# Patient Record
Sex: Male | Born: 1955
Health system: Southern US, Community
[De-identification: ages and names within clinical notes are randomized; demographics above are authoritative.]

## PROBLEM LIST (undated history)

## (undated) DIAGNOSIS — I350 Nonrheumatic aortic (valve) stenosis: Secondary | ICD-10-CM

## (undated) DIAGNOSIS — G4733 Obstructive sleep apnea (adult) (pediatric): Secondary | ICD-10-CM

## (undated) DIAGNOSIS — I2510 Atherosclerotic heart disease of native coronary artery without angina pectoris: Secondary | ICD-10-CM

## (undated) DIAGNOSIS — R0602 Shortness of breath: Secondary | ICD-10-CM

## (undated) DIAGNOSIS — F4310 Post-traumatic stress disorder, unspecified: Secondary | ICD-10-CM

## (undated) DIAGNOSIS — E7800 Pure hypercholesterolemia, unspecified: Secondary | ICD-10-CM

## (undated) DIAGNOSIS — F32A Depression, unspecified: Secondary | ICD-10-CM

## (undated) DIAGNOSIS — E119 Type 2 diabetes mellitus without complications: Secondary | ICD-10-CM

## (undated) DIAGNOSIS — I10 Essential (primary) hypertension: Secondary | ICD-10-CM

## (undated) DIAGNOSIS — F329 Major depressive disorder, single episode, unspecified: Secondary | ICD-10-CM

## (undated) DIAGNOSIS — G4730 Sleep apnea, unspecified: Secondary | ICD-10-CM

## (undated) DIAGNOSIS — R053 Chronic cough: Secondary | ICD-10-CM

## (undated) DIAGNOSIS — J449 Chronic obstructive pulmonary disease, unspecified: Secondary | ICD-10-CM

## (undated) DIAGNOSIS — F419 Anxiety disorder, unspecified: Secondary | ICD-10-CM

## (undated) DIAGNOSIS — I77819 Aortic ectasia, unspecified site: Secondary | ICD-10-CM

## (undated) DIAGNOSIS — R05 Cough: Secondary | ICD-10-CM

## (undated) DIAGNOSIS — M1990 Unspecified osteoarthritis, unspecified site: Secondary | ICD-10-CM

## (undated) DIAGNOSIS — D649 Anemia, unspecified: Secondary | ICD-10-CM

## (undated) DIAGNOSIS — J45909 Unspecified asthma, uncomplicated: Secondary | ICD-10-CM

## (undated) DIAGNOSIS — R079 Chest pain, unspecified: Secondary | ICD-10-CM

## (undated) DIAGNOSIS — D126 Benign neoplasm of colon, unspecified: Secondary | ICD-10-CM

## (undated) DIAGNOSIS — I5032 Chronic diastolic (congestive) heart failure: Secondary | ICD-10-CM

## (undated) DIAGNOSIS — L03119 Cellulitis of unspecified part of limb: Secondary | ICD-10-CM

## (undated) DIAGNOSIS — L03115 Cellulitis of right lower limb: Secondary | ICD-10-CM

## (undated) DIAGNOSIS — Z9889 Other specified postprocedural states: Secondary | ICD-10-CM

## (undated) DIAGNOSIS — I77810 Thoracic aortic ectasia: Secondary | ICD-10-CM

## (undated) DIAGNOSIS — R569 Unspecified convulsions: Secondary | ICD-10-CM

## (undated) HISTORY — DX: Cellulitis of right lower limb: L03.115

## (undated) HISTORY — DX: Cellulitis of unspecified part of limb: L03.119

## (undated) HISTORY — DX: Obstructive sleep apnea (adult) (pediatric): G47.33

## (undated) HISTORY — DX: Unspecified asthma, uncomplicated: J45.909

## (undated) HISTORY — DX: Shortness of breath: R06.02

## (undated) HISTORY — DX: Pure hypercholesterolemia, unspecified: E78.00

## (undated) HISTORY — DX: Anemia, unspecified: D64.9

## (undated) HISTORY — DX: Benign neoplasm of colon, unspecified: D12.6

## (undated) HISTORY — DX: Thoracic aortic ectasia: I77.810

## (undated) HISTORY — PX: UMBILICAL HERNIA REPAIR: SHX196

## (undated) HISTORY — DX: Aortic ectasia, unspecified site: I77.819

## (undated) HISTORY — PX: JOINT REPLACEMENT: SHX530

## (undated) HISTORY — DX: Unspecified convulsions: R56.9

## (undated) HISTORY — DX: Atherosclerotic heart disease of native coronary artery without angina pectoris: I25.10

## (undated) HISTORY — PX: KNEE ARTHROSCOPY: SUR90

## (undated) HISTORY — DX: Chronic obstructive pulmonary disease, unspecified: J44.9

## (undated) HISTORY — DX: Chest pain, unspecified: R07.9

## (undated) HISTORY — DX: Nonrheumatic aortic (valve) stenosis: I35.0

---

## 2005-08-08 DIAGNOSIS — D126 Benign neoplasm of colon, unspecified: Secondary | ICD-10-CM

## 2005-08-08 HISTORY — DX: Benign neoplasm of colon, unspecified: D12.6

## 2014-08-21 ENCOUNTER — Ambulatory Visit
Admission: RE | Admit: 2014-08-21 | Discharge: 2014-08-21 | Disposition: A | Discharge status: Home / Self Care | Payer: BLUE CROSS/BLUE SHIELD | Source: Ambulatory Visit | Attending: Internal Medicine | Admitting: Internal Medicine

## 2014-08-21 ENCOUNTER — Other Ambulatory Visit: Payer: Self-pay | Admitting: Internal Medicine

## 2014-08-21 DIAGNOSIS — M79662 Pain in left lower leg: Secondary | ICD-10-CM

## 2016-08-07 ENCOUNTER — Encounter (HOSPITAL_COMMUNITY): Payer: Self-pay

## 2016-08-07 ENCOUNTER — Emergency Department (HOSPITAL_COMMUNITY)
Admission: EM | Admit: 2016-08-07 | Discharge: 2016-08-07 | Disposition: A | Discharge status: Home / Self Care | Payer: BLUE CROSS/BLUE SHIELD | Attending: Emergency Medicine | Admitting: Emergency Medicine

## 2016-08-07 DIAGNOSIS — I10 Essential (primary) hypertension: Secondary | ICD-10-CM | POA: Insufficient documentation

## 2016-08-07 DIAGNOSIS — Z96652 Presence of left artificial knee joint: Secondary | ICD-10-CM | POA: Diagnosis not present

## 2016-08-07 DIAGNOSIS — R197 Diarrhea, unspecified: Secondary | ICD-10-CM | POA: Diagnosis present

## 2016-08-07 DIAGNOSIS — Z87891 Personal history of nicotine dependence: Secondary | ICD-10-CM | POA: Insufficient documentation

## 2016-08-07 HISTORY — DX: Essential (primary) hypertension: I10

## 2016-08-07 HISTORY — DX: Major depressive disorder, single episode, unspecified: F32.9

## 2016-08-07 HISTORY — DX: Anxiety disorder, unspecified: F41.9

## 2016-08-07 HISTORY — DX: Unspecified osteoarthritis, unspecified site: M19.90

## 2016-08-07 HISTORY — DX: Post-traumatic stress disorder, unspecified: F43.10

## 2016-08-07 HISTORY — DX: Depression, unspecified: F32.A

## 2016-08-07 LAB — I-STAT CHEM 8, ED
BUN: 11 mg/dL (ref 6–20)
Calcium, Ion: 1.03 mmol/L — ABNORMAL LOW (ref 1.15–1.40)
Chloride: 100 mmol/L — ABNORMAL LOW (ref 101–111)
Creatinine, Ser: 0.7 mg/dL (ref 0.61–1.24)
Glucose, Bld: 103 mg/dL — ABNORMAL HIGH (ref 65–99)
HCT: 43 % (ref 39.0–52.0)
Hemoglobin: 14.6 g/dL (ref 13.0–17.0)
Potassium: 4.3 mmol/L (ref 3.5–5.1)
Sodium: 136 mmol/L (ref 135–145)
TCO2: 25 mmol/L (ref 0–100)

## 2016-08-07 MED ORDER — LOPERAMIDE HCL 2 MG PO CAPS
4.0000 mg | ORAL_CAPSULE | Freq: Once | ORAL | Status: AC
  Administered 2016-08-07: 4 mg via ORAL
  Filled 2016-08-07: qty 2

## 2016-08-07 MED ORDER — ONDANSETRON 4 MG PO TBDP
8.0000 mg | ORAL_TABLET | Freq: Once | ORAL | Status: AC
  Administered 2016-08-07: 8 mg via ORAL
  Filled 2016-08-07: qty 2

## 2016-08-07 MED ORDER — FAMOTIDINE 20 MG PO TABS
20.0000 mg | ORAL_TABLET | Freq: Once | ORAL | Status: AC
  Administered 2016-08-07: 20 mg via ORAL
  Filled 2016-08-07: qty 1

## 2016-08-07 MED ORDER — ALUM & MAG HYDROXIDE-SIMETH 200-200-20 MG/5ML PO SUSP
30.0000 mL | Freq: Once | ORAL | Status: AC
  Administered 2016-08-07: 30 mL via ORAL
  Filled 2016-08-07: qty 30

## 2016-08-07 NOTE — ED Notes (Signed)
Pt had 2 episodes of diarrhea .  °

## 2016-08-07 NOTE — ED Provider Notes (Addendum)
[facility] DEPT Provider Note   CSN: [id number] Arrival date & time: 08/07/16  1233     History   Chief Complaint Chief Complaint  Patient presents with  . Diarrhea    HPI Lawrence [name] is a 60 y.o. male.  Patient with diarrhea onset yesterday. States had loose/watery stool q 1-2 hrs yesterday, less frequent today. Notes mild stomach upset, and nausea. No vomiting.  No constant and/or focal abdominal pain. No fever or chills. No dysuria or gu c/o. States ate some broccoli and cheese soup yesterday before symptom onset - is unsure if related. No known ill contacts. No recent new meds, or recent abx use.    The history is provided by the patient.  Diarrhea   Pertinent negatives include no abdominal pain, no chills, no headaches and no cough.    Past Medical History:  Diagnosis Date  . Anxiety   . Arthritis   . Depression   . Hypertension   . PTSD (post-traumatic stress disorder)     There are no active problems to display for this patient.   Past Surgical History:  Procedure Laterality Date  . HERNIA REPAIR    . JOINT REPLACEMENT     left knee replacement x 2  . KNEE ARTHROSCOPY         Home Medications    Prior to Admission medications   Not on File    Family History History reviewed. No pertinent family history.  Social History Social History  Substance Use Topics  . Smoking status: Former Smoker    Quit date: 03/07/2016  . Smokeless tobacco: Never Used  . Alcohol use Yes     Comment: rare     Allergies   Patient has no known allergies.   Review of Systems Review of Systems  Constitutional: Negative for chills and fever.  HENT: Negative for sore throat.   Eyes: Negative for redness.  Respiratory: Negative for cough and shortness of breath.   Cardiovascular: Negative for chest pain.  Gastrointestinal: Positive for diarrhea. Negative for abdominal pain.  Genitourinary: Negative for dysuria.  Musculoskeletal: Negative for back pain  and neck pain.  Skin: Negative for rash.  Neurological: Negative for headaches.  Hematological: Does not bruise/bleed easily.  Psychiatric/Behavioral: Negative for confusion.     Physical Exam Updated Vital Signs BP 142/95   Pulse 89   Temp 98.1 F (36.7 C) (Oral)   Resp 16   Ht 6\' 1"  (1.854 m)   Wt (!) 161.5 kg   SpO2 95%   BMI 46.98 kg/m   Physical Exam  Constitutional: He appears well-developed and well-nourished. No distress.  HENT:  Mouth/Throat: Oropharynx is clear and moist.  Eyes: Conjunctivae are normal.  Neck: Neck supple. No tracheal deviation present.  Cardiovascular: Normal rate, regular rhythm, normal heart sounds and intact distal pulses.   Pulmonary/Chest: Effort normal and breath sounds normal. No accessory muscle usage. No respiratory distress.  Abdominal: Soft. Bowel sounds are normal. He exhibits no distension. There is no tenderness.  Genitourinary:  Genitourinary Comments: No cva tenderness  Musculoskeletal: He exhibits no edema.  Neurological: He is alert.  Skin: Skin is warm and dry. He is not diaphoretic.  Psychiatric: He has a normal mood and affect.  Nursing note and vitals reviewed.    ED Treatments / Results  Labs (all labs ordered are listed, but only abnormal results are displayed) Results for orders placed or performed during the hospital encounter of 08/07/16  I-stat chem 8, ed  Result  Value Ref Range   Sodium 136 135 - 145 mmol/L   Potassium 4.3 3.5 - 5.1 mmol/L   Chloride 100 (L) 101 - 111 mmol/L   BUN 11 6 - 20 mg/dL   Creatinine, Ser 0.70 0.61 - 1.24 mg/dL   Glucose, Bld 103 (H) 65 - 99 mg/dL   Calcium, Ion 1.03 (L) 1.15 - 1.40 mmol/L   TCO2 25 0 - 100 mmol/L   Hemoglobin 14.6 13.0 - 17.0 g/dL   HCT 43.0 39.0 - 52.0 %    EKG  EKG Interpretation None       Radiology No results found.  Procedures Procedures (including critical care time)  Medications Ordered in ED Medications  ondansetron (ZOFRAN-ODT)  disintegrating tablet 8 mg (not administered)  famotidine (PEPCID) tablet 20 mg (not administered)  alum & mag hydroxide-simeth (MAALOX/MYLANTA) 200-200-20 MG/5ML suspension 30 mL (not administered)     Initial Impression / Assessment and Plan / ED Course  I have reviewed the triage vital signs and the nursing notes.  Pertinent labs & imaging results that were available during my care of the patient were reviewed by me and considered in my medical decision making (see chart for details).  Clinical Course    zofran po. pepcid and maalox.  Po fluids.  Pt tolerates po fluids well.  abd is benign, soft and non tender.   Patient currently appears stable for d/c.   Pt tolerating po fluids well.  No abd pain.     Final Clinical Impressions(s) / ED Diagnoses   Final diagnoses:  None    New Prescriptions New Prescriptions   No medications on file     Lajean Saver, MD 08/07/16 [place], MD 08/07/16 [phone]

## 2016-08-07 NOTE — Discharge Instructions (Signed)
It was our pleasure to provide your ER care today - we hope that you feel better.  Rest. Drink plenty of fluids.  You may take imodium as need.   Follow up with primary care doctor this Tuesday if symptoms fail to improve/resolve.  Return to ER if worse, persistent vomiting, new or severe abdominal pain, weak/faint, other concern.

## 2016-08-07 NOTE — ED Triage Notes (Signed)
Onset 2 days ago after eating broccoli and cheese soup has had diarrhea.  Had stool every yesterday, has had 3-4 loose, light brown stools today.  No fever, vomiting.

## 2016-08-07 NOTE — ED Notes (Signed)
Pt drinking fluids well

## 2016-09-25 ENCOUNTER — Emergency Department (HOSPITAL_COMMUNITY)
Admission: EM | Admit: 2016-09-25 | Discharge: 2016-09-25 | Disposition: A | Discharge status: Home / Self Care | Payer: BLUE CROSS/BLUE SHIELD | Attending: Emergency Medicine | Admitting: Emergency Medicine

## 2016-09-25 ENCOUNTER — Encounter (HOSPITAL_COMMUNITY): Payer: Self-pay | Admitting: Emergency Medicine

## 2016-09-25 DIAGNOSIS — M62838 Other muscle spasm: Secondary | ICD-10-CM | POA: Diagnosis not present

## 2016-09-25 DIAGNOSIS — Z79899 Other long term (current) drug therapy: Secondary | ICD-10-CM | POA: Insufficient documentation

## 2016-09-25 DIAGNOSIS — Z96651 Presence of right artificial knee joint: Secondary | ICD-10-CM | POA: Diagnosis not present

## 2016-09-25 DIAGNOSIS — I10 Essential (primary) hypertension: Secondary | ICD-10-CM | POA: Diagnosis not present

## 2016-09-25 DIAGNOSIS — M79652 Pain in left thigh: Secondary | ICD-10-CM | POA: Diagnosis present

## 2016-09-25 DIAGNOSIS — Z87891 Personal history of nicotine dependence: Secondary | ICD-10-CM | POA: Insufficient documentation

## 2016-09-25 HISTORY — DX: Cough: R05

## 2016-09-25 HISTORY — DX: Sleep apnea, unspecified: G47.30

## 2016-09-25 HISTORY — DX: Chronic cough: R05.3

## 2016-09-25 MED ORDER — METHOCARBAMOL 500 MG PO TABS: 500.0000 mg | ORAL_TABLET | Freq: Every evening | ORAL | 0 refills | Status: DC | PRN

## 2016-09-25 MED ORDER — METHOCARBAMOL 500 MG PO TABS
500.0000 mg | ORAL_TABLET | Freq: Once | ORAL | Status: AC
  Administered 2016-09-25: 500 mg via ORAL
  Filled 2016-09-25: qty 1

## 2016-09-25 NOTE — ED Notes (Signed)
Pt states a friend will pick them up.

## 2016-09-25 NOTE — ED Provider Notes (Signed)
[facility] DEPT Provider Note   CSN: [id number] Arrival date & time: 09/25/16  1352  By signing my name below, I, Sonum Patel, attest that this documentation has been prepared under the direction and in the presence of [employer] Electronically Signed: Sonum Patel, Education administrator. 09/25/16. 3:05 PM.  History   Chief Complaint Chief Complaint  Patient presents with  . Leg Pain   The history is provided by the patient. No language interpreter was used.     HPI Comments: Lawrence [name] is a 61 y.o. male who presents to the Emergency Department complaining of constant, unchanged left posterior upper thigh pain that began upon waking this morning. He describes the pain as a squeezing sensation. He has a history of arthritis but states this is different. He has tried Tylenol without relief. He states the pain is worse with standing and eased with lying down. He denies back pain, numbness, paresthesia, weakness. He denies recent immobilization or long distance travel, no rectal pain or tenderness. He denies personal history of blood clots.   Past Medical History:  Diagnosis Date  . Anxiety   . Arthritis   . Chronic cough   . Depression   . Hypertension   . PTSD (post-traumatic stress disorder)   . Sleep apnea     There are no active problems to display for this patient.   Past Surgical History:  Procedure Laterality Date  . HERNIA REPAIR    . JOINT REPLACEMENT     left knee replacement x 2  . KNEE ARTHROSCOPY         Home Medications    Prior to Admission medications   Medication Sig Start Date End Date Taking? Authorizing Provider  methocarbamol (ROBAXIN) 500 MG tablet Take 1 tablet (500 mg total) by mouth at bedtime as needed for muscle spasms. 09/25/16   Emeline General, PA-C    Family History No family history on file.  Social History Social History  Substance Use Topics  . Smoking status: Former Smoker    Quit date: 03/07/2016  . Smokeless tobacco:  Never Used  . Alcohol use Yes     Comment: rare     Allergies   Patient has no known allergies.   Review of Systems Review of Systems  Musculoskeletal: Positive for myalgias. Negative for back pain.  Neurological: Negative for weakness and numbness.     Physical Exam Updated Vital Signs BP 128/80 (BP Location: Left Arm)   Pulse 100   Temp 98 F (36.7 C) (Oral)   Resp 18   Ht 6\' 1"  (1.854 m)   Wt (!) 158.8 kg   SpO2 95%   BMI 46.18 kg/m   Physical Exam  Constitutional: He is oriented to person, place, and time. He appears well-developed and well-nourished. No distress.  Patient is afebrile, non-toxic appearing, sitting comfortably in chair in no acute distress.   HENT:  Head: Normocephalic and atraumatic.  Neck: Normal range of motion.  Cardiovascular: Normal rate, regular rhythm, normal heart sounds and intact distal pulses.   Pulmonary/Chest: Effort normal and breath sounds normal. No respiratory distress. He has no wheezes. He has no rales.  Abdominal: He exhibits no distension. There is no tenderness.  Musculoskeletal: He exhibits tenderness. He exhibits no edema or deformity.  Tenderness to the distal gluteal muscle on the left side. Equal strength bilaterally. Reduced ROM at flexion of left knee. Able to stand from seated position. Strong pulses. Neurovascularly intact bilaterally. No sciatica symptoms.   Neurological:  He is alert and oriented to person, place, and time. He has normal strength. No sensory deficit. He exhibits normal muscle tone. Coordination normal.  NVI bilaterally   Skin: Skin is warm and dry. No rash noted. He is not diaphoretic. No erythema. No pallor.  Psychiatric: He has a normal mood and affect.  Nursing note and vitals reviewed.    ED Treatments / Results  DIAGNOSTIC STUDIES: Oxygen Saturation is 95% on RA, adequate by my interpretation.    COORDINATION OF CARE: 3:05 PM Discussed treatment plan with pt at bedside and pt agreed to  plan.   Labs (all labs ordered are listed, but only abnormal results are displayed) Labs Reviewed - No data to display  EKG  EKG Interpretation None       Radiology No results found.  Procedures Procedures (including critical care time)  Medications Ordered in ED Medications - No data to display   Initial Impression / Assessment and Plan / ED Course  I have reviewed the triage vital signs and the nursing notes.  Pertinent labs & imaging results that were available during my care of the patient were reviewed by me and considered in my medical decision making (see chart for details).      Patient presents with sudden onset muscle spasm/cramp of the left gluteal muscle. The discomfort is limited to the distal gluteal muscle. No sciatica or rectal pain or mass. No rash or ecchymosis noted on exam. No concerns for rectal abscess. No numbness, tingling or focal neuro deficits noted.  Patient wanted to wait to take any medications until he was home as he needs to go down some stair and they felt safer holding until then.  Discharge home with muscle relaxer and close PCP follow up. Patient already has an appointment at [facility] in a few days. Patient will be discharged home & is agreeable with above plan. Pt appears safe for discharge.   Discussed strict return precautions. Patient was advised to return to the emergency department if experiencing any new or worsening symptoms. Patient clearly understood instructions and agreed with discharge plan.   Patient was discussed with Dr. Johnney Killian who agrees with assessment and plan.  Final Clinical Impressions(s) / ED Diagnoses   Final diagnoses:  Muscle spasm    New Prescriptions New Prescriptions   METHOCARBAMOL (ROBAXIN) 500 MG TABLET    Take 1 tablet (500 mg total) by mouth at bedtime as needed for muscle spasms.   I personally performed the services described in this documentation, which was scribed in my presence. The recorded  information has been reviewed and is accurate.    Emeline General, PA-C 09/25/16 1553    [name], MD 09/28/16 1135

## 2016-09-25 NOTE — ED Triage Notes (Signed)
Patient states he woke up and had a muscle cramp left posterior thigh. Patient states he took Tylenol Arthritis at 1100 today with no relief.

## 2016-09-25 NOTE — Discharge Instructions (Signed)
As discussed, do not take this medication if you are driving or operating any machinery. Keep yourself well-hydrated. Follow up with your primary care provider at your appointment in a few days. In the meantime, if you experience worsening of symptoms return to the emergency department.

## 2016-10-18 ENCOUNTER — Other Ambulatory Visit: Payer: Self-pay | Admitting: Family Medicine

## 2016-10-19 ENCOUNTER — Other Ambulatory Visit: Payer: Self-pay | Admitting: Family Medicine

## 2016-11-04 ENCOUNTER — Other Ambulatory Visit: Payer: Self-pay | Admitting: Family Medicine

## 2016-11-04 DIAGNOSIS — M545 Low back pain, unspecified: Secondary | ICD-10-CM

## 2016-11-13 ENCOUNTER — Ambulatory Visit
Admission: RE | Admit: 2016-11-13 | Discharge: 2016-11-13 | Disposition: A | Discharge status: Home / Self Care | Payer: BLUE CROSS/BLUE SHIELD | Source: Ambulatory Visit | Attending: Family Medicine | Admitting: Family Medicine

## 2016-11-13 DIAGNOSIS — M545 Low back pain, unspecified: Secondary | ICD-10-CM

## 2017-01-31 ENCOUNTER — Inpatient Hospital Stay (HOSPITAL_COMMUNITY)
Admission: EM | Admit: 2017-01-31 | Discharge: 2017-02-07 | DRG: 603 | Disposition: A | Discharge status: Skilled Nursing Facility | Payer: Non-veteran care | Attending: Family Medicine | Admitting: Family Medicine

## 2017-01-31 ENCOUNTER — Emergency Department (HOSPITAL_COMMUNITY): Payer: Non-veteran care

## 2017-01-31 ENCOUNTER — Encounter (HOSPITAL_COMMUNITY): Payer: Self-pay | Admitting: Emergency Medicine

## 2017-01-31 DIAGNOSIS — R601 Generalized edema: Secondary | ICD-10-CM

## 2017-01-31 DIAGNOSIS — I080 Rheumatic disorders of both mitral and aortic valves: Secondary | ICD-10-CM | POA: Diagnosis present

## 2017-01-31 DIAGNOSIS — L539 Erythematous condition, unspecified: Secondary | ICD-10-CM | POA: Diagnosis present

## 2017-01-31 DIAGNOSIS — L03116 Cellulitis of left lower limb: Secondary | ICD-10-CM | POA: Diagnosis present

## 2017-01-31 DIAGNOSIS — I50810 Right heart failure, unspecified: Secondary | ICD-10-CM | POA: Diagnosis present

## 2017-01-31 DIAGNOSIS — F329 Major depressive disorder, single episode, unspecified: Secondary | ICD-10-CM | POA: Diagnosis present

## 2017-01-31 DIAGNOSIS — R21 Rash and other nonspecific skin eruption: Secondary | ICD-10-CM | POA: Diagnosis present

## 2017-01-31 DIAGNOSIS — F4310 Post-traumatic stress disorder, unspecified: Secondary | ICD-10-CM | POA: Diagnosis present

## 2017-01-31 DIAGNOSIS — M1711 Unilateral primary osteoarthritis, right knee: Secondary | ICD-10-CM | POA: Diagnosis present

## 2017-01-31 DIAGNOSIS — Z87891 Personal history of nicotine dependence: Secondary | ICD-10-CM | POA: Diagnosis not present

## 2017-01-31 DIAGNOSIS — J449 Chronic obstructive pulmonary disease, unspecified: Secondary | ICD-10-CM | POA: Diagnosis present

## 2017-01-31 DIAGNOSIS — M79609 Pain in unspecified limb: Secondary | ICD-10-CM | POA: Diagnosis not present

## 2017-01-31 DIAGNOSIS — Z96652 Presence of left artificial knee joint: Secondary | ICD-10-CM | POA: Diagnosis present

## 2017-01-31 DIAGNOSIS — Z823 Family history of stroke: Secondary | ICD-10-CM | POA: Diagnosis not present

## 2017-01-31 DIAGNOSIS — Z79899 Other long term (current) drug therapy: Secondary | ICD-10-CM

## 2017-01-31 DIAGNOSIS — L03119 Cellulitis of unspecified part of limb: Secondary | ICD-10-CM | POA: Diagnosis not present

## 2017-01-31 DIAGNOSIS — E66813 Obesity, class 3: Secondary | ICD-10-CM

## 2017-01-31 DIAGNOSIS — R079 Chest pain, unspecified: Secondary | ICD-10-CM | POA: Diagnosis not present

## 2017-01-31 DIAGNOSIS — R600 Localized edema: Secondary | ICD-10-CM | POA: Diagnosis not present

## 2017-01-31 DIAGNOSIS — K529 Noninfective gastroenteritis and colitis, unspecified: Secondary | ICD-10-CM | POA: Diagnosis present

## 2017-01-31 DIAGNOSIS — G4733 Obstructive sleep apnea (adult) (pediatric): Secondary | ICD-10-CM

## 2017-01-31 DIAGNOSIS — G8929 Other chronic pain: Secondary | ICD-10-CM | POA: Diagnosis present

## 2017-01-31 DIAGNOSIS — Z8249 Family history of ischemic heart disease and other diseases of the circulatory system: Secondary | ICD-10-CM | POA: Diagnosis not present

## 2017-01-31 DIAGNOSIS — Z66 Do not resuscitate: Secondary | ICD-10-CM | POA: Diagnosis present

## 2017-01-31 DIAGNOSIS — R319 Hematuria, unspecified: Secondary | ICD-10-CM

## 2017-01-31 DIAGNOSIS — Z6841 Body Mass Index (BMI) 40.0 and over, adult: Secondary | ICD-10-CM | POA: Diagnosis not present

## 2017-01-31 DIAGNOSIS — I110 Hypertensive heart disease with heart failure: Secondary | ICD-10-CM | POA: Diagnosis present

## 2017-01-31 DIAGNOSIS — M7989 Other specified soft tissue disorders: Secondary | ICD-10-CM | POA: Diagnosis present

## 2017-01-31 HISTORY — DX: Localized edema: R60.0

## 2017-01-31 HISTORY — DX: Generalized edema: R60.1

## 2017-01-31 LAB — CBC WITH DIFFERENTIAL/PLATELET
Basophils Absolute: 0 10*3/uL (ref 0.0–0.1)
Basophils Relative: 0 %
Eosinophils Absolute: 1.3 10*3/uL — ABNORMAL HIGH (ref 0.0–0.7)
Eosinophils Relative: 10 %
HCT: 40.4 % (ref 39.0–52.0)
Hemoglobin: 13 g/dL (ref 13.0–17.0)
Lymphocytes Relative: 9 %
Lymphs Abs: 1.1 10*3/uL (ref 0.7–4.0)
MCH: 28.8 pg (ref 26.0–34.0)
MCHC: 32.2 g/dL (ref 30.0–36.0)
MCV: 89.4 fL (ref 78.0–100.0)
Monocytes Absolute: 0.6 10*3/uL (ref 0.1–1.0)
Monocytes Relative: 5 %
Neutro Abs: 9.7 10*3/uL — ABNORMAL HIGH (ref 1.7–7.7)
Neutrophils Relative %: 76 %
Platelets: 260 10*3/uL (ref 150–400)
RBC: 4.52 MIL/uL (ref 4.22–5.81)
RDW: 14.9 % (ref 11.5–15.5)
WBC: 12.7 10*3/uL — ABNORMAL HIGH (ref 4.0–10.5)

## 2017-01-31 LAB — COMPREHENSIVE METABOLIC PANEL
ALT: 22 U/L (ref 17–63)
AST: 21 U/L (ref 15–41)
Albumin: 3.6 g/dL (ref 3.5–5.0)
Alkaline Phosphatase: 63 U/L (ref 38–126)
Anion gap: 12 (ref 5–15)
BUN: 27 mg/dL — ABNORMAL HIGH (ref 6–20)
CO2: 26 mmol/L (ref 22–32)
Calcium: 9 mg/dL (ref 8.9–10.3)
Chloride: 95 mmol/L — ABNORMAL LOW (ref 101–111)
Creatinine, Ser: 1.16 mg/dL (ref 0.61–1.24)
GFR calc Af Amer: >60 mL/min (ref >60)
GFR calc non Af Amer: >60 mL/min (ref >60)
Glucose, Bld: 138 mg/dL — ABNORMAL HIGH (ref 65–99)
Potassium: 3.9 mmol/L (ref 3.5–5.1)
Sodium: 133 mmol/L — ABNORMAL LOW (ref 135–145)
Total Bilirubin: 0.5 mg/dL (ref 0.3–1.2)
Total Protein: 6.8 g/dL (ref 6.5–8.1)

## 2017-01-31 LAB — I-STAT TROPONIN, ED: Troponin i, poc: 0 ng/mL (ref 0.00–0.08)

## 2017-01-31 LAB — BRAIN NATRIURETIC PEPTIDE: B Natriuretic Peptide: 17.8 pg/mL (ref 0.0–100.0)

## 2017-01-31 MED ORDER — VANCOMYCIN HCL IN DEXTROSE 1-5 GM/200ML-% IV SOLN: 1000.0000 mg | Freq: Once | INTRAVENOUS | Status: DC

## 2017-01-31 MED ORDER — NYSTATIN 100000 UNIT/GM EX POWD
Freq: Two times a day (BID) | CUTANEOUS | Status: DC
  Administered 2017-02-01: 22:00:00 via TOPICAL
  Filled 2017-01-31: qty 15

## 2017-01-31 MED ORDER — ACETAMINOPHEN 650 MG RE SUPP: 650.0000 mg | Freq: Four times a day (QID) | RECTAL | Status: DC | PRN

## 2017-01-31 MED ORDER — VENLAFAXINE HCL ER 75 MG PO CP24
150.0000 mg | ORAL_CAPSULE | Freq: Every day | ORAL | Status: DC
  Administered 2017-02-01 – 2017-02-07 (×7): 150 mg via ORAL
  Filled 2017-01-31 (×7): qty 2

## 2017-01-31 MED ORDER — FUROSEMIDE 10 MG/ML IJ SOLN
40.0000 mg | Freq: Once | INTRAMUSCULAR | Status: AC
  Administered 2017-02-01: 40 mg via INTRAVENOUS
  Filled 2017-01-31: qty 4

## 2017-01-31 MED ORDER — VANCOMYCIN HCL 10 G IV SOLR
2500.0000 mg | Freq: Once | INTRAVENOUS | Status: AC
  Administered 2017-01-31: 2500 mg via INTRAVENOUS
  Filled 2017-01-31: qty 2500

## 2017-01-31 MED ORDER — TRAZODONE HCL 100 MG PO TABS
100.0000 mg | ORAL_TABLET | Freq: Every day | ORAL | Status: DC
  Administered 2017-01-31 – 2017-02-06 (×7): 100 mg via ORAL
  Filled 2017-01-31 (×7): qty 1

## 2017-01-31 MED ORDER — PRAZOSIN HCL 2 MG PO CAPS
4.0000 mg | ORAL_CAPSULE | Freq: Every day | ORAL | Status: DC
  Administered 2017-01-31 – 2017-02-06 (×7): 4 mg via ORAL
  Filled 2017-01-31 (×8): qty 2

## 2017-01-31 MED ORDER — ARIPIPRAZOLE 5 MG PO TABS
10.0000 mg | ORAL_TABLET | Freq: Every day | ORAL | Status: DC
  Administered 2017-01-31 – 2017-02-06 (×7): 10 mg via ORAL
  Filled 2017-01-31 (×7): qty 2

## 2017-01-31 MED ORDER — DOXYCYCLINE HYCLATE 100 MG PO TABS: 100.0000 mg | ORAL_TABLET | Freq: Two times a day (BID) | ORAL | Status: DC

## 2017-01-31 MED ORDER — ACETAMINOPHEN 325 MG PO TABS
650.0000 mg | ORAL_TABLET | Freq: Four times a day (QID) | ORAL | Status: DC | PRN
  Administered 2017-02-06: 650 mg via ORAL
  Filled 2017-01-31 (×2): qty 2

## 2017-01-31 MED ORDER — LOPERAMIDE HCL 2 MG PO CAPS
2.0000 mg | ORAL_CAPSULE | Freq: Two times a day (BID) | ORAL | Status: DC | PRN
  Administered 2017-01-31 – 2017-02-05 (×2): 2 mg via ORAL
  Filled 2017-01-31 (×2): qty 1

## 2017-01-31 MED ORDER — SODIUM CHLORIDE 0.9% FLUSH
3.0000 mL | Freq: Two times a day (BID) | INTRAVENOUS | Status: DC
  Administered 2017-02-02 – 2017-02-07 (×8): 3 mL via INTRAVENOUS

## 2017-01-31 MED ORDER — LOSARTAN POTASSIUM 25 MG PO TABS
25.0000 mg | ORAL_TABLET | Freq: Every day | ORAL | Status: DC
  Administered 2017-02-01 – 2017-02-07 (×7): 25 mg via ORAL
  Filled 2017-01-31 (×7): qty 1

## 2017-01-31 MED ORDER — SODIUM CHLORIDE 0.9 % IV SOLN: 250.0000 mL | INTRAVENOUS | Status: DC | PRN

## 2017-01-31 MED ORDER — HYDROCHLOROTHIAZIDE 12.5 MG PO CAPS
12.5000 mg | ORAL_CAPSULE | Freq: Every day | ORAL | Status: DC
  Administered 2017-02-01 – 2017-02-07 (×7): 12.5 mg via ORAL
  Filled 2017-01-31 (×7): qty 1

## 2017-01-31 MED ORDER — ENOXAPARIN SODIUM 30 MG/0.3ML ~~LOC~~ SOLN
30.0000 mg | SUBCUTANEOUS | Status: DC
  Administered 2017-01-31: 30 mg via SUBCUTANEOUS
  Filled 2017-01-31: qty 0.3

## 2017-01-31 MED ORDER — ALBUTEROL SULFATE (2.5 MG/3ML) 0.083% IN NEBU: 3.0000 mL | INHALATION_SOLUTION | RESPIRATORY_TRACT | Status: DC | PRN

## 2017-01-31 MED ORDER — FUROSEMIDE 10 MG/ML IJ SOLN
60.0000 mg | Freq: Once | INTRAMUSCULAR | Status: AC
  Administered 2017-01-31: 60 mg via INTRAVENOUS
  Filled 2017-01-31: qty 6

## 2017-01-31 MED ORDER — SODIUM CHLORIDE 0.9% FLUSH
3.0000 mL | Freq: Two times a day (BID) | INTRAVENOUS | Status: DC
  Administered 2017-01-31 – 2017-02-07 (×7): 3 mL via INTRAVENOUS

## 2017-01-31 MED ORDER — LORATADINE 10 MG PO TABS
10.0000 mg | ORAL_TABLET | Freq: Every day | ORAL | Status: DC
  Administered 2017-01-31 – 2017-02-06 (×7): 10 mg via ORAL
  Filled 2017-01-31 (×7): qty 1

## 2017-01-31 MED ORDER — GABAPENTIN 300 MG PO CAPS
600.0000 mg | ORAL_CAPSULE | Freq: Every day | ORAL | Status: DC
  Administered 2017-01-31 – 2017-02-06 (×7): 600 mg via ORAL
  Filled 2017-01-31 (×7): qty 2

## 2017-01-31 MED ORDER — SODIUM CHLORIDE 0.9% FLUSH: 3.0000 mL | INTRAVENOUS | Status: DC | PRN

## 2017-01-31 MED ORDER — GABAPENTIN 300 MG PO CAPS
300.0000 mg | ORAL_CAPSULE | Freq: Every morning | ORAL | Status: DC
  Administered 2017-02-01 – 2017-02-07 (×7): 300 mg via ORAL
  Filled 2017-01-31 (×7): qty 1

## 2017-01-31 MED ORDER — VANCOMYCIN HCL 10 G IV SOLR
1250.0000 mg | Freq: Two times a day (BID) | INTRAVENOUS | Status: DC
  Administered 2017-02-01 – 2017-02-02 (×3): 1250 mg via INTRAVENOUS
  Filled 2017-01-31 (×4): qty 1250

## 2017-01-31 NOTE — Plan of Care (Signed)
Problem: Skin Integrity: Goal: Risk for impaired skin integrity will decrease Outcome: Not Progressing Cellulitis, pts. Legs currently red and inflammed. Sore to touch. Abx started.

## 2017-01-31 NOTE — Progress Notes (Addendum)
Patient was having some CP upon arrival of seeing if patient was ready for CPAP. CPAP was set up and water filled. RN at bedside. Vitals are stable, Tech is getting EKG. RT will check later about patients CPAP and if he wants to still wear. RN placed patient on 2 LPM.

## 2017-01-31 NOTE — ED Notes (Signed)
Report called to [place]

## 2017-01-31 NOTE — H&P (Signed)
[facility] Admission History and Physical Service Pager: [phone]  Patient name: Lawrence [name] Medical record number: [id number] Date of birth: 07-10-1956 Age: 61 y.o. Gender: male  Primary Care Provider: [facility] Consultants: none Code Status: DNR  Chief Complaint: leg swelling  Assessment and Plan: [name] is a 61 y.o. male presenting with swelling up to abdomen . PMH is significant for COPD, HTN, morbid obesity, OA (R knee, s/p replacement of L knee), OSA, PTSD, Depression, anxiety.   LE edema- concerning for CHF, however BNP normal at 17.8. Differential includes lymphedema, chronic venous insufficiency, or resultant of untreated severe OSA. No prior Echo per patient. CXR demonstrates cardiomegaly and vascular congestion, no concerns for pneumonia or pleural effusions. Patient thinks his dry weight is around 374 before all this began (January) however no weight in ED today so unclear where he is.  -place in observation, attending Dr. Mingo Amber -monitor on telemetry -vitals per floor -weight patient on admission -up with assistance -obtain Echo -continue IV diuretics with 40mg  IV lasix BID -strict I&O -daily weights -PT/OT consults -obtain ABI's -elevate scrotum   Purulent LE Cellulitis- secondary to edema. No documented fevers but endorses chills. WBC elevated to 12.7. Concern for bacterial infection with possible fungal component.  -consider c/s to wound care -start patient on po doxy 200 mg BID for cellulitis -topical nystatin powder to legs bilaterally BID -follow up am CBC, trend WBC, monitor for fevers  COPD- recent diagnosis, 45 pack year smoking history. No current medications. Reports albuterol in past has not helped breathing. Not in current exacerbation. -albuterol q4 as needed -consider starting controller medication this hospitalization  HTN- on HCTZ-lisinopril 12.5-10mg  daily. BP in ED. Patient reports dry cough  that roughly coincides with start of ACE inhibitor -continue HCTZ -switch ACEi to ARB due to cough -monitor BP  Allergies- on zyrtec 10 mg qhs -continue antihistamine  Chronic pain- on gabapentin 600 mg every night and 300 mg every morning, reports this is for leg pain -continue gabapentin  Nightmares- on Prazosin 2 mg two capsules every night -continue home medication  OSA- supposed to be on CPAP but mask ill-fitting at home and may be contributing to nightmares -CPAP qhs  Chronic diarrhea- takes immodium as needed, more like twice a day.  -continue immodium  PTSD/Depression/Anxiety- on Aripiprazole 10 mg every night, Trazodone 100 mg every night, Venlafaxine 150 mg in am -continue home medication  FEN/GI: heart healthy diet Prophylaxis: lovenox  Disposition: observe on telemetry, unclear disposition at this point  History of Present Illness:  Lawrence [name] is a 61 y.o. male presenting with lower extremity swelling.   Per patient and his wife this has been going on for several months but has been progressively worsening. At first his lower legs were swelling. He was started on oral Lasix 40 mg daily in May. This does cause him to urinate but his legs continued to swell. For past several weeks now his legs have become red and itchy and have started to weep clear fluid. There is some pus visible on legs in certain areas. He went to his regular doctor's check up today and was told he needed to go to ED  His wife has been using eucerin on the areas. He endorses chills. Feels short of breath with exertion, even with just getting dressed. Denies chest pain. Does not sleep well at night. He Is supposed to be on CPAP machine, and he was for years and had been doing well,  but he began having nightmares and came off of this for the past 6 or 7 months. Was going to have sleep study to try and refit mask but broke his ankle in May. He is using crutches now and following with orthopedics for  this. He often falls asleep during the day. He has to sleep with raised with several pillows. Endorses PND. Has a dry cough for past year or so. Was started on HCTZ/lininsopril about 1-1.5 years ago.   Additionally he has had stress test in past and thinks this was normal. Presented to hospital in distant past with possible coronary event in NH but was sent home from ED without any serious findings.   Follows at [facility] for his care.   Review Of Systems: Per HPI with the following additions:   Review of Systems  Constitutional: Positive for chills. Negative for fever.  HENT: Negative for congestion and sore throat.   Eyes: Negative for blurred vision and double vision.  Respiratory: Positive for cough (dry and feels like a tickle) and shortness of breath.   Cardiovascular: Positive for leg swelling. Negative for chest pain.  Gastrointestinal: Negative for abdominal pain, constipation, diarrhea, nausea and vomiting.  Genitourinary: Negative for dysuria.  Musculoskeletal: Positive for falls and joint pain.  Skin: Positive for rash.  Neurological: Positive for weakness (generalized). Negative for focal weakness.  Endo/Heme/Allergies: Positive for environmental allergies.  Psychiatric/Behavioral: Positive for depression. Negative for substance abuse.    Patient Active Problem List   Diagnosis Date Noted  . Anasarca 01/31/2017    Past Medical History: Past Medical History:  Diagnosis Date  . Anxiety   . Arthritis   . Chronic cough   . Depression   . Hypertension   . PTSD (post-traumatic stress disorder)   . Sleep apnea    Past Surgical History: Past Surgical History:  Procedure Laterality Date  . HERNIA REPAIR    . JOINT REPLACEMENT     left knee replacement x 2  . KNEE ARTHROSCOPY      Social History: Social History  Substance Use Topics  . Smoking status: Former Smoker    Quit date: 03/07/2016  . Smokeless tobacco: Never Used  . Alcohol use Yes     Comment: rare    Additional social history: Smoked from 61 years of age to last year, about 1 PPD.  Please also refer to relevant sections of EMR.  Family History: No family history on file. CHF - grandparents on mom's side CVA - mother Blood clots run in family per patient Siblings with CABG (brother), blood clots in leg (sister)  Allergies and Medications: No Known Allergies No current facility-administered medications on file prior to encounter.    Current Outpatient Prescriptions on File Prior to Encounter  Medication Sig Dispense Refill  . methocarbamol (ROBAXIN) 500 MG tablet Take 1 tablet (500 mg total) by mouth at bedtime as needed for muscle spasms. 12 tablet 0   Objective: BP (!) 128/59 (BP Location: Left Arm)   Pulse 92   Temp 97.8 F (36.6 C) (Oral)   Resp (!) 22   SpO2 93%  Exam: General: morbidly obese man sitting up in bed in NAD Eyes: PERRL. No scleral icterus or conjunctival injection ENTM: MMM. No erythema or exudate in posterior oropharynx Neck: supple, non-tender, no LAD Cardiovascular: RRR, no MRG. Respiratory: decreased air movement bilaterally in lung bases. No increased WOB.  Gastrointestinal: obese abdomen. Distended. Non-tender. Distant BS. +1 pitting edema on lower abdomen. MSK: lower extremities  edematous bilaterally +2 pitting edema up to thighs bilaterally GU: scrotal swelling present. No erythema or exudate noted. Derm: erythematous weeping chronic skin changes over lower extremities bilaterally  Neuro: CN2-12 in tact. Strength 5/5, sensation in tact throughout Psych: appropriate affect  Labs and Imaging: CBC BMET   Recent Labs Lab 01/31/17 1048  WBC 12.7*  HGB 13.0  HCT 40.4  PLT 260    Recent Labs Lab 01/31/17 1048  NA 133*  K 3.9  CL 95*  CO2 26  BUN 27*  CREATININE 1.16  GLUCOSE 138*  CALCIUM 9.0     Dg Chest 2 View  Result Date: 01/31/2017 CLINICAL DATA:  Four weeks of shortness of breath which has worsened today. Suspect CHF.  EXAM: CHEST  2 VIEW COMPARISON:  None in PACs FINDINGS: The lungs are well-expanded. There is no focal infiltrate. The cardiac silhouette is enlarged. The pulmonary vascularity while mildly prominent centrally do is not clearly cephalized. The mediastinum is normal in width. IMPRESSION: Mild chronic bronchitic-smoking related changes. Cardiomegaly with central pulmonary vascular congestion. No definite pulmonary edema or pneumonia. Electronically Signed   By: David  Martinique M.D.   On: 01/31/2017 13:39    Steve Rattler, DO 01/31/2017, 2:57 PM PGY-1, [facility] pager: [phone], text pages welcome

## 2017-01-31 NOTE — ED Provider Notes (Signed)
[facility] DEPT Provider Note   CSN: [id number] Arrival date & time: 01/31/17  1024     History   Chief Complaint Chief Complaint  Patient presents with  . Weakness    HPI Lawrence [name] is a 61 y.o. male.  HPI Lawrence [name] is a 61 y.o. male with hx of COPD, CHF?, HTN, morbid obesity, presents to ED with complaint of LE swelling. Pt states he has had problems with swelling over the last 6 months. States this is a new problem for him. Pt goes to [place]. States One month ago was started on Lasix. Today he went back for recheck and his legs were looking worse so he was sent here. Patient with bilateral leg swelling up to the abdomen with weeping ulcers. He denies any fever or chills. He reports pain in his legs. He reports some shortness of breath and cough.  Past Medical History:  Diagnosis Date  . Anxiety   . Arthritis   . Chronic cough   . Depression   . Hypertension   . PTSD (post-traumatic stress disorder)   . Sleep apnea     There are no active problems to display for this patient.   Past Surgical History:  Procedure Laterality Date  . HERNIA REPAIR    . JOINT REPLACEMENT     left knee replacement x 2  . KNEE ARTHROSCOPY         Home Medications    Prior to Admission medications   Medication Sig Start Date End Date Taking? Authorizing Provider  methocarbamol (ROBAXIN) 500 MG tablet Take 1 tablet (500 mg total) by mouth at bedtime as needed for muscle spasms. 09/25/16   Emeline General, PA-C    Family History No family history on file.  Social History Social History  Substance Use Topics  . Smoking status: Former Smoker    Quit date: 03/07/2016  . Smokeless tobacco: Never Used  . Alcohol use Yes     Comment: rare     Allergies   Patient has no known allergies.   Review of Systems Review of Systems   Physical Exam Updated Vital Signs BP (!) 143/72   Pulse 95   Temp 97.8 F (36.6 C) (Oral)   Resp (!) 24   SpO2 97%   Physical  Exam   ED Treatments / Results  Labs (all labs ordered are listed, but only abnormal results are displayed) Labs Reviewed  CBC WITH DIFFERENTIAL/PLATELET - Abnormal; Notable for the following:       Result Value   WBC 12.7 (*)    Neutro Abs 9.7 (*)    Eosinophils Absolute 1.3 (*)    All other components within normal limits  COMPREHENSIVE METABOLIC PANEL - Abnormal; Notable for the following:    Sodium 133 (*)    Chloride 95 (*)    Glucose, Bld 138 (*)    BUN 27 (*)    All other components within normal limits  BRAIN NATRIURETIC PEPTIDE  HIV ANTIBODY (ROUTINE TESTING)  TSH  BASIC METABOLIC PANEL  CBC  I-STAT TROPOININ, ED    EKG  EKG Interpretation None       Radiology Dg Chest 2 View  Result Date: 01/31/2017 CLINICAL DATA:  Four weeks of shortness of breath which has worsened today. Suspect CHF. EXAM: CHEST  2 VIEW COMPARISON:  None in PACs FINDINGS: The lungs are well-expanded. There is no focal infiltrate. The cardiac silhouette is enlarged. The pulmonary vascularity while mildly prominent centrally do is  not clearly cephalized. The mediastinum is normal in width. IMPRESSION: Mild chronic bronchitic-smoking related changes. Cardiomegaly with central pulmonary vascular congestion. No definite pulmonary edema or pneumonia. Electronically Signed   By: David  Martinique M.D.   On: 01/31/2017 13:39    Procedures Procedures (including critical care time)  Medications Ordered in ED   Initial Impression / Assessment and Plan / ED Course  I have reviewed the triage vital signs and the nursing notes.  Pertinent labs & imaging results that were available during my care of the patient were reviewed by me and considered in my medical decision making (see chart for details).     Pt in ED with fluid overload, Bilateral lower extremities bilateral lower extremity swelling, with erythema, purulent draining ulcerations. Swelling extends to the abdomen. Currently on Lasix. Will get  labs, chest x-ray, EKG, will administer 60 mg of Lasix IV. Will await patient.   4:57 PM Pt's weight is 427lb/193kg, he believes his normal weight is around 375, but is not sure. Labs showing WBC of 12.7, concerning for cellulitis given erythema and draining ulcers, however could also be from the amount of swelling. BNP 17.8, however CXR showing cardiomegaly and vascular congestion, no edema. Will admit for further evaluation and diuresis.   Spoke with family practice, will admit. Discussed not starting tx for possible cellulitis, will assess.  Vitals:   01/31/17 1533 01/31/17 1545 01/31/17 1636 01/31/17 1652  BP: 137/81 140/71 110/69   Pulse: 92 93 91   Resp: (!) 24 17 18    Temp:   97.9 F (36.6 C)   TempSrc:   Oral   SpO2: 96% 98% 98%   Weight:   (!) 193.8 kg (427 lb 4.8 oz) (!) 193.9 kg (427 lb 8 oz)  Height:    6\' 1"  (1.854 m)     Final Clinical Impressions(s) / ED Diagnoses   Final diagnoses:  Anasarca    New Prescriptions Current Discharge Medication List       Janee Morn 01/31/17 1703    Virgel Manifold, MD 02/11/17 1732

## 2017-01-31 NOTE — ED Triage Notes (Addendum)
Pt from the [place] in [place] and had gone to his appointment  There and dr s noticed more swelling and firmness all over, pitting edema in legs shoulders and abd, pt states this has been going on for 6 months, also has some weeping, redness in extremities, pt mmore sob

## 2017-01-31 NOTE — ED Notes (Signed)
Admitting at bedside 

## 2017-01-31 NOTE — Progress Notes (Signed)
Pharmacy Antibiotic Note  Lawrence [name] is a 61 y.o. male admitted on 01/31/2017 with cellulitis.  Pharmacy has been consulted for vancomycin dosing.  Patient is obese, normalized CrCl ~65-39mL/min.  Plan: -Vancomycin 2500mg  IV x1 as loading dose, then 1250mg  IV q12h -Follow c/s, clinical progression, renal function, level PRN (goal trough 10-80mcg/mL)  Height: 6\' 1"  (185.4 cm) Weight: (!) 427 lb 8 oz (193.9 kg) IBW/kg (Calculated) : 79.9  Temp (24hrs), Avg:97.9 F (36.6 C), Min:97.8 F (36.6 C), Max:97.9 F (36.6 C)   Recent Labs Lab 01/31/17 1048  WBC 12.7*  CREATININE 1.16    Estimated Creatinine Clearance: 121.7 mL/min (by C-G formula based on SCr of 1.16 mg/dL).    No Known Allergies  Antimicrobials this admission: Vancomycin 6/26 >>   Dose adjustments this admission: N/a  Microbiology results: none  Thank you for allowing pharmacy to be a part of this patient's care.  Nyshaun Standage D. Christina Waldrop, PharmD, BCPS Clinical Pharmacist [phone] 01/31/2017 5:37 PM

## 2017-01-31 NOTE — Progress Notes (Signed)
Pt.  c/o sharp, stabbing, L side CP,  does not radiate. VS completed; BP 114/59, HR 110, O2 94% on RA. EKG complete, Pt. on 2LO2. And Now pain free. On call for family medicine teaching service made aware. RN Will continue to monitor pt. For changes in condition.

## 2017-02-01 ENCOUNTER — Encounter (HOSPITAL_COMMUNITY): Payer: BLUE CROSS/BLUE SHIELD

## 2017-02-01 ENCOUNTER — Inpatient Hospital Stay (HOSPITAL_COMMUNITY): Payer: Non-veteran care

## 2017-02-01 DIAGNOSIS — R601 Generalized edema: Secondary | ICD-10-CM

## 2017-02-01 DIAGNOSIS — F4310 Post-traumatic stress disorder, unspecified: Secondary | ICD-10-CM

## 2017-02-01 DIAGNOSIS — R600 Localized edema: Secondary | ICD-10-CM

## 2017-02-01 DIAGNOSIS — E66813 Obesity, class 3: Secondary | ICD-10-CM

## 2017-02-01 LAB — GLUCOSE, CAPILLARY: Glucose-Capillary: 129 mg/dL — ABNORMAL HIGH (ref 65–99)

## 2017-02-01 LAB — BASIC METABOLIC PANEL
Anion gap: 8 (ref 5–15)
BUN: 20 mg/dL (ref 6–20)
CO2: 28 mmol/L (ref 22–32)
Calcium: 8.3 mg/dL — ABNORMAL LOW (ref 8.9–10.3)
Chloride: 98 mmol/L — ABNORMAL LOW (ref 101–111)
Creatinine, Ser: 0.86 mg/dL (ref 0.61–1.24)
GFR calc Af Amer: >60 mL/min (ref >60)
GFR calc non Af Amer: >60 mL/min (ref >60)
Glucose, Bld: 119 mg/dL — ABNORMAL HIGH (ref 65–99)
Potassium: 4 mmol/L (ref 3.5–5.1)
Sodium: 134 mmol/L — ABNORMAL LOW (ref 135–145)

## 2017-02-01 LAB — CBC
HCT: 37.5 % — ABNORMAL LOW (ref 39.0–52.0)
Hemoglobin: 11.9 g/dL — ABNORMAL LOW (ref 13.0–17.0)
MCH: 28.2 pg (ref 26.0–34.0)
MCHC: 31.7 g/dL (ref 30.0–36.0)
MCV: 88.9 fL (ref 78.0–100.0)
Platelets: 255 10*3/uL (ref 150–400)
RBC: 4.22 MIL/uL (ref 4.22–5.81)
RDW: 14.8 % (ref 11.5–15.5)
WBC: 11.4 10*3/uL — ABNORMAL HIGH (ref 4.0–10.5)

## 2017-02-01 LAB — HIV ANTIBODY (ROUTINE TESTING W REFLEX): HIV Screen 4th Generation wRfx: NONREACTIVE

## 2017-02-01 LAB — TROPONIN I
Troponin I: 0.03 ng/mL (ref <0.03)
Troponin I: 0.03 ng/mL (ref <0.03)
Troponin I: 0.03 ng/mL (ref <0.03)
Troponin I: <0.03 ng/mL (ref <0.03)

## 2017-02-01 LAB — TSH: TSH: 1.689 u[IU]/mL (ref 0.350–4.500)

## 2017-02-01 MED ORDER — FUROSEMIDE 10 MG/ML IJ SOLN
40.0000 mg | Freq: Two times a day (BID) | INTRAMUSCULAR | Status: DC
  Administered 2017-02-01 – 2017-02-02 (×3): 40 mg via INTRAVENOUS
  Filled 2017-02-01 (×3): qty 4

## 2017-02-01 MED ORDER — ENOXAPARIN SODIUM 100 MG/ML ~~LOC~~ SOLN
90.0000 mg | SUBCUTANEOUS | Status: DC
  Administered 2017-02-01 – 2017-02-06 (×6): 90 mg via SUBCUTANEOUS
  Filled 2017-02-01 (×6): qty 1

## 2017-02-01 MED ORDER — DIPHENHYDRAMINE HCL 25 MG PO CAPS
25.0000 mg | ORAL_CAPSULE | Freq: Four times a day (QID) | ORAL | Status: DC | PRN
  Administered 2017-02-01 – 2017-02-07 (×11): 25 mg via ORAL
  Filled 2017-02-01 (×11): qty 1

## 2017-02-01 NOTE — Discharge Summary (Signed)
[facility] Discharge Summary  Patient name: Lawrence [name] Medical record number: [id number] Date of birth: 04-18-1956 Age: 61 y.o. Gender: male Date of Admission: 01/31/2017  Date of Discharge: 02/07/2017 Admitting Physician: Alveda Reasons, MD  Primary Care Provider: [facility] Consultants: Wound care, PT/OT, urology  Indication for Hospitalization: LE edema  Discharge Diagnoses/Problem List:  Patient Active Problem List   Diagnosis Date Noted  . Morbid obesity ([place])   . PTSD (post-traumatic stress disorder)   . Anasarca 01/31/2017  . Leg edema 01/31/2017  . Cellulitis of lower extremity   . OSA (obstructive sleep apnea)     Disposition: SNF  Discharge Condition: stable, improved  Discharge Exam:  General: NAD, awake and alert, obese male              Cardiovascular: RRR, 3/6 systolic mumur best heard at left sternal border  Respiratory: wheezing in upper lobes bilaterally, no increased work of breathing Abdomen: obese. Non tender, distended, erythema slightly above previously marked lines     Extremities: ace wraps applied on legs bilaterally, non pitting edema bilaterally above wraps, erythematous rash bilaterally on all 4 extremities   Brief Hospital Course:  Attilio Zeitler is a 61 year old male with PMH of morbid obesity, HTN, COPD, OSA, depression, anxiety, and PTSD who presented to [facility] ED with LE swelling up to abdomen with weeping wounds on LE bilaterally. He was admitted to [facility] for management. He was started on IV Vanc for cellulitis and topical nystatin powder was applied to legs. Vancomycin was discontinued as patient developed a pruritic rash under his arms. Topical bactroban was applied to lower extremities bilaterally BID with improvement. He received an Echo that showed normal EF with Grade 1 Diastolic Dysfunction. ABIs showed normal blood flow bilaterally. CT pelvis was obtained to rule out mass or obstruction  causing LE edema and was negative. PSA was normal. He was diuresed with IV lasix with improvement in volume status. He was transitioned to oral torsemide with good UOP. Patient reported urinary changes of odor and darkening of urine on day of discharge. UA resulted in too numerous to count red and white blood cells with rare bacteria. Urology was consulted, who recommended outpatient follow-up. Renal ultrasound showed no abnormalities. PT/OT evaluated patient and recommended SNF. Patient was amenable and discharged to SNF in stable condition on 02/06/17.   Issues for Follow Up:  1. Continue wound care to bilateral lower extremities: apply bactroban topically BID, re-wrap with ACE for compression BID 2. Lasix 40mg  qd changed to torsemide 20mg  BID prior to discharge.  3. Monitor kidney function on oral torsemide 4. Monitor volume status 5. Continue CPAP at night 6. Patient to follow up with urology outpatient due to gross hematuria. Urology office to contact patient to schedule appt.  7. CPAP settings: 10 cm  Significant Procedures: none  Significant Labs and Imaging:   Recent Labs Lab 02/04/17 0243 02/05/17 0413 02/07/17 0435  WBC 12.4* 13.3* 14.3*  HGB 12.4* 12.7* 13.2  HCT 37.9* 39.3 40.0  PLT 277 285 327    Recent Labs Lab 01/31/17 1048  02/03/17 0351 02/04/17 0243 02/05/17 0413 02/06/17 0601 02/07/17 0435  NA 133*  < > 131* 132* 129* 128* 130*  K 3.9  < > 3.5 3.3* 3.7 3.4* 3.4*  CL 95*  < > 93* 94* 91* 90* 89*  CO2 26  < > 27 29 28 29 30   GLUCOSE 138*  < > 139* 150* 109*  114* 133*  BUN 27*  < > 21* 17 18 19  21*  CREATININE 1.16  < > 0.82 0.85 0.86 0.90 0.95  CALCIUM 9.0  < > 8.4* 8.6* 8.7* 8.8* 8.9  ALKPHOS 63  --   --   --   --   --   --   AST 21  --   --   --   --   --   --   ALT 22  --   --   --   --   --   --   ALBUMIN 3.6  --   --   --   --   --   --   < > = values in this interval not displayed. BNP 17.8 Trop 0.03 TSH 1.689 PSA 0.65  Dg Chest 2 View Result  Date: 01/31/2017 IMPRESSION: Mild chronic bronchitic-smoking related changes. Cardiomegaly with central pulmonary vascular congestion. No definite pulmonary edema or pneumonia. Electronically Signed   By: David  Martinique M.D.   On: 01/31/2017 13:39   ABI Right ABI of 0.96 and left ABI of 0.98 suggestive of arterial flow within normal limits at rest  VAS [place] LE Summary:  - No evidence of deep vein thrombosis involving the visualized veins of the right lower extremity. - No evidence of deep vein thrombosis involving the visualized veins of the left lower extremity. - . Incidental findings are consistent with: enlarged lymph node on the right and enlarged lymph node on the left. - No evidence of Baker&'s cyst on the right or left.  ECHO Study Conclusions  - Left ventricle: The cavity size was normal. There was mild concentric hypertrophy. Systolic function was normal. The estimated ejection fraction was in the range of 60% to 65%. Wall motion was normal; there were no regional wall motion abnormalities. Doppler parameters are consistent with abnormal left ventricular relaxation (grade 1 diastolic dysfunction). - Aortic valve: Transvalvular velocity was increased, due to stenosis. There was mild stenosis. There was trivial regurgitation. Valve area (VTI): 1.86 cm^2. Valve area (Vmax): 1.77 cm^2. Valve area (Vmean): 1.82 cm^2. - Mitral valve: Calcified annulus. There was mild regurgitation. - Left atrium: The atrium was moderately to severely dilated. - Right atrium: The atrium was mildly dilated. - Pericardium, extracardiac: A trivial pericardial effusion was identified.  Ct Pelvis Wo Contrast Result Date: 02/04/2017  IMPRESSION: 1. No acute abnormality identified within the pelvis. 2. Mildly enlarged bilateral external iliac and inguinal lymph nodes as above, nonspecific, but may be reactive in nature. 3. Nonspecific subcutaneous edema within the lateral  aspect of the visualized proximal thighs. 4. Mild to moderate degenerative osteoarthrosis about the hips, left slightly worse than right. 5. Mild atherosclerosis. Electronically Signed   By: Jeannine Boga M.D.   On: 02/04/2017 20:28   Results/Tests Pending at Time of Discharge: None  Discharge Medications:  Allergies as of 02/07/2017   No Known Allergies     Medication List    STOP taking these medications   furosemide 40 MG tablet Commonly known as:  LASIX   methocarbamol 500 MG tablet Commonly known as:  ROBAXIN     TAKE these medications   ARIPiprazole 10 MG tablet Commonly known as:  ABILIFY Take 5 mg by mouth daily.   cetirizine 10 MG tablet Commonly known as:  ZYRTEC Take 10 mg by mouth daily.   gabapentin 300 MG capsule Commonly known as:  NEURONTIN Take 300-600 mg by mouth 3 (three) times daily. 300 mg twice daily and  600 mg at bedtime   lisinopril-hydrochlorothiazide 10-12.5 MG tablet Commonly known as:  PRINZIDE,ZESTORETIC Take 1 tablet by mouth daily.   loperamide 2 MG capsule Commonly known as:  IMODIUM Take 2 mg by mouth as needed for diarrhea or loose stools.   mupirocin cream 2 % Commonly known as:  BACTROBAN Apply topically 2 (two) times daily.   prazosin 2 MG capsule Commonly known as:  MINIPRESS Take 4 mg by mouth at bedtime.   torsemide 20 MG tablet Commonly known as:  DEMADEX Take 1 tablet (20 mg total) by mouth 2 (two) times daily.   traZODone 100 MG tablet Commonly known as:  DESYREL Take 200 mg by mouth at bedtime.   venlafaxine XR 150 MG 24 hr capsule Commonly known as:  EFFEXOR-XR Take 150 mg by mouth daily with breakfast.       Discharge Instructions: Please refer to Patient Instructions section of EMR for full details.  Patient was counseled important signs and symptoms that should prompt return to medical care, changes in medications, dietary instructions, activity restrictions, and follow up appointments.   Follow-Up  Appointments: Contact information for after-discharge care    Destination    [name] Follow up.   Specialty:  [place] information: [address] [place], MD, MPH PGY-3 [place] [facility] Pager [phone]

## 2017-02-01 NOTE — Evaluation (Signed)
Occupational Therapy Evaluation Patient Details Name: Lawrence [name] MRN: [id number] DOB: 1956/05/13 Today's Date: 02/01/2017    History of Present Illness 61 yo male admitted presenting with swelling up to abdomen and cellulitis . PMH is significant for COPD, HTN, morbid obesity, OA (R knee, s/p replacement of L knee), OSA, PTSD, Depression, anxiety.    Clinical Impression   PT admitted with swelling in abdomen and cellulitis. Pt currently with functional limitiations due to the deficits listed below (see OT problem list). PTA to was independent with adls.  Pt will benefit from skilled OT to increase their independence and safety with adls and balance to allow discharge hhot at this time but unable to progress for tub transfer/ stair navigation may require SNF. Pt and wife expressed understanding these concerns. .     Follow Up Recommendations  Home health OT    Equipment Recommendations  Other (comment) (bari RW)    Recommendations for Other Services       Precautions / Restrictions Precautions Precautions: Fall Precaution Comments: oxygen sats watch HR      Mobility Bed Mobility Overal bed mobility: Needs Assistance Bed Mobility: Supine to Sit;Rolling Rolling: Supervision   Supine to sit: Mod assist     General bed mobility comments: pt reaching and pulling on OT to exit bed. Pt needs incr time and rebound at EOB   Transfers Overall transfer level: Needs assistance Equipment used: Rolling walker (2 wheeled) Transfers: Sit to/from Stand Sit to Stand: Min assist;From elevated surface         General transfer comment: surface elevated and able to compelte sit<>Stand x3 during session.     Balance Overall balance assessment: Needs assistance Sitting-balance support: Bilateral upper extremity supported;Feet supported Sitting balance-Leahy Scale: Fair     Standing balance support: Bilateral upper extremity supported;During functional activity Standing  balance-Leahy Scale: Poor Standing balance comment: heavy reliance on RW                           ADL either performed or assessed with clinical judgement   ADL Overall ADL's : Needs assistance/impaired Eating/Feeding: Independent   Grooming: Set up   Upper Body Bathing: Set up   Lower Body Bathing: Moderate assistance       Lower Body Dressing: Maximal assistance Lower Body Dressing Details (indicate cue type and reason): pt with wet LB garments total (A) to doff unable to pull them down, unable to thread them on or off. Pt able to static stand and wipe peri area one hand (A) on RW but unable to sustain               General ADL Comments: pt has wife present and able to (A) upon d/c but wife has back issues. pt must be at min guard ()A level for d/c . Biggest concern for OT is Tub and stair transfers     Vision Baseline Vision/History: No visual deficits       Perception     Praxis      Pertinent Vitals/Pain Pain Assessment: No/denies pain     Hand Dominance Right   Extremity/Trunk Assessment Upper Extremity Assessment Upper Extremity Assessment: Overall WFL for tasks assessed   Lower Extremity Assessment Lower Extremity Assessment: Defer to PT evaluation;RLE deficits/detail;LLE deficits/detail RLE Deficits / Details: edema LLE Deficits / Details: edema   Cervical / Trunk Assessment Cervical / Trunk Assessment: Normal   Communication Communication Communication: No difficulties   Cognition Arousal/Alertness:  Awake/alert Behavior During Therapy: WFL for tasks assessed/performed Overall Cognitive Status: Within Functional Limits for tasks assessed                                     General Comments       Exercises     Shoulder Instructions      Home Living Family/patient expects to be discharged to:: Private residence Living Arrangements: Spouse/significant other Available Help at Discharge: Family Type of Home:  Apartment Home Access: Stairs to enter Technical brewer of Steps: 10 Entrance Stairs-Rails: Right Home Layout: One level     Bathroom Shower/Tub: Teacher, early years/pre: Standard     Home Equipment: Cane - single point;Crutches   Additional Comments: pt with recent ankle injury at the beginning of MAy. pt uses crutches in the evening due to fatigue. pt reports heavy reliance on crutches       Prior Functioning/Environment Level of Independence: Independent                 OT Problem List: Decreased strength;Decreased activity tolerance;Impaired balance (sitting and/or standing);Decreased safety awareness;Decreased knowledge of use of DME or AE;Decreased knowledge of precautions;Cardiopulmonary status limiting activity;Obesity      OT Treatment/Interventions: Self-care/ADL training;Therapeutic exercise;Energy conservation;DME and/or AE instruction;Therapeutic activities;Patient/family education;Balance training    OT Goals(Current goals can be found in the care plan section) Acute Rehab OT Goals Patient Stated Goal: pt wants to be able to move apartments to avoid stairs OT Goal Formulation: With patient/family Time For Goal Achievement: 02/15/17 Potential to Achieve Goals: Good  OT Frequency: Min 2X/week   Barriers to D/C:            Co-evaluation              AM-PAC PT "6 Clicks" Daily Activity     Outcome Measure Help from another person eating meals?: None Help from another person taking care of personal grooming?: A Little Help from another person toileting, which includes using toliet, bedpan, or urinal?: A Little Help from another person bathing (including washing, rinsing, drying)?: A Lot Help from another person to put on and taking off regular upper body clothing?: A Lot Help from another person to put on and taking off regular lower body clothing?: A Lot 6 Click Score: 16   End of Session Equipment Utilized During Treatment: Gait  belt;Rolling walker Nurse Communication: Mobility status;Precautions  Activity Tolerance: Patient tolerated treatment well Patient left: Other (comment) (with PT Rod Holler)  OT Visit Diagnosis: Unsteadiness on feet (R26.81)                Time: 0488-8916 OT Time Calculation (min): 20 min Charges:  OT General Charges $OT Visit: 1 Procedure OT Evaluation $OT Eval Moderate Complexity: 1 Procedure G-Codes:      Jeri Modena   OTR/L Pager: [phone] Office: [phone] .   Parke Poisson B 02/01/2017, 3:27 PM

## 2017-02-01 NOTE — Progress Notes (Signed)
Pt. Troponin 0.03. Family medicine teaching service paged to make aware.

## 2017-02-01 NOTE — Evaluation (Signed)
Physical Therapy Evaluation Patient Details Name: Job Holtsclaw MRN: [id number] DOB: 09-03-1955 Today's Date: 02/01/2017   History of Present Illness  61 yo male admitted presenting with swelling up to abdomen and cellulitis . PMH is significant for COPD, HTN, morbid obesity, OA (R knee, s/p replacement of L knee), OSA, PTSD, Depression, anxiety.   Clinical Impression  Pt is up to walk with PT and note he is surprisingly capable x decline in O2 sats with room air then further declined once sitting.  Put pt in chair and replaced cannula as he desatted to 89% on rest.  However, he was up to 93% with gait initially on room air.  Will follow up with him to strengthen and prep for SNF to shorten stay as much as possible.    Follow Up Recommendations SNF    Equipment Recommendations  Other (comment) (rollator walker)    Recommendations for Other Services       Precautions / Restrictions Precautions Precautions: Fall Precaution Comments: oxygen sats watch HR Restrictions Weight Bearing Restrictions: No      Mobility  Bed Mobility Overal bed mobility: Needs Assistance Bed Mobility: Supine to Sit Rolling: Supervision   Supine to sit: Mod assist     General bed mobility comments: requires assistance with PT to get up from side of bed  Transfers Overall transfer level: Needs assistance Equipment used: 4-wheeled walker Transfers: Sit to/from Stand Sit to Stand: Min assist;From elevated surface         General transfer comment: sit to stand from bed, rollator and chair  Ambulation/Gait Ambulation/Gait assistance: Min guard Ambulation Distance (Feet): 25 Feet Assistive device: 4-wheeled walker Gait Pattern/deviations: Step-through pattern;Decreased stride length;Wide base of support;Shuffle Gait velocity: reduced Gait velocity interpretation: Below normal speed for age/gender General Gait Details: pt is unsteady on his feet but able to control with RW, prefers rollator to  previous [facility]  Stairs            Wheelchair Mobility    Modified Rankin (Stroke Patients Only)       Balance Overall balance assessment: Needs assistance Sitting-balance support: Bilateral upper extremity supported;Feet supported Sitting balance-Leahy Scale: Fair     Standing balance support: Bilateral upper extremity supported Standing balance-Leahy Scale: Poor Standing balance comment: heavy reliance on RW                             Pertinent Vitals/Pain Pain Assessment: No/denies pain    Home Living Family/patient expects to be discharged to:: Private residence Living Arrangements: Spouse/significant other Available Help at Discharge: Family Type of Home: Apartment Home Access: Stairs to enter Entrance Stairs-Rails: Right Entrance Stairs-Number of Steps: 10 Home Layout: One level Home Equipment: Cane - single point;Crutches Additional Comments: pt with recent ankle injury at the beginning of May. pt uses crutches in the evening due to fatigue. pt reports heavy reliance on crutches     Prior Function Level of Independence: Independent               Hand Dominance   Dominant Hand: Right    Extremity/Trunk Assessment   Upper Extremity Assessment Upper Extremity Assessment: Overall WFL for tasks assessed    Lower Extremity Assessment Lower Extremity Assessment: Overall WFL for tasks assessed RLE Deficits / Details: edema LLE Deficits / Details: edema    Cervical / Trunk Assessment Cervical / Trunk Assessment: Normal  Communication   Communication: No difficulties  Cognition Arousal/Alertness: Awake/alert Behavior During  Therapy: WFL for tasks assessed/performed Overall Cognitive Status: Within Functional Limits for tasks assessed                                        General Comments General comments (skin integrity, edema, etc.): Pt was able to remove O2 and walk with O2 sats 93% but then pulse was 118 with  room air.      Exercises     Assessment/Plan    PT Assessment Patient needs continued PT services  PT Problem List Decreased strength;Decreased range of motion;Decreased activity tolerance;Decreased balance;Decreased mobility;Decreased coordination;Decreased knowledge of use of DME;Decreased safety awareness;Decreased knowledge of precautions;Cardiopulmonary status limiting activity;Decreased skin integrity;Obesity       PT Treatment Interventions DME instruction;Gait training;Stair training;Functional mobility training;Therapeutic activities;Therapeutic exercise;Balance training;Neuromuscular re-education;Patient/family education    PT Goals (Current goals can be found in the Care Plan section)  Acute Rehab PT Goals Patient Stated Goal: pt wants to be able to move apartments to avoid stairs PT Goal Formulation: With patient Time For Goal Achievement: 02/15/17 Potential to Achieve Goals: Good    Frequency Min 3X/week   Barriers to discharge Decreased caregiver support;Inaccessible home environment (has been home alone while family works and stairs to house)      Co-evaluation               AM-PAC PT "6 Clicks" Daily Activity  Outcome Measure Difficulty turning over in bed (including adjusting bedclothes, sheets and blankets)?: Total Difficulty moving from lying on back to sitting on the side of the bed? : Total Difficulty sitting down on and standing up from a chair with arms (e.g., wheelchair, bedside commode, etc,.)?: Total Help needed moving to and from a bed to chair (including a wheelchair)?: A Lot Help needed walking in hospital room?: A Little Help needed climbing 3-5 steps with a railing? : Total 6 Click Score: 9    End of Session Equipment Utilized During Treatment: Oxygen Activity Tolerance: Patient tolerated treatment well;Patient limited by fatigue Patient left: in chair;with call bell/phone within reach;with family/visitor present Nurse Communication:  Mobility status PT Visit Diagnosis: Unsteadiness on feet (R26.81);Muscle weakness (generalized) (M62.81);Difficulty in walking, not elsewhere classified (R26.2)    Time: 9741-6384 PT Time Calculation (min) (ACUTE ONLY): 34 min   Charges:   PT Evaluation $PT Eval Moderate Complexity: 1 Procedure PT Treatments $Gait Training: 8-22 mins   PT G Codes:   PT G-Codes **NOT FOR INPATIENT CLASS** Functional Assessment Tool Used: AM-PAC 6 Clicks Basic Mobility    Ramond Dial 02/01/2017, 6:11 PM   Mee Hives, PT MS Acute Rehab Dept. Number: [place] and [place]

## 2017-02-01 NOTE — Progress Notes (Signed)
Family Medicine Teaching Service Daily Progress Note Intern Pager: [phone]  Patient name: Lawrence [name] Medical record number: [id number] Date of birth: 06-Jun-1956 Age: 61 y.o. Gender: male  Primary Care Provider: [place] Consultants: none Code Status: DNR  Pt Overview and Major Events to Date:  6/26- admitted to FPTS   Assessment and Plan: Cord Wilczynski is a 61 y.o. male presenting with swelling up to abdomen . PMH is significant for COPD, HTN, morbid obesity, OA (R knee, s/p replacement of L knee), OSA, PTSD, Depression, anxiety.   LE edema- Patient thinks his dry weight is around 374, weight here 427 pounds on admission, 427 this morning. UOP 1.37L  -monitor on telemetry -vitals per floor -up with assistance -obtain Echo -continue IV diuretics with 40mg  IV lasix BID -strict I&O -daily weights  -PT/OT consults -obtain ABI's -check LE dopplers to r/o DVT -elevate scrotum q shift  Purulent LE Cellulitis- secondary to edema. No documented fevers but endorses chills. WBC elevated to 12.7 > 11.4. Concern for bacterial infection with possible fungal component.  -consider c/s to wound care  -continue IV vanc, patient has developed a puritic rash under arms bilaterally. Will give PO benadryl, advised patient to notify us if this worsens -topical nystatin powder to legs bilaterally BID  Episode of CP- occurred overnight when RT brought in CPAP, consistent with anxiety. EKG without changes. Trop 0.03 -repeat trop -treat anxiety as below -monitor closely  COPD- recent diagnosis, 45 pack year smoking history. No current medications. Reports albuterol in past has not helped breathing. Not in current exacerbation. -albuterol q4 as needed  -consider starting controller medication this hospitalization  HTN- BP 123/61 this morning -continue HCTZ and ARB (d/ced ACEi due to cough) -monitor BP  Allergies- on zyrtec 10 mg qhs -continue antihistamine  Chronic pain-  on gabapentin 600 mg every night and 300 mg every morning, reports this is for leg pain -continue gabapentin  Nightmares- on Prazosin 2 mg two capsules every night -continue home medication  OSA- supposed to be on CPAP but mask ill-fitting at home and may be contributing to nightmares. Could not tolerate night of admission due to anxiety. -CPAP qhs  Chronic diarrhea- takes immodium as needed, more like twice a day.  -continue immodium  PTSD/Depression/Anxiety- on Aripiprazole 10 mg every night, Trazodone 100 mg every night, Venlafaxine 150 mg in am -continue home medication  FEN/GI: heart healthy diet Prophylaxis: lovenox  Disposition: pending clinical improvement  Subjective:  Doing well, still SOB, no CP. Legs painful. He had anxiety with CPAP last night as there was so much going on but would like to try to use it tonight again.   Objective: Temp:  [97.8 F (36.6 C)-98.1 F (36.7 C)] 98.1 F (36.7 C) (06/27 0619) Pulse Rate:  [91-112] 106 (06/27 0619) Resp:  [15-24] 19 (06/27 0619) BP: (110-143)/(54-81) 123/61 (06/27 0619) SpO2:  [92 %-98 %] 94 % (06/27 0619) Weight:  [427 lb 4.8 oz (193.8 kg)-427 lb 9.6 oz (194 kg)] 427 lb 9.6 oz (194 kg) (06/27 1025) Physical Exam: General: morbidly obese man laying in bed in NAD, [place] in place Cardiovascular: RRR. 3/6 systolic murmur best heard at left sternal border Respiratory: difficult to auscultate due to body habitus. No increased WOB Abdomen: obese. Non tender. Distended. Erythema within previously marked lines. Extremities: purulent drainage to anterior tibia bilaterally, +2 pitting edema up to thighs  Laboratory:  Recent Labs Lab 01/31/17 1048 02/01/17 0602  WBC 12.7* 11.4*  HGB 13.0 11.9*  HCT  40.4 37.5*  PLT 260 255    Recent Labs Lab 01/31/17 1048 02/01/17 0602  NA 133* 134*  K 3.9 4.0  CL 95* 98*  CO2 26 28  BUN 27* 20  CREATININE 1.16 0.86  CALCIUM 9.0 8.3*  PROT 6.8  --   BILITOT 0.5  --    ALKPHOS 63  --   ALT 22  --   AST 21  --   GLUCOSE 138* 119*   BNP 17.8 Trop 0.03 TSH 1.689  Imaging/Diagnostic Tests: Dg Chest 2 View  Result Date: 01/31/2017 CLINICAL DATA:  Four weeks of shortness of breath which has worsened today. Suspect CHF. EXAM: CHEST  2 VIEW COMPARISON:  None in PACs FINDINGS: The lungs are well-expanded. There is no focal infiltrate. The cardiac silhouette is enlarged. The pulmonary vascularity while mildly prominent centrally do is not clearly cephalized. The mediastinum is normal in width. IMPRESSION: Mild chronic bronchitic-smoking related changes. Cardiomegaly with central pulmonary vascular congestion. No definite pulmonary edema or pneumonia. Electronically Signed   By: David  Martinique M.D.   On: 01/31/2017 13:39     Steve Rattler, DO 02/01/2017, 8:19 AM PGY-1, [place] Intern pager: [phone], text pages welcome

## 2017-02-01 NOTE — Progress Notes (Signed)
VASCULAR LAB PRELIMINARY  ARTERIAL  ABI completed: Right ABI of 0.96 and left ABI of 0.98 are suggestive of arterial flow within normal limits at rest.   RIGHT    LEFT    PRESSURE WAVEFORM  PRESSURE WAVEFORM  BRACHIAL 120 Triphasic BRACHIAL 141 Triphasic  DP 135 Biphasic DP 134 Biphasic  PT 112 Biphasic PT 138 Biphasic    RIGHT LEFT  ABI 0.96 0.98     Legrand Como, RVT 02/01/2017, 9:49 AM

## 2017-02-02 ENCOUNTER — Inpatient Hospital Stay (HOSPITAL_COMMUNITY): Payer: Non-veteran care

## 2017-02-02 ENCOUNTER — Other Ambulatory Visit (HOSPITAL_COMMUNITY): Payer: BLUE CROSS/BLUE SHIELD

## 2017-02-02 DIAGNOSIS — M79609 Pain in unspecified limb: Secondary | ICD-10-CM

## 2017-02-02 DIAGNOSIS — M7989 Other specified soft tissue disorders: Secondary | ICD-10-CM

## 2017-02-02 LAB — BASIC METABOLIC PANEL
Anion gap: 8 (ref 5–15)
BUN: 21 mg/dL — ABNORMAL HIGH (ref 6–20)
CO2: 26 mmol/L (ref 22–32)
Calcium: 8.2 mg/dL — ABNORMAL LOW (ref 8.9–10.3)
Chloride: 97 mmol/L — ABNORMAL LOW (ref 101–111)
Creatinine, Ser: 0.83 mg/dL (ref 0.61–1.24)
GFR calc Af Amer: >60 mL/min (ref >60)
GFR calc non Af Amer: >60 mL/min (ref >60)
Glucose, Bld: 125 mg/dL — ABNORMAL HIGH (ref 65–99)
Potassium: 4 mmol/L (ref 3.5–5.1)
Sodium: 131 mmol/L — ABNORMAL LOW (ref 135–145)

## 2017-02-02 LAB — ECHOCARDIOGRAM COMPLETE
AO mean calculated velocity dopler: 186 cm/s
AV Area VTI index: 0.63 cm2/m2
AV Area VTI: 1.77 cm2
AV Area mean vel: 1.82 cm2
AV Mean grad: 16 mmHg
AV Peak grad: 30 mmHg
AV VEL mean LVOT/AV: 0.53
AV area mean vel ind: 0.62 cm2/m2
AV peak Index: 0.6
AV pk vel: 276 cm/s
AV vel: 1.86
Ao pk vel: 0.51 m/s
E decel time: 227 msec
E/e' ratio: 11.63
FS: 32 % (ref 28–44)
Height: 73 in
IVS/LV PW RATIO, ED: 0.79
LA ID, A-P, ES: 47 mm
LA diam end sys: 47 mm
LA diam index: 1.59 cm/m2
LA vol A4C: 72.4 ml
LA vol index: 28.7 mL/m2
LA vol: 85 mL
LV E/e' medial: 11.63
LV E/e'average: 11.63
LV PW d: 13.7 mm — AB (ref 0.6–1.1)
LV e' LATERAL: 9.46 cm/s
LVOT SV: 96 mL
LVOT VTI: 27.7 cm
LVOT area: 3.46 cm2
LVOT diameter: 21 mm
LVOT peak VTI: 0.54 cm
LVOT peak grad rest: 8 mmHg
LVOT peak vel: 141 cm/s
Lateral S' vel: 19.8 cm/s
MV Dec: 227
MV Peak grad: 5 mmHg
MV pk A vel: 89.4 m/s
MV pk E vel: 110 m/s
TAPSE: 29.9 mm
TDI e' lateral: 9.46
TDI e' medial: 12
VTI: 51.5 cm
Valve area index: 0.63
Valve area: 1.86 cm2
Weight: 6795.2 oz

## 2017-02-02 LAB — TROPONIN I: Troponin I: 0.06 ng/mL (ref <0.03)

## 2017-02-02 LAB — CBC
HCT: 37.3 % — ABNORMAL LOW (ref 39.0–52.0)
Hemoglobin: 12 g/dL — ABNORMAL LOW (ref 13.0–17.0)
MCH: 28.7 pg (ref 26.0–34.0)
MCHC: 32.2 g/dL (ref 30.0–36.0)
MCV: 89.2 fL (ref 78.0–100.0)
Platelets: 255 10*3/uL (ref 150–400)
RBC: 4.18 MIL/uL — ABNORMAL LOW (ref 4.22–5.81)
RDW: 14.9 % (ref 11.5–15.5)
WBC: 12.1 10*3/uL — ABNORMAL HIGH (ref 4.0–10.5)

## 2017-02-02 LAB — GLUCOSE, CAPILLARY: Glucose-Capillary: 115 mg/dL — ABNORMAL HIGH (ref 65–99)

## 2017-02-02 MED ORDER — FUROSEMIDE 10 MG/ML IJ SOLN
80.0000 mg | Freq: Two times a day (BID) | INTRAMUSCULAR | Status: DC
  Administered 2017-02-02 – 2017-02-04 (×4): 80 mg via INTRAVENOUS
  Filled 2017-02-02 (×4): qty 8

## 2017-02-02 MED ORDER — MUPIROCIN CALCIUM 2 % EX CREA
TOPICAL_CREAM | Freq: Two times a day (BID) | CUTANEOUS | Status: DC
  Administered 2017-02-02 – 2017-02-07 (×10): via TOPICAL
  Filled 2017-02-02 (×4): qty 15

## 2017-02-02 NOTE — Progress Notes (Signed)
Occupational Therapy Treatment Patient Details Name: Lawrence [name] MRN: [id number] DOB: Jan 10, 1956 Today's Date: 02/02/2017    History of present illness 61 yo male admitted presenting with swelling up to abdomen and cellulitis . PMH is significant for COPD, HTN, morbid obesity, OA (R knee, s/p replacement of L knee), OSA, PTSD, Depression, anxiety.    OT comments  Pt tolerated increased mobility today requiring min guard assist; SpO2 92% on RA, DOE 2/4. Pt able to complete grooming task x2 standing at the sink with min guard assist with improved activity and standing tolerance. Educated pt on pursed lip breathing and energy conservation strategies. Updated d/c plan to SNF for follow up to maximize independence and safety with ADL and functional mobility prior to return home. Will continue to follow acutely.   Follow Up Recommendations  SNF;Supervision/Assistance - 24 hour    Equipment Recommendations  Other (comment) (bari RW)    Recommendations for Other Services      Precautions / Restrictions Precautions Precautions: Fall Precaution Comments: oxygen sats watch HR Restrictions Weight Bearing Restrictions: No       Mobility Bed Mobility               General bed mobility comments: Pt OOB in chair upon arrival.  Transfers Overall transfer level: Needs assistance Equipment used: Rolling walker (2 wheeled) Transfers: Sit to/from Stand Sit to Stand: Min guard         General transfer comment: min guard for safety with sit to stand from chair. good hand placement    Balance Overall balance assessment: Needs assistance Sitting-balance support: Feet supported;No upper extremity supported Sitting balance-Leahy Scale: Good     Standing balance support: Single extremity supported;During functional activity Standing balance-Leahy Scale: Fair                             ADL either performed or assessed with clinical judgement   ADL Overall ADL's :  Needs assistance/impaired     Grooming: Min guard;Standing;Wash/dry face;Wash/dry hands               Lower Body Dressing: Maximal assistance Lower Body Dressing Details (indicate cue type and reason): to don socks Toilet Transfer: Min guard;Ambulation;RW Toilet Transfer Details (indicate cue type and reason): simulated by sit to stand from chair with functional mobility in room         Functional mobility during ADLs: Min guard;Rolling walker General ADL Comments: SpO2 92% on RA following activity; DOE 2/4. Educated pt on pursed lip breathing, energy conservation strategies, and encouraged continued mobility with Conservation officer, historic buildings.     Vision       Perception     Praxis      Cognition Arousal/Alertness: Awake/alert Behavior During Therapy: WFL for tasks assessed/performed Overall Cognitive Status: Within Functional Limits for tasks assessed                                          Exercises     Shoulder Instructions       General Comments      Pertinent Vitals/ Pain       Pain Assessment: Faces Faces Pain Scale: Hurts a little bit Pain Location: generalized Pain Descriptors / Indicators: Sore Pain Intervention(s): Monitored during session  Home Living  Prior Functioning/Environment              Frequency  Min 2X/week        Progress Toward Goals  OT Goals(current goals can now be found in the care plan section)  Progress towards OT goals: Progressing toward goals  Acute Rehab OT Goals Patient Stated Goal: pt wants to be able to move apartments to avoid stairs OT Goal Formulation: With patient/family  Plan Discharge plan needs to be updated    Co-evaluation                 AM-PAC PT "6 Clicks" Daily Activity     Outcome Measure   Help from another person eating meals?: None Help from another person taking care of personal grooming?: A Little Help from another  person toileting, which includes using toliet, bedpan, or urinal?: A Little Help from another person bathing (including washing, rinsing, drying)?: A Lot Help from another person to put on and taking off regular upper body clothing?: A Little Help from another person to put on and taking off regular lower body clothing?: A Lot 6 Click Score: 17    End of Session Equipment Utilized During Treatment: Rolling walker  OT Visit Diagnosis: Unsteadiness on feet (R26.81)   Activity Tolerance Patient tolerated treatment well   Patient Left in chair;with call bell/phone within reach;with family/visitor present   Nurse Communication          Time: 1834-3735 OT Time Calculation (min): 17 min  Charges: OT General Charges $OT Visit: 1 Procedure OT Treatments $Self Care/Home Management : 8-22 mins  Titianna Loomis A. Ulice Brilliant, M.S., OTR/L Pager: [place] 02/02/2017, 3:28 PM

## 2017-02-02 NOTE — Progress Notes (Signed)
FPTS Interim Progress Note  Went to check on patient to see if there is was improvement in cellulitis and edema. He was sleeping comfortably in bed with CPAP machine on. Did not wake patient. Will continue to monitor overnight.   Carlyle Dolly, MD 02/02/2017, 10:09 PM PGY-2, [place] Medicine Service pager [phone]

## 2017-02-02 NOTE — Progress Notes (Signed)
  Echocardiogram 2D Echocardiogram has been performed.  Johny Chess 02/02/2017, 5:28 PM

## 2017-02-02 NOTE — Progress Notes (Signed)
Family Medicine Teaching Service Daily Progress Note Intern Pager: [phone]  Patient name: Lawrence [name] Medical record number: [id number] Date of birth: 1956/07/25 Age: 61 y.o. Gender: male  Primary Care Provider: [place] Consultants: none Code Status: DNR  Pt Overview and Major Events to Date:  6/26- admitted to FPTS   Assessment and Plan: Lawrence [name] is a 61 y.o. male presenting with swelling up to abdomen . PMH is significant for COPD, HTN, morbid obesity, OA (R knee, s/p replacement of L knee), OSA, PTSD, Depression, anxiety.   #LE edema, moderate/severe, improving  Patient thinks his dry weight is around 374, on admission weight was 427 lb. This am UOP is -1226 ml and weight is 424 lb. Right ABI of 0.96 and left ABI of 0.98 are suggestive of arterial flow within normal limits at rest.  --Follow up on Echo --Increase lasix to 80 mg IV bid --strict I&O --Daily weights  --PT/OT  --Follow up on LE dopplers to r/o DVT --Elevate scrotum q shift  #Purulent LE Cellulitis 2/2 significant LE edema Stable vitals signs overnight. WBC 11.4>12.1. Concern for bacterial infection with possible fungal component. Would consider broadening coverage given rise in WBCs.  --Follow up on [place] consult --Continue Vancomycin 1250 mg IV bid --Continue Nystatin powder to legs bilaterally BID  #Episodic Chest Pain Likely secondary to anxiety. Trop 0.03>0.03>0.03>0.0.3>0.06. --Treat anxiety as below --Monitor closely  #COPD recent diagnosis, 45 pack year smoking history. No current medications. Reports albuterol in past has not helped breathing. Not in current exacerbation. --Albuterol q4 as needed  --Consider starting controller medication this hospitalization  #HTN BP 137/61 this morning. --Continue HCTZ 12.5 mg daily --Continue Losartan  25 mg daily   #Chronic pain Secondary to leg pain, will continue home regimen. --Continue gabapentin  gabapentin 600 mg am, and 300 mg  pm  #Allergies On zyrtec 10 mg qhs --Continue Loratadine 10 mg  qhs  #Nightmares --Continue home medication Prazosin 4 mg qhs  #OSA supposed to be on CPAP but mask ill-fitting at home and may be contributing to nightmares. Could not tolerate night of admission due to anxiety. --CPAP qhs  #Chronic diarrhea --Continue loperamide 2 mg bid  #PTSD/Depression/Anxiety --Continue Aripiprazole 10 mg qhs --Continue Trazodone 100 mg qhs --Continue Venlafaxine 150 mg in am   FEN/GI: heart healthy diet Prophylaxis: lovenox  Disposition: pending clinical improvement  Subjective:  Patient is doing better this morning, used the Bipap overnight. Patient still has significant lower extremities edema with weeping wounds, but denies fever or chills overnight.  Objective: Temp:  [97.8 F (36.6 C)-99.4 F (37.4 C)] 98.7 F (37.1 C) (06/28 0617) Pulse Rate:  [90-103] 90 (06/28 0617) Resp:  [17-18] 17 (06/28 0617) BP: (108-142)/(42-73) 137/61 (06/28 0617) SpO2:  [93 %-96 %] 95 % (06/28 0617) Weight:  [424 lb 11.2 oz (192.6 kg)] 424 lb 11.2 oz (192.6 kg) (06/28 0617)  Physical Exam: General: morbidly obese man sitting in the chair in NAD,  Cardiovascular: RRR. 3/6 systolic murmur best heard at left sternal border Respiratory: difficult to auscultate due to body habitus. No increased WOB Abdomen: obese. Non tender. Distended. Erythema within previously marked lines. Extremities: purulent drainage to anterior tibia bilaterally, significant edema bilaterally with mild hyperpigmentation consistent with venous stasis.  Laboratory:  Recent Labs Lab 01/31/17 1048 02/01/17 0602 02/02/17 0014  WBC 12.7* 11.4* 12.1*  HGB 13.0 11.9* 12.0*  HCT 40.4 37.5* 37.3*  PLT 260 255 255    Recent Labs Lab 01/31/17 1048 02/01/17 0602 02/02/17  0014  NA 133* 134* 131*  K 3.9 4.0 4.0  CL 95* 98* 97*  CO2 26 28 26   BUN 27* 20 21*  CREATININE 1.16 0.86 0.83  CALCIUM 9.0 8.3* 8.2*  PROT 6.8   --   --   BILITOT 0.5  --   --   ALKPHOS 63  --   --   ALT 22  --   --   AST 21  --   --   GLUCOSE 138* 119* 125*   BNP 17.8 Trop 0.03 TSH 1.689  Imaging/Diagnostic Tests: Dg Chest 2 View  Result Date: 01/31/2017 CLINICAL DATA:  Four weeks of shortness of breath which has worsened today. Suspect CHF. EXAM: CHEST  2 VIEW COMPARISON:  None in PACs FINDINGS: The lungs are well-expanded. There is no focal infiltrate. The cardiac silhouette is enlarged. The pulmonary vascularity while mildly prominent centrally do is not clearly cephalized. The mediastinum is normal in width. IMPRESSION: Mild chronic bronchitic-smoking related changes. Cardiomegaly with central pulmonary vascular congestion. No definite pulmonary edema or pneumonia. Electronically Signed   By: David  Martinique M.D.   On: 01/31/2017 13:39    Marjie Skiff, MD 02/02/2017, 6:27 AM PGY-1, [place] Intern pager: [phone], text pages welcome

## 2017-02-02 NOTE — Progress Notes (Signed)
*  PRELIMINARY RESULTS* Vascular Ultrasound Bilateral lower extremity venous duplex has been completed.  Preliminary findings: No evidence of deep vein thrombosis in the visualized veins of the lower extremities.  Difficult exam due to patient body habitus and limitations of penetration.  Negative for baker's cysts bilaterally.   Lawrence [name] 02/02/2017, 12:25 PM

## 2017-02-02 NOTE — NC FL2 (Signed)
[place] LEVEL OF CARE SCREENING TOOL     IDENTIFICATION  Patient Name: Lawrence [name] Birthdate: 10-04-1955 Sex: male Admission Date (Current Location): 01/31/2017  [facility] and [place] Number:  Herbalist and Address:  [facility] [place], [place] [address] [place], [place] [address]      Provider Number: [id number]  Attending Physician Name and Address:  Alveda Reasons, MD  Relative Name and Phone Number:       Current Level of Care: Hospital Recommended Level of Care: [place] Prior Approval Number:    Date Approved/Denied:   PASRR Number: Manual review  Discharge Plan: SNF    Current Diagnoses: Patient Active Problem List   Diagnosis Date Noted  . Morbid obesity ([place])   . PTSD (post-traumatic stress disorder)   . Anasarca 01/31/2017  . Leg edema 01/31/2017  . Cellulitis of lower extremity   . OSA (obstructive sleep apnea)     Orientation RESPIRATION BLADDER Height & Weight     Self, Time, Place, Situation  Normal Continent Weight: (!) 424 lb 11.2 oz (192.6 kg) (Scale B) Height:  6\' 1"  (185.4 cm)  BEHAVIORAL SYMPTOMS/MOOD NEUROLOGICAL BOWEL NUTRITION STATUS   (None)  (None) Continent Diet (Heart healthy)  AMBULATORY STATUS COMMUNICATION OF NEEDS Skin   Limited Assist Verbally Other (Comment) (Blister, Cellulitis. Weeping on both legs.)                       Personal Care Assistance Level of Assistance  Bathing, Feeding, Dressing Bathing Assistance: Limited assistance Feeding assistance: Independent Dressing Assistance: Maximum assistance     Functional Limitations Info  Sight, Hearing, Speech Sight Info: Adequate Hearing Info: Adequate Speech Info: Adequate    SPECIAL CARE FACTORS FREQUENCY  PT (By licensed PT), OT (By licensed OT)     PT Frequency: 5 x week OT Frequency: 3 x week            Contractures Contractures Info: Not present    Additional Factors Info  Code  Status, Allergies, Psychotropic Code Status Info: DNR Allergies Info: NKDA Psychotropic Info: PTSD: Abilify 10 mg PO QHS, Trazodone 100 mg PO QHS, Effexor XR 150 mg PO daily with breakfast.         Current Medications (02/02/2017):  This is the current hospital active medication list Current Facility-Administered Medications  Medication Dose Route Frequency Provider Last Rate Last Dose  . 0.9 %  sodium chloride infusion  250 mL Intravenous PRN Lucila Maine C, DO      . acetaminophen (TYLENOL) tablet 650 mg  650 mg Oral Q6H PRN Lucila Maine C, DO       Or  . acetaminophen (TYLENOL) suppository 650 mg  650 mg Rectal Q6H PRN Riccio, Angela C, DO      . albuterol (PROVENTIL) (2.5 MG/3ML) 0.083% nebulizer solution 3 mL  3 mL Inhalation Q4H PRN Riccio, Angela C, DO      . ARIPiprazole (ABILIFY) tablet 10 mg  10 mg Oral QHS Riccio, Angela C, DO   10 mg at 02/01/17 2130  . diphenhydrAMINE (BENADRYL) capsule 25 mg  25 mg Oral Q6H PRN Lucila Maine C, DO   25 mg at 02/02/17 1129  . enoxaparin (LOVENOX) injection 90 mg  90 mg Subcutaneous Q24H Leodis Sias, RPH   90 mg at 02/01/17 2129  . furosemide (LASIX) injection 80 mg  80 mg Intravenous BID Bufford Lope, DO      .  gabapentin (NEURONTIN) capsule 300 mg  300 mg Oral q morning - 10a Riccio, Angela C, DO   300 mg at 02/02/17 2035  . gabapentin (NEURONTIN) capsule 600 mg  600 mg Oral QHS Riccio, Angela C, DO   600 mg at 02/01/17 2129  . hydrochlorothiazide (MICROZIDE) capsule 12.5 mg  12.5 mg Oral Daily Lucila Maine C, DO   12.5 mg at 02/02/17 0936  . loperamide (IMODIUM) capsule 2 mg  2 mg Oral BID PRN Lucila Maine C, DO   2 mg at 01/31/17 2136  . loratadine (CLARITIN) tablet 10 mg  10 mg Oral QHS Riccio, Angela C, DO   10 mg at 02/01/17 2130  . losartan (COZAAR) tablet 25 mg  25 mg Oral Daily Lucila Maine C, DO   25 mg at 02/02/17 5974  . mupirocin cream (BACTROBAN) 2 %   Topical BID Diallo, Abdoulaye, MD      . nystatin (MYCOSTATIN/NYSTOP)  topical powder   Topical BID Riccio, Angela C, DO      . prazosin (MINIPRESS) capsule 4 mg  4 mg Oral QHS Riccio, Angela C, DO   4 mg at 02/02/17 0102  . sodium chloride flush (NS) 0.9 % injection 3 mL  3 mL Intravenous Q12H Riccio, Angela C, DO      . sodium chloride flush (NS) 0.9 % injection 3 mL  3 mL Intravenous Q12H Riccio, Angela C, DO   3 mL at 02/01/17 0932  . sodium chloride flush (NS) 0.9 % injection 3 mL  3 mL Intravenous PRN Lucila Maine C, DO      . traZODone (DESYREL) tablet 100 mg  100 mg Oral QHS Riccio, Angela C, DO   100 mg at 02/01/17 2130  . venlafaxine XR (EFFEXOR-XR) 24 hr capsule 150 mg  150 mg Oral Q breakfast Lucila Maine C, DO   150 mg at 02/02/17 0802     Discharge Medications: Please see discharge summary for a list of discharge medications.  Relevant Imaging Results:  Relevant Lab Results:   Additional Information SS#: [id number]  [name], LCSW

## 2017-02-02 NOTE — Clinical Social Work Placement (Signed)
   CLINICAL SOCIAL WORK PLACEMENT  NOTE  Date:  02/02/2017  Patient Details  Name: Lawrence [name] MRN: [id number] Date of Birth: 17-Feb-1956  Clinical Social Work is seeking post-discharge placement for this patient at the [place] level of care (*CSW will initial, date and re-position this form in  chart as items are completed):  Yes   Patient/family provided with [place] Work Department's list of facilities offering this level of care within the geographic area requested by the patient (or if unable, by the patient's family).  Yes   Patient/family informed of their freedom to choose among providers that offer the needed level of care, that participate in Medicare, Medicaid or managed care program needed by the patient, have an available bed and are willing to accept the patient.  Yes   Patient/family informed of [facility]'s ownership interest in [facility] and [facility], as well as of the fact that they are under no obligation to receive care at these facilities.  PASRR submitted to EDS on 02/02/17     PASRR number received on       Existing PASRR number confirmed on       FL2 transmitted to all facilities in geographic area requested by pt/family on 02/02/17     FL2 transmitted to all facilities within larger geographic area on       Patient informed that his/her managed care company has contracts with or will negotiate with certain facilities, including the following:            Patient/family informed of bed offers received.  Patient chooses bed at       Physician recommends and patient chooses bed at      Patient to be transferred to   on  .  Patient to be transferred to facility by       Patient family notified on   of transfer.  Name of family member notified:        PHYSICIAN Please sign FL2, Please sign DNR     Additional Comment:    _______________________________________________ [name],  LCSW 02/02/2017, 3:02 PM

## 2017-02-02 NOTE — Consult Note (Signed)
[facility] Nurse wound consult note Reason for Consult: LE cellulitis with venous stasis dermatitis and open wounds that are pustule in appearance and patient reports severe pruritis Patient reports chronic LE edema. Currently with 4+ pitting edema and weeping with ulcerations and pustules over both calfs/pretibial region. Wound type: cellulitis with fungal component and venous stasis dx.  Measurement: scattered lesions all 0.5cm x 0.5cm or less Wound bed: most lesions are yellow Drainage (amount, consistency, odor) serous, and pustules Periwound: edema and erythema  Dressing procedure/placement/frequency: Recommended Unna's boots for compression, ABIs are normal. Zinc and Calamine based paste with coban. Zinc and calamine will sooth dermatitis and dry ulcers.  If compression to be held off for now to observe cellulitis, would use xeroform for antibacterial effects.   Contacted family medicine on call and they are directing wound care at this time. Will not add any topical wound care or compression therapy at this time.    Re consult if needed, will not follow at this time. Thanks  Lavella Myren [employer], RN,CWOCN, CNS, [place] [phone])

## 2017-02-02 NOTE — Clinical Social Work Note (Signed)
Clinical Social Work Assessment  Patient Details  Name: Lawrence [name] MRN: [id number] Date of Birth: 09-Dec-1955  Date of referral:  02/02/17               Reason for consult:  Facility Placement, Discharge Planning                Permission sought to share information with:  Facility Sport and exercise psychologist, Family Supports Permission granted to share information::  Yes, Verbal Permission Granted  Name::     Crista Curb  Agency::  SNF's  Relationship::  Significant Other  Contact Information:  [phone]  Housing/Transportation Living arrangements for the past 2 months:  Apartment Source of Information:  Patient, Medical Team, Partner Patient Interpreter Needed:  None Criminal Activity/Legal Involvement Pertinent to Current Situation/Hospitalization:  No - Comment as needed Significant Relationships:  Adult Children, Significant Other, Other Family Members Lives with:  Significant Other Do you feel safe going back to the place where you live?  Yes Need for family participation in patient care:  Yes (Comment)  Care giving concerns:  PT recommending SNF once medically stable for discharge.   Social Worker assessment / plan:  CSW met with patient. Significant other, Ms. Ayott, at bedside. CSW introduced role and explained that PT recommendations would be discussed. Patient and Ms. Ayott agreeable to SNF placement. [facility] is first preference. CSW has left message for admissions coordinator to find out their weight limit. This may limit bed offers. Patient and Ms. Ayott want to be each other's HCPOA's. CSW asked RN to put in chaplain consult. No further concerns. CSW encouraged patient and Ms. Ayott to contact CSW as needed. CSW will continue to follow patient and Ms. Ayott for support and facilitate discharge to SNF once medically stable.  Employment status:  [employer] information:  Other (Comment Required) Nurse, mental health) PT Recommendations:  [place] / Referral to community resources:  [place]  Patient/Family's Response to care:  Patient and Ms. Ayott agreeable to SNF placement. Patient's family supportive and involved in patient's care. Patient and Ms. Ayott appreciated social work intervention.  Patient/Family's Understanding of and Emotional Response to Diagnosis, Current Treatment, and Prognosis:  Patient and Ms. Ayott have a good understanding of the reason for admission and his need for rehab prior to returning home. Ms. Ayott is trying to get patient an apartment on the ground floor so he does not have to climb stairs. Patient and Ms. Ayott appear happy with hospital care.  Emotional Assessment Appearance:  Appears stated age Attitude/Demeanor/Rapport:  Other (Pleasant) Affect (typically observed):  Accepting, Appropriate, Calm, Pleasant Orientation:  Oriented to Self, Oriented to Place, Oriented to  Time, Oriented to Situation Alcohol / Substance use:  Never Used Psych involvement (Current and /or in the community):  No (Comment)  Discharge Needs  Concerns to be addressed:  Care Coordination Readmission within the last 30 days:  No Current discharge risk:  Dependent with Mobility Barriers to Discharge:  Continued Medical Work up, Ship broker, Other (Patient's weight will likely limit bed offers.)   [name], LCSW 02/02/2017, 2:59 PM

## 2017-02-03 LAB — GLUCOSE, CAPILLARY: Glucose-Capillary: 125 mg/dL — ABNORMAL HIGH (ref 65–99)

## 2017-02-03 LAB — BASIC METABOLIC PANEL
Anion gap: 11 (ref 5–15)
BUN: 21 mg/dL — ABNORMAL HIGH (ref 6–20)
CO2: 27 mmol/L (ref 22–32)
Calcium: 8.4 mg/dL — ABNORMAL LOW (ref 8.9–10.3)
Chloride: 93 mmol/L — ABNORMAL LOW (ref 101–111)
Creatinine, Ser: 0.82 mg/dL (ref 0.61–1.24)
GFR calc Af Amer: >60 mL/min (ref >60)
GFR calc non Af Amer: >60 mL/min (ref >60)
Glucose, Bld: 139 mg/dL — ABNORMAL HIGH (ref 65–99)
Potassium: 3.5 mmol/L (ref 3.5–5.1)
Sodium: 131 mmol/L — ABNORMAL LOW (ref 135–145)

## 2017-02-03 NOTE — Progress Notes (Signed)
Family Medicine Teaching Service Daily Progress Note Intern Pager: [phone]  Patient name: Lawrence [name] Medical record number: [id number] Date of birth: [birth year] Age: 61 y.o. Gender: male  Primary Care Provider: [place] Consultants: PT/OT/WOC. Chaplain Code Status: DNR  Pt Overview and Major Events to Date:  6/26- admitted to FPTS   Assessment and Plan: Lawrence [name] is a 61 y.o. male presenting with swelling up to abdomen . PMH is significant for COPD, HTN, morbid obesity, OA (R knee, s/p replacement of L knee), OSA, PTSD, Depression, anxiety.   #LE edema, moderate/severe, improving  Patient thinks his dry weight is around 374, on admission weight was 427 lb. This am UOP is -3118, weight 422 pounds this morning. Echo demonstrates G1DD with normal EF. No DVT on LE dopplers. -continue lasix 80 mg IV bid -strict I&O -Daily weights   -PT/OT recommending SNF vs HH -Elevate scrotum q shift - compress legs - treat cellulitis as below  #Purulent LE Cellulitis 2/2 significant LE edema Stable vitals signs overnight. WBC 11.4>12.1. Concern for bacterial infection with possible fungal component. Would consider broadening coverage given rise in WBCs.  -Wound care consulted  -s/p vanc for cellulitis, stopped due to rash under arms -apply topical bactroban to legs BID -d/c nystatin powder -add compression to legs today   #Episodic Chest Pain Likely secondary to anxiety. Trop 0.03>0.03>0.03>0.0.3>0.06. -Treat anxiety as below -Monitor closely  #COPD recent diagnosis, 45 pack year smoking history. No current medications. Reports albuterol in past has not helped breathing. Not in current exacerbation. -Albuterol q4 as needed  -Consider starting controller medication this hospitalization  #HTN BP 137/61 this morning. -Continue HCTZ 12.5 mg daily -Continue Losartan  25 mg daily   #Chronic pain Secondary to leg pain, will continue home regimen. -Continue gabapentin   gabapentin 600 mg am, and 300 mg pm  #Allergies On zyrtec 10 mg qhs -Continue Loratadine 10 mg  qhs  #Nightmares -Continue home medication Prazosin 4 mg qhs  #OSA supposed to be on CPAP but mask ill-fitting at home and may be contributing to nightmares. Could not tolerate night of admission due to anxiety. -CPAP qhs  #Chronic diarrhea -Continue loperamide 2 mg bid  #PTSD/Depression/Anxiety -Continue Aripiprazole 10 mg qhs -Continue Trazodone 100 mg qhs -Continue Venlafaxine 150 mg in am  FEN/GI: heart healthy diet Prophylaxis: lovenox  Disposition: pending clinical improvement, d/c to SNF  Subjective:  Doing well, has itching "all over" but feels this has improved. Breathing is the same. No fevers, chills, chest pain.   Objective: Temp:  [97.4 F (36.3 C)-98.9 F (37.2 C)] 98.9 F (37.2 C) (06/29 0514) Pulse Rate:  [94-98] 98 (06/29 0514) Resp:  [18] 18 (06/29 0514) BP: (109-148)/(59-80) 148/80 (06/29 0514) SpO2:  [96 %-97 %] 97 % (06/29 0514) Weight:  [422 lb 4.8 oz (191.6 kg)] 422 lb 4.8 oz (191.6 kg) (06/29 0514)  Physical Exam: General: morbidly obese man sitting in the chair in NAD,  Cardiovascular: RRR. 3/6 systolic murmur best heard at left sternal border Respiratory: bibasilar crackles, no increased WOB Abdomen: obese. Non tender. Distended. Erythema within previously marked lines. Extremities: purulent drainage to anterior tibia bilaterally, significant edema bilaterally with mild hyperpigmentation consistent with venous stasis.  Laboratory:  Recent Labs Lab 01/31/17 1048 02/01/17 0602 02/02/17 0014  WBC 12.7* 11.4* 12.1*  HGB 13.0 11.9* 12.0*  HCT 40.4 37.5* 37.3*  PLT 260 255 255    Recent Labs Lab 01/31/17 1048 02/01/17 0602 02/02/17 0014 02/03/17 0351  NA 133* 134*  131* 131*  K 3.9 4.0 4.0 3.5  CL 95* 98* 97* 93*  CO2 26 28 26 27   BUN 27* 20 21* 21*  CREATININE 1.16 0.86 0.83 0.82  CALCIUM 9.0 8.3* 8.2* 8.4*  PROT 6.8  --   --    --   BILITOT 0.5  --   --   --   ALKPHOS 63  --   --   --   ALT 22  --   --   --   AST 21  --   --   --   GLUCOSE 138* 119* 125* 139*   BNP 17.8 Trop 0.03 TSH 1.689  Right ABI of 0.96 and left ABI of 0.98 suggestive of arterial flow within normal limits at rest  VAS [place] LE Summary:  - No evidence of deep vein thrombosis involving the visualized   veins of the right lower extremity. - No evidence of deep vein thrombosis involving the visualized   veins of the left lower extremity. - . Incidental findings are consistent with: enlarged lymph node on   the right and enlarged lymph node on the left. - No evidence of Baker&'s cyst on the right or left.  ECHO Study Conclusions  - Left ventricle: The cavity size was normal. There was mild   concentric hypertrophy. Systolic function was normal. The   estimated ejection fraction was in the range of 60% to 65%. Wall   motion was normal; there were no regional wall motion   abnormalities. Doppler parameters are consistent with abnormal   left ventricular relaxation (grade 1 diastolic dysfunction). - Aortic valve: Transvalvular velocity was increased, due to   stenosis. There was mild stenosis. There was trivial   regurgitation. Valve area (VTI): 1.86 cm^2. Valve area (Vmax):   1.77 cm^2. Valve area (Vmean): 1.82 cm^2. - Mitral valve: Calcified annulus. There was mild regurgitation. - Left atrium: The atrium was moderately to severely dilated. - Right atrium: The atrium was mildly dilated. - Pericardium, extracardiac: A trivial pericardial effusion was   identified.  Imaging/Diagnostic Tests: Dg Chest 2 View  Result Date: 01/31/2017 CLINICAL DATA:  Four weeks of shortness of breath which has worsened today. Suspect CHF. EXAM: CHEST  2 VIEW COMPARISON:  None in PACs FINDINGS: The lungs are well-expanded. There is no focal infiltrate. The cardiac silhouette is enlarged. The pulmonary vascularity while mildly prominent centrally do  is not clearly cephalized. The mediastinum is normal in width. IMPRESSION: Mild chronic bronchitic-smoking related changes. Cardiomegaly with central pulmonary vascular congestion. No definite pulmonary edema or pneumonia. Electronically Signed   By: David  Martinique M.D.   On: 01/31/2017 13:39    Steve Rattler, DO 02/03/2017, 8:43 AM PGY-1, [place] Intern pager: [phone], text pages welcome

## 2017-02-03 NOTE — Progress Notes (Signed)
Orthopedic Tech Progress Note Patient Details:  Chares Slaymaker Aug 30, 1955 [id number]  Ortho Devices Type of Ortho Device: Ace wrap, Doran Durand splint Ortho Device/Splint Location: bilateral   Hildred Priest 02/03/2017, 2:48 PM

## 2017-02-03 NOTE — Progress Notes (Signed)
   Per patient request, Advanced Directive documentation left at bedside.  If/when patient decides to move forward with AD, please contact the spiritual care department or page on-call chaplain (Mon-Fri 9AM-3PM)  Will follow, as needed.

## 2017-02-03 NOTE — Progress Notes (Signed)
Orthopedic Tech Progress Note Patient Details:  Lawrence [name] [birth year] [id number]  Patient ID: Lawrence [name], male   DOB: September 03, 1955, 61 y.o.   MRN: [id number]   Lawrence [name] 02/03/2017, 2:49 PM Viewed order from doctor's order list

## 2017-02-03 NOTE — Progress Notes (Signed)
   02/03/17 2253  BiPAP/CPAP/SIPAP  BiPAP/CPAP/SIPAP Pt Type Adult  Mask Type Full face mask  Mask Size Large  Flow Rate 2 lpm

## 2017-02-03 NOTE — Plan of Care (Signed)
Problem: Activity: Goal: Risk for activity intolerance will decrease Outcome: Progressing Walked 40 feet out of room with front rolling walker

## 2017-02-03 NOTE — Progress Notes (Signed)
Ortho Notified about Bilateral compression devices.

## 2017-02-03 NOTE — Clinical Social Work Note (Addendum)
CSW provided patient with bed offers. Lawrence [name] unable to offer because their weight limit is 400 lbs. He inquired about [name]. CSW has asked hospital liason for [name] and [name] to meet with patient and provide more information on their facilities. She will come by to see him today.  [name], [name] [phone]  11:12 am Patient's PASARR requires 30 day note. MD aware that CSW is putting it on the chart for them to sign.  [name], [name] [phone]  11:35 am Patient spoke with hospital liaison for [name] and [name]. Patient has chosen [name]. They will start insurance authorization today but likely will not have it until Monday.   [name], [name]

## 2017-02-04 ENCOUNTER — Inpatient Hospital Stay (HOSPITAL_COMMUNITY): Payer: Non-veteran care

## 2017-02-04 LAB — CBC
HCT: 37.9 % — ABNORMAL LOW (ref 39.0–52.0)
Hemoglobin: 12.4 g/dL — ABNORMAL LOW (ref 13.0–17.0)
MCH: 28.9 pg (ref 26.0–34.0)
MCHC: 32.7 g/dL (ref 30.0–36.0)
MCV: 88.3 fL (ref 78.0–100.0)
Platelets: 277 10*3/uL (ref 150–400)
RBC: 4.29 MIL/uL (ref 4.22–5.81)
RDW: 14.8 % (ref 11.5–15.5)
WBC: 12.4 10*3/uL — ABNORMAL HIGH (ref 4.0–10.5)

## 2017-02-04 LAB — PSA: Prostatic Specific Antigen: 0.65 ng/mL (ref 0.00–4.00)

## 2017-02-04 LAB — BASIC METABOLIC PANEL
Anion gap: 9 (ref 5–15)
BUN: 17 mg/dL (ref 6–20)
CO2: 29 mmol/L (ref 22–32)
Calcium: 8.6 mg/dL — ABNORMAL LOW (ref 8.9–10.3)
Chloride: 94 mmol/L — ABNORMAL LOW (ref 101–111)
Creatinine, Ser: 0.85 mg/dL (ref 0.61–1.24)
GFR calc Af Amer: >60 mL/min (ref >60)
GFR calc non Af Amer: >60 mL/min (ref >60)
Glucose, Bld: 150 mg/dL — ABNORMAL HIGH (ref 65–99)
Potassium: 3.3 mmol/L — ABNORMAL LOW (ref 3.5–5.1)
Sodium: 132 mmol/L — ABNORMAL LOW (ref 135–145)

## 2017-02-04 MED ORDER — CAMPHOR-MENTHOL 0.5-0.5 % EX LOTN
TOPICAL_LOTION | Freq: Two times a day (BID) | CUTANEOUS | Status: DC
  Administered 2017-02-04 – 2017-02-07 (×7): via TOPICAL
  Filled 2017-02-04 (×2): qty 222

## 2017-02-04 MED ORDER — POTASSIUM CHLORIDE CRYS ER 20 MEQ PO TBCR
40.0000 meq | EXTENDED_RELEASE_TABLET | ORAL | Status: AC
  Administered 2017-02-04 (×2): 40 meq via ORAL
  Filled 2017-02-04 (×2): qty 2

## 2017-02-04 MED ORDER — TORSEMIDE 20 MG PO TABS
20.0000 mg | ORAL_TABLET | Freq: Two times a day (BID) | ORAL | Status: DC
  Administered 2017-02-04 – 2017-02-07 (×7): 20 mg via ORAL
  Filled 2017-02-04 (×7): qty 1

## 2017-02-04 NOTE — Progress Notes (Signed)
RT placed patient on CPAP HS auto. 2L O2 bleed in needed. Patient tolerating well.  °

## 2017-02-04 NOTE — Progress Notes (Signed)
Family Medicine Teaching Service Daily Progress Note Intern Pager: [phone]  Patient name: Lawrence [name] Medical record number: [id number] Date of birth: 1956-04-23 Age: 61 y.o. Gender: male  Primary Care Provider: [facility]: PT/OT/WOC. Chaplain Code Status: DNR  Pt Overview and Major Events to Date:  6/26- admitted to FPTS   Assessment and Plan: Lawrence [name] is a 61 y.o. male presenting with swelling up to abdomen . PMH is significant for COPD, HTN, morbid obesity, OA (R knee, s/p replacement of L knee), OSA, PTSD, Depression, anxiety.   #LE edema, moderate/severe, improving  Patient thinks his dry weight is around 374, on admission weight was 427 lb. This am UOP is -4698, weight down to 418 pounds. Echo demonstrates G1DD with normal EF. No DVT on LE dopplers. -continue lasix 80 mg IV bid, anticipate transition to oral Lasix possibly tomorrow -monitor kidney function while diuresing -strict I&Os, Daily weights   -Elevate scrotum q shift - compress legs - treat cellulitis as below - PT/OT recommending SNF, CSW following for placement  #Purulent LE Cellulitis 2/2 significant LE edema Stable vitals signs overnight. WBC 11.4>12.4 -Wound care consulted  -s/p vanc for cellulitis, stopped due to rash under arms -apply topical bactroban to legs BID -add compression to legs   #Rash- persistent. pruritic lacy morbiliform erythematous rash under arms bilaterally, over stomach and legs. Partly present on admission but rash under arms developed after Vanc infusion so this was stopped. Has not improved or worsened -benadryl as needed for itching  #Episodic Chest Pain- resolved. Likely secondary to anxiety. Trop 0.03>0.03>0.03>0.0.3>0.06. -Treat anxiety as below -Monitor closely  #COPD -Albuterol q4 as needed  -Consider starting controller medication this hospitalization  #HTN- stable. BP 135/60 this morning. -Continue HCTZ 12.5 mg daily -Continue Losartan   25 mg daily   #Chronic pain- Secondary to leg pain, will continue home regimen. -Continue gabapentin  gabapentin 600 mg am, and 300 mg pm  #Allergies On zyrtec 10 mg qhs -Continue Loratadine 10 mg  qhs  #Nightmares -Continue home medication Prazosin 4 mg qhs  #OSA -CPAP qhs  #Chronic diarrhea -Continue loperamide 2 mg bid  #PTSD/Depression/Anxiety -Continue Aripiprazole 10 mg qhs -Continue Trazodone 100 mg qhs -Continue Venlafaxine 150 mg in am  FEN/GI: heart healthy diet Prophylaxis: lovenox  Disposition: pending clinical improvement, d/c to SNF  Subjective:  Doing well, feels improved overall. Ambulated in hall and felt DOE was much improved from home. Itching persists especially under arms. No fevers or chills.    Objective: Temp:  [97.7 F (36.5 C)-98.2 F (36.8 C)] 98.2 F (36.8 C) (06/30 0427) Pulse Rate:  [91-94] 94 (06/30 0427) Resp:  [18] 18 (06/30 0427) BP: (129-135)/(60-85) 135/60 (06/30 0427) SpO2:  [92 %-94 %] 94 % (06/30 0427) Weight:  [418 lb 3.2 oz (189.7 kg)] 418 lb 3.2 oz (189.7 kg) (06/30 0427)  Physical Exam: General: morbidly obese man sitting in the chair in NAD,  Cardiovascular: RRR. 3/6 systolic murmur best heard at left sternal border Respiratory: bibasilar crackles, no increased WOB Abdomen: obese. Non tender. Distended. Erythema within previously marked lines. Extremities: ace wraps applied bilaterally. Some drainage to superficial bullae bilaterally, mild edema bilaterally   Laboratory:  Recent Labs Lab 02/01/17 0602 02/02/17 0014 02/04/17 0243  WBC 11.4* 12.1* 12.4*  HGB 11.9* 12.0* 12.4*  HCT 37.5* 37.3* 37.9*  PLT 255 255 277    Recent Labs Lab 01/31/17 1048  02/02/17 0014 02/03/17 0351 02/04/17 0243  NA 133*  < > 131* 131* 132*  K 3.9  < > 4.0 3.5 3.3*  CL 95*  < > 97* 93* 94*  CO2 26  < > 26 27 29   BUN 27*  < > 21* 21* 17  CREATININE 1.16  < > 0.83 0.82 0.85  CALCIUM 9.0  < > 8.2* 8.4* 8.6*  PROT 6.8  --    --   --   --   BILITOT 0.5  --   --   --   --   ALKPHOS 63  --   --   --   --   ALT 22  --   --   --   --   AST 21  --   --   --   --   GLUCOSE 138*  < > 125* 139* 150*  < > = values in this interval not displayed. BNP 17.8 Trop 0.03 TSH 1.689  Right ABI of 0.96 and left ABI of 0.98 suggestive of arterial flow within normal limits at rest  VAS [place] LE Summary:  - No evidence of deep vein thrombosis involving the visualized   veins of the right lower extremity. - No evidence of deep vein thrombosis involving the visualized   veins of the left lower extremity. - . Incidental findings are consistent with: enlarged lymph node on   the right and enlarged lymph node on the left. - No evidence of Baker&'s cyst on the right or left.  ECHO Study Conclusions  - Left ventricle: The cavity size was normal. There was mild   concentric hypertrophy. Systolic function was normal. The   estimated ejection fraction was in the range of 60% to 65%. Wall   motion was normal; there were no regional wall motion   abnormalities. Doppler parameters are consistent with abnormal   left ventricular relaxation (grade 1 diastolic dysfunction). - Aortic valve: Transvalvular velocity was increased, due to   stenosis. There was mild stenosis. There was trivial   regurgitation. Valve area (VTI): 1.86 cm^2. Valve area (Vmax):   1.77 cm^2. Valve area (Vmean): 1.82 cm^2. - Mitral valve: Calcified annulus. There was mild regurgitation. - Left atrium: The atrium was moderately to severely dilated. - Right atrium: The atrium was mildly dilated. - Pericardium, extracardiac: A trivial pericardial effusion was   identified.  Imaging/Diagnostic Tests: Dg Chest 2 View  Result Date: 01/31/2017 CLINICAL DATA:  Four weeks of shortness of breath which has worsened today. Suspect CHF. EXAM: CHEST  2 VIEW COMPARISON:  None in PACs FINDINGS: The lungs are well-expanded. There is no focal infiltrate. The cardiac silhouette  is enlarged. The pulmonary vascularity while mildly prominent centrally do is not clearly cephalized. The mediastinum is normal in width. IMPRESSION: Mild chronic bronchitic-smoking related changes. Cardiomegaly with central pulmonary vascular congestion. No definite pulmonary edema or pneumonia. Electronically Signed   By: David  Martinique M.D.   On: 01/31/2017 13:39    Steve Rattler, DO 02/04/2017, 7:19 AM PGY-1, [place] Intern pager: [phone], text pages welcome

## 2017-02-05 LAB — CBC
HCT: 39.3 % (ref 39.0–52.0)
Hemoglobin: 12.7 g/dL — ABNORMAL LOW (ref 13.0–17.0)
MCH: 28.5 pg (ref 26.0–34.0)
MCHC: 32.3 g/dL (ref 30.0–36.0)
MCV: 88.1 fL (ref 78.0–100.0)
Platelets: 285 10*3/uL (ref 150–400)
RBC: 4.46 MIL/uL (ref 4.22–5.81)
RDW: 14.7 % (ref 11.5–15.5)
WBC: 13.3 10*3/uL — ABNORMAL HIGH (ref 4.0–10.5)

## 2017-02-05 LAB — BASIC METABOLIC PANEL
Anion gap: 10 (ref 5–15)
BUN: 18 mg/dL (ref 6–20)
CO2: 28 mmol/L (ref 22–32)
Calcium: 8.7 mg/dL — ABNORMAL LOW (ref 8.9–10.3)
Chloride: 91 mmol/L — ABNORMAL LOW (ref 101–111)
Creatinine, Ser: 0.86 mg/dL (ref 0.61–1.24)
GFR calc Af Amer: >60 mL/min (ref >60)
GFR calc non Af Amer: >60 mL/min (ref >60)
Glucose, Bld: 109 mg/dL — ABNORMAL HIGH (ref 65–99)
Potassium: 3.7 mmol/L (ref 3.5–5.1)
Sodium: 129 mmol/L — ABNORMAL LOW (ref 135–145)

## 2017-02-05 LAB — GLUCOSE, CAPILLARY: Glucose-Capillary: 127 mg/dL — ABNORMAL HIGH (ref 65–99)

## 2017-02-05 NOTE — Progress Notes (Signed)
Family Medicine Teaching Service Daily Progress Note Intern Pager: [phone]  Patient name: Lawrence [name] Medical record number: [id number] Date of birth: 1956/03/20 Age: 61 y.o. Gender: male  Primary Care Provider: [facility]: PT/OT/WOC. Chaplain Code Status: DNR  Pt Overview and Major Events to Date:  6/26- admitted to FPTS   Assessment and Plan: Lawrence [name] is a 61 y.o. male presenting with swelling up to abdomen . PMH is significant for COPD, HTN, morbid obesity, OA (R knee, s/p replacement of L knee), OSA, PTSD, Depression, anxiety.   #LE edema, moderate/severe, improving  Patient thinks his dry weight is around 374, on admission weight was 427 lb. This am UOP is -5499, weight down to 415 pounds. Echo with G1DD, normal EF. No DVT on LE dopplers. CT pelvis without mass/obstruction, PSA normal -transitioned to Torsemide with good diuresis, continue on this medication -monitor kidney function while diuresing -strict I&Os, Daily weights   -Elevate scrotum q shift - compress legs - treat cellulitis as below -encourage ambulation - PT/OT recommending SNF, CSW following for placement  #Purulent LE Cellulitis 2/2 significant LE edema Stable vitals signs overnight. WBC 11.4>12.4>13.3 -Wound care consulted  -s/p vanc for cellulitis, stopped due to rash under arms -apply topical bactroban to legs BID- has not been done since legs wrapped, notified RN to do this BID and re-wrap with ACE for compression -add compression to legs   #Rash- persistent. pruritic lacy morbiliform erythematous rash under arms bilaterally, over stomach and legs. Partly present on admission but rash under arms developed after Vanc infusion so this was stopped. Has not improved or worsened -benadryl as needed for itching -topical sarna BID  #Episodic Chest Pain- resolved. Likely secondary to anxiety. Trop 0.03>0.03>0.03>0.0.3>0.06. -Treat anxiety as below -Monitor  closely  #COPD -Albuterol q4 as needed  -Consider starting controller medication this hospitalization  #HTN- stable. BP 135/60 this morning. -Continue HCTZ 12.5 mg daily -Continue Losartan  25 mg daily   #Chronic pain- Secondary to leg pain, will continue home regimen. -Continue gabapentin  gabapentin 600 mg am, and 300 mg pm  #Allergies On zyrtec 10 mg qhs -Continue Loratadine 10 mg  qhs  #Nightmares -Continue home medication Prazosin 4 mg qhs  #OSA -CPAP qhs  #Chronic diarrhea -Continue loperamide 2 mg bid  #PTSD/Depression/Anxiety -Continue Aripiprazole 10 mg qhs -Continue Trazodone 100 mg qhs -Continue Venlafaxine 150 mg in am  FEN/GI: heart healthy diet Prophylaxis: lovenox  Disposition: pending clinical improvement, d/c to SNF possibly 7/2  Subjective:  Doing well, walked further last night. No SOB, chest pain. Legs are itchy. Rash improved with sarna cream. Appreciative of care  Objective: Temp:  [98 F (36.7 C)-98.4 F (36.9 C)] 98 F (36.7 C) (07/01 0621) Pulse Rate:  [85-95] 95 (07/01 0621) Resp:  [18-24] 20 (06/30 2245) BP: (115-131)/(60-78) 129/78 (07/01 0621) SpO2:  [95 %] 95 % (07/01 0621) Weight:  [415 lb 12.6 oz (188.6 kg)] 415 lb 12.6 oz (188.6 kg) (07/01 8938)  Physical Exam: General: morbidly obese man sitting in the chair in NAD,  Cardiovascular: RRR. 3/6 systolic murmur best heard at left sternal border Respiratory: CTAB. No increased WOB Abdomen: obese. Non tender. Distended. Erythema within previously marked lines. Extremities: ace wraps applied bilaterally  Laboratory:  Recent Labs Lab 02/02/17 0014 02/04/17 0243 02/05/17 0413  WBC 12.1* 12.4* 13.3*  HGB 12.0* 12.4* 12.7*  HCT 37.3* 37.9* 39.3  PLT 255 277 285    Recent Labs Lab 01/31/17 1048  02/03/17 0351 02/04/17 0243 02/05/17  0413  NA 133*  < > 131* 132* 129*  K 3.9  < > 3.5 3.3* 3.7  CL 95*  < > 93* 94* 91*  CO2 26  < > 27 29 28   BUN 27*  < > 21* 17 18   CREATININE 1.16  < > 0.82 0.85 0.86  CALCIUM 9.0  < > 8.4* 8.6* 8.7*  PROT 6.8  --   --   --   --   BILITOT 0.5  --   --   --   --   ALKPHOS 63  --   --   --   --   ALT 22  --   --   --   --   AST 21  --   --   --   --   GLUCOSE 138*  < > 139* 150* 109*  < > = values in this interval not displayed. BNP 17.8 Trop 0.03 TSH 1.689  Right ABI of 0.96 and left ABI of 0.98 suggestive of arterial flow within normal limits at rest  VAS [place] LE Summary:  - No evidence of deep vein thrombosis involving the visualized   veins of the right lower extremity. - No evidence of deep vein thrombosis involving the visualized   veins of the left lower extremity. - . Incidental findings are consistent with: enlarged lymph node on   the right and enlarged lymph node on the left. - No evidence of Baker&'s cyst on the right or left.  ECHO Study Conclusions  - Left ventricle: The cavity size was normal. There was mild   concentric hypertrophy. Systolic function was normal. The   estimated ejection fraction was in the range of 60% to 65%. Wall   motion was normal; there were no regional wall motion   abnormalities. Doppler parameters are consistent with abnormal   left ventricular relaxation (grade 1 diastolic dysfunction). - Aortic valve: Transvalvular velocity was increased, due to   stenosis. There was mild stenosis. There was trivial   regurgitation. Valve area (VTI): 1.86 cm^2. Valve area (Vmax):   1.77 cm^2. Valve area (Vmean): 1.82 cm^2. - Mitral valve: Calcified annulus. There was mild regurgitation. - Left atrium: The atrium was moderately to severely dilated. - Right atrium: The atrium was mildly dilated. - Pericardium, extracardiac: A trivial pericardial effusion was   identified.  Imaging/Diagnostic Tests: Ct Pelvis Wo Contrast  Result Date: 02/04/2017 CLINICAL DATA:  Initial evaluation for swelling and pelvis in legs with pain in both hips. EXAM: CT PELVIS WITHOUT CONTRAST  TECHNIQUE: Multidetector CT imaging of the pelvis was performed following the standard protocol without intravenous contrast. COMPARISON:  None. FINDINGS: Urinary Tract: Bladder partially distended without acute abnormality. Visualized ureters within normal limits. Bowel: Visualized portions of the bowel are unremarkable without evidence for obstruction or acute inflammation. Appendix visualized within the right lower quadrant and is of normal caliber and appearance associated inflammatory changes to suggest acute appendicitis. Vascular/Lymphatic: Mild scattered atheromatous plaque within the iliac arteries bilaterally, slightly greater on the left. No aneurysm or other vascular abnormality. Mildly prominent external iliac lymph nodes measure up to 16 mm on the left (series 3, image 68), and 14 mm on the right (series 3, image 60). Bilateral inguinal lymph nodes measure up to 2.8 cm on the left (series 3, image 154) and 2.4 cm on the right (series 3, image 151). These are indeterminate, and may be reactive in nature. Reproductive: Prostate within normal limits for size. Dystrophic calcification noted  within the central aspect of the prostate. Other: No free air or fluid within the pelvis. Small fat containing bilateral inguinal hernias noted without associated inflammation, slightly larger on the right. Musculoskeletal: Nonspecific mild subcutaneous edema at noted within the lateral aspect of the visualized upper thighs. Small focus of soft tissue emphysema within the subcutaneous fat of the left ventral abdomen likely related to injection site. No acute osseous abnormality. No worrisome lytic or blastic osseous lesions. Mild moderate degenerative osteoarthritic changes present about the hips bilaterally, left slightly worse than right. SI joints approximated and symmetric. IMPRESSION: 1. No acute abnormality identified within the pelvis. 2. Mildly enlarged bilateral external iliac and inguinal lymph nodes as above,  nonspecific, but may be reactive in nature. 3. Nonspecific subcutaneous edema within the lateral aspect of the visualized proximal thighs. 4. Mild to moderate degenerative osteoarthrosis about the hips, left slightly worse than right. 5. Mild atherosclerosis. Electronically Signed   By: Jeannine Boga M.D.   On: 02/04/2017 20:28    Steve Rattler, DO 02/05/2017, 8:52 AM PGY-2, [facility] Intern pager: [phone], text pages welcome

## 2017-02-05 NOTE — Progress Notes (Signed)
Orthopedic Tech Progress Note Patient Details:  Lawrence [name] 1956-03-12 [id number]  Ortho Devices Type of Ortho Device: Ace wrap Ortho Device/Splint Location: Ace wrap with stockinette Ortho Device/Splint Interventions: Application   [name] 02/05/2017, 1:03 PM

## 2017-02-06 ENCOUNTER — Inpatient Hospital Stay (HOSPITAL_COMMUNITY): Payer: Non-veteran care

## 2017-02-06 LAB — URINALYSIS, MICROSCOPIC (REFLEX)

## 2017-02-06 LAB — GLUCOSE, CAPILLARY: Glucose-Capillary: 126 mg/dL — ABNORMAL HIGH (ref 65–99)

## 2017-02-06 LAB — BASIC METABOLIC PANEL
Anion gap: 9 (ref 5–15)
BUN: 19 mg/dL (ref 6–20)
CO2: 29 mmol/L (ref 22–32)
Calcium: 8.8 mg/dL — ABNORMAL LOW (ref 8.9–10.3)
Chloride: 90 mmol/L — ABNORMAL LOW (ref 101–111)
Creatinine, Ser: 0.9 mg/dL (ref 0.61–1.24)
GFR calc Af Amer: >60 mL/min (ref >60)
GFR calc non Af Amer: >60 mL/min (ref >60)
Glucose, Bld: 114 mg/dL — ABNORMAL HIGH (ref 65–99)
Potassium: 3.4 mmol/L — ABNORMAL LOW (ref 3.5–5.1)
Sodium: 128 mmol/L — ABNORMAL LOW (ref 135–145)

## 2017-02-06 LAB — URINALYSIS, ROUTINE W REFLEX MICROSCOPIC

## 2017-02-06 MED ORDER — MUPIROCIN CALCIUM 2 % EX CREA: TOPICAL_CREAM | Freq: Two times a day (BID) | CUTANEOUS | 0 refills | Status: DC

## 2017-02-06 MED ORDER — TORSEMIDE 20 MG PO TABS
20.0000 mg | ORAL_TABLET | Freq: Two times a day (BID) | ORAL | 0 refills | Status: DC
Start: 2017-02-06 — End: 2017-11-09

## 2017-02-06 NOTE — Progress Notes (Signed)
Orthopedic Tech Progress Note Patient Details:  Lawrence [name] 06/28/1956 [id number] Ortho visit roll of stockinette. Patient ID: Lawrence [name], male   DOB: 25-Jan-1956, 61 y.o.   MRN: [id number]   Lawrence [name] 02/06/2017, 6:31 PM

## 2017-02-06 NOTE — Progress Notes (Signed)
Physical Therapy Treatment Patient Details Name: Lawrence [name] MRN: [id number] DOB: 08/27/1955 Today's Date: 02/06/2017    History of Present Illness 61 yo male admitted presenting with swelling up to abdomen and cellulitis . PMH is significant for COPD, HTN, morbid obesity, OA (R knee, s/p replacement of L knee), OSA, PTSD, Depression, anxiety.     PT Comments    Pt is making good progress towards his goals. Pt currently, min guard for transfers and ambulation of 150 feet with RW requiring 3x standing rest breaks for 2/4 DoE. Pt SaO2 remained above 89%O2 on RA throughout session. Pt requires skilled PT to progress gait training and to improve LE strength and endurance to safely mobilize in his discharge environment.     Follow Up Recommendations  SNF     Equipment Recommendations  Other (comment) (rollator walker)       Precautions / Restrictions Precautions Precautions: Fall Precaution Comments: oxygen sats watch HR Restrictions Weight Bearing Restrictions: No    Mobility  Bed Mobility               General bed mobility comments: in recliner at entry  Transfers Overall transfer level: Needs assistance Equipment used: Rolling walker (2 wheeled) Transfers: Sit to/from Stand Sit to Stand: Min guard         General transfer comment: min guard for safety   Ambulation/Gait Ambulation/Gait assistance: Min guard Ambulation Distance (Feet): 150 Feet Assistive device: Rolling walker (2 wheeled) Gait Pattern/deviations: Step-through pattern;Decreased stride length;Wide base of support;Shuffle Gait velocity: reduced Gait velocity interpretation: Below normal speed for age/gender General Gait Details: min guard for safety, slow, steady gait with RW, 3x standing rest breaks for 2/4 DoE         Balance Overall balance assessment: Needs assistance Sitting-balance support: Bilateral upper extremity supported;Feet supported Sitting balance-Leahy Scale: Fair      Standing balance support: No upper extremity supported Standing balance-Leahy Scale: Good Standing balance comment: able to balance withot RW                             Cognition Arousal/Alertness: Awake/alert Behavior During Therapy: WFL for tasks assessed/performed Overall Cognitive Status: Within Functional Limits for tasks assessed                                        Exercises General Exercises - Lower Extremity Ankle Circles/Pumps: AROM;Both;10 reps;Seated Long Arc Quad: AROM;Both;10 reps;Seated Hip Flexion/Marching: AROM;Both;10 reps;Seated    General Comments General comments (skin integrity, edema, etc.): Pt ambulated on RA, at rest SaO2 92% O2, HR 89 bpm, throughout activity SaO2 did not drop below 89%O2 on RA, HR 115 bpm, SaO2 recovered to 92%O2 and HR to 95 within 1 minute seated after ambulation      Pertinent Vitals/Pain Pain Assessment: 0-10 Pain Score: 4  Pain Location: bilateral knees Pain Descriptors / Indicators: Aching;Burning;Sore Pain Intervention(s): Monitored during session;Limited activity within patient's tolerance           PT Goals (current goals can now be found in the care plan section) Acute Rehab PT Goals PT Goal Formulation: With patient Time For Goal Achievement: 02/15/17 Potential to Achieve Goals: Good Progress towards PT goals: Progressing toward goals    Frequency    Min 3X/week      PT Plan Current plan remains appropriate  AM-PAC PT "6 Clicks" Daily Activity  Outcome Measure  Difficulty turning over in bed (including adjusting bedclothes, sheets and blankets)?: Total Difficulty moving from lying on back to sitting on the side of the bed? : Total Difficulty sitting down on and standing up from a chair with arms (e.g., wheelchair, bedside commode, etc,.)?: A Lot Help needed moving to and from a bed to chair (including a wheelchair)?: A Lot Help needed walking in hospital room?: A  Little Help needed climbing 3-5 steps with a railing? : Total 6 Click Score: 10    End of Session   Activity Tolerance: Patient tolerated treatment well;Patient limited by fatigue Patient left: in chair;with call bell/phone within reach;with family/visitor present Nurse Communication: Mobility status PT Visit Diagnosis: Unsteadiness on feet (R26.81);Muscle weakness (generalized) (M62.81);Difficulty in walking, not elsewhere classified (R26.2)     Time: 1021-1173 PT Time Calculation (min) (ACUTE ONLY): 16 min  Charges:  $Gait Training: 8-22 mins                    G Codes:  Functional Assessment Tool Used: AM-PAC 6 Clicks Basic Mobility Functional Limitation: Mobility: Walking and moving around Mobility: Walking and Moving Around Current Status (V6701): At least 60 percent but less than 80 percent impaired, limited or restricted Mobility: Walking and Moving Around Goal Status [phone]): At least 20 percent but less than 40 percent impaired, limited or restricted    Benjamine Mola B. Migdalia Dk PT, DPT Acute Rehabilitation  [phone] Pager [phone]     [place] 02/06/2017, 9:57 AM

## 2017-02-06 NOTE — Discharge Instructions (Signed)
We have changed your fluid pills (diuretics). Please STOP taking Lasix (furosemide), and START taking Demadex (torsemide) 20 mg (one tablet) twice a day.   Please apply the antibiotic ointment (Bactroban) to your legs twice a day.   Please continue taking all other medications as prescribed prior to admission.

## 2017-02-06 NOTE — Progress Notes (Signed)
Family Medicine Teaching Service Daily Progress Note Intern Pager: [phone]  Patient name: Lawrence [name] Medical record number: [id number] Date of birth: 1956/07/09 Age: 61 y.o. Gender: male  Primary Care Provider: [facility] Consultants: PT/OT/WOC. Chaplain   Code Status: DNR  Pt Overview and Major Events to Date:  6/26-admitted to FPTS  Assessment and Plan:  [name] is a 61 yo male presenting with swelling and rash up to abdomen. PMH is significant for COPD, HTN, obesity, OA (R knee, s/p replacement of L knee), PTSD, Depression, and anxiety.   1. Lower extremity edema, improving  Patient's wife states his previous weight prior to admission was 350 lbs, on admission weight was 427 lbs. This am UOP was -6204. Most current weight is 414 lbs and 8 oz. Echo showed normal EF. No DVT on LE dopplers. CT pelvis showed no masses or obstruction. PSA normal  -Patient is obtaining good diuresis on Torsemide, continue on medication and d/c on oral torsemide  -monitor kidney function  -strict intake and output  -daily weight  -elevate scrotum q shift  -treat cellulitis as described below -encourage ambulation  -PT/OT at SNF placement   2. Purulent Cellulitis 2/2 significant LE edema  Vitals stable overnight (T99, P95, R18, BP139/72) WBCs 11.4 > 12.1 > 12.4 > 13.3 -Wound care consulted  -stopped vancomycin due to rash in upper extremities  -continue topical bactroban to legs BID and wrap with ACE wrap -continue compression   3. Rash Pruritic lacy erythematous rash in upper and lower extremities as well as abdomen. Rash in arms developed after vancomycin infusion so vancomycin was d/c. Has improved  -benedryl prn for itching -topical sarna BID  4. Urinary odor and color change  Patient reports odor and color change in odor.  -UA prior to discharge   5. Episodic Chest Pain - resolved.  Likely due to anxiety. Trop 0.03>0.03>0.03>0.0.3>0.06. -Continue to monitor   6.  COPD Patient reports no difficulty in breathing  -Continue albuterol q4 prn  -Consider controller medication   7. HTN Stable. BP 139/72 -Continue HCTZ -Continue Losartan  8. Chronic pain Secondary to leg pain  Continue home regiment   9. Allergies Continue Loratadine Continue Zyrtec   10. Nightmares Continue home Prazosin   10. OSA Patient states improvement with CPAP -Continue CPAP qhs  11. Chronic diarrhea Continue loperamide bid  12. PTSD/Depression/Anxiety Continue Aripiprazole Continue Trazodone Continue Venlafaxine    Disposition: Patient is stable for discharge to SNF pending UA results  Subjective:  Mr. Leighty overall feels better. Patient states that legs and arm are still slightly itchy but much improved. Patient states that combination of lotion and compression are significantly helping with itch and swelling. Patients states that he has lost another 4 lbs due to lasix. Patient reports some chills but denies fever. Patient noted a Right temporal headache beginning this morning but is not very concerned about it. Patient also notes an odor and darkening of urine today but reports no other symptoms of increased frequency, urgency, pain on urination, or fever. Patient reports no need for albuterol at this time and that CPAP machine is helping.   Objective: Temp:  [98.7 F (37.1 C)-98.8 F (37.1 C)] 98.7 F (37.1 C) (07/02 0523) Pulse Rate:  [78-91] 91 (07/02 0523) Resp:  [17-18] 18 (07/02 0523) BP: (138-139)/(72-75) 139/72 (07/02 0523) SpO2:  [93 %-94 %] 93 % (07/02 0523) Weight:  [414 lb 8 oz (188 kg)] 414 lb 8 oz (188 kg) (07/02 0523) Physical Exam: General:  NAD, awake and alert, obese male   Cardiovascular: RRR, 3/6 systolic mumur best heard at left sternal border  Respiratory: wheezing in upper lobes bilaterally, no increased work of breathing Abdomen: obese. Non tender, distended, erythema slightly above previously marked lines  Extremities: ace  wraps applied on legs bilaterally, non pitting edema bilaterally above wraps, erythematous rash bilaterally on all 4 extremities   Laboratory:  Recent Labs Lab 02/02/17 0014 02/04/17 0243 02/05/17 0413  WBC 12.1* 12.4* 13.3*  HGB 12.0* 12.4* 12.7*  HCT 37.3* 37.9* 39.3  PLT 255 277 285    Recent Labs Lab 01/31/17 1048  02/04/17 0243 02/05/17 0413 02/06/17 0601  NA 133*  < > 132* 129* 128*  K 3.9  < > 3.3* 3.7 3.4*  CL 95*  < > 94* 91* 90*  CO2 26  < > 29 28 29   BUN 27*  < > 17 18 19   CREATININE 1.16  < > 0.85 0.86 0.90  CALCIUM 9.0  < > 8.6* 8.7* 8.8*  PROT 6.8  --   --   --   --   BILITOT 0.5  --   --   --   --   ALKPHOS 63  --   --   --   --   ALT 22  --   --   --   --   AST 21  --   --   --   --   GLUCOSE 138*  < > 150* 109* 114*  < > = values in this interval not displayed.    Imaging/Diagnostic Tests: VAS [place] LE Summary:  - No evidence of deep vein thrombosis involving the visualized veins of the right lower extremity. - No evidence of deep vein thrombosis involving the visualized veins of the left lower extremity. - . Incidental findings are consistent with: enlarged lymph node on the right and enlarged lymph node on the left. - No evidence of Baker&'s cyst on the right or left.  ECHO Study Conclusions  - Left ventricle: The cavity size was normal. There was mild concentric hypertrophy. Systolic function was normal. The estimated ejection fraction was in the range of 60% to 65%. Wall motion was normal; there were no regional wall motion abnormalities. Doppler parameters are consistent with abnormal left ventricular relaxation (grade 1 diastolic dysfunction). - Aortic valve: Transvalvular velocity was increased, due to stenosis. There was mild stenosis. There was trivial regurgitation. Valve area (VTI): 1.86 cm^2. Valve area (Vmax): 1.77 cm^2. Valve area (Vmean): 1.82 cm^2. - Mitral valve: Calcified annulus. There was mild  regurgitation. - Left atrium: The atrium was moderately to severely dilated. - Right atrium: The atrium was mildly dilated. - Pericardium, extracardiac: A trivial pericardial effusion was identified   Dg Chest 2 View  Result Date: 01/31/2017 CLINICAL DATA:  Four weeks of shortness of breath which has worsened today. Suspect CHF. EXAM: CHEST  2 VIEW COMPARISON:  None in PACs FINDINGS: The lungs are well-expanded. There is no focal infiltrate. The cardiac silhouette is enlarged. The pulmonary vascularity while mildly prominent centrally do is not clearly cephalized. The mediastinum is normal in width. IMPRESSION: Mild chronic bronchitic-smoking related changes. Cardiomegaly with central pulmonary vascular congestion. No definite pulmonary edema or pneumonia. Electronically Signed   By: David  Martinique M.D.   On: 01/31/2017 13:39   Ct Pelvis Wo Contrast  Result Date: 02/04/2017 CLINICAL DATA:  Initial evaluation for swelling and pelvis in legs with pain in both hips. EXAM: CT PELVIS WITHOUT  CONTRAST TECHNIQUE: Multidetector CT imaging of the pelvis was performed following the standard protocol without intravenous contrast. COMPARISON:  None. FINDINGS: Urinary Tract: Bladder partially distended without acute abnormality. Visualized ureters within normal limits. Bowel: Visualized portions of the bowel are unremarkable without evidence for obstruction or acute inflammation. Appendix visualized within the right lower quadrant and is of normal caliber and appearance associated inflammatory changes to suggest acute appendicitis. Vascular/Lymphatic: Mild scattered atheromatous plaque within the iliac arteries bilaterally, slightly greater on the left. No aneurysm or other vascular abnormality. Mildly prominent external iliac lymph nodes measure up to 16 mm on the left (series 3, image 68), and 14 mm on the right (series 3, image 60). Bilateral inguinal lymph nodes measure up to 2.8 cm on the left (series 3, image  154) and 2.4 cm on the right (series 3, image 151). These are indeterminate, and may be reactive in nature. Reproductive: Prostate within normal limits for size. Dystrophic calcification noted within the central aspect of the prostate. Other: No free air or fluid within the pelvis. Small fat containing bilateral inguinal hernias noted without associated inflammation, slightly larger on the right. Musculoskeletal: Nonspecific mild subcutaneous edema at noted within the lateral aspect of the visualized upper thighs. Small focus of soft tissue emphysema within the subcutaneous fat of the left ventral abdomen likely related to injection site. No acute osseous abnormality. No worrisome lytic or blastic osseous lesions. Mild moderate degenerative osteoarthritic changes present about the hips bilaterally, left slightly worse than right. SI joints approximated and symmetric. IMPRESSION: 1. No acute abnormality identified within the pelvis. 2. Mildly enlarged bilateral external iliac and inguinal lymph nodes as above, nonspecific, but may be reactive in nature. 3. Nonspecific subcutaneous edema within the lateral aspect of the visualized proximal thighs. 4. Mild to moderate degenerative osteoarthrosis about the hips, left slightly worse than right. 5. Mild atherosclerosis. Electronically Signed   By: Jeannine Boga M.D.   On: 02/04/2017 20:28    Caroline More, DO 02/06/2017, 9:39 AM PGY-1, [place] Intern pager: [phone], text pages welcome

## 2017-02-06 NOTE — Progress Notes (Signed)
As per order that states to change wound dressings qshift, this RN changed dressings on BLE: 1.  Removed old dressings. 2.  Applied bactroban ointment. 3.  Placed stockinettes on BLE. 4.  Wrapped with ACE wraps (6in). 5.  Reported to PM nurse to try and change wraps closer to 10-12 hours out, and not at beginning of shift.

## 2017-02-06 NOTE — Progress Notes (Signed)
CSW unable to facilitate pt's d/c this pm, because [name] needs to order pt a CPAP and they could not get it tonight.  Pt unable to use his current machine as it does not work properly, per his wife.  Dayshift CSW to be updated and will f/u in am.  Creta Levin, LCSW Evening/ED Coverage [phone]

## 2017-02-06 NOTE — Clinical Social Work Note (Addendum)
Insurance authorization approved for [employer]. MD aware that 30 day note still needs to be signed for PASARR. She will come sign around 1:30 pm after rounds. SNF is still waiting on bariatric bed to be delivered.  [name], CSW [phone]  1:58 pm 30 day note signed and faxed to [facility] along with H&P and FL2.  [name], CSW [phone]  2:38 pm PASARR obtained: [id number]. Bariatric bed has been delivered to SNF.  [name], [place]  3:27 pm Patient's urine screens came back abnormal. MD is consulting urology. Discharge on hold for today. CSW notified hospital liaison.  [name], [place]

## 2017-02-06 NOTE — Progress Notes (Signed)
   02/06/17 1405  Clinical Encounter Type  Visited With Patient and family together  Visit Type Other (Comment) (Bernie consult)  Spiritual Encounters  Spiritual Needs Brochure  Stress Factors  Patient Stress Factors None identified  Family Stress Factors None identified  Introduction to Pt and spouse. Documentation completed. Notarized. Copy to chart and original to Pt.

## 2017-02-07 LAB — BASIC METABOLIC PANEL
Anion gap: 11 (ref 5–15)
BUN: 21 mg/dL — ABNORMAL HIGH (ref 6–20)
CO2: 30 mmol/L (ref 22–32)
Calcium: 8.9 mg/dL (ref 8.9–10.3)
Chloride: 89 mmol/L — ABNORMAL LOW (ref 101–111)
Creatinine, Ser: 0.95 mg/dL (ref 0.61–1.24)
GFR calc Af Amer: >60 mL/min (ref >60)
GFR calc non Af Amer: >60 mL/min (ref >60)
Glucose, Bld: 133 mg/dL — ABNORMAL HIGH (ref 65–99)
Potassium: 3.4 mmol/L — ABNORMAL LOW (ref 3.5–5.1)
Sodium: 130 mmol/L — ABNORMAL LOW (ref 135–145)

## 2017-02-07 LAB — CBC
HCT: 40 % (ref 39.0–52.0)
Hemoglobin: 13.2 g/dL (ref 13.0–17.0)
MCH: 29 pg (ref 26.0–34.0)
MCHC: 33 g/dL (ref 30.0–36.0)
MCV: 87.9 fL (ref 78.0–100.0)
Platelets: 327 10*3/uL (ref 150–400)
RBC: 4.55 MIL/uL (ref 4.22–5.81)
RDW: 14.7 % (ref 11.5–15.5)
WBC: 14.3 10*3/uL — ABNORMAL HIGH (ref 4.0–10.5)

## 2017-02-07 LAB — GLUCOSE, CAPILLARY: Glucose-Capillary: 132 mg/dL — ABNORMAL HIGH (ref 65–99)

## 2017-02-07 NOTE — Clinical Social Work Note (Signed)
CSW facilitated patient discharge including contacting patient family and facility to confirm patient discharge plans. Clinical information faxed to facility and family agreeable with plan. CSW arranged ambulance transport via [facility] to [name]. RN to call report prior to discharge ([phone]).  CSW will sign off for now as social work intervention is no longer needed. Please consult us again if new needs arise.  Santiana Glidden, CSW [phone]  

## 2017-02-07 NOTE — Clinical Social Work Note (Signed)
[name] now has cpap settings to order machine. They will notify CSW when it has arrived and CSW will then set up transport to facility.  [name], [place]

## 2017-02-07 NOTE — Clinical Social Work Placement (Signed)
   CLINICAL SOCIAL WORK PLACEMENT  NOTE  Date:  02/07/2017  Patient Details  Name: Lawrence [name] MRN: [id number] Date of Birth: June 27, 1956  Clinical Social Work is seeking post-discharge placement for this patient at the [place] level of care (*CSW will initial, date and re-position this form in  chart as items are completed):  Yes   Patient/family provided with [facility] Work Department's list of facilities offering this level of care within the geographic area requested by the patient (or if unable, by the patient's family).  Yes   Patient/family informed of their freedom to choose among providers that offer the needed level of care, that participate in Medicare, Medicaid or managed care program needed by the patient, have an available bed and are willing to accept the patient.  Yes   Patient/family informed of [facility]'s ownership interest in [facility] and [facility], as well as of the fact that they are under no obligation to receive care at these facilities.  PASRR submitted to EDS on 02/02/17     PASRR number received on 02/06/17     Existing PASRR number confirmed on       FL2 transmitted to all facilities in geographic area requested by pt/family on 02/02/17     FL2 transmitted to all facilities within larger geographic area on       Patient informed that his/her managed care company has contracts with or will negotiate with certain facilities, including the following:        Yes   Patient/family informed of bed offers received.  Patient chooses bed at [facility]     Physician recommends and patient chooses bed at      Patient to be transferred to [facility] on 02/07/17.  Patient to be transferred to facility by [facility]     Patient family notified on 02/07/17 of transfer.  Name of family member notified:        PHYSICIAN Please prepare prescriptions     Additional Comment:     _______________________________________________ [name], LCSW 02/07/2017, 1:08 PM

## 2017-02-07 NOTE — Progress Notes (Signed)
Occupational Therapy Treatment Patient Details Name: Sultan Pargas MRN: [id number] DOB: March 15, 1956 Today's Date: 02/07/2017    History of present illness 61 yo male admitted presenting with swelling up to abdomen and cellulitis . PMH is significant for COPD, HTN, morbid obesity, OA (R knee, s/p replacement of L knee), OSA, PTSD, Depression, anxiety.    OT comments  Performed ADL, educated on AE and energy conservation  Follow Up Recommendations  SNF    Equipment Recommendations   (defer to next venue)    Recommendations for Other Services      Precautions / Restrictions Precautions Precautions: Fall Precaution Comments: oxygen sats watch HR Restrictions Weight Bearing Restrictions: No       Mobility Bed Mobility               General bed mobility comments: in recliner at entry  Transfers   Equipment used: Rolling walker (2 wheeled)   Sit to Stand: Supervision              Balance               Standing balance comment: able to release bil hands from walker for peri care and clothing management                           ADL either performed or assessed with clinical judgement   ADL               Lower Body Bathing: Minimal assistance;With adaptive equipment;Sit to/from stand                         General ADL Comments: performed ADL and educated on AE. Pt has a Secondary school teacher at home.  OT did not have wide  sock aide; demonstrated standard.  Pt has difficulty reaching rear peri area.  Educated on toilet aide.  Educated on energy conservation. Pt reports that he breaks activities up and sits whenever he can, including tying top of garbage bag     Vision       Perception     Praxis      Cognition Arousal/Alertness: Awake/alert Behavior During Therapy: WFL for tasks assessed/performed Overall Cognitive Status: Within Functional Limits for tasks assessed                                           Exercises     Shoulder Instructions       General Comments      Pertinent Vitals/ Pain       Pain Score: 0-No pain  Home Living                                          Prior Functioning/Environment              Frequency  Min 2X/week        Progress Toward Goals  OT Goals(current goals can now be found in the care plan section)  Progress towards OT goals: Progressing toward goals  Acute Rehab OT Goals Patient Stated Goal: rehab to get stronger to return home  Plan Discharge plan remains appropriate    Co-evaluation  AM-PAC PT "6 Clicks" Daily Activity     Outcome Measure   Help from another person eating meals?: None Help from another person taking care of personal grooming?: A Little Help from another person toileting, which includes using toliet, bedpan, or urinal?: A Little Help from another person bathing (including washing, rinsing, drying)?: A Little Help from another person to put on and taking off regular upper body clothing?: A Little Help from another person to put on and taking off regular lower body clothing?: A Lot 6 Click Score: 18    End of Session    OT Visit Diagnosis: Unsteadiness on feet (R26.81)   Activity Tolerance Patient tolerated treatment well   Patient Left in chair;with call bell/phone within reach;with family/visitor present   Nurse Communication          Time: 9432-7614 OT Time Calculation (min): 29 min  Charges: OT General Charges $OT Visit: 1 Procedure OT Treatments $Self Care/Home Management : 23-37 mins  Lesle Chris, OTR/L [phone] 02/07/2017   [place] 02/07/2017, 9:31 AM

## 2017-02-09 ENCOUNTER — Encounter: Payer: Self-pay | Admitting: Adult Health

## 2017-02-09 ENCOUNTER — Non-Acute Institutional Stay (SKILLED_NURSING_FACILITY): Payer: BLUE CROSS/BLUE SHIELD | Admitting: Adult Health

## 2017-02-09 DIAGNOSIS — N400 Enlarged prostate without lower urinary tract symptoms: Secondary | ICD-10-CM

## 2017-02-09 DIAGNOSIS — G4733 Obstructive sleep apnea (adult) (pediatric): Secondary | ICD-10-CM | POA: Diagnosis not present

## 2017-02-09 DIAGNOSIS — R600 Localized edema: Secondary | ICD-10-CM

## 2017-02-09 DIAGNOSIS — R319 Hematuria, unspecified: Secondary | ICD-10-CM

## 2017-02-09 DIAGNOSIS — G609 Hereditary and idiopathic neuropathy, unspecified: Secondary | ICD-10-CM

## 2017-02-09 DIAGNOSIS — I10 Essential (primary) hypertension: Secondary | ICD-10-CM | POA: Diagnosis not present

## 2017-02-09 DIAGNOSIS — F4310 Post-traumatic stress disorder, unspecified: Secondary | ICD-10-CM

## 2017-02-09 DIAGNOSIS — L03119 Cellulitis of unspecified part of limb: Secondary | ICD-10-CM | POA: Diagnosis not present

## 2017-02-09 NOTE — Progress Notes (Signed)
Location:   [facility] Room Number: 131 A Place of Service:  SNF (31)   CODE STATUS: Full Code  No Known Allergies  Chief Complaint  Patient presents with  . Hospitalization Follow-up    Hospital Follow up    HPI:  He is a 61 year old who has been hospitalized for cellulitis in his bilateral lower extremities; lower extremity edema to his lower abdomen. He was started on IV lasix and IV abt: he is allergic vancomycin. He has completed his abt. He has been transitioned to po demadex. He developed hematuria and will need to follow up outpatient with urology. He is here for short term rehab with his goal to return back home. He tells me that he is feeling better. There are no nursing concerns at this time.   Past Medical History:  Diagnosis Date  . Anxiety   . Arthritis   . Chronic cough   . Depression   . Hypertension   . PTSD (post-traumatic stress disorder)   . Sleep apnea     Past Surgical History:  Procedure Laterality Date  . HERNIA REPAIR    . JOINT REPLACEMENT     left knee replacement x 2  . KNEE ARTHROSCOPY      Social History   Social History  . Marital status: Single    Spouse name: N/A  . Number of children: N/A  . Years of education: N/A   Occupational History  . Not on file.   Social History Main Topics  . Smoking status: Former Smoker    Quit date: 03/07/2016  . Smokeless tobacco: Never Used  . Alcohol use Yes     Comment: rare  . Drug use: No  . Sexual activity: Not on file   Other Topics Concern  . Not on file   Social History Narrative  . No narrative on file   History reviewed. No pertinent family history.    VITAL SIGNS BP (!) 141/80   Pulse 70   Temp (!) 97.1 F (36.2 C)   Resp 20   Ht 6\' 1"  (1.854 m)   Wt (!) 405 lb (183.7 kg)   SpO2 95%   BMI 53.43 kg/m   Patient's Medications  New Prescriptions   No medications on file  Previous Medications   ARIPIPRAZOLE (ABILIFY) 10 MG TABLET    Take 5 mg by mouth  daily.   CETIRIZINE (ZYRTEC) 10 MG TABLET    Take 10 mg by mouth daily.   GABAPENTIN (NEURONTIN) 300 MG CAPSULE    Take 300-600 mg by mouth 3 (three) times daily. 300 mg twice daily and 600 mg at bedtime   LISINOPRIL-HYDROCHLOROTHIAZIDE (PRINZIDE,ZESTORETIC) 10-12.5 MG TABLET    Take 1 tablet by mouth daily.   LOPERAMIDE (IMODIUM) 2 MG CAPSULE    Take 2 mg by mouth as needed for diarrhea or loose stools.   MUPIROCIN CREAM (BACTROBAN) 2 %    Apply topically 2 (two) times daily.   PRAZOSIN (MINIPRESS) 2 MG CAPSULE    Take 4 mg by mouth at bedtime.   TORSEMIDE (DEMADEX) 20 MG TABLET    Take 1 tablet (20 mg total) by mouth 2 (two) times daily.   TRAZODONE (DESYREL) 100 MG TABLET    Take 200 mg by mouth at bedtime.   VENLAFAXINE XR (EFFEXOR-XR) 150 MG 24 HR CAPSULE    Take 150 mg by mouth daily with breakfast.  Modified Medications   No medications on file  Discontinued Medications  No medications on file     SIGNIFICANT DIAGNOSTIC EXAMS  02-04-17: ct of pelvis: 1. No acute abnormality identified within the pelvis. 2. Mildly enlarged bilateral external iliac and inguinal lymph nodes as above, nonspecific, but may be reactive in nature. 3. Nonspecific subcutaneous edema within the lateral aspect of the visualized proximal thighs. 4. Mild to moderate degenerative osteoarthrosis about the hips, left slightly worse than right. 5. Mild atherosclerosis.  01-31-17: chest x-ray: Mild chronic bronchitic-smoking related changes. Cardiomegaly with central pulmonary vascular congestion. No definite pulmonary edema or pneumonia.   02-02-17: 2-d echo:   - Left ventricle: The cavity size was normal. There was mild concentric hypertrophy. Systolic function was normal. The estimated ejection fraction was in the range of 60% to 65%. Wall motion was normal; there were no regional wall motion abnormalities. Doppler parameters are consistent with abnormal  left ventricular relaxation (grade 1 diastolic  dysfunction). - Mitral valve: Calcified annulus. There was mild regurgitation. - Left atrium: The atrium was moderately to severely dilated. - Right atrium: The atrium was mildly dilated. - Pericardium, extracardiac: A trivial pericardial effusion wasidentified.  02-06-17: renal ultrasound: No acute or focal abnormality   LABS REVIEWED:   01-31-17: wbc 12.7; hgb 13.0; hct 40.4; mcv 98.4; plt 260; glucose 138; bun 27; creat 1.16; k+ 3.9; na++ 133; ca 9.0; liver normal albumin 3.6; tsh 1.689 02-02-17: wbc 12.1; hgb 12.0; hct 37.3; mcv 89.2; plt 255; glucose 125; bun 21; creat 0.83; k+ 4.0; na++ 131; ca 8.2 02-04-17: wbc 12.4; hgb 12.4; hct 37.9; mcv 88.3; plt 277; glucose 19; bun 18; creat 0.86; k+ 3.7; na++ 129; PSA 0.65 02-07-17: wbc 14.3; hgb 13.2; hct 40.0; mcv 87.9; plt 327; glucose 133; bun 21; creat 0.95; k+ 3.4; na++ 130; ca 8.9    Review of Systems  Constitutional: Negative for malaise/fatigue.  Respiratory: Negative for cough and shortness of breath.   Cardiovascular: Negative for chest pain, palpitations and leg swelling.  Gastrointestinal: Negative for abdominal pain, constipation and heartburn.  Genitourinary: Positive for hematuria. Negative for dysuria.  Musculoskeletal: Negative for back pain, joint pain and myalgias.  Skin:       Lower extremities less red and inflamed.   Neurological: Negative for dizziness.  Psychiatric/Behavioral: The patient is not nervous/anxious.     Physical Exam  Constitutional: He is oriented to person, place, and time. He appears well-developed and well-nourished. No distress.  Morbid obesity   Eyes: Conjunctivae are normal.  Neck: Neck supple. No JVD present. No thyromegaly present.  Cardiovascular: Normal rate, regular rhythm and intact distal pulses.   Murmur heard. 3/6  Respiratory: Effort normal and breath sounds normal. No respiratory distress. He has no wheezes.  GI: Soft. Bowel sounds are normal. He exhibits no distension. There is no  tenderness.  Musculoskeletal: He exhibits edema.  Able to move all extremities  2-3+ bilateral lower extremity edema  Lymphadenopathy:    He has no cervical adenopathy.  Neurological: He is alert and oriented to person, place, and time.  Skin: Skin is warm and dry. He is not diaphoretic.  Bilateral lower extremities redness present with gauze and ace wraps  Psychiatric: He has a normal mood and affect.      ASSESSMENT/ PLAN:  1. OSA: does use CPAP nightly   2. PTSD: is stable will continue abilify 5 mg daily effexor XL 150 mg daily and takes trazodone 200 mg nightly   3. Allergic rhinitis: will continue zyrtec 10 mg daily   4. Hypertension: b/p:  141/80: will continue lisinopril 10/12.5 mg daily and minipress 4 mg nightly   5. BPH: PSA 0.65; will continue minipress 4 mg nightly   6. Peripheral neuropathy: no complaints of pain; will continue neurontin 300 mg twice daily and 600 mg nightly   7. Morbid obesity: BMI 53.43: is encouraged to reduce caloric intake  8.  Bilateral lower extremity edema: has EF 60-65% (02-02-17): will continue demadex 20 mg twice daily   9. Bilateral lower extremity cellulitis: has completed abt; will continue to monitor his status  10. Hematuria: no signs of infection present; currently without signs of blood present will monitor    Will check cbc, bmp Will begin daily weights patient with grade I dysfunction Will setup urology consult for his hematuria   Time spent with patient 50    minutes >50% time spent counseling; reviewing medical record; tests; labs; and developing future plan of care    Ok Edwards NP [facility]  Contact [phone] Monday through Friday 8am- 5pm  After hours call [phone]

## 2017-02-10 ENCOUNTER — Encounter: Payer: Self-pay | Admitting: Adult Health

## 2017-02-10 ENCOUNTER — Non-Acute Institutional Stay (SKILLED_NURSING_FACILITY): Payer: BLUE CROSS/BLUE SHIELD | Admitting: Adult Health

## 2017-02-10 DIAGNOSIS — R5381 Other malaise: Secondary | ICD-10-CM

## 2017-02-10 DIAGNOSIS — R600 Localized edema: Secondary | ICD-10-CM

## 2017-02-10 NOTE — Progress Notes (Signed)
Location:   [place] Room Number: 131 A Place of Service:  SNF (31)   CODE STATUS: DNR  Allergies  Allergen Reactions  . Tubersol [Tuberculin] Other (See Comments)    Reaction unknown    Chief Complaint  Patient presents with  . Acute Visit    72 hour Care Plan Meeting    HPI:  We are having his 48 hour admission care plan meeting. We did discuss what his needs are going to be upon his discharge to home. He will need to be able to walk short distances in the home and will need to walk steps in order to get into and out of the house. He is somewhat upset that he has had to come to SNF for rehab. He feels as though he is too young to be here. He is anxious to get home.   Past Medical History:  Diagnosis Date  . Anxiety   . Arthritis   . Chronic cough   . Depression   . Hypertension   . PTSD (post-traumatic stress disorder)   . Sleep apnea     Past Surgical History:  Procedure Laterality Date  . HERNIA REPAIR    . JOINT REPLACEMENT     left knee replacement x 2  . KNEE ARTHROSCOPY      Social History   Social History  . Marital status: Single    Spouse name: N/A  . Number of children: N/A  . Years of education: N/A   Occupational History  . Not on file.   Social History Main Topics  . Smoking status: Former Smoker    Quit date: 03/07/2016  . Smokeless tobacco: Never Used  . Alcohol use Yes     Comment: rare  . Drug use: No  . Sexual activity: Not on file   Other Topics Concern  . Not on file   Social History Narrative  . No narrative on file   History reviewed. No pertinent family history.    VITAL SIGNS BP (!) 141/80   Pulse 70   Temp (!) 97.1 F (36.2 C)   Resp 20   Ht 6\' 1"  (1.854 m)   Wt (!) 405 lb (183.7 kg)   SpO2 95%   BMI 53.43 kg/m   Patient's Medications  New Prescriptions   No medications on file  Previous Medications   ARIPIPRAZOLE (ABILIFY) 10 MG TABLET    Take 5 mg by mouth daily.   CETIRIZINE (ZYRTEC)  10 MG TABLET    Take 10 mg by mouth daily.   GABAPENTIN (NEURONTIN) 300 MG CAPSULE    Take 300-600 mg by mouth 3 (three) times daily. 300 mg twice daily and 600 mg at bedtime   LISINOPRIL-HYDROCHLOROTHIAZIDE (PRINZIDE,ZESTORETIC) 10-12.5 MG TABLET    Take 1 tablet by mouth daily.   LOPERAMIDE (IMODIUM) 2 MG CAPSULE    Take 2 mg by mouth as needed for diarrhea or loose stools.   MUPIROCIN CREAM (BACTROBAN) 2 %    Apply topically 2 (two) times daily.   PRAZOSIN (MINIPRESS) 2 MG CAPSULE    Take 4 mg by mouth at bedtime.   TORSEMIDE (DEMADEX) 20 MG TABLET    Take 1 tablet (20 mg total) by mouth 2 (two) times daily.   TRAZODONE (DESYREL) 100 MG TABLET    Take 200 mg by mouth at bedtime.   VENLAFAXINE XR (EFFEXOR-XR) 150 MG 24 HR CAPSULE    Take 150 mg by mouth daily with breakfast.  Modified  Medications   No medications on file  Discontinued Medications   No medications on file     SIGNIFICANT DIAGNOSTIC EXAMS    02-04-17: ct of pelvis: 1. No acute abnormality identified within the pelvis. 2. Mildly enlarged bilateral external iliac and inguinal lymph nodes as above, nonspecific, but may be reactive in nature. 3. Nonspecific subcutaneous edema within the lateral aspect of the visualized proximal thighs. 4. Mild to moderate degenerative osteoarthrosis about the hips, left slightly worse than right. 5. Mild atherosclerosis.  01-31-17: chest x-ray: Mild chronic bronchitic-smoking related changes. Cardiomegaly with central pulmonary vascular congestion. No definite pulmonary edema or pneumonia.   02-02-17: 2-d echo:   - Left ventricle: The cavity size was normal. There was mild concentric hypertrophy. Systolic function was normal. The estimated ejection fraction was in the range of 60% to 65%. Wall motion was normal; there were no regional wall motion abnormalities. Doppler parameters are consistent with abnormal  left ventricular relaxation (grade 1 diastolic dysfunction). - Mitral valve:  Calcified annulus. There was mild regurgitation. - Left atrium: The atrium was moderately to severely dilated. - Right atrium: The atrium was mildly dilated. - Pericardium, extracardiac: A trivial pericardial effusion wasidentified.  02-06-17: renal ultrasound: No acute or focal abnormality   LABS REVIEWED:   01-31-17: wbc 12.7; hgb 13.0; hct 40.4; mcv 98.4; plt 260; glucose 138; bun 27; creat 1.16; k+ 3.9; na++ 133; ca 9.0; liver normal albumin 3.6; tsh 1.689 02-02-17: wbc 12.1; hgb 12.0; hct 37.3; mcv 89.2; plt 255; glucose 125; bun 21; creat 0.83; k+ 4.0; na++ 131; ca 8.2 02-04-17: wbc 12.4; hgb 12.4; hct 37.9; mcv 88.3; plt 277; glucose 19; bun 18; creat 0.86; k+ 3.7; na++ 129; PSA 0.65 02-07-17: wbc 14.3; hgb 13.2; hct 40.0; mcv 87.9; plt 327; glucose 133; bun 21; creat 0.95; k+ 3.4; na++ 130; ca 8.9    Review of Systems  Constitutional: Negative for malaise/fatigue.  Respiratory: Negative for cough and shortness of breath.   Cardiovascular: Negative for chest pain, palpitations and leg swelling.  Gastrointestinal: Negative for abdominal pain, constipation and heartburn.  Genitourinary: Positive for hematuria. Negative for dysuria.  Musculoskeletal: Negative for back pain, joint pain and myalgias.  Skin:       Lower extremities less red and inflamed.   Neurological: Negative for dizziness.  Psychiatric/Behavioral: The patient is not nervous/anxious.     Physical Exam  Constitutional: He is oriented to person, place, and time. He appears well-developed and well-nourished. No distress.  Morbid obesity   Eyes: Conjunctivae are normal.  Neck: Neck supple. No JVD present. No thyromegaly present.  Cardiovascular: Normal rate, regular rhythm and intact distal pulses.   Murmur heard. 3/6  Respiratory: Effort normal and breath sounds normal. No respiratory distress. He has no wheezes.  GI: Soft. Bowel sounds are normal. He exhibits no distension. There is no tenderness.  Musculoskeletal:  He exhibits edema.  Able to move all extremities  2-3+ bilateral lower extremity edema  Lymphadenopathy:    He has no cervical adenopathy.  Neurological: He is alert and oriented to person, place, and time.  Skin: Skin is warm and dry. He is not diaphoretic.  Bilateral lower extremities redness present with gauze and ace wraps  Psychiatric: He has a normal mood and affect.      ASSESSMENT/ PLAN:  1. Morbid obesity:  2.  Bilateral lower extremity edema:  3. Physical deconditioning  The goals of his therapy is for him to ambulate short distances ; able to climb  steps; to improve strength and self adl care His goal it to return home as soon as he is able.   Will continue to be seen by therapy to achieve these goals   Time spent with patient  40   minutes >50% time spent counseling; reviewing medical record; tests; labs; and developing future plan of care   Ok Edwards NP [facility]  Contact [phone] Monday through Friday 8am- 5pm  After hours call [phone]

## 2017-02-13 ENCOUNTER — Encounter: Payer: Self-pay | Admitting: Internal Medicine

## 2017-02-13 ENCOUNTER — Non-Acute Institutional Stay (SKILLED_NURSING_FACILITY): Payer: BLUE CROSS/BLUE SHIELD | Admitting: Internal Medicine

## 2017-02-13 DIAGNOSIS — L03119 Cellulitis of unspecified part of limb: Secondary | ICD-10-CM

## 2017-02-13 DIAGNOSIS — F4310 Post-traumatic stress disorder, unspecified: Secondary | ICD-10-CM

## 2017-02-13 DIAGNOSIS — I10 Essential (primary) hypertension: Secondary | ICD-10-CM

## 2017-02-13 DIAGNOSIS — R5381 Other malaise: Secondary | ICD-10-CM

## 2017-02-13 DIAGNOSIS — N400 Enlarged prostate without lower urinary tract symptoms: Secondary | ICD-10-CM | POA: Diagnosis not present

## 2017-02-13 DIAGNOSIS — R600 Localized edema: Secondary | ICD-10-CM | POA: Diagnosis not present

## 2017-02-13 DIAGNOSIS — G609 Hereditary and idiopathic neuropathy, unspecified: Secondary | ICD-10-CM

## 2017-02-13 DIAGNOSIS — R319 Hematuria, unspecified: Secondary | ICD-10-CM

## 2017-02-13 LAB — CBC AND DIFFERENTIAL
HCT: 42 (ref 41–53)
Hemoglobin: 13.8 (ref 13.5–17.5)
Platelets: 324 (ref 150–399)
WBC: 12.2

## 2017-02-13 LAB — BASIC METABOLIC PANEL
BUN: 17 (ref 4–21)
Creatinine: 0.9 (ref 0.6–1.3)
Glucose: 139
Potassium: 3.9 (ref 3.4–5.3)
Sodium: 130 — AB (ref 137–147)

## 2017-02-13 LAB — HEPATIC FUNCTION PANEL
ALT: 23 (ref 10–40)
AST: 16 (ref 14–40)
Alkaline Phosphatase: 74 (ref 25–125)
Bilirubin, Total: 0.5

## 2017-02-13 NOTE — Progress Notes (Signed)
Patient ID: Lawrence [name], male   DOB: 17-Apr-1956, 61 y.o.   MRN: [id number]    HISTORY AND PHYSICAL   DATE: 02/13/2017  Location:    [place] Room Number: 518 A Place of Service: SNF (31)   Extended Emergency Contact Information Primary Emergency Contact: [place] Phone: [phone] Relation: Friend  Advanced Directive information Does Patient Have a Medical Advance Directive?: Yes, Would patient like information on creating a medical advance directive?: No - Patient declined, Type of Advance Directive: Out of facility DNR (pink MOST or yellow form), Pre-existing out of facility DNR order (yellow form or pink MOST form): Yellow form placed in chart (order not valid for inpatient use), Does patient want to make changes to medical advance directive?: No - Patient declined  Chief Complaint  Patient presents with  . New Admit To SNF    Admission    HPI:  61 yo male seen today as a new admission into SNF following hospital stay for anasarca, leg edema, LE cellulitis, hematochezia, OSA/CPAP, PTSD, depression/anxiety, HTN, COPD. He presented to the ED with weeping leg wounds and LE edema. He was tx with IV vanco for cellulitis but had a reaction to vanco  (rash under his arms) and medication d/c'd. CT pelvis showed no acute process and no mass. 2D echo showed nml EF, grade 1 DD. He was given IV lasix-->torsemide. CPAP setting at 10cm. Urology saw him for gross hematuria. Venous doppler neg DVT. WBC 12.7K-->14.3K; Na dropped to 128-->130; abs Neutrophils 9.7K; K 3.4; PSA 0.65; Cr 0.95; Hgb dropped to 11.9-->13.2. He presents to SNF for short term rehab.  Today he reports c/a LE cellulitis returning. Wife spresent and states she noticed legs becoming more red and swollen. He gets daily weights. Appetite ok. He is a pt of [facility]. [facility] and he had a cystoscopy in Feb 2018. He is not interested in private urologist. No  other concerns. No f/c. He has itching in between shoulder blades but has not seen a rash. He has itching prior to hospital d/c. He has SOB with exertion but improved since d/c and it resolves within a few minutes of rest.  Hematuria - followed by urology at [facility]. PSA 0.65.  HTN - stable on prazosin, lisinopril HCT  OSA - stable on CPAP @ 10 cm qHS  COPD - past smoker. He takes zyrtec for seasonal allergy and prn benadryl  PTSD/anxiety/depression - mood stable on effexor xr, trazodone, abilify  Peripheral Neuropathy - stable on gabapentin  Chronic back pain/OA hip L>R - MRI L spine in 11/2016 revealed L5-S1 small central disc protrusion; L4-L5, small central disc protrusion without neural contact  Past Medical History:  Diagnosis Date  . Anxiety   . Arthritis   . Chronic cough   . Depression   . Hypertension   . PTSD (post-traumatic stress disorder)   . Sleep apnea     Past Surgical History:  Procedure Laterality Date  . HERNIA REPAIR    . JOINT REPLACEMENT     left knee replacement x 2  . KNEE ARTHROSCOPY      Patient Care Team: [place] as PCP - General (General Practice)  Social History   Social History  . Marital status: Single    Spouse name: N/A  . Number of children: N/A  . Years of education: N/A   Occupational History  . Not on file.   Social History Main  Topics  . Smoking status: Former Smoker    Quit date: 03/07/2016  . Smokeless tobacco: Never Used  . Alcohol use Yes     Comment: rare  . Drug use: No  . Sexual activity: Not on file   Other Topics Concern  . Not on file   Social History Narrative  . No narrative on file     reports that he quit smoking about 11 months ago. He has never used smokeless tobacco. He reports that he drinks alcohol. He reports that he does not use drugs.  History reviewed. No pertinent family history. No family status information on file.     There is no immunization history on file for this  patient.  Allergies  Allergen Reactions  . Tubersol [Tuberculin] Other (See Comments)    Reaction unknown    Medications: Patient's Medications  New Prescriptions   No medications on file  Previous Medications   ARIPIPRAZOLE (ABILIFY) 10 MG TABLET    Take 5 mg by mouth daily.   CETIRIZINE (ZYRTEC) 10 MG TABLET    Take 10 mg by mouth daily.   DIPHENHYDRAMINE (BENADRYL) 25 MG TABLET    Take 25 mg by mouth every 6 (six) hours as needed.   GABAPENTIN (NEURONTIN) 300 MG CAPSULE    Take 300-600 mg by mouth 3 (three) times daily. 300 mg twice daily and 600 mg at bedtime   LISINOPRIL-HYDROCHLOROTHIAZIDE (PRINZIDE,ZESTORETIC) 10-12.5 MG TABLET    Take 1 tablet by mouth daily.   LOPERAMIDE (IMODIUM) 2 MG CAPSULE    Take 2 mg by mouth as needed for diarrhea or loose stools.   MUPIROCIN CREAM (BACTROBAN) 2 %    Apply topically 2 (two) times daily.   PRAZOSIN (MINIPRESS) 2 MG CAPSULE    Take 4 mg by mouth at bedtime.   TORSEMIDE (DEMADEX) 20 MG TABLET    Take 1 tablet (20 mg total) by mouth 2 (two) times daily.   TRAZODONE (DESYREL) 100 MG TABLET    Take 200 mg by mouth at bedtime.   VENLAFAXINE XR (EFFEXOR-XR) 150 MG 24 HR CAPSULE    Take 150 mg by mouth daily with breakfast.  Modified Medications   No medications on file  Discontinued Medications   No medications on file    Review of Systems  Respiratory: Positive for shortness of breath.   Cardiovascular: Positive for leg swelling.  Musculoskeletal: Positive for arthralgias, back pain and gait problem.  Psychiatric/Behavioral: Positive for dysphoric mood and sleep disturbance.  All other systems reviewed and are negative.   Vitals:   02/13/17 1053  BP: (!) 141/80  Pulse: 70  Resp: 20  Temp: (!) 97.1 F (36.2 C)  TempSrc: Oral  SpO2: 95%  Weight: (!) 406 lb (184.2 kg)  Height: 6\' 1"  (1.854 m)   Body mass index is 53.57 kg/m.  Physical Exam  Constitutional: He is oriented to person, place, and time. He appears well-developed  and well-nourished.  Looks uncomfortable sitting up in bed in NAD, no conversational dyspnea. Wife at bedside  HENT:  Mouth/Throat: Oropharynx is clear and moist.  MMM; no oral thrush  Eyes: Pupils are equal, round, and reactive to light. No scleral icterus.  Neck: Neck supple. Carotid bruit is not present.  Cardiovascular: Normal rate, regular rhythm and intact distal pulses.  Exam reveals no gallop and no friction rub.   Murmur (2/6 SEM --> carotid b/l) heard. +1 pitting LE edema b/l. No calf TTP. No palpable cord. LE dsg c/d/i b/l  Pulmonary/Chest: Effort normal. No respiratory distress. He has decreased breath sounds (b/l at base). He has no wheezes. He has no rhonchi. He has no rales. He exhibits no tenderness.  Abdominal: Soft. Normal appearance and bowel sounds are normal. He exhibits no distension, no abdominal bruit, no pulsatile midline mass and no mass. There is no hepatomegaly. There is no tenderness. There is no rigidity, no rebound and no guarding. No hernia.  obese  Musculoskeletal: He exhibits edema and tenderness.  Lymphadenopathy:    He has no cervical adenopathy.  Neurological: He is alert and oriented to person, place, and time.  Skin: Skin is warm and dry. Rash (b/l LE with weeping vesicles; no secondary signs of infection;  streaking b/l leg with TTP and warm to touch) noted.  Psychiatric: He has a normal mood and affect. His behavior is normal. Judgment and thought content normal.     Labs reviewed: Admission on 01/31/2017, Discharged on 02/07/2017  No results displayed because visit has over 200 results.  CBC Latest Ref Rng & Units 02/07/2017 02/05/2017 02/04/2017  WBC 4.0 - 10.5 K/uL 14.3(H) 13.3(H) 12.4(H)  Hemoglobin 13.0 - 17.0 g/dL 13.2 12.7(L) 12.4(L)  Hematocrit 39.0 - 52.0 % 40.0 39.3 37.9(L)  Platelets 150 - 400 K/uL 327 285 277   CMP Latest Ref Rng & Units 02/07/2017 02/06/2017 02/05/2017  Glucose 65 - 99 mg/dL 133(H) 114(H) 109(H)  BUN 6 - 20 mg/dL 21(H) 19 18   Creatinine 0.61 - 1.24 mg/dL 0.95 0.90 0.86  Sodium 135 - 145 mmol/L 130(L) 128(L) 129(L)  Potassium 3.5 - 5.1 mmol/L 3.4(L) 3.4(L) 3.7  Chloride 101 - 111 mmol/L 89(L) 90(L) 91(L)  CO2 22 - 32 mmol/L 30 29 28   Calcium 8.9 - 10.3 mg/dL 8.9 8.8(L) 8.7(L)  Total Protein 6.5 - 8.1 g/dL - - -  Total Bilirubin 0.3 - 1.2 mg/dL - - -  Alkaline Phos 38 - 126 U/L - - -  AST 15 - 41 U/L - - -  ALT 17 - 63 U/L - - -   No results found for: HGBA1C     Dg Chest 2 View  Result Date: 01/31/2017 CLINICAL DATA:  Four weeks of shortness of breath which has worsened today. Suspect CHF. EXAM: CHEST  2 VIEW COMPARISON:  None in PACs FINDINGS: The lungs are well-expanded. There is no focal infiltrate. The cardiac silhouette is enlarged. The pulmonary vascularity while mildly prominent centrally do is not clearly cephalized. The mediastinum is normal in width. IMPRESSION: Mild chronic bronchitic-smoking related changes. Cardiomegaly with central pulmonary vascular congestion. No definite pulmonary edema or pneumonia. Electronically Signed   By: David  Martinique M.D.   On: 01/31/2017 13:39   Ct Pelvis Wo Contrast  Result Date: 02/04/2017 CLINICAL DATA:  Initial evaluation for swelling and pelvis in legs with pain in both hips. EXAM: CT PELVIS WITHOUT CONTRAST TECHNIQUE: Multidetector CT imaging of the pelvis was performed following the standard protocol without intravenous contrast. COMPARISON:  None. FINDINGS: Urinary Tract: Bladder partially distended without acute abnormality. Visualized ureters within normal limits. Bowel: Visualized portions of the bowel are unremarkable without evidence for obstruction or acute inflammation. Appendix visualized within the right lower quadrant and is of normal caliber and appearance associated inflammatory changes to suggest acute appendicitis. Vascular/Lymphatic: Mild scattered atheromatous plaque within the iliac arteries bilaterally, slightly greater on the left. No aneurysm or  other vascular abnormality. Mildly prominent external iliac lymph nodes measure up to 16 mm on the left (series 3, image 68),  and 14 mm on the right (series 3, image 60). Bilateral inguinal lymph nodes measure up to 2.8 cm on the left (series 3, image 154) and 2.4 cm on the right (series 3, image 151). These are indeterminate, and may be reactive in nature. Reproductive: Prostate within normal limits for size. Dystrophic calcification noted within the central aspect of the prostate. Other: No free air or fluid within the pelvis. Small fat containing bilateral inguinal hernias noted without associated inflammation, slightly larger on the right. Musculoskeletal: Nonspecific mild subcutaneous edema at noted within the lateral aspect of the visualized upper thighs. Small focus of soft tissue emphysema within the subcutaneous fat of the left ventral abdomen likely related to injection site. No acute osseous abnormality. No worrisome lytic or blastic osseous lesions. Mild moderate degenerative osteoarthritic changes present about the hips bilaterally, left slightly worse than right. SI joints approximated and symmetric. IMPRESSION: 1. No acute abnormality identified within the pelvis. 2. Mildly enlarged bilateral external iliac and inguinal lymph nodes as above, nonspecific, but may be reactive in nature. 3. Nonspecific subcutaneous edema within the lateral aspect of the visualized proximal thighs. 4. Mild to moderate degenerative osteoarthrosis about the hips, left slightly worse than right. 5. Mild atherosclerosis. Electronically Signed   By: Jeannine Boga M.D.   On: 02/04/2017 20:28   US Renal  Result Date: 02/06/2017 CLINICAL DATA:  Hematuria. EXAM: RENAL / URINARY TRACT ULTRASOUND COMPLETE COMPARISON:  CT 02/04/2017. FINDINGS: Right Kidney: Length: 14.9 cm. Echogenicity within normal limits. No mass or hydronephrosis visualized. Left Kidney: Length: 18.2 cm. Echogenicity within normal limits. No mass or  hydronephrosis visualized. Bladder: Bladder is decompressed. Exam limited due to patient's body habitus. IMPRESSION: No acute or focal abnormality. Electronically Signed   By: Marcello Moores  Register   On: 02/06/2017 17:00     Assessment/Plan   ICD-10-CM   1. Cellulitis of lower extremity, unspecified laterality - exacerbation L03.119   2. Bilateral lower extremity edema R60.0   3. Physical deconditioning R53.81   4. PTSD (post-traumatic stress disorder) F43.10   5. Morbid obesity ([place]) E66.01   6. Essential hypertension, benign I10   7. BPH without obstruction/lower urinary tract symptoms N40.0   8. Hematuria, unspecified type R31.9   9. Idiopathic peripheral neuropathy G60.9      Cont wound care as ordered  Daily weight and record - call provider if gain >2 lbs in 24 hr or 5 lbs in 5 days.  START Doxy 100mg  po BID x 10 days with floraster po BID x 3 weeks  Check CBC w diff, BMP  F/u with specialists as scheduled  GOAL: short term rehab and d/c home when medically appropriate. Communicated with pt and nursing.  Will follow   Keenen Roessner S. Perlie Gold  [facility] and Adult [facility] [address] [place], [place] [address] [phone] Cell (Monday-Friday 8 AM - 5 PM) [phone] After 5 PM and follow prompts

## 2017-02-15 ENCOUNTER — Non-Acute Institutional Stay (SKILLED_NURSING_FACILITY): Payer: BLUE CROSS/BLUE SHIELD | Admitting: Adult Health

## 2017-02-15 DIAGNOSIS — L03119 Cellulitis of unspecified part of limb: Secondary | ICD-10-CM | POA: Diagnosis not present

## 2017-02-15 NOTE — Progress Notes (Signed)
Location:   [facility] Room Number: 144 Place of Service:  SNF (31) CODE STATUS: dnr   Allergies  Allergen Reactions  . Tubersol [Tuberculin] Other (See Comments)    Reaction unknown    Chief Complaint  Patient presents with  . Acute Visit    allergic reaction     HPI:  He has been started on doxycyline by Dr. Eulas Post on 02-13-17. This morning he was found to have a generalized rash and is itching. He has no known history of allergy to doxycycline. He denies any shortness or breath of swelling present. Nursing staff is concerned about an allergic rash.    Past Medical History:  Diagnosis Date  . Anxiety   . Arthritis   . Chronic cough   . Depression   . Hypertension   . PTSD (post-traumatic stress disorder)   . Sleep apnea     Past Surgical History:  Procedure Laterality Date  . HERNIA REPAIR    . JOINT REPLACEMENT     left knee replacement x 2  . KNEE ARTHROSCOPY      Social History   Social History  . Marital status: Single    Spouse name: N/A  . Number of children: N/A  . Years of education: N/A   Occupational History  . Not on file.   Social History Main Topics  . Smoking status: Former Smoker    Quit date: 03/07/2016  . Smokeless tobacco: Never Used  . Alcohol use Yes     Comment: rare  . Drug use: No  . Sexual activity: Not on file   Other Topics Concern  . Not on file   Social History Narrative  . No narrative on file   No family history on file.    VITAL SIGNS BP 140/78   Pulse 80   Temp 98 F (36.7 C)   Resp 18   Ht 6\' 1"  (1.854 m)   Wt (!) 406 lb (184.2 kg)   SpO2 95%   BMI 53.57 kg/m   Patient's Medications  New Prescriptions   No medications on file  Previous Medications   ARIPIPRAZOLE (ABILIFY) 10 MG TABLET    Take 5 mg by mouth daily.   CETIRIZINE (ZYRTEC) 10 MG TABLET    Take 10 mg by mouth daily.   DIPHENHYDRAMINE (BENADRYL) 25 MG TABLET    Take 25 mg by mouth every 6 (six) hours as needed.   DOXYCYCLINE (VIBRAMYCIN) 100 MG CAPSULE    Take 100 mg by mouth 2 (two) times daily.   GABAPENTIN (NEURONTIN) 300 MG CAPSULE    Take 300-600 mg by mouth 3 (three) times daily. 300 mg twice daily and 600 mg at bedtime   LISINOPRIL-HYDROCHLOROTHIAZIDE (PRINZIDE,ZESTORETIC) 10-12.5 MG TABLET    Take 1 tablet by mouth daily.   LOPERAMIDE (IMODIUM) 2 MG CAPSULE    Take 2 mg by mouth as needed for diarrhea or loose stools.   MUPIROCIN CREAM (BACTROBAN) 2 %    Apply topically 2 (two) times daily.   PRAZOSIN (MINIPRESS) 2 MG CAPSULE    Take 4 mg by mouth at bedtime.   TORSEMIDE (DEMADEX) 20 MG TABLET    Take 1 tablet (20 mg total) by mouth 2 (two) times daily.   TRAZODONE (DESYREL) 100 MG TABLET    Take 200 mg by mouth at bedtime.   VENLAFAXINE XR (EFFEXOR-XR) 150 MG 24 HR CAPSULE    Take 150 mg by mouth daily with breakfast.  Modified Medications  No medications on file  Discontinued Medications   No medications on file     SIGNIFICANT DIAGNOSTIC EXAMS   02-04-17: ct of pelvis: 1. No acute abnormality identified within the pelvis. 2. Mildly enlarged bilateral external iliac and inguinal lymph nodes as above, nonspecific, but may be reactive in nature. 3. Nonspecific subcutaneous edema within the lateral aspect of the visualized proximal thighs. 4. Mild to moderate degenerative osteoarthrosis about the hips, left slightly worse than right. 5. Mild atherosclerosis.  01-31-17: chest x-ray: Mild chronic bronchitic-smoking related changes. Cardiomegaly with central pulmonary vascular congestion. No definite pulmonary edema or pneumonia.   02-02-17: 2-d echo:   - Left ventricle: The cavity size was normal. There was mild concentric hypertrophy. Systolic function was normal. The estimated ejection fraction was in the range of 60% to 65%. Wall motion was normal; there were no regional wall motion abnormalities. Doppler parameters are consistent with abnormal  left ventricular relaxation (grade 1  diastolic dysfunction). - Mitral valve: Calcified annulus. There was mild regurgitation. - Left atrium: The atrium was moderately to severely dilated. - Right atrium: The atrium was mildly dilated. - Pericardium, extracardiac: A trivial pericardial effusion wasidentified.  02-06-17: renal ultrasound: No acute or focal abnormality  NO NEW EXAMS   LABS REVIEWED previous:   01-31-17: wbc 12.7; hgb 13.0; hct 40.4; mcv 98.4; plt 260; glucose 138; bun 27; creat 1.16; k+ 3.9; na++ 133; ca 9.0; liver normal albumin 3.6; tsh 1.689 02-02-17: wbc 12.1; hgb 12.0; hct 37.3; mcv 89.2; plt 255; glucose 125; bun 21; creat 0.83; k+ 4.0; na++ 131; ca 8.2 02-04-17: wbc 12.4; hgb 12.4; hct 37.9; mcv 88.3; plt 277; glucose 19; bun 18; creat 0.86; k+ 3.7; na++ 129; PSA 0.65 02-07-17: wbc 14.3; hgb 13.2; hct 40.0; mcv 87.9; plt 327; glucose 133; bun 21; creat 0.95; k+ 3.4; na++ 130; ca 8.9   NO NEW LABS    Review of Systems  Constitutional: Negative for malaise/fatigue.  Respiratory: Negative for cough and shortness of breath.   Cardiovascular: Negative for chest pain, palpitations and leg swelling.  Gastrointestinal: Negative for abdominal pain, constipation and heartburn.  Genitourinary: Positive for hematuria.  Musculoskeletal: Negative for back pain, joint pain and myalgias.  Skin: Positive for rash.       Has generalized rash with itching  Neurological: Negative for dizziness.  Psychiatric/Behavioral: The patient is not nervous/anxious.     Physical Exam  Constitutional: He is oriented to person, place, and time. No distress.  Morbid obesity   Eyes: Conjunctivae are normal.  Neck: Neck supple. No JVD present. No thyromegaly present.  Cardiovascular: Normal rate, regular rhythm and intact distal pulses.   Murmur heard. 3/6  Respiratory: Effort normal and breath sounds normal. No respiratory distress. He has no wheezes.  GI: Soft. Bowel sounds are normal. He exhibits no distension. There is no  tenderness.  Musculoskeletal: He exhibits edema.  Able to move all extremities  2+ bilateral lower extremity edema   Lymphadenopathy:    He has no cervical adenopathy.  Neurological: He is alert and oriented to person, place, and time.  Skin: Skin is warm and dry. He is not diaphoretic.  Has a generalized rash with itching present.  Bilateral lower legs red; no warmth dressings intact   Psychiatric: He has a normal mood and affect.     ASSESSMENT/ PLAN:  1. cellulitis of bilateral legs: will stop the doxycycline due to rash and mark as allergy; will begin cipro 500 mg twice daily through 02-20-17.  Will continue to monitor her status.     Ok Edwards NP [facility]  Contact [phone] Monday through Friday 8am- 5pm  After hours call [phone]

## 2017-02-16 LAB — BASIC METABOLIC PANEL
BUN: 16 (ref 4–21)
Creatinine: 0.9 (ref 0.6–1.3)
Glucose: 102
Potassium: 4 (ref 3.4–5.3)
Sodium: 131 — AB (ref 137–147)

## 2017-02-16 LAB — CBC AND DIFFERENTIAL
HCT: 42 (ref 41–53)
Hemoglobin: 13.7 (ref 13.5–17.5)
Neutrophils Absolute: 7
Platelets: 309 (ref 150–399)
WBC: 10.2

## 2017-02-22 DIAGNOSIS — R5381 Other malaise: Secondary | ICD-10-CM

## 2017-02-22 DIAGNOSIS — R319 Hematuria, unspecified: Secondary | ICD-10-CM | POA: Insufficient documentation

## 2017-02-22 DIAGNOSIS — G629 Polyneuropathy, unspecified: Secondary | ICD-10-CM

## 2017-02-22 DIAGNOSIS — N400 Enlarged prostate without lower urinary tract symptoms: Secondary | ICD-10-CM | POA: Insufficient documentation

## 2017-02-22 DIAGNOSIS — I10 Essential (primary) hypertension: Secondary | ICD-10-CM | POA: Insufficient documentation

## 2017-02-22 HISTORY — DX: Other malaise: R53.81

## 2017-02-22 HISTORY — DX: Benign prostatic hyperplasia without lower urinary tract symptoms: N40.0

## 2017-02-22 HISTORY — DX: Polyneuropathy, unspecified: G62.9

## 2017-02-22 HISTORY — DX: Hematuria, unspecified: R31.9

## 2017-02-27 ENCOUNTER — Non-Acute Institutional Stay (SKILLED_NURSING_FACILITY): Payer: BLUE CROSS/BLUE SHIELD | Admitting: Adult Health

## 2017-02-27 ENCOUNTER — Encounter: Payer: Self-pay | Admitting: Adult Health

## 2017-02-27 DIAGNOSIS — M19012 Primary osteoarthritis, left shoulder: Secondary | ICD-10-CM | POA: Diagnosis not present

## 2017-02-27 NOTE — Progress Notes (Signed)
Location:   [facility] Room Number: 131 A Place of Service:  SNF (31)   CODE STATUS: DNR  Allergies  Allergen Reactions  . Tubersol [Tuberculin] Other (See Comments)    Reaction unknown  . Vancomycin Other (See Comments)    Red Man Syndrome  . Doxycycline Rash    Chief Complaint  Patient presents with  . Acute Visit    Left Shoulder Pain    HPI:  Nursing staff reports that he is having left shoulder pain. He tells me that he has chronic left shoulder pain and that over the past several days his shoulder has is hurting him. He tells me that taking aleve at home helps with his pain. He states that the pain has worsened due to increased work with therapy.   Past Medical History:  Diagnosis Date  . Anxiety   . Arthritis   . Chronic cough   . Depression   . Hypertension   . PTSD (post-traumatic stress disorder)   . Sleep apnea     Past Surgical History:  Procedure Laterality Date  . HERNIA REPAIR    . JOINT REPLACEMENT     left knee replacement x 2  . KNEE ARTHROSCOPY      Social History   Social History  . Marital status: Single    Spouse name: N/A  . Number of children: N/A  . Years of education: N/A   Occupational History  . Not on file.   Social History Main Topics  . Smoking status: Former Smoker    Quit date: 03/07/2016  . Smokeless tobacco: Never Used  . Alcohol use Yes     Comment: rare  . Drug use: No  . Sexual activity: Not on file   Other Topics Concern  . Not on file   Social History Narrative  . No narrative on file   History reviewed. No pertinent family history.    VITAL SIGNS BP 140/80   Pulse 88   Temp 97.6 F (36.4 C)   Resp 20   Ht 6' (1.829 m)   Wt (!) 392 lb 9.6 oz (178.1 kg)   SpO2 98%   BMI 53.25 kg/m   Patient's Medications  New Prescriptions   No medications on file  Previous Medications   ARIPIPRAZOLE (ABILIFY) 5 MG TABLET    Take 5 mg by mouth daily.   CETIRIZINE (ZYRTEC) 10 MG TABLET     Take 10 mg by mouth daily.   DIPHENHYDRAMINE (BENADRYL) 25 MG TABLET    Take 25 mg by mouth every 6 (six) hours as needed.   GABAPENTIN (NEURONTIN) 300 MG CAPSULE    Take 300-600 mg by mouth 3 (three) times daily. 300 mg twice daily and 600 mg at bedtime   LISINOPRIL-HYDROCHLOROTHIAZIDE (PRINZIDE,ZESTORETIC) 10-12.5 MG TABLET    Take 1 tablet by mouth daily.   LOPERAMIDE (IMODIUM) 2 MG CAPSULE    Take 2 mg by mouth as needed for diarrhea or loose stools.   MUPIROCIN CREAM (BACTROBAN) 2 %    Apply topically 2 (two) times daily.   PRAZOSIN (MINIPRESS) 2 MG CAPSULE    Take 4 mg by mouth at bedtime.   PROBIOTIC PRODUCT (PROBIOTIC DAILY) CAPS    Take 1 capsule by mouth 2 (two) times daily. For 3 weeks   TORSEMIDE (DEMADEX) 20 MG TABLET    Take 1 tablet (20 mg total) by mouth 2 (two) times daily.   TRAZODONE (DESYREL) 100 MG TABLET    Take  200 mg by mouth at bedtime.   TRIAMCINOLONE ACETONIDE 0.025 % LOTN    Apply to affected areas topically every day and night shift for rash, picking at skin   VENLAFAXINE XR (EFFEXOR-XR) 150 MG 24 HR CAPSULE    Take 150 mg by mouth daily with breakfast.  Modified Medications   No medications on file  Discontinued Medications   DOXYCYCLINE (VIBRAMYCIN) 100 MG CAPSULE    Take 100 mg by mouth 2 (two) times daily.     SIGNIFICANT DIAGNOSTIC EXAMS  PREVIOUS 02-04-17: ct of pelvis: 1. No acute abnormality identified within the pelvis. 2. Mildly enlarged bilateral external iliac and inguinal lymph nodes as above, nonspecific, but may be reactive in nature. 3. Nonspecific subcutaneous edema within the lateral aspect of the visualized proximal thighs. 4. Mild to moderate degenerative osteoarthrosis about the hips, left slightly worse than right. 5. Mild atherosclerosis.  01-31-17: chest x-ray: Mild chronic bronchitic-smoking related changes. Cardiomegaly with central pulmonary vascular congestion. No definite pulmonary edema or pneumonia.   02-02-17: 2-d echo:   -  Left ventricle: The cavity size was normal. There was mild concentric hypertrophy. Systolic function was normal. The estimated ejection fraction was in the range of 60% to 65%. Wall motion was normal; there were no regional wall motion abnormalities. Doppler parameters are consistent with abnormal  left ventricular relaxation (grade 1 diastolic dysfunction). - Mitral valve: Calcified annulus. There was mild regurgitation. - Left atrium: The atrium was moderately to severely dilated. - Right atrium: The atrium was mildly dilated. - Pericardium, extracardiac: A trivial pericardial effusion wasidentified.  02-06-17: renal ultrasound: No acute or focal abnormality  NO NEW EXAMS   LABS REVIEWED previous:   01-31-17: wbc 12.7; hgb 13.0; hct 40.4; mcv 98.4; plt 260; glucose 138; bun 27; creat 1.16; k+ 3.9; na++ 133; ca 9.0; liver normal albumin 3.6; tsh 1.689 02-02-17: wbc 12.1; hgb 12.0; hct 37.3; mcv 89.2; plt 255; glucose 125; bun 21; creat 0.83; k+ 4.0; na++ 131; ca 8.2 02-04-17: wbc 12.4; hgb 12.4; hct 37.9; mcv 88.3; plt 277; glucose 19; bun 18; creat 0.86; k+ 3.7; na++ 129; PSA 0.65 02-07-17: wbc 14.3; hgb 13.2; hct 40.0; mcv 87.9; plt 327; glucose 133; bun 21; creat 0.95; k+ 3.4; na++ 130; ca 8.9   TODAY 02-13-17: wbc 12.2; hgb 13.8; hct 41.5; mcv 88.2; plt 324; glucose 139; bun 16.9; creat 0.88; k+ 3.9; na++ 130; ca 9.3; liver normal albumin 4.2 02-16-17: wbc 10.2; hgb 13.7; hct 41.5; mcv 88.4; plt 309; glucose  102; bun 15.5; creat 0.89; k+ 4.0; na++ 131; ca 9.2  Review of Systems  Constitutional: Negative for malaise/fatigue.  Respiratory: Negative for cough and shortness of breath.   Cardiovascular: Negative for chest pain, palpitations and leg swelling.  Gastrointestinal: Negative for abdominal pain, constipation and heartburn.  Musculoskeletal: Positive for joint pain. Negative for back pain and myalgias.       Has left shoulder pain   Skin: Negative.   Neurological: Negative for dizziness.   Psychiatric/Behavioral: The patient is not nervous/anxious.       Physical Exam  Constitutional: He is oriented to person, place, and time. No distress.  Morbid obesity  Eyes: Conjunctivae are normal.  Neck: Neck supple. No JVD present. No thyromegaly present.  Cardiovascular: Normal rate, regular rhythm and intact distal pulses.   Murmur heard. 3/6  Respiratory: Effort normal and breath sounds normal. No respiratory distress. He has no wheezes.  GI: Soft. Bowel sounds are normal. He exhibits no distension.  There is no tenderness.  Musculoskeletal: He exhibits edema.  Able to move all extremities 2+ lower extremity edema Has full range of movement  Has tenderness to palpation; does have some crepitus with movement     Lymphadenopathy:    He has no cervical adenopathy.  Neurological: He is alert and oriented to person, place, and time.  Skin: Skin is warm and dry. He is not diaphoretic.  Psychiatric: He has a normal mood and affect.    ASSESSMENT/ PLAN:  1. Osteoarthritis left shoulder: will begin him on aleve 2 tabs twice daily and will continue to monitor his status   MD is aware of resident's narcotic use and is in agreement with current plan of care. We will attempt to wean resident as apropriate   Ok Edwards NP [facility]  Contact [phone] Monday through Friday 8am- 5pm  After hours call [phone]

## 2017-03-05 DIAGNOSIS — M19012 Primary osteoarthritis, left shoulder: Secondary | ICD-10-CM | POA: Insufficient documentation

## 2017-03-05 HISTORY — DX: Primary osteoarthritis, left shoulder: M19.012

## 2017-03-07 ENCOUNTER — Encounter: Payer: Self-pay | Admitting: Adult Health

## 2017-03-07 ENCOUNTER — Non-Acute Institutional Stay (SKILLED_NURSING_FACILITY): Payer: BLUE CROSS/BLUE SHIELD | Admitting: Adult Health

## 2017-03-07 DIAGNOSIS — R5381 Other malaise: Secondary | ICD-10-CM

## 2017-03-07 DIAGNOSIS — G609 Hereditary and idiopathic neuropathy, unspecified: Secondary | ICD-10-CM | POA: Diagnosis not present

## 2017-03-07 DIAGNOSIS — L03119 Cellulitis of unspecified part of limb: Secondary | ICD-10-CM | POA: Diagnosis not present

## 2017-03-07 NOTE — Progress Notes (Signed)
Location:   [facility] Room Number: 131 A Place of Service:  SNF (31)    CODE STATUS: DNR  Allergies  Allergen Reactions  . Tubersol [Tuberculin] Other (See Comments)    Reaction unknown  . Vancomycin Other (See Comments)    Red Man Syndrome  . Doxycycline Rash    Chief Complaint  Patient presents with  . Discharge Note    Discharging to Home - Bilateral lower extremity edema    HPI:  He had been hospitalized for bilateral lower extremity cellulitis. He was admitted to this facility for abt; treatments; and physical therapy. He is being discharged with home health for rn/pt/ot/cna. He will need a standard wheelchair; 3:1 bariatric commode. He will need his prescriptions written and will need to follow up with his medical provider. He tells me that he is ready to go home and has no complaints.     Past Medical History:  Diagnosis Date  . Anxiety   . Arthritis   . Chronic cough   . Depression   . Hypertension   . PTSD (post-traumatic stress disorder)   . Sleep apnea     Past Surgical History:  Procedure Laterality Date  . HERNIA REPAIR    . JOINT REPLACEMENT     left knee replacement x 2  . KNEE ARTHROSCOPY      Social History   Social History  . Marital status: Single    Spouse name: N/A  . Number of children: N/A  . Years of education: N/A   Occupational History  . Not on file.   Social History Main Topics  . Smoking status: Former Smoker    Quit date: 03/07/2016  . Smokeless tobacco: Never Used  . Alcohol use Yes     Comment: rare  . Drug use: No  . Sexual activity: Not on file   Other Topics Concern  . Not on file   Social History Narrative  . No narrative on file   History reviewed. No pertinent family history.  VITAL SIGNS BP 132/68   Pulse 74   Temp 98.7 F (37.1 C)   Resp 18   Ht 6' (1.829 m)   Wt (!) 398 lb 6.4 oz (180.7 kg)   SpO2 98%   BMI 54.03 kg/m   Patient's Medications  New Prescriptions   No  medications on file  Previous Medications   AMINO ACIDS-PROTEIN HYDROLYS (FEEDING SUPPLEMENT, PRO-STAT SUGAR FREE 64,) LIQD    Take 30 mLs by mouth 2 (two) times daily.   ARIPIPRAZOLE (ABILIFY) 5 MG TABLET    Take 5 mg by mouth daily.   CETIRIZINE (ZYRTEC) 10 MG TABLET    Take 10 mg by mouth daily.   DIPHENHYDRAMINE (BENADRYL) 25 MG TABLET    Take 25 mg by mouth every 6 (six) hours as needed.   GABAPENTIN (NEURONTIN) 300 MG CAPSULE    Take 300-600 mg by mouth 3 (three) times daily. 300 mg twice daily and 600 mg at bedtime   LISINOPRIL-HYDROCHLOROTHIAZIDE (PRINZIDE,ZESTORETIC) 10-12.5 MG TABLET    Take 1 tablet by mouth daily.   LOPERAMIDE (IMODIUM) 2 MG CAPSULE    Take 2 mg by mouth as needed for diarrhea or loose stools.   MUPIROCIN CREAM (BACTROBAN) 2 %    Apply topically 2 (two) times daily.   NAPROXEN PO    Take 2 tablets by mouth 2 (two) times daily.   PRAZOSIN (MINIPRESS) 2 MG CAPSULE    Take 4 mg by mouth  at bedtime.   TORSEMIDE (DEMADEX) 20 MG TABLET    Take 1 tablet (20 mg total) by mouth 2 (two) times daily.   TRAZODONE (DESYREL) 100 MG TABLET    Take 200 mg by mouth at bedtime.   TRIAMCINOLONE ACETONIDE 0.025 % LOTN    Apply to affected areas topically every day and night shift for rash, picking at skin   VENLAFAXINE XR (EFFEXOR-XR) 150 MG 24 HR CAPSULE    Take 150 mg by mouth daily with breakfast.  Modified Medications   No medications on file  Discontinued Medications   No medications on file     SIGNIFICANT DIAGNOSTIC EXAMS  PREVIOUS 02-04-17: ct of pelvis: 1. No acute abnormality identified within the pelvis. 2. Mildly enlarged bilateral external iliac and inguinal lymph nodes as above, nonspecific, but may be reactive in nature. 3. Nonspecific subcutaneous edema within the lateral aspect of the visualized proximal thighs. 4. Mild to moderate degenerative osteoarthrosis about the hips, left slightly worse than right. 5. Mild atherosclerosis.  01-31-17: chest x-ray: Mild  chronic bronchitic-smoking related changes. Cardiomegaly with central pulmonary vascular congestion. No definite pulmonary edema or pneumonia.   02-02-17: 2-d echo:   - Left ventricle: The cavity size was normal. There was mild concentric hypertrophy. Systolic function was normal. The estimated ejection fraction was in the range of 60% to 65%. Wall motion was normal; there were no regional wall motion abnormalities. Doppler parameters are consistent with abnormal  left ventricular relaxation (grade 1 diastolic dysfunction). - Mitral valve: Calcified annulus. There was mild regurgitation. - Left atrium: The atrium was moderately to severely dilated. - Right atrium: The atrium was mildly dilated. - Pericardium, extracardiac: A trivial pericardial effusion wasidentified.  02-06-17: renal ultrasound: No acute or focal abnormality  NO NEW EXAMS   LABS REVIEWED previous:   01-31-17: wbc 12.7; hgb 13.0; hct 40.4; mcv 98.4; plt 260; glucose 138; bun 27; creat 1.16; k+ 3.9; na++ 133; ca 9.0; liver normal albumin 3.6; tsh 1.689 02-02-17: wbc 12.1; hgb 12.0; hct 37.3; mcv 89.2; plt 255; glucose 125; bun 21; creat 0.83; k+ 4.0; na++ 131; ca 8.2 02-04-17: wbc 12.4; hgb 12.4; hct 37.9; mcv 88.3; plt 277; glucose 19; bun 18; creat 0.86; k+ 3.7; na++ 129; PSA 0.65 02-07-17: wbc 14.3; hgb 13.2; hct 40.0; mcv 87.9; plt 327; glucose 133; bun 21; creat 0.95; k+ 3.4; na++ 130; ca 8.9  02-13-17: wbc 12.2; hgb 13.8; hct 41.5; mcv 88.2; plt 324; glucose 139; bun 16.9; creat 0.88; k+ 3.9; na++ 130; ca 9.3; liver normal albumin 4.2 02-16-17: wbc 10.2; hgb 13.7; hct 41.5; mcv 88.4; plt 309; glucose  102; bun 15.5; creat 0.89; k+ 4.0; na++ 131; ca 9.2  NO NEW LABS.   Review of Systems  Constitutional: Negative for malaise/fatigue.  Respiratory: Negative for cough and shortness of breath.   Cardiovascular: Negative for chest pain, palpitations and leg swelling.  Gastrointestinal: Negative for abdominal pain, constipation and  heartburn.  Musculoskeletal: Negative for back pain, joint pain and myalgias.  Skin: Negative.   Neurological: Negative for dizziness.  Psychiatric/Behavioral: The patient is not nervous/anxious.     Physical Exam  Constitutional: He is oriented to person, place, and time. No distress.  Eyes: Conjunctivae are normal.  Neck: Neck supple. No JVD present. No thyromegaly present.  Cardiovascular: Normal rate, regular rhythm and intact distal pulses.   Murmur heard. 3/6  Respiratory: Effort normal and breath sounds normal. No respiratory distress. He has no wheezes.  GI: Soft. Bowel  sounds are normal. He exhibits no distension. There is no tenderness.  Musculoskeletal: He exhibits no edema.  Able to move all extremities  1+ lower extremity edema   Lymphadenopathy:    He has no cervical adenopathy.  Neurological: He is alert and oriented to person, place, and time.  Skin: Skin is warm and dry. He is not diaphoretic.  Psychiatric: He has a normal mood and affect.     ASSESSMENT/ PLAN:  Patient is being discharged with the following home health services:  Pt/ot/rn/cna: to evaluate and treat as indicated for gait; balance; strength; adl training; medication management; and adl care.   Patient is being discharged with the following durable medical equipment:  Bariatric 3:1 commode. Standard bariatric wheelchair: with elevated leg rests; anti-tippers; cushion; brake extensions: to allow patient to maintain  His current level of independence with his adl's that cannot be done with a walker; can self propel.   Patient has been advised to f/u with their PCP in 1-2 weeks to bring them up to date on their rehab stay.  Social services at facility was responsible for arranging this appointment.  Pt was provided with a 30 day supply of prescriptions for medications and refills must be obtained from their PCP.  For controlled substances, a more limited supply may be provided adequate until PCP  appointment only.   Time spent with the discharge process: 50 minutes    Ok Edwards NP [facility]  Contact [phone] Monday through Friday 8am- 5pm  After hours call [phone]

## 2017-03-10 DIAGNOSIS — F329 Major depressive disorder, single episode, unspecified: Secondary | ICD-10-CM | POA: Diagnosis not present

## 2017-03-10 DIAGNOSIS — G4733 Obstructive sleep apnea (adult) (pediatric): Secondary | ICD-10-CM | POA: Diagnosis not present

## 2017-03-10 DIAGNOSIS — L03119 Cellulitis of unspecified part of limb: Secondary | ICD-10-CM | POA: Diagnosis not present

## 2017-03-10 DIAGNOSIS — I5042 Chronic combined systolic (congestive) and diastolic (congestive) heart failure: Secondary | ICD-10-CM | POA: Diagnosis not present

## 2017-03-10 DIAGNOSIS — F4312 Post-traumatic stress disorder, chronic: Secondary | ICD-10-CM | POA: Diagnosis not present

## 2017-03-10 DIAGNOSIS — G9009 Other idiopathic peripheral autonomic neuropathy: Secondary | ICD-10-CM | POA: Diagnosis not present

## 2017-03-10 DIAGNOSIS — R339 Retention of urine, unspecified: Secondary | ICD-10-CM | POA: Diagnosis not present

## 2017-03-10 DIAGNOSIS — I110 Hypertensive heart disease with heart failure: Secondary | ICD-10-CM | POA: Diagnosis not present

## 2017-04-28 DIAGNOSIS — Z029 Encounter for administrative examinations, unspecified: Secondary | ICD-10-CM

## 2017-05-30 ENCOUNTER — Other Ambulatory Visit: Payer: Self-pay | Admitting: Pediatrics

## 2017-05-30 DIAGNOSIS — G4730 Sleep apnea, unspecified: Secondary | ICD-10-CM

## 2017-06-02 ENCOUNTER — Encounter (HOSPITAL_COMMUNITY): Payer: Self-pay | Admitting: Emergency Medicine

## 2017-06-02 ENCOUNTER — Emergency Department (HOSPITAL_COMMUNITY)
Admission: EM | Admit: 2017-06-02 | Discharge: 2017-06-02 | Disposition: A | Discharge status: Home / Self Care | Payer: BLUE CROSS/BLUE SHIELD | Attending: Emergency Medicine | Admitting: Emergency Medicine

## 2017-06-02 DIAGNOSIS — Z96652 Presence of left artificial knee joint: Secondary | ICD-10-CM | POA: Insufficient documentation

## 2017-06-02 DIAGNOSIS — I110 Hypertensive heart disease with heart failure: Secondary | ICD-10-CM | POA: Insufficient documentation

## 2017-06-02 DIAGNOSIS — Z79899 Other long term (current) drug therapy: Secondary | ICD-10-CM | POA: Insufficient documentation

## 2017-06-02 DIAGNOSIS — X58XXXA Exposure to other specified factors, initial encounter: Secondary | ICD-10-CM | POA: Insufficient documentation

## 2017-06-02 DIAGNOSIS — I83899 Varicose veins of unspecified lower extremities with other complications: Secondary | ICD-10-CM | POA: Insufficient documentation

## 2017-06-02 DIAGNOSIS — Y929 Unspecified place or not applicable: Secondary | ICD-10-CM | POA: Insufficient documentation

## 2017-06-02 DIAGNOSIS — Y998 Other external cause status: Secondary | ICD-10-CM | POA: Insufficient documentation

## 2017-06-02 DIAGNOSIS — Z5189 Encounter for other specified aftercare: Secondary | ICD-10-CM

## 2017-06-02 DIAGNOSIS — I509 Heart failure, unspecified: Secondary | ICD-10-CM | POA: Insufficient documentation

## 2017-06-02 DIAGNOSIS — Y939 Activity, unspecified: Secondary | ICD-10-CM | POA: Insufficient documentation

## 2017-06-02 DIAGNOSIS — Z87891 Personal history of nicotine dependence: Secondary | ICD-10-CM | POA: Insufficient documentation

## 2017-06-02 NOTE — ED Triage Notes (Signed)
Per EMS, pt from home with c/o bleeding to the left ankle. Pt denies fall/injury, pinpoint sized wound noted by EMS. Bleeding controlled at this time. Hx CHF, ankles normally swollen. VSS, A&O x 4.

## 2017-06-02 NOTE — ED Provider Notes (Signed)
[facility] EMERGENCY DEPARTMENT Provider Note   CSN: [id number] Arrival date & time: 06/02/17  1921 History   Chief Complaint Chief Complaint  Patient presents with  . Wound Check   HPI Lawrence [name] is a 61 y.o. male.  The history is provided by the patient.  Foot Injury   The incident occurred 1 to 2 hours ago. The incident occurred at home. The injury mechanism is unknown. The pain is present in the left foot. The pain is at a severity of 0/10. The patient is experiencing no pain. The pain has been improving since onset. Pertinent negatives include no numbness, no inability to bear weight, no loss of motion, no muscle weakness, no loss of sensation and no tingling. Associated symptoms comments: Bleeding from left foot. He reports no foreign bodies present. Treatments tried: compression dressing. The treatment provided significant relief.   Past Medical History:  Diagnosis Date  . Anxiety   . Arthritis   . CHF (congestive heart failure) ([place])   . Chronic cough   . Depression   . Hypertension   . PTSD (post-traumatic stress disorder)   . Sleep apnea    Patient Active Problem List   Diagnosis Date Noted  . Primary osteoarthritis, left shoulder 03/05/2017  . Essential hypertension, benign 02/22/2017  . BPH without obstruction/lower urinary tract symptoms 02/22/2017  . Hematuria 02/22/2017  . Peripheral neuropathy 02/22/2017  . Physical deconditioning 02/22/2017  . Morbid obesity ([place])   . PTSD (post-traumatic stress disorder)   . Anasarca 01/31/2017  . Bilateral lower extremity edema 01/31/2017  . Cellulitis of lower extremity   . OSA (obstructive sleep apnea)    Past Surgical History:  Procedure Laterality Date  . HERNIA REPAIR    . JOINT REPLACEMENT     left knee replacement x 2  . KNEE ARTHROSCOPY      Home Medications    Prior to Admission medications   Medication Sig Start Date End Date Taking? Authorizing Provider  Amino Acids-Protein  Hydrolys (FEEDING SUPPLEMENT, PRO-STAT SUGAR FREE 64,) LIQD Take 30 mLs by mouth 2 (two) times daily.    [provider]  ARIPiprazole (ABILIFY) 5 MG tablet Take 5 mg by mouth daily.    [provider]  cetirizine (ZYRTEC) 10 MG tablet Take 10 mg by mouth daily.    [provider]  diphenhydrAMINE (BENADRYL) 25 MG tablet Take 25 mg by mouth every 6 (six) hours as needed.    [provider]  gabapentin (NEURONTIN) 300 MG capsule Take 300-600 mg by mouth 3 (three) times daily. 300 mg twice daily and 600 mg at bedtime    [provider]  lisinopril-hydrochlorothiazide (PRINZIDE,ZESTORETIC) 10-12.5 MG tablet Take 1 tablet by mouth daily.    [provider]  loperamide (IMODIUM) 2 MG capsule Take 2 mg by mouth as needed for diarrhea or loose stools.    [provider]  mupirocin cream (BACTROBAN) 2 % Apply topically 2 (two) times daily. 02/06/17   Verner Mould, MD  NAPROXEN PO Take 2 tablets by mouth 2 (two) times daily.    [provider]  prazosin (MINIPRESS) 2 MG capsule Take 4 mg by mouth at bedtime.    [provider]  torsemide (DEMADEX) 20 MG tablet Take 1 tablet (20 mg total) by mouth 2 (two) times daily. 02/06/17   Verner Mould, MD  traZODone (DESYREL) 100 MG tablet Take 200 mg by mouth at bedtime.    [provider]  Triamcinolone Acetonide 0.025 % LOTN Apply to affected areas topically every day and night shift for rash, picking at skin    [provider]  venlafaxine XR (EFFEXOR-XR) 150 MG 24 hr capsule Take 150 mg by mouth daily with breakfast.    [provider]   Family History No family history on file.  Social History Social History  Substance Use Topics  . Smoking status: Former Smoker    Quit date: 03/07/2016  . Smokeless tobacco: Never Used  . Alcohol use Yes     Comment: rare   Allergies   Tubersol [tuberculin]; Vancomycin; and  Doxycycline  Review of Systems Review of Systems  Constitutional: Negative for chills and fever.  HENT: Negative for ear pain and sore throat.   Eyes: Negative for pain and visual disturbance.  Respiratory: Negative for cough and shortness of breath.   Cardiovascular: Negative for chest pain and palpitations.  Gastrointestinal: Negative for abdominal pain and vomiting.  Genitourinary: Negative for dysuria and hematuria.  Musculoskeletal: Negative for arthralgias and back pain.  Skin: Positive for wound. Negative for color change and rash.  Neurological: Negative for tingling, seizures, syncope and numbness.  All other systems reviewed and are negative.  Physical Exam Updated Vital Signs BP 113/61 (BP Location: Right Arm)   Pulse 89   Temp 98 F (36.7 C) (Oral)   Resp 20   SpO2 92%   Physical Exam  Constitutional: He appears well-developed and well-nourished.  HENT:  Head: Normocephalic and atraumatic.  Eyes: Conjunctivae are normal.  Neck: Neck supple.  Cardiovascular: Normal rate and regular rhythm.   No murmur heard. Pulmonary/Chest: Effort normal and breath sounds normal. No respiratory distress.  Abdominal: Soft. There is no tenderness.  Musculoskeletal: He exhibits no edema.  Neurological: He is alert.  Skin: Skin is warm and dry.  Small punctate wound over the left lateral ankle, no active bleeding present  Psychiatric: He has a normal mood and affect.  Nursing note and vitals reviewed.  ED Treatments / Results  Labs (all labs ordered are listed, but only abnormal results are displayed) Labs Reviewed - No data to display  EKG  EKG Interpretation None      Radiology No results found.  Procedures Procedures (including critical care time)  Medications Ordered in ED Medications - No data to display  Initial Impression / Assessment and Plan / ED Course  I have reviewed the triage vital signs and the nursing notes.  Pertinent labs & imaging results  that were available during my care of the patient were reviewed by me and considered in my medical decision making (see chart for details).  Lawrence [name] is a 48-year-old male with past medical history of hypertension, CHF, and venous stasis who presents with concerns for possible varicose vein bleed.  Upon my assessment tiny punctate wound noted to the lateral aspect of the left ankle that is currently hemostatic no active bleeding present.  Dermabond placed over the small puncture wound.  Plan to the patient that bleed was likely due from varicose vein.  In the future to hold pressure for 5-10 minutes.  Bleeding stopped with pressure advised to return to the emergency department for further evaluation.  At this time patient is safe and stable for discharge home.  Patient given strict return precautions.  Advised to follow-up with his PCP in 3-5 days as needed for wound recheck.   Final Clinical Impressions(s) / ED Diagnoses   Final diagnoses:  Visit for wound check  Bleeding from varicose vein    New Prescriptions New Prescriptions   No medications on file     Fenton Foy, MD 06/04/17 [id number]    Blanchie Dessert, MD 06/05/17 [id number]

## 2017-06-20 ENCOUNTER — Ambulatory Visit: Payer: Disability Insurance | Attending: Pediatrics

## 2017-06-20 DIAGNOSIS — I10 Essential (primary) hypertension: Secondary | ICD-10-CM | POA: Insufficient documentation

## 2017-06-20 DIAGNOSIS — M1990 Unspecified osteoarthritis, unspecified site: Secondary | ICD-10-CM | POA: Insufficient documentation

## 2017-06-20 DIAGNOSIS — F329 Major depressive disorder, single episode, unspecified: Secondary | ICD-10-CM | POA: Insufficient documentation

## 2017-06-20 DIAGNOSIS — F419 Anxiety disorder, unspecified: Secondary | ICD-10-CM | POA: Insufficient documentation

## 2017-06-20 DIAGNOSIS — F4310 Post-traumatic stress disorder, unspecified: Secondary | ICD-10-CM | POA: Diagnosis not present

## 2017-06-20 DIAGNOSIS — G4730 Sleep apnea, unspecified: Secondary | ICD-10-CM | POA: Diagnosis present

## 2017-06-20 MED ORDER — ALBUTEROL SULFATE (2.5 MG/3ML) 0.083% IN NEBU
2.5000 mg | INHALATION_SOLUTION | Freq: Once | RESPIRATORY_TRACT | Status: AC
  Administered 2017-06-20: 2.5 mg via RESPIRATORY_TRACT
  Filled 2017-06-20: qty 3

## 2017-11-07 ENCOUNTER — Encounter (HOSPITAL_COMMUNITY): Payer: Self-pay | Admitting: *Deleted

## 2017-11-07 ENCOUNTER — Inpatient Hospital Stay (HOSPITAL_COMMUNITY)
Admit: 2017-11-07 | Discharge: 2017-11-07 | Disposition: A | Discharge status: Home / Self Care | Payer: Non-veteran care | Attending: Internal Medicine | Admitting: Internal Medicine

## 2017-11-07 ENCOUNTER — Inpatient Hospital Stay (HOSPITAL_COMMUNITY)
Admission: EM | Admit: 2017-11-07 | Discharge: 2017-11-09 | DRG: 603 | Disposition: A | Discharge status: Home / Self Care | Payer: Non-veteran care | Attending: Internal Medicine | Admitting: Internal Medicine

## 2017-11-07 ENCOUNTER — Other Ambulatory Visit: Payer: Self-pay

## 2017-11-07 DIAGNOSIS — E119 Type 2 diabetes mellitus without complications: Secondary | ICD-10-CM | POA: Diagnosis present

## 2017-11-07 DIAGNOSIS — I878 Other specified disorders of veins: Secondary | ICD-10-CM | POA: Diagnosis present

## 2017-11-07 DIAGNOSIS — E66813 Obesity, class 3: Secondary | ICD-10-CM | POA: Diagnosis present

## 2017-11-07 DIAGNOSIS — Z79899 Other long term (current) drug therapy: Secondary | ICD-10-CM | POA: Diagnosis not present

## 2017-11-07 DIAGNOSIS — L0390 Cellulitis, unspecified: Secondary | ICD-10-CM

## 2017-11-07 DIAGNOSIS — R609 Edema, unspecified: Secondary | ICD-10-CM | POA: Diagnosis present

## 2017-11-07 DIAGNOSIS — R600 Localized edema: Secondary | ICD-10-CM | POA: Diagnosis present

## 2017-11-07 DIAGNOSIS — L03119 Cellulitis of unspecified part of limb: Secondary | ICD-10-CM | POA: Diagnosis present

## 2017-11-07 DIAGNOSIS — I10 Essential (primary) hypertension: Secondary | ICD-10-CM | POA: Diagnosis present

## 2017-11-07 DIAGNOSIS — Z87891 Personal history of nicotine dependence: Secondary | ICD-10-CM | POA: Diagnosis not present

## 2017-11-07 DIAGNOSIS — F419 Anxiety disorder, unspecified: Secondary | ICD-10-CM | POA: Diagnosis present

## 2017-11-07 DIAGNOSIS — I509 Heart failure, unspecified: Secondary | ICD-10-CM | POA: Diagnosis present

## 2017-11-07 DIAGNOSIS — I110 Hypertensive heart disease with heart failure: Secondary | ICD-10-CM | POA: Diagnosis present

## 2017-11-07 DIAGNOSIS — F329 Major depressive disorder, single episode, unspecified: Secondary | ICD-10-CM | POA: Diagnosis present

## 2017-11-07 DIAGNOSIS — Z6841 Body Mass Index (BMI) 40.0 and over, adult: Secondary | ICD-10-CM

## 2017-11-07 DIAGNOSIS — O2230 Deep phlebothrombosis in pregnancy, unspecified trimester: Secondary | ICD-10-CM

## 2017-11-07 DIAGNOSIS — Z96652 Presence of left artificial knee joint: Secondary | ICD-10-CM | POA: Diagnosis present

## 2017-11-07 DIAGNOSIS — F4310 Post-traumatic stress disorder, unspecified: Secondary | ICD-10-CM | POA: Diagnosis present

## 2017-11-07 DIAGNOSIS — Z7951 Long term (current) use of inhaled steroids: Secondary | ICD-10-CM

## 2017-11-07 DIAGNOSIS — L03116 Cellulitis of left lower limb: Secondary | ICD-10-CM | POA: Diagnosis present

## 2017-11-07 DIAGNOSIS — G4733 Obstructive sleep apnea (adult) (pediatric): Secondary | ICD-10-CM | POA: Diagnosis present

## 2017-11-07 DIAGNOSIS — R5381 Other malaise: Secondary | ICD-10-CM | POA: Diagnosis present

## 2017-11-07 DIAGNOSIS — Z791 Long term (current) use of non-steroidal anti-inflammatories (NSAID): Secondary | ICD-10-CM

## 2017-11-07 DIAGNOSIS — L03115 Cellulitis of right lower limb: Principal | ICD-10-CM | POA: Diagnosis present

## 2017-11-07 HISTORY — DX: Cellulitis, unspecified: L03.90

## 2017-11-07 HISTORY — DX: Type 2 diabetes mellitus without complications: E11.9

## 2017-11-07 LAB — BASIC METABOLIC PANEL
Anion gap: 10 (ref 5–15)
BUN: 30 mg/dL — ABNORMAL HIGH (ref 6–20)
CO2: 32 mmol/L (ref 22–32)
Calcium: 8.9 mg/dL (ref 8.9–10.3)
Chloride: 93 mmol/L — ABNORMAL LOW (ref 101–111)
Creatinine, Ser: 0.88 mg/dL (ref 0.61–1.24)
GFR calc Af Amer: >60 mL/min (ref >60)
GFR calc non Af Amer: >60 mL/min (ref >60)
Glucose, Bld: 161 mg/dL — ABNORMAL HIGH (ref 65–99)
Potassium: 4 mmol/L (ref 3.5–5.1)
Sodium: 135 mmol/L (ref 135–145)

## 2017-11-07 LAB — MRSA PCR SCREENING: MRSA by PCR: NEGATIVE

## 2017-11-07 LAB — SEDIMENTATION RATE: Sed Rate: 26 mm/hr — ABNORMAL HIGH (ref 0–16)

## 2017-11-07 LAB — CBC WITH DIFFERENTIAL/PLATELET
Basophils Absolute: 0 10*3/uL (ref 0.0–0.1)
Basophils Relative: 0 %
Eosinophils Absolute: 0.4 10*3/uL (ref 0.0–0.7)
Eosinophils Relative: 3 %
HCT: 38.5 % — ABNORMAL LOW (ref 39.0–52.0)
Hemoglobin: 12.8 g/dL — ABNORMAL LOW (ref 13.0–17.0)
Lymphocytes Relative: 11 %
Lymphs Abs: 1.3 10*3/uL (ref 0.7–4.0)
MCH: 29.8 pg (ref 26.0–34.0)
MCHC: 33.2 g/dL (ref 30.0–36.0)
MCV: 89.7 fL (ref 78.0–100.0)
Monocytes Absolute: 0.6 10*3/uL (ref 0.1–1.0)
Monocytes Relative: 5 %
Neutro Abs: 9.5 10*3/uL — ABNORMAL HIGH (ref 1.7–7.7)
Neutrophils Relative %: 81 %
Platelets: 279 10*3/uL (ref 150–400)
RBC: 4.29 MIL/uL (ref 4.22–5.81)
RDW: 14.8 % (ref 11.5–15.5)
WBC: 11.8 10*3/uL — ABNORMAL HIGH (ref 4.0–10.5)

## 2017-11-07 LAB — HEMOGLOBIN A1C
Hgb A1c MFr Bld: 8.4 % — ABNORMAL HIGH (ref 4.8–5.6)
Mean Plasma Glucose: 194.38 mg/dL

## 2017-11-07 LAB — C-REACTIVE PROTEIN: CRP: 1.1 mg/dL — ABNORMAL HIGH (ref <1.0)

## 2017-11-07 LAB — PREALBUMIN: Prealbumin: 23.1 mg/dL (ref 18–38)

## 2017-11-07 MED ORDER — SODIUM CHLORIDE 0.9 % IV SOLN
2.0000 g | INTRAVENOUS | Status: DC
  Administered 2017-11-07 – 2017-11-08 (×2): 2 g via INTRAVENOUS
  Filled 2017-11-07 (×2): qty 2
  Filled 2017-11-07: qty 20

## 2017-11-07 MED ORDER — POTASSIUM CHLORIDE CRYS ER 20 MEQ PO TBCR
30.0000 meq | EXTENDED_RELEASE_TABLET | Freq: Two times a day (BID) | ORAL | Status: DC
  Administered 2017-11-07 – 2017-11-09 (×4): 30 meq via ORAL
  Filled 2017-11-07 (×4): qty 1

## 2017-11-07 MED ORDER — GABAPENTIN 300 MG PO CAPS
300.0000 mg | ORAL_CAPSULE | ORAL | Status: DC
  Administered 2017-11-07 – 2017-11-09 (×5): 300 mg via ORAL
  Filled 2017-11-07 (×5): qty 1

## 2017-11-07 MED ORDER — LORAZEPAM 2 MG/ML IJ SOLN
1.0000 mg | Freq: Once | INTRAMUSCULAR | Status: AC
  Administered 2017-11-07: 1 mg via INTRAVENOUS
  Filled 2017-11-07: qty 1

## 2017-11-07 MED ORDER — SODIUM CHLORIDE 0.9 % IV SOLN
INTRAVENOUS | Status: DC
  Administered 2017-11-07: 14:00:00 via INTRAVENOUS

## 2017-11-07 MED ORDER — GABAPENTIN 300 MG PO CAPS: 300.0000 mg | ORAL_CAPSULE | Freq: Three times a day (TID) | ORAL | Status: DC

## 2017-11-07 MED ORDER — METOLAZONE 5 MG PO TABS
5.0000 mg | ORAL_TABLET | Freq: Two times a day (BID) | ORAL | Status: DC
  Administered 2017-11-07 – 2017-11-09 (×4): 5 mg via ORAL
  Filled 2017-11-07 (×4): qty 1

## 2017-11-07 MED ORDER — ORAL CARE MOUTH RINSE
15.0000 mL | Freq: Two times a day (BID) | OROMUCOSAL | Status: DC
  Administered 2017-11-08 – 2017-11-09 (×3): 15 mL via OROMUCOSAL

## 2017-11-07 MED ORDER — CEFAZOLIN SODIUM-DEXTROSE 1-4 GM/50ML-% IV SOLN
1.0000 g | Freq: Once | INTRAVENOUS | Status: AC
  Administered 2017-11-07: 1 g via INTRAVENOUS
  Filled 2017-11-07: qty 50

## 2017-11-07 MED ORDER — LISINOPRIL 10 MG PO TABS
10.0000 mg | ORAL_TABLET | Freq: Every day | ORAL | Status: DC
  Administered 2017-11-08 – 2017-11-09 (×2): 10 mg via ORAL
  Filled 2017-11-07 (×2): qty 1

## 2017-11-07 MED ORDER — FLUTICASONE PROPIONATE 50 MCG/ACT NA SUSP
1.0000 | Freq: Every day | NASAL | Status: DC
  Administered 2017-11-08 – 2017-11-09 (×2): 1 via NASAL
  Filled 2017-11-07: qty 16

## 2017-11-07 MED ORDER — HYDROMORPHONE HCL 1 MG/ML IJ SOLN
0.5000 mg | Freq: Four times a day (QID) | INTRAMUSCULAR | Status: DC | PRN
  Administered 2017-11-07 – 2017-11-09 (×5): 0.5 mg via INTRAVENOUS
  Filled 2017-11-07 (×6): qty 0.5

## 2017-11-07 MED ORDER — ENOXAPARIN SODIUM 40 MG/0.4ML ~~LOC~~ SOLN
40.0000 mg | SUBCUTANEOUS | Status: DC
  Administered 2017-11-07: 40 mg via SUBCUTANEOUS
  Filled 2017-11-07: qty 0.4

## 2017-11-07 MED ORDER — GABAPENTIN 300 MG PO CAPS
600.0000 mg | ORAL_CAPSULE | Freq: Every day | ORAL | Status: DC
Start: 2017-11-07 — End: 2017-11-09
  Administered 2017-11-07 – 2017-11-08 (×2): 600 mg via ORAL
  Filled 2017-11-07 (×2): qty 2

## 2017-11-07 MED ORDER — CHLORHEXIDINE GLUCONATE 0.12 % MT SOLN
15.0000 mL | Freq: Two times a day (BID) | OROMUCOSAL | Status: DC
  Administered 2017-11-07 – 2017-11-09 (×4): 15 mL via OROMUCOSAL
  Filled 2017-11-07 (×4): qty 15

## 2017-11-07 MED ORDER — FUROSEMIDE 10 MG/ML IJ SOLN
80.0000 mg | Freq: Two times a day (BID) | INTRAMUSCULAR | Status: DC
  Administered 2017-11-07 – 2017-11-08 (×3): 80 mg via INTRAVENOUS
  Filled 2017-11-07 (×4): qty 8

## 2017-11-07 MED ORDER — ARIPIPRAZOLE 5 MG PO TABS
5.0000 mg | ORAL_TABLET | Freq: Every day | ORAL | Status: DC
  Administered 2017-11-07 – 2017-11-08 (×2): 5 mg via ORAL
  Filled 2017-11-07 (×3): qty 1

## 2017-11-07 MED ORDER — METRONIDAZOLE IN NACL 5-0.79 MG/ML-% IV SOLN
500.0000 mg | Freq: Three times a day (TID) | INTRAVENOUS | Status: DC
  Administered 2017-11-07 – 2017-11-09 (×6): 500 mg via INTRAVENOUS
  Filled 2017-11-07 (×6): qty 100

## 2017-11-07 NOTE — Progress Notes (Signed)
ABI's have been completed. Right 1.14 Left 1.14  11/07/17 4:48 PM Carlos Levering RVT

## 2017-11-07 NOTE — ED Triage Notes (Signed)
[facility] and edema for the past year, worse for the past month.   BP 155/84 HR 86 O2 91-97 RR 18

## 2017-11-07 NOTE — ED Provider Notes (Signed)
[place] DEPT Provider Note   CSN: [id number] Arrival date & time: 11/07/17  1152     History   Chief Complaint Chief Complaint  Patient presents with  . Leg Swelling    HPI Lawrence [name] is a 62 y.o. male.  Pt presents to the ED today with leg swelling and redness.  The pt said he's had the swelling for the past year, but it's gotten worse in the last month.  He has also noticed redness over the last few weeks.  No oral abx.  He went to the [place] today.  His doctor told him to come to the ED for admission for IV abx.  He has been sob with ambulation.  He has been compliant with his lasix 80 mg bid.  No recent lasix changes.  No fever.     Past Medical History:  Diagnosis Date  . Anxiety   . Arthritis   . CHF (congestive heart failure) ([place])   . Chronic cough   . Depression   . Hypertension   . PTSD (post-traumatic stress disorder)   . Sleep apnea     Patient Active Problem List   Diagnosis Date Noted  . Primary osteoarthritis, left shoulder 03/05/2017  . Essential hypertension, benign 02/22/2017  . BPH without obstruction/lower urinary tract symptoms 02/22/2017  . Hematuria 02/22/2017  . Peripheral neuropathy 02/22/2017  . Physical deconditioning 02/22/2017  . Morbid obesity ([place])   . PTSD (post-traumatic stress disorder)   . Anasarca 01/31/2017  . Bilateral lower extremity edema 01/31/2017  . Cellulitis of lower extremity   . OSA (obstructive sleep apnea)     Past Surgical History:  Procedure Laterality Date  . HERNIA REPAIR    . JOINT REPLACEMENT     left knee replacement x 2  . KNEE ARTHROSCOPY          Home Medications    Prior to Admission medications   Medication Sig Start Date End Date Taking? Authorizing Provider  acetaminophen (TYLENOL) 500 MG tablet Take 500-1,000 mg by mouth 2 (two) times daily as needed for mild pain.   Yes [provider]  ARIPiprazole (ABILIFY) 5 MG tablet Take 5 mg by mouth at  bedtime.    Yes [provider]  cetirizine (ZYRTEC) 10 MG tablet Take 10 mg by mouth daily.   Yes [provider]  fluticasone (FLONASE) 50 MCG/ACT nasal spray Place into both nostrils daily.   Yes [provider]  furosemide (LASIX) 80 MG tablet Take 80 mg by mouth 2 (two) times daily.   Yes [provider]  gabapentin (NEURONTIN) 300 MG capsule Take 300-600 mg by mouth 3 (three) times daily. 300 mg IN THE am, at 3 pm and 600 mg at bedtime   Yes [provider]  lisinopril (PRINIVIL,ZESTRIL) 10 MG tablet Take 10 mg by mouth daily.   Yes [provider]  loperamide (IMODIUM) 2 MG capsule Take 2 mg by mouth as needed for diarrhea or loose stools.   Yes [provider]  metolazone (ZAROXOLYN) 2.5 MG tablet Take 2.5 mg by mouth. Take on Mondays, Wednesdays and Fridays   Yes [provider]  naproxen (NAPROSYN) 375 MG tablet Take 375 mg by mouth 2 (two) times daily.    Yes [provider]  temazepam (RESTORIL) 7.5 MG capsule Take 7.5 mg by mouth at bedtime.   Yes [provider]  TRIAMCINOLONE ACETONIDE,NASAL, NA Place 2 sprays into the nose daily.   Yes  [provider]  Trolamine Salicylate (ASPERCREME EX) Apply 1 application topically 2 (two) times daily as needed (pain and inflammation).   Yes [provider]  mupirocin cream (BACTROBAN) 2 % Apply topically 2 (two) times daily. Patient not taking: Reported on 11/07/2017 02/06/17   Verner Mould, MD  torsemide (DEMADEX) 20 MG tablet Take 1 tablet (20 mg total) by mouth 2 (two) times daily. Patient not taking: Reported on 11/07/2017 02/06/17   Verner Mould, MD    Family History No family history on file.  Social History Social History   Tobacco Use  . Smoking status: Former Smoker    Last attempt to quit: 03/07/2016    Years since quitting: 1.6  . Smokeless tobacco: Never Used  Substance Use Topics  . Alcohol use:  Yes    Comment: rare  . Drug use: No     Allergies   Tubersol [tuberculin]; Vancomycin; and Doxycycline   Review of Systems Review of Systems  Respiratory: Positive for shortness of breath.   Skin:       Bilateral leg redness and swelling  All other systems reviewed and are negative.    Physical Exam Updated Vital Signs BP 132/62   Pulse 87   Resp 16   SpO2 96%   Physical Exam  Constitutional: He is oriented to person, place, and time. He appears well-developed and well-nourished.  HENT:  Head: Normocephalic and atraumatic.  Right Ear: External ear normal.  Left Ear: External ear normal.  Nose: Nose normal.  Mouth/Throat: Oropharynx is clear and moist.  Eyes: Pupils are equal, round, and reactive to light. Conjunctivae and EOM are normal.  Neck: Normal range of motion. Neck supple.  Cardiovascular: Normal rate, regular rhythm, normal heart sounds and intact distal pulses.  Pulmonary/Chest: Effort normal and breath sounds normal.  Abdominal: Soft. Bowel sounds are normal.  Neurological: He is alert and oriented to person, place, and time.  Skin: Capillary refill takes less than 2 seconds.  Bilateral cellulitis with weeping edema.  See picture below.  Psychiatric: He has a normal mood and affect. His behavior is normal. Judgment and thought content normal.  Nursing note and vitals reviewed.    Right leg above Left leg below     ED Treatments / Results  Labs (all labs ordered are listed, but only abnormal results are displayed) Labs Reviewed  BASIC METABOLIC PANEL - Abnormal; Notable for the following components:      Result Value   Chloride 93 (*)    Glucose, Bld 161 (*)    BUN 30 (*)    All other components within normal limits  CBC WITH DIFFERENTIAL/PLATELET - Abnormal; Notable for the following components:   WBC 11.8 (*)    Hemoglobin 12.8 (*)    HCT 38.5 (*)    Neutro Abs 9.5 (*)    All other components within normal limits  CULTURE, BLOOD  (ROUTINE X 2)    EKG None  Radiology No results found.  Procedures Procedures (including critical care time)  Medications Ordered in ED Medications  0.9 %  sodium chloride infusion ( Intravenous New Bag/Given 11/07/17 1343)  ceFAZolin (ANCEF) IVPB 1 g/50 mL premix (1 g Intravenous New Bag/Given 11/07/17 1343)  LORazepam (ATIVAN) injection 1 mg (1 mg Intravenous Given 11/07/17 1344)     Initial Impression / Assessment and Plan / ED Course  I have reviewed the triage vital signs and the nursing notes.  Pertinent labs & imaging results that were available  during my care of the patient were reviewed by me and considered in my medical decision making (see chart for details).    Pt given IV ancef for his cellulitis.  He is allergic to vancomycin.  Pt d/w Dr. Rodena Piety ([facility]) for admission.  Final Clinical Impressions(s) / ED Diagnoses   Final diagnoses:  Peripheral edema  Cellulitis of right lower extremity  Cellulitis of left lower extremity    ED Discharge Orders    None       Isla Pence, MD 11/07/17 1410

## 2017-11-07 NOTE — ED Notes (Signed)
Bed: WA07 Expected date:  Expected time:  Means of arrival:  Comments: ems 

## 2017-11-07 NOTE — H&P (Signed)
History and Physical    Fernandez Kenley HYW:[id number] DOB: [birth year] DOA: 11/07/2017  PCP: [facility] Patient coming from: Home  Chief Complaint: Swelling and redness in both legs.  HPI: Lawrence [name] is a 62 y.o. male with medical history significant of congestive heart failure, hypertension, depression, sleep apnea, admitted with bilateral lower extremity swelling and redness.  He reports his swelling gets gotten worse in the last 4 weeks.  He went to [facility] today and his doctor told him to come to the emergency room for admission for IV antibiotics.  He takes Lasix twice a day 80 mg.  Along with Zaroxolyn.  He does complain of shortness of breath and dyspnea on exertion.  Has a history of sleep apnea and uses CPAP at home.  He denies any fever or chills.  No nausea vomiting diarrhea urinary complaints abdominal pain.  He does have a history of recurrent cellulitis.  His fianc tells me that he does have Ace wraps and stockings at home which is very hard to put on him.   ED Course: In the ER he received Ancef.  His vital signs were 09/02/1971 pulse 86 respirations 16 saturation 100% on room air his temperature is not recorded yet.  Review of Systems: As per HPI otherwise all other systems reviewed and are negative  Past Medical History:  Diagnosis Date  . Anxiety   . Arthritis   . CHF (congestive heart failure) ([place])   . Chronic cough   . Depression   . Hypertension   . PTSD (post-traumatic stress disorder)   . Sleep apnea     Past Surgical History:  Procedure Laterality Date  . HERNIA REPAIR    . JOINT REPLACEMENT     left knee replacement x 2  . KNEE ARTHROSCOPY      Social History   Socioeconomic History  . Marital status: Single    Spouse name: Not on file  . Number of children: Not on file  . Years of education: Not on file  . Highest education level: Not on file  Occupational History  . Not on file  Social Needs  . Financial resource strain: Not on  file  . Food insecurity:    Worry: Not on file    Inability: Not on file  . Transportation needs:    Medical: Not on file    Non-medical: Not on file  Tobacco Use  . Smoking status: Former Smoker    Last attempt to quit: 03/07/2016    Years since quitting: 1.6  . Smokeless tobacco: Never Used  Substance and Sexual Activity  . Alcohol use: Yes    Comment: rare  . Drug use: No  . Sexual activity: Not on file  Lifestyle  . Physical activity:    Days per week: Not on file    Minutes per session: Not on file  . Stress: Not on file  Relationships  . Social connections:    Talks on phone: Not on file    Gets together: Not on file    Attends religious service: Not on file    Active member of club or organization: Not on file    Attends meetings of clubs or organizations: Not on file    Relationship status: Not on file  . Intimate partner violence:    Fear of current or ex partner: Not on file    Emotionally abused: Not on file    Physically abused: Not on file    Forced  sexual activity: Not on file  Other Topics Concern  . Not on file  Social History Narrative  . Not on file    Allergies  Allergen Reactions  . Tubersol [Tuberculin] Other (See Comments)    Reaction unknown  . Vancomycin Other (See Comments)    Red Man Syndrome  . Doxycycline Rash    No family history on file.   Prior to Admission medications   Medication Sig Start Date End Date Taking? Authorizing Provider  acetaminophen (TYLENOL) 500 MG tablet Take 500-1,000 mg by mouth 2 (two) times daily as needed for mild pain.   Yes [provider]  ARIPiprazole (ABILIFY) 5 MG tablet Take 5 mg by mouth at bedtime.    Yes [provider]  cetirizine (ZYRTEC) 10 MG tablet Take 10 mg by mouth daily.   Yes [provider]  fluticasone (FLONASE) 50 MCG/ACT nasal spray Place into both nostrils daily.   Yes [provider]  furosemide (LASIX) 80 MG tablet Take 80 mg by mouth 2 (two)  times daily.   Yes [provider]  gabapentin (NEURONTIN) 300 MG capsule Take 300-600 mg by mouth 3 (three) times daily. 300 mg IN THE am, at 3 pm and 600 mg at bedtime   Yes [provider]  lisinopril (PRINIVIL,ZESTRIL) 10 MG tablet Take 10 mg by mouth daily.   Yes [provider]  loperamide (IMODIUM) 2 MG capsule Take 2 mg by mouth as needed for diarrhea or loose stools.   Yes [provider]  metolazone (ZAROXOLYN) 2.5 MG tablet Take 2.5 mg by mouth. Take on Mondays, Wednesdays and Fridays   Yes [provider]  naproxen (NAPROSYN) 375 MG tablet Take 375 mg by mouth 2 (two) times daily.    Yes [provider]  temazepam (RESTORIL) 7.5 MG capsule Take 7.5 mg by mouth at bedtime.   Yes [provider]  TRIAMCINOLONE ACETONIDE,NASAL, NA Place 2 sprays into the nose daily.   Yes [provider]  Trolamine Salicylate (ASPERCREME EX) Apply 1 application topically 2 (two) times daily as needed (pain and inflammation).   Yes [provider]  mupirocin cream (BACTROBAN) 2 % Apply topically 2 (two) times daily. Patient not taking: Reported on 11/07/2017 02/06/17   Verner Mould, MD  torsemide (DEMADEX) 20 MG tablet Take 1 tablet (20 mg total) by mouth 2 (two) times daily. Patient not taking: Reported on 11/07/2017 02/06/17   Verner Mould, MD    Physical Exam: Vitals:   11/07/17 1201 11/07/17 1400 11/07/17 1430 11/07/17 1500  BP: 130/74 132/62 (!) 131/51 126/74  Pulse: 87 87 85 86  Resp: 15 16  16   SpO2: 96% 96% 96% 100%     General:  Appears calm and comfortable Eyes:  PERRL, EOMI, normal lids, iris ENT:  grossly normal hearing, lips & tongue, mmm Neck:  no LAD, masses or thyromegaly Cardiovascular: RRR, no m/r/g. No LE edema.  Respiratory:  CTA bilaterally, no w/r/r. Normal respiratory effort. Abdomen: soft, ntnd, NABS Skin:  no rash or induration seen on limited exam Musculoskeletal: 3+  pitting edema of both lower extremities, ulcer anterior shin of the right lower extremity faint and decreased pulses to the lower extremity bilaterally. Psychiatric: grossly normal mood and affect, speech fluent and appropriate, AOx3 Neurologic:  CN 2-12 grossly intact, moves all extremities in coordinated fashion, sensation intact  Labs on Admission: I have personally reviewed following labs and imaging studies  CBC: Recent Labs  Lab 11/07/17  1252  WBC 11.8*  NEUTROABS 9.5*  HGB 12.8*  HCT 38.5*  MCV 89.7  PLT 093   Basic Metabolic Panel: Recent Labs  Lab 11/07/17 1252  NA 135  K 4.0  CL 93*  CO2 32  GLUCOSE 161*  BUN 30*  CREATININE 0.88  CALCIUM 8.9   GFR: CrCl cannot be calculated (Unknown ideal weight.). Liver Function Tests: No results for input(s): AST, ALT, ALKPHOS, BILITOT, PROT, ALBUMIN in the last 168 hours. No results for input(s): LIPASE, AMYLASE in the last 168 hours. No results for input(s): AMMONIA in the last 168 hours. Coagulation Profile: No results for input(s): INR, PROTIME in the last 168 hours. Cardiac Enzymes: No results for input(s): CKTOTAL, CKMB, CKMBINDEX, TROPONINI in the last 168 hours. BNP (last 3 results) No results for input(s): PROBNP in the last 8760 hours. HbA1C: No results for input(s): HGBA1C in the last 72 hours. CBG: No results for input(s): GLUCAP in the last 168 hours. Lipid Profile: No results for input(s): CHOL, HDL, LDLCALC, TRIG, CHOLHDL, LDLDIRECT in the last 72 hours. Thyroid Function Tests: No results for input(s): TSH, T4TOTAL, FREET4, T3FREE, THYROIDAB in the last 72 hours. Anemia Panel: No results for input(s): VITAMINB12, FOLATE, FERRITIN, TIBC, IRON, RETICCTPCT in the last 72 hours. Urine analysis:    Component Value Date/Time   COLORURINE RED (A) 02/06/2017 1215   APPEARANCEUR CLOUDY (A) 02/06/2017 1215   LABSPEC  02/06/2017 1215    TEST NOT REPORTED DUE TO COLOR INTERFERENCE OF URINE PIGMENT   PHURINE   02/06/2017 1215    TEST NOT REPORTED DUE TO COLOR INTERFERENCE OF URINE PIGMENT   GLUCOSEU (A) 02/06/2017 1215    TEST NOT REPORTED DUE TO COLOR INTERFERENCE OF URINE PIGMENT   HGBUR (A) 02/06/2017 1215    TEST NOT REPORTED DUE TO COLOR INTERFERENCE OF URINE PIGMENT   BILIRUBINUR (A) 02/06/2017 1215    TEST NOT REPORTED DUE TO COLOR INTERFERENCE OF URINE PIGMENT   KETONESUR (A) 02/06/2017 1215    TEST NOT REPORTED DUE TO COLOR INTERFERENCE OF URINE PIGMENT   PROTEINUR (A) 02/06/2017 1215    TEST NOT REPORTED DUE TO COLOR INTERFERENCE OF URINE PIGMENT   NITRITE (A) 02/06/2017 1215    TEST NOT REPORTED DUE TO COLOR INTERFERENCE OF URINE PIGMENT   LEUKOCYTESUR (A) 02/06/2017 1215    TEST NOT REPORTED DUE TO COLOR INTERFERENCE OF URINE PIGMENT    Creatinine Clearance: CrCl cannot be calculated (Unknown ideal weight.).  Sepsis Labs: @LABRCNTIP (procalcitonin:4,lacticidven:4) )No results found for this or any previous visit (from the past 240 hour(s)).   Radiological Exams on Admission: No results found.  EKG: Independently reviewed.   Assessment/Plan Active Problems:   Cellulitis   1] bilateral lower extremity cellulitis right worse than left-patient presented with complaints of increased bilateral lower extremity swelling and erythema.  Denies any fever at home.  Patient does have history of venous stasis.  And he has chronic lower extremity edema which has been getting worse.  He reports he has been taking Lasix at home as ordered.  He normally takes Lasix 80 mg twice a day at home I will give him 80 mg IV twice a day along with Zaroxolyn 5 mg Monday Wednesday and Friday.  He takes Zaroxolyn 2.5 mg at home.  I will also add potassium replacement.  He does not take potassium replacement at home.  Follow-up renal functions and electrolytes tomorrow.  I will also add hemoglobin A1c sed rate CRP to his labs.  Check vascular ultrasound of both lower extremities and ABI of both lower  extremities.  Consult wound care team.  He does have an small open area in the anterior shin which has been there over 2 months.  Order MRSA PCR patient does not have a history of diabetes.  He will be placed on Rocephin Flagyl and vancomycin will order pharmacy to follow the levels and adjust the dose of vancomycin.  Narrow antibiotics as needed.  2] morbid obesity with obstructive sleep apnea on CPAP at home.  3] congestive heart failure echocardiogram in 01/2017 ejection fraction 60-65%.   DVT prophylaxis: Lovenox Code Status: Full code Family Communication: Discussed with the fianc Disposition Plan: TBD Consults called: Wound care team inpatient Admission status:    Georgette Shell MD [facility]  If 7PM-7AM, please contact night-coverage [email or web address] Password [facility]  11/07/2017, 3:44 PM

## 2017-11-07 NOTE — ED Notes (Signed)
Vascular at bedside. Patient to be transported after.

## 2017-11-08 ENCOUNTER — Encounter (HOSPITAL_COMMUNITY): Payer: Disability Insurance

## 2017-11-08 ENCOUNTER — Inpatient Hospital Stay (HOSPITAL_COMMUNITY): Payer: Non-veteran care

## 2017-11-08 ENCOUNTER — Encounter (HOSPITAL_COMMUNITY): Payer: Self-pay | Admitting: Internal Medicine

## 2017-11-08 DIAGNOSIS — G4733 Obstructive sleep apnea (adult) (pediatric): Secondary | ICD-10-CM

## 2017-11-08 DIAGNOSIS — E119 Type 2 diabetes mellitus without complications: Secondary | ICD-10-CM

## 2017-11-08 DIAGNOSIS — I5030 Unspecified diastolic (congestive) heart failure: Secondary | ICD-10-CM

## 2017-11-08 DIAGNOSIS — R609 Edema, unspecified: Secondary | ICD-10-CM

## 2017-11-08 DIAGNOSIS — L03119 Cellulitis of unspecified part of limb: Secondary | ICD-10-CM

## 2017-11-08 DIAGNOSIS — R600 Localized edema: Secondary | ICD-10-CM

## 2017-11-08 DIAGNOSIS — I10 Essential (primary) hypertension: Secondary | ICD-10-CM

## 2017-11-08 DIAGNOSIS — L0390 Cellulitis, unspecified: Secondary | ICD-10-CM

## 2017-11-08 HISTORY — DX: Type 2 diabetes mellitus without complications: E11.9

## 2017-11-08 LAB — CBC
HCT: 40.7 % (ref 39.0–52.0)
Hemoglobin: 12.9 g/dL — ABNORMAL LOW (ref 13.0–17.0)
MCH: 29.1 pg (ref 26.0–34.0)
MCHC: 31.7 g/dL (ref 30.0–36.0)
MCV: 91.9 fL (ref 78.0–100.0)
Platelets: 298 10*3/uL (ref 150–400)
RBC: 4.43 MIL/uL (ref 4.22–5.81)
RDW: 15.1 % (ref 11.5–15.5)
WBC: 11 10*3/uL — ABNORMAL HIGH (ref 4.0–10.5)

## 2017-11-08 LAB — BASIC METABOLIC PANEL
Anion gap: 10 (ref 5–15)
BUN: 23 mg/dL — ABNORMAL HIGH (ref 6–20)
CO2: 33 mmol/L — ABNORMAL HIGH (ref 22–32)
Calcium: 9 mg/dL (ref 8.9–10.3)
Chloride: 92 mmol/L — ABNORMAL LOW (ref 101–111)
Creatinine, Ser: 0.92 mg/dL (ref 0.61–1.24)
GFR calc Af Amer: >60 mL/min (ref >60)
GFR calc non Af Amer: >60 mL/min (ref >60)
Glucose, Bld: 155 mg/dL — ABNORMAL HIGH (ref 65–99)
Potassium: 3.9 mmol/L (ref 3.5–5.1)
Sodium: 135 mmol/L (ref 135–145)

## 2017-11-08 LAB — TROPONIN I
Troponin I: <0.03 ng/mL (ref <0.03)
Troponin I: <0.03 ng/mL (ref <0.03)
Troponin I: <0.03 ng/mL (ref <0.03)

## 2017-11-08 LAB — HIV ANTIBODY (ROUTINE TESTING W REFLEX): HIV Screen 4th Generation wRfx: NONREACTIVE

## 2017-11-08 LAB — GLUCOSE, CAPILLARY: Glucose-Capillary: 180 mg/dL — ABNORMAL HIGH (ref 65–99)

## 2017-11-08 LAB — BRAIN NATRIURETIC PEPTIDE: B Natriuretic Peptide: 28.6 pg/mL (ref 0.0–100.0)

## 2017-11-08 LAB — TSH: TSH: 2.246 u[IU]/mL (ref 0.350–4.500)

## 2017-11-08 MED ORDER — INSULIN ASPART 100 UNIT/ML ~~LOC~~ SOLN
0.0000 [IU] | Freq: Three times a day (TID) | SUBCUTANEOUS | Status: DC
  Administered 2017-11-09: 3 [IU] via SUBCUTANEOUS
  Administered 2017-11-09: 2 [IU] via SUBCUTANEOUS

## 2017-11-08 MED ORDER — LIVING WELL WITH DIABETES BOOK
Freq: Once | Status: AC
  Administered 2017-11-08: 22:00:00
  Filled 2017-11-08: qty 1

## 2017-11-08 MED ORDER — JUVEN PO PACK
1.0000 | PACK | Freq: Two times a day (BID) | ORAL | Status: DC
  Administered 2017-11-08 – 2017-11-09 (×2): 1 via ORAL
  Filled 2017-11-08 (×3): qty 1

## 2017-11-08 MED ORDER — ENOXAPARIN SODIUM 100 MG/ML ~~LOC~~ SOLN
95.0000 mg | SUBCUTANEOUS | Status: DC
  Administered 2017-11-08: 95 mg via SUBCUTANEOUS
  Filled 2017-11-08: qty 1

## 2017-11-08 MED ORDER — FUROSEMIDE 40 MG PO TABS
80.0000 mg | ORAL_TABLET | Freq: Two times a day (BID) | ORAL | Status: DC
  Administered 2017-11-09: 80 mg via ORAL
  Filled 2017-11-08: qty 2

## 2017-11-08 NOTE — Consult Note (Signed)
[place] Nurse wound consult note Reason for Consult: bilateral lower leg cellulitis Wound type: venous stasis, chronic Measurement: Right shin has an area measuring 2 cm x 2.2 cm x 0.1 cm 100% pink, no odor, no drainage.  Left shin has an area 0.7 cm x 0.5 cm x no appreciable depth.  No drainage, no odor, also 100% pink. Dressing procedure/placement/frequency: Apply foam dressings to any weeping areas on bilateral lower legs.  Beginning just behind the toes, spiral wrap kerlex to just below the knee, then top with an ACE wrap in the same manner.  Try to obtain compression hose from home and help patient apply them.  If successful in doing this, discontinue the kerlex and ace wrap order. Thank you for the consult.  Discussed plan of care with the patient and bedside nurse.  [place] nurse will not follow at this time.  Please re-consult the [place] team if needed.  Val Riles, RN, MSN, CWOCN, CNS-BC, pager [phone]

## 2017-11-08 NOTE — Progress Notes (Signed)
Initial Nutrition Assessment  DOCUMENTATION CODES:   Morbid obesity  INTERVENTION:   Juven Fruit Punch BID, each serving provides 80kcal and 14g of protein (amino acids glutamine and arginine)  NUTRITION DIAGNOSIS:   Increased nutrient needs related to wound healing as evidenced by estimated needs.  GOAL:   Patient will meet greater than or equal to 90% of their needs  MONITOR:   PO intake, Supplement acceptance, Weight trends, Labs  REASON FOR ASSESSMENT:   Consult Wound healing  ASSESSMENT:   Patient with PMH significant for congestive heart failure, hypertension, depression, sleep apnea, admitted with bilateral lower extremity swelling and redness.    Spoke with pt at bedside. Denies having any loss in appetite PTA. Endorses trying to lose weight by changing dietary habits. Pt typically eats three meals with snacks each day. Meals consist of chicken salads, leaner meats with vegetables, and whole grain options. Pt has had previous weight loss education before with a RD from a program in [place]. Discussed the importance of protein intake for wound healing. Pt consuming adequate amount of protein through his diet. RD to add Juven to support wound healing.   Pt endorses a UBW of 428 lb, the last time being at that weight 2 months ago. Records indicate pt's weight fluctuates with each admission due to excessive fluid accumulation. Nutrition-Focused physical exam completed.  Medications reviewed and include: 80 mg lasix BID, 30 mEq KCl, IV abx Labs reviewed.   NUTRITION - FOCUSED PHYSICAL EXAM:    Most Recent Value  Orbital Region  No depletion  Upper Arm Region  No depletion  Thoracic and Lumbar Region  Unable to assess  Buccal Region  No depletion  Temple Region  No depletion  Clavicle Bone Region  No depletion  Clavicle and Acromion Bone Region  No depletion  Scapular Bone Region  Unable to assess  Dorsal Hand  No depletion  Patellar Region  No depletion   Anterior Thigh Region  No depletion  Posterior Calf Region  No depletion  Edema (RD Assessment)  Moderate  Hair  Reviewed  Eyes  Reviewed  Mouth  Reviewed  Skin  Reviewed  Nails  Reviewed     Diet Order:  Diet heart healthy/carb modified Room service appropriate? Yes; Fluid consistency: Thin  EDUCATION NEEDS:   Education needs have been addressed  Skin:  Skin Assessment: Skin Integrity Issues: Skin Integrity Issues:: Other (Comment) Other: RD observed multiple bandages on bilateral legs  Last BM:  11/07/17  Height:   Ht Readings from Last 1 Encounters:  11/07/17 6\' 1"  (1.854 m)    Weight:   Wt Readings from Last 1 Encounters:  11/07/17 (!) 426 lb 11.2 oz (193.5 kg)    Ideal Body Weight:  83.6 kg  BMI:  Body mass index is 56.3 kg/m.  Estimated Nutritional Needs:   Kcal:  2500-2700 kcal  Protein:  125-135 g  Fluid:  >2.5 L/day    Mariana Single RD, LDN Clinical Nutrition Pager # - [phone]

## 2017-11-08 NOTE — Progress Notes (Signed)
LE venous duplex prelim: no DVT noted in visualized veins. Landry Mellow, RDMS, RVT

## 2017-11-08 NOTE — Progress Notes (Signed)
Brief Pharmacy note:  BMI > 30, lovenox adjusted to 0.5mg /kg for DVT ppx  Dolly Rias RPh 11/08/2017, 11:07 AM Pager [phone]

## 2017-11-08 NOTE — Progress Notes (Signed)
  Echocardiogram 2D Echocardiogram has been performed.  Darlina Sicilian M 11/08/2017, 2:13 PM

## 2017-11-08 NOTE — Progress Notes (Addendum)
Inpatient Diabetes Program Recommendations  AACE/ADA: New Consensus Statement on Inpatient Glycemic Control (2015)  Target Ranges:  Prepandial:   less than 140 mg/dL      Peak postprandial:   less than 180 mg/dL (1-2 hours)      Critically ill patients:  140 - 180 mg/dL   Lab Results  Component Value Date   GLUCAP 132 (H) 02/07/2017   HGBA1C 8.4 (H) 11/07/2017    Review of Glycemic Control  Diabetes history: None Outpatient Diabetes medications: N/A Current orders for Inpatient glycemic control: None  HgbA1C - 8.4% - ? New diagnosis DM If so, will begin diabetes education   Inpatient Diabetes Program Recommendations:     Add Novolog 0-9 units tidwc and hs   For discharge: lifestyle modification + metformin  Continue to follow while inpatient.  Thank you. Lorenda Peck, RD, LDN, CDE Inpatient Diabetes Coordinator [phone]  Addendum: Spoke with pt about new diagnosis of DM. Discussed importance of lifestyle modification of weight loss and exercise to control diabetes. Will need to obtain meter and supplies to check blood sugars at home. Will send diabetes book and order diabetes videos on pt ed channel. Follow-up with pt in am for questions. Would benefit from OP Diabetes Education. RV

## 2017-11-08 NOTE — Progress Notes (Signed)
LCSW consulted for med access.   LCSW notified RNCM of patients needs.  LCSW signing off. No CSW needs. Please submit new consult if CSW needs arise.   Carolin Coy [place]

## 2017-11-08 NOTE — Progress Notes (Signed)
PROGRESS NOTE    Lawrence [name]  AVW:[id number] DOB: [birth year] DOA: 11/07/2017 PCP: [facility]   Brief Narrative:  Patient is a 62 year old male history of congestive heart failure, hypertension, depression, sleep apnea admitted with bilateral lower extremity swelling and redness with concerns of edema.  Patient on Lasix twice daily along with Zaroxolyn.  Patient also noted some complaints of shortness of breath on exertion.  Patient was on CPAP at bedtime for sleep apnea.  Patient admitted placed empirically on IV antibiotics.   Assessment & Plan:   Principal Problem:   Cellulitis of lower extremity Active Problems:   Bilateral lower extremity edema   OSA (obstructive sleep apnea)   Morbid obesity (HCC)   Essential hypertension, benign   Physical deconditioning   Cellulitis   New onset type 2 diabetes mellitus ([place])  #1 bilateral lower extremity cellulitis right greater than left Improving clinically.  Patient was given dose of IV Lasix and Zaroxolyn with good urine output of 4.2 L over the past 24 hours.  ABIs were negative.  Lower extremity Dopplers negative for DVT.  2D echo pending.  Continue empiric IV Rocephin and Flagyl and Zaroxolyn.  We will transition from IV Lasix to home dose oral Lasix tomorrow.  Continue current wound care.  Outpatient follow-up.  2.  Obstructive sleep apnea CPAP nightly.  3.  Morbid obesity  4.  New onset type 2 diabetes mellitus HemoGlobin A1c is 8.4.  Placed on sliding scale insulin.  Check CBGs.  Patient has been seen by diabetic coordinator.  Likely discharge home metformin with outpatient follow-up.  5.  Hypertension Continue lisinopril and diuretics.    DVT prophylaxis: Lovenox Code Status: Full Family Communication: Updated patient and wife at bedside. Disposition Plan: Likely home once clinically improved.   Consultants:   Wound care consultant  Procedures:   2D echo 11/08/2017--- pending  ABIs 11/07/2017  Lower  extremity Dopplers 11/08/2017  Antimicrobials:   IV Rocephin for 09/2017  IV Flagyl 11/07/2017   Subjective: Patient laying in bed.  Feels lower extremity cellulitis slowly improving.  Denies any chest pain or shortness of breath.  Objective: Vitals:   11/07/17 2200 11/08/17 0608 11/08/17 0744 11/08/17 1424  BP:  131/67  127/75  Pulse: 89 89  83  Resp: 20 18  20   Temp:  97.7 F (36.5 C)  97.8 F (36.6 C)  TempSrc:  Oral  Oral  SpO2: 94% 92% 94% 93%  Weight:      Height:        Intake/Output Summary (Last 24 hours) at 11/08/2017 1925 Last data filed at 11/08/2017 1729 Gross per 24 hour  Intake 720 ml  Output 6400 ml  Net -5680 ml   Filed Weights   11/07/17 1731  Weight: (!) 193.5 kg (426 lb 11.2 oz)    Examination:  General exam: Appears calm and comfortable  Respiratory system: Clear to auscultation. Respiratory effort normal. Cardiovascular system: Distant heart sounds secondary to body habitus.  Regular rate and rhythm.  No murmurs rubs or gallops.  No significant lower extremity edema.  Gastrointestinal system: Abdomen is nondistended, soft and nontender. No organomegaly or masses felt. Normal bowel sounds heard. Central nervous system: Alert and oriented. No focal neurological deficits. Extremities: Bilateral lower extremities with less erythema right greater than left.  Decreased tenderness to palpation.  Decreased edema.  Skin: Bilateral lower extremity chronic venous stasis changes noted.  Bilateral shins covered with foam dressing. Psychiatry: Judgement and insight appear normal. Mood & affect  appropriate.     Data Reviewed: I have personally reviewed following labs and imaging studies  CBC: Recent Labs  Lab 11/07/17 1252 11/08/17 0619  WBC 11.8* 11.0*  NEUTROABS 9.5*  --   HGB 12.8* 12.9*  HCT 38.5* 40.7  MCV 89.7 91.9  PLT 279 035   Basic Metabolic Panel: Recent Labs  Lab 11/07/17 1252 11/08/17 0619  NA 135 135  K 4.0 3.9  CL 93* 92*  CO2 32  33*  GLUCOSE 161* 155*  BUN 30* 23*  CREATININE 0.88 0.92  CALCIUM 8.9 9.0   GFR: Estimated Creatinine Clearance: 151.4 mL/min (by C-G formula based on SCr of 0.92 mg/dL). Liver Function Tests: No results for input(s): AST, ALT, ALKPHOS, BILITOT, PROT, ALBUMIN in the last 168 hours. No results for input(s): LIPASE, AMYLASE in the last 168 hours. No results for input(s): AMMONIA in the last 168 hours. Coagulation Profile: No results for input(s): INR, PROTIME in the last 168 hours. Cardiac Enzymes: Recent Labs  Lab 11/08/17 1012 11/08/17 1537  TROPONINI <0.03 <0.03   BNP (last 3 results) No results for input(s): PROBNP in the last 8760 hours. HbA1C: Recent Labs    11/07/17 1831  HGBA1C 8.4*   CBG: No results for input(s): GLUCAP in the last 168 hours. Lipid Profile: No results for input(s): CHOL, HDL, LDLCALC, TRIG, CHOLHDL, LDLDIRECT in the last 72 hours. Thyroid Function Tests: Recent Labs    11/08/17 1012  TSH 2.246   Anemia Panel: No results for input(s): VITAMINB12, FOLATE, FERRITIN, TIBC, IRON, RETICCTPCT in the last 72 hours. Sepsis Labs: No results for input(s): PROCALCITON, LATICACIDVEN in the last 168 hours.  Recent Results (from the past 240 hour(s))  Culture, blood (routine x 2)     Status: None (Preliminary result)   Collection Time: 11/07/17 12:53 PM  Result Value Ref Range Status   Specimen Description   Final    BLOOD RIGHT ANTECUBITAL Performed at [facility] [address], [place], [place] [address]    Special Requests   Final    BOTTLES DRAWN AEROBIC AND ANAEROBIC Blood Culture adequate volume Performed at [facility] [address], [place], [place] [address]    Culture   Final    NO GROWTH < 24 HOURS Performed at [facility] [address], [place], [place] [address]    Report Status PENDING  Incomplete  Blood Cultures x 2 sites     Status: None (Preliminary result)   Collection Time:  11/07/17  1:42 PM  Result Value Ref Range Status   Specimen Description   Final    BLOOD RIGHT HAND Performed at [facility] [address] [place], [place] [address]    Special Requests   Final    BOTTLES DRAWN AEROBIC AND ANAEROBIC Blood Culture results may not be optimal due to an excessive volume of blood received in culture bottles Performed at [facility] [address] [place], [place] [address]    Culture   Final    NO GROWTH < 24 HOURS Performed at [facility] [address] [place], [place] [address]    Report Status PENDING  Incomplete  MRSA PCR Screening     Status: None   Collection Time: 11/07/17  4:10 PM  Result Value Ref Range Status   MRSA by PCR NEGATIVE NEGATIVE Final    Comment:        The GeneXpert MRSA Assay (FDA approved  for NASAL specimens only), is one component of a comprehensive MRSA colonization surveillance program. It is not intended to diagnose MRSA infection nor to guide or monitor treatment for MRSA infections. Performed at [facility], [place] [address] [place], [place] [address]          Radiology Studies: No results found.      Scheduled Meds: . ARIPiprazole  5 mg Oral QHS  . chlorhexidine  15 mL Mouth Rinse BID  . enoxaparin (LOVENOX) injection  95 mg Subcutaneous Q24H  . fluticasone  1 spray Each Nare Daily  . furosemide  80 mg Intravenous Q12H  . gabapentin  300 mg Oral 2 times per day   And  . gabapentin  600 mg Oral QHS  . lisinopril  10 mg Oral Daily  . living well with diabetes book   Does not apply Once  . mouth rinse  15 mL Mouth Rinse q12n4p  . metolazone  5 mg Oral BID  . nutrition supplement (JUVEN)  1 packet Oral BID BM  . potassium chloride  30 mEq Oral BID   Continuous Infusions: . cefTRIAXone (ROCEPHIN)  IV Stopped (11/08/17 1849)  . metronidazole 500 mg (11/08/17 1802)     LOS: 1 day    Time spent: 35 minutes    Irine Seal, MD [facility] Pager : [phone]  If 7PM-7AM, please contact night-coverage [email or web address] Password [facility] 11/08/2017, 7:25 PM

## 2017-11-09 LAB — CBC
HCT: 40 % (ref 39.0–52.0)
Hemoglobin: 13.2 g/dL (ref 13.0–17.0)
MCH: 29.5 pg (ref 26.0–34.0)
MCHC: 33 g/dL (ref 30.0–36.0)
MCV: 89.5 fL (ref 78.0–100.0)
Platelets: 278 10*3/uL (ref 150–400)
RBC: 4.47 MIL/uL (ref 4.22–5.81)
RDW: 14.8 % (ref 11.5–15.5)
WBC: 11.5 10*3/uL — ABNORMAL HIGH (ref 4.0–10.5)

## 2017-11-09 LAB — BASIC METABOLIC PANEL
Anion gap: 12 (ref 5–15)
BUN: 24 mg/dL — ABNORMAL HIGH (ref 6–20)
CO2: 30 mmol/L (ref 22–32)
Calcium: 8.9 mg/dL (ref 8.9–10.3)
Chloride: 89 mmol/L — ABNORMAL LOW (ref 101–111)
Creatinine, Ser: 0.9 mg/dL (ref 0.61–1.24)
GFR calc Af Amer: >60 mL/min (ref >60)
GFR calc non Af Amer: >60 mL/min (ref >60)
Glucose, Bld: 153 mg/dL — ABNORMAL HIGH (ref 65–99)
Potassium: 3.7 mmol/L (ref 3.5–5.1)
Sodium: 131 mmol/L — ABNORMAL LOW (ref 135–145)

## 2017-11-09 LAB — ECHOCARDIOGRAM COMPLETE
Height: 73 in
Weight: 6827.2 oz

## 2017-11-09 LAB — MAGNESIUM: Magnesium: 1.7 mg/dL (ref 1.7–2.4)

## 2017-11-09 LAB — GLUCOSE, CAPILLARY
Glucose-Capillary: 165 mg/dL — ABNORMAL HIGH (ref 65–99)
Glucose-Capillary: 145 mg/dL — ABNORMAL HIGH (ref 65–99)

## 2017-11-09 MED ORDER — CEPHALEXIN 500 MG PO CAPS: 500.0000 mg | ORAL_CAPSULE | Freq: Four times a day (QID) | ORAL | 0 refills | Status: DC

## 2017-11-09 MED ORDER — METFORMIN HCL 500 MG PO TABS: 500.0000 mg | ORAL_TABLET | Freq: Two times a day (BID) | ORAL | 0 refills | Status: DC

## 2017-11-09 MED ORDER — JUVEN PO PACK: 1.0000 | PACK | Freq: Two times a day (BID) | ORAL | 0 refills | Status: DC

## 2017-11-09 MED ORDER — MAGNESIUM SULFATE 4 GM/100ML IV SOLN
4.0000 g | Freq: Once | INTRAVENOUS | Status: AC
  Administered 2017-11-09: 4 g via INTRAVENOUS
  Filled 2017-11-09: qty 100

## 2017-11-09 MED ORDER — CEPHALEXIN 500 MG PO CAPS: 500.0000 mg | ORAL_CAPSULE | Freq: Four times a day (QID) | ORAL | 0 refills | Status: AC

## 2017-11-09 MED ORDER — BLOOD GLUCOSE METER KIT: PACK | 0 refills | Status: DC

## 2017-11-09 NOTE — Discharge Summary (Signed)
Physician Discharge Summary  Lawrence [name] RZN:[id number] DOB: 1956/03/18 DOA: 11/07/2017  PCP: [facility]  Admit date: 11/07/2017 Discharge date: 11/09/2017  Time spent: 55 minutes  Recommendations for Outpatient Follow-up:  1. Follow-up with [facility] as scheduled.  On follow-up patient's newly diagnosed diabetes mellitus type 2 need to be reassessed.  Patient will need a basic metabolic profile done to follow-up on electrolytes and renal function.  Patient's lower extremity cellulitis will need to be followed up upon.   Discharge Diagnoses:  Principal Problem:   Cellulitis of lower extremity Active Problems:   Bilateral lower extremity edema   OSA (obstructive sleep apnea)   Morbid obesity (HCC)   Essential hypertension, benign   Physical deconditioning   Cellulitis   New onset type 2 diabetes mellitus ([place])   Discharge Condition: Stable and improved  Diet recommendation: Heart healthy/carb modified  Filed Weights   11/07/17 1731  Weight: (!) 193.5 kg (426 lb 11.2 oz)    History of present illness:  Lawrence  Kha [name] is a 62 y.o. male with medical history significant of congestive heart failure, hypertension, depression, sleep apnea, admitted with bilateral lower extremity swelling and redness.  He reported his swelling had gotten worse in the last 4 weeks.  He went to [facility] and his doctor told him to come to the emergency room for admission for IV antibiotics.  He takes Lasix twice a day 80 mg.  Along with Zaroxolyn.  He did complain of shortness of breath and dyspnea on exertion.  Has a history of sleep apnea and uses CPAP at home.  He denied any fever or chills.  No nausea vomiting diarrhea urinary complaints abdominal pain.  He did have a history of recurrent cellulitis.  His fianc stated that he does have Ace wraps and stockings at home which are very hard to put on him.   ED Course: In the ER he received Ancef.  His vital signs were  09/02/1971 pulse 86 respirations 16 saturation 100% on room air his temperature is not recorded yet   Hospital Course:  #1 bilateral lower extremity cellulitis right greater than left Patient was admitted with bilateral lower extremity cellulitis right greater than left as well as his lower extremity edema.  Patient was given a dose of IV Lasix as well as Zaroxolyn with good urine output and was -6 L during this hospitalization.  BNP which was obtained was 28.6.  Cardiac enzymes which were cycled were negative x3.  Patient was placed empirically on IV Rocephin and IV Flagyl. ABIs were negative.  Lower extremity Dopplers negative for DVT.  2D echo with a EF of 60-65%, no wall motion abnormalities, grade 2 diastolic dysfunction.  Patient improved clinically and will be discharged home on oral Keflex for 5 more days to complete a 7-day course of antibiotic treatment.  Outpatient follow-up with PCP.  2.  Obstructive sleep apnea CPAP nightly.  3.  Morbid obesity  4.  New onset type 2 diabetes mellitus HemoGlobin A1c is 8.4.  Patient was maintained on sliding scale insulin during the hospitalization.  Patient was seen by diabetic coordinator.  Patient be discharged home on metformin with outpatient follow-up with PCP.   5.  Hypertension Patient was maintained on home regimen of lisinopril and diuretics.  Blood pressure remained stable.       Procedures:  2D echo 11/08/2017   ABIs 11/07/2017  Lower extremity Dopplers 11/08/2017      Consultations:  Wound care consultant  Discharge Exam: Vitals:   11/09/17 0504 11/09/17 1300  BP: (!) 109/55 (!) 114/56  Pulse: 94 92  Resp: 18 18  Temp: (!) 97.4 F (36.3 C) 98.7 F (37.1 C)  SpO2: 94% 90%    General: NAD Cardiovascular: RRR Respiratory: CTAB  Discharge Instructions   Discharge Instructions    Diet - low sodium heart healthy   Complete by:  As directed    Diet Carb Modified   Complete by:  As directed    Increase  activity slowly   Complete by:  As directed      Allergies as of 11/09/2017      Reactions   Tubersol [tuberculin] Other (See Comments)   Reaction unknown   Vancomycin Other (See Comments)   Red Man Syndrome   Doxycycline Rash      Medication List    STOP taking these medications   mupirocin cream 2 % Commonly known as:  BACTROBAN   torsemide 20 MG tablet Commonly known as:  DEMADEX     TAKE these medications   acetaminophen 500 MG tablet Commonly known as:  TYLENOL Take 500-1,000 mg by mouth 2 (two) times daily as needed for mild pain.   ARIPiprazole 5 MG tablet Commonly known as:  ABILIFY Take 5 mg by mouth at bedtime.   ASPERCREME EX Apply 1 application topically 2 (two) times daily as needed (pain and inflammation).   blood glucose meter kit and supplies Dispense based on patient and insurance preference. Use up to four times daily as directed. (FOR ICD-10 E10.9, E11.9).   cephALEXin 500 MG capsule Commonly known as:  KEFLEX Take 1 capsule (500 mg total) by mouth 4 (four) times daily for 5 days.   cetirizine 10 MG tablet Commonly known as:  ZYRTEC Take 10 mg by mouth daily.   fluticasone 50 MCG/ACT nasal spray Commonly known as:  FLONASE Place into both nostrils daily.   furosemide 80 MG tablet Commonly known as:  LASIX Take 80 mg by mouth 2 (two) times daily.   gabapentin 300 MG capsule Commonly known as:  NEURONTIN Take 300-600 mg by mouth 3 (three) times daily. 300 mg IN THE am, at 3 pm and 600 mg at bedtime   lisinopril 10 MG tablet Commonly known as:  PRINIVIL,ZESTRIL Take 10 mg by mouth daily.   loperamide 2 MG capsule Commonly known as:  IMODIUM Take 2 mg by mouth as needed for diarrhea or loose stools.   metFORMIN 500 MG tablet Commonly known as:  GLUCOPHAGE Take 1 tablet (500 mg total) by mouth 2 (two) times daily with a meal.   metolazone 2.5 MG tablet Commonly known as:  ZAROXOLYN Take 2.5 mg by mouth. Take on Mondays, Wednesdays  and Fridays   naproxen 375 MG tablet Commonly known as:  NAPROSYN Take 375 mg by mouth 2 (two) times daily.   nutrition supplement (JUVEN) Pack Take 1 packet by mouth 2 (two) times daily between meals. Start taking on:  11/10/2017   temazepam 7.5 MG capsule Commonly known as:  RESTORIL Take 7.5 mg by mouth at bedtime.   TRIAMCINOLONE ACETONIDE(NASAL) NA Place 2 sprays into the nose daily.      Allergies  Allergen Reactions  . Tubersol [Tuberculin] Other (See Comments)    Reaction unknown  . Vancomycin Other (See Comments)    Red Man Syndrome  . Doxycycline Rash   Follow-up [facility] Follow up.   Specialty:  General Practice Why:  f/u  as scheduled. Contact information: [place] [phone]            The results of significant diagnostics from this hospitalization (including imaging, microbiology, ancillary and laboratory) are listed below for reference.    Significant Diagnostic Studies: No results found.  Microbiology: Recent Results (from the past 240 hour(s))  Culture, blood (routine x 2)     Status: None (Preliminary result)   Collection Time: 11/07/17 12:53 PM  Result Value Ref Range Status   Specimen Description   Final    BLOOD RIGHT ANTECUBITAL Performed at [facility] [address], [place], [place] [address]    Special Requests   Final    BOTTLES DRAWN AEROBIC AND ANAEROBIC Blood Culture adequate volume Performed at [facility] [address] [place], [place] [address]    Culture   Final    NO GROWTH 2 DAYS Performed at [facility] [address] [place], [place] [address]    Report Status PENDING  Incomplete  Blood Cultures x 2 sites     Status: None (Preliminary result)   Collection Time: 11/07/17  1:42 PM  Result Value Ref Range Status   Specimen Description   Final    BLOOD RIGHT HAND Performed at [facility] [address], [place], [place] [address]    Special Requests   Final    BOTTLES DRAWN AEROBIC AND ANAEROBIC Blood Culture results may not be optimal due to an excessive volume of blood received in culture bottles Performed at [facility] [address] [place], [place] [address]    Culture   Final    NO GROWTH 2 DAYS Performed at [facility] [address] [place], [place] [address]    Report Status PENDING  Incomplete  MRSA PCR Screening     Status: None   Collection Time: 11/07/17  4:10 PM  Result Value Ref Range Status   MRSA by PCR NEGATIVE NEGATIVE Final    Comment:        The GeneXpert MRSA Assay (FDA approved for NASAL specimens only), is one component of a comprehensive MRSA colonization surveillance program. It is not intended to diagnose MRSA infection nor to guide or monitor treatment for MRSA infections. Performed at [facility], [place] [address] [place], [place] [address]      Labs: Basic Metabolic Panel: Recent Labs  Lab 11/07/17 1252 11/08/17 0619 11/09/17 0613  NA 135 135 131*  K 4.0 3.9 3.7  CL 93* 92* 89*  CO2 32 33* 30  GLUCOSE 161* 155* 153*  BUN 30* 23* 24*  CREATININE 0.88 0.92 0.90  CALCIUM 8.9 9.0 8.9  MG  --   --  1.7   Liver Function Tests: No results for input(s): AST, ALT, ALKPHOS, BILITOT, PROT, ALBUMIN in the last 168 hours. No results for input(s): LIPASE, AMYLASE in the last 168 hours. No results for input(s): AMMONIA in the last 168 hours. CBC: Recent Labs  Lab 11/07/17 1252 11/08/17 0619 11/09/17 0613  WBC 11.8* 11.0* 11.5*  NEUTROABS 9.5*  --   --   HGB 12.8* 12.9* 13.2  HCT 38.5* 40.7 40.0  MCV 89.7 91.9 89.5  PLT 279 298 278   Cardiac Enzymes: Recent Labs  Lab 11/08/17 1012 11/08/17 1537 11/08/17 2129  TROPONINI <0.03 <0.03 <0.03   BNP: BNP (last 3 results) Recent Labs    01/31/17 1048 11/08/17 1012  BNP 17.8 28.6    ProBNP (last 3  results) No results for input(s): PROBNP in the last 8760 hours.  CBG: Recent Labs  Lab 11/08/17 2001 11/09/17 0756 11/09/17 1200  GLUCAP 180* 165* 145*       Signed:  Irine Seal MD.  [facility] 11/09/2017, 4:49 PM

## 2017-11-09 NOTE — Progress Notes (Signed)
Bil leg wraps were taken off per patient request. Patient wanted a break from them and will reapply at a later time.

## 2017-11-12 LAB — CULTURE, BLOOD (ROUTINE X 2)
Culture: NO GROWTH
Culture: NO GROWTH
Special Requests: ADEQUATE

## 2018-01-12 ENCOUNTER — Emergency Department (HOSPITAL_COMMUNITY): Payer: No Typology Code available for payment source

## 2018-01-12 ENCOUNTER — Inpatient Hospital Stay (HOSPITAL_COMMUNITY)
Admission: EM | Admit: 2018-01-12 | Discharge: 2018-01-18 | DRG: 286 | Disposition: A | Discharge status: Home Health | Payer: No Typology Code available for payment source | Attending: Internal Medicine | Admitting: Internal Medicine

## 2018-01-12 DIAGNOSIS — Z8249 Family history of ischemic heart disease and other diseases of the circulatory system: Secondary | ICD-10-CM

## 2018-01-12 DIAGNOSIS — I110 Hypertensive heart disease with heart failure: Principal | ICD-10-CM | POA: Diagnosis present

## 2018-01-12 DIAGNOSIS — Z9989 Dependence on other enabling machines and devices: Secondary | ICD-10-CM

## 2018-01-12 DIAGNOSIS — I700 Atherosclerosis of aorta: Secondary | ICD-10-CM | POA: Diagnosis present

## 2018-01-12 DIAGNOSIS — M79671 Pain in right foot: Secondary | ICD-10-CM | POA: Diagnosis present

## 2018-01-12 DIAGNOSIS — E785 Hyperlipidemia, unspecified: Secondary | ICD-10-CM | POA: Diagnosis present

## 2018-01-12 DIAGNOSIS — Z96652 Presence of left artificial knee joint: Secondary | ICD-10-CM | POA: Diagnosis present

## 2018-01-12 DIAGNOSIS — F329 Major depressive disorder, single episode, unspecified: Secondary | ICD-10-CM | POA: Diagnosis present

## 2018-01-12 DIAGNOSIS — I10 Essential (primary) hypertension: Secondary | ICD-10-CM | POA: Diagnosis present

## 2018-01-12 DIAGNOSIS — I2510 Atherosclerotic heart disease of native coronary artery without angina pectoris: Secondary | ICD-10-CM | POA: Diagnosis present

## 2018-01-12 DIAGNOSIS — F419 Anxiety disorder, unspecified: Secondary | ICD-10-CM | POA: Diagnosis present

## 2018-01-12 DIAGNOSIS — M19012 Primary osteoarthritis, left shoulder: Secondary | ICD-10-CM | POA: Diagnosis present

## 2018-01-12 DIAGNOSIS — Z66 Do not resuscitate: Secondary | ICD-10-CM | POA: Diagnosis present

## 2018-01-12 DIAGNOSIS — R0902 Hypoxemia: Secondary | ICD-10-CM

## 2018-01-12 DIAGNOSIS — L03115 Cellulitis of right lower limb: Secondary | ICD-10-CM | POA: Diagnosis present

## 2018-01-12 DIAGNOSIS — Z881 Allergy status to other antibiotic agents status: Secondary | ICD-10-CM

## 2018-01-12 DIAGNOSIS — J9601 Acute respiratory failure with hypoxia: Secondary | ICD-10-CM | POA: Diagnosis present

## 2018-01-12 DIAGNOSIS — R0602 Shortness of breath: Secondary | ICD-10-CM | POA: Diagnosis not present

## 2018-01-12 DIAGNOSIS — L03116 Cellulitis of left lower limb: Secondary | ICD-10-CM | POA: Diagnosis present

## 2018-01-12 DIAGNOSIS — E669 Obesity, unspecified: Secondary | ICD-10-CM

## 2018-01-12 DIAGNOSIS — Z87891 Personal history of nicotine dependence: Secondary | ICD-10-CM

## 2018-01-12 DIAGNOSIS — E1169 Type 2 diabetes mellitus with other specified complication: Secondary | ICD-10-CM | POA: Diagnosis present

## 2018-01-12 DIAGNOSIS — Z887 Allergy status to serum and vaccine status: Secondary | ICD-10-CM

## 2018-01-12 DIAGNOSIS — R079 Chest pain, unspecified: Secondary | ICD-10-CM

## 2018-01-12 DIAGNOSIS — E876 Hypokalemia: Secondary | ICD-10-CM | POA: Diagnosis not present

## 2018-01-12 DIAGNOSIS — I5033 Acute on chronic diastolic (congestive) heart failure: Secondary | ICD-10-CM | POA: Diagnosis present

## 2018-01-12 DIAGNOSIS — R0600 Dyspnea, unspecified: Secondary | ICD-10-CM

## 2018-01-12 DIAGNOSIS — G4733 Obstructive sleep apnea (adult) (pediatric): Secondary | ICD-10-CM | POA: Diagnosis present

## 2018-01-12 DIAGNOSIS — Z6841 Body Mass Index (BMI) 40.0 and over, adult: Secondary | ICD-10-CM

## 2018-01-12 DIAGNOSIS — M10071 Idiopathic gout, right ankle and foot: Secondary | ICD-10-CM | POA: Diagnosis present

## 2018-01-12 DIAGNOSIS — J441 Chronic obstructive pulmonary disease with (acute) exacerbation: Secondary | ICD-10-CM | POA: Diagnosis present

## 2018-01-12 DIAGNOSIS — Z79899 Other long term (current) drug therapy: Secondary | ICD-10-CM

## 2018-01-12 DIAGNOSIS — E7800 Pure hypercholesterolemia, unspecified: Secondary | ICD-10-CM

## 2018-01-12 DIAGNOSIS — E1165 Type 2 diabetes mellitus with hyperglycemia: Secondary | ICD-10-CM | POA: Diagnosis present

## 2018-01-12 DIAGNOSIS — F4310 Post-traumatic stress disorder, unspecified: Secondary | ICD-10-CM | POA: Diagnosis present

## 2018-01-12 DIAGNOSIS — E1142 Type 2 diabetes mellitus with diabetic polyneuropathy: Secondary | ICD-10-CM | POA: Diagnosis present

## 2018-01-12 DIAGNOSIS — Z7984 Long term (current) use of oral hypoglycemic drugs: Secondary | ICD-10-CM

## 2018-01-12 LAB — BASIC METABOLIC PANEL
Anion gap: 14 (ref 5–15)
BUN: 25 mg/dL — ABNORMAL HIGH (ref 6–20)
CO2: 28 mmol/L (ref 22–32)
Calcium: 8.9 mg/dL (ref 8.9–10.3)
Chloride: 89 mmol/L — ABNORMAL LOW (ref 101–111)
Creatinine, Ser: 1.03 mg/dL (ref 0.61–1.24)
GFR calc Af Amer: >60 mL/min (ref >60)
GFR calc non Af Amer: >60 mL/min (ref >60)
Glucose, Bld: 107 mg/dL — ABNORMAL HIGH (ref 65–99)
Potassium: 3.5 mmol/L (ref 3.5–5.1)
Sodium: 131 mmol/L — ABNORMAL LOW (ref 135–145)

## 2018-01-12 LAB — I-STAT CHEM 8, ED
BUN: 26 mg/dL — ABNORMAL HIGH (ref 6–20)
Calcium, Ion: 1.04 mmol/L — ABNORMAL LOW (ref 1.15–1.40)
Chloride: 89 mmol/L — ABNORMAL LOW (ref 101–111)
Creatinine, Ser: 1 mg/dL (ref 0.61–1.24)
Glucose, Bld: 116 mg/dL — ABNORMAL HIGH (ref 65–99)
HCT: 40 % (ref 39.0–52.0)
Hemoglobin: 13.6 g/dL (ref 13.0–17.0)
Potassium: 3.3 mmol/L — ABNORMAL LOW (ref 3.5–5.1)
Sodium: 132 mmol/L — ABNORMAL LOW (ref 135–145)
TCO2: 29 mmol/L (ref 22–32)

## 2018-01-12 LAB — CBC
HCT: 39.5 % (ref 39.0–52.0)
Hemoglobin: 13 g/dL (ref 13.0–17.0)
MCH: 28.5 pg (ref 26.0–34.0)
MCHC: 32.9 g/dL (ref 30.0–36.0)
MCV: 86.6 fL (ref 78.0–100.0)
Platelets: 323 10*3/uL (ref 150–400)
RBC: 4.56 MIL/uL (ref 4.22–5.81)
RDW: 13.7 % (ref 11.5–15.5)
WBC: 12.7 10*3/uL — ABNORMAL HIGH (ref 4.0–10.5)

## 2018-01-12 LAB — I-STAT TROPONIN, ED: Troponin i, poc: 0 ng/mL (ref 0.00–0.08)

## 2018-01-12 LAB — BRAIN NATRIURETIC PEPTIDE: B Natriuretic Peptide: 29.3 pg/mL (ref 0.0–100.0)

## 2018-01-12 MED ORDER — ONDANSETRON HCL 4 MG/2ML IJ SOLN
4.0000 mg | Freq: Once | INTRAMUSCULAR | Status: AC
  Administered 2018-01-13: 4 mg via INTRAVENOUS
  Filled 2018-01-12: qty 2

## 2018-01-12 MED ORDER — FENTANYL CITRATE (PF) 100 MCG/2ML IJ SOLN
50.0000 ug | Freq: Once | INTRAMUSCULAR | Status: AC
  Administered 2018-01-13: 50 ug via INTRAVENOUS
  Filled 2018-01-12: qty 2

## 2018-01-12 NOTE — ED Provider Notes (Signed)
[facility] EMERGENCY DEPARTMENT Provider Note   CSN: [id number] Arrival date & time: 01/12/18  2134     History   Chief Complaint Chief Complaint  Patient presents with  . Shortness of Breath    HPI Lawrence [name] is a 62 y.o. male who presents the emergency department with chief complaint of shortness of breath.  This is been an ongoing problem.  Patient states that he is barely able to get around his house.  He has been seen at the [facility] and had a audiology work-up and is awaiting a pulmonology work-up.  He complains of peripheral edema and swelling but has not noted any significant change of the past several months.  Patient states that he uses a CPAP machine at night and when he is having difficulty breathing this usually helps him however tonight he felt extremely short of breath when lying flat.  He denies unilateral leg swelling or pain.  He denies active chest pain, hemoptysis.  Patient has redness of the bilateral knees and is apparently having a lot of pain.  EMS reports that he had difficulty even standing to transfer onto the gurney.  HPI  Past Medical History:  Diagnosis Date  . Anxiety   . Arthritis   . CHF (congestive heart failure) ([place])   . Chronic cough   . Depression   . Hypertension   . New onset type 2 diabetes mellitus ([place]) 11/08/2017  . PTSD (post-traumatic stress disorder)   . Sleep apnea     Patient Active Problem List   Diagnosis Date Noted  . New onset type 2 diabetes mellitus ([place]) 11/08/2017  . Cellulitis 11/07/2017  . Primary osteoarthritis, left shoulder 03/05/2017  . Essential hypertension, benign 02/22/2017  . BPH without obstruction/lower urinary tract symptoms 02/22/2017  . Hematuria 02/22/2017  . Peripheral neuropathy 02/22/2017  . Physical deconditioning 02/22/2017  . Morbid obesity ([place])   . PTSD (post-traumatic stress disorder)   . Anasarca 01/31/2017  . Bilateral lower extremity edema 01/31/2017  . Cellulitis of  lower extremity   . OSA (obstructive sleep apnea)     Past Surgical History:  Procedure Laterality Date  . HERNIA REPAIR    . JOINT REPLACEMENT     left knee replacement x 2  . KNEE ARTHROSCOPY          Home Medications    Prior to Admission medications   Medication Sig Start Date End Date Taking? Authorizing Provider  acetaminophen (TYLENOL) 500 MG tablet Take 500-1,000 mg by mouth 2 (two) times daily as needed for mild pain.    [provider]  ARIPiprazole (ABILIFY) 5 MG tablet Take 5 mg by mouth at bedtime.     [provider]  blood glucose meter kit and supplies Dispense based on patient and insurance preference. Use up to four times daily as directed. (FOR ICD-10 E10.9, E11.9). 11/09/17   Eugenie Filler, MD  cetirizine (ZYRTEC) 10 MG tablet Take 10 mg by mouth daily.    [provider]  fluticasone (FLONASE) 50 MCG/ACT nasal spray Place into both nostrils daily.    [provider]  furosemide (LASIX) 80 MG tablet Take 80 mg by mouth 2 (two) times daily.    [provider]  gabapentin (NEURONTIN) 300 MG capsule Take 300-600 mg by mouth 3 (three) times daily. 300 mg IN THE am, at 3 pm and 600 mg at bedtime    [provider]  lisinopril (PRINIVIL,ZESTRIL) 10 MG tablet Take 10  mg by mouth daily.    [provider]  loperamide (IMODIUM) 2 MG capsule Take 2 mg by mouth as needed for diarrhea or loose stools.    [provider]  metFORMIN (GLUCOPHAGE) 500 MG tablet Take 1 tablet (500 mg total) by mouth 2 (two) times daily with a meal. 11/09/17 11/09/18  Eugenie Filler, MD  metolazone (ZAROXOLYN) 2.5 MG tablet Take 2.5 mg by mouth. Take on Mondays, Wednesdays and Fridays    [provider]  naproxen (NAPROSYN) 375 MG tablet Take 375 mg by mouth 2 (two) times daily.     [provider]  nutrition supplement, JUVEN, (JUVEN) PACK Take 1 packet by mouth 2 (two) times daily between meals. 11/10/17    Eugenie Filler, MD  temazepam (RESTORIL) 7.5 MG capsule Take 7.5 mg by mouth at bedtime.    [provider]  TRIAMCINOLONE ACETONIDE,NASAL, NA Place 2 sprays into the nose daily.    [provider]  Trolamine Salicylate (ASPERCREME EX) Apply 1 application topically 2 (two) times daily as needed (pain and inflammation).    [provider]    Family History No family history on file.  Social History Social History   Tobacco Use  . Smoking status: Former Smoker    Last attempt to quit: 03/07/2016    Years since quitting: 1.8  . Smokeless tobacco: Never Used  Substance Use Topics  . Alcohol use: Yes    Comment: rare  . Drug use: No     Allergies   Tubersol [tuberculin]; Vancomycin; and Doxycycline   Review of Systems Review of Systems Ten systems reviewed and are negative for acute change, except as noted in the HPI.    Physical Exam Updated Vital Signs BP 93/77   Pulse 87   Temp 98.4 F (36.9 C) (Oral)   Resp 14   SpO2 94%   Physical Exam  Constitutional: He appears well-developed and well-nourished. No distress.  HENT:  Head: Normocephalic and atraumatic.  Eyes: Conjunctivae are normal. No scleral icterus.  Neck: Normal range of motion. Neck supple. No JVD present.  Cardiovascular: Normal rate, regular rhythm and normal heart sounds.  Pulmonary/Chest: Effort normal and breath sounds normal. No respiratory distress.  Abdominal: Soft. He exhibits distension. There is no tenderness.  Musculoskeletal:       Right lower leg: He exhibits edema.       Left lower leg: He exhibits edema.  Neurological: He is alert.  Skin: Skin is warm and dry. He is not diaphoretic.  Psychiatric: His behavior is normal.  Nursing note and vitals reviewed.    ED Treatments / Results  Labs (all labs ordered are listed, but only abnormal results are displayed) Labs Reviewed  BASIC METABOLIC PANEL - Abnormal; Notable for the following components:       Result Value   Sodium 131 (*)    Chloride 89 (*)    Glucose, Bld 107 (*)    BUN 25 (*)    All other components within normal limits  CBC - Abnormal; Notable for the following components:   WBC 12.7 (*)    All other components within normal limits  I-STAT CHEM 8, ED - Abnormal; Notable for the following components:   Sodium 132 (*)    Potassium 3.3 (*)    Chloride 89 (*)    BUN 26 (*)    Glucose, Bld 116 (*)    Calcium, Ion 1.04 (*)    All other components within normal  limits  BRAIN NATRIURETIC PEPTIDE  I-STAT TROPONIN, ED    EKG EKG Interpretation  Date/Time:  Friday January 12 2018 21:39:38 EDT Ventricular Rate:  85 PR Interval:  196 QRS Duration: 110 QT Interval:  402 QTC Calculation: 478 R Axis:   43 Text Interpretation:  Normal sinus rhythm Low voltage QRS Borderline ECG No significant change since last tracing Confirmed by Duffy Bruce [phone]) on 01/13/2018 12:15:45 AM   Radiology Dg Chest 2 View  Result Date: 01/12/2018 CLINICAL DATA:  Initial evaluation for acute shortness of breath. History of CHF and COPD. EXAM: CHEST - 2 VIEW COMPARISON:  Prior radiograph from 01/31/2017. FINDINGS: Stable cardiomegaly. Mediastinal silhouette normal. Aortic atherosclerosis. Lungs normally inflated. Chronic bronchitic changes, consistent with history of COPD. No consolidative airspace opacity to suggest pneumonia. No pulmonary edema or pleural effusion. No pneumothorax. No acute osseous abnormality. Degenerative changes noted throughout the visualized spine. IMPRESSION: 1. Chronic changes related to COPD. No focal infiltrates or other active cardiopulmonary disease. 2. Stable cardiomegaly without pulmonary edema. Electronically Signed   By: Jeannine Boga M.D.   On: 01/12/2018 22:33    Procedures Procedures (including critical care time)  Medications Ordered in ED Medications - No data to display   Initial Impression / Assessment and Plan / ED Course  I have reviewed the  triage vital signs and the nursing notes.  Pertinent labs & imaging results that were available during my care of the patient were reviewed by me and considered in my medical decision making (see chart for details).     With hypoxia, CHF versus COPD.  Given DuoNeb and IV Lasix 40 mg.  Given his obesity BNP may be falsely low.  Chest x-ray does not show pulmonary edema.  Does have bibasilar crackles suggestive of fluid. Patient will be admitted by Dr. Hal Hope. Stable through ED visit EKG reviewed without any significant changes.  Final Clinical Impressions(s) / ED Diagnoses   Final diagnoses:  Shortness of breath  Hypoxia    ED Discharge Orders    None       Khyran, Riera, PA-C 01/13/18 0134    Duffy Bruce, MD 01/13/18 [id number]

## 2018-01-12 NOTE — ED Triage Notes (Signed)
Patient BIB [facility] for increasing SOB x 1 week. Patient states he's tried to deal with the SOB but tonight while putting his cPAP on he felt like he couldn't catch his breath. Hx of COPD and CHF. Pt also c/o increased swelling bilateral lower extremities and weakness. Denies chest pain, nausea, vomiting, and abdominal pain.

## 2018-01-12 NOTE — ED Notes (Signed)
ED provider at bedside.

## 2018-01-13 ENCOUNTER — Other Ambulatory Visit: Payer: Self-pay

## 2018-01-13 ENCOUNTER — Inpatient Hospital Stay (HOSPITAL_COMMUNITY): Payer: No Typology Code available for payment source

## 2018-01-13 ENCOUNTER — Encounter (HOSPITAL_COMMUNITY): Payer: Self-pay | Admitting: General Practice

## 2018-01-13 ENCOUNTER — Inpatient Hospital Stay (HOSPITAL_COMMUNITY): Payer: Non-veteran care

## 2018-01-13 DIAGNOSIS — Z66 Do not resuscitate: Secondary | ICD-10-CM | POA: Diagnosis present

## 2018-01-13 DIAGNOSIS — J9601 Acute respiratory failure with hypoxia: Secondary | ICD-10-CM | POA: Diagnosis present

## 2018-01-13 DIAGNOSIS — F419 Anxiety disorder, unspecified: Secondary | ICD-10-CM | POA: Diagnosis present

## 2018-01-13 DIAGNOSIS — E1169 Type 2 diabetes mellitus with other specified complication: Secondary | ICD-10-CM | POA: Diagnosis present

## 2018-01-13 DIAGNOSIS — M79671 Pain in right foot: Secondary | ICD-10-CM

## 2018-01-13 DIAGNOSIS — E669 Obesity, unspecified: Secondary | ICD-10-CM | POA: Diagnosis not present

## 2018-01-13 DIAGNOSIS — M19012 Primary osteoarthritis, left shoulder: Secondary | ICD-10-CM | POA: Diagnosis present

## 2018-01-13 DIAGNOSIS — I110 Hypertensive heart disease with heart failure: Secondary | ICD-10-CM | POA: Diagnosis present

## 2018-01-13 DIAGNOSIS — G4733 Obstructive sleep apnea (adult) (pediatric): Secondary | ICD-10-CM | POA: Diagnosis present

## 2018-01-13 DIAGNOSIS — I10 Essential (primary) hypertension: Secondary | ICD-10-CM | POA: Diagnosis not present

## 2018-01-13 DIAGNOSIS — Z9989 Dependence on other enabling machines and devices: Secondary | ICD-10-CM | POA: Diagnosis not present

## 2018-01-13 DIAGNOSIS — Z96652 Presence of left artificial knee joint: Secondary | ICD-10-CM | POA: Diagnosis present

## 2018-01-13 DIAGNOSIS — E785 Hyperlipidemia, unspecified: Secondary | ICD-10-CM | POA: Diagnosis present

## 2018-01-13 DIAGNOSIS — I509 Heart failure, unspecified: Secondary | ICD-10-CM | POA: Diagnosis not present

## 2018-01-13 DIAGNOSIS — L03116 Cellulitis of left lower limb: Secondary | ICD-10-CM | POA: Diagnosis present

## 2018-01-13 DIAGNOSIS — E1165 Type 2 diabetes mellitus with hyperglycemia: Secondary | ICD-10-CM | POA: Diagnosis present

## 2018-01-13 DIAGNOSIS — L03115 Cellulitis of right lower limb: Secondary | ICD-10-CM | POA: Diagnosis present

## 2018-01-13 DIAGNOSIS — I700 Atherosclerosis of aorta: Secondary | ICD-10-CM | POA: Diagnosis present

## 2018-01-13 DIAGNOSIS — M10071 Idiopathic gout, right ankle and foot: Secondary | ICD-10-CM | POA: Diagnosis present

## 2018-01-13 DIAGNOSIS — R0789 Other chest pain: Secondary | ICD-10-CM | POA: Diagnosis not present

## 2018-01-13 DIAGNOSIS — I2510 Atherosclerotic heart disease of native coronary artery without angina pectoris: Secondary | ICD-10-CM | POA: Diagnosis present

## 2018-01-13 DIAGNOSIS — F329 Major depressive disorder, single episode, unspecified: Secondary | ICD-10-CM | POA: Diagnosis present

## 2018-01-13 DIAGNOSIS — Z6841 Body Mass Index (BMI) 40.0 and over, adult: Secondary | ICD-10-CM | POA: Diagnosis not present

## 2018-01-13 DIAGNOSIS — F4310 Post-traumatic stress disorder, unspecified: Secondary | ICD-10-CM | POA: Diagnosis present

## 2018-01-13 DIAGNOSIS — I5033 Acute on chronic diastolic (congestive) heart failure: Secondary | ICD-10-CM | POA: Diagnosis present

## 2018-01-13 DIAGNOSIS — E876 Hypokalemia: Secondary | ICD-10-CM | POA: Diagnosis not present

## 2018-01-13 DIAGNOSIS — R609 Edema, unspecified: Secondary | ICD-10-CM | POA: Diagnosis not present

## 2018-01-13 DIAGNOSIS — J441 Chronic obstructive pulmonary disease with (acute) exacerbation: Secondary | ICD-10-CM | POA: Diagnosis present

## 2018-01-13 DIAGNOSIS — E1142 Type 2 diabetes mellitus with diabetic polyneuropathy: Secondary | ICD-10-CM | POA: Diagnosis present

## 2018-01-13 DIAGNOSIS — R0602 Shortness of breath: Secondary | ICD-10-CM | POA: Diagnosis present

## 2018-01-13 HISTORY — DX: Acute on chronic diastolic (congestive) heart failure: I50.33

## 2018-01-13 HISTORY — DX: Pain in right foot: M79.671

## 2018-01-13 HISTORY — DX: Acute respiratory failure with hypoxia: J96.01

## 2018-01-13 LAB — CBC WITH DIFFERENTIAL/PLATELET
Abs Immature Granulocytes: 0.1 10*3/uL (ref 0.0–0.1)
Basophils Absolute: 0.1 10*3/uL (ref 0.0–0.1)
Basophils Relative: 1 %
Eosinophils Absolute: 0.5 10*3/uL (ref 0.0–0.7)
Eosinophils Relative: 5 %
HCT: 39.8 % (ref 39.0–52.0)
Hemoglobin: 12.9 g/dL — ABNORMAL LOW (ref 13.0–17.0)
Immature Granulocytes: 1 %
Lymphocytes Relative: 13 %
Lymphs Abs: 1.5 10*3/uL (ref 0.7–4.0)
MCH: 28.5 pg (ref 26.0–34.0)
MCHC: 32.4 g/dL (ref 30.0–36.0)
MCV: 88.1 fL (ref 78.0–100.0)
Monocytes Absolute: 0.9 10*3/uL (ref 0.1–1.0)
Monocytes Relative: 7 %
Neutro Abs: 8.7 10*3/uL — ABNORMAL HIGH (ref 1.7–7.7)
Neutrophils Relative %: 73 %
Platelets: 316 10*3/uL (ref 150–400)
RBC: 4.52 MIL/uL (ref 4.22–5.81)
RDW: 13.8 % (ref 11.5–15.5)
WBC: 11.8 10*3/uL — ABNORMAL HIGH (ref 4.0–10.5)

## 2018-01-13 LAB — COMPREHENSIVE METABOLIC PANEL
ALT: 21 U/L (ref 17–63)
AST: 15 U/L (ref 15–41)
Albumin: 3.4 g/dL — ABNORMAL LOW (ref 3.5–5.0)
Alkaline Phosphatase: 59 U/L (ref 38–126)
Anion gap: 13 (ref 5–15)
BUN: 24 mg/dL — ABNORMAL HIGH (ref 6–20)
CO2: 30 mmol/L (ref 22–32)
Calcium: 8.8 mg/dL — ABNORMAL LOW (ref 8.9–10.3)
Chloride: 89 mmol/L — ABNORMAL LOW (ref 101–111)
Creatinine, Ser: 1.07 mg/dL (ref 0.61–1.24)
GFR calc Af Amer: >60 mL/min (ref >60)
GFR calc non Af Amer: >60 mL/min (ref >60)
Glucose, Bld: 138 mg/dL — ABNORMAL HIGH (ref 65–99)
Potassium: 3.4 mmol/L — ABNORMAL LOW (ref 3.5–5.1)
Sodium: 132 mmol/L — ABNORMAL LOW (ref 135–145)
Total Bilirubin: 0.8 mg/dL (ref 0.3–1.2)
Total Protein: 6.4 g/dL — ABNORMAL LOW (ref 6.5–8.1)

## 2018-01-13 LAB — TROPONIN I
Troponin I: <0.03 ng/mL (ref <0.03)
Troponin I: <0.03 ng/mL (ref <0.03)
Troponin I: <0.03 ng/mL (ref <0.03)
Troponin I: <0.03 ng/mL (ref <0.03)

## 2018-01-13 LAB — I-STAT ARTERIAL BLOOD GAS, ED
Acid-Base Excess: 7 mmol/L — ABNORMAL HIGH (ref 0.0–2.0)
Bicarbonate: 32.4 mmol/L — ABNORMAL HIGH (ref 20.0–28.0)
O2 Saturation: 89 %
Patient temperature: 98.6
TCO2: 34 mmol/L — ABNORMAL HIGH (ref 22–32)
pCO2 arterial: 48.1 mmHg — ABNORMAL HIGH (ref 32.0–48.0)
pH, Arterial: 7.436 (ref 7.350–7.450)
pO2, Arterial: 55 mmHg — ABNORMAL LOW (ref 83.0–108.0)

## 2018-01-13 LAB — GLUCOSE, CAPILLARY
Glucose-Capillary: 127 mg/dL — ABNORMAL HIGH (ref 65–99)
Glucose-Capillary: 148 mg/dL — ABNORMAL HIGH (ref 65–99)
Glucose-Capillary: 191 mg/dL — ABNORMAL HIGH (ref 65–99)
Glucose-Capillary: 207 mg/dL — ABNORMAL HIGH (ref 65–99)
Glucose-Capillary: 142 mg/dL — ABNORMAL HIGH (ref 65–99)

## 2018-01-13 LAB — MAGNESIUM: Magnesium: 1.7 mg/dL (ref 1.7–2.4)

## 2018-01-13 LAB — C-REACTIVE PROTEIN: CRP: 2.1 mg/dL — ABNORMAL HIGH (ref <1.0)

## 2018-01-13 LAB — D-DIMER, QUANTITATIVE: D-Dimer, Quant: 1.71 ug/mL-FEU — ABNORMAL HIGH (ref 0.00–0.50)

## 2018-01-13 LAB — TSH: TSH: 1.691 u[IU]/mL (ref 0.350–4.500)

## 2018-01-13 LAB — SEDIMENTATION RATE: Sed Rate: 28 mm/hr — ABNORMAL HIGH (ref 0–16)

## 2018-01-13 LAB — URIC ACID: Uric Acid, Serum: 11.8 mg/dL — ABNORMAL HIGH (ref 4.4–7.6)

## 2018-01-13 MED ORDER — IOPAMIDOL (ISOVUE-370) INJECTION 76%
INTRAVENOUS | Status: AC
  Filled 2018-01-13: qty 100

## 2018-01-13 MED ORDER — IPRATROPIUM-ALBUTEROL 0.5-2.5 (3) MG/3ML IN SOLN
3.0000 mL | RESPIRATORY_TRACT | Status: DC
  Administered 2018-01-13: 3 mL via RESPIRATORY_TRACT
  Filled 2018-01-13: qty 3

## 2018-01-13 MED ORDER — COLCHICINE 0.6 MG PO TABS
1.2000 mg | ORAL_TABLET | Freq: Once | ORAL | Status: AC
  Administered 2018-01-13: 1.2 mg via ORAL
  Filled 2018-01-13: qty 2

## 2018-01-13 MED ORDER — COLCHICINE 0.6 MG PO TABS
0.6000 mg | ORAL_TABLET | Freq: Every day | ORAL | Status: DC
  Administered 2018-01-14 – 2018-01-18 (×5): 0.6 mg via ORAL
  Filled 2018-01-13 (×5): qty 1

## 2018-01-13 MED ORDER — ACETAMINOPHEN 650 MG RE SUPP: 650.0000 mg | Freq: Four times a day (QID) | RECTAL | Status: DC | PRN

## 2018-01-13 MED ORDER — FUROSEMIDE 10 MG/ML IJ SOLN
40.0000 mg | Freq: Once | INTRAMUSCULAR | Status: AC
  Administered 2018-01-13: 40 mg via INTRAVENOUS
  Filled 2018-01-13: qty 4

## 2018-01-13 MED ORDER — POTASSIUM CHLORIDE CRYS ER 20 MEQ PO TBCR
20.0000 meq | EXTENDED_RELEASE_TABLET | Freq: Every day | ORAL | Status: DC
  Administered 2018-01-13 – 2018-01-16 (×4): 20 meq via ORAL
  Filled 2018-01-13 (×6): qty 1

## 2018-01-13 MED ORDER — FLUTICASONE PROPIONATE 50 MCG/ACT NA SUSP
2.0000 | Freq: Every day | NASAL | Status: DC
  Administered 2018-01-13 – 2018-01-18 (×6): 2 via NASAL
  Filled 2018-01-13: qty 16

## 2018-01-13 MED ORDER — IPRATROPIUM-ALBUTEROL 0.5-2.5 (3) MG/3ML IN SOLN
3.0000 mL | Freq: Three times a day (TID) | RESPIRATORY_TRACT | Status: DC
  Administered 2018-01-13 – 2018-01-16 (×8): 3 mL via RESPIRATORY_TRACT
  Filled 2018-01-13 (×9): qty 3

## 2018-01-13 MED ORDER — ONDANSETRON HCL 4 MG PO TABS: 4.0000 mg | ORAL_TABLET | Freq: Four times a day (QID) | ORAL | Status: DC | PRN

## 2018-01-13 MED ORDER — GABAPENTIN 300 MG PO CAPS
600.0000 mg | ORAL_CAPSULE | Freq: Every day | ORAL | Status: DC
  Administered 2018-01-13 – 2018-01-17 (×5): 600 mg via ORAL
  Filled 2018-01-13 (×5): qty 2

## 2018-01-13 MED ORDER — IOPAMIDOL (ISOVUE-370) INJECTION 76%
100.0000 mL | Freq: Once | INTRAVENOUS | Status: AC | PRN
  Administered 2018-01-13: 100 mL via INTRAVENOUS

## 2018-01-13 MED ORDER — ONDANSETRON HCL 4 MG/2ML IJ SOLN: 4.0000 mg | Freq: Four times a day (QID) | INTRAMUSCULAR | Status: DC | PRN

## 2018-01-13 MED ORDER — INSULIN ASPART 100 UNIT/ML ~~LOC~~ SOLN
0.0000 [IU] | Freq: Three times a day (TID) | SUBCUTANEOUS | Status: DC
  Administered 2018-01-13: 3 [IU] via SUBCUTANEOUS
  Administered 2018-01-13: 2 [IU] via SUBCUTANEOUS
  Administered 2018-01-13: 1 [IU] via SUBCUTANEOUS
  Administered 2018-01-14 (×3): 2 [IU] via SUBCUTANEOUS
  Administered 2018-01-15: 5 [IU] via SUBCUTANEOUS
  Administered 2018-01-15 – 2018-01-16 (×2): 3 [IU] via SUBCUTANEOUS
  Administered 2018-01-16: 2 [IU] via SUBCUTANEOUS
  Administered 2018-01-17: 1 [IU] via SUBCUTANEOUS
  Administered 2018-01-17 (×2): 3 [IU] via SUBCUTANEOUS
  Administered 2018-01-18: 1 [IU] via SUBCUTANEOUS

## 2018-01-13 MED ORDER — TEMAZEPAM 15 MG PO CAPS
15.0000 mg | ORAL_CAPSULE | Freq: Every day | ORAL | Status: DC
  Administered 2018-01-13 – 2018-01-17 (×5): 15 mg via ORAL
  Filled 2018-01-13 (×5): qty 1

## 2018-01-13 MED ORDER — METOLAZONE 2.5 MG PO TABS
2.5000 mg | ORAL_TABLET | ORAL | Status: DC
  Administered 2018-01-15 – 2018-01-17 (×2): 2.5 mg via ORAL
  Filled 2018-01-13 (×2): qty 1

## 2018-01-13 MED ORDER — IPRATROPIUM-ALBUTEROL 0.5-2.5 (3) MG/3ML IN SOLN
3.0000 mL | Freq: Once | RESPIRATORY_TRACT | Status: AC
Start: 2018-01-13 — End: 2018-01-13
  Administered 2018-01-13: 3 mL via RESPIRATORY_TRACT
  Filled 2018-01-13: qty 3

## 2018-01-13 MED ORDER — OXYCODONE-ACETAMINOPHEN 5-325 MG PO TABS
1.0000 | ORAL_TABLET | ORAL | Status: DC | PRN
  Administered 2018-01-13 – 2018-01-17 (×12): 1 via ORAL
  Administered 2018-01-17: 2 via ORAL
  Administered 2018-01-18: 1 via ORAL
  Administered 2018-01-18: 2 via ORAL
  Filled 2018-01-13 (×4): qty 1
  Filled 2018-01-13: qty 2
  Filled 2018-01-13 (×5): qty 1
  Filled 2018-01-13: qty 2
  Filled 2018-01-13 (×5): qty 1

## 2018-01-13 MED ORDER — ENOXAPARIN SODIUM 40 MG/0.4ML ~~LOC~~ SOLN
40.0000 mg | SUBCUTANEOUS | Status: DC
  Administered 2018-01-13: 40 mg via SUBCUTANEOUS
  Filled 2018-01-13: qty 0.4

## 2018-01-13 MED ORDER — LISINOPRIL 10 MG PO TABS
10.0000 mg | ORAL_TABLET | Freq: Every day | ORAL | Status: DC
  Administered 2018-01-13 – 2018-01-17 (×5): 10 mg via ORAL
  Filled 2018-01-13 (×5): qty 1

## 2018-01-13 MED ORDER — ALBUTEROL SULFATE (2.5 MG/3ML) 0.083% IN NEBU: 2.5000 mg | INHALATION_SOLUTION | RESPIRATORY_TRACT | Status: DC | PRN

## 2018-01-13 MED ORDER — ENOXAPARIN SODIUM 80 MG/0.8ML ~~LOC~~ SOLN
80.0000 mg | SUBCUTANEOUS | Status: DC
  Administered 2018-01-14 – 2018-01-16 (×3): 80 mg via SUBCUTANEOUS
  Filled 2018-01-13 (×3): qty 0.8

## 2018-01-13 MED ORDER — TRAZODONE HCL 100 MG PO TABS
200.0000 mg | ORAL_TABLET | Freq: Every day | ORAL | Status: DC
  Administered 2018-01-13 – 2018-01-17 (×5): 200 mg via ORAL
  Filled 2018-01-13 (×5): qty 2

## 2018-01-13 MED ORDER — FUROSEMIDE 10 MG/ML IJ SOLN
80.0000 mg | Freq: Two times a day (BID) | INTRAMUSCULAR | Status: DC
  Administered 2018-01-13 – 2018-01-16 (×7): 80 mg via INTRAVENOUS
  Filled 2018-01-13 (×7): qty 8

## 2018-01-13 MED ORDER — ACETAMINOPHEN 325 MG PO TABS
650.0000 mg | ORAL_TABLET | Freq: Four times a day (QID) | ORAL | Status: DC | PRN
  Administered 2018-01-13: 650 mg via ORAL
  Filled 2018-01-13: qty 2

## 2018-01-13 MED ORDER — PREDNISONE 20 MG PO TABS
40.0000 mg | ORAL_TABLET | Freq: Every day | ORAL | Status: DC
  Administered 2018-01-13: 40 mg via ORAL
  Filled 2018-01-13: qty 2

## 2018-01-13 MED ORDER — PREDNISONE 20 MG PO TABS
40.0000 mg | ORAL_TABLET | Freq: Every day | ORAL | Status: AC
  Administered 2018-01-14 – 2018-01-17 (×4): 40 mg via ORAL
  Filled 2018-01-13 (×4): qty 2

## 2018-01-13 MED ORDER — ARIPIPRAZOLE 5 MG PO TABS
5.0000 mg | ORAL_TABLET | Freq: Every day | ORAL | Status: DC
  Administered 2018-01-13 – 2018-01-17 (×5): 5 mg via ORAL
  Filled 2018-01-13 (×5): qty 1

## 2018-01-13 MED ORDER — GABAPENTIN 300 MG PO CAPS
300.0000 mg | ORAL_CAPSULE | Freq: Two times a day (BID) | ORAL | Status: DC
  Administered 2018-01-13 – 2018-01-18 (×11): 300 mg via ORAL
  Filled 2018-01-13 (×11): qty 1

## 2018-01-13 MED ORDER — POTASSIUM CHLORIDE CRYS ER 20 MEQ PO TBCR
40.0000 meq | EXTENDED_RELEASE_TABLET | Freq: Once | ORAL | Status: AC
  Administered 2018-01-13: 40 meq via ORAL
  Filled 2018-01-13: qty 2

## 2018-01-13 NOTE — Progress Notes (Signed)
Unable to obtain ABG, another RRT will attempt to collect arterial blood sample will available.

## 2018-01-13 NOTE — Progress Notes (Signed)
[facility]                                                                              Patient Demographics  Lawrence [name], is a 62 y.o. male, DOB - 02-Mar-1956, OHY:[id number]  Admit date - 01/12/2018   Admitting Physician Rise Patience, MD  Outpatient Primary MD for the patient is [place]  Outpatient specialists:   LOS - 0  days   Medical records reviewed and are as summarized below:    Chief Complaint  Patient presents with  . Shortness of Breath       Brief summary   Patient is a a 62 year old male with history of diastolic CHF, hypertension, recently diagnosed diabetes mellitus, obstructive sleep apnea, presented to ED with increasing shortness of breath worse with exertion over the last few days.  Also reported some chest pain radiating to the back with pleuritic component.  Patient also reported increasing pain in the right foot between first and second toes. In ED patient was found to be hypoxic, mildly wheezing.  Patient was given Lasix and nebulizer with some improvement.   Assessment & Plan    Principal Problem:   Acute respiratory failure with hypoxia (HCC) possibly from acute on chronic diastolic CHF with COPD exacerbation, rule out PE -Currently improving, continue diuresis IV Lasix to 80 mg every 12 hours with Zaroxolyn -Follow renal function closely, strict I's and O's and daily weights -D-dimer elevated, obtain CT angiogram of the chest to rule out PE, venous Doppler of the LE -Weight 412 at the time of admission, negative balance of 1.6 L -2D echo 11/2017 showed EF of 60 to 65% with grade 2 diastolic dysfunction, if CTA negative, will repeat 2D echo  Active Problems:      OSA (obstructive sleep apnea) -Continue CPAP    Essential hypertension, benign -BP currently stable    Right foot pain likely acute gout  -Uric acid 11.8, placed on prednisone 40 mg daily for 4 days, colchicine 0.12 mg x1, then continue 0.6 mg  daily until flare resolved. -will place on allopurinol at the time of discharge.  COPD exacerbation -Continue duo nebs 3 times daily, on prednisone    Diabetes mellitus type 2 in obese ([place]) -CBGs expected to rise with prednisone -Continue sliding scale insulin, obtain hemoglobin A1c  Code Status: DNR DVT Prophylaxis:  Lovenox  Family Communication: Discussed in detail with the patient, all imaging results, lab results explained to the patient    Disposition Plan:   Time Spent in minutes  35 minutes  Procedures:    Consultants:   None   Antimicrobials:      Medications  Scheduled Meds: . ARIPiprazole  5 mg Oral QHS  . [START ON 01/14/2018] colchicine  0.6 mg Oral Daily  . enoxaparin (LOVENOX) injection  40 mg Subcutaneous Q24H  . fluticasone  2 spray Each Nare Daily  . furosemide  80 mg Intravenous Q12H  . gabapentin  300 mg Oral BID WC  . gabapentin  600 mg Oral QHS  . insulin aspart  0-9 Units Subcutaneous TID WC  . ipratropium-albuterol  3 mL Nebulization  TID  . lisinopril  10 mg Oral Daily  . [START ON 01/15/2018] metolazone  2.5 mg Oral Q M,W,F  . potassium chloride  20 mEq Oral Daily  . [START ON 01/14/2018] predniSONE  40 mg Oral Q breakfast  . temazepam  15 mg Oral QHS  . traZODone  200 mg Oral QHS   Continuous Infusions: PRN Meds:.acetaminophen **OR** acetaminophen, albuterol, ondansetron **OR** ondansetron (ZOFRAN) IV, oxyCODONE-acetaminophen   Antibiotics   Anti-infectives (From admission, onward)   None        Subjective:   Adolf Ormiston was seen and examined today.  Complaining of pain in the right foot, shortness of breath is improving, no fevers or chills.  No coughing. Patient denies dizziness,  abdominal pain, N/V/D/C, new weakness, numbess, tingling.   Objective:   Vitals:   01/13/18 0115 01/13/18 0240 01/13/18 0847 01/13/18 0905  BP: 133/68 103/65 124/60   Pulse: 90 87 87   Resp:  16    Temp:  98.6 F (37 C)    TempSrc:  Oral     SpO2: 98% 96% 94% 99%  Weight:  (!) 187.3 kg (412 lb 14.8 oz)    Height:        Intake/Output Summary (Last 24 hours) at 01/13/2018 1028 Last data filed at 01/13/2018 0948 Gross per 24 hour  Intake 480 ml  Output 2150 ml  Net -1670 ml     Wt Readings from Last 3 Encounters:  01/13/18 (!) 187.3 kg (412 lb 14.8 oz)  11/07/17 (!) 193.5 kg (426 lb 11.2 oz)  03/07/17 (!) 180.7 kg (398 lb 6.4 oz)     Exam  General: Alert and oriented x 3, NAD  Eyes:   HEENT:  Atraumatic, normocephalic, normal oropharynx  Cardiovascular: S1 S2 auscultated, Regular rate and rhythm.  Respiratory: Decreased breath sound at the bases  Gastrointestinal: Soft, nontender, nondistended, + bowel sounds  Ext: 1+ pedal edema bilaterally, with chronic venous stasis changes, erythema on bilateral lower extremities  Neuro: no new deficits  Musculoskeletal: No digital cyanosis, clubbing  Skin: No rashes  Psych: Normal affect and demeanor, alert and oriented x3    Data Reviewed:  I have personally reviewed following labs and imaging studies  Micro Results No results found for this or any previous visit (from the past 240 hour(s)).  Radiology Reports Dg Chest 2 View  Result Date: 01/12/2018 CLINICAL DATA:  Initial evaluation for acute shortness of breath. History of CHF and COPD. EXAM: CHEST - 2 VIEW COMPARISON:  Prior radiograph from 01/31/2017. FINDINGS: Stable cardiomegaly. Mediastinal silhouette normal. Aortic atherosclerosis. Lungs normally inflated. Chronic bronchitic changes, consistent with history of COPD. No consolidative airspace opacity to suggest pneumonia. No pulmonary edema or pleural effusion. No pneumothorax. No acute osseous abnormality. Degenerative changes noted throughout the visualized spine. IMPRESSION: 1. Chronic changes related to COPD. No focal infiltrates or other active cardiopulmonary disease. 2. Stable cardiomegaly without pulmonary edema. Electronically Signed   By: Jeannine Boga M.D.   On: 01/12/2018 22:33    Lab Data:  CBC: Recent Labs  Lab 01/12/18 2242 01/12/18 2246 01/13/18 0519  WBC 12.7*  --  11.8*  NEUTROABS  --   --  8.7*  HGB 13.0 13.6 12.9*  HCT 39.5 40.0 39.8  MCV 86.6  --  88.1  PLT 323  --  202   Basic Metabolic Panel: Recent Labs  Lab 01/12/18 2227 01/12/18 2246 01/13/18 0519  NA 131* 132* 132*  K 3.5 3.3* 3.4*  CL  89* 89* 89*  CO2 28  --  30  GLUCOSE 107* 116* 138*  BUN 25* 26* 24*  CREATININE 1.03 1.00 1.07  CALCIUM 8.9  --  8.8*  MG  --   --  1.7   GFR: Estimated Creatinine Clearance: 127.6 mL/min (by C-G formula based on SCr of 1.07 mg/dL). Liver Function Tests: Recent Labs  Lab 01/13/18 0519  AST 15  ALT 21  ALKPHOS 59  BILITOT 0.8  PROT 6.4*  ALBUMIN 3.4*   No results for input(s): LIPASE, AMYLASE in the last 168 hours. No results for input(s): AMMONIA in the last 168 hours. Coagulation Profile: No results for input(s): INR, PROTIME in the last 168 hours. Cardiac Enzymes: Recent Labs  Lab 01/13/18 0519 01/13/18 0646  TROPONINI <0.03 <0.03   BNP (last 3 results) No results for input(s): PROBNP in the last 8760 hours. HbA1C: No results for input(s): HGBA1C in the last 72 hours. CBG: Recent Labs  Lab 01/13/18 0247 01/13/18 0757  GLUCAP 127* 148*   Lipid Profile: No results for input(s): CHOL, HDL, LDLCALC, TRIG, CHOLHDL, LDLDIRECT in the last 72 hours. Thyroid Function Tests: Recent Labs    01/13/18 0519  TSH 1.691   Anemia Panel: No results for input(s): VITAMINB12, FOLATE, FERRITIN, TIBC, IRON, RETICCTPCT in the last 72 hours. Urine analysis:    Component Value Date/Time   COLORURINE RED (A) 02/06/2017 1215   APPEARANCEUR CLOUDY (A) 02/06/2017 1215   LABSPEC  02/06/2017 1215    TEST NOT REPORTED DUE TO COLOR INTERFERENCE OF URINE PIGMENT   PHURINE  02/06/2017 1215    TEST NOT REPORTED DUE TO COLOR INTERFERENCE OF URINE PIGMENT   GLUCOSEU (A) 02/06/2017 1215    TEST NOT  REPORTED DUE TO COLOR INTERFERENCE OF URINE PIGMENT   HGBUR (A) 02/06/2017 1215    TEST NOT REPORTED DUE TO COLOR INTERFERENCE OF URINE PIGMENT   BILIRUBINUR (A) 02/06/2017 1215    TEST NOT REPORTED DUE TO COLOR INTERFERENCE OF URINE PIGMENT   KETONESUR (A) 02/06/2017 1215    TEST NOT REPORTED DUE TO COLOR INTERFERENCE OF URINE PIGMENT   PROTEINUR (A) 02/06/2017 1215    TEST NOT REPORTED DUE TO COLOR INTERFERENCE OF URINE PIGMENT   NITRITE (A) 02/06/2017 1215    TEST NOT REPORTED DUE TO COLOR INTERFERENCE OF URINE PIGMENT   LEUKOCYTESUR (A) 02/06/2017 1215    TEST NOT REPORTED DUE TO COLOR INTERFERENCE OF URINE PIGMENT     Erasmus Bistline M.D. [facility] 01/13/2018, 10:28 AM  Pager: [phone] Between 7am to 7pm - call Pager - [phone]  After 7pm go to [email or web address] - password [email or web address]  Call night coverage person covering after 7pm

## 2018-01-13 NOTE — Progress Notes (Addendum)
Patient not feeling well soon after arriving back on unit from CT angio.  Feels fuzzy headed.  Burning pain in Right foot.  Otherwise is not SOB & not other complaints of pain.  Vital sign taken.   Doctor paged.

## 2018-01-13 NOTE — H&P (Signed)
History and Physical    Jaxston Chohan TFT:[id number] DOB: August 06, 1956 DOA: 01/12/2018  PCP: [place]  Patient coming from: Home.  Chief Complaint: Shortness of breath.  HPI: Lawrence [name] is a 62 y.o. male with history of diastolic CHF, hypertension, recently diagnosed diabetes mellitus, sleep apnea, presents to the ER with complaint of increasing shortness of breath over the last few days.  Shortness of breath increased on exertion.  Has been having some chest pain radiating to his back at times having the pleuritic component.  Has also been a increasing pain in the right foot mostly in the webspace between the first and second toes.  ED Course: In the ER patient is found to be hypoxic and initially mildly wheezing.  Chest x-ray does not show anything acute.  Patient was given Lasix and nebulizer treatment for possible COPD versus CHF treatment.  Patient admitted for acute respiratory failure with hypoxia.    Review of Systems: As per HPI, rest all negative.   Past Medical History:  Diagnosis Date  . Anxiety   . Arthritis   . CHF (congestive heart failure) ([place])   . Chronic cough   . Depression   . Hypertension   . New onset type 2 diabetes mellitus ([place]) 11/08/2017  . PTSD (post-traumatic stress disorder)   . Sleep apnea     Past Surgical History:  Procedure Laterality Date  . HERNIA REPAIR    . JOINT REPLACEMENT     left knee replacement x 2  . KNEE ARTHROSCOPY       reports that he quit smoking about 22 months ago. He has never used smokeless tobacco. He reports that he drinks alcohol. He reports that he does not use drugs.  Allergies  Allergen Reactions  . Tubersol [Tuberculin] Other (See Comments)    Reaction unknown  . Vancomycin Other (See Comments)    Red Man Syndrome  . Doxycycline Rash    Family History  Problem Relation Age of Onset  . CAD Maternal Grandfather   . Diabetes Mellitus II Neg Hx     Prior to Admission medications   Medication  Sig Start Date End Date Taking? Authorizing Provider  acetaminophen (TYLENOL) 500 MG tablet Take 500-1,000 mg by mouth every 4 (four) hours.    Yes [provider]  ARIPiprazole (ABILIFY) 5 MG tablet Take 5 mg by mouth at bedtime.    Yes [provider]  blood glucose meter kit and supplies Dispense based on patient and insurance preference. Use up to four times daily as directed. (FOR ICD-10 E10.9, E11.9). 11/09/17  Yes Eugenie Filler, MD  cetirizine (ZYRTEC) 10 MG tablet Take 10 mg by mouth daily.   Yes [provider]  fluticasone (FLONASE) 50 MCG/ACT nasal spray Place into both nostrils daily.   Yes [provider]  furosemide (LASIX) 80 MG tablet Take 80 mg by mouth 2 (two) times daily.   Yes [provider]  gabapentin (NEURONTIN) 300 MG capsule Take 300-600 mg by mouth 3 (three) times daily. 300 mg IN THE am, at noon and 600 mg at bedtime   Yes [provider]  lisinopril (PRINIVIL,ZESTRIL) 10 MG tablet Take 10 mg by mouth daily.   Yes [provider]  loperamide (IMODIUM) 2 MG capsule Take 2 mg by mouth as needed for diarrhea or loose stools.   Yes [provider]  metFORMIN (GLUCOPHAGE) 500 MG tablet Take 1 tablet (500 mg total) by mouth 2 (two) times daily  with a meal. Patient taking differently: Take 500 mg by mouth See admin instructions. Once daily till 01/16/18 then take 1 tablet twice daily. 11/09/17 11/09/18 Yes Eugenie Filler, MD  metolazone (ZAROXOLYN) 2.5 MG tablet Take 2.5 mg by mouth. Take on Mondays, Wednesdays and Fridays   Yes [provider]  temazepam (RESTORIL) 7.5 MG capsule Take 15 mg by mouth at bedtime.    Yes [provider]  traZODone (DESYREL) 100 MG tablet Take 200 mg by mouth at bedtime.   Yes [provider]  TRIAMCINOLONE ACETONIDE,NASAL, NA Place 2 sprays into the nose every evening.    Yes [provider]  Trolamine Salicylate (ASPERCREME EX) Apply 1  application topically 2 (two) times daily as needed (pain and inflammation).   Yes [provider]    Physical Exam: Vitals:   01/13/18 0009 01/13/18 0045 01/13/18 0115 01/13/18 0240  BP:  105/67 133/68 103/65  Pulse:  88 90 87  Resp:    16  Temp:    98.6 F (37 C)  TempSrc:    Oral  SpO2:  92% 98% 96%  Weight: (!) 176.9 kg (390 lb)   (!) 187.3 kg (412 lb 14.8 oz)  Height: _0  (1.854 m)         Constitutional: Moderately built and nourished. Vitals:   01/13/18 0009 01/13/18 0045 01/13/18 0115 01/13/18 0240  BP:  105/67 133/68 103/65  Pulse:  88 90 87  Resp:    16  Temp:    98.6 F (37 C)  TempSrc:    Oral  SpO2:  92% 98% 96%  Weight: (!) 176.9 kg (390 lb)   (!) 187.3 kg (412 lb 14.8 oz)  Height: _1  (1.854 m)      Eyes: Anicteric no pallor. ENMT: No discharge from the ears eyes nose or mouth. Neck: JVD elevated no mass felt. Respiratory: Mild wheezing with minimal crepitations. Cardiovascular: S1-S2 heard no murmurs appreciated. Abdomen: Distended nontender bowel sounds present. Musculoskeletal: Mild edema of the both lower extremity.  Chronic skin changes in the lower extremities.  No obvious joint swelling in the toes. Skin: Chronic skin changes of the lower extremity. Neurologic: Alert awake oriented to time place and person.  Moves all extremities. Psychiatric: Appears normal.  Normal affect.   Labs on Admission: I have personally reviewed following labs and imaging studies  CBC: Recent Labs  Lab 01/12/18 2242 01/12/18 2246  WBC 12.7*  --   HGB 13.0 13.6  HCT 39.5 40.0  MCV 86.6  --   PLT 323  --    Basic Metabolic Panel: Recent Labs  Lab 01/12/18 2227 01/12/18 2246  NA 131* 132*  K 3.5 3.3*  CL 89* 89*  CO2 28  --   GLUCOSE 107* 116*  BUN 25* 26*  CREATININE 1.03 1.00  CALCIUM 8.9  --    GFR: Estimated Creatinine Clearance: 136.6 mL/min (by C-G formula based on SCr of 1 mg/dL). Liver Function Tests: No results for input(s):  AST, ALT, ALKPHOS, BILITOT, PROT, ALBUMIN in the last 168 hours. No results for input(s): LIPASE, AMYLASE in the last 168 hours. No results for input(s): AMMONIA in the last 168 hours. Coagulation Profile: No results for input(s): INR, PROTIME in the last 168 hours. Cardiac Enzymes: No results for input(s): CKTOTAL, CKMB, CKMBINDEX, TROPONINI in the last 168 hours. BNP (last 3 results) No results for input(s): PROBNP in the last 8760 hours. HbA1C: No results for input(s): HGBA1C in the last  72 hours. CBG: Recent Labs  Lab 01/13/18 0247  GLUCAP 127*   Lipid Profile: No results for input(s): CHOL, HDL, LDLCALC, TRIG, CHOLHDL, LDLDIRECT in the last 72 hours. Thyroid Function Tests: No results for input(s): TSH, T4TOTAL, FREET4, T3FREE, THYROIDAB in the last 72 hours. Anemia Panel: No results for input(s): VITAMINB12, FOLATE, FERRITIN, TIBC, IRON, RETICCTPCT in the last 72 hours. Urine analysis:    Component Value Date/Time   COLORURINE RED (A) 02/06/2017 1215   APPEARANCEUR CLOUDY (A) 02/06/2017 1215   LABSPEC  02/06/2017 1215    TEST NOT REPORTED DUE TO COLOR INTERFERENCE OF URINE PIGMENT   PHURINE  02/06/2017 1215    TEST NOT REPORTED DUE TO COLOR INTERFERENCE OF URINE PIGMENT   GLUCOSEU (A) 02/06/2017 1215    TEST NOT REPORTED DUE TO COLOR INTERFERENCE OF URINE PIGMENT   HGBUR (A) 02/06/2017 1215    TEST NOT REPORTED DUE TO COLOR INTERFERENCE OF URINE PIGMENT   BILIRUBINUR (A) 02/06/2017 1215    TEST NOT REPORTED DUE TO COLOR INTERFERENCE OF URINE PIGMENT   KETONESUR (A) 02/06/2017 1215    TEST NOT REPORTED DUE TO COLOR INTERFERENCE OF URINE PIGMENT   PROTEINUR (A) 02/06/2017 1215    TEST NOT REPORTED DUE TO COLOR INTERFERENCE OF URINE PIGMENT   NITRITE (A) 02/06/2017 1215    TEST NOT REPORTED DUE TO COLOR INTERFERENCE OF URINE PIGMENT   LEUKOCYTESUR (A) 02/06/2017 1215    TEST NOT REPORTED DUE TO COLOR INTERFERENCE OF URINE PIGMENT   Sepsis  Labs: _0 (procalcitonin:4,lacticidven:4) )No results found for this or any previous visit (from the past 240 hour(s)).   Radiological Exams on Admission: Dg Chest 2 View  Result Date: 01/12/2018 CLINICAL DATA:  Initial evaluation for acute shortness of breath. History of CHF and COPD. EXAM: CHEST - 2 VIEW COMPARISON:  Prior radiograph from 01/31/2017. FINDINGS: Stable cardiomegaly. Mediastinal silhouette normal. Aortic atherosclerosis. Lungs normally inflated. Chronic bronchitic changes, consistent with history of COPD. No consolidative airspace opacity to suggest pneumonia. No pulmonary edema or pleural effusion. No pneumothorax. No acute osseous abnormality. Degenerative changes noted throughout the visualized spine. IMPRESSION: 1. Chronic changes related to COPD. No focal infiltrates or other active cardiopulmonary disease. 2. Stable cardiomegaly without pulmonary edema. Electronically Signed   By: Jeannine Boga M.D.   On: 01/12/2018 22:33    EKG: Independently reviewed.  Normal sinus rhythm.  Assessment/Plan Active Problems:   OSA (obstructive sleep apnea)   Essential hypertension, benign   Acute respiratory failure with hypoxia (HCC)   Right foot pain   Acute on chronic diastolic CHF (congestive heart failure) ([place])   Diabetes mellitus type 2 in obese ([place])    1. Acute respiratory failure with hypoxia -likely from decompensated CHF last EF measured in April 2019 was 60 to 65% with grade 2 diastolic dysfunction.  Possibility of COPD also.  At this time I have placed patient on Lasix 80 mg IV every 12 patient takes same dose at home and tablet form.  Along with continuation of Zaroxolyn.  Closely follow intake output metabolic panel.  Since patient also had some chest pain off and on will check d-dimer.  For for possible COPD will continue with nebulizer treatment. 2. Right foot pain -we will check Dopplers and uric acid level.  No definite signs of infection closely observe  for now.  Was admitted for cellulitis of the lower extremity recently. 3. Hypertension on lisinopril. 4. Diabetes mellitus type 2 -we will keep patient on sliding scale coverage  and closely follow CBGs. 5. Sleep apnea on CPAP. 6. Morbid obesity.   DVT prophylaxis: Lovenox. Code Status: DNR. Family Communication: Discussed with patient. Disposition Plan: Home. Consults called: None. Admission status: Inpatient.   Rise Patience MD [facility] Pager [phone].  If 7PM-7AM, please contact night-coverage [email or web address] Password [facility]  01/13/2018, 4:58 AM

## 2018-01-13 NOTE — Progress Notes (Signed)
Seen and examined by Dr. Kakrakandy. 

## 2018-01-14 ENCOUNTER — Other Ambulatory Visit (HOSPITAL_COMMUNITY): Payer: Non-veteran care

## 2018-01-14 ENCOUNTER — Encounter (HOSPITAL_COMMUNITY): Payer: Non-veteran care

## 2018-01-14 ENCOUNTER — Inpatient Hospital Stay (HOSPITAL_COMMUNITY): Payer: No Typology Code available for payment source

## 2018-01-14 LAB — GLUCOSE, CAPILLARY
Glucose-Capillary: 156 mg/dL — ABNORMAL HIGH (ref 65–99)
Glucose-Capillary: 189 mg/dL — ABNORMAL HIGH (ref 65–99)
Glucose-Capillary: 175 mg/dL — ABNORMAL HIGH (ref 65–99)
Glucose-Capillary: 153 mg/dL — ABNORMAL HIGH (ref 65–99)

## 2018-01-14 LAB — HEMOGLOBIN A1C
Hgb A1c MFr Bld: 8.2 % — ABNORMAL HIGH (ref 4.8–5.6)
Mean Plasma Glucose: 188.64 mg/dL

## 2018-01-14 MED ORDER — SALINE SPRAY 0.65 % NA SOLN
1.0000 | NASAL | Status: DC | PRN
  Filled 2018-01-14: qty 44

## 2018-01-14 MED ORDER — INSULIN ASPART 100 UNIT/ML ~~LOC~~ SOLN
3.0000 [IU] | Freq: Three times a day (TID) | SUBCUTANEOUS | Status: DC
  Administered 2018-01-14 – 2018-01-18 (×9): 3 [IU] via SUBCUTANEOUS

## 2018-01-14 NOTE — Progress Notes (Signed)
Patient having a lot of head congestion.  Humidifierin place,  giving flonase; ordered saline nasal spray  and paged doctor for any additional orders.

## 2018-01-14 NOTE — Progress Notes (Signed)
[facility]                                                                              Patient Demographics  Lawrence [name], is a 62 y.o. male, DOB - 1956/05/23, WUJ:[id number]  Admit date - 01/12/2018   Admitting Physician Rise Patience, MD  Outpatient Primary MD for the patient is [place]  Outpatient specialists:   LOS - 1  days   Medical records reviewed and are as summarized below:    Chief Complaint  Patient presents with  . Shortness of Breath       Brief summary   Patient is a a 62 year old male with history of diastolic CHF, hypertension, recently diagnosed diabetes mellitus, obstructive sleep apnea, presented to ED with increasing shortness of breath worse with exertion over the last few days.  Also reported some chest pain radiating to the back with pleuritic component.  Patient also reported increasing pain in the right foot between first and second toes. In ED patient was found to be hypoxic, mildly wheezing.  Patient was given Lasix and nebulizer with some improvement.   Assessment & Plan    Principal Problem:   Acute respiratory failure with hypoxia (HCC) possibly from acute on chronic diastolic CHF with COPD exacerbation -Currently improving, continue diuresis IV Lasix to 80 mg every 12 hours with Zaroxolyn -Follow renal function closely, strict I's and O's and daily weights -D-dimer elevated, CT angiogram showed no acute PE.  Follow venous Doppler of the lower extremities.  -Weight 412 at the time of admission, improved to 406 today, negative balance of 4.6 L -Patient reported had some shortness of breath today.  Chest x-ray showed spaces stable chest with no acute findings, cardiomegaly with mild bibasilar atelectasis.  Repeat 2D echo   Active Problems:      OSA (obstructive sleep apnea) -Continue CPAP    Essential hypertension, benign -BP stable    Right foot pain likely acute gout  -Feeling better today -Uric  acid 11.8, placed on prednisone 40 mg daily for 4 days, colchicine 0.12 mg x1, then continue 0.6 mg daily until flare resolved. -will place on allopurinol at the time of discharge.  COPD exacerbation -Wheezing improved, continue duo nebs 3 times daily, prednisone     Diabetes mellitus type 2 in obese ([place]), uncontrolled with hyperglycemia -CBGs expected to rise with prednisone -Placed on meal coverage, continue sliding scale insulin, hemoglobin A1c 8.2  Code Status: DNR DVT Prophylaxis:  Lovenox  Family Communication: Discussed in detail with the patient, all imaging results, lab results explained to the patient    Disposition Plan: Possible DC in a.m.  Time Spent in minutes 25 minutes  Procedures:    Consultants:   None   Antimicrobials:      Medications  Scheduled Meds: . ARIPiprazole  5 mg Oral QHS  . colchicine  0.6 mg Oral Daily  . enoxaparin (LOVENOX) injection  80 mg Subcutaneous Q24H  . fluticasone  2 spray Each Nare Daily  . furosemide  80 mg Intravenous Q12H  . gabapentin  300 mg Oral BID WC  . gabapentin  600 mg  Oral QHS  . insulin aspart  0-9 Units Subcutaneous TID WC  . ipratropium-albuterol  3 mL Nebulization TID  . lisinopril  10 mg Oral Daily  . [START ON 01/15/2018] metolazone  2.5 mg Oral Q M,W,F  . potassium chloride  20 mEq Oral Daily  . predniSONE  40 mg Oral Q breakfast  . temazepam  15 mg Oral QHS  . traZODone  200 mg Oral QHS   Continuous Infusions: PRN Meds:.acetaminophen **OR** acetaminophen, albuterol, ondansetron **OR** ondansetron (ZOFRAN) IV, oxyCODONE-acetaminophen   Antibiotics   Anti-infectives (From admission, onward)   None        Subjective:   Lawrence [name] was seen and examined today.  States felt little bit winded this morning otherwise no chest pain, nausea, vomiting or any fevers or chills.  No coughing.  Patient denies dizziness,  abdominal pain, N/V/D/C, new weakness, numbess, tingling.   Objective:    Vitals:   01/14/18 0443 01/14/18 0649 01/14/18 0700 01/14/18 0806  BP: (!) 109/53  118/69 117/70  Pulse: 75  82 78  Resp: 18  18   Temp: 98.5 F (36.9 C)  98.4 F (36.9 C)   TempSrc: Oral  Oral   SpO2: 94% 94% 94% 97%  Weight: (!) 184.3 kg (406 lb 4.9 oz)     Height:        Intake/Output Summary (Last 24 hours) at 01/14/2018 1046 Last data filed at 01/14/2018 1004 Gross per 24 hour  Intake 2040 ml  Output 4050 ml  Net -2010 ml     Wt Readings from Last 3 Encounters:  01/14/18 (!) 184.3 kg (406 lb 4.9 oz)  11/07/17 (!) 193.5 kg (426 lb 11.2 oz)  03/07/17 (!) 180.7 kg (398 lb 6.4 oz)     Exam   General: Alert and oriented x 3, NAD  Eyes:   HEENT:    Cardiovascular: S1 S2 clear, RRR. Regular rate and rhythm. 1+ pedal edema b/l  Respiratory: Decreased breath sound at the bases  Gastrointestinal: Soft, nontender, nondistended, + bowel sounds  Ext: 1+ edema with chronic venous stasis changes  Neuro: no new deficit  Musculoskeletal: No digital cyanosis, clubbing  Skin: No rashes  Psych: Normal affect and demeanor, alert and oriented x3    Data Reviewed:  I have personally reviewed following labs and imaging studies  Micro Results No results found for this or any previous visit (from the past 240 hour(s)).  Radiology Reports Dg Chest 2 View  Result Date: 01/14/2018 CLINICAL DATA:  Dyspnea for 1 week. History of diabetes, hypertension and congestive heart failure. EXAM: CHEST - 2 VIEW COMPARISON:  Chest radiograph 01/12/2018.  CT 01/13/2018. FINDINGS: There is stable cardiac enlargement. The mediastinal contours are stable. Mild central airway thickening and atelectasis at both bases are stable. There is no edema, confluent airspace opacity, pleural effusion or pneumothorax. Mild degenerative changes are present throughout the thoracic spine. IMPRESSION: Stable chest without acute findings. Cardiomegaly and mild bibasilar atelectasis. Electronically Signed   By:  Richardean Sale M.D.   On: 01/14/2018 09:17   Dg Chest 2 View  Result Date: 01/12/2018 CLINICAL DATA:  Initial evaluation for acute shortness of breath. History of CHF and COPD. EXAM: CHEST - 2 VIEW COMPARISON:  Prior radiograph from 01/31/2017. FINDINGS: Stable cardiomegaly. Mediastinal silhouette normal. Aortic atherosclerosis. Lungs normally inflated. Chronic bronchitic changes, consistent with history of COPD. No consolidative airspace opacity to suggest pneumonia. No pulmonary edema or pleural effusion. No pneumothorax. No acute osseous abnormality. Degenerative changes  noted throughout the visualized spine. IMPRESSION: 1. Chronic changes related to COPD. No focal infiltrates or other active cardiopulmonary disease. 2. Stable cardiomegaly without pulmonary edema. Electronically Signed   By: Jeannine Boga M.D.   On: 01/12/2018 22:33   Ct Angio Chest Pe W Or Wo Contrast  Result Date: 01/13/2018 CLINICAL DATA:  Acute respiratory failure with hypoxia. CHF. Elevated D-dimer. EXAM: CT ANGIOGRAPHY CHEST WITH CONTRAST TECHNIQUE: Multidetector CT imaging of the chest was performed using the standard protocol during bolus administration of intravenous contrast. Multiplanar CT image reconstructions and MIPs were obtained to evaluate the vascular anatomy. CONTRAST:  135mL ISOVUE-370 IOPAMIDOL (ISOVUE-370) INJECTION 76% COMPARISON:  Chest radiograph from one day prior. FINDINGS: Cardiovascular: The study is moderate quality for the evaluation of pulmonary embolism, limited by suboptimal contrast opacification of the pulmonary arterial tree. There are no filling defects in the central, lobar, segmental or subsegmental pulmonary artery branches to suggest acute pulmonary embolism. Atherosclerotic nonaneurysmal thoracic aorta. Normal caliber pulmonary arteries. Mild cardiomegaly. No significant pericardial fluid/thickening. Three-vessel coronary atherosclerosis. Mediastinum/Nodes: No discrete thyroid nodules.  Unremarkable esophagus. No pathologically enlarged axillary, mediastinal or hilar lymph nodes. Lungs/Pleura: No pneumothorax. No pleural effusion. No acute consolidative airspace disease, lung masses or significant pulmonary nodules. Hypoventilatory changes in the dependent lungs. Upper abdomen: Simple liver cysts, largest 3.0 cm in the anterior liver. Musculoskeletal: No aggressive appearing focal osseous lesions. Moderate thoracic spondylosis. Review of the MIP images confirms the above findings. IMPRESSION: 1. No evidence of pulmonary embolism. 2. Hypoventilatory changes in the lungs, with otherwise no active pulmonary disease. 3. Mild cardiomegaly.  Three-vessel coronary atherosclerosis. Aortic Atherosclerosis (ICD10-I70.0). Electronically Signed   By: Ilona Sorrel M.D.   On: 01/13/2018 14:32    Lab Data:  CBC: Recent Labs  Lab 01/12/18 2242 01/12/18 2246 01/13/18 0519  WBC 12.7*  --  11.8*  NEUTROABS  --   --  8.7*  HGB 13.0 13.6 12.9*  HCT 39.5 40.0 39.8  MCV 86.6  --  88.1  PLT 323  --  010   Basic Metabolic Panel: Recent Labs  Lab 01/12/18 2227 01/12/18 2246 01/13/18 0519  NA 131* 132* 132*  K 3.5 3.3* 3.4*  CL 89* 89* 89*  CO2 28  --  30  GLUCOSE 107* 116* 138*  BUN 25* 26* 24*  CREATININE 1.03 1.00 1.07  CALCIUM 8.9  --  8.8*  MG  --   --  1.7   GFR: Estimated Creatinine Clearance: 126.4 mL/min (by C-G formula based on SCr of 1.07 mg/dL). Liver Function Tests: Recent Labs  Lab 01/13/18 0519  AST 15  ALT 21  ALKPHOS 59  BILITOT 0.8  PROT 6.4*  ALBUMIN 3.4*   No results for input(s): LIPASE, AMYLASE in the last 168 hours. No results for input(s): AMMONIA in the last 168 hours. Coagulation Profile: No results for input(s): INR, PROTIME in the last 168 hours. Cardiac Enzymes: Recent Labs  Lab 01/13/18 0519 01/13/18 0646 01/13/18 1210 01/13/18 1900  TROPONINI <0.03 <0.03 <0.03 <0.03   BNP (last 3 results) No results for input(s): PROBNP in the last  8760 hours. HbA1C: Recent Labs    01/14/18 0621  HGBA1C 8.2*   CBG: Recent Labs  Lab 01/13/18 0757 01/13/18 1129 01/13/18 1623 01/13/18 2135 01/14/18 0735  GLUCAP 148* 191* 207* 142* 156*   Lipid Profile: No results for input(s): CHOL, HDL, LDLCALC, TRIG, CHOLHDL, LDLDIRECT in the last 72 hours. Thyroid Function Tests: Recent Labs    01/13/18 0519  TSH 1.691   Anemia Panel: No results for input(s): VITAMINB12, FOLATE, FERRITIN, TIBC, IRON, RETICCTPCT in the last 72 hours. Urine analysis:    Component Value Date/Time   COLORURINE RED (A) 02/06/2017 1215   APPEARANCEUR CLOUDY (A) 02/06/2017 1215   LABSPEC  02/06/2017 1215    TEST NOT REPORTED DUE TO COLOR INTERFERENCE OF URINE PIGMENT   PHURINE  02/06/2017 1215    TEST NOT REPORTED DUE TO COLOR INTERFERENCE OF URINE PIGMENT   GLUCOSEU (A) 02/06/2017 1215    TEST NOT REPORTED DUE TO COLOR INTERFERENCE OF URINE PIGMENT   HGBUR (A) 02/06/2017 1215    TEST NOT REPORTED DUE TO COLOR INTERFERENCE OF URINE PIGMENT   BILIRUBINUR (A) 02/06/2017 1215    TEST NOT REPORTED DUE TO COLOR INTERFERENCE OF URINE PIGMENT   KETONESUR (A) 02/06/2017 1215    TEST NOT REPORTED DUE TO COLOR INTERFERENCE OF URINE PIGMENT   PROTEINUR (A) 02/06/2017 1215    TEST NOT REPORTED DUE TO COLOR INTERFERENCE OF URINE PIGMENT   NITRITE (A) 02/06/2017 1215    TEST NOT REPORTED DUE TO COLOR INTERFERENCE OF URINE PIGMENT   LEUKOCYTESUR (A) 02/06/2017 1215    TEST NOT REPORTED DUE TO COLOR INTERFERENCE OF URINE PIGMENT     Arantza Darrington M.D. [facility] 01/14/2018, 10:46 AM  Pager: [phone] Between 7am to 7pm - call Pager - [phone]  After 7pm go to [email or web address] - password [email or web address]  Call night coverage person covering after 7pm

## 2018-01-14 NOTE — Progress Notes (Signed)
Patient request a Yellow DNR form.  Has the advanced directives filled out but not a Yellow form.   Main attending was notified today.

## 2018-01-15 ENCOUNTER — Inpatient Hospital Stay (HOSPITAL_COMMUNITY): Payer: No Typology Code available for payment source

## 2018-01-15 ENCOUNTER — Other Ambulatory Visit (HOSPITAL_COMMUNITY): Payer: Non-veteran care

## 2018-01-15 DIAGNOSIS — J9601 Acute respiratory failure with hypoxia: Secondary | ICD-10-CM

## 2018-01-15 DIAGNOSIS — G4733 Obstructive sleep apnea (adult) (pediatric): Secondary | ICD-10-CM

## 2018-01-15 DIAGNOSIS — I5033 Acute on chronic diastolic (congestive) heart failure: Secondary | ICD-10-CM

## 2018-01-15 DIAGNOSIS — I509 Heart failure, unspecified: Secondary | ICD-10-CM

## 2018-01-15 DIAGNOSIS — R0602 Shortness of breath: Secondary | ICD-10-CM

## 2018-01-15 DIAGNOSIS — R609 Edema, unspecified: Secondary | ICD-10-CM

## 2018-01-15 DIAGNOSIS — I10 Essential (primary) hypertension: Secondary | ICD-10-CM

## 2018-01-15 LAB — GLUCOSE, CAPILLARY
Glucose-Capillary: 132 mg/dL — ABNORMAL HIGH (ref 65–99)
Glucose-Capillary: 210 mg/dL — ABNORMAL HIGH (ref 65–99)
Glucose-Capillary: 259 mg/dL — ABNORMAL HIGH (ref 65–99)
Glucose-Capillary: 149 mg/dL — ABNORMAL HIGH (ref 65–99)

## 2018-01-15 LAB — ECHOCARDIOGRAM COMPLETE
Height: 73 in
Weight: 6483.2 oz

## 2018-01-15 MED ORDER — ASPIRIN EC 81 MG PO TBEC
81.0000 mg | DELAYED_RELEASE_TABLET | Freq: Every day | ORAL | Status: DC
  Administered 2018-01-15 – 2018-01-18 (×3): 81 mg via ORAL
  Filled 2018-01-15 (×3): qty 1

## 2018-01-15 NOTE — Progress Notes (Signed)
  Echocardiogram 2D Echocardiogram has been performed.  Jennette Dubin 01/15/2018, 8:45 AM

## 2018-01-15 NOTE — Consult Note (Signed)
Cardiology Consult    Patient ID: Lawrence [name] MRN: [id number], DOB/AGE: 61-Apr-1957   Admit date: 01/12/2018 Date of Consult: 01/15/2018  Primary Physician: [place] Primary Cardiologist: New  Requesting Provider: Dr. Tana Coast Reason for Consultation: CHF  Lawrence [name] is a 62 y.o. male who is being seen today for the evaluation of CHF at the request of Dr. Tana Coast.   Patient Profile    62 yo male with PMH of diastolic HF, HTN, DM, OSA, and PTSD who presented with worsening shortness of breath.   Past Medical History   Past Medical History:  Diagnosis Date  . Anxiety   . Arthritis   . CHF (congestive heart failure) ([place])   . Chronic cough   . Depression   . Hypertension   . New onset type 2 diabetes mellitus ([place]) 11/08/2017  . PTSD (post-traumatic stress disorder)   . Sleep apnea     Past Surgical History:  Procedure Laterality Date  . HERNIA REPAIR    . JOINT REPLACEMENT     left knee replacement x 2  . KNEE ARTHROSCOPY       Allergies  Allergies  Allergen Reactions  . Tubersol [Tuberculin] Other (See Comments)    Reaction unknown  . Vancomycin Other (See Comments)    Red Man Syndrome  . Doxycycline Rash    History of Present Illness    Lawrence [name] is a 62 yo male with PMH of diastolic HF, HTN, DM, OSA, morbid obesity and PTSD. He is followed through the [place] for his primary care. Reports seeing a cardiologist in the past at the [place] but told he didn't have HF and no longer needed to follow up. He does take 80mg  of lasix BID along with metolazone 2.5mg  MWF. States no recent changes in his diet or fluid intake. He is not active. Over the past week has been feeling "under the weather" and progressively short of breath with increased LE edema. Unsure whether his weight was increasing.   Presented to the ED on 01/13/18 with symptoms and found to be hypoxic. Admission labs showed Na+ 132, K+ 3.3, Hgb 12.7. Troponin neg x3, d-dimer 1.71, TSH 1.691, Hgb A1c 8.2. EKG  showed SR. CXR with mild atelectasis. He was given IV lasix in the ED, along with neb. CTA was negative for PE. He has been diuresed with IV lasix 80mg  BID with metolazone 2.5mg  with good diuresis. Net - 4.4L, with weight trending down from 412>>405lbs. Breathing has improved and no longer requiring O2. Of note, echo from 4/19 showed normal EF with G2DD and mild AS.   Inpatient Medications    . ARIPiprazole  5 mg Oral QHS  . colchicine  0.6 mg Oral Daily  . enoxaparin (LOVENOX) injection  80 mg Subcutaneous Q24H  . fluticasone  2 spray Each Nare Daily  . furosemide  80 mg Intravenous Q12H  . gabapentin  300 mg Oral BID WC  . gabapentin  600 mg Oral QHS  . insulin aspart  0-9 Units Subcutaneous TID WC  . insulin aspart  3 Units Subcutaneous TID WC  . ipratropium-albuterol  3 mL Nebulization TID  . lisinopril  10 mg Oral Daily  . metolazone  2.5 mg Oral Q M,W,F  . potassium chloride  20 mEq Oral Daily  . predniSONE  40 mg Oral Q breakfast  . temazepam  15 mg Oral QHS  . traZODone  200 mg Oral QHS    Family History    Family History  Problem Relation Age of Onset  . CAD Maternal Grandfather   . Diabetes Mellitus II Neg Hx     Social History    Social History   Socioeconomic History  . Marital status: Single    Spouse name: Not on file  . Number of children: Not on file  . Years of education: Not on file  . Highest education level: Not on file  Occupational History  . Not on file  Social Needs  . Financial resource strain: Not on file  . Food insecurity:    Worry: Not on file    Inability: Not on file  . Transportation needs:    Medical: Not on file    Non-medical: Not on file  Tobacco Use  . Smoking status: Former Smoker    Last attempt to quit: 03/07/2016    Years since quitting: 1.8  . Smokeless tobacco: Never Used  Substance and Sexual Activity  . Alcohol use: Yes    Comment: rare  . Drug use: No  . Sexual activity: Not on file  Lifestyle  . Physical  activity:    Days per week: Not on file    Minutes per session: Not on file  . Stress: Not on file  Relationships  . Social connections:    Talks on phone: Not on file    Gets together: Not on file    Attends religious service: Not on file    Active member of club or organization: Not on file    Attends meetings of clubs or organizations: Not on file    Relationship status: Not on file  . Intimate partner violence:    Fear of current or ex partner: Not on file    Emotionally abused: Not on file    Physically abused: Not on file    Forced sexual activity: Not on file  Other Topics Concern  . Not on file  Social History Narrative  . Not on file     Review of Systems    See HPI  All other systems reviewed and are otherwise negative except as noted above.  Physical Exam    Blood pressure 124/69, pulse 91, temperature 98.5 F (36.9 C), temperature source Oral, resp. rate 18, height 6\' 1"  (1.854 m), weight (!) 405 lb 3.2 oz (183.8 kg), SpO2 93 %.  General: Pleasant, morbidly obese WM, NAD Psych: Normal affect. Neuro: Alert and oriented X 3. Moves all extremities spontaneously. HEENT: Normal  Neck: Supple, difficult to assess JVD due to girth. Lungs:  Resp regular and unlabored, CTA. Heart: RRR no s3, s4, soft systolic murmurs. Abdomen: Soft, obese, non-tender, non-distended, BS + x 4.  Extremities: No clubbing, cyanosis, 1+ LE edema with redness noted bilaterally. DP/PT/Radials 2+ and equal bilaterally.  Labs    Troponin [facility] Care Test) Recent Labs    01/12/18 2235  TROPIPOC 0.00   Recent Labs    01/13/18 0519 01/13/18 0646 01/13/18 1210 01/13/18 1900  TROPONINI <0.03 <0.03 <0.03 <0.03   Lab Results  Component Value Date   WBC 11.8 (H) 01/13/2018   HGB 12.9 (L) 01/13/2018   HCT 39.8 01/13/2018   MCV 88.1 01/13/2018   PLT 316 01/13/2018    Recent Labs  Lab 01/13/18 0519  NA 132*  K 3.4*  CL 89*  CO2 30  BUN 24*  CREATININE 1.07  CALCIUM 8.8*    PROT 6.4*  BILITOT 0.8  ALKPHOS 59  ALT 21  AST 15  GLUCOSE 138*  No results found for: CHOL, HDL, LDLCALC, TRIG Lab Results  Component Value Date   DDIMER 1.71 (H) 01/13/2018     Radiology Studies    Dg Chest 2 View  Result Date: 01/14/2018 CLINICAL DATA:  Dyspnea for 1 week. History of diabetes, hypertension and congestive heart failure. EXAM: CHEST - 2 VIEW COMPARISON:  Chest radiograph 01/12/2018.  CT 01/13/2018. FINDINGS: There is stable cardiac enlargement. The mediastinal contours are stable. Mild central airway thickening and atelectasis at both bases are stable. There is no edema, confluent airspace opacity, pleural effusion or pneumothorax. Mild degenerative changes are present throughout the thoracic spine. IMPRESSION: Stable chest without acute findings. Cardiomegaly and mild bibasilar atelectasis. Electronically Signed   By: Richardean Sale M.D.   On: 01/14/2018 09:17   Dg Chest 2 View  Result Date: 01/12/2018 CLINICAL DATA:  Initial evaluation for acute shortness of breath. History of CHF and COPD. EXAM: CHEST - 2 VIEW COMPARISON:  Prior radiograph from 01/31/2017. FINDINGS: Stable cardiomegaly. Mediastinal silhouette normal. Aortic atherosclerosis. Lungs normally inflated. Chronic bronchitic changes, consistent with history of COPD. No consolidative airspace opacity to suggest pneumonia. No pulmonary edema or pleural effusion. No pneumothorax. No acute osseous abnormality. Degenerative changes noted throughout the visualized spine. IMPRESSION: 1. Chronic changes related to COPD. No focal infiltrates or other active cardiopulmonary disease. 2. Stable cardiomegaly without pulmonary edema. Electronically Signed   By: Jeannine Boga M.D.   On: 01/12/2018 22:33   Ct Angio Chest Pe W Or Wo Contrast  Result Date: 01/13/2018 CLINICAL DATA:  Acute respiratory failure with hypoxia. CHF. Elevated D-dimer. EXAM: CT ANGIOGRAPHY CHEST WITH CONTRAST TECHNIQUE: Multidetector CT imaging  of the chest was performed using the standard protocol during bolus administration of intravenous contrast. Multiplanar CT image reconstructions and MIPs were obtained to evaluate the vascular anatomy. CONTRAST:  161mL ISOVUE-370 IOPAMIDOL (ISOVUE-370) INJECTION 76% COMPARISON:  Chest radiograph from one day prior. FINDINGS: Cardiovascular: The study is moderate quality for the evaluation of pulmonary embolism, limited by suboptimal contrast opacification of the pulmonary arterial tree. There are no filling defects in the central, lobar, segmental or subsegmental pulmonary artery branches to suggest acute pulmonary embolism. Atherosclerotic nonaneurysmal thoracic aorta. Normal caliber pulmonary arteries. Mild cardiomegaly. No significant pericardial fluid/thickening. Three-vessel coronary atherosclerosis. Mediastinum/Nodes: No discrete thyroid nodules. Unremarkable esophagus. No pathologically enlarged axillary, mediastinal or hilar lymph nodes. Lungs/Pleura: No pneumothorax. No pleural effusion. No acute consolidative airspace disease, lung masses or significant pulmonary nodules. Hypoventilatory changes in the dependent lungs. Upper abdomen: Simple liver cysts, largest 3.0 cm in the anterior liver. Musculoskeletal: No aggressive appearing focal osseous lesions. Moderate thoracic spondylosis. Review of the MIP images confirms the above findings. IMPRESSION: 1. No evidence of pulmonary embolism. 2. Hypoventilatory changes in the lungs, with otherwise no active pulmonary disease. 3. Mild cardiomegaly.  Three-vessel coronary atherosclerosis. Aortic Atherosclerosis (ICD10-I70.0). Electronically Signed   By: Ilona Sorrel M.D.   On: 01/13/2018 14:32    ECG & Cardiac Imaging    EKG:  The EKG was personally reviewed and demonstrates SR.  Echo: 4/19  Study Conclusions  - Left ventricle: The cavity size was normal. Wall thickness was   increased in a pattern of mild LVH. Systolic function was normal.   The  estimated ejection fraction was in the range of 60% to 65%.   Wall motion was normal; there were no regional wall motion   abnormalities. Features are consistent with a pseudonormal left   ventricular filling pattern, with concomitant abnormal relaxation  and increased filling pressure (grade 2 diastolic dysfunction). - Aortic valve: There was mild stenosis. Mean gradient (S): 15 mm   Hg. Valve area (VTI): 1.58 cm^2. - Aorta: Mildly dilated aortic root. Aortic root dimension: 38 mm   (ED). - Mitral valve: Mildly calcified annulus. There was no significant   regurgitation. - Right ventricle: The cavity size was normal. Systolic function   was normal. - Pulmonary arteries: No complete TR doppler jet so unable to   estimate PA systolic pressure. - Systemic veins: IVC not visualized. - Pericardium, extracardiac: A trivial pericardial effusion was   identified.  Impressions:  - Normal LV size with mild LV hypertrophy. EF 60-65%. Moderate   diastolic dysfunction. Mild aortic stenosis. Normal RV size and   systolic function.  Assessment & Plan    62 yo male with PMH of diastolic HF, HTN, DM, OSA, and PTSD who presented with worsening shortness of breath.   1. Acute on Chronic diastolic HF: Hx of same and has been on lasix 80mg  BID with metolazone 2.5mg  MWF prior to admission. Has responded well to IV lasix here with weight trending down. Would continue with IV lasix for now as he still has LE edema.  -- echo pending -- discussed the need for weight loss and lifestyle changes moving forward -- continue with I&Os  2. HTN: controlled with current therapy  3. DM: Hgb A1c 8.2. Currently on SSI. On metformin at home.   4. OSA on Cpap: Reports being compliant with this at home. Continue with the same  5. Recent LE cellulitis: Redness present bilaterally. No fever, but WBC was elevated on admission. Management per primary  Signed, Reino Bellis, NP-C Pager [phone] 01/15/2018,  11:00 AM

## 2018-01-15 NOTE — Progress Notes (Signed)
Pt c/o 6/10 cp, EKG and vitals obtained. CP went down to 2/10 and pt given pain medication. MD paged. Now CP free.

## 2018-01-15 NOTE — Progress Notes (Addendum)
[facility]                                                                              Patient Demographics  Lawrence [name], is a 62 y.o. male, DOB - [birth year], NID:[id number]  Admit date - 01/12/2018   Admitting Physician Rise Patience, MD  Outpatient Primary MD for the patient is [place]  Outpatient specialists:   LOS - 2  days   Medical records reviewed and are as summarized below:    Chief Complaint  Patient presents with  . Shortness of Breath       Brief summary   Patient is a a 62 year old male with history of diastolic CHF, hypertension, recently diagnosed diabetes mellitus, obstructive sleep apnea, presented to ED with increasing shortness of breath worse with exertion over the last few days.  Also reported some chest pain radiating to the back with pleuritic component.  Patient also reported increasing pain in the right foot between first and second toes. In ED patient was found to be hypoxic, mildly wheezing.  Patient was given Lasix and nebulizer with some improvement.   Assessment & Plan    Principal Problem:   Acute respiratory failure with hypoxia (HCC) possibly from acute on chronic diastolic CHF with COPD exacerbation - continue diuresis IV Lasix to 80 mg every 12 hours with Zaroxolyn -D-dimer elevated, CT angiogram showed no acute PE,follow venous Doppler of the lower extremities.  -Weight 412 at the time of admission, improved to 405 today, negative balance of 4.4L - Chest x-ray 6/9 showed spaces stable chest with no acute findings, cardiomegaly with mild bibasilar atelectasis.  - Repeat 2D echo  - cardiology consulted ([facility], states has not seen cardiologist)  Active Problems:      OSA (obstructive sleep apnea) -Continue CPAP    Essential hypertension, benign -BP stable    Right foot pain likely acute gout  -Feeling better today -Uric acid 11.8, placed on prednisone 40 mg daily for 4 days, colchicine  0.12 mg x1, then continue 0.6 mg daily until flare resolved. -will place on allopurinol at the time of discharge.  COPD exacerbation -wheezing better, continue duo nebs 3 times daily, prednisone     Diabetes mellitus type 2 in obese ([place]), uncontrolled with hyperglycemia -CBGs fairly stable  -Placed on meal coverage, continue sliding scale insulin, hemoglobin A1c 8.2  Code Status: DNR DVT Prophylaxis:  Lovenox  Family Communication: Discussed in detail with the patient, all imaging results, lab results explained to the patient    Disposition Plan: await cardiology evaluation   Time Spent in minutes 25 minutes  Procedures:  CTA chest   Consultants:   Cardiology    Antimicrobials:      Medications  Scheduled Meds: . ARIPiprazole  5 mg Oral QHS  . colchicine  0.6 mg Oral Daily  . enoxaparin (LOVENOX) injection  80 mg Subcutaneous Q24H  . fluticasone  2 spray Each Nare Daily  . furosemide  80 mg Intravenous Q12H  . gabapentin  300 mg Oral BID WC  . gabapentin  600 mg Oral QHS  . insulin aspart  0-9 Units  Subcutaneous TID WC  . insulin aspart  3 Units Subcutaneous TID WC  . ipratropium-albuterol  3 mL Nebulization TID  . lisinopril  10 mg Oral Daily  . metolazone  2.5 mg Oral Q M,W,F  . potassium chloride  20 mEq Oral Daily  . predniSONE  40 mg Oral Q breakfast  . temazepam  15 mg Oral QHS  . traZODone  200 mg Oral QHS   Continuous Infusions: PRN Meds:.acetaminophen **OR** acetaminophen, albuterol, ondansetron **OR** ondansetron (ZOFRAN) IV, oxyCODONE-acetaminophen, sodium chloride   Antibiotics   Anti-infectives (From admission, onward)   None        Subjective:   Delvin Hedeen was seen and examined today. Feeling okay, no chest pain. Sob is slightly improving, no chest pain, nausea, vomiting or any fevers or chills.  Patient denies dizziness,  abdominal pain, N/V/D/C, new weakness, numbess, tingling.   Objective:   Vitals:   01/14/18 1953 01/14/18  2331 01/15/18 0507 01/15/18 0507  BP: 114/60   116/71  Pulse: 93 88  81  Resp:  18  18  Temp: 98.4 F (36.9 C)   98.5 F (36.9 C)  TempSrc: Oral   Oral  SpO2: 92% 94%  93%  Weight:   (!) 183.8 kg (405 lb 3.2 oz)   Height:        Intake/Output Summary (Last 24 hours) at 01/15/2018 0824 Last data filed at 01/15/2018 0507 Gross per 24 hour  Intake 2890 ml  Output 3350 ml  Net -460 ml     Wt Readings from Last 3 Encounters:  01/15/18 (!) 183.8 kg (405 lb 3.2 oz)  11/07/17 (!) 193.5 kg (426 lb 11.2 oz)  03/07/17 (!) 180.7 kg (398 lb 6.4 oz)     Exam  Physical Exam General: Alert and oriented x 3, NAD Eyes:  HEENT:   Cardiovascular: S1 S2 auscultated. Regular rate and rhythm. 1+ pedal edema b/l Respiratory: Decreased breath sound at the bases  gastrointestinal: Soft, nontender, nondistended, + bowel sounds Ext: 1+ pedal edema bilaterally Neuro: no new deficits Musculoskeletal: No digital cyanosis, clubbing Skin: No rashes Psych: Normal affect and demeanor, alert and oriented x3      Data Reviewed:  I have personally reviewed following labs and imaging studies  Micro Results No results found for this or any previous visit (from the past 240 hour(s)).  Radiology Reports Dg Chest 2 View  Result Date: 01/14/2018 CLINICAL DATA:  Dyspnea for 1 week. History of diabetes, hypertension and congestive heart failure. EXAM: CHEST - 2 VIEW COMPARISON:  Chest radiograph 01/12/2018.  CT 01/13/2018. FINDINGS: There is stable cardiac enlargement. The mediastinal contours are stable. Mild central airway thickening and atelectasis at both bases are stable. There is no edema, confluent airspace opacity, pleural effusion or pneumothorax. Mild degenerative changes are present throughout the thoracic spine. IMPRESSION: Stable chest without acute findings. Cardiomegaly and mild bibasilar atelectasis. Electronically Signed   By: Richardean Sale M.D.   On: 01/14/2018 09:17   Dg Chest 2  View  Result Date: 01/12/2018 CLINICAL DATA:  Initial evaluation for acute shortness of breath. History of CHF and COPD. EXAM: CHEST - 2 VIEW COMPARISON:  Prior radiograph from 01/31/2017. FINDINGS: Stable cardiomegaly. Mediastinal silhouette normal. Aortic atherosclerosis. Lungs normally inflated. Chronic bronchitic changes, consistent with history of COPD. No consolidative airspace opacity to suggest pneumonia. No pulmonary edema or pleural effusion. No pneumothorax. No acute osseous abnormality. Degenerative changes noted throughout the visualized spine. IMPRESSION: 1. Chronic changes related to COPD. No focal  infiltrates or other active cardiopulmonary disease. 2. Stable cardiomegaly without pulmonary edema. Electronically Signed   By: Jeannine Boga M.D.   On: 01/12/2018 22:33   Ct Angio Chest Pe W Or Wo Contrast  Result Date: 01/13/2018 CLINICAL DATA:  Acute respiratory failure with hypoxia. CHF. Elevated D-dimer. EXAM: CT ANGIOGRAPHY CHEST WITH CONTRAST TECHNIQUE: Multidetector CT imaging of the chest was performed using the standard protocol during bolus administration of intravenous contrast. Multiplanar CT image reconstructions and MIPs were obtained to evaluate the vascular anatomy. CONTRAST:  141mL ISOVUE-370 IOPAMIDOL (ISOVUE-370) INJECTION 76% COMPARISON:  Chest radiograph from one day prior. FINDINGS: Cardiovascular: The study is moderate quality for the evaluation of pulmonary embolism, limited by suboptimal contrast opacification of the pulmonary arterial tree. There are no filling defects in the central, lobar, segmental or subsegmental pulmonary artery branches to suggest acute pulmonary embolism. Atherosclerotic nonaneurysmal thoracic aorta. Normal caliber pulmonary arteries. Mild cardiomegaly. No significant pericardial fluid/thickening. Three-vessel coronary atherosclerosis. Mediastinum/Nodes: No discrete thyroid nodules. Unremarkable esophagus. No pathologically enlarged axillary,  mediastinal or hilar lymph nodes. Lungs/Pleura: No pneumothorax. No pleural effusion. No acute consolidative airspace disease, lung masses or significant pulmonary nodules. Hypoventilatory changes in the dependent lungs. Upper abdomen: Simple liver cysts, largest 3.0 cm in the anterior liver. Musculoskeletal: No aggressive appearing focal osseous lesions. Moderate thoracic spondylosis. Review of the MIP images confirms the above findings. IMPRESSION: 1. No evidence of pulmonary embolism. 2. Hypoventilatory changes in the lungs, with otherwise no active pulmonary disease. 3. Mild cardiomegaly.  Three-vessel coronary atherosclerosis. Aortic Atherosclerosis (ICD10-I70.0). Electronically Signed   By: Ilona Sorrel M.D.   On: 01/13/2018 14:32    Lab Data:  CBC: Recent Labs  Lab 01/12/18 2242 01/12/18 2246 01/13/18 0519  WBC 12.7*  --  11.8*  NEUTROABS  --   --  8.7*  HGB 13.0 13.6 12.9*  HCT 39.5 40.0 39.8  MCV 86.6  --  88.1  PLT 323  --  706   Basic Metabolic Panel: Recent Labs  Lab 01/12/18 2227 01/12/18 2246 01/13/18 0519  NA 131* 132* 132*  K 3.5 3.3* 3.4*  CL 89* 89* 89*  CO2 28  --  30  GLUCOSE 107* 116* 138*  BUN 25* 26* 24*  CREATININE 1.03 1.00 1.07  CALCIUM 8.9  --  8.8*  MG  --   --  1.7   GFR: Estimated Creatinine Clearance: 126.2 mL/min (by C-G formula based on SCr of 1.07 mg/dL). Liver Function Tests: Recent Labs  Lab 01/13/18 0519  AST 15  ALT 21  ALKPHOS 59  BILITOT 0.8  PROT 6.4*  ALBUMIN 3.4*   No results for input(s): LIPASE, AMYLASE in the last 168 hours. No results for input(s): AMMONIA in the last 168 hours. Coagulation Profile: No results for input(s): INR, PROTIME in the last 168 hours. Cardiac Enzymes: Recent Labs  Lab 01/13/18 0519 01/13/18 0646 01/13/18 1210 01/13/18 1900  TROPONINI <0.03 <0.03 <0.03 <0.03   BNP (last 3 results) No results for input(s): PROBNP in the last 8760 hours. HbA1C: Recent Labs    01/14/18 0621  HGBA1C  8.2*   CBG: Recent Labs  Lab 01/14/18 0735 01/14/18 1114 01/14/18 1647 01/14/18 2121 01/15/18 0745  GLUCAP 156* 189* 175* 153* 132*   Lipid Profile: No results for input(s): CHOL, HDL, LDLCALC, TRIG, CHOLHDL, LDLDIRECT in the last 72 hours. Thyroid Function Tests: Recent Labs    01/13/18 0519  TSH 1.691   Anemia Panel: No results for input(s): VITAMINB12, FOLATE, FERRITIN, TIBC,  IRON, RETICCTPCT in the last 72 hours. Urine analysis:    Component Value Date/Time   COLORURINE RED (A) 02/06/2017 1215   APPEARANCEUR CLOUDY (A) 02/06/2017 1215   LABSPEC  02/06/2017 1215    TEST NOT REPORTED DUE TO COLOR INTERFERENCE OF URINE PIGMENT   PHURINE  02/06/2017 1215    TEST NOT REPORTED DUE TO COLOR INTERFERENCE OF URINE PIGMENT   GLUCOSEU (A) 02/06/2017 1215    TEST NOT REPORTED DUE TO COLOR INTERFERENCE OF URINE PIGMENT   HGBUR (A) 02/06/2017 1215    TEST NOT REPORTED DUE TO COLOR INTERFERENCE OF URINE PIGMENT   BILIRUBINUR (A) 02/06/2017 1215    TEST NOT REPORTED DUE TO COLOR INTERFERENCE OF URINE PIGMENT   KETONESUR (A) 02/06/2017 1215    TEST NOT REPORTED DUE TO COLOR INTERFERENCE OF URINE PIGMENT   PROTEINUR (A) 02/06/2017 1215    TEST NOT REPORTED DUE TO COLOR INTERFERENCE OF URINE PIGMENT   NITRITE (A) 02/06/2017 1215    TEST NOT REPORTED DUE TO COLOR INTERFERENCE OF URINE PIGMENT   LEUKOCYTESUR (A) 02/06/2017 1215    TEST NOT REPORTED DUE TO COLOR INTERFERENCE OF URINE PIGMENT     Hildagard Sobecki M.D. [facility] 01/15/2018, 8:24 AM  Pager: [phone] Between 7am to 7pm - call Pager - [phone]  After 7pm go to [email or web address] - password [email or web address]  Call night coverage person covering after 7pm

## 2018-01-15 NOTE — Progress Notes (Signed)
VASCULAR LAB PRELIMINARY  PRELIMINARY  PRELIMINARY  PRELIMINARY  Bilateral lower extremity venous duplex completed.    Preliminary report:  There is no DVT or SVT noted in the bilateral lower extremities.   Meldon Hanzlik, RVT 01/15/2018, 8:53 AM

## 2018-01-16 DIAGNOSIS — R079 Chest pain, unspecified: Secondary | ICD-10-CM

## 2018-01-16 DIAGNOSIS — I2510 Atherosclerotic heart disease of native coronary artery without angina pectoris: Secondary | ICD-10-CM

## 2018-01-16 DIAGNOSIS — E7800 Pure hypercholesterolemia, unspecified: Secondary | ICD-10-CM

## 2018-01-16 DIAGNOSIS — R0789 Other chest pain: Secondary | ICD-10-CM

## 2018-01-16 LAB — BASIC METABOLIC PANEL
Anion gap: 13 (ref 5–15)
Anion gap: 15 (ref 5–15)
BUN: 30 mg/dL — ABNORMAL HIGH (ref 6–20)
BUN: 34 mg/dL — ABNORMAL HIGH (ref 6–20)
CO2: 31 mmol/L (ref 22–32)
CO2: 28 mmol/L (ref 22–32)
Calcium: 9.4 mg/dL (ref 8.9–10.3)
Calcium: 9.2 mg/dL (ref 8.9–10.3)
Chloride: 89 mmol/L — ABNORMAL LOW (ref 101–111)
Chloride: 86 mmol/L — ABNORMAL LOW (ref 101–111)
Creatinine, Ser: 1.22 mg/dL (ref 0.61–1.24)
Creatinine, Ser: 1.35 mg/dL — ABNORMAL HIGH (ref 0.61–1.24)
GFR calc Af Amer: >60 mL/min (ref >60)
GFR calc Af Amer: >60 mL/min (ref >60)
GFR calc non Af Amer: >60 mL/min (ref >60)
GFR calc non Af Amer: 56 mL/min — ABNORMAL LOW (ref >60)
Glucose, Bld: 126 mg/dL — ABNORMAL HIGH (ref 65–99)
Glucose, Bld: 206 mg/dL — ABNORMAL HIGH (ref 65–99)
Potassium: 3.5 mmol/L (ref 3.5–5.1)
Potassium: 3.7 mmol/L (ref 3.5–5.1)
Sodium: 133 mmol/L — ABNORMAL LOW (ref 135–145)
Sodium: 129 mmol/L — ABNORMAL LOW (ref 135–145)

## 2018-01-16 LAB — LIPID PANEL
Cholesterol: 282 mg/dL — ABNORMAL HIGH (ref 0–200)
HDL: 38 mg/dL — ABNORMAL LOW (ref >40)
LDL Cholesterol: 179 mg/dL — ABNORMAL HIGH (ref 0–99)
Total CHOL/HDL Ratio: 7.4 RATIO
Triglycerides: 323 mg/dL — ABNORMAL HIGH (ref <150)
VLDL: 65 mg/dL — ABNORMAL HIGH (ref 0–40)

## 2018-01-16 LAB — GLUCOSE, CAPILLARY
Glucose-Capillary: 114 mg/dL — ABNORMAL HIGH (ref 65–99)
Glucose-Capillary: 162 mg/dL — ABNORMAL HIGH (ref 65–99)
Glucose-Capillary: 210 mg/dL — ABNORMAL HIGH (ref 65–99)
Glucose-Capillary: 161 mg/dL — ABNORMAL HIGH (ref 65–99)

## 2018-01-16 MED ORDER — SODIUM CHLORIDE 0.9% FLUSH: 3.0000 mL | INTRAVENOUS | Status: DC | PRN

## 2018-01-16 MED ORDER — ALBUTEROL SULFATE (2.5 MG/3ML) 0.083% IN NEBU: 2.5000 mg | INHALATION_SOLUTION | RESPIRATORY_TRACT | Status: DC | PRN

## 2018-01-16 MED ORDER — FUROSEMIDE 80 MG PO TABS
80.0000 mg | ORAL_TABLET | Freq: Two times a day (BID) | ORAL | Status: DC
  Administered 2018-01-16 – 2018-01-18 (×4): 80 mg via ORAL
  Filled 2018-01-16 (×5): qty 1

## 2018-01-16 MED ORDER — IPRATROPIUM-ALBUTEROL 0.5-2.5 (3) MG/3ML IN SOLN
3.0000 mL | Freq: Two times a day (BID) | RESPIRATORY_TRACT | Status: DC
  Administered 2018-01-16 – 2018-01-17 (×3): 3 mL via RESPIRATORY_TRACT
  Filled 2018-01-16 (×3): qty 3

## 2018-01-16 MED ORDER — SODIUM CHLORIDE 0.9 % IV SOLN: INTRAVENOUS | Status: DC

## 2018-01-16 MED ORDER — SODIUM CHLORIDE 0.9 % IV SOLN: 250.0000 mL | INTRAVENOUS | Status: DC | PRN

## 2018-01-16 MED ORDER — ATORVASTATIN CALCIUM 40 MG PO TABS
40.0000 mg | ORAL_TABLET | Freq: Every day | ORAL | Status: DC
  Administered 2018-01-16 – 2018-01-17 (×2): 40 mg via ORAL
  Filled 2018-01-16 (×2): qty 1

## 2018-01-16 MED ORDER — SODIUM CHLORIDE 0.9% FLUSH
3.0000 mL | Freq: Two times a day (BID) | INTRAVENOUS | Status: DC
  Administered 2018-01-16 – 2018-01-17 (×3): 3 mL via INTRAVENOUS

## 2018-01-16 MED ORDER — ASPIRIN 81 MG PO CHEW
81.0000 mg | CHEWABLE_TABLET | ORAL | Status: AC
  Administered 2018-01-17: 81 mg via ORAL
  Filled 2018-01-16: qty 1

## 2018-01-16 NOTE — H&P (View-Only) (Signed)
Progress Note  Patient Name: Lawrence [name] Date of Encounter: 01/16/2018  Primary Cardiologist: No primary care provider on file.   Subjective   SOB has improved.  Had further left sided CP last night.    Inpatient Medications    Scheduled Meds: . ARIPiprazole  5 mg Oral QHS  . aspirin EC  81 mg Oral Daily  . colchicine  0.6 mg Oral Daily  . enoxaparin (LOVENOX) injection  80 mg Subcutaneous Q24H  . fluticasone  2 spray Each Nare Daily  . furosemide  80 mg Intravenous Q12H  . gabapentin  300 mg Oral BID WC  . gabapentin  600 mg Oral QHS  . insulin aspart  0-9 Units Subcutaneous TID WC  . insulin aspart  3 Units Subcutaneous TID WC  . ipratropium-albuterol  3 mL Nebulization TID  . lisinopril  10 mg Oral Daily  . metolazone  2.5 mg Oral Q M,W,F  . potassium chloride  20 mEq Oral Daily  . predniSONE  40 mg Oral Q breakfast  . temazepam  15 mg Oral QHS  . traZODone  200 mg Oral QHS   Continuous Infusions:  PRN Meds: acetaminophen **OR** acetaminophen, albuterol, ondansetron **OR** ondansetron (ZOFRAN) IV, oxyCODONE-acetaminophen, sodium chloride   Vital Signs    Vitals:   01/16/18 0542 01/16/18 0636 01/16/18 0746 01/16/18 0756  BP: 117/67  (!) 108/54   Pulse: 82  81   Resp: 18  16   Temp:      TempSrc:      SpO2: 91% 93% 93% 92%  Weight: (!) 399 lb 4.1 oz (181.1 kg)     Height:        Intake/Output Summary (Last 24 hours) at 01/16/2018 0931 Last data filed at 01/16/2018 0350 Gross per 24 hour  Intake 720 ml  Output 3850 ml  Net -3130 ml   Filed Weights   01/14/18 0443 01/15/18 0507 01/16/18 0542  Weight: (!) 406 lb 4.9 oz (184.3 kg) (!) 405 lb 3.2 oz (183.8 kg) (!) 399 lb 4.1 oz (181.1 kg)    Telemetry    NSR - Personally Reviewed  ECG    No new EKG to review - Personally Reviewed  Physical Exam   GEN: No acute distress.   Neck: No JVD Cardiac: RRR, no murmurs, rubs, or gallops.  Respiratory: Clear to auscultation bilaterally. GI: Soft,  nontender, non-distended  MS: No edema; No deformity. Neuro:  Nonfocal  Psych: Normal affect   Labs    Chemistry Recent Labs  Lab 01/12/18 2227 01/12/18 2246 01/13/18 0519 01/16/18 0621  NA 131* 132* 132* 133*  K 3.5 3.3* 3.4* 3.5  CL 89* 89* 89* 89*  CO2 28  --  30 31  GLUCOSE 107* 116* 138* 126*  BUN 25* 26* 24* 30*  CREATININE 1.03 1.00 1.07 1.22  CALCIUM 8.9  --  8.8* 9.4  PROT  --   --  6.4*  --   ALBUMIN  --   --  3.4*  --   AST  --   --  15  --   ALT  --   --  21  --   ALKPHOS  --   --  59  --   BILITOT  --   --  0.8  --   GFRNONAA >60  --  >60 >60  GFRAA >60  --  >60 >60  ANIONGAP 14  --  13 13     Hematology Recent Labs  Lab 01/12/18  2242 01/12/18 2246 01/13/18 0519  WBC 12.7*  --  11.8*  RBC 4.56  --  4.52  HGB 13.0 13.6 12.9*  HCT 39.5 40.0 39.8  MCV 86.6  --  88.1  MCH 28.5  --  28.5  MCHC 32.9  --  32.4  RDW 13.7  --  13.8  PLT 323  --  316    Cardiac Enzymes Recent Labs  Lab 01/13/18 0519 01/13/18 0646 01/13/18 1210 01/13/18 1900  TROPONINI <0.03 <0.03 <0.03 <0.03    Recent Labs  Lab 01/12/18 2235  TROPIPOC 0.00     BNP Recent Labs  Lab 01/12/18 2242  BNP 29.3     DDimer  Recent Labs  Lab 01/13/18 0646  DDIMER 1.71*     Radiology    No results found.  Cardiac Studies   Study Conclusions  - Left ventricle: The cavity size was normal. Systolic function was   normal. The estimated ejection fraction was in the range of 60%   to 65%. Wall motion was normal; there were no regional wall   motion abnormalities. Features are consistent with a pseudonormal   left ventricular filling pattern, with concomitant abnormal   relaxation and increased filling pressure (grade 2 diastolic   dysfunction). Doppler parameters are consistent with elevated   ventricular end-diastolic filling pressure. - Aortic valve: Transvalvular velocity was minimally increased.   There was no stenosis. There was no regurgitation. - Aortic root:  The aortic root was normal in size. - Mitral valve: There was no regurgitation. - Right ventricle: Systolic function was normal. - Tricuspid valve: There was no regurgitation. - Pulmonic valve: There was no regurgitation. - Pulmonary arteries: Systolic pressure could not be accurately   estimated. - Inferior vena cava: The vessel was normal in size. - Pericardium, extracardiac: There was no pericardial effusion.  Patient Profile     62 y.o. male with PMH of diastolic HF, HTN, DM, OSA, and PTSD who presented with worsening shortness of breath  Assessment & Plan     1. Acute on Chronic diastolic HF - Has been on lasix 80mg  BID with metolazone 2.5mg  MWF prior to admission.  - He put out 4L yesterday and is net neg 7.3L - weight down 13lbs - creatinine now bumped from 1 to 1.22 and BP soft this am - change back to lasix 80mg  PO BID and Metolazone MWF - discussed the need for weight loss and lifestyle changes moving forward - continue with I&Os  2. HTN - BP well controlled and soft this am at 104/82mmHg  3. DM: Hgb A1c 8.2. Currently on SSI. On metformin at home.   4. OSA on Cpap: Reports being compliant with this at home. Continue with the same  5. Recent LE cellulitis: Redness present bilaterally. No fever, but WBC was elevated on admission. Management per primary  6.  Hypokalemia - K+ borderline low at 3.5 - replete per TRH  7.  Chest pain with coronary artery calcifications on Chest CT -right and left sided CP that is somewhat sharp  -he does have 3 vessel  coronary artery calcifications on chest CT.   -He is a very poor candidate for nonischemic evaluation given his morbid obesity this precludes adequate assessment of CAD by coronary CTA as well as nuclear stress test.   -will proceed with cardiac cath tomorrow. -Cardiac catheterization was discussed with the patient fully. The patient understands that risks include but are not limited to stroke (1 in 1000), death (1  in 1000), kidney failure [usually temporary] (1 in 500), bleeding (1 in 200), allergic reaction [possibly serious] (1 in 200).  The patient understands and is willing to proceed.    8.  Hyperlipidemia -LDL elevated at 179 -start atorvastatin 40mg  daily -repeat FLP and ALT in 6 weeks    For questions or updates, please contact [place] Please consult [email or web address] for contact info under Cardiology/STEMI.      Signed, Fransico Him, MD  01/16/2018, 9:31 AM

## 2018-01-16 NOTE — Evaluation (Signed)
Physical Therapy Evaluation Patient Details Name: Lawrence [name] MRN: [id number] DOB: Jan 14, 1956 Today's Date: 01/16/2018   History of Present Illness  Pt is a 62 y.o. male admitted 01/12/18 with worsening SOB and BLE edema; worked up for CHF. Chest CT shows coronary artery calcifications; poor candidate for ischemic evaluation due to morbid obesity. Also with recent LE cellulitis. PMH includes diastolic CHF, HTN, DM, OSA, arthritis, L TKA (2x).    Clinical Impression  Pt presents with an overall decrease in functional mobility secondary to above. PTA, pt lives at home with girlfriend; limited household ambulation with rollator and lift chair to stand, has [place] aide 3x/wk to assist with bathing and household tasks; relies on Transport planner for community distances. Today, pt able to take steps from elevated bed height to recliner with rollator and min guard for balance; c/o chronic R knee pain worsened with WB. Pt would benefit from continued acute PT services to maximize functional mobility and independence prior to d/c with HHPT services.     Follow Up Recommendations Home health PT;Supervision for mobility/OOB    Equipment Recommendations  None recommended by PT    Recommendations for Other Services       Precautions / Restrictions Precautions Precautions: Fall Restrictions Weight Bearing Restrictions: No      Mobility  Bed Mobility Overal bed mobility: Needs Assistance Bed Mobility: Supine to Sit     Supine to sit: Supervision;HOB elevated        Transfers Overall transfer level: Needs assistance Equipment used: 4-wheeled walker Transfers: Sit to/from Stand Sit to Stand: Min guard;From elevated surface         General transfer comment: Performed 2x sit<>stand from elevated bed with rollator and min guard for balance; increased time and effort  Ambulation/Gait Ambulation/Gait assistance: Min guard Ambulation Distance (Feet): 3 Feet Assistive device: 4-wheeled  walker Gait Pattern/deviations: Step-to pattern;Wide base of support Gait velocity: Decreased Gait velocity interpretation: <1.31 ft/sec, indicative of household ambulator General Gait Details: Took steps from bed to recliner with rollator and min guard for balance; c/o R knee pain and lower back pain limiting further amb distance  Stairs            Wheelchair Mobility    Modified Rankin (Stroke Patients Only)       Balance Overall balance assessment: Needs assistance   Sitting balance-Leahy Scale: Fair Sitting balance - Comments: Cannot reach feet requiring assist to don socks     Standing balance-Leahy Scale: Poor Standing balance comment: Reliant on UE support                             Pertinent Vitals/Pain Pain Assessment: Faces Faces Pain Scale: Hurts little more Pain Location: R knee Pain Descriptors / Indicators: Aching;Sore;Constant Pain Intervention(s): Monitored during session;Limited activity within patient's tolerance    Home Living Family/patient expects to be discharged to:: Private residence Living Arrangements: Spouse/significant other Available Help at Discharge: Friend(s);Available 24 hours/day;Home health(Girlfriend) Type of Home: Apartment Home Access: Level entry(threshold)     Home Layout: One level Home Equipment: Walker - 4 wheels;Electric scooter;Shower seat Additional Comments: Girlfriend available for 24/7 supervision, only leaves when [facility] aide present. Attempting to get walk-in shower and/or shower bench from [place]    Prior Function Level of Independence: Needs assistance   Gait / Transfers Assistance Needed: Mod indep with short household distances using rollator; relies on lift chair for recliner. Electric scooter for community distances. Relies on [employer]  or friends for transporation (pt and girlfriend do not drive)  ADL's / Homemaking Assistance Needed: [place] aide comes 3x/wk to assist with bathing and some household tasks         Hand Dominance        Extremity/Trunk Assessment   Upper Extremity Assessment Upper Extremity Assessment: Overall WFL for tasks assessed    Lower Extremity Assessment Lower Extremity Assessment: Generalized weakness       Communication   Communication: No difficulties  Cognition Arousal/Alertness: Awake/alert Behavior During Therapy: WFL for tasks assessed/performed Overall Cognitive Status: Within Functional Limits for tasks assessed                                        General Comments General comments (skin integrity, edema, etc.): SpO2 90-93% on RA    Exercises     Assessment/Plan    PT Assessment Patient needs continued PT services  PT Problem List Decreased strength;Decreased activity tolerance;Decreased balance;Decreased mobility       PT Treatment Interventions DME instruction;Gait training;Stair training;Functional mobility training;Therapeutic activities;Therapeutic exercise;Balance training;Patient/family education    PT Goals (Current goals can be found in the Care Plan section)  Acute Rehab PT Goals Patient Stated Goal: Return home PT Goal Formulation: With patient Time For Goal Achievement: 01/30/18 Potential to Achieve Goals: Good    Frequency Min 3X/week   Barriers to discharge        Co-evaluation               AM-PAC PT "6 Clicks" Daily Activity  Outcome Measure Difficulty turning over in bed (including adjusting bedclothes, sheets and blankets)?: A Little Difficulty moving from lying on back to sitting on the side of the bed? : A Little Difficulty sitting down on and standing up from a chair with arms (e.g., wheelchair, bedside commode, etc,.)?: A Little Help needed moving to and from a bed to chair (including a wheelchair)?: A Little Help needed walking in hospital room?: A Little Help needed climbing 3-5 steps with a railing? : A Lot 6 Click Score: 17    End of Session Equipment Utilized During  Treatment: Gait belt Activity Tolerance: Patient tolerated treatment well;Patient limited by pain Patient left: in chair;with call bell/phone within reach;with family/visitor present Nurse Communication: Mobility status PT Visit Diagnosis: Other abnormalities of gait and mobility (R26.89)    Time: 8101-7510 PT Time Calculation (min) (ACUTE ONLY): 33 min   Charges:   PT Evaluation $PT Eval Moderate Complexity: 1 Mod PT Treatments $Therapeutic Activity: 8-22 mins   PT G Codes:       Mabeline Caras, PT, DPT Acute Rehab Services  Pager: [phone] 01/16/2018, 9:56 AM

## 2018-01-16 NOTE — Progress Notes (Signed)
Inpatient Diabetes Program Recommendations  AACE/ADA: New Consensus Statement on Inpatient Glycemic Control (2019)  Target Ranges:  Prepandial:   less than 140 mg/dL      Peak postprandial:   less than 180 mg/dL (1-2 hours)      Critically ill patients:  140 - 180 mg/dL   Results for [name], [name] (MRN [id number]) as of 01/16/2018 08:12  Ref. Range 01/15/2018 07:45 01/15/2018 11:39 01/15/2018 16:23 01/15/2018 21:02 01/16/2018 07:31  Glucose-Capillary Latest Ref Range: 65 - 99 mg/dL 132 (H) 210 (H) 259 (H) 149 (H) 114 (H)   Review of Glycemic Control  Diabetes history: DM2 Outpatient Diabetes medications: Metformin 500 mg daily Current orders for Inpatient glycemic control: Novolog 0-9 units TID with meals, Novolog 3 units TID with meals for meal coverage; Prednisone 40 mg QAM  Inpatient Diabetes Program Recommendations: Insulin - Meal Coverage: If Prednisone continued as ordered, please consider increasing meal coverage to Novolog 5 units TID with meals.  Thanks, Barnie Alderman, RN, MSN, CDE Diabetes Coordinator Inpatient Diabetes Program [phone] (Team Pager from 8am to 5pm)

## 2018-01-16 NOTE — Progress Notes (Signed)
 [facility]                                                                              Patient Demographics  Lawrence [name], is a 62 y.o. male, DOB - Feb 23, 1956, GQQ:[id number]  Admit date - 01/12/2018   Admitting Physician Rise Patience, MD  Outpatient Primary MD for the patient is Frost  Outpatient specialists:   LOS - 3  days   Medical records reviewed and are as summarized below:    Chief Complaint  Patient presents with  . Shortness of Breath       Brief summary   Patient is a a 62 year old male with history of diastolic CHF, hypertension, recently diagnosed diabetes mellitus, obstructive sleep apnea, presented to ED with increasing shortness of breath worse with exertion over the last few days.  Also reported some chest pain radiating to the back with pleuritic component.  Patient also reported increasing pain in the right foot between first and second toes. In ED patient was found to be hypoxic, mildly wheezing.  Patient was given Lasix and nebulizer with some improvement.   Assessment & Plan    Principal Problem:   Acute respiratory failure with hypoxia (HCC) possibly from acute on chronic diastolic CHF with COPD exacerbation -Patient was placed on IV Lasix with Zaroxolyn -D-dimer elevated, CT angiogram showed no acute PE,follow venous Doppler of the lower extremities.  -Weight 412 at the time of admission, improved to 399 today, negative balance of 7.4L - Chest x-ray 6/9 showed spaces stable chest with no acute findings, cardiomegaly with mild bibasilar atelectasis.  -Echo showed EF of 60 to 65% with grade 2 diastolic dysfunction -Cardiology following, recommended change to oral Lasix today  Active Problems:  Chest pain -Chest CT showed coronary artery calcifications, plan for cardiac cath in a.m. -N.p.o. after midnight, LDL 179, started on Lipitor -Continue aspirin, Lasix, statin, lisinopril      OSA (obstructive sleep  apnea) -Continue CPAP    Essential hypertension, benign -BP stable    Right foot pain likely acute gout  -Slowly improving,  -Uric acid 11.8, placed on prednisone 40 mg daily for 4 days, colchicine 0.12 mg x1, then continue 0.6 mg daily until flare resolved. -will place on allopurinol at the time of discharge.  COPD exacerbation -wheezing better, continue duo nebs 3 times daily, prednisone     Diabetes mellitus type 2 in obese ([place]), uncontrolled with hyperglycemia -CBGs fairly stable, continue meal coverage, sliding scale insulin  hemoglobin A1c 8.2  Code Status: DNR DVT Prophylaxis:  Lovenox  Family Communication: Discussed in detail with the patient, all imaging results, lab results explained to the patient, family members in the room   Disposition Plan: Cardiac cath on 6/12  Time Spent in minutes 25 minutes  Procedures:  CTA chest   Consultants:   Cardiology    Antimicrobials:      Medications  Scheduled Meds: . ARIPiprazole  5 mg Oral QHS  . aspirin EC  81 mg Oral Daily  . atorvastatin  40 mg Oral q1800  . colchicine  0.6 mg Oral Daily  . enoxaparin (LOVENOX) injection  80 mg Subcutaneous Q24H  . fluticasone  2 spray Each Nare Daily  . furosemide  80 mg Oral BID  . gabapentin  300 mg Oral BID WC  . gabapentin  600 mg Oral QHS  . insulin aspart  0-9 Units Subcutaneous TID WC  . insulin aspart  3 Units Subcutaneous TID WC  . ipratropium-albuterol  3 mL Nebulization TID  . lisinopril  10 mg Oral Daily  . metolazone  2.5 mg Oral Q M,W,F  . potassium chloride  20 mEq Oral Daily  . predniSONE  40 mg Oral Q breakfast  . temazepam  15 mg Oral QHS  . traZODone  200 mg Oral QHS   Continuous Infusions: PRN Meds:.acetaminophen **OR** acetaminophen, albuterol, ondansetron **OR** ondansetron (ZOFRAN) IV, oxyCODONE-acetaminophen, sodium chloride   Antibiotics   Anti-infectives (From admission, onward)   None        Subjective:   Lawrence [name] was seen  and examined today.  No fevers or chills, shortness of breath is improving, complaint of left-sided chest pain last night.  Currently no chest pain. Patient denies dizziness,  abdominal pain, N/V/D/C, new weakness, numbess, tingling.   Objective:   Vitals:   01/16/18 0542 01/16/18 0636 01/16/18 0746 01/16/18 0756  BP: 117/67  (!) 108/54   Pulse: 82  81   Resp: 18  16   Temp:      TempSrc:      SpO2: 91% 93% 93% 92%  Weight: (!) 181.1 kg (399 lb 4.1 oz)     Height:        Intake/Output Summary (Last 24 hours) at 01/16/2018 1004 Last data filed at 01/16/2018 0350 Gross per 24 hour  Intake 720 ml  Output 3850 ml  Net -3130 ml     Wt Readings from Last 3 Encounters:  01/16/18 (!) 181.1 kg (399 lb 4.1 oz)  11/07/17 (!) 193.5 kg (426 lb 11.2 oz)  03/07/17 (!) 180.7 kg (398 lb 6.4 oz)     Exam   General: Alert and oriented x 3, NAD  Eyes:  HEENT:  Atraumatic, normocephalic, normal oropharynx  Cardiovascular: S1 S2 clear Regular rate and rhythm.trace pedal edema b/l  Respiratory: Clear to auscultation bilaterally, no wheezing, rales or rhonchi  Gastrointestinal: Soft, nontender, nondistended, + bowel sounds  Ext: trace pedal edema bilaterally  Neuro: no new deficits  Musculoskeletal: No digital cyanosis, clubbing  Skin: No rashes  Psych: Normal affect and demeanor, alert and oriented x3    Data Reviewed:  I have personally reviewed following labs and imaging studies  Micro Results No results found for this or any previous visit (from the past 240 hour(s)).  Radiology Reports Dg Chest 2 View  Result Date: 01/14/2018 CLINICAL DATA:  Dyspnea for 1 week. History of diabetes, hypertension and congestive heart failure. EXAM: CHEST - 2 VIEW COMPARISON:  Chest radiograph 01/12/2018.  CT 01/13/2018. FINDINGS: There is stable cardiac enlargement. The mediastinal contours are stable. Mild central airway thickening and atelectasis at both bases are stable. There is no  edema, confluent airspace opacity, pleural effusion or pneumothorax. Mild degenerative changes are present throughout the thoracic spine. IMPRESSION: Stable chest without acute findings. Cardiomegaly and mild bibasilar atelectasis. Electronically Signed   By: Richardean Sale M.D.   On: 01/14/2018 09:17   Dg Chest 2 View  Result Date: 01/12/2018 CLINICAL DATA:  Initial evaluation for acute shortness of breath. History of CHF and COPD. EXAM: CHEST - 2 VIEW COMPARISON:  Prior radiograph from 01/31/2017. FINDINGS: Stable  cardiomegaly. Mediastinal silhouette normal. Aortic atherosclerosis. Lungs normally inflated. Chronic bronchitic changes, consistent with history of COPD. No consolidative airspace opacity to suggest pneumonia. No pulmonary edema or pleural effusion. No pneumothorax. No acute osseous abnormality. Degenerative changes noted throughout the visualized spine. IMPRESSION: 1. Chronic changes related to COPD. No focal infiltrates or other active cardiopulmonary disease. 2. Stable cardiomegaly without pulmonary edema. Electronically Signed   By: Jeannine Boga M.D.   On: 01/12/2018 22:33   Ct Angio Chest Pe W Or Wo Contrast  Result Date: 01/13/2018 CLINICAL DATA:  Acute respiratory failure with hypoxia. CHF. Elevated D-dimer. EXAM: CT ANGIOGRAPHY CHEST WITH CONTRAST TECHNIQUE: Multidetector CT imaging of the chest was performed using the standard protocol during bolus administration of intravenous contrast. Multiplanar CT image reconstructions and MIPs were obtained to evaluate the vascular anatomy. CONTRAST:  138mL ISOVUE-370 IOPAMIDOL (ISOVUE-370) INJECTION 76% COMPARISON:  Chest radiograph from one day prior. FINDINGS: Cardiovascular: The study is moderate quality for the evaluation of pulmonary embolism, limited by suboptimal contrast opacification of the pulmonary arterial tree. There are no filling defects in the central, lobar, segmental or subsegmental pulmonary artery branches to suggest  acute pulmonary embolism. Atherosclerotic nonaneurysmal thoracic aorta. Normal caliber pulmonary arteries. Mild cardiomegaly. No significant pericardial fluid/thickening. Three-vessel coronary atherosclerosis. Mediastinum/Nodes: No discrete thyroid nodules. Unremarkable esophagus. No pathologically enlarged axillary, mediastinal or hilar lymph nodes. Lungs/Pleura: No pneumothorax. No pleural effusion. No acute consolidative airspace disease, lung masses or significant pulmonary nodules. Hypoventilatory changes in the dependent lungs. Upper abdomen: Simple liver cysts, largest 3.0 cm in the anterior liver. Musculoskeletal: No aggressive appearing focal osseous lesions. Moderate thoracic spondylosis. Review of the MIP images confirms the above findings. IMPRESSION: 1. No evidence of pulmonary embolism. 2. Hypoventilatory changes in the lungs, with otherwise no active pulmonary disease. 3. Mild cardiomegaly.  Three-vessel coronary atherosclerosis. Aortic Atherosclerosis (ICD10-I70.0). Electronically Signed   By: Ilona Sorrel M.D.   On: 01/13/2018 14:32    Lab Data:  CBC: Recent Labs  Lab 01/12/18 2242 01/12/18 2246 01/13/18 0519  WBC 12.7*  --  11.8*  NEUTROABS  --   --  8.7*  HGB 13.0 13.6 12.9*  HCT 39.5 40.0 39.8  MCV 86.6  --  88.1  PLT 323  --  902   Basic Metabolic Panel: Recent Labs  Lab 01/12/18 2227 01/12/18 2246 01/13/18 0519 01/16/18 0621  NA 131* 132* 132* 133*  K 3.5 3.3* 3.4* 3.5  CL 89* 89* 89* 89*  CO2 28  --  30 31  GLUCOSE 107* 116* 138* 126*  BUN 25* 26* 24* 30*  CREATININE 1.03 1.00 1.07 1.22  CALCIUM 8.9  --  8.8* 9.4  MG  --   --  1.7  --    GFR: Estimated Creatinine Clearance: 109.7 mL/min (by C-G formula based on SCr of 1.22 mg/dL). Liver Function Tests: Recent Labs  Lab 01/13/18 0519  AST 15  ALT 21  ALKPHOS 59  BILITOT 0.8  PROT 6.4*  ALBUMIN 3.4*   No results for input(s): LIPASE, AMYLASE in the last 168 hours. No results for input(s): AMMONIA  in the last 168 hours. Coagulation Profile: No results for input(s): INR, PROTIME in the last 168 hours. Cardiac Enzymes: Recent Labs  Lab 01/13/18 0519 01/13/18 0646 01/13/18 1210 01/13/18 1900  TROPONINI <0.03 <0.03 <0.03 <0.03   BNP (last 3 results) No results for input(s): PROBNP in the last 8760 hours. HbA1C: Recent Labs    01/14/18 0621  HGBA1C 8.2*   CBG:  Recent Labs  Lab 01/15/18 0745 01/15/18 1139 01/15/18 1623 01/15/18 2102 01/16/18 0731  GLUCAP 132* 210* 259* 149* 114*   Lipid Profile: Recent Labs    01/16/18 0621  CHOL 282*  HDL 38*  LDLCALC 179*  TRIG 323*  CHOLHDL 7.4   Thyroid Function Tests: No results for input(s): TSH, T4TOTAL, FREET4, T3FREE, THYROIDAB in the last 72 hours. Anemia Panel: No results for input(s): VITAMINB12, FOLATE, FERRITIN, TIBC, IRON, RETICCTPCT in the last 72 hours. Urine analysis:    Component Value Date/Time   COLORURINE RED (A) 02/06/2017 1215   APPEARANCEUR CLOUDY (A) 02/06/2017 1215   LABSPEC  02/06/2017 1215    TEST NOT REPORTED DUE TO COLOR INTERFERENCE OF URINE PIGMENT   PHURINE  02/06/2017 1215    TEST NOT REPORTED DUE TO COLOR INTERFERENCE OF URINE PIGMENT   GLUCOSEU (A) 02/06/2017 1215    TEST NOT REPORTED DUE TO COLOR INTERFERENCE OF URINE PIGMENT   HGBUR (A) 02/06/2017 1215    TEST NOT REPORTED DUE TO COLOR INTERFERENCE OF URINE PIGMENT   BILIRUBINUR (A) 02/06/2017 1215    TEST NOT REPORTED DUE TO COLOR INTERFERENCE OF URINE PIGMENT   KETONESUR (A) 02/06/2017 1215    TEST NOT REPORTED DUE TO COLOR INTERFERENCE OF URINE PIGMENT   PROTEINUR (A) 02/06/2017 1215    TEST NOT REPORTED DUE TO COLOR INTERFERENCE OF URINE PIGMENT   NITRITE (A) 02/06/2017 1215    TEST NOT REPORTED DUE TO COLOR INTERFERENCE OF URINE PIGMENT   LEUKOCYTESUR (A) 02/06/2017 1215    TEST NOT REPORTED DUE TO COLOR INTERFERENCE OF URINE PIGMENT       M.D. [facility] 01/16/2018, 10:04 AM  Pager:  [phone] Between 7am to 7pm - call Pager - [phone]  After 7pm go to [email or web address] - password [email or web address]  Call night coverage person covering after 7pm

## 2018-01-16 NOTE — Care Management Note (Addendum)
Case Management Note  Patient Details  Name: Lawrence [name] MRN: [id number] Date of Birth: [birth year]  Subjective/Objective:   Acute Resp Failure                Action/Plan: Patient lives at home with [name]; goes to the [place], Dr Arletha Grippe for medical care; SW is [facility] [phone] ext [phone]) use their pharmacy also; is active with [facility] for HHRN/PT/aide /SW; DME - electric scooter, rollater ( walker with seat), life chair; CM will continue to follow for progression of care.  Expected Discharge Date:    possibly 01/20/2018              Expected Discharge Plan:  [name]  Discharge planning Services  CM Consult  Choice offered to:  Patient  HH Arranged:  RN, Disease Management, PT, Nurse's Aide, Social Work [employer] Agency:  [facility] [employer]  Status of Service:   In progress  Sherrilyn Rist [phone] 01/16/2018, 10:54 AM

## 2018-01-16 NOTE — Progress Notes (Addendum)
Progress Note  Patient Name: Lawrence [name] Date of Encounter: 01/16/2018  Primary Cardiologist: No primary care provider on file.   Subjective   SOB has improved.  Had further left sided CP last night.    Inpatient Medications    Scheduled Meds: . ARIPiprazole  5 mg Oral QHS  . aspirin EC  81 mg Oral Daily  . colchicine  0.6 mg Oral Daily  . enoxaparin (LOVENOX) injection  80 mg Subcutaneous Q24H  . fluticasone  2 spray Each Nare Daily  . furosemide  80 mg Intravenous Q12H  . gabapentin  300 mg Oral BID WC  . gabapentin  600 mg Oral QHS  . insulin aspart  0-9 Units Subcutaneous TID WC  . insulin aspart  3 Units Subcutaneous TID WC  . ipratropium-albuterol  3 mL Nebulization TID  . lisinopril  10 mg Oral Daily  . metolazone  2.5 mg Oral Q M,W,F  . potassium chloride  20 mEq Oral Daily  . predniSONE  40 mg Oral Q breakfast  . temazepam  15 mg Oral QHS  . traZODone  200 mg Oral QHS   Continuous Infusions:  PRN Meds: acetaminophen **OR** acetaminophen, albuterol, ondansetron **OR** ondansetron (ZOFRAN) IV, oxyCODONE-acetaminophen, sodium chloride   Vital Signs    Vitals:   01/16/18 0542 01/16/18 0636 01/16/18 0746 01/16/18 0756  BP: 117/67  (!) 108/54   Pulse: 82  81   Resp: 18  16   Temp:      TempSrc:      SpO2: 91% 93% 93% 92%  Weight: (!) 399 lb 4.1 oz (181.1 kg)     Height:        Intake/Output Summary (Last 24 hours) at 01/16/2018 0931 Last data filed at 01/16/2018 0350 Gross per 24 hour  Intake 720 ml  Output 3850 ml  Net -3130 ml   Filed Weights   01/14/18 0443 01/15/18 0507 01/16/18 0542  Weight: (!) 406 lb 4.9 oz (184.3 kg) (!) 405 lb 3.2 oz (183.8 kg) (!) 399 lb 4.1 oz (181.1 kg)    Telemetry    NSR - Personally Reviewed  ECG    No new EKG to review - Personally Reviewed  Physical Exam   GEN: No acute distress.   Neck: No JVD Cardiac: RRR, no murmurs, rubs, or gallops.  Respiratory: Clear to auscultation bilaterally. GI: Soft,  nontender, non-distended  MS: No edema; No deformity. Neuro:  Nonfocal  Psych: Normal affect   Labs    Chemistry Recent Labs  Lab 01/12/18 2227 01/12/18 2246 01/13/18 0519 01/16/18 0621  NA 131* 132* 132* 133*  K 3.5 3.3* 3.4* 3.5  CL 89* 89* 89* 89*  CO2 28  --  30 31  GLUCOSE 107* 116* 138* 126*  BUN 25* 26* 24* 30*  CREATININE 1.03 1.00 1.07 1.22  CALCIUM 8.9  --  8.8* 9.4  PROT  --   --  6.4*  --   ALBUMIN  --   --  3.4*  --   AST  --   --  15  --   ALT  --   --  21  --   ALKPHOS  --   --  59  --   BILITOT  --   --  0.8  --   GFRNONAA >60  --  >60 >60  GFRAA >60  --  >60 >60  ANIONGAP 14  --  13 13     Hematology Recent Labs  Lab 01/12/18  2242 01/12/18 2246 01/13/18 0519  WBC 12.7*  --  11.8*  RBC 4.56  --  4.52  HGB 13.0 13.6 12.9*  HCT 39.5 40.0 39.8  MCV 86.6  --  88.1  MCH 28.5  --  28.5  MCHC 32.9  --  32.4  RDW 13.7  --  13.8  PLT 323  --  316    Cardiac Enzymes Recent Labs  Lab 01/13/18 0519 01/13/18 0646 01/13/18 1210 01/13/18 1900  TROPONINI <0.03 <0.03 <0.03 <0.03    Recent Labs  Lab 01/12/18 2235  TROPIPOC 0.00     BNP Recent Labs  Lab 01/12/18 2242  BNP 29.3     DDimer  Recent Labs  Lab 01/13/18 0646  DDIMER 1.71*     Radiology    No results found.  Cardiac Studies   Study Conclusions  - Left ventricle: The cavity size was normal. Systolic function was   normal. The estimated ejection fraction was in the range of 60%   to 65%. Wall motion was normal; there were no regional wall   motion abnormalities. Features are consistent with a pseudonormal   left ventricular filling pattern, with concomitant abnormal   relaxation and increased filling pressure (grade 2 diastolic   dysfunction). Doppler parameters are consistent with elevated   ventricular end-diastolic filling pressure. - Aortic valve: Transvalvular velocity was minimally increased.   There was no stenosis. There was no regurgitation. - Aortic root:  The aortic root was normal in size. - Mitral valve: There was no regurgitation. - Right ventricle: Systolic function was normal. - Tricuspid valve: There was no regurgitation. - Pulmonic valve: There was no regurgitation. - Pulmonary arteries: Systolic pressure could not be accurately   estimated. - Inferior vena cava: The vessel was normal in size. - Pericardium, extracardiac: There was no pericardial effusion.  Patient Profile     62 y.o. male with PMH of diastolic HF, HTN, DM, OSA, and PTSD who presented with worsening shortness of breath  Assessment & Plan     1. Acute on Chronic diastolic HF - Has been on lasix 80mg  BID with metolazone 2.5mg  MWF prior to admission.  - He put out 4L yesterday and is net neg 7.3L - weight down 13lbs - creatinine now bumped from 1 to 1.22 and BP soft this am - change back to lasix 80mg  PO BID and Metolazone MWF - discussed the need for weight loss and lifestyle changes moving forward - continue with I&Os  2. HTN - BP well controlled and soft this am at 104/48mmHg  3. DM: Hgb A1c 8.2. Currently on SSI. On metformin at home.   4. OSA on Cpap: Reports being compliant with this at home. Continue with the same  5. Recent LE cellulitis: Redness present bilaterally. No fever, but WBC was elevated on admission. Management per primary  6.  Hypokalemia - K+ borderline low at 3.5 - replete per TRH  7.  Chest pain with coronary artery calcifications on Chest CT -right and left sided CP that is somewhat sharp  -he does have 3 vessel  coronary artery calcifications on chest CT.   -He is a very poor candidate for nonischemic evaluation given his morbid obesity this precludes adequate assessment of CAD by coronary CTA as well as nuclear stress test.   -will proceed with cardiac cath tomorrow. -Cardiac catheterization was discussed with the patient fully. The patient understands that risks include but are not limited to stroke (1 in 1000), death (1  in 1000), kidney failure [usually temporary] (1 in 500), bleeding (1 in 200), allergic reaction [possibly serious] (1 in 200).  The patient understands and is willing to proceed.    8.  Hyperlipidemia -LDL elevated at 179 -start atorvastatin 40mg  daily -repeat FLP and ALT in 6 weeks    For questions or updates, please contact [place] Please consult [email or web address] for contact info under Cardiology/STEMI.      Signed, Fransico Him, MD  01/16/2018, 9:31 AM

## 2018-01-17 ENCOUNTER — Encounter (HOSPITAL_COMMUNITY)
Admission: EM | Disposition: A | Discharge status: Home Health | Payer: Self-pay | Source: Home / Self Care | Attending: Internal Medicine

## 2018-01-17 ENCOUNTER — Encounter (HOSPITAL_COMMUNITY): Payer: Self-pay | Admitting: Cardiology

## 2018-01-17 DIAGNOSIS — E669 Obesity, unspecified: Secondary | ICD-10-CM

## 2018-01-17 DIAGNOSIS — I2510 Atherosclerotic heart disease of native coronary artery without angina pectoris: Secondary | ICD-10-CM

## 2018-01-17 DIAGNOSIS — E1169 Type 2 diabetes mellitus with other specified complication: Secondary | ICD-10-CM

## 2018-01-17 HISTORY — PX: LEFT HEART CATH AND CORONARY ANGIOGRAPHY: CATH118249

## 2018-01-17 LAB — GLUCOSE, CAPILLARY
Glucose-Capillary: 133 mg/dL — ABNORMAL HIGH (ref 65–99)
Glucose-Capillary: 202 mg/dL — ABNORMAL HIGH (ref 65–99)
Glucose-Capillary: 204 mg/dL — ABNORMAL HIGH (ref 65–99)
Glucose-Capillary: 162 mg/dL — ABNORMAL HIGH (ref 65–99)

## 2018-01-17 LAB — BASIC METABOLIC PANEL
Anion gap: 14 (ref 5–15)
BUN: 38 mg/dL — ABNORMAL HIGH (ref 6–20)
CO2: 30 mmol/L (ref 22–32)
Calcium: 9.2 mg/dL (ref 8.9–10.3)
Chloride: 87 mmol/L — ABNORMAL LOW (ref 101–111)
Creatinine, Ser: 1.28 mg/dL — ABNORMAL HIGH (ref 0.61–1.24)
GFR calc Af Amer: >60 mL/min (ref >60)
GFR calc non Af Amer: 59 mL/min — ABNORMAL LOW (ref >60)
Glucose, Bld: 128 mg/dL — ABNORMAL HIGH (ref 65–99)
Potassium: 3.1 mmol/L — ABNORMAL LOW (ref 3.5–5.1)
Sodium: 131 mmol/L — ABNORMAL LOW (ref 135–145)

## 2018-01-17 LAB — CBC
HCT: 42.9 % (ref 39.0–52.0)
Hemoglobin: 14.1 g/dL (ref 13.0–17.0)
MCH: 28.3 pg (ref 26.0–34.0)
MCHC: 32.9 g/dL (ref 30.0–36.0)
MCV: 86.1 fL (ref 78.0–100.0)
Platelets: 384 10*3/uL (ref 150–400)
RBC: 4.98 MIL/uL (ref 4.22–5.81)
RDW: 13.7 % (ref 11.5–15.5)
WBC: 13.8 10*3/uL — ABNORMAL HIGH (ref 4.0–10.5)

## 2018-01-17 SURGERY — LEFT HEART CATH AND CORONARY ANGIOGRAPHY
Anesthesia: LOCAL

## 2018-01-17 MED ORDER — LIDOCAINE HCL (PF) 1 % IJ SOLN
INTRAMUSCULAR | Status: AC
  Filled 2018-01-17: qty 30

## 2018-01-17 MED ORDER — IOHEXOL 350 MG/ML SOLN
INTRAVENOUS | Status: DC | PRN
  Administered 2018-01-17: 85 mL via INTRAVENOUS

## 2018-01-17 MED ORDER — VERAPAMIL HCL 2.5 MG/ML IV SOLN
INTRAVENOUS | Status: DC | PRN
  Administered 2018-01-17: 10 mL via INTRA_ARTERIAL

## 2018-01-17 MED ORDER — LIDOCAINE HCL (PF) 1 % IJ SOLN
INTRAMUSCULAR | Status: DC | PRN
  Administered 2018-01-17: 2 mL

## 2018-01-17 MED ORDER — SODIUM CHLORIDE 0.9 % IV SOLN
INTRAVENOUS | Status: AC
  Administered 2018-01-17: 11:00:00 via INTRAVENOUS

## 2018-01-17 MED ORDER — POTASSIUM CHLORIDE CRYS ER 20 MEQ PO TBCR
40.0000 meq | EXTENDED_RELEASE_TABLET | Freq: Every day | ORAL | Status: DC
  Administered 2018-01-17 – 2018-01-18 (×2): 40 meq via ORAL
  Filled 2018-01-17 (×2): qty 2

## 2018-01-17 MED ORDER — HEPARIN (PORCINE) IN NACL 2-0.9 UNITS/ML
INTRAMUSCULAR | Status: AC | PRN
  Administered 2018-01-17 (×2): 500 mL

## 2018-01-17 MED ORDER — HEPARIN SODIUM (PORCINE) 1000 UNIT/ML IJ SOLN
INTRAMUSCULAR | Status: AC
  Filled 2018-01-17: qty 1

## 2018-01-17 MED ORDER — HEPARIN SODIUM (PORCINE) 1000 UNIT/ML IJ SOLN
INTRAMUSCULAR | Status: DC | PRN
  Administered 2018-01-17: 7000 [IU] via INTRAVENOUS

## 2018-01-17 MED ORDER — SODIUM CHLORIDE 0.9% FLUSH
3.0000 mL | Freq: Two times a day (BID) | INTRAVENOUS | Status: DC
  Administered 2018-01-17 – 2018-01-18 (×3): 3 mL via INTRAVENOUS

## 2018-01-17 MED ORDER — HEPARIN (PORCINE) IN NACL 1000-0.9 UT/500ML-% IV SOLN
INTRAVENOUS | Status: AC
  Filled 2018-01-17: qty 1000

## 2018-01-17 MED ORDER — VERAPAMIL HCL 2.5 MG/ML IV SOLN
INTRAVENOUS | Status: AC
  Filled 2018-01-17: qty 2

## 2018-01-17 MED ORDER — SODIUM CHLORIDE 0.9 % IV SOLN: 250.0000 mL | INTRAVENOUS | Status: DC | PRN

## 2018-01-17 MED ORDER — SODIUM CHLORIDE 0.9% FLUSH: 3.0000 mL | INTRAVENOUS | Status: DC | PRN

## 2018-01-17 SURGICAL SUPPLY — 16 items
CATH INFINITI 5 FR JL3.5 (CATHETERS) ×2 IMPLANT
CATH INFINITI 5FR ANG PIGTAIL (CATHETERS) ×2 IMPLANT
CATH INFINITI JR4 5F (CATHETERS) ×2 IMPLANT
COVER PRB 48X5XTLSCP FOLD TPE (BAG) ×1 IMPLANT
COVER PROBE 5X48 (BAG) ×1
DEVICE RAD COMP TR BAND LRG (VASCULAR PRODUCTS) ×2 IMPLANT
GLIDESHEATH SLEND A-KIT 6F 22G (SHEATH) ×2 IMPLANT
GUIDEWIRE INQWIRE 1.5J.035X260 (WIRE) ×1 IMPLANT
HOVERMATT SINGLE USE (MISCELLANEOUS) ×2 IMPLANT
INQWIRE 1.5J .035X260CM (WIRE) ×2
KIT HEART LEFT (KITS) ×2 IMPLANT
KIT MICROPUNCTURE NIT STIFF (SHEATH) ×2 IMPLANT
NEEDLE PERC 18GX4CM (NEEDLE) ×2 IMPLANT
PACK CARDIAC CATHETERIZATION (CUSTOM PROCEDURE TRAY) ×2 IMPLANT
TRANSDUCER W/STOPCOCK (MISCELLANEOUS) ×2 IMPLANT
TUBING CIL FLEX 10 FLL-RA (TUBING) ×2 IMPLANT

## 2018-01-17 NOTE — Progress Notes (Signed)
Pt stated he can place self on CPAP for the night when ready, RT made pt aware if he were to need any help to let RT know.

## 2018-01-17 NOTE — Progress Notes (Signed)
Pt placed on CPAP for night time rest.  No issues to report tolerating well at this time.

## 2018-01-17 NOTE — Progress Notes (Signed)
Removed Pt's TR band. It is a level 0. Pt tolerated well. Will continue to elevate.  Will continue to monitor patient.

## 2018-01-17 NOTE — Interval H&P Note (Signed)
History and Physical Interval Note:  01/17/2018 9:41 AM  Lawrence [name]  has presented today for surgery, with the diagnosis of cp- Coronary Artery Calcification on CT.    The various methods of treatment have been discussed with the patient and family. After consideration of risks, benefits and other options for treatment, the patient has consented to  Procedure(s): LEFT HEART CATH AND CORONARY ANGIOGRAPHY (N/A) with possible PERCUTANEOUS CORONARY INTERVENTION  as a surgical intervention .  The patient's history has been reviewed, patient examined, no change in status, stable for surgery.  I have reviewed the patient's chart and labs.  Questions were answered to the patient's satisfaction.    Based upon the patient's body habitus and presence of coronary calcium on chest CT, it was felt that the most prudent course of action for evaluating the patient's chest pain which is an invasive procedure as he is not a good candidate for noninvasive evaluation.  The initial plan is diagnostic catheterization.  Unable to fully fill out appropriate disease criteria taste on the lack of adequate data.  Cath Lab Visit (complete for each Cath Lab visit)  Clinical Evaluation Leading to the Procedure:   ACS: Yes.  - Potentially  Non-ACS:    Anginal Classification: CCS IV   Anti-ischemic medical therapy: No Therapy  Non-Invasive Test Results: No non-invasive testing performed  Prior CABG: No previous CABG   Glenetta Hew

## 2018-01-17 NOTE — Progress Notes (Signed)
[facility]                                                                              Patient Demographics  Lawrence [name], is a 62 y.o. male, DOB - January 07, 1956, [id number]  Admit date - 01/12/2018   Admitting Physician Rise Patience, MD  Outpatient Primary MD for the patient is [place]  Outpatient specialists:   LOS - 4  days   Medical records reviewed and are as summarized below:    Chief Complaint  Patient presents with  . Shortness of Breath       Brief summary   Patient is a a 62 year old male with history of diastolic CHF, hypertension, recently diagnosed diabetes mellitus, obstructive sleep apnea, presented to ED with increasing shortness of breath worse with exertion over the last few days.  Also reported some chest pain radiating to the back with pleuritic component.  Patient also reported increasing pain in the right foot between first and second toes. In ED patient was found to be hypoxic, mildly wheezing.  Patient was given Lasix and nebulizer with some improvement.   Assessment & Plan    Principal Problem:   Acute respiratory failure with hypoxia (HCC) possibly from acute on chronic diastolic CHF with COPD exacerbation -Had a CT angiogram on admission which showed no with no evidence of pulmonary embolism -Improving with diuresis -he is -8.3 L, weight is down 12 pounds -currently on oral Lasix and metolazone Monday Wednesday Friday -Echo showed EF of 60 to 65% with grade 2 diastolic dysfunction -Cardiology following  Chest pain/suspected CAD -CT chest noted coronary artery calcifications, cardiology following -Plan for left heart catheterization today, LDL was 179, started on Lipitor -Continue aspirin, statin, Lasix, hold lisinopril for cath      OSA (obstructive sleep apnea) -Continue CPAP    Essential hypertension, benign -stable, holding lisinopril    Right foot pain likely acute gout  -Slowly improving,    -Uric acid 11.8, placed on prednisone 40 mg daily for 4 days, colchicine 0.12 mg x1, then continue 0.6 mg daily until flare resolved. -add low-dose allopurinol  COPD exacerbation -was also treated earlier this hospitalization by my partner with prednisone and nebulizations, no further wheezing, off steroids now    Diabetes mellitus type 2 in obese ([place]), uncontrolled with hyperglycemia -CBGs fairly stable, continue meal coverage, sliding scale insulin  hemoglobin A1c 8.2  Code Status: DNR DVT Prophylaxis:  Lovenox  Family Communication: no family at bedside Disposition Plan: Cardiac cath on 6/12, home in 1-2 days if stable  Time Spent in minutes 35 minutes  Procedures:  CTA chest   Consultants:   Cardiology    Antimicrobials:      Medications  Scheduled Meds: . ARIPiprazole  5 mg Oral QHS  . aspirin EC  81 mg Oral Daily  . atorvastatin  40 mg Oral q1800  . colchicine  0.6 mg Oral Daily  . enoxaparin (LOVENOX) injection  80 mg Subcutaneous Q24H  . fluticasone  2 spray Each Nare Daily  . furosemide  80 mg Oral BID  . gabapentin  300 mg Oral BID WC  .  gabapentin  600 mg Oral QHS  . insulin aspart  0-9 Units Subcutaneous TID WC  . insulin aspart  3 Units Subcutaneous TID WC  . ipratropium-albuterol  3 mL Nebulization BID  . lisinopril  10 mg Oral Daily  . metolazone  2.5 mg Oral Q M,W,F  . potassium chloride  40 mEq Oral Daily  . sodium chloride flush  3 mL Intravenous Q12H  . temazepam  15 mg Oral QHS  . traZODone  200 mg Oral QHS   Continuous Infusions: . sodium chloride 100 mL/hr at 01/17/18 1120  . sodium chloride     PRN Meds:.sodium chloride, acetaminophen **OR** acetaminophen, albuterol, ondansetron **OR** ondansetron (ZOFRAN) IV, oxyCODONE-acetaminophen, sodium chloride, sodium chloride flush   Antibiotics   Anti-infectives (From admission, onward)   None        Subjective:   Patient seen and examined, reports slow improvement in  breathing, -Denies any chest pain overnight, no nausea or vomiting  Objective:   Vitals:   01/17/18 1028 01/17/18 1033 01/17/18 1038 01/17/18 1107  BP: 109/72 120/66 111/70 107/64  Pulse: 80 80 77 83  Resp: 20 (!) 21 (!) 21 19  Temp:    97.7 F (36.5 C)  TempSrc:    Oral  SpO2: (!) 88% (!) 84% (!) 86% 91%  Weight:      Height:        Intake/Output Summary (Last 24 hours) at 01/17/2018 1205 Last data filed at 01/17/2018 0825 Gross per 24 hour  Intake 723 ml  Output 1775 ml  Net -1052 ml     Wt Readings from Last 3 Encounters:  01/17/18 (!) 181.5 kg (400 lb 2.2 oz)  11/07/17 (!) 193.5 kg (426 lb 11.2 oz)  03/07/17 (!) 180.7 kg (398 lb 6.4 oz)     Exam  Gen: Awake, Alert, Oriented X 3, obese, chronically ill-appearing male HEENT: PERRLA, Neck supple, no JVD Lungs: poor air movement, distant breath sounds, decreased at bases CVS: S1-S2/regular rate rhythm Abd: soft, Non tender, non distended, BS present Extremities: trace edema Skin: no new rashes Psych: Normal affect and demeanor  Data Reviewed:  I have personally reviewed following labs and imaging studies  Micro Results No results found for this or any previous visit (from the past 240 hour(s)).  Radiology Reports Dg Chest 2 View  Result Date: 01/14/2018 CLINICAL DATA:  Dyspnea for 1 week. History of diabetes, hypertension and congestive heart failure. EXAM: CHEST - 2 VIEW COMPARISON:  Chest radiograph 01/12/2018.  CT 01/13/2018. FINDINGS: There is stable cardiac enlargement. The mediastinal contours are stable. Mild central airway thickening and atelectasis at both bases are stable. There is no edema, confluent airspace opacity, pleural effusion or pneumothorax. Mild degenerative changes are present throughout the thoracic spine. IMPRESSION: Stable chest without acute findings. Cardiomegaly and mild bibasilar atelectasis. Electronically Signed   By: Richardean Sale M.D.   On: 01/14/2018 09:17   Dg Chest 2  View  Result Date: 01/12/2018 CLINICAL DATA:  Initial evaluation for acute shortness of breath. History of CHF and COPD. EXAM: CHEST - 2 VIEW COMPARISON:  Prior radiograph from 01/31/2017. FINDINGS: Stable cardiomegaly. Mediastinal silhouette normal. Aortic atherosclerosis. Lungs normally inflated. Chronic bronchitic changes, consistent with history of COPD. No consolidative airspace opacity to suggest pneumonia. No pulmonary edema or pleural effusion. No pneumothorax. No acute osseous abnormality. Degenerative changes noted throughout the visualized spine. IMPRESSION: 1. Chronic changes related to COPD. No focal infiltrates or other active cardiopulmonary disease. 2. Stable cardiomegaly without pulmonary  edema. Electronically Signed   By: Jeannine Boga M.D.   On: 01/12/2018 22:33   Ct Angio Chest Pe W Or Wo Contrast  Result Date: 01/13/2018 CLINICAL DATA:  Acute respiratory failure with hypoxia. CHF. Elevated D-dimer. EXAM: CT ANGIOGRAPHY CHEST WITH CONTRAST TECHNIQUE: Multidetector CT imaging of the chest was performed using the standard protocol during bolus administration of intravenous contrast. Multiplanar CT image reconstructions and MIPs were obtained to evaluate the vascular anatomy. CONTRAST:  147mL ISOVUE-370 IOPAMIDOL (ISOVUE-370) INJECTION 76% COMPARISON:  Chest radiograph from one day prior. FINDINGS: Cardiovascular: The study is moderate quality for the evaluation of pulmonary embolism, limited by suboptimal contrast opacification of the pulmonary arterial tree. There are no filling defects in the central, lobar, segmental or subsegmental pulmonary artery branches to suggest acute pulmonary embolism. Atherosclerotic nonaneurysmal thoracic aorta. Normal caliber pulmonary arteries. Mild cardiomegaly. No significant pericardial fluid/thickening. Three-vessel coronary atherosclerosis. Mediastinum/Nodes: No discrete thyroid nodules. Unremarkable esophagus. No pathologically enlarged axillary,  mediastinal or hilar lymph nodes. Lungs/Pleura: No pneumothorax. No pleural effusion. No acute consolidative airspace disease, lung masses or significant pulmonary nodules. Hypoventilatory changes in the dependent lungs. Upper abdomen: Simple liver cysts, largest 3.0 cm in the anterior liver. Musculoskeletal: No aggressive appearing focal osseous lesions. Moderate thoracic spondylosis. Review of the MIP images confirms the above findings. IMPRESSION: 1. No evidence of pulmonary embolism. 2. Hypoventilatory changes in the lungs, with otherwise no active pulmonary disease. 3. Mild cardiomegaly.  Three-vessel coronary atherosclerosis. Aortic Atherosclerosis (ICD10-I70.0). Electronically Signed   By: Ilona Sorrel M.D.   On: 01/13/2018 14:32    Lab Data:  CBC: Recent Labs  Lab 01/12/18 2242 01/12/18 2246 01/13/18 0519 01/17/18 0529  WBC 12.7*  --  11.8* 13.8*  NEUTROABS  --   --  8.7*  --   HGB 13.0 13.6 12.9* 14.1  HCT 39.5 40.0 39.8 42.9  MCV 86.6  --  88.1 86.1  PLT 323  --  316 295   Basic Metabolic Panel: Recent Labs  Lab 01/12/18 2227 01/12/18 2246 01/13/18 0519 01/16/18 0621 01/16/18 1329 01/17/18 0529  NA 131* 132* 132* 133* 129* 131*  K 3.5 3.3* 3.4* 3.5 3.7 3.1*  CL 89* 89* 89* 89* 86* 87*  CO2 28  --  30 31 28 30   GLUCOSE 107* 116* 138* 126* 206* 128*  BUN 25* 26* 24* 30* 34* 38*  CREATININE 1.03 1.00 1.07 1.22 1.35* 1.28*  CALCIUM 8.9  --  8.8* 9.4 9.2 9.2  MG  --   --  1.7  --   --   --    GFR: Estimated Creatinine Clearance: 104.6 mL/min (A) (by C-G formula based on SCr of 1.28 mg/dL (H)). Liver Function Tests: Recent Labs  Lab 01/13/18 0519  AST 15  ALT 21  ALKPHOS 59  BILITOT 0.8  PROT 6.4*  ALBUMIN 3.4*   No results for input(s): LIPASE, AMYLASE in the last 168 hours. No results for input(s): AMMONIA in the last 168 hours. Coagulation Profile: No results for input(s): INR, PROTIME in the last 168 hours. Cardiac Enzymes: Recent Labs  Lab  01/13/18 0519 01/13/18 0646 01/13/18 1210 01/13/18 1900  TROPONINI <0.03 <0.03 <0.03 <0.03   BNP (last 3 results) No results for input(s): PROBNP in the last 8760 hours. HbA1C: No results for input(s): HGBA1C in the last 72 hours. CBG: Recent Labs  Lab 01/16/18 1115 01/16/18 1627 01/16/18 2041 01/17/18 0729 01/17/18 1123  GLUCAP 162* 210* 161* 133* 202*   Lipid Profile: Recent Labs  01/16/18 0621  CHOL 282*  HDL 38*  LDLCALC 179*  TRIG 323*  CHOLHDL 7.4   Thyroid Function Tests: No results for input(s): TSH, T4TOTAL, FREET4, T3FREE, THYROIDAB in the last 72 hours. Anemia Panel: No results for input(s): VITAMINB12, FOLATE, FERRITIN, TIBC, IRON, RETICCTPCT in the last 72 hours. Urine analysis:    Component Value Date/Time   COLORURINE RED (A) 02/06/2017 1215   APPEARANCEUR CLOUDY (A) 02/06/2017 1215   LABSPEC  02/06/2017 1215    TEST NOT REPORTED DUE TO COLOR INTERFERENCE OF URINE PIGMENT   PHURINE  02/06/2017 1215    TEST NOT REPORTED DUE TO COLOR INTERFERENCE OF URINE PIGMENT   GLUCOSEU (A) 02/06/2017 1215    TEST NOT REPORTED DUE TO COLOR INTERFERENCE OF URINE PIGMENT   HGBUR (A) 02/06/2017 1215    TEST NOT REPORTED DUE TO COLOR INTERFERENCE OF URINE PIGMENT   BILIRUBINUR (A) 02/06/2017 1215    TEST NOT REPORTED DUE TO COLOR INTERFERENCE OF URINE PIGMENT   KETONESUR (A) 02/06/2017 1215    TEST NOT REPORTED DUE TO COLOR INTERFERENCE OF URINE PIGMENT   PROTEINUR (A) 02/06/2017 1215    TEST NOT REPORTED DUE TO COLOR INTERFERENCE OF URINE PIGMENT   NITRITE (A) 02/06/2017 1215    TEST NOT REPORTED DUE TO COLOR INTERFERENCE OF URINE PIGMENT   LEUKOCYTESUR (A) 02/06/2017 1215    TEST NOT REPORTED DUE TO COLOR INTERFERENCE OF URINE PIGMENT     Domenic Polite M.D. [facility] 01/17/2018, 12:05 PM  Page via [email or web address] - password [email or web address]  Call night coverage person covering after 7pm

## 2018-01-17 NOTE — Progress Notes (Signed)
Cath showed mild nonobstructive CAD up to 40% prox LAD and 20% mid LCx and OM2 with normal LVF and normal LVEDP.  Needs aggressive risk factor modification.  LDL was 179 and now started on Lipitor 40mg  daily.  Will need repeat FLP and ALT in 6 weeks.  LVEDP normal so would continue on Lasix 80mg  PO BID and Metolazone MWF.  Replete potassium to keep > 4 per [facility].  Need to consider weight loss surgery.  Need aggressive control of DM.  Continue CPAP.  No other recs at this time will sign off.  Needs to followup at [facility] with his cardiologist

## 2018-01-18 LAB — CBC
HCT: 43.1 % (ref 39.0–52.0)
Hemoglobin: 14.1 g/dL (ref 13.0–17.0)
MCH: 28.5 pg (ref 26.0–34.0)
MCHC: 32.7 g/dL (ref 30.0–36.0)
MCV: 87.2 fL (ref 78.0–100.0)
Platelets: 361 10*3/uL (ref 150–400)
RBC: 4.94 MIL/uL (ref 4.22–5.81)
RDW: 13.6 % (ref 11.5–15.5)
WBC: 12.4 10*3/uL — ABNORMAL HIGH (ref 4.0–10.5)

## 2018-01-18 LAB — BASIC METABOLIC PANEL
Anion gap: 12 (ref 5–15)
BUN: 34 mg/dL — ABNORMAL HIGH (ref 6–20)
CO2: 30 mmol/L (ref 22–32)
Calcium: 9.1 mg/dL (ref 8.9–10.3)
Chloride: 90 mmol/L — ABNORMAL LOW (ref 101–111)
Creatinine, Ser: 1.05 mg/dL (ref 0.61–1.24)
GFR calc Af Amer: >60 mL/min (ref >60)
GFR calc non Af Amer: >60 mL/min (ref >60)
Glucose, Bld: 117 mg/dL — ABNORMAL HIGH (ref 65–99)
Potassium: 3.5 mmol/L (ref 3.5–5.1)
Sodium: 132 mmol/L — ABNORMAL LOW (ref 135–145)

## 2018-01-18 LAB — GLUCOSE, CAPILLARY: Glucose-Capillary: 134 mg/dL — ABNORMAL HIGH (ref 65–99)

## 2018-01-18 MED ORDER — ATORVASTATIN CALCIUM 40 MG PO TABS: 40.0000 mg | ORAL_TABLET | Freq: Every day | ORAL | 0 refills | Status: DC

## 2018-01-18 MED ORDER — ASPIRIN 81 MG PO TBEC: 81.0000 mg | DELAYED_RELEASE_TABLET | Freq: Every day | ORAL | Status: DC

## 2018-01-18 MED ORDER — ALLOPURINOL 100 MG PO TABS: 100.0000 mg | ORAL_TABLET | Freq: Every day | ORAL | 0 refills | Status: DC

## 2018-01-18 MED FILL — Heparin Sod (Porcine)-NaCl IV Soln 1000 Unit/500ML-0.9%: INTRAVENOUS | Qty: 1000 | Status: AC

## 2018-01-18 NOTE — Discharge Summary (Signed)
Physician Discharge Summary  Lawrence [name] MGQ:[id number] DOB: Dec 08, 1955 DOA: 01/12/2018  PCP: [facility]  Admit date: 01/12/2018 Discharge date: 01/18/2018  Time spent: 35 minutes  Recommendations for Outpatient Follow-up:  1. Cardiology at [facility] in 2weeks   Discharge Diagnoses:  Principal Problem:   Acute respiratory failure with hypoxia (HCC)   Acute on chronic diastolic CHF   COPD   OSA (obstructive sleep apnea)   Essential hypertension, benign   Right foot pain   Acute on chronic diastolic CHF (congestive heart failure) (HCC)   Diabetes mellitus type 2 in obese (HCC)   Shortness of breath   Coronary artery calcification seen on CAT scan   Pure hypercholesterolemia   Atypical chest pain   Discharge Condition: stable  Diet recommendation: low sodium heart healthy, DM  Filed Weights   01/16/18 1321 01/17/18 0502 01/18/18 0304  Weight: (!) 181.1 kg (399 lb 4.1 oz) (!) 181.5 kg (400 lb 2.2 oz) (!) 181.4 kg (399 lb 14.6 oz)    History of present illness:  62 year old male with history of diastolic CHF, hypertension, recently diagnosed diabetes mellitus, obstructive sleep apnea, presented to ED with increasing shortness of breath worse with exertion over the last few days.  Also reported some chest pain radiating to the back with pleuritic component.  Patient also reported increasing pain in the right foot between first and second toes.  Hospital Course:  Acute respiratory failure with hypoxia (HCC) possibly from acute on chronic diastolic CHF with COPD exacerbation -Had a CT angiogram on admission which showed no with no evidence of pulmonary embolism -Improved with diuresis -he is -10 L, weight is down 14 pounds at the time of discharge -transitioned from IV to PO lasix and metolazone Monday Wednesday Friday -Echo showed EF of 60 to 65% with grade 2 diastolic dysfunction -Cardiology followed him through this admission -clinically felt to be euvolemic and stable for  discharge at this time -FU with Cardiology at [facility]  Chest pain/suspected CAD -Pt had chest pain this admission and had a CT chest which noted coronary artery calcifications -seen by Cardiology, due to risk factors, he underwent a left heart cath yesterday which showed non obstructive CAD ardiology following - LDL was 179, started on Lipitor -Continue aspirin, statin, Lasix, resume ACE tomorrow      OSA (obstructive sleep apnea) -Continue CPAP    Essential hypertension, benign -stable, restarted lisinopril    Right ankle foot pain likely acute gout  -Improved -Uric acid 11.8, placed on prednisone 40 mg daily for 4 days, colchicine 0.6 mg daily until flare resolved. -added low-dose allopurinol at discharge  COPD exacerbation -was also treated earlier this hospitalization by my partner with prednisone and nebulizations, no further wheezing, off steroids now    Diabetes mellitus type 2 in obese ([place]), uncontrolled with hyperglycemia -CBGs fairly stable, continue meal coverage, sliding scale insulin  hemoglobin A1c 8.2  Code Status: DNR  Procedures: Conclusion     There is hyperdynamic left ventricular systolic function.  LV end diastolic pressure is normal.  The left ventricular ejection fraction is greater than 65% by visual estimate.  There is no mitral valve regurgitation.  There is no aortic valve stenosis.  Prox LAD to Mid LAD lesion is 40% stenosed.  Prox Cx to Mid Cx lesion is 20% stenosed.  Ost 2nd Mrg lesion is 20% stenosed.   Angiographically minimal coronary disease with ectatic RCA and circumflex. Normal LVEDP and normal LV function/hyperdynamic.       Consultations:  Cardiology [facility]  Discharge Exam: Vitals:   01/18/18 0304 01/18/18 0746  BP: (!) 103/43 (!) 103/51  Pulse: 76 83  Resp: 20 18  Temp: 98.1 F (36.7 C)   SpO2: 93% 93%    General: AAOx3 Cardiovascular: S1S2/RRR Respiratory: CTAB  Discharge  Instructions   Discharge Instructions    Diet - low sodium heart healthy   Complete by:  As directed    Diet Carb Modified   Complete by:  As directed    Increase activity slowly   Complete by:  As directed      Allergies as of 01/18/2018      Reactions   Tubersol [tuberculin] Other (See Comments)   Reaction unknown   Vancomycin Other (See Comments)   Red Man Syndrome   Doxycycline Rash      Medication List    TAKE these medications   acetaminophen 500 MG tablet Commonly known as:  TYLENOL Take 500-1,000 mg by mouth every 4 (four) hours.   allopurinol 100 MG tablet Commonly known as:  ZYLOPRIM Take 1 tablet (100 mg total) by mouth daily.   ARIPiprazole 5 MG tablet Commonly known as:  ABILIFY Take 5 mg by mouth at bedtime.   ASPERCREME EX Apply 1 application topically 2 (two) times daily as needed (pain and inflammation).   aspirin 81 MG EC tablet Take 1 tablet (81 mg total) by mouth daily. Start taking on:  01/19/2018   atorvastatin 40 MG tablet Commonly known as:  LIPITOR Take 1 tablet (40 mg total) by mouth daily at 6 PM.   blood glucose meter kit and supplies Dispense based on patient and insurance preference. Use up to four times daily as directed. (FOR ICD-10 E10.9, E11.9).   cetirizine 10 MG tablet Commonly known as:  ZYRTEC Take 10 mg by mouth daily.   fluticasone 50 MCG/ACT nasal spray Commonly known as:  FLONASE Place into both nostrils daily.   furosemide 80 MG tablet Commonly known as:  LASIX Take 80 mg by mouth 2 (two) times daily.   gabapentin 300 MG capsule Commonly known as:  NEURONTIN Take 300-600 mg by mouth 3 (three) times daily. 300 mg IN THE am, at noon and 600 mg at bedtime   lisinopril 10 MG tablet Commonly known as:  PRINIVIL,ZESTRIL Take 10 mg by mouth daily.   loperamide 2 MG capsule Commonly known as:  IMODIUM Take 2 mg by mouth as needed for diarrhea or loose stools.   metFORMIN 500 MG tablet Commonly known as:   GLUCOPHAGE Take 1 tablet (500 mg total) by mouth 2 (two) times daily with a meal. What changed:    when to take this  additional instructions   metolazone 2.5 MG tablet Commonly known as:  ZAROXOLYN Take 2.5 mg by mouth. Take on Mondays, Wednesdays and Fridays   temazepam 7.5 MG capsule Commonly known as:  RESTORIL Take 15 mg by mouth at bedtime.   traZODone 100 MG tablet Commonly known as:  DESYREL Take 200 mg by mouth at bedtime.   TRIAMCINOLONE ACETONIDE(NASAL) NA Place 2 sprays into the nose every evening.      Allergies  Allergen Reactions  . Tubersol [Tuberculin] Other (See Comments)    Reaction unknown  . Vancomycin Other (See Comments)    Red Man Syndrome  . Doxycycline Rash   Follow-up Information    [facility].   Specialty:  Home Health Services Why:  They will continue to do your home health care  at your home Contact information: [address] [address] [phone]        [facility].   Specialty:  General Practice Contact information: [place] [address] [phone]            The results of significant diagnostics from this hospitalization (including imaging, microbiology, ancillary and laboratory) are listed below for reference.    Significant Diagnostic Studies: Dg Chest 2 View  Result Date: 01/14/2018 CLINICAL DATA:  Dyspnea for 1 week. History of diabetes, hypertension and congestive heart failure. EXAM: CHEST - 2 VIEW COMPARISON:  Chest radiograph 01/12/2018.  CT 01/13/2018. FINDINGS: There is stable cardiac enlargement. The mediastinal contours are stable. Mild central airway thickening and atelectasis at both bases are stable. There is no edema, confluent airspace opacity, pleural effusion or pneumothorax. Mild degenerative changes are present throughout the thoracic spine. IMPRESSION: Stable chest without acute findings. Cardiomegaly and mild bibasilar atelectasis.  Electronically Signed   By: Richardean Sale M.D.   On: 01/14/2018 09:17   Dg Chest 2 View  Result Date: 01/12/2018 CLINICAL DATA:  Initial evaluation for acute shortness of breath. History of CHF and COPD. EXAM: CHEST - 2 VIEW COMPARISON:  Prior radiograph from 01/31/2017. FINDINGS: Stable cardiomegaly. Mediastinal silhouette normal. Aortic atherosclerosis. Lungs normally inflated. Chronic bronchitic changes, consistent with history of COPD. No consolidative airspace opacity to suggest pneumonia. No pulmonary edema or pleural effusion. No pneumothorax. No acute osseous abnormality. Degenerative changes noted throughout the visualized spine. IMPRESSION: 1. Chronic changes related to COPD. No focal infiltrates or other active cardiopulmonary disease. 2. Stable cardiomegaly without pulmonary edema. Electronically Signed   By: Jeannine Boga M.D.   On: 01/12/2018 22:33   Ct Angio Chest Pe W Or Wo Contrast  Result Date: 01/13/2018 CLINICAL DATA:  Acute respiratory failure with hypoxia. CHF. Elevated D-dimer. EXAM: CT ANGIOGRAPHY CHEST WITH CONTRAST TECHNIQUE: Multidetector CT imaging of the chest was performed using the standard protocol during bolus administration of intravenous contrast. Multiplanar CT image reconstructions and MIPs were obtained to evaluate the vascular anatomy. CONTRAST:  15m ISOVUE-370 IOPAMIDOL (ISOVUE-370) INJECTION 76% COMPARISON:  Chest radiograph from one day prior. FINDINGS: Cardiovascular: The study is moderate quality for the evaluation of pulmonary embolism, limited by suboptimal contrast opacification of the pulmonary arterial tree. There are no filling defects in the central, lobar, segmental or subsegmental pulmonary artery branches to suggest acute pulmonary embolism. Atherosclerotic nonaneurysmal thoracic aorta. Normal caliber pulmonary arteries. Mild cardiomegaly. No significant pericardial fluid/thickening. Three-vessel coronary atherosclerosis. Mediastinum/Nodes: No  discrete thyroid nodules. Unremarkable esophagus. No pathologically enlarged axillary, mediastinal or hilar lymph nodes. Lungs/Pleura: No pneumothorax. No pleural effusion. No acute consolidative airspace disease, lung masses or significant pulmonary nodules. Hypoventilatory changes in the dependent lungs. Upper abdomen: Simple liver cysts, largest 3.0 cm in the anterior liver. Musculoskeletal: No aggressive appearing focal osseous lesions. Moderate thoracic spondylosis. Review of the MIP images confirms the above findings. IMPRESSION: 1. No evidence of pulmonary embolism. 2. Hypoventilatory changes in the lungs, with otherwise no active pulmonary disease. 3. Mild cardiomegaly.  Three-vessel coronary atherosclerosis. Aortic Atherosclerosis (ICD10-I70.0). Electronically Signed   By: JIlona SorrelM.D.   On: 01/13/2018 14:32    Microbiology: No results found for this or any previous visit (from the past 240 hour(s)).   Labs: Basic Metabolic Panel: Recent Labs  Lab 01/13/18 0519 01/16/18 0621 01/16/18 1329 01/17/18 0529 01/18/18 0556  NA 132* 133* 129* 131* 132*  K 3.4* 3.5 3.7 3.1*  3.5  CL 89* 89* 86* 87* 90*  CO2 _0 GLUCOSE 138* 126* 206* 128* 117*  BUN 24* 30* 34* 38* 34*  CREATININE 1.07 1.22 1.35* 1.28* 1.05  CALCIUM 8.8* 9.4 9.2 9.2 9.1  MG 1.7  --   --   --   --    Liver Function Tests: Recent Labs  Lab 01/13/18 0519  AST 15  ALT 21  ALKPHOS 59  BILITOT 0.8  PROT 6.4*  ALBUMIN 3.4*   No results for input(s): LIPASE, AMYLASE in the last 168 hours. No results for input(s): AMMONIA in the last 168 hours. CBC: Recent Labs  Lab 01/12/18 2242 01/12/18 2246 01/13/18 0519 01/17/18 0529 01/18/18 0556  WBC 12.7*  --  11.8* 13.8* 12.4*  NEUTROABS  --   --  8.7*  --   --   HGB 13.0 13.6 12.9* 14.1 14.1  HCT 39.5 40.0 39.8 42.9 43.1  MCV 86.6  --  88.1 86.1 87.2  PLT 323  --  316 384 361   Cardiac Enzymes: Recent Labs  Lab 01/13/18 0519 01/13/18 0646  01/13/18 1210 01/13/18 1900  TROPONINI <0.03 <0.03 <0.03 <0.03   BNP: BNP (last 3 results) Recent Labs    01/31/17 1048 11/08/17 1012 01/12/18 2242  BNP 17.8 28.6 29.3    ProBNP (last 3 results) No results for input(s): PROBNP in the last 8760 hours.  CBG: Recent Labs  Lab 01/17/18 0729 01/17/18 1123 01/17/18 1707 01/17/18 2115 01/18/18 0711  GLUCAP 133* 202* 204* 162* 134*       Signed:  Domenic Polite MD.  [facility] 01/18/2018, 3:55 PM

## 2018-01-18 NOTE — Progress Notes (Signed)
Reviewed discharge instructions/medications with patient and patient's girlfriend.  Answered their questions. Pt is stable and ready for discharge. Patient is waiting on transport to take him home.

## 2018-01-18 NOTE — Progress Notes (Signed)
Physical Therapy Treatment Patient Details Name: Lawrence [name] MRN: [id number] DOB: Feb 22, 1956 Today's Date: 01/18/2018    History of Present Illness Pt is a 61 y.o. male admitted 01/12/18 with worsening SOB and BLE edema; worked up for CHF. Chest CT shows coronary artery calcifications; poor candidate for ischemic evaluation due to morbid obesity. Also with recent LE cellulitis. PMH includes diastolic CHF, HTN, DM, OSA, arthritis, L TKA (2x).    PT Comments    Patient progressing with therapy this visit, ambulating short distances in hospital room with stand by assistance. Session focused on reinforcing energy conservation and practicing sit<>stand transfers with rollator to maximize safety at home setting. Patient with increase in gout pain and limited this visit, however able to safely ambulate household distances. Discussed safety considerations with pt, demonstrates good safety awareness at this time.     Follow Up Recommendations  Home health PT;Supervision for mobility/OOB     Equipment Recommendations  None recommended by PT    Recommendations for Other Services       Precautions / Restrictions Restrictions Weight Bearing Restrictions: No    Mobility  Bed Mobility Overal bed mobility: Needs Assistance Bed Mobility: Supine to Sit              Transfers Overall transfer level: Needs assistance Equipment used: 4-wheeled walker Transfers: Sit to/from Stand Sit to Stand: Min guard;From elevated surface            Ambulation/Gait Ambulation/Gait assistance: Min guard Ambulation Distance (Feet): 15 Feet Assistive device: 4-wheeled walker Gait Pattern/deviations: Step-to pattern;Wide base of support Gait velocity: Decreased   General Gait Details: ambulating in hospital room with min guard close supervision    Stairs             Wheelchair Mobility    Modified Rankin (Stroke Patients Only)       Balance Overall balance assessment: Needs  assistance   Sitting balance-Leahy Scale: Fair       Standing balance-Leahy Scale: Poor Standing balance comment: Reliant on UE support                            Cognition Arousal/Alertness: Awake/alert Behavior During Therapy: WFL for tasks assessed/performed Overall Cognitive Status: Within Functional Limits for tasks assessed                                        Exercises      General Comments        Pertinent Vitals/Pain Pain Assessment: Faces Faces Pain Scale: Hurts little more Pain Location: R knee Pain Descriptors / Indicators: Aching;Sore;Constant Pain Intervention(s): Limited activity within patient's tolerance;Monitored during session;Premedicated before session    Home Living                      Prior Function            PT Goals (current goals can now be found in the care plan section) Acute Rehab PT Goals Patient Stated Goal: Return home PT Goal Formulation: With patient Time For Goal Achievement: 01/30/18 Potential to Achieve Goals: Good Progress towards PT goals: Progressing toward goals    Frequency    Min 3X/week      PT Plan Current plan remains appropriate    Co-evaluation  AM-PAC PT "6 Clicks" Daily Activity  Outcome Measure  Difficulty turning over in bed (including adjusting bedclothes, sheets and blankets)?: A Little Difficulty moving from lying on back to sitting on the side of the bed? : A Little Difficulty sitting down on and standing up from a chair with arms (e.g., wheelchair, bedside commode, etc,.)?: A Little Help needed moving to and from a bed to chair (including a wheelchair)?: A Little Help needed walking in hospital room?: A Little Help needed climbing 3-5 steps with a railing? : A Lot 6 Click Score: 17    End of Session Equipment Utilized During Treatment: Gait belt Activity Tolerance: Patient tolerated treatment well;Patient limited by pain Patient  left: in chair;with call bell/phone within reach;with family/visitor present Nurse Communication: Mobility status PT Visit Diagnosis: Other abnormalities of gait and mobility (R26.89)     Time: 0830-0900 PT Time Calculation (min) (ACUTE ONLY): 30 min  Charges:  $Gait Training: 8-22 mins $Therapeutic Activity: 8-22 mins                    G Codes:       Lawrence [name], PT, DPT Acute Rehab Services Pager: [facility] 01/18/2018, 9:25 AM

## 2018-01-18 NOTE — Progress Notes (Signed)
Patient is for discharge home today; [place] arranged with [facility]; transportation to be provided by [facility] non emergent ambulance; home address verified with patient. Paperwork for ambulance placed on shadow chart; Aneta Mins [phone]

## 2018-02-15 ENCOUNTER — Encounter (HOSPITAL_COMMUNITY): Payer: Self-pay

## 2018-02-15 ENCOUNTER — Emergency Department (HOSPITAL_COMMUNITY)
Admission: EM | Admit: 2018-02-15 | Discharge: 2018-02-16 | Disposition: A | Discharge status: Home / Self Care | Payer: Non-veteran care | Attending: Emergency Medicine | Admitting: Emergency Medicine

## 2018-02-15 ENCOUNTER — Other Ambulatory Visit: Payer: Self-pay

## 2018-02-15 ENCOUNTER — Emergency Department (HOSPITAL_COMMUNITY): Payer: Non-veteran care

## 2018-02-15 DIAGNOSIS — Z87891 Personal history of nicotine dependence: Secondary | ICD-10-CM | POA: Insufficient documentation

## 2018-02-15 DIAGNOSIS — I509 Heart failure, unspecified: Secondary | ICD-10-CM | POA: Insufficient documentation

## 2018-02-15 DIAGNOSIS — E119 Type 2 diabetes mellitus without complications: Secondary | ICD-10-CM | POA: Insufficient documentation

## 2018-02-15 DIAGNOSIS — Z79899 Other long term (current) drug therapy: Secondary | ICD-10-CM | POA: Diagnosis not present

## 2018-02-15 DIAGNOSIS — I110 Hypertensive heart disease with heart failure: Secondary | ICD-10-CM | POA: Insufficient documentation

## 2018-02-15 DIAGNOSIS — R0789 Other chest pain: Secondary | ICD-10-CM | POA: Insufficient documentation

## 2018-02-15 DIAGNOSIS — T43611A Poisoning by caffeine, accidental (unintentional), initial encounter: Secondary | ICD-10-CM | POA: Insufficient documentation

## 2018-02-15 DIAGNOSIS — R079 Chest pain, unspecified: Secondary | ICD-10-CM

## 2018-02-15 DIAGNOSIS — Z7984 Long term (current) use of oral hypoglycemic drugs: Secondary | ICD-10-CM | POA: Diagnosis not present

## 2018-02-15 LAB — CBC
HCT: 38.6 % — ABNORMAL LOW (ref 39.0–52.0)
Hemoglobin: 12.7 g/dL — ABNORMAL LOW (ref 13.0–17.0)
MCH: 28.7 pg (ref 26.0–34.0)
MCHC: 32.9 g/dL (ref 30.0–36.0)
MCV: 87.3 fL (ref 78.0–100.0)
Platelets: 374 10*3/uL (ref 150–400)
RBC: 4.42 MIL/uL (ref 4.22–5.81)
RDW: 14.3 % (ref 11.5–15.5)
WBC: 15.9 10*3/uL — ABNORMAL HIGH (ref 4.0–10.5)

## 2018-02-15 LAB — BASIC METABOLIC PANEL
Anion gap: 14 (ref 5–15)
BUN: 37 mg/dL — ABNORMAL HIGH (ref 6–20)
CO2: 29 mmol/L (ref 22–32)
Calcium: 8.9 mg/dL (ref 8.9–10.3)
Chloride: 88 mmol/L — ABNORMAL LOW (ref 98–111)
Creatinine, Ser: 1.41 mg/dL — ABNORMAL HIGH (ref 0.61–1.24)
GFR calc Af Amer: >60 mL/min (ref >60)
GFR calc non Af Amer: 53 mL/min — ABNORMAL LOW (ref >60)
Glucose, Bld: 147 mg/dL — ABNORMAL HIGH (ref 70–99)
Potassium: 3.4 mmol/L — ABNORMAL LOW (ref 3.5–5.1)
Sodium: 131 mmol/L — ABNORMAL LOW (ref 135–145)

## 2018-02-15 LAB — I-STAT TROPONIN, ED: Troponin i, poc: 0.01 ng/mL (ref 0.00–0.08)

## 2018-02-15 MED ORDER — TEMAZEPAM 15 MG PO CAPS: 15.0000 mg | ORAL_CAPSULE | Freq: Every day | ORAL | Status: DC

## 2018-02-15 MED ORDER — TRAZODONE HCL 50 MG PO TABS
200.0000 mg | ORAL_TABLET | Freq: Every day | ORAL | Status: DC
  Administered 2018-02-15: 200 mg via ORAL
  Filled 2018-02-15: qty 4

## 2018-02-15 MED ORDER — GABAPENTIN 300 MG PO CAPS
300.0000 mg | ORAL_CAPSULE | ORAL | Status: DC
  Administered 2018-02-15: 600 mg via ORAL
  Filled 2018-02-15: qty 2

## 2018-02-15 NOTE — ED Triage Notes (Signed)
Patient from home with increased shortness of breath and chest pain.  Stated he has had this before and the hospital staff told him if it happens to call the paramedics.  Patient wears CPAP at night.  Patient took 324 ASA prior to arrival of EMS.  Vitals Stable.

## 2018-02-15 NOTE — ED Provider Notes (Signed)
[facility] EMERGENCY DEPARTMENT Provider Note   CSN: [id number] Arrival date & time: 02/15/18  2144     History   Chief Complaint Chief Complaint  Patient presents with  . Chest Pain    HPI Lawrence [name] is a 62 y.o. male.  Pt presents to the ED today with cp and sob.  The pt said he drank a triple shot of [employer] coffee this evening around 1600 and felt sob about an hour later.  The pt has a hx of chf and was recently in the hospital for chf (6/7-6/13).  While here, he did have a cath which showed nonobstructive cad.  The pt said he gets sob about once a week and normally he can lay down with his bipap and he gets better.  He did not tonight, so he called his home health nurse who told him to come to the ED.  The pt is feeling better than he did, but still not totally normal.     Past Medical History:  Diagnosis Date  . Anxiety   . Arthritis   . CHF (congestive heart failure) ([place])   . Chronic cough   . Depression   . Hypertension   . New onset type 2 diabetes mellitus ([place]) 11/08/2017  . PTSD (post-traumatic stress disorder)   . Sleep apnea     Patient Active Problem List   Diagnosis Date Noted  . Coronary artery calcification seen on CAT scan   . Pure hypercholesterolemia   . Atypical chest pain   . Shortness of breath   . Acute respiratory failure with hypoxia ([place]) 01/13/2018  . Right foot pain 01/13/2018  . Acute on chronic diastolic CHF (congestive heart failure) ([place]) 01/13/2018  . Diabetes mellitus type 2 in obese ([place]) 01/13/2018  . New onset type 2 diabetes mellitus ([place]) 11/08/2017  . Cellulitis 11/07/2017  . Primary osteoarthritis, left shoulder 03/05/2017  . Essential hypertension, benign 02/22/2017  . BPH without obstruction/lower urinary tract symptoms 02/22/2017  . Hematuria 02/22/2017  . Peripheral neuropathy 02/22/2017  . Physical deconditioning 02/22/2017  . Morbid obesity ([place])   . PTSD (post-traumatic stress disorder)     . Anasarca 01/31/2017  . Bilateral lower extremity edema 01/31/2017  . Cellulitis of lower extremity   . OSA (obstructive sleep apnea)     Past Surgical History:  Procedure Laterality Date  . HERNIA REPAIR    . JOINT REPLACEMENT     left knee replacement x 2  . KNEE ARTHROSCOPY    . LEFT HEART CATH AND CORONARY ANGIOGRAPHY N/A 01/17/2018   Procedure: LEFT HEART CATH AND CORONARY ANGIOGRAPHY;  Surgeon: Leonie Man, MD;  Location: [facility];  Service: Cardiovascular;  Laterality: N/A;        Home Medications    Prior to Admission medications   Medication Sig Start Date End Date Taking? Authorizing Provider  acetaminophen (TYLENOL) 500 MG tablet Take 1,000 mg by mouth 3 (three) times daily.    Yes [provider]  allopurinol (ZYLOPRIM) 100 MG tablet Take 1 tablet (100 mg total) by mouth daily. 01/18/18  Yes Domenic Polite, MD  ARIPiprazole (ABILIFY) 5 MG tablet Take 5 mg by mouth at bedtime.    Yes [provider]  aspirin EC 81 MG EC tablet Take 1 tablet (81 mg total) by mouth daily. 01/19/18  Yes Domenic Polite, MD  atorvastatin (LIPITOR) 40 MG tablet Take 1 tablet (40 mg total) by mouth daily at 6 PM. 01/18/18  Yes Domenic Polite, MD  cetirizine (ZYRTEC) 10 MG tablet Take 10 mg by mouth daily.   Yes [provider]  fluticasone (FLONASE) 50 MCG/ACT nasal spray Place 1 spray into both nostrils daily.    Yes [provider]  furosemide (LASIX) 80 MG tablet Take 80 mg by mouth 2 (two) times daily.   Yes [provider]  gabapentin (NEURONTIN) 300 MG capsule Take 300-600 mg by mouth See admin instructions. Take 1 capsule every morning and at noon then take 2 capsules at bedtime   Yes [provider]  lisinopril (PRINIVIL,ZESTRIL) 10 MG tablet Take 10 mg by mouth daily.   Yes [provider]  loperamide (IMODIUM) 2 MG capsule Take 2 mg by mouth as needed for diarrhea or loose stools.   Yes [provider]  metFORMIN (GLUCOPHAGE) 500 MG tablet Take 1 tablet (500 mg total) by mouth 2 (two) times daily with a meal. 11/09/17 11/09/18 Yes Eugenie Filler, MD  metolazone (ZAROXOLYN) 2.5 MG tablet Take 2.5 mg by mouth every Monday, Wednesday, and Friday.    Yes [provider]  temazepam (RESTORIL) 7.5 MG capsule Take 15 mg by mouth at bedtime.    Yes [provider]  traZODone (DESYREL) 100 MG tablet Take 200 mg by mouth at bedtime.   Yes [provider]  TRIAMCINOLONE ACETONIDE,NASAL, NA Place 2 sprays into the nose every evening.    Yes [provider]  Trolamine Salicylate (ASPERCREME EX) Apply 1 application topically 2 (two) times daily as needed (pain and inflammation).   Yes [provider]  blood glucose meter kit and supplies Dispense based on patient and insurance preference. Use up to four times daily as directed. (FOR ICD-10 E10.9, E11.9). 11/09/17   Eugenie Filler, MD    Family History Family History  Problem Relation Age of Onset  . CAD Maternal Grandfather   . Diabetes Mellitus II Neg Hx     Social History Social History   Tobacco Use  . Smoking status: Former Smoker    Last attempt to quit: 03/07/2016    Years since quitting: 1.9  . Smokeless tobacco: Never Used  Substance Use Topics  . Alcohol use: Yes    Comment: rare  . Drug use: No     Allergies   Tubersol [tuberculin]; Vancomycin; and Doxycycline   Review of Systems Review of Systems  Respiratory: Positive for shortness of breath.   Cardiovascular: Positive for chest pain.  All other systems reviewed and are negative.    Physical Exam Updated Vital Signs BP 110/66   Pulse 97   Temp 98.8 F (37.1 C) (Oral)   Resp 15   Ht '6\' 2"'$  (1.88 m)   Wt (!) 176.4 kg (389 lb)   SpO2 92%   BMI 49.94 kg/m   Physical Exam  Constitutional: He is oriented to person, place, and time. He appears well-developed and well-nourished.  HENT:  Head: Normocephalic and  atraumatic.  Eyes: Pupils are equal, round, and reactive to light. EOM are normal.  Neck: Normal range of motion. Neck supple.  Cardiovascular: Normal rate, regular rhythm and normal pulses.  Pulmonary/Chest: Effort normal and breath sounds normal.  Musculoskeletal:       Right lower leg: He exhibits edema.       Left lower leg: He exhibits edema.  Chronic skin changes bilateral le.  (normal per family)  Neurological: He is alert and oriented to person, place, and time.  Skin: Skin is  warm. Capillary refill takes less than 2 seconds.  Psychiatric: He has a normal mood and affect. His behavior is normal.  Nursing note and vitals reviewed.    ED Treatments / Results  Labs (all labs ordered are listed, but only abnormal results are displayed) Labs Reviewed  BASIC METABOLIC PANEL - Abnormal; Notable for the following components:      Result Value   Sodium 131 (*)    Potassium 3.4 (*)    Chloride 88 (*)    Glucose, Bld 147 (*)    BUN 37 (*)    Creatinine, Ser 1.41 (*)    GFR calc non Af Amer 53 (*)    All other components within normal limits  CBC - Abnormal; Notable for the following components:   WBC 15.9 (*)    Hemoglobin 12.7 (*)    HCT 38.6 (*)    All other components within normal limits  I-STAT TROPONIN, ED    EKG EKG Interpretation  Date/Time:  Thursday February 15 2018 21:44:10 EDT Ventricular Rate:  99 PR Interval:    QRS Duration: 114 QT Interval:  377 QTC Calculation: 484 R Axis:   39 Text Interpretation:  Sinus tachycardia Ventricular premature complex Borderline prolonged PR interval Incomplete right bundle branch block Low voltage, extremity and precordial leads Consider anterior infarct No significant change since last tracing Confirmed by Isla Pence [phone]) on 02/15/2018 10:36:47 PM   Radiology Dg Chest 2 View  Result Date: 02/15/2018 CLINICAL DATA:  Chest pain, dyspnea and swelling of the legs. EXAM: CHEST - 2 VIEW COMPARISON:  01/14/2018 FINDINGS:  Stable cardiomegaly and mediastinal contours. No acute pulmonary consolidation, CHF, effusion or pneumothorax. Degenerative changes are present along the dorsal spine. IMPRESSION: No active cardiopulmonary disease.  Stable cardiomegaly. Electronically Signed   By: Ashley Royalty M.D.   On: 02/15/2018 22:29    Procedures Procedures (including critical care time)  Medications Ordered in ED Medications  gabapentin (NEURONTIN) capsule 300-600 mg (has no administration in time range)  temazepam (RESTORIL) capsule 15 mg (has no administration in time range)  traZODone (DESYREL) tablet 200 mg (has no administration in time range)     Initial Impression / Assessment and Plan / ED Course  I have reviewed the triage vital signs and the nursing notes.  Pertinent labs & imaging results that were available during my care of the patient were reviewed by me and considered in my medical decision making (see chart for details).  I think sx are likely due to caffeine intake.  Cath last month with nonobstructive cad.  No chf on cxr.  Oxygenation good.  Pt is stable for d/c.  Return if worse.      Final Clinical Impressions(s) / ED Diagnoses   Final diagnoses:  Nonspecific chest pain  Accidental caffeine overdose, initial encounter [facility])    ED Discharge Orders    None       Isla Pence, MD 02/15/18 2321

## 2018-02-15 NOTE — ED Notes (Addendum)
Light green, lavender, blue, and dark green tops collected. Dark green top on rocker in [employer]. The rest were sent to Main Lab.  Pt instructed to use urinal to provide UA. Pt verbalized understanding.

## 2018-02-15 NOTE — ED Notes (Signed)
[facility] called for transport.  

## 2018-02-15 NOTE — Discharge Instructions (Addendum)
Avoid caffeine.  Return if worse.

## 2018-07-26 ENCOUNTER — Encounter: Payer: Self-pay | Admitting: Gastroenterology

## 2018-08-17 ENCOUNTER — Emergency Department (HOSPITAL_COMMUNITY): Payer: Medicaid Other

## 2018-08-17 ENCOUNTER — Observation Stay (HOSPITAL_COMMUNITY)
Admission: EM | Admit: 2018-08-17 | Discharge: 2018-08-19 | Disposition: A | Discharge status: Home / Self Care | Payer: Medicaid Other | Attending: Family Medicine | Admitting: Family Medicine

## 2018-08-17 ENCOUNTER — Encounter (HOSPITAL_COMMUNITY): Payer: Self-pay | Admitting: Emergency Medicine

## 2018-08-17 DIAGNOSIS — I5032 Chronic diastolic (congestive) heart failure: Secondary | ICD-10-CM | POA: Diagnosis present

## 2018-08-17 DIAGNOSIS — L03119 Cellulitis of unspecified part of limb: Secondary | ICD-10-CM | POA: Diagnosis present

## 2018-08-17 DIAGNOSIS — I110 Hypertensive heart disease with heart failure: Secondary | ICD-10-CM | POA: Diagnosis not present

## 2018-08-17 DIAGNOSIS — E669 Obesity, unspecified: Secondary | ICD-10-CM

## 2018-08-17 DIAGNOSIS — Z79899 Other long term (current) drug therapy: Secondary | ICD-10-CM | POA: Diagnosis not present

## 2018-08-17 DIAGNOSIS — E1142 Type 2 diabetes mellitus with diabetic polyneuropathy: Secondary | ICD-10-CM | POA: Diagnosis not present

## 2018-08-17 DIAGNOSIS — R0602 Shortness of breath: Secondary | ICD-10-CM | POA: Diagnosis present

## 2018-08-17 DIAGNOSIS — F4310 Post-traumatic stress disorder, unspecified: Secondary | ICD-10-CM | POA: Diagnosis not present

## 2018-08-17 DIAGNOSIS — Z66 Do not resuscitate: Secondary | ICD-10-CM | POA: Diagnosis not present

## 2018-08-17 DIAGNOSIS — E7800 Pure hypercholesterolemia, unspecified: Secondary | ICD-10-CM | POA: Diagnosis present

## 2018-08-17 DIAGNOSIS — L03116 Cellulitis of left lower limb: Secondary | ICD-10-CM | POA: Diagnosis not present

## 2018-08-17 DIAGNOSIS — F329 Major depressive disorder, single episode, unspecified: Secondary | ICD-10-CM | POA: Diagnosis not present

## 2018-08-17 DIAGNOSIS — M109 Gout, unspecified: Secondary | ICD-10-CM | POA: Diagnosis not present

## 2018-08-17 DIAGNOSIS — L03115 Cellulitis of right lower limb: Secondary | ICD-10-CM | POA: Insufficient documentation

## 2018-08-17 DIAGNOSIS — E1169 Type 2 diabetes mellitus with other specified complication: Secondary | ICD-10-CM | POA: Diagnosis present

## 2018-08-17 DIAGNOSIS — E785 Hyperlipidemia, unspecified: Secondary | ICD-10-CM | POA: Insufficient documentation

## 2018-08-17 DIAGNOSIS — Z7982 Long term (current) use of aspirin: Secondary | ICD-10-CM | POA: Insufficient documentation

## 2018-08-17 DIAGNOSIS — R079 Chest pain, unspecified: Principal | ICD-10-CM | POA: Diagnosis present

## 2018-08-17 DIAGNOSIS — I10 Essential (primary) hypertension: Secondary | ICD-10-CM | POA: Diagnosis present

## 2018-08-17 DIAGNOSIS — Z7984 Long term (current) use of oral hypoglycemic drugs: Secondary | ICD-10-CM | POA: Insufficient documentation

## 2018-08-17 DIAGNOSIS — E119 Type 2 diabetes mellitus without complications: Secondary | ICD-10-CM | POA: Diagnosis present

## 2018-08-17 DIAGNOSIS — Z87891 Personal history of nicotine dependence: Secondary | ICD-10-CM | POA: Diagnosis not present

## 2018-08-17 DIAGNOSIS — G4733 Obstructive sleep apnea (adult) (pediatric): Secondary | ICD-10-CM | POA: Insufficient documentation

## 2018-08-17 DIAGNOSIS — M79604 Pain in right leg: Secondary | ICD-10-CM | POA: Insufficient documentation

## 2018-08-17 HISTORY — DX: Other specified postprocedural states: Z98.890

## 2018-08-17 HISTORY — DX: Morbid (severe) obesity due to excess calories: E66.01

## 2018-08-17 HISTORY — DX: Chronic diastolic (congestive) heart failure: I50.32

## 2018-08-17 LAB — I-STAT TROPONIN, ED: Troponin i, poc: 0.02 ng/mL (ref 0.00–0.08)

## 2018-08-17 LAB — CBC WITH DIFFERENTIAL/PLATELET
Abs Immature Granulocytes: 0.1 10*3/uL — ABNORMAL HIGH (ref 0.00–0.07)
Basophils Absolute: 0 10*3/uL (ref 0.0–0.1)
Basophils Relative: 0 %
Eosinophils Absolute: 0.4 10*3/uL (ref 0.0–0.5)
Eosinophils Relative: 3 %
HCT: 40.6 % (ref 39.0–52.0)
Hemoglobin: 12.8 g/dL — ABNORMAL LOW (ref 13.0–17.0)
Immature Granulocytes: 1 %
Lymphocytes Relative: 12 %
Lymphs Abs: 1.6 10*3/uL (ref 0.7–4.0)
MCH: 27.7 pg (ref 26.0–34.0)
MCHC: 31.5 g/dL (ref 30.0–36.0)
MCV: 87.9 fL (ref 80.0–100.0)
Monocytes Absolute: 0.8 10*3/uL (ref 0.1–1.0)
Monocytes Relative: 6 %
Neutro Abs: 10.4 10*3/uL — ABNORMAL HIGH (ref 1.7–7.7)
Neutrophils Relative %: 78 %
Platelets: 326 10*3/uL (ref 150–400)
RBC: 4.62 MIL/uL (ref 4.22–5.81)
RDW: 13.9 % (ref 11.5–15.5)
WBC: 13.4 10*3/uL — ABNORMAL HIGH (ref 4.0–10.5)
nRBC: 0 % (ref 0.0–0.2)

## 2018-08-17 LAB — BASIC METABOLIC PANEL
Anion gap: 14 (ref 5–15)
BUN: 15 mg/dL (ref 8–23)
CO2: 23 mmol/L (ref 22–32)
Calcium: 9.2 mg/dL (ref 8.9–10.3)
Chloride: 98 mmol/L (ref 98–111)
Creatinine, Ser: 0.86 mg/dL (ref 0.61–1.24)
GFR calc Af Amer: >60 mL/min (ref >60)
GFR calc non Af Amer: >60 mL/min (ref >60)
Glucose, Bld: 158 mg/dL — ABNORMAL HIGH (ref 70–99)
Potassium: 4.1 mmol/L (ref 3.5–5.1)
Sodium: 135 mmol/L (ref 135–145)

## 2018-08-17 LAB — I-STAT VENOUS BLOOD GAS, ED
Acid-Base Excess: 5 mmol/L — ABNORMAL HIGH (ref 0.0–2.0)
Bicarbonate: 30.9 mmol/L — ABNORMAL HIGH (ref 20.0–28.0)
O2 Saturation: 71 %
Patient temperature: 99
TCO2: 32 mmol/L (ref 22–32)
pCO2, Ven: 48.1 mmHg (ref 44.0–60.0)
pH, Ven: 7.417 (ref 7.250–7.430)
pO2, Ven: 38 mmHg (ref 32.0–45.0)

## 2018-08-17 LAB — BRAIN NATRIURETIC PEPTIDE: B Natriuretic Peptide: 49.8 pg/mL (ref 0.0–100.0)

## 2018-08-17 MED ORDER — FENTANYL CITRATE (PF) 100 MCG/2ML IJ SOLN
50.0000 ug | Freq: Once | INTRAMUSCULAR | Status: AC
  Administered 2018-08-17: 50 ug via INTRAVENOUS
  Filled 2018-08-17: qty 2

## 2018-08-17 MED ORDER — IOPAMIDOL (ISOVUE-370) INJECTION 76%
INTRAVENOUS | Status: AC
  Administered 2018-08-17: 100 mL
  Filled 2018-08-17: qty 100

## 2018-08-17 NOTE — ED Provider Notes (Signed)
[facility] EMERGENCY DEPARTMENT Provider Note   CSN: [id number] Arrival date & time: 08/17/18  1807     History   Chief Complaint Chief Complaint  Patient presents with  . Chest Pain    HPI Lawrence [name] is a 63 y.o. male.  The history is provided by the patient and medical records. No language interpreter was used.  Chest Pain  Associated symptoms: shortness of breath   Associated symptoms: no cough and no fever    Lawrence [name] is a 63 y.o. male  with a PMH of HTN, DM, CHF, CAD who presents to the Emergency Department complaining of acute onset of central left-sided chest tightness which began about 10 AM this morning.  He did feel as if he got the sweats when this started.  He bent his outside door to let the cool area and this seemed to help.  He denies having any history of similar sensation.  The pain seemed to come and go throughout the day.  He is unsure if there was truly an exertional component.  He does not believe so.  He does report associated shortness of breath when the pain occurs. Pain is described as a sharpness.  There is no radiation of the pain.  About 430 or 5:00, it acutely worsened and was at its most severe.  He called EMS who gave him 324 of aspirin as well as a nitro.  He reports the nitro helped to bring his pain from a 10/10 down to an 8/10.  He was then given 6 mg of morphine and another nitroglycerin.  He feels as if his pain is much better, but still present.  Denies any acute worsening of his chronic lower extremity edema.  He is not typically on oxygen at home. 3 pillow orthopnea at baseline over the last several months. He does not believe that he has had a cardiac event in the past.  He does state that he has had 2 stress tests through the [place].  One was several years ago prior to a knee replacement which he states was fine.  His most recent he believes was in 2012.  He is unsure of those results, but states that they did not do any  further invasive testing after treadmill test.  Per chart review, he actually did have a heart cath in June 2019.  Proximal LAD 40% stenosis, proximal circumflex 20% stenosed.   Past Medical History:  Diagnosis Date  . Anxiety   . Arthritis   . CHF (congestive heart failure) ([place])   . Chronic cough   . Depression   . Hypertension   . New onset type 2 diabetes mellitus ([place]) 11/08/2017  . PTSD (post-traumatic stress disorder)   . Sleep apnea     Patient Active Problem List   Diagnosis Date Noted  . Coronary artery calcification seen on CAT scan   . Pure hypercholesterolemia   . Atypical chest pain   . Shortness of breath   . Acute respiratory failure with hypoxia ([place]) 01/13/2018  . Right foot pain 01/13/2018  . Acute on chronic diastolic CHF (congestive heart failure) ([place]) 01/13/2018  . Diabetes mellitus type 2 in obese ([place]) 01/13/2018  . New onset type 2 diabetes mellitus ([place]) 11/08/2017  . Cellulitis 11/07/2017  . Primary osteoarthritis, left shoulder 03/05/2017  . Essential hypertension, benign 02/22/2017  . BPH without obstruction/lower urinary tract symptoms 02/22/2017  . Hematuria 02/22/2017  . Peripheral neuropathy 02/22/2017  . Physical deconditioning 02/22/2017  .  Morbid obesity ([place])   . PTSD (post-traumatic stress disorder)   . Anasarca 01/31/2017  . Bilateral lower extremity edema 01/31/2017  . Cellulitis of lower extremity   . OSA (obstructive sleep apnea)     Past Surgical History:  Procedure Laterality Date  . HERNIA REPAIR    . JOINT REPLACEMENT     left knee replacement x 2  . KNEE ARTHROSCOPY    . LEFT HEART CATH AND CORONARY ANGIOGRAPHY N/A 01/17/2018   Procedure: LEFT HEART CATH AND CORONARY ANGIOGRAPHY;  Surgeon: Leonie Man, MD;  Location: [facility];  Service: Cardiovascular;  Laterality: N/A;        Home Medications    Prior to Admission medications   Medication Sig Start Date End Date Taking? Authorizing Provider    acetaminophen (TYLENOL) 500 MG tablet Take 1,000 mg by mouth 3 (three) times daily.     [provider]  allopurinol (ZYLOPRIM) 100 MG tablet Take 1 tablet (100 mg total) by mouth daily. 01/18/18   Domenic Polite, MD  ARIPiprazole (ABILIFY) 5 MG tablet Take 5 mg by mouth at bedtime.     [provider]  aspirin EC 81 MG EC tablet Take 1 tablet (81 mg total) by mouth daily. 01/19/18   Domenic Polite, MD  atorvastatin (LIPITOR) 40 MG tablet Take 1 tablet (40 mg total) by mouth daily at 6 PM. 01/18/18   Domenic Polite, MD  blood glucose meter kit and supplies Dispense based on patient and insurance preference. Use up to four times daily as directed. (FOR ICD-10 E10.9, E11.9). 11/09/17   Eugenie Filler, MD  cetirizine (ZYRTEC) 10 MG tablet Take 10 mg by mouth daily.    [provider]  fluticasone (FLONASE) 50 MCG/ACT nasal spray Place 1 spray into both nostrils daily.     [provider]  furosemide (LASIX) 80 MG tablet Take 80 mg by mouth 2 (two) times daily.    [provider]  gabapentin (NEURONTIN) 300 MG capsule Take 300-600 mg by mouth See admin instructions. Take 1 capsule every morning and at noon then take 2 capsules at bedtime    [provider]  lisinopril (PRINIVIL,ZESTRIL) 10 MG tablet Take 10 mg by mouth daily.    [provider]  loperamide (IMODIUM) 2 MG capsule Take 2 mg by mouth as needed for diarrhea or loose stools.    [provider]  metFORMIN (GLUCOPHAGE) 500 MG tablet Take 1 tablet (500 mg total) by mouth 2 (two) times daily with a meal. 11/09/17 11/09/18  Eugenie Filler, MD  metolazone (ZAROXOLYN) 2.5 MG tablet Take 2.5 mg by mouth every Monday, Wednesday, and Friday.     [provider]  temazepam (RESTORIL) 7.5 MG capsule Take 15 mg by mouth at bedtime.     [provider]  traZODone (DESYREL) 100 MG tablet Take 200 mg by mouth at bedtime.    [provider]  TRIAMCINOLONE  ACETONIDE,NASAL, NA Place 2 sprays into the nose every evening.     [provider]  Trolamine Salicylate (ASPERCREME EX) Apply 1 application topically 2 (two) times daily as needed (pain and inflammation).    [provider]    Family History Family History  Problem Relation Age of Onset  . CAD Maternal Grandfather   . Diabetes Mellitus II Neg Hx     Social History Social History   Tobacco Use  . Smoking status: Former Smoker    Last attempt to quit:  03/07/2016    Years since quitting: 2.4  . Smokeless tobacco: Never Used  Substance Use Topics  . Alcohol use: Yes    Comment: rare  . Drug use: No     Allergies   Tubersol [tuberculin]; Vancomycin; and Doxycycline   Review of Systems Review of Systems  Constitutional: Negative for chills and fever.  Respiratory: Positive for shortness of breath. Negative for cough.   Cardiovascular: Positive for chest pain and leg swelling (Chronic, baseline per patient.).  All other systems reviewed and are negative.    Physical Exam Updated Vital Signs BP 110/74   Pulse 84   Temp 99 F (37.2 C) (Oral)   Resp 20   Ht 6' 1.5" (1.867 m)   Wt (!) 183.7 kg   SpO2 97%   BMI 52.71 kg/m   Physical Exam Vitals signs and nursing note reviewed.  Constitutional:      General: He is not in acute distress.    Appearance: He is well-developed.  HENT:     Head: Normocephalic and atraumatic.  Cardiovascular:     Rate and Rhythm: Normal rate and regular rhythm.     Heart sounds: Normal heart sounds. No murmur.  Pulmonary:     Effort: Pulmonary effort is normal. No respiratory distress.     Breath sounds: Normal breath sounds.  Abdominal:     General: There is no distension.     Palpations: Abdomen is soft.     Tenderness: There is no abdominal tenderness.  Musculoskeletal:     Right lower leg: Edema present.     Left lower leg: Edema present.  Skin:    General: Skin is warm and dry.  Neurological:     Mental  Status: He is alert and oriented to person, place, and time.      ED Treatments / Results  Labs (all labs ordered are listed, but only abnormal results are displayed) Labs Reviewed  BASIC METABOLIC PANEL - Abnormal; Notable for the following components:      Result Value   Glucose, Bld 158 (*)    All other components within normal limits  CBC WITH DIFFERENTIAL/PLATELET - Abnormal; Notable for the following components:   WBC 13.4 (*)    Hemoglobin 12.8 (*)    Neutro Abs 10.4 (*)    Abs Immature Granulocytes 0.10 (*)    All other components within normal limits  I-STAT VENOUS BLOOD GAS, ED - Abnormal; Notable for the following components:   Bicarbonate 30.9 (*)    Acid-Base Excess 5.0 (*)    All other components within normal limits  BRAIN NATRIURETIC PEPTIDE  TROPONIN I  I-STAT TROPONIN, ED    EKG EKG Interpretation  Date/Time:  Friday August 17 2018 20:33:25 EST Ventricular Rate:  85 PR Interval:    QRS Duration: 118 QT Interval:  387 QTC Calculation: 461 R Axis:   5 Text Interpretation:  Sinus rhythm Atrial premature complex Nonspecific intraventricular conduction delay Low voltage, precordial leads No significant change since last tracing Confirmed by Wandra Arthurs [phone]) on 08/17/2018 8:37:02 PM   Radiology Dg Chest 2 View  Result Date: 08/17/2018 CLINICAL DATA:  Left-sided chest pain. EXAM: CHEST - 2 VIEW COMPARISON:  Chest x-ray dated February 15, 2018. FINDINGS: Stable mild cardiomegaly. Normal pulmonary vascularity. Linear subsegmental atelectasis at both lung bases. No focal consolidation, pleural effusion, or pneumothorax. No acute osseous abnormality. IMPRESSION: 1.  No active cardiopulmonary disease. 2. Mild bibasilar atelectasis. Electronically Signed   By: Huntley Dec  Derry M.D.   On: 08/17/2018 19:42   Ct Angio Chest Pe W And/or Wo Contrast  Result Date: 08/17/2018 CLINICAL DATA:  64 y/o M; worsening left-sided chest pain with shortness of breath. EXAM: CT  ANGIOGRAPHY CHEST WITH CONTRAST TECHNIQUE: Multidetector CT imaging of the chest was performed using the standard protocol during bolus administration of intravenous contrast. Multiplanar CT image reconstructions and MIPs were obtained to evaluate the vascular anatomy. CONTRAST:  174m ISOVUE-370 IOPAMIDOL (ISOVUE-370) INJECTION 76% COMPARISON:  01/13/2018 CT angiogram of the chest. FINDINGS: Cardiovascular: Stable cardiomegaly. Severe coronary artery calcific atherosclerosis. Aortic valvular calcific atherosclerosis. Normal caliber thoracic aorta and main pulmonary artery. Satisfactory opacification of the pulmonary arteries. Mediastinum/Nodes: No enlarged mediastinal, hilar, or axillary lymph nodes. Thyroid gland, trachea, and esophagus demonstrate no significant findings. Lungs/Pleura: Minor atelectasis in the lung bases. No focal consolidation. No pleural effusion or pneumothorax. Upper Abdomen: Multiple liver cysts. Right kidney interpolar cyst measuring 20 mm. Musculoskeletal: No chest wall abnormality. No acute or significant osseous findings. Review of the MIP images confirms the above findings. IMPRESSION: 1. No pulmonary embolus identified. 2. Minor atelectasis in the lung bases. 3. Stable cardiomegaly, aortic atherosclerosis, and coronary artery calcifications. Electronically Signed   By: LKristine GarbeM.D.   On: 08/17/2018 23:05    Procedures Procedures (including critical care time)  Medications Ordered in ED Medications  fentaNYL (SUBLIMAZE) injection 50 mcg (50 mcg Intravenous Given 08/17/18 2052)  iopamidol (ISOVUE-370) 76 % injection (100 mLs  Contrast Given 08/17/18 2230)     Initial Impression / Assessment and Plan / ED Course  I have reviewed the triage vital signs and the nursing notes.  Pertinent labs & imaging results that were available during my care of the patient were reviewed by me and considered in my medical decision making (see chart for details).    RAleczander Fandinois a 63y.o. male who presents to ED for acute onset of central chest pain associated with shortness of breath which began this morning at 10 AM.  On exam, patient is afebrile, hemodynamically stable.  He was given 324 of ASA as well as nitro x2 by EMS.  Nitro may have decreased his pain a little, but still 8/10.  Does not appear that it had much effect.  EKG with no significant change from prior tracing - no evidence of acute ischemia.  Troponin negative.  BNP wdl.  X-ray without any signs of active/acute process.  On reevaluation, patient is on room air and maintaining his oxygenation.  Still appears quite uncomfortable and is reporting ongoing shortness of breath. Given his persistent symptoms, proceeded to CT angio to r/o PE as etiology of his chest pain / shortness of breath. No PE on imaging. He is a heart score of 4. Does have known CAD. Hospitalist consulted who will admit.     Final Clinical Impressions(s) / ED Diagnoses   Final diagnoses:  Chest pain    ED Discharge Orders    None       Ward, JOzella Almond PA-C 08/18/18 0029    YDrenda Freeze MD 08/19/18 [phone]

## 2018-08-17 NOTE — ED Notes (Signed)
Attempted IV stick x 2  

## 2018-08-17 NOTE — ED Notes (Signed)
Pt Ward informed of venous blood gas results

## 2018-08-17 NOTE — ED Triage Notes (Signed)
Pt arrives via EMS from home with reports of CP that started at 10 am and has worsened throughout the day. Sharp left sided. Hx CHF and COPD. 324 ASA given, 2 nitro and 6 mg morphine given. Pain went from 8/10 to 1/10

## 2018-08-18 ENCOUNTER — Other Ambulatory Visit: Payer: Self-pay

## 2018-08-18 ENCOUNTER — Encounter (HOSPITAL_COMMUNITY): Payer: Self-pay | Admitting: General Practice

## 2018-08-18 ENCOUNTER — Encounter (HOSPITAL_COMMUNITY): Payer: Non-veteran care

## 2018-08-18 DIAGNOSIS — L03116 Cellulitis of left lower limb: Secondary | ICD-10-CM

## 2018-08-18 DIAGNOSIS — I10 Essential (primary) hypertension: Secondary | ICD-10-CM | POA: Diagnosis not present

## 2018-08-18 DIAGNOSIS — E1169 Type 2 diabetes mellitus with other specified complication: Secondary | ICD-10-CM | POA: Diagnosis not present

## 2018-08-18 DIAGNOSIS — D72829 Elevated white blood cell count, unspecified: Secondary | ICD-10-CM

## 2018-08-18 DIAGNOSIS — I110 Hypertensive heart disease with heart failure: Secondary | ICD-10-CM | POA: Diagnosis not present

## 2018-08-18 DIAGNOSIS — E7800 Pure hypercholesterolemia, unspecified: Secondary | ICD-10-CM

## 2018-08-18 DIAGNOSIS — L03115 Cellulitis of right lower limb: Secondary | ICD-10-CM | POA: Insufficient documentation

## 2018-08-18 DIAGNOSIS — I5032 Chronic diastolic (congestive) heart failure: Secondary | ICD-10-CM | POA: Diagnosis not present

## 2018-08-18 DIAGNOSIS — L03119 Cellulitis of unspecified part of limb: Secondary | ICD-10-CM

## 2018-08-18 DIAGNOSIS — R079 Chest pain, unspecified: Principal | ICD-10-CM

## 2018-08-18 DIAGNOSIS — E669 Obesity, unspecified: Secondary | ICD-10-CM

## 2018-08-18 HISTORY — DX: Elevated white blood cell count, unspecified: D72.829

## 2018-08-18 LAB — CBG MONITORING, ED: Glucose-Capillary: 133 mg/dL — ABNORMAL HIGH (ref 70–99)

## 2018-08-18 LAB — GLUCOSE, CAPILLARY
Glucose-Capillary: 139 mg/dL — ABNORMAL HIGH (ref 70–99)
Glucose-Capillary: 144 mg/dL — ABNORMAL HIGH (ref 70–99)
Glucose-Capillary: 147 mg/dL — ABNORMAL HIGH (ref 70–99)
Glucose-Capillary: 222 mg/dL — ABNORMAL HIGH (ref 70–99)
Glucose-Capillary: 124 mg/dL — ABNORMAL HIGH (ref 70–99)

## 2018-08-18 LAB — LIPID PANEL
Cholesterol: 149 mg/dL (ref 0–200)
HDL: 36 mg/dL — ABNORMAL LOW (ref >40)
LDL Cholesterol: 78 mg/dL (ref 0–99)
Total CHOL/HDL Ratio: 4.1 RATIO
Triglycerides: 174 mg/dL — ABNORMAL HIGH (ref <150)
VLDL: 35 mg/dL (ref 0–40)

## 2018-08-18 LAB — RAPID URINE DRUG SCREEN, HOSP PERFORMED
Amphetamines: NOT DETECTED
Barbiturates: NOT DETECTED
Benzodiazepines: POSITIVE — AB
Cocaine: NOT DETECTED
Opiates: POSITIVE — AB
Tetrahydrocannabinol: NOT DETECTED

## 2018-08-18 LAB — SEDIMENTATION RATE: Sed Rate: 28 mm/hr — ABNORMAL HIGH (ref 0–16)

## 2018-08-18 LAB — TROPONIN I
Troponin I: <0.03 ng/mL (ref <0.03)
Troponin I: <0.03 ng/mL (ref <0.03)
Troponin I: <0.03 ng/mL (ref <0.03)
Troponin I: <0.03 ng/mL (ref <0.03)

## 2018-08-18 LAB — HIV ANTIBODY (ROUTINE TESTING W REFLEX): HIV Screen 4th Generation wRfx: NONREACTIVE

## 2018-08-18 LAB — HEMOGLOBIN A1C
Hgb A1c MFr Bld: 8.2 % — ABNORMAL HIGH (ref 4.8–5.6)
Mean Plasma Glucose: 188.64 mg/dL

## 2018-08-18 LAB — C-REACTIVE PROTEIN: CRP: 1.3 mg/dL — ABNORMAL HIGH (ref <1.0)

## 2018-08-18 MED ORDER — GABAPENTIN 300 MG PO CAPS: 300.0000 mg | ORAL_CAPSULE | ORAL | Status: DC

## 2018-08-18 MED ORDER — MUSCLE RUB 10-15 % EX CREA
TOPICAL_CREAM | Freq: Two times a day (BID) | CUTANEOUS | Status: DC | PRN
  Filled 2018-08-18: qty 85

## 2018-08-18 MED ORDER — TRAZODONE HCL 100 MG PO TABS
200.0000 mg | ORAL_TABLET | Freq: Every day | ORAL | Status: DC
  Administered 2018-08-18: 200 mg via ORAL
  Filled 2018-08-18: qty 2

## 2018-08-18 MED ORDER — ATORVASTATIN CALCIUM 40 MG PO TABS
40.0000 mg | ORAL_TABLET | Freq: Every day | ORAL | Status: DC
  Administered 2018-08-18 – 2018-08-19 (×2): 40 mg via ORAL
  Filled 2018-08-18 (×2): qty 1

## 2018-08-18 MED ORDER — LISINOPRIL 10 MG PO TABS
10.0000 mg | ORAL_TABLET | Freq: Every day | ORAL | Status: DC
  Administered 2018-08-18 – 2018-08-19 (×2): 10 mg via ORAL
  Filled 2018-08-18 (×2): qty 1

## 2018-08-18 MED ORDER — INSULIN ASPART 100 UNIT/ML ~~LOC~~ SOLN
0.0000 [IU] | Freq: Three times a day (TID) | SUBCUTANEOUS | Status: DC
  Administered 2018-08-18: 3 [IU] via SUBCUTANEOUS
  Administered 2018-08-18 (×2): 1 [IU] via SUBCUTANEOUS
  Administered 2018-08-19: 2 [IU] via SUBCUTANEOUS
  Administered 2018-08-19: 1 [IU] via SUBCUTANEOUS
  Administered 2018-08-19: 2 [IU] via SUBCUTANEOUS

## 2018-08-18 MED ORDER — GABAPENTIN 300 MG PO CAPS
600.0000 mg | ORAL_CAPSULE | Freq: Every day | ORAL | Status: DC
  Administered 2018-08-18 (×2): 600 mg via ORAL
  Filled 2018-08-18 (×2): qty 2

## 2018-08-18 MED ORDER — PRAZOSIN HCL 2 MG PO CAPS
4.0000 mg | ORAL_CAPSULE | Freq: Every day | ORAL | Status: DC
  Administered 2018-08-18: 4 mg via ORAL
  Filled 2018-08-18 (×3): qty 2

## 2018-08-18 MED ORDER — ASPIRIN EC 325 MG PO TBEC
325.0000 mg | DELAYED_RELEASE_TABLET | Freq: Every day | ORAL | Status: DC
  Administered 2018-08-19: 325 mg via ORAL
  Filled 2018-08-18: qty 1

## 2018-08-18 MED ORDER — ONDANSETRON HCL 4 MG/2ML IJ SOLN: 4.0000 mg | Freq: Four times a day (QID) | INTRAMUSCULAR | Status: DC | PRN

## 2018-08-18 MED ORDER — HYDRALAZINE HCL 20 MG/ML IJ SOLN: 5.0000 mg | INTRAMUSCULAR | Status: DC | PRN

## 2018-08-18 MED ORDER — VENLAFAXINE HCL ER 150 MG PO CP24
150.0000 mg | ORAL_CAPSULE | Freq: Every day | ORAL | Status: DC
  Administered 2018-08-18 – 2018-08-19 (×2): 150 mg via ORAL
  Filled 2018-08-18 (×2): qty 1
  Filled 2018-08-18: qty 2

## 2018-08-18 MED ORDER — FLUTICASONE PROPIONATE 50 MCG/ACT NA SUSP
1.0000 | Freq: Every day | NASAL | Status: DC
  Administered 2018-08-18 – 2018-08-19 (×2): 1 via NASAL
  Filled 2018-08-18: qty 16

## 2018-08-18 MED ORDER — NITROGLYCERIN 0.4 MG SL SUBL
0.4000 mg | SUBLINGUAL_TABLET | SUBLINGUAL | Status: DC | PRN
  Administered 2018-08-18: 0.4 mg via SUBLINGUAL
  Filled 2018-08-18: qty 1

## 2018-08-18 MED ORDER — OXYCODONE-ACETAMINOPHEN 5-325 MG PO TABS
1.0000 | ORAL_TABLET | ORAL | Status: DC | PRN
  Administered 2018-08-18 – 2018-08-19 (×4): 1 via ORAL
  Filled 2018-08-18 (×4): qty 1

## 2018-08-18 MED ORDER — ALPRAZOLAM 0.25 MG PO TABS: 0.2500 mg | ORAL_TABLET | Freq: Two times a day (BID) | ORAL | Status: DC | PRN

## 2018-08-18 MED ORDER — LOPERAMIDE HCL 2 MG PO CAPS: 2.0000 mg | ORAL_CAPSULE | ORAL | Status: DC | PRN

## 2018-08-18 MED ORDER — FUROSEMIDE 80 MG PO TABS
80.0000 mg | ORAL_TABLET | Freq: Two times a day (BID) | ORAL | Status: DC
  Administered 2018-08-18 – 2018-08-19 (×3): 80 mg via ORAL
  Filled 2018-08-18 (×4): qty 1

## 2018-08-18 MED ORDER — GABAPENTIN 300 MG PO CAPS
300.0000 mg | ORAL_CAPSULE | ORAL | Status: DC
  Administered 2018-08-18 – 2018-08-19 (×4): 300 mg via ORAL
  Filled 2018-08-18 (×3): qty 1

## 2018-08-18 MED ORDER — LORATADINE 10 MG PO TABS
10.0000 mg | ORAL_TABLET | Freq: Every day | ORAL | Status: DC
  Administered 2018-08-18 – 2018-08-19 (×2): 10 mg via ORAL
  Filled 2018-08-18 (×2): qty 1

## 2018-08-18 MED ORDER — ENOXAPARIN SODIUM 40 MG/0.4ML ~~LOC~~ SOLN
40.0000 mg | Freq: Every day | SUBCUTANEOUS | Status: DC
  Administered 2018-08-18: 40 mg via SUBCUTANEOUS
  Filled 2018-08-18 (×3): qty 0.4

## 2018-08-18 MED ORDER — ALLOPURINOL 100 MG PO TABS
100.0000 mg | ORAL_TABLET | Freq: Every day | ORAL | Status: DC
  Administered 2018-08-18 – 2018-08-19 (×2): 100 mg via ORAL
  Filled 2018-08-18 (×2): qty 1

## 2018-08-18 MED ORDER — ZOLPIDEM TARTRATE 5 MG PO TABS: 5.0000 mg | ORAL_TABLET | Freq: Every evening | ORAL | Status: DC | PRN

## 2018-08-18 MED ORDER — TEMAZEPAM 15 MG PO CAPS
15.0000 mg | ORAL_CAPSULE | Freq: Every day | ORAL | Status: DC
  Administered 2018-08-18: 15 mg via ORAL
  Filled 2018-08-18: qty 1

## 2018-08-18 MED ORDER — HYDROMORPHONE HCL 1 MG/ML IJ SOLN
1.0000 mg | INTRAMUSCULAR | Status: DC | PRN
  Administered 2018-08-18: 1 mg via INTRAVENOUS
  Filled 2018-08-18: qty 1

## 2018-08-18 MED ORDER — CEFAZOLIN SODIUM-DEXTROSE 2-4 GM/100ML-% IV SOLN
2.0000 g | Freq: Three times a day (TID) | INTRAVENOUS | Status: DC
  Administered 2018-08-18 – 2018-08-19 (×5): 2 g via INTRAVENOUS
  Filled 2018-08-18 (×6): qty 100

## 2018-08-18 MED ORDER — MORPHINE SULFATE (PF) 4 MG/ML IV SOLN
4.0000 mg | Freq: Once | INTRAVENOUS | Status: AC
  Administered 2018-08-18: 4 mg via INTRAVENOUS
  Filled 2018-08-18: qty 1

## 2018-08-18 MED ORDER — TROLAMINE SALICYLATE 10 % EX LOTN: TOPICAL_LOTION | Freq: Two times a day (BID) | CUTANEOUS | Status: DC | PRN

## 2018-08-18 MED ORDER — ACETAMINOPHEN 325 MG PO TABS: 650.0000 mg | ORAL_TABLET | ORAL | Status: DC | PRN

## 2018-08-18 MED ORDER — ARIPIPRAZOLE 5 MG PO TABS
5.0000 mg | ORAL_TABLET | Freq: Every day | ORAL | Status: DC
  Administered 2018-08-18 (×2): 5 mg via ORAL
  Filled 2018-08-18 (×3): qty 1

## 2018-08-18 MED ORDER — INSULIN ASPART 100 UNIT/ML ~~LOC~~ SOLN: 0.0000 [IU] | Freq: Every day | SUBCUTANEOUS | Status: DC

## 2018-08-18 MED ORDER — SILVER SULFADIAZINE 1 % EX CREA
1.0000 "application " | TOPICAL_CREAM | Freq: Every day | CUTANEOUS | Status: DC
  Administered 2018-08-18 – 2018-08-19 (×2): 1 via TOPICAL
  Filled 2018-08-18: qty 85

## 2018-08-18 NOTE — H&P (Signed)
History and Physical    Lawrence [name] RWE:[id number] DOB: 12-30-1955 DOA: 08/17/2018  Referring MD/NP/PA:   PCP: [place]   Patient coming from:  The patient is coming from home.  At baseline, pt is independent for most of ADL.        Chief Complaint: Chest pain and leg pain  HPI: Lawrence [name] is a 63 y.o. male with medical history significant of Hypertension, hyperlipidemia, diabetes mellitus, dCHF, OSA, PTSD, morbid obesity, gout, depression, anxiety, who presents with chest pain and leg pain.  Patient states that his chest pain started this morning.  It is located in the left central chest, constant, 8 out of 10 in severity, nonradiating.  It is associated with mild shortness of breath.  No cough, fever or chills.  Patient has bilateral leg swelling and leg pain which has been going on for a while per patient.  Both lower extremities are erythematous.  Patient denies nausea vomiting, diarrhea, abdominal pain, symptoms of UTI or unilateral weakness.  ED Course: pt was found to have negative troponin, BNP 49.8, WBC 13.4, electrolytes renal function okay, temperature 99, heart rate 80s, oxygen saturation 94% on room air, negative chest x-ray.  CT angiogram is negative for PE, no infiltration.  Patient is placed on telemetry bed for observation.  Review of Systems:   General: no fevers, chills, no body weight gain, has fatigue HEENT: no blurry vision, hearing changes or sore throat Respiratory: has dyspnea, no coughing, wheezing CV: has chest pain, no palpitations GI: no nausea, vomiting, abdominal pain, diarrhea, constipation GU: no dysuria, burning on urination, increased urinary frequency, hematuria  Ext: has leg edema Neuro: no unilateral weakness, numbness, or tingling, no vision change or hearing loss Skin: no rash, no skin tear. MSK: No muscle spasm, no deformity, no limitation of range of movement in spin Heme: No easy bruising.  Travel history: No recent long  distant travel.  Allergy:  Allergies  Allergen Reactions  . Tubersol [Tuberculin] Other (See Comments)    Reaction unknown  . Vancomycin Other (See Comments)    Red Man Syndrome  . Doxycycline Rash    Past Medical History:  Diagnosis Date  . Anxiety   . Arthritis   . CHF (congestive heart failure) ([place])   . Chronic cough   . Depression   . Hypertension   . New onset type 2 diabetes mellitus ([place]) 11/08/2017  . PTSD (post-traumatic stress disorder)   . Sleep apnea     Past Surgical History:  Procedure Laterality Date  . HERNIA REPAIR    . JOINT REPLACEMENT     left knee replacement x 2  . KNEE ARTHROSCOPY    . LEFT HEART CATH AND CORONARY ANGIOGRAPHY N/A 01/17/2018   Procedure: LEFT HEART CATH AND CORONARY ANGIOGRAPHY;  Surgeon: Leonie Man, MD;  Location: [facility];  Service: Cardiovascular;  Laterality: N/A;    Social History:  reports that he quit smoking about 2 years ago. He has never used smokeless tobacco. He reports current alcohol use. He reports that he does not use drugs.  Family History:  Family History  Problem Relation Age of Onset  . CAD Maternal Grandfather   . Diabetes Mellitus II Neg Hx      Prior to Admission medications   Medication Sig Start Date End Date Taking? Authorizing Provider  acetaminophen (TYLENOL) 500 MG tablet Take 1,000 mg by mouth 3 (three) times daily.     [provider]  allopurinol (ZYLOPRIM)  100 MG tablet Take 1 tablet (100 mg total) by mouth daily. 01/18/18   Domenic Polite, MD  ARIPiprazole (ABILIFY) 5 MG tablet Take 5 mg by mouth at bedtime.     [provider]  aspirin EC 81 MG EC tablet Take 1 tablet (81 mg total) by mouth daily. 01/19/18   Domenic Polite, MD  atorvastatin (LIPITOR) 40 MG tablet Take 1 tablet (40 mg total) by mouth daily at 6 PM. 01/18/18   Domenic Polite, MD  blood glucose meter kit and supplies Dispense based on patient and insurance preference. Use up to four times daily as  directed. (FOR ICD-10 E10.9, E11.9). 11/09/17   Eugenie Filler, MD  cetirizine (ZYRTEC) 10 MG tablet Take 10 mg by mouth daily.    [provider]  fluticasone (FLONASE) 50 MCG/ACT nasal spray Place 1 spray into both nostrils daily.     [provider]  furosemide (LASIX) 80 MG tablet Take 80 mg by mouth 2 (two) times daily.    [provider]  gabapentin (NEURONTIN) 300 MG capsule Take 300-600 mg by mouth See admin instructions. Take 1 capsule every morning and at noon then take 2 capsules at bedtime    [provider]  lisinopril (PRINIVIL,ZESTRIL) 10 MG tablet Take 10 mg by mouth daily.    [provider]  loperamide (IMODIUM) 2 MG capsule Take 2 mg by mouth as needed for diarrhea or loose stools.    [provider]  metFORMIN (GLUCOPHAGE) 500 MG tablet Take 1 tablet (500 mg total) by mouth 2 (two) times daily with a meal. 11/09/17 11/09/18  Eugenie Filler, MD  metolazone (ZAROXOLYN) 2.5 MG tablet Take 2.5 mg by mouth every Monday, Wednesday, and Friday.     [provider]  temazepam (RESTORIL) 7.5 MG capsule Take 15 mg by mouth at bedtime.     [provider]  traZODone (DESYREL) 100 MG tablet Take 200 mg by mouth at bedtime.    [provider]  TRIAMCINOLONE ACETONIDE,NASAL, NA Place 2 sprays into the nose every evening.     [provider]  Trolamine Salicylate (ASPERCREME EX) Apply 1 application topically 2 (two) times daily as needed (pain and inflammation).    [provider]    Physical Exam: Vitals:   08/18/18 0200 08/18/18 0230 08/18/18 0255 08/18/18 0300  BP: 112/76 134/86 (!) 141/90   Pulse: 80 80 82   Resp: _0 Temp:   97.7 F (36.5 C)   TempSrc:   Oral   SpO2: 96%  96%   Weight:    (!) 180.6 kg  Height:    _1  (1.854 m)   General: Not in acute distress HEENT:       Eyes: PERRL, EOMI, no scleral icterus.       ENT: No discharge from the ears and nose, no pharynx  injection, no tonsillar enlargement.        Neck: No JVD, no bruit, no mass felt. Heme: No neck lymph node enlargement. Cardiac: S1/S2, RRR, No murmurs, No gallops or rubs. Respiratory:  No rales, wheezing, rhonchi or rubs. GI: Soft, nondistended, nontender, no rebound pain, no organomegaly, BS present. GU: No hematuria Ext: 2+ pitting leg edema bilaterally. 2+DP/PT pulse bilaterally.  Has erythema, warmth and tenderness in both lower legs. Musculoskeletal: No joint deformities, No joint redness or warmth, no limitation of ROM in spin. Skin: No rashes.  Neuro: Alert, oriented X3, cranial nerves II-XII grossly intact,  moves all extremities normally. Psych: Patient is not psychotic, no suicidal or hemocidal ideation.  Labs on Admission: I have personally reviewed following labs and imaging studies  CBC: Recent Labs  Lab 08/17/18 1824  WBC 13.4*  NEUTROABS 10.4*  HGB 12.8*  HCT 40.6  MCV 87.9  PLT 127   Basic Metabolic Panel: Recent Labs  Lab 08/17/18 1824  NA 135  K 4.1  CL 98  CO2 23  GLUCOSE 158*  BUN 15  CREATININE 0.86  CALCIUM 9.2   GFR: Estimated Creatinine Clearance: 153.4 mL/min (by C-G formula based on SCr of 0.86 mg/dL). Liver Function Tests: No results for input(s): AST, ALT, ALKPHOS, BILITOT, PROT, ALBUMIN in the last 168 hours. No results for input(s): LIPASE, AMYLASE in the last 168 hours. No results for input(s): AMMONIA in the last 168 hours. Coagulation Profile: No results for input(s): INR, PROTIME in the last 168 hours. Cardiac Enzymes: Recent Labs  Lab 08/17/18 2332 08/18/18 0144  TROPONINI <0.03 <0.03   BNP (last 3 results) No results for input(s): PROBNP in the last 8760 hours. HbA1C: Recent Labs    08/18/18 0144  HGBA1C 8.2*   CBG: Recent Labs  Lab 08/18/18 0141 08/18/18 0307  GLUCAP 133* 139*   Lipid Profile: Recent Labs    08/18/18 0144  CHOL 149  HDL 36*  LDLCALC 78  TRIG 174*  CHOLHDL 4.1   Thyroid Function  Tests: No results for input(s): TSH, T4TOTAL, FREET4, T3FREE, THYROIDAB in the last 72 hours. Anemia Panel: No results for input(s): VITAMINB12, FOLATE, FERRITIN, TIBC, IRON, RETICCTPCT in the last 72 hours. Urine analysis:    Component Value Date/Time   COLORURINE RED (A) 02/06/2017 1215   APPEARANCEUR CLOUDY (A) 02/06/2017 1215   LABSPEC  02/06/2017 1215    TEST NOT REPORTED DUE TO COLOR INTERFERENCE OF URINE PIGMENT   PHURINE  02/06/2017 1215    TEST NOT REPORTED DUE TO COLOR INTERFERENCE OF URINE PIGMENT   GLUCOSEU (A) 02/06/2017 1215    TEST NOT REPORTED DUE TO COLOR INTERFERENCE OF URINE PIGMENT   HGBUR (A) 02/06/2017 1215    TEST NOT REPORTED DUE TO COLOR INTERFERENCE OF URINE PIGMENT   BILIRUBINUR (A) 02/06/2017 1215    TEST NOT REPORTED DUE TO COLOR INTERFERENCE OF URINE PIGMENT   KETONESUR (A) 02/06/2017 1215    TEST NOT REPORTED DUE TO COLOR INTERFERENCE OF URINE PIGMENT   PROTEINUR (A) 02/06/2017 1215    TEST NOT REPORTED DUE TO COLOR INTERFERENCE OF URINE PIGMENT   NITRITE (A) 02/06/2017 1215    TEST NOT REPORTED DUE TO COLOR INTERFERENCE OF URINE PIGMENT   LEUKOCYTESUR (A) 02/06/2017 1215    TEST NOT REPORTED DUE TO COLOR INTERFERENCE OF URINE PIGMENT   Sepsis Labs: _0 (procalcitonin:4,lacticidven:4) )No results found for this or any previous visit (from the past 240 hour(s)).   Radiological Exams on Admission: Dg Chest 2 View  Result Date: 08/17/2018 CLINICAL DATA:  Left-sided chest pain. EXAM: CHEST - 2 VIEW COMPARISON:  Chest x-ray dated February 15, 2018. FINDINGS: Stable mild cardiomegaly. Normal pulmonary vascularity. Linear subsegmental atelectasis at both lung bases. No focal consolidation, pleural effusion, or pneumothorax. No acute osseous abnormality. IMPRESSION: 1.  No active cardiopulmonary disease. 2. Mild bibasilar atelectasis. Electronically Signed   By: Titus Dubin M.D.   On: 08/17/2018 19:42   Ct Angio Chest Pe W And/or Wo Contrast  Result  Date: 08/17/2018 CLINICAL DATA:  63 y/o M; worsening left-sided chest pain with shortness of breath.  EXAM: CT ANGIOGRAPHY CHEST WITH CONTRAST TECHNIQUE: Multidetector CT imaging of the chest was performed using the standard protocol during bolus administration of intravenous contrast. Multiplanar CT image reconstructions and MIPs were obtained to evaluate the vascular anatomy. CONTRAST:  153m ISOVUE-370 IOPAMIDOL (ISOVUE-370) INJECTION 76% COMPARISON:  01/13/2018 CT angiogram of the chest. FINDINGS: Cardiovascular: Stable cardiomegaly. Severe coronary artery calcific atherosclerosis. Aortic valvular calcific atherosclerosis. Normal caliber thoracic aorta and main pulmonary artery. Satisfactory opacification of the pulmonary arteries. Mediastinum/Nodes: No enlarged mediastinal, hilar, or axillary lymph nodes. Thyroid gland, trachea, and esophagus demonstrate no significant findings. Lungs/Pleura: Minor atelectasis in the lung bases. No focal consolidation. No pleural effusion or pneumothorax. Upper Abdomen: Multiple liver cysts. Right kidney interpolar cyst measuring 20 mm. Musculoskeletal: No chest wall abnormality. No acute or significant osseous findings. Review of the MIP images confirms the above findings. IMPRESSION: 1. No pulmonary embolus identified. 2. Minor atelectasis in the lung bases. 3. Stable cardiomegaly, aortic atherosclerosis, and coronary artery calcifications. Electronically Signed   By: LKristine GarbeM.D.   On: 08/17/2018 23:05     EKG: Independently reviewed.  Sinus rhythm, QTC 461, low voltage, nonspecific T wave change.  Assessment/Plan Principal Problem:   Chest pain Active Problems:   Cellulitis of lower extremity   Essential hypertension, benign   Diabetes mellitus type 2 in obese (HCC)   Pure hypercholesterolemia   Chronic diastolic CHF (congestive heart failure) (H[facility])   Chest pain: CTA negative for PE. CXR negative for PAN. initial trop negative. No new EKG  change.  - will place on Tele bed for obs - cycle CE q6 x3 and repeat EKG in the am  - prn Nitroglycerin, prn dilaudid and aspirin, lipitor  - Risk factor stratification: will check FLP and A1C, UDS - did not order 2d echo--> please reevaluate pt in AM to decide if pt needs 2D echo. - inpt card consult was requested via Epic  Possible cellulitis of lower extremity bilaterally: pt has fever and leukocytosis.  He has bilateral leg edema, erythema and tenderness, indicating possible cellulitis. -Start Ancef -Check CRP and ESR -Follow-up blood culture -Lower extremity venous Doppler to rule out a DVT -Percocet for pain  HTN:  -Continue home medications: Lisinopril -Also on Lasix and prazosin -IV hydralazine prn  Diabetes mellitus type 2 in obese ([facility]: Last A1c 8.2 on 01/14/18, poorly controled. Patient is taking metformin at home -SSI  Pure hypercholesterolemia: -lipitor  Chronic diastolic CHF (congestive heart failure) ([facility]: Patient has 2+ leg edema and mild shortness of breath.  No pulmonary edema chest x-ray.  BNP is 49.8.  Does not seem to have acute CHF exacerbation. -Continue home dose Lasix.   DVT ppx: SQ Lovenox Code Status: DNR (patient has DNR yellow paper documents with him) Family Communication:    Yes, patient's patterner  at bed side Disposition Plan:  Anticipate discharge back to previous home environment Consults called:  none Admission status: Obs / tele    Date of Service 08/18/2018    XIvor CostaTriad Hospitalists Pager [phone] If 7PM-7AM, please contact night-coverage [email or web address] Password [email or web address] 08/18/2018, 5:25 AM

## 2018-08-18 NOTE — Progress Notes (Signed)
Pt wearing CPAP for the night. 

## 2018-08-18 NOTE — Progress Notes (Signed)
Patient seen and examined at bedside, patient admitted after midnight, please see earlier detailed admission note by Ivor Costa, MD. Briefly, patient presented presented with somewhat atypical chest pain. Recent Transthoracic Echocardiogram and cath last year. Concern for possible ACS. Troponin negative overnight. Also here with evidence of mild cellulitic infection of his legs. Started on Ancef with some improvement. Anticipate switch to Keflex in AM and possible discharge.   Cordelia Poche, MD [facility] 08/18/2018, 3:26 PM

## 2018-08-18 NOTE — Consult Note (Addendum)
Yes [provider]  silver sulfADIAZINE (SILVADENE) 1 % cream Apply 1 application topically daily. Una boot (both legs)   Yes [provider]  temazepam (RESTORIL) 15 MG capsule Take 15 mg by mouth at bedtime.    Yes [provider]  traZODone (DESYREL) 100 MG tablet Take 200 mg by mouth at bedtime.   Yes [provider]  TRIAMCINOLONE ACETONIDE,NASAL, NA Place 2 sprays into both nostrils every evening.    Yes [provider]  Trolamine Salicylate (ASPERCREME EX) Apply 1 application topically 2 (two) times daily as needed (pain and inflammation).   Yes [provider]  venlafaxine XR (EFFEXOR-XR) 150 MG 24 hr capsule Take 150 mg by mouth daily with breakfast.   Yes [provider]  blood glucose meter kit and supplies Dispense based on patient and insurance preference. Use up to four times daily as directed. (FOR ICD-10 E10.9, E11.9). 11/09/17   Eugenie Filler, MD    Allergies:    Allergies  Allergen Reactions  . Tubersol [Tuberculin] Other (See Comments)    Reaction unknown  . Vancomycin Other (See Comments)    Red Man Syndrome  . Doxycycline Rash    Social History:   Social History   Socioeconomic History  . Marital status:  Single    Spouse name: Not on file  . Number of children: Not on file  . Years of education: Not on file  . Highest education level: Not on file  Occupational History  . Not on file  Social Needs  . Financial resource strain: Not on file  . Food insecurity:    Worry: Not on file    Inability: Not on file  . Transportation needs:    Medical: Not on file    Non-medical: Not on file  Tobacco Use  . Smoking status: Former Smoker    Last attempt to quit: 03/07/2016    Years since quitting: 2.4  . Smokeless tobacco: Never Used  Substance and Sexual Activity  . Alcohol use: Yes    Comment: rare  . Drug use: No  . Sexual activity: Not on file  Lifestyle  . Physical activity:    Days per week: Not on file    Minutes per session: Not on file  . Stress: Not on file  Relationships  . Social connections:    Talks on phone: Not on file    Gets together: Not on file    Attends religious service: Not on file    Active member of club or organization: Not on file    Attends meetings of clubs or organizations: Not on file    Relationship status: Not on file  . Intimate partner violence:    Fear of current or ex partner: Not on file    Emotionally abused: Not on file    Physically abused: Not on file    Forced sexual activity: Not on file  Other Topics Concern  . Not on file  Social History Narrative  . Not on file    Family History:   The patient's family history includes CAD in his maternal grandfather. There is no history of Diabetes Mellitus II.  ROS:  Please see the history of present illness.  All other ROS reviewed and negative.     Physical Exam/Data:   Vitals:   08/18/18 0300 08/18/18 0845 08/18/18 0919 08/18/18 1149  BP:  (!) 152/91 (!) 146/80 137/64  Pulse:  69  84  Resp:  18

## 2018-08-19 ENCOUNTER — Encounter (HOSPITAL_COMMUNITY): Payer: Non-veteran care

## 2018-08-19 DIAGNOSIS — L03119 Cellulitis of unspecified part of limb: Secondary | ICD-10-CM | POA: Diagnosis not present

## 2018-08-19 DIAGNOSIS — R079 Chest pain, unspecified: Secondary | ICD-10-CM | POA: Diagnosis not present

## 2018-08-19 LAB — GLUCOSE, CAPILLARY
Glucose-Capillary: 160 mg/dL — ABNORMAL HIGH (ref 70–99)
Glucose-Capillary: 184 mg/dL — ABNORMAL HIGH (ref 70–99)
Glucose-Capillary: 145 mg/dL — ABNORMAL HIGH (ref 70–99)

## 2018-08-19 LAB — TSH: TSH: 1.443 u[IU]/mL (ref 0.350–4.500)

## 2018-08-19 MED ORDER — CEPHALEXIN 500 MG PO CAPS: 500.0000 mg | ORAL_CAPSULE | Freq: Four times a day (QID) | ORAL | 0 refills | Status: AC

## 2018-08-19 NOTE — Discharge Summary (Signed)
Physician Discharge Summary  Lawrence [name] QIW:[id number] DOB: 04-06-1956 DOA: 08/17/2018  PCP: [facility]  Admit date: 08/17/2018 Discharge date: 08/19/2018  Admitted From: Home Disposition: Home  Recommendations for Outpatient Follow-up:  1. Follow up with PCP in 1 week 2. Please follow up on the following pending results: None  Home Health: None Equipment/Devices: None  Discharge Condition: Stable CODE STATUS: DNR Diet recommendation: Heart healthy   Brief/Interim Summary:  Admission HPI written by Ivor Costa, MD   Chief Complaint: Chest pain and leg pain  HPI: Lawrence [name] is a 63 y.o. male with medical history significant of Hypertension, hyperlipidemia, diabetes mellitus, dCHF, OSA, PTSD, morbid obesity, gout, depression, anxiety, who presents with chest pain and leg pain.  Patient states that his chest pain started this morning.  It is located in the left central chest, constant, 8 out of 10 in severity, nonradiating.  It is associated with mild shortness of breath.  No cough, fever or chills.  Patient has bilateral leg swelling and leg pain which has been going on for a while per patient.  Both lower extremities are erythematous.  Patient denies nausea vomiting, diarrhea, abdominal pain, symptoms of UTI or unilateral weakness.  ED Course: pt was found to have negative troponin, BNP 49.8, WBC 13.4, electrolytes renal function okay, temperature 99, heart rate 80s, oxygen saturation 94% on room air, negative chest x-ray.  CT angiogram is negative for PE, no infiltration.  Patient is placed on telemetry bed for observation.   Hospital course:  Chest pain CTA obtained and negative for chest x-ray. Troponin trended and negative. Cardiology consulted with recommendations for no ischemic workup in light of recent low risk cath.  Cellulitis Left leg. Associated leg wound. Started on Ancef with some improvement. Discharged on Keflex.  Other chronic medical  conditions unchanged and medications continued.  Discharge Diagnoses:  Principal Problem:   Chest pain Active Problems:   Cellulitis of lower extremity   Essential hypertension, benign   Diabetes mellitus type 2 in obese (HCC)   Pure hypercholesterolemia   Chronic diastolic CHF (congestive heart failure) [facility])    Discharge Instructions  Discharge Instructions    Diet - low sodium heart healthy   Complete by:  As directed    Increase activity slowly   Complete by:  As directed      Allergies as of 08/19/2018      Reactions   Tubersol [tuberculin] Other (See Comments)   Reaction unknown   Vancomycin Other (See Comments)   Red Man Syndrome   Doxycycline Rash      Medication List    TAKE these medications   acetaminophen 500 MG tablet Commonly known as:  TYLENOL Take 500 mg by mouth 4 (four) times daily as needed for mild pain.   albuterol 108 (90 Base) MCG/ACT inhaler Commonly known as:  PROVENTIL HFA;VENTOLIN HFA Inhale 2 puffs into the lungs every 6 (six) hours as needed for wheezing or shortness of breath.   allopurinol 100 MG tablet Commonly known as:  ZYLOPRIM Take 1 tablet (100 mg total) by mouth daily.   ARIPiprazole 5 MG tablet Commonly known as:  ABILIFY Take 5 mg by mouth at bedtime.   ASPERCREME EX Apply 1 application topically 2 (two) times daily as needed (pain and inflammation).   aspirin 81 MG EC tablet Take 1 tablet (81 mg total) by mouth daily.   atorvastatin 40 MG tablet Commonly known as:  LIPITOR Take 1 tablet (40 mg total) by  mouth daily at 6 PM.   blood glucose meter kit and supplies Dispense based on patient and insurance preference. Use up to four times daily as directed. (FOR ICD-10 E10.9, E11.9).   cephALEXin 500 MG capsule Commonly known as:  KEFLEX Take 1 capsule (500 mg total) by mouth 4 (four) times daily for 5 days.   cetirizine 10 MG tablet Commonly known as:  ZYRTEC Take 10 mg by mouth daily.   fluticasone 50 MCG/ACT  nasal spray Commonly known as:  FLONASE Place 1 spray into both nostrils daily.   furosemide 80 MG tablet Commonly known as:  LASIX Take 80 mg by mouth 2 (two) times daily.   gabapentin 300 MG capsule Commonly known as:  NEURONTIN Take 300-600 mg by mouth See admin instructions. Take 1 capsule every morning and at 3PM then take 2 capsules at bedtime(9pm)   lisinopril 10 MG tablet Commonly known as:  PRINIVIL,ZESTRIL Take 10 mg by mouth daily.   loperamide 2 MG capsule Commonly known as:  IMODIUM Take 2 mg by mouth as needed for diarrhea or loose stools.   metFORMIN 500 MG tablet Commonly known as:  GLUCOPHAGE Take 1 tablet (500 mg total) by mouth 2 (two) times daily with a meal.   prazosin 2 MG capsule Commonly known as:  MINIPRESS Take 4 mg by mouth at bedtime.   silver sulfADIAZINE 1 % cream Commonly known as:  SILVADENE Apply 1 application topically daily. Una boot (both legs)   temazepam 15 MG capsule Commonly known as:  RESTORIL Take 15 mg by mouth at bedtime.   traZODone 100 MG tablet Commonly known as:  DESYREL Take 200 mg by mouth at bedtime.   TRIAMCINOLONE ACETONIDE(NASAL) NA Place 2 sprays into both nostrils every evening.   venlafaxine XR 150 MG 24 hr capsule Commonly known as:  EFFEXOR-XR Take 150 mg by mouth daily with breakfast.      [place]. Schedule an appointment as soon as possible for a visit in 1 week(s).   Specialty:  General Practice Contact information: [place] [id number] [phone]          Allergies  Allergen Reactions  . Tubersol [Tuberculin] Other (See Comments)    Reaction unknown  . Vancomycin Other (See Comments)    Red Man Syndrome  . Doxycycline Rash    Consultations:  Cardiology   Procedures/Studies: Dg Chest 2 View  Result Date: 08/17/2018 CLINICAL DATA:  Left-sided chest pain. EXAM: CHEST - 2 VIEW COMPARISON:  Chest x-ray dated February 15, 2018.  FINDINGS: Stable mild cardiomegaly. Normal pulmonary vascularity. Linear subsegmental atelectasis at both lung bases. No focal consolidation, pleural effusion, or pneumothorax. No acute osseous abnormality. IMPRESSION: 1.  No active cardiopulmonary disease. 2. Mild bibasilar atelectasis. Electronically Signed   By: Titus Dubin M.D.   On: 08/17/2018 19:42   Ct Angio Chest Pe W And/or Wo Contrast  Result Date: 08/17/2018 CLINICAL DATA:  63 y/o M; worsening left-sided chest pain with shortness of breath. EXAM: CT ANGIOGRAPHY CHEST WITH CONTRAST TECHNIQUE: Multidetector CT imaging of the chest was performed using the standard protocol during bolus administration of intravenous contrast. Multiplanar CT image reconstructions and MIPs were obtained to evaluate the vascular anatomy. CONTRAST:  144m ISOVUE-370 IOPAMIDOL (ISOVUE-370) INJECTION 76% COMPARISON:  01/13/2018 CT angiogram of the chest. FINDINGS: Cardiovascular: Stable cardiomegaly. Severe coronary artery calcific atherosclerosis. Aortic valvular calcific atherosclerosis. Normal caliber thoracic aorta and main pulmonary artery. Satisfactory opacification of the  pulmonary arteries. Mediastinum/Nodes: No enlarged mediastinal, hilar, or axillary lymph nodes. Thyroid gland, trachea, and esophagus demonstrate no significant findings. Lungs/Pleura: Minor atelectasis in the lung bases. No focal consolidation. No pleural effusion or pneumothorax. Upper Abdomen: Multiple liver cysts. Right kidney interpolar cyst measuring 20 mm. Musculoskeletal: No chest wall abnormality. No acute or significant osseous findings. Review of the MIP images confirms the above findings. IMPRESSION: 1. No pulmonary embolus identified. 2. Minor atelectasis in the lung bases. 3. Stable cardiomegaly, aortic atherosclerosis, and coronary artery calcifications. Electronically Signed   By: Kristine Garbe M.D.   On: 08/17/2018 23:05      Subjective: Mild pain overnight. Not  associated with activity. Lasted a few seconds.  Discharge Exam: Vitals:   08/19/18 0331 08/19/18 1157  BP: 109/74 130/68  Pulse: 86 88  Resp: 18 20  Temp: 97.7 F (36.5 C) 98 F (36.7 C)  SpO2: 96% 94%   Vitals:   08/18/18 1909 08/18/18 2132 08/19/18 0331 08/19/18 1157  BP: 118/64 (!) 149/94 109/74 130/68  Pulse: 88 81 86 88  Resp: '18  18 20  '$ Temp: 98.2 F (36.8 C)  97.7 F (36.5 C) 98 F (36.7 C)  TempSrc: Oral  Oral Oral  SpO2: 93%  96% 94%  Weight:   (!) 177.9 kg   Height:        General: Pt is alert, awake, not in acute distress Cardiovascular: RRR, S1/S2 +, no rubs, no gallops Respiratory: CTA bilaterally, no wheezing, no rhonchi Abdominal: Soft, NT, ND, bowel sounds + Extremities: no cyanosis    The results of significant diagnostics from this hospitalization (including imaging, microbiology, ancillary and laboratory) are listed below for reference.     Microbiology: Recent Results (from the past 240 hour(s))  Culture, blood (routine x 2)     Status: None (Preliminary result)   Collection Time: 08/18/18  1:30 AM  Result Value Ref Range Status   Specimen Description BLOOD RIGHT ARM  Final   Special Requests   Final    BOTTLES DRAWN AEROBIC AND ANAEROBIC Blood Culture adequate volume   Culture   Final    NO GROWTH 1 DAY Performed at [facility], [address], [place], [place] [address]    Report Status PENDING  Incomplete  Culture, blood (routine x 2)     Status: None (Preliminary result)   Collection Time: 08/18/18  1:45 AM  Result Value Ref Range Status   Specimen Description BLOOD RIGHT HAND  Final   Special Requests   Final    BOTTLES DRAWN AEROBIC ONLY Blood Culture results may not be optimal due to an excessive volume of blood received in culture bottles   Culture   Final    NO GROWTH 1 DAY Performed at [facility], [place] [address], [place], [place] [address]    Report Status PENDING  Incomplete     Labs: BNP (last 3  results) Recent Labs    11/08/17 1012 01/12/18 2242 08/17/18 1824  BNP 28.6 29.3 27.2   Basic Metabolic Panel: Recent Labs  Lab 08/17/18 1824  NA 135  K 4.1  CL 98  CO2 23  GLUCOSE 158*  BUN 15  CREATININE 0.86  CALCIUM 9.2   Liver Function Tests: No results for input(s): AST, ALT, ALKPHOS, BILITOT, PROT, ALBUMIN in the last 168 hours. No results for input(s): LIPASE, AMYLASE in the last 168 hours. No results for input(s): AMMONIA in the last 168 hours. CBC: Recent Labs  Lab 08/17/18 1824  WBC 13.4*  NEUTROABS 10.4*  HGB 12.8*  HCT 40.6  MCV 87.9  PLT 326   Cardiac Enzymes: Recent Labs  Lab 08/17/18 2332 08/18/18 0144 08/18/18 0728 08/18/18 1302  TROPONINI <0.03 <0.03 <0.03 <0.03   BNP: Invalid input(s): POCBNP CBG: Recent Labs  Lab 08/18/18 1146 08/18/18 1658 08/18/18 2104 08/19/18 0805 08/19/18 1156  GLUCAP 147* 222* 124* 160* 184*   D-Dimer No results for input(s): DDIMER in the last 72 hours. Hgb A1c Recent Labs    08/18/18 0144  HGBA1C 8.2*   Lipid Profile Recent Labs    08/18/18 0144  CHOL 149  HDL 36*  LDLCALC 78  TRIG 174*  CHOLHDL 4.1   Thyroid function studies Recent Labs    08/19/18 0423  TSH 1.443   Anemia work up No results for input(s): VITAMINB12, FOLATE, FERRITIN, TIBC, IRON, RETICCTPCT in the last 72 hours. Urinalysis    Component Value Date/Time   COLORURINE RED (A) 02/06/2017 1215   APPEARANCEUR CLOUDY (A) 02/06/2017 1215   LABSPEC  02/06/2017 1215    TEST NOT REPORTED DUE TO COLOR INTERFERENCE OF URINE PIGMENT   PHURINE  02/06/2017 1215    TEST NOT REPORTED DUE TO COLOR INTERFERENCE OF URINE PIGMENT   GLUCOSEU (A) 02/06/2017 1215    TEST NOT REPORTED DUE TO COLOR INTERFERENCE OF URINE PIGMENT   HGBUR (A) 02/06/2017 1215    TEST NOT REPORTED DUE TO COLOR INTERFERENCE OF URINE PIGMENT   BILIRUBINUR (A) 02/06/2017 1215    TEST NOT REPORTED DUE TO COLOR INTERFERENCE OF URINE PIGMENT   KETONESUR (A)  02/06/2017 1215    TEST NOT REPORTED DUE TO COLOR INTERFERENCE OF URINE PIGMENT   PROTEINUR (A) 02/06/2017 1215    TEST NOT REPORTED DUE TO COLOR INTERFERENCE OF URINE PIGMENT   NITRITE (A) 02/06/2017 1215    TEST NOT REPORTED DUE TO COLOR INTERFERENCE OF URINE PIGMENT   LEUKOCYTESUR (A) 02/06/2017 1215    TEST NOT REPORTED DUE TO COLOR INTERFERENCE OF URINE PIGMENT   Sepsis Labs Invalid input(s): PROCALCITONIN,  WBC,  LACTICIDVEN Microbiology Recent Results (from the past 240 hour(s))  Culture, blood (routine x 2)     Status: None (Preliminary result)   Collection Time: 08/18/18  1:30 AM  Result Value Ref Range Status   Specimen Description BLOOD RIGHT ARM  Final   Special Requests   Final    BOTTLES DRAWN AEROBIC AND ANAEROBIC Blood Culture adequate volume   Culture   Final    NO GROWTH 1 DAY Performed at [facility], [place] [address] [place], [place] [address]    Report Status PENDING  Incomplete  Culture, blood (routine x 2)     Status: None (Preliminary result)   Collection Time: 08/18/18  1:45 AM  Result Value Ref Range Status   Specimen Description BLOOD RIGHT HAND  Final   Special Requests   Final    BOTTLES DRAWN AEROBIC ONLY Blood Culture results may not be optimal due to an excessive volume of blood received in culture bottles   Culture   Final    NO GROWTH 1 DAY Performed at [facility], [place] [address], [place],  [address]    Report Status PENDING  Incomplete    SIGNED:   Cordelia Poche, MD [facility] 08/19/2018, 3:39 PM

## 2018-08-19 NOTE — Discharge Instructions (Signed)
Lawrence [name],  You were here with chest pain. This does not appear to be secondary to your heart. Please follow-up with your primary care physician.

## 2018-08-19 NOTE — Plan of Care (Signed)
  Problem: Activity: Goal: Risk for activity intolerance will decrease Outcome: Progressing   Problem: Nutrition: Goal: Adequate nutrition will be maintained Outcome: Progressing   Problem: Coping: Goal: Level of anxiety will decrease Outcome: Progressing   

## 2018-08-19 NOTE — Progress Notes (Signed)
RN spoke with Rosaria Ferries, PA cardiology.  Patient okay to discharge per PA. Marcille Blanco, RN

## 2018-08-19 NOTE — Care Management (Signed)
[facility] called for pick up, patient is 5th in line, his forms and gold DNR are on his chart.

## 2018-08-22 ENCOUNTER — Encounter: Payer: Self-pay | Admitting: Gastroenterology

## 2018-08-22 ENCOUNTER — Ambulatory Visit (INDEPENDENT_AMBULATORY_CARE_PROVIDER_SITE_OTHER): Payer: Medicaid Other | Admitting: Gastroenterology

## 2018-08-22 ENCOUNTER — Other Ambulatory Visit (INDEPENDENT_AMBULATORY_CARE_PROVIDER_SITE_OTHER): Payer: No Typology Code available for payment source

## 2018-08-22 VITALS — BP 124/84 | HR 88

## 2018-08-22 DIAGNOSIS — D649 Anemia, unspecified: Secondary | ICD-10-CM

## 2018-08-22 DIAGNOSIS — Z860100 Personal history of colon polyps, unspecified: Secondary | ICD-10-CM

## 2018-08-22 DIAGNOSIS — Z66 Do not resuscitate: Secondary | ICD-10-CM | POA: Diagnosis not present

## 2018-08-22 DIAGNOSIS — D369 Benign neoplasm, unspecified site: Secondary | ICD-10-CM

## 2018-08-22 LAB — CBC
HCT: 39.7 % (ref 39.0–52.0)
Hemoglobin: 13.4 g/dL (ref 13.0–17.0)
MCHC: 33.6 g/dL (ref 30.0–36.0)
MCV: 84.9 fl (ref 78.0–100.0)
Platelets: 291 10*3/uL (ref 150.0–400.0)
RBC: 4.68 Mil/uL (ref 4.22–5.81)
RDW: 14.9 % (ref 11.5–15.5)
WBC: 12 10*3/uL — ABNORMAL HIGH (ref 4.0–10.5)

## 2018-08-22 LAB — B12 AND FOLATE PANEL
Folate: 14.8 ng/mL (ref >5.9)
Vitamin B-12: 225 pg/mL (ref 211–911)

## 2018-08-22 LAB — IGA: IgA: 162 mg/dL (ref 68–378)

## 2018-08-22 LAB — IBC PANEL
Iron: 49 ug/dL (ref 42–165)
Saturation Ratios: 13.2 % — ABNORMAL LOW (ref 20.0–50.0)
Transferrin: 265 mg/dL (ref 212.0–360.0)

## 2018-08-22 LAB — PROTIME-INR
INR: 1 ratio (ref 0.8–1.0)
Prothrombin Time: 11.8 s (ref 9.6–13.1)

## 2018-08-22 LAB — FERRITIN: Ferritin: 166.4 ng/mL (ref 22.0–322.0)

## 2018-08-22 NOTE — Patient Instructions (Signed)
Normal BMI (Body Mass Index- based on height and weight) is between 19 and 25. Your BMI today is There is no height or weight on file to calculate BMI. [name] Please consider follow up  regarding your BMI with your Primary Care Provider.  Your provider has requested that you go to the basement level for lab work before leaving today. Press "B" on the elevator. The lab is located at the first door on the left as you exit the elevator.  Thank you for entrusting me with your care and choosing [facility] care.  Dr Rush Landmark

## 2018-08-22 NOTE — Progress Notes (Unsigned)
reti

## 2018-08-22 NOTE — Progress Notes (Signed)
[place], [place] [address] [place] [address] [phone]  Referring Provider Center, [place] [address] [place], [place] [address] [phone]  Patient Profile: Lawrence [name] is a 63 y.o. male with a pmh significant for CAD, heart failure, asthma/COPD, anxiety/MDD, morbid obesity, PTSD, sleep apnea, hypertension, BPH, hyperlipidemia, diabetes, functionally paraplegic (as result of severe arthritis of the knees and has to use a walker/scooter for mobility), DNR status.  The patient presents to the [facility] for an evaluation and management of problem(s) noted below:  Problem List 1. Anemia, unspecified type   2. History of colon polyps   3. Do not resuscitate status     History of Present Illness: This is the patient's first visit to the outpatient [place] clinic.  He was initially referred through the [place] in September of this year for evaluation of anemia and in the notation it was reported iron deficiency.  With that being said the patient has multiple medical comorbidities and has had multiple hospitalizations in the setting of chest pain with work-up unrevealing and no further cardiac work-up performed because of recent catheterization in the latter portion of 2019.  He was plan to follow-up with his [place] providers and cardiologist.  When I speak with the patient today with his family member present they are adamant that he is not had any issues with rectal bleeding or melena.  He is denying any issues of hemorrhoids.  He has bowel movements on a daily basis and sometimes every other day but mostly on a daily basis.  His stool consistency is formed and there is no hardness to things.  He describes a prior history of a colonoscopy in 2007 done in the [place] while he was living in [place].  With a reported adenoma.  He has no family history of colon cancer or colon polyps.   He did not have a follow-up colonoscopy since he has been doing well.  He now lives in this region and goes to the [place] at [place].  He had never been told that he had iron deficiency and he is never been on iron.  Most recent set of labs showed a hemoglobin in the 12 range.  The patient denies any significant abdominal pain, nausea, vomiting, change in bowel habits.  He is very hesitant about any procedures because he has a Foley order DO NOT RESUSCITATE order that he does not want to be rescinded.  He is never had an upper endoscopy.  He also is worried about how he would even prepare for a colonoscopy since he is a high fall risk and it takes him multiple minutes to even get up out of bed to be moved into his walker/scooter and he is concerned about having problems from that standpoint.  GI Review of Systems Positive as above including mild pyrosis Negative for dysphagia, odynophagia, jaundice, early satiety, weight loss  Review of Systems General: Denies fevers/chills HEENT: Denies oral lesions Cardiovascular: Denies current chest pain/palpitations Pulmonary: Shortness of breath at baseline Gastroenterological: See HPI Genitourinary: Denies darkened urine or hematuria Hematological: Denies easy bruising/bleeding Endocrine: Denies temperature intolerance Dermatological: Denies jaundice Psychological: Mood is stable   Medications Current Outpatient Medications  Medication Sig Dispense Refill  . acetaminophen (TYLENOL) 500 MG tablet Take 500 mg by mouth 4 (four) times daily as needed for mild pain.     [name] albuterol (PROVENTIL HFA;VENTOLIN HFA) 108 (90 Base) MCG/ACT inhaler Inhale 2  puffs into the lungs every 6 (six) hours as needed for wheezing or shortness of breath.    . allopurinol (ZYLOPRIM) 100 MG tablet Take 1 tablet (100 mg total) by mouth daily. 30 tablet 0  . ARIPiprazole (ABILIFY) 5 MG tablet Take 5 mg by mouth at bedtime.     [name] aspirin EC 81 MG EC tablet Take 1 tablet (81 mg  total) by mouth daily.    [name] atorvastatin (LIPITOR) 40 MG tablet Take 1 tablet (40 mg total) by mouth daily at 6 PM. 30 tablet 0  . blood glucose meter kit and supplies Dispense based on patient and insurance preference. Use up to four times daily as directed. (FOR ICD-10 E10.9, E11.9). 1 each 0  . cephALEXin (KEFLEX) 500 MG capsule Take 1 capsule (500 mg total) by mouth 4 (four) times daily for 5 days. 20 capsule 0  . cetirizine (ZYRTEC) 10 MG tablet Take 10 mg by mouth daily.    . fluticasone (FLONASE) 50 MCG/ACT nasal spray Place 1 spray into both nostrils daily.     . furosemide (LASIX) 80 MG tablet Take 80 mg by mouth 2 (two) times daily.    [name] gabapentin (NEURONTIN) 300 MG capsule Take 300-600 mg by mouth See admin instructions. Take 1 capsule every morning and at 3PM then take 2 capsules at bedtime(9pm)    . lisinopril (PRINIVIL,ZESTRIL) 10 MG tablet Take 10 mg by mouth daily.    [name] loperamide (IMODIUM) 2 MG capsule Take 2 mg by mouth as needed for diarrhea or loose stools.    . metFORMIN (GLUCOPHAGE) 500 MG tablet Take 1 tablet (500 mg total) by mouth 2 (two) times daily with a meal. 60 tablet 0  . prazosin (MINIPRESS) 2 MG capsule Take 4 mg by mouth at bedtime.    . silver sulfADIAZINE (SILVADENE) 1 % cream Apply 1 application topically daily. Una boot (both legs)    . temazepam (RESTORIL) 15 MG capsule Take 15 mg by mouth at bedtime.     . traZODone (DESYREL) 100 MG tablet Take 200 mg by mouth at bedtime.    . TRIAMCINOLONE ACETONIDE,NASAL, NA Place 2 sprays into both nostrils every evening.     Loura Pardon Salicylate (ASPERCREME EX) Apply 1 application topically 2 (two) times daily as needed (pain and inflammation).    . venlafaxine XR (EFFEXOR-XR) 150 MG 24 hr capsule Take 150 mg by mouth daily with breakfast.     No current facility-administered medications for this visit.     Allergies Allergies  Allergen Reactions  . Tubersol [Tuberculin] Other (See Comments)    Reaction unknown   . Vancomycin Other (See Comments)    Red Man Syndrome  . Doxycycline Rash    Histories Past Medical History:  Diagnosis Date  . Adenomatous colon polyp 2007  . Anemia   . Anxiety   . Arthritis   . Asthma   . Chronic cough   . Chronic diastolic CHF (congestive heart failure) ([place])   . COPD (chronic obstructive pulmonary disease) ([place])   . Depression   . History of cardiac cath    a.  01/2018 with angiographically minimal coronary disease with  up to 40% prox LAD and 20% mid LCx and OM2 with normal LVF and normal LVEDP..  . Hypertension   . Morbid obesity ([place])   . New onset type 2 diabetes mellitus ([place]) 11/08/2017  . PTSD (post-traumatic stress disorder)   . Sleep apnea    CPAP   Past  Surgical History:  Procedure Laterality Date  . JOINT REPLACEMENT     left knee replacement x 2  . KNEE ARTHROSCOPY Bilateral   . LEFT HEART CATH AND CORONARY ANGIOGRAPHY N/A 01/17/2018   Procedure: LEFT HEART CATH AND CORONARY ANGIOGRAPHY;  Surgeon: Leonie Man, MD;  Location: [facility];  Service: Cardiovascular;  Laterality: N/A;  . UMBILICAL HERNIA REPAIR     Social History   Socioeconomic History  . Marital status: Single    Spouse name: Not on file  . Number of children: 1  . Years of education: Not on file  . Highest education level: Not on file  Occupational History  . Occupation: retired  Scientific laboratory technician  . Financial resource strain: Not on file  . Food insecurity:    Worry: Not on file    Inability: Not on file  . Transportation needs:    Medical: Not on file    Non-medical: Not on file  Tobacco Use  . Smoking status: Former Smoker    Last attempt to quit: 03/07/2016    Years since quitting: 2.4  . Smokeless tobacco: Never Used  Substance and Sexual Activity  . Alcohol use: Yes    Comment: rare  . Drug use: No  . Sexual activity: Not on file  Lifestyle  . Physical activity:    Days per week: Not on file    Minutes per session: Not on file  . Stress: Not  on file  Relationships  . Social connections:    Talks on phone: Not on file    Gets together: Not on file    Attends religious service: Not on file    Active member of club or organization: Not on file    Attends meetings of clubs or organizations: Not on file    Relationship status: Not on file  . Intimate partner violence:    Fear of current or ex partner: Not on file    Emotionally abused: Not on file    Physically abused: Not on file    Forced sexual activity: Not on file  Other Topics Concern  . Not on file  Social History Narrative  . Not on file   Family History  Problem Relation Age of Onset  . CAD Maternal Grandfather   . Diabetes Other   . Diabetes Mellitus II Neg Hx   . Colon cancer Neg Hx   . Esophageal cancer Neg Hx   . Inflammatory bowel disease Neg Hx   . Liver disease Neg Hx   . Pancreatic cancer Neg Hx   . Rectal cancer Neg Hx   . Stomach cancer Neg Hx    I have reviewed his medical, social, and family history in detail and updated the electronic medical record as necessary.    PHYSICAL EXAMINATION  BP 124/84 (BP Location: Right Wrist, Patient Position: Sitting, Cuff Size: Normal)   Pulse 88  Wt Readings from Last 3 Encounters:  08/19/18 (!) 392 lb 3.2 oz (177.9 kg)  02/15/18 (!) 389 lb (176.4 kg)  01/18/18 (!) 399 lb 14.6 oz (181.4 kg)  GEN: NAD, morbidly obese, on scooter, accompanied by family member PSYCH: Cooperative, without pressured speech EYE: Conjunctivae pink, sclerae anicteric ENT: MMM, without oral ulcers NECK: Enlarged neck girth CV: RR without R/Gs  RESP: No adventitious sounds present GI: NABS, soft, obese, rounded, NT, without rebound or guarding, hepatosplenomegaly unable to be appreciated due to body habitus MSK/EXT: Bilateral lower extremity edema present SKIN: No jaundice  NEURO:  Alert & Oriented x 3   REVIEW OF DATA  I reviewed the following data at the time of this encounter:  GI Procedures and Studies  No relevant  studies to review  Laboratory Studies  Reviewed in epic and his referral packet did not have any laboratories in it as noted in review of records below  Imaging Studies  June 2019 TTE Study Conclusions - Left ventricle: The cavity size was normal. Systolic function was   normal. The estimated ejection fraction was in the range of 60%   to 65%. Wall motion was normal; there were no regional wall   motion abnormalities. Features are consistent with a pseudonormal   left ventricular filling pattern, with concomitant abnormal   relaxation and increased filling pressure (grade 2 diastolic   dysfunction). Doppler parameters are consistent with elevated   ventricular end-diastolic filling pressure. - Aortic valve: Transvalvular velocity was minimally increased.   There was no stenosis. There was no regurgitation. - Aortic root: The aortic root was normal in size. - Mitral valve: There was no regurgitation. - Right ventricle: Systolic function was normal. - Tricuspid valve: There was no regurgitation. - Pulmonic valve: There was no regurgitation. - Pulmonary arteries: Systolic pressure could not be accurately   estimated. - Inferior vena cava: The vessel was normal in size. - Pericardium, extracardiac: There was no pericardial effusion.  Review of available outside records  The [facility] was reviewed and will be scanned into the chart. The history of information which was dated from referral date of September 2019 was for anemia with drop in hemoglobin and history of adenomatous polyp in 2007 Documentation of the note from a 05/03/2018 primary care visit suggested that in his assessment and plan that there was iron deficiency anemia and that he was going to be referred to GI.  There is another notation from March 26, 2018 at suggested iron deficiency anemia with referral to ED for evaluation and management. However, there is no laboratories to substantiate this.  The patient states  he has never been told he had iron deficiency.  These notes will be scanned into the chart   ASSESSMENT  Mr. Whobrey is a 63 y.o. male with a pmh significant for CAD, heart failure, asthma/COPD, anxiety/MDD, morbid obesity, PTSD, sleep apnea, hypertension, BPH, hyperlipidemia, diabetes, functionally paraplegic (as result of severe arthritis of the knees and has to use a walker/scooter for mobility), DNR status.  The patient is seen today for evaluation and management of:  1. Anemia, unspecified type   2. History of colon polyps   3. Do not resuscitate status    This is a hemodynamically stable patient who was referred by the [facility] for concern of iron deficiency anemia.  With that being said, we have no evidence of the patient actually having iron deficiency.  His most recent hemogram within the last week shows a hemoglobin in the 12 range.  His MCV is normal.  He has never been on iron supplementation.  He does have a history of prior colon polyps that are documented in the [place] system going back to 2007 however he has not had any repeat colonoscopy or procedures done because of his own preference.  Patient denies any significant GI complaints either.  He has no evidence of hematochezia or occult blood loss.  The patient is also fully committed to a DO NOT RESUSCITATE order that would not be able to be rescinded for any reason.  In my practice  this is not something that I pursue in patients who are having elective procedures and thus will need to continue to have discussions with the patient if a procedure or procedures were to be necessary for this patient.  He is also a very high risk individual because of his multiple medical comorbidities.  Due to his functional paraplegia as a result of his medical issues he is going to have a very difficult time prepping for any type of colonoscopy if that were to be considered in the future.  The patient and family member also adamant about making sure there is a real  reason to do things rather than just getting him scheduled for something.  I think this is reasonable.  We should obtain a blood count as well as iron indices to truly evaluate whether this patient has a true iron deficiency and/or anemia is present.  I spoke briefly with the patient about the roles of upper and lower endoscopies for evaluation of iron deficiency anemia if he is found to have that.  We also discussed the possibility of iron supplementation and close monitoring of his hemoglobin.  If the patient has a true significant iron deficiency anemia then I think diagnostic procedures are reasonable however I would have to find a common ground with the patient about his DNR status.  Again my role as a gastroenterologist is to do what is right for the patient when the procedure is necessary and I am not sure that our anesthesia colleagues would feel comfortable with doing his case without having DNR rescinded and thus that would require further discussions as well.  For now we will get labs.  We can consider the role of a: If he does not have overt anemia as a means of trying to understand his risk for colon cancer.  We may even consider the role of a CT scan.  The patient is happy to work things up slowly since he is not overtly bleeding.  All patient questions were answered, to the best of my ability, and the patient agrees to the aforementioned plan of action with follow-up as indicated.   PLAN  Laboratories as outlined below Based on the laboratory evaluation we will consider the role of diagnostic procedures if he has true iron deficiency with anemia We will need to have a thorough discussion with the patient about his DO NOT RESUSCITATE order if diagnostic procedures were to be considered May consider the role of Cologuard testing and/or CT imaging to help guide [place] If he needs diagnostic procedures he would need to be prepared in the hospital and we would have to gain insurance approval to the [place]  for this to occur Further management to be dictated by labs   Orders Placed This Encounter  Procedures  . CBC  . IBC panel  . Ferritin  . Reticulocytes  . Haptoglobin  . B12 and Folate Panel  . Tissue transglutaminase, IgA  . IgA  . Protime-INR    New Prescriptions   No medications on file   Modified Medications   No medications on file    Planned Follow Up: No follow-ups on file.   Justice Britain, MD [facility] Office # [phone]

## 2018-08-23 LAB — CULTURE, BLOOD (ROUTINE X 2)
Culture: NO GROWTH
Culture: NO GROWTH
Special Requests: ADEQUATE

## 2018-08-23 LAB — TISSUE TRANSGLUTAMINASE, IGA: (tTG) Ab, IgA: 1 U/mL

## 2018-08-23 LAB — EXTRA LAV TOP TUBE

## 2018-08-24 ENCOUNTER — Encounter: Payer: Self-pay | Admitting: Gastroenterology

## 2018-08-24 DIAGNOSIS — Z860100 Personal history of colon polyps, unspecified: Secondary | ICD-10-CM | POA: Insufficient documentation

## 2018-08-24 DIAGNOSIS — D649 Anemia, unspecified: Secondary | ICD-10-CM | POA: Insufficient documentation

## 2018-08-24 DIAGNOSIS — Z66 Do not resuscitate: Secondary | ICD-10-CM | POA: Insufficient documentation

## 2018-08-24 HISTORY — DX: Personal history of colon polyps, unspecified: Z86.0100

## 2018-08-24 HISTORY — DX: Personal history of colonic polyps: Z86.010

## 2018-08-27 ENCOUNTER — Other Ambulatory Visit: Payer: Self-pay

## 2018-08-27 ENCOUNTER — Telehealth: Payer: Self-pay | Admitting: Gastroenterology

## 2018-08-27 DIAGNOSIS — D649 Anemia, unspecified: Secondary | ICD-10-CM

## 2018-08-27 NOTE — Telephone Encounter (Signed)
PT called states that someone from our office called him this morning.. I see that he has to get labs done.. I told the PT that I would have you call him back.Lavone Nian

## 2018-08-27 NOTE — Telephone Encounter (Signed)
The patient has been notified of this information and all questions answered.

## 2018-12-24 ENCOUNTER — Encounter: Payer: Self-pay | Admitting: General Surgery

## 2018-12-25 ENCOUNTER — Ambulatory Visit (INDEPENDENT_AMBULATORY_CARE_PROVIDER_SITE_OTHER): Payer: Medicaid Other | Admitting: Gastroenterology

## 2018-12-25 ENCOUNTER — Other Ambulatory Visit: Payer: Self-pay

## 2018-12-25 DIAGNOSIS — Z860100 Personal history of colon polyps, unspecified: Secondary | ICD-10-CM

## 2018-12-25 DIAGNOSIS — Z66 Do not resuscitate: Secondary | ICD-10-CM | POA: Diagnosis not present

## 2018-12-25 DIAGNOSIS — Z862 Personal history of diseases of the blood and blood-forming organs and certain disorders involving the immune mechanism: Secondary | ICD-10-CM | POA: Diagnosis not present

## 2018-12-25 DIAGNOSIS — E611 Iron deficiency: Secondary | ICD-10-CM

## 2018-12-25 NOTE — Progress Notes (Signed)
[place] VISIT   Primary Care Provider [place], [place] [address] [place] [address] [phone]  Patient Profile: Lawrence [name] is a 63 y.o. male with a pmh significant for CAD, heart failure, asthma/COPD, anxiety/MDD, morbid obesity, PTSD, sleep apnea, hypertension, BPH, hyperlipidemia, diabetes, functionally paraplegic (as result of severe arthritis of the knees and has to use a walker/scooter for mobility), iron insufficiency, DNR status.  The patient presents to the [facility] for an evaluation and management of problem(s) noted below:  Problem List 1. History of anemia   2. Iron deficiency   3. History of colon polyps   4. Do not resuscitate status     History of Present Illness: Please see initial consultation note for full details of HPI.  I connected with  [name] on 12/26/18. I verified that I was speaking with the correct person using two identifiers. Due to the COVID-19 Pandemic, this service was provided via telemedicine using audio only. Interactive audio and video telecommunications were attempted between this provider and patient, however failed, due to patient having technical difficulties. The patient was located at home. The provider was located in the office. The patient did consent to this visit and is aware of charges through their insurance as well as the limitations of evaluation and management by telemedicine. Other persons participating in this telemedicine service were none. Time spent on visit was greater than 25 minutes on the phone and coordination of care.  Interval History The patient has done well over the course the last few months.  He is basically staying at home as result of the COVID-19 pandemic.  He has not had any recurrent hospitalizations for shortness of breath over the course of the last 5 months.  The patient denies any gastrointestinal bleeding (melena or  hematochezia).  Patient is not having any GI symptoms as noted below.  He did not have repeat laboratories to evaluate his iron studies as well as blood counts prior to today's telehealth visit.  GI Review of Systems Positive as above including Negative for dysphagia, odynophagia, early satiety, abdominal pain, unintentional weight loss, change in bowel habits  Review of Systems General: Denies fevers/chills Cardiovascular: Denies current chest pain Pulmonary: Shortness of breath remains at baseline Gastroenterological: See HPI Genitourinary: Denies darkened urine or hematuria Hematological: Denies easy bruising Dermatological: Denies jaundice Psychological: Mood is stable   Medications Current Outpatient Medications  Medication Sig Dispense Refill   acetaminophen (TYLENOL) 500 MG tablet Take 500 mg by mouth 4 (four) times daily as needed for mild pain.      albuterol (PROVENTIL HFA;VENTOLIN HFA) 108 (90 Base) MCG/ACT inhaler Inhale 2 puffs into the lungs every 6 (six) hours as needed for wheezing or shortness of breath.     allopurinol (ZYLOPRIM) 100 MG tablet Take 1 tablet (100 mg total) by mouth daily. 30 tablet 0   ARIPiprazole (ABILIFY) 5 MG tablet Take 5 mg by mouth at bedtime.      aspirin EC 81 MG EC tablet Take 1 tablet (81 mg total) by mouth daily.     atorvastatin (LIPITOR) 40 MG tablet Take 1 tablet (40 mg total) by mouth daily at 6 PM. 30 tablet 0   blood glucose meter kit and supplies Dispense based on patient and insurance preference. Use up to four times daily as directed. (FOR ICD-10 E10.9, E11.9). 1 each 0   cetirizine (ZYRTEC) 10 MG tablet Take 10 mg by mouth daily.     ferrous sulfate  324 (65 Fe) MG TBEC Take by mouth.     fluticasone (FLONASE) 50 MCG/ACT nasal spray Place 1 spray into both nostrils daily.      furosemide (LASIX) 80 MG tablet Take 80 mg by mouth 2 (two) times daily.     gabapentin (NEURONTIN) 300 MG capsule Take 300-600 mg by mouth See  admin instructions. Take 1 capsule every morning and at 3PM then take 2 capsules at bedtime(9pm)     lisinopril (PRINIVIL,ZESTRIL) 10 MG tablet Take 10 mg by mouth daily.     loperamide (IMODIUM) 2 MG capsule Take 2 mg by mouth as needed for diarrhea or loose stools.     metFORMIN (GLUCOPHAGE) 500 MG tablet Take 1 tablet (500 mg total) by mouth 2 (two) times daily with a meal. 60 tablet 0   prazosin (MINIPRESS) 2 MG capsule Take 4 mg by mouth at bedtime.     pregabalin (LYRICA) 100 MG capsule Take 100 mg by mouth 4 (four) times daily.     silver sulfADIAZINE (SILVADENE) 1 % cream Apply 1 application topically daily. Una boot (both legs)     temazepam (RESTORIL) 15 MG capsule Take 15 mg by mouth at bedtime.      traZODone (DESYREL) 100 MG tablet Take 200 mg by mouth at bedtime.     TRIAMCINOLONE ACETONIDE,NASAL, NA Place 2 sprays into both nostrils every evening.      Trolamine Salicylate (ASPERCREME EX) Apply 1 application topically 2 (two) times daily as needed (pain and inflammation).     venlafaxine XR (EFFEXOR-XR) 150 MG 24 hr capsule Take 150 mg by mouth daily with breakfast.     No current facility-administered medications for this visit.     Allergies Allergies  Allergen Reactions   Tubersol [Tuberculin] Other (See Comments)    Reaction unknown   Vancomycin Other (See Comments)    Red Man Syndrome   Doxycycline Rash    Histories Past Medical History:  Diagnosis Date   Adenomatous colon polyp 2007   Anemia    Anxiety    Arthritis    Asthma    Chronic cough    Chronic diastolic CHF (congestive heart failure) (HCC)    COPD (chronic obstructive pulmonary disease) (HCC)    Depression    History of cardiac cath    a.  01/2018 with angiographically minimal coronary disease with  up to 40% prox LAD and 20% mid LCx and OM2 with normal LVF and normal LVEDP.[name]   Hypertension    Morbid obesity ([place])    New onset type 2 diabetes mellitus ([place]) 11/08/2017    PTSD (post-traumatic stress disorder)    Sleep apnea    CPAP   Past Surgical History:  Procedure Laterality Date   JOINT REPLACEMENT     left knee replacement x 2   KNEE ARTHROSCOPY Bilateral    LEFT HEART CATH AND CORONARY ANGIOGRAPHY N/A 01/17/2018   Procedure: LEFT HEART CATH AND CORONARY ANGIOGRAPHY;  Surgeon: Leonie Man, MD;  Location: [facility];  Service: Cardiovascular;  Laterality: N/A;   UMBILICAL HERNIA REPAIR     Social History   Socioeconomic History   Marital status: Single    Spouse name: Not on file   Number of children: 1   Years of education: Not on file   Highest education level: Not on file  Occupational History   Occupation: retired  Scientist, product/process development strain: Not on file   Food insecurity:  Worry: Not on file    Inability: Not on file   Transportation needs:    Medical: Not on file    Non-medical: Not on file  Tobacco Use   Smoking status: Former Smoker    Last attempt to quit: 03/07/2016    Years since quitting: 2.8   Smokeless tobacco: Never Used  Substance and Sexual Activity   Alcohol use: Yes    Comment: rare   Drug use: No   Sexual activity: Not on file  Lifestyle   Physical activity:    Days per week: Not on file    Minutes per session: Not on file   Stress: Not on file  Relationships   Social connections:    Talks on phone: Not on file    Gets together: Not on file    Attends religious service: Not on file    Active member of club or organization: Not on file    Attends meetings of clubs or organizations: Not on file    Relationship status: Not on file   Intimate partner violence:    Fear of current or ex partner: Not on file    Emotionally abused: Not on file    Physically abused: Not on file    Forced sexual activity: Not on file  Other Topics Concern   Not on file  Social History Narrative   Not on file   Family History  Problem Relation Age of Onset   CAD Maternal  Grandfather    Diabetes Other    Diabetes Mellitus II Neg Hx    Colon cancer Neg Hx    Esophageal cancer Neg Hx    Inflammatory bowel disease Neg Hx    Liver disease Neg Hx    Pancreatic cancer Neg Hx    Rectal cancer Neg Hx    Stomach cancer Neg Hx    I have reviewed his medical, social, and family history in detail and updated the electronic medical record as necessary.    PHYSICAL EXAMINATION  Telehealth visit   REVIEW OF DATA  I reviewed the following data at the time of this encounter:  GI Procedures and Studies  No relevant studies to review  Laboratory Studies  Reviewed in epic Reviewed [facility] records which were obtained prior to telehealth visit and showed no new labs since her last set of labs in January in epic system  Imaging Studies  No new relevant studies to review   ASSESSMENT  Mr. Ryner is a 63 y.o. male with a pmh significant for CAD, heart failure, asthma/COPD, anxiety/MDD, morbid obesity, PTSD, sleep apnea, hypertension, BPH, hyperlipidemia, diabetes, functionally paraplegic (as result of severe arthritis of the knees and has to use a walker/scooter for mobility), DNR status.  The patient is seen today for evaluation and management of:  1. History of anemia   2. Iron deficiency   3. History of colon polyps   4. Do not resuscitate status    The patient remains clinically stable.  There is been no evidence of overt or obscure GI bleeding.  As on our previous discussions he is a high risk individual for any type of endoscopic evaluation and as he is adamant about maintaining his DO NOT RESUSCITATE order we must be able to truly understand whether patient will benefit from an endoscopic evaluation or not.  He did have slight iron insufficiency when last checked however he did not have any evidence of anemia I would like to see what his iron indices  and blood counts look like now.  If they remain stable then I believe monitoring this patient who has  extensive medical comorbidities and not putting him through an endoscopic evaluation is probably the most reasonable next step in his evaluation.  If he has persistent iron insufficiency or has developed iron deficiency we will need to get the patient IV iron infusions and then strongly consider the role of an endoscopic evaluation.  This would include both an upper and lower endoscopy.  If we elect not to pursue any further endoscopic evaluation because he is clinically stable there is a small chance of missing a precancerous or cancerous process and I have discussed this with the patient and he understands and accepts that risk if it were to be the case if it is felt that putting him through endoscopic procedures are higher risk than benefit.  For now, we will see what his repeat labs show and we will try to obtain them through his home health agency of [facility].  All patient questions were answered, to the best of my ability, and the patient agrees to the aforementioned plan of action with follow-up as indicated.   PLAN  Laboratories as outlined below to be drawn by home health in the next 1 to 2 weeks; if issues trying to obtain via home health and he will need to come into the lab at [facility] office Continue oral iron once daily We will consider the role of IV iron infusion based on labs Holding on upper and lower endoscopic evaluation for now but will consider down the road May consider the role of Cologuard testing If he needs diagnostic procedures he would need to be prepared in the hospital and we would have to gain insurance approval to the [place] for this to occur   No orders of the defined types were placed in this encounter.   New Prescriptions   No medications on file   Modified Medications   No medications on file    Planned Follow Up: Return in about 6 weeks (around 02/05/2019).   Justice Britain, MD [facility] Office # [phone]

## 2018-12-25 NOTE — Patient Instructions (Addendum)
Your provider has requested that you go to the basement level for lab work before leaving today. Press "B" on the elevator. The lab is located at the first door on the left as you exit the elevator.  We have faxed an order to [facility] at fax# [phone] Attn: [place].  We will contact you at a later time to schedule your next follow-up appointment.(6 week follow-up).   Thank you for choosing me and [facility].  Dr. Rush Landmark

## 2018-12-26 ENCOUNTER — Encounter: Payer: Self-pay | Admitting: Gastroenterology

## 2018-12-26 DIAGNOSIS — Z862 Personal history of diseases of the blood and blood-forming organs and certain disorders involving the immune mechanism: Secondary | ICD-10-CM | POA: Insufficient documentation

## 2018-12-26 HISTORY — DX: Personal history of diseases of the blood and blood-forming organs and certain disorders involving the immune mechanism: Z86.2

## 2019-02-19 ENCOUNTER — Emergency Department (HOSPITAL_COMMUNITY): Payer: No Typology Code available for payment source

## 2019-02-19 ENCOUNTER — Emergency Department (HOSPITAL_COMMUNITY)
Admission: EM | Admit: 2019-02-19 | Discharge: 2019-02-19 | Disposition: A | Discharge status: Home / Self Care | Payer: No Typology Code available for payment source | Attending: Emergency Medicine | Admitting: Emergency Medicine

## 2019-02-19 ENCOUNTER — Other Ambulatory Visit: Payer: Self-pay

## 2019-02-19 DIAGNOSIS — I110 Hypertensive heart disease with heart failure: Secondary | ICD-10-CM | POA: Diagnosis not present

## 2019-02-19 DIAGNOSIS — J449 Chronic obstructive pulmonary disease, unspecified: Secondary | ICD-10-CM | POA: Diagnosis not present

## 2019-02-19 DIAGNOSIS — R5383 Other fatigue: Secondary | ICD-10-CM | POA: Insufficient documentation

## 2019-02-19 DIAGNOSIS — Z79899 Other long term (current) drug therapy: Secondary | ICD-10-CM | POA: Diagnosis not present

## 2019-02-19 DIAGNOSIS — R000 Tachycardia, unspecified: Secondary | ICD-10-CM | POA: Diagnosis present

## 2019-02-19 DIAGNOSIS — R002 Palpitations: Secondary | ICD-10-CM | POA: Insufficient documentation

## 2019-02-19 DIAGNOSIS — E1140 Type 2 diabetes mellitus with diabetic neuropathy, unspecified: Secondary | ICD-10-CM | POA: Insufficient documentation

## 2019-02-19 DIAGNOSIS — Z7982 Long term (current) use of aspirin: Secondary | ICD-10-CM | POA: Insufficient documentation

## 2019-02-19 DIAGNOSIS — Z96652 Presence of left artificial knee joint: Secondary | ICD-10-CM | POA: Insufficient documentation

## 2019-02-19 DIAGNOSIS — Z7984 Long term (current) use of oral hypoglycemic drugs: Secondary | ICD-10-CM | POA: Insufficient documentation

## 2019-02-19 DIAGNOSIS — I5032 Chronic diastolic (congestive) heart failure: Secondary | ICD-10-CM | POA: Diagnosis not present

## 2019-02-19 DIAGNOSIS — R079 Chest pain, unspecified: Secondary | ICD-10-CM | POA: Insufficient documentation

## 2019-02-19 DIAGNOSIS — Z87891 Personal history of nicotine dependence: Secondary | ICD-10-CM | POA: Diagnosis not present

## 2019-02-19 DIAGNOSIS — R0789 Other chest pain: Secondary | ICD-10-CM

## 2019-02-19 LAB — BRAIN NATRIURETIC PEPTIDE: B Natriuretic Peptide: 38.6 pg/mL (ref 0.0–100.0)

## 2019-02-19 LAB — CBC WITH DIFFERENTIAL/PLATELET
Abs Immature Granulocytes: 0.08 10*3/uL — ABNORMAL HIGH (ref 0.00–0.07)
Basophils Absolute: 0 10*3/uL (ref 0.0–0.1)
Basophils Relative: 0 %
Eosinophils Absolute: 0.5 10*3/uL (ref 0.0–0.5)
Eosinophils Relative: 4 %
HCT: 40.2 % (ref 39.0–52.0)
Hemoglobin: 13 g/dL (ref 13.0–17.0)
Immature Granulocytes: 1 %
Lymphocytes Relative: 12 %
Lymphs Abs: 1.6 10*3/uL (ref 0.7–4.0)
MCH: 28.4 pg (ref 26.0–34.0)
MCHC: 32.3 g/dL (ref 30.0–36.0)
MCV: 87.8 fL (ref 80.0–100.0)
Monocytes Absolute: 0.7 10*3/uL (ref 0.1–1.0)
Monocytes Relative: 5 %
Neutro Abs: 11.1 10*3/uL — ABNORMAL HIGH (ref 1.7–7.7)
Neutrophils Relative %: 78 %
Platelets: 294 10*3/uL (ref 150–400)
RBC: 4.58 MIL/uL (ref 4.22–5.81)
RDW: 14 % (ref 11.5–15.5)
WBC: 14 10*3/uL — ABNORMAL HIGH (ref 4.0–10.5)
nRBC: 0 % (ref 0.0–0.2)

## 2019-02-19 LAB — COMPREHENSIVE METABOLIC PANEL
ALT: 26 U/L (ref 0–44)
AST: 18 U/L (ref 15–41)
Albumin: 3.5 g/dL (ref 3.5–5.0)
Alkaline Phosphatase: 71 U/L (ref 38–126)
Anion gap: 13 (ref 5–15)
BUN: 17 mg/dL (ref 8–23)
CO2: 26 mmol/L (ref 22–32)
Calcium: 8.9 mg/dL (ref 8.9–10.3)
Chloride: 95 mmol/L — ABNORMAL LOW (ref 98–111)
Creatinine, Ser: 0.93 mg/dL (ref 0.61–1.24)
GFR calc Af Amer: >60 mL/min (ref >60)
GFR calc non Af Amer: >60 mL/min (ref >60)
Glucose, Bld: 160 mg/dL — ABNORMAL HIGH (ref 70–99)
Potassium: 3.4 mmol/L — ABNORMAL LOW (ref 3.5–5.1)
Sodium: 134 mmol/L — ABNORMAL LOW (ref 135–145)
Total Bilirubin: 0.5 mg/dL (ref 0.3–1.2)
Total Protein: 6.6 g/dL (ref 6.5–8.1)

## 2019-02-19 LAB — CBG MONITORING, ED: Glucose-Capillary: 152 mg/dL — ABNORMAL HIGH (ref 70–99)

## 2019-02-19 LAB — PROTIME-INR
INR: 0.9 (ref 0.8–1.2)
Prothrombin Time: 12.2 seconds (ref 11.4–15.2)

## 2019-02-19 LAB — TROPONIN I (HIGH SENSITIVITY)
Troponin I (High Sensitivity): 5 ng/L (ref <18)
Troponin I (High Sensitivity): 4 ng/L (ref <18)

## 2019-02-19 LAB — MAGNESIUM: Magnesium: 1.8 mg/dL (ref 1.7–2.4)

## 2019-02-19 MED ORDER — ASPIRIN 81 MG PO CHEW: 324.0000 mg | CHEWABLE_TABLET | Freq: Once | ORAL | Status: DC

## 2019-02-19 MED ORDER — MORPHINE SULFATE (PF) 4 MG/ML IV SOLN
4.0000 mg | Freq: Once | INTRAVENOUS | Status: AC
  Administered 2019-02-19: 4 mg via INTRAVENOUS
  Filled 2019-02-19: qty 1

## 2019-02-19 NOTE — ED Notes (Signed)
Pt called out complaining of chest pain, RN informed.

## 2019-02-19 NOTE — ED Triage Notes (Signed)
Pt BIB [facility] after having two episodes of tachycardia in the 140's. Per EMS patient was having chest palpitations at home and his home nurse checked patient's pulse, EMS was called and patient's heart rate was normal at that time. Patient then had an additional episode and EMS was called back. EMS reports patient did not have any episodes of tachycardia for them. Pt also had some intermittent chest pain with the episodes but denies any pain at present time. Pt is sinus in the 90's at present time.

## 2019-02-19 NOTE — ED Notes (Signed)
Discharge instructions and follow up care discussed pt. Pt verbalized understanding.   Pt to go home with [facility]

## 2019-02-19 NOTE — ED Provider Notes (Signed)
[facility] EMERGENCY DEPARTMENT Provider Note   CSN: [id number] Arrival date & time: 02/19/19  1740    History   Chief Complaint Chief Complaint  Patient presents with  . Tachycardia  . Chest Pain    HPI Lawrence [name] is a 63 y.o. male.     HPI Patient presents from home due to concern of tachycardia. Patient notes history of palpitations, notes that they were more severe, more prolonged today, with 2 reported episodes of heart rate greater than 140 This was monitored by the patient's home health nurse. EMS reports that the patient did not have arrhythmia on monitor during my evaluation, nor in transport per Patient notes that he has had some intermittent chest pain with the episodes, but none currently. He notes that he has had worsening generalized fatigue, without focal weakness. He has multiple medical issues including CHF, COPD, nonhealing leg wounds, which were treated today by home health nursing, with fresh bandages applied. He denies fever, cough, vomiting, diarrhea. Past Medical History:  Diagnosis Date  . Adenomatous colon polyp 2007  . Anemia   . Anxiety   . Arthritis   . Asthma   . Chronic cough   . Chronic diastolic CHF (congestive heart failure) ([place])   . COPD (chronic obstructive pulmonary disease) ([place])   . Depression   . History of cardiac cath    a.  01/2018 with angiographically minimal coronary disease with  up to 40% prox LAD and 20% mid LCx and OM2 with normal LVF and normal LVEDP..  . Hypertension   . Morbid obesity ([place])   . New onset type 2 diabetes mellitus ([place]) 11/08/2017  . PTSD (post-traumatic stress disorder)   . Sleep apnea    CPAP    Patient Active Problem List   Diagnosis Date Noted  . History of anemia 12/26/2018  . Anemia 08/24/2018  . History of colon polyps 08/24/2018  . Do not resuscitate status 08/24/2018  . Chronic diastolic CHF (congestive heart failure) ([place]) 08/18/2018  . Leukocytosis 08/18/2018   . Bilateral lower leg cellulitis   . Coronary artery calcification seen on CAT scan   . Pure hypercholesterolemia   . Chest pain   . Shortness of breath   . Acute respiratory failure with hypoxia ([place]) 01/13/2018  . Right foot pain 01/13/2018  . Acute on chronic diastolic CHF (congestive heart failure) ([place]) 01/13/2018  . Diabetes mellitus type 2 in obese ([place]) 01/13/2018  . New onset type 2 diabetes mellitus ([place]) 11/08/2017  . Cellulitis 11/07/2017  . Primary osteoarthritis, left shoulder 03/05/2017  . Essential hypertension, benign 02/22/2017  . BPH without obstruction/lower urinary tract symptoms 02/22/2017  . Hematuria 02/22/2017  . Peripheral neuropathy 02/22/2017  . Physical deconditioning 02/22/2017  . Morbid obesity ([place])   . PTSD (post-traumatic stress disorder)   . Anasarca 01/31/2017  . Bilateral lower extremity edema 01/31/2017  . Cellulitis of lower extremity   . OSA (obstructive sleep apnea)     Past Surgical History:  Procedure Laterality Date  . JOINT REPLACEMENT     left knee replacement x 2  . KNEE ARTHROSCOPY Bilateral   . LEFT HEART CATH AND CORONARY ANGIOGRAPHY N/A 01/17/2018   Procedure: LEFT HEART CATH AND CORONARY ANGIOGRAPHY;  Surgeon: Leonie Man, MD;  Location: [facility];  Service: Cardiovascular;  Laterality: N/A;  . UMBILICAL HERNIA REPAIR          Home Medications    Prior to Admission medications  Medication Sig Start Date End Date Taking? Authorizing Provider  acetaminophen (TYLENOL) 500 MG tablet Take 500 mg by mouth 4 (four) times daily as needed for mild pain.     [provider]  albuterol (PROVENTIL HFA;VENTOLIN HFA) 108 (90 Base) MCG/ACT inhaler Inhale 2 puffs into the lungs every 6 (six) hours as needed for wheezing or shortness of breath.    [provider]  allopurinol (ZYLOPRIM) 100 MG tablet Take 1 tablet (100 mg total) by mouth daily. 01/18/18   Domenic Polite, MD  ARIPiprazole (ABILIFY) 5 MG  tablet Take 5 mg by mouth at bedtime.     [provider]  aspirin EC 81 MG EC tablet Take 1 tablet (81 mg total) by mouth daily. 01/19/18   Domenic Polite, MD  atorvastatin (LIPITOR) 40 MG tablet Take 1 tablet (40 mg total) by mouth daily at 6 PM. 01/18/18   Domenic Polite, MD  blood glucose meter kit and supplies Dispense based on patient and insurance preference. Use up to four times daily as directed. (FOR ICD-10 E10.9, E11.9). 11/09/17   Eugenie Filler, MD  cetirizine (ZYRTEC) 10 MG tablet Take 10 mg by mouth daily.    [provider]  ferrous sulfate 324 (65 Fe) MG TBEC Take by mouth.    [provider]  fluticasone (FLONASE) 50 MCG/ACT nasal spray Place 1 spray into both nostrils daily.     [provider]  furosemide (LASIX) 80 MG tablet Take 80 mg by mouth 2 (two) times daily.    [provider]  gabapentin (NEURONTIN) 300 MG capsule Take 300-600 mg by mouth See admin instructions. Take 1 capsule every morning and at 3PM then take 2 capsules at bedtime(9pm)    [provider]  lisinopril (PRINIVIL,ZESTRIL) 10 MG tablet Take 10 mg by mouth daily.    [provider]  loperamide (IMODIUM) 2 MG capsule Take 2 mg by mouth as needed for diarrhea or loose stools.    [provider]  metFORMIN (GLUCOPHAGE) 500 MG tablet Take 1 tablet (500 mg total) by mouth 2 (two) times daily with a meal. 11/09/17 11/09/18  Eugenie Filler, MD  prazosin (MINIPRESS) 2 MG capsule Take 4 mg by mouth at bedtime.    [provider]  pregabalin (LYRICA) 100 MG capsule Take 100 mg by mouth 4 (four) times daily.    [provider]  silver sulfADIAZINE (SILVADENE) 1 % cream Apply 1 application topically daily. Una boot (both legs)    [provider]  temazepam (RESTORIL) 15 MG capsule Take 15 mg by mouth at bedtime.     [provider]  traZODone (DESYREL) 100 MG tablet Take 200 mg by mouth at bedtime.    [provider]  TRIAMCINOLONE ACETONIDE,NASAL, NA Place 2 sprays into both nostrils every evening.     [provider]  Trolamine Salicylate (ASPERCREME EX) Apply 1 application topically 2 (two) times daily as needed (pain and inflammation).    [provider]  venlafaxine XR (EFFEXOR-XR) 150 MG 24 hr capsule Take 150 mg by mouth daily with breakfast.    [provider]    Family History Family History  Problem Relation Age of Onset  . CAD Maternal Grandfather   . Diabetes Other   . Diabetes Mellitus II Neg Hx   . Colon cancer Neg Hx   . Esophageal cancer Neg Hx   . Inflammatory bowel disease Neg Hx   . Liver disease Neg Hx   .  Pancreatic cancer Neg Hx   . Rectal cancer Neg Hx   . Stomach cancer Neg Hx     Social History Social History   Tobacco Use  . Smoking status: Former Smoker    Quit date: 03/07/2016    Years since quitting: 2.9  . Smokeless tobacco: Never Used  Substance Use Topics  . Alcohol use: Yes    Comment: rare  . Drug use: No     Allergies   Tubersol [tuberculin], Vancomycin, and Doxycycline   Review of Systems Review of Systems  Constitutional:       Per HPI, otherwise negative  HENT:       Per HPI, otherwise negative  Respiratory:       Per HPI, otherwise negative  Cardiovascular:       Per HPI, otherwise negative  Gastrointestinal: Negative for vomiting.  Endocrine:       Negative aside from HPI  Genitourinary:       Neg aside from HPI   Musculoskeletal:       Per HPI, otherwise negative  Skin:       Per HPI  Neurological: Negative for syncope.     Physical Exam Updated Vital Signs BP 130/80   Pulse 89   Temp 99 F (37.2 C)   Resp 16   Ht '6\' 2"'$  (1.88 m)   Wt (!) 181.9 kg   SpO2 96%   BMI 51.49 kg/m   Physical Exam Vitals signs and nursing note reviewed.  Constitutional:      General: He is not in acute distress.    Appearance: He is well-developed. He is obese.  HENT:     Head: Normocephalic  and atraumatic.  Eyes:     Conjunctiva/sclera: Conjunctivae normal.  Cardiovascular:     Rate and Rhythm: Normal rate and regular rhythm.  Pulmonary:     Effort: Pulmonary effort is normal. No respiratory distress.     Breath sounds: No stridor.  Abdominal:     General: There is no distension.  Skin:    General: Skin is warm and dry.     Comments: Both lower extremities and new wound care dressing wrap.  No distal erythema, no or discharge through the dressing.  Neurological:     Mental Status: He is alert and oriented to person, place, and time.      ED Treatments / Results  Labs (all labs ordered are listed, but only abnormal results are displayed) Labs Reviewed  COMPREHENSIVE METABOLIC PANEL - Abnormal; Notable for the following components:      Result Value   Sodium 134 (*)    Potassium 3.4 (*)    Chloride 95 (*)    Glucose, Bld 160 (*)    All other components within normal limits  CBC WITH DIFFERENTIAL/PLATELET - Abnormal; Notable for the following components:   WBC 14.0 (*)    Neutro Abs 11.1 (*)    Abs Immature Granulocytes 0.08 (*)    All other components within normal limits  CBG MONITORING, ED - Abnormal; Notable for the following components:   Glucose-Capillary 152 (*)    All other components within normal limits  MAGNESIUM  BRAIN NATRIURETIC PEPTIDE  PROTIME-INR  TROPONIN I (HIGH SENSITIVITY)  TROPONIN I (HIGH SENSITIVITY)    EKG EKG Interpretation  Date/Time:  Tuesday February 19 2019 17:42:31 EDT Ventricular Rate:  90 PR Interval:    QRS Duration: 123 QT Interval:  383 QTC Calculation: 469 R Axis:   -22 Text Interpretation:  Sinus rhythm Nonspecific intraventricular conduction delay No significant change since last tracing Abnormal ECG Confirmed by Carmin Muskrat [phone]) on 02/19/2019 5:45:37 PM   Radiology Dg Chest Portable 1 View  Result Date: 02/19/2019 CLINICAL DATA:  Chest pain EXAM: PORTABLE CHEST 1 VIEW COMPARISON:  08/17/2018 FINDINGS:  Cardiomegaly. Hyperinflation of the lungs. No confluent opacities or effusions. No acute bony abnormality. IMPRESSION: Cardiomegaly, hyperinflation.  No active disease. Electronically Signed   By: Rolm Baptise M.D.   On: 02/19/2019 18:39    Procedures Procedures (including critical care time)  Medications Ordered in ED Medications  aspirin chewable tablet 324 mg (324 mg Oral Not Given 02/19/19 1813)     Initial Impression / Assessment and Plan / ED Course  I have reviewed the triage vital signs and the nursing notes.  Pertinent labs & imaging results that were available during my care of the patient were reviewed by me and considered in my medical decision making (see chart for details).    Patient had a episode of chest pressure.    9:14 PM  Patient awake, alert, in no distress per He does have some left chest soreness, which she notes has been intermittent. There is been no additional episodes of tachycardia on the monitor, there is no evidence for A. fib, there is some suspicion for this as having occurred earlier in the day, though not sustained. With 2 normal troponin, nonischemic EKG, reassuring labs, no evidence for ACS, no evidence for new pulmonary pathology either, no hypoxia, increased work of breathing. Though the patient does have some intermittent soreness, this is may be musculoskeletal, given the aforementioned reassuring findings. However, given the patient's substantial comorbidities, we discussed close outpatient follow-up with our cardiology clinic versus Admission for monitoring. Patient has a strong preference for discharge, we discussed return precautions, and importance of close follow-up.   Final Clinical Impressions(s) / ED Diagnoses  Palpitations Atypical chest pain   Carmin Muskrat, MD 02/19/19 2117

## 2019-02-19 NOTE — Discharge Instructions (Signed)
As discussed, it is been important you monitor your condition carefully and do not hesitate to return here for concerning changes. You have been provided a referral to our cardiology clinic. You should be contacted with follow-up information, but if they do not call, please use the provided information to ensure appropriate ongoing care.

## 2019-02-19 NOTE — ED Notes (Signed)
Pt states updated GF: [name] concerning transport via [facility]. GF waiting for pt at home

## 2019-02-28 ENCOUNTER — Other Ambulatory Visit: Payer: Self-pay

## 2019-02-28 ENCOUNTER — Ambulatory Visit (HOSPITAL_COMMUNITY)
Admission: RE | Admit: 2019-02-28 | Discharge: 2019-02-28 | Disposition: A | Discharge status: Home / Self Care | Payer: No Typology Code available for payment source | Source: Ambulatory Visit | Attending: Physician Assistant | Admitting: Physician Assistant

## 2019-02-28 ENCOUNTER — Encounter (HOSPITAL_COMMUNITY): Payer: Self-pay | Admitting: Physician Assistant

## 2019-02-28 VITALS — BP 128/72 | HR 91 | Ht 74.0 in | Wt >= 6400 oz

## 2019-02-28 DIAGNOSIS — Z8249 Family history of ischemic heart disease and other diseases of the circulatory system: Secondary | ICD-10-CM | POA: Diagnosis not present

## 2019-02-28 DIAGNOSIS — J449 Chronic obstructive pulmonary disease, unspecified: Secondary | ICD-10-CM | POA: Diagnosis not present

## 2019-02-28 DIAGNOSIS — Z888 Allergy status to other drugs, medicaments and biological substances status: Secondary | ICD-10-CM | POA: Diagnosis not present

## 2019-02-28 DIAGNOSIS — I110 Hypertensive heart disease with heart failure: Secondary | ICD-10-CM | POA: Insufficient documentation

## 2019-02-28 DIAGNOSIS — F4310 Post-traumatic stress disorder, unspecified: Secondary | ICD-10-CM | POA: Insufficient documentation

## 2019-02-28 DIAGNOSIS — F329 Major depressive disorder, single episode, unspecified: Secondary | ICD-10-CM | POA: Insufficient documentation

## 2019-02-28 DIAGNOSIS — Z7984 Long term (current) use of oral hypoglycemic drugs: Secondary | ICD-10-CM | POA: Diagnosis not present

## 2019-02-28 DIAGNOSIS — Z833 Family history of diabetes mellitus: Secondary | ICD-10-CM | POA: Diagnosis not present

## 2019-02-28 DIAGNOSIS — R002 Palpitations: Secondary | ICD-10-CM

## 2019-02-28 DIAGNOSIS — Z87891 Personal history of nicotine dependence: Secondary | ICD-10-CM | POA: Diagnosis not present

## 2019-02-28 DIAGNOSIS — Z6841 Body Mass Index (BMI) 40.0 and over, adult: Secondary | ICD-10-CM | POA: Diagnosis not present

## 2019-02-28 DIAGNOSIS — M109 Gout, unspecified: Secondary | ICD-10-CM | POA: Diagnosis not present

## 2019-02-28 DIAGNOSIS — E119 Type 2 diabetes mellitus without complications: Secondary | ICD-10-CM | POA: Diagnosis not present

## 2019-02-28 DIAGNOSIS — D649 Anemia, unspecified: Secondary | ICD-10-CM | POA: Insufficient documentation

## 2019-02-28 DIAGNOSIS — I2510 Atherosclerotic heart disease of native coronary artery without angina pectoris: Secondary | ICD-10-CM | POA: Insufficient documentation

## 2019-02-28 DIAGNOSIS — M1990 Unspecified osteoarthritis, unspecified site: Secondary | ICD-10-CM | POA: Diagnosis not present

## 2019-02-28 DIAGNOSIS — G4733 Obstructive sleep apnea (adult) (pediatric): Secondary | ICD-10-CM | POA: Insufficient documentation

## 2019-02-28 DIAGNOSIS — Z79899 Other long term (current) drug therapy: Secondary | ICD-10-CM | POA: Insufficient documentation

## 2019-02-28 DIAGNOSIS — Z7982 Long term (current) use of aspirin: Secondary | ICD-10-CM | POA: Diagnosis not present

## 2019-02-28 DIAGNOSIS — I5032 Chronic diastolic (congestive) heart failure: Secondary | ICD-10-CM | POA: Diagnosis not present

## 2019-02-28 DIAGNOSIS — E785 Hyperlipidemia, unspecified: Secondary | ICD-10-CM | POA: Insufficient documentation

## 2019-02-28 DIAGNOSIS — Z881 Allergy status to other antibiotic agents status: Secondary | ICD-10-CM | POA: Diagnosis not present

## 2019-02-28 DIAGNOSIS — I471 Supraventricular tachycardia: Secondary | ICD-10-CM | POA: Insufficient documentation

## 2019-02-28 NOTE — Progress Notes (Signed)
Primary Care Physician: [facility] Primary Cardiologist: Dr Radford Pax Primary Electrophysiologist: none Referring Physician: [facility]   Lawrence [name] is a 63 y.o. male with a history of hx of nonobstructive CAD by cath 01/2018, HTN, HLD, DM, chronic diastolic CHF, OSA, PTSD, super morbid obesity (BMI >50), gout, depression, anxiety, DNR who presents for consultation in the [facility]. He reports that he has had brief episodes of heart racing, SOB, and sharp left-sided chest pain which last about 5-10 minutes and can occur several times per week. There are no triggers that the patient can identify. He was recently seen at the ER after one of these episodes but was in NSR on presentation. He has no documented history of afib. He did have a very short burst of SVT noted on telemetry during his hospitalization in January of this year but it was too brief to characterize. He denies alcohol use and reports compliance with his BiPap.  Today, he denies symptoms of orthopnea, PND, lower extremity edema, dizziness, presyncope, syncope, snoring, daytime somnolence, bleeding, or neurologic sequela. The patient is tolerating medications without difficulties and is otherwise without complaint today.    Atrial Fibrillation Risk Factors:  he does have symptoms or diagnosis of sleep apnea. he is compliant with BiPAP therapy. he does not have a history of rheumatic fever. he does not have a history of alcohol use. The patient does not have a history of early familial atrial fibrillation or other arrhythmias.  he has a BMI of Body mass index is 51.49 kg/m.[name] Filed Weights   02/28/19 0948  Weight: (!) 181.9 kg    Family History  Problem Relation Age of Onset  . CAD Maternal Grandfather   . Diabetes Other   . Diabetes Mellitus II Neg Hx   . Colon cancer Neg Hx   . Esophageal cancer Neg Hx   . Inflammatory bowel disease Neg Hx   . Liver disease Neg Hx   .  Pancreatic cancer Neg Hx   . Rectal cancer Neg Hx   . Stomach cancer Neg Hx      Atrial Fibrillation Management history:  Previous antiarrhythmic drugs: none Previous cardioversions: none Previous ablations: none CHADS2VASC score: 3 Anticoagulation history: none (ASA)   Past Medical History:  Diagnosis Date  . Adenomatous colon polyp 2007  . Anemia   . Anxiety   . Arthritis   . Asthma   . Chronic cough   . Chronic diastolic CHF (congestive heart failure) ([place])   . COPD (chronic obstructive pulmonary disease) ([place])   . Depression   . History of cardiac cath    a.  01/2018 with angiographically minimal coronary disease with  up to 40% prox LAD and 20% mid LCx and OM2 with normal LVF and normal LVEDP..  . Hypertension   . Morbid obesity ([place])   . New onset type 2 diabetes mellitus ([place]) 11/08/2017  . PTSD (post-traumatic stress disorder)   . Sleep apnea    CPAP   Past Surgical History:  Procedure Laterality Date  . JOINT REPLACEMENT     left knee replacement x 2  . KNEE ARTHROSCOPY Bilateral   . LEFT HEART CATH AND CORONARY ANGIOGRAPHY N/A 01/17/2018   Procedure: LEFT HEART CATH AND CORONARY ANGIOGRAPHY;  Surgeon: Leonie Man, MD;  Location: [facility];  Service: Cardiovascular;  Laterality: N/A;  . UMBILICAL HERNIA REPAIR      Current Outpatient Medications  Medication Sig Dispense Refill  .  acetaminophen (TYLENOL) 500 MG tablet Take 500 mg by mouth 4 (four) times daily as needed for mild pain.     [name] albuterol (PROVENTIL HFA;VENTOLIN HFA) 108 (90 Base) MCG/ACT inhaler Inhale 2 puffs into the lungs every 6 (six) hours as needed for wheezing or shortness of breath.    . allopurinol (ZYLOPRIM) 100 MG tablet Take 1 tablet (100 mg total) by mouth daily. 30 tablet 0  . ARIPiprazole (ABILIFY) 5 MG tablet Take 5 mg by mouth at bedtime.     [name] aspirin EC 81 MG EC tablet Take 1 tablet (81 mg total) by mouth daily.    [name] atorvastatin (LIPITOR) 40 MG tablet Take 1 tablet  (40 mg total) by mouth daily at 6 PM. 30 tablet 0  . blood glucose meter kit and supplies Dispense based on patient and insurance preference. Use up to four times daily as directed. (FOR ICD-10 E10.9, E11.9). 1 each 0  . cetirizine (ZYRTEC) 10 MG tablet Take 10 mg by mouth daily.    . ferrous sulfate 324 (65 Fe) MG TBEC Take by mouth.    . fluticasone (FLONASE) 50 MCG/ACT nasal spray Place 1 spray into both nostrils daily.     . furosemide (LASIX) 80 MG tablet Take 80 mg by mouth 2 (two) times daily.    [name] lisinopril (PRINIVIL,ZESTRIL) 10 MG tablet Take 10 mg by mouth daily.    [name] loperamide (IMODIUM) 2 MG capsule Take 2 mg by mouth as needed for diarrhea or loose stools.    . metFORMIN (GLUCOPHAGE) 500 MG tablet Take 1 tablet (500 mg total) by mouth 2 (two) times daily with a meal. 60 tablet 0  . prazosin (MINIPRESS) 2 MG capsule Take 4 mg by mouth at bedtime.    . pregabalin (LYRICA) 75 MG capsule Take 75 mg by mouth 2 (two) times daily.    . silver sulfADIAZINE (SILVADENE) 1 % cream Apply 1 application topically daily. Una boot (both legs)    . temazepam (RESTORIL) 15 MG capsule Take 15 mg by mouth at bedtime.     . traZODone (DESYREL) 100 MG tablet Take 200 mg by mouth at bedtime.    Loura Pardon Salicylate (ASPERCREME EX) Apply 1 application topically 2 (two) times daily as needed (pain and inflammation).    . venlafaxine XR (EFFEXOR-XR) 150 MG 24 hr capsule Take 150 mg by mouth daily with breakfast.     No current facility-administered medications for this encounter.     Allergies  Allergen Reactions  . Tubersol [Tuberculin] Other (See Comments)    Reaction unknown  . Vancomycin Other (See Comments)    Red Man Syndrome  . Doxycycline Rash    Social History   Socioeconomic History  . Marital status: Single    Spouse name: Not on file  . Number of children: 1  . Years of education: Not on file  . Highest education level: Not on file  Occupational History  . Occupation: retired   Scientific laboratory technician  . Financial resource strain: Not on file  . Food insecurity    Worry: Not on file    Inability: Not on file  . Transportation needs    Medical: Not on file    Non-medical: Not on file  Tobacco Use  . Smoking status: Former Smoker    Quit date: 03/07/2016    Years since quitting: 2.9  . Smokeless tobacco: Never Used  Substance and Sexual Activity  . Alcohol use: Yes    Comment:  rare  . Drug use: No  . Sexual activity: Not on file  Lifestyle  . Physical activity    Days per week: Not on file    Minutes per session: Not on file  . Stress: Not on file  Relationships  . Social Herbalist on phone: Not on file    Gets together: Not on file    Attends religious service: Not on file    Active member of club or organization: Not on file    Attends meetings of clubs or organizations: Not on file    Relationship status: Not on file  . Intimate partner violence    Fear of current or ex partner: Not on file    Emotionally abused: Not on file    Physically abused: Not on file    Forced sexual activity: Not on file  Other Topics Concern  . Not on file  Social History Narrative  . Not on file     ROS- All systems are reviewed and negative except as per the HPI above.  Physical Exam: Vitals:   02/28/19 0948  BP: 128/72  Pulse: 91  Weight: (!) 181.9 kg  Height: '6\' 2"'$  (1.88 m)    GEN- The patient is well appearing morbidly obese male, alert and oriented x 3 today.   Head- normocephalic, atraumatic Eyes-  Sclera clear, conjunctiva pink Ears- hearing intact Oropharynx- clear Neck- supple  Lungs- Clear to ausculation bilaterally, normal work of breathing Heart- Regular rate and rhythm, no murmurs, rubs or gallops  GI- soft, NT, ND, + BS Extremities- no clubbing, cyanosis. 1+ bilateral edema MS- no significant deformity or atrophy Skin- no rash or lesion Psych- euthymic mood, full affect Neuro- strength and sensation are intact  Wt Readings from  Last 3 Encounters:  02/28/19 (!) 181.9 kg  02/19/19 (!) 181.9 kg  08/19/18 (!) 177.9 kg    EKG today demonstrates SR HR 91, PR 194, QRS 108, QTc 455  Echo 01/15/18 demonstrated  - Left ventricle: The cavity size was normal. Systolic function was   normal. The estimated ejection fraction was in the range of 60%   to 65%. Wall motion was normal; there were no regional wall   motion abnormalities. Features are consistent with a pseudonormal   left ventricular filling pattern, with concomitant abnormal   relaxation and increased filling pressure (grade 2 diastolic   dysfunction). Doppler parameters are consistent with elevated   ventricular end-diastolic filling pressure. - Aortic valve: Transvalvular velocity was minimally increased.   There was no stenosis. There was no regurgitation. - Aortic root: The aortic root was normal in size. - Mitral valve: There was no regurgitation. - Right ventricle: Systolic function was normal. - Tricuspid valve: There was no regurgitation. - Pulmonic valve: There was no regurgitation. - Pulmonary arteries: Systolic pressure could not be accurately   estimated. - Inferior vena cava: The vessel was normal in size. - Pericardium, extracardiac: There was no pericardial effusion.  Epic records are reviewed at length today  Assessment and Plan:  1. SVT Patient has not had documented atrial fibrillation. However, his home health nurse did observe elevated heart rates, up to 140 on two separate occassions.  There was brief SVT noted on the monitor during his hospitalization in 08/2018 but this was too brief to characterize.  General education about afib discussed and questions answered. Will order 7 day Zio patch to characterize palpitations before initiating medications.  He certainly has risk factors for afib.  This patients CHA2DS2-VASc Score and unadjusted Ischemic Stroke Rate (% per year) is equal to 3.2 % stroke rate/year from a score of 3  Above  score calculated as 1 point each if present [CHF, HTN, DM, Vascular=MI/PAD/Aortic Plaque, Age if 65-74, or Male] Above score calculated as 2 points each if present [Age > 75, or Stroke/TIA/TE]   2. Morbid Obesity Body mass index is 51.49 kg/m. Lifestyle modification was discussed at length including regular exercise and weight reduction.  3. Obstructive sleep apnea The importance of adequate treatment of sleep apnea was discussed today in order to improve our ability to maintain sinus rhythm long term. Encouraged compliance with BiPap.  4. CAD Non obstructive by [facility] 01/17/18. LAD 40%, LCx 20%, 2nd OM 20%. No anginal symptoms. Continue present therapy and risk factor modification.  5. HTN Stable, no changes today.   Follow up pending results of Zio patch.   [facility] [address] [place], [place] [address] [phone] 02/28/2019 10:30 AM

## 2019-03-07 ENCOUNTER — Telehealth (HOSPITAL_COMMUNITY): Payer: Self-pay | Admitting: *Deleted

## 2019-03-07 ENCOUNTER — Emergency Department (HOSPITAL_COMMUNITY): Payer: No Typology Code available for payment source

## 2019-03-07 ENCOUNTER — Encounter (HOSPITAL_COMMUNITY): Payer: Self-pay

## 2019-03-07 ENCOUNTER — Inpatient Hospital Stay (HOSPITAL_COMMUNITY)
Admission: EM | Admit: 2019-03-07 | Discharge: 2019-03-09 | DRG: 309 | Disposition: A | Discharge status: Home Health | Payer: No Typology Code available for payment source | Attending: Internal Medicine | Admitting: Internal Medicine

## 2019-03-07 ENCOUNTER — Other Ambulatory Visit: Payer: Self-pay

## 2019-03-07 DIAGNOSIS — I872 Venous insufficiency (chronic) (peripheral): Secondary | ICD-10-CM | POA: Diagnosis present

## 2019-03-07 DIAGNOSIS — J449 Chronic obstructive pulmonary disease, unspecified: Secondary | ICD-10-CM | POA: Diagnosis present

## 2019-03-07 DIAGNOSIS — F4310 Post-traumatic stress disorder, unspecified: Secondary | ICD-10-CM | POA: Diagnosis present

## 2019-03-07 DIAGNOSIS — Z6841 Body Mass Index (BMI) 40.0 and over, adult: Secondary | ICD-10-CM

## 2019-03-07 DIAGNOSIS — Z7951 Long term (current) use of inhaled steroids: Secondary | ICD-10-CM

## 2019-03-07 DIAGNOSIS — I2510 Atherosclerotic heart disease of native coronary artery without angina pectoris: Secondary | ICD-10-CM | POA: Diagnosis present

## 2019-03-07 DIAGNOSIS — L03116 Cellulitis of left lower limb: Secondary | ICD-10-CM | POA: Diagnosis present

## 2019-03-07 DIAGNOSIS — R000 Tachycardia, unspecified: Secondary | ICD-10-CM

## 2019-03-07 DIAGNOSIS — E7800 Pure hypercholesterolemia, unspecified: Secondary | ICD-10-CM | POA: Diagnosis present

## 2019-03-07 DIAGNOSIS — Z7984 Long term (current) use of oral hypoglycemic drugs: Secondary | ICD-10-CM

## 2019-03-07 DIAGNOSIS — I878 Other specified disorders of veins: Secondary | ICD-10-CM

## 2019-03-07 DIAGNOSIS — I110 Hypertensive heart disease with heart failure: Secondary | ICD-10-CM | POA: Diagnosis present

## 2019-03-07 DIAGNOSIS — Z66 Do not resuscitate: Secondary | ICD-10-CM | POA: Diagnosis present

## 2019-03-07 DIAGNOSIS — Z87891 Personal history of nicotine dependence: Secondary | ICD-10-CM

## 2019-03-07 DIAGNOSIS — Z8249 Family history of ischemic heart disease and other diseases of the circulatory system: Secondary | ICD-10-CM

## 2019-03-07 DIAGNOSIS — I471 Supraventricular tachycardia, unspecified: Secondary | ICD-10-CM

## 2019-03-07 DIAGNOSIS — I4581 Long QT syndrome: Secondary | ICD-10-CM | POA: Diagnosis present

## 2019-03-07 DIAGNOSIS — M19012 Primary osteoarthritis, left shoulder: Secondary | ICD-10-CM | POA: Diagnosis present

## 2019-03-07 DIAGNOSIS — R9431 Abnormal electrocardiogram [ECG] [EKG]: Secondary | ICD-10-CM

## 2019-03-07 DIAGNOSIS — L03115 Cellulitis of right lower limb: Secondary | ICD-10-CM | POA: Diagnosis present

## 2019-03-07 DIAGNOSIS — I4519 Other right bundle-branch block: Secondary | ICD-10-CM | POA: Diagnosis present

## 2019-03-07 DIAGNOSIS — M109 Gout, unspecified: Secondary | ICD-10-CM | POA: Diagnosis present

## 2019-03-07 DIAGNOSIS — R079 Chest pain, unspecified: Secondary | ICD-10-CM | POA: Diagnosis present

## 2019-03-07 DIAGNOSIS — Z20828 Contact with and (suspected) exposure to other viral communicable diseases: Secondary | ICD-10-CM | POA: Diagnosis present

## 2019-03-07 DIAGNOSIS — I5032 Chronic diastolic (congestive) heart failure: Secondary | ICD-10-CM | POA: Diagnosis present

## 2019-03-07 DIAGNOSIS — R0789 Other chest pain: Secondary | ICD-10-CM | POA: Diagnosis not present

## 2019-03-07 DIAGNOSIS — Z833 Family history of diabetes mellitus: Secondary | ICD-10-CM

## 2019-03-07 DIAGNOSIS — G4733 Obstructive sleep apnea (adult) (pediatric): Secondary | ICD-10-CM | POA: Diagnosis present

## 2019-03-07 DIAGNOSIS — Z887 Allergy status to serum and vaccine status: Secondary | ICD-10-CM

## 2019-03-07 DIAGNOSIS — Z7982 Long term (current) use of aspirin: Secondary | ICD-10-CM

## 2019-03-07 DIAGNOSIS — E1142 Type 2 diabetes mellitus with diabetic polyneuropathy: Secondary | ICD-10-CM | POA: Diagnosis present

## 2019-03-07 DIAGNOSIS — F419 Anxiety disorder, unspecified: Secondary | ICD-10-CM | POA: Diagnosis present

## 2019-03-07 DIAGNOSIS — F329 Major depressive disorder, single episode, unspecified: Secondary | ICD-10-CM | POA: Diagnosis present

## 2019-03-07 DIAGNOSIS — Z79899 Other long term (current) drug therapy: Secondary | ICD-10-CM

## 2019-03-07 DIAGNOSIS — Z881 Allergy status to other antibiotic agents status: Secondary | ICD-10-CM

## 2019-03-07 HISTORY — DX: Abnormal electrocardiogram (ECG) (EKG): R94.31

## 2019-03-07 HISTORY — DX: Tachycardia, unspecified: R00.0

## 2019-03-07 HISTORY — DX: Other specified disorders of veins: I87.8

## 2019-03-07 LAB — BASIC METABOLIC PANEL
Anion gap: 13 (ref 5–15)
BUN: 27 mg/dL — ABNORMAL HIGH (ref 8–23)
CO2: 25 mmol/L (ref 22–32)
Calcium: 8.9 mg/dL (ref 8.9–10.3)
Chloride: 95 mmol/L — ABNORMAL LOW (ref 98–111)
Creatinine, Ser: 1.05 mg/dL (ref 0.61–1.24)
GFR calc Af Amer: >60 mL/min (ref >60)
GFR calc non Af Amer: >60 mL/min (ref >60)
Glucose, Bld: 176 mg/dL — ABNORMAL HIGH (ref 70–99)
Potassium: 3.9 mmol/L (ref 3.5–5.1)
Sodium: 133 mmol/L — ABNORMAL LOW (ref 135–145)

## 2019-03-07 LAB — CBC
HCT: 39.4 % (ref 39.0–52.0)
Hemoglobin: 12.9 g/dL — ABNORMAL LOW (ref 13.0–17.0)
MCH: 29.1 pg (ref 26.0–34.0)
MCHC: 32.7 g/dL (ref 30.0–36.0)
MCV: 88.9 fL (ref 80.0–100.0)
Platelets: 295 10*3/uL (ref 150–400)
RBC: 4.43 MIL/uL (ref 4.22–5.81)
RDW: 14 % (ref 11.5–15.5)
WBC: 15.3 10*3/uL — ABNORMAL HIGH (ref 4.0–10.5)
nRBC: 0 % (ref 0.0–0.2)

## 2019-03-07 LAB — TROPONIN I (HIGH SENSITIVITY)
Troponin I (High Sensitivity): 6 ng/L (ref <18)
Troponin I (High Sensitivity): 6 ng/L (ref <18)

## 2019-03-07 LAB — SARS CORONAVIRUS 2 BY RT PCR (HOSPITAL ORDER, PERFORMED IN ~~LOC~~ HOSPITAL LAB): SARS Coronavirus 2: NEGATIVE

## 2019-03-07 LAB — CBG MONITORING, ED: Glucose-Capillary: 139 mg/dL — ABNORMAL HIGH (ref 70–99)

## 2019-03-07 MED ORDER — MORPHINE SULFATE (PF) 4 MG/ML IV SOLN
4.0000 mg | Freq: Once | INTRAVENOUS | Status: AC
  Administered 2019-03-07: 4 mg via INTRAVENOUS
  Filled 2019-03-07: qty 1

## 2019-03-07 MED ORDER — INSULIN ASPART 100 UNIT/ML ~~LOC~~ SOLN: 0.0000 [IU] | Freq: Every day | SUBCUTANEOUS | Status: DC

## 2019-03-07 MED ORDER — ENOXAPARIN SODIUM 100 MG/ML ~~LOC~~ SOLN
90.0000 mg | Freq: Every day | SUBCUTANEOUS | Status: DC
  Administered 2019-03-08 – 2019-03-09 (×2): 90 mg via SUBCUTANEOUS
  Filled 2019-03-07 (×2): qty 0.9

## 2019-03-07 MED ORDER — MORPHINE SULFATE (PF) 2 MG/ML IV SOLN
0.5000 mg | INTRAVENOUS | Status: DC | PRN
  Administered 2019-03-08: 0.5 mg via INTRAVENOUS
  Filled 2019-03-07: qty 1

## 2019-03-07 MED ORDER — TRAZODONE HCL 50 MG PO TABS
200.0000 mg | ORAL_TABLET | Freq: Every day | ORAL | Status: DC
  Administered 2019-03-08 (×2): 200 mg via ORAL
  Filled 2019-03-07 (×2): qty 4

## 2019-03-07 MED ORDER — TEMAZEPAM 15 MG PO CAPS
15.0000 mg | ORAL_CAPSULE | Freq: Every day | ORAL | Status: DC
  Administered 2019-03-08 (×2): 15 mg via ORAL
  Filled 2019-03-07 (×2): qty 1

## 2019-03-07 MED ORDER — SODIUM CHLORIDE 0.9 % IV BOLUS
500.0000 mL | Freq: Once | INTRAVENOUS | Status: AC
  Administered 2019-03-07: 500 mL via INTRAVENOUS

## 2019-03-07 MED ORDER — DILTIAZEM HCL 25 MG/5ML IV SOLN
10.0000 mg | INTRAVENOUS | Status: DC | PRN
  Filled 2019-03-07: qty 5

## 2019-03-07 MED ORDER — INSULIN ASPART 100 UNIT/ML ~~LOC~~ SOLN
0.0000 [IU] | Freq: Three times a day (TID) | SUBCUTANEOUS | Status: DC
  Administered 2019-03-08: 2 [IU] via SUBCUTANEOUS
  Administered 2019-03-08: 3 [IU] via SUBCUTANEOUS
  Administered 2019-03-08 – 2019-03-09 (×2): 2 [IU] via SUBCUTANEOUS
  Administered 2019-03-09: 3 [IU] via SUBCUTANEOUS

## 2019-03-07 MED ORDER — POTASSIUM CHLORIDE CRYS ER 20 MEQ PO TBCR
20.0000 meq | EXTENDED_RELEASE_TABLET | Freq: Once | ORAL | Status: AC
  Administered 2019-03-07: 20 meq via ORAL
  Filled 2019-03-07: qty 1

## 2019-03-07 MED ORDER — PREGABALIN 75 MG PO CAPS
75.0000 mg | ORAL_CAPSULE | Freq: Two times a day (BID) | ORAL | Status: DC
  Administered 2019-03-08 – 2019-03-09 (×4): 75 mg via ORAL
  Filled 2019-03-07 (×4): qty 1

## 2019-03-07 MED ORDER — FERROUS SULFATE 325 (65 FE) MG PO TABS
325.0000 mg | ORAL_TABLET | Freq: Every day | ORAL | Status: DC
  Administered 2019-03-08 – 2019-03-09 (×2): 325 mg via ORAL
  Filled 2019-03-07 (×2): qty 1

## 2019-03-07 MED ORDER — ASPIRIN EC 81 MG PO TBEC
81.0000 mg | DELAYED_RELEASE_TABLET | Freq: Every day | ORAL | Status: DC
  Administered 2019-03-08 – 2019-03-09 (×2): 81 mg via ORAL
  Filled 2019-03-07 (×2): qty 1

## 2019-03-07 MED ORDER — ATORVASTATIN CALCIUM 40 MG PO TABS
40.0000 mg | ORAL_TABLET | Freq: Every day | ORAL | Status: DC
  Administered 2019-03-08: 40 mg via ORAL
  Filled 2019-03-07: qty 1

## 2019-03-07 MED ORDER — LORATADINE 10 MG PO TABS
10.0000 mg | ORAL_TABLET | Freq: Every day | ORAL | Status: DC
  Administered 2019-03-08 – 2019-03-09 (×2): 10 mg via ORAL
  Filled 2019-03-07 (×2): qty 1

## 2019-03-07 MED ORDER — ONDANSETRON HCL 4 MG/2ML IJ SOLN
4.0000 mg | Freq: Once | INTRAMUSCULAR | Status: AC
  Administered 2019-03-07: 4 mg via INTRAVENOUS
  Filled 2019-03-07: qty 2

## 2019-03-07 MED ORDER — CEFAZOLIN SODIUM-DEXTROSE 2-4 GM/100ML-% IV SOLN
2.0000 g | Freq: Three times a day (TID) | INTRAVENOUS | Status: DC
  Administered 2019-03-07 – 2019-03-09 (×5): 2 g via INTRAVENOUS
  Filled 2019-03-07 (×6): qty 100

## 2019-03-07 MED ORDER — VENLAFAXINE HCL ER 150 MG PO CP24
150.0000 mg | ORAL_CAPSULE | Freq: Every day | ORAL | Status: DC
  Administered 2019-03-08 – 2019-03-09 (×2): 150 mg via ORAL
  Filled 2019-03-07 (×2): qty 1

## 2019-03-07 MED ORDER — ARIPIPRAZOLE 5 MG PO TABS
5.0000 mg | ORAL_TABLET | Freq: Every day | ORAL | Status: DC
  Administered 2019-03-08 (×2): 5 mg via ORAL
  Filled 2019-03-07 (×3): qty 1

## 2019-03-07 MED ORDER — FLUTICASONE PROPIONATE 50 MCG/ACT NA SUSP
1.0000 | Freq: Every day | NASAL | Status: DC
  Administered 2019-03-08 – 2019-03-09 (×2): 1 via NASAL
  Filled 2019-03-07 (×2): qty 16

## 2019-03-07 MED ORDER — ALLOPURINOL 100 MG PO TABS
100.0000 mg | ORAL_TABLET | Freq: Every day | ORAL | Status: DC
  Administered 2019-03-08 – 2019-03-09 (×2): 100 mg via ORAL
  Filled 2019-03-07 (×2): qty 1

## 2019-03-07 NOTE — ED Notes (Signed)
Unsuccessful blood draw. Tech Bre informed

## 2019-03-07 NOTE — ED Triage Notes (Addendum)
Pt via [facility] chest pain, SOB, Dizziness and tachycardiax 3-4 wks. Today while walking felt like heart was "fluttering, dizzy, SOB, pain up left side of neck and left arm and it lasted over an hour after sitting down. Per EMS pt hr 140s. EMS gave ASA 324mg 

## 2019-03-07 NOTE — H&P (Signed)
History and Physical    Doss Cybulski HKV:[id number] DOB: [birth year] DOA: 03/07/2019  PCP: [facility] Patient coming from: Home  Chief Complaint: Chest pain, heart palpitations  HPI: Lawrence [name] is a 63 y.o. male with medical history significant of anemia, anxiety, asthma, chronic diastolic congestive heart failure, COPD, depression, CAD, hypertension, type 2 diabetes, sleep apnea presenting to the hospital for evaluation of chest pain and heart palpitations.  Patient reports 3-week history of intermittent heart palpitations and chest pain.  States this can happen anytime even if he is resting.  Each episode of heart palpitation lasts about 15 minutes.  He is also having intermittent episodes of chest pain which are sometimes related to the heart palpitations.  Chest pain is usually left-sided, pressure-like, and occurs anytime/even at rest.  Today he experienced heart palpitations while walking within his house and when he checked his heart rate it was in the 150s.  He also had left-sided chest pain at that time which was sharp and radiated to his neck and left arm.  He felt short of breath.  No diaphoresis or nausea.  He believes today his palpitations lasted about an hour.  His chest pain improved significantly after he received morphine in the ED.  States he was recently seen by a cardiologist for his symptoms and a heart monitor was placed.  He removed his heart monitor earlier today and send it off in the mail before this last episode of palpitations occurred.  Denies having any fevers or chills.  Denies any vomiting, abdominal pain, dysuria, or urinary frequency/urgency.  States his legs are chronically swollen and red.  He thinks the redness has been worse lately.  No other complaints.  Patient was seen in the [facility] on 7/23 for evaluation of episodes of heart racing, shortness of breath, and sharp left-sided chest pain.  He had brief SVT noted on the monitor during his  hospitalization in January 2020 but it was too brief to characterize.  A 7-day ZIO patch to characterize palpitations was ordered.   ED Course: Per EMS, heart rate in the 140s.  EMS gave aspirin 324 mg.  Afebrile.  Blood pressure intermittently soft.  High-sensitivity troponin checked twice negative.  EKG not suggestive of ACS, showing sinus tachycardia (HR 135), incomplete RBBB, and QTC prolongation (504).   White count 15.3.  Hemoglobin 12.9, ranging between 12.8-13.4 in the past 6 months. Chest x-ray showing mild cardiomegaly and no acute cardiopulmonary disease.  Patient received morphine, Zofran, and a 500 cc fluid bolus in the ED. Per ED provider conversation, case was discussed with on-call cardiology fellow, no recommendations at this time.  Review of Systems:  All systems reviewed and apart from history of presenting illness, are negative.  Past Medical History:  Diagnosis Date  . Adenomatous colon polyp 2007  . Anemia   . Anxiety   . Arthritis   . Asthma   . Chronic cough   . Chronic diastolic CHF (congestive heart failure) ([place])   . COPD (chronic obstructive pulmonary disease) ([place])   . Depression   . History of cardiac cath    a.  01/2018 with angiographically minimal coronary disease with  up to 40% prox LAD and 20% mid LCx and OM2 with normal LVF and normal LVEDP..  . Hypertension   . Morbid obesity ([place])   . New onset type 2 diabetes mellitus ([place]) 11/08/2017  . PTSD (post-traumatic stress disorder)   . Sleep apnea    CPAP  Past Surgical History:  Procedure Laterality Date  . JOINT REPLACEMENT     left knee replacement x 2  . KNEE ARTHROSCOPY Bilateral   . LEFT HEART CATH AND CORONARY ANGIOGRAPHY N/A 01/17/2018   Procedure: LEFT HEART CATH AND CORONARY ANGIOGRAPHY;  Surgeon: Leonie Man, MD;  Location: [facility];  Service: Cardiovascular;  Laterality: N/A;  . UMBILICAL HERNIA REPAIR       reports that he quit smoking about 3 years ago. He has never used  smokeless tobacco. He reports current alcohol use. He reports that he does not use drugs.  Allergies  Allergen Reactions  . Tubersol [Tuberculin] Other (See Comments)    Reaction unknown  . Vancomycin Other (See Comments)    Red Man Syndrome  . Doxycycline Rash    Family History  Problem Relation Age of Onset  . CAD Maternal Grandfather   . Diabetes Other   . Diabetes Mellitus II Neg Hx   . Colon cancer Neg Hx   . Esophageal cancer Neg Hx   . Inflammatory bowel disease Neg Hx   . Liver disease Neg Hx   . Pancreatic cancer Neg Hx   . Rectal cancer Neg Hx   . Stomach cancer Neg Hx     Prior to Admission medications   Medication Sig Start Date End Date Taking? Authorizing Provider  acetaminophen (TYLENOL) 500 MG tablet Take 500 mg by mouth 4 (four) times daily as needed for mild pain.     [provider]  albuterol (PROVENTIL HFA;VENTOLIN HFA) 108 (90 Base) MCG/ACT inhaler Inhale 2 puffs into the lungs every 6 (six) hours as needed for wheezing or shortness of breath.    [provider]  allopurinol (ZYLOPRIM) 100 MG tablet Take 1 tablet (100 mg total) by mouth daily. 01/18/18   Domenic Polite, MD  ARIPiprazole (ABILIFY) 5 MG tablet Take 5 mg by mouth at bedtime.     [provider]  aspirin EC 81 MG EC tablet Take 1 tablet (81 mg total) by mouth daily. 01/19/18   Domenic Polite, MD  atorvastatin (LIPITOR) 40 MG tablet Take 1 tablet (40 mg total) by mouth daily at 6 PM. 01/18/18   Domenic Polite, MD  blood glucose meter kit and supplies Dispense based on patient and insurance preference. Use up to four times daily as directed. (FOR ICD-10 E10.9, E11.9). 11/09/17   Eugenie Filler, MD  cetirizine (ZYRTEC) 10 MG tablet Take 10 mg by mouth daily.    [provider]  ferrous sulfate 324 (65 Fe) MG TBEC Take by mouth.    [provider]  fluticasone (FLONASE) 50 MCG/ACT nasal spray Place 1 spray into both nostrils daily.     [provider]  furosemide (LASIX) 80 MG tablet Take 80 mg by mouth 2 (two) times daily.    [provider]  lisinopril (PRINIVIL,ZESTRIL) 10 MG tablet Take 10 mg by mouth daily.    [provider]  loperamide (IMODIUM) 2 MG capsule Take 2 mg by mouth as needed for diarrhea or loose stools.    [provider]  metFORMIN (GLUCOPHAGE) 500 MG tablet Take 1 tablet (500 mg total) by mouth 2 (two) times daily with a meal. 11/09/17 02/28/19  Eugenie Filler, MD  prazosin (MINIPRESS) 2 MG capsule Take 4 mg by mouth at bedtime.    [provider]  pregabalin (LYRICA) 75 MG capsule Take 75 mg by mouth 2 (two) times daily.  [provider]  silver sulfADIAZINE (SILVADENE) 1 % cream Apply 1 application topically daily. Una boot (both legs)    [provider]  temazepam (RESTORIL) 15 MG capsule Take 15 mg by mouth at bedtime.     [provider]  traZODone (DESYREL) 100 MG tablet Take 200 mg by mouth at bedtime.    [provider]  Trolamine Salicylate (ASPERCREME EX) Apply 1 application topically 2 (two) times daily as needed (pain and inflammation).    [provider]  venlafaxine XR (EFFEXOR-XR) 150 MG 24 hr capsule Take 150 mg by mouth daily with breakfast.    [provider]    Physical Exam: Vitals:   03/07/19 2045 03/07/19 2100 03/07/19 2115 03/07/19 2130  BP: 113/68 (!) 132/96 (!) 123/45 (!) 124/58  Pulse: 90 92 91 82  Resp: 15 (!) '27 16 15  '$ Temp:      TempSrc:      SpO2: 95% 96% 94% 96%  Weight:      Height:         Physical Exam  Constitutional: He is oriented to person, place, and time. He appears well-developed and well-nourished. No distress.  Morbidly obese  HENT:  Head: Normocephalic.  Mouth/Throat: Oropharynx is clear and moist.  Eyes: Right eye exhibits no discharge. Left eye exhibits no discharge.  Neck: Neck supple.  Cardiovascular: Normal rate, regular rhythm and intact distal  pulses.  Pulmonary/Chest: Effort normal and breath sounds normal. No respiratory distress. He has no wheezes. He has no rales.  Abdominal: Soft. Bowel sounds are normal. There is no abdominal tenderness. There is no guarding.  Musculoskeletal:        General: Edema present.     Comments: Pitting edema of bilateral lower extremities (chronic per patient).  Neurological: He is alert and oriented to person, place, and time.  Skin: Skin is warm and dry. He is not diaphoretic.  Bilateral lower extremities appear erythematous and warm to touch from the mid shin level down to the ankle, mostly the anterior aspect.     Labs on Admission: I have personally reviewed following labs and imaging studies  CBC: Recent Labs  Lab 03/07/19 1541  WBC 15.3*  HGB 12.9*  HCT 39.4  MCV 88.9  PLT 349   Basic Metabolic Panel: Recent Labs  Lab 03/07/19 1541  NA 133*  K 3.9  CL 95*  CO2 25  GLUCOSE 176*  BUN 27*  CREATININE 1.05  CALCIUM 8.9   GFR: Estimated Creatinine Clearance: 125.9 mL/min (by C-G formula based on SCr of 1.05 mg/dL). Liver Function Tests: No results for input(s): AST, ALT, ALKPHOS, BILITOT, PROT, ALBUMIN in the last 168 hours. No results for input(s): LIPASE, AMYLASE in the last 168 hours. No results for input(s): AMMONIA in the last 168 hours. Coagulation Profile: No results for input(s): INR, PROTIME in the last 168 hours. Cardiac Enzymes: No results for input(s): CKTOTAL, CKMB, CKMBINDEX, TROPONINI in the last 168 hours. BNP (last 3 results) No results for input(s): PROBNP in the last 8760 hours. HbA1C: No results for input(s): HGBA1C in the last 72 hours. CBG: Recent Labs  Lab 03/07/19 2139  GLUCAP 139*   Lipid Profile: No results for input(s): CHOL, HDL, LDLCALC, TRIG, CHOLHDL, LDLDIRECT in the last 72 hours. Thyroid Function Tests: No results for input(s): TSH, T4TOTAL, FREET4, T3FREE, THYROIDAB in the last 72 hours. Anemia Panel: No results for input(s):  VITAMINB12, FOLATE, FERRITIN, TIBC, IRON, RETICCTPCT in the last 72 hours. Urine analysis:  Component Value Date/Time   COLORURINE RED (A) 02/06/2017 1215   APPEARANCEUR CLOUDY (A) 02/06/2017 1215   LABSPEC  02/06/2017 1215    TEST NOT REPORTED DUE TO COLOR INTERFERENCE OF URINE PIGMENT   PHURINE  02/06/2017 1215    TEST NOT REPORTED DUE TO COLOR INTERFERENCE OF URINE PIGMENT   GLUCOSEU (A) 02/06/2017 1215    TEST NOT REPORTED DUE TO COLOR INTERFERENCE OF URINE PIGMENT   HGBUR (A) 02/06/2017 1215    TEST NOT REPORTED DUE TO COLOR INTERFERENCE OF URINE PIGMENT   BILIRUBINUR (A) 02/06/2017 1215    TEST NOT REPORTED DUE TO COLOR INTERFERENCE OF URINE PIGMENT   KETONESUR (A) 02/06/2017 1215    TEST NOT REPORTED DUE TO COLOR INTERFERENCE OF URINE PIGMENT   PROTEINUR (A) 02/06/2017 1215    TEST NOT REPORTED DUE TO COLOR INTERFERENCE OF URINE PIGMENT   NITRITE (A) 02/06/2017 1215    TEST NOT REPORTED DUE TO COLOR INTERFERENCE OF URINE PIGMENT   LEUKOCYTESUR (A) 02/06/2017 1215    TEST NOT REPORTED DUE TO COLOR INTERFERENCE OF URINE PIGMENT    Radiological Exams on Admission: Dg Chest 2 View  Result Date: 03/07/2019 CLINICAL DATA:  Chest pain EXAM: CHEST - 2 VIEW COMPARISON:  February 19, 2019 FINDINGS: There is mild cardiomegaly. No focal airspace consolidation or pleural effusion. Degenerative changes seen in the midthoracic spine. Anterior flowing osteophytes seen. No acute osseous findings. IMPRESSION: No acute cardiopulmonary disease.  Mild cardiomegaly Electronically Signed   By: Prudencio Pair M.D.   On: 03/07/2019 16:19    EKG: Independently reviewed.  Sinus tachycardia, incomplete RBBB, and QTC (504).  Assessment/Plan Principal Problem:   Chest pain Active Problems:   Bilateral lower leg cellulitis   Sinus tachycardia   Chronic venous stasis   QT prolongation   Chest pain, sinus tachycardia Heart rate in the 140s per EMS.  High-sensitivity troponin checked twice negative.   EKG not suggestive of ACS, showing sinus tachycardia. Chest x-ray showing mild cardiomegaly and no acute cardiopulmonary disease.  Currently appears comfortable on exam and chest pain 1/10 in intensity.  Left heart cath done June 2019 showing minimal CAD with ectatic RCA and circumflex.  Patient was seen in the [facility] fib clinic on 7/23 for similar complaints and a 7-day ZIO patch was placed.  He removed the Zio patch earlier today before his episode of heart palpitations.  Although acute infection/cellulitis could be contributing to his sinus tachycardia, these episodes have been occurring for 3 weeks now.   -Cardiac monitoring -Please contact A. fib clinic in a.m. to obtain results of cardiac monitoring. -Check TSH, free T4 -Echocardiogram -Check lactate and UA for further evaluation of infectious etiology. -Check d-dimer, if elevated CT angiogram to rule out PE -IV Cardizem PRN HR greater than 120, hold if SBP less than 100 -IV morphine PRN chest pain -Received aspirin 324 mg by EMS  Cellulitis of bilateral lower extremities Patient has signs of bilateral lower extremity cellulitis on exam.  White count 15.3.  Afebrile. -Cefazolin -Repeat CBC in a.m.  Chronic venous stasis Bilateral lower extremity edema is chronic and at baseline per patient.  No evidence of pulmonary edema on chest x-ray. -We will hold off giving Lasix at this time as blood pressure has been intermittently soft  QTC prolongation on EKG Initial EKG with QTC 504.  Repeat EKG with QTC 451. -Keep potassium greater than 4 and magnesium greater than 2 -Cardiac monitoring -Repeat EKG in a.m. -Avoid QT prolonging drugs  if possible.  Patient is currently on Effexor, Abilify, and trazodone which can prolong QT.  Will not abruptly stop these chronic medications.  Consider discussing with patient's outpatient physician in the morning.  OSA -CPAP at night  Type 2 diabetes -Check A1c.  Sliding scale insulin and CBG checks.  DVT  prophylaxis: Lovenox Code Status: DNR.  Confirmed with patient and signed form present at bedside. Family Communication: No family available at this time. Disposition Plan: Anticipate discharge after clinical improvement. Admission status: It is my clinical opinion that referral for OBSERVATION is reasonable and necessary in this patient based on the above information provided. The aforementioned taken together are felt to place the patient at high risk for further clinical deterioration. However it is anticipated that the patient may be medically stable for discharge from the hospital within 24 to 48 hours.  The medical decision making on this patient was of high complexity and the patient is at high risk for clinical deterioration, therefore this is a level 3 visit.  Shela Leff MD [facility] Pager [phone]  If 7PM-7AM, please contact night-coverage [email or web address] Password [email or web address]  03/07/2019, 10:30 PM

## 2019-03-07 NOTE — ED Provider Notes (Signed)
[facility] EMERGENCY DEPARTMENT Provider Note   CSN: [id number] Arrival date & time: 03/07/19  1522    History   Chief Complaint Chief Complaint  Patient presents with   Chest Pain    HPI Lawrence [name] is a 63 y.o. male with past medical history significant for CHF, COPD, hypertension, type 2 diabetes, sleep apnea presents to emergency department today with chief complaint of chest pain and tachycardia.  Patient states he developed left-sided chest pain after walking from the bedroom to the living room.  He describes the pain as sharp.  Pain radiated to his neck and down his left arm, pain was 7 out of 10 in severity at onset.  He reports associated dizziness and shortness of breath.  He felt like the room was spinning.  He tried sitting down to relieve the pain however that did not help.  Also reports he felt like his heart was fluttering and beating fast.  On EMS arrival heart rate was in the 140s.  He was given 324 mg of aspirin. On arrival he reports pain chest pain is now 4/10 in severity.   Patient was evaluated in outpatient A. fib clinic on 02/19/2019 and wore Holter moniter. He recently turned it in and has not heard results yet. Also Pt had heart cath in 01/2018 with nonobstructive CAD.   Past Medical History:  Diagnosis Date   Adenomatous colon polyp 2007   Anemia    Anxiety    Arthritis    Asthma    Chronic cough    Chronic diastolic CHF (congestive heart failure) (HCC)    COPD (chronic obstructive pulmonary disease) ([place])    Depression    History of cardiac cath    a.  01/2018 with angiographically minimal coronary disease with  up to 40% prox LAD and 20% mid LCx and OM2 with normal LVF and normal LVEDP.[name]   Hypertension    Morbid obesity ([place])    New onset type 2 diabetes mellitus ([place]) 11/08/2017   PTSD (post-traumatic stress disorder)    Sleep apnea    CPAP    Patient Active Problem List   Diagnosis Date Noted   History of  anemia 12/26/2018   Anemia 08/24/2018   History of colon polyps 08/24/2018   Do not resuscitate status 08/24/2018   Chronic diastolic CHF (congestive heart failure) ([place]) 08/18/2018   Leukocytosis 08/18/2018   Bilateral lower leg cellulitis    Coronary artery calcification seen on CAT scan    Pure hypercholesterolemia    Chest pain    Shortness of breath    Acute respiratory failure with hypoxia ([place]) 01/13/2018   Right foot pain 01/13/2018   Acute on chronic diastolic CHF (congestive heart failure) ([place]) 01/13/2018   Diabetes mellitus type 2 in obese ([place]) 01/13/2018   New onset type 2 diabetes mellitus ([place]) 11/08/2017   Cellulitis 11/07/2017   Primary osteoarthritis, left shoulder 03/05/2017   Essential hypertension, benign 02/22/2017   BPH without obstruction/lower urinary tract symptoms 02/22/2017   Hematuria 02/22/2017   Peripheral neuropathy 02/22/2017   Physical deconditioning 02/22/2017   Morbid obesity ([place])    PTSD (post-traumatic stress disorder)    Anasarca 01/31/2017   Bilateral lower extremity edema 01/31/2017   Cellulitis of lower extremity    OSA (obstructive sleep apnea)     Past Surgical History:  Procedure Laterality Date   JOINT REPLACEMENT     left knee replacement x 2   KNEE ARTHROSCOPY Bilateral  LEFT HEART CATH AND CORONARY ANGIOGRAPHY N/A 01/17/2018   Procedure: LEFT HEART CATH AND CORONARY ANGIOGRAPHY;  Surgeon: Leonie Man, MD;  Location: [facility];  Service: Cardiovascular;  Laterality: N/A;   UMBILICAL HERNIA REPAIR          Home Medications    Prior to Admission medications   Medication Sig Start Date End Date Taking? Authorizing Provider  acetaminophen (TYLENOL) 500 MG tablet Take 500 mg by mouth 4 (four) times daily as needed for mild pain.     [provider]  albuterol (PROVENTIL HFA;VENTOLIN HFA) 108 (90 Base) MCG/ACT inhaler Inhale 2 puffs into the lungs every 6 (six) hours as  needed for wheezing or shortness of breath.    [provider]  allopurinol (ZYLOPRIM) 100 MG tablet Take 1 tablet (100 mg total) by mouth daily. 01/18/18   Domenic Polite, MD  ARIPiprazole (ABILIFY) 5 MG tablet Take 5 mg by mouth at bedtime.     [provider]  aspirin EC 81 MG EC tablet Take 1 tablet (81 mg total) by mouth daily. 01/19/18   Domenic Polite, MD  atorvastatin (LIPITOR) 40 MG tablet Take 1 tablet (40 mg total) by mouth daily at 6 PM. 01/18/18   Domenic Polite, MD  blood glucose meter kit and supplies Dispense based on patient and insurance preference. Use up to four times daily as directed. (FOR ICD-10 E10.9, E11.9). 11/09/17   Eugenie Filler, MD  cetirizine (ZYRTEC) 10 MG tablet Take 10 mg by mouth daily.    [provider]  ferrous sulfate 324 (65 Fe) MG TBEC Take by mouth.    [provider]  fluticasone (FLONASE) 50 MCG/ACT nasal spray Place 1 spray into both nostrils daily.     [provider]  furosemide (LASIX) 80 MG tablet Take 80 mg by mouth 2 (two) times daily.    [provider]  lisinopril (PRINIVIL,ZESTRIL) 10 MG tablet Take 10 mg by mouth daily.    [provider]  loperamide (IMODIUM) 2 MG capsule Take 2 mg by mouth as needed for diarrhea or loose stools.    [provider]  metFORMIN (GLUCOPHAGE) 500 MG tablet Take 1 tablet (500 mg total) by mouth 2 (two) times daily with a meal. 11/09/17 02/28/19  Eugenie Filler, MD  prazosin (MINIPRESS) 2 MG capsule Take 4 mg by mouth at bedtime.    [provider]  pregabalin (LYRICA) 75 MG capsule Take 75 mg by mouth 2 (two) times daily.    [provider]  silver sulfADIAZINE (SILVADENE) 1 % cream Apply 1 application topically daily. Una boot (both legs)    [provider]  temazepam (RESTORIL) 15 MG capsule Take 15 mg by mouth at bedtime.     [provider]  traZODone (DESYREL) 100 MG tablet Take 200 mg by mouth at  bedtime.    [provider]  Trolamine Salicylate (ASPERCREME EX) Apply 1 application topically 2 (two) times daily as needed (pain and inflammation).    [provider]  venlafaxine XR (EFFEXOR-XR) 150 MG 24 hr capsule Take 150 mg by mouth daily with breakfast.    [provider]    Family History Family History  Problem Relation Age of Onset   CAD Maternal Grandfather    Diabetes Other    Diabetes Mellitus II Neg Hx    Colon cancer Neg Hx    Esophageal cancer Neg Hx    Inflammatory bowel disease Neg Hx  Liver disease Neg Hx    Pancreatic cancer Neg Hx    Rectal cancer Neg Hx    Stomach cancer Neg Hx     Social History Social History   Tobacco Use   Smoking status: Former Smoker    Quit date: 03/07/2016    Years since quitting: 3.0   Smokeless tobacco: Never Used  Substance Use Topics   Alcohol use: Yes    Comment: rare   Drug use: No     Allergies   Tubersol [tuberculin], Vancomycin, and Doxycycline   Review of Systems Review of Systems  Constitutional: Negative for chills and fever.  HENT: Negative for congestion, rhinorrhea, sinus pressure and sore throat.   Eyes: Negative for pain and redness.  Respiratory: Positive for shortness of breath. Negative for cough and wheezing.   Cardiovascular: Positive for chest pain, palpitations and leg swelling.  Gastrointestinal: Negative for abdominal pain, constipation, diarrhea, nausea and vomiting.  Genitourinary: Negative for dysuria.  Musculoskeletal: Negative for arthralgias, back pain, myalgias and neck pain.  Skin: Negative for rash and wound.  Neurological: Negative for dizziness, syncope, weakness, numbness and headaches.  Psychiatric/Behavioral: Negative for confusion.     Physical Exam Updated Vital Signs BP (!) 132/96    Pulse 92    Temp 98.7 F (37.1 C) (Oral)    Resp (!) 27    Ht '6\' 1"'$  (1.854 m)    Wt (!) 181.4 kg    SpO2 96%    BMI 52.77 kg/m   Physical  Exam Vitals signs and nursing note reviewed.  Constitutional:      General: He is in acute distress.     Appearance: He is obese. He is diaphoretic.  HENT:     Head: Normocephalic and atraumatic.     Right Ear: Tympanic membrane and external ear normal.     Left Ear: Tympanic membrane and external ear normal.     Nose: Nose normal.     Mouth/Throat:     Mouth: Mucous membranes are moist.     Pharynx: Oropharynx is clear.  Eyes:     General: No scleral icterus.       Right eye: No discharge.        Left eye: No discharge.     Extraocular Movements: Extraocular movements intact.     Conjunctiva/sclera: Conjunctivae normal.     Pupils: Pupils are equal, round, and reactive to light.  Neck:     Musculoskeletal: Normal range of motion.     Vascular: No JVD.  Cardiovascular:     Rate and Rhythm: Regular rhythm.     Pulses: Normal pulses.          Radial pulses are 2+ on the right side and 2+ on the left side.     Heart sounds: Normal heart sounds.     Comments: Tachycardic to 140 Pulmonary:     Comments: Lungs clear to auscultation in all fields. Symmetric chest rise. No wheezing, rales, or rhonchi. Abdominal:     Comments: Abdomen is soft, non-distended, and non-tender in all quadrants. No rigidity, no guarding. No peritoneal signs.  Musculoskeletal: Normal range of motion.     Right lower leg: Edema present.     Left lower leg: Edema present.     Comments: Non pitting bilateral lower extremity edema. There is erythema to bilateral ankles with healing wounds. No drainage or warmth  Skin:    General: Skin is warm.     Capillary Refill: Capillary refill takes  less than 2 seconds.  Neurological:     Mental Status: He is oriented to person, place, and time.     GCS: GCS eye subscore is 4. GCS verbal subscore is 5. GCS motor subscore is 6.     Comments: Fluent speech, no facial droop.  Psychiatric:        Behavior: Behavior normal.      ED Treatments / Results  Labs (all  labs ordered are listed, but only abnormal results are displayed) Labs Reviewed  BASIC METABOLIC PANEL - Abnormal; Notable for the following components:      Result Value   Sodium 133 (*)    Chloride 95 (*)    Glucose, Bld 176 (*)    BUN 27 (*)    All other components within normal limits  CBC - Abnormal; Notable for the following components:   WBC 15.3 (*)    Hemoglobin 12.9 (*)    All other components within normal limits  SARS CORONAVIRUS 2 (HOSPITAL ORDER, [facility])  TROPONIN I (HIGH SENSITIVITY)  TROPONIN I (HIGH SENSITIVITY)    EKG EKG Interpretation  Date/Time:  Thursday March 07 2019 15:29:02 EDT Ventricular Rate:  135 PR Interval:    QRS Duration: 112 QT Interval:  336 QTC Calculation: 504 R Axis:   -80 Text Interpretation:  Sinus tachycardia Incomplete right bundle branch block Abnormal R-wave progression, late transition Inferior infarct, old Prolonged QT interval Confirmed by Veryl Speak [phone]) on 03/07/2019 3:32:36 PM   Radiology Dg Chest 2 View  Result Date: 03/07/2019 CLINICAL DATA:  Chest pain EXAM: CHEST - 2 VIEW COMPARISON:  February 19, 2019 FINDINGS: There is mild cardiomegaly. No focal airspace consolidation or pleural effusion. Degenerative changes seen in the midthoracic spine. Anterior flowing osteophytes seen. No acute osseous findings. IMPRESSION: No acute cardiopulmonary disease.  Mild cardiomegaly Electronically Signed   By: Prudencio Pair M.D.   On: 03/07/2019 16:19    Procedures Procedures (including critical care time)  Medications Ordered in ED Medications  sodium chloride 0.9 % bolus 500 mL (0 mLs Intravenous Stopped 03/07/19 1859)  morphine 4 MG/ML injection 4 mg (4 mg Intravenous Given 03/07/19 1639)  ondansetron (ZOFRAN) injection 4 mg (4 mg Intravenous Given 03/07/19 1639)  morphine 4 MG/ML injection 4 mg (4 mg Intravenous Given 03/07/19 2104)     Initial Impression / Assessment and Plan / ED Course  I have  reviewed the triage vital signs and the nursing notes.  Pertinent labs & imaging results that were available during my care of the patient were reviewed by me and considered in my medical decision making (see chart for details).  Pt is in acute distress on arrival, heart rate in the 140s. He is diaphoretic.  Lungs clear to auscultation, abdomen nontender, no peritoneal signs.  He has bilateral edema to lower extremities that he reports is unchanged today.  He has what appears to be healing wounds on bilateral shins. No obvious signs of infection.  Labs are significant for leukocytosis of 15.3. Looking through his chart he has elevated WBC multiple times in last 6 months. Delta troponin is stable at 6. Chest xray shows mild cardiomegaly. EKG shows sinus tachycardia with incomplete RBBB. On reassessment chest pain improved after morphine. He continues to be tachycardic ranging from 99-110. Discussed case with cardiology who recommends medicine admission with cardiology consult if needed. This case was discussed with Dr. Stark Jock who has seen the patient and agrees with plan to admit.  Spoke with Dr. Marlowe Sax with hospitalist service who agrees to assume care of patient and bring into the hospital for further evaluation and management.    This note was prepared using Dragon voice recognition software and may include unintentional dictation errors due to the inherent limitations of voice recognition software.    Final Clinical Impressions(s) / ED Diagnoses   Final diagnoses:  SVT (supraventricular tachycardia) [facility])    ED Discharge Orders    None       Flint Melter 03/07/19 2114    Veryl Speak, MD 03/10/19 [phone]

## 2019-03-07 NOTE — ED Notes (Signed)
ED TO INPATIENT HANDOFF REPORT  ED Nurse Name and Phone #: Lorrin Goodell [phone]  S Name/Age/Gender Lawrence [name] 63 y.o. male Room/Bed: 043C/043C  Code Status   Code Status: DNR  Home/SNF/Other Home Patient oriented to: self, place, time and situation Is this baseline? Yes   Triage Complete: Triage complete  Chief Complaint Chest Pain; Tachycardia  Triage Note Pt via [facility] chest pain, SOB, Dizziness and tachycardiax 3-4 wks. Today while walking felt like heart was "fluttering, dizzy, SOB, pain up left side of neck and left arm and it lasted over an hour after sitting down. Per EMS pt hr 140s. EMS gave ASA 324mg    Allergies Allergies  Allergen Reactions  . Tubersol [Tuberculin] Other (See Comments)    Reaction unknown  . Vancomycin Other (See Comments)    Red Man Syndrome  . Doxycycline Rash    Level of Care/Admitting Diagnosis ED Disposition    ED Disposition Condition [facility] Area: [place] [[id number]]  Level of Care: Progressive [102]  I expect the patient will be discharged within 24 hours: No (not a candidate for 5C-Observation unit)  Covid Evaluation: Person Under Investigation (PUI)  Diagnosis: Chest pain [789381]  Admitting Physician: Shela Leff [[id number]]  Attending Physician: Shela Leff [[id number]]  PT Class (Do Not Modify): Observation [104]  PT Acc Code (Do Not Modify): Observation [10022]       B Medical/Surgery History Past Medical History:  Diagnosis Date  . Adenomatous colon polyp 2007  . Anemia   . Anxiety   . Arthritis   . Asthma   . Chronic cough   . Chronic diastolic CHF (congestive heart failure) ([place])   . COPD (chronic obstructive pulmonary disease) ([place])   . Depression   . History of cardiac cath    a.  01/2018 with angiographically minimal coronary disease with  up to 40% prox LAD and 20% mid LCx and OM2 with normal LVF and normal LVEDP..  . Hypertension   . Morbid obesity ([place])   .  New onset type 2 diabetes mellitus ([place]) 11/08/2017  . PTSD (post-traumatic stress disorder)   . Sleep apnea    CPAP   Past Surgical History:  Procedure Laterality Date  . JOINT REPLACEMENT     left knee replacement x 2  . KNEE ARTHROSCOPY Bilateral   . LEFT HEART CATH AND CORONARY ANGIOGRAPHY N/A 01/17/2018   Procedure: LEFT HEART CATH AND CORONARY ANGIOGRAPHY;  Surgeon: Leonie Man, MD;  Location: [facility];  Service: Cardiovascular;  Laterality: N/A;  . UMBILICAL HERNIA REPAIR       A IV Location/Drains/Wounds Patient Lines/Drains/Airways Status   Active Line/Drains/Airways    Name:   Placement date:   Placement time:   Site:   Days:   Peripheral IV 03/07/19 Left Wrist   03/07/19    1543    Wrist   less than 1   Wound / Incision (Open or Dehisced) 11/07/17 Other (Comment) Leg Right;Left weeping   11/07/17    1648    Leg   485          Intake/Output Last 24 hours  Intake/Output Summary (Last 24 hours) at 03/07/2019 2250 Last data filed at 03/07/2019 1859 Gross per 24 hour  Intake 500 ml  Output -  Net 500 ml    Labs/Imaging Results for orders placed or performed during the hospital encounter of 03/07/19 (from the past 48 hour(s))  Basic metabolic panel  Status: Abnormal   Collection Time: 03/07/19  3:41 PM  Result Value Ref Range   Sodium 133 (L) 135 - 145 mmol/L   Potassium 3.9 3.5 - 5.1 mmol/L   Chloride 95 (L) 98 - 111 mmol/L   CO2 25 22 - 32 mmol/L   Glucose, Bld 176 (H) 70 - 99 mg/dL   BUN 27 (H) 8 - 23 mg/dL   Creatinine, Ser 1.05 0.61 - 1.24 mg/dL   Calcium 8.9 8.9 - 10.3 mg/dL   GFR calc non Af Amer >60 >60 mL/min   GFR calc Af Amer >60 >60 mL/min   Anion gap 13 5 - 15    Comment: Performed at [facility] [address], [place], [place] [address]  CBC     Status: Abnormal   Collection Time: 03/07/19  3:41 PM  Result Value Ref Range   WBC 15.3 (H) 4.0 - 10.5 K/uL   RBC 4.43 4.22 - 5.81 MIL/uL   Hemoglobin 12.9 (L) 13.0 - 17.0  g/dL   HCT 39.4 39.0 - 52.0 %   MCV 88.9 80.0 - 100.0 fL   MCH 29.1 26.0 - 34.0 pg   MCHC 32.7 30.0 - 36.0 g/dL   RDW 14.0 11.5 - 15.5 %   Platelets 295 150 - 400 K/uL   nRBC 0.0 0.0 - 0.2 %    Comment: Performed at [facility], [place] [address], [place], [place] [address]  Troponin I (High Sensitivity)     Status: None   Collection Time: 03/07/19  3:41 PM  Result Value Ref Range   Troponin I (High Sensitivity) 6 <18 ng/L    Comment: (NOTE) Elevated high sensitivity troponin I (hsTnI) values and significant  changes across serial measurements may suggest ACS but many other  chronic and acute conditions are known to elevate hsTnI results.  Refer to the "Links" section for chest pain algorithms and additional  guidance. Performed at [facility], [place] [address], [place], [place] [address]   Troponin I (High Sensitivity)     Status: None   Collection Time: 03/07/19  5:36 PM  Result Value Ref Range   Troponin I (High Sensitivity) 6 <18 ng/L    Comment: (NOTE) Elevated high sensitivity troponin I (hsTnI) values and significant  changes across serial measurements may suggest ACS but many other  chronic and acute conditions are known to elevate hsTnI results.  Refer to the "Links" section for chest pain algorithms and additional  guidance. Performed at [facility], [place] [address] [name], [place] [address]   CBG monitoring, ED     Status: Abnormal   Collection Time: 03/07/19  9:39 PM  Result Value Ref Range   Glucose-Capillary 139 (H) 70 - 99 mg/dL   Dg Chest 2 View  Result Date: 03/07/2019 CLINICAL DATA:  Chest pain EXAM: CHEST - 2 VIEW COMPARISON:  February 19, 2019 FINDINGS: There is mild cardiomegaly. No focal airspace consolidation or pleural effusion. Degenerative changes seen in the midthoracic spine. Anterior flowing osteophytes seen. No acute osseous findings. IMPRESSION: No acute cardiopulmonary disease.  Mild cardiomegaly Electronically Signed   By: Prudencio Pair M.D.   On: 03/07/2019 16:19    Pending Labs Unresulted Labs (From admission, onward)    Start     Ordered   03/08/19 0500  CBC  Tomorrow morning,   R     03/07/19 2113   03/08/19 0500  Hemoglobin A1c  Tomorrow morning,  R    Comments: To assess prior glycemic control    03/07/19 2113   03/07/19 2111  D-dimer, quantitative (not at [facility])  ONCE - STAT,   STAT     03/07/19 2113   03/07/19 2107  Lactic acid, plasma  ONCE - STAT,   STAT     03/07/19 2113   03/07/19 2107  Urinalysis, Routine w reflex microscopic  ONCE - STAT,   STAT     03/07/19 2113   03/07/19 2106  TSH  Add-on,   AD     03/07/19 2113   03/07/19 2106  T4, free  Add-on,   AD     03/07/19 2113   03/07/19 2106  Magnesium  Add-on,   AD     03/07/19 2113   03/07/19 2050  SARS Coronavirus 2 (CEPHEID - Performed in [facility] hospital lab), Hosp Order  (Asymptomatic Patients Labs)  Once,   STAT    Question:  Rule Out  Answer:  Yes   03/07/19 2050          Vitals/Pain Today's Vitals   03/07/19 2115 03/07/19 2130 03/07/19 2143 03/07/19 2230  BP: (!) 123/45 (!) 124/58  116/72  Pulse: 91 82  92  Resp: 16 15  14   Temp:      TempSrc:      SpO2: 94% 96%  95%  Weight:      Height:      PainSc:   2      Isolation Precautions Airborne and Contact precautions  Medications Medications  enoxaparin (LOVENOX) injection 40 mg (has no administration in time range)  morphine 2 MG/ML injection 0.5 mg (has no administration in time range)  insulin aspart (novoLOG) injection 0-9 Units (has no administration in time range)  insulin aspart (novoLOG) injection 0-5 Units (0 Units Subcutaneous Not Given 03/07/19 2143)  diltiazem (CARDIZEM) injection 10 mg (has no administration in time range)  ceFAZolin (ANCEF) IVPB 2g/100 mL premix (2 g Intravenous New Bag/Given 03/07/19 2231)  allopurinol (ZYLOPRIM) tablet 100 mg (has no administration in time range)  ARIPiprazole (ABILIFY) tablet 5 mg (has no administration in time range)   aspirin EC tablet 81 mg (has no administration in time range)  atorvastatin (LIPITOR) tablet 40 mg (has no administration in time range)  loratadine (CLARITIN) tablet 10 mg (has no administration in time range)  ferrous sulfate TBEC 325 mg (has no administration in time range)  fluticasone (FLONASE) 50 MCG/ACT nasal spray 1 spray (has no administration in time range)  pregabalin (LYRICA) capsule 75 mg (has no administration in time range)  temazepam (RESTORIL) capsule 15 mg (has no administration in time range)  traZODone (DESYREL) tablet 200 mg (has no administration in time range)  venlafaxine XR (EFFEXOR-XR) 24 hr capsule 150 mg (has no administration in time range)  sodium chloride 0.9 % bolus 500 mL (0 mLs Intravenous Stopped 03/07/19 1859)  morphine 4 MG/ML injection 4 mg (4 mg Intravenous Given 03/07/19 1639)  ondansetron (ZOFRAN) injection 4 mg (4 mg Intravenous Given 03/07/19 1639)  morphine 4 MG/ML injection 4 mg (4 mg Intravenous Given 03/07/19 2104)  potassium chloride SA (K-DUR) CR tablet 20 mEq (20 mEq Oral Given 03/07/19 2143)    Mobility walks Moderate fall risk   Focused Assessments Cardiac Assessment Handoff:  Cardiac Rhythm: Sinus tachycardia Lab Results  Component Value Date   TROPONINI <0.03 08/18/2018   Lab Results  Component Value Date   DDIMER 1.71 (H) 01/13/2018   Does the  Patient currently have chest pain? No     R Recommendations: See Admitting Provider Note  Report given to:   Additional Notes:

## 2019-03-07 NOTE — Telephone Encounter (Signed)
Patient states he took his heart monitor off this morning as scheduled. About an hour ago he felt his heart racing HR is 139 BP 96/72 feeling dizzy/lightheaded some chest pain noted. Discussed with Adline Peals, PA recommends pt proceed to ER for further assessment as pt has hypotension and is symptomatic.

## 2019-03-08 ENCOUNTER — Encounter (HOSPITAL_COMMUNITY): Payer: Self-pay | Admitting: *Deleted

## 2019-03-08 ENCOUNTER — Observation Stay (HOSPITAL_BASED_OUTPATIENT_CLINIC_OR_DEPARTMENT_OTHER): Payer: No Typology Code available for payment source

## 2019-03-08 DIAGNOSIS — R0789 Other chest pain: Secondary | ICD-10-CM | POA: Diagnosis present

## 2019-03-08 DIAGNOSIS — R000 Tachycardia, unspecified: Secondary | ICD-10-CM

## 2019-03-08 DIAGNOSIS — I5032 Chronic diastolic (congestive) heart failure: Secondary | ICD-10-CM | POA: Diagnosis present

## 2019-03-08 DIAGNOSIS — L03115 Cellulitis of right lower limb: Secondary | ICD-10-CM | POA: Diagnosis present

## 2019-03-08 DIAGNOSIS — Z87891 Personal history of nicotine dependence: Secondary | ICD-10-CM | POA: Diagnosis not present

## 2019-03-08 DIAGNOSIS — L03116 Cellulitis of left lower limb: Secondary | ICD-10-CM | POA: Diagnosis present

## 2019-03-08 DIAGNOSIS — F329 Major depressive disorder, single episode, unspecified: Secondary | ICD-10-CM | POA: Diagnosis present

## 2019-03-08 DIAGNOSIS — M19012 Primary osteoarthritis, left shoulder: Secondary | ICD-10-CM | POA: Diagnosis present

## 2019-03-08 DIAGNOSIS — Z20828 Contact with and (suspected) exposure to other viral communicable diseases: Secondary | ICD-10-CM | POA: Diagnosis present

## 2019-03-08 DIAGNOSIS — E7800 Pure hypercholesterolemia, unspecified: Secondary | ICD-10-CM | POA: Diagnosis present

## 2019-03-08 DIAGNOSIS — R9431 Abnormal electrocardiogram [ECG] [EKG]: Secondary | ICD-10-CM | POA: Diagnosis not present

## 2019-03-08 DIAGNOSIS — I4581 Long QT syndrome: Secondary | ICD-10-CM | POA: Diagnosis present

## 2019-03-08 DIAGNOSIS — R079 Chest pain, unspecified: Secondary | ICD-10-CM | POA: Diagnosis not present

## 2019-03-08 DIAGNOSIS — M109 Gout, unspecified: Secondary | ICD-10-CM | POA: Diagnosis present

## 2019-03-08 DIAGNOSIS — Z6841 Body Mass Index (BMI) 40.0 and over, adult: Secondary | ICD-10-CM | POA: Diagnosis not present

## 2019-03-08 DIAGNOSIS — I4519 Other right bundle-branch block: Secondary | ICD-10-CM | POA: Diagnosis present

## 2019-03-08 DIAGNOSIS — I471 Supraventricular tachycardia: Principal | ICD-10-CM

## 2019-03-08 DIAGNOSIS — I110 Hypertensive heart disease with heart failure: Secondary | ICD-10-CM | POA: Diagnosis present

## 2019-03-08 DIAGNOSIS — F4310 Post-traumatic stress disorder, unspecified: Secondary | ICD-10-CM | POA: Diagnosis present

## 2019-03-08 DIAGNOSIS — Z8249 Family history of ischemic heart disease and other diseases of the circulatory system: Secondary | ICD-10-CM | POA: Diagnosis not present

## 2019-03-08 DIAGNOSIS — I2510 Atherosclerotic heart disease of native coronary artery without angina pectoris: Secondary | ICD-10-CM | POA: Diagnosis present

## 2019-03-08 DIAGNOSIS — J449 Chronic obstructive pulmonary disease, unspecified: Secondary | ICD-10-CM | POA: Diagnosis present

## 2019-03-08 DIAGNOSIS — Z66 Do not resuscitate: Secondary | ICD-10-CM | POA: Diagnosis present

## 2019-03-08 DIAGNOSIS — F419 Anxiety disorder, unspecified: Secondary | ICD-10-CM | POA: Diagnosis present

## 2019-03-08 DIAGNOSIS — E1142 Type 2 diabetes mellitus with diabetic polyneuropathy: Secondary | ICD-10-CM | POA: Diagnosis present

## 2019-03-08 DIAGNOSIS — G4733 Obstructive sleep apnea (adult) (pediatric): Secondary | ICD-10-CM | POA: Diagnosis present

## 2019-03-08 DIAGNOSIS — I872 Venous insufficiency (chronic) (peripheral): Secondary | ICD-10-CM | POA: Diagnosis present

## 2019-03-08 LAB — CBC
HCT: 39.5 % (ref 39.0–52.0)
Hemoglobin: 12.8 g/dL — ABNORMAL LOW (ref 13.0–17.0)
MCH: 28.6 pg (ref 26.0–34.0)
MCHC: 32.4 g/dL (ref 30.0–36.0)
MCV: 88.4 fL (ref 80.0–100.0)
Platelets: 309 10*3/uL (ref 150–400)
RBC: 4.47 MIL/uL (ref 4.22–5.81)
RDW: 14.2 % (ref 11.5–15.5)
WBC: 11.5 10*3/uL — ABNORMAL HIGH (ref 4.0–10.5)
nRBC: 0 % (ref 0.0–0.2)

## 2019-03-08 LAB — ECHOCARDIOGRAM COMPLETE
Height: 73 in
Weight: 6366.88 oz

## 2019-03-08 LAB — MAGNESIUM: Magnesium: 1.8 mg/dL (ref 1.7–2.4)

## 2019-03-08 LAB — T4, FREE: Free T4: 0.94 ng/dL (ref 0.61–1.12)

## 2019-03-08 LAB — LACTIC ACID, PLASMA: Lactic Acid, Venous: 1.4 mmol/L (ref 0.5–1.9)

## 2019-03-08 LAB — GLUCOSE, CAPILLARY
Glucose-Capillary: 179 mg/dL — ABNORMAL HIGH (ref 70–99)
Glucose-Capillary: 209 mg/dL — ABNORMAL HIGH (ref 70–99)
Glucose-Capillary: 182 mg/dL — ABNORMAL HIGH (ref 70–99)
Glucose-Capillary: 135 mg/dL — ABNORMAL HIGH (ref 70–99)

## 2019-03-08 LAB — HEMOGLOBIN A1C
Hgb A1c MFr Bld: 9.4 % — ABNORMAL HIGH (ref 4.8–5.6)
Mean Plasma Glucose: 223.08 mg/dL

## 2019-03-08 LAB — D-DIMER, QUANTITATIVE: D-Dimer, Quant: 0.69 ug/mL-FEU — ABNORMAL HIGH (ref 0.00–0.50)

## 2019-03-08 LAB — TSH: TSH: 1.648 u[IU]/mL (ref 0.350–4.500)

## 2019-03-08 LAB — MRSA PCR SCREENING: MRSA by PCR: NEGATIVE

## 2019-03-08 MED ORDER — HYDROCODONE-ACETAMINOPHEN 7.5-325 MG PO TABS: 1.0000 | ORAL_TABLET | Freq: Four times a day (QID) | ORAL | Status: DC | PRN

## 2019-03-08 MED ORDER — IBUPROFEN 200 MG PO TABS
600.0000 mg | ORAL_TABLET | Freq: Four times a day (QID) | ORAL | Status: DC | PRN
  Administered 2019-03-08 – 2019-03-09 (×3): 600 mg via ORAL
  Filled 2019-03-08 (×4): qty 3

## 2019-03-08 MED ORDER — METOPROLOL SUCCINATE ER 50 MG PO TB24
50.0000 mg | ORAL_TABLET | Freq: Every day | ORAL | Status: DC
  Administered 2019-03-08 – 2019-03-09 (×2): 50 mg via ORAL
  Filled 2019-03-08 (×2): qty 1

## 2019-03-08 MED ORDER — MAGNESIUM SULFATE IN D5W 1-5 GM/100ML-% IV SOLN
1.0000 g | Freq: Once | INTRAVENOUS | Status: AC
  Administered 2019-03-08: 1 g via INTRAVENOUS
  Filled 2019-03-08: qty 100

## 2019-03-08 MED ORDER — ORAL CARE MOUTH RINSE
15.0000 mL | Freq: Two times a day (BID) | OROMUCOSAL | Status: DC
  Administered 2019-03-08 – 2019-03-09 (×2): 15 mL via OROMUCOSAL

## 2019-03-08 NOTE — Consult Note (Addendum)
Cardiology Consultation:   Patient ID: [name] MRN: [id number]; DOB: Jul 08, 1956  Admit date: 03/07/2019 Date of Consult: 03/08/2019  Primary Care Provider: [facility] Primary Cardiologist: Fransico Him, MD  Primary Electrophysiologist:  None    Patient Profile:   Lawrence [name] is a 63 y.o. male with a hx of minimal CAD by cath 01/2018, racing HR followed in a fib clinic, hx of atypical chest pain dating back years,   HTN, HLD, DM chronic diastolic HF, OSA with BiPap , PTSD, morbid obesity, gout anxiety and DNR who is being seen today for the evaluation of palpitations and chest pain at the request of Dr. Tawanna Solo.  History of Present Illness:   Lawrence [name] with above hx and minimal CAD on cath last year has recently been seen in A fib clinic for heart racing and sharp lt sided chest pain lasting 5-10 min. These symptoms began 3-4 weeks ago.  Pt did have short burst of SVT on tele in Jan 2018-11-11 beats and in 01/2017 same issue.  He had event monitor placed.  His home health nurse noted elevated HR up to 140 on 2 separate occ. unsure how she evaluated.  Has not had documented a fib.   Echo a year ago with EF 60-65%,  G2DD.   Has chronic diastolic HF and at home is on lasix 80 BID   Pt wore 7 day heart monitor and after taking off he felt his heart racing - noted 139 per EMS and BP 96/72 and was dizzy/lightheaded and some chest pain, asked to go to ER.  Lasted an hour.      EKG:  The EKG was personally reviewed and demonstrates:  ST with HR at 23 old Q wave in AVF and other than faster than previous no changes.   Follow up with HR to 105. Another EKG with short burst 5 beats of SVT.  Telemetry:  Telemetry was personally reviewed and demonstrates:  SR   HS troponin neg at 6 X 3  TSH 1.648, free T 4 0.94 History of hypertension does not want lisinopril COVID neg  Hgb A1c 9.4  DDimer 0.69  Na 133, K+ 3.9 Mg+ 1.8, Cr 1.05  Hgb 12.9  WBC 15.3  DDimer 0.69  CXR NAD mild  cardiomegaly  Currently resting in bed.   BP 121/83 P 80s, SP02 94% while wearing monitor he may have had 5 min or so rapid HR but after he removed and mailed in is when longer bout occurred.   Usually takes a week for results. For zio patch.   Heart Pathway Score:     Past Medical History:  Diagnosis Date  . Adenomatous colon polyp 2007  . Anemia   . Anxiety   . Arthritis   . Asthma   . Chronic cough   . Chronic diastolic CHF (congestive heart failure) ([place])   . COPD (chronic obstructive pulmonary disease) ([place])   . Depression   . History of cardiac cath    a.  01/2018 with angiographically minimal coronary disease with  up to 40% prox LAD and 20% mid LCx and OM2 with normal LVF and normal LVEDP..  . Hypertension   . Morbid obesity ([place])   . New onset type 2 diabetes mellitus ([place]) 11/08/2017  . PTSD (post-traumatic stress disorder)   . Sleep apnea    CPAP    Past Surgical History:  Procedure Laterality Date  . JOINT REPLACEMENT     left knee replacement x 2  .  KNEE ARTHROSCOPY Bilateral   . LEFT HEART CATH AND CORONARY ANGIOGRAPHY N/A 01/17/2018   Procedure: LEFT HEART CATH AND CORONARY ANGIOGRAPHY;  Surgeon: Leonie Man, MD;  Location: [facility];  Service: Cardiovascular;  Laterality: N/A;  . UMBILICAL HERNIA REPAIR       Home Medications:  Prior to Admission medications   Medication Sig Start Date End Date Taking? Authorizing Provider  acetaminophen (TYLENOL) 500 MG tablet Take 500 mg by mouth 4 (four) times daily as needed for mild pain.    Yes [provider]  allopurinol (ZYLOPRIM) 100 MG tablet Take 1 tablet (100 mg total) by mouth daily. 01/18/18  Yes Domenic Polite, MD  ARIPiprazole (ABILIFY) 5 MG tablet Take 5 mg by mouth at bedtime.    Yes [provider]  aspirin EC 81 MG EC tablet Take 1 tablet (81 mg total) by mouth daily. 01/19/18  Yes Domenic Polite, MD  atorvastatin (LIPITOR) 40 MG tablet Take 1 tablet (40 mg total) by mouth  daily at 6 PM. 01/18/18  Yes Domenic Polite, MD  cetirizine (ZYRTEC) 10 MG tablet Take 10 mg by mouth daily.   Yes [provider]  ferrous sulfate 324 (65 Fe) MG TBEC Take 1 tablet by mouth daily.    Yes [provider]  fluticasone (FLONASE) 50 MCG/ACT nasal spray Place 1 spray into both nostrils daily.    Yes [provider]  furosemide (LASIX) 80 MG tablet Take 80 mg by mouth 2 (two) times daily.   Yes [provider]  lisinopril (PRINIVIL,ZESTRIL) 10 MG tablet Take 10 mg by mouth daily.   Yes [provider]  loperamide (IMODIUM) 2 MG capsule Take 2 mg by mouth 2 (two) times a day.    Yes [provider]  meloxicam (MOBIC) 7.5 MG tablet Take 7.5 mg by mouth daily as needed for pain.   Yes [provider]  metFORMIN (GLUCOPHAGE) 1000 MG tablet Take 1,000 mg by mouth 2 (two) times daily with a meal.   Yes [provider]  prazosin (MINIPRESS) 2 MG capsule Take 4 mg by mouth at bedtime.   Yes [provider]  pregabalin (LYRICA) 75 MG capsule Take 75 mg by mouth 2 (two) times daily.   Yes [provider]  silver sulfADIAZINE (SILVADENE) 1 % cream Apply 1 application topically daily. Una boot (both legs)   Yes [provider]  temazepam (RESTORIL) 15 MG capsule Take 15 mg by mouth at bedtime.    Yes [provider]  traZODone (DESYREL) 100 MG tablet Take 200 mg by mouth at bedtime.   Yes [provider]  Trolamine Salicylate (ASPERCREME EX) Apply 1 application topically 2 (two) times daily as needed (pain and inflammation).   Yes [provider]  venlafaxine XR (EFFEXOR-XR) 150 MG 24 hr capsule Take 150 mg by mouth daily with breakfast.   Yes [provider]  blood glucose meter kit and supplies Dispense based on patient and insurance preference. Use up to four times daily as directed. (FOR ICD-10 E10.9, E11.9). 11/09/17   Eugenie Filler, MD    Inpatient  Medications: Scheduled Meds: . allopurinol  100 mg Oral Daily  . ARIPiprazole  5 mg Oral QHS  . aspirin EC  81 mg Oral Daily  . atorvastatin  40 mg Oral q1800  . enoxaparin (LOVENOX) injection  90 mg Subcutaneous Daily  . ferrous sulfate  325 mg Oral Q breakfast  . fluticasone  1 spray  Each Nare Daily  . insulin aspart  0-5 Units Subcutaneous QHS  . insulin aspart  0-9 Units Subcutaneous TID WC  . loratadine  10 mg Oral Daily  . mouth rinse  15 mL Mouth Rinse BID  . pregabalin  75 mg Oral BID  . temazepam  15 mg Oral QHS  . traZODone  200 mg Oral QHS  . venlafaxine XR  150 mg Oral Q breakfast   Continuous Infusions: .  ceFAZolin (ANCEF) IV 2 g (03/08/19 0555)   PRN Meds: diltiazem, morphine injection  Allergies:    Allergies  Allergen Reactions  . Tubersol [Tuberculin] Other (See Comments)    Reaction unknown  . Vancomycin Other (See Comments)    Red Man Syndrome  . Doxycycline Rash    Social History:   Social History   Socioeconomic History  . Marital status: Single    Spouse name: Not on file  . Number of children: 1  . Years of education: Not on file  . Highest education level: Not on file  Occupational History  . Occupation: retired  Scientific laboratory technician  . Financial resource strain: Not on file  . Food insecurity    Worry: Not on file    Inability: Not on file  . Transportation needs    Medical: Not on file    Non-medical: Not on file  Tobacco Use  . Smoking status: Former Smoker    Quit date: 03/07/2016    Years since quitting: 3.0  . Smokeless tobacco: Never Used  Substance and Sexual Activity  . Alcohol use: Yes    Comment: rare  . Drug use: No  . Sexual activity: Not on file  Lifestyle  . Physical activity    Days per week: Not on file    Minutes per session: Not on file  . Stress: Not on file  Relationships  . Social Herbalist on phone: Not on file    Gets together: Not on file    Attends religious service: Not on file    Active  member of club or organization: Not on file    Attends meetings of clubs or organizations: Not on file    Relationship status: Not on file  . Intimate partner violence    Fear of current or ex partner: Not on file    Emotionally abused: Not on file    Physically abused: Not on file    Forced sexual activity: Not on file  Other Topics Concern  . Not on file  Social History Narrative  . Not on file    Family History:    Family History  Problem Relation Age of Onset  . CAD Maternal Grandfather   . Diabetes Other   . Diabetes Mellitus II Neg Hx   . Colon cancer Neg Hx   . Esophageal cancer Neg Hx   . Inflammatory bowel disease Neg Hx   . Liver disease Neg Hx   . Pancreatic cancer Neg Hx   . Rectal cancer Neg Hx   . Stomach cancer Neg Hx      ROS:  Please see the history of present illness.  General:no colds or fevers, no weight changes Skin:no rashes or ulcers HEENT:no blurred vision, no congestion CV:see HPI PUL:see HPI GI:no diarrhea constipation or melena, no indigestion GU:no hematuria, no dysuria MS:no joint pain, no claudication Neuro:no syncope, no lightheadedness Endo:+ diabetes- not well controlled, no thyroid disease  All other ROS reviewed and negative.  Physical Exam/Data:   Vitals:   03/08/19 0205 03/08/19 0345 03/08/19 0738 03/08/19 0918  BP: (!) 107/58 (!) 120/91 121/83   Pulse: 83 96 90 84  Resp: '13 14 16 17  '$ Temp: 97.9 F (36.6 C) 97.9 F (36.6 C)  97.8 F (36.6 C)  TempSrc: Oral   Oral  SpO2: 96% 92% 92% 94%  Weight:  (!) 180.5 kg    Height:        Intake/Output Summary (Last 24 hours) at 03/08/2019 1127 Last data filed at 03/08/2019 0932 Gross per 24 hour  Intake 500 ml  Output 1250 ml  Net -750 ml   Last 3 Weights 03/08/2019 03/07/2019 02/28/2019  Weight (lbs) 397 lb 14.9 oz 400 lb 401 lb  Weight (kg) 180.5 kg 181.439 kg 181.892 kg     Body mass index is 52.5 kg/m.  General:  Well nourished, well developed, in no acute distress   HEENT: normal Lymph: no adenopathy Neck: no JVD Endocrine:  No thryomegaly Vascular: No carotid bruits; pedal pulses 2+ bilaterally   Cardiac:  normal S1, S2; RRR; no murmur gallup rub or click Lungs:  clear to auscultation bilaterally, no wheezing, rhonchi or rales  Abd: obese, soft, nontender, no hepatomegaly + BS Ext: tr edema Lt with erythema -  Musculoskeletal:  No deformities, BUE and BLE strength normal and equal Skin: warm and dry  Neuro:  CNs 2-12 intact, no focal abnormalities noted Psych:  Normal affect    Relevant CV Studies: Echo 01/15/18  Study Conclusions  - Left ventricle: The cavity size was normal. Systolic function was   normal. The estimated ejection fraction was in the range of 60%   to 65%. Wall motion was normal; there were no regional wall   motion abnormalities. Features are consistent with a pseudonormal   left ventricular filling pattern, with concomitant abnormal   relaxation and increased filling pressure (grade 2 diastolic   dysfunction). Doppler parameters are consistent with elevated   ventricular end-diastolic filling pressure. - Aortic valve: Transvalvular velocity was minimally increased.   There was no stenosis. There was no regurgitation. - Aortic root: The aortic root was normal in size. - Mitral valve: There was no regurgitation. - Right ventricle: Systolic function was normal. - Tricuspid valve: There was no regurgitation. - Pulmonic valve: There was no regurgitation. - Pulmonary arteries: Systolic pressure could not be accurately   estimated. - Inferior vena cava: The vessel was normal in size. - Pericardium, extracardiac: There was no pericardial effusion.  Cardiac Cath 01/17/18  There is hyperdynamic left ventricular systolic function.  LV end diastolic pressure is normal.  The left ventricular ejection fraction is greater than 65% by visual estimate.  There is no mitral valve regurgitation.  There is no aortic valve  stenosis.  Prox LAD to Mid LAD lesion is 40% stenosed.  Prox Cx to Mid Cx lesion is 20% stenosed.  Ost 2nd Mrg lesion is 20% stenosed.   Angiographically minimal coronary disease with ectatic RCA and circumflex. Normal LVEDP and normal LV function/hyperdynamic.   Plan: Return to nursing unit for post catheterization care & TR band removal. Would be okay for discharge from cardiac standpoint after bedrest post TR band removal.  Defer further plans to primary team and consulting cardiologist.  Echo pending  zio patch for 7 days pending  Laboratory Data:  High Sensitivity Troponin:   Recent Labs  Lab 02/19/19 1832 02/19/19 1952 03/07/19 1541 03/07/19 1736  TROPONINIHS '5 4 6 '$ 6  Cardiac EnzymesNo results for input(s): TROPONINI in the last 168 hours. No results for input(s): TROPIPOC in the last 168 hours.  Chemistry Recent Labs  Lab 03/07/19 1541  NA 133*  K 3.9  CL 95*  CO2 25  GLUCOSE 176*  BUN 27*  CREATININE 1.05  CALCIUM 8.9  GFRNONAA >60  GFRAA >60  ANIONGAP 13    No results for input(s): PROT, ALBUMIN, AST, ALT, ALKPHOS, BILITOT in the last 168 hours. Hematology Recent Labs  Lab 03/07/19 1541 03/08/19 0226  WBC 15.3* 11.5*  RBC 4.43 4.47  HGB 12.9* 12.8*  HCT 39.4 39.5  MCV 88.9 88.4  MCH 29.1 28.6  MCHC 32.7 32.4  RDW 14.0 14.2  PLT 295 309   BNPNo results for input(s): BNP, PROBNP in the last 168 hours.  DDimer  Recent Labs  Lab 03/07/19 2351  DDIMER 0.69*     Radiology/Studies:  Dg Chest 2 View  Result Date: 03/07/2019 CLINICAL DATA:  Chest pain EXAM: CHEST - 2 VIEW COMPARISON:  February 19, 2019 FINDINGS: There is mild cardiomegaly. No focal airspace consolidation or pleural effusion. Degenerative changes seen in the midthoracic spine. Anterior flowing osteophytes seen. No acute osseous findings. IMPRESSION: No acute cardiopulmonary disease.  Mild cardiomegaly Electronically Signed   By: Prudencio Pair M.D.   On: 03/07/2019 16:19     Assessment and Plan:   1. Tachycardia Sinus tach on admit  may be inappropriate ST  He does had has had brief of 5 beats SVT on tele.  He was walking when this episode occurred.  Thyroid is stable. Zio patch we will expedite  2. Chronic atypical chest pain for years and minimal CAD on cath 1 year ago.torponin neg, this is not ACS but his atypical chest pain.   3. Morbid obesity which may be cause of issue with deconditioning.  Will have him walk in unit eval HR and oxygen sat. When able.  4. OSA on BiPap. 5. Cellulitis of lower ext.  With elevated WBC -ABX added.  6. Chronic venous stasis - lasix on hold  7. DM-2 per IM 8. Mildly elevated ddimer 9.  QTC prolongation on admit but prolonged on premature beats, second EKG 451 ms//469 ms 02/19/19 10. PTSD per IM 11. DNR       For questions or updates, please contact [place] Please consult [email or web address] for contact info under     Signed, Cecilie Kicks, NP  03/08/2019 11:27 AM    Patient seen and examined. Agree with assessment and plan.  Lawrence [name] is a 43-year-old gentleman who has a history of super morbid obesity, hypertension, lower extremity edema, obstructive sleep apnea on BiPAP therapy, depression, diastolic heart failure, who has experienced 3 to 4-week history of intermittent palpitations.  Most of these episodes have lasted from 10 to 15 minutes.  He had worn a ZIO patch which he had recently sent back in for review.  Yesterday while no longer wearing the patch he experienced an episode of tachycardia which lasted slightly over an hour.  Upon presenting to the emergency room heart rate was 135 bpm.  ECG showed possible sinus tachycardia versus SVT (difficult to discern distinct P waves).  Subsequently, his rhythm has been definitely sinus rhythm with heart rate improving to the 80s.  On a subsequent ECG, while clearly in sinus rhythm there did appear to be a 3 beat salvo of SVT/PAT.  Patient states most of these episodes  have occurred typically during the daytime and are  not exclusively nocturnal.  He is felt also to have possible lower extremity cellulitis was started on antibiotic therapy.  He denies any chest tightness or anginal type symptomatology and remotely had undergone cardiac catheterization in June 2019 which showed minimal nonobstructive CAD.   At present, when I arrived in the room he was sleeping on his back with his mouth open and snoring loudly.  BP stable with systolic pressure around 943.  HEENT revealed him to be normocephalic and atraumatic.  Mallampati scale is a 4.  He has very thick neck.  Lungs were clear without wheezing.  There was no chest wall tenderness.  Rhythm was regular with a 2/6 systolic murmur in the aortic area suggestive of possible mild aortic stenosis; no S3 gallop.  He had significant central adiposity.  There was 1+ lower extremity edema with erythema suggesting cellulitis.  High-sensitivity troponins are negative.  I long discussion with the patient.   Echo Doppler study was done today results have now just become available and demonstrate normal LV function with systolic ejection fraction 55 to 60%.  There was grade 2 diastolic dysfunction with pseudonormalization pattern.  There was mild biatrial enlargement, moderate to severe mitral annular calcification, moderate calcification of the aortic valve with mild aortic valve stenosis.  Peak aortic gradient was 32.5 with a mean gradient of 18.0.  At present, will initiate beta-blocker therapy with metoprolol succinate at 50 mg daily.  Recommend institution of BiPAP while he is hospitalized.  He does not know his home BiPAP pressures. (followed at [facility]).  As result I will initiate BiPAP auto pressures with an EPAP minimum of 8, pressure support of 4, and initial IPAP of 12 with maximal IPAP up to 25 cm of water while he is hospitalized.  He is undergoing antibiotic therapy for his cellulitis.  D-dimer is mildly positive and with  underlying cellulitis consider possible lower extremity venous Doppler imaging to assess for possible DVT.  Troy Sine, MD, [facility] 03/08/2019 1:47 PM

## 2019-03-08 NOTE — Progress Notes (Signed)
Monitor results as outpt will not be back until Monday or Tuesday.

## 2019-03-08 NOTE — Progress Notes (Addendum)
PROGRESS NOTE    Lawrence [name]  GXQ:[id number] DOB: 1955-10-28 DOA: 03/07/2019 PCP: [facility]   Brief Narrative:  Patient is a 63 year old male with history of anemia, anxiety, asthma, chronic diastolic CHF, COPD, depression, coronary artery disease, hypertension, type 2 diabetes mellitus, sleep apnea who presents the emergency department for the evaluation of left-sided chest pain and heart palpitations.  Patient follows at [place] [facility] for palpitations.  He was admitted for Further work-up.  Cardiology consulted.  Assessment & Plan:   Principal Problem:   Chest pain Active Problems:   Bilateral lower leg cellulitis   Sinus tachycardia   Chronic venous stasis   QT prolongation   Chest pain: Complained of chest pain this morning.  His chest pain is atypical.  Troponins negative.  EKG not suggestive of ischemic changes. Patient had undergone cardiac cath in June 2019 which showed minimal obstructive CAD.  Sinus tachycardia: Heart rate fluctuating between 140s to 90.  Chest x-ray showed mild cardiomegaly with no acute cardiopulmonary disease.  Patient was seen in [facility] on 7/23 for similar complaints and 7 day ZIO patch was ordered. Cardiology consulted today.  Started on metoprolol. Echocardiogram shows ejection fraction of 55 to 60%, moderate left ventricular hypertrophy.  Bilateral lower extremity cellulitis/swelling: Presented with leukocytosis.  Afebrile.  Continue cefazolin.  Patient also has bilateral chronic venous stasis changes.  Will check venous Doppler to rule out DVT  OSA: Continue bipap at night.  He is on BiPAP at home  Diabetes type 2: Continue sliding-scale insulin.HbA1C of 9.4  Morbid obesity: BMI of 52  Patient needs close monitoring and further work-up.Outpatient workup not sufficient  Cardiology following.  Changed to inpatient.           DVT prophylaxis: Lovenox Code Status: DNR Family Communication: Discussed with the patient  Disposition Plan: Likely home tomorrow   Consultants: Cardiology  Procedures: Echocardiogram  Antimicrobials:  Anti-infectives (From admission, onward)   Start     Dose/Rate Route Frequency Ordered Stop   03/07/19 2215  ceFAZolin (ANCEF) IVPB 2g/100 mL premix     2 g 200 mL/hr over 30 Minutes Intravenous Every 8 hours 03/07/19 2201        Subjective: Patient seen and examined the bedside this morning.  Currently hemodynamically stable.  Heart rate in the range of 90-100.  He was complaining of left-sided chest pain.  He appeared comfortable though  Objective: Vitals:   03/08/19 0205 03/08/19 0345 03/08/19 0738 03/08/19 0918  BP: (!) 107/58 (!) 120/91 121/83   Pulse: 83 96 90 84  Resp: 13 14 16 17   Temp: 97.9 F (36.6 C) 97.9 F (36.6 C)  97.8 F (36.6 C)  TempSrc: Oral   Oral  SpO2: 96% 92% 92% 94%  Weight:  (!) 180.5 kg    Height:        Intake/Output Summary (Last 24 hours) at 03/08/2019 1405 Last data filed at 03/08/2019 1204 Gross per 24 hour  Intake 980 ml  Output 1250 ml  Net -270 ml   Filed Weights   03/07/19 1544 03/08/19 0345  Weight: (!) 181.4 kg (!) 180.5 kg    Examination:  General exam: Not in distress,morbidly obese HEENT:PERRL,Oral mucosa moist, Ear/Nose normal on gross exam Respiratory system: Bilateral equal air entry, normal vesicular breath sounds, no wheezes or crackles  Cardiovascular system: S1 & S2 heard, RRR. No JVD, murmurs, rubs, gallops or clicks. No pedal edema. Gastrointestinal system: Abdomen is nondistended, soft and nontender. No organomegaly  or masses felt. Normal bowel sounds heard. Central nervous system: Alert and oriented. No focal neurological deficits. Extremities: No edema, no clubbing ,no cyanosis, distal peripheral pulses palpable. Skin: No rashes, lesions or ulcers,no icterus ,no pallor     Data Reviewed: I have personally reviewed following labs and imaging studies  CBC: Recent Labs  Lab 03/07/19 1541  03/08/19 0226  WBC 15.3* 11.5*  HGB 12.9* 12.8*  HCT 39.4 39.5  MCV 88.9 88.4  PLT 295 468   Basic Metabolic Panel: Recent Labs  Lab 03/07/19 1541 03/07/19 2351  NA 133*  --   K 3.9  --   CL 95*  --   CO2 25  --   GLUCOSE 176*  --   BUN 27*  --   CREATININE 1.05  --   CALCIUM 8.9  --   MG  --  1.8   GFR: Estimated Creatinine Clearance: 125.5 mL/min (by C-G formula based on SCr of 1.05 mg/dL). Liver Function Tests: No results for input(s): AST, ALT, ALKPHOS, BILITOT, PROT, ALBUMIN in the last 168 hours. No results for input(s): LIPASE, AMYLASE in the last 168 hours. No results for input(s): AMMONIA in the last 168 hours. Coagulation Profile: No results for input(s): INR, PROTIME in the last 168 hours. Cardiac Enzymes: No results for input(s): CKTOTAL, CKMB, CKMBINDEX, TROPONINI in the last 168 hours. BNP (last 3 results) No results for input(s): PROBNP in the last 8760 hours. HbA1C: Recent Labs    03/08/19 0226  HGBA1C 9.4*   CBG: Recent Labs  Lab 03/07/19 2139 03/08/19 0614 03/08/19 1108  GLUCAP 139* 179* 209*   Lipid Profile: No results for input(s): CHOL, HDL, LDLCALC, TRIG, CHOLHDL, LDLDIRECT in the last 72 hours. Thyroid Function Tests: Recent Labs    03/07/19 2351  TSH 1.648  FREET4 0.94   Anemia Panel: No results for input(s): VITAMINB12, FOLATE, FERRITIN, TIBC, IRON, RETICCTPCT in the last 72 hours. Sepsis Labs: Recent Labs  Lab 03/07/19 2351  LATICACIDVEN 1.4    Recent Results (from the past 240 hour(s))  SARS Coronavirus 2 (CEPHEID - Performed in [facility] hospital lab), Hosp Order     Status: None   Collection Time: 03/07/19  9:05 PM   Specimen: Nasopharyngeal Swab  Result Value Ref Range Status   SARS Coronavirus 2 NEGATIVE NEGATIVE Final    Comment: (NOTE) If result is NEGATIVE SARS-CoV-2 target nucleic acids are NOT DETECTED. The SARS-CoV-2 RNA is generally detectable in upper and lower  respiratory specimens during the acute  phase of infection. The lowest  concentration of SARS-CoV-2 viral copies this assay can detect is 250  copies / mL. A negative result does not preclude SARS-CoV-2 infection  and should not be used as the sole basis for treatment or other  patient management decisions.  A negative result may occur with  improper specimen collection / handling, submission of specimen other  than nasopharyngeal swab, presence of viral mutation(s) within the  areas targeted by this assay, and inadequate number of viral copies  (<250 copies / mL). A negative result must be combined with clinical  observations, patient history, and epidemiological information. If result is POSITIVE SARS-CoV-2 target nucleic acids are DETECTED. The SARS-CoV-2 RNA is generally detectable in upper and lower  respiratory specimens dur ing the acute phase of infection.  Positive  results are indicative of active infection with SARS-CoV-2.  Clinical  correlation with patient history and other diagnostic information is  necessary to determine patient infection status.  Positive  results do  not rule out bacterial infection or co-infection with other viruses. If result is PRESUMPTIVE POSTIVE SARS-CoV-2 nucleic acids MAY BE PRESENT.   A presumptive positive result was obtained on the submitted specimen  and confirmed on repeat testing.  While 2019 novel coronavirus  (SARS-CoV-2) nucleic acids may be present in the submitted sample  additional confirmatory testing may be necessary for epidemiological  and / or clinical management purposes  to differentiate between  SARS-CoV-2 and other Sarbecovirus currently known to infect humans.  If clinically indicated additional testing with an alternate test  methodology [phone]) is advised. The SARS-CoV-2 RNA is generally  detectable in upper and lower respiratory sp ecimens during the acute  phase of infection. The expected result is Negative. Fact Sheet for Patients:   StrictlyIdeas.no Fact Sheet for Healthcare Providers: BankingDealers.co.za This test is not yet approved or cleared by the [place] FDA and has been authorized for detection and/or diagnosis of SARS-CoV-2 by FDA under an Emergency Use Authorization (EUA).  This EUA will remain in effect (meaning this test can be used) for the duration of the COVID-19 declaration under Section 564(b)(1) of the Act, 21 U.S.C. section 360bbb-3(b)(1), unless the authorization is terminated or revoked sooner. Performed at [facility], [place] [address] [place], [place] [address]   MRSA PCR Screening     Status: None   Collection Time: 03/08/19  5:12 AM   Specimen: Nasal Mucosa; Nasopharyngeal  Result Value Ref Range Status   MRSA by PCR NEGATIVE NEGATIVE Final    Comment:        The GeneXpert MRSA Assay (FDA approved for NASAL specimens only), is one component of a comprehensive MRSA colonization surveillance program. It is not intended to diagnose MRSA infection nor to guide or monitor treatment for MRSA infections. Performed at [facility], [place] [address] [place], [place] [address]          Radiology Studies: Dg Chest 2 View  Result Date: 03/07/2019 CLINICAL DATA:  Chest pain EXAM: CHEST - 2 VIEW COMPARISON:  February 19, 2019 FINDINGS: There is mild cardiomegaly. No focal airspace consolidation or pleural effusion. Degenerative changes seen in the midthoracic spine. Anterior flowing osteophytes seen. No acute osseous findings. IMPRESSION: No acute cardiopulmonary disease.  Mild cardiomegaly Electronically Signed   By: Prudencio Pair M.D.   On: 03/07/2019 16:19        Scheduled Meds: . allopurinol  100 mg Oral Daily  . ARIPiprazole  5 mg Oral QHS  . aspirin EC  81 mg Oral Daily  . atorvastatin  40 mg Oral q1800  . enoxaparin (LOVENOX) injection  90 mg Subcutaneous Daily  . ferrous sulfate  325 mg Oral Q breakfast  .  fluticasone  1 spray Each Nare Daily  . insulin aspart  0-5 Units Subcutaneous QHS  . insulin aspart  0-9 Units Subcutaneous TID WC  . loratadine  10 mg Oral Daily  . mouth rinse  15 mL Mouth Rinse BID  . metoprolol succinate  50 mg Oral Daily  . pregabalin  75 mg Oral BID  . temazepam  15 mg Oral QHS  . traZODone  200 mg Oral QHS  . venlafaxine XR  150 mg Oral Q breakfast   Continuous Infusions: .  ceFAZolin (ANCEF) IV 2 g (03/08/19 1344)     LOS: 0 days    Time spent: 35 mins.More than 50% of that time was spent in counseling and/or coordination of care.  Shelly Coss, MD [facility] Pager [phone]  If 7PM-7AM, please contact night-coverage [email or web address] Password [email or web address] 03/08/2019, 2:05 PM

## 2019-03-08 NOTE — Progress Notes (Signed)
Echocardiogram 2D Echocardiogram has been performed.  Oneal Deputy Meilech Virts 03/08/2019, 9:07 AM

## 2019-03-09 LAB — GLUCOSE, CAPILLARY
Glucose-Capillary: 206 mg/dL — ABNORMAL HIGH (ref 70–99)
Glucose-Capillary: 200 mg/dL — ABNORMAL HIGH (ref 70–99)

## 2019-03-09 MED ORDER — METOPROLOL SUCCINATE ER 50 MG PO TB24: 50.0000 mg | ORAL_TABLET | Freq: Every day | ORAL | 1 refills | Status: DC

## 2019-03-09 MED ORDER — GLIPIZIDE 5 MG PO TABS: 5.0000 mg | ORAL_TABLET | Freq: Two times a day (BID) | ORAL | 0 refills | Status: DC

## 2019-03-09 MED ORDER — CEPHALEXIN 500 MG PO CAPS: 500.0000 mg | ORAL_CAPSULE | Freq: Three times a day (TID) | ORAL | Status: DC

## 2019-03-09 MED ORDER — CEPHALEXIN 500 MG PO CAPS: 500.0000 mg | ORAL_CAPSULE | Freq: Three times a day (TID) | ORAL | 0 refills | Status: DC

## 2019-03-09 MED ORDER — CEPHALEXIN 500 MG PO CAPS: 500.0000 mg | ORAL_CAPSULE | Freq: Three times a day (TID) | ORAL | 0 refills | Status: AC

## 2019-03-09 NOTE — Plan of Care (Signed)

## 2019-03-09 NOTE — Progress Notes (Signed)
Discharged home by ambulance([facility]), discharge instructions given to pt. Belongings taken home.

## 2019-03-09 NOTE — Discharge Summary (Signed)
Physician Discharge Summary  Lawrence [name] YYT:[id number] DOB: 03/07/1956 DOA: 03/07/2019  PCP: [facility]  Admit date: 03/07/2019 Discharge date: 03/09/2019  Admitted From: Home Disposition:  Home  Discharge Condition:Stable CODE STATUS:FULL Diet recommendation: Heart Healthy  Brief/Interim Summary:  Patient is a 63 year old male with history of anemia, anxiety, asthma, chronic diastolic CHF, COPD, depression, coronary artery disease, hypertension, type 2 diabetes mellitus, sleep apnea who presents the emergency department for the evaluation of left-sided chest pain and heart palpitations.  Patient follows at [facility] for palpitations.  He was admitted for Further work-up.  Cardiology consulted and he started on metoprolol.  Currently his heart rate is stable.  He was also started on IV antibiotic for suspected bilateral lower extremity cellulitis.  Antibiotics has been changed to oral today.  He is hemodynamically stable for discharge today.  He will follow-up with cardiology as an outpatient.  Following problems were addressed during his hospitalization;  Chest pain: His chest pain is atypical.  Troponins negative.  EKG not suggestive of ischemic changes.Improved with motrin. Patient had undergone cardiac cath in June 2019 which showed minimal obstructive CAD.  Sinus tachycardia:   Chest x-ray showed mild cardiomegaly with no acute cardiopulmonary disease.  Patient was seen in [facility] on 7/23 for similar complaints and 7 day ZIO patch was ordered. Cardiology consulted .  Started on metoprolol. Echocardiogram shows ejection fraction of 55 to 60%, moderate left ventricular hypertrophy. HR well controlled this morning.  Bilateral lower extremity cellulitis/swelling: Presented with leukocytosis.  Afebrile. Started on  cefazolin.  Patient also has bilateral chronic venous stasis changes.    He just had bilateral lower extremity Doppler a month ago and it was  normal.antibiotics changed to oral   OSA:   He is on BiPAP at home  Diabetes type 2:  On metformin at home.  Added glipizide..HbA1C of 9.4  Morbid obesity: BMI of 52  Discharge Diagnoses:  Principal Problem:   Chest pain Active Problems:   Bilateral lower leg cellulitis   Sinus tachycardia   Chronic venous stasis   QT prolongation    Discharge Instructions  Discharge Instructions    Diet - low sodium heart healthy   Complete by: As directed    Discharge instructions   Complete by: As directed    1) Take prescribed medications as instructed. 2)Follow up with your PCP in a week.Do a CBC test during the follow up. Check HbA1C in 3 month. 3)Follow up with your cardiologist in 2 weeks. 4) Your diabetes is uncontrolled.  Please monitor your blood sugars at home.    We have added another medication for your diabetes.  Follow-up closely with your PCP for the management of your diabetes.   Increase activity slowly   Complete by: As directed      Allergies as of 03/09/2019      Reactions   Tubersol [tuberculin] Other (See Comments)   Reaction unknown   Vancomycin Other (See Comments)   Red Man Syndrome   Doxycycline Rash      Medication List    TAKE these medications   acetaminophen 500 MG tablet Commonly known as: TYLENOL Take 500 mg by mouth 4 (four) times daily as needed for mild pain.   allopurinol 100 MG tablet Commonly known as: ZYLOPRIM Take 1 tablet (100 mg total) by mouth daily.   ARIPiprazole 5 MG tablet Commonly known as: ABILIFY Take 5 mg by mouth at bedtime.   ASPERCREME EX Apply 1 application topically  2 (two) times daily as needed (pain and inflammation).   aspirin 81 MG EC tablet Take 1 tablet (81 mg total) by mouth daily.   atorvastatin 40 MG tablet Commonly known as: LIPITOR Take 1 tablet (40 mg total) by mouth daily at 6 PM.   blood glucose meter kit and supplies Dispense based on patient and insurance preference. Use up to four times  daily as directed. (FOR ICD-10 E10.9, E11.9).   cephALEXin 500 MG capsule Commonly known as: KEFLEX Take 1 capsule (500 mg total) by mouth every 8 (eight) hours for 5 days.   cetirizine 10 MG tablet Commonly known as: ZYRTEC Take 10 mg by mouth daily.   ferrous sulfate 324 (65 Fe) MG Tbec Take 1 tablet by mouth daily.   fluticasone 50 MCG/ACT nasal spray Commonly known as: FLONASE Place 1 spray into both nostrils daily.   furosemide 80 MG tablet Commonly known as: LASIX Take 80 mg by mouth 2 (two) times daily.   glipiZIDE 5 MG tablet Commonly known as: GLUCOTROL Take 1 tablet (5 mg total) by mouth 2 (two) times daily.   lisinopril 10 MG tablet Commonly known as: ZESTRIL Take 10 mg by mouth daily.   loperamide 2 MG capsule Commonly known as: IMODIUM Take 2 mg by mouth 2 (two) times a day.   meloxicam 7.5 MG tablet Commonly known as: MOBIC Take 7.5 mg by mouth daily as needed for pain.   metFORMIN 1000 MG tablet Commonly known as: GLUCOPHAGE Take 1,000 mg by mouth 2 (two) times daily with a meal.   metoprolol succinate 50 MG 24 hr tablet Commonly known as: TOPROL-XL Take 1 tablet (50 mg total) by mouth daily. Take with or immediately following a meal. Start taking on: March 10, 2019   prazosin 2 MG capsule Commonly known as: MINIPRESS Take 4 mg by mouth at bedtime.   pregabalin 75 MG capsule Commonly known as: LYRICA Take 75 mg by mouth 2 (two) times daily.   silver sulfADIAZINE 1 % cream Commonly known as: SILVADENE Apply 1 application topically daily. Una boot (both legs)   temazepam 15 MG capsule Commonly known as: RESTORIL Take 15 mg by mouth at bedtime.   traZODone 100 MG tablet Commonly known as: DESYREL Take 200 mg by mouth at bedtime.   venlafaxine XR 150 MG 24 hr capsule Commonly known as: EFFEXOR-XR Take 150 mg by mouth daily with breakfast.      [facility]. Schedule an appointment as soon as possible  for a visit in 1 week(s).   Specialty: General Practice Contact information: [place] [phone]        Sueanne Margarita, MD. Schedule an appointment as soon as possible for a visit in 2 week(s).   Specialty: Cardiology Contact information: [address] [place] [address] [phone]          Allergies  Allergen Reactions  . Tubersol [Tuberculin] Other (See Comments)    Reaction unknown  . Vancomycin Other (See Comments)    Red Man Syndrome  . Doxycycline Rash    Consultations:  Cardiology   Procedures/Studies: Dg Chest 2 View  Result Date: 03/07/2019 CLINICAL DATA:  Chest pain EXAM: CHEST - 2 VIEW COMPARISON:  February 19, 2019 FINDINGS: There is mild cardiomegaly. No focal airspace consolidation or pleural effusion. Degenerative changes seen in the midthoracic spine. Anterior flowing osteophytes seen. No acute osseous findings. IMPRESSION: No acute cardiopulmonary disease.  Mild cardiomegaly Electronically Signed   By: Prudencio Pair M.D.   On: 03/07/2019 16:19   Dg Chest Portable 1 View  Result Date: 02/19/2019 CLINICAL DATA:  Chest pain EXAM: PORTABLE CHEST 1 VIEW COMPARISON:  08/17/2018 FINDINGS: Cardiomegaly. Hyperinflation of the lungs. No confluent opacities or effusions. No acute bony abnormality. IMPRESSION: Cardiomegaly, hyperinflation.  No active disease. Electronically Signed   By: Rolm Baptise M.D.   On: 02/19/2019 18:39      Subjective:  Patient seen and examined the bedside this morning.  Hemodynamically stable for discharge.  Heart rate well controlled Discharge Exam: Vitals:   03/09/19 1100 03/09/19 1140  BP: 123/73   Pulse: 70   Resp: 12   Temp: 98.2 F (36.8 C) 98.7 F (37.1 C)  SpO2: 92%    Vitals:   03/09/19 0750 03/09/19 0752 03/09/19 1100 03/09/19 1140  BP: (!) 143/82  123/73   Pulse: 70  70   Resp: (!) 21  12   Temp: 98.4 F (36.9 C) 98.4 F (36.9 C) 98.2 F (36.8 C) 98.7 F  (37.1 C)  TempSrc: Oral  Oral Oral  SpO2: 90%  92%   Weight:      Height:        General: Pt is alert, awake, not in acute distress, obese Cardiovascular: RRR, S1/S2 +, no rubs, no gallops Respiratory: CTA bilaterally, no wheezing, no rhonchi Abdominal: Soft, NT, ND, bowel sounds + Extremities: no edema, no cyanosis    The results of significant diagnostics from this hospitalization (including imaging, microbiology, ancillary and laboratory) are listed below for reference.     Microbiology: Recent Results (from the past 240 hour(s))  SARS Coronavirus 2 (CEPHEID - Performed in [facility] hospital lab), Hosp Order     Status: None   Collection Time: 03/07/19  9:05 PM   Specimen: Nasopharyngeal Swab  Result Value Ref Range Status   SARS Coronavirus 2 NEGATIVE NEGATIVE Final    Comment: (NOTE) If result is NEGATIVE SARS-CoV-2 target nucleic acids are NOT DETECTED. The SARS-CoV-2 RNA is generally detectable in upper and lower  respiratory specimens during the acute phase of infection. The lowest  concentration of SARS-CoV-2 viral copies this assay can detect is 250  copies / mL. A negative result does not preclude SARS-CoV-2 infection  and should not be used as the sole basis for treatment or other  patient management decisions.  A negative result may occur with  improper specimen collection / handling, submission of specimen other  than nasopharyngeal swab, presence of viral mutation(s) within the  areas targeted by this assay, and inadequate number of viral copies  (<250 copies / mL). A negative result must be combined with clinical  observations, patient history, and epidemiological information. If result is POSITIVE SARS-CoV-2 target nucleic acids are DETECTED. The SARS-CoV-2 RNA is generally detectable in upper and lower  respiratory specimens dur ing the acute phase of infection.  Positive  results are indicative of active infection with SARS-CoV-2.  Clinical   correlation with patient history and other diagnostic information is  necessary to determine patient infection status.  Positive results do  not rule out bacterial infection or co-infection with other viruses. If result is PRESUMPTIVE POSTIVE SARS-CoV-2 nucleic acids MAY BE PRESENT.   A presumptive positive result was obtained on the submitted specimen  and confirmed on repeat testing.  While 2019 novel coronavirus  (SARS-CoV-2) nucleic acids may be present in the submitted sample  additional confirmatory testing may be necessary for  epidemiological  and / or clinical management purposes  to differentiate between  SARS-CoV-2 and other Sarbecovirus currently known to infect humans.  If clinically indicated additional testing with an alternate test  methodology [phone]) is advised. The SARS-CoV-2 RNA is generally  detectable in upper and lower respiratory sp ecimens during the acute  phase of infection. The expected result is Negative. Fact Sheet for Patients:  StrictlyIdeas.no Fact Sheet for Healthcare Providers: BankingDealers.co.za This test is not yet approved or cleared by the [place] FDA and has been authorized for detection and/or diagnosis of SARS-CoV-2 by FDA under an Emergency Use Authorization (EUA).  This EUA will remain in effect (meaning this test can be used) for the duration of the COVID-19 declaration under Section 564(b)(1) of the Act, 21 U.S.C. section 360bbb-3(b)(1), unless the authorization is terminated or revoked sooner. Performed at [facility], [place] [address], [place], [place] [address]   MRSA PCR Screening     Status: None   Collection Time: 03/08/19  5:12 AM   Specimen: Nasal Mucosa; Nasopharyngeal  Result Value Ref Range Status   MRSA by PCR NEGATIVE NEGATIVE Final    Comment:        The GeneXpert MRSA Assay (FDA approved for NASAL specimens only), is one component of a comprehensive MRSA  colonization surveillance program. It is not intended to diagnose MRSA infection nor to guide or monitor treatment for MRSA infections. Performed at [facility], [place] [address], [place], [place] [address]      Labs: BNP (last 3 results) Recent Labs    08/17/18 1824 02/19/19 1753  BNP 49.8 53.9   Basic Metabolic Panel: Recent Labs  Lab 03/07/19 1541 03/07/19 2351  NA 133*  --   K 3.9  --   CL 95*  --   CO2 25  --   GLUCOSE 176*  --   BUN 27*  --   CREATININE 1.05  --   CALCIUM 8.9  --   MG  --  1.8   Liver Function Tests: No results for input(s): AST, ALT, ALKPHOS, BILITOT, PROT, ALBUMIN in the last 168 hours. No results for input(s): LIPASE, AMYLASE in the last 168 hours. No results for input(s): AMMONIA in the last 168 hours. CBC: Recent Labs  Lab 03/07/19 1541 03/08/19 0226  WBC 15.3* 11.5*  HGB 12.9* 12.8*  HCT 39.4 39.5  MCV 88.9 88.4  PLT 295 309   Cardiac Enzymes: No results for input(s): CKTOTAL, CKMB, CKMBINDEX, TROPONINI in the last 168 hours. BNP: Invalid input(s): POCBNP CBG: Recent Labs  Lab 03/08/19 0614 03/08/19 1108 03/08/19 1614 03/08/19 2147 03/09/19 0622  GLUCAP 179* 209* 182* 135* 206*   D-Dimer Recent Labs    03/07/19 2351  DDIMER 0.69*   Hgb A1c Recent Labs    03/08/19 0226  HGBA1C 9.4*   Lipid Profile No results for input(s): CHOL, HDL, LDLCALC, TRIG, CHOLHDL, LDLDIRECT in the last 72 hours. Thyroid function studies Recent Labs    03/07/19 2351  TSH 1.648   Anemia work up No results for input(s): VITAMINB12, FOLATE, FERRITIN, TIBC, IRON, RETICCTPCT in the last 72 hours. Urinalysis    Component Value Date/Time   COLORURINE RED (A) 02/06/2017 1215   APPEARANCEUR CLOUDY (A) 02/06/2017 1215   LABSPEC  02/06/2017 1215    TEST NOT REPORTED DUE TO COLOR INTERFERENCE OF URINE PIGMENT   PHURINE  02/06/2017 1215    TEST NOT REPORTED DUE TO COLOR INTERFERENCE OF URINE PIGMENT  GLUCOSEU (A) 02/06/2017 1215     TEST NOT REPORTED DUE TO COLOR INTERFERENCE OF URINE PIGMENT   HGBUR (A) 02/06/2017 1215    TEST NOT REPORTED DUE TO COLOR INTERFERENCE OF URINE PIGMENT   BILIRUBINUR (A) 02/06/2017 1215    TEST NOT REPORTED DUE TO COLOR INTERFERENCE OF URINE PIGMENT   KETONESUR (A) 02/06/2017 1215    TEST NOT REPORTED DUE TO COLOR INTERFERENCE OF URINE PIGMENT   PROTEINUR (A) 02/06/2017 1215    TEST NOT REPORTED DUE TO COLOR INTERFERENCE OF URINE PIGMENT   NITRITE (A) 02/06/2017 1215    TEST NOT REPORTED DUE TO COLOR INTERFERENCE OF URINE PIGMENT   LEUKOCYTESUR (A) 02/06/2017 1215    TEST NOT REPORTED DUE TO COLOR INTERFERENCE OF URINE PIGMENT   Sepsis Labs Invalid input(s): PROCALCITONIN,  WBC,  LACTICIDVEN Microbiology Recent Results (from the past 240 hour(s))  SARS Coronavirus 2 (CEPHEID - Performed in [place] hospital lab), Hosp Order     Status: None   Collection Time: 03/07/19  9:05 PM   Specimen: Nasopharyngeal Swab  Result Value Ref Range Status   SARS Coronavirus 2 NEGATIVE NEGATIVE Final    Comment: (NOTE) If result is NEGATIVE SARS-CoV-2 target nucleic acids are NOT DETECTED. The SARS-CoV-2 RNA is generally detectable in upper and lower  respiratory specimens during the acute phase of infection. The lowest  concentration of SARS-CoV-2 viral copies this assay can detect is 250  copies / mL. A negative result does not preclude SARS-CoV-2 infection  and should not be used as the sole basis for treatment or other  patient management decisions.  A negative result may occur with  improper specimen collection / handling, submission of specimen other  than nasopharyngeal swab, presence of viral mutation(s) within the  areas targeted by this assay, and inadequate number of viral copies  (<250 copies / mL). A negative result must be combined with clinical  observations, patient history, and epidemiological information. If result is POSITIVE SARS-CoV-2 target nucleic acids are  DETECTED. The SARS-CoV-2 RNA is generally detectable in upper and lower  respiratory specimens dur ing the acute phase of infection.  Positive  results are indicative of active infection with SARS-CoV-2.  Clinical  correlation with patient history and other diagnostic information is  necessary to determine patient infection status.  Positive results do  not rule out bacterial infection or co-infection with other viruses. If result is PRESUMPTIVE POSTIVE SARS-CoV-2 nucleic acids MAY BE PRESENT.   A presumptive positive result was obtained on the submitted specimen  and confirmed on repeat testing.  While 2019 novel coronavirus  (SARS-CoV-2) nucleic acids may be present in the submitted sample  additional confirmatory testing may be necessary for epidemiological  and / or clinical management purposes  to differentiate between  SARS-CoV-2 and other Sarbecovirus currently known to infect humans.  If clinically indicated additional testing with an alternate test  methodology [phone]) is advised. The SARS-CoV-2 RNA is generally  detectable in upper and lower respiratory sp ecimens during the acute  phase of infection. The expected result is Negative. Fact Sheet for Patients:  StrictlyIdeas.no Fact Sheet for Healthcare Providers: BankingDealers.co.za This test is not yet approved or cleared by the [place] FDA and has been authorized for detection and/or diagnosis of SARS-CoV-2 by FDA under an Emergency Use Authorization (EUA).  This EUA will remain in effect (meaning this test can be used) for the duration of the COVID-19 declaration under Section 564(b)(1) of the Act, 21 U.S.C. section  360bbb-3(b)(1), unless the authorization is terminated or revoked sooner. Performed at [facility], [place] [address], [place], [place] [address]   MRSA PCR Screening     Status: None   Collection Time: 03/08/19  5:12 AM   Specimen: Nasal Mucosa;  Nasopharyngeal  Result Value Ref Range Status   MRSA by PCR NEGATIVE NEGATIVE Final    Comment:        The GeneXpert MRSA Assay (FDA approved for NASAL specimens only), is one component of a comprehensive MRSA colonization surveillance program. It is not intended to diagnose MRSA infection nor to guide or monitor treatment for MRSA infections. Performed at [facility], [place] [address] [place],  [address]     Please note: You were cared for by a hospitalist during your hospital stay. Once you are discharged, your primary care physician will handle any further medical issues. Please note that NO REFILLS for any discharge medications will be authorized once you are discharged, as it is imperative that you return to your primary care physician (or establish a relationship with a primary care physician if you do not have one) for your post hospital discharge needs so that they can reassess your need for medications and monitor your lab values.    Time coordinating discharge: 40 minutes  SIGNED:   Shelly Coss, MD  [facility] 03/09/2019, 12:01 PM Pager [phone]  If 7PM-7AM, please contact night-coverage [email or web address] Password [email or web address]

## 2019-03-09 NOTE — TOC Transition Note (Signed)
Transition of Care [facility]) - CM/SW Discharge Note   Patient Details  Name: Lawrence [name] MRN: [id number] Date of Birth: [birth year]  [facility] CM/SW Contact:  Claudie Leach, RN Phone Number: 03/09/2019, 12:46 PM   Clinical Narrative:    Pt to d/c home with previous home health RN services through [facility]. Confirmed with Alwyn Ren that they are aware of pt d/c.    Patient requests ambulance to transport home.  [facility] called to transport patient at 1:30.  Medical Necessity forms on unit with RN.   Final next level of care: Home w Home Health Services Barriers to Discharge: No Barriers Identified   Patient Goals and CMS Choice   CMS Medicare.gov Compare Post Acute Care list provided to:: Patient Choice offered to / list presented to : Patient   Discharge Plan and Services      HH Arranged: RN [facility] Agency: [facility] (formerly [employer]) Date [place]: 03/09/19 Time [place]: 11 Representative spoke with at [place]: [place]

## 2019-03-13 ENCOUNTER — Other Ambulatory Visit (HOSPITAL_COMMUNITY): Payer: Self-pay | Admitting: *Deleted

## 2019-03-13 DIAGNOSIS — I471 Supraventricular tachycardia, unspecified: Secondary | ICD-10-CM

## 2019-03-20 ENCOUNTER — Ambulatory Visit (INDEPENDENT_AMBULATORY_CARE_PROVIDER_SITE_OTHER): Payer: No Typology Code available for payment source | Admitting: Cardiology

## 2019-03-20 ENCOUNTER — Encounter: Payer: Self-pay | Admitting: Cardiology

## 2019-03-20 ENCOUNTER — Other Ambulatory Visit: Payer: Self-pay

## 2019-03-20 VITALS — BP 112/68 | HR 81 | Ht 74.0 in | Wt >= 6400 oz

## 2019-03-20 DIAGNOSIS — I471 Supraventricular tachycardia, unspecified: Secondary | ICD-10-CM

## 2019-03-20 DIAGNOSIS — I25118 Atherosclerotic heart disease of native coronary artery with other forms of angina pectoris: Secondary | ICD-10-CM

## 2019-03-20 DIAGNOSIS — E785 Hyperlipidemia, unspecified: Secondary | ICD-10-CM

## 2019-03-20 DIAGNOSIS — E1169 Type 2 diabetes mellitus with other specified complication: Secondary | ICD-10-CM

## 2019-03-20 DIAGNOSIS — I10 Essential (primary) hypertension: Secondary | ICD-10-CM

## 2019-03-20 DIAGNOSIS — R002 Palpitations: Secondary | ICD-10-CM

## 2019-03-20 DIAGNOSIS — G4733 Obstructive sleep apnea (adult) (pediatric): Secondary | ICD-10-CM

## 2019-03-20 DIAGNOSIS — I350 Nonrheumatic aortic (valve) stenosis: Secondary | ICD-10-CM

## 2019-03-20 DIAGNOSIS — E669 Obesity, unspecified: Secondary | ICD-10-CM

## 2019-03-20 MED ORDER — PROPRANOLOL HCL 10 MG PO TABS: 10.0000 mg | ORAL_TABLET | ORAL | 0 refills | Status: DC | PRN

## 2019-03-20 NOTE — Progress Notes (Signed)
Cardiology Office Note:    Date:  03/20/2019   ID:  Lawrence [name], DOB 1956-04-04, MRN [id number]  PCP:  Lawrence [name]  Cardiologist:  Lawrence Him, MD  Referring MD: Lawrence [name]   Chief Complaint  Patient presents with   Follow-up    SVT    History of Present Illness:    Lawrence [name] is a 63 y.o. male with a past [name] history significant for nonobstructive CAD by cath 01/2018, HTN, HLD, DM, chronic diastolic CHF, OSA on CPAP, PTSD, super morbid obesity (BMI >50), gout, depression, anxiety, DNR.   The patient was initially seen in the hospital by Dr. Radford [name] in 01/2018 for evaluation of chest pain and shortness of breath.  It was felt that he was a poor candidate for ischemic evaluation given his morbid obesity as this precluded adequate assessment by coronary CTA or stress test. EKG was with no ischemic changes.  Troponins were negative.  The patient was taken for cardiac cath on 01/17/2018 with finding of only minimal CAD, normal LVEDP and normal LV function.  The patient was treated for CHF as well as aggressive risk factor modification.  The patient presented to the ED 02/19/2019 for complaints of palpitations and fast heartbeat with associated chest pressure.  There were no arrhythmias noted on telemetry while there.  He was arranged to be seen in the A. fib clinic.  On 02/28/2019 he was seen by Lawrence So, PA in the A. fib clinic.  A ZIO patch event monitor was ordered.  This did show multiple episodes of SVT and he is scheduled to see EP 04/25/2019.  Mr Lawrence [name] is here today with his wife. She helps answer some of the questions. She says that he has been having palpitations for many years. They became worse since February. Recently he has had 1-2 episodes per day with HR up to 140's and can briefly be up to 200. He uses his pulseoximeter or BP cuff to assess his HR. Sometimes he feels the fast heart beat, sometimes not. He gets lightheaded, sometimes chest pressure-not  always. Since he was placed on metoprolol in the hospital his episodes have decreased in frequency and intensity.  He has not had any episodes in the last 3 days.  [name] is morbidly obese.  His activity level is significantly impaired.  He uses a walker at home and a motorized chair when out.  He has chronic lower extremity edema.  He says that he is followed by kidney doctor at the [place] but has not been told that he has renal insufficiency.  He says he is followed due to his diabetes.  His A1c is high.  He and his wife describe a very healthy diet of mostly vegetables and low in carbs but they both have significant central obesity.  [name] uses BiPAP at night and verbalizes compliance even with naps.  Past [name] History:  Diagnosis Date   Adenomatous colon polyp 2007   Anemia    Anxiety    Arthritis    Asthma    Chronic cough    Chronic diastolic CHF (congestive heart failure) (HCC)    COPD (chronic obstructive pulmonary disease) ([place])    Depression    History of cardiac cath    a.  01/2018 with angiographically minimal coronary disease with  up to 40% prox LAD and 20% mid LCx and OM2 with normal LVF and normal LVEDP.[name]   Hypertension    Morbid obesity ([place])  New onset type 2 diabetes mellitus ([place]) 11/08/2017   PTSD (post-traumatic stress disorder)    Sleep apnea    CPAP    Past Surgical History:  Procedure Laterality Date   JOINT REPLACEMENT     left knee replacement x 2   KNEE ARTHROSCOPY Bilateral    LEFT HEART CATH AND CORONARY ANGIOGRAPHY N/A 01/17/2018   Procedure: LEFT HEART CATH AND CORONARY ANGIOGRAPHY;  Surgeon: Leonie Man, MD;  Location: [facility];  Service: Cardiovascular;  Laterality: N/A;   UMBILICAL HERNIA REPAIR      Current Medications: Current Meds  Medication Sig   acetaminophen (TYLENOL) 500 MG tablet Take 500 mg by mouth 4 (four) times daily as needed for mild pain.    allopurinol (ZYLOPRIM) 100 MG tablet Take 1  tablet (100 mg total) by mouth daily.   ARIPiprazole (ABILIFY) 5 MG tablet Take 5 mg by mouth at bedtime.    aspirin EC 81 MG EC tablet Take 1 tablet (81 mg total) by mouth daily.   atorvastatin (LIPITOR) 40 MG tablet Take 1 tablet (40 mg total) by mouth daily at 6 PM.   blood glucose meter kit and supplies Dispense based on patient and insurance preference. Use up to four times daily as directed. (FOR ICD-10 E10.9, E11.9).   cetirizine (ZYRTEC) 10 MG tablet Take 10 mg by mouth daily.   ferrous sulfate 324 (65 Fe) MG TBEC Take 1 tablet by mouth daily.    fluticasone (FLONASE) 50 MCG/ACT nasal spray Place 1 spray into both nostrils daily.    furosemide (LASIX) 80 MG tablet Take 80 mg by mouth 2 (two) times daily.   glipiZIDE (GLUCOTROL) 5 MG tablet Take 1 tablet (5 mg total) by mouth 2 (two) times daily.   lisinopril (PRINIVIL,ZESTRIL) 10 MG tablet Take 10 mg by mouth daily.   loperamide (IMODIUM) 2 MG capsule Take 2 mg by mouth 2 (two) times a day.    meloxicam (MOBIC) 7.5 MG tablet Take 7.5 mg by mouth daily as needed for pain.   metFORMIN (GLUCOPHAGE) 1000 MG tablet Take 1,000 mg by mouth 2 (two) times daily with a meal.   metoprolol succinate (TOPROL-XL) 50 MG 24 hr tablet Take 1 tablet (50 mg total) by mouth daily. Take with or immediately following a meal.   prazosin (MINIPRESS) 2 MG capsule Take 4 mg by mouth at bedtime.   pregabalin (LYRICA) 75 MG capsule Take 75 mg by mouth 2 (two) times daily.   silver sulfADIAZINE (SILVADENE) 1 % cream Apply 1 application topically daily. Una boot (both legs)   temazepam (RESTORIL) 15 MG capsule Take 15 mg by mouth at bedtime.    traZODone (DESYREL) 100 MG tablet Take 200 mg by mouth at bedtime.   Trolamine Salicylate (ASPERCREME EX) Apply 1 application topically 2 (two) times daily as needed (pain and inflammation).   venlafaxine XR (EFFEXOR-XR) 150 MG 24 hr capsule Take 150 mg by mouth daily with breakfast.     Allergies:    Tubersol [tuberculin], Vancomycin, and Doxycycline   Social History   Socioeconomic History   Marital status: Single    Spouse name: Not on file   Number of children: 1   Years of education: Not on file   Highest education level: Not on file  Occupational History   Occupation: retired  Scientist, product/process development strain: Not on file   Food insecurity    Worry: Not on file    Inability: Not on  file   Transportation needs    [name]: Not on file    Non-[name]: Not on file  Tobacco Use   Smoking status: Former Smoker    Quit date: 03/07/2016    Years since quitting: 3.0   Smokeless tobacco: Never Used  Substance and Sexual Activity   Alcohol use: Yes    Comment: rare   Drug use: No   Sexual activity: Not on file  Lifestyle   Physical activity    Days per week: Not on file    Minutes per session: Not on file   Stress: Not on file  Relationships   Social connections    Talks on phone: Not on file    Gets together: Not on file    Attends religious service: Not on file    Active member of club or organization: Not on file    Attends meetings of clubs or organizations: Not on file    Relationship status: Not on file  Other Topics Concern   Not on file  Social History Narrative   Not on file     Family History: The patient's family history includes CAD in his maternal grandfather; Diabetes in an other family member. There is no history of Diabetes Mellitus II, Colon cancer, Esophageal cancer, Inflammatory bowel disease, Liver disease, Pancreatic cancer, Rectal cancer, or Stomach cancer. ROS:   Please see the history of present illness.     All other systems reviewed and are negative.  EKGs/Labs/Other Studies Reviewed:    The following studies were reviewed today:  Cardiac event monitor 02/28/2019 Study Highlights  Max 200 bpm 09:28pm, 07/28 Min 66 bpm 08:09am, 07/28 Avg 92 bpm Multiple episodes of SVT Fastest episode at 200 BPM for 14  beats Longest episode at 15 minutes with average HR of 137 bpm Symptoms of chest pain associated with sinus rhythm Artifact makes interpretation difficult  Will Camnitz, MD     Echocardiogram 03/08/2019 IMPRESSIONS  1. The left ventricle has normal systolic function, with an ejection fraction of 55-60%. The cavity size was normal. There is moderate concentric left ventricular hypertrophy. Left ventricular diastolic Doppler parameters are consistent with  pseudonormalization. No evidence of left ventricular regional wall motion abnormalities.  2. The right ventricle has normal systolic function. The cavity was normal. There is no increase in right ventricular wall thickness.  3. Left atrial size was mildly dilated.  4. Right atrial size was mildly dilated.  5. Mild thickening of the mitral valve leaflet. Mild calcification of the mitral valve leaflet. There is moderate to severe mitral annular calcification present.  6. The aortic valve is tricuspid. Moderate thickening of the aortic valve. Moderate calcification of the aortic valve. Mild stenosis of the aortic valve.  7. The aorta is normal in size and structure.  8. The aortic root is normal in size and structure.  LEFT HEART CATH AND CORONARY ANGIOGRAPHY 01/17/2018  Conclusion    There is hyperdynamic left ventricular systolic function.  LV end diastolic pressure is normal.  The left ventricular ejection fraction is greater than 65% by visual estimate.  There is no mitral valve regurgitation.  There is no aortic valve stenosis.  Prox LAD to Mid LAD lesion is 40% stenosed.  Prox Cx to Mid Cx lesion is 20% stenosed.  Ost 2nd Mrg lesion is 20% stenosed.   Angiographically minimal coronary disease with ectatic RCA and circumflex. Normal LVEDP and normal LV function/hyperdynamic.  Plan: Return to nursing unit for post catheterization care & TR  band removal. Would be okay for discharge from cardiac standpoint after bedrest  post TR band removal.  Defer further plans to primary team and consulting cardiologist.  Glenetta Hew, M.D., M.S.    EKG:  EKG is not ordered today.   Recent Labs: 02/19/2019: ALT 26; B Natriuretic Peptide 38.6 03/07/2019: BUN 27; Creatinine, Ser 1.05; Magnesium 1.8; Potassium 3.9; Sodium 133; TSH 1.648 03/08/2019: Hemoglobin 12.8; Platelets 309   Recent Lipid Panel    Component Value Date/Time   CHOL 149 08/18/2018 0144   TRIG 174 (H) 08/18/2018 0144   HDL 36 (L) 08/18/2018 0144   CHOLHDL 4.1 08/18/2018 0144   VLDL 35 08/18/2018 0144   LDLCALC 78 08/18/2018 0144    Physical Exam:    VS:  BP 112/68    Pulse 81    Ht _0  (1.88 m)    Wt (!) 404 lb (183.3 kg)    SpO2 90%    BMI 51.87 kg/m     Wt Readings from Last 3 Encounters:  03/20/19 (!) 404 lb (183.3 kg)  03/08/19 (!) 397 lb 14.9 oz (180.5 kg)  02/28/19 (!) 401 lb (181.9 kg)     Physical Exam  Constitutional: He is oriented to person, place, and time. No distress.  Morbidly obese male in a motorized chair  HENT:  Head: Normocephalic and atraumatic.  Neck: Normal range of motion. Neck supple. No JVD present.  Cardiovascular: Normal rate and regular rhythm.  Murmur heard.  Harsh midsystolic murmur is present with a grade of 2/6 at the upper right sternal border and upper left sternal border. Pulmonary/Chest: Effort normal and breath sounds normal. No respiratory distress. He has no wheezes. He has no rales.  Abdominal: Soft. Bowel sounds are normal.  Large abdomen  Musculoskeletal: Normal range of motion.        General: Edema present.     Comments: 2+ bilateral lower leg edema up to knees  Neurological: He is alert and oriented to person, place, and time.  Skin: Skin is warm and dry.  Psychiatric: He has a normal mood and affect. His behavior is normal. Judgment and thought content normal.  Vitals reviewed.    ASSESSMENT:    1. SVT (supraventricular tachycardia) (HCC)   2. Palpitations   3. Coronary  artery disease involving native coronary artery of native heart with other form of angina pectoris ([place])   4. Essential (primary) hypertension   5. Morbid obesity ([place])   6. Hyperlipidemia, unspecified hyperlipidemia type   7. Diabetes mellitus type 2 in obese ([place])   8. OSA (obstructive sleep apnea)    PLAN:    In order of problems listed above:  SVT -Patient seen in the ED for palpitations and tachycardia.  He was also seen in the A. fib clinic 02/28/2019.  [name] far no evidence of A. fib has been noted.  -The A. fib clinic ordered a 7-day monitor which showed multiple episodes of SVT with fastest episode at 200 bpm for 14 beats noted on cardiac event monitor -Patient has EP appointment with Dr. Curt Bears on 04/25/2019 for further evaluation and management -Patient is on a beta-blocker with improved symptoms. His episodes are much less frequent and intense. None in the past 3 days.  -He wants to know what to do if recurs. We discussed relaxation and deep breathing. Will provide propranolol 10 mg X1 as needed if symptoms due not improve with relaxation.   CAD -Nonobstructive mild CAD noted on [facility] 01/17/2018 -Patient has been  having some chest discomfort possibly associated with tachyarrhythmia. -He is on aspirin 81 mg, statin, beta-blocker -No significant chest pain.   Hypertension -On lisinopril 10 mg daily, Toprol-XL 50 mg, Lasix 80 mg twice daily -BP well controlled.   Morbid obesity -Body mass index is 51.87 kg/m.  -This is likely contributing to most of his health problems.  -Pt uses a walker at home and motorized chair when out.  -We had a discussion on weight control, diet more than exercise. Due to his size he is unable to exercise at this time. He gives description of a very healthy diet but he is still unable to lose wt. We discussed using a phone app to follow diet and calorie intake.  Hyperlipidemia -On atorvastatin 40 mg daily -LDL 78 in 08/2018.  Continue current  therapy.  Diabetes type 2 -On metformin and glipizide (just added), lisinopril for renal protection and statin -A1c 9.4.  Poorly controlled diabetes.  Healthy diet discussed. -He says sugar has been better.  -Follow-up with PCP.  Obstructive sleep apnea on BiPAP -Reports compliance even with naps -Encouraged compliance for better control of arrhythmias  CKD, unknown stage- he says following due to DM -Followed by nephrology, through the [place].   Aortic stenosis -Mild by echo 03/08/2019.  Faint harsh murmur noted.  Continue to monitor   Medication Adjustments/Labs and Tests Ordered: Current medicines are reviewed at length with the patient today.  Concerns regarding medicines are outlined above. Labs and tests ordered and medication changes are outlined in the patient instructions below:  Patient Instructions  Medication Instructions:  START: Propranolol 10 mg taking 1 tablet as needed for increased heart rate (Do not take more than 1 tablet a day)  If you need a refill on your cardiac medications before your next appointment, please call your pharmacy.   Lab work: None   If you have labs (blood work) drawn today and your tests are completely normal, you will receive your results only by:  [facility] (if you have MyChart) OR  A paper copy in the mail If you have any lab test that is abnormal or we need to change your treatment, we will call you to review the results.  Testing/Procedures: None   Follow-Up: You are scheduled to see Dr. Curt Bears on 04/25/2019 @ 10:00 AM  Any Other Special Instructions Will Be Listed Below (If Applicable).    Supraventricular Tachycardia, Adult Supraventricular tachycardia (SVT) is a kind of abnormal heartbeat. It makes your heart beat very fast and then beat at a normal speed. A normal resting heartbeat is 60-100 times a minute. This condition can make your heart beat more than 150 times a minute. Times of having a fast heartbeat  (episodes) can be scary, but they are usually not dangerous. In some cases, they may lead to heart failure if:  They happen many times per day.  Last longer than a few seconds. What are the causes?  A normal heartbeat starts when an area called the sinoatrial node sends out an electrical signal. In SVT, other areas of the heart send out signals that get in the way of the signal from the sinoatrial node. What increases the risk? You are more likely to develop this condition if you are:  31-25 years old.  A woman. The following factors may make you more likely to develop this condition:  Stress.  Tiredness.  Smoking.  Stimulant drugs, such as cocaine and methamphetamine.  Alcohol.  Caffeine.  Pregnancy.  Feeling worried  or nervous (anxiety). What are the signs or symptoms?  A pounding heart.  A feeling that your heart is skipping beats (palpitations).  Weakness.  Trouble getting enough air.  Pain or tightness in your chest.  Feeling like you are going to pass out (faint).  Feeling worried or nervous.  Dizziness.  Sweating.  Feeling sick to your stomach (nausea).  Passing out.  Tiredness. Sometimes, there are no symptoms. How is this treated?  Vagal nerve stimulation. Ways to do this include: ? Holding your breath and pushing, as though you are pooping (having a bowel movement). ? Massaging an area on one side of your neck. Do not try this yourself. Only a doctor should do this. If done the wrong way, it can lead to a stroke. ? Bending forward with your head between your legs. ? Coughing while bending forward with your head between your legs. ? Closing your eyes and massaging your eyeballs. Ask a doctor how to do this.  Medicines that prevent attacks.  Medicine to stop an attack given through an IV tube at the hospital.  A small electric shock (cardioversion) that stops an attack.  Radiofrequency ablation. In this procedure, a small, thin tube  (catheter) is used to send energy to the area that is causing the rapid heartbeats. If you do not have symptoms, you may not need treatment. Follow these instructions at home: Stress  Avoid things that make you feel stressed.  To deal with stress, try: ? Doing yoga or meditation, or being out in nature. ? Listening to relaxing music. ? Doing deep breathing. ? Taking steps to be healthy, such as getting lots of sleep, exercising, and eating a balanced diet. ? Talking with a mental health doctor. Lifestyle   Try to get at least 7 hours of sleep each night.  Do not use any products that contain nicotine or tobacco, such as cigarettes, e-cigarettes, and chewing tobacco. If you need help quitting, ask your doctor.  Be aware of how alcohol affects you. ? If alcohol gives you a fast heartbeat, do not drink alcohol. ? If alcohol does not seem to give you a fast heartbeat, limit alcohol use to no more than 1 drink a day for women who are not pregnant, and 2 drinks a day for men. In the [place] one drink is one of these: ? 12 oz of beer (355 mL). ? 5 oz of wine (148 mL). ? 1 oz of hard liquor (44 mL).  Be aware of how caffeine affects you. ? If caffeine gives you a fast heartbeat, do not eat, drink, or use anything with caffeine in it. ? If caffeine does not seem to give you a fast heartbeat, limit how much caffeine you eat, drink, or use.  Do not use stimulant drugs. If you need help quitting, ask your doctor. General instructions  Stay at a healthy weight.  Exercise regularly. Ask your doctor about good activities for you. Try one or a mixture of these: ? 150 minutes a week of gentle exercise, like walking or yoga. ? 75 minutes a week of exercise that is very active, like running or swimming.  Do vagus nerve treatments to slow down your heartbeat as told by your doctor.  Take over-the-counter and prescription medicines only as told by your doctor.  Keep all follow-up visits as told  by your doctor. This is important. Contact a doctor if:  You have a fast heartbeat more often.  Times of having a fast heartbeat  last longer than before.  Home treatments to slow down your heartbeat do not help.  You have new symptoms. Get help right away if:  You have chest pain.  Your symptoms get worse.  You have trouble breathing.  Your heart beats very fast for more than 20 minutes.  You pass out. These symptoms may be an emergency. Do not wait to see if the symptoms will go away. Get [name] help right away. Call your local emergency services (911 in the [place] Do not drive yourself to the hospital. Summary  SVT is a type of abnormal heart beat.  This condition can make your heart beat more than 150 times a minute.  Treatment depends on how often the condition happens and your symptoms. This information is not intended to replace advice given to you by your health care provider. Make sure you discuss any questions you have with your health care provider. Document Released: 07/25/2005 Document Revised: 06/12/2018 Document Reviewed: 06/12/2018 Elsevier Patient Education  2020 [place].  ===================================================================  Lifestyle Modifications to Prevent and Treat Heart Disease -Recommend heart healthy/Mediterranean diet, with whole grains, fruits, vegetables, fish, lean meats, nuts, olive oil and avocado oil.  -Limit salt intake to less than 1500 mg per day.  -Recommend moderate walking, starting slowly with a few minutes and working up to 3-5 times/week for 30-50 minutes each session. Aim for at least 150 minutes.week. Goal should be pace of 3 miles/hours, or walking 1.5 miles in 30 minutes -Recommend avoidance of tobacco products. Avoid excess alcohol. -Keep blood pressure well controlled, ideally less than 130/80.   ====================================================================  Diabetes Mellitus and Nutrition,  Adult When you have diabetes (diabetes mellitus), it is very important to have healthy eating habits because your blood sugar (glucose) levels are greatly affected by what you eat and drink. Eating healthy foods in the appropriate amounts, at about the same times every day, can help you:  Control your blood glucose.  Lower your risk of heart disease.  Improve your blood pressure.  Reach or maintain a healthy weight. Every person with diabetes is different, and each person has different needs for a meal plan. Your health care provider may recommend that you work with a diet and nutrition specialist (dietitian) to make a meal plan that is best for you. Your meal plan may vary depending on factors such as:  The calories you need.  The medicines you take.  Your weight.  Your blood glucose, blood pressure, and cholesterol levels.  Your activity level.  Other health conditions you have, such as heart or kidney disease. How do carbohydrates affect me? Carbohydrates, also called carbs, affect your blood glucose level more than any other type of food. Eating carbs naturally raises the amount of glucose in your blood. Carb counting is a method for keeping track of how many carbs you eat. Counting carbs is important to keep your blood glucose at a healthy level, especially if you use insulin or take certain oral diabetes medicines. It is important to know how many carbs you can safely have in each meal. This is different for every person. Your dietitian can help you calculate how many carbs you should have at each meal and for each snack. Foods that contain carbs include:  Bread, cereal, rice, pasta, and crackers.  Potatoes and corn.  Peas, beans, and lentils.  Milk and yogurt.  Fruit and juice.  Desserts, such as cakes, cookies, ice cream, and candy. How does alcohol affect me? Alcohol can cause  a sudden decrease in blood glucose (hypoglycemia), especially if you use insulin or take  certain oral diabetes medicines. Hypoglycemia can be a life-threatening condition. Symptoms of hypoglycemia (sleepiness, dizziness, and confusion) are similar to symptoms of having too much alcohol. If your health care provider says that alcohol is safe for you, follow these guidelines:  Limit alcohol intake to no more than 1 drink per day for nonpregnant women and 2 drinks per day for men. One drink equals 12 oz of beer, 5 oz of wine, or 1 oz of hard liquor.  Do not drink on an empty stomach.  Keep yourself hydrated with water, diet soda, or unsweetened iced tea.  Keep in mind that regular soda, juice, and other mixers may contain a lot of sugar and must be counted as carbs. What are tips for following this plan?  Reading food labels  Start by checking the serving size on the "Nutrition Facts" label of packaged foods and drinks. The amount of calories, carbs, fats, and other nutrients listed on the label is based on one serving of the item. Many items contain more than one serving per package.  Check the total grams (g) of carbs in one serving. You can calculate the number of servings of carbs in one serving by dividing the total carbs by 15. For example, if a food has 30 g of total carbs, it would be equal to 2 servings of carbs.  Check the number of grams (g) of saturated and trans fats in one serving. Choose foods that have low or no amount of these fats.  Check the number of milligrams (mg) of salt (sodium) in one serving. Most people should limit total sodium intake to less than 2,300 mg per day.  Always check the nutrition information of foods labeled as "low-fat" or "nonfat". These foods may be higher in added sugar or refined carbs and should be avoided.  Talk to your dietitian to identify your daily goals for nutrients listed on the label. Shopping  Avoid buying canned, premade, or processed foods. These foods tend to be high in fat, sodium, and added sugar.  Shop around the  outside edge of the grocery store. This includes fresh fruits and vegetables, bulk grains, fresh meats, and fresh dairy. Cooking  Use low-heat cooking methods, such as baking, instead of high-heat cooking methods like deep frying.  Cook using healthy oils, such as olive, canola, or sunflower oil.  Avoid cooking with butter, cream, or high-fat meats. Meal planning  Eat meals and snacks regularly, preferably at the same times every day. Avoid going long periods of time without eating.  Eat foods high in fiber, such as fresh fruits, vegetables, beans, and whole grains. Talk to your dietitian about how many servings of carbs you can eat at each meal.  Eat 4-6 ounces (oz) of lean protein each day, such as lean meat, chicken, fish, eggs, or tofu. One oz of lean protein is equal to: ? 1 oz of meat, chicken, or fish. ? 1 egg. ?  cup of tofu.  Eat some foods each day that contain healthy fats, such as avocado, nuts, seeds, and fish. Lifestyle  Check your blood glucose regularly.  Exercise regularly as told by your health care provider. This may include: ? 150 minutes of moderate-intensity or vigorous-intensity exercise each week. This could be brisk walking, biking, or water aerobics. ? Stretching and doing strength exercises, such as yoga or weightlifting, at least 2 times a week.  Take medicines as  told by your health care provider.  Do not use any products that contain nicotine or tobacco, such as cigarettes and e-cigarettes. If you need help quitting, ask your health care provider.  Work with a Social worker or diabetes educator to identify strategies to manage stress and any emotional and social challenges. Questions to ask a health care provider  Do I need to meet with a diabetes educator?  Do I need to meet with a dietitian?  What number can I call if I have questions?  When are the best times to check my blood glucose? Where to find more information:  American Diabetes  Association: diabetes.org  Academy of Nutrition and Dietetics: www.eatright.[employer] of Diabetes and Digestive and Kidney Diseases (NIH): [employer] Summary  A healthy meal plan will help you control your blood glucose and maintain a healthy lifestyle.  Working with a diet and nutrition specialist (dietitian) can help you make a meal plan that is best for you.  Keep in mind that carbohydrates (carbs) and alcohol have immediate effects on your blood glucose levels. It is important to count carbs and to use alcohol carefully. This information is not intended to replace advice given to you by your health care provider. Make sure you discuss any questions you have with your health care provider. Document Released: 04/21/2005 Document Revised: 07/07/2017 Document Reviewed: 08/29/2016 Elsevier Patient Education  2020 [place], Daune Perch, NP  03/20/2019 11:34 AM    [place]

## 2019-03-20 NOTE — Patient Instructions (Addendum)
Medication Instructions:  START: Propranolol 10 mg taking 1 tablet as needed for increased heart rate (Do not take more than 1 tablet a day)  If you need a refill on your cardiac medications before your next appointment, please call your pharmacy.   Lab work: None   If you have labs (blood work) drawn today and your tests are completely normal, you will receive your results only by: [name] MyChart Message (if you have MyChart) OR . A paper copy in the mail If you have any lab test that is abnormal or we need to change your treatment, we will call you to review the results.  Testing/Procedures: None   Follow-Up: You are scheduled to see Dr. Curt Bears on 04/25/2019 @ 10:00 AM  Any Other Special Instructions Will Be Listed Below (If Applicable).    Supraventricular Tachycardia, Adult Supraventricular tachycardia (SVT) is a kind of abnormal heartbeat. It makes your heart beat very fast and then beat at a normal speed. A normal resting heartbeat is 60-100 times a minute. This condition can make your heart beat more than 150 times a minute. Times of having a fast heartbeat (episodes) can be scary, but they are usually not dangerous. In some cases, they may lead to heart failure if:  They happen many times per day.  Last longer than a few seconds. What are the causes?  A normal heartbeat starts when an area called the sinoatrial node sends out an electrical signal. In SVT, other areas of the heart send out signals that get in the way of the signal from the sinoatrial node. What increases the risk? You are more likely to develop this condition if you are:  79-67 years old.  A woman. The following factors may make you more likely to develop this condition:  Stress.  Tiredness.  Smoking.  Stimulant drugs, such as cocaine and methamphetamine.  Alcohol.  Caffeine.  Pregnancy.  Feeling worried or nervous (anxiety). What are the signs or symptoms?  A pounding heart.  A feeling  that your heart is skipping beats (palpitations).  Weakness.  Trouble getting enough air.  Pain or tightness in your chest.  Feeling like you are going to pass out (faint).  Feeling worried or nervous.  Dizziness.  Sweating.  Feeling sick to your stomach (nausea).  Passing out.  Tiredness. Sometimes, there are no symptoms. How is this treated?  Vagal nerve stimulation. Ways to do this include: ? Holding your breath and pushing, as though you are pooping (having a bowel movement). ? Massaging an area on one side of your neck. Do not try this yourself. Only a doctor should do this. If done the wrong way, it can lead to a stroke. ? Bending forward with your head between your legs. ? Coughing while bending forward with your head between your legs. ? Closing your eyes and massaging your eyeballs. Ask a doctor how to do this.  Medicines that prevent attacks.  Medicine to stop an attack given through an IV tube at the hospital.  A small electric shock (cardioversion) that stops an attack.  Radiofrequency ablation. In this procedure, a small, thin tube (catheter) is used to send energy to the area that is causing the rapid heartbeats. If you do not have symptoms, you may not need treatment. Follow these instructions at home: Stress  Avoid things that make you feel stressed.  To deal with stress, try: ? Doing yoga or meditation, or being out in nature. ? Listening to relaxing music. ?  Doing deep breathing. ? Taking steps to be healthy, such as getting lots of sleep, exercising, and eating a balanced diet. ? Talking with a mental health doctor. Lifestyle   Try to get at least 7 hours of sleep each night.  Do not use any products that contain nicotine or tobacco, such as cigarettes, e-cigarettes, and chewing tobacco. If you need help quitting, ask your doctor.  Be aware of how alcohol affects you. ? If alcohol gives you a fast heartbeat, do not drink alcohol. ? If  alcohol does not seem to give you a fast heartbeat, limit alcohol use to no more than 1 drink a day for women who are not pregnant, and 2 drinks a day for men. In the [place] one drink is one of these: ? 12 oz of beer (355 mL). ? 5 oz of wine (148 mL). ? 1 oz of hard liquor (44 mL).  Be aware of how caffeine affects you. ? If caffeine gives you a fast heartbeat, do not eat, drink, or use anything with caffeine in it. ? If caffeine does not seem to give you a fast heartbeat, limit how much caffeine you eat, drink, or use.  Do not use stimulant drugs. If you need help quitting, ask your doctor. General instructions  Stay at a healthy weight.  Exercise regularly. Ask your doctor about good activities for you. Try one or a mixture of these: ? 150 minutes a week of gentle exercise, like walking or yoga. ? 75 minutes a week of exercise that is very active, like running or swimming.  Do vagus nerve treatments to slow down your heartbeat as told by your doctor.  Take over-the-counter and prescription medicines only as told by your doctor.  Keep all follow-up visits as told by your doctor. This is important. Contact a doctor if:  You have a fast heartbeat more often.  Times of having a fast heartbeat last longer than before.  Home treatments to slow down your heartbeat do not help.  You have new symptoms. Get help right away if:  You have chest pain.  Your symptoms get worse.  You have trouble breathing.  Your heart beats very fast for more than 20 minutes.  You pass out. These symptoms may be an emergency. Do not wait to see if the symptoms will go away. Get medical help right away. Call your local emergency services (911 in the [place] Do not drive yourself to the hospital. Summary  SVT is a type of abnormal heart beat.  This condition can make your heart beat more than 150 times a minute.  Treatment depends on how often the condition happens and your symptoms. This  information is not intended to replace advice given to you by your health care provider. Make sure you discuss any questions you have with your health care provider. Document Released: 07/25/2005 Document Revised: 06/12/2018 Document Reviewed: 06/12/2018 Elsevier Patient Education  2020 [employer].  ===================================================================  Lifestyle Modifications to Prevent and Treat Heart Disease -Recommend heart healthy/Mediterranean diet, with whole grains, fruits, vegetables, fish, lean meats, nuts, olive oil and avocado oil.  -Limit salt intake to less than 1500 mg per day.  -Recommend moderate walking, starting slowly with a few minutes and working up to 3-5 times/week for 30-50 minutes each session. Aim for at least 150 minutes.week. Goal should be pace of 3 miles/hours, or walking 1.5 miles in 30 minutes -Recommend avoidance of tobacco products. Avoid excess alcohol. -Keep blood pressure well controlled, ideally  less than 130/80.   ====================================================================  Diabetes Mellitus and Nutrition, Adult When you have diabetes (diabetes mellitus), it is very important to have healthy eating habits because your blood sugar (glucose) levels are greatly affected by what you eat and drink. Eating healthy foods in the appropriate amounts, at about the same times every day, can help you:  Control your blood glucose.  Lower your risk of heart disease.  Improve your blood pressure.  Reach or maintain a healthy weight. Every person with diabetes is different, and each person has different needs for a meal plan. Your health care provider may recommend that you work with a diet and nutrition specialist (dietitian) to make a meal plan that is best for you. Your meal plan may vary depending on factors such as:  The calories you need.  The medicines you take.  Your weight.  Your blood glucose, blood pressure, and cholesterol  levels.  Your activity level.  Other health conditions you have, such as heart or kidney disease. How do carbohydrates affect me? Carbohydrates, also called carbs, affect your blood glucose level more than any other type of food. Eating carbs naturally raises the amount of glucose in your blood. Carb counting is a method for keeping track of how many carbs you eat. Counting carbs is important to keep your blood glucose at a healthy level, especially if you use insulin or take certain oral diabetes medicines. It is important to know how many carbs you can safely have in each meal. This is different for every person. Your dietitian can help you calculate how many carbs you should have at each meal and for each snack. Foods that contain carbs include:  Bread, cereal, rice, pasta, and crackers.  Potatoes and corn.  Peas, beans, and lentils.  Milk and yogurt.  Fruit and juice.  Desserts, such as cakes, cookies, ice cream, and candy. How does alcohol affect me? Alcohol can cause a sudden decrease in blood glucose (hypoglycemia), especially if you use insulin or take certain oral diabetes medicines. Hypoglycemia can be a life-threatening condition. Symptoms of hypoglycemia (sleepiness, dizziness, and confusion) are similar to symptoms of having too much alcohol. If your health care provider says that alcohol is safe for you, follow these guidelines:  Limit alcohol intake to no more than 1 drink per day for nonpregnant women and 2 drinks per day for men. One drink equals 12 oz of beer, 5 oz of wine, or 1 oz of hard liquor.  Do not drink on an empty stomach.  Keep yourself hydrated with water, diet soda, or unsweetened iced tea.  Keep in mind that regular soda, juice, and other mixers may contain a lot of sugar and must be counted as carbs. What are tips for following this plan?  Reading food labels  Start by checking the serving size on the "Nutrition Facts" label of packaged foods and  drinks. The amount of calories, carbs, fats, and other nutrients listed on the label is based on one serving of the item. Many items contain more than one serving per package.  Check the total grams (g) of carbs in one serving. You can calculate the number of servings of carbs in one serving by dividing the total carbs by 15. For example, if a food has 30 g of total carbs, it would be equal to 2 servings of carbs.  Check the number of grams (g) of saturated and trans fats in one serving. Choose foods that have low or no amount  of these fats.  Check the number of milligrams (mg) of salt (sodium) in one serving. Most people should limit total sodium intake to less than 2,300 mg per day.  Always check the nutrition information of foods labeled as "low-fat" or "nonfat". These foods may be higher in added sugar or refined carbs and should be avoided.  Talk to your dietitian to identify your daily goals for nutrients listed on the label. Shopping  Avoid buying canned, premade, or processed foods. These foods tend to be high in fat, sodium, and added sugar.  Shop around the outside edge of the grocery store. This includes fresh fruits and vegetables, bulk grains, fresh meats, and fresh dairy. Cooking  Use low-heat cooking methods, such as baking, instead of high-heat cooking methods like deep frying.  Cook using healthy oils, such as olive, canola, or sunflower oil.  Avoid cooking with butter, cream, or high-fat meats. Meal planning  Eat meals and snacks regularly, preferably at the same times every day. Avoid going long periods of time without eating.  Eat foods high in fiber, such as fresh fruits, vegetables, beans, and whole grains. Talk to your dietitian about how many servings of carbs you can eat at each meal.  Eat 4-6 ounces (oz) of lean protein each day, such as lean meat, chicken, fish, eggs, or tofu. One oz of lean protein is equal to: ? 1 oz of meat, chicken, or fish. ? 1 egg. ?   cup of tofu.  Eat some foods each day that contain healthy fats, such as avocado, nuts, seeds, and fish. Lifestyle  Check your blood glucose regularly.  Exercise regularly as told by your health care provider. This may include: ? 150 minutes of moderate-intensity or vigorous-intensity exercise each week. This could be brisk walking, biking, or water aerobics. ? Stretching and doing strength exercises, such as yoga or weightlifting, at least 2 times a week.  Take medicines as told by your health care provider.  Do not use any products that contain nicotine or tobacco, such as cigarettes and e-cigarettes. If you need help quitting, ask your health care provider.  Work with a Social worker or diabetes educator to identify strategies to manage stress and any emotional and social challenges. Questions to ask a health care provider  Do I need to meet with a diabetes educator?  Do I need to meet with a dietitian?  What number can I call if I have questions?  When are the best times to check my blood glucose? Where to find more information:  American Diabetes Association: diabetes.org  Academy of Nutrition and Dietetics: www.eatright.[employer] of Diabetes and Digestive and Kidney Diseases (NIH): [employer] Summary  A healthy meal plan will help you control your blood glucose and maintain a healthy lifestyle.  Working with a diet and nutrition specialist (dietitian) can help you make a meal plan that is best for you.  Keep in mind that carbohydrates (carbs) and alcohol have immediate effects on your blood glucose levels. It is important to count carbs and to use alcohol carefully. This information is not intended to replace advice given to you by your health care provider. Make sure you discuss any questions you have with your health care provider. Document Released: 04/21/2005 Document Revised: 07/07/2017 Document Reviewed: 08/29/2016 Elsevier Patient Education   2020 [employer].

## 2019-04-09 ENCOUNTER — Ambulatory Visit (INDEPENDENT_AMBULATORY_CARE_PROVIDER_SITE_OTHER): Payer: Medicare Other | Admitting: Cardiology

## 2019-04-09 ENCOUNTER — Encounter: Payer: Self-pay | Admitting: Cardiology

## 2019-04-09 ENCOUNTER — Other Ambulatory Visit: Payer: Self-pay

## 2019-04-09 VITALS — BP 122/68 | HR 76 | Ht 74.0 in | Wt >= 6400 oz

## 2019-04-09 DIAGNOSIS — I471 Supraventricular tachycardia, unspecified: Secondary | ICD-10-CM

## 2019-04-09 MED ORDER — METOPROLOL SUCCINATE ER 50 MG PO TB24: 50.0000 mg | ORAL_TABLET | Freq: Every day | ORAL | 6 refills | Status: DC

## 2019-04-09 NOTE — Progress Notes (Signed)
Electrophysiology Office Note   Date:  04/09/2019   ID:  Lawrence [name], DOB 1956-01-30, MRN [id number]  PCP:  Juana Diaz  Cardiologist:  Radford Pax Primary Electrophysiologist:  Vishnu Moeller Meredith Leeds, MD    Chief Complaint: SVT   History of Present Illness: Lawrence [name] is a 63 y.o. male who is being seen today for the evaluation of SVT at the request of Fenton, Clint R, PA. Presenting today for electrophysiology evaluation.  He has a history of nonobstructive coronary artery disease, hypertension, hyperlipidemia, diastolic heart failure, OSA on CPAP, PTSD, super morbid obesity, gout, depression, anxiety.  He is a DNR.  He was seen in the hospital 01/25/2018 for evaluation of chest pain and shortness of breath.  He was thought to be a poor candidate for ischemic evaluation due to his obesity.  Troponins were negative at this time.  Left heart catheterization was subsequently performed which showed minimal coronary artery disease.  He presented the emergency room 02/19/2019 with palpitations.  No arrhythmias were noted.  He wore a ZIO patch which showed multiple episodes of SVT.  He apparently has been having palpitations for many years.  They have gotten worse since February.  He has daily episodes of heart rates into the 140s, but at times they get into the 200s.  He does use BiPAP at night.  Since starting his metoprolol, his palpitations have gone from daily to weekly.  Today, he denies symptoms of palpitations, chest pain, shortness of breath, orthopnea, PND, lower extremity edema, claudication, dizziness, presyncope, syncope, bleeding, or neurologic sequela. The patient is tolerating medications without difficulties.    Past Medical History:  Diagnosis Date  . Adenomatous colon polyp 2007  . Anemia   . Anxiety   . Arthritis   . Asthma   . Chronic cough   . Chronic diastolic CHF (congestive heart failure) ([place])   . COPD (chronic obstructive pulmonary disease) ([place])   .  Depression   . History of cardiac cath    a.  01/2018 with angiographically minimal coronary disease with  up to 40% prox LAD and 20% mid LCx and OM2 with normal LVF and normal LVEDP..  . Hypertension   . Morbid obesity ([place])   . New onset type 2 diabetes mellitus ([place]) 11/08/2017  . PTSD (post-traumatic stress disorder)   . Sleep apnea    CPAP   Past Surgical History:  Procedure Laterality Date  . JOINT REPLACEMENT     left knee replacement x 2  . KNEE ARTHROSCOPY Bilateral   . LEFT HEART CATH AND CORONARY ANGIOGRAPHY N/A 01/17/2018   Procedure: LEFT HEART CATH AND CORONARY ANGIOGRAPHY;  Surgeon: Leonie Man, MD;  Location: [facility];  Service: Cardiovascular;  Laterality: N/A;  . UMBILICAL HERNIA REPAIR       Current Outpatient Medications  Medication Sig Dispense Refill  . acetaminophen (TYLENOL) 500 MG tablet Take 500 mg by mouth 4 (four) times daily as needed for mild pain.     [name] allopurinol (ZYLOPRIM) 100 MG tablet Take 1 tablet (100 mg total) by mouth daily. 30 tablet 0  . ARIPiprazole (ABILIFY) 5 MG tablet Take 5 mg by mouth at bedtime.     [name] aspirin EC 81 MG EC tablet Take 1 tablet (81 mg total) by mouth daily.    [name] atorvastatin (LIPITOR) 40 MG tablet Take 1 tablet (40 mg total) by mouth daily at 6 PM. 30 tablet 0  . blood glucose meter kit and supplies Dispense  based on patient and insurance preference. Use up to four times daily as directed. (FOR ICD-10 E10.9, E11.9). 1 each 0  . cetirizine (ZYRTEC) 10 MG tablet Take 10 mg by mouth daily.    . ferrous sulfate 324 (65 Fe) MG TBEC Take 1 tablet by mouth daily.     . fluticasone (FLONASE) 50 MCG/ACT nasal spray Place 1 spray into both nostrils daily.     . furosemide (LASIX) 80 MG tablet Take 80 mg by mouth 2 (two) times daily.    [name] glipiZIDE (GLUCOTROL) 5 MG tablet Take 1 tablet (5 mg total) by mouth 2 (two) times daily. 60 tablet 0  . lisinopril (PRINIVIL,ZESTRIL) 10 MG tablet Take 10 mg by mouth daily.    [name]  loperamide (IMODIUM) 2 MG capsule Take 2 mg by mouth 2 (two) times a day.     . meloxicam (MOBIC) 7.5 MG tablet Take 7.5 mg by mouth daily as needed for pain.    . metFORMIN (GLUCOPHAGE) 1000 MG tablet Take 1,000 mg by mouth 2 (two) times daily with a meal.    . prazosin (MINIPRESS) 2 MG capsule Take 4 mg by mouth at bedtime.    . pregabalin (LYRICA) 50 MG capsule Take 50 mg by mouth 3 (three) times daily.    . propranolol (INDERAL) 10 MG tablet Take 1 tablet (10 mg total) by mouth as needed. 10 tablet 0  . silver sulfADIAZINE (SILVADENE) 1 % cream Apply 1 application topically daily. Una boot (both legs)    . temazepam (RESTORIL) 15 MG capsule Take 15 mg by mouth at bedtime.     . traZODone (DESYREL) 100 MG tablet Take 200 mg by mouth at bedtime.    Loura Pardon Salicylate (ASPERCREME EX) Apply 1 application topically 2 (two) times daily as needed (pain and inflammation).    . venlafaxine XR (EFFEXOR-XR) 150 MG 24 hr capsule Take 150 mg by mouth daily with breakfast.    . metoprolol succinate (TOPROL-XL) 50 MG 24 hr tablet Take 1 tablet (50 mg total) by mouth daily. Take with or immediately following a meal. 60 tablet 6   No current facility-administered medications for this visit.     Allergies:   Tubersol [tuberculin], Vancomycin, and Doxycycline   Social History:  The patient  reports that he quit smoking about 3 years ago. He has never used smokeless tobacco. He reports current alcohol use. He reports that he does not use drugs.   Family History:  The patient's family history includes CAD in his maternal grandfather; Diabetes in an other family member.    ROS:  Please see the history of present illness.   Otherwise, review of systems is positive for none.   All other systems are reviewed and negative.    PHYSICAL EXAM: VS:  BP 122/68   Pulse 76   Ht 6' 2" (1.88 m)   Wt (!) 403 lb 6.4 oz (183 kg)   SpO2 92%   BMI 51.79 kg/m  , BMI Body mass index is 51.79 kg/m. GEN: Well  nourished, well developed, in no acute distress  HEENT: normal  Neck: no JVD, carotid bruits, or masses Cardiac: RRR; no murmurs, rubs, or gallops,no edema  Respiratory:  clear to auscultation bilaterally, normal work of breathing GI: soft, nontender, nondistended, + BS MS: no deformity or atrophy  Skin: warm and dry Neuro:  Strength and sensation are intact Psych: euthymic mood, full affect  EKG:  EKG is not ordered today. Personal review of  the ekg ordered 03/07/19 shows SVT  Recent Labs: 02/19/2019: ALT 26; B Natriuretic Peptide 38.6 03/07/2019: BUN 27; Creatinine, Ser 1.05; Magnesium 1.8; Potassium 3.9; Sodium 133; TSH 1.648 03/08/2019: Hemoglobin 12.8; Platelets 309    Lipid Panel     Component Value Date/Time   CHOL 149 08/18/2018 0144   TRIG 174 (H) 08/18/2018 0144   HDL 36 (L) 08/18/2018 0144   CHOLHDL 4.1 08/18/2018 0144   VLDL 35 08/18/2018 0144   LDLCALC 78 08/18/2018 0144     Wt Readings from Last 3 Encounters:  04/09/19 (!) 403 lb 6.4 oz (183 kg)  03/20/19 (!) 404 lb (183.3 kg)  03/08/19 (!) 397 lb 14.9 oz (180.5 kg)      Other studies Reviewed: Additional studies/ records that were reviewed today include: Zio 02/28/19 personally reviewed  Review of the above records today demonstrates:  Max 200 bpm 09:28pm, 07/28 Min 66 bpm 08:09am, 07/28 Avg 92 bpm Multiple episodes of SVT Fastest episode at 200 BPM for 14 beats Longest episode at 15 minutes with average HR of 137 bpm Symptoms of chest pain associated with sinus rhythm Artifact makes interpretation difficult  TTE 03/08/19  1. The left ventricle has normal systolic function, with an ejection fraction of 55-60%. The cavity size was normal. There is moderate concentric left ventricular hypertrophy. Left ventricular diastolic Doppler parameters are consistent with  pseudonormalization. No evidence of left ventricular regional wall motion abnormalities.  2. The right ventricle has normal systolic function.  The cavity was normal. There is no increase in right ventricular wall thickness.  3. Left atrial size was mildly dilated.  4. Right atrial size was mildly dilated.  5. Mild thickening of the mitral valve leaflet. Mild calcification of the mitral valve leaflet. There is moderate to severe mitral annular calcification present.  6. The aortic valve is tricuspid. Moderate thickening of the aortic valve. Moderate calcification of the aortic valve. Mild stenosis of the aortic valve.  7. The aorta is normal in size and structure.  8. The aortic root is normal in size and structure.  ASSESSMENT AND PLAN:  1.  SVT: Currently on propranolol and metoprolol with improvement in symptoms.  He is having weekly episodes still, which is improved from daily episodes.  We Cincere Zorn thus plan to increase his Toprol-XL to 50 mg.  He is taking Tegretol as needed.  2.  Coronary artery disease: Nonobstructive on 2019 catheterization.  No current chest pain.  3.  Hypertension: Currently on lisinopril, Toprol-XL, Lasix.  Currently well controlled  4.  Morbid obesity: Uses a walker at home.  Discussed weight control with diet and exercise previously.  5.  Obstructive sleep apnea: BiPAP compliance encouraged  Current medicines are reviewed at length with the patient today.   The patient does not have concerns regarding his medicines.  The following changes were made today: Increase Toprol-XL  Labs/ tests ordered today include:  No orders of the defined types were placed in this encounter.    Disposition:   FU with Nesta Scaturro 6 months  Signed, Emmet Messer Meredith Leeds, MD  04/09/2019 10:39 AM     [facility] [address] [place] [address] [phone] (office) [phone] (fax)

## 2019-04-09 NOTE — Patient Instructions (Signed)
Medication Instructions:  Your physician has recommended you make the following change in your medication:  1. INCREASE Toprol to 50 mg TWICE a day  * If you need a refill on your cardiac medications before your next appointment, please call your pharmacy.   Labwork: None ordered *We will only notify you of abnormal results, otherwise continue current treatment plan.  Testing/Procedures: None ordered  Follow-Up: Your physician wants you to follow-up in: 6 months with Dr. Curt Bears.  You will receive a reminder letter in the mail two months in advance. If you don't receive a letter, please call our office to schedule the follow-up appointment.  Thank you for choosing [facility]!!   Trinidad Curet, RN [phone]

## 2019-04-25 ENCOUNTER — Ambulatory Visit: Payer: No Typology Code available for payment source | Admitting: Cardiology

## 2019-07-03 ENCOUNTER — Ambulatory Visit: Payer: Self-pay | Admitting: Podiatry

## 2019-07-22 ENCOUNTER — Telehealth: Payer: Self-pay | Admitting: Cardiology

## 2019-07-22 MED ORDER — METOPROLOL SUCCINATE ER 50 MG PO TB24: 50.0000 mg | ORAL_TABLET | Freq: Two times a day (BID) | ORAL | 6 refills | Status: DC

## 2019-07-22 NOTE — Telephone Encounter (Signed)
New Message  Patient sent a my chart message wanting to be seen about his fast heartbeats. Please contact patient

## 2019-07-22 NOTE — Telephone Encounter (Signed)
Pt calling in to report increased episodes of elevated HRs for the past week. Happening almost daily now.  HRs going into 90-100, used to be 60-70s on his current medication. "Pains associated w/ elevated HRs, a nuisance pain, dull ache between left chest and arm.  This occurs only when HR is elevated. States he has a "little SOB", "maybe a little dizziness". BP: 121/67, 115/68, 112/65, 116/73 Pt is currently taking Toprol  50 mg BID (incorrect in chart, will correct to reflect correct dosing), and has Propranolol PRN but has no instructions for the PRN med on when/how often to take. Pt aware that 90-100 is WNL but I would forward to [facility] for advisement.

## 2019-07-23 NOTE — Telephone Encounter (Signed)
Pt advised to increase Toprol to 75 mg BID per Dr. Curt Bears. Pt agreeable to plan. Pt understands I will not send in updated Rx to pharmacy until he tries it for several days.  Pt will let us know if updated Rx needs to be sent in after trying it. Patient verbalized understanding and agreeable to plan.

## 2019-07-26 ENCOUNTER — Telehealth: Payer: Self-pay | Admitting: *Deleted

## 2019-07-26 NOTE — Telephone Encounter (Signed)
Received pt advise request from pt stating: Non-Urgent Medical Question  Lawrence Dumas "[name]"  Constance Haw, MD 10 minutes ago (5:21 PM)    I'm having minor chest pain. Mainly under the left arm, but sometimes it is toward the center. Hr was 74, bp is 114/67.  Called and spoke with patient who reports this has been coming and going for about a week.  He describes it as a "nuisance pain" that does ever completely go away.  He is sitting and watching TV and can feel it.  Mostly he feels it in his left arm pit and occasionally has radiation into the center of his chest.  It feels dull in nature.  He denies any n/v and no more SOB than his normal with his known breathing problems.  He has taken his "backup medication" propranolol and 4 baby ASA without relief.  His metoprolol was recently increased to 75 mg BID.  Pt reports this is keeping his heart rate down.  Advised pt if having chest pain with radiation without relief that is causing him concerns, he will need to report to the closest ED for further evaluation.  Pt reports he is sitting down to eat dinner and doesn't feel as though he needs to go to the ED at this time. Advised if s/s worsen or change to call 911 and be taken to the closest ED for evaluation.  He states understanding.

## 2019-07-30 NOTE — Telephone Encounter (Signed)
Followed up w/ pt who reports doing better since he called last week.  No further episodes. Advised Dr. Curt Bears recommends f/u w/ an APP to evaluate if testing needed. Aware scheduler will contact him to arrange. Pt has not seen a general cardiologist in awhile. He does not travel to the [place] anymore and has permission to see more local doctors. They were under the impression Dr. Curt Bears was their "cardiologist".  Explained that typically he does not follow gen card needs but I will confirm with him prior to arranging referral to gen card. Patient verbalized understanding and agreeable to plan.  (Thursdays are best day for OV)

## 2019-08-08 NOTE — Telephone Encounter (Signed)
Apologized for delay in returning call.  Informed pt I was out of the office this week unexpectedly and only returned today. Pt scheduled to follow up w/ Tommye Standard next week to determine if further cardiac testing needed.  Aware we can refer him to a general cardiologist at that appt (he thought Dustin would take care of gen card needs, but made aware he should establish w/ one and rationale given. He used to see a gen card throught the [place]). Made aware that Joseph Art would advise on when his f/u w/ Camnitz is needed at appt next week. Patient verbalized understanding and agreeable to plan.   Will forward to Tommye Standard, [place] for her Juluis Rainier

## 2019-08-11 NOTE — Progress Notes (Addendum)
Cardiology Office Note Date:  08/13/2019  Patient ID:  Lawrence [name], Lawrence [name] 09/05/1955, MRN [id number] PCP:  [facility]  Cardiologist:  Dr. Radford Pax Electrophysiologist: Dr. Curt Bears     Chief Complaint:  f/u SVT, medication adjustments recently    History of Present Illness: Lawrence [name] is a 64 y.o. male with history of CAD (non-obstructive by cath June 2019), HTN, HLD, chronic CHF (diastolic), OSA w/CPAP, PTSD, super morbid obesity, depression/anxiety, gout.  With increased episodes of palpitations and CP, underwent monitoring noted episodes of svts 140's-200's and referred to Dr. Curt Bears.   He saw Dr. Curt Bears 04/09/2019, at that time was on metoprolol sand propanolol his daily episodes had reduced to weekly.  His Metoprolol succ was increased to '50mg'$  daily and planned for 6 mo follow up.  Encouraged CPAP compliance.  He comes accompanied by his "significant other", mentions that his memory is poor and she takes care of his medicines, care management.  He comes using an Transport planner, though they report he ambulates in the house with a walker, though minimally 2/2 severe arthritis pain b/l hips, and especially his knees. With the last adjustment in his toprol to '75mg'$  BID, he does nott  ink that he has had any tachycardias.  He mentions his symptoms is an awareness/ache in his chest and unusual SOB is his symptom with the SVT, not particularly palpitations.   He denies any CP, no rest SOB, he uses his CPAP every night, denies any symptoms of PND or orthopnea. He has gained weight though both of them feel this is diet related over the holidays, not fluid retention.  They report his legs look great in comparison to how they h ave been historically. No dizzy spells, no near syncope or syncope.   Past Medical History:  Diagnosis Date  . Adenomatous colon polyp 2007  . Anemia   . Anxiety   . Arthritis   . Asthma   . Chronic cough   . Chronic diastolic CHF (congestive heart  failure) ([place])   . COPD (chronic obstructive pulmonary disease) ([place])   . Depression   . History of cardiac cath    a.  01/2018 with angiographically minimal coronary disease with  up to 40% prox LAD and 20% mid LCx and OM2 with normal LVF and normal LVEDP..  . Hypertension   . Morbid obesity ([place])   . New onset type 2 diabetes mellitus ([place]) 11/08/2017  . PTSD (post-traumatic stress disorder)   . Sleep apnea    CPAP    Past Surgical History:  Procedure Laterality Date  . JOINT REPLACEMENT     left knee replacement x 2  . KNEE ARTHROSCOPY Bilateral   . LEFT HEART CATH AND CORONARY ANGIOGRAPHY N/A 01/17/2018   Procedure: LEFT HEART CATH AND CORONARY ANGIOGRAPHY;  Surgeon: Leonie Man, MD;  Location: [facility];  Service: Cardiovascular;  Laterality: N/A;  . UMBILICAL HERNIA REPAIR      Current Outpatient Medications  Medication Sig Dispense Refill  . acetaminophen (TYLENOL) 500 MG tablet Take 500 mg by mouth 4 (four) times daily as needed for mild pain.     [name] allopurinol (ZYLOPRIM) 100 MG tablet Take 1 tablet (100 mg total) by mouth daily. 30 tablet 0  . ARIPiprazole (ABILIFY) 5 MG tablet Take 5 mg by mouth at bedtime.     [name] aspirin EC 81 MG EC tablet Take 1 tablet (81 mg total) by mouth daily.    [name] atorvastatin (LIPITOR) 40  MG tablet Take 1 tablet (40 mg total) by mouth daily at 6 PM. 30 tablet 0  . blood glucose meter kit and supplies Dispense based on patient and insurance preference. Use up to four times daily as directed. (FOR ICD-10 E10.9, E11.9). 1 each 0  . cetirizine (ZYRTEC) 10 MG tablet Take 10 mg by mouth daily.    . ferrous sulfate 324 (65 Fe) MG TBEC Take 1 tablet by mouth daily.     . fluticasone (FLONASE) 50 MCG/ACT nasal spray Place 1 spray into both nostrils daily.     . furosemide (LASIX) 80 MG tablet Take 80 mg by mouth 2 (two) times daily.    [name] glipiZIDE (GLUCOTROL) 5 MG tablet Take 1 tablet (5 mg total) by mouth 2 (two) times daily. 60 tablet 0  .  lisinopril (PRINIVIL,ZESTRIL) 10 MG tablet Take 10 mg by mouth daily.    [name] loperamide (IMODIUM) 2 MG capsule Take 2 mg by mouth 2 (two) times a day.     . meloxicam (MOBIC) 7.5 MG tablet Take 7.5 mg by mouth daily as needed for pain.    . metFORMIN (GLUCOPHAGE) 1000 MG tablet Take 1,000 mg by mouth 2 (two) times daily with a meal.    . metoprolol succinate (TOPROL-XL) 25 MG 24 hr tablet Take 3 tablets (75 mg total) by mouth 2 (two) times daily. Take with or immediately following a meal. 180 tablet 7  . prazosin (MINIPRESS) 2 MG capsule Take 4 mg by mouth at bedtime.    . pregabalin (LYRICA) 100 MG capsule Take 100 mg by mouth 3 (three) times daily.    . propranolol (INDERAL) 10 MG tablet Take 1 tablet (10 mg total) by mouth as needed. 10 tablet 0  . silver sulfADIAZINE (SILVADENE) 1 % cream Apply 1 application topically daily. Una boot (both legs)    . temazepam (RESTORIL) 15 MG capsule Take 15 mg by mouth at bedtime.     . traZODone (DESYREL) 100 MG tablet Take 200 mg by mouth at bedtime.    Loura Pardon Salicylate (ASPERCREME EX) Apply 1 application topically 2 (two) times daily as needed (pain and inflammation).    . venlafaxine XR (EFFEXOR-XR) 150 MG 24 hr capsule Take 150 mg by mouth daily with breakfast.     No current facility-administered medications for this visit.    Allergies:   Tubersol [tuberculin], Vancomycin, and Doxycycline   Social History:  The patient  reports that he quit smoking about 3 years ago. He has never used smokeless tobacco. He reports current alcohol use. He reports that he does not use drugs.   Family History:  The patient's family history includes CAD in his maternal grandfather; Diabetes in an other family member.  ROS:  Please see the history of present illness.  All other systems are reviewed and otherwise negative.   PHYSICAL EXAM:  VS:  BP 127/78   Pulse 78   Ht '6\' 2"'$  (1.88 m)   Wt (!) 417 lb (189.1 kg)   BMI 53.54 kg/m  BMI: Body mass index is  53.54 kg/m. Well nourished, well developed, in no acute distress  HEENT: normocephalic, atraumatic  Neck:  JVD is difficult to assess seated/obses neck, carotid bruits or masses Cardiac:   RRR; no significant murmurs, no rubs, or gallops Lungs: CTA b/l no wheezing, rhonchi or rales  Abd: soft, nontender, obese MS: no deformity or atrophy Ext: brawny type edema, some scabs b/l, no erythema, no pitting edema  Skin:  warm and dry, no rash Neuro:  No gross deficits appreciated Psych: euthymic mood, full affect   EKG:  Not done today   Zio 02/28/19  Max 200 bpm 09:28pm, 07/28 Min 66 bpm 08:09am, 07/28 Avg 92 bpm Multiple episodes of SVT Fastest episode at 200 BPM for 14 beats Longest episode at 15 minutes with average HR of 137 bpm Symptoms of chest pain associated with sinus rhythm Artifact makes interpretation difficult    TTE 03/08/19 1. The left ventricle has normal systolic function, with an ejection fraction of 55-60%. The cavity size was normal. There is moderate concentric left ventricular hypertrophy. Left ventricular diastolic Doppler parameters are consistent with  pseudonormalization. No evidence of left ventricular regional wall motion abnormalities. 2. The right ventricle has normal systolic function. The cavity was normal. There is no increase in right ventricular wall thickness. 3. Left atrial size was mildly dilated. 4. Right atrial size was mildly dilated. 5. Mild thickening of the mitral valve leaflet. Mild calcification of the mitral valve leaflet. There is moderate to severe mitral annular calcification present. 6. The aortic valve is tricuspid. Moderate thickening of the aortic valve. Moderate calcification of the aortic valve. Mild stenosis of the aortic valve. 7. The aorta is normal in size and structure. 8. The aortic root is normal in size and structure.   01/17/2018: LHC  There is hyperdynamic left ventricular systolic function.  LV end  diastolic pressure is normal.  The left ventricular ejection fraction is greater than 65% by visual estimate.  There is no mitral valve regurgitation.  There is no aortic valve stenosis.  Prox LAD to Mid LAD lesion is 40% stenosed.  Prox Cx to Mid Cx lesion is 20% stenosed.  Ost 2nd Mrg lesion is 20% stenosed.   Angiographically minimal coronary disease with ectatic RCA and circumflex. Normal LVEDP and normal LV function/hyperdynamic.   Plan: Return to nursing unit for post catheterization care & TR band removal. Would be okay for discharge from cardiac standpoint after bedrest post TR band removal.     Recent Labs: 02/19/2019: ALT 26; B Natriuretic Peptide 38.6 03/07/2019: BUN 27; Creatinine, Ser 1.05; Magnesium 1.8; Potassium 3.9; Sodium 133; TSH 1.648 03/08/2019: Hemoglobin 12.8; Platelets 309  08/18/2018: Cholesterol 149; HDL 36; LDL Cholesterol 78; Total CHOL/HDL Ratio 4.1; Triglycerides 174; VLDL 35   CrCl cannot be calculated (Patient's most recent lab result is older than the maximum 21 days allowed.).   Wt Readings from Last 3 Encounters:  08/13/19 (!) 417 lb (189.1 kg)  04/09/19 (!) 403 lb 6.4 oz (183 kg)  03/20/19 (!) 404 lb (183.3 kg)     Other studies reviewed: Additional studies/records reviewed today include: summarized above  ASSESSMENT AND PLAN:  1. SVT     no symptoms of his SVT since last up-titration of his metoprolol succ     He uses the propanolol as PRN only, previously using a couple days a week, none sine his metoprolol titration     Continue same '75mg'$  BID   2. HTN     Looks OK, he brings home daily cheksno changes today  3. Chronic CHF (diastolic)     His weight is up, though no symptoms or clear exam findings of volume OL     He is on '80mg'$  BID of lasix and reports good uring OP daily (reports 6 - 32oz [place] tea bottles per day output)     Discussed importance of weight reduction, diet, and low sodium  BMET today given his  lasix   Disposition: He would like to continue with Dr. Radford Pax, very much liked her for his cardiologist.  Will have him scheduled with her at her in Feb or so.  EP to see in 6 months or PRN    Current medicines are reviewed at length with the patient today.  The patient did not have any concerns regarding medicines.  Venetia Night, PA-C 08/13/2019 11:55 AM     [facility] [place] [address] [phone] (office)  [phone] (fax)

## 2019-08-13 ENCOUNTER — Ambulatory Visit (INDEPENDENT_AMBULATORY_CARE_PROVIDER_SITE_OTHER): Payer: Medicare Other | Admitting: Physician Assistant

## 2019-08-13 ENCOUNTER — Other Ambulatory Visit: Payer: Self-pay

## 2019-08-13 VITALS — BP 127/78 | HR 78 | Ht 74.0 in | Wt >= 6400 oz

## 2019-08-13 DIAGNOSIS — Z79899 Other long term (current) drug therapy: Secondary | ICD-10-CM | POA: Diagnosis not present

## 2019-08-13 DIAGNOSIS — I5032 Chronic diastolic (congestive) heart failure: Secondary | ICD-10-CM

## 2019-08-13 DIAGNOSIS — I471 Supraventricular tachycardia, unspecified: Secondary | ICD-10-CM

## 2019-08-13 DIAGNOSIS — I10 Essential (primary) hypertension: Secondary | ICD-10-CM

## 2019-08-13 LAB — BASIC METABOLIC PANEL
BUN/Creatinine Ratio: 28 — ABNORMAL HIGH (ref 10–24)
BUN: 27 mg/dL (ref 8–27)
CO2: 25 mmol/L (ref 20–29)
Calcium: 9.3 mg/dL (ref 8.6–10.2)
Chloride: 95 mmol/L — ABNORMAL LOW (ref 96–106)
Creatinine, Ser: 0.95 mg/dL (ref 0.76–1.27)
GFR calc Af Amer: 99 mL/min/{1.73_m2} (ref >59)
GFR calc non Af Amer: 85 mL/min/{1.73_m2} (ref >59)
Glucose: 133 mg/dL — ABNORMAL HIGH (ref 65–99)
Potassium: 4.4 mmol/L (ref 3.5–5.2)
Sodium: 133 mmol/L — ABNORMAL LOW (ref 134–144)

## 2019-08-13 MED ORDER — METOPROLOL SUCCINATE ER 25 MG PO TB24: 75.0000 mg | ORAL_TABLET | Freq: Two times a day (BID) | ORAL | 7 refills | Status: DC

## 2019-08-13 NOTE — Patient Instructions (Signed)
  Medication Instructions:   Your physician recommends that you continue on your current medications as directed. Please refer to the Current Medication list given to you today.   *If you need a refill on your cardiac medications before your next appointment, please call your pharmacy*  Lab Work:  BMET TODAY   If you have labs (blood work) drawn today and your tests are completely normal, you will receive your results only by: [name] MyChart Message (if you have MyChart) OR . A paper copy in the mail If you have any lab test that is abnormal or we need to change your treatment, we will call you to review the results.  Testing/Procedures: NONE ORDERED  TODAY   Follow-Up: At [facility], you and your health needs are our priority.  As part of our continuing mission to provide you with exceptional heart care, we have created designated Provider Care Teams.  These Care Teams include your primary Cardiologist (physician) and Advanced Practice Providers (APPs -  Physician Assistants and Nurse Practitioners) who all work together to provide you with the care you need, when you need it.  Your next appointment:   6 month(s)  The format for your next appointment:   In Person  Provider:   You may see [name], MD or one of the following Advanced Practice Providers on your designated Care Team:    Chanetta Marshall, NP  Tommye Standard, PA-C  Legrand Como "Oda Kilts, [place]   Other Instructions

## 2019-08-27 ENCOUNTER — Ambulatory Visit: Payer: Medicare Other | Admitting: Cardiology

## 2019-08-27 ENCOUNTER — Other Ambulatory Visit: Payer: Self-pay

## 2019-08-27 MED ORDER — PROPRANOLOL HCL 10 MG PO TABS: 10.0000 mg | ORAL_TABLET | ORAL | 1 refills | Status: DC | PRN

## 2019-09-03 MED ORDER — PROPRANOLOL HCL 10 MG PO TABS: 10.0000 mg | ORAL_TABLET | ORAL | 10 refills | Status: DC | PRN

## 2019-09-07 ENCOUNTER — Encounter: Payer: Self-pay | Admitting: Podiatry

## 2019-09-08 ENCOUNTER — Emergency Department (HOSPITAL_COMMUNITY)
Admission: EM | Admit: 2019-09-08 | Discharge: 2019-09-09 | Disposition: A | Discharge status: Home / Self Care | Payer: No Typology Code available for payment source | Attending: Emergency Medicine | Admitting: Emergency Medicine

## 2019-09-08 ENCOUNTER — Other Ambulatory Visit: Payer: Self-pay

## 2019-09-08 DIAGNOSIS — Z7984 Long term (current) use of oral hypoglycemic drugs: Secondary | ICD-10-CM | POA: Insufficient documentation

## 2019-09-08 DIAGNOSIS — Z87891 Personal history of nicotine dependence: Secondary | ICD-10-CM | POA: Diagnosis not present

## 2019-09-08 DIAGNOSIS — D649 Anemia, unspecified: Secondary | ICD-10-CM | POA: Diagnosis not present

## 2019-09-08 DIAGNOSIS — Z96652 Presence of left artificial knee joint: Secondary | ICD-10-CM | POA: Insufficient documentation

## 2019-09-08 DIAGNOSIS — I110 Hypertensive heart disease with heart failure: Secondary | ICD-10-CM | POA: Insufficient documentation

## 2019-09-08 DIAGNOSIS — Z79899 Other long term (current) drug therapy: Secondary | ICD-10-CM | POA: Insufficient documentation

## 2019-09-08 DIAGNOSIS — J449 Chronic obstructive pulmonary disease, unspecified: Secondary | ICD-10-CM | POA: Insufficient documentation

## 2019-09-08 DIAGNOSIS — R0602 Shortness of breath: Secondary | ICD-10-CM | POA: Diagnosis not present

## 2019-09-08 DIAGNOSIS — R079 Chest pain, unspecified: Secondary | ICD-10-CM | POA: Insufficient documentation

## 2019-09-08 DIAGNOSIS — E1140 Type 2 diabetes mellitus with diabetic neuropathy, unspecified: Secondary | ICD-10-CM | POA: Insufficient documentation

## 2019-09-08 DIAGNOSIS — Z20822 Contact with and (suspected) exposure to covid-19: Secondary | ICD-10-CM | POA: Diagnosis not present

## 2019-09-08 DIAGNOSIS — Z7982 Long term (current) use of aspirin: Secondary | ICD-10-CM | POA: Diagnosis not present

## 2019-09-08 DIAGNOSIS — I5032 Chronic diastolic (congestive) heart failure: Secondary | ICD-10-CM | POA: Insufficient documentation

## 2019-09-08 LAB — CBG MONITORING, ED: Glucose-Capillary: 205 mg/dL — ABNORMAL HIGH (ref 70–99)

## 2019-09-08 MED ORDER — SODIUM CHLORIDE 0.9% FLUSH
3.0000 mL | Freq: Once | INTRAVENOUS | Status: AC
  Administered 2019-09-09: 3 mL via INTRAVENOUS

## 2019-09-08 NOTE — ED Triage Notes (Signed)
Pt arrived via [facility]; pt fm hm with c/o SOB that started approx four hrs ago; pt then began having substernal CP, dull and non radiating; ; pt states that he normally uses bipap when he feels this way but today it did not improve how he felt. Pt self medicated 324mg  of ASA; pt rec'd 1 nitro enroute. 120/74; 84; 95% on 2 L [place]; CBG 245; 97.6; pt states pain decreased from 7/10 to 2/10

## 2019-09-08 NOTE — ED Provider Notes (Signed)
[facility] EMERGENCY DEPARTMENT Provider Note   CSN: [id number] Arrival date & time:      History Chief Complaint  Patient presents with  . Chest Pain  . Shortness of Breath    Lawrence [name] is a 64 y.o. male.  The history is provided by the patient.  He has history of hypertension, diabetes, hyperlipidemia, COPD, diastolic heart failure, morbid obesity and comes in because of left-sided chest pain this evening.  Pain was initially dull, and then became sharp.  There is associated dyspnea but no nausea or diaphoresis.  There is no radiation of pain.  He tried hooking himself up to BiPAP, but got no relief from his pain.  He took 4 baby aspirin which gave some partial relief.  EMS gave nitroglycerin which gave additional relief.  He denies cough or fever.  He denies exposure to COVID-19.  He has had similar pains in the past without a specific diagnosis.  There is a family history of premature coronary atherosclerosis.  He is a former smoker having quit 3 years ago.  Past Medical History:  Diagnosis Date  . Adenomatous colon polyp 2007  . Anemia   . Anxiety   . Arthritis   . Asthma   . Chronic cough   . Chronic diastolic CHF (congestive heart failure) ([place])   . COPD (chronic obstructive pulmonary disease) ([place])   . Depression   . History of cardiac cath    a.  01/2018 with angiographically minimal coronary disease with  up to 40% prox LAD and 20% mid LCx and OM2 with normal LVF and normal LVEDP..  . Hypertension   . Morbid obesity ([place])   . New onset type 2 diabetes mellitus ([place]) 11/08/2017  . PTSD (post-traumatic stress disorder)   . Sleep apnea    CPAP    Patient Active Problem List   Diagnosis Date Noted  . Sinus tachycardia 03/07/2019  . Chronic venous stasis 03/07/2019  . QT prolongation 03/07/2019  . History of anemia 12/26/2018  . Anemia 08/24/2018  . History of colon polyps 08/24/2018  . Do not resuscitate status 08/24/2018  . Chronic  diastolic CHF (congestive heart failure) ([place]) 08/18/2018  . Leukocytosis 08/18/2018  . Bilateral lower leg cellulitis   . Coronary artery calcification seen on CAT scan   . Pure hypercholesterolemia   . Chest pain   . Shortness of breath   . Acute respiratory failure with hypoxia ([place]) 01/13/2018  . Right foot pain 01/13/2018  . Acute on chronic diastolic CHF (congestive heart failure) ([place]) 01/13/2018  . Diabetes mellitus type 2 in obese ([place]) 01/13/2018  . New onset type 2 diabetes mellitus ([place]) 11/08/2017  . Cellulitis 11/07/2017  . Primary osteoarthritis, left shoulder 03/05/2017  . Essential hypertension, benign 02/22/2017  . BPH without obstruction/lower urinary tract symptoms 02/22/2017  . Hematuria 02/22/2017  . Peripheral neuropathy 02/22/2017  . Physical deconditioning 02/22/2017  . Morbid obesity ([place])   . PTSD (post-traumatic stress disorder)   . Anasarca 01/31/2017  . Bilateral lower extremity edema 01/31/2017  . Cellulitis of lower extremity   . OSA (obstructive sleep apnea)     Past Surgical History:  Procedure Laterality Date  . JOINT REPLACEMENT     left knee replacement x 2  . KNEE ARTHROSCOPY Bilateral   . LEFT HEART CATH AND CORONARY ANGIOGRAPHY N/A 01/17/2018   Procedure: LEFT HEART CATH AND CORONARY ANGIOGRAPHY;  Surgeon: Leonie Man, MD;  Location: [facility];  Service:  Cardiovascular;  Laterality: N/A;  . UMBILICAL HERNIA REPAIR         Family History  Problem Relation Age of Onset  . CAD Maternal Grandfather   . Diabetes Other   . Diabetes Mellitus II Neg Hx   . Colon cancer Neg Hx   . Esophageal cancer Neg Hx   . Inflammatory bowel disease Neg Hx   . Liver disease Neg Hx   . Pancreatic cancer Neg Hx   . Rectal cancer Neg Hx   . Stomach cancer Neg Hx     Social History   Tobacco Use  . Smoking status: Former Smoker    Quit date: 03/07/2016    Years since quitting: 3.5  . Smokeless tobacco: Never Used  Substance Use  Topics  . Alcohol use: Yes    Comment: rare  . Drug use: No    Home Medications Prior to Admission medications   Medication Sig Start Date End Date Taking? Authorizing Provider  acetaminophen (TYLENOL) 500 MG tablet Take 500 mg by mouth 4 (four) times daily as needed for mild pain.     [provider]  allopurinol (ZYLOPRIM) 100 MG tablet Take 1 tablet (100 mg total) by mouth daily. 01/18/18   Domenic Polite, MD  ARIPiprazole (ABILIFY) 5 MG tablet Take 5 mg by mouth at bedtime.     [provider]  aspirin EC 81 MG EC tablet Take 1 tablet (81 mg total) by mouth daily. 01/19/18   Domenic Polite, MD  atorvastatin (LIPITOR) 40 MG tablet Take 1 tablet (40 mg total) by mouth daily at 6 PM. 01/18/18   Domenic Polite, MD  blood glucose meter kit and supplies Dispense based on patient and insurance preference. Use up to four times daily as directed. (FOR ICD-10 E10.9, E11.9). 11/09/17   Eugenie Filler, MD  cetirizine (ZYRTEC) 10 MG tablet Take 10 mg by mouth daily.    [provider]  ferrous sulfate 324 (65 Fe) MG TBEC Take 1 tablet by mouth daily.     [provider]  fluticasone (FLONASE) 50 MCG/ACT nasal spray Place 1 spray into both nostrils daily.     [provider]  furosemide (LASIX) 80 MG tablet Take 80 mg by mouth 2 (two) times daily.    [provider]  glipiZIDE (GLUCOTROL) 5 MG tablet Take 1 tablet (5 mg total) by mouth 2 (two) times daily. 03/09/19 03/08/20  Shelly Coss, MD  lisinopril (PRINIVIL,ZESTRIL) 10 MG tablet Take 10 mg by mouth daily.    [provider]  loperamide (IMODIUM) 2 MG capsule Take 2 mg by mouth 2 (two) times a day.     [provider]  meloxicam (MOBIC) 7.5 MG tablet Take 7.5 mg by mouth daily as needed for pain.    [provider]  metFORMIN (GLUCOPHAGE) 1000 MG tablet Take 1,000 mg by mouth 2 (two) times daily with a meal.    [provider]  metoprolol succinate  (TOPROL-XL) 25 MG 24 hr tablet Take 3 tablets (75 mg total) by mouth 2 (two) times daily. Take with or immediately following a meal. 08/13/19   [name], PA-C  prazosin (MINIPRESS) 2 MG capsule Take 4 mg by mouth at bedtime.    [provider]  pregabalin (LYRICA) 100 MG capsule Take 100 mg by mouth 3 (three) times daily. 07/14/19   [provider]  propranolol (INDERAL) 10 MG tablet Take 1 tablet (10 mg total) by mouth as needed. 09/03/19  Daune Perch, NP  silver sulfADIAZINE (SILVADENE) 1 % cream Apply 1 application topically daily. Una boot (both legs)    [provider]  temazepam (RESTORIL) 15 MG capsule Take 15 mg by mouth at bedtime.     [provider]  traZODone (DESYREL) 100 MG tablet Take 200 mg by mouth at bedtime.    [provider]  Trolamine Salicylate (ASPERCREME EX) Apply 1 application topically 2 (two) times daily as needed (pain and inflammation).    [provider]  venlafaxine XR (EFFEXOR-XR) 150 MG 24 hr capsule Take 150 mg by mouth daily with breakfast.    [provider]    Allergies    Tubersol [tuberculin], Vancomycin, and Doxycycline  Review of Systems   Review of Systems  All other systems reviewed and are negative.   Physical Exam Updated Vital Signs There were no vitals taken for this visit.  Physical Exam Vitals and nursing note reviewed.   Morbidly obese 64 year old male, resting comfortably and in no acute distress. Vital signs are normal. Oxygen saturation is 93%, which is normal. Head is normocephalic and atraumatic. PERRLA, EOMI. Oropharynx is clear. Neck is nontender and supple without adenopathy or JVD. Back is nontender and there is no CVA tenderness. Lungs are clear without rales, wheezes, or rhonchi. Chest has mild tenderness over the left lower rib cage.  There is no crepitus. Heart has regular rate and rhythm without murmur. Abdomen is soft, flat, nontender without  masses or hepatosplenomegaly and peristalsis is normoactive. Extremities have 2+ edema with moderate venous stasis changes bilaterally, full range of motion is present. Skin is warm and dry without rash. Neurologic: Mental status is normal, cranial nerves are intact, there are no motor or sensory deficits.  ED Results / Procedures / Treatments   Labs (all labs ordered are listed, but only abnormal results are displayed) Labs Reviewed  BASIC METABOLIC PANEL - Abnormal; Notable for the following components:      Result Value   Chloride 97 (*)    Glucose, Bld 204 (*)    BUN 28 (*)    All other components within normal limits  CBC - Abnormal; Notable for the following components:   WBC 12.2 (*)    Hemoglobin 12.4 (*)    All other components within normal limits  D-DIMER, QUANTITATIVE (NOT AT [facility]) - Abnormal; Notable for the following components:   D-Dimer, Quant 0.77 (*)    All other components within normal limits  CBG MONITORING, ED - Abnormal; Notable for the following components:   Glucose-Capillary 205 (*)    All other components within normal limits  SARS CORONAVIRUS 2 (TAT 6-24 HRS)  TROPONIN I (HIGH SENSITIVITY)  TROPONIN I (HIGH SENSITIVITY)    EKG Normal sinus rhythm 81 bpm.  Prolonged PR interval.  Incomplete right bundle branch block.  Low voltage.  Borderline prolonged QTc interval.  No ST or T changes.  When compared with ECG on March 07, 2019, heart rate has decreased, QTc interval has decreased.  Radiology DG Chest 2 View  Result Date: 09/09/2019 CLINICAL DATA:  Chest pain and shortness of breath EXAM: CHEST - 2 VIEW COMPARISON:  03/07/2019 FINDINGS: Cardiac shadow is mildly enlarged but stable. The lungs are well aerated bilaterally. No focal infiltrate or sizable effusion is seen. Degenerative changes of the thoracic spine are noted. IMPRESSION: No acute abnormality noted. Electronically Signed   By: Inez Catalina M.D.   On: 09/09/2019 00:11   CT Angio Chest PE  W  and/or Wo Contrast  Result Date: 09/09/2019 CLINICAL DATA:  Shortness of breath and non radiating chest pain EXAM: CT ANGIOGRAPHY CHEST WITH CONTRAST TECHNIQUE: Multidetector CT imaging of the chest was performed using the standard protocol during bolus administration of intravenous contrast. Multiplanar CT image reconstructions and MIPs were obtained to evaluate the vascular anatomy. CONTRAST:  120m OMNIPAQUE IOHEXOL 350 MG/ML SOLN COMPARISON:  CT 08/17/2018 FINDINGS: Cardiovascular: Borderline opacification of the pulmonary arteries with respiratory motion artifact may limit detection of arterial filling defects beyond the lobar levels. No central or lobar filling defects are clearly identified. Central pulmonary arteries are normal caliber. There is stable cardiomegaly to comparison exams. No pericardial effusion. Extensive coronary artery calcifications are again noted as well as stenting of the LAD. Calcifications are also present on the aortic leaflets. Thoracic aorta is normal caliber. Shared origin of the brachiocephalic and left common carotid arteries noted arising from the arch. Mediastinum/Nodes: Abundant mediastinal fat is again seen. No mediastinal fluid or gas. No mediastinal, hilar or axillary adenopathy. Lungs/Pleura: There are extensive atelectatic changes throughout both lungs. These are likely accentuated due to imaging during exhalation for the angiographic technique. More bandlike areas of opacity likely reflect subsegmental atelectasis and/or scarring. No focal consolidative opacity is seen. Some apical centrilobular and paraseptal emphysematous changes are present. No pneumothorax or effusion. Upper Abdomen: No acute abnormalities present in the visualized portions of the upper abdomen. Musculoskeletal: No acute osseous abnormality or suspicious osseous lesion. Multilevel degenerative changes are present in the imaged portions of the spine. Review of the MIP images confirms the above  findings. IMPRESSION: 1. Borderline opacification of the pulmonary arteries with respiratory motion artifact may limit detection of arterial filling defects beyond the lobar levels. No central or lobar filling defects are clearly identified. 2. Atelectatic changes in the lungs without focal consolidation. 3. Emphysema (ICD10-J43.9). 4.  Aortic Atherosclerosis (ICD10-I70.0). Electronically Signed   By: PLovena LeM.D.   On: 09/09/2019 01:47    Procedures Procedures   Medications Ordered in ED Medications  sodium chloride flush (NS) 0.9 % injection 3 mL (3 mLs Intravenous Given 09/09/19 0004)  iohexol (OMNIPAQUE) 350 MG/ML injection 100 mL (100 mLs Intravenous Contrast Given 09/09/19 0119)    ED Course  I have reviewed the triage vital signs and the nursing notes.  Pertinent labs & imaging results that were available during my care of the patient were reviewed by me and considered in my medical decision making (see chart for details).  MDM Rules/Calculators/A&P Chest pain of uncertain cause.  ECG is unchanged from baseline and chest x-ray shows no acute process.  Old records were reviewed, and he is noted to have had a cardiac catheterization on January 17, 2018 which showed a 40% stenosis in the proximal LAD, 20% stent stenosis in the proximal circumflex and second marginal arteries.  Pain tonight is somewhat atypical and, given relatively modest coronary artery disease less than 2 years ago, ACS is unlikely.  Will check screening labs including D-dimer.  Labs show mild anemia which is unchanged from baseline.  Troponin is normal but D-dimer is slightly elevated.  Will send for CT angiogram of the chest.  Patient also tells me that he just remembered that a home health aide had been in his home recently who had tested positive for COVID-19.  He is requesting COVID-19 test to be done and this is sent.  ED work-up is unremarkable.  CT scan chest showed no evidence of pulmonary embolism or pneumonia.  Troponin is normal x2.  He is felt to be safe for discharge.  He is to follow[facility] with [facility], but return to emergency department for worsening symptoms.  Lawrence [name] was evaluated in Emergency Department on 09/09/2019 for the symptoms described in the history of present illness. He was evaluated in the context of the global COVID-19 pandemic, which necessitated consideration that the patient might be at risk for infection with the SARS-CoV-2 virus that causes COVID-19. Institutional protocols and algorithms that pertain to the evaluation of patients at risk for COVID-19 are in a state of rapid change based on information released by regulatory bodies including the CDC and federal and state organizations. These policies and algorithms were followed during the patient's care in the ED.  Final Clinical Impression(s) / ED Diagnoses Final diagnoses:  Nonspecific chest pain  Exposure to COVID-19 virus  Normochromic normocytic anemia    Rx / DC Orders ED Discharge Orders    None       Delora Fuel, MD 36/46/80 [id number]

## 2019-09-09 ENCOUNTER — Emergency Department (HOSPITAL_COMMUNITY): Payer: No Typology Code available for payment source

## 2019-09-09 ENCOUNTER — Other Ambulatory Visit: Payer: Self-pay | Admitting: Cardiology

## 2019-09-09 LAB — BASIC METABOLIC PANEL
Anion gap: 12 (ref 5–15)
BUN: 28 mg/dL — ABNORMAL HIGH (ref 8–23)
CO2: 27 mmol/L (ref 22–32)
Calcium: 9.1 mg/dL (ref 8.9–10.3)
Chloride: 97 mmol/L — ABNORMAL LOW (ref 98–111)
Creatinine, Ser: 0.89 mg/dL (ref 0.61–1.24)
GFR calc Af Amer: >60 mL/min (ref >60)
GFR calc non Af Amer: >60 mL/min (ref >60)
Glucose, Bld: 204 mg/dL — ABNORMAL HIGH (ref 70–99)
Potassium: 4.3 mmol/L (ref 3.5–5.1)
Sodium: 136 mmol/L (ref 135–145)

## 2019-09-09 LAB — CBC
HCT: 39.5 % (ref 39.0–52.0)
Hemoglobin: 12.4 g/dL — ABNORMAL LOW (ref 13.0–17.0)
MCH: 28.7 pg (ref 26.0–34.0)
MCHC: 31.4 g/dL (ref 30.0–36.0)
MCV: 91.4 fL (ref 80.0–100.0)
Platelets: 255 10*3/uL (ref 150–400)
RBC: 4.32 MIL/uL (ref 4.22–5.81)
RDW: 14.2 % (ref 11.5–15.5)
WBC: 12.2 10*3/uL — ABNORMAL HIGH (ref 4.0–10.5)
nRBC: 0 % (ref 0.0–0.2)

## 2019-09-09 LAB — TROPONIN I (HIGH SENSITIVITY)
Troponin I (High Sensitivity): 3 ng/L (ref <18)
Troponin I (High Sensitivity): 3 ng/L (ref <18)

## 2019-09-09 LAB — D-DIMER, QUANTITATIVE: D-Dimer, Quant: 0.77 ug/mL-FEU — ABNORMAL HIGH (ref 0.00–0.50)

## 2019-09-09 LAB — SARS CORONAVIRUS 2 (TAT 6-24 HRS): SARS Coronavirus 2: NEGATIVE

## 2019-09-09 MED ORDER — IOHEXOL 350 MG/ML SOLN
100.0000 mL | Freq: Once | INTRAVENOUS | Status: AC | PRN
  Administered 2019-09-09: 100 mL via INTRAVENOUS

## 2019-09-09 NOTE — ED Notes (Signed)
Pt discharged from ED; instructions provided and scripts given; Pt encouraged to return to ED if symptoms worsen and to f/u with PCP; Pt verbalized understanding of all instructions 

## 2019-09-09 NOTE — Discharge Instructions (Addendum)
Your tests today did not show any sign of any heart or lung problem.  Take ibuprofen or acetaminophen as needed for pain.  You had a test for COVID-19  done, results should be available sometime on Monday. You can check those results on MyChart.  Return if symptoms are getting worse.

## 2019-09-11 ENCOUNTER — Ambulatory Visit (INDEPENDENT_AMBULATORY_CARE_PROVIDER_SITE_OTHER): Payer: Medicare Other | Admitting: Podiatry

## 2019-09-11 ENCOUNTER — Encounter: Payer: Self-pay | Admitting: Podiatry

## 2019-09-11 ENCOUNTER — Other Ambulatory Visit: Payer: Self-pay

## 2019-09-11 DIAGNOSIS — B351 Tinea unguium: Secondary | ICD-10-CM | POA: Insufficient documentation

## 2019-09-11 DIAGNOSIS — E1142 Type 2 diabetes mellitus with diabetic polyneuropathy: Secondary | ICD-10-CM | POA: Diagnosis not present

## 2019-09-11 DIAGNOSIS — E119 Type 2 diabetes mellitus without complications: Secondary | ICD-10-CM | POA: Insufficient documentation

## 2019-09-11 DIAGNOSIS — E1159 Type 2 diabetes mellitus with other circulatory complications: Secondary | ICD-10-CM

## 2019-09-11 DIAGNOSIS — Z794 Long term (current) use of insulin: Secondary | ICD-10-CM | POA: Insufficient documentation

## 2019-09-11 DIAGNOSIS — M79675 Pain in left toe(s): Secondary | ICD-10-CM

## 2019-09-11 DIAGNOSIS — M79674 Pain in right toe(s): Secondary | ICD-10-CM

## 2019-09-11 DIAGNOSIS — E1140 Type 2 diabetes mellitus with diabetic neuropathy, unspecified: Secondary | ICD-10-CM

## 2019-09-11 DIAGNOSIS — I878 Other specified disorders of veins: Secondary | ICD-10-CM

## 2019-09-11 DIAGNOSIS — E1169 Type 2 diabetes mellitus with other specified complication: Secondary | ICD-10-CM | POA: Insufficient documentation

## 2019-09-11 HISTORY — DX: Type 2 diabetes mellitus with other circulatory complications: E11.59

## 2019-09-11 HISTORY — DX: Pain in right toe(s): M79.674

## 2019-09-11 HISTORY — DX: Tinea unguium: B35.1

## 2019-09-11 HISTORY — DX: Type 2 diabetes mellitus with diabetic neuropathy, unspecified: E11.40

## 2019-09-11 NOTE — Progress Notes (Signed)
This patient presents to the office with chief complaint of long thick nails and diabetic feet.  This patient  says there  is  no pain and discomfort in their feet.  This patient says there are long thick painful nails.  These nails are painful walking and wearing shoes.  Patient has no history of infection or drainage from both feet.  Patient is unable to  self treat his own nails . This patient presents  to the office today for treatment of the  long nails and a foot evaluation due to history of  Diabetes. Patient presents to the office with his wife.    General Appearance  Alert, conversant and in no acute stress.  Vascular  Dorsalis pedis and posterior tibial  pulses are not  palpable  Bilaterally  Due to severe leg/foot swelling.  .  Capillary return is within normal limits  bilaterally. Temperature is within normal limits  bilaterally.  Neurologic  Senn-Weinstein monofilament wire test absent right foot.  LOPS left foot diminished.   Muscle power within normal limits bilaterally.  Nails Thick disfigured discolored nails with subungual debris  from hallux to fifth toes bilaterally. No evidence of bacterial infection or drainage bilaterally.  Orthopedic  No limitations of motion of motion feet .  No crepitus or effusions noted.  No bony pathology or digital deformities noted. HAV  B/L.  Skin  normotropic skin with no porokeratosis noted bilaterally.  No signs of infections or ulcers noted.     Onychomycosis  Diabetes with no foot complications  IE  Debride nails x 10.  A diabetic foot exam was performed and there is  evidence of any vascular or neurologic pathology.   RTC 3 months.   Gardiner Barefoot DPM

## 2019-09-23 NOTE — Progress Notes (Addendum)
Cardiology Office Note:    Date:  09/24/2019   ID:  Lawrence [name], DOB 1955/08/22, MRN [id number]  PCP:  [facility]  Cardiologist:  Fransico Him, MD    Referring MD: [facility]   Chief Complaint  Patient presents with  . Coronary Artery Disease  . Hyperlipidemia  . Hypertension  . Congestive Heart Failure  . Sleep Apnea    History of Present Illness:    Lawrence [name] is a 64 y.o. male with a hx of nonobstructive coronary artery disease, hypertension, hyperlipidemia, diastolic heart failure, OSA on BiPAP, super morbid obesity, depression, anxiety.  He is a DNR.  He was seen in the hospital 01/25/2018 for evaluation of chest pain and shortness of breath.  He was thought to be a poor candidate for ischemic evaluation due to his obesity.  Troponins were negative at this time.  Left heart catheterization was subsequently performed which showed minimal coronary artery disease.  He presented the emergency room 02/19/2019 with palpitations.  No arrhythmias were noted.  He wore a ZIO patch which showed multiple episodes of SVT. He was seen by EP and Toprol was increased to 57m BID.  He was seen back by RVick Frees PA and has not had any further palpitations.    Mr. HOfferdahlis morbidly obese.  His activity level is significantly impaired.  He uses a walker at home and a motorized chair when out.  He has chronic lower extremity edema.  [facility] has not been told that he has renal insufficiency.  He says he is followed due to his diabetes.  His A1c is high.   He is here today for followup and is doing well.  He denies any anginal chest pain or pressure,  PND, orthopnea, dizziness, palpitations or syncope. He has chronic DOE due to supra morbid obesity which is stable.  He still has occasional racing of his heart beat but much improved on higher dose of BB.  He has chronic LE edema from chronic venous insuff which is stable.  He continues  to have a pin point pain over his left breast that he had at the time of his cath which showed non obstructive CAD - suspect this is MSK pain.  He is compliant with his meds and is tolerating meds with no SE.    Past Medical History:  Diagnosis Date  . Acute on chronic diastolic CHF (congestive heart failure) ([place] 01/13/2018  . Acute respiratory failure with hypoxia ([facility] 01/13/2018  . Adenomatous colon polyp 2007  . Anasarca 01/31/2017  . Anemia   . Anxiety   . Arthritis   . Asthma   . Bilateral lower extremity edema 01/31/2017  . Bilateral lower leg cellulitis   . BPH without obstruction/lower urinary tract symptoms 02/22/2017  . Cellulitis 11/07/2017  . Cellulitis of lower extremity   . Chest pain   . Chronic cough   . Chronic diastolic CHF (congestive heart failure) ([facility] [place]   . Chronic venous stasis 03/07/2019  . COPD (ch[place] obstructive pulmonary disease) ([facility] [place]   . Coronary artery calcification seen on CAT scan   . Depression   . Diabetic neuropathy ([facility] [place] 09/11/2019  . Hematuria 02/22/2017  . History of anemia 12/26/2018  . History of cardiac cath    a.  01/2018 with angiographically minimal coronary disease with  up to 40% prox LAD and 20% mid LCx and OM2 with normal LVF and normal LVEDP..[name] .[name]  History of colon polyps 08/24/2018  . Hypertension   . Leukocytosis 08/18/2018  . Morbid obesity ([place])   . New onset type 2 diabetes mellitus ([place]) 11/08/2017  . OSA (obstructive sleep apnea)   . Pain due to onychomycosis of toenails of both feet 09/11/2019  . Peripheral neuropathy 02/22/2017  . Physical deconditioning 02/22/2017  . Primary osteoarthritis, left shoulder 03/05/2017  . PTSD (post-traumatic stress disorder)   . Pure hypercholesterolemia   . QT prolongation 03/07/2019  . Right foot pain 01/13/2018  . Shortness of breath   . Sinus tachycardia 03/07/2019  . Sleep apnea    CPAP  . Type 2 diabetes mellitus with vascular disease ([place]) 09/11/2019    Past Surgical History:  Procedure Laterality  Date  . JOINT REPLACEMENT     left knee replacement x 2  . KNEE ARTHROSCOPY Bilateral   . LEFT HEART CATH AND CORONARY ANGIOGRAPHY N/A 01/17/2018   Procedure: LEFT HEART CATH AND CORONARY ANGIOGRAPHY;  Surgeon: Leonie Man, MD;  Location: [facility];  Service: Cardiovascular;  Laterality: N/A;  . UMBILICAL HERNIA REPAIR      Current Medications: Current Meds  Medication Sig  . acetaminophen (TYLENOL) 500 MG tablet Take 500 mg by mouth 4 (four) times daily as needed for mild pain.   [name] allopurinol (ZYLOPRIM) 100 MG tablet Take 1 tablet (100 mg total) by mouth daily.  . ARIPiprazole (ABILIFY) 5 MG tablet Take 5 mg by mouth at bedtime.   [name] aspirin EC 81 MG EC tablet Take 1 tablet (81 mg total) by mouth daily.  [name] atorvastatin (LIPITOR) 40 MG tablet Take 1 tablet (40 mg total) by mouth daily at 6 PM.  . blood glucose meter kit and supplies Dispense based on patient and insurance preference. Use up to four times daily as directed. (FOR ICD-10 E10.9, E11.9).  . cetirizine (ZYRTEC) 10 MG tablet Take 10 mg by mouth daily.  . colchicine 0.6 MG tablet Take 0.6 mg by mouth daily.  . ferrous sulfate 324 (65 Fe) MG TBEC Take 1 tablet by mouth daily.   . fluticasone (FLONASE) 50 MCG/ACT nasal spray Place 1 spray into both nostrils daily.   . furosemide (LASIX) 80 MG tablet Take 80 mg by mouth 2 (two) times daily.  [name] glipiZIDE (GLUCOTROL) 5 MG tablet Take 1 tablet (5 mg total) by mouth 2 (two) times daily.  [name] lisinopril (PRINIVIL,ZESTRIL) 10 MG tablet Take 10 mg by mouth daily.  [name] loperamide (IMODIUM) 2 MG capsule Take 2 mg by mouth 2 (two) times a day.   . meloxicam (MOBIC) 7.5 MG tablet Take 7.5 mg by mouth daily as needed for pain.  . metFORMIN (GLUCOPHAGE) 1000 MG tablet Take 1,000 mg by mouth 2 (two) times daily with a meal.  . metoprolol succinate (TOPROL-XL) 25 MG 24 hr tablet Take 3 tablets (75 mg total) by mouth 2 (two) times daily. Take with or immediately following a meal.  .  prazosin (MINIPRESS) 2 MG capsule Take 4 mg by mouth at bedtime.  . pregabalin (LYRICA) 100 MG capsule Take 100 mg by mouth 3 (three) times daily.  . propranolol (INDERAL) 10 MG tablet Take 1 tablet (10 mg total) by mouth as needed.  . silver sulfADIAZINE (SILVADENE) 1 % cream Apply 1 application topically daily. Una boot (both legs)  . temazepam (RESTORIL) 15 MG capsule Take 15 mg by mouth at bedtime.   . traZODone (DESYREL) 100 MG tablet Take 200 mg by mouth at bedtime.  [name]  Trolamine Salicylate (ASPERCREME EX) Apply 1 application topically 2 (two) times daily as needed (pain and inflammation).  . venlafaxine XR (EFFEXOR-XR) 150 MG 24 hr capsule Take 150 mg by mouth daily with breakfast.     Allergies:   Tubersol [tuberculin], Vancomycin, and Doxycycline   Social History   Socioeconomic History  . Marital status: Single    Spouse name: Not on file  . Number of children: 1  . Years of education: Not on file  . Highest education level: Not on file  Occupational History  . Occupation: retired  Tobacco Use  . Smoking status: Former Smoker    Quit date: 03/07/2016    Years since quitting: 3.5  . Smokeless tobacco: Never Used  Substance and Sexual Activity  . Alcohol use: Yes    Comment: rare  . Drug use: No  . Sexual activity: Not on file  Other Topics Concern  . Not on file  Social History Narrative  . Not on file   Social Determinants of Health   Financial Resource Strain:   . Difficulty of Paying Living Expenses: Not on file  Food Insecurity:   . Worried About Charity fundraiser in the Last Year: Not on file  . Ran Out of Food in the Last Year: Not on file  Transportation Needs:   . Lack of Transportation (Medical): Not on file  . Lack of Transportation (Non-Medical): Not on file  Physical Activity:   . Days of Exercise per Week: Not on file  . Minutes of Exercise per Session: Not on file  Stress:   . Feeling of Stress : Not on file  Social Connections:   .  Frequency of Communication with Friends and Family: Not on file  . Frequency of Social Gatherings with Friends and Family: Not on file  . Attends Religious Services: Not on file  . Active Member of Clubs or Organizations: Not on file  . Attends Archivist Meetings: Not on file  . Marital Status: Not on file     Family History: The patient's family history includes CAD in his maternal grandfather; Diabetes in an other family member. There is no history of Diabetes Mellitus II, Colon cancer, Esophageal cancer, Inflammatory bowel disease, Liver disease, Pancreatic cancer, Rectal cancer, or Stomach cancer.  ROS:   Please see the history of present illness.    ROS  All other systems reviewed and negative.   EKGs/Labs/Other Studies Reviewed:    The following studies were reviewed today: PAP compliance download from [facility], outside labs   EKG:  EKG is not ordered today.  Recent Labs: 02/19/2019: ALT 26; B Natriuretic Peptide 38.6 03/07/2019: Magnesium 1.8; TSH 1.648 09/08/2019: BUN 28; Creatinine, Ser 0.89; Hemoglobin 12.4; Platelets 255; Potassium 4.3; Sodium 136   Recent Lipid Panel    Component Value Date/Time   CHOL 149 08/18/2018 0144   TRIG 174 (H) 08/18/2018 0144   HDL 36 (L) 08/18/2018 0144   CHOLHDL 4.1 08/18/2018 0144   VLDL 35 08/18/2018 0144   LDLCALC 78 08/18/2018 0144    Physical Exam:    VS:  BP 122/64   Pulse 81   Ht '6\' 2"'$  (1.88 m)   Wt (!) 400 lb (181.4 kg) Comment: Pt stated weight/couldnt stand on scale  SpO2 91%   BMI 51.36 kg/m     Wt Readings from Last 3 Encounters:  09/24/19 (!) 400 lb (181.4 kg)  09/08/19 (!) 400 lb (181.4 kg)  08/13/19 (!) 417 lb (189.1  kg)     GEN:  Well nourished, well developed in no acute distress HEENT: Normal NECK: No JVD; No carotid bruits LYMPHATICS: No lymphadenopathy CARDIAC: RRR, no murmurs, rubs, gallops RESPIRATORY:  Clear to auscultation without rales, wheezing or rhonchi  ABDOMEN: Soft, non-tender,  non-distended MUSCULOSKELETAL:  No edema; No deformity  SKIN: Warm and dry NEUROLOGIC:  Alert and oriented x 3 PSYCHIATRIC:  Normal affect   ASSESSMENT:    1. SVT (supraventricular tachycardia) ([place])   2. Coronary artery calcification seen on CAT scan   3. Essential hypertension, benign   4. Morbid obesity ([place])   5. Pure hypercholesterolemia   6. Type 2 diabetes mellitus with vascular disease ([place])    PLAN:    In order of problems listed above:  1.  SVT  -followed by EP -palpitations have improved after BB increased to Toprol XL 41m BID -occasionally he will still have some racing but nothing like before  2.  ASCAD -minimal coronary artery disease by cath 2019 -denies any anginal CP -continue ASA, BB and statin  3.  HTN -BP controlled on exam -continue Lisinopril 159mdaily, prazosin 54m10mhs and Toprol XL 66m59mD -outside labs reviewed on [facility] showing a Creatinine of 0.89 and K+ of 4.3.  4.  Morbid Obesity  -I have encouraged him to cut back on carbs and portions.   5.  HLD -LDL goal < 70 -LDL was 61 in Aug 2020 on review of outside PCP labs on [facility] -continue atorvastatin 40mg51mly  6.  DM type 2 -followed by PCP -HbA1C was 9.4% in July 2020 from outside PCP on [facility] -continue glipizide 5mg B654mand Metformin 1000mg B554mcontinue statin and ACE I    Medication Adjustments/Labs and Tests Ordered: Current medicines are reviewed at length with the patient today.  Concerns regarding medicines are outlined above.  No orders of the defined types were placed in this encounter.  No orders of the defined types were placed in this encounter.   Signed, Prescilla Monger TFransico Him/16/2021 10:33 AM    [facility]

## 2019-09-24 ENCOUNTER — Ambulatory Visit (INDEPENDENT_AMBULATORY_CARE_PROVIDER_SITE_OTHER): Payer: Medicare Other | Admitting: Cardiology

## 2019-09-24 ENCOUNTER — Encounter: Payer: Self-pay | Admitting: Cardiology

## 2019-09-24 ENCOUNTER — Other Ambulatory Visit: Payer: Self-pay

## 2019-09-24 VITALS — BP 122/64 | HR 81 | Ht 74.0 in | Wt >= 6400 oz

## 2019-09-24 DIAGNOSIS — I2510 Atherosclerotic heart disease of native coronary artery without angina pectoris: Secondary | ICD-10-CM

## 2019-09-24 DIAGNOSIS — I471 Supraventricular tachycardia, unspecified: Secondary | ICD-10-CM

## 2019-09-24 DIAGNOSIS — E1159 Type 2 diabetes mellitus with other circulatory complications: Secondary | ICD-10-CM

## 2019-09-24 DIAGNOSIS — I10 Essential (primary) hypertension: Secondary | ICD-10-CM | POA: Diagnosis not present

## 2019-09-24 DIAGNOSIS — E7800 Pure hypercholesterolemia, unspecified: Secondary | ICD-10-CM

## 2019-09-24 MED ORDER — FUROSEMIDE 80 MG PO TABS: 80.0000 mg | ORAL_TABLET | Freq: Two times a day (BID) | ORAL | 3 refills | Status: DC

## 2019-09-24 MED ORDER — PROPRANOLOL HCL 10 MG PO TABS: 10.0000 mg | ORAL_TABLET | ORAL | 10 refills | Status: DC | PRN

## 2019-09-24 MED ORDER — ATORVASTATIN CALCIUM 40 MG PO TABS: 40.0000 mg | ORAL_TABLET | Freq: Every day | ORAL | 3 refills | Status: DC

## 2019-09-24 MED ORDER — LISINOPRIL 10 MG PO TABS: 10.0000 mg | ORAL_TABLET | Freq: Every day | ORAL | 3 refills | Status: DC

## 2019-09-24 NOTE — Patient Instructions (Signed)
Medication Instructions:  Your physician recommends that you continue on your current medications as directed. Please refer to the Current Medication list given to you today.  *If you need a refill on your cardiac medications before your next appointment, please call your pharmacy*  Follow-Up: At [facility], you and your health needs are our priority.  As part of our continuing mission to provide you with exceptional heart care, we have created designated Provider Care Teams.  These Care Teams include your primary Cardiologist (physician) and Advanced Practice Providers (APPs -  Physician Assistants and Nurse Practitioners) who all work together to provide you with the care you need, when you need it.  Your next appointment:   1 year(s)  The format for your next appointment:   In Person  Provider:   You may see Traci Turner, MD or one of the following Advanced Practice Providers on your designated Care Team:    Dayna Dunn, PA-C  Michele Lenze, PA-C   

## 2019-11-20 ENCOUNTER — Emergency Department (HOSPITAL_COMMUNITY)
Admission: EM | Admit: 2019-11-20 | Discharge: 2019-11-20 | Disposition: A | Discharge status: Home / Self Care | Payer: Medicare Other | Attending: Emergency Medicine | Admitting: Emergency Medicine

## 2019-11-20 ENCOUNTER — Emergency Department (HOSPITAL_COMMUNITY): Payer: Medicare Other

## 2019-11-20 ENCOUNTER — Other Ambulatory Visit: Payer: Self-pay

## 2019-11-20 ENCOUNTER — Encounter (HOSPITAL_COMMUNITY): Payer: Self-pay | Admitting: Emergency Medicine

## 2019-11-20 DIAGNOSIS — I5033 Acute on chronic diastolic (congestive) heart failure: Secondary | ICD-10-CM | POA: Diagnosis not present

## 2019-11-20 DIAGNOSIS — E119 Type 2 diabetes mellitus without complications: Secondary | ICD-10-CM | POA: Insufficient documentation

## 2019-11-20 DIAGNOSIS — R609 Edema, unspecified: Secondary | ICD-10-CM

## 2019-11-20 DIAGNOSIS — Z87891 Personal history of nicotine dependence: Secondary | ICD-10-CM | POA: Insufficient documentation

## 2019-11-20 DIAGNOSIS — L03119 Cellulitis of unspecified part of limb: Secondary | ICD-10-CM

## 2019-11-20 DIAGNOSIS — I110 Hypertensive heart disease with heart failure: Secondary | ICD-10-CM | POA: Insufficient documentation

## 2019-11-20 DIAGNOSIS — J449 Chronic obstructive pulmonary disease, unspecified: Secondary | ICD-10-CM | POA: Insufficient documentation

## 2019-11-20 DIAGNOSIS — Z79899 Other long term (current) drug therapy: Secondary | ICD-10-CM | POA: Diagnosis not present

## 2019-11-20 DIAGNOSIS — L03116 Cellulitis of left lower limb: Secondary | ICD-10-CM | POA: Insufficient documentation

## 2019-11-20 DIAGNOSIS — R0602 Shortness of breath: Secondary | ICD-10-CM | POA: Diagnosis present

## 2019-11-20 DIAGNOSIS — Z7982 Long term (current) use of aspirin: Secondary | ICD-10-CM | POA: Diagnosis not present

## 2019-11-20 DIAGNOSIS — L03115 Cellulitis of right lower limb: Secondary | ICD-10-CM | POA: Insufficient documentation

## 2019-11-20 DIAGNOSIS — R600 Localized edema: Secondary | ICD-10-CM

## 2019-11-20 LAB — CBC WITH DIFFERENTIAL/PLATELET
Abs Immature Granulocytes: 0.11 10*3/uL — ABNORMAL HIGH (ref 0.00–0.07)
Basophils Absolute: 0.1 10*3/uL (ref 0.0–0.1)
Basophils Relative: 1 %
Eosinophils Absolute: 0.5 10*3/uL (ref 0.0–0.5)
Eosinophils Relative: 4 %
HCT: 40.6 % (ref 39.0–52.0)
Hemoglobin: 13.1 g/dL (ref 13.0–17.0)
Immature Granulocytes: 1 %
Lymphocytes Relative: 13 %
Lymphs Abs: 1.6 10*3/uL (ref 0.7–4.0)
MCH: 29.2 pg (ref 26.0–34.0)
MCHC: 32.3 g/dL (ref 30.0–36.0)
MCV: 90.4 fL (ref 80.0–100.0)
Monocytes Absolute: 0.9 10*3/uL (ref 0.1–1.0)
Monocytes Relative: 7 %
Neutro Abs: 9.6 10*3/uL — ABNORMAL HIGH (ref 1.7–7.7)
Neutrophils Relative %: 74 %
Platelets: 256 10*3/uL (ref 150–400)
RBC: 4.49 MIL/uL (ref 4.22–5.81)
RDW: 14.8 % (ref 11.5–15.5)
WBC: 12.8 10*3/uL — ABNORMAL HIGH (ref 4.0–10.5)
nRBC: 0 % (ref 0.0–0.2)

## 2019-11-20 LAB — COMPREHENSIVE METABOLIC PANEL
ALT: 24 U/L (ref 0–44)
AST: 16 U/L (ref 15–41)
Albumin: 3.8 g/dL (ref 3.5–5.0)
Alkaline Phosphatase: 70 U/L (ref 38–126)
Anion gap: 11 (ref 5–15)
BUN: 24 mg/dL — ABNORMAL HIGH (ref 8–23)
CO2: 28 mmol/L (ref 22–32)
Calcium: 8.8 mg/dL — ABNORMAL LOW (ref 8.9–10.3)
Chloride: 96 mmol/L — ABNORMAL LOW (ref 98–111)
Creatinine, Ser: 0.89 mg/dL (ref 0.61–1.24)
GFR calc Af Amer: >60 mL/min (ref >60)
GFR calc non Af Amer: >60 mL/min (ref >60)
Glucose, Bld: 157 mg/dL — ABNORMAL HIGH (ref 70–99)
Potassium: 4 mmol/L (ref 3.5–5.1)
Sodium: 135 mmol/L (ref 135–145)
Total Bilirubin: 0.2 mg/dL — ABNORMAL LOW (ref 0.3–1.2)
Total Protein: 6.9 g/dL (ref 6.5–8.1)

## 2019-11-20 LAB — PROTIME-INR
INR: 0.9 (ref 0.8–1.2)
Prothrombin Time: 11.9 seconds (ref 11.4–15.2)

## 2019-11-20 LAB — URINALYSIS, ROUTINE W REFLEX MICROSCOPIC
Bilirubin Urine: NEGATIVE
Glucose, UA: NEGATIVE mg/dL
Hgb urine dipstick: NEGATIVE
Ketones, ur: NEGATIVE mg/dL
Nitrite: NEGATIVE
Protein, ur: NEGATIVE mg/dL
Specific Gravity, Urine: 1.01 (ref 1.005–1.030)
pH: 5 (ref 5.0–8.0)

## 2019-11-20 LAB — LACTIC ACID, PLASMA: Lactic Acid, Venous: 1.8 mmol/L (ref 0.5–1.9)

## 2019-11-20 MED ORDER — CEPHALEXIN 500 MG PO CAPS: 500.0000 mg | ORAL_CAPSULE | Freq: Three times a day (TID) | ORAL | 0 refills | Status: DC

## 2019-11-20 MED ORDER — FUROSEMIDE 10 MG/ML IJ SOLN
80.0000 mg | Freq: Once | INTRAMUSCULAR | Status: AC
  Administered 2019-11-20: 80 mg via INTRAVENOUS
  Filled 2019-11-20: qty 8

## 2019-11-20 MED ORDER — POTASSIUM CHLORIDE CRYS ER 20 MEQ PO TBCR
40.0000 meq | EXTENDED_RELEASE_TABLET | Freq: Once | ORAL | Status: AC
  Administered 2019-11-20: 40 meq via ORAL
  Filled 2019-11-20: qty 2

## 2019-11-20 MED ORDER — CEPHALEXIN 250 MG PO CAPS
500.0000 mg | ORAL_CAPSULE | Freq: Once | ORAL | Status: AC
  Administered 2019-11-20: 500 mg via ORAL
  Filled 2019-11-20: qty 2

## 2019-11-20 MED ORDER — ACETAMINOPHEN 325 MG PO TABS
650.0000 mg | ORAL_TABLET | Freq: Once | ORAL | Status: AC
  Administered 2019-11-20: 650 mg via ORAL
  Filled 2019-11-20: qty 2

## 2019-11-20 NOTE — ED Notes (Signed)
ptar called by dee pt is up next

## 2019-11-20 NOTE — ED Provider Notes (Signed)
[facility] EMERGENCY DEPARTMENT Provider Note   CSN: [id number] Arrival date & time: 11/20/19  1347     History Chief Complaint  Patient presents with  . Shortness of Breath  . Leg Swelling    Lawrence [name] is a 64 y.o. male.  HPI   Pt has been having issues with leg swelling for about a week and a half.  He set up an appointment with his doctor but the therapist who sees him recommended he be seen sooner.  The swelling has been getting worse, he has noticed fluid weeping from the legs.  He has been feeling more short of breath.  Pt does not use oxygen normally.  Pt is taking his lasix.  He still feels like he is urinating.  He has been gaining weight.  Past Medical History:  Diagnosis Date  . Acute on chronic diastolic CHF (congestive heart failure) ([place]) 01/13/2018  . Acute respiratory failure with hypoxia ([place]) 01/13/2018  . Adenomatous colon polyp 2007  . Anasarca 01/31/2017  . Anemia   . Anxiety   . Arthritis   . Asthma   . Bilateral lower extremity edema 01/31/2017  . Bilateral lower leg cellulitis   . BPH without obstruction/lower urinary tract symptoms 02/22/2017  . Cellulitis 11/07/2017  . Cellulitis of lower extremity   . Chest pain   . Chronic cough   . Chronic diastolic CHF (congestive heart failure) ([place])   . Chronic venous stasis 03/07/2019  . COPD (chronic obstructive pulmonary disease) ([place])   . Coronary artery calcification seen on CAT scan   . Depression   . Diabetic neuropathy ([place]) 09/11/2019  . Hematuria 02/22/2017  . History of anemia 12/26/2018  . History of cardiac cath    a.  01/2018 with angiographically minimal coronary disease with  up to 40% prox LAD and 20% mid LCx and OM2 with normal LVF and normal LVEDP..  . History of colon polyps 08/24/2018  . Hypertension   . Leukocytosis 08/18/2018  . Morbid obesity ([place])   . New onset type 2 diabetes mellitus ([place]) 11/08/2017  . OSA (obstructive sleep apnea)   . Pain due to onychomycosis of  toenails of both feet 09/11/2019  . Peripheral neuropathy 02/22/2017  . Physical deconditioning 02/22/2017  . Primary osteoarthritis, left shoulder 03/05/2017  . PTSD (post-traumatic stress disorder)   . Pure hypercholesterolemia   . QT prolongation 03/07/2019  . Right foot pain 01/13/2018  . Shortness of breath   . Sinus tachycardia 03/07/2019  . Sleep apnea    CPAP  . Type 2 diabetes mellitus with vascular disease ([place]) 09/11/2019    Patient Active Problem List   Diagnosis Date Noted  . Pain due to onychomycosis of toenails of both feet 09/11/2019  . Diabetic neuropathy ([place]) 09/11/2019  . Type 2 diabetes mellitus with vascular disease ([place]) 09/11/2019  . Sinus tachycardia 03/07/2019  . Chronic venous stasis 03/07/2019  . QT prolongation 03/07/2019  . History of anemia 12/26/2018  . Anemia 08/24/2018  . History of colon polyps 08/24/2018  . Do not resuscitate status 08/24/2018  . Chronic diastolic CHF (congestive heart failure) ([place]) 08/18/2018  . Leukocytosis 08/18/2018  . Bilateral lower leg cellulitis   . Coronary artery calcification seen on CAT scan   . Pure hypercholesterolemia   . Chest pain   . Shortness of breath   . Acute respiratory failure with hypoxia ([place]) 01/13/2018  . Right foot pain 01/13/2018  . Acute on chronic diastolic CHF (congestive  heart failure) ([place]) 01/13/2018  . Diabetes mellitus type 2 in obese ([place]) 01/13/2018  . New onset type 2 diabetes mellitus ([place]) 11/08/2017  . Cellulitis 11/07/2017  . Primary osteoarthritis, left shoulder 03/05/2017  . Essential hypertension, benign 02/22/2017  . BPH without obstruction/lower urinary tract symptoms 02/22/2017  . Hematuria 02/22/2017  . Peripheral neuropathy 02/22/2017  . Physical deconditioning 02/22/2017  . Morbid obesity ([place])   . PTSD (post-traumatic stress disorder)   . Anasarca 01/31/2017  . Bilateral lower extremity edema 01/31/2017  . Cellulitis of lower extremity   . OSA (obstructive sleep apnea)      Past Surgical History:  Procedure Laterality Date  . JOINT REPLACEMENT     left knee replacement x 2  . KNEE ARTHROSCOPY Bilateral   . LEFT HEART CATH AND CORONARY ANGIOGRAPHY N/A 01/17/2018   Procedure: LEFT HEART CATH AND CORONARY ANGIOGRAPHY;  Surgeon: Leonie Man, MD;  Location: [facility];  Service: Cardiovascular;  Laterality: N/A;  . UMBILICAL HERNIA REPAIR         Family History  Problem Relation Age of Onset  . CAD Maternal Grandfather   . Diabetes Other   . Diabetes Mellitus II Neg Hx   . Colon cancer Neg Hx   . Esophageal cancer Neg Hx   . Inflammatory bowel disease Neg Hx   . Liver disease Neg Hx   . Pancreatic cancer Neg Hx   . Rectal cancer Neg Hx   . Stomach cancer Neg Hx     Social History   Tobacco Use  . Smoking status: Former Smoker    Quit date: 03/07/2016    Years since quitting: 3.7  . Smokeless tobacco: Never Used  Substance Use Topics  . Alcohol use: Not Currently    Comment: rare  . Drug use: No    Home Medications Prior to Admission medications   Medication Sig Start Date End Date Taking? Authorizing Provider  acetaminophen (TYLENOL) 650 MG CR tablet Take 1,300 mg by mouth every 8 (eight) hours as needed for pain.   Yes [provider]  allopurinol (ZYLOPRIM) 100 MG tablet Take 1 tablet (100 mg total) by mouth daily. Patient taking differently: Take 200 mg by mouth daily.  01/18/18  Yes Domenic Polite, MD  ARIPiprazole (ABILIFY) 5 MG tablet Take 5 mg by mouth at bedtime.    Yes [provider]  aspirin EC 81 MG EC tablet Take 1 tablet (81 mg total) by mouth daily. 01/19/18  Yes Domenic Polite, MD  atorvastatin (LIPITOR) 40 MG tablet Take 1 tablet (40 mg total) by mouth daily at 6 PM. 09/24/19  Yes Turner, Eber Hong, MD  cetirizine (ZYRTEC) 10 MG tablet Take 10 mg by mouth at bedtime.    Yes [provider]  colchicine 0.6 MG tablet Take 0.6 mg by mouth daily. 08/27/19  Yes [provider]    ferrous sulfate 324 (65 Fe) MG TBEC Take 324 mg by mouth daily with breakfast.    Yes [provider]  fluticasone (FLONASE) 50 MCG/ACT nasal spray Place 2 sprays into both nostrils daily.    Yes [provider]  furosemide (LASIX) 80 MG tablet Take 1 tablet (80 mg total) by mouth 2 (two) times daily. 09/24/19  Yes Turner, Eber Hong, MD  glipiZIDE (GLUCOTROL) 5 MG tablet Take 1 tablet (5 mg total) by mouth 2 (two) times daily. 03/09/19 03/08/20 Yes Shelly Coss, MD  lisinopril (ZESTRIL) 10 MG tablet Take 1 tablet (10 mg  total) by mouth daily. 09/24/19  Yes Turner, Eber Hong, MD  loperamide (IMODIUM) 2 MG capsule Take 2 mg by mouth 2 (two) times a day.    Yes [provider]  meloxicam (MOBIC) 7.5 MG tablet Take 7.5 mg by mouth daily as needed for pain.   Yes [provider]  metFORMIN (GLUCOPHAGE) 1000 MG tablet Take 1,000 mg by mouth 2 (two) times daily with a meal.   Yes [provider]  metoprolol succinate (TOPROL-XL) 25 MG 24 hr tablet Take 3 tablets (75 mg total) by mouth 2 (two) times daily. Take with or immediately following a meal. 08/13/19  Yes [name], PA-C  prazosin (MINIPRESS) 2 MG capsule Take 4 mg by mouth at bedtime.   Yes [provider]  pregabalin (LYRICA) 100 MG capsule Take 100 mg by mouth in the morning, at noon, and at bedtime.  07/14/19  Yes [provider]  propranolol (INDERAL) 10 MG tablet Take 1 tablet (10 mg total) by mouth as needed. Patient taking differently: Take 10 mg by mouth as needed (as directed for rapid heart rate).  09/24/19  Yes Turner, Eber Hong, MD  silver sulfADIAZINE (SILVADENE) 1 % cream Apply 1 application topically See admin instructions. Apply as directed once a day as directed to bilateral legs   Yes [provider]  temazepam (RESTORIL) 15 MG capsule Take 15 mg by mouth at bedtime.    Yes [provider]  traZODone (DESYREL) 100 MG tablet Take 200 mg by mouth at bedtime.    Yes [provider]  Trolamine Salicylate (ASPERCREME EX) Apply 1 application topically 2 (two) times daily as needed (pain and inflammation).   Yes [provider]  venlafaxine XR (EFFEXOR-XR) 150 MG 24 hr capsule Take 150 mg by mouth daily with breakfast.   Yes [provider]  Vitamin D, Ergocalciferol, (DRISDOL) 1.25 MG (50000 UNIT) CAPS capsule Take 50,000 Units by mouth every 7 (seven) days.   Yes [provider]  blood glucose meter kit and supplies Dispense based on patient and insurance preference. Use up to four times daily as directed. (FOR ICD-10 E10.9, E11.9). 11/09/17   Eugenie Filler, MD  cephALEXin (KEFLEX) 500 MG capsule Take 1 capsule (500 mg total) by mouth 3 (three) times daily. 11/20/19   Dorie Rank, MD    Allergies    Vancomycin, Tubersol [tuberculin], and Doxycycline  Review of Systems   Review of Systems  All other systems reviewed and are negative.   Physical Exam Updated Vital Signs BP 127/65   Pulse 73   Temp 98.2 F (36.8 C) (Oral)   Resp 17   Ht 1.88 m ('6\' 2"'$ )   Wt (!) 194.1 kg   SpO2 92%   BMI 54.95 kg/m   Physical Exam Vitals and nursing note reviewed.  Constitutional:      General: He is not in acute distress.    Appearance: He is obese.  HENT:     Head: Normocephalic and atraumatic.     Right Ear: External ear normal.     Left Ear: External ear normal.  Eyes:     General: No scleral icterus.       Right eye: No discharge.        Left eye: No discharge.     Conjunctiva/sclera: Conjunctivae normal.  Neck:     Trachea: No tracheal deviation.  Cardiovascular:     Rate and Rhythm: Normal rate and regular rhythm.  Pulmonary:  Effort: Pulmonary effort is normal. No respiratory distress.     Breath sounds: Normal breath sounds. No stridor. No wheezing or rales.  Abdominal:     General: Bowel sounds are normal. There is no distension.     Palpations: Abdomen is soft.     Tenderness: There is no  abdominal tenderness. There is no guarding or rebound.  Musculoskeletal:        General: No tenderness.     Cervical back: Neck supple.     Right lower leg: Edema present.     Left lower leg: Edema present.     Comments: Mild erythema bilateral anterior lower extremities below the knees, no lymphangitic streaking, 3+ pitting edema bilateral lower extremities  Skin:    General: Skin is warm and dry.     Findings: No rash.  Neurological:     Mental Status: He is alert.     Cranial Nerves: No cranial nerve deficit (no facial droop, extraocular movements intact, no slurred speech).     Sensory: No sensory deficit.     Motor: No abnormal muscle tone or seizure activity.     Coordination: Coordination normal.     ED Results / Procedures / Treatments   Labs (all labs ordered are listed, but only abnormal results are displayed) Labs Reviewed  COMPREHENSIVE METABOLIC PANEL - Abnormal; Notable for the following components:      Result Value   Chloride 96 (*)    Glucose, Bld 157 (*)    BUN 24 (*)    Calcium 8.8 (*)    Total Bilirubin 0.2 (*)    All other components within normal limits  CBC WITH DIFFERENTIAL/PLATELET - Abnormal; Notable for the following components:   WBC 12.8 (*)    Neutro Abs 9.6 (*)    Abs Immature Granulocytes 0.11 (*)    All other components within normal limits  URINALYSIS, ROUTINE W REFLEX MICROSCOPIC - Abnormal; Notable for the following components:   Leukocytes,Ua SMALL (*)    Bacteria, UA MANY (*)    All other components within normal limits  CULTURE, BLOOD (ROUTINE X 2)  LACTIC ACID, PLASMA  PROTIME-INR    EKG None  Radiology DG Chest 2 View  Result Date: 11/20/2019 CLINICAL DATA:  Shortness of breath. Additional history provided: Leg swelling for approximately 2 weeks, shortness of breath and chest pain for approximately 1 week, history of CHF, ex smoker, diabetes, hypertension, asthma, COPD. EXAM: CHEST - 2 VIEW COMPARISON:  CTA chest 09/08/2018,  chest radiograph 09/09/2019 FINDINGS: Unchanged cardiomegaly. Mild bibasilar atelectasis. No evidence of airspace consolidation. No evidence of pleural effusion or pneumothorax. No acute bony abnormality. Thoracic spondylosis. IMPRESSION: Unchanged cardiomegaly. Minimal bibasilar atelectasis. No evidence of airspace consolidation. Electronically Signed   By: Kellie Simmering DO   On: 11/20/2019 14:40    Procedures Procedures (including critical care time)  Medications Ordered in ED Medications  acetaminophen (TYLENOL) tablet 650 mg (has no administration in time range)  furosemide (LASIX) injection 80 mg (80 mg Intravenous Given 11/20/19 1757)  potassium chloride SA (KLOR-CON) CR tablet 40 mEq (40 mEq Oral Given 11/20/19 1757)  cephALEXin (KEFLEX) capsule 500 mg (500 mg Oral Given 11/20/19 1801)    ED Course  I have reviewed the triage vital signs and the nursing notes.  Pertinent labs & imaging results that were available during my care of the patient were reviewed by me and considered in my medical decision making (see chart for details).  Clinical Course as of Apr  [place] Nov 20, 2019  1742 Oxygen saturation is in the mid 90s without supplemental oxygen.  Labs reviewed.  Chest x-ray does not show any evidence of pulmonary edema.  No significant abnormalities noted on electrolyte panel.  CBC shows leukocytosis but no elevated lactic acid level.  Doubt severe infection   [JK]  1953 Patient put out over 700 cc after diuretics   [JK]    Clinical Course User Index [JK] Dorie Rank, MD   MDM Rules/Calculators/A&P                      Patient presented with leg swelling and redness of his lower extremities.  Patient has chronic peripheral edema and morbid obesity.  Patient has mild erythema suggest the possibility of an early cellulitis associated with his peripheral lymphedema.  No findings of pulmonary edema.  No signs of sepsis or severe infection patient's oxygenation is stable.  Patient  states at baseline he does not walk and uses a scooter.  Patient did have good diuresis with IV diuretics here in the emergency room.  I will have him continue his home regimen.  Plan on discharge home with course of antibiotics.  Close follow-up with his primary care doctor. Final Clinical Impression(s) / ED Diagnoses Final diagnoses:  Peripheral edema  Cellulitis of lower extremity, unspecified laterality    Rx / DC Orders ED Discharge Orders         Ordered    cephALEXin (KEFLEX) 500 MG capsule  3 times daily     11/20/19 1956           Dorie Rank, MD 11/20/19 1958

## 2019-11-20 NOTE — ED Triage Notes (Signed)
Arrived via EMS from home patient patient having bilateral lower extremity swelling with redness with clear drainage and increased shortness of breath on exertion for one week. Denies chest pain.  EMS reported pulses oximtery 86% room air placed on 4L [place] increased to 95%.

## 2019-11-20 NOTE — Discharge Instructions (Signed)
Take the antibiotics as prescribed.  Continue your diuretic medications.  Follow-up with your primary care doctor and cardiologist to make sure you are improving

## 2019-11-25 LAB — CULTURE, BLOOD (ROUTINE X 2)
Culture: NO GROWTH
Special Requests: ADEQUATE

## 2019-12-04 ENCOUNTER — Inpatient Hospital Stay (HOSPITAL_COMMUNITY)
Admission: EM | Admit: 2019-12-04 | Discharge: 2019-12-07 | DRG: 602 | Disposition: A | Discharge status: Home Health | Payer: Medicare Other | Attending: Internal Medicine | Admitting: Internal Medicine

## 2019-12-04 ENCOUNTER — Other Ambulatory Visit: Payer: Self-pay

## 2019-12-04 ENCOUNTER — Encounter (HOSPITAL_COMMUNITY): Payer: Self-pay | Admitting: *Deleted

## 2019-12-04 DIAGNOSIS — Z8249 Family history of ischemic heart disease and other diseases of the circulatory system: Secondary | ICD-10-CM

## 2019-12-04 DIAGNOSIS — Z20822 Contact with and (suspected) exposure to covid-19: Secondary | ICD-10-CM | POA: Diagnosis present

## 2019-12-04 DIAGNOSIS — E669 Obesity, unspecified: Secondary | ICD-10-CM | POA: Diagnosis not present

## 2019-12-04 DIAGNOSIS — I5032 Chronic diastolic (congestive) heart failure: Secondary | ICD-10-CM | POA: Diagnosis present

## 2019-12-04 DIAGNOSIS — E1140 Type 2 diabetes mellitus with diabetic neuropathy, unspecified: Secondary | ICD-10-CM | POA: Diagnosis not present

## 2019-12-04 DIAGNOSIS — F329 Major depressive disorder, single episode, unspecified: Secondary | ICD-10-CM | POA: Diagnosis present

## 2019-12-04 DIAGNOSIS — Z66 Do not resuscitate: Secondary | ICD-10-CM | POA: Diagnosis present

## 2019-12-04 DIAGNOSIS — Z833 Family history of diabetes mellitus: Secondary | ICD-10-CM | POA: Diagnosis not present

## 2019-12-04 DIAGNOSIS — J449 Chronic obstructive pulmonary disease, unspecified: Secondary | ICD-10-CM | POA: Diagnosis present

## 2019-12-04 DIAGNOSIS — E1169 Type 2 diabetes mellitus with other specified complication: Secondary | ICD-10-CM | POA: Diagnosis not present

## 2019-12-04 DIAGNOSIS — Z7982 Long term (current) use of aspirin: Secondary | ICD-10-CM

## 2019-12-04 DIAGNOSIS — Z8719 Personal history of other diseases of the digestive system: Secondary | ICD-10-CM

## 2019-12-04 DIAGNOSIS — Z7984 Long term (current) use of oral hypoglycemic drugs: Secondary | ICD-10-CM

## 2019-12-04 DIAGNOSIS — F4310 Post-traumatic stress disorder, unspecified: Secondary | ICD-10-CM | POA: Diagnosis present

## 2019-12-04 DIAGNOSIS — I10 Essential (primary) hypertension: Secondary | ICD-10-CM

## 2019-12-04 DIAGNOSIS — Z79899 Other long term (current) drug therapy: Secondary | ICD-10-CM

## 2019-12-04 DIAGNOSIS — I2510 Atherosclerotic heart disease of native coronary artery without angina pectoris: Secondary | ICD-10-CM | POA: Diagnosis present

## 2019-12-04 DIAGNOSIS — Z87891 Personal history of nicotine dependence: Secondary | ICD-10-CM

## 2019-12-04 DIAGNOSIS — E1165 Type 2 diabetes mellitus with hyperglycemia: Secondary | ICD-10-CM | POA: Diagnosis not present

## 2019-12-04 DIAGNOSIS — I872 Venous insufficiency (chronic) (peripheral): Secondary | ICD-10-CM | POA: Diagnosis present

## 2019-12-04 DIAGNOSIS — Z6841 Body Mass Index (BMI) 40.0 and over, adult: Secondary | ICD-10-CM | POA: Diagnosis not present

## 2019-12-04 DIAGNOSIS — I110 Hypertensive heart disease with heart failure: Secondary | ICD-10-CM | POA: Diagnosis present

## 2019-12-04 DIAGNOSIS — E1142 Type 2 diabetes mellitus with diabetic polyneuropathy: Secondary | ICD-10-CM | POA: Diagnosis not present

## 2019-12-04 DIAGNOSIS — Z96652 Presence of left artificial knee joint: Secondary | ICD-10-CM | POA: Diagnosis present

## 2019-12-04 DIAGNOSIS — F419 Anxiety disorder, unspecified: Secondary | ICD-10-CM | POA: Diagnosis present

## 2019-12-04 DIAGNOSIS — L0390 Cellulitis, unspecified: Secondary | ICD-10-CM | POA: Diagnosis not present

## 2019-12-04 DIAGNOSIS — G4733 Obstructive sleep apnea (adult) (pediatric): Secondary | ICD-10-CM | POA: Diagnosis present

## 2019-12-04 DIAGNOSIS — E872 Acidosis: Secondary | ICD-10-CM | POA: Diagnosis present

## 2019-12-04 DIAGNOSIS — Z888 Allergy status to other drugs, medicaments and biological substances status: Secondary | ICD-10-CM

## 2019-12-04 DIAGNOSIS — I5033 Acute on chronic diastolic (congestive) heart failure: Secondary | ICD-10-CM | POA: Diagnosis present

## 2019-12-04 DIAGNOSIS — I878 Other specified disorders of veins: Secondary | ICD-10-CM | POA: Diagnosis present

## 2019-12-04 DIAGNOSIS — N400 Enlarged prostate without lower urinary tract symptoms: Secondary | ICD-10-CM | POA: Diagnosis present

## 2019-12-04 DIAGNOSIS — Z881 Allergy status to other antibiotic agents status: Secondary | ICD-10-CM

## 2019-12-04 DIAGNOSIS — L03115 Cellulitis of right lower limb: Principal | ICD-10-CM | POA: Diagnosis present

## 2019-12-04 DIAGNOSIS — L538 Other specified erythematous conditions: Secondary | ICD-10-CM | POA: Diagnosis not present

## 2019-12-04 DIAGNOSIS — I771 Stricture of artery: Secondary | ICD-10-CM | POA: Diagnosis present

## 2019-12-04 DIAGNOSIS — M7989 Other specified soft tissue disorders: Secondary | ICD-10-CM | POA: Diagnosis present

## 2019-12-04 LAB — CBC WITH DIFFERENTIAL/PLATELET
Abs Immature Granulocytes: 0.17 10*3/uL — ABNORMAL HIGH (ref 0.00–0.07)
Basophils Absolute: 0.1 10*3/uL (ref 0.0–0.1)
Basophils Relative: 1 %
Eosinophils Absolute: 0.4 10*3/uL (ref 0.0–0.5)
Eosinophils Relative: 3 %
HCT: 41.6 % (ref 39.0–52.0)
Hemoglobin: 12.9 g/dL — ABNORMAL LOW (ref 13.0–17.0)
Immature Granulocytes: 1 %
Lymphocytes Relative: 11 %
Lymphs Abs: 1.4 10*3/uL (ref 0.7–4.0)
MCH: 28.8 pg (ref 26.0–34.0)
MCHC: 31 g/dL (ref 30.0–36.0)
MCV: 92.9 fL (ref 80.0–100.0)
Monocytes Absolute: 0.9 10*3/uL (ref 0.1–1.0)
Monocytes Relative: 7 %
Neutro Abs: 10 10*3/uL — ABNORMAL HIGH (ref 1.7–7.7)
Neutrophils Relative %: 77 %
Platelets: 264 10*3/uL (ref 150–400)
RBC: 4.48 MIL/uL (ref 4.22–5.81)
RDW: 14.6 % (ref 11.5–15.5)
WBC: 12.9 10*3/uL — ABNORMAL HIGH (ref 4.0–10.5)
nRBC: 0 % (ref 0.0–0.2)

## 2019-12-04 LAB — CBG MONITORING, ED
Glucose-Capillary: 189 mg/dL — ABNORMAL HIGH (ref 70–99)
Glucose-Capillary: 263 mg/dL — ABNORMAL HIGH (ref 70–99)

## 2019-12-04 LAB — HIV ANTIBODY (ROUTINE TESTING W REFLEX): HIV Screen 4th Generation wRfx: NONREACTIVE

## 2019-12-04 LAB — COMPREHENSIVE METABOLIC PANEL
ALT: 21 U/L (ref 0–44)
AST: 15 U/L (ref 15–41)
Albumin: 3.5 g/dL (ref 3.5–5.0)
Alkaline Phosphatase: 70 U/L (ref 38–126)
Anion gap: 11 (ref 5–15)
BUN: 23 mg/dL (ref 8–23)
CO2: 29 mmol/L (ref 22–32)
Calcium: 9.2 mg/dL (ref 8.9–10.3)
Chloride: 94 mmol/L — ABNORMAL LOW (ref 98–111)
Creatinine, Ser: 0.95 mg/dL (ref 0.61–1.24)
GFR calc Af Amer: >60 mL/min (ref >60)
GFR calc non Af Amer: >60 mL/min (ref >60)
Glucose, Bld: 209 mg/dL — ABNORMAL HIGH (ref 70–99)
Potassium: 4.5 mmol/L (ref 3.5–5.1)
Sodium: 134 mmol/L — ABNORMAL LOW (ref 135–145)
Total Bilirubin: 0.6 mg/dL (ref 0.3–1.2)
Total Protein: 6.4 g/dL — ABNORMAL LOW (ref 6.5–8.1)

## 2019-12-04 LAB — URINALYSIS, ROUTINE W REFLEX MICROSCOPIC
Bilirubin Urine: NEGATIVE
Glucose, UA: NEGATIVE mg/dL
Hgb urine dipstick: NEGATIVE
Ketones, ur: NEGATIVE mg/dL
Leukocytes,Ua: NEGATIVE
Nitrite: NEGATIVE
Protein, ur: NEGATIVE mg/dL
Specific Gravity, Urine: 1.014 (ref 1.005–1.030)
pH: 6 (ref 5.0–8.0)

## 2019-12-04 LAB — RESPIRATORY PANEL BY RT PCR (FLU A&B, COVID)
Influenza A by PCR: NEGATIVE
Influenza B by PCR: NEGATIVE
SARS Coronavirus 2 by RT PCR: NEGATIVE

## 2019-12-04 LAB — LACTIC ACID, PLASMA
Lactic Acid, Venous: 2 mmol/L (ref 0.5–1.9)
Lactic Acid, Venous: 1.9 mmol/L (ref 0.5–1.9)

## 2019-12-04 LAB — BRAIN NATRIURETIC PEPTIDE: B Natriuretic Peptide: 52.3 pg/mL (ref 0.0–100.0)

## 2019-12-04 LAB — D-DIMER, QUANTITATIVE: D-Dimer, Quant: 0.96 ug/mL-FEU — ABNORMAL HIGH (ref 0.00–0.50)

## 2019-12-04 MED ORDER — ONDANSETRON HCL 4 MG PO TABS: 4.0000 mg | ORAL_TABLET | Freq: Four times a day (QID) | ORAL | Status: DC | PRN

## 2019-12-04 MED ORDER — CLINDAMYCIN PHOSPHATE 600 MG/50ML IV SOLN
600.0000 mg | Freq: Three times a day (TID) | INTRAVENOUS | Status: DC
  Administered 2019-12-04 – 2019-12-05 (×2): 600 mg via INTRAVENOUS
  Filled 2019-12-04 (×2): qty 50

## 2019-12-04 MED ORDER — INSULIN ASPART 100 UNIT/ML ~~LOC~~ SOLN
0.0000 [IU] | Freq: Three times a day (TID) | SUBCUTANEOUS | Status: DC
  Administered 2019-12-05 (×2): 5 [IU] via SUBCUTANEOUS
  Administered 2019-12-05: 8 [IU] via SUBCUTANEOUS
  Administered 2019-12-06: 5 [IU] via SUBCUTANEOUS
  Administered 2019-12-06: 3 [IU] via SUBCUTANEOUS
  Administered 2019-12-06: 8 [IU] via SUBCUTANEOUS
  Administered 2019-12-07: 3 [IU] via SUBCUTANEOUS
  Administered 2019-12-07: 5 [IU] via SUBCUTANEOUS

## 2019-12-04 MED ORDER — FERROUS SULFATE 325 (65 FE) MG PO TABS
324.0000 mg | ORAL_TABLET | Freq: Every day | ORAL | Status: DC
  Administered 2019-12-05 – 2019-12-07 (×3): 324 mg via ORAL
  Filled 2019-12-04 (×4): qty 1

## 2019-12-04 MED ORDER — ASPIRIN EC 81 MG PO TBEC
81.0000 mg | DELAYED_RELEASE_TABLET | Freq: Every day | ORAL | Status: DC
  Administered 2019-12-04 – 2019-12-07 (×4): 81 mg via ORAL
  Filled 2019-12-04 (×4): qty 1

## 2019-12-04 MED ORDER — FUROSEMIDE 10 MG/ML IJ SOLN
60.0000 mg | Freq: Two times a day (BID) | INTRAMUSCULAR | Status: DC
  Administered 2019-12-04 – 2019-12-07 (×6): 60 mg via INTRAVENOUS
  Filled 2019-12-04 (×6): qty 6

## 2019-12-04 MED ORDER — DOCUSATE SODIUM 100 MG PO CAPS
100.0000 mg | ORAL_CAPSULE | Freq: Two times a day (BID) | ORAL | Status: DC
  Administered 2019-12-04 – 2019-12-07 (×6): 100 mg via ORAL
  Filled 2019-12-04 (×6): qty 1

## 2019-12-04 MED ORDER — TEMAZEPAM 7.5 MG PO CAPS
15.0000 mg | ORAL_CAPSULE | Freq: Every day | ORAL | Status: DC
  Administered 2019-12-05 – 2019-12-06 (×2): 15 mg via ORAL
  Filled 2019-12-04 (×2): qty 2

## 2019-12-04 MED ORDER — METOPROLOL SUCCINATE ER 50 MG PO TB24
75.0000 mg | ORAL_TABLET | Freq: Two times a day (BID) | ORAL | Status: DC
  Administered 2019-12-04 – 2019-12-07 (×6): 75 mg via ORAL
  Filled 2019-12-04 (×5): qty 1
  Filled 2019-12-04: qty 3

## 2019-12-04 MED ORDER — ALLOPURINOL 100 MG PO TABS
200.0000 mg | ORAL_TABLET | Freq: Every day | ORAL | Status: DC
  Administered 2019-12-05 – 2019-12-07 (×3): 200 mg via ORAL
  Filled 2019-12-04 (×4): qty 2

## 2019-12-04 MED ORDER — CLINDAMYCIN PHOSPHATE 600 MG/50ML IV SOLN
600.0000 mg | Freq: Once | INTRAVENOUS | Status: AC
  Administered 2019-12-04: 600 mg via INTRAVENOUS
  Filled 2019-12-04: qty 50

## 2019-12-04 MED ORDER — ONDANSETRON HCL 4 MG/2ML IJ SOLN: 4.0000 mg | Freq: Four times a day (QID) | INTRAMUSCULAR | Status: DC | PRN

## 2019-12-04 MED ORDER — ENOXAPARIN SODIUM 40 MG/0.4ML ~~LOC~~ SOLN: 40.0000 mg | SUBCUTANEOUS | Status: DC

## 2019-12-04 MED ORDER — ATORVASTATIN CALCIUM 40 MG PO TABS
40.0000 mg | ORAL_TABLET | Freq: Every day | ORAL | Status: DC
  Administered 2019-12-05 – 2019-12-06 (×2): 40 mg via ORAL
  Filled 2019-12-04 (×2): qty 1

## 2019-12-04 MED ORDER — ALBUTEROL SULFATE (2.5 MG/3ML) 0.083% IN NEBU: 2.5000 mg | INHALATION_SOLUTION | RESPIRATORY_TRACT | Status: DC | PRN

## 2019-12-04 MED ORDER — PREGABALIN 100 MG PO CAPS
100.0000 mg | ORAL_CAPSULE | Freq: Every day | ORAL | Status: DC
  Administered 2019-12-04 – 2019-12-07 (×4): 100 mg via ORAL
  Filled 2019-12-04 (×4): qty 1

## 2019-12-04 MED ORDER — COLCHICINE 0.6 MG PO TABS: 0.6000 mg | ORAL_TABLET | ORAL | Status: DC | PRN

## 2019-12-04 MED ORDER — LORATADINE 10 MG PO TABS
10.0000 mg | ORAL_TABLET | Freq: Every day | ORAL | Status: DC
  Administered 2019-12-04 – 2019-12-07 (×4): 10 mg via ORAL
  Filled 2019-12-04 (×4): qty 1

## 2019-12-04 MED ORDER — VENLAFAXINE HCL ER 150 MG PO CP24
150.0000 mg | ORAL_CAPSULE | Freq: Every day | ORAL | Status: DC
  Administered 2019-12-05 – 2019-12-07 (×3): 150 mg via ORAL
  Filled 2019-12-04 (×4): qty 1

## 2019-12-04 MED ORDER — FLUTICASONE PROPIONATE 50 MCG/ACT NA SUSP
2.0000 | Freq: Every day | NASAL | Status: DC
  Administered 2019-12-05 – 2019-12-07 (×3): 2 via NASAL
  Filled 2019-12-04 (×2): qty 16

## 2019-12-04 MED ORDER — SODIUM CHLORIDE 0.9% FLUSH
3.0000 mL | Freq: Once | INTRAVENOUS | Status: AC
  Administered 2019-12-04: 3 mL via INTRAVENOUS

## 2019-12-04 MED ORDER — PRAZOSIN HCL 2 MG PO CAPS
4.0000 mg | ORAL_CAPSULE | Freq: Every day | ORAL | Status: DC
  Administered 2019-12-04 – 2019-12-06 (×3): 4 mg via ORAL
  Filled 2019-12-04 (×4): qty 2

## 2019-12-04 MED ORDER — TRAZODONE HCL 50 MG PO TABS
200.0000 mg | ORAL_TABLET | Freq: Every day | ORAL | Status: DC
  Administered 2019-12-04 – 2019-12-06 (×3): 200 mg via ORAL
  Filled 2019-12-04 (×3): qty 4

## 2019-12-04 MED ORDER — OXYCODONE-ACETAMINOPHEN 5-325 MG PO TABS
1.0000 | ORAL_TABLET | ORAL | Status: AC | PRN
  Administered 2019-12-04 (×2): 1 via ORAL
  Filled 2019-12-04 (×3): qty 1

## 2019-12-04 MED ORDER — ENOXAPARIN SODIUM 100 MG/ML ~~LOC~~ SOLN
90.0000 mg | SUBCUTANEOUS | Status: DC
  Administered 2019-12-04 – 2019-12-06 (×3): 90 mg via SUBCUTANEOUS
  Filled 2019-12-04 (×4): qty 0.9

## 2019-12-04 MED ORDER — INSULIN ASPART 100 UNIT/ML ~~LOC~~ SOLN
0.0000 [IU] | Freq: Every day | SUBCUTANEOUS | Status: DC
  Administered 2019-12-04: 3 [IU] via SUBCUTANEOUS
  Administered 2019-12-06: 2 [IU] via SUBCUTANEOUS

## 2019-12-04 MED ORDER — SENNOSIDES-DOCUSATE SODIUM 8.6-50 MG PO TABS: 1.0000 | ORAL_TABLET | Freq: Every evening | ORAL | Status: DC | PRN

## 2019-12-04 MED ORDER — ARIPIPRAZOLE 5 MG PO TABS
5.0000 mg | ORAL_TABLET | Freq: Every day | ORAL | Status: DC
  Administered 2019-12-04 – 2019-12-06 (×3): 5 mg via ORAL
  Filled 2019-12-04 (×3): qty 1

## 2019-12-04 MED ORDER — BISACODYL 10 MG RE SUPP: 10.0000 mg | Freq: Every day | RECTAL | Status: DC | PRN

## 2019-12-04 NOTE — H&P (Signed)
History and Physical    Lawrence [name] JME:[id number] DOB: Jan 31, 1956 DOA: 12/04/2019  PCP: [facility] Patient coming from: Home.  Chief Complaint: Right leg infection  HPI: Lawrence [name] is a 64 y.o. male with history of diastolic CHF, COPD, CAD, HTN, OSA, DM-2, PTSD/anxiety, BPH and chronic venous insufficiency with "right leg infection".  Patient was seen in ED on 4/14 where he was discharged on Keflex for RLE cellulitis.  He completed Keflex for 7 days with some improvement in his RLE cellulitis.  However, RLE swelling, redness and pain gradually gotten worse after he finished Keflex and prompted him to come to ED.  He denies fever or chills.  Pain became severe today.  He rates his pain 7/10 after pain medication in ED.  He also noted some drainage and weeping from right lower extremity.  He denies numbness or tingling.  He also reports gradual progressive dyspnea over the last few months.  He denies acute changes in days or weeks.  He has chronic baseline orthopnea.  He uses hospital bed.  His wife reports about 20 pounds weight gain over the last few months.  She thinks his dry weight is about 395 to 400 pounds.  He thinks he is about 420 pounds now.  He denies nausea, vomiting, abdominal pain, focal numbness, weakness or tingling.  He denies history of DVT or PE.  He denies recent long travel.  He reports completing 2 series of COVID-19 vaccines about 2 weeks ago.  He reports good compliance with his medications including Lasix.  He lives with his wife.  Denies smoking cigarettes, drinking alcohol or recreational drug use.  He says he is DNR/DNI.  He has his DNR/DNI paper with him.  In ED, hemodynamically stable.  93% on room air.  Afebrile. Na 134.  Glucose 209.  WBC 12.9.  Otherwise, CBC and CMP without significant finding.  Lactic acid 2.0.  Influenza and COVID-19 PCR negative.  Blood cultures obtained.  Started on IV clindamycin.  And hospital service was called for admission for  RLE cellulitis.  ROS All review of system negative except for pertinent positives and negatives as history of present illness above.  PMH Past Medical History:  Diagnosis Date  . Acute on chronic diastolic CHF (congestive heart failure) ([place]) 01/13/2018  . Acute respiratory failure with hypoxia ([place]) 01/13/2018  . Adenomatous colon polyp 2007  . Anasarca 01/31/2017  . Anemia   . Anxiety   . Arthritis   . Asthma   . Bilateral lower extremity edema 01/31/2017  . Bilateral lower leg cellulitis   . BPH without obstruction/lower urinary tract symptoms 02/22/2017  . Cellulitis 11/07/2017  . Cellulitis of lower extremity   . Chest pain   . Chronic cough   . Chronic diastolic CHF (congestive heart failure) ([place])   . Chronic venous stasis 03/07/2019  . COPD (chronic obstructive pulmonary disease) ([place])   . Coronary artery calcification seen on CAT scan   . Depression   . Diabetic neuropathy ([place]) 09/11/2019  . Hematuria 02/22/2017  . History of anemia 12/26/2018  . History of cardiac cath    a.  01/2018 with angiographically minimal coronary disease with  up to 40% prox LAD and 20% mid LCx and OM2 with normal LVF and normal LVEDP..  . History of colon polyps 08/24/2018  . Hypertension   . Leukocytosis 08/18/2018  . Morbid obesity ([place])   . New onset type 2 diabetes mellitus ([place]) 11/08/2017  . OSA (obstructive sleep apnea)   .  Pain due to onychomycosis of toenails of both feet 09/11/2019  . Peripheral neuropathy 02/22/2017  . Physical deconditioning 02/22/2017  . Primary osteoarthritis, left shoulder 03/05/2017  . PTSD (post-traumatic stress disorder)   . Pure hypercholesterolemia   . QT prolongation 03/07/2019  . Right foot pain 01/13/2018  . Shortness of breath   . Sinus tachycardia 03/07/2019  . Sleep apnea    CPAP  . Type 2 diabetes mellitus with vascular disease ([place]) 09/11/2019   [place] Past Surgical History:  Procedure Laterality Date  . JOINT REPLACEMENT     left knee replacement x 2  . KNEE  ARTHROSCOPY Bilateral   . LEFT HEART CATH AND CORONARY ANGIOGRAPHY N/A 01/17/2018   Procedure: LEFT HEART CATH AND CORONARY ANGIOGRAPHY;  Surgeon: Leonie Man, MD;  Location: [facility];  Service: Cardiovascular;  Laterality: N/A;  . UMBILICAL HERNIA REPAIR     Fam HX Family History  Problem Relation Age of Onset  . CAD Maternal Grandfather   . Diabetes Other   . Diabetes Mellitus II Neg Hx   . Colon cancer Neg Hx   . Esophageal cancer Neg Hx   . Inflammatory bowel disease Neg Hx   . Liver disease Neg Hx   . Pancreatic cancer Neg Hx   . Rectal cancer Neg Hx   . Stomach cancer Neg Hx    Social Hx  reports that he quit smoking about 3 years ago. He has never used smokeless tobacco. He reports previous alcohol use. He reports that he does not use drugs.  Allergy Allergies  Allergen Reactions  . Vancomycin Other (See Comments)    "Red Man Syndrome"  . Tubersol [Tuberculin] Other (See Comments)    Reaction unknown  . Doxycycline Rash   Home Meds Prior to Admission medications   Medication Sig Start Date End Date Taking? Authorizing Provider  acetaminophen (TYLENOL) 650 MG CR tablet Take 1,300 mg by mouth every 8 (eight) hours as needed for pain.   Yes [provider]  allopurinol (ZYLOPRIM) 100 MG tablet Take 1 tablet (100 mg total) by mouth daily. Patient taking differently: Take 200 mg by mouth daily.  01/18/18  Yes Domenic Polite, MD  ARIPiprazole (ABILIFY) 5 MG tablet Take 5 mg by mouth at bedtime.    Yes [provider]  aspirin EC 81 MG EC tablet Take 1 tablet (81 mg total) by mouth daily. 01/19/18  Yes Domenic Polite, MD  atorvastatin (LIPITOR) 40 MG tablet Take 1 tablet (40 mg total) by mouth daily at 6 PM. 09/24/19  Yes Turner, Eber Hong, MD  blood glucose meter kit and supplies Dispense based on patient and insurance preference. Use up to four times daily as directed. (FOR ICD-10 E10.9, E11.9). 11/09/17  Yes Eugenie Filler, MD  cetirizine  (ZYRTEC) 10 MG tablet Take 10 mg by mouth at bedtime.    Yes [provider]  colchicine 0.6 MG tablet Take 0.6 mg by mouth as needed (gout).  08/27/19  Yes [provider]  ferrous sulfate 324 (65 Fe) MG TBEC Take 324 mg by mouth daily with breakfast.    Yes [provider]  fluticasone (FLONASE) 50 MCG/ACT nasal spray Place 2 sprays into both nostrils daily.    Yes [provider]  furosemide (LASIX) 80 MG tablet Take 1 tablet (80 mg total) by mouth 2 (two) times daily. Patient taking differently: Take 80-160 mg by mouth 2 (two) times daily. 137m in the morning and 815mat  night. 09/24/19  Yes Turner, Eber Hong, MD  glipiZIDE (GLUCOTROL) 5 MG tablet Take 1 tablet (5 mg total) by mouth 2 (two) times daily. 03/09/19 03/08/20 Yes Shelly Coss, MD  lisinopril (ZESTRIL) 10 MG tablet Take 1 tablet (10 mg total) by mouth daily. 09/24/19  Yes Turner, Eber Hong, MD  loperamide (IMODIUM) 2 MG capsule Take 2 mg by mouth 2 (two) times a day.    Yes [provider]  meloxicam (MOBIC) 7.5 MG tablet Take 7.5 mg by mouth daily as needed for pain.   Yes [provider]  metFORMIN (GLUCOPHAGE) 1000 MG tablet Take 1,000 mg by mouth 2 (two) times daily with a meal.   Yes [provider]  metoprolol succinate (TOPROL-XL) 25 MG 24 hr tablet Take 3 tablets (75 mg total) by mouth 2 (two) times daily. Take with or immediately following a meal. 08/13/19  Yes [name], PA-C  prazosin (MINIPRESS) 2 MG capsule Take 4 mg by mouth at bedtime.   Yes [provider]  pregabalin (LYRICA) 100 MG capsule Take 100 mg by mouth in the morning, at noon, and at bedtime.  07/14/19  Yes [provider]  propranolol (INDERAL) 10 MG tablet Take 1 tablet (10 mg total) by mouth as needed. Patient taking differently: Take 10 mg by mouth as needed (as directed for rapid heart rate).  09/24/19  Yes Turner, Eber Hong, MD  silver sulfADIAZINE (SILVADENE) 1 % cream Apply 1  application topically See admin instructions. Apply as directed once a day as directed to bilateral legs   Yes [provider]  temazepam (RESTORIL) 15 MG capsule Take 15 mg by mouth at bedtime.    Yes [provider]  traZODone (DESYREL) 100 MG tablet Take 200 mg by mouth at bedtime.   Yes [provider]  Trolamine Salicylate (ASPERCREME EX) Apply 1 application topically 2 (two) times daily as needed (pain and inflammation).   Yes [provider]  venlafaxine XR (EFFEXOR-XR) 150 MG 24 hr capsule Take 150 mg by mouth daily with breakfast.   Yes [provider]  Vitamin D, Ergocalciferol, (DRISDOL) 1.25 MG (50000 UNIT) CAPS capsule Take 50,000 Units by mouth every 7 (seven) days.   Yes [provider]  cephALEXin (KEFLEX) 500 MG capsule Take 1 capsule (500 mg total) by mouth 3 (three) times daily. Patient not taking: Reported on 12/04/2019 11/20/19   Dorie Rank, MD    Physical Exam: Vitals:   12/04/19 1700 12/04/19 1715 12/04/19 1730 12/04/19 1800  BP: (!) 149/64 127/73 130/69 (!) 149/80  Pulse: 73 84 78 81  Resp: _0 Temp:      TempSrc:      SpO2: 96% 95% 96% 95%  Weight:      Height:        GENERAL: No acute distress.  Appears well.  HEENT: MMM.  Vision and hearing grossly intact.  NECK: Supple.  No apparent JVD.  RESP: On room air.  No IWOB.  Fair aeration bilaterally. CVS:  RRR.  2/6 SEM over apex. ABD/GI/GU: Bowel sounds present. Soft. Non tender.  MSK/EXT:  Moves extremities.  BLE edema, right appears to be greater than left.  SKIN: Erythema over BLE with some ulceration, right greater than left.  Increased warmth to touch.  Venous stasis. NEURO: Awake, alert and oriented appropriately.  No gross deficit.  PSYCH: Calm. Normal affect.   Personally Reviewed Radiological Exams No results found.   Personally Reviewed Labs:  CBC: Recent Labs  Lab 12/04/19 0850  WBC 12.9*  NEUTROABS 10.0*  HGB 12.9*  HCT 41.6    MCV 92.9  PLT 967   Basic Metabolic Panel: Recent Labs  Lab 12/04/19 0850  NA 134*  K 4.5  CL 94*  CO2 29  GLUCOSE 209*  BUN 23  CREATININE 0.95  CALCIUM 9.2   GFR: Estimated Creatinine Clearance: 144.6 mL/min (by C-G formula based on SCr of 0.95 mg/dL). Liver Function Tests: Recent Labs  Lab 12/04/19 0850  AST 15  ALT 21  ALKPHOS 70  BILITOT 0.6  PROT 6.4*  ALBUMIN 3.5   No results for input(s): LIPASE, AMYLASE in the last 168 hours. No results for input(s): AMMONIA in the last 168 hours. Coagulation Profile: No results for input(s): INR, PROTIME in the last 168 hours. Cardiac Enzymes: No results for input(s): CKTOTAL, CKMB, CKMBINDEX, TROPONINI in the last 168 hours. BNP (last 3 results) No results for input(s): PROBNP in the last 8760 hours. HbA1C: No results for input(s): HGBA1C in the last 72 hours. CBG: Recent Labs  Lab 12/04/19 1146  GLUCAP 189*   Lipid Profile: No results for input(s): CHOL, HDL, LDLCALC, TRIG, CHOLHDL, LDLDIRECT in the last 72 hours. Thyroid Function Tests: No results for input(s): TSH, T4TOTAL, FREET4, T3FREE, THYROIDAB in the last 72 hours. Anemia Panel: No results for input(s): VITAMINB12, FOLATE, FERRITIN, TIBC, IRON, RETICCTPCT in the last 72 hours. Urine analysis:    Component Value Date/Time   COLORURINE YELLOW 11/20/2019 1800   APPEARANCEUR CLEAR 11/20/2019 1800   LABSPEC 1.010 11/20/2019 1800   PHURINE 5.0 11/20/2019 1800   GLUCOSEU NEGATIVE 11/20/2019 1800   HGBUR NEGATIVE 11/20/2019 1800   BILIRUBINUR NEGATIVE 11/20/2019 1800   KETONESUR NEGATIVE 11/20/2019 1800   PROTEINUR NEGATIVE 11/20/2019 1800   NITRITE NEGATIVE 11/20/2019 1800   LEUKOCYTESUR SMALL (A) 11/20/2019 1800    Sepsis Labs:  Lactic acid 2.0.  Personally Reviewed EKG:  EKG was not obtained   Assessment/Plan Right lower extremity nonpurulent cellulitis-technically failed outpatient treatment with Keflex for 7 days although there was slight  improvement.  Has mild lactic acidosis with leukocytosis but no fever or chills.  He also have chronic venous insufficiency which puts him at risk for recurrent cellulitis. -Continue IV clindamycin-has history of allergy to vancomycin. -Also diuresed with IV Lasix. -Encourage leg elevation. -Check D-dimer-if elevated, will get venous Doppler to exclude DVT. -Consult wound care. -May benefit from Unna boots after diuresis and improvement in cellulitis.  Chronic diastolic CHF: Has progressive dyspnea and weight gain over the last few months but denies acute change.  Reports good compliance with his diuretics, diet and fluid.  Has significant edema in both extremities echo in 02/2019 with EF of 55 to 89% and diastolic dysfunction.  He has significant BLE edema partly due to chronic venous insufficiency.  He is on p.o. Lasix 80 mg twice daily. -Start IV Lasix 60 mg twice daily -Monitor fluid status, renal function and electrolytes. -Sodium and fluid restrictions.  Chronic COPD?:  No medications on his list.  No PFT in his chart. -As needed nebulizers  History of CAD: LHC in 2019 with nonobstructive CAD.  No anginal symptoms. -Continue home medications.  Essential HTN: Normotensive. -Continue home meds -Diuretics as above.  OSA -Nightly CPAP  Uncontrolled DM-2 with hyperglycemia: A1c 9.4 in 02/2019.  On glipizide and Metformin at home. Recent Labs    12/04/19 1146 12/04/19 2108  GLUCAP 189* 263*  -SSI-moderate -Check hemoglobin A1c -Continue home  statin.  PTSD/anxiety/insomnia: Stable. -Continue home Abilify, Effexor, temazepam and trazodone.  BPH without LUTS -Continue home prazosin.  Chronic venous insufficiency -Diuretics and management as above.  Morbid obesity: Body mass index is 54.82 kg/m. -Encourage lifestyle change to lose weight. -May benefit from GLP-1 inhibitors.  DVT prophylaxis: Subcu Lovenox  Code Status: DNR/DNI Family Communication: Updated patient's wife  at bedside.  Disposition Plan: Admit to telemetry. Consults called: None Admission status: Inpatient.    Severity of Illness: The appropriate patient status for this patient is INPATIENT. Inpatient status is judged to be reasonable and necessary in order to provide the required intensity of service to ensure the patient's safety. The patient's presenting symptoms, physical exam findings, and initial radiographic and laboratory data in the context of their chronic comorbidities is felt to place them at high risk for further clinical deterioration. Furthermore, it is not anticipated that the patient will be medically stable for discharge from the hospital within 2 midnights of admission. The following factors support the patient status of inpatient.    "           The patient's presenting symptoms include worsening right lower extremity swelling, erythema and pain despite outpatient therapy with p.o. Keflex for 7 days. "           The worrisome physical exam findings include significant RLE swelling, erythema and increased warmth to touch "           The initial radiographic and laboratory data are worrisome because elevated lactic acid and white blood cell "           The chronic co-morbidities include CHF, DM-2, COPD, OSA, morbid obesity and chronic venous insufficiency.     I certify that at the point of admission it is my clinical judgment that the patient will require inpatient hospital care spanning beyond 2 midnights from the point of admission due to high intensity of service, high risk for further deterioration and high frequency of surveillance required.   Mercy Riding MD [facility]  If 7PM-7AM, please contact night-coverage [email or web address] Password [facility]  12/04/2019, 6:10 PM

## 2019-12-04 NOTE — ED Provider Notes (Signed)
Emergency Department Provider Note   I have reviewed the triage vital signs and the nursing notes.   HISTORY  Chief Complaint Leg Swelling   HPI Lawrence [name] is a 64 y.o. male with PMH reviewed below presents to the ED right leg swelling and drainage. Symptoms returned several days ago. He has a similar issues in the last two weeks. He completed a course of Keflex and symptoms improved. After ending the med his redness worsened and has now developed drainage. Left leg with baseline redness and no drainage. No fever or chills. No CP or SOB. Mild pain to the leg. No radiation of symptoms or modifying factors.   Past Medical History:  Diagnosis Date  . Acute on chronic diastolic CHF (congestive heart failure) ([place]) 01/13/2018  . Acute respiratory failure with hypoxia ([place]) 01/13/2018  . Adenomatous colon polyp 2007  . Anasarca 01/31/2017  . Anemia   . Anxiety   . Arthritis   . Asthma   . Bilateral lower extremity edema 01/31/2017  . Bilateral lower leg cellulitis   . BPH without obstruction/lower urinary tract symptoms 02/22/2017  . Cellulitis 11/07/2017  . Cellulitis of lower extremity   . Chest pain   . Chronic cough   . Chronic diastolic CHF (congestive heart failure) ([place])   . Chronic venous stasis 03/07/2019  . COPD (chronic obstructive pulmonary disease) ([place])   . Coronary artery calcification seen on CAT scan   . Depression   . Diabetic neuropathy ([place]) 09/11/2019  . Hematuria 02/22/2017  . History of anemia 12/26/2018  . History of cardiac cath    a.  01/2018 with angiographically minimal coronary disease with  up to 40% prox LAD and 20% mid LCx and OM2 with normal LVF and normal LVEDP..  . History of colon polyps 08/24/2018  . Hypertension   . Leukocytosis 08/18/2018  . Morbid obesity ([place])   . New onset type 2 diabetes mellitus ([place]) 11/08/2017  . OSA (obstructive sleep apnea)   . Pain due to onychomycosis of toenails of both feet 09/11/2019  . Peripheral neuropathy 02/22/2017   . Physical deconditioning 02/22/2017  . Primary osteoarthritis, left shoulder 03/05/2017  . PTSD (post-traumatic stress disorder)   . Pure hypercholesterolemia   . QT prolongation 03/07/2019  . Right foot pain 01/13/2018  . Shortness of breath   . Sinus tachycardia 03/07/2019  . Sleep apnea    CPAP  . Type 2 diabetes mellitus with vascular disease ([place]) 09/11/2019    Patient Active Problem List   Diagnosis Date Noted  . Cellulitis of right leg 12/04/2019  . Pain due to onychomycosis of toenails of both feet 09/11/2019  . Diabetic neuropathy ([place]) 09/11/2019  . Type 2 diabetes mellitus with vascular disease ([place]) 09/11/2019  . Sinus tachycardia 03/07/2019  . Chronic venous stasis 03/07/2019  . QT prolongation 03/07/2019  . History of anemia 12/26/2018  . Anemia 08/24/2018  . History of colon polyps 08/24/2018  . Do not resuscitate status 08/24/2018  . Chronic diastolic CHF (congestive heart failure) ([place]) 08/18/2018  . Leukocytosis 08/18/2018  . Bilateral lower leg cellulitis   . Coronary artery calcification seen on CAT scan   . Pure hypercholesterolemia   . Chest pain   . Shortness of breath   . Acute respiratory failure with hypoxia ([place]) 01/13/2018  . Right foot pain 01/13/2018  . Acute on chronic diastolic CHF (congestive heart failure) ([place]) 01/13/2018  . Diabetes mellitus type 2 in obese ([place]) 01/13/2018  . New onset  type 2 diabetes mellitus ([place]) 11/08/2017  . Cellulitis 11/07/2017  . Primary osteoarthritis, left shoulder 03/05/2017  . Essential hypertension, benign 02/22/2017  . BPH without obstruction/lower urinary tract symptoms 02/22/2017  . Hematuria 02/22/2017  . Peripheral neuropathy 02/22/2017  . Physical deconditioning 02/22/2017  . Morbid obesity ([place])   . PTSD (post-traumatic stress disorder)   . Anasarca 01/31/2017  . Bilateral lower extremity edema 01/31/2017  . Cellulitis of lower extremity   . OSA (obstructive sleep apnea)     Past Surgical  History:  Procedure Laterality Date  . JOINT REPLACEMENT     left knee replacement x 2  . KNEE ARTHROSCOPY Bilateral   . LEFT HEART CATH AND CORONARY ANGIOGRAPHY N/A 01/17/2018   Procedure: LEFT HEART CATH AND CORONARY ANGIOGRAPHY;  Surgeon: Leonie Man, MD;  Location: [facility];  Service: Cardiovascular;  Laterality: N/A;  . UMBILICAL HERNIA REPAIR      Allergies Vancomycin, Tubersol [tuberculin], and Doxycycline  Family History  Problem Relation Age of Onset  . CAD Maternal Grandfather   . Diabetes Other   . Diabetes Mellitus II Neg Hx   . Colon cancer Neg Hx   . Esophageal cancer Neg Hx   . Inflammatory bowel disease Neg Hx   . Liver disease Neg Hx   . Pancreatic cancer Neg Hx   . Rectal cancer Neg Hx   . Stomach cancer Neg Hx     Social History Social History   Tobacco Use  . Smoking status: Former Smoker    Quit date: 03/07/2016    Years since quitting: 3.7  . Smokeless tobacco: Never Used  Substance Use Topics  . Alcohol use: Not Currently    Comment: rare  . Drug use: No    Review of Systems  Constitutional: No fever/chills Eyes: No visual changes. ENT: No sore throat. Cardiovascular: Denies chest pain. Respiratory: Denies shortness of breath. Gastrointestinal: No abdominal pain.  No nausea, no vomiting.  No diarrhea.  No constipation. Genitourinary: Negative for dysuria. Musculoskeletal: Negative for back pain. Skin: Positive right leg pain, redness, and swelling. Neurological: Negative for headaches, focal weakness or numbness.  10-point ROS otherwise negative.  ____________________________________________   PHYSICAL EXAM:  VITAL SIGNS: ED Triage Vitals  Enc Vitals Group     BP 12/04/19 0838 138/84     Pulse Rate 12/04/19 0838 83     Resp 12/04/19 0838 17     Temp 12/04/19 0838 98.3 F (36.8 C)     Temp Source 12/04/19 0838 Oral     SpO2 12/04/19 0838 93 %     Weight 12/04/19 0838 (!) 427 lb (193.7 kg)     Height 12/04/19  0838 6\' 2"  (1.88 m)   Constitutional: Alert and oriented. Well appearing and in no acute distress. Eyes: Conjunctivae are normal.  Head: Atraumatic. Nose: No congestion/rhinnorhea. Mouth/Throat: Mucous membranes are moist.  Neck: No stridor.   Cardiovascular: Normal rate, regular rhythm. Good peripheral circulation. Grossly normal heart sounds.   Respiratory: Normal respiratory effort.   Gastrointestinal: No distention.  Musculoskeletal: Venous stasis dermatitis bilaterally with right leg with increased redness and honey crusted drainage. No abscess or ulcerations.  Neurologic:  Normal speech and language. Skin:  Skin is warm, dry and intact. No rash noted.  ____________________________________________   LABS (all labs ordered are listed, but only abnormal results are displayed)  Labs Reviewed  LACTIC ACID, PLASMA - Abnormal; Notable for the following components:      Result Value  Lactic Acid, Venous 2.0 (*)    All other components within normal limits  COMPREHENSIVE METABOLIC PANEL - Abnormal; Notable for the following components:   Sodium 134 (*)    Chloride 94 (*)    Glucose, Bld 209 (*)    Total Protein 6.4 (*)    All other components within normal limits  CBC WITH DIFFERENTIAL/PLATELET - Abnormal; Notable for the following components:   WBC 12.9 (*)    Hemoglobin 12.9 (*)    Neutro Abs 10.0 (*)    Abs Immature Granulocytes 0.17 (*)    All other components within normal limits  D-DIMER, QUANTITATIVE (NOT AT [facility]) - Abnormal; Notable for the following components:   D-Dimer, Quant 0.96 (*)    All other components within normal limits  HEMOGLOBIN A1C - Abnormal; Notable for the following components:   Hgb A1c MFr Bld 8.3 (*)    All other components within normal limits  BASIC METABOLIC PANEL - Abnormal; Notable for the following components:   Sodium 134 (*)    Chloride 96 (*)    Glucose, Bld 209 (*)    All other components within normal limits  CBC - Abnormal;  Notable for the following components:   WBC 11.1 (*)    All other components within normal limits  GLUCOSE, CAPILLARY - Abnormal; Notable for the following components:   Glucose-Capillary 266 (*)    All other components within normal limits  GLUCOSE, CAPILLARY - Abnormal; Notable for the following components:   Glucose-Capillary 224 (*)    All other components within normal limits  CBG MONITORING, ED - Abnormal; Notable for the following components:   Glucose-Capillary 189 (*)    All other components within normal limits  CBG MONITORING, ED - Abnormal; Notable for the following components:   Glucose-Capillary 263 (*)    All other components within normal limits  CBG MONITORING, ED - Abnormal; Notable for the following components:   Glucose-Capillary 201 (*)    All other components within normal limits  CULTURE, BLOOD (ROUTINE X 2)  CULTURE, BLOOD (ROUTINE X 2)  RESPIRATORY PANEL BY RT PCR (FLU A&B, COVID)  LACTIC ACID, PLASMA  URINALYSIS, ROUTINE W REFLEX MICROSCOPIC  BRAIN NATRIURETIC PEPTIDE  HIV ANTIBODY (ROUTINE TESTING W REFLEX)  LACTIC ACID, PLASMA  MAGNESIUM  BASIC METABOLIC PANEL  CBC  MAGNESIUM   ____________________________________________  RADIOLOGY  None ____________________________________________   PROCEDURES  Procedure(s) performed:   Procedures  None ____________________________________________   INITIAL IMPRESSION / ASSESSMENT AND PLAN / ED COURSE  Pertinent labs & imaging results that were available during my care of the patient were reviewed by me and considered in my medical decision making (see chart for details).   Patient with right leg redness and new honey crusted drainage. Lactate elevated and leukocytosis noted. Normal vitals. Doubt sepsis clinically but symptoms not significantly improved overall after outpatient Keflex. Failing outpatient abx. Plan for admit for monitoring and IV abx.   Discussed patient's case with [facility] to request  admission. Patient and family (if present) updated with plan. Care transferred to [facility] service.  I reviewed all nursing notes, vitals, pertinent old records, EKGs, labs, imaging (as available).  ____________________________________________  FINAL CLINICAL IMPRESSION(S) / ED DIAGNOSES  Final diagnoses:  Cellulitis of leg, right     MEDICATIONS GIVEN DURING THIS VISIT:  Medications  allopurinol (ZYLOPRIM) tablet 200 mg (200 mg Oral Given 12/05/19 1100)  aspirin EC tablet 81 mg (81 mg Oral Given 12/05/19 1100)  colchicine tablet 0.6 mg (  has no administration in time range)  atorvastatin (LIPITOR) tablet 40 mg (40 mg Oral Given 12/05/19 1716)  metoprolol succinate (TOPROL-XL) 24 hr tablet 75 mg (75 mg Oral Given 12/05/19 1158)  prazosin (MINIPRESS) capsule 4 mg (4 mg Oral Given 12/04/19 2235)  ARIPiprazole (ABILIFY) tablet 5 mg (5 mg Oral Given 12/04/19 2236)  temazepam (RESTORIL) capsule 15 mg (15 mg Oral Not Given 12/04/19 2100)  traZODone (DESYREL) tablet 200 mg (200 mg Oral Given 12/04/19 2121)  venlafaxine XR (EFFEXOR-XR) 24 hr capsule 150 mg (150 mg Oral Given 12/05/19 1030)  ferrous sulfate tablet 324 mg (324 mg Oral Given 12/05/19 0814)  pregabalin (LYRICA) capsule 100 mg (100 mg Oral Given 12/05/19 1200)  loratadine (CLARITIN) tablet 10 mg (10 mg Oral Given 12/05/19 1100)  fluticasone (FLONASE) 50 MCG/ACT nasal spray 2 spray (2 sprays Each Nare Given 12/05/19 1400)  docusate sodium (COLACE) capsule 100 mg (100 mg Oral Given 12/05/19 1100)  senna-docusate (Senokot-S) tablet 1 tablet (has no administration in time range)  bisacodyl (DULCOLAX) suppository 10 mg (has no administration in time range)  ondansetron (ZOFRAN) tablet 4 mg (has no administration in time range)    Or  ondansetron (ZOFRAN) injection 4 mg (has no administration in time range)  albuterol (PROVENTIL) (2.5 MG/3ML) 0.083% nebulizer solution 2.5 mg (has no administration in time range)  insulin aspart (novoLOG) injection  0-15 Units (5 Units Subcutaneous Given 12/05/19 1714)  insulin aspart (novoLOG) injection 0-5 Units (3 Units Subcutaneous Given 12/04/19 2121)  furosemide (LASIX) injection 60 mg (60 mg Intravenous Given 12/05/19 1716)  enoxaparin (LOVENOX) injection 90 mg (90 mg Subcutaneous Given 12/04/19 2006)  acetaminophen (TYLENOL) tablet 650 mg (650 mg Oral Given 12/05/19 0515)  HYDROcodone-acetaminophen (NORCO/VICODIN) 5-325 MG per tablet 1-2 tablet (2 tablets Oral Given 12/05/19 1317)  clindamycin (CLEOCIN) IVPB 600 mg (600 mg Intravenous New Bag/Given 12/05/19 1318)  polyethylene glycol (MIRALAX / GLYCOLAX) packet 17 g (has no administration in time range)  insulin glargine (LANTUS) injection 10 Units (10 Units Subcutaneous Given 12/05/19 1030)  oxyCODONE-acetaminophen (PERCOCET/ROXICET) 5-325 MG per tablet 1 tablet (1 tablet Oral Given 12/04/19 1838)  sodium chloride flush (NS) 0.9 % injection 3 mL (3 mLs Intravenous Given 12/04/19 1838)  clindamycin (CLEOCIN) IVPB 600 mg (0 mg Intravenous Stopped 12/04/19 2006)    Note:  This document was prepared using Dragon voice recognition software and may include unintentional dictation errors.  Nanda Quinton, MD, [facility] Emergency Medicine    Sharlett Lienemann, [facility], MD 12/05/19 [phone]

## 2019-12-04 NOTE — ED Triage Notes (Signed)
Pt arrived by [facility] for bilateral lower extremity swelling. Recent antibiotic treatment that finished 4 days ago. For 2 days having increase in swelling to right leg with drainage. Denies fever.

## 2019-12-04 NOTE — ED Notes (Signed)
Dr Ralene Bathe was notified of lactic 20

## 2019-12-05 ENCOUNTER — Inpatient Hospital Stay (HOSPITAL_COMMUNITY): Payer: Medicare Other

## 2019-12-05 DIAGNOSIS — M7989 Other specified soft tissue disorders: Secondary | ICD-10-CM

## 2019-12-05 DIAGNOSIS — I878 Other specified disorders of veins: Secondary | ICD-10-CM

## 2019-12-05 DIAGNOSIS — L03115 Cellulitis of right lower limb: Principal | ICD-10-CM

## 2019-12-05 DIAGNOSIS — E1142 Type 2 diabetes mellitus with diabetic polyneuropathy: Secondary | ICD-10-CM

## 2019-12-05 DIAGNOSIS — I5032 Chronic diastolic (congestive) heart failure: Secondary | ICD-10-CM

## 2019-12-05 DIAGNOSIS — L0390 Cellulitis, unspecified: Secondary | ICD-10-CM | POA: Diagnosis not present

## 2019-12-05 DIAGNOSIS — E669 Obesity, unspecified: Secondary | ICD-10-CM

## 2019-12-05 DIAGNOSIS — L538 Other specified erythematous conditions: Secondary | ICD-10-CM

## 2019-12-05 DIAGNOSIS — E1169 Type 2 diabetes mellitus with other specified complication: Secondary | ICD-10-CM

## 2019-12-05 LAB — BASIC METABOLIC PANEL
Anion gap: 13 (ref 5–15)
BUN: 17 mg/dL (ref 8–23)
CO2: 25 mmol/L (ref 22–32)
Calcium: 8.9 mg/dL (ref 8.9–10.3)
Chloride: 96 mmol/L — ABNORMAL LOW (ref 98–111)
Creatinine, Ser: 0.88 mg/dL (ref 0.61–1.24)
GFR calc Af Amer: >60 mL/min (ref >60)
GFR calc non Af Amer: >60 mL/min (ref >60)
Glucose, Bld: 209 mg/dL — ABNORMAL HIGH (ref 70–99)
Potassium: 4.4 mmol/L (ref 3.5–5.1)
Sodium: 134 mmol/L — ABNORMAL LOW (ref 135–145)

## 2019-12-05 LAB — CBG MONITORING, ED: Glucose-Capillary: 201 mg/dL — ABNORMAL HIGH (ref 70–99)

## 2019-12-05 LAB — MAGNESIUM: Magnesium: 2.1 mg/dL (ref 1.7–2.4)

## 2019-12-05 LAB — CBC
HCT: 40.3 % (ref 39.0–52.0)
Hemoglobin: 13.1 g/dL (ref 13.0–17.0)
MCH: 29.2 pg (ref 26.0–34.0)
MCHC: 32.5 g/dL (ref 30.0–36.0)
MCV: 89.8 fL (ref 80.0–100.0)
Platelets: 249 10*3/uL (ref 150–400)
RBC: 4.49 MIL/uL (ref 4.22–5.81)
RDW: 14.7 % (ref 11.5–15.5)
WBC: 11.1 10*3/uL — ABNORMAL HIGH (ref 4.0–10.5)
nRBC: 0 % (ref 0.0–0.2)

## 2019-12-05 LAB — GLUCOSE, CAPILLARY
Glucose-Capillary: 266 mg/dL — ABNORMAL HIGH (ref 70–99)
Glucose-Capillary: 224 mg/dL — ABNORMAL HIGH (ref 70–99)
Glucose-Capillary: 197 mg/dL — ABNORMAL HIGH (ref 70–99)

## 2019-12-05 LAB — HEMOGLOBIN A1C
Hgb A1c MFr Bld: 8.3 % — ABNORMAL HIGH (ref 4.8–5.6)
Mean Plasma Glucose: 191.51 mg/dL

## 2019-12-05 LAB — LACTIC ACID, PLASMA: Lactic Acid, Venous: 1.5 mmol/L (ref 0.5–1.9)

## 2019-12-05 MED ORDER — POLYETHYLENE GLYCOL 3350 17 G PO PACK: 17.0000 g | PACK | Freq: Every day | ORAL | Status: DC | PRN

## 2019-12-05 MED ORDER — CLINDAMYCIN PHOSPHATE 600 MG/50ML IV SOLN
600.0000 mg | Freq: Three times a day (TID) | INTRAVENOUS | Status: DC
  Administered 2019-12-05 – 2019-12-07 (×6): 600 mg via INTRAVENOUS
  Filled 2019-12-05 (×6): qty 50

## 2019-12-05 MED ORDER — ACETAMINOPHEN 325 MG PO TABS
650.0000 mg | ORAL_TABLET | Freq: Four times a day (QID) | ORAL | Status: DC | PRN
  Administered 2019-12-05 – 2019-12-07 (×3): 650 mg via ORAL
  Filled 2019-12-05 (×3): qty 2

## 2019-12-05 MED ORDER — HYDROCODONE-ACETAMINOPHEN 5-325 MG PO TABS
1.0000 | ORAL_TABLET | Freq: Four times a day (QID) | ORAL | Status: DC | PRN
  Administered 2019-12-05 (×2): 2 via ORAL
  Administered 2019-12-05: 1 via ORAL
  Administered 2019-12-05 – 2019-12-07 (×7): 2 via ORAL
  Filled 2019-12-05 (×4): qty 2
  Filled 2019-12-05: qty 1
  Filled 2019-12-05 (×5): qty 2

## 2019-12-05 MED ORDER — INSULIN GLARGINE 100 UNIT/ML ~~LOC~~ SOLN
10.0000 [IU] | Freq: Every day | SUBCUTANEOUS | Status: DC
  Administered 2019-12-05 – 2019-12-06 (×2): 10 [IU] via SUBCUTANEOUS
  Filled 2019-12-05 (×2): qty 0.1

## 2019-12-05 NOTE — ED Notes (Signed)
Checked Pt's CBG before he ate. Pt now eating his breakfast.

## 2019-12-05 NOTE — Progress Notes (Signed)
Pt arrived to room 5n20, alert/oriented in no acute distress. Pt orientated to room/equipments. Pt guide/menu provided. Pt has been informed that facility is not reponsible for any losses/damages to his cellphone/kindle. And has the options to hand it to security for accounting and safe keeping. All wheels locked and 3 side rails up, call bell/room phone within reach. No complaints.

## 2019-12-05 NOTE — Progress Notes (Signed)
Lower venous duplex       has been completed. Preliminary results can be found under CV proc through chart review. Ary Rudnick, BS, RDMS, RVT   

## 2019-12-05 NOTE — Progress Notes (Addendum)
Approximated 1622, this wirter received a call from 5n20, Pt stated "I'm not feeling well". Went to see pt. Alert/oriented in no apparent distress noted at this time, Pt stated further " I'm queasy and slightly short of breath, denies any chest pain/palpitations, no nausea/vomiting noted. [place]@3L  provided, VSS(see flowsheets).Attending MD notified of above with no new orders at this time.

## 2019-12-05 NOTE — ED Notes (Signed)
Attempted to call report x 2; nurse unable to take report.

## 2019-12-05 NOTE — Progress Notes (Signed)
PROGRESS NOTE    Lawrence [name]  [id number] DOB: 05-07-1956 DOA: 12/04/2019 PCP: [facility]   Brief Narrative:  64 year old with history of diastolic CHF, COPD, CAD, DM 2, PTSD/anxiety, BPH, HTN, chronic venous insufficiency recently diagnosed with right lower extremity cellulitis.  Received 7 days of Keflex but failed outpatient treatment therefore comes to the hospital.Patient was started on clindamycin.   Assessment & Plan:   Active Problems:   Diabetes mellitus type 2 in obese (HCC)   Chronic diastolic CHF (congestive heart failure) (HCC)   Chronic venous stasis   Diabetic neuropathy (HCC)   Cellulitis of right leg  Right lower extremity nonpurulent cellulitis- Patient failed outpatient treatment with 7 days of Keflex Allergic to vancomycin therefore on IV clindamycin here After completion of antibiotic, will start probiotics IV Lasix, leg elevation Right lower extremity Dopplers = negative prelim read Wound care, Unna boots  Acute on chronic congestive heart failure with preserved ejection fraction, 55% Appears to be slightly volume overloaded therefore on IV Lasix Fluid restriction, monitor and replete electrolytes as needed Echocardiogram 7/20-EF 55 to [id number] with diastolic dysfunction  Chronic COPD As needed bronchodilators  History of CAD:  LHC in 2019 with nonobstructive CAD.  No anginal symptoms. -Continue home medications.  Diabetes mellitus type 2, uncontrolled secondary to hyperglycemia Insulin sliding scale Accu-Chek Check hemoglobin A1c Home p.o. meds on hold, add Lantus 10 units daily  Essential hypertension -Continue home meds  Obstructive sleep apnea -CPAP nightly  History of PTSD/anxiety -Continue home meds  BPH On prazosin  Morbid obesity with BMI greater than 54 Diet control and exercise  DVT prophylaxis: Lovenox Code Status: DNR Family Communication: None at bedside  Remains inpatient appropriate because:IV treatments  appropriate due to intensity of illness or inability to take PO   Dispo: The patient is from: Home              Anticipated d/c is to: Home              Anticipated d/c date is: 2 days              Patient currently is not medically stable to d/c.    Subjective: Reporting of right lower extremity pain and discomfort with erythema.  Difficult to bear weight on this  Review of Systems Otherwise negative except as per HPI, including: General: Denies fever, chills, night sweats or unintended weight loss. Resp: Denies cough, wheezing, shortness of breath. Cardiac: Denies chest pain, palpitations, orthopnea, paroxysmal nocturnal dyspnea. GI: Denies abdominal pain, nausea, vomiting, diarrhea or constipation GU: Denies dysuria, frequency, hesitancy or incontinence MS: Denies muscle aches, joint pain or swelling Neuro: Denies headache, neurologic deficits (focal weakness, numbness, tingling), abnormal gait Psych: Denies anxiety, depression, SI/HI/AVH Skin: Denies new rashes or lesions ID: Denies sick contacts, exotic exposures, travel  Examination:  General exam: Appears calm and comfortable  Respiratory system: Clear to auscultation. Respiratory effort normal. Cardiovascular system: S1 & S2 heard, RRR. No JVD, murmurs, rubs, gallops or clicks. No pedal edema. Gastrointestinal system: Abdomen is nondistended, soft and nontender. No organomegaly or masses felt. Normal bowel sounds heard. Central nervous system: Alert and oriented. No focal neurological deficits. Extremities: Symmetric 5 x 5 power. Skin: Bilateral lower extremity erythema much severe on the right lower extremity with some superficial purulent wound drainage.  It is also warm to touch Psychiatry: Judgement and insight appear normal. Mood & affect appropriate.     Objective: Vitals:   12/05/19 0615 12/05/19 0630 12/05/19 0700  12/05/19 0715  BP: 133/66 124/71 103/71 123/72  Pulse: 88 85 88 81  Resp: 15 15 13 15   Temp:       TempSrc:      SpO2: 93% 91% 93% 92%  Weight:      Height:        Intake/Output Summary (Last 24 hours) at 12/05/2019 0731 Last data filed at 12/04/2019 2226 Gross per 24 hour  Intake 100 ml  Output 550 ml  Net -450 ml   Filed Weights   12/04/19 0838  Weight: (!) 193.7 kg     Data Reviewed:   CBC: Recent Labs  Lab 12/04/19 0850 12/05/19 0602  WBC 12.9* 11.1*  NEUTROABS 10.0*  --   HGB 12.9* 13.1  HCT 41.6 40.3  MCV 92.9 89.8  PLT 264 0000000   Basic Metabolic Panel: Recent Labs  Lab 12/04/19 0850 12/05/19 0602  NA 134* 134*  K 4.5 4.4  CL 94* 96*  CO2 29 25  GLUCOSE 209* 209*  BUN 23 17  CREATININE 0.95 0.88  CALCIUM 9.2 8.9  MG  --  2.1   GFR: Estimated Creatinine Clearance: 156.1 mL/min (by C-G formula based on SCr of 0.88 mg/dL). Liver Function Tests: Recent Labs  Lab 12/04/19 0850  AST 15  ALT 21  ALKPHOS 70  BILITOT 0.6  PROT 6.4*  ALBUMIN 3.5   No results for input(s): LIPASE, AMYLASE in the last 168 hours. No results for input(s): AMMONIA in the last 168 hours. Coagulation Profile: No results for input(s): INR, PROTIME in the last 168 hours. Cardiac Enzymes: No results for input(s): CKTOTAL, CKMB, CKMBINDEX, TROPONINI in the last 168 hours. BNP (last 3 results) No results for input(s): PROBNP in the last 8760 hours. HbA1C: No results for input(s): HGBA1C in the last 72 hours. CBG: Recent Labs  Lab 12/04/19 1146 12/04/19 2108  GLUCAP 189* 263*   Lipid Profile: No results for input(s): CHOL, HDL, LDLCALC, TRIG, CHOLHDL, LDLDIRECT in the last 72 hours. Thyroid Function Tests: No results for input(s): TSH, T4TOTAL, FREET4, T3FREE, THYROIDAB in the last 72 hours. Anemia Panel: No results for input(s): VITAMINB12, FOLATE, FERRITIN, TIBC, IRON, RETICCTPCT in the last 72 hours. Sepsis Labs: Recent Labs  Lab 12/04/19 0850 12/04/19 1833 12/05/19 0602  LATICACIDVEN 2.0* 1.9 1.5    Recent Results (from the past 240 hour(s))   Respiratory Panel by RT PCR (Flu A&B, Covid) - Nasopharyngeal Swab     Status: None   Collection Time: 12/04/19  5:49 PM   Specimen: Nasopharyngeal Swab  Result Value Ref Range Status   SARS Coronavirus 2 by RT PCR NEGATIVE NEGATIVE Final    Comment: (NOTE) SARS-CoV-2 target nucleic acids are NOT DETECTED. The SARS-CoV-2 RNA is generally detectable in upper respiratoy specimens during the acute phase of infection. The lowest concentration of SARS-CoV-2 viral copies this assay can detect is 131 copies/mL. A negative result does not preclude SARS-Cov-2 infection and should not be used as the sole basis for treatment or other patient management decisions. A negative result may occur with  improper specimen collection/handling, submission of specimen other than nasopharyngeal swab, presence of viral mutation(s) within the areas targeted by this assay, and inadequate number of viral copies (<131 copies/mL). A negative result must be combined with clinical observations, patient history, and epidemiological information. The expected result is Negative. Fact Sheet for Patients:  PinkCheek.be Fact Sheet for Healthcare Providers:  GravelBags.it This test is not yet ap proved or cleared by the [place]  FDA and  has been authorized for detection and/or diagnosis of SARS-CoV-2 by FDA under an Emergency Use Authorization (EUA). This EUA will remain  in effect (meaning this test can be used) for the duration of the COVID-19 declaration under Section 564(b)(1) of the Act, 21 U.S.C. section 360bbb-3(b)(1), unless the authorization is terminated or revoked sooner.    Influenza A by PCR NEGATIVE NEGATIVE Final   Influenza B by PCR NEGATIVE NEGATIVE Final    Comment: (NOTE) The Xpert Xpress SARS-CoV-2/FLU/RSV assay is intended as an aid in  the diagnosis of influenza from Nasopharyngeal swab specimens and  should not be used as a sole  basis for treatment. Nasal washings and  aspirates are unacceptable for Xpert Xpress SARS-CoV-2/FLU/RSV  testing. Fact Sheet for Patients: PinkCheek.be Fact Sheet for Healthcare Providers: GravelBags.it This test is not yet approved or cleared by the [place] FDA and  has been authorized for detection and/or diagnosis of SARS-CoV-2 by  FDA under an Emergency Use Authorization (EUA). This EUA will remain  in effect (meaning this test can be used) for the duration of the  Covid-19 declaration under Section 564(b)(1) of the Act, 21  U.S.C. section 360bbb-3(b)(1), unless the authorization is  terminated or revoked. Performed at [facility], [place] [address], [place], [place] [address]          Radiology Studies: No results found.      Scheduled Meds: . allopurinol  200 mg Oral Daily  . ARIPiprazole  5 mg Oral QHS  . aspirin EC  81 mg Oral Daily  . atorvastatin  40 mg Oral q1800  . docusate sodium  100 mg Oral BID  . enoxaparin (LOVENOX) injection  90 mg Subcutaneous Q24H  . ferrous sulfate  324 mg Oral Q breakfast  . fluticasone  2 spray Each Nare Daily  . furosemide  60 mg Intravenous BID  . insulin aspart  0-15 Units Subcutaneous TID WC  . insulin aspart  0-5 Units Subcutaneous QHS  . loratadine  10 mg Oral Daily  . metoprolol succinate  75 mg Oral BID  . prazosin  4 mg Oral QHS  . pregabalin  100 mg Oral Daily  . temazepam  15 mg Oral QHS  . traZODone  200 mg Oral QHS  . venlafaxine XR  150 mg Oral Q breakfast   Continuous Infusions: . clindamycin (CLEOCIN) IV       LOS: 1 day   Time spent= 35 mins    Arica Bevilacqua Arsenio Loader, MD [facility]  If 7PM-7AM, please contact night-coverage  12/05/2019, 7:31 AM

## 2019-12-05 NOTE — ED Notes (Signed)
Breakfast ordered 

## 2019-12-05 NOTE — Consult Note (Signed)
WOC Nurse Consult Note: Reason for Consult: Bilateral LE erythema, edema with partial thickness skin breakdown, R>L. Cellulitis, patient failed oral antibiotic therapy in community. To be admitted for IV antibiotic therpay Wound type:Venous and arterial insufficiency, infectious Pressure Injury POA: N/A Measurement: RLE with 15cm x 10cm area of erythema with linear excoriations and scattered areas of dried serum (scabbing). No exudate. LLE with 9cm x 6cm area of erythema with scattered areas of dried serum (scabbing), the largest of which measures 0.5cm round. No exudate. Wound bed:As described above. Drainage (amount, consistency, odor): N/A Periwound: Hemosiderin staining, edema, ertythema Dressing procedure/placement/frequency: Two ABIs have been obtained in the past, the latest in 2019 which Dr. Doren Custard noted illustrated some mild arterial insufficiency in addition to the venous insufficiency.  In the presence of mixed etiology, I will add a mild compression via ACE bandaging with a topical POC including soap and water cleansing, rinsing and patting dry followed by the application of an antimicrobial wound contact layer (xeroform gauze). This is to be changed twice daily.  Heels are to be floated off of the bed surface to prevent pressure injury. A sacral prophylactic foam dressing will be placed to prevent pressure injury and a bariatric bed will be provided to accommodate his body habitus and weight.  [place] nursing team will not follow, but will remain available to this patient, the nursing and medical teams.  Please re-consult if needed. Thanks, Maudie Flakes, MSN, RN, [place], Arther Abbott  Pager# [phone]

## 2019-12-05 NOTE — Plan of Care (Signed)
  Problem: Education: Goal: Knowledge of General Education information will improve Description: Including pain rating scale, medication(s)/side effects and non-pharmacologic comfort measures Outcome: Progressing   Problem: Health Behavior/Discharge Planning: Goal: Ability to manage health-related needs will improve Outcome: Progressing   Problem: Activity: Goal: Risk for activity intolerance will decrease Outcome: Progressing   Problem: Safety: Goal: Ability to remain free from injury will improve Outcome: Progressing   Problem: Skin Integrity: Goal: Risk for impaired skin integrity will decrease Outcome: Progressing   

## 2019-12-05 NOTE — ED Notes (Signed)
5N unavailable to take report at this time.

## 2019-12-06 LAB — BASIC METABOLIC PANEL
Anion gap: 9 (ref 5–15)
BUN: 15 mg/dL (ref 8–23)
CO2: 29 mmol/L (ref 22–32)
Calcium: 8.6 mg/dL — ABNORMAL LOW (ref 8.9–10.3)
Chloride: 98 mmol/L (ref 98–111)
Creatinine, Ser: 0.83 mg/dL (ref 0.61–1.24)
GFR calc Af Amer: >60 mL/min (ref >60)
GFR calc non Af Amer: >60 mL/min (ref >60)
Glucose, Bld: 205 mg/dL — ABNORMAL HIGH (ref 70–99)
Potassium: 3.9 mmol/L (ref 3.5–5.1)
Sodium: 136 mmol/L (ref 135–145)

## 2019-12-06 LAB — CBC
HCT: 38.7 % — ABNORMAL LOW (ref 39.0–52.0)
Hemoglobin: 12.5 g/dL — ABNORMAL LOW (ref 13.0–17.0)
MCH: 29.3 pg (ref 26.0–34.0)
MCHC: 32.3 g/dL (ref 30.0–36.0)
MCV: 90.6 fL (ref 80.0–100.0)
Platelets: 239 10*3/uL (ref 150–400)
RBC: 4.27 MIL/uL (ref 4.22–5.81)
RDW: 14.7 % (ref 11.5–15.5)
WBC: 9.6 10*3/uL (ref 4.0–10.5)
nRBC: 0 % (ref 0.0–0.2)

## 2019-12-06 LAB — GLUCOSE, CAPILLARY
Glucose-Capillary: 234 mg/dL — ABNORMAL HIGH (ref 70–99)
Glucose-Capillary: 213 mg/dL — ABNORMAL HIGH (ref 70–99)
Glucose-Capillary: 211 mg/dL — ABNORMAL HIGH (ref 70–99)
Glucose-Capillary: 209 mg/dL — ABNORMAL HIGH (ref 70–99)

## 2019-12-06 LAB — MAGNESIUM: Magnesium: 2 mg/dL (ref 1.7–2.4)

## 2019-12-06 MED ORDER — INSULIN GLARGINE 100 UNIT/ML ~~LOC~~ SOLN
14.0000 [IU] | Freq: Every day | SUBCUTANEOUS | Status: DC
  Administered 2019-12-07: 14 [IU] via SUBCUTANEOUS
  Filled 2019-12-06: qty 0.14

## 2019-12-06 NOTE — Progress Notes (Signed)
PROGRESS NOTE    Lawrence [name]  [id number] DOB: 10-May-1956 DOA: 12/04/2019 PCP: [facility]   Brief Narrative:  64 year old with history of diastolic CHF, COPD, CAD, DM 2, PTSD/anxiety, BPH, HTN, chronic venous insufficiency recently diagnosed with right lower extremity cellulitis.  Received 7 days of Keflex but failed outpatient treatment therefore comes to the hospital.Patient was started on clindamycin.   Assessment & Plan:   Active Problems:   Diabetes mellitus type 2 in obese (HCC)   Chronic diastolic CHF (congestive heart failure) (HCC)   Chronic venous stasis   Diabetic neuropathy (HCC)   Cellulitis of right leg  Right lower extremity nonpurulent cellulitis- Patient failed outpatient treatment with 7 days of Keflex Continue IV clindamycin for another 24 hours then transition to p.o. After completion of antibiotic, will start probiotics Right lower extremity Dopplers = negative prelim read Wound care, Unna boots  Acute on chronic congestive heart failure with preserved ejection fraction, 55% Much better today.  Continue 1 more day of Lasix 60 mg IV twice daily Fluid restriction, monitor and replete electrolytes as needed Echocardiogram 7/20-EF 55 to [id number] with diastolic dysfunction  Chronic COPD As needed bronchodilators  History of CAD:  LHC in 2019 with nonobstructive CAD.  No anginal symptoms. -Continue home medications.  Diabetes mellitus type 2, uncontrolled secondary to hyperglycemia Insulin sliding scale Accu-Chek A1c 8.3 Home p.o. meds on hold, increase Lantus to 14 units daily  Essential hypertension -Continue home meds  Obstructive sleep apnea -CPAP nightly  History of PTSD/anxiety -Continue home meds  BPH On prazosin  Morbid obesity with BMI greater than 54 Diet control and exercise  DVT prophylaxis: Lovenox Code Status: DNR Family Communication: Significant other at bedside  Remains inpatient appropriate because:IV treatments  appropriate due to intensity of illness or inability to take PO   Dispo: The patient is from: Home              Anticipated d/c is to: Home              Anticipated d/c date is: 2 days              Patient currently is not medically stable to d/c.  Continue IV antibiotic for 24 more hours in the meantime get PT/OT evaluation.    Subjective: Still has right lower extremity pain and mild left lower extremity pain but improved compared to yesterday.  Having some difficulty bearing weight on it due to the discomfort  Review of Systems Otherwise negative except as per HPI, including: General: Denies fever, chills, night sweats or unintended weight loss. Resp: Denies cough, wheezing, shortness of breath. Cardiac: Denies chest pain, palpitations, orthopnea, paroxysmal nocturnal dyspnea. GI: Denies abdominal pain, nausea, vomiting, diarrhea or constipation GU: Denies dysuria, frequency, hesitancy or incontinence MS: Denies muscle aches, joint pain or swelling Neuro: Denies headache, neurologic deficits (focal weakness, numbness, tingling), abnormal gait Psych: Denies anxiety, depression, SI/HI/AVH Skin: Denies new rashes or lesions ID: Denies sick contacts, exotic exposures, travel  Examination: Constitutional: Not in acute distress, morbid obesity Respiratory: Clear to auscultation bilaterally Cardiovascular: Normal sinus rhythm, no rubs Abdomen: Nontender nondistended good bowel sounds Musculoskeletal: No edema noted Skin: Bilateral lower extremity erythema much less tender to touch today.  Slightly warm.  Dressing in place. Neurologic: CN 2-12 grossly intact.  And nonfocal Psychiatric: Normal judgment and insight. Alert and oriented x 3. Normal mood.  Objective: Vitals:   12/05/19 2248 12/06/19 0328 12/06/19 0811 12/06/19 0854  BP:  127/70  129/72  Pulse:  77 82 83  Resp:  18  19  Temp:  97.9 F (36.6 C)  98.7 F (37.1 C)  TempSrc:    Oral  SpO2: 95% 92%  92%  Weight:        Height:        Intake/Output Summary (Last 24 hours) at 12/06/2019 1106 Last data filed at 12/06/2019 1032 Gross per 24 hour  Intake 770 ml  Output 3850 ml  Net -3080 ml   Filed Weights   12/04/19 0838  Weight: (!) 193.7 kg     Data Reviewed:   CBC: Recent Labs  Lab 12/04/19 0850 12/05/19 0602 12/06/19 0306  WBC 12.9* 11.1* 9.6  NEUTROABS 10.0*  --   --   HGB 12.9* 13.1 12.5*  HCT 41.6 40.3 38.7*  MCV 92.9 89.8 90.6  PLT 264 249 [id number]   Basic Metabolic Panel: Recent Labs  Lab 12/04/19 0850 12/05/19 0602 12/06/19 0306  NA 134* 134* 136  K 4.5 4.4 3.9  CL 94* 96* 98  CO2 29 25 29   GLUCOSE 209* 209* 205*  BUN 23 17 15   CREATININE 0.95 0.88 0.83  CALCIUM 9.2 8.9 8.6*  MG  --  2.1 2.0   GFR: Estimated Creatinine Clearance: 165.5 mL/min (by C-G formula based on SCr of 0.83 mg/dL). Liver Function Tests: Recent Labs  Lab 12/04/19 0850  AST 15  ALT 21  ALKPHOS 70  BILITOT 0.6  PROT 6.4*  ALBUMIN 3.5   No results for input(s): LIPASE, AMYLASE in the last 168 hours. No results for input(s): AMMONIA in the last 168 hours. Coagulation Profile: No results for input(s): INR, PROTIME in the last 168 hours. Cardiac Enzymes: No results for input(s): CKTOTAL, CKMB, CKMBINDEX, TROPONINI in the last 168 hours. BNP (last 3 results) No results for input(s): PROBNP in the last 8760 hours. HbA1C: Recent Labs    12/05/19 0602  HGBA1C 8.3*   CBG: Recent Labs  Lab 12/05/19 0747 12/05/19 1150 12/05/19 1638 12/05/19 2131 12/06/19 0805  GLUCAP 201* 266* 224* 197* 234*   Lipid Profile: No results for input(s): CHOL, HDL, LDLCALC, TRIG, CHOLHDL, LDLDIRECT in the last 72 hours. Thyroid Function Tests: No results for input(s): TSH, T4TOTAL, FREET4, T3FREE, THYROIDAB in the last 72 hours. Anemia Panel: No results for input(s): VITAMINB12, FOLATE, FERRITIN, TIBC, IRON, RETICCTPCT in the last 72 hours. Sepsis Labs: Recent Labs  Lab 12/04/19 0850 12/04/19 1833  12/05/19 0602  LATICACIDVEN 2.0* 1.9 1.5    Recent Results (from the past 240 hour(s))  Culture, blood (routine x 2)     Status: None (Preliminary result)   Collection Time: 12/04/19  5:18 PM   Specimen: BLOOD RIGHT HAND  Result Value Ref Range Status   Specimen Description BLOOD RIGHT HAND  Final   Special Requests   Final    BOTTLES DRAWN AEROBIC AND ANAEROBIC Blood Culture results may not be optimal due to an inadequate volume of blood received in culture bottles   Culture   Final    NO GROWTH < 12 HOURS Performed at [facility], [place] [address], [place], [place] [address]    Report Status PENDING  Incomplete  Culture, blood (routine x 2)     Status: None (Preliminary result)   Collection Time: 12/04/19  5:23 PM   Specimen: BLOOD RIGHT HAND  Result Value Ref Range Status   Specimen Description BLOOD RIGHT HAND  Final   Special Requests   Final  BOTTLES DRAWN AEROBIC AND ANAEROBIC Blood Culture adequate volume   Culture   Final    NO GROWTH < 12 HOURS Performed at [facility], [place] [address], [place], [place] [address]    Report Status PENDING  Incomplete  Respiratory Panel by RT PCR (Flu A&B, Covid) - Nasopharyngeal Swab     Status: None   Collection Time: 12/04/19  5:49 PM   Specimen: Nasopharyngeal Swab  Result Value Ref Range Status   SARS Coronavirus 2 by RT PCR NEGATIVE NEGATIVE Final    Comment: (NOTE) SARS-CoV-2 target nucleic acids are NOT DETECTED. The SARS-CoV-2 RNA is generally detectable in upper respiratoy specimens during the acute phase of infection. The lowest concentration of SARS-CoV-2 viral copies this assay can detect is 131 copies/mL. A negative result does not preclude SARS-Cov-2 infection and should not be used as the sole basis for treatment or other patient management decisions. A negative result may occur with  improper specimen collection/handling, submission of specimen other than nasopharyngeal swab, presence of viral  mutation(s) within the areas targeted by this assay, and inadequate number of viral copies (<131 copies/mL). A negative result must be combined with clinical observations, patient history, and epidemiological information. The expected result is Negative. Fact Sheet for Patients:  PinkCheek.be Fact Sheet for Healthcare Providers:  GravelBags.it This test is not yet ap proved or cleared by the [place] FDA and  has been authorized for detection and/or diagnosis of SARS-CoV-2 by FDA under an Emergency Use Authorization (EUA). This EUA will remain  in effect (meaning this test can be used) for the duration of the COVID-19 declaration under Section 564(b)(1) of the Act, 21 U.S.C. section 360bbb-3(b)(1), unless the authorization is terminated or revoked sooner.    Influenza A by PCR NEGATIVE NEGATIVE Final   Influenza B by PCR NEGATIVE NEGATIVE Final    Comment: (NOTE) The Xpert Xpress SARS-CoV-2/FLU/RSV assay is intended as an aid in  the diagnosis of influenza from Nasopharyngeal swab specimens and  should not be used as a sole basis for treatment. Nasal washings and  aspirates are unacceptable for Xpert Xpress SARS-CoV-2/FLU/RSV  testing. Fact Sheet for Patients: PinkCheek.be Fact Sheet for Healthcare Providers: GravelBags.it This test is not yet approved or cleared by the [place] FDA and  has been authorized for detection and/or diagnosis of SARS-CoV-2 by  FDA under an Emergency Use Authorization (EUA). This EUA will remain  in effect (meaning this test can be used) for the duration of the  Covid-19 declaration under Section 564(b)(1) of the Act, 21  U.S.C. section 360bbb-3(b)(1), unless the authorization is  terminated or revoked. Performed at [facility], [place] [address], [place], [place] [address]          Radiology Studies: VAS [place] LOWER  EXTREMITY VENOUS (DVT)  Result Date: 12/05/2019  Lower Venous DVTStudy Indications: Swelling, Erythema, and cellulitis.  Limitations: Body habitus and poor ultrasound/tissue interface. Comparison Study: 01/15/18 negative Performing Technologist: June Leap RDMS, RVT  Examination Guidelines: A complete evaluation includes B-mode imaging, spectral Doppler, color Doppler, and power Doppler as needed of all accessible portions of each vessel. Bilateral testing is considered an integral part of a complete examination. Limited examinations for reoccurring indications may be performed as noted. The reflux portion of the exam is performed with the patient in reverse Trendelenburg.  +---------+---------------+---------+-----------+----------+--------------+ RIGHT    CompressibilityPhasicitySpontaneityPropertiesThrombus Aging +---------+---------------+---------+-----------+----------+--------------+ CFV      Full           Yes  Yes                                 +---------+---------------+---------+-----------+----------+--------------+ SFJ      Full                                                        +---------+---------------+---------+-----------+----------+--------------+ FV Prox  Full                                                        +---------+---------------+---------+-----------+----------+--------------+ FV Mid   Full                                                        +---------+---------------+---------+-----------+----------+--------------+ FV DistalFull                                                        +---------+---------------+---------+-----------+----------+--------------+ PFV      Full                                                        +---------+---------------+---------+-----------+----------+--------------+ POP      Full           Yes      Yes                                  +---------+---------------+---------+-----------+----------+--------------+ PTV                                                   Not visualized +---------+---------------+---------+-----------+----------+--------------+ PERO                                                  Not visualized +---------+---------------+---------+-----------+----------+--------------+   +----+---------------+---------+-----------+----------+--------------+ LEFTCompressibilityPhasicitySpontaneityProperties               +----+---------------+---------+-----------+----------+--------------+ CFV                                              Not visualized +----+---------------+---------+-----------+----------+--------------+     Summary: RIGHT: - There is no evidence of deep vein thrombosis in the lower extremity. However, portions of this examination were limited- see technologist  comments above.  - No cystic structure found in the popliteal fossa.   *See table(s) above for measurements and observations. Electronically signed by Curt Jews MD on 12/05/2019 at 4:30:58 PM.    Final         Scheduled Meds: . allopurinol  200 mg Oral Daily  . ARIPiprazole  5 mg Oral QHS  . aspirin EC  81 mg Oral Daily  . atorvastatin  40 mg Oral q1800  . docusate sodium  100 mg Oral BID  . enoxaparin (LOVENOX) injection  90 mg Subcutaneous Q24H  . ferrous sulfate  324 mg Oral Q breakfast  . fluticasone  2 spray Each Nare Daily  . furosemide  60 mg Intravenous BID  . insulin aspart  0-15 Units Subcutaneous TID WC  . insulin aspart  0-5 Units Subcutaneous QHS  . insulin glargine  10 Units Subcutaneous Daily  . loratadine  10 mg Oral Daily  . metoprolol succinate  75 mg Oral BID  . prazosin  4 mg Oral QHS  . pregabalin  100 mg Oral Daily  . temazepam  15 mg Oral QHS  . traZODone  200 mg Oral QHS  . venlafaxine XR  150 mg Oral Q breakfast   Continuous Infusions: . clindamycin (CLEOCIN) IV 600 mg (12/06/19  0555)     LOS: 2 days   Time spent= 35 mins    Akshay Spang Arsenio Loader, MD [facility]  If 7PM-7AM, please contact night-coverage  12/06/2019, 11:06 AM

## 2019-12-06 NOTE — Evaluation (Signed)
Physical Therapy Evaluation Patient Details Name: Lawrence [name] MRN: [id number] DOB: May 17, 1956 Today's Date: 12/06/2019   History of Present Illness  Pt is 64 year old with history of diastolic CHF, COPD, CAD, DM 2, PTSD/anxiety, BPH, HTN, chronic venous insufficiency recently diagnosed with right lower extremity cellulitis.  Received 7 days of Keflex but failed outpatient treatment therefore comes to the hospital.Patient was started on clindamycin.  Clinical Impression  Pt admitted with above diagnosis. Pt presenting with mild deficits in mobility and endurance compared to baseline that will benefit from acute PT services to address.  He needed min cues for transfer techniques and fatigued easily with gait requiring rest break.  Ambulated 69' with bariatric RW.  Pt with good support at home with necessary DME. Pt currently with functional limitations due to the deficits listed below (see PT Problem List). Pt will benefit from skilled PT to increase their independence and safety with mobility to allow discharge to the venue listed below.       Follow Up Recommendations Home health PT-pt was getting HHPT with [facility]    Equipment Recommendations  None recommended by PT    Recommendations for Other Services       Precautions / Restrictions Precautions Precautions: Fall      Mobility  Bed Mobility Overal bed mobility: Needs Assistance Bed Mobility: Supine to Sit;Sit to Supine     Supine to sit: Min assist Sit to supine: Min assist   General bed mobility comments: Min A for hand to pull up on to sit and min A for legs to lay  Transfers Overall transfer level: Needs assistance Equipment used: Rolling walker (2 wheeled) Transfers: Sit to/from Stand Sit to Stand: Min assist;From elevated surface         General transfer comment: sit to stand x 2; PT stabilized RW  Ambulation/Gait Ambulation/Gait assistance: Min Web designer (Feet): 12 Feet Assistive device:  Rolling walker (2 wheeled) Gait Pattern/deviations: Decreased stride length;Wide base of support;Trunk flexed Gait velocity: decreased   General Gait Details: easily fatigued; cues for posture  Stairs            Wheelchair Mobility    Modified Rankin (Stroke Patients Only)       Balance Overall balance assessment: Needs assistance   Sitting balance-Leahy Scale: Normal     Standing balance support: Single extremity supported Standing balance-Leahy Scale: Good                               Pertinent Vitals/Pain Pain Assessment: 0-10 Pain Score: 4  Pain Location: R leg Pain Descriptors / Indicators: Sore Pain Intervention(s): Limited activity within patient's tolerance;Monitored during session;Premedicated before session    Home Living Family/patient expects to be discharged to:: Private residence Living Arrangements: Spouse/significant other Available Help at Discharge: Family;Available 24 hours/day(girlfriend and nurse [phone] on M,T,W,F) Type of Home: Apartment Home Access: Level entry     Home Layout: One level Home Equipment: Walker - 4 wheels;Electric scooter;Hospital bed Additional Comments: has shower seat and bsc but they won't fit in bathroom    Prior Function Level of Independence: Needs assistance   Gait / Transfers Assistance Needed: Uses rollator to ambulate in apartment and electric scooter for longer distances  ADL's / Homemaking Assistance Needed: HHaide assist with bathing; Has assist at time with toileting and dressing  Comments: His [facility] for HHPT     Hand Dominance        Extremity/Trunk Assessment  Upper Extremity Assessment Upper Extremity Assessment: Overall WFL for tasks assessed    Lower Extremity Assessment Lower Extremity Assessment: LLE deficits/detail;RLE deficits/detail RLE Deficits / Details: ROM WFL; MMT 4+/5 LLE Deficits / Details: ROM WFL; MMT 4+/5    Cervical / Trunk Assessment Cervical /  Trunk Assessment: Normal  Communication   Communication: No difficulties  Cognition Arousal/Alertness: Awake/alert Behavior During Therapy: WFL for tasks assessed/performed Overall Cognitive Status: Within Functional Limits for tasks assessed                                        General Comments General comments (skin integrity, edema, etc.): Pt was on 2 LPM O2 at rest with sats 98%; placed on RA and sats 93%; left on RA    Exercises     Assessment/Plan    PT Assessment Patient needs continued PT services  PT Problem List Decreased strength;Decreased mobility;Decreased range of motion;Decreased activity tolerance;Cardiopulmonary status limiting activity;Decreased balance;Decreased knowledge of use of DME       PT Treatment Interventions DME instruction;Therapeutic activities;Gait training;Therapeutic exercise;Patient/family education;Balance training;Functional mobility training    PT Goals (Current goals can be found in the Care Plan section)  Acute Rehab PT Goals Patient Stated Goal: return home and cont HHPT PT Goal Formulation: With patient/family Time For Goal Achievement: 12/20/19 Potential to Achieve Goals: Good    Frequency Min 3X/week   Barriers to discharge        Co-evaluation               AM-PAC PT "6 Clicks" Mobility  Outcome Measure Help needed turning from your back to your side while in a flat bed without using bedrails?: A Little Help needed moving from lying on your back to sitting on the side of a flat bed without using bedrails?: A Little Help needed moving to and from a bed to a chair (including a wheelchair)?: A Little Help needed standing up from a chair using your arms (e.g., wheelchair or bedside chair)?: A Little Help needed to walk in hospital room?: A Little Help needed climbing 3-5 steps with a railing? : A Lot 6 Click Score: 17    End of Session   Activity Tolerance: Patient tolerated treatment well Patient  left: in bed;with call bell/phone within reach;with family/visitor present Nurse Communication: Mobility status PT Visit Diagnosis: Unsteadiness on feet (R26.81);Muscle weakness (generalized) (M62.81)    Time: [id number] PT Time Calculation (min) (ACUTE ONLY): 23 min   Charges:   PT Evaluation $PT Eval Low Complexity: 1 Low PT Treatments $Therapeutic Activity: 8-22 mins        Maggie Font, PT Acute Rehab Services Pager [phone] [facility] [phone] [facility] [phone]   Karlton Lemon 12/06/2019, 4:33 PM

## 2019-12-06 NOTE — Plan of Care (Signed)
  Problem: Education: Goal: Knowledge of General Education information will improve Description: Including pain rating scale, medication(s)/side effects and non-pharmacologic comfort measures Outcome: Progressing   Problem: Pain Managment: Goal: General experience of comfort will improve Outcome: Progressing   

## 2019-12-06 NOTE — TOC Initial Note (Signed)
Transition of Care [facility]) - Initial/Assessment Note    Patient Details  Name: Lawrence [name] MRN: [id number] Date of Birth: 1956/05/10  Transition of Care [facility]) CM/SW Contact:    Curlene Labrum, RN Phone Number: 12/06/2019, 10:06 AM  Clinical Narrative:                 64 year old with history of diastolic CHF, COPD, CAD, DM 2, PTSD/anxiety, BPH, HTN, chronic venous insufficiency recently diagnosed with right lower extremity cellulitis.  Received 7 days of Keflex but failed outpatient treatment therefore comes to the hospital.Patient was started on clindamycin.  Case Management met with the patient and wife at the bedside.  Currently receiving [place] services through [facility] for PT/OT, and RN.  Patient currently receiving IV Clindamycin for Right lower leg cellulitis.  [facility] and [facility]'s [facility].  Will continue to follow for TOC needs.  Expected Discharge Plan: [place] Barriers to Discharge: Continued Medical Work up   Patient Goals and CMS Choice Patient states their goals for this hospitalization and ongoing recovery are:: I'm receiving antibiotics for my leg infection and hoping to return home. CMS Medicare.gov Compare Post Acute Care list provided to:: Patient    Expected Discharge Plan and Services Expected Discharge Plan: [place]   Discharge Planning Services: CM Consult Post Acute Care Choice: Durable Medical Equipment(currently has hospital bed, BIPAP machine, RW, and scooter.) Living arrangements for the past 2 months: Apartment                   DME Agency: (prior to admission - pt receiving [facility], PT, OT)         [place]: [facility] ([facility])(currently with [facility] - Therapist, sports, PT,OT)        Prior Living Arrangements/Services Living arrangements for the past 2 months: [place] with:: Spouse Patient language and need for interpreter  reviewed:: Yes Do you feel safe going back to the place where you live?: Yes      Need for Family Participation in Patient Care: Yes (Comment) Care giver support system in place?: Yes (comment) Current home services: DME Criminal Activity/Legal Involvement Pertinent to Current Situation/Hospitalization: No - Comment as needed  Activities of Daily Living      Permission Sought/Granted Permission sought to share information with : Case Manager Permission granted to share information with : Yes, Verbal Permission Granted     Permission granted to share info w AGENCY: [facility] aware of admission on 12/04/2019  Permission granted to share info w Relationship: wife     Emotional Assessment Appearance:: Appears stated age Attitude/Demeanor/Rapport: Gracious Affect (typically observed): Accepting Orientation: : Oriented to Self, Oriented to Place, Oriented to  Time, Oriented to Situation Alcohol / Substance Use: Not Applicable Psych Involvement: No (comment)  Admission diagnosis:  Cellulitis of right leg [L03.115] Patient Active Problem List   Diagnosis Date Noted  . Cellulitis of right leg 12/04/2019  . Pain due to onychomycosis of toenails of both feet 09/11/2019  . Diabetic neuropathy ([place]) 09/11/2019  . Type 2 diabetes mellitus with vascular disease ([place]) 09/11/2019  . Sinus tachycardia 03/07/2019  . Chronic venous stasis 03/07/2019  . QT prolongation 03/07/2019  . History of anemia 12/26/2018  . Anemia 08/24/2018  . History of colon polyps 08/24/2018  . Do not resuscitate status 08/24/2018  . Chronic diastolic CHF (congestive heart failure) ([place]) 08/18/2018  .  Leukocytosis 08/18/2018  . Bilateral lower leg cellulitis   . Coronary artery calcification seen on CAT scan   . Pure hypercholesterolemia   . Chest pain   . Shortness of breath   . Acute respiratory failure with hypoxia ([place]) 01/13/2018  . Right foot pain 01/13/2018  . Acute on chronic diastolic CHF  (congestive heart failure) ([place]) 01/13/2018  . Diabetes mellitus type 2 in obese ([place]) 01/13/2018  . New onset type 2 diabetes mellitus ([place]) 11/08/2017  . Cellulitis 11/07/2017  . Primary osteoarthritis, left shoulder 03/05/2017  . Essential hypertension, benign 02/22/2017  . BPH without obstruction/lower urinary tract symptoms 02/22/2017  . Hematuria 02/22/2017  . Peripheral neuropathy 02/22/2017  . Physical deconditioning 02/22/2017  . Morbid obesity ([place])   . PTSD (post-traumatic stress disorder)   . Anasarca 01/31/2017  . Bilateral lower extremity edema 01/31/2017  . Cellulitis of lower extremity   . OSA (obstructive sleep apnea)    PCP:  [facility]   [facility] - [place], [place] - [address] [place] [address] Phone: [phone] Fax: [phone]     Social Determinants of Health ([place]) Interventions    Readmission Risk Interventions Readmission Risk Prevention Plan 12/06/2019  Transportation Screening Complete  PCP or Specialist Appt within 5-7 Days Complete  Home Care Screening Complete  Medication Review (RN CM) Complete  Some recent data might be hidden

## 2019-12-07 LAB — GLUCOSE, CAPILLARY
Glucose-Capillary: 167 mg/dL — ABNORMAL HIGH (ref 70–99)
Glucose-Capillary: 212 mg/dL — ABNORMAL HIGH (ref 70–99)

## 2019-12-07 LAB — CBC
HCT: 38.9 % — ABNORMAL LOW (ref 39.0–52.0)
Hemoglobin: 12.6 g/dL — ABNORMAL LOW (ref 13.0–17.0)
MCH: 29.2 pg (ref 26.0–34.0)
MCHC: 32.4 g/dL (ref 30.0–36.0)
MCV: 90.3 fL (ref 80.0–100.0)
Platelets: 253 10*3/uL (ref 150–400)
RBC: 4.31 MIL/uL (ref 4.22–5.81)
RDW: 14.5 % (ref 11.5–15.5)
WBC: 10.3 10*3/uL (ref 4.0–10.5)
nRBC: 0 % (ref 0.0–0.2)

## 2019-12-07 LAB — BASIC METABOLIC PANEL
Anion gap: 10 (ref 5–15)
BUN: 14 mg/dL (ref 8–23)
CO2: 28 mmol/L (ref 22–32)
Calcium: 8.7 mg/dL — ABNORMAL LOW (ref 8.9–10.3)
Chloride: 98 mmol/L (ref 98–111)
Creatinine, Ser: 0.88 mg/dL (ref 0.61–1.24)
GFR calc Af Amer: >60 mL/min (ref >60)
GFR calc non Af Amer: >60 mL/min (ref >60)
Glucose, Bld: 177 mg/dL — ABNORMAL HIGH (ref 70–99)
Potassium: 3.9 mmol/L (ref 3.5–5.1)
Sodium: 136 mmol/L (ref 135–145)

## 2019-12-07 LAB — MAGNESIUM: Magnesium: 2.1 mg/dL (ref 1.7–2.4)

## 2019-12-07 MED ORDER — CLINDAMYCIN HCL 150 MG PO CAPS: 150.0000 mg | ORAL_CAPSULE | Freq: Three times a day (TID) | ORAL | 0 refills | Status: DC

## 2019-12-07 MED ORDER — FLORASTOR 250 MG PO CAPS
250.0000 mg | ORAL_CAPSULE | Freq: Two times a day (BID) | ORAL | 0 refills | Status: DC
Start: 2019-12-10 — End: 2019-12-08

## 2019-12-07 NOTE — Discharge Summary (Signed)
Physician Discharge Summary  Aeon Kessner HTD:[id number] DOB: 1956-07-22 DOA: 12/04/2019  PCP: [facility]  Admit date: 12/04/2019 Discharge date: 12/07/2019  Admitted From: Home Disposition: Home  Recommendations for Outpatient Follow-up:  1. Follow up with PCP in 1-2 weeks 2. Please obtain BMP/CBC in one week your next doctors visit.  3. Oral clindamycin for 5 more days.  Following completion of this, he will take probiotics as prescribed  Discharge Condition: Stable CODE STATUS: DNR Diet recommendation: Diabetic  Brief/Interim Summary: 64 year old with history of diastolic CHF, COPD, CAD, DM 2, PTSD/anxiety, BPH, HTN, chronic venous insufficiency recently diagnosed with right lower extremity cellulitis.  Received 7 days of Keflex but failed outpatient treatment therefore comes to the hospital.Patient was started on clindamycin.  Patient did well on 48 hours of IV clindamycin, his cellulitis improved therefore transition to p.o. regimen.  Advised to take probiotics towards the end of his course.   Assessment & Plan:   Right lower extremity nonpurulent cellulitis-greatly improved Patient failed outpatient treatment with 7 days of Keflex Transition to p.o. clindamycin today for 5 more days. After completion of antibiotic, will start probiotics-Florastor prescribed Right lower extremity Dopplers = negative prelim read Wound care, Unna boots  Acute on chronic congestive heart failure with preserved ejection fraction, 55%, class II Appears euvolemic.  Transition back to oral home Lasix Fluid restriction, monitor and replete electrolytes as needed Echocardiogram 7/20-EF 55 to 72% with diastolic dysfunction  Chronic COPD As needed bronchodilators  History of CAD:  LHC in 2019 with nonobstructive CAD. No anginal symptoms. -Continue home medications.  Diabetes mellitus type 2, uncontrolled secondary to hyperglycemia Insulin sliding scale Accu-Chek A1c 8.3 Home  p.o. meds on hold, increase Lantus to 14 units daily  Essential hypertension -Continue home meds  Obstructive sleep apnea -CPAP nightly  History of PTSD/anxiety -Continue home meds  BPH On prazosin  Morbid obesity with BMI greater than 54 Diet control and exercise   Discharge Diagnoses:  Active Problems:   Diabetes mellitus type 2 in obese (HCC)   Chronic diastolic CHF (congestive heart failure) (HCC)   Chronic venous stasis   Diabetic neuropathy (HCC)   Cellulitis of right leg    Consultations:  None  Subjective: Bilateral lower extremity erythema and pain has significantly improved.  Discharge Exam: Vitals:   12/06/19 2124 12/07/19 0452  BP:  130/70  Pulse: 79 73  Resp: 18 18  Temp:  (!) 97.5 F (36.4 C)  SpO2: 93% 91%   Vitals:   12/06/19 1649 12/06/19 2000 12/06/19 2124 12/07/19 0452  BP: 123/65 120/72  130/70  Pulse: 71 81 79 73  Resp: '18 18 18 18  '$ Temp: 98.5 F (36.9 C) 98.3 F (36.8 C)  (!) 97.5 F (36.4 C)  TempSrc: Oral Oral  Oral  SpO2: 92% 92% 93% 91%  Weight:      Height:        General: Pt is alert, awake, not in acute distress Cardiovascular: RRR, S1/S2 +, no rubs, no gallops Respiratory: CTA bilaterally, no wheezing, no rhonchi Abdominal: Soft, NT, ND, bowel sounds + Extremities: no edema, no cyanosis Bilateral lower extremity dressing in place-erythema much improved.  Less tender to touch.  Discharge Instructions  Discharge Instructions    Face-to-face encounter (required for Medicare/Medicaid patients)   Complete by: As directed    I Julen Rubert Chirag Jennet Scroggin certify that this patient is under my care and that I, or a nurse practitioner or physician's assistant working with me, had a face-to-face encounter  that meets the physician face-to-face encounter requirements with this patient on 12/07/2019. The encounter with the patient was in whole, or in part for the following medical condition(s) which is the primary reason for home  health care lower extremity weakness secondary to lower extremity cellulitis, pain   The encounter with the patient was in whole, or in part, for the following medical condition, which is the primary reason for home health care: Lower extremity weakness secondary to cellulitis   I certify that, based on my findings, the following services are medically necessary home health services: Physical therapy   Reason for Medically Necessary Home Health Services: Therapy- Instruction on Safe use of Assistive Devices for ADLs   My clinical findings support the need for the above services: Unsafe ambulation due to balance issues   Further, I certify that my clinical findings support that this patient is homebound due to: Ambulates short distances less than 300 feet   Home Health   Complete by: As directed    To provide the following care/treatments: PT     Allergies as of 12/07/2019      Reactions   Vancomycin Other (See Comments)   "Red Man Syndrome"   Tubersol [tuberculin] Other (See Comments)   Reaction unknown   Doxycycline Rash      Medication List    TAKE these medications   acetaminophen 650 MG CR tablet Commonly known as: TYLENOL Take 1,300 mg by mouth every 8 (eight) hours as needed for pain.   allopurinol 100 MG tablet Commonly known as: ZYLOPRIM Take 1 tablet (100 mg total) by mouth daily. What changed: how much to take   ARIPiprazole 5 MG tablet Commonly known as: ABILIFY Take 5 mg by mouth at bedtime.   ASPERCREME EX Apply 1 application topically 2 (two) times daily as needed (pain and inflammation).   aspirin 81 MG EC tablet Take 1 tablet (81 mg total) by mouth daily.   atorvastatin 40 MG tablet Commonly known as: LIPITOR Take 1 tablet (40 mg total) by mouth daily at 6 PM.   blood glucose meter kit and supplies Dispense based on patient and insurance preference. Use up to four times daily as directed. (FOR ICD-10 E10.9, E11.9).   cephALEXin 500 MG capsule Commonly  known as: KEFLEX Take 1 capsule (500 mg total) by mouth 3 (three) times daily.   cetirizine 10 MG tablet Commonly known as: ZYRTEC Take 10 mg by mouth at bedtime.   clindamycin 150 MG capsule Commonly known as: CLEOCIN Take 1 capsule (150 mg total) by mouth 3 (three) times daily for 5 days.   colchicine 0.6 MG tablet Take 0.6 mg by mouth as needed (gout).   ferrous sulfate 324 (65 Fe) MG Tbec Take 324 mg by mouth daily with breakfast.   Florastor 250 MG capsule Generic drug: saccharomyces boulardii Take 1 capsule (250 mg total) by mouth 2 (two) times daily for 14 days. Start taking on: Dec 10, 2019   fluticasone 50 MCG/ACT nasal spray Commonly known as: FLONASE Place 2 sprays into both nostrils daily.   furosemide 80 MG tablet Commonly known as: LASIX Take 1 tablet (80 mg total) by mouth 2 (two) times daily. What changed:   how much to take  additional instructions   glipiZIDE 5 MG tablet Commonly known as: GLUCOTROL Take 1 tablet (5 mg total) by mouth 2 (two) times daily.   lisinopril 10 MG tablet Commonly known as: ZESTRIL Take 1 tablet (10 mg total) by mouth daily.  loperamide 2 MG capsule Commonly known as: IMODIUM Take 2 mg by mouth 2 (two) times a day.   meloxicam 7.5 MG tablet Commonly known as: MOBIC Take 7.5 mg by mouth daily as needed for pain.   metFORMIN 1000 MG tablet Commonly known as: GLUCOPHAGE Take 1,000 mg by mouth 2 (two) times daily with a meal.   metoprolol succinate 25 MG 24 hr tablet Commonly known as: TOPROL-XL Take 3 tablets (75 mg total) by mouth 2 (two) times daily. Take with or immediately following a meal.   prazosin 2 MG capsule Commonly known as: MINIPRESS Take 4 mg by mouth at bedtime.   pregabalin 100 MG capsule Commonly known as: LYRICA Take 100 mg by mouth in the morning, at noon, and at bedtime.   propranolol 10 MG tablet Commonly known as: INDERAL Take 1 tablet (10 mg total) by mouth as needed. What changed:  reasons to take this   silver sulfADIAZINE 1 % cream Commonly known as: SILVADENE Apply 1 application topically See admin instructions. Apply as directed once a day as directed to bilateral legs   temazepam 15 MG capsule Commonly known as: RESTORIL Take 15 mg by mouth at bedtime.   traZODone 100 MG tablet Commonly known as: DESYREL Take 200 mg by mouth at bedtime.   venlafaxine XR 150 MG 24 hr capsule Commonly known as: EFFEXOR-XR Take 150 mg by mouth daily with breakfast.   Vitamin D (Ergocalciferol) 1.25 MG (50000 UNIT) Caps capsule Commonly known as: DRISDOL Take 50,000 Units by mouth every 7 (seven) days.      Follow-up Cole. Schedule an appointment as soon as possible for a visit in 1 week(s).   Specialty: General Practice Contact information: [place] [phone]        Sueanne Margarita, MD .   Specialty: Cardiology Contact information: [id number] [place] [address] [phone]        Constance Haw, MD .   Specialty: Cardiology Contact information: [address]  [place] [address] [phone]          Allergies  Allergen Reactions  . Vancomycin Other (See Comments)    "Red Man Syndrome"  . Tubersol [Tuberculin] Other (See Comments)    Reaction unknown  . Doxycycline Rash    You were cared for by a hospitalist during your hospital stay. If you have any questions about your discharge medications or the care you received while you were in the hospital after you are discharged, you can call the unit and asked to speak with the hospitalist on call if the hospitalist that took care of you is not available. Once you are discharged, your primary care physician will handle any further medical issues. Please note that no refills for any discharge medications will be authorized once you are discharged, as it is imperative that you return to your primary  care physician (or establish a relationship with a primary care physician if you do not have one) for your aftercare needs so that they can reassess your need for medications and monitor your lab values.   Procedures/Studies: DG Chest 2 View  Result Date: 11/20/2019 CLINICAL DATA:  Shortness of breath. Additional history provided: Leg swelling for approximately 2 weeks, shortness of breath and chest pain for approximately 1 week, history of CHF, ex smoker, diabetes, hypertension, asthma, COPD. EXAM: CHEST - 2 VIEW COMPARISON:  CTA chest 09/08/2018, chest radiograph  09/09/2019 FINDINGS: Unchanged cardiomegaly. Mild bibasilar atelectasis. No evidence of airspace consolidation. No evidence of pleural effusion or pneumothorax. No acute bony abnormality. Thoracic spondylosis. IMPRESSION: Unchanged cardiomegaly. Minimal bibasilar atelectasis. No evidence of airspace consolidation. Electronically Signed   By: Kellie Simmering DO   On: 11/20/2019 14:40   VAS [place] LOWER EXTREMITY VENOUS (DVT)  Result Date: 12/05/2019  Lower Venous DVTStudy Indications: Swelling, Erythema, and cellulitis.  Limitations: Body habitus and poor ultrasound/tissue interface. Comparison Study: 01/15/18 negative Performing Technologist: June Leap RDMS, RVT  Examination Guidelines: A complete evaluation includes B-mode imaging, spectral Doppler, color Doppler, and power Doppler as needed of all accessible portions of each vessel. Bilateral testing is considered an integral part of a complete examination. Limited examinations for reoccurring indications may be performed as noted. The reflux portion of the exam is performed with the patient in reverse Trendelenburg.  +---------+---------------+---------+-----------+----------+--------------+ RIGHT    CompressibilityPhasicitySpontaneityPropertiesThrombus Aging +---------+---------------+---------+-----------+----------+--------------+ CFV      Full           Yes      Yes                                  +---------+---------------+---------+-----------+----------+--------------+ SFJ      Full                                                        +---------+---------------+---------+-----------+----------+--------------+ FV Prox  Full                                                        +---------+---------------+---------+-----------+----------+--------------+ FV Mid   Full                                                        +---------+---------------+---------+-----------+----------+--------------+ FV DistalFull                                                        +---------+---------------+---------+-----------+----------+--------------+ PFV      Full                                                        +---------+---------------+---------+-----------+----------+--------------+ POP      Full           Yes      Yes                                 +---------+---------------+---------+-----------+----------+--------------+ PTV  Not visualized +---------+---------------+---------+-----------+----------+--------------+ PERO                                                  Not visualized +---------+---------------+---------+-----------+----------+--------------+   +----+---------------+---------+-----------+----------+--------------+ LEFTCompressibilityPhasicitySpontaneityProperties               +----+---------------+---------+-----------+----------+--------------+ CFV                                              Not visualized +----+---------------+---------+-----------+----------+--------------+     Summary: RIGHT: - There is no evidence of deep vein thrombosis in the lower extremity. However, portions of this examination were limited- see technologist comments above.  - No cystic structure found in the popliteal fossa.   *See table(s) above for measurements and  observations. Electronically signed by Curt Jews MD on 12/05/2019 at 4:30:58 PM.    Final       The results of significant diagnostics from this hospitalization (including imaging, microbiology, ancillary and laboratory) are listed below for reference.     Microbiology: Recent Results (from the past 240 hour(s))  Culture, blood (routine x 2)     Status: None (Preliminary result)   Collection Time: 12/04/19  5:18 PM   Specimen: BLOOD RIGHT HAND  Result Value Ref Range Status   Specimen Description BLOOD RIGHT HAND  Final   Special Requests   Final    BOTTLES DRAWN AEROBIC AND ANAEROBIC Blood Culture results may not be optimal due to an inadequate volume of blood received in culture bottles   Culture   Final    NO GROWTH 3 DAYS Performed at [facility], [place] [address], [place], [place] [address]    Report Status PENDING  Incomplete  Culture, blood (routine x 2)     Status: None (Preliminary result)   Collection Time: 12/04/19  5:23 PM   Specimen: BLOOD RIGHT HAND  Result Value Ref Range Status   Specimen Description BLOOD RIGHT HAND  Final   Special Requests   Final    BOTTLES DRAWN AEROBIC AND ANAEROBIC Blood Culture adequate volume   Culture   Final    NO GROWTH 3 DAYS Performed at [facility], [place] [address], [place], [place] [address]    Report Status PENDING  Incomplete  Respiratory Panel by RT PCR (Flu A&B, Covid) - Nasopharyngeal Swab     Status: None   Collection Time: 12/04/19  5:49 PM   Specimen: Nasopharyngeal Swab  Result Value Ref Range Status   SARS Coronavirus 2 by RT PCR NEGATIVE NEGATIVE Final    Comment: (NOTE) SARS-CoV-2 target nucleic acids are NOT DETECTED. The SARS-CoV-2 RNA is generally detectable in upper respiratoy specimens during the acute phase of infection. The lowest concentration of SARS-CoV-2 viral copies this assay can detect is 131 copies/mL. A negative result does not preclude SARS-Cov-2 infection and should not be used as  the sole basis for treatment or other patient management decisions. A negative result may occur with  improper specimen collection/handling, submission of specimen other than nasopharyngeal swab, presence of viral mutation(s) within the areas targeted by this assay, and inadequate number of viral copies (<131 copies/mL). A negative result must be combined with clinical observations, patient history, and epidemiological information. The expected result  is Negative. Fact Sheet for Patients:  PinkCheek.be Fact Sheet for Healthcare Providers:  GravelBags.it This test is not yet ap proved or cleared by the [place] FDA and  has been authorized for detection and/or diagnosis of SARS-CoV-2 by FDA under an Emergency Use Authorization (EUA). This EUA will remain  in effect (meaning this test can be used) for the duration of the COVID-19 declaration under Section 564(b)(1) of the Act, 21 U.S.C. section 360bbb-3(b)(1), unless the authorization is terminated or revoked sooner.    Influenza A by PCR NEGATIVE NEGATIVE Final   Influenza B by PCR NEGATIVE NEGATIVE Final    Comment: (NOTE) The Xpert Xpress SARS-CoV-2/FLU/RSV assay is intended as an aid in  the diagnosis of influenza from Nasopharyngeal swab specimens and  should not be used as a sole basis for treatment. Nasal washings and  aspirates are unacceptable for Xpert Xpress SARS-CoV-2/FLU/RSV  testing. Fact Sheet for Patients: PinkCheek.be Fact Sheet for Healthcare Providers: GravelBags.it This test is not yet approved or cleared by the [place] FDA and  has been authorized for detection and/or diagnosis of SARS-CoV-2 by  FDA under an Emergency Use Authorization (EUA). This EUA will remain  in effect (meaning this test can be used) for the duration of the  Covid-19 declaration under Section 564(b)(1) of the Act, 21   U.S.C. section 360bbb-3(b)(1), unless the authorization is  terminated or revoked. Performed at [facility], [place] [address], [place], [place] [address]      Labs: BNP (last 3 results) Recent Labs    02/19/19 1753 12/04/19 1744  BNP 38.6 03.8   Basic Metabolic Panel: Recent Labs  Lab 12/04/19 0850 12/05/19 0602 12/06/19 0306 12/07/19 0339  NA 134* 134* 136 136  K 4.5 4.4 3.9 3.9  CL 94* 96* 98 98  CO2 '29 25 29 28  '$ GLUCOSE 209* 209* 205* 177*  BUN '23 17 15 14  '$ CREATININE 0.95 0.88 0.83 0.88  CALCIUM 9.2 8.9 8.6* 8.7*  MG  --  2.1 2.0 2.1   Liver Function Tests: Recent Labs  Lab 12/04/19 0850  AST 15  ALT 21  ALKPHOS 70  BILITOT 0.6  PROT 6.4*  ALBUMIN 3.5   No results for input(s): LIPASE, AMYLASE in the last 168 hours. No results for input(s): AMMONIA in the last 168 hours. CBC: Recent Labs  Lab 12/04/19 0850 12/05/19 0602 12/06/19 0306 12/07/19 0339  WBC 12.9* 11.1* 9.6 10.3  NEUTROABS 10.0*  --   --   --   HGB 12.9* 13.1 12.5* 12.6*  HCT 41.6 40.3 38.7* 38.9*  MCV 92.9 89.8 90.6 90.3  PLT 264 249 239 253   Cardiac Enzymes: No results for input(s): CKTOTAL, CKMB, CKMBINDEX, TROPONINI in the last 168 hours. BNP: Invalid input(s): POCBNP CBG: Recent Labs  Lab 12/06/19 0805 12/06/19 1149 12/06/19 1648 12/06/19 2049 12/07/19 0725  GLUCAP 234* 213* 211* 209* 167*   D-Dimer Recent Labs    12/04/19 1823  DDIMER 0.96*   Hgb A1c Recent Labs    12/05/19 0602  HGBA1C 8.3*   Lipid Profile No results for input(s): CHOL, HDL, LDLCALC, TRIG, CHOLHDL, LDLDIRECT in the last 72 hours. Thyroid function studies No results for input(s): TSH, T4TOTAL, T3FREE, THYROIDAB in the last 72 hours.  Invalid input(s): FREET3 Anemia work up No results for input(s): VITAMINB12, FOLATE, FERRITIN, TIBC, IRON, RETICCTPCT in the last 72 hours. Urinalysis    Component Value Date/Time   COLORURINE YELLOW 12/04/2019 [place]  12/04/2019 1839   LABSPEC 1.014 12/04/2019 1839   PHURINE 6.0 12/04/2019 1839   GLUCOSEU NEGATIVE 12/04/2019 1839   HGBUR NEGATIVE 12/04/2019 1839   BILIRUBINUR NEGATIVE 12/04/2019 1839   KETONESUR NEGATIVE 12/04/2019 1839   PROTEINUR NEGATIVE 12/04/2019 1839   NITRITE NEGATIVE 12/04/2019 1839   LEUKOCYTESUR NEGATIVE 12/04/2019 1839   Sepsis Labs Invalid input(s): PROCALCITONIN,  WBC,  LACTICIDVEN Microbiology Recent Results (from the past 240 hour(s))  Culture, blood (routine x 2)     Status: None (Preliminary result)   Collection Time: 12/04/19  5:18 PM   Specimen: BLOOD RIGHT HAND  Result Value Ref Range Status   Specimen Description BLOOD RIGHT HAND  Final   Special Requests   Final    BOTTLES DRAWN AEROBIC AND ANAEROBIC Blood Culture results may not be optimal due to an inadequate volume of blood received in culture bottles   Culture   Final    NO GROWTH 3 DAYS Performed at [facility], [place] [address] [place], [place] [address]    Report Status PENDING  Incomplete  Culture, blood (routine x 2)     Status: None (Preliminary result)   Collection Time: 12/04/19  5:23 PM   Specimen: BLOOD RIGHT HAND  Result Value Ref Range Status   Specimen Description BLOOD RIGHT HAND  Final   Special Requests   Final    BOTTLES DRAWN AEROBIC AND ANAEROBIC Blood Culture adequate volume   Culture   Final    NO GROWTH 3 DAYS Performed at [facility], [place] [address], [place], [place] [address]    Report Status PENDING  Incomplete  Respiratory Panel by RT PCR (Flu A&B, Covid) - Nasopharyngeal Swab     Status: None   Collection Time: 12/04/19  5:49 PM   Specimen: Nasopharyngeal Swab  Result Value Ref Range Status   SARS Coronavirus 2 by RT PCR NEGATIVE NEGATIVE Final    Comment: (NOTE) SARS-CoV-2 target nucleic acids are NOT DETECTED. The SARS-CoV-2 RNA is generally detectable in upper respiratoy specimens during the acute phase of infection. The lowest concentration of  SARS-CoV-2 viral copies this assay can detect is 131 copies/mL. A negative result does not preclude SARS-Cov-2 infection and should not be used as the sole basis for treatment or other patient management decisions. A negative result may occur with  improper specimen collection/handling, submission of specimen other than nasopharyngeal swab, presence of viral mutation(s) within the areas targeted by this assay, and inadequate number of viral copies (<131 copies/mL). A negative result must be combined with clinical observations, patient history, and epidemiological information. The expected result is Negative. Fact Sheet for Patients:  PinkCheek.be Fact Sheet for Healthcare Providers:  GravelBags.it This test is not yet ap proved or cleared by the [place] FDA and  has been authorized for detection and/or diagnosis of SARS-CoV-2 by FDA under an Emergency Use Authorization (EUA). This EUA will remain  in effect (meaning this test can be used) for the duration of the COVID-19 declaration under Section 564(b)(1) of the Act, 21 U.S.C. section 360bbb-3(b)(1), unless the authorization is terminated or revoked sooner.    Influenza A by PCR NEGATIVE NEGATIVE Final   Influenza B by PCR NEGATIVE NEGATIVE Final    Comment: (NOTE) The Xpert Xpress SARS-CoV-2/FLU/RSV assay is intended as an aid in  the diagnosis of influenza from Nasopharyngeal swab specimens and  should not be used as a sole basis for treatment. Nasal washings and  aspirates are unacceptable for Xpert  Xpress SARS-CoV-2/FLU/RSV  testing. Fact Sheet for Patients: PinkCheek.be Fact Sheet for Healthcare Providers: GravelBags.it This test is not yet approved or cleared by the [place] FDA and  has been authorized for detection and/or diagnosis of SARS-CoV-2 by  FDA under an Emergency Use Authorization (EUA).  This EUA will remain  in effect (meaning this test can be used) for the duration of the  Covid-19 declaration under Section 564(b)(1) of the Act, 21  U.S.C. section 360bbb-3(b)(1), unless the authorization is  terminated or revoked. Performed at [facility], [place] [address], [place], [place] [address]      Time coordinating discharge:  I have spent 35 minutes face to face with the patient and on the ward discussing the patients care, assessment, plan and disposition with other care givers. >50% of the time was devoted counseling the patient about the risks and benefits of treatment/Discharge disposition and coordinating care.   SIGNED:   Damita Lack, MD  [facility] 12/07/2019, 9:43 AM   If 7PM-7AM, please contact night-coverage

## 2019-12-07 NOTE — Progress Notes (Signed)
Patient discharging home. Discharge instructions explained to patient and he verbalized understanding. Dressing to [facility] changed. Packed all personal belongings. No further questions or concerns voiced. Awaiting transportation.

## 2019-12-07 NOTE — TOC Transition Note (Signed)
Transition of Care [facility]) - CM/SW Discharge Note   Patient Details  Name: Lawrence [name] MRN: [id number] Date of Birth: March 09, 1956  Transition of Care [facility]) CM/SW Contact:  Claudie Leach, RN 12/07/2019, 11:54 AM   Clinical Narrative:    Patient to d/c home with resumption of RN and PT [facility] services through [facility].    PTAR arranged for patient.    Final next level of care: [place] Barriers to Discharge: Continued Medical Work up   Patient Goals and CMS Choice Patient states their goals for this hospitalization and ongoing recovery are:: I'm receiving antibiotics for my leg infection and hoping to return home. CMS Medicare.gov Compare Post Acute Care list provided to:: Patient     Discharge Plan and Services   Discharge Planning Services: CM Consult Post Acute Care Choice: Durable Medical Equipment(currently has hospital bed, BIPAP machine, RW, and scooter.)            DME Agency: (prior to admission - pt receiving [facility], PT, [place])         [place]: [facility] (formerly [facility])(currently with [facility] - Therapist, sports, PT,OT) Date [place]: 12/07/19 Time [place]: 1054 Representative spoke with at [place]: [place]ann[place] ([place]) Interventions     Readmission Risk Interventions Readmission Risk Prevention Plan 12/06/2019  Transportation Screening Complete  PCP or Specialist Appt within 5-7 Days Complete  Home Care Screening Complete  Medication Review (RN CM) Complete  Some recent data might be hidden

## 2019-12-08 ENCOUNTER — Other Ambulatory Visit: Payer: Self-pay | Admitting: Internal Medicine

## 2019-12-08 MED ORDER — CLINDAMYCIN HCL 150 MG PO CAPS: 150.0000 mg | ORAL_CAPSULE | Freq: Three times a day (TID) | ORAL | 0 refills | Status: AC

## 2019-12-08 MED ORDER — FLORASTOR 250 MG PO CAPS
250.0000 mg | ORAL_CAPSULE | Freq: Two times a day (BID) | ORAL | 0 refills | Status: AC
Start: 2019-12-10 — End: 2019-12-24

## 2019-12-08 NOTE — Progress Notes (Signed)
Patient called this morning stating he by mistake give [place] the wrong pharmacy to send his prescriptions to yesterday.  This morning he is requesting his medications to be sent to [facility] on [address].  I have confirmed the pharmacy.  Both prescriptions  are now sent to correct pharmacy-clindamycin and Florastor.

## 2019-12-09 LAB — CULTURE, BLOOD (ROUTINE X 2)
Culture: NO GROWTH
Culture: NO GROWTH
Special Requests: ADEQUATE

## 2019-12-11 ENCOUNTER — Other Ambulatory Visit: Payer: Self-pay

## 2019-12-11 ENCOUNTER — Ambulatory Visit (INDEPENDENT_AMBULATORY_CARE_PROVIDER_SITE_OTHER): Payer: Medicare Other | Admitting: Podiatry

## 2019-12-11 ENCOUNTER — Encounter: Payer: Self-pay | Admitting: Podiatry

## 2019-12-11 VITALS — Temp 96.4°F

## 2019-12-11 DIAGNOSIS — E1142 Type 2 diabetes mellitus with diabetic polyneuropathy: Secondary | ICD-10-CM | POA: Diagnosis not present

## 2019-12-11 DIAGNOSIS — B351 Tinea unguium: Secondary | ICD-10-CM | POA: Diagnosis not present

## 2019-12-11 DIAGNOSIS — E1159 Type 2 diabetes mellitus with other circulatory complications: Secondary | ICD-10-CM

## 2019-12-11 DIAGNOSIS — M79675 Pain in left toe(s): Secondary | ICD-10-CM

## 2019-12-11 DIAGNOSIS — M79674 Pain in right toe(s): Secondary | ICD-10-CM

## 2019-12-11 DIAGNOSIS — I878 Other specified disorders of veins: Secondary | ICD-10-CM

## 2019-12-11 NOTE — Progress Notes (Signed)
This patient returns to my office for at risk foot care.  This patient requires this care by a professional since this patient will be at risk due to having diabetic neuropathy and angiopathy and venous stasis.  Patient has been just treated for cellulitis right leg. This patient presents to the office with his wife.This patient is unable to cut nails himself since the patient cannot reach his nails.These nails are painful walking and wearing shoes.  This patient presents for at risk foot care today.  General Appearance  Alert, conversant and in no acute stress.  Vascular  Dorsalis pedis and posterior tibial  pulses are not  palpable  billaterally due to severe swelling feet.  Capillary return is within normal limits  bilaterally. Temperature is within normal limits  Bilaterally. Venous stasis right leg.  Neurologic  Senn-Weinstein monofilament wire test absent right foot.  Diminished left foot.. Muscle power within normal limits bilaterally.  Nails Thick disfigured discolored nails with subungual debris  from hallux to fifth toes bilaterally.  Pincer hallux nails   B/L. No evidence of bacterial infection or drainage bilaterally.  Orthopedic  No limitations of motion  feet .  No crepitus or effusions noted.  No bony pathology or digital deformities noted.  Severe HAV  B/L.  Skin  normotropic skin with no porokeratosis noted bilaterally.  No signs of infections or ulcers noted.     Onychomycosis  Pain in right toes  Pain in left toes  Consent was obtained for treatment procedures.   Mechanical debridement of nails 1-5  bilaterally performed with a nail nipper.  Filed with dremel without incident.    Return office visit   3 months                   Told patient to return for periodic foot care and evaluation due to potential at risk complications.   Amad Mau DPM  

## 2019-12-19 ENCOUNTER — Other Ambulatory Visit: Payer: Self-pay | Admitting: Cardiology

## 2019-12-23 ENCOUNTER — Other Ambulatory Visit: Payer: Self-pay | Admitting: Podiatry

## 2019-12-23 ENCOUNTER — Encounter: Payer: Self-pay | Admitting: Podiatry

## 2019-12-23 DIAGNOSIS — M792 Neuralgia and neuritis, unspecified: Secondary | ICD-10-CM

## 2019-12-23 NOTE — Telephone Encounter (Signed)
Pt called yelling that his pain was a 6. 3:01pm - Male caller states she doesn't know why no one will refill the Lyrica, he saw Dr. Prudence Davidson and they had an understanding. 5:00pm - I called pt and informed that his doctor Dr. Lenn Sink would not refill the Lyrica due to his not being seen in over a year, and that with the severity of his pain he should go to the ED because he may have circulation problems and if he did not want to go to the ED, I would have a scheduler call him tomorrow to get an appt. Pt states he is fighting a case of cellulitis. I told him then he should go tot he ED, especially if he was fighting cellulitis and having that much pain, especially if he was running a fever. Pt states he is not running a fever. I told pt I was recommending he go to the ED, but would have scheduler call to make sure he was taken care of.

## 2019-12-23 NOTE — Telephone Encounter (Signed)
[facility] - Wells Guiles states pt called stating he needed a refill of the Lyrica, because Dr. Lenn Sink would not refill because pt had not been seen in so many years and Dr. Cheryle Horsfall did not feel comfortable writing for the Lyrica.

## 2019-12-23 NOTE — Telephone Encounter (Signed)
[facility] - Wells Guiles states pt's doctor states he has not been seen in over 1 year and will not refill the Lyrica and pt told them he had seen Dr. Prudence Davidson in less than a year. I told Wells Guiles, I had reviewed clinicals and medication orders for pt and Dr. Prudence Davidson had not ordered or filled Lyrica for this pt and did not monitor long term medications.

## 2019-12-24 ENCOUNTER — Telehealth: Payer: Self-pay | Admitting: Podiatry

## 2019-12-24 ENCOUNTER — Telehealth: Payer: Self-pay | Admitting: *Deleted

## 2019-12-24 NOTE — Telephone Encounter (Signed)
[facility] called concerning pt.

## 2019-12-24 NOTE — Telephone Encounter (Signed)
I called to speak with Peggye Pitt and Sharee Pimple states she had called also and I explained that our office had not ordered the Lyrica, and Dr. Prudence Davidson was not following pt for the Lyrica, and we had offered pt an appt today with another doctor, but pt refused due to transportation problems. I told Sharee Pimple I had also recommended pt go to the ED if his pain was severe, he could also be having circulation problems, or if as pt stated he was fighting cellulitis he should go to the ED.

## 2019-12-24 NOTE — Telephone Encounter (Signed)
Called patient because triage nurse stated that he needed to get In for an appointment to see a provider. Patient wanted to see if we could prescribe Lyrica long term. Patient decided that he would give [place] a call back

## 2019-12-25 ENCOUNTER — Other Ambulatory Visit: Payer: Self-pay | Admitting: *Deleted

## 2019-12-25 DIAGNOSIS — R600 Localized edema: Secondary | ICD-10-CM

## 2020-01-01 ENCOUNTER — Telehealth (HOSPITAL_COMMUNITY): Payer: Self-pay

## 2020-01-01 NOTE — Telephone Encounter (Signed)
The above patient or their representative was contacted and gave the following answers to these questions:         Do you have any of the following symptoms?    NO  Fever                    Cough                   Shortness of breath  Do  you have any of the following other symptoms?    muscle pain         vomiting,        diarrhea        rash         weakness        red eye        abdominal pain         bruising          bruising or bleeding              joint pain           severe headache    Have you been in contact with someone who was or has been sick in the past 2 weeks?  NO  Yes                 Unsure                         Unable to assess   Does the person that you were in contact with have any of the following symptoms?   Cough         shortness of breath           muscle pain         vomiting,            diarrhea            rash            weakness           fever            red eye           abdominal pain           bruising  or  bleeding                joint pain                severe headache                 COMMENTS OR ACTION PLAN FOR THIS PATIENT:        PATIENT IS FULLY VACCINATED/[facility] 

## 2020-01-02 ENCOUNTER — Ambulatory Visit (HOSPITAL_COMMUNITY)
Admission: RE | Admit: 2020-01-02 | Discharge: 2020-01-02 | Disposition: A | Discharge status: Home / Self Care | Payer: Medicare Other | Source: Ambulatory Visit | Attending: Physician Assistant | Admitting: Physician Assistant

## 2020-01-02 ENCOUNTER — Ambulatory Visit: Payer: Medicare Other | Admitting: Vascular Surgery

## 2020-01-02 ENCOUNTER — Encounter: Payer: Self-pay | Admitting: Vascular Surgery

## 2020-01-02 ENCOUNTER — Other Ambulatory Visit: Payer: Self-pay

## 2020-01-02 DIAGNOSIS — R600 Localized edema: Secondary | ICD-10-CM | POA: Insufficient documentation

## 2020-01-16 ENCOUNTER — Other Ambulatory Visit: Payer: Self-pay

## 2020-01-16 ENCOUNTER — Encounter: Payer: Self-pay | Admitting: Vascular Surgery

## 2020-01-16 ENCOUNTER — Ambulatory Visit (INDEPENDENT_AMBULATORY_CARE_PROVIDER_SITE_OTHER): Payer: Medicare Other | Admitting: Vascular Surgery

## 2020-01-16 VITALS — BP 128/67 | HR 95 | Temp 98.2°F | Resp 20 | Ht 74.0 in | Wt >= 6400 oz

## 2020-01-16 DIAGNOSIS — I872 Venous insufficiency (chronic) (peripheral): Secondary | ICD-10-CM

## 2020-01-16 NOTE — Progress Notes (Signed)
 REASON FOR CONSULT:    Chronic venous insufficiency with bilateral lower extremity edema and episodes of recurrent cellulitis.  The consult is requested by Jessica Livengood, PA.  ASSESSMENT & PLAN:   CHRONIC VENOUS INSUFFICIENCY: This patient has CEAP C6 venous disease with venous ulcers on the right leg.  He has been wearing knee-high compression stockings.  We will try to get him fitted for thigh-high compression stockings with a gradient of 20 to 30 mmHg.  If we cannot get these to fit will use knee-high stockings with a gradient of 20-30 and then use Ace bandages on the thigh.  We have also discussed the importance of intermittent leg elevation and the proper positioning for this.  I have encouraged him to avoid prolonged sitting and standing.  We discussed the importance of exercise specifically walking and water aerobics.  In addition we discussed the importance of maintaining a healthy weight is central obesity especially increases lower extremity venous pressure.  I plan on seeing him back in 3 months.  If the wounds have not made significant progress I think he would be a candidate for endovenous laser ablation of the left great saphenous vein from the distal thigh to the saphenofemoral junction.  PERIPHERAL VASCULAR DISEASE: The patient has fairly brisk but monophasic signals in both feet.  I have ordered ABIs for his next follow-up visit in 3 months.  He has multiple risk factors for peripheral vascular disease including diabetes, hypertension, hypercholesterolemia, and a remote history of tobacco use.   , MD Office: [phone]   HPI:   Lawrence [name] is a pleasant 64 y.o. male, who is referred with chronic venous insufficiency, bilateral lower extremity edema, and recurrent episodes of cellulitis in his lower extremities.  I have reviewed the records from the referring office.  The patient was seen by Jessica Livengood, PA on 11/21/2019 with bilateral lower extremity  swelling and cellulitis.  She also had evidence of peripheral vascular disease.  For this reason she was sent for vascular consultation.  On my history the patient has a long history of bilateral lower extremity swelling.  He describes aching pain heaviness and throbbing in both legs which is worse at the end of the day.  He is ambulatory with a walker.  He also has significant swelling which is relieved somewhat with elevation but not completely.  He has been wearing knee-high compression stockings.  He denies any previous history of DVT or history of phlebitis.  He said no previous venous procedures.  He has had recurrent ulcerations in both legs most recently he has had ulcers in the right leg.  He was in the hospital in the end of April with swelling and cellulitis.  His risk factors for peripheral vascular disease include type 2 diabetes, hypertension, hypercholesterolemia, and tobacco use.  He quit in 2017.  He has multiple other medical issues including COPD and chronic diastolic congestive heart failure.  He has had bilateral total knee replacements.  Past Medical History:  Diagnosis Date  . Acute on chronic diastolic CHF (congestive heart failure) (HCC) 01/13/2018  . Acute respiratory failure with hypoxia (HCC) 01/13/2018  . Adenomatous colon polyp 2007  . Anasarca 01/31/2017  . Anemia   . Anxiety   . Arthritis   . Asthma   . Bilateral lower extremity edema 01/31/2017  . Bilateral lower leg cellulitis   . BPH without obstruction/lower urinary tract symptoms 02/22/2017  . Cellulitis 11/07/2017  . Cellulitis of lower extremity   . Chest   pain   . Chronic cough   . Chronic diastolic CHF (congestive heart failure) ([place])   . Chronic venous stasis 03/07/2019  . COPD (chronic obstructive pulmonary disease) ([place])   . Coronary artery calcification seen on CAT scan   . Depression   . Diabetic neuropathy ([place]) 09/11/2019  . Hematuria 02/22/2017  . History of anemia 12/26/2018  . History of cardiac  cath    a.  01/2018 with angiographically minimal coronary disease with  up to 40% prox LAD and 20% mid LCx and OM2 with normal LVF and normal LVEDP..  . History of colon polyps 08/24/2018  . Hypertension   . Leukocytosis 08/18/2018  . Morbid obesity ([place])   . New onset type 2 diabetes mellitus ([place]) 11/08/2017  . OSA (obstructive sleep apnea)   . Pain due to onychomycosis of toenails of both feet 09/11/2019  . Peripheral neuropathy 02/22/2017  . Physical deconditioning 02/22/2017  . Primary osteoarthritis, left shoulder 03/05/2017  . PTSD (post-traumatic stress disorder)   . Pure hypercholesterolemia   . QT prolongation 03/07/2019  . Right foot pain 01/13/2018  . Shortness of breath   . Sinus tachycardia 03/07/2019  . Sleep apnea    CPAP  . Type 2 diabetes mellitus with vascular disease ([place]) 09/11/2019    Family History  Problem Relation Age of Onset  . CAD Maternal Grandfather   . Diabetes Other   . Diabetes Mellitus II Neg Hx   . Colon cancer Neg Hx   . Esophageal cancer Neg Hx   . Inflammatory bowel disease Neg Hx   . Liver disease Neg Hx   . Pancreatic cancer Neg Hx   . Rectal cancer Neg Hx   . Stomach cancer Neg Hx     SOCIAL HISTORY: Social History   Socioeconomic History  . Marital status: Single    Spouse name: Not on file  . Number of children: 1  . Years of education: Not on file  . Highest education level: Not on file  Occupational History  . Occupation: retired  Tobacco Use  . Smoking status: Former Smoker    Quit date: 03/07/2016    Years since quitting: 3.8  . Smokeless tobacco: Never Used  Substance and Sexual Activity  . Alcohol use: Not Currently    Comment: rare  . Drug use: No  . Sexual activity: Not on file  Other Topics Concern  . Not on file  Social History Narrative  . Not on file   Social Determinants of Health   Financial Resource Strain:   . Difficulty of Paying Living Expenses:   Food Insecurity:   . Worried About Charity fundraiser in  the Last Year:   . Arboriculturist in the Last Year:   Transportation Needs:   . Film/video editor (Medical):   [name] Lack of Transportation (Non-Medical):   Physical Activity:   . Days of Exercise per Week:   . Minutes of Exercise per Session:   Stress:   . Feeling of Stress :   Social Connections:   . Frequency of Communication with Friends and Family:   . Frequency of Social Gatherings with Friends and Family:   . Attends Religious Services:   . Active Member of Clubs or Organizations:   . Attends Archivist Meetings:   [name] Marital Status:   Intimate Partner Violence:   . Fear of Current or Ex-Partner:   . Emotionally Abused:   [name] Physically Abused:   .  Sexually Abused:     Allergies  Allergen Reactions  . Vancomycin Other (See Comments)    "Red Man Syndrome"  . Tubersol [Tuberculin] Other (See Comments)    Reaction unknown  . Doxycycline Rash    Current Outpatient Medications  Medication Sig Dispense Refill  . acetaminophen (TYLENOL) 650 MG CR tablet Take 1,300 mg by mouth every 8 (eight) hours as needed for pain.    . allopurinol (ZYLOPRIM) 100 MG tablet Take 1 tablet (100 mg total) by mouth daily. (Patient taking differently: Take 200 mg by mouth daily. ) 30 tablet 0  . ARIPiprazole (ABILIFY) 5 MG tablet Take 5 mg by mouth at bedtime.     . aspirin EC 81 MG EC tablet Take 1 tablet (81 mg total) by mouth daily.    . atorvastatin (LIPITOR) 40 MG tablet Take 1 tablet (40 mg total) by mouth daily at 6 PM. 90 tablet 3  . blood glucose meter kit and supplies Dispense based on patient and insurance preference. Use up to four times daily as directed. (FOR ICD-10 E10.9, E11.9). 1 each 0  . cephALEXin (KEFLEX) 500 MG capsule Take 1 capsule (500 mg total) by mouth 3 (three) times daily. 21 capsule 0  . cetirizine (ZYRTEC) 10 MG tablet Take 10 mg by mouth at bedtime.     . colchicine 0.6 MG tablet Take 0.6 mg by mouth as needed (gout).     . Dulaglutide (TRULICITY) 1.5  MG/0.5ML SOPN Inject into the skin once a week. On Monday    . ferrous sulfate 324 (65 Fe) MG TBEC Take 324 mg by mouth daily with breakfast.     . fluticasone (FLONASE) 50 MCG/ACT nasal spray Place 2 sprays into both nostrils daily.     . furosemide (LASIX) 80 MG tablet Take 1 tablet (80 mg total) by mouth 2 (two) times daily. (Patient taking differently: Take 80-160 mg by mouth 2 (two) times daily. 160mg in the morning and 80mg at night.) 180 tablet 3  . glipiZIDE (GLUCOTROL) 5 MG tablet Take 1 tablet (5 mg total) by mouth 2 (two) times daily. 60 tablet 0  . lisinopril (ZESTRIL) 10 MG tablet Take 1 tablet (10 mg total) by mouth daily. 90 tablet 3  . loperamide (IMODIUM) 2 MG capsule Take 2 mg by mouth 2 (two) times a day.     . meloxicam (MOBIC) 7.5 MG tablet Take 7.5 mg by mouth daily as needed for pain.    . metFORMIN (GLUCOPHAGE) 1000 MG tablet Take 1,000 mg by mouth 2 (two) times daily with a meal.    . metoprolol succinate (TOPROL-XL) 25 MG 24 hr tablet Take 3 tablets (75 mg total) by mouth 2 (two) times daily. Take with or immediately following a meal. 180 tablet 7  . prazosin (MINIPRESS) 2 MG capsule Take 4 mg by mouth at bedtime.    . pregabalin (LYRICA) 100 MG capsule Take 100 mg by mouth in the morning, at noon, and at bedtime.     . propranolol (INDERAL) 10 MG tablet TAKE ONE TABLET BY MOUTH AS NEEDED 10 tablet 10  . silver sulfADIAZINE (SILVADENE) 1 % cream Apply 1 application topically See admin instructions. Apply as directed once a day as directed to bilateral legs    . temazepam (RESTORIL) 15 MG capsule Take 15 mg by mouth at bedtime.     . traZODone (DESYREL) 100 MG tablet Take 200 mg by mouth at bedtime.    . Trolamine Salicylate (ASPERCREME EX)   Apply 1 application topically 2 (two) times daily as needed (pain and inflammation).    . venlafaxine XR (EFFEXOR-XR) 150 MG 24 hr capsule Take 150 mg by mouth daily with breakfast.    . Vitamin D, Ergocalciferol, (DRISDOL) 1.25 MG  (50000 UNIT) CAPS capsule Take 50,000 Units by mouth every 7 (seven) days.     No current facility-administered medications for this visit.    REVIEW OF SYSTEMS:  [X] denotes positive finding, [ ] denotes negative finding Cardiac  Comments:  Chest pain or chest pressure:    Shortness of breath upon exertion: x   Short of breath when lying flat: x   Irregular heart rhythm:        Vascular    Pain in calf, thigh, or hip brought on by ambulation:    Pain in feet at night that wakes you up from your sleep:     Blood clot in your veins:    Leg swelling:  x       Pulmonary    Oxygen at home:    Productive cough:     Wheezing:         Neurologic    Sudden weakness in arms or legs:     Sudden numbness in arms or legs:     Sudden onset of difficulty speaking or slurred speech:    Temporary loss of vision in one eye:     Problems with dizziness:         Gastrointestinal    Blood in stool:     Vomited blood:         Genitourinary    Burning when urinating:     Blood in urine:        Psychiatric    Major depression:         Hematologic    Bleeding problems:    Problems with blood clotting too easily:        Skin    Rashes or ulcers: x       Constitutional    Fever or chills:     PHYSICAL EXAM:   Vitals:   01/16/20 1355  BP: 128/67  Pulse: 95  Resp: 20  Temp: 98.2 F (36.8 C)  TempSrc: Temporal  SpO2: 93%  Weight: (!) 427 lb (193.7 kg)  Height: 6' 2" (1.88 m)    GENERAL: The patient is a well-nourished male, in no acute distress. The vital signs are documented above. CARDIAC: There is a regular rate and rhythm.  VASCULAR: I do not detect carotid bruits. I cannot palpate pedal pulses. He has a brisk dorsalis pedis and posterior tibial signal bilaterally.  These are however monophasic. He has significant bilateral lower extremity swelling with open ulcers in the right leg and bilateral hyperpigmentation. He has cellulitis bilaterally.        I did  look at his right great saphenous vein myself with the SonoSite and it has reflux from the saphenofemoral junction of the distal thigh.  The vein is significantly dilated. PULMONARY: There is good air exchange bilaterally without wheezing or rales. ABDOMEN: Soft and non-tender with normal pitched bowel sounds.  MUSCULOSKELETAL: There are no major deformities or cyanosis. NEUROLOGIC: No focal weakness or paresthesias are detected. SKIN: There are no ulcers or rashes noted. PSYCHIATRIC: The patient has a normal affect.  DATA:    VENOUS DUPLEX: I have independently interpreted his venous duplex scan.  This study was done on 01/02/2020.  On the right side there is   no evidence of DVT or superficial venous thrombosis.  There is deep venous reflux involving the common femoral vein.  There is reflux in the right great saphenous vein in the thigh from the saphenofemoral junction to the distal thigh.  The diameters of the vein range from 0.57-0.8 cm.  On the left side there is no evidence of DVT or superficial venous thrombosis.  There is deep venous reflux involving the common femoral vein.  There is no superficial venous reflux on the left.  A total of 60 minutes was spent on this visit. 30 minutes was face to face time. More than 50% of the time was spent on counseling and coordinating with the patient.

## 2020-01-21 ENCOUNTER — Other Ambulatory Visit: Payer: Self-pay | Admitting: *Deleted

## 2020-01-21 DIAGNOSIS — R600 Localized edema: Secondary | ICD-10-CM

## 2020-01-21 DIAGNOSIS — I872 Venous insufficiency (chronic) (peripheral): Secondary | ICD-10-CM

## 2020-02-04 ENCOUNTER — Emergency Department (HOSPITAL_COMMUNITY)
Admission: EM | Admit: 2020-02-04 | Discharge: 2020-02-06 | Disposition: A | Discharge status: Home / Self Care | Payer: No Typology Code available for payment source | Attending: Emergency Medicine | Admitting: Emergency Medicine

## 2020-02-04 ENCOUNTER — Emergency Department (HOSPITAL_COMMUNITY): Payer: No Typology Code available for payment source

## 2020-02-04 ENCOUNTER — Encounter (HOSPITAL_COMMUNITY): Payer: Self-pay

## 2020-02-04 DIAGNOSIS — L03115 Cellulitis of right lower limb: Secondary | ICD-10-CM | POA: Insufficient documentation

## 2020-02-04 DIAGNOSIS — Z79899 Other long term (current) drug therapy: Secondary | ICD-10-CM | POA: Insufficient documentation

## 2020-02-04 DIAGNOSIS — R0602 Shortness of breath: Secondary | ICD-10-CM

## 2020-02-04 DIAGNOSIS — Z87891 Personal history of nicotine dependence: Secondary | ICD-10-CM | POA: Diagnosis not present

## 2020-02-04 DIAGNOSIS — I110 Hypertensive heart disease with heart failure: Secondary | ICD-10-CM | POA: Insufficient documentation

## 2020-02-04 DIAGNOSIS — I5032 Chronic diastolic (congestive) heart failure: Secondary | ICD-10-CM | POA: Insufficient documentation

## 2020-02-04 DIAGNOSIS — Z7982 Long term (current) use of aspirin: Secondary | ICD-10-CM | POA: Diagnosis not present

## 2020-02-04 DIAGNOSIS — J441 Chronic obstructive pulmonary disease with (acute) exacerbation: Secondary | ICD-10-CM | POA: Diagnosis not present

## 2020-02-04 DIAGNOSIS — Z7984 Long term (current) use of oral hypoglycemic drugs: Secondary | ICD-10-CM | POA: Insufficient documentation

## 2020-02-04 DIAGNOSIS — E119 Type 2 diabetes mellitus without complications: Secondary | ICD-10-CM | POA: Insufficient documentation

## 2020-02-04 LAB — CBC
HCT: 42 % (ref 39.0–52.0)
Hemoglobin: 13.2 g/dL (ref 13.0–17.0)
MCH: 28.8 pg (ref 26.0–34.0)
MCHC: 31.4 g/dL (ref 30.0–36.0)
MCV: 91.5 fL (ref 80.0–100.0)
Platelets: 248 10*3/uL (ref 150–400)
RBC: 4.59 MIL/uL (ref 4.22–5.81)
RDW: 14 % (ref 11.5–15.5)
WBC: 12.8 10*3/uL — ABNORMAL HIGH (ref 4.0–10.5)
nRBC: 0 % (ref 0.0–0.2)

## 2020-02-04 LAB — BASIC METABOLIC PANEL
Anion gap: 12 (ref 5–15)
BUN: 20 mg/dL (ref 8–23)
CO2: 31 mmol/L (ref 22–32)
Calcium: 9.5 mg/dL (ref 8.9–10.3)
Chloride: 96 mmol/L — ABNORMAL LOW (ref 98–111)
Creatinine, Ser: 0.97 mg/dL (ref 0.61–1.24)
GFR calc Af Amer: >60 mL/min (ref >60)
GFR calc non Af Amer: >60 mL/min (ref >60)
Glucose, Bld: 144 mg/dL — ABNORMAL HIGH (ref 70–99)
Potassium: 4 mmol/L (ref 3.5–5.1)
Sodium: 139 mmol/L (ref 135–145)

## 2020-02-04 LAB — BRAIN NATRIURETIC PEPTIDE: B Natriuretic Peptide: 49.8 pg/mL (ref 0.0–100.0)

## 2020-02-04 NOTE — ED Triage Notes (Signed)
Pt comes via [facility] from home for SOB, hx of COPD, pt has bilateral lower leg cellulitis.

## 2020-02-05 LAB — TROPONIN I (HIGH SENSITIVITY): Troponin I (High Sensitivity): 6 ng/L (ref <18)

## 2020-02-05 MED ORDER — CEPHALEXIN 500 MG PO CAPS
500.0000 mg | ORAL_CAPSULE | Freq: Two times a day (BID) | ORAL | 0 refills | Status: AC
Start: 2020-02-05 — End: 2020-02-12

## 2020-02-05 MED ORDER — PREDNISONE 10 MG PO TABS: 40.0000 mg | ORAL_TABLET | Freq: Every day | ORAL | 0 refills | Status: AC

## 2020-02-05 MED ORDER — ALBUTEROL SULFATE HFA 108 (90 BASE) MCG/ACT IN AERS
4.0000 | INHALATION_SPRAY | Freq: Once | RESPIRATORY_TRACT | Status: AC
  Administered 2020-02-05: 4 via RESPIRATORY_TRACT
  Filled 2020-02-05: qty 6.7

## 2020-02-05 MED ORDER — PREDNISONE 20 MG PO TABS
60.0000 mg | ORAL_TABLET | Freq: Once | ORAL | Status: AC
  Administered 2020-02-05: 60 mg via ORAL
  Filled 2020-02-05 (×2): qty 3

## 2020-02-05 MED ORDER — HYDROCODONE-ACETAMINOPHEN 5-325 MG PO TABS
1.0000 | ORAL_TABLET | Freq: Once | ORAL | Status: AC
  Administered 2020-02-05: 1 via ORAL
  Filled 2020-02-05: qty 1

## 2020-02-05 MED ORDER — FUROSEMIDE 20 MG PO TABS
160.0000 mg | ORAL_TABLET | Freq: Once | ORAL | Status: AC
  Administered 2020-02-05: 160 mg via ORAL
  Filled 2020-02-05: qty 8

## 2020-02-05 NOTE — ED Provider Notes (Signed)
[facility] EMERGENCY DEPARTMENT Provider Note   CSN: [id number] Arrival date & time: 02/04/20  2108     History No chief complaint on file.   Lawrence [name] is a 64 y.o. male.  HPI     Presents with shortness of breath Last night began to feel short of breath around 5PM, thinks started maybe  alittle bit the day before Last night was worse however Put bed up at an angle which seemed to help some but still had dyspnea Redness of bilateral legs, had tried compression socks, going to vein specialist, can't get the compression socks on.  Leg pain getting worse, seeing vascular specialist, seeing Dr. Doren Custard Swelling was not specifically worse prior to ocming to ED but after sitting in chair all night in waiting room is worse.  Productive cough for 4-5 days, clear from what he can tell Wheezing more, does not have an inhaler right now No rhinnorhea, allergies have been present (some congestion)-taking zyrtec and flonase No fevers Not around anyone sick Dyspnea worse with walking Dull ache, comes and goes, left side for a few months, seeing cardologist in July  Past Medical History:  Diagnosis Date  . Acute on chronic diastolic CHF (congestive heart failure) ([place]) 01/13/2018  . Acute respiratory failure with hypoxia ([place]) 01/13/2018  . Adenomatous colon polyp 2007  . Anasarca 01/31/2017  . Anemia   . Anxiety   . Arthritis   . Asthma   . Bilateral lower extremity edema 01/31/2017  . Bilateral lower leg cellulitis   . BPH without obstruction/lower urinary tract symptoms 02/22/2017  . Cellulitis 11/07/2017  . Cellulitis of lower extremity   . Chest pain   . Chronic cough   . Chronic diastolic CHF (congestive heart failure) ([place])   . Chronic venous stasis 03/07/2019  . COPD (chronic obstructive pulmonary disease) ([place])   . Coronary artery calcification seen on CAT scan   . Depression   . Diabetic neuropathy ([place]) 09/11/2019  . Hematuria 02/22/2017  . History of  anemia 12/26/2018  . History of cardiac cath    a.  01/2018 with angiographically minimal coronary disease with  up to 40% prox LAD and 20% mid LCx and OM2 with normal LVF and normal LVEDP..  . History of colon polyps 08/24/2018  . Hypertension   . Leukocytosis 08/18/2018  . Morbid obesity ([place])   . New onset type 2 diabetes mellitus ([place]) 11/08/2017  . OSA (obstructive sleep apnea)   . Pain due to onychomycosis of toenails of both feet 09/11/2019  . Peripheral neuropathy 02/22/2017  . Physical deconditioning 02/22/2017  . Primary osteoarthritis, left shoulder 03/05/2017  . PTSD (post-traumatic stress disorder)   . Pure hypercholesterolemia   . QT prolongation 03/07/2019  . Right foot pain 01/13/2018  . Shortness of breath   . Sinus tachycardia 03/07/2019  . Sleep apnea    CPAP  . Type 2 diabetes mellitus with vascular disease ([place]) 09/11/2019    Patient Active Problem List   Diagnosis Date Noted  . Cellulitis of right leg 12/04/2019  . Pain due to onychomycosis of toenails of both feet 09/11/2019  . Diabetic neuropathy ([place]) 09/11/2019  . Type 2 diabetes mellitus with vascular disease ([place]) 09/11/2019  . Sinus tachycardia 03/07/2019  . Chronic venous stasis 03/07/2019  . QT prolongation 03/07/2019  . History of anemia 12/26/2018  . Anemia 08/24/2018  . History of colon polyps 08/24/2018  . Do not resuscitate status 08/24/2018  . Chronic diastolic CHF (congestive  heart failure) ([place]) 08/18/2018  . Leukocytosis 08/18/2018  . Bilateral lower leg cellulitis   . Coronary artery calcification seen on CAT scan   . Pure hypercholesterolemia   . Chest pain   . Shortness of breath   . Acute respiratory failure with hypoxia ([place]) 01/13/2018  . Right foot pain 01/13/2018  . Acute on chronic diastolic CHF (congestive heart failure) ([place]) 01/13/2018  . Diabetes mellitus type 2 in obese ([place]) 01/13/2018  . New onset type 2 diabetes mellitus ([place]) 11/08/2017  . Cellulitis 11/07/2017  . Primary  osteoarthritis, left shoulder 03/05/2017  . Essential hypertension, benign 02/22/2017  . BPH without obstruction/lower urinary tract symptoms 02/22/2017  . Hematuria 02/22/2017  . Peripheral neuropathy 02/22/2017  . Physical deconditioning 02/22/2017  . Morbid obesity ([place])   . PTSD (post-traumatic stress disorder)   . Anasarca 01/31/2017  . Bilateral lower extremity edema 01/31/2017  . Cellulitis of lower extremity   . OSA (obstructive sleep apnea)     Past Surgical History:  Procedure Laterality Date  . JOINT REPLACEMENT     left knee replacement x 2  . KNEE ARTHROSCOPY Bilateral   . LEFT HEART CATH AND CORONARY ANGIOGRAPHY N/A 01/17/2018   Procedure: LEFT HEART CATH AND CORONARY ANGIOGRAPHY;  Surgeon: Leonie Man, MD;  Location: [facility];  Service: Cardiovascular;  Laterality: N/A;  . UMBILICAL HERNIA REPAIR         Family History  Problem Relation Age of Onset  . CAD Maternal Grandfather   . Diabetes Other   . Diabetes Mellitus II Neg Hx   . Colon cancer Neg Hx   . Esophageal cancer Neg Hx   . Inflammatory bowel disease Neg Hx   . Liver disease Neg Hx   . Pancreatic cancer Neg Hx   . Rectal cancer Neg Hx   . Stomach cancer Neg Hx     Social History   Tobacco Use  . Smoking status: Former Smoker    Quit date: 03/07/2016    Years since quitting: 3.9  . Smokeless tobacco: Never Used  Substance Use Topics  . Alcohol use: Not Currently    Comment: rare  . Drug use: No    Home Medications Prior to Admission medications   Medication Sig Start Date End Date Taking? Authorizing Provider  acetaminophen (TYLENOL) 650 MG CR tablet Take 1,300 mg by mouth every 8 (eight) hours as needed for pain.    [provider]  allopurinol (ZYLOPRIM) 100 MG tablet Take 1 tablet (100 mg total) by mouth daily. Patient taking differently: Take 200 mg by mouth daily.  01/18/18   Domenic Polite, MD  ARIPiprazole (ABILIFY) 5 MG tablet Take 5 mg by mouth at  bedtime.     [provider]  aspirin EC 81 MG EC tablet Take 1 tablet (81 mg total) by mouth daily. 01/19/18   Domenic Polite, MD  atorvastatin (LIPITOR) 40 MG tablet Take 1 tablet (40 mg total) by mouth daily at 6 PM. 09/24/19   Turner, Eber Hong, MD  blood glucose meter kit and supplies Dispense based on patient and insurance preference. Use up to four times daily as directed. (FOR ICD-10 E10.9, E11.9). 11/09/17   Eugenie Filler, MD  cephALEXin (KEFLEX) 500 MG capsule Take 1 capsule (500 mg total) by mouth 2 (two) times daily for 7 days. 02/05/20 02/12/20  Gareth Morgan, MD  cetirizine (ZYRTEC) 10 MG tablet Take 10 mg by mouth at bedtime.     [provider]  colchicine 0.6 MG tablet Take 0.6 mg by mouth as needed (gout).  08/27/19   [provider]  Dulaglutide (TRULICITY) 1.5 NW/2.9FA SOPN Inject into the skin once a week. On Monday    [provider]  ferrous sulfate 324 (65 Fe) MG TBEC Take 324 mg by mouth daily with breakfast.     [provider]  fluticasone (FLONASE) 50 MCG/ACT nasal spray Place 2 sprays into both nostrils daily.     [provider]  furosemide (LASIX) 80 MG tablet Take 1 tablet (80 mg total) by mouth 2 (two) times daily. Patient taking differently: Take 80-160 mg by mouth 2 (two) times daily. 145m in the morning and 825mat night. 09/24/19   TuSueanne MargaritaMD  glipiZIDE (GLUCOTROL) 5 MG tablet Take 1 tablet (5 mg total) by mouth 2 (two) times daily. 03/09/19 03/08/20  AdShelly CossMD  lisinopril (ZESTRIL) 10 MG tablet Take 1 tablet (10 mg total) by mouth daily. 09/24/19   TuSueanne MargaritaMD  loperamide (IMODIUM) 2 MG capsule Take 2 mg by mouth 2 (two) times a day.     [provider]  meloxicam (MOBIC) 7.5 MG tablet Take 7.5 mg by mouth daily as needed for pain.    [provider]  metFORMIN (GLUCOPHAGE) 1000 MG tablet Take 1,000 mg by mouth 2 (two) times daily with a meal.    [provider]   metoprolol succinate (TOPROL-XL) 25 MG 24 hr tablet Take 3 tablets (75 mg total) by mouth 2 (two) times daily. Take with or immediately following a meal. 08/13/19   UrBaldwin JamaicaPA-C  prazosin (MINIPRESS) 2 MG capsule Take 4 mg by mouth at bedtime.    [provider]  predniSONE (DELTASONE) 10 MG tablet Take 4 tablets (40 mg total) by mouth daily for 4 days. 02/05/20 02/09/20  ScGareth MorganMD  pregabalin (LYRICA) 100 MG capsule Take 100 mg by mouth in the morning, at noon, and at bedtime.  07/14/19   [provider]  propranolol (INDERAL) 10 MG tablet TAKE ONE TABLET BY MOUTH AS NEEDED 12/19/19   TuSueanne MargaritaMD  silver sulfADIAZINE (SILVADENE) 1 % cream Apply 1 application topically See admin instructions. Apply as directed once a day as directed to bilateral legs    [provider]  temazepam (RESTORIL) 15 MG capsule Take 15 mg by mouth at bedtime.     [provider]  traZODone (DESYREL) 100 MG tablet Take 200 mg by mouth at bedtime.    [provider]  Trolamine Salicylate (ASPERCREME EX) Apply 1 application topically 2 (two) times daily as needed (pain and inflammation).    [provider]  venlafaxine XR (EFFEXOR-XR) 150 MG 24 hr capsule Take 150 mg by mouth daily with breakfast.    [provider]  Vitamin D, Ergocalciferol, (DRISDOL) 1.25 MG (50000 UNIT) CAPS capsule Take 50,000 Units by mouth every 7 (seven) days.    [provider]    Allergies    Vancomycin, Tubersol [tuberculin], and Doxycycline  Review of Systems   Review of Systems  Constitutional: Negative for fever.  HENT: Negative for sore throat.   Eyes: Negative for visual disturbance.  Respiratory: Positive for cough and shortness of breath.   Cardiovascular: Positive for chest pain and leg swelling.  Gastrointestinal: Negative for abdominal pain, nausea and vomiting.  Genitourinary: Negative for difficulty urinating.  Musculoskeletal:  Negative for back pain and neck stiffness.  Skin:  Positive for rash.  Neurological: Negative for syncope and headaches.    Physical Exam Updated Vital Signs BP 140/90   Pulse 100   Temp 98 F (36.7 C) (Oral)   Resp 18   SpO2 94%   Physical Exam Vitals and nursing note reviewed.  Constitutional:      General: He is not in acute distress.    Appearance: He is well-developed. He is not diaphoretic.  HENT:     Head: Normocephalic and atraumatic.  Eyes:     Conjunctiva/sclera: Conjunctivae normal.  Cardiovascular:     Rate and Rhythm: Normal rate and regular rhythm.     Heart sounds: Murmur heard.  No friction rub. No gallop.   Pulmonary:     Effort: Pulmonary effort is normal. No respiratory distress.     Breath sounds: Wheezing present. No rales.  Abdominal:     General: There is no distension.     Palpations: Abdomen is soft.     Tenderness: There is no abdominal tenderness. There is no guarding.  Musculoskeletal:     Cervical back: Normal range of motion.     Right lower leg: Edema present.     Left lower leg: Edema present.  Skin:    General: Skin is warm and dry.     Comments: Erythema bilateral lower legs, right slightly worse than left  Neurological:     Mental Status: He is alert and oriented to person, place, and time.     ED Results / Procedures / Treatments   Labs (all labs ordered are listed, but only abnormal results are displayed) Labs Reviewed  CBC - Abnormal; Notable for the following components:      Result Value   WBC 12.8 (*)    All other components within normal limits  BASIC METABOLIC PANEL - Abnormal; Notable for the following components:   Chloride 96 (*)    Glucose, Bld 144 (*)    All other components within normal limits  BRAIN NATRIURETIC PEPTIDE  TROPONIN I (HIGH SENSITIVITY)  TROPONIN I (HIGH SENSITIVITY)    EKG EKG Interpretation  Date/Time:  Tuesday February 04 2020 21:13:54 EDT Ventricular Rate:  76 PR Interval:  214 QRS  Duration: 104 QT Interval:  392 QTC Calculation: 441 R Axis:   34 Text Interpretation: Sinus rhythm with 1st degree A-V block with Premature atrial complexes Low voltage QRS Cannot rule out Anterior infarct , age undetermined Abnormal ECG No significant change since last tracing Confirmed by Gareth Morgan [phone]) on 02/05/2020 8:44:05 AM   Radiology DG Chest 2 View  Result Date: 02/04/2020 CLINICAL DATA:  Shortness of breath EXAM: CHEST - 2 VIEW COMPARISON:  Radiograph 11/20/2019.  Chest CT 09/09/2019 FINDINGS: Chronic cardiomegaly, unchanged. Unchanged mediastinal contours. Progressive peribronchial thickening from prior exam. Increasing streaky bibasilar atelectasis. No pneumothorax or pleural effusion. Trace fluid in the fissures as before. Degenerative change throughout the spine. IMPRESSION: 1. Progressive peribronchial thickening from prior exam, may be pulmonary edema or bronchitis. Chronic cardiomegaly unchanged. 2. Increasing streaky bibasilar atelectasis. Electronically Signed   By: Keith Rake M.D.   On: 02/04/2020 21:36    Procedures Procedures (including critical care time)  Medications Ordered in ED Medications  predniSONE (DELTASONE) tablet 60 mg (60 mg Oral Given 02/05/20 1010)  albuterol (VENTOLIN HFA) 108 (90 Base) MCG/ACT inhaler 4 puff (4 puffs Inhalation Given 02/05/20 0954)  furosemide (LASIX) tablet 160 mg (160 mg Oral Given 02/05/20 1011)  HYDROcodone-acetaminophen (NORCO/VICODIN) 5-325 MG per tablet 1 tablet (1  tablet Oral Given 02/05/20 1133)    ED Course  I have reviewed the triage vital signs and the nursing notes.  Pertinent labs & imaging results that were available during my care of the patient were reviewed by me and considered in my medical decision making (see chart for details).    MDM Rules/Calculators/A&P                          64 year old male with a history of COPD, diastolic CHF, diabetes, bilateral lower extremity cellulitis and venous  stasis who presents with concern for productive cough for 4 days, shortness of breath beginning yesterday.  Labs show no significant anemia or electrolyte abnormalities.  Chest x-ray shows progressive peribronchial thickening which may be pulmonary edema or bronchitis.  He does have a mild leukocytosis.  He has no tachypnea, oxygen saturations are 94%.  Low suspicion for PE given no asymmetric swelling, presence of productive cough  Suspect likely combination of COPD exacerbation with bronchitis and mild volume overload.  Typically takes 80 mg of Lasix daily, will increase this morning to 160 mg of Lasix for one dose.  Given prednisone, albuterol for COPD exacerbation.  Reports dull chronic chest pain, and troponin checked and negative.  Has findings consistent with bilateral venous stasis on exam however erythema on right slightly worse and reports worsening pain. Will give keflex to cover cellulitis and possible bacterial bronchitis.  Patient discharged in stable condition with understanding of reasons to return.      Final Clinical Impression(s) / ED Diagnoses Final diagnoses:  COPD exacerbation ([place])  Cellulitis of right lower extremity  Shortness of breath    Rx / DC Orders ED Discharge Orders         Ordered    predniSONE (DELTASONE) 10 MG tablet  Daily     Discontinue  Reprint     02/05/20 1146    cephALEXin (KEFLEX) 500 MG capsule  2 times daily     Discontinue  Reprint     02/05/20 1146           Gareth Morgan, MD 02/05/20 2151

## 2020-02-05 NOTE — ED Notes (Signed)
Got patient on the monitor into a gown patient is resting with call bell in reach  

## 2020-02-05 NOTE — ED Notes (Signed)
PTAR- Was called but no ETA at this time.

## 2020-02-06 ENCOUNTER — Telehealth: Payer: Self-pay | Admitting: *Deleted

## 2020-02-06 NOTE — Telephone Encounter (Signed)
Katharine Look (with [facility]) is calling with questions about compression therapy of his legs.  Katharine Look states that Mr. Deiss is not able to tolerate the thigh high compression hose.  Katharine Look also states that he is unable to wear Ace wraps on his thighs without roll down due to his size.  Reviewed Dr. Nicole Cella office note and suggested that he wear knee high compression hose.

## 2020-02-09 IMAGING — CT CT ANGIO CHEST
2 of 9 series · 17 of 46 positions shown · IV contrast (OMNI)
Comparison: Chest radiograph from one day prior.

CLINICAL DATA: Acute respiratory failure with hypoxia. CHF.
Elevated D-dimer.

EXAM:
CT ANGIOGRAPHY CHEST WITH CONTRAST
TECHNIQUE: Multidetector CT imaging of the chest was performed using the
standard protocol during bolus administration of intravenous
contrast. Multiplanar CT image reconstructions and MIPs were
obtained to evaluate the vascular anatomy.
CONTRAST:  100mL LDHM2U-13G IOPAMIDOL (LDHM2U-13G) INJECTION 76%

[Series 7: thins · axial · 0.96mm/px · z∈[+1210,+1473]mm · 14 of 297 slices shown]
[im 17/297  lung]
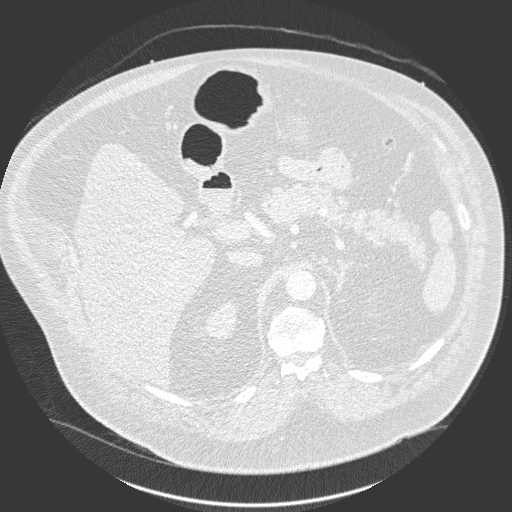
[im 33/297  soft-tissue]
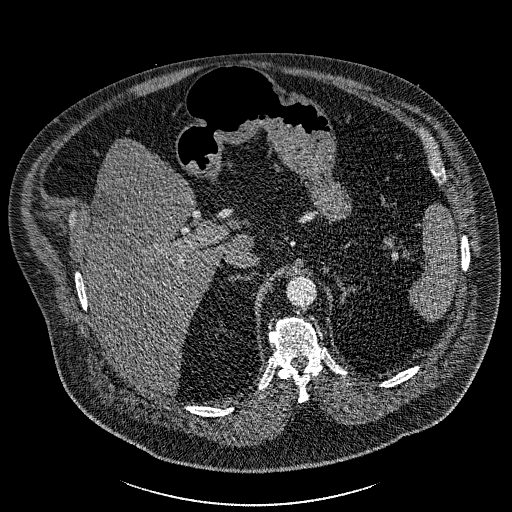
[im 66/297  lung]
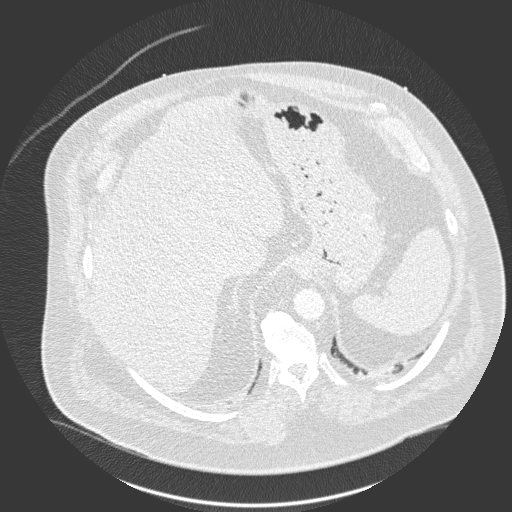
[im 83/297  soft-tissue]
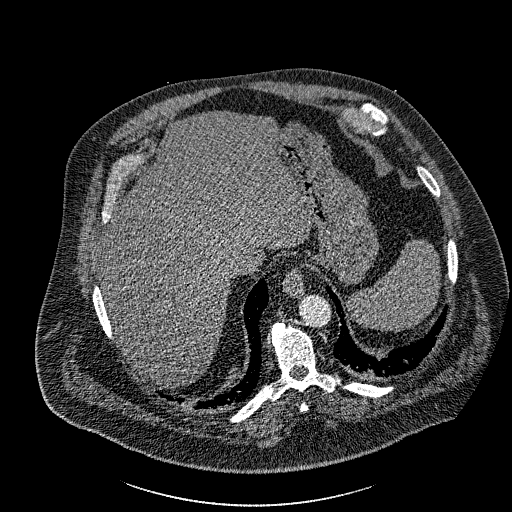
[im 99/297  lung]
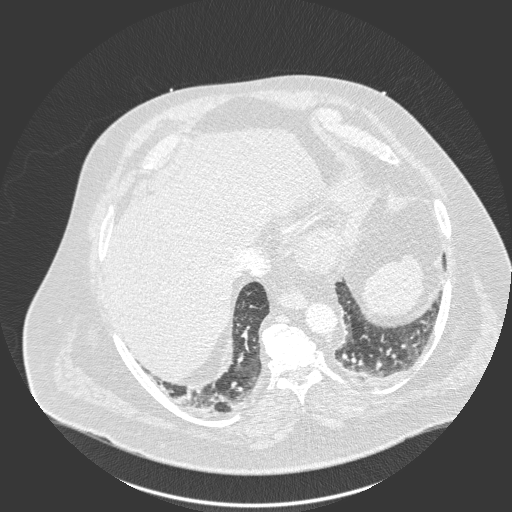
[im 116/297  soft-tissue]
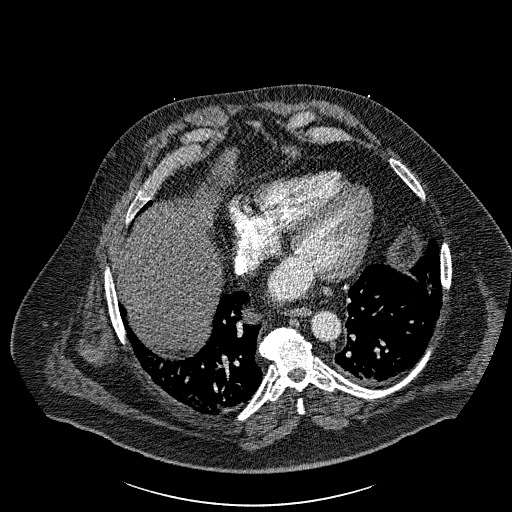
[im 132/297  lung]
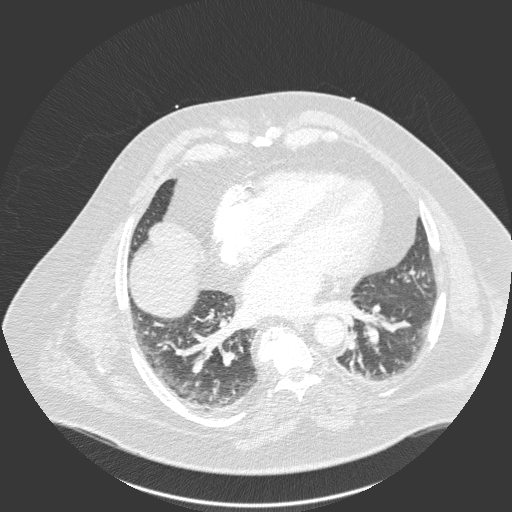
[im 165/297  soft-tissue]
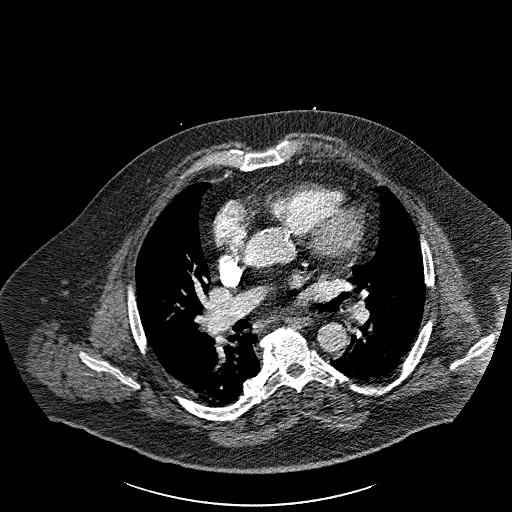
[im 181/297  lung]
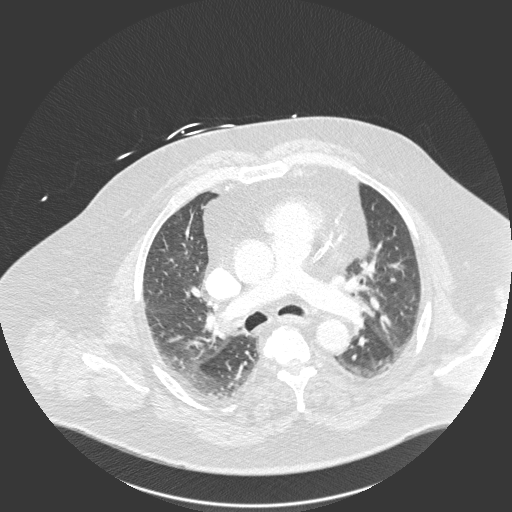
[im 198/297  soft-tissue]
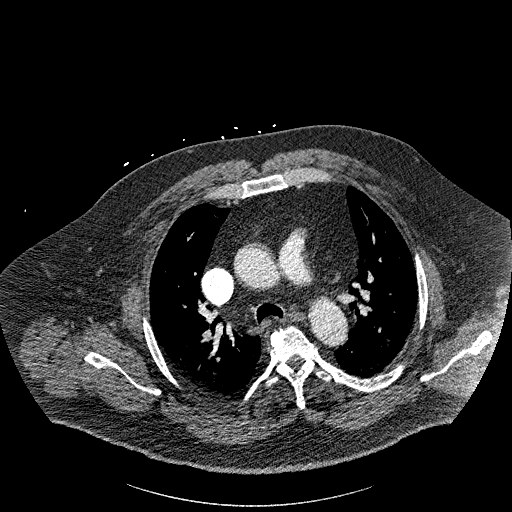
[im 214/297  lung]
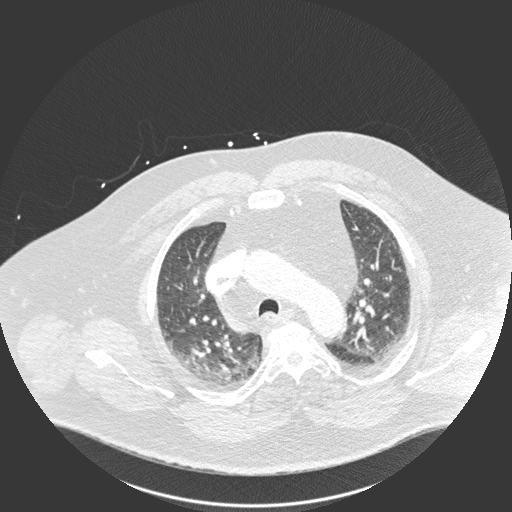
[im 231/297  soft-tissue]
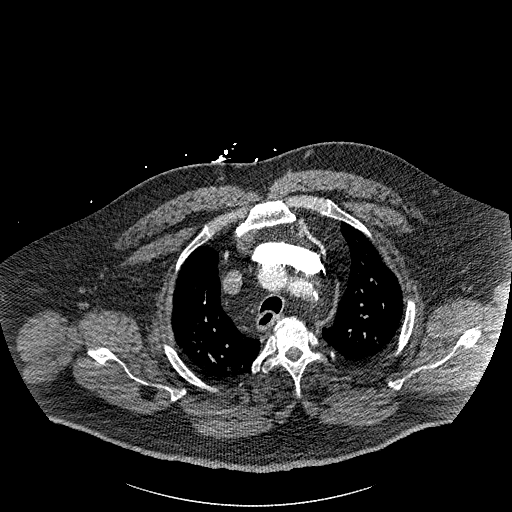
[im 264/297  lung]
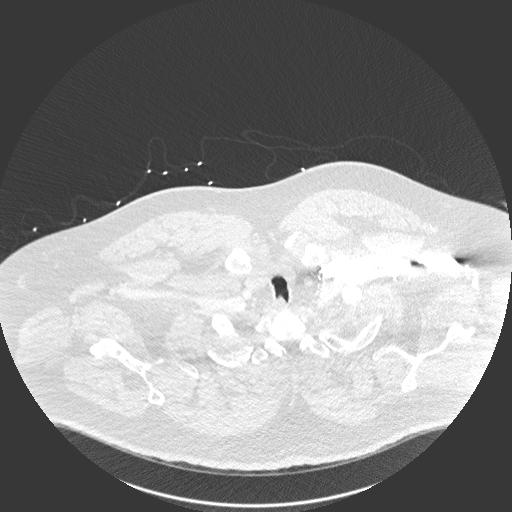
[im 280/297  soft-tissue]
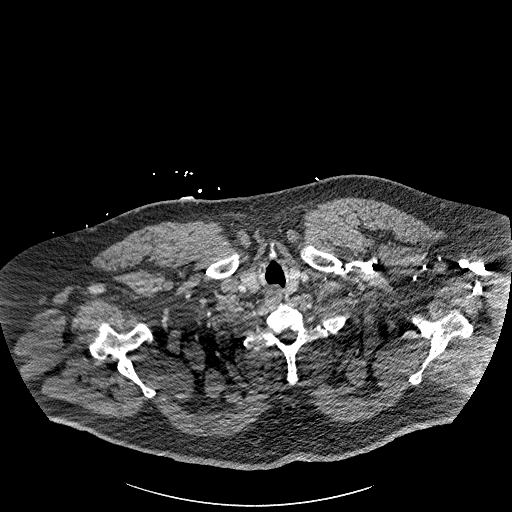

[Series 9: coronal mpr · coronal · 0.64mm/px · 3 of 182 slices shown]
[im 46/182  soft-tissue]
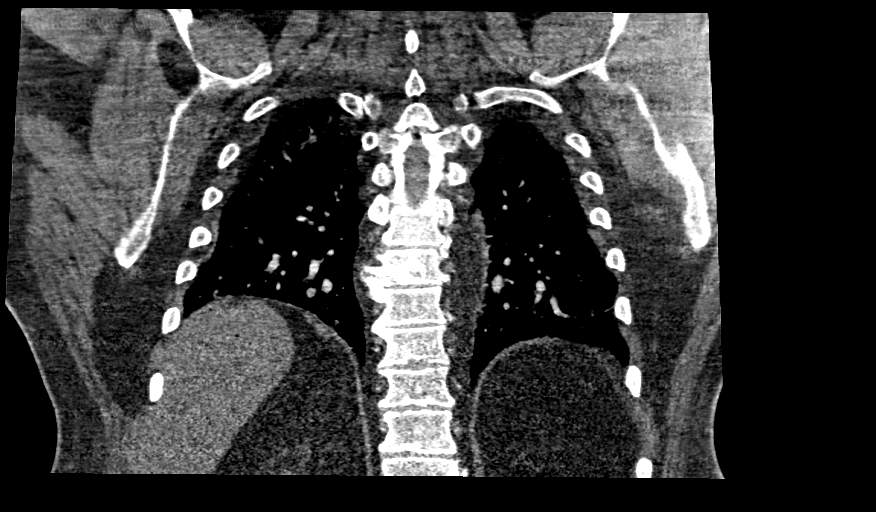
[im 91/182  soft-tissue]
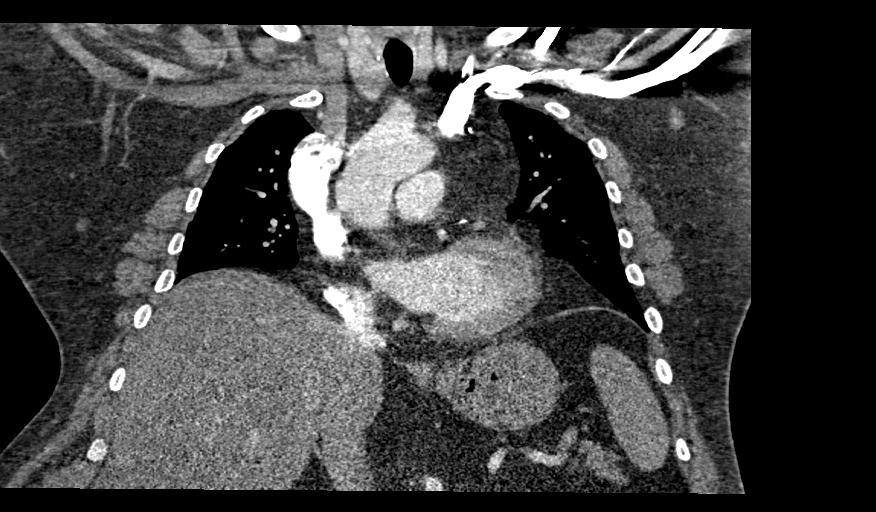
[im 136/182  soft-tissue]
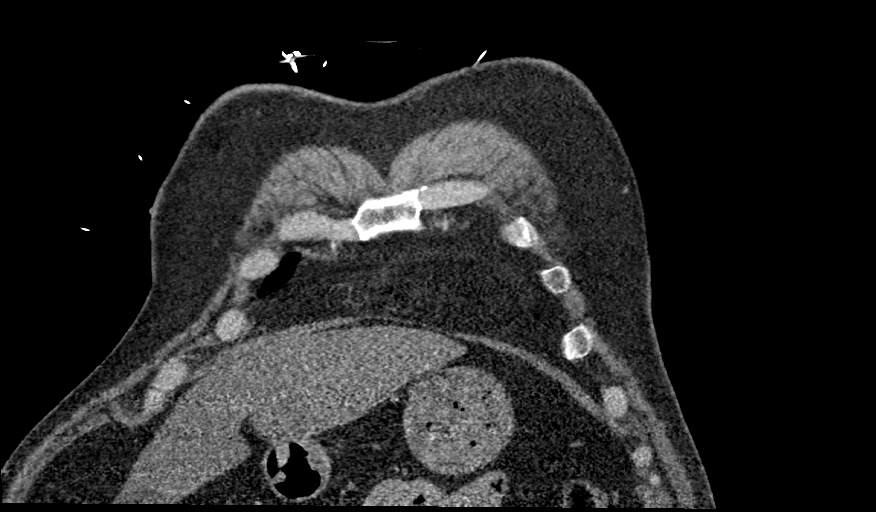

[17 of 46 positions shown; findings below may reference images not displayed]

FINDINGS: Cardiovascular: The study is moderate quality for the evaluation of
pulmonary embolism, limited by suboptimal contrast opacification of
the pulmonary arterial tree. There are no filling defects in the
central, lobar, segmental or subsegmental pulmonary artery branches
to suggest acute pulmonary embolism. Atherosclerotic nonaneurysmal
thoracic aorta. Normal caliber pulmonary arteries. Mild
cardiomegaly. No significant pericardial fluid/thickening.
Three-vessel coronary atherosclerosis.

Mediastinum/Nodes: No discrete thyroid nodules. Unremarkable
esophagus. No pathologically enlarged axillary, mediastinal or hilar
lymph nodes.

Lungs/Pleura: No pneumothorax. No pleural effusion. No acute
consolidative airspace disease, lung masses or significant pulmonary
nodules. Hypoventilatory changes in the dependent lungs.

Upper abdomen: Simple liver cysts, largest 3.0 cm in the anterior
liver.

Musculoskeletal: No aggressive appearing focal osseous lesions.
Moderate thoracic spondylosis.

Review of the MIP images confirms the above findings.
IMPRESSION: 1. No evidence of pulmonary embolism.
2. Hypoventilatory changes in the lungs, with otherwise no active
pulmonary disease.
3. Mild cardiomegaly.  Three-vessel coronary atherosclerosis.

Aortic Atherosclerosis ([id number]).

## 2020-03-13 ENCOUNTER — Ambulatory Visit (INDEPENDENT_AMBULATORY_CARE_PROVIDER_SITE_OTHER): Payer: Medicare Other | Admitting: Podiatry

## 2020-03-13 ENCOUNTER — Encounter: Payer: Self-pay | Admitting: Podiatry

## 2020-03-13 ENCOUNTER — Other Ambulatory Visit: Payer: Self-pay

## 2020-03-13 DIAGNOSIS — I878 Other specified disorders of veins: Secondary | ICD-10-CM

## 2020-03-13 DIAGNOSIS — E1142 Type 2 diabetes mellitus with diabetic polyneuropathy: Secondary | ICD-10-CM

## 2020-03-13 DIAGNOSIS — M79675 Pain in left toe(s): Secondary | ICD-10-CM

## 2020-03-13 DIAGNOSIS — B351 Tinea unguium: Secondary | ICD-10-CM | POA: Diagnosis not present

## 2020-03-13 DIAGNOSIS — M79674 Pain in right toe(s): Secondary | ICD-10-CM

## 2020-03-13 NOTE — Progress Notes (Signed)
This patient returns to my office for at risk foot care.  This patient requires this care by a professional since this patient will be at risk due to having diabetic neuropathy and angiopathy and venous stasis.  Patient has been just treated for cellulitis right leg. This patient presents to the office with his wife.This patient is unable to cut nails himself since the patient cannot reach his nails.These nails are painful walking and wearing shoes.  This patient presents for at risk foot care today.  General Appearance  Alert, conversant and in no acute stress.  Vascular  Dorsalis pedis and posterior tibial  pulses are not  palpable  billaterally due to severe swelling feet.  Capillary return is within normal limits  bilaterally. Temperature is within normal limits  Bilaterally. Venous stasis right leg.  Neurologic  Senn-Weinstein monofilament wire test absent right foot.  Diminished left foot.. Muscle power within normal limits bilaterally.  Nails Thick disfigured discolored nails with subungual debris  from hallux to fifth toes bilaterally.  Pincer hallux nails   B/L. No evidence of bacterial infection or drainage bilaterally.  Orthopedic  No limitations of motion  feet .  No crepitus or effusions noted.  No bony pathology or digital deformities noted.  Severe HAV  B/L.  Skin  normotropic skin with no porokeratosis noted bilaterally.  No signs of infections or ulcers noted.     Onychomycosis  Pain in right toes  Pain in left toes  Consent was obtained for treatment procedures.   Mechanical debridement of nails 1-5  bilaterally performed with a nail nipper.  Filed with dremel without incident.    Return office visit   3 months                   Told patient to return for periodic foot care and evaluation due to potential at risk complications.   Amera Banos DPM  

## 2020-03-17 ENCOUNTER — Telehealth: Payer: Self-pay | Admitting: Cardiology

## 2020-03-17 NOTE — Telephone Encounter (Signed)
Edited to add:  Resp. Normally 16  Today is Resp 26

## 2020-03-17 NOTE — Telephone Encounter (Signed)
I would like him transported to ER.  He may have PNA but also sounds like he has acute CHF

## 2020-03-17 NOTE — Telephone Encounter (Signed)
New Message  Santiago Glad from [facility] is calling and is asking for an order for a mobile xray. She says she hears fluid in the patients left lower lobe. She says the pt does not take his weight daily but the patients wife says he was 59 and is now 435   Call transferred to [facility]

## 2020-03-17 NOTE — Telephone Encounter (Signed)
I spoke with Santiago Glad and gave her information from Dr Radford Pax.  She will contact patient and advise him to call [phone] for transport to ED

## 2020-03-17 NOTE — Telephone Encounter (Signed)
Received call transferred directly from operator and spoke with Santiago Glad from [facility]. She is seeing patient today and noticed respirations were 24.  Usually around 16.  She has been seeing him twice weekly due to blisters on his legs. When Santiago Glad asked him about breathing patient reported having shortness of breath and chest discomfort on his left side.  Chest discomfort is worse when taking a deep breath. Crackles noted in left lung. Productive clear cough. Oxygen sat is 92%.  Usually 94-95. BP 130/90. Wife thinks shortness of breath started about 4 days ago Patient does not weigh daily but Santiago Glad reports last recorded weight was 405 lbs and today his weight is 435 lbs. Does not know time frame of the increase  No fever. Taking lasix 80 mg twice daily.  Santiago Glad reports patient is not in acute distress. She is asking if mobile chest X ray should be done. Santiago Glad is aware patient should go to ED if symptoms worsen  Will forward to Dr Radford Pax regarding possible Chest Xray or other recommendations.

## 2020-03-19 NOTE — Progress Notes (Addendum)
Cardiology Office Note:    Date:  03/20/2020   ID:  Lawrence [name], DOB Dec 09, 1955, MRN [id number]  PCP:  Jenny Reichmann, PA-C  Cardiologist:  Fransico Him, MD    Referring MD: Darra Lis*   Chief Complaint  Patient presents with  . Coronary Artery Disease  . Hypertension  . Hyperlipidemia    History of Present Illness:    Lawrence [name] is a 64 y.o. male with a hx of nonobstructive coronary artery disease, hypertension, hyperlipidemia, diastolic heart failure, OSA on BiPAP, super morbid obesity, depression, anxiety.  He is a DNR.  He was seen in the hospital 01/25/2018 for evaluation of chest pain and shortness of breath.  He was thought to be a poor candidate for ischemic evaluation due to his obesity.  Troponins were negative at that time.  Left heart catheterization was subsequently performed which showed minimal coronary artery disease.    He presented the emergency room 02/19/2019 with palpitations.  No arrhythmias were noted.  He wore a ZIO patch which showed multiple episodes of SVT. He was seen by EP and Toprol was increased to '75mg'$  BID.   Mr. Arguijo is morbidly obese.  His activity level is significantly impaired.  He uses a walker at home and a motorized chair when out.  He has chronic lower extremity edema.  He says that he is followed by kidney doctor at the [place] but has not been told that he has renal insufficiency.  He says he is followed due to his diabetes.   He is here today and is doing well.   He has chronic DOE due to supra morbid obesity which has gotten worse and now getting DOE just with walking to the bathroom. He has chronic LE edema from chronic venous insuff and is seeing a vein physician and recommended surgery for his venous insuff.  Insurance refused to pay until he used compression hose for 3 months.  He has has some dull achiness in his chest that is nonexertional and occurs about once weekly lasting a few days at a time constant.  It is not  affected by deep breathing or movement and is left sided.  There is no diaphoresis or SOB with the pain.  He denies palpitations or syncope. He uses a PAP machine at home and sometimes wakes up and has to sit up and take his mask off and feels like it is a panic attack.  He has had some problems with dizziness that he describes as room spinning. His dry weight is around 398-400lbs and he is 432lbs today.   Past Medical History:  Diagnosis Date  . Acute on chronic diastolic CHF (congestive heart failure) ([place]) 01/13/2018  . Acute respiratory failure with hypoxia ([place]) 01/13/2018  . Adenomatous colon polyp 2007  . Anasarca 01/31/2017  . Anemia   . Anxiety   . Arthritis   . Asthma   . Bilateral lower extremity edema 01/31/2017  . Bilateral lower leg cellulitis   . BPH without obstruction/lower urinary tract symptoms 02/22/2017  . Cellulitis 11/07/2017  . Cellulitis of lower extremity   . Chest pain   . Chronic cough   . Chronic diastolic CHF (congestive heart failure) ([place])   . Chronic venous stasis 03/07/2019  . COPD (chronic obstructive pulmonary disease) ([place])   . Coronary artery calcification seen on CAT scan   . Depression   . Diabetic neuropathy ([place]) 09/11/2019  . Hematuria 02/22/2017  . History of anemia 12/26/2018  .  History of cardiac cath    a.  01/2018 with angiographically minimal coronary disease with  up to 40% prox LAD and 20% mid LCx and OM2 with normal LVF and normal LVEDP..  . History of colon polyps 08/24/2018  . Hypertension   . Leukocytosis 08/18/2018  . Morbid obesity ([place])   . New onset type 2 diabetes mellitus ([place]) 11/08/2017  . OSA (obstructive sleep apnea)   . Pain due to onychomycosis of toenails of both feet 09/11/2019  . Peripheral neuropathy 02/22/2017  . Physical deconditioning 02/22/2017  . Primary osteoarthritis, left shoulder 03/05/2017  . PTSD (post-traumatic stress disorder)   . Pure hypercholesterolemia   . QT prolongation 03/07/2019  . Right foot pain 01/13/2018    . Shortness of breath   . Sinus tachycardia 03/07/2019  . Sleep apnea    CPAP  . Type 2 diabetes mellitus with vascular disease ([place]) 09/11/2019    Past Surgical History:  Procedure Laterality Date  . JOINT REPLACEMENT     left knee replacement x 2  . KNEE ARTHROSCOPY Bilateral   . LEFT HEART CATH AND CORONARY ANGIOGRAPHY N/A 01/17/2018   Procedure: LEFT HEART CATH AND CORONARY ANGIOGRAPHY;  Surgeon: Leonie Man, MD;  Location: [facility];  Service: Cardiovascular;  Laterality: N/A;  . UMBILICAL HERNIA REPAIR      Current Medications: Current Meds  Medication Sig  . acetaminophen (TYLENOL) 650 MG CR tablet Take 1,300 mg by mouth every 8 (eight) hours as needed for pain.  [name] albuterol (VENTOLIN HFA) 108 (90 Base) MCG/ACT inhaler Inhale into the lungs.  [name] allopurinol (ZYLOPRIM) 100 MG tablet Take 1 tablet (100 mg total) by mouth daily. (Patient taking differently: Take 200 mg by mouth daily. )  . ARIPiprazole (ABILIFY) 5 MG tablet Take 5 mg by mouth at bedtime.   [name] aspirin EC 81 MG EC tablet Take 1 tablet (81 mg total) by mouth daily.  [name] atorvastatin (LIPITOR) 40 MG tablet Take 1 tablet (40 mg total) by mouth daily at 6 PM.  . blood glucose meter kit and supplies Dispense based on patient and insurance preference. Use up to four times daily as directed. (FOR ICD-10 E10.9, E11.9).  . cetirizine (ZYRTEC) 10 MG tablet Take 10 mg by mouth at bedtime.   . colchicine 0.6 MG tablet Take 0.6 mg by mouth as needed (gout).   . Dulaglutide (TRULICITY) 1.5 IW/9.7LG SOPN Inject into the skin once a week. On Monday  . ferrous sulfate 324 (65 Fe) MG TBEC Take 324 mg by mouth daily with breakfast.   . fluticasone (FLONASE) 50 MCG/ACT nasal spray Place 2 sprays into both nostrils daily.   . furosemide (LASIX) 80 MG tablet Take 1 tablet (80 mg total) by mouth 2 (two) times daily. (Patient taking differently: Take 80-160 mg by mouth 2 (two) times daily. '160mg'$  in the morning and '80mg'$  at night.)   . lisinopril (ZESTRIL) 10 MG tablet Take 1 tablet (10 mg total) by mouth daily.  [name] loperamide (IMODIUM) 2 MG capsule Take 2 mg by mouth 2 (two) times a day.   . meloxicam (MOBIC) 15 MG tablet Take 15 mg by mouth daily.  . meloxicam (MOBIC) 7.5 MG tablet Take 7.5 mg by mouth daily as needed for pain.  . metFORMIN (GLUCOPHAGE) 1000 MG tablet Take 1,000 mg by mouth 2 (two) times daily with a meal.  . metoprolol succinate (TOPROL-XL) 25 MG 24 hr tablet Take 3 tablets (75 mg total) by mouth 2 (  two) times daily. Take with or immediately following a meal.  . prazosin (MINIPRESS) 2 MG capsule Take 4 mg by mouth at bedtime.  . pregabalin (LYRICA) 100 MG capsule Take 100 mg by mouth in the morning, at noon, and at bedtime.   . propranolol (INDERAL) 10 MG tablet TAKE ONE TABLET BY MOUTH AS NEEDED  . silver sulfADIAZINE (SILVADENE) 1 % cream Apply 1 application topically See admin instructions. Apply as directed once a day as directed to bilateral legs  . temazepam (RESTORIL) 15 MG capsule Take 15 mg by mouth at bedtime.   . traZODone (DESYREL) 100 MG tablet Take 200 mg by mouth at bedtime.  Loura Pardon Salicylate (ASPERCREME EX) Apply 1 application topically 2 (two) times daily as needed (pain and inflammation).  . venlafaxine XR (EFFEXOR-XR) 150 MG 24 hr capsule Take 150 mg by mouth daily with breakfast.  . Vitamin D, Ergocalciferol, (DRISDOL) 1.25 MG (50000 UNIT) CAPS capsule Take 50,000 Units by mouth every 7 (seven) days.     Allergies:   Vancomycin, Tubersol [tuberculin], and Doxycycline   Social History   Socioeconomic History  . Marital status: Single    Spouse name: Not on file  . Number of children: 1  . Years of education: Not on file  . Highest education level: Not on file  Occupational History  . Occupation: retired  Tobacco Use  . Smoking status: Former Smoker    Quit date: 03/07/2016    Years since quitting: 4.0  . Smokeless tobacco: Never Used  Substance and Sexual Activity  .  Alcohol use: Not Currently    Comment: rare  . Drug use: No  . Sexual activity: Not on file  Other Topics Concern  . Not on file  Social History Narrative  . Not on file   Social Determinants of Health   Financial Resource Strain:   . Difficulty of Paying Living Expenses:   Food Insecurity:   . Worried About Charity fundraiser in the Last Year:   . Arboriculturist in the Last Year:   Transportation Needs:   . Film/video editor (Medical):   [name] Lack of Transportation (Non-Medical):   Physical Activity:   . Days of Exercise per Week:   . Minutes of Exercise per Session:   Stress:   . Feeling of Stress :   Social Connections:   . Frequency of Communication with Friends and Family:   . Frequency of Social Gatherings with Friends and Family:   . Attends Religious Services:   . Active Member of Clubs or Organizations:   . Attends Archivist Meetings:   [name] Marital Status:      Family History: The patient's family history includes CAD in his maternal grandfather; Diabetes in an other family member. There is no history of Diabetes Mellitus II, Colon cancer, Esophageal cancer, Inflammatory bowel disease, Liver disease, Pancreatic cancer, Rectal cancer, or Stomach cancer.  ROS:   Please see the history of present illness.    ROS  All other systems reviewed and negative.   EKGs/Labs/Other Studies Reviewed:    The following studies were reviewed today: PAP compliance download from [facility], outside labs   EKG:  EKG is not ordered today.  Recent Labs: 12/04/2019: ALT 21 12/07/2019: Magnesium 2.1 02/04/2020: B Natriuretic Peptide 49.8; BUN 20; Creatinine, Ser 0.97; Hemoglobin 13.2; Platelets 248; Potassium 4.0; Sodium 139   Recent Lipid Panel    Component Value Date/Time   CHOL 149 08/18/2018 0144  TRIG 174 (H) 08/18/2018 0144   HDL 36 (L) 08/18/2018 0144   CHOLHDL 4.1 08/18/2018 0144   VLDL 35 08/18/2018 0144   LDLCALC 78 08/18/2018 0144    Physical Exam:      VS:  BP (!) 145/87 (BP Location: Right Wrist, Patient Position: Sitting, Cuff Size: Large)   Pulse 78   Ht '6\' 2"'$  (1.88 m)   Wt (!) 432 lb (196 kg)   BMI 55.47 kg/m     Wt Readings from Last 3 Encounters:  03/20/20 (!) 432 lb (196 kg)  01/16/20 (!) 427 lb (193.7 kg)  01/02/20 (!) 427 lb (193.7 kg)     GEN: Well nourished, well developed in no acute distress.  Morbidly obese HEENT: Normal NECK: No JVD; No carotid bruits LYMPHATICS: No lymphadenopathy CARDIAC:RRR, no murmurs, rubs, gallops RESPIRATORY:  Clear to auscultation without rales, wheezing or rhonchi  ABDOMEN: Soft, non-tender, non-distended MUSCULOSKELETAL: chronic venous stasis changes with 2+ edema; No deformity  SKIN: Warm and dry NEUROLOGIC:  Alert and oriented x 3 PSYCHIATRIC:  Normal affect    ASSESSMENT:    1. SVT (supraventricular tachycardia) ([place])   2. Coronary artery disease involving native coronary artery of native heart without angina pectoris   3. Essential hypertension, benign   4. Morbid obesity ([place])   5. Pure hypercholesterolemia   6. Type 2 diabetes mellitus with vascular disease ([place])   7. Chronic diastolic CHF (congestive heart failure) ([facility])    PLAN:    In order of problems listed above:  1.  SVT  -followed by EP -palpitations have improved after BB increased to Toprol XL '75mg'$  BID -really no significant palpitations recently  2.  ASCAD -minimal coronary artery disease by cath 2019 -denies any anginal chest pain -continue ASA, BB and statin  3.  HTN -BP controlled on exam -continue prazosin '4mg'$  qhs and Toprol XL '75mg'$  BID -increase Lisinopril to '20mg'$  daily -outside labs reviewed on [facility] showing a Creatinine of 0.97 -check BMET in 1 week  4.  Morbid Obesity  -I have encouraged him to cut back on carbs and portions.   5.  HLD -LDL goal < 70 -LDL was 64 in May 2021 -continue atorvastatin '40mg'$  daily  6.  DM type 2 -followed by PCP -HbA1C was 8.3 % in April 2021 -continue  glipizide '5mg'$  BID and Metformin '1000mg'$  BID -continue statin and ACE I  7.  Chronic diastolic CHF -volume status if very difficult to assess in setting of morbid obesity -he has chronic LE edema from morbid obesity and chronic venous insuff but seems to have gotten worse.  Unclear whether this is related to worsening venous insuff or CHF.   -he is SOB and his weight is up since he was placed on steroid taper for COPD exacerbation in June>>suspect that some of the weight gain is related to his steroids -He had been taking Lasix '160mg'$  qam and '80mg'$  qpm but when he went in to ER for COPD exacerbation his diuretics appear to have been dropped erroneously to '80mg'$  BID.  Since ER hospital visit and says that he is putting out 6L of UOP a day and only takes in 1.5L but is holding on to fluid -SCR stable at 0.97 -Increase Lasix back to '160mg'$  qam and '80mg'$  qpm -check BMET in 1 week -followup with PA in 1 week and with me in 3 months   Medication Adjustments/Labs and Tests Ordered: Current medicines are reviewed at length with the patient today.  Concerns regarding  medicines are outlined above.  No orders of the defined types were placed in this encounter.  No orders of the defined types were placed in this encounter.   Signed, Fransico Him, MD  03/20/2020 8:27 AM    [place]

## 2020-03-20 ENCOUNTER — Other Ambulatory Visit: Payer: Self-pay

## 2020-03-20 ENCOUNTER — Ambulatory Visit (INDEPENDENT_AMBULATORY_CARE_PROVIDER_SITE_OTHER): Payer: Medicare Other | Admitting: Cardiology

## 2020-03-20 ENCOUNTER — Encounter: Payer: Self-pay | Admitting: Cardiology

## 2020-03-20 VITALS — BP 145/87 | HR 78 | Ht 74.0 in | Wt >= 6400 oz

## 2020-03-20 DIAGNOSIS — I2510 Atherosclerotic heart disease of native coronary artery without angina pectoris: Secondary | ICD-10-CM | POA: Diagnosis not present

## 2020-03-20 DIAGNOSIS — I10 Essential (primary) hypertension: Secondary | ICD-10-CM | POA: Diagnosis not present

## 2020-03-20 DIAGNOSIS — I5032 Chronic diastolic (congestive) heart failure: Secondary | ICD-10-CM

## 2020-03-20 DIAGNOSIS — I471 Supraventricular tachycardia, unspecified: Secondary | ICD-10-CM

## 2020-03-20 DIAGNOSIS — E1159 Type 2 diabetes mellitus with other circulatory complications: Secondary | ICD-10-CM

## 2020-03-20 DIAGNOSIS — E7800 Pure hypercholesterolemia, unspecified: Secondary | ICD-10-CM

## 2020-03-20 MED ORDER — LISINOPRIL 20 MG PO TABS
20.0000 mg | ORAL_TABLET | Freq: Every day | ORAL | 3 refills | Status: DC
Start: 2020-03-20 — End: 2020-10-26

## 2020-03-20 MED ORDER — FUROSEMIDE 80 MG PO TABS
ORAL_TABLET | ORAL | 3 refills | Status: DC
Start: 2020-03-20 — End: 2020-10-26

## 2020-03-20 NOTE — Addendum Note (Signed)
Addended by: Antonieta Iba on: 03/20/2020 08:48 AM   Modules accepted: Orders

## 2020-03-20 NOTE — Patient Instructions (Signed)
Medication Instructions:  Your physician has recommended you make the following change in your medication:  1) INCREASE Lasix (furosemide) to 160 mg every morning and 80 mg every evening.  2) INCREASE Lisinopril to 20 mg daily  *If you need a refill on your cardiac medications before your next appointment, please call your pharmacy*   Lab Work: BMET on 04/01/20 If you have labs (blood work) drawn today and your tests are completely normal, you will receive your results only by: [name] MyChart Message (if you have MyChart) OR . A paper copy in the mail If you have any lab test that is abnormal or we need to change your treatment, we will call you to review the results.  Follow-Up: At [facility], you and your health needs are our priority.  As part of our continuing mission to provide you with exceptional heart care, we have created designated Provider Care Teams.  These Care Teams include your primary Cardiologist (physician) and Advanced Practice Providers (APPs -  Physician Assistants and Nurse Practitioners) who all work together to provide you with the care you need, when you need it.   Your next appointment:   1 week(s)  The format for your next appointment:   In Person  Provider:   Ermalinda Barrios, PA-C  Follow up in 3 months with Dr. Radford Pax

## 2020-03-23 ENCOUNTER — Telehealth: Payer: Self-pay | Admitting: Physician Assistant

## 2020-03-23 MED ORDER — METOPROLOL SUCCINATE ER 25 MG PO TB24: 75.0000 mg | ORAL_TABLET | Freq: Two times a day (BID) | ORAL | 4 refills | Status: DC

## 2020-03-23 NOTE — Telephone Encounter (Signed)
Pt's medication was sent to pt's pharmacy as requested. Confirmation received.  °

## 2020-03-23 NOTE — Telephone Encounter (Signed)
*  STAT* If patient is at the pharmacy, call can be transferred to refill team.   1. Which medications need to be refilled? (please list name of each medication and dose if known) metoprolol succinate (TOPROL-XL) 25 MG 24 hr tablet  2. Which pharmacy/location (including street and city if local pharmacy) is medication to be sent to? [facility] - [place], [place] - [address]  3. Do they need a 30 day or 90 day supply? 90  

## 2020-03-30 NOTE — Progress Notes (Signed)
Cardiology Office Note    Date:  04/01/2020   ID:  Lawrence [name], DOB 12-Apr-1956, MRN [id number]  PCP:  Jenny Reichmann, PA-C  Cardiologist: Fransico Him, MD EPS: [name], MD  Chief Complaint  Patient presents with  . Follow-up    History of Present Illness:  Lawrence [name] is a 64 y.o. male with a hx of nonobstructive coronary artery disease, hypertension, hyperlipidemia, diastolic heart failure, OSA on BiPAP, super morbid obesity, depression, anxiety.  He is a DNR.  He was seen in the hospital 01/25/2018 for evaluation of chest pain and shortness of breath.  He was thought to be a poor candidate for ischemic evaluation due to his obesity.  Troponins were negative at that time.  Left heart catheterization was subsequently performed which showed minimal coronary artery disease.     He presented the emergency room 02/19/2019 with palpitations.  No arrhythmias were noted.  He wore a ZIO patch which showed multiple episodes of SVT. He was seen by EP and Toprol was increased to 75m BID.  Dyspnea on exertion due to super morbid obesity as well as chronic lower extremity edema and chronic venous insufficiency for which he needs surgery but insurance refused to pay until he uses compressions hose for 3 months.  Patient saw Dr. TRadford Pax8/13/2021 complaining of dull achiness in his chest that is nonexertional and occurs about once a week and last a few days at a time constant.  She increased lisinopril to 20 mg daily for blood pressure and encourage weight loss.  Lasix was increased to 160 mg in the morning 80 in the evening after it was erroneously dropped back to 80 mg twice daily after an ER visit.   Patient comes in for f/u. Overall doing better but sleeping more and worn out by the afternoon. Has been going on for awhile but seems to be getting worse. Not walking at all because of vein problem.     Past Medical History:  Diagnosis Date  . Acute on chronic diastolic CHF  (congestive heart failure) ([place] 01/13/2018  . Acute respiratory failure with hypoxia ([place] 01/13/2018  . Adenomatous colon polyp 2007  . Anasarca 01/31/2017  . Anemia   . Anxiety   . Arthritis   . Asthma   . Bilateral lower extremity edema 01/31/2017  . Bilateral lower leg cellulitis   . BPH without obstruction/lower urinary tract symptoms 02/22/2017  . Cellulitis 11/07/2017  . Cellulitis of lower extremity   . Chest pain   . Chronic cough   . Chronic diastolic CHF (congestive heart failure) ([facility]   . Chronic venous stasis 03/07/2019  . COPD (chronic obstructive pulmonary disease) ([facility]   . Coronary artery calcification seen on CAT scan   . Depression   . Diabetic neuropathy ([place] 09/11/2019  . Hematuria 02/22/2017  . History of anemia 12/26/2018  . History of cardiac cath    a.  01/2018 with angiographically minimal coronary disease with  up to 40% prox LAD and 20% mid LCx and OM2 with normal LVF and normal LVEDP..  . History of colon polyps 08/24/2018  . Hypertension   . Leukocytosis 08/18/2018  . Morbid obesity ([facility]   . New onset type 2 diabetes mellitus ([facility] [place] 11/08/2017  . OSA (obstructive sleep apnea)   . Pain due to onychomycosis of toenails of both feet 09/11/2019  . Peripheral neuropathy 02/22/2017  . Physical deconditioning 02/22/2017  . Primary osteoarthritis, left shoulder 03/05/2017  . PTSD (post-traumatic stress disorder)   .  Pure hypercholesterolemia   . QT prolongation 03/07/2019  . Right foot pain 01/13/2018  . Shortness of breath   . Sinus tachycardia 03/07/2019  . Sleep apnea    CPAP  . Type 2 diabetes mellitus with vascular disease ([place]) 09/11/2019    Past Surgical History:  Procedure Laterality Date  . JOINT REPLACEMENT     left knee replacement x 2  . KNEE ARTHROSCOPY Bilateral   . LEFT HEART CATH AND CORONARY ANGIOGRAPHY N/A 01/17/2018   Procedure: LEFT HEART CATH AND CORONARY ANGIOGRAPHY;  Surgeon: Leonie Man, MD;  Location: [facility];  Service:  Cardiovascular;  Laterality: N/A;  . UMBILICAL HERNIA REPAIR      Current Medications: Current Meds  Medication Sig  . acetaminophen (TYLENOL) 650 MG CR tablet Take 1,300 mg by mouth every 8 (eight) hours as needed for pain.  [name] albuterol (VENTOLIN HFA) 108 (90 Base) MCG/ACT inhaler Inhale into the lungs.  [name] allopurinol (ZYLOPRIM) 100 MG tablet Take 1 tablet (100 mg total) by mouth daily. (Patient taking differently: Take 200 mg by mouth daily. )  . ARIPiprazole (ABILIFY) 5 MG tablet Take 5 mg by mouth at bedtime.   [name] aspirin EC 81 MG EC tablet Take 1 tablet (81 mg total) by mouth daily.  [name] atorvastatin (LIPITOR) 40 MG tablet Take 1 tablet (40 mg total) by mouth daily at 6 PM.  . blood glucose meter kit and supplies Dispense based on patient and insurance preference. Use up to four times daily as directed. (FOR ICD-10 E10.9, E11.9).  . cetirizine (ZYRTEC) 10 MG tablet Take 10 mg by mouth at bedtime.   . colchicine 0.6 MG tablet Take 0.6 mg by mouth as needed (gout).   . Dulaglutide (TRULICITY) 1.5 TW/6.5KC SOPN Inject into the skin once a week. On Monday  . ferrous sulfate 324 (65 Fe) MG TBEC Take 324 mg by mouth daily with breakfast.   . fluticasone (FLONASE) 50 MCG/ACT nasal spray Place 2 sprays into both nostrils daily.   . furosemide (LASIX) 80 MG tablet Take 2 tablets (160 mg total) every morning and 1 tablet (80 mg total) every evening.  [name] lisinopril (ZESTRIL) 20 MG tablet Take 1 tablet (20 mg total) by mouth daily.  [name] loperamide (IMODIUM) 2 MG capsule Take 2 mg by mouth 2 (two) times a day.   . meloxicam (MOBIC) 15 MG tablet Take 15 mg by mouth daily.  . meloxicam (MOBIC) 7.5 MG tablet Take 7.5 mg by mouth daily as needed for pain.  . metFORMIN (GLUCOPHAGE) 1000 MG tablet Take 1,000 mg by mouth 2 (two) times daily with a meal.  . metoprolol succinate (TOPROL-XL) 25 MG 24 hr tablet Take 3 tablets (75 mg total) by mouth 2 (two) times daily. Take with or immediately following a meal.  .  prazosin (MINIPRESS) 2 MG capsule Take 4 mg by mouth at bedtime.  . pregabalin (LYRICA) 100 MG capsule Take 100 mg by mouth in the morning, at noon, and at bedtime.   . propranolol (INDERAL) 10 MG tablet TAKE ONE TABLET BY MOUTH AS NEEDED  . silver sulfADIAZINE (SILVADENE) 1 % cream Apply 1 application topically See admin instructions. Apply as directed once a day as directed to bilateral legs  . temazepam (RESTORIL) 15 MG capsule Take 15 mg by mouth at bedtime.   . traZODone (DESYREL) 100 MG tablet Take 200 mg by mouth at bedtime.  Loura Pardon Salicylate (ASPERCREME EX) Apply 1 application topically 2 (two) times  daily as needed (pain and inflammation).  . venlafaxine XR (EFFEXOR-XR) 150 MG 24 hr capsule Take 150 mg by mouth daily with breakfast.  . Vitamin D, Ergocalciferol, (DRISDOL) 1.25 MG (50000 UNIT) CAPS capsule Take 50,000 Units by mouth every 7 (seven) days.     Allergies:   Vancomycin, Tubersol [tuberculin], and Doxycycline   Social History   Socioeconomic History  . Marital status: Single    Spouse name: Not on file  . Number of children: 1  . Years of education: Not on file  . Highest education level: Not on file  Occupational History  . Occupation: retired  Tobacco Use  . Smoking status: Former Smoker    Quit date: 03/07/2016    Years since quitting: 4.0  . Smokeless tobacco: Never Used  Substance and Sexual Activity  . Alcohol use: Not Currently    Comment: rare  . Drug use: No  . Sexual activity: Not on file  Other Topics Concern  . Not on file  Social History Narrative  . Not on file   Social Determinants of Health   Financial Resource Strain:   . Difficulty of Paying Living Expenses: Not on file  Food Insecurity:   . Worried About Charity fundraiser in the Last Year: Not on file  . Ran Out of Food in the Last Year: Not on file  Transportation Needs:   . Lack of Transportation (Medical): Not on file  . Lack of Transportation (Non-Medical): Not on file    Physical Activity:   . Days of Exercise per Week: Not on file  . Minutes of Exercise per Session: Not on file  Stress:   . Feeling of Stress : Not on file  Social Connections:   . Frequency of Communication with Friends and Family: Not on file  . Frequency of Social Gatherings with Friends and Family: Not on file  . Attends Religious Services: Not on file  . Active Member of Clubs or Organizations: Not on file  . Attends Archivist Meetings: Not on file  . Marital Status: Not on file     Family History:  The patient's family history includes CAD in his maternal grandfather; Diabetes in an other family member.   ROS:   Please see the history of present illness.    ROS All other systems reviewed and are negative.   PHYSICAL EXAM:   VS:  BP 118/74   Pulse 86   Ht _0  (1.88 m)   Wt (!) 427 lb (193.7 kg)   SpO2 92%   BMI 54.82 kg/m   Physical Exam  GEN: Obese in no acute distress  Neck: no JVD, carotid bruits, or masses Cardiac:RRR; no murmurs, rubs, or gallops  Respiratory:  clear to auscultation bilaterally, normal work of breathing GI: soft, nontender, nondistended, + BS VQQ:VZDG 3 brawny edema bilaterally Neuro:  Alert and Oriented x 3 Psych: euthymic mood, full affect  Wt Readings from Last 3 Encounters:  04/01/20 (!) 427 lb (193.7 kg)  03/20/20 (!) 432 lb (196 kg)  01/16/20 (!) 427 lb (193.7 kg)      Studies/Labs Reviewed:   EKG:  EKG is not ordered today.    Recent Labs: 12/04/2019: ALT 21 12/07/2019: Magnesium 2.1 02/04/2020: B Natriuretic Peptide 49.8; BUN 20; Creatinine, Ser 0.97; Hemoglobin 13.2; Platelets 248; Potassium 4.0; Sodium 139   Lipid Panel    Component Value Date/Time   CHOL 149 08/18/2018 0144   TRIG 174 (H) 08/18/2018 0144  HDL 36 (L) 08/18/2018 0144   CHOLHDL 4.1 08/18/2018 0144   VLDL 35 08/18/2018 0144   LDLCALC 78 08/18/2018 0144    Additional studies/ records that were reviewed today include:    Monitor  03/2019 Study Highlights  Max 200 bpm 09:28pm, 07/28 Min 66 bpm 08:09am, 07/28 Avg 92 bpm Multiple episodes of SVT Fastest episode at 200 BPM for 14 beats Longest episode at 15 minutes with average HR of 137 bpm Symptoms of chest pain associated with sinus rhythm Artifact makes interpretation difficult   Will Camnitz, MD    ASSESSMENT:    1. Chronic diastolic CHF (congestive heart failure) ([place])   2. SVT (supraventricular tachycardia) ([place])   3. Coronary artery disease involving native coronary artery of native heart without angina pectoris   4. Essential hypertension   5. Hyperlipidemia, unspecified hyperlipidemia type   6. Type 2 diabetes mellitus with vascular disease ([place])   7. Morbid obesity ([place])      PLAN:  In order of problems listed above:  Chronic diastolic CHF with chronic lower extremity edema morbid obesity and chronic venous insufficiency making it difficult to assess.  Dr. Radford Pax increased his Lasix to 160 mg the morning 80 in the evening after it was inadvertently cut back in the emergency room. Overall doing better and weight down 5 lbs. Main complaint is fatigue. Will add TSH to labs  SVT followed by EP Toprol increased to 75 mg twice daily  CAD minimal on cath in 2019  Hypertension BP better on higher dose lisinopril  Hyperlipidemia LDL 64 12/2018 on atorvastatin 40 mg daily  Morbid obesity refer to weight loss center.   DM type II on statin managed by PCP    Medication Adjustments/Labs and Tests Ordered: Current medicines are reviewed at length with the patient today.  Concerns regarding medicines are outlined above.  Medication changes, Labs and Tests ordered today are listed in the Patient Instructions below. Patient Instructions  Medication Instructions:  Your physician recommends that you continue on your current medications as directed. Please refer to the Current Medication list given to you today.  *If you need a refill on your cardiac  medications before your next appointment, please call your pharmacy*   Lab Work: TODAY: TSH  If you have labs (blood work) drawn today and your tests are completely normal, you will receive your results only by: [name] MyChart Message (if you have MyChart) OR . A paper copy in the mail If you have any lab test that is abnormal or we need to change your treatment, we will call you to review the results.   Testing/Procedures: None   Follow-Up: At [facility], you and your health needs are our priority.  As part of our continuing mission to provide you with exceptional heart care, we have created designated Provider Care Teams.  These Care Teams include your primary Cardiologist (physician) and Advanced Practice Providers (APPs -  Physician Assistants and Nurse Practitioners) who all work together to provide you with the care you need, when you need it.  We recommend signing up for the patient portal called "MyChart".  Sign up information is provided on this After Visit Summary.  MyChart is used to connect with patients for Virtual Visits (Telemedicine).  Patients are able to view lab/test results, encounter notes, upcoming appointments, etc.  Non-urgent messages can be sent to your provider as well.   To learn more about what you can do with MyChart, go to [email or web address].  Your next appointment:   06/24/2020  The format for your next appointment:   In Person  Provider:   Fransico Him, MD   Other Instructions You have been referred to [facility]. They will contact you to schedule appointment.      Sumner Boast, PA-C  04/01/2020 10:42 AM    [facility] [address], [place], [place]  [address] Phone: [phone]; Fax: [phone]

## 2020-04-01 ENCOUNTER — Other Ambulatory Visit: Payer: Medicare Other | Admitting: *Deleted

## 2020-04-01 ENCOUNTER — Ambulatory Visit (INDEPENDENT_AMBULATORY_CARE_PROVIDER_SITE_OTHER): Payer: Medicare Other | Admitting: Physician Assistant

## 2020-04-01 ENCOUNTER — Other Ambulatory Visit: Payer: Self-pay

## 2020-04-01 ENCOUNTER — Encounter: Payer: Self-pay | Admitting: Physician Assistant

## 2020-04-01 VITALS — BP 118/74 | HR 86 | Ht 74.0 in | Wt >= 6400 oz

## 2020-04-01 DIAGNOSIS — I471 Supraventricular tachycardia, unspecified: Secondary | ICD-10-CM

## 2020-04-01 DIAGNOSIS — E1159 Type 2 diabetes mellitus with other circulatory complications: Secondary | ICD-10-CM

## 2020-04-01 DIAGNOSIS — I2510 Atherosclerotic heart disease of native coronary artery without angina pectoris: Secondary | ICD-10-CM

## 2020-04-01 DIAGNOSIS — I10 Essential (primary) hypertension: Secondary | ICD-10-CM

## 2020-04-01 DIAGNOSIS — I5032 Chronic diastolic (congestive) heart failure: Secondary | ICD-10-CM

## 2020-04-01 DIAGNOSIS — E7800 Pure hypercholesterolemia, unspecified: Secondary | ICD-10-CM

## 2020-04-01 DIAGNOSIS — E785 Hyperlipidemia, unspecified: Secondary | ICD-10-CM

## 2020-04-01 LAB — BASIC METABOLIC PANEL
BUN/Creatinine Ratio: 27 — ABNORMAL HIGH (ref 10–24)
BUN: 23 mg/dL (ref 8–27)
CO2: 26 mmol/L (ref 20–29)
Calcium: 9.3 mg/dL (ref 8.6–10.2)
Chloride: 95 mmol/L — ABNORMAL LOW (ref 96–106)
Creatinine, Ser: 0.86 mg/dL (ref 0.76–1.27)
GFR calc Af Amer: 107 mL/min/{1.73_m2} (ref >59)
GFR calc non Af Amer: 92 mL/min/{1.73_m2} (ref >59)
Glucose: 215 mg/dL — ABNORMAL HIGH (ref 65–99)
Potassium: 4.3 mmol/L (ref 3.5–5.2)
Sodium: 136 mmol/L (ref 134–144)

## 2020-04-01 LAB — TSH: TSH: 1.69 u[IU]/mL (ref 0.450–4.500)

## 2020-04-01 NOTE — Patient Instructions (Signed)
Medication Instructions:  Your physician recommends that you continue on your current medications as directed. Please refer to the Current Medication list given to you today.  *If you need a refill on your cardiac medications before your next appointment, please call your pharmacy*   Lab Work: TODAY: TSH  If you have labs (blood work) drawn today and your tests are completely normal, you will receive your results only by: [name] MyChart Message (if you have MyChart) OR . A paper copy in the mail If you have any lab test that is abnormal or we need to change your treatment, we will call you to review the results.   Testing/Procedures: None   Follow-Up: At [facility], you and your health needs are our priority.  As part of our continuing mission to provide you with exceptional heart care, we have created designated Provider Care Teams.  These Care Teams include your primary Cardiologist (physician) and Advanced Practice Providers (APPs -  Physician Assistants and Nurse Practitioners) who all work together to provide you with the care you need, when you need it.  We recommend signing up for the patient portal called "MyChart".  Sign up information is provided on this After Visit Summary.  MyChart is used to connect with patients for Virtual Visits (Telemedicine).  Patients are able to view lab/test results, encounter notes, upcoming appointments, etc.  Non-urgent messages can be sent to your provider as well.   To learn more about what you can do with MyChart, go to [email or web address].    Your next appointment:   06/24/2020  The format for your next appointment:   In Person  Provider:   Fransico Him, MD   Other Instructions You have been referred to [facility]. They will contact you to schedule appointment.

## 2020-04-23 ENCOUNTER — Ambulatory Visit: Payer: Medicare Other | Admitting: Vascular Surgery

## 2020-04-23 ENCOUNTER — Encounter (HOSPITAL_COMMUNITY): Payer: Medicare Other

## 2020-05-15 NOTE — Telephone Encounter (Signed)
Pt reports having to take PRN propranolol daily for the last 3/4 days for elevated HRs. States that yesterday HR went up to 150s, this caused concern for him.  Resting HRs in 90s. Events are lasting about 2-3 hours each.  Experiencing CP w/ elevated heart rates and SOB but thinks this is more r/t to his COPD. Denies dizziness, weight gain/edema Asked pt if he had any stressors -- he states that his wife's brother has CA so that has been weighing on the family. Scheduled him with EP PA for Wednesday to further discuss, aware he is also due for routing follow up.   Advised to call office if HRs continue to be elevated w/ medication and/or symptoms begin/worsen, otherwise go to ED if needed. Patient verbalized understanding and agreeable to plan.

## 2020-05-20 ENCOUNTER — Other Ambulatory Visit: Payer: Self-pay

## 2020-05-20 ENCOUNTER — Encounter: Payer: Self-pay | Admitting: Student

## 2020-05-20 ENCOUNTER — Ambulatory Visit (INDEPENDENT_AMBULATORY_CARE_PROVIDER_SITE_OTHER): Payer: Medicare Other | Admitting: Student

## 2020-05-20 VITALS — BP 116/72 | HR 89 | Ht 74.0 in | Wt >= 6400 oz

## 2020-05-20 DIAGNOSIS — I471 Supraventricular tachycardia, unspecified: Secondary | ICD-10-CM

## 2020-05-20 DIAGNOSIS — I10 Essential (primary) hypertension: Secondary | ICD-10-CM | POA: Diagnosis not present

## 2020-05-20 DIAGNOSIS — R002 Palpitations: Secondary | ICD-10-CM

## 2020-05-20 DIAGNOSIS — I5032 Chronic diastolic (congestive) heart failure: Secondary | ICD-10-CM

## 2020-05-20 LAB — MAGNESIUM: Magnesium: 2.3 mg/dL (ref 1.6–2.3)

## 2020-05-20 LAB — BASIC METABOLIC PANEL
BUN/Creatinine Ratio: 28 — ABNORMAL HIGH (ref 10–24)
BUN: 33 mg/dL — ABNORMAL HIGH (ref 8–27)
CO2: 30 mmol/L — ABNORMAL HIGH (ref 20–29)
Calcium: 9.7 mg/dL (ref 8.6–10.2)
Chloride: 93 mmol/L — ABNORMAL LOW (ref 96–106)
Creatinine, Ser: 1.16 mg/dL (ref 0.76–1.27)
GFR calc Af Amer: 77 mL/min/{1.73_m2} (ref >59)
GFR calc non Af Amer: 67 mL/min/{1.73_m2} (ref >59)
Glucose: 178 mg/dL — ABNORMAL HIGH (ref 65–99)
Potassium: 4.8 mmol/L (ref 3.5–5.2)
Sodium: 135 mmol/L (ref 134–144)

## 2020-05-20 MED ORDER — SPIRONOLACTONE 25 MG PO TABS
25.0000 mg | ORAL_TABLET | Freq: Every day | ORAL | 3 refills | Status: DC
Start: 2020-05-20 — End: 2020-05-20

## 2020-05-20 MED ORDER — SPIRONOLACTONE 25 MG PO TABS: 25.0000 mg | ORAL_TABLET | Freq: Every day | ORAL | 0 refills | Status: DC

## 2020-05-20 MED ORDER — METOPROLOL SUCCINATE ER 100 MG PO TB24
100.0000 mg | ORAL_TABLET | Freq: Two times a day (BID) | ORAL | 3 refills | Status: DC
Start: 2020-05-20 — End: 2020-10-26

## 2020-05-20 NOTE — Progress Notes (Addendum)
PCP:  Jenny Reichmann, PA-C Primary Cardiologist: Fransico Him, MD Electrophysiologist: Constance Haw, MD   Lawrence [name] is a 64 y.o. male seen today for [name], MD for acute visit due to increased palpitations.  Since last being seen in our clinic the patient reports doing OK overall. Last week he had 3 days in a row where he had tachy-palpitations and chest discomfort. His apple watch showed HRs ~ 150 bpm. Prior to this episode he most recently had one, he had about 2 weeks prior to that. He denies exertional chest pain. He has DOE chronically. Not very active. He does have orthopnea as well. He feels like his peripheral edema is currently stable.   Past Medical History:  Diagnosis Date  . Acute on chronic diastolic CHF (congestive heart failure) ([place]) 01/13/2018  . Acute respiratory failure with hypoxia ([place]) 01/13/2018  . Adenomatous colon polyp 2007  . Anasarca 01/31/2017  . Anemia   . Anxiety   . Arthritis   . Asthma   . Bilateral lower extremity edema 01/31/2017  . Bilateral lower leg cellulitis   . BPH without obstruction/lower urinary tract symptoms 02/22/2017  . Cellulitis 11/07/2017  . Cellulitis of lower extremity   . Chest pain   . Chronic cough   . Chronic diastolic CHF (congestive heart failure) ([place])   . Chronic venous stasis 03/07/2019  . COPD (chronic obstructive pulmonary disease) ([place])   . Coronary artery calcification seen on CAT scan   . Depression   . Diabetic neuropathy ([place]) 09/11/2019  . Hematuria 02/22/2017  . History of anemia 12/26/2018  . History of cardiac cath    a.  01/2018 with angiographically minimal coronary disease with  up to 40% prox LAD and 20% mid LCx and OM2 with normal LVF and normal LVEDP..  . History of colon polyps 08/24/2018  . Hypertension   . Leukocytosis 08/18/2018  . Morbid obesity ([place])   . New onset type 2 diabetes mellitus ([place]) 11/08/2017  . OSA (obstructive sleep apnea)   . Pain due to onychomycosis of toenails  of both feet 09/11/2019  . Peripheral neuropathy 02/22/2017  . Physical deconditioning 02/22/2017  . Primary osteoarthritis, left shoulder 03/05/2017  . PTSD (post-traumatic stress disorder)   . Pure hypercholesterolemia   . QT prolongation 03/07/2019  . Right foot pain 01/13/2018  . Shortness of breath   . Sinus tachycardia 03/07/2019  . Sleep apnea    CPAP  . Type 2 diabetes mellitus with vascular disease ([place]) 09/11/2019   Past Surgical History:  Procedure Laterality Date  . JOINT REPLACEMENT     left knee replacement x 2  . KNEE ARTHROSCOPY Bilateral   . LEFT HEART CATH AND CORONARY ANGIOGRAPHY N/A 01/17/2018   Procedure: LEFT HEART CATH AND CORONARY ANGIOGRAPHY;  Surgeon: Leonie Man, MD;  Location: [facility];  Service: Cardiovascular;  Laterality: N/A;  . UMBILICAL HERNIA REPAIR      Current Outpatient Medications  Medication Sig Dispense Refill  . acetaminophen (TYLENOL) 650 MG CR tablet Take 1,300 mg by mouth every 8 (eight) hours as needed for pain.    [name] albuterol (VENTOLIN HFA) 108 (90 Base) MCG/ACT inhaler Inhale into the lungs.    [name] allopurinol (ZYLOPRIM) 100 MG tablet Take 200 mg by mouth daily.    . ARIPiprazole (ABILIFY) 5 MG tablet Take 5 mg by mouth at bedtime.     [name] aspirin EC 81 MG EC tablet Take 1 tablet (81 mg  total) by mouth daily.    [name] atorvastatin (LIPITOR) 40 MG tablet Take 1 tablet (40 mg total) by mouth daily at 6 PM. 90 tablet 3  . blood glucose meter kit and supplies Dispense based on patient and insurance preference. Use up to four times daily as directed. (FOR ICD-10 E10.9, E11.9). 1 each 0  . cetirizine (ZYRTEC) 10 MG tablet Take 10 mg by mouth at bedtime.     . colchicine 0.6 MG tablet Take 0.6 mg by mouth as needed (gout).     . Dulaglutide (TRULICITY) 3 FW/2.6VZ SOPN Inject 3 mg into the skin once a week.    . ferrous sulfate 324 (65 Fe) MG TBEC Take 324 mg by mouth daily with breakfast.     . fluticasone (FLONASE) 50 MCG/ACT nasal spray Place  2 sprays into both nostrils daily.     . furosemide (LASIX) 80 MG tablet Take 2 tablets (160 mg total) every morning and 1 tablet (80 mg total) every evening. 270 tablet 3  . lisinopril (ZESTRIL) 20 MG tablet Take 1 tablet (20 mg total) by mouth daily. 90 tablet 3  . loperamide (IMODIUM) 2 MG capsule Take 2 mg by mouth 2 (two) times a day.     . meloxicam (MOBIC) 15 MG tablet Take 15 mg by mouth daily.    . metFORMIN (GLUCOPHAGE) 1000 MG tablet Take 1,000 mg by mouth 2 (two) times daily with a meal.    . metoprolol succinate (TOPROL-XL) 25 MG 24 hr tablet Take 3 tablets (75 mg total) by mouth 2 (two) times daily. Take with or immediately following a meal. 180 tablet 4  . prazosin (MINIPRESS) 2 MG capsule Take 4 mg by mouth at bedtime.    . pregabalin (LYRICA) 100 MG capsule Take 100 mg by mouth in the morning, at noon, and at bedtime.     . propranolol (INDERAL) 10 MG tablet TAKE ONE TABLET BY MOUTH AS NEEDED 10 tablet 10  . silver sulfADIAZINE (SILVADENE) 1 % cream Apply 1 application topically See admin instructions. Apply as directed once a day as directed to bilateral legs    . temazepam (RESTORIL) 15 MG capsule Take 15 mg by mouth at bedtime.     . traZODone (DESYREL) 100 MG tablet Take 200 mg by mouth at bedtime.    Loura Pardon Salicylate (ASPERCREME EX) Apply 1 application topically 2 (two) times daily as needed (pain and inflammation).    . venlafaxine XR (EFFEXOR-XR) 150 MG 24 hr capsule Take 150 mg by mouth daily with breakfast.    . Vitamin D, Ergocalciferol, (DRISDOL) 1.25 MG (50000 UNIT) CAPS capsule Take 50,000 Units by mouth every 7 (seven) days.     No current facility-administered medications for this visit.    Allergies  Allergen Reactions  . Vancomycin Other (See Comments)    "Red Man Syndrome"  . Tubersol [Tuberculin] Other (See Comments)    Reaction unknown  . Doxycycline Rash    Social History   Socioeconomic History  . Marital status: Single    Spouse name: Not  on file  . Number of children: 1  . Years of education: Not on file  . Highest education level: Not on file  Occupational History  . Occupation: retired  Tobacco Use  . Smoking status: Former Smoker    Quit date: 03/07/2016    Years since quitting: 4.2  . Smokeless tobacco: Never Used  Substance and Sexual Activity  . Alcohol use: Not Currently  Comment: rare  . Drug use: No  . Sexual activity: Not on file  Other Topics Concern  . Not on file  Social History Narrative  . Not on file   Social Determinants of Health   Financial Resource Strain:   . Difficulty of Paying Living Expenses: Not on file  Food Insecurity:   . Worried About Programme researcher, broadcasting/film/video in the Last Year: Not on file  . Ran Out of Food in the Last Year: Not on file  Transportation Needs:   . Lack of Transportation (Medical): Not on file  . Lack of Transportation (Non-Medical): Not on file  Physical Activity:   . Days of Exercise per Week: Not on file  . Minutes of Exercise per Session: Not on file  Stress:   . Feeling of Stress : Not on file  Social Connections:   . Frequency of Communication with Friends and Family: Not on file  . Frequency of Social Gatherings with Friends and Family: Not on file  . Attends Religious Services: Not on file  . Active Member of Clubs or Organizations: Not on file  . Attends Banker Meetings: Not on file  . Marital Status: Not on file  Intimate Partner Violence:   . Fear of Current or Ex-Partner: Not on file  . Emotionally Abused: Not on file  . Physically Abused: Not on file  . Sexually Abused: Not on file     Review of Systems: General: No chills, fever, night sweats or weight changes  Cardiovascular:  No chest pain, dyspnea on exertion, edema, orthopnea, palpitations, paroxysmal nocturnal dyspnea Dermatological: No rash, lesions or masses Respiratory: No cough, dyspnea Urologic: No hematuria, dysuria Abdominal: No nausea, vomiting, diarrhea, bright  red blood per rectum, melena, or hematemesis Neurologic: No visual changes, weakness, changes in mental status All other systems reviewed and are otherwise negative except as noted above.  Physical Exam: Vitals:   05/20/20 0932  BP: 116/72  Pulse: 89  SpO2: 91%  Weight: (!) 430 lb (195 kg)  Height: 6\' 2"  (1.88 m)    GEN- The patient is well appearing, alert and oriented x 3 today.   HEENT: normocephalic, atraumatic; sclera clear, conjunctiva pink; hearing intact; oropharynx clear; neck supple, no JVP Lymph- no cervical lymphadenopathy Lungs- Clear to ausculation bilaterally, normal work of breathing.  No wheezes, rales, rhonchi Heart- Regular rate and rhythm, no murmurs, rubs or gallops, PMI not laterally displaced GI- soft, non-tender, non-distended, bowel sounds present, no hepatosplenomegaly Extremities- no clubbing, cyanosis, or edema; DP/PT/radial pulses 2+ bilaterally MS- no significant deformity or atrophy Skin- warm and dry, no rash or lesion Psych- euthymic mood, full affect Neuro- strength and sensation are intact  EKG is ordered. Personal review of EKG from today shows NSR at 89 bpm, QRS 104 ms, PR interval 220 ms.  Additional studies reviewed include: [facility] 02/28/19  Max 200 bpm 09:28pm, 07/28 Min 66 bpm 08:09am, 07/28 Avg 92 bpm Multiple episodes of SVT Fastest episode at 200 BPM for 14 beats Longest episode at 15 minutes with average HR of 137 bpm Symptoms of chest pain associated with sinus rhythm Artifact makes interpretation difficult   TTE 03/08/19 1. The left ventricle has normal systolic function, with an ejection fraction of 55-60%. The cavity size was normal. There is moderate concentric left ventricular hypertrophy. Left ventricular diastolic Doppler parameters are consistent with  pseudonormalization. No evidence of left ventricular regional wall motion abnormalities. 2. The right ventricle has normal systolic function. The cavity  was normal. There is  no increase in right ventricular wall thickness. 3. Left atrial size was mildly dilated. 4. Right atrial size was mildly dilated. 5. Mild thickening of the mitral valve leaflet. Mild calcification of the mitral valve leaflet. There is moderate to severe mitral annular calcification present. 6. The aortic valve is tricuspid. Moderate thickening of the aortic valve. Moderate calcification of the aortic valve. Mild stenosis of the aortic valve. 7. The aorta is normal in size and structure. 8. The aortic root is normal in size and structure.   01/17/2018: LHC  There is hyperdynamic left ventricular systolic function.  LV end diastolic pressure is normal.  The left ventricular ejection fraction is greater than 65% by visual estimate.  There is no mitral valve regurgitation.  There is no aortic valve stenosis.  Prox LAD to Mid LAD lesion is 40% stenosed.  Prox Cx to Mid Cx lesion is 20% stenosed.  Ost 2nd Mrg lesion is 20% stenosed.  Assessment and Plan:  1. SVT Increased symptoms last week, but overall has symptoms about once a month on toprol and prn propranolol.  Increase toprol to 100 mg BID.  Continue propranolol prn.  Labs today.   2. HTN Meds as above.   3. Chronic CHF (diastolic) Echo 03/5461 LVEF 55-60% Volume status appears OK on exam. Continue lasix 160 mg q am and 80 mg q pm. Would consider change to torsemide.  BMET today.  Discussed fluid and sodium restriction. He states he is now drinking < 2L a day.  Will add spironolactone with its positive data in diastolic CHF patients.  His recent Cr and K have been appropriate for it's use. Will f/u BMET in 10-14 days to follow after starting.   4. Morbid Obesity Body mass index is 55.21 kg/m.  Increase weight loss with increased activity in any way he can and diet. Encouraged healthy food choices and portion control.   Shirley Friar, PA-C  05/20/20 9:52 AM

## 2020-05-20 NOTE — Patient Instructions (Addendum)
Medication Instructions:  Your physician has recommended you make the following change in your medication:  -- INCREASE Metoprolol Succinate (Toprol XL) to 100 mg - Take 1 tablet (100 mg) by mouth TWICE daily -- NEW RX SENT -- START Spironolactone (Aldactone) 25 mg - Take 1 tablet (25 mg) by mouth ONCE daily -- NEW RX SENT *If you need a refill on your cardiac medications before your next appointment, please call your pharmacy*  Lab Work: Your physician has recommended that you have lab work today: BMET and Magnesium Level Your physician recommends that you have REPEAT lab work in: 2 [place]; week of 10/25  (BMET) -- Please have results faxed to Arvilla Meres, PA-C at [phone] -- [place]  If you have labs (blood work) drawn today and your tests are completely normal, you will receive your results only by: [name] MyChart Message (if you have MyChart) OR . A paper copy in the mail If you have any lab test that is abnormal or we need to change your treatment, we will call you to review the results.  Follow-Up: At [facility], you and your health needs are our priority.  As part of our continuing mission to provide you with exceptional heart care, we have created designated Provider Care Teams.  These Care Teams include your primary Cardiologist (physician) and Advanced Practice Providers (APPs -  Physician Assistants and Nurse Practitioners) who all work together to provide you with the care you need, when you need it.  We recommend signing up for the patient portal called "MyChart".  Sign up information is provided on this After Visit Summary.  MyChart is used to connect with patients for Virtual Visits (Telemedicine).  Patients are able to view lab/test results, encounter notes, upcoming appointments, etc.  Non-urgent messages can be sent to your provider as well.   To learn more about what you can do with MyChart, go to [email or web address].    Your next appointment:   Your physician  recommends that you schedule a follow-up appointment in: 4 WEEKS with Oda Kilts, PA-C -- 06/30/20 at 10:55 am   The format for your next appointment:   In Person with Legrand Como "Jonni Sanger" Chalmers Cater, PA-C

## 2020-05-21 NOTE — Addendum Note (Signed)
Addended by: Campbell Riches on: 05/21/2020 01:17 PM   Modules accepted: Orders

## 2020-05-29 ENCOUNTER — Other Ambulatory Visit: Payer: Self-pay | Admitting: Cardiology

## 2020-05-29 ENCOUNTER — Telehealth: Payer: Self-pay | Admitting: Cardiology

## 2020-05-29 NOTE — Telephone Encounter (Signed)
Spoke with [facility] who states that they handle the patient's medications and he requested a refill of ASA when a 90 day supply was recently filled. They asked the patient why he needed a refill and he stated that he was told by cardiology to take 4 aspirin per day. I do not see a note of anyone advising the patient to take 4 aspirin per day. Patient is prescribed ASA 81 mg daily. [facility] states that their pharmacist will call the patient to explain this to him and make sure he is taking it correctly.

## 2020-05-29 NOTE — Telephone Encounter (Signed)
Pt c/o medication issue:  1. Name of Medication: aspirin EC 81 MG EC tablet   2. How are you currently taking this medication (dosage and times per day)? Per [place], patient states he was told to take aspirin 4x daily.   3. Are you having a reaction (difficulty breathing--STAT)? no  4. What is your medication issue? [facility] calling to speak with a nurse. Patient reports cardiology increased his aspirin intake to 4 tablets daily. She wants to make sure this is correct because none of the OV notes she has states this.

## 2020-06-02 ENCOUNTER — Telehealth: Payer: Self-pay

## 2020-06-02 NOTE — Telephone Encounter (Signed)
Called [facility] to remind them that the patient has lab orders requested to be drawn for a BMET and Magnesium level to be drawn at next home visit by the nurse. Gave verbal as well as let the [facility] agency know that the patient had a written order that was given to him at last office visit. Home Health agency is aware and agreeable.

## 2020-06-04 ENCOUNTER — Other Ambulatory Visit: Payer: Self-pay

## 2020-06-04 ENCOUNTER — Ambulatory Visit (INDEPENDENT_AMBULATORY_CARE_PROVIDER_SITE_OTHER): Payer: Medicare Other | Admitting: Vascular Surgery

## 2020-06-04 ENCOUNTER — Ambulatory Visit: Payer: Medicare Other | Admitting: Student

## 2020-06-04 ENCOUNTER — Ambulatory Visit (HOSPITAL_COMMUNITY)
Admission: RE | Admit: 2020-06-04 | Discharge: 2020-06-04 | Disposition: A | Discharge status: Home / Self Care | Payer: Medicare Other | Source: Ambulatory Visit | Attending: Vascular Surgery | Admitting: Vascular Surgery

## 2020-06-04 ENCOUNTER — Encounter: Payer: Self-pay | Admitting: Vascular Surgery

## 2020-06-04 VITALS — BP 106/62 | HR 93 | Resp 18 | Ht 74.0 in | Wt >= 6400 oz

## 2020-06-04 DIAGNOSIS — R600 Localized edema: Secondary | ICD-10-CM | POA: Diagnosis present

## 2020-06-04 DIAGNOSIS — I872 Venous insufficiency (chronic) (peripheral): Secondary | ICD-10-CM

## 2020-06-04 NOTE — Progress Notes (Signed)
REASON FOR VISIT:    Follow-up of chronic venous insufficiency  ASSESSMENT & PLAN:   CHRONIC VENOUS INSUFFICIENCY: This patient has CEAP C6 venous disease.  Fortunately, his arterial Doppler study today shows no evidence of significant arterial insufficiency.  He has biphasic Doppler signals in both feet with an ABI of 98% on the right.  Therefore I think we can aggressively treat his venous disease with elevation and compression.I have discussed conservative measures for the treatment of this patient's venous disease.  Specifically, I discussed the importance of leg elevation and the proper positioning for this.  I have encouraged him to continue with his compressive therapy.  Because of his size he is using a 20-30 knee-high stocking and then an Ace bandage on the thigh.  I have encouraged the patient to avoid prolonged sitting and standing.  I have also discussed the importance of exercise specifically walking and water aerobics.  In addition we have discussed the importance of maintaining a healthy weight as central obesity especially increases lower extremity venous pressure.  Given that the ulcers have not made significant progress I think he is a candidate for laser ablation of the right great saphenous vein from the distal thigh to the saphenofemoral junction.  I have discussed the indications for endovenous laser ablation of the right GSV, that is to lower the pressure in the veins and potentially help relieve the symptoms from venous hypertension. I have also discussed alternative options including conservative treatment with leg elevation, compression therapy, exercise, avoiding prolonged sitting and standing, and weight management. I have discussed the potential complications of the procedure, including, but not limited to: bleeding, bruising, leg swelling, nerve injury, skin burns, significant pain from phlebitis, deep venous thrombosis, or failure of the vein to close.  I have also  explained that venous insufficiency is a chronic disease, and that the patient is at risk for recurrent varicose veins in the future.  All of the patient's questions were encouraged and answered. They are agreeable to proceed.   [name], MD Office: [phone]   HPI:   Lawrence [name] is a pleasant 64 y.o. male, who I saw on 01/16/2020 with significant bilateral lower extremity swelling and chronic venous insufficiency.  He had CEAP C6 venous disease.  The patient had a nonhealing ulcer.  He had monophasic Doppler signals in his feet and I ordered ABIs.  I recommended thigh-high compression stockings with a gradient of 20 to 30 mmHg.  We discussed the importance of leg elevation the proper positioning for this.  We discussed the importance of weight loss.  I also encouraged him to exercise.  He comes in for 73-monthfollow-up visit.  My feeling was that if the wound was not improving he might be a candidate for endovenous laser ablation of the right great saphenous vein from the distal thigh to the saphenofemoral junction.  Since I saw him last the patient has been wearing his compression stockings to the knee with Ace bandage wraps of the thigh given that he cannot be fitted for thigh-high compression stockings.  He has been elevating his legs.  Is been no significant improvement in his ulcerations.  Past Medical History:  Diagnosis Date  . Acute on chronic diastolic CHF (congestive heart failure) [place] 01/13/2018  . [place] respiratory [place] with hypoxia [place] 01/13/2018  . Adenomatous colon polyp 2007  . Anasarca 01/31/2017  . Anemia   . Anxiety   . Arthritis   . Asthma   . Bilateral lower  extremity edema 01/31/2017  . Bilateral lower leg cellulitis   . BPH without obstruction/lower urinary tract symptoms 02/22/2017  . Cellulitis 11/07/2017  . Cellulitis of lower extremity   . Chest pain   . Chronic cough   . Chronic diastolic CHF (congestive heart failure) ([place])   . Chronic venous  stasis 03/07/2019  . COPD (chronic obstructive pulmonary disease) ([place])   . Coronary artery calcification seen on CAT scan   . Depression   . Diabetic neuropathy ([place]) 09/11/2019  . Hematuria 02/22/2017  . History of anemia 12/26/2018  . History of cardiac cath    a.  01/2018 with angiographically minimal coronary disease with  up to 40% prox LAD and 20% mid LCx and OM2 with normal LVF and normal LVEDP..  . History of colon polyps 08/24/2018  . Hypertension   . Leukocytosis 08/18/2018  . Morbid obesity ([place])   . New onset type 2 diabetes mellitus ([place]) 11/08/2017  . OSA (obstructive sleep apnea)   . Pain due to onychomycosis of toenails of both feet 09/11/2019  . Peripheral neuropathy 02/22/2017  . Physical deconditioning 02/22/2017  . Primary osteoarthritis, left shoulder 03/05/2017  . PTSD (post-traumatic stress disorder)   . Pure hypercholesterolemia   . QT prolongation 03/07/2019  . Right foot pain 01/13/2018  . Shortness of breath   . Sinus tachycardia 03/07/2019  . Sleep apnea    CPAP  . Type 2 diabetes mellitus with vascular disease ([place]) 09/11/2019    Family History  Problem Relation Age of Onset  . CAD Maternal Grandfather   . Diabetes Other   . Diabetes Mellitus II Neg Hx   . Colon cancer Neg Hx   . Esophageal cancer Neg Hx   . Inflammatory bowel disease Neg Hx   . Liver disease Neg Hx   . Pancreatic cancer Neg Hx   . Rectal cancer Neg Hx   . Stomach cancer Neg Hx     SOCIAL HISTORY: Social History   Socioeconomic History  . Marital status: Single    Spouse name: Not on file  . Number of children: 1  . Years of education: Not on file  . Highest education level: Not on file  Occupational History  . Occupation: retired  Tobacco Use  . Smoking status: Former Smoker    Quit date: 03/07/2016    Years since quitting: 4.2  . Smokeless tobacco: Never Used  Substance and Sexual Activity  . Alcohol use: Not Currently    Comment: rare  . Drug use: No  . Sexual activity: Not  on file  Other Topics Concern  . Not on file  Social History Narrative  . Not on file   Social Determinants of Health   Financial Resource Strain:   . Difficulty of Paying Living Expenses: Not on file  Food Insecurity:   . Worried About Charity fundraiser in the Last Year: Not on file  . Ran Out of Food in the Last Year: Not on file  Transportation Needs:   . Lack of Transportation (Medical): Not on file  . Lack of Transportation (Non-Medical): Not on file  Physical Activity:   . Days of Exercise per Week: Not on file  . Minutes of Exercise per Session: Not on file  Stress:   . Feeling of Stress : Not on file  Social Connections:   . Frequency of Communication with Friends and Family: Not on file  . Frequency of Social Gatherings with Friends and Family: Not on  file  . Attends Religious Services: Not on file  . Active Member of Clubs or Organizations: Not on file  . Attends Archivist Meetings: Not on file  . Marital Status: Not on file  Intimate Partner Violence:   . Fear of Current or Ex-Partner: Not on file  . Emotionally Abused: Not on file  . Physically Abused: Not on file  . Sexually Abused: Not on file    Allergies  Allergen Reactions  . Vancomycin Other (See Comments)    "Red Man Syndrome"  . Tubersol [Tuberculin] Other (See Comments)    Reaction unknown  . Doxycycline Rash    Current Outpatient Medications  Medication Sig Dispense Refill  . acetaminophen (TYLENOL) 650 MG CR tablet Take 1,300 mg by mouth every 8 (eight) hours as needed for pain.    [name] albuterol (VENTOLIN HFA) 108 (90 Base) MCG/ACT inhaler Inhale into the lungs.    [name] allopurinol (ZYLOPRIM) 100 MG tablet Take 200 mg by mouth daily.    . ARIPiprazole (ABILIFY) 5 MG tablet Take 5 mg by mouth at bedtime.     [name] atorvastatin (LIPITOR) 40 MG tablet TAKE ONE TABLET BY MOUTH DAILY AT 6PM 90 tablet 2  . blood glucose meter kit and supplies Dispense based on patient and insurance preference.  Use up to four times daily as directed. (FOR ICD-10 E10.9, E11.9). 1 each 0  . cetirizine (ZYRTEC) 10 MG tablet Take 10 mg by mouth at bedtime.     . colchicine 0.6 MG tablet Take 0.6 mg by mouth as needed (gout).     . Dulaglutide (TRULICITY) 3 AQ/7.6AU SOPN Inject 3 mg into the skin once a week.    . ferrous sulfate 324 (65 Fe) MG TBEC Take 324 mg by mouth daily with breakfast.     . fluticasone (FLONASE) 50 MCG/ACT nasal spray Place 2 sprays into both nostrils daily.     . furosemide (LASIX) 80 MG tablet Take 2 tablets (160 mg total) every morning and 1 tablet (80 mg total) every evening. 270 tablet 3  . lisinopril (ZESTRIL) 20 MG tablet Take 1 tablet (20 mg total) by mouth daily. 90 tablet 3  . loperamide (IMODIUM) 2 MG capsule Take 2 mg by mouth 2 (two) times a day.     . meloxicam (MOBIC) 15 MG tablet Take 15 mg by mouth daily.    . metFORMIN (GLUCOPHAGE) 1000 MG tablet Take 1,000 mg by mouth 2 (two) times daily with a meal.    . metoprolol succinate (TOPROL-XL) 100 MG 24 hr tablet Take 1 tablet (100 mg total) by mouth in the morning and at bedtime. Take with or immediately following a meal. 90 tablet 3  . prazosin (MINIPRESS) 2 MG capsule Take 4 mg by mouth at bedtime.    . pregabalin (LYRICA) 100 MG capsule Take 100 mg by mouth in the morning, at noon, and at bedtime.     . propranolol (INDERAL) 10 MG tablet TAKE ONE TABLET BY MOUTH AS NEEDED 10 tablet 10  . spironolactone (ALDACTONE) 25 MG tablet Take 1 tablet (25 mg total) by mouth daily. 10 tablet 0  . temazepam (RESTORIL) 15 MG capsule Take 15 mg by mouth at bedtime.     . traZODone (DESYREL) 100 MG tablet Take 200 mg by mouth at bedtime.    Loura Pardon Salicylate (ASPERCREME EX) Apply 1 application topically 2 (two) times daily as needed (pain and inflammation).    . venlafaxine XR (EFFEXOR-XR) 150 MG  24 hr capsule Take 150 mg by mouth daily with breakfast.    . Vitamin D, Ergocalciferol, (DRISDOL) 1.25 MG (50000 UNIT) CAPS capsule  Take 50,000 Units by mouth every 7 (seven) days.    [name] aspirin EC 81 MG EC tablet Take 1 tablet (81 mg total) by mouth daily. (Patient not taking: Reported on 06/04/2020)    . silver sulfADIAZINE (SILVADENE) 1 % cream Apply 1 application topically See admin instructions. Apply as directed once a day as directed to bilateral legs (Patient not taking: Reported on 06/04/2020)     No current facility-administered medications for this visit.    REVIEW OF SYSTEMS:  _0  denotes positive finding, _1  denotes negative finding Cardiac  Comments:  Chest pain or chest pressure:    Shortness of breath upon exertion:    Short of breath when lying flat:    Irregular heart rhythm:        Vascular    Pain in calf, thigh, or hip brought on by ambulation:    Pain in feet at night that wakes you up from your sleep:     Blood clot in your veins:    Leg swelling:  x       Pulmonary    Oxygen at home:    Productive cough:     Wheezing:         Neurologic    Sudden weakness in arms or legs:     Sudden numbness in arms or legs:     Sudden onset of difficulty speaking or slurred speech:    Temporary loss of vision in one eye:     Problems with dizziness:         Gastrointestinal    Blood in stool:     Vomited blood:         Genitourinary    Burning when urinating:     Blood in urine:        Psychiatric    Major depression:         Hematologic    Bleeding problems:    Problems with blood clotting too easily:        Skin    Rashes or ulcers: x       Constitutional    Fever or chills:     PHYSICAL EXAM:   Vitals:   06/04/20 1328  BP: 106/62  Pulse: 93  Resp: 18  SpO2: 93%  Weight: (!) 428 lb (194.1 kg)  Height: _2  (1.88 m)    Body mass index is 54.95 kg/m.  GENERAL: The patient is a well-nourished male, in no acute distress. The vital signs are documented above. CARDIAC: There is a regular rate and rhythm.  VASCULAR: I do not detect carotid bruits. He has significant  bilateral lower extremity swelling with superficial ulcerations in the right leg as documented in the photograph below.   I did look at his right great saphenous vein myself with the SonoSite and it looks like we could cannulate this above the knee. PULMONARY: There is good air exchange bilaterally without wheezing or rales. ABDOMEN: Soft and non-tender with normal pitched bowel sounds.  MUSCULOSKELETAL: There are no major deformities or cyanosis. NEUROLOGIC: No focal weakness or paresthesias are detected. SKIN: There are no ulcers or rashes noted. PSYCHIATRIC: The patient has a normal affect.  DATA:    VENOUS DUPLEX: I did review his previous venous duplex scan that was done on 01/02/2020.  On the right side he had deep  venous reflux involving the common femoral vein.  There was no evidence of DVT.  He had superficial venous reflux in the right great saphenous vein from the saphenofemoral junction to the knee.  Diameters ranged from 0.57-0.80 cm.

## 2020-06-09 ENCOUNTER — Encounter: Payer: Self-pay | Admitting: Vascular Surgery

## 2020-06-19 ENCOUNTER — Ambulatory Visit (INDEPENDENT_AMBULATORY_CARE_PROVIDER_SITE_OTHER): Payer: Medicare Other | Admitting: Podiatry

## 2020-06-19 ENCOUNTER — Encounter: Payer: Self-pay | Admitting: Podiatry

## 2020-06-19 ENCOUNTER — Other Ambulatory Visit: Payer: Self-pay

## 2020-06-19 DIAGNOSIS — B351 Tinea unguium: Secondary | ICD-10-CM

## 2020-06-19 DIAGNOSIS — E1142 Type 2 diabetes mellitus with diabetic polyneuropathy: Secondary | ICD-10-CM | POA: Diagnosis not present

## 2020-06-19 DIAGNOSIS — I878 Other specified disorders of veins: Secondary | ICD-10-CM

## 2020-06-19 DIAGNOSIS — M79674 Pain in right toe(s): Secondary | ICD-10-CM

## 2020-06-19 DIAGNOSIS — M79675 Pain in left toe(s): Secondary | ICD-10-CM

## 2020-06-19 NOTE — Progress Notes (Signed)
This patient returns to my office for at risk foot care.  This patient requires this care by a professional since this patient will be at risk due to having diabetic neuropathy and angiopathy and venous stasis.  Patient has been just treated for cellulitis right leg. This patient presents to the office with his wife.This patient is unable to cut nails himself since the patient cannot reach his nails.These nails are painful walking and wearing shoes.  This patient presents for at risk foot care today.  General Appearance  Alert, conversant and in no acute stress.  Vascular  Dorsalis pedis and posterior tibial  pulses are not  palpable  billaterally due to severe swelling feet.  Capillary return is within normal limits  bilaterally. Temperature is within normal limits  Bilaterally. Venous stasis right leg.  Neurologic  Senn-Weinstein monofilament wire test absent right foot.  Diminished left foot.. Muscle power within normal limits bilaterally.  Nails Thick disfigured discolored nails with subungual debris  from hallux to fifth toes bilaterally.  Pincer hallux nails   B/L. No evidence of bacterial infection or drainage bilaterally.  Orthopedic  No limitations of motion  feet .  No crepitus or effusions noted.  No bony pathology or digital deformities noted.  Severe HAV  B/L.  Skin  normotropic skin with no porokeratosis noted bilaterally.  No signs of infections or ulcers noted.     Onychomycosis  Pain in right toes  Pain in left toes  Consent was obtained for treatment procedures.   Mechanical debridement of nails 1-5  bilaterally performed with a nail nipper.  Filed with dremel without incident.    Return office visit   3 months                   Told patient to return for periodic foot care and evaluation due to potential at risk complications.   Gardiner Barefoot DPM

## 2020-06-24 ENCOUNTER — Ambulatory Visit (INDEPENDENT_AMBULATORY_CARE_PROVIDER_SITE_OTHER): Payer: Medicare Other | Admitting: Cardiology

## 2020-06-24 ENCOUNTER — Encounter: Payer: Self-pay | Admitting: Cardiology

## 2020-06-24 ENCOUNTER — Other Ambulatory Visit: Payer: Self-pay

## 2020-06-24 VITALS — BP 114/70 | HR 98 | Ht 74.0 in | Wt >= 6400 oz

## 2020-06-24 DIAGNOSIS — I2510 Atherosclerotic heart disease of native coronary artery without angina pectoris: Secondary | ICD-10-CM

## 2020-06-24 DIAGNOSIS — I10 Essential (primary) hypertension: Secondary | ICD-10-CM | POA: Diagnosis not present

## 2020-06-24 DIAGNOSIS — I471 Supraventricular tachycardia, unspecified: Secondary | ICD-10-CM

## 2020-06-24 DIAGNOSIS — I5032 Chronic diastolic (congestive) heart failure: Secondary | ICD-10-CM

## 2020-06-24 DIAGNOSIS — E7800 Pure hypercholesterolemia, unspecified: Secondary | ICD-10-CM

## 2020-06-24 DIAGNOSIS — E1159 Type 2 diabetes mellitus with other circulatory complications: Secondary | ICD-10-CM

## 2020-06-24 DIAGNOSIS — I2583 Coronary atherosclerosis due to lipid rich plaque: Secondary | ICD-10-CM

## 2020-06-24 NOTE — Patient Instructions (Signed)
Medication Instructions:  Your physician recommends that you continue on your current medications as directed. Please refer to the Current Medication list given to you today.  *If you need a refill on your cardiac medications before your next appointment, please call your pharmacy*  Follow-Up: At [facility], you and your health needs are our priority.  As part of our continuing mission to provide you with exceptional heart care, we have created designated Provider Care Teams.  These Care Teams include your primary Cardiologist (physician) and Advanced Practice Providers (APPs -  Physician Assistants and Nurse Practitioners) who all work together to provide you with the care you need, when you need it.  Your next appointment:   6 month(s)  The format for your next appointment:   In Person  Provider:   You may see Traci Turner, MD or one of the following Advanced Practice Providers on your designated Care Team:    Dayna Dunn, PA-C  Michele Lenze, PA-C    

## 2020-06-24 NOTE — Progress Notes (Signed)
Cardiology Office Note:    Date:  06/24/2020   ID:  Lawrence [name], DOB November 13, 1955, MRN [id number]  PCP:  Jenny Reichmann, PA-C  Cardiologist:  Fransico Him, MD    Referring MD: Darra Lis*   Chief Complaint  Patient presents with  . Coronary Artery Disease  . Hypertension  . Hyperlipidemia  . Congestive Heart Failure    History of Present Illness:    Lawrence [name] is a 65 y.o. male with a hx of nonobstructive coronary artery disease, hypertension, hyperlipidemia, diastolic heart failure, OSA on BiPAP, super morbid obesity, depression, anxiety.  He is a DNR.  He was seen in the hospital 01/25/2018 for evaluation of chest pain and shortness of breath.  Left heart catheterization was subsequently performed which showed minimal coronary artery disease.    He presented the emergency room 02/19/2019 with palpitations.  No arrhythmias were noted.  He wore a ZIO patch which showed multiple episodes of SVT. He was seen by EP and Toprol was increased to $RemoveBefo'75mg'qAOUQhGYnbY$  BID.   Lawrence [name] is morbidly obese.  His activity level is significantly impaired.  He uses a walker at home and a motorized chair when out.  He has chronic lower extremity edema.   He is here today for followup.  He continues to have chronic DOE due to supra morbid obesity which appears stable. He has chronic LE edema from chronic venous insuff and is having surgery for his veins in January. He denies any  PND, orthopnea, dizziness, palpitations or syncope. He is compliant with his meds and is tolerating meds with no SE.    He is doing well with his CPAP device and thinks that he has gotten used to it.  He tolerates the mask and feels the pressure is adequate.  Since going on CPAP he feels rested in the am but does nap some during the day.  He denies any significant mouth or nasal dryness or nasal congestion.  He does not think that he snores.     Past Medical History:  Diagnosis Date  . Acute on chronic diastolic CHF  (congestive heart failure) ([place]) 01/13/2018  . Acute respiratory failure with hypoxia ([place]) 01/13/2018  . Adenomatous colon polyp 2007  . Anasarca 01/31/2017  . Anemia   . Anxiety   . Arthritis   . Asthma   . Bilateral lower extremity edema 01/31/2017  . Bilateral lower leg cellulitis   . BPH without obstruction/lower urinary tract symptoms 02/22/2017  . Cellulitis 11/07/2017  . Cellulitis of lower extremity   . Chest pain   . Chronic cough   . Chronic diastolic CHF (congestive heart failure) ([place])   . Chronic venous stasis 03/07/2019  . COPD (chronic obstructive pulmonary disease) ([place])   . Coronary artery calcification seen on CAT scan   . Depression   . Diabetic neuropathy ([place]) 09/11/2019  . Hematuria 02/22/2017  . History of anemia 12/26/2018  . History of cardiac cath    a.  01/2018 with angiographically minimal coronary disease with  up to 40% prox LAD and 20% mid LCx and OM2 with normal LVF and normal LVEDP..  . History of colon polyps 08/24/2018  . Hypertension   . Leukocytosis 08/18/2018  . Morbid obesity ([place])   . New onset type 2 diabetes mellitus ([place]) 11/08/2017  . OSA (obstructive sleep apnea)   . Pain due to onychomycosis of toenails of both feet 09/11/2019  . Peripheral neuropathy 02/22/2017  . Physical deconditioning 02/22/2017  .  Primary osteoarthritis, left shoulder 03/05/2017  . PTSD (post-traumatic stress disorder)   . Pure hypercholesterolemia   . QT prolongation 03/07/2019  . Right foot pain 01/13/2018  . Shortness of breath   . Sinus tachycardia 03/07/2019  . Sleep apnea    CPAP  . Type 2 diabetes mellitus with vascular disease ([place]) 09/11/2019    Past Surgical History:  Procedure Laterality Date  . JOINT REPLACEMENT     left knee replacement x 2  . KNEE ARTHROSCOPY Bilateral   . LEFT HEART CATH AND CORONARY ANGIOGRAPHY N/A 01/17/2018   Procedure: LEFT HEART CATH AND CORONARY ANGIOGRAPHY;  Surgeon: Leonie Man, MD;  Location: [facility];  Service:  Cardiovascular;  Laterality: N/A;  . UMBILICAL HERNIA REPAIR      Current Medications: Current Meds  Medication Sig  . acetaminophen (TYLENOL) 650 MG CR tablet Take 1,300 mg by mouth every 8 (eight) hours as needed for pain.  [name] albuterol (VENTOLIN HFA) 108 (90 Base) MCG/ACT inhaler Inhale into the lungs.  [name] allopurinol (ZYLOPRIM) 100 MG tablet Take 200 mg by mouth daily.  . ARIPiprazole (ABILIFY) 5 MG tablet Take 5 mg by mouth at bedtime.   [name] atorvastatin (LIPITOR) 40 MG tablet TAKE ONE TABLET BY MOUTH DAILY AT 6PM  . blood glucose meter kit and supplies Dispense based on patient and insurance preference. Use up to four times daily as directed. (FOR ICD-10 E10.9, E11.9).  . cetirizine (ZYRTEC) 10 MG tablet Take 10 mg by mouth at bedtime.   . colchicine 0.6 MG tablet Take 0.6 mg by mouth as needed (gout).   . Dulaglutide (TRULICITY) 3 FT/7.3UK SOPN Inject 3 mg into the skin once a week.  . ferrous sulfate 324 (65 Fe) MG TBEC Take 324 mg by mouth daily with breakfast.   . fluticasone (FLONASE) 50 MCG/ACT nasal spray Place 2 sprays into both nostrils daily.   . furosemide (LASIX) 80 MG tablet Take 2 tablets (160 mg total) every morning and 1 tablet (80 mg total) every evening.  [name] lisinopril (ZESTRIL) 20 MG tablet Take 1 tablet (20 mg total) by mouth daily.  [name] loperamide (IMODIUM) 2 MG capsule Take 2 mg by mouth 2 (two) times a day.   . metFORMIN (GLUCOPHAGE) 1000 MG tablet Take 1,000 mg by mouth 2 (two) times daily with a meal.  . metoprolol succinate (TOPROL-XL) 100 MG 24 hr tablet Take 1 tablet (100 mg total) by mouth in the morning and at bedtime. Take with or immediately following a meal.  . omeprazole (PRILOSEC) 20 MG capsule Take 20 mg by mouth every morning.  . prazosin (MINIPRESS) 2 MG capsule Take 4 mg by mouth at bedtime.  . pregabalin (LYRICA) 100 MG capsule Take 100 mg by mouth in the morning, at noon, and at bedtime.   . propranolol (INDERAL) 10 MG tablet TAKE ONE TABLET BY MOUTH  AS NEEDED  . silver sulfADIAZINE (SILVADENE) 1 % cream Apply 1 application topically See admin instructions. Apply as directed once a day as directed to bilateral legs  . spironolactone (ALDACTONE) 25 MG tablet Take 1 tablet (25 mg total) by mouth daily.  . temazepam (RESTORIL) 15 MG capsule Take 15 mg by mouth at bedtime.   . traZODone (DESYREL) 100 MG tablet Take 200 mg by mouth at bedtime.  Loura Pardon Salicylate (ASPERCREME EX) Apply 1 application topically 2 (two) times daily as needed (pain and inflammation).  . venlafaxine XR (EFFEXOR-XR) 150 MG 24 hr capsule Take 150  mg by mouth daily with breakfast.  . Vitamin D, Ergocalciferol, (DRISDOL) 1.25 MG (50000 UNIT) CAPS capsule Take 50,000 Units by mouth every 7 (seven) days.     Allergies:   Vancomycin, Tubersol [tuberculin], and Doxycycline   Social History   Socioeconomic History  . Marital status: Single    Spouse name: Not on file  . Number of children: 1  . Years of education: Not on file  . Highest education level: Not on file  Occupational History  . Occupation: retired  Tobacco Use  . Smoking status: Former Smoker    Quit date: 03/07/2016    Years since quitting: 4.3  . Smokeless tobacco: Never Used  Substance and Sexual Activity  . Alcohol use: Not Currently    Comment: rare  . Drug use: No  . Sexual activity: Not on file  Other Topics Concern  . Not on file  Social History Narrative  . Not on file   Social Determinants of Health   Financial Resource Strain:   . Difficulty of Paying Living Expenses: Not on file  Food Insecurity:   . Worried About Charity fundraiser in the Last Year: Not on file  . Ran Out of Food in the Last Year: Not on file  Transportation Needs:   . Lack of Transportation (Medical): Not on file  . Lack of Transportation (Non-Medical): Not on file  Physical Activity:   . Days of Exercise per Week: Not on file  . Minutes of Exercise per Session: Not on file  Stress:   . Feeling of  Stress : Not on file  Social Connections:   . Frequency of Communication with Friends and Family: Not on file  . Frequency of Social Gatherings with Friends and Family: Not on file  . Attends Religious Services: Not on file  . Active Member of Clubs or Organizations: Not on file  . Attends Archivist Meetings: Not on file  . Marital Status: Not on file     Family History: The patient's family history includes CAD in his maternal grandfather; Diabetes in an other family member. There is no history of Diabetes Mellitus II, Colon cancer, Esophageal cancer, Inflammatory bowel disease, Liver disease, Pancreatic cancer, Rectal cancer, or Stomach cancer.  ROS:   Please see the history of present illness.    ROS  All other systems reviewed and negative.   EKGs/Labs/Other Studies Reviewed:    The following studies were reviewed today: PAP compliance download from [facility], outside labs   EKG:  EKG is not ordered today.  Recent Labs: 12/04/2019: ALT 21 02/04/2020: B Natriuretic Peptide 49.8; Hemoglobin 13.2; Platelets 248 04/01/2020: TSH 1.690 05/20/2020: BUN 33; Creatinine, Ser 1.16; Magnesium 2.3; Potassium 4.8; Sodium 135   Recent Lipid Panel    Component Value Date/Time   CHOL 149 08/18/2018 0144   TRIG 174 (H) 08/18/2018 0144   HDL 36 (L) 08/18/2018 0144   CHOLHDL 4.1 08/18/2018 0144   VLDL 35 08/18/2018 0144   LDLCALC 78 08/18/2018 0144    Physical Exam:    VS:  BP 114/70   Pulse 98   Ht $R'6\' 2"'VG$  (1.88 m)   Wt (!) 425 lb (192.8 kg) Comment: per pt report as unable to stand  SpO2 90%   BMI 54.57 kg/m     Wt Readings from Last 3 Encounters:  06/24/20 (!) 425 lb (192.8 kg)  06/04/20 (!) 428 lb (194.1 kg)  05/20/20 (!) 430 lb (195 kg)  GEN: Well nourished, well developed in no acute distress, morbidly obese HEENT: Normal NECK: No JVD; No carotid bruits LYMPHATICS: No lymphadenopathy CARDIAC:RRR, no murmurs, rubs, gallops RESPIRATORY:  Clear to auscultation  without rales, wheezing or rhonchi  ABDOMEN: Soft, non-tender, non-distended MUSCULOSKELETAL:  No deformity.  Chronic venous insuff with chronic venous stasis skin changes SKIN: Warm and dry NEUROLOGIC:  Alert and oriented x 3 PSYCHIATRIC:  Normal affect    ASSESSMENT:    1. SVT (supraventricular tachycardia) ([place])   2. Coronary artery disease due to lipid rich plaque   3. Essential hypertension, benign   4. Morbid obesity ([place])   5. Pure hypercholesterolemia   6. Type 2 diabetes mellitus with vascular disease ([place])   7. Chronic diastolic CHF (congestive heart failure) ([facility])    PLAN:    In order of problems listed above:  1.  SVT  -followed by EP in the past -palpitations still present occasionally but significantly improved on Toprol XL 100mg  BID  2.  ASCAD -minimal coronary artery disease by cath 2019 -he has not had any anginal sx -still has chronic DOE that is stable -continue ASA, BB and statin  3.  HTN -BP well controlled on exam -continue prazosin 4mg  qhs, Lisinopril 20mg  daily, spiro 25mg  daily and Toprol XL 100mg  BID -outside labs reviewed on [facility] showing a bump in SCR to 1.53 on 06/11/2020 from last BMET   4.  Morbid Obesity  -I have encouraged him to cut back on carbs and portions.   5.  HLD -LDL goal < 70 -LDL was 64 in May 2021 -continue atorvastatin 40mg  daily  6.  DM type 2 with CKD stage 3a -followed by PCP -HbA1C was 7.9%  -continue glipizide 5mg  BID and Metformin 1000mg  BID -continue statin and ACE I -seeing nephrology in the near future  7.  Chronic diastolic CHF -volume status if very difficult to assess in setting of morbid obesity -he has lost 5lbs in the past month -he has chronic LE edema from morbid obesity and chronic venous insuff which are stable and his DOE is very stable -continue Lasix 160mg  daily and 80mg  qpm  Followup with me in 6 months  Medication Adjustments/Labs and Tests Ordered: Current medicines are reviewed at length  with the patient today.  Concerns regarding medicines are outlined above.  No orders of the defined types were placed in this encounter.  No orders of the defined types were placed in this encounter.   Signed, Fransico Him, MD  06/24/2020 9:03 AM    [place]

## 2020-06-30 ENCOUNTER — Ambulatory Visit: Payer: Medicare Other | Admitting: Student

## 2020-07-10 ENCOUNTER — Other Ambulatory Visit: Payer: Self-pay | Admitting: Nephrology

## 2020-07-10 DIAGNOSIS — N179 Acute kidney failure, unspecified: Secondary | ICD-10-CM

## 2020-07-15 ENCOUNTER — Other Ambulatory Visit: Payer: Self-pay | Admitting: *Deleted

## 2020-07-15 DIAGNOSIS — I83012 Varicose veins of right lower extremity with ulcer of calf: Secondary | ICD-10-CM

## 2020-07-15 DIAGNOSIS — L97219 Non-pressure chronic ulcer of right calf with unspecified severity: Secondary | ICD-10-CM

## 2020-07-30 ENCOUNTER — Other Ambulatory Visit: Payer: Medicare Other

## 2020-08-13 ENCOUNTER — Other Ambulatory Visit: Payer: Self-pay | Admitting: *Deleted

## 2020-08-13 ENCOUNTER — Ambulatory Visit
Admission: RE | Admit: 2020-08-13 | Discharge: 2020-08-13 | Disposition: A | Discharge status: Home / Self Care | Payer: Medicare Other | Source: Ambulatory Visit | Attending: Nephrology | Admitting: Nephrology

## 2020-08-13 ENCOUNTER — Other Ambulatory Visit: Payer: Medicare Other

## 2020-08-13 DIAGNOSIS — N281 Cyst of kidney, acquired: Secondary | ICD-10-CM | POA: Diagnosis not present

## 2020-08-13 DIAGNOSIS — N179 Acute kidney failure, unspecified: Secondary | ICD-10-CM

## 2020-08-18 ENCOUNTER — Other Ambulatory Visit: Payer: Self-pay | Admitting: *Deleted

## 2020-08-18 DIAGNOSIS — I130 Hypertensive heart and chronic kidney disease with heart failure and stage 1 through stage 4 chronic kidney disease, or unspecified chronic kidney disease: Secondary | ICD-10-CM | POA: Diagnosis not present

## 2020-08-18 DIAGNOSIS — I2510 Atherosclerotic heart disease of native coronary artery without angina pectoris: Secondary | ICD-10-CM | POA: Diagnosis not present

## 2020-08-18 DIAGNOSIS — E1151 Type 2 diabetes mellitus with diabetic peripheral angiopathy without gangrene: Secondary | ICD-10-CM | POA: Diagnosis not present

## 2020-08-18 DIAGNOSIS — E785 Hyperlipidemia, unspecified: Secondary | ICD-10-CM | POA: Diagnosis not present

## 2020-08-18 DIAGNOSIS — I872 Venous insufficiency (chronic) (peripheral): Secondary | ICD-10-CM | POA: Diagnosis not present

## 2020-08-18 DIAGNOSIS — N183 Chronic kidney disease, stage 3 unspecified: Secondary | ICD-10-CM | POA: Diagnosis not present

## 2020-08-18 DIAGNOSIS — Z79899 Other long term (current) drug therapy: Secondary | ICD-10-CM | POA: Diagnosis not present

## 2020-08-18 DIAGNOSIS — Z87891 Personal history of nicotine dependence: Secondary | ICD-10-CM | POA: Diagnosis not present

## 2020-08-18 DIAGNOSIS — M109 Gout, unspecified: Secondary | ICD-10-CM | POA: Diagnosis not present

## 2020-08-18 DIAGNOSIS — I5033 Acute on chronic diastolic (congestive) heart failure: Secondary | ICD-10-CM | POA: Diagnosis not present

## 2020-08-18 DIAGNOSIS — Z7984 Long term (current) use of oral hypoglycemic drugs: Secondary | ICD-10-CM | POA: Diagnosis not present

## 2020-08-18 DIAGNOSIS — Z4800 Encounter for change or removal of nonsurgical wound dressing: Secondary | ICD-10-CM | POA: Diagnosis not present

## 2020-08-18 DIAGNOSIS — G4733 Obstructive sleep apnea (adult) (pediatric): Secondary | ICD-10-CM | POA: Diagnosis not present

## 2020-08-18 DIAGNOSIS — L97922 Non-pressure chronic ulcer of unspecified part of left lower leg with fat layer exposed: Secondary | ICD-10-CM | POA: Diagnosis not present

## 2020-08-18 DIAGNOSIS — E1140 Type 2 diabetes mellitus with diabetic neuropathy, unspecified: Secondary | ICD-10-CM | POA: Diagnosis not present

## 2020-08-18 DIAGNOSIS — G4700 Insomnia, unspecified: Secondary | ICD-10-CM | POA: Diagnosis not present

## 2020-08-18 DIAGNOSIS — E1122 Type 2 diabetes mellitus with diabetic chronic kidney disease: Secondary | ICD-10-CM | POA: Diagnosis not present

## 2020-08-18 DIAGNOSIS — J449 Chronic obstructive pulmonary disease, unspecified: Secondary | ICD-10-CM | POA: Diagnosis not present

## 2020-08-18 DIAGNOSIS — I471 Supraventricular tachycardia: Secondary | ICD-10-CM | POA: Diagnosis not present

## 2020-08-18 MED ORDER — LORAZEPAM 1 MG PO TABS: ORAL_TABLET | ORAL | 0 refills | Status: DC

## 2020-08-20 ENCOUNTER — Ambulatory Visit: Payer: Medicare Other | Admitting: Vascular Surgery

## 2020-08-20 ENCOUNTER — Encounter: Payer: Self-pay | Admitting: Vascular Surgery

## 2020-08-20 ENCOUNTER — Other Ambulatory Visit: Payer: Self-pay

## 2020-08-20 VITALS — BP 125/66 | HR 83 | Temp 98.4°F | Resp 18 | Ht 74.0 in | Wt >= 6400 oz

## 2020-08-20 DIAGNOSIS — L97219 Non-pressure chronic ulcer of right calf with unspecified severity: Secondary | ICD-10-CM | POA: Diagnosis not present

## 2020-08-20 DIAGNOSIS — I872 Venous insufficiency (chronic) (peripheral): Secondary | ICD-10-CM

## 2020-08-20 DIAGNOSIS — I83012 Varicose veins of right lower extremity with ulcer of calf: Secondary | ICD-10-CM | POA: Diagnosis not present

## 2020-08-20 HISTORY — PX: ENDOVENOUS ABLATION SAPHENOUS VEIN W/ LASER: SUR449

## 2020-08-20 NOTE — Progress Notes (Signed)
Patient name: Lawrence [name] MRN: [id number] DOB: 23-Mar-1956 Sex: male  REASON FOR VISIT: For laser ablation of the right great saphenous vein  HPI: Lawrence [name] is a 65 y.o. male who I last saw on 06/04/2020.  Has chronic venous insufficiency.  He has CEAP C6 venous disease.  He has no evidence of arterial insufficiency.  I felt that we should aggressively treat his venous disease given his venous ulcer with elevation and compression.  In addition I encouraged him to exercise we also discussed the importance of maintaining a healthy weight.  Given that the wounds have not made significant progress I felt that we should address the incompetence of the right great saphenous vein with laser ablation.  He presents today for that procedure.  I felt that we could cannulate the great saphenous vein just above the knee.  Current Outpatient Medications  Medication Sig Dispense Refill  . acetaminophen (TYLENOL) 650 MG CR tablet Take 1,300 mg by mouth every 8 (eight) hours as needed for pain.    [name] albuterol (VENTOLIN HFA) 108 (90 Base) MCG/ACT inhaler Inhale into the lungs.    [name] allopurinol (ZYLOPRIM) 100 MG tablet Take 200 mg by mouth daily.    . ARIPiprazole (ABILIFY) 5 MG tablet Take 5 mg by mouth at bedtime.     [name] atorvastatin (LIPITOR) 40 MG tablet TAKE ONE TABLET BY MOUTH DAILY AT 6PM 90 tablet 2  . blood glucose meter kit and supplies Dispense based on patient and insurance preference. Use up to four times daily as directed. (FOR ICD-10 E10.9, E11.9). 1 each 0  . cetirizine (ZYRTEC) 10 MG tablet Take 10 mg by mouth at bedtime.     . colchicine 0.6 MG tablet Take 0.6 mg by mouth as needed (gout).     . Dulaglutide (TRULICITY) 3 IO/9.7DZ SOPN Inject 3 mg into the skin once a week.    . ferrous sulfate 324 (65 Fe) MG TBEC Take 324 mg by mouth daily with breakfast.     . fluticasone (FLONASE) 50 MCG/ACT nasal spray Place 2 sprays into both nostrils daily.     . furosemide (LASIX) 80 MG tablet Take 2  tablets (160 mg total) every morning and 1 tablet (80 mg total) every evening. 270 tablet 3  . lisinopril (ZESTRIL) 20 MG tablet Take 1 tablet (20 mg total) by mouth daily. 90 tablet 3  . loperamide (IMODIUM) 2 MG capsule Take 2 mg by mouth 2 (two) times a day.     [name] LORazepam (ATIVAN) 1 MG tablet Take 2 tablets 30 minutes prior to leaving house on day of procedure and bring third tablet with you to office. 3 tablet 0  . metFORMIN (GLUCOPHAGE) 1000 MG tablet Take 1,000 mg by mouth 2 (two) times daily with a meal.    . metoprolol succinate (TOPROL-XL) 100 MG 24 hr tablet Take 1 tablet (100 mg total) by mouth in the morning and at bedtime. Take with or immediately following a meal. 90 tablet 3  . omeprazole (PRILOSEC) 20 MG capsule Take 20 mg by mouth every morning.    . prazosin (MINIPRESS) 2 MG capsule Take 4 mg by mouth at bedtime.    . pregabalin (LYRICA) 100 MG capsule Take 100 mg by mouth in the morning, at noon, and at bedtime.     . propranolol (INDERAL) 10 MG tablet TAKE ONE TABLET BY MOUTH AS NEEDED 10 tablet 10  . silver sulfADIAZINE (SILVADENE) 1 % cream Apply 1 application topically See  admin instructions. Apply as directed once a day as directed to bilateral legs    . spironolactone (ALDACTONE) 25 MG tablet Take 1 tablet (25 mg total) by mouth daily. 10 tablet 0  . temazepam (RESTORIL) 15 MG capsule Take 15 mg by mouth at bedtime.     . traZODone (DESYREL) 100 MG tablet Take 200 mg by mouth at bedtime.    Loura Pardon Salicylate (ASPERCREME EX) Apply 1 application topically 2 (two) times daily as needed (pain and inflammation).    . venlafaxine XR (EFFEXOR-XR) 150 MG 24 hr capsule Take 150 mg by mouth daily with breakfast.    . Vitamin D, Ergocalciferol, (DRISDOL) 1.25 MG (50000 UNIT) CAPS capsule Take 50,000 Units by mouth every 7 (seven) days.     No current facility-administered medications for this visit.    PHYSICAL EXAM: There were no vitals filed for this visit.  PROCEDURE:  For laser ablation of the right great saphenous vein  TECHNIQUE: Given his weight he was taken to an exam room and the vascular lab and this was not done in the procedure room.  The patient was placed supine.  I looked at the right great saphenous vein myself with the SonoSite and I felt I could cannulate this just below the knee.  The right leg was prepped and draped in usual sterile fashion.  Under ultrasound guidance, after the skin was anesthetized, I cannulated the right great saphenous vein just below the knee.  I then advanced the micropuncture sheath over the wire.  I then advanced the J-wire to just below the saphenofemoral junction.  A 65 cm sheath was advanced over the wire and the wire and dilator removed.  The laser fiber was positioned at the end of the sheath and the sheath retracted.  Thus exposing the laser tip.  This was positioned 2.5 cm distal to the saphenofemoral junction.  I then administered tumescent anesthesia circumferentially around the vein from the saphenofemoral junction to the cannulation site.  The patient was placed in Trendelenburg.  We placed laser glasses.  To laser ablation was performed of the right great saphenous vein from 2.5 cm distal to the saphenofemoral junction to just below the knee.  45 cm of length were treated.  A pressure dressing was applied.  Patient tolerated procedure well.  He will return in 1 week for a follow-up duplex.  [name] [facility] [phone]

## 2020-08-20 NOTE — Progress Notes (Signed)
     Laser Ablation Procedure    Date: 08/20/2020   Lawrence [name] DOB:[birth year]  Consent signed: Yes      Surgeon: Gae Gallop MD  Procedure: Laser Ablation: right Greater Saphenous Vein  BP 125/66 (BP Location: Left Arm, Patient Position: Sitting, Cuff Size: Large)   Pulse 83   Temp 98.4 F (36.9 C) (Temporal)   Resp 18   Ht 6\' 2"  (1.88 m)   Wt (!) 423 lb (191.9 kg)   SpO2 92%   BMI 54.31 kg/m   Tumescent Anesthesia: 500 cc 0.9% NaCl with 50 cc Lidocaine HCL 1%  and 15 cc 8.4% NaHCO3  Local Anesthesia: 5 cc Lidocaine HCL and NaHCO3 (ratio 2:1)  7 watts continuous mode     Total energy: 1975 Joules    Total time: 282 seconds Treatment Length  46 cm  Laser Fiber Ref. #  [id number]                             Lot #  [id number]     Patient tolerated procedure well  Notes: Patient wore face mask.  All staff members wore facial masks and facial shields/goggles.  Ativan 1 mg two tablets taken by Lawrence [name] on 08-20-2020 at 9:00 AM.  Procedure performed in Vascular Lab #2 on a bed that safely accomodates  Lawrence [name]' weight.  Description of Procedure:  After marking the course of the secondary varicosities, the patient was placed on the operating table in the supine position, and the right leg was prepped and draped in sterile fashion.   Local anesthetic was administered and under ultrasound guidance the saphenous vein was accessed with a micro needle and guide wire; then the mirco puncture sheath was placed.  A guide wire was inserted saphenofemoral junction , followed by a 5 french sheath.  The position of the sheath and then the laser fiber below the junction was confirmed using the ultrasound.  Tumescent anesthesia was administered along the course of the saphenous vein using ultrasound guidance. The patient was placed in Trendelenburg position and protective laser glasses were placed on patient and staff, and the laser was fired at 7 watts continuous mode for a total of 1975  joules.       Steri strip was applied to the IV insertion site and ABD pads compression bandage were applied.  Ace wrap bandages were applied over the right thigh and at the top of the saphenofemoral junction. Blood loss was less than 15 cc.  Discharge instructions reviewed with patient and hardcopy of discharge instructions given to patient to take home. The patient rode out of the operating room in electric/motorized scooter having tolerated the procedure well.

## 2020-08-25 DIAGNOSIS — G4700 Insomnia, unspecified: Secondary | ICD-10-CM | POA: Diagnosis not present

## 2020-08-25 DIAGNOSIS — M109 Gout, unspecified: Secondary | ICD-10-CM | POA: Diagnosis not present

## 2020-08-25 DIAGNOSIS — I130 Hypertensive heart and chronic kidney disease with heart failure and stage 1 through stage 4 chronic kidney disease, or unspecified chronic kidney disease: Secondary | ICD-10-CM | POA: Diagnosis not present

## 2020-08-25 DIAGNOSIS — I5033 Acute on chronic diastolic (congestive) heart failure: Secondary | ICD-10-CM | POA: Diagnosis not present

## 2020-08-25 DIAGNOSIS — N183 Chronic kidney disease, stage 3 unspecified: Secondary | ICD-10-CM | POA: Diagnosis not present

## 2020-08-25 DIAGNOSIS — E1122 Type 2 diabetes mellitus with diabetic chronic kidney disease: Secondary | ICD-10-CM | POA: Diagnosis not present

## 2020-08-25 DIAGNOSIS — Z79899 Other long term (current) drug therapy: Secondary | ICD-10-CM | POA: Diagnosis not present

## 2020-08-25 DIAGNOSIS — E1151 Type 2 diabetes mellitus with diabetic peripheral angiopathy without gangrene: Secondary | ICD-10-CM | POA: Diagnosis not present

## 2020-08-25 DIAGNOSIS — Z4800 Encounter for change or removal of nonsurgical wound dressing: Secondary | ICD-10-CM | POA: Diagnosis not present

## 2020-08-25 DIAGNOSIS — J449 Chronic obstructive pulmonary disease, unspecified: Secondary | ICD-10-CM | POA: Diagnosis not present

## 2020-08-25 DIAGNOSIS — Z7984 Long term (current) use of oral hypoglycemic drugs: Secondary | ICD-10-CM | POA: Diagnosis not present

## 2020-08-25 DIAGNOSIS — I872 Venous insufficiency (chronic) (peripheral): Secondary | ICD-10-CM | POA: Diagnosis not present

## 2020-08-25 DIAGNOSIS — E1140 Type 2 diabetes mellitus with diabetic neuropathy, unspecified: Secondary | ICD-10-CM | POA: Diagnosis not present

## 2020-08-25 DIAGNOSIS — E785 Hyperlipidemia, unspecified: Secondary | ICD-10-CM | POA: Diagnosis not present

## 2020-08-25 DIAGNOSIS — I2510 Atherosclerotic heart disease of native coronary artery without angina pectoris: Secondary | ICD-10-CM | POA: Diagnosis not present

## 2020-08-25 DIAGNOSIS — Z87891 Personal history of nicotine dependence: Secondary | ICD-10-CM | POA: Diagnosis not present

## 2020-08-25 DIAGNOSIS — L97922 Non-pressure chronic ulcer of unspecified part of left lower leg with fat layer exposed: Secondary | ICD-10-CM | POA: Diagnosis not present

## 2020-08-25 DIAGNOSIS — G4733 Obstructive sleep apnea (adult) (pediatric): Secondary | ICD-10-CM | POA: Diagnosis not present

## 2020-08-25 DIAGNOSIS — I471 Supraventricular tachycardia: Secondary | ICD-10-CM | POA: Diagnosis not present

## 2020-08-27 ENCOUNTER — Other Ambulatory Visit: Payer: Self-pay | Admitting: Nephrology

## 2020-08-27 ENCOUNTER — Encounter: Payer: Self-pay | Admitting: Vascular Surgery

## 2020-08-27 ENCOUNTER — Other Ambulatory Visit: Payer: Self-pay

## 2020-08-27 ENCOUNTER — Ambulatory Visit: Payer: Medicare Other | Admitting: Vascular Surgery

## 2020-08-27 ENCOUNTER — Ambulatory Visit (HOSPITAL_COMMUNITY)
Admission: RE | Admit: 2020-08-27 | Discharge: 2020-08-27 | Disposition: A | Discharge status: Home / Self Care | Payer: Medicare Other | Source: Ambulatory Visit | Attending: Vascular Surgery | Admitting: Vascular Surgery

## 2020-08-27 VITALS — BP 134/84 | HR 94 | Temp 97.9°F | Resp 18

## 2020-08-27 DIAGNOSIS — I83012 Varicose veins of right lower extremity with ulcer of calf: Secondary | ICD-10-CM

## 2020-08-27 DIAGNOSIS — I10 Essential (primary) hypertension: Secondary | ICD-10-CM | POA: Diagnosis not present

## 2020-08-27 DIAGNOSIS — G4700 Insomnia, unspecified: Secondary | ICD-10-CM | POA: Diagnosis not present

## 2020-08-27 DIAGNOSIS — I872 Venous insufficiency (chronic) (peripheral): Secondary | ICD-10-CM

## 2020-08-27 DIAGNOSIS — L97219 Non-pressure chronic ulcer of right calf with unspecified severity: Secondary | ICD-10-CM | POA: Diagnosis present

## 2020-08-27 DIAGNOSIS — E785 Hyperlipidemia, unspecified: Secondary | ICD-10-CM | POA: Diagnosis not present

## 2020-08-27 DIAGNOSIS — J449 Chronic obstructive pulmonary disease, unspecified: Secondary | ICD-10-CM | POA: Diagnosis not present

## 2020-08-27 DIAGNOSIS — N281 Cyst of kidney, acquired: Secondary | ICD-10-CM

## 2020-08-27 DIAGNOSIS — I5032 Chronic diastolic (congestive) heart failure: Secondary | ICD-10-CM | POA: Diagnosis not present

## 2020-08-27 DIAGNOSIS — E1169 Type 2 diabetes mellitus with other specified complication: Secondary | ICD-10-CM | POA: Diagnosis not present

## 2020-08-27 DIAGNOSIS — E1165 Type 2 diabetes mellitus with hyperglycemia: Secondary | ICD-10-CM | POA: Diagnosis not present

## 2020-08-27 DIAGNOSIS — M1990 Unspecified osteoarthritis, unspecified site: Secondary | ICD-10-CM | POA: Diagnosis not present

## 2020-08-27 NOTE — Progress Notes (Signed)
Patient name: Lawrence [name] MRN: [id number] DOB: [birth year] Sex: male  REASON FOR VISIT: Follow-up after endovenous laser ablation of the right great saphenous vein  HPI: Lawrence [name] is a 65 y.o. male who presented with CEAP C6 venous disease. He had no evidence of arterial insufficiency. We discussed conservative measures. He had significant superficial venous reflux and on 08/20/2020 underwent laser ablation of the right great saphenous vein from 2.5 cm distal to the saphenofemoral junction to below the knee.   He tells me that the wounds on his right legs are improving already.  He will continue his dressing changes.  He has some superficial ulcerations on the left.  Of note his previous reflux study on the left showed deep venous reflux but no significant superficial venous reflux.  He has had minimal bruising and no significant pain.  Current Outpatient Medications  Medication Sig Dispense Refill  . acetaminophen (TYLENOL) 650 MG CR tablet Take 1,300 mg by mouth every 8 (eight) hours as needed for pain.    [name] albuterol (VENTOLIN HFA) 108 (90 Base) MCG/ACT inhaler Inhale into the lungs.    [name] allopurinol (ZYLOPRIM) 100 MG tablet Take 200 mg by mouth daily.    . ARIPiprazole (ABILIFY) 5 MG tablet Take 5 mg by mouth at bedtime.     [name] atorvastatin (LIPITOR) 40 MG tablet TAKE ONE TABLET BY MOUTH DAILY AT 6PM 90 tablet 2  . blood glucose meter kit and supplies Dispense based on patient and insurance preference. Use up to four times daily as directed. (FOR ICD-10 E10.9, E11.9). 1 each 0  . cetirizine (ZYRTEC) 10 MG tablet Take 10 mg by mouth at bedtime.     . Dulaglutide (TRULICITY) 3 XH/3.7JI SOPN Inject 3 mg into the skin once a week.    . ferrous sulfate 324 (65 Fe) MG TBEC Take 324 mg by mouth daily with breakfast.     . fluticasone (FLONASE) 50 MCG/ACT nasal spray Place 2 sprays into both nostrils daily.     . furosemide (LASIX) 80 MG tablet Take 2 tablets (160 mg total) every morning  and 1 tablet (80 mg total) every evening. 270 tablet 3  . lisinopril (ZESTRIL) 20 MG tablet Take 1 tablet (20 mg total) by mouth daily. 90 tablet 3  . loperamide (IMODIUM) 2 MG capsule Take 2 mg by mouth 2 (two) times a day.    [name] LORazepam (ATIVAN) 1 MG tablet Take 2 tablets 30 minutes prior to leaving house on day of procedure and bring third tablet with you to office. 3 tablet 0  . metFORMIN (GLUCOPHAGE) 1000 MG tablet Take 1,000 mg by mouth 2 (two) times daily with a meal.    . metoprolol succinate (TOPROL-XL) 100 MG 24 hr tablet Take 1 tablet (100 mg total) by mouth in the morning and at bedtime. Take with or immediately following a meal. 90 tablet 3  . omeprazole (PRILOSEC) 20 MG capsule Take 20 mg by mouth every morning.    [name] oxyCODONE (OXY IR/ROXICODONE) 5 MG immediate release tablet Take 5 mg by mouth in the morning and at bedtime.    . prazosin (MINIPRESS) 2 MG capsule Take 4 mg by mouth at bedtime.    . pregabalin (LYRICA) 100 MG capsule Take 100 mg by mouth in the morning, at noon, and at bedtime.     . propranolol (INDERAL) 10 MG tablet TAKE ONE TABLET BY MOUTH AS NEEDED 10 tablet 10  . silver sulfADIAZINE (SILVADENE) 1 % cream Apply  1 application topically See admin instructions. Apply as directed once a day as directed to bilateral legs    . spironolactone (ALDACTONE) 25 MG tablet Take 1 tablet (25 mg total) by mouth daily. 10 tablet 0  . temazepam (RESTORIL) 15 MG capsule Take 15 mg by mouth at bedtime.     . traZODone (DESYREL) 100 MG tablet Take 200 mg by mouth at bedtime.    Loura Pardon Salicylate (ASPERCREME EX) Apply 1 application topically 2 (two) times daily as needed (pain and inflammation).    . venlafaxine XR (EFFEXOR-XR) 150 MG 24 hr capsule Take 150 mg by mouth daily with breakfast.    . Vitamin D, Ergocalciferol, (DRISDOL) 1.25 MG (50000 UNIT) CAPS capsule Take 50,000 Units by mouth every 7 (seven) days.    . colchicine 0.6 MG tablet Take 0.6 mg by mouth as needed  (gout).  (Patient not taking: No sig reported)     No current facility-administered medications for this visit.   REVIEW OF SYSTEMS: Valu.Nieves ] denotes positive finding; [  ] denotes negative finding  CARDIOVASCULAR:  $RemoveBeforeDE'[ ]'IvlTsrefEFChFQD$  chest pain   '[ ]'$  dyspnea on exertion  $RemoveB'[ ]'TsQeClmo$  leg swelling  CONSTITUTIONAL:  $RemoveBeforeDE'[ ]'qgTyWrzcJWnNOVm$  fever   '[ ]'$  chills  PHYSICAL EXAM: Vitals:   08/27/20 1012  BP: 134/84  Pulse: 94  Resp: 18  Temp: 97.9 F (36.6 C)  TempSrc: Temporal  SpO2: 94%   GENERAL: The patient is a well-nourished male, in no acute distress. The vital signs are documented above. CARDIOVASCULAR: There is a regular rate and rhythm. PULMONARY: There is good air exchange bilaterally without wheezing or rales. VASCULAR: HE HAS NO SIGNIFICANT BRUISING IN THE MEDIAL THIGH ON THE RIGHT LEG.  HE HAS DRESSINGS ON BOTH LEGS.  DATA:  VENOUS DUPLEX: I have independently interpreted his venous duplex scan.  There is no evidence of DVT in the right leg.  The right great saphenous vein successfully closed from the proximal thigh to the distal thigh.     MEDICAL ISSUES:  STATUS POST LASER ABLATION RIGHT GREAT SAPHENOUS VEIN: The patient is doing well status post laser ablation right great saphenous vein for CEAP C6 venous disease.  His wounds he states have already shown some improvement.  I have encouraged him to continue to elevate his legs and continue with aggressive wound care.  We have also previously discussed the importance of maintaining a healthy weight and exercise.  I will plan on seeing him back in 6 months with a follow-up reflux study on the left.  He has significant deep venous reflux there but in the past did not have significant superficial venous reflux.  However with persistent wounds I think we need to continue to follow this closely.  [name] [facility] [phone]

## 2020-08-28 ENCOUNTER — Other Ambulatory Visit: Payer: Self-pay

## 2020-08-28 DIAGNOSIS — I872 Venous insufficiency (chronic) (peripheral): Secondary | ICD-10-CM

## 2020-09-01 DIAGNOSIS — G4733 Obstructive sleep apnea (adult) (pediatric): Secondary | ICD-10-CM | POA: Diagnosis not present

## 2020-09-01 DIAGNOSIS — M109 Gout, unspecified: Secondary | ICD-10-CM | POA: Diagnosis not present

## 2020-09-01 DIAGNOSIS — Z79899 Other long term (current) drug therapy: Secondary | ICD-10-CM | POA: Diagnosis not present

## 2020-09-01 DIAGNOSIS — Z7984 Long term (current) use of oral hypoglycemic drugs: Secondary | ICD-10-CM | POA: Diagnosis not present

## 2020-09-01 DIAGNOSIS — G4700 Insomnia, unspecified: Secondary | ICD-10-CM | POA: Diagnosis not present

## 2020-09-01 DIAGNOSIS — Z87891 Personal history of nicotine dependence: Secondary | ICD-10-CM | POA: Diagnosis not present

## 2020-09-01 DIAGNOSIS — L97922 Non-pressure chronic ulcer of unspecified part of left lower leg with fat layer exposed: Secondary | ICD-10-CM | POA: Diagnosis not present

## 2020-09-01 DIAGNOSIS — E1140 Type 2 diabetes mellitus with diabetic neuropathy, unspecified: Secondary | ICD-10-CM | POA: Diagnosis not present

## 2020-09-01 DIAGNOSIS — E1122 Type 2 diabetes mellitus with diabetic chronic kidney disease: Secondary | ICD-10-CM | POA: Diagnosis not present

## 2020-09-01 DIAGNOSIS — I130 Hypertensive heart and chronic kidney disease with heart failure and stage 1 through stage 4 chronic kidney disease, or unspecified chronic kidney disease: Secondary | ICD-10-CM | POA: Diagnosis not present

## 2020-09-01 DIAGNOSIS — I872 Venous insufficiency (chronic) (peripheral): Secondary | ICD-10-CM | POA: Diagnosis not present

## 2020-09-01 DIAGNOSIS — Z4800 Encounter for change or removal of nonsurgical wound dressing: Secondary | ICD-10-CM | POA: Diagnosis not present

## 2020-09-01 DIAGNOSIS — I471 Supraventricular tachycardia: Secondary | ICD-10-CM | POA: Diagnosis not present

## 2020-09-01 DIAGNOSIS — E785 Hyperlipidemia, unspecified: Secondary | ICD-10-CM | POA: Diagnosis not present

## 2020-09-01 DIAGNOSIS — E1151 Type 2 diabetes mellitus with diabetic peripheral angiopathy without gangrene: Secondary | ICD-10-CM | POA: Diagnosis not present

## 2020-09-01 DIAGNOSIS — I2510 Atherosclerotic heart disease of native coronary artery without angina pectoris: Secondary | ICD-10-CM | POA: Diagnosis not present

## 2020-09-01 DIAGNOSIS — N183 Chronic kidney disease, stage 3 unspecified: Secondary | ICD-10-CM | POA: Diagnosis not present

## 2020-09-01 DIAGNOSIS — J449 Chronic obstructive pulmonary disease, unspecified: Secondary | ICD-10-CM | POA: Diagnosis not present

## 2020-09-01 DIAGNOSIS — I5033 Acute on chronic diastolic (congestive) heart failure: Secondary | ICD-10-CM | POA: Diagnosis not present

## 2020-09-08 DIAGNOSIS — I872 Venous insufficiency (chronic) (peripheral): Secondary | ICD-10-CM | POA: Diagnosis not present

## 2020-09-08 DIAGNOSIS — E785 Hyperlipidemia, unspecified: Secondary | ICD-10-CM | POA: Diagnosis not present

## 2020-09-08 DIAGNOSIS — N183 Chronic kidney disease, stage 3 unspecified: Secondary | ICD-10-CM | POA: Diagnosis not present

## 2020-09-08 DIAGNOSIS — M109 Gout, unspecified: Secondary | ICD-10-CM | POA: Diagnosis not present

## 2020-09-08 DIAGNOSIS — Z7984 Long term (current) use of oral hypoglycemic drugs: Secondary | ICD-10-CM | POA: Diagnosis not present

## 2020-09-08 DIAGNOSIS — J449 Chronic obstructive pulmonary disease, unspecified: Secondary | ICD-10-CM | POA: Diagnosis not present

## 2020-09-08 DIAGNOSIS — E1140 Type 2 diabetes mellitus with diabetic neuropathy, unspecified: Secondary | ICD-10-CM | POA: Diagnosis not present

## 2020-09-08 DIAGNOSIS — I2510 Atherosclerotic heart disease of native coronary artery without angina pectoris: Secondary | ICD-10-CM | POA: Diagnosis not present

## 2020-09-08 DIAGNOSIS — E1122 Type 2 diabetes mellitus with diabetic chronic kidney disease: Secondary | ICD-10-CM | POA: Diagnosis not present

## 2020-09-08 DIAGNOSIS — Z87891 Personal history of nicotine dependence: Secondary | ICD-10-CM | POA: Diagnosis not present

## 2020-09-08 DIAGNOSIS — I471 Supraventricular tachycardia: Secondary | ICD-10-CM | POA: Diagnosis not present

## 2020-09-08 DIAGNOSIS — I130 Hypertensive heart and chronic kidney disease with heart failure and stage 1 through stage 4 chronic kidney disease, or unspecified chronic kidney disease: Secondary | ICD-10-CM | POA: Diagnosis not present

## 2020-09-08 DIAGNOSIS — G4733 Obstructive sleep apnea (adult) (pediatric): Secondary | ICD-10-CM | POA: Diagnosis not present

## 2020-09-08 DIAGNOSIS — Z79899 Other long term (current) drug therapy: Secondary | ICD-10-CM | POA: Diagnosis not present

## 2020-09-08 DIAGNOSIS — E1151 Type 2 diabetes mellitus with diabetic peripheral angiopathy without gangrene: Secondary | ICD-10-CM | POA: Diagnosis not present

## 2020-09-08 DIAGNOSIS — I5033 Acute on chronic diastolic (congestive) heart failure: Secondary | ICD-10-CM | POA: Diagnosis not present

## 2020-09-08 DIAGNOSIS — G4700 Insomnia, unspecified: Secondary | ICD-10-CM | POA: Diagnosis not present

## 2020-09-08 DIAGNOSIS — Z4800 Encounter for change or removal of nonsurgical wound dressing: Secondary | ICD-10-CM | POA: Diagnosis not present

## 2020-09-08 DIAGNOSIS — L97922 Non-pressure chronic ulcer of unspecified part of left lower leg with fat layer exposed: Secondary | ICD-10-CM | POA: Diagnosis not present

## 2020-09-12 ENCOUNTER — Other Ambulatory Visit: Payer: Self-pay

## 2020-09-12 ENCOUNTER — Ambulatory Visit
Admission: RE | Admit: 2020-09-12 | Discharge: 2020-09-12 | Disposition: A | Discharge status: Home / Self Care | Payer: Medicare Other | Source: Ambulatory Visit | Attending: Nephrology | Admitting: Nephrology

## 2020-09-12 DIAGNOSIS — N281 Cyst of kidney, acquired: Secondary | ICD-10-CM

## 2020-09-15 DIAGNOSIS — G4700 Insomnia, unspecified: Secondary | ICD-10-CM | POA: Diagnosis not present

## 2020-09-15 DIAGNOSIS — E1122 Type 2 diabetes mellitus with diabetic chronic kidney disease: Secondary | ICD-10-CM | POA: Diagnosis not present

## 2020-09-15 DIAGNOSIS — N183 Chronic kidney disease, stage 3 unspecified: Secondary | ICD-10-CM | POA: Diagnosis not present

## 2020-09-15 DIAGNOSIS — E1151 Type 2 diabetes mellitus with diabetic peripheral angiopathy without gangrene: Secondary | ICD-10-CM | POA: Diagnosis not present

## 2020-09-15 DIAGNOSIS — E785 Hyperlipidemia, unspecified: Secondary | ICD-10-CM | POA: Diagnosis not present

## 2020-09-15 DIAGNOSIS — Z79899 Other long term (current) drug therapy: Secondary | ICD-10-CM | POA: Diagnosis not present

## 2020-09-15 DIAGNOSIS — I471 Supraventricular tachycardia: Secondary | ICD-10-CM | POA: Diagnosis not present

## 2020-09-15 DIAGNOSIS — I872 Venous insufficiency (chronic) (peripheral): Secondary | ICD-10-CM | POA: Diagnosis not present

## 2020-09-15 DIAGNOSIS — Z4800 Encounter for change or removal of nonsurgical wound dressing: Secondary | ICD-10-CM | POA: Diagnosis not present

## 2020-09-15 DIAGNOSIS — I5033 Acute on chronic diastolic (congestive) heart failure: Secondary | ICD-10-CM | POA: Diagnosis not present

## 2020-09-15 DIAGNOSIS — Z87891 Personal history of nicotine dependence: Secondary | ICD-10-CM | POA: Diagnosis not present

## 2020-09-15 DIAGNOSIS — M109 Gout, unspecified: Secondary | ICD-10-CM | POA: Diagnosis not present

## 2020-09-15 DIAGNOSIS — I2510 Atherosclerotic heart disease of native coronary artery without angina pectoris: Secondary | ICD-10-CM | POA: Diagnosis not present

## 2020-09-15 DIAGNOSIS — Z7984 Long term (current) use of oral hypoglycemic drugs: Secondary | ICD-10-CM | POA: Diagnosis not present

## 2020-09-15 DIAGNOSIS — E1140 Type 2 diabetes mellitus with diabetic neuropathy, unspecified: Secondary | ICD-10-CM | POA: Diagnosis not present

## 2020-09-15 DIAGNOSIS — L97922 Non-pressure chronic ulcer of unspecified part of left lower leg with fat layer exposed: Secondary | ICD-10-CM | POA: Diagnosis not present

## 2020-09-15 DIAGNOSIS — J449 Chronic obstructive pulmonary disease, unspecified: Secondary | ICD-10-CM | POA: Diagnosis not present

## 2020-09-15 DIAGNOSIS — G4733 Obstructive sleep apnea (adult) (pediatric): Secondary | ICD-10-CM | POA: Diagnosis not present

## 2020-09-15 DIAGNOSIS — I130 Hypertensive heart and chronic kidney disease with heart failure and stage 1 through stage 4 chronic kidney disease, or unspecified chronic kidney disease: Secondary | ICD-10-CM | POA: Diagnosis not present

## 2020-09-17 DIAGNOSIS — M792 Neuralgia and neuritis, unspecified: Secondary | ICD-10-CM | POA: Diagnosis not present

## 2020-09-22 DIAGNOSIS — Z79899 Other long term (current) drug therapy: Secondary | ICD-10-CM | POA: Diagnosis not present

## 2020-09-22 DIAGNOSIS — I2510 Atherosclerotic heart disease of native coronary artery without angina pectoris: Secondary | ICD-10-CM | POA: Diagnosis not present

## 2020-09-22 DIAGNOSIS — N183 Chronic kidney disease, stage 3 unspecified: Secondary | ICD-10-CM | POA: Diagnosis not present

## 2020-09-22 DIAGNOSIS — E1151 Type 2 diabetes mellitus with diabetic peripheral angiopathy without gangrene: Secondary | ICD-10-CM | POA: Diagnosis not present

## 2020-09-22 DIAGNOSIS — L97922 Non-pressure chronic ulcer of unspecified part of left lower leg with fat layer exposed: Secondary | ICD-10-CM | POA: Diagnosis not present

## 2020-09-22 DIAGNOSIS — Z4800 Encounter for change or removal of nonsurgical wound dressing: Secondary | ICD-10-CM | POA: Diagnosis not present

## 2020-09-22 DIAGNOSIS — J449 Chronic obstructive pulmonary disease, unspecified: Secondary | ICD-10-CM | POA: Diagnosis not present

## 2020-09-22 DIAGNOSIS — I872 Venous insufficiency (chronic) (peripheral): Secondary | ICD-10-CM | POA: Diagnosis not present

## 2020-09-22 DIAGNOSIS — Z7984 Long term (current) use of oral hypoglycemic drugs: Secondary | ICD-10-CM | POA: Diagnosis not present

## 2020-09-22 DIAGNOSIS — M109 Gout, unspecified: Secondary | ICD-10-CM | POA: Diagnosis not present

## 2020-09-22 DIAGNOSIS — E785 Hyperlipidemia, unspecified: Secondary | ICD-10-CM | POA: Diagnosis not present

## 2020-09-22 DIAGNOSIS — E1122 Type 2 diabetes mellitus with diabetic chronic kidney disease: Secondary | ICD-10-CM | POA: Diagnosis not present

## 2020-09-22 DIAGNOSIS — I5033 Acute on chronic diastolic (congestive) heart failure: Secondary | ICD-10-CM | POA: Diagnosis not present

## 2020-09-22 DIAGNOSIS — I471 Supraventricular tachycardia: Secondary | ICD-10-CM | POA: Diagnosis not present

## 2020-09-22 DIAGNOSIS — G4733 Obstructive sleep apnea (adult) (pediatric): Secondary | ICD-10-CM | POA: Diagnosis not present

## 2020-09-22 DIAGNOSIS — E1140 Type 2 diabetes mellitus with diabetic neuropathy, unspecified: Secondary | ICD-10-CM | POA: Diagnosis not present

## 2020-09-22 DIAGNOSIS — I130 Hypertensive heart and chronic kidney disease with heart failure and stage 1 through stage 4 chronic kidney disease, or unspecified chronic kidney disease: Secondary | ICD-10-CM | POA: Diagnosis not present

## 2020-09-22 DIAGNOSIS — Z87891 Personal history of nicotine dependence: Secondary | ICD-10-CM | POA: Diagnosis not present

## 2020-09-22 DIAGNOSIS — G4700 Insomnia, unspecified: Secondary | ICD-10-CM | POA: Diagnosis not present

## 2020-09-25 ENCOUNTER — Other Ambulatory Visit: Payer: Self-pay

## 2020-09-25 ENCOUNTER — Ambulatory Visit (INDEPENDENT_AMBULATORY_CARE_PROVIDER_SITE_OTHER): Payer: Medicare Other | Admitting: Podiatry

## 2020-09-25 ENCOUNTER — Encounter: Payer: Self-pay | Admitting: Podiatry

## 2020-09-25 DIAGNOSIS — E1142 Type 2 diabetes mellitus with diabetic polyneuropathy: Secondary | ICD-10-CM | POA: Diagnosis not present

## 2020-09-25 DIAGNOSIS — M79675 Pain in left toe(s): Secondary | ICD-10-CM | POA: Diagnosis not present

## 2020-09-25 DIAGNOSIS — B351 Tinea unguium: Secondary | ICD-10-CM

## 2020-09-25 DIAGNOSIS — M79674 Pain in right toe(s): Secondary | ICD-10-CM | POA: Diagnosis not present

## 2020-09-25 DIAGNOSIS — I878 Other specified disorders of veins: Secondary | ICD-10-CM

## 2020-09-25 NOTE — Progress Notes (Signed)
This patient returns to my office for at risk foot care.  This patient requires this care by a professional since this patient will be at risk due to having diabetic neuropathy and angiopathy and venous stasis.  . This patient presents to the office with his wife.This patient is unable to cut nails himself since the patient cannot reach his nails.These nails are painful walking and wearing shoes.  This patient presents for at risk foot care today.  General Appearance  Alert, conversant and in no acute stress.  Vascular  Dorsalis pedis and posterior tibial  pulses are not  palpable  billaterally due to severe swelling feet.  Capillary return is within normal limits  bilaterally. Temperature is within normal limits  Bilaterally. Venous stasis right leg.  Neurologic  Senn-Weinstein monofilament wire test absent right foot.  Diminished left foot.. Muscle power within normal limits bilaterally.  Nails Thick disfigured discolored nails with subungual debris  from hallux to fifth toes bilaterally.  Pincer hallux nails   B/L. No evidence of bacterial infection or drainage bilaterally.  Orthopedic  No limitations of motion  feet .  No crepitus or effusions noted.  No bony pathology or digital deformities noted.  Severe HAV  B/L.  Skin  normotropic skin with no porokeratosis noted bilaterally.  No signs of infections or ulcers noted.     Onychomycosis  Pain in right toes  Pain in left toes  Consent was obtained for treatment procedures.   Mechanical debridement of nails 1-5  bilaterally performed with a nail nipper.  Filed with dremel without incident.    Return office visit   3 months                   Told patient to return for periodic foot care and evaluation due to potential at risk complications.   Gardiner Barefoot DPM

## 2020-09-29 DIAGNOSIS — E1122 Type 2 diabetes mellitus with diabetic chronic kidney disease: Secondary | ICD-10-CM | POA: Diagnosis not present

## 2020-09-29 DIAGNOSIS — Z87891 Personal history of nicotine dependence: Secondary | ICD-10-CM | POA: Diagnosis not present

## 2020-09-29 DIAGNOSIS — I5033 Acute on chronic diastolic (congestive) heart failure: Secondary | ICD-10-CM | POA: Diagnosis not present

## 2020-09-29 DIAGNOSIS — M109 Gout, unspecified: Secondary | ICD-10-CM | POA: Diagnosis not present

## 2020-09-29 DIAGNOSIS — E785 Hyperlipidemia, unspecified: Secondary | ICD-10-CM | POA: Diagnosis not present

## 2020-09-29 DIAGNOSIS — Z4800 Encounter for change or removal of nonsurgical wound dressing: Secondary | ICD-10-CM | POA: Diagnosis not present

## 2020-09-29 DIAGNOSIS — Z7984 Long term (current) use of oral hypoglycemic drugs: Secondary | ICD-10-CM | POA: Diagnosis not present

## 2020-09-29 DIAGNOSIS — Z79899 Other long term (current) drug therapy: Secondary | ICD-10-CM | POA: Diagnosis not present

## 2020-09-29 DIAGNOSIS — N183 Chronic kidney disease, stage 3 unspecified: Secondary | ICD-10-CM | POA: Diagnosis not present

## 2020-09-29 DIAGNOSIS — G4700 Insomnia, unspecified: Secondary | ICD-10-CM | POA: Diagnosis not present

## 2020-09-29 DIAGNOSIS — E1151 Type 2 diabetes mellitus with diabetic peripheral angiopathy without gangrene: Secondary | ICD-10-CM | POA: Diagnosis not present

## 2020-09-29 DIAGNOSIS — I471 Supraventricular tachycardia: Secondary | ICD-10-CM | POA: Diagnosis not present

## 2020-09-29 DIAGNOSIS — I130 Hypertensive heart and chronic kidney disease with heart failure and stage 1 through stage 4 chronic kidney disease, or unspecified chronic kidney disease: Secondary | ICD-10-CM | POA: Diagnosis not present

## 2020-09-29 DIAGNOSIS — G4733 Obstructive sleep apnea (adult) (pediatric): Secondary | ICD-10-CM | POA: Diagnosis not present

## 2020-09-29 DIAGNOSIS — I872 Venous insufficiency (chronic) (peripheral): Secondary | ICD-10-CM | POA: Diagnosis not present

## 2020-09-29 DIAGNOSIS — L97922 Non-pressure chronic ulcer of unspecified part of left lower leg with fat layer exposed: Secondary | ICD-10-CM | POA: Diagnosis not present

## 2020-09-29 DIAGNOSIS — E1140 Type 2 diabetes mellitus with diabetic neuropathy, unspecified: Secondary | ICD-10-CM | POA: Diagnosis not present

## 2020-09-29 DIAGNOSIS — I2510 Atherosclerotic heart disease of native coronary artery without angina pectoris: Secondary | ICD-10-CM | POA: Diagnosis not present

## 2020-09-29 DIAGNOSIS — J449 Chronic obstructive pulmonary disease, unspecified: Secondary | ICD-10-CM | POA: Diagnosis not present

## 2020-10-06 DIAGNOSIS — E1122 Type 2 diabetes mellitus with diabetic chronic kidney disease: Secondary | ICD-10-CM | POA: Diagnosis not present

## 2020-10-06 DIAGNOSIS — I471 Supraventricular tachycardia: Secondary | ICD-10-CM | POA: Diagnosis not present

## 2020-10-06 DIAGNOSIS — I5033 Acute on chronic diastolic (congestive) heart failure: Secondary | ICD-10-CM | POA: Diagnosis not present

## 2020-10-06 DIAGNOSIS — Z7984 Long term (current) use of oral hypoglycemic drugs: Secondary | ICD-10-CM | POA: Diagnosis not present

## 2020-10-06 DIAGNOSIS — Z4800 Encounter for change or removal of nonsurgical wound dressing: Secondary | ICD-10-CM | POA: Diagnosis not present

## 2020-10-06 DIAGNOSIS — E1151 Type 2 diabetes mellitus with diabetic peripheral angiopathy without gangrene: Secondary | ICD-10-CM | POA: Diagnosis not present

## 2020-10-06 DIAGNOSIS — N183 Chronic kidney disease, stage 3 unspecified: Secondary | ICD-10-CM | POA: Diagnosis not present

## 2020-10-06 DIAGNOSIS — I2510 Atherosclerotic heart disease of native coronary artery without angina pectoris: Secondary | ICD-10-CM | POA: Diagnosis not present

## 2020-10-06 DIAGNOSIS — J449 Chronic obstructive pulmonary disease, unspecified: Secondary | ICD-10-CM | POA: Diagnosis not present

## 2020-10-06 DIAGNOSIS — G4700 Insomnia, unspecified: Secondary | ICD-10-CM | POA: Diagnosis not present

## 2020-10-06 DIAGNOSIS — E1140 Type 2 diabetes mellitus with diabetic neuropathy, unspecified: Secondary | ICD-10-CM | POA: Diagnosis not present

## 2020-10-06 DIAGNOSIS — Z87891 Personal history of nicotine dependence: Secondary | ICD-10-CM | POA: Diagnosis not present

## 2020-10-06 DIAGNOSIS — I130 Hypertensive heart and chronic kidney disease with heart failure and stage 1 through stage 4 chronic kidney disease, or unspecified chronic kidney disease: Secondary | ICD-10-CM | POA: Diagnosis not present

## 2020-10-06 DIAGNOSIS — Z79899 Other long term (current) drug therapy: Secondary | ICD-10-CM | POA: Diagnosis not present

## 2020-10-06 DIAGNOSIS — G4733 Obstructive sleep apnea (adult) (pediatric): Secondary | ICD-10-CM | POA: Diagnosis not present

## 2020-10-06 DIAGNOSIS — M109 Gout, unspecified: Secondary | ICD-10-CM | POA: Diagnosis not present

## 2020-10-06 DIAGNOSIS — I872 Venous insufficiency (chronic) (peripheral): Secondary | ICD-10-CM | POA: Diagnosis not present

## 2020-10-06 DIAGNOSIS — L97922 Non-pressure chronic ulcer of unspecified part of left lower leg with fat layer exposed: Secondary | ICD-10-CM | POA: Diagnosis not present

## 2020-10-06 DIAGNOSIS — E785 Hyperlipidemia, unspecified: Secondary | ICD-10-CM | POA: Diagnosis not present

## 2020-10-13 DIAGNOSIS — J449 Chronic obstructive pulmonary disease, unspecified: Secondary | ICD-10-CM | POA: Diagnosis not present

## 2020-10-13 DIAGNOSIS — I2510 Atherosclerotic heart disease of native coronary artery without angina pectoris: Secondary | ICD-10-CM | POA: Diagnosis not present

## 2020-10-13 DIAGNOSIS — E1122 Type 2 diabetes mellitus with diabetic chronic kidney disease: Secondary | ICD-10-CM | POA: Diagnosis not present

## 2020-10-13 DIAGNOSIS — I5033 Acute on chronic diastolic (congestive) heart failure: Secondary | ICD-10-CM | POA: Diagnosis not present

## 2020-10-13 DIAGNOSIS — I130 Hypertensive heart and chronic kidney disease with heart failure and stage 1 through stage 4 chronic kidney disease, or unspecified chronic kidney disease: Secondary | ICD-10-CM | POA: Diagnosis not present

## 2020-10-13 DIAGNOSIS — Z794 Long term (current) use of insulin: Secondary | ICD-10-CM | POA: Diagnosis not present

## 2020-10-13 DIAGNOSIS — Z4800 Encounter for change or removal of nonsurgical wound dressing: Secondary | ICD-10-CM | POA: Diagnosis not present

## 2020-10-13 DIAGNOSIS — M109 Gout, unspecified: Secondary | ICD-10-CM | POA: Diagnosis not present

## 2020-10-13 DIAGNOSIS — E1151 Type 2 diabetes mellitus with diabetic peripheral angiopathy without gangrene: Secondary | ICD-10-CM | POA: Diagnosis not present

## 2020-10-13 DIAGNOSIS — Z79899 Other long term (current) drug therapy: Secondary | ICD-10-CM | POA: Diagnosis not present

## 2020-10-13 DIAGNOSIS — E1140 Type 2 diabetes mellitus with diabetic neuropathy, unspecified: Secondary | ICD-10-CM | POA: Diagnosis not present

## 2020-10-13 DIAGNOSIS — L97812 Non-pressure chronic ulcer of other part of right lower leg with fat layer exposed: Secondary | ICD-10-CM | POA: Diagnosis not present

## 2020-10-13 DIAGNOSIS — I471 Supraventricular tachycardia: Secondary | ICD-10-CM | POA: Diagnosis not present

## 2020-10-13 DIAGNOSIS — G4700 Insomnia, unspecified: Secondary | ICD-10-CM | POA: Diagnosis not present

## 2020-10-13 DIAGNOSIS — I872 Venous insufficiency (chronic) (peripheral): Secondary | ICD-10-CM | POA: Diagnosis not present

## 2020-10-13 DIAGNOSIS — L97822 Non-pressure chronic ulcer of other part of left lower leg with fat layer exposed: Secondary | ICD-10-CM | POA: Diagnosis not present

## 2020-10-13 DIAGNOSIS — G4733 Obstructive sleep apnea (adult) (pediatric): Secondary | ICD-10-CM | POA: Diagnosis not present

## 2020-10-13 DIAGNOSIS — N183 Chronic kidney disease, stage 3 unspecified: Secondary | ICD-10-CM | POA: Diagnosis not present

## 2020-10-13 DIAGNOSIS — E785 Hyperlipidemia, unspecified: Secondary | ICD-10-CM | POA: Diagnosis not present

## 2020-10-15 ENCOUNTER — Ambulatory Visit: Payer: Medicare Other | Admitting: Endocrinology

## 2020-10-15 ENCOUNTER — Other Ambulatory Visit: Payer: Self-pay | Admitting: Cardiology

## 2020-10-15 DIAGNOSIS — M792 Neuralgia and neuritis, unspecified: Secondary | ICD-10-CM | POA: Diagnosis not present

## 2020-10-16 ENCOUNTER — Emergency Department (HOSPITAL_COMMUNITY): Payer: Medicare Other

## 2020-10-16 ENCOUNTER — Inpatient Hospital Stay (HOSPITAL_COMMUNITY)
Admission: EM | Admit: 2020-10-16 | Discharge: 2020-10-26 | DRG: 291 | Disposition: A | Discharge status: Home Health | Payer: Medicare Other | Attending: Internal Medicine | Admitting: Internal Medicine

## 2020-10-16 DIAGNOSIS — R9431 Abnormal electrocardiogram [ECG] [EKG]: Secondary | ICD-10-CM | POA: Diagnosis present

## 2020-10-16 DIAGNOSIS — I493 Ventricular premature depolarization: Secondary | ICD-10-CM | POA: Diagnosis present

## 2020-10-16 DIAGNOSIS — N400 Enlarged prostate without lower urinary tract symptoms: Secondary | ICD-10-CM | POA: Diagnosis present

## 2020-10-16 DIAGNOSIS — X58XXXA Exposure to other specified factors, initial encounter: Secondary | ICD-10-CM | POA: Diagnosis present

## 2020-10-16 DIAGNOSIS — J9690 Respiratory failure, unspecified, unspecified whether with hypoxia or hypercapnia: Secondary | ICD-10-CM | POA: Diagnosis not present

## 2020-10-16 DIAGNOSIS — I471 Supraventricular tachycardia, unspecified: Secondary | ICD-10-CM

## 2020-10-16 DIAGNOSIS — I10 Essential (primary) hypertension: Secondary | ICD-10-CM | POA: Diagnosis not present

## 2020-10-16 DIAGNOSIS — I5033 Acute on chronic diastolic (congestive) heart failure: Secondary | ICD-10-CM | POA: Diagnosis not present

## 2020-10-16 DIAGNOSIS — R251 Tremor, unspecified: Secondary | ICD-10-CM | POA: Diagnosis present

## 2020-10-16 DIAGNOSIS — I959 Hypotension, unspecified: Secondary | ICD-10-CM | POA: Diagnosis not present

## 2020-10-16 DIAGNOSIS — Z881 Allergy status to other antibiotic agents status: Secondary | ICD-10-CM

## 2020-10-16 DIAGNOSIS — I517 Cardiomegaly: Secondary | ICD-10-CM | POA: Diagnosis not present

## 2020-10-16 DIAGNOSIS — T380X5A Adverse effect of glucocorticoids and synthetic analogues, initial encounter: Secondary | ICD-10-CM | POA: Diagnosis not present

## 2020-10-16 DIAGNOSIS — I878 Other specified disorders of veins: Secondary | ICD-10-CM | POA: Diagnosis present

## 2020-10-16 DIAGNOSIS — Z6841 Body Mass Index (BMI) 40.0 and over, adult: Secondary | ICD-10-CM

## 2020-10-16 DIAGNOSIS — I2510 Atherosclerotic heart disease of native coronary artery without angina pectoris: Secondary | ICD-10-CM | POA: Diagnosis not present

## 2020-10-16 DIAGNOSIS — G4733 Obstructive sleep apnea (adult) (pediatric): Secondary | ICD-10-CM | POA: Diagnosis present

## 2020-10-16 DIAGNOSIS — T82519A Breakdown (mechanical) of unspecified cardiac and vascular devices and implants, initial encounter: Secondary | ICD-10-CM | POA: Diagnosis not present

## 2020-10-16 DIAGNOSIS — M7989 Other specified soft tissue disorders: Secondary | ICD-10-CM | POA: Diagnosis not present

## 2020-10-16 DIAGNOSIS — G40909 Epilepsy, unspecified, not intractable, without status epilepticus: Secondary | ICD-10-CM

## 2020-10-16 DIAGNOSIS — N179 Acute kidney failure, unspecified: Secondary | ICD-10-CM | POA: Diagnosis not present

## 2020-10-16 DIAGNOSIS — E1151 Type 2 diabetes mellitus with diabetic peripheral angiopathy without gangrene: Secondary | ICD-10-CM | POA: Diagnosis not present

## 2020-10-16 DIAGNOSIS — Z86010 Personal history of colonic polyps: Secondary | ICD-10-CM

## 2020-10-16 DIAGNOSIS — L03116 Cellulitis of left lower limb: Secondary | ICD-10-CM | POA: Diagnosis not present

## 2020-10-16 DIAGNOSIS — R279 Unspecified lack of coordination: Secondary | ICD-10-CM | POA: Diagnosis not present

## 2020-10-16 DIAGNOSIS — M1990 Unspecified osteoarthritis, unspecified site: Secondary | ICD-10-CM | POA: Diagnosis not present

## 2020-10-16 DIAGNOSIS — T502X5A Adverse effect of carbonic-anhydrase inhibitors, benzothiadiazides and other diuretics, initial encounter: Secondary | ICD-10-CM | POA: Diagnosis not present

## 2020-10-16 DIAGNOSIS — E871 Hypo-osmolality and hyponatremia: Secondary | ICD-10-CM | POA: Diagnosis not present

## 2020-10-16 DIAGNOSIS — E1140 Type 2 diabetes mellitus with diabetic neuropathy, unspecified: Secondary | ICD-10-CM | POA: Diagnosis not present

## 2020-10-16 DIAGNOSIS — Z20822 Contact with and (suspected) exposure to covid-19: Secondary | ICD-10-CM | POA: Diagnosis not present

## 2020-10-16 DIAGNOSIS — E66813 Obesity, class 3: Secondary | ICD-10-CM | POA: Diagnosis present

## 2020-10-16 DIAGNOSIS — Z7984 Long term (current) use of oral hypoglycemic drugs: Secondary | ICD-10-CM | POA: Diagnosis not present

## 2020-10-16 DIAGNOSIS — I2583 Coronary atherosclerosis due to lipid rich plaque: Secondary | ICD-10-CM | POA: Diagnosis present

## 2020-10-16 DIAGNOSIS — E7800 Pure hypercholesterolemia, unspecified: Secondary | ICD-10-CM | POA: Diagnosis present

## 2020-10-16 DIAGNOSIS — N1831 Chronic kidney disease, stage 3a: Secondary | ICD-10-CM | POA: Diagnosis present

## 2020-10-16 DIAGNOSIS — J449 Chronic obstructive pulmonary disease, unspecified: Secondary | ICD-10-CM | POA: Diagnosis present

## 2020-10-16 DIAGNOSIS — L97812 Non-pressure chronic ulcer of other part of right lower leg with fat layer exposed: Secondary | ICD-10-CM | POA: Diagnosis not present

## 2020-10-16 DIAGNOSIS — E785 Hyperlipidemia, unspecified: Secondary | ICD-10-CM | POA: Diagnosis not present

## 2020-10-16 DIAGNOSIS — M109 Gout, unspecified: Secondary | ICD-10-CM | POA: Diagnosis not present

## 2020-10-16 DIAGNOSIS — E1142 Type 2 diabetes mellitus with diabetic polyneuropathy: Secondary | ICD-10-CM | POA: Diagnosis present

## 2020-10-16 DIAGNOSIS — E1122 Type 2 diabetes mellitus with diabetic chronic kidney disease: Secondary | ICD-10-CM | POA: Diagnosis present

## 2020-10-16 DIAGNOSIS — Z23 Encounter for immunization: Secondary | ICD-10-CM | POA: Diagnosis not present

## 2020-10-16 DIAGNOSIS — N183 Chronic kidney disease, stage 3 unspecified: Secondary | ICD-10-CM | POA: Diagnosis not present

## 2020-10-16 DIAGNOSIS — Z8249 Family history of ischemic heart disease and other diseases of the circulatory system: Secondary | ICD-10-CM

## 2020-10-16 DIAGNOSIS — E1165 Type 2 diabetes mellitus with hyperglycemia: Secondary | ICD-10-CM | POA: Diagnosis not present

## 2020-10-16 DIAGNOSIS — F419 Anxiety disorder, unspecified: Secondary | ICD-10-CM | POA: Diagnosis present

## 2020-10-16 DIAGNOSIS — E876 Hypokalemia: Secondary | ICD-10-CM | POA: Diagnosis not present

## 2020-10-16 DIAGNOSIS — Z66 Do not resuscitate: Secondary | ICD-10-CM | POA: Diagnosis not present

## 2020-10-16 DIAGNOSIS — R6889 Other general symptoms and signs: Secondary | ICD-10-CM | POA: Diagnosis not present

## 2020-10-16 DIAGNOSIS — L97822 Non-pressure chronic ulcer of other part of left lower leg with fat layer exposed: Secondary | ICD-10-CM | POA: Diagnosis not present

## 2020-10-16 DIAGNOSIS — I872 Venous insufficiency (chronic) (peripheral): Secondary | ICD-10-CM | POA: Diagnosis not present

## 2020-10-16 DIAGNOSIS — Z96652 Presence of left artificial knee joint: Secondary | ICD-10-CM | POA: Diagnosis present

## 2020-10-16 DIAGNOSIS — Z794 Long term (current) use of insulin: Secondary | ICD-10-CM | POA: Diagnosis not present

## 2020-10-16 DIAGNOSIS — E1169 Type 2 diabetes mellitus with other specified complication: Secondary | ICD-10-CM | POA: Diagnosis present

## 2020-10-16 DIAGNOSIS — Z87891 Personal history of nicotine dependence: Secondary | ICD-10-CM | POA: Diagnosis not present

## 2020-10-16 DIAGNOSIS — Z743 Need for continuous supervision: Secondary | ICD-10-CM | POA: Diagnosis not present

## 2020-10-16 DIAGNOSIS — Z888 Allergy status to other drugs, medicaments and biological substances status: Secondary | ICD-10-CM

## 2020-10-16 DIAGNOSIS — F32A Depression, unspecified: Secondary | ICD-10-CM | POA: Diagnosis present

## 2020-10-16 DIAGNOSIS — Z79899 Other long term (current) drug therapy: Secondary | ICD-10-CM | POA: Diagnosis not present

## 2020-10-16 DIAGNOSIS — L03115 Cellulitis of right lower limb: Secondary | ICD-10-CM | POA: Diagnosis present

## 2020-10-16 DIAGNOSIS — J9601 Acute respiratory failure with hypoxia: Secondary | ICD-10-CM | POA: Diagnosis not present

## 2020-10-16 DIAGNOSIS — Y92239 Unspecified place in hospital as the place of occurrence of the external cause: Secondary | ICD-10-CM | POA: Diagnosis not present

## 2020-10-16 DIAGNOSIS — R0602 Shortness of breath: Secondary | ICD-10-CM | POA: Diagnosis not present

## 2020-10-16 DIAGNOSIS — E669 Obesity, unspecified: Secondary | ICD-10-CM | POA: Diagnosis present

## 2020-10-16 DIAGNOSIS — I350 Nonrheumatic aortic (valve) stenosis: Secondary | ICD-10-CM | POA: Diagnosis present

## 2020-10-16 DIAGNOSIS — R32 Unspecified urinary incontinence: Secondary | ICD-10-CM | POA: Diagnosis present

## 2020-10-16 DIAGNOSIS — G4700 Insomnia, unspecified: Secondary | ICD-10-CM | POA: Diagnosis not present

## 2020-10-16 DIAGNOSIS — I130 Hypertensive heart and chronic kidney disease with heart failure and stage 1 through stage 4 chronic kidney disease, or unspecified chronic kidney disease: Principal | ICD-10-CM | POA: Diagnosis present

## 2020-10-16 DIAGNOSIS — R569 Unspecified convulsions: Secondary | ICD-10-CM | POA: Diagnosis not present

## 2020-10-16 DIAGNOSIS — Z4800 Encounter for change or removal of nonsurgical wound dressing: Secondary | ICD-10-CM | POA: Diagnosis not present

## 2020-10-16 DIAGNOSIS — R000 Tachycardia, unspecified: Secondary | ICD-10-CM | POA: Diagnosis not present

## 2020-10-16 DIAGNOSIS — Z833 Family history of diabetes mellitus: Secondary | ICD-10-CM | POA: Diagnosis not present

## 2020-10-16 DIAGNOSIS — I5032 Chronic diastolic (congestive) heart failure: Secondary | ICD-10-CM | POA: Diagnosis not present

## 2020-10-16 DIAGNOSIS — I499 Cardiac arrhythmia, unspecified: Secondary | ICD-10-CM | POA: Diagnosis not present

## 2020-10-16 NOTE — ED Triage Notes (Signed)
Pt arrived via [facility] for cc of SOB from home. Pt reported periods of SOB x2 days that resolved, SOB increased tonight and called EMS when symptoms did not resolve. PMH of COPD, CHF. EMS reports O2 sats in 70s did not improve on NR, pt placed on CPAP, given 125 of Solu-Med and 2g of Mag and sats maintained in low 90s enroute.

## 2020-10-16 NOTE — ED Provider Notes (Signed)
[facility] EMERGENCY DEPARTMENT Provider Note   CSN: [id number] Arrival date & time: 10/16/20  2328   History Chief Complaint  Patient presents with  . Shortness of Breath    Lawrence [name] is a 65 y.o. male.  The history is provided by the patient and the EMS personnel.  Shortness of Breath He has history of hypertension, diabetes, hyperlipidemia, COPD, diastolic heart failure, venous stasis and is brought in by ambulance because of shortness of breath.  He has been having episodic shortness of breath for the last 2 days, but tonight he had an episode that did not resolve.  He denies cough and denies fever or chills.  He denies chest pain, heaviness, tightness, pressure.  Unfortunately, he is a very poor historian.  He does state that he weighs himself daily and has been gaining weight but cannot tell me how much weight he has gained.  EMS noted hypoxia and oxygen saturation in the 70s.  He was placed on a nonrebreather mask with 100% oxygen with no improvement in oxygen saturation.  He was placed on CPAP and oxygen saturation improved to the 90s.  He was given methylprednisolone and magnesium and transported here.  Past Medical History:  Diagnosis Date  . Acute on chronic diastolic CHF (congestive heart failure) ([place]) 01/13/2018  . Acute respiratory failure with hypoxia ([place]) 01/13/2018  . Adenomatous colon polyp 2007  . Anasarca 01/31/2017  . Anemia   . Anxiety   . Arthritis   . Asthma   . Bilateral lower extremity edema 01/31/2017  . Bilateral lower leg cellulitis   . BPH without obstruction/lower urinary tract symptoms 02/22/2017  . Cellulitis 11/07/2017  . Cellulitis of lower extremity   . Chest pain   . Chronic cough   . Chronic diastolic CHF (congestive heart failure) ([place])   . Chronic venous stasis 03/07/2019  . COPD (chronic obstructive pulmonary disease) ([place])   . Coronary artery calcification seen on CAT scan   . Depression   . Diabetic neuropathy ([place])  09/11/2019  . Hematuria 02/22/2017  . History of anemia 12/26/2018  . History of cardiac cath    a.  01/2018 with angiographically minimal coronary disease with  up to 40% prox LAD and 20% mid LCx and OM2 with normal LVF and normal LVEDP..  . History of colon polyps 08/24/2018  . Hypertension   . Leukocytosis 08/18/2018  . Morbid obesity ([place])   . New onset type 2 diabetes mellitus ([place]) 11/08/2017  . OSA (obstructive sleep apnea)   . Pain due to onychomycosis of toenails of both feet 09/11/2019  . Peripheral neuropathy 02/22/2017  . Physical deconditioning 02/22/2017  . Primary osteoarthritis, left shoulder 03/05/2017  . PTSD (post-traumatic stress disorder)   . Pure hypercholesterolemia   . QT prolongation 03/07/2019  . Right foot pain 01/13/2018  . Shortness of breath   . Sinus tachycardia 03/07/2019  . Sleep apnea    CPAP  . Type 2 diabetes mellitus with vascular disease ([place]) 09/11/2019    Patient Active Problem List   Diagnosis Date Noted  . Cellulitis of right leg 12/04/2019  . Pain due to onychomycosis of toenails of both feet 09/11/2019  . Diabetic neuropathy ([place]) 09/11/2019  . Type 2 diabetes mellitus with vascular disease ([place]) 09/11/2019  . Sinus tachycardia 03/07/2019  . Chronic venous stasis 03/07/2019  . QT prolongation 03/07/2019  . History of anemia 12/26/2018  . Anemia 08/24/2018  . History of colon polyps 08/24/2018  . Do  not resuscitate status 08/24/2018  . Chronic diastolic CHF (congestive heart failure) ([place]) 08/18/2018  . Leukocytosis 08/18/2018  . Bilateral lower leg cellulitis   . Coronary artery calcification seen on CAT scan   . Pure hypercholesterolemia   . Chest pain   . Shortness of breath   . Acute respiratory failure with hypoxia ([place]) 01/13/2018  . Right foot pain 01/13/2018  . Acute on chronic diastolic CHF (congestive heart failure) ([place]) 01/13/2018  . Diabetes mellitus type 2 in obese ([place]) 01/13/2018  . New onset type 2 diabetes mellitus ([place])  11/08/2017  . Cellulitis 11/07/2017  . Primary osteoarthritis, left shoulder 03/05/2017  . Essential hypertension, benign 02/22/2017  . BPH without obstruction/lower urinary tract symptoms 02/22/2017  . Hematuria 02/22/2017  . Peripheral neuropathy 02/22/2017  . Physical deconditioning 02/22/2017  . Morbid obesity ([place])   . PTSD (post-traumatic stress disorder)   . Anasarca 01/31/2017  . Bilateral lower extremity edema 01/31/2017  . Cellulitis of lower extremity   . OSA (obstructive sleep apnea)     Past Surgical History:  Procedure Laterality Date  . ENDOVENOUS ABLATION SAPHENOUS VEIN W/ LASER Right 08/20/2020   endovenous laser ablation right greater saphenous vein by Gae Gallop MD   . [place]     left knee replacement x 2  . KNEE ARTHROSCOPY Bilateral   . LEFT HEART CATH AND CORONARY ANGIOGRAPHY N/A 01/17/2018   Procedure: LEFT HEART CATH AND CORONARY ANGIOGRAPHY;  Surgeon: Leonie Man, MD;  Location: [facility];  Service: Cardiovascular;  Laterality: N/A;  . UMBILICAL HERNIA REPAIR         Family History  Problem Relation Age of Onset  . CAD Maternal Grandfather   . Diabetes Other   . Diabetes Mellitus II Neg Hx   . Colon cancer Neg Hx   . Esophageal cancer Neg Hx   . Inflammatory bowel disease Neg Hx   . Liver disease Neg Hx   . Pancreatic cancer Neg Hx   . Rectal cancer Neg Hx   . Stomach cancer Neg Hx     Social History   Tobacco Use  . Smoking status: Former Smoker    Quit date: 03/07/2016    Years since quitting: 4.6  . Smokeless tobacco: Never Used  Substance Use Topics  . Alcohol use: Not Currently    Comment: rare  . Drug use: No    Home Medications Prior to Admission medications   Medication Sig Start Date End Date Taking? Authorizing Provider  acetaminophen (TYLENOL) 650 MG CR tablet Take 1,300 mg by mouth every 8 (eight) hours as needed for pain.    [provider]  albuterol (VENTOLIN HFA) 108 (90 Base)  MCG/ACT inhaler Inhale into the lungs. 03/10/20   [provider]  allopurinol (ZYLOPRIM) 100 MG tablet Take 200 mg by mouth daily.    [provider]  ARIPiprazole (ABILIFY) 5 MG tablet Take 5 mg by mouth at bedtime.     [provider]  atorvastatin (LIPITOR) 40 MG tablet TAKE ONE TABLET BY MOUTH DAILY AT 6PM 05/29/20   Sueanne Margarita, MD  blood glucose meter kit and supplies Dispense based on patient and insurance preference. Use up to four times daily as directed. (FOR ICD-10 E10.9, E11.9). 11/09/17   Eugenie Filler, MD  cetirizine (ZYRTEC) 10 MG tablet Take 10 mg by mouth at bedtime.     [provider]  colchicine 0.6 MG tablet Take 0.6 mg by mouth as  needed (gout).  Patient not taking: No sig reported 08/27/19   [provider]  Dulaglutide (TRULICITY) 3 HO/1.2YQ SOPN Inject 3 mg into the skin once a week.    [provider]  ferrous sulfate 324 (65 Fe) MG TBEC Take 324 mg by mouth daily with breakfast.     [provider]  fluticasone (FLONASE) 50 MCG/ACT nasal spray Place 2 sprays into both nostrils daily.     [provider]  furosemide (LASIX) 80 MG tablet Take 2 tablets (160 mg total) every morning and 1 tablet (80 mg total) every evening. 03/20/20   Sueanne Margarita, MD  lisinopril (ZESTRIL) 20 MG tablet Take 1 tablet (20 mg total) by mouth daily. 03/20/20   Sueanne Margarita, MD  loperamide (IMODIUM) 2 MG capsule Take 2 mg by mouth 2 (two) times a day.    [provider]  LORazepam (ATIVAN) 1 MG tablet Take 2 tablets 30 minutes prior to leaving house on day of procedure and bring third tablet with you to office. 08/18/20   Angelia Mould, MD  metFORMIN (GLUCOPHAGE) 1000 MG tablet Take 1,000 mg by mouth 2 (two) times daily with a meal.    [provider]  metoprolol succinate (TOPROL-XL) 100 MG 24 hr tablet Take 1 tablet (100 mg total) by mouth in the morning and at bedtime. Take with or  immediately following a meal. 05/20/20   Tillery, Satira Mccallum, PA-C  omeprazole (PRILOSEC) 20 MG capsule Take 20 mg by mouth every morning. 05/29/20   [provider]  prazosin (MINIPRESS) 2 MG capsule Take 4 mg by mouth at bedtime.    [provider]  pregabalin (LYRICA) 100 MG capsule Take 100 mg by mouth in the morning, at noon, and at bedtime.  07/14/19   [provider]  propranolol (INDERAL) 10 MG tablet TAKE ONE TABLET BY MOUTH AS NEEDED 10/15/20   Sueanne Margarita, MD  silver sulfADIAZINE (SILVADENE) 1 % cream Apply 1 application topically [name] admin instructions. Apply as directed once a day as directed to bilateral legs    [provider]  spironolactone (ALDACTONE) 25 MG tablet Take 1 tablet (25 mg total) by mouth daily. 05/20/20   Shirley Friar, PA-C  temazepam (RESTORIL) 15 MG capsule Take 15 mg by mouth at bedtime.     [provider]  traZODone (DESYREL) 100 MG tablet Take 200 mg by mouth at bedtime.    [provider]  Trolamine Salicylate (ASPERCREME EX) Apply 1 application topically 2 (two) times daily as needed (pain and inflammation).    [provider]  venlafaxine XR (EFFEXOR-XR) 150 MG 24 hr capsule Take 150 mg by mouth daily with breakfast.    [provider]  Vitamin D, Ergocalciferol, (DRISDOL) 1.25 MG (50000 UNIT) CAPS capsule Take 50,000 Units by mouth every 7 (seven) days.    [provider]    Allergies    Vancomycin, Tubersol [tuberculin], and Doxycycline  Review of Systems   Review of Systems  Respiratory: Positive for shortness of breath.   All other systems reviewed and are negative.   Physical Exam Updated Vital Signs BP 104/81   Pulse (!) 118   Temp 99.2 F (37.3 C) (Axillary)   Resp 13   Ht _0  (1.88 m)   Wt (!) 190.5 kg   SpO2 98%   BMI 53.92 kg/m   Physical Exam Vitals and nursing note reviewed.   Morbidly obese 65 year old male  on BiPAP. Vital  signs are significant for rapid heart rate. Oxygen saturation is 98%, which is normal. Head is normocephalic and atraumatic. PERRLA, EOMI. Oropharynx is clear. Neck is nontender and supple without adenopathy or JVD. Back is nontender and there is no CVA tenderness. Lungs are clear without rales, wheezes, or rhonchi. Chest is nontender. Heart has regular rate and rhythm without murmur. Abdomen is soft, flat, nontender without masses or hepatosplenomegaly and peristalsis is normoactive. Extremities: 2-3+ presacral and pedal edema.  With moderate to severe venous stasis changes bilaterally and small venous stasis ulcer over the right lower leg anteriorly.  There is also 1+ edema of the arms. Skin is warm and dry without rash. Neurologic: Mental status is normal, cranial nerves are intact, there are no motor or sensory deficits.  ED Results / Procedures / Treatments   Labs (all labs ordered are listed, but only abnormal results are displayed) Labs Reviewed  COMPREHENSIVE METABOLIC PANEL - Abnormal; Notable for the following components:      Result Value   Sodium 132 (*)    Potassium 3.3 (*)    Chloride 91 (*)    Glucose, Bld 253 (*)    All other components within normal limits  CBC WITH DIFFERENTIAL/PLATELET - Abnormal; Notable for the following components:   WBC 19.3 (*)    Neutro Abs 17.5 (*)    Abs Immature Granulocytes 0.16 (*)    All other components within normal limits  I-STAT ARTERIAL BLOOD GAS, ED - Abnormal; Notable for the following components:   pH, Arterial 7.472 (*)    Bicarbonate 31.3 (*)    TCO2 33 (*)    Acid-Base Excess 7.0 (*)    Sodium 132 (*)    Potassium 3.2 (*)    All other components within normal limits  SARS CORONAVIRUS 2 (TAT 6-24 HRS)  BRAIN NATRIURETIC PEPTIDE  TROPONIN I (HIGH SENSITIVITY)  TROPONIN I (HIGH SENSITIVITY)    EKG EKG Interpretation  Date/Time:  Friday October 16 2020 23:36:31 EST Ventricular Rate:  111 PR Interval:    QRS  Duration: 120 QT Interval:  355 QTC Calculation: 483 R Axis:   59 Text Interpretation: Sinus tachycardia Ventricular premature complex Nonspecific intraventricular conduction delay Borderline T abnormalities, anterior leads Low voltage QRS When compared with ECG of 02/04/2020, No significant change was found Confirmed by Delora Fuel ([id number]) on 10/16/2020 11:40:00 PM   Radiology DG Chest Port 1 View  Result Date: 10/17/2020 CLINICAL DATA:  Shortness of breath EXAM: PORTABLE CHEST 1 VIEW COMPARISON:  Chest x-ray 02/04/2020, CT angio chest 09/09/2019 FINDINGS: Similar appearing cardiomegaly. The heart size and mediastinal contours are unchanged. Streaky airspace opacity of the lung bases. Increased interstitial markings. No pleural effusion. No pneumothorax. No acute osseous abnormality. IMPRESSION: Pulmonary edema versus bronchitis. Electronically Signed   By: Iven Finn M.D.   On: 10/17/2020 00:00    Procedures Procedures  CRITICAL CARE Performed by: Delora Fuel Total critical care time: 45 minutes Critical care time was exclusive of separately billable procedures and treating other patients. Critical care was necessary to treat or prevent imminent or life-threatening deterioration. Critical care was time spent personally by me on the following activities: development of treatment plan with patient and/or surrogate as well as nursing, discussions with consultants, evaluation of patient's response to treatment, examination of patient, obtaining history from patient or surrogate, ordering and performing treatments and interventions, ordering and review of laboratory studies, ordering and review of radiographic studies, pulse oximetry and re-evaluation of patient's  condition.  Medications Ordered in ED Medications  furosemide (LASIX) 100 mg in dextrose 5 % 50 mL IVPB (has no administration in time range)  potassium chloride SA (KLOR-CON) CR tablet 40 mEq (has no administration in time range)     ED Course  I have reviewed the triage vital signs and the nursing notes.  Pertinent labs & imaging results that were available during my care of the patient were reviewed by me and considered in my medical decision making ([name] chart for details).  MDM Rules/Calculators/A&P Dyspnea and patient with history of both COPD and heart failure.  No wheezes or other evidence of bronchospasm on exam, so I feel this is most likely heart failure exacerbation.  ECG is unchanged.  Will check chest x-ray and screening labs and maintain on BiPAP.  Old records are reviewed, and prior blood gas showed mild CO2 retention.  He has several prior admissions related to heart failure.  Will check ABG today to make sure he is not developing a respiratory acidosis.  ABG showed normal PCO2 and pH.  Chest x-ray is consistent with heart failure with mild pulmonary edema.  He is given a dose of furosemide.  ECG is unchanged from prior.  Labs show mild hyponatremia and mild hypokalemia.  He is given a dose of oral potassium.  Troponin is normal.  Patient took off his BiPAP mask stating that he preferred not to have it.  He was maintaining adequate oxygen saturation with just supplemental oxygen at this point.  BNP is still pending.  Case discussed with Dr. Tonie Griffith of [facility], who agrees to admit the patient.  Final Clinical Impression(s) / ED Diagnoses Final diagnoses:  Acute respiratory failure with hypoxia (HCC)  Acute on chronic diastolic heart failure (HCC)  Diuretic-induced hypokalemia  Hyponatremia    Rx / DC Orders ED Discharge Orders    None       Delora Fuel, MD 13/64/38 0147

## 2020-10-17 ENCOUNTER — Inpatient Hospital Stay (HOSPITAL_COMMUNITY): Payer: Medicare Other

## 2020-10-17 ENCOUNTER — Encounter (HOSPITAL_COMMUNITY): Payer: Self-pay | Admitting: Family Medicine

## 2020-10-17 ENCOUNTER — Other Ambulatory Visit: Payer: Self-pay

## 2020-10-17 DIAGNOSIS — Z66 Do not resuscitate: Secondary | ICD-10-CM | POA: Diagnosis not present

## 2020-10-17 DIAGNOSIS — Z8249 Family history of ischemic heart disease and other diseases of the circulatory system: Secondary | ICD-10-CM | POA: Diagnosis not present

## 2020-10-17 DIAGNOSIS — Z833 Family history of diabetes mellitus: Secondary | ICD-10-CM | POA: Diagnosis not present

## 2020-10-17 DIAGNOSIS — X58XXXA Exposure to other specified factors, initial encounter: Secondary | ICD-10-CM | POA: Diagnosis present

## 2020-10-17 DIAGNOSIS — Z743 Need for continuous supervision: Secondary | ICD-10-CM | POA: Diagnosis not present

## 2020-10-17 DIAGNOSIS — I10 Essential (primary) hypertension: Secondary | ICD-10-CM | POA: Diagnosis not present

## 2020-10-17 DIAGNOSIS — M7989 Other specified soft tissue disorders: Secondary | ICD-10-CM | POA: Diagnosis not present

## 2020-10-17 DIAGNOSIS — J449 Chronic obstructive pulmonary disease, unspecified: Secondary | ICD-10-CM | POA: Diagnosis present

## 2020-10-17 DIAGNOSIS — E871 Hypo-osmolality and hyponatremia: Secondary | ICD-10-CM | POA: Diagnosis not present

## 2020-10-17 DIAGNOSIS — G4733 Obstructive sleep apnea (adult) (pediatric): Secondary | ICD-10-CM | POA: Diagnosis present

## 2020-10-17 DIAGNOSIS — L03115 Cellulitis of right lower limb: Secondary | ICD-10-CM | POA: Diagnosis not present

## 2020-10-17 DIAGNOSIS — R569 Unspecified convulsions: Secondary | ICD-10-CM | POA: Diagnosis not present

## 2020-10-17 DIAGNOSIS — I5033 Acute on chronic diastolic (congestive) heart failure: Secondary | ICD-10-CM

## 2020-10-17 DIAGNOSIS — I130 Hypertensive heart and chronic kidney disease with heart failure and stage 1 through stage 4 chronic kidney disease, or unspecified chronic kidney disease: Secondary | ICD-10-CM | POA: Diagnosis not present

## 2020-10-17 DIAGNOSIS — I471 Supraventricular tachycardia: Secondary | ICD-10-CM | POA: Diagnosis not present

## 2020-10-17 DIAGNOSIS — Z79899 Other long term (current) drug therapy: Secondary | ICD-10-CM | POA: Diagnosis not present

## 2020-10-17 DIAGNOSIS — N179 Acute kidney failure, unspecified: Secondary | ICD-10-CM | POA: Diagnosis not present

## 2020-10-17 DIAGNOSIS — E1142 Type 2 diabetes mellitus with diabetic polyneuropathy: Secondary | ICD-10-CM | POA: Diagnosis present

## 2020-10-17 DIAGNOSIS — Z20822 Contact with and (suspected) exposure to covid-19: Secondary | ICD-10-CM | POA: Diagnosis not present

## 2020-10-17 DIAGNOSIS — E876 Hypokalemia: Secondary | ICD-10-CM

## 2020-10-17 DIAGNOSIS — Z86010 Personal history of colonic polyps: Secondary | ICD-10-CM | POA: Diagnosis not present

## 2020-10-17 DIAGNOSIS — I2583 Coronary atherosclerosis due to lipid rich plaque: Secondary | ICD-10-CM | POA: Diagnosis not present

## 2020-10-17 DIAGNOSIS — Z87891 Personal history of nicotine dependence: Secondary | ICD-10-CM | POA: Diagnosis not present

## 2020-10-17 DIAGNOSIS — I2510 Atherosclerotic heart disease of native coronary artery without angina pectoris: Secondary | ICD-10-CM | POA: Diagnosis not present

## 2020-10-17 DIAGNOSIS — Z6841 Body Mass Index (BMI) 40.0 and over, adult: Secondary | ICD-10-CM | POA: Diagnosis not present

## 2020-10-17 DIAGNOSIS — R279 Unspecified lack of coordination: Secondary | ICD-10-CM | POA: Diagnosis not present

## 2020-10-17 DIAGNOSIS — L03116 Cellulitis of left lower limb: Secondary | ICD-10-CM | POA: Diagnosis not present

## 2020-10-17 DIAGNOSIS — Y92239 Unspecified place in hospital as the place of occurrence of the external cause: Secondary | ICD-10-CM | POA: Diagnosis not present

## 2020-10-17 DIAGNOSIS — E1122 Type 2 diabetes mellitus with diabetic chronic kidney disease: Secondary | ICD-10-CM | POA: Diagnosis present

## 2020-10-17 DIAGNOSIS — Z7984 Long term (current) use of oral hypoglycemic drugs: Secondary | ICD-10-CM | POA: Diagnosis not present

## 2020-10-17 DIAGNOSIS — N400 Enlarged prostate without lower urinary tract symptoms: Secondary | ICD-10-CM | POA: Diagnosis present

## 2020-10-17 DIAGNOSIS — J9601 Acute respiratory failure with hypoxia: Secondary | ICD-10-CM | POA: Diagnosis not present

## 2020-10-17 DIAGNOSIS — T502X5A Adverse effect of carbonic-anhydrase inhibitors, benzothiadiazides and other diuretics, initial encounter: Secondary | ICD-10-CM | POA: Diagnosis not present

## 2020-10-17 DIAGNOSIS — Z23 Encounter for immunization: Secondary | ICD-10-CM | POA: Diagnosis not present

## 2020-10-17 DIAGNOSIS — J9690 Respiratory failure, unspecified, unspecified whether with hypoxia or hypercapnia: Secondary | ICD-10-CM | POA: Diagnosis not present

## 2020-10-17 DIAGNOSIS — R000 Tachycardia, unspecified: Secondary | ICD-10-CM | POA: Diagnosis not present

## 2020-10-17 DIAGNOSIS — I509 Heart failure, unspecified: Secondary | ICD-10-CM | POA: Insufficient documentation

## 2020-10-17 LAB — CREATININE, SERUM
Creatinine, Ser: 1.01 mg/dL (ref 0.61–1.24)
GFR, Estimated: >60 mL/min (ref >60)

## 2020-10-17 LAB — CBC WITH DIFFERENTIAL/PLATELET
Abs Immature Granulocytes: 0.16 10*3/uL — ABNORMAL HIGH (ref 0.00–0.07)
Basophils Absolute: 0.1 10*3/uL (ref 0.0–0.1)
Basophils Relative: 0 %
Eosinophils Absolute: 0 10*3/uL (ref 0.0–0.5)
Eosinophils Relative: 0 %
HCT: 43.3 % (ref 39.0–52.0)
Hemoglobin: 13.9 g/dL (ref 13.0–17.0)
Immature Granulocytes: 1 %
Lymphocytes Relative: 4 %
Lymphs Abs: 0.8 10*3/uL (ref 0.7–4.0)
MCH: 28.6 pg (ref 26.0–34.0)
MCHC: 32.1 g/dL (ref 30.0–36.0)
MCV: 89.1 fL (ref 80.0–100.0)
Monocytes Absolute: 0.8 10*3/uL (ref 0.1–1.0)
Monocytes Relative: 4 %
Neutro Abs: 17.5 10*3/uL — ABNORMAL HIGH (ref 1.7–7.7)
Neutrophils Relative %: 91 %
Platelets: 288 10*3/uL (ref 150–400)
RBC: 4.86 MIL/uL (ref 4.22–5.81)
RDW: 14.6 % (ref 11.5–15.5)
WBC: 19.3 10*3/uL — ABNORMAL HIGH (ref 4.0–10.5)
nRBC: 0 % (ref 0.0–0.2)

## 2020-10-17 LAB — BASIC METABOLIC PANEL
Anion gap: 12 (ref 5–15)
BUN: 18 mg/dL (ref 8–23)
CO2: 30 mmol/L (ref 22–32)
Calcium: 9 mg/dL (ref 8.9–10.3)
Chloride: 88 mmol/L — ABNORMAL LOW (ref 98–111)
Creatinine, Ser: 1.01 mg/dL (ref 0.61–1.24)
GFR, Estimated: >60 mL/min (ref >60)
Glucose, Bld: 341 mg/dL — ABNORMAL HIGH (ref 70–99)
Potassium: 3.3 mmol/L — ABNORMAL LOW (ref 3.5–5.1)
Sodium: 130 mmol/L — ABNORMAL LOW (ref 135–145)

## 2020-10-17 LAB — COMPREHENSIVE METABOLIC PANEL
ALT: 26 U/L (ref 0–44)
AST: 25 U/L (ref 15–41)
Albumin: 3.7 g/dL (ref 3.5–5.0)
Alkaline Phosphatase: 77 U/L (ref 38–126)
Anion gap: 11 (ref 5–15)
BUN: 17 mg/dL (ref 8–23)
CO2: 30 mmol/L (ref 22–32)
Calcium: 9.3 mg/dL (ref 8.9–10.3)
Chloride: 91 mmol/L — ABNORMAL LOW (ref 98–111)
Creatinine, Ser: 1.08 mg/dL (ref 0.61–1.24)
GFR, Estimated: >60 mL/min (ref >60)
Glucose, Bld: 253 mg/dL — ABNORMAL HIGH (ref 70–99)
Potassium: 3.3 mmol/L — ABNORMAL LOW (ref 3.5–5.1)
Sodium: 132 mmol/L — ABNORMAL LOW (ref 135–145)
Total Bilirubin: 1 mg/dL (ref 0.3–1.2)
Total Protein: 7.4 g/dL (ref 6.5–8.1)

## 2020-10-17 LAB — TROPONIN I (HIGH SENSITIVITY)
Troponin I (High Sensitivity): 12 ng/L (ref <18)
Troponin I (High Sensitivity): 14 ng/L (ref <18)
Troponin I (High Sensitivity): 14 ng/L (ref <18)
Troponin I (High Sensitivity): 18 ng/L — ABNORMAL HIGH (ref <18)

## 2020-10-17 LAB — CBC
HCT: 43.2 % (ref 39.0–52.0)
Hemoglobin: 14.1 g/dL (ref 13.0–17.0)
MCH: 28.8 pg (ref 26.0–34.0)
MCHC: 32.6 g/dL (ref 30.0–36.0)
MCV: 88.2 fL (ref 80.0–100.0)
Platelets: 281 10*3/uL (ref 150–400)
RBC: 4.9 MIL/uL (ref 4.22–5.81)
RDW: 14.5 % (ref 11.5–15.5)
WBC: 25.7 10*3/uL — ABNORMAL HIGH (ref 4.0–10.5)
nRBC: 0 % (ref 0.0–0.2)

## 2020-10-17 LAB — ECHOCARDIOGRAM COMPLETE
AR max vel: 1.41 cm2
AV Area VTI: 1.38 cm2
AV Area mean vel: 1.5 cm2
AV Mean grad: 19.2 mmHg
AV Peak grad: 37.1 mmHg
Ao pk vel: 3.05 m/s
Area-P 1/2: 4.68 cm2
Calc EF: 61 %
Height: 74 in
S' Lateral: 2.8 cm
Single Plane A2C EF: 61.9 %
Single Plane A4C EF: 67.3 %
Weight: 6720 oz

## 2020-10-17 LAB — GLUCOSE, CAPILLARY
Glucose-Capillary: 455 mg/dL — ABNORMAL HIGH (ref 70–99)
Glucose-Capillary: 359 mg/dL — ABNORMAL HIGH (ref 70–99)

## 2020-10-17 LAB — HEMOGLOBIN A1C
Hgb A1c MFr Bld: 9.2 % — ABNORMAL HIGH (ref 4.8–5.6)
Mean Plasma Glucose: 217.34 mg/dL

## 2020-10-17 LAB — I-STAT ARTERIAL BLOOD GAS, ED
Acid-Base Excess: 7 mmol/L — ABNORMAL HIGH (ref 0.0–2.0)
Bicarbonate: 31.3 mmol/L — ABNORMAL HIGH (ref 20.0–28.0)
Calcium, Ion: 1.18 mmol/L (ref 1.15–1.40)
HCT: 43 % (ref 39.0–52.0)
Hemoglobin: 14.6 g/dL (ref 13.0–17.0)
O2 Saturation: 97 %
Patient temperature: 99.2
Potassium: 3.2 mmol/L — ABNORMAL LOW (ref 3.5–5.1)
Sodium: 132 mmol/L — ABNORMAL LOW (ref 135–145)
TCO2: 33 mmol/L — ABNORMAL HIGH (ref 22–32)
pCO2 arterial: 43 mmHg (ref 32.0–48.0)
pH, Arterial: 7.472 — ABNORMAL HIGH (ref 7.350–7.450)
pO2, Arterial: 87 mmHg (ref 83.0–108.0)

## 2020-10-17 LAB — LIPID PANEL
Cholesterol: 116 mg/dL (ref 0–200)
HDL: 33 mg/dL — ABNORMAL LOW (ref >40)
LDL Cholesterol: 58 mg/dL (ref 0–99)
Total CHOL/HDL Ratio: 3.5 RATIO
Triglycerides: 127 mg/dL (ref <150)
VLDL: 25 mg/dL (ref 0–40)

## 2020-10-17 LAB — URIC ACID: Uric Acid, Serum: 6.9 mg/dL (ref 3.7–8.6)

## 2020-10-17 LAB — CBG MONITORING, ED
Glucose-Capillary: 346 mg/dL — ABNORMAL HIGH (ref 70–99)
Glucose-Capillary: 388 mg/dL — ABNORMAL HIGH (ref 70–99)

## 2020-10-17 LAB — GLUCOSE, RANDOM: Glucose, Bld: 447 mg/dL — ABNORMAL HIGH (ref 70–99)

## 2020-10-17 LAB — MAGNESIUM
Magnesium: 1.8 mg/dL (ref 1.7–2.4)
Magnesium: 1.8 mg/dL (ref 1.7–2.4)

## 2020-10-17 LAB — BRAIN NATRIURETIC PEPTIDE: B Natriuretic Peptide: 61.4 pg/mL (ref 0.0–100.0)

## 2020-10-17 LAB — SARS CORONAVIRUS 2 (TAT 6-24 HRS): SARS Coronavirus 2: NEGATIVE

## 2020-10-17 MED ORDER — FUROSEMIDE 10 MG/ML IJ SOLN
100.0000 mg | Freq: Two times a day (BID) | INTRAVENOUS | Status: DC
  Administered 2020-10-17 – 2020-10-18 (×2): 100 mg via INTRAVENOUS
  Filled 2020-10-17 (×4): qty 10

## 2020-10-17 MED ORDER — SODIUM CHLORIDE 0.9% FLUSH: 3.0000 mL | INTRAVENOUS | Status: DC | PRN

## 2020-10-17 MED ORDER — ASPIRIN EC 81 MG PO TBEC
81.0000 mg | DELAYED_RELEASE_TABLET | Freq: Every day | ORAL | Status: DC
  Administered 2020-10-17 – 2020-10-26 (×10): 81 mg via ORAL
  Filled 2020-10-17 (×10): qty 1

## 2020-10-17 MED ORDER — POTASSIUM CHLORIDE CRYS ER 20 MEQ PO TBCR
40.0000 meq | EXTENDED_RELEASE_TABLET | Freq: Once | ORAL | Status: AC
  Administered 2020-10-17: 40 meq via ORAL
  Filled 2020-10-17: qty 2

## 2020-10-17 MED ORDER — SODIUM CHLORIDE 0.9 % IV SOLN
250.0000 mL | INTRAVENOUS | Status: DC | PRN
  Administered 2020-10-18: 250 mL via INTRAVENOUS

## 2020-10-17 MED ORDER — PNEUMOCOCCAL VAC POLYVALENT 25 MCG/0.5ML IJ INJ
0.5000 mL | INJECTION | INTRAMUSCULAR | Status: AC
  Administered 2020-10-18: 0.5 mL via INTRAMUSCULAR
  Filled 2020-10-17: qty 0.5

## 2020-10-17 MED ORDER — FUROSEMIDE 10 MG/ML IJ SOLN
100.0000 mg | Freq: Once | INTRAVENOUS | Status: AC
  Administered 2020-10-17: 100 mg via INTRAVENOUS
  Filled 2020-10-17: qty 10

## 2020-10-17 MED ORDER — METOPROLOL SUCCINATE ER 100 MG PO TB24
100.0000 mg | ORAL_TABLET | Freq: Two times a day (BID) | ORAL | Status: DC
  Administered 2020-10-17 (×2): 100 mg via ORAL
  Filled 2020-10-17 (×2): qty 1

## 2020-10-17 MED ORDER — DULAGLUTIDE 3 MG/0.5ML ~~LOC~~ SOAJ: 3.0000 mg | SUBCUTANEOUS | Status: DC

## 2020-10-17 MED ORDER — METFORMIN HCL ER 500 MG PO TB24
1000.0000 mg | ORAL_TABLET | Freq: Two times a day (BID) | ORAL | Status: DC
  Filled 2020-10-17: qty 2

## 2020-10-17 MED ORDER — TRAZODONE HCL 100 MG PO TABS
200.0000 mg | ORAL_TABLET | Freq: Every day | ORAL | Status: DC
  Administered 2020-10-17 – 2020-10-25 (×8): 200 mg via ORAL
  Filled 2020-10-17 (×9): qty 2

## 2020-10-17 MED ORDER — POTASSIUM CHLORIDE CRYS ER 20 MEQ PO TBCR
40.0000 meq | EXTENDED_RELEASE_TABLET | Freq: Every day | ORAL | Status: DC
  Administered 2020-10-18: 40 meq via ORAL
  Filled 2020-10-17 (×2): qty 2

## 2020-10-17 MED ORDER — INSULIN GLARGINE 100 UNIT/ML ~~LOC~~ SOLN
15.0000 [IU] | Freq: Every day | SUBCUTANEOUS | Status: DC
  Administered 2020-10-17 – 2020-10-18 (×2): 15 [IU] via SUBCUTANEOUS
  Filled 2020-10-17 (×2): qty 0.15

## 2020-10-17 MED ORDER — ACETAMINOPHEN 325 MG PO TABS
650.0000 mg | ORAL_TABLET | ORAL | Status: DC | PRN
  Administered 2020-10-17 – 2020-10-26 (×10): 650 mg via ORAL
  Filled 2020-10-17 (×9): qty 2

## 2020-10-17 MED ORDER — ATORVASTATIN CALCIUM 40 MG PO TABS
40.0000 mg | ORAL_TABLET | Freq: Every day | ORAL | Status: DC
  Administered 2020-10-17 – 2020-10-26 (×10): 40 mg via ORAL
  Filled 2020-10-17 (×10): qty 1

## 2020-10-17 MED ORDER — METFORMIN HCL 500 MG PO TABS: 1000.0000 mg | ORAL_TABLET | Freq: Two times a day (BID) | ORAL | Status: DC

## 2020-10-17 MED ORDER — FUROSEMIDE 10 MG/ML IJ SOLN
100.0000 mg | Freq: Two times a day (BID) | INTRAVENOUS | Status: DC
  Administered 2020-10-17: 100 mg via INTRAVENOUS
  Filled 2020-10-17 (×3): qty 10

## 2020-10-17 MED ORDER — METOPROLOL SUCCINATE ER 100 MG PO TB24: 100.0000 mg | ORAL_TABLET | Freq: Every day | ORAL | Status: DC

## 2020-10-17 MED ORDER — LISINOPRIL 20 MG PO TABS
20.0000 mg | ORAL_TABLET | Freq: Every day | ORAL | Status: DC
  Administered 2020-10-17: 20 mg via ORAL
  Filled 2020-10-17: qty 1

## 2020-10-17 MED ORDER — INSULIN ASPART 100 UNIT/ML ~~LOC~~ SOLN
4.0000 [IU] | Freq: Three times a day (TID) | SUBCUTANEOUS | Status: DC
  Administered 2020-10-17 – 2020-10-21 (×13): 4 [IU] via SUBCUTANEOUS

## 2020-10-17 MED ORDER — OXYCODONE HCL 5 MG PO TABS
5.0000 mg | ORAL_TABLET | Freq: Four times a day (QID) | ORAL | Status: DC | PRN
  Administered 2020-10-17 – 2020-10-20 (×11): 10 mg via ORAL
  Administered 2020-10-21: 5 mg via ORAL
  Administered 2020-10-21 – 2020-10-23 (×4): 10 mg via ORAL
  Administered 2020-10-24 (×2): 5 mg via ORAL
  Administered 2020-10-25 – 2020-10-26 (×4): 10 mg via ORAL
  Filled 2020-10-17 (×6): qty 2
  Filled 2020-10-17: qty 1
  Filled 2020-10-17 (×4): qty 2
  Filled 2020-10-17: qty 1
  Filled 2020-10-17 (×2): qty 2
  Filled 2020-10-17: qty 1
  Filled 2020-10-17 (×9): qty 2

## 2020-10-17 MED ORDER — INSULIN ASPART 100 UNIT/ML ~~LOC~~ SOLN: 4.0000 [IU] | Freq: Three times a day (TID) | SUBCUTANEOUS | Status: DC

## 2020-10-17 MED ORDER — OXYCODONE HCL 5 MG PO TABS
5.0000 mg | ORAL_TABLET | Freq: Three times a day (TID) | ORAL | Status: DC | PRN
  Administered 2020-10-17: 5 mg via ORAL
  Filled 2020-10-17: qty 1

## 2020-10-17 MED ORDER — MAGNESIUM SULFATE 2 GM/50ML IV SOLN
2.0000 g | Freq: Once | INTRAVENOUS | Status: AC
  Administered 2020-10-17: 2 g via INTRAVENOUS
  Filled 2020-10-17: qty 50

## 2020-10-17 MED ORDER — ENOXAPARIN SODIUM 100 MG/ML ~~LOC~~ SOLN
90.0000 mg | SUBCUTANEOUS | Status: DC
  Administered 2020-10-17 – 2020-10-23 (×7): 90 mg via SUBCUTANEOUS
  Filled 2020-10-17: qty 0.9
  Filled 2020-10-17 (×6): qty 1

## 2020-10-17 MED ORDER — INSULIN ASPART 100 UNIT/ML ~~LOC~~ SOLN
0.0000 [IU] | Freq: Every day | SUBCUTANEOUS | Status: DC
  Administered 2020-10-17: 5 [IU] via SUBCUTANEOUS
  Administered 2020-10-18 – 2020-10-20 (×3): 3 [IU] via SUBCUTANEOUS
  Administered 2020-10-21: 4 [IU] via SUBCUTANEOUS
  Administered 2020-10-22: 2 [IU] via SUBCUTANEOUS

## 2020-10-17 MED ORDER — SODIUM CHLORIDE 0.9% FLUSH
3.0000 mL | Freq: Two times a day (BID) | INTRAVENOUS | Status: DC
  Administered 2020-10-17 – 2020-10-26 (×19): 3 mL via INTRAVENOUS

## 2020-10-17 MED ORDER — SODIUM CHLORIDE 0.9 % IV SOLN
1.0000 g | INTRAVENOUS | Status: DC
  Administered 2020-10-17 – 2020-10-23 (×7): 1 g via INTRAVENOUS
  Filled 2020-10-17 (×7): qty 10

## 2020-10-17 MED ORDER — SPIRONOLACTONE 25 MG PO TABS
25.0000 mg | ORAL_TABLET | Freq: Every day | ORAL | Status: DC
  Administered 2020-10-17 – 2020-10-26 (×10): 25 mg via ORAL
  Filled 2020-10-17 (×11): qty 1

## 2020-10-17 MED ORDER — POTASSIUM CHLORIDE CRYS ER 20 MEQ PO TBCR
40.0000 meq | EXTENDED_RELEASE_TABLET | Freq: Once | ORAL | Status: AC
  Administered 2020-10-17: 40 meq via ORAL

## 2020-10-17 MED ORDER — INSULIN ASPART 100 UNIT/ML ~~LOC~~ SOLN
0.0000 [IU] | Freq: Three times a day (TID) | SUBCUTANEOUS | Status: DC
  Administered 2020-10-17: 11 [IU] via SUBCUTANEOUS
  Administered 2020-10-17 (×2): 15 [IU] via SUBCUTANEOUS
  Administered 2020-10-18 – 2020-10-19 (×5): 8 [IU] via SUBCUTANEOUS
  Administered 2020-10-19: 11 [IU] via SUBCUTANEOUS
  Administered 2020-10-20: 8 [IU] via SUBCUTANEOUS
  Administered 2020-10-20: 3 [IU] via SUBCUTANEOUS
  Administered 2020-10-20: 8 [IU] via SUBCUTANEOUS
  Administered 2020-10-21: 4 [IU] via SUBCUTANEOUS
  Administered 2020-10-21: 8 [IU] via SUBCUTANEOUS
  Administered 2020-10-21: 15 [IU] via SUBCUTANEOUS
  Administered 2020-10-22: 11 [IU] via SUBCUTANEOUS
  Administered 2020-10-22: 3 [IU] via SUBCUTANEOUS
  Administered 2020-10-22: 8 [IU] via SUBCUTANEOUS
  Administered 2020-10-23: 5 [IU] via SUBCUTANEOUS
  Administered 2020-10-23: 3 [IU] via SUBCUTANEOUS
  Administered 2020-10-23: 5 [IU] via SUBCUTANEOUS
  Administered 2020-10-24 – 2020-10-25 (×5): 3 [IU] via SUBCUTANEOUS
  Administered 2020-10-26: 2 [IU] via SUBCUTANEOUS
  Administered 2020-10-26: 3 [IU] via SUBCUTANEOUS

## 2020-10-17 NOTE — Consult Note (Signed)
Cardiology Consultation:   Patient ID: Lawrence [name] MRN: [id number]; DOB: 1955/09/07  Admit date: 10/16/2020 Date of Consult: 10/17/2020  PCP:  Jenny Reichmann, PA-C (Inactive)   [place]  Cardiologist:  Fransico Him, MD  Advanced Practice Provider:  No care team member to display Electrophysiologist:  [name], MD    Patient Profile:   Lawrence [name] is a 65 y.o. male with a hx of nonobstructive CAD, HTN, HLD, diastolic HF, OSA on BiPAP, morbid obesity, depression and anxiety who is being seen today for the evaluation of  CHF at the request of Dr. Tyrell Antonio.  History of Present Illness:   Lawrence [name] is a 65 year old male with the history detailed who is followed by Dr. Radford Pax as an out-patient. Has history of SVT for which he has been followed by EP and was placed on metoprolol 156m BID. Also with history of nonobstructive CAD by cath in [address]and diastolic HF on lasix 1384YKdaily in the AM and 839min the PM.  During this admission, the patient was brought in by EMS for worsening SOB and weight gain found to be hypoxic in the 70s. He was placed on NRB-->BiPAP with improvement of SpO2 to the 90s. CXR with pulmonary edema. ECG with sinus tachycardia, but with possible Afib on the monitor (difficult to discern if Afib or sinus tach with PACs). Trop 11. BNP 61. Given lasix 1002mn the ER.  Currently, the patient states he feels much better since receiving the lasix. Breathing improved. Has been having worsening symptoms over the past couple of weeks. States that he bought a new monitor and it was telling him that he was in Afib for the past 2 weeks with HR 120s. He thinks this may be the cause of his worsening symptoms as he has been compliant with all medications and diet. No recent illnesses.   Past Medical History:  Diagnosis Date  . Acute on chronic diastolic CHF (congestive heart failure) ([facility]/03/2018  . Acute respiratory failure with  hypoxia ([facility]/03/2018  . Adenomatous colon polyp 2007  . Anasarca 01/31/2017  . Anemia   . Anxiety   . Arthritis   . Asthma   . Bilateral lower extremity edema 01/31/2017  . Bilateral lower leg cellulitis   . BPH without obstruction/lower urinary tract symptoms 02/22/2017  . Cellulitis 11/07/2017  . Cellulitis of lower extremity   . Chest pain   . Chronic cough   . Chronic diastolic CHF (congestive heart failure) ([facility] . Chronic venous stasis 03/07/2019  . COPD (chronic obstructive pulmonary disease) ([place] [place] . Coronary artery calcification seen on CAT scan   . Depression   . Diabetic neuropathy ([facility]/10/2019  . Hematuria 02/22/2017  . History of anemia 12/26/2018  . History of cardiac cath    a.  01/2018 with angiographically minimal coronary disease with  up to 40% prox LAD and 20% mid LCx and OM2 with normal LVF and normal LVEDP..  . History of colon polyps 08/24/2018  . Hypertension   . Leukocytosis 08/18/2018  . Morbid obesity ([facility] . New onset type 2 diabetes mellitus ([facility]/10/2017  . OSA (obstructive sleep apnea)   . Pain due to onychomycosis of toenails of both feet 09/11/2019  . Peripheral neuropathy 02/22/2017  . Physical deconditioning 02/22/2017  . Primary osteoarthritis, left shoulder 03/05/2017  . PTSD (post-traumatic stress disorder)   . Pure hypercholesterolemia   . QT prolongation 03/07/2019  .  Right foot pain 01/13/2018  . Shortness of breath   . Sinus tachycardia 03/07/2019  . Sleep apnea    CPAP  . Type 2 diabetes mellitus with vascular disease ([place]) 09/11/2019    Past Surgical History:  Procedure Laterality Date  . ENDOVENOUS ABLATION SAPHENOUS VEIN W/ LASER Right 08/20/2020   endovenous laser ablation right greater saphenous vein by Gae Gallop MD   . [place]     left knee replacement x 2  . KNEE ARTHROSCOPY Bilateral   . LEFT HEART CATH AND CORONARY ANGIOGRAPHY N/A 01/17/2018   Procedure: LEFT HEART CATH AND CORONARY ANGIOGRAPHY;  Surgeon:  Leonie Man, MD;  Location: [facility];  Service: Cardiovascular;  Laterality: N/A;  . UMBILICAL HERNIA REPAIR       Home Medications:  Prior to Admission medications   Medication Sig Start Date End Date Taking? Authorizing Provider  acetaminophen (TYLENOL) 650 MG CR tablet Take 1,300 mg by mouth every 8 (eight) hours as needed for pain.   Yes [provider]  albuterol (VENTOLIN HFA) 108 (90 Base) MCG/ACT inhaler Inhale into the lungs. 03/10/20  Yes [provider]  allopurinol (ZYLOPRIM) 100 MG tablet Take 100 mg by mouth 2 (two) times daily.   Yes [provider]  ARIPiprazole (ABILIFY) 5 MG tablet Take 5 mg by mouth at bedtime.    Yes [provider]  atorvastatin (LIPITOR) 40 MG tablet TAKE ONE TABLET BY MOUTH DAILY AT 6PM 05/29/20  Yes Turner, Eber Hong, MD  cetirizine (ZYRTEC) 10 MG tablet Take 10 mg by mouth at bedtime.    Yes [provider]  Dulaglutide (TRULICITY) 3 LY/6.5KP SOPN Inject 3 mg into the skin once a week.   Yes [provider]  ferrous sulfate 324 (65 Fe) MG TBEC Take 324 mg by mouth daily with breakfast.    Yes [provider]  fluticasone (FLONASE) 50 MCG/ACT nasal spray Place 2 sprays into both nostrils daily.    Yes [provider]  furosemide (LASIX) 80 MG tablet Take 2 tablets (160 mg total) every morning and 1 tablet (80 mg total) every evening. 03/20/20  Yes Turner, Eber Hong, MD  LANTUS SOLOSTAR 100 UNIT/ML Solostar Pen Inject 3 Units into the skin daily. 09/10/20  Yes [provider]  loperamide (IMODIUM) 2 MG capsule Take 2 mg by mouth 2 (two) times a day.   Yes [provider]  metFORMIN (GLUCOPHAGE) 1000 MG tablet Take 1,000 mg by mouth 2 (two) times daily with a meal.   Yes [provider]  metoprolol succinate (TOPROL-XL) 100 MG 24 hr tablet Take 1 tablet (100 mg total) by mouth in the morning and at bedtime. Take with or immediately following a meal.  05/20/20  Yes Tillery, Satira Mccallum, PA-C  omeprazole (PRILOSEC) 20 MG capsule Take 20 mg by mouth every morning. 05/29/20  Yes [provider]  oxyCODONE (OXY IR/ROXICODONE) 5 MG immediate release tablet Take 5 mg by mouth every 8 (eight) hours as needed. 09/17/20  Yes [provider]  prazosin (MINIPRESS) 2 MG capsule Take 2 mg by mouth 2 (two) times daily.   Yes [provider]  pregabalin (LYRICA) 100 MG capsule Take 100 mg by mouth in the morning, at noon, and at bedtime.  07/14/19  Yes [provider]  propranolol (INDERAL) 10 MG tablet TAKE ONE TABLET BY MOUTH AS NEEDED 10/15/20  Yes Turner, Eber Hong, MD  silver sulfADIAZINE (SILVADENE) 1 % cream Apply 1  application topically See admin instructions. Apply as directed once a day as directed to bilateral legs   Yes [provider]  spironolactone (ALDACTONE) 25 MG tablet Take 1 tablet (25 mg total) by mouth daily. 05/20/20  Yes Shirley Friar, PA-C  temazepam (RESTORIL) 15 MG capsule Take 15 mg by mouth at bedtime.    Yes [provider]  traZODone (DESYREL) 100 MG tablet Take 200 mg by mouth at bedtime.   Yes [provider]  Vitamin D, Ergocalciferol, (DRISDOL) 1.25 MG (50000 UNIT) CAPS capsule Take 50,000 Units by mouth every 7 (seven) days.   Yes [provider]  blood glucose meter kit and supplies Dispense based on patient and insurance preference. Use up to four times daily as directed. (FOR ICD-10 E10.9, E11.9). 11/09/17   Eugenie Filler, MD  lisinopril (ZESTRIL) 20 MG tablet Take 1 tablet (20 mg total) by mouth daily. Patient not taking: Reported on 10/17/2020 03/20/20   Sueanne Margarita, MD  LORazepam (ATIVAN) 1 MG tablet Take 2 tablets 30 minutes prior to leaving house on day of procedure and bring third tablet with you to office. Patient not taking: Reported on 10/17/2020 08/18/20   Angelia Mould, MD    Inpatient Medications: Scheduled Meds: .  aspirin EC  81 mg Oral Daily  . atorvastatin  40 mg Oral Daily  . Dulaglutide  3 mg Subcutaneous Weekly  . enoxaparin (LOVENOX) injection  90 mg Subcutaneous Q24H  . insulin aspart  0-15 Units Subcutaneous TID WC  . insulin aspart  0-5 Units Subcutaneous QHS  . lisinopril  20 mg Oral Daily  . metoprolol succinate  100 mg Oral BID  . potassium chloride  40 mEq Oral Daily  . potassium chloride  40 mEq Oral Once  . sodium chloride flush  3 mL Intravenous Q12H  . spironolactone  25 mg Oral Daily  . traZODone  200 mg Oral QHS   Continuous Infusions: . sodium chloride    . cefTRIAXone (ROCEPHIN)  IV    . furosemide 100 mg (10/17/20 0758)  . magnesium sulfate bolus IVPB     PRN Meds: sodium chloride, acetaminophen, oxyCODONE, sodium chloride flush  Allergies:    Allergies  Allergen Reactions  . Vancomycin Other (See Comments)    "Red Man Syndrome"  . Niacin And Related Other (See Comments)    Red man syndrome  . Tubersol [Tuberculin] Other (See Comments)    Reaction unknown  . Doxycycline Rash    Social History:   Social History   Socioeconomic History  . Marital status: Single    Spouse name: Not on file  . Number of children: 1  . Years of education: Not on file  . Highest education level: Not on file  Occupational History  . Occupation: retired  Tobacco Use  . Smoking status: Former Smoker    Quit date: 03/07/2016    Years since quitting: 4.6  . Smokeless tobacco: Never Used  Substance and Sexual Activity  . Alcohol use: Not Currently    Comment: rare  . Drug use: No  . Sexual activity: Not on file  Other Topics Concern  . Not on file  Social History Narrative  . Not on file   Social Determinants of Health   Financial Resource Strain: Not on file  Food Insecurity: Not on file  Transportation Needs: Not on file  Physical Activity: Not on file  Stress: Not on file  Social Connections: Not on file  Intimate  Partner Violence: Not on file    Family  History:    Family History  Problem Relation Age of Onset  . CAD Maternal Grandfather   . Diabetes Other   . Diabetes Mellitus II Neg Hx   . Colon cancer Neg Hx   . Esophageal cancer Neg Hx   . Inflammatory bowel disease Neg Hx   . Liver disease Neg Hx   . Pancreatic cancer Neg Hx   . Rectal cancer Neg Hx   . Stomach cancer Neg Hx      ROS:  Please see the history of present illness.  Review of Systems  Constitutional: Positive for malaise/fatigue. Negative for chills and fever.  HENT: Negative for sore throat.   Eyes: Negative for blurred vision and redness.  Respiratory: Positive for shortness of breath.   Cardiovascular: Positive for palpitations, orthopnea and PND. Negative for chest pain and leg swelling.  Gastrointestinal: Negative for melena, nausea and vomiting.  Genitourinary: Negative for hematuria.  Musculoskeletal: Negative for falls.  Neurological: Negative for dizziness and loss of consciousness.  Psychiatric/Behavioral: Negative for substance abuse.     Physical Exam/Data:   Vitals:   10/17/20 0439 10/17/20 0545 10/17/20 0600 10/17/20 0756  BP:  102/76 120/73 (!) 128/93  Pulse:  93 (!) 119 (!) 102  Resp: (!) _0 (!) 22  Temp:    98.5 F (36.9 C)  TempSrc:    Oral  SpO2:  94% 94% 96%  Weight:      Height:        Intake/Output Summary (Last 24 hours) at 10/17/2020 0837 Last data filed at 10/17/2020 0048 Gross per 24 hour  Intake --  Output 350 ml  Net -350 ml   Last 3 Weights 10/16/2020 08/20/2020 06/24/2020  Weight (lbs) 420 lb 423 lb 425 lb  Weight (kg) 190.511 kg 191.872 kg 192.779 kg     Body mass index is 53.92 kg/m.  General:  Morbidly obese, comfortable on 4L [place] HEENT: normal Neck: JVD difficult to assess Vascular: No carotid bruits; FA pulses 2+ bilaterally without bruits  Cardiac:  normal S1, S2; RRR; no murmur  Lungs:  Diminished with faint crackles bilaterally Abd: soft, nontender, no hepatomegaly  Ext: 2+ LE edema to the  thigh Musculoskeletal:  No deformities, BUE and BLE strength normal and equal Skin: warm and dry  Neuro:  CNs 2-12 intact, no focal abnormalities noted Psych:  Normal affect   EKG:  The EKG was personally reviewed and demonstrates:  Sinus tachycardia Telemetry:  Telemetry was personally reviewed and demonstrates:  Sinus tachycardia, PACs, unclear if true runs of Afib vs sinus tach with PACs  Relevant CV Studies: TTE 03/08/19: IMPRESSIONS  1. The left ventricle has normal systolic function, with an ejection  fraction of 55-60%. The cavity size was normal. There is moderate  concentric left ventricular hypertrophy. Left ventricular diastolic  Doppler parameters are consistent with  pseudonormalization. No evidence of left ventricular regional wall motion  abnormalities.  2. The right ventricle has normal systolic function. The cavity was  normal. There is no increase in right ventricular wall thickness.  3. Left atrial size was mildly dilated.  4. Right atrial size was mildly dilated.  5. Mild thickening of the mitral valve leaflet. Mild calcification of the  mitral valve leaflet. There is moderate to severe mitral annular  calcification present.  6. The aortic valve is tricuspid. Moderate thickening of the aortic  valve. Moderate calcification of the aortic valve. Mild stenosis  of the  aortic valve.  7. The aorta is normal in size and structure.  8. The aortic root is normal in size and structure.   Cath 01/17/18:  There is hyperdynamic left ventricular systolic function.  LV end diastolic pressure is normal.  The left ventricular ejection fraction is greater than 65% by visual estimate.  There is no mitral valve regurgitation.  There is no aortic valve stenosis.  Prox LAD to Mid LAD lesion is 40% stenosed.  Prox Cx to Mid Cx lesion is 20% stenosed.  Ost 2nd Mrg lesion is 20% stenosed.   Angiographically minimal coronary disease with ectatic RCA and  circumflex. Normal LVEDP and normal LV function/hyperdynamic.   Plan: Return to nursing unit for post catheterization care & TR band removal. Would be okay for discharge from cardiac standpoint after bedrest post TR band removal.  Defer further plans to primary team and consulting cardiologist.  [place] 03/12/19: Max 200 bpm 09:28pm, 07/28 Min 66 bpm 08:09am, 07/28 Avg 92 bpm Multiple episodes of SVT Fastest episode at 200 BPM for 14 beats Longest episode at 15 minutes with average HR of 137 bpm Symptoms of chest pain associated with sinus rhythm Artifact makes interpretation difficult   Laboratory Data:  High Sensitivity Troponin:   Recent Labs  Lab 10/16/20 2343 10/17/20 0143 10/17/20 0628  TROPONINIHS 14 18* 14     Chemistry Recent Labs  Lab 10/16/20 2343 10/16/20 2359 10/17/20 0347  NA 132* 132*  --   K 3.3* 3.2*  --   CL 91*  --   --   CO2 30  --   --   GLUCOSE 253*  --   --   BUN 17  --   --   CREATININE 1.08  --  1.01  CALCIUM 9.3  --   --   GFRNONAA >60  --  >60  ANIONGAP 11  --   --     Recent Labs  Lab 10/16/20 2343  PROT 7.4  ALBUMIN 3.7  AST 25  ALT 26  ALKPHOS 77  BILITOT 1.0   Hematology Recent Labs  Lab 10/16/20 2343 10/16/20 2359 10/17/20 0347  WBC 19.3*  --  25.7*  RBC 4.86  --  4.90  HGB 13.9 14.6 14.1  HCT 43.3 43.0 43.2  MCV 89.1  --  88.2  MCH 28.6  --  28.8  MCHC 32.1  --  32.6  RDW 14.6  --  14.5  PLT 288  --  281   BNP Recent Labs  Lab 10/16/20 2343  BNP 61.4    DDimer No results for input(s): DDIMER in the last 168 hours.   Radiology/Studies:  DG Chest Port 1 View  Result Date: 10/17/2020 CLINICAL DATA:  Shortness of breath EXAM: PORTABLE CHEST 1 VIEW COMPARISON:  Chest x-ray 02/04/2020, CT angio chest 09/09/2019 FINDINGS: Similar appearing cardiomegaly. The heart size and mediastinal contours are unchanged. Streaky airspace opacity of the lung bases. Increased interstitial markings. No pleural effusion. No  pneumothorax. No acute osseous abnormality. IMPRESSION: Pulmonary edema versus bronchitis. Electronically Signed   By: Iven Finn M.D.   On: 10/17/2020 00:00     Assessment and Plan:   #Acute on chronic diastolic heart failure: Patient with known history of diastolic heart failure now acutely decompensated with worsening DOE, LE edema and orthopnea. Unclear trigger but possible having episodes of Afib vs SVT (has history of SVT on previous monitor) with HR 120s at home. Initially required BiPAP now weaned to  [place]. Appears grossly overloaded on examination with NYHA class III symptoms. -Continue aggressive diuresis with lasix 156m IV BID -Continue lisinopril 286mdaily -Continue metop 10032mL BID -Continue spiro 65m42mily -Asked him to bring in home monitor to assess rhythm -Continue tele -Monitor I/Os and daily weights -Low Na diet  #Known history of SVT #Possible Afib: Monitor with possible runs of Afib however very difficult to determine if true Afib vs sinus tach with PACs and runs of SVT. Previous monitor in 2020 with SVT. Asked patient to bring in home monitor that was reportedly detecting Afib. -Review home monitor -Continue metop 100mg5m -Continue telemetry -If true Afib captured on tele or ECG, would merit AC  #[facility]: No evidence of ischemia and patient chest pain free. Minimal disease on cath in 2019 -Continue ASA and statin  #HTN: -Continue lisinopril 20mg 28my -Continue spiro 65mg d34m -Continue metop 100mg BI10mggressive diuresis  #HLD: -Continue lipitor  #Morbid Obesity: -Consider ozempic vs wegovy as out-patient  #DMII: -Management per primary   #CKD: Cr at baseline. -Continue diuresis   Risk Assessment/Risk Scores:        New York Heart Association (NYHA) Functional Class NYHA Class III        For questions or updates, please contact [facility] Please consult [email or web address] for contact info under     Signed, Yarethzy Croak Freada Bergeron12/2022 8:37 AM

## 2020-10-17 NOTE — ED Notes (Signed)
MD notified as pt is asking for more pain meds. Tylenol and oxycodone PRN given with mild relief. MD notified. Waiting for new orders.

## 2020-10-17 NOTE — Progress Notes (Signed)
PROGRESS NOTE    Rajveer Handler  LEX:[id number] DOB: 1956-01-05 DOA: 10/16/2020 PCP: Jenny Reichmann, PA-C (Inactive)   Brief Narrative: 65 year old with past medical history significant for hypertension, diabetes, hyperlipidemia, COPD, diastolic heart failure, venous stasis who presented via EMS complaining of shortness of breath.  Patient reported episode of shortness of breath for the last 2 or 3 days prior to admission worse at night.  He was evaluated by EMS and he was found to have oxygen saturation in the 70s.  He was placed on a normal breather mask and subsequently on BiPAP.  He received high-dose methylprednisolone on in route.  Evaluation in the ED chest x-ray show pulmonary edema, initial troponin was 14.  Patient admitted for heart failure exacerbation.   Assessment & Plan:   Principal Problem:   Acute on chronic diastolic CHF (congestive heart failure) (HCC) Active Problems:   Morbid obesity ([place])   Acute respiratory failure with hypoxia (HCC)   Diabetes mellitus type 2 in obese (HCC)   QT prolongation   Essential hypertension   Hypokalemia   1-Acute Hypoxic Respiratory Failure: Secondary to Acute Diastolic Heart Failure Exacerbation -Patient presented hypoxic oxygen saturation 70% initially required BiPAP and nonrebreather mask. -Currently on 6 L of oxygen. -Chest x-ray pulmonary edema -Continue with IV Lasix 100 twice daily. -Cardiology consulted. -BIPAP  at bedtime -On ACE and beta-blocker, spironolactone.   2-Diabetes type 2 hyperglycemia: -Hold Metformin while in the hospital -Suspect  blood sugar elevated secondary to IV Solu-Medrol received yesterday -Will start Lantus, meal coverage and continue with a sliding scale.  3-Hypertension: Continue with lisinopril  4-Hypokalemia: replete orally check magnesium  5-prolonged QT: PVC: IV magnesium order  6-Morbid obesity: Will need diet education 7-LE Cellulitis; worsening redness; started  Ceftriaxone 8-SVT/ ?  afib;  Metoprolol, replete mg.  Telemetry Cardiology consulted.   Estimated body mass index is 53.92 kg/m as calculated from the following:   Height as of this encounter: 6\' 2"  (1.88 m).   Weight as of this encounter: 190.5 kg.   DVT prophylaxis: Lovenox Code Status: DNR Family Communication: care discussed with patient Disposition Plan:  Status is: Inpatient  Remains inpatient appropriate because:IV treatments appropriate due to intensity of illness or inability to take PO   Dispo: The patient is from: Home              Anticipated d/c is to: Home              Patient currently is not medically stable to d/c.   Difficult to place patient No        Consultants:   Cardiology   Procedures:   ECHO  Antimicrobials:  Ceftriaxone  Subjective: He report breathing better. He is off BIPAP. On 6 L oxygen.  Report worsening redness LE. Pain   Objective: Vitals:   10/17/20 0439 10/17/20 0545 10/17/20 0600 10/17/20 0756  BP:  102/76 120/73 (!) 128/93  Pulse:  93 (!) 119 (!) 102  Resp: (!) 22 16 15  (!) 22  Temp:    98.5 F (36.9 C)  TempSrc:    Oral  SpO2:  94% 94% 96%  Weight:      Height:        Intake/Output Summary (Last 24 hours) at 10/17/2020 0833 Last data filed at 10/17/2020 0048 Gross per 24 hour  Intake --  Output 350 ml  Net -350 ml   Filed Weights   10/16/20 2342  Weight: (!) 190.5 kg    Examination:  General exam: Appears calm and comfortable  Respiratory system: Clear to auscultation. Respiratory effort normal. Cardiovascular system: S1 & S2 heard, RRR. No JVD, murmurs, rubs, gallops or clicks. No pedal edema. Gastrointestinal system: Abdomen is nondistended, soft and nontender. No organomegaly or masses felt. Normal bowel sounds heard. Central nervous system: Alert and oriented. No focal neurological deficits. Extremities: Symmetric 5 x 5 power. Skin: No rashes, lesions or ulcers Psychiatry: Judgement and insight  appear normal. Mood & affect appropriate.     Data Reviewed: I have personally reviewed following labs and imaging studies  CBC: Recent Labs  Lab 10/16/20 2343 10/16/20 2359 10/17/20 0347  WBC 19.3*  --  25.7*  NEUTROABS 17.5*  --   --   HGB 13.9 14.6 14.1  HCT 43.3 43.0 43.2  MCV 89.1  --  88.2  PLT 288  --  474   Basic Metabolic Panel: Recent Labs  Lab 10/16/20 2343 10/16/20 2359 10/17/20 0347  NA 132* 132*  --   K 3.3* 3.2*  --   CL 91*  --   --   CO2 30  --   --   GLUCOSE 253*  --   --   BUN 17  --   --   CREATININE 1.08  --  1.01  CALCIUM 9.3  --   --   MG  --   --  1.8   GFR: Estimated Creatinine Clearance: 132.9 mL/min (by C-G formula based on SCr of 1.01 mg/dL). Liver Function Tests: Recent Labs  Lab 10/16/20 2343  AST 25  ALT 26  ALKPHOS 77  BILITOT 1.0  PROT 7.4  ALBUMIN 3.7   No results for input(s): LIPASE, AMYLASE in the last 168 hours. No results for input(s): AMMONIA in the last 168 hours. Coagulation Profile: No results for input(s): INR, PROTIME in the last 168 hours. Cardiac Enzymes: No results for input(s): CKTOTAL, CKMB, CKMBINDEX, TROPONINI in the last 168 hours. BNP (last 3 results) No results for input(s): PROBNP in the last 8760 hours. HbA1C: Recent Labs    10/17/20 0347  HGBA1C 9.2*   CBG: Recent Labs  Lab 10/17/20 0744  GLUCAP 346*   Lipid Profile: Recent Labs    10/17/20 0347  CHOL 116  HDL 33*  LDLCALC 58  TRIG 127  CHOLHDL 3.5   Thyroid Function Tests: No results for input(s): TSH, T4TOTAL, FREET4, T3FREE, THYROIDAB in the last 72 hours. Anemia Panel: No results for input(s): VITAMINB12, FOLATE, FERRITIN, TIBC, IRON, RETICCTPCT in the last 72 hours. Sepsis Labs: No results for input(s): PROCALCITON, LATICACIDVEN in the last 168 hours.  Recent Results (from the past 240 hour(s))  SARS CORONAVIRUS 2 (TAT 6-24 HRS) Nasopharyngeal Nasopharyngeal Swab     Status: None   Collection Time: 10/16/20 11:42 PM    Specimen: Nasopharyngeal Swab  Result Value Ref Range Status   SARS Coronavirus 2 NEGATIVE NEGATIVE Final    Comment: (NOTE) SARS-CoV-2 target nucleic acids are NOT DETECTED.  The SARS-CoV-2 RNA is generally detectable in upper and lower respiratory specimens during the acute phase of infection. Negative results do not preclude SARS-CoV-2 infection, do not rule out co-infections with other pathogens, and should not be used as the sole basis for treatment or other patient management decisions. Negative results must be combined with clinical observations, patient history, and epidemiological information. The expected result is Negative.  Fact Sheet for Patients: SugarRoll.be  Fact Sheet for Healthcare Providers: https://www.woods-mathews.com/  This test is not yet approved or cleared by  the [employer] and  has been authorized for detection and/or diagnosis of SARS-CoV-2 by FDA under an Emergency Use Authorization (EUA). This EUA will remain  in effect (meaning this test can be used) for the duration of the COVID-19 declaration under Se ction 564(b)(1) of the Act, 21 U.S.C. section 360bbb-3(b)(1), unless the authorization is terminated or revoked sooner.  Performed at [facility], [address], [place], [place] [address]          Radiology Studies: DG Chest Port 1 View  Result Date: 10/17/2020 CLINICAL DATA:  Shortness of breath EXAM: PORTABLE CHEST 1 VIEW COMPARISON:  Chest x-ray 02/04/2020, CT angio chest 09/09/2019 FINDINGS: Similar appearing cardiomegaly. The heart size and mediastinal contours are unchanged. Streaky airspace opacity of the lung bases. Increased interstitial markings. No pleural effusion. No pneumothorax. No acute osseous abnormality. IMPRESSION: Pulmonary edema versus bronchitis. Electronically Signed   By: Iven Finn M.D.   On: 10/17/2020 00:00        Scheduled Meds: . aspirin EC  81 mg  Oral Daily  . atorvastatin  40 mg Oral Daily  . Dulaglutide  3 mg Subcutaneous Weekly  . enoxaparin (LOVENOX) injection  90 mg Subcutaneous Q24H  . insulin aspart  0-15 Units Subcutaneous TID WC  . insulin aspart  0-5 Units Subcutaneous QHS  . lisinopril  20 mg Oral Daily  . metoprolol succinate  100 mg Oral BID  . potassium chloride  40 mEq Oral Daily  . potassium chloride  40 mEq Oral Once  . sodium chloride flush  3 mL Intravenous Q12H  . spironolactone  25 mg Oral Daily  . traZODone  200 mg Oral QHS   Continuous Infusions: . sodium chloride    . cefTRIAXone (ROCEPHIN)  IV    . furosemide 100 mg (10/17/20 0758)  . magnesium sulfate bolus IVPB       LOS: 0 days    Time spent: 35 minutes.     Elmarie Shiley, MD [facility]   If 7PM-7AM, please contact night-coverage [email or web address]  10/17/2020, 8:33 AM

## 2020-10-17 NOTE — H&P (Signed)
History and Physical    Lawrence [name] EHM:[id number] DOB: 01-Jan-1956 DOA: 10/16/2020  PCP: Jenny Reichmann, PA-C (Inactive)   Patient coming from:  Home  Chief Complaint: SOB  HPI: Lawrence [name] is a 65 y.o. male with medical history significant for  hypertension, diabetes, hyperlipidemia, COPD, diastolic heart failure, venous stasis who presents by EMS with complaint of shortness of breath.  He has been having episodic shortness of breath for the last 2-3 days, but tonight the SOB was worse.  He denies cough and denies fever or chills.  He denies chest pain, heaviness, tightness, pressure.  Unfortunately, he is a very poor historian.  He does state that he weighs himself daily and has been gaining weight but cannot tell me how much weight he has gained.  He reportedly had hypoxia and oxygen saturation in the 70s when EMS initially evaluated him. He was placed on a nonrebreather mask with 100% oxygen with no improvement in oxygen saturation.  He was placed on CPAP and oxygen saturation improved to the 90s.  He was given methylprednisolone and magnesium en route to the hospital. He does state he has had trouble sleeping the past few nights due to being SOB if he laid down.    ED Course: CXR shows pulmonary edema. Pt was given lasix in the ER. Initial troponin was 14.   Review of Systems:  General: Denies  fever, chills, weight loss, night sweats.  Denies dizziness.   HENT: Denies head trauma, headache, denies change in hearing, tinnitus.  Denies nasal congestion or bleeding.  Denies sore throat, sores in mouth.   Eyes: Denies blurry vision, pain in eye, drainage.  Denies discoloration of eyes. Neck: Denies pain.  Denies swelling.  Denies pain with movement. Cardiovascular: Denies chest pain, palpitations.  Reports edema.  Reports orthopnea Respiratory: Reports shortness of breath. Denies cough.  Denies wheezing.  Denies sputum production Gastrointestinal: Denies abdominal pain, swelling.   Denies nausea, vomiting, diarrhea.  Denies melena.  Denies hematemesis. Musculoskeletal: Denies limitation of movement.  Denies deformity or swelling.  Denies pain.   Genitourinary: Denies pelvic pain.  Denies urinary frequency or hesitancy.  Denies dysuria.  Skin: Denies rash.  Denies petechiae, purpura, ecchymosis. Neurological: Denies headache.  Denies syncope.  Denies seizure activity.  Denies paresthesia.  Denies slurred speech, drooping face.  Denies visual change. Psychiatric: Denies depression, anxiety. Denies hallucinations.  Past Medical History:  Diagnosis Date  . Acute on chronic diastolic CHF (congestive heart failure) ([place]) 01/13/2018  . Acute respiratory failure with hypoxia ([place]) 01/13/2018  . Adenomatous colon polyp 2007  . Anasarca 01/31/2017  . Anemia   . Anxiety   . Arthritis   . Asthma   . Bilateral lower extremity edema 01/31/2017  . Bilateral lower leg cellulitis   . BPH without obstruction/lower urinary tract symptoms 02/22/2017  . Cellulitis 11/07/2017  . Cellulitis of lower extremity   . Chest pain   . Chronic cough   . Chronic diastolic CHF (congestive heart failure) ([place])   . Chronic venous stasis 03/07/2019  . COPD (chronic obstructive pulmonary disease) ([place])   . Coronary artery calcification seen on CAT scan   . Depression   . Diabetic neuropathy ([place]) 09/11/2019  . Hematuria 02/22/2017  . History of anemia 12/26/2018  . History of cardiac cath    a.  01/2018 with angiographically minimal coronary disease with  up to 40% prox LAD and 20% mid LCx and OM2 with normal LVF and normal LVEDP..  . History  of colon polyps 08/24/2018  . Hypertension   . Leukocytosis 08/18/2018  . Morbid obesity ([place])   . New onset type 2 diabetes mellitus ([place]) 11/08/2017  . OSA (obstructive sleep apnea)   . Pain due to onychomycosis of toenails of both feet 09/11/2019  . Peripheral neuropathy 02/22/2017  . Physical deconditioning 02/22/2017  . Primary osteoarthritis, left shoulder  03/05/2017  . PTSD (post-traumatic stress disorder)   . Pure hypercholesterolemia   . QT prolongation 03/07/2019  . Right foot pain 01/13/2018  . Shortness of breath   . Sinus tachycardia 03/07/2019  . Sleep apnea    CPAP  . Type 2 diabetes mellitus with vascular disease ([place]) 09/11/2019    Past Surgical History:  Procedure Laterality Date  . ENDOVENOUS ABLATION SAPHENOUS VEIN W/ LASER Right 08/20/2020   endovenous laser ablation right greater saphenous vein by Gae Gallop MD   . [place]     left knee replacement x 2  . KNEE ARTHROSCOPY Bilateral   . LEFT HEART CATH AND CORONARY ANGIOGRAPHY N/A 01/17/2018   Procedure: LEFT HEART CATH AND CORONARY ANGIOGRAPHY;  Surgeon: Leonie Man, MD;  Location: [facility];  Service: Cardiovascular;  Laterality: N/A;  . UMBILICAL HERNIA REPAIR      Social History  reports that he quit smoking about 4 years ago. He has never used smokeless tobacco. He reports previous alcohol use. He reports that he does not use drugs.  Allergies  Allergen Reactions  . Vancomycin Other (See Comments)    "Red Man Syndrome"  . Tubersol [Tuberculin] Other (See Comments)    Reaction unknown  . Doxycycline Rash    Family History  Problem Relation Age of Onset  . CAD Maternal Grandfather   . Diabetes Other   . Diabetes Mellitus II Neg Hx   . Colon cancer Neg Hx   . Esophageal cancer Neg Hx   . Inflammatory bowel disease Neg Hx   . Liver disease Neg Hx   . Pancreatic cancer Neg Hx   . Rectal cancer Neg Hx   . Stomach cancer Neg Hx      Prior to Admission medications   Medication Sig Start Date End Date Taking? Authorizing Provider  acetaminophen (TYLENOL) 650 MG CR tablet Take 1,300 mg by mouth every 8 (eight) hours as needed for pain.    [provider]  albuterol (VENTOLIN HFA) 108 (90 Base) MCG/ACT inhaler Inhale into the lungs. 03/10/20   [provider]  allopurinol (ZYLOPRIM) 100 MG tablet Take 200 mg by mouth  daily.    [provider]  ARIPiprazole (ABILIFY) 5 MG tablet Take 5 mg by mouth at bedtime.     [provider]  atorvastatin (LIPITOR) 40 MG tablet TAKE ONE TABLET BY MOUTH DAILY AT 6PM 05/29/20   Sueanne Margarita, MD  blood glucose meter kit and supplies Dispense based on patient and insurance preference. Use up to four times daily as directed. (FOR ICD-10 E10.9, E11.9). 11/09/17   Eugenie Filler, MD  cetirizine (ZYRTEC) 10 MG tablet Take 10 mg by mouth at bedtime.     [provider]  colchicine 0.6 MG tablet Take 0.6 mg by mouth as needed (gout).  Patient not taking: No sig reported 08/27/19   [provider]  Dulaglutide (TRULICITY) 3 LD/3.5TS SOPN Inject 3 mg into the skin once a week.    [provider]  ferrous sulfate 324 (65 Fe) MG TBEC Take 324 mg by mouth daily  with breakfast.     [provider]  fluticasone (FLONASE) 50 MCG/ACT nasal spray Place 2 sprays into both nostrils daily.     [provider]  furosemide (LASIX) 80 MG tablet Take 2 tablets (160 mg total) every morning and 1 tablet (80 mg total) every evening. 03/20/20   Sueanne Margarita, MD  lisinopril (ZESTRIL) 20 MG tablet Take 1 tablet (20 mg total) by mouth daily. 03/20/20   Sueanne Margarita, MD  loperamide (IMODIUM) 2 MG capsule Take 2 mg by mouth 2 (two) times a day.    [provider]  LORazepam (ATIVAN) 1 MG tablet Take 2 tablets 30 minutes prior to leaving house on day of procedure and bring third tablet with you to office. 08/18/20   Angelia Mould, MD  metFORMIN (GLUCOPHAGE) 1000 MG tablet Take 1,000 mg by mouth 2 (two) times daily with a meal.    [provider]  metoprolol succinate (TOPROL-XL) 100 MG 24 hr tablet Take 1 tablet (100 mg total) by mouth in the morning and at bedtime. Take with or immediately following a meal. 05/20/20   Tillery, Satira Mccallum, PA-C  omeprazole (PRILOSEC) 20 MG capsule Take 20 mg by mouth every  morning. 05/29/20   [provider]  prazosin (MINIPRESS) 2 MG capsule Take 4 mg by mouth at bedtime.    [provider]  pregabalin (LYRICA) 100 MG capsule Take 100 mg by mouth in the morning, at noon, and at bedtime.  07/14/19   [provider]  propranolol (INDERAL) 10 MG tablet TAKE ONE TABLET BY MOUTH AS NEEDED 10/15/20   Sueanne Margarita, MD  silver sulfADIAZINE (SILVADENE) 1 % cream Apply 1 application topically See admin instructions. Apply as directed once a day as directed to bilateral legs    [provider]  spironolactone (ALDACTONE) 25 MG tablet Take 1 tablet (25 mg total) by mouth daily. 05/20/20   Shirley Friar, PA-C  temazepam (RESTORIL) 15 MG capsule Take 15 mg by mouth at bedtime.     [provider]  traZODone (DESYREL) 100 MG tablet Take 200 mg by mouth at bedtime.    [provider]  Trolamine Salicylate (ASPERCREME EX) Apply 1 application topically 2 (two) times daily as needed (pain and inflammation).    [provider]  venlafaxine XR (EFFEXOR-XR) 150 MG 24 hr capsule Take 150 mg by mouth daily with breakfast.    [provider]  Vitamin D, Ergocalciferol, (DRISDOL) 1.25 MG (50000 UNIT) CAPS capsule Take 50,000 Units by mouth every 7 (seven) days.    [provider]    Physical Exam: Vitals:   10/17/20 0200 10/17/20 0215 10/17/20 0230 10/17/20 0245  BP: 120/88 (!) 142/84 (!) 115/93 (!) 128/91  Pulse: (!) 114 (!) 106 (!) 114 (!) 108  Resp: (!) 21 18 (!) 24 20  Temp:      TempSrc:      SpO2: 99% 98% 99% 96%  Weight:      Height:        Constitutional: NAD, calm, comfortable Vitals:   10/17/20 0200 10/17/20 0215 10/17/20 0230 10/17/20 0245  BP: 120/88 (!) 142/84 (!) 115/93 (!) 128/91  Pulse: (!) 114 (!) 106 (!) 114 (!) 108  Resp: (!) 21 18 (!) 24 20  Temp:      TempSrc:      SpO2: 99% 98% 99% 96%  Weight:      Height:       General: WDWN,  Alert and oriented x3.  Eyes:  EOMI, PERRL, conjunctivae normal.  Sclera nonicteric HENT:  [place]/AT, external ears normal.  Nares patent without epistasis.  Mucous membranes are moist.  Neck: Soft, normal range of motion, supple, no masses, no thyromegaly. Trachea midline Respiratory: Diminished breath sounds bilaterally with diffuse rales.  Bibasilar crackles.  No wheezing. Normal respiratory effort. No accessory muscle use.  Cardiovascular: Regular rate and rhythm, no murmurs / rubs / gallops. Has lower extremity edema. 1+ pedal pulses. Abdomen: Soft, no tenderness, nondistended, no rebound or guarding. Morbidly obese.  No masses palpated. Bowel sounds normoactive Musculoskeletal: FROM. nocyanosis. No joint deformity upper and lower extremities. Normal muscle tone.  Skin: Warm, dry, intact no rashes, lesions, ulcers. No induration Neurologic: CN 2-12 grossly intact.  Normal speech.  Sensation intact, Strength 5/5 in all extremities.   Psychiatric: Normal judgment and insight.  Normal mood.    Labs on Admission: I have personally reviewed following labs and imaging studies  CBC: Recent Labs  Lab 10/16/20 2343 10/16/20 2359  WBC 19.3*  --   NEUTROABS 17.5*  --   HGB 13.9 14.6  HCT 43.3 43.0  MCV 89.1  --   PLT 288  --     Basic Metabolic Panel: Recent Labs  Lab 10/16/20 2343 10/16/20 2359  NA 132* 132*  K 3.3* 3.2*  CL 91*  --   CO2 30  --   GLUCOSE 253*  --   BUN 17  --   CREATININE 1.08  --   CALCIUM 9.3  --     GFR: Estimated Creatinine Clearance: 124.3 mL/min (by C-G formula based on SCr of 1.08 mg/dL).  Liver Function Tests: Recent Labs  Lab 10/16/20 2343  AST 25  ALT 26  ALKPHOS 77  BILITOT 1.0  PROT 7.4  ALBUMIN 3.7    Urine analysis:    Component Value Date/Time   COLORURINE YELLOW 12/04/2019 1839   APPEARANCEUR CLEAR 12/04/2019 1839   LABSPEC 1.014 12/04/2019 1839   PHURINE 6.0 12/04/2019 1839   GLUCOSEU NEGATIVE 12/04/2019 1839   HGBUR NEGATIVE 12/04/2019 1839   BILIRUBINUR  NEGATIVE 12/04/2019 1839   KETONESUR NEGATIVE 12/04/2019 1839   PROTEINUR NEGATIVE 12/04/2019 1839   NITRITE NEGATIVE 12/04/2019 1839   LEUKOCYTESUR NEGATIVE 12/04/2019 1839    Radiological Exams on Admission: DG Chest Port 1 View  Result Date: 10/17/2020 CLINICAL DATA:  Shortness of breath EXAM: PORTABLE CHEST 1 VIEW COMPARISON:  Chest x-ray 02/04/2020, CT angio chest 09/09/2019 FINDINGS: Similar appearing cardiomegaly. The heart size and mediastinal contours are unchanged. Streaky airspace opacity of the lung bases. Increased interstitial markings. No pleural effusion. No pneumothorax. No acute osseous abnormality. IMPRESSION: Pulmonary edema versus bronchitis. Electronically Signed   By: Iven Finn M.D.   On: 10/17/2020 00:00    EKG: Independently reviewed.  EKG shows sinus tachycardia with occasional PVC.  Nonspecific ST changes but no acute ST elevation.  QTc mildly prolonged at 483  Assessment/Plan Principal Problem:   Acute on chronic diastolic CHF (congestive heart failure) Lawrence [name] is admitted to progressive care unit.  Patient initially required BiPAP to maintain oxygen saturation.  He has not been weaned off the BiPAP and is on nonrebreather. Was given Lasix in the emergency room for diuresis. Monitor urine output, volume status and daily weights. Check serial troponin levels.  Initial BNP was 64.  Chest x-ray revealed pulmonary edema Continue beta-blocker therapy with metoprolol.  Continue ACE inhibitor therapy with lisinopril. Continue statin.  Check lipid panel  Active Problems:   Acute respiratory failure with hypoxia  Initially required Bipap therapy. Is now on NRB and tolerating with good O2 sats.     Diabetes mellitus type 2 in obese  Continue metformin. Basal insulin provided. Monitor BS with meals and bedtimes. SSI as needed for glycemic control. Check HgbA1c level.     Essential hypertension Continue home dose of metoprolol and lisinorpril.  Monitor  BP    Hypokalemia Supplemental potassium given in ER. Will give potassium with increased dose of lasix daily.  Check magnesium level and replete if low.  Monitor electrolytes and renal function on labs in am    QT prolongation Avoid medications which are further prolong QT interval    Morbid obesity  Chronic.  Patient will need to follow-up with PCP for dietary interventions for weight loss    DVT prophylaxis: Lovenox for DVT prophylaxis Code Status:   DNR. Pt states he would want to be resuscitated so his organs could be donated but that would be the only reason.  If he cannot donate his organs he does not want to be resuscitated Family Communication:  Diagnosis and plan discussed with patient.  Patient verbalized understanding agrees with plan.  Further recommendations to follow as clinically indicated Disposition Plan:   Patient is from:  Home  Anticipated DC to:  Home  Anticipated DC date:  Anticipated greater than 2 midnight stay in the hospital to treat acute condition  Anticipated DC barriers: No barriers to discharge identified at this time  Admission status:  Inpatient  Yevonne Aline Chotiner MD [facility]  How to contact the [facility] Attending or Consulting provider [place] or covering provider during after hours [place], for this patient?   1. Check the care team in [facility] and look for a) attending/consulting [facility] provider listed and b) the [facility] team listed 2. Log into [email or web address] and use [place]'s universal password to access. If you do not have the password, please contact the hospital operator. 3. Locate the [facility] provider you are looking for under [facility] and page to a number that you can be directly reached. 4. If you still have difficulty reaching the provider, please page the [facility] (Director on Call) for the Hospitalists listed on amion for assistance.  10/17/2020, 3:13 AM

## 2020-10-18 DIAGNOSIS — E876 Hypokalemia: Secondary | ICD-10-CM

## 2020-10-18 DIAGNOSIS — I5033 Acute on chronic diastolic (congestive) heart failure: Secondary | ICD-10-CM | POA: Diagnosis not present

## 2020-10-18 DIAGNOSIS — J9601 Acute respiratory failure with hypoxia: Secondary | ICD-10-CM

## 2020-10-18 LAB — BASIC METABOLIC PANEL
Anion gap: 8 (ref 5–15)
BUN: 29 mg/dL — ABNORMAL HIGH (ref 8–23)
CO2: 32 mmol/L (ref 22–32)
Calcium: 8.5 mg/dL — ABNORMAL LOW (ref 8.9–10.3)
Chloride: 87 mmol/L — ABNORMAL LOW (ref 98–111)
Creatinine, Ser: 0.94 mg/dL (ref 0.61–1.24)
GFR, Estimated: >60 mL/min (ref >60)
Glucose, Bld: 286 mg/dL — ABNORMAL HIGH (ref 70–99)
Potassium: 3.7 mmol/L (ref 3.5–5.1)
Sodium: 127 mmol/L — ABNORMAL LOW (ref 135–145)

## 2020-10-18 LAB — CBC
HCT: 37.4 % — ABNORMAL LOW (ref 39.0–52.0)
Hemoglobin: 12.5 g/dL — ABNORMAL LOW (ref 13.0–17.0)
MCH: 29.3 pg (ref 26.0–34.0)
MCHC: 33.4 g/dL (ref 30.0–36.0)
MCV: 87.6 fL (ref 80.0–100.0)
Platelets: 276 10*3/uL (ref 150–400)
RBC: 4.27 MIL/uL (ref 4.22–5.81)
RDW: 14.8 % (ref 11.5–15.5)
WBC: 20.1 10*3/uL — ABNORMAL HIGH (ref 4.0–10.5)
nRBC: 0 % (ref 0.0–0.2)

## 2020-10-18 LAB — GLUCOSE, CAPILLARY
Glucose-Capillary: 267 mg/dL — ABNORMAL HIGH (ref 70–99)
Glucose-Capillary: 269 mg/dL — ABNORMAL HIGH (ref 70–99)
Glucose-Capillary: 253 mg/dL — ABNORMAL HIGH (ref 70–99)
Glucose-Capillary: 278 mg/dL — ABNORMAL HIGH (ref 70–99)

## 2020-10-18 MED ORDER — FLUTICASONE PROPIONATE 50 MCG/ACT NA SUSP
2.0000 | Freq: Every day | NASAL | Status: DC
  Administered 2020-10-19 – 2020-10-26 (×8): 2 via NASAL
  Filled 2020-10-18 (×2): qty 16

## 2020-10-18 MED ORDER — SULFAMETHOXAZOLE-TRIMETHOPRIM 800-160 MG PO TABS
1.0000 | ORAL_TABLET | Freq: Two times a day (BID) | ORAL | Status: DC
  Administered 2020-10-18 (×2): 1 via ORAL
  Filled 2020-10-18 (×3): qty 1

## 2020-10-18 MED ORDER — METOLAZONE 5 MG PO TABS
5.0000 mg | ORAL_TABLET | Freq: Once | ORAL | Status: AC
  Administered 2020-10-18: 5 mg via ORAL
  Filled 2020-10-18: qty 1

## 2020-10-18 MED ORDER — ALBUTEROL SULFATE HFA 108 (90 BASE) MCG/ACT IN AERS
1.0000 | INHALATION_SPRAY | Freq: Four times a day (QID) | RESPIRATORY_TRACT | Status: DC
  Administered 2020-10-18 – 2020-10-19 (×2): 2 via RESPIRATORY_TRACT
  Filled 2020-10-18: qty 6.7

## 2020-10-18 MED ORDER — SODIUM CHLORIDE 0.9 % IV SOLN
INTRAVENOUS | Status: DC | PRN
  Administered 2020-10-18: 1000 mL via INTRAVENOUS

## 2020-10-18 MED ORDER — NITROGLYCERIN 0.4 MG SL SUBL
SUBLINGUAL_TABLET | SUBLINGUAL | Status: AC
  Filled 2020-10-18: qty 3

## 2020-10-18 MED ORDER — INSULIN GLARGINE 100 UNIT/ML ~~LOC~~ SOLN
20.0000 [IU] | Freq: Every day | SUBCUTANEOUS | Status: DC
  Filled 2020-10-18: qty 0.2

## 2020-10-18 MED ORDER — INSULIN GLARGINE 100 UNIT/ML ~~LOC~~ SOLN
5.0000 [IU] | Freq: Once | SUBCUTANEOUS | Status: AC
  Administered 2020-10-18: 5 [IU] via SUBCUTANEOUS
  Filled 2020-10-18 (×2): qty 0.05

## 2020-10-18 MED ORDER — FUROSEMIDE 10 MG/ML IJ SOLN
120.0000 mg | Freq: Two times a day (BID) | INTRAVENOUS | Status: DC
  Administered 2020-10-18 – 2020-10-20 (×4): 120 mg via INTRAVENOUS
  Filled 2020-10-18 (×3): qty 10
  Filled 2020-10-18: qty 12
  Filled 2020-10-18: qty 10
  Filled 2020-10-18 (×2): qty 12

## 2020-10-18 MED ORDER — PREGABALIN 100 MG PO CAPS
100.0000 mg | ORAL_CAPSULE | Freq: Every day | ORAL | Status: DC
  Administered 2020-10-18 – 2020-10-20 (×3): 100 mg via ORAL
  Filled 2020-10-18 (×3): qty 1

## 2020-10-18 NOTE — Plan of Care (Signed)
  Problem: Education: Goal: Knowledge of General Education information will improve Description Including pain rating scale, medication(s)/side effects and non-pharmacologic comfort measures Outcome: Progressing   Problem: Health Behavior/Discharge Planning: Goal: Ability to manage health-related needs will improve Outcome: Progressing   

## 2020-10-18 NOTE — Progress Notes (Addendum)
PROGRESS NOTE    Lawrence [name]  NTI:[id number] DOB: [birth year] DOA: 10/16/2020 PCP: Jenny Reichmann, PA-C (Inactive)   Brief Narrative: 65 year old with past medical history significant for hypertension, diabetes, hyperlipidemia, COPD, diastolic heart failure, venous stasis who presented via EMS complaining of shortness of breath.  Patient reported episode of shortness of breath for the last 2 or 3 days prior to admission worse at night.  He was evaluated by EMS and he was found to have oxygen saturation in the 70s.  He was placed on a normal breather mask and subsequently on BiPAP.  He received high-dose methylprednisolone on in route.  Evaluation in the ED chest x-ray show pulmonary edema, initial troponin was 14.  Patient admitted for heart failure exacerbation.   Assessment & Plan:   Principal Problem:   Acute on chronic diastolic CHF (congestive heart failure) (HCC) Active Problems:   Morbid obesity ([place])   Acute respiratory failure with hypoxia (HCC)   Diabetes mellitus type 2 in obese (HCC)   QT prolongation   Essential hypertension   Hypokalemia   1-Acute Hypoxic Respiratory Failure: Secondary to Acute Diastolic Heart Failure Exacerbation -Patient presented hypoxic oxygen saturation 70% initially required BiPAP and nonrebreather mask. -Currently on 6 L of oxygen. -Chest x-ray pulmonary edema -Continue with IV Lasix 100 twice daily. -Cardiology consulted. Appreciate assistance.  -BIPAP  at bedtime -SBP low, hold ACE and metoprolol.  -ECHO Ef 55% Moderate Aortic stenosis.  -Negative 1.6 L. Weight; 420--413  2-Diabetes type 2 hyperglycemia: -Hold Metformin while in the hospital -Suspect  blood sugar elevated secondary to IV Solu-Medrol received by EMS -Increase lantus 20 units, Meals coverage SSI>   3-Hypertension: BP low, hold lisinopril, metoprolol.   4-Hypokalemia: replaced.   5-prolonged QT: PVC: received mg, k replaced.   6-Morbid obesity: diet  education.  7-LE Cellulitis; worsening redness; started Ceftriaxone, added Bactrim.  WBC trending down. Suspect leukocytosis is also elevated secondary to steroids.   8-SVT/ ?  afib;  Metoprolol on hold due to hypotension.  Telemetry Cardiology consulted.  9-COPD; Resume Inhaler.  10-Hypotension; BP low; hold lisinopril and metoprolol.   Estimated body mass index is 53.1 kg/m as calculated from the following:   Height as of this encounter: 6\' 2"  (1.88 m).   Weight as of this encounter: 187.6 kg.   DVT prophylaxis: Lovenox Code Status: DNR Family Communication: care discussed with patient Disposition Plan:  Status is: Inpatient  Remains inpatient appropriate because:IV treatments appropriate due to intensity of illness or inability to take PO   Dispo: The patient is from: Home              Anticipated d/c is to: Home              Patient currently is not medically stable to d/c.   Difficult to place patient No        Consultants:   Cardiology   Procedures:   ECHO  Antimicrobials:  Ceftriaxone  Subjective: He is 5 L oxygen. He is breathing better.  He uses BIPAP at hs.    Objective: Vitals:   10/17/20 2014 10/18/20 0025 10/18/20 0442 10/18/20 0719  BP: 106/71 104/75 (!) 88/53 100/75  Pulse: 88 85 76 78  Resp: 16 13 16 19   Temp:  97.8 F (36.6 C) 98.5 F (36.9 C) 98.4 F (36.9 C)  TempSrc:  Oral Oral Oral  SpO2: 95% 92% 95% 92%  Weight:  (!) 187.6 kg    Height:  Intake/Output Summary (Last 24 hours) at 10/18/2020 0919 Last data filed at 10/18/2020 0445 Gross per 24 hour  Intake 906.09 ml  Output 1775 ml  Net -868.91 ml   Filed Weights   10/16/20 2342 10/17/20 1545 10/18/20 0025  Weight: (!) 190.5 kg (!) 186.5 kg (!) 187.6 kg    Examination:  General exam: NAD Respiratory system: Decreased breath sound, BL crackles.  Cardiovascular system: S 1, S 2 RRR Gastrointestinal system: BS present, soft, nt Central nervous system: Alert,  conversant Extremities: Symmetric power Skin: LE with edema and redness,   Data Reviewed: I have personally reviewed following labs and imaging studies  CBC: Recent Labs  Lab 10/16/20 2343 10/16/20 2359 10/17/20 0347 10/18/20 0446  WBC 19.3*  --  25.7* 20.1*  NEUTROABS 17.5*  --   --   --   HGB 13.9 14.6 14.1 12.5*  HCT 43.3 43.0 43.2 37.4*  MCV 89.1  --  88.2 87.6  PLT 288  --  281 401   Basic Metabolic Panel: Recent Labs  Lab 10/16/20 2343 10/16/20 2359 10/17/20 0347 10/17/20 0701 10/17/20 1625 10/18/20 0446  NA 132* 132*  --  130*  --  127*  K 3.3* 3.2*  --  3.3*  --  3.7  CL 91*  --   --  88*  --  87*  CO2 30  --   --  30  --  32  GLUCOSE 253*  --   --  341* 447* 286*  BUN 17  --   --  18  --  29*  CREATININE 1.08  --  1.01 1.01  --  0.94  CALCIUM 9.3  --   --  9.0  --  8.5*  MG  --   --  1.8 1.8  --   --    GFR: Estimated Creatinine Clearance: 141.5 mL/min (by C-G formula based on SCr of 0.94 mg/dL). Liver Function Tests: Recent Labs  Lab 10/16/20 2343  AST 25  ALT 26  ALKPHOS 77  BILITOT 1.0  PROT 7.4  ALBUMIN 3.7   No results for input(s): LIPASE, AMYLASE in the last 168 hours. No results for input(s): AMMONIA in the last 168 hours. Coagulation Profile: No results for input(s): INR, PROTIME in the last 168 hours. Cardiac Enzymes: No results for input(s): CKTOTAL, CKMB, CKMBINDEX, TROPONINI in the last 168 hours. BNP (last 3 results) No results for input(s): PROBNP in the last 8760 hours. HbA1C: Recent Labs    10/17/20 0347  HGBA1C 9.2*   CBG: Recent Labs  Lab 10/17/20 0744 10/17/20 1221 10/17/20 1547 10/17/20 2102 10/18/20 0607  GLUCAP 346* 388* 455* 359* 267*   Lipid Profile: Recent Labs    10/17/20 0347  CHOL 116  HDL 33*  LDLCALC 58  TRIG 127  CHOLHDL 3.5   Thyroid Function Tests: No results for input(s): TSH, T4TOTAL, FREET4, T3FREE, THYROIDAB in the last 72 hours. Anemia Panel: No results for input(s): VITAMINB12,  FOLATE, FERRITIN, TIBC, IRON, RETICCTPCT in the last 72 hours. Sepsis Labs: No results for input(s): PROCALCITON, LATICACIDVEN in the last 168 hours.  Recent Results (from the past 240 hour(s))  SARS CORONAVIRUS 2 (TAT 6-24 HRS) Nasopharyngeal Nasopharyngeal Swab     Status: None   Collection Time: 10/16/20 11:42 PM   Specimen: Nasopharyngeal Swab  Result Value Ref Range Status   SARS Coronavirus 2 NEGATIVE NEGATIVE Final    Comment: (NOTE) SARS-CoV-2 target nucleic acids are NOT DETECTED.  The SARS-CoV-2 RNA is  generally detectable in upper and lower respiratory specimens during the acute phase of infection. Negative results do not preclude SARS-CoV-2 infection, do not rule out co-infections with other pathogens, and should not be used as the sole basis for treatment or other patient management decisions. Negative results must be combined with clinical observations, patient history, and epidemiological information. The expected result is Negative.  Fact Sheet for Patients: SugarRoll.be  Fact Sheet for Healthcare Providers: https://www.woods-mathews.com/  This test is not yet approved or cleared by the [place] FDA and  has been authorized for detection and/or diagnosis of SARS-CoV-2 by FDA under an Emergency Use Authorization (EUA). This EUA will remain  in effect (meaning this test can be used) for the duration of the COVID-19 declaration under Se ction 564(b)(1) of the Act, 21 U.S.C. section 360bbb-3(b)(1), unless the authorization is terminated or revoked sooner.  Performed at [facility], [place] [address] [place], [place] [address]          Radiology Studies: DG Chest Port 1 View  Result Date: 10/17/2020 CLINICAL DATA:  Shortness of breath EXAM: PORTABLE CHEST 1 VIEW COMPARISON:  Chest x-ray 02/04/2020, CT angio chest 09/09/2019 FINDINGS: Similar appearing cardiomegaly. The heart size and mediastinal contours are  unchanged. Streaky airspace opacity of the lung bases. Increased interstitial markings. No pleural effusion. No pneumothorax. No acute osseous abnormality. IMPRESSION: Pulmonary edema versus bronchitis. Electronically Signed   By: Iven Finn M.D.   On: 10/17/2020 00:00   ECHOCARDIOGRAM COMPLETE  Result Date: 10/17/2020    ECHOCARDIOGRAM REPORT   Patient Name:   Lawrence [name] Date of Exam: 10/17/2020 Medical Rec #:  [id number]      Height:       74.0 in Accession #:    [id number]     Weight:       420.0 lb Date of Birth:  [birth year]      BSA:          2.979 m Patient Age:    77 years       BP:           124/71 mmHg Patient Gender: M              HR:           120 bpm. Exam Location:  Inpatient Procedure: 2D Echo, Cardiac Doppler and Color Doppler Indications:    CHF  History:        Patient has prior history of Echocardiogram examinations, most                 recent 03/08/2019. CHF, Arrythmias:Atrial Fibrillation,                 Signs/Symptoms:Shortness of Breath; Risk Factors:Diabetes and                 Hypertension.  Sonographer:    Rowena Referring Phys: [id number] [place]  1. Left ventricular ejection fraction, by estimation, is 60 to 65%. The left ventricle has normal function. The left ventricle has no regional wall motion abnormalities. Left ventricular diastolic function could not be evaluated.  2. Right ventricular systolic function is normal. The right ventricular size is normal. Tricuspid regurgitation signal is inadequate for assessing PA pressure.  3. The mitral valve is grossly normal. No evidence of mitral valve regurgitation. No evidence of mitral stenosis.  4. The aortic valve is calcified. Aortic valve regurgitation is not visualized. Moderate aortic valve stenosis. Aortic valve area, by  VTI measures 1.38 cm. Aortic valve mean gradient measures 19.2 mmHg. Aortic valve Vmax measures 3.05 m/s. Comparison(s): Changes from prior study are noted. AFib is now  present. Aortic stenosis is now moderate. FINDINGS  Left Ventricle: Left ventricular ejection fraction, by estimation, is 60 to 65%. The left ventricle has normal function. The left ventricle has no regional wall motion abnormalities. The left ventricular internal cavity size was normal in size. There is  no left ventricular hypertrophy. Left ventricular diastolic function could not be evaluated due to atrial fibrillation. Left ventricular diastolic function could not be evaluated. Right Ventricle: The right ventricular size is normal. No increase in right ventricular wall thickness. Right ventricular systolic function is normal. Tricuspid regurgitation signal is inadequate for assessing PA pressure. Left Atrium: Left atrial size was normal in size. Right Atrium: Right atrial size was normal in size. Pericardium: Trivial pericardial effusion is present. Presence of pericardial fat pad. Mitral Valve: The mitral valve is grossly normal. No evidence of mitral valve regurgitation. No evidence of mitral valve stenosis. Tricuspid Valve: The tricuspid valve is grossly normal. Tricuspid valve regurgitation is not demonstrated. No evidence of tricuspid stenosis. Aortic Valve: The aortic valve is calcified. Aortic valve regurgitation is not visualized. Moderate aortic stenosis is present. Aortic valve mean gradient measures 19.2 mmHg. Aortic valve peak gradient measures 37.1 mmHg. Aortic valve area, by VTI measures 1.38 cm. Pulmonic Valve: The pulmonic valve was grossly normal. Pulmonic valve regurgitation is not visualized. No evidence of pulmonic stenosis. Aorta: The aortic root and ascending aorta are structurally normal, with no evidence of dilitation. Venous: The inferior vena cava was not well visualized. IAS/Shunts: The atrial septum is grossly normal. EKG: Rhythm strip during this exam demostrated atrial fibrillation.  LEFT VENTRICLE PLAX 2D LVIDd:         5.30 cm     Diastology LVIDs:         2.80 cm     LV e'  medial:    10.20 cm/s LV PW:         1.40 cm     LV E/e' medial:  6.5 LV IVS:        0.90 cm     LV e' lateral:   4.68 cm/s LVOT diam:     2.40 cm     LV E/e' lateral: 14.3 LV SV:         75 LV SV Index:   25 LVOT Area:     4.52 cm  LV Volumes (MOD) LV vol d, MOD A2C: 60.6 ml LV vol d, MOD A4C: 78.5 ml LV vol s, MOD A2C: 23.1 ml LV vol s, MOD A4C: 25.7 ml LV SV MOD A2C:     37.5 ml LV SV MOD A4C:     78.5 ml LV SV MOD BP:      42.3 ml RIGHT VENTRICLE RV S prime:     19.90 cm/s TAPSE (M-mode): 1.8 cm LEFT ATRIUM             Index       RIGHT ATRIUM           Index LA Vol (A2C):   25.4 ml 8.53 ml/m  RA Area:     28.30 cm LA Vol (A4C):   94.8 ml 31.82 ml/m RA Volume:   94.20 ml  31.62 ml/m LA Biplane Vol: 50.1 ml 16.82 ml/m  AORTIC VALVE  PULMONIC VALVE AV Area (Vmax):    1.41 cm     PV Vmax:       0.67 m/s AV Area (Vmean):   1.50 cm     PV Vmean:      57.250 cm/s AV Area (VTI):     1.38 cm     PV VTI:        0.202 m AV Vmax:           304.60 cm/s  PV Peak grad:  1.8 mmHg AV Vmean:          197.800 cm/s PV Mean grad:  1.5 mmHg AV VTI:            0.541 m AV Peak Grad:      37.1 mmHg AV Mean Grad:      19.2 mmHg LVOT Vmax:         95.23 cm/s LVOT Vmean:        65.500 cm/s LVOT VTI:          0.165 m LVOT/AV VTI ratio: 0.31  AORTA Ao Root diam: 3.90 cm Ao Asc diam:  3.60 cm MITRAL VALVE MV Area (PHT): 4.68 cm    SHUNTS MV Decel Time: 162 msec    Systemic VTI:  0.17 m MV E velocity: 66.70 cm/s  Systemic Diam: 2.40 cm MV A velocity: 77.70 cm/s MV E/A ratio:  0.86 Eleonore Chiquito MD Electronically signed by Eleonore Chiquito MD Signature Date/Time: 10/17/2020/2:18:46 PM    Final         Scheduled Meds: . aspirin EC  81 mg Oral Daily  . atorvastatin  40 mg Oral Daily  . enoxaparin (LOVENOX) injection  90 mg Subcutaneous Q24H  . insulin aspart  0-15 Units Subcutaneous TID WC  . insulin aspart  0-5 Units Subcutaneous QHS  . insulin aspart  4 Units Subcutaneous TID WC  . [START ON 10/19/2020]  insulin glargine  20 Units Subcutaneous Daily  . insulin glargine  5 Units Subcutaneous Once  . potassium chloride  40 mEq Oral Daily  . sodium chloride flush  3 mL Intravenous Q12H  . spironolactone  25 mg Oral Daily  . sulfamethoxazole-trimethoprim  1 tablet Oral Q12H  . traZODone  200 mg Oral QHS   Continuous Infusions: . sodium chloride    . cefTRIAXone (ROCEPHIN)  IV Stopped (10/17/20 1005)  . furosemide 100 mg (10/17/20 1720)     LOS: 1 day    Time spent: 35 minutes.     Elmarie Shiley, MD [facility]   If 7PM-7AM, please contact night-coverage [email or web address]  10/18/2020, 9:19 AM

## 2020-10-18 NOTE — Progress Notes (Signed)
Pt said he will wear the CPAP by himself.

## 2020-10-18 NOTE — Progress Notes (Signed)
Progress Note  Patient Name: Lawrence [name] Date of Encounter: 10/18/2020  [facility] Cardiologist: Fransico Him, MD   Subjective   Patient states he feels SOB. Feels like his UOP is lower than at home.   Blood pressures soft in 80-100s. HR 70-80s in NSR Cr 0.94 Net negative 800cc with 1775 UOP   Inpatient Medications    Scheduled Meds: . aspirin EC  81 mg Oral Daily  . atorvastatin  40 mg Oral Daily  . enoxaparin (LOVENOX) injection  90 mg Subcutaneous Q24H  . insulin aspart  0-15 Units Subcutaneous TID WC  . insulin aspart  0-5 Units Subcutaneous QHS  . insulin aspart  4 Units Subcutaneous TID WC  . [START ON 10/19/2020] insulin glargine  20 Units Subcutaneous Daily  . insulin glargine  5 Units Subcutaneous Once  . potassium chloride  40 mEq Oral Daily  . sodium chloride flush  3 mL Intravenous Q12H  . spironolactone  25 mg Oral Daily  . sulfamethoxazole-trimethoprim  1 tablet Oral Q12H  . traZODone  200 mg Oral QHS   Continuous Infusions: . sodium chloride 250 mL (10/18/20 0931)  . sodium chloride    . cefTRIAXone (ROCEPHIN)  IV 1 g (10/18/20 0940)  . furosemide 100 mg (10/17/20 1720)   PRN Meds: sodium chloride, sodium chloride, acetaminophen, oxyCODONE, sodium chloride flush   Vital Signs    Vitals:   10/18/20 0025 10/18/20 0442 10/18/20 0719 10/18/20 1149  BP: 104/75 (!) 88/53 100/75 103/70  Pulse: 85 76 78 78  Resp: 13 16 19 14   Temp: 97.8 F (36.6 C) 98.5 F (36.9 C) 98.4 F (36.9 C) 97.6 F (36.4 C)  TempSrc: Oral Oral Oral Oral  SpO2: 92% 95% 92% 94%  Weight: (!) 187.6 kg     Height:        Intake/Output Summary (Last 24 hours) at 10/18/2020 1156 Last data filed at 10/18/2020 0445 Gross per 24 hour  Intake 726.09 ml  Output 675 ml  Net 51.09 ml   Last 3 Weights 10/18/2020 10/17/2020 10/16/2020  Weight (lbs) 413 lb 9.3 oz 411 lb 2.5 oz 420 lb  Weight (kg) 187.6 kg 186.5 kg 190.511 kg      Telemetry    SR with PACs/sinus tach -  Personally Reviewed  ECG    No new tracing - Personally Reviewed  Physical Exam   GEN: Morbidly obese, comfortable Neck: JVD difficult to assess Cardiac: RRR, no murmurs, rubs, or gallops.  Respiratory: Crackles bilaterally GI: Soft, nontender, non-distended  MS: 2+ pitting edema to the thigh. Bilateral LE erythema Neuro:  Nonfocal  Psych: Normal affect   Labs    High Sensitivity Troponin:   Recent Labs  Lab 10/16/20 2343 10/17/20 0143 10/17/20 0628 10/17/20 0701  TROPONINIHS 14 18* 14 12      Chemistry Recent Labs  Lab 10/16/20 2343 10/16/20 2359 10/17/20 0347 10/17/20 0701 10/17/20 1625 10/18/20 0446  NA 132* 132*  --  130*  --  127*  K 3.3* 3.2*  --  3.3*  --  3.7  CL 91*  --   --  88*  --  87*  CO2 30  --   --  30  --  32  GLUCOSE 253*  --   --  341* 447* 286*  BUN 17  --   --  18  --  29*  CREATININE 1.08  --  1.01 1.01  --  0.94  CALCIUM 9.3  --   --  9.0  --  8.5*  PROT 7.4  --   --   --   --   --   ALBUMIN 3.7  --   --   --   --   --   AST 25  --   --   --   --   --   ALT 26  --   --   --   --   --   ALKPHOS 77  --   --   --   --   --   BILITOT 1.0  --   --   --   --   --   GFRNONAA >60  --  >60 >60  --  >60  ANIONGAP 11  --   --  12  --  8     Hematology Recent Labs  Lab 10/16/20 2343 10/16/20 2359 10/17/20 0347 10/18/20 0446  WBC 19.3*  --  25.7* 20.1*  RBC 4.86  --  4.90 4.27  HGB 13.9 14.6 14.1 12.5*  HCT 43.3 43.0 43.2 37.4*  MCV 89.1  --  88.2 87.6  MCH 28.6  --  28.8 29.3  MCHC 32.1  --  32.6 33.4  RDW 14.6  --  14.5 14.8  PLT 288  --  281 276    BNP Recent Labs  Lab 10/16/20 2343  BNP 61.4     DDimer No results for input(s): DDIMER in the last 168 hours.   Radiology    DG Chest Port 1 View  Result Date: 10/17/2020 CLINICAL DATA:  Shortness of breath EXAM: PORTABLE CHEST 1 VIEW COMPARISON:  Chest x-ray 02/04/2020, CT angio chest 09/09/2019 FINDINGS: Similar appearing cardiomegaly. The heart size and mediastinal  contours are unchanged. Streaky airspace opacity of the lung bases. Increased interstitial markings. No pleural effusion. No pneumothorax. No acute osseous abnormality. IMPRESSION: Pulmonary edema versus bronchitis. Electronically Signed   By: Iven Finn M.D.   On: 10/17/2020 00:00   ECHOCARDIOGRAM COMPLETE  Result Date: 10/17/2020    ECHOCARDIOGRAM REPORT   Patient Name:   [name] Date of Exam: 10/17/2020 Medical Rec #:  [id number]      Height:       74.0 in Accession #:    [id number]     Weight:       420.0 lb Date of Birth:  17-Jul-1956      BSA:          2.979 m Patient Age:    65 years       BP:           124/71 mmHg Patient Gender: M              HR:           120 bpm. Exam Location:  Inpatient Procedure: 2D Echo, Cardiac Doppler and Color Doppler Indications:    CHF  History:        Patient has prior history of Echocardiogram examinations, most                 recent 03/08/2019. CHF, Arrythmias:Atrial Fibrillation,                 Signs/Symptoms:Shortness of Breath; Risk Factors:Diabetes and                 Hypertension.  Sonographer:    [place] Referring Phys: [id number] [place]  1. Left ventricular ejection fraction, by estimation, is 60 to 65%. The  left ventricle has normal function. The left ventricle has no regional wall motion abnormalities. Left ventricular diastolic function could not be evaluated.  2. Right ventricular systolic function is normal. The right ventricular size is normal. Tricuspid regurgitation signal is inadequate for assessing PA pressure.  3. The mitral valve is grossly normal. No evidence of mitral valve regurgitation. No evidence of mitral stenosis.  4. The aortic valve is calcified. Aortic valve regurgitation is not visualized. Moderate aortic valve stenosis. Aortic valve area, by VTI measures 1.38 cm. Aortic valve mean gradient measures 19.2 mmHg. Aortic valve Vmax measures 3.05 m/s. Comparison(s): Changes from prior study are noted.  AFib is now present. Aortic stenosis is now moderate. FINDINGS  Left Ventricle: Left ventricular ejection fraction, by estimation, is 60 to 65%. The left ventricle has normal function. The left ventricle has no regional wall motion abnormalities. The left ventricular internal cavity size was normal in size. There is  no left ventricular hypertrophy. Left ventricular diastolic function could not be evaluated due to atrial fibrillation. Left ventricular diastolic function could not be evaluated. Right Ventricle: The right ventricular size is normal. No increase in right ventricular wall thickness. Right ventricular systolic function is normal. Tricuspid regurgitation signal is inadequate for assessing PA pressure. Left Atrium: Left atrial size was normal in size. Right Atrium: Right atrial size was normal in size. Pericardium: Trivial pericardial effusion is present. Presence of pericardial fat pad. Mitral Valve: The mitral valve is grossly normal. No evidence of mitral valve regurgitation. No evidence of mitral valve stenosis. Tricuspid Valve: The tricuspid valve is grossly normal. Tricuspid valve regurgitation is not demonstrated. No evidence of tricuspid stenosis. Aortic Valve: The aortic valve is calcified. Aortic valve regurgitation is not visualized. Moderate aortic stenosis is present. Aortic valve mean gradient measures 19.2 mmHg. Aortic valve peak gradient measures 37.1 mmHg. Aortic valve area, by VTI measures 1.38 cm. Pulmonic Valve: The pulmonic valve was grossly normal. Pulmonic valve regurgitation is not visualized. No evidence of pulmonic stenosis. Aorta: The aortic root and ascending aorta are structurally normal, with no evidence of dilitation. Venous: The inferior vena cava was not well visualized. IAS/Shunts: The atrial septum is grossly normal. EKG: Rhythm strip during this exam demostrated atrial fibrillation.  LEFT VENTRICLE PLAX 2D LVIDd:         5.30 cm     Diastology LVIDs:         2.80 cm      LV e' medial:    10.20 cm/s LV PW:         1.40 cm     LV E/e' medial:  6.5 LV IVS:        0.90 cm     LV e' lateral:   4.68 cm/s LVOT diam:     2.40 cm     LV E/e' lateral: 14.3 LV SV:         75 LV SV Index:   25 LVOT Area:     4.52 cm  LV Volumes (MOD) LV vol d, MOD A2C: 60.6 ml LV vol d, MOD A4C: 78.5 ml LV vol s, MOD A2C: 23.1 ml LV vol s, MOD A4C: 25.7 ml LV SV MOD A2C:     37.5 ml LV SV MOD A4C:     78.5 ml LV SV MOD BP:      42.3 ml RIGHT VENTRICLE RV S prime:     19.90 cm/s TAPSE (M-mode): 1.8 cm LEFT ATRIUM  Index       RIGHT ATRIUM           Index LA Vol (A2C):   25.4 ml 8.53 ml/m  RA Area:     28.30 cm LA Vol (A4C):   94.8 ml 31.82 ml/m RA Volume:   94.20 ml  31.62 ml/m LA Biplane Vol: 50.1 ml 16.82 ml/m  AORTIC VALVE                    PULMONIC VALVE AV Area (Vmax):    1.41 cm     PV Vmax:       0.67 m/s AV Area (Vmean):   1.50 cm     PV Vmean:      57.250 cm/s AV Area (VTI):     1.38 cm     PV VTI:        0.202 m AV Vmax:           304.60 cm/s  PV Peak grad:  1.8 mmHg AV Vmean:          197.800 cm/s PV Mean grad:  1.5 mmHg AV VTI:            0.541 m AV Peak Grad:      37.1 mmHg AV Mean Grad:      19.2 mmHg LVOT Vmax:         95.23 cm/s LVOT Vmean:        65.500 cm/s LVOT VTI:          0.165 m LVOT/AV VTI ratio: 0.31  AORTA Ao Root diam: 3.90 cm Ao Asc diam:  3.60 cm MITRAL VALVE MV Area (PHT): 4.68 cm    SHUNTS MV Decel Time: 162 msec    Systemic VTI:  0.17 m MV E velocity: 66.70 cm/s  Systemic Diam: 2.40 cm MV A velocity: 77.70 cm/s MV E/A ratio:  0.86 Eleonore Chiquito MD Electronically signed by Eleonore Chiquito MD Signature Date/Time: 10/17/2020/2:18:46 PM    Final     Cardiac Studies   TTE 03/08/19: IMPRESSIONS  1. The left ventricle has normal systolic function, with an ejection  fraction of 55-60%. The cavity size was normal. There is moderate  concentric left ventricular hypertrophy. Left ventricular diastolic  Doppler parameters are consistent with  pseudonormalization.  No evidence of left ventricular regional wall motion  abnormalities.  2. The right ventricle has normal systolic function. The cavity was  normal. There is no increase in right ventricular wall thickness.  3. Left atrial size was mildly dilated.  4. Right atrial size was mildly dilated.  5. Mild thickening of the mitral valve leaflet. Mild calcification of the  mitral valve leaflet. There is moderate to severe mitral annular  calcification present.  6. The aortic valve is tricuspid. Moderate thickening of the aortic  valve. Moderate calcification of the aortic valve. Mild stenosis of the  aortic valve.  7. The aorta is normal in size and structure.  8. The aortic root is normal in size and structure.   Cath 01/17/18:  There is hyperdynamic left ventricular systolic function.  LV end diastolic pressure is normal.  The left ventricular ejection fraction is greater than 65% by visual estimate.  There is no mitral valve regurgitation.  There is no aortic valve stenosis.  Prox LAD to Mid LAD lesion is 40% stenosed.  Prox Cx to Mid Cx lesion is 20% stenosed.  Ost 2nd Mrg lesion is 20% stenosed.  Angiographically minimal coronary disease with ectatic RCA  and circumflex. Normal LVEDP and normal LV function/hyperdynamic.   Plan: Return to nursing unit for post catheterization care & TR band removal. Would be okay for discharge from cardiac standpoint after bedrest post TR band removal.  Defer further plans to primary team and consulting cardiologist.  Zio 03/12/19: Max 200 bpm 09:28pm, 07/28 Min 66 bpm 08:09am, 07/28 Avg 92 bpm Multiple episodes of SVT Fastest episode at 200 BPM for 14 beats Longest episode at 15 minutes with average HR of 137 bpm Symptoms of chest pain associated with sinus rhythm Artifact makes interpretation difficult   Patient Profile     65 y.o. male with a hx of nonobstructive CAD, HTN, HLD, diastolic HF, OSA on BiPAP, morbid obesity,  depression and anxiety who presented with worsening SOB and LE edema found to have acute on chronic HF exacerbation for which Cardiology has been consulted.  Assessment & Plan    #Acute on chronic diastolic heart failure: Patient with known history of diastolic heart failure now acutely decompensated with worsening DOE, LE edema and orthopnea. Likely triggered by underlying infection. Review of monitor showed sinus tach with PACs; no definitive Afib. Initially required BiPAP now weaned to [facility]. Appears grossly overloaded on examination with NYHA class III symptoms. -Increase lasix to 120mg  IV BID -Dose metolazone 5mg  once -Hold lisinopril given soft blood pressures -Metop held due to soft blood pressures -Continue spiro 25mg  daily -Continue tele -Monitor I/Os and daily weights -Low Na diet  #Known history of SVT #Possible Afib: Monitor with possible runs of Afib however very difficult to determine if true Afib vs sinus tach with PACs and runs of SVT. Previous monitor in 2020 with SVT. Asked patient to bring in home monitor that was reportedly detecting Afib. -Review home monitor -Metop held due to soft blood pressures -Continue telemetry -If true Afib captured on tele or ECG, would merit [facility]  #Nonobstructive CAD: No evidence of ischemia and patient chest pain free. Minimal disease on cath in 2019 -Continue ASA and statin  #HTN: Soft today.  -Hold lisinopril 20mg  daily -Continue spiro 25mg  daily -Holding metop due to hypotension -Aggressive diuresis  #HLD: -Continue lipitor  #Morbid Obesity: -Consider ozempic vs wegovy as out-patient  #DMII: -Management per primary   #CKD: Cr at baseline. -Continue diuresis  #Cellulitis: #Leukocytosis: -On IV CTX per primary team      For questions or updates, please contact [place] Please consult [email or web address] for contact info under        Signed, Freada Bergeron, MD  10/18/2020, 11:56 AM

## 2020-10-18 NOTE — Progress Notes (Addendum)
CP 8/10-dull, sharp left side of chest, increaased pain with palpation.  CP 5/10, with no intervention 2019: Nitro SLx1 given  BP130/80 (87), HR 96 2024: CP 2/10 BP 101/68 (77), HR 95 Nitro SL x2 given 2029: 95/61 (73) HR 94  MD notified and spouse updated by patient.

## 2020-10-18 NOTE — Progress Notes (Signed)
Pt 02 sats drops to 89 on ra. Applies 2 Liters

## 2020-10-18 NOTE — Progress Notes (Signed)
RT contacted by unit secretary about patient requesting inhaler. Patient currently does not have any orders but does have a history of COPD. RT will continue to monitor.

## 2020-10-18 NOTE — Progress Notes (Signed)
   10/18/20 0442  Vitals  Temp 98.5 F (36.9 C)  Temp Source Oral  BP (!) 88/53  MAP (mmHg) (!) 63  BP Location Left Arm  BP Method Automatic  Patient Position (if appropriate) Lying  Pulse Rate 76  ECG Heart Rate 76  Resp 16  MEWS COLOR  MEWS Score Color Green  Oxygen Therapy  SpO2 95 %  O2 Device Nasal Cannula  O2 Flow Rate (L/min) 4 L/min  MEWS Score  MEWS Temp 0  MEWS Systolic 1  MEWS Pulse 0  MEWS RR 0  MEWS LOC 0  MEWS Score 1  Pt's BP is low, pt alert and oriented x4, asymptomatic. Dr. Cyd Silence notified, awaiting call back.

## 2020-10-18 NOTE — Progress Notes (Signed)
Patient placed on bipap connected to 3L O2

## 2020-10-18 NOTE — Progress Notes (Signed)
HOSPITAL MEDICINE OVERNIGHT EVENT NOTE    Notified by nursing that patient began to experience mid sternal chest discomfort while at rest in bed.  Nursing reports no respiratory distress and stable vital signs.  Nursing in particular reports that chest discomfort was reproducible.    Patient was administered nitroglycerin and chest discomfort eventually did resolve.  ECG obtained reveals no dynamic ST segment change, noted to be sinus rhythm.  Chart reviewed.  Cath in 2019 revealing mild nonobstructive disease.  With pain occurring at rest and worsening with palpitation with reassuring ECG I feel this is unlikely to be cardiac in etiology.   Will continue to monitor on telemetry.  I have asked nursing to notify me if it recurs.   Lawrence Emerald  MD [facility]

## 2020-10-19 DIAGNOSIS — I5032 Chronic diastolic (congestive) heart failure: Secondary | ICD-10-CM | POA: Diagnosis not present

## 2020-10-19 DIAGNOSIS — I5033 Acute on chronic diastolic (congestive) heart failure: Secondary | ICD-10-CM | POA: Diagnosis not present

## 2020-10-19 DIAGNOSIS — G4700 Insomnia, unspecified: Secondary | ICD-10-CM | POA: Diagnosis not present

## 2020-10-19 DIAGNOSIS — J449 Chronic obstructive pulmonary disease, unspecified: Secondary | ICD-10-CM | POA: Diagnosis not present

## 2020-10-19 DIAGNOSIS — I10 Essential (primary) hypertension: Secondary | ICD-10-CM | POA: Diagnosis not present

## 2020-10-19 DIAGNOSIS — E1169 Type 2 diabetes mellitus with other specified complication: Secondary | ICD-10-CM | POA: Diagnosis not present

## 2020-10-19 DIAGNOSIS — I471 Supraventricular tachycardia: Secondary | ICD-10-CM | POA: Diagnosis not present

## 2020-10-19 DIAGNOSIS — M1990 Unspecified osteoarthritis, unspecified site: Secondary | ICD-10-CM | POA: Diagnosis not present

## 2020-10-19 DIAGNOSIS — E785 Hyperlipidemia, unspecified: Secondary | ICD-10-CM | POA: Diagnosis not present

## 2020-10-19 DIAGNOSIS — E1165 Type 2 diabetes mellitus with hyperglycemia: Secondary | ICD-10-CM | POA: Diagnosis not present

## 2020-10-19 LAB — BASIC METABOLIC PANEL
Anion gap: 12 (ref 5–15)
BUN: 32 mg/dL — ABNORMAL HIGH (ref 8–23)
CO2: 31 mmol/L (ref 22–32)
Calcium: 8.8 mg/dL — ABNORMAL LOW (ref 8.9–10.3)
Chloride: 88 mmol/L — ABNORMAL LOW (ref 98–111)
Creatinine, Ser: 1.17 mg/dL (ref 0.61–1.24)
GFR, Estimated: >60 mL/min (ref >60)
Glucose, Bld: 233 mg/dL — ABNORMAL HIGH (ref 70–99)
Potassium: 3.7 mmol/L (ref 3.5–5.1)
Sodium: 131 mmol/L — ABNORMAL LOW (ref 135–145)

## 2020-10-19 LAB — CBC
HCT: 39.9 % (ref 39.0–52.0)
Hemoglobin: 13.2 g/dL (ref 13.0–17.0)
MCH: 29 pg (ref 26.0–34.0)
MCHC: 33.1 g/dL (ref 30.0–36.0)
MCV: 87.7 fL (ref 80.0–100.0)
Platelets: 293 10*3/uL (ref 150–400)
RBC: 4.55 MIL/uL (ref 4.22–5.81)
RDW: 14.9 % (ref 11.5–15.5)
WBC: 14.5 10*3/uL — ABNORMAL HIGH (ref 4.0–10.5)
nRBC: 0 % (ref 0.0–0.2)

## 2020-10-19 LAB — GLUCOSE, CAPILLARY
Glucose-Capillary: 251 mg/dL — ABNORMAL HIGH (ref 70–99)
Glucose-Capillary: 304 mg/dL — ABNORMAL HIGH (ref 70–99)
Glucose-Capillary: 261 mg/dL — ABNORMAL HIGH (ref 70–99)
Glucose-Capillary: 255 mg/dL — ABNORMAL HIGH (ref 70–99)

## 2020-10-19 LAB — MAGNESIUM: Magnesium: 2.1 mg/dL (ref 1.7–2.4)

## 2020-10-19 MED ORDER — SULFAMETHOXAZOLE-TRIMETHOPRIM 800-160 MG PO TABS
1.0000 | ORAL_TABLET | Freq: Two times a day (BID) | ORAL | Status: AC
  Administered 2020-10-19 – 2020-10-25 (×14): 1 via ORAL
  Filled 2020-10-19 (×14): qty 1

## 2020-10-19 MED ORDER — METOPROLOL SUCCINATE ER 100 MG PO TB24
100.0000 mg | ORAL_TABLET | Freq: Every day | ORAL | Status: DC
  Administered 2020-10-19: 100 mg via ORAL
  Filled 2020-10-19: qty 1

## 2020-10-19 MED ORDER — ALBUTEROL SULFATE HFA 108 (90 BASE) MCG/ACT IN AERS
1.0000 | INHALATION_SPRAY | Freq: Four times a day (QID) | RESPIRATORY_TRACT | Status: DC | PRN
  Administered 2020-10-22: 2 via RESPIRATORY_TRACT
  Filled 2020-10-19: qty 6.7

## 2020-10-19 MED ORDER — POTASSIUM CHLORIDE CRYS ER 20 MEQ PO TBCR
40.0000 meq | EXTENDED_RELEASE_TABLET | Freq: Every day | ORAL | Status: DC
  Administered 2020-10-19 – 2020-10-26 (×8): 40 meq via ORAL
  Filled 2020-10-19 (×8): qty 2

## 2020-10-19 MED ORDER — INSULIN GLARGINE 100 UNIT/ML ~~LOC~~ SOLN
25.0000 [IU] | Freq: Every day | SUBCUTANEOUS | Status: DC
  Administered 2020-10-19: 25 [IU] via SUBCUTANEOUS
  Filled 2020-10-19 (×2): qty 0.25

## 2020-10-19 MED ORDER — LIVING WELL WITH DIABETES BOOK
Freq: Once | Status: AC
  Filled 2020-10-19: qty 1

## 2020-10-19 NOTE — Progress Notes (Signed)
Inpatient Diabetes Program Recommendations  AACE/ADA: New Consensus Statement on Inpatient Glycemic Control (2015)  Target Ranges:  Prepandial:   less than 140 mg/dL      Peak postprandial:   less than 180 mg/dL (1-2 hours)      Critically ill patients:  140 - 180 mg/dL   Lab Results  Component Value Date   GLUCAP 251 (H) 10/19/2020   HGBA1C 9.2 (H) 10/17/2020    Review of Glycemic Control Results for Lawrence [name], Lawrence "RAY" (MRN [id number]) as of 10/19/2020 11:04  Ref. Range 10/18/2020 06:07 10/18/2020 11:45 10/18/2020 16:54 10/18/2020 20:45 10/19/2020 05:24  Glucose-Capillary Latest Ref Range: 70 - 99 mg/dL 267 (H) 269 (H) 253 (H) 278 (H) 251 (H)   Diabetes history: DM2 Outpatient Diabetes medications: Lantus 26 units qd + Metformin 1 gm bid + Trulicity 3 mg q week Current orders for Inpatient glycemic control: Lantus 25 units + Novolog 4 units tid meal coverage + Novolog correction 0-15 units tid + hs 0-5 units  Inpatient Diabetes Program Recommendations:   Spoke with pt about A1C results 9.2 (average blood glucose 217 over the past 2-3 months) and explained what an A1C is, basic pathophysiology of DM Type 2, basic home care, basic diabetes diet nutrition principles, importance of checking CBGs and maintaining good CBG control to prevent long-term and short-term complications. Reviewed signs and symptoms of hyperglycemia and hypoglycemia and how to treat hypoglycemia at home. Also reviewed blood sugar goals at home.  RNs to provide ongoing basic DM education at bedside with this patient. Have ordered educational booklet Living Well With Diabetes.  Thank you, Nani Gasser. Jenetta Wease, RN, MSN, CDE  Diabetes Coordinator Inpatient Glycemic Control Team Team Pager [phone] (8am-5pm) 10/19/2020 11:09 AM

## 2020-10-19 NOTE — Consult Note (Signed)
[facility] Nurse Consult Note: Patient receiving care in [place]. Primary RN in room and I provided wound care/dressing instructions to her. Reason for Consult: BLE abrasions Wound type: small abrasion of LLE pretibial area. + for Venous stasis changes BLE. No other wounds to BLE or feet. Pressure Injury POA: Yes/No/NA Measurement: na Wound bed: red Drainage (amount, consistency, odor) none Periwound: hemosiderin staining, erythematous Dressing procedure/placement/frequency: Patient states after 2 days of 2 layer compression wrap he can't stand it any longer and takes them off.  To avoid this I have ordered : Wash BLE with soap and water. Apply Sween Moisturizing Ointment (pink and white tube in clean utility). Place a small piece of Xeroform gauze Kellie Simmering [phone]) over the abrasion on the LLE. Beginning behind the toes and going to just below the knees, spiral wrap kerlex, then 4 inch ace wrap. THIS MAY TAKE MORE THAN ONE ROLL OF GAUZE AND ACE WRAP ON THE LEFT LEG. Perform daily.   The left leg is considerably larger than the right.  I have also ordered a bariatric bed with air mattress.  Thank you for the consult.  Discussed plan of care with the patient and bedside nurse.  [place] nurse will not follow at this time.  Please re-consult the [place] team if needed.  Val Riles, RN, MSN, CWOCN, CNS-BC, pager [phone]

## 2020-10-19 NOTE — Evaluation (Signed)
Physical Therapy Evaluation Patient Details Name: Lawrence [name] MRN: [id number] DOB: August 20, 1955 Today's Date: 10/19/2020   History of Present Illness  Patient is a 65 y/o male who presents on 10/17/20 with weight gain and SOB. CXR-pulmonary edema. Admitted with acute respiratory failure with hypoxia secondary to acute on chronic CHF. Also notd to have SVT vs A-fib. PMH includes diastolic CHF, COPD, CAD, DM2, PTSD/anxiey, HTN, venous insufficiency.  Clinical Impression  Patient presents with generalized weakness, pain, decreased activity tolerance, dyspnea on exertion, impaired balance and impaired mobility s/p above. Pt lives at home with his g/f and reports requiring some assist with ADLs at times and able to perform some cooking. HAs an aide come 4 days/week to assist with bathing. Pt uses rollator for ambulation in apt and electric scooter for community distances. Today, pt tolerated Min A to stand from low chair with increased effort and for short distance ambulation with use of RW for support. Limited due to weakness, fatigue and right knee pain (stepping wrong). Has support of g/f at home. Recommend OOB to chair for all meals and increasing mobility as able with nursing. SP02 dropped to mid 80s on RA with mobility but able to maintain >90% on 2L/min 02 [place]. Will follow acutely to maximize independence and mobility prior to return home.    Follow Up Recommendations Home health PT;Supervision for mobility/OOB    Equipment Recommendations  None recommended by PT    Recommendations for Other Services       Precautions / Restrictions Precautions Precautions: Fall;Other (comment) Precaution Comments: watch 02 Restrictions Weight Bearing Restrictions: No      Mobility  Bed Mobility               General bed mobility comments: UP in chair upon PT arrival.    Transfers Overall transfer level: Needs assistance Equipment used: Rolling walker (2 wheeled) Transfers: Sit to/from  Stand Sit to Stand: Min assist         General transfer comment: ASsist to power to standing with cues for hand placement and use of momentum. Increased effort. Stood from low chair x1.  Ambulation/Gait Ambulation/Gait assistance: Min assist Gait Distance (Feet): 7 Feet (x2 bouts forwards/backwards) Assistive device: Rolling walker (2 wheeled) Gait Pattern/deviations: Step-through pattern;Decreased stride length;Trunk flexed Gait velocity: decreased   General Gait Details: Slow, unsteady gait with heavy reliance on BUes. Locking out BLes during stance phase; reports stepping wrong on RLe resulting in knee pain deferring furthe distance. SP02 droppd to mid 80s on RA, donned 2L and able to maintain >90% with activity.  Stairs            Wheelchair Mobility    Modified Rankin (Stroke Patients Only)       Balance Overall balance assessment: Needs assistance Sitting-balance support: Feet supported;No upper extremity supported Sitting balance-Leahy Scale: Good     Standing balance support: During functional activity Standing balance-Leahy Scale: Poor Standing balance comment: Requires Ue support in standing.                             Pertinent Vitals/Pain Pain Assessment: Faces Faces Pain Scale: Hurts little more Pain Location: bottom Pain Descriptors / Indicators: Sore Pain Intervention(s): Monitored during session;Repositioned    Home Living Family/patient expects to be discharged to:: Private residence Living Arrangements:  (g/f) Available Help at Discharge: Family;Available 24 hours/day Type of Home: Apartment Home Access: Level entry     Home Layout: One level  Home Equipment: [place] - 4 wheels;Electric scooter;Hospital bed Additional Comments: has shower seat and bsc but they won't fit in bathroom    Prior Function Level of Independence: Needs assistance   Gait / Transfers Assistance Needed: Uses rollator to ambulate in apartment and  electric scooter for longer distances. Does some cooking. Limited household ambulator.  ADL's / Homemaking Assistance Needed: HHaide assist with bathing; 4 days/week 3 hours. M T W, F.  Comments: Does not drive.     Hand Dominance   Dominant Hand: Right    Extremity/Trunk Assessment   Upper Extremity Assessment Upper Extremity Assessment: Defer to OT evaluation    Lower Extremity Assessment Lower Extremity Assessment: Generalized weakness (Venous changes in BLes-chronic.)       Communication   Communication: No difficulties  Cognition Arousal/Alertness: Awake/alert Behavior During Therapy: WFL for tasks assessed/performed Overall Cognitive Status: Within Functional Limits for tasks assessed                                        General Comments General comments (skin integrity, edema, etc.): Some weeping and swellling distal BLes.    Exercises     Assessment/Plan    PT Assessment Patient needs continued PT services  PT Problem List Decreased strength;Decreased mobility;Pain;Decreased balance;Decreased activity tolerance;Cardiopulmonary status limiting activity;Decreased skin integrity       PT Treatment Interventions Therapeutic exercise;Gait training;Therapeutic activities;Patient/family education;Functional mobility training;Balance training    PT Goals (Current goals can be found in the Care Plan section)  Acute Rehab PT Goals Patient Stated Goal: to get better and go home PT Goal Formulation: With patient Time For Goal Achievement: 11/02/20 Potential to Achieve Goals: Good    Frequency Min 3X/week   Barriers to discharge        Co-evaluation               AM-PAC PT "6 Clicks" Mobility  Outcome Measure Help needed turning from your back to your side while in a flat bed without using bedrails?: A Little Help needed moving from lying on your back to sitting on the side of a flat bed without using bedrails?: A Little Help needed  moving to and from a bed to a chair (including a wheelchair)?: A Little Help needed standing up from a chair using your arms (e.g., wheelchair or bedside chair)?: A Little Help needed to walk in hospital room?: A Little Help needed climbing 3-5 steps with a railing? : Total 6 Click Score: 16    End of Session Equipment Utilized During Treatment: Oxygen Activity Tolerance: Patient limited by pain;Patient limited by fatigue Patient left: in chair;with call bell/phone within reach Nurse Communication: Mobility status PT Visit Diagnosis: Pain;Muscle weakness (generalized) (M62.81);Unsteadiness on feet (R26.81);Other abnormalities of gait and mobility (R26.89) Pain - part of body:  (bottom)    Time: 6283-1517 PT Time Calculation (min) (ACUTE ONLY): 18 min   Charges:   PT Evaluation $PT Eval Moderate Complexity: 1 Mod          Marisa Severin, PT, DPT Acute Rehabilitation Services Pager [phone] Office [place] 10/19/2020, 8:46 AM

## 2020-10-19 NOTE — Progress Notes (Addendum)
Progress Note  Patient Name: Lawrence [name] Date of Encounter: 10/19/2020  [facility] Cardiologist: Fransico Him, MD   Subjective   Denies any chest pain and SOB has improved with increased UOP  BP improved this am at 119/23mmHg Remains in NSR Net negative 4.8L yesterday and net neg 4.5L since admit   Inpatient Medications    Scheduled Meds: . aspirin EC  81 mg Oral Daily  . atorvastatin  40 mg Oral Daily  . enoxaparin (LOVENOX) injection  90 mg Subcutaneous Q24H  . fluticasone  2 spray Each Nare Daily  . insulin aspart  0-15 Units Subcutaneous TID WC  . insulin aspart  0-5 Units Subcutaneous QHS  . insulin aspart  4 Units Subcutaneous TID WC  . insulin glargine  25 Units Subcutaneous Daily  . metoprolol succinate  100 mg Oral Daily  . potassium chloride  40 mEq Oral Daily  . pregabalin  100 mg Oral Daily  . sodium chloride flush  3 mL Intravenous Q12H  . spironolactone  25 mg Oral Daily  . traZODone  200 mg Oral QHS   Continuous Infusions: . sodium chloride Stopped (10/18/20 1255)  . sodium chloride Stopped (10/18/20 2205)  . cefTRIAXone (ROCEPHIN)  IV Stopped (10/18/20 1012)  . furosemide 120 mg (10/19/20 0834)   PRN Meds: sodium chloride, sodium chloride, acetaminophen, albuterol, oxyCODONE, sodium chloride flush   Vital Signs    Vitals:   10/19/20 0359 10/19/20 0400 10/19/20 0800 10/19/20 0817  BP:  (!) 144/96  119/68  Pulse:  (!) 111 (!) 52 93  Resp:  (!) 24 19   Temp:  98.5 F (36.9 C)  98.6 F (37 C)  TempSrc:  Oral  Oral  SpO2: 100% 92% 93%   Weight:      Height:        Intake/Output Summary (Last 24 hours) at 10/19/2020 0934 Last data filed at 10/19/2020 7322 Gross per 24 hour  Intake 1456.03 ml  Output 4800 ml  Net -3343.97 ml   Last 3 Weights 10/19/2020 10/18/2020 10/17/2020  Weight (lbs) 404 lb 12.2 oz 413 lb 9.3 oz 411 lb 2.5 oz  Weight (kg) 183.6 kg 187.6 kg 186.5 kg      Telemetry    NSR - Personally Reviewed  ECG    No new  EKG to review - Personally Reviewed  Physical Exam   GEN: Well nourished, well developed in no acute distress HEENT: Normal NECK: No JVD; No carotid bruits LYMPHATICS: No lymphadenopathy CARDIAC:RRR, no murmurs, rubs, gallops RESPIRATORY:  Clear to auscultation without rales, wheezing or rhonchi  ABDOMEN: Soft, non-tender, non-distended MUSCULOSKELETAL:  No edema; No deformity  SKIN: Warm and dry NEUROLOGIC:  Alert and oriented x 3 PSYCHIATRIC:  Normal affect    Labs    High Sensitivity Troponin:   Recent Labs  Lab 10/16/20 2343 10/17/20 0143 10/17/20 0628 10/17/20 0701  TROPONINIHS 14 18* 14 12      Chemistry Recent Labs  Lab 10/16/20 2343 10/16/20 2359 10/17/20 0701 10/17/20 1625 10/18/20 0446 10/19/20 0333  NA 132*   < > 130*  --  127* 131*  K 3.3*   < > 3.3*  --  3.7 3.7  CL 91*  --  88*  --  87* 88*  CO2 30  --  30  --  32 31  GLUCOSE 253*  --  341* 447* 286* 233*  BUN 17  --  18  --  29* 32*  CREATININE 1.08   < >  1.01  --  0.94 1.17  CALCIUM 9.3  --  9.0  --  8.5* 8.8*  PROT 7.4  --   --   --   --   --   ALBUMIN 3.7  --   --   --   --   --   AST 25  --   --   --   --   --   ALT 26  --   --   --   --   --   ALKPHOS 77  --   --   --   --   --   BILITOT 1.0  --   --   --   --   --   GFRNONAA >60   < > >60  --  >60 >60  ANIONGAP 11  --  12  --  8 12   < > = values in this interval not displayed.     Hematology Recent Labs  Lab 10/17/20 0347 10/18/20 0446 10/19/20 0333  WBC 25.7* 20.1* 14.5*  RBC 4.90 4.27 4.55  HGB 14.1 12.5* 13.2  HCT 43.2 37.4* 39.9  MCV 88.2 87.6 87.7  MCH 28.8 29.3 29.0  MCHC 32.6 33.4 33.1  RDW 14.5 14.8 14.9  PLT 281 276 293    BNP Recent Labs  Lab 10/16/20 2343  BNP 61.4     DDimer No results for input(s): DDIMER in the last 168 hours.   Radiology    ECHOCARDIOGRAM COMPLETE  Result Date: 10/17/2020    ECHOCARDIOGRAM REPORT   Patient Name:   Lawrence [name] Date of Exam: 10/17/2020 Medical Rec #:  [id number]       Height:       74.0 in Accession #:    [id number]     Weight:       420.0 lb Date of Birth:  11-12-1955      BSA:          2.979 m Patient Age:    65 years       BP:           124/71 mmHg Patient Gender: M              HR:           120 bpm. Exam Location:  Inpatient Procedure: 2D Echo, Cardiac Doppler and Color Doppler Indications:    CHF  History:        Patient has prior history of Echocardiogram examinations, most                 recent 03/08/2019. CHF, Arrythmias:Atrial Fibrillation,                 Signs/Symptoms:Shortness of Breath; Risk Factors:Diabetes and                 Hypertension.  Sonographer:    [place] Referring Phys: [id number] [place]  1. Left ventricular ejection fraction, by estimation, is 60 to 65%. The left ventricle has normal function. The left ventricle has no regional wall motion abnormalities. Left ventricular diastolic function could not be evaluated.  2. Right ventricular systolic function is normal. The right ventricular size is normal. Tricuspid regurgitation signal is inadequate for assessing PA pressure.  3. The mitral valve is grossly normal. No evidence of mitral valve regurgitation. No evidence of mitral stenosis.  4. The aortic valve is calcified. Aortic valve regurgitation is not visualized. Moderate aortic valve stenosis. Aortic valve  area, by VTI measures 1.38 cm. Aortic valve mean gradient measures 19.2 mmHg. Aortic valve Vmax measures 3.05 m/s. Comparison(s): Changes from prior study are noted. AFib is now present. Aortic stenosis is now moderate. FINDINGS  Left Ventricle: Left ventricular ejection fraction, by estimation, is 60 to 65%. The left ventricle has normal function. The left ventricle has no regional wall motion abnormalities. The left ventricular internal cavity size was normal in size. There is  no left ventricular hypertrophy. Left ventricular diastolic function could not be evaluated due to atrial fibrillation. Left  ventricular diastolic function could not be evaluated. Right Ventricle: The right ventricular size is normal. No increase in right ventricular wall thickness. Right ventricular systolic function is normal. Tricuspid regurgitation signal is inadequate for assessing PA pressure. Left Atrium: Left atrial size was normal in size. Right Atrium: Right atrial size was normal in size. Pericardium: Trivial pericardial effusion is present. Presence of pericardial fat pad. Mitral Valve: The mitral valve is grossly normal. No evidence of mitral valve regurgitation. No evidence of mitral valve stenosis. Tricuspid Valve: The tricuspid valve is grossly normal. Tricuspid valve regurgitation is not demonstrated. No evidence of tricuspid stenosis. Aortic Valve: The aortic valve is calcified. Aortic valve regurgitation is not visualized. Moderate aortic stenosis is present. Aortic valve mean gradient measures 19.2 mmHg. Aortic valve peak gradient measures 37.1 mmHg. Aortic valve area, by VTI measures 1.38 cm. Pulmonic Valve: The pulmonic valve was grossly normal. Pulmonic valve regurgitation is not visualized. No evidence of pulmonic stenosis. Aorta: The aortic root and ascending aorta are structurally normal, with no evidence of dilitation. Venous: The inferior vena cava was not well visualized. IAS/Shunts: The atrial septum is grossly normal. EKG: Rhythm strip during this exam demostrated atrial fibrillation.  LEFT VENTRICLE PLAX 2D LVIDd:         5.30 cm     Diastology LVIDs:         2.80 cm     LV e' medial:    10.20 cm/s LV PW:         1.40 cm     LV E/e' medial:  6.5 LV IVS:        0.90 cm     LV e' lateral:   4.68 cm/s LVOT diam:     2.40 cm     LV E/e' lateral: 14.3 LV SV:         75 LV SV Index:   25 LVOT Area:     4.52 cm  LV Volumes (MOD) LV vol d, MOD A2C: 60.6 ml LV vol d, MOD A4C: 78.5 ml LV vol s, MOD A2C: 23.1 ml LV vol s, MOD A4C: 25.7 ml LV SV MOD A2C:     37.5 ml LV SV MOD A4C:     78.5 ml LV SV MOD BP:      42.3  ml RIGHT VENTRICLE RV S prime:     19.90 cm/s TAPSE (M-mode): 1.8 cm LEFT ATRIUM             Index       RIGHT ATRIUM           Index LA Vol (A2C):   25.4 ml 8.53 ml/m  RA Area:     28.30 cm LA Vol (A4C):   94.8 ml 31.82 ml/m RA Volume:   94.20 ml  31.62 ml/m LA Biplane Vol: 50.1 ml 16.82 ml/m  AORTIC VALVE  PULMONIC VALVE AV Area (Vmax):    1.41 cm     PV Vmax:       0.67 m/s AV Area (Vmean):   1.50 cm     PV Vmean:      57.250 cm/s AV Area (VTI):     1.38 cm     PV VTI:        0.202 m AV Vmax:           304.60 cm/s  PV Peak grad:  1.8 mmHg AV Vmean:          197.800 cm/s PV Mean grad:  1.5 mmHg AV VTI:            0.541 m AV Peak Grad:      37.1 mmHg AV Mean Grad:      19.2 mmHg LVOT Vmax:         95.23 cm/s LVOT Vmean:        65.500 cm/s LVOT VTI:          0.165 m LVOT/AV VTI ratio: 0.31  AORTA Ao Root diam: 3.90 cm Ao Asc diam:  3.60 cm MITRAL VALVE MV Area (PHT): 4.68 cm    SHUNTS MV Decel Time: 162 msec    Systemic VTI:  0.17 m MV E velocity: 66.70 cm/s  Systemic Diam: 2.40 cm MV A velocity: 77.70 cm/s MV E/A ratio:  0.86 Eleonore Chiquito MD Electronically signed by Eleonore Chiquito MD Signature Date/Time: 10/17/2020/2:18:46 PM    Final     Cardiac Studies   TTE 03/08/19: IMPRESSIONS  1. The left ventricle has normal systolic function, with an ejection  fraction of 55-60%. The cavity size was normal. There is moderate  concentric left ventricular hypertrophy. Left ventricular diastolic  Doppler parameters are consistent with  pseudonormalization. No evidence of left ventricular regional wall motion  abnormalities.  2. The right ventricle has normal systolic function. The cavity was  normal. There is no increase in right ventricular wall thickness.  3. Left atrial size was mildly dilated.  4. Right atrial size was mildly dilated.  5. Mild thickening of the mitral valve leaflet. Mild calcification of the  mitral valve leaflet. There is moderate to severe mitral annular   calcification present.  6. The aortic valve is tricuspid. Moderate thickening of the aortic  valve. Moderate calcification of the aortic valve. Mild stenosis of the  aortic valve.  7. The aorta is normal in size and structure.  8. The aortic root is normal in size and structure.   Cath 01/17/18:  There is hyperdynamic left ventricular systolic function.  LV end diastolic pressure is normal.  The left ventricular ejection fraction is greater than 65% by visual estimate.  There is no mitral valve regurgitation.  There is no aortic valve stenosis.  Prox LAD to Mid LAD lesion is 40% stenosed.  Prox Cx to Mid Cx lesion is 20% stenosed.  Ost 2nd Mrg lesion is 20% stenosed.  Angiographically minimal coronary disease with ectatic RCA and circumflex. Normal LVEDP and normal LV function/hyperdynamic.   Plan: Return to nursing unit for post catheterization care & TR band removal. Would be okay for discharge from cardiac standpoint after bedrest post TR band removal.  Defer further plans to primary team and consulting cardiologist.  [place] 03/12/19: Max 200 bpm 09:28pm, 07/28 Min 66 bpm 08:09am, 07/28 Avg 92 bpm Multiple episodes of SVT Fastest episode at 200 BPM for 14 beats Longest episode at 15 minutes with average HR of 137  bpm Symptoms of chest pain associated with sinus rhythm Artifact makes interpretation difficult   Patient Profile     65 y.o. male with a hx of nonobstructive CAD, HTN, HLD, diastolic HF, OSA on BiPAP, morbid obesity, depression and anxiety who presented with worsening SOB and LE edema found to have acute on chronic HF exacerbation for which Cardiology has been consulted.  Assessment & Plan    #Acute on chronic diastolic heart failure: Patient with known history of diastolic heart failure now acutely decompensated with worsening DOE, LE edema and orthopnea. Likely triggered by underlying infection. Review of monitor showed sinus tach with PACs; no  definitive Afib. Initially required BiPAP now weaned to [facility]. Appears grossly overloaded on examination with NYHA class III symptoms. -much improved UOP with increased dose of Lasix and Metolazone -he is Net negative 4.8L yesterday and net neg 4.5L since admit -BB and ACE I on hold due to soft BPs and will continue to hold to allow more BP room for diuresis -Continue spiro 25mg  daily and Lasix 120mg  IV BID -continue to monitor I/Os and daily weights -Low Na diet  #Known history of SVT #Possible Afib: Monitor with possible runs of Afib however very difficult to determine if true Afib vs sinus tach with PACs and runs of SVT. Previous monitor in 2020 with SVT. Asked patient to bring in home monitor that was reportedly detecting Afib. -Metop held due to soft blood pressures -Continue telemetry -If true Afib captured on tele or ECG, would merit [facility]  #Nonobstructive CAD: -No evidence of ischemia and patient chest pain free.  -Minimal disease on cath in 2019 -Continue ASA and statin  #HTN: BP much improved today at 119/23mmHg -continue to hold ACE I and BB for now to allow more room for diuresis -Continue spiro 25mg  daily -continue with Aggressive diuresis  #HLD: -Continue lipitor  #Morbid Obesity: -Consider ozempic vs wegovy as out-patient  #DMII: -Management per primary   #CKD: Cr bumped slightly (1.10>>0.94>>1.17) -continue to watch closely -Continue diuresis  #Cellulitis: #Leukocytosis: -On IV CTX per primary team    I have spent a total of 35 minutes with patient reviewing 2D echo , telemetry, EKGs, labs and examining patient as well as establishing an assessment and plan that was discussed with the patient.  > 50% of time was spent in direct patient care.    For questions or updates, please contact [place] Please consult [email or web address] for contact info under        Signed, Fransico Him, MD  10/19/2020, 9:34 AM

## 2020-10-19 NOTE — Progress Notes (Signed)
RT placed patient on CPAP with 2L O2 bled into circuit. Patient tolerating settings well at this time. RT will monitor as needed. 

## 2020-10-19 NOTE — Progress Notes (Signed)
PROGRESS NOTE    Lawrence [name]  WFU:[id number] DOB: May 21, 1956 DOA: 10/16/2020 PCP: Jenny Reichmann, PA-C (Inactive)   Brief Narrative: 65 year old with past medical history significant for hypertension, diabetes, hyperlipidemia, COPD, diastolic heart failure, venous stasis who presented via EMS complaining of shortness of breath.  Patient reported episode of shortness of breath for the last 2 or 3 days prior to admission worse at night.  He was evaluated by EMS and he was found to have oxygen saturation in the 70s.  He was placed on a normal breather mask and subsequently on BiPAP.  He received high-dose methylprednisolone on in route.  Evaluation in the ED chest x-ray show pulmonary edema, initial troponin was 14.  Patient admitted for heart failure exacerbation.   Assessment & Plan:   Principal Problem:   Acute on chronic diastolic CHF (congestive heart failure) (HCC) Active Problems:   Morbid obesity ([place])   Acute respiratory failure with hypoxia (HCC)   Diabetes mellitus type 2 in obese (HCC)   QT prolongation   Essential hypertension   Hypokalemia   1-Acute Hypoxic Respiratory Failure: Secondary to Acute Diastolic Heart Failure Exacerbation -Patient presented hypoxic oxygen saturation 70% initially required BiPAP and nonrebreather mask. -Currently on 2 L of oxygen. -Chest x-ray pulmonary edema. -Continue with IV Lasix 100 twice daily. -Cardiology consulted. Appreciate assistance.  -BIPAP  at bedtime. -SBP low, continue to hold ACE and BB -ECHO Ef 55% Moderate Aortic stenosis.  -Negative 1.6 L. Weight; 420--413--404 -Received a dose of metolazone 3-13.  2-Diabetes type 2 hyperglycemia: -Hold Metformin while in the hospital -Suspect  blood sugar elevated secondary to IV Solu-Medrol received by EMS -Increase lantus 25 units, Meals coverage SSI>  -Hb-A1c at 9.2  3-Hypertension: BP low, hold lisinopril, metoprolol.   4-Hypokalemia: replaced.   5-prolonged QT:  PVC: received mg, k replaced.   6-Morbid obesity: diet education.  7-LE Cellulitis; on chronic venous stasis.  Worsening redness; started Ceftriaxone, added Bactrim.  WBC trending down. Suspect leukocytosis is also elevated secondary to steroids.  Wound care consulted, appreciate assistance.  Check doppler.   8-SVT/ ?  afib;  Telemetry Cardiology consulted.  HR elevated, BP improved. Per cardiology continue to hold BB  9-COPD; Resume Inhaler.  10-Hypotension; BP low; hold lisinopril.    Estimated body mass index is 51.97 kg/m as calculated from the following:   Height as of this encounter: 6\' 2"  (1.88 m).   Weight as of this encounter: 183.6 kg.   DVT prophylaxis: Lovenox Code Status: DNR Family Communication: care discussed with patient Disposition Plan:  Status is: Inpatient  Remains inpatient appropriate because:IV treatments appropriate due to intensity of illness or inability to take PO   Dispo: The patient is from: Home              Anticipated d/c is to: Home              Patient currently is not medically stable to d/c.   Difficult to place patient No        Consultants:   Cardiology   Procedures:   ECHO  Antimicrobials:  Ceftriaxone  Subjective: He is breathing better. Had chest pain last night, on palpation. Denies chest pain.    Objective: Vitals:   10/19/20 0359 10/19/20 0400 10/19/20 0800 10/19/20 0817  BP:  (!) 144/96  119/68  Pulse:  (!) 111 (!) 52 93  Resp:  (!) 24 19   Temp:  98.5 F (36.9 C)  98.6 F (37 C)  TempSrc:  Oral  Oral  SpO2: 100% 92% 93%   Weight:      Height:        Intake/Output Summary (Last 24 hours) at 10/19/2020 0904 Last data filed at 10/19/2020 0102 Gross per 24 hour  Intake 1456.03 ml  Output 4800 ml  Net -3343.97 ml   Filed Weights   10/17/20 1545 10/18/20 0025 10/19/20 0154  Weight: (!) 186.5 kg (!) 187.6 kg (!) 183.6 kg    Examination:  General exam: NAD Respiratory system: BL crackles.   Cardiovascular system: S 1, S 2 RRR Gastrointestinal system: BS present, soft, nt Central nervous system: Alert Extremities:  Symmetric power Skin: LE with edema and redness,   Data Reviewed: I have personally reviewed following labs and imaging studies  CBC: Recent Labs  Lab 10/16/20 2343 10/16/20 2359 10/17/20 0347 10/18/20 0446 10/19/20 0333  WBC 19.3*  --  25.7* 20.1* 14.5*  NEUTROABS 17.5*  --   --   --   --   HGB 13.9 14.6 14.1 12.5* 13.2  HCT 43.3 43.0 43.2 37.4* 39.9  MCV 89.1  --  88.2 87.6 87.7  PLT 288  --  281 276 725   Basic Metabolic Panel: Recent Labs  Lab 10/16/20 2343 10/16/20 2359 10/17/20 0347 10/17/20 0701 10/17/20 1625 10/18/20 0446 10/19/20 0333  NA 132* 132*  --  130*  --  127* 131*  K 3.3* 3.2*  --  3.3*  --  3.7 3.7  CL 91*  --   --  88*  --  87* 88*  CO2 30  --   --  30  --  32 31  GLUCOSE 253*  --   --  341* 447* 286* 233*  BUN 17  --   --  18  --  29* 32*  CREATININE 1.08  --  1.01 1.01  --  0.94 1.17  CALCIUM 9.3  --   --  9.0  --  8.5* 8.8*  MG  --   --  1.8 1.8  --   --  2.1   GFR: Estimated Creatinine Clearance: 112.2 mL/min (by C-G formula based on SCr of 1.17 mg/dL). Liver Function Tests: Recent Labs  Lab 10/16/20 2343  AST 25  ALT 26  ALKPHOS 77  BILITOT 1.0  PROT 7.4  ALBUMIN 3.7   No results for input(s): LIPASE, AMYLASE in the last 168 hours. No results for input(s): AMMONIA in the last 168 hours. Coagulation Profile: No results for input(s): INR, PROTIME in the last 168 hours. Cardiac Enzymes: No results for input(s): CKTOTAL, CKMB, CKMBINDEX, TROPONINI in the last 168 hours. BNP (last 3 results) No results for input(s): PROBNP in the last 8760 hours. HbA1C: Recent Labs    10/17/20 0347  HGBA1C 9.2*   CBG: Recent Labs  Lab 10/18/20 0607 10/18/20 1145 10/18/20 1654 10/18/20 2045 10/19/20 0524  GLUCAP 267* 269* 253* 278* 251*   Lipid Profile: Recent Labs    10/17/20 0347  CHOL 116  HDL 33*   LDLCALC 58  TRIG 127  CHOLHDL 3.5   Thyroid Function Tests: No results for input(s): TSH, T4TOTAL, FREET4, T3FREE, THYROIDAB in the last 72 hours. Anemia Panel: No results for input(s): VITAMINB12, FOLATE, FERRITIN, TIBC, IRON, RETICCTPCT in the last 72 hours. Sepsis Labs: No results for input(s): PROCALCITON, LATICACIDVEN in the last 168 hours.  Recent Results (from the past 240 hour(s))  SARS CORONAVIRUS 2 (TAT 6-24 HRS) Nasopharyngeal Nasopharyngeal Swab     Status:  None   Collection Time: 10/16/20 11:42 PM   Specimen: Nasopharyngeal Swab  Result Value Ref Range Status   SARS Coronavirus 2 NEGATIVE NEGATIVE Final    Comment: (NOTE) SARS-CoV-2 target nucleic acids are NOT DETECTED.  The SARS-CoV-2 RNA is generally detectable in upper and lower respiratory specimens during the acute phase of infection. Negative results do not preclude SARS-CoV-2 infection, do not rule out co-infections with other pathogens, and should not be used as the sole basis for treatment or other patient management decisions. Negative results must be combined with clinical observations, patient history, and epidemiological information. The expected result is Negative.  Fact Sheet for Patients: SugarRoll.be  Fact Sheet for Healthcare Providers: https://www.woods-mathews.com/  This test is not yet approved or cleared by the [place] FDA and  has been authorized for detection and/or diagnosis of SARS-CoV-2 by FDA under an Emergency Use Authorization (EUA). This EUA will remain  in effect (meaning this test can be used) for the duration of the COVID-19 declaration under Se ction 564(b)(1) of the Act, 21 U.S.C. section 360bbb-3(b)(1), unless the authorization is terminated or revoked sooner.  Performed at [facility], [place] [address], [place], [place] [address]          Radiology Studies: ECHOCARDIOGRAM COMPLETE  Result Date: 10/17/2020     ECHOCARDIOGRAM REPORT   Patient Name:   RUSHAWN CAPSHAW Date of Exam: 10/17/2020 Medical Rec #:  [id number]      Height:       74.0 in Accession #:    [id number]     Weight:       420.0 lb Date of Birth:  07/27/1956      BSA:          2.979 m Patient Age:    61 years       BP:           124/71 mmHg Patient Gender: M              HR:           120 bpm. Exam Location:  Inpatient Procedure: 2D Echo, Cardiac Doppler and Color Doppler Indications:    CHF  History:        Patient has prior history of Echocardiogram examinations, most                 recent 03/08/2019. CHF, Arrythmias:Atrial Fibrillation,                 Signs/Symptoms:Shortness of Breath; Risk Factors:Diabetes and                 Hypertension.  Sonographer:    [place] Referring Phys: [id number] [place]  1. Left ventricular ejection fraction, by estimation, is 60 to 65%. The left ventricle has normal function. The left ventricle has no regional wall motion abnormalities. Left ventricular diastolic function could not be evaluated.  2. Right ventricular systolic function is normal. The right ventricular size is normal. Tricuspid regurgitation signal is inadequate for assessing PA pressure.  3. The mitral valve is grossly normal. No evidence of mitral valve regurgitation. No evidence of mitral stenosis.  4. The aortic valve is calcified. Aortic valve regurgitation is not visualized. Moderate aortic valve stenosis. Aortic valve area, by VTI measures 1.38 cm. Aortic valve mean gradient measures 19.2 mmHg. Aortic valve Vmax measures 3.05 m/s. Comparison(s): Changes from prior study are noted. AFib is now present. Aortic stenosis is now moderate. FINDINGS  Left Ventricle:  Left ventricular ejection fraction, by estimation, is 60 to 65%. The left ventricle has normal function. The left ventricle has no regional wall motion abnormalities. The left ventricular internal cavity size was normal in size. There is  no left ventricular  hypertrophy. Left ventricular diastolic function could not be evaluated due to atrial fibrillation. Left ventricular diastolic function could not be evaluated. Right Ventricle: The right ventricular size is normal. No increase in right ventricular wall thickness. Right ventricular systolic function is normal. Tricuspid regurgitation signal is inadequate for assessing PA pressure. Left Atrium: Left atrial size was normal in size. Right Atrium: Right atrial size was normal in size. Pericardium: Trivial pericardial effusion is present. Presence of pericardial fat pad. Mitral Valve: The mitral valve is grossly normal. No evidence of mitral valve regurgitation. No evidence of mitral valve stenosis. Tricuspid Valve: The tricuspid valve is grossly normal. Tricuspid valve regurgitation is not demonstrated. No evidence of tricuspid stenosis. Aortic Valve: The aortic valve is calcified. Aortic valve regurgitation is not visualized. Moderate aortic stenosis is present. Aortic valve mean gradient measures 19.2 mmHg. Aortic valve peak gradient measures 37.1 mmHg. Aortic valve area, by VTI measures 1.38 cm. Pulmonic Valve: The pulmonic valve was grossly normal. Pulmonic valve regurgitation is not visualized. No evidence of pulmonic stenosis. Aorta: The aortic root and ascending aorta are structurally normal, with no evidence of dilitation. Venous: The inferior vena cava was not well visualized. IAS/Shunts: The atrial septum is grossly normal. EKG: Rhythm strip during this exam demostrated atrial fibrillation.  LEFT VENTRICLE PLAX 2D LVIDd:         5.30 cm     Diastology LVIDs:         2.80 cm     LV e' medial:    10.20 cm/s LV PW:         1.40 cm     LV E/e' medial:  6.5 LV IVS:        0.90 cm     LV e' lateral:   4.68 cm/s LVOT diam:     2.40 cm     LV E/e' lateral: 14.3 LV SV:         75 LV SV Index:   25 LVOT Area:     4.52 cm  LV Volumes (MOD) LV vol d, MOD A2C: 60.6 ml LV vol d, MOD A4C: 78.5 ml LV vol s, MOD A2C: 23.1 ml  LV vol s, MOD A4C: 25.7 ml LV SV MOD A2C:     37.5 ml LV SV MOD A4C:     78.5 ml LV SV MOD BP:      42.3 ml RIGHT VENTRICLE RV S prime:     19.90 cm/s TAPSE (M-mode): 1.8 cm LEFT ATRIUM             Index       RIGHT ATRIUM           Index LA Vol (A2C):   25.4 ml 8.53 ml/m  RA Area:     28.30 cm LA Vol (A4C):   94.8 ml 31.82 ml/m RA Volume:   94.20 ml  31.62 ml/m LA Biplane Vol: 50.1 ml 16.82 ml/m  AORTIC VALVE                    PULMONIC VALVE AV Area (Vmax):    1.41 cm     PV Vmax:       0.67 m/s AV Area (Vmean):   1.50 cm  PV Vmean:      57.250 cm/s AV Area (VTI):     1.38 cm     PV VTI:        0.202 m AV Vmax:           304.60 cm/s  PV Peak grad:  1.8 mmHg AV Vmean:          197.800 cm/s PV Mean grad:  1.5 mmHg AV VTI:            0.541 m AV Peak Grad:      37.1 mmHg AV Mean Grad:      19.2 mmHg LVOT Vmax:         95.23 cm/s LVOT Vmean:        65.500 cm/s LVOT VTI:          0.165 m LVOT/AV VTI ratio: 0.31  AORTA Ao Root diam: 3.90 cm Ao Asc diam:  3.60 cm MITRAL VALVE MV Area (PHT): 4.68 cm    SHUNTS MV Decel Time: 162 msec    Systemic VTI:  0.17 m MV E velocity: 66.70 cm/s  Systemic Diam: 2.40 cm MV A velocity: 77.70 cm/s MV E/A ratio:  0.86 Eleonore Chiquito MD Electronically signed by Eleonore Chiquito MD Signature Date/Time: 10/17/2020/2:18:46 PM    Final         Scheduled Meds: . aspirin EC  81 mg Oral Daily  . atorvastatin  40 mg Oral Daily  . enoxaparin (LOVENOX) injection  90 mg Subcutaneous Q24H  . fluticasone  2 spray Each Nare Daily  . insulin aspart  0-15 Units Subcutaneous TID WC  . insulin aspart  0-5 Units Subcutaneous QHS  . insulin aspart  4 Units Subcutaneous TID WC  . insulin glargine  25 Units Subcutaneous Daily  . metoprolol succinate  100 mg Oral Daily  . potassium chloride  40 mEq Oral Daily  . pregabalin  100 mg Oral Daily  . sodium chloride flush  3 mL Intravenous Q12H  . spironolactone  25 mg Oral Daily  . sulfamethoxazole-trimethoprim  1 tablet Oral Q12H  .  traZODone  200 mg Oral QHS   Continuous Infusions: . sodium chloride Stopped (10/18/20 1255)  . sodium chloride Stopped (10/18/20 2205)  . cefTRIAXone (ROCEPHIN)  IV Stopped (10/18/20 1012)  . furosemide 120 mg (10/19/20 0834)     LOS: 2 days    Time spent: 35 minutes.     Elmarie Shiley, MD [facility]   If 7PM-7AM, please contact night-coverage [email or web address]  10/19/2020, 9:04 AM

## 2020-10-20 ENCOUNTER — Inpatient Hospital Stay (HOSPITAL_COMMUNITY): Payer: Medicare Other

## 2020-10-20 DIAGNOSIS — I471 Supraventricular tachycardia: Secondary | ICD-10-CM | POA: Diagnosis not present

## 2020-10-20 DIAGNOSIS — R569 Unspecified convulsions: Secondary | ICD-10-CM

## 2020-10-20 DIAGNOSIS — I10 Essential (primary) hypertension: Secondary | ICD-10-CM | POA: Diagnosis not present

## 2020-10-20 DIAGNOSIS — G40909 Epilepsy, unspecified, not intractable, without status epilepticus: Secondary | ICD-10-CM

## 2020-10-20 DIAGNOSIS — M7989 Other specified soft tissue disorders: Secondary | ICD-10-CM | POA: Diagnosis not present

## 2020-10-20 DIAGNOSIS — I5033 Acute on chronic diastolic (congestive) heart failure: Secondary | ICD-10-CM | POA: Diagnosis not present

## 2020-10-20 LAB — CBC
HCT: 39.8 % (ref 39.0–52.0)
Hemoglobin: 13.3 g/dL (ref 13.0–17.0)
MCH: 29.1 pg (ref 26.0–34.0)
MCHC: 33.4 g/dL (ref 30.0–36.0)
MCV: 87.1 fL (ref 80.0–100.0)
Platelets: 312 10*3/uL (ref 150–400)
RBC: 4.57 MIL/uL (ref 4.22–5.81)
RDW: 14.8 % (ref 11.5–15.5)
WBC: 12.5 10*3/uL — ABNORMAL HIGH (ref 4.0–10.5)
nRBC: 0 % (ref 0.0–0.2)

## 2020-10-20 LAB — BASIC METABOLIC PANEL
Anion gap: 9 (ref 5–15)
BUN: 24 mg/dL — ABNORMAL HIGH (ref 8–23)
CO2: 35 mmol/L — ABNORMAL HIGH (ref 22–32)
Calcium: 9.4 mg/dL (ref 8.9–10.3)
Chloride: 87 mmol/L — ABNORMAL LOW (ref 98–111)
Creatinine, Ser: 1.14 mg/dL (ref 0.61–1.24)
GFR, Estimated: >60 mL/min (ref >60)
Glucose, Bld: 197 mg/dL — ABNORMAL HIGH (ref 70–99)
Potassium: 4 mmol/L (ref 3.5–5.1)
Sodium: 131 mmol/L — ABNORMAL LOW (ref 135–145)

## 2020-10-20 LAB — GLUCOSE, CAPILLARY
Glucose-Capillary: 190 mg/dL — ABNORMAL HIGH (ref 70–99)
Glucose-Capillary: 293 mg/dL — ABNORMAL HIGH (ref 70–99)
Glucose-Capillary: 273 mg/dL — ABNORMAL HIGH (ref 70–99)
Glucose-Capillary: 287 mg/dL — ABNORMAL HIGH (ref 70–99)

## 2020-10-20 MED ORDER — ARIPIPRAZOLE 5 MG PO TABS
5.0000 mg | ORAL_TABLET | Freq: Every day | ORAL | Status: DC
Start: 2020-10-20 — End: 2020-10-26
  Administered 2020-10-20 – 2020-10-25 (×6): 5 mg via ORAL
  Filled 2020-10-20 (×6): qty 1

## 2020-10-20 MED ORDER — TORSEMIDE 20 MG PO TABS
60.0000 mg | ORAL_TABLET | Freq: Two times a day (BID) | ORAL | Status: DC
  Administered 2020-10-20 – 2020-10-22 (×4): 60 mg via ORAL
  Filled 2020-10-20 (×4): qty 3

## 2020-10-20 MED ORDER — LORAZEPAM 2 MG/ML IJ SOLN
1.0000 mg | INTRAMUSCULAR | Status: DC | PRN
  Filled 2020-10-20: qty 1

## 2020-10-20 MED ORDER — TEMAZEPAM 15 MG PO CAPS
15.0000 mg | ORAL_CAPSULE | Freq: Every day | ORAL | Status: DC
  Administered 2020-10-20 – 2020-10-25 (×6): 15 mg via ORAL
  Filled 2020-10-20 (×6): qty 1

## 2020-10-20 MED ORDER — LEVETIRACETAM IN NACL 1000 MG/100ML IV SOLN
1000.0000 mg | Freq: Two times a day (BID) | INTRAVENOUS | Status: DC
  Administered 2020-10-21 – 2020-10-26 (×10): 1000 mg via INTRAVENOUS
  Filled 2020-10-20 (×13): qty 100

## 2020-10-20 MED ORDER — VENLAFAXINE HCL ER 75 MG PO CP24
225.0000 mg | ORAL_CAPSULE | Freq: Every day | ORAL | Status: DC
  Administered 2020-10-20 – 2020-10-26 (×7): 225 mg via ORAL
  Filled 2020-10-20 (×7): qty 3

## 2020-10-20 MED ORDER — INSULIN GLARGINE 100 UNIT/ML ~~LOC~~ SOLN
26.0000 [IU] | Freq: Every day | SUBCUTANEOUS | Status: DC
  Administered 2020-10-20 – 2020-10-21 (×2): 26 [IU] via SUBCUTANEOUS
  Filled 2020-10-20 (×2): qty 0.26

## 2020-10-20 MED ORDER — PREGABALIN 100 MG PO CAPS
100.0000 mg | ORAL_CAPSULE | Freq: Three times a day (TID) | ORAL | Status: DC
  Administered 2020-10-20 – 2020-10-26 (×19): 100 mg via ORAL
  Filled 2020-10-20 (×19): qty 1

## 2020-10-20 MED ORDER — SODIUM CHLORIDE 0.9 % IV SOLN
3000.0000 mg | Freq: Once | INTRAVENOUS | Status: AC
  Administered 2020-10-20: 3000 mg via INTRAVENOUS
  Filled 2020-10-20: qty 30

## 2020-10-20 MED ORDER — LORAZEPAM 1 MG PO TABS
1.0000 mg | ORAL_TABLET | Freq: Every day | ORAL | Status: DC | PRN
  Administered 2020-10-20: 1 mg via ORAL
  Filled 2020-10-20: qty 1

## 2020-10-20 MED ORDER — VENLAFAXINE HCL 75 MG PO TABS: 225.0000 mg | ORAL_TABLET | Freq: Every day | ORAL | Status: DC

## 2020-10-20 NOTE — Progress Notes (Signed)
Lower extremity venous bilateral study completed.   Please see CV Proc for preliminary results.   Hayato Guaman, RDMS, RVT  

## 2020-10-20 NOTE — Consult Note (Signed)
NEUROLOGY CONSULTATION NOTE   Date of service: October 20, 2020 Patient Name: Lawrence [name] MRN:  [id number] DOB:  02-15-1956 Reason for consult: shaking spells with alteration of consciousness _ _ _   _ __   _ __ _ _  __ __   _ __   __ _  History of Present Illness   This is a 66 year old gentleman with past medical history significant for hypertension, diabetes, hyperlipidemia, COPD, diastolic heart failure, venous stasis, peripheral neuropathy who presented on October 16, 2020 to emergency department after being brought in by EMS for shortness of breath with oxygen saturation in the 70s.  He was placed on a nonrebreather mask and then on BiPAP.  He was found to have pulmonary edema and was admitted for heart failure exacerbation.  He has been diuresed over the last several days and he is doing better from a cardiology standpoint.  But she is consulted due to 2 events of rhythmic shaking of his upper extremities with alteration of consciousness.  First event occurred day of admission 3 hours prior to EMS being called.  Per wife he became less responsive and started shaking all of his extremities.  He had an episode of urinary incontinence during that event and was lethargic and confused afterwards.  The second event occurred this afternoon and was actually captured by his wife on video and viewed by me.  He began to have rhythmic jerking of both extremities (the beginning of the spell was not captured on video and I am not clear if it started on one side or not).  He again had decreased responsiveness.  He did not have any incontinence associated with this event.  He was lethargic and confused afterwards.  He did receive Ativan after the event terminated on its own.  He was lethargic on my exam but fully oriented with no focal neurologic deficits.  No personal history of seizures.  He is on pregabalin 100 mg 3 times daily as an outpatient for neuropathic pain.  He has been receiving 100 mg daily since  admission.  He is also on temazepam 15 mg nightly at home which has now been ordered and will start tonight.  He has never had an EEG.  He has not had head imaging this admission.  He also does have a lower extremity cellulitis and is on ceftriaxone for that.    ROS   10 point review of systems was performed and is negative except as noted in the HPI.  Past History   Past Medical History:  Diagnosis Date  . Acute on chronic diastolic CHF (congestive heart failure) ([place]) 01/13/2018  . Acute respiratory failure with hypoxia ([place]) 01/13/2018  . Adenomatous colon polyp 2007  . Anasarca 01/31/2017  . Anemia   . Anxiety   . Arthritis   . Asthma   . Bilateral lower extremity edema 01/31/2017  . Bilateral lower leg cellulitis   . BPH without obstruction/lower urinary tract symptoms 02/22/2017  . Cellulitis 11/07/2017  . Cellulitis of lower extremity   . Chest pain   . Chronic cough   . Chronic diastolic CHF (congestive heart failure) ([place])   . Chronic venous stasis 03/07/2019  . COPD (chronic obstructive pulmonary disease) ([place])   . Coronary artery calcification seen on CAT scan   . Depression   . Diabetic neuropathy ([place]) 09/11/2019  . Hematuria 02/22/2017  . History of anemia 12/26/2018  . History of cardiac cath    a.  01/2018 with angiographically  minimal coronary disease with  up to 40% prox LAD and 20% mid LCx and OM2 with normal LVF and normal LVEDP..  . History of colon polyps 08/24/2018  . Hypertension   . Leukocytosis 08/18/2018  . Morbid obesity ([place])   . New onset type 2 diabetes mellitus ([place]) 11/08/2017  . OSA (obstructive sleep apnea)   . Pain due to onychomycosis of toenails of both feet 09/11/2019  . Peripheral neuropathy 02/22/2017  . Physical deconditioning 02/22/2017  . Primary osteoarthritis, left shoulder 03/05/2017  . PTSD (post-traumatic stress disorder)   . Pure hypercholesterolemia   . QT prolongation 03/07/2019  . Right foot pain 01/13/2018  . Shortness of breath   .  Sinus tachycardia 03/07/2019  . Sleep apnea    CPAP  . Type 2 diabetes mellitus with vascular disease ([place]) 09/11/2019   Past Surgical History:  Procedure Laterality Date  . ENDOVENOUS ABLATION SAPHENOUS VEIN W/ LASER Right 08/20/2020   endovenous laser ablation right greater saphenous vein by Gae Gallop MD   . [place]     left knee replacement x 2  . KNEE ARTHROSCOPY Bilateral   . LEFT HEART CATH AND CORONARY ANGIOGRAPHY N/A 01/17/2018   Procedure: LEFT HEART CATH AND CORONARY ANGIOGRAPHY;  Surgeon: Leonie Man, MD;  Location: [facility];  Service: Cardiovascular;  Laterality: N/A;  . UMBILICAL HERNIA REPAIR     Family History  Problem Relation Age of Onset  . CAD Maternal Grandfather   . Diabetes Other   . Diabetes Mellitus II Neg Hx   . Colon cancer Neg Hx   . Esophageal cancer Neg Hx   . Inflammatory bowel disease Neg Hx   . Liver disease Neg Hx   . Pancreatic cancer Neg Hx   . Rectal cancer Neg Hx   . Stomach cancer Neg Hx    Social History   Socioeconomic History  . Marital status: Single    Spouse name: Not on file  . Number of children: 1  . Years of education: Not on file  . Highest education level: Not on file  Occupational History  . Occupation: retired  Tobacco Use  . Smoking status: Former Smoker    Quit date: 03/07/2016    Years since quitting: 4.6  . Smokeless tobacco: Never Used  Substance and Sexual Activity  . Alcohol use: Not Currently    Comment: rare  . Drug use: No  . Sexual activity: Not on file  Other Topics Concern  . Not on file  Social History Narrative  . Not on file   Social Determinants of Health   Financial Resource Strain: Not on file  Food Insecurity: Not on file  Transportation Needs: Not on file  Physical Activity: Not on file  Stress: Not on file  Social Connections: Not on file   Allergies  Allergen Reactions  . Vancomycin Other (See Comments)    "Red Man Syndrome" 02/02/17: possible cause for  rash under both arms  . Niacin And Related Other (See Comments)    Red man syndrome  . Tubersol [Tuberculin] Other (See Comments)    Reaction unknown  . Doxycycline Rash    Medications   Medications Prior to Admission  Medication Sig Dispense Refill Last Dose  . acetaminophen (TYLENOL) 650 MG CR tablet Take 1,300 mg by mouth every 8 (eight) hours as needed for pain.   10/16/2020 at Unknown time  . albuterol (VENTOLIN HFA) 108 (90 Base) MCG/ACT inhaler Inhale into the lungs.  10/16/2020 at Unknown time  . allopurinol (ZYLOPRIM) 100 MG tablet Take 100 mg by mouth 2 (two) times daily.   10/16/2020 at Unknown time  . ARIPiprazole (ABILIFY) 5 MG tablet Take 5 mg by mouth at bedtime.    10/16/2020 at Unknown time  . atorvastatin (LIPITOR) 40 MG tablet TAKE ONE TABLET BY MOUTH DAILY AT 6PM 90 tablet 2 10/16/2020 at Unknown time  . cetirizine (ZYRTEC) 10 MG tablet Take 10 mg by mouth at bedtime.    10/16/2020 at Unknown time  . Dulaglutide (TRULICITY) 3 BJ/4.7WG SOPN Inject 3 mg into the skin once a week.   10/12/2020  . ferrous sulfate 324 (65 Fe) MG TBEC Take 324 mg by mouth daily with breakfast.    10/16/2020 at Unknown time  . fluticasone (FLONASE) 50 MCG/ACT nasal spray Place 2 sprays into both nostrils daily.    10/16/2020 at Unknown time  . furosemide (LASIX) 80 MG tablet Take 2 tablets (160 mg total) every morning and 1 tablet (80 mg total) every evening. 270 tablet 3 10/16/2020 at Unknown time  . LANTUS SOLOSTAR 100 UNIT/ML Solostar Pen Inject 3 Units into the skin daily.   10/16/2020 at Unknown time  . loperamide (IMODIUM) 2 MG capsule Take 2 mg by mouth 2 (two) times a day.   10/16/2020 at Unknown time  . metFORMIN (GLUCOPHAGE) 1000 MG tablet Take 1,000 mg by mouth 2 (two) times daily with a meal.   10/16/2020 at Unknown time  . metoprolol succinate (TOPROL-XL) 100 MG 24 hr tablet Take 1 tablet (100 mg total) by mouth in the morning and at bedtime. Take with or immediately following a meal. 90 tablet  3 10/16/2020 at 0800  . omeprazole (PRILOSEC) 20 MG capsule Take 20 mg by mouth every morning.   10/16/2020 at Unknown time  . oxyCODONE (OXY IR/ROXICODONE) 5 MG immediate release tablet Take 5 mg by mouth every 8 (eight) hours as needed.   10/17/2020 at Unknown time  . prazosin (MINIPRESS) 2 MG capsule Take 2 mg by mouth 2 (two) times daily.   10/16/2020 at Unknown time  . pregabalin (LYRICA) 100 MG capsule Take 100 mg by mouth in the morning, at noon, and at bedtime.    10/16/2020 at Unknown time  . propranolol (INDERAL) 10 MG tablet TAKE ONE TABLET BY MOUTH AS NEEDED 90 tablet 2 Past Week at 2100  . silver sulfADIAZINE (SILVADENE) 1 % cream Apply 1 application topically See admin instructions. Apply as directed once a day as directed to bilateral legs   10/16/2020 at Unknown time  . spironolactone (ALDACTONE) 25 MG tablet Take 1 tablet (25 mg total) by mouth daily. 10 tablet 0 10/16/2020 at Unknown time  . temazepam (RESTORIL) 15 MG capsule Take 15 mg by mouth at bedtime.    10/16/2020 at Unknown time  . traZODone (DESYREL) 100 MG tablet Take 200 mg by mouth at bedtime.   10/16/2020 at Unknown time  . Vitamin D, Ergocalciferol, (DRISDOL) 1.25 MG (50000 UNIT) CAPS capsule Take 50,000 Units by mouth every 7 (seven) days.   10/16/2020 at Unknown time  . blood glucose meter kit and supplies Dispense based on patient and insurance preference. Use up to four times daily as directed. (FOR ICD-10 E10.9, E11.9). 1 each 0   . lisinopril (ZESTRIL) 20 MG tablet Take 1 tablet (20 mg total) by mouth daily. (Patient not taking: Reported on 10/17/2020) 90 tablet 3 Not Taking at Unknown time  . LORazepam (ATIVAN) 1 MG tablet  Take 2 tablets 30 minutes prior to leaving house on day of procedure and bring third tablet with you to office. (Patient not taking: Reported on 10/17/2020) 3 tablet 0 Not Taking at Unknown time     Vitals   Vitals:   10/19/20 2335 10/20/20 0010 10/20/20 0527 10/20/20 0715  BP:  129/76  102/65   Pulse: 93 96    Resp: $Remo'18 16  20  'zfISF$ Temp:  98.3 F (36.8 C)  97.9 F (36.6 C)  TempSrc:  Oral  Oral  SpO2: (!) 88% 94%  91%  Weight:   (!) 175.2 kg   Height:         Body mass index is 49.6 kg/m.  Physical Exam   General: sleeping, snoring, but awakens to voice. Mildly lethargic. CV: RRR Pulmonary: Symmetric Chest rise. Normal respiratory effort. Skin: Bilateral venous stasis to below the knee   Neurologic Examination  Mental status/Cognition: Awakens to voice, mildly lethargic, oriented to self, place, month and year. Speech/language: Fluent, comprehension intact, object naming intact, repetition intact.  Cranial nerves:   CN II Pupils equal and reactive to light, no VF deficits    CN III,IV,VI EOM intact, no gaze preference or deviation, no nystagmus    CN V normal sensation in V1, V2, and V3 segments bilaterally    CN VII no asymmetry, no nasolabial fold flattening    CN VIII normal hearing to speech    CN IX & X normal palatal elevation, no uvular deviation    CN XI 5/5 head turn and 5/5 shoulder shrug bilaterally    CN XII midline tongue protrusion    Motor:  Normal bulk and tone, no rigidity, bradykinesia, or tremor. Strength 5/5 BUE, anti-gravity BLE without gross focality, formal BLE strength exam limited 2/2 pain and infection.   Reflexes: 2+ throughout and symmetric, 1+ bilat patellae, bilat achilles deferred 2/2 neuropathic pain  Sensation: intact to PP, LT throughout  Coordination: FNF intact  Gait: deferred  Labs   CBC:  Recent Labs  Lab 10/16/20 2343 10/16/20 2359 10/19/20 0333 10/20/20 0344  WBC 19.3*   < > 14.5* 12.5*  NEUTROABS 17.5*  --   --   --   HGB 13.9   < > 13.2 13.3  HCT 43.3   < > 39.9 39.8  MCV 89.1   < > 87.7 87.1  PLT 288   < > 293 312   < > = values in this interval not displayed.    Basic Metabolic Panel:  Lab Results  Component Value Date   NA 131 (L) 10/20/2020   K 4.0 10/20/2020   CO2 35 (H) 10/20/2020   GLUCOSE  197 (H) 10/20/2020   BUN 24 (H) 10/20/2020   CREATININE 1.14 10/20/2020   CALCIUM 9.4 10/20/2020   GFRNONAA >60 10/20/2020   GFRAA 77 05/20/2020   Lipid Panel:  Lab Results  Component Value Date   LDLCALC 58 10/17/2020   HgbA1c:  Lab Results  Component Value Date   HGBA1C 9.2 (H) 10/17/2020   Urine Drug Screen:     Component Value Date/Time   LABOPIA POSITIVE (A) 08/18/2018 0405   COCAINSCRNUR NONE DETECTED 08/18/2018 0405   LABBENZ POSITIVE (A) 08/18/2018 0405   AMPHETMU NONE DETECTED 08/18/2018 0405   THCU NONE DETECTED 08/18/2018 0405   LABBARB NONE DETECTED 08/18/2018 0405    Alcohol Level No results found for: [place]    Impression   65 yo man w/ no previous seizure history p/w 2  witnessed events in 4 days concerning for partial seizure with impaired awareness  Recommendations   - Keppra load 3g f/b $Remo'1000mg'UOunp$  bid - Increase pregabalin to $RemoveBefor'100mg'NfyjgGCVqzLI$  tid (home dose) - Continue home temazepam $RemoveBefore'15mg'MUBDSHOAFHTYo$  qhs - MRI brain with and without contrast  - Spot EEG - Will continue to follow ______________________________________________________________________   Thank you for the opportunity to take part in the care of this patient. If you have any further questions, please contact the neurology consultation attending.  Su Monks, MD [facility] [phone]  If 7pm- 7am, please page neurology on call as listed in [place].

## 2020-10-20 NOTE — Progress Notes (Signed)
Physical Therapy Treatment Patient Details Name: Lawrence [name] MRN: [id number] DOB: 11-10-1955 Today's Date: 10/20/2020    History of Present Illness Patient is a 65 y/o male who presents on 10/17/20 with weight gain and SOB. CXR-pulmonary edema. Admitted with acute respiratory failure with hypoxia secondary to acute on chronic CHF. Also notd to have SVT vs A-fib. PMH includes diastolic CHF, COPD, CAD, DM2, PTSD/anxiey, HTN, venous insufficiency.    PT Comments    Patient progressing well towards PT goals. Reports continued right knee pain but improved from yesterday. Tolerated short distance ambulation with Min guard-Min assist and use of RW for support. Fatigues needing seated rest break between bouts. Continues to require Mod A to stand from all surfaces due to weakness. Sp02 remained in 90s on 2L/min 02 [place] during activity. Encouraged OOB to chair for all meals. Will continue to follow and progress and attempt to wean 02 prior to d.c.   Follow Up Recommendations  Home health PT;Supervision for mobility/OOB     Equipment Recommendations  None recommended by PT    Recommendations for Other Services       Precautions / Restrictions Precautions Precautions: Fall;Other (comment) Precaution Comments: watch 02 Restrictions Weight Bearing Restrictions: No    Mobility  Bed Mobility Overal bed mobility: Needs Assistance Bed Mobility: Supine to Sit     Supine to sit: Supervision;HOB elevated     General bed mobility comments: Increased time and heavy use of rail to get to EOB.    Transfers Overall transfer level: Needs assistance Equipment used: Rolling walker (2 wheeled) Transfers: Sit to/from Stand Sit to Stand: Mod assist         General transfer comment: ASsist to power to standing with cues for hand placement and use of momentum. Increased effort/difficulty, unable on first attempt from EOB. Stood from [employer], from chair x2. Transferred to chair post  ambulation.  Ambulation/Gait Ambulation/Gait assistance: Min assist Gait Distance (Feet): 20 Feet (+ 10' +10') Assistive device: Rolling walker (2 wheeled) Gait Pattern/deviations: Step-through pattern;Decreased stride length;Trunk flexed;Wide base of support Gait velocity: decreased   General Gait Details: Slow mildly unsteady gait with heavy reliance on UEs. 2/4 DOE. Sp02 remained in 90s on 2L/min 02 [place]. 1 seated rest break. First bout walking forwards/backwards. fatigues.   Stairs             Wheelchair Mobility    Modified Rankin (Stroke Patients Only)       Balance Overall balance assessment: Needs assistance Sitting-balance support: Feet supported;Single extremity supported Sitting balance-Leahy Scale: Good     Standing balance support: During functional activity Standing balance-Leahy Scale: Poor Standing balance comment: Requires Ue support in standing.                            Cognition Arousal/Alertness: Awake/alert Behavior During Therapy: WFL for tasks assessed/performed Overall Cognitive Status: Within Functional Limits for tasks assessed                                        Exercises      General Comments General comments (skin integrity, edema, etc.): pt's partner present during session. Continued swelling in BLEs.      Pertinent Vitals/Pain Pain Assessment: Faces Faces Pain Scale: Hurts little more Pain Location: right knee Pain Descriptors / Indicators: Sore Pain Intervention(s): Monitored during session;Repositioned  Home Living                      Prior Function            PT Goals (current goals can now be found in the care plan section) Progress towards PT goals: Progressing toward goals    Frequency    Min 3X/week      PT Plan Current plan remains appropriate    Co-evaluation              AM-PAC PT "6 Clicks" Mobility   Outcome Measure  Help needed turning from  your back to your side while in a flat bed without using bedrails?: A Little Help needed moving from lying on your back to sitting on the side of a flat bed without using bedrails?: A Little Help needed moving to and from a bed to a chair (including a wheelchair)?: A Little Help needed standing up from a chair using your arms (e.g., wheelchair or bedside chair)?: A Lot Help needed to walk in hospital room?: A Little Help needed climbing 3-5 steps with a railing? : A Lot 6 Click Score: 16    End of Session Equipment Utilized During Treatment: Oxygen Activity Tolerance: Patient tolerated treatment well;Patient limited by fatigue Patient left: in chair;with call bell/phone within reach;with family/visitor present Nurse Communication: Mobility status PT Visit Diagnosis: Pain;Muscle weakness (generalized) (M62.81);Unsteadiness on feet (R26.81);Other abnormalities of gait and mobility (R26.89) Pain - Right/Left: Right Pain - part of body: Knee     Time: 5883-2549 PT Time Calculation (min) (ACUTE ONLY): 24 min  Charges:  $Gait Training: 8-22 mins $Therapeutic Activity: 8-22 mins                     Marisa Severin, PT, DPT Acute Rehabilitation Services Pager [phone] Office [place] 10/20/2020, 12:39 PM

## 2020-10-20 NOTE — Progress Notes (Addendum)
EEG complete - results pending.  Study to stay to capture activity mention in notes.

## 2020-10-20 NOTE — Plan of Care (Signed)
  Problem: Activity: Goal: Risk for activity intolerance will decrease Outcome: Progressing   Problem: Pain Managment: Goal: General experience of comfort will improve Outcome: Progressing   

## 2020-10-20 NOTE — Progress Notes (Signed)
Patient was having tremor jocking only on R arm v/s stable denies pain at the time also patient became emotional and started crying. MD notified. See epic for test and intervention. Will continue to monitor the patient.

## 2020-10-20 NOTE — Progress Notes (Signed)
Delayed entry : Patient removed all leads shortly after tech left room.  EEG machine retrieved.

## 2020-10-20 NOTE — Progress Notes (Addendum)
Progress Note  Patient Name: Lawrence [name] Date of Encounter: 10/20/2020  [facility] Cardiologist: Fransico Him, MD   Subjective   Feeling better with no CP or SOB Remains in NSR Net negative 6.63L yesterday and net neg 9L since admit Weight down at least 25lbs from admit   Inpatient Medications    Scheduled Meds: . aspirin EC  81 mg Oral Daily  . atorvastatin  40 mg Oral Daily  . enoxaparin (LOVENOX) injection  90 mg Subcutaneous Q24H  . fluticasone  2 spray Each Nare Daily  . insulin aspart  0-15 Units Subcutaneous TID WC  . insulin aspart  0-5 Units Subcutaneous QHS  . insulin aspart  4 Units Subcutaneous TID WC  . insulin glargine  26 Units Subcutaneous Daily  . potassium chloride  40 mEq Oral Daily  . pregabalin  100 mg Oral Daily  . sodium chloride flush  3 mL Intravenous Q12H  . spironolactone  25 mg Oral Daily  . sulfamethoxazole-trimethoprim  1 tablet Oral Q12H  . traZODone  200 mg Oral QHS   Continuous Infusions: . sodium chloride Stopped (10/18/20 1255)  . sodium chloride Stopped (10/19/20 2055)  . cefTRIAXone (ROCEPHIN)  IV Stopped (10/19/20 1048)  . furosemide 120 mg (10/20/20 0749)   PRN Meds: sodium chloride, sodium chloride, acetaminophen, albuterol, oxyCODONE, sodium chloride flush   Vital Signs    Vitals:   10/19/20 2335 10/20/20 0010 10/20/20 0527 10/20/20 0715  BP:  129/76  102/65  Pulse: 93 96    Resp: 18 16  20   Temp:  98.3 F (36.8 C)  97.9 F (36.6 C)  TempSrc:  Oral  Oral  SpO2: (!) 88% 94%  91%  Weight:   (!) 175.2 kg   Height:        Intake/Output Summary (Last 24 hours) at 10/20/2020 1610 Last data filed at 10/20/2020 0749 Gross per 24 hour  Intake 2081.25 ml  Output 6375 ml  Net -4293.75 ml   Last 3 Weights 10/20/2020 10/19/2020 10/18/2020  Weight (lbs) 386 lb 4.8 oz 404 lb 12.2 oz 413 lb 9.3 oz  Weight (kg) 175.225 kg 183.6 kg 187.6 kg      Telemetry    NSR with frequent PACs and episodes of atrial tachycardia -  Personally Reviewed  ECG    No new EKG to review - Personally Reviewed  Physical Exam   GEN: Morbidly obese, well developed in no acute distress HEENT: Normal NECK: JVD difficult to assess due to body habitus LYMPHATICS: No lymphadenopathy CARDIAC:RRR, no murmurs, rubs, gallops RESPIRATORY:  Clear to auscultation without rales, wheezing or rhonchi  ABDOMEN: Soft, non-tender, non-distended MUSCULOSKELETAL:  1+ LE edema with chronic venous stasis changes and mild erythema; No deformity  SKIN: Warm and dry NEUROLOGIC:  Alert and oriented x 3 PSYCHIATRIC:  Normal affect    Labs    High Sensitivity Troponin:   Recent Labs  Lab 10/16/20 2343 10/17/20 0143 10/17/20 0628 10/17/20 0701  TROPONINIHS 14 18* 14 12      Chemistry Recent Labs  Lab 10/16/20 2343 10/16/20 2359 10/18/20 0446 10/19/20 0333 10/20/20 0344  NA 132*   < > 127* 131* 131*  K 3.3*   < > 3.7 3.7 4.0  CL 91*   < > 87* 88* 87*  CO2 30   < > 32 31 35*  GLUCOSE 253*   < > 286* 233* 197*  BUN 17   < > 29* 32* 24*  CREATININE 1.08   < >  0.94 1.17 1.14  CALCIUM 9.3   < > 8.5* 8.8* 9.4  PROT 7.4  --   --   --   --   ALBUMIN 3.7  --   --   --   --   AST 25  --   --   --   --   ALT 26  --   --   --   --   ALKPHOS 77  --   --   --   --   BILITOT 1.0  --   --   --   --   GFRNONAA >60   < > >60 >60 >60  ANIONGAP 11   < > 8 12 9    < > = values in this interval not displayed.     Hematology Recent Labs  Lab 10/18/20 0446 10/19/20 0333 10/20/20 0344  WBC 20.1* 14.5* 12.5*  RBC 4.27 4.55 4.57  HGB 12.5* 13.2 13.3  HCT 37.4* 39.9 39.8  MCV 87.6 87.7 87.1  MCH 29.3 29.0 29.1  MCHC 33.4 33.1 33.4  RDW 14.8 14.9 14.8  PLT 276 293 312    BNP Recent Labs  Lab 10/16/20 2343  BNP 61.4     DDimer No results for input(s): DDIMER in the last 168 hours.   Radiology    No results found.  Cardiac Studies   TTE 03/08/19: IMPRESSIONS  1. The left ventricle has normal systolic function, with an  ejection  fraction of 55-60%. The cavity size was normal. There is moderate  concentric left ventricular hypertrophy. Left ventricular diastolic  Doppler parameters are consistent with  pseudonormalization. No evidence of left ventricular regional wall motion  abnormalities.  2. The right ventricle has normal systolic function. The cavity was  normal. There is no increase in right ventricular wall thickness.  3. Left atrial size was mildly dilated.  4. Right atrial size was mildly dilated.  5. Mild thickening of the mitral valve leaflet. Mild calcification of the  mitral valve leaflet. There is moderate to severe mitral annular  calcification present.  6. The aortic valve is tricuspid. Moderate thickening of the aortic  valve. Moderate calcification of the aortic valve. Mild stenosis of the  aortic valve.  7. The aorta is normal in size and structure.  8. The aortic root is normal in size and structure.   Cath 01/17/18:  There is hyperdynamic left ventricular systolic function.  LV end diastolic pressure is normal.  The left ventricular ejection fraction is greater than 65% by visual estimate.  There is no mitral valve regurgitation.  There is no aortic valve stenosis.  Prox LAD to Mid LAD lesion is 40% stenosed.  Prox Cx to Mid Cx lesion is 20% stenosed.  Ost 2nd Mrg lesion is 20% stenosed.  Angiographically minimal coronary disease with ectatic RCA and circumflex. Normal LVEDP and normal LV function/hyperdynamic.   Plan: Return to nursing unit for post catheterization care & TR band removal. Would be okay for discharge from cardiac standpoint after bedrest post TR band removal.  Defer further plans to primary team and consulting cardiologist.  Zio 03/12/19: Max 200 bpm 09:28pm, 07/28 Min 66 bpm 08:09am, 07/28 Avg 92 bpm Multiple episodes of SVT Fastest episode at 200 BPM for 14 beats Longest episode at 15 minutes with average HR of 137 bpm Symptoms of  chest pain associated with sinus rhythm Artifact makes interpretation difficult   Patient Profile     65 y.o. male with a hx of  nonobstructive CAD, HTN, HLD, diastolic HF, OSA on BiPAP, morbid obesity, depression and anxiety who presented with worsening SOB and LE edema found to have acute on chronic HF exacerbation for which Cardiology has been consulted.  Assessment & Plan    #Acute on chronic diastolic heart failure: Patient with known history of diastolic heart failure now acutely decompensated with worsening DOE, LE edema and orthopnea. Likely triggered by underlying infection. Review of monitor showed sinus tach with PACs; no definitive Afib. Initially required BiPAP now weaned to [facility]. Appears grossly overloaded on examination with NYHA class III symptoms. -much improved UOP with increased dose of Lasix and Metolazone -he is Net negative 9L yesterday since admit and weight is down 25lbs from admit -BB and ACE I on hold due to soft BPs and will continue to hold to allow more BP room for diuresis -SCr stable at 1.14 but CO2 now trending upward to 35 -SBP soft this am and HR increased -I think we are nearing euvolemia>>difficult to assess given his morbid obesity -he was on Lasix at home but ? If he is absorbing well -will change Lasix to Demadex 60mg  BID PO -follow I&O's, daily weights and renal function -Continue spiro 25mg  daily  -Low Na diet  #Known history of SVT #Possible Afib: Monitor with possible runs of Afib however very difficult to determine if true Afib vs sinus tach with PACs and runs of SVT. Previous monitor in 2020 with SVT. Asked patient to bring in home monitor that was reportedly detecting Afib. -currently monitor shows runs of nonsustained atrial tachycardia with occasional PACs and PVCs -Metop held due to soft blood pressures -Continue telemetry -If true Afib captured on tele or ECG, would merit [facility]  #Nonobstructive CAD: -No evidence of ischemia and patient  chest pain free.  -Minimal disease on cath in 2019 -Continue ASA and statin  #HTN: -BP stable but on soft side today at 102/71mmHg -continue to hold ACE I and BB for now -Continue spiro 25mg  daily  #HLD: -Continue lipitor -LDL 58 this admit  #Morbid Obesity: -Consider ozempic vs wegovy as out-patient  #DMII: -Management per primary   #CKD: Cr bumped slightly (1.10>>0.94>>1.17) -continue to watch closely -Continue diuresis  #Cellulitis: #Leukocytosis: -On IV CTX per primary team    I have spent a total of 35 minutes with patient reviewing 2D echo , telemetry, EKGs, labs and examining patient as well as establishing an assessment and plan that was discussed with the patient.  > 50% of time was spent in direct patient care.    For questions or updates, please contact [facility] Please consult [email or web address] for contact info under        Signed, Fransico Him, MD  10/20/2020, 9:39 AM

## 2020-10-20 NOTE — Progress Notes (Addendum)
PROGRESS NOTE    Lawrence [name]  MLY:[id number] DOB: March 28, 1956 DOA: 10/16/2020 PCP: Jenny Reichmann, PA-C (Inactive)   Brief Narrative: 65 year old with past medical history significant for hypertension, diabetes, hyperlipidemia, COPD, diastolic heart failure, venous stasis who presented via EMS complaining of shortness of breath.  Patient reported episode of shortness of breath for the last 2 or 3 days prior to admission worse at night.  He was evaluated by EMS and he was found to have oxygen saturation in the 70s.  He was placed on a normal breather mask and subsequently on BiPAP.  He received high-dose methylprednisolone on in route.  Evaluation in the ED chest x-ray show pulmonary edema, initial troponin was 14.  Patient admitted for heart failure exacerbation.   Assessment & Plan:   Principal Problem:   Acute on chronic diastolic CHF (congestive heart failure) (HCC) Active Problems:   Morbid obesity ([place])   Acute respiratory failure with hypoxia (HCC)   Diabetes mellitus type 2 in obese (HCC)   QT prolongation   Essential hypertension   Hypokalemia   1-Acute Hypoxic Respiratory Failure: Secondary to Acute Diastolic Heart Failure Exacerbation -Patient presented hypoxic oxygen saturation 70% initially required BiPAP and nonrebreather mask. -Currently on 2 L of oxygen. -Chest x-ray pulmonary edema. -Treated  with IV Lasix 100 twice daily, plan to transition to torsemide 60 mg BID>  -Cardiology consulted. Appreciate assistance.  -BIPAP  at bedtime. -SBP was  low, continue to hold ACE and BB -ECHO Ef 55% Moderate Aortic stenosis.  -Negative 1.6 L. Weight; 420--413--404---386 -Received a dose of metolazone 3-13. -Improved.   2-Partial Seizure -Patient develops right arm tremor/jerking movement. He is alert, answer questions. Per wife patient had this tremors on admission, that was why she brought him here on Friday, he had episode of urinary incontinence as well. He hasn't  had tremors since admission.  -He has been off Effexor, since admission, but per wife he was having this tremors at home and he was taking his effexor.  -I have order Ativan PRN.  -Neurology has been consulted.  Discussed with neurology, patient likely had seizure. Plan to load him with keppra. Increase lyrica home dose. MRI and EEG.   3-Diabetes type 2 hyperglycemia: -Hold Metformin while in the hospital -Suspect  blood sugar elevated secondary to IV Solu-Medrol received by EMS -Increase lantus 26 units, Meals coverage SSI>  -Hb-A1c at 9.2  -Hypertension: BP low, hold lisinopril, metoprolol.   -Hypokalemia: replaced.   -prolonged QT: PVC: received mg, k replaced.   -Morbid obesity: diet education.  -LE Cellulitis; on chronic venous stasis.  Patient presents with worsening redness; started Ceftriaxone, and Bactrim.  WBC trending down. Suspect leukocytosis is also elevated secondary to steroids.  Wound care consulted, appreciate assistance.  Doppler: negative for DVT>  Less redness.   -SVT/ ;[facility] Telemetry Cardiology consulted.  HR elevated, BP improved. Per cardiology continue to hold BB  -COPD; Resume Inhaler.  -Hypotension; BP low; hold lisinopril.  Depression;  Resume effexor, abilify/ Temazepam.  Continue with lyrica.    Estimated body mass index is 49.6 kg/m as calculated from the following:   Height as of this encounter: 6\' 2"  (1.88 m).   Weight as of this encounter: 175.2 kg.   DVT prophylaxis: Lovenox Code Status: DNR Family Communication: care discussed with patient and significant other at bedside.  Disposition Plan:  Status is: Inpatient  Remains inpatient appropriate because:IV treatments appropriate due to intensity of illness or inability to take PO   Dispo:  The patient is from: Home              Anticipated d/c is to: Home              Patient currently is not medically stable to d/c.   Difficult to place patient No        Consultants:    Cardiology   Procedures:   ECHO  Antimicrobials:  Ceftriaxone  Subjective: I saw patient this morning, he was doing well, breathing better, denies chest pain. LE redness improved.   I came in the afternoon to see him for new onset tremors. I was informed by nurse patient has new tremors.  Per wife patient had similar episode the day of admission at home, accompanied by urinary incontinence. Patient has been off Effexor, was not on his medications list. I have resume Effexor.   Objective: Vitals:   10/19/20 2335 10/20/20 0010 10/20/20 0527 10/20/20 0715  BP:  129/76  102/65  Pulse: 93 96    Resp: 18 16  20   Temp:  98.3 F (36.8 C)  97.9 F (36.6 C)  TempSrc:  Oral  Oral  SpO2: (!) 88% 94%  91%  Weight:   (!) 175.2 kg   Height:        Intake/Output Summary (Last 24 hours) at 10/20/2020 1357 Last data filed at 10/20/2020 1300 Gross per 24 hour  Intake 1664.25 ml  Output 7050 ml  Net -5385.75 ml   Filed Weights   10/18/20 0025 10/19/20 0154 10/20/20 0527  Weight: (!) 187.6 kg (!) 183.6 kg (!) 175.2 kg    Examination:  General exam: NAD Respiratory system: BL crackles.  Cardiovascular system: S 1, S 2 RRR Gastrointestinal system: BS present, soft, nt Central nervous system: Alert, at rest and on movement tremors of right arm.  Extremities:  Plus 1 edema, improved Skin: LE with less redness, and redness is not extending to thigh   Data Reviewed: I have personally reviewed following labs and imaging studies  CBC: Recent Labs  Lab 10/16/20 2343 10/16/20 2359 10/17/20 0347 10/18/20 0446 10/19/20 0333 10/20/20 0344  WBC 19.3*  --  25.7* 20.1* 14.5* 12.5*  NEUTROABS 17.5*  --   --   --   --   --   HGB 13.9 14.6 14.1 12.5* 13.2 13.3  HCT 43.3 43.0 43.2 37.4* 39.9 39.8  MCV 89.1  --  88.2 87.6 87.7 87.1  PLT 288  --  281 276 293 993   Basic Metabolic Panel: Recent Labs  Lab 10/16/20 2343 10/16/20 2359 10/17/20 0347 10/17/20 0701 10/17/20 1625  10/18/20 0446 10/19/20 0333 10/20/20 0344  NA 132* 132*  --  130*  --  127* 131* 131*  K 3.3* 3.2*  --  3.3*  --  3.7 3.7 4.0  CL 91*  --   --  88*  --  87* 88* 87*  CO2 30  --   --  30  --  32 31 35*  GLUCOSE 253*  --   --  341* 447* 286* 233* 197*  BUN 17  --   --  18  --  29* 32* 24*  CREATININE 1.08  --  1.01 1.01  --  0.94 1.17 1.14  CALCIUM 9.3  --   --  9.0  --  8.5* 8.8* 9.4  MG  --   --  1.8 1.8  --   --  2.1  --    GFR: Estimated Creatinine Clearance: 112  mL/min (by C-G formula based on SCr of 1.14 mg/dL). Liver Function Tests: Recent Labs  Lab 10/16/20 2343  AST 25  ALT 26  ALKPHOS 77  BILITOT 1.0  PROT 7.4  ALBUMIN 3.7   No results for input(s): LIPASE, AMYLASE in the last 168 hours. No results for input(s): AMMONIA in the last 168 hours. Coagulation Profile: No results for input(s): INR, PROTIME in the last 168 hours. Cardiac Enzymes: No results for input(s): CKTOTAL, CKMB, CKMBINDEX, TROPONINI in the last 168 hours. BNP (last 3 results) No results for input(s): PROBNP in the last 8760 hours. HbA1C: No results for input(s): HGBA1C in the last 72 hours. CBG: Recent Labs  Lab 10/19/20 1145 10/19/20 1622 10/19/20 2055 10/20/20 0603 10/20/20 1144  GLUCAP 304* 261* 255* 190* 293*   Lipid Profile: No results for input(s): CHOL, HDL, LDLCALC, TRIG, CHOLHDL, LDLDIRECT in the last 72 hours. Thyroid Function Tests: No results for input(s): TSH, T4TOTAL, FREET4, T3FREE, THYROIDAB in the last 72 hours. Anemia Panel: No results for input(s): VITAMINB12, FOLATE, FERRITIN, TIBC, IRON, RETICCTPCT in the last 72 hours. Sepsis Labs: No results for input(s): PROCALCITON, LATICACIDVEN in the last 168 hours.  Recent Results (from the past 240 hour(s))  SARS CORONAVIRUS 2 (TAT 6-24 HRS) Nasopharyngeal Nasopharyngeal Swab     Status: None   Collection Time: 10/16/20 11:42 PM   Specimen: Nasopharyngeal Swab  Result Value Ref Range Status   SARS Coronavirus 2 NEGATIVE  NEGATIVE Final    Comment: (NOTE) SARS-CoV-2 target nucleic acids are NOT DETECTED.  The SARS-CoV-2 RNA is generally detectable in upper and lower respiratory specimens during the acute phase of infection. Negative results do not preclude SARS-CoV-2 infection, do not rule out co-infections with other pathogens, and should not be used as the sole basis for treatment or other patient management decisions. Negative results must be combined with clinical observations, patient history, and epidemiological information. The expected result is Negative.  Fact Sheet for Patients: SugarRoll.be  Fact Sheet for Healthcare Providers: https://www.woods-mathews.com/  This test is not yet approved or cleared by the [place] FDA and  has been authorized for detection and/or diagnosis of SARS-CoV-2 by FDA under an Emergency Use Authorization (EUA). This EUA will remain  in effect (meaning this test can be used) for the duration of the COVID-19 declaration under Se ction 564(b)(1) of the Act, 21 U.S.C. section 360bbb-3(b)(1), unless the authorization is terminated or revoked sooner.  Performed at [name], [place] [address] [place], [place] [address]          Radiology Studies: VAS [place] LOWER EXTREMITY VENOUS (DVT)  Result Date: 10/20/2020  Lower Venous DVT Study Indications: Swelling. Other Indications: S/P RT GSV ablation from proximal thigh to calf. Comparison Study: 12-05-2019 Lower extermity venous RT study was negative for                   DVT. Performing Technologist: Darlin Coco RDMS,RVT  Examination Guidelines: A complete evaluation includes B-mode imaging, spectral Doppler, color Doppler, and power Doppler as needed of all accessible portions of each vessel. Bilateral testing is considered an integral part of a complete examination. Limited examinations for reoccurring indications may be performed as noted. The reflux portion of the  exam is performed with the patient in reverse Trendelenburg.  +---------+---------------+---------+-----------+----------+-------------------+ RIGHT    CompressibilityPhasicitySpontaneityPropertiesThrombus Aging      +---------+---------------+---------+-----------+----------+-------------------+ CFV      Full           Yes  Yes                                      +---------+---------------+---------+-----------+----------+-------------------+ SFJ      Full                                                             +---------+---------------+---------+-----------+----------+-------------------+ FV Prox  Full                                                             +---------+---------------+---------+-----------+----------+-------------------+ FV Mid   Full                                                             +---------+---------------+---------+-----------+----------+-------------------+ FV DistalFull                                                             +---------+---------------+---------+-----------+----------+-------------------+ PFV      Full                                                             +---------+---------------+---------+-----------+----------+-------------------+ POP      Full           Yes      Yes                                      +---------+---------------+---------+-----------+----------+-------------------+ PTV                     Yes      Yes                                      +---------+---------------+---------+-----------+----------+-------------------+ PERO                                                  Not well visualized +---------+---------------+---------+-----------+----------+-------------------+   +---------+---------------+---------+-----------+----------+-------------------+ LEFT     CompressibilityPhasicitySpontaneityPropertiesThrombus Aging       +---------+---------------+---------+-----------+----------+-------------------+ CFV      Full           Yes      Yes                                      +---------+---------------+---------+-----------+----------+-------------------+  SFJ      Full                                                             +---------+---------------+---------+-----------+----------+-------------------+ FV Prox  Full                                                             +---------+---------------+---------+-----------+----------+-------------------+ FV Mid   Full                                                             +---------+---------------+---------+-----------+----------+-------------------+ FV DistalFull                                                             +---------+---------------+---------+-----------+----------+-------------------+ PFV      Full                                                             +---------+---------------+---------+-----------+----------+-------------------+ POP      Full           Yes      Yes                                      +---------+---------------+---------+-----------+----------+-------------------+ PTV                     Yes      Yes                                      +---------+---------------+---------+-----------+----------+-------------------+ PERO                                                  Not well visualized +---------+---------------+---------+-----------+----------+-------------------+     Summary: RIGHT: - There is no evidence of deep vein thrombosis in the lower extremity. However, portions of this examination were limited- see technologist comments above.  - No cystic structure found in the popliteal fossa.  LEFT: - There is no evidence of deep vein thrombosis in the lower extremity. However, portions of this examination were limited- see technologist comments above.  - No cystic  structure found in the popliteal fossa.  *See table(s) above for measurements and observations.  Preliminary         Scheduled Meds: . ARIPiprazole  5 mg Oral QHS  . aspirin EC  81 mg Oral Daily  . atorvastatin  40 mg Oral Daily  . enoxaparin (LOVENOX) injection  90 mg Subcutaneous Q24H  . fluticasone  2 spray Each Nare Daily  . insulin aspart  0-15 Units Subcutaneous TID WC  . insulin aspart  0-5 Units Subcutaneous QHS  . insulin aspart  4 Units Subcutaneous TID WC  . insulin glargine  26 Units Subcutaneous Daily  . potassium chloride  40 mEq Oral Daily  . pregabalin  100 mg Oral Daily  . sodium chloride flush  3 mL Intravenous Q12H  . spironolactone  25 mg Oral Daily  . sulfamethoxazole-trimethoprim  1 tablet Oral Q12H  . temazepam  15 mg Oral QHS  . torsemide  60 mg Oral BID  . traZODone  200 mg Oral QHS  . venlafaxine XR  225 mg Oral Daily   Continuous Infusions: . sodium chloride Stopped (10/18/20 1255)  . sodium chloride Stopped (10/19/20 2055)  . cefTRIAXone (ROCEPHIN)  IV 1 g (10/20/20 1137)     LOS: 3 days    Time spent: 35 minutes.     Elmarie Shiley, MD [facility]   If 7PM-7AM, please contact night-coverage [email or web address]  10/20/2020, 1:57 PM

## 2020-10-21 DIAGNOSIS — R569 Unspecified convulsions: Secondary | ICD-10-CM

## 2020-10-21 DIAGNOSIS — I471 Supraventricular tachycardia: Secondary | ICD-10-CM | POA: Diagnosis not present

## 2020-10-21 DIAGNOSIS — I10 Essential (primary) hypertension: Secondary | ICD-10-CM | POA: Diagnosis not present

## 2020-10-21 DIAGNOSIS — I5033 Acute on chronic diastolic (congestive) heart failure: Secondary | ICD-10-CM

## 2020-10-21 LAB — CBC
HCT: 42.7 % (ref 39.0–52.0)
Hemoglobin: 14.4 g/dL (ref 13.0–17.0)
MCH: 29 pg (ref 26.0–34.0)
MCHC: 33.7 g/dL (ref 30.0–36.0)
MCV: 86.1 fL (ref 80.0–100.0)
Platelets: 351 10*3/uL (ref 150–400)
RBC: 4.96 MIL/uL (ref 4.22–5.81)
RDW: 14.6 % (ref 11.5–15.5)
WBC: 15.4 10*3/uL — ABNORMAL HIGH (ref 4.0–10.5)
nRBC: 0 % (ref 0.0–0.2)

## 2020-10-21 LAB — BASIC METABOLIC PANEL
Anion gap: 12 (ref 5–15)
BUN: 22 mg/dL (ref 8–23)
CO2: 27 mmol/L (ref 22–32)
Calcium: 9.2 mg/dL (ref 8.9–10.3)
Chloride: 90 mmol/L — ABNORMAL LOW (ref 98–111)
Creatinine, Ser: 1.01 mg/dL (ref 0.61–1.24)
GFR, Estimated: >60 mL/min (ref >60)
Glucose, Bld: 234 mg/dL — ABNORMAL HIGH (ref 70–99)
Potassium: 4.3 mmol/L (ref 3.5–5.1)
Sodium: 129 mmol/L — ABNORMAL LOW (ref 135–145)

## 2020-10-21 LAB — GLUCOSE, CAPILLARY
Glucose-Capillary: 243 mg/dL — ABNORMAL HIGH (ref 70–99)
Glucose-Capillary: 354 mg/dL — ABNORMAL HIGH (ref 70–99)
Glucose-Capillary: 280 mg/dL — ABNORMAL HIGH (ref 70–99)
Glucose-Capillary: 323 mg/dL — ABNORMAL HIGH (ref 70–99)

## 2020-10-21 LAB — MAGNESIUM: Magnesium: 2.1 mg/dL (ref 1.7–2.4)

## 2020-10-21 MED ORDER — INSULIN ASPART 100 UNIT/ML ~~LOC~~ SOLN
8.0000 [IU] | Freq: Three times a day (TID) | SUBCUTANEOUS | Status: DC
  Administered 2020-10-22 (×2): 8 [IU] via SUBCUTANEOUS

## 2020-10-21 MED ORDER — LORAZEPAM 2 MG/ML IJ SOLN
0.5000 mg | Freq: Once | INTRAMUSCULAR | Status: AC | PRN
  Administered 2020-10-22: 0.5 mg via INTRAVENOUS

## 2020-10-21 MED ORDER — INSULIN GLARGINE 100 UNIT/ML ~~LOC~~ SOLN
30.0000 [IU] | Freq: Every day | SUBCUTANEOUS | Status: DC
  Administered 2020-10-22: 30 [IU] via SUBCUTANEOUS
  Filled 2020-10-21: qty 0.3

## 2020-10-21 MED ORDER — METOPROLOL SUCCINATE ER 25 MG PO TB24
25.0000 mg | ORAL_TABLET | Freq: Every day | ORAL | Status: DC
  Administered 2020-10-21 – 2020-10-26 (×6): 25 mg via ORAL
  Filled 2020-10-21 (×6): qty 1

## 2020-10-21 NOTE — Progress Notes (Addendum)
Progress Note  Patient Name: Lawrence [name] Date of Encounter: 10/21/2020  [facility] Cardiologist: Fransico Him, MD   Subjective   Denies any chest pain and SOB improved Had a seizure last night and neuro on board Tele shows NSR with no arrhythmias around the time of the seizure He continues to diurese well with 4.6L out yesterday and net neg 12.5L Weight down at least 25lbs from admit   Inpatient Medications    Scheduled Meds: . ARIPiprazole  5 mg Oral QHS  . aspirin EC  81 mg Oral Daily  . atorvastatin  40 mg Oral Daily  . enoxaparin (LOVENOX) injection  90 mg Subcutaneous Q24H  . fluticasone  2 spray Each Nare Daily  . insulin aspart  0-15 Units Subcutaneous TID WC  . insulin aspart  0-5 Units Subcutaneous QHS  . insulin aspart  4 Units Subcutaneous TID WC  . insulin glargine  26 Units Subcutaneous Daily  . potassium chloride  40 mEq Oral Daily  . pregabalin  100 mg Oral TID  . sodium chloride flush  3 mL Intravenous Q12H  . spironolactone  25 mg Oral Daily  . sulfamethoxazole-trimethoprim  1 tablet Oral Q12H  . temazepam  15 mg Oral QHS  . torsemide  60 mg Oral BID  . traZODone  200 mg Oral QHS  . venlafaxine XR  225 mg Oral Daily   Continuous Infusions: . sodium chloride Stopped (10/18/20 1255)  . sodium chloride Stopped (10/19/20 2055)  . cefTRIAXone (ROCEPHIN)  IV 1 g (10/21/20 0829)  . levETIRAcetam 1,000 mg (10/21/20 0624)   PRN Meds: sodium chloride, sodium chloride, acetaminophen, albuterol, LORazepam, oxyCODONE, sodium chloride flush   Vital Signs    Vitals:   10/21/20 0139 10/21/20 0416 10/21/20 0725 10/21/20 0800  BP:  126/85    Pulse:  79  (!) 102  Resp:  18  (!) 23  Temp:  97.6 F (36.4 C) (!) 97.5 F (36.4 C)   TempSrc:  Oral Oral   SpO2:  90%  90%  Weight: 97.5 kg     Height:        Intake/Output Summary (Last 24 hours) at 10/21/2020 0834 Last data filed at 10/21/2020 0746 Gross per 24 hour  Intake 1081.24 ml  Output 4551 ml   Net -3469.76 ml   Last 3 Weights 10/21/2020 10/20/2020 10/19/2020  Weight (lbs) 215 lb 386 lb 4.8 oz 404 lb 12.2 oz  Weight (kg) 97.523 kg 175.225 kg 183.6 kg      Telemetry  NSR with frequent PACs- Personally Reviewed  ECG    No new EKG to review - Personally Reviewed  Physical Exam   GEN: Well nourished, well developed in no acute distress HEENT: Normal NECK: No JVD; No carotid bruits LYMPHATICS: No lymphadenopathy CARDIAC:RRR, no murmurs, rubs, gallops RESPIRATORY:  Clear to auscultation without rales, wheezing or rhonchi  ABDOMEN: Soft, non-tender, non-distended MUSCULOSKELETAL:  No edema; No deformity  SKIN: Warm and dry NEUROLOGIC:  Alert and oriented x 3 PSYCHIATRIC:  Normal affect   Labs    High Sensitivity Troponin:   Recent Labs  Lab 10/16/20 2343 10/17/20 0143 10/17/20 0628 10/17/20 0701  TROPONINIHS 14 18* 14 12      Chemistry Recent Labs  Lab 10/16/20 2343 10/16/20 2359 10/19/20 0333 10/20/20 0344 10/21/20 0433  NA 132*   < > 131* 131* 129*  K 3.3*   < > 3.7 4.0 4.3  CL 91*   < > 88* 87* 90*  CO2 30   < > 31 35* 27  GLUCOSE 253*   < > 233* 197* 234*  BUN 17   < > 32* 24* 22  CREATININE 1.08   < > 1.17 1.14 1.01  CALCIUM 9.3   < > 8.8* 9.4 9.2  PROT 7.4  --   --   --   --   ALBUMIN 3.7  --   --   --   --   AST 25  --   --   --   --   ALT 26  --   --   --   --   ALKPHOS 77  --   --   --   --   BILITOT 1.0  --   --   --   --   GFRNONAA >60   < > >60 >60 >60  ANIONGAP 11   < > 12 9 12    < > = values in this interval not displayed.     Hematology Recent Labs  Lab 10/19/20 0333 10/20/20 0344 10/21/20 0433  WBC 14.5* 12.5* 15.4*  RBC 4.55 4.57 4.96  HGB 13.2 13.3 14.4  HCT 39.9 39.8 42.7  MCV 87.7 87.1 86.1  MCH 29.0 29.1 29.0  MCHC 33.1 33.4 33.7  RDW 14.9 14.8 14.6  PLT 293 312 351    BNP Recent Labs  Lab 10/16/20 2343  BNP 61.4     DDimer No results for input(s): DDIMER in the last 168 hours.   Radiology    MR  BRAIN WO CONTRAST  Result Date: 10/21/2020 CLINICAL DATA:  Initial evaluation for acute seizure. EXAM: MRI HEAD WITHOUT CONTRAST TECHNIQUE: Multiplanar, multiecho pulse sequences of the brain and surrounding structures were obtained without intravenous contrast. COMPARISON:  None. FINDINGS: Brain: Examination severely limited by extensive motion artifact and patient's inability to tolerate the full length of the study. Cerebral volume within normal limits for age. No definite focal parenchymal signal abnormality. No abnormal foci of restricted diffusion to suggest acute or subacute ischemia. Gray-white matter differentiation maintained. No encephalomalacia to suggest chronic cortical infarction. No definite evidence for acute or chronic intracranial hemorrhage. No visible mass lesion, mass effect or midline shift. No hydrocephalus or extra-axial fluid collection. Pituitary gland suprasellar region grossly normal. Midline structures intact. Vascular: Major intracranial vascular flow voids are maintained. Skull and upper cervical spine: Craniocervical junction grossly within normal limits. Bone marrow signal intensity grossly normal. No visible scalp soft tissue abnormality. Sinuses/Orbits: Globes and orbital soft tissues within normal limits. Paranasal sinuses are largely clear. No significant mastoid effusion. Other: None. IMPRESSION: 1. Technically limited exam due to extensive motion artifact and patient's inability to tolerate the full length of the study. 2. Grossly negative brain MRI. No acute intracranial abnormality identified. Electronically Signed   By: Jeannine Boga M.D.   On: 10/21/2020 02:20   VAS [place] LOWER EXTREMITY VENOUS (DVT)  Result Date: 10/20/2020  Lower Venous DVT Study Indications: Swelling. Other Indications: S/P RT GSV ablation from proximal thigh to calf. Comparison Study: 12-05-2019 Lower extermity venous RT study was negative for                   DVT. Performing Technologist:  Darlin Coco RDMS,RVT  Examination Guidelines: A complete evaluation includes B-mode imaging, spectral Doppler, color Doppler, and power Doppler as needed of all accessible portions of each vessel. Bilateral testing is considered an integral part of a complete examination. Limited examinations for reoccurring indications may be performed  as noted. The reflux portion of the exam is performed with the patient in reverse Trendelenburg.  +---------+---------------+---------+-----------+----------+-------------------+ RIGHT    CompressibilityPhasicitySpontaneityPropertiesThrombus Aging      +---------+---------------+---------+-----------+----------+-------------------+ CFV      Full           Yes      Yes                                      +---------+---------------+---------+-----------+----------+-------------------+ SFJ      Full                                                             +---------+---------------+---------+-----------+----------+-------------------+ FV Prox  Full                                                             +---------+---------------+---------+-----------+----------+-------------------+ FV Mid   Full                                                             +---------+---------------+---------+-----------+----------+-------------------+ FV DistalFull                                                             +---------+---------------+---------+-----------+----------+-------------------+ PFV      Full                                                             +---------+---------------+---------+-----------+----------+-------------------+ POP      Full           Yes      Yes                                      +---------+---------------+---------+-----------+----------+-------------------+ PTV                     Yes      Yes                                       +---------+---------------+---------+-----------+----------+-------------------+ PERO  Not well visualized +---------+---------------+---------+-----------+----------+-------------------+   +---------+---------------+---------+-----------+----------+-------------------+ LEFT     CompressibilityPhasicitySpontaneityPropertiesThrombus Aging      +---------+---------------+---------+-----------+----------+-------------------+ CFV      Full           Yes      Yes                                      +---------+---------------+---------+-----------+----------+-------------------+ SFJ      Full                                                             +---------+---------------+---------+-----------+----------+-------------------+ FV Prox  Full                                                             +---------+---------------+---------+-----------+----------+-------------------+ FV Mid   Full                                                             +---------+---------------+---------+-----------+----------+-------------------+ FV DistalFull                                                             +---------+---------------+---------+-----------+----------+-------------------+ PFV      Full                                                             +---------+---------------+---------+-----------+----------+-------------------+ POP      Full           Yes      Yes                                      +---------+---------------+---------+-----------+----------+-------------------+ PTV                     Yes      Yes                                      +---------+---------------+---------+-----------+----------+-------------------+ PERO                                                  Not well visualized +---------+---------------+---------+-----------+----------+-------------------+      Summary:  RIGHT: - There is no evidence of deep vein thrombosis in the lower extremity. However, portions of this examination were limited- see technologist comments above.  - No cystic structure found in the popliteal fossa.  LEFT: - There is no evidence of deep vein thrombosis in the lower extremity. However, portions of this examination were limited- see technologist comments above.  - No cystic structure found in the popliteal fossa.  *See table(s) above for measurements and observations. Electronically signed by [name] MD on 10/20/2020 at 3:47:37 PM.    Final     Cardiac Studies   TTE 03/08/19: IMPRESSIONS  1. The left ventricle has normal systolic function, with an ejection  fraction of 55-60%. The cavity size was normal. There is moderate  concentric left ventricular hypertrophy. Left ventricular diastolic  Doppler parameters are consistent with  pseudonormalization. No evidence of left ventricular regional wall motion  abnormalities.  2. The right ventricle has normal systolic function. The cavity was  normal. There is no increase in right ventricular wall thickness.  3. Left atrial size was mildly dilated.  4. Right atrial size was mildly dilated.  5. Mild thickening of the mitral valve leaflet. Mild calcification of the  mitral valve leaflet. There is moderate to severe mitral annular  calcification present.  6. The aortic valve is tricuspid. Moderate thickening of the aortic  valve. Moderate calcification of the aortic valve. Mild stenosis of the  aortic valve.  7. The aorta is normal in size and structure.  8. The aortic root is normal in size and structure.   Cath 01/17/18:  There is hyperdynamic left ventricular systolic function.  LV end diastolic pressure is normal.  The left ventricular ejection fraction is greater than 65% by visual estimate.  There is no mitral valve regurgitation.  There is no aortic valve stenosis.  Prox LAD to Mid LAD  lesion is 40% stenosed.  Prox Cx to Mid Cx lesion is 20% stenosed.  Ost 2nd Mrg lesion is 20% stenosed.  Angiographically minimal coronary disease with ectatic RCA and circumflex. Normal LVEDP and normal LV function/hyperdynamic.   Plan: Return to nursing unit for post catheterization care & TR band removal. Would be okay for discharge from cardiac standpoint after bedrest post TR band removal.  Defer further plans to primary team and consulting cardiologist.  Zio 03/12/19: Max 200 bpm 09:28pm, 07/28 Min 66 bpm 08:09am, 07/28 Avg 92 bpm Multiple episodes of SVT Fastest episode at 200 BPM for 14 beats Longest episode at 15 minutes with average HR of 137 bpm Symptoms of chest pain associated with sinus rhythm Artifact makes interpretation difficult   Patient Profile     65 y.o. male with a hx of nonobstructive CAD, HTN, HLD, diastolic HF, OSA on BiPAP, morbid obesity, depression and anxiety who presented with worsening SOB and LE edema found to have acute on chronic HF exacerbation for which Cardiology has been consulted.  Assessment & Plan    #Acute on chronic diastolic heart failure: Patient with known history of diastolic heart failure now acutely decompensated with worsening DOE, LE edema and orthopnea. Likely triggered by underlying infection. Review of monitor showed sinus tach with PACs; no definitive Afib. Initially required BiPAP now weaned to [facility] Appears grossly overloaded on examination with NYHA class III symptoms. -much improved UOP with increased dose of Lasix and Metolazone>>changed to Demadex 60mg  BID yesterday -He continues to diurese well with 4.6L out yesterday and net neg 12.5L -weight is inaccurate today but he has  lost over 25lbs since admit -SCr stable improved at 1.01 today and CO2 improved to 27 -BP improved and 126/16mmHg this am -continue Demadex 60mg  BID and spiro 25mg  daily -follow I&O's, daily weights and renal function -Low Na diet  #Known  history of SVT #Possible Afib: Monitor with possible runs of Afib however very difficult to determine if true Afib vs sinus tach with PACs and runs of SVT. Previous monitor in 2020 with SVT. Asked patient to bring in home monitor that was reportedly detecting Afib. -currently monitor shows runs of nonsustained atrial tachycardia with occasional PACs and PVCs -Metop had been held due to soft blood pressures but BP improved -will start Toprol XL 25mg  daily and titrate as needed for SVT and as BP tolerates -Continue telemetry -If true Afib captured on tele or ECG, would merit [facility]  #Nonobstructive CAD: -No evidence of ischemia and patient chest pain free.  -Minimal disease on cath in 2019 -Continue ASA and statin  #HTN: -BP stable at 126/34mmhg -Continue spiro 25mg  daily -starting Toprol XL 25mg  daily for control of SVT  #HLD: -Continue lipitor -LDL 58 this admit  #Morbid Obesity: -Consider ozempic vs wegovy as out-patient  #DMII: -Management per primary   #CKD: -Cr stable at 1.1 -continue to watch closely -Continue diuresis  #Cellulitis: #Leukocytosis: -On IV CTX per primary team    I have spent a total of 35 minutes with patient reviewing 2D echo , telemetry, EKGs, labs and examining patient as well as establishing an assessment and plan that was discussed with the patient.  > 50% of time was spent in direct patient care.    For questions or updates, please contact [place] Please consult [email or web address] for contact info under        Signed, Fransico Him, MD  10/21/2020, 8:34 AM

## 2020-10-21 NOTE — Progress Notes (Signed)
Pt is alert x3, however pt keeps on Pulling his tele leads off and urinated on the bed. Pt removed his lower leg dressing as well.    will monitor

## 2020-10-21 NOTE — Progress Notes (Signed)
   10/21/20 1543  Assess: MEWS Score  Temp 97.7 F (36.5 C)  BP 97/86  Pulse Rate 100  ECG Heart Rate (!) 101  Resp 13  SpO2 91 %  Assess: MEWS Score  MEWS Temp 0  MEWS Systolic 1  MEWS Pulse 1  MEWS RR 1  MEWS LOC 0  MEWS Score 3  MEWS Score Color Yellow  Treat  Pain Scale 0-10  Pain Score 2  2nd Pain Site  Pain Score 2  Document  Patient Outcome Stabilized after interventions  Progress note created (see row info) Yes  Patient has chronic low b/p and adjusting to to new Meidcations

## 2020-10-21 NOTE — Progress Notes (Signed)
Inpatient Diabetes Program Recommendations  AACE/ADA: New Consensus Statement on Inpatient Glycemic Control (2015)  Target Ranges:  Prepandial:   less than 140 mg/dL      Peak postprandial:   less than 180 mg/dL (1-2 hours)      Critically ill patients:  140 - 180 mg/dL   Lab Results  Component Value Date   GLUCAP 243 (H) 10/21/2020   HGBA1C 9.2 (H) 10/17/2020    Review of Glycemic Control Results for Lawrence [name], Lawrence "RAY" (MRN [id number]) as of 10/21/2020 11:04  Ref. Range 10/20/2020 06:03 10/20/2020 11:44 10/20/2020 16:19 10/20/2020 21:27 10/21/2020 06:21  Glucose-Capillary Latest Ref Range: 70 - 99 mg/dL 190 (H) 293 (H) 273 (H) 287 (H) 243 (H)   Diabetes history: DM2 Outpatient Diabetes medications: Lantus 26 units qd + Metformin 1 gm bid + Trulicity 3 mg q week Current orders for Inpatient glycemic control: Lantus 26 units + Novolog 4 units tid meal coverage + Novolog correction 0-15 units tid + hs 0-5 units  Inpatient Diabetes Program Recommendations:   -Increase Novolog meal coverage to 6 units tid if eats 50% Secure text to Dr. Kurtis Bushman.  Thank you, Nani Gasser. Hanks, RN, MSN, CDE  Diabetes Coordinator Inpatient Glycemic Control Team Team Pager [phone] (8am-5pm) 10/21/2020 11:06 AM

## 2020-10-21 NOTE — Progress Notes (Signed)
Neurology Progress Note  S: No further events c/f clinical seizure. Back to mental status baseline today with normal neurologic exam. Patient was unable to tolerate MRI brain last night while he was still post-ictal and confused but he would like to reattempt it tonight. He has been able to tolerate MRIs in the past. No new complaints.   Interval data:   EEG WNL and discontinued.   Exam  Vitals:   10/21/20 1855 10/21/20 1931  BP:  123/83  Pulse:  93  Resp:  15  Temp:  97.6 F (36.4 C)  SpO2: 91% 92%   MS: A&Ox4 Speech: fluent, no dysarthria or aphasia. Naming and repetition intact CN: PERRL, EOMI, sensation intact, strength intact, hearing intact to voice, tongue protrudes midline Motor: Normal bulk and tone, no rigidity, bradykinesia, or tremor. 5/5 strength x4 extrem Sensory: SILT Reflexes: 2+ and symmetric throughout with downgoing toes Coordination: intact FNF bilat Gait: deferred  A/P: p/w 2 witnessed events in 4 days concerning for partial seizure with impaired awareness. F/u MRI brain, spot EEG. Restarted home pregabalin, temazepam + added LEV. - F/u repeat MRI brain wwo - If MRI brain WNL will taper LEV - Will continue to follow  Su Monks, MD [facility] [phone]  If 7pm- 7am, please page neurology on call as listed in [place].

## 2020-10-21 NOTE — Evaluation (Signed)
Occupational Therapy Evaluation Patient Details Name: Lawrence [name] MRN: [id number] DOB: [birth year] Today's Date: 10/21/2020    History of Present Illness Patient is a 65 y/o male who presents on 10/17/20 with weight gain and SOB. CXR-pulmonary edema. Admitted with acute respiratory failure with hypoxia secondary to acute on chronic CHF. Also notd to have SVT vs A-fib. PMH includes diastolic CHF, COPD, CAD, DM2, PTSD/anxiey, HTN, venous insufficiency.   Clinical Impression   Pt is typically mod I for mobility with Rollator in home and scooter out of the house. He has an aide that completes bathing for him, he performs grooming seated at sink and otherwise does his own ADL. He does not drive, but does enjoy cooking occasionally. Pt today is mod A for transfers with RW (mod A for boost and min guard for short amb in room) max to total A for LB ADL. Pt able to complete grooming in recliner with set up. Pt will benefit from skilled OT in the acute setting prior to dc home with HHOT to focus on functioning and ADL in home environment AND ALSO IADL. Next OT session to introduce AE for LB ADL and continue education and practicing O2 monitoring and HR monitoring with activity.     Follow Up Recommendations  Home health OT    Equipment Recommendations  None recommended by OT (Pt has appropriate DME)    Recommendations for Other Services       Precautions / Restrictions Precautions Precautions: Fall;Other (comment) Precaution Comments: watch 02 Restrictions Weight Bearing Restrictions: No      Mobility Bed Mobility Overal bed mobility: Needs Assistance Bed Mobility: Supine to Sit     Supine to sit: Supervision;HOB elevated     General bed mobility comments: Increased time and heavy use of rail to get to EOB.    Transfers Overall transfer level: Needs assistance Equipment used: Rolling walker (2 wheeled) Transfers: Sit to/from Stand Sit to Stand: Mod assist         General  transfer comment: ASsist to power to standing with cues for hand placement and use of momentum. Increased effort/difficulty. Stood from [employer] x2    Balance Overall balance assessment: Needs assistance Sitting-balance support: Feet supported;Single extremity supported Sitting balance-Leahy Scale: Good     Standing balance support: During functional activity Standing balance-Leahy Scale: Poor Standing balance comment: Requires Ue support in standing.                           ADL either performed or assessed with clinical judgement   ADL Overall ADL's : Needs assistance/impaired Eating/Feeding: Set up;Sitting   Grooming: Wash/dry hands;Wash/dry face;Oral care;Brushing hair;Set up;Sitting Grooming Details (indicate cue type and reason): in recliner Upper Body Bathing: Moderate assistance;With caregiver independent assisting Upper Body Bathing Details (indicate cue type and reason): baseline Lower Body Bathing: Total assistance;With caregiver independent assisting Lower Body Bathing Details (indicate cue type and reason): baseline Upper Body Dressing : Min guard;Sitting   Lower Body Dressing: Moderate assistance;Sit to/from stand   Toilet Transfer: Moderate assistance;Ambulation;RW Toilet Transfer Details (indicate cue type and reason): mod A for initial boost, then min guard for short ambulation Toileting- Clothing Manipulation and Hygiene: Maximal assistance;Sit to/from stand       Functional mobility during ADLs: Moderate assistance;Min guard;Rolling walker (mod A for initial boost) General ADL Comments: HR elevated throughout ADL activity     Vision         Perception  Praxis      Pertinent Vitals/Pain Pain Assessment: Faces Faces Pain Scale: Hurts little more Pain Location: right knee Pain Descriptors / Indicators: Sore Pain Intervention(s): Limited activity within patient's tolerance;Monitored during session;Repositioned     Hand Dominance Right    Extremity/Trunk Assessment Upper Extremity Assessment Upper Extremity Assessment: Overall WFL for tasks assessed   Lower Extremity Assessment Lower Extremity Assessment: Defer to PT evaluation   Cervical / Trunk Assessment Cervical / Trunk Assessment: Other exceptions Cervical / Trunk Exceptions: excess body habitus   Communication Communication Communication: No difficulties   Cognition Arousal/Alertness: Awake/alert Behavior During Therapy: WFL for tasks assessed/performed Overall Cognitive Status: Within Functional Limits for tasks assessed                                     General Comments  Pt's partner present throughout session. Session conducted on RA with SpO2 >90 throughout session with brief drop into 89% with activity, HR elevated for a moment to 170 (Pt non-symptomatic)    Exercises     Shoulder Instructions      Home Living Family/patient expects to be discharged to:: Private residence Living Arrangements: Spouse/significant other Available Help at Discharge: Family;Available 24 hours/day Type of Home: Apartment Home Access: Level entry     Home Layout: One level     Bathroom Shower/Tub: Teacher, early years/pre: Handicapped height Bathroom Accessibility: No   Home Equipment: Environmental consultant - 4 wheels;Electric scooter;Hospital bed   Additional Comments: has shower seat and bsc but they won't fit in bathroom      Prior Functioning/Environment Level of Independence: Needs assistance  Gait / Transfers Assistance Needed: Uses rollator to ambulate in apartment and electric scooter for longer distances. Does some cooking. Limited household ambulator. ADL's / Homemaking Assistance Needed: HHaide assist with bathing; 4 days/week 3 hours. M T W, F.   Comments: both Pt and partner do not drive        OT Problem List: Decreased activity tolerance;Impaired balance (sitting and/or standing);Decreased knowledge of use of DME or AE;Decreased  knowledge of precautions;Cardiopulmonary status limiting activity;Obesity;Pain;Increased edema      OT Treatment/Interventions: Self-care/ADL training;Therapeutic exercise;DME and/or AE instruction;Therapeutic activities;Patient/family education;Balance training    OT Goals(Current goals can be found in the care plan section) Acute Rehab OT Goals Patient Stated Goal: to get better and go home OT Goal Formulation: With patient/family Time For Goal Achievement: 11/04/20 Potential to Achieve Goals: Good ADL Goals Pt Will Perform Grooming: with modified independence;sitting Pt Will Perform Upper Body Dressing: with modified independence;sitting Pt Will Perform Lower Body Dressing: with min guard assist;with adaptive equipment;sit to/from stand;with caregiver independent in assisting Pt Will Transfer to Toilet: with supervision;ambulating Pt Will Perform Toileting - Clothing Manipulation and hygiene: with min guard assist;with caregiver independent in assisting;sit to/from stand Additional ADL Goal #1: Pt will self-monitor O2 levels during ADL to maintain saturations >90% throughout and implement stratgies or supplemental O2 as needed  OT Frequency: Min 2X/week   Barriers to D/C:            Co-evaluation              AM-PAC OT "6 Clicks" Daily Activity     Outcome Measure Help from another person eating meals?: None Help from another person taking care of personal grooming?: A Little Help from another person toileting, which includes using toliet, bedpan, or urinal?: A Lot Help from  another person bathing (including washing, rinsing, drying)?: A Lot Help from another person to put on and taking off regular upper body clothing?: A Little Help from another person to put on and taking off regular lower body clothing?: A Lot 6 Click Score: 16   End of Session Equipment Utilized During Treatment: Rolling walker Nurse Communication: Mobility status  Activity Tolerance: Patient  tolerated treatment well Patient left: in chair;with call bell/phone within reach;with family/visitor present  OT Visit Diagnosis: Unsteadiness on feet (R26.81);Other abnormalities of gait and mobility (R26.89);Muscle weakness (generalized) (M62.81)                Time: 9675-9163 OT Time Calculation (min): 28 min Charges:  OT General Charges $OT Visit: 1 Visit OT Evaluation $OT Eval Moderate Complexity: 1 Mod OT Treatments $Self Care/Home Management : 8-22 mins Jesse Sans OTR/L Acute Rehabilitation Services Pager: [phone] Office: [place] 10/21/2020, 4:02 PM

## 2020-10-21 NOTE — Progress Notes (Signed)
PROGRESS NOTE    Lawrence [name]  ELF:[id number] DOB: 1956/05/24 DOA: 10/16/2020 PCP: Jenny Reichmann, PA-C (Inactive)    Brief Narrative:  65 year old with past medical history significant for hypertension, diabetes, hyperlipidemia, COPD, diastolic heart failure, venous stasis who presented via EMS complaining of shortness of breath.  Patient reported episode of shortness of breath for the last 2 or 3 days prior to admission worse at night.  He was evaluated by EMS and he was found to have oxygen saturation in the 70s.  He was placed on a normal breather mask and subsequently on BiPAP.  He received high-dose methylprednisolone on in route.  Evaluation in the ED chest x-ray show pulmonary edema, initial troponin was 14.  Patient admitted for heart failure exacerbation.   3/16-EEG nml limits. No seizures or epileptiform .   Consultants:     Procedures:   Antimicrobials:       Subjective: Less sob, no dizziness, or  cp  Objective: Vitals:   10/21/20 0800 10/21/20 1146 10/21/20 1543 10/21/20 1855  BP:  120/64 97/86   Pulse: (!) 102 (!) 102 100   Resp: (!) 23 18 13    Temp:  97.7 F (36.5 C) 97.7 F (36.5 C)   TempSrc:  Oral Oral   SpO2: 90% 92% 91% 91%  Weight:      Height:        Intake/Output Summary (Last 24 hours) at 10/21/2020 1915 Last data filed at 10/21/2020 1415 Gross per 24 hour  Intake 1060 ml  Output 2725 ml  Net -1665 ml   Filed Weights   10/19/20 0154 10/20/20 0527 10/21/20 0139  Weight: (!) 183.6 kg (!) 175.2 kg 97.5 kg    Examination:  General exam: Appears calm and comfortable  Respiratory system: decrease bs at bases, no w/r Cardiovascular system: S1 & S2 heard, RRR. No JVD, murmurs, rubs, gallops or clicks.  Gastrointestinal system: Abdomen is nondistended, soft and nontender.  Normal bowel sounds heard. Central nervous system: grossly intact Extremities: trace edema Skin: warm,dry     Data Reviewed: I have personally reviewed  following labs and imaging studies  CBC: Recent Labs  Lab 10/16/20 2343 10/16/20 2359 10/17/20 0347 10/18/20 0446 10/19/20 0333 10/20/20 0344 10/21/20 0433  WBC 19.3*  --  25.7* 20.1* 14.5* 12.5* 15.4*  NEUTROABS 17.5*  --   --   --   --   --   --   HGB 13.9   < > 14.1 12.5* 13.2 13.3 14.4  HCT 43.3   < > 43.2 37.4* 39.9 39.8 42.7  MCV 89.1  --  88.2 87.6 87.7 87.1 86.1  PLT 288  --  281 276 293 312 351   < > = values in this interval not displayed.   Basic Metabolic Panel: Recent Labs  Lab 10/17/20 0347 10/17/20 0701 10/17/20 1625 10/18/20 0446 10/19/20 0333 10/20/20 0344 10/21/20 0433  NA  --  130*  --  127* 131* 131* 129*  K  --  3.3*  --  3.7 3.7 4.0 4.3  CL  --  88*  --  87* 88* 87* 90*  CO2  --  30  --  32 31 35* 27  GLUCOSE  --  341* 447* 286* 233* 197* 234*  BUN  --  18  --  29* 32* 24* 22  CREATININE 1.01 1.01  --  0.94 1.17 1.14 1.01  CALCIUM  --  9.0  --  8.5* 8.8* 9.4 9.2  MG 1.8 1.8  --   --  2.1  --  2.1   GFR: Estimated Creatinine Clearance: 87 mL/min (by C-G formula based on SCr of 1.01 mg/dL). Liver Function Tests: Recent Labs  Lab 10/16/20 2343  AST 25  ALT 26  ALKPHOS 77  BILITOT 1.0  PROT 7.4  ALBUMIN 3.7   No results for input(s): LIPASE, AMYLASE in the last 168 hours. No results for input(s): AMMONIA in the last 168 hours. Coagulation Profile: No results for input(s): INR, PROTIME in the last 168 hours. Cardiac Enzymes: No results for input(s): CKTOTAL, CKMB, CKMBINDEX, TROPONINI in the last 168 hours. BNP (last 3 results) No results for input(s): PROBNP in the last 8760 hours. HbA1C: No results for input(s): HGBA1C in the last 72 hours. CBG: Recent Labs  Lab 10/20/20 1619 10/20/20 2127 10/21/20 0621 10/21/20 1143 10/21/20 1538  GLUCAP 273* 287* 243* 354* 280*   Lipid Profile: No results for input(s): CHOL, HDL, LDLCALC, TRIG, CHOLHDL, LDLDIRECT in the last 72 hours. Thyroid Function Tests: No results for input(s): TSH,  T4TOTAL, FREET4, T3FREE, THYROIDAB in the last 72 hours. Anemia Panel: No results for input(s): VITAMINB12, FOLATE, FERRITIN, TIBC, IRON, RETICCTPCT in the last 72 hours. Sepsis Labs: No results for input(s): PROCALCITON, LATICACIDVEN in the last 168 hours.  Recent Results (from the past 240 hour(s))  SARS CORONAVIRUS 2 (TAT 6-24 HRS) Nasopharyngeal Nasopharyngeal Swab     Status: None   Collection Time: 10/16/20 11:42 PM   Specimen: Nasopharyngeal Swab  Result Value Ref Range Status   SARS Coronavirus 2 NEGATIVE NEGATIVE Final    Comment: (NOTE) SARS-CoV-2 target nucleic acids are NOT DETECTED.  The SARS-CoV-2 RNA is generally detectable in upper and lower respiratory specimens during the acute phase of infection. Negative results do not preclude SARS-CoV-2 infection, do not rule out co-infections with other pathogens, and should not be used as the sole basis for treatment or other patient management decisions. Negative results must be combined with clinical observations, patient history, and epidemiological information. The expected result is Negative.  Fact Sheet for Patients: SugarRoll.be  Fact Sheet for Healthcare Providers: https://www.woods-mathews.com/  This test is not yet approved or cleared by the [place] FDA and  has been authorized for detection and/or diagnosis of SARS-CoV-2 by FDA under an Emergency Use Authorization (EUA). This EUA will remain  in effect (meaning this test can be used) for the duration of the COVID-19 declaration under Se ction 564(b)(1) of the Act, 21 U.S.C. section 360bbb-3(b)(1), unless the authorization is terminated or revoked sooner.  Performed at [facility], [place] [address], [place], [place] [address]          Radiology Studies: MR BRAIN WO CONTRAST  Result Date: 10/21/2020 CLINICAL DATA:  Initial evaluation for acute seizure. EXAM: MRI HEAD WITHOUT CONTRAST TECHNIQUE:  Multiplanar, multiecho pulse sequences of the brain and surrounding structures were obtained without intravenous contrast. COMPARISON:  None. FINDINGS: Brain: Examination severely limited by extensive motion artifact and patient's inability to tolerate the full length of the study. Cerebral volume within normal limits for age. No definite focal parenchymal signal abnormality. No abnormal foci of restricted diffusion to suggest acute or subacute ischemia. Gray-white matter differentiation maintained. No encephalomalacia to suggest chronic cortical infarction. No definite evidence for acute or chronic intracranial hemorrhage. No visible mass lesion, mass effect or midline shift. No hydrocephalus or extra-axial fluid collection. Pituitary gland suprasellar region grossly normal. Midline structures intact. Vascular: Major intracranial vascular flow voids are maintained. Skull and upper cervical spine: Craniocervical junction grossly  within normal limits. Bone marrow signal intensity grossly normal. No visible scalp soft tissue abnormality. Sinuses/Orbits: Globes and orbital soft tissues within normal limits. Paranasal sinuses are largely clear. No significant mastoid effusion. Other: None. IMPRESSION: 1. Technically limited exam due to extensive motion artifact and patient's inability to tolerate the full length of the study. 2. Grossly negative brain MRI. No acute intracranial abnormality identified. Electronically Signed   By: Jeannine Boga M.D.   On: 10/21/2020 02:20   EEG adult  Result Date: 10/21/2020 Lora Havens, MD     10/21/2020  9:11 AM Patient Name: Lawrence [name] MRN: [id number] Epilepsy Attending: Lora Havens Referring Physician/Provider: Dr. Linus Mako Date: 10/20/2020 Duration: 45 mins Patient history: 65 yo man w/ no previous seizure history p/w 2 witnessed events in 4 days concerning for partial seizure with impaired awareness.  EEG to evaluate for seizure. Level of alertness: Awake  AEDs during EEG study: Keppra, pregabalin, temazepam Technical aspects: This EEG study was done with scalp electrodes positioned according to the 10-20 International system of electrode placement. Electrical activity was acquired at a sampling rate of 500Hz  and reviewed with a high frequency filter of 70Hz  and a low frequency filter of 1Hz . EEG data were recorded continuously and digitally stored. Description: The posterior dominant rhythm consists of 8 Hz activity of moderate voltage (25-35 uV) seen predominantly in posterior head regions, symmetric and reactive to eye opening and eye closing. Physiologic photic driving was not seen during photic stimulation.  Hyperventilation was not performed.   IMPRESSION: This study is within normal limits. No seizures or epileptiform discharges were seen throughout the recording. Priyanka O Yadav   VAS [place] LOWER EXTREMITY VENOUS (DVT)  Result Date: 10/20/2020  Lower Venous DVT Study Indications: Swelling. Other Indications: S/P RT GSV ablation from proximal thigh to calf. Comparison Study: 12-05-2019 Lower extermity venous RT study was negative for                   DVT. Performing Technologist: Darlin Coco RDMS,RVT  Examination Guidelines: A complete evaluation includes B-mode imaging, spectral Doppler, color Doppler, and power Doppler as needed of all accessible portions of each vessel. Bilateral testing is considered an integral part of a complete examination. Limited examinations for reoccurring indications may be performed as noted. The reflux portion of the exam is performed with the patient in reverse Trendelenburg.  +---------+---------------+---------+-----------+----------+-------------------+ RIGHT    CompressibilityPhasicitySpontaneityPropertiesThrombus Aging      +---------+---------------+---------+-----------+----------+-------------------+ CFV      Full           Yes      Yes                                       +---------+---------------+---------+-----------+----------+-------------------+ SFJ      Full                                                             +---------+---------------+---------+-----------+----------+-------------------+ FV Prox  Full                                                             +---------+---------------+---------+-----------+----------+-------------------+  FV Mid   Full                                                             +---------+---------------+---------+-----------+----------+-------------------+ FV DistalFull                                                             +---------+---------------+---------+-----------+----------+-------------------+ PFV      Full                                                             +---------+---------------+---------+-----------+----------+-------------------+ POP      Full           Yes      Yes                                      +---------+---------------+---------+-----------+----------+-------------------+ PTV                     Yes      Yes                                      +---------+---------------+---------+-----------+----------+-------------------+ PERO                                                  Not well visualized +---------+---------------+---------+-----------+----------+-------------------+   +---------+---------------+---------+-----------+----------+-------------------+ LEFT     CompressibilityPhasicitySpontaneityPropertiesThrombus Aging      +---------+---------------+---------+-----------+----------+-------------------+ CFV      Full           Yes      Yes                                      +---------+---------------+---------+-----------+----------+-------------------+ SFJ      Full                                                             +---------+---------------+---------+-----------+----------+-------------------+ FV  Prox  Full                                                             +---------+---------------+---------+-----------+----------+-------------------+ FV Mid   Full                                                             +---------+---------------+---------+-----------+----------+-------------------+  FV DistalFull                                                             +---------+---------------+---------+-----------+----------+-------------------+ PFV      Full                                                             +---------+---------------+---------+-----------+----------+-------------------+ POP      Full           Yes      Yes                                      +---------+---------------+---------+-----------+----------+-------------------+ PTV                     Yes      Yes                                      +---------+---------------+---------+-----------+----------+-------------------+ PERO                                                  Not well visualized +---------+---------------+---------+-----------+----------+-------------------+     Summary: RIGHT: - There is no evidence of deep vein thrombosis in the lower extremity. However, portions of this examination were limited- see technologist comments above.  - No cystic structure found in the popliteal fossa.  LEFT: - There is no evidence of deep vein thrombosis in the lower extremity. However, portions of this examination were limited- see technologist comments above.  - No cystic structure found in the popliteal fossa.  *See table(s) above for measurements and observations. Electronically signed by [name] MD on 10/20/2020 at 3:47:37 PM.    Final         Scheduled Meds: . ARIPiprazole  5 mg Oral QHS  . aspirin EC  81 mg Oral Daily  . atorvastatin  40 mg Oral Daily  . enoxaparin (LOVENOX) injection  90 mg Subcutaneous Q24H  . fluticasone  2 spray Each Nare Daily   . insulin aspart  0-15 Units Subcutaneous TID WC  . insulin aspart  0-5 Units Subcutaneous QHS  . insulin aspart  4 Units Subcutaneous TID WC  . insulin glargine  26 Units Subcutaneous Daily  . metoprolol succinate  25 mg Oral Daily  . potassium chloride  40 mEq Oral Daily  . pregabalin  100 mg Oral TID  . sodium chloride flush  3 mL Intravenous Q12H  . spironolactone  25 mg Oral Daily  . sulfamethoxazole-trimethoprim  1 tablet Oral Q12H  . temazepam  15 mg Oral QHS  . torsemide  60 mg Oral BID  . traZODone  200 mg Oral QHS  . venlafaxine XR  225 mg Oral Daily   Continuous Infusions: . sodium chloride Stopped (10/18/20 1255)  .  sodium chloride Stopped (10/19/20 2055)  . cefTRIAXone (ROCEPHIN)  IV 1 g (10/21/20 0829)  . levETIRAcetam 1,000 mg (10/21/20 2446)    Assessment & Plan:   Principal Problem:   Acute on chronic diastolic CHF (congestive heart failure) (HCC) Active Problems:   Morbid obesity ([place])   Acute respiratory failure with hypoxia (HCC)   Diabetes mellitus type 2 in obese (HCC)   QT prolongation   Essential hypertension   Hypokalemia   Seizure ([place])   1-Acute Hypoxic Respiratory Failure: Secondary to Acute Diastolic Heart Failure Exacerbation -Patient presented hypoxic oxygen saturation 70% initially required BiPAP and nonrebreather mask. 3/16-clinically improving Good UO Hold bb and acei due to soft bp Lasix changed to Demadex 60mg  bid po by cards I/o Daily weight Continue aldactone 25mg  po qed   2-Partial Seizure -Patient develops right arm tremor/jerking movement. He is alert, answer questions. Per wife patient had this tremors on admission, that was why she brought him here on Friday, he had episode of urinary incontinence as well. He hasn't had tremors since admission.  -He has been off Effexor, since admission, but per wife he was having this tremors at home and he was taking his effexor.  3/16-EEG negative Neurology following Mri  brain-grossly negative Continue temazepam  pregabalin increased to 100mg  tid keprra load 3g /f/b 1000mg  bid   3-Diabetes type 2 hyperglycemia: -BG elevated A1c 9.2 Increase lantus to 30 u daily Increase novology to 8units tid with meals   -Hypertension: bp low, holding acei and bb   -Hypokalemia: replaced.   -prolonged QT: PVC: received mg, k replaced.   -Morbid obesity: diet education.  -LE Cellulitis; on chronic venous stasis.  Patient presents with worsening redness; started Ceftriaxone, and Bactrim.  WBC trending down. Suspect leukocytosis is also elevated secondary to steroids.  Wound care consulted, appreciate assistance.  Doppler: negative for DVT>  Less redness.   -SVT/ ;[facility] Telemetry Cardiology consulted.  HR elevated, BP improved. Per cardiology continue to hold BB  -COPD; Resume Inhaler.  -Hypotension; BP low; hold lisinopril.  Depression;  Resume effexor, abilify/ Temazepam.  Continue with lyrica.    Estimated body mass index is 49.6 kg/m as calculated from the following:   Height as of this encounter: 6\' 2"  (1.88 m).   Weight as of this encounter: 175.2 kg.     DVT prophylaxis: lovenox Code Status:DNR Family Communication: none at bedside Status is: Inpatient  Remains inpatient appropriate because:Inpatient level of care appropriate due to severity of illness   Dispo: The patient is from: Home              Anticipated d/c is to: Home              Patient currently is not medically stable to d/c.   Difficult to place patient No            LOS: 4 days   Time spent: 35 min with >50% on co    Nolberto Hanlon, MD [facility] Pager [phone]  If 7PM-7AM, please contact night-coverage 10/21/2020, 7:15 PM

## 2020-10-21 NOTE — TOC Transition Note (Signed)
Transition of Care [facility]) - CM/SW Discharge Note   Patient Details  Name: Lawrence [name] MRN: [id number] Date of Birth: [birth year]  [facility] CM/SW Contact:  Zenon Mayo, RN Phone Number: 10/21/2020, 4:40 PM   Clinical Narrative:    NCM spoke with patient, he has bp cuff at home.  He is active with [facility], would like to continue with them.  NCM confirmed with [place].  Will add HHOT, HHPT, to the services.  NCM notified MD to put in orders.    Final next level of care: [place] Barriers to Discharge: Continued Medical Work up   Patient Goals and CMS Choice Patient states their goals for this hospitalization and ongoing recovery are:: get better CMS Medicare.gov Compare Post Acute Care list provided to:: Patient Choice offered to / list presented to : Patient  Discharge Placement                       Discharge Plan and Services                  DME Agency: NA       HH Arranged: RN,PT,OT [place] Agency: [facility] ([place]) Date [place]: 10/21/20 Time [place]: 1640 Representative spoke with at [place]: [place]  Social Determinants of Health ([place]) Interventions     Readmission Risk Interventions Readmission Risk Prevention Plan 12/06/2019  Transportation Screening Complete  PCP or Specialist Appt within 5-7 Days Complete  Home Care Screening Complete  Medication Review (RN CM) Complete  Some recent data might be hidden

## 2020-10-21 NOTE — Care Management Important Message (Signed)
Important Message  Patient Details  Name: Lawrence [name] MRN: [id number] Date of Birth: 08-19-1955   Medicare Important Message Given:  Yes     Shelda Altes 10/21/2020, 10:17 AM

## 2020-10-21 NOTE — Procedures (Addendum)
Patient Name: Lawrence [name]  MRN: [id number]  Epilepsy Attending: Lora Havens  Referring Physician/Provider: Dr. Linus Mako Date: 10/20/2020 Duration: 45 mins  Patient history: 65 yo man w/ no previous seizure history p/w 2 witnessed events in 4 days concerning for partial seizure with impaired awareness.  EEG to evaluate for seizure.  Level of alertness: Awake  AEDs during EEG study: Keppra, pregabalin, temazepam  Technical aspects: This EEG study was done with scalp electrodes positioned according to the 10-20 International system of electrode placement. Electrical activity was acquired at a sampling rate of 500Hz  and reviewed with a high frequency filter of 70Hz  and a low frequency filter of 1Hz . EEG data were recorded continuously and digitally stored.   Description: The posterior dominant rhythm consists of 8 Hz activity of moderate voltage (25-35 uV) seen predominantly in posterior head regions, symmetric and reactive to eye opening and eye closing. Physiologic photic driving was not seen during photic stimulation.  Hyperventilation was not performed.     IMPRESSION: This study is within normal limits. No seizures or epileptiform discharges were seen throughout the recording.  Lawrence [name]

## 2020-10-22 ENCOUNTER — Inpatient Hospital Stay (HOSPITAL_COMMUNITY): Payer: Medicare Other

## 2020-10-22 ENCOUNTER — Ambulatory Visit: Payer: Medicare Other | Admitting: Student

## 2020-10-22 DIAGNOSIS — I10 Essential (primary) hypertension: Secondary | ICD-10-CM | POA: Diagnosis not present

## 2020-10-22 DIAGNOSIS — I471 Supraventricular tachycardia, unspecified: Secondary | ICD-10-CM

## 2020-10-22 DIAGNOSIS — R569 Unspecified convulsions: Secondary | ICD-10-CM | POA: Diagnosis not present

## 2020-10-22 DIAGNOSIS — I2510 Atherosclerotic heart disease of native coronary artery without angina pectoris: Secondary | ICD-10-CM

## 2020-10-22 DIAGNOSIS — I5033 Acute on chronic diastolic (congestive) heart failure: Secondary | ICD-10-CM | POA: Diagnosis not present

## 2020-10-22 DIAGNOSIS — I2583 Coronary atherosclerosis due to lipid rich plaque: Secondary | ICD-10-CM

## 2020-10-22 LAB — BASIC METABOLIC PANEL
Anion gap: 11 (ref 5–15)
BUN: 25 mg/dL — ABNORMAL HIGH (ref 8–23)
CO2: 28 mmol/L (ref 22–32)
Calcium: 9.1 mg/dL (ref 8.9–10.3)
Chloride: 89 mmol/L — ABNORMAL LOW (ref 98–111)
Creatinine, Ser: 1.14 mg/dL (ref 0.61–1.24)
GFR, Estimated: >60 mL/min (ref >60)
Glucose, Bld: 189 mg/dL — ABNORMAL HIGH (ref 70–99)
Potassium: 3.9 mmol/L (ref 3.5–5.1)
Sodium: 128 mmol/L — ABNORMAL LOW (ref 135–145)

## 2020-10-22 LAB — GLUCOSE, CAPILLARY
Glucose-Capillary: 189 mg/dL — ABNORMAL HIGH (ref 70–99)
Glucose-Capillary: 290 mg/dL — ABNORMAL HIGH (ref 70–99)
Glucose-Capillary: 311 mg/dL — ABNORMAL HIGH (ref 70–99)
Glucose-Capillary: 232 mg/dL — ABNORMAL HIGH (ref 70–99)

## 2020-10-22 LAB — BRAIN NATRIURETIC PEPTIDE: B Natriuretic Peptide: 36.3 pg/mL (ref 0.0–100.0)

## 2020-10-22 MED ORDER — INSULIN ASPART 100 UNIT/ML ~~LOC~~ SOLN
11.0000 [IU] | Freq: Three times a day (TID) | SUBCUTANEOUS | Status: DC
  Administered 2020-10-23 (×2): 11 [IU] via SUBCUTANEOUS

## 2020-10-22 MED ORDER — INSULIN GLARGINE 100 UNIT/ML ~~LOC~~ SOLN
35.0000 [IU] | Freq: Every day | SUBCUTANEOUS | Status: DC
  Administered 2020-10-23: 35 [IU] via SUBCUTANEOUS
  Filled 2020-10-22: qty 0.35

## 2020-10-22 MED ORDER — TORSEMIDE 20 MG PO TABS
40.0000 mg | ORAL_TABLET | Freq: Two times a day (BID) | ORAL | Status: DC
  Administered 2020-10-22: 40 mg via ORAL
  Filled 2020-10-22: qty 2

## 2020-10-22 NOTE — Progress Notes (Signed)
Progress Note  Patient Name: Lawrence [name] Date of Encounter: 10/22/2020  [facility] Cardiologist: Fransico Him, MD   Subjective   No chest pain or SOB  Diuresing well on Demadex and put out 2.7L and is net neg 14.2L  Weight down at least 25lbs from admit but increased 3 lbs from 2 days ago despite ongoing diuresis   Inpatient Medications    Scheduled Meds: . ARIPiprazole  5 mg Oral QHS  . aspirin EC  81 mg Oral Daily  . atorvastatin  40 mg Oral Daily  . enoxaparin (LOVENOX) injection  90 mg Subcutaneous Q24H  . fluticasone  2 spray Each Nare Daily  . insulin aspart  0-15 Units Subcutaneous TID WC  . insulin aspart  0-5 Units Subcutaneous QHS  . insulin aspart  8 Units Subcutaneous TID WC  . insulin glargine  30 Units Subcutaneous Daily  . metoprolol succinate  25 mg Oral Daily  . potassium chloride  40 mEq Oral Daily  . pregabalin  100 mg Oral TID  . sodium chloride flush  3 mL Intravenous Q12H  . spironolactone  25 mg Oral Daily  . sulfamethoxazole-trimethoprim  1 tablet Oral Q12H  . temazepam  15 mg Oral QHS  . traZODone  200 mg Oral QHS  . venlafaxine XR  225 mg Oral Daily   Continuous Infusions: . sodium chloride Stopped (10/18/20 1255)  . sodium chloride Stopped (10/19/20 2055)  . cefTRIAXone (ROCEPHIN)  IV 1 g (10/21/20 0829)  . levETIRAcetam 1,000 mg (10/22/20 0558)   PRN Meds: sodium chloride, sodium chloride, acetaminophen, albuterol, LORazepam, oxyCODONE, sodium chloride flush   Vital Signs    Vitals:   10/21/20 2108 10/22/20 0039 10/22/20 0400 10/22/20 0808  BP:  117/76 123/85 (!) 154/105  Pulse: 93 87 83 93  Resp: 15 16 17 18   Temp:  97.6 F (36.4 C) 98.5 F (36.9 C) 98 F (36.7 C)  TempSrc:  Oral Oral Oral  SpO2: 94% 95% 90% 98%  Weight:    (!) 178.6 kg  Height:        Intake/Output Summary (Last 24 hours) at 10/22/2020 1016 Last data filed at 10/22/2020 4917 Gross per 24 hour  Intake 480 ml  Output 2050 ml  Net -1570 ml    Last 3 Weights 10/22/2020 10/21/2020 10/20/2020  Weight (lbs) 393 lb 11.9 oz 215 lb 386 lb 4.8 oz  Weight (kg) 178.6 kg 97.523 kg 175.225 kg      Telemetry  NSR with PACs- Personally Reviewed  ECG    No new EKG to review - Personally Reviewed  Physical Exam   GEN: Well nourished, well developed in no acute distress, morbidly obese HEENT: Normal NECK: No JVD; No carotid bruits LYMPHATICS: No lymphadenopathy CARDIAC:RRR, no murmurs, rubs, gallops RESPIRATORY:  Clear to auscultation without rales, wheezing or rhonchi  ABDOMEN: Soft, non-tender, non-distended MUSCULOSKELETAL: chronic LE edema with chronic venous stasis changes; No deformity  SKIN: Warm and dry NEUROLOGIC:  Alert and oriented x 3 PSYCHIATRIC:  Normal affect    Labs    High Sensitivity Troponin:   Recent Labs  Lab 10/16/20 2343 10/17/20 0143 10/17/20 0628 10/17/20 0701  TROPONINIHS 14 18* 14 12      Chemistry Recent Labs  Lab 10/16/20 2343 10/16/20 2359 10/20/20 0344 10/21/20 0433 10/22/20 0345  NA 132*   < > 131* 129* 128*  K 3.3*   < > 4.0 4.3 3.9  CL 91*   < > 87* 90* 89*  CO2 30   < > 35* 27 28  GLUCOSE 253*   < > 197* 234* 189*  BUN 17   < > 24* 22 25*  CREATININE 1.08   < > 1.14 1.01 1.14  CALCIUM 9.3   < > 9.4 9.2 9.1  PROT 7.4  --   --   --   --   ALBUMIN 3.7  --   --   --   --   AST 25  --   --   --   --   ALT 26  --   --   --   --   ALKPHOS 77  --   --   --   --   BILITOT 1.0  --   --   --   --   GFRNONAA >60   < > >60 >60 >60  ANIONGAP 11   < > 9 12 11    < > = values in this interval not displayed.     Hematology Recent Labs  Lab 10/19/20 0333 10/20/20 0344 10/21/20 0433  WBC 14.5* 12.5* 15.4*  RBC 4.55 4.57 4.96  HGB 13.2 13.3 14.4  HCT 39.9 39.8 42.7  MCV 87.7 87.1 86.1  MCH 29.0 29.1 29.0  MCHC 33.1 33.4 33.7  RDW 14.9 14.8 14.6  PLT 293 312 351    BNP Recent Labs  Lab 10/16/20 2343  BNP 61.4     DDimer No results for input(s): DDIMER in the last 168  hours.   Radiology    MR BRAIN WO CONTRAST  Result Date: 10/21/2020 CLINICAL DATA:  Initial evaluation for acute seizure. EXAM: MRI HEAD WITHOUT CONTRAST TECHNIQUE: Multiplanar, multiecho pulse sequences of the brain and surrounding structures were obtained without intravenous contrast. COMPARISON:  None. FINDINGS: Brain: Examination severely limited by extensive motion artifact and patient's inability to tolerate the full length of the study. Cerebral volume within normal limits for age. No definite focal parenchymal signal abnormality. No abnormal foci of restricted diffusion to suggest acute or subacute ischemia. Gray-white matter differentiation maintained. No encephalomalacia to suggest chronic cortical infarction. No definite evidence for acute or chronic intracranial hemorrhage. No visible mass lesion, mass effect or midline shift. No hydrocephalus or extra-axial fluid collection. Pituitary gland suprasellar region grossly normal. Midline structures intact. Vascular: Major intracranial vascular flow voids are maintained. Skull and upper cervical spine: Craniocervical junction grossly within normal limits. Bone marrow signal intensity grossly normal. No visible scalp soft tissue abnormality. Sinuses/Orbits: Globes and orbital soft tissues within normal limits. Paranasal sinuses are largely clear. No significant mastoid effusion. Other: None. IMPRESSION: 1. Technically limited exam due to extensive motion artifact and patient's inability to tolerate the full length of the study. 2. Grossly negative brain MRI. No acute intracranial abnormality identified. Electronically Signed   By: Jeannine Boga M.D.   On: 10/21/2020 02:20   EEG adult  Result Date: 10/21/2020 Lora Havens, MD     10/21/2020  9:11 AM Patient Name: Lawrence [name] MRN: [id number] Epilepsy Attending: Lora Havens Referring Physician/Provider: Dr. Linus Mako Date: 10/20/2020 Duration: 45 mins Patient history: 65 yo man w/ no  previous seizure history p/w 2 witnessed events in 4 days concerning for partial seizure with impaired awareness.  EEG to evaluate for seizure. Level of alertness: Awake AEDs during EEG study: Keppra, pregabalin, temazepam Technical aspects: This EEG study was done with scalp electrodes positioned according to the 10-20 International system of electrode placement. Electrical activity was acquired at a sampling  rate of 500Hz  and reviewed with a high frequency filter of 70Hz  and a low frequency filter of 1Hz . EEG data were recorded continuously and digitally stored. Description: The posterior dominant rhythm consists of 8 Hz activity of moderate voltage (25-35 uV) seen predominantly in posterior head regions, symmetric and reactive to eye opening and eye closing. Physiologic photic driving was not seen during photic stimulation.  Hyperventilation was not performed.   IMPRESSION: This study is within normal limits. No seizures or epileptiform discharges were seen throughout the recording. Priyanka O Yadav   VAS [place] LOWER EXTREMITY VENOUS (DVT)  Result Date: 10/20/2020  Lower Venous DVT Study Indications: Swelling. Other Indications: S/P RT GSV ablation from proximal thigh to calf. Comparison Study: 12-05-2019 Lower extermity venous RT study was negative for                   DVT. Performing Technologist: Darlin Coco RDMS,RVT  Examination Guidelines: A complete evaluation includes B-mode imaging, spectral Doppler, color Doppler, and power Doppler as needed of all accessible portions of each vessel. Bilateral testing is considered an integral part of a complete examination. Limited examinations for reoccurring indications may be performed as noted. The reflux portion of the exam is performed with the patient in reverse Trendelenburg.  +---------+---------------+---------+-----------+----------+-------------------+ RIGHT    CompressibilityPhasicitySpontaneityPropertiesThrombus Aging       +---------+---------------+---------+-----------+----------+-------------------+ CFV      Full           Yes      Yes                                      +---------+---------------+---------+-----------+----------+-------------------+ SFJ      Full                                                             +---------+---------------+---------+-----------+----------+-------------------+ FV Prox  Full                                                             +---------+---------------+---------+-----------+----------+-------------------+ FV Mid   Full                                                             +---------+---------------+---------+-----------+----------+-------------------+ FV DistalFull                                                             +---------+---------------+---------+-----------+----------+-------------------+ PFV      Full                                                             +---------+---------------+---------+-----------+----------+-------------------+  POP      Full           Yes      Yes                                      +---------+---------------+---------+-----------+----------+-------------------+ PTV                     Yes      Yes                                      +---------+---------------+---------+-----------+----------+-------------------+ PERO                                                  Not well visualized +---------+---------------+---------+-----------+----------+-------------------+   +---------+---------------+---------+-----------+----------+-------------------+ LEFT     CompressibilityPhasicitySpontaneityPropertiesThrombus Aging      +---------+---------------+---------+-----------+----------+-------------------+ CFV      Full           Yes      Yes                                      +---------+---------------+---------+-----------+----------+-------------------+  SFJ      Full                                                             +---------+---------------+---------+-----------+----------+-------------------+ FV Prox  Full                                                             +---------+---------------+---------+-----------+----------+-------------------+ FV Mid   Full                                                             +---------+---------------+---------+-----------+----------+-------------------+ FV DistalFull                                                             +---------+---------------+---------+-----------+----------+-------------------+ PFV      Full                                                             +---------+---------------+---------+-----------+----------+-------------------+ POP      Full  Yes      Yes                                      +---------+---------------+---------+-----------+----------+-------------------+ PTV                     Yes      Yes                                      +---------+---------------+---------+-----------+----------+-------------------+ PERO                                                  Not well visualized +---------+---------------+---------+-----------+----------+-------------------+     Summary: RIGHT: - There is no evidence of deep vein thrombosis in the lower extremity. However, portions of this examination were limited- see technologist comments above.  - No cystic structure found in the popliteal fossa.  LEFT: - There is no evidence of deep vein thrombosis in the lower extremity. However, portions of this examination were limited- see technologist comments above.  - No cystic structure found in the popliteal fossa.  *See table(s) above for measurements and observations. Electronically signed by [name] MD on 10/20/2020 at 3:47:37 PM.    Final     Cardiac Studies   TTE 03/08/19: IMPRESSIONS  1. The left  ventricle has normal systolic function, with an ejection  fraction of 55-60%. The cavity size was normal. There is moderate  concentric left ventricular hypertrophy. Left ventricular diastolic  Doppler parameters are consistent with  pseudonormalization. No evidence of left ventricular regional wall motion  abnormalities.  2. The right ventricle has normal systolic function. The cavity was  normal. There is no increase in right ventricular wall thickness.  3. Left atrial size was mildly dilated.  4. Right atrial size was mildly dilated.  5. Mild thickening of the mitral valve leaflet. Mild calcification of the  mitral valve leaflet. There is moderate to severe mitral annular  calcification present.  6. The aortic valve is tricuspid. Moderate thickening of the aortic  valve. Moderate calcification of the aortic valve. Mild stenosis of the  aortic valve.  7. The aorta is normal in size and structure.  8. The aortic root is normal in size and structure.   Cath 01/17/18:  There is hyperdynamic left ventricular systolic function.  LV end diastolic pressure is normal.  The left ventricular ejection fraction is greater than 65% by visual estimate.  There is no mitral valve regurgitation.  There is no aortic valve stenosis.  Prox LAD to Mid LAD lesion is 40% stenosed.  Prox Cx to Mid Cx lesion is 20% stenosed.  Ost 2nd Mrg lesion is 20% stenosed.  Angiographically minimal coronary disease with ectatic RCA and circumflex. Normal LVEDP and normal LV function/hyperdynamic.   Plan: Return to nursing unit for post catheterization care & TR band removal. Would be okay for discharge from cardiac standpoint after bedrest post TR band removal.  Defer further plans to primary team and consulting cardiologist.  Zio 03/12/19: Max 200 bpm 09:28pm, 07/28 Min 66 bpm 08:09am, 07/28 Avg 92 bpm Multiple episodes of SVT Fastest episode at 200 BPM for 14 beats Longest episode at  15  minutes with average HR of 137 bpm Symptoms of chest pain associated with sinus rhythm Artifact makes interpretation difficult   Patient Profile     65 y.o. male with a hx of nonobstructive CAD, HTN, HLD, diastolic HF, OSA on BiPAP, morbid obesity, depression and anxiety who presented with worsening SOB and LE edema found to have acute on chronic HF exacerbation for which Cardiology has been consulted.  Assessment & Plan    #Acute on chronic diastolic heart failure: Patient with known history of diastolic heart failure now acutely decompensated with worsening DOE, LE edema and orthopnea. Likely triggered by underlying infection. Review of monitor showed sinus tach with PACs; no definitive Afib. Initially required BiPAP now weaned to [facility]. Appears grossly overloaded on examination with NYHA class III symptoms. -much improved UOP with increased dose of Lasix and Metolazone>>changed to Demadex 60mg  BID  -He continues to diurese well and put out 2.7L yesterday and is net neg 14.1L -weight is inaccurate today but he has lost over 25lbs since admit -SCr slightly increased at 1.14 today  -continue spiro 25mg  daily -will decrease Demadex to 40mg  BID given marked UOP daily and slightly bumped SCr -follow I&O's, daily weights and renal function -Low Na diet  #Known history of SVT #Possible Afib: Monitor with possible runs of Afib however very difficult to determine if true Afib vs sinus tach with PACs and runs of SVT. Previous monitor in 2020 with SVT. Asked patient to bring in home monitor that was reportedly detecting Afib. -currently monitor shows runs of nonsustained atrial tachycardia with occasional PACs and PVCs -continue Toprol XL 25mg  daily and titrate as needed for SVT and as BP tolerates -Continue telemetry -If true Afib captured on tele or ECG, would merit [facility]  #Nonobstructive CAD: -No evidence of ischemia and patient chest pain free.  -Minimal disease on cath in 2019 -Continue ASA  and statin  #HTN: -BP stable at 123/59mmHg -Continue spiro 25mg  daily and Toprol XL 25mg  daily  #HLD: -Continue lipitor 40mg  daily -LDL 58 this admit  #Morbid Obesity: -Consider ozempic vs wegovy as out-patient  #DMII: -Management per primary   #CKD: -Cr stable at 1.1 -continue to watch closely -Continue diuresis  #Cellulitis: #Leukocytosis: -On IV CTX per primary team    I have spent a total of 35 minutes with patient reviewing 2D echo , telemetry, EKGs, labs and examining patient as well as establishing an assessment and plan that was discussed with the patient.  > 50% of time was spent in direct patient care.    For questions or updates, please contact [place] Please consult [email or web address] for contact info under        Signed, Fransico Him, MD  10/22/2020, 10:16 AM

## 2020-10-22 NOTE — Progress Notes (Signed)
PROGRESS NOTE    Lawrence [name]  SMO:[id number] DOB: [birth year] DOA: 10/16/2020 PCP: Jenny Reichmann, PA-C (Inactive)    Brief Narrative:  65 year old with past medical history significant for hypertension, diabetes, hyperlipidemia, COPD, diastolic heart failure, venous stasis who presented via EMS complaining of shortness of breath.  Patient reported episode of shortness of breath for the last 2 or 3 days prior to admission worse at night.  He was evaluated by EMS and he was found to have oxygen saturation in the 70s.  He was placed on a normal breather mask and subsequently on BiPAP.  He received high-dose methylprednisolone on in route.  Evaluation in the ED chest x-ray show pulmonary edema, initial troponin was 14.  Patient admitted for heart failure exacerbation.   3/16-EEG nml limits. No seizures or epileptiform . 3/17-feeling better alittle. Mri done today  Consultants:   Cardiology, neurology  Procedures:   Antimicrobials:       Subjective: Less short of breath, no chest pain or dizziness  Objective: Vitals:   10/22/20 0400 10/22/20 0808 10/22/20 1127 10/22/20 1600  BP: 123/85 (!) 154/105 (!) 133/103   Pulse: 83 93 89 96  Resp: 17 18 17 15   Temp: 98.5 F (36.9 C) 98 F (36.7 C) 97.8 F (36.6 C)   TempSrc: Oral Oral Oral   SpO2: 90% 98% 94% 90%  Weight:  (!) 178.6 kg    Height:        Intake/Output Summary (Last 24 hours) at 10/22/2020 1727 Last data filed at 10/22/2020 1400 Gross per 24 hour  Intake 480 ml  Output 2450 ml  Net -1970 ml   Filed Weights   10/20/20 0527 10/21/20 0139 10/22/20 0808  Weight: (!) 175.2 kg 97.5 kg (!) 178.6 kg    Examination: Calm, nad Fine crackles at bases Regular s1/s2 no gallop Soft beningn+bs +edema and erythema of b/l LE shins. aaxox3 Mood and affect intact     Data Reviewed: I have personally reviewed following labs and imaging studies  CBC: Recent Labs  Lab 10/16/20 2343 10/16/20 2359  10/17/20 0347 10/18/20 0446 10/19/20 0333 10/20/20 0344 10/21/20 0433  WBC 19.3*  --  25.7* 20.1* 14.5* 12.5* 15.4*  NEUTROABS 17.5*  --   --   --   --   --   --   HGB 13.9   < > 14.1 12.5* 13.2 13.3 14.4  HCT 43.3   < > 43.2 37.4* 39.9 39.8 42.7  MCV 89.1  --  88.2 87.6 87.7 87.1 86.1  PLT 288  --  281 276 293 312 351   < > = values in this interval not displayed.   Basic Metabolic Panel: Recent Labs  Lab 10/17/20 0347 10/17/20 0701 10/17/20 1625 10/18/20 0446 10/19/20 0333 10/20/20 0344 10/21/20 0433 10/22/20 0345  NA  --  130*  --  127* 131* 131* 129* 128*  K  --  3.3*  --  3.7 3.7 4.0 4.3 3.9  CL  --  88*  --  87* 88* 87* 90* 89*  CO2  --  30  --  32 31 35* 27 28  GLUCOSE  --  341*   < > 286* 233* 197* 234* 189*  BUN  --  18  --  29* 32* 24* 22 25*  CREATININE 1.01 1.01  --  0.94 1.17 1.14 1.01 1.14  CALCIUM  --  9.0  --  8.5* 8.8* 9.4 9.2 9.1  MG 1.8 1.8  --   --  2.1  --  2.1  --    < > = values in this interval not displayed.   GFR: Estimated Creatinine Clearance: 113.3 mL/min (by C-G formula based on SCr of 1.14 mg/dL). Liver Function Tests: Recent Labs  Lab 10/16/20 2343  AST 25  ALT 26  ALKPHOS 77  BILITOT 1.0  PROT 7.4  ALBUMIN 3.7   No results for input(s): LIPASE, AMYLASE in the last 168 hours. No results for input(s): AMMONIA in the last 168 hours. Coagulation Profile: No results for input(s): INR, PROTIME in the last 168 hours. Cardiac Enzymes: No results for input(s): CKTOTAL, CKMB, CKMBINDEX, TROPONINI in the last 168 hours. BNP (last 3 results) No results for input(s): PROBNP in the last 8760 hours. HbA1C: No results for input(s): HGBA1C in the last 72 hours. CBG: Recent Labs  Lab 10/21/20 1538 10/21/20 2108 10/22/20 0631 10/22/20 1139 10/22/20 1601  GLUCAP 280* 323* 189* 290* 311*   Lipid Profile: No results for input(s): CHOL, HDL, LDLCALC, TRIG, CHOLHDL, LDLDIRECT in the last 72 hours. Thyroid Function Tests: No results for  input(s): TSH, T4TOTAL, FREET4, T3FREE, THYROIDAB in the last 72 hours. Anemia Panel: No results for input(s): VITAMINB12, FOLATE, FERRITIN, TIBC, IRON, RETICCTPCT in the last 72 hours. Sepsis Labs: No results for input(s): PROCALCITON, LATICACIDVEN in the last 168 hours.  Recent Results (from the past 240 hour(s))  SARS CORONAVIRUS 2 (TAT 6-24 HRS) Nasopharyngeal Nasopharyngeal Swab     Status: None   Collection Time: 10/16/20 11:42 PM   Specimen: Nasopharyngeal Swab  Result Value Ref Range Status   SARS Coronavirus 2 NEGATIVE NEGATIVE Final    Comment: (NOTE) SARS-CoV-2 target nucleic acids are NOT DETECTED.  The SARS-CoV-2 RNA is generally detectable in upper and lower respiratory specimens during the acute phase of infection. Negative results do not preclude SARS-CoV-2 infection, do not rule out co-infections with other pathogens, and should not be used as the sole basis for treatment or other patient management decisions. Negative results must be combined with clinical observations, patient history, and epidemiological information. The expected result is Negative.  Fact Sheet for Patients: SugarRoll.be  Fact Sheet for Healthcare Providers: https://www.woods-mathews.com/  This test is not yet approved or cleared by the [place] FDA and  has been authorized for detection and/or diagnosis of SARS-CoV-2 by FDA under an Emergency Use Authorization (EUA). This EUA will remain  in effect (meaning this test can be used) for the duration of the COVID-19 declaration under Se ction 564(b)(1) of the Act, 21 U.S.C. section 360bbb-3(b)(1), unless the authorization is terminated or revoked sooner.  Performed at [facility], [address], [place], [place] [address]          Radiology Studies: MR BRAIN WO CONTRAST  Result Date: 10/22/2020 CLINICAL DATA:  Seizure, nontraumatic. EXAM: MRI HEAD WITHOUT CONTRAST TECHNIQUE:  Multiplanar, multiecho pulse sequences of the brain and surrounding structures were obtained without intravenous contrast. COMPARISON:  MRI of the brain October 20, 2020 FINDINGS: The study is partially degraded by motion. Brain: No acute infarction, hemorrhage, hydrocephalus, extra-axial collection or mass lesion. Vascular: Normal flow voids. Skull and upper cervical spine: Normal marrow signal. Sinuses/Orbits: Negative. IMPRESSION: No acute intracranial abnormality identified. Electronically Signed   By: Pedro Earls M.D.   On: 10/22/2020 12:20   MR BRAIN WO CONTRAST  Result Date: 10/21/2020 CLINICAL DATA:  Initial evaluation for acute seizure. EXAM: MRI HEAD WITHOUT CONTRAST TECHNIQUE: Multiplanar, multiecho pulse sequences of the brain and surrounding  structures were obtained without intravenous contrast. COMPARISON:  None. FINDINGS: Brain: Examination severely limited by extensive motion artifact and patient's inability to tolerate the full length of the study. Cerebral volume within normal limits for age. No definite focal parenchymal signal abnormality. No abnormal foci of restricted diffusion to suggest acute or subacute ischemia. Gray-white matter differentiation maintained. No encephalomalacia to suggest chronic cortical infarction. No definite evidence for acute or chronic intracranial hemorrhage. No visible mass lesion, mass effect or midline shift. No hydrocephalus or extra-axial fluid collection. Pituitary gland suprasellar region grossly normal. Midline structures intact. Vascular: Major intracranial vascular flow voids are maintained. Skull and upper cervical spine: Craniocervical junction grossly within normal limits. Bone marrow signal intensity grossly normal. No visible scalp soft tissue abnormality. Sinuses/Orbits: Globes and orbital soft tissues within normal limits. Paranasal sinuses are largely clear. No significant mastoid effusion. Other: None. IMPRESSION: 1. Technically  limited exam due to extensive motion artifact and patient's inability to tolerate the full length of the study. 2. Grossly negative brain MRI. No acute intracranial abnormality identified. Electronically Signed   By: Jeannine Boga M.D.   On: 10/21/2020 02:20   EEG adult  Result Date: 10/21/2020 Lora Havens, MD     10/21/2020  9:11 AM Patient Name: Lawrence [name] MRN: [id number] Epilepsy Attending: Lora Havens Referring Physician/Provider: Dr. Linus Mako Date: 10/20/2020 Duration: 45 mins Patient history: 65 yo man w/ no previous seizure history p/w 2 witnessed events in 4 days concerning for partial seizure with impaired awareness.  EEG to evaluate for seizure. Level of alertness: Awake AEDs during EEG study: Keppra, pregabalin, temazepam Technical aspects: This EEG study was done with scalp electrodes positioned according to the 10-20 International system of electrode placement. Electrical activity was acquired at a sampling rate of 500Hz  and reviewed with a high frequency filter of 70Hz  and a low frequency filter of 1Hz . EEG data were recorded continuously and digitally stored. Description: The posterior dominant rhythm consists of 8 Hz activity of moderate voltage (25-35 uV) seen predominantly in posterior head regions, symmetric and reactive to eye opening and eye closing. Physiologic photic driving was not seen during photic stimulation.  Hyperventilation was not performed.   IMPRESSION: This study is within normal limits. No seizures or epileptiform discharges were seen throughout the recording. Priyanka Barbra Sarks        Scheduled Meds: . ARIPiprazole  5 mg Oral QHS  . aspirin EC  81 mg Oral Daily  . atorvastatin  40 mg Oral Daily  . enoxaparin (LOVENOX) injection  90 mg Subcutaneous Q24H  . fluticasone  2 spray Each Nare Daily  . insulin aspart  0-15 Units Subcutaneous TID WC  . insulin aspart  0-5 Units Subcutaneous QHS  . insulin aspart  8 Units Subcutaneous TID WC  .  insulin glargine  30 Units Subcutaneous Daily  . metoprolol succinate  25 mg Oral Daily  . potassium chloride  40 mEq Oral Daily  . pregabalin  100 mg Oral TID  . sodium chloride flush  3 mL Intravenous Q12H  . spironolactone  25 mg Oral Daily  . sulfamethoxazole-trimethoprim  1 tablet Oral Q12H  . temazepam  15 mg Oral QHS  . torsemide  40 mg Oral BID  . traZODone  200 mg Oral QHS  . venlafaxine XR  225 mg Oral Daily   Continuous Infusions: . sodium chloride Stopped (10/18/20 1255)  . sodium chloride Stopped (10/19/20 2055)  . cefTRIAXone (ROCEPHIN)  IV 1 g (10/22/20 1214)  .  levETIRAcetam 1,000 mg (10/22/20 1631)    Assessment & Plan:   Principal Problem:   Acute on chronic diastolic CHF (congestive heart failure) (HCC) Active Problems:   Morbid obesity ([place])   Acute respiratory failure with hypoxia (HCC)   Diabetes mellitus type 2 in obese (HCC)   QT prolongation   Essential hypertension   Hypokalemia   Seizure ([place])   Coronary artery disease due to lipid rich plaque   SVT (supraventricular tachycardia) (HCC)   1-Acute Hypoxic Respiratory Failure: Secondary to Acute Diastolic Heart Failure Exacerbation -Patient presented hypoxic oxygen saturation 70% initially required BiPAP and nonrebreather mask. Lasix and Metolazone>>changed to Demadex 60mg  BID  3/17-clinically improving  Down at least 25 pounds from admission  Good urine output,  Cardiology following Continue spironolactone 25 mg daily Demadex decreased to 40 mg twice daily given slight bump in SCr I's and O's Low-sodium diet      2-Partial Seizure -Patient develops right arm tremor/jerking movement. He is alert, answer questions. Per wife patient had this tremors on admission, that was why she brought him here on Friday, he had episode of urinary incontinence as well. He hasn't had tremors since admission.  -He has been off Effexor, since admission, but per wife he was having this tremors at home and he  was taking his effexor.  3/17-EEG negative  Neurology following  MRI of brain negative for any acute changes  Continue Demadex to Pam  Continue pregabalin  Continue Keppra      3-Diabetes type 2 hyperglycemia: A1c 9.2 3/17-BG elevated Increase Lantus to 35 units daily Increase NovoLog to 11 3 times daily with meals     4-Hypertension: BP low continue to hold ACEI and beta-blockers   5-Hypokalemia: Hyponatremia-Na down to 128. Likely 2/2 diuresis Torsemide decreased as above Continue to monitor    6-prolonged QT: PVC: received mg, k replaced.   7-Morbid obesity: diet education.  -LE Cellulitis; on chronic venous stasis.  Patient presents with worsening redness; started Ceftriaxone, and Bactrim.  WBC trending down. Suspect leukocytosis is also elevated secondary to steroids.  Wound care consulted, appreciate assistance.  Doppler: negative for DVT>  Continue on abx  -8SVT/ ;[facility] Telemetry Cardiology consulted.  HR elevated, BP improved. Per cardiology continue to hold BB  9-COPD; Resume Inhaler.  -Hypotension; BP low; hold lisinopril.  Depression;  Resume effexor, abilify/ Temazepam.  Continue with lyrica.    Estimated body mass index is 49.6 kg/m as calculated from the following:   Height as of this encounter: 6\' 2"  (1.88 m).   Weight as of this encounter: 175.2 kg.     DVT prophylaxis: lovenox Code Status:DNR Family Communication: none at bedside Status is: Inpatient  Remains inpatient appropriate because:Inpatient level of care appropriate due to severity of illness   Dispo: The patient is from: Home              Anticipated d/c is to: Home              Patient currently is not medically stable to d/c.   Difficult to place patient No            LOS: 5 days   Time spent: 35 min with >50% on co    Nolberto Hanlon, MD [facility] Pager [phone]  If 7PM-7AM, please contact night-coverage 10/22/2020, 5:27 PM

## 2020-10-22 NOTE — Progress Notes (Signed)
Pt is experiencing weakness in legs. Used bed high to assist with standing gait belt and walker with 2-3 assist. Standing weight completed for accuracy.

## 2020-10-22 NOTE — Progress Notes (Signed)
F/up MRI, spoke to them and they'll get pt early am. Pt updated.

## 2020-10-22 NOTE — Progress Notes (Signed)
Attempted Mri w/wo contrast on Lawrence [name]. The exam had 29min's left before I was to bring the pt out and give contrast and pt began asking to end exam. I was able to talk him into finishing 55min of pre contrast scanning but I was not able to get him to continue any more past that point.

## 2020-10-22 NOTE — Progress Notes (Signed)
Neurology Progress Note  S: No further events c/f clinical seizure. Back to mental status baseline today with normal neurologic exam.   Data:  Repeat MRI brain wo contrast 3/17 showed no sig abnl   EEG WNL and discontinued.   Exam  Vitals:   10/22/20 1600 10/22/20 1950  BP:  116/90  Pulse: 96 93  Resp: 15 16  Temp:  97.8 F (36.6 C)  SpO2: 90% 92%   MS: A&Ox4 Speech: fluent, no dysarthria or aphasia. Naming and repetition intact CN: PERRL, EOMI, sensation intact, strength intact, hearing intact to voice, tongue protrudes midline Motor: Normal bulk and tone, no rigidity, bradykinesia, or tremor. 5/5 strength x4 extrem Sensory: SILT Reflexes: 2+ and symmetric throughout with downgoing toes Coordination: intact FNF bilat Gait: deferred  A/P: p/w 2 witnessed events in 4 days concerning for partial seizure with impaired awareness. Restarted home pregabalin, temazepam + added LEV. MRI wo contrast showed no sig abnl. Not performed wwo contrast 2/2 AKI this admission.   - No further inpatient neurologic w/u recommended - Continue current doses of home medications temazepam 15mg  qhs and pregabalin 100mg  tid at discharge - Continue keppra 1000mg  bid 2/2 ongoing tx for infection (cellulitis) - I will place amb referral to neurology for outpatient f/u, tapering keppra may be considered as o/p pending resolution of infection if no further clinical events  Neurology to sign off, please re-engage if new neurologic concerns arise.  Su Monks, MD [facility] [phone]  If 7pm- 7am, please page neurology on call as listed in [place].

## 2020-10-22 NOTE — Progress Notes (Signed)
PT Cancellation Note  Patient Details Name: Lawrence [name] MRN: [id number] DOB: [birth year]   Cancelled Treatment:    Reason Eval/Treat Not Completed: Patient at procedure or test/unavailable (MRI). Will follow-up for PT treatment as schedule permits.  Mabeline Caras, PT, DPT Acute Rehabilitation Services  Pager [phone] Office [place] 10/22/2020, 10:57 AM

## 2020-10-22 NOTE — Progress Notes (Signed)
Bed weight was not obtain D/t bed keeps on showing zero. Pt was so asleep. unsafe for Pt to try to stand. Pass on day shift. Try to get standing Wt instead.

## 2020-10-22 NOTE — Progress Notes (Signed)
Physical Therapy Treatment Patient Details Name: Lawrence [name] MRN: [id number] DOB: Jul 19, 1956 Today's Date: 10/22/2020    History of Present Illness Patient is a 65 y/o male admitted 10/17/20 with weight gain and SOB. CXR with pulmonary edema. Workup for acute respiratory failure with hypoxia secondary to acute on chronic CHF; also with SVT vs A-fib. Pt with periods of rhythmic jerking and decreased responsiveness; concern for partial seizure. EEG 3/16 within normal limits. Brain MRI 3/17 with no acute abnormality. PMH includes diastolic CHF, COPD, CAD, DM2, PTSD, anxiey, HTN, venous insufficiency.   PT Comments    Pt making good progress with mobility session. Tolerated multiple bouts of ambulation with rollator and min guard for safety (20 ft + 136 ft + 20 ft); pt remains limited by generalized weakness, pain and decreased activity tolerance, requiring frequent seated rest breaks secondary to DOE and fatigue. Pt motivated to participate and return home. Will continue to follow acutely.  SpO2 97-100% on RA; maintaining 92-94% on 1L O2 [place] HR 105-147    Follow Up Recommendations  Home health PT;Supervision for mobility/OOB     Equipment Recommendations  None recommended by PT    Recommendations for Other Services       Precautions / Restrictions Precautions Precautions: Fall;Other (comment) Precaution Comments: Watch SpO2, HR Restrictions Weight Bearing Restrictions: No    Mobility  Bed Mobility               General bed mobility comments: Received sitting in recliner    Transfers Overall transfer level: Needs assistance Equipment used: 4-wheeled walker Transfers: Sit to/from Stand Sit to Stand: Min guard         General transfer comment: Multiple sit<>stands from recliner and BSC (2x as rest break seat) to rollator with min guard for balance, heavy reliance on UE support and momentum to power into standing; good awareness to lock rollator brakes without cues;  good eccentric control into sitting  Ambulation/Gait Ambulation/Gait assistance: Min guard Gait Distance (Feet): 20 Feet (+ 136' + 20') Assistive device: 4-wheeled walker Gait Pattern/deviations: Step-through pattern;Decreased stride length;Trunk flexed;Wide base of support;Decreased dorsiflexion - right;Antalgic Gait velocity: Decreased   General Gait Details: Slow, mildly unsteady gait with rollator and min guard for balance, heavy reliance on BUE support; pt reports improved ability to mobilize using rollator; pt ambulating 20' + 136' + 20' with prolonged seated rest breaks between gait distance due to fatigue and SOB; cues to self-monitor activity tolerance. SpO2 97% on 3L O2, maintaining >/92% on 1L O2; HR 105-147   Stairs             Wheelchair Mobility    Modified Rankin (Stroke Patients Only)       Balance Overall balance assessment: Needs assistance Sitting-balance support: Feet supported;Single extremity supported Sitting balance-Leahy Scale: Good     Standing balance support: During functional activity Standing balance-Leahy Scale: Poor Standing balance comment: Reliant on UE support                            Cognition Arousal/Alertness: Awake/alert Behavior During Therapy: WFL for tasks assessed/performed Overall Cognitive Status: Within Functional Limits for tasks assessed                                        Exercises      General Comments General comments (skin integrity, edema, etc.):  SpO2 97-100% on 3L O2 [place], maintaining 92-94% on 1L O2 [place]; titrated down to 2L O2 resting at end of session (had been on 3L)      Pertinent Vitals/Pain Pain Assessment: Faces Faces Pain Scale: Hurts little more Pain Location: BLEs (RLE > LLE) Pain Descriptors / Indicators: Discomfort Pain Intervention(s): Monitored during session    Home Living                      Prior Function            PT Goals (current goals can  now be found in the care plan section) Progress towards PT goals: Progressing toward goals    Frequency    Min 3X/week      PT Plan Current plan remains appropriate    Co-evaluation              AM-PAC PT "6 Clicks" Mobility   Outcome Measure  Help needed turning from your back to your side while in a flat bed without using bedrails?: A Little Help needed moving from lying on your back to sitting on the side of a flat bed without using bedrails?: A Little Help needed moving to and from a bed to a chair (including a wheelchair)?: A Little Help needed standing up from a chair using your arms (e.g., wheelchair or bedside chair)?: A Little Help needed to walk in hospital room?: A Little Help needed climbing 3-5 steps with a railing? : A Lot 6 Click Score: 17    End of Session Equipment Utilized During Treatment: Oxygen Activity Tolerance: Patient tolerated treatment well Patient left: in chair;with call bell/phone within reach Nurse Communication: Mobility status PT Visit Diagnosis: Pain;Muscle weakness (generalized) (M62.81);Unsteadiness on feet (R26.81);Other abnormalities of gait and mobility (R26.89) Pain - Right/Left: Right Pain - part of body: Knee     Time: 2952-8413 PT Time Calculation (min) (ACUTE ONLY): 32 min  Charges:  $Gait Training: 8-22 mins $Therapeutic Exercise: 8-22 mins                    Mabeline Caras, PT, DPT Acute Rehabilitation Services  Pager [phone] Office [place] 10/22/2020, 3:52 PM

## 2020-10-23 DIAGNOSIS — I2510 Atherosclerotic heart disease of native coronary artery without angina pectoris: Secondary | ICD-10-CM | POA: Diagnosis not present

## 2020-10-23 DIAGNOSIS — I10 Essential (primary) hypertension: Secondary | ICD-10-CM | POA: Diagnosis not present

## 2020-10-23 DIAGNOSIS — I5033 Acute on chronic diastolic (congestive) heart failure: Secondary | ICD-10-CM | POA: Diagnosis not present

## 2020-10-23 LAB — BASIC METABOLIC PANEL
Anion gap: 10 (ref 5–15)
BUN: 29 mg/dL — ABNORMAL HIGH (ref 8–23)
CO2: 31 mmol/L (ref 22–32)
Calcium: 9 mg/dL (ref 8.9–10.3)
Chloride: 88 mmol/L — ABNORMAL LOW (ref 98–111)
Creatinine, Ser: 1.27 mg/dL — ABNORMAL HIGH (ref 0.61–1.24)
GFR, Estimated: >60 mL/min (ref >60)
Glucose, Bld: 172 mg/dL — ABNORMAL HIGH (ref 70–99)
Potassium: 4 mmol/L (ref 3.5–5.1)
Sodium: 129 mmol/L — ABNORMAL LOW (ref 135–145)

## 2020-10-23 LAB — CBC
HCT: 44.5 % (ref 39.0–52.0)
Hemoglobin: 14.4 g/dL (ref 13.0–17.0)
MCH: 28.5 pg (ref 26.0–34.0)
MCHC: 32.4 g/dL (ref 30.0–36.0)
MCV: 87.9 fL (ref 80.0–100.0)
Platelets: 403 10*3/uL — ABNORMAL HIGH (ref 150–400)
RBC: 5.06 MIL/uL (ref 4.22–5.81)
RDW: 14.6 % (ref 11.5–15.5)
WBC: 17.6 10*3/uL — ABNORMAL HIGH (ref 4.0–10.5)
nRBC: 0 % (ref 0.0–0.2)

## 2020-10-23 LAB — CREATININE, SERUM
Creatinine, Ser: 1.29 mg/dL — ABNORMAL HIGH (ref 0.61–1.24)
GFR, Estimated: >60 mL/min (ref >60)

## 2020-10-23 LAB — GLUCOSE, CAPILLARY
Glucose-Capillary: 192 mg/dL — ABNORMAL HIGH (ref 70–99)
Glucose-Capillary: 216 mg/dL — ABNORMAL HIGH (ref 70–99)
Glucose-Capillary: 217 mg/dL — ABNORMAL HIGH (ref 70–99)
Glucose-Capillary: 191 mg/dL — ABNORMAL HIGH (ref 70–99)

## 2020-10-23 MED ORDER — INSULIN ASPART 100 UNIT/ML ~~LOC~~ SOLN
15.0000 [IU] | Freq: Three times a day (TID) | SUBCUTANEOUS | Status: DC
  Administered 2020-10-23 – 2020-10-26 (×9): 15 [IU] via SUBCUTANEOUS

## 2020-10-23 MED ORDER — TORSEMIDE 20 MG PO TABS
40.0000 mg | ORAL_TABLET | Freq: Every morning | ORAL | Status: DC
Start: 2020-10-23 — End: 2020-10-24
  Administered 2020-10-23 – 2020-10-24 (×2): 40 mg via ORAL
  Filled 2020-10-23 (×2): qty 2

## 2020-10-23 MED ORDER — ENOXAPARIN SODIUM 40 MG/0.4ML ~~LOC~~ SOLN
40.0000 mg | SUBCUTANEOUS | Status: DC
  Administered 2020-10-24 – 2020-10-25 (×2): 40 mg via SUBCUTANEOUS
  Filled 2020-10-23 (×2): qty 0.4

## 2020-10-23 MED ORDER — INSULIN GLARGINE 100 UNIT/ML ~~LOC~~ SOLN
40.0000 [IU] | Freq: Every day | SUBCUTANEOUS | Status: DC
  Administered 2020-10-24 – 2020-10-26 (×3): 40 [IU] via SUBCUTANEOUS
  Filled 2020-10-23 (×3): qty 0.4

## 2020-10-23 MED ORDER — TORSEMIDE 20 MG PO TABS
20.0000 mg | ORAL_TABLET | Freq: Every evening | ORAL | Status: DC
  Administered 2020-10-23: 20 mg via ORAL
  Filled 2020-10-23: qty 1

## 2020-10-23 NOTE — Progress Notes (Addendum)
Progress Note  Patient Name: Lawrence [name] Date of Encounter: 10/23/2020  [facility] Cardiologist: Fransico Him, MD   Subjective   Denies any chest pain or SOB.  LE edema much improved  He put out 1.25L yesterday and is net neg 15L  Inpatient Medications    Scheduled Meds: . ARIPiprazole  5 mg Oral QHS  . aspirin EC  81 mg Oral Daily  . atorvastatin  40 mg Oral Daily  . enoxaparin (LOVENOX) injection  90 mg Subcutaneous Q24H  . fluticasone  2 spray Each Nare Daily  . insulin aspart  0-15 Units Subcutaneous TID WC  . insulin aspart  0-5 Units Subcutaneous QHS  . insulin aspart  11 Units Subcutaneous TID WC  . insulin glargine  35 Units Subcutaneous Daily  . metoprolol succinate  25 mg Oral Daily  . potassium chloride  40 mEq Oral Daily  . pregabalin  100 mg Oral TID  . sodium chloride flush  3 mL Intravenous Q12H  . spironolactone  25 mg Oral Daily  . sulfamethoxazole-trimethoprim  1 tablet Oral Q12H  . temazepam  15 mg Oral QHS  . torsemide  40 mg Oral BID  . traZODone  200 mg Oral QHS  . venlafaxine XR  225 mg Oral Daily   Continuous Infusions: . sodium chloride Stopped (10/18/20 1255)  . sodium chloride Stopped (10/19/20 2055)  . cefTRIAXone (ROCEPHIN)  IV 1 g (10/22/20 1214)  . levETIRAcetam 1,000 mg (10/23/20 0626)   PRN Meds: sodium chloride, sodium chloride, acetaminophen, albuterol, LORazepam, oxyCODONE, sodium chloride flush   Vital Signs    Vitals:   10/22/20 2045 10/23/20 0020 10/23/20 0445 10/23/20 0730  BP:  (!) 125/95 132/87 126/83  Pulse: 95 82 85 86  Resp: 16 16 14 19   Temp:  98.6 F (37 C) 98.4 F (36.9 C) 98.1 F (36.7 C)  TempSrc:  Oral Oral Oral  SpO2: 93% 90% 95% 94%  Weight:      Height:        Intake/Output Summary (Last 24 hours) at 10/23/2020 0756 Last data filed at 10/23/2020 7209 Gross per 24 hour  Intake 480 ml  Output 1250 ml  Net -770 ml   Last 3 Weights 10/22/2020 10/21/2020 10/20/2020  Weight (lbs) 393 lb 11.9 oz  215 lb 386 lb 4.8 oz  Weight (kg) 178.6 kg 97.523 kg 175.225 kg      Telemetry  NSR- Personally Reviewed  ECG    No new EKG to review - Personally Reviewed  Physical Exam   GEN: Well nourished, well developed in no acute distress, morbidly obese HEENT: Normal NECK: No JVD; No carotid bruits LYMPHATICS: No lymphadenopathy CARDIAC:RRR, no murmurs, rubs, gallops RESPIRATORY:  Clear to auscultation without rales, wheezing or rhonchi  ABDOMEN: Soft, non-tender, non-distended MUSCULOSKELETAL: chronic venous stasis changes; No deformity  SKIN: Warm and dry NEUROLOGIC:  Alert and oriented x 3 PSYCHIATRIC:  Normal affect    Labs    High Sensitivity Troponin:   Recent Labs  Lab 10/16/20 2343 10/17/20 0143 10/17/20 0628 10/17/20 0701  TROPONINIHS 14 18* 14 12      Chemistry Recent Labs  Lab 10/16/20 2343 10/16/20 2359 10/21/20 0433 10/22/20 0345 10/23/20 0423  NA 132*   < > 129* 128* 129*  K 3.3*   < > 4.3 3.9 4.0  CL 91*   < > 90* 89* 88*  CO2 30   < > 27 28 31   GLUCOSE 253*   < > 234*  189* 172*  BUN 17   < > 22 25* 29*  CREATININE 1.08   < > 1.01 1.14 1.27*  CALCIUM 9.3   < > 9.2 9.1 9.0  PROT 7.4  --   --   --   --   ALBUMIN 3.7  --   --   --   --   AST 25  --   --   --   --   ALT 26  --   --   --   --   ALKPHOS 77  --   --   --   --   BILITOT 1.0  --   --   --   --   GFRNONAA >60   < > >60 >60 >60  ANIONGAP 11   < > 12 11 10    < > = values in this interval not displayed.     Hematology Recent Labs  Lab 10/19/20 0333 10/20/20 0344 10/21/20 0433  WBC 14.5* 12.5* 15.4*  RBC 4.55 4.57 4.96  HGB 13.2 13.3 14.4  HCT 39.9 39.8 42.7  MCV 87.7 87.1 86.1  MCH 29.0 29.1 29.0  MCHC 33.1 33.4 33.7  RDW 14.9 14.8 14.6  PLT 293 312 351    BNP Recent Labs  Lab 10/16/20 2343 10/22/20 0345  BNP 61.4 36.3     DDimer No results for input(s): DDIMER in the last 168 hours.   Radiology    MR BRAIN WO CONTRAST  Result Date: 10/22/2020 CLINICAL DATA:   Seizure, nontraumatic. EXAM: MRI HEAD WITHOUT CONTRAST TECHNIQUE: Multiplanar, multiecho pulse sequences of the brain and surrounding structures were obtained without intravenous contrast. COMPARISON:  MRI of the brain October 20, 2020 FINDINGS: The study is partially degraded by motion. Brain: No acute infarction, hemorrhage, hydrocephalus, extra-axial collection or mass lesion. Vascular: Normal flow voids. Skull and upper cervical spine: Normal marrow signal. Sinuses/Orbits: Negative. IMPRESSION: No acute intracranial abnormality identified. Electronically Signed   By: Pedro Earls M.D.   On: 10/22/2020 12:20   EEG adult  Result Date: 10/21/2020 Lora Havens, MD     10/21/2020  9:11 AM Patient Name: Lawrence [name] MRN: [id number] Epilepsy Attending: Lora Havens Referring Physician/Provider: Dr. Linus Mako Date: 10/20/2020 Duration: 45 mins Patient history: 65 yo man w/ no previous seizure history p/w 2 witnessed events in 4 days concerning for partial seizure with impaired awareness.  EEG to evaluate for seizure. Level of alertness: Awake AEDs during EEG study: Keppra, pregabalin, temazepam Technical aspects: This EEG study was done with scalp electrodes positioned according to the 10-20 International system of electrode placement. Electrical activity was acquired at a sampling rate of 500Hz  and reviewed with a high frequency filter of 70Hz  and a low frequency filter of 1Hz . EEG data were recorded continuously and digitally stored. Description: The posterior dominant rhythm consists of 8 Hz activity of moderate voltage (25-35 uV) seen predominantly in posterior head regions, symmetric and reactive to eye opening and eye closing. Physiologic photic driving was not seen during photic stimulation.  Hyperventilation was not performed.   IMPRESSION: This study is within normal limits. No seizures or epileptiform discharges were seen throughout the recording. Lora Havens    Cardiac  Studies   TTE 03/08/19: IMPRESSIONS  1. The left ventricle has normal systolic function, with an ejection  fraction of 55-60%. The cavity size was normal. There is moderate  concentric left ventricular hypertrophy. Left ventricular diastolic  Doppler parameters are consistent with  pseudonormalization. No evidence of left ventricular regional wall motion  abnormalities.  2. The right ventricle has normal systolic function. The cavity was  normal. There is no increase in right ventricular wall thickness.  3. Left atrial size was mildly dilated.  4. Right atrial size was mildly dilated.  5. Mild thickening of the mitral valve leaflet. Mild calcification of the  mitral valve leaflet. There is moderate to severe mitral annular  calcification present.  6. The aortic valve is tricuspid. Moderate thickening of the aortic  valve. Moderate calcification of the aortic valve. Mild stenosis of the  aortic valve.  7. The aorta is normal in size and structure.  8. The aortic root is normal in size and structure.   Cath 01/17/18:  There is hyperdynamic left ventricular systolic function.  LV end diastolic pressure is normal.  The left ventricular ejection fraction is greater than 65% by visual estimate.  There is no mitral valve regurgitation.  There is no aortic valve stenosis.  Prox LAD to Mid LAD lesion is 40% stenosed.  Prox Cx to Mid Cx lesion is 20% stenosed.  Ost 2nd Mrg lesion is 20% stenosed.  Angiographically minimal coronary disease with ectatic RCA and circumflex. Normal LVEDP and normal LV function/hyperdynamic.   Plan: Return to nursing unit for post catheterization care & TR band removal. Would be okay for discharge from cardiac standpoint after bedrest post TR band removal.  Defer further plans to primary team and consulting cardiologist.  Zio 03/12/19: Max 200 bpm 09:28pm, 07/28 Min 66 bpm 08:09am, 07/28 Avg 92 bpm Multiple episodes of SVT Fastest  episode at 200 BPM for 14 beats Longest episode at 15 minutes with average HR of 137 bpm Symptoms of chest pain associated with sinus rhythm Artifact makes interpretation difficult   Patient Profile     65 y.o. male with a hx of nonobstructive CAD, HTN, HLD, diastolic HF, OSA on BiPAP, morbid obesity, depression and anxiety who presented with worsening SOB and LE edema found to have acute on chronic HF exacerbation for which Cardiology has been consulted.  Assessment & Plan    #Acute on chronic diastolic heart failure: Patient with known history of diastolic heart failure now acutely decompensated with worsening DOE, LE edema and orthopnea. Likely triggered by underlying infection. Review of monitor showed sinus tach with PACs; no definitive Afib. Initially required BiPAP now weaned to [facility]. Appears grossly overloaded on examination with NYHA class III symptoms. -much improved UOP with increased dose of Lasix and Metolazone>>changed to Demadex 60mg  BID  -He put out 1.25L yesterday and is net neg 15L -weight is inaccurate today but he has lost over 25lbs since admit -SCr slightly increased at 1.27 -continue spiro 25mg  daily -decrease Demadex to 40mg  qam and 20mg  qpm for now since SCr bumped -continue 1500cc fluid restriction -follow I&O's, daily weights and renal function -Low Na diet  #Known history of SVT #Possible Afib: Monitor with possible runs of Afib however very difficult to determine if true Afib vs sinus tach with PACs and runs of SVT. Previous monitor in 2020 with SVT. Asked patient to bring in home monitor that was reportedly detecting Afib. -SVT has significantly improved with addition of BB -continue Toprol XL 25mg  daily and titrate as needed for SVT and as BP tolerates -Continue telemetry -If true Afib captured on tele or ECG, would merit [facility]  #Nonobstructive CAD: -No evidence of ischemia and patient chest pain free.  -Minimal disease on cath in 2019 -Continue ASA and  statin  #HTN: -BP controlled at 126/68mmHg this am -Continue spiro 25mg  daily and Toprol XL 25mg  daily  #HLD: -Continue lipitor 40mg  daily -LDL 58 this admit  #Morbid Obesity: -Consider ozempic vs wegovy as out-patient  #DMII: -Management per primary   #CKD: -Cr bumpded today to 1.27 - decreasing dose of demadex -continue to watch closely -Continue diuresis  #Cellulitis: #Leukocytosis: -On IV CTX per primary team    I have spent a total of 35 minutes with patient reviewing 2D echo , telemetry, EKGs, labs and examining patient as well as establishing an assessment and plan that was discussed with the patient.  > 50% of time was spent in direct patient care.    For questions or updates, please contact [place] Please consult [email or web address] for contact info under        Signed, Fransico Him, MD  10/23/2020, 7:56 AM

## 2020-10-23 NOTE — Progress Notes (Signed)
Physical Therapy Treatment Patient Details Name: Lawrence [name] MRN: [id number] DOB: 01/19/1956 Today's Date: 10/23/2020    History of Present Illness Patient is a 65 y/o male admitted 10/17/20 with weight gain and SOB. CXR with pulmonary edema. Workup for acute respiratory failure with hypoxia secondary to acute on chronic CHF; also with SVT vs A-fib. Pt with periods of rhythmic jerking and decreased responsiveness; concern for partial seizure. EEG 3/16 within normal limits. Brain MRI 3/17 with no acute abnormality. PMH includes diastolic CHF, COPD, CAD, DM2, PTSD, anxiey, HTN, venous insufficiency.    PT Comments    Pt was seen for mobility on RW with room air to make the trip, total of 45' with no drop below 92% sats.  Pt is careful, with minimal conversation and using safe techniques for control of walker.  Follow along with pt to work on strength and balance, standing endurance as tolerated.  Unsure of his dc plan but will continue to work toward the goal of getting directly home from [place].  Monitor sats as pt is on room air at times.     Follow Up Recommendations  Home health PT;Supervision for mobility/OOB     Equipment Recommendations  None recommended by PT    Recommendations for Other Services       Precautions / Restrictions Precautions Precautions: Fall;Other (comment) Precaution Comments: Watch SpO2, HR Restrictions Weight Bearing Restrictions: No    Mobility  Bed Mobility               General bed mobility comments: up in chair in room when PT arrived    Transfers Overall transfer level: Needs assistance   Transfers: Sit to/from Stand Sit to Stand: Mod assist         General transfer comment: several chair stands before walking  Ambulation/Gait                 Stairs             Wheelchair Mobility    Modified Rankin (Stroke Patients Only)       Balance Overall balance assessment: Needs assistance Sitting-balance support:  Feet supported Sitting balance-Leahy Scale: Good     Standing balance support: Bilateral upper extremity supported;During functional activity Standing balance-Leahy Scale: Poor                              Cognition Arousal/Alertness: Awake/alert Behavior During Therapy: WFL for tasks assessed/performed Overall Cognitive Status: Within Functional Limits for tasks assessed                                        Exercises      General Comments General comments (skin integrity, edema, etc.): pt was seen at chair and walked in his room but also reapplied his heart monitor leads mult times.      Pertinent Vitals/Pain Pain Assessment: No/denies pain Pain Intervention(s): Monitored during session    Home Living                      Prior Function            PT Goals (current goals can now be found in the care plan section) Acute Rehab PT Goals Patient Stated Goal: reports he is ready to walk Progress towards PT goals: Progressing toward goals    Frequency  Min 3X/week      PT Plan Current plan remains appropriate    Co-evaluation              AM-PAC PT "6 Clicks" Mobility   Outcome Measure  Help needed turning from your back to your side while in a flat bed without using bedrails?: A Little Help needed moving from lying on your back to sitting on the side of a flat bed without using bedrails?: A Little Help needed moving to and from a bed to a chair (including a wheelchair)?: A Little Help needed standing up from a chair using your arms (e.g., wheelchair or bedside chair)?: A Lot Help needed to walk in hospital room?: A Little Help needed climbing 3-5 steps with a railing? : A Little 6 Click Score: 17    End of Session   Activity Tolerance: Patient tolerated treatment well Patient left: in chair;with call bell/phone within reach Nurse Communication: Mobility status PT Visit Diagnosis: Unsteadiness on feet  (R26.81);Difficulty in walking, not elsewhere classified (R26.2)     Time: 4259-5638 PT Time Calculation (min) (ACUTE ONLY): 13 min  Charges:  $Gait Training: 8-22 mins                  Ramond Dial 10/23/2020, 4:21 PM Mee Hives, PT MS Acute Rehab Dept. Number: [place] and [place]

## 2020-10-23 NOTE — Progress Notes (Signed)
Occupational Therapy Treatment Patient Details Name: Lawrence [name] MRN: [id number] DOB: 13-Apr-1956 Today's Date: 10/23/2020    History of present illness Patient is a 65 y/o male admitted 10/17/20 with weight gain and SOB. CXR with pulmonary edema. Workup for acute respiratory failure with hypoxia secondary to acute on chronic CHF; also with SVT vs A-fib. Pt with periods of rhythmic jerking and decreased responsiveness; concern for partial seizure. EEG 3/16 within normal limits. Brain MRI 3/17 with no acute abnormality. PMH includes diastolic CHF, COPD, CAD, DM2, PTSD, anxiey, HTN, venous insufficiency.   OT comments  Patient with nice participation and progress toward patient stated goals.  He was able to complete grooming, shaving, and ADL sit/stand in front of sink.  Patient is needing Mod A for sit to stand, and lower body ADL.  Patient was off O2 during the session, and remained about 97% on RA.  Patient will continue to be seen in the acute setting to maximize functional status prior to returning home with [facility].  Patient left with PT for their session.    Follow Up Recommendations  Home health OT    Equipment Recommendations  None recommended by OT    Recommendations for Other Services      Precautions / Restrictions Precautions Precautions: Fall;Other (comment) Precaution Comments: Watch SpO2, HR Restrictions Weight Bearing Restrictions: No       Mobility Bed Mobility Overal bed mobility: Needs Assistance             General bed mobility comments: Received sitting in recliner Patient Response: Cooperative  Transfers Overall transfer level: Needs assistance   Transfers: Sit to/from Stand Sit to Stand: Mod assist              Balance Overall balance assessment: Needs assistance Sitting-balance support: Feet supported;Single extremity supported Sitting balance-Leahy Scale: Good     Standing balance support: Bilateral upper extremity supported Standing  balance-Leahy Scale: Poor Standing balance comment: Reliant on UE support                           ADL either performed or assessed with clinical judgement   ADL       Grooming: Wash/dry hands;Wash/dry face;Oral care;Brushing hair;Set up;Sitting   Upper Body Bathing: Set up;Sitting   Lower Body Bathing: Moderate assistance;Sitting/lateral leans   Upper Body Dressing : Min guard;Sitting   Lower Body Dressing: Moderate assistance;Sit to/from stand               Functional mobility during ADLs: Min guard;Rolling walker General ADL Comments: Mod A for sit to stand     Vision Baseline Vision/History: No visual deficits Patient Visual Report: No change from baseline     Perception     Praxis      Cognition Arousal/Alertness: Awake/alert Behavior During Therapy: WFL for tasks assessed/performed Overall Cognitive Status: Within Functional Limits for tasks assessed                                                            Pertinent Vitals/ Pain       Pain Assessment: No/denies pain Pain Intervention(s): Monitored during session  Frequency  Min 2X/week        Progress Toward Goals  OT Goals(current goals can now be found in the care plan section)  Progress towards OT goals: Progressing toward goals  Acute Rehab OT Goals Patient Stated Goal: to get better and go home OT Goal Formulation: With patient/family Time For Goal Achievement: 11/04/20 Potential to Achieve Goals: Good  Plan Discharge plan remains appropriate    Co-evaluation                 AM-PAC OT "6 Clicks" Daily Activity     Outcome Measure   Help from another person eating meals?: None Help from another person taking care of personal grooming?: None Help from another person toileting, which includes using toliet, bedpan, or urinal?: A Little Help from another  person bathing (including washing, rinsing, drying)?: A Lot Help from another person to put on and taking off regular upper body clothing?: A Little Help from another person to put on and taking off regular lower body clothing?: A Lot 6 Click Score: 18    End of Session Equipment Utilized During Treatment: Rolling walker  OT Visit Diagnosis: Unsteadiness on feet (R26.81);Other abnormalities of gait and mobility (R26.89);Muscle weakness (generalized) (M62.81);Dizziness and giddiness (R42)   Activity Tolerance Patient tolerated treatment well   Patient Left in chair;with call bell/phone within reach   Nurse Communication Other (comment) (patient bathed and dressed)        Time: 2376-2831 OT Time Calculation (min): 29 min  Charges: OT General Charges $OT Visit: 1 Visit OT Treatments $Self Care/Home Management : 23-37 mins  10/23/2020  Rich, OTR/L  Acute Rehabilitation Services  Office:  [phone]    Metta Clines 10/23/2020, 12:12 PM

## 2020-10-23 NOTE — Progress Notes (Signed)
PROGRESS NOTE    Lawrence [name]  IDP:[id number] DOB: [birth year] DOA: 10/16/2020 PCP: Jenny Reichmann, PA-C (Inactive)    Brief Narrative:  65 year old with past medical history significant for hypertension, diabetes, hyperlipidemia, COPD, diastolic heart failure, venous stasis who presented via EMS complaining of shortness of breath.  Patient reported episode of shortness of breath for the last 2 or 3 days prior to admission worse at night.  He was evaluated by EMS and he was found to have oxygen saturation in the 70s.  He was placed on a normal breather mask and subsequently on BiPAP.  He received high-dose methylprednisolone on in route.  Evaluation in the ED chest x-ray show pulmonary edema, initial troponin was 14.  Patient admitted for heart failure exacerbation.   3/16-EEG nml limits. No seizures or epileptiform . 3/17-feeling better alittle. Mri done today 3/18 no overnight issues  Consultants:   Cardiology, neurology  Procedures:   Antimicrobials:       Subjective: Starting to feel better less short of breath  Objective: Vitals:   10/23/20 0445 10/23/20 0730 10/23/20 1210 10/23/20 1230  BP: 132/87 126/83  110/70  Pulse: 85 86  99  Resp: 14 19  18   Temp: 98.4 F (36.9 C) 98.1 F (36.7 C)  98 F (36.7 C)  TempSrc: Oral Oral  Oral  SpO2: 95% 94% 97% 93%  Weight:      Height:        Intake/Output Summary (Last 24 hours) at 10/23/2020 1453 Last data filed at 10/23/2020 1035 Gross per 24 hour  Intake 480 ml  Output 1500 ml  Net -1020 ml   Filed Weights   10/20/20 0527 10/21/20 0139 10/22/20 0808  Weight: (!) 175.2 kg 97.5 kg (!) 178.6 kg    Examination: Sitting in chair, NAD Crackles bilaterally at bases mostly Regular S1-S2 no gallops Soft benign positive bowel sounds Positive edema bilaterally Alert oriented x3 grossly intact     Data Reviewed: I have personally reviewed following labs and imaging studies  CBC: Recent Labs  Lab  10/16/20 2343 10/16/20 2359 10/17/20 0347 10/18/20 0446 10/19/20 0333 10/20/20 0344 10/21/20 0433  WBC 19.3*  --  25.7* 20.1* 14.5* 12.5* 15.4*  NEUTROABS 17.5*  --   --   --   --   --   --   HGB 13.9   < > 14.1 12.5* 13.2 13.3 14.4  HCT 43.3   < > 43.2 37.4* 39.9 39.8 42.7  MCV 89.1  --  88.2 87.6 87.7 87.1 86.1  PLT 288  --  281 276 293 312 351   < > = values in this interval not displayed.   Basic Metabolic Panel: Recent Labs  Lab 10/17/20 0347 10/17/20 0701 10/17/20 1625 10/19/20 0333 10/20/20 0344 10/21/20 0433 10/22/20 0345 10/23/20 0423  NA  --  130*   < > 131* 131* 129* 128* 129*  K  --  3.3*   < > 3.7 4.0 4.3 3.9 4.0  CL  --  88*   < > 88* 87* 90* 89* 88*  CO2  --  30   < > 31 35* 27 28 31   GLUCOSE  --  341*   < > 233* 197* 234* 189* 172*  BUN  --  18   < > 32* 24* 22 25* 29*  CREATININE 1.01 1.01   < > 1.17 1.14 1.01 1.14 1.27*  CALCIUM  --  9.0   < > 8.8* 9.4 9.2 9.1 9.0  MG 1.8 1.8  --  2.1  --  2.1  --   --    < > = values in this interval not displayed.   GFR: Estimated Creatinine Clearance: 101.7 mL/min (A) (by C-G formula based on SCr of 1.27 mg/dL (H)). Liver Function Tests: Recent Labs  Lab 10/16/20 2343  AST 25  ALT 26  ALKPHOS 77  BILITOT 1.0  PROT 7.4  ALBUMIN 3.7   No results for input(s): LIPASE, AMYLASE in the last 168 hours. No results for input(s): AMMONIA in the last 168 hours. Coagulation Profile: No results for input(s): INR, PROTIME in the last 168 hours. Cardiac Enzymes: No results for input(s): CKTOTAL, CKMB, CKMBINDEX, TROPONINI in the last 168 hours. BNP (last 3 results) No results for input(s): PROBNP in the last 8760 hours. HbA1C: No results for input(s): HGBA1C in the last 72 hours. CBG: Recent Labs  Lab 10/22/20 1139 10/22/20 1601 10/22/20 2106 10/23/20 0539 10/23/20 1222  GLUCAP 290* 311* 232* 192* 216*   Lipid Profile: No results for input(s): CHOL, HDL, LDLCALC, TRIG, CHOLHDL, LDLDIRECT in the last 72  hours. Thyroid Function Tests: No results for input(s): TSH, T4TOTAL, FREET4, T3FREE, THYROIDAB in the last 72 hours. Anemia Panel: No results for input(s): VITAMINB12, FOLATE, FERRITIN, TIBC, IRON, RETICCTPCT in the last 72 hours. Sepsis Labs: No results for input(s): PROCALCITON, LATICACIDVEN in the last 168 hours.  Recent Results (from the past 240 hour(s))  SARS CORONAVIRUS 2 (TAT 6-24 HRS) Nasopharyngeal Nasopharyngeal Swab     Status: None   Collection Time: 10/16/20 11:42 PM   Specimen: Nasopharyngeal Swab  Result Value Ref Range Status   SARS Coronavirus 2 NEGATIVE NEGATIVE Final    Comment: (NOTE) SARS-CoV-2 target nucleic acids are NOT DETECTED.  The SARS-CoV-2 RNA is generally detectable in upper and lower respiratory specimens during the acute phase of infection. Negative results do not preclude SARS-CoV-2 infection, do not rule out co-infections with other pathogens, and should not be used as the sole basis for treatment or other patient management decisions. Negative results must be combined with clinical observations, patient history, and epidemiological information. The expected result is Negative.  Fact Sheet for Patients: SugarRoll.be  Fact Sheet for Healthcare Providers: https://www.woods-mathews.com/  This test is not yet approved or cleared by the [place] FDA and  has been authorized for detection and/or diagnosis of SARS-CoV-2 by FDA under an Emergency Use Authorization (EUA). This EUA will remain  in effect (meaning this test can be used) for the duration of the COVID-19 declaration under Se ction 564(b)(1) of the Act, 21 U.S.C. section 360bbb-3(b)(1), unless the authorization is terminated or revoked sooner.  Performed at [facility], [place] [address], [place], [place] [address]          Radiology Studies: MR BRAIN WO CONTRAST  Result Date: 10/22/2020 CLINICAL DATA:  Seizure, nontraumatic.  EXAM: MRI HEAD WITHOUT CONTRAST TECHNIQUE: Multiplanar, multiecho pulse sequences of the brain and surrounding structures were obtained without intravenous contrast. COMPARISON:  MRI of the brain October 20, 2020 FINDINGS: The study is partially degraded by motion. Brain: No acute infarction, hemorrhage, hydrocephalus, extra-axial collection or mass lesion. Vascular: Normal flow voids. Skull and upper cervical spine: Normal marrow signal. Sinuses/Orbits: Negative. IMPRESSION: No acute intracranial abnormality identified. Electronically Signed   By: Pedro Earls M.D.   On: 10/22/2020 12:20        Scheduled Meds: . ARIPiprazole  5 mg Oral QHS  . aspirin EC  81  mg Oral Daily  . atorvastatin  40 mg Oral Daily  . enoxaparin (LOVENOX) injection  90 mg Subcutaneous Q24H  . fluticasone  2 spray Each Nare Daily  . insulin aspart  0-15 Units Subcutaneous TID WC  . insulin aspart  0-5 Units Subcutaneous QHS  . insulin aspart  11 Units Subcutaneous TID WC  . insulin glargine  35 Units Subcutaneous Daily  . metoprolol succinate  25 mg Oral Daily  . potassium chloride  40 mEq Oral Daily  . pregabalin  100 mg Oral TID  . sodium chloride flush  3 mL Intravenous Q12H  . spironolactone  25 mg Oral Daily  . sulfamethoxazole-trimethoprim  1 tablet Oral Q12H  . temazepam  15 mg Oral QHS  . torsemide  20 mg Oral QPM  . torsemide  40 mg Oral q morning  . traZODone  200 mg Oral QHS  . venlafaxine XR  225 mg Oral Daily   Continuous Infusions: . sodium chloride Stopped (10/18/20 1255)  . sodium chloride Stopped (10/19/20 2055)  . levETIRAcetam 1,000 mg (10/23/20 9381)    Assessment & Plan:   Principal Problem:   Acute on chronic diastolic CHF (congestive heart failure) (HCC) Active Problems:   Morbid obesity ([place])   Acute respiratory failure with hypoxia (HCC)   Diabetes mellitus type 2 in obese (HCC)   QT prolongation   Essential hypertension   Hypokalemia   Seizure ([place])    Coronary artery disease due to lipid rich plaque   SVT (supraventricular tachycardia) (HCC)   1-Acute Hypoxic Respiratory Failure: Secondary to Acute Diastolic Heart Failure Exacerbation -Patient presented hypoxic oxygen saturation 70% initially required BiPAP and nonrebreather mask. Lasix and Metolazone>>changed to Demadex 60mg  BID  3/18 good urine output  Clinically slowly improving  3/17-clinically improving  Creatinine mildly elevated at 1.27 Continue fluid restriction 1500 cc Decrease Demadex to 40 mg every morning and 20 mg every afternoon for now since creatinine bumped Continue spironolactone 25 mg daily Cards following       2-Partial Seizure -Patient develops right arm tremor/jerking movement. He is alert, answer questions. Per wife patient had this tremors on admission, that was why she brought him here on Friday, he had episode of urinary incontinence as well. He hasn't had tremors since admission.  -He has been off Effexor, since admission, but per wife he was having this tremors at home and he was taking his effexor.  3/18 EEG was negative  MRI of brain negative for acute changes Neurology following Continue temazepam 15mg  qhs, pregabalin 100mg  tid at discharge Continue Keppra 1000 mg twice daily  F/u outpt with neurology , tapering keppra may be considered as o/p pdning resolution of infection if no further clinical events per neurolgy       3-Diabetes type 2 hyperglycemia: A1c 9.2 3/18 blood glucose still elevated but better We will increase Lantus to 40 units  Increase NovoLog to 15 U tid with meals      4-Hypertension:  On beta blk    5-Hypokalemia: Hyponatremia- slighlty higher at 129 Continue to monitor     6-prolonged QT: PVC: received mg, k replaced.   7-Morbid obesity: diet education.  -LE Cellulitis; on chronic venous stasis.  Patient presents with worsening redness; started Ceftriaxone, and Bactrim.  WBC trending down.  Suspect leukocytosis is also elevated secondary to steroids.  Wound care consulted, appreciate assistance.  Doppler: negative for DVT>  Continue on abx  -8SVT/ ;[facility] Telemetry Cardiology consulted.  HR elevated, BP improved.  Per cardiology continue to hold BB  9-COPD; Resume Inhaler.  -Hypotension; BP low; hold lisinopril.  Depression;  Resume effexor, abilify/ Temazepam.  Continue with lyrica.    Estimated body mass index is 49.6 kg/m as calculated from the following:   Height as of this encounter: 6\' 2"  (1.88 m).   Weight as of this encounter: 175.2 kg.     DVT prophylaxis: lovenox Code Status:DNR Family Communication: none at bedside Status is: Inpatient  Remains inpatient appropriate because:Inpatient level of care appropriate due to severity of illness   Dispo: The patient is from: Home              Anticipated d/c is to: Home              Patient currently is not medically stable to d/c.   Difficult to place patient No            LOS: 6 days   Time spent: 35 min with >50% on co    Nolberto Hanlon, MD [facility] Pager [phone]  If 7PM-7AM, please contact night-coverage 10/23/2020, 2:53 PM

## 2020-10-24 DIAGNOSIS — I10 Essential (primary) hypertension: Secondary | ICD-10-CM | POA: Diagnosis not present

## 2020-10-24 DIAGNOSIS — I2510 Atherosclerotic heart disease of native coronary artery without angina pectoris: Secondary | ICD-10-CM | POA: Diagnosis not present

## 2020-10-24 DIAGNOSIS — I5033 Acute on chronic diastolic (congestive) heart failure: Secondary | ICD-10-CM | POA: Diagnosis not present

## 2020-10-24 LAB — GLUCOSE, CAPILLARY
Glucose-Capillary: 159 mg/dL — ABNORMAL HIGH (ref 70–99)
Glucose-Capillary: 195 mg/dL — ABNORMAL HIGH (ref 70–99)
Glucose-Capillary: 195 mg/dL — ABNORMAL HIGH (ref 70–99)
Glucose-Capillary: 158 mg/dL — ABNORMAL HIGH (ref 70–99)

## 2020-10-24 LAB — BASIC METABOLIC PANEL
Anion gap: 12 (ref 5–15)
BUN: 29 mg/dL — ABNORMAL HIGH (ref 8–23)
CO2: 24 mmol/L (ref 22–32)
Calcium: 8.7 mg/dL — ABNORMAL LOW (ref 8.9–10.3)
Chloride: 91 mmol/L — ABNORMAL LOW (ref 98–111)
Creatinine, Ser: 1.11 mg/dL (ref 0.61–1.24)
GFR, Estimated: >60 mL/min (ref >60)
Glucose, Bld: 143 mg/dL — ABNORMAL HIGH (ref 70–99)
Potassium: 4.4 mmol/L (ref 3.5–5.1)
Sodium: 127 mmol/L — ABNORMAL LOW (ref 135–145)

## 2020-10-24 MED ORDER — TORSEMIDE 20 MG PO TABS
20.0000 mg | ORAL_TABLET | Freq: Two times a day (BID) | ORAL | Status: DC
Start: 2020-10-25 — End: 2020-10-26
  Administered 2020-10-25 – 2020-10-26 (×4): 20 mg via ORAL
  Filled 2020-10-24 (×4): qty 1

## 2020-10-24 NOTE — Progress Notes (Addendum)
Progress Note  Patient Name: Lawrence [name] Date of Encounter: 10/24/2020  [facility] Cardiologist: Fransico Him, MD   Subjective   Feeling good, denies any chest pain or SOB  He put out 2.5L yesterday and is net neg 15.4L  SCr improved to 1.11 but Na still low at 127  Inpatient Medications    Scheduled Meds: . ARIPiprazole  5 mg Oral QHS  . aspirin EC  81 mg Oral Daily  . atorvastatin  40 mg Oral Daily  . enoxaparin (LOVENOX) injection  40 mg Subcutaneous Q24H  . fluticasone  2 spray Each Nare Daily  . insulin aspart  0-15 Units Subcutaneous TID WC  . insulin aspart  0-5 Units Subcutaneous QHS  . insulin aspart  15 Units Subcutaneous TID WC  . insulin glargine  40 Units Subcutaneous Daily  . metoprolol succinate  25 mg Oral Daily  . potassium chloride  40 mEq Oral Daily  . pregabalin  100 mg Oral TID  . sodium chloride flush  3 mL Intravenous Q12H  . spironolactone  25 mg Oral Daily  . sulfamethoxazole-trimethoprim  1 tablet Oral Q12H  . temazepam  15 mg Oral QHS  . torsemide  20 mg Oral QPM  . torsemide  40 mg Oral q morning  . traZODone  200 mg Oral QHS  . venlafaxine XR  225 mg Oral Daily   Continuous Infusions: . sodium chloride Stopped (10/18/20 1255)  . sodium chloride Stopped (10/19/20 2055)  . levETIRAcetam 1,000 mg (10/24/20 1610)   PRN Meds: sodium chloride, sodium chloride, acetaminophen, albuterol, LORazepam, oxyCODONE, sodium chloride flush   Vital Signs    Vitals:   10/23/20 2215 10/24/20 0011 10/24/20 0626 10/24/20 0822  BP:  131/84  (!) 124/109  Pulse: 79 79  97  Resp: 14 13    Temp:  97.6 F (36.4 C)    TempSrc:  Oral    SpO2: 93% 93%  92%  Weight:   (!) 182.8 kg   Height:        Intake/Output Summary (Last 24 hours) at 10/24/2020 0843 Last data filed at 10/24/2020 0800 Gross per 24 hour  Intake 2039 ml  Output 2500 ml  Net -461 ml   Last 3 Weights 10/24/2020 10/22/2020 10/21/2020  Weight (lbs) 403 lb 393 lb 11.9 oz 215 lb   Weight (kg) 182.8 kg 178.6 kg 97.523 kg      Telemetry  NSR- Personally Reviewed  ECG    No new EKG to review - Personally Reviewed  Physical Exam   GEN: Well nourished, well developed in no acute distress HEENT: Normal NECK: No JVD; No carotid bruits LYMPHATICS: No lymphadenopathy CARDIAC:RRR, no murmurs, rubs, gallops RESPIRATORY:  Clear to auscultation without rales, wheezing or rhonchi  ABDOMEN: Soft, non-tender, non-distended MUSCULOSKELETAL:  Chronic venous stasis changes in BLE; No deformity  SKIN: Warm and dry NEUROLOGIC:  Alert and oriented x 3 PSYCHIATRIC:  Normal affect    Labs    High Sensitivity Troponin:   Recent Labs  Lab 10/16/20 2343 10/17/20 0143 10/17/20 0628 10/17/20 0701  TROPONINIHS 14 18* 14 12      Chemistry Recent Labs  Lab 10/22/20 0345 10/23/20 0423 10/23/20 1526 10/24/20 0253  NA 128* 129*  --  127*  K 3.9 4.0  --  4.4  CL 89* 88*  --  91*  CO2 28 31  --  24  GLUCOSE 189* 172*  --  143*  BUN 25* 29*  --  29*  CREATININE 1.14 1.27* 1.29* 1.11  CALCIUM 9.1 9.0  --  8.7*  GFRNONAA >60 >60 >60 >60  ANIONGAP 11 10  --  12     Hematology Recent Labs  Lab 10/20/20 0344 10/21/20 0433 10/23/20 1526  WBC 12.5* 15.4* 17.6*  RBC 4.57 4.96 5.06  HGB 13.3 14.4 14.4  HCT 39.8 42.7 44.5  MCV 87.1 86.1 87.9  MCH 29.1 29.0 28.5  MCHC 33.4 33.7 32.4  RDW 14.8 14.6 14.6  PLT 312 351 403*    BNP Recent Labs  Lab 10/22/20 0345  BNP 36.3     DDimer No results for input(s): DDIMER in the last 168 hours.   Radiology    MR BRAIN WO CONTRAST  Result Date: 10/22/2020 CLINICAL DATA:  Seizure, nontraumatic. EXAM: MRI HEAD WITHOUT CONTRAST TECHNIQUE: Multiplanar, multiecho pulse sequences of the brain and surrounding structures were obtained without intravenous contrast. COMPARISON:  MRI of the brain October 20, 2020 FINDINGS: The study is partially degraded by motion. Brain: No acute infarction, hemorrhage, hydrocephalus, extra-axial  collection or mass lesion. Vascular: Normal flow voids. Skull and upper cervical spine: Normal marrow signal. Sinuses/Orbits: Negative. IMPRESSION: No acute intracranial abnormality identified. Electronically Signed   By: Pedro Earls M.D.   On: 10/22/2020 12:20    Cardiac Studies   TTE 03/08/19: IMPRESSIONS  1. The left ventricle has normal systolic function, with an ejection  fraction of 55-60%. The cavity size was normal. There is moderate  concentric left ventricular hypertrophy. Left ventricular diastolic  Doppler parameters are consistent with  pseudonormalization. No evidence of left ventricular regional wall motion  abnormalities.  2. The right ventricle has normal systolic function. The cavity was  normal. There is no increase in right ventricular wall thickness.  3. Left atrial size was mildly dilated.  4. Right atrial size was mildly dilated.  5. Mild thickening of the mitral valve leaflet. Mild calcification of the  mitral valve leaflet. There is moderate to severe mitral annular  calcification present.  6. The aortic valve is tricuspid. Moderate thickening of the aortic  valve. Moderate calcification of the aortic valve. Mild stenosis of the  aortic valve.  7. The aorta is normal in size and structure.  8. The aortic root is normal in size and structure.   Cath 01/17/18:  There is hyperdynamic left ventricular systolic function.  LV end diastolic pressure is normal.  The left ventricular ejection fraction is greater than 65% by visual estimate.  There is no mitral valve regurgitation.  There is no aortic valve stenosis.  Prox LAD to Mid LAD lesion is 40% stenosed.  Prox Cx to Mid Cx lesion is 20% stenosed.  Ost 2nd Mrg lesion is 20% stenosed.  Angiographically minimal coronary disease with ectatic RCA and circumflex. Normal LVEDP and normal LV function/hyperdynamic.   Plan: Return to nursing unit for post catheterization care &  TR band removal. Would be okay for discharge from cardiac standpoint after bedrest post TR band removal.  Defer further plans to primary team and consulting cardiologist.  [place] 03/12/19: Max 200 bpm 09:28pm, 07/28 Min 66 bpm 08:09am, 07/28 Avg 92 bpm Multiple episodes of SVT Fastest episode at 200 BPM for 14 beats Longest episode at 15 minutes with average HR of 137 bpm Symptoms of chest pain associated with sinus rhythm Artifact makes interpretation difficult   Patient Profile     65 y.o. male with a hx of nonobstructive CAD, HTN, HLD, diastolic HF, OSA on BiPAP, morbid  obesity, depression and anxiety who presented with worsening SOB and LE edema found to have acute on chronic HF exacerbation for which Cardiology has been consulted.  Assessment & Plan    #Acute on chronic diastolic heart failure: Patient with known history of diastolic heart failure now acutely decompensated with worsening DOE, LE edema and orthopnea. Likely triggered by underlying infection. Review of monitor showed sinus tach with PACs; no definitive Afib. Initially required BiPAP now weaned to [facility]. Appears grossly overloaded on examination with NYHA class III symptoms. -much improved UOP with increased dose of Lasix and Metolazone>>changed to Demadex 60mg  BID  -He put out 2.5L yesterday and is net neg 15.4L -SCr improved to 1.11 but Na still low at 127 -weight is inaccurate today but he has lost over 25lbs since admit -continue spiro 25mg  daily  -he is continuing to have brisk diuresis and Na decreased so I will decrease Demadex to 20mg  BID starting tomorrow since he already had 40mg  this am -increase fluid restriction to 1200cc for hyponatremia -follow I&O's, daily weights and renal function -Low Na diet  #Known history of SVT #Possible Afib: Monitor with possible runs of Afib however very difficult to determine if true Afib vs sinus tach with PACs and runs of SVT. Previous monitor in 2020 with SVT. Asked  patient to bring in home monitor that was reportedly detecting Afib. -SVT has significantly improved with addition of BB -continue Toprol XL 25mg  daily and titrate as needed for SVT and as BP tolerates -Continue telemetry -If true Afib captured on tele or ECG, would merit [facility]  #Nonobstructive CAD: -No evidence of ischemia and patient chest pain free.  -Minimal disease on cath in 2019 -Continue ASA and statin  #HTN: -BP remains controlled at 131/16mmHg -Continue spiro 25mg  daily and Toprol XL 25mg  daily  #HLD: -Continue lipitor 40mg  daily -LDL 58 this admit  #Morbid Obesity: -Consider ozempic vs wegovy as out-patient  #DMII: -Management per primary   #CKD: -Cr improved to 1.11 today -continue to watch closely with diuresis  #Cellulitis: #Leukocytosis: -On IV CTX per primary team    I have spent a total of 35 minutes with patient reviewing 2D echo , telemetry, EKGs, labs and examining patient as well as establishing an assessment and plan that was discussed with the patient.  > 50% of time was spent in direct patient care.    For questions or updates, please contact [place] Please consult [email or web address] for contact info under        Signed, Fransico Him, MD  10/24/2020, 8:43 AM

## 2020-10-24 NOTE — Plan of Care (Signed)

## 2020-10-24 NOTE — Progress Notes (Signed)
PROGRESS NOTE    Lawrence [name]  LOV:[id number] DOB: 08-20-1955 DOA: 10/16/2020 PCP: Jenny Reichmann, PA-C (Inactive)    Brief Narrative:  65 year old with past medical history significant for hypertension, diabetes, hyperlipidemia, COPD, diastolic heart failure, venous stasis who presented via EMS complaining of shortness of breath.  Patient reported episode of shortness of breath for the last 2 or 3 days prior to admission worse at night.  He was evaluated by EMS and he was found to have oxygen saturation in the 70s.  He was placed on a normal breather mask and subsequently on BiPAP.  He received high-dose methylprednisolone on in route.  Evaluation in the ED chest x-ray show pulmonary edema, initial troponin was 14.  Patient admitted for heart failure exacerbation.   3/16-EEG nml limits. No seizures or epileptiform . 3/17-feeling better alittle. Mri done today 3/18 no overnight issues 3/19-starting to feel less edematous and better.  Sodium down to 127, creatinine down to 1.11   Consultants:   Cardiology, neurology  Procedures:   Antimicrobials:       Subjective: Feeling better, less short of breath  Objective: Vitals:   10/23/20 2215 10/24/20 0011 10/24/20 0626 10/24/20 0822  BP:  131/84  (!) 124/109  Pulse: 79 79  97  Resp: 14 13    Temp:  97.6 F (36.4 C)    TempSrc:  Oral    SpO2: 93% 93%  92%  Weight:   (!) 182.8 kg   Height:        Intake/Output Summary (Last 24 hours) at 10/24/2020 0846 Last data filed at 10/24/2020 0800 Gross per 24 hour  Intake 2039 ml  Output 2500 ml  Net -461 ml   Filed Weights   10/21/20 0139 10/22/20 0808 10/24/20 0626  Weight: 97.5 kg (!) 178.6 kg (!) 182.8 kg    Examination: NAD, sitting in chair, comfortable Decreased breath sounds at bases no wheeze rales rhonchi's Regular S1-S2 no gallops Soft benign positive bowel sounds Positive edema decreasing Alert oriented x3    Data Reviewed: I have personally  reviewed following labs and imaging studies  CBC: Recent Labs  Lab 10/18/20 0446 10/19/20 0333 10/20/20 0344 10/21/20 0433 10/23/20 1526  WBC 20.1* 14.5* 12.5* 15.4* 17.6*  HGB 12.5* 13.2 13.3 14.4 14.4  HCT 37.4* 39.9 39.8 42.7 44.5  MCV 87.6 87.7 87.1 86.1 87.9  PLT 276 293 312 351 884*   Basic Metabolic Panel: Recent Labs  Lab 10/19/20 0333 10/20/20 0344 10/21/20 0433 10/22/20 0345 10/23/20 0423 10/23/20 1526 10/24/20 0253  NA 131* 131* 129* 128* 129*  --  127*  K 3.7 4.0 4.3 3.9 4.0  --  4.4  CL 88* 87* 90* 89* 88*  --  91*  CO2 31 35* 27 28 31   --  24  GLUCOSE 233* 197* 234* 189* 172*  --  143*  BUN 32* 24* 22 25* 29*  --  29*  CREATININE 1.17 1.14 1.01 1.14 1.27* 1.29* 1.11  CALCIUM 8.8* 9.4 9.2 9.1 9.0  --  8.7*  MG 2.1  --  2.1  --   --   --   --    GFR: Estimated Creatinine Clearance: 117.9 mL/min (by C-G formula based on SCr of 1.11 mg/dL). Liver Function Tests: No results for input(s): AST, ALT, ALKPHOS, BILITOT, PROT, ALBUMIN in the last 168 hours. No results for input(s): LIPASE, AMYLASE in the last 168 hours. No results for input(s): AMMONIA in the last 168 hours. Coagulation Profile: No results  for input(s): INR, PROTIME in the last 168 hours. Cardiac Enzymes: No results for input(s): CKTOTAL, CKMB, CKMBINDEX, TROPONINI in the last 168 hours. BNP (last 3 results) No results for input(s): PROBNP in the last 8760 hours. HbA1C: No results for input(s): HGBA1C in the last 72 hours. CBG: Recent Labs  Lab 10/23/20 0539 10/23/20 1222 10/23/20 1642 10/23/20 2113 10/24/20 0614  GLUCAP 192* 216* 217* 191* 159*   Lipid Profile: No results for input(s): CHOL, HDL, LDLCALC, TRIG, CHOLHDL, LDLDIRECT in the last 72 hours. Thyroid Function Tests: No results for input(s): TSH, T4TOTAL, FREET4, T3FREE, THYROIDAB in the last 72 hours. Anemia Panel: No results for input(s): VITAMINB12, FOLATE, FERRITIN, TIBC, IRON, RETICCTPCT in the last 72 hours. Sepsis  Labs: No results for input(s): PROCALCITON, LATICACIDVEN in the last 168 hours.  Recent Results (from the past 240 hour(s))  SARS CORONAVIRUS 2 (TAT 6-24 HRS) Nasopharyngeal Nasopharyngeal Swab     Status: None   Collection Time: 10/16/20 11:42 PM   Specimen: Nasopharyngeal Swab  Result Value Ref Range Status   SARS Coronavirus 2 NEGATIVE NEGATIVE Final    Comment: (NOTE) SARS-CoV-2 target nucleic acids are NOT DETECTED.  The SARS-CoV-2 RNA is generally detectable in upper and lower respiratory specimens during the acute phase of infection. Negative results do not preclude SARS-CoV-2 infection, do not rule out co-infections with other pathogens, and should not be used as the sole basis for treatment or other patient management decisions. Negative results must be combined with clinical observations, patient history, and epidemiological information. The expected result is Negative.  Fact Sheet for Patients: SugarRoll.be  Fact Sheet for Healthcare Providers: https://www.woods-mathews.com/  This test is not yet approved or cleared by the [place] FDA and  has been authorized for detection and/or diagnosis of SARS-CoV-2 by FDA under an Emergency Use Authorization (EUA). This EUA will remain  in effect (meaning this test can be used) for the duration of the COVID-19 declaration under Se ction 564(b)(1) of the Act, 21 U.S.C. section 360bbb-3(b)(1), unless the authorization is terminated or revoked sooner.  Performed at [facility], [place] [address], [place], [place] [address]          Radiology Studies: MR BRAIN WO CONTRAST  Result Date: 10/22/2020 CLINICAL DATA:  Seizure, nontraumatic. EXAM: MRI HEAD WITHOUT CONTRAST TECHNIQUE: Multiplanar, multiecho pulse sequences of the brain and surrounding structures were obtained without intravenous contrast. COMPARISON:  MRI of the brain October 20, 2020 FINDINGS: The study is partially  degraded by motion. Brain: No acute infarction, hemorrhage, hydrocephalus, extra-axial collection or mass lesion. Vascular: Normal flow voids. Skull and upper cervical spine: Normal marrow signal. Sinuses/Orbits: Negative. IMPRESSION: No acute intracranial abnormality identified. Electronically Signed   By: Pedro Earls M.D.   On: 10/22/2020 12:20        Scheduled Meds: . ARIPiprazole  5 mg Oral QHS  . aspirin EC  81 mg Oral Daily  . atorvastatin  40 mg Oral Daily  . enoxaparin (LOVENOX) injection  40 mg Subcutaneous Q24H  . fluticasone  2 spray Each Nare Daily  . insulin aspart  0-15 Units Subcutaneous TID WC  . insulin aspart  0-5 Units Subcutaneous QHS  . insulin aspart  15 Units Subcutaneous TID WC  . insulin glargine  40 Units Subcutaneous Daily  . metoprolol succinate  25 mg Oral Daily  . potassium chloride  40 mEq Oral Daily  . pregabalin  100 mg Oral TID  . sodium chloride flush  3  mL Intravenous Q12H  . spironolactone  25 mg Oral Daily  . sulfamethoxazole-trimethoprim  1 tablet Oral Q12H  . temazepam  15 mg Oral QHS  . torsemide  20 mg Oral QPM  . torsemide  40 mg Oral q morning  . traZODone  200 mg Oral QHS  . venlafaxine XR  225 mg Oral Daily   Continuous Infusions: . sodium chloride Stopped (10/18/20 1255)  . sodium chloride Stopped (10/19/20 2055)  . levETIRAcetam 1,000 mg (10/24/20 8099)    Assessment & Plan:   Principal Problem:   Acute on chronic diastolic CHF (congestive heart failure) (HCC) Active Problems:   Morbid obesity ([place])   Acute respiratory failure with hypoxia (HCC)   Diabetes mellitus type 2 in obese (HCC)   QT prolongation   Essential hypertension   Hypokalemia   Seizure ([place])   Coronary artery disease due to lipid rich plaque   SVT (supraventricular tachycardia) (HCC)   1-Acute Hypoxic Respiratory Failure: Secondary to Acute Diastolic Heart Failure Exacerbation -Patient presented hypoxic oxygen saturation 70%  initially required BiPAP and nonrebreather mask. Lasix and Metolazone>>changed to Demadex 60mg  BID  Clinically slowly improving  3/19-see sodium has dropped will decrease tramadol dose to 20 mg starting in a.m. as he already had 40 mg this a.m.  Fluid restriction to 1200 cc for hyponatremia  Monitor I's and O's and renal function        2-Partial Seizure -Patient develops right arm tremor/jerking movement. He is alert, answer questions. Per wife patient had this tremors on admission, that was why she brought him here on Friday, he had episode of urinary incontinence as well. He hasn't had tremors since admission.  -He has been off Effexor, since admission, but per wife he was having this tremors at home and he was taking his effexor.  3/18 EEG was negative  MRI of brain negative for acute changes 3/19-neurology following Continue temazepam 15 mg nightly, pregabalin 100 mg 3 times daily at discharge Continue Keppra 1000 mg twice daily Follow-up outpatient with neurology, tapered Keppra may be considered as outpatient pending the resolution of infections no further clinical events per neurology     3-Diabetes type 2 hyperglycemia: A1c 9.2 3/19-BG stable  We will continue Lantus 40 units and NovoLog 15 units 3 times daily with meals    4-Hypertension:  Stable continue on beta-blockers and spironolactone    5-Hypokalemia: Hyponatremia- Down to 127 On fluid restriction 1200 And decreasing diuretics as above      6-prolonged QT: PVC: received mg, k replaced.   7-Morbid obesity: diet education.  -LE Cellulitis; on chronic venous stasis.  Patient presents with worsening redness; started Ceftriaxone, and Bactrim.  WBC trending down. Suspect leukocytosis is also elevated secondary to steroids.  Wound care consulted, appreciate assistance.  Doppler: negative for DVT>  Continue on abx  8-SVT/ PAC Cards following.?  Patient with A. fib however very difficult to  determine with PACs and runs of SVT. SVT improved with beta-blockers Continue beta-blockers If true A. fib captured on telemetry or ECG, with married anticoagulation    9-COPD; not in acute exacerbation  10.Depression;  abilify/ Temazepam.  Continue with lyrica.    Estimated body mass index is 49.6 kg/m as calculated from the following:   Height as of this encounter: 6\' 2"  (8.33 m).   Weight as of this encounter: 175.2 kg.     DVT prophylaxis: lovenox Code Status:DNR Family Communication: none at bedside Status is: Inpatient  Remains inpatient appropriate because:Inpatient  level of care appropriate due to severity of illness   Dispo: The patient is from: Home              Anticipated d/c is to: Home              Patient currently is not medically stable to d/c.   Difficult to place patient No            LOS: 7 days   Time spent: 35 min with >50% on co    Nolberto Hanlon, MD [facility] Pager [phone]  If 7PM-7AM, please contact night-coverage 10/24/2020, 8:46 AM

## 2020-10-25 ENCOUNTER — Other Ambulatory Visit: Payer: Self-pay | Admitting: Physician Assistant

## 2020-10-25 DIAGNOSIS — I5032 Chronic diastolic (congestive) heart failure: Secondary | ICD-10-CM

## 2020-10-25 DIAGNOSIS — I2510 Atherosclerotic heart disease of native coronary artery without angina pectoris: Secondary | ICD-10-CM | POA: Diagnosis not present

## 2020-10-25 DIAGNOSIS — I5033 Acute on chronic diastolic (congestive) heart failure: Secondary | ICD-10-CM | POA: Diagnosis not present

## 2020-10-25 DIAGNOSIS — I10 Essential (primary) hypertension: Secondary | ICD-10-CM | POA: Diagnosis not present

## 2020-10-25 LAB — BASIC METABOLIC PANEL
Anion gap: 9 (ref 5–15)
BUN: 23 mg/dL (ref 8–23)
CO2: 27 mmol/L (ref 22–32)
Calcium: 8.6 mg/dL — ABNORMAL LOW (ref 8.9–10.3)
Chloride: 91 mmol/L — ABNORMAL LOW (ref 98–111)
Creatinine, Ser: 1.01 mg/dL (ref 0.61–1.24)
GFR, Estimated: >60 mL/min (ref >60)
Glucose, Bld: 164 mg/dL — ABNORMAL HIGH (ref 70–99)
Potassium: 4.3 mmol/L (ref 3.5–5.1)
Sodium: 127 mmol/L — ABNORMAL LOW (ref 135–145)

## 2020-10-25 LAB — CBC
HCT: 39.6 % (ref 39.0–52.0)
Hemoglobin: 13.1 g/dL (ref 13.0–17.0)
MCH: 28.8 pg (ref 26.0–34.0)
MCHC: 33.1 g/dL (ref 30.0–36.0)
MCV: 87 fL (ref 80.0–100.0)
Platelets: 351 10*3/uL (ref 150–400)
RBC: 4.55 MIL/uL (ref 4.22–5.81)
RDW: 14.5 % (ref 11.5–15.5)
WBC: 11.9 10*3/uL — ABNORMAL HIGH (ref 4.0–10.5)
nRBC: 0 % (ref 0.0–0.2)

## 2020-10-25 LAB — GLUCOSE, CAPILLARY
Glucose-Capillary: 155 mg/dL — ABNORMAL HIGH (ref 70–99)
Glucose-Capillary: 151 mg/dL — ABNORMAL HIGH (ref 70–99)
Glucose-Capillary: 105 mg/dL — ABNORMAL HIGH (ref 70–99)
Glucose-Capillary: 135 mg/dL — ABNORMAL HIGH (ref 70–99)

## 2020-10-25 MED ORDER — ENOXAPARIN SODIUM 100 MG/ML ~~LOC~~ SOLN
90.0000 mg | SUBCUTANEOUS | Status: DC
  Administered 2020-10-26: 90 mg via SUBCUTANEOUS
  Filled 2020-10-25: qty 1

## 2020-10-25 NOTE — Progress Notes (Signed)
Pt said he will wear the CPAP by himself. Told him to let the RN know if he need my assistance.

## 2020-10-25 NOTE — Progress Notes (Addendum)
Progress Note  Patient Name: Lawrence [name] Date of Encounter: 10/25/2020  [facility] Cardiologist: Fransico Him, MD   Subjective   Feeling good, denies any chest pain or SOB  His Demadex was decreased yesterday and he put out 3.75L yesterday and is net neg 18L since admit  SCr improved to 1.01 but Na still low at 127.  Fluid restriction to 1200cc currently  Inpatient Medications    Scheduled Meds: . ARIPiprazole  5 mg Oral QHS  . aspirin EC  81 mg Oral Daily  . atorvastatin  40 mg Oral Daily  . enoxaparin (LOVENOX) injection  40 mg Subcutaneous Q24H  . fluticasone  2 spray Each Nare Daily  . insulin aspart  0-15 Units Subcutaneous TID WC  . insulin aspart  0-5 Units Subcutaneous QHS  . insulin aspart  15 Units Subcutaneous TID WC  . insulin glargine  40 Units Subcutaneous Daily  . metoprolol succinate  25 mg Oral Daily  . potassium chloride  40 mEq Oral Daily  . pregabalin  100 mg Oral TID  . sodium chloride flush  3 mL Intravenous Q12H  . spironolactone  25 mg Oral Daily  . sulfamethoxazole-trimethoprim  1 tablet Oral Q12H  . temazepam  15 mg Oral QHS  . torsemide  20 mg Oral BID  . traZODone  200 mg Oral QHS  . venlafaxine XR  225 mg Oral Daily   Continuous Infusions: . sodium chloride Stopped (10/18/20 1255)  . sodium chloride Stopped (10/19/20 2055)  . levETIRAcetam 1,000 mg (10/25/20 0521)   PRN Meds: sodium chloride, sodium chloride, acetaminophen, albuterol, LORazepam, oxyCODONE, sodium chloride flush   Vital Signs    Vitals:   10/25/20 0238 10/25/20 0326 10/25/20 0805 10/25/20 0940  BP:  137/86 120/68   Pulse:  88 95 92  Resp:  13  (!) 22  Temp:  98.2 F (36.8 C)  98.3 F (36.8 C)  TempSrc:  Oral  Oral  SpO2:  99%  97%  Weight: (!) 182.1 kg     Height:        Intake/Output Summary (Last 24 hours) at 10/25/2020 1020 Last data filed at 10/25/2020 0941 Gross per 24 hour  Intake 1083 ml  Output 3950 ml  Net -2867 ml   Last 3 Weights  10/25/2020 10/24/2020 10/22/2020  Weight (lbs) 401 lb 6.4 oz 403 lb 393 lb 11.9 oz  Weight (kg) 182.074 kg 182.8 kg 178.6 kg      Telemetry  NSR- Personally Reviewed  ECG    No new EKG to review - Personally Reviewed  Physical Exam   GEN: Well nourished, well developed in no acute distress HEENT: Normal NECK: No JVD; No carotid bruits LYMPHATICS: No lymphadenopathy CARDIAC:RRR, no murmurs, rubs, gallops RESPIRATORY:  Clear to auscultation without rales, wheezing or rhonchi  ABDOMEN: Soft, non-tender, non-distended MUSCULOSKELETAL:  Chronic venous stasis changes; No deformity  SKIN: Warm and dry NEUROLOGIC:  Alert and oriented x 3 PSYCHIATRIC:  Normal affect    Labs    High Sensitivity Troponin:   Recent Labs  Lab 10/16/20 2343 10/17/20 0143 10/17/20 0628 10/17/20 0701  TROPONINIHS 14 18* 14 12      Chemistry Recent Labs  Lab 10/23/20 0423 10/23/20 1526 10/24/20 0253 10/25/20 0227  NA 129*  --  127* 127*  K 4.0  --  4.4 4.3  CL 88*  --  91* 91*  CO2 31  --  24 27  GLUCOSE 172*  --  143* 164*  BUN 29*  --  29* 23  CREATININE 1.27* 1.29* 1.11 1.01  CALCIUM 9.0  --  8.7* 8.6*  GFRNONAA >60 >60 >60 >60  ANIONGAP 10  --  12 9     Hematology Recent Labs  Lab 10/21/20 0433 10/23/20 1526 10/25/20 0227  WBC 15.4* 17.6* 11.9*  RBC 4.96 5.06 4.55  HGB 14.4 14.4 13.1  HCT 42.7 44.5 39.6  MCV 86.1 87.9 87.0  MCH 29.0 28.5 28.8  MCHC 33.7 32.4 33.1  RDW 14.6 14.6 14.5  PLT 351 403* 351    BNP Recent Labs  Lab 10/22/20 0345  BNP 36.3     DDimer No results for input(s): DDIMER in the last 168 hours.   Radiology    No results found.  Cardiac Studies   TTE 03/08/19: IMPRESSIONS  1. The left ventricle has normal systolic function, with an ejection  fraction of 55-60%. The cavity size was normal. There is moderate  concentric left ventricular hypertrophy. Left ventricular diastolic  Doppler parameters are consistent with  pseudonormalization. No  evidence of left ventricular regional wall motion  abnormalities.  2. The right ventricle has normal systolic function. The cavity was  normal. There is no increase in right ventricular wall thickness.  3. Left atrial size was mildly dilated.  4. Right atrial size was mildly dilated.  5. Mild thickening of the mitral valve leaflet. Mild calcification of the  mitral valve leaflet. There is moderate to severe mitral annular  calcification present.  6. The aortic valve is tricuspid. Moderate thickening of the aortic  valve. Moderate calcification of the aortic valve. Mild stenosis of the  aortic valve.  7. The aorta is normal in size and structure.  8. The aortic root is normal in size and structure.   Cath 01/17/18:  There is hyperdynamic left ventricular systolic function.  LV end diastolic pressure is normal.  The left ventricular ejection fraction is greater than 65% by visual estimate.  There is no mitral valve regurgitation.  There is no aortic valve stenosis.  Prox LAD to Mid LAD lesion is 40% stenosed.  Prox Cx to Mid Cx lesion is 20% stenosed.  Ost 2nd Mrg lesion is 20% stenosed.  Angiographically minimal coronary disease with ectatic RCA and circumflex. Normal LVEDP and normal LV function/hyperdynamic.   Plan: Return to nursing unit for post catheterization care & TR band removal. Would be okay for discharge from cardiac standpoint after bedrest post TR band removal.  Defer further plans to primary team and consulting cardiologist.  Zio 03/12/19: Max 200 bpm 09:28pm, 07/28 Min 66 bpm 08:09am, 07/28 Avg 92 bpm Multiple episodes of SVT Fastest episode at 200 BPM for 14 beats Longest episode at 15 minutes with average HR of 137 bpm Symptoms of chest pain associated with sinus rhythm Artifact makes interpretation difficult   Patient Profile     65 y.o. male with a hx of nonobstructive CAD, HTN, HLD, diastolic HF, OSA on BiPAP, morbid obesity,  depression and anxiety who presented with worsening SOB and LE edema found to have acute on chronic HF exacerbation for which Cardiology has been consulted.  Assessment & Plan    #Acute on chronic diastolic heart failure: Patient with known history of diastolic heart failure now acutely decompensated with worsening DOE, LE edema and orthopnea. Likely triggered by underlying infection. Review of monitor showed sinus tach with PACs; no definitive Afib. Initially required BiPAP now weaned to [facility]. Appears grossly overloaded on examination with NYHA class III  symptoms. -much improved UOP with increased dose of Lasix and Metolazone>>changed to Demadex 60mg  BID  -His Demadex was decreased yesterday and he put out 3.75L yesterday and is net neg 18L since admit on Demadex 20mg  BID -SCr improved to 1.01 but Na still low at 127 -weight is inaccurate today but he has lost over 25lbs since admit -continue spiro 25mg  daily  -continue Demadex to 20mg  BID starting tomorrow since he already had 40mg  this am -continue fluid restriction at 1200cc for hyponatremia -follow I&O's, daily weights and renal function -Low Na diet  #Known history of SVT #Possible Afib: Monitor with possible runs of Afib however very difficult to determine if true Afib vs sinus tach with PACs and runs of SVT. Previous monitor in 2020 with SVT. Asked patient to bring in home monitor that was reportedly detecting Afib. -SVT has significantly improved with addition of BB -continue Toprol XL 25mg  daily and titrate as needed for SVT and as BP tolerates -Continue telemetry -If true Afib captured on tele or ECG, would merit [facility]  #Nonobstructive CAD: -No evidence of ischemia and patient chest pain free.  -Minimal disease on cath in 2019 -Continue ASA and statin  #HTN: -BP controlled at 120/23mmHg -Continue spiro 25mg  daily and Toprol XL 25mg  daily  #HLD: -Continue lipitor 40mg  daily -LDL 58 this admit  #Morbid  Obesity: -Consider ozempic vs wegovy as out-patient  #DMII: -Management per primary   #CKD: -Cr improved to 1.01 today -continue to watch closely with diuresis  #Cellulitis: #Leukocytosis: -On IV CTX per primary team    I have spent a total of 35 minutes with patient reviewing 2D echo , telemetry, EKGs, labs and examining patient as well as establishing an assessment and plan that was discussed with the patient.  > 50% of time was spent in direct patient care.   [facility] will sign off.   Medication Recommendations:  ASA 81mg  daily, atorvastatin 40mg  daily, Toprol XL 25mg  daily, spironolactone 25mg  daily, Torsemide 20mg  BID, Kdur 38meq daily Other recommendations (labs, testing, etc):  BMET in 1 week Follow up as an outpatient:  Followup 2 weeks   For questions or updates, please contact [place] Please consult [email or web address] for contact info under        Signed, Fransico Him, MD  10/25/2020, 10:20 AM

## 2020-10-25 NOTE — Progress Notes (Addendum)
PROGRESS NOTE    Lawrence [name]  VQQ:[id number] DOB: 1956-02-09 DOA: 10/16/2020 PCP: Jenny Reichmann, PA-C (Inactive)    Brief Narrative:  65 year old with past medical history significant for hypertension, diabetes, hyperlipidemia, COPD, diastolic heart failure, venous stasis who presented via EMS complaining of shortness of breath.  Patient reported episode of shortness of breath for the last 2 or 3 days prior to admission worse at night.  He was evaluated by EMS and he was found to have oxygen saturation in the 70s.  He was placed on a normal breather mask and subsequently on BiPAP.  He received high-dose methylprednisolone on in route.  Evaluation in the ED chest x-ray show pulmonary edema, initial troponin was 14.  Patient admitted for heart failure exacerbation.   3/16-EEG nml limits. No seizures or epileptiform . 3/17-feeling better alittle. Mri done today 3/18 no overnight issues 3/19-starting to feel less edematous and better.  Sodium down to 127, creatinine down to 1.11 3/20-Na 127, cr. Stable.off 02 sats 92%  Consultants:   Cardiology, neurology  Procedures:   Antimicrobials:       Subjective: Feels better, less DOE. No cp  Objective: Vitals:   10/25/20 0038 10/25/20 0238 10/25/20 0326 10/25/20 0805  BP: (!) 139/92  137/86 120/68  Pulse: 86  88 95  Resp: 18  13   Temp: 97.6 F (36.4 C)  98.2 F (36.8 C)   TempSrc: Oral  Oral   SpO2: 93%  99%   Weight:  (!) 182.1 kg    Height:        Intake/Output Summary (Last 24 hours) at 10/25/2020 0827 Last data filed at 10/25/2020 0809 Gross per 24 hour  Intake 1083 ml  Output 3400 ml  Net -2317 ml   Filed Weights   10/22/20 0808 10/24/20 0626 10/25/20 0238  Weight: (!) 178.6 kg (!) 182.8 kg (!) 182.1 kg    Examination: nad More cta no wheezing Regular s1/s2 no gallop Soft benign +bs +edema but improving aaxoxo3   Data Reviewed: I have personally reviewed following labs and imaging  studies  CBC: Recent Labs  Lab 10/19/20 0333 10/20/20 0344 10/21/20 0433 10/23/20 1526 10/25/20 0227  WBC 14.5* 12.5* 15.4* 17.6* 11.9*  HGB 13.2 13.3 14.4 14.4 13.1  HCT 39.9 39.8 42.7 44.5 39.6  MCV 87.7 87.1 86.1 87.9 87.0  PLT 293 312 351 403* 433   Basic Metabolic Panel: Recent Labs  Lab 10/19/20 0333 10/20/20 0344 10/21/20 0433 10/22/20 0345 10/23/20 0423 10/23/20 1526 10/24/20 0253 10/25/20 0227  NA 131*   < > 129* 128* 129*  --  127* 127*  K 3.7   < > 4.3 3.9 4.0  --  4.4 4.3  CL 88*   < > 90* 89* 88*  --  91* 91*  CO2 31   < > 27 28 31   --  24 27  GLUCOSE 233*   < > 234* 189* 172*  --  143* 164*  BUN 32*   < > 22 25* 29*  --  29* 23  CREATININE 1.17   < > 1.01 1.14 1.27* 1.29* 1.11 1.01  CALCIUM 8.8*   < > 9.2 9.1 9.0  --  8.7* 8.6*  MG 2.1  --  2.1  --   --   --   --   --    < > = values in this interval not displayed.   GFR: Estimated Creatinine Clearance: 129.4 mL/min (by C-G formula based on SCr of 1.01  mg/dL). Liver Function Tests: No results for input(s): AST, ALT, ALKPHOS, BILITOT, PROT, ALBUMIN in the last 168 hours. No results for input(s): LIPASE, AMYLASE in the last 168 hours. No results for input(s): AMMONIA in the last 168 hours. Coagulation Profile: No results for input(s): INR, PROTIME in the last 168 hours. Cardiac Enzymes: No results for input(s): CKTOTAL, CKMB, CKMBINDEX, TROPONINI in the last 168 hours. BNP (last 3 results) No results for input(s): PROBNP in the last 8760 hours. HbA1C: No results for input(s): HGBA1C in the last 72 hours. CBG: Recent Labs  Lab 10/24/20 0614 10/24/20 1102 10/24/20 1627 10/24/20 2105 10/25/20 0618  GLUCAP 159* 195* 195* 158* 155*   Lipid Profile: No results for input(s): CHOL, HDL, LDLCALC, TRIG, CHOLHDL, LDLDIRECT in the last 72 hours. Thyroid Function Tests: No results for input(s): TSH, T4TOTAL, FREET4, T3FREE, THYROIDAB in the last 72 hours. Anemia Panel: No results for input(s):  VITAMINB12, FOLATE, FERRITIN, TIBC, IRON, RETICCTPCT in the last 72 hours. Sepsis Labs: No results for input(s): PROCALCITON, LATICACIDVEN in the last 168 hours.  Recent Results (from the past 240 hour(s))  SARS CORONAVIRUS 2 (TAT 6-24 HRS) Nasopharyngeal Nasopharyngeal Swab     Status: None   Collection Time: 10/16/20 11:42 PM   Specimen: Nasopharyngeal Swab  Result Value Ref Range Status   SARS Coronavirus 2 NEGATIVE NEGATIVE Final    Comment: (NOTE) SARS-CoV-2 target nucleic acids are NOT DETECTED.  The SARS-CoV-2 RNA is generally detectable in upper and lower respiratory specimens during the acute phase of infection. Negative results do not preclude SARS-CoV-2 infection, do not rule out co-infections with other pathogens, and should not be used as the sole basis for treatment or other patient management decisions. Negative results must be combined with clinical observations, patient history, and epidemiological information. The expected result is Negative.  Fact Sheet for Patients: SugarRoll.be  Fact Sheet for Healthcare Providers: https://www.woods-mathews.com/  This test is not yet approved or cleared by the [place] FDA and  has been authorized for detection and/or diagnosis of SARS-CoV-2 by FDA under an Emergency Use Authorization (EUA). This EUA will remain  in effect (meaning this test can be used) for the duration of the COVID-19 declaration under Se ction 564(b)(1) of the Act, 21 U.S.C. section 360bbb-3(b)(1), unless the authorization is terminated or revoked sooner.  Performed at [facility], [place] [address] [place], [place] [address]          Radiology Studies: No results found.      Scheduled Meds: . ARIPiprazole  5 mg Oral QHS  . aspirin EC  81 mg Oral Daily  . atorvastatin  40 mg Oral Daily  . enoxaparin (LOVENOX) injection  40 mg Subcutaneous Q24H  . fluticasone  2 spray Each Nare Daily  .  insulin aspart  0-15 Units Subcutaneous TID WC  . insulin aspart  0-5 Units Subcutaneous QHS  . insulin aspart  15 Units Subcutaneous TID WC  . insulin glargine  40 Units Subcutaneous Daily  . metoprolol succinate  25 mg Oral Daily  . potassium chloride  40 mEq Oral Daily  . pregabalin  100 mg Oral TID  . sodium chloride flush  3 mL Intravenous Q12H  . spironolactone  25 mg Oral Daily  . sulfamethoxazole-trimethoprim  1 tablet Oral Q12H  . temazepam  15 mg Oral QHS  . torsemide  20 mg Oral BID  . traZODone  200 mg Oral QHS  . venlafaxine XR  225 mg Oral Daily  Continuous Infusions: . sodium chloride Stopped (10/18/20 1255)  . sodium chloride Stopped (10/19/20 2055)  . levETIRAcetam 1,000 mg (10/25/20 0521)    Assessment & Plan:   Principal Problem:   Acute on chronic diastolic CHF (congestive heart failure) (HCC) Active Problems:   Morbid obesity ([place])   Acute respiratory failure with hypoxia (HCC)   Diabetes mellitus type 2 in obese (HCC)   QT prolongation   Essential hypertension   Hypokalemia   Seizure ([place])   Coronary artery disease due to lipid rich plaque   SVT (supraventricular tachycardia) (HCC)   1-Acute Hypoxic Respiratory Failure: Secondary to Acute Diastolic Heart Failure Exacerbation -Patient presented hypoxic oxygen saturation 70% initially required BiPAP and nonrebreather mask. Lasix and Metolazone>>changed to [facility] 60mg  BID  Clinically slowly improving  3/19-see sodium has dropped will decrease tramadol dose to 20 mg starting in a.m. as he already had 40 mg this a.m.  3/20- Clinically improving , good UO. Lost over 25lbs since admit Continue spironolactone 25 mg daily  Continue Demadex 20 mg twice daily started today Fluid restriction to 1200 cc  I's and O's      2-Partial Seizure -Patient develops right arm tremor/jerking movement. He is alert, answer questions. Per wife patient had this tremors on admission, that was why she brought him here  on Friday, he had episode of urinary incontinence as well. He hasn't had tremors since admission.  -He has been off Effexor, since admission, but per wife he was having this tremors at home and he was taking his effexor.  3/18 EEG was negative  MRI of brain negative for acute changes 3/19-neurology following Continue temazepam 15 mg nightly, pregabalin 100 mg 3 times daily at discharge 3/20-continue Keppra 1000 mg twice daily  Follow-up outpatient with neurology, tapering Keppra to be considered as outpatient pending the resolution of infections, no further clinical events      3-Diabetes type 2 hyperglycemia: A1c 9.2 3/20 -BG stable  Continue current Lantus dose of 40 units and NovoLog 15 units 3 times daily with meals     4-Hypertension:  Stable with beta-blockers and spironolactone     5-Hypokalemia: Hyponatremia- Down to 127 On fluid restriction 1200 Decreased diuretics as above Continue to monitor      6-prolonged QT: PVC: received mg, k replaced.   7-Morbid obesity: diet education.  -LE Cellulitis; on chronic venous stasis.  Patient presents with worsening redness; started Ceftriaxone, and Bactrim.  WBC trending down. Suspect leukocytosis is also elevated secondary to steroids.  Wound care consulted, appreciate assistance.  Doppler: negative for DVT>  Continue on abx  8-SVT/ PAC Cards following.?  Patient with A. fib however very difficult to determine with PACs and runs of SVT. SVT improved with beta-blockers Continue beta-blockers If true A. fib captured on telemetry or ECG, with married anticoagulation    9-COPD; not in acute exacerbation  10.Depression;  abilify/ Temazepam.  Continue with lyrica.    Estimated body mass index is 49.6 kg/m as calculated from the following:   Height as of this encounter: 6\' 2"  (1.88 m).   Weight as of this encounter: 175.2 kg.     DVT prophylaxis: lovenox Code Status:DNR Family Communication: none  at bedside Status is: Inpatient  Remains inpatient appropriate because:Inpatient level of care appropriate due to severity of illness   Dispo: The patient is from: Home              Anticipated d/c is to: Home  Patient currently is not medically stable to d/c.   Difficult to place patient No  Need to monitor electrolytes as they are low.           LOS: 8 days   Time spent: 35 min with >50% on co    Nolberto Hanlon, MD [facility] Pager [phone]  If 7PM-7AM, please contact night-coverage 10/25/2020, 8:27 AM

## 2020-10-26 ENCOUNTER — Telehealth: Payer: Self-pay | Admitting: Cardiology

## 2020-10-26 DIAGNOSIS — I5033 Acute on chronic diastolic (congestive) heart failure: Secondary | ICD-10-CM | POA: Diagnosis not present

## 2020-10-26 LAB — BASIC METABOLIC PANEL
Anion gap: 8 (ref 5–15)
BUN: 16 mg/dL (ref 8–23)
CO2: 29 mmol/L (ref 22–32)
Calcium: 8.7 mg/dL — ABNORMAL LOW (ref 8.9–10.3)
Chloride: 93 mmol/L — ABNORMAL LOW (ref 98–111)
Creatinine, Ser: 0.99 mg/dL (ref 0.61–1.24)
GFR, Estimated: >60 mL/min (ref >60)
Glucose, Bld: 130 mg/dL — ABNORMAL HIGH (ref 70–99)
Potassium: 4.2 mmol/L (ref 3.5–5.1)
Sodium: 130 mmol/L — ABNORMAL LOW (ref 135–145)

## 2020-10-26 LAB — GLUCOSE, CAPILLARY
Glucose-Capillary: 181 mg/dL — ABNORMAL HIGH (ref 70–99)
Glucose-Capillary: 138 mg/dL — ABNORMAL HIGH (ref 70–99)

## 2020-10-26 MED ORDER — METOPROLOL SUCCINATE ER 25 MG PO TB24: 25.0000 mg | ORAL_TABLET | Freq: Every day | ORAL | 0 refills | Status: DC

## 2020-10-26 MED ORDER — ASPIRIN 81 MG PO TBEC: 81.0000 mg | DELAYED_RELEASE_TABLET | Freq: Every day | ORAL | 0 refills | Status: DC

## 2020-10-26 MED ORDER — LEVETIRACETAM 1000 MG PO TABS: 1000.0000 mg | ORAL_TABLET | Freq: Two times a day (BID) | ORAL | 0 refills | Status: DC

## 2020-10-26 MED ORDER — TORSEMIDE 20 MG PO TABS: 20.0000 mg | ORAL_TABLET | Freq: Two times a day (BID) | ORAL | 0 refills | Status: DC

## 2020-10-26 MED ORDER — POTASSIUM CHLORIDE CRYS ER 20 MEQ PO TBCR: 40.0000 meq | EXTENDED_RELEASE_TABLET | Freq: Every day | ORAL | 0 refills | Status: DC

## 2020-10-26 NOTE — Progress Notes (Signed)
Physical Therapy Treatment Patient Details Name: Lawrence [name] MRN: [id number] DOB: 11-15-1955 Today's Date: 10/26/2020    History of Present Illness Patient is a 65 y/o male admitted 10/17/20 with weight gain and SOB. CXR with pulmonary edema. Workup for acute respiratory failure with hypoxia secondary to acute on chronic CHF; also with SVT vs A-fib. Pt with periods of rhythmic jerking and decreased responsiveness; concern for partial seizure. EEG 3/16 within normal limits. Brain MRI 3/17 with no acute abnormality. PMH includes diastolic CHF, COPD, CAD, DM2, PTSD, anxiey, HTN, venous insufficiency.   PT Comments    Pt progressing well with mobility; tolerating increased bouts of activity, ambulating with rollator and intermittent min guard for balance. Pt preparing for d/c home today. Educ re: energy conservation strategies, importance of mobility, activity recommendations, including a walking program.  SpO2 93-95% on RA, HR 108    Follow Up Recommendations  Home health PT;Supervision for mobility/OOB     Equipment Recommendations  None recommended by PT    Recommendations for Other Services       Precautions / Restrictions Precautions Precautions: Fall Precaution Comments: Watch SpO2, HR Restrictions Weight Bearing Restrictions: No    Mobility  Bed Mobility               General bed mobility comments: Received sitting in recliner    Transfers Overall transfer level: Needs assistance Equipment used: 4-wheeled walker Transfers: Sit to/from Stand Sit to Stand: Min guard         General transfer comment: Heavy reliance on UE support to push into standing; increased time and effort  Ambulation/Gait Ambulation/Gait assistance: Min guard Gait Distance (Feet): 150 Feet Assistive device: 4-wheeled walker Gait Pattern/deviations: Step-through pattern;Decreased stride length;Trunk flexed;Wide base of support;Decreased dorsiflexion - right;Antalgic Gait velocity:  Decreased   General Gait Details: Slow, mostly steady gait with rollator and intermittent min guard for balance; pt limited by fatigue and DOE 3/4, but no seated rest break required this session   Stairs             Wheelchair Mobility    Modified Rankin (Stroke Patients Only)       Balance Overall balance assessment: Needs assistance Sitting-balance support: Feet supported Sitting balance-Leahy Scale: Good     Standing balance support: Bilateral upper extremity supported;During functional activity Standing balance-Leahy Scale: Poor Standing balance comment: Reliant on UE support                            Cognition Arousal/Alertness: Awake/alert Behavior During Therapy: WFL for tasks assessed/performed Overall Cognitive Status: Within Functional Limits for tasks assessed                                        Exercises      General Comments General comments (skin integrity, edema, etc.): HR 108, SpO2 93-95% on RA. Pt preparing for d/c home today; educ re: energy conservation (OT provided handout), activity recommendations and walking program      Pertinent Vitals/Pain Pain Assessment: Faces Faces Pain Scale: Hurts a little bit Pain Location: RLE Pain Descriptors / Indicators: Discomfort Pain Intervention(s): Monitored during session    Home Living                      Prior Function            PT  Goals (current goals can now be found in the care plan section) Acute Rehab PT Goals Patient Stated Goal: reports he is ready to walk Progress towards PT goals: Progressing toward goals    Frequency    Min 3X/week      PT Plan Current plan remains appropriate    Co-evaluation              AM-PAC PT "6 Clicks" Mobility   Outcome Measure  Help needed turning from your back to your side while in a flat bed without using bedrails?: A Little Help needed moving from lying on your back to sitting on the side of  a flat bed without using bedrails?: A Little Help needed moving to and from a bed to a chair (including a wheelchair)?: A Little Help needed standing up from a chair using your arms (e.g., wheelchair or bedside chair)?: A Little Help needed to walk in hospital room?: A Little Help needed climbing 3-5 steps with a railing? : A Lot 6 Click Score: 17    End of Session   Activity Tolerance: Patient tolerated treatment well Patient left: in chair;with call bell/phone within reach Nurse Communication: Mobility status PT Visit Diagnosis: Unsteadiness on feet (R26.81);Difficulty in walking, not elsewhere classified (R26.2)     Time: 1434-1450 PT Time Calculation (min) (ACUTE ONLY): 16 min  Charges:  $Therapeutic Exercise: 8-22 mins                     Mabeline Caras, PT, DPT Acute Rehabilitation Services  Pager [phone] Office [place] 10/26/2020, 4:28 PM

## 2020-10-26 NOTE — Progress Notes (Signed)
Occupational Therapy Treatment Patient Details Name: Lawrence [name] MRN: [id number] DOB: June 07, 1956 Today's Date: 10/26/2020    History of present illness Patient is a 65 y/o male admitted 10/17/20 with weight gain and SOB. CXR with pulmonary edema. Workup for acute respiratory failure with hypoxia secondary to acute on chronic CHF; also with SVT vs A-fib. Pt with periods of rhythmic jerking and decreased responsiveness; concern for partial seizure. EEG 3/16 within normal limits. Brain MRI 3/17 with no acute abnormality. PMH includes diastolic CHF, COPD, CAD, DM2, PTSD, anxiey, HTN, venous insufficiency.   OT comments  Pt progressing towards acute OT goals. Discussed energy conservation and compensatory strategies with ADLs. Pt happy to be d/c home today. D/c plan remains appropriate.    Follow Up Recommendations  Home health OT    Equipment Recommendations  None recommended by OT    Recommendations for Other Services      Precautions / Restrictions Precautions Precautions: Fall;Other (comment) Precaution Comments: Watch SpO2, HR Restrictions Weight Bearing Restrictions: No       Mobility Bed Mobility               General bed mobility comments: in recliner at start and end of session. Has hospital bed at home    Transfers Overall transfer level: Needs assistance Equipment used: Rolling walker (2 wheeled) Transfers: Sit to/from Stand Sit to Stand: Min assist         General transfer comment: min A to steady during powerup to stand    Balance Overall balance assessment: Needs assistance Sitting-balance support: Feet supported Sitting balance-Leahy Scale: Good     Standing balance support: Bilateral upper extremity supported;During functional activity Standing balance-Leahy Scale: Poor Standing balance comment: Reliant on UE support                           ADL either performed or assessed with clinical judgement   ADL Overall ADL's : Needs  assistance/impaired                     Lower Body Dressing: Moderate assistance;Sit to/from stand Lower Body Dressing Details (indicate cue type and reason): Pt able to start in seated position. assist to complete in standing position with pt holding rw and total A to pull up LB dressing             Functional mobility during ADLs: Min guard;Rolling walker General ADL Comments: min A to steady on sit to stand. Walked bathroom distance. seated reat break at midpoint of mobility. Pt uses rollator at home. Good insight into deficits and compensatory strategies. Discussed energy conservation during ADLs and provided appropriate handouts.     Vision       Perception     Praxis      Cognition Arousal/Alertness: Awake/alert Behavior During Therapy: WFL for tasks assessed/performed Overall Cognitive Status: Within Functional Limits for tasks assessed                                          Exercises     Shoulder Instructions       General Comments      Pertinent Vitals/ Pain       Pain Assessment: Faces Faces Pain Scale: Hurts little more Pain Location: R knee Pain Descriptors / Indicators: Aching;Sore Pain Intervention(s): Monitored during session;Limited activity within patient's tolerance;Repositioned  Home Living                                          Prior Functioning/Environment              Frequency  Min 2X/week        Progress Toward Goals  OT Goals(current goals can now be found in the care plan section)  Progress towards OT goals: Progressing toward goals  Acute Rehab OT Goals Patient Stated Goal: reports he is ready to walk OT Goal Formulation: With patient/family Time For Goal Achievement: 11/04/20 Potential to Achieve Goals: Good ADL Goals Pt Will Perform Grooming: with modified independence;sitting Pt Will Perform Upper Body Dressing: with modified independence;sitting Pt Will Perform Lower  Body Dressing: with min guard assist;with adaptive equipment;sit to/from stand;with caregiver independent in assisting Pt Will Transfer to Toilet: with supervision;ambulating Pt Will Perform Toileting - Clothing Manipulation and hygiene: with min guard assist;with caregiver independent in assisting;sit to/from stand Additional ADL Goal #1: Pt will self-monitor O2 levels during ADL to maintain saturations >90% throughout and implement stratgies or supplemental O2 as needed  Plan Discharge plan remains appropriate    Co-evaluation                 AM-PAC OT "6 Clicks" Daily Activity     Outcome Measure   Help from another person eating meals?: None Help from another person taking care of personal grooming?: None Help from another person toileting, which includes using toliet, bedpan, or urinal?: A Little Help from another person bathing (including washing, rinsing, drying)?: A Lot Help from another person to put on and taking off regular upper body clothing?: A Little Help from another person to put on and taking off regular lower body clothing?: A Lot 6 Click Score: 18    End of Session Equipment Utilized During Treatment: Rolling walker  OT Visit Diagnosis: Unsteadiness on feet (R26.81);Other abnormalities of gait and mobility (R26.89);Muscle weakness (generalized) (M62.81);Dizziness and giddiness (R42)   Activity Tolerance Patient tolerated treatment well   Patient Left in chair;with call bell/phone within reach   Nurse Communication          Time: 0109-3235 OT Time Calculation (min): 18 min  Charges: OT General Charges $OT Visit: 1 Visit OT Treatments $Self Care/Home Management : 8-22 mins  Tyrone Schimke, OT Acute Rehabilitation Services Pager: [phone] Office: [phone]    Hortencia Pilar 10/26/2020, 1:24 PM

## 2020-10-26 NOTE — Progress Notes (Signed)
D/C instructions given and reviewed. Tele and IV removed, tolerated well. Requesting PTAR for transport. Informed pt his insurance may not pay for this due to pt is ambulatory with walker. He stated "I don't care, I'll pay for it".

## 2020-10-26 NOTE — Care Management Important Message (Signed)
Important Message  Patient Details  Name: Lawrence [name] MRN: [id number] Date of Birth: 1956/05/19   Medicare Important Message Given:  Yes     Shelda Altes 10/26/2020, 10:50 AM

## 2020-10-26 NOTE — TOC Transition Note (Signed)
Transition of Care [facility]) - CM/SW Discharge Note   Patient Details  Name: Lawrence [name] MRN: [id number] Date of Birth: February 13, 1956  [facility]) CM/SW Contact:  Bartholomew Crews, RN Phone Number: [phone] 10/26/2020, 3:16 PM   Clinical Narrative:     Notified by nursing that patient being discharged today and needed stretcher transport. Spoke with patient on his room phone. Verified home address. Verified that someone is home to receive him. Stated that he usually uses [facility] for transportation. Does not have his wheelchair at the hospital. PTAR arranged. Medical transport paperwork sent to printer.   Noted that previous NCM had arranged for RN, PT, OT. [place] at [employer] notified of patient transition home.   No further TOC needs identifed.    Final next level of care: [place] Barriers to Discharge: No Barriers Identified   Patient Goals and CMS Choice Patient states their goals for this hospitalization and ongoing recovery are:: go home CMS Medicare.gov Compare Post Acute Care list provided to:: Patient Choice offered to / list presented to : Patient  Discharge Placement                       Discharge Plan and Services                DME Arranged: N/A DME Agency: NA       HH Arranged: RN,PT,OT [place] Agency: [facility]) (now known as [employer]) Date HH Agency Contacted: 10/26/20 Time [place]: 1515 Representative spoke with at [place]: [place]  Social Determinants of Health ([place]) Interventions     Readmission Risk Interventions Readmission Risk Prevention Plan 12/06/2019  Transportation Screening Complete  PCP or Specialist Appt within 5-7 Days Complete  Home Care Screening Complete  Medication Review (RN CM) Complete  Some recent data might be hidden

## 2020-10-26 NOTE — Telephone Encounter (Signed)
Called to schedule Lawrence [name] for a 2 week hospital f/u per [facility] staff message as well as schedule BMET labs 1 week prior. The follow up has been schedule, but Lawrence [name] is requesting our office reach out to [facility] to arrange them coming out to draw labs when due on 11/02/20. Lawrence [name]'s contact number is [phone] to arrange giving the orders. Please advise.

## 2020-10-26 NOTE — Discharge Summary (Signed)
Lawrence [name] ION:[id number] DOB: [birth year] DOA: 10/16/2020  PCP: Lawrence Reichmann, PA-C (Inactive)  Admit date: 10/16/2020 Discharge date: 10/26/2020  Admitted From: Home Disposition: Home  Recommendations for Outpatient Follow-up:  1. Follow up with PCP in 1 week 2. Please obtain BMP/CBC in one week 3. Cardiology in 1 week 4. Follow-up with heart failure clinic 5. Follow-up with outpatient neurology in 1 week  Home Health: Yes   Discharge Condition:Stable CODE STATUS: DNR Diet recommendation: Heart Healthy/carb control  Brief/Interim Summary: Per KGM:WNUUVOZ Lawrence [name] is a 65 y.o. male with medical history significant for  hypertension, diabetes, hyperlipidemia, COPD, diastolic heart failure, venous stasis who presents by EMS with complaint of shortness of breath. He had been having episodic shortness of breath for the last 2-3 days, but it can more SOB  He denied chest pain, heaviness, tightness, pressure. Unfortunately, he is a very poor historian. He does state that he weighs himself daily and has been gaining weight but cannot tell me how much weight he has gained. He reportedly had hypoxia and oxygen saturation in the 70s when EMS initially evaluated him. He was placed on a nonrebreather mask with 100% oxygen with no improvement in oxygen saturation. He was placed on CPAP and oxygen saturation improved to the 90s. He was given methylprednisolone and magnesium en route to the hospital. He does state he has had trouble sleeping the past few nights due to being SOB if he laid down.  ED Course: CXR shows pulmonary edema. Pt was given lasix in the ER. Initial troponin was 14.   Cardiology was consulted.  Exam point during his hospitalization he developed right arm tremors/jerking movement and neurology was consulted for possible seizure.   1-Acute Hypoxic Respiratory Failure: Secondary to Acute Diastolic Heart Failure Exacerbation -Patient presented hypoxic oxygen saturation  70% initially required BiPAP and nonrebreather mask. Lasix and Metolazone>>changed to Demadex 60mg  BID  Clinically improved.  Demadex dose decreased due to drop in Na level.  Lost >25lbs Per cardiology:Medication Recommendations:  ASA 81mg  daily, atorvastatin 40mg  daily, Toprol XL 25mg  daily, spironolactone 25mg  daily, Torsemide 20mg  BID, Kdur 51meq daily Other recommendations (labs, testing, etc):  BMET in 1 week Follow up as an outpatient:  Followup 2 weeks       2-Partial Seizure -Patient develops right arm tremor/jerking movement. -He has been off Effexor, since admission, but per wife he was having this tremors at home and he was taking his effexor.  3/18 EEG was negative  MRI of brain negative for acute changes Neurology was following:Continue temazepam 15 mg nightly, pregabalin 100 mg 3 times daily at discharge, continue Keppra 1000 mg twice daily  Follow-up outpatient with neurology, tapering Keppra to be considered as outpatient pending the resolution of infections, no further clinical events      3-Diabetes type 2 hyperglycemia: A1c 9.2 BG were elevated during hospitalization, with adjustments of insulin,it improved. He will need to f/u with pcp for outpatient close management of his diabetes.    4-Hypertension:  Stable with beta-blockers and spironolactone     5-Hypokalemia: Hyponatremia- Na dropped to 127, asx. This am Na up to 130 Continue fluid restriction 1200 Will need to f/u with pcp for further management and blood work  6-prolonged QT:PVC: received mg, k replaced.   7-Morbid obesity: diet education.  -LE Cellulitis; on chronic venous stasis. Patient presents with worsening redness; started Ceftriaxone,andBactrim.   Wound care consulted Doppler: negative for DVT> Completed antibiotic course.   8-SVT/PAC Cards following.?  Patient with A.  fib however very difficult to determine with PACs and runs of SVT.Asked patient to bring in  home monitor that was reportedly detecting Afib. SVT improved with beta-blockers Continue beta-blockers If true A. fib captured on telemetry or ECG, with married anticoagulation    9-COPD; not in acute exacerbation  10.Depression;  abilify/ Temazepam.  Continue with lyrica.   Discharge Diagnoses:  Principal Problem:   Acute on chronic diastolic CHF (congestive heart failure) (HCC) Active Problems:   Morbid obesity ([place])   Acute respiratory failure with hypoxia (HCC)   Diabetes mellitus type 2 in obese (HCC)   QT prolongation   Essential hypertension   Hypokalemia   Seizure ([place])   Coronary artery disease due to lipid rich plaque   SVT (supraventricular tachycardia) [facility])    Discharge Instructions  Discharge Instructions    Ambulatory referral to Neurology   Complete by: As directed    An appointment is requested in approximately: 4-6 wks   Call MD for:  difficulty breathing, headache or visual disturbances   Complete by: As directed    Diet - low sodium heart healthy   Complete by: As directed    Discharge instructions   Complete by: As directed    With primary this week to discuss diabetes medication adjustments and need blood work   Discharge wound care:   Complete by: As directed    As above   Increase activity slowly   Complete by: As directed      Allergies as of 10/26/2020      Reactions   Vancomycin Other (See Comments)   "Red Man Syndrome" 02/02/17: possible cause for rash under both arms   Niacin And Related Other (See Comments)   Red man syndrome   Tubersol [tuberculin] Other (See Comments)   Reaction unknown   Doxycycline Rash      Medication List    STOP taking these medications   furosemide 80 MG tablet Commonly known as: Lasix   lisinopril 20 MG tablet Commonly known as: ZESTRIL   loperamide 2 MG capsule Commonly known as: IMODIUM   LORazepam 1 MG tablet Commonly known as: ATIVAN   prazosin 2 MG capsule Commonly known as:  MINIPRESS   propranolol 10 MG tablet Commonly known as: INDERAL     TAKE these medications   acetaminophen 650 MG CR tablet Commonly known as: TYLENOL Take 1,300 mg by mouth every 8 (eight) hours as needed for pain.   albuterol 108 (90 Base) MCG/ACT inhaler Commonly known as: VENTOLIN HFA Inhale into the lungs.   allopurinol 100 MG tablet Commonly known as: ZYLOPRIM Take 100 mg by mouth 2 (two) times daily.   ARIPiprazole 5 MG tablet Commonly known as: ABILIFY Take 5 mg by mouth at bedtime.   aspirin 81 MG EC tablet Take 1 tablet (81 mg total) by mouth daily. Swallow whole. Start taking on: October 27, 2020   atorvastatin 40 MG tablet Commonly known as: LIPITOR TAKE ONE TABLET BY MOUTH DAILY AT 6PM   blood glucose meter kit and supplies Dispense based on patient and insurance preference. Use up to four times daily as directed. (FOR ICD-10 E10.9, E11.9).   cetirizine 10 MG tablet Commonly known as: ZYRTEC Take 10 mg by mouth at bedtime.   ferrous sulfate 324 (65 Fe) MG Tbec Take 324 mg by mouth daily with breakfast.   fluticasone 50 MCG/ACT nasal spray Commonly known as: FLONASE Place 2 sprays into both nostrils daily.   Lantus SoloStar 100 UNIT/ML Solostar  Pen Generic drug: insulin glargine Inject 3 Units into the skin daily.   levETIRAcetam 1000 MG tablet Commonly known as: Keppra Take 1 tablet (1,000 mg total) by mouth 2 (two) times daily.   metFORMIN 1000 MG tablet Commonly known as: GLUCOPHAGE Take 1,000 mg by mouth 2 (two) times daily with a meal.   metoprolol succinate 25 MG 24 hr tablet Commonly known as: TOPROL-XL Take 1 tablet (25 mg total) by mouth daily. Start taking on: October 27, 2020 What changed:   medication strength  how much to take  when to take this  additional instructions   omeprazole 20 MG capsule Commonly known as: PRILOSEC Take 20 mg by mouth every morning.   oxyCODONE 5 MG immediate release tablet Commonly known as:  Oxy IR/ROXICODONE Take 5 mg by mouth every 8 (eight) hours as needed.   potassium chloride SA 20 MEQ tablet Commonly known as: KLOR-CON Take 2 tablets (40 mEq total) by mouth daily. Start taking on: October 27, 2020   pregabalin 100 MG capsule Commonly known as: LYRICA Take 100 mg by mouth in the morning, at noon, and at bedtime.   silver sulfADIAZINE 1 % cream Commonly known as: SILVADENE Apply 1 application topically See admin instructions. Apply as directed once a day as directed to bilateral legs   spironolactone 25 MG tablet Commonly known as: ALDACTONE Take 1 tablet (25 mg total) by mouth daily.   temazepam 15 MG capsule Commonly known as: RESTORIL Take 15 mg by mouth at bedtime.   torsemide 20 MG tablet Commonly known as: DEMADEX Take 1 tablet (20 mg total) by mouth 2 (two) times daily.   traZODone 100 MG tablet Commonly known as: DESYREL Take 200 mg by mouth at bedtime.   Trulicity 3 QM/5.7QI Sopn Generic drug: Dulaglutide Inject 3 mg into the skin once a week.   venlafaxine XR 75 MG 24 hr capsule Commonly known as: EFFEXOR-XR Take 225 mg by mouth daily with breakfast.   Vitamin D (Ergocalciferol) 1.25 MG (50000 UNIT) Caps capsule Commonly known as: DRISDOL Take 50,000 Units by mouth every 7 (seven) days.            Discharge Care Instructions  (From admission, onward)         Start     Ordered   10/26/20 0000  Discharge wound care:       Comments: As above   10/26/20 1340          Follow-up Information    [facility].   Specialty: Home Health Services Why: [facility] Contact information: [address] [place] [address] [phone]        Sueanne Margarita, MD Follow up in 1 week(s).   Specialty: Cardiology Contact information: [address] [place] [address] [phone]        Fernande Boyden, DC .   Contact information: [place]  [address] [phone]        Lawrence Reichmann, PA-C Follow up in 1 week(s).   Specialty: Physician Assistant             Allergies  Allergen Reactions  . Vancomycin Other (See Comments)    "Red Man Syndrome" 02/02/17: possible cause for rash under both arms  . Niacin And Related Other (See Comments)    Red man syndrome  . Tubersol [Tuberculin] Other (See Comments)    Reaction unknown  . Doxycycline Rash  Consultations:  Cardiology, neurology   Procedures/Studies: MR BRAIN WO CONTRAST  Result Date: 10/22/2020 CLINICAL DATA:  Seizure, nontraumatic. EXAM: MRI HEAD WITHOUT CONTRAST TECHNIQUE: Multiplanar, multiecho pulse sequences of the brain and surrounding structures were obtained without intravenous contrast. COMPARISON:  MRI of the brain October 20, 2020 FINDINGS: The study is partially degraded by motion. Brain: No acute infarction, hemorrhage, hydrocephalus, extra-axial collection or mass lesion. Vascular: Normal flow voids. Skull and upper cervical spine: Normal marrow signal. Sinuses/Orbits: Negative. IMPRESSION: No acute intracranial abnormality identified. Electronically Signed   By: Pedro Earls M.D.   On: 10/22/2020 12:20   MR BRAIN WO CONTRAST  Result Date: 10/21/2020 CLINICAL DATA:  Initial evaluation for acute seizure. EXAM: MRI HEAD WITHOUT CONTRAST TECHNIQUE: Multiplanar, multiecho pulse sequences of the brain and surrounding structures were obtained without intravenous contrast. COMPARISON:  None. FINDINGS: Brain: Examination severely limited by extensive motion artifact and patient's inability to tolerate the full length of the study. Cerebral volume within normal limits for age. No definite focal parenchymal signal abnormality. No abnormal foci of restricted diffusion to suggest acute or subacute ischemia. Gray-white matter differentiation maintained. No encephalomalacia to suggest chronic cortical infarction. No definite evidence for acute or  chronic intracranial hemorrhage. No visible mass lesion, mass effect or midline shift. No hydrocephalus or extra-axial fluid collection. Pituitary gland suprasellar region grossly normal. Midline structures intact. Vascular: Major intracranial vascular flow voids are maintained. Skull and upper cervical spine: Craniocervical junction grossly within normal limits. Bone marrow signal intensity grossly normal. No visible scalp soft tissue abnormality. Sinuses/Orbits: Globes and orbital soft tissues within normal limits. Paranasal sinuses are largely clear. No significant mastoid effusion. Other: None. IMPRESSION: 1. Technically limited exam due to extensive motion artifact and patient's inability to tolerate the full length of the study. 2. Grossly negative brain MRI. No acute intracranial abnormality identified. Electronically Signed   By: Jeannine Boga M.D.   On: 10/21/2020 02:20   DG Chest Port 1 View  Result Date: 10/17/2020 CLINICAL DATA:  Shortness of breath EXAM: PORTABLE CHEST 1 VIEW COMPARISON:  Chest x-ray 02/04/2020, CT angio chest 09/09/2019 FINDINGS: Similar appearing cardiomegaly. The heart size and mediastinal contours are unchanged. Streaky airspace opacity of the lung bases. Increased interstitial markings. No pleural effusion. No pneumothorax. No acute osseous abnormality. IMPRESSION: Pulmonary edema versus bronchitis. Electronically Signed   By: Iven Finn M.D.   On: 10/17/2020 00:00   EEG adult  Result Date: 10/21/2020 Lora Havens, MD     10/21/2020  9:11 AM Patient Name: Lawrence [name] MRN: [id number] Epilepsy Attending: Lora Havens Referring Physician/Provider: Dr. Linus Mako Date: 10/20/2020 Duration: 45 mins Patient history: 65 yo man w/ no previous seizure history p/w 2 witnessed events in 4 days concerning for partial seizure with impaired awareness.  EEG to evaluate for seizure. Level of alertness: Awake AEDs during EEG study: Keppra, pregabalin, temazepam  Technical aspects: This EEG study was done with scalp electrodes positioned according to the 10-20 International system of electrode placement. Electrical activity was acquired at a sampling rate of _0  and reviewed with a high frequency filter of _1  and a low frequency filter of _2 . EEG data were recorded continuously and digitally stored. Description: The posterior dominant rhythm consists of 8 Hz activity of moderate voltage (25-35 uV) seen predominantly in posterior head regions, symmetric and reactive to eye opening and eye closing. Physiologic photic driving was not seen during photic stimulation.  Hyperventilation was not performed.   IMPRESSION: This study  is within normal limits. No seizures or epileptiform discharges were seen throughout the recording. Lora Havens   ECHOCARDIOGRAM COMPLETE  Result Date: 10/17/2020    ECHOCARDIOGRAM REPORT   Patient Name:   Lawrence [name] Date of Exam: 10/17/2020 Medical Rec #:  [id number]      Height:       74.0 in Accession #:    [id number]     Weight:       420.0 lb Date of Birth:  1956-06-02      BSA:          2.979 m Patient Age:    65 years       BP:           124/71 mmHg Patient Gender: M              HR:           120 bpm. Exam Location:  Inpatient Procedure: 2D Echo, Cardiac Doppler and Color Doppler Indications:    CHF  History:        Patient has prior history of Echocardiogram examinations, most                 recent 03/08/2019. CHF, Arrythmias:Atrial Fibrillation,                 Signs/Symptoms:Shortness of Breath; Risk Factors:Diabetes and                 Hypertension.  Sonographer:    [place] Referring Phys: [id number] [place]  1. Left ventricular ejection fraction, by estimation, is 60 to 65%. The left ventricle has normal function. The left ventricle has no regional wall motion abnormalities. Left ventricular diastolic function could not be evaluated.  2. Right ventricular systolic function is normal. The right  ventricular size is normal. Tricuspid regurgitation signal is inadequate for assessing PA pressure.  3. The mitral valve is grossly normal. No evidence of mitral valve regurgitation. No evidence of mitral stenosis.  4. The aortic valve is calcified. Aortic valve regurgitation is not visualized. Moderate aortic valve stenosis. Aortic valve area, by VTI measures 1.38 cm. Aortic valve mean gradient measures 19.2 mmHg. Aortic valve Vmax measures 3.05 m/s. Comparison(s): Changes from prior study are noted. AFib is now present. Aortic stenosis is now moderate. FINDINGS  Left Ventricle: Left ventricular ejection fraction, by estimation, is 60 to 65%. The left ventricle has normal function. The left ventricle has no regional wall motion abnormalities. The left ventricular internal cavity size was normal in size. There is  no left ventricular hypertrophy. Left ventricular diastolic function could not be evaluated due to atrial fibrillation. Left ventricular diastolic function could not be evaluated. Right Ventricle: The right ventricular size is normal. No increase in right ventricular wall thickness. Right ventricular systolic function is normal. Tricuspid regurgitation signal is inadequate for assessing PA pressure. Left Atrium: Left atrial size was normal in size. Right Atrium: Right atrial size was normal in size. Pericardium: Trivial pericardial effusion is present. Presence of pericardial fat pad. Mitral Valve: The mitral valve is grossly normal. No evidence of mitral valve regurgitation. No evidence of mitral valve stenosis. Tricuspid Valve: The tricuspid valve is grossly normal. Tricuspid valve regurgitation is not demonstrated. No evidence of tricuspid stenosis. Aortic Valve: The aortic valve is calcified. Aortic valve regurgitation is not visualized. Moderate aortic stenosis is present. Aortic valve mean gradient measures 19.2 mmHg. Aortic valve peak gradient measures 37.1 mmHg. Aortic valve area, by VTI measures  1.38 cm. Pulmonic Valve: The pulmonic valve was grossly normal. Pulmonic valve regurgitation is not visualized. No evidence of pulmonic stenosis. Aorta: The aortic root and ascending aorta are structurally normal, with no evidence of dilitation. Venous: The inferior vena cava was not well visualized. IAS/Shunts: The atrial septum is grossly normal. EKG: Rhythm strip during this exam demostrated atrial fibrillation.  LEFT VENTRICLE PLAX 2D LVIDd:         5.30 cm     Diastology LVIDs:         2.80 cm     LV e' medial:    10.20 cm/s LV PW:         1.40 cm     LV E/e' medial:  6.5 LV IVS:        0.90 cm     LV e' lateral:   4.68 cm/s LVOT diam:     2.40 cm     LV E/e' lateral: 14.3 LV SV:         75 LV SV Index:   25 LVOT Area:     4.52 cm  LV Volumes (MOD) LV vol d, MOD A2C: 60.6 ml LV vol d, MOD A4C: 78.5 ml LV vol s, MOD A2C: 23.1 ml LV vol s, MOD A4C: 25.7 ml LV SV MOD A2C:     37.5 ml LV SV MOD A4C:     78.5 ml LV SV MOD BP:      42.3 ml RIGHT VENTRICLE RV S prime:     19.90 cm/s TAPSE (M-mode): 1.8 cm LEFT ATRIUM             Index       RIGHT ATRIUM           Index LA Vol (A2C):   25.4 ml 8.53 ml/m  RA Area:     28.30 cm LA Vol (A4C):   94.8 ml 31.82 ml/m RA Volume:   94.20 ml  31.62 ml/m LA Biplane Vol: 50.1 ml 16.82 ml/m  AORTIC VALVE                    PULMONIC VALVE AV Area (Vmax):    1.41 cm     PV Vmax:       0.67 m/s AV Area (Vmean):   1.50 cm     PV Vmean:      57.250 cm/s AV Area (VTI):     1.38 cm     PV VTI:        0.202 m AV Vmax:           304.60 cm/s  PV Peak grad:  1.8 mmHg AV Vmean:          197.800 cm/s PV Mean grad:  1.5 mmHg AV VTI:            0.541 m AV Peak Grad:      37.1 mmHg AV Mean Grad:      19.2 mmHg LVOT Vmax:         95.23 cm/s LVOT Vmean:        65.500 cm/s LVOT VTI:          0.165 m LVOT/AV VTI ratio: 0.31  AORTA Ao Root diam: 3.90 cm Ao Asc diam:  3.60 cm MITRAL VALVE MV Area (PHT): 4.68 cm    SHUNTS MV Decel Time: 162 msec    Systemic VTI:  0.17 m MV E velocity: 66.70  cm/s  Systemic Diam: 2.40 cm MV A velocity:  77.70 cm/s MV E/A ratio:  0.86 Eleonore Chiquito MD Electronically signed by Eleonore Chiquito MD Signature Date/Time: 10/17/2020/2:18:46 PM    Final    VAS [place] LOWER EXTREMITY VENOUS (DVT)  Result Date: 10/20/2020  Lower Venous DVT Study Indications: Swelling. Other Indications: S/P RT GSV ablation from proximal thigh to calf. Comparison Study: 12-05-2019 Lower extermity venous RT study was negative for                   DVT. Performing Technologist: Darlin Coco RDMS,RVT  Examination Guidelines: A complete evaluation includes B-mode imaging, spectral Doppler, color Doppler, and power Doppler as needed of all accessible portions of each vessel. Bilateral testing is considered an integral part of a complete examination. Limited examinations for reoccurring indications may be performed as noted. The reflux portion of the exam is performed with the patient in reverse Trendelenburg.  +---------+---------------+---------+-----------+----------+-------------------+ RIGHT    CompressibilityPhasicitySpontaneityPropertiesThrombus Aging      +---------+---------------+---------+-----------+----------+-------------------+ CFV      Full           Yes      Yes                                      +---------+---------------+---------+-----------+----------+-------------------+ SFJ      Full                                                             +---------+---------------+---------+-----------+----------+-------------------+ FV Prox  Full                                                             +---------+---------------+---------+-----------+----------+-------------------+ FV Mid   Full                                                             +---------+---------------+---------+-----------+----------+-------------------+ FV DistalFull                                                              +---------+---------------+---------+-----------+----------+-------------------+ PFV      Full                                                             +---------+---------------+---------+-----------+----------+-------------------+ POP      Full           Yes      Yes                                      +---------+---------------+---------+-----------+----------+-------------------+  PTV                     Yes      Yes                                      +---------+---------------+---------+-----------+----------+-------------------+ PERO                                                  Not well visualized +---------+---------------+---------+-----------+----------+-------------------+   +---------+---------------+---------+-----------+----------+-------------------+ LEFT     CompressibilityPhasicitySpontaneityPropertiesThrombus Aging      +---------+---------------+---------+-----------+----------+-------------------+ CFV      Full           Yes      Yes                                      +---------+---------------+---------+-----------+----------+-------------------+ SFJ      Full                                                             +---------+---------------+---------+-----------+----------+-------------------+ FV Prox  Full                                                             +---------+---------------+---------+-----------+----------+-------------------+ FV Mid   Full                                                             +---------+---------------+---------+-----------+----------+-------------------+ FV DistalFull                                                             +---------+---------------+---------+-----------+----------+-------------------+ PFV      Full                                                             +---------+---------------+---------+-----------+----------+-------------------+  POP      Full           Yes      Yes                                      +---------+---------------+---------+-----------+----------+-------------------+ PTV  Yes      Yes                                      +---------+---------------+---------+-----------+----------+-------------------+ PERO                                                  Not well visualized +---------+---------------+---------+-----------+----------+-------------------+     Summary: RIGHT: - There is no evidence of deep vein thrombosis in the lower extremity. However, portions of this examination were limited- see technologist comments above.  - No cystic structure found in the popliteal fossa.  LEFT: - There is no evidence of deep vein thrombosis in the lower extremity. However, portions of this examination were limited- see technologist comments above.  - No cystic structure found in the popliteal fossa.  *See table(s) above for measurements and observations. Electronically signed by [name] MD on 10/20/2020 at 3:47:37 PM.    Final        Subjective: No complaints. Ready to go home. Overall feels much better.  Discharge Exam: Vitals:   10/26/20 0823 10/26/20 1141  BP: 132/78 (!) 121/102  Pulse: 94 85  Resp: 15 18  Temp: 97.8 F (36.6 C) 98 F (36.7 C)  SpO2: 93%    Vitals:   10/26/20 0031 10/26/20 0428 10/26/20 0823 10/26/20 1141  BP: 126/80 (!) 145/83 132/78 (!) 121/102  Pulse: 86 87 94 85  Resp: (!) _0 Temp: 97.6 F (36.4 C) 97.8 F (36.6 C) 97.8 F (36.6 C) 98 F (36.7 C)  TempSrc: Oral Oral Oral Oral  SpO2: 92% 93% 93%   Weight:  (!) 180.4 kg    Height:        General: Pt is alert, awake, not in acute distress Cardiovascular: RRR, S1/S2 +, no rubs, no gallops Respiratory: CTA bilaterally, no wheezing, no rhonchi Abdominal: Soft, NT, ND, bowel sounds + Extremities: chronic skin changes. +edema, overall mproved    The results of  significant diagnostics from this hospitalization (including imaging, microbiology, ancillary and laboratory) are listed below for reference.     Microbiology: Recent Results (from the past 240 hour(s))  SARS CORONAVIRUS 2 (TAT 6-24 HRS) Nasopharyngeal Nasopharyngeal Swab     Status: None   Collection Time: 10/16/20 11:42 PM   Specimen: Nasopharyngeal Swab  Result Value Ref Range Status   SARS Coronavirus 2 NEGATIVE NEGATIVE Final    Comment: (NOTE) SARS-CoV-2 target nucleic acids are NOT DETECTED.  The SARS-CoV-2 RNA is generally detectable in upper and lower respiratory specimens during the acute phase of infection. Negative results do not preclude SARS-CoV-2 infection, do not rule out co-infections with other pathogens, and should not be used as the sole basis for treatment or other patient management decisions. Negative results must be combined with clinical observations, patient history, and epidemiological information. The expected result is Negative.  Fact Sheet for Patients: SugarRoll.be  Fact Sheet for Healthcare Providers: https://www.woods-mathews.com/  This test is not yet approved or cleared by the [place] FDA and  has been authorized for detection and/or diagnosis of SARS-CoV-2 by FDA under an Emergency Use Authorization (EUA). This EUA will remain  in effect (meaning this test can be used) for the duration of the COVID-19 declaration under Se ction 564(b)(1) of  the Act, 21 U.S.C. section 360bbb-3(b)(1), unless the authorization is terminated or revoked sooner.  Performed at [facility], [place] [address], [place], [place] [address]      Labs: BNP (last 3 results) Recent Labs    02/04/20 2120 10/16/20 2343 10/22/20 0345  BNP 49.8 61.4 01.7   Basic Metabolic Panel: Recent Labs  Lab 10/21/20 0433 10/22/20 0345 10/23/20 0423 10/23/20 1526 10/24/20 0253 10/25/20 0227 10/26/20 0348  NA 129* 128*  129*  --  127* 127* 130*  K 4.3 3.9 4.0  --  4.4 4.3 4.2  CL 90* 89* 88*  --  91* 91* 93*  CO2 _0 --  _1 GLUCOSE 234* 189* 172*  --  143* 164* 130*  BUN 22 25* 29*  --  29* 23 16  CREATININE 1.01 1.14 1.27* 1.29* 1.11 1.01 0.99  CALCIUM 9.2 9.1 9.0  --  8.7* 8.6* 8.7*  MG 2.1  --   --   --   --   --   --    Liver Function Tests: No results for input(s): AST, ALT, ALKPHOS, BILITOT, PROT, ALBUMIN in the last 168 hours. No results for input(s): LIPASE, AMYLASE in the last 168 hours. No results for input(s): AMMONIA in the last 168 hours. CBC: Recent Labs  Lab 10/20/20 0344 10/21/20 0433 10/23/20 1526 10/25/20 0227  WBC 12.5* 15.4* 17.6* 11.9*  HGB 13.3 14.4 14.4 13.1  HCT 39.8 42.7 44.5 39.6  MCV 87.1 86.1 87.9 87.0  PLT 312 351 403* 351   Cardiac Enzymes: No results for input(s): CKTOTAL, CKMB, CKMBINDEX, TROPONINI in the last 168 hours. BNP: Invalid input(s): POCBNP CBG: Recent Labs  Lab 10/25/20 1206 10/25/20 1526 10/25/20 2108 10/26/20 0557 10/26/20 1140  GLUCAP 151* 105* 135* 181* 138*   D-Dimer No results for input(s): DDIMER in the last 72 hours. Hgb A1c No results for input(s): HGBA1C in the last 72 hours. Lipid Profile No results for input(s): CHOL, HDL, LDLCALC, TRIG, CHOLHDL, LDLDIRECT in the last 72 hours. Thyroid function studies No results for input(s): TSH, T4TOTAL, T3FREE, THYROIDAB in the last 72 hours.  Invalid input(s): FREET3 Anemia work up No results for input(s): VITAMINB12, FOLATE, FERRITIN, TIBC, IRON, RETICCTPCT in the last 72 hours. Urinalysis    Component Value Date/Time   COLORURINE YELLOW 12/04/2019 1839   APPEARANCEUR CLEAR 12/04/2019 1839   LABSPEC 1.014 12/04/2019 1839   PHURINE 6.0 12/04/2019 1839   GLUCOSEU NEGATIVE 12/04/2019 1839   HGBUR NEGATIVE 12/04/2019 1839   BILIRUBINUR NEGATIVE 12/04/2019 1839   KETONESUR NEGATIVE 12/04/2019 1839   PROTEINUR NEGATIVE 12/04/2019 1839   NITRITE NEGATIVE 12/04/2019  1839   LEUKOCYTESUR NEGATIVE 12/04/2019 1839   Sepsis Labs Invalid input(s): PROCALCITONIN,  WBC,  LACTICIDVEN Microbiology Recent Results (from the past 240 hour(s))  SARS CORONAVIRUS 2 (TAT 6-24 HRS) Nasopharyngeal Nasopharyngeal Swab     Status: None   Collection Time: 10/16/20 11:42 PM   Specimen: Nasopharyngeal Swab  Result Value Ref Range Status   SARS Coronavirus 2 NEGATIVE NEGATIVE Final    Comment: (NOTE) SARS-CoV-2 target nucleic acids are NOT DETECTED.  The SARS-CoV-2 RNA is generally detectable in upper and lower respiratory specimens during the acute phase of infection. Negative results do not preclude SARS-CoV-2 infection, do not rule out co-infections with other pathogens, and should not be used as the sole basis for treatment or other patient management decisions. Negative results must be combined with clinical observations, patient history, and  epidemiological information. The expected result is Negative.  Fact Sheet for Patients: SugarRoll.be  Fact Sheet for Healthcare Providers: https://www.woods-mathews.com/  This test is not yet approved or cleared by the [place] FDA and  has been authorized for detection and/or diagnosis of SARS-CoV-2 by FDA under an Emergency Use Authorization (EUA). This EUA will remain  in effect (meaning this test can be used) for the duration of the COVID-19 declaration under Se ction 564(b)(1) of the Act, 21 U.S.C. section 360bbb-3(b)(1), unless the authorization is terminated or revoked sooner.  Performed at [facility], [place] [address] [place], [place] [address]      Time coordinating discharge: Over 30 minutes  SIGNED:   Nolberto Hanlon, MD  [facility] 10/26/2020, 1:42 PM Pager   If 7PM-7AM, please contact night-coverage [email or web address] Password [email or web address]

## 2020-10-26 NOTE — Telephone Encounter (Signed)
[facility] is not longer set up with [facility] for home visit (labs done etc). I will send note to [facility] for Richardson Dopp, PAC.

## 2020-10-26 NOTE — Plan of Care (Signed)

## 2020-10-27 DIAGNOSIS — I130 Hypertensive heart and chronic kidney disease with heart failure and stage 1 through stage 4 chronic kidney disease, or unspecified chronic kidney disease: Secondary | ICD-10-CM | POA: Diagnosis not present

## 2020-10-27 DIAGNOSIS — N183 Chronic kidney disease, stage 3 unspecified: Secondary | ICD-10-CM | POA: Diagnosis not present

## 2020-10-27 DIAGNOSIS — G4733 Obstructive sleep apnea (adult) (pediatric): Secondary | ICD-10-CM | POA: Diagnosis not present

## 2020-10-27 DIAGNOSIS — M109 Gout, unspecified: Secondary | ICD-10-CM | POA: Diagnosis not present

## 2020-10-27 DIAGNOSIS — I2510 Atherosclerotic heart disease of native coronary artery without angina pectoris: Secondary | ICD-10-CM | POA: Diagnosis not present

## 2020-10-27 DIAGNOSIS — L97812 Non-pressure chronic ulcer of other part of right lower leg with fat layer exposed: Secondary | ICD-10-CM | POA: Diagnosis not present

## 2020-10-27 DIAGNOSIS — E1140 Type 2 diabetes mellitus with diabetic neuropathy, unspecified: Secondary | ICD-10-CM | POA: Diagnosis not present

## 2020-10-27 DIAGNOSIS — J449 Chronic obstructive pulmonary disease, unspecified: Secondary | ICD-10-CM | POA: Diagnosis not present

## 2020-10-27 DIAGNOSIS — G4700 Insomnia, unspecified: Secondary | ICD-10-CM | POA: Diagnosis not present

## 2020-10-27 DIAGNOSIS — L97822 Non-pressure chronic ulcer of other part of left lower leg with fat layer exposed: Secondary | ICD-10-CM | POA: Diagnosis not present

## 2020-10-27 DIAGNOSIS — I5033 Acute on chronic diastolic (congestive) heart failure: Secondary | ICD-10-CM | POA: Diagnosis not present

## 2020-10-27 DIAGNOSIS — E1122 Type 2 diabetes mellitus with diabetic chronic kidney disease: Secondary | ICD-10-CM | POA: Diagnosis not present

## 2020-10-27 DIAGNOSIS — I872 Venous insufficiency (chronic) (peripheral): Secondary | ICD-10-CM | POA: Diagnosis not present

## 2020-10-27 DIAGNOSIS — E1151 Type 2 diabetes mellitus with diabetic peripheral angiopathy without gangrene: Secondary | ICD-10-CM | POA: Diagnosis not present

## 2020-10-27 DIAGNOSIS — Z79899 Other long term (current) drug therapy: Secondary | ICD-10-CM | POA: Diagnosis not present

## 2020-10-27 DIAGNOSIS — Z794 Long term (current) use of insulin: Secondary | ICD-10-CM | POA: Diagnosis not present

## 2020-10-27 DIAGNOSIS — I471 Supraventricular tachycardia: Secondary | ICD-10-CM | POA: Diagnosis not present

## 2020-10-27 DIAGNOSIS — E785 Hyperlipidemia, unspecified: Secondary | ICD-10-CM | POA: Diagnosis not present

## 2020-10-27 DIAGNOSIS — Z4800 Encounter for change or removal of nonsurgical wound dressing: Secondary | ICD-10-CM | POA: Diagnosis not present

## 2020-10-27 NOTE — Telephone Encounter (Signed)
It looks like he was set up with home health RN after DC from the hospital.  He may be referring to that.  If so have his HHRN draw his BMET and fax to Dr. Radford Pax. Otherwise, he will need to come in to the office to have it drawn. Richardson Dopp, PA-C    10/27/2020 5:18 PM

## 2020-10-28 NOTE — Telephone Encounter (Signed)
Spoke with the patient and said it is very difficult for him to get out and he has transportation issues. When he was discharged from the hospital he was set up with [facility].  I contacted [facility] and representative told me that they are able to do lab draws however the patient's account is currently on hold so I would need to speak with the clinical manager. She transferred me to the clinical manager, Burman Nieves. I have left a message for Burman Nieves to call me back.

## 2020-10-29 DIAGNOSIS — I5033 Acute on chronic diastolic (congestive) heart failure: Secondary | ICD-10-CM | POA: Diagnosis not present

## 2020-10-29 DIAGNOSIS — Z79899 Other long term (current) drug therapy: Secondary | ICD-10-CM | POA: Diagnosis not present

## 2020-10-29 DIAGNOSIS — G4700 Insomnia, unspecified: Secondary | ICD-10-CM | POA: Diagnosis not present

## 2020-10-29 DIAGNOSIS — E1140 Type 2 diabetes mellitus with diabetic neuropathy, unspecified: Secondary | ICD-10-CM | POA: Diagnosis not present

## 2020-10-29 DIAGNOSIS — G4733 Obstructive sleep apnea (adult) (pediatric): Secondary | ICD-10-CM | POA: Diagnosis not present

## 2020-10-29 DIAGNOSIS — J449 Chronic obstructive pulmonary disease, unspecified: Secondary | ICD-10-CM | POA: Diagnosis not present

## 2020-10-29 DIAGNOSIS — M109 Gout, unspecified: Secondary | ICD-10-CM | POA: Diagnosis not present

## 2020-10-29 DIAGNOSIS — E1151 Type 2 diabetes mellitus with diabetic peripheral angiopathy without gangrene: Secondary | ICD-10-CM | POA: Diagnosis not present

## 2020-10-29 DIAGNOSIS — N183 Chronic kidney disease, stage 3 unspecified: Secondary | ICD-10-CM | POA: Diagnosis not present

## 2020-10-29 DIAGNOSIS — I872 Venous insufficiency (chronic) (peripheral): Secondary | ICD-10-CM | POA: Diagnosis not present

## 2020-10-29 DIAGNOSIS — I2510 Atherosclerotic heart disease of native coronary artery without angina pectoris: Secondary | ICD-10-CM | POA: Diagnosis not present

## 2020-10-29 DIAGNOSIS — Z4800 Encounter for change or removal of nonsurgical wound dressing: Secondary | ICD-10-CM | POA: Diagnosis not present

## 2020-10-29 DIAGNOSIS — Z794 Long term (current) use of insulin: Secondary | ICD-10-CM | POA: Diagnosis not present

## 2020-10-29 DIAGNOSIS — L97812 Non-pressure chronic ulcer of other part of right lower leg with fat layer exposed: Secondary | ICD-10-CM | POA: Diagnosis not present

## 2020-10-29 DIAGNOSIS — E785 Hyperlipidemia, unspecified: Secondary | ICD-10-CM | POA: Diagnosis not present

## 2020-10-29 DIAGNOSIS — I130 Hypertensive heart and chronic kidney disease with heart failure and stage 1 through stage 4 chronic kidney disease, or unspecified chronic kidney disease: Secondary | ICD-10-CM | POA: Diagnosis not present

## 2020-10-29 DIAGNOSIS — E1122 Type 2 diabetes mellitus with diabetic chronic kidney disease: Secondary | ICD-10-CM | POA: Diagnosis not present

## 2020-10-29 DIAGNOSIS — I471 Supraventricular tachycardia: Secondary | ICD-10-CM | POA: Diagnosis not present

## 2020-10-29 DIAGNOSIS — L97822 Non-pressure chronic ulcer of other part of left lower leg with fat layer exposed: Secondary | ICD-10-CM | POA: Diagnosis not present

## 2020-10-29 NOTE — Telephone Encounter (Signed)
Lawrence [name] called back and spoke with scheduler stating that she would draw BMET.

## 2020-10-30 DIAGNOSIS — G4733 Obstructive sleep apnea (adult) (pediatric): Secondary | ICD-10-CM | POA: Diagnosis not present

## 2020-10-30 DIAGNOSIS — Z4800 Encounter for change or removal of nonsurgical wound dressing: Secondary | ICD-10-CM | POA: Diagnosis not present

## 2020-10-30 DIAGNOSIS — M109 Gout, unspecified: Secondary | ICD-10-CM | POA: Diagnosis not present

## 2020-10-30 DIAGNOSIS — Z79899 Other long term (current) drug therapy: Secondary | ICD-10-CM | POA: Diagnosis not present

## 2020-10-30 DIAGNOSIS — I5033 Acute on chronic diastolic (congestive) heart failure: Secondary | ICD-10-CM | POA: Diagnosis not present

## 2020-10-30 DIAGNOSIS — E785 Hyperlipidemia, unspecified: Secondary | ICD-10-CM | POA: Diagnosis not present

## 2020-10-30 DIAGNOSIS — N183 Chronic kidney disease, stage 3 unspecified: Secondary | ICD-10-CM | POA: Diagnosis not present

## 2020-10-30 DIAGNOSIS — L97822 Non-pressure chronic ulcer of other part of left lower leg with fat layer exposed: Secondary | ICD-10-CM | POA: Diagnosis not present

## 2020-10-30 DIAGNOSIS — J449 Chronic obstructive pulmonary disease, unspecified: Secondary | ICD-10-CM | POA: Diagnosis not present

## 2020-10-30 DIAGNOSIS — Z794 Long term (current) use of insulin: Secondary | ICD-10-CM | POA: Diagnosis not present

## 2020-10-30 DIAGNOSIS — G4700 Insomnia, unspecified: Secondary | ICD-10-CM | POA: Diagnosis not present

## 2020-10-30 DIAGNOSIS — I471 Supraventricular tachycardia: Secondary | ICD-10-CM | POA: Diagnosis not present

## 2020-10-30 DIAGNOSIS — L97812 Non-pressure chronic ulcer of other part of right lower leg with fat layer exposed: Secondary | ICD-10-CM | POA: Diagnosis not present

## 2020-10-30 DIAGNOSIS — E1151 Type 2 diabetes mellitus with diabetic peripheral angiopathy without gangrene: Secondary | ICD-10-CM | POA: Diagnosis not present

## 2020-10-30 DIAGNOSIS — E1122 Type 2 diabetes mellitus with diabetic chronic kidney disease: Secondary | ICD-10-CM | POA: Diagnosis not present

## 2020-10-30 DIAGNOSIS — I872 Venous insufficiency (chronic) (peripheral): Secondary | ICD-10-CM | POA: Diagnosis not present

## 2020-10-30 DIAGNOSIS — I2510 Atherosclerotic heart disease of native coronary artery without angina pectoris: Secondary | ICD-10-CM | POA: Diagnosis not present

## 2020-10-30 DIAGNOSIS — E1140 Type 2 diabetes mellitus with diabetic neuropathy, unspecified: Secondary | ICD-10-CM | POA: Diagnosis not present

## 2020-10-30 DIAGNOSIS — I130 Hypertensive heart and chronic kidney disease with heart failure and stage 1 through stage 4 chronic kidney disease, or unspecified chronic kidney disease: Secondary | ICD-10-CM | POA: Diagnosis not present

## 2020-10-31 DIAGNOSIS — I872 Venous insufficiency (chronic) (peripheral): Secondary | ICD-10-CM | POA: Diagnosis not present

## 2020-10-31 DIAGNOSIS — G4733 Obstructive sleep apnea (adult) (pediatric): Secondary | ICD-10-CM | POA: Diagnosis not present

## 2020-10-31 DIAGNOSIS — L97812 Non-pressure chronic ulcer of other part of right lower leg with fat layer exposed: Secondary | ICD-10-CM | POA: Diagnosis not present

## 2020-10-31 DIAGNOSIS — N183 Chronic kidney disease, stage 3 unspecified: Secondary | ICD-10-CM | POA: Diagnosis not present

## 2020-10-31 DIAGNOSIS — E1140 Type 2 diabetes mellitus with diabetic neuropathy, unspecified: Secondary | ICD-10-CM | POA: Diagnosis not present

## 2020-10-31 DIAGNOSIS — M109 Gout, unspecified: Secondary | ICD-10-CM | POA: Diagnosis not present

## 2020-10-31 DIAGNOSIS — Z79899 Other long term (current) drug therapy: Secondary | ICD-10-CM | POA: Diagnosis not present

## 2020-10-31 DIAGNOSIS — E1122 Type 2 diabetes mellitus with diabetic chronic kidney disease: Secondary | ICD-10-CM | POA: Diagnosis not present

## 2020-10-31 DIAGNOSIS — L97822 Non-pressure chronic ulcer of other part of left lower leg with fat layer exposed: Secondary | ICD-10-CM | POA: Diagnosis not present

## 2020-10-31 DIAGNOSIS — I2510 Atherosclerotic heart disease of native coronary artery without angina pectoris: Secondary | ICD-10-CM | POA: Diagnosis not present

## 2020-10-31 DIAGNOSIS — Z4800 Encounter for change or removal of nonsurgical wound dressing: Secondary | ICD-10-CM | POA: Diagnosis not present

## 2020-10-31 DIAGNOSIS — Z794 Long term (current) use of insulin: Secondary | ICD-10-CM | POA: Diagnosis not present

## 2020-10-31 DIAGNOSIS — E1151 Type 2 diabetes mellitus with diabetic peripheral angiopathy without gangrene: Secondary | ICD-10-CM | POA: Diagnosis not present

## 2020-10-31 DIAGNOSIS — I471 Supraventricular tachycardia: Secondary | ICD-10-CM | POA: Diagnosis not present

## 2020-10-31 DIAGNOSIS — E785 Hyperlipidemia, unspecified: Secondary | ICD-10-CM | POA: Diagnosis not present

## 2020-10-31 DIAGNOSIS — I130 Hypertensive heart and chronic kidney disease with heart failure and stage 1 through stage 4 chronic kidney disease, or unspecified chronic kidney disease: Secondary | ICD-10-CM | POA: Diagnosis not present

## 2020-10-31 DIAGNOSIS — G4700 Insomnia, unspecified: Secondary | ICD-10-CM | POA: Diagnosis not present

## 2020-10-31 DIAGNOSIS — J449 Chronic obstructive pulmonary disease, unspecified: Secondary | ICD-10-CM | POA: Diagnosis not present

## 2020-10-31 DIAGNOSIS — I5033 Acute on chronic diastolic (congestive) heart failure: Secondary | ICD-10-CM | POA: Diagnosis not present

## 2020-11-01 DIAGNOSIS — I471 Supraventricular tachycardia, unspecified: Secondary | ICD-10-CM

## 2020-11-01 DIAGNOSIS — R000 Tachycardia, unspecified: Secondary | ICD-10-CM

## 2020-11-02 DIAGNOSIS — E1169 Type 2 diabetes mellitus with other specified complication: Secondary | ICD-10-CM | POA: Diagnosis not present

## 2020-11-02 DIAGNOSIS — I5032 Chronic diastolic (congestive) heart failure: Secondary | ICD-10-CM | POA: Diagnosis not present

## 2020-11-02 DIAGNOSIS — G629 Polyneuropathy, unspecified: Secondary | ICD-10-CM | POA: Diagnosis not present

## 2020-11-02 DIAGNOSIS — I10 Essential (primary) hypertension: Secondary | ICD-10-CM | POA: Diagnosis not present

## 2020-11-03 ENCOUNTER — Encounter: Payer: Self-pay | Admitting: Cardiology

## 2020-11-03 DIAGNOSIS — E1122 Type 2 diabetes mellitus with diabetic chronic kidney disease: Secondary | ICD-10-CM | POA: Diagnosis not present

## 2020-11-03 DIAGNOSIS — Z794 Long term (current) use of insulin: Secondary | ICD-10-CM | POA: Diagnosis not present

## 2020-11-03 DIAGNOSIS — I2510 Atherosclerotic heart disease of native coronary artery without angina pectoris: Secondary | ICD-10-CM | POA: Diagnosis not present

## 2020-11-03 DIAGNOSIS — N183 Chronic kidney disease, stage 3 unspecified: Secondary | ICD-10-CM | POA: Diagnosis not present

## 2020-11-03 DIAGNOSIS — E1151 Type 2 diabetes mellitus with diabetic peripheral angiopathy without gangrene: Secondary | ICD-10-CM | POA: Diagnosis not present

## 2020-11-03 DIAGNOSIS — G4733 Obstructive sleep apnea (adult) (pediatric): Secondary | ICD-10-CM | POA: Diagnosis not present

## 2020-11-03 DIAGNOSIS — E1140 Type 2 diabetes mellitus with diabetic neuropathy, unspecified: Secondary | ICD-10-CM | POA: Diagnosis not present

## 2020-11-03 DIAGNOSIS — E785 Hyperlipidemia, unspecified: Secondary | ICD-10-CM | POA: Diagnosis not present

## 2020-11-03 DIAGNOSIS — G4700 Insomnia, unspecified: Secondary | ICD-10-CM | POA: Diagnosis not present

## 2020-11-03 DIAGNOSIS — I5033 Acute on chronic diastolic (congestive) heart failure: Secondary | ICD-10-CM | POA: Diagnosis not present

## 2020-11-03 DIAGNOSIS — I872 Venous insufficiency (chronic) (peripheral): Secondary | ICD-10-CM | POA: Diagnosis not present

## 2020-11-03 DIAGNOSIS — I471 Supraventricular tachycardia: Secondary | ICD-10-CM | POA: Diagnosis not present

## 2020-11-03 DIAGNOSIS — Z79899 Other long term (current) drug therapy: Secondary | ICD-10-CM | POA: Diagnosis not present

## 2020-11-03 DIAGNOSIS — L97812 Non-pressure chronic ulcer of other part of right lower leg with fat layer exposed: Secondary | ICD-10-CM | POA: Diagnosis not present

## 2020-11-03 DIAGNOSIS — I130 Hypertensive heart and chronic kidney disease with heart failure and stage 1 through stage 4 chronic kidney disease, or unspecified chronic kidney disease: Secondary | ICD-10-CM | POA: Diagnosis not present

## 2020-11-03 DIAGNOSIS — Z4800 Encounter for change or removal of nonsurgical wound dressing: Secondary | ICD-10-CM | POA: Diagnosis not present

## 2020-11-03 DIAGNOSIS — J449 Chronic obstructive pulmonary disease, unspecified: Secondary | ICD-10-CM | POA: Diagnosis not present

## 2020-11-03 DIAGNOSIS — L97822 Non-pressure chronic ulcer of other part of left lower leg with fat layer exposed: Secondary | ICD-10-CM | POA: Diagnosis not present

## 2020-11-03 DIAGNOSIS — M109 Gout, unspecified: Secondary | ICD-10-CM | POA: Diagnosis not present

## 2020-11-04 ENCOUNTER — Ambulatory Visit: Payer: Medicare Other | Admitting: Neurology

## 2020-11-06 ENCOUNTER — Encounter: Payer: Self-pay | Admitting: Cardiology

## 2020-11-06 ENCOUNTER — Ambulatory Visit (INDEPENDENT_AMBULATORY_CARE_PROVIDER_SITE_OTHER): Payer: Medicare Other

## 2020-11-06 ENCOUNTER — Other Ambulatory Visit: Payer: Self-pay | Admitting: Student

## 2020-11-06 DIAGNOSIS — I5033 Acute on chronic diastolic (congestive) heart failure: Secondary | ICD-10-CM | POA: Diagnosis not present

## 2020-11-06 DIAGNOSIS — I2510 Atherosclerotic heart disease of native coronary artery without angina pectoris: Secondary | ICD-10-CM | POA: Diagnosis not present

## 2020-11-06 DIAGNOSIS — E785 Hyperlipidemia, unspecified: Secondary | ICD-10-CM | POA: Diagnosis not present

## 2020-11-06 DIAGNOSIS — G4733 Obstructive sleep apnea (adult) (pediatric): Secondary | ICD-10-CM | POA: Diagnosis not present

## 2020-11-06 DIAGNOSIS — I471 Supraventricular tachycardia, unspecified: Secondary | ICD-10-CM

## 2020-11-06 DIAGNOSIS — E1140 Type 2 diabetes mellitus with diabetic neuropathy, unspecified: Secondary | ICD-10-CM | POA: Diagnosis not present

## 2020-11-06 DIAGNOSIS — N183 Chronic kidney disease, stage 3 unspecified: Secondary | ICD-10-CM | POA: Diagnosis not present

## 2020-11-06 DIAGNOSIS — G4700 Insomnia, unspecified: Secondary | ICD-10-CM | POA: Diagnosis not present

## 2020-11-06 DIAGNOSIS — R000 Tachycardia, unspecified: Secondary | ICD-10-CM | POA: Diagnosis not present

## 2020-11-06 DIAGNOSIS — Z4800 Encounter for change or removal of nonsurgical wound dressing: Secondary | ICD-10-CM | POA: Diagnosis not present

## 2020-11-06 DIAGNOSIS — M109 Gout, unspecified: Secondary | ICD-10-CM | POA: Diagnosis not present

## 2020-11-06 DIAGNOSIS — Z794 Long term (current) use of insulin: Secondary | ICD-10-CM | POA: Diagnosis not present

## 2020-11-06 DIAGNOSIS — J449 Chronic obstructive pulmonary disease, unspecified: Secondary | ICD-10-CM | POA: Diagnosis not present

## 2020-11-06 DIAGNOSIS — Z79899 Other long term (current) drug therapy: Secondary | ICD-10-CM | POA: Diagnosis not present

## 2020-11-06 DIAGNOSIS — I130 Hypertensive heart and chronic kidney disease with heart failure and stage 1 through stage 4 chronic kidney disease, or unspecified chronic kidney disease: Secondary | ICD-10-CM | POA: Diagnosis not present

## 2020-11-06 DIAGNOSIS — L97822 Non-pressure chronic ulcer of other part of left lower leg with fat layer exposed: Secondary | ICD-10-CM | POA: Diagnosis not present

## 2020-11-06 DIAGNOSIS — I872 Venous insufficiency (chronic) (peripheral): Secondary | ICD-10-CM | POA: Diagnosis not present

## 2020-11-06 DIAGNOSIS — L97812 Non-pressure chronic ulcer of other part of right lower leg with fat layer exposed: Secondary | ICD-10-CM | POA: Diagnosis not present

## 2020-11-06 DIAGNOSIS — E1122 Type 2 diabetes mellitus with diabetic chronic kidney disease: Secondary | ICD-10-CM | POA: Diagnosis not present

## 2020-11-06 DIAGNOSIS — E1151 Type 2 diabetes mellitus with diabetic peripheral angiopathy without gangrene: Secondary | ICD-10-CM | POA: Diagnosis not present

## 2020-11-07 ENCOUNTER — Telehealth: Payer: Self-pay | Admitting: Internal Medicine

## 2020-11-07 NOTE — Telephone Encounter (Signed)
Cardiology Moonlighter Note  Returned page from [employer]. Patient had first documented episode of atrial fibrillation. HR 110's to 130's. Resolved spontaneously. Now in NSR. Attempted to reach out to patient, who did not answer phone. Will send message to ordering provider.   Marcie Mowers, MD Cardiology Fellow, PGY-8

## 2020-11-09 ENCOUNTER — Other Ambulatory Visit: Payer: Self-pay

## 2020-11-09 ENCOUNTER — Ambulatory Visit (INDEPENDENT_AMBULATORY_CARE_PROVIDER_SITE_OTHER): Payer: Medicare Other | Admitting: Cardiology

## 2020-11-09 ENCOUNTER — Encounter: Payer: Self-pay | Admitting: Cardiology

## 2020-11-09 VITALS — BP 118/64 | HR 94 | Ht 74.0 in | Wt >= 6400 oz

## 2020-11-09 DIAGNOSIS — R072 Precordial pain: Secondary | ICD-10-CM | POA: Diagnosis not present

## 2020-11-09 DIAGNOSIS — I5032 Chronic diastolic (congestive) heart failure: Secondary | ICD-10-CM | POA: Diagnosis not present

## 2020-11-09 DIAGNOSIS — I471 Supraventricular tachycardia, unspecified: Secondary | ICD-10-CM

## 2020-11-09 DIAGNOSIS — I2583 Coronary atherosclerosis due to lipid rich plaque: Secondary | ICD-10-CM

## 2020-11-09 DIAGNOSIS — I10 Essential (primary) hypertension: Secondary | ICD-10-CM

## 2020-11-09 DIAGNOSIS — I2510 Atherosclerotic heart disease of native coronary artery without angina pectoris: Secondary | ICD-10-CM | POA: Diagnosis not present

## 2020-11-09 DIAGNOSIS — E1169 Type 2 diabetes mellitus with other specified complication: Secondary | ICD-10-CM | POA: Diagnosis not present

## 2020-11-09 DIAGNOSIS — E7800 Pure hypercholesterolemia, unspecified: Secondary | ICD-10-CM | POA: Diagnosis not present

## 2020-11-09 DIAGNOSIS — E669 Obesity, unspecified: Secondary | ICD-10-CM

## 2020-11-09 MED ORDER — TORSEMIDE 20 MG PO TABS: ORAL_TABLET | ORAL | 3 refills | Status: DC

## 2020-11-09 NOTE — Progress Notes (Addendum)
Cardiology Office Note:    Date:  11/09/2020   ID:  Lawrence [name], DOB May 22, 1956, MRN [id number]  PCP:  [name], PA-C  Cardiologist:  Fransico Him, MD    Referring MD: No ref. provider found   Chief Complaint  Patient presents with  . Coronary Artery Disease  . Hypertension  . Sleep Apnea  . Congestive Heart Failure    History of Present Illness:    Lawrence [name] is a 65 y.o. male with a hx of nonobstructive coronary artery disease, hypertension, hyperlipidemia, diastolic heart failure, OSA on BiPAP, super morbid obesity, depression, anxiety.  He is a DNR.  He was seen in the hospital 01/25/2018 for evaluation of chest pain and shortness of breath.  Left heart catheterization was subsequently performed which showed minimal coronary artery disease.    He presented the emergency room 02/19/2019 with palpitations.  No arrhythmias were noted.  He wore a ZIO patch which showed multiple episodes of SVT. He was seen by EP and Toprol was increased to $RemoveBefo'75mg'ERPVSJQOpQA$  BID.   Mr. Kroon is morbidly obese.  His activity level is significantly impaired.  He uses a walker at home and a motorized chair when out.  He has chronic lower extremity edema.   He was recently hospitalized in late March with acute on chronic diastolic CHF and diuresed 18L and was placed on 1200cc fluid restriction for hyponatremia.  On discharge Na was 130 and SCr was 0.99.  He also has been wearing a heart monitor to assess for possible afib.  Tele in hospital showed SVT and could not discern whether it was afib or PAT.  This was controlled on Toprol XL $RemoveB'25mg'FklRlsEr$  daily.    He is here today for followup and is doing well.  He has chronic DOE that improved in the hospital and is very stable since discharge.  He has been wearing a heart monitor and the fellow on call was contacted saying that he had an episode of afib.  The patient was unable to be reached.  He is having some dull achiness that occurs both with exertion and rest with  radiation into his left arm.  There is no associated nausea or diaphoresis.  He denies any PND, orthopnea,  dizziness, palpitations or syncope. He has chronic LE edema which is controlled with diuretics and compression hose and he thinks is stable. He is compliant with his meds and is tolerating meds with no SE.    Past Medical History:  Diagnosis Date  . Acute on chronic diastolic CHF (congestive heart failure) ([place]) 01/13/2018  . Acute respiratory failure with hypoxia ([place]) 01/13/2018  . Adenomatous colon polyp 2007  . Anasarca 01/31/2017  . Anemia   . Anxiety   . Arthritis   . Asthma   . Bilateral lower extremity edema 01/31/2017  . Bilateral lower leg cellulitis   . BPH without obstruction/lower urinary tract symptoms 02/22/2017  . Cellulitis 11/07/2017  . Cellulitis of lower extremity   . Chest pain   . Chronic cough   . Chronic diastolic CHF (congestive heart failure) ([place])   . Chronic venous stasis 03/07/2019  . COPD (chronic obstructive pulmonary disease) ([place])   . Coronary artery calcification seen on CAT scan   . Depression   . Diabetic neuropathy ([place]) 09/11/2019  . Hematuria 02/22/2017  . History of anemia 12/26/2018  . History of cardiac cath    a.  01/2018 with angiographically minimal coronary disease with  up to 40% prox LAD and 20%  mid LCx and OM2 with normal LVF and normal LVEDP..  . History of colon polyps 08/24/2018  . Hypertension   . Leukocytosis 08/18/2018  . Morbid obesity ([place])   . New onset type 2 diabetes mellitus ([place]) 11/08/2017  . OSA (obstructive sleep apnea)   . Pain due to onychomycosis of toenails of both feet 09/11/2019  . Peripheral neuropathy 02/22/2017  . Physical deconditioning 02/22/2017  . Primary osteoarthritis, left shoulder 03/05/2017  . PTSD (post-traumatic stress disorder)   . Pure hypercholesterolemia   . QT prolongation 03/07/2019  . Right foot pain 01/13/2018  . Shortness of breath   . Sinus tachycardia 03/07/2019  . Sleep apnea    CPAP  . Type 2  diabetes mellitus with vascular disease ([place]) 09/11/2019    Past Surgical History:  Procedure Laterality Date  . ENDOVENOUS ABLATION SAPHENOUS VEIN W/ LASER Right 08/20/2020   endovenous laser ablation right greater saphenous vein by Gae Gallop MD   . [place]     left knee replacement x 2  . KNEE ARTHROSCOPY Bilateral   . LEFT HEART CATH AND CORONARY ANGIOGRAPHY N/A 01/17/2018   Procedure: LEFT HEART CATH AND CORONARY ANGIOGRAPHY;  Surgeon: Leonie Man, MD;  Location: [facility];  Service: Cardiovascular;  Laterality: N/A;  . UMBILICAL HERNIA REPAIR      Current Medications: Current Meds  Medication Sig  . acetaminophen (TYLENOL) 650 MG CR tablet Take 1,300 mg by mouth every 8 (eight) hours as needed for pain.  [name] albuterol (VENTOLIN HFA) 108 (90 Base) MCG/ACT inhaler Inhale into the lungs.  [name] allopurinol (ZYLOPRIM) 100 MG tablet Take 100 mg by mouth 2 (two) times daily.  . ARIPiprazole (ABILIFY) 5 MG tablet Take 5 mg by mouth at bedtime.   [name] aspirin EC 81 MG EC tablet Take 1 tablet (81 mg total) by mouth daily. Swallow whole.  [name] atorvastatin (LIPITOR) 40 MG tablet TAKE ONE TABLET BY MOUTH DAILY AT 6PM  . blood glucose meter kit and supplies Dispense based on patient and insurance preference. Use up to four times daily as directed. (FOR ICD-10 E10.9, E11.9).  . cetirizine (ZYRTEC) 10 MG tablet Take 10 mg by mouth at bedtime.   . Dulaglutide (TRULICITY) 3 UD/1.4HF SOPN Inject 3 mg into the skin once a week.  . ferrous sulfate 324 (65 Fe) MG TBEC Take 324 mg by mouth daily with breakfast.   . fluticasone (FLONASE) 50 MCG/ACT nasal spray Place 2 sprays into both nostrils daily.   [name] LANTUS SOLOSTAR 100 UNIT/ML Solostar Pen Inject 3 Units into the skin daily.  [name] levETIRAcetam (KEPPRA) 1000 MG tablet Take 1 tablet (1,000 mg total) by mouth 2 (two) times daily.  . metFORMIN (GLUCOPHAGE) 1000 MG tablet Take 1,000 mg by mouth 2 (two) times daily with a meal.  . metoprolol  succinate (TOPROL-XL) 25 MG 24 hr tablet Take 1 tablet (25 mg total) by mouth daily.  [name] omeprazole (PRILOSEC) 20 MG capsule Take 20 mg by mouth every morning.  [name] oxyCODONE (OXY IR/ROXICODONE) 5 MG immediate release tablet Take 5 mg by mouth every 8 (eight) hours as needed.  . potassium chloride SA (KLOR-CON) 20 MEQ tablet Take 2 tablets (40 mEq total) by mouth daily.  . prazosin (MINIPRESS) 2 MG capsule Take 4 mg by mouth at bedtime.  . pregabalin (LYRICA) 100 MG capsule Take 100 mg by mouth in the morning, at noon, and at bedtime.   . silver sulfADIAZINE (SILVADENE) 1 % cream Apply  1 application topically See admin instructions. Apply as directed once a day as directed to bilateral legs  . spironolactone (ALDACTONE) 25 MG tablet Take 1 tablet (25 mg total) by mouth daily.  . temazepam (RESTORIL) 15 MG capsule Take 15 mg by mouth at bedtime.   . torsemide (DEMADEX) 20 MG tablet Take 2 tablets (40 mg total) in the morning and 1 tablet (20 mg total) in the evening.  . traZODone (DESYREL) 100 MG tablet Take 200 mg by mouth at bedtime.  [name] venlafaxine XR (EFFEXOR-XR) 75 MG 24 hr capsule Take 225 mg by mouth daily with breakfast.  . Vitamin D, Ergocalciferol, (DRISDOL) 1.25 MG (50000 UNIT) CAPS capsule Take 50,000 Units by mouth every 7 (seven) days.  . [DISCONTINUED] torsemide (DEMADEX) 20 MG tablet Take 1 tablet (20 mg total) by mouth 2 (two) times daily.     Allergies:   Vancomycin, Niacin, Niacin and related, Tubersol [tuberculin], and Doxycycline   Social History   Socioeconomic History  . Marital status: Single    Spouse name: Not on file  . Number of children: 1  . Years of education: Not on file  . Highest education level: Not on file  Occupational History  . Occupation: retired  Tobacco Use  . Smoking status: Former Smoker    Quit date: 03/07/2016    Years since quitting: 4.6  . Smokeless tobacco: Never Used  Substance and Sexual Activity  . Alcohol use: Not Currently    Comment:  rare  . Drug use: No  . Sexual activity: Not on file  Other Topics Concern  . Not on file  Social History Narrative  . Not on file   Social Determinants of Health   Financial Resource Strain: Not on file  Food Insecurity: Not on file  Transportation Needs: Not on file  Physical Activity: Not on file  Stress: Not on file  Social Connections: Not on file     Family History: The patient's family history includes CAD in his maternal grandfather; Diabetes in an other family member. There is no history of Diabetes Mellitus II, Colon cancer, Esophageal cancer, Inflammatory bowel disease, Liver disease, Pancreatic cancer, Rectal cancer, or Stomach cancer.  ROS:   Please see the history of present illness.    ROS  All other systems reviewed and negative.   EKGs/Labs/Other Studies Reviewed:    The following studies were reviewed today: PAP compliance download from [facility], outside labs   EKG:  EKG is ordered today and showed NSR at 97bpm with PAC and low voltage QRS with nonspecific T wave abnormality  Recent Labs: 04/01/2020: TSH 1.690 10/16/2020: ALT 26 10/21/2020: Magnesium 2.1 10/22/2020: B Natriuretic Peptide 36.3 10/25/2020: Hemoglobin 13.1; Platelets 351 10/26/2020: BUN 16; Creatinine, Ser 0.99; Potassium 4.2; Sodium 130   Recent Lipid Panel    Component Value Date/Time   CHOL 116 10/17/2020 0347   TRIG 127 10/17/2020 0347   HDL 33 (L) 10/17/2020 0347   CHOLHDL 3.5 10/17/2020 0347   VLDL 25 10/17/2020 0347   LDLCALC 58 10/17/2020 0347    Physical Exam:    VS:  BP 118/64   Pulse 94   Ht $R'6\' 2"'io$  (1.88 m)   Wt (!) 400 lb (181.4 kg)   SpO2 96%   BMI 51.36 kg/m     Wt Readings from Last 3 Encounters:  11/09/20 (!) 400 lb (181.4 kg)  10/26/20 (!) 397 lb 12.8 oz (180.4 kg)  08/20/20 (!) 423 lb (191.9 kg)  OZD:GUYQIHKV obese in a wheelchair HEENT: Normal NECK: No JVD; No carotid bruits LYMPHATICS: No lymphadenopathy CARDIAC:RRR, no murmurs, rubs,  gallops RESPIRATORY:  Clear to auscultation without rales, wheezing or rhonchi  ABDOMEN: Soft, non-tender, non-distended MUSCULOSKELETAL:  Chronic skin changes of chronic LE edema with trace edema; No deformity  SKIN: Warm and dry NEUROLOGIC:  Alert and oriented x 3 PSYCHIATRIC:  Normal affect    ASSESSMENT:    1. SVT (supraventricular tachycardia) ([place])   2. Coronary artery disease due to lipid rich plaque   3. Essential hypertension   4. Morbid obesity ([place])   5. Pure hypercholesterolemia   6. Diabetes mellitus type 2 in obese ([place])   7. Chronic diastolic CHF (congestive heart failure) ([place])   8. Precordial pain    PLAN:    In order of problems listed above:  1.  SVT/PAF -followed by EP in the past -palpitations still present occasionally but significantly improved on Toprol XL  -recent event monitor showed an episode of atrial fibrillation that was short lived -discussed with EP and will have patient continue wearing heart monitor to determine afib burden -studies have recently shown no difference in outcomes in patients with afib burden < 24 hour and risk of CVA off or on DOAC treament  2.  ASCAD -minimal coronary artery disease by cath 2019 -he keeps having CP that is both exertional and nonexertional with no other associated sx -I will get a 2 day lexiscan myoview to assess further -Shared Decision Making/Informed Consent The risks [chest pain, shortness of breath, cardiac arrhythmias, dizziness, blood pressure fluctuations, myocardial infarction, stroke/transient ischemic attack, nausea, vomiting, allergic reaction, radiation exposure, metallic taste sensation and life-threatening complications (estimated to be 1 in 10,000)], benefits (risk stratification, diagnosing coronary artery disease, treatment guidance) and alternatives of a nuclear stress test were discussed in detail with Mr. Gater and he agrees to proceed. -still has chronic DOE that is stable -continue ASA,  BB and statin  3.  HTN -BP controlled on exam today -continue prazosin 4mg  qhs, Lisinopril 20mg  daily, spiro 25mg  daily and Toprol XL 25mg  daily -outside labs reviewed on [facility] showing a bump in SCR to 1.53 on 06/11/2020 from last BMET   4.  Morbid Obesity  -I have encouraged him to cut back on carbs and portions.   5.  HLD -LDL goal < 70 -LDL was 58 in March 2022 -continue atorvastatin 40mg  daily  6.  DM type 2 with CKD stage 3a -followed by PCP -HbA1C was up to 9.2% last month -continue Trulicity, Lantus Insulin  and Metformin 1000mg  BID -continue statin and ACE I -seeing nephrology in the near future  7.  Chronic diastolic CHF -volume status if very difficult to assess in setting of morbid obesity -recently hospitalized with cellulitis complicated but acute exacerbation of CHF -he has chronic LE edema from morbid obesity and chronic venous insuff which are stable  -he lost 18L in hospital and weight was 180kg (197lbs)  at discharge and was changed to [facility] for better absorption -weight today up 1kg (3lbs) from discharge weight -increase demadex to 40mg  qam and 20mg  qpm and spiro 25mg  daily -check BMET in 1 week  Followup with me in 3 months  Medication Adjustments/Labs and Tests Ordered: Current medicines are reviewed at length with the patient today.  Concerns regarding medicines are outlined above.  Orders Placed This Encounter  Procedures  . Basic metabolic panel  . Cardiac Stress Test: Informed Consent Details: Physician/Practitioner Attestation; Transcribe to consent form  and obtain patient signature  . MYOCARDIAL PERFUSION IMAGING   Meds ordered this encounter  Medications  . torsemide (DEMADEX) 20 MG tablet    Sig: Take 2 tablets (40 mg total) in the morning and 1 tablet (20 mg total) in the evening.    Dispense:  270 tablet    Refill:  3    Signed, Fransico Him, MD  11/09/2020 3:40 PM    [facility]

## 2020-11-09 NOTE — Telephone Encounter (Signed)
Patient was seen in office today by Dr. Radford Pax. See note below.   1.  SVT/PAF -followed by EP in the past -palpitations still present occasionally but significantly improved on Toprol XL  -recent event monitor showed an episode of atrial fibrillation that was short lived -discussed with EP and will have patient continue wearing heart monitor to determine afib burden -studies have recently shown no difference in outcomes in patients with afib burden < 24 hour and risk of CVA off or on DOAC treament

## 2020-11-09 NOTE — Telephone Encounter (Signed)
Patient noted to have first episode of PAF but fellow on call could not get in touch with patient.  Please call and get him into afib clinic for tomorrwo

## 2020-11-09 NOTE — Patient Instructions (Addendum)
Medication Instructions:  Your physician has recommended you make the following change in your medication:  1) INCREASE Demadex to 40 mg in the morning and 20 mg in the evening.   *If you need a refill on your cardiac medications before your next appointment, please call your pharmacy*  Lab Work: BMET in one week If you have labs (blood work) drawn today and your tests are completely normal, you will receive your results only by: [name] MyChart Message (if you have MyChart) OR . A paper copy in the mail If you have any lab test that is abnormal or we need to change your treatment, we will call you to review the results.   Testing/Procedures: Your physician has requested that you have a lexiscan myoview. For further information please visit [email or web address]. Please follow instruction sheet, as given.  Follow-Up: At [facility], you and your health needs are our priority.  As part of our continuing mission to provide you with exceptional heart care, we have created designated Provider Care Teams.  These Care Teams include your primary Cardiologist (physician) and Advanced Practice Providers (APPs -  Physician Assistants and Nurse Practitioners) who all work together to provide you with the care you need, when you need it.   Your next appointment:   3 month(s)  The format for your next appointment:   In Person  Provider:   You may see Fransico Him, MD or one of the following Advanced Practice Providers on your designated Care Team:    Melina Copa, PA-C  Ermalinda Barrios, PA-C

## 2020-11-10 ENCOUNTER — Other Ambulatory Visit (INDEPENDENT_AMBULATORY_CARE_PROVIDER_SITE_OTHER): Payer: Medicare Other

## 2020-11-10 DIAGNOSIS — Z794 Long term (current) use of insulin: Secondary | ICD-10-CM | POA: Diagnosis not present

## 2020-11-10 DIAGNOSIS — Z79899 Other long term (current) drug therapy: Secondary | ICD-10-CM | POA: Diagnosis not present

## 2020-11-10 DIAGNOSIS — N183 Chronic kidney disease, stage 3 unspecified: Secondary | ICD-10-CM | POA: Diagnosis not present

## 2020-11-10 DIAGNOSIS — I471 Supraventricular tachycardia, unspecified: Secondary | ICD-10-CM

## 2020-11-10 DIAGNOSIS — L97822 Non-pressure chronic ulcer of other part of left lower leg with fat layer exposed: Secondary | ICD-10-CM | POA: Diagnosis not present

## 2020-11-10 DIAGNOSIS — I2510 Atherosclerotic heart disease of native coronary artery without angina pectoris: Secondary | ICD-10-CM | POA: Diagnosis not present

## 2020-11-10 DIAGNOSIS — G4733 Obstructive sleep apnea (adult) (pediatric): Secondary | ICD-10-CM | POA: Diagnosis not present

## 2020-11-10 DIAGNOSIS — E785 Hyperlipidemia, unspecified: Secondary | ICD-10-CM | POA: Diagnosis not present

## 2020-11-10 DIAGNOSIS — E1151 Type 2 diabetes mellitus with diabetic peripheral angiopathy without gangrene: Secondary | ICD-10-CM | POA: Diagnosis not present

## 2020-11-10 DIAGNOSIS — E1122 Type 2 diabetes mellitus with diabetic chronic kidney disease: Secondary | ICD-10-CM | POA: Diagnosis not present

## 2020-11-10 DIAGNOSIS — Z4800 Encounter for change or removal of nonsurgical wound dressing: Secondary | ICD-10-CM | POA: Diagnosis not present

## 2020-11-10 DIAGNOSIS — E1140 Type 2 diabetes mellitus with diabetic neuropathy, unspecified: Secondary | ICD-10-CM | POA: Diagnosis not present

## 2020-11-10 DIAGNOSIS — I5033 Acute on chronic diastolic (congestive) heart failure: Secondary | ICD-10-CM | POA: Diagnosis not present

## 2020-11-10 DIAGNOSIS — M109 Gout, unspecified: Secondary | ICD-10-CM | POA: Diagnosis not present

## 2020-11-10 DIAGNOSIS — I130 Hypertensive heart and chronic kidney disease with heart failure and stage 1 through stage 4 chronic kidney disease, or unspecified chronic kidney disease: Secondary | ICD-10-CM | POA: Diagnosis not present

## 2020-11-10 DIAGNOSIS — G4700 Insomnia, unspecified: Secondary | ICD-10-CM | POA: Diagnosis not present

## 2020-11-10 DIAGNOSIS — I872 Venous insufficiency (chronic) (peripheral): Secondary | ICD-10-CM | POA: Diagnosis not present

## 2020-11-10 DIAGNOSIS — J449 Chronic obstructive pulmonary disease, unspecified: Secondary | ICD-10-CM | POA: Diagnosis not present

## 2020-11-10 DIAGNOSIS — L97812 Non-pressure chronic ulcer of other part of right lower leg with fat layer exposed: Secondary | ICD-10-CM | POA: Diagnosis not present

## 2020-11-13 ENCOUNTER — Telehealth: Payer: Self-pay | Admitting: Internal Medicine

## 2020-11-13 ENCOUNTER — Encounter (HOSPITAL_COMMUNITY): Payer: Self-pay

## 2020-11-13 ENCOUNTER — Emergency Department (HOSPITAL_COMMUNITY)
Admission: EM | Admit: 2020-11-13 | Discharge: 2020-11-14 | Disposition: A | Discharge status: Home / Self Care | Payer: No Typology Code available for payment source | Attending: Emergency Medicine | Admitting: Emergency Medicine

## 2020-11-13 ENCOUNTER — Emergency Department (HOSPITAL_COMMUNITY): Payer: No Typology Code available for payment source

## 2020-11-13 DIAGNOSIS — R0789 Other chest pain: Secondary | ICD-10-CM | POA: Diagnosis not present

## 2020-11-13 DIAGNOSIS — Z96652 Presence of left artificial knee joint: Secondary | ICD-10-CM | POA: Diagnosis not present

## 2020-11-13 DIAGNOSIS — J449 Chronic obstructive pulmonary disease, unspecified: Secondary | ICD-10-CM | POA: Insufficient documentation

## 2020-11-13 DIAGNOSIS — I471 Supraventricular tachycardia: Secondary | ICD-10-CM | POA: Diagnosis not present

## 2020-11-13 DIAGNOSIS — J45909 Unspecified asthma, uncomplicated: Secondary | ICD-10-CM | POA: Insufficient documentation

## 2020-11-13 DIAGNOSIS — M109 Gout, unspecified: Secondary | ICD-10-CM | POA: Diagnosis not present

## 2020-11-13 DIAGNOSIS — Z794 Long term (current) use of insulin: Secondary | ICD-10-CM | POA: Diagnosis not present

## 2020-11-13 DIAGNOSIS — Z20822 Contact with and (suspected) exposure to covid-19: Secondary | ICD-10-CM | POA: Insufficient documentation

## 2020-11-13 DIAGNOSIS — G4700 Insomnia, unspecified: Secondary | ICD-10-CM | POA: Diagnosis not present

## 2020-11-13 DIAGNOSIS — E1122 Type 2 diabetes mellitus with diabetic chronic kidney disease: Secondary | ICD-10-CM | POA: Diagnosis not present

## 2020-11-13 DIAGNOSIS — I130 Hypertensive heart and chronic kidney disease with heart failure and stage 1 through stage 4 chronic kidney disease, or unspecified chronic kidney disease: Secondary | ICD-10-CM | POA: Diagnosis not present

## 2020-11-13 DIAGNOSIS — I5033 Acute on chronic diastolic (congestive) heart failure: Secondary | ICD-10-CM | POA: Diagnosis not present

## 2020-11-13 DIAGNOSIS — Z4800 Encounter for change or removal of nonsurgical wound dressing: Secondary | ICD-10-CM | POA: Diagnosis not present

## 2020-11-13 DIAGNOSIS — Z743 Need for continuous supervision: Secondary | ICD-10-CM | POA: Diagnosis not present

## 2020-11-13 DIAGNOSIS — R404 Transient alteration of awareness: Secondary | ICD-10-CM | POA: Diagnosis not present

## 2020-11-13 DIAGNOSIS — I2510 Atherosclerotic heart disease of native coronary artery without angina pectoris: Secondary | ICD-10-CM | POA: Insufficient documentation

## 2020-11-13 DIAGNOSIS — G4733 Obstructive sleep apnea (adult) (pediatric): Secondary | ICD-10-CM | POA: Diagnosis not present

## 2020-11-13 DIAGNOSIS — R079 Chest pain, unspecified: Secondary | ICD-10-CM | POA: Diagnosis present

## 2020-11-13 DIAGNOSIS — Z7982 Long term (current) use of aspirin: Secondary | ICD-10-CM | POA: Insufficient documentation

## 2020-11-13 DIAGNOSIS — L97812 Non-pressure chronic ulcer of other part of right lower leg with fat layer exposed: Secondary | ICD-10-CM | POA: Diagnosis not present

## 2020-11-13 DIAGNOSIS — I872 Venous insufficiency (chronic) (peripheral): Secondary | ICD-10-CM | POA: Diagnosis not present

## 2020-11-13 DIAGNOSIS — Z7951 Long term (current) use of inhaled steroids: Secondary | ICD-10-CM | POA: Insufficient documentation

## 2020-11-13 DIAGNOSIS — L03115 Cellulitis of right lower limb: Secondary | ICD-10-CM | POA: Diagnosis not present

## 2020-11-13 DIAGNOSIS — R0602 Shortness of breath: Secondary | ICD-10-CM | POA: Diagnosis not present

## 2020-11-13 DIAGNOSIS — Z87891 Personal history of nicotine dependence: Secondary | ICD-10-CM | POA: Insufficient documentation

## 2020-11-13 DIAGNOSIS — Z79899 Other long term (current) drug therapy: Secondary | ICD-10-CM | POA: Diagnosis not present

## 2020-11-13 DIAGNOSIS — E1140 Type 2 diabetes mellitus with diabetic neuropathy, unspecified: Secondary | ICD-10-CM | POA: Insufficient documentation

## 2020-11-13 DIAGNOSIS — L97822 Non-pressure chronic ulcer of other part of left lower leg with fat layer exposed: Secondary | ICD-10-CM | POA: Diagnosis not present

## 2020-11-13 DIAGNOSIS — L03119 Cellulitis of unspecified part of limb: Secondary | ICD-10-CM

## 2020-11-13 DIAGNOSIS — Z7984 Long term (current) use of oral hypoglycemic drugs: Secondary | ICD-10-CM | POA: Insufficient documentation

## 2020-11-13 DIAGNOSIS — E1151 Type 2 diabetes mellitus with diabetic peripheral angiopathy without gangrene: Secondary | ICD-10-CM | POA: Diagnosis not present

## 2020-11-13 DIAGNOSIS — I110 Hypertensive heart disease with heart failure: Secondary | ICD-10-CM | POA: Diagnosis not present

## 2020-11-13 DIAGNOSIS — E785 Hyperlipidemia, unspecified: Secondary | ICD-10-CM | POA: Diagnosis not present

## 2020-11-13 DIAGNOSIS — N183 Chronic kidney disease, stage 3 unspecified: Secondary | ICD-10-CM | POA: Diagnosis not present

## 2020-11-13 DIAGNOSIS — L03116 Cellulitis of left lower limb: Secondary | ICD-10-CM | POA: Insufficient documentation

## 2020-11-13 LAB — COMPREHENSIVE METABOLIC PANEL
ALT: 24 U/L (ref 0–44)
AST: 20 U/L (ref 15–41)
Albumin: 3.3 g/dL — ABNORMAL LOW (ref 3.5–5.0)
Alkaline Phosphatase: 70 U/L (ref 38–126)
Anion gap: 10 (ref 5–15)
BUN: 20 mg/dL (ref 8–23)
CO2: 29 mmol/L (ref 22–32)
Calcium: 8.7 mg/dL — ABNORMAL LOW (ref 8.9–10.3)
Chloride: 99 mmol/L (ref 98–111)
Creatinine, Ser: 1.15 mg/dL (ref 0.61–1.24)
GFR, Estimated: >60 mL/min (ref >60)
Glucose, Bld: 182 mg/dL — ABNORMAL HIGH (ref 70–99)
Potassium: 3.9 mmol/L (ref 3.5–5.1)
Sodium: 138 mmol/L (ref 135–145)
Total Bilirubin: 0.6 mg/dL (ref 0.3–1.2)
Total Protein: 6.5 g/dL (ref 6.5–8.1)

## 2020-11-13 LAB — CBC WITH DIFFERENTIAL/PLATELET
Abs Immature Granulocytes: 0.12 10*3/uL — ABNORMAL HIGH (ref 0.00–0.07)
Basophils Absolute: 0 10*3/uL (ref 0.0–0.1)
Basophils Relative: 0 %
Eosinophils Absolute: 0.6 10*3/uL — ABNORMAL HIGH (ref 0.0–0.5)
Eosinophils Relative: 5 %
HCT: 38.4 % — ABNORMAL LOW (ref 39.0–52.0)
Hemoglobin: 12.3 g/dL — ABNORMAL LOW (ref 13.0–17.0)
Immature Granulocytes: 1 %
Lymphocytes Relative: 13 %
Lymphs Abs: 1.4 10*3/uL (ref 0.7–4.0)
MCH: 29.2 pg (ref 26.0–34.0)
MCHC: 32 g/dL (ref 30.0–36.0)
MCV: 91.2 fL (ref 80.0–100.0)
Monocytes Absolute: 0.6 10*3/uL (ref 0.1–1.0)
Monocytes Relative: 5 %
Neutro Abs: 8.4 10*3/uL — ABNORMAL HIGH (ref 1.7–7.7)
Neutrophils Relative %: 76 %
Platelets: 235 10*3/uL (ref 150–400)
RBC: 4.21 MIL/uL — ABNORMAL LOW (ref 4.22–5.81)
RDW: 15.5 % (ref 11.5–15.5)
WBC: 11.2 10*3/uL — ABNORMAL HIGH (ref 4.0–10.5)
nRBC: 0 % (ref 0.0–0.2)

## 2020-11-13 LAB — SARS CORONAVIRUS 2 (TAT 6-24 HRS): SARS Coronavirus 2: NEGATIVE

## 2020-11-13 LAB — TROPONIN I (HIGH SENSITIVITY)
Troponin I (High Sensitivity): 6 ng/L (ref <18)
Troponin I (High Sensitivity): 6 ng/L (ref <18)

## 2020-11-13 LAB — LIPASE, BLOOD: Lipase: 28 U/L (ref 11–51)

## 2020-11-13 LAB — BRAIN NATRIURETIC PEPTIDE: B Natriuretic Peptide: 40.7 pg/mL (ref 0.0–100.0)

## 2020-11-13 LAB — CBG MONITORING, ED: Glucose-Capillary: 194 mg/dL — ABNORMAL HIGH (ref 70–99)

## 2020-11-13 MED ORDER — CEPHALEXIN 500 MG PO CAPS: 500.0000 mg | ORAL_CAPSULE | Freq: Two times a day (BID) | ORAL | 0 refills | Status: DC

## 2020-11-13 MED ORDER — OXYCODONE HCL 5 MG PO TABS
5.0000 mg | ORAL_TABLET | ORAL | Status: DC | PRN
Start: 2020-11-13 — End: 2020-11-14
  Administered 2020-11-13 – 2020-11-14 (×3): 5 mg via ORAL
  Filled 2020-11-13 (×3): qty 1

## 2020-11-13 NOTE — ED Notes (Signed)
Assuming care. Pt resting in bed @ this time w/ NAD noted. VSS. Cardiac monitoring in place w/ bp cycling and spo2 being monnitored. Updated on plan of care. Deny needs or concerns @ this time. Bed low and locked.

## 2020-11-13 NOTE — ED Provider Notes (Addendum)
[facility] EMERGENCY DEPARTMENT Provider Note   CSN: [id number] Arrival date & time: 11/13/20  1442     History Chief Complaint  Patient presents with  . Chest Pain    Lawrence [name] is a 65 y.o. male.  HPI Patient is a 65 year old male extensive medical history of heart failure, bilateral lower extremity edema with erythema that is chronic, COPD, CAD noticed on the CAT scan without history of ACS who follows with cardiology, diabetes and hypertension presenting with a chief complaint of chest pain approximately 1 hour prior to arrival is left-sided center chest chest pain that occurred at rest.  It is now improving.  He denies any history of similar.  Patient denies fevers or chills, nausea vomiting syncope or shortness of breath.  Patient otherwise ambulatory and well-appearing his baseline.  Patient lives at home with a significant other.  Past Medical History:  Diagnosis Date  . Acute on chronic diastolic CHF (congestive heart failure) ([place]) 01/13/2018  . Acute respiratory failure with hypoxia ([place]) 01/13/2018  . Adenomatous colon polyp 2007  . Anasarca 01/31/2017  . Anemia   . Anxiety   . Arthritis   . Asthma   . Bilateral lower extremity edema 01/31/2017  . Bilateral lower leg cellulitis   . BPH without obstruction/lower urinary tract symptoms 02/22/2017  . Cellulitis 11/07/2017  . Cellulitis of lower extremity   . Chest pain   . Chronic cough   . Chronic diastolic CHF (congestive heart failure) ([place])   . Chronic venous stasis 03/07/2019  . COPD (chronic obstructive pulmonary disease) ([place])   . Coronary artery calcification seen on CAT scan   . Depression   . Diabetic neuropathy ([place]) 09/11/2019  . Hematuria 02/22/2017  . History of anemia 12/26/2018  . History of cardiac cath    a.  01/2018 with angiographically minimal coronary disease with  up to 40% prox LAD and 20% mid LCx and OM2 with normal LVF and normal LVEDP..  . History of colon polyps 08/24/2018  .  Hypertension   . Leukocytosis 08/18/2018  . Morbid obesity ([place])   . New onset type 2 diabetes mellitus ([place]) 11/08/2017  . OSA (obstructive sleep apnea)   . Pain due to onychomycosis of toenails of both feet 09/11/2019  . Peripheral neuropathy 02/22/2017  . Physical deconditioning 02/22/2017  . Primary osteoarthritis, left shoulder 03/05/2017  . PTSD (post-traumatic stress disorder)   . Pure hypercholesterolemia   . QT prolongation 03/07/2019  . Right foot pain 01/13/2018  . Shortness of breath   . Sinus tachycardia 03/07/2019  . Sleep apnea    CPAP  . Type 2 diabetes mellitus with vascular disease ([place]) 09/11/2019    Patient Active Problem List   Diagnosis Date Noted  . Coronary artery disease due to lipid rich plaque   . SVT (supraventricular tachycardia) ([place])   . Seizure ([place])   . Acute CHF (congestive heart failure) ([place]) 10/17/2020  . Essential hypertension 10/17/2020  . Hypokalemia 10/17/2020  . Cellulitis of right leg 12/04/2019  . Pain due to onychomycosis of toenails of both feet 09/11/2019  . Diabetic neuropathy ([place]) 09/11/2019  . Type 2 diabetes mellitus with vascular disease ([place]) 09/11/2019  . Sinus tachycardia 03/07/2019  . Chronic venous stasis 03/07/2019  . QT prolongation 03/07/2019  . History of anemia 12/26/2018  . Anemia 08/24/2018  . History of colon polyps 08/24/2018  . Do not resuscitate status 08/24/2018  . Chronic diastolic CHF (congestive heart failure) ([place]) 08/18/2018  .  Leukocytosis 08/18/2018  . Bilateral lower leg cellulitis   . Coronary artery calcification seen on CAT scan   . Pure hypercholesterolemia   . Chest pain   . Shortness of breath   . Acute respiratory failure with hypoxia ([place]) 01/13/2018  . Right foot pain 01/13/2018  . Acute on chronic diastolic CHF (congestive heart failure) ([place]) 01/13/2018  . Diabetes mellitus type 2 in obese ([place]) 01/13/2018  . New onset type 2 diabetes mellitus ([place]) 11/08/2017  . Cellulitis 11/07/2017  .  Primary osteoarthritis, left shoulder 03/05/2017  . Essential hypertension, benign 02/22/2017  . BPH without obstruction/lower urinary tract symptoms 02/22/2017  . Hematuria 02/22/2017  . Peripheral neuropathy 02/22/2017  . Physical deconditioning 02/22/2017  . Morbid obesity ([place])   . PTSD (post-traumatic stress disorder)   . Anasarca 01/31/2017  . Bilateral lower extremity edema 01/31/2017  . Cellulitis of lower extremity   . OSA (obstructive sleep apnea)     Past Surgical History:  Procedure Laterality Date  . ENDOVENOUS ABLATION SAPHENOUS VEIN W/ LASER Right 08/20/2020   endovenous laser ablation right greater saphenous vein by Gae Gallop MD   . [place]     left knee replacement x 2  . KNEE ARTHROSCOPY Bilateral   . LEFT HEART CATH AND CORONARY ANGIOGRAPHY N/A 01/17/2018   Procedure: LEFT HEART CATH AND CORONARY ANGIOGRAPHY;  Surgeon: Leonie Man, MD;  Location: [facility];  Service: Cardiovascular;  Laterality: N/A;  . UMBILICAL HERNIA REPAIR         Family History  Problem Relation Age of Onset  . CAD Maternal Grandfather   . Diabetes Other   . Diabetes Mellitus II Neg Hx   . Colon cancer Neg Hx   . Esophageal cancer Neg Hx   . Inflammatory bowel disease Neg Hx   . Liver disease Neg Hx   . Pancreatic cancer Neg Hx   . Rectal cancer Neg Hx   . Stomach cancer Neg Hx     Social History   Tobacco Use  . Smoking status: Former Smoker    Quit date: 03/07/2016    Years since quitting: 4.6  . Smokeless tobacco: Never Used  Substance Use Topics  . Alcohol use: Not Currently    Comment: rare  . Drug use: No    Home Medications Prior to Admission medications   Medication Sig Start Date End Date Taking? Authorizing Provider  cephALEXin (KEFLEX) 500 MG capsule Take 1 capsule (500 mg total) by mouth 2 (two) times daily. 11/13/20  Yes Tretha Sciara, MD  acetaminophen (TYLENOL) 650 MG CR tablet Take 1,300 mg by mouth every 8 (eight) hours as  needed for pain.    [provider]  albuterol (VENTOLIN HFA) 108 (90 Base) MCG/ACT inhaler Inhale into the lungs. 03/10/20   [provider]  allopurinol (ZYLOPRIM) 100 MG tablet Take 100 mg by mouth 2 (two) times daily.    [provider]  ARIPiprazole (ABILIFY) 5 MG tablet Take 5 mg by mouth at bedtime.     [provider]  aspirin EC 81 MG EC tablet Take 1 tablet (81 mg total) by mouth daily. Swallow whole. 10/27/20   Nolberto Hanlon, MD  atorvastatin (LIPITOR) 40 MG tablet TAKE ONE TABLET BY MOUTH DAILY AT 6PM 05/29/20   Sueanne Margarita, MD  blood glucose meter kit and supplies Dispense based on patient and insurance preference. Use up to four times daily as directed. (FOR ICD-10 E10.9, E11.9). 11/09/17  Eugenie Filler, MD  cetirizine (ZYRTEC) 10 MG tablet Take 10 mg by mouth at bedtime.     [provider]  Dulaglutide (TRULICITY) 3 AJ/2.8NO SOPN Inject 3 mg into the skin once a week.    [provider]  ferrous sulfate 324 (65 Fe) MG TBEC Take 324 mg by mouth daily with breakfast.     [provider]  fluticasone (FLONASE) 50 MCG/ACT nasal spray Place 2 sprays into both nostrils daily.     [provider]  LANTUS SOLOSTAR 100 UNIT/ML Solostar Pen Inject 3 Units into the skin daily. 09/10/20   [provider]  levETIRAcetam (KEPPRA) 1000 MG tablet Take 1 tablet (1,000 mg total) by mouth 2 (two) times daily. 10/26/20 11/25/20  Nolberto Hanlon, MD  metFORMIN (GLUCOPHAGE) 1000 MG tablet Take 1,000 mg by mouth 2 (two) times daily with a meal.    [provider]  metoprolol succinate (TOPROL-XL) 25 MG 24 hr tablet Take 1 tablet (25 mg total) by mouth daily. 10/27/20 11/26/20  Nolberto Hanlon, MD  omeprazole (PRILOSEC) 20 MG capsule Take 20 mg by mouth every morning. 05/29/20   [provider]  oxyCODONE (OXY IR/ROXICODONE) 5 MG immediate release tablet Take 5 mg by mouth every 8 (eight) hours as needed. 09/17/20    [provider]  potassium chloride SA (KLOR-CON) 20 MEQ tablet Take 2 tablets (40 mEq total) by mouth daily. 10/27/20 11/26/20  Nolberto Hanlon, MD  prazosin (MINIPRESS) 2 MG capsule Take 4 mg by mouth at bedtime.    [provider]  pregabalin (LYRICA) 100 MG capsule Take 100 mg by mouth in the morning, at noon, and at bedtime.  07/14/19   [provider]  silver sulfADIAZINE (SILVADENE) 1 % cream Apply 1 application topically See admin instructions. Apply as directed once a day as directed to bilateral legs    [provider]  spironolactone (ALDACTONE) 25 MG tablet Take 1 tablet (25 mg total) by mouth daily. 05/20/20   Shirley Friar, PA-C  temazepam (RESTORIL) 15 MG capsule Take 15 mg by mouth at bedtime.     [provider]  torsemide (DEMADEX) 20 MG tablet Take 2 tablets (40 mg total) in the morning and 1 tablet (20 mg total) in the evening. 11/09/20   Sueanne Margarita, MD  traZODone (DESYREL) 100 MG tablet Take 200 mg by mouth at bedtime.    [provider]  venlafaxine XR (EFFEXOR-XR) 75 MG 24 hr capsule Take 225 mg by mouth daily with breakfast.    [provider]  Vitamin D, Ergocalciferol, (DRISDOL) 1.25 MG (50000 UNIT) CAPS capsule Take 50,000 Units by mouth every 7 (seven) days.    [provider]    Allergies    Vancomycin, Niacin, Niacin and related, Tubersol [tuberculin], and Doxycycline  Review of Systems   Review of Systems  Constitutional: Negative for chills and fever.  HENT: Negative for ear pain and sore throat.   Eyes: Negative for pain and visual disturbance.  Respiratory: Negative for cough and shortness of breath.   Cardiovascular: Positive for chest pain and leg swelling. Negative for palpitations.  Gastrointestinal: Negative for abdominal pain and vomiting.  Genitourinary: Negative for dysuria and hematuria.  Musculoskeletal: Negative for arthralgias and back pain.  Skin: Negative for color  change and rash.  Neurological: Negative for seizures and syncope.  All other systems reviewed and are negative.   Physical Exam Updated Vital Signs BP (!) 127/57 (BP Location: Right Arm)  Pulse 85   Temp 98.1 F (36.7 C) (Oral)   Resp 17   Ht $R'6\' 2"'xN$  (1.88 m)   Wt (!) 185.1 kg   SpO2 94%   BMI 52.38 kg/m   Physical Exam Vitals and nursing note reviewed.  Constitutional:      Appearance: He is well-developed.  HENT:     Head: Normocephalic and atraumatic.  Eyes:     Conjunctiva/sclera: Conjunctivae normal.  Cardiovascular:     Rate and Rhythm: Normal rate and regular rhythm.     Heart sounds: No murmur heard.   Pulmonary:     Effort: Pulmonary effort is normal. No respiratory distress.     Breath sounds: Normal breath sounds.  Abdominal:     Palpations: Abdomen is soft.     Tenderness: There is no abdominal tenderness.  Musculoskeletal:     Cervical back: Neck supple.     Right lower leg: Edema present.     Left lower leg: Edema present.     Comments: Bilateral lower extremity redness and erythema.  Patient dates is chronic and at its baseline at this time.  Patient denies any pain at the site or disproportionate swelling.  Skin:    General: Skin is warm and dry.  Neurological:     Mental Status: He is alert.     ED Results / Procedures / Treatments   Labs (all labs ordered are listed, but only abnormal results are displayed) Labs Reviewed  CBC WITH DIFFERENTIAL/PLATELET - Abnormal; Notable for the following components:      Result Value   WBC 11.2 (*)    RBC 4.21 (*)    Hemoglobin 12.3 (*)    HCT 38.4 (*)    Neutro Abs 8.4 (*)    Eosinophils Absolute 0.6 (*)    Abs Immature Granulocytes 0.12 (*)    All other components within normal limits  COMPREHENSIVE METABOLIC PANEL - Abnormal; Notable for the following components:   Glucose, Bld 182 (*)    Calcium 8.7 (*)    Albumin 3.3 (*)    All other components within normal limits  CBG MONITORING, ED -  Abnormal; Notable for the following components:   Glucose-Capillary 194 (*)    All other components within normal limits  SARS CORONAVIRUS 2 (TAT 6-24 HRS)  BRAIN NATRIURETIC PEPTIDE  LIPASE, BLOOD  TROPONIN I (HIGH SENSITIVITY)  TROPONIN I (HIGH SENSITIVITY)    EKG EKG Interpretation  Date/Time:  Friday November 13 2020 15:03:44 EDT Ventricular Rate:  85 PR Interval:    QRS Duration: 115 QT Interval:  397 QTC Calculation: 473 R Axis:   12 Text Interpretation: Atrial fibrillation Nonspecific intraventricular conduction delay Low voltage, precordial leads Confirmed by Gerlene Fee [phone]) on 11/13/2020 4:12:11 PM   Radiology DG Chest Portable 1 View  Result Date: 11/13/2020 CLINICAL DATA:  Chest pain, shortness of breath and dizziness for 3 hours. EXAM: PORTABLE CHEST 1 VIEW COMPARISON:  Radiograph 10/16/2020 FINDINGS: Stable cardiomegaly. Unchanged mediastinal contours. Subsegmental atelectasis or scarring in the lung bases. No acute or confluent airspace disease. Unchanged peribronchial thickening. No large pleural effusion or pneumothorax. No acute osseous abnormalities are seen. IMPRESSION: 1. Stable cardiomegaly. Subsegmental bibasilar atelectasis or scarring. 2. Unchanged peribronchial thickening Electronically Signed   By: Keith Rake M.D.   On: 11/13/2020 16:02     Medications Ordered in ED Medications  oxyCODONE (Oxy IR/ROXICODONE) immediate release tablet 5 mg (5 mg Oral Given 11/13/20 2207)    ED Course  I have  reviewed the triage vital signs and the nursing notes.  Pertinent labs & imaging results that were available during my care of the patient were reviewed by me and considered in my medical decision making (see chart for details).    MDM Rules/Calculators/A&P HEAR Score: 4                        Medical Decision Making:  Mischa Pollard is a 64 y.o. male who presented to the ED today with chest pain.  Patient's pain started 2 hours ago.  Based on patient's  comorbidities, patient has a heart score of 4.  On my initial exam, the pt was in no acute distress.  Patient GCS 15 communicating clearly.  Vital signs notable for no acute abnormalities. Reviewed and confirmed nursing documentation for past medical history, family history, social history.   Initial Assessment:  With the patient's presentation of left-sided chest pain, most likely diagnose is musculoskeletal chest pain versus ACS. Other diagnoses were considered including (but not limited to) pulmonary embolism, community-acquired pneumonia, aortic dissection, pneumothorax, underlying bony abnormality, anemia. These are considered less likely due to history of present illness and physical exam findings.  In particular, concerning pulmonary embolism: Patient is PERC positive and the they deny malignancy, hormone usage, history of DVT,  or calf tenderness leading to a low risk Wells score.  Initial Plan: 1. Evaluate for ACS with delta troponin and EKG evaluated as below 2. Evaluate for dissection, bony abnormality, or pneumonia with chest x-ray and screening laboratory evaluation including CBC, BMP 3. Further evaluation for pulmonary embolism not indicated at this time based on patient's PERC and Wells score.    Initial Study Results:  EKG was reviewed independently and under the supervision of the attending provider. Rate, rhythm, axis, intervals all examined and without medically relevant abnormality.   Laboratory . Delta troponin demonstrated no acute abnormality . CBC and BMP without obvious metabolic or inflammatory abnormalities requiring further evaluation  Radiology DG Chest Portable 1 View  Final Result      Final Assessment and Plan:  Discussed the patient's case with his cardiology team.  They stated they would communicate with his primary cardiologist but that the plan would be for outpatient follow-up.  He is already scheduled for a nuclear medicine perfusion scan in the  outpatient setting and they will attempt to move it up.  Given patient's history of present illness and physical exam findings, there is no acute indication of unstable angina.  Patient in no acute distress at this time and is stable for continued outpatient evaluation and management.  Patient's bilateral lower extremities with soft tissue redness and swelling which patient's wife now states is worse.  Patient recently finished his home prescription for Keflex and has a follow-up with his primary care provider.  Will represcribe Keflex for home use until he is able to reinitiate with his primary care provider.  Final Clinical Impression(s) / ED Diagnoses Final diagnoses:  Chest pain, unspecified type  Cellulitis of lower extremity, unspecified laterality    Rx / DC Orders ED Discharge Orders         Ordered    cephALEXin (KEFLEX) 500 MG capsule  2 times daily        11/13/20 1939           Tretha Sciara, MD 11/13/20 [id number]    Tretha Sciara, MD 11/13/20 8938    Maudie Flakes, MD 11/15/20 0222

## 2020-11-13 NOTE — ED Notes (Signed)
Per [facility] patient is 6th on the list at this moment

## 2020-11-13 NOTE — ED Triage Notes (Signed)
Here for chest pain, sob and dizziness x 3 hours. Alert and oriented x 4.

## 2020-11-13 NOTE — ED Notes (Signed)
Called [facility] for patient to be transported to home

## 2020-11-13 NOTE — ED Notes (Signed)
Pt sees a cardiologist and has a holter monitor in place. Came here for left sided chest pain that is ongoing since April 1 with dyspnea on exertion and dizziness. Pt also has noted bilateral leg swelling. Takes Lasix at home. Alert and oriented x 4.

## 2020-11-13 NOTE — ED Notes (Addendum)
Pt resting in bed @ this time w/ NAD noted. VSS. Cardiac monitoring in place w/ bp cycling and spo2 being monitored. Discharge instructions reviewed w/ pt. Denied questions or concerns. T for cellulitis workup WNL. Prescription for abx givenb to pt. Awaiting ride from [place]. NAD.

## 2020-11-13 NOTE — Telephone Encounter (Signed)
Discussed with our ED colleagues- Mr. Rohr came in with CP syndrome with negative novel troponin testing that has now resolved.  Given the sub-optimal quality of the NM Camera here, wanted to see if we could get him NM Stress moved up.  Thanks!  Werner Lean, MD

## 2020-11-14 DIAGNOSIS — R6889 Other general symptoms and signs: Secondary | ICD-10-CM | POA: Diagnosis not present

## 2020-11-14 DIAGNOSIS — Z743 Need for continuous supervision: Secondary | ICD-10-CM | POA: Diagnosis not present

## 2020-11-14 DIAGNOSIS — R279 Unspecified lack of coordination: Secondary | ICD-10-CM | POA: Diagnosis not present

## 2020-11-14 DIAGNOSIS — R079 Chest pain, unspecified: Secondary | ICD-10-CM | POA: Diagnosis not present

## 2020-11-14 DIAGNOSIS — R0789 Other chest pain: Secondary | ICD-10-CM | POA: Diagnosis not present

## 2020-11-14 NOTE — ED Notes (Signed)
Pt given another sanwich bag and PRN pain medication. Denies needs or concerns @ this time w/  NAD. Bed low and locked. Side rails up 2.

## 2020-11-14 NOTE — ED Notes (Signed)
MD made aware opt missed Keppra dose this AM. Pt given ginger ale per request. VSS. Aware awaiting transport home. Verbalized understnaind

## 2020-11-14 NOTE — ED Notes (Signed)
Pt resting in bed @ this time w/  NAD noted. Denies needs or concerns.

## 2020-11-14 NOTE — ED Notes (Addendum)
[facility] @ bedside. Pt a/0 x 4. NAD noted. VSS.  Left on stretcher. Joking and laughing.

## 2020-11-16 ENCOUNTER — Telehealth (HOSPITAL_COMMUNITY): Payer: Self-pay | Admitting: *Deleted

## 2020-11-16 NOTE — Telephone Encounter (Signed)
Will route to Dr. Theodosia Blender nurse and [facility] team to see about sooner appt.

## 2020-11-16 NOTE — Telephone Encounter (Signed)
The patient's stress test is now scheduled 11/24/20.

## 2020-11-16 NOTE — Telephone Encounter (Signed)
Patient given detailed instructions per Myocardial Perfusion Study Information Sheet for the test on 11/24/20. Patient notified to arrive 15 minutes early and that it is imperative to arrive on time for appointment to keep from having the test rescheduled.  If you need to cancel or reschedule your appointment, please call the office within 24 hours of your appointment. . Patient verbalized understanding. Kirstie Peri

## 2020-11-17 DIAGNOSIS — G4700 Insomnia, unspecified: Secondary | ICD-10-CM | POA: Diagnosis not present

## 2020-11-17 DIAGNOSIS — J449 Chronic obstructive pulmonary disease, unspecified: Secondary | ICD-10-CM | POA: Diagnosis not present

## 2020-11-17 DIAGNOSIS — I5033 Acute on chronic diastolic (congestive) heart failure: Secondary | ICD-10-CM | POA: Diagnosis not present

## 2020-11-17 DIAGNOSIS — E1151 Type 2 diabetes mellitus with diabetic peripheral angiopathy without gangrene: Secondary | ICD-10-CM | POA: Diagnosis not present

## 2020-11-17 DIAGNOSIS — Z794 Long term (current) use of insulin: Secondary | ICD-10-CM | POA: Diagnosis not present

## 2020-11-17 DIAGNOSIS — I130 Hypertensive heart and chronic kidney disease with heart failure and stage 1 through stage 4 chronic kidney disease, or unspecified chronic kidney disease: Secondary | ICD-10-CM | POA: Diagnosis not present

## 2020-11-17 DIAGNOSIS — L97812 Non-pressure chronic ulcer of other part of right lower leg with fat layer exposed: Secondary | ICD-10-CM | POA: Diagnosis not present

## 2020-11-17 DIAGNOSIS — I471 Supraventricular tachycardia: Secondary | ICD-10-CM | POA: Diagnosis not present

## 2020-11-17 DIAGNOSIS — Z4800 Encounter for change or removal of nonsurgical wound dressing: Secondary | ICD-10-CM | POA: Diagnosis not present

## 2020-11-17 DIAGNOSIS — I872 Venous insufficiency (chronic) (peripheral): Secondary | ICD-10-CM | POA: Diagnosis not present

## 2020-11-17 DIAGNOSIS — E785 Hyperlipidemia, unspecified: Secondary | ICD-10-CM | POA: Diagnosis not present

## 2020-11-17 DIAGNOSIS — E1140 Type 2 diabetes mellitus with diabetic neuropathy, unspecified: Secondary | ICD-10-CM | POA: Diagnosis not present

## 2020-11-17 DIAGNOSIS — E1122 Type 2 diabetes mellitus with diabetic chronic kidney disease: Secondary | ICD-10-CM | POA: Diagnosis not present

## 2020-11-17 DIAGNOSIS — M109 Gout, unspecified: Secondary | ICD-10-CM | POA: Diagnosis not present

## 2020-11-17 DIAGNOSIS — Z79899 Other long term (current) drug therapy: Secondary | ICD-10-CM | POA: Diagnosis not present

## 2020-11-17 DIAGNOSIS — G4733 Obstructive sleep apnea (adult) (pediatric): Secondary | ICD-10-CM | POA: Diagnosis not present

## 2020-11-17 DIAGNOSIS — L97822 Non-pressure chronic ulcer of other part of left lower leg with fat layer exposed: Secondary | ICD-10-CM | POA: Diagnosis not present

## 2020-11-17 DIAGNOSIS — N183 Chronic kidney disease, stage 3 unspecified: Secondary | ICD-10-CM | POA: Diagnosis not present

## 2020-11-17 DIAGNOSIS — I2510 Atherosclerotic heart disease of native coronary artery without angina pectoris: Secondary | ICD-10-CM | POA: Diagnosis not present

## 2020-11-19 ENCOUNTER — Other Ambulatory Visit: Payer: Self-pay

## 2020-11-19 ENCOUNTER — Ambulatory Visit: Payer: Medicare Other | Admitting: Adult Health

## 2020-11-19 ENCOUNTER — Telehealth: Payer: Self-pay | Admitting: Endocrinology

## 2020-11-19 ENCOUNTER — Ambulatory Visit (INDEPENDENT_AMBULATORY_CARE_PROVIDER_SITE_OTHER): Payer: No Typology Code available for payment source | Admitting: Endocrinology

## 2020-11-19 VITALS — BP 112/76 | HR 99 | Ht 74.0 in | Wt >= 6400 oz

## 2020-11-19 DIAGNOSIS — E1159 Type 2 diabetes mellitus with other circulatory complications: Secondary | ICD-10-CM | POA: Diagnosis not present

## 2020-11-19 LAB — POCT GLYCOSYLATED HEMOGLOBIN (HGB A1C): Hemoglobin A1C: 8.6 % — AB (ref 4.0–5.6)

## 2020-11-19 MED ORDER — LANTUS SOLOSTAR 100 UNIT/ML ~~LOC~~ SOPN: 12.0000 [IU] | PEN_INJECTOR | SUBCUTANEOUS | 3 refills | Status: DC

## 2020-11-19 MED ORDER — TRULICITY 4.5 MG/0.5ML ~~LOC~~ SOAJ: 4.5000 mg | SUBCUTANEOUS | 3 refills | Status: DC

## 2020-11-19 NOTE — Progress Notes (Signed)
Subjective:    Patient ID: Hasan Douse, male    DOB: November 23, 1955, 65 y.o.   MRN: [id number]  HPI pt is referred by [name], PA, for diabetes.  Wife provides some hx, due to pt's memory loss; Pt states DM was dx'ed in 2263; it is complicated by PPN and leg ulcers; he has been on insulin since early 2022; pt says his diet is good, but exercise is limited by OA; he has never had pancreatitis, pancreatic surgery, severe hypoglycemia or DKA.  He takes Lantus 15 units qd, Trulicity, and metformin.  Pt says cbg varies from 90-222.  It is in general higher as the day goes on.   Past Medical History:  Diagnosis Date  . Acute on chronic diastolic CHF (congestive heart failure) ([place]) 01/13/2018  . Acute respiratory failure with hypoxia ([place]) 01/13/2018  . Adenomatous colon polyp 2007  . Anasarca 01/31/2017  . Anemia   . Anxiety   . Arthritis   . Asthma   . Bilateral lower extremity edema 01/31/2017  . Bilateral lower leg cellulitis   . BPH without obstruction/lower urinary tract symptoms 02/22/2017  . Cellulitis 11/07/2017  . Cellulitis of lower extremity   . Chest pain   . Chronic cough   . Chronic diastolic CHF (congestive heart failure) ([place])   . Chronic venous stasis 03/07/2019  . COPD (chronic obstructive pulmonary disease) ([place])   . Coronary artery calcification seen on CAT scan   . Depression   . Diabetic neuropathy ([place]) 09/11/2019  . Hematuria 02/22/2017  . History of anemia 12/26/2018  . History of cardiac cath    a.  01/2018 with angiographically minimal coronary disease with  up to 40% prox LAD and 20% mid LCx and OM2 with normal LVF and normal LVEDP..  . History of colon polyps 08/24/2018  . Hypertension   . Leukocytosis 08/18/2018  . Morbid obesity ([place])   . New onset type 2 diabetes mellitus ([place]) 11/08/2017  . OSA (obstructive sleep apnea)   . Pain due to onychomycosis of toenails of both feet 09/11/2019  . Peripheral neuropathy 02/22/2017  . Physical deconditioning 02/22/2017  .  Primary osteoarthritis, left shoulder 03/05/2017  . PTSD (post-traumatic stress disorder)   . Pure hypercholesterolemia   . QT prolongation 03/07/2019  . Right foot pain 01/13/2018  . Shortness of breath   . Sinus tachycardia 03/07/2019  . Sleep apnea    CPAP  . Type 2 diabetes mellitus with vascular disease ([place]) 09/11/2019    Past Surgical History:  Procedure Laterality Date  . ENDOVENOUS ABLATION SAPHENOUS VEIN W/ LASER Right 08/20/2020   endovenous laser ablation right greater saphenous vein by Gae Gallop MD   . [place]     left knee replacement x 2  . KNEE ARTHROSCOPY Bilateral   . LEFT HEART CATH AND CORONARY ANGIOGRAPHY N/A 01/17/2018   Procedure: LEFT HEART CATH AND CORONARY ANGIOGRAPHY;  Surgeon: Leonie Man, MD;  Location: [facility];  Service: Cardiovascular;  Laterality: N/A;  . UMBILICAL HERNIA REPAIR      Social History   Socioeconomic History  . Marital status: Single    Spouse name: Not on file  . Number of children: 1  . Years of education: Not on file  . Highest education level: Not on file  Occupational History  . Occupation: retired  Tobacco Use  . Smoking status: Former Smoker    Quit date: 03/07/2016    Years since quitting: 4.7  . Smokeless  tobacco: Never Used  Substance and Sexual Activity  . Alcohol use: Not Currently    Comment: rare  . Drug use: No  . Sexual activity: Not on file  Other Topics Concern  . Not on file  Social History Narrative  . Not on file   Social Determinants of Health   Financial Resource Strain: Not on file  Food Insecurity: Not on file  Transportation Needs: Not on file  Physical Activity: Not on file  Stress: Not on file  Social Connections: Not on file  Intimate Partner Violence: Not on file    Current Outpatient Medications on File Prior to Visit  Medication Sig Dispense Refill  . acetaminophen (TYLENOL) 650 MG CR tablet Take 1,300 mg by mouth every 8 (eight) hours as needed for pain.     [name] albuterol (VENTOLIN HFA) 108 (90 Base) MCG/ACT inhaler Inhale into the lungs.    [name] allopurinol (ZYLOPRIM) 100 MG tablet Take 100 mg by mouth 2 (two) times daily.    . ARIPiprazole (ABILIFY) 5 MG tablet Take 5 mg by mouth at bedtime.     [name] aspirin EC 81 MG EC tablet Take 1 tablet (81 mg total) by mouth daily. Swallow whole. 30 tablet 0  . atorvastatin (LIPITOR) 40 MG tablet TAKE ONE TABLET BY MOUTH DAILY AT 6PM 90 tablet 2  . blood glucose meter kit and supplies Dispense based on patient and insurance preference. Use up to four times daily as directed. (FOR ICD-10 E10.9, E11.9). 1 each 0  . cephALEXin (KEFLEX) 500 MG capsule Take 1 capsule (500 mg total) by mouth 2 (two) times daily. 20 capsule 0  . cetirizine (ZYRTEC) 10 MG tablet Take 10 mg by mouth at bedtime.     . ferrous sulfate 324 (65 Fe) MG TBEC Take 324 mg by mouth daily with breakfast.     . fluticasone (FLONASE) 50 MCG/ACT nasal spray Place 2 sprays into both nostrils daily.     [name] levETIRAcetam (KEPPRA) 1000 MG tablet Take 1 tablet (1,000 mg total) by mouth 2 (two) times daily. 60 tablet 0  . metFORMIN (GLUCOPHAGE) 1000 MG tablet Take 1,000 mg by mouth 2 (two) times daily with a meal.    . metoprolol succinate (TOPROL-XL) 25 MG 24 hr tablet Take 1 tablet (25 mg total) by mouth daily. 30 tablet 0  . omeprazole (PRILOSEC) 20 MG capsule Take 20 mg by mouth every morning.    [name] oxyCODONE (OXY IR/ROXICODONE) 5 MG immediate release tablet Take 5 mg by mouth every 8 (eight) hours as needed.    . potassium chloride SA (KLOR-CON) 20 MEQ tablet Take 2 tablets (40 mEq total) by mouth daily. 60 tablet 0  . prazosin (MINIPRESS) 2 MG capsule Take 4 mg by mouth at bedtime.    . pregabalin (LYRICA) 100 MG capsule Take 100 mg by mouth in the morning, at noon, and at bedtime.     . silver sulfADIAZINE (SILVADENE) 1 % cream Apply 1 application topically See admin instructions. Apply as directed once a day as directed to bilateral legs    .  spironolactone (ALDACTONE) 25 MG tablet Take 1 tablet (25 mg total) by mouth daily. 10 tablet 0  . temazepam (RESTORIL) 15 MG capsule Take 15 mg by mouth at bedtime.     . torsemide (DEMADEX) 20 MG tablet Take 2 tablets (40 mg total) in the morning and 1 tablet (20 mg total) in the evening. 270 tablet 3  . traZODone (DESYREL) 100 MG  tablet Take 200 mg by mouth at bedtime.    [name] venlafaxine XR (EFFEXOR-XR) 75 MG 24 hr capsule Take 225 mg by mouth daily with breakfast.    . Vitamin D, Ergocalciferol, (DRISDOL) 1.25 MG (50000 UNIT) CAPS capsule Take 50,000 Units by mouth every 7 (seven) days.     No current facility-administered medications on file prior to visit.    Allergies  Allergen Reactions  . Vancomycin Other (See Comments)    "Red Man Syndrome" 02/02/17: possible cause for rash under both arms  . Niacin Other (See Comments)  . Niacin And Related Other (See Comments)    Red man syndrome  . Tubersol [Tuberculin] Other (See Comments)    Reaction unknown  . Doxycycline Rash and Other (See Comments)    Family History  Problem Relation Age of Onset  . CAD Maternal Grandfather   . Diabetes Other   . Diabetes Mellitus II Neg Hx   . Colon cancer Neg Hx   . Esophageal cancer Neg Hx   . Inflammatory bowel disease Neg Hx   . Liver disease Neg Hx   . Pancreatic cancer Neg Hx   . Rectal cancer Neg Hx   . Stomach cancer Neg Hx     BP 112/76 (BP Location: Right Arm, Patient Position: Sitting, Cuff Size: Large)   Pulse 99   Ht _0  (1.88 m)   Wt (!) 402 lb (182.3 kg) Comment: Pateint reported  SpO2 92%   BMI 51.61 kg/m     Review of Systems denies blurry vision, and n/v.  depression is well-controlled.  No change in chronic doe.  He has lost weight--intentional.      Objective:   Physical Exam VITAL SIGNS:  See vs page GENERAL: no distress.  In wheelchair Pulses: dorsalis pedis intact bilat.   MSK: no deformity of the feet CV: 2+ bilat leg edema.   Skin:  no ulcer on the  feet.  normal temp on the feet, but there is hyperpigmentation of the legs and feet.  There is skin thickening and mild erythema of both legs Neuro: sensation is intact to touch on the feet, but decreased from normal.   Ext: there is bilateral onychomycosis of the toenails.    Lab Results  Component Value Date   HGBA1C 8.6 (A) 11/19/2020   Lab Results  Component Value Date   CREATININE 1.15 11/13/2020   BUN 20 11/13/2020   NA 138 11/13/2020   K 3.9 11/13/2020   CL 99 11/13/2020   CO2 29 11/13/2020   I have reviewed outside records, and summarized: Pt was noted to have elevated A1c, and referred here.  He is seen by cardiol for several types of heart probs      Assessment & Plan:  Insulin-requiring type 2 DM: new to me: uncontrolled  Patient Instructions  good diet and exercise significantly improve the control of your diabetes.  please let me know if you wish to be referred to a dietician.  high blood sugar is very risky to your health.  you should see an eye doctor and dentist every year.  It is very important to get all recommended vaccinations.  Controlling your blood pressure and cholesterol drastically reduces the damage diabetes does to your body.  Those who smoke should quit.  Please discuss these with your doctor.  check your blood sugar twice a day.  vary the time of day when you check, between before the 3 meals, and at bedtime.  also check  if you have symptoms of your blood sugar being too high or too low.  please keep a record of the readings and bring it to your next appointment here (or you can bring the meter itself).  You can write it on any piece of paper.  please call us sooner if your blood sugar goes below 70, or if most of your readings are over 200. We will need to take this complex situation in stages. For now, please increase the Trulicity to 4.5 mg weekly, and: Reduce the Lantus to 12 units each morning, and: Please continue the same metformin.  There are  other meds we can add on if necessary.   Please come back for a follow-up appointment in 6 weeks.

## 2020-11-19 NOTE — Telephone Encounter (Signed)
Ally from [employer] is calling regardingDulaglutide (TRULICITY) 4.5 XM/4.6OE SOPN Each box is 2 ml which is a 28 day supply wondering if the 28 or 84 day supply is needed for the patient    [facility] - [place], [place] - [address]

## 2020-11-19 NOTE — Patient Instructions (Addendum)
good diet and exercise significantly improve the control of your diabetes.  please let me know if you wish to be referred to a dietician.  high blood sugar is very risky to your health.  you should see an eye doctor and dentist every year.  It is very important to get all recommended vaccinations.  Controlling your blood pressure and cholesterol drastically reduces the damage diabetes does to your body.  Those who smoke should quit.  Please discuss these with your doctor.  check your blood sugar twice a day.  vary the time of day when you check, between before the 3 meals, and at bedtime.  also check if you have symptoms of your blood sugar being too high or too low.  please keep a record of the readings and bring it to your next appointment here (or you can bring the meter itself).  You can write it on any piece of paper.  please call us sooner if your blood sugar goes below 70, or if most of your readings are over 200. We will need to take this complex situation in stages. For now, please increase the Trulicity to 4.5 mg weekly, and: Reduce the Lantus to 12 units each morning, and: Please continue the same metformin.  There are other meds we can add on if necessary.   Please come back for a follow-up appointment in 6 weeks.

## 2020-11-24 ENCOUNTER — Ambulatory Visit (HOSPITAL_COMMUNITY): Payer: Medicare Other | Attending: Cardiovascular Disease

## 2020-11-24 ENCOUNTER — Other Ambulatory Visit: Payer: Self-pay

## 2020-11-24 DIAGNOSIS — L97812 Non-pressure chronic ulcer of other part of right lower leg with fat layer exposed: Secondary | ICD-10-CM | POA: Diagnosis not present

## 2020-11-24 DIAGNOSIS — L97822 Non-pressure chronic ulcer of other part of left lower leg with fat layer exposed: Secondary | ICD-10-CM | POA: Diagnosis not present

## 2020-11-24 DIAGNOSIS — E1140 Type 2 diabetes mellitus with diabetic neuropathy, unspecified: Secondary | ICD-10-CM | POA: Diagnosis not present

## 2020-11-24 DIAGNOSIS — J449 Chronic obstructive pulmonary disease, unspecified: Secondary | ICD-10-CM | POA: Diagnosis not present

## 2020-11-24 DIAGNOSIS — M109 Gout, unspecified: Secondary | ICD-10-CM | POA: Diagnosis not present

## 2020-11-24 DIAGNOSIS — G4733 Obstructive sleep apnea (adult) (pediatric): Secondary | ICD-10-CM | POA: Diagnosis not present

## 2020-11-24 DIAGNOSIS — Z79899 Other long term (current) drug therapy: Secondary | ICD-10-CM | POA: Diagnosis not present

## 2020-11-24 DIAGNOSIS — Z794 Long term (current) use of insulin: Secondary | ICD-10-CM | POA: Diagnosis not present

## 2020-11-24 DIAGNOSIS — I471 Supraventricular tachycardia: Secondary | ICD-10-CM | POA: Diagnosis not present

## 2020-11-24 DIAGNOSIS — E1151 Type 2 diabetes mellitus with diabetic peripheral angiopathy without gangrene: Secondary | ICD-10-CM | POA: Diagnosis not present

## 2020-11-24 DIAGNOSIS — I5033 Acute on chronic diastolic (congestive) heart failure: Secondary | ICD-10-CM | POA: Diagnosis not present

## 2020-11-24 DIAGNOSIS — E785 Hyperlipidemia, unspecified: Secondary | ICD-10-CM | POA: Diagnosis not present

## 2020-11-24 DIAGNOSIS — I2510 Atherosclerotic heart disease of native coronary artery without angina pectoris: Secondary | ICD-10-CM | POA: Diagnosis not present

## 2020-11-24 DIAGNOSIS — G4700 Insomnia, unspecified: Secondary | ICD-10-CM | POA: Diagnosis not present

## 2020-11-24 DIAGNOSIS — R072 Precordial pain: Secondary | ICD-10-CM | POA: Insufficient documentation

## 2020-11-24 DIAGNOSIS — E1122 Type 2 diabetes mellitus with diabetic chronic kidney disease: Secondary | ICD-10-CM | POA: Diagnosis not present

## 2020-11-24 DIAGNOSIS — I130 Hypertensive heart and chronic kidney disease with heart failure and stage 1 through stage 4 chronic kidney disease, or unspecified chronic kidney disease: Secondary | ICD-10-CM | POA: Diagnosis not present

## 2020-11-24 DIAGNOSIS — N183 Chronic kidney disease, stage 3 unspecified: Secondary | ICD-10-CM | POA: Diagnosis not present

## 2020-11-24 DIAGNOSIS — Z4800 Encounter for change or removal of nonsurgical wound dressing: Secondary | ICD-10-CM | POA: Diagnosis not present

## 2020-11-24 DIAGNOSIS — I872 Venous insufficiency (chronic) (peripheral): Secondary | ICD-10-CM | POA: Diagnosis not present

## 2020-11-24 MED ORDER — ADENOSINE (DIAGNOSTIC) 3 MG/ML IV SOLN
0.5600 mg/kg | Freq: Once | INTRAVENOUS | Status: AC
  Administered 2020-11-24: 60 mg via INTRAVENOUS

## 2020-11-24 MED ORDER — TECHNETIUM TC 99M TETROFOSMIN IV KIT
32.4000 | PACK | Freq: Once | INTRAVENOUS | Status: AC | PRN
Start: 2020-11-24 — End: 2020-11-24
  Administered 2020-11-24: 32.4 via INTRAVENOUS
  Filled 2020-11-24: qty 33

## 2020-11-24 NOTE — Telephone Encounter (Signed)
Sent Rx to the pharmacy

## 2020-11-25 NOTE — Telephone Encounter (Signed)
Ally from [employer] is calling regarding getting clarification on the Trulicity RX sent to them.  Needs verbal clarification regarding dosing amount and instructions.  30-60-90 days ??  Call back # [phone]

## 2020-11-25 NOTE — Telephone Encounter (Signed)
Spoke with Jon Gills at the pharmacy to give clarification on Trulicity.

## 2020-11-26 ENCOUNTER — Ambulatory Visit (HOSPITAL_COMMUNITY): Payer: No Typology Code available for payment source | Attending: Cardiology

## 2020-11-26 ENCOUNTER — Other Ambulatory Visit: Payer: Self-pay

## 2020-11-26 LAB — MYOCARDIAL PERFUSION IMAGING
LV dias vol: 113 mL (ref 62–150)
LV sys vol: 51 mL
Peak HR: 94 {beats}/min
Rest HR: 90 {beats}/min
SDS: 2
SRS: 0
SSS: 2
TID: 1.26

## 2020-11-26 MED ORDER — TECHNETIUM TC 99M TETROFOSMIN IV KIT
32.4000 | PACK | Freq: Once | INTRAVENOUS | Status: AC | PRN
  Administered 2020-11-26: 32.4 via INTRAVENOUS
  Filled 2020-11-26: qty 33

## 2020-11-30 ENCOUNTER — Telehealth: Payer: Self-pay | Admitting: Cardiology

## 2020-11-30 DIAGNOSIS — E1169 Type 2 diabetes mellitus with other specified complication: Secondary | ICD-10-CM | POA: Diagnosis not present

## 2020-11-30 DIAGNOSIS — G4700 Insomnia, unspecified: Secondary | ICD-10-CM | POA: Diagnosis not present

## 2020-11-30 DIAGNOSIS — E1165 Type 2 diabetes mellitus with hyperglycemia: Secondary | ICD-10-CM | POA: Diagnosis not present

## 2020-11-30 DIAGNOSIS — M1990 Unspecified osteoarthritis, unspecified site: Secondary | ICD-10-CM | POA: Diagnosis not present

## 2020-11-30 DIAGNOSIS — I10 Essential (primary) hypertension: Secondary | ICD-10-CM | POA: Diagnosis not present

## 2020-11-30 DIAGNOSIS — J449 Chronic obstructive pulmonary disease, unspecified: Secondary | ICD-10-CM | POA: Diagnosis not present

## 2020-11-30 DIAGNOSIS — E785 Hyperlipidemia, unspecified: Secondary | ICD-10-CM | POA: Diagnosis not present

## 2020-11-30 DIAGNOSIS — I5032 Chronic diastolic (congestive) heart failure: Secondary | ICD-10-CM | POA: Diagnosis not present

## 2020-11-30 MED ORDER — POTASSIUM CHLORIDE CRYS ER 20 MEQ PO TBCR: 40.0000 meq | EXTENDED_RELEASE_TABLET | Freq: Every day | ORAL | 3 refills | Status: DC

## 2020-11-30 NOTE — Telephone Encounter (Signed)
*  STAT* If patient is at the pharmacy, call can be transferred to refill team.   1. Which medications need to be refilled? (please list name of each medication and dose if known) potassium chloride SA (KLOR-CON) 20 MEQ tablet(Expired)  2. Which pharmacy/location (including street and city if local pharmacy) is medication to be sent to?[facility] - [place], [place] - [address]  3. Do they need a 30 day or 90 day supply? 90 day  PT is all out of this medication.

## 2020-12-01 DIAGNOSIS — E1122 Type 2 diabetes mellitus with diabetic chronic kidney disease: Secondary | ICD-10-CM | POA: Diagnosis not present

## 2020-12-01 DIAGNOSIS — M792 Neuralgia and neuritis, unspecified: Secondary | ICD-10-CM | POA: Diagnosis not present

## 2020-12-01 DIAGNOSIS — N183 Chronic kidney disease, stage 3 unspecified: Secondary | ICD-10-CM | POA: Diagnosis not present

## 2020-12-01 DIAGNOSIS — I872 Venous insufficiency (chronic) (peripheral): Secondary | ICD-10-CM | POA: Diagnosis not present

## 2020-12-01 DIAGNOSIS — J449 Chronic obstructive pulmonary disease, unspecified: Secondary | ICD-10-CM | POA: Diagnosis not present

## 2020-12-01 DIAGNOSIS — L97812 Non-pressure chronic ulcer of other part of right lower leg with fat layer exposed: Secondary | ICD-10-CM | POA: Diagnosis not present

## 2020-12-01 DIAGNOSIS — Z4800 Encounter for change or removal of nonsurgical wound dressing: Secondary | ICD-10-CM | POA: Diagnosis not present

## 2020-12-01 DIAGNOSIS — G4733 Obstructive sleep apnea (adult) (pediatric): Secondary | ICD-10-CM | POA: Diagnosis not present

## 2020-12-01 DIAGNOSIS — I471 Supraventricular tachycardia: Secondary | ICD-10-CM | POA: Diagnosis not present

## 2020-12-01 DIAGNOSIS — I5033 Acute on chronic diastolic (congestive) heart failure: Secondary | ICD-10-CM | POA: Diagnosis not present

## 2020-12-01 DIAGNOSIS — E785 Hyperlipidemia, unspecified: Secondary | ICD-10-CM | POA: Diagnosis not present

## 2020-12-01 DIAGNOSIS — G4700 Insomnia, unspecified: Secondary | ICD-10-CM | POA: Diagnosis not present

## 2020-12-01 DIAGNOSIS — I130 Hypertensive heart and chronic kidney disease with heart failure and stage 1 through stage 4 chronic kidney disease, or unspecified chronic kidney disease: Secondary | ICD-10-CM | POA: Diagnosis not present

## 2020-12-01 DIAGNOSIS — I2510 Atherosclerotic heart disease of native coronary artery without angina pectoris: Secondary | ICD-10-CM | POA: Diagnosis not present

## 2020-12-01 DIAGNOSIS — E1140 Type 2 diabetes mellitus with diabetic neuropathy, unspecified: Secondary | ICD-10-CM | POA: Diagnosis not present

## 2020-12-01 DIAGNOSIS — L97822 Non-pressure chronic ulcer of other part of left lower leg with fat layer exposed: Secondary | ICD-10-CM | POA: Diagnosis not present

## 2020-12-01 DIAGNOSIS — Z794 Long term (current) use of insulin: Secondary | ICD-10-CM | POA: Diagnosis not present

## 2020-12-01 DIAGNOSIS — M109 Gout, unspecified: Secondary | ICD-10-CM | POA: Diagnosis not present

## 2020-12-01 DIAGNOSIS — Z79899 Other long term (current) drug therapy: Secondary | ICD-10-CM | POA: Diagnosis not present

## 2020-12-01 DIAGNOSIS — E1151 Type 2 diabetes mellitus with diabetic peripheral angiopathy without gangrene: Secondary | ICD-10-CM | POA: Diagnosis not present

## 2020-12-03 MED ORDER — METOPROLOL SUCCINATE ER 25 MG PO TB24: 25.0000 mg | ORAL_TABLET | Freq: Every day | ORAL | 3 refills | Status: DC

## 2020-12-03 NOTE — Telephone Encounter (Signed)
Pt reports elevated HRs for last 2 weeks, since he "has been out of my Toprol". HRs avg "high 90s", 96 currently, highest 150 on 4/21. BP:  145/91, 136/84, 138/90, 145/69, 152/76 No other symptoms associated (denies sob, dizziness/light headedness, edema/wt gain). He does report continued CP "every now and then" (not related to high HRs), described as "achy pain that kinda shoots down the arm", reports is occurs randomly. He had not discussed this w/ RN following myoview testing. Currently wearing 30 day monitor, on day 28.  Reports that it is not "reading", he is out of strips, and having "poor skin contact" and constantly having to press it onto skin.  Pt advised to go ahead a remove/mail back. Pt advised we should be able to send in Rx for his Metoprolol so he can get restarted today, but will await MD advisement prior to sending. Aware I will forward to Dr. Radford Pax (saw pt last 4/4) for advisement. Will forward note to EP scheduler to get pt scheduled for overdue follow up w/ Dr. Curt Bears. Aware Dr. Theodosia Blender nurse will follow up with him after reviewing w/ her. Patient verbalized understanding and agreeable to plan.

## 2020-12-09 ENCOUNTER — Encounter: Payer: Self-pay | Admitting: Physician Assistant

## 2020-12-09 ENCOUNTER — Ambulatory Visit (INDEPENDENT_AMBULATORY_CARE_PROVIDER_SITE_OTHER): Payer: Medicare Other | Admitting: Physician Assistant

## 2020-12-09 ENCOUNTER — Other Ambulatory Visit: Payer: Self-pay

## 2020-12-09 ENCOUNTER — Ambulatory Visit: Payer: Medicare Other

## 2020-12-09 VITALS — BP 130/78 | HR 100 | Ht 74.0 in | Wt >= 6400 oz

## 2020-12-09 DIAGNOSIS — I471 Supraventricular tachycardia, unspecified: Secondary | ICD-10-CM

## 2020-12-09 DIAGNOSIS — I5032 Chronic diastolic (congestive) heart failure: Secondary | ICD-10-CM | POA: Diagnosis not present

## 2020-12-09 DIAGNOSIS — I10 Essential (primary) hypertension: Secondary | ICD-10-CM | POA: Diagnosis not present

## 2020-12-09 DIAGNOSIS — I480 Paroxysmal atrial fibrillation: Secondary | ICD-10-CM | POA: Diagnosis not present

## 2020-12-09 NOTE — Progress Notes (Addendum)
Cardiology Office Note Date:  12/09/2020  Patient ID:  Lawrence [name], Lawrence [name] [birth year], MRN [id number] PCP:  [name], PA-C  Cardiologist:  Dr. Radford Pax Electrophysiologist: Dr. Curt Bears     Chief Complaint:  Over due, recent increase in palpitations    History of Present Illness: Lawrence [name] is a 65 y.o. male with history of CAD (non-obstructive by cath June 2019), HTN, HLD, chronic CHF (diastolic), OSA w/BIPAP, PTSD, super morbid obesity, depression/anxiety, gout, SVT, CKD (III), DM (follows with Dr. Loanne Drilling)  Pt a poor historian, memory loss  With increased episodes of palpitations and CP, underwent monitoring noted episodes of SVTs 140's-200's and referred to Dr. Curt Bears.   He saw Dr. Curt Bears 04/09/2019, at that time was on metoprolol and propanolol his daily episodes had reduced to weekly.  His Metoprolol succ was increased to $RemoveBefo'50mg'wWlpkuPNNFD$  daily and planned for 6 mo follow up.  Encouraged CPAP compliance.  I saw him Jan 2021 He comes accompanied by his "significant other", mentions that his memory is poor and she takes care of his medicines, care management.  He comes using an Transport planner, though they report he ambulates in the house with a walker, though minimally 2/2 severe arthritis pain b/l hips, and especially his knees. With the last adjustment in his toprol to $RemoveB'75mg'eJYmumzw$  BID, he does nott  ink that he has had any tachycardias.  He mentions his symptoms is an awareness/ache in his chest and unusual SOB is his symptom with the SVT, not particularly palpitations.   He denies any CP, no rest SOB, he uses his CPAP every night, denies any symptoms of PND or orthopnea. He has gained weight though both of them feel this is diet related over the holidays, not fluid retention.  They report his legs look great in comparison to how they h ave been historically. No dizzy spells, no near syncope or syncope. No changes were made.  He saw dr. Radford Pax 11/09/20, discussed hospitalization in March  for HF exacerbation, on telemetry unclear arrhythmia, could not clearly descern if AF or PAT, maintained on BB. Was doing well since discharge, wearing a monitor with finding of short episode of AFib and in d/w EP, continue wearing heart monitor to determine afib burden-studies have recently shown no difference in outcomes in patients with afib burden < 24 hour and risk of CVA off or on DOAC treament He reported some chest discomfort and planned for stress testing. EKG was SR, PACs, low voltage  11/13/20; ER visit with CP HS Trop negative LE erythema edema, with some chronicity recently treated for cellulitis and given another rx for Keflex to f/u with PMD Also rec to f/u with cardiology, in discussion with cards team, keep already planned stress test  K+ 3.8 BUN/Creat 20/1.15 HS Trop 6,6 BNP 40 WBC 11.2 EKG machine read as AFib, in NOT actually AF, is SR with PACs H/H 12/38 plts 235  Pt with my chart message with c/o palpitations, out of his metoprolol, sent an attachment form what appears a kardia device with some artifact/baseline wander,  looks like AFib  Stress test completed and OK, low risk   TODAY He is accompanied by his wife and is using an electric scooter He says that his HR increased and started having palpitations when he ran out of his metoprolol, was without it probably 2 weeks unable to get refills sent in. He has been back on now a week or so and feels much better and palpitations HR have improved.  He  denies any ongoing CP currently, says intermittently gets a nagging ache in the his chest, not exertional, no pattern, randomly comes/goes dates back about a year without escalation or change in behavior no rest SOB, has some baseline DOE No near syncope or syncope.  His edema remains much improved Has some superficial wounds that are healing  Denies any hematological history or bleeding tendencies other then wounds he tends to get on his LE that will ooz  some  Monitor has been returned, not officially read I have reviewed it Report calls overall AFib burden 1% Tracings are at times really difficult, many have significant artifact and lead loss Many are irregular and appear to be Afib He reports resuming metoprolol the day he finished, maybe the day prior to finishing   Past Medical History:  Diagnosis Date  . Acute on chronic diastolic CHF (congestive heart failure) ([place]) 01/13/2018  . Acute respiratory failure with hypoxia ([place]) 01/13/2018  . Adenomatous colon polyp 2007  . Anasarca 01/31/2017  . Anemia   . Anxiety   . Arthritis   . Asthma   . Bilateral lower extremity edema 01/31/2017  . Bilateral lower leg cellulitis   . BPH without obstruction/lower urinary tract symptoms 02/22/2017  . Cellulitis 11/07/2017  . Cellulitis of lower extremity   . Chest pain   . Chronic cough   . Chronic diastolic CHF (congestive heart failure) ([place])   . Chronic venous stasis 03/07/2019  . COPD (chronic obstructive pulmonary disease) ([place])   . Coronary artery calcification seen on CAT scan   . Depression   . Diabetic neuropathy ([place]) 09/11/2019  . Hematuria 02/22/2017  . History of anemia 12/26/2018  . History of cardiac cath    a.  01/2018 with angiographically minimal coronary disease with  up to 40% prox LAD and 20% mid LCx and OM2 with normal LVF and normal LVEDP..  . History of colon polyps 08/24/2018  . Hypertension   . Leukocytosis 08/18/2018  . Morbid obesity ([place])   . New onset type 2 diabetes mellitus ([place]) 11/08/2017  . OSA (obstructive sleep apnea)   . Pain due to onychomycosis of toenails of both feet 09/11/2019  . Peripheral neuropathy 02/22/2017  . Physical deconditioning 02/22/2017  . Primary osteoarthritis, left shoulder 03/05/2017  . PTSD (post-traumatic stress disorder)   . Pure hypercholesterolemia   . QT prolongation 03/07/2019  . Right foot pain 01/13/2018  . Shortness of breath   . Sinus tachycardia 03/07/2019  . Sleep apnea    CPAP   . Type 2 diabetes mellitus with vascular disease ([place]) 09/11/2019    Past Surgical History:  Procedure Laterality Date  . ENDOVENOUS ABLATION SAPHENOUS VEIN W/ LASER Right 08/20/2020   endovenous laser ablation right greater saphenous vein by Gae Gallop MD   . [place]     left knee replacement x 2  . KNEE ARTHROSCOPY Bilateral   . LEFT HEART CATH AND CORONARY ANGIOGRAPHY N/A 01/17/2018   Procedure: LEFT HEART CATH AND CORONARY ANGIOGRAPHY;  Surgeon: Leonie Man, MD;  Location: [facility];  Service: Cardiovascular;  Laterality: N/A;  . UMBILICAL HERNIA REPAIR      Current Outpatient Medications  Medication Sig Dispense Refill  . acetaminophen (TYLENOL) 650 MG CR tablet Take 1,300 mg by mouth every 8 (eight) hours as needed for pain.    [name] albuterol (VENTOLIN HFA) 108 (90 Base) MCG/ACT inhaler Inhale into the lungs.    [name] allopurinol (ZYLOPRIM) 100 MG tablet Take 100  mg by mouth 2 (two) times daily.    . ARIPiprazole (ABILIFY) 5 MG tablet Take 5 mg by mouth at bedtime.     [name] aspirin EC 81 MG EC tablet Take 1 tablet (81 mg total) by mouth daily. Swallow whole. 30 tablet 0  . atorvastatin (LIPITOR) 40 MG tablet TAKE ONE TABLET BY MOUTH DAILY AT 6PM 90 tablet 2  . blood glucose meter kit and supplies Dispense based on patient and insurance preference. Use up to four times daily as directed. (FOR ICD-10 E10.9, E11.9). 1 each 0  . cephALEXin (KEFLEX) 500 MG capsule Take 1 capsule (500 mg total) by mouth 2 (two) times daily. 20 capsule 0  . cetirizine (ZYRTEC) 10 MG tablet Take 10 mg by mouth at bedtime.     . Dulaglutide (TRULICITY) 4.5 UJ/8.1XB SOPN Inject 4.5 mg as directed once a week. 4.5 mL 3  . ferrous sulfate 324 (65 Fe) MG TBEC Take 324 mg by mouth daily with breakfast.     . fluticasone (FLONASE) 50 MCG/ACT nasal spray Place 2 sprays into both nostrils daily.     [name] LANTUS SOLOSTAR 100 UNIT/ML Solostar Pen Inject 12 Units into the skin every morning. 15 mL 3  .  levETIRAcetam (KEPPRA) 1000 MG tablet Take 1 tablet (1,000 mg total) by mouth 2 (two) times daily. 60 tablet 0  . metFORMIN (GLUCOPHAGE) 1000 MG tablet Take 1,000 mg by mouth 2 (two) times daily with a meal.    . metoprolol succinate (TOPROL-XL) 25 MG 24 hr tablet Take 1 tablet (25 mg total) by mouth daily. 90 tablet 3  . omeprazole (PRILOSEC) 20 MG capsule Take 20 mg by mouth every morning.    [name] oxyCODONE (OXY IR/ROXICODONE) 5 MG immediate release tablet Take 5 mg by mouth every 8 (eight) hours as needed.    . potassium chloride SA (KLOR-CON) 20 MEQ tablet Take 2 tablets (40 mEq total) by mouth daily. 180 tablet 3  . prazosin (MINIPRESS) 2 MG capsule Take 4 mg by mouth at bedtime.    . pregabalin (LYRICA) 100 MG capsule Take 100 mg by mouth in the morning, at noon, and at bedtime.     . silver sulfADIAZINE (SILVADENE) 1 % cream Apply 1 application topically See admin instructions. Apply as directed once a day as directed to bilateral legs    . spironolactone (ALDACTONE) 25 MG tablet Take 1 tablet (25 mg total) by mouth daily. 10 tablet 0  . temazepam (RESTORIL) 15 MG capsule Take 15 mg by mouth at bedtime.     . torsemide (DEMADEX) 20 MG tablet Take 2 tablets (40 mg total) in the morning and 1 tablet (20 mg total) in the evening. 270 tablet 3  . traZODone (DESYREL) 100 MG tablet Take 200 mg by mouth at bedtime.    [name] venlafaxine XR (EFFEXOR-XR) 75 MG 24 hr capsule Take 225 mg by mouth daily with breakfast.    . Vitamin D, Ergocalciferol, (DRISDOL) 1.25 MG (50000 UNIT) CAPS capsule Take 50,000 Units by mouth every 7 (seven) days.     No current facility-administered medications for this visit.    Allergies:   Vancomycin, Niacin, Niacin and related, Tubersol [tuberculin], and Doxycycline   Social History:  The patient  reports that he quit smoking about 4 years ago. He has never used smokeless tobacco. He reports previous alcohol use. He reports that he does not use drugs.   Family History:  The  patient's family history includes CAD in his  maternal grandfather; Diabetes in an other family member.  ROS:  Please see the history of present illness.  All other systems are reviewed and otherwise negative.   PHYSICAL EXAM:  VS:  There were no vitals taken for this visit. BMI: There is no height or weight on file to calculate BMI. Well nourished, well developed, in no acute distress  HEENT: normocephalic, atraumatic  Neck:  JVD is difficult to assess seated/obses neck, carotid bruits or masses Cardiac: RRR; no significant murmurs, no rubs, or gallops Lungs: CTA b/l no wheezing, rhonchi or rales  Abd: soft, nontender, obese MS: no deformity or atrophy Ext: brawny type and 1++ pittig edema, some scabs b/l, generalized LE erythema mid-shin and below, b/l, not warm, he and his wife report his this as markedly improved  Skin: warm and dry, no rash Neuro:  No gross deficits appreciated Psych: euthymic mood, full affect   EKG:  Done today and reviewed by myself SR 100bpm, qst degree AVblock , some baseline artifact  11/26/20; stress myoview  Nuclear stress EF: 55%.  The left ventricular ejection fraction is normal (55-65%).  There was no ST segment deviation noted during stress.  The study is normal.  This is a low risk study.  10/17/2020: TTE IMPRESSIONS  1. Left ventricular ejection fraction, by estimation, is 60 to 65%. The  left ventricle has normal function. The left ventricle has no regional  wall motion abnormalities. Left ventricular diastolic function could not  be evaluated.  2. Right ventricular systolic function is normal. The right ventricular  size is normal. Tricuspid regurgitation signal is inadequate for assessing  PA pressure.  3. The mitral valve is grossly normal. No evidence of mitral valve  regurgitation. No evidence of mitral stenosis.  4. The aortic valve is calcified. Aortic valve regurgitation is not  visualized. Moderate aortic valve  stenosis. Aortic valve area, by VTI  measures 1.38 cm. Aortic valve mean gradient measures 19.2 mmHg. Aortic  valve Vmax measures 3.05 m/s.   Comparison(s): Changes from prior study are noted. AFib is now present.  Aortic stenosis is now moderate.    [place] 02/28/19  Max 200 bpm 09:28pm, 07/28 Min 66 bpm 08:09am, 07/28 Avg 92 bpm Multiple episodes of SVT Fastest episode at 200 BPM for 14 beats Longest episode at 15 minutes with average HR of 137 bpm Symptoms of chest pain associated with sinus rhythm Artifact makes interpretation difficult    TTE 03/08/19 1. The left ventricle has normal systolic function, with an ejection fraction of 55-60%. The cavity size was normal. There is moderate concentric left ventricular hypertrophy. Left ventricular diastolic Doppler parameters are consistent with  pseudonormalization. No evidence of left ventricular regional wall motion abnormalities. 2. The right ventricle has normal systolic function. The cavity was normal. There is no increase in right ventricular wall thickness. 3. Left atrial size was mildly dilated. 4. Right atrial size was mildly dilated. 5. Mild thickening of the mitral valve leaflet. Mild calcification of the mitral valve leaflet. There is moderate to severe mitral annular calcification present. 6. The aortic valve is tricuspid. Moderate thickening of the aortic valve. Moderate calcification of the aortic valve. Mild stenosis of the aortic valve. 7. The aorta is normal in size and structure. 8. The aortic root is normal in size and structure.   01/17/2018: LHC  There is hyperdynamic left ventricular systolic function.  LV end diastolic pressure is normal.  The left ventricular ejection fraction is greater than 65% by visual estimate.  There is no mitral valve regurgitation.  There is no aortic valve stenosis.  Prox LAD to Mid LAD lesion is 40% stenosed.  Prox Cx to Mid Cx lesion is 20% stenosed.  Ost 2nd Mrg  lesion is 20% stenosed.   Angiographically minimal coronary disease with ectatic RCA and circumflex. Normal LVEDP and normal LV function/hyperdynamic. Plan: Return to nursing unit for post catheterization care & TR band removal. Would be okay for discharge from cardiac standpoint after bedrest post TR band removal.     Recent Labs: 04/01/2020: TSH 1.690 10/21/2020: Magnesium 2.1 11/13/2020: ALT 24; B Natriuretic Peptide 40.7; BUN 20; Creatinine, Ser 1.15; Hemoglobin 12.3; Platelets 235; Potassium 3.9; Sodium 138  10/17/2020: Cholesterol 116; HDL 33; LDL Cholesterol 58; Total CHOL/HDL Ratio 3.5; Triglycerides 127; VLDL 25   CrCl cannot be calculated (Patient's most recent lab result is older than the maximum 21 days allowed.).   Wt Readings from Last 3 Encounters:  11/24/20 (!) 400 lb (181.4 kg)  11/19/20 (!) 402 lb (182.3 kg)  11/13/20 (!) 408 lb (185.1 kg)     Other studies reviewed: Additional studies/records reviewed today include: summarized above  ASSESSMENT AND PLAN:  1. SVT 2. New Paroxysmal AFib     CHA2DS2Vasc is 3 (HTN, DM, CHF)      1% burden by preliminary report of his monitor I discussed AFib with the patient, and rational for blood thinner medicines as well as risks of [place] I have discussed with out pharmacist, Pradaxa does not have data for his weight Eliquis and Xarelto both have data for weights >100-300KG that are similar to warfarin's efficacy for stroke reduction in AFib  Discussed I would like Dr. Curt Bears and Dr, Radford Pax to look at his monitor and recommendations they have regarding anticoagulation at this juncture or not and will send my note to them.  He does have a Kardia device and he feels like he can tell when his HR is up/rhythm is off   ADDEND: 12/11/20 I have reached out to Drs. Curt Bears and Radford Pax, all are in agreement he has new AFlutter/fib, and should start anticoagulation.  We will start Eliquis $RemoveBefor'5mg'XNsRGvdQjbcl$  BID stop ASA  3. HTN     No changes  today  4. Chronic CHF (diastolic)     ER last month w/BNP 40, CXR w/atalectasis     LE edema, erythema treated with antibiotics     Body habitus makes volume assessment difficult     The pt and wife report his LE looking the best they have in a while     Home weights are stable   Disposition: f/u with Dr. Radford Pax as scheduled, sooner if needed   Current medicines are reviewed at length with the patient today.  The patient did not have any concerns regarding medicines.  Venetia Night, PA-C 12/09/2020 5:17 AM     [facility] [place] [address] [phone] (office)  [phone] (fax)

## 2020-12-09 NOTE — Patient Instructions (Addendum)
Medication Instructions:   Your physician recommends that you continue on your current medications as directed. Please refer to the Current Medication list given to you today.   *If you need a refill on your cardiac medications before your next appointment, please call your pharmacy*   Lab Work: [facility]   If you have labs (blood work) drawn today and your tests are completely normal, you will receive your results only by: [name] MyChart Message (if you have MyChart) OR . A paper copy in the mail If you have any lab test that is abnormal or we need to change your treatment, we will call you to review the results.   Testing/Procedures: NONE ORDERED  TODAY   Follow-Up: At [facility], you and your health needs are our priority.  As part of our continuing mission to provide you with exceptional heart care, we have created designated Provider Care Teams.  These Care Teams include your primary Cardiologist (physician) and Advanced Practice Providers (APPs -  Physician Assistants and Nurse Practitioners) who all work together to provide you with the care you need, when you need it.  We recommend signing up for the patient portal called "MyChart".  Sign up information is provided on this After Visit Summary.  MyChart is used to connect with patients for Virtual Visits (Telemedicine).  Patients are able to view lab/test results, encounter notes, upcoming appointments, etc.  Non-urgent messages can be sent to your provider as well.   To learn more about what you can do with MyChart, go to [email or web address].    Your next appointment:   6 Months Follow Up   The format for your next appointment:   In Person  Provider:   Dr Curt Bears  Other Instructions

## 2020-12-10 ENCOUNTER — Ambulatory Visit (INDEPENDENT_AMBULATORY_CARE_PROVIDER_SITE_OTHER): Payer: Medicare Other | Admitting: Adult Health

## 2020-12-10 ENCOUNTER — Encounter: Payer: Self-pay | Admitting: Adult Health

## 2020-12-10 ENCOUNTER — Encounter (HOSPITAL_COMMUNITY): Payer: Medicare Other

## 2020-12-10 VITALS — BP 130/81 | HR 107 | Ht 72.0 in | Wt >= 6400 oz

## 2020-12-10 DIAGNOSIS — F4310 Post-traumatic stress disorder, unspecified: Secondary | ICD-10-CM | POA: Diagnosis not present

## 2020-12-10 DIAGNOSIS — F411 Generalized anxiety disorder: Secondary | ICD-10-CM

## 2020-12-10 DIAGNOSIS — F410 Panic disorder [episodic paroxysmal anxiety] without agoraphobia: Secondary | ICD-10-CM | POA: Diagnosis not present

## 2020-12-10 DIAGNOSIS — F331 Major depressive disorder, recurrent, moderate: Secondary | ICD-10-CM

## 2020-12-10 DIAGNOSIS — G4700 Insomnia, unspecified: Secondary | ICD-10-CM

## 2020-12-10 MED ORDER — BUSPIRONE HCL 5 MG PO TABS: 5.0000 mg | ORAL_TABLET | Freq: Three times a day (TID) | ORAL | 1 refills | Status: DC

## 2020-12-10 MED ORDER — VENLAFAXINE HCL ER 75 MG PO CP24: 225.0000 mg | ORAL_CAPSULE | Freq: Every day | ORAL | 3 refills | Status: DC

## 2020-12-10 NOTE — Progress Notes (Signed)
[facility] MD/PA/NP Initial Note  12/10/2020 12:03 PM Lawrence [name]  MRN:  [id number]  Chief Complaint:   HPI:  Previously followed by [facility] - difficulties getting an appointment.  Describes mood today as "ok". Pleasant. Reports tearfulness sometimes - more at night. Mood symptoms - reports depression, anxiety, and irritability. More anxious overall - more often than not. Reports panic attacks. Would like to try something to help manage anxiety symptoms.  Stating "some days are better than others". Things seem to "bother me more and more". Doesn't like to be in crowds - increases anxiety. Uses motorized scooter to aid in mobility. Using walker at home - "trying to maintain mobility". Has been taking current medications for a several years and feel like they are helpful. Previously seen at the [place]. Stable interest and motivation. Taking medications as prescribed.  Energy levels vary - most of the time lower. Active, does not have a regular exercise routine.   Enjoys some usual interests and activities. Lives with significant other of 7 years. No family local. Has a good friend locally. Appetite fair. Weight loss - high protein, low carb diet. Sleeps well most nights. Averages 5 to 9 hours - getting up during the night. Focus and concentration stable. Completing tasks. Managing aspects of household. Retired [name]. Disabled. Denies SI or HI.  Denies AH or VH.  Previous medication trials: Ambien  Visit Diagnosis:    ICD-10-CM   1. PTSD (post-traumatic stress disorder)  F43.10 venlafaxine XR (EFFEXOR-XR) 75 MG 24 hr capsule  2. Major depressive disorder, recurrent episode, moderate (HCC)  F33.1 venlafaxine XR (EFFEXOR-XR) 75 MG 24 hr capsule  3. Generalized anxiety disorder  F41.1 venlafaxine XR (EFFEXOR-XR) 75 MG 24 hr capsule    busPIRone (BUSPAR) 5 MG tablet  4. Panic attacks  F41.0 busPIRone (BUSPAR) 5 MG tablet  5. Insomnia, unspecified type  G47.00     Past Psychiatric History:  Admitted to hospital in 2005 during a divorce.  Past Medical History:  Past Medical History:  Diagnosis Date  . Acute on chronic diastolic CHF (congestive heart failure) ([place]) 01/13/2018  . Acute respiratory failure with hypoxia ([place]) 01/13/2018  . Adenomatous colon polyp 2007  . Anasarca 01/31/2017  . Anemia   . Anxiety   . Arthritis   . Asthma   . Bilateral lower extremity edema 01/31/2017  . Bilateral lower leg cellulitis   . BPH without obstruction/lower urinary tract symptoms 02/22/2017  . Cellulitis 11/07/2017  . Cellulitis of lower extremity   . Chest pain   . Chronic cough   . Chronic diastolic CHF (congestive heart failure) ([place])   . Chronic venous stasis 03/07/2019  . COPD (chronic obstructive pulmonary disease) ([place])   . Coronary artery calcification seen on CAT scan   . Depression   . Diabetic neuropathy ([place]) 09/11/2019  . Hematuria 02/22/2017  . History of anemia 12/26/2018  . History of cardiac cath    a.  01/2018 with angiographically minimal coronary disease with  up to 40% prox LAD and 20% mid LCx and OM2 with normal LVF and normal LVEDP..  . History of colon polyps 08/24/2018  . Hypertension   . Leukocytosis 08/18/2018  . Morbid obesity ([place])   . New onset type 2 diabetes mellitus ([place]) 11/08/2017  . OSA (obstructive sleep apnea)   . Pain due to onychomycosis of toenails of both feet 09/11/2019  . Peripheral neuropathy 02/22/2017  . Physical deconditioning 02/22/2017  . Primary osteoarthritis, left shoulder 03/05/2017  . PTSD (post-traumatic  stress disorder)   . Pure hypercholesterolemia   . QT prolongation 03/07/2019  . Right foot pain 01/13/2018  . Shortness of breath   . Sinus tachycardia 03/07/2019  . Sleep apnea    CPAP  . Type 2 diabetes mellitus with vascular disease ([place]) 09/11/2019    Past Surgical History:  Procedure Laterality Date  . ENDOVENOUS ABLATION SAPHENOUS VEIN W/ LASER Right 08/20/2020   endovenous laser ablation right greater saphenous vein by Gae Gallop MD   . [place]     left knee replacement x 2  . KNEE ARTHROSCOPY Bilateral   . LEFT HEART CATH AND CORONARY ANGIOGRAPHY N/A 01/17/2018   Procedure: LEFT HEART CATH AND CORONARY ANGIOGRAPHY;  Surgeon: Leonie Man, MD;  Location: [facility];  Service: Cardiovascular;  Laterality: N/A;  . UMBILICAL HERNIA REPAIR      Family Psychiatric History: Family history of mental illness - father committed suicide.  Family History:  Family History  Problem Relation Age of Onset  . CAD Maternal Grandfather   . Diabetes Other   . Diabetes Mellitus II Neg Hx   . Colon cancer Neg Hx   . Esophageal cancer Neg Hx   . Inflammatory bowel disease Neg Hx   . Liver disease Neg Hx   . Pancreatic cancer Neg Hx   . Rectal cancer Neg Hx   . Stomach cancer Neg Hx     Social History:  Social History   Socioeconomic History  . Marital status: Single    Spouse name: Not on file  . Number of children: 1  . Years of education: Not on file  . Highest education level: Not on file  Occupational History  . Occupation: retired  Tobacco Use  . Smoking status: Former Smoker    Quit date: 03/07/2016    Years since quitting: 4.7  . Smokeless tobacco: Never Used  Substance and Sexual Activity  . Alcohol use: Not Currently    Comment: rare  . Drug use: No  . Sexual activity: Not on file  Other Topics Concern  . Not on file  Social History Narrative  . Not on file   Social Determinants of Health   Financial Resource Strain: Not on file  Food Insecurity: Not on file  Transportation Needs: Not on file  Physical Activity: Not on file  Stress: Not on file  Social Connections: Not on file    Allergies:  Allergies  Allergen Reactions  . Vancomycin Other (See Comments)    "Red Man Syndrome" 02/02/17: possible cause for rash under both arms  . Niacin Other (See Comments)  . Niacin And Related Other (See Comments)    Red man syndrome  . Tubersol [Tuberculin] Other (See  Comments)    Reaction unknown  . Doxycycline Rash and Other (See Comments)    Metabolic Disorder Labs: Lab Results  Component Value Date   HGBA1C 8.6 (A) 11/19/2020   MPG 217.34 10/17/2020   MPG 191.51 12/05/2019   No results found for: PROLACTIN Lab Results  Component Value Date   CHOL 116 10/17/2020   TRIG 127 10/17/2020   HDL 33 (L) 10/17/2020   CHOLHDL 3.5 10/17/2020   VLDL 25 10/17/2020   LDLCALC 58 10/17/2020   LDLCALC 78 08/18/2018   Lab Results  Component Value Date   TSH 1.690 04/01/2020   TSH 1.648 03/07/2019    Therapeutic Level Labs: No results found for: LITHIUM No results found for: VALPROATE No components found for:  CBMZ  Current Medications: Current Outpatient Medications  Medication Sig Dispense Refill  . busPIRone (BUSPAR) 5 MG tablet Take 1 tablet (5 mg total) by mouth 3 (three) times daily. 270 tablet 1  . acetaminophen (TYLENOL) 650 MG CR tablet Take 1,300 mg by mouth every 8 (eight) hours as needed for pain.    [name] albuterol (VENTOLIN HFA) 108 (90 Base) MCG/ACT inhaler Inhale into the lungs.    [name] allopurinol (ZYLOPRIM) 100 MG tablet Take 100 mg by mouth 2 (two) times daily.    . ARIPiprazole (ABILIFY) 5 MG tablet Take 5 mg by mouth at bedtime.     [name] aspirin EC 81 MG EC tablet Take 1 tablet (81 mg total) by mouth daily. Swallow whole. 30 tablet 0  . atorvastatin (LIPITOR) 40 MG tablet TAKE ONE TABLET BY MOUTH DAILY AT 6PM 90 tablet 2  . blood glucose meter kit and supplies Dispense based on patient and insurance preference. Use up to four times daily as directed. (FOR ICD-10 E10.9, E11.9). 1 each 0  . cetirizine (ZYRTEC) 10 MG tablet Take 10 mg by mouth at bedtime.     . Dulaglutide (TRULICITY) 4.5 MG/0.5ML SOPN Inject 4.5 mg as directed once a week. 4.5 mL 3  . ferrous sulfate 324 (65 Fe) MG TBEC Take 324 mg by mouth daily with breakfast.     . fluticasone (FLONASE) 50 MCG/ACT nasal spray Place 2 sprays into both nostrils daily.     [name] LANTUS  SOLOSTAR 100 UNIT/ML Solostar Pen Inject 12 Units into the skin every morning. 15 mL 3  . levETIRAcetam (KEPPRA) 1000 MG tablet Take 1 tablet (1,000 mg total) by mouth 2 (two) times daily. 60 tablet 0  . metFORMIN (GLUCOPHAGE) 1000 MG tablet Take 1,000 mg by mouth 2 (two) times daily with a meal.    . metoprolol succinate (TOPROL-XL) 25 MG 24 hr tablet Take 1 tablet (25 mg total) by mouth daily. 90 tablet 3  . omeprazole (PRILOSEC) 20 MG capsule Take 20 mg by mouth every morning.    [name] oxyCODONE (OXY IR/ROXICODONE) 5 MG immediate release tablet Take 5 mg by mouth every 8 (eight) hours as needed.    . potassium chloride SA (KLOR-CON) 20 MEQ tablet Take 2 tablets (40 mEq total) by mouth daily. 180 tablet 3  . prazosin (MINIPRESS) 2 MG capsule Take 4 mg by mouth at bedtime.    . pregabalin (LYRICA) 100 MG capsule Take 100 mg by mouth in the morning, at noon, and at bedtime.     . silver sulfADIAZINE (SILVADENE) 1 % cream Apply 1 application topically See admin instructions. Apply as directed once a day as directed to bilateral legs    . spironolactone (ALDACTONE) 25 MG tablet Take 1 tablet (25 mg total) by mouth daily. 10 tablet 0  . temazepam (RESTORIL) 15 MG capsule Take 15 mg by mouth at bedtime.     . torsemide (DEMADEX) 20 MG tablet Take 2 tablets (40 mg total) in the morning and 1 tablet (20 mg total) in the evening. 270 tablet 3  . traZODone (DESYREL) 100 MG tablet Take 200 mg by mouth at bedtime.    [name] venlafaxine XR (EFFEXOR-XR) 75 MG 24 hr capsule Take 3 capsules (225 mg total) by mouth daily with breakfast. 270 capsule 3  . Vitamin D, Ergocalciferol, (DRISDOL) 1.25 MG (50000 UNIT) CAPS capsule Take 50,000 Units by mouth every 7 (seven) days.     No current facility-administered medications for this visit.  Medication Side Effects: none  Orders placed this visit:  No orders of the defined types were placed in this encounter.   Psychiatric Specialty Exam:  Review of Systems   Musculoskeletal: Negative for gait problem.  Neurological: Negative for tremors.  Psychiatric/Behavioral:       Please refer to HPI    Blood pressure 130/81, pulse (!) 107, height 6' (1.829 m), weight (!) 400 lb (181.4 kg).Body mass index is 54.25 kg/m.  General Appearance: Casual and Neat  Eye Contact:  Good  Speech:  Clear and Coherent and Normal Rate  Volume:  Normal  Mood:  Anxious, Depressed and Irritable  Affect:  Appropriate and Congruent  Thought Process:  Coherent  Orientation:  Full (Time, Place, and Person)  Thought Content: Logical   Suicidal Thoughts:  No  Homicidal Thoughts:  No  Memory:  WNL  Judgement:  Good  Insight:  Good  Psychomotor Activity:  Normal  Concentration:  Concentration: Good  Recall:  Good  Fund of Knowledge: Good  Language: Negative  Assets:  Communication Skills Desire for Improvement Financial Resources/Insurance Housing Intimacy Leisure Time Physical Health Resilience Social Support Talents/Skills Transportation Vocational/Educational  ADL's:  Intact  Cognition: WNL  Prognosis:  Good   Screenings:  Flowsheet Row ED from 11/13/2020 in [place] ED to Hosp-Admission (Discharged) from 10/16/2020 in [facility]  C-SSRS RISK CATEGORY No Risk No Risk      Receiving Psychotherapy: No   Treatment Plan/Recommendations:   Plan:  PDMP reviewed  1. Prazosin $RemoveBef'2mg'CZxDGooJvM$  - 2 tabs at hs 2. Temazepam $RemoveBefo'15mg'iPWAuzFyOHt$  at bedtime 3. Abilify $RemoveBe'5mg'xLipZRmtt$  daily 4. Trazadone $RemoveBefo'100mg'vxYQwUZtQkG$  - 2 at bedtime 5. Effexor XR $RemoveBe'75mg'FcPAZDMvB$  - 3 every morning 6. Buspar $RemoveB'5mg'RzaXCkGj$  TID   [place] - pain management  RTC 2 months  Patient advised to contact office with any questions, adverse effects, or acute worsening in signs and symptoms.  Discussed potential metabolic side effects associated with atypical antipsychotics, as well as potential risk for movement side effects. Advised pt to contact office if movement side  effects occur.   Aloha Gell, NP

## 2020-12-11 ENCOUNTER — Telehealth: Payer: Self-pay | Admitting: *Deleted

## 2020-12-11 ENCOUNTER — Ambulatory Visit (HOSPITAL_COMMUNITY): Payer: Medicare Other

## 2020-12-11 DIAGNOSIS — I130 Hypertensive heart and chronic kidney disease with heart failure and stage 1 through stage 4 chronic kidney disease, or unspecified chronic kidney disease: Secondary | ICD-10-CM | POA: Diagnosis not present

## 2020-12-11 DIAGNOSIS — E876 Hypokalemia: Secondary | ICD-10-CM | POA: Diagnosis not present

## 2020-12-11 DIAGNOSIS — I2510 Atherosclerotic heart disease of native coronary artery without angina pectoris: Secondary | ICD-10-CM | POA: Diagnosis not present

## 2020-12-11 DIAGNOSIS — R569 Unspecified convulsions: Secondary | ICD-10-CM | POA: Diagnosis not present

## 2020-12-11 DIAGNOSIS — G4733 Obstructive sleep apnea (adult) (pediatric): Secondary | ICD-10-CM | POA: Diagnosis not present

## 2020-12-11 DIAGNOSIS — J9601 Acute respiratory failure with hypoxia: Secondary | ICD-10-CM | POA: Diagnosis not present

## 2020-12-11 DIAGNOSIS — G4700 Insomnia, unspecified: Secondary | ICD-10-CM | POA: Diagnosis not present

## 2020-12-11 DIAGNOSIS — E785 Hyperlipidemia, unspecified: Secondary | ICD-10-CM | POA: Diagnosis not present

## 2020-12-11 DIAGNOSIS — J449 Chronic obstructive pulmonary disease, unspecified: Secondary | ICD-10-CM | POA: Diagnosis not present

## 2020-12-11 DIAGNOSIS — I471 Supraventricular tachycardia: Secondary | ICD-10-CM | POA: Diagnosis not present

## 2020-12-11 DIAGNOSIS — I5033 Acute on chronic diastolic (congestive) heart failure: Secondary | ICD-10-CM | POA: Diagnosis not present

## 2020-12-11 DIAGNOSIS — N183 Chronic kidney disease, stage 3 unspecified: Secondary | ICD-10-CM | POA: Diagnosis not present

## 2020-12-11 DIAGNOSIS — E1140 Type 2 diabetes mellitus with diabetic neuropathy, unspecified: Secondary | ICD-10-CM | POA: Diagnosis not present

## 2020-12-11 DIAGNOSIS — E1165 Type 2 diabetes mellitus with hyperglycemia: Secondary | ICD-10-CM | POA: Diagnosis not present

## 2020-12-11 DIAGNOSIS — E871 Hypo-osmolality and hyponatremia: Secondary | ICD-10-CM | POA: Diagnosis not present

## 2020-12-11 DIAGNOSIS — I872 Venous insufficiency (chronic) (peripheral): Secondary | ICD-10-CM | POA: Diagnosis not present

## 2020-12-11 DIAGNOSIS — E1122 Type 2 diabetes mellitus with diabetic chronic kidney disease: Secondary | ICD-10-CM | POA: Diagnosis not present

## 2020-12-11 DIAGNOSIS — E1169 Type 2 diabetes mellitus with other specified complication: Secondary | ICD-10-CM | POA: Diagnosis not present

## 2020-12-11 DIAGNOSIS — M109 Gout, unspecified: Secondary | ICD-10-CM | POA: Diagnosis not present

## 2020-12-11 DIAGNOSIS — E1151 Type 2 diabetes mellitus with diabetic peripheral angiopathy without gangrene: Secondary | ICD-10-CM | POA: Diagnosis not present

## 2020-12-11 MED ORDER — APIXABAN 5 MG PO TABS: 5.0000 mg | ORAL_TABLET | Freq: Two times a day (BID) | ORAL | 1 refills | Status: DC

## 2020-12-11 NOTE — Telephone Encounter (Signed)
Spoke with patient aware and verbalized understanding of to start Eliquis 5mg  BID and stop ASA. Patient aware of samples up front to be picked up  and 30 day free coupon card if available. Patient state he will send his significant other to pick up before 5 pm today

## 2020-12-11 NOTE — Telephone Encounter (Signed)
-----   Message from [name], [place] sent at 12/11/2020  9:43 AM EDT ----- Please let the patient know I havs reviewed his case, and monitor with Drs. Camnitz and Turner, and wwould like to start anticoagulation as we discussed at his visit.  Please start Eliquis 5mg  BID and stop ASA.  Lawrence [name]

## 2020-12-15 ENCOUNTER — Telehealth: Payer: Self-pay | Admitting: Adult Health

## 2020-12-15 DIAGNOSIS — E1122 Type 2 diabetes mellitus with diabetic chronic kidney disease: Secondary | ICD-10-CM | POA: Diagnosis not present

## 2020-12-15 DIAGNOSIS — E785 Hyperlipidemia, unspecified: Secondary | ICD-10-CM | POA: Diagnosis not present

## 2020-12-15 DIAGNOSIS — E876 Hypokalemia: Secondary | ICD-10-CM | POA: Diagnosis not present

## 2020-12-15 DIAGNOSIS — J449 Chronic obstructive pulmonary disease, unspecified: Secondary | ICD-10-CM | POA: Diagnosis not present

## 2020-12-15 DIAGNOSIS — E871 Hypo-osmolality and hyponatremia: Secondary | ICD-10-CM | POA: Diagnosis not present

## 2020-12-15 DIAGNOSIS — G4733 Obstructive sleep apnea (adult) (pediatric): Secondary | ICD-10-CM | POA: Diagnosis not present

## 2020-12-15 DIAGNOSIS — M109 Gout, unspecified: Secondary | ICD-10-CM | POA: Diagnosis not present

## 2020-12-15 DIAGNOSIS — I471 Supraventricular tachycardia: Secondary | ICD-10-CM | POA: Diagnosis not present

## 2020-12-15 DIAGNOSIS — E1165 Type 2 diabetes mellitus with hyperglycemia: Secondary | ICD-10-CM | POA: Diagnosis not present

## 2020-12-15 DIAGNOSIS — J9601 Acute respiratory failure with hypoxia: Secondary | ICD-10-CM | POA: Diagnosis not present

## 2020-12-15 DIAGNOSIS — I5033 Acute on chronic diastolic (congestive) heart failure: Secondary | ICD-10-CM | POA: Diagnosis not present

## 2020-12-15 DIAGNOSIS — I872 Venous insufficiency (chronic) (peripheral): Secondary | ICD-10-CM | POA: Diagnosis not present

## 2020-12-15 DIAGNOSIS — E1151 Type 2 diabetes mellitus with diabetic peripheral angiopathy without gangrene: Secondary | ICD-10-CM | POA: Diagnosis not present

## 2020-12-15 DIAGNOSIS — R569 Unspecified convulsions: Secondary | ICD-10-CM | POA: Diagnosis not present

## 2020-12-15 DIAGNOSIS — E1140 Type 2 diabetes mellitus with diabetic neuropathy, unspecified: Secondary | ICD-10-CM | POA: Diagnosis not present

## 2020-12-15 DIAGNOSIS — N183 Chronic kidney disease, stage 3 unspecified: Secondary | ICD-10-CM | POA: Diagnosis not present

## 2020-12-15 DIAGNOSIS — G4700 Insomnia, unspecified: Secondary | ICD-10-CM | POA: Diagnosis not present

## 2020-12-15 DIAGNOSIS — E1169 Type 2 diabetes mellitus with other specified complication: Secondary | ICD-10-CM | POA: Diagnosis not present

## 2020-12-15 DIAGNOSIS — I2510 Atherosclerotic heart disease of native coronary artery without angina pectoris: Secondary | ICD-10-CM | POA: Diagnosis not present

## 2020-12-15 DIAGNOSIS — I130 Hypertensive heart and chronic kidney disease with heart failure and stage 1 through stage 4 chronic kidney disease, or unspecified chronic kidney disease: Secondary | ICD-10-CM | POA: Diagnosis not present

## 2020-12-15 NOTE — Telephone Encounter (Signed)
Was he sent the right amount,or do you think he is just taking too much?

## 2020-12-15 NOTE — Telephone Encounter (Signed)
Can you follow up?

## 2020-12-15 NOTE — Telephone Encounter (Signed)
Ray called because the [place] won't fill his temazepam.  They say it is not due until 12/27/20 but he said it should be due sooner.  He will run out before 12/27/20.  Please call him to discuss what can be done to get the medication refilled.  Appt 12/31/20

## 2020-12-15 NOTE — Telephone Encounter (Signed)
Noted I will check into this.  

## 2020-12-16 DIAGNOSIS — J449 Chronic obstructive pulmonary disease, unspecified: Secondary | ICD-10-CM | POA: Diagnosis not present

## 2020-12-16 DIAGNOSIS — I480 Paroxysmal atrial fibrillation: Secondary | ICD-10-CM | POA: Diagnosis not present

## 2020-12-16 DIAGNOSIS — E1165 Type 2 diabetes mellitus with hyperglycemia: Secondary | ICD-10-CM | POA: Diagnosis not present

## 2020-12-16 DIAGNOSIS — E785 Hyperlipidemia, unspecified: Secondary | ICD-10-CM | POA: Diagnosis not present

## 2020-12-16 DIAGNOSIS — I10 Essential (primary) hypertension: Secondary | ICD-10-CM | POA: Diagnosis not present

## 2020-12-16 DIAGNOSIS — I5032 Chronic diastolic (congestive) heart failure: Secondary | ICD-10-CM | POA: Diagnosis not present

## 2020-12-16 DIAGNOSIS — G4700 Insomnia, unspecified: Secondary | ICD-10-CM | POA: Diagnosis not present

## 2020-12-16 DIAGNOSIS — I2510 Atherosclerotic heart disease of native coronary artery without angina pectoris: Secondary | ICD-10-CM | POA: Diagnosis not present

## 2020-12-16 DIAGNOSIS — E1169 Type 2 diabetes mellitus with other specified complication: Secondary | ICD-10-CM | POA: Diagnosis not present

## 2020-12-17 ENCOUNTER — Ambulatory Visit (HOSPITAL_COMMUNITY): Payer: Medicare Other

## 2020-12-17 NOTE — Telephone Encounter (Signed)
Noted  

## 2020-12-17 NOTE — Telephone Encounter (Signed)
Yes

## 2020-12-17 NOTE — Telephone Encounter (Signed)
Just an update, a bit of confusion on what is going on with his Rx of Temazepam. [facility] Pt last refill 4/19. Unclear why they won't but pt mentions he's switching Rx's that he can to [facility]. He uses [facility] locally which I have spoke to them about his medication.  t Pt will be out on 5/19, which [facility].    Is it okay to cancel med at [facility] and start with [facility]?

## 2020-12-18 ENCOUNTER — Other Ambulatory Visit: Payer: Self-pay

## 2020-12-18 ENCOUNTER — Ambulatory Visit (HOSPITAL_COMMUNITY): Payer: Medicare Other

## 2020-12-18 DIAGNOSIS — G4733 Obstructive sleep apnea (adult) (pediatric): Secondary | ICD-10-CM | POA: Diagnosis not present

## 2020-12-18 DIAGNOSIS — E1169 Type 2 diabetes mellitus with other specified complication: Secondary | ICD-10-CM | POA: Diagnosis not present

## 2020-12-18 DIAGNOSIS — I5033 Acute on chronic diastolic (congestive) heart failure: Secondary | ICD-10-CM | POA: Diagnosis not present

## 2020-12-18 DIAGNOSIS — G4700 Insomnia, unspecified: Secondary | ICD-10-CM | POA: Diagnosis not present

## 2020-12-18 DIAGNOSIS — I130 Hypertensive heart and chronic kidney disease with heart failure and stage 1 through stage 4 chronic kidney disease, or unspecified chronic kidney disease: Secondary | ICD-10-CM | POA: Diagnosis not present

## 2020-12-18 DIAGNOSIS — E1165 Type 2 diabetes mellitus with hyperglycemia: Secondary | ICD-10-CM | POA: Diagnosis not present

## 2020-12-18 DIAGNOSIS — E871 Hypo-osmolality and hyponatremia: Secondary | ICD-10-CM | POA: Diagnosis not present

## 2020-12-18 DIAGNOSIS — R569 Unspecified convulsions: Secondary | ICD-10-CM | POA: Diagnosis not present

## 2020-12-18 DIAGNOSIS — E876 Hypokalemia: Secondary | ICD-10-CM | POA: Diagnosis not present

## 2020-12-18 DIAGNOSIS — E1151 Type 2 diabetes mellitus with diabetic peripheral angiopathy without gangrene: Secondary | ICD-10-CM | POA: Diagnosis not present

## 2020-12-18 DIAGNOSIS — I872 Venous insufficiency (chronic) (peripheral): Secondary | ICD-10-CM | POA: Diagnosis not present

## 2020-12-18 DIAGNOSIS — E785 Hyperlipidemia, unspecified: Secondary | ICD-10-CM | POA: Diagnosis not present

## 2020-12-18 DIAGNOSIS — I2510 Atherosclerotic heart disease of native coronary artery without angina pectoris: Secondary | ICD-10-CM | POA: Diagnosis not present

## 2020-12-18 DIAGNOSIS — J9601 Acute respiratory failure with hypoxia: Secondary | ICD-10-CM | POA: Diagnosis not present

## 2020-12-18 DIAGNOSIS — N183 Chronic kidney disease, stage 3 unspecified: Secondary | ICD-10-CM | POA: Diagnosis not present

## 2020-12-18 DIAGNOSIS — J449 Chronic obstructive pulmonary disease, unspecified: Secondary | ICD-10-CM | POA: Diagnosis not present

## 2020-12-18 DIAGNOSIS — I471 Supraventricular tachycardia: Secondary | ICD-10-CM | POA: Diagnosis not present

## 2020-12-18 DIAGNOSIS — M109 Gout, unspecified: Secondary | ICD-10-CM | POA: Diagnosis not present

## 2020-12-18 DIAGNOSIS — E1122 Type 2 diabetes mellitus with diabetic chronic kidney disease: Secondary | ICD-10-CM | POA: Diagnosis not present

## 2020-12-18 DIAGNOSIS — E1140 Type 2 diabetes mellitus with diabetic neuropathy, unspecified: Secondary | ICD-10-CM | POA: Diagnosis not present

## 2020-12-18 MED ORDER — TEMAZEPAM 15 MG PO CAPS: 15.0000 mg | ORAL_CAPSULE | Freq: Every day | ORAL | 1 refills | Status: DC

## 2020-12-18 NOTE — Telephone Encounter (Signed)
Rx pended for Barnett Applebaum to send   [facility] to [facility] and change to [facility] Last refill 4/19

## 2020-12-20 DIAGNOSIS — I5032 Chronic diastolic (congestive) heart failure: Secondary | ICD-10-CM

## 2020-12-22 ENCOUNTER — Other Ambulatory Visit: Payer: Self-pay

## 2020-12-22 ENCOUNTER — Telehealth: Payer: Self-pay | Admitting: Adult Health

## 2020-12-22 ENCOUNTER — Other Ambulatory Visit: Payer: Self-pay | Admitting: Adult Health

## 2020-12-22 DIAGNOSIS — I471 Supraventricular tachycardia: Secondary | ICD-10-CM | POA: Diagnosis not present

## 2020-12-22 DIAGNOSIS — E1165 Type 2 diabetes mellitus with hyperglycemia: Secondary | ICD-10-CM | POA: Diagnosis not present

## 2020-12-22 DIAGNOSIS — I872 Venous insufficiency (chronic) (peripheral): Secondary | ICD-10-CM | POA: Diagnosis not present

## 2020-12-22 DIAGNOSIS — J9601 Acute respiratory failure with hypoxia: Secondary | ICD-10-CM | POA: Diagnosis not present

## 2020-12-22 DIAGNOSIS — G4700 Insomnia, unspecified: Secondary | ICD-10-CM

## 2020-12-22 DIAGNOSIS — I130 Hypertensive heart and chronic kidney disease with heart failure and stage 1 through stage 4 chronic kidney disease, or unspecified chronic kidney disease: Secondary | ICD-10-CM | POA: Diagnosis not present

## 2020-12-22 DIAGNOSIS — E1140 Type 2 diabetes mellitus with diabetic neuropathy, unspecified: Secondary | ICD-10-CM | POA: Diagnosis not present

## 2020-12-22 DIAGNOSIS — E871 Hypo-osmolality and hyponatremia: Secondary | ICD-10-CM | POA: Diagnosis not present

## 2020-12-22 DIAGNOSIS — E785 Hyperlipidemia, unspecified: Secondary | ICD-10-CM | POA: Diagnosis not present

## 2020-12-22 DIAGNOSIS — E1122 Type 2 diabetes mellitus with diabetic chronic kidney disease: Secondary | ICD-10-CM | POA: Diagnosis not present

## 2020-12-22 DIAGNOSIS — I2510 Atherosclerotic heart disease of native coronary artery without angina pectoris: Secondary | ICD-10-CM | POA: Diagnosis not present

## 2020-12-22 DIAGNOSIS — E1151 Type 2 diabetes mellitus with diabetic peripheral angiopathy without gangrene: Secondary | ICD-10-CM | POA: Diagnosis not present

## 2020-12-22 DIAGNOSIS — E1169 Type 2 diabetes mellitus with other specified complication: Secondary | ICD-10-CM | POA: Diagnosis not present

## 2020-12-22 DIAGNOSIS — I5033 Acute on chronic diastolic (congestive) heart failure: Secondary | ICD-10-CM | POA: Diagnosis not present

## 2020-12-22 DIAGNOSIS — J449 Chronic obstructive pulmonary disease, unspecified: Secondary | ICD-10-CM | POA: Diagnosis not present

## 2020-12-22 DIAGNOSIS — G4733 Obstructive sleep apnea (adult) (pediatric): Secondary | ICD-10-CM | POA: Diagnosis not present

## 2020-12-22 DIAGNOSIS — M109 Gout, unspecified: Secondary | ICD-10-CM | POA: Diagnosis not present

## 2020-12-22 DIAGNOSIS — R569 Unspecified convulsions: Secondary | ICD-10-CM | POA: Diagnosis not present

## 2020-12-22 DIAGNOSIS — E876 Hypokalemia: Secondary | ICD-10-CM | POA: Diagnosis not present

## 2020-12-22 DIAGNOSIS — N183 Chronic kidney disease, stage 3 unspecified: Secondary | ICD-10-CM | POA: Diagnosis not present

## 2020-12-22 MED ORDER — TEMAZEPAM 15 MG PO CAPS: 15.0000 mg | ORAL_CAPSULE | Freq: Every day | ORAL | 1 refills | Status: DC

## 2020-12-22 NOTE — Telephone Encounter (Signed)
pended

## 2020-12-22 NOTE — Telephone Encounter (Signed)
It needs to be faxed - they do not accept controlled substances via electronic filing.

## 2020-12-22 NOTE — Telephone Encounter (Signed)
Pt reports chest pain/discomfort that occurred Sunday. States he has felt this before but this one was more intense.  Rates previous experience as a 2 and this one as a 4. Pt stayed in the chest area, did not radiate.  It was fairly constant for about 20 minutes. He was SOB during occurrence and was sitting at the time. Pt states that he sent the [facility]ia mobile reading b/c he was exerpeincing the CP.  Aware I will forward to Dr. Radford Pax & Camnitz to review/advise. Aware I will follow up by the end of the week. Pt agreeable to plan.

## 2020-12-22 NOTE — Telephone Encounter (Signed)
Pt called and said that the tenazapam was never sent to the pharmacy. It looks like it went to print instead . Please re escribe to [facility]

## 2020-12-23 MED ORDER — ISOSORBIDE MONONITRATE ER 30 MG PO TB24: 30.0000 mg | ORAL_TABLET | Freq: Every day | ORAL | 3 refills | Status: DC

## 2020-12-23 NOTE — Telephone Encounter (Signed)
Spoke with the patient and he will start on Imdur 30 mg daily. I have scheduled him for a follow up visit with Dr. Radford Pax as well as an echocardiogram.

## 2020-12-25 ENCOUNTER — Ambulatory Visit: Payer: Medicare Other | Admitting: Podiatry

## 2020-12-25 DIAGNOSIS — I2510 Atherosclerotic heart disease of native coronary artery without angina pectoris: Secondary | ICD-10-CM | POA: Diagnosis not present

## 2020-12-25 DIAGNOSIS — J9601 Acute respiratory failure with hypoxia: Secondary | ICD-10-CM | POA: Diagnosis not present

## 2020-12-25 DIAGNOSIS — M109 Gout, unspecified: Secondary | ICD-10-CM | POA: Diagnosis not present

## 2020-12-25 DIAGNOSIS — E1140 Type 2 diabetes mellitus with diabetic neuropathy, unspecified: Secondary | ICD-10-CM | POA: Diagnosis not present

## 2020-12-25 DIAGNOSIS — N183 Chronic kidney disease, stage 3 unspecified: Secondary | ICD-10-CM | POA: Diagnosis not present

## 2020-12-25 DIAGNOSIS — E1151 Type 2 diabetes mellitus with diabetic peripheral angiopathy without gangrene: Secondary | ICD-10-CM | POA: Diagnosis not present

## 2020-12-25 DIAGNOSIS — E1165 Type 2 diabetes mellitus with hyperglycemia: Secondary | ICD-10-CM | POA: Diagnosis not present

## 2020-12-25 DIAGNOSIS — E785 Hyperlipidemia, unspecified: Secondary | ICD-10-CM | POA: Diagnosis not present

## 2020-12-25 DIAGNOSIS — E871 Hypo-osmolality and hyponatremia: Secondary | ICD-10-CM | POA: Diagnosis not present

## 2020-12-25 DIAGNOSIS — I5033 Acute on chronic diastolic (congestive) heart failure: Secondary | ICD-10-CM | POA: Diagnosis not present

## 2020-12-25 DIAGNOSIS — I872 Venous insufficiency (chronic) (peripheral): Secondary | ICD-10-CM | POA: Diagnosis not present

## 2020-12-25 DIAGNOSIS — E1169 Type 2 diabetes mellitus with other specified complication: Secondary | ICD-10-CM | POA: Diagnosis not present

## 2020-12-25 DIAGNOSIS — E1122 Type 2 diabetes mellitus with diabetic chronic kidney disease: Secondary | ICD-10-CM | POA: Diagnosis not present

## 2020-12-25 DIAGNOSIS — J449 Chronic obstructive pulmonary disease, unspecified: Secondary | ICD-10-CM | POA: Diagnosis not present

## 2020-12-25 DIAGNOSIS — G4733 Obstructive sleep apnea (adult) (pediatric): Secondary | ICD-10-CM | POA: Diagnosis not present

## 2020-12-25 DIAGNOSIS — E876 Hypokalemia: Secondary | ICD-10-CM | POA: Diagnosis not present

## 2020-12-25 DIAGNOSIS — R569 Unspecified convulsions: Secondary | ICD-10-CM | POA: Diagnosis not present

## 2020-12-25 DIAGNOSIS — I471 Supraventricular tachycardia: Secondary | ICD-10-CM | POA: Diagnosis not present

## 2020-12-25 DIAGNOSIS — G4700 Insomnia, unspecified: Secondary | ICD-10-CM | POA: Diagnosis not present

## 2020-12-25 DIAGNOSIS — I130 Hypertensive heart and chronic kidney disease with heart failure and stage 1 through stage 4 chronic kidney disease, or unspecified chronic kidney disease: Secondary | ICD-10-CM | POA: Diagnosis not present

## 2020-12-28 ENCOUNTER — Ambulatory Visit (INDEPENDENT_AMBULATORY_CARE_PROVIDER_SITE_OTHER): Payer: Medicare Other | Admitting: Podiatry

## 2020-12-28 ENCOUNTER — Encounter: Payer: Self-pay | Admitting: Podiatry

## 2020-12-28 ENCOUNTER — Other Ambulatory Visit: Payer: Self-pay

## 2020-12-28 DIAGNOSIS — M79674 Pain in right toe(s): Secondary | ICD-10-CM

## 2020-12-28 DIAGNOSIS — M79675 Pain in left toe(s): Secondary | ICD-10-CM

## 2020-12-28 DIAGNOSIS — B351 Tinea unguium: Secondary | ICD-10-CM | POA: Diagnosis not present

## 2020-12-28 DIAGNOSIS — E1142 Type 2 diabetes mellitus with diabetic polyneuropathy: Secondary | ICD-10-CM

## 2020-12-28 DIAGNOSIS — I878 Other specified disorders of veins: Secondary | ICD-10-CM

## 2020-12-28 NOTE — Progress Notes (Signed)
This patient returns to my office for at risk foot care.  This patient requires this care by a professional since this patient will be at risk due to having diabetic neuropathy and angiopathy and venous stasis.  . This patient presents to the office with his wife.This patient is unable to cut nails himself since the patient cannot reach his nails.These nails are painful walking and wearing shoes.  This patient presents for at risk foot care today.  General Appearance  Alert, conversant and in no acute stress.  Vascular  Dorsalis pedis and posterior tibial  pulses are not  palpable  billaterally due to severe swelling feet.  Capillary return is within normal limits  bilaterally. Temperature is within normal limits  Bilaterally. Venous stasis right leg.  Neurologic  Senn-Weinstein monofilament wire test absent right foot.  Diminished left foot.. Muscle power within normal limits bilaterally.  Nails Thick disfigured discolored nails with subungual debris  from hallux to fifth toes bilaterally.  Pincer hallux nails   B/L. No evidence of bacterial infection or drainage bilaterally.  Orthopedic  No limitations of motion  feet .  No crepitus or effusions noted.  No bony pathology or digital deformities noted.  Severe HAV  B/L.  Skin  normotropic skin with no porokeratosis noted bilaterally.  No signs of infections or ulcers noted.     Onychomycosis  Pain in right toes  Pain in left toes  Consent was obtained for treatment procedures.   Mechanical debridement of nails 1-5  bilaterally performed with a nail nipper.  Filed with dremel without incident.    Return office visit   3 months                   Told patient to return for periodic foot care and evaluation due to potential at risk complications.   Blanch Stang DPM  

## 2020-12-29 DIAGNOSIS — E1169 Type 2 diabetes mellitus with other specified complication: Secondary | ICD-10-CM | POA: Diagnosis not present

## 2020-12-29 DIAGNOSIS — J9601 Acute respiratory failure with hypoxia: Secondary | ICD-10-CM | POA: Diagnosis not present

## 2020-12-29 DIAGNOSIS — G4700 Insomnia, unspecified: Secondary | ICD-10-CM | POA: Diagnosis not present

## 2020-12-29 DIAGNOSIS — J449 Chronic obstructive pulmonary disease, unspecified: Secondary | ICD-10-CM | POA: Diagnosis not present

## 2020-12-29 DIAGNOSIS — E871 Hypo-osmolality and hyponatremia: Secondary | ICD-10-CM | POA: Diagnosis not present

## 2020-12-29 DIAGNOSIS — I5033 Acute on chronic diastolic (congestive) heart failure: Secondary | ICD-10-CM | POA: Diagnosis not present

## 2020-12-29 DIAGNOSIS — E785 Hyperlipidemia, unspecified: Secondary | ICD-10-CM | POA: Diagnosis not present

## 2020-12-29 DIAGNOSIS — N183 Chronic kidney disease, stage 3 unspecified: Secondary | ICD-10-CM | POA: Diagnosis not present

## 2020-12-29 DIAGNOSIS — E1151 Type 2 diabetes mellitus with diabetic peripheral angiopathy without gangrene: Secondary | ICD-10-CM | POA: Diagnosis not present

## 2020-12-29 DIAGNOSIS — R569 Unspecified convulsions: Secondary | ICD-10-CM | POA: Diagnosis not present

## 2020-12-29 DIAGNOSIS — E1122 Type 2 diabetes mellitus with diabetic chronic kidney disease: Secondary | ICD-10-CM | POA: Diagnosis not present

## 2020-12-29 DIAGNOSIS — I872 Venous insufficiency (chronic) (peripheral): Secondary | ICD-10-CM | POA: Diagnosis not present

## 2020-12-29 DIAGNOSIS — E1165 Type 2 diabetes mellitus with hyperglycemia: Secondary | ICD-10-CM | POA: Diagnosis not present

## 2020-12-29 DIAGNOSIS — I2510 Atherosclerotic heart disease of native coronary artery without angina pectoris: Secondary | ICD-10-CM | POA: Diagnosis not present

## 2020-12-29 DIAGNOSIS — M109 Gout, unspecified: Secondary | ICD-10-CM | POA: Diagnosis not present

## 2020-12-29 DIAGNOSIS — E876 Hypokalemia: Secondary | ICD-10-CM | POA: Diagnosis not present

## 2020-12-29 DIAGNOSIS — I471 Supraventricular tachycardia: Secondary | ICD-10-CM | POA: Diagnosis not present

## 2020-12-29 DIAGNOSIS — E1140 Type 2 diabetes mellitus with diabetic neuropathy, unspecified: Secondary | ICD-10-CM | POA: Diagnosis not present

## 2020-12-29 DIAGNOSIS — I130 Hypertensive heart and chronic kidney disease with heart failure and stage 1 through stage 4 chronic kidney disease, or unspecified chronic kidney disease: Secondary | ICD-10-CM | POA: Diagnosis not present

## 2020-12-29 DIAGNOSIS — G4733 Obstructive sleep apnea (adult) (pediatric): Secondary | ICD-10-CM | POA: Diagnosis not present

## 2020-12-31 ENCOUNTER — Ambulatory Visit (INDEPENDENT_AMBULATORY_CARE_PROVIDER_SITE_OTHER): Payer: Medicare Other | Admitting: Adult Health

## 2020-12-31 ENCOUNTER — Other Ambulatory Visit: Payer: Self-pay

## 2020-12-31 ENCOUNTER — Encounter: Payer: Self-pay | Admitting: Adult Health

## 2020-12-31 ENCOUNTER — Ambulatory Visit: Payer: Medicare Other | Admitting: Neurology

## 2020-12-31 DIAGNOSIS — F331 Major depressive disorder, recurrent, moderate: Secondary | ICD-10-CM | POA: Diagnosis not present

## 2020-12-31 DIAGNOSIS — F411 Generalized anxiety disorder: Secondary | ICD-10-CM

## 2020-12-31 DIAGNOSIS — F410 Panic disorder [episodic paroxysmal anxiety] without agoraphobia: Secondary | ICD-10-CM

## 2020-12-31 DIAGNOSIS — F4310 Post-traumatic stress disorder, unspecified: Secondary | ICD-10-CM

## 2020-12-31 DIAGNOSIS — G4700 Insomnia, unspecified: Secondary | ICD-10-CM

## 2020-12-31 MED ORDER — TRAZODONE HCL 100 MG PO TABS: 200.0000 mg | ORAL_TABLET | Freq: Every day | ORAL | 3 refills | Status: DC

## 2020-12-31 NOTE — Progress Notes (Signed)
Lawrence [name] [id number] 04/24/1956 65 y.o.  Subjective:   Patient ID:  Lawrence [name] is a 65 y.o. (DOB [birth year]) male.  Chief Complaint: No chief complaint on file.   HPI Lawrence [name] presents to the office today for follow-up of panic attacks, MDD, GAD, PTSD, and insomnia.  Previously followed by [place].  Describes mood today as "ok". Pleasant. Reports decreased tearfulness - "not lately". Mood symptoms - reports some depression, anxiety, and irritability. Anxiety - "higher than it normally is". Stating "I'm ddoing as best as can be expected with the state of the world". Not as anxious I was - feels more relaxed. Denies recent panic attacks. Feels like addition of Buspar has been helpful - denies toleration issues. Stating "it seems to be doing the job". Went to a class today - legal documents. Uses motorized scooter to aid in mobility. Using walker at home. Has been taking current medications for a several years and feel like they are helpful. Previously seen at the [place]. Stable interest and motivation. Taking medications as prescribed.  Energy levels vary - lower overall. Active, does not have a regular exercise routine.   Enjoys some usual interests and activities. Lives with significant other of 7 years. No family local. Has a good friend locally. Appetite fair. Weight loss 390 pounds - high protein, low carb diet. Sleeps well most nights. Averages 6 to 7 hours - doing quite well. Some daytime napping. Focus and concentration stable - "for the most part". Completing tasks. Managing aspects of household. Retired [name]. Disabled. Denies SI or HI.  Denies AH or VH.  Previous medication trials: Ambien    Flowsheet Row ED from 11/13/2020 in [facility] to Hosp-Admission (Discharged) from 10/16/2020 in [facility]  C-SSRS RISK CATEGORY No Risk No Risk       Review of Systems:  Review of Systems  Musculoskeletal: Negative  for gait problem.  Neurological: Negative for tremors.  Psychiatric/Behavioral:       Please refer to HPI    Medications: I have reviewed the patient's current medications.  Current Outpatient Medications  Medication Sig Dispense Refill  . acetaminophen (TYLENOL) 650 MG CR tablet Take 1,300 mg by mouth every 8 (eight) hours as needed for pain.    [name] albuterol (VENTOLIN HFA) 108 (90 Base) MCG/ACT inhaler Inhale into the lungs.    [name] allopurinol (ZYLOPRIM) 100 MG tablet Take 100 mg by mouth 2 (two) times daily.    [name] apixaban (ELIQUIS) 5 MG TABS tablet Take 1 tablet (5 mg total) by mouth 2 (two) times daily. 180 tablet 1  . ARIPiprazole (ABILIFY) 5 MG tablet Take 5 mg by mouth at bedtime.     [name] atorvastatin (LIPITOR) 40 MG tablet TAKE ONE TABLET BY MOUTH DAILY AT 6PM 90 tablet 2  . blood glucose meter kit and supplies Dispense based on patient and insurance preference. Use up to four times daily as directed. (FOR ICD-10 E10.9, E11.9). 1 each 0  . busPIRone (BUSPAR) 5 MG tablet Take 1 tablet (5 mg total) by mouth 3 (three) times daily. 270 tablet 1  . cetirizine (ZYRTEC) 10 MG tablet Take 10 mg by mouth at bedtime.     . Dulaglutide (TRULICITY) 4.5 TT/0.1XB SOPN Inject 4.5 mg as directed once a week. 4.5 mL 3  . ferrous sulfate 324 (65 Fe) MG TBEC Take 324 mg by mouth daily with breakfast.     . fluticasone (FLONASE) 50 MCG/ACT nasal  spray Place 2 sprays into both nostrils daily.     . isosorbide mononitrate (IMDUR) 30 MG 24 hr tablet Take 1 tablet (30 mg total) by mouth daily. 90 tablet 3  . LANTUS SOLOSTAR 100 UNIT/ML Solostar Pen Inject 12 Units into the skin every morning. 15 mL 3  . levETIRAcetam (KEPPRA) 1000 MG tablet Take 1 tablet (1,000 mg total) by mouth 2 (two) times daily. 60 tablet 0  . metFORMIN (GLUCOPHAGE) 1000 MG tablet Take 1,000 mg by mouth 2 (two) times daily with a meal.    . metoprolol succinate (TOPROL-XL) 25 MG 24 hr tablet Take 1 tablet (25 mg total) by mouth daily. 90  tablet 3  . omeprazole (PRILOSEC) 20 MG capsule Take 20 mg by mouth every morning.    [name] oxyCODONE (OXY IR/ROXICODONE) 5 MG immediate release tablet Take 5 mg by mouth every 8 (eight) hours as needed.    . potassium chloride SA (KLOR-CON) 20 MEQ tablet Take 2 tablets (40 mEq total) by mouth daily. 180 tablet 3  . prazosin (MINIPRESS) 2 MG capsule Take 4 mg by mouth at bedtime.    . pregabalin (LYRICA) 100 MG capsule Take 100 mg by mouth in the morning, at noon, and at bedtime.     . silver sulfADIAZINE (SILVADENE) 1 % cream Apply 1 application topically See admin instructions. Apply as directed once a day as directed to bilateral legs    . spironolactone (ALDACTONE) 25 MG tablet Take 1 tablet (25 mg total) by mouth daily. 10 tablet 0  . temazepam (RESTORIL) 15 MG capsule Take 1 capsule (15 mg total) by mouth at bedtime. 30 capsule 1  . torsemide (DEMADEX) 20 MG tablet Take 2 tablets (40 mg total) in the morning and 1 tablet (20 mg total) in the evening. 270 tablet 3  . traZODone (DESYREL) 100 MG tablet Take 2 tablets (200 mg total) by mouth at bedtime. 180 tablet 3  . venlafaxine XR (EFFEXOR-XR) 75 MG 24 hr capsule Take 3 capsules (225 mg total) by mouth daily with breakfast. 270 capsule 3  . Vitamin D, Ergocalciferol, (DRISDOL) 1.25 MG (50000 UNIT) CAPS capsule Take 50,000 Units by mouth every 7 (seven) days.     No current facility-administered medications for this visit.    Medication Side Effects: None  Allergies:  Allergies  Allergen Reactions  . Vancomycin Other (See Comments)    "Red Man Syndrome" 02/02/17: possible cause for rash under both arms  . Niacin Other (See Comments)  . Niacin And Related Other (See Comments)    Red man syndrome  . Tubersol [Tuberculin] Other (See Comments)    Reaction unknown  . Doxycycline Rash and Other (See Comments)    Past Medical History:  Diagnosis Date  . Acute on chronic diastolic CHF (congestive heart failure) ([place]) 01/13/2018  . Acute  respiratory failure with hypoxia ([place]) 01/13/2018  . Adenomatous colon polyp 2007  . Anasarca 01/31/2017  . Anemia   . Anxiety   . Arthritis   . Asthma   . Bilateral lower extremity edema 01/31/2017  . Bilateral lower leg cellulitis   . BPH without obstruction/lower urinary tract symptoms 02/22/2017  . Cellulitis 11/07/2017  . Cellulitis of lower extremity   . Chest pain   . Chronic cough   . Chronic diastolic CHF (congestive heart failure) ([place])   . Chronic venous stasis 03/07/2019  . COPD (chronic obstructive pulmonary disease) ([place])   . Coronary artery calcification seen on CAT scan   .  Depression   . Diabetic neuropathy ([place]) 09/11/2019  . Hematuria 02/22/2017  . History of anemia 12/26/2018  . History of cardiac cath    a.  01/2018 with angiographically minimal coronary disease with  up to 40% prox LAD and 20% mid LCx and OM2 with normal LVF and normal LVEDP..  . History of colon polyps 08/24/2018  . Hypertension   . Leukocytosis 08/18/2018  . Morbid obesity ([place])   . New onset type 2 diabetes mellitus ([place]) 11/08/2017  . OSA (obstructive sleep apnea)   . Pain due to onychomycosis of toenails of both feet 09/11/2019  . Peripheral neuropathy 02/22/2017  . Physical deconditioning 02/22/2017  . Primary osteoarthritis, left shoulder 03/05/2017  . PTSD (post-traumatic stress disorder)   . Pure hypercholesterolemia   . QT prolongation 03/07/2019  . Right foot pain 01/13/2018  . Shortness of breath   . Sinus tachycardia 03/07/2019  . Sleep apnea    CPAP  . Type 2 diabetes mellitus with vascular disease ([place]) 09/11/2019    Past Medical History, Surgical history, Social history, and Family history were reviewed and updated as appropriate.   Please see review of systems for further details on the patient's review from today.   Objective:   Physical Exam:  There were no vitals taken for this visit.  Physical Exam Constitutional:      General: He is not in acute distress. Musculoskeletal:         General: No deformity.  Neurological:     Mental Status: He is alert and oriented to person, place, and time.     Coordination: Coordination normal.  Psychiatric:        Attention and Perception: Attention and perception normal. He does not perceive auditory or visual hallucinations.        Mood and Affect: Mood normal. Mood is not anxious or depressed. Affect is not labile, blunt, angry or inappropriate.        Speech: Speech normal.        Behavior: Behavior normal.        Thought Content: Thought content normal. Thought content is not paranoid or delusional. Thought content does not include homicidal or suicidal ideation. Thought content does not include homicidal or suicidal plan.        Cognition and Memory: Cognition and memory normal.        Judgment: Judgment normal.     Comments: Insight intact     Lab Review:     Component Value Date/Time   NA 138 11/13/2020 1528   NA 135 05/20/2020 1038   K 3.9 11/13/2020 1528   CL 99 11/13/2020 1528   CO2 29 11/13/2020 1528   GLUCOSE 182 (H) 11/13/2020 1528   BUN 20 11/13/2020 1528   BUN 33 (H) 05/20/2020 1038   CREATININE 1.15 11/13/2020 1528   CALCIUM 8.7 (L) 11/13/2020 1528   PROT 6.5 11/13/2020 1528   ALBUMIN 3.3 (L) 11/13/2020 1528   AST 20 11/13/2020 1528   ALT 24 11/13/2020 1528   ALKPHOS 70 11/13/2020 1528   BILITOT 0.6 11/13/2020 1528   GFRNONAA >60 11/13/2020 1528   GFRAA 77 05/20/2020 1038       Component Value Date/Time   WBC 11.2 (H) 11/13/2020 1528   RBC 4.21 (L) 11/13/2020 1528   HGB 12.3 (L) 11/13/2020 1528   HCT 38.4 (L) 11/13/2020 1528   PLT 235 11/13/2020 1528   MCV 91.2 11/13/2020 1528   MCH 29.2 11/13/2020 1528   MCHC 32.0  11/13/2020 1528   RDW 15.5 11/13/2020 1528   LYMPHSABS 1.4 11/13/2020 1528   MONOABS 0.6 11/13/2020 1528   EOSABS 0.6 (H) 11/13/2020 1528   BASOSABS 0.0 11/13/2020 1528    No results found for: POCLITH, LITHIUM   No results found for: PHENYTOIN, PHENOBARB, VALPROATE, CBMZ    .res Assessment: Plan:     Plan:  PDMP reviewed  1. Prazosin $RemoveBef'2mg'wrEYNmMHzt$  - 2 tabs at hs 2. Temazepam $RemoveBefo'15mg'FMlyLolKWkZ$  at bedtime 3. Abilify $RemoveBe'5mg'iEFrjCydY$  daily 4. Trazadone $RemoveBefo'100mg'lczhxBvCeJt$  - 2 at bedtime 5. Effexor XR $RemoveBe'75mg'FjeyQtQYf$  - 3 every morning 6. Buspar $RemoveB'5mg'QFItdqTl$  TID   [place] - pain management  RTC 3 months  Patient advised to contact office with any questions, adverse effects, or acute worsening in signs and symptoms.  Discussed potential metabolic side effects associated with atypical antipsychotics, as well as potential risk for movement side effects. Advised pt to contact office if movement side effects occur.      Diagnoses and all orders for this visit:  Panic attacks  Generalized anxiety disorder  Major depressive disorder, recurrent episode, moderate (HCC)  PTSD (post-traumatic stress disorder)  Insomnia, unspecified type -     traZODone (DESYREL) 100 MG tablet; Take 2 tablets (200 mg total) by mouth at bedtime.     Please see After Visit Summary for patient specific instructions.  Future Appointments  Date Time Provider [address]  01/05/2021  2:45 PM Renato Shin, MD [facility] None  01/07/2021 10:20 AM Sueanne Margarita, MD [facility]  01/21/2021  8:35 AM [facility] ECHO 1 [facility]  03/04/2021  8:30 AM Marcial Pacas, MD [facility] None  03/11/2021  1:20 PM Jamiah Recore, Berdie Ogren, NP [facility] None  03/30/2021  2:00 PM Gardiner Barefoot, DPM [facility]  08/26/2021  1:00 PM Athira Janowicz, Berdie Ogren, NP [facility] None    No orders of the defined types were placed in this encounter.   -------------------------------

## 2021-01-01 DIAGNOSIS — E876 Hypokalemia: Secondary | ICD-10-CM | POA: Diagnosis not present

## 2021-01-01 DIAGNOSIS — I2510 Atherosclerotic heart disease of native coronary artery without angina pectoris: Secondary | ICD-10-CM | POA: Diagnosis not present

## 2021-01-01 DIAGNOSIS — J9601 Acute respiratory failure with hypoxia: Secondary | ICD-10-CM | POA: Diagnosis not present

## 2021-01-01 DIAGNOSIS — I130 Hypertensive heart and chronic kidney disease with heart failure and stage 1 through stage 4 chronic kidney disease, or unspecified chronic kidney disease: Secondary | ICD-10-CM | POA: Diagnosis not present

## 2021-01-01 DIAGNOSIS — I471 Supraventricular tachycardia: Secondary | ICD-10-CM | POA: Diagnosis not present

## 2021-01-01 DIAGNOSIS — E1140 Type 2 diabetes mellitus with diabetic neuropathy, unspecified: Secondary | ICD-10-CM | POA: Diagnosis not present

## 2021-01-01 DIAGNOSIS — N183 Chronic kidney disease, stage 3 unspecified: Secondary | ICD-10-CM | POA: Diagnosis not present

## 2021-01-01 DIAGNOSIS — J449 Chronic obstructive pulmonary disease, unspecified: Secondary | ICD-10-CM | POA: Diagnosis not present

## 2021-01-01 DIAGNOSIS — E785 Hyperlipidemia, unspecified: Secondary | ICD-10-CM | POA: Diagnosis not present

## 2021-01-01 DIAGNOSIS — R569 Unspecified convulsions: Secondary | ICD-10-CM | POA: Diagnosis not present

## 2021-01-01 DIAGNOSIS — E1169 Type 2 diabetes mellitus with other specified complication: Secondary | ICD-10-CM | POA: Diagnosis not present

## 2021-01-01 DIAGNOSIS — E1151 Type 2 diabetes mellitus with diabetic peripheral angiopathy without gangrene: Secondary | ICD-10-CM | POA: Diagnosis not present

## 2021-01-01 DIAGNOSIS — E1165 Type 2 diabetes mellitus with hyperglycemia: Secondary | ICD-10-CM | POA: Diagnosis not present

## 2021-01-01 DIAGNOSIS — M109 Gout, unspecified: Secondary | ICD-10-CM | POA: Diagnosis not present

## 2021-01-01 DIAGNOSIS — E871 Hypo-osmolality and hyponatremia: Secondary | ICD-10-CM | POA: Diagnosis not present

## 2021-01-01 DIAGNOSIS — G4733 Obstructive sleep apnea (adult) (pediatric): Secondary | ICD-10-CM | POA: Diagnosis not present

## 2021-01-01 DIAGNOSIS — I872 Venous insufficiency (chronic) (peripheral): Secondary | ICD-10-CM | POA: Diagnosis not present

## 2021-01-01 DIAGNOSIS — G4700 Insomnia, unspecified: Secondary | ICD-10-CM | POA: Diagnosis not present

## 2021-01-01 DIAGNOSIS — I5033 Acute on chronic diastolic (congestive) heart failure: Secondary | ICD-10-CM | POA: Diagnosis not present

## 2021-01-01 DIAGNOSIS — E1122 Type 2 diabetes mellitus with diabetic chronic kidney disease: Secondary | ICD-10-CM | POA: Diagnosis not present

## 2021-01-05 ENCOUNTER — Other Ambulatory Visit: Payer: Self-pay

## 2021-01-05 ENCOUNTER — Ambulatory Visit (INDEPENDENT_AMBULATORY_CARE_PROVIDER_SITE_OTHER): Payer: Medicare Other | Admitting: Endocrinology

## 2021-01-05 VITALS — BP 130/80 | HR 88 | Ht 72.0 in

## 2021-01-05 DIAGNOSIS — M109 Gout, unspecified: Secondary | ICD-10-CM | POA: Diagnosis not present

## 2021-01-05 DIAGNOSIS — E871 Hypo-osmolality and hyponatremia: Secondary | ICD-10-CM | POA: Diagnosis not present

## 2021-01-05 DIAGNOSIS — E1169 Type 2 diabetes mellitus with other specified complication: Secondary | ICD-10-CM | POA: Diagnosis not present

## 2021-01-05 DIAGNOSIS — J9601 Acute respiratory failure with hypoxia: Secondary | ICD-10-CM | POA: Diagnosis not present

## 2021-01-05 DIAGNOSIS — E1140 Type 2 diabetes mellitus with diabetic neuropathy, unspecified: Secondary | ICD-10-CM | POA: Diagnosis not present

## 2021-01-05 DIAGNOSIS — I2510 Atherosclerotic heart disease of native coronary artery without angina pectoris: Secondary | ICD-10-CM | POA: Diagnosis not present

## 2021-01-05 DIAGNOSIS — E1159 Type 2 diabetes mellitus with other circulatory complications: Secondary | ICD-10-CM | POA: Diagnosis not present

## 2021-01-05 DIAGNOSIS — I130 Hypertensive heart and chronic kidney disease with heart failure and stage 1 through stage 4 chronic kidney disease, or unspecified chronic kidney disease: Secondary | ICD-10-CM | POA: Diagnosis not present

## 2021-01-05 DIAGNOSIS — E1151 Type 2 diabetes mellitus with diabetic peripheral angiopathy without gangrene: Secondary | ICD-10-CM | POA: Diagnosis not present

## 2021-01-05 DIAGNOSIS — N183 Chronic kidney disease, stage 3 unspecified: Secondary | ICD-10-CM | POA: Diagnosis not present

## 2021-01-05 DIAGNOSIS — E876 Hypokalemia: Secondary | ICD-10-CM | POA: Diagnosis not present

## 2021-01-05 DIAGNOSIS — G4733 Obstructive sleep apnea (adult) (pediatric): Secondary | ICD-10-CM | POA: Diagnosis not present

## 2021-01-05 DIAGNOSIS — I872 Venous insufficiency (chronic) (peripheral): Secondary | ICD-10-CM | POA: Diagnosis not present

## 2021-01-05 DIAGNOSIS — J449 Chronic obstructive pulmonary disease, unspecified: Secondary | ICD-10-CM | POA: Diagnosis not present

## 2021-01-05 DIAGNOSIS — I5033 Acute on chronic diastolic (congestive) heart failure: Secondary | ICD-10-CM | POA: Diagnosis not present

## 2021-01-05 DIAGNOSIS — G4700 Insomnia, unspecified: Secondary | ICD-10-CM | POA: Diagnosis not present

## 2021-01-05 DIAGNOSIS — E1122 Type 2 diabetes mellitus with diabetic chronic kidney disease: Secondary | ICD-10-CM | POA: Diagnosis not present

## 2021-01-05 DIAGNOSIS — E1165 Type 2 diabetes mellitus with hyperglycemia: Secondary | ICD-10-CM | POA: Diagnosis not present

## 2021-01-05 DIAGNOSIS — R569 Unspecified convulsions: Secondary | ICD-10-CM | POA: Diagnosis not present

## 2021-01-05 DIAGNOSIS — E785 Hyperlipidemia, unspecified: Secondary | ICD-10-CM | POA: Diagnosis not present

## 2021-01-05 DIAGNOSIS — I471 Supraventricular tachycardia: Secondary | ICD-10-CM | POA: Diagnosis not present

## 2021-01-05 MED ORDER — DAPAGLIFLOZIN PROPANEDIOL 10 MG PO TABS: 10.0000 mg | ORAL_TABLET | Freq: Every day | ORAL | 3 refills | Status: DC

## 2021-01-05 NOTE — Progress Notes (Signed)
Subjective:    Patient ID: Lawrence [name], male    DOB: 1956-04-12, 65 y.o.   MRN: [id number]  HPI Pt returns for f/u of diabetes mellitus: DM type: Insulin-requiring type 2 Dx'ed: 9326 Complications: PPN, CAD, and leg ulcers Therapy: insulin since 7124, Trulicity, and metformin.   DKA: never Severe hypoglycemia: never Pancreatitis: never Pancreatic imaging:  SDOH: memory loss limts rx Other: he takes QD insulin, at least for now Interval history: no cbg record, but states cbg's 114-170.  No new sxs.  He takes meds as rx'ed.  Past Medical History:  Diagnosis Date  . Acute on chronic diastolic CHF (congestive heart failure) ([place]) 01/13/2018  . Acute respiratory failure with hypoxia ([place]) 01/13/2018  . Adenomatous colon polyp 2007  . Anasarca 01/31/2017  . Anemia   . Anxiety   . Arthritis   . Asthma   . Bilateral lower extremity edema 01/31/2017  . Bilateral lower leg cellulitis   . BPH without obstruction/lower urinary tract symptoms 02/22/2017  . Cellulitis 11/07/2017  . Cellulitis of lower extremity   . Chest pain   . Chronic cough   . Chronic diastolic CHF (congestive heart failure) ([place])   . Chronic venous stasis 03/07/2019  . COPD (chronic obstructive pulmonary disease) ([place])   . Coronary artery calcification seen on CAT scan   . Depression   . Diabetic neuropathy ([place]) 09/11/2019  . Hematuria 02/22/2017  . History of anemia 12/26/2018  . History of cardiac cath    a.  01/2018 with angiographically minimal coronary disease with  up to 40% prox LAD and 20% mid LCx and OM2 with normal LVF and normal LVEDP..  . History of colon polyps 08/24/2018  . Hypertension   . Leukocytosis 08/18/2018  . Morbid obesity ([place])   . New onset type 2 diabetes mellitus ([place]) 11/08/2017  . OSA (obstructive sleep apnea)   . Pain due to onychomycosis of toenails of both feet 09/11/2019  . Peripheral neuropathy 02/22/2017  . Physical deconditioning 02/22/2017  . Primary osteoarthritis, left shoulder  03/05/2017  . PTSD (post-traumatic stress disorder)   . Pure hypercholesterolemia   . QT prolongation 03/07/2019  . Right foot pain 01/13/2018  . Shortness of breath   . Sinus tachycardia 03/07/2019  . Sleep apnea    CPAP  . Type 2 diabetes mellitus with vascular disease ([place]) 09/11/2019    Past Surgical History:  Procedure Laterality Date  . ENDOVENOUS ABLATION SAPHENOUS VEIN W/ LASER Right 08/20/2020   endovenous laser ablation right greater saphenous vein by Gae Gallop MD   . [place]     left knee replacement x 2  . KNEE ARTHROSCOPY Bilateral   . LEFT HEART CATH AND CORONARY ANGIOGRAPHY N/A 01/17/2018   Procedure: LEFT HEART CATH AND CORONARY ANGIOGRAPHY;  Surgeon: Leonie Man, MD;  Location: [facility];  Service: Cardiovascular;  Laterality: N/A;  . UMBILICAL HERNIA REPAIR      Social History   Socioeconomic History  . Marital status: Single    Spouse name: Not on file  . Number of children: 1  . Years of education: Not on file  . Highest education level: Not on file  Occupational History  . Occupation: retired  Tobacco Use  . Smoking status: Former Smoker    Quit date: 03/07/2016    Years since quitting: 4.8  . Smokeless tobacco: Never Used  Substance and Sexual Activity  . Alcohol use: Not Currently    Comment: rare  . Drug  use: No  . Sexual activity: Not on file  Other Topics Concern  . Not on file  Social History Narrative  . Not on file   Social Determinants of Health   Financial Resource Strain: Not on file  Food Insecurity: Not on file  Transportation Needs: Not on file  Physical Activity: Not on file  Stress: Not on file  Social Connections: Not on file  Intimate Partner Violence: Not on file    Current Outpatient Medications on File Prior to Visit  Medication Sig Dispense Refill  . acetaminophen (TYLENOL) 650 MG CR tablet Take 1,300 mg by mouth every 8 (eight) hours as needed for pain.    [name] albuterol (VENTOLIN HFA) 108 (90  Base) MCG/ACT inhaler Inhale into the lungs.    [name] allopurinol (ZYLOPRIM) 100 MG tablet Take 100 mg by mouth 2 (two) times daily.    [name] apixaban (ELIQUIS) 5 MG TABS tablet Take 1 tablet (5 mg total) by mouth 2 (two) times daily. 180 tablet 1  . ARIPiprazole (ABILIFY) 5 MG tablet Take 5 mg by mouth at bedtime.     [name] atorvastatin (LIPITOR) 40 MG tablet TAKE ONE TABLET BY MOUTH DAILY AT 6PM 90 tablet 2  . blood glucose meter kit and supplies Dispense based on patient and insurance preference. Use up to four times daily as directed. (FOR ICD-10 E10.9, E11.9). 1 each 0  . busPIRone (BUSPAR) 5 MG tablet Take 1 tablet (5 mg total) by mouth 3 (three) times daily. 270 tablet 1  . cetirizine (ZYRTEC) 10 MG tablet Take 10 mg by mouth at bedtime.     . Dulaglutide (TRULICITY) 4.5 KM/6.3OT SOPN Inject 4.5 mg as directed once a week. 4.5 mL 3  . ferrous sulfate 324 (65 Fe) MG TBEC Take 324 mg by mouth daily with breakfast.     . fluticasone (FLONASE) 50 MCG/ACT nasal spray Place 2 sprays into both nostrils daily.     . isosorbide mononitrate (IMDUR) 30 MG 24 hr tablet Take 1 tablet (30 mg total) by mouth daily. 90 tablet 3  . LANTUS SOLOSTAR 100 UNIT/ML Solostar Pen Inject 12 Units into the skin every morning. 15 mL 3  . metFORMIN (GLUCOPHAGE) 1000 MG tablet Take 1,000 mg by mouth 2 (two) times daily with a meal.    . metoprolol succinate (TOPROL-XL) 25 MG 24 hr tablet Take 1 tablet (25 mg total) by mouth daily. 90 tablet 3  . omeprazole (PRILOSEC) 20 MG capsule Take 20 mg by mouth every morning.    [name] oxyCODONE (OXY IR/ROXICODONE) 5 MG immediate release tablet Take 5 mg by mouth every 8 (eight) hours as needed.    . potassium chloride SA (KLOR-CON) 20 MEQ tablet Take 2 tablets (40 mEq total) by mouth daily. 180 tablet 3  . prazosin (MINIPRESS) 2 MG capsule Take 4 mg by mouth at bedtime.    . pregabalin (LYRICA) 100 MG capsule Take 100 mg by mouth in the morning, at noon, and at bedtime.     . silver sulfADIAZINE  (SILVADENE) 1 % cream Apply 1 application topically See admin instructions. Apply as directed once a day as directed to bilateral legs    . spironolactone (ALDACTONE) 25 MG tablet Take 1 tablet (25 mg total) by mouth daily. 10 tablet 0  . temazepam (RESTORIL) 15 MG capsule Take 1 capsule (15 mg total) by mouth at bedtime. 30 capsule 1  . torsemide (DEMADEX) 20 MG tablet Take 2 tablets (40 mg total) in the  morning and 1 tablet (20 mg total) in the evening. 270 tablet 3  . traZODone (DESYREL) 100 MG tablet Take 2 tablets (200 mg total) by mouth at bedtime. 180 tablet 3  . venlafaxine XR (EFFEXOR-XR) 75 MG 24 hr capsule Take 3 capsules (225 mg total) by mouth daily with breakfast. 270 capsule 3  . Vitamin D, Ergocalciferol, (DRISDOL) 1.25 MG (50000 UNIT) CAPS capsule Take 50,000 Units by mouth every 7 (seven) days.    [name] levETIRAcetam (KEPPRA) 1000 MG tablet Take 1 tablet (1,000 mg total) by mouth 2 (two) times daily. 60 tablet 0   No current facility-administered medications on file prior to visit.    Allergies  Allergen Reactions  . Vancomycin Other (See Comments)    "Red Man Syndrome" 02/02/17: possible cause for rash under both arms  . Niacin Other (See Comments)  . Niacin And Related Other (See Comments)    Red man syndrome  . Tubersol [Tuberculin] Other (See Comments)    Reaction unknown  . Doxycycline Rash and Other (See Comments)    Family History  Problem Relation Age of Onset  . CAD Maternal Grandfather   . Diabetes Other   . Diabetes Mellitus II Neg Hx   . Colon cancer Neg Hx   . Esophageal cancer Neg Hx   . Inflammatory bowel disease Neg Hx   . Liver disease Neg Hx   . Pancreatic cancer Neg Hx   . Rectal cancer Neg Hx   . Stomach cancer Neg Hx     BP 130/80 (BP Location: Right Arm, Patient Position: Sitting, Cuff Size: Large)   Pulse 88   Ht 6' (1.829 m)   SpO2 90%   BMI 54.25 kg/m    Review of Systems He denies hypoglycemia.      Objective:   Physical  Exam VITAL SIGNS:  See vs page.   GENERAL: no distress.  In [place].     Lab Results  Component Value Date   CREATININE 1.15 11/13/2020   BUN 20 11/13/2020   NA 138 11/13/2020   K 3.9 11/13/2020   CL 99 11/13/2020   CO2 29 11/13/2020   outside test results are reviewed:  A1c=8.1%    Assessment & Plan:  Insulin-requiring type 2 DM: uncontrolled.     Patient Instructions  check your blood sugar twice a day.  vary the time of day when you check, between before the 3 meals, and at bedtime.  also check if you have symptoms of your blood sugar being too high or too low.  please keep a record of the readings and bring it to your next appointment here (or you can bring the meter itself).  You can write it on any piece of paper.  please call us sooner if your blood sugar goes below 70, or if most of your readings are over 200. We will need to take this complex situation in stages. I have sent a prescription to your pharmacy, to add "[place]ade."   Please continue the same other diabetes medications. There are other meds we can add on if necessary.   Please come back for a follow-up appointment in 2 months.

## 2021-01-05 NOTE — Patient Instructions (Addendum)
check your blood sugar twice a day.  vary the time of day when you check, between before the 3 meals, and at bedtime.  also check if you have symptoms of your blood sugar being too high or too low.  please keep a record of the readings and bring it to your next appointment here (or you can bring the meter itself).  You can write it on any piece of paper.  please call us sooner if your blood sugar goes below 70, or if most of your readings are over 200. We will need to take this complex situation in stages. I have sent a prescription to your pharmacy, to add "Wilder Glade."   Please continue the same other diabetes medications. There are other meds we can add on if necessary.   Please come back for a follow-up appointment in 2 months.

## 2021-01-07 ENCOUNTER — Other Ambulatory Visit: Payer: Self-pay

## 2021-01-07 ENCOUNTER — Other Ambulatory Visit: Payer: Self-pay | Admitting: *Deleted

## 2021-01-07 ENCOUNTER — Ambulatory Visit (INDEPENDENT_AMBULATORY_CARE_PROVIDER_SITE_OTHER): Payer: Medicare Other | Admitting: Cardiology

## 2021-01-07 ENCOUNTER — Encounter: Payer: Self-pay | Admitting: Cardiology

## 2021-01-07 VITALS — BP 126/64 | HR 92 | Ht 74.0 in | Wt 389.0 lb

## 2021-01-07 DIAGNOSIS — E7800 Pure hypercholesterolemia, unspecified: Secondary | ICD-10-CM | POA: Diagnosis not present

## 2021-01-07 DIAGNOSIS — I480 Paroxysmal atrial fibrillation: Secondary | ICD-10-CM | POA: Diagnosis not present

## 2021-01-07 DIAGNOSIS — I471 Supraventricular tachycardia, unspecified: Secondary | ICD-10-CM

## 2021-01-07 DIAGNOSIS — I5032 Chronic diastolic (congestive) heart failure: Secondary | ICD-10-CM | POA: Diagnosis not present

## 2021-01-07 DIAGNOSIS — I2510 Atherosclerotic heart disease of native coronary artery without angina pectoris: Secondary | ICD-10-CM

## 2021-01-07 DIAGNOSIS — I10 Essential (primary) hypertension: Secondary | ICD-10-CM

## 2021-01-07 DIAGNOSIS — I2583 Coronary atherosclerosis due to lipid rich plaque: Secondary | ICD-10-CM

## 2021-01-07 LAB — BASIC METABOLIC PANEL
BUN/Creatinine Ratio: 24 (ref 10–24)
BUN: 22 mg/dL (ref 8–27)
CO2: 26 mmol/L (ref 20–29)
Calcium: 9.6 mg/dL (ref 8.6–10.2)
Chloride: 95 mmol/L — ABNORMAL LOW (ref 96–106)
Creatinine, Ser: 0.93 mg/dL (ref 0.76–1.27)
Glucose: 134 mg/dL — ABNORMAL HIGH (ref 65–99)
Potassium: 4.4 mmol/L (ref 3.5–5.2)
Sodium: 136 mmol/L (ref 134–144)
eGFR: 92 mL/min/{1.73_m2} (ref >59)

## 2021-01-07 MED ORDER — ATORVASTATIN CALCIUM 40 MG PO TABS: ORAL_TABLET | ORAL | 2 refills | Status: DC

## 2021-01-07 MED ORDER — APIXABAN 5 MG PO TABS
5.0000 mg | ORAL_TABLET | Freq: Two times a day (BID) | ORAL | 2 refills | Status: DC
Start: 2021-01-07 — End: 2021-06-24

## 2021-01-07 MED ORDER — ISOSORBIDE MONONITRATE ER 30 MG PO TB24: 30.0000 mg | ORAL_TABLET | Freq: Every day | ORAL | 2 refills | Status: DC

## 2021-01-07 MED ORDER — POTASSIUM CHLORIDE CRYS ER 20 MEQ PO TBCR: 40.0000 meq | EXTENDED_RELEASE_TABLET | Freq: Every day | ORAL | 2 refills | Status: DC

## 2021-01-07 MED ORDER — PRAZOSIN HCL 2 MG PO CAPS: 4.0000 mg | ORAL_CAPSULE | Freq: Every day | ORAL | 2 refills | Status: DC

## 2021-01-07 MED ORDER — TORSEMIDE 20 MG PO TABS: ORAL_TABLET | ORAL | 2 refills | Status: DC

## 2021-01-07 MED ORDER — METOPROLOL SUCCINATE ER 25 MG PO TB24: 25.0000 mg | ORAL_TABLET | Freq: Every day | ORAL | 2 refills | Status: DC

## 2021-01-07 MED ORDER — SPIRONOLACTONE 25 MG PO TABS: 25.0000 mg | ORAL_TABLET | Freq: Every day | ORAL | 2 refills | Status: DC

## 2021-01-07 NOTE — Progress Notes (Signed)
Cardiology Office Note:    Date:  01/07/2021   ID:  Lawrence [name], DOB [birth year], MRN [id number]  PCP:  [name], PA-C  Cardiologist:  Fransico Him, MD    Referring MD: [name], PA-C   Chief Complaint  Patient presents with  . Coronary Artery Disease  . Hypertension  . Hyperlipidemia  . Atrial Fibrillation  . Congestive Heart Failure    History of Present Illness:    Lawrence [name] is a 65 y.o. male with a hx of nonobstructive coronary artery disease, hypertension, hyperlipidemia, diastolic heart failure, OSA on BiPAP, super morbid obesity, depression, anxiety.  He is a DNR.  He was seen in the hospital 01/25/2018 for evaluation of chest pain and shortness of breath.  Left heart catheterization was subsequently performed which showed minimal coronary artery disease.    He presented the emergency room 02/19/2019 with palpitations.  No arrhythmias were noted.  He wore a ZIO patch which showed multiple episodes of SVT. He was seen by EP and Toprol was increased to 75mg  BID.   Lawrence [name] is morbidly obese.  His activity level is significantly impaired.  He uses a walker at home and a motorized chair when out.  He has chronic lower extremity edema.   He was recently hospitalized in late March with acute on chronic diastolic CHF and diuresed 18L and was placed on 1200cc fluid restriction for hyponatremia.  On discharge Na was 130 and SCr was 0.99.  He also has been wearing a heart monitor to assess for possible afib.  Tele in hospital showed SVT and could not discern whether it was afib or PAT.  This was controlled on Toprol XL 25mg  daily.    When I last saw him he was having CP and nuclear stress test showed no ischemia.  He is followed by EP for PAF and is on Eliquis.  His last heart monitor showed an afib burden of 1%.  He called in recently and was having increased CP with anxiety and emotional stress and started on Imdur and Buspar and that has improved his CP.    He  is here today for followup and is doing well. He still has CP but only occurs with emotional stress and anxiety and not with exertion.  He thinks that the Imdur has helped some.  He denies any PND, orthopnea, dizziness or syncope. He has had some increased afib recently and was seen by EP.  He has chronic DOE and LE edema but thinks that it is stable and unchanged.  He is compliant with his meds and is tolerating meds with no SE.    Past Medical History:  Diagnosis Date  . Acute on chronic diastolic CHF (congestive heart failure) ([place]) 01/13/2018  . Acute respiratory failure with hypoxia ([place]) 01/13/2018  . Adenomatous colon polyp 2007  . Anasarca 01/31/2017  . Anemia   . Anxiety   . Arthritis   . Asthma   . Bilateral lower extremity edema 01/31/2017  . Bilateral lower leg cellulitis   . BPH without obstruction/lower urinary tract symptoms 02/22/2017  . Cellulitis 11/07/2017  . Cellulitis of lower extremity   . Chest pain   . Chronic cough   . Chronic diastolic CHF (congestive heart failure) ([place])   . Chronic venous stasis 03/07/2019  . COPD (chronic obstructive pulmonary disease) ([place])   . Coronary artery calcification seen on CAT scan   . Depression   . Diabetic neuropathy ([place]) 09/11/2019  . Hematuria 02/22/2017  .  History of anemia 12/26/2018  . History of cardiac cath    a.  01/2018 with angiographically minimal coronary disease with  up to 40% prox LAD and 20% mid LCx and OM2 with normal LVF and normal LVEDP..  . History of colon polyps 08/24/2018  . Hypertension   . Leukocytosis 08/18/2018  . Morbid obesity ([place])   . New onset type 2 diabetes mellitus ([place]) 11/08/2017  . OSA (obstructive sleep apnea)   . Pain due to onychomycosis of toenails of both feet 09/11/2019  . Peripheral neuropathy 02/22/2017  . Physical deconditioning 02/22/2017  . Primary osteoarthritis, left shoulder 03/05/2017  . PTSD (post-traumatic stress disorder)   . Pure hypercholesterolemia   . QT prolongation 03/07/2019  .  Right foot pain 01/13/2018  . Shortness of breath   . Sinus tachycardia 03/07/2019  . Sleep apnea    CPAP  . Type 2 diabetes mellitus with vascular disease ([place]) 09/11/2019    Past Surgical History:  Procedure Laterality Date  . ENDOVENOUS ABLATION SAPHENOUS VEIN W/ LASER Right 08/20/2020   endovenous laser ablation right greater saphenous vein by Gae Gallop MD   . [place]     left knee replacement x 2  . KNEE ARTHROSCOPY Bilateral   . LEFT HEART CATH AND CORONARY ANGIOGRAPHY N/A 01/17/2018   Procedure: LEFT HEART CATH AND CORONARY ANGIOGRAPHY;  Surgeon: Leonie Man, MD;  Location: [facility];  Service: Cardiovascular;  Laterality: N/A;  . UMBILICAL HERNIA REPAIR      Current Medications: No outpatient medications have been marked as taking for the 01/07/21 encounter (Office Visit) with Sueanne Margarita, MD.     Allergies:   Vancomycin, Niacin, Niacin and related, Tubersol [tuberculin], and Doxycycline   Social History   Socioeconomic History  . Marital status: Single    Spouse name: Not on file  . Number of children: 1  . Years of education: Not on file  . Highest education level: Not on file  Occupational History  . Occupation: retired  Tobacco Use  . Smoking status: Former Smoker    Quit date: 03/07/2016    Years since quitting: 4.8  . Smokeless tobacco: Never Used  Substance and Sexual Activity  . Alcohol use: Not Currently    Comment: rare  . Drug use: No  . Sexual activity: Not on file  Other Topics Concern  . Not on file  Social History Narrative  . Not on file   Social Determinants of Health   Financial Resource Strain: Not on file  Food Insecurity: Not on file  Transportation Needs: Not on file  Physical Activity: Not on file  Stress: Not on file  Social Connections: Not on file     Family History: The patient's family history includes CAD in his maternal grandfather; Diabetes in an other family member. There is no history of  Diabetes Mellitus II, Colon cancer, Esophageal cancer, Inflammatory bowel disease, Liver disease, Pancreatic cancer, Rectal cancer, or Stomach cancer.  ROS:   Please see the history of present illness.    ROS  All other systems reviewed and negative.   EKGs/Labs/Other Studies Reviewed:    The following studies were reviewed today: PAP compliance download from [place], outside labs   EKG:  EKG is ordered today and showed NSR at 97bpm with PAC and low voltage QRS with nonspecific T wave abnormality  Recent Labs: 04/01/2020: TSH 1.690 10/21/2020: Magnesium 2.1 11/13/2020: ALT 24; B Natriuretic Peptide 40.7; BUN 20; Creatinine, Ser 1.15;  Hemoglobin 12.3; Platelets 235; Potassium 3.9; Sodium 138   Recent Lipid Panel    Component Value Date/Time   CHOL 116 10/17/2020 0347   TRIG 127 10/17/2020 0347   HDL 33 (L) 10/17/2020 0347   CHOLHDL 3.5 10/17/2020 0347   VLDL 25 10/17/2020 0347   LDLCALC 58 10/17/2020 0347    Physical Exam:    VS:  BP 126/64   Pulse 92   Ht 6\' 2"  (1.88 m)   Wt (!) 389 lb (176.4 kg)   SpO2 97%   BMI 49.94 kg/m     Wt Readings from Last 3 Encounters:  01/07/21 (!) 389 lb (176.4 kg)  12/09/20 (!) 400 lb (181.4 kg)  11/24/20 (!) 400 lb (181.4 kg)     GEN: Well nourished, well developed in no acute distress HEENT: Normal NECK: No JVD; No carotid bruits LYMPHATICS: No lymphadenopathy CARDIAC:RRR, no murmurs, rubs, gallops RESPIRATORY:  Clear to auscultation without rales, wheezing or rhonchi  ABDOMEN: Soft, non-tender, non-distended MUSCULOSKELETAL:  No edema; No deformity  SKIN: Warm and dry NEUROLOGIC:  Alert and oriented x 3 PSYCHIATRIC:  Normal affect    ASSESSMENT:    1. PAF (paroxysmal atrial fibrillation) ([place])   2. Coronary artery disease due to lipid rich plaque   3. Essential hypertension   4. Pure hypercholesterolemia   5. Chronic diastolic CHF (congestive heart failure) ([facility])    PLAN:    In order of problems listed above:  1.   SVT/PAF -followed by EP  -palpitations still present occasionally but significantly improved on Toprol XL  -recent event monitor showed multiple episodes of PAF/PAT but afib burden 1% -now on DOAC with no complaints of bleeding -he is maintaining NSR on exam today -Continue prescription drug management with Eliquis 5mg  BID and Toprol Xl 25mg  daily  2.  ASCAD -minimal coronary artery disease by cath 2019 -he keeps having CP that is both exertional and nonexertional with no other associated sx -Lexiscan myoivew in April 2022 showed no ischemia -continue ASA, BB and statin  3.  HTN -BP is well controlled on exam today -Continue prescription drug management with Prazosin 4mg  qhs, Lisinopril 20mg  daily, spiro 25mg  daily and Toprol XL 25mg  daily with PRN refills -repeat BMET  4.  HLD -LDL goal < 70 -LDL at goal at 58 in March 2022 -Continue prescription drug management with Atorvastatin 40mg  daily with PRN refills  5.  Chronic diastolic CHF -volume status if very difficult to assess in setting of morbid obesity -he has chronic LE edema from morbid obesity and chronic venous insuff which are stable  -he has been trying to lose weight and is down 8lbs since April -Continue prescription drug management with Demadex 40mg  qam and 20mg  qpm and Farxiga 10mg  daily -check BMET today  Followup with me in 6 omnths  Medication Adjustments/Labs and Tests Ordered: Current medicines are reviewed at length with the patient today.  Concerns regarding medicines are outlined above.  No orders of the defined types were placed in this encounter.  No orders of the defined types were placed in this encounter.   Signed, Fransico Him, MD  01/07/2021 9:46 AM    [facility]

## 2021-01-07 NOTE — Patient Instructions (Signed)
Medication Instructions:  Your physician recommends that you continue on your current medications as directed. Please refer to the Current Medication list given to you today.  *If you need a refill on your cardiac medications before your next appointment, please call your pharmacy*   Lab Work: BMET today If you have labs (blood work) drawn today and your tests are completely normal, you will receive your results only by: [name] MyChart Message (if you have MyChart) OR . A paper copy in the mail If you have any lab test that is abnormal or we need to change your treatment, we will call you to review the results.   Testing/Procedures: None   Follow-Up: At [facility], you and your health needs are our priority.  As part of our continuing mission to provide you with exceptional heart care, we have created designated Provider Care Teams.  These Care Teams include your primary Cardiologist (physician) and Advanced Practice Providers (APPs -  Physician Assistants and Nurse Practitioners) who all work together to provide you with the care you need, when you need it.  We recommend signing up for the patient portal called "MyChart".  Sign up information is provided on this After Visit Summary.  MyChart is used to connect with patients for Virtual Visits (Telemedicine).  Patients are able to view lab/test results, encounter notes, upcoming appointments, etc.  Non-urgent messages can be sent to your provider as well.   To learn more about what you can do with MyChart, go to [email or web address].    Your next appointment:   6 month(s)  The format for your next appointment:   In Person  Provider:   You may see Fransico Him, MD or one of the following Advanced Practice Providers on your designated Care Team:    Melina Copa, PA-C  Ermalinda Barrios, PA-C    Other Instructions

## 2021-01-07 NOTE — Addendum Note (Signed)
Addended by: Willeen Cass on: 01/07/2021 10:32 AM   Modules accepted: Orders

## 2021-01-08 ENCOUNTER — Other Ambulatory Visit: Payer: Self-pay

## 2021-01-08 ENCOUNTER — Telehealth: Payer: Self-pay | Admitting: Adult Health

## 2021-01-08 DIAGNOSIS — I5033 Acute on chronic diastolic (congestive) heart failure: Secondary | ICD-10-CM | POA: Diagnosis not present

## 2021-01-08 DIAGNOSIS — N183 Chronic kidney disease, stage 3 unspecified: Secondary | ICD-10-CM | POA: Diagnosis not present

## 2021-01-08 DIAGNOSIS — E1165 Type 2 diabetes mellitus with hyperglycemia: Secondary | ICD-10-CM | POA: Diagnosis not present

## 2021-01-08 DIAGNOSIS — I2510 Atherosclerotic heart disease of native coronary artery without angina pectoris: Secondary | ICD-10-CM | POA: Diagnosis not present

## 2021-01-08 DIAGNOSIS — I471 Supraventricular tachycardia: Secondary | ICD-10-CM | POA: Diagnosis not present

## 2021-01-08 DIAGNOSIS — M109 Gout, unspecified: Secondary | ICD-10-CM | POA: Diagnosis not present

## 2021-01-08 DIAGNOSIS — E1140 Type 2 diabetes mellitus with diabetic neuropathy, unspecified: Secondary | ICD-10-CM | POA: Diagnosis not present

## 2021-01-08 DIAGNOSIS — E1169 Type 2 diabetes mellitus with other specified complication: Secondary | ICD-10-CM | POA: Diagnosis not present

## 2021-01-08 DIAGNOSIS — E871 Hypo-osmolality and hyponatremia: Secondary | ICD-10-CM | POA: Diagnosis not present

## 2021-01-08 DIAGNOSIS — I130 Hypertensive heart and chronic kidney disease with heart failure and stage 1 through stage 4 chronic kidney disease, or unspecified chronic kidney disease: Secondary | ICD-10-CM | POA: Diagnosis not present

## 2021-01-08 DIAGNOSIS — G4700 Insomnia, unspecified: Secondary | ICD-10-CM | POA: Diagnosis not present

## 2021-01-08 DIAGNOSIS — G4733 Obstructive sleep apnea (adult) (pediatric): Secondary | ICD-10-CM | POA: Diagnosis not present

## 2021-01-08 DIAGNOSIS — J9601 Acute respiratory failure with hypoxia: Secondary | ICD-10-CM | POA: Diagnosis not present

## 2021-01-08 DIAGNOSIS — R569 Unspecified convulsions: Secondary | ICD-10-CM | POA: Diagnosis not present

## 2021-01-08 DIAGNOSIS — E1122 Type 2 diabetes mellitus with diabetic chronic kidney disease: Secondary | ICD-10-CM | POA: Diagnosis not present

## 2021-01-08 DIAGNOSIS — E876 Hypokalemia: Secondary | ICD-10-CM | POA: Diagnosis not present

## 2021-01-08 DIAGNOSIS — E785 Hyperlipidemia, unspecified: Secondary | ICD-10-CM | POA: Diagnosis not present

## 2021-01-08 DIAGNOSIS — E1151 Type 2 diabetes mellitus with diabetic peripheral angiopathy without gangrene: Secondary | ICD-10-CM | POA: Diagnosis not present

## 2021-01-08 DIAGNOSIS — I872 Venous insufficiency (chronic) (peripheral): Secondary | ICD-10-CM | POA: Diagnosis not present

## 2021-01-08 DIAGNOSIS — J449 Chronic obstructive pulmonary disease, unspecified: Secondary | ICD-10-CM | POA: Diagnosis not present

## 2021-01-08 MED ORDER — ARIPIPRAZOLE 5 MG PO TABS: 5.0000 mg | ORAL_TABLET | Freq: Every day | ORAL | 2 refills | Status: DC

## 2021-01-08 NOTE — Telephone Encounter (Signed)
Ok to send

## 2021-01-08 NOTE — Telephone Encounter (Signed)
Rx sent 

## 2021-01-08 NOTE — Telephone Encounter (Signed)
Please review

## 2021-01-08 NOTE — Telephone Encounter (Signed)
Pt would like a new rx for Aripiprazole 5mg . Pt said that he was getting it from the [place], but not now. Please send to [facility].

## 2021-01-12 DIAGNOSIS — E1165 Type 2 diabetes mellitus with hyperglycemia: Secondary | ICD-10-CM | POA: Diagnosis not present

## 2021-01-12 DIAGNOSIS — J449 Chronic obstructive pulmonary disease, unspecified: Secondary | ICD-10-CM | POA: Diagnosis not present

## 2021-01-12 DIAGNOSIS — I130 Hypertensive heart and chronic kidney disease with heart failure and stage 1 through stage 4 chronic kidney disease, or unspecified chronic kidney disease: Secondary | ICD-10-CM | POA: Diagnosis not present

## 2021-01-12 DIAGNOSIS — J9601 Acute respiratory failure with hypoxia: Secondary | ICD-10-CM | POA: Diagnosis not present

## 2021-01-12 DIAGNOSIS — E871 Hypo-osmolality and hyponatremia: Secondary | ICD-10-CM | POA: Diagnosis not present

## 2021-01-12 DIAGNOSIS — I872 Venous insufficiency (chronic) (peripheral): Secondary | ICD-10-CM | POA: Diagnosis not present

## 2021-01-12 DIAGNOSIS — N183 Chronic kidney disease, stage 3 unspecified: Secondary | ICD-10-CM | POA: Diagnosis not present

## 2021-01-12 DIAGNOSIS — I5033 Acute on chronic diastolic (congestive) heart failure: Secondary | ICD-10-CM | POA: Diagnosis not present

## 2021-01-12 DIAGNOSIS — E1140 Type 2 diabetes mellitus with diabetic neuropathy, unspecified: Secondary | ICD-10-CM | POA: Diagnosis not present

## 2021-01-12 DIAGNOSIS — E876 Hypokalemia: Secondary | ICD-10-CM | POA: Diagnosis not present

## 2021-01-12 DIAGNOSIS — G4733 Obstructive sleep apnea (adult) (pediatric): Secondary | ICD-10-CM | POA: Diagnosis not present

## 2021-01-12 DIAGNOSIS — G4700 Insomnia, unspecified: Secondary | ICD-10-CM | POA: Diagnosis not present

## 2021-01-12 DIAGNOSIS — M109 Gout, unspecified: Secondary | ICD-10-CM | POA: Diagnosis not present

## 2021-01-12 DIAGNOSIS — I2510 Atherosclerotic heart disease of native coronary artery without angina pectoris: Secondary | ICD-10-CM | POA: Diagnosis not present

## 2021-01-12 DIAGNOSIS — E1122 Type 2 diabetes mellitus with diabetic chronic kidney disease: Secondary | ICD-10-CM | POA: Diagnosis not present

## 2021-01-12 DIAGNOSIS — E1169 Type 2 diabetes mellitus with other specified complication: Secondary | ICD-10-CM | POA: Diagnosis not present

## 2021-01-12 DIAGNOSIS — E785 Hyperlipidemia, unspecified: Secondary | ICD-10-CM | POA: Diagnosis not present

## 2021-01-12 DIAGNOSIS — E1151 Type 2 diabetes mellitus with diabetic peripheral angiopathy without gangrene: Secondary | ICD-10-CM | POA: Diagnosis not present

## 2021-01-12 DIAGNOSIS — R569 Unspecified convulsions: Secondary | ICD-10-CM | POA: Diagnosis not present

## 2021-01-12 DIAGNOSIS — I471 Supraventricular tachycardia: Secondary | ICD-10-CM | POA: Diagnosis not present

## 2021-01-19 DIAGNOSIS — R569 Unspecified convulsions: Secondary | ICD-10-CM | POA: Diagnosis not present

## 2021-01-19 DIAGNOSIS — M109 Gout, unspecified: Secondary | ICD-10-CM | POA: Diagnosis not present

## 2021-01-19 DIAGNOSIS — E1140 Type 2 diabetes mellitus with diabetic neuropathy, unspecified: Secondary | ICD-10-CM | POA: Diagnosis not present

## 2021-01-19 DIAGNOSIS — E1169 Type 2 diabetes mellitus with other specified complication: Secondary | ICD-10-CM | POA: Diagnosis not present

## 2021-01-19 DIAGNOSIS — N183 Chronic kidney disease, stage 3 unspecified: Secondary | ICD-10-CM | POA: Diagnosis not present

## 2021-01-19 DIAGNOSIS — I2510 Atherosclerotic heart disease of native coronary artery without angina pectoris: Secondary | ICD-10-CM | POA: Diagnosis not present

## 2021-01-19 DIAGNOSIS — I5033 Acute on chronic diastolic (congestive) heart failure: Secondary | ICD-10-CM | POA: Diagnosis not present

## 2021-01-19 DIAGNOSIS — J449 Chronic obstructive pulmonary disease, unspecified: Secondary | ICD-10-CM | POA: Diagnosis not present

## 2021-01-19 DIAGNOSIS — G4700 Insomnia, unspecified: Secondary | ICD-10-CM | POA: Diagnosis not present

## 2021-01-19 DIAGNOSIS — J9601 Acute respiratory failure with hypoxia: Secondary | ICD-10-CM | POA: Diagnosis not present

## 2021-01-19 DIAGNOSIS — E1151 Type 2 diabetes mellitus with diabetic peripheral angiopathy without gangrene: Secondary | ICD-10-CM | POA: Diagnosis not present

## 2021-01-19 DIAGNOSIS — E871 Hypo-osmolality and hyponatremia: Secondary | ICD-10-CM | POA: Diagnosis not present

## 2021-01-19 DIAGNOSIS — I471 Supraventricular tachycardia: Secondary | ICD-10-CM | POA: Diagnosis not present

## 2021-01-19 DIAGNOSIS — I872 Venous insufficiency (chronic) (peripheral): Secondary | ICD-10-CM | POA: Diagnosis not present

## 2021-01-19 DIAGNOSIS — E876 Hypokalemia: Secondary | ICD-10-CM | POA: Diagnosis not present

## 2021-01-19 DIAGNOSIS — E785 Hyperlipidemia, unspecified: Secondary | ICD-10-CM | POA: Diagnosis not present

## 2021-01-19 DIAGNOSIS — E1122 Type 2 diabetes mellitus with diabetic chronic kidney disease: Secondary | ICD-10-CM | POA: Diagnosis not present

## 2021-01-19 DIAGNOSIS — G4733 Obstructive sleep apnea (adult) (pediatric): Secondary | ICD-10-CM | POA: Diagnosis not present

## 2021-01-19 DIAGNOSIS — E1165 Type 2 diabetes mellitus with hyperglycemia: Secondary | ICD-10-CM | POA: Diagnosis not present

## 2021-01-19 DIAGNOSIS — I130 Hypertensive heart and chronic kidney disease with heart failure and stage 1 through stage 4 chronic kidney disease, or unspecified chronic kidney disease: Secondary | ICD-10-CM | POA: Diagnosis not present

## 2021-01-21 ENCOUNTER — Other Ambulatory Visit: Payer: Self-pay

## 2021-01-21 ENCOUNTER — Encounter: Payer: Self-pay | Admitting: Cardiology

## 2021-01-21 ENCOUNTER — Ambulatory Visit (HOSPITAL_COMMUNITY): Payer: Medicare Other | Attending: Cardiology

## 2021-01-21 DIAGNOSIS — I5032 Chronic diastolic (congestive) heart failure: Secondary | ICD-10-CM | POA: Diagnosis present

## 2021-01-21 DIAGNOSIS — I350 Nonrheumatic aortic (valve) stenosis: Secondary | ICD-10-CM | POA: Insufficient documentation

## 2021-01-21 DIAGNOSIS — I77810 Thoracic aortic ectasia: Secondary | ICD-10-CM | POA: Insufficient documentation

## 2021-01-21 DIAGNOSIS — I77819 Aortic ectasia, unspecified site: Secondary | ICD-10-CM | POA: Insufficient documentation

## 2021-01-21 LAB — ECHOCARDIOGRAM COMPLETE
AV Mean grad: 24 mmHg
AV Peak grad: 37.7 mmHg
Ao pk vel: 3.07 m/s
Area-P 1/2: 3.42 cm2
S' Lateral: 3.8 cm

## 2021-01-21 MED ORDER — PERFLUTREN LIPID MICROSPHERE
1.0000 mL | INTRAVENOUS | Status: AC | PRN
  Administered 2021-01-21: 3 mL via INTRAVENOUS

## 2021-01-25 ENCOUNTER — Inpatient Hospital Stay (HOSPITAL_COMMUNITY): Payer: Medicare Other

## 2021-01-25 ENCOUNTER — Emergency Department (HOSPITAL_COMMUNITY): Payer: Medicare Other

## 2021-01-25 ENCOUNTER — Other Ambulatory Visit: Payer: Self-pay

## 2021-01-25 ENCOUNTER — Encounter (HOSPITAL_COMMUNITY): Payer: Self-pay | Admitting: Emergency Medicine

## 2021-01-25 ENCOUNTER — Observation Stay (HOSPITAL_COMMUNITY)
Admission: EM | Admit: 2021-01-25 | Discharge: 2021-01-26 | Disposition: A | Discharge status: Home / Self Care | Payer: Medicare Other | Attending: Internal Medicine | Admitting: Internal Medicine

## 2021-01-25 DIAGNOSIS — I499 Cardiac arrhythmia, unspecified: Secondary | ICD-10-CM | POA: Diagnosis not present

## 2021-01-25 DIAGNOSIS — J45909 Unspecified asthma, uncomplicated: Secondary | ICD-10-CM | POA: Insufficient documentation

## 2021-01-25 DIAGNOSIS — I10 Essential (primary) hypertension: Secondary | ICD-10-CM

## 2021-01-25 DIAGNOSIS — I5032 Chronic diastolic (congestive) heart failure: Secondary | ICD-10-CM | POA: Diagnosis not present

## 2021-01-25 DIAGNOSIS — I350 Nonrheumatic aortic (valve) stenosis: Secondary | ICD-10-CM | POA: Diagnosis not present

## 2021-01-25 DIAGNOSIS — G40909 Epilepsy, unspecified, not intractable, without status epilepticus: Secondary | ICD-10-CM

## 2021-01-25 DIAGNOSIS — Z7901 Long term (current) use of anticoagulants: Secondary | ICD-10-CM | POA: Diagnosis not present

## 2021-01-25 DIAGNOSIS — Z87891 Personal history of nicotine dependence: Secondary | ICD-10-CM | POA: Diagnosis not present

## 2021-01-25 DIAGNOSIS — D72829 Elevated white blood cell count, unspecified: Secondary | ICD-10-CM | POA: Diagnosis not present

## 2021-01-25 DIAGNOSIS — R4781 Slurred speech: Secondary | ICD-10-CM | POA: Diagnosis present

## 2021-01-25 DIAGNOSIS — Z20822 Contact with and (suspected) exposure to covid-19: Secondary | ICD-10-CM | POA: Diagnosis not present

## 2021-01-25 DIAGNOSIS — I5033 Acute on chronic diastolic (congestive) heart failure: Secondary | ICD-10-CM | POA: Insufficient documentation

## 2021-01-25 DIAGNOSIS — E1169 Type 2 diabetes mellitus with other specified complication: Secondary | ICD-10-CM

## 2021-01-25 DIAGNOSIS — R079 Chest pain, unspecified: Secondary | ICD-10-CM | POA: Diagnosis not present

## 2021-01-25 DIAGNOSIS — G459 Transient cerebral ischemic attack, unspecified: Secondary | ICD-10-CM | POA: Diagnosis not present

## 2021-01-25 DIAGNOSIS — E1142 Type 2 diabetes mellitus with diabetic polyneuropathy: Secondary | ICD-10-CM

## 2021-01-25 DIAGNOSIS — Y900 Blood alcohol level of less than 20 mg/100 ml: Secondary | ICD-10-CM | POA: Insufficient documentation

## 2021-01-25 DIAGNOSIS — R21 Rash and other nonspecific skin eruption: Secondary | ICD-10-CM | POA: Diagnosis not present

## 2021-01-25 DIAGNOSIS — E119 Type 2 diabetes mellitus without complications: Secondary | ICD-10-CM | POA: Diagnosis not present

## 2021-01-25 DIAGNOSIS — G4733 Obstructive sleep apnea (adult) (pediatric): Secondary | ICD-10-CM | POA: Diagnosis not present

## 2021-01-25 DIAGNOSIS — R531 Weakness: Principal | ICD-10-CM

## 2021-01-25 DIAGNOSIS — I639 Cerebral infarction, unspecified: Secondary | ICD-10-CM

## 2021-01-25 DIAGNOSIS — Z7984 Long term (current) use of oral hypoglycemic drugs: Secondary | ICD-10-CM | POA: Diagnosis not present

## 2021-01-25 DIAGNOSIS — Z794 Long term (current) use of insulin: Secondary | ICD-10-CM | POA: Diagnosis not present

## 2021-01-25 DIAGNOSIS — R519 Headache, unspecified: Secondary | ICD-10-CM | POA: Diagnosis not present

## 2021-01-25 DIAGNOSIS — R569 Unspecified convulsions: Secondary | ICD-10-CM

## 2021-01-25 DIAGNOSIS — E785 Hyperlipidemia, unspecified: Secondary | ICD-10-CM | POA: Diagnosis not present

## 2021-01-25 DIAGNOSIS — G4700 Insomnia, unspecified: Secondary | ICD-10-CM | POA: Diagnosis not present

## 2021-01-25 DIAGNOSIS — I480 Paroxysmal atrial fibrillation: Secondary | ICD-10-CM | POA: Diagnosis not present

## 2021-01-25 DIAGNOSIS — I2510 Atherosclerotic heart disease of native coronary artery without angina pectoris: Secondary | ICD-10-CM | POA: Insufficient documentation

## 2021-01-25 DIAGNOSIS — J449 Chronic obstructive pulmonary disease, unspecified: Secondary | ICD-10-CM | POA: Diagnosis not present

## 2021-01-25 DIAGNOSIS — I517 Cardiomegaly: Secondary | ICD-10-CM | POA: Diagnosis not present

## 2021-01-25 DIAGNOSIS — R0789 Other chest pain: Secondary | ICD-10-CM | POA: Diagnosis not present

## 2021-01-25 DIAGNOSIS — J9811 Atelectasis: Secondary | ICD-10-CM | POA: Diagnosis not present

## 2021-01-25 DIAGNOSIS — I6523 Occlusion and stenosis of bilateral carotid arteries: Secondary | ICD-10-CM | POA: Diagnosis not present

## 2021-01-25 DIAGNOSIS — Z96652 Presence of left artificial knee joint: Secondary | ICD-10-CM | POA: Insufficient documentation

## 2021-01-25 DIAGNOSIS — Z743 Need for continuous supervision: Secondary | ICD-10-CM | POA: Diagnosis not present

## 2021-01-25 DIAGNOSIS — E1165 Type 2 diabetes mellitus with hyperglycemia: Secondary | ICD-10-CM | POA: Diagnosis not present

## 2021-01-25 DIAGNOSIS — Z79899 Other long term (current) drug therapy: Secondary | ICD-10-CM | POA: Insufficient documentation

## 2021-01-25 DIAGNOSIS — J189 Pneumonia, unspecified organism: Secondary | ICD-10-CM

## 2021-01-25 DIAGNOSIS — M1990 Unspecified osteoarthritis, unspecified site: Secondary | ICD-10-CM | POA: Diagnosis not present

## 2021-01-25 LAB — DIFFERENTIAL
Abs Immature Granulocytes: 0.09 10*3/uL — ABNORMAL HIGH (ref 0.00–0.07)
Basophils Absolute: 0.1 10*3/uL (ref 0.0–0.1)
Basophils Relative: 1 %
Eosinophils Absolute: 0.3 10*3/uL (ref 0.0–0.5)
Eosinophils Relative: 3 %
Immature Granulocytes: 1 %
Lymphocytes Relative: 14 %
Lymphs Abs: 1.9 10*3/uL (ref 0.7–4.0)
Monocytes Absolute: 0.7 10*3/uL (ref 0.1–1.0)
Monocytes Relative: 6 %
Neutro Abs: 10 10*3/uL — ABNORMAL HIGH (ref 1.7–7.7)
Neutrophils Relative %: 75 %

## 2021-01-25 LAB — COMPREHENSIVE METABOLIC PANEL
ALT: 23 U/L (ref 0–44)
AST: 19 U/L (ref 15–41)
Albumin: 4.1 g/dL (ref 3.5–5.0)
Alkaline Phosphatase: 100 U/L (ref 38–126)
Anion gap: 13 (ref 5–15)
BUN: 18 mg/dL (ref 8–23)
CO2: 29 mmol/L (ref 22–32)
Calcium: 9.8 mg/dL (ref 8.9–10.3)
Chloride: 95 mmol/L — ABNORMAL LOW (ref 98–111)
Creatinine, Ser: 1.01 mg/dL (ref 0.61–1.24)
GFR, Estimated: >60 mL/min (ref >60)
Glucose, Bld: 214 mg/dL — ABNORMAL HIGH (ref 70–99)
Potassium: 3.7 mmol/L (ref 3.5–5.1)
Sodium: 137 mmol/L (ref 135–145)
Total Bilirubin: 0.8 mg/dL (ref 0.3–1.2)
Total Protein: 8.1 g/dL (ref 6.5–8.1)

## 2021-01-25 LAB — I-STAT CHEM 8, ED
BUN: 20 mg/dL (ref 8–23)
Calcium, Ion: 1.12 mmol/L — ABNORMAL LOW (ref 1.15–1.40)
Chloride: 98 mmol/L (ref 98–111)
Creatinine, Ser: 0.9 mg/dL (ref 0.61–1.24)
Glucose, Bld: 214 mg/dL — ABNORMAL HIGH (ref 70–99)
HCT: 44 % (ref 39.0–52.0)
Hemoglobin: 15 g/dL (ref 13.0–17.0)
Potassium: 3.7 mmol/L (ref 3.5–5.1)
Sodium: 139 mmol/L (ref 135–145)
TCO2: 30 mmol/L (ref 22–32)

## 2021-01-25 LAB — PROTIME-INR
INR: 1 (ref 0.8–1.2)
Prothrombin Time: 13.1 seconds (ref 11.4–15.2)

## 2021-01-25 LAB — URINALYSIS, ROUTINE W REFLEX MICROSCOPIC
Bacteria, UA: NONE SEEN
Bilirubin Urine: NEGATIVE
Glucose, UA: >=500 mg/dL — AB
Hgb urine dipstick: NEGATIVE
Ketones, ur: NEGATIVE mg/dL
Leukocytes,Ua: NEGATIVE
Nitrite: NEGATIVE
Protein, ur: NEGATIVE mg/dL
Specific Gravity, Urine: 1.015 (ref 1.005–1.030)
pH: 7 (ref 5.0–8.0)

## 2021-01-25 LAB — RESP PANEL BY RT-PCR (FLU A&B, COVID) ARPGX2
Influenza A by PCR: NEGATIVE
Influenza B by PCR: NEGATIVE
SARS Coronavirus 2 by RT PCR: NEGATIVE

## 2021-01-25 LAB — CBC
HCT: 44.3 % (ref 39.0–52.0)
Hemoglobin: 14.3 g/dL (ref 13.0–17.0)
MCH: 29.5 pg (ref 26.0–34.0)
MCHC: 32.3 g/dL (ref 30.0–36.0)
MCV: 91.5 fL (ref 80.0–100.0)
Platelets: 324 10*3/uL (ref 150–400)
RBC: 4.84 MIL/uL (ref 4.22–5.81)
RDW: 15.6 % — ABNORMAL HIGH (ref 11.5–15.5)
WBC: 13.1 10*3/uL — ABNORMAL HIGH (ref 4.0–10.5)
nRBC: 0 % (ref 0.0–0.2)

## 2021-01-25 LAB — RAPID URINE DRUG SCREEN, HOSP PERFORMED
Amphetamines: NOT DETECTED
Barbiturates: NOT DETECTED
Benzodiazepines: POSITIVE — AB
Cocaine: NOT DETECTED
Opiates: NOT DETECTED
Tetrahydrocannabinol: NOT DETECTED

## 2021-01-25 LAB — ETHANOL: Alcohol, Ethyl (B): <10 mg/dL (ref <10)

## 2021-01-25 LAB — C-REACTIVE PROTEIN: CRP: 1.4 mg/dL — ABNORMAL HIGH (ref <1.0)

## 2021-01-25 LAB — APTT: aPTT: 25 seconds (ref 24–36)

## 2021-01-25 LAB — HIV ANTIBODY (ROUTINE TESTING W REFLEX): HIV Screen 4th Generation wRfx: NONREACTIVE

## 2021-01-25 LAB — PROCALCITONIN: Procalcitonin: <0.1 ng/mL

## 2021-01-25 LAB — CBG MONITORING, ED
Glucose-Capillary: 198 mg/dL — ABNORMAL HIGH (ref 70–99)
Glucose-Capillary: 185 mg/dL — ABNORMAL HIGH (ref 70–99)

## 2021-01-25 MED ORDER — ASPIRIN EC 81 MG PO TBEC
81.0000 mg | DELAYED_RELEASE_TABLET | Freq: Every day | ORAL | Status: DC
  Administered 2021-01-26: 81 mg via ORAL
  Filled 2021-01-25: qty 1

## 2021-01-25 MED ORDER — INSULIN GLARGINE 100 UNIT/ML ~~LOC~~ SOLN
12.0000 [IU] | Freq: Every day | SUBCUTANEOUS | Status: DC
  Administered 2021-01-26: 12 [IU] via SUBCUTANEOUS
  Filled 2021-01-25: qty 0.12

## 2021-01-25 MED ORDER — ENOXAPARIN SODIUM 40 MG/0.4ML IJ SOSY
40.0000 mg | PREFILLED_SYRINGE | INTRAMUSCULAR | Status: DC
  Administered 2021-01-26: 40 mg via SUBCUTANEOUS
  Filled 2021-01-25 (×2): qty 0.4

## 2021-01-25 MED ORDER — OXYCODONE HCL 5 MG PO TABS
5.0000 mg | ORAL_TABLET | Freq: Three times a day (TID) | ORAL | Status: DC | PRN
  Administered 2021-01-26 (×3): 5 mg via ORAL
  Filled 2021-01-25 (×3): qty 1

## 2021-01-25 MED ORDER — FLUTICASONE PROPIONATE 50 MCG/ACT NA SUSP
2.0000 | Freq: Every day | NASAL | Status: DC
  Administered 2021-01-26: 2 via NASAL
  Filled 2021-01-25: qty 16

## 2021-01-25 MED ORDER — ATORVASTATIN CALCIUM 40 MG PO TABS
40.0000 mg | ORAL_TABLET | Freq: Every evening | ORAL | Status: DC
  Administered 2021-01-26 (×2): 40 mg via ORAL
  Filled 2021-01-25 (×2): qty 1

## 2021-01-25 MED ORDER — IOHEXOL 350 MG/ML SOLN
100.0000 mL | Freq: Once | INTRAVENOUS | Status: AC | PRN
  Administered 2021-01-25: 100 mL via INTRAVENOUS

## 2021-01-25 MED ORDER — FERROUS SULFATE 325 (65 FE) MG PO TABS
324.0000 mg | ORAL_TABLET | Freq: Every day | ORAL | Status: DC
  Filled 2021-01-25: qty 1

## 2021-01-25 MED ORDER — BUSPIRONE HCL 10 MG PO TABS
5.0000 mg | ORAL_TABLET | Freq: Three times a day (TID) | ORAL | Status: DC
  Administered 2021-01-26 (×3): 5 mg via ORAL
  Filled 2021-01-25 (×3): qty 1

## 2021-01-25 MED ORDER — ACETAMINOPHEN 325 MG PO TABS
650.0000 mg | ORAL_TABLET | ORAL | Status: DC | PRN
  Administered 2021-01-26 (×2): 650 mg via ORAL
  Filled 2021-01-25 (×2): qty 2

## 2021-01-25 MED ORDER — TORSEMIDE 20 MG PO TABS
20.0000 mg | ORAL_TABLET | Freq: Every day | ORAL | Status: DC
  Filled 2021-01-25: qty 1

## 2021-01-25 MED ORDER — INSULIN ASPART 100 UNIT/ML IJ SOLN
0.0000 [IU] | Freq: Three times a day (TID) | INTRAMUSCULAR | Status: DC
  Administered 2021-01-25: 3 [IU] via SUBCUTANEOUS
  Administered 2021-01-26: 4 [IU] via SUBCUTANEOUS
  Administered 2021-01-26: 7 [IU] via SUBCUTANEOUS

## 2021-01-25 MED ORDER — SPIRONOLACTONE 25 MG PO TABS
25.0000 mg | ORAL_TABLET | Freq: Every day | ORAL | Status: DC
  Filled 2021-01-25: qty 1

## 2021-01-25 MED ORDER — ALLOPURINOL 100 MG PO TABS
100.0000 mg | ORAL_TABLET | Freq: Two times a day (BID) | ORAL | Status: DC
  Administered 2021-01-26 (×2): 100 mg via ORAL
  Filled 2021-01-25 (×3): qty 1

## 2021-01-25 MED ORDER — LORAZEPAM 2 MG/ML IJ SOLN
1.0000 mg | INTRAMUSCULAR | Status: AC
  Administered 2021-01-25: 1 mg via INTRAVENOUS
  Filled 2021-01-25: qty 1

## 2021-01-25 MED ORDER — STROKE: EARLY STAGES OF RECOVERY BOOK: Freq: Once | Status: DC

## 2021-01-25 MED ORDER — METOPROLOL SUCCINATE ER 25 MG PO TB24: 25.0000 mg | ORAL_TABLET | Freq: Every day | ORAL | Status: DC

## 2021-01-25 MED ORDER — VENLAFAXINE HCL ER 75 MG PO CP24: 225.0000 mg | ORAL_CAPSULE | Freq: Every day | ORAL | Status: DC

## 2021-01-25 MED ORDER — HYDRALAZINE HCL 20 MG/ML IJ SOLN: 10.0000 mg | Freq: Four times a day (QID) | INTRAMUSCULAR | Status: DC | PRN

## 2021-01-25 MED ORDER — TEMAZEPAM 15 MG PO CAPS
15.0000 mg | ORAL_CAPSULE | Freq: Every day | ORAL | Status: DC
  Administered 2021-01-26: 15 mg via ORAL
  Filled 2021-01-25: qty 1
  Filled 2021-01-25: qty 2

## 2021-01-25 MED ORDER — TRAZODONE HCL 100 MG PO TABS
200.0000 mg | ORAL_TABLET | Freq: Every day | ORAL | Status: DC
  Administered 2021-01-26: 200 mg via ORAL
  Filled 2021-01-25: qty 4

## 2021-01-25 MED ORDER — PRAZOSIN HCL 2 MG PO CAPS
4.0000 mg | ORAL_CAPSULE | Freq: Every day | ORAL | Status: DC
  Administered 2021-01-26: 4 mg via ORAL
  Filled 2021-01-25 (×3): qty 2

## 2021-01-25 MED ORDER — ACETAMINOPHEN 160 MG/5ML PO SOLN: 650.0000 mg | ORAL | Status: DC | PRN

## 2021-01-25 MED ORDER — ALBUTEROL SULFATE (2.5 MG/3ML) 0.083% IN NEBU: 3.0000 mL | INHALATION_SOLUTION | Freq: Four times a day (QID) | RESPIRATORY_TRACT | Status: DC | PRN

## 2021-01-25 MED ORDER — ACETAMINOPHEN 650 MG RE SUPP: 650.0000 mg | RECTAL | Status: DC | PRN

## 2021-01-25 MED ORDER — TORSEMIDE 20 MG PO TABS
40.0000 mg | ORAL_TABLET | Freq: Every day | ORAL | Status: DC
  Administered 2021-01-26: 40 mg via ORAL
  Filled 2021-01-25: qty 2

## 2021-01-25 MED ORDER — ARIPIPRAZOLE 10 MG PO TABS
5.0000 mg | ORAL_TABLET | Freq: Every day | ORAL | Status: DC
  Administered 2021-01-26: 5 mg via ORAL
  Filled 2021-01-25 (×2): qty 1

## 2021-01-25 MED ORDER — LORATADINE 10 MG PO TABS
10.0000 mg | ORAL_TABLET | Freq: Every day | ORAL | Status: DC
  Administered 2021-01-26: 10 mg via ORAL
  Filled 2021-01-25: qty 1

## 2021-01-25 MED ORDER — POLYETHYLENE GLYCOL 3350 17 G PO PACK: 17.0000 g | PACK | Freq: Every day | ORAL | Status: DC | PRN

## 2021-01-25 MED ORDER — PREGABALIN 100 MG PO CAPS
100.0000 mg | ORAL_CAPSULE | Freq: Three times a day (TID) | ORAL | Status: DC
  Administered 2021-01-26 (×3): 100 mg via ORAL
  Filled 2021-01-25 (×3): qty 1

## 2021-01-25 MED ORDER — ISOSORBIDE MONONITRATE ER 30 MG PO TB24: 30.0000 mg | ORAL_TABLET | Freq: Every day | ORAL | Status: DC

## 2021-01-25 MED ORDER — ONDANSETRON HCL 4 MG/2ML IJ SOLN: 4.0000 mg | Freq: Four times a day (QID) | INTRAMUSCULAR | Status: DC | PRN

## 2021-01-25 MED ORDER — PANTOPRAZOLE SODIUM 40 MG PO TBEC
40.0000 mg | DELAYED_RELEASE_TABLET | Freq: Every day | ORAL | Status: DC
  Administered 2021-01-26: 40 mg via ORAL
  Filled 2021-01-25: qty 1

## 2021-01-25 MED ORDER — POTASSIUM CHLORIDE CRYS ER 20 MEQ PO TBCR
40.0000 meq | EXTENDED_RELEASE_TABLET | Freq: Every day | ORAL | Status: DC
  Administered 2021-01-26 (×2): 40 meq via ORAL
  Filled 2021-01-25 (×2): qty 2

## 2021-01-25 NOTE — ED Notes (Signed)
[facility] called to activate per RN Caitlynn and PA Phylliss Bob. Spoke with Phil at [facility] [employer]

## 2021-01-25 NOTE — H&P (Signed)
History and Physical    Lawrence [name] VCB:[id number] DOB: Nov 16, 1955 DOA: 01/25/2021  PCP: [name], PA-C  Patient coming from: Home via EMS   Chief Complaint:  Chief Complaint  Patient presents with   Code Stroke     HPI:    65 year old male with past medical history of insulin-dependent diabetes mellitus type 2, hypertension, COPD, diastolic congestive heart failure (Echo 01/2021 EF 60-65% with G2DD), paroxysmal atrial fibrillation, depression, moderate aortic stenosis, obstructive sleep apnea on BiPAP therapy, gout, hyperlipidemia to [facility] emergency department with complaints of intermittent slurred speech and now left-sided weakness.  History is been obtained from both the patient as well as his wife who is at the bedside.  Patient has been experiencing episodes of slurred speech for approximately the past 48 hours.    Episodes are associated with intermittent headaches the patient has denied associated focal weakness, slurred speech, facial droop or visual changes.  Wife does state the patient seems somewhat confused during some of these episodes of slurred speech.  Patient had a discussion with his cardiologist Dr. Radford Pax at approximately 4 PM this afternoon and when Dr. Radford Pax was notified that the patient was having episodes of slurred speech he was instructed to call EMS.  Upon EMS arrival, patient's most recent episode of slurred speech had resolved.  Patient was promptly brought into [facility] emergency department for evaluation.   Upon evaluation in the emergency department, shortly after arrival patient began to complain of left-sided weakness.  Code stroke was then called and Dr. Lorrin Goodell with neurology promptly evaluated the patient and recommended a complete stroke work-up as well as holding patient's Eliquis.  The hospitalist group was then called to assess the patient for admission to the hospital.  Review of Systems:   Review of  Systems  Neurological:  Positive for focal weakness.  All other systems reviewed and are negative.  Past Medical History:  Diagnosis Date   Acquired dilation of ascending aorta and aortic root ([place])    57mm by echo 01/2021   Acute on chronic diastolic CHF (congestive heart failure) ([place]) 01/13/2018   Acute respiratory failure with hypoxia ([place]) 01/13/2018   Adenomatous colon polyp 2007   Anasarca 01/31/2017   Anemia    Anxiety    Aortic stenosis    moderate AS by echo 01/2021   Arthritis    Asthma    Bilateral lower extremity edema 01/31/2017   Bilateral lower leg cellulitis    BPH without obstruction/lower urinary tract symptoms 02/22/2017   Cellulitis 11/07/2017   Cellulitis of lower extremity    Chest pain    Chronic cough    Chronic diastolic CHF (congestive heart failure) (HCC)    Chronic venous stasis 03/07/2019   COPD (chronic obstructive pulmonary disease) (HCC)    Coronary artery calcification seen on CAT scan    Depression    Diabetic neuropathy ([place]) 09/11/2019   Hematuria 02/22/2017   History of anemia 12/26/2018   History of cardiac cath    a.  01/2018 with angiographically minimal coronary disease with  up to 40% prox LAD and 20% mid LCx and OM2 with normal LVF and normal LVEDP.[name]   History of colon polyps 08/24/2018   Hypertension    Leukocytosis 08/18/2018   Morbid obesity ([place])    New onset type 2 diabetes mellitus ([place]) 11/08/2017   OSA (obstructive sleep apnea)    Pain due to onychomycosis of toenails of both feet 09/11/2019   Peripheral neuropathy 02/22/2017  Physical deconditioning 02/22/2017   Primary osteoarthritis, left shoulder 03/05/2017   PTSD (post-traumatic stress disorder)    Pure hypercholesterolemia    QT prolongation 03/07/2019   Right foot pain 01/13/2018   Shortness of breath    Sinus tachycardia 03/07/2019   Sleep apnea    CPAP   Type 2 diabetes mellitus with vascular disease ([place]) 09/11/2019    Past Surgical History:   Procedure Laterality Date   ENDOVENOUS ABLATION SAPHENOUS VEIN W/ LASER Right 08/20/2020   endovenous laser ablation right greater saphenous vein by Gae Gallop MD    JOINT REPLACEMENT     left knee replacement x 2   KNEE ARTHROSCOPY Bilateral    LEFT HEART CATH AND CORONARY ANGIOGRAPHY N/A 01/17/2018   Procedure: LEFT HEART CATH AND CORONARY ANGIOGRAPHY;  Surgeon: Leonie Man, MD;  Location: [place] [facility];  Service: Cardiovascular;  Laterality: N/A;   UMBILICAL HERNIA REPAIR       reports that he quit smoking about 4 years ago. He has never used smokeless tobacco. He reports previous alcohol use. He reports that he does not use drugs.  Allergies  Allergen Reactions   Vancomycin Other (See Comments)    "Red Man Syndrome" 02/02/17: possible cause for rash under both arms   Niacin Other (See Comments)   Niacin And Related Other (See Comments)    Red man syndrome   Tubersol [Tuberculin] Other (See Comments)    Reaction unknown   Doxycycline Rash and Other (See Comments)    Family History  Problem Relation Age of Onset   CAD Maternal Grandfather    Diabetes Other    Diabetes Mellitus II Neg Hx    Colon cancer Neg Hx    Esophageal cancer Neg Hx    Inflammatory bowel disease Neg Hx    Liver disease Neg Hx    Pancreatic cancer Neg Hx    Rectal cancer Neg Hx    Stomach cancer Neg Hx      Prior to Admission medications   Medication Sig Start Date End Date Taking? Authorizing Provider  Accu-Chek FastClix Lancets MISC 1 each by Other route in the morning and at bedtime. 12/22/20  Yes [provider]  acetaminophen (TYLENOL) 650 MG CR tablet Take 1,300 mg by mouth every 8 (eight) hours as needed for pain.   Yes [provider]  albuterol (VENTOLIN HFA) 108 (90 Base) MCG/ACT inhaler Inhale 2 puffs into the lungs every 6 (six) hours as needed for wheezing or shortness of breath. 03/10/20  Yes [provider]  Alcohol Swabs (ALCOHOL PREP) 70 % PADS 1  each by Other route in the morning, at noon, and at bedtime. 12/22/20  Yes [provider]  allopurinol (ZYLOPRIM) 100 MG tablet Take 100 mg by mouth 2 (two) times daily.   Yes [provider]  apixaban (ELIQUIS) 5 MG TABS tablet Take 1 tablet (5 mg total) by mouth 2 (two) times daily. 01/07/21  Yes Turner, Eber Hong, MD  ARIPiprazole (ABILIFY) 5 MG tablet Take 1 tablet (5 mg total) by mouth at bedtime. 01/08/21  Yes Mozingo, Berdie Ogren, NP  atorvastatin (LIPITOR) 40 MG tablet TAKE ONE TABLET BY MOUTH DAILY AT 6PM Patient taking differently: Take 40 mg by mouth every evening. 01/07/21  Yes Turner, Eber Hong, MD  B-D ULTRAFINE III SHORT PEN 31G X 8 MM MISC 1 each by Other route See admin instructions. Use to inject insulin subcutaneously at bedtime as directed. 11/20/20  Yes [provider]  blood glucose meter kit and supplies Dispense based on patient and insurance preference. Use up to four times daily as directed. (FOR ICD-10 E10.9, E11.9). Patient taking differently: 1 each by Other route See admin instructions. Dispense based on patient and insurance preference. Use up to four times daily as directed. (FOR ICD-10 E10.9, E11.9). 11/09/17  Yes Eugenie Filler, MD  busPIRone (BUSPAR) 5 MG tablet Take 1 tablet (5 mg total) by mouth 3 (three) times daily. 12/10/20  Yes Mozingo, Berdie Ogren, NP  cetirizine (ZYRTEC) 10 MG tablet Take 10 mg by mouth at bedtime.    Yes [provider]  dapagliflozin propanediol (FARXIGA) 10 MG TABS tablet Take 1 tablet (10 mg total) by mouth daily before breakfast. 01/05/21  Yes Renato Shin, MD  Dulaglutide (TRULICITY) 4.5 MB/8.4YK SOPN Inject 4.5 mg as directed once a week. 11/19/20  Yes Renato Shin, MD  ferrous sulfate 324 (65 Fe) MG TBEC Take 324 mg by mouth daily with breakfast.    Yes [provider]  fluticasone (FLONASE) 50 MCG/ACT nasal spray Place 2 sprays into both nostrils daily.    Yes [provider]   furosemide (LASIX) 80 MG tablet Take 80-160 mg by mouth in the morning and at bedtime. 10/28/20  Yes [provider]  isosorbide mononitrate (IMDUR) 30 MG 24 hr tablet Take 1 tablet (30 mg total) by mouth daily. 01/07/21  Yes Turner, Eber Hong, MD  LANTUS SOLOSTAR 100 UNIT/ML Solostar Pen Inject 12 Units into the skin every morning. 11/19/20  Yes Renato Shin, MD  metFORMIN (GLUCOPHAGE) 500 MG tablet Take 1,000 mg by mouth 2 (two) times daily with a meal.   Yes [provider]  metoprolol succinate (TOPROL-XL) 25 MG 24 hr tablet Take 1 tablet (25 mg total) by mouth daily. 01/07/21  Yes Turner, Eber Hong, MD  omeprazole (PRILOSEC) 20 MG capsule Take 20 mg by mouth every morning. 05/29/20  Yes [provider]  oxyCODONE (OXY IR/ROXICODONE) 5 MG immediate release tablet Take 5 mg by mouth every 8 (eight) hours as needed for severe pain. 09/17/20  Yes [provider]  potassium chloride SA (KLOR-CON) 20 MEQ tablet Take 2 tablets (40 mEq total) by mouth daily. 01/07/21  Yes Turner, Eber Hong, MD  prazosin (MINIPRESS) 2 MG capsule Take 2 capsules (4 mg total) by mouth at bedtime. 01/07/21  Yes Turner, Eber Hong, MD  pregabalin (LYRICA) 100 MG capsule Take 100 mg by mouth in the morning, at noon, and at bedtime.  07/14/19  Yes [provider]  silver sulfADIAZINE (SILVADENE) 1 % cream Apply 1 application topically See admin instructions. Apply as directed once a day as directed to bilateral legs   Yes [provider]  spironolactone (ALDACTONE) 25 MG tablet Take 1 tablet (25 mg total) by mouth daily. 01/07/21  Yes Turner, Eber Hong, MD  temazepam (RESTORIL) 15 MG capsule Take 1 capsule (15 mg total) by mouth at bedtime. 12/22/20  Yes Mozingo, Berdie Ogren, NP  torsemide (DEMADEX) 20 MG tablet Take 2 tablets (40 mg total) in the morning and 1 tablet (20 mg total) in the evening. 01/07/21  Yes Turner, Eber Hong, MD  traZODone (DESYREL) 100 MG tablet Take 2 tablets (200 mg total)  by mouth at bedtime. 12/31/20  Yes Mozingo, Berdie Ogren, NP  venlafaxine XR (EFFEXOR-XR) 75 MG 24 hr capsule Take 3 capsules (225 mg total) by mouth daily with breakfast. 12/10/20  Yes Mozingo, Berdie Ogren, NP  Vitamin D, Ergocalciferol, (  DRISDOL) 1.25 MG (50000 UNIT) CAPS capsule Take 50,000 Units by mouth every 7 (seven) days.   Yes [provider]    Physical Exam: Vitals:   01/25/21 1819 01/25/21 1821 01/25/21 2030 01/25/21 2100  BP: 116/65  134/80 128/82  Pulse: 87  68 (!) 108  Resp: 18  (!) 24 (!) 21  Temp: 98.4 F (36.9 C)     TempSrc: Oral     SpO2: 97%  95% 90%  Weight:  (!) 176 kg    Height:  $Remove'6\' 2"'imULkzl$  (1.88 m)      Constitutional: Awake alert and oriented x3, no associated distress.  Patient is obese Skin: Notable hyperemia of the anterior surfaces of the bilateral lower extremities.  no rashes, no lesions, good skin turgor noted. Eyes: Pupils are equally reactive to light.  No evidence of scleral icterus or conjunctival pallor.  ENMT: Dry mucous membranes noted.  Posterior pharynx clear of any exudate or lesions.   Neck: normal, supple, no masses, no thyromegaly.  No evidence of jugular venous distension.   Respiratory: clear to auscultation bilaterally, no wheezing, no crackles. Normal respiratory effort. No accessory muscle use.  Cardiovascular: Regular rate and rhythm, no murmurs / rubs / gallops. No extremity edema. 2+ pedal pulses. No carotid bruits.  Chest:   Nontender without crepitus or deformity.   Back:   Nontender without crepitus or deformity. Abdomen: Abdomen is protuberant but soft and nontender.  No evidence of intra-abdominal masses.  Positive bowel sounds noted in all quadrants.   Musculoskeletal: No joint deformity upper and lower extremities. Good ROM, no contractures. Normal muscle tone.  Neurologic: CN 2-12 grossly intact.  Very mild weakness of the left upper and left lower extremity with proximal distal muscle groups.  Patient is also  experiencing diminished sensation to the left upper and lower extremities well.  Patient is following all commands.  Patient is responsive to verbal stimuli.   Psychiatric: Patient exhibits normal mood with appropriate affect.  Patient seems to possess insight as to their current situation.     Labs on Admission: I have personally reviewed following labs and imaging studies -   CBC: Recent Labs  Lab 01/25/21 1819 01/25/21 1834  WBC 13.1*  --   NEUTROABS 10.0*  --   HGB 14.3 15.0  HCT 44.3 44.0  MCV 91.5  --   PLT 324  --    Basic Metabolic Panel: Recent Labs  Lab 01/25/21 1819 01/25/21 1834  NA 137 139  K 3.7 3.7  CL 95* 98  CO2 29  --   GLUCOSE 214* 214*  BUN 18 20  CREATININE 1.01 0.90  CALCIUM 9.8  --    GFR: Estimated Creatinine Clearance: 142.2 mL/min (by C-G formula based on SCr of 0.9 mg/dL). Liver Function Tests: Recent Labs  Lab 01/25/21 1819  AST 19  ALT 23  ALKPHOS 100  BILITOT 0.8  PROT 8.1  ALBUMIN 4.1   No results for input(s): LIPASE, AMYLASE in the last 168 hours. No results for input(s): AMMONIA in the last 168 hours. Coagulation Profile: Recent Labs  Lab 01/25/21 1819  INR 1.0   Cardiac Enzymes: No results for input(s): CKTOTAL, CKMB, CKMBINDEX, TROPONINI in the last 168 hours. BNP (last 3 results) No results for input(s): PROBNP in the last 8760 hours. HbA1C: No results for input(s): HGBA1C in the last 72 hours. CBG: Recent Labs  Lab 01/25/21 1819 01/25/21 2142  GLUCAP 198* 185*   Lipid Profile: No results for  input(s): CHOL, HDL, LDLCALC, TRIG, CHOLHDL, LDLDIRECT in the last 72 hours. Thyroid Function Tests: No results for input(s): TSH, T4TOTAL, FREET4, T3FREE, THYROIDAB in the last 72 hours. Anemia Panel: No results for input(s): VITAMINB12, FOLATE, FERRITIN, TIBC, IRON, RETICCTPCT in the last 72 hours. Urine analysis:    Component Value Date/Time   COLORURINE STRAW (A) 01/25/2021 2000   APPEARANCEUR CLEAR 01/25/2021  2000   LABSPEC 1.015 01/25/2021 2000   PHURINE 7.0 01/25/2021 2000   GLUCOSEU >=500 (A) 01/25/2021 2000   HGBUR NEGATIVE 01/25/2021 2000   BILIRUBINUR NEGATIVE 01/25/2021 2000   KETONESUR NEGATIVE 01/25/2021 2000   PROTEINUR NEGATIVE 01/25/2021 2000   NITRITE NEGATIVE 01/25/2021 2000   LEUKOCYTESUR NEGATIVE 01/25/2021 2000    Radiological Exams on Admission - Personally Reviewed: DG Chest 1 View  Result Date: 01/25/2021 CLINICAL DATA:  64 year old male with concern for pneumonia. EXAM: CHEST  1 VIEW COMPARISON:  Chest radiograph dated 11/13/2020. FINDINGS: Minimal bibasilar atelectasis. No focal consolidation, pleural effusion, pneumothorax. The cardiomegaly. No acute osseous pathology. IMPRESSION: No active cardiopulmonary disease. Electronically Signed   By: Anner Crete M.D.   On: 01/25/2021 20:54   CT CEREBRAL PERFUSION W CONTRAST  Result Date: 01/25/2021 CLINICAL DATA:  Left leg weakness EXAM: CT ANGIOGRAPHY HEAD AND NECK CT PERFUSION BRAIN TECHNIQUE: Multidetector CT imaging of the head and neck was performed using the standard protocol during bolus administration of intravenous contrast. Multiplanar CT image reconstructions and MIPs were obtained to evaluate the vascular anatomy. Carotid stenosis measurements (when applicable) are obtained utilizing NASCET criteria, using the distal internal carotid diameter as the denominator. Multiphase CT imaging of the brain was performed following IV bolus contrast injection. Subsequent parametric perfusion maps were calculated using RAPID software. CONTRAST:  135mL OMNIPAQUE IOHEXOL 350 MG/ML SOLN COMPARISON:  None. FINDINGS: CTA NECK Aortic arch: Great vessel origins are patent. There is no high-grade proximal subclavian artery stenosis. Right carotid system: Patent. Mild calcified plaque at the proximal ICA without stenosis. Retropharyngeal course. Left carotid system: Patent. Mild calcified plaque at the proximal ICA without stenosis.  Retropharyngeal course. Vertebral arteries: Patent.  Left vertebral artery is dominant. Skeleton: Degenerative changes of the cervical spine. Other neck: Unremarkable. Upper chest: No apical lung mass. Review of the MIP images confirms the above findings CTA HEAD Anterior circulation: Intracranial internal carotid arteries are patent with calcified plaque but no significant stenosis. Anterior and middle cerebral arteries are patent. Posterior circulation: Intracranial vertebral arteries are patent. Basilar artery is patent. Posterior cerebral arteries are patent. Bilateral posterior communicating arteries are present. Venous sinuses: Patent as allowed by contrast bolus timing. Review of the MIP images confirms the above findings CT Brain Perfusion Findings: CBF (<30%) Volume: 18mL Perfusion (Tmax>6.0s) volume: 68mL Mismatch Volume: 62mL Infarction Location: None. IMPRESSION: No large vessel occlusion, hemodynamically significant stenosis, or evidence of dissection. Perfusion imaging demonstrates no evidence of core infarction or penumbra. Electronically Signed   By: Macy Mis M.D.   On: 01/25/2021 19:09   CT HEAD CODE STROKE WO CONTRAST  Result Date: 01/25/2021 CLINICAL DATA:  Code stroke.  Left leg weakness EXAM: CT HEAD WITHOUT CONTRAST TECHNIQUE: Contiguous axial images were obtained from the base of the skull through the vertex without intravenous contrast. COMPARISON:  Correlation made with MRI brain March 2022 FINDINGS: Brain: No acute intracranial hemorrhage, mass effect, or edema. Gray-white differentiation remains preserved. Minimal patchy hypoattenuation in the supratentorial white matter may reflect minor chronic microvascular ischemic changes. Ventricles and sulci are within normal limits in size  and configuration. No extra-axial collection. Vascular: No hyperdense vessel. Skull: Unremarkable. Sinuses/Orbits: Paranasal sinus mucosal thickening. No acute orbital abnormality. Other: Mastoid air cells  are clear. ASPECTS ([place] Stroke Program Early CT Score) - Ganglionic level infarction (caudate, lentiform nuclei, internal capsule, insula, M1-M3 cortex): 7 - Supraganglionic infarction (M4-M6 cortex): 3 Total score (0-10 with 10 being normal): 10 IMPRESSION: There is no acute intracranial hemorrhage or evidence of acute infarction. ASPECT score is 10. These results were communicated to Dr. Lorrin Goodell at 6:49 pm on 01/25/2021 by text page via the [facility] messaging system. Electronically Signed   By: Macy Mis M.D.   On: 01/25/2021 18:50   CT ANGIO HEAD CODE STROKE  Result Date: 01/25/2021 CLINICAL DATA:  Left leg weakness EXAM: CT ANGIOGRAPHY HEAD AND NECK CT PERFUSION BRAIN TECHNIQUE: Multidetector CT imaging of the head and neck was performed using the standard protocol during bolus administration of intravenous contrast. Multiplanar CT image reconstructions and MIPs were obtained to evaluate the vascular anatomy. Carotid stenosis measurements (when applicable) are obtained utilizing NASCET criteria, using the distal internal carotid diameter as the denominator. Multiphase CT imaging of the brain was performed following IV bolus contrast injection. Subsequent parametric perfusion maps were calculated using RAPID software. CONTRAST:  131mL OMNIPAQUE IOHEXOL 350 MG/ML SOLN COMPARISON:  None. FINDINGS: CTA NECK Aortic arch: Great vessel origins are patent. There is no high-grade proximal subclavian artery stenosis. Right carotid system: Patent. Mild calcified plaque at the proximal ICA without stenosis. Retropharyngeal course. Left carotid system: Patent. Mild calcified plaque at the proximal ICA without stenosis. Retropharyngeal course. Vertebral arteries: Patent.  Left vertebral artery is dominant. Skeleton: Degenerative changes of the cervical spine. Other neck: Unremarkable. Upper chest: No apical lung mass. Review of the MIP images confirms the above findings CTA HEAD Anterior circulation: Intracranial  internal carotid arteries are patent with calcified plaque but no significant stenosis. Anterior and middle cerebral arteries are patent. Posterior circulation: Intracranial vertebral arteries are patent. Basilar artery is patent. Posterior cerebral arteries are patent. Bilateral posterior communicating arteries are present. Venous sinuses: Patent as allowed by contrast bolus timing. Review of the MIP images confirms the above findings CT Brain Perfusion Findings: CBF (<30%) Volume: 17mL Perfusion (Tmax>6.0s) volume: 62mL Mismatch Volume: 23mL Infarction Location: None. IMPRESSION: No large vessel occlusion, hemodynamically significant stenosis, or evidence of dissection. Perfusion imaging demonstrates no evidence of core infarction or penumbra. Electronically Signed   By: Macy Mis M.D.   On: 01/25/2021 19:09   CT ANGIO NECK CODE STROKE  Result Date: 01/25/2021 CLINICAL DATA:  Left leg weakness EXAM: CT ANGIOGRAPHY HEAD AND NECK CT PERFUSION BRAIN TECHNIQUE: Multidetector CT imaging of the head and neck was performed using the standard protocol during bolus administration of intravenous contrast. Multiplanar CT image reconstructions and MIPs were obtained to evaluate the vascular anatomy. Carotid stenosis measurements (when applicable) are obtained utilizing NASCET criteria, using the distal internal carotid diameter as the denominator. Multiphase CT imaging of the brain was performed following IV bolus contrast injection. Subsequent parametric perfusion maps were calculated using RAPID software. CONTRAST:  134mL OMNIPAQUE IOHEXOL 350 MG/ML SOLN COMPARISON:  None. FINDINGS: CTA NECK Aortic arch: Great vessel origins are patent. There is no high-grade proximal subclavian artery stenosis. Right carotid system: Patent. Mild calcified plaque at the proximal ICA without stenosis. Retropharyngeal course. Left carotid system: Patent. Mild calcified plaque at the proximal ICA without stenosis. Retropharyngeal course.  Vertebral arteries: Patent.  Left vertebral artery is dominant. Skeleton: Degenerative changes of the cervical  spine. Other neck: Unremarkable. Upper chest: No apical lung mass. Review of the MIP images confirms the above findings CTA HEAD Anterior circulation: Intracranial internal carotid arteries are patent with calcified plaque but no significant stenosis. Anterior and middle cerebral arteries are patent. Posterior circulation: Intracranial vertebral arteries are patent. Basilar artery is patent. Posterior cerebral arteries are patent. Bilateral posterior communicating arteries are present. Venous sinuses: Patent as allowed by contrast bolus timing. Review of the MIP images confirms the above findings CT Brain Perfusion Findings: CBF (<30%) Volume: 10mL Perfusion (Tmax>6.0s) volume: 58mL Mismatch Volume: 14mL Infarction Location: None. IMPRESSION: No large vessel occlusion, hemodynamically significant stenosis, or evidence of dissection. Perfusion imaging demonstrates no evidence of core infarction or penumbra. Electronically Signed   By: Macy Mis M.D.   On: 01/25/2021 19:09    EKG: Personally reviewed.  Rhythm is normal sinus rhythm with heart rate of 94 bpm.  Frequent PVCs, no dynamic ST segment changes appreciated.  Assessment/Plan Principal Problem:   Acute left-sided weakness  Patient exhibiting sudden onset mild left-sided weakness shortly after arrival in the emergency department. Code stroke was called and patient was promptly evaluated by Dr. Lorrin Goodell with neurology who recommends complete stroke work-up. Unclear as to whether or not patient is suffering from a TIA or stroke at this point as it is still early Holding Eliquis per neurology recommendation. Noncontrast CT of the head as well as CTA of the head and neck are unremarkable MRI brain has been ordered Serial neurologic checks Monitoring patient on telemetry PT, OT, SLP evaluation Permissive hypertension Echocardiogram in  the morning I discussed the patient's case with Dr. Leonel Ramsay with neurology.  If patient's work-up for stroke is negative he recommends increasing pregabalin dosing to 150 mg 3 times daily in case the symptoms are related to seizure activity.  Active Problems:    Partial seizure [facility])  Patient exhibiting questionable episodes of shaking during prior hospitalization in March At that time Dr. Quinn Axe with neurology recommended loading the patient on Keppra after which patient was discharged home on oral chemotherapy Patient failed to follow-up as an outpatient with neurology and stopped taking his Keppra when he ran out. Uncertain as to whether or not current neurologic presentation has to do with recurrent partial seizures however per my discussion with Dr. Leonel Ramsay he recommends that if stroke work-up ends up being negative to consider increasing pregabalin to 150 mg 3 times daily. Additionally, EEG ordered for tomorrow morning per Dr. Cecil Cobbs recommendation.   OSA (obstructive sleep apnea)  BiPAP nightly per home regimen    Essential hypertension, benign  Permissive hypertension for now As needed intravenous antihypertensives for markedly elevated blood pressure.  Chronic diastolic CHF (congestive heart failure) (HCC)  No evidence of cardiogenic volume overload Continue home regimen of diuretics, although we will monitor patient closely as patient is on ongoing diuretic use in aortic stenosis.    Leukocytosis  Notable leukocytosis on initial CBC Having said, patient is exhibiting no other SIRS criteria Obtaining urinalysis, chest x-ray, CRP, procalcitonin    Type 2 diabetes mellitus with diabetic polyneuropathy, with long-term current use of insulin (HCC)  Continuing home regimen of Lantus Accu-Cheks before every meal and nightly with sliding scale insulin Hemoglobin A1c pending Temporarily holding Farxiga and Trulicity due to tenuous oral intake    Moderate aortic  stenosis  Outpatient follow-up    AF (paroxysmal atrial fibrillation) ([place])  Continue home regimen of metoprolol for rate control Monitoring patient on telemetry Holding home regimen of Eliquis  per neurology recommendations    COPD (chronic obstructive pulmonary disease) (HCC) No evidence of COPD exacerbation at this time As needed Bronchodilator therapy for shortness of breath and wheezing.    Code Status:  DNR Family Communication: Wife is at bedside who has been updated on plan of care.  Status is: Inpatient  Remains inpatient appropriate because:Ongoing diagnostic testing needed not appropriate for outpatient work up and Inpatient level of care appropriate due to severity of illness  Dispo: The patient is from: Home              Anticipated d/c is to: Home              Patient currently is not medically stable to d/c.   Difficult to place patient No        Vernelle Emerald MD [facility] Pager [phone]  If 7PM-7AM, please contact night-coverage [email or web address] Use universal  password for that web site. If you do not have the password, please call the hospital operator.  01/25/2021, 9:46 PM

## 2021-01-25 NOTE — Telephone Encounter (Signed)
Spoke with the patient and his wife. Patient is having recurrent chest pain. He denies SOB, N/V, or diaphoresis. He reports fatigue and dizziness. Advised patient on Dr. Theodosia Blender recommendation to go to the ER. Patient verbalized understanding.

## 2021-01-25 NOTE — ED Notes (Addendum)
Left sided facial droop with left sided weakness with drift noted in triage. LSN 4076. Code stroke activated in triage at this time. Thurmond Butts, Agricultural consultant notified, Human resources officer notified. Primary RN notified. CT notified for pt to go to room 1. Labs collected in triage. Delay in transport to CT d/t difficulty stick in triage. Pt takes Eliquis.

## 2021-01-25 NOTE — ED Notes (Signed)
Patient transported to MRI after ativan administration

## 2021-01-25 NOTE — ED Provider Notes (Signed)
Emergency Medicine Provider Triage Evaluation Note  Lawrence [name] , a 65 y.o. male  was evaluated in triage.  Pt complains of intermittent slurred speech for about 1 week.  He does report chest pain. EMS reports he is in A. fib and he is anticoagulated with Eliquis, his last dose was this morning.  He did not have any arm or leg weakness or facial droop with EMS.   Review of Systems  Positive: Headache, chest pain Negative: Fevers, chills  Physical Exam  BP 116/65 (BP Location: Left Arm)   Pulse 87   Temp 98.4 F (36.9 C) (Oral)   Resp 18   Ht 6\' 2"  (1.88 m)   Wt (!) 176 kg   SpO2 97%   BMI 49.82 kg/m  Gen:   Awake, no distress   Resp:  Normal effort  MSK:   Patient has left-sided pronator drift noted.  He has significantly diminished strength in his left leg compared to his right and he states that his right leg is his good leg. He has obvious left-sided facial droop. Other:  Speech is slurred.   Medical Decision Making  Medically screening exam initiated at 6:30 PM.  Appropriate orders placed.  Lawrence [name] was informed that the remainder of the evaluation will be completed by another provider, this initial triage assessment does not replace that evaluation, and the importance of remaining in the ED until their evaluation is complete.   Patient presents and did not have any weakness or facial droop according to EMS.  When I evaluate him he has obvious left pronator drift, left-sided facial weakness with slurred speech and has significant weakness in his left leg. While he has been having intermittent slurred speech this is the first time he has had any arm or leg weakness.  Given that he did not have this with EMS and it appears that his facial droop has worsened even in the 5 to 10 minutes he was in triage to get labs drawn code stroke is called. I placed code stroke orders and accompanied patient to CT scan, gave report to neurology, and ER resident.   Lorin Glass,  PA-C 01/25/21 1833    Daleen Bo, MD 01/26/21 [phone]

## 2021-01-25 NOTE — ED Notes (Signed)
Pt girlfriend called to get an update on patient or speak with patient directly if able; Crista Curb [phone]

## 2021-01-25 NOTE — Progress Notes (Signed)
Not available for EEG at this time going to imaging.  Will try back later.

## 2021-01-25 NOTE — ED Provider Notes (Signed)
[facility] EMERGENCY DEPARTMENT Provider Note   CSN: [id number] Arrival date & time: 01/25/21  7253  An emergency department physician performed an initial assessment on this suspected stroke patient at 1820.  History Chief Complaint  Patient presents with   Code Stroke    Lawrence [name] is a 65 y.o. male.  The history is provided by the patient, the EMS personnel and medical records.  Cerebrovascular Accident This is a new problem. The current episode started less than 1 hour ago. The problem occurs constantly. The problem has been gradually worsening. Pertinent negatives include no chest pain, no abdominal pain, no headaches and no shortness of breath. Nothing aggravates the symptoms. Nothing relieves the symptoms. He has tried nothing for the symptoms.   Patient is a 65 year old male with history of atrial fibrillation anticoagulated on Eliquis, CHF, CAD, OSA, COPD, diabetes and BPH who presents to the emergency department with intermittent, waxing and waning slurred speech for about 1 week. Transported to the emergency department via EMS who reported he did not have any focal neurologic deficits on their exam. When patient arrived to triage the physician assistant noted left-sided pronator drift as well as decreased strength in his left lower extremity. Code stroke was activated and patient was taken directly to [place].     Past Medical History:  Diagnosis Date   Acquired dilation of ascending aorta and aortic root ([place])    56mm by echo 01/2021   Acute on chronic diastolic CHF (congestive heart failure) ([place]) 01/13/2018   Acute respiratory failure with hypoxia ([place]) 01/13/2018   Adenomatous colon polyp 2007   Anasarca 01/31/2017   Anemia    Anxiety    Aortic stenosis    moderate AS by echo 01/2021   Arthritis    Asthma    Bilateral lower extremity edema 01/31/2017   Bilateral lower leg cellulitis    BPH without obstruction/lower urinary tract  symptoms 02/22/2017   Cellulitis 11/07/2017   Cellulitis of lower extremity    Chest pain    Chronic cough    Chronic diastolic CHF (congestive heart failure) (HCC)    Chronic venous stasis 03/07/2019   COPD (chronic obstructive pulmonary disease) (HCC)    Coronary artery calcification seen on CAT scan    Depression    Diabetic neuropathy ([place]) 09/11/2019   Hematuria 02/22/2017   History of anemia 12/26/2018   History of cardiac cath    a.  01/2018 with angiographically minimal coronary disease with  up to 40% prox LAD and 20% mid LCx and OM2 with normal LVF and normal LVEDP.[name]   History of colon polyps 08/24/2018   Hypertension    Leukocytosis 08/18/2018   Morbid obesity ([place])    New onset type 2 diabetes mellitus ([place]) 11/08/2017   OSA (obstructive sleep apnea)    Pain due to onychomycosis of toenails of both feet 09/11/2019   Peripheral neuropathy 02/22/2017   Physical deconditioning 02/22/2017   Primary osteoarthritis, left shoulder 03/05/2017   PTSD (post-traumatic stress disorder)    Pure hypercholesterolemia    QT prolongation 03/07/2019   Right foot pain 01/13/2018   Shortness of breath    Sinus tachycardia 03/07/2019   Sleep apnea    CPAP   Type 2 diabetes mellitus with vascular disease ([place]) 09/11/2019    Patient Active Problem List   Diagnosis Date Noted   Acute left-sided weakness 01/25/2021   AF (paroxysmal atrial fibrillation) ([place]) 01/25/2021   COPD (chronic obstructive pulmonary disease) ([place]) 01/25/2021  Moderate aortic stenosis 01/21/2021   Acquired dilation of ascending aorta and aortic root ([place]) 01/21/2021   Coronary artery disease due to lipid rich plaque    SVT (supraventricular tachycardia) (HCC)    Partial seizure ([place])    Acute CHF (congestive heart failure) ([place]) 10/17/2020   Essential hypertension 10/17/2020   Hypokalemia 10/17/2020   Cellulitis of right leg 12/04/2019   Pain due to onychomycosis of toenails of both feet 09/11/2019    Diabetic neuropathy ([place]) 09/11/2019   Type 2 diabetes mellitus with diabetic polyneuropathy, with long-term current use of insulin ([place]) 09/11/2019   Sinus tachycardia 03/07/2019   Chronic venous stasis 03/07/2019   History of anemia 12/26/2018   Anemia 08/24/2018   History of colon polyps 08/24/2018   Do not resuscitate status 08/24/2018   Chronic diastolic CHF (congestive heart failure) ([place]) 08/18/2018   Leukocytosis 08/18/2018   Bilateral lower leg cellulitis    Coronary artery calcification seen on CAT scan    Pure hypercholesterolemia    Chest pain    Shortness of breath    Acute respiratory failure with hypoxia ([place]) 01/13/2018   Right foot pain 01/13/2018   Acute on chronic diastolic CHF (congestive heart failure) ([place]) 01/13/2018   Diabetes mellitus type 2 in obese ([place]) 01/13/2018   New onset type 2 diabetes mellitus ([place]) 11/08/2017   Cellulitis 11/07/2017   Primary osteoarthritis, left shoulder 03/05/2017   Essential hypertension, benign 02/22/2017   BPH without obstruction/lower urinary tract symptoms 02/22/2017   Hematuria 02/22/2017   Peripheral neuropathy 02/22/2017   Physical deconditioning 02/22/2017   Morbid obesity ([place])    PTSD (post-traumatic stress disorder)    Anasarca 01/31/2017   Bilateral lower extremity edema 01/31/2017   OSA (obstructive sleep apnea)     Past Surgical History:  Procedure Laterality Date   ENDOVENOUS ABLATION SAPHENOUS VEIN W/ LASER Right 08/20/2020   endovenous laser ablation right greater saphenous vein by Gae Gallop MD    JOINT REPLACEMENT     left knee replacement x 2   KNEE ARTHROSCOPY Bilateral    LEFT HEART CATH AND CORONARY ANGIOGRAPHY N/A 01/17/2018   Procedure: LEFT HEART CATH AND CORONARY ANGIOGRAPHY;  Surgeon: Leonie Man, MD;  Location: [facility];  Service: Cardiovascular;  Laterality: N/A;   UMBILICAL HERNIA REPAIR         Family History  Problem Relation Age of Onset   CAD Maternal  Grandfather    Diabetes Other    Diabetes Mellitus II Neg Hx    Colon cancer Neg Hx    Esophageal cancer Neg Hx    Inflammatory bowel disease Neg Hx    Liver disease Neg Hx    Pancreatic cancer Neg Hx    Rectal cancer Neg Hx    Stomach cancer Neg Hx     Social History   Tobacco Use   Smoking status: Former    Pack years: 0.00    Types: Cigarettes    Quit date: 03/07/2016    Years since quitting: 4.8   Smokeless tobacco: Never  Substance Use Topics   Alcohol use: Not Currently    Comment: rare   Drug use: No    Home Medications Prior to Admission medications   Medication Sig Start Date End Date Taking? Authorizing Provider  Accu-Chek FastClix Lancets MISC 1 each by Other route in the morning and at bedtime. 12/22/20  Yes [provider]  acetaminophen (TYLENOL) 650 MG CR tablet Take 1,300 mg by mouth every  8 (eight) hours as needed for pain.   Yes [provider]  albuterol (VENTOLIN HFA) 108 (90 Base) MCG/ACT inhaler Inhale 2 puffs into the lungs every 6 (six) hours as needed for wheezing or shortness of breath. 03/10/20  Yes [provider]  Alcohol Swabs (ALCOHOL PREP) 70 % PADS 1 each by Other route in the morning, at noon, and at bedtime. 12/22/20  Yes [provider]  allopurinol (ZYLOPRIM) 100 MG tablet Take 100 mg by mouth 2 (two) times daily.   Yes [provider]  apixaban (ELIQUIS) 5 MG TABS tablet Take 1 tablet (5 mg total) by mouth 2 (two) times daily. 01/07/21  Yes Turner, Eber Hong, MD  ARIPiprazole (ABILIFY) 5 MG tablet Take 1 tablet (5 mg total) by mouth at bedtime. 01/08/21  Yes Mozingo, Berdie Ogren, NP  atorvastatin (LIPITOR) 40 MG tablet TAKE ONE TABLET BY MOUTH DAILY AT 6PM Patient taking differently: Take 40 mg by mouth every evening. 01/07/21  Yes Turner, Eber Hong, MD  B-D ULTRAFINE III SHORT PEN 31G X 8 MM MISC 1 each by Other route See admin instructions. Use to inject insulin subcutaneously at bedtime as directed.  11/20/20  Yes [provider]  blood glucose meter kit and supplies Dispense based on patient and insurance preference. Use up to four times daily as directed. (FOR ICD-10 E10.9, E11.9). Patient taking differently: 1 each by Other route See admin instructions. Dispense based on patient and insurance preference. Use up to four times daily as directed. (FOR ICD-10 E10.9, E11.9). 11/09/17  Yes Eugenie Filler, MD  busPIRone (BUSPAR) 5 MG tablet Take 1 tablet (5 mg total) by mouth 3 (three) times daily. 12/10/20  Yes Mozingo, Berdie Ogren, NP  cetirizine (ZYRTEC) 10 MG tablet Take 10 mg by mouth at bedtime.    Yes [provider]  dapagliflozin propanediol (FARXIGA) 10 MG TABS tablet Take 1 tablet (10 mg total) by mouth daily before breakfast. 01/05/21  Yes Renato Shin, MD  Dulaglutide (TRULICITY) 4.5 BT/5.1VO SOPN Inject 4.5 mg as directed once a week. 11/19/20  Yes Renato Shin, MD  ferrous sulfate 324 (65 Fe) MG TBEC Take 324 mg by mouth daily with breakfast.    Yes [provider]  fluticasone (FLONASE) 50 MCG/ACT nasal spray Place 2 sprays into both nostrils daily.    Yes [provider]  furosemide (LASIX) 80 MG tablet Take 80-160 mg by mouth in the morning and at bedtime. 10/28/20  Yes [provider]  isosorbide mononitrate (IMDUR) 30 MG 24 hr tablet Take 1 tablet (30 mg total) by mouth daily. 01/07/21  Yes Turner, Eber Hong, MD  LANTUS SOLOSTAR 100 UNIT/ML Solostar Pen Inject 12 Units into the skin every morning. 11/19/20  Yes Renato Shin, MD  metFORMIN (GLUCOPHAGE) 500 MG tablet Take 1,000 mg by mouth 2 (two) times daily with a meal.   Yes [provider]  metoprolol succinate (TOPROL-XL) 25 MG 24 hr tablet Take 1 tablet (25 mg total) by mouth daily. 01/07/21  Yes Turner, Eber Hong, MD  omeprazole (PRILOSEC) 20 MG capsule Take 20 mg by mouth every morning. 05/29/20  Yes [provider]  oxyCODONE (OXY IR/ROXICODONE) 5 MG immediate release  tablet Take 5 mg by mouth every 8 (eight) hours as needed for severe pain. 09/17/20  Yes [provider]  potassium chloride SA (KLOR-CON) 20 MEQ tablet Take 2 tablets (40 mEq total) by mouth daily. 01/07/21  Yes Sueanne Margarita, MD  prazosin (MINIPRESS)  2 MG capsule Take 2 capsules (4 mg total) by mouth at bedtime. 01/07/21  Yes Turner, Eber Hong, MD  pregabalin (LYRICA) 100 MG capsule Take 100 mg by mouth in the morning, at noon, and at bedtime.  07/14/19  Yes [provider]  silver sulfADIAZINE (SILVADENE) 1 % cream Apply 1 application topically See admin instructions. Apply as directed once a day as directed to bilateral legs   Yes [provider]  spironolactone (ALDACTONE) 25 MG tablet Take 1 tablet (25 mg total) by mouth daily. 01/07/21  Yes Turner, Eber Hong, MD  temazepam (RESTORIL) 15 MG capsule Take 1 capsule (15 mg total) by mouth at bedtime. 12/22/20  Yes Mozingo, Berdie Ogren, NP  torsemide (DEMADEX) 20 MG tablet Take 2 tablets (40 mg total) in the morning and 1 tablet (20 mg total) in the evening. 01/07/21  Yes Turner, Eber Hong, MD  traZODone (DESYREL) 100 MG tablet Take 2 tablets (200 mg total) by mouth at bedtime. 12/31/20  Yes Mozingo, Berdie Ogren, NP  venlafaxine XR (EFFEXOR-XR) 75 MG 24 hr capsule Take 3 capsules (225 mg total) by mouth daily with breakfast. 12/10/20  Yes Mozingo, Berdie Ogren, NP  Vitamin D, Ergocalciferol, (DRISDOL) 1.25 MG (50000 UNIT) CAPS capsule Take 50,000 Units by mouth every 7 (seven) days.   Yes [provider]    Allergies    Vancomycin, Niacin, Niacin and related, Tubersol [tuberculin], and Doxycycline  Review of Systems   Review of Systems  Constitutional:  Negative for chills and fever.  HENT:  Negative for ear pain and sore throat.   Eyes:  Negative for pain and visual disturbance.  Respiratory:  Negative for cough and shortness of breath.   Cardiovascular:  Negative for chest pain and palpitations.   Gastrointestinal:  Negative for abdominal pain and vomiting.  Genitourinary:  Negative for dysuria and hematuria.  Musculoskeletal:  Negative for arthralgias and back pain.  Skin:  Negative for color change and rash.  Neurological:  Positive for facial asymmetry, speech difficulty and weakness. Negative for seizures, syncope and headaches.  All other systems reviewed and are negative.  Physical Exam Updated Vital Signs BP 128/82   Pulse (!) 108   Temp 98.4 F (36.9 C) (Oral)   Resp (!) 21   Ht $R'6\' 2"'ao$  (1.88 m)   Wt (!) 176 kg   SpO2 90%   BMI 49.82 kg/m   Physical Exam Vitals and nursing note reviewed.  Constitutional:      Appearance: He is well-developed. He is morbidly obese. He is ill-appearing (chronically).  HENT:     Head: Normocephalic and atraumatic.  Eyes:     Conjunctiva/sclera: Conjunctivae normal.  Cardiovascular:     Rate and Rhythm: Normal rate and regular rhythm.     Pulses:          Radial pulses are 2+ on the right side and 2+ on the left side.     Heart sounds: No murmur heard.    Comments:  Bilateral lower leg venous stasis without wounds or drainage. Pulmonary:     Effort: Pulmonary effort is normal. No tachypnea or respiratory distress.     Breath sounds: Normal breath sounds. No decreased air movement.  Abdominal:     General: Abdomen is protuberant.     Palpations: Abdomen is soft.     Tenderness: There is no abdominal tenderness. There is no guarding or rebound.  Musculoskeletal:     Cervical back: Neck supple.     Right  lower leg: 2+ Pitting Edema present.     Left lower leg: 2+ Pitting Edema present.  Skin:    General: Skin is warm and dry.  Neurological:     Mental Status: He is alert and oriented to person, place, and time.     GCS: GCS eye subscore is 4. GCS verbal subscore is 5. GCS motor subscore is 6.     Cranial Nerves: Dysarthria and facial asymmetry present.     Sensory: Sensory deficit present.     Motor: Weakness present.      Comments: LHB weakness lower > upper Slurred speech present. No obvious aphasia LUE drift present    ED Results / Procedures / Treatments   Labs (all labs ordered are listed, but only abnormal results are displayed) Labs Reviewed  CBC - Abnormal; Notable for the following components:      Result Value   WBC 13.1 (*)    RDW 15.6 (*)    All other components within normal limits  DIFFERENTIAL - Abnormal; Notable for the following components:   Neutro Abs 10.0 (*)    Abs Immature Granulocytes 0.09 (*)    All other components within normal limits  COMPREHENSIVE METABOLIC PANEL - Abnormal; Notable for the following components:   Chloride 95 (*)    Glucose, Bld 214 (*)    All other components within normal limits  RAPID URINE DRUG SCREEN, HOSP PERFORMED - Abnormal; Notable for the following components:   Benzodiazepines POSITIVE (*)    All other components within normal limits  URINALYSIS, ROUTINE W REFLEX MICROSCOPIC - Abnormal; Notable for the following components:   Color, Urine STRAW (*)    Glucose, UA >=500 (*)    All other components within normal limits  I-STAT CHEM 8, ED - Abnormal; Notable for the following components:   Glucose, Bld 214 (*)    Calcium, Ion 1.12 (*)    All other components within normal limits  CBG MONITORING, ED - Abnormal; Notable for the following components:   Glucose-Capillary 198 (*)    All other components within normal limits  CBG MONITORING, ED - Abnormal; Notable for the following components:   Glucose-Capillary 185 (*)    All other components within normal limits  RESP PANEL BY RT-PCR (FLU A&B, COVID) ARPGX2  ETHANOL  PROTIME-INR  APTT  C-REACTIVE PROTEIN  PROCALCITONIN  LACTIC ACID, PLASMA  HIV ANTIBODY (ROUTINE TESTING W REFLEX)  HEMOGLOBIN A1C  LIPID PANEL    EKG None  Radiology DG Chest 1 View  Result Date: 01/25/2021 CLINICAL DATA:  65 year old male with concern for pneumonia. EXAM: CHEST  1 VIEW COMPARISON:  Chest  radiograph dated 11/13/2020. FINDINGS: Minimal bibasilar atelectasis. No focal consolidation, pleural effusion, pneumothorax. The cardiomegaly. No acute osseous pathology. IMPRESSION: No active cardiopulmonary disease. Electronically Signed   By: Anner Crete M.D.   On: 01/25/2021 20:54   CT CEREBRAL PERFUSION W CONTRAST  Result Date: 01/25/2021 CLINICAL DATA:  Left leg weakness EXAM: CT ANGIOGRAPHY HEAD AND NECK CT PERFUSION BRAIN TECHNIQUE: Multidetector CT imaging of the head and neck was performed using the standard protocol during bolus administration of intravenous contrast. Multiplanar CT image reconstructions and MIPs were obtained to evaluate the vascular anatomy. Carotid stenosis measurements (when applicable) are obtained utilizing NASCET criteria, using the distal internal carotid diameter as the denominator. Multiphase CT imaging of the brain was performed following IV bolus contrast injection. Subsequent parametric perfusion maps were calculated using RAPID software. CONTRAST:  123mL OMNIPAQUE IOHEXOL 350 MG/ML  SOLN COMPARISON:  None. FINDINGS: CTA NECK Aortic arch: Great vessel origins are patent. There is no high-grade proximal subclavian artery stenosis. Right carotid system: Patent. Mild calcified plaque at the proximal ICA without stenosis. Retropharyngeal course. Left carotid system: Patent. Mild calcified plaque at the proximal ICA without stenosis. Retropharyngeal course. Vertebral arteries: Patent.  Left vertebral artery is dominant. Skeleton: Degenerative changes of the cervical spine. Other neck: Unremarkable. Upper chest: No apical lung mass. Review of the MIP images confirms the above findings CTA HEAD Anterior circulation: Intracranial internal carotid arteries are patent with calcified plaque but no significant stenosis. Anterior and middle cerebral arteries are patent. Posterior circulation: Intracranial vertebral arteries are patent. Basilar artery is patent. Posterior cerebral  arteries are patent. Bilateral posterior communicating arteries are present. Venous sinuses: Patent as allowed by contrast bolus timing. Review of the MIP images confirms the above findings CT Brain Perfusion Findings: CBF (<30%) Volume: 62mL Perfusion (Tmax>6.0s) volume: 24mL Mismatch Volume: 80mL Infarction Location: None. IMPRESSION: No large vessel occlusion, hemodynamically significant stenosis, or evidence of dissection. Perfusion imaging demonstrates no evidence of core infarction or penumbra. Electronically Signed   By: Macy Mis M.D.   On: 01/25/2021 19:09   CT HEAD CODE STROKE WO CONTRAST  Result Date: 01/25/2021 CLINICAL DATA:  Code stroke.  Left leg weakness EXAM: CT HEAD WITHOUT CONTRAST TECHNIQUE: Contiguous axial images were obtained from the base of the skull through the vertex without intravenous contrast. COMPARISON:  Correlation made with MRI brain March 2022 FINDINGS: Brain: No acute intracranial hemorrhage, mass effect, or edema. Gray-white differentiation remains preserved. Minimal patchy hypoattenuation in the supratentorial white matter may reflect minor chronic microvascular ischemic changes. Ventricles and sulci are within normal limits in size and configuration. No extra-axial collection. Vascular: No hyperdense vessel. Skull: Unremarkable. Sinuses/Orbits: Paranasal sinus mucosal thickening. No acute orbital abnormality. Other: Mastoid air cells are clear. ASPECTS (Stokes Stroke Program Early CT Score) - Ganglionic level infarction (caudate, lentiform nuclei, internal capsule, insula, M1-M3 cortex): 7 - Supraganglionic infarction (M4-M6 cortex): 3 Total score (0-10 with 10 being normal): 10 IMPRESSION: There is no acute intracranial hemorrhage or evidence of acute infarction. ASPECT score is 10. These results were communicated to Dr. Lorrin Goodell at 6:49 pm on 01/25/2021 by text page via the [facility] messaging system. Electronically Signed   By: Macy Mis M.D.   On: 01/25/2021 18:50    CT ANGIO HEAD CODE STROKE  Result Date: 01/25/2021 CLINICAL DATA:  Left leg weakness EXAM: CT ANGIOGRAPHY HEAD AND NECK CT PERFUSION BRAIN TECHNIQUE: Multidetector CT imaging of the head and neck was performed using the standard protocol during bolus administration of intravenous contrast. Multiplanar CT image reconstructions and MIPs were obtained to evaluate the vascular anatomy. Carotid stenosis measurements (when applicable) are obtained utilizing NASCET criteria, using the distal internal carotid diameter as the denominator. Multiphase CT imaging of the brain was performed following IV bolus contrast injection. Subsequent parametric perfusion maps were calculated using RAPID software. CONTRAST:  163mL OMNIPAQUE IOHEXOL 350 MG/ML SOLN COMPARISON:  None. FINDINGS: CTA NECK Aortic arch: Great vessel origins are patent. There is no high-grade proximal subclavian artery stenosis. Right carotid system: Patent. Mild calcified plaque at the proximal ICA without stenosis. Retropharyngeal course. Left carotid system: Patent. Mild calcified plaque at the proximal ICA without stenosis. Retropharyngeal course. Vertebral arteries: Patent.  Left vertebral artery is dominant. Skeleton: Degenerative changes of the cervical spine. Other neck: Unremarkable. Upper chest: No apical lung mass. Review of the MIP images confirms the above  findings CTA HEAD Anterior circulation: Intracranial internal carotid arteries are patent with calcified plaque but no significant stenosis. Anterior and middle cerebral arteries are patent. Posterior circulation: Intracranial vertebral arteries are patent. Basilar artery is patent. Posterior cerebral arteries are patent. Bilateral posterior communicating arteries are present. Venous sinuses: Patent as allowed by contrast bolus timing. Review of the MIP images confirms the above findings CT Brain Perfusion Findings: CBF (<30%) Volume: 28mL Perfusion (Tmax>6.0s) volume: 42mL Mismatch Volume: 54mL  Infarction Location: None. IMPRESSION: No large vessel occlusion, hemodynamically significant stenosis, or evidence of dissection. Perfusion imaging demonstrates no evidence of core infarction or penumbra. Electronically Signed   By: Macy Mis M.D.   On: 01/25/2021 19:09   CT ANGIO NECK CODE STROKE  Result Date: 01/25/2021 CLINICAL DATA:  Left leg weakness EXAM: CT ANGIOGRAPHY HEAD AND NECK CT PERFUSION BRAIN TECHNIQUE: Multidetector CT imaging of the head and neck was performed using the standard protocol during bolus administration of intravenous contrast. Multiplanar CT image reconstructions and MIPs were obtained to evaluate the vascular anatomy. Carotid stenosis measurements (when applicable) are obtained utilizing NASCET criteria, using the distal internal carotid diameter as the denominator. Multiphase CT imaging of the brain was performed following IV bolus contrast injection. Subsequent parametric perfusion maps were calculated using RAPID software. CONTRAST:  154mL OMNIPAQUE IOHEXOL 350 MG/ML SOLN COMPARISON:  None. FINDINGS: CTA NECK Aortic arch: Great vessel origins are patent. There is no high-grade proximal subclavian artery stenosis. Right carotid system: Patent. Mild calcified plaque at the proximal ICA without stenosis. Retropharyngeal course. Left carotid system: Patent. Mild calcified plaque at the proximal ICA without stenosis. Retropharyngeal course. Vertebral arteries: Patent.  Left vertebral artery is dominant. Skeleton: Degenerative changes of the cervical spine. Other neck: Unremarkable. Upper chest: No apical lung mass. Review of the MIP images confirms the above findings CTA HEAD Anterior circulation: Intracranial internal carotid arteries are patent with calcified plaque but no significant stenosis. Anterior and middle cerebral arteries are patent. Posterior circulation: Intracranial vertebral arteries are patent. Basilar artery is patent. Posterior cerebral arteries are patent.  Bilateral posterior communicating arteries are present. Venous sinuses: Patent as allowed by contrast bolus timing. Review of the MIP images confirms the above findings CT Brain Perfusion Findings: CBF (<30%) Volume: 80mL Perfusion (Tmax>6.0s) volume: 26mL Mismatch Volume: 102mL Infarction Location: None. IMPRESSION: No large vessel occlusion, hemodynamically significant stenosis, or evidence of dissection. Perfusion imaging demonstrates no evidence of core infarction or penumbra. Electronically Signed   By: Macy Mis M.D.   On: 01/25/2021 19:09    Procedures Procedures   Medications Ordered in ED Medications  aspirin EC tablet 81 mg (has no administration in time range)  LORazepam (ATIVAN) injection 1 mg (has no administration in time range)  allopurinol (ZYLOPRIM) tablet 100 mg (has no administration in time range)  oxyCODONE (Oxy IR/ROXICODONE) immediate release tablet 5 mg (has no administration in time range)  atorvastatin (LIPITOR) tablet 40 mg (has no administration in time range)  isosorbide mononitrate (IMDUR) 24 hr tablet 30 mg (has no administration in time range)  metoprolol succinate (TOPROL-XL) 24 hr tablet 25 mg (has no administration in time range)  prazosin (MINIPRESS) capsule 4 mg (has no administration in time range)  spironolactone (ALDACTONE) tablet 25 mg (has no administration in time range)  torsemide (DEMADEX) tablet 40 mg (has no administration in time range)  torsemide (DEMADEX) tablet 20 mg (has no administration in time range)  ARIPiprazole (ABILIFY) tablet 5 mg (has no administration in time range)  busPIRone (  BUSPAR) tablet 5 mg (has no administration in time range)  temazepam (RESTORIL) capsule 15 mg (has no administration in time range)  traZODone (DESYREL) tablet 200 mg (has no administration in time range)  insulin glargine (LANTUS) Solostar Pen 12 Units (has no administration in time range)  pantoprazole (PROTONIX) EC tablet 40 mg (has no administration in  time range)  ferrous sulfate tablet 324 mg (has no administration in time range)  pregabalin (LYRICA) capsule 100 mg (has no administration in time range)  potassium chloride SA (KLOR-CON) CR tablet 40 mEq (has no administration in time range)  albuterol (PROVENTIL) (2.5 MG/3ML) 0.083% nebulizer solution 3 mL (has no administration in time range)  fluticasone (FLONASE) 50 MCG/ACT nasal spray 2 spray (has no administration in time range)  loratadine (CLARITIN) tablet 10 mg (has no administration in time range)   stroke: mapping our early stages of recovery book (has no administration in time range)  acetaminophen (TYLENOL) tablet 650 mg (has no administration in time range)    Or  acetaminophen (TYLENOL) 160 MG/5ML solution 650 mg (has no administration in time range)    Or  acetaminophen (TYLENOL) suppository 650 mg (has no administration in time range)  enoxaparin (LOVENOX) injection 40 mg (has no administration in time range)  polyethylene glycol (MIRALAX / GLYCOLAX) packet 17 g (has no administration in time range)  ondansetron (ZOFRAN) injection 4 mg (has no administration in time range)  insulin aspart (novoLOG) injection 0-20 Units (has no administration in time range)  hydrALAZINE (APRESOLINE) injection 10 mg (has no administration in time range)  iohexol (OMNIPAQUE) 350 MG/ML injection 100 mL (100 mLs Intravenous Contrast Given 01/25/21 1856)    ED Course  I have reviewed the triage vital signs and the nursing notes.  Pertinent labs & imaging results that were available during my care of the patient were reviewed by me and considered in my medical decision making (see chart for details).  Clinical Course as of 01/25/21 2204  Mon Jan 25, 2021  1817 [place] stroke [EH]  7078 Per EMS patient wasn't having weakness or slurred speech.  He has weakness in the left arm with pronator drift and left leg.  His slurred speech is worsening.   [EH]  1824 I accompanied patient to CT scan and  gave report directly to neurologist and ED resident.  We are using the last known well time of patient's arrival. [EH]  1902 Neurology team recommending medicine admission for further stroke w/u. [ZB]    Clinical Course User Index [EH] Lorin Glass, PA-C [ZB] Pearson Grippe, DO   MDM Rules/Calculators/A&P                          This is a 65 year old male with complicated past medical history as above who presented to the emergency department and found to have acute neurologic deficits as above prompting code stroke activation. These apparently occurred within the last 10 minutes prior to emergency department arrival. Emergency department he was hemodynamically stable, not hypertensive.  He was protecting his airway and had bilateral breath sounds.  Not hypoxic on room air.  He was taken immediately to the CT scanner.  Neurology team was at bedside at that time. Last known normal approximately 15 minutes prior to ED arrival. Took his Eliquis this morning.  No evidence of acute intracranial ischemia or hemorrhage on CT imaging.  Not a tPA candidate given systemic anticoagulation. No evidence of large vessel occlusion on  code stroke imaging thus not a candidate for neurosurgical intervention.  Neurology recommending admission to medicine for further stroke work-up.  Admitted to hospitalist.  Final Clinical Impression(s) / ED Diagnoses Final diagnoses:  Acute ischemic stroke [facility])  Cerebrovascular accident (CVA), unspecified mechanism [facility])    Rx / [place] Orders ED Discharge Orders     None        Pearson Grippe, DO 01/25/21 2205    Elnora Morrison, MD 01/25/21 [phone]

## 2021-01-25 NOTE — Consult Note (Addendum)
Neurology consult   CC: code stroke  History is obtained from: triage RN  HPI: [name] is a morbidly obese 65 yo male with a PMHx of DM II with peripheral neuropathy, AF on Eliquis, CdHF, anxietiy, AS, BPH, COPD, HLD, depression, and PTSD who was brought in by EMS with waxing and waning slurred speech. When he was in triage, he began to have left sided weakness. A code stroke was called and patient was brought emergently to CT.   Patient denies self history of stroke. FMHx of stroke in mother.        On Eliquis for AF, last dose this a.m.   LKW: unknown as to when slurred speech started but it had resolved in triage. Left sided weakness began in Triage at 1810.  tPA-no, took Eliquis this morning.   IR Thrombectomy?: No LVO.  MRS 1  NIHSS:  1a Level of Consciousness: 0 1b LOC Questions: 0 1c LOC Commands: 0 2 Best Gaze: 0 3 Visual: 0 4 Facial Palsy: 0 5a Motor Arm - left: 1 5b Motor Arm - Right: 0 6a Motor Leg - Left: 2 6b Motor Leg - Right: 0 7 Limb Ataxia: 0 8 Sensory: 1 9 Best Language: 0 10 Dysarthria: 0 11 Extinction and Inattention: 1 TOTAL:  1  ROS: A robust ROS was unable to be performed due to emergent nature of event.   Past Medical History:  Diagnosis Date   Acquired dilation of ascending aorta and aortic root ([place])    29mm by echo 01/2021   Acute on chronic diastolic CHF (congestive heart failure) ([place]) 01/13/2018   Acute respiratory failure with hypoxia ([place]) 01/13/2018   Adenomatous colon polyp 2007   Anasarca 01/31/2017   Anemia    Anxiety    Aortic stenosis    moderate AS by echo 01/2021   Arthritis    Asthma    Bilateral lower extremity edema 01/31/2017   Bilateral lower leg cellulitis    BPH without obstruction/lower urinary tract symptoms 02/22/2017   Cellulitis 11/07/2017   Cellulitis of lower extremity    Chest pain    Chronic cough    Chronic diastolic CHF (congestive heart failure) (HCC)    Chronic venous stasis 03/07/2019   COPD  (chronic obstructive pulmonary disease) (HCC)    Coronary artery calcification seen on CAT scan    Depression    Diabetic neuropathy ([place]) 09/11/2019   Hematuria 02/22/2017   History of anemia 12/26/2018   History of cardiac cath    a.  01/2018 with angiographically minimal coronary disease with  up to 40% prox LAD and 20% mid LCx and OM2 with normal LVF and normal LVEDP.[name]   History of colon polyps 08/24/2018   Hypertension    Leukocytosis 08/18/2018   Morbid obesity ([place])    New onset type 2 diabetes mellitus ([place]) 11/08/2017   OSA (obstructive sleep apnea)    Pain due to onychomycosis of toenails of both feet 09/11/2019   Peripheral neuropathy 02/22/2017   Physical deconditioning 02/22/2017   Primary osteoarthritis, left shoulder 03/05/2017   PTSD (post-traumatic stress disorder)    Pure hypercholesterolemia    QT prolongation 03/07/2019   Right foot pain 01/13/2018   Shortness of breath    Sinus tachycardia 03/07/2019   Sleep apnea    CPAP   Type 2 diabetes mellitus with vascular disease ([place]) 09/11/2019    Family History  Problem Relation Age of Onset   CAD Maternal Grandfather    Diabetes Other  Diabetes Mellitus II Neg Hx    Colon cancer Neg Hx    Esophageal cancer Neg Hx    Inflammatory bowel disease Neg Hx    Liver disease Neg Hx    Pancreatic cancer Neg Hx    Rectal cancer Neg Hx    Stomach cancer Neg Hx     Social History:  reports that he quit smoking about 4 years ago. He has never used smokeless tobacco. He reports previous alcohol use. He reports that he does not use drugs.   Prior to Admission medications   Medication Sig Start Date End Date Taking? Authorizing Provider  acetaminophen (TYLENOL) 650 MG CR tablet Take 1,300 mg by mouth every 8 (eight) hours as needed for pain.    [provider]  albuterol (VENTOLIN HFA) 108 (90 Base) MCG/ACT inhaler Inhale into the lungs. 03/10/20   [provider]  allopurinol (ZYLOPRIM) 100 MG tablet  Take 100 mg by mouth 2 (two) times daily.    [provider]  apixaban (ELIQUIS) 5 MG TABS tablet Take 1 tablet (5 mg total) by mouth 2 (two) times daily. 01/07/21   Sueanne Margarita, MD  ARIPiprazole (ABILIFY) 5 MG tablet Take 1 tablet (5 mg total) by mouth at bedtime. 01/08/21   Mozingo, Berdie Ogren, NP  atorvastatin (LIPITOR) 40 MG tablet TAKE ONE TABLET BY MOUTH DAILY AT 6PM 01/07/21   Sueanne Margarita, MD  blood glucose meter kit and supplies Dispense based on patient and insurance preference. Use up to four times daily as directed. (FOR ICD-10 E10.9, E11.9). 11/09/17   Eugenie Filler, MD  busPIRone (BUSPAR) 5 MG tablet Take 1 tablet (5 mg total) by mouth 3 (three) times daily. 12/10/20   Mozingo, Berdie Ogren, NP  cetirizine (ZYRTEC) 10 MG tablet Take 10 mg by mouth at bedtime.     [provider]  dapagliflozin propanediol (FARXIGA) 10 MG TABS tablet Take 1 tablet (10 mg total) by mouth daily before breakfast. 01/05/21   Renato Shin, MD  Dulaglutide (TRULICITY) 4.5 KY/7.0WC SOPN Inject 4.5 mg as directed once a week. 11/19/20   Renato Shin, MD  ferrous sulfate 324 (65 Fe) MG TBEC Take 324 mg by mouth daily with breakfast.     [provider]  fluticasone (FLONASE) 50 MCG/ACT nasal spray Place 2 sprays into both nostrils daily.     [provider]  isosorbide mononitrate (IMDUR) 30 MG 24 hr tablet Take 1 tablet (30 mg total) by mouth daily. 01/07/21   Sueanne Margarita, MD  LANTUS SOLOSTAR 100 UNIT/ML Solostar Pen Inject 12 Units into the skin every morning. 11/19/20   Renato Shin, MD  metFORMIN (GLUCOPHAGE) 1000 MG tablet Take 1,000 mg by mouth 2 (two) times daily with a meal.    [provider]  metoprolol succinate (TOPROL-XL) 25 MG 24 hr tablet Take 1 tablet (25 mg total) by mouth daily. 01/07/21   Sueanne Margarita, MD  omeprazole (PRILOSEC) 20 MG capsule Take 20 mg by mouth every morning. 05/29/20   [provider]  oxyCODONE (OXY  IR/ROXICODONE) 5 MG immediate release tablet Take 5 mg by mouth every 8 (eight) hours as needed. 09/17/20   [provider]  potassium chloride SA (KLOR-CON) 20 MEQ tablet Take 2 tablets (40 mEq total) by mouth daily. 01/07/21   Sueanne Margarita, MD  prazosin (MINIPRESS) 2 MG capsule Take 2 capsules (4 mg total) by mouth at bedtime. 01/07/21   Sueanne Margarita, MD  pregabalin (LYRICA) 100 MG capsule Take 100 mg by mouth in the morning, at noon, and at bedtime.  07/14/19   [provider]  silver sulfADIAZINE (SILVADENE) 1 % cream Apply 1 application topically See admin instructions. Apply as directed once a day as directed to bilateral legs    [provider]  spironolactone (ALDACTONE) 25 MG tablet Take 1 tablet (25 mg total) by mouth daily. 01/07/21   Quintella Reichert, MD  temazepam (RESTORIL) 15 MG capsule Take 1 capsule (15 mg total) by mouth at bedtime. 12/22/20   Mozingo, Thereasa Solo, NP  torsemide (DEMADEX) 20 MG tablet Take 2 tablets (40 mg total) in the morning and 1 tablet (20 mg total) in the evening. 01/07/21   Quintella Reichert, MD  traZODone (DESYREL) 100 MG tablet Take 2 tablets (200 mg total) by mouth at bedtime. 12/31/20   Mozingo, Thereasa Solo, NP  venlafaxine XR (EFFEXOR-XR) 75 MG 24 hr capsule Take 3 capsules (225 mg total) by mouth daily with breakfast. 12/10/20   Mozingo, Thereasa Solo, NP  Vitamin D, Ergocalciferol, (DRISDOL) 1.25 MG (50000 UNIT) CAPS capsule Take 50,000 Units by mouth every 7 (seven) days.    [provider]    Exam: Current vital signs: BP 116/65 (BP Location: Left Arm)   Pulse 87   Temp 98.4 F (36.9 C) (Oral)   Resp 18   Ht 6\' 2"  (1.88 m)   Wt (!) 176 kg   SpO2 97%   BMI 49.82 kg/m   Physical Exam  Constitutional: Appears chronically ill, but not toxic. Morbidly obese.  Psych: Affect appropriate to situation. Eyes: No scleral injection. HENT: No OP obstruction. Head: Normocephalic.  Cardiovascular: Normal rate and  regular rhythm.  Respiratory: Effort normal.  GI: Abdomen soft.  No distension. There is no tenderness.  Skin: WDI. Brownish discoloration to BLEs at mid tibia to feet.   Neuro: Mental Status: Patient is awake, alert, oriented to person, place, month, year, and situation. Patient is able to give a clear and coherent history. No signs of neglect. Speech/Language:  Speech is clear, fluent without dysarthria or aphasia. Repetition, naming, and comprehension intact.  Cranial Nerves: II: Visual Fields are full. Pupils are equal, round, and reactive to light.  III,IV, VI: EOMI without ptosis or diploplia.  V: Facial sensation is symmetric to light touch in V1, V2, and V3 VII: Facial movement symmetrical.  VIII: hearing is intact to voice. X: Uvula elevates symmetrically. XI: Shoulder shrug is symmetric. XII: tongue is midline without atrophy or fasciculations.  Motor: RUE:  grips  5/5     biceps  5/5     triceps  5/5 LUE:  grips   4-5    biceps  4-/5     triceps  4-/5 RLE: knee  5/5     thigh   5/5     plantar flexion   5/5     dorsiflexion  5/5 LLE: knee  1/5     thigh   1/5      plantar flexion  1 /5     dorsiflexion   /15 Tone is normal. Bulk is increased.   Sensory: Sensation is decreased to light touch on the left arm and leg. Extinction present to the left to light touch DSS.  Plantars: Toes are downgoing bilaterally.  Cerebellar: No ataxia noted with FNF. Can not perform HKS due to weakness.   I have reviewed labs in epic and the pertinent results are: INR 1.0  aPTT  25         creatinine  0.9      Glucose 214  MD reviewed the images obtained:  NCT head  There is no acute intracranial hemorrhage or evidence of acute infarction. ASPECT score is 10.  CTA head and neck and perfusion.  No large vessel occlusion, hemodynamically significant stenosis, or evidence of dissection.   Perfusion imaging demonstrates no evidence of core infarction or penumbra.  Assessment:  65 yo male who came in by ambulance for waxing and waning dysarthria which had resolved in ED triage. However, at 1810, patient had left sided weakness and code stroke was called. CTH negative for acute. CTA head, neck, and perfusion without LVO, core infarction or penumbra. tPA not consider as patient took his Eliquis this am. No IR procedure needed due to no LVO. His stroke risk factors include, hx tobacco abuse, DM II, HLD, and FMHx of stroke. His symptoms continued throughout CT and back in ED room. MRI brain asap.   Impression:  Stroke vs TIA, likely stroke since no improvement in symptoms yet.  2. Several stroke risk factors.   Plan: - Medicine admit. - MRI brain without contrast. - Recommend TTE to assess for embolic cause of stroke.  - Recommend labs: HbA1c, lipid panel, TSH. - Recommend Statin or increased dose if LDL > 70. - Aspirin $RemoveB'81mg'[id number]$  daily. - Continue Eliquis after MRI cleared for bleeding. Last dose this a.m.  - SBP goal - Permissive hypertension first 24 h < 220/110. Hold home medications for now. - Telemetry monitoring for arrhythmia. - bedside Swallow screen. - Stroke education. - PT/OT/SLP consult. - NIHSS as per protocol. - frequent neuro checks.  - Recommend metabolic/infectious workup with UA with UCx, CXR, CK, serum lactate.   This patient is critically ill and at significant risk of neurological worsening, death and care requires constant monitoring of vital signs, hemodynamics,respiratory and cardiac monitoring, neurological assessment, discussion with family, other specialists and medical decision making of high complexity. I spent 60 minutes of neurocritical care time  in the care of  this patient. This was time spent independent of any time provided by nurse practitioner or PA.   [place] Pager Number [phone]

## 2021-01-25 NOTE — ED Triage Notes (Signed)
Per ems, pt from home. Pt reports intermittent episodes of slurred speech x1 week and cp that started today. Denies n/v/d. EMS reports slurred speech has resolved. EMS EKG Afib. 20g left hand, EMS HR 80-86. 12 lead unremarkable.

## 2021-01-25 NOTE — ED Notes (Signed)
Pt transported to CT1 at this time with primary RN Mykenzie.

## 2021-01-25 NOTE — Code Documentation (Signed)
Stroke Response Nurse Documentation Code Stroke Documentation [name]  is a 65 y.o.  y.o. male  arriving to [facility] ED via [facility] on 01/25/2021 with PMH of CHF, CAD, HTN, COPD, DM. Code stroke was activated by ED. Patient from home where he was LKW at 1810 and now complaining of L weakness and dysarthria. Per pt he has been having dysarthria intermittently for a "few weeks". EMS noted no unilateral weakness en route, onset occurred in triage. Patient taking Eliquis (apixaban) daily PTA. Stroke team at the bedside on patient arrival, CBG 198, labs drawn and patient cleared for CT by EDP E. Phylliss Bob. Patient taken to CT with team. NIHSS 7, see documentation for details and code stroke times. Patient with left facial droop, left arm weakness, left leg weakness, left decreased sensation, and dysarthria  on exam. The following imaging was completed:  CT, CTA head and neck, CTP. Patient is not a candidate for tPA due to eliquis usage, last dose this AM. Patient is not a candidate for NIR at this time per Dr. Lorrin Goodell, if patient worsens significantly and is clinically LVO+ per VAN scale please re-activate code stroke if within 24h window of LSN. Q2h VS/mNIHSS per protocol. Bedside handoff with ED RN Mykenzie.    Lenore Manner  Stroke Response RN [phone]

## 2021-01-26 ENCOUNTER — Inpatient Hospital Stay (HOSPITAL_COMMUNITY): Payer: Medicare Other

## 2021-01-26 ENCOUNTER — Other Ambulatory Visit (HOSPITAL_COMMUNITY): Payer: Medicare Other

## 2021-01-26 ENCOUNTER — Inpatient Hospital Stay (HOSPITAL_BASED_OUTPATIENT_CLINIC_OR_DEPARTMENT_OTHER): Payer: Medicare Other

## 2021-01-26 DIAGNOSIS — Z743 Need for continuous supervision: Secondary | ICD-10-CM | POA: Diagnosis not present

## 2021-01-26 DIAGNOSIS — Z7401 Bed confinement status: Secondary | ICD-10-CM | POA: Diagnosis not present

## 2021-01-26 DIAGNOSIS — G459 Transient cerebral ischemic attack, unspecified: Secondary | ICD-10-CM

## 2021-01-26 DIAGNOSIS — R5381 Other malaise: Secondary | ICD-10-CM | POA: Diagnosis not present

## 2021-01-26 DIAGNOSIS — R531 Weakness: Principal | ICD-10-CM

## 2021-01-26 LAB — ECHOCARDIOGRAM LIMITED
AR max vel: 1.54 cm2
AV Area VTI: 1.53 cm2
AV Area mean vel: 1.49 cm2
AV Mean grad: 19 mmHg
AV Peak grad: 32.9 mmHg
Ao pk vel: 2.87 m/s
Height: 74 in
Weight: 6208 oz

## 2021-01-26 LAB — LIPID PANEL
Cholesterol: 129 mg/dL (ref 0–200)
HDL: 30 mg/dL — ABNORMAL LOW (ref >40)
LDL Cholesterol: 50 mg/dL (ref 0–99)
Total CHOL/HDL Ratio: 4.3 RATIO
Triglycerides: 245 mg/dL — ABNORMAL HIGH (ref <150)
VLDL: 49 mg/dL — ABNORMAL HIGH (ref 0–40)

## 2021-01-26 LAB — GLUCOSE, CAPILLARY
Glucose-Capillary: 206 mg/dL — ABNORMAL HIGH (ref 70–99)
Glucose-Capillary: 185 mg/dL — ABNORMAL HIGH (ref 70–99)

## 2021-01-26 LAB — CBG MONITORING, ED: Glucose-Capillary: 209 mg/dL — ABNORMAL HIGH (ref 70–99)

## 2021-01-26 MED ORDER — ACETAMINOPHEN 325 MG PO TABS: 650.0000 mg | ORAL_TABLET | ORAL | Status: DC | PRN

## 2021-01-26 MED ORDER — FERROUS SULFATE 325 (65 FE) MG PO TABS
325.0000 mg | ORAL_TABLET | Freq: Every day | ORAL | Status: DC
  Administered 2021-01-26: 325 mg via ORAL

## 2021-01-26 NOTE — TOC Transition Note (Signed)
Transition of Care [facility]) - CM/SW Discharge Note   Patient Details  Name: Lawrence [name] MRN: [id number] Date of Birth: 02-Nov-1955  [facility] CM/SW Contact:  Pollie Friar, RN Phone Number: 01/26/2021, 4:50 PM   Clinical Narrative:    Patient discharging home with resumption of [place] services through [place]. Stacey with [place] [facility] aware of his d/c home. Pt has to transport via [facility]. CM has verified address and called in transport. Bedside RN updated and d/c packet at the desk.    Final next level of care: [place] Barriers to Discharge: No Barriers Identified   Patient Goals and CMS Choice        Discharge Placement                       Discharge Plan and Services   Discharge Planning Services: CM Consult Post Acute Care Choice: Home Health                    HH Arranged: RN, PT [facility] Agency: [place] Date [place]: 01/26/21   Representative spoke with at [place]: [place] ([place]) Interventions     Readmission Risk Interventions Readmission Risk Prevention Plan 12/06/2019  Transportation Screening Complete  PCP or Specialist Appt within 5-7 Days Complete  Home Care Screening Complete  Medication Review (RN CM) Complete  Some recent data might be hidden

## 2021-01-26 NOTE — Progress Notes (Signed)
Discharged to home.  Transported by Corey Harold on stretcher.  Discharge instructions given and room checked to ensure that he had all of his belongings.

## 2021-01-26 NOTE — ED Notes (Signed)
Pt was taken to echo at [place]. Will go up to 3W08 when pt comes back from echo.

## 2021-01-26 NOTE — TOC Initial Note (Signed)
Transition of Care [facility]) - Initial/Assessment Note    Patient Details  Name: Lawrence [name] MRN: [id number] Date of Birth: 08/09/1955  Transition [facility]) CM/SW Contact:    Pollie Friar, RN Phone Number: 01/26/2021, 12:54 PM  Clinical Narrative:                 Pt lives with his significant other. He states she can provide the recommended supervision.  Pt has all needed DME at home.  He is active with [facility] services through [place].  Pt uses medical transport for his appointments.  Pt denies issues with his home medications. ToC following.  Expected Discharge Plan: [place] Barriers to Discharge: Continued Medical Work up   Patient Goals and CMS Choice        Expected Discharge Plan and Services Expected Discharge Plan: [place]   Discharge Planning Services: CM Consult Post Acute Care Choice: [place] arrangements for the past 2 months: Apartment                                      Prior Living Arrangements/Services Living arrangements for the past 2 months: Apartment Lives with:: Significant Other Patient language and need for interpreter reviewed:: Yes Do you feel safe going back to the place where you live?: Yes      Need for Family Participation in Patient Care: Yes (Comment) Care giver support system in place?: Yes (comment) Current home services: DME (wheelchair/ hospital bed/ rollator/ bedside Commode) Criminal Activity/Legal Involvement Pertinent to Current Situation/Hospitalization: No - Comment as needed  Activities of Daily Living Home Assistive Devices/Equipment: CBG Meter, BIPAP, Eyeglasses, Grab bars around toilet, Grab bars in shower, Hand-held shower hose, Hospital bed, Raised toilet seat with rails, Shower chair with back ADL Screening (condition at time of admission) Patient's cognitive ability adequate to safely complete daily activities?: Yes Is the patient deaf or have difficulty  hearing?: No Does the patient have difficulty seeing, even when wearing glasses/contacts?: Yes Does the patient have difficulty concentrating, remembering, or making decisions?: Yes Patient able to express need for assistance with ADLs?: Yes Does the patient have difficulty dressing or bathing?: No Independently performs ADLs?: No Communication: Independent Dressing (OT): Needs assistance Is this a change from baseline?: Pre-admission baseline Grooming: Independent Feeding: Independent Bathing: Needs assistance Is this a change from baseline?: Pre-admission baseline Toileting: Needs assistance Is this a change from baseline?: Pre-admission baseline In/Out Bed: Needs assistance Is this a change from baseline?: Pre-admission baseline Walks in Home: Independent with device (comment) Does the patient have difficulty walking or climbing stairs?: Yes Weakness of Legs: Left Weakness of Arms/Hands: None  Permission Sought/Granted                  Emotional Assessment Appearance:: Appears stated age Attitude/Demeanor/Rapport: Engaged Affect (typically observed): Accepting Orientation: : Oriented to Self, Oriented to Place, Oriented to  Time, Oriented to Situation   Psych Involvement: No (comment)  Admission diagnosis:  Pneumonia [J18.9] Acute ischemic stroke [facility]) [I63.9] Acute left-sided weakness [R53.1] Cerebrovascular accident (CVA), unspecified mechanism ([place]) [I63.9] Patient Active Problem List   Diagnosis Date Noted   Acute left-sided weakness 01/25/2021   AF (paroxysmal atrial fibrillation) ([place]) 01/25/2021   COPD (chronic obstructive pulmonary disease) ([place]) 01/25/2021   Moderate aortic stenosis 01/21/2021   Acquired dilation of ascending aorta and aortic root ([place]) 01/21/2021  Coronary artery disease due to lipid rich plaque    SVT (supraventricular tachycardia) (HCC)    Partial seizure (HCC)    Acute CHF (congestive heart failure) ([place]) 10/17/2020   Essential  hypertension 10/17/2020   Hypokalemia 10/17/2020   Cellulitis of right leg 12/04/2019   Pain due to onychomycosis of toenails of both feet 09/11/2019   Diabetic neuropathy ([place]) 09/11/2019   Type 2 diabetes mellitus with diabetic polyneuropathy, with long-term current use of insulin ([place]) 09/11/2019   Sinus tachycardia 03/07/2019   Chronic venous stasis 03/07/2019   History of anemia 12/26/2018   Anemia 08/24/2018   History of colon polyps 08/24/2018   Do not resuscitate status 08/24/2018   Chronic diastolic CHF (congestive heart failure) ([place]) 08/18/2018   Leukocytosis 08/18/2018   Bilateral lower leg cellulitis    Coronary artery calcification seen on CAT scan    Pure hypercholesterolemia    Chest pain    Shortness of breath    Acute respiratory failure with hypoxia ([place]) 01/13/2018   Right foot pain 01/13/2018   Acute on chronic diastolic CHF (congestive heart failure) ([place]) 01/13/2018   Diabetes mellitus type 2 in obese ([place]) 01/13/2018   New onset type 2 diabetes mellitus ([place]) 11/08/2017   Cellulitis 11/07/2017   Primary osteoarthritis, left shoulder 03/05/2017   Essential hypertension, benign 02/22/2017   BPH without obstruction/lower urinary tract symptoms 02/22/2017   Hematuria 02/22/2017   Peripheral neuropathy 02/22/2017   Physical deconditioning 02/22/2017   Morbid obesity ([place])    PTSD (post-traumatic stress disorder)    Anasarca 01/31/2017   Bilateral lower extremity edema 01/31/2017   OSA (obstructive sleep apnea)    PCP:  [name], PA-C Pharmacy:   [facility] - [place], [place] - [address] [place] [address] Phone: [phone] Fax: [phone]     Social Determinants of Health (SDOH) Interventions    Readmission Risk Interventions Readmission Risk Prevention Plan 12/06/2019  Transportation Screening Complete  PCP or Specialist Appt within 5-7 Days Complete  Home Care  Screening Complete  Medication Review (RN CM) Complete  Some recent data might be hidden

## 2021-01-26 NOTE — Progress Notes (Signed)
Pt and family waiting for PTAR

## 2021-01-26 NOTE — Plan of Care (Signed)
Problem: Education: Goal: Knowledge of General Education information will improve Description: Including pain rating scale, medication(s)/side effects and non-pharmacologic comfort measures 01/26/2021 1720 by Vesta Mixer, RN Outcome: Adequate for Discharge 01/26/2021 1629 by Vesta Mixer, RN Outcome: Adequate for Discharge   Problem: Health Behavior/Discharge Planning: Goal: Ability to manage health-related needs will improve 01/26/2021 1720 by Vesta Mixer, RN Outcome: Adequate for Discharge 01/26/2021 1629 by Vesta Mixer, RN Outcome: Adequate for Discharge   Problem: Clinical Measurements: Goal: Ability to maintain clinical measurements within normal limits will improve 01/26/2021 1720 by Vesta Mixer, RN Outcome: Adequate for Discharge 01/26/2021 1629 by Vesta Mixer, RN Outcome: Adequate for Discharge Goal: Will remain free from infection 01/26/2021 1720 by Vesta Mixer, RN Outcome: Adequate for Discharge 01/26/2021 1629 by Vesta Mixer, RN Outcome: Adequate for Discharge Goal: Diagnostic test results will improve 01/26/2021 1720 by Vesta Mixer, RN Outcome: Adequate for Discharge 01/26/2021 1629 by Vesta Mixer, RN Outcome: Adequate for Discharge Goal: Respiratory complications will improve 01/26/2021 1720 by Vesta Mixer, RN Outcome: Adequate for Discharge 01/26/2021 1629 by Vesta Mixer, RN Outcome: Adequate for Discharge Goal: Cardiovascular complication will be avoided 01/26/2021 1720 by Vesta Mixer, RN Outcome: Adequate for Discharge 01/26/2021 1629 by Vesta Mixer, RN Outcome: Adequate for Discharge   Problem: Activity: Goal: Risk for activity intolerance will decrease 01/26/2021 1720 by Vesta Mixer, RN Outcome: Adequate for Discharge 01/26/2021 1629 by Vesta Mixer, RN Outcome: Adequate for Discharge   Problem: Nutrition: Goal: Adequate nutrition will be maintained 01/26/2021 1720 by Vesta Mixer,  RN Outcome: Adequate for Discharge 01/26/2021 1629 by Vesta Mixer, RN Outcome: Adequate for Discharge   Problem: Coping: Goal: Level of anxiety will decrease 01/26/2021 1720 by Vesta Mixer, RN Outcome: Adequate for Discharge 01/26/2021 1629 by Vesta Mixer, RN Outcome: Adequate for Discharge   Problem: Elimination: Goal: Will not experience complications related to bowel motility 01/26/2021 1720 by Vesta Mixer, RN Outcome: Adequate for Discharge 01/26/2021 1629 by Vesta Mixer, RN Outcome: Adequate for Discharge Goal: Will not experience complications related to urinary retention 01/26/2021 1720 by Vesta Mixer, RN Outcome: Adequate for Discharge 01/26/2021 1629 by Vesta Mixer, RN Outcome: Adequate for Discharge   Problem: Pain Managment: Goal: General experience of comfort will improve 01/26/2021 1720 by Vesta Mixer, RN Outcome: Adequate for Discharge 01/26/2021 1629 by Vesta Mixer, RN Outcome: Adequate for Discharge   Problem: Safety: Goal: Ability to remain free from injury will improve 01/26/2021 1720 by Vesta Mixer, RN Outcome: Adequate for Discharge 01/26/2021 1629 by Vesta Mixer, RN Outcome: Adequate for Discharge   Problem: Skin Integrity: Goal: Risk for impaired skin integrity will decrease 01/26/2021 1720 by Vesta Mixer, RN Outcome: Adequate for Discharge 01/26/2021 1629 by Vesta Mixer, RN Outcome: Adequate for Discharge   Problem: Education: Goal: Knowledge of disease or condition will improve 01/26/2021 1720 by Vesta Mixer, RN Outcome: Adequate for Discharge 01/26/2021 1629 by Vesta Mixer, RN Outcome: Adequate for Discharge Goal: Knowledge of secondary prevention will improve 01/26/2021 1720 by Vesta Mixer, RN Outcome: Adequate for Discharge 01/26/2021 1629 by Vesta Mixer, RN Outcome: Adequate for Discharge Goal: Knowledge of patient specific risk factors addressed and post discharge  goals established will improve 01/26/2021 1720 by Vesta Mixer, RN Outcome: Adequate for Discharge 01/26/2021 1629 by Vesta Mixer, RN Outcome: Adequate for Discharge Goal: Individualized Educational  Video(s) 01/26/2021 1720 by Vesta Mixer, RN Outcome: Adequate for Discharge 01/26/2021 1629 by Vesta Mixer, RN Outcome: Adequate for Discharge   Problem: Coping: Goal: Will verbalize positive feelings about self 01/26/2021 1720 by Vesta Mixer, RN Outcome: Adequate for Discharge 01/26/2021 1629 by Vesta Mixer, RN Outcome: Adequate for Discharge Goal: Will identify appropriate support needs 01/26/2021 1720 by Vesta Mixer, RN Outcome: Adequate for Discharge 01/26/2021 1629 by Vesta Mixer, RN Outcome: Adequate for Discharge   Problem: Health Behavior/Discharge Planning: Goal: Ability to manage health-related needs will improve 01/26/2021 1720 by Vesta Mixer, RN Outcome: Adequate for Discharge 01/26/2021 1629 by Vesta Mixer, RN Outcome: Adequate for Discharge   Problem: Self-Care: Goal: Ability to participate in self-care as condition permits will improve 01/26/2021 1720 by Vesta Mixer, RN Outcome: Adequate for Discharge 01/26/2021 1629 by Vesta Mixer, RN Outcome: Adequate for Discharge Goal: Verbalization of feelings and concerns over difficulty with self-care will improve 01/26/2021 1720 by Vesta Mixer, RN Outcome: Adequate for Discharge 01/26/2021 1629 by Vesta Mixer, RN Outcome: Adequate for Discharge Goal: Ability to communicate needs accurately will improve 01/26/2021 1720 by Vesta Mixer, RN Outcome: Adequate for Discharge 01/26/2021 1629 by Vesta Mixer, RN Outcome: Adequate for Discharge   Problem: Nutrition: Goal: Risk of aspiration will decrease 01/26/2021 1720 by Vesta Mixer, RN Outcome: Adequate for Discharge 01/26/2021 1629 by Vesta Mixer, RN Outcome: Adequate for Discharge   Problem:  Intracerebral Hemorrhage Tissue Perfusion: Goal: Complications of Intracerebral Hemorrhage will be minimized 01/26/2021 1720 by Vesta Mixer, RN Outcome: Adequate for Discharge 01/26/2021 1629 by Vesta Mixer, RN Outcome: Adequate for Discharge   Problem: Ischemic Stroke/TIA Tissue Perfusion: Goal: Complications of ischemic stroke/TIA will be minimized 01/26/2021 1720 by Vesta Mixer, RN Outcome: Adequate for Discharge 01/26/2021 1629 by Vesta Mixer, RN Outcome: Adequate for Discharge   Problem: Spontaneous Subarachnoid Hemorrhage Tissue Perfusion: Goal: Complications of Spontaneous Subarachnoid Hemorrhage will be minimized 01/26/2021 1720 by Vesta Mixer, RN Outcome: Adequate for Discharge 01/26/2021 1629 by Vesta Mixer, RN Outcome: Adequate for Discharge

## 2021-01-26 NOTE — Care Management Obs Status (Signed)
[place] NOTIFICATION   Patient Details  Name: Lawrence [name] MRN: [id number] Date of Birth: [birth year]   Medicare Observation Status Notification Given:  Yes    Pollie Friar, RN 01/26/2021, 12:21 PM

## 2021-01-26 NOTE — Procedures (Signed)
Patient Name: Sajid Ruppert  MRN: [id number]  Epilepsy Attending: Lora Havens  Referring Physician/Provider: Dr Inda Merlin Date: 01/06/2021 Duration: 33.31 mins  Patient history: 65 year old male with waxing and waning dysarthria as well as left-sided weakness.  EEG to evaluate for seizures.  Level of alertness: Awake, asleep  AEDs during EEG study: Temazepam  Technical aspects: This EEG study was done with scalp electrodes positioned according to the 10-20 International system of electrode placement. Electrical activity was acquired at a sampling rate of 500Hz  and reviewed with a high frequency filter of 70Hz  and a low frequency filter of 1Hz . EEG data were recorded continuously and digitally stored.   Description: The posterior dominant rhythm consists of 8Hz  activity of moderate voltage (25-35 uV) seen predominantly in posterior head regions, symmetric and reactive to eye opening and eye closing. Sleep was characterized by vertex waves, sleep spindles (12 to 14 Hz), maximal frontocentral region. Hyperventilation and photic stimulation were not performed.     IMPRESSION: This study is within normal limits. No seizures or epileptiform discharges were seen throughout the recording.  Nalia Honeycutt Barbra Sarks

## 2021-01-26 NOTE — ED Notes (Signed)
Breakfast Orders placed 

## 2021-01-26 NOTE — Care Management CC44 (Signed)
Condition Code 44 Documentation Completed  Patient Details  Name: Lawrence [name] MRN: [id number] Date of Birth: 20-Jul-1956   Condition Code 44 given:  Yes Patient signature on Condition Code 44 notice:  Yes Documentation of 2 MD's agreement:  Yes Code 44 added to claim:  Yes    Pollie Friar, RN 01/26/2021, 12:21 PM

## 2021-01-26 NOTE — Evaluation (Signed)
Occupational Therapy Evaluation Patient Details Name: Lawrence [name] MRN: [id number] DOB: 07/09/1956 Today's Date: 01/26/2021    History of Present Illness 65 y.o. male presenting with waxing/waning dysarthria and L-sided weakness. CT, MRI and EEG (-). Further work-up pending. PMHx significant for DMII, HTN, COPD, diastolic CHF, A-fib, depression, OSA and HLD.   Clinical Impression   PTA patient was living with his spouse in a 1st floor apartment with level entry and was grossly Mod I with mobility using rollator for short-distances in home. Patient reports use of scooter in community dwellings. Patient requires assist with bathing/dressing from [facility] aide  4x weekly but reports ability to complete toileting tasks with Mod I. Patient currently functioning near baseline demonstrating bed mobility with Mod I, HOB elevated and increased time/effort secondary to body habitus. Patient also demonstrates sit to stand transfers from elevated EOB and short distance mobility with Min guard and use of rollator. Patient denies L sided weakness at time of evaluation with strength, sensation and coordination WFL and symmetrical bilaterally. Patient does not require continued acute occupational therapy services at this time with OT to sign off.      Follow Up Recommendations  No OT follow up;Supervision - Intermittent    Equipment Recommendations  None recommended by OT (Patient has necessary DME.)    Recommendations for Other Services       Precautions / Restrictions Precautions Precautions: Fall Restrictions Weight Bearing Restrictions: No      Mobility Bed Mobility Overal bed mobility: Modified Independent             General bed mobility comments: HOB elevated; increased time/effort    Transfers Overall transfer level: Needs assistance Equipment used: 4-wheeled walker Transfers: Sit to/from Stand Sit to Stand: Min guard;From elevated surface         General transfer comment: Min  guard for sit to stand from elevated EOB. Increased time/effort to rise.    Balance Overall balance assessment: Needs assistance Sitting-balance support: No upper extremity supported;Feet supported Sitting balance-Leahy Scale: Good     Standing balance support: Bilateral upper extremity supported;During functional activity Standing balance-Leahy Scale: Fair Standing balance comment: Reliant on BUE on rollator secondary to pain in RLE.                           ADL either performed or assessed with clinical judgement   ADL Overall ADL's : Needs assistance/impaired                     Lower Body Dressing: Moderate assistance;Sit to/from stand Lower Body Dressing Details (indicate cue type and reason): Max A to don footwear seated EOB. Toilet Transfer: Designer, television/film set Details (indicate cue type and reason): Simulated with transfer to [facility] with use of rollator and Min guard.         Functional mobility during ADLs: Min guard;Rolling walker General ADL Comments: Patient functioning at baseline.     Vision Baseline Vision/History:  (Contacts) Patient Visual Report: No change from baseline Vision Assessment?: No apparent visual deficits     Perception     Praxis      Pertinent Vitals/Pain Pain Assessment: Faces Faces Pain Scale: Hurts little more Pain Location: RLE (chronic) with mobility Pain Descriptors / Indicators: Other (Comment) ("bone on bone") Pain Intervention(s): Limited activity within patient's tolerance;Monitored during session     Hand Dominance Right   Extremity/Trunk Assessment Upper Extremity Assessment Upper Extremity Assessment: Overall WFL for  tasks assessed;RUE deficits/detail;LUE deficits/detail RUE Deficits / Details: AROM WFL. MMT grossly 5/5. RUE Coordination: WNL LUE Deficits / Details: Patient reports no weakness at time of eval. States weakness has been intermittent lasting for several hours at a time. AROM WFL.  MMT grossly 5/5 at time of eval. LUE Sensation: WNL LUE Coordination: WNL (slow)   Lower Extremity Assessment Lower Extremity Assessment: Defer to PT evaluation   Cervical / Trunk Assessment Cervical / Trunk Assessment: Other exceptions Cervical / Trunk Exceptions: Body habitus   Communication Communication Communication: No difficulties   Cognition Arousal/Alertness: Awake/alert Behavior During Therapy: WFL for tasks assessed/performed Overall Cognitive Status: Within Functional Limits for tasks assessed                                     General Comments  VSS on RA.    Exercises     Shoulder Instructions      Home Living Family/patient expects to be discharged to:: Private residence Living Arrangements: Spouse/significant other Available Help at Discharge: Family;Available 24 hours/day Type of Home: Apartment Home Access: Level entry     Home Layout: One level     Bathroom Shower/Tub: Teacher, early years/pre: Handicapped height Bathroom Accessibility: No   Home Equipment: Environmental consultant - 4 wheels;Electric scooter;Hospital bed;Adaptive equipment Adaptive Equipment: Reacher Additional Comments: has shower seat and bsc but they won't fit in bathroom      Prior Functioning/Environment Level of Independence: Needs assistance  Gait / Transfers Assistance Needed: Uses rollator to ambulate in apartment and electric scooter for longer distances. Does some cooking. Limited household ambulator reporting walking up to 34ft at a time. ADL's / Homemaking Assistance Needed: HHaide MTW(3hrs) and F(4hrs); assist with bathing/dressing   Comments: Neither patient nor his spouse drives        OT Problem List:        OT Treatment/Interventions:      OT Goals(Current goals can be found in the care plan section) Acute Rehab OT Goals Patient Stated Goal: To return home. OT Goal Formulation: With patient  OT Frequency:     Barriers to D/C:             Co-evaluation              AM-PAC OT "6 Clicks" Daily Activity     Outcome Measure Help from another person eating meals?: None Help from another person taking care of personal grooming?: A Little Help from another person toileting, which includes using toliet, bedpan, or urinal?: A Little Help from another person bathing (including washing, rinsing, drying)?: A Lot Help from another person to put on and taking off regular upper body clothing?: A Little Help from another person to put on and taking off regular lower body clothing?: A Lot 6 Click Score: 17   End of Session Equipment Utilized During Treatment: Gait belt;Rolling walker  Activity Tolerance: Patient tolerated treatment well Patient left: in chair;with call bell/phone within reach;with chair alarm set  OT Visit Diagnosis: Muscle weakness (generalized) (M62.81)                Time: 3976-7341 OT Time Calculation (min): 17 min Charges:  OT General Charges $OT Visit: 1 Visit OT Evaluation $OT Eval Low Complexity: 1 Low  Norene Oliveri H. OTR/L Supplemental OT, Department of rehab services [phone]  Avelino Herren R H. 01/26/2021, 2:07 PM

## 2021-01-26 NOTE — Progress Notes (Signed)
EEG complete - results pending 

## 2021-01-26 NOTE — Discharge Summary (Signed)
DISCHARGE SUMMARY  Lawrence [name]  MR#: [id number]  DOB:Jan 09, 1956  Date of Admission: 01/25/2021 Date of Discharge: 01/26/2021  Attending Physician:Lawrence Hush Hennie Duos, MD  Patient's KGS:[id number], Lawrence Sousa, PA-C  Consults:  Stroke Team   Disposition: D/C home    Follow-up Appts:  Follow-up Information     Lawrence Stalker, PA-C. Schedule an appointment as soon as possible for a visit in 1 week(s).   Specialty: Family Medicine Contact information: [address] [place] [address] [phone]                 Discharge Diagnoses: Acute onset transient left-sided weakness - suspected TIA  Seizure disorder Chronic paroxysmal atrial fibrillation HTN Chronic grade 2 diastolic CHF OSA on home BiPAP COPD DM2 uncontrolled with diabetic polyneuropathy Moderate aortic stenosis    Initial presentation: 64yo with a history of DM2, HTN, COPD, diastolic congestive heart failure (grade 2 via TTE June 2022), paroxysmal atrial fibrillation, depression, moderate aortic stenosis, OSA on home BiPAP, gout, and HLD who presented to the ED with the acute onset of intermittent slurred speech and left-sided weakness x48 hours.  Hospital Course: Acute onset left-sided weakness Full stroke work-up completed by stroke team - CT head and CTa head and neck unrevealing - MRI brain without acute findings - practiced permissive hypertension - cleared for d/c home per Stroke Team - to continue Eliquis - f/u w/ Neuro as per Stroke Team recommendation    Seizure disorder Possible observed seizure episodes during a hospitalization in March -patient was discharged home on keppra at that time but stopped taking it - EEG this admit w/o evidence of acute seizure - cont lyrica as per Neuro recommendations    Chronic paroxysmal atrial fibrillation Continue usual beta-blocker dosing - resume Eliquis at time of d/c    HTN Permissive hypertension practiced during admit - resume usual BP meds  at time of d/c    Chronic grade 2 diastolic CHF Euvolemic during this admit    OSA on home BiPAP Continue usual home regimen   COPD Well compensated during this admit    DM2 uncontrolled with diabetic polyneuropathy CBG above goal at 209 - A1c pending   Moderate aortic stenosis    Allergies as of 01/26/2021       Reactions   Vancomycin Other (See Comments)   "Red Man Syndrome" 02/02/17: possible cause for rash under both arms   Niacin Other (See Comments)   Niacin And Related Other (See Comments)   Red man syndrome   Tubersol [tuberculin] Other (See Comments)   Reaction unknown   Doxycycline Rash, Other (See Comments)        Medication List     STOP taking these medications    acetaminophen 650 MG CR tablet Commonly known as: TYLENOL Replaced by: acetaminophen 325 MG tablet   furosemide 80 MG tablet Commonly known as: LASIX       TAKE these medications    Accu-Chek FastClix Lancets Misc 1 each by Other route in the morning and at bedtime.   acetaminophen 325 MG tablet Commonly known as: TYLENOL Take 2 tablets (650 mg total) by mouth every 4 (four) hours as needed for mild pain (or temp > 37.5 C (99.5 F)). Replaces: acetaminophen 650 MG CR tablet   albuterol 108 (90 Base) MCG/ACT inhaler Commonly known as: VENTOLIN HFA Inhale 2 puffs into the lungs every 6 (six) hours as needed for wheezing or shortness of breath.   Alcohol Prep 70 % Pads 1 each by Other  route in the morning, at noon, and at bedtime.   allopurinol 100 MG tablet Commonly known as: ZYLOPRIM Take 100 mg by mouth 2 (two) times daily.   apixaban 5 MG Tabs tablet Commonly known as: ELIQUIS Take 1 tablet (5 mg total) by mouth 2 (two) times daily.   ARIPiprazole 5 MG tablet Commonly known as: ABILIFY Take 1 tablet (5 mg total) by mouth at bedtime.   atorvastatin 40 MG tablet Commonly known as: LIPITOR TAKE ONE TABLET BY MOUTH DAILY AT 6PM What changed:  how much to take how to  take this when to take this additional instructions   B-D ULTRAFINE III SHORT PEN 31G X 8 MM Misc Generic drug: Insulin Pen Needle 1 each by Other route See admin instructions. Use to inject insulin subcutaneously at bedtime as directed.   blood glucose meter kit and supplies Dispense based on patient and insurance preference. Use up to four times daily as directed. (FOR ICD-10 E10.9, E11.9). What changed:  how much to take how to take this when to take this   busPIRone 5 MG tablet Commonly known as: BUSPAR Take 1 tablet (5 mg total) by mouth 3 (three) times daily.   cetirizine 10 MG tablet Commonly known as: ZYRTEC Take 10 mg by mouth at bedtime.   dapagliflozin propanediol 10 MG Tabs tablet Commonly known as: Farxiga Take 1 tablet (10 mg total) by mouth daily before breakfast.   ferrous sulfate 324 (65 Fe) MG Tbec Take 324 mg by mouth daily with breakfast.   fluticasone 50 MCG/ACT nasal spray Commonly known as: FLONASE Place 2 sprays into both nostrils daily.   isosorbide mononitrate 30 MG 24 hr tablet Commonly known as: IMDUR Take 1 tablet (30 mg total) by mouth daily.   Lantus SoloStar 100 UNIT/ML Solostar Pen Generic drug: insulin glargine Inject 12 Units into the skin every morning.   metFORMIN 500 MG tablet Commonly known as: GLUCOPHAGE Take 1,000 mg by mouth 2 (two) times daily with a meal.   metoprolol succinate 25 MG 24 hr tablet Commonly known as: TOPROL-XL Take 1 tablet (25 mg total) by mouth daily.   omeprazole 20 MG capsule Commonly known as: PRILOSEC Take 20 mg by mouth every morning.   oxyCODONE 5 MG immediate release tablet Commonly known as: Oxy IR/ROXICODONE Take 5 mg by mouth every 8 (eight) hours as needed for severe pain.   potassium chloride SA 20 MEQ tablet Commonly known as: KLOR-CON Take 2 tablets (40 mEq total) by mouth daily.   prazosin 2 MG capsule Commonly known as: MINIPRESS Take 2 capsules (4 mg total) by mouth at  bedtime.   pregabalin 100 MG capsule Commonly known as: LYRICA Take 100 mg by mouth in the morning, at noon, and at bedtime.   silver sulfADIAZINE 1 % cream Commonly known as: SILVADENE Apply 1 application topically See admin instructions. Apply as directed once a day as directed to bilateral legs   spironolactone 25 MG tablet Commonly known as: ALDACTONE Take 1 tablet (25 mg total) by mouth daily.   temazepam 15 MG capsule Commonly known as: RESTORIL Take 1 capsule (15 mg total) by mouth at bedtime.   torsemide 20 MG tablet Commonly known as: Demadex Take 2 tablets (40 mg total) in the morning and 1 tablet (20 mg total) in the evening.   traZODone 100 MG tablet Commonly known as: DESYREL Take 2 tablets (200 mg total) by mouth at bedtime.   Trulicity 4.5 IR/6.7EL Sopn Generic drug: Dulaglutide  Inject 4.5 mg as directed once a week.   venlafaxine XR 75 MG 24 hr capsule Commonly known as: EFFEXOR-XR Take 3 capsules (225 mg total) by mouth daily with breakfast.   Vitamin D (Ergocalciferol) 1.25 MG (50000 UNIT) Caps capsule Commonly known as: DRISDOL Take 50,000 Units by mouth every 7 (seven) days.        Day of Discharge BP 120/80 (BP Location: Right Arm)   Pulse 74   Temp 98.6 F (37 C) (Oral)   Resp 16   Ht $R'6\' 2"'ny$  (1.88 m)   Wt (!) 176 kg   SpO2 94%   BMI 49.82 kg/m   Physical Exam: General: No acute respiratory distress Lungs: Clear to auscultation bilaterally without wheezes or crackles Cardiovascular: Regular rate and rhythm without murmur gallop or rub normal S1 and S2 Abdomen: Nontender, nondistended, soft, bowel sounds positive, no rebound, no ascites, no appreciable mass Extremities: No significant cyanosis, clubbing, or edema bilateral lower extremities  Basic Metabolic Panel: Recent Labs  Lab 01/25/21 1819 01/25/21 1834  NA 137 139  K 3.7 3.7  CL 95* 98  CO2 29  --   GLUCOSE 214* 214*  BUN 18 20  CREATININE 1.01 0.90  CALCIUM 9.8  --      Liver Function Tests: Recent Labs  Lab 01/25/21 1819  AST 19  ALT 23  ALKPHOS 100  BILITOT 0.8  PROT 8.1  ALBUMIN 4.1    Coags: Recent Labs  Lab 01/25/21 1819  INR 1.0    CBC: Recent Labs  Lab 01/25/21 1819 01/25/21 1834  WBC 13.1*  --   NEUTROABS 10.0*  --   HGB 14.3 15.0  HCT 44.3 44.0  MCV 91.5  --   PLT 324  --     BNP (last 3 results) Recent Labs    10/16/20 2343 10/22/20 0345 11/13/20 1528  BNP 61.4 36.3 40.7    CBG: Recent Labs  Lab 01/25/21 1819 01/25/21 2142 01/26/21 0804 01/26/21 1145 01/26/21 1633  GLUCAP 198* 185* 209* 206* 185*    Recent Results (from the past 240 hour(s))  Resp Panel by RT-PCR (Flu A&B, Covid) Nasopharyngeal Swab     Status: None   Collection Time: 01/25/21  6:19 PM   Specimen: Nasopharyngeal Swab; Nasopharyngeal(NP) swabs in vial transport medium  Result Value Ref Range Status   SARS Coronavirus 2 by RT PCR NEGATIVE NEGATIVE Final    Comment: (NOTE) SARS-CoV-2 target nucleic acids are NOT DETECTED.  The SARS-CoV-2 RNA is generally detectable in upper respiratory specimens during the acute phase of infection. The lowest concentration of SARS-CoV-2 viral copies this assay can detect is 138 copies/mL. A negative result does not preclude SARS-Cov-2 infection and should not be used as the sole basis for treatment or other patient management decisions. A negative result may occur with  improper specimen collection/handling, submission of specimen other than nasopharyngeal swab, presence of viral mutation(s) within the areas targeted by this assay, and inadequate number of viral copies(<138 copies/mL). A negative result must be combined with clinical observations, patient history, and epidemiological information. The expected result is Negative.  Fact Sheet for Patients:  EntrepreneurPulse.com.au  Fact Sheet for Healthcare Providers:  IncredibleEmployment.be  This test is  no t yet approved or cleared by the [place] FDA and  has been authorized for detection and/or diagnosis of SARS-CoV-2 by FDA under an Emergency Use Authorization (EUA). This EUA will remain  in effect (meaning this test can be used) for the duration of  the COVID-19 declaration under Section 564(b)(1) of the Act, 21 U.S.C.section 360bbb-3(b)(1), unless the authorization is terminated  or revoked sooner.       Influenza A by PCR NEGATIVE NEGATIVE Final   Influenza B by PCR NEGATIVE NEGATIVE Final    Comment: (NOTE) The Xpert Xpress SARS-CoV-2/FLU/RSV plus assay is intended as an aid in the diagnosis of influenza from Nasopharyngeal swab specimens and should not be used as a sole basis for treatment. Nasal washings and aspirates are unacceptable for Xpert Xpress SARS-CoV-2/FLU/RSV testing.  Fact Sheet for Patients: EntrepreneurPulse.com.au  Fact Sheet for Healthcare Providers: IncredibleEmployment.be  This test is not yet approved or cleared by the [place] FDA and has been authorized for detection and/or diagnosis of SARS-CoV-2 by FDA under an Emergency Use Authorization (EUA). This EUA will remain in effect (meaning this test can be used) for the duration of the COVID-19 declaration under Section 564(b)(1) of the Act, 21 U.S.C. section 360bbb-3(b)(1), unless the authorization is terminated or revoked.  Performed at [facility], [place] [address] [place], [place] [address]       Time spent in discharge (includes decision making & examination of pt): 35 minutes  01/26/2021, 4:42 PM   Cherene Altes, MD [facility] Office  [phone]

## 2021-01-26 NOTE — Progress Notes (Signed)
Neurology Progress Note  S: No overnight events. Patient has remained in ED hallway, but now has a bed on 3W. He states his weakness is improved and he only feels weak in the LLE today. He is able to lift his LLE about 4 inches which is improved over yesterday. He relates that he had back trouble a few years back which caused him to fall. He had PT for awhile at home, and he returned to normal gait with walker. He is able to bear weight on his LLE. He sometimes feel tingling in Right upper 2nd, 3rd fingers, but none on the left. The sensory changes yesterday, are not present today except in the ankle/foot area bilaterally which is attributed to peripheral DM neuropathy. No other complaints.   O: Current vital signs: BP 125/75 (BP Location: Right Arm)   Pulse 78   Temp 98 F (36.7 C) (Oral)   Resp 15   Ht $R'6\' 2"'NT$  (1.88 m)   Wt (!) 176 kg   SpO2 95%   BMI 49.82 kg/m  Vital signs in last 24 hours: Temp:  [98 F (36.7 C)-98.4 F (36.9 C)] 98 F (36.7 C) (06/21 0802) Pulse Rate:  [68-108] 78 (06/21 0802) Resp:  [15-24] 15 (06/21 0802) BP: (95-134)/(62-82) 125/75 (06/21 0802) SpO2:  [90 %-97 %] 95 % (06/21 0802) Weight:  [176 kg] 176 kg (06/20 1821)  GENERAL: Awake, alert in NAD. Morbidly obese.  HEENT: Normocephalic and atraumatic LUNGS: Normal respiratory effort.  CV: RRR. Ext: warm. Brownish discoloration to BLE lower tibia to feet.   NEURO:  Mental Status: AA&Ox3  Speech/Language: speech is without aphasia or dysarthria.  Naming, repetition, fluency, and comprehension intact.  Cranial Nerves:  II: PERRL. Visual fields full.  III, IV, VI: EOMI. Eyelids elevate symmetrically.  V: Sensation is intact to light touch and symmetrical to face.  VII: Smile is symmetrical.   VIII: hearing intact to voice. IX, X: Palate elevates symmetrically. Phonation is normal.  DU:KGURKYHC shrug 5/5. XII: tongue is midline without fasciculations. Motor: 5/5 strength to Bilateral biceps, triceps,  and grip. Strength to LEs is 5/5 on the right. Able to lift LLE off bed with resistance to gravity.  Tone: is normal and bulk is increased.  Sensation- Intact to light touch bilaterally. Extinction absent to light touch to DSS. No sensory level with light touch from upper thigh to upper chest.   DTRs: 1+ to brachioradialis and 0 to patellas.  Gait- deferred  Medications  Current Facility-Administered Medications:     stroke: mapping our early stages of recovery book, , Does not apply, Once, Shalhoub, Sherryll Burger, MD   acetaminophen (TYLENOL) tablet 650 mg, 650 mg, Oral, Q4H PRN, 650 mg at 01/26/21 0414 **OR** acetaminophen (TYLENOL) 160 MG/5ML solution 650 mg, 650 mg, Per Tube, Q4H PRN **OR** acetaminophen (TYLENOL) suppository 650 mg, 650 mg, Rectal, Q4H PRN, Shalhoub, Sherryll Burger, MD   albuterol (PROVENTIL) (2.5 MG/3ML) 0.083% nebulizer solution 3 mL, 3 mL, Inhalation, Q6H PRN, Shalhoub, Sherryll Burger, MD   allopurinol (ZYLOPRIM) tablet 100 mg, 100 mg, Oral, BID, Shalhoub, Sherryll Burger, MD, 100 mg at 01/26/21 0016   ARIPiprazole (ABILIFY) tablet 5 mg, 5 mg, Oral, QHS, Shalhoub, Sherryll Burger, MD, 5 mg at 01/26/21 0020   aspirin EC tablet 81 mg, 81 mg, Oral, Daily, Shalhoub, Sherryll Burger, MD   atorvastatin (LIPITOR) tablet 40 mg, 40 mg, Oral, QPM, Shalhoub, Sherryll Burger, MD, 40 mg at 01/26/21 0127   busPIRone (BUSPAR) tablet 5 mg, 5 mg,  Oral, TID, Shalhoub, Sherryll Burger, MD, 5 mg at 01/26/21 0016   enoxaparin (LOVENOX) injection 40 mg, 40 mg, Subcutaneous, Q24H, Shalhoub, Sherryll Burger, MD   ferrous sulfate tablet 324 mg, 324 mg, Oral, Q breakfast, Shalhoub, Sherryll Burger, MD   fluticasone (FLONASE) 50 MCG/ACT nasal spray 2 spray, 2 spray, Each Nare, Daily, Shalhoub, Sherryll Burger, MD   hydrALAZINE (APRESOLINE) injection 10 mg, 10 mg, Intravenous, Q6H PRN, Shalhoub, Sherryll Burger, MD   insulin aspart (novoLOG) injection 0-20 Units, 0-20 Units, Subcutaneous, TID AC & HS, Shalhoub, Sherryll Burger, MD, 3 Units at 01/25/21 2300   insulin glargine  (LANTUS) injection 12 Units, 12 Units, Subcutaneous, Daily, Shalhoub, Sherryll Burger, MD   loratadine (CLARITIN) tablet 10 mg, 10 mg, Oral, QHS, Shalhoub, Sherryll Burger, MD, 10 mg at 01/26/21 0016   ondansetron (ZOFRAN) injection 4 mg, 4 mg, Intravenous, Q6H PRN, Shalhoub, Sherryll Burger, MD   oxyCODONE (Oxy IR/ROXICODONE) immediate release tablet 5 mg, 5 mg, Oral, Q8H PRN, Shalhoub, Sherryll Burger, MD, 5 mg at 01/26/21 0127   pantoprazole (PROTONIX) EC tablet 40 mg, 40 mg, Oral, Daily, Shalhoub, Sherryll Burger, MD   polyethylene glycol (MIRALAX / GLYCOLAX) packet 17 g, 17 g, Oral, Daily PRN, Shalhoub, Sherryll Burger, MD   potassium chloride SA (KLOR-CON) CR tablet 40 mEq, 40 mEq, Oral, Daily, Shalhoub, Sherryll Burger, MD, 40 mEq at 01/26/21 0127   prazosin (MINIPRESS) capsule 4 mg, 4 mg, Oral, QHS, Shalhoub, Sherryll Burger, MD, 4 mg at 01/26/21 0017   pregabalin (LYRICA) capsule 100 mg, 100 mg, Oral, TID, Shalhoub, Sherryll Burger, MD, 100 mg at 01/26/21 0016   temazepam (RESTORIL) capsule 15 mg, 15 mg, Oral, QHS, Shalhoub, Sherryll Burger, MD, 15 mg at 01/26/21 0016   torsemide (DEMADEX) tablet 20 mg, 20 mg, Oral, QHS, Shalhoub, Sherryll Burger, MD   torsemide [facility]) tablet 40 mg, 40 mg, Oral, Daily, Shalhoub, Sherryll Burger, MD   traZODone (DESYREL) tablet 200 mg, 200 mg, Oral, QHS, Shalhoub, Sherryll Burger, MD, 200 mg at 01/26/21 0016  Current Outpatient Medications:    Accu-Chek FastClix Lancets MISC, 1 each by Other route in the morning and at bedtime., Disp: , Rfl:    acetaminophen (TYLENOL) 650 MG CR tablet, Take 1,300 mg by mouth every 8 (eight) hours as needed for pain., Disp: , Rfl:    albuterol (VENTOLIN HFA) 108 (90 Base) MCG/ACT inhaler, Inhale 2 puffs into the lungs every 6 (six) hours as needed for wheezing or shortness of breath., Disp: , Rfl:    Alcohol Swabs (ALCOHOL PREP) 70 % PADS, 1 each by Other route in the morning, at noon, and at bedtime., Disp: , Rfl:    allopurinol (ZYLOPRIM) 100 MG tablet, Take 100 mg by mouth 2 (two) times daily., Disp: , Rfl:     apixaban (ELIQUIS) 5 MG TABS tablet, Take 1 tablet (5 mg total) by mouth 2 (two) times daily., Disp: 180 tablet, Rfl: 2   ARIPiprazole (ABILIFY) 5 MG tablet, Take 1 tablet (5 mg total) by mouth at bedtime., Disp: 30 tablet, Rfl: 2   atorvastatin (LIPITOR) 40 MG tablet, TAKE ONE TABLET BY MOUTH DAILY AT 6PM (Patient taking differently: Take 40 mg by mouth every evening.), Disp: 90 tablet, Rfl: 2   B-D ULTRAFINE III SHORT PEN 31G X 8 MM MISC, 1 each by Other route See admin instructions. Use to inject insulin subcutaneously at bedtime as directed., Disp: , Rfl:    blood glucose meter kit and supplies, Dispense based on patient and insurance  preference. Use up to four times daily as directed. (FOR ICD-10 E10.9, E11.9). (Patient taking differently: 1 each by Other route See admin instructions. Dispense based on patient and insurance preference. Use up to four times daily as directed. (FOR ICD-10 E10.9, E11.9).), Disp: 1 each, Rfl: 0   busPIRone (BUSPAR) 5 MG tablet, Take 1 tablet (5 mg total) by mouth 3 (three) times daily., Disp: 270 tablet, Rfl: 1   cetirizine (ZYRTEC) 10 MG tablet, Take 10 mg by mouth at bedtime. , Disp: , Rfl:    dapagliflozin propanediol (FARXIGA) 10 MG TABS tablet, Take 1 tablet (10 mg total) by mouth daily before breakfast., Disp: 90 tablet, Rfl: 3   Dulaglutide (TRULICITY) 4.5 QT/6.2UQ SOPN, Inject 4.5 mg as directed once a week., Disp: 4.5 mL, Rfl: 3   ferrous sulfate 324 (65 Fe) MG TBEC, Take 324 mg by mouth daily with breakfast. , Disp: , Rfl:    fluticasone (FLONASE) 50 MCG/ACT nasal spray, Place 2 sprays into both nostrils daily. , Disp: , Rfl:    furosemide (LASIX) 80 MG tablet, Take 80-160 mg by mouth in the morning and at bedtime., Disp: , Rfl:    isosorbide mononitrate (IMDUR) 30 MG 24 hr tablet, Take 1 tablet (30 mg total) by mouth daily., Disp: 90 tablet, Rfl: 2   LANTUS SOLOSTAR 100 UNIT/ML Solostar Pen, Inject 12 Units into the skin every morning., Disp: 15 mL, Rfl:  3   metFORMIN (GLUCOPHAGE) 500 MG tablet, Take 1,000 mg by mouth 2 (two) times daily with a meal., Disp: , Rfl:    metoprolol succinate (TOPROL-XL) 25 MG 24 hr tablet, Take 1 tablet (25 mg total) by mouth daily., Disp: 90 tablet, Rfl: 2   omeprazole (PRILOSEC) 20 MG capsule, Take 20 mg by mouth every morning., Disp: , Rfl:    oxyCODONE (OXY IR/ROXICODONE) 5 MG immediate release tablet, Take 5 mg by mouth every 8 (eight) hours as needed for severe pain., Disp: , Rfl:    potassium chloride SA (KLOR-CON) 20 MEQ tablet, Take 2 tablets (40 mEq total) by mouth daily., Disp: 180 tablet, Rfl: 2   prazosin (MINIPRESS) 2 MG capsule, Take 2 capsules (4 mg total) by mouth at bedtime., Disp: 180 capsule, Rfl: 2   pregabalin (LYRICA) 100 MG capsule, Take 100 mg by mouth in the morning, at noon, and at bedtime. , Disp: , Rfl:    silver sulfADIAZINE (SILVADENE) 1 % cream, Apply 1 application topically See admin instructions. Apply as directed once a day as directed to bilateral legs, Disp: , Rfl:    spironolactone (ALDACTONE) 25 MG tablet, Take 1 tablet (25 mg total) by mouth daily., Disp: 90 tablet, Rfl: 2   temazepam (RESTORIL) 15 MG capsule, Take 1 capsule (15 mg total) by mouth at bedtime., Disp: 30 capsule, Rfl: 1   torsemide (DEMADEX) 20 MG tablet, Take 2 tablets (40 mg total) in the morning and 1 tablet (20 mg total) in the evening., Disp: 270 tablet, Rfl: 2   traZODone (DESYREL) 100 MG tablet, Take 2 tablets (200 mg total) by mouth at bedtime., Disp: 180 tablet, Rfl: 3   venlafaxine XR (EFFEXOR-XR) 75 MG 24 hr capsule, Take 3 capsules (225 mg total) by mouth daily with breakfast., Disp: 270 capsule, Rfl: 3   Vitamin D, Ergocalciferol, (DRISDOL) 1.25 MG (50000 UNIT) CAPS capsule, Take 50,000 Units by mouth every 7 (seven) days., Disp: , Rfl:   Pertinent Labs LDL 50. Glucose 209. HIV NR. CRP 1.4.  New Imaging: MRI  Brain  Negative brain MRI for age. No acute intracranial abnormality identified. EEG:  negative for epileptiform discharges or seizures.   Assessment: 65 yo morbidly obese male who came in by ambulance yesterday for slurred speech. Dysarthria had arrived and given no other neurological finding, patient was taken to triage. In triage, he began to have left sided weakness and a code stroke was called. Exam improved, MRI Brain negative. In the past he has had R sided shaking with seizures, never had any left sided symtpomts. Denies any seizure like activity. rEEG negative.   Impression: Likely TIA due to a potential lacune.   Plan:  - PT and OT - Follow up with stroke clinic outpatient.   Pt seen by Clance Boll, MSN, APN-BC/Nurse Practitioner/Neuro and later by MD. Note and plan to be edited as needed by MD.  Pager: [phone]   NEUROHOSPITALIST ADDENDUM Performed a face to face diagnostic evaluation.   I have reviewed the contents of history and physical exam as documented by PA/ARNP/Resident and agree with above documentation.  I have discussed and formulated the above plan as documented. Edits to the note have been made as needed.  Impression/Key exam findings/Plan: 16 M with episode of dysarthria + L sided weakness persistent for a few hours, then improved and now completely resolved on my assessment. Likely this was a TIA due to a lacune. I do not think this is Eliquis failure. Althou Eliquis does not provide significant protection against small vessel disease, I do not think that adding antiplatelet agent at this time will be helpful due to increased risk of bleeding.  Recommend risk factor modification with weight loss, DM2, HN control.  Follow up with stroke clinic outpatient.  Donnetta Simpers, MD [facility] [phone]   If 7pm to 7am, please call on call as listed on AMION.

## 2021-01-26 NOTE — Progress Notes (Signed)
Pt was seen for mobility from the chair after sitting a lengthy time since his OT earlier.  He is stiff and in pain on B knees, R knee especially an issue\ with end stage OA.  Will work with pt to find a solution for mobility as he feels he cannot use the power scooter in his house.  Request a brace fitting for posterolateral instability on R knee, due to increased fall risk.  Follow acutely for goals of PT.  01/26/21 1500  PT Visit Information  Last PT Received On 01/26/21  Assistance Needed +1  History of Present Illness 65 y.o. male presenting with waxing/waning dysarthria and L-sided weakness. CT, MRI and EEG (-). Further work-up pending. PMHx significant for DMII, HTN, COPD, diastolic CHF, A-fib, depression, OSA and HLD.  Precautions  Precautions Fall  Restrictions  Weight Bearing Restrictions No  Home Living  Family/patient expects to be discharged to: Private residence  Living Arrangements Spouse/significant other  Available Help at Discharge Family;Available 24 hours/day  Type of Home Apartment  Home Access Level entry  Home Layout One level  Bathroom Shower/Tub Tub/shower unit  Bathroom Toilet Handicapped height  Bathroom Accessibility No  [place] - 4 wheels;Electric scooter;Hospital bed;Journalist, newspaper  Additional Comments has shower seat and bsc but they won't fit in bathroom  Prior Function  Level of Independence Needs assistance  Gait / Transfers Assistance Needed walks moderate distances with rollator, power scooter for out  ADL's / Homemaking Assistance Needed HHaide MTW(3hrs) and F(4hrs); assist with bathing/dressing  Comments does not drive, spouse not driving as well  Communication  Communication No difficulties  Pain Assessment  Pain Assessment Faces  Pain Score 6  Faces Pain Scale 6  Pain Location R knee pain  Pain Descriptors / Indicators  (R knee is bone on bone)  Pain Intervention(s) Limited activity within  patient's tolerance;Monitored during session;Repositioned;Patient requesting pain meds-RN notified  Cognition  Arousal/Alertness Awake/alert  Behavior During Therapy WFL for tasks assessed/performed  Overall Cognitive Status Within Functional Limits for tasks assessed  Upper Extremity Assessment  Upper Extremity Assessment Defer to OT evaluation  Lower Extremity Assessment  Lower Extremity Assessment Generalized weakness  Cervical / Trunk Assessment  Cervical / Trunk Assessment Other exceptions  Cervical / Trunk Exceptions Body habitus  Bed Mobility  Overal bed mobility Modified Independent  Transfers  Overall transfer level Needs assistance  Equipment used Rolling walker (2 wheeled)  Transfers Sit to/from Stand  Sit to Stand Min assist;Mod assist  General transfer comment Needed more assist due to sitting awhile after OT assisted to chair  Ambulation/Gait  Ambulation/Gait assistance Min guard;Min assist  Gait Distance (Feet) 16 Feet  Assistive device Rolling walker (2 wheeled);1 person hand held assist  Gait Pattern/deviations Step-through pattern;Step-to pattern;Decreased stride length;Wide base of support  General Gait Details declined to back up to chair and instead turned to go back to chair  Gait velocity reduced  Gait velocity interpretation <1.31 ft/sec, indicative of household ambulator  Balance  Overall balance assessment Needs assistance  Sitting-balance support Feet supported  Sitting balance-Leahy Scale Good  Standing balance support Bilateral upper extremity supported;During functional activity  Standing balance-Leahy Scale Fair  Exercises  Exercises Other exercises (BLE's 4- to 4+)  PT - End of Session  Equipment Utilized During Treatment Gait belt  Activity Tolerance Patient tolerated treatment well;Patient limited by fatigue  Patient left in chair;with call bell/phone within reach;with chair alarm set  Nurse Communication Mobility status  PT  Assessment  PT  Recommendation/Assessment Patient needs continued PT services  PT Visit Diagnosis Unsteadiness on feet (R26.81);Muscle weakness (generalized) (M62.81);Difficulty in walking, not elsewhere classified (R26.2);Pain  Pain - Right/Left Right  Pain - part of body Knee  PT Problem List Decreased strength;Decreased range of motion;Decreased activity tolerance;Decreased balance;Decreased mobility;Decreased coordination;Decreased knowledge of use of DME;Cardiopulmonary status limiting activity;Obesity;Pain  Barriers to Discharge Inaccessible home environment;Decreased caregiver support  Barriers to Discharge Comments pt is limited for control of R knee due to joint instability  PT Plan  PT Frequency (ACUTE ONLY) Min 3X/week  AM-PAC PT "6 Clicks" Mobility Outcome Measure (Version 2)  Help needed turning from your back to your side while in a flat bed without using bedrails? 2  Help needed moving from lying on your back to sitting on the side of a flat bed without using bedrails? 2  Help needed moving to and from a bed to a chair (including a wheelchair)? 2  Help needed standing up from a chair using your arms (e.g., wheelchair or bedside chair)? 2  Help needed to walk in hospital room? 3  Help needed climbing 3-5 steps with a railing?  2  6 Click Score 13  Consider Recommendation of Discharge To: CIR/SNF/LTACH  PT Recommendation  Follow Up Recommendations SNF  PT equipment Rolling walker with 5" wheels  Individuals Consulted  Consulted and Agree with Results and Recommendations Patient  Acute Rehab PT Goals  Patient Stated Goal To return home.  PT Goal Formulation With patient  Time For Goal Achievement 02/09/21  Potential to Achieve Goals Good  PT Time Calculation  PT Start Time (ACUTE ONLY) 1504  PT Stop Time (ACUTE ONLY) 1539  PT Time Calculation (min) (ACUTE ONLY) 35 min  PT General Charges  $$ ACUTE PT VISIT 1 Visit  PT Evaluation  $PT Eval Moderate Complexity 1 Mod  PT Treatments   $Gait Training 8-22 mins  Written Expression  Dominant Hand Right    Mee Hives, PT MS Acute Rehab Dept. Number: [place] [facility] [place]

## 2021-01-26 NOTE — Plan of Care (Signed)
  Problem: Education: Goal: Knowledge of General Education information will improve Description: Including pain rating scale, medication(s)/side effects and non-pharmacologic comfort measures Outcome: Adequate for Discharge   Problem: Health Behavior/Discharge Planning: Goal: Ability to manage health-related needs will improve Outcome: Adequate for Discharge   Problem: Clinical Measurements: Goal: Ability to maintain clinical measurements within normal limits will improve Outcome: Adequate for Discharge Goal: Will remain free from infection Outcome: Adequate for Discharge Goal: Diagnostic test results will improve Outcome: Adequate for Discharge Goal: Respiratory complications will improve Outcome: Adequate for Discharge Goal: Cardiovascular complication will be avoided Outcome: Adequate for Discharge   Problem: Activity: Goal: Risk for activity intolerance will decrease Outcome: Adequate for Discharge   Problem: Nutrition: Goal: Adequate nutrition will be maintained Outcome: Adequate for Discharge   Problem: Coping: Goal: Level of anxiety will decrease Outcome: Adequate for Discharge   Problem: Elimination: Goal: Will not experience complications related to bowel motility Outcome: Adequate for Discharge Goal: Will not experience complications related to urinary retention Outcome: Adequate for Discharge   Problem: Pain Managment: Goal: General experience of comfort will improve Outcome: Adequate for Discharge   Problem: Safety: Goal: Ability to remain free from injury will improve Outcome: Adequate for Discharge   Problem: Skin Integrity: Goal: Risk for impaired skin integrity will decrease Outcome: Adequate for Discharge   Problem: Education: Goal: Knowledge of disease or condition will improve Outcome: Adequate for Discharge Goal: Knowledge of secondary prevention will improve Outcome: Adequate for Discharge Goal: Knowledge of patient specific risk factors  addressed and post discharge goals established will improve Outcome: Adequate for Discharge Goal: Individualized Educational Video(s) Outcome: Adequate for Discharge   Problem: Coping: Goal: Will verbalize positive feelings about self Outcome: Adequate for Discharge Goal: Will identify appropriate support needs Outcome: Adequate for Discharge   Problem: Health Behavior/Discharge Planning: Goal: Ability to manage health-related needs will improve Outcome: Adequate for Discharge   Problem: Self-Care: Goal: Ability to participate in self-care as condition permits will improve Outcome: Adequate for Discharge Goal: Verbalization of feelings and concerns over difficulty with self-care will improve Outcome: Adequate for Discharge Goal: Ability to communicate needs accurately will improve Outcome: Adequate for Discharge   Problem: Nutrition: Goal: Risk of aspiration will decrease Outcome: Adequate for Discharge   Problem: Intracerebral Hemorrhage Tissue Perfusion: Goal: Complications of Intracerebral Hemorrhage will be minimized Outcome: Adequate for Discharge   Problem: Ischemic Stroke/TIA Tissue Perfusion: Goal: Complications of ischemic stroke/TIA will be minimized Outcome: Adequate for Discharge   Problem: Spontaneous Subarachnoid Hemorrhage Tissue Perfusion: Goal: Complications of Spontaneous Subarachnoid Hemorrhage will be minimized Outcome: Adequate for Discharge

## 2021-01-26 NOTE — ED Notes (Signed)
Pt transported to echo 

## 2021-01-27 ENCOUNTER — Encounter: Payer: Self-pay | Admitting: Internal Medicine

## 2021-01-27 LAB — HEMOGLOBIN A1C
Hgb A1c MFr Bld: 8 % — ABNORMAL HIGH (ref 4.8–5.6)
Mean Plasma Glucose: 183 mg/dL

## 2021-01-28 DIAGNOSIS — G4733 Obstructive sleep apnea (adult) (pediatric): Secondary | ICD-10-CM | POA: Diagnosis not present

## 2021-01-28 DIAGNOSIS — J449 Chronic obstructive pulmonary disease, unspecified: Secondary | ICD-10-CM | POA: Diagnosis not present

## 2021-01-28 DIAGNOSIS — M109 Gout, unspecified: Secondary | ICD-10-CM | POA: Diagnosis not present

## 2021-01-28 DIAGNOSIS — E871 Hypo-osmolality and hyponatremia: Secondary | ICD-10-CM | POA: Diagnosis not present

## 2021-01-28 DIAGNOSIS — E1140 Type 2 diabetes mellitus with diabetic neuropathy, unspecified: Secondary | ICD-10-CM | POA: Diagnosis not present

## 2021-01-28 DIAGNOSIS — J9601 Acute respiratory failure with hypoxia: Secondary | ICD-10-CM | POA: Diagnosis not present

## 2021-01-28 DIAGNOSIS — E876 Hypokalemia: Secondary | ICD-10-CM | POA: Diagnosis not present

## 2021-01-28 DIAGNOSIS — I2510 Atherosclerotic heart disease of native coronary artery without angina pectoris: Secondary | ICD-10-CM | POA: Diagnosis not present

## 2021-01-28 DIAGNOSIS — I130 Hypertensive heart and chronic kidney disease with heart failure and stage 1 through stage 4 chronic kidney disease, or unspecified chronic kidney disease: Secondary | ICD-10-CM | POA: Diagnosis not present

## 2021-01-28 DIAGNOSIS — I471 Supraventricular tachycardia: Secondary | ICD-10-CM | POA: Diagnosis not present

## 2021-01-28 DIAGNOSIS — E1165 Type 2 diabetes mellitus with hyperglycemia: Secondary | ICD-10-CM | POA: Diagnosis not present

## 2021-01-28 DIAGNOSIS — I872 Venous insufficiency (chronic) (peripheral): Secondary | ICD-10-CM | POA: Diagnosis not present

## 2021-01-28 DIAGNOSIS — N183 Chronic kidney disease, stage 3 unspecified: Secondary | ICD-10-CM | POA: Diagnosis not present

## 2021-01-28 DIAGNOSIS — E1151 Type 2 diabetes mellitus with diabetic peripheral angiopathy without gangrene: Secondary | ICD-10-CM | POA: Diagnosis not present

## 2021-01-28 DIAGNOSIS — E1122 Type 2 diabetes mellitus with diabetic chronic kidney disease: Secondary | ICD-10-CM | POA: Diagnosis not present

## 2021-01-28 DIAGNOSIS — R569 Unspecified convulsions: Secondary | ICD-10-CM | POA: Diagnosis not present

## 2021-01-28 DIAGNOSIS — I5033 Acute on chronic diastolic (congestive) heart failure: Secondary | ICD-10-CM | POA: Diagnosis not present

## 2021-01-28 DIAGNOSIS — E1169 Type 2 diabetes mellitus with other specified complication: Secondary | ICD-10-CM | POA: Diagnosis not present

## 2021-01-28 DIAGNOSIS — G4700 Insomnia, unspecified: Secondary | ICD-10-CM | POA: Diagnosis not present

## 2021-01-28 DIAGNOSIS — E785 Hyperlipidemia, unspecified: Secondary | ICD-10-CM | POA: Diagnosis not present

## 2021-01-29 DIAGNOSIS — G459 Transient cerebral ischemic attack, unspecified: Secondary | ICD-10-CM | POA: Diagnosis not present

## 2021-01-29 DIAGNOSIS — G63 Polyneuropathy in diseases classified elsewhere: Secondary | ICD-10-CM | POA: Diagnosis not present

## 2021-01-30 DIAGNOSIS — R079 Chest pain, unspecified: Secondary | ICD-10-CM | POA: Diagnosis not present

## 2021-01-30 DIAGNOSIS — R002 Palpitations: Secondary | ICD-10-CM | POA: Diagnosis not present

## 2021-01-30 DIAGNOSIS — R0789 Other chest pain: Secondary | ICD-10-CM | POA: Diagnosis not present

## 2021-01-30 DIAGNOSIS — R0902 Hypoxemia: Secondary | ICD-10-CM | POA: Diagnosis not present

## 2021-02-02 DIAGNOSIS — N183 Chronic kidney disease, stage 3 unspecified: Secondary | ICD-10-CM | POA: Diagnosis not present

## 2021-02-02 DIAGNOSIS — M109 Gout, unspecified: Secondary | ICD-10-CM | POA: Diagnosis not present

## 2021-02-02 DIAGNOSIS — E785 Hyperlipidemia, unspecified: Secondary | ICD-10-CM | POA: Diagnosis not present

## 2021-02-02 DIAGNOSIS — R569 Unspecified convulsions: Secondary | ICD-10-CM | POA: Diagnosis not present

## 2021-02-02 DIAGNOSIS — I872 Venous insufficiency (chronic) (peripheral): Secondary | ICD-10-CM | POA: Diagnosis not present

## 2021-02-02 DIAGNOSIS — I2510 Atherosclerotic heart disease of native coronary artery without angina pectoris: Secondary | ICD-10-CM | POA: Diagnosis not present

## 2021-02-02 DIAGNOSIS — J449 Chronic obstructive pulmonary disease, unspecified: Secondary | ICD-10-CM | POA: Diagnosis not present

## 2021-02-02 DIAGNOSIS — E1151 Type 2 diabetes mellitus with diabetic peripheral angiopathy without gangrene: Secondary | ICD-10-CM | POA: Diagnosis not present

## 2021-02-02 DIAGNOSIS — G4700 Insomnia, unspecified: Secondary | ICD-10-CM | POA: Diagnosis not present

## 2021-02-02 DIAGNOSIS — E1140 Type 2 diabetes mellitus with diabetic neuropathy, unspecified: Secondary | ICD-10-CM | POA: Diagnosis not present

## 2021-02-02 DIAGNOSIS — E1165 Type 2 diabetes mellitus with hyperglycemia: Secondary | ICD-10-CM | POA: Diagnosis not present

## 2021-02-02 DIAGNOSIS — E1122 Type 2 diabetes mellitus with diabetic chronic kidney disease: Secondary | ICD-10-CM | POA: Diagnosis not present

## 2021-02-02 DIAGNOSIS — I5033 Acute on chronic diastolic (congestive) heart failure: Secondary | ICD-10-CM | POA: Diagnosis not present

## 2021-02-02 DIAGNOSIS — I130 Hypertensive heart and chronic kidney disease with heart failure and stage 1 through stage 4 chronic kidney disease, or unspecified chronic kidney disease: Secondary | ICD-10-CM | POA: Diagnosis not present

## 2021-02-02 DIAGNOSIS — E871 Hypo-osmolality and hyponatremia: Secondary | ICD-10-CM | POA: Diagnosis not present

## 2021-02-02 DIAGNOSIS — J9601 Acute respiratory failure with hypoxia: Secondary | ICD-10-CM | POA: Diagnosis not present

## 2021-02-02 DIAGNOSIS — G4733 Obstructive sleep apnea (adult) (pediatric): Secondary | ICD-10-CM | POA: Diagnosis not present

## 2021-02-02 DIAGNOSIS — E1169 Type 2 diabetes mellitus with other specified complication: Secondary | ICD-10-CM | POA: Diagnosis not present

## 2021-02-02 DIAGNOSIS — E876 Hypokalemia: Secondary | ICD-10-CM | POA: Diagnosis not present

## 2021-02-02 DIAGNOSIS — I471 Supraventricular tachycardia: Secondary | ICD-10-CM | POA: Diagnosis not present

## 2021-02-08 ENCOUNTER — Inpatient Hospital Stay (HOSPITAL_COMMUNITY)
Admission: EM | Admit: 2021-02-08 | Discharge: 2021-02-11 | DRG: 309 | Disposition: A | Discharge status: Home Health | Payer: Medicare Other | Attending: Internal Medicine | Admitting: Internal Medicine

## 2021-02-08 ENCOUNTER — Observation Stay (HOSPITAL_COMMUNITY): Payer: Medicare Other

## 2021-02-08 ENCOUNTER — Other Ambulatory Visit: Payer: Self-pay

## 2021-02-08 ENCOUNTER — Emergency Department (HOSPITAL_COMMUNITY): Payer: Medicare Other

## 2021-02-08 DIAGNOSIS — K219 Gastro-esophageal reflux disease without esophagitis: Secondary | ICD-10-CM | POA: Diagnosis present

## 2021-02-08 DIAGNOSIS — Z7984 Long term (current) use of oral hypoglycemic drugs: Secondary | ICD-10-CM

## 2021-02-08 DIAGNOSIS — I480 Paroxysmal atrial fibrillation: Principal | ICD-10-CM | POA: Diagnosis present

## 2021-02-08 DIAGNOSIS — E876 Hypokalemia: Secondary | ICD-10-CM | POA: Diagnosis present

## 2021-02-08 DIAGNOSIS — Z7401 Bed confinement status: Secondary | ICD-10-CM | POA: Diagnosis not present

## 2021-02-08 DIAGNOSIS — L03115 Cellulitis of right lower limb: Secondary | ICD-10-CM

## 2021-02-08 DIAGNOSIS — Z6841 Body Mass Index (BMI) 40.0 and over, adult: Secondary | ICD-10-CM

## 2021-02-08 DIAGNOSIS — G4733 Obstructive sleep apnea (adult) (pediatric): Secondary | ICD-10-CM | POA: Diagnosis not present

## 2021-02-08 DIAGNOSIS — L03116 Cellulitis of left lower limb: Secondary | ICD-10-CM | POA: Diagnosis present

## 2021-02-08 DIAGNOSIS — R29810 Facial weakness: Secondary | ICD-10-CM | POA: Diagnosis present

## 2021-02-08 DIAGNOSIS — M4802 Spinal stenosis, cervical region: Secondary | ICD-10-CM | POA: Diagnosis present

## 2021-02-08 DIAGNOSIS — E7800 Pure hypercholesterolemia, unspecified: Secondary | ICD-10-CM | POA: Diagnosis present

## 2021-02-08 DIAGNOSIS — F4310 Post-traumatic stress disorder, unspecified: Secondary | ICD-10-CM | POA: Diagnosis present

## 2021-02-08 DIAGNOSIS — Z794 Long term (current) use of insulin: Secondary | ICD-10-CM | POA: Diagnosis not present

## 2021-02-08 DIAGNOSIS — J449 Chronic obstructive pulmonary disease, unspecified: Secondary | ICD-10-CM | POA: Diagnosis not present

## 2021-02-08 DIAGNOSIS — E1169 Type 2 diabetes mellitus with other specified complication: Secondary | ICD-10-CM

## 2021-02-08 DIAGNOSIS — F32A Depression, unspecified: Secondary | ICD-10-CM | POA: Diagnosis present

## 2021-02-08 DIAGNOSIS — I2510 Atherosclerotic heart disease of native coronary artery without angina pectoris: Secondary | ICD-10-CM | POA: Diagnosis not present

## 2021-02-08 DIAGNOSIS — G4700 Insomnia, unspecified: Secondary | ICD-10-CM | POA: Diagnosis not present

## 2021-02-08 DIAGNOSIS — M47812 Spondylosis without myelopathy or radiculopathy, cervical region: Secondary | ICD-10-CM | POA: Diagnosis not present

## 2021-02-08 DIAGNOSIS — E1142 Type 2 diabetes mellitus with diabetic polyneuropathy: Secondary | ICD-10-CM

## 2021-02-08 DIAGNOSIS — Z888 Allergy status to other drugs, medicaments and biological substances status: Secondary | ICD-10-CM

## 2021-02-08 DIAGNOSIS — I5032 Chronic diastolic (congestive) heart failure: Secondary | ICD-10-CM | POA: Diagnosis present

## 2021-02-08 DIAGNOSIS — I509 Heart failure, unspecified: Secondary | ICD-10-CM | POA: Diagnosis not present

## 2021-02-08 DIAGNOSIS — Z743 Need for continuous supervision: Secondary | ICD-10-CM | POA: Diagnosis not present

## 2021-02-08 DIAGNOSIS — E785 Hyperlipidemia, unspecified: Secondary | ICD-10-CM | POA: Diagnosis not present

## 2021-02-08 DIAGNOSIS — R000 Tachycardia, unspecified: Secondary | ICD-10-CM | POA: Diagnosis not present

## 2021-02-08 DIAGNOSIS — I4891 Unspecified atrial fibrillation: Secondary | ICD-10-CM | POA: Diagnosis present

## 2021-02-08 DIAGNOSIS — R531 Weakness: Secondary | ICD-10-CM

## 2021-02-08 DIAGNOSIS — Z87891 Personal history of nicotine dependence: Secondary | ICD-10-CM

## 2021-02-08 DIAGNOSIS — I110 Hypertensive heart disease with heart failure: Secondary | ICD-10-CM | POA: Diagnosis not present

## 2021-02-08 DIAGNOSIS — D509 Iron deficiency anemia, unspecified: Secondary | ICD-10-CM | POA: Diagnosis not present

## 2021-02-08 DIAGNOSIS — R251 Tremor, unspecified: Secondary | ICD-10-CM | POA: Diagnosis not present

## 2021-02-08 DIAGNOSIS — Z20822 Contact with and (suspected) exposure to covid-19: Secondary | ICD-10-CM | POA: Diagnosis present

## 2021-02-08 DIAGNOSIS — J45909 Unspecified asthma, uncomplicated: Secondary | ICD-10-CM | POA: Diagnosis not present

## 2021-02-08 DIAGNOSIS — R29898 Other symptoms and signs involving the musculoskeletal system: Secondary | ICD-10-CM | POA: Diagnosis not present

## 2021-02-08 DIAGNOSIS — D72829 Elevated white blood cell count, unspecified: Secondary | ICD-10-CM | POA: Diagnosis present

## 2021-02-08 DIAGNOSIS — E119 Type 2 diabetes mellitus without complications: Secondary | ICD-10-CM

## 2021-02-08 DIAGNOSIS — Z7901 Long term (current) use of anticoagulants: Secondary | ICD-10-CM

## 2021-02-08 DIAGNOSIS — Z66 Do not resuscitate: Secondary | ICD-10-CM | POA: Diagnosis not present

## 2021-02-08 DIAGNOSIS — Z79899 Other long term (current) drug therapy: Secondary | ICD-10-CM | POA: Diagnosis not present

## 2021-02-08 DIAGNOSIS — I10 Essential (primary) hypertension: Secondary | ICD-10-CM | POA: Diagnosis not present

## 2021-02-08 DIAGNOSIS — R6889 Other general symptoms and signs: Secondary | ICD-10-CM | POA: Diagnosis not present

## 2021-02-08 DIAGNOSIS — M4312 Spondylolisthesis, cervical region: Secondary | ICD-10-CM | POA: Diagnosis not present

## 2021-02-08 DIAGNOSIS — Z96652 Presence of left artificial knee joint: Secondary | ICD-10-CM | POA: Diagnosis present

## 2021-02-08 DIAGNOSIS — G40909 Epilepsy, unspecified, not intractable, without status epilepticus: Secondary | ICD-10-CM | POA: Diagnosis present

## 2021-02-08 DIAGNOSIS — Z833 Family history of diabetes mellitus: Secondary | ICD-10-CM

## 2021-02-08 DIAGNOSIS — Z8249 Family history of ischemic heart disease and other diseases of the circulatory system: Secondary | ICD-10-CM

## 2021-02-08 DIAGNOSIS — I499 Cardiac arrhythmia, unspecified: Secondary | ICD-10-CM | POA: Diagnosis not present

## 2021-02-08 DIAGNOSIS — R079 Chest pain, unspecified: Secondary | ICD-10-CM | POA: Diagnosis not present

## 2021-02-08 DIAGNOSIS — I6782 Cerebral ischemia: Secondary | ICD-10-CM | POA: Diagnosis not present

## 2021-02-08 DIAGNOSIS — R092 Respiratory arrest: Secondary | ICD-10-CM | POA: Diagnosis not present

## 2021-02-08 DIAGNOSIS — R0789 Other chest pain: Secondary | ICD-10-CM | POA: Diagnosis not present

## 2021-02-08 DIAGNOSIS — E66813 Obesity, class 3: Secondary | ICD-10-CM | POA: Diagnosis present

## 2021-02-08 LAB — CBC
HCT: 44.2 % (ref 39.0–52.0)
Hemoglobin: 14.3 g/dL (ref 13.0–17.0)
MCH: 29.5 pg (ref 26.0–34.0)
MCHC: 32.4 g/dL (ref 30.0–36.0)
MCV: 91.3 fL (ref 80.0–100.0)
Platelets: 320 10*3/uL (ref 150–400)
RBC: 4.84 MIL/uL (ref 4.22–5.81)
RDW: 15.2 % (ref 11.5–15.5)
WBC: 12.2 10*3/uL — ABNORMAL HIGH (ref 4.0–10.5)
nRBC: 0 % (ref 0.0–0.2)

## 2021-02-08 LAB — BASIC METABOLIC PANEL
Anion gap: 13 (ref 5–15)
BUN: 21 mg/dL (ref 8–23)
CO2: 26 mmol/L (ref 22–32)
Calcium: 9.5 mg/dL (ref 8.9–10.3)
Chloride: 96 mmol/L — ABNORMAL LOW (ref 98–111)
Creatinine, Ser: 0.93 mg/dL (ref 0.61–1.24)
GFR, Estimated: >60 mL/min (ref >60)
Glucose, Bld: 207 mg/dL — ABNORMAL HIGH (ref 70–99)
Potassium: 4.1 mmol/L (ref 3.5–5.1)
Sodium: 135 mmol/L (ref 135–145)

## 2021-02-08 LAB — SARS CORONAVIRUS 2 (TAT 6-24 HRS): SARS Coronavirus 2: NEGATIVE

## 2021-02-08 LAB — MAGNESIUM: Magnesium: 2.1 mg/dL (ref 1.7–2.4)

## 2021-02-08 MED ORDER — PANTOPRAZOLE SODIUM 40 MG PO TBEC
40.0000 mg | DELAYED_RELEASE_TABLET | Freq: Every day | ORAL | Status: DC
  Administered 2021-02-09 – 2021-02-11 (×3): 40 mg via ORAL
  Filled 2021-02-08 (×3): qty 1

## 2021-02-08 MED ORDER — INSULIN GLARGINE 100 UNIT/ML ~~LOC~~ SOLN
12.0000 [IU] | Freq: Every day | SUBCUTANEOUS | Status: DC
  Administered 2021-02-09 – 2021-02-11 (×3): 12 [IU] via SUBCUTANEOUS
  Filled 2021-02-08 (×4): qty 0.12

## 2021-02-08 MED ORDER — SODIUM CHLORIDE 0.9 % IV BOLUS
1000.0000 mL | Freq: Once | INTRAVENOUS | Status: AC
  Administered 2021-02-08: 1000 mL via INTRAVENOUS

## 2021-02-08 MED ORDER — POTASSIUM CHLORIDE CRYS ER 20 MEQ PO TBCR
40.0000 meq | EXTENDED_RELEASE_TABLET | Freq: Every day | ORAL | Status: DC
  Administered 2021-02-09 – 2021-02-11 (×3): 40 meq via ORAL
  Filled 2021-02-08 (×3): qty 2

## 2021-02-08 MED ORDER — SILVER SULFADIAZINE 1 % EX CREA
1.0000 "application " | TOPICAL_CREAM | Freq: Every day | CUTANEOUS | Status: DC
  Administered 2021-02-09 – 2021-02-11 (×4): 1 via TOPICAL
  Filled 2021-02-08: qty 85

## 2021-02-08 MED ORDER — ACETAMINOPHEN 325 MG PO TABS
650.0000 mg | ORAL_TABLET | Freq: Four times a day (QID) | ORAL | Status: DC | PRN
  Administered 2021-02-09 – 2021-02-11 (×6): 650 mg via ORAL
  Filled 2021-02-08 (×6): qty 2

## 2021-02-08 MED ORDER — ATORVASTATIN CALCIUM 40 MG PO TABS
40.0000 mg | ORAL_TABLET | Freq: Every evening | ORAL | Status: DC
  Administered 2021-02-08 – 2021-02-10 (×3): 40 mg via ORAL
  Filled 2021-02-08 (×3): qty 1

## 2021-02-08 MED ORDER — SILVER SULFADIAZINE 1 % EX CREA
1.0000 "application " | TOPICAL_CREAM | CUTANEOUS | Status: DC
  Filled 2021-02-08: qty 85

## 2021-02-08 MED ORDER — SODIUM CHLORIDE 0.9 % IV SOLN
2.0000 g | INTRAVENOUS | Status: DC
  Administered 2021-02-08 – 2021-02-10 (×3): 2 g via INTRAVENOUS
  Filled 2021-02-08 (×4): qty 20

## 2021-02-08 MED ORDER — HYDROCODONE-ACETAMINOPHEN 5-325 MG PO TABS
1.0000 | ORAL_TABLET | Freq: Once | ORAL | Status: AC
  Administered 2021-02-08: 1 via ORAL
  Filled 2021-02-08: qty 1

## 2021-02-08 MED ORDER — METOPROLOL SUCCINATE ER 25 MG PO TB24
25.0000 mg | ORAL_TABLET | Freq: Every day | ORAL | Status: DC
  Administered 2021-02-09: 25 mg via ORAL
  Filled 2021-02-08: qty 1

## 2021-02-08 MED ORDER — APIXABAN 5 MG PO TABS
5.0000 mg | ORAL_TABLET | Freq: Two times a day (BID) | ORAL | Status: DC
  Administered 2021-02-08 – 2021-02-11 (×6): 5 mg via ORAL
  Filled 2021-02-08 (×6): qty 1

## 2021-02-08 MED ORDER — ONDANSETRON HCL 4 MG/2ML IJ SOLN: 4.0000 mg | Freq: Four times a day (QID) | INTRAMUSCULAR | Status: DC | PRN

## 2021-02-08 MED ORDER — TORSEMIDE 20 MG PO TABS: 20.0000 mg | ORAL_TABLET | Freq: Two times a day (BID) | ORAL | Status: DC

## 2021-02-08 MED ORDER — ACETAMINOPHEN 650 MG RE SUPP: 650.0000 mg | Freq: Four times a day (QID) | RECTAL | Status: DC | PRN

## 2021-02-08 MED ORDER — LORAZEPAM 2 MG/ML IJ SOLN
1.0000 mg | Freq: Once | INTRAMUSCULAR | Status: AC
  Administered 2021-02-08: 1 mg via INTRAVENOUS
  Filled 2021-02-08: qty 1

## 2021-02-08 MED ORDER — TORSEMIDE 20 MG PO TABS
20.0000 mg | ORAL_TABLET | Freq: Every day | ORAL | Status: DC
  Administered 2021-02-08 – 2021-02-10 (×3): 20 mg via ORAL
  Filled 2021-02-08 (×3): qty 1

## 2021-02-08 MED ORDER — DILTIAZEM HCL-DEXTROSE 125-5 MG/125ML-% IV SOLN (PREMIX)
5.0000 mg/h | INTRAVENOUS | Status: DC
  Administered 2021-02-08: 5 mg/h via INTRAVENOUS
  Administered 2021-02-09 (×2): 15 mg/h via INTRAVENOUS
  Filled 2021-02-08 (×3): qty 125

## 2021-02-08 MED ORDER — POTASSIUM CHLORIDE CRYS ER 20 MEQ PO TBCR: 40.0000 meq | EXTENDED_RELEASE_TABLET | Freq: Every day | ORAL | Status: DC

## 2021-02-08 MED ORDER — INSULIN GLARGINE 100 UNIT/ML SOLOSTAR PEN: 12.0000 [IU] | PEN_INJECTOR | SUBCUTANEOUS | Status: DC

## 2021-02-08 MED ORDER — METOPROLOL TARTRATE 5 MG/5ML IV SOLN: 5.0000 mg | INTRAVENOUS | Status: DC | PRN

## 2021-02-08 MED ORDER — LORAZEPAM 2 MG/ML IJ SOLN
0.2500 mg | Freq: Once | INTRAMUSCULAR | Status: AC | PRN
  Administered 2021-02-09: 0.5 mg via INTRAVENOUS
  Filled 2021-02-08: qty 1

## 2021-02-08 MED ORDER — TORSEMIDE 20 MG PO TABS
40.0000 mg | ORAL_TABLET | Freq: Every day | ORAL | Status: DC
  Administered 2021-02-09 – 2021-02-11 (×3): 40 mg via ORAL
  Filled 2021-02-08 (×3): qty 2

## 2021-02-08 MED ORDER — PREGABALIN 100 MG PO CAPS
100.0000 mg | ORAL_CAPSULE | Freq: Three times a day (TID) | ORAL | Status: DC
  Administered 2021-02-08 – 2021-02-11 (×9): 100 mg via ORAL
  Filled 2021-02-08 (×9): qty 1

## 2021-02-08 MED ORDER — TRAZODONE HCL 100 MG PO TABS
200.0000 mg | ORAL_TABLET | Freq: Every day | ORAL | Status: DC
  Administered 2021-02-08 – 2021-02-10 (×3): 200 mg via ORAL
  Filled 2021-02-08 (×3): qty 2

## 2021-02-08 MED ORDER — ALLOPURINOL 100 MG PO TABS
100.0000 mg | ORAL_TABLET | Freq: Two times a day (BID) | ORAL | Status: DC
  Administered 2021-02-08 – 2021-02-11 (×6): 100 mg via ORAL
  Filled 2021-02-08 (×6): qty 1

## 2021-02-08 MED ORDER — ONDANSETRON HCL 4 MG PO TABS: 4.0000 mg | ORAL_TABLET | Freq: Four times a day (QID) | ORAL | Status: DC | PRN

## 2021-02-08 MED ORDER — FERROUS SULFATE 324 (65 FE) MG PO TBEC: 324.0000 mg | DELAYED_RELEASE_TABLET | Freq: Every day | ORAL | Status: DC

## 2021-02-08 MED ORDER — VENLAFAXINE HCL ER 75 MG PO CP24
225.0000 mg | ORAL_CAPSULE | Freq: Every day | ORAL | Status: DC
  Administered 2021-02-09 – 2021-02-11 (×3): 225 mg via ORAL
  Filled 2021-02-08 (×4): qty 1

## 2021-02-08 MED ORDER — FERROUS SULFATE 325 (65 FE) MG PO TABS
325.0000 mg | ORAL_TABLET | Freq: Every day | ORAL | Status: DC
  Administered 2021-02-09 – 2021-02-11 (×3): 325 mg via ORAL
  Filled 2021-02-08 (×3): qty 1

## 2021-02-08 MED ORDER — TEMAZEPAM 15 MG PO CAPS
15.0000 mg | ORAL_CAPSULE | Freq: Every day | ORAL | Status: DC
  Administered 2021-02-08 – 2021-02-10 (×3): 15 mg via ORAL
  Filled 2021-02-08 (×3): qty 1

## 2021-02-08 MED ORDER — LORATADINE 10 MG PO TABS
10.0000 mg | ORAL_TABLET | Freq: Every day | ORAL | Status: DC
  Administered 2021-02-08 – 2021-02-11 (×4): 10 mg via ORAL
  Filled 2021-02-08 (×4): qty 1

## 2021-02-08 MED ORDER — OXYCODONE HCL 5 MG PO TABS
5.0000 mg | ORAL_TABLET | Freq: Three times a day (TID) | ORAL | Status: DC | PRN
  Administered 2021-02-08 – 2021-02-10 (×4): 5 mg via ORAL
  Filled 2021-02-08 (×4): qty 1

## 2021-02-08 MED ORDER — BUSPIRONE HCL 5 MG PO TABS
5.0000 mg | ORAL_TABLET | Freq: Three times a day (TID) | ORAL | Status: DC
  Administered 2021-02-08 – 2021-02-11 (×9): 5 mg via ORAL
  Filled 2021-02-08 (×9): qty 1

## 2021-02-08 MED ORDER — FLUTICASONE PROPIONATE 50 MCG/ACT NA SUSP
2.0000 | Freq: Every day | NASAL | Status: DC
  Administered 2021-02-09 – 2021-02-11 (×3): 2 via NASAL
  Filled 2021-02-08: qty 16

## 2021-02-08 MED ORDER — ARIPIPRAZOLE 5 MG PO TABS
5.0000 mg | ORAL_TABLET | Freq: Every day | ORAL | Status: DC
  Administered 2021-02-08 – 2021-02-10 (×3): 5 mg via ORAL
  Filled 2021-02-08 (×3): qty 1

## 2021-02-08 NOTE — ED Provider Notes (Signed)
[facility] EMERGENCY DEPARTMENT Provider Note   CSN: [id number] Arrival date & time: 02/08/21  1117     History Chief Complaint  Patient presents with   Atrial Fibrillation    Lawrence [name] is a 65 y.o. male.  HPI     65 year old male comes in a chief complaint of weakness.  Patient has history of CHF, moderate aortic stenosis, A. fib on Eliquis and recent admission to the hospital for left-sided weakness.  Patient reports that he started feeling unwell yesterday.  His symptoms is described as just feeling tired and fatigued.  He is also had some dizziness.  Normally he snaps out of A. Fib pretty quickly, but this episode lasted into today, prompting his girlfriend to send him to the ER.  Patient's girlfriend is at the bedside and also reports that patient get again started having left lower extremity weakness yesterday.  He was last normal yesterday afternoon from weakness perspective.  Patient denies any new numbness, tingling, vision change.  Past Medical History:  Diagnosis Date   Acquired dilation of ascending aorta and aortic root ([place])    28mm by echo 01/2021   Acute on chronic diastolic CHF (congestive heart failure) ([place]) 01/13/2018   Acute respiratory failure with hypoxia ([place]) 01/13/2018   Adenomatous colon polyp 2007   Anasarca 01/31/2017   Anemia    Anxiety    Aortic stenosis    moderate AS by echo 01/2021   Arthritis    Asthma    Bilateral lower extremity edema 01/31/2017   Bilateral lower leg cellulitis    BPH without obstruction/lower urinary tract symptoms 02/22/2017   Cellulitis 11/07/2017   Cellulitis of lower extremity    Chest pain    Chronic cough    Chronic diastolic CHF (congestive heart failure) (HCC)    Chronic venous stasis 03/07/2019   COPD (chronic obstructive pulmonary disease) (HCC)    Coronary artery calcification seen on CAT scan    Depression    Diabetic neuropathy ([place]) 09/11/2019   Hematuria 02/22/2017    History of anemia 12/26/2018   History of cardiac cath    a.  01/2018 with angiographically minimal coronary disease with  up to 40% prox LAD and 20% mid LCx and OM2 with normal LVF and normal LVEDP.[name]   History of colon polyps 08/24/2018   Hypertension    Leukocytosis 08/18/2018   Morbid obesity ([place])    New onset type 2 diabetes mellitus ([place]) 11/08/2017   OSA (obstructive sleep apnea)    Pain due to onychomycosis of toenails of both feet 09/11/2019   Peripheral neuropathy 02/22/2017   Physical deconditioning 02/22/2017   Primary osteoarthritis, left shoulder 03/05/2017   PTSD (post-traumatic stress disorder)    Pure hypercholesterolemia    QT prolongation 03/07/2019   Right foot pain 01/13/2018   Shortness of breath    Sinus tachycardia 03/07/2019   Sleep apnea    CPAP   Type 2 diabetes mellitus with vascular disease ([place]) 09/11/2019    Patient Active Problem List   Diagnosis Date Noted   Acute left-sided weakness 01/25/2021   AF (paroxysmal atrial fibrillation) ([place]) 01/25/2021   COPD (chronic obstructive pulmonary disease) ([place]) 01/25/2021   Moderate aortic stenosis 01/21/2021   Acquired dilation of ascending aorta and aortic root ([place]) 01/21/2021   Coronary artery disease due to lipid rich plaque    SVT (supraventricular tachycardia) (HCC)    Partial seizure ([place])    Acute CHF (congestive heart failure) ([place]) 10/17/2020  Essential hypertension 10/17/2020   Hypokalemia 10/17/2020   Cellulitis of right leg 12/04/2019   Pain due to onychomycosis of toenails of both feet 09/11/2019   Diabetic neuropathy ([place]) 09/11/2019   Type 2 diabetes mellitus with diabetic polyneuropathy, with long-term current use of insulin ([place]) 09/11/2019   Sinus tachycardia 03/07/2019   Chronic venous stasis 03/07/2019   History of anemia 12/26/2018   Anemia 08/24/2018   History of colon polyps 08/24/2018   Do not resuscitate status 08/24/2018   Chronic diastolic CHF (congestive heart failure)  ([place]) 08/18/2018   Bilateral lower leg cellulitis    Coronary artery calcification seen on CAT scan    Pure hypercholesterolemia    Chest pain    Shortness of breath    Acute respiratory failure with hypoxia ([place]) 01/13/2018   Right foot pain 01/13/2018   Acute on chronic diastolic CHF (congestive heart failure) ([place]) 01/13/2018   Diabetes mellitus type 2 in obese ([place]) 01/13/2018   New onset type 2 diabetes mellitus ([place]) 11/08/2017   Cellulitis 11/07/2017   Primary osteoarthritis, left shoulder 03/05/2017   Essential hypertension, benign 02/22/2017   BPH without obstruction/lower urinary tract symptoms 02/22/2017   Hematuria 02/22/2017   Peripheral neuropathy 02/22/2017   Physical deconditioning 02/22/2017   Morbid obesity ([place])    PTSD (post-traumatic stress disorder)    Anasarca 01/31/2017   Bilateral lower extremity edema 01/31/2017   OSA (obstructive sleep apnea)     Past Surgical History:  Procedure Laterality Date   ENDOVENOUS ABLATION SAPHENOUS VEIN W/ LASER Right 08/20/2020   endovenous laser ablation right greater saphenous vein by Gae Gallop MD    JOINT REPLACEMENT     left knee replacement x 2   KNEE ARTHROSCOPY Bilateral    LEFT HEART CATH AND CORONARY ANGIOGRAPHY N/A 01/17/2018   Procedure: LEFT HEART CATH AND CORONARY ANGIOGRAPHY;  Surgeon: Leonie Man, MD;  Location: [place] [facility];  Service: Cardiovascular;  Laterality: N/A;   UMBILICAL HERNIA REPAIR         Family History  Problem Relation Age of Onset   CAD Maternal Grandfather    Diabetes Other    Diabetes Mellitus II Neg Hx    Colon cancer Neg Hx    Esophageal cancer Neg Hx    Inflammatory bowel disease Neg Hx    Liver disease Neg Hx    Pancreatic cancer Neg Hx    Rectal cancer Neg Hx    Stomach cancer Neg Hx     Social History   Tobacco Use   Smoking status: Former    Pack years: 0.00    Types: Cigarettes    Quit date: 03/07/2016    Years since quitting: 4.9   Smokeless  tobacco: Never  Substance Use Topics   Alcohol use: Not Currently    Comment: rare   Drug use: No    Home Medications Prior to Admission medications   Medication Sig Start Date End Date Taking? Authorizing Provider  Accu-Chek FastClix Lancets MISC 1 each by Other route in the morning and at bedtime. 12/22/20   [provider]  acetaminophen (TYLENOL) 325 MG tablet Take 2 tablets (650 mg total) by mouth every 4 (four) hours as needed for mild pain (or temp > 37.5 C (99.5 F)). 01/26/21   Cherene Altes, MD  albuterol (VENTOLIN HFA) 108 (90 Base) MCG/ACT inhaler Inhale 2 puffs into the lungs every 6 (six) hours as needed for wheezing or shortness of breath. 03/10/20   [provider]  Alcohol Swabs (ALCOHOL PREP) 70 % PADS 1 each by Other route in the morning, at noon, and at bedtime. 12/22/20   [provider]  allopurinol (ZYLOPRIM) 100 MG tablet Take 100 mg by mouth 2 (two) times daily.    [provider]  apixaban (ELIQUIS) 5 MG TABS tablet Take 1 tablet (5 mg total) by mouth 2 (two) times daily. 01/07/21   Sueanne Margarita, MD  ARIPiprazole (ABILIFY) 5 MG tablet Take 1 tablet (5 mg total) by mouth at bedtime. 01/08/21   Mozingo, Berdie Ogren, NP  atorvastatin (LIPITOR) 40 MG tablet TAKE ONE TABLET BY MOUTH DAILY AT 6PM 01/07/21   Sueanne Margarita, MD  B-D ULTRAFINE III SHORT PEN 31G X 8 MM MISC 1 each by Other route See admin instructions. Use to inject insulin subcutaneously at bedtime as directed. 11/20/20   [provider]  blood glucose meter kit and supplies Dispense based on patient and insurance preference. Use up to four times daily as directed. (FOR ICD-10 E10.9, E11.9). 11/09/17   Eugenie Filler, MD  busPIRone (BUSPAR) 5 MG tablet Take 1 tablet (5 mg total) by mouth 3 (three) times daily. 12/10/20   Mozingo, Berdie Ogren, NP  cetirizine (ZYRTEC) 10 MG tablet Take 10 mg by mouth at bedtime.     [provider]  dapagliflozin  propanediol (FARXIGA) 10 MG TABS tablet Take 1 tablet (10 mg total) by mouth daily before breakfast. 01/05/21   Renato Shin, MD  Dulaglutide (TRULICITY) 4.5 WU/9.8JX SOPN Inject 4.5 mg as directed once a week. 11/19/20   Renato Shin, MD  ferrous sulfate 324 (65 Fe) MG TBEC Take 324 mg by mouth daily with breakfast.     [provider]  fluticasone (FLONASE) 50 MCG/ACT nasal spray Place 2 sprays into both nostrils daily.     [provider]  isosorbide mononitrate (IMDUR) 30 MG 24 hr tablet Take 1 tablet (30 mg total) by mouth daily. 01/07/21   Sueanne Margarita, MD  LANTUS SOLOSTAR 100 UNIT/ML Solostar Pen Inject 12 Units into the skin every morning. 11/19/20   Renato Shin, MD  metFORMIN (GLUCOPHAGE) 500 MG tablet Take 1,000 mg by mouth 2 (two) times daily with a meal.    [provider]  metoprolol succinate (TOPROL-XL) 25 MG 24 hr tablet Take 1 tablet (25 mg total) by mouth daily. 01/07/21   Sueanne Margarita, MD  omeprazole (PRILOSEC) 20 MG capsule Take 20 mg by mouth every morning. 05/29/20   [provider]  oxyCODONE (OXY IR/ROXICODONE) 5 MG immediate release tablet Take 5 mg by mouth every 8 (eight) hours as needed for severe pain. 09/17/20   [provider]  potassium chloride SA (KLOR-CON) 20 MEQ tablet Take 2 tablets (40 mEq total) by mouth daily. 01/07/21   Sueanne Margarita, MD  prazosin (MINIPRESS) 2 MG capsule Take 2 capsules (4 mg total) by mouth at bedtime. 01/07/21   Sueanne Margarita, MD  pregabalin (LYRICA) 100 MG capsule Take 100 mg by mouth in the morning, at noon, and at bedtime.  07/14/19   [provider]  silver sulfADIAZINE (SILVADENE) 1 % cream Apply 1 application topically See admin instructions. Apply as directed once a day as directed to bilateral legs    [provider]  spironolactone (ALDACTONE) 25 MG tablet Take 1 tablet (25 mg total) by mouth daily. 01/07/21   Sueanne Margarita, MD  temazepam (RESTORIL) 15 MG capsule Take 1  capsule (15 mg total) by mouth at bedtime. 12/22/20   Mozingo, Berdie Ogren, NP  torsemide (DEMADEX) 20 MG tablet Take 2 tablets (40 mg total) in the morning and 1 tablet (20 mg total) in the evening. 01/07/21   Sueanne Margarita, MD  traZODone (DESYREL) 100 MG tablet Take 2 tablets (200 mg total) by mouth at bedtime. 12/31/20   Mozingo, Berdie Ogren, NP  venlafaxine XR (EFFEXOR-XR) 75 MG 24 hr capsule Take 3 capsules (225 mg total) by mouth daily with breakfast. 12/10/20   Mozingo, Berdie Ogren, NP  Vitamin D, Ergocalciferol, (DRISDOL) 1.25 MG (50000 UNIT) CAPS capsule Take 50,000 Units by mouth every 7 (seven) days.    [provider]    Allergies    Vancomycin, Niacin, Niacin and related, Tubersol [tuberculin], and Doxycycline  Review of Systems   Review of Systems  Constitutional:  Positive for activity change and fatigue.  Respiratory:  Negative for shortness of breath.   Cardiovascular:  Negative for chest pain.  Neurological:  Positive for weakness.  Hematological:  Bruises/bleeds easily.  All other systems reviewed and are negative.  Physical Exam Updated Vital Signs BP (!) 114/91   Pulse (!) 154   Temp 97.9 F (36.6 C) (Oral)   Resp 20   SpO2 95%   Physical Exam Vitals and nursing note reviewed.  Constitutional:      Appearance: He is well-developed.  HENT:     Head: Atraumatic.  Eyes:     Extraocular Movements: Extraocular movements intact.     Pupils: Pupils are equal, round, and reactive to light.  Cardiovascular:     Rate and Rhythm: Tachycardia present. Rhythm irregular.  Pulmonary:     Effort: Pulmonary effort is normal.  Musculoskeletal:     Cervical back: Neck supple.  Skin:    General: Skin is warm.  Neurological:     Mental Status: He is alert and oriented to person, place, and time.     Cranial Nerves: No cranial nerve deficit.     Sensory: No sensory deficit.     Motor: Weakness present.     Comments: Left lower extremity weakness  otherwise patient does not have any acute numbness, tingling over the upper and lower extremity bilaterally.    ED Results / Procedures / Treatments   Labs (all labs ordered are listed, but only abnormal results are displayed) Labs Reviewed  BASIC METABOLIC PANEL - Abnormal; Notable for the following components:      Result Value   Chloride 96 (*)    Glucose, Bld 207 (*)    All other components within normal limits  CBC - Abnormal; Notable for the following components:   WBC 12.2 (*)    All other components within normal limits  SARS CORONAVIRUS 2 (TAT 6-24 HRS)  MAGNESIUM    EKG EKG Interpretation  Date/Time:  Monday February 08 2021 11:26:32 EDT Ventricular Rate:  159 PR Interval:    QRS Duration: 116 QT Interval:  307 QTC Calculation: 500 R Axis:   0 Text Interpretation: Atrial fibrillation with rapid V-rate Nonspecific intraventricular conduction delay Low voltage, precordial leads af with rvr Confirmed by Varney Biles [id number]) on 02/08/2021 12:46:16 PM  Radiology CT Head Wo Contrast  Result Date: 02/08/2021 CLINICAL DATA:  PT BIB EMS from home. Pt has a hx of afib and usually can convert himself out of it. Pts HR is 120-160. Pt is axox4. Pt was put on 2L by EMS. EXAM: CT HEAD WITHOUT CONTRAST TECHNIQUE: Contiguous  axial images were obtained from the base of the skull through the vertex without intravenous contrast. COMPARISON:  01/25/2021 FINDINGS: Brain: No evidence of acute infarction, hemorrhage, hydrocephalus, extra-axial collection or mass lesion/mass effect. Mild periventricular white matter hypoattenuation noted consistent with chronic microvascular ischemic change. Vascular: No hyperdense vessel or unexpected calcification. Skull: Normal. Negative for fracture or focal lesion. Sinuses/Orbits: Small amount of dependent fluid in the left sphenoid sinus. Mild scattered areas of ethmoid sinus mucosal thickening. Mild polypoid mucosal thickening at the floor of the right  maxillary sinus. Other: None. IMPRESSION: 1. No acute intracranial abnormalities. 2. Mild chronic microvascular ischemic change. Electronically Signed   By: Lajean Manes M.D.   On: 02/08/2021 13:05    Procedures .Critical Care  Date/Time: 02/08/2021 4:10 PM Performed by: Varney Biles, MD Authorized by: Varney Biles, MD   Critical care provider statement:    Critical care time (minutes):  72   Critical care was time spent personally by me on the following activities:  Discussions with consultants, evaluation of patient's response to treatment, examination of patient, ordering and performing treatments and interventions, ordering and review of laboratory studies, ordering and review of radiographic studies, pulse oximetry, re-evaluation of patient's condition, obtaining history from patient or surrogate and review of old charts   Medications Ordered in ED Medications  metoprolol tartrate (LOPRESSOR) injection 5 mg (has no administration in time range)  diltiazem (CARDIZEM) 125 mg in dextrose 5% 125 mL (1 mg/mL) infusion (has no administration in time range)  sodium chloride 0.9 % bolus 1,000 mL (0 mLs Intravenous Stopped 02/08/21 1404)  HYDROcodone-acetaminophen (NORCO/VICODIN) 5-325 MG per tablet 1 tablet (1 tablet Oral Given 02/08/21 1451)    ED Course  I have reviewed the triage vital signs and the nursing notes.  Pertinent labs & imaging results that were available during my care of the patient were reviewed by me and considered in my medical decision making (see chart for details).    MDM Rules/Calculators/A&P                          65 year old male comes in with chief complaint of left-sided weakness and fatigue.  He has history of paroxysmal A. fib on Eliquis.  It appears that he has likely been in A. fib since yesterday and his symptoms are secondary to it.  Of note, he is also noted to have left lower extremity weakness, he had similar symptoms just last month when he was  admitted to the hospital for strokelike symptoms.  At that time he had MRI brain, CT angiogram which were all negative and he was medically optimized for stroke prevention.  Basic labs have been sent.  History is not suggestive of any underlying infection.  4:10 PM Patient continues to have the left-sided weakness.  MRI brain is still pending. I had ordered 5 mg IV metoprolol, however the nursing staff has not given that because patient's blood pressure has been often less than 100.  During my assessment now his blood pressure is 114/90.  I had a lengthy discussion with the patient on treatment course and the decision is for patient to be admitted to the hospital and start him on diltiazem drip.  He is hesitant about getting cardioversion. He will still need MRI, which if positive neurology team can be consulted.   Admit for A. fib optimization and rule out stroke.  Final Clinical Impression(s) / ED Diagnoses Final diagnoses:  Atrial fibrillation with RVR (  [facility])    Rx / DC Orders ED Discharge Orders          Ordered    Amb referral to AFIB Clinic        02/08/21 [place], [place], MD 02/08/21 1611

## 2021-02-08 NOTE — Consult Note (Signed)
NEUROLOGY CONSULTATION NOTE   Date of service: February 08, 2021 Patient Name: Lawrence [name] MRN:  [id number] DOB:  [birth year] Reason for consult: "recurrent left sided weakness" Requesting Provider: Norval Morton, MD _ _ _   _ __   _ __ _ _  __ __   _ __   __ _  History of Present Illness  Lawrence [name] is a 65 y.o. male with PMH significant for DM II with peripheral neuropathy, AF on Eliquis, CdHF, anxietiy, AS, BPH, COPD, HLD, depression, and PTSD, hx of prior RUE shaking concerning for partial seizures who is admitted to the hospital with lethargy and LLE weakness.  Report that this is his 4th episode of LLE weakness over he last 2 weeks. He has never had these before that. He feels that theses are triggered by sitting or lying down for a prolonged period of time. He feels that the episodes typically last a few hours and then get better. He has difficulty lifting his left leg off the bed but can move his foot fine. He feels tingling in the arch of left foot but also tingling in his R foot that goes into his toes. This goes away after a couple hours.  As for his lethargy, he feels that this happens when he is having Afibb.  He also reports that back in march, he was told he had seizures. He was evaluated by my colleague Dr. Livia Snellen on 10/20/20 and per her notes, 2 witnessed events in 4 days concerning for partial seizure with impaired awareness. Wife was able to capture the second event on a video which was viewed by Dr. Livia Snellen. Per the description of the event on her notes. "He began to have rhythmic jerking of both extremities (the beginning of the spell was not captured on video and I am not clear if it started on one side or not).  He again had decreased responsiveness.  He did not have any incontinence associated with this event.  He was lethargic and confused afterwards."  He was loaded with Keppra 3G Iv once and his pregabalin was increased to 100mg  TID and continued on home Temazepam 15mg   QHS. He has not had any more seizure like events since. He reports that he ran out of his pregabalin 2 days ago but just got the refill in his mail.  ROS   Constitutional Denies weight loss, fever and chills.   HEENT Denies changes in vision and hearing.   Respiratory Denies SOB and cough.   CV Denies palpitations and CP   GI Denies abdominal pain, nausea, vomiting and diarrhea.   GU Denies dysuria and urinary frequency.   MSK Denies myalgia and joint pain.   Skin Denies rash and pruritus.   Neurological Denies headache and syncope.   Psychiatric Denies recent changes in mood. Denies anxiety and depression.    Past History   Past Medical History:  Diagnosis Date   Acquired dilation of ascending aorta and aortic root ([place])    51mm by echo 01/2021   Acute on chronic diastolic CHF (congestive heart failure) ([place]) 01/13/2018   Acute respiratory failure with hypoxia ([place]) 01/13/2018   Adenomatous colon polyp 2007   Anasarca 01/31/2017   Anemia    Anxiety    Aortic stenosis    moderate AS by echo 01/2021   Arthritis    Asthma    Bilateral lower extremity edema 01/31/2017   Bilateral lower leg cellulitis    BPH without obstruction/lower urinary tract  symptoms 02/22/2017   Cellulitis 11/07/2017   Cellulitis of lower extremity    Chest pain    Chronic cough    Chronic diastolic CHF (congestive heart failure) (H[facility])    Chronic venous stasis 03/07/2019   COPD (chronic obstructive pulmonary disease) (H[facility])    Coronary artery calcification seen on CAT scan    Depression    Diabetic neuropathy ([place]) 09/11/2019   Hematuria 02/22/2017   History of anemia 12/26/2018   History of cardiac cath    a.  01/2018 with angiographically minimal coronary disease with  up to 40% prox LAD and 20% mid LCx and OM2 with normal LVF and normal LVEDP.[name]   History of colon polyps 08/24/2018   Hypertension    Leukocytosis 08/18/2018   Morbid obesity ([place])    New onset type 2 diabetes mellitus ([place])  11/08/2017   OSA (obstructive sleep apnea)    Pain due to onychomycosis of toenails of both feet 09/11/2019   Peripheral neuropathy 02/22/2017   Physical deconditioning 02/22/2017   Primary osteoarthritis, left shoulder 03/05/2017   PTSD (post-traumatic stress disorder)    Pure hypercholesterolemia    QT prolongation 03/07/2019   Right foot pain 01/13/2018   Shortness of breath    Sinus tachycardia 03/07/2019   Sleep apnea    CPAP   Type 2 diabetes mellitus with vascular disease ([place]) 09/11/2019   Past Surgical History:  Procedure Laterality Date   ENDOVENOUS ABLATION SAPHENOUS VEIN W/ LASER Right 08/20/2020   endovenous laser ablation right greater saphenous vein by Gae Gallop MD    JOINT REPLACEMENT     left knee replacement x 2   KNEE ARTHROSCOPY Bilateral    LEFT HEART CATH AND CORONARY ANGIOGRAPHY N/A 01/17/2018   Procedure: LEFT HEART CATH AND CORONARY ANGIOGRAPHY;  Surgeon: Leonie Man, MD;  Location: [facility];  Service: Cardiovascular;  Laterality: N/A;   UMBILICAL HERNIA REPAIR     Family History  Problem Relation Age of Onset   CAD Maternal Grandfather    Diabetes Other    Diabetes Mellitus II Neg Hx    Colon cancer Neg Hx    Esophageal cancer Neg Hx    Inflammatory bowel disease Neg Hx    Liver disease Neg Hx    Pancreatic cancer Neg Hx    Rectal cancer Neg Hx    Stomach cancer Neg Hx    Social History   Socioeconomic History   Marital status: Single    Spouse name: Not on file   Number of children: 1   Years of education: Not on file   Highest education level: Not on file  Occupational History   Occupation: retired  Tobacco Use   Smoking status: Former    Pack years: 0.00    Types: Cigarettes    Quit date: 03/07/2016    Years since quitting: 4.9   Smokeless tobacco: Never  Substance and Sexual Activity   Alcohol use: Not Currently    Comment: rare   Drug use: No   Sexual activity: Not on file  Other Topics Concern   Not on  file  Social History Narrative   Not on file   Social Determinants of Health   Financial Resource Strain: Not on file  Food Insecurity: Not on file  Transportation Needs: Not on file  Physical Activity: Not on file  Stress: Not on file  Social Connections: Not on file   Allergies  Allergen Reactions   Vancomycin Other (See Comments)    "  Red Man Syndrome" 02/02/17: possible cause for rash under both arms   Niacin Other (See Comments)   Niacin And Related Other (See Comments)    Red man syndrome   Tubersol [Tuberculin] Other (See Comments)    Reaction unknown   Doxycycline Rash and Other (See Comments)    Medications  (Not in a hospital admission)    Vitals   Vitals:   02/08/21 1600 02/08/21 1615 02/08/21 1630 02/08/21 1645  BP: 108/71 94/68 97/76  (!) 107/50  Pulse: 84 (!) 44 (!) 101 67  Resp: 19 15 13 18   Temp:      TempSrc:      SpO2: 93% 96% 96% 97%     There is no height or weight on file to calculate BMI.  Physical Exam   General: Laying comfortably in bed; in no acute distress.  HENT: Normal oropharynx and mucosa. Normal external appearance of ears and nose.  Neck: Supple, no pain or tenderness  CV: No JVD. No peripheral edema.  Pulmonary: Symmetric Chest rise. Normal respiratory effort.  Abdomen: Soft to touch, non-tender.  Ext: No cyanosis, edema, or deformity  Skin: No rash. Normal palpation of skin.   Musculoskeletal: Normal digits and nails by inspection. No clubbing.   Neurologic Examination  Mental status/Cognition: Alert, oriented to self, place, month and year, good attention.  Speech/language: Fluent, comprehension intact, object naming intact, repetition intact.  Cranial nerves:   CN II Pupils equal and reactive to light, no VF deficits    CN III,IV,VI EOM intact, no gaze preference or deviation, no nystagmus   CN V normal sensation in V1, V2, and V3 segments bilaterally   CN VII Mild flattening of nasolabial fold on the left.   CN VIII  normal hearing to speech   CN IX & X normal palatal elevation, no uvular deviation   CN XI 5/5 head turn and 5/5 shoulder shrug bilaterally   CN XII midline tongue protrusion   Motor:  Muscle bulk: normal, tone normal, pronator drift mild LUE drift with no pronation tremor none Mvmt Root Nerve  Muscle Right Left Comments  SA C5/6 Ax Deltoid 5 5   EF C5/6 Mc Biceps 5 5   EE C6/7/8 Rad Triceps 5 5   WF C6/7 Med FCR     WE C7/8 PIN ECU     F Ab C8/T1 U ADM/FDI 5 4   HF L1/2/3 Fem Illopsoas 5 4   KE L2/3/4 Fem Quad 5 5   DF L4/5 D Peron Tib Ant 5 5   PF S1/2 Tibial Grc/Sol 5 5    Reflexes:  Right Left Comments  Pectoralis      Biceps (C5/6) 2 2   Brachioradialis (C5/6) 2 2    Triceps (C6/7) 2 2    Patellar (L3/4) 2 2    Achilles (S1)      Hoffman      Plantar     Jaw jerk    Sensation:  Light touch intact   Pin prick Decreased in L hand and forearm. Decreased in L thigh, more so laterally(?lateral femoral cutaneous distribution)   Temperature    Vibration   Proprioception    Coordination/Complex Motor:  - Finger to Nose intact BL - Heel to shin unable to do due to body habitus an weakness. - Rapid alternating movement are slowed in LUE. - Gait: unsafe to assess.  Labs   CBC:  Recent Labs  Lab 02/08/21 1536  WBC 12.2*  HGB 14.3  HCT 44.2  MCV 91.3  PLT 962    Basic Metabolic Panel:  Lab Results  Component Value Date   NA 135 02/08/2021   K 4.1 02/08/2021   CO2 26 02/08/2021   GLUCOSE 207 (H) 02/08/2021   BUN 21 02/08/2021   CREATININE 0.93 02/08/2021   CALCIUM 9.5 02/08/2021   GFRNONAA >60 02/08/2021   GFRAA 77 05/20/2020   Lipid Panel:  Lab Results  Component Value Date   LDLCALC 50 01/26/2021   HgbA1c:  Lab Results  Component Value Date   HGBA1C 8.0 (H) 01/26/2021   Urine Drug Screen:     Component Value Date/Time   LABOPIA NONE DETECTED 01/25/2021 2000   COCAINSCRNUR NONE DETECTED 01/25/2021 2000   LABBENZ POSITIVE (A) 01/25/2021 2000    AMPHETMU NONE DETECTED 01/25/2021 2000   THCU NONE DETECTED 01/25/2021 2000   LABBARB NONE DETECTED 01/25/2021 2000    Alcohol Level     Component Value Date/Time   ETH <10 01/25/2021 1819    CT Head without contrast: CTH was negative for a large hypodensity concerning for a large territory infarct or hyperdensity concerning for an ICH  MR Angio head without contrast and Carotid Duplex BL: pending  MRI Brain: No evidence of recent infarction, hemorrhage, or mass. No significant change since recent prior study.  Impression   Fleet Higham is a 65 y.o. male with PMH significant for DM II with peripheral neuropathy, AF on Eliquis, CdHF, anxietiy, AS, BPH, COPD, HLD, depression, and PTSD, hx of prior RUE shaking concerning for partial seizures who is admitted to the hospital with lethargy and LLE weakness. His neurologic examination is notable for mild L Facial droop, weak L hand grip with mild LUE drift without pronation and LLE hip flexion weakness. He was not aware of the L hand grip strength weakness and the facial droop which only became apparent to him on my exam. He also seems to have mildly decreased sensation to pinprick in his LUE and LLE.   Unlikely for facial droop to originate from a spinal cord lesion. A brainstem stroke usually presents with crossed symptoms with ipsilateral facial symptoms and contralateral extremity symptoms but not always thou. A stuttering lacunar stroke can explain his presentation, However, MRI Brain that was completed a few hours ago is negative. Alternatively, his symptoms might be from 2 separate or more lesions, which is why I think an MRI Cervical spine is reasonable.  Some of the features of his LLE weakness are suggestive of a potential femoral neuropathy. Morbid obesity is a risk factor for this. He also has decreased sensation to touch in the left leg, more so in his left lateral thigh(?lateral femoral cutaneous distribution). He feels that this  is triggered by prolonged sitting. This is not associated with significant pain and I do not think this appears to be consistent with meralgia paresthetica. Pedal pulses are palpable and does not endorse claudication in his legs.  He has no idea how long the left hand grip has been weak. He was unaware of this and was brought to his attention today on exam. This was not noted on prior neurologic evaluation. He was also unaware of the mild left facial asymmetry. He just had an MRI Brain a few hours ago which did not show an acute stroke or another lesion that would explain this. This is very subtle on exam thou.  As for the seizures, he did have 2 episodes of BL upper extremity shaking with prolonged post  ictal period back in march 2022. He was evaluated by Dr. Livia Snellen and a video of the events was reviewed by her and felt to be consistent with partial seizures with impaired awareness. Routine EEG was negative and imaging with MRI with no significant abnormality. His Pregabalin was increased to 100mg  TID and continued on Temazepam 15mg  QHDS and he has not had any similar events since.  Primary Diagnosis:  Suspected cerebral infarction due to occlusion of stenosis of small artery. Rule out Cervical spine lesion  Secondary Diagnosis:   Recommendations  - Will get MRI Cervical spine without contrast to evaluate for a potential cervical spine lesion. - continue Pregabalin 100mg  TID - continue Temazepam 15mg  QHS - Recommend outpatient EMG with NCS - Recommend PT and OT. ______________________________________________________________________   Thank you for the opportunity to take part in the care of this patient. If you have any further questions, please contact the neurology consultation attending.  Signed,  [place] Pager Number [phone] _ _ _   _ __   _ __ _ _  __ __   _ __   __ _

## 2021-02-08 NOTE — ED Notes (Signed)
Rn attempted report x1

## 2021-02-08 NOTE — ED Triage Notes (Signed)
PT BIB EMS from home. Pt has a hx of afib and usually can convert himself out of it. Pts HR is 120-160. Pt is axox4. Pt was put on 2L by EMS.

## 2021-02-08 NOTE — H&P (Addendum)
Felt History and Physical    Lawrence [name] FOA:[id number] DOB: July 09, 1956 DOA: 02/08/2021  Referring MD/NP/PA: Derwood Kaplan, MD PCP: [name], PA-C  Patient coming from: home  Chief Complaint: Malaise  I have personally briefly reviewed patient's old medical records in [facility]   HPI: Lawrence [name] is a 65 y.o. male with medical history significant of hypertension, hyperlipidemia, DM type II, COPD,  paroxysmal atrial fibrillation on Eliquis, diastolic CHF( last EF 70-75%), moderate aortic stenosis, seizure disorder, OSA on BiPAP, gout, and depression presents with complaints of malaise which started yesterday.  He normally feels like this when he goes into atrial fibrillation.  That evening he started having some left lower extremity weakness, but symptoms persisted today and he still felt no better.  Patient was noted to be more lethargic than usual with reports of intermittent staring off spells.  His girlfriend notes that he has had increasing redness of his lower extremities over the last few days.  Redness is worse on the right leg as opposed to the left.  He had last been treated for cellulitis 3-4 months ago.  Other associated symptoms include tingling sensations of both legs.  He reports that his weight is actually down to 382 pounds and his efforts to try and lose weight denies any fever, chest pain, shortness of breath, nausea, vomiting, diarrhea, or seizure-like activity symptoms.  Patient had just recently been hospitalized from 6/20-6/21 with transient episode of left-sided weakness which MRI of the brain was negative for any acute abnormality.  Symptoms were thought possibly related to seizure-like activity, but EEG was negative.  Patient was recommended to continue on Lyrica 100 mg 3 times daily.  ED Course: Upon admission into the emergency department patient was seen to be in atrial fibrillation with heart rates elevated up to 150s, and all other vital signs  maintained.  Labs significant for WBC 12.2, potassium 4.1, magnesium 2.1, and glucose 207.  CT scan of the brain showed no acute abnormalities.  Patient had been given 1 L normal saline IV fluids and started on Cardizem drip.  Metoprolol IV which had initially been ordered was never given due to patient's low blood pressures. TRH called to admit.  Review of Systems  Constitutional:  Positive for malaise/fatigue. Negative for fever.  HENT:  Negative for congestion.   Eyes:  Negative for photophobia and pain.  Respiratory:  Negative for cough and shortness of breath.   Cardiovascular:  Positive for leg swelling. Negative for chest pain.  Gastrointestinal:  Negative for diarrhea, nausea and vomiting.  Genitourinary:  Negative for dysuria and frequency.  Musculoskeletal:  Negative for falls.  Skin:        Positive for skin color change  Neurological:  Positive for sensory change and weakness. Negative for seizures.  Psychiatric/Behavioral:  Negative for substance abuse.    Past Medical History:  Diagnosis Date   Acquired dilation of ascending aorta and aortic root (HCC)    21mm by echo 01/2021   Acute on chronic diastolic CHF (congestive heart failure) (HCC) 01/13/2018   Acute respiratory failure with hypoxia (HCC) 01/13/2018   Adenomatous colon polyp 2007   Anasarca 01/31/2017   Anemia    Anxiety    Aortic stenosis    moderate AS by echo 01/2021   Arthritis    Asthma    Bilateral lower extremity edema 01/31/2017   Bilateral lower leg cellulitis    BPH without obstruction/lower urinary tract symptoms 02/22/2017   Cellulitis 11/07/2017  Cellulitis of lower extremity    Chest pain    Chronic cough    Chronic diastolic CHF (congestive heart failure) (HCC)    Chronic venous stasis 03/07/2019   COPD (chronic obstructive pulmonary disease) (HCC)    Coronary artery calcification seen on CAT scan    Depression    Diabetic neuropathy ([place]) 09/11/2019   Hematuria 02/22/2017   History of  anemia 12/26/2018   History of cardiac cath    a.  01/2018 with angiographically minimal coronary disease with  up to 40% prox LAD and 20% mid LCx and OM2 with normal LVF and normal LVEDP.[name]   History of colon polyps 08/24/2018   Hypertension    Leukocytosis 08/18/2018   Morbid obesity ([place])    New onset type 2 diabetes mellitus ([place]) 11/08/2017   OSA (obstructive sleep apnea)    Pain due to onychomycosis of toenails of both feet 09/11/2019   Peripheral neuropathy 02/22/2017   Physical deconditioning 02/22/2017   Primary osteoarthritis, left shoulder 03/05/2017   PTSD (post-traumatic stress disorder)    Pure hypercholesterolemia    QT prolongation 03/07/2019   Right foot pain 01/13/2018   Shortness of breath    Sinus tachycardia 03/07/2019   Sleep apnea    CPAP   Type 2 diabetes mellitus with vascular disease ([place]) 09/11/2019    Past Surgical History:  Procedure Laterality Date   ENDOVENOUS ABLATION SAPHENOUS VEIN W/ LASER Right 08/20/2020   endovenous laser ablation right greater saphenous vein by Gae Gallop MD    JOINT REPLACEMENT     left knee replacement x 2   KNEE ARTHROSCOPY Bilateral    LEFT HEART CATH AND CORONARY ANGIOGRAPHY N/A 01/17/2018   Procedure: LEFT HEART CATH AND CORONARY ANGIOGRAPHY;  Surgeon: Leonie Man, MD;  Location: [facility];  Service: Cardiovascular;  Laterality: N/A;   UMBILICAL HERNIA REPAIR       reports that he quit smoking about 4 years ago. He has never used smokeless tobacco. He reports previous alcohol use. He reports that he does not use drugs.  Allergies  Allergen Reactions   Vancomycin Other (See Comments)    "Red Man Syndrome" 02/02/17: possible cause for rash under both arms   Niacin Other (See Comments)   Niacin And Related Other (See Comments)    Red man syndrome   Tubersol [Tuberculin] Other (See Comments)    Reaction unknown   Doxycycline Rash and Other (See Comments)    Family History  Problem Relation Age of  Onset   CAD Maternal Grandfather    Diabetes Other    Diabetes Mellitus II Neg Hx    Colon cancer Neg Hx    Esophageal cancer Neg Hx    Inflammatory bowel disease Neg Hx    Liver disease Neg Hx    Pancreatic cancer Neg Hx    Rectal cancer Neg Hx    Stomach cancer Neg Hx     Prior to Admission medications   Medication Sig Start Date End Date Taking? Authorizing Provider  Accu-Chek FastClix Lancets MISC 1 each by Other route in the morning and at bedtime. 12/22/20   [provider]  acetaminophen (TYLENOL) 325 MG tablet Take 2 tablets (650 mg total) by mouth every 4 (four) hours as needed for mild pain (or temp > 37.5 C (99.5 F)). 01/26/21   Cherene Altes, MD  albuterol (VENTOLIN HFA) 108 (90 Base) MCG/ACT inhaler Inhale 2 puffs into the lungs every 6 (six) hours as needed  for wheezing or shortness of breath. 03/10/20   [provider]  Alcohol Swabs (ALCOHOL PREP) 70 % PADS 1 each by Other route in the morning, at noon, and at bedtime. 12/22/20   [provider]  allopurinol (ZYLOPRIM) 100 MG tablet Take 100 mg by mouth 2 (two) times daily.    [provider]  apixaban (ELIQUIS) 5 MG TABS tablet Take 1 tablet (5 mg total) by mouth 2 (two) times daily. 01/07/21   Sueanne Margarita, MD  ARIPiprazole (ABILIFY) 5 MG tablet Take 1 tablet (5 mg total) by mouth at bedtime. 01/08/21   Mozingo, Berdie Ogren, NP  atorvastatin (LIPITOR) 40 MG tablet TAKE ONE TABLET BY MOUTH DAILY AT 6PM 01/07/21   Sueanne Margarita, MD  B-D ULTRAFINE III SHORT PEN 31G X 8 MM MISC 1 each by Other route See admin instructions. Use to inject insulin subcutaneously at bedtime as directed. 11/20/20   [provider]  blood glucose meter kit and supplies Dispense based on patient and insurance preference. Use up to four times daily as directed. (FOR ICD-10 E10.9, E11.9). 11/09/17   Eugenie Filler, MD  busPIRone (BUSPAR) 5 MG tablet Take 1 tablet (5 mg total) by mouth 3 (three) times  daily. 12/10/20   Mozingo, Berdie Ogren, NP  cetirizine (ZYRTEC) 10 MG tablet Take 10 mg by mouth at bedtime.     [provider]  dapagliflozin propanediol (FARXIGA) 10 MG TABS tablet Take 1 tablet (10 mg total) by mouth daily before breakfast. 01/05/21   Renato Shin, MD  Dulaglutide (TRULICITY) 4.5 SW/1.0XN SOPN Inject 4.5 mg as directed once a week. 11/19/20   Renato Shin, MD  ferrous sulfate 324 (65 Fe) MG TBEC Take 324 mg by mouth daily with breakfast.     [provider]  fluticasone (FLONASE) 50 MCG/ACT nasal spray Place 2 sprays into both nostrils daily.     [provider]  isosorbide mononitrate (IMDUR) 30 MG 24 hr tablet Take 1 tablet (30 mg total) by mouth daily. 01/07/21   Sueanne Margarita, MD  LANTUS SOLOSTAR 100 UNIT/ML Solostar Pen Inject 12 Units into the skin every morning. 11/19/20   Renato Shin, MD  metFORMIN (GLUCOPHAGE) 500 MG tablet Take 1,000 mg by mouth 2 (two) times daily with a meal.    [provider]  metoprolol succinate (TOPROL-XL) 25 MG 24 hr tablet Take 1 tablet (25 mg total) by mouth daily. 01/07/21   Sueanne Margarita, MD  omeprazole (PRILOSEC) 20 MG capsule Take 20 mg by mouth every morning. 05/29/20   [provider]  oxyCODONE (OXY IR/ROXICODONE) 5 MG immediate release tablet Take 5 mg by mouth every 8 (eight) hours as needed for severe pain. 09/17/20   [provider]  potassium chloride SA (KLOR-CON) 20 MEQ tablet Take 2 tablets (40 mEq total) by mouth daily. 01/07/21   Sueanne Margarita, MD  prazosin (MINIPRESS) 2 MG capsule Take 2 capsules (4 mg total) by mouth at bedtime. 01/07/21   Sueanne Margarita, MD  pregabalin (LYRICA) 100 MG capsule Take 100 mg by mouth in the morning, at noon, and at bedtime.  07/14/19   [provider]  silver sulfADIAZINE (SILVADENE) 1 % cream Apply 1 application topically See admin instructions. Apply as directed once a day as directed to bilateral legs    [provider]   spironolactone (ALDACTONE) 25 MG tablet Take 1 tablet (25 mg total) by mouth daily. 01/07/21   Fransico Him  R, MD  temazepam (RESTORIL) 15 MG capsule Take 1 capsule (15 mg total) by mouth at bedtime. 12/22/20   Mozingo, Berdie Ogren, NP  torsemide (DEMADEX) 20 MG tablet Take 2 tablets (40 mg total) in the morning and 1 tablet (20 mg total) in the evening. 01/07/21   Sueanne Margarita, MD  traZODone (DESYREL) 100 MG tablet Take 2 tablets (200 mg total) by mouth at bedtime. 12/31/20   Mozingo, Berdie Ogren, NP  venlafaxine XR (EFFEXOR-XR) 75 MG 24 hr capsule Take 3 capsules (225 mg total) by mouth daily with breakfast. 12/10/20   Mozingo, Berdie Ogren, NP  Vitamin D, Ergocalciferol, (DRISDOL) 1.25 MG (50000 UNIT) CAPS capsule Take 50,000 Units by mouth every 7 (seven) days.    [provider]    Physical Exam:  Constitutional: Morbidly obese male currently in no acute distress Vitals:   02/08/21 1530 02/08/21 1545 02/08/21 1600 02/08/21 1615  BP: 114/89 (!) 114/91 108/71 94/68  Pulse: (!) 119 (!) 154 84 (!) 44  Resp: $Remo'18 20 19 15  'rzzNm$ Temp:      TempSrc:      SpO2: 95% 95% 93% 96%   Eyes: PERRL, lids and conjunctivae normal ENMT: Mucous membranes are moist. Posterior pharynx clear of any exudate or lesions.  Neck: normal, supple, no masses, no thyromegaly Respiratory: Normal respiratory effort with decreased overall aeration.  No significant wheezes or rhonchi appreciated. Cardiovascular: Irregular irregular rhythm.  +1 pitting bilateral lower extremity edema. Abdomen: no tenderness, no masses palpated. No hepatosplenomegaly. Bowel sounds positive.  Musculoskeletal: no clubbing / cyanosis. No joint deformity upper and lower extremities. Good ROM, no contractures. Normal muscle tone.  Skin: no rashes, lesions, ulcers. No induration Neurologic: CN 2-12 grossly intact. Strength 4/5 in the left lower extremity.  Strength 5/5 in all other extremities. Psychiatric: Normal judgment and  insight. Alert and oriented x 3. Normal mood.     Labs on Admission: I have personally reviewed following labs and imaging studies  CBC: Recent Labs  Lab 02/08/21 1536  WBC 12.2*  HGB 14.3  HCT 44.2  MCV 91.3  PLT 097   Basic Metabolic Panel: Recent Labs  Lab 02/08/21 1536  NA 135  K 4.1  CL 96*  CO2 26  GLUCOSE 207*  BUN 21  CREATININE 0.93  CALCIUM 9.5  MG 2.1   GFR: Estimated Creatinine Clearance: 137.6 mL/min (by C-G formula based on SCr of 0.93 mg/dL). Liver Function Tests: No results for input(s): AST, ALT, ALKPHOS, BILITOT, PROT, ALBUMIN in the last 168 hours. No results for input(s): LIPASE, AMYLASE in the last 168 hours. No results for input(s): AMMONIA in the last 168 hours. Coagulation Profile: No results for input(s): INR, PROTIME in the last 168 hours. Cardiac Enzymes: No results for input(s): CKTOTAL, CKMB, CKMBINDEX, TROPONINI in the last 168 hours. BNP (last 3 results) No results for input(s): PROBNP in the last 8760 hours. HbA1C: No results for input(s): HGBA1C in the last 72 hours. CBG: No results for input(s): GLUCAP in the last 168 hours. Lipid Profile: No results for input(s): CHOL, HDL, LDLCALC, TRIG, CHOLHDL, LDLDIRECT in the last 72 hours. Thyroid Function Tests: No results for input(s): TSH, T4TOTAL, FREET4, T3FREE, THYROIDAB in the last 72 hours. Anemia Panel: No results for input(s): VITAMINB12, FOLATE, FERRITIN, TIBC, IRON, RETICCTPCT in the last 72 hours. Urine analysis:    Component Value Date/Time   COLORURINE STRAW (A) 01/25/2021 2000   APPEARANCEUR CLEAR 01/25/2021 2000   LABSPEC 1.015 01/25/2021 2000  PHURINE 7.0 01/25/2021 2000   GLUCOSEU >=500 (A) 01/25/2021 2000   HGBUR NEGATIVE 01/25/2021 2000   BILIRUBINUR NEGATIVE 01/25/2021 2000   KETONESUR NEGATIVE 01/25/2021 2000   PROTEINUR NEGATIVE 01/25/2021 2000   NITRITE NEGATIVE 01/25/2021 2000   LEUKOCYTESUR NEGATIVE 01/25/2021 2000   Sepsis Labs: No results found  for this or any previous visit (from the past 240 hour(s)).   Radiological Exams on Admission: CT Head Wo Contrast  Result Date: 02/08/2021 CLINICAL DATA:  PT BIB EMS from home. Pt has a hx of afib and usually can convert himself out of it. Pts HR is 120-160. Pt is axox4. Pt was put on 2L by EMS. EXAM: CT HEAD WITHOUT CONTRAST TECHNIQUE: Contiguous axial images were obtained from the base of the skull through the vertex without intravenous contrast. COMPARISON:  01/25/2021 FINDINGS: Brain: No evidence of acute infarction, hemorrhage, hydrocephalus, extra-axial collection or mass lesion/mass effect. Mild periventricular white matter hypoattenuation noted consistent with chronic microvascular ischemic change. Vascular: No hyperdense vessel or unexpected calcification. Skull: Normal. Negative for fracture or focal lesion. Sinuses/Orbits: Small amount of dependent fluid in the left sphenoid sinus. Mild scattered areas of ethmoid sinus mucosal thickening. Mild polypoid mucosal thickening at the floor of the right maxillary sinus. Other: None. IMPRESSION: 1. No acute intracranial abnormalities. 2. Mild chronic microvascular ischemic change. Electronically Signed   By: Lajean Manes M.D.   On: 02/08/2021 13:05    EKG: Independently reviewed.  Atrial fibrillation with RVR at 159 beats  Assessment/Plan Atrial fibrillation with RVR on chronic anticoagulation: Patient presents in A. fib with RVR with heart rates into the 150s.  He reports that he had taken his metoprolol as prescribed this morning. -Admit to a progressive bed -Continue Cardizem drip as tolerated -Continue Eliquis -Message sent for cardiology to eval in a.m.  Leukocytosis: Acute.  WBC elevated 12.2.  Suspect possibly related to cellulitis of the lower extremities. -Continue to monitor  Left-sided weakness: During prior hospitalization in June patient had similar left-sided weakness.  Symptoms thought to be secondary to a TIA at that time his  MRI was negative.  However, it appears that it was also brought up the possibility of partial seizures as the cause of patient's symptoms. -Neurochecks -Follow-up MRI of the brain -Physical therapy to eval and treat -Neurology consulted to formally evaluate  Cellulitis of the lower extremities: Acute.  Patient noted to have increasing redness of the bilateral lower extremities over the last couple of days.  Increased warmth to palpation noted. -Orders placed to demarcate areas of erythema for clinical monitoring -Rocephin IV  Diabetes mellitus type II: On admission glucose 207.  Home insulin regimen includes Farxiga 10 mg daily with breakfast, Trulicity 4.5 mg weekly, Lantus 12 units every morning, and metformin 1000 mg twice daily. -Hypoglycemic protocols -Continue Lantus 12 units daily  Chronic diastolic congestive heart failure: Patient does not appear grossly fluid overloaded at this time. -Strict intake and output -Daily weightss -Check chest x-ray -Continue torsemide  Seizure disorder: Patient had previously been observed having possible seizures during hospitalization in March of this year and was discharged home on Keppra.  However, patient did not continue after prescription ran out.  During last hospitalization in June neurology evaluated and recommended continue Lyrica 3 times daily.  It was brought up the possibility of increasing his Lyrica dose during last hospitalization 150 mg 3 times daily.  However this was not done as symptoms were thought more so secondary to a TIA.   -Continue  Lyrica at current dose until evaluated by neurology  Essential hypertension: On admission blood pressures have remained soft blood pressures noted to be around 90/77-107/50 at times. -Held isosorbide mononitrate, prazosin, spironolactone -Continue metoprolol in a.m. -Continue torsemide as tolerated  COPD: Patient without acute exacerbation at this time. -DuoNeb treatments as needed for  shortness of breath/wheeze  Depression and anxiety -Continue Abilify, BuSpar, and Effexor(but consider possibility of this combination of medications provoking patient's symptoms are)  Hyperlipidemia -Continue atorvastatin  OSA -Continue BiPAP nightly  Morbid obesity: BMI 49 kg/m  DNR: present on admission  DVT prophylaxis: Eliquis Code Status: DNR Family Communication: Girlfriend updated at bedside Disposition Plan: Hopefully discharge home once medically stable Consults called: None Admission status: Observation  Norval Morton MD [facility]   If 7PM-7AM, please contact night-coverage   02/08/2021, 4:24 PM

## 2021-02-09 ENCOUNTER — Observation Stay (HOSPITAL_COMMUNITY): Payer: Medicare Other

## 2021-02-09 ENCOUNTER — Encounter (HOSPITAL_COMMUNITY): Payer: Self-pay | Admitting: Internal Medicine

## 2021-02-09 DIAGNOSIS — F4310 Post-traumatic stress disorder, unspecified: Secondary | ICD-10-CM | POA: Diagnosis present

## 2021-02-09 DIAGNOSIS — D509 Iron deficiency anemia, unspecified: Secondary | ICD-10-CM | POA: Diagnosis present

## 2021-02-09 DIAGNOSIS — R531 Weakness: Secondary | ICD-10-CM | POA: Diagnosis not present

## 2021-02-09 DIAGNOSIS — L03116 Cellulitis of left lower limb: Secondary | ICD-10-CM | POA: Diagnosis present

## 2021-02-09 DIAGNOSIS — E876 Hypokalemia: Secondary | ICD-10-CM | POA: Diagnosis present

## 2021-02-09 DIAGNOSIS — K219 Gastro-esophageal reflux disease without esophagitis: Secondary | ICD-10-CM | POA: Diagnosis present

## 2021-02-09 DIAGNOSIS — I110 Hypertensive heart disease with heart failure: Secondary | ICD-10-CM | POA: Diagnosis present

## 2021-02-09 DIAGNOSIS — I2510 Atherosclerotic heart disease of native coronary artery without angina pectoris: Secondary | ICD-10-CM | POA: Diagnosis present

## 2021-02-09 DIAGNOSIS — F32A Depression, unspecified: Secondary | ICD-10-CM | POA: Diagnosis present

## 2021-02-09 DIAGNOSIS — Z794 Long term (current) use of insulin: Secondary | ICD-10-CM | POA: Diagnosis not present

## 2021-02-09 DIAGNOSIS — R29810 Facial weakness: Secondary | ICD-10-CM | POA: Diagnosis present

## 2021-02-09 DIAGNOSIS — Z20822 Contact with and (suspected) exposure to covid-19: Secondary | ICD-10-CM | POA: Diagnosis present

## 2021-02-09 DIAGNOSIS — L03115 Cellulitis of right lower limb: Secondary | ICD-10-CM | POA: Diagnosis present

## 2021-02-09 DIAGNOSIS — G40909 Epilepsy, unspecified, not intractable, without status epilepticus: Secondary | ICD-10-CM | POA: Diagnosis present

## 2021-02-09 DIAGNOSIS — I4891 Unspecified atrial fibrillation: Secondary | ICD-10-CM | POA: Diagnosis present

## 2021-02-09 DIAGNOSIS — E7800 Pure hypercholesterolemia, unspecified: Secondary | ICD-10-CM | POA: Diagnosis present

## 2021-02-09 DIAGNOSIS — I480 Paroxysmal atrial fibrillation: Secondary | ICD-10-CM | POA: Diagnosis present

## 2021-02-09 DIAGNOSIS — Z66 Do not resuscitate: Secondary | ICD-10-CM | POA: Diagnosis present

## 2021-02-09 DIAGNOSIS — E1142 Type 2 diabetes mellitus with diabetic polyneuropathy: Secondary | ICD-10-CM | POA: Diagnosis present

## 2021-02-09 DIAGNOSIS — Z79899 Other long term (current) drug therapy: Secondary | ICD-10-CM | POA: Diagnosis not present

## 2021-02-09 DIAGNOSIS — J449 Chronic obstructive pulmonary disease, unspecified: Secondary | ICD-10-CM | POA: Diagnosis present

## 2021-02-09 DIAGNOSIS — G4733 Obstructive sleep apnea (adult) (pediatric): Secondary | ICD-10-CM | POA: Diagnosis present

## 2021-02-09 DIAGNOSIS — G4700 Insomnia, unspecified: Secondary | ICD-10-CM | POA: Diagnosis present

## 2021-02-09 DIAGNOSIS — I5032 Chronic diastolic (congestive) heart failure: Secondary | ICD-10-CM | POA: Diagnosis present

## 2021-02-09 DIAGNOSIS — Z6841 Body Mass Index (BMI) 40.0 and over, adult: Secondary | ICD-10-CM | POA: Diagnosis not present

## 2021-02-09 LAB — CBC
HCT: 41.3 % (ref 39.0–52.0)
Hemoglobin: 13.4 g/dL (ref 13.0–17.0)
MCH: 29.4 pg (ref 26.0–34.0)
MCHC: 32.4 g/dL (ref 30.0–36.0)
MCV: 90.6 fL (ref 80.0–100.0)
Platelets: 314 10*3/uL (ref 150–400)
RBC: 4.56 MIL/uL (ref 4.22–5.81)
RDW: 15.3 % (ref 11.5–15.5)
WBC: 11.9 10*3/uL — ABNORMAL HIGH (ref 4.0–10.5)
nRBC: 0 % (ref 0.0–0.2)

## 2021-02-09 LAB — BASIC METABOLIC PANEL
Anion gap: 7 (ref 5–15)
BUN: 16 mg/dL (ref 8–23)
CO2: 31 mmol/L (ref 22–32)
Calcium: 8.8 mg/dL — ABNORMAL LOW (ref 8.9–10.3)
Chloride: 100 mmol/L (ref 98–111)
Creatinine, Ser: 0.89 mg/dL (ref 0.61–1.24)
GFR, Estimated: >60 mL/min (ref >60)
Glucose, Bld: 160 mg/dL — ABNORMAL HIGH (ref 70–99)
Potassium: 3.4 mmol/L — ABNORMAL LOW (ref 3.5–5.1)
Sodium: 138 mmol/L (ref 135–145)

## 2021-02-09 LAB — GLUCOSE, CAPILLARY
Glucose-Capillary: 237 mg/dL — ABNORMAL HIGH (ref 70–99)
Glucose-Capillary: 270 mg/dL — ABNORMAL HIGH (ref 70–99)
Glucose-Capillary: 181 mg/dL — ABNORMAL HIGH (ref 70–99)
Glucose-Capillary: 161 mg/dL — ABNORMAL HIGH (ref 70–99)

## 2021-02-09 LAB — TSH: TSH: 1.442 u[IU]/mL (ref 0.350–4.500)

## 2021-02-09 LAB — MAGNESIUM: Magnesium: 2.2 mg/dL (ref 1.7–2.4)

## 2021-02-09 MED ORDER — MORPHINE SULFATE (PF) 2 MG/ML IV SOLN
1.0000 mg | INTRAVENOUS | Status: DC | PRN
  Administered 2021-02-09: 2 mg via INTRAVENOUS
  Administered 2021-02-10: 1 mg via INTRAVENOUS
  Filled 2021-02-09 (×2): qty 1

## 2021-02-09 MED ORDER — METOPROLOL TARTRATE 5 MG/5ML IV SOLN
2.5000 mg | INTRAVENOUS | Status: DC | PRN
  Administered 2021-02-09: 2.5 mg via INTRAVENOUS
  Filled 2021-02-09: qty 5

## 2021-02-09 MED ORDER — SPIRONOLACTONE 25 MG PO TABS
25.0000 mg | ORAL_TABLET | Freq: Every day | ORAL | Status: DC
  Administered 2021-02-09 – 2021-02-11 (×3): 25 mg via ORAL
  Filled 2021-02-09 (×3): qty 1

## 2021-02-09 MED ORDER — METOPROLOL SUCCINATE ER 50 MG PO TB24
50.0000 mg | ORAL_TABLET | Freq: Every day | ORAL | Status: DC
  Administered 2021-02-10 – 2021-02-11 (×2): 50 mg via ORAL
  Filled 2021-02-09 (×2): qty 1

## 2021-02-09 MED ORDER — INSULIN ASPART 100 UNIT/ML IJ SOLN
0.0000 [IU] | Freq: Three times a day (TID) | INTRAMUSCULAR | Status: DC
  Administered 2021-02-09: 5 [IU] via SUBCUTANEOUS
  Administered 2021-02-09: 4 [IU] via SUBCUTANEOUS
  Administered 2021-02-10 – 2021-02-11 (×6): 2 [IU] via SUBCUTANEOUS

## 2021-02-09 MED ORDER — INSULIN ASPART 100 UNIT/ML IJ SOLN
4.0000 [IU] | Freq: Three times a day (TID) | INTRAMUSCULAR | Status: DC
  Administered 2021-02-09 – 2021-02-11 (×7): 4 [IU] via SUBCUTANEOUS

## 2021-02-09 MED ORDER — METOPROLOL SUCCINATE ER 25 MG PO TB24
25.0000 mg | ORAL_TABLET | Freq: Once | ORAL | Status: AC
  Administered 2021-02-09: 25 mg via ORAL
  Filled 2021-02-09: qty 1

## 2021-02-09 MED ORDER — INSULIN ASPART 100 UNIT/ML IJ SOLN: 0.0000 [IU] | Freq: Every day | INTRAMUSCULAR | Status: DC

## 2021-02-09 NOTE — Consult Note (Addendum)
Cardiology Consultation:   Patient ID: Rayaan Garguilo MRN: [id number]; DOB: 22-Mar-1956  Admit date: 02/08/2021 Date of Consult: 02/09/2021  PCP:  [name], [place] Providers Cardiologist:  Fransico Him, MD  Electrophysiologist:  Constance Haw, MD  {     Patient Profile:   Oriel Rumbold is a 65 y.o. male with a hx of  hypertension, hyperlipidemia, DM type II, COPD,  paroxysmal atrial fibrillation on Eliquis, diastolic CHF( last EF 27-03%), moderate aortic stenosis, seizure disorder, OSA on BiPAP, gout, and depression, who is being seen 02/09/2021 for the evaluation of A fib  RVR at the request of Dr. Tamala Julian.  History of Present Illness:   Mr. Shedd follows Dr Radford Pax outpatient primarily.   He has history of nonobstructive CAD, LHC from 01/17/2018 revealed proximal LAD to mid LAD lesion 40% stenosis, proximal Cx to mid Cx lesion 20% stenosis, Ost 2nd Mrg lesion 205 stenosis.  He had recurrent complaint of chest pain which led to a Myoview on 11/24/2020, showed a low risk study that is normal.   He was hospitalized in March 2022 for acute hypoxic respiratory failure and acute on chronic diastolic heart failure.  Was diuresed 18 L placed on 1200 fluid restriction for hyponatremia.  Telemetry during the hospitalization showed SVT , could not discern whether it was afib or PAT. He was maintained on metoprolol.   He initially presented to ER on 02/19/2019 with heart palpitation, subsequent Zio patch revealed multiple episodes of SVT.  He was referred to EP Dr. Curt Bears 04/2019, subsequently placed on higher dose metoprolol for symptom control.  He wore a monitor after March 2022 hospitalization for CHF, which revealed short episodes of atrial fibrillation.  Later he followed up with EP on 12/09/2020, reportedly had 1% burden of A. fib from his monitor.  He was initiated on anticoagulation with Eliquis 5 mg twice daily and stopped aspirin.   He was last seen by Dr. Radford Pax in  the office on 01/07/2021, before that he called in complaining increased chest pain and anxiety and stress, was started on Imdur and BuSpar which seem to help to improving his symptoms.  During last visit, he continued to complain chest pain occurs with emotional stress and anxiety, not exertional activity.  He had a stable chronic lower extremity edema and DOE.   He was recently hospitalized here on 01/25/21 -01/27/21 for acute onset slurred speech and left-sided weakness, stroke work-up was negative.  EEG without acute seizure.   He came to the ER today complaining weakness, fatigue, dizziness.  He feels like he is going into atrial fibrillation.  He also complains of new onset of left lower extremity weakness.  He states he went into A. fib since Sunday, 02/07/2021, where his home machine told him that.  He had intermittent dizziness, left axillary pain, heart palpitation, and flutter sensation.  He also noted new onset of worsening erythema of his bilateral lower extremity with tenderness.  He states his heart palpitation and dizziness had resolved currently.  He has been taking his metoprolol daily and is compliant with Eliquis.  He states he has actually lost weight and leg swelling are much improved than usual. He denied orthopnea.   Admission diagnostic leukocytosis with WBC 12.2, BMP with glucose 207 otherwise unremarkable. COVID-19 negative. CTH no acute findings. CXR no acute findings. MRI of brain no acute CVA. EKG with A fib RVR 159 bpm. He was admitted to hospitalist and started on cardizem gtt. Cardiology is consulted  for further input. Neurology consulted for left sided weakness, concern of cervical spine lesion and recommend MRI of C spine.    Past Medical History:  Diagnosis Date   Acquired dilation of ascending aorta and aortic root ([place])    12m by echo 01/2021   Acute on chronic diastolic CHF (congestive heart failure) ([facility] 01/13/2018   Acute respiratory failure with hypoxia ([facility]  01/13/2018   Adenomatous colon polyp 2007   Anasarca 01/31/2017   Anemia    Anxiety    Aortic stenosis    moderate AS by echo 01/2021   Arthritis    Asthma    Bilateral lower extremity edema 01/31/2017   Bilateral lower leg cellulitis    BPH without obstruction/lower urinary tract symptoms 02/22/2017   Cellulitis 11/07/2017   Cellulitis of lower extremity    Chest pain    Chronic cough    Chronic diastolic CHF (congestive heart failure) (HCC)    Chronic venous stasis 03/07/2019   COPD (chronic obstructive pulmonary disease) (HCC)    Coronary artery calcification seen on CAT scan    Depression    Diabetic neuropathy ([facility] 09/11/2019   Hematuria 02/22/2017   History of anemia 12/26/2018   History of cardiac cath    a.  01/2018 with angiographically minimal coronary disease with  up to 40% prox LAD and 20% mid LCx and OM2 with normal LVF and normal LVEDP..[name]  History of colon polyps 08/24/2018   Hypertension    Leukocytosis 08/18/2018   Morbid obesity ([facility]    New onset type 2 diabetes mellitus ([facility] 11/08/2017   OSA (obstructive sleep apnea)    Pain due to onychomycosis of toenails of both feet 09/11/2019   Peripheral neuropathy 02/22/2017   Physical deconditioning 02/22/2017   Primary osteoarthritis, left shoulder 03/05/2017   PTSD (post-traumatic stress disorder)    Pure hypercholesterolemia    QT prolongation 03/07/2019   Right foot pain 01/13/2018   Shortness of breath    Sinus tachycardia 03/07/2019   Sleep apnea    CPAP   Type 2 diabetes mellitus with vascular disease ([facility] 09/11/2019    Past Surgical History:  Procedure Laterality Date   ENDOVENOUS ABLATION SAPHENOUS VEIN W/ LASER Right 08/20/2020   endovenous laser ablation right greater saphenous vein by CGae GallopMD    JOINT REPLACEMENT     left knee replacement x 2   KNEE ARTHROSCOPY Bilateral    LEFT HEART CATH AND CORONARY ANGIOGRAPHY N/A 01/17/2018   Procedure: LEFT HEART CATH AND CORONARY  ANGIOGRAPHY;  Surgeon: HLeonie Man MD;  [facility];  Service: Cardiovascular;  Laterality: N/A;   UMBILICAL HERNIA REPAIR       Home Medications:  Prior to Admission medications   Medication Sig Start Date End Date Taking? Authorizing Provider  Accu-Chek FastClix Lancets MISC 1 each by Other route in the morning and at bedtime. 12/22/20  Yes [provider]  acetaminophen (TYLENOL) 325 MG tablet Take 2 tablets (650 mg total) by mouth every 4 (four) hours as needed for mild pain (or temp > 37.5 C (99.5 F)). 01/26/21  Yes MCherene Altes MD  albuterol (VENTOLIN HFA) 108 (90 Base) MCG/ACT inhaler Inhale 2 puffs into the lungs every 6 (six) hours as needed for wheezing or shortness of breath. 03/10/20  Yes [provider]  Alcohol Swabs (ALCOHOL PREP) 70 % PADS 1 each by Other route in the morning, at noon, and at bedtime. 12/22/20  Yes [provider]  allopurinol (ZYLOPRIM) 100 MG tablet Take 100 mg by mouth 2 (two) times daily.   Yes [provider]  apixaban (ELIQUIS) 5 MG TABS tablet Take 1 tablet (5 mg total) by mouth 2 (two) times daily. 01/07/21  Yes Turner, Eber Hong, MD  ARIPiprazole (ABILIFY) 5 MG tablet Take 1 tablet (5 mg total) by mouth at bedtime. 01/08/21  Yes Mozingo, Berdie Ogren, NP  atorvastatin (LIPITOR) 40 MG tablet TAKE ONE TABLET BY MOUTH DAILY AT 6PM Patient taking differently: Take 40 mg by mouth every evening. 01/07/21  Yes Turner, Eber Hong, MD  B-D ULTRAFINE III SHORT PEN 31G X 8 MM MISC 1 each by Other route See admin instructions. Use to inject insulin subcutaneously at bedtime as directed. 11/20/20  Yes [provider]  blood glucose meter kit and supplies Dispense based on patient and insurance preference. Use up to four times daily as directed. (FOR ICD-10 E10.9, E11.9). 11/09/17  Yes Eugenie Filler, MD  busPIRone (BUSPAR) 5 MG tablet Take 1 tablet (5 mg total) by mouth 3 (three) times daily. 12/10/20  Yes  Mozingo, Berdie Ogren, NP  cetirizine (ZYRTEC) 10 MG tablet Take 10 mg by mouth at bedtime.    Yes [provider]  dapagliflozin propanediol (FARXIGA) 10 MG TABS tablet Take 1 tablet (10 mg total) by mouth daily before breakfast. 01/05/21  Yes Renato Shin, MD  Dulaglutide (TRULICITY) 4.5 TO/6.7TI SOPN Inject 4.5 mg as directed once a week. Patient taking differently: Inject 4.5 mg as directed once a week. Mondays 11/19/20  Yes Renato Shin, MD  ferrous sulfate 324 (65 Fe) MG TBEC Take 324 mg by mouth daily with breakfast.    Yes [provider]  fluticasone (FLONASE) 50 MCG/ACT nasal spray Place 2 sprays into both nostrils daily.    Yes [provider]  isosorbide mononitrate (IMDUR) 30 MG 24 hr tablet Take 1 tablet (30 mg total) by mouth daily. 01/07/21  Yes Turner, Eber Hong, MD  LANTUS SOLOSTAR 100 UNIT/ML Solostar Pen Inject 12 Units into the skin every morning. 11/19/20  Yes Renato Shin, MD  metFORMIN (GLUCOPHAGE-XR) 500 MG 24 hr tablet Take 1,000 mg by mouth 2 (two) times daily. 01/22/21  Yes [provider]  metoprolol succinate (TOPROL-XL) 25 MG 24 hr tablet Take 1 tablet (25 mg total) by mouth daily. 01/07/21  Yes Turner, Eber Hong, MD  omeprazole (PRILOSEC) 20 MG capsule Take 20 mg by mouth every morning. 05/29/20  Yes [provider]  oxyCODONE (OXY IR/ROXICODONE) 5 MG immediate release tablet Take 5 mg by mouth every 8 (eight) hours as needed for severe pain. 09/17/20  Yes [provider]  potassium chloride SA (KLOR-CON) 20 MEQ tablet Take 2 tablets (40 mEq total) by mouth daily. 01/07/21  Yes Turner, Eber Hong, MD  prazosin (MINIPRESS) 2 MG capsule Take 2 capsules (4 mg total) by mouth at bedtime. 01/07/21  Yes Turner, Eber Hong, MD  pregabalin (LYRICA) 100 MG capsule Take 100 mg by mouth in the morning, at noon, and at bedtime.  07/14/19  Yes [provider]  silver sulfADIAZINE (SILVADENE) 1 % cream Apply 1 application topically See  admin instructions. Apply as directed once a day as directed to bilateral legs   Yes [provider]  spironolactone (ALDACTONE) 25 MG tablet Take 1 tablet (25 mg total) by mouth daily. 01/07/21  Yes Turner, Eber Hong, MD  temazepam (RESTORIL) 15 MG capsule Take 1 capsule (15 mg total) by mouth at  bedtime. 12/22/20  Yes Mozingo, Berdie Ogren, NP  torsemide (DEMADEX) 20 MG tablet Take 2 tablets (40 mg total) in the morning and 1 tablet (20 mg total) in the evening. 01/07/21  Yes Turner, Eber Hong, MD  traZODone (DESYREL) 100 MG tablet Take 2 tablets (200 mg total) by mouth at bedtime. 12/31/20  Yes Mozingo, Berdie Ogren, NP  venlafaxine XR (EFFEXOR-XR) 75 MG 24 hr capsule Take 3 capsules (225 mg total) by mouth daily with breakfast. 12/10/20  Yes Mozingo, Berdie Ogren, NP  Vitamin D, Ergocalciferol, (DRISDOL) 1.25 MG (50000 UNIT) CAPS capsule Take 50,000 Units by mouth every 7 (seven) days. Mondays   Yes [provider]    Inpatient Medications: Scheduled Meds:  allopurinol  100 mg Oral BID   apixaban  5 mg Oral BID   ARIPiprazole  5 mg Oral QHS   atorvastatin  40 mg Oral QPM   busPIRone  5 mg Oral TID   ferrous sulfate  325 mg Oral Q breakfast   fluticasone  2 spray Each Nare Daily   insulin aspart  0-5 Units Subcutaneous QHS   insulin aspart  0-9 Units Subcutaneous TID WC   insulin aspart  4 Units Subcutaneous TID WC   insulin glargine  12 Units Subcutaneous Daily   loratadine  10 mg Oral Daily   metoprolol succinate  25 mg Oral Daily   pantoprazole  40 mg Oral Daily   potassium chloride  40 mEq Oral Daily   pregabalin  100 mg Oral TID   silver sulfADIAZINE  1 application Topical Daily   spironolactone  25 mg Oral Daily   temazepam  15 mg Oral QHS   torsemide  20 mg Oral q1800   torsemide  40 mg Oral Daily   traZODone  200 mg Oral QHS   venlafaxine XR  225 mg Oral Q breakfast   Continuous Infusions:  cefTRIAXone (ROCEPHIN)  IV Stopped (02/08/21 1955)   diltiazem  (CARDIZEM) infusion 15 mg/hr (02/09/21 1159)   PRN Meds: acetaminophen **OR** acetaminophen, metoprolol tartrate, morphine injection, ondansetron **OR** ondansetron (ZOFRAN) IV, oxyCODONE  Allergies:    Allergies  Allergen Reactions   Vancomycin Other (See Comments)    "Red Man Syndrome" 02/02/17: possible cause for rash under both arms   Niacin Other (See Comments)   Niacin And Related Other (See Comments)    Red man syndrome   Tubersol [Tuberculin] Other (See Comments)    Reaction unknown   Doxycycline Rash and Other (See Comments)    Social History:   Social History   Socioeconomic History   Marital status: Single    Spouse name: Not on file   Number of children: 1   Years of education: Not on file   Highest education level: Not on file  Occupational History   Occupation: retired  Tobacco Use   Smoking status: Former    Pack years: 0.00    Types: Cigarettes    Quit date: 03/07/2016    Years since quitting: 4.9   Smokeless tobacco: Never  Substance and Sexual Activity   Alcohol use: Not Currently    Comment: rare   Drug use: No   Sexual activity: Not on file  Other Topics Concern   Not on file  Social History Narrative   Not on file   Social Determinants of Health   Financial Resource Strain: Not on file  Food Insecurity: Not on file  Transportation Needs: Not on file  Physical Activity: Not on file  Stress:  Not on file  Social Connections: Not on file  Intimate Partner Violence: Not on file    Family History:    Family History  Problem Relation Age of Onset   CAD Maternal Grandfather    Diabetes Other    Diabetes Mellitus II Neg Hx    Colon cancer Neg Hx    Esophageal cancer Neg Hx    Inflammatory bowel disease Neg Hx    Liver disease Neg Hx    Pancreatic cancer Neg Hx    Rectal cancer Neg Hx    Stomach cancer Neg Hx      ROS:  Constitutional: see HPI  Eyes: Denied vision change or loss Ears/Nose/Mouth/Throat: Denied ear ache, sore throat,  coughing, sinus pain Cardiovascular: see HPI Respiratory: see HPI  Gastrointestinal: Denied nausea, vomiting, abdominal pain, diarrhea Genital/Urinary: Denied dysuria, hematuria, urinary frequency/urgency Musculoskeletal: Denied muscle ache, joint pain Skin: Denied rash, wound Neuro: Denied headache, syncope Psych: history of depression/anxiety  Endocrine: history of diabetes   Physical Exam/Data:   Vitals:   02/09/21 0330 02/09/21 0437 02/09/21 0743 02/09/21 1111  BP:  108/76 (!) 112/59 112/66  Pulse: (!) 109 (!) 102 100 94  Resp: _0 Temp:  97.8 F (36.6 C) 98 F (36.7 C) 97.6 F (36.4 C)  TempSrc:  Oral Oral Oral  SpO2: 97% 93% 94% 95%  Weight:      Height:        Intake/Output Summary (Last 24 hours) at 02/09/2021 1345 Last data filed at 02/09/2021 1112 Gross per 24 hour  Intake 1519.27 ml  Output 1450 ml  Net 69.27 ml   Last 3 Weights 02/08/2021 01/25/2021 01/07/2021  Weight (lbs) 386 lb 0.4 oz 388 lb 389 lb  Weight (kg) 175.1 kg 175.996 kg 176.449 kg  Some encounter information is confidential and restricted. Go to Review Flowsheets activity to see all data.     Body mass index is 49.56 kg/m.   Vitals:  Vitals:   02/09/21 0743 02/09/21 1111  BP: (!) 112/59 112/66  Pulse: 100 94  Resp: 18 18  Temp: 98 F (36.7 C) 97.6 F (36.4 C)  SpO2: 94% 95%   General Appearance: In no apparent distress, laying in bed HEENT: Normocephalic, atraumatic.  Neck: Neck is short and thick, no significant JVD Cardiovascular: Irregularly irregular, normal S1-S2,  no murmur/rub/gallop Respiratory: Resting breathing unlabored, lungs sounds clear to auscultation bilaterally, no use of accessory muscles. On 2 L nasal cannula oxygen with pulse ox 94 to 95% Gastrointestinal: Bowel sounds positive, abdomen soft, non-tender, non-distended.  Extremities: Able to move all extremities in bed without difficulty, BLE with erythema L>R, + tenderness, mild edema  Genitourinary:  genital  exam not performed Musculoskeletal: Normal muscle bulk and tone, global weakness  Skin: Intact, warm, dry. BLE erythema with no open wound  Neurologic: Alert, oriented to person, place and time. Fluent speech,  no cognitive deficit Psychiatric: Normal affect. Mood is appropriate.    EKG:  The EKG was personally reviewed and demonstrates:  A fib with RVR 159 bpm  Telemetry:  Telemetry was personally reviewed and demonstrates: A. fib with ventricular rate of 100  Relevant CV Studies:  Echo from 01/26/21:   1. Limited study for LV function; limited doppler and not all views  obtained.   2. Left ventricular ejection fraction, by estimation, is 70 to 75%. The  left ventricle has hyperdynamic function. The left ventricle has no  regional wall motion abnormalities.   3. Right  ventricular systolic function is normal. The right ventricular  size is normal.   4. The mitral valve is normal in structure. No evidence of mitral  stenosis.   5. The aortic valve has an indeterminant number of cusps. Moderate aortic  valve stenosis.   Myoview 11/26/20:  Nuclear stress EF: 55%. The left ventricular ejection fraction is normal (55-65%). There was no ST segment deviation noted during stress. The study is normal. This is a low risk study.    LHC on 01/17/2018:  There is hyperdynamic left ventricular systolic function. LV end diastolic pressure is normal. The left ventricular ejection fraction is greater than 65% by visual estimate. There is no mitral valve regurgitation. There is no aortic valve stenosis. Prox LAD to Mid LAD lesion is 40% stenosed. Prox Cx to Mid Cx lesion is 20% stenosed. Ost 2nd Mrg lesion is 20% stenosed.   Angiographically minimal coronary disease with ectatic RCA and circumflex. Normal LVEDP and normal LV function/hyperdynamic.     Plan: Return to nursing unit for post catheterization care & TR band removal. Would be okay for discharge from cardiac standpoint after  bedrest post TR band removal.   Defer further plans to primary team and consulting cardiologist.   Laboratory Data:  High Sensitivity Troponin:  No results for input(s): TROPONINIHS in the last 720 hours.   Chemistry Recent Labs  Lab 02/08/21 1536 02/09/21 0244  NA 135 138  K 4.1 3.4*  CL 96* 100  CO2 26 31  GLUCOSE 207* 160*  BUN 21 16  CREATININE 0.93 0.89  CALCIUM 9.5 8.8*  GFRNONAA >60 >60  ANIONGAP 13 7    No results for input(s): PROT, ALBUMIN, AST, ALT, ALKPHOS, BILITOT in the last 168 hours. Hematology Recent Labs  Lab 02/08/21 1536 02/09/21 0244  WBC 12.2* 11.9*  RBC 4.84 4.56  HGB 14.3 13.4  HCT 44.2 41.3  MCV 91.3 90.6  MCH 29.5 29.4  MCHC 32.4 32.4  RDW 15.2 15.3  PLT 320 314   BNPNo results for input(s): BNP, PROBNP in the last 168 hours.  DDimer No results for input(s): DDIMER in the last 168 hours.   Radiology/Studies:  CT Head Wo Contrast  Result Date: 02/08/2021 CLINICAL DATA:  PT BIB EMS from home. Pt has a hx of afib and usually can convert himself out of it. Pts HR is 120-160. Pt is axox4. Pt was put on 2L by EMS. EXAM: CT HEAD WITHOUT CONTRAST TECHNIQUE: Contiguous axial images were obtained from the base of the skull through the vertex without intravenous contrast. COMPARISON:  01/25/2021 FINDINGS: Brain: No evidence of acute infarction, hemorrhage, hydrocephalus, extra-axial collection or mass lesion/mass effect. Mild periventricular white matter hypoattenuation noted consistent with chronic microvascular ischemic change. Vascular: No hyperdense vessel or unexpected calcification. Skull: Normal. Negative for fracture or focal lesion. Sinuses/Orbits: Small amount of dependent fluid in the left sphenoid sinus. Mild scattered areas of ethmoid sinus mucosal thickening. Mild polypoid mucosal thickening at the floor of the right maxillary sinus. Other: None. IMPRESSION: 1. No acute intracranial abnormalities. 2. Mild chronic microvascular ischemic change.  Electronically Signed   By: Lajean Manes M.D.   On: 02/08/2021 13:05   MR BRAIN WO CONTRAST  Result Date: 02/08/2021 CLINICAL DATA:  Left lower extremity weakness EXAM: MRI HEAD WITHOUT CONTRAST TECHNIQUE: Multiplanar, multiecho pulse sequences of the brain and surrounding structures were obtained without intravenous contrast. COMPARISON:  01/25/2021 FINDINGS: Brain: There is no acute infarction or intracranial hemorrhage. There is no intracranial mass,  mass effect, or edema. There is no hydrocephalus or extra-axial fluid collection. Ventricles and sulci are stable in size and configuration. Patchy foci of T2 hyperintensity in the supratentorial white matter are nonspecific but may reflect stable chronic microvascular ischemic changes. Vascular: Major vessel flow voids at the skull base are preserved. Skull and upper cervical spine: Normal marrow signal is preserved. Sinuses/Orbits: Patchy mucosal thickening.  Orbits are unremarkable. Other: Sella is unremarkable.  Mastoid air cells are clear. IMPRESSION: No evidence of recent infarction, hemorrhage, or mass. No significant change since recent prior study. Electronically Signed   By: Macy Mis M.D.   On: 02/08/2021 18:56   MR CERVICAL SPINE WO CONTRAST  Result Date: 02/09/2021 CLINICAL DATA:  Provided history: Spinal stenosis, cervical spine. Additional history provided: Patient with right upper extremity shaking, concern for partial seizure, lethargy, left lower extremity weakness. EXAM: MRI CERVICAL SPINE WITHOUT CONTRAST TECHNIQUE: Multiplanar, multisequence MR imaging of the cervical spine was performed. No intravenous contrast was administered. COMPARISON:  CT angiogram neck 01/25/2021 FINDINGS: The examination is intermittently motion degraded, limiting evaluation. Most notably, there is moderate/severe motion degradation of the axial T2 weighted sequences. Alignment: Mild cervical dextrocurvature. Straightening of the expected cervical lordosis.  Trace C3-C4 and C4-C5 grade 1 anterolisthesis. Trace C5-C6 grade 1 retrolisthesis. Vertebrae: Vertebral body height is maintained. Facet joint ankylosis on the left at C2-C3. Multilevel degenerative endplate irregularity. Mild degenerative endplate edema at X5-M8, C6-C7 and C7-T1. Multilevel ventral osteophytes, most prominent at C5-C6. Cord: Within the limitations of motion degradation, no spinal cord signal abnormality is identified. Posterior Fossa, vertebral arteries, paraspinal tissues: No acute finding within included portions of the posterior fossa. Flow voids preserved within the imaged cervical vertebral arteries. Paraspinal soft tissues unremarkable. Disc levels: Moderate disc degeneration at C5-C6, C6-C7 and C7-T1. Mild disc degeneration at the remaining levels. Borderline congenitally narrow cervical spinal canal. C2-C3: Shallow disc bulge. Left-sided disc osteophyte ridge/uncinate hypertrophy. Facet arthrosis (predominantly on the left). Mild partial effacement of the ventral thecal sac without significant spinal cord mass effect. Severe left neural foraminal narrowing. C3-C4: Trace grade 1 anterolisthesis. Disc uncovering with disc bulge and bilateral disc osteophyte ridge/uncinate hypertrophy. Facet arthrosis. Mild ligamentum flavum hypertrophy. Mild relative spinal canal narrowing with likely contact upon the ventral spinal cord. Severe bilateral neural foraminal narrowing. C4-C5: Trace grade 1 anterolisthesis. Disc uncovering with disc bulge and bilateral uncovertebral hypertrophy. Moderate/advanced facet arthrosis. Mild ligamentum flavum hypertrophy. Mild spinal canal stenosis with likely contact upon the ventral spinal cord. Motion degradation limits evaluation of the neural foramina. Bilateral neural foraminal narrowing (moderate/severe right, moderate left). C5-C6: Trace grade 1 retrolisthesis. Posterior disc osteophyte complex with bilateral disc osteophyte ridge/uncinate hypertrophy. Facet  arthrosis. Minimal ligamentum flavum hypertrophy. Mild spinal canal stenosis with contact upon the ventral spinal cord. Severe bilateral neural foraminal narrowing. C6-C7: Posterior disc osteophyte complex with bilateral disc osteophyte ridge/uncinate hypertrophy. Facet arthrosis (greater on the left). Mild relative spinal canal narrowing without significant spinal cord mass effect. Severe bilateral neural foraminal narrowing. C7-T1: Shallow disc bulge with mild endplate spurring/osteophytic ridging. No significant spinal canal stenosis. Mild relative right neural foraminal narrowing. IMPRESSION: Motion degraded examination, as described and limiting evaluation. Cervical spondylosis, as outlined. No more than mild spinal canal stenosis. Multilevel neural foraminal narrowing, as detailed and greatest on the left at C2-C3 (severe), bilaterally at C3-C4 (severe), on the right at C4-C5 (moderate/severe), bilaterally at C5-C6 (severe) and bilaterally at C6-C7 (severe). Additional sites of mild and moderate foraminal stenosis, as above. Mild degenerative  endplate edema at F1-M3, C6-C7 and C7-T1. Mild cervical dextrocurvature. Straightening of the expected cervical lordosis. Trace grade 1 spondylolisthesis at C3-C4, C4-C5 and C5-C6. Electronically Signed   By: Kellie Simmering DO   On: 02/09/2021 12:58   DG CHEST PORT 1 VIEW  Result Date: 02/08/2021 CLINICAL DATA:  Afib with RVR. Hx of DM, asthma, COPD, CHF, HTN. Pt is a former smoker. EXAM: PORTABLE CHEST 1 VIEW COMPARISON:  Chest x-ray 01/25/2021, CT chest 09/09/2019 FINDINGS: The heart size and mediastinal contours are unchanged. Bibasilar streaky airspace opacities likely representing atelectasis. No focal consolidation. No pulmonary edema. No pleural effusion. No pneumothorax. No acute osseous abnormality. Query old healed posterior 6 rib fracture. IMPRESSION: No active disease. Electronically Signed   By: Iven Finn M.D.   On: 02/08/2021 20:03     Assessment  and Plan:   Paroxysmal A fib with RVR History of SVT -Presented with generalized weakness and fatigue -EKG admission revealed A. fib RVR 159 bpm -Last echo from 6/21 with EF 70 to 75%, no RWMA, moderate AS -will check TSH, magnesium, replete for hypokalemia, possible exacerbated by cellulitis  -Currently on diltiazem drip, please wean off when HR sustained  <100 - will increase metoprolol XL from 64m to 525mtoday  - Resume nightly BIPAP for OSA - Continue anticoagulation with Eliquis for CHADSVAS score of 4  Chronic diastolic heart failure -Clinically euvolemic -Chest x-ray without acute finding -Continue home regimen GDMT with torsemide, metoprolol, spironolactone and farxiga   Nonobstructive CAD -Denied chest pain -Continue medical therapy with metoprolol, no ASA due to Eliquis use, statin; hold Imdur to allow BP room for cardizem gtt   Moderate aortic stenosis -Outpatient follow-up with cardiology for surveillance  Hypertension -Blood pressure controlled, continue torsemide, metoprolol, spironolactone  Hyperlipidemia -Continue statin  Bilateral lower extremity cellulitis -Managed per IM  Left-sided weakness -Negative for acute CVA, neurology consulted, pending cervical spine lesion work-up  OSA Type 2 diabetes Anxiety Gout Seizure  -Managed per IM   Risk Assessment/Risk Scores:      New York Heart Association (NYHA) Functional Class NYHA Class II  CHA2DS2-VASc Score = 4  This indicates a 4.8% annual risk of stroke. The patient's score is based upon: CHF History: Yes HTN History: Yes Diabetes History: Yes Stroke History: No Vascular Disease History: Yes Age Score: 0 Gender Score: 0       For questions or updates, please contact [facility] consult [email or web address] for contact info under    Signed, XiMargie BilletNP  02/09/2021 1:45 PM   I have personally seen and examined this patient. I agree with the assessment and plan as outlined  above.  He is known to have PAF. He is on Eliquis. Now admitted with atrial fib with RVR. Rate is controlled on IV Cardizem with current rates 90-100 bpm.  No evidence of volume overload on exam.   My exam:  General: Obese male in NAD HEENT: OP clear, mucus membranes moist  SKIN: LE warm and red Neuro: No focal deficits  Musculoskeletal: Muscle strength 5/5 all ext  Psychiatric: Mood and affect normal  Neck: No JVD Lungs:Clear bilaterally, no wheezes, rhonci, crackles Cardiovascular: Irreg irreg. No murmurs, gallops or rubs. Abdomen:Soft. Bowel sounds present. Non-tender.  Extremities: No lower extremity edema.   Plan:  Atrial fib with RVR: Rate no controlled on Cardizem drip.          -Increase Toprol to 50 mg daily         -Continue Cardizem  drip. Hopefully he converts to sinus overnight         -Continue Eliquis  Lauree Chandler 02/09/2021 2:19 PM

## 2021-02-09 NOTE — Progress Notes (Signed)
Pt placed on Auto CPAP (min 6-max 20) with XL full face mask, and 2L O2 bled in. Advised pt to notify for RT if any changes needed to be made to CPAP settings. RT will continue to monitor.

## 2021-02-09 NOTE — Progress Notes (Signed)
   02/08/21 2015  Assess: MEWS Score  Temp 98.3 F (36.8 C)  BP 110/83  Pulse Rate (!) 127  ECG Heart Rate (!) 127  Resp 20  Level of Consciousness Alert  SpO2 98 %  O2 Device Nasal Cannula  O2 Flow Rate (L/min) 2 L/min  Assess: MEWS Score  MEWS Temp 0  MEWS Systolic 0  MEWS Pulse 2  MEWS RR 0  MEWS LOC 0  MEWS Score 2  MEWS Score Color Yellow  Assess: if the MEWS score is Yellow or Red  Were vital signs taken at a resting state? Yes  Focused Assessment Change from prior assessment (see assessment flowsheet)  Early Detection of Sepsis Score *See Row Information* Low  MEWS guidelines implemented *See Row Information* Yes  Treat  MEWS Interventions Administered scheduled meds/treatments  Take Vital Signs  Increase Vital Sign Frequency  Yellow: Q 2hr X 2 then Q 4hr X 2, if remains yellow, continue Q 4hrs  Escalate  MEWS: Escalate Yellow: discuss with charge nurse/RN and consider discussing with provider and RRT  Notify: Charge Nurse/RN  Name of Charge Nurse/RN Notified Deborah, RN  Date Charge Nurse/RN Notified 02/08/21  Time Charge Nurse/RN Notified 2030  Document  Patient Outcome Stabilized after interventions  Progress note created (see row info) Yes

## 2021-02-09 NOTE — Progress Notes (Signed)
Neurology Progress Note  S: Patient known to NP from his last hospitalization in March. He states he is feeling well and hopes to go home soon. He denies any numbness or tingling except for his feet which is chronic due to neuropathy. He is having no pain to spine or radiating to UEs. He denies clumsiness with UEs and has not had any tremoring/seizure like activity to his right hand as he did in March. He denies saddle anesthesia or incontinence. NP noted AF on tele. Patient states cardiology is considering "shock".   O: Current vital signs: BP (!) 112/59 (BP Location: Left Arm)   Pulse 100   Temp 98 F (36.7 C) (Oral)   Resp 18   Ht 6\' 2"  (1.88 m)   Wt (!) 175.1 kg   SpO2 94%   BMI 49.56 kg/m  Vital signs in last 24 hours: Temp:  [97.8 F (36.6 C)-98.3 F (36.8 C)] 98 F (36.7 C) (07/05 0743) Pulse Rate:  [41-154] 100 (07/05 0743) Resp:  [13-21] 18 (07/05 0743) BP: (90-129)/(50-96) 112/59 (07/05 0743) SpO2:  [92 %-98 %] 94 % (07/05 0743) Weight:  [175.1 kg] 175.1 kg (07/04 2015)  GENERAL: Well appearing, morbidly obese male. Awake, alert in NAD. HEENT: Normocephalic and atraumatic. LUNGS: Normal respiratory effort.  CV: AF on tele with HR over 100 at times.  Ext: warm. Pinkish/brown discoloration to LEs below knees.   NEURO:  Mental Status: Awake and alert. Orientation x 3.   Speech/Language: speech is without aphasia or dysarthria.  Naming, repetition, fluency, and comprehension intact.  Cranial Nerves:  II: PERRL. Visual fields full.  III, IV, VI: EOMI. Eyelids elevate symmetrically.  V: Sensation is intact to light touch and symmetrical to face.  VII: Smile is symmetrical. NP does not appreciate droop.  VIII: hearing intact to voice. IX, X: Palate elevates symmetrically. Phonation is normal.  XI: Shoulder shrug 5/5. XII: tongue is midline without fasciculations. Motor:  5/5 strength to all muscle groups tested in UEs. He is able to raise his LLE off bed about 5  inches. Unable to raise LLE above tibia. Unable to raise RLE at all. But he has 5/5 strength to knee bend, dorsiflexion, and plantar flexion BLEs.  Tone: is normal and bulk is increased.  Sensation- Decreased to light touch in R hand. Decreased to light touch in L arm above wrist. Light touch sensation decreased to BLEs below ankles. Vibratory sense intact. Extinction absent to light touch to DSS.    Coordination: FTN intact bilaterally. Unable to perform HKS.   DTRs: 2+ throughout.  Gait deferred.  Medications Current Facility-Administered Medications:    acetaminophen (TYLENOL) tablet 650 mg, 650 mg, Oral, Q6H PRN, 650 mg at 02/09/21 0015 **OR** acetaminophen (TYLENOL) suppository 650 mg, 650 mg, Rectal, Q6H PRN, Tamala Julian, Rondell A, MD   allopurinol (ZYLOPRIM) tablet 100 mg, 100 mg, Oral, BID, Smith, Rondell A, MD, 100 mg at 02/08/21 2147   apixaban (ELIQUIS) tablet 5 mg, 5 mg, Oral, BID, Smith, Rondell A, MD, 5 mg at 02/08/21 2147   ARIPiprazole (ABILIFY) tablet 5 mg, 5 mg, Oral, QHS, Smith, Rondell A, MD, 5 mg at 02/08/21 2147   atorvastatin (LIPITOR) tablet 40 mg, 40 mg, Oral, QPM, Smith, Rondell A, MD, 40 mg at 02/08/21 2147   busPIRone (BUSPAR) tablet 5 mg, 5 mg, Oral, TID, Smith, Rondell A, MD, 5 mg at 02/08/21 2147   cefTRIAXone (ROCEPHIN) 2 g in sodium chloride 0.9 % 100 mL IVPB, 2 g, Intravenous, Q24H,  Norval Morton, MD, Stopped at 02/08/21 1955   diltiazem (CARDIZEM) 125 mg in dextrose 5% 125 mL (1 mg/mL) infusion, 5-15 mg/hr, Intravenous, Continuous, Tamala Julian, Rondell A, MD, Last Rate: 15 mL/hr at 02/09/21 0610, 15 mg/hr at 02/09/21 0610   ferrous sulfate tablet 325 mg, 325 mg, Oral, Q breakfast, Smith, Rondell A, MD   fluticasone (FLONASE) 50 MCG/ACT nasal spray 2 spray, 2 spray, Each Nare, Daily, Smith, Rondell A, MD   insulin glargine (LANTUS) injection 12 Units, 12 Units, Subcutaneous, Daily, Smith, Rondell A, MD   loratadine (CLARITIN) tablet 10 mg, 10 mg, Oral, Daily, Tamala Julian,  Rondell A, MD, 10 mg at 02/08/21 2146   LORazepam (ATIVAN) injection 0.25-0.5 mg, 0.25-0.5 mg, Intravenous, Once PRN, Fuller Plan A, MD   metoprolol succinate (TOPROL-XL) 24 hr tablet 25 mg, 25 mg, Oral, Daily, Smith, Rondell A, MD   metoprolol tartrate (LOPRESSOR) injection 2.5 mg, 2.5 mg, Intravenous, Q5 min PRN, Hall, Carole N, DO, 2.5 mg at 02/09/21 0113   ondansetron (ZOFRAN) tablet 4 mg, 4 mg, Oral, Q6H PRN **OR** ondansetron (ZOFRAN) injection 4 mg, 4 mg, Intravenous, Q6H PRN, Tamala Julian, Rondell A, MD   oxyCODONE (Oxy IR/ROXICODONE) immediate release tablet 5 mg, 5 mg, Oral, Q8H PRN, Tamala Julian, Rondell A, MD, 5 mg at 02/08/21 2147   pantoprazole (PROTONIX) EC tablet 40 mg, 40 mg, Oral, Daily, Smith, Rondell A, MD   potassium chloride SA (KLOR-CON) CR tablet 40 mEq, 40 mEq, Oral, Daily, Smith, Rondell A, MD   pregabalin (LYRICA) capsule 100 mg, 100 mg, Oral, TID, Smith, Rondell A, MD, 100 mg at 02/08/21 2147   silver sulfADIAZINE (SILVADENE) 1 % cream 1 application, 1 application, Topical, Daily, Tamala Julian, Rondell A, MD, 1 application at 93/79/02 0116   temazepam (RESTORIL) capsule 15 mg, 15 mg, Oral, QHS, Smith, Rondell A, MD, 15 mg at 02/08/21 2146   torsemide (DEMADEX) tablet 20 mg, 20 mg, Oral, q1800, Smith, Rondell A, MD, 20 mg at 02/08/21 2146   torsemide (DEMADEX) tablet 40 mg, 40 mg, Oral, Daily, Smith, Rondell A, MD   traZODone (DESYREL) tablet 200 mg, 200 mg, Oral, QHS, Smith, Rondell A, MD, 200 mg at 02/08/21 2147   venlafaxine XR (EFFEXOR-XR) 24 hr capsule 225 mg, 225 mg, Oral, Q breakfast, Smith, Rondell A, MD  Imaging  MRI Brain  No evidence of recent infarction, hemorrhage, or mass. No significant change since recent prior study.   Assessment: 65 yo male who was admitted yesterday for LLE weakness and lethargy. Lethargy is attributed to AF. His left hand grip weakness is resolved on exam today. His right over left hand decreased sensation could be related to his history of focal  seizures. MRI C spine has been ordered to assess for cervical lesion because of his weakness to left hand found yesterday on consult. MRI brain ruled out stroke. His LE weakness is chronic and likely due to his lower back problems for which he sees a pain management MD. Patient states he has been discussing spinal stimulator.  His weakness is also likely due to body habitus.   Impression: -left grip weakness, which is not present on exam today.  -history of partial seizures with no further events.   Recommendations/Plan:  -Awaiting C Spine MRI. -Continue Lyrica 100mg  po tid.  -Outpatient EMG/NCS.  -PT/OT.   Pt seen by Clance Boll, MSN, APN-BC/Nurse Practitioner/Neuro and later by MD. Note and plan to be edited as needed by MD.  Pager: [phone]

## 2021-02-09 NOTE — Evaluation (Signed)
Physical Therapy Evaluation Patient Details Name: Lawrence [name] MRN: [id number] DOB: 1956-07-10 Today's Date: 02/09/2021   History of Present Illness  Lawrence [name] is a 65 y.o. male with medical history significant of hypertension, hyperlipidemia, DM type II, COPD,  paroxysmal atrial fibrillation on Eliquis, diastolic CHF( last EF 55-97%), moderate aortic stenosis, seizure disorder, OSA on BiPAP, gout, and depression presents with complaints of malaise which started yesterday.  He normally feels like this when he goes into atrial fibrillation.  MRI  negative for stroke. neurology Consulted:Suspected cerebral infarction due to occlusion of stenosis of small artery. MRI cervical spine is pending.. Patient  was recently [place] from hospital 01/26/21.:  Clinical Impression  Patient mobilized to sitting and supine  with  light min assistance.. Patient declined to stand  as  bari Rw not available. Patient does present with mild motor deficits LLE, decreased coordination noted with Dorsiflexion and  knee extension, heel shin.   Patient  strongly stated plans to return home.  Pt admitted with above diagnosis.  Pt currently with functional limitations due to the deficits listed below (see PT Problem List). Pt will benefit from skilled PT to increase their independence and safety with mobility to allow discharge to the venue listed below.   Patient was [place] 6/21 and ambulated x 16'.    Follow Up Recommendations Home health PT;Supervision/Assistance - 24 hour zPer patient strong desire.    Equipment Recommendations  None recommended by PT    Recommendations for Other Services       Precautions / Restrictions Precautions Precautions: Fall      Mobility  Bed Mobility Overal bed mobility: Needs Assistance Bed Mobility: Supine to Sit;Sit to Supine     Supine to sit: HOB elevated;Min assist     General bed mobility comments: HOB elevated; increased time/effort, momentum to rise, 1 hand hod assist  to sit upright , able to scoot to bed edge and to scoot along bed towards [facility] prior to return to supine. No assistance to retun to supine.    Transfers                 General transfer comment: NT, patient did not want to try with regular RW and bari Rw not available.  Ambulation/Gait                Stairs            Wheelchair Mobility    Modified Rankin (Stroke Patients Only)       Balance Overall balance assessment: Needs assistance Sitting-balance support: Feet supported Sitting balance-Leahy Scale: Good                                       Pertinent Vitals/Pain Faces Pain Scale: Hurts little more Pain Location: generalized Pain Intervention(s): Monitored during session    Home Living Family/patient expects to be discharged to:: Private residence Living Arrangements: Spouse/significant other Available Help at Discharge: Family;Available 24 hours/day Type of Home: Apartment Home Access: Level entry     Home Layout: One level Home Equipment: Walker - 4 wheels;Electric scooter;Hospital bed;Adaptive equipment Additional Comments: has shower seat and bsc but they won't fit in bathroom    Prior Function     Gait / Transfers Assistance Needed: walks  40' max distance with rollator, uses scooter out, Uses transsportation   services frequently" I cannot get in a car".  Comments: does not drive, spouse not driving as well     Hand Dominance   Dominant Hand: Right    Extremity/Trunk Assessment   Upper Extremity Assessment RUE Deficits / Details: grosslty WFl strength    Lower Extremity Assessment Lower Extremity Assessment: LLE deficits/detail LLE Deficits / Details: demonstrates mild  decreased coordination with heel shin and reciprocal dorsiflexion on left.    Cervical / Trunk Assessment Cervical / Trunk Exceptions: Body habitus  Communication   Communication: No difficulties  Cognition Arousal/Alertness:  Awake/alert Behavior During Therapy: WFL for tasks assessed/performed Overall Cognitive Status: Within Functional Limits for tasks assessed                                        General Comments      Exercises     Assessment/Plan    PT Assessment Patient needs continued PT services  PT Problem List Decreased strength;Decreased range of motion;Decreased activity tolerance;Decreased balance;Decreased mobility;Decreased coordination;Decreased knowledge of use of DME;Cardiopulmonary status limiting activity;Obesity;Pain       PT Treatment Interventions DME instruction;Gait training;Functional mobility training;Therapeutic activities;Therapeutic exercise;Patient/family education    PT Goals (Current goals can be found in the Care Plan section)  Acute Rehab PT Goals Patient Stated Goal: To return home. PT Goal Formulation: With patient Time For Goal Achievement: 02/23/21 Potential to Achieve Goals: Fair    Frequency Min 3X/week   Barriers to discharge        Co-evaluation               AM-PAC PT "6 Clicks" Mobility  Outcome Measure Help needed turning from your back to your side while in a flat bed without using bedrails?: A Little Help needed moving from lying on your back to sitting on the side of a flat bed without using bedrails?: A Little Help needed moving to and from a bed to a chair (including a wheelchair)?: A Lot Help needed standing up from a chair using your arms (e.g., wheelchair or bedside chair)?: A Lot Help needed to walk in hospital room?: Total Help needed climbing 3-5 steps with a railing? : Total 6 Click Score: 12    End of Session   Activity Tolerance: Patient limited by fatigue Patient left: in bed;with call bell/phone within reach;with bed alarm set Nurse Communication: Mobility status PT Visit Diagnosis: Unsteadiness on feet (R26.81);Muscle weakness (generalized) (M62.81);Difficulty in walking, not elsewhere classified  (R26.2);Pain    Time: 0940-1003 PT Time Calculation (min) (ACUTE ONLY): 23 min   Charges:   PT Evaluation $PT Eval Low Complexity: 1 Low PT Treatments $Therapeutic Activity: 8-22 mins        Tresa Endo PT Acute Rehabilitation Services  Office [phone]   Claretha Cooper 02/09/2021, 12:28 PM

## 2021-02-09 NOTE — Progress Notes (Signed)
Pt informed this RN that he is interested in changing his code status. Chaplain consult order placed.   Elaina Hoops, RN

## 2021-02-09 NOTE — Progress Notes (Signed)
PROGRESS NOTE    Lawrence [name]  OAC:[id number] DOB: 1956-06-10 DOA: 02/08/2021 PCP: [name], PA-C    Chief Complaint  Patient presents with   Atrial Fibrillation    Brief Narrative:   Lawrence [name] is a 65 y.o. male with medical history significant of hypertension, hyperlipidemia, DM type II, COPD,  paroxysmal atrial fibrillation on Eliquis, diastolic CHF( last EF 01-09%), moderate aortic stenosis, seizure disorder, OSA on BiPAP, gout, and depression presents with complaints of malaise. Patient had just recently been hospitalized from 6/20-6/21 with transient episode of left-sided weakness which MRI of the brain was negative for any acute abnormality.  Symptoms were thought possibly related to seizure-like activity, but EEG was negative.  Patient was recommended to continue on Lyrica 100 mg 3 times daily. He was also found to have bilateral lower extremity erythema and tenderness consistent with cellulitis.   Assessment & Plan:   Active Problems:   OSA (obstructive sleep apnea)   Morbid obesity (HCC)   Essential hypertension, benign   Chronic diastolic CHF (congestive heart failure) (HCC)   Leukocytosis   Bilateral lower leg cellulitis   Do not resuscitate status   Type 2 diabetes mellitus with diabetic polyneuropathy, with long-term current use of insulin (HCC)   COPD (chronic obstructive pulmonary disease) (HCC)   Atrial fibrillation with rapid ventricular response (HCC)   Left-sided weakness   Recurrent left-sided weakness Patient was recently admitted and discharged 2 weeks ago for similar complaints.  MRI of the brain was negative for any acute stroke. Neurology consulted and he is scheduled for MRI of the cervical spine for further evaluation Therapy evaluations ordered   Cellulitis of the lower extremities Patient was found to have erythema, tenderness and swelling of the lower extremities worsening over the last few days Patient was started on IV  Rocephin. Follow cultures.    Atrial fibrillation with RVR  Rate is poorly controlled currently Cardizem gtt. and metoprolol Patient on Eliquis for anticoagulation. Cardiology on board.    Type 2 diabetes mellitus Insulin-dependent. CBG (last 3)  Recent Labs    02/09/21 0742 02/09/21 1108  GLUCAP 237* 270*   Resume SSI.  ADD NOVOLOG 4 UNITS tidac.  Last hemoglobin A1c is 8   Chronic diastolic heart failure Patient on torsemide continue the same continue with strict intake and output and daily weights, Patient is on 2 L of nasal cannula oxygen, Try to wean him off oxygen.    Seizure disorder Resume home medications   Essential hypertension Blood pressure parameters are well controlled this morning    COPD No wheezing heard on exam Continue with duo nebs and as needed   Depression and anxiety Continue with Abilify and Effexor.   Obstructive sleep apnea Continue with BiPAP   Morbid obesity Body mass index is 49.56 kg/m.     DVT prophylaxis: Eliquis.  Code Status: DNR Family Communication: (FAMILY AT BEDSIDE) Disposition:   Status is: Inpatient  Remains inpatient appropriate because:Ongoing diagnostic testing needed not appropriate for outpatient work up, Unsafe d/c plan, and IV treatments appropriate due to intensity of illness or inability to take PO  Dispo: The patient is from: Home              Anticipated d/c is to:  pending              Patient currently is not medically stable to d/c.   Difficult to place patient No       Consultants:  Cardiology Neurology  Procedures: MRI cervical spine.  Antimicrobials:  Antibiotics Given (last 72 hours)     Date/Time Action Medication Dose Rate   02/08/21 1925 New Bag/Given   cefTRIAXone (ROCEPHIN) 2 g in sodium chloride 0.9 % 100 mL IVPB 2 g 200 mL/hr         Subjective: Reports worsening pain in the legs.   Objective: Vitals:   02/09/21 0330 02/09/21 0437 02/09/21 0743  02/09/21 1111  BP:  108/76 (!) 112/59 112/66  Pulse: (!) 109 (!) 102 100 94  Resp: 20 18 18 18   Temp:  97.8 F (36.6 C) 98 F (36.7 C) 97.6 F (36.4 C)  TempSrc:  Oral Oral Oral  SpO2: 97% 93% 94% 95%  Weight:      Height:        Intake/Output Summary (Last 24 hours) at 02/09/2021 1212 Last data filed at 02/09/2021 1112 Gross per 24 hour  Intake 1519.27 ml  Output 1450 ml  Net 69.27 ml   Filed Weights   02/08/21 2015  Weight: (!) 175.1 kg    Examination:  General exam: Appears calm and comfortable  Respiratory system: Clear to auscultation. Respiratory effort normal. Cardiovascular system: S1 & S2 heard, irregularly irregular, 2+ leg edema.  Gastrointestinal system: Abdomen is nondistended, soft and nontender.. Normal bowel sounds heard. Central nervous system: Alert and oriented. No focal neurological deficits. Extremities: bilateral lower extremity 2+ edema with erythema and tenderness.  Skin: No rashes, lesions or ulcers Psychiatry: Mood & affect appropriate.     Data Reviewed: I have personally reviewed following labs and imaging studies  CBC: Recent Labs  Lab 02/08/21 1536 02/09/21 0244  WBC 12.2* 11.9*  HGB 14.3 13.4  HCT 44.2 41.3  MCV 91.3 90.6  PLT 320 161    Basic Metabolic Panel: Recent Labs  Lab 02/08/21 1536 02/09/21 0244  NA 135 138  K 4.1 3.4*  CL 96* 100  CO2 26 31  GLUCOSE 207* 160*  BUN 21 16  CREATININE 0.93 0.89  CALCIUM 9.5 8.8*  MG 2.1  --     GFR: Estimated Creatinine Clearance: 143.5 mL/min (by C-G formula based on SCr of 0.89 mg/dL).  Liver Function Tests: No results for input(s): AST, ALT, ALKPHOS, BILITOT, PROT, ALBUMIN in the last 168 hours.  CBG: Recent Labs  Lab 02/09/21 0742 02/09/21 1108  GLUCAP 237* 270*     Recent Results (from the past 240 hour(s))  SARS CORONAVIRUS 2 (TAT 6-24 HRS) Nasopharyngeal Nasopharyngeal Swab     Status: None   Collection Time: 02/08/21  4:07 PM   Specimen: Nasopharyngeal  Swab  Result Value Ref Range Status   SARS Coronavirus 2 NEGATIVE NEGATIVE Final    Comment: (NOTE) SARS-CoV-2 target nucleic acids are NOT DETECTED.  The SARS-CoV-2 RNA is generally detectable in upper and lower respiratory specimens during the acute phase of infection. Negative results do not preclude SARS-CoV-2 infection, do not rule out co-infections with other pathogens, and should not be used as the sole basis for treatment or other patient management decisions. Negative results must be combined with clinical observations, patient history, and epidemiological information. The expected result is Negative.  Fact Sheet for Patients: SugarRoll.be  Fact Sheet for Healthcare Providers: https://www.woods-mathews.com/  This test is not yet approved or cleared by the [place] FDA and  has been authorized for detection and/or diagnosis of SARS-CoV-2 by FDA under an Emergency Use Authorization (EUA). This EUA will remain  in effect (meaning this test can be used) for  the duration of the COVID-19 declaration under Se ction 564(b)(1) of the Act, 21 U.S.C. section 360bbb-3(b)(1), unless the authorization is terminated or revoked sooner.  Performed at [facility], [place] [address], [place], [place] [address]          Radiology Studies: CT Head Wo Contrast  Result Date: 02/08/2021 CLINICAL DATA:  PT BIB EMS from home. Pt has a hx of afib and usually can convert himself out of it. Pts HR is 120-160. Pt is axox4. Pt was put on 2L by EMS. EXAM: CT HEAD WITHOUT CONTRAST TECHNIQUE: Contiguous axial images were obtained from the base of the skull through the vertex without intravenous contrast. COMPARISON:  01/25/2021 FINDINGS: Brain: No evidence of acute infarction, hemorrhage, hydrocephalus, extra-axial collection or mass lesion/mass effect. Mild periventricular white matter hypoattenuation noted consistent with chronic microvascular  ischemic change. Vascular: No hyperdense vessel or unexpected calcification. Skull: Normal. Negative for fracture or focal lesion. Sinuses/Orbits: Small amount of dependent fluid in the left sphenoid sinus. Mild scattered areas of ethmoid sinus mucosal thickening. Mild polypoid mucosal thickening at the floor of the right maxillary sinus. Other: None. IMPRESSION: 1. No acute intracranial abnormalities. 2. Mild chronic microvascular ischemic change. Electronically Signed   By: Lajean Manes M.D.   On: 02/08/2021 13:05   MR BRAIN WO CONTRAST  Result Date: 02/08/2021 CLINICAL DATA:  Left lower extremity weakness EXAM: MRI HEAD WITHOUT CONTRAST TECHNIQUE: Multiplanar, multiecho pulse sequences of the brain and surrounding structures were obtained without intravenous contrast. COMPARISON:  01/25/2021 FINDINGS: Brain: There is no acute infarction or intracranial hemorrhage. There is no intracranial mass, mass effect, or edema. There is no hydrocephalus or extra-axial fluid collection. Ventricles and sulci are stable in size and configuration. Patchy foci of T2 hyperintensity in the supratentorial white matter are nonspecific but may reflect stable chronic microvascular ischemic changes. Vascular: Major vessel flow voids at the skull base are preserved. Skull and upper cervical spine: Normal marrow signal is preserved. Sinuses/Orbits: Patchy mucosal thickening.  Orbits are unremarkable. Other: Sella is unremarkable.  Mastoid air cells are clear. IMPRESSION: No evidence of recent infarction, hemorrhage, or mass. No significant change since recent prior study. Electronically Signed   By: Macy Mis M.D.   On: 02/08/2021 18:56   DG CHEST PORT 1 VIEW  Result Date: 02/08/2021 CLINICAL DATA:  Afib with RVR. Hx of DM, asthma, COPD, CHF, HTN. Pt is a former smoker. EXAM: PORTABLE CHEST 1 VIEW COMPARISON:  Chest x-ray 01/25/2021, CT chest 09/09/2019 FINDINGS: The heart size and mediastinal contours are unchanged.  Bibasilar streaky airspace opacities likely representing atelectasis. No focal consolidation. No pulmonary edema. No pleural effusion. No pneumothorax. No acute osseous abnormality. Query old healed posterior 6 rib fracture. IMPRESSION: No active disease. Electronically Signed   By: Iven Finn M.D.   On: 02/08/2021 20:03        Scheduled Meds:  allopurinol  100 mg Oral BID   apixaban  5 mg Oral BID   ARIPiprazole  5 mg Oral QHS   atorvastatin  40 mg Oral QPM   busPIRone  5 mg Oral TID   ferrous sulfate  325 mg Oral Q breakfast   fluticasone  2 spray Each Nare Daily   insulin aspart  0-5 Units Subcutaneous QHS   insulin aspart  0-9 Units Subcutaneous TID WC   insulin glargine  12 Units Subcutaneous Daily   loratadine  10 mg Oral Daily   metoprolol succinate  25 mg Oral Daily  pantoprazole  40 mg Oral Daily   potassium chloride  40 mEq Oral Daily   pregabalin  100 mg Oral TID   silver sulfADIAZINE  1 application Topical Daily   temazepam  15 mg Oral QHS   torsemide  20 mg Oral q1800   torsemide  40 mg Oral Daily   traZODone  200 mg Oral QHS   venlafaxine XR  225 mg Oral Q breakfast   Continuous Infusions:  cefTRIAXone (ROCEPHIN)  IV Stopped (02/08/21 1955)   diltiazem (CARDIZEM) infusion 15 mg/hr (02/09/21 0900)     LOS: 0 days      Hosie Poisson, MD [facility]   To contact the attending provider between 7A-7P or the covering provider during after hours 7P-7A, please log into the web site [email or web address] and access using universal [place] password for that web site. If you do not have the password, please call the hospital operator.  02/09/2021, 12:12 PM

## 2021-02-09 NOTE — Progress Notes (Signed)
   02/09/21 1045  Clinical Encounter Type  Visited With Patient and family together  Visit Type Initial  Referral From Nurse  Consult/Referral To Chaplain   Chaplain responded to consult for Advance Directive. Chaplain provided AD education to Pt and two family members present. Pt wants to change his existing AD by adding a friend as his back-up health care agent. Chaplain answered Pt's and Pt's family's questions. Pt mentioned not wanting his sister [place] to be able to call and get updates. Chaplain let Pt know that chaplain is unable to add that to his chart and he will need to discuss with medical team. Pt knows to reach out when ready for notarization and was given chaplain office number for any further questions. Chaplain engaged active listening and provided emotional and spiritual support. Chaplain remains available.   This note was prepared by Chaplain Resident, Dante Gang, MDiv. Chaplain remains available as needed through the on-call pager: [phone].

## 2021-02-10 LAB — CBC WITH DIFFERENTIAL/PLATELET
Abs Immature Granulocytes: 0.05 10*3/uL (ref 0.00–0.07)
Basophils Absolute: 0.1 10*3/uL (ref 0.0–0.1)
Basophils Relative: 1 %
Eosinophils Absolute: 0.5 10*3/uL (ref 0.0–0.5)
Eosinophils Relative: 5 %
HCT: 43.3 % (ref 39.0–52.0)
Hemoglobin: 14.1 g/dL (ref 13.0–17.0)
Immature Granulocytes: 1 %
Lymphocytes Relative: 13 %
Lymphs Abs: 1.4 10*3/uL (ref 0.7–4.0)
MCH: 29.4 pg (ref 26.0–34.0)
MCHC: 32.6 g/dL (ref 30.0–36.0)
MCV: 90.4 fL (ref 80.0–100.0)
Monocytes Absolute: 0.8 10*3/uL (ref 0.1–1.0)
Monocytes Relative: 7 %
Neutro Abs: 7.8 10*3/uL — ABNORMAL HIGH (ref 1.7–7.7)
Neutrophils Relative %: 73 %
Platelets: 304 10*3/uL (ref 150–400)
RBC: 4.79 MIL/uL (ref 4.22–5.81)
RDW: 15.4 % (ref 11.5–15.5)
WBC: 10.5 10*3/uL (ref 4.0–10.5)
nRBC: 0 % (ref 0.0–0.2)

## 2021-02-10 LAB — BASIC METABOLIC PANEL
Anion gap: 9 (ref 5–15)
BUN: 16 mg/dL (ref 8–23)
CO2: 27 mmol/L (ref 22–32)
Calcium: 9 mg/dL (ref 8.9–10.3)
Chloride: 99 mmol/L (ref 98–111)
Creatinine, Ser: 1.03 mg/dL (ref 0.61–1.24)
GFR, Estimated: >60 mL/min (ref >60)
Glucose, Bld: 175 mg/dL — ABNORMAL HIGH (ref 70–99)
Potassium: 3.6 mmol/L (ref 3.5–5.1)
Sodium: 135 mmol/L (ref 135–145)

## 2021-02-10 LAB — GLUCOSE, CAPILLARY: Glucose-Capillary: 177 mg/dL — ABNORMAL HIGH (ref 70–99)

## 2021-02-10 MED ORDER — DILTIAZEM HCL 60 MG PO TABS
30.0000 mg | ORAL_TABLET | Freq: Four times a day (QID) | ORAL | Status: DC
  Administered 2021-02-10 – 2021-02-11 (×4): 30 mg via ORAL
  Filled 2021-02-10 (×4): qty 1

## 2021-02-10 NOTE — Progress Notes (Signed)
His MRI C-spine does not show significant stenosis to account for his symptoms.  He continues to have mild left-sided weakness in the femoral distribution, and I wonder if this represents intermittent femoral neuropraxia.  As outlined previously, I think the neck step would really be working with physical therapy and getting an outpatient EMG.  I do not have any further inpatient work-up at this time.  Neurology will be available on an as-needed basis moving forward, please call with further questions or concerns.  Roland Rack, MD [facility] [phone]  If 7pm- 7am, please page neurology on call as listed in [place].

## 2021-02-10 NOTE — Progress Notes (Signed)
PROGRESS NOTE    Lawrence [name]  JJH:[id number] DOB: 1956-06-02 DOA: 02/08/2021 PCP: [name], PA-C    Brief Narrative:  Lawrence [name] is a 65 year old male with past medical history significant for essential hypertension, type 2 diabetes mellitus, COPD, diastolic congestive heart failure, moderate aortic stenosis, seizure disorder, OSA on home BiPAP, hyperlipidemia, gout, and depression presents with complaints of malaise which started yesterday.  He normally feels like this when he goes into atrial fibrillation.  That evening he started having some left lower extremity weakness, but symptoms persisted today and he still felt no better.  Patient was noted to be more lethargic than usual with reports of intermittent staring off spells.  His girlfriend notes that he has had increasing redness of his lower extremities over the last few days.  Redness is worse on the right leg as opposed to the left.  He had last been treated for cellulitis 3-4 months ago.  Other associated symptoms include tingling sensations of both legs.  He reports that his weight is actually down to 382 pounds and his efforts to try and lose weight denies any fever, chest pain, shortness of breath, nausea, vomiting, diarrhea, or seizure-like activity symptoms.    Recently been hospitalized from 6/20-6/21 with transient episode of left-sided weakness which MRI of the brain was negative for any acute abnormality.  Symptoms were thought possibly related to seizure-like activity, but EEG was negative.  Patient was recommended to continue on Lyrica 100 mg 3 times daily.   In the emergency department patient was seen to be in atrial fibrillation with heart rates elevated up to 150s, and all other vital signs maintained.  Labs significant for WBC 12.2, potassium 4.1, magnesium 2.1, and glucose 207.  CT scan of the brain showed no acute abnormalities.  Patient had been given 1 L normal saline IV fluids and started on Cardizem drip.   Metoprolol IV which had initially been ordered was never given due to patient's low blood pressures. [facility] called to admit.   Assessment & Plan:   Active Problems:   OSA (obstructive sleep apnea)   Morbid obesity (HCC)   Essential hypertension, benign   Chronic diastolic CHF (congestive heart failure) (HCC)   Leukocytosis   Bilateral lower leg cellulitis   Do not resuscitate status   Type 2 diabetes mellitus with diabetic polyneuropathy, with long-term current use of insulin (HCC)   COPD (chronic obstructive pulmonary disease) (HCC)   Atrial fibrillation with rapid ventricular response (HCC)   Left-sided weakness   Atrial fibrillation with RVR  Patient presents in A. fib with RVR with heart rates into the 150s.  Patient was initially started on a Cardizem drip and now transitioned to oral medications.  TTE 01/26/21 with LVEF 70-75%, no LV regional wall motion abnormalities, moderate aortic valve stenosis. --Cardiology following, appreciate assistance --Metoprolol succinate 50 mg p.o. daily --Cardizem 30 mg p.o. every 6 hours --Apixaban 5 mg p.o. twice daily --Continue monitor on telemetry   Left-sided weakness:  During prior hospitalization in June patient had similar left-sided weakness.  Symptoms thought to be secondary to a TIA at that time his MRI was negative.  However, it appears that it was also brought up the possibility of partial seizures as the cause of patient's symptoms.  CT head without contrast with no acute intercranial abnormalities, mild chronic microvascular ischemic change.  MR brain without contrast with no evidence of recent infarction, hemorrhage or mass, no significant change since recent prior study.  CT C-spine without  contrast Motion degraded limiting evaluation, mild spinal canal stenosis with multilevel neural foraminal narrowing greatest on the left C2-C3, bilaterally C3-4, right C4-5 and bilaterally at C5-6 and trace grade 1 spondylolisthesis at C3-4, C4-5, and  C5-6. --Neurology recommends outpatient follow-up with EMG with [facility] --Continue PT/OT efforts while inpatient with plan discharge home with home health   Cellulitis of the lower extremities:  Patient noted to have increasing redness of the bilateral lower extremities over the last couple of days.  Increased warmth to palpation noted. --Ceftriaxone 2 g IV every 24 hours   Diabetes mellitus type II: Home insulin regimen includes Farxiga 10 mg daily with breakfast, Trulicity 4.5 mg weekly, Lantus 12 units every morning, and metformin 1000 mg twice daily. --Continue Lantus 12 units daily --NovoLog 4 units 3 times daily AC --SSI for further coverage --CBG before every meal/at bedtime   Chronic diastolic congestive heart failure: Compensated TTE 01/26/21 with LVEF 70-75%, no LV regional wall motion abnormalities, moderate aortic valve stenosis. --Strict intake and output --Daily weights --Continue torsemide   Seizure disorder:  Patient had previously been observed having possible seizures during hospitalization in March of this year and was discharged home on Keppra.  However, patient did not continue after prescription ran out.  During last hospitalization in June neurology evaluated and recommended continue Lyrica 3 times daily.  It was brought up the possibility of increasing his Lyrica dose during last hospitalization 150 mg 3 times daily.  However this was not done as symptoms were thought more so secondary to a TIA.   -Continue Lyrica 100 mg p.o. 3 times daily   Essential hypertension:  -Held isosorbide mononitrate, prazosin, spironolactone --Metoprolol succinate 50 mg p.o. daily --Started on a Cardizem 30 mg p.o. every 6 hours --Spironolactone 25 mg p.o. daily --Torsemide 40 mg PO qAm and 20mg  PO qPM   COPD:  --DuoNeb treatments as needed for shortness of breath/wheezing   Depression and anxiety --Abilify 5 mg p.o. nightly --BuSpar 5mg  PO TID --Effexor to 25 mg p.o. daily    Hyperlipidemia --Continue atorvastatin 40 mg p.o. daily   OSA --Continue BiPAP nightly  GERD: Continue PPI  Insomnia: Trazodone 200 mg p.o. nightly  Iron deficiency anemia --Ferrous sulfate 325 mg p.o. daily   Morbid obesity:  Body mass index is 49.56 kg/m.  Discussed with patient needs for aggressive lifestyle changes/weight loss as this complicates all facets of care.  Outpatient follow-up with PCP.  May benefit from bariatric evaluation outpatient.   DVT prophylaxis:  apixaban (ELIQUIS) tablet 5 mg    Code Status: DNR Family Communication: None present at bedside this morning  Disposition Plan:  Level of care: Telemetry Medical Status is: Inpatient  Remains inpatient appropriate because:Unsafe d/c plan, IV treatments appropriate due to intensity of illness or inability to take PO, and Inpatient level of care appropriate due to severity of illness  Dispo: The patient is from: Home              Anticipated d/c is to: Home              Patient currently is not medically stable to d/c.   Difficult to place patient No  Consultants:  Neurology Cardiology  Procedures:  None  Antimicrobials:  Ceftriaxone 7/4>>    Subjective: Patient seen examined bedside, resting comfortably.  No specific complaints this morning; and reports his left-sided weakness is much improved and close to his normal baseline.  Heart rate remains poorly controlled, cardiology adding Cardizem today.  Denies headache, no chest pain, no palpitations, no shortness of breath, no abdominal pain, no weakness, no fatigue, no paresthesias.  No acute events overnight per nursing staff.  Objective: Vitals:   02/10/21 0848 02/10/21 1209 02/10/21 1636 02/10/21 1710  BP: 127/84 127/79 128/80 128/80  Pulse:  72 87   Resp: 18 18 18    Temp: 97.9 F (36.6 C)  97.8 F (36.6 C)   TempSrc: Oral  Oral   SpO2:  91% 91%   Weight:      Height:        Intake/Output Summary (Last 24 hours) at 02/10/2021  1751 Last data filed at 02/10/2021 1536 Gross per 24 hour  Intake 707.74 ml  Output 2825 ml  Net -2117.26 ml   Filed Weights   02/08/21 2015  Weight: (!) 175.1 kg    Examination:  General exam: Appears calm and comfortable, obese Respiratory system: Clear to auscultation. Respiratory effort normal.  On 1.5 L nasal cannula with SPO2 92% at rest Cardiovascular system: S1 & S2 heard, irregularly irregular rhythm, normal rate. No JVD, murmurs, rubs, gallops or clicks. No pedal edema. Gastrointestinal system: Abdomen is nondistended, soft and nontender. No organomegaly or masses felt. Normal bowel sounds heard. Central nervous system: Alert and oriented. No focal neurological deficits. Extremities: Symmetric 5 x 5 power. Skin: Bilateral lower extremity edema with erythema, mild tenderness to palpation Psychiatry: Judgement and insight appear normal. Mood & affect appropriate.     Data Reviewed: I have personally reviewed following labs and imaging studies  CBC: Recent Labs  Lab 02/08/21 1536 02/09/21 0244 02/10/21 0401  WBC 12.2* 11.9* 10.5  NEUTROABS  --   --  7.8*  HGB 14.3 13.4 14.1  HCT 44.2 41.3 43.3  MCV 91.3 90.6 90.4  PLT 320 314 419   Basic Metabolic Panel: Recent Labs  Lab 02/08/21 1536 02/09/21 0244 02/09/21 1304 02/10/21 0401  NA 135 138  --  135  K 4.1 3.4*  --  3.6  CL 96* 100  --  99  CO2 26 31  --  27  GLUCOSE 207* 160*  --  175*  BUN 21 16  --  16  CREATININE 0.93 0.89  --  1.03  CALCIUM 9.5 8.8*  --  9.0  MG 2.1  --  2.2  --    GFR: Estimated Creatinine Clearance: 124 mL/min (by C-G formula based on SCr of 1.03 mg/dL). Liver Function Tests: No results for input(s): AST, ALT, ALKPHOS, BILITOT, PROT, ALBUMIN in the last 168 hours. No results for input(s): LIPASE, AMYLASE in the last 168 hours. No results for input(s): AMMONIA in the last 168 hours. Coagulation Profile: No results for input(s): INR, PROTIME in the last 168 hours. Cardiac  Enzymes: No results for input(s): CKTOTAL, CKMB, CKMBINDEX, TROPONINI in the last 168 hours. BNP (last 3 results) No results for input(s): PROBNP in the last 8760 hours. HbA1C: No results for input(s): HGBA1C in the last 72 hours. CBG: Recent Labs  Lab 02/09/21 0742 02/09/21 1108 02/09/21 1617 02/09/21 2037 02/10/21 0736  GLUCAP 237* 270* 181* 161* 177*   Lipid Profile: No results for input(s): CHOL, HDL, LDLCALC, TRIG, CHOLHDL, LDLDIRECT in the last 72 hours. Thyroid Function Tests: Recent Labs    02/09/21 1304  TSH 1.442   Anemia Panel: No results for input(s): VITAMINB12, FOLATE, FERRITIN, TIBC, IRON, RETICCTPCT in the last 72 hours. Sepsis Labs: No results for input(s): PROCALCITON, LATICACIDVEN in the last 168 hours.  Recent Results (from  the past 240 hour(s))  SARS CORONAVIRUS 2 (TAT 6-24 HRS) Nasopharyngeal Nasopharyngeal Swab     Status: None   Collection Time: 02/08/21  4:07 PM   Specimen: Nasopharyngeal Swab  Result Value Ref Range Status   SARS Coronavirus 2 NEGATIVE NEGATIVE Final    Comment: (NOTE) SARS-CoV-2 target nucleic acids are NOT DETECTED.  The SARS-CoV-2 RNA is generally detectable in upper and lower respiratory specimens during the acute phase of infection. Negative results do not preclude SARS-CoV-2 infection, do not rule out co-infections with other pathogens, and should not be used as the sole basis for treatment or other patient management decisions. Negative results must be combined with clinical observations, patient history, and epidemiological information. The expected result is Negative.  Fact Sheet for Patients: SugarRoll.be  Fact Sheet for Healthcare Providers: https://www.woods-mathews.com/  This test is not yet approved or cleared by the [place] FDA and  has been authorized for detection and/or diagnosis of SARS-CoV-2 by FDA under an Emergency Use Authorization (EUA). This EUA will  remain  in effect (meaning this test can be used) for the duration of the COVID-19 declaration under Se ction 564(b)(1) of the Act, 21 U.S.C. section 360bbb-3(b)(1), unless the authorization is terminated or revoked sooner.  Performed at [facility], [place] [address], [place], [place] [address]          Radiology Studies: MR BRAIN WO CONTRAST  Result Date: 02/08/2021 CLINICAL DATA:  Left lower extremity weakness EXAM: MRI HEAD WITHOUT CONTRAST TECHNIQUE: Multiplanar, multiecho pulse sequences of the brain and surrounding structures were obtained without intravenous contrast. COMPARISON:  01/25/2021 FINDINGS: Brain: There is no acute infarction or intracranial hemorrhage. There is no intracranial mass, mass effect, or edema. There is no hydrocephalus or extra-axial fluid collection. Ventricles and sulci are stable in size and configuration. Patchy foci of T2 hyperintensity in the supratentorial white matter are nonspecific but may reflect stable chronic microvascular ischemic changes. Vascular: Major vessel flow voids at the skull base are preserved. Skull and upper cervical spine: Normal marrow signal is preserved. Sinuses/Orbits: Patchy mucosal thickening.  Orbits are unremarkable. Other: Sella is unremarkable.  Mastoid air cells are clear. IMPRESSION: No evidence of recent infarction, hemorrhage, or mass. No significant change since recent prior study. Electronically Signed   By: Macy Mis M.D.   On: 02/08/2021 18:56   MR CERVICAL SPINE WO CONTRAST  Result Date: 02/09/2021 CLINICAL DATA:  Provided history: Spinal stenosis, cervical spine. Additional history provided: Patient with right upper extremity shaking, concern for partial seizure, lethargy, left lower extremity weakness. EXAM: MRI CERVICAL SPINE WITHOUT CONTRAST TECHNIQUE: Multiplanar, multisequence MR imaging of the cervical spine was performed. No intravenous contrast was administered. COMPARISON:  CT angiogram neck  01/25/2021 FINDINGS: The examination is intermittently motion degraded, limiting evaluation. Most notably, there is moderate/severe motion degradation of the axial T2 weighted sequences. Alignment: Mild cervical dextrocurvature. Straightening of the expected cervical lordosis. Trace C3-C4 and C4-C5 grade 1 anterolisthesis. Trace C5-C6 grade 1 retrolisthesis. Vertebrae: Vertebral body height is maintained. Facet joint ankylosis on the left at C2-C3. Multilevel degenerative endplate irregularity. Mild degenerative endplate edema at W2-B7, C6-C7 and C7-T1. Multilevel ventral osteophytes, most prominent at C5-C6. Cord: Within the limitations of motion degradation, no spinal cord signal abnormality is identified. Posterior Fossa, vertebral arteries, paraspinal tissues: No acute finding within included portions of the posterior fossa. Flow voids preserved within the imaged cervical vertebral arteries. Paraspinal soft tissues unremarkable. Disc levels: Moderate disc degeneration at C5-C6, C6-C7 and C7-T1.  Mild disc degeneration at the remaining levels. Borderline congenitally narrow cervical spinal canal. C2-C3: Shallow disc bulge. Left-sided disc osteophyte ridge/uncinate hypertrophy. Facet arthrosis (predominantly on the left). Mild partial effacement of the ventral thecal sac without significant spinal cord mass effect. Severe left neural foraminal narrowing. C3-C4: Trace grade 1 anterolisthesis. Disc uncovering with disc bulge and bilateral disc osteophyte ridge/uncinate hypertrophy. Facet arthrosis. Mild ligamentum flavum hypertrophy. Mild relative spinal canal narrowing with likely contact upon the ventral spinal cord. Severe bilateral neural foraminal narrowing. C4-C5: Trace grade 1 anterolisthesis. Disc uncovering with disc bulge and bilateral uncovertebral hypertrophy. Moderate/advanced facet arthrosis. Mild ligamentum flavum hypertrophy. Mild spinal canal stenosis with likely contact upon the ventral spinal cord.  Motion degradation limits evaluation of the neural foramina. Bilateral neural foraminal narrowing (moderate/severe right, moderate left). C5-C6: Trace grade 1 retrolisthesis. Posterior disc osteophyte complex with bilateral disc osteophyte ridge/uncinate hypertrophy. Facet arthrosis. Minimal ligamentum flavum hypertrophy. Mild spinal canal stenosis with contact upon the ventral spinal cord. Severe bilateral neural foraminal narrowing. C6-C7: Posterior disc osteophyte complex with bilateral disc osteophyte ridge/uncinate hypertrophy. Facet arthrosis (greater on the left). Mild relative spinal canal narrowing without significant spinal cord mass effect. Severe bilateral neural foraminal narrowing. C7-T1: Shallow disc bulge with mild endplate spurring/osteophytic ridging. No significant spinal canal stenosis. Mild relative right neural foraminal narrowing. IMPRESSION: Motion degraded examination, as described and limiting evaluation. Cervical spondylosis, as outlined. No more than mild spinal canal stenosis. Multilevel neural foraminal narrowing, as detailed and greatest on the left at C2-C3 (severe), bilaterally at C3-C4 (severe), on the right at C4-C5 (moderate/severe), bilaterally at C5-C6 (severe) and bilaterally at C6-C7 (severe). Additional sites of mild and moderate foraminal stenosis, as above. Mild degenerative endplate edema at T1-X7, C6-C7 and C7-T1. Mild cervical dextrocurvature. Straightening of the expected cervical lordosis. Trace grade 1 spondylolisthesis at C3-C4, C4-C5 and C5-C6. Electronically Signed   By: Kellie Simmering DO   On: 02/09/2021 12:58   DG CHEST PORT 1 VIEW  Result Date: 02/08/2021 CLINICAL DATA:  Afib with RVR. Hx of DM, asthma, COPD, CHF, HTN. Pt is a former smoker. EXAM: PORTABLE CHEST 1 VIEW COMPARISON:  Chest x-ray 01/25/2021, CT chest 09/09/2019 FINDINGS: The heart size and mediastinal contours are unchanged. Bibasilar streaky airspace opacities likely representing atelectasis. No  focal consolidation. No pulmonary edema. No pleural effusion. No pneumothorax. No acute osseous abnormality. Query old healed posterior 6 rib fracture. IMPRESSION: No active disease. Electronically Signed   By: Iven Finn M.D.   On: 02/08/2021 20:03        Scheduled Meds:  allopurinol  100 mg Oral BID   apixaban  5 mg Oral BID   ARIPiprazole  5 mg Oral QHS   atorvastatin  40 mg Oral QPM   busPIRone  5 mg Oral TID   diltiazem  30 mg Oral Q6H   ferrous sulfate  325 mg Oral Q breakfast   fluticasone  2 spray Each Nare Daily   insulin aspart  0-5 Units Subcutaneous QHS   insulin aspart  0-9 Units Subcutaneous TID WC   insulin aspart  4 Units Subcutaneous TID WC   insulin glargine  12 Units Subcutaneous Daily   loratadine  10 mg Oral Daily   metoprolol succinate  50 mg Oral Daily   pantoprazole  40 mg Oral Daily   potassium chloride  40 mEq Oral Daily   pregabalin  100 mg Oral TID   silver sulfADIAZINE  1 application Topical Daily   spironolactone  25 mg  Oral Daily   temazepam  15 mg Oral QHS   torsemide  20 mg Oral q1800   torsemide  40 mg Oral Daily   traZODone  200 mg Oral QHS   venlafaxine XR  225 mg Oral Q breakfast   Continuous Infusions:  cefTRIAXone (ROCEPHIN)  IV 2 g (02/10/21 1720)     LOS: 1 day    Time spent: 39 minutes spent on chart review, discussion with nursing staff, consultants, updating family and interview/physical exam; more than 50% of that time was spent in counseling and/or coordination of care.    Belisa Eichholz J [place] DO [facility] Available via Epic secure chat 7am-7pm After these hours, please refer to coverage provider listed on [email or web address] 02/10/2021, 5:51 PM

## 2021-02-10 NOTE — Evaluation (Signed)
Occupational Therapy Evaluation Patient Details Name: Lawrence [name] MRN: [id number] DOB: 1955/10/19 Today's Date: 02/10/2021    History of Present Illness Lawrence [name] is a 65 y.o. male with medical history significant of hypertension, hyperlipidemia, DM type II, COPD,  paroxysmal atrial fibrillation on Eliquis, diastolic CHF( last EF 41-96%), moderate aortic stenosis, seizure disorder, OSA on BiPAP, gout, and depression presents with complaints of malaise which started yesterday.  He normally feels like this when he goes into atrial fibrillation.  MRI  negative for stroke. neurology Consulted:Suspected cerebral infarction due to occlusion of stenosis of small artery. MRI cervical spine is pending.. Patient  was recently [place] from hospital 01/26/21.:   Clinical Impression   Pt in bed upon arrival, agreeable to activity. Bariatric RW unable to be located at time of eval (pt now has in his room). Pt sat EOB with no physical assist/Sup. PTA pt lived at home with his girlfriend, pt Ind with UB ADLs, grooming and toileting; has assist from aide for LB ADLs at baseline. Pt would benefit from 1 more session with acute OT services for toilet transfer safety trg and toiletnig tasks, grooming at sink to maximize level of function and safety    Follow Up Recommendations  No OT follow up;Supervision - Intermittent    Equipment Recommendations  3 in 1 bedside commode (bariatric 3 in 1)    Recommendations for Other Services       Precautions / Restrictions Precautions Precautions: Fall Restrictions Weight Bearing Restrictions: No      Mobility Bed Mobility Overal bed mobility: Needs Assistance Bed Mobility: Supine to Sit;Sit to Supine     Supine to sit: Supervision     General bed mobility comments: HOB elevated; increased time/effort, momentum to rise    Transfers Overall transfer level: Needs assistance Equipment used: Rolling walker (2 wheeled) Transfers: Sit to/from Stand Sit to  Stand: Min assist;Min guard              Balance Overall balance assessment: Needs assistance Sitting-balance support: Feet supported Sitting balance-Leahy Scale: Good     Standing balance support: Bilateral upper extremity supported;During functional activity Standing balance-Leahy Scale: Fair                             ADL either performed or assessed with clinical judgement   ADL Overall ADL's : Needs assistance/impaired Eating/Feeding: Independent;Sitting   Grooming: Wash/dry hands;Wash/dry face;Min guard;Standing   Upper Body Bathing: Set up;Independent   Lower Body Bathing: Moderate assistance Lower Body Bathing Details (indicate cue type and reason): at baseline Upper Body Dressing : Set up;Independent   Lower Body Dressing: Moderate assistance Lower Body Dressing Details (indicate cue type and reason): at baseline Toilet Transfer: Stand-pivot;Min guard Toilet Transfer Details (indicate cue type and reason): simulated sit - stand from EOB to chair, bariatric RW unavailable at this time Toileting- Water quality scientist and Hygiene: Min guard;Sitting/lateral lean Toileting - Clothing Manipulation Details (indicate cue type and reason): simulated at EOB       General ADL Comments: Patient functioning at baseline with ADLs/selfcare     Vision Baseline Vision/History: Wears glasses Patient Visual Report: No change from baseline       Perception     Praxis      Pertinent Vitals/Pain Pain Assessment: Faces Faces Pain Scale: Hurts a little bit Pain Descriptors / Indicators: Aching Pain Intervention(s): Monitored during session;Repositioned     Hand Dominance Right   Extremity/Trunk Assessment Upper  Extremity Assessment Upper Extremity Assessment: Overall WFL for tasks assessed   Lower Extremity Assessment Lower Extremity Assessment: Defer to PT evaluation   Cervical / Trunk Assessment Cervical / Trunk Assessment: Other  exceptions Cervical / Trunk Exceptions: Body habitus   Communication Communication Communication: No difficulties   Cognition Arousal/Alertness: Awake/alert Behavior During Therapy: WFL for tasks assessed/performed Overall Cognitive Status: Within Functional Limits for tasks assessed                                     General Comments       Exercises     Shoulder Instructions      Home Living Family/patient expects to be discharged to:: Private residence Living Arrangements: Spouse/significant other Available Help at Discharge: Family;Available 24 hours/day Type of Home: Apartment Home Access: Level entry     Home Layout: One level     Bathroom Shower/Tub: Teacher, early years/pre: Handicapped height     Home Equipment: Environmental consultant - 4 wheels;Electric scooter;Hospital bed;Adaptive equipment Adaptive Equipment: Reacher Additional Comments: has shower seat and bsc but they won't fit in bathroom      Prior Functioning/Environment Level of Independence: Needs assistance  Gait / Transfers Assistance Needed: walks  40' max distance with rollator, uses scooter out, Uses transsportation   services frequently" I cannot get in a car". ADL's / Homemaking Assistance Needed: HHaide MTW(3hrs) and F(4hrs); assist with LB bathing/dressing   Comments: does not drive, spouse not driving as well        OT Problem List: Decreased activity tolerance      OT Treatment/Interventions: Self-care/ADL training;Patient/family education;Therapeutic activities;DME and/or AE instruction    OT Goals(Current goals can be found in the care plan section) Acute Rehab OT Goals Patient Stated Goal: To return home. OT Goal Formulation: With patient Time For Goal Achievement: 02/24/21 Potential to Achieve Goals: Good ADL Goals Pt Will Perform Grooming: with set-up;with supervision;with modified independence;standing Pt Will Transfer to Toilet: with supervision;with modified  independence Pt Will Perform Toileting - Clothing Manipulation and hygiene: with supervision;with modified independence;sit to/from stand  OT Frequency: Min 2X/week   Barriers to D/C:            Co-evaluation              AM-PAC OT "6 Clicks" Daily Activity     Outcome Measure Help from another person eating meals?: None Help from another person taking care of personal grooming?: A Little Help from another person toileting, which includes using toliet, bedpan, or urinal?: A Little Help from another person bathing (including washing, rinsing, drying)?: A Lot Help from another person to put on and taking off regular upper body clothing?: None Help from another person to put on and taking off regular lower body clothing?: A Lot 6 Click Score: 18   End of Session Nurse Communication: Mobility status  Activity Tolerance: Patient tolerated treatment well Patient left: in bed;with call bell/phone within reach;with nursing/sitter in room (sitting EOB)  OT Visit Diagnosis: Muscle weakness (generalized) (M62.81)                Time: 5625-6389 OT Time Calculation (min): 19 min Charges:  OT General Charges $OT Visit: 1 Visit OT Evaluation $OT Eval Low Complexity: 1 Low    Britt Bottom 02/10/2021, 2:00 PM

## 2021-02-10 NOTE — Progress Notes (Addendum)
Progress Note  Patient Name: Lawrence [name] Date of Encounter: 02/10/2021  [facility] Cardiologist: Fransico Him, MD   Subjective   Patient states he is feeling well, denied any chest pain, SOB, heart palpitation, and dizziness. He states he is having improved redness of his legs, still tender. He has not been OOB ambulating.   Inpatient Medications    Scheduled Meds:  allopurinol  100 mg Oral BID   apixaban  5 mg Oral BID   ARIPiprazole  5 mg Oral QHS   atorvastatin  40 mg Oral QPM   busPIRone  5 mg Oral TID   ferrous sulfate  325 mg Oral Q breakfast   fluticasone  2 spray Each Nare Daily   insulin aspart  0-5 Units Subcutaneous QHS   insulin aspart  0-9 Units Subcutaneous TID WC   insulin aspart  4 Units Subcutaneous TID WC   insulin glargine  12 Units Subcutaneous Daily   loratadine  10 mg Oral Daily   metoprolol succinate  50 mg Oral Daily   pantoprazole  40 mg Oral Daily   potassium chloride  40 mEq Oral Daily   pregabalin  100 mg Oral TID   silver sulfADIAZINE  1 application Topical Daily   spironolactone  25 mg Oral Daily   temazepam  15 mg Oral QHS   torsemide  20 mg Oral q1800   torsemide  40 mg Oral Daily   traZODone  200 mg Oral QHS   venlafaxine XR  225 mg Oral Q breakfast   Continuous Infusions:  cefTRIAXone (ROCEPHIN)  IV Stopped (02/09/21 1809)   PRN Meds: acetaminophen **OR** acetaminophen, metoprolol tartrate, morphine injection, ondansetron **OR** ondansetron (ZOFRAN) IV, oxyCODONE   Vital Signs    Vitals:   02/09/21 1619 02/09/21 2027 02/10/21 0007 02/10/21 0434  BP: 119/74 111/67 107/69 112/73  Pulse: 74 87 99 79  Resp: 18 18 20 20   Temp: 97.6 F (36.4 C) 97.8 F (36.6 C) 98.5 F (36.9 C) 97.7 F (36.5 C)  TempSrc: Oral Oral Axillary Oral  SpO2: 91% 90% 90% 92%  Weight:      Height:        Intake/Output Summary (Last 24 hours) at 02/10/2021 0756 Last data filed at 02/10/2021 0434 Gross per 24 hour  Intake 1166.86 ml  Output 1830  ml  Net -663.14 ml   Last 3 Weights 02/08/2021 01/25/2021 01/07/2021  Weight (lbs) 386 lb 0.4 oz 388 lb 389 lb  Weight (kg) 175.1 kg 175.996 kg 176.449 kg  Some encounter information is confidential and restricted. Go to Review Flowsheets activity to see all data.      Telemetry    A fib with ventricular rate of 110-120s this AM - Personally Reviewed  ECG    N/A for this AM - Personally Reviewed  Physical Exam   GEN: No acute distress.   Neck: No JVD Cardiac: Irregularly irregular, no murmurs, rubs, or gallops.  Respiratory: Clear to auscultation bilaterally. On 2LNC pox 94% GI: Soft, nontender, non-distended  MS: BLE chronic lymphedema with erythema and tenderness; No deformity. Neuro:  Nonfocal  Psych: Normal affect   Labs    High Sensitivity Troponin:  No results for input(s): TROPONINIHS in the last 720 hours.    Chemistry Recent Labs  Lab 02/08/21 1536 02/09/21 0244 02/10/21 0401  NA 135 138 135  K 4.1 3.4* 3.6  CL 96* 100 99  CO2 26 31 27   GLUCOSE 207* 160* 175*  BUN 21 16 16  CREATININE 0.93 0.89 1.03  CALCIUM 9.5 8.8* 9.0  GFRNONAA >60 >60 >60  ANIONGAP 13 7 9      Hematology Recent Labs  Lab 02/08/21 1536 02/09/21 0244 02/10/21 0401  WBC 12.2* 11.9* 10.5  RBC 4.84 4.56 4.79  HGB 14.3 13.4 14.1  HCT 44.2 41.3 43.3  MCV 91.3 90.6 90.4  MCH 29.5 29.4 29.4  MCHC 32.4 32.4 32.6  RDW 15.2 15.3 15.4  PLT 320 314 304    BNPNo results for input(s): BNP, PROBNP in the last 168 hours.   DDimer No results for input(s): DDIMER in the last 168 hours.   Radiology    CT Head Wo Contrast  Result Date: 02/08/2021 CLINICAL DATA:  PT BIB EMS from home. Pt has a hx of afib and usually can convert himself out of it. Pts HR is 120-160. Pt is axox4. Pt was put on 2L by EMS. EXAM: CT HEAD WITHOUT CONTRAST TECHNIQUE: Contiguous axial images were obtained from the base of the skull through the vertex without intravenous contrast. COMPARISON:  01/25/2021 FINDINGS:  Brain: No evidence of acute infarction, hemorrhage, hydrocephalus, extra-axial collection or mass lesion/mass effect. Mild periventricular white matter hypoattenuation noted consistent with chronic microvascular ischemic change. Vascular: No hyperdense vessel or unexpected calcification. Skull: Normal. Negative for fracture or focal lesion. Sinuses/Orbits: Small amount of dependent fluid in the left sphenoid sinus. Mild scattered areas of ethmoid sinus mucosal thickening. Mild polypoid mucosal thickening at the floor of the right maxillary sinus. Other: None. IMPRESSION: 1. No acute intracranial abnormalities. 2. Mild chronic microvascular ischemic change. Electronically Signed   By: Lajean Manes M.D.   On: 02/08/2021 13:05   MR BRAIN WO CONTRAST  Result Date: 02/08/2021 CLINICAL DATA:  Left lower extremity weakness EXAM: MRI HEAD WITHOUT CONTRAST TECHNIQUE: Multiplanar, multiecho pulse sequences of the brain and surrounding structures were obtained without intravenous contrast. COMPARISON:  01/25/2021 FINDINGS: Brain: There is no acute infarction or intracranial hemorrhage. There is no intracranial mass, mass effect, or edema. There is no hydrocephalus or extra-axial fluid collection. Ventricles and sulci are stable in size and configuration. Patchy foci of T2 hyperintensity in the supratentorial white matter are nonspecific but may reflect stable chronic microvascular ischemic changes. Vascular: Major vessel flow voids at the skull base are preserved. Skull and upper cervical spine: Normal marrow signal is preserved. Sinuses/Orbits: Patchy mucosal thickening.  Orbits are unremarkable. Other: Sella is unremarkable.  Mastoid air cells are clear. IMPRESSION: No evidence of recent infarction, hemorrhage, or mass. No significant change since recent prior study. Electronically Signed   By: Macy Mis M.D.   On: 02/08/2021 18:56   MR CERVICAL SPINE WO CONTRAST  Result Date: 02/09/2021 CLINICAL DATA:  Provided  history: Spinal stenosis, cervical spine. Additional history provided: Patient with right upper extremity shaking, concern for partial seizure, lethargy, left lower extremity weakness. EXAM: MRI CERVICAL SPINE WITHOUT CONTRAST TECHNIQUE: Multiplanar, multisequence MR imaging of the cervical spine was performed. No intravenous contrast was administered. COMPARISON:  CT angiogram neck 01/25/2021 FINDINGS: The examination is intermittently motion degraded, limiting evaluation. Most notably, there is moderate/severe motion degradation of the axial T2 weighted sequences. Alignment: Mild cervical dextrocurvature. Straightening of the expected cervical lordosis. Trace C3-C4 and C4-C5 grade 1 anterolisthesis. Trace C5-C6 grade 1 retrolisthesis. Vertebrae: Vertebral body height is maintained. Facet joint ankylosis on the left at C2-C3. Multilevel degenerative endplate irregularity. Mild degenerative endplate edema at P2-Z3, C6-C7 and C7-T1. Multilevel ventral osteophytes, most prominent at C5-C6. Cord: Within the limitations of  motion degradation, no spinal cord signal abnormality is identified. Posterior Fossa, vertebral arteries, paraspinal tissues: No acute finding within included portions of the posterior fossa. Flow voids preserved within the imaged cervical vertebral arteries. Paraspinal soft tissues unremarkable. Disc levels: Moderate disc degeneration at C5-C6, C6-C7 and C7-T1. Mild disc degeneration at the remaining levels. Borderline congenitally narrow cervical spinal canal. C2-C3: Shallow disc bulge. Left-sided disc osteophyte ridge/uncinate hypertrophy. Facet arthrosis (predominantly on the left). Mild partial effacement of the ventral thecal sac without significant spinal cord mass effect. Severe left neural foraminal narrowing. C3-C4: Trace grade 1 anterolisthesis. Disc uncovering with disc bulge and bilateral disc osteophyte ridge/uncinate hypertrophy. Facet arthrosis. Mild ligamentum flavum hypertrophy. Mild  relative spinal canal narrowing with likely contact upon the ventral spinal cord. Severe bilateral neural foraminal narrowing. C4-C5: Trace grade 1 anterolisthesis. Disc uncovering with disc bulge and bilateral uncovertebral hypertrophy. Moderate/advanced facet arthrosis. Mild ligamentum flavum hypertrophy. Mild spinal canal stenosis with likely contact upon the ventral spinal cord. Motion degradation limits evaluation of the neural foramina. Bilateral neural foraminal narrowing (moderate/severe right, moderate left). C5-C6: Trace grade 1 retrolisthesis. Posterior disc osteophyte complex with bilateral disc osteophyte ridge/uncinate hypertrophy. Facet arthrosis. Minimal ligamentum flavum hypertrophy. Mild spinal canal stenosis with contact upon the ventral spinal cord. Severe bilateral neural foraminal narrowing. C6-C7: Posterior disc osteophyte complex with bilateral disc osteophyte ridge/uncinate hypertrophy. Facet arthrosis (greater on the left). Mild relative spinal canal narrowing without significant spinal cord mass effect. Severe bilateral neural foraminal narrowing. C7-T1: Shallow disc bulge with mild endplate spurring/osteophytic ridging. No significant spinal canal stenosis. Mild relative right neural foraminal narrowing. IMPRESSION: Motion degraded examination, as described and limiting evaluation. Cervical spondylosis, as outlined. No more than mild spinal canal stenosis. Multilevel neural foraminal narrowing, as detailed and greatest on the left at C2-C3 (severe), bilaterally at C3-C4 (severe), on the right at C4-C5 (moderate/severe), bilaterally at C5-C6 (severe) and bilaterally at C6-C7 (severe). Additional sites of mild and moderate foraminal stenosis, as above. Mild degenerative endplate edema at C1-K4, C6-C7 and C7-T1. Mild cervical dextrocurvature. Straightening of the expected cervical lordosis. Trace grade 1 spondylolisthesis at C3-C4, C4-C5 and C5-C6. Electronically Signed   By: Kellie Simmering DO    On: 02/09/2021 12:58   DG CHEST PORT 1 VIEW  Result Date: 02/08/2021 CLINICAL DATA:  Afib with RVR. Hx of DM, asthma, COPD, CHF, HTN. Pt is a former smoker. EXAM: PORTABLE CHEST 1 VIEW COMPARISON:  Chest x-ray 01/25/2021, CT chest 09/09/2019 FINDINGS: The heart size and mediastinal contours are unchanged. Bibasilar streaky airspace opacities likely representing atelectasis. No focal consolidation. No pulmonary edema. No pleural effusion. No pneumothorax. No acute osseous abnormality. Query old healed posterior 6 rib fracture. IMPRESSION: No active disease. Electronically Signed   By: Iven Finn M.D.   On: 02/08/2021 20:03    Cardiac Studies   Echo from 01/26/21:    1. Limited study for LV function; limited doppler and not all views  obtained.   2. Left ventricular ejection fraction, by estimation, is 70 to 75%. The  left ventricle has hyperdynamic function. The left ventricle has no  regional wall motion abnormalities.   3. Right ventricular systolic function is normal. The right ventricular  size is normal.   4. The mitral valve is normal in structure. No evidence of mitral  stenosis.   5. The aortic valve has an indeterminant number of cusps. Moderate aortic  valve stenosis.   Myoview 11/26/20:   Nuclear stress EF: 55%. The left ventricular ejection fraction is  normal (55-65%). There was no ST segment deviation noted during stress. The study is normal. This is a low risk study.     [facility] on 01/17/2018:   There is hyperdynamic left ventricular systolic function. LV end diastolic pressure is normal. The left ventricular ejection fraction is greater than 65% by visual estimate. There is no mitral valve regurgitation. There is no aortic valve stenosis. Prox LAD to Mid LAD lesion is 40% stenosed. Prox Cx to Mid Cx lesion is 20% stenosed. Ost 2nd Mrg lesion is 20% stenosed.   Angiographically minimal coronary disease with ectatic RCA and circumflex. Normal LVEDP and normal LV  function/hyperdynamic.     Plan: Return to nursing unit for post catheterization care & TR band removal. Would be okay for discharge from cardiac standpoint after bedrest post TR band removal.   Defer further plans to primary team and consulting cardiologist.    Patient Profile     Lawrence [name] is a 65 y.o. male with a hx of  hypertension, hyperlipidemia, DM type II, COPD,  paroxysmal atrial fibrillation on Eliquis, diastolic CHF( last EF 47-09%), moderate aortic stenosis, seizure disorder, OSA on BiPAP, gout, and depression, who is being seen 02/09/2021 for the evaluation of A fib  RVR at the request of Dr. Tamala Julian.    Assessment & Plan    Paroxysmal A fib with RVR History of SVT -Presented with generalized weakness and fatigue -EKG admission revealed A. fib RVR 159 bpm -Last echo from 6/21 with EF 70 to 75%, no RWMA, moderate AS - TSH and Mag WNL, repleted for hypokalemia, possible exacerbated by cellulitis - discontinued diltiazem drip overnight, increased metoprolol XL from 25mg  to 50mg , rate control improving, AM dosing pending today  - Resume nightly BIPAP for OSA - Continue anticoagulation with Eliquis for CHADSVAS score of 4   Chronic diastolic heart failure -Clinically euvolemic -Chest x-ray without acute finding -Continue home regimen GDMT with torsemide, metoprolol, spironolactone and farxiga    Nonobstructive CAD -Denied chest pain -Continue medical therapy with metoprolol, no ASA due to Eliquis use, statin; hold Imdur to allow BP room for metoprolol titration    Moderate aortic stenosis -Outpatient follow-up with cardiology for surveillance   Hypertension -Blood pressure controlled, continue torsemide, metoprolol, spironolactone   Hyperlipidemia -Continue statin   Bilateral lower extremity cellulitis -WBC normalized, BLE erythema improving, managed per IM   Left-sided weakness -Negative for acute CVA, neurology consulted, pending cervical spine lesion  work-up   OSA: on HS BiPAP Type 2 diabetes: A1C 8%  Anxiety Gout Seizure  -Managed per IM       For questions or updates, please contact [facility] Please consult [email or web address] for contact info under        Signed, Margie Billet, NP  02/10/2021, 7:56 AM   As above, patient seen and examined.  He denies dyspnea, chest pain or palpitations.  He remains in atrial fibrillation.  Heart rate has been mildly elevated.  Add Cardizem 30 mg every 6 hours.  Titrate medications for rate control.  Continue apixaban.  Can consider cardioversion in the future once cellulitis improves.  Follow-up echocardiogram shows normal LV function with moderate aortic stenosis. Kirk Ruths, MD

## 2021-02-11 DIAGNOSIS — I10 Essential (primary) hypertension: Secondary | ICD-10-CM

## 2021-02-11 LAB — GLUCOSE, CAPILLARY
Glucose-Capillary: 190 mg/dL — ABNORMAL HIGH (ref 70–99)
Glucose-Capillary: 169 mg/dL — ABNORMAL HIGH (ref 70–99)
Glucose-Capillary: 145 mg/dL — ABNORMAL HIGH (ref 70–99)
Glucose-Capillary: 167 mg/dL — ABNORMAL HIGH (ref 70–99)
Glucose-Capillary: 175 mg/dL — ABNORMAL HIGH (ref 70–99)
Glucose-Capillary: 167 mg/dL — ABNORMAL HIGH (ref 70–99)
Glucose-Capillary: 148 mg/dL — ABNORMAL HIGH (ref 70–99)

## 2021-02-11 LAB — BASIC METABOLIC PANEL
Anion gap: 11 (ref 5–15)
BUN: 18 mg/dL (ref 8–23)
CO2: 28 mmol/L (ref 22–32)
Calcium: 8.7 mg/dL — ABNORMAL LOW (ref 8.9–10.3)
Chloride: 96 mmol/L — ABNORMAL LOW (ref 98–111)
Creatinine, Ser: 0.93 mg/dL (ref 0.61–1.24)
GFR, Estimated: >60 mL/min (ref >60)
Glucose, Bld: 141 mg/dL — ABNORMAL HIGH (ref 70–99)
Potassium: 3.7 mmol/L (ref 3.5–5.1)
Sodium: 135 mmol/L (ref 135–145)

## 2021-02-11 LAB — CBC
HCT: 42.1 % (ref 39.0–52.0)
Hemoglobin: 13.4 g/dL (ref 13.0–17.0)
MCH: 29.5 pg (ref 26.0–34.0)
MCHC: 31.8 g/dL (ref 30.0–36.0)
MCV: 92.5 fL (ref 80.0–100.0)
Platelets: 310 10*3/uL (ref 150–400)
RBC: 4.55 MIL/uL (ref 4.22–5.81)
RDW: 15.3 % (ref 11.5–15.5)
WBC: 10.4 10*3/uL (ref 4.0–10.5)
nRBC: 0 % (ref 0.0–0.2)

## 2021-02-11 LAB — MAGNESIUM: Magnesium: 2.2 mg/dL (ref 1.7–2.4)

## 2021-02-11 MED ORDER — DILTIAZEM HCL ER COATED BEADS 180 MG PO CP24: 180.0000 mg | ORAL_CAPSULE | Freq: Every day | ORAL | 2 refills | Status: DC

## 2021-02-11 MED ORDER — METOPROLOL SUCCINATE ER 50 MG PO TB24: 50.0000 mg | ORAL_TABLET | Freq: Every day | ORAL | 0 refills | Status: DC

## 2021-02-11 MED ORDER — DILTIAZEM HCL ER COATED BEADS 180 MG PO CP24
180.0000 mg | ORAL_CAPSULE | Freq: Every day | ORAL | Status: DC
  Administered 2021-02-11: 180 mg via ORAL
  Filled 2021-02-11: qty 1

## 2021-02-11 MED ORDER — CEFDINIR 300 MG PO CAPS: 300.0000 mg | ORAL_CAPSULE | Freq: Two times a day (BID) | ORAL | 0 refills | Status: AC

## 2021-02-11 NOTE — Plan of Care (Signed)
Follow up with cardiology APP arranged on 03/17/21. DC plan per IM.

## 2021-02-11 NOTE — Progress Notes (Addendum)
Progress Note  Patient Name: Lawrence [name] Date of Encounter: 02/11/2021  [facility] Cardiologist: Fransico Him, MD   Subjective   Patient states he is feeling well, he did not get OOB yesterday. He states he is not having any chest pain, SOB, and heart palpitation. He states his legs are less tender and red. He is hopeful for going home today.   Inpatient Medications    Scheduled Meds:  allopurinol  100 mg Oral BID   apixaban  5 mg Oral BID   ARIPiprazole  5 mg Oral QHS   atorvastatin  40 mg Oral QPM   busPIRone  5 mg Oral TID   diltiazem  180 mg Oral Daily   ferrous sulfate  325 mg Oral Q breakfast   fluticasone  2 spray Each Nare Daily   insulin aspart  0-5 Units Subcutaneous QHS   insulin aspart  0-9 Units Subcutaneous TID WC   insulin aspart  4 Units Subcutaneous TID WC   insulin glargine  12 Units Subcutaneous Daily   loratadine  10 mg Oral Daily   metoprolol succinate  50 mg Oral Daily   pantoprazole  40 mg Oral Daily   potassium chloride  40 mEq Oral Daily   pregabalin  100 mg Oral TID   silver sulfADIAZINE  1 application Topical Daily   spironolactone  25 mg Oral Daily   temazepam  15 mg Oral QHS   torsemide  20 mg Oral q1800   torsemide  40 mg Oral Daily   traZODone  200 mg Oral QHS   venlafaxine XR  225 mg Oral Q breakfast   Continuous Infusions:  cefTRIAXone (ROCEPHIN)  IV 2 g (02/10/21 1720)   PRN Meds: acetaminophen **OR** acetaminophen, metoprolol tartrate, morphine injection, ondansetron **OR** ondansetron (ZOFRAN) IV, oxyCODONE   Vital Signs    Vitals:   02/10/21 2057 02/11/21 0033 02/11/21 0143 02/11/21 0543  BP: 122/90  117/77 121/75  Pulse: 85 (!) 47 81 83  Resp: 18  17 18   Temp: 97.8 F (36.6 C)  97.6 F (36.4 C) 97.9 F (36.6 C)  TempSrc: Oral  Oral Oral  SpO2: 94% 92% 90% 92%  Weight:      Height:        Intake/Output Summary (Last 24 hours) at 02/11/2021 0821 Last data filed at 02/11/2021 0557 Gross per 24 hour  Intake 540 ml   Output 2625 ml  Net -2085 ml   Last 3 Weights 02/08/2021 01/25/2021 01/07/2021  Weight (lbs) 386 lb 0.4 oz 388 lb 389 lb  Weight (kg) 175.1 kg 175.996 kg 176.449 kg  Some encounter information is confidential and restricted. Go to Review Flowsheets activity to see all data.      Telemetry    Converted to NSR overnight from A fib- Personally Reviewed  ECG    N/A for this AM - Personally Reviewed  Physical Exam   GEN: No acute distress.   Neck: No JVD Cardiac: RRR, no murmurs, rubs, or gallops.  Respiratory: Clear to auscultation bilaterally. On 2LNC pox 94% GI: Soft, nontender, non-distended  MS: BLE chronic lymphedema with erythema and tenderness; No deformity. Neuro:  Nonfocal  Psych: Normal affect   Labs    High Sensitivity Troponin:  No results for input(s): TROPONINIHS in the last 720 hours.    Chemistry Recent Labs  Lab 02/09/21 0244 02/10/21 0401 02/11/21 0445  NA 138 135 135  K 3.4* 3.6 3.7  CL 100 99 96*  CO2 31 27  28  GLUCOSE 160* 175* 141*  BUN 16 16 18   CREATININE 0.89 1.03 0.93  CALCIUM 8.8* 9.0 8.7*  GFRNONAA >60 >60 >60  ANIONGAP 7 9 11      Hematology Recent Labs  Lab 02/09/21 0244 02/10/21 0401 02/11/21 0445  WBC 11.9* 10.5 10.4  RBC 4.56 4.79 4.55  HGB 13.4 14.1 13.4  HCT 41.3 43.3 42.1  MCV 90.6 90.4 92.5  MCH 29.4 29.4 29.5  MCHC 32.4 32.6 31.8  RDW 15.3 15.4 15.3  PLT 314 304 310    BNPNo results for input(s): BNP, PROBNP in the last 168 hours.   DDimer No results for input(s): DDIMER in the last 168 hours.   Radiology    MR CERVICAL SPINE WO CONTRAST  Result Date: 02/09/2021 CLINICAL DATA:  Provided history: Spinal stenosis, cervical spine. Additional history provided: Patient with right upper extremity shaking, concern for partial seizure, lethargy, left lower extremity weakness. EXAM: MRI CERVICAL SPINE WITHOUT CONTRAST TECHNIQUE: Multiplanar, multisequence MR imaging of the cervical spine was performed. No intravenous  contrast was administered. COMPARISON:  CT angiogram neck 01/25/2021 FINDINGS: The examination is intermittently motion degraded, limiting evaluation. Most notably, there is moderate/severe motion degradation of the axial T2 weighted sequences. Alignment: Mild cervical dextrocurvature. Straightening of the expected cervical lordosis. Trace C3-C4 and C4-C5 grade 1 anterolisthesis. Trace C5-C6 grade 1 retrolisthesis. Vertebrae: Vertebral body height is maintained. Facet joint ankylosis on the left at C2-C3. Multilevel degenerative endplate irregularity. Mild degenerative endplate edema at J9-E1, C6-C7 and C7-T1. Multilevel ventral osteophytes, most prominent at C5-C6. Cord: Within the limitations of motion degradation, no spinal cord signal abnormality is identified. Posterior Fossa, vertebral arteries, paraspinal tissues: No acute finding within included portions of the posterior fossa. Flow voids preserved within the imaged cervical vertebral arteries. Paraspinal soft tissues unremarkable. Disc levels: Moderate disc degeneration at C5-C6, C6-C7 and C7-T1. Mild disc degeneration at the remaining levels. Borderline congenitally narrow cervical spinal canal. C2-C3: Shallow disc bulge. Left-sided disc osteophyte ridge/uncinate hypertrophy. Facet arthrosis (predominantly on the left). Mild partial effacement of the ventral thecal sac without significant spinal cord mass effect. Severe left neural foraminal narrowing. C3-C4: Trace grade 1 anterolisthesis. Disc uncovering with disc bulge and bilateral disc osteophyte ridge/uncinate hypertrophy. Facet arthrosis. Mild ligamentum flavum hypertrophy. Mild relative spinal canal narrowing with likely contact upon the ventral spinal cord. Severe bilateral neural foraminal narrowing. C4-C5: Trace grade 1 anterolisthesis. Disc uncovering with disc bulge and bilateral uncovertebral hypertrophy. Moderate/advanced facet arthrosis. Mild ligamentum flavum hypertrophy. Mild spinal canal  stenosis with likely contact upon the ventral spinal cord. Motion degradation limits evaluation of the neural foramina. Bilateral neural foraminal narrowing (moderate/severe right, moderate left). C5-C6: Trace grade 1 retrolisthesis. Posterior disc osteophyte complex with bilateral disc osteophyte ridge/uncinate hypertrophy. Facet arthrosis. Minimal ligamentum flavum hypertrophy. Mild spinal canal stenosis with contact upon the ventral spinal cord. Severe bilateral neural foraminal narrowing. C6-C7: Posterior disc osteophyte complex with bilateral disc osteophyte ridge/uncinate hypertrophy. Facet arthrosis (greater on the left). Mild relative spinal canal narrowing without significant spinal cord mass effect. Severe bilateral neural foraminal narrowing. C7-T1: Shallow disc bulge with mild endplate spurring/osteophytic ridging. No significant spinal canal stenosis. Mild relative right neural foraminal narrowing. IMPRESSION: Motion degraded examination, as described and limiting evaluation. Cervical spondylosis, as outlined. No more than mild spinal canal stenosis. Multilevel neural foraminal narrowing, as detailed and greatest on the left at C2-C3 (severe), bilaterally at C3-C4 (severe), on the right at C4-C5 (moderate/severe), bilaterally at C5-C6 (severe) and bilaterally at C6-C7 (severe).  Additional sites of mild and moderate foraminal stenosis, as above. Mild degenerative endplate edema at Q2-I2, C6-C7 and C7-T1. Mild cervical dextrocurvature. Straightening of the expected cervical lordosis. Trace grade 1 spondylolisthesis at C3-C4, C4-C5 and C5-C6. Electronically Signed   By: Kellie Simmering DO   On: 02/09/2021 12:58    Cardiac Studies   Echo from 01/26/21:    1. Limited study for LV function; limited doppler and not all views  obtained.   2. Left ventricular ejection fraction, by estimation, is 70 to 75%. The  left ventricle has hyperdynamic function. The left ventricle has no  regional wall motion  abnormalities.   3. Right ventricular systolic function is normal. The right ventricular  size is normal.   4. The mitral valve is normal in structure. No evidence of mitral  stenosis.   5. The aortic valve has an indeterminant number of cusps. Moderate aortic  valve stenosis.   Myoview 11/26/20:   Nuclear stress EF: 55%. The left ventricular ejection fraction is normal (55-65%). There was no ST segment deviation noted during stress. The study is normal. This is a low risk study.     [facility] on 01/17/2018:   There is hyperdynamic left ventricular systolic function. LV end diastolic pressure is normal. The left ventricular ejection fraction is greater than 65% by visual estimate. There is no mitral valve regurgitation. There is no aortic valve stenosis. Prox LAD to Mid LAD lesion is 40% stenosed. Prox Cx to Mid Cx lesion is 20% stenosed. Ost 2nd Mrg lesion is 20% stenosed.   Angiographically minimal coronary disease with ectatic RCA and circumflex. Normal LVEDP and normal LV function/hyperdynamic.     Plan: Return to nursing unit for post catheterization care & TR band removal. Would be okay for discharge from cardiac standpoint after bedrest post TR band removal.   Defer further plans to primary team and consulting cardiologist.    Patient Profile     Leory Allinson is a 65 y.o. male with a hx of  hypertension, hyperlipidemia, DM type II, COPD,  paroxysmal atrial fibrillation on Eliquis, diastolic CHF( last EF 97-98%), moderate aortic stenosis, seizure disorder, OSA on BiPAP, gout, and depression, who is being seen 02/09/2021 for the evaluation of A fib  RVR at the request of Dr. Tamala Julian.    Assessment & Plan    Paroxysmal A fib with RVR History of SVT -Presented with generalized weakness and fatigue -EKG admission revealed A. fib RVR 159 bpm -Last echo from 6/21 with EF 70 to 75%, no RWMA, moderate AS - TSH and Mag WNL, repleted for hypokalemia, possible exacerbated by  cellulitis - increased metoprolol XL from 25mg  to 50mg , add Cardizem 30mg  q6h, now converted to NSR, will discharge on metoprolol XL 50mg  daily and Cardizem XL 180mg  daily  - discussed importance of compliance with nightly BIPAP for OSA - Continue anticoagulation with Eliquis for CHADSVAS score of 4 - follow up with cardiology will be arranged    Chronic diastolic heart failure -Clinically euvolemic -Chest x-ray without acute finding -Continue home regimen GDMT with torsemide, metoprolol, spironolactone and farxiga    Nonobstructive CAD -Denied chest pain -Continue medical therapy with metoprolol, no ASA due to Eliquis use, statin; Imdur has been held to allow BP room for metoprolol titration, will discontinue Imdur at discharge with above medication change to avoid hypotension    Moderate aortic stenosis -Outpatient follow-up with cardiology for surveillance   Hypertension -Blood pressure controlled, continue torsemide, metoprolol, spironolactone, and add Cardizem as  above    Hyperlipidemia -Continue statin   Bilateral lower extremity cellulitis -WBC normalized, BLE erythema improving, managed per IM   Left-sided weakness -Negative for acute CVA, neurology consulted, pending cervical spine lesion work-up   OSA: on HS BiPAP Type 2 diabetes: A1C 8%  Anxiety Gout Seizure  -Managed per IM       For questions or updates, please contact [facility] Please consult [email or web address] for contact info under        Signed, Margie Billet, NP  02/11/2021, 8:21 AM   As above, patient seen and examined.  He denies dyspnea or chest pain.  He has converted to sinus rhythm.  We will change Cardizem to CD1 180 mg daily and continue Toprol.  Continue apixaban 5 mg twice daily.  If he has more frequent episodes in the future we will consider addition of antiarrhythmic.  Other issues per primary care.  Patient can be discharged from a cardiac standpoint.  Follow-up with APP in 4 to 6 weeks.   Follow-up Dr. Radford Pax 3 to 4 months.  Continue present medications at discharge.  We will sign off.  Please call with questions.  Kirk Ruths, MD

## 2021-02-11 NOTE — Plan of Care (Signed)

## 2021-02-11 NOTE — Discharge Summary (Signed)
Physician Discharge Summary  Lawrence [name] EGB:[id number] DOB: 1955-11-01 DOA: 02/08/2021  PCP: [name], PA-C  Admit date: 02/08/2021 Discharge date: 02/11/2021  Admitted From: Home Disposition: Home  Recommendations for Outpatient Follow-up:  Follow up with PCP in 1-2 weeks Follow-up with cardiology as scheduled on 03/17/2021 Follow-up with cardiology, Dr. Radford Pax in 3-4 months Started on Cardizem 180 mg p.o. daily and increase metoprolol succinate to 50 mg p.o. daily for A. fib with RVR Continue cefdinir for treatment of lower extremity cellulitis Consider repeat sleep study for assessment of nocturnal need of oxygen bled into home BiPAP.  Home Health: RN/PT Equipment/Devices: None  Discharge Condition: Stable CODE STATUS: DNR Diet recommendation: Heart healthy/consistent carbohydrate diet  History of present illness:  Lawrence [name] is a 65 year old male with past medical history significant for essential hypertension, type 2 diabetes mellitus, COPD, diastolic congestive heart failure, moderate aortic stenosis, seizure disorder, OSA on home BiPAP, hyperlipidemia, gout, and depression presents with complaints of malaise which started yesterday.  He normally feels like this when he goes into atrial fibrillation.  That evening he started having some left lower extremity weakness, but symptoms persisted today and he still felt no better.  Patient was noted to be more lethargic than usual with reports of intermittent staring off spells.  His girlfriend notes that he has had increasing redness of his lower extremities over the last few days.  Redness is worse on the right leg as opposed to the left.  He had last been treated for cellulitis 3-4 months ago.  Other associated symptoms include tingling sensations of both legs.  He reports that his weight is actually down to 382 pounds and his efforts to try and lose weight denies any fever, chest pain, shortness of breath, nausea, vomiting,  diarrhea, or seizure-like activity symptoms.     Recently been hospitalized from 6/20-6/21 with transient episode of left-sided weakness which MRI of the brain was negative for any acute abnormality.  Symptoms were thought possibly related to seizure-like activity, but EEG was negative.  Patient was recommended to continue on Lyrica 100 mg 3 times daily.   In the emergency department patient was seen to be in atrial fibrillation with heart rates elevated up to 150s, and all other vital signs maintained.  Labs significant for WBC 12.2, potassium 4.1, magnesium 2.1, and glucose 207.  CT scan of the brain showed no acute abnormalities.  Patient had been given 1 L normal saline IV fluids and started on Cardizem drip.  Metoprolol IV which had initially been ordered was never given due to patient's low blood pressures. [facility] called to admit.  Hospital course:  Atrial fibrillation with RVR Patient presents in A. fib with RVR with heart rates into the 150s.  Patient was initially started on a Cardizem drip and now transitioned to oral medications.  TTE 01/26/21 with LVEF 70-75%, no LV regional wall motion abnormalities, moderate aortic valve stenosis.  Cardiology was consulted and followed during hospital course.  Patient's metoprolol succinate was increased to 50 mg p.o. daily.  Patient was started on Cardizem drip initially and transition to Cardizem CD 180 mg p.o. daily.  Continue apixaban 5 mg p.o. twice daily.  Outpatient follow-up with cardiology scheduled on 03/17/2021 with APP, will need follow-up with Dr. Radford Pax in 3-4 months.   Left-sided weakness: During prior hospitalization in June patient had similar left-sided weakness.  Symptoms thought to be secondary to a TIA at that time his MRI was negative.  However, it appears that it  was also brought up the possibility of partial seizures as the cause of patient's symptoms.  CT head without contrast with no acute intercranial abnormalities, mild chronic  microvascular ischemic change.  MR brain without contrast with no evidence of recent infarction, hemorrhage or mass, no significant change since recent prior study.  CT C-spine without contrast Motion degraded limiting evaluation, mild spinal canal stenosis with multilevel neural foraminal narrowing greatest on the left C2-C3, bilaterally C3-4, right C4-5 and bilaterally at C5-6 and trace grade 1 spondylolisthesis at C3-4, C4-5, and C5-6. Neurology recommends outpatient follow-up with EMG with NCS, ambulatory referral placed to neurology for EMG with nerve conduction studies.   Cellulitis of the lower extremities: Patient noted to have increasing redness of the bilateral lower extremities over the last couple of days.  Increased warmth to palpation noted.  Patient was treated with 3 days of IV ceftriaxone while inpatient, will continue cefdinir 300 mg p.o. twice daily to complete 10-day course.   Diabetes mellitus type II: Home insulin regimen includes Farxiga 10 mg daily with breakfast, Trulicity 4.5 mg weekly, Lantus 12 units every morning, and metformin 1000 mg twice daily.   Chronic diastolic congestive heart failure: Compensated TTE 01/26/21 with LVEF 70-75%, no LV regional wall motion abnormalities, moderate aortic valve stenosis. Continue torsemide and increased dose of beta-blocker as above.   Seizure disorder: Patient had previously been observed having possible seizures during hospitalization in March of this year and was discharged home on Keppra.  However, patient did not continue after prescription ran out.  During last hospitalization in June neurology evaluated and recommended continue Lyrica 3 times daily.  It was brought up the possibility of increasing his Lyrica dose during last hospitalization 150 mg 3 times daily.  However this was not done as symptoms were thought more so secondary to a TIA.  Continue Lyrica 100 mg p.o. 3 times daily   Essential hypertension: Continue isosorbide  mononitrate, spironolactone, Metoprolol succinate, torsemide and now on Cardizem as above.    COPD: Not oxygen dependent, albuterol MDI as needed.   Depression and anxiety Abilify 5 mg p.o. nightly, BuSpar 81m PO TID, Effexor to 25 mg p.o. daily   Hyperlipidemia Continue atorvastatin 40 mg p.o. daily   OSA Continue BiPAP nightly; consider repeat outpatient sleep study, may need nocturnal oxygen bled into BiPAP at night.   GERD: Continue PPI   Insomnia: Trazodone 200 mg p.o. nightly   Iron deficiency anemia Ferrous sulfate 325 mg p.o. daily   Morbid obesity:  Body mass index is 49.56 kg/m.  Discussed with patient needs for aggressive lifestyle changes/weight loss as this complicates all facets of care.  Outpatient follow-up with PCP.  May benefit from bariatric evaluation outpatient.  Discharge Diagnoses:  Active Problems:   OSA (obstructive sleep apnea)   Morbid obesity (HCC)   Essential hypertension, benign   Chronic diastolic CHF (congestive heart failure) (HCC)   Bilateral lower leg cellulitis   Do not resuscitate status   Type 2 diabetes mellitus with diabetic polyneuropathy, with long-term current use of insulin (HCC)   COPD (chronic obstructive pulmonary disease) (HCC)   Atrial fibrillation with rapid ventricular response (HCC)   Left-sided weakness    Discharge Instructions  Discharge Instructions     (HEART FAILURE PATIENTS) Call MD:  Anytime you have any of the following symptoms: 1) 3 pound weight gain in 24 hours or 5 pounds in 1 week 2) shortness of breath, with or without a dry hacking cough 3) swelling in  the hands, feet or stomach 4) if you have to sleep on extra pillows at night in order to breathe.   Complete by: As directed    Amb referral to AFIB Clinic   Complete by: As directed    Ambulatory referral to Neurology   Complete by: As directed    An appointment is requested in approximately: 4 weeks left arm weakness, inpatient neurology recommends  EMG with nerve conduction studies.   Call MD for:  difficulty breathing, headache or visual disturbances   Complete by: As directed    Call MD for:  extreme fatigue   Complete by: As directed    Call MD for:  persistant dizziness or light-headedness   Complete by: As directed    Call MD for:  persistant nausea and vomiting   Complete by: As directed    Call MD for:  severe uncontrolled pain   Complete by: As directed    Call MD for:  temperature >100.4   Complete by: As directed    Diet - low sodium heart healthy   Complete by: As directed    Increase activity slowly   Complete by: As directed       Allergies as of 02/11/2021       Reactions   Vancomycin Other (See Comments)   "Red Man Syndrome" 02/02/17: possible cause for rash under both arms   Niacin Other (See Comments)   Niacin And Related Other (See Comments)   Red man syndrome   Tubersol [tuberculin] Other (See Comments)   Reaction unknown   Doxycycline Rash, Other (See Comments)        Medication List     TAKE these medications    Accu-Chek FastClix Lancets Misc 1 each by Other route in the morning and at bedtime.   acetaminophen 325 MG tablet Commonly known as: TYLENOL Take 2 tablets (650 mg total) by mouth every 4 (four) hours as needed for mild pain (or temp > 37.5 C (99.5 F)).   albuterol 108 (90 Base) MCG/ACT inhaler Commonly known as: VENTOLIN HFA Inhale 2 puffs into the lungs every 6 (six) hours as needed for wheezing or shortness of breath.   Alcohol Prep 70 % Pads 1 each by Other route in the morning, at noon, and at bedtime.   allopurinol 100 MG tablet Commonly known as: ZYLOPRIM Take 100 mg by mouth 2 (two) times daily.   apixaban 5 MG Tabs tablet Commonly known as: ELIQUIS Take 1 tablet (5 mg total) by mouth 2 (two) times daily.   ARIPiprazole 5 MG tablet Commonly known as: ABILIFY Take 1 tablet (5 mg total) by mouth at bedtime.   atorvastatin 40 MG tablet Commonly known as:  LIPITOR TAKE ONE TABLET BY MOUTH DAILY AT 6PM What changed:  how much to take how to take this when to take this additional instructions   B-D ULTRAFINE III SHORT PEN 31G X 8 MM Misc Generic drug: Insulin Pen Needle 1 each by Other route See admin instructions. Use to inject insulin subcutaneously at bedtime as directed.   blood glucose meter kit and supplies Dispense based on patient and insurance preference. Use up to four times daily as directed. (FOR ICD-10 E10.9, E11.9).   busPIRone 5 MG tablet Commonly known as: BUSPAR Take 1 tablet (5 mg total) by mouth 3 (three) times daily.   cefdinir 300 MG capsule Commonly known as: OMNICEF Take 1 capsule (300 mg total) by mouth 2 (two) times daily for 7 days.  cetirizine 10 MG tablet Commonly known as: ZYRTEC Take 10 mg by mouth at bedtime.   dapagliflozin propanediol 10 MG Tabs tablet Commonly known as: Farxiga Take 1 tablet (10 mg total) by mouth daily before breakfast.   diltiazem 180 MG 24 hr capsule Commonly known as: CARDIZEM CD Take 1 capsule (180 mg total) by mouth daily. Start taking on: February 12, 2021   ferrous sulfate 324 (65 Fe) MG Tbec Take 324 mg by mouth daily with breakfast.   fluticasone 50 MCG/ACT nasal spray Commonly known as: FLONASE Place 2 sprays into both nostrils daily.   isosorbide mononitrate 30 MG 24 hr tablet Commonly known as: IMDUR Take 1 tablet (30 mg total) by mouth daily.   Lantus SoloStar 100 UNIT/ML Solostar Pen Generic drug: insulin glargine Inject 12 Units into the skin every morning.   metFORMIN 500 MG 24 hr tablet Commonly known as: GLUCOPHAGE-XR Take 1,000 mg by mouth 2 (two) times daily.   metoprolol succinate 50 MG 24 hr tablet Commonly known as: TOPROL-XL Take 1 tablet (50 mg total) by mouth daily. Take with or immediately following a meal. Start taking on: February 12, 2021 What changed:  medication strength how much to take additional instructions   omeprazole 20 MG  capsule Commonly known as: PRILOSEC Take 20 mg by mouth every morning.   oxyCODONE 5 MG immediate release tablet Commonly known as: Oxy IR/ROXICODONE Take 5 mg by mouth every 8 (eight) hours as needed for severe pain.   potassium chloride SA 20 MEQ tablet Commonly known as: KLOR-CON Take 2 tablets (40 mEq total) by mouth daily.   prazosin 2 MG capsule Commonly known as: MINIPRESS Take 2 capsules (4 mg total) by mouth at bedtime.   pregabalin 100 MG capsule Commonly known as: LYRICA Take 100 mg by mouth in the morning, at noon, and at bedtime.   silver sulfADIAZINE 1 % cream Commonly known as: SILVADENE Apply 1 application topically See admin instructions. Apply as directed once a day as directed to bilateral legs   spironolactone 25 MG tablet Commonly known as: ALDACTONE Take 1 tablet (25 mg total) by mouth daily.   temazepam 15 MG capsule Commonly known as: RESTORIL Take 1 capsule (15 mg total) by mouth at bedtime.   torsemide 20 MG tablet Commonly known as: Demadex Take 2 tablets (40 mg total) in the morning and 1 tablet (20 mg total) in the evening.   traZODone 100 MG tablet Commonly known as: DESYREL Take 2 tablets (200 mg total) by mouth at bedtime.   Trulicity 4.5 AS/5.0NL Sopn Generic drug: Dulaglutide Inject 4.5 mg as directed once a week. What changed: additional instructions   venlafaxine XR 75 MG 24 hr capsule Commonly known as: EFFEXOR-XR Take 3 capsules (225 mg total) by mouth daily with breakfast.   Vitamin D (Ergocalciferol) 1.25 MG (50000 UNIT) Caps capsule Commonly known as: DRISDOL Take 50,000 Units by mouth every 7 (seven) days. Mondays               Durable Medical Equipment  (From admission, onward)           Start     Ordered   02/10/21 1812  For home use only DME Bedside commode  Once       Comments: Bariatric  Question:  Patient needs a bedside commode to treat with the following condition  Answer:  Gait abnormality    02/10/21 1811            Follow-up  Information     Isaiah Serge, NP Follow up on 03/17/2021.   Specialties: Cardiology, Radiology Why: Please arrive at 1:45 PM for your post hospital follow up appointment with cardiology Contact information: [place] [address] [phone]         [facility] Follow up.   Specialty: Home Health Services Why: Registered Nurse, and Physical Therapy-office to call with a visit time. Contact information: [address] [place] [address] [phone]         [name], PA-C. Schedule an appointment as soon as possible for a visit in 1 week(s).   Specialty: Family Medicine Contact information: [place] [address] [phone]         Sueanne Margarita, MD. Schedule an appointment as soon as possible for a visit in 3 month(s).   Specialty: Cardiology Contact information: [address] [place] [address] [phone]                Allergies  Allergen Reactions   Vancomycin Other (See Comments)    "Red Man Syndrome" 02/02/17: possible cause for rash under both arms   Niacin Other (See Comments)   Niacin And Related Other (See Comments)    Red man syndrome   Tubersol [Tuberculin] Other (See Comments)    Reaction unknown   Doxycycline Rash and Other (See Comments)    Consultations: Cardiology   Procedures/Studies: DG Chest 1 View  Result Date: 01/25/2021 CLINICAL DATA:  65 year old male with concern for pneumonia. EXAM: CHEST  1 VIEW COMPARISON:  Chest radiograph dated 11/13/2020. FINDINGS: Minimal bibasilar atelectasis. No focal consolidation, pleural effusion, pneumothorax. The cardiomegaly. No acute osseous pathology. IMPRESSION: No active cardiopulmonary disease. Electronically Signed   By: Anner Crete M.D.   On: 01/25/2021 20:54   CT Head Wo Contrast  Result Date: 02/08/2021 CLINICAL DATA:  PT BIB EMS from  home. Pt has a hx of afib and usually can convert himself out of it. Pts HR is 120-160. Pt is axox4. Pt was put on 2L by EMS. EXAM: CT HEAD WITHOUT CONTRAST TECHNIQUE: Contiguous axial images were obtained from the base of the skull through the vertex without intravenous contrast. COMPARISON:  01/25/2021 FINDINGS: Brain: No evidence of acute infarction, hemorrhage, hydrocephalus, extra-axial collection or mass lesion/mass effect. Mild periventricular white matter hypoattenuation noted consistent with chronic microvascular ischemic change. Vascular: No hyperdense vessel or unexpected calcification. Skull: Normal. Negative for fracture or focal lesion. Sinuses/Orbits: Small amount of dependent fluid in the left sphenoid sinus. Mild scattered areas of ethmoid sinus mucosal thickening. Mild polypoid mucosal thickening at the floor of the right maxillary sinus. Other: None. IMPRESSION: 1. No acute intracranial abnormalities. 2. Mild chronic microvascular ischemic change. Electronically Signed   By: Lajean Manes M.D.   On: 02/08/2021 13:05   MR BRAIN WO CONTRAST  Result Date: 02/08/2021 CLINICAL DATA:  Left lower extremity weakness EXAM: MRI HEAD WITHOUT CONTRAST TECHNIQUE: Multiplanar, multiecho pulse sequences of the brain and surrounding structures were obtained without intravenous contrast. COMPARISON:  01/25/2021 FINDINGS: Brain: There is no acute infarction or intracranial hemorrhage. There is no intracranial mass, mass effect, or edema. There is no hydrocephalus or extra-axial fluid collection. Ventricles and sulci are stable in size and configuration. Patchy foci of T2 hyperintensity in the supratentorial white matter are nonspecific but may reflect stable chronic microvascular ischemic changes. Vascular: Major vessel flow voids at the skull base are preserved.  Skull and upper cervical spine: Normal marrow signal is preserved. Sinuses/Orbits: Patchy mucosal thickening.  Orbits are unremarkable. Other: Sella is  unremarkable.  Mastoid air cells are clear. IMPRESSION: No evidence of recent infarction, hemorrhage, or mass. No significant change since recent prior study. Electronically Signed   By: Macy Mis M.D.   On: 02/08/2021 18:56   MR BRAIN WO CONTRAST  Result Date: 01/26/2021 CLINICAL DATA:  Initial evaluation for acute stroke, slurred speech, intermittent headache. EXAM: MRI HEAD WITHOUT CONTRAST TECHNIQUE: Multiplanar, multiecho pulse sequences of the brain and surrounding structures were obtained without intravenous contrast. COMPARISON:  Prior CTs from earlier the same day. FINDINGS: Brain: Examination mildly degraded by motion artifact. Cerebral volume within normal limits for age. No significant cerebral white matter disease. No abnormal foci of restricted diffusion to suggest acute or subacute ischemia. Gray-white matter differentiation maintained. No encephalomalacia to suggest chronic cortical infarction. No foci of susceptibility artifact to suggest acute or chronic intracranial hemorrhage. No mass lesion, midline shift or mass effect. No hydrocephalus or extra-axial fluid collection. Pituitary gland suprasellar region normal. Midline structures intact. Vascular: Major intracranial vascular flow voids are well maintained. Skull and upper cervical spine: Craniocervical junction within normal limits. Bone marrow signal intensity normal. No scalp soft tissue abnormality. Sinuses/Orbits: Globes and orbital soft tissues demonstrate no acute finding. Mild scattered mucosal thickening noted within the ethmoidal air cells and maxillary sinuses. No significant mastoid effusion. Inner ear structures grossly normal. Other: None. IMPRESSION: Negative brain MRI for age. No acute intracranial abnormality identified. Electronically Signed   By: Jeannine Boga M.D.   On: 01/26/2021 00:46   MR CERVICAL SPINE WO CONTRAST  Result Date: 02/09/2021 CLINICAL DATA:  Provided history: Spinal stenosis, cervical  spine. Additional history provided: Patient with right upper extremity shaking, concern for partial seizure, lethargy, left lower extremity weakness. EXAM: MRI CERVICAL SPINE WITHOUT CONTRAST TECHNIQUE: Multiplanar, multisequence MR imaging of the cervical spine was performed. No intravenous contrast was administered. COMPARISON:  CT angiogram neck 01/25/2021 FINDINGS: The examination is intermittently motion degraded, limiting evaluation. Most notably, there is moderate/severe motion degradation of the axial T2 weighted sequences. Alignment: Mild cervical dextrocurvature. Straightening of the expected cervical lordosis. Trace C3-C4 and C4-C5 grade 1 anterolisthesis. Trace C5-C6 grade 1 retrolisthesis. Vertebrae: Vertebral body height is maintained. Facet joint ankylosis on the left at C2-C3. Multilevel degenerative endplate irregularity. Mild degenerative endplate edema at L9-J6, C6-C7 and C7-T1. Multilevel ventral osteophytes, most prominent at C5-C6. Cord: Within the limitations of motion degradation, no spinal cord signal abnormality is identified. Posterior Fossa, vertebral arteries, paraspinal tissues: No acute finding within included portions of the posterior fossa. Flow voids preserved within the imaged cervical vertebral arteries. Paraspinal soft tissues unremarkable. Disc levels: Moderate disc degeneration at C5-C6, C6-C7 and C7-T1. Mild disc degeneration at the remaining levels. Borderline congenitally narrow cervical spinal canal. C2-C3: Shallow disc bulge. Left-sided disc osteophyte ridge/uncinate hypertrophy. Facet arthrosis (predominantly on the left). Mild partial effacement of the ventral thecal sac without significant spinal cord mass effect. Severe left neural foraminal narrowing. C3-C4: Trace grade 1 anterolisthesis. Disc uncovering with disc bulge and bilateral disc osteophyte ridge/uncinate hypertrophy. Facet arthrosis. Mild ligamentum flavum hypertrophy. Mild relative spinal canal narrowing  with likely contact upon the ventral spinal cord. Severe bilateral neural foraminal narrowing. C4-C5: Trace grade 1 anterolisthesis. Disc uncovering with disc bulge and bilateral uncovertebral hypertrophy. Moderate/advanced facet arthrosis. Mild ligamentum flavum hypertrophy. Mild spinal canal stenosis with likely contact upon the ventral spinal cord. Motion degradation limits evaluation  of the neural foramina. Bilateral neural foraminal narrowing (moderate/severe right, moderate left). C5-C6: Trace grade 1 retrolisthesis. Posterior disc osteophyte complex with bilateral disc osteophyte ridge/uncinate hypertrophy. Facet arthrosis. Minimal ligamentum flavum hypertrophy. Mild spinal canal stenosis with contact upon the ventral spinal cord. Severe bilateral neural foraminal narrowing. C6-C7: Posterior disc osteophyte complex with bilateral disc osteophyte ridge/uncinate hypertrophy. Facet arthrosis (greater on the left). Mild relative spinal canal narrowing without significant spinal cord mass effect. Severe bilateral neural foraminal narrowing. C7-T1: Shallow disc bulge with mild endplate spurring/osteophytic ridging. No significant spinal canal stenosis. Mild relative right neural foraminal narrowing. IMPRESSION: Motion degraded examination, as described and limiting evaluation. Cervical spondylosis, as outlined. No more than mild spinal canal stenosis. Multilevel neural foraminal narrowing, as detailed and greatest on the left at C2-C3 (severe), bilaterally at C3-C4 (severe), on the right at C4-C5 (moderate/severe), bilaterally at C5-C6 (severe) and bilaterally at C6-C7 (severe). Additional sites of mild and moderate foraminal stenosis, as above. Mild degenerative endplate edema at I4-P3, C6-C7 and C7-T1. Mild cervical dextrocurvature. Straightening of the expected cervical lordosis. Trace grade 1 spondylolisthesis at C3-C4, C4-C5 and C5-C6. Electronically Signed   By: Kellie Simmering DO   On: 02/09/2021 12:58   CT  CEREBRAL PERFUSION W CONTRAST  Result Date: 01/25/2021 CLINICAL DATA:  Left leg weakness EXAM: CT ANGIOGRAPHY HEAD AND NECK CT PERFUSION BRAIN TECHNIQUE: Multidetector CT imaging of the head and neck was performed using the standard protocol during bolus administration of intravenous contrast. Multiplanar CT image reconstructions and MIPs were obtained to evaluate the vascular anatomy. Carotid stenosis measurements (when applicable) are obtained utilizing NASCET criteria, using the distal internal carotid diameter as the denominator. Multiphase CT imaging of the brain was performed following IV bolus contrast injection. Subsequent parametric perfusion maps were calculated using RAPID software. CONTRAST:  121m OMNIPAQUE IOHEXOL 350 MG/ML SOLN COMPARISON:  None. FINDINGS: CTA NECK Aortic arch: Great vessel origins are patent. There is no high-grade proximal subclavian artery stenosis. Right carotid system: Patent. Mild calcified plaque at the proximal ICA without stenosis. Retropharyngeal course. Left carotid system: Patent. Mild calcified plaque at the proximal ICA without stenosis. Retropharyngeal course. Vertebral arteries: Patent.  Left vertebral artery is dominant. Skeleton: Degenerative changes of the cervical spine. Other neck: Unremarkable. Upper chest: No apical lung mass. Review of the MIP images confirms the above findings CTA HEAD Anterior circulation: Intracranial internal carotid arteries are patent with calcified plaque but no significant stenosis. Anterior and middle cerebral arteries are patent. Posterior circulation: Intracranial vertebral arteries are patent. Basilar artery is patent. Posterior cerebral arteries are patent. Bilateral posterior communicating arteries are present. Venous sinuses: Patent as allowed by contrast bolus timing. Review of the MIP images confirms the above findings CT Brain Perfusion Findings: CBF (<30%) Volume: 059mPerfusion (Tmax>6.0s) volume: 58m43mismatch Volume: 58mL258mnfarction Location: None. IMPRESSION: No large vessel occlusion, hemodynamically significant stenosis, or evidence of dissection. Perfusion imaging demonstrates no evidence of core infarction or penumbra. Electronically Signed   By: PranMacy Mis.   On: 01/25/2021 19:09   DG CHEST PORT 1 VIEW  Result Date: 02/08/2021 CLINICAL DATA:  Afib with RVR. Hx of DM, asthma, COPD, CHF, HTN. Pt is a former smoker. EXAM: PORTABLE CHEST 1 VIEW COMPARISON:  Chest x-ray 01/25/2021, CT chest 09/09/2019 FINDINGS: The heart size and mediastinal contours are unchanged. Bibasilar streaky airspace opacities likely representing atelectasis. No focal consolidation. No pulmonary edema. No pleural effusion. No pneumothorax. No acute osseous abnormality. Query old healed posterior 6 rib fracture.  IMPRESSION: No active disease. Electronically Signed   By: Iven Finn M.D.   On: 02/08/2021 20:03   EEG adult  Result Date: 01/26/2021 Lora Havens, MD     01/26/2021 10:56 AM Patient Name: Lawrence [name] MRN: [id number] Epilepsy Attending: Lora Havens Referring Physician/Provider: Dr Inda Merlin Date: 01/06/2021 Duration: 33.31 mins Patient history: 65 year old male with waxing and waning dysarthria as well as left-sided weakness.  EEG to evaluate for seizures. Level of alertness: Awake, asleep AEDs during EEG study: Temazepam Technical aspects: This EEG study was done with scalp electrodes positioned according to the 10-20 International system of electrode placement. Electrical activity was acquired at a sampling rate of _0  and reviewed with a high frequency filter of _1  and a low frequency filter of _2 . EEG data were recorded continuously and digitally stored. Description: The posterior dominant rhythm consists of _3  activity of moderate voltage (25-35 uV) seen predominantly in posterior head regions, symmetric and reactive to eye opening and eye closing. Sleep was characterized by vertex waves, sleep spindles  (12 to 14 Hz), maximal frontocentral region. Hyperventilation and photic stimulation were not performed.   IMPRESSION: This study is within normal limits. No seizures or epileptiform discharges were seen throughout the recording. Lora Havens   ECHOCARDIOGRAM COMPLETE  Result Date: 01/21/2021    ECHOCARDIOGRAM REPORT   Patient Name:   Lawrence [name] Date of Exam: 01/21/2021 Medical Rec #:  [id number]      Height:       74.0 in Accession #:    [id number]     Weight:       389.0 lb Date of Birth:  [birth year]      BSA:          2.884 m Patient Age:    89 years       BP:           126/64 mmHg Patient Gender: M              HR:           105 bpm. Exam Location:  [facility] Procedure: 2D Echo, Cardiac Doppler, Color Doppler and Intracardiac            Opacification Agent Indications:    R07.9 Chest pain  History:        Patient has prior history of Echocardiogram examinations, most                 recent 10/17/2020. Risk Factors:Hypertension, Diabetes and Morbid                 Obesity. Hypercholesterolemia. Obstructive sleep apnea-BiPAP.                 Bilateral lower extremity edema. Tachycardia. Chest pain. COPD.  Sonographer:    Wilford Sports Rodgers-Jones RDCS Referring Phys: [place]  1. Left ventricular ejection fraction, by estimation, is 60 to 65%. The left ventricle has normal function. The left ventricle has no regional wall motion abnormalities. The left ventricular internal cavity size was mildly dilated. Left ventricular diastolic parameters are consistent with Grade II diastolic dysfunction (pseudonormalization). Elevated left atrial pressure.  2. Right ventricular systolic function is normal. The right ventricular size is mildly enlarged.  3. The mitral valve is normal in structure. No evidence of mitral valve regurgitation. No evidence of mitral stenosis.  4. The aortic valve has an indeterminant number of cusps. Aortic valve regurgitation is trivial. Moderate aortic valve  stenosis.  5.  Aortic dilatation noted. There is mild dilatation of the aortic root, measuring 40 mm. There is mild dilatation of the ascending aorta, measuring 40 mm. FINDINGS  Left Ventricle: Left ventricular ejection fraction, by estimation, is 60 to 65%. The left ventricle has normal function. The left ventricle has no regional wall motion abnormalities. Definity contrast agent was given IV to delineate the left ventricular  endocardial borders. The left ventricular internal cavity size was mildly dilated. There is no left ventricular hypertrophy. Left ventricular diastolic parameters are consistent with Grade II diastolic dysfunction (pseudonormalization). Elevated left atrial pressure. Right Ventricle: The right ventricular size is mildly enlarged. Right ventricular systolic function is normal. Left Atrium: Left atrial size was normal in size. Right Atrium: Right atrial size was normal in size. Pericardium: There is no evidence of pericardial effusion. Mitral Valve: The mitral valve is normal in structure. Mild mitral annular calcification. No evidence of mitral valve regurgitation. No evidence of mitral valve stenosis. Tricuspid Valve: The tricuspid valve is normal in structure. Tricuspid valve regurgitation is not demonstrated. No evidence of tricuspid stenosis. Aortic Valve: The aortic valve has an indeterminant number of cusps. Aortic valve regurgitation is trivial. Moderate aortic stenosis is present. Aortic valve mean gradient measures 24.0 mmHg. Aortic valve peak gradient measures 37.7 mmHg. Pulmonic Valve: The pulmonic valve was grossly normal. Pulmonic valve regurgitation is not visualized. No evidence of pulmonic stenosis. Aorta: Aortic dilatation noted. There is mild dilatation of the aortic root, measuring 40 mm. There is mild dilatation of the ascending aorta, measuring 40 mm. Venous: The inferior vena cava was not well visualized. IAS/Shunts: No atrial level shunt detected by color flow Doppler.   LEFT VENTRICLE PLAX 2D LVIDd:         5.60 cm Diastology LVIDs:         3.80 cm LV e' medial:    5.22 cm/s LV PW:         1.00 cm LV E/e' medial:  20.5 LV IVS:        0.90 cm LV e' lateral:   6.96 cm/s                        LV E/e' lateral: 15.4  RIGHT VENTRICLE RV Basal diam:  4.70 cm RV S prime:     14.20 cm/s TAPSE (M-mode): 3.2 cm LEFT ATRIUM             Index       RIGHT ATRIUM           Index LA diam:        5.40 cm 1.87 cm/m  RA Area:     18.00 cm LA Vol (A2C):   45.3 ml 15.71 ml/m RA Volume:   52.40 ml  18.17 ml/m LA Vol (A4C):   91.5 ml 31.73 ml/m LA Biplane Vol: 68.4 ml 23.72 ml/m  AORTIC VALVE AV Vmax:           306.80 cm/s AV Vmean:          234.400 cm/s AV VTI:            0.641 m AV Peak Grad:      37.7 mmHg AV Mean Grad:      24.0 mmHg LVOT Vmax:         99.63 cm/s LVOT Vmean:        78.233 cm/s LVOT VTI:          0.209 m LVOT/AV VTI ratio: 0.33  AORTA Ao Root diam: 4.30 cm Ao Asc diam:  4.10 cm MITRAL VALVE MV Area (PHT): 3.42 cm     SHUNTS MV Decel Time: 222 msec     Systemic VTI: 0.21 m MV E velocity: 107.00 cm/s MV A velocity: 69.80 cm/s MV E/A ratio:  1.53 Kirk Ruths MD Electronically signed by Kirk Ruths MD Signature Date/Time: 01/21/2021/12:34:14 PM    Final    CT HEAD CODE STROKE WO CONTRAST  Result Date: 01/25/2021 CLINICAL DATA:  Code stroke.  Left leg weakness EXAM: CT HEAD WITHOUT CONTRAST TECHNIQUE: Contiguous axial images were obtained from the base of the skull through the vertex without intravenous contrast. COMPARISON:  Correlation made with MRI brain March 2022 FINDINGS: Brain: No acute intracranial hemorrhage, mass effect, or edema. Gray-white differentiation remains preserved. Minimal patchy hypoattenuation in the supratentorial white matter may reflect minor chronic microvascular ischemic changes. Ventricles and sulci are within normal limits in size and configuration. No extra-axial collection. Vascular: No hyperdense vessel. Skull: Unremarkable. Sinuses/Orbits:  Paranasal sinus mucosal thickening. No acute orbital abnormality. Other: Mastoid air cells are clear. ASPECTS ([place] Stroke Program Early CT Score) - Ganglionic level infarction (caudate, lentiform nuclei, internal capsule, insula, M1-M3 cortex): 7 - Supraganglionic infarction (M4-M6 cortex): 3 Total score (0-10 with 10 being normal): 10 IMPRESSION: There is no acute intracranial hemorrhage or evidence of acute infarction. ASPECT score is 10. These results were communicated to Dr. Lorrin Goodell at 6:49 pm on 01/25/2021 by text page via the [facility] messaging system. Electronically Signed   By: Macy Mis M.D.   On: 01/25/2021 18:50   ECHOCARDIOGRAM LIMITED  Result Date: 01/26/2021    ECHOCARDIOGRAM LIMITED REPORT   Patient Name:   Lawrence [name] Date of Exam: 01/26/2021 Medical Rec #:  [id number]      Height:       74.0 in Accession #:    [id number]     Weight:       388.0 lb Date of Birth:  [birth year]      BSA:          2.881 m Patient Age:    27 years       BP:           115/71 mmHg Patient Gender: M              HR:           103 bpm. Exam Location:  Inpatient Procedure: Limited Echo, Limited Color Doppler and Cardiac Doppler Indications:    Stroke I63.9  History:        Patient has prior history of Echocardiogram examinations, most                 recent 01/21/2021. CHF, COPD; Risk Factors:Hypertension and                 Diabetes.  Sonographer:    Mikki Santee RDCS (AE) Referring Phys: [id number] [place]  1. Limited study for LV function; limited doppler and not all views obtained.  2. Left ventricular ejection fraction, by estimation, is 70 to 75%. The left ventricle has hyperdynamic function. The left ventricle has no regional wall motion abnormalities.  3. Right ventricular systolic function is normal. The right ventricular size is normal.  4. The mitral valve is normal in structure. No evidence of mitral stenosis.  5. The aortic valve has an indeterminant number of cusps. Moderate  aortic valve stenosis. FINDINGS  Left Ventricle: Left ventricular ejection fraction, by estimation,  is 70 to 75%. The left ventricle has hyperdynamic function. The left ventricle has no regional wall motion abnormalities. The left ventricular internal cavity size was normal in size. Right Ventricle: The right ventricular size is normal. Right ventricular systolic function is normal. Left Atrium: Left atrial size was normal in size. Right Atrium: Right atrial size was normal in size. Pericardium: There is no evidence of pericardial effusion. Mitral Valve: The mitral valve is normal in structure. No evidence of mitral valve stenosis. Tricuspid Valve: The tricuspid valve is normal in structure. No evidence of tricuspid stenosis. Aortic Valve: The aortic valve has an indeterminant number of cusps. Moderate aortic stenosis is present. Aortic valve mean gradient measures 19.0 mmHg. Aortic valve peak gradient measures 32.9 mmHg. Aortic valve area, by VTI measures 1.53 cm. Aorta: The aortic root is normal in size and structure. Additional Comments: Limited study for LV function; limited doppler and not all views obtained. LEFT VENTRICLE PLAX 2D LVOT diam:     2.30 cm LV SV:         79 LV SV Index:   27 LVOT Area:     4.15 cm  AORTIC VALVE AV Area (Vmax):    1.54 cm AV Area (Vmean):   1.49 cm AV Area (VTI):     1.53 cm AV Vmax:           286.67 cm/s AV Vmean:          201.333 cm/s AV VTI:            0.516 m AV Peak Grad:      32.9 mmHg AV Mean Grad:      19.0 mmHg LVOT Vmax:         106.57 cm/s LVOT Vmean:        72.033 cm/s LVOT VTI:          0.190 m LVOT/AV VTI ratio: 0.37  SHUNTS Systemic VTI:  0.19 m Systemic Diam: 2.30 cm Kirk Ruths MD Electronically signed by Kirk Ruths MD Signature Date/Time: 01/26/2021/12:24:48 PM    Final    CT ANGIO HEAD CODE STROKE  Result Date: 01/25/2021 CLINICAL DATA:  Left leg weakness EXAM: CT ANGIOGRAPHY HEAD AND NECK CT PERFUSION BRAIN TECHNIQUE: Multidetector CT imaging of  the head and neck was performed using the standard protocol during bolus administration of intravenous contrast. Multiplanar CT image reconstructions and MIPs were obtained to evaluate the vascular anatomy. Carotid stenosis measurements (when applicable) are obtained utilizing NASCET criteria, using the distal internal carotid diameter as the denominator. Multiphase CT imaging of the brain was performed following IV bolus contrast injection. Subsequent parametric perfusion maps were calculated using RAPID software. CONTRAST:  156mL OMNIPAQUE IOHEXOL 350 MG/ML SOLN COMPARISON:  None. FINDINGS: CTA NECK Aortic arch: Great vessel origins are patent. There is no high-grade proximal subclavian artery stenosis. Right carotid system: Patent. Mild calcified plaque at the proximal ICA without stenosis. Retropharyngeal course. Left carotid system: Patent. Mild calcified plaque at the proximal ICA without stenosis. Retropharyngeal course. Vertebral arteries: Patent.  Left vertebral artery is dominant. Skeleton: Degenerative changes of the cervical spine. Other neck: Unremarkable. Upper chest: No apical lung mass. Review of the MIP images confirms the above findings CTA HEAD Anterior circulation: Intracranial internal carotid arteries are patent with calcified plaque but no significant stenosis. Anterior and middle cerebral arteries are patent. Posterior circulation: Intracranial vertebral arteries are patent. Basilar artery is patent. Posterior cerebral arteries are patent. Bilateral posterior communicating arteries are present. Venous sinuses: Patent as  allowed by contrast bolus timing. Review of the MIP images confirms the above findings CT Brain Perfusion Findings: CBF (<30%) Volume: 58m Perfusion (Tmax>6.0s) volume: 014mMismatch Volume: 65m75mnfarction Location: None. IMPRESSION: No large vessel occlusion, hemodynamically significant stenosis, or evidence of dissection. Perfusion imaging demonstrates no evidence of core  infarction or penumbra. Electronically Signed   By: PraMacy MisD.   On: 01/25/2021 19:09   CT ANGIO NECK CODE STROKE  Result Date: 01/25/2021 CLINICAL DATA:  Left leg weakness EXAM: CT ANGIOGRAPHY HEAD AND NECK CT PERFUSION BRAIN TECHNIQUE: Multidetector CT imaging of the head and neck was performed using the standard protocol during bolus administration of intravenous contrast. Multiplanar CT image reconstructions and MIPs were obtained to evaluate the vascular anatomy. Carotid stenosis measurements (when applicable) are obtained utilizing NASCET criteria, using the distal internal carotid diameter as the denominator. Multiphase CT imaging of the brain was performed following IV bolus contrast injection. Subsequent parametric perfusion maps were calculated using RAPID software. CONTRAST:  1065m68mNIPAQUE IOHEXOL 350 MG/ML SOLN COMPARISON:  None. FINDINGS: CTA NECK Aortic arch: Great vessel origins are patent. There is no high-grade proximal subclavian artery stenosis. Right carotid system: Patent. Mild calcified plaque at the proximal ICA without stenosis. Retropharyngeal course. Left carotid system: Patent. Mild calcified plaque at the proximal ICA without stenosis. Retropharyngeal course. Vertebral arteries: Patent.  Left vertebral artery is dominant. Skeleton: Degenerative changes of the cervical spine. Other neck: Unremarkable. Upper chest: No apical lung mass. Review of the MIP images confirms the above findings CTA HEAD Anterior circulation: Intracranial internal carotid arteries are patent with calcified plaque but no significant stenosis. Anterior and middle cerebral arteries are patent. Posterior circulation: Intracranial vertebral arteries are patent. Basilar artery is patent. Posterior cerebral arteries are patent. Bilateral posterior communicating arteries are present. Venous sinuses: Patent as allowed by contrast bolus timing. Review of the MIP images confirms the above findings CT Brain  Perfusion Findings: CBF (<30%) Volume: 65mL 75mfusion (Tmax>6.0s) volume: 65mL M58match Volume: 65mL In33mction Location: None. IMPRESSION: No large vessel occlusion, hemodynamically significant stenosis, or evidence of dissection. Perfusion imaging demonstrates no evidence of core infarction or penumbra. Electronically Signed   By: PraneilMacy Mis On: 01/25/2021 19:09     Subjective: Patient seen examined bedside, resting comfortably.  No complaints this morning.  Cardiology okay for discharge home today.  Denies headache, no visual changes, no chest pain, palpitations, no shortness of breath, no abdominal pain, no weakness, no fatigue, no paresthesias.  No acute events overnight per nurse staff.  Discharge Exam: Vitals:   02/11/21 0543 02/11/21 1055  BP: 121/75 108/71  Pulse: 83 81  Resp: 18   Temp: 97.9 F (36.6 C)   SpO2: 92%    Vitals:   02/11/21 0033 02/11/21 0143 02/11/21 0543 02/11/21 1055  BP:  117/77 121/75 108/71  Pulse: (!) 47 81 83 81  Resp:  17 18   Temp:  97.6 F (36.4 C) 97.9 F (36.6 C)   TempSrc:  Oral Oral   SpO2: 92% 90% 92%   Weight:      Height:        General: Pt is alert, awake, not in acute distress, obese Cardiovascular: RRR, S1/S2 +, no rubs, no gallops Respiratory: CTA bilaterally, no wheezing, no rhonchi, on room air Abdominal: Soft, NT, ND, bowel sounds + Extremities: 1+ pitting edema bilateral lower extremities to mid shin, chronic venous skin changes bilateral lower extremities, no cyanosis    The results of  significant diagnostics from this hospitalization (including imaging, microbiology, ancillary and laboratory) are listed below for reference.     Microbiology: Recent Results (from the past 240 hour(s))  SARS CORONAVIRUS 2 (TAT 6-24 HRS) Nasopharyngeal Nasopharyngeal Swab     Status: None   Collection Time: 02/08/21  4:07 PM   Specimen: Nasopharyngeal Swab  Result Value Ref Range Status   SARS Coronavirus 2 NEGATIVE NEGATIVE Final     Comment: (NOTE) SARS-CoV-2 target nucleic acids are NOT DETECTED.  The SARS-CoV-2 RNA is generally detectable in upper and lower respiratory specimens during the acute phase of infection. Negative results do not preclude SARS-CoV-2 infection, do not rule out co-infections with other pathogens, and should not be used as the sole basis for treatment or other patient management decisions. Negative results must be combined with clinical observations, patient history, and epidemiological information. The expected result is Negative.  Fact Sheet for Patients: SugarRoll.be  Fact Sheet for Healthcare Providers: https://www.woods-mathews.com/  This test is not yet approved or cleared by the [place] FDA and  has been authorized for detection and/or diagnosis of SARS-CoV-2 by FDA under an Emergency Use Authorization (EUA). This EUA will remain  in effect (meaning this test can be used) for the duration of the COVID-19 declaration under Se ction 564(b)(1) of the Act, 21 U.S.C. section 360bbb-3(b)(1), unless the authorization is terminated or revoked sooner.  Performed at [facility], [place] [address], [place], [place] [address]      Labs: BNP (last 3 results) Recent Labs    10/16/20 2343 10/22/20 0345 11/13/20 1528  BNP 61.4 36.3 67.5   Basic Metabolic Panel: Recent Labs  Lab 02/08/21 1536 02/09/21 0244 02/09/21 1304 02/10/21 0401 02/11/21 0445  NA 135 138  --  135 135  K 4.1 3.4*  --  3.6 3.7  CL 96* 100  --  99 96*  CO2 26 31  --  27 28  GLUCOSE 207* 160*  --  175* 141*  BUN 21 16  --  16 18  CREATININE 0.93 0.89  --  1.03 0.93  CALCIUM 9.5 8.8*  --  9.0 8.7*  MG 2.1  --  2.2  --  2.2   Liver Function Tests: No results for input(s): AST, ALT, ALKPHOS, BILITOT, PROT, ALBUMIN in the last 168 hours. No results for input(s): LIPASE, AMYLASE in the last 168 hours. No results for input(s): AMMONIA in the last 168  hours. CBC: Recent Labs  Lab 02/08/21 1536 02/09/21 0244 02/10/21 0401 02/11/21 0445  WBC 12.2* 11.9* 10.5 10.4  NEUTROABS  --   --  7.8*  --   HGB 14.3 13.4 14.1 13.4  HCT 44.2 41.3 43.3 42.1  MCV 91.3 90.6 90.4 92.5  PLT 320 314 304 310   Cardiac Enzymes: No results for input(s): CKTOTAL, CKMB, CKMBINDEX, TROPONINI in the last 168 hours. BNP: Invalid input(s): POCBNP CBG: Recent Labs  Lab 02/09/21 1108 02/09/21 1617 02/09/21 2037 02/10/21 0736 02/11/21 1139  GLUCAP 270* 181* 161* 177* 175*   D-Dimer No results for input(s): DDIMER in the last 72 hours. Hgb A1c No results for input(s): HGBA1C in the last 72 hours. Lipid Profile No results for input(s): CHOL, HDL, LDLCALC, TRIG, CHOLHDL, LDLDIRECT in the last 72 hours. Thyroid function studies Recent Labs    02/09/21 1304  TSH 1.442   Anemia work up No results for input(s): VITAMINB12, FOLATE, FERRITIN, TIBC, IRON, RETICCTPCT in the last 72 hours. Urinalysis    Component Value  Date/Time   COLORURINE STRAW (A) 01/25/2021 2000   APPEARANCEUR CLEAR 01/25/2021 2000   LABSPEC 1.015 01/25/2021 2000   PHURINE 7.0 01/25/2021 2000   GLUCOSEU >=500 (A) 01/25/2021 2000   HGBUR NEGATIVE 01/25/2021 2000   BILIRUBINUR NEGATIVE 01/25/2021 2000   KETONESUR NEGATIVE 01/25/2021 2000   PROTEINUR NEGATIVE 01/25/2021 2000   NITRITE NEGATIVE 01/25/2021 2000   LEUKOCYTESUR NEGATIVE 01/25/2021 2000   Sepsis Labs Invalid input(s): PROCALCITONIN,  WBC,  LACTICIDVEN Microbiology Recent Results (from the past 240 hour(s))  SARS CORONAVIRUS 2 (TAT 6-24 HRS) Nasopharyngeal Nasopharyngeal Swab     Status: None   Collection Time: 02/08/21  4:07 PM   Specimen: Nasopharyngeal Swab  Result Value Ref Range Status   SARS Coronavirus 2 NEGATIVE NEGATIVE Final    Comment: (NOTE) SARS-CoV-2 target nucleic acids are NOT DETECTED.  The SARS-CoV-2 RNA is generally detectable in upper and lower respiratory specimens during the acute phase  of infection. Negative results do not preclude SARS-CoV-2 infection, do not rule out co-infections with other pathogens, and should not be used as the sole basis for treatment or other patient management decisions. Negative results must be combined with clinical observations, patient history, and epidemiological information. The expected result is Negative.  Fact Sheet for Patients: SugarRoll.be  Fact Sheet for Healthcare Providers: https://www.woods-mathews.com/  This test is not yet approved or cleared by the [place] FDA and  has been authorized for detection and/or diagnosis of SARS-CoV-2 by FDA under an Emergency Use Authorization (EUA). This EUA will remain  in effect (meaning this test can be used) for the duration of the COVID-19 declaration under Se ction 564(b)(1) of the Act, 21 U.S.C. section 360bbb-3(b)(1), unless the authorization is terminated or revoked sooner.  Performed at [facility], [place] [address], [place], [place] [address]      Time coordinating discharge: Over 30 minutes  SIGNED:   Anup Brigham J [place] DO  [facility] 02/11/2021, 12:04 PM

## 2021-02-11 NOTE — TOC Initial Note (Addendum)
Transition of Care [facility]) - Initial/Assessment Note    Patient Details  Name: Lawrence [name] MRN: [id number] Date of Birth: 06-15-1956  Transition of Care [facility]) CM/SW Contact:    Bethena Roys, RN Phone Number: 02/11/2021, 1:37 PM  Clinical Narrative:  Risk for readmission assessment completed. Prior to arrival patient was from home with family support. Patient has transportation to appointments via [place]. Patient uses [facility] for prescriptions that are delivered to the home. Patient has durable medical equipment CPAP, rollator, and an Transport planner. No durable medical equipment needs at this time. Patient states he is active with [facility] and would like to resume services. Case Manager called [facility] and they are aware that the patient will transition home today. Patient will be transported via [facility] to home. [facility] called at 1300, per staff patient will be picked up between 4:00 pm and 6:00 pm. Patient to make his family aware of pickup time. No further needs from Case Manager at this time.                 6834 02-11-21 Bariatric Bedside commode ordered via [employer] and it will be delivered to the patient's home. No further needs identified.  Expected Discharge Plan: [place] Barriers to Discharge: No Barriers Identified   Patient Goals and CMS Choice Patient states their goals for this hospitalization and ongoing recovery are:: to return home with home health services.   Choice offered to / list presented to : NA (Patient is currently active with [facility], no list needed.)  Expected Discharge Plan and Services Expected Discharge Plan: [place]   Discharge Planning Services: CM Consult Post Acute Care Choice: [place] arrangements for the past 2 months: Apartment Expected Discharge Date: 02/11/21                 DME Agency: NA       HH Arranged: RN, PT HH Agency: [place] Date  HH Agency Contacted: 02/11/21 Time HH Agency Contacted: 1200 Representative spoke with at [place]: [place] Arrangements/Services Living arrangements for the past 2 months: [place] with:: Self, Significant Other Patient language and need for interpreter reviewed:: Yes Do you feel safe going back to the place where you live?: Yes      Need for Family Participation in Patient Care: Yes (Comment) Care giver support system in place?: Yes (comment) Current home services: DME Criminal Activity/Legal Involvement Pertinent to Current Situation/Hospitalization: No - Comment as needed  Activities of Daily Living Home Assistive Devices/Equipment: CBG Meter, BIPAP, Grab bars around toilet, Grab bars in shower, Hospital bed, Raised toilet seat with rails, Shower chair with back ADL Screening (condition at time of admission) Patient's cognitive ability adequate to safely complete daily activities?: Yes Is the patient deaf or have difficulty hearing?: No Does the patient have difficulty seeing, even when wearing glasses/contacts?: No Does the patient have difficulty concentrating, remembering, or making decisions?: Yes Patient able to express need for assistance with ADLs?: Yes Does the patient have difficulty dressing or bathing?: Yes Independently performs ADLs?: No Communication: Independent Dressing (OT): Needs assistance Is this a change from baseline?: Pre-admission baseline Grooming: Independent Feeding: Independent Bathing: Needs assistance Is this a change from baseline?: Pre-admission baseline Toileting: Needs assistance Is this a change from baseline?: Pre-admission baseline In/Out Bed: Needs assistance Is this a change from baseline?: Pre-admission baseline Walks in Home: Needs assistance Is this a change from baseline?:  Pre-admission baseline Does the patient have difficulty walking or climbing stairs?: Yes Weakness of Legs: Left Weakness of Arms/Hands:  None  Permission Sought/Granted Permission sought to share information with : Family Supports, Customer service manager, Case Optician, dispensing granted to share information with : Yes, Verbal Permission Granted     Permission granted to share info w AGENCY: [facility]        Emotional Assessment Appearance:: Appears stated age Attitude/Demeanor/Rapport: Engaged Affect (typically observed): Appropriate Orientation: : Oriented to Situation, Oriented to  Time, Oriented to Self, Oriented to Place Alcohol / Substance Use: Not Applicable Psych Involvement: No (comment)  Admission diagnosis:  Atrial fibrillation with rapid ventricular response ([place]) [I48.91] Atrial fibrillation with RVR ([place]) [I48.91] Left-sided weakness [R53.1] Patient Active Problem List   Diagnosis Date Noted   Left-sided weakness 02/09/2021   Atrial fibrillation with rapid ventricular response ([place]) 02/08/2021   Acute left-sided weakness 01/25/2021   AF (paroxysmal atrial fibrillation) ([place]) 01/25/2021   COPD (chronic obstructive pulmonary disease) ([place]) 01/25/2021   Moderate aortic stenosis 01/21/2021   Acquired dilation of ascending aorta and aortic root ([place]) 01/21/2021   Coronary artery disease due to lipid rich plaque    SVT (supraventricular tachycardia) ([place])    Partial seizure ([place])    Acute CHF (congestive heart failure) ([place]) 10/17/2020   Essential hypertension 10/17/2020   Hypokalemia 10/17/2020   Cellulitis of right leg 12/04/2019   Pain due to onychomycosis of toenails of both feet 09/11/2019   Diabetic neuropathy ([place]) 09/11/2019   Type 2 diabetes mellitus with diabetic polyneuropathy, with long-term current use of insulin ([place]) 09/11/2019   Sinus tachycardia 03/07/2019   Chronic venous stasis 03/07/2019   History of anemia 12/26/2018   Anemia 08/24/2018   History of colon polyps 08/24/2018   Do not resuscitate status 08/24/2018   Chronic diastolic CHF (congestive heart failure) ([place])  08/18/2018   Bilateral lower leg cellulitis    Coronary artery calcification seen on CAT scan    Pure hypercholesterolemia    Chest pain    Shortness of breath    Acute respiratory failure with hypoxia ([place]) 01/13/2018   Right foot pain 01/13/2018   Acute on chronic diastolic CHF (congestive heart failure) ([place]) 01/13/2018   Diabetes mellitus type 2 in obese ([place]) 01/13/2018   New onset type 2 diabetes mellitus ([place]) 11/08/2017   Cellulitis 11/07/2017   Primary osteoarthritis, left shoulder 03/05/2017   Essential hypertension, benign 02/22/2017   BPH without obstruction/lower urinary tract symptoms 02/22/2017   Hematuria 02/22/2017   Peripheral neuropathy 02/22/2017   Physical deconditioning 02/22/2017   Morbid obesity ([place])    PTSD (post-traumatic stress disorder)    Anasarca 01/31/2017   Bilateral lower extremity edema 01/31/2017   OSA (obstructive sleep apnea)    PCP:  [name], PA-C Pharmacy:   [facility] - [place], [place] - [address] [place] [address] [place] [address] Phone: [phone] Fax: [phone]     Social Determinants of Health (SDOH) Interventions    Readmission Risk Interventions Readmission Risk Prevention Plan 02/11/2021 12/06/2019  Transportation Screening Complete Complete  PCP or Specialist Appt within 5-7 Days - Complete  Home Care Screening - Complete  Medication Review (RN CM) - Complete  Medication Review (RN Care Manager) Complete -  [place] or Home Care Consult Complete -  SW Recovery Care/Counseling Consult Complete -  Palliative Care Screening Not Applicable -  [place] Not Applicable -  Some recent data might be  hidden

## 2021-02-11 NOTE — Consult Note (Signed)
   [facility] Inpatient Consult   02/11/2021  Lawrence [name] 1956/07/23 [id number]  [place] Organization [ACO] Patient: [name]   Patient screened for less than 30 days hospitalization with noted high risk score for unplanned readmission risk and  to assess for potential [place] Management service needs for post hospital transition.  Review of patient's medical record reveals patient is for home with home health. Reviewed inpatient [facility] RN progress notes for disposition.  Spoke with patient at the bedside. Explained [facility] Care Management for post hospital follow up for high risk patients.  Patient states he is pretty well set in the community with an aide from the [employer] [facility] 4 days a week.  He goes to the [place] for his annual check up and endorses [name], PA at [employer] as his primary care provider. He states he does not have additional follow up needs between [facility] and having [name] [SO] managing his care.   Plan:  No follow up planned at this time.  Patient verbalized understanding he can ask for additional nursing follow up, if needed.    For questions contact:   Natividad Brood, RN BSN [facility] Liaison  [phone] business mobile phone Toll free office [phone]  Fax number: [phone] [email or web address]

## 2021-02-11 NOTE — Progress Notes (Signed)
Physical Therapy Treatment Patient Details Name: Lawrence [name] MRN: [id number] DOB: 03-09-1956 Today's Date: 02/11/2021    History of Present Illness Hurshell Dino is a 65 y.o. male with medical history significant of hypertension, hyperlipidemia, DM type II, COPD,  paroxysmal atrial fibrillation on Eliquis, diastolic CHF( last EF 40-98%), moderate aortic stenosis, seizure disorder, OSA on BiPAP, gout, and depression presents with complaints of malaise which started yesterday.  He normally feels like this when he goes into atrial fibrillation.  MRI  negative for stroke. neurology Consulted:Suspected cerebral infarction due to occlusion of stenosis of small artery. MRI cervical spine is pending.. Patient  was recently [place] from hospital 01/26/21.:    PT Comments    Pt tolerates treatment well with much improved gait quality and distances. Pt reports ambulation feeling close to baseline at this time. Pt denies concern about transitioning back to rollator at home. Pt will benefit from continued acute PT POC to further improve activity tolerance.   Follow Up Recommendations  Home health PT;Supervision/Assistance - 24 hour     Equipment Recommendations  None recommended by PT    Recommendations for Other Services       Precautions / Restrictions Precautions Precautions: Fall Restrictions Weight Bearing Restrictions: No    Mobility  Bed Mobility Overal bed mobility: Modified Independent Bed Mobility: Supine to Sit     Supine to sit: Modified independent (Device/Increase time)     General bed mobility comments: HOB elevated, use of rails    Transfers Overall transfer level: Needs assistance Equipment used: Rolling walker (2 wheeled) (bariatric RW) Transfers: Sit to/from Stand Sit to Stand: Supervision            Ambulation/Gait Ambulation/Gait assistance: Supervision Gait Distance (Feet): 100 Feet Assistive device: Rolling walker (2 wheeled) Gait Pattern/deviations:  Step-through pattern Gait velocity: functional Gait velocity interpretation: 1.31 - 2.62 ft/sec, indicative of limited community ambulator General Gait Details: pt with slowed step-through gait   Stairs             Wheelchair Mobility    Modified Rankin (Stroke Patients Only)       Balance Overall balance assessment: Needs assistance Sitting-balance support: No upper extremity supported;Feet supported Sitting balance-Leahy Scale: Good     Standing balance support: Bilateral upper extremity supported Standing balance-Leahy Scale: Poor Standing balance comment: reliant on UE support                            Cognition Arousal/Alertness: Awake/alert Behavior During Therapy: WFL for tasks assessed/performed Overall Cognitive Status: Within Functional Limits for tasks assessed                                        Exercises      General Comments General comments (skin integrity, edema, etc.): VSS on RA      Pertinent Vitals/Pain Pain Assessment: No/denies pain    Home Living                      Prior Function            PT Goals (current goals can now be found in the care plan section) Acute Rehab PT Goals Patient Stated Goal: To return home. Progress towards PT goals: Progressing toward goals    Frequency    Min 3X/week      PT Plan  Current plan remains appropriate    Co-evaluation              AM-PAC PT "6 Clicks" Mobility   Outcome Measure  Help needed turning from your back to your side while in a flat bed without using bedrails?: None Help needed moving from lying on your back to sitting on the side of a flat bed without using bedrails?: None Help needed moving to and from a bed to a chair (including a wheelchair)?: A Little Help needed standing up from a chair using your arms (e.g., wheelchair or bedside chair)?: A Little Help needed to walk in hospital room?: A Little Help needed climbing  3-5 steps with a railing? : A Lot 6 Click Score: 19    End of Session   Activity Tolerance: Patient tolerated treatment well Patient left: in bed;with call bell/phone within reach Nurse Communication: Mobility status PT Visit Diagnosis: Unsteadiness on feet (R26.81);Muscle weakness (generalized) (M62.81);Difficulty in walking, not elsewhere classified (R26.2);Pain     Time: 8325-4982 PT Time Calculation (min) (ACUTE ONLY): 13 min  Charges:  $Gait Training: 8-22 mins                     Zenaida Niece, PT, DPT Acute Rehabilitation Pager: [phone]    Zenaida Niece 02/11/2021, 4:05 PM

## 2021-02-11 NOTE — Progress Notes (Signed)
Mobility Specialist: Progress Note   02/11/21 1117  Mobility  Activity Ambulated in hall  Level of Assistance Standby assist, set-up cues, supervision of patient - no hands on  Assistive Device Front wheel walker  Distance Ambulated (ft) 56 ft  Mobility Ambulated with assistance in hallway  Mobility Response Tolerated well  Mobility performed by Mobility specialist  $Mobility charge 1 Mobility   Pre-Mobility: 110 HR, 108/71 BP, 94% SpO2 During Mobility: 81 HR, 96% SpO2 Post-Mobility: 79 HR, 126/78 BP, 95% SpO2  Pt c/o 8/10 pain in his feet prior to ambulation but said he was at a 3/10 pain after. Pt otherwise asx. Pt needed walking O2 test, sats as seen above. Pt maintained >90% throughout ambulation. Pt is sitting EOB after walk with family members and case manager present in the room.  [facility] Kraven Calk Mobility Specialist Mobility Specialist Phone: [phone]

## 2021-02-12 DIAGNOSIS — I5033 Acute on chronic diastolic (congestive) heart failure: Secondary | ICD-10-CM | POA: Diagnosis not present

## 2021-02-12 DIAGNOSIS — I872 Venous insufficiency (chronic) (peripheral): Secondary | ICD-10-CM | POA: Diagnosis not present

## 2021-02-12 DIAGNOSIS — E871 Hypo-osmolality and hyponatremia: Secondary | ICD-10-CM | POA: Diagnosis not present

## 2021-02-12 DIAGNOSIS — E876 Hypokalemia: Secondary | ICD-10-CM | POA: Diagnosis not present

## 2021-02-12 DIAGNOSIS — R531 Weakness: Secondary | ICD-10-CM | POA: Diagnosis not present

## 2021-02-12 DIAGNOSIS — E1122 Type 2 diabetes mellitus with diabetic chronic kidney disease: Secondary | ICD-10-CM | POA: Diagnosis not present

## 2021-02-12 DIAGNOSIS — E1165 Type 2 diabetes mellitus with hyperglycemia: Secondary | ICD-10-CM | POA: Diagnosis not present

## 2021-02-12 DIAGNOSIS — E1169 Type 2 diabetes mellitus with other specified complication: Secondary | ICD-10-CM | POA: Diagnosis not present

## 2021-02-12 DIAGNOSIS — M109 Gout, unspecified: Secondary | ICD-10-CM | POA: Diagnosis not present

## 2021-02-12 DIAGNOSIS — G4733 Obstructive sleep apnea (adult) (pediatric): Secondary | ICD-10-CM | POA: Diagnosis not present

## 2021-02-12 DIAGNOSIS — J449 Chronic obstructive pulmonary disease, unspecified: Secondary | ICD-10-CM | POA: Diagnosis not present

## 2021-02-12 DIAGNOSIS — G4700 Insomnia, unspecified: Secondary | ICD-10-CM | POA: Diagnosis not present

## 2021-02-12 DIAGNOSIS — I471 Supraventricular tachycardia: Secondary | ICD-10-CM | POA: Diagnosis not present

## 2021-02-12 DIAGNOSIS — I130 Hypertensive heart and chronic kidney disease with heart failure and stage 1 through stage 4 chronic kidney disease, or unspecified chronic kidney disease: Secondary | ICD-10-CM | POA: Diagnosis not present

## 2021-02-12 DIAGNOSIS — I2510 Atherosclerotic heart disease of native coronary artery without angina pectoris: Secondary | ICD-10-CM | POA: Diagnosis not present

## 2021-02-12 DIAGNOSIS — E785 Hyperlipidemia, unspecified: Secondary | ICD-10-CM | POA: Diagnosis not present

## 2021-02-12 DIAGNOSIS — R569 Unspecified convulsions: Secondary | ICD-10-CM | POA: Diagnosis not present

## 2021-02-12 DIAGNOSIS — E1151 Type 2 diabetes mellitus with diabetic peripheral angiopathy without gangrene: Secondary | ICD-10-CM | POA: Diagnosis not present

## 2021-02-12 DIAGNOSIS — N183 Chronic kidney disease, stage 3 unspecified: Secondary | ICD-10-CM | POA: Diagnosis not present

## 2021-02-12 DIAGNOSIS — E1140 Type 2 diabetes mellitus with diabetic neuropathy, unspecified: Secondary | ICD-10-CM | POA: Diagnosis not present

## 2021-02-12 DIAGNOSIS — J9601 Acute respiratory failure with hypoxia: Secondary | ICD-10-CM | POA: Diagnosis not present

## 2021-02-12 NOTE — Progress Notes (Signed)
Patient discharged to home with [facility] transportation via stretcher.  Patient's DNR order was given to the patient and PTAR on discharge.

## 2021-02-15 DIAGNOSIS — E1165 Type 2 diabetes mellitus with hyperglycemia: Secondary | ICD-10-CM | POA: Diagnosis not present

## 2021-02-15 DIAGNOSIS — R569 Unspecified convulsions: Secondary | ICD-10-CM | POA: Diagnosis not present

## 2021-02-15 DIAGNOSIS — E871 Hypo-osmolality and hyponatremia: Secondary | ICD-10-CM | POA: Diagnosis not present

## 2021-02-15 DIAGNOSIS — I872 Venous insufficiency (chronic) (peripheral): Secondary | ICD-10-CM | POA: Diagnosis not present

## 2021-02-15 DIAGNOSIS — E1140 Type 2 diabetes mellitus with diabetic neuropathy, unspecified: Secondary | ICD-10-CM | POA: Diagnosis not present

## 2021-02-15 DIAGNOSIS — E1169 Type 2 diabetes mellitus with other specified complication: Secondary | ICD-10-CM | POA: Diagnosis not present

## 2021-02-15 DIAGNOSIS — I471 Supraventricular tachycardia: Secondary | ICD-10-CM | POA: Diagnosis not present

## 2021-02-15 DIAGNOSIS — J449 Chronic obstructive pulmonary disease, unspecified: Secondary | ICD-10-CM | POA: Diagnosis not present

## 2021-02-15 DIAGNOSIS — I5033 Acute on chronic diastolic (congestive) heart failure: Secondary | ICD-10-CM | POA: Diagnosis not present

## 2021-02-15 DIAGNOSIS — J9601 Acute respiratory failure with hypoxia: Secondary | ICD-10-CM | POA: Diagnosis not present

## 2021-02-15 DIAGNOSIS — M109 Gout, unspecified: Secondary | ICD-10-CM | POA: Diagnosis not present

## 2021-02-15 DIAGNOSIS — E1122 Type 2 diabetes mellitus with diabetic chronic kidney disease: Secondary | ICD-10-CM | POA: Diagnosis not present

## 2021-02-15 DIAGNOSIS — G4733 Obstructive sleep apnea (adult) (pediatric): Secondary | ICD-10-CM | POA: Diagnosis not present

## 2021-02-15 DIAGNOSIS — G4700 Insomnia, unspecified: Secondary | ICD-10-CM | POA: Diagnosis not present

## 2021-02-15 DIAGNOSIS — N183 Chronic kidney disease, stage 3 unspecified: Secondary | ICD-10-CM | POA: Diagnosis not present

## 2021-02-15 DIAGNOSIS — E1151 Type 2 diabetes mellitus with diabetic peripheral angiopathy without gangrene: Secondary | ICD-10-CM | POA: Diagnosis not present

## 2021-02-15 DIAGNOSIS — E785 Hyperlipidemia, unspecified: Secondary | ICD-10-CM | POA: Diagnosis not present

## 2021-02-15 DIAGNOSIS — E876 Hypokalemia: Secondary | ICD-10-CM | POA: Diagnosis not present

## 2021-02-15 DIAGNOSIS — I2510 Atherosclerotic heart disease of native coronary artery without angina pectoris: Secondary | ICD-10-CM | POA: Diagnosis not present

## 2021-02-15 DIAGNOSIS — I130 Hypertensive heart and chronic kidney disease with heart failure and stage 1 through stage 4 chronic kidney disease, or unspecified chronic kidney disease: Secondary | ICD-10-CM | POA: Diagnosis not present

## 2021-02-17 DIAGNOSIS — Z79891 Long term (current) use of opiate analgesic: Secondary | ICD-10-CM | POA: Diagnosis not present

## 2021-02-19 DIAGNOSIS — I872 Venous insufficiency (chronic) (peripheral): Secondary | ICD-10-CM | POA: Diagnosis not present

## 2021-02-19 DIAGNOSIS — E876 Hypokalemia: Secondary | ICD-10-CM | POA: Diagnosis not present

## 2021-02-19 DIAGNOSIS — E1169 Type 2 diabetes mellitus with other specified complication: Secondary | ICD-10-CM | POA: Diagnosis not present

## 2021-02-19 DIAGNOSIS — E1151 Type 2 diabetes mellitus with diabetic peripheral angiopathy without gangrene: Secondary | ICD-10-CM | POA: Diagnosis not present

## 2021-02-19 DIAGNOSIS — G4733 Obstructive sleep apnea (adult) (pediatric): Secondary | ICD-10-CM | POA: Diagnosis not present

## 2021-02-19 DIAGNOSIS — R569 Unspecified convulsions: Secondary | ICD-10-CM | POA: Diagnosis not present

## 2021-02-19 DIAGNOSIS — E1140 Type 2 diabetes mellitus with diabetic neuropathy, unspecified: Secondary | ICD-10-CM | POA: Diagnosis not present

## 2021-02-19 DIAGNOSIS — J449 Chronic obstructive pulmonary disease, unspecified: Secondary | ICD-10-CM | POA: Diagnosis not present

## 2021-02-19 DIAGNOSIS — E871 Hypo-osmolality and hyponatremia: Secondary | ICD-10-CM | POA: Diagnosis not present

## 2021-02-19 DIAGNOSIS — M109 Gout, unspecified: Secondary | ICD-10-CM | POA: Diagnosis not present

## 2021-02-19 DIAGNOSIS — I2510 Atherosclerotic heart disease of native coronary artery without angina pectoris: Secondary | ICD-10-CM | POA: Diagnosis not present

## 2021-02-19 DIAGNOSIS — E1165 Type 2 diabetes mellitus with hyperglycemia: Secondary | ICD-10-CM | POA: Diagnosis not present

## 2021-02-19 DIAGNOSIS — I130 Hypertensive heart and chronic kidney disease with heart failure and stage 1 through stage 4 chronic kidney disease, or unspecified chronic kidney disease: Secondary | ICD-10-CM | POA: Diagnosis not present

## 2021-02-19 DIAGNOSIS — I471 Supraventricular tachycardia: Secondary | ICD-10-CM | POA: Diagnosis not present

## 2021-02-19 DIAGNOSIS — N183 Chronic kidney disease, stage 3 unspecified: Secondary | ICD-10-CM | POA: Diagnosis not present

## 2021-02-19 DIAGNOSIS — G4700 Insomnia, unspecified: Secondary | ICD-10-CM | POA: Diagnosis not present

## 2021-02-19 DIAGNOSIS — E785 Hyperlipidemia, unspecified: Secondary | ICD-10-CM | POA: Diagnosis not present

## 2021-02-19 DIAGNOSIS — E1122 Type 2 diabetes mellitus with diabetic chronic kidney disease: Secondary | ICD-10-CM | POA: Diagnosis not present

## 2021-02-19 DIAGNOSIS — J9601 Acute respiratory failure with hypoxia: Secondary | ICD-10-CM | POA: Diagnosis not present

## 2021-02-19 DIAGNOSIS — I5033 Acute on chronic diastolic (congestive) heart failure: Secondary | ICD-10-CM | POA: Diagnosis not present

## 2021-02-23 DIAGNOSIS — I471 Supraventricular tachycardia: Secondary | ICD-10-CM | POA: Diagnosis not present

## 2021-02-23 DIAGNOSIS — J9601 Acute respiratory failure with hypoxia: Secondary | ICD-10-CM | POA: Diagnosis not present

## 2021-02-23 DIAGNOSIS — E871 Hypo-osmolality and hyponatremia: Secondary | ICD-10-CM | POA: Diagnosis not present

## 2021-02-23 DIAGNOSIS — I2510 Atherosclerotic heart disease of native coronary artery without angina pectoris: Secondary | ICD-10-CM | POA: Diagnosis not present

## 2021-02-23 DIAGNOSIS — N183 Chronic kidney disease, stage 3 unspecified: Secondary | ICD-10-CM | POA: Diagnosis not present

## 2021-02-23 DIAGNOSIS — E876 Hypokalemia: Secondary | ICD-10-CM | POA: Diagnosis not present

## 2021-02-23 DIAGNOSIS — E1122 Type 2 diabetes mellitus with diabetic chronic kidney disease: Secondary | ICD-10-CM | POA: Diagnosis not present

## 2021-02-23 DIAGNOSIS — E1169 Type 2 diabetes mellitus with other specified complication: Secondary | ICD-10-CM | POA: Diagnosis not present

## 2021-02-23 DIAGNOSIS — I5033 Acute on chronic diastolic (congestive) heart failure: Secondary | ICD-10-CM | POA: Diagnosis not present

## 2021-02-23 DIAGNOSIS — E1140 Type 2 diabetes mellitus with diabetic neuropathy, unspecified: Secondary | ICD-10-CM | POA: Diagnosis not present

## 2021-02-23 DIAGNOSIS — G4700 Insomnia, unspecified: Secondary | ICD-10-CM | POA: Diagnosis not present

## 2021-02-23 DIAGNOSIS — M109 Gout, unspecified: Secondary | ICD-10-CM | POA: Diagnosis not present

## 2021-02-23 DIAGNOSIS — E1151 Type 2 diabetes mellitus with diabetic peripheral angiopathy without gangrene: Secondary | ICD-10-CM | POA: Diagnosis not present

## 2021-02-23 DIAGNOSIS — I130 Hypertensive heart and chronic kidney disease with heart failure and stage 1 through stage 4 chronic kidney disease, or unspecified chronic kidney disease: Secondary | ICD-10-CM | POA: Diagnosis not present

## 2021-02-23 DIAGNOSIS — I872 Venous insufficiency (chronic) (peripheral): Secondary | ICD-10-CM | POA: Diagnosis not present

## 2021-02-23 DIAGNOSIS — J449 Chronic obstructive pulmonary disease, unspecified: Secondary | ICD-10-CM | POA: Diagnosis not present

## 2021-02-23 DIAGNOSIS — E785 Hyperlipidemia, unspecified: Secondary | ICD-10-CM | POA: Diagnosis not present

## 2021-02-23 DIAGNOSIS — R569 Unspecified convulsions: Secondary | ICD-10-CM | POA: Diagnosis not present

## 2021-02-23 DIAGNOSIS — G4733 Obstructive sleep apnea (adult) (pediatric): Secondary | ICD-10-CM | POA: Diagnosis not present

## 2021-02-23 DIAGNOSIS — E1165 Type 2 diabetes mellitus with hyperglycemia: Secondary | ICD-10-CM | POA: Diagnosis not present

## 2021-02-24 DIAGNOSIS — J449 Chronic obstructive pulmonary disease, unspecified: Secondary | ICD-10-CM | POA: Diagnosis not present

## 2021-02-24 DIAGNOSIS — N183 Chronic kidney disease, stage 3 unspecified: Secondary | ICD-10-CM | POA: Diagnosis not present

## 2021-02-24 DIAGNOSIS — G4700 Insomnia, unspecified: Secondary | ICD-10-CM | POA: Diagnosis not present

## 2021-02-24 DIAGNOSIS — E1122 Type 2 diabetes mellitus with diabetic chronic kidney disease: Secondary | ICD-10-CM | POA: Diagnosis not present

## 2021-02-24 DIAGNOSIS — I130 Hypertensive heart and chronic kidney disease with heart failure and stage 1 through stage 4 chronic kidney disease, or unspecified chronic kidney disease: Secondary | ICD-10-CM | POA: Diagnosis not present

## 2021-02-24 DIAGNOSIS — M109 Gout, unspecified: Secondary | ICD-10-CM | POA: Diagnosis not present

## 2021-02-24 DIAGNOSIS — E1151 Type 2 diabetes mellitus with diabetic peripheral angiopathy without gangrene: Secondary | ICD-10-CM | POA: Diagnosis not present

## 2021-02-24 DIAGNOSIS — I2510 Atherosclerotic heart disease of native coronary artery without angina pectoris: Secondary | ICD-10-CM | POA: Diagnosis not present

## 2021-02-24 DIAGNOSIS — I471 Supraventricular tachycardia: Secondary | ICD-10-CM | POA: Diagnosis not present

## 2021-02-24 DIAGNOSIS — E1169 Type 2 diabetes mellitus with other specified complication: Secondary | ICD-10-CM | POA: Diagnosis not present

## 2021-02-24 DIAGNOSIS — G4733 Obstructive sleep apnea (adult) (pediatric): Secondary | ICD-10-CM | POA: Diagnosis not present

## 2021-02-24 DIAGNOSIS — I5033 Acute on chronic diastolic (congestive) heart failure: Secondary | ICD-10-CM | POA: Diagnosis not present

## 2021-02-24 DIAGNOSIS — I872 Venous insufficiency (chronic) (peripheral): Secondary | ICD-10-CM | POA: Diagnosis not present

## 2021-02-24 DIAGNOSIS — E876 Hypokalemia: Secondary | ICD-10-CM | POA: Diagnosis not present

## 2021-02-24 DIAGNOSIS — E785 Hyperlipidemia, unspecified: Secondary | ICD-10-CM | POA: Diagnosis not present

## 2021-02-24 DIAGNOSIS — E1165 Type 2 diabetes mellitus with hyperglycemia: Secondary | ICD-10-CM | POA: Diagnosis not present

## 2021-02-24 DIAGNOSIS — J9601 Acute respiratory failure with hypoxia: Secondary | ICD-10-CM | POA: Diagnosis not present

## 2021-02-24 DIAGNOSIS — E871 Hypo-osmolality and hyponatremia: Secondary | ICD-10-CM | POA: Diagnosis not present

## 2021-02-24 DIAGNOSIS — E1140 Type 2 diabetes mellitus with diabetic neuropathy, unspecified: Secondary | ICD-10-CM | POA: Diagnosis not present

## 2021-02-24 DIAGNOSIS — R569 Unspecified convulsions: Secondary | ICD-10-CM | POA: Diagnosis not present

## 2021-02-25 ENCOUNTER — Ambulatory Visit: Payer: Medicare Other | Admitting: Cardiology

## 2021-02-26 ENCOUNTER — Other Ambulatory Visit: Payer: Self-pay | Admitting: Adult Health

## 2021-02-26 DIAGNOSIS — G4700 Insomnia, unspecified: Secondary | ICD-10-CM

## 2021-02-26 NOTE — Telephone Encounter (Signed)
Last filled 01/22/21 appt on 8/4

## 2021-03-01 ENCOUNTER — Ambulatory Visit: Payer: Medicare Other | Admitting: Neurology

## 2021-03-01 DIAGNOSIS — E1165 Type 2 diabetes mellitus with hyperglycemia: Secondary | ICD-10-CM | POA: Diagnosis not present

## 2021-03-01 DIAGNOSIS — I2510 Atherosclerotic heart disease of native coronary artery without angina pectoris: Secondary | ICD-10-CM | POA: Diagnosis not present

## 2021-03-01 DIAGNOSIS — J449 Chronic obstructive pulmonary disease, unspecified: Secondary | ICD-10-CM | POA: Diagnosis not present

## 2021-03-01 DIAGNOSIS — G459 Transient cerebral ischemic attack, unspecified: Secondary | ICD-10-CM | POA: Diagnosis not present

## 2021-03-01 DIAGNOSIS — I5032 Chronic diastolic (congestive) heart failure: Secondary | ICD-10-CM | POA: Diagnosis not present

## 2021-03-01 DIAGNOSIS — I480 Paroxysmal atrial fibrillation: Secondary | ICD-10-CM | POA: Diagnosis not present

## 2021-03-01 DIAGNOSIS — E785 Hyperlipidemia, unspecified: Secondary | ICD-10-CM | POA: Diagnosis not present

## 2021-03-01 DIAGNOSIS — E1169 Type 2 diabetes mellitus with other specified complication: Secondary | ICD-10-CM | POA: Diagnosis not present

## 2021-03-01 DIAGNOSIS — M1990 Unspecified osteoarthritis, unspecified site: Secondary | ICD-10-CM | POA: Diagnosis not present

## 2021-03-01 DIAGNOSIS — I10 Essential (primary) hypertension: Secondary | ICD-10-CM | POA: Diagnosis not present

## 2021-03-01 DIAGNOSIS — G4700 Insomnia, unspecified: Secondary | ICD-10-CM | POA: Diagnosis not present

## 2021-03-02 DIAGNOSIS — I872 Venous insufficiency (chronic) (peripheral): Secondary | ICD-10-CM | POA: Diagnosis not present

## 2021-03-02 DIAGNOSIS — M109 Gout, unspecified: Secondary | ICD-10-CM | POA: Diagnosis not present

## 2021-03-02 DIAGNOSIS — E1169 Type 2 diabetes mellitus with other specified complication: Secondary | ICD-10-CM | POA: Diagnosis not present

## 2021-03-02 DIAGNOSIS — I471 Supraventricular tachycardia: Secondary | ICD-10-CM | POA: Diagnosis not present

## 2021-03-02 DIAGNOSIS — G4733 Obstructive sleep apnea (adult) (pediatric): Secondary | ICD-10-CM | POA: Diagnosis not present

## 2021-03-02 DIAGNOSIS — R569 Unspecified convulsions: Secondary | ICD-10-CM | POA: Diagnosis not present

## 2021-03-02 DIAGNOSIS — N183 Chronic kidney disease, stage 3 unspecified: Secondary | ICD-10-CM | POA: Diagnosis not present

## 2021-03-02 DIAGNOSIS — E1165 Type 2 diabetes mellitus with hyperglycemia: Secondary | ICD-10-CM | POA: Diagnosis not present

## 2021-03-02 DIAGNOSIS — I2510 Atherosclerotic heart disease of native coronary artery without angina pectoris: Secondary | ICD-10-CM | POA: Diagnosis not present

## 2021-03-02 DIAGNOSIS — J449 Chronic obstructive pulmonary disease, unspecified: Secondary | ICD-10-CM | POA: Diagnosis not present

## 2021-03-02 DIAGNOSIS — I130 Hypertensive heart and chronic kidney disease with heart failure and stage 1 through stage 4 chronic kidney disease, or unspecified chronic kidney disease: Secondary | ICD-10-CM | POA: Diagnosis not present

## 2021-03-02 DIAGNOSIS — I5033 Acute on chronic diastolic (congestive) heart failure: Secondary | ICD-10-CM | POA: Diagnosis not present

## 2021-03-02 DIAGNOSIS — J9601 Acute respiratory failure with hypoxia: Secondary | ICD-10-CM | POA: Diagnosis not present

## 2021-03-02 DIAGNOSIS — E1122 Type 2 diabetes mellitus with diabetic chronic kidney disease: Secondary | ICD-10-CM | POA: Diagnosis not present

## 2021-03-02 DIAGNOSIS — E871 Hypo-osmolality and hyponatremia: Secondary | ICD-10-CM | POA: Diagnosis not present

## 2021-03-02 DIAGNOSIS — E1140 Type 2 diabetes mellitus with diabetic neuropathy, unspecified: Secondary | ICD-10-CM | POA: Diagnosis not present

## 2021-03-02 DIAGNOSIS — E1151 Type 2 diabetes mellitus with diabetic peripheral angiopathy without gangrene: Secondary | ICD-10-CM | POA: Diagnosis not present

## 2021-03-02 DIAGNOSIS — G4700 Insomnia, unspecified: Secondary | ICD-10-CM | POA: Diagnosis not present

## 2021-03-02 DIAGNOSIS — E785 Hyperlipidemia, unspecified: Secondary | ICD-10-CM | POA: Diagnosis not present

## 2021-03-02 DIAGNOSIS — E876 Hypokalemia: Secondary | ICD-10-CM | POA: Diagnosis not present

## 2021-03-04 ENCOUNTER — Ambulatory Visit: Payer: Medicare Other | Admitting: Neurology

## 2021-03-09 DIAGNOSIS — J449 Chronic obstructive pulmonary disease, unspecified: Secondary | ICD-10-CM | POA: Diagnosis not present

## 2021-03-09 DIAGNOSIS — E785 Hyperlipidemia, unspecified: Secondary | ICD-10-CM | POA: Diagnosis not present

## 2021-03-09 DIAGNOSIS — E1140 Type 2 diabetes mellitus with diabetic neuropathy, unspecified: Secondary | ICD-10-CM | POA: Diagnosis not present

## 2021-03-09 DIAGNOSIS — R569 Unspecified convulsions: Secondary | ICD-10-CM | POA: Diagnosis not present

## 2021-03-09 DIAGNOSIS — I2510 Atherosclerotic heart disease of native coronary artery without angina pectoris: Secondary | ICD-10-CM | POA: Diagnosis not present

## 2021-03-09 DIAGNOSIS — G4700 Insomnia, unspecified: Secondary | ICD-10-CM | POA: Diagnosis not present

## 2021-03-09 DIAGNOSIS — M109 Gout, unspecified: Secondary | ICD-10-CM | POA: Diagnosis not present

## 2021-03-09 DIAGNOSIS — E876 Hypokalemia: Secondary | ICD-10-CM | POA: Diagnosis not present

## 2021-03-09 DIAGNOSIS — E1169 Type 2 diabetes mellitus with other specified complication: Secondary | ICD-10-CM | POA: Diagnosis not present

## 2021-03-09 DIAGNOSIS — I471 Supraventricular tachycardia: Secondary | ICD-10-CM | POA: Diagnosis not present

## 2021-03-09 DIAGNOSIS — I872 Venous insufficiency (chronic) (peripheral): Secondary | ICD-10-CM | POA: Diagnosis not present

## 2021-03-09 DIAGNOSIS — E1165 Type 2 diabetes mellitus with hyperglycemia: Secondary | ICD-10-CM | POA: Diagnosis not present

## 2021-03-09 DIAGNOSIS — J9601 Acute respiratory failure with hypoxia: Secondary | ICD-10-CM | POA: Diagnosis not present

## 2021-03-09 DIAGNOSIS — G4733 Obstructive sleep apnea (adult) (pediatric): Secondary | ICD-10-CM | POA: Diagnosis not present

## 2021-03-09 DIAGNOSIS — E871 Hypo-osmolality and hyponatremia: Secondary | ICD-10-CM | POA: Diagnosis not present

## 2021-03-09 DIAGNOSIS — E1122 Type 2 diabetes mellitus with diabetic chronic kidney disease: Secondary | ICD-10-CM | POA: Diagnosis not present

## 2021-03-09 DIAGNOSIS — I130 Hypertensive heart and chronic kidney disease with heart failure and stage 1 through stage 4 chronic kidney disease, or unspecified chronic kidney disease: Secondary | ICD-10-CM | POA: Diagnosis not present

## 2021-03-09 DIAGNOSIS — N183 Chronic kidney disease, stage 3 unspecified: Secondary | ICD-10-CM | POA: Diagnosis not present

## 2021-03-09 DIAGNOSIS — E1151 Type 2 diabetes mellitus with diabetic peripheral angiopathy without gangrene: Secondary | ICD-10-CM | POA: Diagnosis not present

## 2021-03-09 DIAGNOSIS — I5033 Acute on chronic diastolic (congestive) heart failure: Secondary | ICD-10-CM | POA: Diagnosis not present

## 2021-03-10 DIAGNOSIS — J9601 Acute respiratory failure with hypoxia: Secondary | ICD-10-CM | POA: Diagnosis not present

## 2021-03-10 DIAGNOSIS — E1165 Type 2 diabetes mellitus with hyperglycemia: Secondary | ICD-10-CM | POA: Diagnosis not present

## 2021-03-10 DIAGNOSIS — G4733 Obstructive sleep apnea (adult) (pediatric): Secondary | ICD-10-CM | POA: Diagnosis not present

## 2021-03-10 DIAGNOSIS — E1151 Type 2 diabetes mellitus with diabetic peripheral angiopathy without gangrene: Secondary | ICD-10-CM | POA: Diagnosis not present

## 2021-03-10 DIAGNOSIS — M109 Gout, unspecified: Secondary | ICD-10-CM | POA: Diagnosis not present

## 2021-03-10 DIAGNOSIS — N183 Chronic kidney disease, stage 3 unspecified: Secondary | ICD-10-CM | POA: Diagnosis not present

## 2021-03-10 DIAGNOSIS — I130 Hypertensive heart and chronic kidney disease with heart failure and stage 1 through stage 4 chronic kidney disease, or unspecified chronic kidney disease: Secondary | ICD-10-CM | POA: Diagnosis not present

## 2021-03-10 DIAGNOSIS — J449 Chronic obstructive pulmonary disease, unspecified: Secondary | ICD-10-CM | POA: Diagnosis not present

## 2021-03-10 DIAGNOSIS — I471 Supraventricular tachycardia: Secondary | ICD-10-CM | POA: Diagnosis not present

## 2021-03-10 DIAGNOSIS — G4700 Insomnia, unspecified: Secondary | ICD-10-CM | POA: Diagnosis not present

## 2021-03-10 DIAGNOSIS — R569 Unspecified convulsions: Secondary | ICD-10-CM | POA: Diagnosis not present

## 2021-03-10 DIAGNOSIS — E1140 Type 2 diabetes mellitus with diabetic neuropathy, unspecified: Secondary | ICD-10-CM | POA: Diagnosis not present

## 2021-03-10 DIAGNOSIS — I872 Venous insufficiency (chronic) (peripheral): Secondary | ICD-10-CM | POA: Diagnosis not present

## 2021-03-10 DIAGNOSIS — I5033 Acute on chronic diastolic (congestive) heart failure: Secondary | ICD-10-CM | POA: Diagnosis not present

## 2021-03-10 DIAGNOSIS — E785 Hyperlipidemia, unspecified: Secondary | ICD-10-CM | POA: Diagnosis not present

## 2021-03-10 DIAGNOSIS — E876 Hypokalemia: Secondary | ICD-10-CM | POA: Diagnosis not present

## 2021-03-10 DIAGNOSIS — E1169 Type 2 diabetes mellitus with other specified complication: Secondary | ICD-10-CM | POA: Diagnosis not present

## 2021-03-10 DIAGNOSIS — E1122 Type 2 diabetes mellitus with diabetic chronic kidney disease: Secondary | ICD-10-CM | POA: Diagnosis not present

## 2021-03-10 DIAGNOSIS — E871 Hypo-osmolality and hyponatremia: Secondary | ICD-10-CM | POA: Diagnosis not present

## 2021-03-10 DIAGNOSIS — I2510 Atherosclerotic heart disease of native coronary artery without angina pectoris: Secondary | ICD-10-CM | POA: Diagnosis not present

## 2021-03-11 ENCOUNTER — Telehealth: Payer: Medicare Other | Admitting: Adult Health

## 2021-03-12 NOTE — Progress Notes (Signed)
Appointment cancelled

## 2021-03-15 DIAGNOSIS — J9601 Acute respiratory failure with hypoxia: Secondary | ICD-10-CM | POA: Diagnosis not present

## 2021-03-15 DIAGNOSIS — E871 Hypo-osmolality and hyponatremia: Secondary | ICD-10-CM | POA: Diagnosis not present

## 2021-03-15 DIAGNOSIS — N183 Chronic kidney disease, stage 3 unspecified: Secondary | ICD-10-CM | POA: Diagnosis not present

## 2021-03-15 DIAGNOSIS — E1165 Type 2 diabetes mellitus with hyperglycemia: Secondary | ICD-10-CM | POA: Diagnosis not present

## 2021-03-15 DIAGNOSIS — I130 Hypertensive heart and chronic kidney disease with heart failure and stage 1 through stage 4 chronic kidney disease, or unspecified chronic kidney disease: Secondary | ICD-10-CM | POA: Diagnosis not present

## 2021-03-15 DIAGNOSIS — I2510 Atherosclerotic heart disease of native coronary artery without angina pectoris: Secondary | ICD-10-CM | POA: Diagnosis not present

## 2021-03-15 DIAGNOSIS — I471 Supraventricular tachycardia: Secondary | ICD-10-CM | POA: Diagnosis not present

## 2021-03-15 DIAGNOSIS — R569 Unspecified convulsions: Secondary | ICD-10-CM | POA: Diagnosis not present

## 2021-03-15 DIAGNOSIS — J449 Chronic obstructive pulmonary disease, unspecified: Secondary | ICD-10-CM | POA: Diagnosis not present

## 2021-03-15 DIAGNOSIS — E876 Hypokalemia: Secondary | ICD-10-CM | POA: Diagnosis not present

## 2021-03-15 DIAGNOSIS — E1151 Type 2 diabetes mellitus with diabetic peripheral angiopathy without gangrene: Secondary | ICD-10-CM | POA: Diagnosis not present

## 2021-03-15 DIAGNOSIS — G4733 Obstructive sleep apnea (adult) (pediatric): Secondary | ICD-10-CM | POA: Diagnosis not present

## 2021-03-15 DIAGNOSIS — G4700 Insomnia, unspecified: Secondary | ICD-10-CM | POA: Diagnosis not present

## 2021-03-15 DIAGNOSIS — E1122 Type 2 diabetes mellitus with diabetic chronic kidney disease: Secondary | ICD-10-CM | POA: Diagnosis not present

## 2021-03-15 DIAGNOSIS — I872 Venous insufficiency (chronic) (peripheral): Secondary | ICD-10-CM | POA: Diagnosis not present

## 2021-03-15 DIAGNOSIS — E1169 Type 2 diabetes mellitus with other specified complication: Secondary | ICD-10-CM | POA: Diagnosis not present

## 2021-03-15 DIAGNOSIS — M109 Gout, unspecified: Secondary | ICD-10-CM | POA: Diagnosis not present

## 2021-03-15 DIAGNOSIS — E1140 Type 2 diabetes mellitus with diabetic neuropathy, unspecified: Secondary | ICD-10-CM | POA: Diagnosis not present

## 2021-03-15 DIAGNOSIS — I5033 Acute on chronic diastolic (congestive) heart failure: Secondary | ICD-10-CM | POA: Diagnosis not present

## 2021-03-15 DIAGNOSIS — E785 Hyperlipidemia, unspecified: Secondary | ICD-10-CM | POA: Diagnosis not present

## 2021-03-17 ENCOUNTER — Ambulatory Visit: Payer: Medicare Other | Admitting: Cardiology

## 2021-03-17 DIAGNOSIS — I5033 Acute on chronic diastolic (congestive) heart failure: Secondary | ICD-10-CM | POA: Diagnosis not present

## 2021-03-17 DIAGNOSIS — M109 Gout, unspecified: Secondary | ICD-10-CM | POA: Diagnosis not present

## 2021-03-17 DIAGNOSIS — E1169 Type 2 diabetes mellitus with other specified complication: Secondary | ICD-10-CM | POA: Diagnosis not present

## 2021-03-17 DIAGNOSIS — I130 Hypertensive heart and chronic kidney disease with heart failure and stage 1 through stage 4 chronic kidney disease, or unspecified chronic kidney disease: Secondary | ICD-10-CM | POA: Diagnosis not present

## 2021-03-17 DIAGNOSIS — R569 Unspecified convulsions: Secondary | ICD-10-CM | POA: Diagnosis not present

## 2021-03-17 DIAGNOSIS — E785 Hyperlipidemia, unspecified: Secondary | ICD-10-CM | POA: Diagnosis not present

## 2021-03-17 DIAGNOSIS — E876 Hypokalemia: Secondary | ICD-10-CM | POA: Diagnosis not present

## 2021-03-17 DIAGNOSIS — J449 Chronic obstructive pulmonary disease, unspecified: Secondary | ICD-10-CM | POA: Diagnosis not present

## 2021-03-17 DIAGNOSIS — I872 Venous insufficiency (chronic) (peripheral): Secondary | ICD-10-CM | POA: Diagnosis not present

## 2021-03-17 DIAGNOSIS — I2510 Atherosclerotic heart disease of native coronary artery without angina pectoris: Secondary | ICD-10-CM | POA: Diagnosis not present

## 2021-03-17 DIAGNOSIS — N183 Chronic kidney disease, stage 3 unspecified: Secondary | ICD-10-CM | POA: Diagnosis not present

## 2021-03-17 DIAGNOSIS — G4733 Obstructive sleep apnea (adult) (pediatric): Secondary | ICD-10-CM | POA: Diagnosis not present

## 2021-03-17 DIAGNOSIS — E1165 Type 2 diabetes mellitus with hyperglycemia: Secondary | ICD-10-CM | POA: Diagnosis not present

## 2021-03-17 DIAGNOSIS — E871 Hypo-osmolality and hyponatremia: Secondary | ICD-10-CM | POA: Diagnosis not present

## 2021-03-17 DIAGNOSIS — I471 Supraventricular tachycardia: Secondary | ICD-10-CM | POA: Diagnosis not present

## 2021-03-17 DIAGNOSIS — E1122 Type 2 diabetes mellitus with diabetic chronic kidney disease: Secondary | ICD-10-CM | POA: Diagnosis not present

## 2021-03-17 DIAGNOSIS — E1151 Type 2 diabetes mellitus with diabetic peripheral angiopathy without gangrene: Secondary | ICD-10-CM | POA: Diagnosis not present

## 2021-03-17 DIAGNOSIS — G4700 Insomnia, unspecified: Secondary | ICD-10-CM | POA: Diagnosis not present

## 2021-03-17 DIAGNOSIS — J9601 Acute respiratory failure with hypoxia: Secondary | ICD-10-CM | POA: Diagnosis not present

## 2021-03-17 DIAGNOSIS — E1140 Type 2 diabetes mellitus with diabetic neuropathy, unspecified: Secondary | ICD-10-CM | POA: Diagnosis not present

## 2021-03-19 DIAGNOSIS — I480 Paroxysmal atrial fibrillation: Secondary | ICD-10-CM | POA: Diagnosis not present

## 2021-03-19 DIAGNOSIS — G4700 Insomnia, unspecified: Secondary | ICD-10-CM | POA: Diagnosis not present

## 2021-03-19 DIAGNOSIS — E1165 Type 2 diabetes mellitus with hyperglycemia: Secondary | ICD-10-CM | POA: Diagnosis not present

## 2021-03-19 DIAGNOSIS — I5032 Chronic diastolic (congestive) heart failure: Secondary | ICD-10-CM | POA: Diagnosis not present

## 2021-03-19 DIAGNOSIS — J449 Chronic obstructive pulmonary disease, unspecified: Secondary | ICD-10-CM | POA: Diagnosis not present

## 2021-03-19 DIAGNOSIS — I10 Essential (primary) hypertension: Secondary | ICD-10-CM | POA: Diagnosis not present

## 2021-03-19 DIAGNOSIS — G459 Transient cerebral ischemic attack, unspecified: Secondary | ICD-10-CM | POA: Diagnosis not present

## 2021-03-19 DIAGNOSIS — E785 Hyperlipidemia, unspecified: Secondary | ICD-10-CM | POA: Diagnosis not present

## 2021-03-19 DIAGNOSIS — E1169 Type 2 diabetes mellitus with other specified complication: Secondary | ICD-10-CM | POA: Diagnosis not present

## 2021-03-19 DIAGNOSIS — I2510 Atherosclerotic heart disease of native coronary artery without angina pectoris: Secondary | ICD-10-CM | POA: Diagnosis not present

## 2021-03-19 DIAGNOSIS — M1990 Unspecified osteoarthritis, unspecified site: Secondary | ICD-10-CM | POA: Diagnosis not present

## 2021-03-22 DIAGNOSIS — M109 Gout, unspecified: Secondary | ICD-10-CM | POA: Diagnosis not present

## 2021-03-22 DIAGNOSIS — E871 Hypo-osmolality and hyponatremia: Secondary | ICD-10-CM | POA: Diagnosis not present

## 2021-03-22 DIAGNOSIS — R569 Unspecified convulsions: Secondary | ICD-10-CM | POA: Diagnosis not present

## 2021-03-22 DIAGNOSIS — G4733 Obstructive sleep apnea (adult) (pediatric): Secondary | ICD-10-CM | POA: Diagnosis not present

## 2021-03-22 DIAGNOSIS — I2510 Atherosclerotic heart disease of native coronary artery without angina pectoris: Secondary | ICD-10-CM | POA: Diagnosis not present

## 2021-03-22 DIAGNOSIS — E785 Hyperlipidemia, unspecified: Secondary | ICD-10-CM | POA: Diagnosis not present

## 2021-03-22 DIAGNOSIS — I130 Hypertensive heart and chronic kidney disease with heart failure and stage 1 through stage 4 chronic kidney disease, or unspecified chronic kidney disease: Secondary | ICD-10-CM | POA: Diagnosis not present

## 2021-03-22 DIAGNOSIS — N183 Chronic kidney disease, stage 3 unspecified: Secondary | ICD-10-CM | POA: Diagnosis not present

## 2021-03-22 DIAGNOSIS — G4700 Insomnia, unspecified: Secondary | ICD-10-CM | POA: Diagnosis not present

## 2021-03-22 DIAGNOSIS — E1165 Type 2 diabetes mellitus with hyperglycemia: Secondary | ICD-10-CM | POA: Diagnosis not present

## 2021-03-22 DIAGNOSIS — I471 Supraventricular tachycardia: Secondary | ICD-10-CM | POA: Diagnosis not present

## 2021-03-22 DIAGNOSIS — E1122 Type 2 diabetes mellitus with diabetic chronic kidney disease: Secondary | ICD-10-CM | POA: Diagnosis not present

## 2021-03-22 DIAGNOSIS — E1169 Type 2 diabetes mellitus with other specified complication: Secondary | ICD-10-CM | POA: Diagnosis not present

## 2021-03-22 DIAGNOSIS — J9601 Acute respiratory failure with hypoxia: Secondary | ICD-10-CM | POA: Diagnosis not present

## 2021-03-22 DIAGNOSIS — I872 Venous insufficiency (chronic) (peripheral): Secondary | ICD-10-CM | POA: Diagnosis not present

## 2021-03-22 DIAGNOSIS — E876 Hypokalemia: Secondary | ICD-10-CM | POA: Diagnosis not present

## 2021-03-22 DIAGNOSIS — E1151 Type 2 diabetes mellitus with diabetic peripheral angiopathy without gangrene: Secondary | ICD-10-CM | POA: Diagnosis not present

## 2021-03-22 DIAGNOSIS — I5033 Acute on chronic diastolic (congestive) heart failure: Secondary | ICD-10-CM | POA: Diagnosis not present

## 2021-03-22 DIAGNOSIS — J449 Chronic obstructive pulmonary disease, unspecified: Secondary | ICD-10-CM | POA: Diagnosis not present

## 2021-03-22 DIAGNOSIS — E1140 Type 2 diabetes mellitus with diabetic neuropathy, unspecified: Secondary | ICD-10-CM | POA: Diagnosis not present

## 2021-03-23 DIAGNOSIS — I5033 Acute on chronic diastolic (congestive) heart failure: Secondary | ICD-10-CM | POA: Diagnosis not present

## 2021-03-23 DIAGNOSIS — N183 Chronic kidney disease, stage 3 unspecified: Secondary | ICD-10-CM | POA: Diagnosis not present

## 2021-03-23 DIAGNOSIS — G4700 Insomnia, unspecified: Secondary | ICD-10-CM | POA: Diagnosis not present

## 2021-03-23 DIAGNOSIS — I872 Venous insufficiency (chronic) (peripheral): Secondary | ICD-10-CM | POA: Diagnosis not present

## 2021-03-23 DIAGNOSIS — I130 Hypertensive heart and chronic kidney disease with heart failure and stage 1 through stage 4 chronic kidney disease, or unspecified chronic kidney disease: Secondary | ICD-10-CM | POA: Diagnosis not present

## 2021-03-23 DIAGNOSIS — M109 Gout, unspecified: Secondary | ICD-10-CM | POA: Diagnosis not present

## 2021-03-23 DIAGNOSIS — I471 Supraventricular tachycardia: Secondary | ICD-10-CM | POA: Diagnosis not present

## 2021-03-23 DIAGNOSIS — E1122 Type 2 diabetes mellitus with diabetic chronic kidney disease: Secondary | ICD-10-CM | POA: Diagnosis not present

## 2021-03-23 DIAGNOSIS — E1140 Type 2 diabetes mellitus with diabetic neuropathy, unspecified: Secondary | ICD-10-CM | POA: Diagnosis not present

## 2021-03-23 DIAGNOSIS — E876 Hypokalemia: Secondary | ICD-10-CM | POA: Diagnosis not present

## 2021-03-23 DIAGNOSIS — J449 Chronic obstructive pulmonary disease, unspecified: Secondary | ICD-10-CM | POA: Diagnosis not present

## 2021-03-23 DIAGNOSIS — E1169 Type 2 diabetes mellitus with other specified complication: Secondary | ICD-10-CM | POA: Diagnosis not present

## 2021-03-23 DIAGNOSIS — E1165 Type 2 diabetes mellitus with hyperglycemia: Secondary | ICD-10-CM | POA: Diagnosis not present

## 2021-03-23 DIAGNOSIS — E871 Hypo-osmolality and hyponatremia: Secondary | ICD-10-CM | POA: Diagnosis not present

## 2021-03-23 DIAGNOSIS — E785 Hyperlipidemia, unspecified: Secondary | ICD-10-CM | POA: Diagnosis not present

## 2021-03-23 DIAGNOSIS — J9601 Acute respiratory failure with hypoxia: Secondary | ICD-10-CM | POA: Diagnosis not present

## 2021-03-23 DIAGNOSIS — I2510 Atherosclerotic heart disease of native coronary artery without angina pectoris: Secondary | ICD-10-CM | POA: Diagnosis not present

## 2021-03-23 DIAGNOSIS — R569 Unspecified convulsions: Secondary | ICD-10-CM | POA: Diagnosis not present

## 2021-03-23 DIAGNOSIS — E1151 Type 2 diabetes mellitus with diabetic peripheral angiopathy without gangrene: Secondary | ICD-10-CM | POA: Diagnosis not present

## 2021-03-23 DIAGNOSIS — G4733 Obstructive sleep apnea (adult) (pediatric): Secondary | ICD-10-CM | POA: Diagnosis not present

## 2021-03-25 ENCOUNTER — Ambulatory Visit: Payer: Medicare Other | Admitting: Neurology

## 2021-03-30 ENCOUNTER — Ambulatory Visit: Payer: Medicare Other | Admitting: Podiatry

## 2021-03-31 DIAGNOSIS — E1169 Type 2 diabetes mellitus with other specified complication: Secondary | ICD-10-CM | POA: Diagnosis not present

## 2021-03-31 DIAGNOSIS — M109 Gout, unspecified: Secondary | ICD-10-CM | POA: Diagnosis not present

## 2021-03-31 DIAGNOSIS — J9601 Acute respiratory failure with hypoxia: Secondary | ICD-10-CM | POA: Diagnosis not present

## 2021-03-31 DIAGNOSIS — I130 Hypertensive heart and chronic kidney disease with heart failure and stage 1 through stage 4 chronic kidney disease, or unspecified chronic kidney disease: Secondary | ICD-10-CM | POA: Diagnosis not present

## 2021-03-31 DIAGNOSIS — E1140 Type 2 diabetes mellitus with diabetic neuropathy, unspecified: Secondary | ICD-10-CM | POA: Diagnosis not present

## 2021-03-31 DIAGNOSIS — E785 Hyperlipidemia, unspecified: Secondary | ICD-10-CM | POA: Diagnosis not present

## 2021-03-31 DIAGNOSIS — E871 Hypo-osmolality and hyponatremia: Secondary | ICD-10-CM | POA: Diagnosis not present

## 2021-03-31 DIAGNOSIS — N183 Chronic kidney disease, stage 3 unspecified: Secondary | ICD-10-CM | POA: Diagnosis not present

## 2021-03-31 DIAGNOSIS — E876 Hypokalemia: Secondary | ICD-10-CM | POA: Diagnosis not present

## 2021-03-31 DIAGNOSIS — R569 Unspecified convulsions: Secondary | ICD-10-CM | POA: Diagnosis not present

## 2021-03-31 DIAGNOSIS — E1122 Type 2 diabetes mellitus with diabetic chronic kidney disease: Secondary | ICD-10-CM | POA: Diagnosis not present

## 2021-03-31 DIAGNOSIS — I872 Venous insufficiency (chronic) (peripheral): Secondary | ICD-10-CM | POA: Diagnosis not present

## 2021-03-31 DIAGNOSIS — I5033 Acute on chronic diastolic (congestive) heart failure: Secondary | ICD-10-CM | POA: Diagnosis not present

## 2021-03-31 DIAGNOSIS — I2510 Atherosclerotic heart disease of native coronary artery without angina pectoris: Secondary | ICD-10-CM | POA: Diagnosis not present

## 2021-03-31 DIAGNOSIS — I471 Supraventricular tachycardia: Secondary | ICD-10-CM | POA: Diagnosis not present

## 2021-03-31 DIAGNOSIS — E1151 Type 2 diabetes mellitus with diabetic peripheral angiopathy without gangrene: Secondary | ICD-10-CM | POA: Diagnosis not present

## 2021-03-31 DIAGNOSIS — E1165 Type 2 diabetes mellitus with hyperglycemia: Secondary | ICD-10-CM | POA: Diagnosis not present

## 2021-03-31 DIAGNOSIS — J449 Chronic obstructive pulmonary disease, unspecified: Secondary | ICD-10-CM | POA: Diagnosis not present

## 2021-03-31 DIAGNOSIS — G4733 Obstructive sleep apnea (adult) (pediatric): Secondary | ICD-10-CM | POA: Diagnosis not present

## 2021-03-31 DIAGNOSIS — G4700 Insomnia, unspecified: Secondary | ICD-10-CM | POA: Diagnosis not present

## 2021-04-05 DIAGNOSIS — E1165 Type 2 diabetes mellitus with hyperglycemia: Secondary | ICD-10-CM | POA: Diagnosis not present

## 2021-04-05 DIAGNOSIS — I69354 Hemiplegia and hemiparesis following cerebral infarction affecting left non-dominant side: Secondary | ICD-10-CM | POA: Diagnosis not present

## 2021-04-05 DIAGNOSIS — E1122 Type 2 diabetes mellitus with diabetic chronic kidney disease: Secondary | ICD-10-CM | POA: Diagnosis not present

## 2021-04-05 DIAGNOSIS — N183 Chronic kidney disease, stage 3 unspecified: Secondary | ICD-10-CM | POA: Diagnosis not present

## 2021-04-05 DIAGNOSIS — I872 Venous insufficiency (chronic) (peripheral): Secondary | ICD-10-CM | POA: Diagnosis not present

## 2021-04-05 DIAGNOSIS — I5033 Acute on chronic diastolic (congestive) heart failure: Secondary | ICD-10-CM | POA: Diagnosis not present

## 2021-04-05 DIAGNOSIS — I130 Hypertensive heart and chronic kidney disease with heart failure and stage 1 through stage 4 chronic kidney disease, or unspecified chronic kidney disease: Secondary | ICD-10-CM | POA: Diagnosis not present

## 2021-04-05 DIAGNOSIS — M109 Gout, unspecified: Secondary | ICD-10-CM | POA: Diagnosis not present

## 2021-04-05 DIAGNOSIS — E1140 Type 2 diabetes mellitus with diabetic neuropathy, unspecified: Secondary | ICD-10-CM | POA: Diagnosis not present

## 2021-04-05 DIAGNOSIS — E1151 Type 2 diabetes mellitus with diabetic peripheral angiopathy without gangrene: Secondary | ICD-10-CM | POA: Diagnosis not present

## 2021-04-05 DIAGNOSIS — I2510 Atherosclerotic heart disease of native coronary artery without angina pectoris: Secondary | ICD-10-CM | POA: Diagnosis not present

## 2021-04-05 DIAGNOSIS — I4819 Other persistent atrial fibrillation: Secondary | ICD-10-CM | POA: Diagnosis not present

## 2021-04-06 ENCOUNTER — Encounter: Payer: Self-pay | Admitting: Podiatry

## 2021-04-06 ENCOUNTER — Other Ambulatory Visit: Payer: Self-pay

## 2021-04-06 ENCOUNTER — Ambulatory Visit (INDEPENDENT_AMBULATORY_CARE_PROVIDER_SITE_OTHER): Payer: Medicare Other | Admitting: Podiatry

## 2021-04-06 DIAGNOSIS — M79675 Pain in left toe(s): Secondary | ICD-10-CM

## 2021-04-06 DIAGNOSIS — M79674 Pain in right toe(s): Secondary | ICD-10-CM

## 2021-04-06 DIAGNOSIS — B351 Tinea unguium: Secondary | ICD-10-CM

## 2021-04-06 DIAGNOSIS — E1142 Type 2 diabetes mellitus with diabetic polyneuropathy: Secondary | ICD-10-CM

## 2021-04-06 DIAGNOSIS — I878 Other specified disorders of veins: Secondary | ICD-10-CM

## 2021-04-06 NOTE — Progress Notes (Addendum)
This patient returns to my office for at risk foot care.  This patient requires this care by a professional since this patient will be at risk due to having diabetic neuropathy and angiopathy and venous stasis.  . This patient presents to the office with his wife.This patient is unable to cut nails himself since the patient cannot reach his nails.These nails are painful walking and wearing shoes.  This patient presents for at risk foot care today.  General Appearance  Alert, conversant and in no acute stress.  Vascular  Dorsalis pedis and posterior tibial  pulses are not  palpable  billaterally due to severe swelling feet.  Capillary return is within normal limits  bilaterally. Temperature is within normal limits  Bilaterally. Venous stasis right leg.  Neurologic  Senn-Weinstein monofilament wire test absent right foot.  Diminished left foot.. Muscle power within normal limits bilaterally.  Nails Thick disfigured discolored nails with subungual debris  from hallux to fifth toes bilaterally.  Pincer hallux nails   B/L. No evidence of bacterial infection or drainage bilaterally.  Orthopedic  No limitations of motion  feet .  No crepitus or effusions noted.  No bony pathology or digital deformities noted.  Severe HAV  B/L.  Skin  normotropic skin with no porokeratosis noted bilaterally.  No signs of infections or ulcers noted.     Onychomycosis  Pain in right toes  Pain in left toes  Consent was obtained for treatment procedures.   Mechanical debridement of nails 1-5  bilaterally performed with a nail nipper.  Filed with dremel without incident. Patient was bleeding on left leg.  Bandaged and told him to rebandage with [place] at home.   Return office visit   3 months                   Told patient to return for periodic foot care and evaluation due to potential at risk complications.   Gardiner Barefoot DPM

## 2021-04-08 ENCOUNTER — Ambulatory Visit (INDEPENDENT_AMBULATORY_CARE_PROVIDER_SITE_OTHER): Payer: Medicare Other | Admitting: Endocrinology

## 2021-04-08 ENCOUNTER — Other Ambulatory Visit: Payer: Self-pay

## 2021-04-08 VITALS — BP 106/56 | HR 85 | Ht 74.0 in | Wt 383.0 lb

## 2021-04-08 DIAGNOSIS — E1159 Type 2 diabetes mellitus with other circulatory complications: Secondary | ICD-10-CM | POA: Diagnosis not present

## 2021-04-08 DIAGNOSIS — E119 Type 2 diabetes mellitus without complications: Secondary | ICD-10-CM

## 2021-04-08 LAB — POCT GLYCOSYLATED HEMOGLOBIN (HGB A1C): Hemoglobin A1C: 8 % — AB (ref 4.0–5.6)

## 2021-04-08 MED ORDER — LANTUS SOLOSTAR 100 UNIT/ML ~~LOC~~ SOPN: 5.0000 [IU] | PEN_INJECTOR | SUBCUTANEOUS | 3 refills | Status: DC

## 2021-04-08 MED ORDER — LANTUS SOLOSTAR 100 UNIT/ML ~~LOC~~ SOPN: 18.0000 [IU] | PEN_INJECTOR | SUBCUTANEOUS | 3 refills | Status: DC

## 2021-04-08 MED ORDER — REPAGLINIDE 1 MG PO TABS: 1.0000 mg | ORAL_TABLET | Freq: Two times a day (BID) | ORAL | 3 refills | Status: DC

## 2021-04-08 NOTE — Patient Instructions (Addendum)
check your blood sugar twice a day.  vary the time of day when you check, between before the 3 meals, and at bedtime.  also check if you have symptoms of your blood sugar being too high or too low.  please keep a record of the readings and bring it to your next appointment here (or you can bring the meter itself).  You can write it on any piece of paper.  please call us sooner if your blood sugar goes below 70, or if most of your readings are over 200.  I have sent 2 prescriptions to your pharmacy, to decrease the Lantus to 5 units each morning, and to add repaglinide.   Please continue the same other diabetes medications.    Please come back for a follow-up appointment in 2-3 months.

## 2021-04-08 NOTE — Progress Notes (Signed)
Subjective:    Patient ID: Lawrence [name], male    DOB: 1956-02-08, 65 y.o.   MRN: [id number]  HPI Pt returns for f/u of diabetes mellitus: DM type: Insulin-requiring type 2 Dx'ed: 2019 Complications: PPN, CAD, and leg ulcers Therapy: insulin since 2022, Trulicity, and 2 oral meds DKA: never Severe hypoglycemia: never Pancreatitis: never Pancreatic imaging:  SDOH: memory loss limts rx Other: he takes QD insulin, at least for now Interval history: He brings his meter with his cbg's which I have reviewed today.  Cbg varies from 139-231.  No new sxs.  He takes meds as rx'ed.  Past Medical History:  Diagnosis Date   Acquired dilation of ascending aorta and aortic root (HCC)    55mm by echo 01/2021   Acute on chronic diastolic CHF (congestive heart failure) (HCC) 01/13/2018   Acute respiratory failure with hypoxia (HCC) 01/13/2018   Adenomatous colon polyp 2007   Anasarca 01/31/2017   Anemia    Anxiety    Aortic stenosis    moderate AS by echo 01/2021   Arthritis    Asthma    Bilateral lower extremity edema 01/31/2017   Bilateral lower leg cellulitis    BPH without obstruction/lower urinary tract symptoms 02/22/2017   Cellulitis 11/07/2017   Cellulitis of lower extremity    Chest pain    Chronic cough    Chronic diastolic CHF (congestive heart failure) (HCC)    Chronic venous stasis 03/07/2019   COPD (chronic obstructive pulmonary disease) (HCC)    Coronary artery calcification seen on CAT scan    Depression    Diabetic neuropathy (HCC) 09/11/2019   Hematuria 02/22/2017   History of anemia 12/26/2018   History of cardiac cath    a.  01/2018 with angiographically minimal coronary disease with  up to 40% prox LAD and 20% mid LCx and OM2 with normal LVF and normal LVEDP.[name]   History of colon polyps 08/24/2018   Hypertension    Leukocytosis 08/18/2018   Morbid obesity (HCC)    New onset type 2 diabetes mellitus (HCC) 11/08/2017   OSA (obstructive sleep apnea)    Pain due to  onychomycosis of toenails of both feet 09/11/2019   Peripheral neuropathy 02/22/2017   Physical deconditioning 02/22/2017   Primary osteoarthritis, left shoulder 03/05/2017   PTSD (post-traumatic stress disorder)    Pure hypercholesterolemia    QT prolongation 03/07/2019   Right foot pain 01/13/2018   Shortness of breath    Sinus tachycardia 03/07/2019   Sleep apnea    CPAP   Type 2 diabetes mellitus with vascular disease (HCC) 09/11/2019    Past Surgical History:  Procedure Laterality Date   ENDOVENOUS ABLATION SAPHENOUS VEIN W/ LASER Right 08/20/2020   endovenous laser ablation right greater saphenous vein by Cari Caraway MD    JOINT REPLACEMENT     left knee replacement x 2   KNEE ARTHROSCOPY Bilateral    LEFT HEART CATH AND CORONARY ANGIOGRAPHY N/A 01/17/2018   Procedure: LEFT HEART CATH AND CORONARY ANGIOGRAPHY;  Surgeon: Marykay Lex, MD;  Location: [facility] INVASIVE CV LAB;  Service: Cardiovascular;  Laterality: N/A;   UMBILICAL HERNIA REPAIR      Social History   Socioeconomic History   Marital status: Significant Other    Spouse name: Not on file   Number of children: 1   Years of education: Not on file   Highest education level: Not on file  Occupational History   Occupation: retired  Tobacco Use   Smoking  status: Former    Types: Cigarettes    Quit date: 03/07/2016    Years since quitting: 5.0   Smokeless tobacco: Never  Substance and Sexual Activity   Alcohol use: Not Currently    Comment: rare   Drug use: No   Sexual activity: Not on file  Other Topics Concern   Not on file  Social History Narrative   Not on file   Social Determinants of Health   Financial Resource Strain: Not on file  Food Insecurity: Not on file  Transportation Needs: Not on file  Physical Activity: Not on file  Stress: Not on file  Social Connections: Not on file  Intimate Partner Violence: Not on file    Current Outpatient Medications on File Prior to Visit  Medication  Sig Dispense Refill   Accu-Chek FastClix Lancets MISC 1 each by Other route in the morning and at bedtime.     acetaminophen (TYLENOL) 325 MG tablet Take 2 tablets (650 mg total) by mouth every 4 (four) hours as needed for mild pain (or temp > 37.5 C (99.5 F)).     albuterol (VENTOLIN HFA) 108 (90 Base) MCG/ACT inhaler Inhale 2 puffs into the lungs every 6 (six) hours as needed for wheezing or shortness of breath.     Alcohol Swabs (ALCOHOL PREP) 70 % PADS 1 each by Other route in the morning, at noon, and at bedtime.     allopurinol (ZYLOPRIM) 100 MG tablet Take 100 mg by mouth 2 (two) times daily.     apixaban (ELIQUIS) 5 MG TABS tablet Take 1 tablet (5 mg total) by mouth 2 (two) times daily. 180 tablet 2   ARIPiprazole (ABILIFY) 5 MG tablet Take 1 tablet (5 mg total) by mouth at bedtime. 30 tablet 2   atorvastatin (LIPITOR) 40 MG tablet TAKE ONE TABLET BY MOUTH DAILY AT 6PM 90 tablet 2   B-D ULTRAFINE III SHORT PEN 31G X 8 MM MISC 1 each by Other route See admin instructions. Use to inject insulin subcutaneously at bedtime as directed.     blood glucose meter kit and supplies Dispense based on patient and insurance preference. Use up to four times daily as directed. (FOR ICD-10 E10.9, E11.9). 1 each 0   busPIRone (BUSPAR) 5 MG tablet Take 1 tablet (5 mg total) by mouth 3 (three) times daily. 270 tablet 1   cetirizine (ZYRTEC) 10 MG tablet Take 10 mg by mouth at bedtime.      dapagliflozin propanediol (FARXIGA) 10 MG TABS tablet Take 1 tablet (10 mg total) by mouth daily before breakfast. 90 tablet 3   diltiazem (CARDIZEM CD) 180 MG 24 hr capsule Take 1 capsule (180 mg total) by mouth daily. 30 capsule 2   Dulaglutide (TRULICITY) 4.5 WR/6.0AV SOPN Inject 4.5 mg as directed once a week. 4.5 mL 3   ferrous sulfate 324 (65 Fe) MG TBEC Take 324 mg by mouth daily with breakfast.      fluticasone (FLONASE) 50 MCG/ACT nasal spray Place 2 sprays into both nostrils daily.      isosorbide mononitrate  (IMDUR) 30 MG 24 hr tablet Take 1 tablet (30 mg total) by mouth daily. 90 tablet 2   metFORMIN (GLUCOPHAGE-XR) 500 MG 24 hr tablet Take 1,000 mg by mouth 2 (two) times daily.     metoprolol succinate (TOPROL-XL) 50 MG 24 hr tablet Take 1 tablet (50 mg total) by mouth daily. Take with or immediately following a meal. 90 tablet 0   omeprazole (PRILOSEC)  20 MG capsule Take 20 mg by mouth every morning.     oxyCODONE (OXY IR/ROXICODONE) 5 MG immediate release tablet Take 5 mg by mouth every 8 (eight) hours as needed for severe pain.     potassium chloride SA (KLOR-CON) 20 MEQ tablet Take 2 tablets (40 mEq total) by mouth daily. 180 tablet 2   prazosin (MINIPRESS) 2 MG capsule Take 2 capsules (4 mg total) by mouth at bedtime. 180 capsule 2   pregabalin (LYRICA) 100 MG capsule Take 100 mg by mouth in the morning, at noon, and at bedtime.      silver sulfADIAZINE (SILVADENE) 1 % cream Apply 1 application topically See admin instructions. Apply as directed once a day as directed to bilateral legs     spironolactone (ALDACTONE) 25 MG tablet Take 1 tablet (25 mg total) by mouth daily. 90 tablet 2   temazepam (RESTORIL) 15 MG capsule TAKE ONE CAPSULE BY MOUTH EVERYDAY AT BEDTIME 30 capsule 1   torsemide (DEMADEX) 20 MG tablet Take 2 tablets (40 mg total) in the morning and 1 tablet (20 mg total) in the evening. 270 tablet 2   traZODone (DESYREL) 100 MG tablet Take 2 tablets (200 mg total) by mouth at bedtime. 180 tablet 3   venlafaxine XR (EFFEXOR-XR) 75 MG 24 hr capsule Take 3 capsules (225 mg total) by mouth daily with breakfast. 270 capsule 3   Vitamin D, Ergocalciferol, (DRISDOL) 1.25 MG (50000 UNIT) CAPS capsule Take 50,000 Units by mouth every 7 (seven) days. Mondays     No current facility-administered medications on file prior to visit.    Allergies  Allergen Reactions   Vancomycin Other (See Comments)    "Red Man Syndrome" 02/02/17: possible cause for rash under both arms   Niacin Other (See  Comments)   Niacin And Related Other (See Comments)    Red man syndrome   Tubersol [Tuberculin] Other (See Comments)    Reaction unknown   Doxycycline Rash and Other (See Comments)    Family History  Problem Relation Age of Onset   CAD Maternal Grandfather    Diabetes Other    Diabetes Mellitus II Neg Hx    Colon cancer Neg Hx    Esophageal cancer Neg Hx    Inflammatory bowel disease Neg Hx    Liver disease Neg Hx    Pancreatic cancer Neg Hx    Rectal cancer Neg Hx    Stomach cancer Neg Hx     BP (!) 106/56 (BP Location: Right Arm, Patient Position: Sitting, Cuff Size: Large)   Pulse 85   Ht 6\' 2"  (1.88 m)   Wt (!) 383 lb (173.7 kg) Comment: Pt reported  SpO2 92%   BMI 49.17 kg/m    Review of Systems     Objective:   Physical Exam VITAL SIGNS:  See vs page GENERAL: no distress.  In wheelchair Pulses: dorsalis pedis absent bilat--poss due to edema MSK: no deformity of the feet CV: 2+ bilat leg edema.   Skin: several shallow ulcers at the right ant tibial area.  normal temp on the feet, but there is hyperpigmentation of the legs and feet.  There is skin thickening and mild erythema of both legs Neuro: sensation is intact to touch on the feet, but decreased from normal.   Ext: there is bilateral onychomycosis of the toenails.    Lab Results  Component Value Date   HGBA1C 8.0 (A) 04/08/2021   Lab Results  Component Value Date  CREATININE 0.93 02/11/2021   BUN 18 02/11/2021   NA 135 02/11/2021   K 3.7 02/11/2021   CL 96 (L) 02/11/2021   CO2 28 02/11/2021   Lab Results  Component Value Date   CHOL 129 01/26/2021   HDL 30 (L) 01/26/2021   LDLCALC 50 01/26/2021   TRIG 245 (H) 01/26/2021   CHOLHDL 4.3 01/26/2021       Assessment & Plan:  type 2 DM: uncontrolled.  We discussed.  Goal is to d/c insulin  Patient Instructions  check your blood sugar twice a day.  vary the time of day when you check, between before the 3 meals, and at bedtime.  also check if  you have symptoms of your blood sugar being too high or too low.  please keep a record of the readings and bring it to your next appointment here (or you can bring the meter itself).  You can write it on any piece of paper.  please call us sooner if your blood sugar goes below 70, or if most of your readings are over 200.  I have sent 2 prescriptions to your pharmacy, to decrease the Lantus to 5 units each morning, and to add repaglinide.   Please continue the same other diabetes medications.    Please come back for a follow-up appointment in 2-3 months.

## 2021-04-12 DIAGNOSIS — E1151 Type 2 diabetes mellitus with diabetic peripheral angiopathy without gangrene: Secondary | ICD-10-CM | POA: Diagnosis not present

## 2021-04-12 DIAGNOSIS — G4700 Insomnia, unspecified: Secondary | ICD-10-CM | POA: Diagnosis not present

## 2021-04-12 DIAGNOSIS — J449 Chronic obstructive pulmonary disease, unspecified: Secondary | ICD-10-CM | POA: Diagnosis not present

## 2021-04-12 DIAGNOSIS — E1169 Type 2 diabetes mellitus with other specified complication: Secondary | ICD-10-CM | POA: Diagnosis not present

## 2021-04-12 DIAGNOSIS — G4733 Obstructive sleep apnea (adult) (pediatric): Secondary | ICD-10-CM | POA: Diagnosis not present

## 2021-04-12 DIAGNOSIS — I130 Hypertensive heart and chronic kidney disease with heart failure and stage 1 through stage 4 chronic kidney disease, or unspecified chronic kidney disease: Secondary | ICD-10-CM | POA: Diagnosis not present

## 2021-04-12 DIAGNOSIS — M109 Gout, unspecified: Secondary | ICD-10-CM | POA: Diagnosis not present

## 2021-04-12 DIAGNOSIS — I69354 Hemiplegia and hemiparesis following cerebral infarction affecting left non-dominant side: Secondary | ICD-10-CM | POA: Diagnosis not present

## 2021-04-12 DIAGNOSIS — E1122 Type 2 diabetes mellitus with diabetic chronic kidney disease: Secondary | ICD-10-CM | POA: Diagnosis not present

## 2021-04-12 DIAGNOSIS — E785 Hyperlipidemia, unspecified: Secondary | ICD-10-CM | POA: Diagnosis not present

## 2021-04-12 DIAGNOSIS — E1140 Type 2 diabetes mellitus with diabetic neuropathy, unspecified: Secondary | ICD-10-CM | POA: Diagnosis not present

## 2021-04-12 DIAGNOSIS — E1165 Type 2 diabetes mellitus with hyperglycemia: Secondary | ICD-10-CM | POA: Diagnosis not present

## 2021-04-12 DIAGNOSIS — I4819 Other persistent atrial fibrillation: Secondary | ICD-10-CM | POA: Diagnosis not present

## 2021-04-12 DIAGNOSIS — I471 Supraventricular tachycardia: Secondary | ICD-10-CM | POA: Diagnosis not present

## 2021-04-12 DIAGNOSIS — N183 Chronic kidney disease, stage 3 unspecified: Secondary | ICD-10-CM | POA: Diagnosis not present

## 2021-04-12 DIAGNOSIS — R569 Unspecified convulsions: Secondary | ICD-10-CM | POA: Diagnosis not present

## 2021-04-12 DIAGNOSIS — I2510 Atherosclerotic heart disease of native coronary artery without angina pectoris: Secondary | ICD-10-CM | POA: Diagnosis not present

## 2021-04-12 DIAGNOSIS — I5033 Acute on chronic diastolic (congestive) heart failure: Secondary | ICD-10-CM | POA: Diagnosis not present

## 2021-04-12 DIAGNOSIS — I872 Venous insufficiency (chronic) (peripheral): Secondary | ICD-10-CM | POA: Diagnosis not present

## 2021-04-14 DIAGNOSIS — I878 Other specified disorders of veins: Secondary | ICD-10-CM | POA: Diagnosis not present

## 2021-04-14 DIAGNOSIS — G63 Polyneuropathy in diseases classified elsewhere: Secondary | ICD-10-CM | POA: Diagnosis not present

## 2021-04-15 DIAGNOSIS — E785 Hyperlipidemia, unspecified: Secondary | ICD-10-CM | POA: Diagnosis not present

## 2021-04-15 DIAGNOSIS — E1122 Type 2 diabetes mellitus with diabetic chronic kidney disease: Secondary | ICD-10-CM | POA: Diagnosis not present

## 2021-04-15 DIAGNOSIS — I130 Hypertensive heart and chronic kidney disease with heart failure and stage 1 through stage 4 chronic kidney disease, or unspecified chronic kidney disease: Secondary | ICD-10-CM | POA: Diagnosis not present

## 2021-04-15 DIAGNOSIS — J449 Chronic obstructive pulmonary disease, unspecified: Secondary | ICD-10-CM | POA: Diagnosis not present

## 2021-04-15 DIAGNOSIS — R569 Unspecified convulsions: Secondary | ICD-10-CM | POA: Diagnosis not present

## 2021-04-15 DIAGNOSIS — M109 Gout, unspecified: Secondary | ICD-10-CM | POA: Diagnosis not present

## 2021-04-15 DIAGNOSIS — I2510 Atherosclerotic heart disease of native coronary artery without angina pectoris: Secondary | ICD-10-CM | POA: Diagnosis not present

## 2021-04-15 DIAGNOSIS — E1165 Type 2 diabetes mellitus with hyperglycemia: Secondary | ICD-10-CM | POA: Diagnosis not present

## 2021-04-15 DIAGNOSIS — I69354 Hemiplegia and hemiparesis following cerebral infarction affecting left non-dominant side: Secondary | ICD-10-CM | POA: Diagnosis not present

## 2021-04-15 DIAGNOSIS — I4819 Other persistent atrial fibrillation: Secondary | ICD-10-CM | POA: Diagnosis not present

## 2021-04-15 DIAGNOSIS — E1140 Type 2 diabetes mellitus with diabetic neuropathy, unspecified: Secondary | ICD-10-CM | POA: Diagnosis not present

## 2021-04-15 DIAGNOSIS — I872 Venous insufficiency (chronic) (peripheral): Secondary | ICD-10-CM | POA: Diagnosis not present

## 2021-04-15 DIAGNOSIS — N183 Chronic kidney disease, stage 3 unspecified: Secondary | ICD-10-CM | POA: Diagnosis not present

## 2021-04-15 DIAGNOSIS — I471 Supraventricular tachycardia: Secondary | ICD-10-CM | POA: Diagnosis not present

## 2021-04-15 DIAGNOSIS — G4700 Insomnia, unspecified: Secondary | ICD-10-CM | POA: Diagnosis not present

## 2021-04-15 DIAGNOSIS — E1151 Type 2 diabetes mellitus with diabetic peripheral angiopathy without gangrene: Secondary | ICD-10-CM | POA: Diagnosis not present

## 2021-04-15 DIAGNOSIS — G4733 Obstructive sleep apnea (adult) (pediatric): Secondary | ICD-10-CM | POA: Diagnosis not present

## 2021-04-15 DIAGNOSIS — E1169 Type 2 diabetes mellitus with other specified complication: Secondary | ICD-10-CM | POA: Diagnosis not present

## 2021-04-15 DIAGNOSIS — I5033 Acute on chronic diastolic (congestive) heart failure: Secondary | ICD-10-CM | POA: Diagnosis not present

## 2021-04-16 DIAGNOSIS — I480 Paroxysmal atrial fibrillation: Secondary | ICD-10-CM | POA: Diagnosis not present

## 2021-04-16 DIAGNOSIS — J449 Chronic obstructive pulmonary disease, unspecified: Secondary | ICD-10-CM | POA: Diagnosis not present

## 2021-04-16 DIAGNOSIS — G4700 Insomnia, unspecified: Secondary | ICD-10-CM | POA: Diagnosis not present

## 2021-04-16 DIAGNOSIS — E785 Hyperlipidemia, unspecified: Secondary | ICD-10-CM | POA: Diagnosis not present

## 2021-04-16 DIAGNOSIS — I2510 Atherosclerotic heart disease of native coronary artery without angina pectoris: Secondary | ICD-10-CM | POA: Diagnosis not present

## 2021-04-16 DIAGNOSIS — E1169 Type 2 diabetes mellitus with other specified complication: Secondary | ICD-10-CM | POA: Diagnosis not present

## 2021-04-16 DIAGNOSIS — G459 Transient cerebral ischemic attack, unspecified: Secondary | ICD-10-CM | POA: Diagnosis not present

## 2021-04-16 DIAGNOSIS — M1990 Unspecified osteoarthritis, unspecified site: Secondary | ICD-10-CM | POA: Diagnosis not present

## 2021-04-16 DIAGNOSIS — E1165 Type 2 diabetes mellitus with hyperglycemia: Secondary | ICD-10-CM | POA: Diagnosis not present

## 2021-04-16 DIAGNOSIS — I5032 Chronic diastolic (congestive) heart failure: Secondary | ICD-10-CM | POA: Diagnosis not present

## 2021-04-16 DIAGNOSIS — I10 Essential (primary) hypertension: Secondary | ICD-10-CM | POA: Diagnosis not present

## 2021-04-19 ENCOUNTER — Other Ambulatory Visit: Payer: Self-pay | Admitting: Adult Health

## 2021-04-19 DIAGNOSIS — G4700 Insomnia, unspecified: Secondary | ICD-10-CM

## 2021-04-19 DIAGNOSIS — F411 Generalized anxiety disorder: Secondary | ICD-10-CM

## 2021-04-19 DIAGNOSIS — F410 Panic disorder [episodic paroxysmal anxiety] without agoraphobia: Secondary | ICD-10-CM

## 2021-04-20 ENCOUNTER — Other Ambulatory Visit: Payer: Self-pay | Admitting: Endocrinology

## 2021-04-20 ENCOUNTER — Encounter: Payer: Self-pay | Admitting: Endocrinology

## 2021-04-20 DIAGNOSIS — M109 Gout, unspecified: Secondary | ICD-10-CM | POA: Diagnosis not present

## 2021-04-20 DIAGNOSIS — E1140 Type 2 diabetes mellitus with diabetic neuropathy, unspecified: Secondary | ICD-10-CM | POA: Diagnosis not present

## 2021-04-20 DIAGNOSIS — I4819 Other persistent atrial fibrillation: Secondary | ICD-10-CM | POA: Diagnosis not present

## 2021-04-20 DIAGNOSIS — E1165 Type 2 diabetes mellitus with hyperglycemia: Secondary | ICD-10-CM | POA: Diagnosis not present

## 2021-04-20 DIAGNOSIS — G4733 Obstructive sleep apnea (adult) (pediatric): Secondary | ICD-10-CM | POA: Diagnosis not present

## 2021-04-20 DIAGNOSIS — G4700 Insomnia, unspecified: Secondary | ICD-10-CM | POA: Diagnosis not present

## 2021-04-20 DIAGNOSIS — E1122 Type 2 diabetes mellitus with diabetic chronic kidney disease: Secondary | ICD-10-CM | POA: Diagnosis not present

## 2021-04-20 DIAGNOSIS — E1169 Type 2 diabetes mellitus with other specified complication: Secondary | ICD-10-CM | POA: Diagnosis not present

## 2021-04-20 DIAGNOSIS — E1151 Type 2 diabetes mellitus with diabetic peripheral angiopathy without gangrene: Secondary | ICD-10-CM | POA: Diagnosis not present

## 2021-04-20 DIAGNOSIS — J449 Chronic obstructive pulmonary disease, unspecified: Secondary | ICD-10-CM | POA: Diagnosis not present

## 2021-04-20 DIAGNOSIS — I471 Supraventricular tachycardia: Secondary | ICD-10-CM | POA: Diagnosis not present

## 2021-04-20 DIAGNOSIS — R569 Unspecified convulsions: Secondary | ICD-10-CM | POA: Diagnosis not present

## 2021-04-20 DIAGNOSIS — I5033 Acute on chronic diastolic (congestive) heart failure: Secondary | ICD-10-CM | POA: Diagnosis not present

## 2021-04-20 DIAGNOSIS — E785 Hyperlipidemia, unspecified: Secondary | ICD-10-CM | POA: Diagnosis not present

## 2021-04-20 DIAGNOSIS — I69354 Hemiplegia and hemiparesis following cerebral infarction affecting left non-dominant side: Secondary | ICD-10-CM | POA: Diagnosis not present

## 2021-04-20 DIAGNOSIS — N183 Chronic kidney disease, stage 3 unspecified: Secondary | ICD-10-CM | POA: Diagnosis not present

## 2021-04-20 DIAGNOSIS — I872 Venous insufficiency (chronic) (peripheral): Secondary | ICD-10-CM | POA: Diagnosis not present

## 2021-04-20 DIAGNOSIS — I2510 Atherosclerotic heart disease of native coronary artery without angina pectoris: Secondary | ICD-10-CM | POA: Diagnosis not present

## 2021-04-20 DIAGNOSIS — I130 Hypertensive heart and chronic kidney disease with heart failure and stage 1 through stage 4 chronic kidney disease, or unspecified chronic kidney disease: Secondary | ICD-10-CM | POA: Diagnosis not present

## 2021-04-20 MED ORDER — REPAGLINIDE 2 MG PO TABS: 2.0000 mg | ORAL_TABLET | Freq: Two times a day (BID) | ORAL | 3 refills | Status: DC

## 2021-04-20 NOTE — Telephone Encounter (Signed)
Temazepam filled on 8/17 appt is 08/26/21

## 2021-04-22 ENCOUNTER — Ambulatory Visit (INDEPENDENT_AMBULATORY_CARE_PROVIDER_SITE_OTHER): Payer: Medicare Other | Admitting: Neurology

## 2021-04-22 ENCOUNTER — Encounter: Payer: Self-pay | Admitting: Neurology

## 2021-04-22 ENCOUNTER — Other Ambulatory Visit: Payer: Self-pay

## 2021-04-22 VITALS — BP 124/75 | HR 90 | Ht 74.0 in | Wt 385.0 lb

## 2021-04-22 DIAGNOSIS — R0683 Snoring: Secondary | ICD-10-CM | POA: Diagnosis not present

## 2021-04-22 DIAGNOSIS — G4733 Obstructive sleep apnea (adult) (pediatric): Secondary | ICD-10-CM | POA: Diagnosis not present

## 2021-04-22 DIAGNOSIS — R569 Unspecified convulsions: Secondary | ICD-10-CM

## 2021-04-22 DIAGNOSIS — E1142 Type 2 diabetes mellitus with diabetic polyneuropathy: Secondary | ICD-10-CM

## 2021-04-22 MED ORDER — PREGABALIN 50 MG PO CAPS: 50.0000 mg | ORAL_CAPSULE | Freq: Three times a day (TID) | ORAL | 2 refills | Status: DC

## 2021-04-22 NOTE — Progress Notes (Signed)
[facility]  PATIENT: Lawrence [name] DOB: [birth year]  REFERRING CLINICIAN: Derek Jack, MD HISTORY FROM: Patient and spouse [name] REASON FOR VISIT: Seizure    HISTORICAL  CHIEF COMPLAINT:  Chief Complaint  Patient presents with   Seizures    New patient: internal referral for seizures Room 54, wife [name] in room    HISTORY OF PRESENT ILLNESS:  This is a 65 year old man with past medical history of obesity, obstructive sleep apnea, diastolic heart failure, hypertension, hyperlipidemia, diabetes who is presenting with seizure-like activity.  Patient stated that he was admitted in March of this year for pulmonary edema, acute hypoxic respiratory failure, and left lower extremity cellulitis.  During his admission he was noted to have seizure-like activity.  Wife who is present today mentioned that patient started having tremor like activity in the upper extremity lasted about 30 seconds.  He only had 1 episode.  He was evaluated for seizure-like activity.  He had MRI which was negative and EEG also which was negative.  He was started on Keppra 1000 mg twice daily.  Patient stated after discharge from the hospital he took the medication for 1 month and self discontinued.  Since leaving the hospital he has not had any seizure-like activity.  Denies any previous seizure.  Denies any family history of seizures, denies any seizures factor.  His MRI was negative for any acute stroke or chronic stroke .   Patient also report continued lower extremity pain and numbness.  He has a history of diabetic neuropathy and he is on pregabalin 100 mg 3 times daily.  His hemoglobin A1c is still elevated, last hemoglobin A1c was 8.  He is on both insulin and oral meds for his diabetes. Other issues discussed today he is asleep.  He had he does have a diagnosis of obstructive sleep apnea he is on a CPAP machine, reported he take he uses CPAP machine but still has been getting restful  sleep..  The patient reported last time he has a steep CPAP machine checked was about 3 years ago.  He has not seen a sleep doctor since   Handedness: Right handed   Seizure Type: Unclear,   Current frequency: Only once   Any injuries from seizures:   Seizure risk factors: No seizure risk factor  Previous ASMs: Levetiracetam   Currenty ASMs: Levetiracetam   ASMs side effects: None   Brain Images: Normal MRI   Previous EEGs: Normal EEG   OTHER MEDICAL CONDITIONS: Obesity, OSA, HTN, DM, HLD, COPD, Diabetic neuropathy, Diastolic heart failure   REVIEW OF SYSTEMS: Full 14 system review of systems performed and negative with exception of: as noted in the HPI  ALLERGIES: Allergies  Allergen Reactions   Vancomycin Other (See Comments)    "Red Man Syndrome" 02/02/17: possible cause for rash under both arms   Niacin Other (See Comments)   Niacin And Related Other (See Comments)    Red man syndrome   Tubersol [Tuberculin] Other (See Comments)    Reaction unknown   Doxycycline Rash and Other (See Comments)    HOME MEDICATIONS: Outpatient Medications Prior to Visit  Medication Sig Dispense Refill   Accu-Chek FastClix Lancets MISC 1 each by Other route in the morning and at bedtime.     acetaminophen (TYLENOL) 325 MG tablet Take 2 tablets (650 mg total) by mouth every 4 (four) hours as needed for mild pain (or temp > 37.5 C (99.5 F)).     albuterol (VENTOLIN HFA) 108 (  90 Base) MCG/ACT inhaler Inhale 2 puffs into the lungs every 6 (six) hours as needed for wheezing or shortness of breath.     Alcohol Swabs (ALCOHOL PREP) 70 % PADS 1 each by Other route in the morning, at noon, and at bedtime.     allopurinol (ZYLOPRIM) 100 MG tablet Take 100 mg by mouth 2 (two) times daily.     apixaban (ELIQUIS) 5 MG TABS tablet Take 1 tablet (5 mg total) by mouth 2 (two) times daily. 180 tablet 2   ARIPiprazole (ABILIFY) 5 MG tablet TAKE ONE TABLET BY MOUTH EVERYDAY AT BEDTIME 30 tablet 1    atorvastatin (LIPITOR) 40 MG tablet TAKE ONE TABLET BY MOUTH DAILY AT 6PM 90 tablet 2   B-D ULTRAFINE III SHORT PEN 31G X 8 MM MISC 1 each by Other route See admin instructions. Use to inject insulin subcutaneously at bedtime as directed.     blood glucose meter kit and supplies Dispense based on patient and insurance preference. Use up to four times daily as directed. (FOR ICD-10 E10.9, E11.9). 1 each 0   busPIRone (BUSPAR) 5 MG tablet TAKE ONE TABLET BY MOUTH THREE TIMES DAILY 270 tablet 1   cetirizine (ZYRTEC) 10 MG tablet Take 10 mg by mouth at bedtime.      dapagliflozin propanediol (FARXIGA) 10 MG TABS tablet Take 1 tablet (10 mg total) by mouth daily before breakfast. 90 tablet 3   diltiazem (CARDIZEM CD) 180 MG 24 hr capsule Take 1 capsule (180 mg total) by mouth daily. 30 capsule 2   Dulaglutide (TRULICITY) 4.5 JG/8.1LX SOPN Inject 4.5 mg as directed once a week. 4.5 mL 3   ferrous sulfate 324 (65 Fe) MG TBEC Take 324 mg by mouth daily with breakfast.      fluticasone (FLONASE) 50 MCG/ACT nasal spray Place 2 sprays into both nostrils daily.      insulin glargine (LANTUS SOLOSTAR) 100 UNIT/ML Solostar Pen Inject 5 Units into the skin every morning. 30 mL 3   isosorbide mononitrate (IMDUR) 30 MG 24 hr tablet Take 1 tablet (30 mg total) by mouth daily. 90 tablet 2   metFORMIN (GLUCOPHAGE-XR) 500 MG 24 hr tablet Take 1,000 mg by mouth 2 (two) times daily.     metoprolol succinate (TOPROL-XL) 50 MG 24 hr tablet Take 1 tablet (50 mg total) by mouth daily. Take with or immediately following a meal. 90 tablet 0   omeprazole (PRILOSEC) 20 MG capsule Take 20 mg by mouth every morning.     oxyCODONE (OXY IR/ROXICODONE) 5 MG immediate release tablet Take 5 mg by mouth every 8 (eight) hours as needed for severe pain.     potassium chloride SA (KLOR-CON) 20 MEQ tablet Take 2 tablets (40 mEq total) by mouth daily. 180 tablet 2   prazosin (MINIPRESS) 2 MG capsule Take 2 capsules (4 mg total) by mouth at  bedtime. 180 capsule 2   pregabalin (LYRICA) 100 MG capsule Take 100 mg by mouth in the morning, at noon, and at bedtime.      repaglinide (PRANDIN) 2 MG tablet Take 1 tablet (2 mg total) by mouth 2 (two) times daily before a meal. 180 tablet 3   silver sulfADIAZINE (SILVADENE) 1 % cream Apply 1 application topically See admin instructions. Apply as directed once a day as directed to bilateral legs     spironolactone (ALDACTONE) 25 MG tablet Take 1 tablet (25 mg total) by mouth daily. 90 tablet 2   temazepam (RESTORIL) 15 MG  capsule TAKE ONE CAPSULE BY MOUTH EVERYDAY AT BEDTIME 30 capsule 1   torsemide (DEMADEX) 20 MG tablet Take 2 tablets (40 mg total) in the morning and 1 tablet (20 mg total) in the evening. 270 tablet 2   traZODone (DESYREL) 100 MG tablet Take 2 tablets (200 mg total) by mouth at bedtime. 180 tablet 3   venlafaxine XR (EFFEXOR-XR) 75 MG 24 hr capsule Take 3 capsules (225 mg total) by mouth daily with breakfast. 270 capsule 3   Vitamin D, Ergocalciferol, (DRISDOL) 1.25 MG (50000 UNIT) CAPS capsule Take 50,000 Units by mouth every 7 (seven) days. Mondays     No facility-administered medications prior to visit.    PAST MEDICAL HISTORY: Past Medical History:  Diagnosis Date   Acquired dilation of ascending aorta and aortic root ([place])    11mm by echo 01/2021   Acute on chronic diastolic CHF (congestive heart failure) ([place]) 01/13/2018   Acute respiratory failure with hypoxia ([place]) 01/13/2018   Adenomatous colon polyp 2007   Anasarca 01/31/2017   Anemia    Anxiety    Aortic stenosis    moderate AS by echo 01/2021   Arthritis    Asthma    Bilateral lower extremity edema 01/31/2017   Bilateral lower leg cellulitis    BPH without obstruction/lower urinary tract symptoms 02/22/2017   Cellulitis 11/07/2017   Cellulitis of lower extremity    Chest pain    Chronic cough    Chronic diastolic CHF (congestive heart failure) (HCC)    Chronic venous stasis 03/07/2019   COPD  (chronic obstructive pulmonary disease) (HCC)    Coronary artery calcification seen on CAT scan    Depression    Diabetic neuropathy ([place]) 09/11/2019   Hematuria 02/22/2017   History of anemia 12/26/2018   History of cardiac cath    a.  01/2018 with angiographically minimal coronary disease with  up to 40% prox LAD and 20% mid LCx and OM2 with normal LVF and normal LVEDP.[name]   History of colon polyps 08/24/2018   Hypertension    Leukocytosis 08/18/2018   Morbid obesity ([place])    New onset type 2 diabetes mellitus ([place]) 11/08/2017   OSA (obstructive sleep apnea)    Pain due to onychomycosis of toenails of both feet 09/11/2019   Peripheral neuropathy 02/22/2017   Physical deconditioning 02/22/2017   Primary osteoarthritis, left shoulder 03/05/2017   PTSD (post-traumatic stress disorder)    Pure hypercholesterolemia    QT prolongation 03/07/2019   Right foot pain 01/13/2018   Seizures ([place])    Shortness of breath    Sinus tachycardia 03/07/2019   Sleep apnea    CPAP   Type 2 diabetes mellitus with vascular disease ([place]) 09/11/2019    PAST SURGICAL HISTORY: Past Surgical History:  Procedure Laterality Date   ENDOVENOUS ABLATION SAPHENOUS VEIN W/ LASER Right 08/20/2020   endovenous laser ablation right greater saphenous vein by Gae Gallop MD    JOINT REPLACEMENT     left knee replacement x 2   KNEE ARTHROSCOPY Bilateral    LEFT HEART CATH AND CORONARY ANGIOGRAPHY N/A 01/17/2018   Procedure: LEFT HEART CATH AND CORONARY ANGIOGRAPHY;  Surgeon: Leonie Man, MD;  Location: [facility];  Service: Cardiovascular;  Laterality: N/A;   UMBILICAL HERNIA REPAIR      FAMILY HISTORY: Family History  Problem Relation Age of Onset   CAD Maternal Grandfather    Diabetes Other    Diabetes Mellitus II Neg Hx  Colon cancer Neg Hx    Esophageal cancer Neg Hx    Inflammatory bowel disease Neg Hx    Liver disease Neg Hx    Pancreatic cancer Neg Hx    Rectal cancer Neg Hx     Stomach cancer Neg Hx     SOCIAL HISTORY: Social History   Socioeconomic History   Marital status: Significant Other    Spouse name: [name]   Number of children: 1   Years of education: Not on file   Highest education level: Not on file  Occupational History   Occupation: retired  Tobacco Use   Smoking status: Former    Types: Cigarettes    Quit date: 03/07/2016    Years since quitting: 5.1   Smokeless tobacco: Never  Substance and Sexual Activity   Alcohol use: Not Currently    Comment: rare   Drug use: No   Sexual activity: Not on file  Other Topics Concern   Not on file  Social History Narrative   Lives with wife [name]   Right Handed   Drinks 1-2 cups caffeine   Social Determinants of Health   Financial Resource Strain: Not on file  Food Insecurity: Not on file  Transportation Needs: Not on file  Physical Activity: Not on file  Stress: Not on file  Social Connections: Not on file  Intimate Partner Violence: Not on file     PHYSICAL EXAM  GENERAL EXAM/CONSTITUTIONAL: Vitals:  Vitals:   04/22/21 0745  BP: 124/75  Pulse: 90  Weight: (!) 385 lb (174.6 kg)  Height: $Remove'6\' 2"'OSmwbUk$  (1.88 m)   Body mass index is 49.43 kg/m. Wt Readings from Last 3 Encounters:  04/22/21 (!) 385 lb (174.6 kg)  04/08/21 (!) 383 lb (173.7 kg)  02/08/21 (!) 386 lb 0.4 oz (175.1 kg)   Patient is in no distress; well developed, nourished and groomed; neck is supple. He is morbidly obese  CARDIOVASCULAR: Examination of carotid arteries is normal; no carotid bruits Regular rate and rhythm, no murmurs Examination of peripheral vascular system by observation and palpation is normal  EYES: Pupils round and reactive to light, Visual fields full to confrontation, Extraocular movements intacts,   MUSCULOSKELETAL: Gait, strength, tone, movements noted in Neurologic exam below  NEUROLOGIC: MENTAL STATUS:  awake, alert, oriented to person, place and time recent and remote memory  intact normal attention and concentration language fluent, comprehension intact, naming intact fund of knowledge appropriate  CRANIAL NERVE:  2nd, 3rd, 4th, 6th - pupils equal and reactive to light, visual fields full to confrontation, extraocular muscles intact, no nystagmus 5th - facial sensation symmetric 7th - facial strength symmetric 8th - hearing intact 9th - palate elevates symmetrically, uvula midline 11th - shoulder shrug symmetric 12th - tongue protrusion midline  MOTOR:  normal bulk and tone, full strength in the BUE  SENSORY:  Decrease sensation to light touch, pinprick, and vibration up to knee bilaterally.   COORDINATION:  finger-nose-finger, fine finger movements normal   GAIT/STATION:  Deferred     DIAGNOSTIC DATA (LABS, IMAGING, TESTING) - I reviewed patient records, labs, notes, testing and imaging myself where available.  Lab Results  Component Value Date   WBC 10.4 02/11/2021   HGB 13.4 02/11/2021   HCT 42.1 02/11/2021   MCV 92.5 02/11/2021   PLT 310 02/11/2021      Component Value Date/Time   NA 135 02/11/2021 0445   NA 136 01/07/2021 0000   K 3.7 02/11/2021 0445   CL 96 (  L) 02/11/2021 0445   CO2 28 02/11/2021 0445   GLUCOSE 141 (H) 02/11/2021 0445   BUN 18 02/11/2021 0445   BUN 22 01/07/2021 0000   CREATININE 0.93 02/11/2021 0445   CALCIUM 8.7 (L) 02/11/2021 0445   PROT 8.1 01/25/2021 1819   ALBUMIN 4.1 01/25/2021 1819   AST 19 01/25/2021 1819   ALT 23 01/25/2021 1819   ALKPHOS 100 01/25/2021 1819   BILITOT 0.8 01/25/2021 1819   GFRNONAA >60 02/11/2021 0445   GFRAA 77 05/20/2020 1038   Lab Results  Component Value Date   CHOL 129 01/26/2021   HDL 30 (L) 01/26/2021   LDLCALC 50 01/26/2021   TRIG 245 (H) 01/26/2021   Lab Results  Component Value Date   HGBA1C 8.0 (A) 04/08/2021   Lab Results  Component Value Date   VITAMINB12 225 08/22/2018   Lab Results  Component Value Date   TSH 1.442 02/09/2021    MRI Brain  02/08/2021 No evidence of recent infarction, hemorrhage, or mass. No significant change since recent prior study.  EEG 01/26/2021: Normal routine EEG   I personally reviewed brain Images and previous EEG reports.   ASSESSMENT AND PLAN  65 y.o. year old male  with morbid obesity, hypertension, hyperlipidemia, diabetes mellitus type 2, diabetes neuropathy, diastolic heart failure who is presenting after seizure-like activity.  Patient was admitted in March for lower extremity cellulitis, fluid overload, hypoxic respiratory failure and during his admission he was noted to have seizure-like activity.  Wife reported tremors like activity in the upper extremities.  He only had one episode.  He had a EEG and MRI which were both negative.  After discharge from the hospital he took [place] for 1 month then discontinued it.  No additional seizure-like activity noted since discharge from the hospital.  They also denies any previous seizure-like activity, denies any history of stroke, denies any seizure risk factor denies any family history of seizure.  At this point I think is reasonable to stop the Keppra and to continue monitoring patient.  I advised him against driving and he mentioned that he is not driving at the moment.  Return precautions given.   For his sleep apnea and snoring, he noted that the last time he had a test was more than 3 years at the [place].  He still uses BiPAP machine but feels like he is not rested.  He does have daytime somnolence and has not seen a sleep doctor for more than 3 years and he is interested in seeing a new sleep doctor.  I will refer him to one of our colleagues at [facility] for sleep evaluation. For his diabetic neuropathy he is already on pregabalin 100 mg 3 times daily but still getting numbness and pain.  His hemoglobin A1c is still not well controlled, last one was at 8.  I will increase the pregabalin to 150 mg 3 times daily and I also informed the patient that the  pain and the numbness might get worse while we control his diabetes.  At that time he might take additional dose of the Pregabalin  as tolerated. Return if symptoms are worse or any other concerns otherwise follow-up with your primary care doctor    1. Diabetic peripheral neuropathy (HCC)   2. Seizure-like activity ([place])   3. OSA treated with BiPAP   4. Snoring   5. Insulin Neuritis    PLAN: Increase Pregabalin (Lyrica) to 150 mg three times a day  Follow up with  your primary care physician  Return if worse    Per [facility] statutes, patients with seizures are not allowed to drive until they have been seizure-free for six months.  Other recommendations include using caution when using heavy equipment or power tools. Avoid working on ladders or at heights. Take showers instead of baths.  Do not swim alone.  Ensure the water temperature is not too high on the home water heater. Do not go swimming alone. Do not lock yourself in a room alone (i.e. bathroom). When caring for infants or small children, sit down when holding, feeding, or changing them to minimize risk of injury to the child in the event you have a seizure. Maintain good sleep hygiene. Avoid alcohol.  Also recommend adequate sleep, hydration, good diet and minimize stress.   During the Seizure  - First, ensure adequate ventilation and place patients on the floor on their left side  Loosen clothing around the neck and ensure the airway is patent. If the patient is clenching the teeth, do not force the mouth open with any object as this can cause severe damage - Remove all items from the surrounding that can be hazardous. The patient may be oblivious to what's happening and may not even know what he or she is doing. If the patient is confused and wandering, either gently guide him/her away and block access to outside areas - Reassure the individual and be comforting - Call 911. In most cases, the seizure ends before EMS  arrives. However, there are cases when seizures may last over 3 to 5 minutes. Or the individual may have developed breathing difficulties or severe injuries. If a pregnant patient or a person with diabetes develops a seizure, it is prudent to call an ambulance. - Finally, if the patient does not regain full consciousness, then call EMS. Most patients will remain confused for about 45 to 90 minutes after a seizure, so you must use judgment in calling for help. - Avoid restraints but make sure the patient is in a bed with padded side rails - Place the individual in a lateral position with the neck slightly flexed; this will help the saliva drain from the mouth and prevent the tongue from falling backward - Remove all nearby furniture and other hazards from the area - Provide verbal assurance as the individual is regaining consciousness - Provide the patient with privacy if possible - Call for help and start treatment as ordered by the caregiver   After the Seizure (Postictal Stage)  After a seizure, most patients experience confusion, fatigue, muscle pain and/or a headache. Thus, one should permit the individual to sleep. For the next few days, reassurance is essential. Being calm and helping reorient the person is also of importance.  Most seizures are painless and end spontaneously. Seizures are not harmful to others but can lead to complications such as stress on the lungs, brain and the heart. Individuals with prior lung problems may develop labored breathing and respiratory distress.     Orders Placed This Encounter  Procedures   Ambulatory referral to Neurology     Meds ordered this encounter  Medications   pregabalin (LYRICA) 50 MG capsule    Sig: Take 1 capsule (50 mg total) by mouth 3 (three) times daily.    Dispense:  90 capsule    Refill:  2    Return if symptoms worsen or fail to improve.    Alric Ran, MD 04/22/2021, 8:57 AM  [facility]  [place], [place], [place] [address] [phone]

## 2021-04-22 NOTE — Patient Instructions (Signed)
Increase Lyrica to 150 mg three times a day  Follow up with your primary care physician  Return if worse

## 2021-04-26 ENCOUNTER — Telehealth: Payer: Self-pay | Admitting: *Deleted

## 2021-04-26 ENCOUNTER — Observation Stay (HOSPITAL_COMMUNITY)
Admission: EM | Admit: 2021-04-26 | Discharge: 2021-04-28 | Disposition: A | Discharge status: Home / Self Care | Payer: Medicare Other | Attending: Emergency Medicine | Admitting: Emergency Medicine

## 2021-04-26 DIAGNOSIS — R569 Unspecified convulsions: Principal | ICD-10-CM | POA: Insufficient documentation

## 2021-04-26 DIAGNOSIS — I480 Paroxysmal atrial fibrillation: Secondary | ICD-10-CM | POA: Insufficient documentation

## 2021-04-26 DIAGNOSIS — Z96652 Presence of left artificial knee joint: Secondary | ICD-10-CM | POA: Insufficient documentation

## 2021-04-26 DIAGNOSIS — E119 Type 2 diabetes mellitus without complications: Secondary | ICD-10-CM | POA: Diagnosis not present

## 2021-04-26 DIAGNOSIS — E66813 Obesity, class 3: Secondary | ICD-10-CM | POA: Diagnosis present

## 2021-04-26 DIAGNOSIS — J9811 Atelectasis: Secondary | ICD-10-CM | POA: Diagnosis not present

## 2021-04-26 DIAGNOSIS — I509 Heart failure, unspecified: Secondary | ICD-10-CM

## 2021-04-26 DIAGNOSIS — Z20822 Contact with and (suspected) exposure to covid-19: Secondary | ICD-10-CM | POA: Diagnosis not present

## 2021-04-26 DIAGNOSIS — Z79899 Other long term (current) drug therapy: Secondary | ICD-10-CM | POA: Diagnosis not present

## 2021-04-26 DIAGNOSIS — Z87891 Personal history of nicotine dependence: Secondary | ICD-10-CM | POA: Diagnosis not present

## 2021-04-26 DIAGNOSIS — J45909 Unspecified asthma, uncomplicated: Secondary | ICD-10-CM | POA: Insufficient documentation

## 2021-04-26 DIAGNOSIS — I499 Cardiac arrhythmia, unspecified: Secondary | ICD-10-CM | POA: Diagnosis not present

## 2021-04-26 DIAGNOSIS — I5033 Acute on chronic diastolic (congestive) heart failure: Secondary | ICD-10-CM | POA: Insufficient documentation

## 2021-04-26 DIAGNOSIS — J449 Chronic obstructive pulmonary disease, unspecified: Secondary | ICD-10-CM | POA: Insufficient documentation

## 2021-04-26 DIAGNOSIS — E7800 Pure hypercholesterolemia, unspecified: Secondary | ICD-10-CM | POA: Diagnosis present

## 2021-04-26 DIAGNOSIS — I10 Essential (primary) hypertension: Secondary | ICD-10-CM | POA: Diagnosis present

## 2021-04-26 DIAGNOSIS — R0600 Dyspnea, unspecified: Secondary | ICD-10-CM | POA: Diagnosis not present

## 2021-04-26 DIAGNOSIS — R2990 Unspecified symptoms and signs involving the nervous system: Secondary | ICD-10-CM | POA: Diagnosis present

## 2021-04-26 DIAGNOSIS — R531 Weakness: Secondary | ICD-10-CM | POA: Diagnosis present

## 2021-04-26 DIAGNOSIS — I5032 Chronic diastolic (congestive) heart failure: Secondary | ICD-10-CM | POA: Diagnosis present

## 2021-04-26 DIAGNOSIS — E669 Obesity, unspecified: Secondary | ICD-10-CM | POA: Diagnosis present

## 2021-04-26 DIAGNOSIS — R4781 Slurred speech: Secondary | ICD-10-CM | POA: Diagnosis present

## 2021-04-26 DIAGNOSIS — Z7901 Long term (current) use of anticoagulants: Secondary | ICD-10-CM | POA: Insufficient documentation

## 2021-04-26 DIAGNOSIS — I639 Cerebral infarction, unspecified: Secondary | ICD-10-CM | POA: Diagnosis not present

## 2021-04-26 DIAGNOSIS — Z743 Need for continuous supervision: Secondary | ICD-10-CM | POA: Diagnosis not present

## 2021-04-26 DIAGNOSIS — R5383 Other fatigue: Secondary | ICD-10-CM | POA: Diagnosis not present

## 2021-04-26 DIAGNOSIS — Y900 Blood alcohol level of less than 20 mg/100 ml: Secondary | ICD-10-CM | POA: Diagnosis not present

## 2021-04-26 DIAGNOSIS — G319 Degenerative disease of nervous system, unspecified: Secondary | ICD-10-CM | POA: Diagnosis not present

## 2021-04-26 DIAGNOSIS — I110 Hypertensive heart disease with heart failure: Secondary | ICD-10-CM | POA: Insufficient documentation

## 2021-04-26 DIAGNOSIS — R262 Difficulty in walking, not elsewhere classified: Secondary | ICD-10-CM | POA: Diagnosis present

## 2021-04-26 DIAGNOSIS — G4733 Obstructive sleep apnea (adult) (pediatric): Secondary | ICD-10-CM | POA: Diagnosis present

## 2021-04-26 DIAGNOSIS — E1169 Type 2 diabetes mellitus with other specified complication: Secondary | ICD-10-CM | POA: Diagnosis present

## 2021-04-26 DIAGNOSIS — I517 Cardiomegaly: Secondary | ICD-10-CM | POA: Diagnosis not present

## 2021-04-26 HISTORY — DX: Chronic diastolic (congestive) heart failure: I50.32

## 2021-04-26 NOTE — ED Triage Notes (Signed)
Pt here via [facility] from home for generalized weakness and increased fatigue after receiving covid and flu shot yesterday. Pt 89% on RA, 97% on 4L via nasal cannula, 111/72, CBG 183

## 2021-04-26 NOTE — Telephone Encounter (Signed)
Received the following mychart message from patient:  I seem to fade out while watching TV. Any advice?  ___________________________________  I called the patient to get more information. Says he frequently nods off to sleep and "fades out" while watching TV or while playing games on his tablet. Occurs nearly daily and often multiple times. Rarely skips a day. He had difficulty placing the events into words. Sometimes he is "out of it" for several minutes and sometimes it may be for extended periods of time. Occasionally, he feels confused for a few seconds when he wakes up. However, he is able to quickly orient himself back to time and place. Says these events really do not happen outside of watching TV and using his iPad. His wife did not have any further information to add. He is awaiting his sleep consultation. No longer on Keppra. Confirmed he is not driving. He would like Dr. Jabier Mutton advice and is okay with our office calling him back on 04/28/21. States he forgot to discuss this at his recent visit.

## 2021-04-27 ENCOUNTER — Encounter (HOSPITAL_COMMUNITY): Payer: Self-pay

## 2021-04-27 ENCOUNTER — Emergency Department (HOSPITAL_COMMUNITY): Payer: Medicare Other

## 2021-04-27 ENCOUNTER — Observation Stay (HOSPITAL_COMMUNITY): Payer: Medicare Other

## 2021-04-27 ENCOUNTER — Other Ambulatory Visit: Payer: Self-pay

## 2021-04-27 DIAGNOSIS — R262 Difficulty in walking, not elsewhere classified: Secondary | ICD-10-CM | POA: Diagnosis not present

## 2021-04-27 DIAGNOSIS — I517 Cardiomegaly: Secondary | ICD-10-CM | POA: Diagnosis not present

## 2021-04-27 DIAGNOSIS — G319 Degenerative disease of nervous system, unspecified: Secondary | ICD-10-CM | POA: Diagnosis not present

## 2021-04-27 DIAGNOSIS — J9811 Atelectasis: Secondary | ICD-10-CM | POA: Diagnosis not present

## 2021-04-27 DIAGNOSIS — R569 Unspecified convulsions: Secondary | ICD-10-CM | POA: Diagnosis not present

## 2021-04-27 DIAGNOSIS — I110 Hypertensive heart disease with heart failure: Secondary | ICD-10-CM | POA: Diagnosis not present

## 2021-04-27 DIAGNOSIS — R4781 Slurred speech: Secondary | ICD-10-CM

## 2021-04-27 DIAGNOSIS — I509 Heart failure, unspecified: Secondary | ICD-10-CM | POA: Diagnosis not present

## 2021-04-27 DIAGNOSIS — I639 Cerebral infarction, unspecified: Secondary | ICD-10-CM | POA: Diagnosis not present

## 2021-04-27 DIAGNOSIS — R2990 Unspecified symptoms and signs involving the nervous system: Secondary | ICD-10-CM | POA: Diagnosis present

## 2021-04-27 DIAGNOSIS — R0600 Dyspnea, unspecified: Secondary | ICD-10-CM | POA: Diagnosis not present

## 2021-04-27 LAB — URINALYSIS, ROUTINE W REFLEX MICROSCOPIC
Bilirubin Urine: NEGATIVE
Glucose, UA: >=500 mg/dL — AB
Hgb urine dipstick: NEGATIVE
Ketones, ur: NEGATIVE mg/dL
Leukocytes,Ua: NEGATIVE
Nitrite: NEGATIVE
Protein, ur: NEGATIVE mg/dL
Specific Gravity, Urine: 1.01 (ref 1.005–1.030)
pH: 6 (ref 5.0–8.0)

## 2021-04-27 LAB — I-STAT CHEM 8, ED
BUN: 25 mg/dL — ABNORMAL HIGH (ref 8–23)
Calcium, Ion: 1.08 mmol/L — ABNORMAL LOW (ref 1.15–1.40)
Chloride: 99 mmol/L (ref 98–111)
Creatinine, Ser: 0.9 mg/dL (ref 0.61–1.24)
Glucose, Bld: 159 mg/dL — ABNORMAL HIGH (ref 70–99)
HCT: 45 % (ref 39.0–52.0)
Hemoglobin: 15.3 g/dL (ref 13.0–17.0)
Potassium: 4 mmol/L (ref 3.5–5.1)
Sodium: 137 mmol/L (ref 135–145)
TCO2: 31 mmol/L (ref 22–32)

## 2021-04-27 LAB — CBC
HCT: 44.7 % (ref 39.0–52.0)
Hemoglobin: 14.8 g/dL (ref 13.0–17.0)
MCH: 30.2 pg (ref 26.0–34.0)
MCHC: 33.1 g/dL (ref 30.0–36.0)
MCV: 91.2 fL (ref 80.0–100.0)
Platelets: 272 K/uL (ref 150–400)
RBC: 4.9 MIL/uL (ref 4.22–5.81)
RDW: 15.1 % (ref 11.5–15.5)
WBC: 13.3 K/uL — ABNORMAL HIGH (ref 4.0–10.5)
nRBC: 0 % (ref 0.0–0.2)

## 2021-04-27 LAB — URINALYSIS, MICROSCOPIC (REFLEX): Bacteria, UA: NONE SEEN

## 2021-04-27 LAB — RAPID URINE DRUG SCREEN, HOSP PERFORMED
Amphetamines: NOT DETECTED
Barbiturates: NOT DETECTED
Benzodiazepines: POSITIVE — AB
Cocaine: NOT DETECTED
Opiates: POSITIVE — AB
Tetrahydrocannabinol: NOT DETECTED

## 2021-04-27 LAB — COMPREHENSIVE METABOLIC PANEL
ALT: 31 U/L (ref 0–44)
AST: 25 U/L (ref 15–41)
Albumin: 3.7 g/dL (ref 3.5–5.0)
Alkaline Phosphatase: 86 U/L (ref 38–126)
Anion gap: 11 (ref 5–15)
BUN: 21 mg/dL (ref 8–23)
CO2: 27 mmol/L (ref 22–32)
Calcium: 9.1 mg/dL (ref 8.9–10.3)
Chloride: 97 mmol/L — ABNORMAL LOW (ref 98–111)
Creatinine, Ser: 0.97 mg/dL (ref 0.61–1.24)
GFR, Estimated: >60 mL/min (ref >60)
Glucose, Bld: 163 mg/dL — ABNORMAL HIGH (ref 70–99)
Potassium: 4 mmol/L (ref 3.5–5.1)
Sodium: 135 mmol/L (ref 135–145)
Total Bilirubin: 0.6 mg/dL (ref 0.3–1.2)
Total Protein: 7.2 g/dL (ref 6.5–8.1)

## 2021-04-27 LAB — RESP PANEL BY RT-PCR (FLU A&B, COVID) ARPGX2
Influenza A by PCR: NEGATIVE
Influenza B by PCR: NEGATIVE
SARS Coronavirus 2 by RT PCR: NEGATIVE

## 2021-04-27 LAB — PROTIME-INR
INR: 1 (ref 0.8–1.2)
Prothrombin Time: 13.4 seconds (ref 11.4–15.2)

## 2021-04-27 LAB — CBG MONITORING, ED
Glucose-Capillary: 175 mg/dL — ABNORMAL HIGH (ref 70–99)
Glucose-Capillary: 177 mg/dL — ABNORMAL HIGH (ref 70–99)

## 2021-04-27 LAB — APTT: aPTT: 29 s (ref 24–36)

## 2021-04-27 LAB — DIFFERENTIAL
Abs Immature Granulocytes: 0.09 K/uL — ABNORMAL HIGH (ref 0.00–0.07)
Basophils Absolute: 0 K/uL (ref 0.0–0.1)
Basophils Relative: 0 %
Eosinophils Absolute: 0.4 K/uL (ref 0.0–0.5)
Eosinophils Relative: 3 %
Immature Granulocytes: 1 %
Lymphocytes Relative: 8 %
Lymphs Abs: 1.1 K/uL (ref 0.7–4.0)
Monocytes Absolute: 0.6 K/uL (ref 0.1–1.0)
Monocytes Relative: 4 %
Neutro Abs: 11.1 K/uL — ABNORMAL HIGH (ref 1.7–7.7)
Neutrophils Relative %: 84 %

## 2021-04-27 LAB — HEMOGLOBIN A1C
Hgb A1c MFr Bld: 7.8 % — ABNORMAL HIGH (ref 4.8–5.6)
Mean Plasma Glucose: 177.16 mg/dL

## 2021-04-27 LAB — ETHANOL: Alcohol, Ethyl (B): <10 mg/dL (ref <10)

## 2021-04-27 LAB — BRAIN NATRIURETIC PEPTIDE: B Natriuretic Peptide: 19.2 pg/mL (ref 0.0–100.0)

## 2021-04-27 MED ORDER — PREGABALIN 25 MG PO CAPS
150.0000 mg | ORAL_CAPSULE | Freq: Three times a day (TID) | ORAL | Status: DC
  Administered 2021-04-27 – 2021-04-28 (×4): 150 mg via ORAL
  Filled 2021-04-27 (×4): qty 6

## 2021-04-27 MED ORDER — FUROSEMIDE 10 MG/ML IJ SOLN
80.0000 mg | Freq: Once | INTRAMUSCULAR | Status: AC
  Administered 2021-04-27: 80 mg via INTRAVENOUS
  Filled 2021-04-27: qty 8

## 2021-04-27 MED ORDER — POTASSIUM CHLORIDE CRYS ER 20 MEQ PO TBCR
40.0000 meq | EXTENDED_RELEASE_TABLET | Freq: Every day | ORAL | Status: DC
  Administered 2021-04-27 – 2021-04-28 (×2): 40 meq via ORAL
  Filled 2021-04-27 (×2): qty 2

## 2021-04-27 MED ORDER — PRAZOSIN HCL 2 MG PO CAPS
4.0000 mg | ORAL_CAPSULE | Freq: Every day | ORAL | Status: DC
  Administered 2021-04-28: 4 mg via ORAL
  Filled 2021-04-27 (×2): qty 2

## 2021-04-27 MED ORDER — METOPROLOL SUCCINATE ER 25 MG PO TB24
50.0000 mg | ORAL_TABLET | Freq: Every day | ORAL | Status: DC
  Administered 2021-04-27 – 2021-04-28 (×2): 50 mg via ORAL
  Filled 2021-04-27 (×2): qty 2

## 2021-04-27 MED ORDER — PREGABALIN 25 MG PO CAPS: 50.0000 mg | ORAL_CAPSULE | Freq: Three times a day (TID) | ORAL | Status: DC

## 2021-04-27 MED ORDER — ALLOPURINOL 100 MG PO TABS
100.0000 mg | ORAL_TABLET | Freq: Two times a day (BID) | ORAL | Status: DC
  Administered 2021-04-27 – 2021-04-28 (×3): 100 mg via ORAL
  Filled 2021-04-27 (×4): qty 1

## 2021-04-27 MED ORDER — TEMAZEPAM 15 MG PO CAPS
15.0000 mg | ORAL_CAPSULE | Freq: Every day | ORAL | Status: DC
  Administered 2021-04-28: 15 mg via ORAL
  Filled 2021-04-27: qty 2

## 2021-04-27 MED ORDER — ATORVASTATIN CALCIUM 40 MG PO TABS
40.0000 mg | ORAL_TABLET | Freq: Every evening | ORAL | Status: DC
  Administered 2021-04-27: 40 mg via ORAL
  Filled 2021-04-27: qty 1

## 2021-04-27 MED ORDER — APIXABAN 5 MG PO TABS
5.0000 mg | ORAL_TABLET | Freq: Two times a day (BID) | ORAL | Status: DC
  Administered 2021-04-27 – 2021-04-28 (×3): 5 mg via ORAL
  Filled 2021-04-27 (×3): qty 1

## 2021-04-27 MED ORDER — SENNOSIDES-DOCUSATE SODIUM 8.6-50 MG PO TABS: 1.0000 | ORAL_TABLET | Freq: Every evening | ORAL | Status: DC | PRN

## 2021-04-27 MED ORDER — ACETAMINOPHEN 650 MG RE SUPP: 650.0000 mg | RECTAL | Status: DC | PRN

## 2021-04-27 MED ORDER — ACETAMINOPHEN 160 MG/5ML PO SOLN: 650.0000 mg | ORAL | Status: DC | PRN

## 2021-04-27 MED ORDER — LORATADINE 10 MG PO TABS
10.0000 mg | ORAL_TABLET | Freq: Every day | ORAL | Status: DC
  Administered 2021-04-27 – 2021-04-28 (×2): 10 mg via ORAL
  Filled 2021-04-27 (×2): qty 1

## 2021-04-27 MED ORDER — ARIPIPRAZOLE 10 MG PO TABS
5.0000 mg | ORAL_TABLET | Freq: Every day | ORAL | Status: DC
  Administered 2021-04-28: 5 mg via ORAL
  Filled 2021-04-27: qty 1

## 2021-04-27 MED ORDER — FLUTICASONE PROPIONATE 50 MCG/ACT NA SUSP
2.0000 | Freq: Every day | NASAL | Status: DC
  Filled 2021-04-27: qty 16

## 2021-04-27 MED ORDER — TRAZODONE HCL 50 MG PO TABS
200.0000 mg | ORAL_TABLET | Freq: Every day | ORAL | Status: DC
  Administered 2021-04-28: 200 mg via ORAL
  Filled 2021-04-27: qty 4

## 2021-04-27 MED ORDER — TORSEMIDE 20 MG PO TABS
40.0000 mg | ORAL_TABLET | Freq: Two times a day (BID) | ORAL | Status: DC
  Administered 2021-04-27 – 2021-04-28 (×2): 40 mg via ORAL
  Filled 2021-04-27 (×2): qty 2

## 2021-04-27 MED ORDER — DILTIAZEM HCL ER COATED BEADS 180 MG PO CP24
180.0000 mg | ORAL_CAPSULE | Freq: Every day | ORAL | Status: DC
  Administered 2021-04-27 – 2021-04-28 (×2): 180 mg via ORAL
  Filled 2021-04-27 (×2): qty 1

## 2021-04-27 MED ORDER — BUSPIRONE HCL 10 MG PO TABS
5.0000 mg | ORAL_TABLET | Freq: Three times a day (TID) | ORAL | Status: DC
  Administered 2021-04-27 – 2021-04-28 (×4): 5 mg via ORAL
  Filled 2021-04-27 (×4): qty 1

## 2021-04-27 MED ORDER — ALBUTEROL SULFATE (2.5 MG/3ML) 0.083% IN NEBU: 3.0000 mL | INHALATION_SOLUTION | Freq: Four times a day (QID) | RESPIRATORY_TRACT | Status: DC | PRN

## 2021-04-27 MED ORDER — INSULIN ASPART 100 UNIT/ML IJ SOLN
0.0000 [IU] | Freq: Three times a day (TID) | INTRAMUSCULAR | Status: DC
  Administered 2021-04-27 – 2021-04-28 (×3): 3 [IU] via SUBCUTANEOUS
  Administered 2021-04-28: 2 [IU] via SUBCUTANEOUS

## 2021-04-27 MED ORDER — INSULIN GLARGINE-YFGN 100 UNIT/ML ~~LOC~~ SOLN
5.0000 [IU] | Freq: Every day | SUBCUTANEOUS | Status: DC
  Administered 2021-04-27 – 2021-04-28 (×2): 5 [IU] via SUBCUTANEOUS
  Filled 2021-04-27 (×2): qty 0.05

## 2021-04-27 MED ORDER — ACETAMINOPHEN 325 MG PO TABS: 650.0000 mg | ORAL_TABLET | ORAL | Status: DC | PRN

## 2021-04-27 MED ORDER — PANTOPRAZOLE SODIUM 40 MG PO TBEC
40.0000 mg | DELAYED_RELEASE_TABLET | Freq: Every day | ORAL | Status: DC
  Administered 2021-04-27 – 2021-04-28 (×2): 40 mg via ORAL
  Filled 2021-04-27 (×2): qty 1

## 2021-04-27 MED ORDER — ACETAMINOPHEN 325 MG PO TABS
650.0000 mg | ORAL_TABLET | Freq: Once | ORAL | Status: AC
  Administered 2021-04-27: 650 mg via ORAL
  Filled 2021-04-27: qty 2

## 2021-04-27 MED ORDER — OXYCODONE HCL 5 MG PO TABS
10.0000 mg | ORAL_TABLET | Freq: Three times a day (TID) | ORAL | Status: DC | PRN
Start: 2021-04-27 — End: 2021-04-28
  Administered 2021-04-27: 10 mg via ORAL
  Filled 2021-04-27 (×2): qty 2

## 2021-04-27 MED ORDER — VENLAFAXINE HCL ER 75 MG PO CP24
225.0000 mg | ORAL_CAPSULE | Freq: Every day | ORAL | Status: DC
  Administered 2021-04-27 – 2021-04-28 (×2): 225 mg via ORAL
  Filled 2021-04-27 (×2): qty 1

## 2021-04-27 MED ORDER — ISOSORBIDE MONONITRATE ER 30 MG PO TB24
30.0000 mg | ORAL_TABLET | Freq: Every day | ORAL | Status: DC
  Administered 2021-04-27 – 2021-04-28 (×2): 30 mg via ORAL
  Filled 2021-04-27 (×2): qty 1

## 2021-04-27 MED ORDER — SPIRONOLACTONE 25 MG PO TABS
25.0000 mg | ORAL_TABLET | Freq: Every day | ORAL | Status: DC
  Administered 2021-04-27 – 2021-04-28 (×2): 25 mg via ORAL
  Filled 2021-04-27 (×2): qty 1

## 2021-04-27 MED ORDER — OXYCODONE-ACETAMINOPHEN 5-325 MG PO TABS
2.0000 | ORAL_TABLET | Freq: Once | ORAL | Status: AC
  Administered 2021-04-27: 2 via ORAL
  Filled 2021-04-27: qty 2

## 2021-04-27 NOTE — Code Documentation (Signed)
Responded to Code Stroke called on pt already in the ED at [place] for slurred speech, LSN-1500. CBG-163, NIH-6 for slurred speech, dysarthria, L sided weakness and sensory deficit. CT head negative for acute changes. TNK not given-pt is on eliquis. Pt taken to MRI at 0057. MRI-negative for stroke. Plan for EEG and tx for complicated migraine.

## 2021-04-27 NOTE — Progress Notes (Signed)
EEG Completed; Results Pending  

## 2021-04-27 NOTE — Consult Note (Signed)
Neurology Consultation Reason for Consult: Code stroke Requesting Physician: Malachy Moan  CC: Left-sided weakness and slurred speech  History is obtained from patient and chart review  HPI: Lawrence [name] is a 65 y.o. male with a past medical history significant for possible seizures, atrial fibrillation on anticoagulation, hypertension, hyperlipidemia, diabetes complicated by neuropathy, morbid obesity (BMI is 49.43), obstructive sleep apnea on CPAP, COPD on home O2, aortic stenosis, PTSD, polypharmacy.  Wife notes that he was in his normal mental status at 2:30 PM but then at 3 PM he began to have some slurred speech.  He took his medications and went for a nap.  He woke up and was eating dinner and subsequently but still had significant slurred speech.  He also had some mild shaking of the right upper extremity, not as severe as prior episode in March.  When he went down to go back to sleep she noted he was having some left-sided weakness for which she activated EMS.  He notes that he currently has some trails behind his hand when he moves it around on the left side of his vision as well as a 2/10 headache that is dull in nature not associated with any nausea, vomiting, light sensitivity, sound sensitivity.  Unknown he has had multiple similar presentations (see neurology notes from 6/20 as well as 7/04 admissions, at which time he was noted to have had at least 4 total episodes).  He denies headaches with these prior episodes.  Extensive work-up was completed including multiple EEGs, MRI including MRI cervical spine as detailed below and recommendation was for outpatient EMG/nerve conduction study with no clear etiology of the episodes of weakness.  He additionally had an admission 3/17 with shaking episodes concerning for seizure.  At that time he was started on Keppra with a plan to wean this outpatient.  Patient and family's understanding was that they were just supposed to stop the medication  outpatient and they did so without recurrence of events.  Therefore on neurology follow-up 9/15, the medication was not resumed.  Of note he is on multiple sedating medications at home including Abilify (for his PTSD), oxycodone 10 mg 3 times daily which he essentially takes daily, Lyrica recently increased from 100 3 times daily to 150 3 times daily on neurology follow-up 9/15, temazepam 15 nightly, trazodone 200 nightly.  Wife notes that he falls asleep easily throughout the day and nods off.  Patient notes that he frequently wakes up in the middle of the night unable to sleep again.  LKW: 2:30 PM tPA given?: No, on anticoagulation IA performed?: No, exam not consistent with LVO, confirmed on MRA Premorbid modified rankin scale:      2 - Slight disability. Able to look after own affairs without assistance, but unable to carry out all previous activities.  ROS: All other review of systems was negative except as noted in the HPI.   Past Medical History:  Diagnosis Date   Acquired dilation of ascending aorta and aortic root ([place])    9mm by echo 01/2021   Acute on chronic diastolic CHF (congestive heart failure) ([place]) 01/13/2018   Acute respiratory failure with hypoxia ([place]) 01/13/2018   Adenomatous colon polyp 2007   Anasarca 01/31/2017   Anemia    Anxiety    Aortic stenosis    moderate AS by echo 01/2021   Arthritis    Asthma    Bilateral lower extremity edema 01/31/2017   Bilateral lower leg cellulitis    BPH without  obstruction/lower urinary tract symptoms 02/22/2017   Cellulitis 11/07/2017   Cellulitis of lower extremity    Chest pain    Chronic cough    Chronic diastolic CHF (congestive heart failure) ([facility])    Chronic venous stasis 03/07/2019   COPD (chronic obstructive pulmonary disease) ([facility])    Coronary artery calcification seen on CAT scan    Depression    Diabetic neuropathy ([place]) 09/11/2019   Hematuria 02/22/2017   History of anemia 12/26/2018   History of cardiac  cath    a.  01/2018 with angiographically minimal coronary disease with  up to 40% prox LAD and 20% mid LCx and OM2 with normal LVF and normal LVEDP.[name]   History of colon polyps 08/24/2018   Hypertension    Leukocytosis 08/18/2018   Morbid obesity ([place])    New onset type 2 diabetes mellitus ([place]) 11/08/2017   OSA (obstructive sleep apnea)    Pain due to onychomycosis of toenails of both feet 09/11/2019   Peripheral neuropathy 02/22/2017   Physical deconditioning 02/22/2017   Primary osteoarthritis, left shoulder 03/05/2017   PTSD (post-traumatic stress disorder)    Pure hypercholesterolemia    QT prolongation 03/07/2019   Right foot pain 01/13/2018   Seizures ([place])    Shortness of breath    Sinus tachycardia 03/07/2019   Sleep apnea    CPAP   Type 2 diabetes mellitus with vascular disease ([place]) 09/11/2019   Past Surgical History:  Procedure Laterality Date   ENDOVENOUS ABLATION SAPHENOUS VEIN W/ LASER Right 08/20/2020   endovenous laser ablation right greater saphenous vein by Gae Gallop MD    JOINT REPLACEMENT     left knee replacement x 2   KNEE ARTHROSCOPY Bilateral    LEFT HEART CATH AND CORONARY ANGIOGRAPHY N/A 01/17/2018   Procedure: LEFT HEART CATH AND CORONARY ANGIOGRAPHY;  Surgeon: Leonie Man, MD;  Location: [facility];  Service: Cardiovascular;  Laterality: N/A;   UMBILICAL HERNIA REPAIR     No current facility-administered medications for this encounter.  Current Outpatient Medications:    Accu-Chek FastClix Lancets MISC, 1 each by Other route in the morning and at bedtime., Disp: , Rfl:    acetaminophen (TYLENOL) 325 MG tablet, Take 2 tablets (650 mg total) by mouth every 4 (four) hours as needed for mild pain (or temp > 37.5 C (99.5 F))., Disp: , Rfl:    albuterol (VENTOLIN HFA) 108 (90 Base) MCG/ACT inhaler, Inhale 2 puffs into the lungs every 6 (six) hours as needed for wheezing or shortness of breath., Disp: , Rfl:    Alcohol Swabs (ALCOHOL PREP)  70 % PADS, 1 each by Other route in the morning, at noon, and at bedtime., Disp: , Rfl:    allopurinol (ZYLOPRIM) 100 MG tablet, Take 100 mg by mouth 2 (two) times daily., Disp: , Rfl:    apixaban (ELIQUIS) 5 MG TABS tablet, Take 1 tablet (5 mg total) by mouth 2 (two) times daily., Disp: 180 tablet, Rfl: 2   ARIPiprazole (ABILIFY) 5 MG tablet, TAKE ONE TABLET BY MOUTH EVERYDAY AT BEDTIME, Disp: 30 tablet, Rfl: 1   atorvastatin (LIPITOR) 40 MG tablet, TAKE ONE TABLET BY MOUTH DAILY AT 6PM, Disp: 90 tablet, Rfl: 2   B-D ULTRAFINE III SHORT PEN 31G X 8 MM MISC, 1 each by Other route See admin instructions. Use to inject insulin subcutaneously at bedtime as directed., Disp: , Rfl:    blood glucose meter kit and supplies, Dispense based on patient and insurance  preference. Use up to four times daily as directed. (FOR ICD-10 E10.9, E11.9)., Disp: 1 each, Rfl: 0   busPIRone (BUSPAR) 5 MG tablet, TAKE ONE TABLET BY MOUTH THREE TIMES DAILY, Disp: 270 tablet, Rfl: 1   cetirizine (ZYRTEC) 10 MG tablet, Take 10 mg by mouth at bedtime. , Disp: , Rfl:    dapagliflozin propanediol (FARXIGA) 10 MG TABS tablet, Take 1 tablet (10 mg total) by mouth daily before breakfast., Disp: 90 tablet, Rfl: 3   diltiazem (CARDIZEM CD) 180 MG 24 hr capsule, Take 1 capsule (180 mg total) by mouth daily., Disp: 30 capsule, Rfl: 2   Dulaglutide (TRULICITY) 4.5 ZO/1.0RU SOPN, Inject 4.5 mg as directed once a week., Disp: 4.5 mL, Rfl: 3   ferrous sulfate 324 (65 Fe) MG TBEC, Take 324 mg by mouth daily with breakfast. , Disp: , Rfl:    fluticasone (FLONASE) 50 MCG/ACT nasal spray, Place 2 sprays into both nostrils daily. , Disp: , Rfl:    insulin glargine (LANTUS SOLOSTAR) 100 UNIT/ML Solostar Pen, Inject 5 Units into the skin every morning., Disp: 30 mL, Rfl: 3   isosorbide mononitrate (IMDUR) 30 MG 24 hr tablet, Take 1 tablet (30 mg total) by mouth daily., Disp: 90 tablet, Rfl: 2   metFORMIN (GLUCOPHAGE-XR) 500 MG 24 hr tablet, Take  1,000 mg by mouth 2 (two) times daily., Disp: , Rfl:    metoprolol succinate (TOPROL-XL) 50 MG 24 hr tablet, Take 1 tablet (50 mg total) by mouth daily. Take with or immediately following a meal., Disp: 90 tablet, Rfl: 0   omeprazole (PRILOSEC) 20 MG capsule, Take 20 mg by mouth every morning., Disp: , Rfl:    oxyCODONE (OXY IR/ROXICODONE) 5 MG immediate release tablet, Take 5 mg by mouth every 8 (eight) hours as needed for severe pain., Disp: , Rfl:    potassium chloride SA (KLOR-CON) 20 MEQ tablet, Take 2 tablets (40 mEq total) by mouth daily., Disp: 180 tablet, Rfl: 2   prazosin (MINIPRESS) 2 MG capsule, Take 2 capsules (4 mg total) by mouth at bedtime., Disp: 180 capsule, Rfl: 2   pregabalin (LYRICA) 100 MG capsule, Take 100 mg by mouth in the morning, at noon, and at bedtime. , Disp: , Rfl:    pregabalin (LYRICA) 50 MG capsule, Take 1 capsule (50 mg total) by mouth 3 (three) times daily., Disp: 90 capsule, Rfl: 2   repaglinide (PRANDIN) 2 MG tablet, Take 1 tablet (2 mg total) by mouth 2 (two) times daily before a meal., Disp: 180 tablet, Rfl: 3   silver sulfADIAZINE (SILVADENE) 1 % cream, Apply 1 application topically See admin instructions. Apply as directed once a day as directed to bilateral legs, Disp: , Rfl:    spironolactone (ALDACTONE) 25 MG tablet, Take 1 tablet (25 mg total) by mouth daily., Disp: 90 tablet, Rfl: 2   temazepam (RESTORIL) 15 MG capsule, TAKE ONE CAPSULE BY MOUTH EVERYDAY AT BEDTIME, Disp: 30 capsule, Rfl: 1   torsemide (DEMADEX) 20 MG tablet, Take 2 tablets (40 mg total) in the morning and 1 tablet (20 mg total) in the evening., Disp: 270 tablet, Rfl: 2   traZODone (DESYREL) 100 MG tablet, Take 2 tablets (200 mg total) by mouth at bedtime., Disp: 180 tablet, Rfl: 3   venlafaxine XR (EFFEXOR-XR) 75 MG 24 hr capsule, Take 3 capsules (225 mg total) by mouth daily with breakfast., Disp: 270 capsule, Rfl: 3   Vitamin D, Ergocalciferol, (DRISDOL) 1.25 MG (50000 UNIT) CAPS  capsule, Take 50,000  Units by mouth every 7 (seven) days. Mondays, Disp: , Rfl:    Family History  Problem Relation Age of Onset   CAD Maternal Grandfather    Diabetes Other    Diabetes Mellitus II Neg Hx    Colon cancer Neg Hx    Esophageal cancer Neg Hx    Inflammatory bowel disease Neg Hx    Liver disease Neg Hx    Pancreatic cancer Neg Hx    Rectal cancer Neg Hx    Stomach cancer Neg Hx     Social History:  reports that he quit smoking about 5 years ago. His smoking use included cigarettes. He has never used smokeless tobacco. He reports that he does not currently use alcohol. He reports that he does not use drugs.  Exam: Current vital signs: BP 126/67   Pulse 90   Temp (P) 98.3 F (36.8 C)   Resp 18   SpO2 95%  Vital signs in last 24 hours: Temp:  [98.3 F (36.8 C)-99.4 F (37.4 C)] (P) 98.3 F (36.8 C) (09/19 2359) Pulse Rate:  [88-90] 90 (09/20 0001) Resp:  [18-20] 18 (09/20 0001) BP: (121-126)/(60-67) 126/67 (09/20 0001) SpO2:  [95 %-97 %] 95 % (09/20 0001)   Physical Exam  Constitutional: Appears generally stable, obese Psych: Affect appropriate to situation, calm and cooperative Eyes: No scleral injection HENT: No oropharyngeal obstruction.  MSK: no joint deformities.  Left knee healed scar Cardiovascular: Normal rate and regular rhythm.  Respiratory: Baseline shortness of breath GI: Soft.  No distension. There is no tenderness.  Skin: Warm dry and intact visible skin  Neuro: Mental Status: Patient is awake, alert, oriented to person, place, month, year, and situation Patient is able to give a clear and coherent history. No signs of aphasia or neglect Cranial Nerves: II: Visual Fields are full. Pupils are equal, round, and reactive to light.   III,IV, VI: EOMI without ptosis or diploplia.  V: Facial sensation is reduced on the left side VII: Facial movement is notable for mild left facial droop which was resolving on repeat examination.  VIII:  hearing is intact to voice X: Uvula elevates symmetrically XI: Shoulder shrug is symmetric. XII: tongue is midline without atrophy or fasciculations.  Motor: Tone is normal. Bulk is normal.  He is 4/5 in the left upper extremity and 2/5 in the left lower extremity Sensory: Sensation is reduced to light touch in the left arm and leg without extinction to double simultaneous stimuli Deep Tendon Reflexes: 2+ and slightly diminished in the left brachioradialis compared to the right, absent patellar's bilaterally Cerebellar: FNF and HKS are intact on the right, weakness limited on the left  NIHSS total 6 Score breakdown: One-point for left facial droop, one-point for left arm drift, 2 points for left leg weakness, one-point for sensation change on the left, one-point for mild dysarthria which was resolving    I have reviewed labs in epic and the results pertinent to this consultation are: New leukocytosis to 13.3, creatinine 0.97, GFR greater than 60  Hemoglobin A1c 04/08/2021 8%  I have reviewed the images obtained:  Head CT personally reviewed, no acute intracranial process  MRI brain personally reviewed, no acute intracranial abnormality, final radiology read pending MRA head personally reviewed, no LVO, final radiology read pending  MRI C-spine 02/09/2021 personally reviewed:  Motion degraded examination, as described and limiting evaluation.   Cervical spondylosis, as outlined. No more than mild spinal canal stenosis. Multilevel neural foraminal narrowing, as detailed and  greatest on the left at C2-C3 (severe), bilaterally at C3-C4 (severe), on the right at C4-C5 (moderate/severe), bilaterally at C5-C6 (severe) and bilaterally at C6-C7 (severe). Additional sites of mild and moderate foraminal stenosis, as above.   Mild degenerative endplate edema at Q5-Z5, C6-C7 and C7-T1.   Mild cervical dextrocurvature.   Straightening of the expected cervical lordosis.   Trace grade 1  spondylolisthesis at C3-C4, C4-C5 and C5-C6.  Impression: Recurrent left sided weakness in the setting of a headache. Prior extensive neurological workup. With a 3rd routine EEG, sensitivity for excluding epilepsy rises to close to 95% though notably the patient is still on medications that would act as antiseizure medications (pregabalin and temazepam).  Additionally discussed with patient and wife that he is on multiple sedating medications which may be contributing to his general sleepiness and may be leading to decompensation of his known cervical spine neuroforaminal stenoses for example  Recommendations: - Emergent MRI brain and MRA to rule out acute intracranial process given his risk factors obtained on my recommendations as detailed above - Repeat routine EEG - UA/UDS for evidence of infection  - PT/OT reevaluation if patient remains weaker than baseline in the morning - Outpatient EMG/NCS as previously recommended  - Neurology will follow-up routine EEG but otherwise be available on an as-needed basis going forward  Lesleigh Noe MD-PhD [facility] [phone] Available 7 PM to 7 AM, outside of these hours please call Neurologist on call as listed on Amion.   Total critical care time: 45 minutes   Critical care time was exclusive of separately billable procedures and treating other patients.   Critical care was necessary to treat or prevent imminent or life-threatening deterioration, patient was being evaluated for potential stroke with potential need for thrombolytic therapy or thrombectomy   Critical care was time spent personally by me on the following activities: development of treatment plan with patient and/or surrogate as well as nursing, discussions with consultants/primary team, evaluation of patient's response to treatment, examination of patient, obtaining history from patient or surrogate, ordering and performing treatments and interventions, ordering and  review of laboratory studies, ordering and review of radiographic studies, and re-evaluation of patient's condition as needed, as documented above.

## 2021-04-27 NOTE — ED Notes (Signed)
Pt to CT scanner

## 2021-04-27 NOTE — ED Provider Notes (Signed)
[facility] EMERGENCY DEPARTMENT Provider Note   CSN: [id number] Arrival date & time: 04/26/21  2051  An emergency department physician performed an initial assessment on this suspected stroke patient at [facility]. I evaluated patient in triage and activated as code stroke- taken to CT from triage.   History Chief Complaint  Patient presents with   Weakness    Viral Schramm is a 65 y.o. male with a hx of prior stroke, T2DM, CHF, anemia, COPD, obesity, hypercholesterolemia, and PAF on Eliquis who presents to the ED via EMS with complaints of slurred speech & L sided weakness since around 4-5PM. Hx primarily provided by patient's wife- relays completely normal @ 3PM, has had intermittent slurred speech, trouble getting his words out, & left sided weakness with periods of complete normalcy from 4-8PM then at 8PM sxs became constant. No alleviating/aggravating factors. Hx of similar when he had his stroke in June of this year, but has not had these problems again until today.  He also has had some right upper extremity twitching which they are concerned may be seizures- he has had these before.  Denies chest pain, visual disturbance, vomiting, syncope, or dizziness.  He also has had some dyspnea, no chest pain, per EMS hypoxic to 89%- applied supplemental O2 with improvement.    HPI     Past Medical History:  Diagnosis Date   Acquired dilation of ascending aorta and aortic root ([place])    5m by echo 01/2021   Acute on chronic diastolic CHF (congestive heart failure) ([facility] 01/13/2018   Acute respiratory failure with hypoxia ([facility] 01/13/2018   Adenomatous colon polyp 2007   Anasarca 01/31/2017   Anemia    Anxiety    Aortic stenosis    moderate AS by echo 01/2021   Arthritis    Asthma    Bilateral lower extremity edema 01/31/2017   Bilateral lower leg cellulitis    BPH without obstruction/lower urinary tract symptoms 02/22/2017   Cellulitis 11/07/2017   Cellulitis of lower  extremity    Chest pain    Chronic cough    Chronic diastolic CHF (congestive heart failure) (HCC)    Chronic venous stasis 03/07/2019   COPD (chronic obstructive pulmonary disease) (HCC)    Coronary artery calcification seen on CAT scan    Depression    Diabetic neuropathy ([facility] 09/11/2019   Hematuria 02/22/2017   History of anemia 12/26/2018   History of cardiac cath    a.  01/2018 with angiographically minimal coronary disease with  up to 40% prox LAD and 20% mid LCx and OM2 with normal LVF and normal LVEDP..[name]  History of colon polyps 08/24/2018   Hypertension    Leukocytosis 08/18/2018   Morbid obesity ([facility]    New onset type 2 diabetes mellitus ([facility] 11/08/2017   OSA (obstructive sleep apnea)    Pain due to onychomycosis of toenails of both feet 09/11/2019   Peripheral neuropathy 02/22/2017   Physical deconditioning 02/22/2017   Primary osteoarthritis, left shoulder 03/05/2017   PTSD (post-traumatic stress disorder)    Pure hypercholesterolemia    QT prolongation 03/07/2019   Right foot pain 01/13/2018   Seizures (HCC)    Shortness of breath    Sinus tachycardia 03/07/2019   Sleep apnea    CPAP   Type 2 diabetes mellitus with vascular disease ([facility] 09/11/2019    Patient Active Problem List   Diagnosis Date Noted   Left-sided weakness 02/09/2021   Atrial fibrillation with rapid ventricular response ([facility] [place]  02/08/2021   Acute left-sided weakness 01/25/2021   AF (paroxysmal atrial fibrillation) ([place]) 01/25/2021   COPD (chronic obstructive pulmonary disease) ([place]) 01/25/2021   Moderate aortic stenosis 01/21/2021   Acquired dilation of ascending aorta and aortic root ([place]) 01/21/2021   Coronary artery disease due to lipid rich plaque    SVT (supraventricular tachycardia) (HCC)    Partial seizure ([place])    Acute CHF (congestive heart failure) ([place]) 10/17/2020   Essential hypertension 10/17/2020   Hypokalemia 10/17/2020   Cellulitis of right leg 12/04/2019   Pain due to  onychomycosis of toenails of both feet 09/11/2019   Diabetic neuropathy ([place]) 09/11/2019   Type 2 diabetes mellitus with diabetic polyneuropathy, with long-term current use of insulin ([place]) 09/11/2019   Sinus tachycardia 03/07/2019   Chronic venous stasis 03/07/2019   History of anemia 12/26/2018   Anemia 08/24/2018   History of colon polyps 08/24/2018   Do not resuscitate status 08/24/2018   Chronic diastolic CHF (congestive heart failure) ([place]) 08/18/2018   Bilateral lower leg cellulitis    Coronary artery calcification seen on CAT scan    Pure hypercholesterolemia    Chest pain    Shortness of breath    Acute respiratory failure with hypoxia ([place]) 01/13/2018   Right foot pain 01/13/2018   Acute on chronic diastolic CHF (congestive heart failure) ([place]) 01/13/2018   Diabetes mellitus type 2 in obese ([place]) 01/13/2018   New onset type 2 diabetes mellitus ([place]) 11/08/2017   Cellulitis 11/07/2017   Primary osteoarthritis, left shoulder 03/05/2017   Essential hypertension, benign 02/22/2017   BPH without obstruction/lower urinary tract symptoms 02/22/2017   Hematuria 02/22/2017   Peripheral neuropathy 02/22/2017   Physical deconditioning 02/22/2017   Morbid obesity ([place])    PTSD (post-traumatic stress disorder)    Anasarca 01/31/2017   Bilateral lower extremity edema 01/31/2017   OSA (obstructive sleep apnea)     Past Surgical History:  Procedure Laterality Date   ENDOVENOUS ABLATION SAPHENOUS VEIN W/ LASER Right 08/20/2020   endovenous laser ablation right greater saphenous vein by Gae Gallop MD    JOINT REPLACEMENT     left knee replacement x 2   KNEE ARTHROSCOPY Bilateral    LEFT HEART CATH AND CORONARY ANGIOGRAPHY N/A 01/17/2018   Procedure: LEFT HEART CATH AND CORONARY ANGIOGRAPHY;  Surgeon: Leonie Man, MD;  Location: [facility];  Service: Cardiovascular;  Laterality: N/A;   UMBILICAL HERNIA REPAIR         Family History  Problem Relation Age of Onset    CAD Maternal Grandfather    Diabetes Other    Diabetes Mellitus II Neg Hx    Colon cancer Neg Hx    Esophageal cancer Neg Hx    Inflammatory bowel disease Neg Hx    Liver disease Neg Hx    Pancreatic cancer Neg Hx    Rectal cancer Neg Hx    Stomach cancer Neg Hx     Social History   Tobacco Use   Smoking status: Former    Types: Cigarettes    Quit date: 03/07/2016    Years since quitting: 5.1   Smokeless tobacco: Never  Vaping Use   Vaping Use: Never used  Substance Use Topics   Alcohol use: Not Currently    Comment: rare   Drug use: No    Home Medications Prior to Admission medications   Medication Sig Start Date End Date Taking? Authorizing Provider  Accu-Chek FastClix Lancets MISC 1 each by Other route  in the morning and at bedtime. 12/22/20   [provider]  acetaminophen (TYLENOL) 325 MG tablet Take 2 tablets (650 mg total) by mouth every 4 (four) hours as needed for mild pain (or temp > 37.5 C (99.5 F)). 01/26/21   Cherene Altes, MD  albuterol (VENTOLIN HFA) 108 (90 Base) MCG/ACT inhaler Inhale 2 puffs into the lungs every 6 (six) hours as needed for wheezing or shortness of breath. 03/10/20   [provider]  Alcohol Swabs (ALCOHOL PREP) 70 % PADS 1 each by Other route in the morning, at noon, and at bedtime. 12/22/20   [provider]  allopurinol (ZYLOPRIM) 100 MG tablet Take 100 mg by mouth 2 (two) times daily.    [provider]  apixaban (ELIQUIS) 5 MG TABS tablet Take 1 tablet (5 mg total) by mouth 2 (two) times daily. 01/07/21   Sueanne Margarita, MD  ARIPiprazole (ABILIFY) 5 MG tablet TAKE ONE TABLET BY MOUTH EVERYDAY AT BEDTIME 04/20/21   Mozingo, Berdie Ogren, NP  atorvastatin (LIPITOR) 40 MG tablet TAKE ONE TABLET BY MOUTH DAILY AT 6PM 01/07/21   Sueanne Margarita, MD  B-D ULTRAFINE III SHORT PEN 31G X 8 MM MISC 1 each by Other route See admin instructions. Use to inject insulin subcutaneously at bedtime as directed. 11/20/20    [provider]  blood glucose meter kit and supplies Dispense based on patient and insurance preference. Use up to four times daily as directed. (FOR ICD-10 E10.9, E11.9). 11/09/17   Eugenie Filler, MD  busPIRone (BUSPAR) 5 MG tablet TAKE ONE TABLET BY MOUTH THREE TIMES DAILY 04/20/21   Mozingo, Berdie Ogren, NP  cetirizine (ZYRTEC) 10 MG tablet Take 10 mg by mouth at bedtime.     [provider]  dapagliflozin propanediol (FARXIGA) 10 MG TABS tablet Take 1 tablet (10 mg total) by mouth daily before breakfast. 01/05/21   Renato Shin, MD  diltiazem (CARDIZEM CD) 180 MG 24 hr capsule Take 1 capsule (180 mg total) by mouth daily. 02/12/21 05/13/21  [place] Donnamarie Poag, DO  Dulaglutide (TRULICITY) 4.5 UX/3.2GM SOPN Inject 4.5 mg as directed once a week. 11/19/20   Renato Shin, MD  ferrous sulfate 324 (65 Fe) MG TBEC Take 324 mg by mouth daily with breakfast.     [provider]  fluticasone (FLONASE) 50 MCG/ACT nasal spray Place 2 sprays into both nostrils daily.     [provider]  insulin glargine (LANTUS SOLOSTAR) 100 UNIT/ML Solostar Pen Inject 5 Units into the skin every morning. 04/08/21   Renato Shin, MD  isosorbide mononitrate (IMDUR) 30 MG 24 hr tablet Take 1 tablet (30 mg total) by mouth daily. 01/07/21   Sueanne Margarita, MD  metFORMIN (GLUCOPHAGE-XR) 500 MG 24 hr tablet Take 1,000 mg by mouth 2 (two) times daily. 01/22/21   [provider]  metoprolol succinate (TOPROL-XL) 50 MG 24 hr tablet Take 1 tablet (50 mg total) by mouth daily. Take with or immediately following a meal. 02/12/21 05/13/21  [place] Donnamarie Poag, DO  omeprazole (PRILOSEC) 20 MG capsule Take 20 mg by mouth every morning. 05/29/20   [provider]  oxyCODONE (OXY IR/ROXICODONE) 5 MG immediate release tablet Take 5 mg by mouth every 8 (eight) hours as needed for severe pain. 09/17/20   [provider]  potassium chloride SA (KLOR-CON) 20 MEQ tablet Take 2 tablets (40 mEq  total) by mouth daily. 01/07/21   Sueanne Margarita, MD  prazosin (MINIPRESS)  2 MG capsule Take 2 capsules (4 mg total) by mouth at bedtime. 01/07/21   Sueanne Margarita, MD  pregabalin (LYRICA) 100 MG capsule Take 100 mg by mouth in the morning, at noon, and at bedtime.  07/14/19   [provider]  pregabalin (LYRICA) 50 MG capsule Take 1 capsule (50 mg total) by mouth 3 (three) times daily. 04/22/21   Alric Ran, MD  repaglinide (PRANDIN) 2 MG tablet Take 1 tablet (2 mg total) by mouth 2 (two) times daily before a meal. 04/20/21   Renato Shin, MD  silver sulfADIAZINE (SILVADENE) 1 % cream Apply 1 application topically See admin instructions. Apply as directed once a day as directed to bilateral legs    [provider]  spironolactone (ALDACTONE) 25 MG tablet Take 1 tablet (25 mg total) by mouth daily. 01/07/21   Sueanne Margarita, MD  temazepam (RESTORIL) 15 MG capsule TAKE ONE CAPSULE BY MOUTH EVERYDAY AT BEDTIME 04/20/21   Mozingo, Berdie Ogren, NP  torsemide (DEMADEX) 20 MG tablet Take 2 tablets (40 mg total) in the morning and 1 tablet (20 mg total) in the evening. 01/07/21   Sueanne Margarita, MD  traZODone (DESYREL) 100 MG tablet Take 2 tablets (200 mg total) by mouth at bedtime. 12/31/20   Mozingo, Berdie Ogren, NP  venlafaxine XR (EFFEXOR-XR) 75 MG 24 hr capsule Take 3 capsules (225 mg total) by mouth daily with breakfast. 12/10/20   Mozingo, Berdie Ogren, NP  Vitamin D, Ergocalciferol, (DRISDOL) 1.25 MG (50000 UNIT) CAPS capsule Take 50,000 Units by mouth every 7 (seven) days. Mondays    [provider]    Allergies    Vancomycin, Niacin, Niacin and related, Tubersol [tuberculin], and Doxycycline  Review of Systems   Review of Systems  Constitutional:  Negative for chills and fever.  Eyes:  Negative for visual disturbance.  Respiratory:  Positive for shortness of breath.   Cardiovascular:  Negative for chest pain.  Gastrointestinal:  Negative for abdominal pain,  nausea and vomiting.  Neurological:  Positive for seizures (? RUE twitching), speech difficulty and weakness. Negative for syncope.  All other systems reviewed and are negative.  Physical Exam Updated Vital Signs BP 126/67   Pulse 90   Temp (P) 98.3 F (36.8 C)   Resp 18   SpO2 95%   Physical Exam Vitals and nursing note reviewed.  Constitutional:      General: He is not in acute distress. HENT:     Head: Normocephalic and atraumatic.  Eyes:     Extraocular Movements: Extraocular movements intact.     Pupils: Pupils are equal, round, and reactive to light.  Cardiovascular:     Rate and Rhythm: Normal rate.  Pulmonary:     Effort: No respiratory distress.     Breath sounds: No stridor.     Comments: Course breath sounds. SpO2 >95% on 4L via [place].  Musculoskeletal:     Cervical back: Neck supple. No rigidity.     Right lower leg: Edema present.     Left lower leg: Edema present.  Skin:    General: Skin is warm and dry.  Neurological:     Mental Status: He is alert.     Comments: Alert. Slurred speech. Mild left sided UE/LE drift. Mildly decreased grip strength & plantar/dorsiflexion in the left compared to the right. No neglect.   Psychiatric:        Mood and Affect: Mood normal.        Behavior:  Behavior normal.    ED Results / Procedures / Treatments   Labs (all labs ordered are listed, but only abnormal results are displayed) Labs Reviewed  CBC - Abnormal; Notable for the following components:      Result Value   WBC 13.3 (*)    All other components within normal limits  DIFFERENTIAL - Abnormal; Notable for the following components:   Neutro Abs 11.1 (*)    Abs Immature Granulocytes 0.09 (*)    All other components within normal limits  COMPREHENSIVE METABOLIC PANEL - Abnormal; Notable for the following components:   Chloride 97 (*)    Glucose, Bld 163 (*)    All other components within normal limits  I-STAT CHEM 8, ED - Abnormal; Notable for the following  components:   BUN 25 (*)    Glucose, Bld 159 (*)    Calcium, Ion 1.08 (*)    All other components within normal limits  RESP PANEL BY RT-PCR (FLU A&B, COVID) ARPGX2  ETHANOL  PROTIME-INR  APTT  RAPID URINE DRUG SCREEN, HOSP PERFORMED  URINALYSIS, ROUTINE W REFLEX MICROSCOPIC    EKG None  Radiology DG Chest Portable 1 View  Result Date: 04/27/2021 CLINICAL DATA:  Dyspnea. EXAM: PORTABLE CHEST 1 VIEW COMPARISON:  Chest radiograph dated 02/08/2021 FINDINGS: Shallow inspiration with bibasilar atelectasis. There is cardiomegaly with vascular congestion. No focal consolidation, pleural effusion or pneumothorax. Acute osseous pathology. IMPRESSION: Cardiomegaly with vascular congestion. Electronically Signed   By: Anner Crete M.D.   On: 04/27/2021 02:06   CT HEAD CODE STROKE WO CONTRAST  Result Date: 04/27/2021 CLINICAL DATA:  Code stroke. EXAM: CT HEAD WITHOUT CONTRAST TECHNIQUE: Contiguous axial images were obtained from the base of the skull through the vertex without intravenous contrast. COMPARISON:  02/08/2021 FINDINGS: Brain: No evidence of acute infarction, hemorrhage, cerebral edema, mass, mass effect, or midline shift. Ventricles and sulci are normal for age. No extra-axial fluid collection. Vascular: No hyperdense vessel or unexpected calcification. Skull: Normal. Negative for fracture or focal lesion. Sinuses/Orbits: No acute finding. Other: The mastoid air cells are well aerated. ASPECTS [facility] Stroke Program Early CT Score) - Ganglionic level infarction (caudate, lentiform nuclei, internal capsule, insula, M1-M3 cortex): 7 - Supraganglionic infarction (M4-M6 cortex): 3 Total score (0-10 with 10 being normal): 10 IMPRESSION: 1. No acute intracranial process. 2. ASPECTS is 10 Code stroke imaging results were communicated on 04/27/2021 at 12:37 am to provider Dr. Curly Shores Via secure text paging. Electronically Signed   By: Merilyn Baba M.D.   On: 04/27/2021 00:41     Procedures .Critical Care Performed by: Amaryllis Dyke, PA-C Authorized by: Amaryllis Dyke, PA-C    CRITICAL CARE Performed by: Kennith Maes   Total critical care time: 30 minutes  Critical care time was exclusive of separately billable procedures and treating other patients.  Critical care was necessary to treat or prevent imminent or life-threatening deterioration.  Critical care was time spent personally by me on the following activities: development of treatment plan with patient and/or surrogate as well as nursing, discussions with consultants, evaluation of patient's response to treatment, examination of patient, obtaining history from patient or surrogate, ordering and performing treatments and interventions, ordering and review of laboratory studies, ordering and review of radiographic studies, pulse oximetry and re-evaluation of patient's condition.  Medications Ordered in ED Medications  furosemide (LASIX) injection 80 mg (has no administration in time range)    ED Course  I have reviewed the triage vital signs and the  nursing notes.  Pertinent labs & imaging results that were available during my care of the patient were reviewed by me and considered in my medical decision making (see chart for details).    MDM Rules/Calculators/A&P                           Patient presents to the ED with complaints of slurred speech and left-sided weakness as well as some question of right upper extremity twitching/seizure activity.  Patient is nontoxic, vitals without significant abnormality on supplemental oxygen which was applied by EMS.  On my exam patient does have slurred speech with some left-sided drift/weakness, symptoms developed around 4 PM, however they have been resolving and recurring, have been constant since 8 PM, will activate code stroke from triage.  Additional history obtained:  Additional history obtained from chart review & nursing note  review.  Echo 01/2021: EF 70-75%  Lab Tests:  I Ordered, reviewed, and interpreted labs, which included:  CBC, CMP, PT T/INR, APTT, ethanol: Fairly unremarkable, mild leukocytosis and hyperglycemia.  Imaging Studies ordered:  I ordered imaging studies which included CT head wo & CXR, I independently reviewed, formal radiology impression shows:  CT head: 1. No acute intracranial process. 2. ASPECTS is 10 Chest x-ray: Cardiomegaly with vascular congestion  ED Course:  Discussed with neurologist Dr. Curly Shores, no TPA/IR, CT head/ MRI without acute process- recommends admission to medicine for observation-plan for routine EEG. Additionally CXR with cardiomegaly & vascular congestion, course breath sounds, peripheral edema- clinical concern for acute CHF requiring supplemental O2, will diurese and monitor intake/output. BNP ordered.   02:47: CONSULT: Discussed with hospitalist Dr. Marlowe Sax- accepts admission.   Findings and plan of care discussed with supervising physician Dr. Betsey Holiday who is in agreement.   Portions of this note were generated with [employer]obby[employer]. Dictation errors may occur despite best attempts at proofreading.  Final Clinical Impression(s) / ED Diagnoses Final diagnoses:  Slurred speech  Acute congestive heart failure, unspecified heart failure type [facility])    Rx / DC Orders ED Discharge Orders     None        Amaryllis Dyke, PA-C 04/27/21 0259    Orpah Greek, MD 04/27/21 [phone]

## 2021-04-27 NOTE — ED Notes (Signed)
Breakfast tray ordered per diet

## 2021-04-27 NOTE — H&P (Signed)
History and Physical    Collier Bohnet IOE:[id number] DOB: [birth year] DOA: 04/26/2021  PCP: [name], Hershal Coria; [place] Consultants:  April Manson - neurology; Loanne Drilling - endocrinology; [facility] - podiatry; Manuella Ghazi - orthopedics/pain management; [facility] - cardiology Patient coming from:  Home - lives with significant other; NOK: Significant other, Lawrence [name], [phone]  Chief Complaint: weakness  HPI: Lawrence [name] is a 65 y.o. male with medical history significant of chronic diastolic CHF; moderate AS; COPD; HTN; morbid obesity; DM; OSA; HLD; seizures; and OSA on CPAP presenting with weakness/fatigue following COVID/flu shots.  Code stroke was called for slurred speech, dysarthria, L-sided weakness, sensory deficit.  He was seen by neurology and recommended to have emergent MRI/MRA as well as EEG and PT/OT evaluations.   He thinks he had a mini-stroke.  He was told he was slurring his words, was totally out of it, his left side was weak.  The symptoms were brief and started getting better right away.  His left leg is still too weak to lift.  He has had a seizure once earlier this year, and had a "small seizure" last night - his R arm was shaking and he couldn't control it.  That was after the other symptoms.  He does not usually have issues with his left leg, but can't currently lift it.  He does not think he was SOB.  He did get his COVID and flu shots yesterday.    ED Course: Carryover, per Dr. Marlowe Sax:  65 year old with history of A. fib, CHF, diabetes, seizures, COPD, hypertension presenting with strokelike symptoms including slurred speech, left-sided weakness which started at 4 PM today.  Code stroke activated at triage.  Head CT negative.  Patient also complained of right arm shaking at home.  Neurology recommended admission for brain MRI/MRA and EEG.  Patient complained of shortness of breath and was satting in the upper 80s on room air.  Chest x-ray showing pulmonary vascular  congestion.  He was given a dose of Lasix and currently on 3 L supplemental oxygen.  Review of Systems: As per HPI; otherwise review of systems reviewed and negative.   Ambulatory Status:  Ambulates with a walker and uses a scooter  COVID Vaccine Status:  Complete plus boosters  Past Medical History:  Diagnosis Date   Acquired dilation of ascending aorta and aortic root ([place])    46m by echo 01/2021   Adenomatous colon polyp 2007   Anemia    Anxiety    Aortic stenosis    moderate AS by echo 01/2021   Asthma    BPH without obstruction/lower urinary tract symptoms 02/22/2017   Chronic diastolic (congestive) heart failure (HCC)    Chronic venous stasis 03/07/2019   COPD (chronic obstructive pulmonary disease) (HCC)    Coronary artery calcification seen on CAT scan    Depression    Diabetic neuropathy ([place] 09/11/2019   History of colon polyps 08/24/2018   Hypertension    Morbid obesity (HCC)    OSA (obstructive sleep apnea)    Pain due to onychomycosis of toenails of both feet 09/11/2019   Peripheral neuropathy 02/22/2017   Primary osteoarthritis, left shoulder 03/05/2017   PTSD (post-traumatic stress disorder)    Pure hypercholesterolemia    QT prolongation 03/07/2019   Seizures (HCC)    Shortness of breath    Sinus tachycardia 03/07/2019   Sleep apnea    CPAP   Type 2 diabetes mellitus with vascular disease ([place] 09/11/2019    Past Surgical History:  Procedure  Laterality Date   ENDOVENOUS ABLATION SAPHENOUS VEIN W/ LASER Right 08/20/2020   endovenous laser ablation right greater saphenous vein by Gae Gallop MD    JOINT REPLACEMENT     left knee replacement x 2   KNEE ARTHROSCOPY Bilateral    LEFT HEART CATH AND CORONARY ANGIOGRAPHY N/A 01/17/2018   Procedure: LEFT HEART CATH AND CORONARY ANGIOGRAPHY;  Surgeon: Leonie Man, MD;  Location: [facility];  Service: Cardiovascular;  Laterality: N/A;   UMBILICAL HERNIA REPAIR      Social History    Socioeconomic History   Marital status: Significant Other    Spouse name: [name]   Number of children: 1   Years of education: Not on file   Highest education level: Not on file  Occupational History   Occupation: retired  Tobacco Use   Smoking status: Former    Types: Cigarettes    Quit date: 03/07/2016    Years since quitting: 5.1   Smokeless tobacco: Never  Vaping Use   Vaping Use: Never used  Substance and Sexual Activity   Alcohol use: Yes    Comment: rare   Drug use: No   Sexual activity: Not on file  Other Topics Concern   Not on file  Social History Narrative   Lives with wife [name]   Right Handed   Drinks 1-2 cups caffeine   Social Determinants of Health   Financial Resource Strain: Not on file  Food Insecurity: Not on file  Transportation Needs: Not on file  Physical Activity: Not on file  Stress: Not on file  Social Connections: Not on file  Intimate Partner Violence: Not on file    Allergies  Allergen Reactions   Vancomycin Other (See Comments)    "Red Man Syndrome" 02/02/17: possible cause for rash under both arms   Niacin Other (See Comments)   Niacin And Related Other (See Comments)    Red man syndrome   Tubersol [Tuberculin] Other (See Comments)    Reaction unknown   Doxycycline Rash and Other (See Comments)    Family History  Problem Relation Age of Onset   CAD Maternal Grandfather    Diabetes Other    Diabetes Mellitus II Neg Hx    Colon cancer Neg Hx    Esophageal cancer Neg Hx    Inflammatory bowel disease Neg Hx    Liver disease Neg Hx    Pancreatic cancer Neg Hx    Rectal cancer Neg Hx    Stomach cancer Neg Hx     Prior to Admission medications   Medication Sig Start Date End Date Taking? Authorizing Provider  Accu-Chek FastClix Lancets MISC 1 each by Other route in the morning and at bedtime. 12/22/20  Yes [provider]  acetaminophen (TYLENOL) 325 MG tablet Take 2 tablets (650 mg total) by mouth every 4 (four)  hours as needed for mild pain (or temp > 37.5 C (99.5 F)). 01/26/21  Yes Cherene Altes, MD  albuterol (VENTOLIN HFA) 108 (90 Base) MCG/ACT inhaler Inhale 2 puffs into the lungs every 6 (six) hours as needed for wheezing or shortness of breath. 03/10/20  Yes [provider]  Alcohol Swabs (ALCOHOL PREP) 70 % PADS 1 each by Other route in the morning, at noon, and at bedtime. 12/22/20  Yes [provider]  allopurinol (ZYLOPRIM) 100 MG tablet Take 100 mg by mouth 2 (two) times daily.   Yes [provider]  apixaban (ELIQUIS) 5 MG TABS tablet Take  1 tablet (5 mg total) by mouth 2 (two) times daily. 01/07/21  Yes Turner, Traci R, MD  ARIPiprazole (ABILIFY) 5 MG tablet TAKE ONE TABLET BY MOUTH EVERYDAY AT BEDTIME Patient taking differently: Take 5 mg by mouth at bedtime. 04/20/21  Yes Mozingo, Berdie Ogren, NP  atorvastatin (LIPITOR) 40 MG tablet TAKE ONE TABLET BY MOUTH DAILY AT 6PM Patient taking differently: Take 40 mg by mouth every evening. TAKE ONE TABLET BY MOUTH DAILY AT 6PM 01/07/21  Yes Turner, Eber Hong, MD  B-D ULTRAFINE III SHORT PEN 31G X 8 MM MISC 1 each by Other route See admin instructions. Use to inject insulin subcutaneously at bedtime as directed. 11/20/20  Yes [provider]  blood glucose meter kit and supplies Dispense based on patient and insurance preference. Use up to four times daily as directed. (FOR ICD-10 E10.9, E11.9). 11/09/17  Yes Eugenie Filler, MD  busPIRone (BUSPAR) 5 MG tablet TAKE ONE TABLET BY MOUTH THREE TIMES DAILY Patient taking differently: Take 5 mg by mouth 3 (three) times daily. 04/20/21  Yes Mozingo, Berdie Ogren, NP  cetirizine (ZYRTEC) 10 MG tablet Take 10 mg by mouth at bedtime.    Yes [provider]  Cholecalciferol (VITAMIN D3 PO) Take 1 capsule by mouth daily.   Yes [provider]  dapagliflozin propanediol (FARXIGA) 10 MG TABS tablet Take 1 tablet (10 mg total) by mouth daily before breakfast.  01/05/21  Yes Renato Shin, MD  diltiazem (CARDIZEM CD) 180 MG 24 hr capsule Take 1 capsule (180 mg total) by mouth daily. 02/12/21 05/13/21 Yes [place] Eric J, DO  Dulaglutide (TRULICITY) 4.5 PY/0.5RT SOPN Inject 4.5 mg as directed once a week. Patient taking differently: Inject 4.5 mg as directed every Monday. 11/19/20  Yes Renato Shin, MD  ferrous sulfate 324 (65 Fe) MG TBEC Take 324 mg by mouth daily with breakfast.    Yes [provider]  fluticasone (FLONASE) 50 MCG/ACT nasal spray Place 2 sprays into both nostrils daily.    Yes [provider]  insulin glargine (LANTUS SOLOSTAR) 100 UNIT/ML Solostar Pen Inject 5 Units into the skin every morning. 04/08/21  Yes Renato Shin, MD  isosorbide mononitrate (IMDUR) 30 MG 24 hr tablet Take 1 tablet (30 mg total) by mouth daily. 01/07/21  Yes Turner, Eber Hong, MD  metFORMIN (GLUCOPHAGE-XR) 500 MG 24 hr tablet Take 1,000 mg by mouth 2 (two) times daily. 01/22/21  Yes [provider]  metoprolol succinate (TOPROL-XL) 50 MG 24 hr tablet Take 1 tablet (50 mg total) by mouth daily. Take with or immediately following a meal. 02/12/21 05/13/21 Yes [place] Donnamarie Poag, DO  omeprazole (PRILOSEC) 20 MG capsule Take 20 mg by mouth every morning. 05/29/20  Yes [provider]  Oxycodone HCl 10 MG TABS Take 10 mg by mouth every 8 (eight) hours as needed (severe pain). 05/05/21 07/04/21 Yes [provider]  potassium chloride SA (KLOR-CON) 20 MEQ tablet Take 2 tablets (40 mEq total) by mouth daily. 01/07/21  Yes Turner, Eber Hong, MD  prazosin (MINIPRESS) 2 MG capsule Take 2 capsules (4 mg total) by mouth at bedtime. 01/07/21  Yes Turner, Eber Hong, MD  pregabalin (LYRICA) 100 MG capsule Take 100 mg by mouth in the morning, at noon, and at bedtime. Along with 50 mg capsules 07/14/19  Yes [provider]  pregabalin (LYRICA) 50 MG capsule Take 1 capsule (50 mg total) by mouth 3 (three) times daily. Patient taking differently: Take 50 mg  by  mouth 3 (three) times daily. Along with 100 mg capsules 04/22/21  Yes Camara, [name], MD  repaglinide (PRANDIN) 2 MG tablet Take 1 tablet (2 mg total) by mouth 2 (two) times daily before a meal. 04/20/21  Yes Renato Shin, MD  spironolactone (ALDACTONE) 25 MG tablet Take 1 tablet (25 mg total) by mouth daily. 01/07/21  Yes Turner, Eber Hong, MD  temazepam (RESTORIL) 15 MG capsule TAKE ONE CAPSULE BY MOUTH EVERYDAY AT BEDTIME Patient taking differently: Take 15 mg by mouth at bedtime. 04/20/21  Yes Mozingo, Berdie Ogren, NP  torsemide (DEMADEX) 20 MG tablet Take 2 tablets (40 mg total) in the morning and 1 tablet (20 mg total) in the evening. 01/07/21  Yes Turner, Eber Hong, MD  traZODone (DESYREL) 100 MG tablet Take 2 tablets (200 mg total) by mouth at bedtime. 12/31/20  Yes Mozingo, Berdie Ogren, NP  venlafaxine XR (EFFEXOR-XR) 75 MG 24 hr capsule Take 3 capsules (225 mg total) by mouth daily with breakfast. 12/10/20  Yes Mozingo, Berdie Ogren, NP    Physical Exam: Vitals:   04/27/21 1515 04/27/21 1545 04/27/21 1549 04/27/21 1630  BP: 122/73 106/80  119/70  Pulse: 87 88  85  Resp: _0 Temp:   98.1 F (36.7 C)   TempSrc:   Oral   SpO2: 95% 93%  93%     General:  Appears calm and comfortable and is in NAD Eyes:  PERRL, EOMI, normal lids, iris ENT:  grossly normal hearing, lips & tongue, mmm Neck:  no LAD, masses or thyromegaly Cardiovascular:  RRR, no m/r/g. 1-2+ LE edema.  Respiratory:   CTA bilaterally with no wheezes/rales/rhonchi.  Normal respiratory effort. Abdomen:  soft, NT, ND Skin:  no rash or induration seen on limited exam; stasis dermatitis, stable Musculoskeletal:  no bony abnormality; L > RLE weakness, appears to be more proximal Psychiatric:  grossly normal mood and affect, speech fluent and appropriate, AOx3 Neurologic:  CN 2-12 grossly intact    Radiological Exams on Admission: Independently reviewed - see discussion in A/P where applicable  MR ANGIO HEAD  WO CONTRAST  Result Date: 04/27/2021 CLINICAL DATA:  Initial evaluation for neuro deficit, stroke suspected. EXAM: MRI HEAD WITHOUT CONTRAST MRA HEAD WITHOUT CONTRAST TECHNIQUE: Multiplanar, multi-echo pulse sequences of the brain and surrounding structures were acquired without intravenous contrast. Angiographic images of the Circle of Willis were acquired using MRA technique without intravenous contrast. COMPARISON:  Prior CT from earlier the same day as well as recent MRI from 02/08/2021. FINDINGS: MRI HEAD FINDINGS Brain: Limited study with diffusion weighted sequences, FLAIR sequence, and SWI sequence only was performed. Mild age-related cerebral atrophy. No abnormal foci of restricted diffusion to suggest acute or subacute ischemia. Gray-white matter differentiation maintained. No encephalomalacia to suggest chronic cortical infarction. No foci of susceptibility artifact to suggest acute or chronic intracranial hemorrhage. No mass lesion, midline shift or mass effect. No hydrocephalus or extra-axial fluid collection. Vascular: Major intracranial vascular flow voids are grossly maintained on this limited exam. Skull and upper cervical spine: Unremarkable on this limited exam. Sinuses/Orbits: Globes and orbital soft tissues within normal limits. Paranasal sinuses are largely clear. No significant mastoid effusion. Other: None. MRA HEAD FINDINGS Anterior circulation: Examination degraded by motion artifact. Visualized distal cervical segments of the internal carotid arteries are patent with antegrade flow. Petrous, cavernous, and supraclinoid segments patent without hemodynamically significant stenosis or other abnormality. A1 segments widely patent. Normal anterior communicating artery complex. Anterior cerebral arteries patent to their  distal aspects without significant stenosis. No M1 stenosis or occlusion. Normal MCA bifurcations. Distal MCA branches well perfused and symmetric. Posterior circulation:  Visualized V4 segments patent without stenosis. Left vertebral artery dominant. Left PICA origin patent and normal. Right PICA origin not visualized. Basilar patent to its distal aspect without significant stenosis. Superior cerebellar arteries patent bilaterally. Both PCA supplied via the basilar as well as small bilateral posterior communicating arteries. PCAs patent to their distal aspects without appreciable stenosis. Anatomic variants: None significant.  No aneurysm. IMPRESSION: MRI HEAD IMPRESSION: Negative brain MRI for age. No acute intracranial infarct or other abnormality. MRA HEAD IMPRESSION: Negative intracranial MRA. No large vessel occlusion. No hemodynamically significant or correctable stenosis. Electronically Signed   By: Jeannine Boga M.D.   On: 04/27/2021 03:12   MR BRAIN WO CONTRAST  Result Date: 04/27/2021 CLINICAL DATA:  Initial evaluation for neuro deficit, stroke suspected. EXAM: MRI HEAD WITHOUT CONTRAST MRA HEAD WITHOUT CONTRAST TECHNIQUE: Multiplanar, multi-echo pulse sequences of the brain and surrounding structures were acquired without intravenous contrast. Angiographic images of the Circle of Willis were acquired using MRA technique without intravenous contrast. COMPARISON:  Prior CT from earlier the same day as well as recent MRI from 02/08/2021. FINDINGS: MRI HEAD FINDINGS Brain: Limited study with diffusion weighted sequences, FLAIR sequence, and SWI sequence only was performed. Mild age-related cerebral atrophy. No abnormal foci of restricted diffusion to suggest acute or subacute ischemia. Gray-white matter differentiation maintained. No encephalomalacia to suggest chronic cortical infarction. No foci of susceptibility artifact to suggest acute or chronic intracranial hemorrhage. No mass lesion, midline shift or mass effect. No hydrocephalus or extra-axial fluid collection. Vascular: Major intracranial vascular flow voids are grossly maintained on this limited exam.  Skull and upper cervical spine: Unremarkable on this limited exam. Sinuses/Orbits: Globes and orbital soft tissues within normal limits. Paranasal sinuses are largely clear. No significant mastoid effusion. Other: None. MRA HEAD FINDINGS Anterior circulation: Examination degraded by motion artifact. Visualized distal cervical segments of the internal carotid arteries are patent with antegrade flow. Petrous, cavernous, and supraclinoid segments patent without hemodynamically significant stenosis or other abnormality. A1 segments widely patent. Normal anterior communicating artery complex. Anterior cerebral arteries patent to their distal aspects without significant stenosis. No M1 stenosis or occlusion. Normal MCA bifurcations. Distal MCA branches well perfused and symmetric. Posterior circulation: Visualized V4 segments patent without stenosis. Left vertebral artery dominant. Left PICA origin patent and normal. Right PICA origin not visualized. Basilar patent to its distal aspect without significant stenosis. Superior cerebellar arteries patent bilaterally. Both PCA supplied via the basilar as well as small bilateral posterior communicating arteries. PCAs patent to their distal aspects without appreciable stenosis. Anatomic variants: None significant.  No aneurysm. IMPRESSION: MRI HEAD IMPRESSION: Negative brain MRI for age. No acute intracranial infarct or other abnormality. MRA HEAD IMPRESSION: Negative intracranial MRA. No large vessel occlusion. No hemodynamically significant or correctable stenosis. Electronically Signed   By: Jeannine Boga M.D.   On: 04/27/2021 03:12   DG Chest Portable 1 View  Result Date: 04/27/2021 CLINICAL DATA:  Dyspnea. EXAM: PORTABLE CHEST 1 VIEW COMPARISON:  Chest radiograph dated 02/08/2021 FINDINGS: Shallow inspiration with bibasilar atelectasis. There is cardiomegaly with vascular congestion. No focal consolidation, pleural effusion or pneumothorax. Acute osseous  pathology. IMPRESSION: Cardiomegaly with vascular congestion. Electronically Signed   By: Anner Crete M.D.   On: 04/27/2021 02:06   EEG adult  Result Date: 04/27/2021 Lora Havens, MD     04/27/2021 12:30 PM Patient Name:  Lawrence [name] MRN: [id number] Epilepsy Attending: Lora Havens Referring Physician/Provider: Dr Lesleigh Noe Date: 04/27/2021 Duration: 22.49 mins Patient history: 65 yo M with recurrent left sided weakness in the setting of a headache. EEG to evaluate for seizure Level of alertness: Awake, asleep AEDs during EEG study: Temazepam, PGB Technical aspects: This EEG study was done with scalp electrodes positioned according to the 10-20 International system of electrode placement. Electrical activity was acquired at a sampling rate of _0  and reviewed with a high frequency filter of _1  and a low frequency filter of _2 . EEG data were recorded continuously and digitally stored. Description: The posterior dominant rhythm consists of 8 Hz activity of moderate voltage (25-35 uV) seen predominantly in posterior head regions, symmetric and reactive to eye opening and eye closing. Sleep was characterized by vertex waves, sleep spindles (12 to 14 Hz), maximal frontocentral region.  Physiologic photic driving was not seen during photic stimulation.  Hyperventilation was not performed.   IMPRESSION: This study is within normal limits. No seizures or epileptiform discharges were seen throughout the recording. If suspicion for ictal-interictal activity remains a concern, a prolonged study can  be considered. Lora Havens   CT HEAD CODE STROKE WO CONTRAST  Result Date: 04/27/2021 CLINICAL DATA:  Code stroke. EXAM: CT HEAD WITHOUT CONTRAST TECHNIQUE: Contiguous axial images were obtained from the base of the skull through the vertex without intravenous contrast. COMPARISON:  02/08/2021 FINDINGS: Brain: No evidence of acute infarction, hemorrhage, cerebral edema, mass, mass effect, or  midline shift. Ventricles and sulci are normal for age. No extra-axial fluid collection. Vascular: No hyperdense vessel or unexpected calcification. Skull: Normal. Negative for fracture or focal lesion. Sinuses/Orbits: No acute finding. Other: The mastoid air cells are well aerated. ASPECTS [facility] Stroke Program Early CT Score) - Ganglionic level infarction (caudate, lentiform nuclei, internal capsule, insula, M1-M3 cortex): 7 - Supraganglionic infarction (M4-M6 cortex): 3 Total score (0-10 with 10 being normal): 10 IMPRESSION: 1. No acute intracranial process. 2. ASPECTS is 10 Code stroke imaging results were communicated on 04/27/2021 at 12:37 am to provider Dr. Curly Shores Via secure text paging. Electronically Signed   By: Merilyn Baba M.D.   On: 04/27/2021 00:41    EKG: Independently reviewed.  NSR with rate 92; nonspecific ST changes with no evidence of acute ischemia   Labs on Admission: I have personally reviewed the available labs and imaging studies at the time of the admission.  Pertinent labs:   Glucose 163 WBC 13.3 INR 1.0 A1c 8.0 BNP 19.2 COVID/flu negative UA: >500 glucose UDS +opiates, BZD   Assessment/Plan Principal Problem:   Ambulatory dysfunction Active Problems:   OSA (obstructive sleep apnea)   Morbid obesity (HCC)   Essential hypertension, benign   Diabetes mellitus type 2 in obese (HCC)   Pure hypercholesterolemia   Chronic diastolic CHF (congestive heart failure) (HCC)   AF (paroxysmal atrial fibrillation) (HCC)   Stroke-like symptoms   Ambulatory dysfunction with strokelike symptoms -Concerning for TIA/CVA -MRI negative -h/o isolated seizure previously, EEG also negative -For now, will observe overnight -Continues to c/o LLE > RLE weakness, will need therapy evaluations -Telemetry monitoring -Neurology consult -PT/OT/ST/Nutrition Consults  HTN -Continue Cardizem, Toprol XL, prazosin   HLD -Continue Lipitor   DM -Check A1c -Hold home PO  medications (Farxiga, Trulicity, Glucophage, Prandin) -Continue Lantus -Will order moderate-scale SSI -DM coordinator consulted  Afib -Rate controlled with Cardizem, Toprol XL -Continue Eliquis  Chronic diastolic CHF -Appears to be compensated -Continue Imdur, torsemide, spironolactone  Gout -Continue Allopurinol  Mood d/o -Continue Abilify, Buspar, restoril, Trazodone, Effexor  OSA -Continue CPAP  Chronic pain -I have reviewed this patient in the [place] Controlled Substances Reporting System.  He is receiving medications from multiple providers but appears to be taking them as prescribed. -He is at particularly high risk of opioid misuse, diversion, or overdose.  -Continue oxycodone, Lyrica - no escalation of medications  Obesity -BMI 49.43  -Weight loss should be encouraged -Outpatient PCP/bariatric medicine/bariatric surgery f/u encouraged      Note: This patient has been tested and is negative for the novel coronavirus COVID-19. He has been fully vaccinated against COVID-19.    DVT prophylaxis:  Eliquis Code Status: DNR - confirmed with patient Family Communication: None present Disposition Plan:  The patient is from: home  Anticipated d/c is to: home, possibly with [facility] services   Anticipated d/c date will depend on clinical response to treatment, but possibly as early as tomorrow if he has excellent response to treatment  Patient is currently: acutely ill Consults called: Neurology; PT/OT/ST/Nutrition; [facility] team; DM coordinator Admission status: It is my clinical opinion that referral for OBSERVATION is reasonable and necessary in this patient based on the above information provided. The aforementioned taken together are felt to place the patient at high risk for further clinical deterioration. However it is anticipated that the patient may be medically stable for discharge from the hospital within 24 to 48 hours.      Karmen Bongo MD [facility]   How  to contact the [facility] Attending or Consulting provider [place] or covering provider during after hours [place], for this patient?  Check the care team in [facility] and look for a) attending/consulting [facility] provider listed and b) the [facility] team listed Log into [email or web address] and use [place]'s universal password to access. If you do not have the password, please contact the hospital operator. Locate the [facility] provider you are looking for under [facility] and page to a number that you can be directly reached. If you still have difficulty reaching the provider, please page the [facility] (Director on Call) for the Hospitalists listed on amion for assistance.   04/27/2021, 5:23 PM

## 2021-04-27 NOTE — ED Triage Notes (Signed)
Pt's family member states that around 1630-1700 he started having eye drooping, slurred speech and weakness.  Family states pt had a seizure before that was similar to this.  Pt c/o pain in left foot and right knee.

## 2021-04-27 NOTE — Procedures (Signed)
Patient Name: Lawrence [name]  MRN: [id number]  Epilepsy Attending: Lora Havens  Referring Physician/Provider: Dr Lesleigh Noe Date: 04/27/2021 Duration: 22.49 mins  Patient history: 65 yo M with recurrent left sided weakness in the setting of a headache. EEG to evaluate for seizure  Level of alertness: Awake, asleep  AEDs during EEG study: Temazepam, PGB  Technical aspects: This EEG study was done with scalp electrodes positioned according to the 10-20 International system of electrode placement. Electrical activity was acquired at a sampling rate of 500Hz  and reviewed with a high frequency filter of 70Hz  and a low frequency filter of 1Hz . EEG data were recorded continuously and digitally stored.   Description: The posterior dominant rhythm consists of 8 Hz activity of moderate voltage (25-35 uV) seen predominantly in posterior head regions, symmetric and reactive to eye opening and eye closing. Sleep was characterized by vertex waves, sleep spindles (12 to 14 Hz), maximal frontocentral region.  Physiologic photic driving was not seen during photic stimulation.  Hyperventilation was not performed.     IMPRESSION: This study is within normal limits. No seizures or epileptiform discharges were seen throughout the recording.  If suspicion for ictal-interictal activity remains a concern, a prolonged study can  be considered.   Lawrence [name] Lawrence [name]

## 2021-04-27 NOTE — ED Notes (Addendum)
Code stroke called by [place], [place] [address], slurred spech.

## 2021-04-27 NOTE — ED Notes (Signed)
EEG at bedside.

## 2021-04-28 DIAGNOSIS — R5381 Other malaise: Secondary | ICD-10-CM | POA: Diagnosis not present

## 2021-04-28 DIAGNOSIS — E7800 Pure hypercholesterolemia, unspecified: Secondary | ICD-10-CM

## 2021-04-28 DIAGNOSIS — I10 Essential (primary) hypertension: Secondary | ICD-10-CM

## 2021-04-28 DIAGNOSIS — I480 Paroxysmal atrial fibrillation: Secondary | ICD-10-CM

## 2021-04-28 DIAGNOSIS — E1169 Type 2 diabetes mellitus with other specified complication: Secondary | ICD-10-CM

## 2021-04-28 DIAGNOSIS — G4733 Obstructive sleep apnea (adult) (pediatric): Secondary | ICD-10-CM | POA: Diagnosis not present

## 2021-04-28 DIAGNOSIS — E669 Obesity, unspecified: Secondary | ICD-10-CM

## 2021-04-28 DIAGNOSIS — Z9189 Other specified personal risk factors, not elsewhere classified: Secondary | ICD-10-CM | POA: Diagnosis not present

## 2021-04-28 DIAGNOSIS — I5032 Chronic diastolic (congestive) heart failure: Secondary | ICD-10-CM

## 2021-04-28 DIAGNOSIS — R262 Difficulty in walking, not elsewhere classified: Secondary | ICD-10-CM | POA: Diagnosis not present

## 2021-04-28 DIAGNOSIS — Z743 Need for continuous supervision: Secondary | ICD-10-CM | POA: Diagnosis not present

## 2021-04-28 DIAGNOSIS — R569 Unspecified convulsions: Secondary | ICD-10-CM | POA: Diagnosis not present

## 2021-04-28 DIAGNOSIS — R531 Weakness: Secondary | ICD-10-CM | POA: Diagnosis not present

## 2021-04-28 LAB — LIPID PANEL
Cholesterol: 148 mg/dL (ref 0–200)
HDL: 30 mg/dL — ABNORMAL LOW (ref >40)
LDL Cholesterol: 69 mg/dL (ref 0–99)
Total CHOL/HDL Ratio: 4.9 RATIO
Triglycerides: 246 mg/dL — ABNORMAL HIGH (ref <150)
VLDL: 49 mg/dL — ABNORMAL HIGH (ref 0–40)

## 2021-04-28 LAB — HEMOGLOBIN A1C
Hgb A1c MFr Bld: 7.8 % — ABNORMAL HIGH (ref 4.8–5.6)
Mean Plasma Glucose: 177.16 mg/dL

## 2021-04-28 LAB — CBG MONITORING, ED
Glucose-Capillary: 175 mg/dL — ABNORMAL HIGH (ref 70–99)
Glucose-Capillary: 146 mg/dL — ABNORMAL HIGH (ref 70–99)
Glucose-Capillary: 190 mg/dL — ABNORMAL HIGH (ref 70–99)

## 2021-04-28 LAB — VITAMIN B12: Vitamin B-12: 203 pg/mL (ref 180–914)

## 2021-04-28 MED ORDER — VITAMIN B-12 1000 MCG PO TABS
1000.0000 ug | ORAL_TABLET | Freq: Every day | ORAL | Status: DC
  Administered 2021-04-28: 1000 ug via ORAL
  Filled 2021-04-28: qty 1

## 2021-04-28 MED ORDER — CYANOCOBALAMIN 1000 MCG/ML IJ SOLN
1000.0000 ug | Freq: Once | INTRAMUSCULAR | Status: DC
  Filled 2021-04-28: qty 1

## 2021-04-28 MED ORDER — CYANOCOBALAMIN 1000 MCG PO TABS: 1000.0000 ug | ORAL_TABLET | Freq: Every day | ORAL | Status: DC

## 2021-04-28 NOTE — ED Notes (Signed)
Placed Breakfast order 

## 2021-04-28 NOTE — Evaluation (Signed)
Occupational Therapy Evaluation Patient Details Name: Lawrence [name] MRN: [id number] DOB: [birth year] Today's Date: 04/28/2021   History of Present Illness 65 y.o. male with medical history significant of chronic diastolic CHF; moderate AS; COPD; HTN; morbid obesity; DM; OSA; HLD; seizures; and OSA on CPAP presenting with weakness/fatigue following COVID/flu shots.  Code stroke was called for slurred speech, dysarthria, L-sided weakness, sensory deficit.  MRI: No acute intracranial infarct or other  abnormality.   Clinical Impression   Patient admitted for the above diagnosis.  PTA he lives in a ground level apartment with his SO who can provide supportive assist, but not physical assist.   He has a HHA 4x/wk for ADL support, and was currently receiving [facility].  Patient is close to his baseline, and OT recommends home when medically cleared with prior level of support.       Recommendations for follow up therapy are one component of a multi-disciplinary discharge planning process, led by the attending physician.  Recommendations may be updated based on patient status, additional functional criteria and insurance authorization.   Follow Up Recommendations  No OT follow up    Equipment Recommendations  None recommended by OT    Recommendations for Other Services       Precautions / Restrictions Precautions Precautions: Fall Restrictions Weight Bearing Restrictions: No      Mobility Bed Mobility               General bed mobility comments: up in recliner    Transfers Overall transfer level: Needs assistance   Transfers: Sit to/from Stand;Stand Pivot Transfers Sit to Stand: Min assist Stand pivot transfers: Min guard            Balance Overall balance assessment: Needs assistance Sitting-balance support: Feet supported Sitting balance-Leahy Scale: Good     Standing balance support: Bilateral upper extremity supported Standing balance-Leahy Scale: Fair Standing  balance comment: min guard for mobility.                           ADL either performed or assessed with clinical judgement   ADL Overall ADL's : At baseline                                             Vision Patient Visual Report: No change from baseline       Perception     Praxis      Pertinent Vitals/Pain Pain Assessment: No/denies pain     Hand Dominance Right   Extremity/Trunk Assessment Upper Extremity Assessment Upper Extremity Assessment: Overall WFL for tasks assessed   Lower Extremity Assessment Lower Extremity Assessment: Defer to PT evaluation   Cervical / Trunk Assessment Cervical / Trunk Assessment: Other exceptions Cervical / Trunk Exceptions: body habitus   Communication Communication Communication: No difficulties   Cognition Arousal/Alertness: Awake/alert Behavior During Therapy: WFL for tasks assessed/performed Overall Cognitive Status: Within Functional Limits for tasks assessed                                     General Comments   VSS on RA    Exercises     Shoulder Instructions      Home Living Family/patient expects to be discharged to:: Private residence Living Arrangements: Spouse/significant other  Available Help at Discharge: Family;Available 24 hours/day Type of Home: Apartment Home Access: Level entry     Home Layout: One level     Bathroom Shower/Tub: Teacher, early years/pre: Handicapped height Bathroom Accessibility: No   Home Equipment: Environmental consultant - 4 wheels;Electric scooter;Hospital bed;Adaptive equipment;Bedside commode;Other (comment) (chair lift) Adaptive Equipment: [place]acher Additional Comments: has shower seat and bsc but they won't fit in bathroom      Prior Functioning/Environment Level of Independence: Needs assistance  Gait / Transfers Assistance Needed: walks 20' max distance with rollator, uses scooter outside, Uses public transportation ADL's /  Friendship Needed: HHaide MTW(3hrs) and F(4hrs); assist with LB bathing bed level, dressing, laundry and light home management            OT Problem List: Decreased strength;Decreased activity tolerance;Impaired balance (sitting and/or standing)      OT Treatment/Interventions:      OT Goals(Current goals can be found in the care plan section) Acute Rehab OT Goals Patient Stated Goal: Return home OT Goal Formulation: With patient Time For Goal Achievement: 04/28/21 Potential to Achieve Goals: Good  OT Frequency:     Barriers to D/C:  None noted          Co-evaluation PT/OT/SLP Co-Evaluation/Treatment: Yes Reason for Co-Treatment: Complexity of the patient's impairments (multi-system involvement);For patient/therapist safety;To address functional/ADL transfers   OT goals addressed during session: ADL's and self-care      AM-PAC OT "6 Clicks" Daily Activity     Outcome Measure Help from another person eating meals?: None Help from another person taking care of personal grooming?: None Help from another person toileting, which includes using toliet, bedpan, or urinal?: A Little Help from another person bathing (including washing, rinsing, drying)?: A Little Help from another person to put on and taking off regular upper body clothing?: A Little Help from another person to put on and taking off regular lower body clothing?: A Little 6 Click Score: 20   End of Session Equipment Utilized During Treatment: Rolling walker Nurse Communication: Mobility status  Activity Tolerance: Patient tolerated treatment well Patient left: in chair;with call bell/phone within reach  OT Visit Diagnosis: Unsteadiness on feet (R26.81);Muscle weakness (generalized) (M62.81)                Time: 7782-4235 OT Time Calculation (min): 17 min Charges:  OT General Charges $OT Visit: 1 Visit OT Evaluation $OT Eval Moderate Complexity: 1 Mod  04/28/2021  RP, OTR/L  Acute  Rehabilitation Services  Office:  [phone]   Metta Clines 04/28/2021, 12:17 PM

## 2021-04-28 NOTE — ED Notes (Signed)
Pt called out inquiring about AM medications. RN at bedside.

## 2021-04-28 NOTE — ED Notes (Signed)
[facility] called to transport patient  

## 2021-04-28 NOTE — Plan of Care (Signed)
EEG with no abnormalities. Plan as outlined by Dr. Curly [place].  -- Amie Portland, MD Neurologist [facility] Pager: [phone]

## 2021-04-28 NOTE — ED Notes (Signed)
Pt is sitting in recliner chair

## 2021-04-28 NOTE — Discharge Summary (Signed)
Physician Discharge Summary  Lawrence [name] JQB:[id number] DOB: April 21, 1956 DOA: 04/26/2021  PCP: [name], PA-C  Admit date: 04/26/2021 Discharge date: 04/28/2021 Admitted From: Home Disposition: Home Recommendations for Outpatient Follow-up:  Follow ups as below. Please obtain CBC/BMP/Mag at follow up Please follow up on the following pending results: Methylmalonic acid level Home Health: Resumed HH PT/OT/RN Equipment/Devices: Patient has appropriate DME's Discharge Condition: Stable CODE STATUS: DNR/DNI  Follow-up Information     [name], PA-C. Schedule an appointment as soon as possible for a visit in 1 week(s).   Specialty: Family Medicine Contact information: [place] [address] [phone]         Sueanne Margarita, MD. Schedule an appointment as soon as possible for a visit in 2 week(s).   Specialty: Cardiology Contact information: [address] [place] [address] [phone]                Hospital Course: 65 year old M with PMH of diastolic CHF, moderate AS, COPD, left hemiparesis, morbid obesity seizure disorder, DM-2, HTN, and OSA on CPAP brought to ED as code stroke due to slurred speech, dysarthria, left-sided weakness with sensory deficit reportedly after COVID-19 and influenza vaccines.  Code stroke activated in ED while in triage.  Evaluated by neurology who recommended emergent MRI/MRA, EEG, UA and UDS.  MRI/MRA, EEG and UA unrevealing.  UDS positive for opiates and benzo (has prescription).  Neurology recommended PT/OT evaluation, pursuing on outpatient EMG/NCS as previously recommended, and signed off.  The next day, patient's symptoms basically resolved except for septal left-sided facial droop and left hemiparesis.  Speech was clear.  Mental status was intact.  He was evaluated by therapy, who recommended resuming home health PT and OT.  We have also added home health RN in case he did not of  the service before.  Of note, patient is at high risk for polypharmacy.  He is on significant dose of opiate, Lyrica, Restoril and trazodone.  This might have contributed to some of his presentation.  I have discussed my concern and encouraged him to review his medication with his prescriber or PCP.  His vitamin B12 was 203.  He was started on p.o. vitamin B12.  He may benefit from vitamin B12 injection.  His methylmalonic acid level is pending.  See individual problem list below for more on hospital course.  Discharge Diagnoses:  Ambulatory dysfunction with strokelike symptoms-improved except for subtle left facial droop and slight left hemiparesis.  Patient feels he is back to baseline.  EEG negative for seizure.  UA negative.  UDS with benzos and opiate (has Rx for both).   Vitamin B12 203.  Methylmalonic acid pending.Patient is at risk for polypharmacy.  -Outpatient follow-up with neurology for previously planned EMG/NCS -Strongly recommend reviewing his medications -Discharged on p.o. vitamin B12.  May benefit from monthly vitamin B12 injection.  -Resumed home PT/OT.  Added home health RN.   Other chronic medical conditions including diastolic CHF, NIDDM-2, A. fib, mood disorder, OSA, chronic pain, morbid obesity and hyperlipidemia stable. -Resume home medications as below. -Continue Cardizem, Toprol XL, prazosin  At risk for polypharmacy -Recommend review of his medications    Morbid obesity obesity-BMI 49.43.         Discharge Exam: Vitals:   04/28/21 1000 04/28/21 1006 04/28/21 1100 04/28/21 1205  BP: 120/72 120/72 93/73 120/74  Pulse: 87 91 93 93  Temp:      Resp: 14  18 20  SpO2: 91%  93% 94%  TempSrc:         GENERAL: No apparent distress.  Nontoxic. HEENT: MMM.  Vision and hearing grossly intact.  NECK: Supple.  No apparent JVD.  RESP: 92% on RA.  No IWOB.  Fair aeration bilaterally. CVS:  RRR. Heart sounds normal.  ABD/GI/GU: Bowel sounds present. Soft. Non  tender.  MSK/EXT:  Moves extremities. No apparent deformity. No edema.  SKIN: no apparent skin lesion or wound NEURO: Awake and alert.  Oriented x4.  Speech clear.  Subtle left facial droop and left pronator drift.  Motor 5/5 in right, 4/5 in LUE and 3+/5 in LLE.  Slightly diminished light sensation in both feet PSYCH: Calm. Normal affect.   Discharge Instructions  Discharge Instructions     Call MD for:  difficulty breathing, headache or visual disturbances   Complete by: As directed    Call MD for:  extreme fatigue   Complete by: As directed    Call MD for:  persistant dizziness or light-headedness   Complete by: As directed    Call MD for:  severe uncontrolled pain   Complete by: As directed    Call MD for:  temperature >100.4   Complete by: As directed    Diet - low sodium heart healthy   Complete by: As directed    Discharge instructions   Complete by: As directed    It has been a pleasure taking care of you!  You were hospitalized with slurred speech, difficulty speaking, fatigue and left-sided weakness but your symptoms improved to the point we think it is safe to let you go home and follow-up with your doctors..  It is unclear what caused the symptoms.  Not sure if this is related to your COVID or flu vaccine.  You are also on medications that could increase your risk of confusion and fall.  We strongly recommend you go over your medications with your primary care doctor or prescribers. The test is we have done including CT of the head, chest x-ray, MRI of your brain and EEG did not show anything significant.  Urine did not show urinary tract infection.   Will recommend follow-up with your primary care doctor in 1 to 2 weeks.  We also recommend you follow-up with your neurologist for further evaluation of your recurrent left-sided weakness.   Your vitamin B12 level is slightly low.  We gave you an injection for a jumpstart.  You may need this injection every month that can be  done by your primary care doctor.  We also started you on oral vitamin B12.    Take care,   Increase activity slowly   Complete by: As directed       Allergies as of 04/28/2021       Reactions   Vancomycin Other (See Comments)   "Red Man Syndrome" 02/02/17: possible cause for rash under both arms   Niacin Other (See Comments)   Niacin And Related Other (See Comments)   Red man syndrome   Tubersol [tuberculin] Other (See Comments)   Reaction unknown   Doxycycline Rash, Other (See Comments)        Medication List     TAKE these medications    Accu-Chek FastClix Lancets Misc 1 each by Other route in the morning and at bedtime.   acetaminophen 325 MG tablet Commonly known as: TYLENOL Take 2 tablets (650 mg total) by mouth every 4 (four) hours as needed for mild pain (or temp >  37.5 C (99.5 F)).   albuterol 108 (90 Base) MCG/ACT inhaler Commonly known as: VENTOLIN HFA Inhale 2 puffs into the lungs every 6 (six) hours as needed for wheezing or shortness of breath.   Alcohol Prep 70 % Pads 1 each by Other route in the morning, at noon, and at bedtime.   allopurinol 100 MG tablet Commonly known as: ZYLOPRIM Take 100 mg by mouth 2 (two) times daily.   apixaban 5 MG Tabs tablet Commonly known as: ELIQUIS Take 1 tablet (5 mg total) by mouth 2 (two) times daily.   ARIPiprazole 5 MG tablet Commonly known as: ABILIFY TAKE ONE TABLET BY MOUTH EVERYDAY AT BEDTIME What changed: See the new instructions.   atorvastatin 40 MG tablet Commonly known as: LIPITOR TAKE ONE TABLET BY MOUTH DAILY AT 6PM What changed:  how much to take how to take this when to take this   B-D ULTRAFINE III SHORT PEN 31G X 8 MM Misc Generic drug: Insulin Pen Needle 1 each by Other route See admin instructions. Use to inject insulin subcutaneously at bedtime as directed.   blood glucose meter kit and supplies Dispense based on patient and insurance preference. Use up to four times daily as  directed. (FOR ICD-10 E10.9, E11.9).   busPIRone 5 MG tablet Commonly known as: BUSPAR TAKE ONE TABLET BY MOUTH THREE TIMES DAILY   cetirizine 10 MG tablet Commonly known as: ZYRTEC Take 10 mg by mouth at bedtime.   cyanocobalamin 1000 MCG tablet Take 1 tablet (1,000 mcg total) by mouth daily.   dapagliflozin propanediol 10 MG Tabs tablet Commonly known as: Farxiga Take 1 tablet (10 mg total) by mouth daily before breakfast.   diltiazem 180 MG 24 hr capsule Commonly known as: CARDIZEM CD Take 1 capsule (180 mg total) by mouth daily.   ferrous sulfate 324 (65 Fe) MG Tbec Take 324 mg by mouth daily with breakfast.   fluticasone 50 MCG/ACT nasal spray Commonly known as: FLONASE Place 2 sprays into both nostrils daily.   isosorbide mononitrate 30 MG 24 hr tablet Commonly known as: IMDUR Take 1 tablet (30 mg total) by mouth daily.   Lantus SoloStar 100 UNIT/ML Solostar Pen Generic drug: insulin glargine Inject 5 Units into the skin every morning.   metFORMIN 500 MG 24 hr tablet Commonly known as: GLUCOPHAGE-XR Take 1,000 mg by mouth 2 (two) times daily.   metoprolol succinate 50 MG 24 hr tablet Commonly known as: TOPROL-XL Take 1 tablet (50 mg total) by mouth daily. Take with or immediately following a meal.   omeprazole 20 MG capsule Commonly known as: PRILOSEC Take 20 mg by mouth every morning.   Oxycodone HCl 10 MG Tabs Take 10 mg by mouth every 8 (eight) hours as needed (severe pain). Start taking on: May 05, 2021   potassium chloride SA 20 MEQ tablet Commonly known as: KLOR-CON Take 2 tablets (40 mEq total) by mouth daily.   prazosin 2 MG capsule Commonly known as: MINIPRESS Take 2 capsules (4 mg total) by mouth at bedtime.   pregabalin 100 MG capsule Commonly known as: LYRICA Take 100 mg by mouth in the morning, at noon, and at bedtime. Along with 50 mg capsules What changed: Another medication with the same name was changed. Make sure you  understand how and when to take each.   pregabalin 50 MG capsule Commonly known as: LYRICA Take 1 capsule (50 mg total) by mouth 3 (three) times daily. What changed: additional instructions   repaglinide  2 MG tablet Commonly known as: PRANDIN Take 1 tablet (2 mg total) by mouth 2 (two) times daily before a meal.   spironolactone 25 MG tablet Commonly known as: ALDACTONE Take 1 tablet (25 mg total) by mouth daily.   temazepam 15 MG capsule Commonly known as: RESTORIL TAKE ONE CAPSULE BY MOUTH EVERYDAY AT BEDTIME What changed: See the new instructions.   torsemide 20 MG tablet Commonly known as: Demadex Take 2 tablets (40 mg total) in the morning and 1 tablet (20 mg total) in the evening.   traZODone 100 MG tablet Commonly known as: DESYREL Take 2 tablets (200 mg total) by mouth at bedtime.   Trulicity 4.5 TR/1.7BV Sopn Generic drug: Dulaglutide Inject 4.5 mg as directed once a week. What changed: when to take this   venlafaxine XR 75 MG 24 hr capsule Commonly known as: EFFEXOR-XR Take 3 capsules (225 mg total) by mouth daily with breakfast.   VITAMIN D3 PO Take 1 capsule by mouth daily.        Consultations: Neurology  Procedures/Studies:   MR ANGIO HEAD WO CONTRAST  Result Date: 04/27/2021 CLINICAL DATA:  Initial evaluation for neuro deficit, stroke suspected. EXAM: MRI HEAD WITHOUT CONTRAST MRA HEAD WITHOUT CONTRAST TECHNIQUE: Multiplanar, multi-echo pulse sequences of the brain and surrounding structures were acquired without intravenous contrast. Angiographic images of the Circle of Willis were acquired using MRA technique without intravenous contrast. COMPARISON:  Prior CT from earlier the same day as well as recent MRI from 02/08/2021. FINDINGS: MRI HEAD FINDINGS Brain: Limited study with diffusion weighted sequences, FLAIR sequence, and SWI sequence only was performed. Mild age-related cerebral atrophy. No abnormal foci of restricted diffusion to suggest  acute or subacute ischemia. Gray-white matter differentiation maintained. No encephalomalacia to suggest chronic cortical infarction. No foci of susceptibility artifact to suggest acute or chronic intracranial hemorrhage. No mass lesion, midline shift or mass effect. No hydrocephalus or extra-axial fluid collection. Vascular: Major intracranial vascular flow voids are grossly maintained on this limited exam. Skull and upper cervical spine: Unremarkable on this limited exam. Sinuses/Orbits: Globes and orbital soft tissues within normal limits. Paranasal sinuses are largely clear. No significant mastoid effusion. Other: None. MRA HEAD FINDINGS Anterior circulation: Examination degraded by motion artifact. Visualized distal cervical segments of the internal carotid arteries are patent with antegrade flow. Petrous, cavernous, and supraclinoid segments patent without hemodynamically significant stenosis or other abnormality. A1 segments widely patent. Normal anterior communicating artery complex. Anterior cerebral arteries patent to their distal aspects without significant stenosis. No M1 stenosis or occlusion. Normal MCA bifurcations. Distal MCA branches well perfused and symmetric. Posterior circulation: Visualized V4 segments patent without stenosis. Left vertebral artery dominant. Left PICA origin patent and normal. Right PICA origin not visualized. Basilar patent to its distal aspect without significant stenosis. Superior cerebellar arteries patent bilaterally. Both PCA supplied via the basilar as well as small bilateral posterior communicating arteries. PCAs patent to their distal aspects without appreciable stenosis. Anatomic variants: None significant.  No aneurysm. IMPRESSION: MRI HEAD IMPRESSION: Negative brain MRI for age. No acute intracranial infarct or other abnormality. MRA HEAD IMPRESSION: Negative intracranial MRA. No large vessel occlusion. No hemodynamically significant or correctable stenosis.  Electronically Signed   By: Jeannine Boga M.D.   On: 04/27/2021 03:12   MR BRAIN WO CONTRAST  Result Date: 04/27/2021 CLINICAL DATA:  Initial evaluation for neuro deficit, stroke suspected. EXAM: MRI HEAD WITHOUT CONTRAST MRA HEAD WITHOUT CONTRAST TECHNIQUE: Multiplanar, multi-echo pulse sequences of the brain and surrounding  structures were acquired without intravenous contrast. Angiographic images of the Circle of Willis were acquired using MRA technique without intravenous contrast. COMPARISON:  Prior CT from earlier the same day as well as recent MRI from 02/08/2021. FINDINGS: MRI HEAD FINDINGS Brain: Limited study with diffusion weighted sequences, FLAIR sequence, and SWI sequence only was performed. Mild age-related cerebral atrophy. No abnormal foci of restricted diffusion to suggest acute or subacute ischemia. Gray-white matter differentiation maintained. No encephalomalacia to suggest chronic cortical infarction. No foci of susceptibility artifact to suggest acute or chronic intracranial hemorrhage. No mass lesion, midline shift or mass effect. No hydrocephalus or extra-axial fluid collection. Vascular: Major intracranial vascular flow voids are grossly maintained on this limited exam. Skull and upper cervical spine: Unremarkable on this limited exam. Sinuses/Orbits: Globes and orbital soft tissues within normal limits. Paranasal sinuses are largely clear. No significant mastoid effusion. Other: None. MRA HEAD FINDINGS Anterior circulation: Examination degraded by motion artifact. Visualized distal cervical segments of the internal carotid arteries are patent with antegrade flow. Petrous, cavernous, and supraclinoid segments patent without hemodynamically significant stenosis or other abnormality. A1 segments widely patent. Normal anterior communicating artery complex. Anterior cerebral arteries patent to their distal aspects without significant stenosis. No M1 stenosis or occlusion. Normal MCA  bifurcations. Distal MCA branches well perfused and symmetric. Posterior circulation: Visualized V4 segments patent without stenosis. Left vertebral artery dominant. Left PICA origin patent and normal. Right PICA origin not visualized. Basilar patent to its distal aspect without significant stenosis. Superior cerebellar arteries patent bilaterally. Both PCA supplied via the basilar as well as small bilateral posterior communicating arteries. PCAs patent to their distal aspects without appreciable stenosis. Anatomic variants: None significant.  No aneurysm. IMPRESSION: MRI HEAD IMPRESSION: Negative brain MRI for age. No acute intracranial infarct or other abnormality. MRA HEAD IMPRESSION: Negative intracranial MRA. No large vessel occlusion. No hemodynamically significant or correctable stenosis. Electronically Signed   By: Jeannine Boga M.D.   On: 04/27/2021 03:12   DG Chest Portable 1 View  Result Date: 04/27/2021 CLINICAL DATA:  Dyspnea. EXAM: PORTABLE CHEST 1 VIEW COMPARISON:  Chest radiograph dated 02/08/2021 FINDINGS: Shallow inspiration with bibasilar atelectasis. There is cardiomegaly with vascular congestion. No focal consolidation, pleural effusion or pneumothorax. Acute osseous pathology. IMPRESSION: Cardiomegaly with vascular congestion. Electronically Signed   By: Anner Crete M.D.   On: 04/27/2021 02:06   EEG adult  Result Date: 04/27/2021 Lora Havens, MD     04/27/2021 12:30 PM Patient Name: Lelon Ikard MRN: [id number] Epilepsy Attending: Lora Havens Referring Physician/Provider: Dr Lesleigh Noe Date: 04/27/2021 Duration: 22.49 mins Patient history: 65 yo M with recurrent left sided weakness in the setting of a headache. EEG to evaluate for seizure Level of alertness: Awake, asleep AEDs during EEG study: Temazepam, PGB Technical aspects: This EEG study was done with scalp electrodes positioned according to the 10-20 International system of electrode placement. Electrical  activity was acquired at a sampling rate of $Remov'500Hz'LWMAEn$  and reviewed with a high frequency filter of $RemoveB'70Hz'onySlcUj$  and a low frequency filter of $RemoveB'1Hz'ebnnyQpL$ . EEG data were recorded continuously and digitally stored. Description: The posterior dominant rhythm consists of 8 Hz activity of moderate voltage (25-35 uV) seen predominantly in posterior head regions, symmetric and reactive to eye opening and eye closing. Sleep was characterized by vertex waves, sleep spindles (12 to 14 Hz), maximal frontocentral region.  Physiologic photic driving was not seen during photic stimulation.  Hyperventilation was not performed.   IMPRESSION: This study is within normal  limits. No seizures or epileptiform discharges were seen throughout the recording. If suspicion for ictal-interictal activity remains a concern, a prolonged study can  be considered. Lora Havens   CT HEAD CODE STROKE WO CONTRAST  Result Date: 04/27/2021 CLINICAL DATA:  Code stroke. EXAM: CT HEAD WITHOUT CONTRAST TECHNIQUE: Contiguous axial images were obtained from the base of the skull through the vertex without intravenous contrast. COMPARISON:  02/08/2021 FINDINGS: Brain: No evidence of acute infarction, hemorrhage, cerebral edema, mass, mass effect, or midline shift. Ventricles and sulci are normal for age. No extra-axial fluid collection. Vascular: No hyperdense vessel or unexpected calcification. Skull: Normal. Negative for fracture or focal lesion. Sinuses/Orbits: No acute finding. Other: The mastoid air cells are well aerated. ASPECTS [facility] Stroke Program Early CT Score) - Ganglionic level infarction (caudate, lentiform nuclei, internal capsule, insula, M1-M3 cortex): 7 - Supraganglionic infarction (M4-M6 cortex): 3 Total score (0-10 with 10 being normal): 10 IMPRESSION: 1. No acute intracranial process. 2. ASPECTS is 10 Code stroke imaging results were communicated on 04/27/2021 at 12:37 am to provider Dr. Curly Shores Via secure text paging. Electronically Signed   By:  Merilyn Baba M.D.   On: 04/27/2021 00:41       The results of significant diagnostics from this hospitalization (including imaging, microbiology, ancillary and laboratory) are listed below for reference.     Microbiology: Recent Results (from the past 240 hour(s))  Resp Panel by RT-PCR (Flu A&B, Covid) Nasopharyngeal Swab     Status: None   Collection Time: 04/27/21  3:08 AM   Specimen: Nasopharyngeal Swab; Nasopharyngeal(NP) swabs in vial transport medium  Result Value Ref Range Status   SARS Coronavirus 2 by RT PCR NEGATIVE NEGATIVE Final    Comment: (NOTE) SARS-CoV-2 target nucleic acids are NOT DETECTED.  The SARS-CoV-2 RNA is generally detectable in upper respiratory specimens during the acute phase of infection. The lowest concentration of SARS-CoV-2 viral copies this assay can detect is 138 copies/mL. A negative result does not preclude SARS-Cov-2 infection and should not be used as the sole basis for treatment or other patient management decisions. A negative result may occur with  improper specimen collection/handling, submission of specimen other than nasopharyngeal swab, presence of viral mutation(s) within the areas targeted by this assay, and inadequate number of viral copies(<138 copies/mL). A negative result must be combined with clinical observations, patient history, and epidemiological information. The expected result is Negative.  Fact Sheet for Patients:  EntrepreneurPulse.com.au  Fact Sheet for Healthcare Providers:  IncredibleEmployment.be  This test is no t yet approved or cleared by the [place] FDA and  has been authorized for detection and/or diagnosis of SARS-CoV-2 by FDA under an Emergency Use Authorization (EUA). This EUA will remain  in effect (meaning this test can be used) for the duration of the COVID-19 declaration under Section 564(b)(1) of the Act, 21 U.S.C.section 360bbb-3(b)(1), unless the  authorization is terminated  or revoked sooner.       Influenza A by PCR NEGATIVE NEGATIVE Final   Influenza B by PCR NEGATIVE NEGATIVE Final    Comment: (NOTE) The Xpert Xpress SARS-CoV-2/FLU/RSV plus assay is intended as an aid in the diagnosis of influenza from Nasopharyngeal swab specimens and should not be used as a sole basis for treatment. Nasal washings and aspirates are unacceptable for Xpert Xpress SARS-CoV-2/FLU/RSV testing.  Fact Sheet for Patients: EntrepreneurPulse.com.au  Fact Sheet for Healthcare Providers: IncredibleEmployment.be  This test is not yet approved or cleared by the [place] FDA and has  been authorized for detection and/or diagnosis of SARS-CoV-2 by FDA under an Emergency Use Authorization (EUA). This EUA will remain in effect (meaning this test can be used) for the duration of the COVID-19 declaration under Section 564(b)(1) of the Act, 21 U.S.C. section 360bbb-3(b)(1), unless the authorization is terminated or revoked.  Performed at [facility], [place] [address], [place], [place] [address]      Labs:  CBC: Recent Labs  Lab 04/27/21 0018 04/27/21 0026  WBC 13.3*  --   NEUTROABS 11.1*  --   HGB 14.8 15.3  HCT 44.7 45.0  MCV 91.2  --   PLT 272  --    BMP &GFR Recent Labs  Lab 04/27/21 0018 04/27/21 0026  NA 135 137  K 4.0 4.0  CL 97* 99  CO2 27  --   GLUCOSE 163* 159*  BUN 21 25*  CREATININE 0.97 0.90  CALCIUM 9.1  --    Estimated Creatinine Clearance: 139.8 mL/min (by C-G formula based on SCr of 0.9 mg/dL). Liver & Pancreas: Recent Labs  Lab 04/27/21 0018  AST 25  ALT 31  ALKPHOS 86  BILITOT 0.6  PROT 7.2  ALBUMIN 3.7   No results for input(s): LIPASE, AMYLASE in the last 168 hours. No results for input(s): AMMONIA in the last 168 hours. Diabetic: Recent Labs    04/27/21 1246 04/28/21 0218  HGBA1C 7.8* 7.8*   Recent Labs  Lab 04/27/21 1244 04/27/21 1609  04/28/21 0216 04/28/21 0757 04/28/21 1217  GLUCAP 175* 177* 175* 146* 190*   Cardiac Enzymes: No results for input(s): CKTOTAL, CKMB, CKMBINDEX, TROPONINI in the last 168 hours. No results for input(s): PROBNP in the last 8760 hours. Coagulation Profile: Recent Labs  Lab 04/27/21 0018  INR 1.0   Thyroid Function Tests: No results for input(s): TSH, T4TOTAL, FREET4, T3FREE, THYROIDAB in the last 72 hours. Lipid Profile: Recent Labs    04/28/21 0218  CHOL 148  HDL 30*  LDLCALC 69  TRIG 246*  CHOLHDL 4.9   Anemia Panel: Recent Labs    04/28/21 0900  VITAMINB12 203   Urine analysis:    Component Value Date/Time   COLORURINE YELLOW 04/27/2021 0304   APPEARANCEUR CLEAR 04/27/2021 0304   LABSPEC 1.010 04/27/2021 0304   PHURINE 6.0 04/27/2021 0304   GLUCOSEU >=500 (A) 04/27/2021 0304   HGBUR NEGATIVE 04/27/2021 0304   BILIRUBINUR NEGATIVE 04/27/2021 0304   KETONESUR NEGATIVE 04/27/2021 0304   PROTEINUR NEGATIVE 04/27/2021 0304   NITRITE NEGATIVE 04/27/2021 0304   LEUKOCYTESUR NEGATIVE 04/27/2021 0304   Sepsis Labs: Invalid input(s): PROCALCITONIN, LACTICIDVEN   Time coordinating discharge: 55 minutes  SIGNED:  Mercy Riding, MD  [facility] 04/28/2021, 6:17 PM

## 2021-04-28 NOTE — Evaluation (Signed)
Physical Therapy Evaluation Patient Details Name: Lawrence [name] MRN: [id number] DOB: Jan 04, 1956 Today's Date: 04/28/2021  History of Present Illness  65 y.o. male was brought to ED on 9/19 for change in mobility accompanied by weakness, slurred speech, L side weakness and sensory changes with dysarthria had called code stroke.  Pt was found to have no findings on MRI of brain, but had just had Covid and flu shots.  PMHx:  chronic diastolic CHF; moderate AS; COPD; HTN; morbid obesity; DM; OSA; HLD; seizures; and OSA on CPAP  Clinical Impression  Pt is assessed with PT and OT today given his new L side weakness and previous issues of R knee that has not received a total joint replacement yet.  Pt is motivated to move and get home, and is recommended to continue his PT inpt if stay is needed but otherwise to continue HHPT as previously ordered.  Focus on new L knee weakness, as well as strengthening R knee from OA, and work on standing/gait endurance to increase safety with greater independence at home with wife.        Recommendations for follow up therapy are one component of a multi-disciplinary discharge planning process, led by the attending physician.  Recommendations may be updated based on patient status, additional functional criteria and insurance authorization.  Follow Up Recommendations Home health PT;Supervision for mobility/OOB    Equipment Recommendations  None recommended by PT    Recommendations for Other Services       Precautions / Restrictions Precautions Precautions: Fall Precaution Comments: monitor O2 sats Restrictions Weight Bearing Restrictions: No      Mobility  Bed Mobility               General bed mobility comments: up in chair when PT arrives    Transfers Overall transfer level: Needs assistance   Transfers: Sit to/from Stand;Stand Pivot Transfers Sit to Stand: Min assist;Mod assist Stand pivot transfers: Min guard       General transfer  comment: pt is reliant on R knee more than L knee to stand  Ambulation/Gait Ambulation/Gait assistance: Min guard;+2 safety/equipment Gait Distance (Feet): 20 Feet Assistive device: Rolling walker (2 wheeled);1 person hand held assist;Pushed wheelchair Gait Pattern/deviations: Step-through pattern;Wide base of support;Trunk flexed;Shuffle;Decreased stride length Gait velocity: reduced Gait velocity interpretation: <1.31 ft/sec, indicative of household ambulator General Gait Details: pt is on RW with support of chair closely guarding to protect him from fall risk  Stairs Stairs:  (deferred)          Wheelchair Mobility    Modified Rankin (Stroke Patients Only)       Balance Overall balance assessment: Needs assistance Sitting-balance support: Feet supported Sitting balance-Leahy Scale: Good     Standing balance support: Bilateral upper extremity supported Standing balance-Leahy Scale: Fair Standing balance comment: guarding for safety given B knee weakness                             Pertinent Vitals/Pain Pain Assessment: Faces Faces Pain Scale: Hurts a little bit Pain Location: R knee Pain Descriptors / Indicators: Guarding    Home Living Family/patient expects to be discharged to:: Private residence Living Arrangements: Spouse/significant other Available Help at Discharge: Family;Available 24 hours/day Type of Home: Apartment Home Access: Level entry     Home Layout: One level Home Equipment: Walker - 4 wheels;Electric scooter;Hospital bed;Adaptive equipment;Bedside commode Additional Comments: pt used scooter to back in to enter bathroom prev and  then got off using counter support    Prior Function Level of Independence: Needs assistance   Gait / Transfers Assistance Needed: walks 20' max distance with rollator, uses scooter outside, Uses public transportation  ADL's / Homemaking Assistance Needed: home health aide for 13 hours a week for  bathing, dressing and housework        Hand Dominance   Dominant Hand: Right    Extremity/Trunk Assessment   Upper Extremity Assessment Upper Extremity Assessment: Defer to OT evaluation    Lower Extremity Assessment Lower Extremity Assessment: Generalized weakness    Cervical / Trunk Assessment Cervical / Trunk Assessment: Other exceptions Cervical / Trunk Exceptions: body habitus  Communication   Communication: No difficulties  Cognition Arousal/Alertness: Awake/alert Behavior During Therapy: WFL for tasks assessed/performed Overall Cognitive Status: Within Functional Limits for tasks assessed                                        General Comments General comments (skin integrity, edema, etc.): Pt is up to chair with O2 sat 91% on room air, but after walk was 95%    Exercises     Assessment/Plan    PT Assessment Patient needs continued PT services  PT Problem List Decreased strength;Decreased activity tolerance;Decreased balance;Decreased coordination;Cardiopulmonary status limiting activity       PT Treatment Interventions DME instruction;Gait training;Functional mobility training;Therapeutic activities;Therapeutic exercise;Balance training;Neuromuscular re-education;Patient/family education    PT Goals (Current goals can be found in the Care Plan section)  Acute Rehab PT Goals Patient Stated Goal: home with wife PT Goal Formulation: With patient Time For Goal Achievement: 05/01/21 Potential to Achieve Goals: Good    Frequency Min 3X/week   Barriers to discharge Decreased caregiver support home with wife or caregiver, one person at a time    Co-evaluation PT/OT/SLP Co-Evaluation/Treatment: Yes Reason for Co-Treatment: For patient/therapist safety;To address functional/ADL transfers PT goals addressed during session: Mobility/safety with mobility;Balance;Proper use of DME OT goals addressed during session: ADL's and self-care        AM-PAC PT "6 Clicks" Mobility  Outcome Measure Help needed turning from your back to your side while in a flat bed without using bedrails?: A Little Help needed moving from lying on your back to sitting on the side of a flat bed without using bedrails?: A Little Help needed moving to and from a bed to a chair (including a wheelchair)?: A Little Help needed standing up from a chair using your arms (e.g., wheelchair or bedside chair)?: A Lot Help needed to walk in hospital room?: A Little Help needed climbing 3-5 steps with a railing? : A Lot 6 Click Score: 16    End of Session   Activity Tolerance: Patient tolerated treatment well;Patient limited by fatigue;Other (comment) (BLE weakness) Patient left: in chair;with call bell/phone within reach Nurse Communication: Mobility status;Other (comment) (communication of dc planning with MD and nursing) PT Visit Diagnosis: Unsteadiness on feet (R26.81);Muscle weakness (generalized) (M62.81);Difficulty in walking, not elsewhere classified (R26.2)    Time: 2956-2130 PT Time Calculation (min) (ACUTE ONLY): 31 min   Charges:   PT Evaluation $PT Eval Moderate Complexity: 1 Mod         Ramond Dial 04/28/2021, 12:32 PM  Mee Hives, PT MS Acute Rehab Dept. Number: [place] [facility] [place]

## 2021-04-30 LAB — METHYLMALONIC ACID, SERUM: Methylmalonic Acid, Quantitative: 493 nmol/L — ABNORMAL HIGH (ref 0–378)

## 2021-05-04 ENCOUNTER — Ambulatory Visit (INDEPENDENT_AMBULATORY_CARE_PROVIDER_SITE_OTHER): Payer: Medicare Other | Admitting: Neurology

## 2021-05-04 ENCOUNTER — Encounter: Payer: Self-pay | Admitting: Neurology

## 2021-05-04 VITALS — BP 107/64 | HR 80 | Ht 74.0 in | Wt 385.0 lb

## 2021-05-04 DIAGNOSIS — Z6841 Body Mass Index (BMI) 40.0 and over, adult: Secondary | ICD-10-CM

## 2021-05-04 DIAGNOSIS — Z79899 Other long term (current) drug therapy: Secondary | ICD-10-CM

## 2021-05-04 DIAGNOSIS — G4719 Other hypersomnia: Secondary | ICD-10-CM

## 2021-05-04 DIAGNOSIS — G4733 Obstructive sleep apnea (adult) (pediatric): Secondary | ICD-10-CM

## 2021-05-04 NOTE — Patient Instructions (Addendum)
  Based on your symptoms and your exam I believe you are still at risk for obstructive sleep apnea and would benefit from reevaluation as it has been many years.  If your current machine is not more than 64 years old, you may not qualify for new machine.  I would like to review your current report from your machine, please bring by the machine with its power cord so we can download the data of the machine in the near future.  We will proceed with a sleep study.  You may qualify for a different type of machine and in that case you may qualify for new machine.  Otherwise, we will be able to make adjustments to your current machine potentially.  We will plan a follow-up after sleep testing.   Please remember, the risks and ramifications of moderate to severe obstructive sleep apnea or OSA are: Cardiovascular disease, including congestive heart failure, stroke, difficult to control hypertension, arrhythmias, and even type 2 diabetes has been linked to untreated OSA. Sleep apnea causes disruption of sleep and sleep deprivation in most cases, which, in turn, can cause recurrent headaches, problems with memory, mood, concentration, focus, and vigilance. Most people with untreated sleep apnea report excessive daytime sleepiness, which can affect their ability to drive. Please do not drive if you feel sleepy.   Due to mobility issues and transportation challenges, I will order a home sleep test for better accommodation.

## 2021-05-04 NOTE — Progress Notes (Signed)
Subjective:    Patient ID: Lawrence [name] is a 65 y.o. male.  HPI    Lawrence Age, Lawrence [name], [name] [address], [address]O. [place], [place] [address]  Dear Lawrence [name],   I saw your patient, Lawrence [name], upon your kind request in my sleep clinic today for initial consultation of his sleep disorder, in particular, evaluation of his prior diagnosis of obstructive sleep apnea.  The patient is unaccompanied today.  As you know, Lawrence [name] is a 65 year old right-handed gentleman with an underlying medical of aortic stenosis, COPD, depression, anxiety, anemia, diabetes, neuropathy, hypertension, PTSD, hyperlipidemia, history of seizure-like activity, sinus tachycardia, and morbid obesity with a BMI of over 40, who was previously diagnosed with obstructive sleep apnea and placed on BiPAP therapy.  Prior sleep study results are not available for my review today, study was done through the [place] about 10 years ago.  I reviewed your office note from 04/22/2021.  He reports nonrestorative sleep and daytime somnolence.  I was not able to review his BiPAP compliance data, as he did not bring the machine.  He reports that he got this machine about 4 years ago, also through the [place]. He reports significant daytime somnolence.  His Epworth sleepiness score is 21 out of 24.  Of note, he is on multiple potentially sedating medications including Temazepam, trazodone, Abilify, metoprolol, oxycodone, Lyrica.  He was recently hospitalized from 04/26/2021 through 04/28/2021.  He was admitted due to slurring of speech and left-sided weakness.  I reviewed the hospital records.  He was felt to be at risk for polypharmacy given his medication regimen and was encouraged to discuss his medications with his prescribers.  He was found to have a low vitamin B12 and was advised to start oral B12 supplementation and discuss B12 injection. He lives with his SO, Lawrence [name], they have no pets at the house.  He goes  to bed around 10 PM and it is not hard for him to fall asleep but he has difficulty maintaining sleep, he reports that he wakes up about 2 hours later and goes to the living room.  He tries to be consistent with his BiPAP but does not indicate that he takes his machine into the living room when he sleeps there.  He watches TV in the living room and then falls asleep without warning.  He drinks caffeine in the form of coffee, about 2 cups/day, rare alcohol.   He had a tonsillectomy at [name]. He was recently treated for cellulitis with antibiotics. His primary care PA prescribes his antidepressant medication, his pain medication, and his medications for sleep.  Of note, he takes 200 mg of trazodone at night as well as temazepam at night. He is on high-dose Effexor, 225 mg daily, Abilify 5 mg at bedtime, BuSpar 5 mg 3 times daily, oxycodone 10 mg up to 3 times a day as needed, Lyrica 150 mg 3 times a day.  His Past Medical History Is Significant For: Past Medical History:  Diagnosis Date   Acquired dilation of ascending aorta and aortic root ([place])    40mm by echo 01/2021   Adenomatous colon polyp 2007   Anemia    Anxiety    Aortic stenosis    moderate AS by echo 01/2021   Asthma    BPH without obstruction/lower urinary tract symptoms 02/22/2017   Chronic diastolic (congestive) heart failure (HCC)    Chronic venous stasis 03/07/2019   COPD (chronic obstructive pulmonary disease) (  [place])    Coronary artery calcification seen on CAT scan    Depression    Diabetic neuropathy ([place]) 09/11/2019   History of colon polyps 08/24/2018   Hypertension    Morbid obesity (HCC)    OSA (obstructive sleep apnea)    Pain due to onychomycosis of toenails of both feet 09/11/2019   Peripheral neuropathy 02/22/2017   Primary osteoarthritis, left shoulder 03/05/2017   PTSD (post-traumatic stress disorder)    Pure hypercholesterolemia    QT prolongation 03/07/2019   Seizures (HCC)    Shortness of breath    Sinus  tachycardia 03/07/2019   Sleep apnea    CPAP   Type 2 diabetes mellitus with vascular disease ([place]) 09/11/2019    His Past Surgical History Is Significant For: Past Surgical History:  Procedure Laterality Date   ENDOVENOUS ABLATION SAPHENOUS VEIN W/ LASER Right 08/20/2020   endovenous laser ablation right greater saphenous vein by Lawrence [name] Lawrence [name]    JOINT REPLACEMENT     left knee replacement x 2   KNEE ARTHROSCOPY Bilateral    LEFT HEART CATH AND CORONARY ANGIOGRAPHY N/A 01/17/2018   Procedure: LEFT HEART CATH AND CORONARY ANGIOGRAPHY;  Surgeon: Lawrence [name], Lawrence [name];  Location: [facility];  Service: Cardiovascular;  Laterality: N/A;   UMBILICAL HERNIA REPAIR      His Family History Is Significant For: Family History  Problem Relation [name] of Onset   CAD Maternal Grandfather    Diabetes Other    Diabetes Mellitus II Neg Hx    Colon cancer Neg Hx    Esophageal cancer Neg Hx    Inflammatory bowel disease Neg Hx    Liver disease Neg Hx    Pancreatic cancer Neg Hx    Rectal cancer Neg Hx    Stomach cancer Neg Hx    Sleep apnea Neg Hx     His Social History Is Significant For: Social History   Socioeconomic History   Marital status: Significant Other    Spouse name: Lawrence [name]   Number of children: 1   Years of education: Not on file   Highest education level: Not on file  Occupational History   Occupation: retired  Tobacco Use   Smoking status: Former    Types: Cigarettes    Quit date: 03/07/2016    Years since quitting: 5.1   Smokeless tobacco: Never  Vaping Use   Vaping Use: Never used  Substance and Sexual Activity   Alcohol use: Yes    Comment: rare   Drug use: No   Sexual activity: Not on file  Other Topics Concern   Not on file  Social History Narrative   Lives with wife Lawrence [name]   Right Handed   Drinks 1-2 cups caffeine   Social Determinants of Health   Financial Resource Strain: Not on file  Food Insecurity: Not on file  Transportation Needs:  Not on file  Physical Activity: Not on file  Stress: Not on file  Social Connections: Not on file    His Allergies Are:  Allergies  Allergen Reactions   Vancomycin Other (See Comments)    "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   acetaminophen (TYLENOL) 325 MG tablet Take 2 tablets (650 mg total) by mouth every 4 (four) hours as needed for mild pain (or temp > 37.5 C (99.5 F)).   albuterol (VENTOLIN HFA) 108 (90 Base) MCG/ACT inhaler Inhale 2 puffs into the lungs every 6 (six) hours as needed for wheezing or shortness of breath.   Alcohol Swabs (ALCOHOL PREP) 70 % PADS 1 each by Other route in the morning, at noon, and at bedtime.   allopurinol (ZYLOPRIM) 100 MG tablet Take 100 mg by mouth daily. Patient is taking 2 tablets daily   apixaban (ELIQUIS) 5 MG TABS tablet Take 1 tablet (5 mg total) by mouth 2 (two) times daily.   ARIPiprazole (ABILIFY) 5 MG tablet TAKE ONE TABLET BY MOUTH EVERYDAY AT BEDTIME (Patient taking differently: Take 5 mg by mouth at bedtime.)   atorvastatin (LIPITOR) 40 MG tablet TAKE ONE TABLET BY MOUTH DAILY AT 6PM (Patient taking differently: Take 40 mg by mouth every evening. TAKE ONE TABLET BY MOUTH DAILY AT 6PM)   B-D ULTRAFINE III SHORT PEN 31G X 8 MM MISC 1 each by Other route See admin instructions. Use to inject insulin subcutaneously at bedtime as directed.   blood glucose meter kit and supplies Dispense based on patient and insurance preference. Use up to four times daily as directed. (FOR ICD-10 E10.9, E11.9).   busPIRone (BUSPAR) 5 MG tablet  TAKE ONE TABLET BY MOUTH THREE TIMES DAILY (Patient taking differently: Take 5 mg by mouth 3 (three) times daily.)   cetirizine (ZYRTEC) 10 MG tablet Take 10 mg by mouth at bedtime.    Cholecalciferol (VITAMIN D3 PO) Take 1 capsule by mouth daily.   dapagliflozin propanediol (FARXIGA) 10 MG TABS tablet Take 1 tablet (10 mg total) by mouth daily before breakfast.   diltiazem (CARDIZEM CD) 180 MG 24 hr capsule Take 1 capsule (180 mg total) by mouth daily.   Dulaglutide (TRULICITY) 4.5 NG/2.9BM SOPN Inject 4.5 mg as directed once a week. (Patient taking differently: Inject 4.5 mg as directed every Monday.)   ferrous sulfate 324 (65 Fe) MG TBEC Take 324 mg by mouth daily with breakfast.    fluticasone (FLONASE) 50 MCG/ACT nasal spray Place 2 sprays into both nostrils daily.    insulin glargine (LANTUS SOLOSTAR) 100 UNIT/ML Solostar Pen Inject 5 Units into the skin every morning.   isosorbide mononitrate (IMDUR) 30 MG 24 hr tablet Take 1 tablet (30 mg total) by mouth daily.   metFORMIN (GLUCOPHAGE-XR) 500 MG 24 hr tablet Take 1,000 mg by mouth 2 (two) times daily.   metoprolol succinate (TOPROL-XL) 50 MG 24 hr tablet Take 1 tablet (50 mg total) by mouth daily. Take with or immediately following a meal.   omeprazole (PRILOSEC) 20 MG capsule Take 20 mg by mouth every morning.   [START ON 05/05/2021] Oxycodone HCl 10 MG TABS Take 10 mg by mouth every 8 (eight) hours as needed (severe pain).   potassium chloride SA (KLOR-CON) 20 MEQ tablet Take 2 tablets (40 mEq total) by mouth daily.   prazosin (MINIPRESS) 2 MG capsule Take 2 capsules (4 mg total) by mouth at bedtime.   pregabalin (LYRICA) 100 MG capsule Take 100 mg by mouth in the morning, at noon, and at bedtime. Along with 50 mg capsules   pregabalin (LYRICA) 50 MG capsule Take 1 capsule (50 mg total)  by mouth 3 (three) times daily. (Patient taking differently: Take 50 mg by mouth 3 (three) times daily. Along with 100 mg capsules)   repaglinide (PRANDIN)  2 MG tablet Take 1 tablet (2 mg total) by mouth 2 (two) times daily before a meal.   spironolactone (ALDACTONE) 25 MG tablet Take 1 tablet (25 mg total) by mouth daily.   temazepam (RESTORIL) 15 MG capsule TAKE ONE CAPSULE BY MOUTH EVERYDAY AT BEDTIME (Patient taking differently: Take 15 mg by mouth at bedtime.)   torsemide (DEMADEX) 20 MG tablet Take 2 tablets (40 mg total) in the morning and 1 tablet (20 mg total) in the evening.   traZODone (DESYREL) 100 MG tablet Take 2 tablets (200 mg total) by mouth at bedtime.   venlafaxine XR (EFFEXOR-XR) 75 MG 24 hr capsule Take 3 capsules (225 mg total) by mouth daily with breakfast.   vitamin B-12 1000 MCG tablet Take 1 tablet (1,000 mcg total) by mouth daily.   No facility-administered encounter medications on file as of 05/04/2021.  :   Review of Systems:  Out of a complete 14 point review of systems, all are reviewed and negative with the exception of these symptoms as listed below:  Review of Systems  Neurological:        Pt is here because he falls asleep a lot . Pt is on Bipap received 5 years ago and sleep study was done 10 years ago . Pt states snore at night and severe fatigue. Pt states his memory starting to go . Pt went to ED Monday and was admitted and pt stated that he was told he had a low b12   FSS:48 ESS:21   Objective:  Neurological Exam  Physical Exam Physical Examination:   Vitals:   05/04/21 1335  BP: 107/64  Pulse: 80   General Examination: The patient is a very pleasant 65 y.o. male in no acute distress. He appears well-developed and well-nourished and well groomed.  He is situated on an Transport planner.  HEENT: Normocephalic, atraumatic, pupils are equal, round and reactive to light, extraocular tracking well-preserved, speech is clear without dysarthria, hypophonia or voice tremor.  Neck without carotid bruits.  Airway examination reveals a small airway, redundant soft palate.  Mallampati class III.  Tonsils  absent.  Moderate to severe mouth dryness noted.  Tongue protrudes centrally and palate elevates symmetrically.  Neck circumference of 23 7/8 inches.    Chest: Clear to auscultation without wheezing, rhonchi or crackles noted.  Heart: S1+S2+0, regular and normal without murmurs, rubs or gallops noted.   Abdomen: Soft, non-tender and non-distended with normal bowel sounds appreciated on auscultation.  Extremities: There is swelling of both lower extremities, right more than left, mild erythema noted in the distal right lower extremity including right foot.    Skin: Warm and dry with dystrophic toenails.     Musculoskeletal: exam reveals scar left knee.    Neurologically:  Mental status: The patient is awake, alert and oriented in all 4 spheres. His immediate and remote memory, attention, language skills and fund of knowledge are appropriate. Thought process is linear. Mood is normal and affect is normal.  Cranial nerves II - XII are as described above under HEENT exam.  Motor exam: Normal bulk, able to move all 4 extremities.  Fine motor skills are grossly intact in the upper extremities.  I did not have him stand or walk for me today.  Assessment and Plan:  In summary, Lawrence [name] is a very pleasant 65 y.o.-year old male with an underlying medical of aortic stenosis, COPD, depression, anxiety, anemia, diabetes, neuropathy, hypertension, PTSD, hyperlipidemia, history of seizure-like activity, sinus tachycardia, and morbid obesity with a BMI of over 40, who presents for evaluation of his obstructive sleep apnea.  He was diagnosed through the [place] over 10 years ago and has a machine that is about 4 years ago by his estimation.  He did not bring his machine but recalls he has a BiPAP machine.  He reports compliance with treatment but does not have a very set sleep schedule or sleep habits, does not always sleep in the bedroom.  He is also at risk for polypharmacy given his multiple  sedating medications.  He was recently hospitalized this month and was advised to discuss his medication regimen with his prescriber or prescribers.  He is advised that his daytime somnolence could very well be secondary to medication effect and may be suboptimally treated sleep apnea including inconsistent usage.  I would like to review his compliance download.  He is agreeable to bringing the machine with its power cord at the next convenient occasion.  He does not drive and relies on public transportation.  He is advised to proceed with reevaluation with a home sleep test.  We will order the test and call him once we are ready to schedule.  We will plan a follow-up in sleep clinic afterwards.  He may not be eligible for new machine, we may be able to make adjustments to his current machine unless he needs a different treatment modality in which case I could prescribe a different treatment machine.  I answered all his questions today and he was in agreement with the plan.   Thank you very much for allowing me to participate in the care of this nice patient. If I can be of any further assistance to you please do not hesitate to talk to me.  Sincerely,   Lawrence Age, Lawrence [name], Lawrence [name]

## 2021-05-05 ENCOUNTER — Other Ambulatory Visit: Payer: Self-pay | Admitting: Neurology

## 2021-05-05 DIAGNOSIS — M792 Neuralgia and neuritis, unspecified: Secondary | ICD-10-CM

## 2021-05-07 ENCOUNTER — Other Ambulatory Visit: Payer: Self-pay | Admitting: Cardiology

## 2021-05-09 DIAGNOSIS — I5033 Acute on chronic diastolic (congestive) heart failure: Secondary | ICD-10-CM | POA: Diagnosis not present

## 2021-05-09 DIAGNOSIS — E1169 Type 2 diabetes mellitus with other specified complication: Secondary | ICD-10-CM | POA: Diagnosis not present

## 2021-05-09 DIAGNOSIS — G4733 Obstructive sleep apnea (adult) (pediatric): Secondary | ICD-10-CM | POA: Diagnosis not present

## 2021-05-09 DIAGNOSIS — R569 Unspecified convulsions: Secondary | ICD-10-CM | POA: Diagnosis not present

## 2021-05-09 DIAGNOSIS — E1165 Type 2 diabetes mellitus with hyperglycemia: Secondary | ICD-10-CM | POA: Diagnosis not present

## 2021-05-09 DIAGNOSIS — E1151 Type 2 diabetes mellitus with diabetic peripheral angiopathy without gangrene: Secondary | ICD-10-CM | POA: Diagnosis not present

## 2021-05-09 DIAGNOSIS — I130 Hypertensive heart and chronic kidney disease with heart failure and stage 1 through stage 4 chronic kidney disease, or unspecified chronic kidney disease: Secondary | ICD-10-CM | POA: Diagnosis not present

## 2021-05-09 DIAGNOSIS — M109 Gout, unspecified: Secondary | ICD-10-CM | POA: Diagnosis not present

## 2021-05-09 DIAGNOSIS — E785 Hyperlipidemia, unspecified: Secondary | ICD-10-CM | POA: Diagnosis not present

## 2021-05-09 DIAGNOSIS — I872 Venous insufficiency (chronic) (peripheral): Secondary | ICD-10-CM | POA: Diagnosis not present

## 2021-05-09 DIAGNOSIS — E1122 Type 2 diabetes mellitus with diabetic chronic kidney disease: Secondary | ICD-10-CM | POA: Diagnosis not present

## 2021-05-09 DIAGNOSIS — I69354 Hemiplegia and hemiparesis following cerebral infarction affecting left non-dominant side: Secondary | ICD-10-CM | POA: Diagnosis not present

## 2021-05-09 DIAGNOSIS — I4819 Other persistent atrial fibrillation: Secondary | ICD-10-CM | POA: Diagnosis not present

## 2021-05-09 DIAGNOSIS — I2510 Atherosclerotic heart disease of native coronary artery without angina pectoris: Secondary | ICD-10-CM | POA: Diagnosis not present

## 2021-05-09 DIAGNOSIS — E1140 Type 2 diabetes mellitus with diabetic neuropathy, unspecified: Secondary | ICD-10-CM | POA: Diagnosis not present

## 2021-05-09 DIAGNOSIS — N183 Chronic kidney disease, stage 3 unspecified: Secondary | ICD-10-CM | POA: Diagnosis not present

## 2021-05-09 DIAGNOSIS — I471 Supraventricular tachycardia: Secondary | ICD-10-CM | POA: Diagnosis not present

## 2021-05-09 DIAGNOSIS — J449 Chronic obstructive pulmonary disease, unspecified: Secondary | ICD-10-CM | POA: Diagnosis not present

## 2021-05-09 DIAGNOSIS — G4700 Insomnia, unspecified: Secondary | ICD-10-CM | POA: Diagnosis not present

## 2021-05-10 DIAGNOSIS — I2510 Atherosclerotic heart disease of native coronary artery without angina pectoris: Secondary | ICD-10-CM | POA: Diagnosis not present

## 2021-05-10 DIAGNOSIS — E1151 Type 2 diabetes mellitus with diabetic peripheral angiopathy without gangrene: Secondary | ICD-10-CM | POA: Diagnosis not present

## 2021-05-10 DIAGNOSIS — E1140 Type 2 diabetes mellitus with diabetic neuropathy, unspecified: Secondary | ICD-10-CM | POA: Diagnosis not present

## 2021-05-10 DIAGNOSIS — E1169 Type 2 diabetes mellitus with other specified complication: Secondary | ICD-10-CM | POA: Diagnosis not present

## 2021-05-10 DIAGNOSIS — G4733 Obstructive sleep apnea (adult) (pediatric): Secondary | ICD-10-CM | POA: Diagnosis not present

## 2021-05-10 DIAGNOSIS — I130 Hypertensive heart and chronic kidney disease with heart failure and stage 1 through stage 4 chronic kidney disease, or unspecified chronic kidney disease: Secondary | ICD-10-CM | POA: Diagnosis not present

## 2021-05-10 DIAGNOSIS — E1122 Type 2 diabetes mellitus with diabetic chronic kidney disease: Secondary | ICD-10-CM | POA: Diagnosis not present

## 2021-05-10 DIAGNOSIS — I4819 Other persistent atrial fibrillation: Secondary | ICD-10-CM | POA: Diagnosis not present

## 2021-05-10 DIAGNOSIS — G4700 Insomnia, unspecified: Secondary | ICD-10-CM | POA: Diagnosis not present

## 2021-05-10 DIAGNOSIS — R569 Unspecified convulsions: Secondary | ICD-10-CM | POA: Diagnosis not present

## 2021-05-10 DIAGNOSIS — E785 Hyperlipidemia, unspecified: Secondary | ICD-10-CM | POA: Diagnosis not present

## 2021-05-10 DIAGNOSIS — I69354 Hemiplegia and hemiparesis following cerebral infarction affecting left non-dominant side: Secondary | ICD-10-CM | POA: Diagnosis not present

## 2021-05-10 DIAGNOSIS — I5033 Acute on chronic diastolic (congestive) heart failure: Secondary | ICD-10-CM | POA: Diagnosis not present

## 2021-05-10 DIAGNOSIS — J449 Chronic obstructive pulmonary disease, unspecified: Secondary | ICD-10-CM | POA: Diagnosis not present

## 2021-05-10 DIAGNOSIS — N183 Chronic kidney disease, stage 3 unspecified: Secondary | ICD-10-CM | POA: Diagnosis not present

## 2021-05-10 DIAGNOSIS — E1165 Type 2 diabetes mellitus with hyperglycemia: Secondary | ICD-10-CM | POA: Diagnosis not present

## 2021-05-10 DIAGNOSIS — M109 Gout, unspecified: Secondary | ICD-10-CM | POA: Diagnosis not present

## 2021-05-10 DIAGNOSIS — I471 Supraventricular tachycardia: Secondary | ICD-10-CM | POA: Diagnosis not present

## 2021-05-10 DIAGNOSIS — I872 Venous insufficiency (chronic) (peripheral): Secondary | ICD-10-CM | POA: Diagnosis not present

## 2021-05-13 ENCOUNTER — Ambulatory Visit (HOSPITAL_COMMUNITY)
Admission: RE | Admit: 2021-05-13 | Discharge: 2021-05-13 | Disposition: A | Discharge status: Home / Self Care | Payer: Medicare Other | Source: Ambulatory Visit | Attending: Vascular Surgery | Admitting: Vascular Surgery

## 2021-05-13 ENCOUNTER — Other Ambulatory Visit: Payer: Self-pay

## 2021-05-13 ENCOUNTER — Ambulatory Visit: Payer: Medicare Other

## 2021-05-13 DIAGNOSIS — I872 Venous insufficiency (chronic) (peripheral): Secondary | ICD-10-CM | POA: Insufficient documentation

## 2021-05-15 DIAGNOSIS — I130 Hypertensive heart and chronic kidney disease with heart failure and stage 1 through stage 4 chronic kidney disease, or unspecified chronic kidney disease: Secondary | ICD-10-CM | POA: Diagnosis not present

## 2021-05-15 DIAGNOSIS — I2510 Atherosclerotic heart disease of native coronary artery without angina pectoris: Secondary | ICD-10-CM | POA: Diagnosis not present

## 2021-05-15 DIAGNOSIS — E1151 Type 2 diabetes mellitus with diabetic peripheral angiopathy without gangrene: Secondary | ICD-10-CM | POA: Diagnosis not present

## 2021-05-15 DIAGNOSIS — E785 Hyperlipidemia, unspecified: Secondary | ICD-10-CM | POA: Diagnosis not present

## 2021-05-15 DIAGNOSIS — N183 Chronic kidney disease, stage 3 unspecified: Secondary | ICD-10-CM | POA: Diagnosis not present

## 2021-05-15 DIAGNOSIS — E1122 Type 2 diabetes mellitus with diabetic chronic kidney disease: Secondary | ICD-10-CM | POA: Diagnosis not present

## 2021-05-15 DIAGNOSIS — J449 Chronic obstructive pulmonary disease, unspecified: Secondary | ICD-10-CM | POA: Diagnosis not present

## 2021-05-15 DIAGNOSIS — M109 Gout, unspecified: Secondary | ICD-10-CM | POA: Diagnosis not present

## 2021-05-15 DIAGNOSIS — G4733 Obstructive sleep apnea (adult) (pediatric): Secondary | ICD-10-CM | POA: Diagnosis not present

## 2021-05-15 DIAGNOSIS — E1169 Type 2 diabetes mellitus with other specified complication: Secondary | ICD-10-CM | POA: Diagnosis not present

## 2021-05-15 DIAGNOSIS — I5033 Acute on chronic diastolic (congestive) heart failure: Secondary | ICD-10-CM | POA: Diagnosis not present

## 2021-05-15 DIAGNOSIS — E1165 Type 2 diabetes mellitus with hyperglycemia: Secondary | ICD-10-CM | POA: Diagnosis not present

## 2021-05-15 DIAGNOSIS — I69354 Hemiplegia and hemiparesis following cerebral infarction affecting left non-dominant side: Secondary | ICD-10-CM | POA: Diagnosis not present

## 2021-05-15 DIAGNOSIS — E1140 Type 2 diabetes mellitus with diabetic neuropathy, unspecified: Secondary | ICD-10-CM | POA: Diagnosis not present

## 2021-05-15 DIAGNOSIS — I4819 Other persistent atrial fibrillation: Secondary | ICD-10-CM | POA: Diagnosis not present

## 2021-05-15 DIAGNOSIS — I471 Supraventricular tachycardia: Secondary | ICD-10-CM | POA: Diagnosis not present

## 2021-05-15 DIAGNOSIS — I872 Venous insufficiency (chronic) (peripheral): Secondary | ICD-10-CM | POA: Diagnosis not present

## 2021-05-15 DIAGNOSIS — R569 Unspecified convulsions: Secondary | ICD-10-CM | POA: Diagnosis not present

## 2021-05-15 DIAGNOSIS — G4700 Insomnia, unspecified: Secondary | ICD-10-CM | POA: Diagnosis not present

## 2021-05-17 DIAGNOSIS — M109 Gout, unspecified: Secondary | ICD-10-CM | POA: Diagnosis not present

## 2021-05-17 DIAGNOSIS — I2510 Atherosclerotic heart disease of native coronary artery without angina pectoris: Secondary | ICD-10-CM | POA: Diagnosis not present

## 2021-05-17 DIAGNOSIS — I872 Venous insufficiency (chronic) (peripheral): Secondary | ICD-10-CM | POA: Diagnosis not present

## 2021-05-17 DIAGNOSIS — G4733 Obstructive sleep apnea (adult) (pediatric): Secondary | ICD-10-CM | POA: Diagnosis not present

## 2021-05-17 DIAGNOSIS — I69354 Hemiplegia and hemiparesis following cerebral infarction affecting left non-dominant side: Secondary | ICD-10-CM | POA: Diagnosis not present

## 2021-05-17 DIAGNOSIS — E1169 Type 2 diabetes mellitus with other specified complication: Secondary | ICD-10-CM | POA: Diagnosis not present

## 2021-05-17 DIAGNOSIS — I4819 Other persistent atrial fibrillation: Secondary | ICD-10-CM | POA: Diagnosis not present

## 2021-05-17 DIAGNOSIS — N183 Chronic kidney disease, stage 3 unspecified: Secondary | ICD-10-CM | POA: Diagnosis not present

## 2021-05-17 DIAGNOSIS — J449 Chronic obstructive pulmonary disease, unspecified: Secondary | ICD-10-CM | POA: Diagnosis not present

## 2021-05-17 DIAGNOSIS — E1151 Type 2 diabetes mellitus with diabetic peripheral angiopathy without gangrene: Secondary | ICD-10-CM | POA: Diagnosis not present

## 2021-05-17 DIAGNOSIS — E785 Hyperlipidemia, unspecified: Secondary | ICD-10-CM | POA: Diagnosis not present

## 2021-05-17 DIAGNOSIS — E1122 Type 2 diabetes mellitus with diabetic chronic kidney disease: Secondary | ICD-10-CM | POA: Diagnosis not present

## 2021-05-17 DIAGNOSIS — R569 Unspecified convulsions: Secondary | ICD-10-CM | POA: Diagnosis not present

## 2021-05-17 DIAGNOSIS — I5033 Acute on chronic diastolic (congestive) heart failure: Secondary | ICD-10-CM | POA: Diagnosis not present

## 2021-05-17 DIAGNOSIS — E1165 Type 2 diabetes mellitus with hyperglycemia: Secondary | ICD-10-CM | POA: Diagnosis not present

## 2021-05-17 DIAGNOSIS — G4700 Insomnia, unspecified: Secondary | ICD-10-CM | POA: Diagnosis not present

## 2021-05-17 DIAGNOSIS — I471 Supraventricular tachycardia: Secondary | ICD-10-CM | POA: Diagnosis not present

## 2021-05-17 DIAGNOSIS — I130 Hypertensive heart and chronic kidney disease with heart failure and stage 1 through stage 4 chronic kidney disease, or unspecified chronic kidney disease: Secondary | ICD-10-CM | POA: Diagnosis not present

## 2021-05-17 DIAGNOSIS — E1140 Type 2 diabetes mellitus with diabetic neuropathy, unspecified: Secondary | ICD-10-CM | POA: Diagnosis not present

## 2021-05-19 ENCOUNTER — Telehealth: Payer: Self-pay

## 2021-05-19 NOTE — Telephone Encounter (Signed)
LVM for pt to call me back to schedule sleep study  

## 2021-05-20 ENCOUNTER — Other Ambulatory Visit: Payer: Self-pay

## 2021-05-20 MED ORDER — METOPROLOL SUCCINATE ER 50 MG PO TB24: 50.0000 mg | ORAL_TABLET | Freq: Every day | ORAL | 2 refills | Status: DC

## 2021-05-20 NOTE — Telephone Encounter (Signed)
Pt's medication was sent to pt's pharmacy as requested. Confirmation received.  °

## 2021-05-21 DIAGNOSIS — E1151 Type 2 diabetes mellitus with diabetic peripheral angiopathy without gangrene: Secondary | ICD-10-CM | POA: Diagnosis not present

## 2021-05-21 DIAGNOSIS — E1165 Type 2 diabetes mellitus with hyperglycemia: Secondary | ICD-10-CM | POA: Diagnosis not present

## 2021-05-21 DIAGNOSIS — J449 Chronic obstructive pulmonary disease, unspecified: Secondary | ICD-10-CM | POA: Diagnosis not present

## 2021-05-21 DIAGNOSIS — G4733 Obstructive sleep apnea (adult) (pediatric): Secondary | ICD-10-CM | POA: Diagnosis not present

## 2021-05-21 DIAGNOSIS — I471 Supraventricular tachycardia: Secondary | ICD-10-CM | POA: Diagnosis not present

## 2021-05-21 DIAGNOSIS — E1169 Type 2 diabetes mellitus with other specified complication: Secondary | ICD-10-CM | POA: Diagnosis not present

## 2021-05-21 DIAGNOSIS — I5033 Acute on chronic diastolic (congestive) heart failure: Secondary | ICD-10-CM | POA: Diagnosis not present

## 2021-05-21 DIAGNOSIS — I872 Venous insufficiency (chronic) (peripheral): Secondary | ICD-10-CM | POA: Diagnosis not present

## 2021-05-21 DIAGNOSIS — I130 Hypertensive heart and chronic kidney disease with heart failure and stage 1 through stage 4 chronic kidney disease, or unspecified chronic kidney disease: Secondary | ICD-10-CM | POA: Diagnosis not present

## 2021-05-21 DIAGNOSIS — I69354 Hemiplegia and hemiparesis following cerebral infarction affecting left non-dominant side: Secondary | ICD-10-CM | POA: Diagnosis not present

## 2021-05-21 DIAGNOSIS — R569 Unspecified convulsions: Secondary | ICD-10-CM | POA: Diagnosis not present

## 2021-05-21 DIAGNOSIS — M109 Gout, unspecified: Secondary | ICD-10-CM | POA: Diagnosis not present

## 2021-05-21 DIAGNOSIS — E1122 Type 2 diabetes mellitus with diabetic chronic kidney disease: Secondary | ICD-10-CM | POA: Diagnosis not present

## 2021-05-21 DIAGNOSIS — N183 Chronic kidney disease, stage 3 unspecified: Secondary | ICD-10-CM | POA: Diagnosis not present

## 2021-05-21 DIAGNOSIS — I4819 Other persistent atrial fibrillation: Secondary | ICD-10-CM | POA: Diagnosis not present

## 2021-05-21 DIAGNOSIS — E1140 Type 2 diabetes mellitus with diabetic neuropathy, unspecified: Secondary | ICD-10-CM | POA: Diagnosis not present

## 2021-05-21 DIAGNOSIS — I2510 Atherosclerotic heart disease of native coronary artery without angina pectoris: Secondary | ICD-10-CM | POA: Diagnosis not present

## 2021-05-21 DIAGNOSIS — E785 Hyperlipidemia, unspecified: Secondary | ICD-10-CM | POA: Diagnosis not present

## 2021-05-21 DIAGNOSIS — G4700 Insomnia, unspecified: Secondary | ICD-10-CM | POA: Diagnosis not present

## 2021-05-22 DIAGNOSIS — I5032 Chronic diastolic (congestive) heart failure: Secondary | ICD-10-CM

## 2021-05-22 DIAGNOSIS — I10 Essential (primary) hypertension: Secondary | ICD-10-CM

## 2021-05-22 DIAGNOSIS — R0602 Shortness of breath: Secondary | ICD-10-CM

## 2021-05-24 ENCOUNTER — Ambulatory Visit (INDEPENDENT_AMBULATORY_CARE_PROVIDER_SITE_OTHER): Payer: Medicare Other | Admitting: Neurology

## 2021-05-24 DIAGNOSIS — Z79899 Other long term (current) drug therapy: Secondary | ICD-10-CM

## 2021-05-24 DIAGNOSIS — G4733 Obstructive sleep apnea (adult) (pediatric): Secondary | ICD-10-CM

## 2021-05-24 DIAGNOSIS — Z6841 Body Mass Index (BMI) 40.0 and over, adult: Secondary | ICD-10-CM

## 2021-05-24 DIAGNOSIS — G4719 Other hypersomnia: Secondary | ICD-10-CM

## 2021-05-24 DIAGNOSIS — G4734 Idiopathic sleep related nonobstructive alveolar hypoventilation: Secondary | ICD-10-CM

## 2021-05-26 NOTE — Telephone Encounter (Signed)
Since he has 1 more day at 40mg  BID have him call on Friday to let us know his weight

## 2021-05-27 ENCOUNTER — Telehealth: Payer: Self-pay | Admitting: Neurology

## 2021-05-27 DIAGNOSIS — I5033 Acute on chronic diastolic (congestive) heart failure: Secondary | ICD-10-CM | POA: Diagnosis not present

## 2021-05-27 DIAGNOSIS — E1169 Type 2 diabetes mellitus with other specified complication: Secondary | ICD-10-CM | POA: Diagnosis not present

## 2021-05-27 DIAGNOSIS — G4733 Obstructive sleep apnea (adult) (pediatric): Secondary | ICD-10-CM | POA: Diagnosis not present

## 2021-05-27 DIAGNOSIS — I471 Supraventricular tachycardia: Secondary | ICD-10-CM | POA: Diagnosis not present

## 2021-05-27 DIAGNOSIS — I2510 Atherosclerotic heart disease of native coronary artery without angina pectoris: Secondary | ICD-10-CM | POA: Diagnosis not present

## 2021-05-27 DIAGNOSIS — I69354 Hemiplegia and hemiparesis following cerebral infarction affecting left non-dominant side: Secondary | ICD-10-CM | POA: Diagnosis not present

## 2021-05-27 DIAGNOSIS — I130 Hypertensive heart and chronic kidney disease with heart failure and stage 1 through stage 4 chronic kidney disease, or unspecified chronic kidney disease: Secondary | ICD-10-CM | POA: Diagnosis not present

## 2021-05-27 DIAGNOSIS — I872 Venous insufficiency (chronic) (peripheral): Secondary | ICD-10-CM | POA: Diagnosis not present

## 2021-05-27 DIAGNOSIS — E1140 Type 2 diabetes mellitus with diabetic neuropathy, unspecified: Secondary | ICD-10-CM | POA: Diagnosis not present

## 2021-05-27 DIAGNOSIS — J449 Chronic obstructive pulmonary disease, unspecified: Secondary | ICD-10-CM | POA: Diagnosis not present

## 2021-05-27 DIAGNOSIS — E1165 Type 2 diabetes mellitus with hyperglycemia: Secondary | ICD-10-CM | POA: Diagnosis not present

## 2021-05-27 DIAGNOSIS — N183 Chronic kidney disease, stage 3 unspecified: Secondary | ICD-10-CM | POA: Diagnosis not present

## 2021-05-27 DIAGNOSIS — I4819 Other persistent atrial fibrillation: Secondary | ICD-10-CM | POA: Diagnosis not present

## 2021-05-27 DIAGNOSIS — G4700 Insomnia, unspecified: Secondary | ICD-10-CM | POA: Diagnosis not present

## 2021-05-27 DIAGNOSIS — R569 Unspecified convulsions: Secondary | ICD-10-CM | POA: Diagnosis not present

## 2021-05-27 DIAGNOSIS — E1151 Type 2 diabetes mellitus with diabetic peripheral angiopathy without gangrene: Secondary | ICD-10-CM | POA: Diagnosis not present

## 2021-05-27 DIAGNOSIS — E785 Hyperlipidemia, unspecified: Secondary | ICD-10-CM | POA: Diagnosis not present

## 2021-05-27 DIAGNOSIS — E1122 Type 2 diabetes mellitus with diabetic chronic kidney disease: Secondary | ICD-10-CM | POA: Diagnosis not present

## 2021-05-27 DIAGNOSIS — M109 Gout, unspecified: Secondary | ICD-10-CM | POA: Diagnosis not present

## 2021-05-27 NOTE — Telephone Encounter (Signed)
Melissa with [employer] called wanting to know what is the correct dosage for pt's pregabalin (LYRICA) 100 MG capsule and pregabalin (LYRICA) 50 MG capsule. Melissa requesting a call back at [phone] ext [phone].

## 2021-05-27 NOTE — Telephone Encounter (Signed)
I returned the call to [facility] w/ [facility] at the number and extension below. I left a message on her voicemail confirming the doses.  In review of patient's chart, he is taking both pregabalin 50mg , one cap TID and pregabalin 100mg , one cap TID. Total of 150mg  TID.

## 2021-05-29 DIAGNOSIS — I2510 Atherosclerotic heart disease of native coronary artery without angina pectoris: Secondary | ICD-10-CM | POA: Diagnosis not present

## 2021-05-29 DIAGNOSIS — E785 Hyperlipidemia, unspecified: Secondary | ICD-10-CM | POA: Diagnosis not present

## 2021-05-29 DIAGNOSIS — E1151 Type 2 diabetes mellitus with diabetic peripheral angiopathy without gangrene: Secondary | ICD-10-CM | POA: Diagnosis not present

## 2021-05-29 DIAGNOSIS — E1140 Type 2 diabetes mellitus with diabetic neuropathy, unspecified: Secondary | ICD-10-CM | POA: Diagnosis not present

## 2021-05-29 DIAGNOSIS — E1165 Type 2 diabetes mellitus with hyperglycemia: Secondary | ICD-10-CM | POA: Diagnosis not present

## 2021-05-29 DIAGNOSIS — E1122 Type 2 diabetes mellitus with diabetic chronic kidney disease: Secondary | ICD-10-CM | POA: Diagnosis not present

## 2021-05-29 DIAGNOSIS — I130 Hypertensive heart and chronic kidney disease with heart failure and stage 1 through stage 4 chronic kidney disease, or unspecified chronic kidney disease: Secondary | ICD-10-CM | POA: Diagnosis not present

## 2021-05-29 DIAGNOSIS — I5033 Acute on chronic diastolic (congestive) heart failure: Secondary | ICD-10-CM | POA: Diagnosis not present

## 2021-05-29 DIAGNOSIS — I872 Venous insufficiency (chronic) (peripheral): Secondary | ICD-10-CM | POA: Diagnosis not present

## 2021-05-29 DIAGNOSIS — N183 Chronic kidney disease, stage 3 unspecified: Secondary | ICD-10-CM | POA: Diagnosis not present

## 2021-05-29 DIAGNOSIS — I471 Supraventricular tachycardia: Secondary | ICD-10-CM | POA: Diagnosis not present

## 2021-05-29 DIAGNOSIS — E1169 Type 2 diabetes mellitus with other specified complication: Secondary | ICD-10-CM | POA: Diagnosis not present

## 2021-05-29 DIAGNOSIS — I4819 Other persistent atrial fibrillation: Secondary | ICD-10-CM | POA: Diagnosis not present

## 2021-05-29 DIAGNOSIS — G4733 Obstructive sleep apnea (adult) (pediatric): Secondary | ICD-10-CM | POA: Diagnosis not present

## 2021-05-29 DIAGNOSIS — G4700 Insomnia, unspecified: Secondary | ICD-10-CM | POA: Diagnosis not present

## 2021-05-29 DIAGNOSIS — M109 Gout, unspecified: Secondary | ICD-10-CM | POA: Diagnosis not present

## 2021-05-29 DIAGNOSIS — R569 Unspecified convulsions: Secondary | ICD-10-CM | POA: Diagnosis not present

## 2021-05-29 DIAGNOSIS — J449 Chronic obstructive pulmonary disease, unspecified: Secondary | ICD-10-CM | POA: Diagnosis not present

## 2021-05-29 DIAGNOSIS — I69354 Hemiplegia and hemiparesis following cerebral infarction affecting left non-dominant side: Secondary | ICD-10-CM | POA: Diagnosis not present

## 2021-05-31 DIAGNOSIS — E1151 Type 2 diabetes mellitus with diabetic peripheral angiopathy without gangrene: Secondary | ICD-10-CM | POA: Diagnosis not present

## 2021-05-31 DIAGNOSIS — I471 Supraventricular tachycardia: Secondary | ICD-10-CM | POA: Diagnosis not present

## 2021-05-31 DIAGNOSIS — E785 Hyperlipidemia, unspecified: Secondary | ICD-10-CM | POA: Diagnosis not present

## 2021-05-31 DIAGNOSIS — E1140 Type 2 diabetes mellitus with diabetic neuropathy, unspecified: Secondary | ICD-10-CM | POA: Diagnosis not present

## 2021-05-31 DIAGNOSIS — G4733 Obstructive sleep apnea (adult) (pediatric): Secondary | ICD-10-CM | POA: Diagnosis not present

## 2021-05-31 DIAGNOSIS — M109 Gout, unspecified: Secondary | ICD-10-CM | POA: Diagnosis not present

## 2021-05-31 DIAGNOSIS — R569 Unspecified convulsions: Secondary | ICD-10-CM | POA: Diagnosis not present

## 2021-05-31 DIAGNOSIS — E1169 Type 2 diabetes mellitus with other specified complication: Secondary | ICD-10-CM | POA: Diagnosis not present

## 2021-05-31 DIAGNOSIS — I2510 Atherosclerotic heart disease of native coronary artery without angina pectoris: Secondary | ICD-10-CM | POA: Diagnosis not present

## 2021-05-31 DIAGNOSIS — I872 Venous insufficiency (chronic) (peripheral): Secondary | ICD-10-CM | POA: Diagnosis not present

## 2021-05-31 DIAGNOSIS — I69354 Hemiplegia and hemiparesis following cerebral infarction affecting left non-dominant side: Secondary | ICD-10-CM | POA: Diagnosis not present

## 2021-05-31 DIAGNOSIS — I5033 Acute on chronic diastolic (congestive) heart failure: Secondary | ICD-10-CM | POA: Diagnosis not present

## 2021-05-31 DIAGNOSIS — G4700 Insomnia, unspecified: Secondary | ICD-10-CM | POA: Diagnosis not present

## 2021-05-31 DIAGNOSIS — E1165 Type 2 diabetes mellitus with hyperglycemia: Secondary | ICD-10-CM | POA: Diagnosis not present

## 2021-05-31 DIAGNOSIS — J449 Chronic obstructive pulmonary disease, unspecified: Secondary | ICD-10-CM | POA: Diagnosis not present

## 2021-05-31 DIAGNOSIS — I130 Hypertensive heart and chronic kidney disease with heart failure and stage 1 through stage 4 chronic kidney disease, or unspecified chronic kidney disease: Secondary | ICD-10-CM | POA: Diagnosis not present

## 2021-05-31 DIAGNOSIS — N183 Chronic kidney disease, stage 3 unspecified: Secondary | ICD-10-CM | POA: Diagnosis not present

## 2021-05-31 DIAGNOSIS — I4819 Other persistent atrial fibrillation: Secondary | ICD-10-CM | POA: Diagnosis not present

## 2021-05-31 DIAGNOSIS — E1122 Type 2 diabetes mellitus with diabetic chronic kidney disease: Secondary | ICD-10-CM | POA: Diagnosis not present

## 2021-05-31 NOTE — Progress Notes (Signed)
See procedure note.

## 2021-06-01 DIAGNOSIS — E1165 Type 2 diabetes mellitus with hyperglycemia: Secondary | ICD-10-CM | POA: Diagnosis not present

## 2021-06-01 DIAGNOSIS — I471 Supraventricular tachycardia: Secondary | ICD-10-CM | POA: Diagnosis not present

## 2021-06-01 DIAGNOSIS — I69354 Hemiplegia and hemiparesis following cerebral infarction affecting left non-dominant side: Secondary | ICD-10-CM | POA: Diagnosis not present

## 2021-06-01 DIAGNOSIS — E1122 Type 2 diabetes mellitus with diabetic chronic kidney disease: Secondary | ICD-10-CM | POA: Diagnosis not present

## 2021-06-01 DIAGNOSIS — E1169 Type 2 diabetes mellitus with other specified complication: Secondary | ICD-10-CM | POA: Diagnosis not present

## 2021-06-01 DIAGNOSIS — I872 Venous insufficiency (chronic) (peripheral): Secondary | ICD-10-CM | POA: Diagnosis not present

## 2021-06-01 DIAGNOSIS — I5033 Acute on chronic diastolic (congestive) heart failure: Secondary | ICD-10-CM | POA: Diagnosis not present

## 2021-06-01 DIAGNOSIS — I4819 Other persistent atrial fibrillation: Secondary | ICD-10-CM | POA: Diagnosis not present

## 2021-06-01 DIAGNOSIS — E785 Hyperlipidemia, unspecified: Secondary | ICD-10-CM | POA: Diagnosis not present

## 2021-06-01 DIAGNOSIS — I2510 Atherosclerotic heart disease of native coronary artery without angina pectoris: Secondary | ICD-10-CM | POA: Diagnosis not present

## 2021-06-01 DIAGNOSIS — E1140 Type 2 diabetes mellitus with diabetic neuropathy, unspecified: Secondary | ICD-10-CM | POA: Diagnosis not present

## 2021-06-01 DIAGNOSIS — J449 Chronic obstructive pulmonary disease, unspecified: Secondary | ICD-10-CM | POA: Diagnosis not present

## 2021-06-01 DIAGNOSIS — E1151 Type 2 diabetes mellitus with diabetic peripheral angiopathy without gangrene: Secondary | ICD-10-CM | POA: Diagnosis not present

## 2021-06-01 DIAGNOSIS — G4700 Insomnia, unspecified: Secondary | ICD-10-CM | POA: Diagnosis not present

## 2021-06-01 DIAGNOSIS — M109 Gout, unspecified: Secondary | ICD-10-CM | POA: Diagnosis not present

## 2021-06-01 DIAGNOSIS — R569 Unspecified convulsions: Secondary | ICD-10-CM | POA: Diagnosis not present

## 2021-06-01 DIAGNOSIS — N183 Chronic kidney disease, stage 3 unspecified: Secondary | ICD-10-CM | POA: Diagnosis not present

## 2021-06-01 DIAGNOSIS — I130 Hypertensive heart and chronic kidney disease with heart failure and stage 1 through stage 4 chronic kidney disease, or unspecified chronic kidney disease: Secondary | ICD-10-CM | POA: Diagnosis not present

## 2021-06-01 DIAGNOSIS — G4733 Obstructive sleep apnea (adult) (pediatric): Secondary | ICD-10-CM | POA: Diagnosis not present

## 2021-06-03 DIAGNOSIS — I471 Supraventricular tachycardia: Secondary | ICD-10-CM | POA: Diagnosis not present

## 2021-06-03 DIAGNOSIS — I4819 Other persistent atrial fibrillation: Secondary | ICD-10-CM | POA: Diagnosis not present

## 2021-06-03 DIAGNOSIS — I69354 Hemiplegia and hemiparesis following cerebral infarction affecting left non-dominant side: Secondary | ICD-10-CM | POA: Diagnosis not present

## 2021-06-03 DIAGNOSIS — E1151 Type 2 diabetes mellitus with diabetic peripheral angiopathy without gangrene: Secondary | ICD-10-CM | POA: Diagnosis not present

## 2021-06-03 DIAGNOSIS — N183 Chronic kidney disease, stage 3 unspecified: Secondary | ICD-10-CM | POA: Diagnosis not present

## 2021-06-03 DIAGNOSIS — E785 Hyperlipidemia, unspecified: Secondary | ICD-10-CM | POA: Diagnosis not present

## 2021-06-03 DIAGNOSIS — E1165 Type 2 diabetes mellitus with hyperglycemia: Secondary | ICD-10-CM | POA: Diagnosis not present

## 2021-06-03 DIAGNOSIS — E1169 Type 2 diabetes mellitus with other specified complication: Secondary | ICD-10-CM | POA: Diagnosis not present

## 2021-06-03 DIAGNOSIS — J449 Chronic obstructive pulmonary disease, unspecified: Secondary | ICD-10-CM | POA: Diagnosis not present

## 2021-06-03 DIAGNOSIS — I872 Venous insufficiency (chronic) (peripheral): Secondary | ICD-10-CM | POA: Diagnosis not present

## 2021-06-03 DIAGNOSIS — M109 Gout, unspecified: Secondary | ICD-10-CM | POA: Diagnosis not present

## 2021-06-03 DIAGNOSIS — I2510 Atherosclerotic heart disease of native coronary artery without angina pectoris: Secondary | ICD-10-CM | POA: Diagnosis not present

## 2021-06-03 DIAGNOSIS — G4733 Obstructive sleep apnea (adult) (pediatric): Secondary | ICD-10-CM | POA: Diagnosis not present

## 2021-06-03 DIAGNOSIS — E1140 Type 2 diabetes mellitus with diabetic neuropathy, unspecified: Secondary | ICD-10-CM | POA: Diagnosis not present

## 2021-06-03 DIAGNOSIS — E1122 Type 2 diabetes mellitus with diabetic chronic kidney disease: Secondary | ICD-10-CM | POA: Diagnosis not present

## 2021-06-03 DIAGNOSIS — I130 Hypertensive heart and chronic kidney disease with heart failure and stage 1 through stage 4 chronic kidney disease, or unspecified chronic kidney disease: Secondary | ICD-10-CM | POA: Diagnosis not present

## 2021-06-03 DIAGNOSIS — R569 Unspecified convulsions: Secondary | ICD-10-CM | POA: Diagnosis not present

## 2021-06-03 DIAGNOSIS — I5033 Acute on chronic diastolic (congestive) heart failure: Secondary | ICD-10-CM | POA: Diagnosis not present

## 2021-06-03 DIAGNOSIS — G4700 Insomnia, unspecified: Secondary | ICD-10-CM | POA: Diagnosis not present

## 2021-06-03 NOTE — Procedures (Signed)
Lawrence [name]  HOME SLEEP TEST (Watch PAT) REPORT  STUDY DATE: 05/24/2021  DOB: 12-18-1955  MRN: [id number]  ORDERING CLINICIAN: Star Age, MD, PhD   REFERRING CLINICIAN: Dr. Alric Ran  CLINICAL INFORMATION/HISTORY: 65 year old right-handed gentleman with an underlying medical of aortic stenosis, COPD, depression, anxiety, anemia, diabetes, neuropathy, hypertension, PTSD, hyperlipidemia, history of seizure-like activity, sinus tachycardia, and morbid obesity with a BMI of over 59, who was previously diagnosed with obstructive sleep apnea and placed on BiPAP therapy.  He presents for reevaluation of his sleep apnea.  Epworth sleepiness score: 21/24.  BMI: 29.5 kg/m  FINDINGS:   Sleep Summary:   Total Recording Time (hours, min): 9 hours, 43 minutes  Total Sleep Time (hours, min):  8 hours, 37 minutes   Percent REM (%):    24.2%   Respiratory Indices:   Calculated pAHI (per hour):  81.7/hour         REM pAHI:    90.1/hour       NREM pAHI: 78.9/hour  Oxygen Saturation Statistics:    Oxygen Saturation (%) Mean: 87%   Minimum oxygen saturation (%):                 73%   O2 Saturation Range (%): 73-95%    O2 Saturation (minutes) <=88%: 351.9 min  Pulse Rate Statistics:   Pulse Mean (bpm):    88/min    Pulse Range (41-120/min)   IMPRESSION: OSA (obstructive sleep apnea), severe Nocturnal hypoxemia  RECOMMENDATION:  This home sleep test demonstrates severe obstructive sleep apnea with a total AHI of 81.7/hour and O2 nadir of 73%.  Of note, he had a significant time below or at 88% saturation, consistent with nocturnal hypoxemia with an average oxygen saturation of only 87%.  The patient will be advised to seek evaluation with a pulmonologist.  Snoring was variable, ranging from mild to loud.  I recommend treatment with an AutoPap machine.  If he is eligible for new equipment, I will prescribe AutoPap therapy for now.  He may benefit from  a full night CPAP titration study for proper treatment settings, O2 monitoring and mask fitting. For now, the patient will be advised to proceed with an autoPAP titration/trial at home.  A laboratory attended titration study can be considered in the future for optimization of his treatment and better tolerance of therapy.  Alternative treatment options are limited secondary to the severity of the patient's sleep disordered breathing.  Please note, that untreated obstructive sleep apnea may carry additional perioperative morbidity. Patients with significant obstructive sleep apnea should receive perioperative PAP therapy and the surgeons and particularly the anesthesiologist should be informed of the diagnosis and the severity of the sleep disordered breathing. The patient should be cautioned not to drive, work at heights, or operate dangerous or heavy equipment when tired or sleepy. Review and reiteration of good sleep hygiene measures should be pursued with any patient. Other causes of the patient's symptoms, including circadian rhythm disturbances, an underlying mood disorder, medication effect and/or an underlying medical problem cannot be ruled out based on this test. Clinical correlation is recommended. The patient and his referring provider will be notified of the test results. The patient will be seen in follow up in sleep clinic at [facility].  I certify that I have reviewed the raw data recording prior to the issuance of this report in accordance with the standards of the American Academy of Sleep Medicine (AASM).  INTERPRETING PHYSICIAN:   Star Age,  MD, PhD  Board Certified in Neurology and [name] [address], [place], [place] [address] [phone]

## 2021-06-03 NOTE — Addendum Note (Signed)
Addended by: Star Age on: 06/03/2021 08:16 AM   Modules accepted: Orders

## 2021-06-04 DIAGNOSIS — N183 Chronic kidney disease, stage 3 unspecified: Secondary | ICD-10-CM | POA: Diagnosis not present

## 2021-06-04 DIAGNOSIS — M25561 Pain in right knee: Secondary | ICD-10-CM | POA: Diagnosis not present

## 2021-06-04 DIAGNOSIS — I5033 Acute on chronic diastolic (congestive) heart failure: Secondary | ICD-10-CM | POA: Diagnosis not present

## 2021-06-04 DIAGNOSIS — I130 Hypertensive heart and chronic kidney disease with heart failure and stage 1 through stage 4 chronic kidney disease, or unspecified chronic kidney disease: Secondary | ICD-10-CM | POA: Diagnosis not present

## 2021-06-04 DIAGNOSIS — E1165 Type 2 diabetes mellitus with hyperglycemia: Secondary | ICD-10-CM | POA: Diagnosis not present

## 2021-06-04 DIAGNOSIS — I69328 Other speech and language deficits following cerebral infarction: Secondary | ICD-10-CM | POA: Diagnosis not present

## 2021-06-04 DIAGNOSIS — J449 Chronic obstructive pulmonary disease, unspecified: Secondary | ICD-10-CM | POA: Diagnosis not present

## 2021-06-04 DIAGNOSIS — I872 Venous insufficiency (chronic) (peripheral): Secondary | ICD-10-CM | POA: Diagnosis not present

## 2021-06-04 DIAGNOSIS — D631 Anemia in chronic kidney disease: Secondary | ICD-10-CM | POA: Diagnosis not present

## 2021-06-04 DIAGNOSIS — I69354 Hemiplegia and hemiparesis following cerebral infarction affecting left non-dominant side: Secondary | ICD-10-CM | POA: Diagnosis not present

## 2021-06-04 DIAGNOSIS — E1122 Type 2 diabetes mellitus with diabetic chronic kidney disease: Secondary | ICD-10-CM | POA: Diagnosis not present

## 2021-06-04 DIAGNOSIS — E1142 Type 2 diabetes mellitus with diabetic polyneuropathy: Secondary | ICD-10-CM | POA: Diagnosis not present

## 2021-06-06 DIAGNOSIS — G4700 Insomnia, unspecified: Secondary | ICD-10-CM | POA: Diagnosis not present

## 2021-06-06 DIAGNOSIS — G459 Transient cerebral ischemic attack, unspecified: Secondary | ICD-10-CM | POA: Diagnosis not present

## 2021-06-06 DIAGNOSIS — J449 Chronic obstructive pulmonary disease, unspecified: Secondary | ICD-10-CM | POA: Diagnosis not present

## 2021-06-06 DIAGNOSIS — I480 Paroxysmal atrial fibrillation: Secondary | ICD-10-CM | POA: Diagnosis not present

## 2021-06-06 DIAGNOSIS — E1169 Type 2 diabetes mellitus with other specified complication: Secondary | ICD-10-CM | POA: Diagnosis not present

## 2021-06-06 DIAGNOSIS — M1990 Unspecified osteoarthritis, unspecified site: Secondary | ICD-10-CM | POA: Diagnosis not present

## 2021-06-06 DIAGNOSIS — I5032 Chronic diastolic (congestive) heart failure: Secondary | ICD-10-CM | POA: Diagnosis not present

## 2021-06-06 DIAGNOSIS — E785 Hyperlipidemia, unspecified: Secondary | ICD-10-CM | POA: Diagnosis not present

## 2021-06-06 DIAGNOSIS — I10 Essential (primary) hypertension: Secondary | ICD-10-CM | POA: Diagnosis not present

## 2021-06-06 DIAGNOSIS — E1165 Type 2 diabetes mellitus with hyperglycemia: Secondary | ICD-10-CM | POA: Diagnosis not present

## 2021-06-07 ENCOUNTER — Emergency Department (HOSPITAL_COMMUNITY): Payer: No Typology Code available for payment source

## 2021-06-07 ENCOUNTER — Inpatient Hospital Stay (HOSPITAL_COMMUNITY)
Admission: EM | Admit: 2021-06-07 | Discharge: 2021-06-10 | DRG: 291 | Disposition: A | Discharge status: Home Health | Payer: No Typology Code available for payment source | Attending: Internal Medicine | Admitting: Internal Medicine

## 2021-06-07 DIAGNOSIS — R600 Localized edema: Secondary | ICD-10-CM

## 2021-06-07 DIAGNOSIS — Z7984 Long term (current) use of oral hypoglycemic drugs: Secondary | ICD-10-CM

## 2021-06-07 DIAGNOSIS — R609 Edema, unspecified: Secondary | ICD-10-CM

## 2021-06-07 DIAGNOSIS — E1142 Type 2 diabetes mellitus with diabetic polyneuropathy: Secondary | ICD-10-CM | POA: Diagnosis not present

## 2021-06-07 DIAGNOSIS — Z20822 Contact with and (suspected) exposure to covid-19: Secondary | ICD-10-CM | POA: Diagnosis present

## 2021-06-07 DIAGNOSIS — R0902 Hypoxemia: Secondary | ICD-10-CM | POA: Diagnosis present

## 2021-06-07 DIAGNOSIS — G4733 Obstructive sleep apnea (adult) (pediatric): Secondary | ICD-10-CM | POA: Diagnosis not present

## 2021-06-07 DIAGNOSIS — F39 Unspecified mood [affective] disorder: Secondary | ICD-10-CM | POA: Diagnosis present

## 2021-06-07 DIAGNOSIS — R6889 Other general symptoms and signs: Secondary | ICD-10-CM | POA: Diagnosis not present

## 2021-06-07 DIAGNOSIS — R0602 Shortness of breath: Secondary | ICD-10-CM | POA: Diagnosis not present

## 2021-06-07 DIAGNOSIS — F419 Anxiety disorder, unspecified: Secondary | ICD-10-CM | POA: Diagnosis present

## 2021-06-07 DIAGNOSIS — Z794 Long term (current) use of insulin: Secondary | ICD-10-CM

## 2021-06-07 DIAGNOSIS — E7800 Pure hypercholesterolemia, unspecified: Secondary | ICD-10-CM | POA: Diagnosis present

## 2021-06-07 DIAGNOSIS — Z6841 Body Mass Index (BMI) 40.0 and over, adult: Secondary | ICD-10-CM

## 2021-06-07 DIAGNOSIS — I2510 Atherosclerotic heart disease of native coronary artery without angina pectoris: Secondary | ICD-10-CM | POA: Diagnosis not present

## 2021-06-07 DIAGNOSIS — F4310 Post-traumatic stress disorder, unspecified: Secondary | ICD-10-CM | POA: Diagnosis present

## 2021-06-07 DIAGNOSIS — Z66 Do not resuscitate: Secondary | ICD-10-CM | POA: Diagnosis present

## 2021-06-07 DIAGNOSIS — Z96652 Presence of left artificial knee joint: Secondary | ICD-10-CM | POA: Diagnosis present

## 2021-06-07 DIAGNOSIS — I878 Other specified disorders of veins: Secondary | ICD-10-CM | POA: Diagnosis present

## 2021-06-07 DIAGNOSIS — N400 Enlarged prostate without lower urinary tract symptoms: Secondary | ICD-10-CM | POA: Diagnosis present

## 2021-06-07 DIAGNOSIS — I5033 Acute on chronic diastolic (congestive) heart failure: Secondary | ICD-10-CM | POA: Diagnosis not present

## 2021-06-07 DIAGNOSIS — J449 Chronic obstructive pulmonary disease, unspecified: Secondary | ICD-10-CM | POA: Diagnosis present

## 2021-06-07 DIAGNOSIS — L03116 Cellulitis of left lower limb: Secondary | ICD-10-CM | POA: Diagnosis not present

## 2021-06-07 DIAGNOSIS — K219 Gastro-esophageal reflux disease without esophagitis: Secondary | ICD-10-CM | POA: Diagnosis present

## 2021-06-07 DIAGNOSIS — J9811 Atelectasis: Secondary | ICD-10-CM | POA: Diagnosis not present

## 2021-06-07 DIAGNOSIS — Z79899 Other long term (current) drug therapy: Secondary | ICD-10-CM

## 2021-06-07 DIAGNOSIS — G894 Chronic pain syndrome: Secondary | ICD-10-CM | POA: Diagnosis present

## 2021-06-07 DIAGNOSIS — M109 Gout, unspecified: Secondary | ICD-10-CM | POA: Diagnosis not present

## 2021-06-07 DIAGNOSIS — I110 Hypertensive heart disease with heart failure: Secondary | ICD-10-CM | POA: Diagnosis not present

## 2021-06-07 DIAGNOSIS — I509 Heart failure, unspecified: Secondary | ICD-10-CM | POA: Diagnosis not present

## 2021-06-07 DIAGNOSIS — Z881 Allergy status to other antibiotic agents status: Secondary | ICD-10-CM

## 2021-06-07 DIAGNOSIS — G40909 Epilepsy, unspecified, not intractable, without status epilepticus: Secondary | ICD-10-CM | POA: Diagnosis present

## 2021-06-07 DIAGNOSIS — I10 Essential (primary) hypertension: Secondary | ICD-10-CM | POA: Diagnosis not present

## 2021-06-07 DIAGNOSIS — I350 Nonrheumatic aortic (valve) stenosis: Secondary | ICD-10-CM | POA: Diagnosis present

## 2021-06-07 DIAGNOSIS — I5031 Acute diastolic (congestive) heart failure: Secondary | ICD-10-CM | POA: Diagnosis not present

## 2021-06-07 DIAGNOSIS — Z8249 Family history of ischemic heart disease and other diseases of the circulatory system: Secondary | ICD-10-CM

## 2021-06-07 DIAGNOSIS — Z888 Allergy status to other drugs, medicaments and biological substances status: Secondary | ICD-10-CM

## 2021-06-07 DIAGNOSIS — I499 Cardiac arrhythmia, unspecified: Secondary | ICD-10-CM | POA: Diagnosis not present

## 2021-06-07 DIAGNOSIS — L03119 Cellulitis of unspecified part of limb: Secondary | ICD-10-CM

## 2021-06-07 DIAGNOSIS — R079 Chest pain, unspecified: Secondary | ICD-10-CM

## 2021-06-07 DIAGNOSIS — L03115 Cellulitis of right lower limb: Secondary | ICD-10-CM | POA: Diagnosis present

## 2021-06-07 DIAGNOSIS — Z833 Family history of diabetes mellitus: Secondary | ICD-10-CM

## 2021-06-07 DIAGNOSIS — I480 Paroxysmal atrial fibrillation: Secondary | ICD-10-CM | POA: Diagnosis not present

## 2021-06-07 DIAGNOSIS — R0789 Other chest pain: Secondary | ICD-10-CM | POA: Diagnosis not present

## 2021-06-07 DIAGNOSIS — Z743 Need for continuous supervision: Secondary | ICD-10-CM | POA: Diagnosis not present

## 2021-06-07 DIAGNOSIS — Z7901 Long term (current) use of anticoagulants: Secondary | ICD-10-CM | POA: Diagnosis not present

## 2021-06-07 DIAGNOSIS — Z86010 Personal history of colonic polyps: Secondary | ICD-10-CM

## 2021-06-07 DIAGNOSIS — M19012 Primary osteoarthritis, left shoulder: Secondary | ICD-10-CM | POA: Diagnosis present

## 2021-06-07 LAB — BRAIN NATRIURETIC PEPTIDE: B Natriuretic Peptide: 41.8 pg/mL (ref 0.0–100.0)

## 2021-06-07 LAB — CBC
HCT: 44.3 % (ref 39.0–52.0)
Hemoglobin: 13.8 g/dL (ref 13.0–17.0)
MCH: 29 pg (ref 26.0–34.0)
MCHC: 31.2 g/dL (ref 30.0–36.0)
MCV: 93.1 fL (ref 80.0–100.0)
Platelets: 295 10*3/uL (ref 150–400)
RBC: 4.76 MIL/uL (ref 4.22–5.81)
RDW: 14.8 % (ref 11.5–15.5)
WBC: 11 10*3/uL — ABNORMAL HIGH (ref 4.0–10.5)
nRBC: 0 % (ref 0.0–0.2)

## 2021-06-07 LAB — TROPONIN I (HIGH SENSITIVITY)
Troponin I (High Sensitivity): 5 ng/L (ref <18)
Troponin I (High Sensitivity): 5 ng/L (ref <18)

## 2021-06-07 LAB — BASIC METABOLIC PANEL
Anion gap: 13 (ref 5–15)
BUN: 18 mg/dL (ref 8–23)
CO2: 28 mmol/L (ref 22–32)
Calcium: 9.5 mg/dL (ref 8.9–10.3)
Chloride: 97 mmol/L — ABNORMAL LOW (ref 98–111)
Creatinine, Ser: 0.88 mg/dL (ref 0.61–1.24)
GFR, Estimated: >60 mL/min (ref >60)
Glucose, Bld: 113 mg/dL — ABNORMAL HIGH (ref 70–99)
Potassium: 3.6 mmol/L (ref 3.5–5.1)
Sodium: 138 mmol/L (ref 135–145)

## 2021-06-07 MED ORDER — PREGABALIN 100 MG PO CAPS
100.0000 mg | ORAL_CAPSULE | Freq: Once | ORAL | Status: AC
  Administered 2021-06-07: 100 mg via ORAL
  Filled 2021-06-07: qty 1

## 2021-06-07 MED ORDER — OXYCODONE HCL 5 MG PO TABS
10.0000 mg | ORAL_TABLET | Freq: Once | ORAL | Status: AC
  Administered 2021-06-07: 10 mg via ORAL
  Filled 2021-06-07: qty 2

## 2021-06-07 MED ORDER — FUROSEMIDE 10 MG/ML IJ SOLN
40.0000 mg | Freq: Once | INTRAMUSCULAR | Status: AC
  Administered 2021-06-07: 40 mg via INTRAVENOUS
  Filled 2021-06-07: qty 4

## 2021-06-07 NOTE — ED Notes (Signed)
Iv team at bedside  

## 2021-06-07 NOTE — Telephone Encounter (Signed)
   Pt is returning call, pt said he is sitting at [facility] hospital in the ED right now.

## 2021-06-07 NOTE — Telephone Encounter (Signed)
Left message for patient to call back  

## 2021-06-07 NOTE — ED Provider Notes (Signed)
[facility] EMERGENCY DEPARTMENT Provider Note   CSN: [id number] Arrival date & time: 06/07/21  1505     History Chief Complaint  Patient presents with   Chest Pain   Shortness of Breath    Lawrence [name] is a 65 y.o. male with a history of insulin-dependent diabetes, acute respiratory failure with hypoxia, chronic diastolic heart failure with anasarca, accessible atrial fibrillation, COPD presents to the emerge department with complaints of chest pain ongoing since 11:30 AM.  Patient reports that yesterday he had an episode of A. fib but this seemed to resolve.  He reports his chest pain is been persistent, is left-sided and described as "poking."  States that he has had leg swelling, redness and weeping for the last 5 days.  Patient does not wear oxygen at home but did require oxygen upon EMS arrival as he was hypoxic in the 80s.  Patient reports approximately 10 pound weight gain in the last several days.  He does take Eliquis but has not had his dose today.  He reports associated dyspnea on exertion and increased chest pain with exertion.  Reports his chest pain does improve with rest.  Increased improvement after EMS gave aspirin and nitroglycerin.  Reports he was last hospitalized 3 to 4 months ago for similar symptoms.  Patient sees Dr. Radford Pax as his cardiologist.  PCP is Sadie Haber.   The history is provided by the patient, medical records and the spouse. No language interpreter was used.      Past Medical History:  Diagnosis Date   Acquired dilation of ascending aorta and aortic root ([place])    33mm by echo 01/2021   Adenomatous colon polyp 2007   Anemia    Anxiety    Aortic stenosis    moderate AS by echo 01/2021   Asthma    BPH without obstruction/lower urinary tract symptoms 02/22/2017   Chronic diastolic (congestive) heart failure (HCC)    Chronic venous stasis 03/07/2019   COPD (chronic obstructive pulmonary disease) (HCC)    Coronary artery calcification  seen on CAT scan    Depression    Diabetic neuropathy ([place]) 09/11/2019   History of colon polyps 08/24/2018   Hypertension    Morbid obesity (HCC)    OSA (obstructive sleep apnea)    Pain due to onychomycosis of toenails of both feet 09/11/2019   Peripheral neuropathy 02/22/2017   Primary osteoarthritis, left shoulder 03/05/2017   PTSD (post-traumatic stress disorder)    Pure hypercholesterolemia    QT prolongation 03/07/2019   Seizures (HCC)    Shortness of breath    Sinus tachycardia 03/07/2019   Sleep apnea    CPAP   Type 2 diabetes mellitus with vascular disease ([place]) 09/11/2019    Patient Active Problem List   Diagnosis Date Noted   Stroke-like symptoms 04/27/2021   Ambulatory dysfunction 04/27/2021   Left-sided weakness 02/09/2021   Atrial fibrillation with rapid ventricular response ([place]) 02/08/2021   Acute left-sided weakness 01/25/2021   AF (paroxysmal atrial fibrillation) ([place]) 01/25/2021   COPD (chronic obstructive pulmonary disease) ([place]) 01/25/2021   Moderate aortic stenosis 01/21/2021   Acquired dilation of ascending aorta and aortic root ([place]) 01/21/2021   Coronary artery disease due to lipid rich plaque    SVT (supraventricular tachycardia) (HCC)    Partial seizure ([place])    Acute CHF (congestive heart failure) ([place]) 10/17/2020   Essential hypertension 10/17/2020   Hypokalemia 10/17/2020   Cellulitis of right leg 12/04/2019   Pain due to  onychomycosis of toenails of both feet 09/11/2019   Diabetic neuropathy ([place]) 09/11/2019   Type 2 diabetes mellitus with diabetic polyneuropathy, with long-term current use of insulin ([place]) 09/11/2019   Sinus tachycardia 03/07/2019   Chronic venous stasis 03/07/2019   History of anemia 12/26/2018   Anemia 08/24/2018   History of colon polyps 08/24/2018   Do not resuscitate status 08/24/2018   Chronic diastolic CHF (congestive heart failure) ([place]) 08/18/2018   Bilateral lower leg cellulitis    Coronary artery  calcification seen on CAT scan    Pure hypercholesterolemia    Chest pain    Shortness of breath    Acute respiratory failure with hypoxia ([place]) 01/13/2018   Right foot pain 01/13/2018   Acute on chronic diastolic CHF (congestive heart failure) ([place]) 01/13/2018   Diabetes mellitus type 2 in obese ([place]) 01/13/2018   New onset type 2 diabetes mellitus ([place]) 11/08/2017   Cellulitis 11/07/2017   Primary osteoarthritis, left shoulder 03/05/2017   Essential hypertension, benign 02/22/2017   BPH without obstruction/lower urinary tract symptoms 02/22/2017   Hematuria 02/22/2017   Peripheral neuropathy 02/22/2017   Physical deconditioning 02/22/2017   Morbid obesity ([place])    PTSD (post-traumatic stress disorder)    Anasarca 01/31/2017   Bilateral lower extremity edema 01/31/2017   OSA (obstructive sleep apnea)     Past Surgical History:  Procedure Laterality Date   ENDOVENOUS ABLATION SAPHENOUS VEIN W/ LASER Right 08/20/2020   endovenous laser ablation right greater saphenous vein by Gae Gallop MD    JOINT REPLACEMENT     left knee replacement x 2   KNEE ARTHROSCOPY Bilateral    LEFT HEART CATH AND CORONARY ANGIOGRAPHY N/A 01/17/2018   Procedure: LEFT HEART CATH AND CORONARY ANGIOGRAPHY;  Surgeon: Leonie Man, MD;  Location: [facility];  Service: Cardiovascular;  Laterality: N/A;   UMBILICAL HERNIA REPAIR         Family History  Problem Relation Age of Onset   CAD Maternal Grandfather    Diabetes Other    Diabetes Mellitus II Neg Hx    Colon cancer Neg Hx    Esophageal cancer Neg Hx    Inflammatory bowel disease Neg Hx    Liver disease Neg Hx    Pancreatic cancer Neg Hx    Rectal cancer Neg Hx    Stomach cancer Neg Hx    Sleep apnea Neg Hx     Social History   Tobacco Use   Smoking status: Former    Types: Cigarettes    Quit date: 03/07/2016    Years since quitting: 5.2   Smokeless tobacco: Never  Vaping Use   Vaping Use: Never used  Substance Use  Topics   Alcohol use: Yes    Comment: rare   Drug use: No    Home Medications Prior to Admission medications   Medication Sig Start Date End Date Taking? Authorizing Provider  Accu-Chek FastClix Lancets MISC 1 each by Other route in the morning and at bedtime. 12/22/20  Yes [provider]  acetaminophen (TYLENOL) 325 MG tablet Take 2 tablets (650 mg total) by mouth every 4 (four) hours as needed for mild pain (or temp > 37.5 C (99.5 F)). 01/26/21  Yes Cherene Altes, MD  albuterol (VENTOLIN HFA) 108 (90 Base) MCG/ACT inhaler Inhale 2 puffs into the lungs every 6 (six) hours as needed for wheezing or shortness of breath. 03/10/20  Yes [provider]  Alcohol Swabs (ALCOHOL PREP) 70 % PADS  1 each by Other route in the morning, at noon, and at bedtime. 12/22/20  Yes [provider]  allopurinol (ZYLOPRIM) 100 MG tablet Take 200 mg by mouth daily.   Yes [provider]  apixaban (ELIQUIS) 5 MG TABS tablet Take 1 tablet (5 mg total) by mouth 2 (two) times daily. 01/07/21  Yes Turner, Traci R, MD  ARIPiprazole (ABILIFY) 5 MG tablet TAKE ONE TABLET BY MOUTH EVERYDAY AT BEDTIME Patient taking differently: Take 5 mg by mouth at bedtime. 04/20/21  Yes Mozingo, Berdie Ogren, NP  atorvastatin (LIPITOR) 40 MG tablet TAKE ONE TABLET BY MOUTH DAILY AT 6PM Patient taking differently: Take 40 mg by mouth every evening. TAKE ONE TABLET BY MOUTH DAILY AT 6PM 01/07/21  Yes Turner, Eber Hong, MD  B-D ULTRAFINE III SHORT PEN 31G X 8 MM MISC 1 each by Other route See admin instructions. Use to inject insulin subcutaneously at bedtime as directed. 11/20/20  Yes [provider]  blood glucose meter kit and supplies Dispense based on patient and insurance preference. Use up to four times daily as directed. (FOR ICD-10 E10.9, E11.9). 11/09/17  Yes Eugenie Filler, MD  busPIRone (BUSPAR) 5 MG tablet TAKE ONE TABLET BY MOUTH THREE TIMES DAILY Patient taking differently: Take 5 mg  by mouth 3 (three) times daily. 04/20/21  Yes Mozingo, Berdie Ogren, NP  cetirizine (ZYRTEC) 10 MG tablet Take 10 mg by mouth at bedtime.    Yes [provider]  dapagliflozin propanediol (FARXIGA) 10 MG TABS tablet Take 1 tablet (10 mg total) by mouth daily before breakfast. 01/05/21  Yes Renato Shin, MD  diltiazem (CARDIZEM CD) 180 MG 24 hr capsule TAKE ONE CAPSULE BY MOUTH ONCE DAILY Patient taking differently: Take 180 mg by mouth daily. 05/07/21  Yes Turner, Eber Hong, MD  Dulaglutide (TRULICITY) 4.5 BP/7.9KF SOPN Inject 4.5 mg as directed once a week. Patient taking differently: Inject 4.5 mg as directed every Monday. 11/19/20  Yes Renato Shin, MD  ferrous sulfate 324 (65 Fe) MG TBEC Take 324 mg by mouth daily with breakfast.    Yes [provider]  fluticasone (FLONASE) 50 MCG/ACT nasal spray Place 2 sprays into both nostrils daily.    Yes [provider]  insulin glargine (LANTUS SOLOSTAR) 100 UNIT/ML Solostar Pen Inject 5 Units into the skin every morning. 04/08/21  Yes Renato Shin, MD  isosorbide mononitrate (IMDUR) 30 MG 24 hr tablet Take 1 tablet (30 mg total) by mouth daily. 01/07/21  Yes Turner, Eber Hong, MD  metFORMIN (GLUCOPHAGE) 1000 MG tablet Take 1,000 mg by mouth 2 (two) times daily with a meal.   Yes [provider]  metoprolol succinate (TOPROL-XL) 50 MG 24 hr tablet Take 1 tablet (50 mg total) by mouth daily. Take with or immediately following a meal. 05/20/21 08/18/21 Yes Turner, Eber Hong, MD  omeprazole (PRILOSEC) 20 MG capsule Take 20 mg by mouth every morning. 05/29/20  Yes [provider]  Oxycodone HCl 10 MG TABS Take 10 mg by mouth every 8 (eight) hours. 05/05/21 07/04/21 Yes [provider]  potassium chloride SA (KLOR-CON) 20 MEQ tablet Take 2 tablets (40 mEq total) by mouth daily. 01/07/21  Yes Turner, Eber Hong, MD  prazosin (MINIPRESS) 2 MG capsule Take 2 capsules (4 mg total) by mouth at bedtime. 01/07/21  Yes Turner,  Eber Hong, MD  pregabalin (LYRICA) 100 MG capsule TAKE ONE CAPSULE BY MOUTH THREE TIMES DAILY Patient taking differently: Take 100 mg by mouth 3 (  three) times daily. Take along with 50 mg capsules 05/05/21  Yes Camara, Amadou, MD  pregabalin (LYRICA) 50 MG capsule Take 1 capsule (50 mg total) by mouth 3 (three) times daily. Patient taking differently: Take 50 mg by mouth 3 (three) times daily. Along with 100 mg capsules 04/22/21  Yes Camara, Amalia Hailey, MD  repaglinide (PRANDIN) 2 MG tablet Take 1 tablet (2 mg total) by mouth 2 (two) times daily before a meal. 04/20/21  Yes Romero Belling, MD  spironolactone (ALDACTONE) 25 MG tablet Take 1 tablet (25 mg total) by mouth daily. 01/07/21  Yes Turner, Cornelious Bryant, MD  temazepam (RESTORIL) 15 MG capsule TAKE ONE CAPSULE BY MOUTH EVERYDAY AT BEDTIME Patient taking differently: Take 15 mg by mouth at bedtime. 04/20/21  Yes Mozingo, Thereasa Solo, NP  torsemide (DEMADEX) 20 MG tablet Take 2 tablets (40 mg total) in the morning and 1 tablet (20 mg total) in the evening. Patient taking differently: Take 40 mg by mouth 2 (two) times daily. 01/07/21  Yes Turner, Cornelious Bryant, MD  traZODone (DESYREL) 100 MG tablet Take 2 tablets (200 mg total) by mouth at bedtime. 12/31/20  Yes Mozingo, Thereasa Solo, NP  venlafaxine XR (EFFEXOR-XR) 75 MG 24 hr capsule Take 3 capsules (225 mg total) by mouth daily with breakfast. 12/10/20  Yes Mozingo, Thereasa Solo, NP  vitamin B-12 1000 MCG tablet Take 1 tablet (1,000 mcg total) by mouth daily. 04/28/21  Yes Almon Hercules, MD  Cholecalciferol (VITAMIN D3 PO) Take 2,000 Units by mouth daily.    [provider]    Allergies    Vancomycin, Niacin, Niacin and related, Tubersol [tuberculin], and Doxycycline  Review of Systems   Review of Systems  Constitutional:  Positive for fatigue. Negative for appetite change, diaphoresis, fever and unexpected weight change.  HENT:  Negative for mouth sores.   Eyes:  Negative for visual disturbance.   Respiratory:  Positive for shortness of breath. Negative for cough, chest tightness and wheezing.   Cardiovascular:  Positive for chest pain, palpitations and leg swelling.  Gastrointestinal:  Negative for abdominal pain, constipation, diarrhea, nausea and vomiting.  Endocrine: Negative for polydipsia, polyphagia and polyuria.  Genitourinary:  Negative for dysuria, frequency, hematuria and urgency.  Musculoskeletal:  Negative for back pain and neck stiffness.  Skin:  Negative for rash.  Allergic/Immunologic: Negative for immunocompromised state.  Neurological:  Negative for syncope, light-headedness and headaches.  Hematological:  Does not bruise/bleed easily.  Psychiatric/Behavioral:  Negative for sleep disturbance. The patient is not nervous/anxious.    Physical Exam Updated Vital Signs BP 121/69   Pulse 90   Temp 98.1 F (36.7 C) (Oral)   Resp 17   Wt (!) 167.4 kg   SpO2 98%   BMI 47.38 kg/m   Physical Exam Vitals and nursing note reviewed.  Constitutional:      General: He is not in acute distress.    Appearance: He is not diaphoretic.     Comments: Restless in bed  HENT:     Head: Normocephalic.  Eyes:     General: No scleral icterus.    Conjunctiva/sclera: Conjunctivae normal.  Cardiovascular:     Rate and Rhythm: Normal rate and regular rhythm.     Pulses: Normal pulses.          Radial pulses are 2+ on the right side and 2+ on the left side.  Pulmonary:     Effort: No tachypnea, accessory muscle usage, prolonged expiration, respiratory distress or retractions.  Breath sounds: No stridor. Examination of the right-middle field reveals rales. Examination of the left-middle field reveals rales. Examination of the right-lower field reveals rales. Examination of the left-lower field reveals rales. Rales present.     Comments: Equal chest rise. No increased work of breathing. Abdominal:     General: There is no distension.     Palpations: Abdomen is soft.      Tenderness: There is no abdominal tenderness. There is no guarding or rebound.  Musculoskeletal:     Cervical back: Normal range of motion.     Right lower leg: 3+ Edema present.     Left lower leg: 3+ Edema present.     Comments: Moves all extremities equally and without difficulty.  Skin:    General: Skin is warm and dry.     Capillary Refill: Capillary refill takes less than 2 seconds.     Comments: Venous skin changes to the BLE  Neurological:     Mental Status: He is alert.     GCS: GCS eye subscore is 4. GCS verbal subscore is 5. GCS motor subscore is 6.     Comments: Speech is clear and goal oriented.  Psychiatric:        Mood and Affect: Mood normal.    ED Results / Procedures / Treatments   Labs (all labs ordered are listed, but only abnormal results are displayed) Labs Reviewed  BASIC METABOLIC PANEL - Abnormal; Notable for the following components:      Result Value   Chloride 97 (*)    Glucose, Bld 113 (*)    All other components within normal limits  CBC - Abnormal; Notable for the following components:   WBC 11.0 (*)    All other components within normal limits  BRAIN NATRIURETIC PEPTIDE  TROPONIN I (HIGH SENSITIVITY)  TROPONIN I (HIGH SENSITIVITY)    EKG EKG Interpretation  Date/Time:  Monday June 07 2021 15:12:01 EDT Ventricular Rate:  80 PR Interval:  234 QRS Duration: 104 QT Interval:  410 QTC Calculation: 472 R Axis:   -16 Text Interpretation: Sinus rhythm with marked sinus arrhythmia with 1st degree A-V block Low voltage QRS Cannot rule out Anterior infarct , age undetermined Abnormal ECG Confirmed by Madalyn Rob [phone]) on 06/07/2021 11:18:28 PM  Radiology DG Chest 2 View  Result Date: 06/07/2021 CLINICAL DATA:  Chest pain and shortness of breath for several days, CHF, COPD, coronary artery disease, diabetes mellitus, former smoker EXAM: CHEST - 2 VIEW COMPARISON:  04/27/2021 FINDINGS: Upper normal heart size. Mediastinal contours and  pulmonary vascularity normal. Bronchitic changes with bibasilar atelectasis. Lungs otherwise clear. No pulmonary infiltrate, pleural effusion, or pneumothorax. Scattered endplate spur formation thoracic spine. IMPRESSION: Bronchitic changes with bibasilar atelectasis. Electronically Signed   By: Lavonia Dana M.D.   On: 06/07/2021 15:45    Procedures Procedures   Medications Ordered in ED Medications  pregabalin (LYRICA) capsule 100 mg (100 mg Oral Given 06/07/21 2345)  furosemide (LASIX) injection 40 mg (40 mg Intravenous Given 06/07/21 2347)  oxyCODONE (Oxy IR/ROXICODONE) immediate release tablet 10 mg (10 mg Oral Given 06/07/21 2345)    ED Course  I have reviewed the triage vital signs and the nursing notes.  Pertinent labs & imaging results that were available during my care of the patient were reviewed by me and considered in my medical decision making (see chart for details).    MDM Rules/Calculators/A&P  Patient with what appears to be a CHF exacerbation.  He takes torsemide however has not had it today.  Will give Lasix and check additional blood work.  We will plan for admission.  12:24 AM Labs overall reassuring.  Slightly elevated BNP from baseline but not significantly elevated.  Clinically fluid overloaded.  Initial and repeat troponin negative.  Patient given Lasix.  Peripheral extremities erythematous.  This is new.  They are not warm.  Does have a slightly elevated white blood cell count.  Afebrile.  Early cellulitis is not excluded.  Patient will be admitted.  The patient was discussed with and evaluated by Dr. Dina Rich who agrees with the treatment plan.    Final Clinical Impression(s) / ED Diagnoses Final diagnoses:  Acute on chronic congestive heart failure, unspecified heart failure type ([place])  Peripheral edema  Chest pain, unspecified type    Rx / DC Orders ED Discharge Orders     None        Scheryl Sanborn, Gwenlyn Perking 06/08/21 2204    Merryl Hacker, MD 06/14/21 [phone]

## 2021-06-07 NOTE — ED Triage Notes (Signed)
Patient presents to ed via [facility] states he has been sob and chest pain x 2 days , c/o increased pedal edema  x several weeks, lungs clear bilateral . EMS states patient was sat at 80% FD placed him on NRB increased sats , ems changed to [place] 4 Liters sat 95%

## 2021-06-08 ENCOUNTER — Inpatient Hospital Stay (HOSPITAL_COMMUNITY): Payer: No Typology Code available for payment source

## 2021-06-08 ENCOUNTER — Encounter: Payer: Self-pay | Admitting: Neurology

## 2021-06-08 ENCOUNTER — Encounter (HOSPITAL_COMMUNITY): Payer: Self-pay | Admitting: Internal Medicine

## 2021-06-08 ENCOUNTER — Other Ambulatory Visit: Payer: Self-pay

## 2021-06-08 DIAGNOSIS — Z66 Do not resuscitate: Secondary | ICD-10-CM | POA: Diagnosis present

## 2021-06-08 DIAGNOSIS — K219 Gastro-esophageal reflux disease without esophagitis: Secondary | ICD-10-CM | POA: Diagnosis present

## 2021-06-08 DIAGNOSIS — J449 Chronic obstructive pulmonary disease, unspecified: Secondary | ICD-10-CM | POA: Diagnosis not present

## 2021-06-08 DIAGNOSIS — G4733 Obstructive sleep apnea (adult) (pediatric): Secondary | ICD-10-CM | POA: Diagnosis not present

## 2021-06-08 DIAGNOSIS — E1142 Type 2 diabetes mellitus with diabetic polyneuropathy: Secondary | ICD-10-CM | POA: Diagnosis present

## 2021-06-08 DIAGNOSIS — I878 Other specified disorders of veins: Secondary | ICD-10-CM | POA: Diagnosis present

## 2021-06-08 DIAGNOSIS — F4310 Post-traumatic stress disorder, unspecified: Secondary | ICD-10-CM | POA: Diagnosis present

## 2021-06-08 DIAGNOSIS — R0902 Hypoxemia: Secondary | ICD-10-CM | POA: Diagnosis not present

## 2021-06-08 DIAGNOSIS — I10 Essential (primary) hypertension: Secondary | ICD-10-CM | POA: Diagnosis not present

## 2021-06-08 DIAGNOSIS — Z7901 Long term (current) use of anticoagulants: Secondary | ICD-10-CM | POA: Diagnosis not present

## 2021-06-08 DIAGNOSIS — Z6841 Body Mass Index (BMI) 40.0 and over, adult: Secondary | ICD-10-CM | POA: Diagnosis not present

## 2021-06-08 DIAGNOSIS — G40909 Epilepsy, unspecified, not intractable, without status epilepticus: Secondary | ICD-10-CM | POA: Diagnosis present

## 2021-06-08 DIAGNOSIS — E7800 Pure hypercholesterolemia, unspecified: Secondary | ICD-10-CM | POA: Diagnosis present

## 2021-06-08 DIAGNOSIS — I350 Nonrheumatic aortic (valve) stenosis: Secondary | ICD-10-CM | POA: Diagnosis present

## 2021-06-08 DIAGNOSIS — G894 Chronic pain syndrome: Secondary | ICD-10-CM | POA: Diagnosis present

## 2021-06-08 DIAGNOSIS — J9811 Atelectasis: Secondary | ICD-10-CM | POA: Diagnosis present

## 2021-06-08 DIAGNOSIS — F39 Unspecified mood [affective] disorder: Secondary | ICD-10-CM | POA: Diagnosis present

## 2021-06-08 DIAGNOSIS — I5033 Acute on chronic diastolic (congestive) heart failure: Secondary | ICD-10-CM | POA: Diagnosis not present

## 2021-06-08 DIAGNOSIS — I110 Hypertensive heart disease with heart failure: Secondary | ICD-10-CM | POA: Diagnosis present

## 2021-06-08 DIAGNOSIS — I480 Paroxysmal atrial fibrillation: Secondary | ICD-10-CM | POA: Diagnosis present

## 2021-06-08 DIAGNOSIS — I509 Heart failure, unspecified: Secondary | ICD-10-CM

## 2021-06-08 DIAGNOSIS — L03116 Cellulitis of left lower limb: Secondary | ICD-10-CM | POA: Diagnosis present

## 2021-06-08 DIAGNOSIS — L03119 Cellulitis of unspecified part of limb: Secondary | ICD-10-CM | POA: Diagnosis not present

## 2021-06-08 DIAGNOSIS — M109 Gout, unspecified: Secondary | ICD-10-CM | POA: Diagnosis present

## 2021-06-08 DIAGNOSIS — I2510 Atherosclerotic heart disease of native coronary artery without angina pectoris: Secondary | ICD-10-CM | POA: Diagnosis present

## 2021-06-08 DIAGNOSIS — L03115 Cellulitis of right lower limb: Secondary | ICD-10-CM | POA: Diagnosis present

## 2021-06-08 DIAGNOSIS — Z20822 Contact with and (suspected) exposure to covid-19: Secondary | ICD-10-CM | POA: Diagnosis present

## 2021-06-08 DIAGNOSIS — Z743 Need for continuous supervision: Secondary | ICD-10-CM | POA: Diagnosis not present

## 2021-06-08 DIAGNOSIS — I5031 Acute diastolic (congestive) heart failure: Secondary | ICD-10-CM | POA: Diagnosis not present

## 2021-06-08 LAB — CBC
HCT: 41.9 % (ref 39.0–52.0)
Hemoglobin: 13.6 g/dL (ref 13.0–17.0)
MCH: 29.4 pg (ref 26.0–34.0)
MCHC: 32.5 g/dL (ref 30.0–36.0)
MCV: 90.7 fL (ref 80.0–100.0)
Platelets: 264 10*3/uL (ref 150–400)
RBC: 4.62 MIL/uL (ref 4.22–5.81)
RDW: 15 % (ref 11.5–15.5)
WBC: 10.6 10*3/uL — ABNORMAL HIGH (ref 4.0–10.5)
nRBC: 0 % (ref 0.0–0.2)

## 2021-06-08 LAB — ECHOCARDIOGRAM COMPLETE
AV Mean grad: 20.2 mmHg
AV Peak grad: 41.1 mmHg
Ao pk vel: 3.2 m/s
Area-P 1/2: 3.68 cm2
Height: 74 in
S' Lateral: 3.7 cm
Weight: 5904.8 oz

## 2021-06-08 LAB — RESP PANEL BY RT-PCR (FLU A&B, COVID) ARPGX2
Influenza A by PCR: NEGATIVE
Influenza B by PCR: NEGATIVE
SARS Coronavirus 2 by RT PCR: NEGATIVE

## 2021-06-08 LAB — GLUCOSE, CAPILLARY
Glucose-Capillary: 138 mg/dL — ABNORMAL HIGH (ref 70–99)
Glucose-Capillary: 117 mg/dL — ABNORMAL HIGH (ref 70–99)
Glucose-Capillary: 135 mg/dL — ABNORMAL HIGH (ref 70–99)

## 2021-06-08 LAB — CBG MONITORING, ED
Glucose-Capillary: 172 mg/dL — ABNORMAL HIGH (ref 70–99)
Glucose-Capillary: 115 mg/dL — ABNORMAL HIGH (ref 70–99)
Glucose-Capillary: 124 mg/dL — ABNORMAL HIGH (ref 70–99)

## 2021-06-08 LAB — D-DIMER, QUANTITATIVE: D-Dimer, Quant: 0.38 ug/mL-FEU (ref 0.00–0.50)

## 2021-06-08 MED ORDER — ACETAMINOPHEN 325 MG PO TABS
650.0000 mg | ORAL_TABLET | Freq: Four times a day (QID) | ORAL | Status: DC | PRN
  Administered 2021-06-08 – 2021-06-10 (×3): 650 mg via ORAL
  Filled 2021-06-08 (×3): qty 2

## 2021-06-08 MED ORDER — ARIPIPRAZOLE 5 MG PO TABS
5.0000 mg | ORAL_TABLET | Freq: Every day | ORAL | Status: DC
  Administered 2021-06-08 – 2021-06-10 (×4): 5 mg via ORAL
  Filled 2021-06-08 (×5): qty 1

## 2021-06-08 MED ORDER — MORPHINE SULFATE (PF) 2 MG/ML IV SOLN
2.0000 mg | INTRAVENOUS | Status: DC | PRN
  Administered 2021-06-08 (×2): 4 mg via INTRAVENOUS
  Administered 2021-06-09 (×2): 2 mg via INTRAVENOUS
  Filled 2021-06-08 (×2): qty 1
  Filled 2021-06-08: qty 2
  Filled 2021-06-08: qty 1
  Filled 2021-06-08: qty 2

## 2021-06-08 MED ORDER — PRAZOSIN HCL 2 MG PO CAPS
4.0000 mg | ORAL_CAPSULE | Freq: Every day | ORAL | Status: DC
  Administered 2021-06-08 – 2021-06-10 (×4): 4 mg via ORAL
  Filled 2021-06-08 (×5): qty 2

## 2021-06-08 MED ORDER — CEFTRIAXONE SODIUM 2 G IJ SOLR
2.0000 g | INTRAMUSCULAR | Status: DC
  Administered 2021-06-08: 2 g via INTRAVENOUS
  Filled 2021-06-08: qty 20

## 2021-06-08 MED ORDER — INSULIN GLARGINE 100 UNIT/ML ~~LOC~~ SOLN
5.0000 [IU] | Freq: Every day | SUBCUTANEOUS | Status: DC
  Administered 2021-06-08: 5 [IU] via SUBCUTANEOUS
  Filled 2021-06-08 (×3): qty 0.05

## 2021-06-08 MED ORDER — TEMAZEPAM 15 MG PO CAPS
15.0000 mg | ORAL_CAPSULE | Freq: Every day | ORAL | Status: DC
  Administered 2021-06-08 – 2021-06-10 (×3): 15 mg via ORAL
  Filled 2021-06-08 (×3): qty 1

## 2021-06-08 MED ORDER — BUSPIRONE HCL 5 MG PO TABS
5.0000 mg | ORAL_TABLET | Freq: Three times a day (TID) | ORAL | Status: DC
  Administered 2021-06-08 – 2021-06-10 (×9): 5 mg via ORAL
  Filled 2021-06-08 (×10): qty 1

## 2021-06-08 MED ORDER — DAPAGLIFLOZIN PROPANEDIOL 10 MG PO TABS
10.0000 mg | ORAL_TABLET | Freq: Every day | ORAL | Status: DC
  Administered 2021-06-08 – 2021-06-10 (×3): 10 mg via ORAL
  Filled 2021-06-08 (×4): qty 1

## 2021-06-08 MED ORDER — OXYCODONE HCL 5 MG PO TABS
10.0000 mg | ORAL_TABLET | Freq: Three times a day (TID) | ORAL | Status: DC | PRN
  Administered 2021-06-08 – 2021-06-10 (×5): 10 mg via ORAL
  Filled 2021-06-08 (×5): qty 2

## 2021-06-08 MED ORDER — ALLOPURINOL 100 MG PO TABS
200.0000 mg | ORAL_TABLET | Freq: Every day | ORAL | Status: DC
  Administered 2021-06-08 – 2021-06-10 (×3): 200 mg via ORAL
  Filled 2021-06-08 (×3): qty 2

## 2021-06-08 MED ORDER — SODIUM CHLORIDE 0.9 % IV SOLN
2.0000 g | Freq: Once | INTRAVENOUS | Status: AC
  Administered 2021-06-08: 2 g via INTRAVENOUS
  Filled 2021-06-08: qty 20

## 2021-06-08 MED ORDER — FUROSEMIDE 10 MG/ML IJ SOLN
40.0000 mg | Freq: Two times a day (BID) | INTRAMUSCULAR | Status: DC
  Administered 2021-06-08 – 2021-06-09 (×3): 40 mg via INTRAVENOUS
  Filled 2021-06-08 (×3): qty 4

## 2021-06-08 MED ORDER — PANTOPRAZOLE SODIUM 40 MG PO TBEC
40.0000 mg | DELAYED_RELEASE_TABLET | Freq: Every day | ORAL | Status: DC
  Administered 2021-06-08 – 2021-06-10 (×3): 40 mg via ORAL
  Filled 2021-06-08 (×3): qty 1

## 2021-06-08 MED ORDER — ACETAMINOPHEN 650 MG RE SUPP: 650.0000 mg | Freq: Four times a day (QID) | RECTAL | Status: DC | PRN

## 2021-06-08 MED ORDER — TRAZODONE HCL 100 MG PO TABS
200.0000 mg | ORAL_TABLET | Freq: Every day | ORAL | Status: DC
  Administered 2021-06-08 – 2021-06-10 (×3): 200 mg via ORAL
  Filled 2021-06-08 (×3): qty 2

## 2021-06-08 MED ORDER — PREGABALIN 100 MG PO CAPS: 100.0000 mg | ORAL_CAPSULE | Freq: Three times a day (TID) | ORAL | Status: DC

## 2021-06-08 MED ORDER — ATORVASTATIN CALCIUM 40 MG PO TABS
40.0000 mg | ORAL_TABLET | Freq: Every evening | ORAL | Status: DC
  Administered 2021-06-08 – 2021-06-10 (×3): 40 mg via ORAL
  Filled 2021-06-08 (×3): qty 1

## 2021-06-08 MED ORDER — IPRATROPIUM-ALBUTEROL 0.5-2.5 (3) MG/3ML IN SOLN: 3.0000 mL | Freq: Four times a day (QID) | RESPIRATORY_TRACT | Status: DC | PRN

## 2021-06-08 MED ORDER — PREGABALIN 25 MG PO CAPS: 50.0000 mg | ORAL_CAPSULE | Freq: Three times a day (TID) | ORAL | Status: DC

## 2021-06-08 MED ORDER — PREGABALIN 75 MG PO CAPS
150.0000 mg | ORAL_CAPSULE | Freq: Three times a day (TID) | ORAL | Status: DC
  Administered 2021-06-08 – 2021-06-10 (×9): 150 mg via ORAL
  Filled 2021-06-08 (×9): qty 2

## 2021-06-08 MED ORDER — INSULIN ASPART 100 UNIT/ML IJ SOLN: 0.0000 [IU] | Freq: Every day | INTRAMUSCULAR | Status: DC

## 2021-06-08 MED ORDER — VENLAFAXINE HCL ER 75 MG PO CP24
225.0000 mg | ORAL_CAPSULE | Freq: Every day | ORAL | Status: DC
  Administered 2021-06-08 – 2021-06-10 (×3): 225 mg via ORAL
  Filled 2021-06-08 (×2): qty 3
  Filled 2021-06-08: qty 1

## 2021-06-08 MED ORDER — APIXABAN 5 MG PO TABS
5.0000 mg | ORAL_TABLET | Freq: Two times a day (BID) | ORAL | Status: DC
  Administered 2021-06-08 – 2021-06-10 (×7): 5 mg via ORAL
  Filled 2021-06-08 (×7): qty 1

## 2021-06-08 MED ORDER — DILTIAZEM HCL ER COATED BEADS 180 MG PO CP24
180.0000 mg | ORAL_CAPSULE | Freq: Every day | ORAL | Status: DC
  Administered 2021-06-08 – 2021-06-10 (×3): 180 mg via ORAL
  Filled 2021-06-08 (×3): qty 1

## 2021-06-08 MED ORDER — INSULIN ASPART 100 UNIT/ML IJ SOLN
0.0000 [IU] | Freq: Three times a day (TID) | INTRAMUSCULAR | Status: DC
  Administered 2021-06-08 – 2021-06-09 (×3): 1 [IU] via SUBCUTANEOUS

## 2021-06-08 MED ORDER — OXYCODONE HCL 5 MG PO TABS
5.0000 mg | ORAL_TABLET | Freq: Once | ORAL | Status: AC
  Administered 2021-06-08: 5 mg via ORAL
  Filled 2021-06-08: qty 1

## 2021-06-08 MED ORDER — ISOSORBIDE MONONITRATE ER 30 MG PO TB24
30.0000 mg | ORAL_TABLET | Freq: Every day | ORAL | Status: DC
  Administered 2021-06-08 – 2021-06-10 (×3): 30 mg via ORAL
  Filled 2021-06-08 (×3): qty 1

## 2021-06-08 MED ORDER — OXYCODONE HCL 5 MG PO TABS: 10.0000 mg | ORAL_TABLET | Freq: Three times a day (TID) | ORAL | Status: DC

## 2021-06-08 MED ORDER — METOPROLOL SUCCINATE ER 50 MG PO TB24
50.0000 mg | ORAL_TABLET | Freq: Every day | ORAL | Status: DC
  Administered 2021-06-08 – 2021-06-10 (×3): 50 mg via ORAL
  Filled 2021-06-08 (×2): qty 1
  Filled 2021-06-08: qty 2

## 2021-06-08 NOTE — ED Notes (Signed)
Pt reporting no relief from the oxycodone - pt reporting 10/10 pain in his legs and requesting something else for pain - MD Alcario Drought made aware

## 2021-06-08 NOTE — Progress Notes (Signed)
Heart Failure Nurse Navigator Progress Note  PCP: [name], PA-C PCP-Cardiologist: Ashok Norris., MD Admission Diagnosis: CHF exacerbbation Admitted from: home with significant other  Presentation:   Lawrence [name] presented 11/1 with increased SOB and BLE edema. Pt resting on stretcher with HOB elevated. Pt minimally SOB at this time, on room air. Pt has very large distended abd, states feels tight.  Pt utilizes motorized scooter as main mode of ambulation. Pt able to walk from kitchen to bathroom to bed room, but if going out of the house pt uses scooter. Informed of [facility] transportation services, pt requesting enrollment. Navigator completed enrollment.   Pending ECHO. Will assess for HV TOC readiness upon admission to hospital room.  ECHO/ LVEF: 01/2021 60-65%, G1DD, mod AS.  Pending today.  Clinical Course:  Past Medical History:  Diagnosis Date   Acquired dilation of ascending aorta and aortic root ([place])    88mm by echo 01/2021   Adenomatous colon polyp 2007   Anemia    Anxiety    Aortic stenosis    moderate AS by echo 01/2021   Asthma    BPH without obstruction/lower urinary tract symptoms 02/22/2017   Chronic diastolic (congestive) heart failure (HCC)    Chronic venous stasis 03/07/2019   COPD (chronic obstructive pulmonary disease) (HCC)    Coronary artery calcification seen on CAT scan    Depression    Diabetic neuropathy ([place]) 09/11/2019   History of colon polyps 08/24/2018   Hypertension    Morbid obesity (HCC)    OSA (obstructive sleep apnea)    Pain due to onychomycosis of toenails of both feet 09/11/2019   Peripheral neuropathy 02/22/2017   Primary osteoarthritis, left shoulder 03/05/2017   PTSD (post-traumatic stress disorder)    Pure hypercholesterolemia    QT prolongation 03/07/2019   Seizures (HCC)    Shortness of breath    Sinus tachycardia 03/07/2019   Sleep apnea    CPAP   Type 2 diabetes mellitus with vascular disease ([place]) 09/11/2019      Social History   Socioeconomic History   Marital status: Significant Other    Spouse name: Lawrence [name]   Number of children: 1   Years of education: Not on file   Highest education level: Associate degree: academic program  Occupational History   Occupation: retired    Comment: Artist  Tobacco Use   Smoking status: Former    Packs/day: 1.00    Years: 44.00    Pack years: 44.00    Types: Cigarettes    Quit date: 03/07/2016    Years since quitting: 5.2    Passive exposure: Past   Smokeless tobacco: Never  Vaping Use   Vaping Use: Never used  Substance and Sexual Activity   Alcohol use: Yes    Comment: rare   Drug use: No   Sexual activity: Not on file  Other Topics Concern   Not on file  Social History Narrative   Lives with wife [name]   Right Handed   Drinks 1-2 cups caffeine   Social Determinants of Health   Financial Resource Strain: Medium Risk   Difficulty of Paying Living Expenses: Somewhat hard  Food Insecurity: Food Insecurity Present   Worried About Charity fundraiser in the Last Year: Sometimes true   Arboriculturist in the Last Year: Never true  Transportation Needs: Unmet Transportation Needs   Lack of Transportation (Medical): Yes   Lack of Transportation (Non-Medical): No  Physical Activity: Not on  file  Stress: Not on file  Social Connections: Not on file    High Risk Criteria for Readmission and/or Poor Patient Outcomes: Heart failure hospital admissions (last 6 months): 1  No Show rate: 0% Difficult social situation: no Demonstrates medication adherence: yes Primary Language: English Literacy level: able to read/write and comprehend.  Education Assessment and Provision:  Detailed education and instructions provided on heart failure disease management including the following:  Signs and symptoms of Heart Failure When to call the physician Importance of daily weights Low sodium diet Fluid restriction Medication  management Anticipated future follow-up appointments  Patient education given on each of the above topics.  Patient acknowledges understanding via teach back method and acceptance of all instructions.  Education Materials:  "Living Better With Heart Failure" Booklet, HF zone tool, & Daily Weight Tracker Tool.  Patient has scale at home: yes Patient has pill box at home: yes   Barriers of Care:   -dietary modifications  Considerations/Referrals:   Referral made to Heart Failure Pharmacist Stewardship: no Referral made to Heart Failure CSW/NCM TOC: yes, appreciated Referral made to Heart & Vascular TOC clinic: pending  Items for Follow-up on DC/TOC: -optimize -continue dietary/fluid education   Pricilla Holm, MSN, RN Heart Failure Nurse Navigator [phone]

## 2021-06-08 NOTE — ED Notes (Signed)
Per PA pt can eat - pt given sandwich bag

## 2021-06-08 NOTE — H&P (Signed)
History and Physical    Lawrence [name] ZBM:[id number] DOB: July 19, 1956 DOA: 06/07/2021  PCP: [name], PA-C Patient coming from: Home  Chief Complaint: Chest pain, shortness of breath  HPI: Lawrence [name] is a 65 y.o. male with medical history significant of chronic diastolic CHF, paroxysmal A. fib on Eliquis, moderate aortic stenosis, COPD, morbid obesity (BMI 47.38), seizure disorder, insulin-dependent type 2 diabetes, hypertension, hyperlipidemia, OSA on CPAP, chronic pain, BPH, mood disorder, gout, seizure disorder, GERD presents to the ED via EMS for evaluation of chest pain, shortness of breath, and bilateral lower extremity edema.  Oxygen saturation in the 80s with EMS, initially placed on nonrebreather and then 4 L supplemental oxygen.  Vital signs stable on arrival to the ED, satting 94% on room air.  Labs notable for WBC 11.0.  Creatinine 0.8.  High-sensitivity troponin negative x2.  BNP normal.  Chest x-ray showing bronchitic changes with bibasilar atelectasis. Patient was given IV Lasix 40 mg for diuresis.  Patient reports 2-week history of progressively worsening dyspnea on exertion, orthopnea, and bilateral lower extremity edema.  He felt that his symptoms were due to A. fib as he has a heart monitor at home which showed A. fib 3 times.  Tonight he experienced left-sided chest pain at rest which he describes as "someone thumping you in the chest with a finger."  Chest pain has now resolved and his breathing has improved significantly after he received Lasix in the emergency room.  Patient states he watches his dietary sodium and fluid intake.  Limits fluid intake to less than 1.5 L/day.  He cooks his own meals and does not eat too much salt.  He takes spironolactone and torsemide, has not missed any doses of any of his home medications.  Also takes Eliquis regularly and has not missed any doses.  Reports bilateral lower extremity erythema.  Denies history of blood clots.  No other  complaints.  Review of Systems:  All systems reviewed and apart from history of presenting illness, are negative.  Past Medical History:  Diagnosis Date   Acquired dilation of ascending aorta and aortic root ([place])    83mm by echo 01/2021   Adenomatous colon polyp 2007   Anemia    Anxiety    Aortic stenosis    moderate AS by echo 01/2021   Asthma    BPH without obstruction/lower urinary tract symptoms 02/22/2017   Chronic diastolic (congestive) heart failure (HCC)    Chronic venous stasis 03/07/2019   COPD (chronic obstructive pulmonary disease) (HCC)    Coronary artery calcification seen on CAT scan    Depression    Diabetic neuropathy ([place]) 09/11/2019   History of colon polyps 08/24/2018   Hypertension    Morbid obesity (HCC)    OSA (obstructive sleep apnea)    Pain due to onychomycosis of toenails of both feet 09/11/2019   Peripheral neuropathy 02/22/2017   Primary osteoarthritis, left shoulder 03/05/2017   PTSD (post-traumatic stress disorder)    Pure hypercholesterolemia    QT prolongation 03/07/2019   Seizures (HCC)    Shortness of breath    Sinus tachycardia 03/07/2019   Sleep apnea    CPAP   Type 2 diabetes mellitus with vascular disease ([place]) 09/11/2019    Past Surgical History:  Procedure Laterality Date   ENDOVENOUS ABLATION SAPHENOUS VEIN W/ LASER Right 08/20/2020   endovenous laser ablation right greater saphenous vein by Gae Gallop MD    JOINT REPLACEMENT     left knee replacement x 2  KNEE ARTHROSCOPY Bilateral    LEFT HEART CATH AND CORONARY ANGIOGRAPHY N/A 01/17/2018   Procedure: LEFT HEART CATH AND CORONARY ANGIOGRAPHY;  Surgeon: Leonie Man, MD;  Location: [facility];  Service: Cardiovascular;  Laterality: N/A;   UMBILICAL HERNIA REPAIR       reports that he quit smoking about 5 years ago. His smoking use included cigarettes. He has never used smokeless tobacco. He reports current alcohol use. He reports that he does not use  drugs.  Allergies  Allergen Reactions   Vancomycin Other (See Comments)    "Red Man Syndrome" 02/02/17: possible cause for rash under both arms   Niacin Other (See Comments)   Niacin And Related Other (See Comments)    Red man syndrome   Tubersol [Tuberculin] Other (See Comments)    Reaction unknown   Doxycycline Rash and Other (See Comments)    Family History  Problem Relation Age of Onset   CAD Maternal Grandfather    Diabetes Other    Diabetes Mellitus II Neg Hx    Colon cancer Neg Hx    Esophageal cancer Neg Hx    Inflammatory bowel disease Neg Hx    Liver disease Neg Hx    Pancreatic cancer Neg Hx    Rectal cancer Neg Hx    Stomach cancer Neg Hx    Sleep apnea Neg Hx     Prior to Admission medications   Medication Sig Start Date End Date Taking? Authorizing Provider  Accu-Chek FastClix Lancets MISC 1 each by Other route in the morning and at bedtime. 12/22/20  Yes [provider]  acetaminophen (TYLENOL) 325 MG tablet Take 2 tablets (650 mg total) by mouth every 4 (four) hours as needed for mild pain (or temp > 37.5 C (99.5 F)). 01/26/21  Yes Cherene Altes, MD  albuterol (VENTOLIN HFA) 108 (90 Base) MCG/ACT inhaler Inhale 2 puffs into the lungs every 6 (six) hours as needed for wheezing or shortness of breath. 03/10/20  Yes [provider]  Alcohol Swabs (ALCOHOL PREP) 70 % PADS 1 each by Other route in the morning, at noon, and at bedtime. 12/22/20  Yes [provider]  allopurinol (ZYLOPRIM) 100 MG tablet Take 200 mg by mouth daily.   Yes [provider]  apixaban (ELIQUIS) 5 MG TABS tablet Take 1 tablet (5 mg total) by mouth 2 (two) times daily. 01/07/21  Yes Turner, Traci R, MD  ARIPiprazole (ABILIFY) 5 MG tablet TAKE ONE TABLET BY MOUTH EVERYDAY AT BEDTIME Patient taking differently: Take 5 mg by mouth at bedtime. 04/20/21  Yes Mozingo, Berdie Ogren, NP  atorvastatin (LIPITOR) 40 MG tablet TAKE ONE TABLET BY MOUTH DAILY AT  6PM Patient taking differently: Take 40 mg by mouth every evening. TAKE ONE TABLET BY MOUTH DAILY AT 6PM 01/07/21  Yes Turner, Eber Hong, MD  B-D ULTRAFINE III SHORT PEN 31G X 8 MM MISC 1 each by Other route See admin instructions. Use to inject insulin subcutaneously at bedtime as directed. 11/20/20  Yes [provider]  blood glucose meter kit and supplies Dispense based on patient and insurance preference. Use up to four times daily as directed. (FOR ICD-10 E10.9, E11.9). 11/09/17  Yes Eugenie Filler, MD  busPIRone (BUSPAR) 5 MG tablet TAKE ONE TABLET BY MOUTH THREE TIMES DAILY Patient taking differently: Take 5 mg by mouth 3 (three) times daily. 04/20/21  Yes Mozingo, Berdie Ogren, NP  cetirizine (ZYRTEC) 10 MG tablet Take 10 mg by  mouth at bedtime.    Yes [provider]  dapagliflozin propanediol (FARXIGA) 10 MG TABS tablet Take 1 tablet (10 mg total) by mouth daily before breakfast. 01/05/21  Yes Renato Shin, MD  diltiazem (CARDIZEM CD) 180 MG 24 hr capsule TAKE ONE CAPSULE BY MOUTH ONCE DAILY Patient taking differently: Take 180 mg by mouth daily. 05/07/21  Yes Turner, Eber Hong, MD  Dulaglutide (TRULICITY) 4.5 KD/9.8PJ SOPN Inject 4.5 mg as directed once a week. Patient taking differently: Inject 4.5 mg as directed every Monday. 11/19/20  Yes Renato Shin, MD  ferrous sulfate 324 (65 Fe) MG TBEC Take 324 mg by mouth daily with breakfast.    Yes [provider]  fluticasone (FLONASE) 50 MCG/ACT nasal spray Place 2 sprays into both nostrils daily.    Yes [provider]  insulin glargine (LANTUS SOLOSTAR) 100 UNIT/ML Solostar Pen Inject 5 Units into the skin every morning. 04/08/21  Yes Renato Shin, MD  isosorbide mononitrate (IMDUR) 30 MG 24 hr tablet Take 1 tablet (30 mg total) by mouth daily. 01/07/21  Yes Turner, Eber Hong, MD  metFORMIN (GLUCOPHAGE) 1000 MG tablet Take 1,000 mg by mouth 2 (two) times daily with a meal.   Yes [provider]   metoprolol succinate (TOPROL-XL) 50 MG 24 hr tablet Take 1 tablet (50 mg total) by mouth daily. Take with or immediately following a meal. 05/20/21 08/18/21 Yes Turner, Eber Hong, MD  omeprazole (PRILOSEC) 20 MG capsule Take 20 mg by mouth every morning. 05/29/20  Yes [provider]  Oxycodone HCl 10 MG TABS Take 10 mg by mouth every 8 (eight) hours. 05/05/21 07/04/21 Yes [provider]  potassium chloride SA (KLOR-CON) 20 MEQ tablet Take 2 tablets (40 mEq total) by mouth daily. 01/07/21  Yes Turner, Eber Hong, MD  prazosin (MINIPRESS) 2 MG capsule Take 2 capsules (4 mg total) by mouth at bedtime. 01/07/21  Yes Turner, Eber Hong, MD  pregabalin (LYRICA) 100 MG capsule TAKE ONE CAPSULE BY MOUTH THREE TIMES DAILY Patient taking differently: Take 100 mg by mouth 3 (three) times daily. Take along with 50 mg capsules 05/05/21  Yes Camara, Amadou, MD  pregabalin (LYRICA) 50 MG capsule Take 1 capsule (50 mg total) by mouth 3 (three) times daily. Patient taking differently: Take 50 mg by mouth 3 (three) times daily. Along with 100 mg capsules 04/22/21  Yes Camara, [name], MD  repaglinide (PRANDIN) 2 MG tablet Take 1 tablet (2 mg total) by mouth 2 (two) times daily before a meal. 04/20/21  Yes Renato Shin, MD  spironolactone (ALDACTONE) 25 MG tablet Take 1 tablet (25 mg total) by mouth daily. 01/07/21  Yes Turner, Eber Hong, MD  temazepam (RESTORIL) 15 MG capsule TAKE ONE CAPSULE BY MOUTH EVERYDAY AT BEDTIME Patient taking differently: Take 15 mg by mouth at bedtime. 04/20/21  Yes Mozingo, Berdie Ogren, NP  torsemide (DEMADEX) 20 MG tablet Take 2 tablets (40 mg total) in the morning and 1 tablet (20 mg total) in the evening. Patient taking differently: Take 40 mg by mouth 2 (two) times daily. 01/07/21  Yes Turner, Eber Hong, MD  traZODone (DESYREL) 100 MG tablet Take 2 tablets (200 mg total) by mouth at bedtime. 12/31/20  Yes Mozingo, Berdie Ogren, NP  venlafaxine XR (EFFEXOR-XR) 75 MG 24 hr capsule Take  3 capsules (225 mg total) by mouth daily with breakfast. 12/10/20  Yes Mozingo, Berdie Ogren, NP  vitamin B-12 1000 MCG tablet Take 1 tablet (1,000 mcg total) by mouth  daily. 04/28/21  Yes Mercy Riding, MD  Cholecalciferol (VITAMIN D3 PO) Take 2,000 Units by mouth daily.    [provider]    Physical Exam: Vitals:   06/07/21 2315 06/07/21 2345 06/08/21 0030 06/08/21 0145  BP:  119/77 127/79 116/80  Pulse: 66 88 93 95  Resp: $Remo'16 16 18 14  'hrIJF$ Temp:      TempSrc:      SpO2: 96% 95% 94% 95%  Weight:        Physical Exam Constitutional:      General: He is not in acute distress. HENT:     Head: Normocephalic and atraumatic.  Eyes:     Extraocular Movements: Extraocular movements intact.     Conjunctiva/sclera: Conjunctivae normal.  Cardiovascular:     Rate and Rhythm: Normal rate and regular rhythm.     Pulses: Normal pulses.  Pulmonary:     Effort: Pulmonary effort is normal. No respiratory distress.     Breath sounds: Rales present. No wheezing.  Abdominal:     General: Bowel sounds are normal. There is no distension.     Palpations: Abdomen is soft.     Tenderness: There is no abdominal tenderness.  Musculoskeletal:     Cervical back: Normal range of motion and neck supple.     Right lower leg: Edema present.     Left lower leg: Edema present.  Skin:    General: Skin is warm and dry.     Comments: Bilateral lower extremities erythematous and slightly warm to touch  Neurological:     General: No focal deficit present.     Mental Status: He is alert and oriented to person, place, and time.       Labs on Admission: I have personally reviewed following labs and imaging studies  CBC: Recent Labs  Lab 06/07/21 1521  WBC 11.0*  HGB 13.8  HCT 44.3  MCV 93.1  PLT 349   Basic Metabolic Panel: Recent Labs  Lab 06/07/21 1521  NA 138  K 3.6  CL 97*  CO2 28  GLUCOSE 113*  BUN 18  CREATININE 0.88  CALCIUM 9.5   GFR: Estimated Creatinine Clearance:  139.5 mL/min (by C-G formula based on SCr of 0.88 mg/dL). Liver Function Tests: No results for input(s): AST, ALT, ALKPHOS, BILITOT, PROT, ALBUMIN in the last 168 hours. No results for input(s): LIPASE, AMYLASE in the last 168 hours. No results for input(s): AMMONIA in the last 168 hours. Coagulation Profile: No results for input(s): INR, PROTIME in the last 168 hours. Cardiac Enzymes: No results for input(s): CKTOTAL, CKMB, CKMBINDEX, TROPONINI in the last 168 hours. BNP (last 3 results) No results for input(s): PROBNP in the last 8760 hours. HbA1C: No results for input(s): HGBA1C in the last 72 hours. CBG: Recent Labs  Lab 06/08/21 0154  GLUCAP 172*   Lipid Profile: No results for input(s): CHOL, HDL, LDLCALC, TRIG, CHOLHDL, LDLDIRECT in the last 72 hours. Thyroid Function Tests: No results for input(s): TSH, T4TOTAL, FREET4, T3FREE, THYROIDAB in the last 72 hours. Anemia Panel: No results for input(s): VITAMINB12, FOLATE, FERRITIN, TIBC, IRON, RETICCTPCT in the last 72 hours. Urine analysis:    Component Value Date/Time   COLORURINE YELLOW 04/27/2021 0304   APPEARANCEUR CLEAR 04/27/2021 0304   LABSPEC 1.010 04/27/2021 0304   PHURINE 6.0 04/27/2021 0304   GLUCOSEU >=500 (A) 04/27/2021 0304   HGBUR NEGATIVE 04/27/2021 0304   BILIRUBINUR NEGATIVE 04/27/2021 0304   KETONESUR NEGATIVE 04/27/2021 0304   PROTEINUR  NEGATIVE 04/27/2021 0304   NITRITE NEGATIVE 04/27/2021 0304   LEUKOCYTESUR NEGATIVE 04/27/2021 0304    Radiological Exams on Admission: DG Chest 2 View  Result Date: 06/07/2021 CLINICAL DATA:  Chest pain and shortness of breath for several days, CHF, COPD, coronary artery disease, diabetes mellitus, former smoker EXAM: CHEST - 2 VIEW COMPARISON:  04/27/2021 FINDINGS: Upper normal heart size. Mediastinal contours and pulmonary vascularity normal. Bronchitic changes with bibasilar atelectasis. Lungs otherwise clear. No pulmonary infiltrate, pleural effusion, or  pneumothorax. Scattered endplate spur formation thoracic spine. IMPRESSION: Bronchitic changes with bibasilar atelectasis. Electronically Signed   By: Lavonia Dana M.D.   On: 06/07/2021 15:45    EKG: Independently reviewed.  Sinus rhythm with marked arrhythmia, first-degree AV block.  Assessment/Plan Principal Problem:   CHF exacerbation (HCC) Active Problems:   OSA (obstructive sleep apnea)   Essential hypertension, benign   Lower extremity cellulitis   COPD (chronic obstructive pulmonary disease) (HCC)   Possible acute on chronic diastolic CHF Patient here with complaints of progressively worsening dyspnea on exertion, orthopnea, and bilateral lower extremity edema x2 weeks.  Echo done done in June 2022 showing normal systolic function and grade 2 diastolic dysfunction.  Also showing moderate aortic stenosis which could also be contributing to his symptoms.  Although chest x-ray is not showing pulmonary edema, he does appear volume overloaded on exam with rales and bilateral lower extremity edema.  Difficult to assess for JVD given his body habitus.  BNP likely falsely low in the setting of morbid obesity.  Patient reports improvement of symptoms after receiving IV Lasix 40 mg in the ED.  Weight recorded today might not be accurate as it is down approximately 7 kg since weight recorded a month ago.  PE is also on the differential but felt to be less likely as patient reports compliance with Eliquis.  He is not tachycardic.  Reportedly hypoxic to the 80s with EMS but currently satting in the mid 90s on room air, no tachypnea or respiratory distress. -Continue IV Lasix 40 mg twice daily.  Continue home metoprolol and [place].  Repeat echocardiogram.  Monitor intake and output, daily weights.  Low-sodium diet with fluid restriction.  Check D-dimer level, if elevated, then CT angiogram chest to rule out PE.  Chest pain ACS less likely as high-sensitivity troponin negative x2.  Nuclear medicine stress  test done in April 2022 was low risk.  Chest pain has resolved and appears comfortable. -Continue to monitor  Possible bilateral lower extremity cellulitis Bilateral lower extremities erythematous and slightly warm to touch.  White blood cell count borderline elevated.  No fever or signs of sepsis.  DVT felt to be less likely as patient reports compliance with Eliquis. -Start ceftriaxone, monitor WBC count.  Check D-dimer level, if elevated, order bilateral lower extremity Dopplers.  Paroxysmal A. Fib Current rhythm is sinus arrhythmia and rate normal. -Continue Eliquis, Cardizem, metoprolol  Moderate aortic stenosis Per echo done June 2022. -Repeat echocardiogram ordered  COPD Stable.  No wheezing. -DuoNeb as needed  Insulin-dependent type 2 diabetes A1c 7.8 on 04/27/2021. -Continue home basal insulin and Farxiga.  Order sliding scale insulin sensitive ACHS.  Hypertension Stable. -Continue Cardizem, Imdur, metoprolol, prazosin  Hyperlipidemia -Continue Lipitor  OSA -Continue nightly CPAP  Chronic pain -Continue home oxycodone, Lyrica  Mood disorder -Continue Abilify, BuSpar, venlafaxine  Gout -Continue allopurinol  GERD -Continue Protonix  DVT prophylaxis: Eliquis Code Status: Patient wishes to be DNR. Family Communication: Diagnostic findings and treatment plan discussed with the  patient.  No family available at this time. Disposition Plan: Status is: Inpatient  Remains inpatient appropriate because: Needs IV diuresis for decompensated CHF  Level of care: Level of care: Telemetry Cardiac  The medical decision making on this patient was of high complexity and the patient is at high risk for clinical deterioration, therefore this is a level 3 visit.  Shela Leff MD [facility]  If 7PM-7AM, please contact night-coverage [email or web address]  06/08/2021, 2:13 AM

## 2021-06-08 NOTE — Care Plan (Signed)
This 65 years old male with PMH significant for chronic diastolic CHF, paroxysmal A. fib on Eliquis, moderate aortic stenosis, COPD, morbid obesity, seizure disorder, insulin-dependent type 2 diabetes, hypertension, hyperlipidemia, OSA on CPAP, chronic pain syndrome, BPH, mood disorder, gout, seizure disorder, GERD presented to the ED for the evaluation of chest pain associated with shortness of breath and bilateral lower extremity edema.  Patient was initially hypoxic on arrival requiring nonrebreather and then weaned down to 4 L of supplemental oxygen via nasal cannula. Patient is admitted for possible acute on chronic diastolic CHF, continued on diuresis 40 mg Lasix twice daily,  obtain repeat echocardiogram.  ACS is less likely as high-sensitivity troponin negative.  Found to have bilateral lower extremity cellulitis and started on IV ceftriaxone.  A. fib remains rate controlled continued on Eliquis and Cardizem.  Patient was seen and examined.

## 2021-06-08 NOTE — Progress Notes (Signed)
Visit made to patients room to discuss CPAP needs for the night.  Patient states he wears a FFM at home.  I advised patient I will return at bedtime and set him up on CPAP.

## 2021-06-09 DIAGNOSIS — L03119 Cellulitis of unspecified part of limb: Secondary | ICD-10-CM | POA: Diagnosis not present

## 2021-06-09 DIAGNOSIS — J449 Chronic obstructive pulmonary disease, unspecified: Secondary | ICD-10-CM | POA: Diagnosis not present

## 2021-06-09 DIAGNOSIS — I5033 Acute on chronic diastolic (congestive) heart failure: Secondary | ICD-10-CM | POA: Diagnosis not present

## 2021-06-09 DIAGNOSIS — I10 Essential (primary) hypertension: Secondary | ICD-10-CM

## 2021-06-09 DIAGNOSIS — I509 Heart failure, unspecified: Secondary | ICD-10-CM | POA: Diagnosis not present

## 2021-06-09 DIAGNOSIS — G4733 Obstructive sleep apnea (adult) (pediatric): Secondary | ICD-10-CM | POA: Diagnosis not present

## 2021-06-09 LAB — CBC
HCT: 42.3 % (ref 39.0–52.0)
Hemoglobin: 13.7 g/dL (ref 13.0–17.0)
MCH: 29.4 pg (ref 26.0–34.0)
MCHC: 32.4 g/dL (ref 30.0–36.0)
MCV: 90.8 fL (ref 80.0–100.0)
Platelets: 259 10*3/uL (ref 150–400)
RBC: 4.66 MIL/uL (ref 4.22–5.81)
RDW: 15.1 % (ref 11.5–15.5)
WBC: 10.6 10*3/uL — ABNORMAL HIGH (ref 4.0–10.5)
nRBC: 0 % (ref 0.0–0.2)

## 2021-06-09 LAB — BASIC METABOLIC PANEL
Anion gap: 8 (ref 5–15)
BUN: 12 mg/dL (ref 8–23)
CO2: 29 mmol/L (ref 22–32)
Calcium: 9.1 mg/dL (ref 8.9–10.3)
Chloride: 98 mmol/L (ref 98–111)
Creatinine, Ser: 0.89 mg/dL (ref 0.61–1.24)
GFR, Estimated: >60 mL/min (ref >60)
Glucose, Bld: 146 mg/dL — ABNORMAL HIGH (ref 70–99)
Potassium: 3.7 mmol/L (ref 3.5–5.1)
Sodium: 135 mmol/L (ref 135–145)

## 2021-06-09 LAB — GLUCOSE, CAPILLARY
Glucose-Capillary: 142 mg/dL — ABNORMAL HIGH (ref 70–99)
Glucose-Capillary: 130 mg/dL — ABNORMAL HIGH (ref 70–99)
Glucose-Capillary: 120 mg/dL — ABNORMAL HIGH (ref 70–99)
Glucose-Capillary: 111 mg/dL — ABNORMAL HIGH (ref 70–99)

## 2021-06-09 MED ORDER — FLUTICASONE PROPIONATE 50 MCG/ACT NA SUSP
2.0000 | Freq: Every day | NASAL | Status: DC
  Administered 2021-06-09 – 2021-06-10 (×2): 2 via NASAL
  Filled 2021-06-09: qty 16

## 2021-06-09 MED ORDER — INSULIN ASPART 100 UNIT/ML IJ SOLN: 0.0000 [IU] | Freq: Three times a day (TID) | INTRAMUSCULAR | Status: DC

## 2021-06-09 MED ORDER — INSULIN GLARGINE-YFGN 100 UNIT/ML ~~LOC~~ SOLN
5.0000 [IU] | Freq: Every day | SUBCUTANEOUS | Status: DC
  Administered 2021-06-09 – 2021-06-10 (×2): 5 [IU] via SUBCUTANEOUS
  Filled 2021-06-09 (×5): qty 0.05

## 2021-06-09 MED ORDER — CEFADROXIL 500 MG PO CAPS
1000.0000 mg | ORAL_CAPSULE | Freq: Two times a day (BID) | ORAL | Status: DC
  Administered 2021-06-09 – 2021-06-10 (×3): 1000 mg via ORAL
  Filled 2021-06-09 (×4): qty 2

## 2021-06-09 MED ORDER — FUROSEMIDE 10 MG/ML IJ SOLN
60.0000 mg | Freq: Two times a day (BID) | INTRAMUSCULAR | Status: DC
  Administered 2021-06-09 – 2021-06-10 (×2): 60 mg via INTRAVENOUS
  Filled 2021-06-09 (×2): qty 6

## 2021-06-09 MED ORDER — SPIRONOLACTONE 25 MG PO TABS
25.0000 mg | ORAL_TABLET | Freq: Every day | ORAL | Status: DC
  Administered 2021-06-09 – 2021-06-10 (×2): 25 mg via ORAL
  Filled 2021-06-09 (×2): qty 1

## 2021-06-09 MED ORDER — REPAGLINIDE 1 MG PO TABS
2.0000 mg | ORAL_TABLET | Freq: Two times a day (BID) | ORAL | Status: DC
  Administered 2021-06-09 – 2021-06-10 (×3): 2 mg via ORAL
  Filled 2021-06-09: qty 2
  Filled 2021-06-09: qty 1
  Filled 2021-06-09: qty 2
  Filled 2021-06-09 (×2): qty 1
  Filled 2021-06-09: qty 2
  Filled 2021-06-09: qty 1
  Filled 2021-06-09: qty 2
  Filled 2021-06-09: qty 1

## 2021-06-09 NOTE — Progress Notes (Signed)
.    Progress Note    Lawrence [name]  KWI:[id number] DOB: Dec 14, 1955  DOA: 06/07/2021 PCP: [name], PA-C    Brief Narrative:     Medical records reviewed and are as summarized below:  Lawrence [name] is an 65 y.o. male with PMH significant for chronic diastolic CHF, paroxysmal A. fib on Eliquis, moderate aortic stenosis, COPD, morbid obesity, seizure disorder, insulin-dependent type 2 diabetes, hypertension, hyperlipidemia, OSA on CPAP, chronic pain syndrome, BPH, mood disorder, gout, seizure disorder, GERD presented to the ED for the evaluation of chest pain associated with shortness of breath and bilateral lower extremity edema.  Patient was initially hypoxic on arrival requiring nonrebreather and then weaned down to 4 L of supplemental oxygen via nasal cannula.  Assessment/Plan:   Principal Problem:   CHF exacerbation (HCC) Active Problems:   OSA (obstructive sleep apnea)   Essential hypertension, benign   Lower extremity cellulitis   COPD (chronic obstructive pulmonary disease) (HCC)   acute on chronic diastolic CHF Patient here with complaints of progressively worsening dyspnea on exertion, orthopnea, and bilateral lower extremity edema x2 weeks.  Echo done done in June 2022 showing normal systolic function and grade 2 diastolic dysfunction.  Also showing moderate aortic stenosis which could also be contributing to his symptoms.   - appear volume overloaded on exam with rales and bilateral lower extremity edema.  Difficult to assess for JVD given his body habitus.   -BNP likely falsely low in the setting of morbid obesity.   - reports improvement of symptoms after receiving IV Lasix 40 mg in the ED.   - Reportedly hypoxic to the 80s with EMS but currently satting in the mid 90s on room air, no tachypnea or respiratory distress. -Continue IV Lasix 40 mg twice daily.  Continue home metoprolol and [place].   -Low-sodium diet with fluid restriction.   - dd imer negative   -patient thinks his normal weight is 375lbs  Chest pain ACS less likely as high-sensitivity troponin negative x2.  Nuclear medicine stress test done in April 2022 was low risk.  Chest pain has resolved and appears comfortable. -Continue to monitor   Possible bilateral lower extremity cellulitis -change to PO abx for short course  Paroxysmal A. Fib Current rhythm is sinus arrhythmia and rate normal. -Continue Eliquis, Cardizem, metoprolol   Moderate aortic stenosis Per echo done June 2022. -Repeat echocardiogram ordered   COPD Stable.  No wheezing. -DuoNeb as needed   Insulin-dependent type 2 diabetes A1c 7.8 on 04/27/2021. -Continue home basal insulin and Farxiga.  Order sliding scale insulin sensitive ACHS.   Hypertension Stable. -Continue Cardizem, Imdur, metoprolol, prazosin   Hyperlipidemia -Continue Lipitor   OSA -Continue nightly CPAP   Chronic pain -Continue home oxycodone, Lyrica   Mood disorder -Continue Abilify, BuSpar, venlafaxine   Gout -Continue allopurinol   GERD -Continue Protonix  obesity Body mass index is 48.94 kg/m.   Family Communication/Anticipated D/C date and plan/Code Status    Code Status: dnr  Disposition Plan: Status is: Inpatient  Remains inpatient appropriate because: needs IV lasix         Medical Consultants:   None.     Subjective:   Breathing continues to get better Denies any dietary indiscretions   Objective:    Vitals:   06/09/21 0014 06/09/21 0503 06/09/21 0808 06/09/21 1124  BP: 125/78 (!) 137/93 125/85 124/76  Pulse:  76 91 86  Resp: 12 18 20 19   Temp: 98.4 F (36.9 C) 97.6 F (36.4  C) 97.9 F (36.6 C) 97.7 F (36.5 C)  TempSrc: Oral Oral Oral Oral  SpO2: 97% 94% 91% 93%  Weight:  (!) 172.9 kg    Height:        Intake/Output Summary (Last 24 hours) at 06/09/2021 1247 Last data filed at 06/09/2021 1219 Gross per 24 hour  Intake 1074 ml  Output 3375 ml  Net -2301 ml   Filed  Weights   06/07/21 2310 06/08/21 1346 06/09/21 0503  Weight: (!) 167.4 kg (!) 174.3 kg (!) 172.9 kg    Exam:  General: Appearance:    Severely obese male in no acute distress   Chronic changes to skin b/l  Lungs:     Clear to auscultation bilaterally, respirations unlabored  Heart:    Normal heart rate.   MS:   All extremities are intact.    Neurologic:   Awake, alert, oriented x 3. No apparent focal neurological           defect.      Data Reviewed:   I have personally reviewed following labs and imaging studies:  Labs: Labs show the following:   Basic Metabolic Panel: Recent Labs  Lab 06/07/21 1521 06/09/21 0754  NA 138 135  K 3.6 3.7  CL 97* 98  CO2 28 29  GLUCOSE 113* 146*  BUN 18 12  CREATININE 0.88 0.89  CALCIUM 9.5 9.1   GFR Estimated Creatinine Clearance: 140.5 mL/min (by C-G formula based on SCr of 0.89 mg/dL). Liver Function Tests: No results for input(s): AST, ALT, ALKPHOS, BILITOT, PROT, ALBUMIN in the last 168 hours. No results for input(s): LIPASE, AMYLASE in the last 168 hours. No results for input(s): AMMONIA in the last 168 hours. Coagulation profile No results for input(s): INR, PROTIME in the last 168 hours.  CBC: Recent Labs  Lab 06/07/21 1521 06/08/21 0500 06/09/21 0754  WBC 11.0* 10.6* 10.6*  HGB 13.8 13.6 13.7  HCT 44.3 41.9 42.3  MCV 93.1 90.7 90.8  PLT 295 264 259   Cardiac Enzymes: No results for input(s): CKTOTAL, CKMB, CKMBINDEX, TROPONINI in the last 168 hours. BNP (last 3 results) No results for input(s): PROBNP in the last 8760 hours. CBG: Recent Labs  Lab 06/08/21 1347 06/08/21 1643 06/08/21 2114 06/09/21 0607 06/09/21 1125  GLUCAP 138* 117* 135* 142* 130*   D-Dimer: Recent Labs    06/08/21 0149  DDIMER 0.38   Hgb A1c: No results for input(s): HGBA1C in the last 72 hours. Lipid Profile: No results for input(s): CHOL, HDL, LDLCALC, TRIG, CHOLHDL, LDLDIRECT in the last 72 hours. Thyroid function  studies: No results for input(s): TSH, T4TOTAL, T3FREE, THYROIDAB in the last 72 hours.  Invalid input(s): FREET3 Anemia work up: No results for input(s): VITAMINB12, FOLATE, FERRITIN, TIBC, IRON, RETICCTPCT in the last 72 hours. Sepsis Labs: Recent Labs  Lab 06/07/21 1521 06/08/21 0500 06/09/21 0754  WBC 11.0* 10.6* 10.6*    Microbiology Recent Results (from the past 240 hour(s))  Resp Panel by RT-PCR (Flu A&B, Covid) Nasopharyngeal Swab     Status: None   Collection Time: 06/08/21  5:34 AM   Specimen: Nasopharyngeal Swab; Nasopharyngeal(NP) swabs in vial transport medium  Result Value Ref Range Status   SARS Coronavirus 2 by RT PCR NEGATIVE NEGATIVE Final    Comment: (NOTE) SARS-CoV-2 target nucleic acids are NOT DETECTED.  The SARS-CoV-2 RNA is generally detectable in upper respiratory specimens during the acute phase of infection. The lowest concentration of SARS-CoV-2 viral copies this assay  can detect is 138 copies/mL. A negative result does not preclude SARS-Cov-2 infection and should not be used as the sole basis for treatment or other patient management decisions. A negative result may occur with  improper specimen collection/handling, submission of specimen other than nasopharyngeal swab, presence of viral mutation(s) within the areas targeted by this assay, and inadequate number of viral copies(<138 copies/mL). A negative result must be combined with clinical observations, patient history, and epidemiological information. The expected result is Negative.  Fact Sheet for Patients:  EntrepreneurPulse.com.au  Fact Sheet for Healthcare Providers:  IncredibleEmployment.be  This test is no t yet approved or cleared by the [place] FDA and  has been authorized for detection and/or diagnosis of SARS-CoV-2 by FDA under an Emergency Use Authorization (EUA). This EUA will remain  in effect (meaning this test can be used) for the  duration of the COVID-19 declaration under Section 564(b)(1) of the Act, 21 U.S.C.section 360bbb-3(b)(1), unless the authorization is terminated  or revoked sooner.       Influenza A by PCR NEGATIVE NEGATIVE Final   Influenza B by PCR NEGATIVE NEGATIVE Final    Comment: (NOTE) The Xpert Xpress SARS-CoV-2/FLU/RSV plus assay is intended as an aid in the diagnosis of influenza from Nasopharyngeal swab specimens and should not be used as a sole basis for treatment. Nasal washings and aspirates are unacceptable for Xpert Xpress SARS-CoV-2/FLU/RSV testing.  Fact Sheet for Patients: EntrepreneurPulse.com.au  Fact Sheet for Healthcare Providers: IncredibleEmployment.be  This test is not yet approved or cleared by the [place] FDA and has been authorized for detection and/or diagnosis of SARS-CoV-2 by FDA under an Emergency Use Authorization (EUA). This EUA will remain in effect (meaning this test can be used) for the duration of the COVID-19 declaration under Section 564(b)(1) of the Act, 21 U.S.C. section 360bbb-3(b)(1), unless the authorization is terminated or revoked.  Performed at [facility], [place] [address], [place], [place] [address]     Procedures and diagnostic studies:  DG Chest 2 View  Result Date: 06/07/2021 CLINICAL DATA:  Chest pain and shortness of breath for several days, CHF, COPD, coronary artery disease, diabetes mellitus, former smoker EXAM: CHEST - 2 VIEW COMPARISON:  04/27/2021 FINDINGS: Upper normal heart size. Mediastinal contours and pulmonary vascularity normal. Bronchitic changes with bibasilar atelectasis. Lungs otherwise clear. No pulmonary infiltrate, pleural effusion, or pneumothorax. Scattered endplate spur formation thoracic spine. IMPRESSION: Bronchitic changes with bibasilar atelectasis. Electronically Signed   By: Lavonia Dana M.D.   On: 06/07/2021 15:45   ECHOCARDIOGRAM COMPLETE  Result Date:  06/08/2021    ECHOCARDIOGRAM REPORT   Patient Name:   Lawrence [name] Date of Exam: 06/08/2021 Medical Rec #:  [id number]      Height:       74.0 in Accession #:    [id number]     Weight:       369.0 lb Date of Birth:  06/11/1956      BSA:          2.820 m Patient Age:    89 years       BP:           117/68 mmHg Patient Gender: M              HR:           89 bpm. Exam Location:  Inpatient Procedure: 2D Echo, Cardiac Doppler and Color Doppler Indications:    T06.26 Acute diastolic (congestive) heart failure  History:  Patient has prior history of Echocardiogram examinations, most                 recent 01/26/2021. CHF, COPD; Risk Factors:Hypertension.  Sonographer:    Glo Herring Referring Phys: [id number] [place]  1. Left ventricular ejection fraction, by estimation, is 60 to 65%. The left ventricle has normal function. The left ventricle has no regional wall motion abnormalities. The left ventricular internal cavity size was mildly dilated. There is mild concentric left ventricular hypertrophy. Left ventricular diastolic parameters were normal.  2. Right ventricular systolic function is normal. The right ventricular size is normal.  3. The mitral valve is normal in structure. Trivial mitral valve regurgitation.  4. AV is thickened, calcified Difficult to see well Peak and mean gradients through the valve arer 46 and 22 mm Hg respectively Dimensionless index is 0.27 consistent with moderate AS. Compared to echo from June 2022, mild increase in mean gradient (19 to 22 mm HG). The aortic valve is tricuspid. Aortic valve regurgitation is not visualized. FINDINGS  Left Ventricle: Left ventricular ejection fraction, by estimation, is 60 to 65%. The left ventricle has normal function. The left ventricle has no regional wall motion abnormalities. The left ventricular internal cavity size was mildly dilated. There is  mild concentric left ventricular hypertrophy. Left ventricular diastolic  parameters were normal. Right Ventricle: The right ventricular size is normal. Right vetricular wall thickness was not assessed. Right ventricular systolic function is normal. Left Atrium: Left atrial size was normal in size. Right Atrium: Right atrial size was normal in size. Pericardium: There is no evidence of pericardial effusion. Mitral Valve: The mitral valve is normal in structure. Mild mitral annular calcification. Trivial mitral valve regurgitation. Tricuspid Valve: The tricuspid valve is grossly normal. Tricuspid valve regurgitation is mild. Aortic Valve: AV is thickened, calcified Difficult to see well Peak and mean gradients through the valve arer 46 and 22 mm Hg respectively Dimensionless index is 0.27 consistent with moderate AS. Compared to echo from June 2022, mild increase in mean gradient (19 to 22 mm HG). The aortic valve is tricuspid. Aortic valve regurgitation is not visualized. Aortic valve mean gradient measures 20.2 mmHg. Aortic valve peak gradient measures 41.1 mmHg. Pulmonic Valve: The pulmonic valve was normal in structure. Pulmonic valve regurgitation is not visualized. Aorta: The aortic root and ascending aorta are structurally normal, with no evidence of dilitation. IAS/Shunts: No atrial level shunt detected by color flow Doppler.  LEFT VENTRICLE PLAX 2D LVIDd:         5.60 cm Diastology LVIDs:         3.70 cm LV e' medial:    8.38 cm/s LV PW:         1.30 cm LV E/e' medial:  8.0 LV IVS:        1.30 cm LV e' lateral:   5.44 cm/s                        LV E/e' lateral: 12.3  RIGHT VENTRICLE RV Basal diam:  4.30 cm RV S prime:     20.10 cm/s LEFT ATRIUM             Index        RIGHT ATRIUM           Index LA diam:        4.40 cm 1.56 cm/m   RA Area:     24.60 cm LA Vol (A2C):  68.4 ml 24.26 ml/m  RA Volume:   70.30 ml  24.93 ml/m LA Vol (A4C):   67.6 ml 23.97 ml/m LA Biplane Vol: 69.5 ml 24.65 ml/m  AORTIC VALVE                    PULMONIC VALVE AV Vmax:           320.40 cm/s  PV  Vmax:       1.00 m/s AV Vmean:          204.200 cm/s PV Peak grad:  4.0 mmHg AV VTI:            0.592 m AV Peak Grad:      41.1 mmHg AV Mean Grad:      20.2 mmHg LVOT Vmax:         87.03 cm/s LVOT Vmean:        61.667 cm/s LVOT VTI:          0.162 m LVOT/AV VTI ratio: 0.27  AORTA Ao Root diam: 3.90 cm Ao Asc diam:  3.70 cm MITRAL VALVE MV Area (PHT): 3.68 cm    SHUNTS MV Decel Time: 206 msec    Systemic VTI: 0.16 m MV E velocity: 66.80 cm/s MV A velocity: 49.30 cm/s MV E/A ratio:  1.35 Dorris Carnes MD Electronically signed by Dorris Carnes MD Signature Date/Time: 06/08/2021/5:00:21 PM    Final     Medications:    allopurinol  200 mg Oral Daily   apixaban  5 mg Oral BID   ARIPiprazole  5 mg Oral QHS   atorvastatin  40 mg Oral QPM   busPIRone  5 mg Oral TID   cefadroxil  1,000 mg Oral BID   dapagliflozin propanediol  10 mg Oral QAC breakfast   diltiazem  180 mg Oral Daily   furosemide  40 mg Intravenous BID   insulin aspart  0-5 Units Subcutaneous QHS   insulin aspart  0-9 Units Subcutaneous TID WC   insulin glargine-yfgn  5 Units Subcutaneous Daily   isosorbide mononitrate  30 mg Oral Daily   metoprolol succinate  50 mg Oral Daily   pantoprazole  40 mg Oral Daily   prazosin  4 mg Oral QHS   pregabalin  150 mg Oral TID   temazepam  15 mg Oral QHS   traZODone  200 mg Oral QHS   venlafaxine XR  225 mg Oral Q breakfast   Continuous Infusions:   LOS: 1 day   Geradine Girt  [facility]   How to contact the [facility] Attending or Consulting provider 7A - 7P or covering provider during after hours 7P -7A, for this patient?  Check the care team in [facility] and look for a) attending/consulting TRH provider listed and b) the [facility] team listed Log into [email or web address] and use [place]'s universal password to access. If you do not have the password, please contact the hospital operator. Locate the [facility] provider you are looking for under [facility] and page to a number that you can be directly  reached. If you still have difficulty reaching the provider, please page the [facility] (Director on Call) for the Hospitalists listed on amion for assistance.  06/09/2021, 12:47 PM

## 2021-06-09 NOTE — Plan of Care (Signed)
?  Problem: Education: ?Goal: Ability to demonstrate management of disease process will improve ?Outcome: Progressing ?  ?Problem: Activity: ?Goal: Capacity to carry out activities will improve ?Outcome: Progressing ?  ?Problem: Cardiac: ?Goal: Ability to achieve and maintain adequate cardiopulmonary perfusion will improve ?Outcome: Progressing ?  ?

## 2021-06-09 NOTE — TOC Initial Note (Signed)
Transition of Care [facility]) - Initial/Assessment Note    Patient Details  Name: Lawrence [name] MRN: [id number] Date of Birth: Nov 17, 1955  Transition of Care [facility]) CM/SW Contact:    Erenest Rasher, RN Phone Number: [phone] 06/09/2021, 3:03 PM  Clinical Narrative:                 HF TOC CM spoke to pt and states he is active with [facility]. States his SO, [name] assist him in the home. He has aide through [facility] for 13 hours per week (4 days per week). Has hospital bed, rollator, and bedside commode. Contacted [facility] rep, Stacie. States pt has not completed all his visits. They will continue to follow for [facility]. Will need resumption of care orders for [facility], and PT with [facility]. Agency rep wanted to see if palliative care consult would benefit patient this admission. Referral sent to [facility]. Attending updated.     Expected Discharge Plan: [place] Barriers to Discharge: Continued Medical Work up   Patient Goals and CMS Choice Patient states their goals for this hospitalization and ongoing recovery are:: wants to remain independent CMS Medicare.gov Compare Post Acute Care list provided to:: Patient Choice offered to / list presented to : Patient  Expected Discharge Plan and Services Expected Discharge Plan: [place] In-house Referral: Clinical Social Work Discharge Planning Services: CM Consult Post Acute Care Choice: [place] arrangements for the past 2 months: [place] Arranged: RN, PT HH Agency: [place] Date [place]: 06/09/21 Time [place]: 1502 Representative spoke with at [place]: Freddi Starr  Prior Living Arrangements/Services Living arrangements for the past 2 months: [place] with:: Significant Other Patient language and need for interpreter reviewed:: Yes Do you feel safe going back to the place where you live?: Yes       Need for Family Participation in Patient Care: Yes (Comment) Care giver support system in place?: Yes (comment) Current home services: DME (rollator, bedside commode, hospital bed) Criminal Activity/Legal Involvement Pertinent to Current Situation/Hospitalization: No - Comment as needed  Activities of Daily Living      Permission Sought/Granted Permission sought to share information with : Case Manager, Family Supports, PCP Permission granted to share information with : Yes, Verbal Permission Granted  Share Information with NAME: Crista Curb  Permission granted to share info w AGENCY: [place] granted to share info w Relationship: significant other  Permission granted to share info w Contact Information: [phone]  Emotional Assessment   Attitude/Demeanor/Rapport: Gracious, Engaged Affect (typically observed): Accepting Orientation: : Oriented to Self, Oriented to Place, Oriented to  Time, Oriented to Situation   Psych Involvement: No (comment)  Admission diagnosis:  Peripheral edema [R60.9] CHF exacerbation ([place]) [I50.9] Chest pain, unspecified type [R07.9] Acute on chronic congestive heart failure, unspecified heart failure type [facility]) [I50.9] Patient Active Problem List   Diagnosis Date Noted   CHF exacerbation ([place]) 06/08/2021   Stroke-like symptoms 04/27/2021   Ambulatory dysfunction 04/27/2021   Left-sided weakness 02/09/2021   Atrial fibrillation with rapid ventricular response ([place]) 02/08/2021   Acute left-sided weakness 01/25/2021   AF (paroxysmal atrial fibrillation) ([place]) 01/25/2021   COPD (chronic obstructive pulmonary disease) ([place]) 01/25/2021   Moderate aortic stenosis 01/21/2021   Acquired dilation of  ascending aorta and aortic root ([place]) 01/21/2021   Coronary artery disease due to lipid rich plaque    SVT (supraventricular tachycardia) (HCC)    Partial seizure (HCC)    Acute CHF (congestive heart failure) ([place]) 10/17/2020   Essential  hypertension 10/17/2020   Hypokalemia 10/17/2020   Cellulitis of right leg 12/04/2019   Pain due to onychomycosis of toenails of both feet 09/11/2019   Diabetic neuropathy ([place]) 09/11/2019   Type 2 diabetes mellitus with diabetic polyneuropathy, with long-term current use of insulin ([place]) 09/11/2019   Sinus tachycardia 03/07/2019   Chronic venous stasis 03/07/2019   History of anemia 12/26/2018   Anemia 08/24/2018   History of colon polyps 08/24/2018   Do not resuscitate status 08/24/2018   Chronic diastolic CHF (congestive heart failure) ([place]) 08/18/2018   Bilateral lower leg cellulitis    Coronary artery calcification seen on CAT scan    Pure hypercholesterolemia    Chest pain    Shortness of breath    Acute respiratory failure with hypoxia ([place]) 01/13/2018   Right foot pain 01/13/2018   Acute on chronic diastolic CHF (congestive heart failure) ([place]) 01/13/2018   Diabetes mellitus type 2 in obese ([place]) 01/13/2018   New onset type 2 diabetes mellitus ([place]) 11/08/2017   Lower extremity cellulitis 11/07/2017   Primary osteoarthritis, left shoulder 03/05/2017   Essential hypertension, benign 02/22/2017   BPH without obstruction/lower urinary tract symptoms 02/22/2017   Hematuria 02/22/2017   Peripheral neuropathy 02/22/2017   Physical deconditioning 02/22/2017   Morbid obesity ([place])    PTSD (post-traumatic stress disorder)    Anasarca 01/31/2017   Bilateral lower extremity edema 01/31/2017   OSA (obstructive sleep apnea)    PCP:  [name], PA-C Pharmacy:   [facility] - [place], [place] - [address] [place] [address] Phone: [phone] Fax: [phone]     Social Determinants of Health (SDOH) Interventions Food Insecurity Interventions: Other (Comment) (HF TOC team.) Financial Strain Interventions: Other (Comment) (HF TOC team) Housing Interventions: Intervention Not Indicated Transportation  Interventions: [facility]  Readmission Risk Interventions Readmission Risk Prevention Plan 02/11/2021 12/06/2019  Transportation Screening Complete Complete  PCP or Specialist Appt within 5-7 Days - Complete  Home Care Screening - Complete  Medication Review (RN CM) - Complete  Medication Review (RN Care Manager) Complete -  [place] or Home Care Consult Complete -  SW Recovery Care/Counseling Consult Complete -  Palliative Care Screening Not Applicable -  [place] Not Applicable -  Some recent data might be hidden

## 2021-06-09 NOTE — Discharge Instructions (Signed)

## 2021-06-09 NOTE — Progress Notes (Signed)
Spoke to pharmacy about missing med dose of repaglinide due at 1630. Attempted twice and spoke to a pharmacy representatives at 1500 & 1830. Will notify oncoming RN.

## 2021-06-09 NOTE — Progress Notes (Signed)
Ok to optimize ceftriaxone to cefadroxil 1g PO BID x 3 more days to complete 5d of therapy per Dr. Eliseo Squires.  Onnie Boer, PharmD, BCIDP, AAHIVP, CPP Infectious Disease Pharmacist 06/09/2021 9:50 AM

## 2021-06-09 NOTE — Consult Note (Signed)
   [facility] Inpatient Consult   06/09/2021  [name] 29-Mar-1956 [id number]  [facility] [ACO] Patient: Lawrence [name], on high risk list  Primary Care Provider:  Vaughan Browner Physician at [place]: [place] Medicare started 06/08/21 per patient and noted in e-verified, not listed with current [facility] roster  Met with patient at bedside.  Patient states he is active with [facility] at [place] office for meds and delivery.  States his new benefits with [employer]. Patient states he doesn't use the [employer] except for his annual visits to keep in network.  Patient may not be on [place] Management until 30 days new roster list.  Patient verbalizes good follow up with primary care providers and specialist.  For questions, please contact:  Lawrence Brood, RN BSN [facility] Liaison  [phone] business mobile phone Toll free office [phone]  Fax number: [phone] [name]

## 2021-06-09 NOTE — Telephone Encounter (Signed)
Lawrence Age, MD  06/03/2021  8:16 AM EDT     Patient referred by Dr. April Manson, seen by me on 05/04/21, patient had a HST on 05/24/2021 for reevaluation of his obstructive sleep apnea.  He has been on BiPAP therapy.  Please remind patient that I would like to review his compliance data still since he did not bring his machine for the appointment in September.  Please remind him to bring by his BiPAP machine with a power cord so I can review the data from it. Please advise him that I will write for a new AutoPap machine, if he is eligible for new equipment.  Please also advise him that his sleep apnea is severe and his oxygen saturations are low at baseline, below 90% with an average of 87%, lowest of 73%.  This indicates significant oxygen desaturations.  I would advise him to seek evaluation through a pulmonologist for underlying lung disease.  His history less COPD as one of his diagnoses.  He may qualify for supplemental oxygen.  I do not see that he is followed by pulmonology through [facility], he may have another specialist outside of [facility].  Please advise patient to talk to his primary care provider about a referral to pulmonology if need be.   Please advise patient that once he gets his new AutoPap machine, he will need to be seen in follow-up within 3 months.  We may adjust his pressure depending on his current settings on his machine and his compliance report.   Please ask him if he has a preference for his DME company.  I believe he got his last machine through the [place].

## 2021-06-10 DIAGNOSIS — J449 Chronic obstructive pulmonary disease, unspecified: Secondary | ICD-10-CM | POA: Diagnosis not present

## 2021-06-10 DIAGNOSIS — L03119 Cellulitis of unspecified part of limb: Secondary | ICD-10-CM | POA: Diagnosis not present

## 2021-06-10 DIAGNOSIS — G4733 Obstructive sleep apnea (adult) (pediatric): Secondary | ICD-10-CM | POA: Diagnosis not present

## 2021-06-10 DIAGNOSIS — I509 Heart failure, unspecified: Secondary | ICD-10-CM

## 2021-06-10 DIAGNOSIS — I10 Essential (primary) hypertension: Secondary | ICD-10-CM | POA: Diagnosis not present

## 2021-06-10 DIAGNOSIS — I5033 Acute on chronic diastolic (congestive) heart failure: Secondary | ICD-10-CM | POA: Diagnosis not present

## 2021-06-10 LAB — GLUCOSE, CAPILLARY
Glucose-Capillary: 115 mg/dL — ABNORMAL HIGH (ref 70–99)
Glucose-Capillary: 109 mg/dL — ABNORMAL HIGH (ref 70–99)
Glucose-Capillary: 103 mg/dL — ABNORMAL HIGH (ref 70–99)
Glucose-Capillary: 137 mg/dL — ABNORMAL HIGH (ref 70–99)
Glucose-Capillary: 120 mg/dL — ABNORMAL HIGH (ref 70–99)

## 2021-06-10 MED ORDER — CEFADROXIL 500 MG PO CAPS: 1000.0000 mg | ORAL_CAPSULE | Freq: Two times a day (BID) | ORAL | 0 refills | Status: DC

## 2021-06-10 MED ORDER — TORSEMIDE 20 MG PO TABS
40.0000 mg | ORAL_TABLET | Freq: Two times a day (BID) | ORAL | Status: DC
  Administered 2021-06-10: 40 mg via ORAL
  Filled 2021-06-10: qty 2

## 2021-06-10 MED ORDER — TORSEMIDE 20 MG PO TABS
40.0000 mg | ORAL_TABLET | Freq: Two times a day (BID) | ORAL | Status: DC
Start: 2021-06-10 — End: 2021-07-05

## 2021-06-10 NOTE — Discharge Summary (Signed)
Physician Discharge Summary  Lawrence [name] WER:[id number] DOB: 1956-06-08 DOA: 06/07/2021  PCP: [name], PA-C  Admit date: 06/07/2021 Discharge date: 06/10/2021  Admitted From: home Discharge disposition: home   Recommendations for Outpatient Follow-Up:   Consider palliative care referral Fluid restrict Daily weights Home health Cards follow for AS   Discharge Diagnosis:   Principal Problem:   CHF exacerbation ([place]) Active Problems:   OSA (obstructive sleep apnea)   Essential hypertension, benign   Lower extremity cellulitis   COPD (chronic obstructive pulmonary disease) ([place])    Discharge Condition: Improved.  Diet recommendation: Low sodium, heart healthy  Wound care: None.  Code status: DNR   History of Present Illness:   Lawrence [name] is a 65 y.o. male with medical history significant of chronic diastolic CHF, paroxysmal A. fib on Eliquis, moderate aortic stenosis, COPD, morbid obesity (BMI 47.38), seizure disorder, insulin-dependent type 2 diabetes, hypertension, hyperlipidemia, OSA on CPAP, chronic pain, BPH, mood disorder, gout, seizure disorder, GERD presents to the ED via EMS for evaluation of chest pain, shortness of breath, and bilateral lower extremity edema.  Oxygen saturation in the 80s with EMS, initially placed on nonrebreather and then 4 L supplemental oxygen.  Vital signs stable on arrival to the ED, satting 94% on room air.  Labs notable for WBC 11.0.  Creatinine 0.8.  High-sensitivity troponin negative x2.  BNP normal.  Chest x-ray showing bronchitic changes with bibasilar atelectasis. Patient was given IV Lasix 40 mg for diuresis.   Patient reports 2-week history of progressively worsening dyspnea on exertion, orthopnea, and bilateral lower extremity edema.  He felt that his symptoms were due to A. fib as he has a heart monitor at home which showed A. fib 3 times.  Tonight he experienced left-sided chest pain at rest which he  describes as "someone thumping you in the chest with a finger."  Chest pain has now resolved and his breathing has improved significantly after he received Lasix in the emergency room.  Patient states he watches his dietary sodium and fluid intake.  Limits fluid intake to less than 1.5 L/day.  He cooks his own meals and does not eat too much salt.  He takes spironolactone and torsemide, has not missed any doses of any of his home medications.  Also takes Eliquis regularly and has not missed any doses.  Reports bilateral lower extremity erythema.  Denies history of blood clots.  No other complaints.   Hospital Course by Problem:   acute on chronic diastolic CHF Patient here with complaints of progressively worsening dyspnea on exertion, orthopnea, and bilateral lower extremity edema x2 weeks.  Echo done done in June 2022 showing normal systolic function and grade 2 diastolic dysfunction.  Also showing moderate aortic stenosis which could also be contributing to his symptoms.   - appear volume overloaded on exam with rales and bilateral lower extremity edema.  Difficult to assess for JVD given his body habitus.   -BNP likely falsely low in the setting of morbid obesity.   - reports improvement of symptoms after receiving IV Lasix 40 mg in the ED.   -change back to higher dose of demadex -Continue home metoprolol and [place].   -Low-sodium diet with fluid restriction.   - dd imer negative  -patient thinks his normal weight is 375lbs   Chest pain ACS less likely as high-sensitivity troponin negative x2.  Nuclear medicine stress test done in April 2022 was low risk.  Chest pain has resolved and  appears comfortable. -Continue to monitor   Possible bilateral lower extremity cellulitis -change to PO abx for short course   Paroxysmal A. Fib Current rhythm is sinus arrhythmia and rate normal. -Continue Eliquis, Cardizem, metoprolol   Moderate aortic stenosis Per echo done June 2022. -Repeat  echocardiogram ordered   COPD Stable.  No wheezing. -DuoNeb as needed   Insulin-dependent type 2 diabetes A1c 7.8 on 04/27/2021. -Continue home basal insulin and Farxiga.  Order sliding scale insulin sensitive ACHS.   Hypertension Stable. -Continue Cardizem, Imdur, metoprolol, prazosin   Hyperlipidemia -Continue Lipitor   OSA -Continue nightly CPAP   Chronic pain -Continue home oxycodone, Lyrica   Mood disorder -Continue Abilify, BuSpar, venlafaxine   Gout -Continue allopurinol   GERD -Continue Protonix   Morbid obesity Estimated body mass index is 47.47 kg/m as calculated from the following:   Height as of this encounter: $RemoveBeforeD'6\' 2"'WfaDpbmAStrfEf$  (1.88 m).   Weight as of this encounter: 167.7 kg.     Medical Consultants:      Discharge Exam:   Vitals:   06/10/21 0403 06/10/21 1158  BP: 113/88 133/83  Pulse: (!) 58 81  Resp: 16   Temp: 97.6 F (36.4 C) 97.9 F (36.6 C)  SpO2: 92% 97%   Vitals:   06/09/21 1943 06/10/21 0020 06/10/21 0403 06/10/21 1158  BP: 109/65  113/88 133/83  Pulse: 86  (!) 58 81  Resp: $Remo'16 20 16   'RXHXn$ Temp: 98.5 F (36.9 C)  97.6 F (36.4 C) 97.9 F (36.6 C)  TempSrc: Oral  Oral Oral  SpO2: 93%  92% 97%  Weight:   (!) 167.7 kg   Height:        General exam: Appears calm and comfortable.    The results of significant diagnostics from this hospitalization (including imaging, microbiology, ancillary and laboratory) are listed below for reference.     Procedures and Diagnostic Studies:   DG Chest 2 View  Result Date: 06/07/2021 CLINICAL DATA:  Chest pain and shortness of breath for several days, CHF, COPD, coronary artery disease, diabetes mellitus, former smoker EXAM: CHEST - 2 VIEW COMPARISON:  04/27/2021 FINDINGS: Upper normal heart size. Mediastinal contours and pulmonary vascularity normal. Bronchitic changes with bibasilar atelectasis. Lungs otherwise clear. No pulmonary infiltrate, pleural effusion, or pneumothorax. Scattered endplate  spur formation thoracic spine. IMPRESSION: Bronchitic changes with bibasilar atelectasis. Electronically Signed   By: Lavonia Dana M.D.   On: 06/07/2021 15:45   ECHOCARDIOGRAM COMPLETE  Result Date: 06/08/2021    ECHOCARDIOGRAM REPORT   Patient Name:   Lawrence [name] Date of Exam: 06/08/2021 Medical Rec #:  [id number]      Height:       74.0 in Accession #:    [id number]     Weight:       369.0 lb Date of Birth:  Jan 30, 1956      BSA:          2.820 m Patient Age:    43 years       BP:           117/68 mmHg Patient Gender: M              HR:           89 bpm. Exam Location:  Inpatient Procedure: 2D Echo, Cardiac Doppler and Color Doppler Indications:    D17.61 Acute diastolic (congestive) heart failure  History:        Patient has prior history of Echocardiogram examinations, most  recent 01/26/2021. CHF, COPD; Risk Factors:Hypertension.  Sonographer:    Glo Herring Referring Phys: [id number] [place]  1. Left ventricular ejection fraction, by estimation, is 60 to 65%. The left ventricle has normal function. The left ventricle has no regional wall motion abnormalities. The left ventricular internal cavity size was mildly dilated. There is mild concentric left ventricular hypertrophy. Left ventricular diastolic parameters were normal.  2. Right ventricular systolic function is normal. The right ventricular size is normal.  3. The mitral valve is normal in structure. Trivial mitral valve regurgitation.  4. AV is thickened, calcified Difficult to see well Peak and mean gradients through the valve arer 46 and 22 mm Hg respectively Dimensionless index is 0.27 consistent with moderate AS. Compared to echo from June 2022, mild increase in mean gradient (19 to 22 mm HG). The aortic valve is tricuspid. Aortic valve regurgitation is not visualized. FINDINGS  Left Ventricle: Left ventricular ejection fraction, by estimation, is 60 to 65%. The left ventricle has normal function. The left  ventricle has no regional wall motion abnormalities. The left ventricular internal cavity size was mildly dilated. There is  mild concentric left ventricular hypertrophy. Left ventricular diastolic parameters were normal. Right Ventricle: The right ventricular size is normal. Right vetricular wall thickness was not assessed. Right ventricular systolic function is normal. Left Atrium: Left atrial size was normal in size. Right Atrium: Right atrial size was normal in size. Pericardium: There is no evidence of pericardial effusion. Mitral Valve: The mitral valve is normal in structure. Mild mitral annular calcification. Trivial mitral valve regurgitation. Tricuspid Valve: The tricuspid valve is grossly normal. Tricuspid valve regurgitation is mild. Aortic Valve: AV is thickened, calcified Difficult to see well Peak and mean gradients through the valve arer 46 and 22 mm Hg respectively Dimensionless index is 0.27 consistent with moderate AS. Compared to echo from June 2022, mild increase in mean gradient (19 to 22 mm HG). The aortic valve is tricuspid. Aortic valve regurgitation is not visualized. Aortic valve mean gradient measures 20.2 mmHg. Aortic valve peak gradient measures 41.1 mmHg. Pulmonic Valve: The pulmonic valve was normal in structure. Pulmonic valve regurgitation is not visualized. Aorta: The aortic root and ascending aorta are structurally normal, with no evidence of dilitation. IAS/Shunts: No atrial level shunt detected by color flow Doppler.  LEFT VENTRICLE PLAX 2D LVIDd:         5.60 cm Diastology LVIDs:         3.70 cm LV e' medial:    8.38 cm/s LV PW:         1.30 cm LV E/e' medial:  8.0 LV IVS:        1.30 cm LV e' lateral:   5.44 cm/s                        LV E/e' lateral: 12.3  RIGHT VENTRICLE RV Basal diam:  4.30 cm RV S prime:     20.10 cm/s LEFT ATRIUM             Index        RIGHT ATRIUM           Index LA diam:        4.40 cm 1.56 cm/m   RA Area:     24.60 cm LA Vol (A2C):   68.4 ml 24.26  ml/m  RA Volume:   70.30 ml  24.93 ml/m LA Vol (A4C):   67.6 ml 23.97 ml/m  LA Biplane Vol: 69.5 ml 24.65 ml/m  AORTIC VALVE                    PULMONIC VALVE AV Vmax:           320.40 cm/s  PV Vmax:       1.00 m/s AV Vmean:          204.200 cm/s PV Peak grad:  4.0 mmHg AV VTI:            0.592 m AV Peak Grad:      41.1 mmHg AV Mean Grad:      20.2 mmHg LVOT Vmax:         87.03 cm/s LVOT Vmean:        61.667 cm/s LVOT VTI:          0.162 m LVOT/AV VTI ratio: 0.27  AORTA Ao Root diam: 3.90 cm Ao Asc diam:  3.70 cm MITRAL VALVE MV Area (PHT): 3.68 cm    SHUNTS MV Decel Time: 206 msec    Systemic VTI: 0.16 m MV E velocity: 66.80 cm/s MV A velocity: 49.30 cm/s MV E/A ratio:  1.35 Dorris Carnes MD Electronically signed by Dorris Carnes MD Signature Date/Time: 06/08/2021/5:00:21 PM    Final      Labs:   Basic Metabolic Panel: Recent Labs  Lab 06/07/21 1521 06/09/21 0754  NA 138 135  K 3.6 3.7  CL 97* 98  CO2 28 29  GLUCOSE 113* 146*  BUN 18 12  CREATININE 0.88 0.89  CALCIUM 9.5 9.1   GFR Estimated Creatinine Clearance: 138.1 mL/min (by C-G formula based on SCr of 0.89 mg/dL). Liver Function Tests: No results for input(s): AST, ALT, ALKPHOS, BILITOT, PROT, ALBUMIN in the last 168 hours. No results for input(s): LIPASE, AMYLASE in the last 168 hours. No results for input(s): AMMONIA in the last 168 hours. Coagulation profile No results for input(s): INR, PROTIME in the last 168 hours.  CBC: Recent Labs  Lab 06/07/21 1521 06/08/21 0500 06/09/21 0754  WBC 11.0* 10.6* 10.6*  HGB 13.8 13.6 13.7  HCT 44.3 41.9 42.3  MCV 93.1 90.7 90.8  PLT 295 264 259   Cardiac Enzymes: No results for input(s): CKTOTAL, CKMB, CKMBINDEX, TROPONINI in the last 168 hours. BNP: Invalid input(s): POCBNP CBG: Recent Labs  Lab 06/09/21 1646 06/09/21 2106 06/10/21 0549 06/10/21 1120 06/10/21 1200  GLUCAP 120* 111* 115* 109* 103*   D-Dimer Recent Labs    06/08/21 0149  DDIMER 0.38   Hgb A1c No  results for input(s): HGBA1C in the last 72 hours. Lipid Profile No results for input(s): CHOL, HDL, LDLCALC, TRIG, CHOLHDL, LDLDIRECT in the last 72 hours. Thyroid function studies No results for input(s): TSH, T4TOTAL, T3FREE, THYROIDAB in the last 72 hours.  Invalid input(s): FREET3 Anemia work up No results for input(s): VITAMINB12, FOLATE, FERRITIN, TIBC, IRON, RETICCTPCT in the last 72 hours. Microbiology Recent Results (from the past 240 hour(s))  Resp Panel by RT-PCR (Flu A&B, Covid) Nasopharyngeal Swab     Status: None   Collection Time: 06/08/21  5:34 AM   Specimen: Nasopharyngeal Swab; Nasopharyngeal(NP) swabs in vial transport medium  Result Value Ref Range Status   SARS Coronavirus 2 by RT PCR NEGATIVE NEGATIVE Final    Comment: (NOTE) SARS-CoV-2 target nucleic acids are NOT DETECTED.  The SARS-CoV-2 RNA is generally detectable in upper respiratory specimens during the acute phase of infection. The lowest concentration of SARS-CoV-2 viral copies this assay can  detect is 138 copies/mL. A negative result does not preclude SARS-Cov-2 infection and should not be used as the sole basis for treatment or other patient management decisions. A negative result may occur with  improper specimen collection/handling, submission of specimen other than nasopharyngeal swab, presence of viral mutation(s) within the areas targeted by this assay, and inadequate number of viral copies(<138 copies/mL). A negative result must be combined with clinical observations, patient history, and epidemiological information. The expected result is Negative.  Fact Sheet for Patients:  EntrepreneurPulse.com.au  Fact Sheet for Healthcare Providers:  IncredibleEmployment.be  This test is no t yet approved or cleared by the [place] FDA and  has been authorized for detection and/or diagnosis of SARS-CoV-2 by FDA under an Emergency Use Authorization (EUA). This  EUA will remain  in effect (meaning this test can be used) for the duration of the COVID-19 declaration under Section 564(b)(1) of the Act, 21 U.S.C.section 360bbb-3(b)(1), unless the authorization is terminated  or revoked sooner.       Influenza A by PCR NEGATIVE NEGATIVE Final   Influenza B by PCR NEGATIVE NEGATIVE Final    Comment: (NOTE) The Xpert Xpress SARS-CoV-2/FLU/RSV plus assay is intended as an aid in the diagnosis of influenza from Nasopharyngeal swab specimens and should not be used as a sole basis for treatment. Nasal washings and aspirates are unacceptable for Xpert Xpress SARS-CoV-2/FLU/RSV testing.  Fact Sheet for Patients: EntrepreneurPulse.com.au  Fact Sheet for Healthcare Providers: IncredibleEmployment.be  This test is not yet approved or cleared by the [place] FDA and has been authorized for detection and/or diagnosis of SARS-CoV-2 by FDA under an Emergency Use Authorization (EUA). This EUA will remain in effect (meaning this test can be used) for the duration of the COVID-19 declaration under Section 564(b)(1) of the Act, 21 U.S.C. section 360bbb-3(b)(1), unless the authorization is terminated or revoked.  Performed at [facility], [place] [address], [place],  [address]      Discharge Instructions:   Discharge Instructions     (HEART FAILURE PATIENTS) Call MD:  Anytime you have any of the following symptoms: 1) 3 pound weight gain in 24 hours or 5 pounds in 1 week 2) shortness of breath, with or without a dry hacking cough 3) swelling in the hands, feet or stomach 4) if you have to sleep on extra pillows at night in order to breathe.   Complete by: As directed    Diet - low sodium heart healthy   Complete by: As directed    Discharge instructions   Complete by: As directed    Monitor fluid intake BMP 1 week   Heart Failure patients record your daily weight using the same scale at the same time  of day   Complete by: As directed    Increase activity slowly   Complete by: As directed       Allergies as of 06/10/2021       Reactions   Vancomycin Other (See Comments)   "Red Man Syndrome" 02/02/17: possible cause for rash under both arms   Niacin Other (See Comments)   Niacin And Related Other (See Comments)   Red man syndrome   Tubersol [tuberculin] Other (See Comments)   Reaction unknown   Doxycycline Rash, Other (See Comments)        Medication List     TAKE these medications    Accu-Chek FastClix Lancets Misc 1 each by Other route in the morning and at bedtime.   acetaminophen  325 MG tablet Commonly known as: TYLENOL Take 2 tablets (650 mg total) by mouth every 4 (four) hours as needed for mild pain (or temp > 37.5 C (99.5 F)).   albuterol 108 (90 Base) MCG/ACT inhaler Commonly known as: VENTOLIN HFA Inhale 2 puffs into the lungs every 6 (six) hours as needed for wheezing or shortness of breath.   Alcohol Prep 70 % Pads 1 each by Other route in the morning, at noon, and at bedtime.   allopurinol 100 MG tablet Commonly known as: ZYLOPRIM Take 200 mg by mouth daily.   apixaban 5 MG Tabs tablet Commonly known as: ELIQUIS Take 1 tablet (5 mg total) by mouth 2 (two) times daily.   ARIPiprazole 5 MG tablet Commonly known as: ABILIFY TAKE ONE TABLET BY MOUTH EVERYDAY AT BEDTIME What changed: See the new instructions.   atorvastatin 40 MG tablet Commonly known as: LIPITOR TAKE ONE TABLET BY MOUTH DAILY AT 6PM What changed:  how much to take how to take this when to take this   B-D ULTRAFINE III SHORT PEN 31G X 8 MM Misc Generic drug: Insulin Pen Needle 1 each by Other route See admin instructions. Use to inject insulin subcutaneously at bedtime as directed.   blood glucose meter kit and supplies Dispense based on patient and insurance preference. Use up to four times daily as directed. (FOR ICD-10 E10.9, E11.9).   busPIRone 5 MG tablet Commonly  known as: BUSPAR TAKE ONE TABLET BY MOUTH THREE TIMES DAILY   cefadroxil 500 MG capsule Commonly known as: DURICEF Take 2 capsules (1,000 mg total) by mouth 2 (two) times daily.   cetirizine 10 MG tablet Commonly known as: ZYRTEC Take 10 mg by mouth at bedtime.   cyanocobalamin 1000 MCG tablet Take 1 tablet (1,000 mcg total) by mouth daily.   dapagliflozin propanediol 10 MG Tabs tablet Commonly known as: Farxiga Take 1 tablet (10 mg total) by mouth daily before breakfast.   diltiazem 180 MG 24 hr capsule Commonly known as: CARDIZEM CD TAKE ONE CAPSULE BY MOUTH ONCE DAILY   ferrous sulfate 324 (65 Fe) MG Tbec Take 324 mg by mouth daily with breakfast.   fluticasone 50 MCG/ACT nasal spray Commonly known as: FLONASE Place 2 sprays into both nostrils daily.   isosorbide mononitrate 30 MG 24 hr tablet Commonly known as: IMDUR Take 1 tablet (30 mg total) by mouth daily.   Lantus SoloStar 100 UNIT/ML Solostar Pen Generic drug: insulin glargine Inject 5 Units into the skin every morning.   metFORMIN 1000 MG tablet Commonly known as: GLUCOPHAGE Take 1,000 mg by mouth 2 (two) times daily with a meal.   metoprolol succinate 50 MG 24 hr tablet Commonly known as: TOPROL-XL Take 1 tablet (50 mg total) by mouth daily. Take with or immediately following a meal.   omeprazole 20 MG capsule Commonly known as: PRILOSEC Take 20 mg by mouth every morning.   Oxycodone HCl 10 MG Tabs Take 10 mg by mouth every 8 (eight) hours.   potassium chloride SA 20 MEQ tablet Commonly known as: KLOR-CON Take 2 tablets (40 mEq total) by mouth daily.   prazosin 2 MG capsule Commonly known as: MINIPRESS Take 2 capsules (4 mg total) by mouth at bedtime.   pregabalin 50 MG capsule Commonly known as: LYRICA Take 1 capsule (50 mg total) by mouth 3 (three) times daily. What changed: additional instructions   pregabalin 100 MG capsule Commonly known as: LYRICA TAKE ONE CAPSULE BY MOUTH THREE  TIMES DAILY What changed: additional instructions   repaglinide 2 MG tablet Commonly known as: PRANDIN Take 1 tablet (2 mg total) by mouth 2 (two) times daily before a meal.   spironolactone 25 MG tablet Commonly known as: ALDACTONE Take 1 tablet (25 mg total) by mouth daily.   temazepam 15 MG capsule Commonly known as: RESTORIL TAKE ONE CAPSULE BY MOUTH EVERYDAY AT BEDTIME What changed: See the new instructions.   torsemide 20 MG tablet Commonly known as: Demadex Take 2 tablets (40 mg total) by mouth 2 (two) times daily.   traZODone 100 MG tablet Commonly known as: DESYREL Take 2 tablets (200 mg total) by mouth at bedtime.   Trulicity 4.5 XG/5.2VH Sopn Generic drug: Dulaglutide Inject 4.5 mg as directed once a week. What changed: when to take this   venlafaxine XR 75 MG 24 hr capsule Commonly known as: EFFEXOR-XR Take 3 capsules (225 mg total) by mouth daily with breakfast.   VITAMIN D3 PO Take 2,000 Units by mouth daily.        Follow-up Information     [facility] [facility] Follow up.   Specialty: Home Health Services Why: Home Health RN, Physical Therapy-agency will call to arrange visits Contact information: [place] [address] [phone]         [name], PA-C. Go on 06/21/2021.   Specialty: Family Medicine Why: $RemoveBefor'@1'wduotqCKlMEQ$ :30pm Contact information: [place] [address] [phone]         Sueanne Margarita, MD .   Specialty: Cardiology Contact information: [address] [place] [address] [phone]                  Time coordinating discharge: 35 min  Signed:  Geradine Girt DO  [facility] 06/10/2021, 1:57 PM

## 2021-06-10 NOTE — Progress Notes (Signed)
Heart Failure Nurse Navigator Progress Note  Follow up visit with patient. Pt states he is breathing easier and feel like his abd has gone down some.   Potential DC today. No immediate SDOH needs noted. Offered HV TOC appt for next week, pt states he does not have availability next week. Pt has previously established appt with primary cardiology with Dr. Radford Pax on 11/17. Confirmed with patient he will be attending that appt.   Navigator arranged transportation with wheelchair/scooter Lucianne Lei via [facility].   Pricilla Holm, MSN, RN Heart Failure Nurse Navigator [phone]

## 2021-06-10 NOTE — TOC Transition Note (Signed)
Transition of Care [facility]) - CM/SW Discharge Note   Patient Details  Name: [name] MRN: [id number] Date of Birth: 10-31-1955  Transition of Care [facility]) CM/SW Contact:  Zenon Mayo, RN Phone Number: 06/10/2021, 2:16 PM   Clinical Narrative:    Patient is for dc today, he is set up with [facility] by previous NCM.  He will need ptar for transport.  [facility] has been called. Stacie with [facility] notified of dc.    Final next level of care: [place] Barriers to Discharge: No Barriers Identified   Patient Goals and CMS Choice Patient states their goals for this hospitalization and ongoing recovery are:: wants to remain independent CMS Medicare.gov Compare Post Acute Care list provided to:: Patient Choice offered to / list presented to : Patient  Discharge Placement                       Discharge Plan and Services In-house Referral: Clinical Social Work Discharge Planning Services: CM Consult Post Acute Care Choice: Home Health                    HH Arranged: RN, PT [facility] Agency: [place] Date [place]: 06/09/21 Time [place]: 1502 Representative spoke with at [place]: [place] (SDOH) Interventions Food Insecurity Interventions: Other (Comment) (HF TOC team.) Financial Strain Interventions: Other (Comment) (HF TOC team) Housing Interventions: Intervention Not Indicated Transportation Interventions: [facility]   Readmission Risk Interventions Readmission Risk Prevention Plan 02/11/2021 12/06/2019  Transportation Screening Complete Complete  PCP or Specialist Appt within 5-7 Days - Complete  Home Care Screening - Complete  Medication Review (RN CM) - Complete  Medication Review (RN Care Manager) Complete -  [place] or Home Care Consult Complete -  SW Recovery Care/Counseling Consult Complete -  Palliative Care Screening Not Applicable -   [place] Not Applicable -  Some recent data might be hidden

## 2021-06-10 NOTE — Patient Outreach (Signed)
Mr. Espin will be followed by [facility] Management.

## 2021-06-16 ENCOUNTER — Other Ambulatory Visit: Payer: Self-pay | Admitting: Endocrinology

## 2021-06-16 ENCOUNTER — Encounter: Payer: Self-pay | Admitting: Endocrinology

## 2021-06-16 MED ORDER — METFORMIN HCL ER 500 MG PO TB24: 2000.0000 mg | ORAL_TABLET | Freq: Every day | ORAL | 3 refills | Status: DC

## 2021-06-18 ENCOUNTER — Encounter: Payer: Self-pay | Admitting: Endocrinology

## 2021-06-21 ENCOUNTER — Telehealth: Payer: Self-pay | Admitting: Cardiology

## 2021-06-21 DIAGNOSIS — E1169 Type 2 diabetes mellitus with other specified complication: Secondary | ICD-10-CM | POA: Diagnosis not present

## 2021-06-21 DIAGNOSIS — E559 Vitamin D deficiency, unspecified: Secondary | ICD-10-CM | POA: Diagnosis not present

## 2021-06-21 DIAGNOSIS — M1A00X0 Idiopathic chronic gout, unspecified site, without tophus (tophi): Secondary | ICD-10-CM | POA: Diagnosis not present

## 2021-06-21 DIAGNOSIS — R6889 Other general symptoms and signs: Secondary | ICD-10-CM | POA: Diagnosis not present

## 2021-06-21 DIAGNOSIS — I10 Essential (primary) hypertension: Secondary | ICD-10-CM | POA: Diagnosis not present

## 2021-06-21 NOTE — Telephone Encounter (Signed)
Spoke with Melissa at [facility]. Confirmed patient should be taking torsemide 40 mg BID. She states that the patient is going to need a refill soon. I advised her that the patient is scheduled to see Dr. Radford Pax on Thursday 11/16 and we will wait to send in refill until appointment in case Dr. Radford Pax makes any changes.

## 2021-06-21 NOTE — Telephone Encounter (Signed)
Pt c/o medication issue:  1. Name of Medication: torsemide (DEMADEX) 20 MG tablet  2. How are you currently taking this medication (dosage and times per day)? Take 2 tablets (40 mg total) by mouth 2 (two) times daily  3. Are you having a reaction (difficulty breathing--STAT)? no  4. What is your medication issue? Called to say that when the patient was discharge for the hospital they increase his medication to 80mg . 4 tables twice day. Office wanted to make dr turne aware and need a call back to confirm

## 2021-06-23 ENCOUNTER — Other Ambulatory Visit: Payer: Self-pay | Admitting: Adult Health

## 2021-06-23 DIAGNOSIS — E1169 Type 2 diabetes mellitus with other specified complication: Secondary | ICD-10-CM | POA: Diagnosis not present

## 2021-06-23 DIAGNOSIS — J449 Chronic obstructive pulmonary disease, unspecified: Secondary | ICD-10-CM | POA: Diagnosis not present

## 2021-06-23 DIAGNOSIS — I10 Essential (primary) hypertension: Secondary | ICD-10-CM | POA: Diagnosis not present

## 2021-06-23 DIAGNOSIS — G4700 Insomnia, unspecified: Secondary | ICD-10-CM | POA: Diagnosis not present

## 2021-06-23 DIAGNOSIS — E785 Hyperlipidemia, unspecified: Secondary | ICD-10-CM | POA: Diagnosis not present

## 2021-06-23 DIAGNOSIS — E1165 Type 2 diabetes mellitus with hyperglycemia: Secondary | ICD-10-CM | POA: Diagnosis not present

## 2021-06-23 DIAGNOSIS — F331 Major depressive disorder, recurrent, moderate: Secondary | ICD-10-CM | POA: Diagnosis not present

## 2021-06-23 DIAGNOSIS — G459 Transient cerebral ischemic attack, unspecified: Secondary | ICD-10-CM | POA: Diagnosis not present

## 2021-06-24 ENCOUNTER — Other Ambulatory Visit: Payer: Self-pay

## 2021-06-24 ENCOUNTER — Encounter: Payer: Self-pay | Admitting: Cardiology

## 2021-06-24 ENCOUNTER — Ambulatory Visit (INDEPENDENT_AMBULATORY_CARE_PROVIDER_SITE_OTHER): Payer: Medicare HMO | Admitting: Cardiology

## 2021-06-24 VITALS — BP 120/80 | HR 92 | Ht 74.0 in | Wt 383.0 lb

## 2021-06-24 DIAGNOSIS — I2510 Atherosclerotic heart disease of native coronary artery without angina pectoris: Secondary | ICD-10-CM

## 2021-06-24 DIAGNOSIS — I10 Essential (primary) hypertension: Secondary | ICD-10-CM | POA: Diagnosis not present

## 2021-06-24 DIAGNOSIS — I2583 Coronary atherosclerosis due to lipid rich plaque: Secondary | ICD-10-CM

## 2021-06-24 DIAGNOSIS — E7800 Pure hypercholesterolemia, unspecified: Secondary | ICD-10-CM

## 2021-06-24 DIAGNOSIS — I480 Paroxysmal atrial fibrillation: Secondary | ICD-10-CM

## 2021-06-24 DIAGNOSIS — R0602 Shortness of breath: Secondary | ICD-10-CM | POA: Diagnosis not present

## 2021-06-24 DIAGNOSIS — I5032 Chronic diastolic (congestive) heart failure: Secondary | ICD-10-CM | POA: Diagnosis not present

## 2021-06-24 MED ORDER — ATORVASTATIN CALCIUM 40 MG PO TABS: 40.0000 mg | ORAL_TABLET | Freq: Every evening | ORAL | 3 refills | Status: DC

## 2021-06-24 MED ORDER — DILTIAZEM HCL ER COATED BEADS 180 MG PO CP24: 180.0000 mg | ORAL_CAPSULE | Freq: Every day | ORAL | 3 refills | Status: DC

## 2021-06-24 MED ORDER — APIXABAN 5 MG PO TABS: 5.0000 mg | ORAL_TABLET | Freq: Two times a day (BID) | ORAL | 2 refills | Status: DC

## 2021-06-24 MED ORDER — ISOSORBIDE MONONITRATE ER 30 MG PO TB24: 30.0000 mg | ORAL_TABLET | Freq: Every day | ORAL | 2 refills | Status: DC

## 2021-06-24 MED ORDER — METOPROLOL SUCCINATE ER 50 MG PO TB24: 50.0000 mg | ORAL_TABLET | Freq: Every day | ORAL | 2 refills | Status: DC

## 2021-06-24 MED ORDER — SPIRONOLACTONE 25 MG PO TABS: 25.0000 mg | ORAL_TABLET | Freq: Every day | ORAL | 2 refills | Status: DC

## 2021-06-24 NOTE — Patient Instructions (Signed)
Medication Instructions:  Your physician recommends that you continue on your current medications as directed. Please refer to the Current Medication list given to you today.  *If you need a refill on your cardiac medications before your next appointment, please call your pharmacy*   Follow-Up: At [facility], you and your health needs are our priority.  As part of our continuing mission to provide you with exceptional heart care, we have created designated Provider Care Teams.  These Care Teams include your primary Cardiologist (physician) and Advanced Practice Providers (APPs -  Physician Assistants and Nurse Practitioners) who all work together to provide you with the care you need, when you need it.  Your next appointment:   6 month(s)  The format for your next appointment:   In Person  Provider:   Fransico Him, MD     Other Instructions You have been referred to the [place] have been referred to see a pulmonologist.

## 2021-06-24 NOTE — Progress Notes (Signed)
Cardiology Office Note:    Date:  06/24/2021   ID:  Lawrence [name], DOB 1956/01/15, MRN [id number]  PCP:  [name], PA-C  Cardiologist:  Fransico Him, MD    Referring MD: [name], PA-C   Chief Complaint  Patient presents with   Coronary Artery Disease   Hypertension   Hyperlipidemia   Atrial Fibrillation   Chest Pain   Congestive Heart Failure     History of Present Illness:    Lawrence [name] is a 65 y.o. male with a hx of nonobstructive coronary artery disease, hypertension, hyperlipidemia, moderate AS, chronic diastolic heart failure, OSA on BiPAP, super morbid obesity, SVT, depression, anxiety.  He is a DNR.  He was seen in the hospital 01/25/2018 for evaluation of chest pain and shortness of breath.  Left heart catheterization was subsequently performed which showed minimal coronary artery disease.    He is followed by EP for PAF and is on Eliquis.  His last heart monitor showed an afib burden of 1%.  Nuclear stress test in the past done for chest pain showed no ischemia.  Some of his chest pain in the past has been felt to be related to CP anxiety and emotional stress and started on Imdur and Buspar and that has improved his CP.  2D echo 06/08/2021 showed normal LVF and diastolic function with moderate AS.  He was recently hospitalized 06/07/2021 with acute on chronic diastolic CHF with CP, SOB and LE edema and hypoxemia.  BNP was normal and Cxray showed bronchitic changes with atelectasis.  He was diuresed with Lasix.  He complained of CP that felt like someone thumping on his chest with a finger. He was changed to higher dose of Demadex and continued on Lopressor and [place].  He thinks his normal weight is around 375lbs. He was treated for LE cellulitis.    He is here today for followup and is doing well.  He still occasionally get some CP related to emotional stress.  He has chronic DOE related to morbid obesity and sedentary state. He has problems with  awakening gasping for breath even when using his BiPAP.  He has chronic LE edema that he thinks has improved. He denies any palpitations or syncope. He is compliant with his meds and is tolerating meds with no SE.     Past Medical History:  Diagnosis Date   Acquired dilation of ascending aorta and aortic root ([place])    28m by echo 01/2021   Adenomatous colon polyp 2007   Anemia    Anxiety    Aortic stenosis    moderate AS by echo 01/2021   Asthma    BPH without obstruction/lower urinary tract symptoms 02/22/2017   Chronic diastolic (congestive) heart failure (HCC)    Chronic venous stasis 03/07/2019   COPD (chronic obstructive pulmonary disease) (HCC)    Coronary artery calcification seen on CAT scan    Depression    Diabetic neuropathy ([facility] 09/11/2019   History of colon polyps 08/24/2018   Hypertension    Morbid obesity (HCC)    OSA (obstructive sleep apnea)    Pain due to onychomycosis of toenails of both feet 09/11/2019   Peripheral neuropathy 02/22/2017   Primary osteoarthritis, left shoulder 03/05/2017   PTSD (post-traumatic stress disorder)    Pure hypercholesterolemia    QT prolongation 03/07/2019   Seizures (HCC)    Shortness of breath    Sinus tachycardia 03/07/2019   Sleep apnea    CPAP   Type 2  diabetes mellitus with vascular disease ([place]) 09/11/2019    Past Surgical History:  Procedure Laterality Date   ENDOVENOUS ABLATION SAPHENOUS VEIN W/ LASER Right 08/20/2020   endovenous laser ablation right greater saphenous vein by Gae Gallop MD    JOINT REPLACEMENT     left knee replacement x 2   KNEE ARTHROSCOPY Bilateral    LEFT HEART CATH AND CORONARY ANGIOGRAPHY N/A 01/17/2018   Procedure: LEFT HEART CATH AND CORONARY ANGIOGRAPHY;  Surgeon: Leonie Man, MD;  Location: [facility];  Service: Cardiovascular;  Laterality: N/A;   UMBILICAL HERNIA REPAIR      Current Medications: Current Meds  Medication Sig   Accu-Chek FastClix Lancets MISC 1 each  by Other route in the morning and at bedtime.   acetaminophen (TYLENOL) 325 MG tablet Take 2 tablets (650 mg total) by mouth every 4 (four) hours as needed for mild pain (or temp > 37.5 C (99.5 F)).   albuterol (VENTOLIN HFA) 108 (90 Base) MCG/ACT inhaler Inhale 2 puffs into the lungs every 6 (six) hours as needed for wheezing or shortness of breath.   Alcohol Swabs (ALCOHOL PREP) 70 % PADS 1 each by Other route in the morning, at noon, and at bedtime.   allopurinol (ZYLOPRIM) 100 MG tablet Take 200 mg by mouth daily.   apixaban (ELIQUIS) 5 MG TABS tablet Take 1 tablet (5 mg total) by mouth 2 (two) times daily.   ARIPiprazole (ABILIFY) 5 MG tablet TAKE ONE TABLET BY MOUTH EVERYDAY AT BEDTIME (Patient taking differently: Take 5 mg by mouth at bedtime.)   atorvastatin (LIPITOR) 40 MG tablet TAKE ONE TABLET BY MOUTH DAILY AT 6PM (Patient taking differently: Take 40 mg by mouth every evening. TAKE ONE TABLET BY MOUTH DAILY AT 6PM)   B-D ULTRAFINE III SHORT PEN 31G X 8 MM MISC 1 each by Other route See admin instructions. Use to inject insulin subcutaneously at bedtime as directed.   blood glucose meter kit and supplies Dispense based on patient and insurance preference. Use up to four times daily as directed. (FOR ICD-10 E10.9, E11.9).   busPIRone (BUSPAR) 5 MG tablet TAKE ONE TABLET BY MOUTH THREE TIMES DAILY (Patient taking differently: Take 5 mg by mouth 3 (three) times daily.)   cetirizine (ZYRTEC) 10 MG tablet Take 10 mg by mouth at bedtime.    Cholecalciferol (VITAMIN D3 PO) Take 2,000 Units by mouth daily.   dapagliflozin propanediol (FARXIGA) 10 MG TABS tablet Take 1 tablet (10 mg total) by mouth daily before breakfast.   diltiazem (CARDIZEM CD) 180 MG 24 hr capsule TAKE ONE CAPSULE BY MOUTH ONCE DAILY (Patient taking differently: Take 180 mg by mouth daily.)   Dulaglutide (TRULICITY) 4.5 XM/1.4JW SOPN Inject 4.5 mg as directed once a week. (Patient taking differently: Inject 4.5 mg as directed  every Monday.)   ferrous sulfate 324 (65 Fe) MG TBEC Take 324 mg by mouth daily with breakfast.    fluticasone (FLONASE) 50 MCG/ACT nasal spray Place 2 sprays into both nostrils daily.    insulin glargine (LANTUS SOLOSTAR) 100 UNIT/ML Solostar Pen Inject 5 Units into the skin every morning.   isosorbide mononitrate (IMDUR) 30 MG 24 hr tablet Take 1 tablet (30 mg total) by mouth daily.   metFORMIN (GLUCOPHAGE-XR) 500 MG 24 hr tablet Take 4 tablets (2,000 mg total) by mouth daily.   metoprolol succinate (TOPROL-XL) 50 MG 24 hr tablet Take 1 tablet (50 mg total) by mouth daily. Take with or immediately following a  meal.   omeprazole (PRILOSEC) 20 MG capsule Take 20 mg by mouth every morning.   Oxycodone HCl 10 MG TABS Take 10 mg by mouth every 8 (eight) hours.   potassium chloride SA (KLOR-CON) 20 MEQ tablet Take 2 tablets (40 mEq total) by mouth daily.   prazosin (MINIPRESS) 2 MG capsule Take 2 capsules (4 mg total) by mouth at bedtime.   pregabalin (LYRICA) 100 MG capsule TAKE ONE CAPSULE BY MOUTH THREE TIMES DAILY (Patient taking differently: Take 100 mg by mouth 3 (three) times daily. Take along with 50 mg capsules)   pregabalin (LYRICA) 50 MG capsule Take 1 capsule (50 mg total) by mouth 3 (three) times daily. (Patient taking differently: Take 50 mg by mouth 3 (three) times daily. Along with 100 mg capsules)   repaglinide (PRANDIN) 2 MG tablet Take 1 tablet (2 mg total) by mouth 2 (two) times daily before a meal.   spironolactone (ALDACTONE) 25 MG tablet Take 1 tablet (25 mg total) by mouth daily.   temazepam (RESTORIL) 15 MG capsule TAKE ONE CAPSULE BY MOUTH EVERYDAY AT BEDTIME   torsemide (DEMADEX) 20 MG tablet Take 2 tablets (40 mg total) by mouth 2 (two) times daily.   traZODone (DESYREL) 100 MG tablet Take 2 tablets (200 mg total) by mouth at bedtime.   venlafaxine XR (EFFEXOR-XR) 75 MG 24 hr capsule Take 3 capsules (225 mg total) by mouth daily with breakfast.   vitamin B-12 1000 MCG  tablet Take 1 tablet (1,000 mcg total) by mouth daily.     Allergies:   Vancomycin, Niacin, Niacin and related, Tubersol [tuberculin], and Doxycycline   Social History   Socioeconomic History   Marital status: Significant Other    Spouse name: [name]   Number of children: 1   Years of education: Not on file   Highest education level: Associate degree: academic program  Occupational History   Occupation: retired    Comment: Artist  Tobacco Use   Smoking status: Former    Packs/day: 1.00    Years: 44.00    Pack years: 44.00    Types: Cigarettes    Quit date: 03/07/2016    Years since quitting: 5.3    Passive exposure: Past   Smokeless tobacco: Never  Vaping Use   Vaping Use: Never used  Substance and Sexual Activity   Alcohol use: Yes    Comment: rare   Drug use: No   Sexual activity: Not on file  Other Topics Concern   Not on file  Social History Narrative   Lives with wife [name]   Right Handed   Drinks 1-2 cups caffeine   Social Determinants of Health   Financial Resource Strain: Medium Risk   Difficulty of Paying Living Expenses: Somewhat hard  Food Insecurity: Food Insecurity Present   Worried About Charity fundraiser in the Last Year: Sometimes true   Ran Out of Food in the Last Year: Never true  Transportation Needs: Unmet Transportation Needs   Lack of Transportation (Medical): Yes   Lack of Transportation (Non-Medical): No  Physical Activity: Not on file  Stress: Not on file  Social Connections: Not on file     Family History: The patient's family history includes CAD in his maternal grandfather; Diabetes in an other family member. There is no history of Diabetes Mellitus II, Colon cancer, Esophageal cancer, Inflammatory bowel disease, Liver disease, Pancreatic cancer, Rectal cancer, Stomach cancer, or Sleep apnea.  ROS:   Please see the history  of present illness.    ROS  All other systems reviewed and negative.   EKGs/Labs/Other  Studies Reviewed:    The following studies were reviewed today: PAP compliance download from [place], outside labs   EKG:  EKG is ordered today and showed NSR with PAC and low voltage QRS, inferior infarct  Recent Labs: 02/09/2021: TSH 1.442 02/11/2021: Magnesium 2.2 04/27/2021: ALT 31 06/07/2021: B Natriuretic Peptide 41.8 06/09/2021: BUN 12; Creatinine, Ser 0.89; Hemoglobin 13.7; Platelets 259; Potassium 3.7; Sodium 135   Recent Lipid Panel    Component Value Date/Time   CHOL 148 04/28/2021 0218   TRIG 246 (H) 04/28/2021 0218   HDL 30 (L) 04/28/2021 0218   CHOLHDL 4.9 04/28/2021 0218   VLDL 49 (H) 04/28/2021 0218   LDLCALC 69 04/28/2021 0218    Physical Exam:    VS:  BP 120/80   Pulse 92   Ht _0  (1.88 m)   Wt (!) 383 lb (173.7 kg)   SpO2 92%   BMI 49.17 kg/m     Wt Readings from Last 3 Encounters:  06/24/21 (!) 383 lb (173.7 kg)  06/10/21 (!) 369 lb 11.2 oz (167.7 kg)  05/04/21 (!) 385 lb (174.6 kg)    GEN: Well nourished, well developed in no acute distress HEENT: Normal NECK: No JVD; No carotid bruits LYMPHATICS: No lymphadenopathy CARDIAC:RRR, no murmurs, rubs, gallops RESPIRATORY:  Clear to auscultation without rales, wheezing or rhonchi  ABDOMEN: Soft, non-tender, non-distended MUSCULOSKELETAL:  No edema; No deformity  SKIN: Warm and dry NEUROLOGIC:  Alert and oriented x 3 PSYCHIATRIC:  Normal affect    ASSESSMENT:    1. PAF (paroxysmal atrial fibrillation) ([place])   2. Coronary artery disease due to lipid rich plaque   3. Essential hypertension   4. Pure hypercholesterolemia   5. Chronic diastolic CHF (congestive heart failure) ([place])   6. Shortness of breath    PLAN:    In order of problems listed above:  1.  SVT/PAF -followed by EP  -Continues to maintain normal sinus rhythm on exam today with minimal palpitations -event monitor showed multiple episodes of PAF/PAT but afib burden 1% -now on DOAC with no complaints of bleeding -Continue  prescription drug management with Eliquis 5 mg twice daily and Toprol-XL 25 mg daily with as needed refills -Hbg 13.7 on 06/09/2021  2.  ASCAD -minimal coronary artery disease by cath 2019 -He has chronic chest pain related to anxiety and emotional stress which has not changed -Lexiscan myoivew in April 2022 showed no ischemia -continue ASA, BB and statin  3.  HTN -BP is adequately controlled on exam today -Continue prescription drug management with prazosin 4 mg nightly, lisinopril 20 mg daily, spironolactone 25 mg daily, Toprol-XL 25 mg daily with as needed refil  4.  HLD -LDL goal < 70 -I have personally reviewed and interpreted outside labs performed by patient's PCP which showed LDL 69, HDL 30, triglycerides 246, ALT 31 on 04/27/2021 -Continue prescription drug management with atorvastatin 40 mg daily with as needed refills  5.  Chronic diastolic CHF -volume status if very difficult to assess in setting of morbid obesity -He has chronic lower extremity edema from his morbid obesity and chronic venous insufficiency which appear to be stable -continue prescription drug management with Demadex 40 mg BID as well as Farxiga 10 mg daily with as needed refills -I have personally reviewed and interpreted outside labs performed by patient's PCP which showed serum creatinine 0.85 and potassium 4.8 on 06/21/2021 -continue  BiPAP -I suspect that he may have obesity/hypoventilation syndrome>>refer to Pulmonary -he continues to have exacerbations of CHF likely exacerbated by his supra morbid obesity >>refer to AHF clinic  Followup with me in 6 months  Medication Adjustments/Labs and Tests Ordered: Current medicines are reviewed at length with the patient today.  Concerns regarding medicines are outlined above.  Orders Placed This Encounter  Procedures   AMB referral to CHF clinic   Ambulatory referral to Pulmonology    No orders of the defined types were placed in this  encounter.   Signed, Fransico Him, MD  06/24/2021 9:20 AM    [place]

## 2021-06-25 ENCOUNTER — Other Ambulatory Visit: Payer: Self-pay | Admitting: Adult Health

## 2021-06-25 ENCOUNTER — Telehealth: Payer: Self-pay | Admitting: *Deleted

## 2021-06-25 NOTE — Telephone Encounter (Signed)
   Pre-operative Risk Assessment    Patient Name: Lawrence [name]  DOB: 14-Nov-1955 MRN: [id number]      Request for Surgical Clearance   Procedure:   [place]   Date of Surgery: Clearance TBD                                 Surgeon:  DR. Sherlyn Lees Surgeon's Group or Practice Name:  [facility] Phone number:  [phone] Fax number:  [phone]   Type of Clearance Requested: - Medical  - Pharmacy:  Hold Apixaban (Eliquis) x 3 DAYS PRIOR TO PROCEDURE   Type of Anesthesia:   Not Indicated   Additional requests/questions:   Jiles Prows   06/25/2021, 4:54 PM

## 2021-06-28 NOTE — Telephone Encounter (Signed)
Patient with diagnosis of afib on Eliquis for anticoagulation.    Procedure: spinal cord stimulator trial Date of procedure: TBD  CHA2DS2-VASc Score = 4  This indicates a 4.8% annual risk of stroke. The patient's score is based upon: CHF History: 1 HTN History: 1 Diabetes History: 1 Stroke History: 0 Vascular Disease History: 1 Age Score: 0 Gender Score: 0  CrCl >119mL/min Platelet count 259K  Per office protocol, patient can hold Eliquis for 3 days prior to procedure.

## 2021-06-29 NOTE — Addendum Note (Signed)
Addended by: Antonieta Iba on: 06/29/2021 07:37 AM   Modules accepted: Orders

## 2021-06-30 ENCOUNTER — Ambulatory Visit (INDEPENDENT_AMBULATORY_CARE_PROVIDER_SITE_OTHER): Payer: Medicare HMO | Admitting: Vascular Surgery

## 2021-06-30 ENCOUNTER — Encounter: Payer: Self-pay | Admitting: Vascular Surgery

## 2021-06-30 ENCOUNTER — Other Ambulatory Visit: Payer: Self-pay

## 2021-06-30 ENCOUNTER — Telehealth: Payer: Self-pay | Admitting: Cardiology

## 2021-06-30 VITALS — BP 107/65 | HR 90 | Temp 98.1°F | Resp 18 | Ht 74.0 in | Wt 375.0 lb

## 2021-06-30 DIAGNOSIS — L97219 Non-pressure chronic ulcer of right calf with unspecified severity: Secondary | ICD-10-CM | POA: Diagnosis not present

## 2021-06-30 DIAGNOSIS — I872 Venous insufficiency (chronic) (peripheral): Secondary | ICD-10-CM | POA: Diagnosis not present

## 2021-06-30 DIAGNOSIS — I83012 Varicose veins of right lower extremity with ulcer of calf: Secondary | ICD-10-CM

## 2021-06-30 NOTE — Telephone Encounter (Signed)
Have [facility] who wants to talk with triage in regards to patient medication torsemide (DEMADEX) 20 MG tablet

## 2021-06-30 NOTE — Progress Notes (Signed)
REASON FOR VISIT:   Follow-up of chronic venous insufficiency  MEDICAL ISSUES:   CHRONIC VENOUS INSUFFICIENCY: This patient has undergone successful laser ablation of the right small saphenous vein.  He does have some deep venous reflux on the right.  On the left side he has no significant superficial venous reflux but does have deep venous reflux.  His wounds on the right leg have healed.  I have encouraged him to continue to elevate his legs daily.  He is somewhat limited by his congestive heart failure as he has a hard time lying flat.  We have discussed the importance of exercise.  We have again discussed the importance of maintaining a healthy weight.  We also discussed water aerobics which I think would be very helpful for him.  He is having a hard time finding a lift which can handle his weight and I suggested checking with [facility] as they have some supervised physical therapy in the pool.  I plan on seeing him back in 1 year and at that time we will get a venous reflux study on the left to see if things have changed.  I also instructed him to keep his skin well lubricated.  He knows to call sooner if he has problems.  HPI:   Lawrence [name] is a pleasant 65 y.o. male who I last saw on 08/27/2020.  He has C6 venous disease.  He does not have any evidence of arterial insufficiency.  He underwent endovenous laser ablation of the right great saphenous vein on 08/20/2020.  When I saw him last the wounds on his right leg were improving.  Duplex at that time showed no evidence of DVT on the right and successful closure of the right great saphenous vein.  He had some superficial ulcerations on the left.  Previous duplex scan on the left showed deep venous reflux but no significant superficial venous reflux.  When we saw him last, we discussed the importance of continued daily leg elevation and compression therapy.  We discussed the importance of exercise and trying to maintain a healthy weight.  He  comes in for his 32-month follow-up visit.  Overall he is doing well.  The wounds on his right leg have healed.  He has no significant wounds on his left leg.  His activity is fairly limited because of his congestive heart failure and obesity.  He really has not had significant pain in his legs recently.  Past Medical History:  Diagnosis Date   Acquired dilation of ascending aorta and aortic root ([place])    36mm by echo 01/2021   Adenomatous colon polyp 2007   Anemia    Anxiety    Aortic stenosis    moderate AS by echo 01/2021   Asthma    BPH without obstruction/lower urinary tract symptoms 02/22/2017   Chronic diastolic (congestive) heart failure (HCC)    Chronic venous stasis 03/07/2019   COPD (chronic obstructive pulmonary disease) (HCC)    Coronary artery calcification seen on CAT scan    Depression    Diabetic neuropathy ([place]) 09/11/2019   History of colon polyps 08/24/2018   Hypertension    Morbid obesity (HCC)    OSA (obstructive sleep apnea)    Pain due to onychomycosis of toenails of both feet 09/11/2019   Peripheral neuropathy 02/22/2017   Primary osteoarthritis, left shoulder 03/05/2017   PTSD (post-traumatic stress disorder)    Pure hypercholesterolemia    QT prolongation 03/07/2019   Seizures ([place])  Shortness of breath    Sinus tachycardia 03/07/2019   Sleep apnea    CPAP   Type 2 diabetes mellitus with vascular disease ([place]) 09/11/2019    Family History  Problem Relation Age of Onset   CAD Maternal Grandfather    Diabetes Other    Diabetes Mellitus II Neg Hx    Colon cancer Neg Hx    Esophageal cancer Neg Hx    Inflammatory bowel disease Neg Hx    Liver disease Neg Hx    Pancreatic cancer Neg Hx    Rectal cancer Neg Hx    Stomach cancer Neg Hx    Sleep apnea Neg Hx     SOCIAL HISTORY: Social History   Tobacco Use   Smoking status: Former    Packs/day: 1.00    Years: 44.00    Pack years: 44.00    Types: Cigarettes    Quit date: 03/07/2016     Years since quitting: 5.3    Passive exposure: Past   Smokeless tobacco: Never  Substance Use Topics   Alcohol use: Yes    Comment: rare    Allergies  Allergen Reactions   Vancomycin Other (See Comments)    "Red Man Syndrome" 02/02/17: possible cause for rash under both arms   Niacin Other (See Comments)   Niacin And Related Other (See Comments)    Red man syndrome   Tubersol [Tuberculin] Other (See Comments)    Reaction unknown   Doxycycline Rash and Other (See Comments)    Current Outpatient Medications  Medication Sig Dispense Refill   Accu-Chek FastClix Lancets MISC 1 each by Other route in the morning and at bedtime.     acetaminophen (TYLENOL) 325 MG tablet Take 2 tablets (650 mg total) by mouth every 4 (four) hours as needed for mild pain (or temp > 37.5 C (99.5 F)).     albuterol (VENTOLIN HFA) 108 (90 Base) MCG/ACT inhaler Inhale 2 puffs into the lungs every 6 (six) hours as needed for wheezing or shortness of breath.     Alcohol Swabs (ALCOHOL PREP) 70 % PADS 1 each by Other route in the morning, at noon, and at bedtime.     allopurinol (ZYLOPRIM) 100 MG tablet Take 200 mg by mouth daily.     apixaban (ELIQUIS) 5 MG TABS tablet Take 1 tablet (5 mg total) by mouth 2 (two) times daily. 180 tablet 2   ARIPiprazole (ABILIFY) 5 MG tablet TAKE ONE TABLET BY MOUTH EVERYDAY AT BEDTIME 30 tablet 1   atorvastatin (LIPITOR) 40 MG tablet Take 1 tablet (40 mg total) by mouth every evening. TAKE ONE TABLET BY MOUTH DAILY AT 6PM 90 tablet 3   B-D ULTRAFINE III SHORT PEN 31G X 8 MM MISC 1 each by Other route See admin instructions. Use to inject insulin subcutaneously at bedtime as directed.     blood glucose meter kit and supplies Dispense based on patient and insurance preference. Use up to four times daily as directed. (FOR ICD-10 E10.9, E11.9). 1 each 0   busPIRone (BUSPAR) 5 MG tablet TAKE ONE TABLET BY MOUTH THREE TIMES DAILY (Patient taking differently: Take 5 mg by mouth 3 (three)  times daily.) 270 tablet 1   cetirizine (ZYRTEC) 10 MG tablet Take 10 mg by mouth at bedtime.      Cholecalciferol (VITAMIN D3 PO) Take 2,000 Units by mouth daily.     dapagliflozin propanediol (FARXIGA) 10 MG TABS tablet Take 1 tablet (10 mg total) by mouth daily  before breakfast. 90 tablet 3   diltiazem (CARDIZEM CD) 180 MG 24 hr capsule Take 1 capsule (180 mg total) by mouth daily. 90 capsule 3   Dulaglutide (TRULICITY) 4.5 YY/5.0PT SOPN Inject 4.5 mg as directed once a week. (Patient taking differently: Inject 4.5 mg as directed every Monday.) 4.5 mL 3   ferrous sulfate 324 (65 Fe) MG TBEC Take 324 mg by mouth daily with breakfast.      fluticasone (FLONASE) 50 MCG/ACT nasal spray Place 2 sprays into both nostrils daily.      insulin glargine (LANTUS SOLOSTAR) 100 UNIT/ML Solostar Pen Inject 5 Units into the skin every morning. 30 mL 3   isosorbide mononitrate (IMDUR) 30 MG 24 hr tablet Take 1 tablet (30 mg total) by mouth daily. 90 tablet 2   metFORMIN (GLUCOPHAGE-XR) 500 MG 24 hr tablet Take 4 tablets (2,000 mg total) by mouth daily. 360 tablet 3   metoprolol succinate (TOPROL-XL) 50 MG 24 hr tablet Take 1 tablet (50 mg total) by mouth daily. Take with or immediately following a meal. 90 tablet 2   omeprazole (PRILOSEC) 20 MG capsule Take 20 mg by mouth every morning.     Oxycodone HCl 10 MG TABS Take 10 mg by mouth every 8 (eight) hours.     potassium chloride SA (KLOR-CON) 20 MEQ tablet Take 2 tablets (40 mEq total) by mouth daily. 180 tablet 2   prazosin (MINIPRESS) 2 MG capsule Take 2 capsules (4 mg total) by mouth at bedtime. 180 capsule 2   pregabalin (LYRICA) 100 MG capsule TAKE ONE CAPSULE BY MOUTH THREE TIMES DAILY (Patient taking differently: Take 100 mg by mouth 3 (three) times daily. Take along with 50 mg capsules) 270 capsule 0   pregabalin (LYRICA) 50 MG capsule Take 1 capsule (50 mg total) by mouth 3 (three) times daily. (Patient taking differently: Take 50 mg by mouth 3 (three)  times daily. Along with 100 mg capsules) 90 capsule 2   repaglinide (PRANDIN) 2 MG tablet Take 1 tablet (2 mg total) by mouth 2 (two) times daily before a meal. 180 tablet 3   spironolactone (ALDACTONE) 25 MG tablet Take 1 tablet (25 mg total) by mouth daily. 90 tablet 2   temazepam (RESTORIL) 15 MG capsule TAKE ONE CAPSULE BY MOUTH EVERYDAY AT BEDTIME 30 capsule 1   torsemide (DEMADEX) 20 MG tablet Take 2 tablets (40 mg total) by mouth 2 (two) times daily.     traZODone (DESYREL) 100 MG tablet Take 2 tablets (200 mg total) by mouth at bedtime. 180 tablet 3   venlafaxine XR (EFFEXOR-XR) 75 MG 24 hr capsule Take 3 capsules (225 mg total) by mouth daily with breakfast. 270 capsule 3   vitamin B-12 1000 MCG tablet Take 1 tablet (1,000 mcg total) by mouth daily.     cefadroxil (DURICEF) 500 MG capsule Take 2 capsules (1,000 mg total) by mouth 2 (two) times daily. (Patient not taking: Reported on 06/24/2021) 4 capsule 0   No current facility-administered medications for this visit.    REVIEW OF SYSTEMS:  $RemoveB'[X]'NRLcGMjp$  denotes positive finding, $RemoveBeforeDEI'[ ]'HTqvroxagGbVzJwo$  denotes negative finding Cardiac  Comments:  Chest pain or chest pressure:    Shortness of breath upon exertion:    Short of breath when lying flat:    Irregular heart rhythm:        Vascular    Pain in calf, thigh, or hip brought on by ambulation:    Pain in feet at night that wakes you up  from your sleep:     Blood clot in your veins:    Leg swelling:   x       Pulmonary    Oxygen at home:    Productive cough:     Wheezing:         Neurologic    Sudden weakness in arms or legs:     Sudden numbness in arms or legs:     Sudden onset of difficulty speaking or slurred speech:    Temporary loss of vision in one eye:     Problems with dizziness:         Gastrointestinal    Blood in stool:     Vomited blood:         Genitourinary    Burning when urinating:     Blood in urine:        Psychiatric    Major depression:         Hematologic     Bleeding problems:    Problems with blood clotting too easily:        Skin    Rashes or ulcers:        Constitutional    Fever or chills:     PHYSICAL EXAM:   Vitals:   06/30/21 1335  BP: 107/65  Pulse: 90  Resp: 18  Temp: 98.1 F (36.7 C)  TempSrc: Temporal  SpO2: 94%  Weight: (!) 375 lb (170.1 kg)  Height: $Remove'6\' 2"'sSfGxny$  (1.88 m)    GENERAL: The patient is a well-nourished male, in no acute distress. The vital signs are documented above. CARDIAC: There is a regular rate and rhythm.  VASCULAR: I do not detect carotid bruits. He has mild bilateral lower extremity swelling with hyperpigmentation bilaterally.  He has no open ulcers. PULMONARY: There is good air exchange bilaterally without wheezing or rales. ABDOMEN: Soft and non-tender with normal pitched bowel sounds.  MUSCULOSKELETAL: There are no major deformities or cyanosis. NEUROLOGIC: No focal weakness or paresthesias are detected. SKIN: There are no ulcers or rashes noted.       PSYCHIATRIC: The patient has a normal affect.  DATA:    No new data  [name] [facility] [phone]

## 2021-06-30 NOTE — Telephone Encounter (Signed)
Reviewed pt's chart and appears Eulogio Bear DO filled Torsemide at 40 mg bid and pharmacy made aware to contact this office with any questions ./cy

## 2021-07-05 ENCOUNTER — Encounter: Payer: Self-pay | Admitting: Cardiology

## 2021-07-05 MED ORDER — TORSEMIDE 20 MG PO TABS: 40.0000 mg | ORAL_TABLET | Freq: Two times a day (BID) | ORAL | 3 refills | Status: DC

## 2021-07-07 ENCOUNTER — Other Ambulatory Visit: Payer: Self-pay

## 2021-07-07 ENCOUNTER — Ambulatory Visit (INDEPENDENT_AMBULATORY_CARE_PROVIDER_SITE_OTHER): Payer: Medicare HMO | Admitting: Podiatry

## 2021-07-07 ENCOUNTER — Encounter: Payer: Self-pay | Admitting: Podiatry

## 2021-07-07 DIAGNOSIS — E1142 Type 2 diabetes mellitus with diabetic polyneuropathy: Secondary | ICD-10-CM

## 2021-07-07 DIAGNOSIS — M79674 Pain in right toe(s): Secondary | ICD-10-CM

## 2021-07-07 DIAGNOSIS — I878 Other specified disorders of veins: Secondary | ICD-10-CM

## 2021-07-07 DIAGNOSIS — B351 Tinea unguium: Secondary | ICD-10-CM

## 2021-07-07 DIAGNOSIS — L03032 Cellulitis of left toe: Secondary | ICD-10-CM | POA: Insufficient documentation

## 2021-07-07 DIAGNOSIS — M79675 Pain in left toe(s): Secondary | ICD-10-CM

## 2021-07-07 DIAGNOSIS — R6889 Other general symptoms and signs: Secondary | ICD-10-CM | POA: Diagnosis not present

## 2021-07-07 MED ORDER — CEPHALEXIN 500 MG PO CAPS: 500.0000 mg | ORAL_CAPSULE | Freq: Two times a day (BID) | ORAL | 0 refills | Status: AC

## 2021-07-07 NOTE — Progress Notes (Signed)
This patient returns to my office for at risk foot care.  This patient requires this care by a professional since this patient will be at risk due to having diabetic neuropathy and angiopathy and venous stasis.  . This patient presents to the office with his wife.This patient is unable to cut nails himself since the patient cannot reach his nails.These nails are painful walking and wearing shoes. He says he is experiencing pain and discomfort big toenail left foot.  Also pain fifth toenail right foot. This patient presents for at risk foot care today.  General Appearance  Alert, conversant and in no acute stress.  Vascular  Dorsalis pedis and posterior tibial  pulses are not  palpable  billaterally due to severe swelling feet.  Capillary return is within normal limits  bilaterally. Temperature is within normal limits  Bilaterally. Venous stasis right leg.  Neurologic  Senn-Weinstein monofilament wire test absent right foot.  Diminished left foot.. Muscle power within normal limits bilaterally.  Nails Thick disfigured discolored nails with subungual debris  from hallux to fifth toes bilaterally.  Pincer hallux nails   B/L. Blood exudes from under fifth toenail right foot.  His left hallux toenail is unattached from nail bed.  White necrotic tissue noted subungually left hallux nail.  Drainage noted subungually left hallux.  Orthopedic  No limitations of motion  feet .  No crepitus or effusions noted.  No bony pathology or digital deformities noted.  Severe HAV  B/L.  Skin  normotropic skin with no porokeratosis noted bilaterally.  No signs of infections or ulcers noted.     Onychomycosis  Pain in right toes  Pain in left toes  Paronychia left hallux.  Consent was obtained for treatment procedures.   Mechanical debridement of nails 1-5  bilaterally performed with a nail nipper.  Filed with dremel without incident.  Excision nail plate left hallux nail.  Neosporin/DSD applied.  Home soaks described and  rebandaging.  Call the office if this nail is not healing.  Cephalexin 500 mg  # 15 prescribed.   Return office visit   3 months                   Told patient to return for periodic foot care and evaluation due to potential at risk complications.   Gardiner Barefoot DPM

## 2021-07-08 ENCOUNTER — Other Ambulatory Visit: Payer: Self-pay | Admitting: Cardiology

## 2021-07-09 NOTE — Telephone Encounter (Signed)
Looks like Pharm-D was routing to pre op with their recommendations. I did not see it in the pre op pool. I have routed to pre op pool for final notes.

## 2021-07-10 ENCOUNTER — Other Ambulatory Visit: Payer: Self-pay | Admitting: Neurology

## 2021-07-10 DIAGNOSIS — M792 Neuralgia and neuritis, unspecified: Secondary | ICD-10-CM

## 2021-07-12 ENCOUNTER — Other Ambulatory Visit: Payer: Self-pay | Admitting: Neurology

## 2021-07-12 ENCOUNTER — Encounter: Payer: Self-pay | Admitting: *Deleted

## 2021-07-12 MED ORDER — PREGABALIN 150 MG PO CAPS: 150.0000 mg | ORAL_CAPSULE | Freq: Three times a day (TID) | ORAL | 1 refills | Status: DC

## 2021-07-12 NOTE — Telephone Encounter (Signed)
Left message for pt to call back  °

## 2021-07-12 NOTE — Telephone Encounter (Signed)
I spoke to the patient. He is currently doing well on pregabaline 150mg  TID. He is aware a new rx for 150mg  capsules will be sent to the pharmacy. He should take one cap TID. His last note stated to continue care with this PCP for his neuropathy. He is in agreement to ask them for further refills.  I spoke to [place] at [facility]. She has voided all other doses of pregabalin on file to avoid confusion. They will only fill the 150mg  capsules.

## 2021-07-12 NOTE — Telephone Encounter (Signed)
Dr. Aundra Dubin, pt to see you tomorrow  if you could comment on procedure attached, spinal cord stimulator for clearance. Send to [facility] preop pool thanks

## 2021-07-12 NOTE — Telephone Encounter (Signed)
[facility]) called, pt asking if Pregabalin can be sent for 150 mg instead of splitting the mg so would only have to take one tablet.  Would like a call from the nurse.

## 2021-07-13 ENCOUNTER — Other Ambulatory Visit: Payer: Self-pay | Admitting: Adult Health

## 2021-07-13 ENCOUNTER — Ambulatory Visit (HOSPITAL_COMMUNITY)
Admission: RE | Admit: 2021-07-13 | Discharge: 2021-07-13 | Disposition: A | Discharge status: Home / Self Care | Payer: Medicare HMO | Source: Ambulatory Visit | Attending: Cardiology | Admitting: Cardiology

## 2021-07-13 ENCOUNTER — Other Ambulatory Visit: Payer: Self-pay

## 2021-07-13 ENCOUNTER — Encounter (HOSPITAL_COMMUNITY): Payer: Self-pay | Admitting: Cardiology

## 2021-07-13 VITALS — BP 110/84 | HR 77 | Wt 386.0 lb

## 2021-07-13 DIAGNOSIS — I5032 Chronic diastolic (congestive) heart failure: Secondary | ICD-10-CM | POA: Insufficient documentation

## 2021-07-13 DIAGNOSIS — Z7901 Long term (current) use of anticoagulants: Secondary | ICD-10-CM | POA: Insufficient documentation

## 2021-07-13 DIAGNOSIS — I350 Nonrheumatic aortic (valve) stenosis: Secondary | ICD-10-CM | POA: Diagnosis not present

## 2021-07-13 DIAGNOSIS — I110 Hypertensive heart disease with heart failure: Secondary | ICD-10-CM | POA: Insufficient documentation

## 2021-07-13 DIAGNOSIS — Z87891 Personal history of nicotine dependence: Secondary | ICD-10-CM | POA: Insufficient documentation

## 2021-07-13 DIAGNOSIS — R6889 Other general symptoms and signs: Secondary | ICD-10-CM | POA: Diagnosis not present

## 2021-07-13 DIAGNOSIS — G4733 Obstructive sleep apnea (adult) (pediatric): Secondary | ICD-10-CM | POA: Insufficient documentation

## 2021-07-13 DIAGNOSIS — I480 Paroxysmal atrial fibrillation: Secondary | ICD-10-CM | POA: Insufficient documentation

## 2021-07-13 LAB — BASIC METABOLIC PANEL
Anion gap: 9 (ref 5–15)
BUN: 20 mg/dL (ref 8–23)
CO2: 28 mmol/L (ref 22–32)
Calcium: 8.7 mg/dL — ABNORMAL LOW (ref 8.9–10.3)
Chloride: 99 mmol/L (ref 98–111)
Creatinine, Ser: 0.96 mg/dL (ref 0.61–1.24)
GFR, Estimated: >60 mL/min (ref >60)
Glucose, Bld: 148 mg/dL — ABNORMAL HIGH (ref 70–99)
Potassium: 4.1 mmol/L (ref 3.5–5.1)
Sodium: 136 mmol/L (ref 135–145)

## 2021-07-13 LAB — BRAIN NATRIURETIC PEPTIDE: B Natriuretic Peptide: 34.3 pg/mL (ref 0.0–100.0)

## 2021-07-13 MED ORDER — POTASSIUM CHLORIDE CRYS ER 20 MEQ PO TBCR: EXTENDED_RELEASE_TABLET | ORAL | 3 refills | Status: DC

## 2021-07-13 MED ORDER — TORSEMIDE 20 MG PO TABS: ORAL_TABLET | ORAL | 3 refills | Status: DC

## 2021-07-13 NOTE — Patient Instructions (Signed)
EKG done today.  Labs done today. We will contact you only if your labs are abnormal.  INCREASE Torsemide to 60mg  (3 tablets) by mouth every morning and 40mg  (2 tablets) by mouth every evening.  INCREASE Potassium to 19meq(2 tablets) by mouth every morning and 70meq (1 tablet) by mouth every evening.   No other medication changes were made. Please continue all current medications as prescribed.  Your physician recommends that you schedule a follow-up appointment in: 1 month  If you have any questions or concerns before your next appointment please send [place] a message through [place] or call our office at [phone].    TO LEAVE A MESSAGE FOR THE NURSE SELECT OPTION 2, PLEASE LEAVE A MESSAGE INCLUDING: YOUR NAME DATE OF BIRTH CALL BACK NUMBER REASON FOR CALL**this is important as we prioritize the call backs  YOU WILL RECEIVE A CALL BACK THE SAME DAY AS LONG AS YOU CALL BEFORE 4:00 PM   Do the following things EVERYDAY: Weigh yourself in the morning before breakfast. Write it down and keep it in a log. Take your medicines as prescribed Eat low salt foods--Limit salt (sodium) to 2000 mg per day.  Stay as active as you can everyday Limit all fluids for the day to less than 2 liters   At the [facility], you and your health needs are our priority. As part of our continuing mission to provide you with exceptional heart care, we have created designated Provider Care Teams. These Care Teams include your primary Cardiologist (physician) and Advanced Practice Providers (APPs- Physician Assistants and Nurse Practitioners) who all work together to provide you with the care you need, when you need it.   You may see any of the following providers on your designated Care Team at your next follow up: Dr Glori Bickers Dr Haynes Kerns, NP Lyda Jester, [place] Audry Riles, PharmD   Please be sure to bring in all your medications bottles to every appointment.     You are scheduled for a Cardiac Catheterization & TEE on  December         2022 with Dr. Loralie Champagne.  1. Please arrive at the [facility] (Main Entrance A) at [facility]: [address] [place], [place] [address] at         (This time is two hours before your procedure to ensure your preparation). Free valet parking service is available.   Special note: Every effort is made to have your procedure done on time. Please understand that emergencies sometimes delay scheduled procedures.  2. Diet: Do not eat solid foods after midnight.  The patient may have clear liquids until 5am upon the day of the procedure.  4. Medication instructions in preparation for your procedure:  DO NOT TAKE your Eliquis the day before your procedure and the day of your procedure  Do not take Diabetes Med Glucophage (Metformin) on the day of the procedure and HOLD 48 HOURS AFTER THE PROCEDURE.  On the morning of your procedure DO NOT TAKE Torsemide.   On the morning of your procedure, take any morning medicines NOT listed above.  You may use sips of water.  5. Plan for one night stay--bring personal belongings. 6. Bring a current list of your medications and current insurance cards. 7. You MUST have a responsible person to drive you home. 8. Someone MUST be with you the first 24 hours after you arrive home or your discharge will be delayed. 9. Please  wear clothes that are easy to get on and off and wear slip-on shoes.

## 2021-07-13 NOTE — Progress Notes (Signed)
PCP: [name], PA-C Cardiology: Dr. Mayford Knife HF Cardiology: Dr. Shirlee Latch  65 y.o. with history of paroxysmal atrial fibrillation, OSA/?OHS, morbid obesity, and chronic diastolic CHF was referred by Dr. Mayford Knife for evaluation of CHF.  Patient has been admitted multiple times in the last year for CHF exacerbations.  He is short of breath after walking about 30 feet.  He uses a walker around the house and a motorized scooter outside the house.  He has chronic orthopnea.  He has chronic chest pain episodes, will feel tightness in his chest with no trigger.  This has been chronic and going on for years.  He had a negative cath in 2019 and a negative Cardiolite in 4/22.  He feels occasional palpitations, no lightheadedness. He is in NSR today.  Most recent echo in 11/22 was a difficult study but showed EF 60-65%, mild LVH, mildly dilated LV, moderate aortic stenosis (trileaflet aortic valve) with mean gradient 22 mmHg, DI 0.27.   ECG (personally reviewed): NSR, 1st degree AVB  Labs (11/22): K 3.7, creatinine 0.89  PMH: 1. Type 2 diabetes 2. Atrial fibrillation: Paroxysmal.  3. HTN 4. Hyperlipidemia 5. OSA, ?OHS: Uses Bipap.  6. Obesity 7. SVT 8. Chest pain: LHC in 6/19 with minimal CAD.  - Cardiolite (4/22): No ischemia.  9. Depression/anxiety.  10. Chronic diastolic CHF: Echo in 11/22 with EF 60-65%, mild LVH, mildly dilated LV, moderate aortic stenosis (trileaflet aortic valve) with mean gradient 22 mmHg, DI 0.27.   Social History   Socioeconomic History   Marital status: Significant Other    Spouse name: [name]   Number of children: 1   Years of education: Not on file   Highest education level: Associate degree: academic program  Occupational History   Occupation: retired    Comment: Building control surveyor  Tobacco Use   Smoking status: Former    Packs/day: 1.00    Years: 44.00    Pack years: 44.00    Types: Cigarettes    Quit date: 03/07/2016    Years since quitting: 5.3     Passive exposure: Past   Smokeless tobacco: Never  Vaping Use   Vaping Use: Never used  Substance and Sexual Activity   Alcohol use: Yes    Comment: rare   Drug use: No   Sexual activity: Not on file  Other Topics Concern   Not on file  Social History Narrative   Lives with wife [name]   Right Handed   Drinks 1-2 cups caffeine   Social Determinants of Health   Financial Resource Strain: Medium Risk   Difficulty of Paying Living Expenses: Somewhat hard  Food Insecurity: Food Insecurity Present   Worried About Programme researcher, broadcasting/film/video in the Last Year: Sometimes true   Ran Out of Food in the Last Year: Never true  Transportation Needs: Unmet Transportation Needs   Lack of Transportation (Medical): Yes   Lack of Transportation (Non-Medical): No  Physical Activity: Not on file  Stress: Not on file  Social Connections: Not on file  Intimate Partner Violence: Not on file   Family History  Problem Relation Age of Onset   CAD Maternal Grandfather    Diabetes Other    Diabetes Mellitus II Neg Hx    Colon cancer Neg Hx    Esophageal cancer Neg Hx    Inflammatory bowel disease Neg Hx    Liver disease Neg Hx    Pancreatic cancer Neg Hx    Rectal cancer Neg Hx  Stomach cancer Neg Hx    Sleep apnea Neg Hx    ROS: All systems reviewed and negative except as per HPI.   Current Outpatient Medications  Medication Sig Dispense Refill   Accu-Chek FastClix Lancets MISC 1 each by Other route in the morning and at bedtime.     acetaminophen (TYLENOL) 325 MG tablet Take 2 tablets (650 mg total) by mouth every 4 (four) hours as needed for mild pain (or temp > 37.5 C (99.5 F)).     albuterol (VENTOLIN HFA) 108 (90 Base) MCG/ACT inhaler Inhale 2 puffs into the lungs every 6 (six) hours as needed for wheezing or shortness of breath.     Alcohol Swabs (ALCOHOL PREP) 70 % PADS 1 each by Other route in the morning, at noon, and at bedtime.     allopurinol (ZYLOPRIM) 100 MG tablet Take 200 mg by  mouth daily.     apixaban (ELIQUIS) 5 MG TABS tablet Take 1 tablet (5 mg total) by mouth 2 (two) times daily. 180 tablet 2   atorvastatin (LIPITOR) 40 MG tablet Take 1 tablet (40 mg total) by mouth every evening. TAKE ONE TABLET BY MOUTH DAILY AT 6PM 90 tablet 3   B-D ULTRAFINE III SHORT PEN 31G X 8 MM MISC 1 each by Other route See admin instructions. Use to inject insulin subcutaneously at bedtime as directed.     blood glucose meter kit and supplies Dispense based on patient and insurance preference. Use up to four times daily as directed. (FOR ICD-10 E10.9, E11.9). 1 each 0   busPIRone (BUSPAR) 5 MG tablet TAKE ONE TABLET BY MOUTH THREE TIMES DAILY (Patient taking differently: Take 5 mg by mouth 3 (three) times daily.) 270 tablet 1   cephALEXin (KEFLEX) 500 MG capsule Take 1 capsule (500 mg total) by mouth 2 (two) times daily for 8 days. 15 capsule 0   cetirizine (ZYRTEC) 10 MG tablet Take 10 mg by mouth at bedtime.      Cholecalciferol (VITAMIN D3 PO) Take 2,000 Units by mouth daily.     dapagliflozin propanediol (FARXIGA) 10 MG TABS tablet Take 1 tablet (10 mg total) by mouth daily before breakfast. 90 tablet 3   diltiazem (CARDIZEM CD) 180 MG 24 hr capsule Take 1 capsule (180 mg total) by mouth daily. 90 capsule 3   Dulaglutide (TRULICITY) 4.5 CH/8.8FO SOPN Inject 4.5 mg as directed once a week. (Patient taking differently: Inject 4.5 mg as directed every Monday.) 4.5 mL 3   ferrous sulfate 324 (65 Fe) MG TBEC Take 324 mg by mouth daily with breakfast.      fluticasone (FLONASE) 50 MCG/ACT nasal spray Place 2 sprays into both nostrils daily.      insulin glargine (LANTUS SOLOSTAR) 100 UNIT/ML Solostar Pen Inject 5 Units into the skin every morning. 30 mL 3   isosorbide mononitrate (IMDUR) 30 MG 24 hr tablet Take 1 tablet (30 mg total) by mouth daily. 90 tablet 2   metFORMIN (GLUCOPHAGE-XR) 500 MG 24 hr tablet Take 4 tablets (2,000 mg total) by mouth daily. 360 tablet 3   metoprolol succinate  (TOPROL-XL) 50 MG 24 hr tablet Take 1 tablet (50 mg total) by mouth daily. Take with or immediately following a meal. 90 tablet 2   omeprazole (PRILOSEC) 20 MG capsule Take 20 mg by mouth every morning.     Oxycodone HCl 10 MG TABS Take by mouth.     prazosin (MINIPRESS) 2 MG capsule TAKE TWO CAPSULES BY MOUTH EVERYDAY  AT BEDTIME 180 capsule 3   pregabalin (LYRICA) 150 MG capsule Take 1 capsule (150 mg total) by mouth 3 (three) times daily. Please discuss future refills with your PCP. 270 capsule 1   repaglinide (PRANDIN) 2 MG tablet Take 1 tablet (2 mg total) by mouth 2 (two) times daily before a meal. 180 tablet 3   spironolactone (ALDACTONE) 25 MG tablet Take 1 tablet (25 mg total) by mouth daily. 90 tablet 2   temazepam (RESTORIL) 15 MG capsule TAKE ONE CAPSULE BY MOUTH EVERYDAY AT BEDTIME 30 capsule 1   traZODone (DESYREL) 100 MG tablet Take 2 tablets (200 mg total) by mouth at bedtime. 180 tablet 3   venlafaxine XR (EFFEXOR-XR) 75 MG 24 hr capsule Take 3 capsules (225 mg total) by mouth daily with breakfast. 270 capsule 3   vitamin B-12 1000 MCG tablet Take 1 tablet (1,000 mcg total) by mouth daily.     ARIPiprazole (ABILIFY) 5 MG tablet TAKE ONE TABLET BY MOUTH EVERYDAY AT BEDTIME 30 tablet 0   potassium chloride SA (KLOR-CON M) 20 MEQ tablet Take 2 tablets (40 mEq total) by mouth every morning AND 1 tablet (20 mEq total) every evening. 270 tablet 3   torsemide (DEMADEX) 20 MG tablet Take 3 tablets (60 mg total) by mouth every morning AND 2 tablets (40 mg total) every evening. 450 tablet 3   No current facility-administered medications for this encounter.   BP 110/84   Pulse 77   Wt (!) 175.1 kg (386 lb)   SpO2 93%   BMI 49.56 kg/m  General: NAD Neck: Thick, JVP difficult but estimate 8-9 cm, no thyromegaly or thyroid nodule.  Lungs: Clear to auscultation bilaterally with normal respiratory effort. CV: Nondisplaced PMI.  Heart regular S1/S2, no S3/S4, no murmur.  Chronic 1+ edema to  knees.  No carotid bruit.  Normal pedal pulses.  Abdomen: Soft, nontender, no hepatosplenomegaly, no distention.  Skin: Intact without lesions or rashes.  Neurologic: Alert and oriented x 3.  Psych: Normal affect. Extremities: No clubbing or cyanosis.  HEENT: Normal.   Assessment/Plan: 1. Chronic diastolic CHF: Echo (77/93) with EF 60-65%, mild LVH, mildly dilated LV, moderate aortic stenosis (trileaflet aortic valve) with mean gradient 22 mmHg, DI 0.27.  Symptoms are confounded by morbid obesity.  Exam is difficult for volume.  He has had multiple admissions this year with CHF.  NYHA class IIIb symptoms, probably at least mildly volume overloaded on exam.  - Increase torsemide to 60 qam/40 qpm and KCl to 40 qam/20 qpm.  BMET/BNP today, BMET in 10 days.  - With difficult exam for volume and multiple admissions, I am going to arrange for RHC + Cardiomems placement to allow closer volume monitoring.  We discussed risks/benefits and he agrees to the procedure. He will hold Eliquis the day before and day of procedure.  - Continue dapagliflozin 10 daily.  - Continue spironolactone 25 daily. 2. Aortic stenosis: At least moderate AS on last echo though images difficult.  Given significant symptoms (NYHA class IIIb), I will arrange for TEE to more closely assess the aortic valve to see if AS is truly severe.  We discussed risks/benefits and he agrees to procedure.  3. Atrial fibrillation: Paroxysmal. He is in NSR today.  - Continue apixaban.  - Continue Toprol XL and diltiazem CD.  4. OSA/?OHS: Continue Bipap.   Loralie Champagne 07/13/2021

## 2021-07-13 NOTE — Progress Notes (Signed)
CardioMems Implant    Day of arrival  Please arrive at entrance A on   What to bring  -Insurance card -A current list of medications -A partner in care, someone in your support network, to:  Participate in the education session and assist where needed with home care  Drive you home  During your stay  The right heart catheterization  will take approximately 1 hour  You will receive patient education and necessary resources for transmissions (special pillow and antennae) prior to leaving  Once you are at home  Lay on the pillow and send a transmission every morning Make sure to take your Plavix 75 mg daily for one month and Aspirin ___81__mg daily for life Our office will call you weekly for the first 3 weeks You will be seen for a follow up appointment as scheduled  You will receive periodic phone calls either from our office or automated system You will receive a bill from our office once a month

## 2021-07-15 ENCOUNTER — Ambulatory Visit (INDEPENDENT_AMBULATORY_CARE_PROVIDER_SITE_OTHER): Payer: Medicare HMO | Admitting: Endocrinology

## 2021-07-15 ENCOUNTER — Other Ambulatory Visit: Payer: Self-pay

## 2021-07-15 VITALS — BP 140/74 | HR 90 | Ht 74.0 in

## 2021-07-15 DIAGNOSIS — E1159 Type 2 diabetes mellitus with other circulatory complications: Secondary | ICD-10-CM

## 2021-07-15 DIAGNOSIS — R6889 Other general symptoms and signs: Secondary | ICD-10-CM | POA: Diagnosis not present

## 2021-07-15 LAB — POCT GLYCOSYLATED HEMOGLOBIN (HGB A1C): Hemoglobin A1C: 7.1 % — AB (ref 4.0–5.6)

## 2021-07-15 MED ORDER — REPAGLINIDE 2 MG PO TABS: 4.0000 mg | ORAL_TABLET | Freq: Two times a day (BID) | ORAL | 3 refills | Status: DC

## 2021-07-15 NOTE — Progress Notes (Signed)
Subjective:    Patient ID: Lawrence [name], male    DOB: 06/05/1956, 65 y.o.   MRN: [id number]  HPI Pt returns for f/u of diabetes mellitus: DM type: Insulin-requiring type 2 Dx'ed: 2019 Complications: PPN, CAD, and leg ulcers Therapy: insulin since 2022, Trulicity, and 3 oral meds DKA: never Severe hypoglycemia: never Pancreatitis: never Pancreatic imaging: none known SDOH: memory loss limts rx Other: he takes QD insulin, at least for now Interval history: He brings his meter with his cbg's which I have reviewed today.  Cbg varies from 109-270.  No new sxs.  He takes meds as rx'ed.   Past Medical History:  Diagnosis Date   Acquired dilation of ascending aorta and aortic root (HCC)    62mm by echo 01/2021   Adenomatous colon polyp 2007   Anemia    Anxiety    Aortic stenosis    moderate AS by echo 01/2021   Asthma    BPH without obstruction/lower urinary tract symptoms 02/22/2017   Chronic diastolic (congestive) heart failure (HCC)    Chronic venous stasis 03/07/2019   COPD (chronic obstructive pulmonary disease) (HCC)    Coronary artery calcification seen on CAT scan    Depression    Diabetic neuropathy (HCC) 09/11/2019   History of colon polyps 08/24/2018   Hypertension    Morbid obesity (HCC)    OSA (obstructive sleep apnea)    Pain due to onychomycosis of toenails of both feet 09/11/2019   Peripheral neuropathy 02/22/2017   Primary osteoarthritis, left shoulder 03/05/2017   PTSD (post-traumatic stress disorder)    Pure hypercholesterolemia    QT prolongation 03/07/2019   Seizures (HCC)    Shortness of breath    Sinus tachycardia 03/07/2019   Sleep apnea    CPAP   Type 2 diabetes mellitus with vascular disease (HCC) 09/11/2019    Past Surgical History:  Procedure Laterality Date   ENDOVENOUS ABLATION SAPHENOUS VEIN W/ LASER Right 08/20/2020   endovenous laser ablation right greater saphenous vein by Cari Caraway MD    JOINT REPLACEMENT     left knee  replacement x 2   KNEE ARTHROSCOPY Bilateral    LEFT HEART CATH AND CORONARY ANGIOGRAPHY N/A 01/17/2018   Procedure: LEFT HEART CATH AND CORONARY ANGIOGRAPHY;  Surgeon: Marykay Lex, MD;  Location: [facility] INVASIVE CV LAB;  Service: Cardiovascular;  Laterality: N/A;   UMBILICAL HERNIA REPAIR      Social History   Socioeconomic History   Marital status: Significant Other    Spouse name: [name]   Number of children: 1   Years of education: Not on file   Highest education level: Associate degree: academic program  Occupational History   Occupation: retired    Comment: Building control surveyor  Tobacco Use   Smoking status: Former    Packs/day: 1.00    Years: 44.00    Pack years: 44.00    Types: Cigarettes    Quit date: 03/07/2016    Years since quitting: 5.3    Passive exposure: Past   Smokeless tobacco: Never  Vaping Use   Vaping Use: Never used  Substance and Sexual Activity   Alcohol use: Yes    Comment: rare   Drug use: No   Sexual activity: Not on file  Other Topics Concern   Not on file  Social History Narrative   Lives with wife [name]   Right Handed   Drinks 1-2 cups caffeine   Social Determinants of Health   Financial Resource Strain:  Medium Risk   Difficulty of Paying Living Expenses: Somewhat hard  Food Insecurity: Food Insecurity Present   Worried About Running Out of Food in the Last Year: Sometimes true   Ran Out of Food in the Last Year: Never true  Transportation Needs: Unmet Transportation Needs   Lack of Transportation (Medical): Yes   Lack of Transportation (Non-Medical): No  Physical Activity: Not on file  Stress: Not on file  Social Connections: Not on file  Intimate Partner Violence: Not on file    Current Outpatient Medications on File Prior to Visit  Medication Sig Dispense Refill   Accu-Chek FastClix Lancets MISC 1 each by Other route in the morning and at bedtime.     acetaminophen (TYLENOL) 325 MG tablet Take 2 tablets (650 mg total) by  mouth every 4 (four) hours as needed for mild pain (or temp > 37.5 C (99.5 F)).     albuterol (VENTOLIN HFA) 108 (90 Base) MCG/ACT inhaler Inhale 2 puffs into the lungs every 6 (six) hours as needed for wheezing or shortness of breath.     Alcohol Swabs (ALCOHOL PREP) 70 % PADS 1 each by Other route in the morning, at noon, and at bedtime.     allopurinol (ZYLOPRIM) 100 MG tablet Take 200 mg by mouth daily.     apixaban (ELIQUIS) 5 MG TABS tablet Take 1 tablet (5 mg total) by mouth 2 (two) times daily. 180 tablet 2   ARIPiprazole (ABILIFY) 5 MG tablet TAKE ONE TABLET BY MOUTH EVERYDAY AT BEDTIME 30 tablet 0   atorvastatin (LIPITOR) 40 MG tablet Take 1 tablet (40 mg total) by mouth every evening. TAKE ONE TABLET BY MOUTH DAILY AT 6PM 90 tablet 3   B-D ULTRAFINE III SHORT PEN 31G X 8 MM MISC 1 each by Other route See admin instructions. Use to inject insulin subcutaneously at bedtime as directed.     blood glucose meter kit and supplies Dispense based on patient and insurance preference. Use up to four times daily as directed. (FOR ICD-10 E10.9, E11.9). 1 each 0   busPIRone (BUSPAR) 5 MG tablet TAKE ONE TABLET BY MOUTH THREE TIMES DAILY (Patient taking differently: Take 5 mg by mouth 3 (three) times daily.) 270 tablet 1   cephALEXin (KEFLEX) 500 MG capsule Take 1 capsule (500 mg total) by mouth 2 (two) times daily for 8 days. 15 capsule 0   cetirizine (ZYRTEC) 10 MG tablet Take 10 mg by mouth at bedtime.      Cholecalciferol (VITAMIN D3 PO) Take 2,000 Units by mouth daily.     dapagliflozin propanediol (FARXIGA) 10 MG TABS tablet Take 1 tablet (10 mg total) by mouth daily before breakfast. 90 tablet 3   diltiazem (CARDIZEM CD) 180 MG 24 hr capsule Take 1 capsule (180 mg total) by mouth daily. 90 capsule 3   Dulaglutide (TRULICITY) 4.5 BX/0.3YB SOPN Inject 4.5 mg as directed once a week. (Patient taking differently: Inject 4.5 mg as directed every Monday.) 4.5 mL 3   ferrous sulfate 324 (65 Fe) MG TBEC  Take 324 mg by mouth daily with breakfast.      fluticasone (FLONASE) 50 MCG/ACT nasal spray Place 2 sprays into both nostrils daily.      isosorbide mononitrate (IMDUR) 30 MG 24 hr tablet Take 1 tablet (30 mg total) by mouth daily. 90 tablet 2   metFORMIN (GLUCOPHAGE-XR) 500 MG 24 hr tablet Take 4 tablets (2,000 mg total) by mouth daily. 360 tablet 3   metoprolol succinate (  TOPROL-XL) 50 MG 24 hr tablet Take 1 tablet (50 mg total) by mouth daily. Take with or immediately following a meal. 90 tablet 2   omeprazole (PRILOSEC) 20 MG capsule Take 20 mg by mouth every morning.     Oxycodone HCl 10 MG TABS Take by mouth.     potassium chloride SA (KLOR-CON M) 20 MEQ tablet Take 2 tablets (40 mEq total) by mouth every morning AND 1 tablet (20 mEq total) every evening. 270 tablet 3   prazosin (MINIPRESS) 2 MG capsule TAKE TWO CAPSULES BY MOUTH EVERYDAY AT BEDTIME 180 capsule 3   pregabalin (LYRICA) 150 MG capsule Take 1 capsule (150 mg total) by mouth 3 (three) times daily. Please discuss future refills with your PCP. 270 capsule 1   spironolactone (ALDACTONE) 25 MG tablet Take 1 tablet (25 mg total) by mouth daily. 90 tablet 2   temazepam (RESTORIL) 15 MG capsule TAKE ONE CAPSULE BY MOUTH EVERYDAY AT BEDTIME 30 capsule 1   torsemide (DEMADEX) 20 MG tablet Take 3 tablets (60 mg total) by mouth every morning AND 2 tablets (40 mg total) every evening. 450 tablet 3   traZODone (DESYREL) 100 MG tablet Take 2 tablets (200 mg total) by mouth at bedtime. 180 tablet 3   venlafaxine XR (EFFEXOR-XR) 75 MG 24 hr capsule Take 3 capsules (225 mg total) by mouth daily with breakfast. 270 capsule 3   vitamin B-12 1000 MCG tablet Take 1 tablet (1,000 mcg total) by mouth daily.     No current facility-administered medications on file prior to visit.      Family History  Problem Relation Age of Onset   CAD Maternal Grandfather    Diabetes Other    Diabetes Mellitus II Neg Hx    Colon cancer Neg Hx    Esophageal  cancer Neg Hx    Inflammatory bowel disease Neg Hx    Liver disease Neg Hx    Pancreatic cancer Neg Hx    Rectal cancer Neg Hx    Stomach cancer Neg Hx    Sleep apnea Neg Hx     BP 140/74   Pulse 90   Ht $R'6\' 2"'Hu$  (1.88 m)   SpO2 92%   BMI 49.56 kg/m    Review of Systems     Objective:   Physical Exam  Lab Results  Component Value Date   CREATININE 0.96 07/13/2021   BUN 20 07/13/2021   NA 136 07/13/2021   K 4.1 07/13/2021   CL 99 07/13/2021   CO2 28 07/13/2021    Lab Results  Component Value Date   HGBA1C 7.1 (A) 07/15/2021      Assessment & Plan:  Type 2 DM: uncontrolled.    Patient Instructions  check your blood sugar twice a day.  vary the time of day when you check, between before the 3 meals, and at bedtime.  also check if you have symptoms of your blood sugar being too high or too low.  please keep a record of the readings and bring it to your next appointment here (or you can bring the meter itself).  You can write it on any piece of paper.  please call us sooner if your blood sugar goes below 70, or if most of your readings are over 200.  Please stop taking the Lantus.   I have sent a prescription to your pharmacy, to double the repaglinide.   Please continue the same other diabetes medications.   Please come back for  a follow-up appointment in 2-3 months.

## 2021-07-15 NOTE — Patient Instructions (Addendum)
check your blood sugar twice a day.  vary the time of day when you check, between before the 3 meals, and at bedtime.  also check if you have symptoms of your blood sugar being too high or too low.  please keep a record of the readings and bring it to your next appointment here (or you can bring the meter itself).  You can write it on any piece of paper.  please call us sooner if your blood sugar goes below 70, or if most of your readings are over 200.  Please stop taking the Lantus.   I have sent a prescription to your pharmacy, to double the repaglinide.   Please continue the same other diabetes medications.   Please come back for a follow-up appointment in 2-3 months.

## 2021-07-17 ENCOUNTER — Encounter (HOSPITAL_COMMUNITY): Payer: Self-pay | Admitting: Cardiology

## 2021-07-19 ENCOUNTER — Encounter: Payer: Self-pay | Admitting: Pulmonary Disease

## 2021-07-19 ENCOUNTER — Ambulatory Visit (INDEPENDENT_AMBULATORY_CARE_PROVIDER_SITE_OTHER): Payer: Medicare HMO | Admitting: Pulmonary Disease

## 2021-07-19 ENCOUNTER — Other Ambulatory Visit: Payer: Self-pay

## 2021-07-19 VITALS — BP 136/72 | HR 84 | Temp 97.9°F | Ht 74.0 in | Wt 381.0 lb

## 2021-07-19 DIAGNOSIS — R002 Palpitations: Secondary | ICD-10-CM

## 2021-07-19 DIAGNOSIS — R6889 Other general symptoms and signs: Secondary | ICD-10-CM | POA: Diagnosis not present

## 2021-07-19 DIAGNOSIS — R0609 Other forms of dyspnea: Secondary | ICD-10-CM

## 2021-07-19 MED ORDER — STIOLTO RESPIMAT 2.5-2.5 MCG/ACT IN AERS: 2.0000 | INHALATION_SPRAY | Freq: Every day | RESPIRATORY_TRACT | 3 refills | Status: DC

## 2021-07-19 NOTE — Patient Instructions (Signed)
Nice to meet you  I think there are many reasons for your shortness of breath  1) trouble with fluid balance and atrial fibrillation related to the heart  2) possible inflammation in the air tubes of your lungs related to cigarette smoking or something like asthma given there are allergy symptoms  3) possible development of COPD due to history of cigarette smoking  4) weight and pressure from the stomach and chest causing air sacs to get sticky, causing small areas of collapsed lung leading to trouble exchanging oxygen and carbon dioxide  To help treat your symptoms, I recommend an inhaler Stiolto 2 puffs once a day.  This is designed to open up the airways and help you breathe more easily.  Since the albuterol helps am hopeful this will help.  To further evaluate reasons for your shortness of breath, I recommend pulmonary function test.  These can be scheduled next available at your convenience.  Return to clinic in 3 months or sooner as needed with Dr. Silas Flood

## 2021-07-19 NOTE — Telephone Encounter (Signed)
Called and spoke with pt. Pt said he was at his pulmonologist and would see if they can perform ekg and route results. Pt said he would call or message if they could not get an ekg.

## 2021-07-20 ENCOUNTER — Other Ambulatory Visit: Payer: Self-pay

## 2021-07-20 ENCOUNTER — Encounter (HOSPITAL_COMMUNITY): Payer: Self-pay | Admitting: Emergency Medicine

## 2021-07-20 ENCOUNTER — Emergency Department (HOSPITAL_COMMUNITY): Payer: No Typology Code available for payment source

## 2021-07-20 ENCOUNTER — Emergency Department (HOSPITAL_COMMUNITY)
Admission: EM | Admit: 2021-07-20 | Discharge: 2021-07-21 | Disposition: A | Discharge status: Home / Self Care | Payer: No Typology Code available for payment source | Attending: Emergency Medicine | Admitting: Emergency Medicine

## 2021-07-20 ENCOUNTER — Encounter (HOSPITAL_COMMUNITY): Payer: Self-pay | Admitting: Cardiology

## 2021-07-20 DIAGNOSIS — I4891 Unspecified atrial fibrillation: Secondary | ICD-10-CM | POA: Insufficient documentation

## 2021-07-20 DIAGNOSIS — I499 Cardiac arrhythmia, unspecified: Secondary | ICD-10-CM | POA: Insufficient documentation

## 2021-07-20 DIAGNOSIS — I5033 Acute on chronic diastolic (congestive) heart failure: Secondary | ICD-10-CM | POA: Insufficient documentation

## 2021-07-20 DIAGNOSIS — Z743 Need for continuous supervision: Secondary | ICD-10-CM | POA: Diagnosis not present

## 2021-07-20 DIAGNOSIS — R2243 Localized swelling, mass and lump, lower limb, bilateral: Secondary | ICD-10-CM | POA: Diagnosis not present

## 2021-07-20 DIAGNOSIS — Z87891 Personal history of nicotine dependence: Secondary | ICD-10-CM | POA: Insufficient documentation

## 2021-07-20 DIAGNOSIS — Z96652 Presence of left artificial knee joint: Secondary | ICD-10-CM | POA: Insufficient documentation

## 2021-07-20 DIAGNOSIS — R5381 Other malaise: Secondary | ICD-10-CM | POA: Insufficient documentation

## 2021-07-20 DIAGNOSIS — E119 Type 2 diabetes mellitus without complications: Secondary | ICD-10-CM | POA: Insufficient documentation

## 2021-07-20 DIAGNOSIS — I498 Other specified cardiac arrhythmias: Secondary | ICD-10-CM

## 2021-07-20 DIAGNOSIS — R6889 Other general symptoms and signs: Secondary | ICD-10-CM | POA: Diagnosis not present

## 2021-07-20 DIAGNOSIS — I110 Hypertensive heart disease with heart failure: Secondary | ICD-10-CM | POA: Diagnosis not present

## 2021-07-20 DIAGNOSIS — J441 Chronic obstructive pulmonary disease with (acute) exacerbation: Secondary | ICD-10-CM | POA: Insufficient documentation

## 2021-07-20 DIAGNOSIS — I480 Paroxysmal atrial fibrillation: Secondary | ICD-10-CM | POA: Diagnosis not present

## 2021-07-20 DIAGNOSIS — J45909 Unspecified asthma, uncomplicated: Secondary | ICD-10-CM | POA: Diagnosis not present

## 2021-07-20 DIAGNOSIS — R079 Chest pain, unspecified: Secondary | ICD-10-CM | POA: Diagnosis not present

## 2021-07-20 DIAGNOSIS — R0789 Other chest pain: Secondary | ICD-10-CM | POA: Diagnosis not present

## 2021-07-20 LAB — COMPREHENSIVE METABOLIC PANEL
ALT: 28 U/L (ref 0–44)
AST: 21 U/L (ref 15–41)
Albumin: 3.9 g/dL (ref 3.5–5.0)
Alkaline Phosphatase: 84 U/L (ref 38–126)
Anion gap: 13 (ref 5–15)
BUN: 17 mg/dL (ref 8–23)
CO2: 28 mmol/L (ref 22–32)
Calcium: 9.2 mg/dL (ref 8.9–10.3)
Chloride: 98 mmol/L (ref 98–111)
Creatinine, Ser: 1.04 mg/dL (ref 0.61–1.24)
GFR, Estimated: >60 mL/min (ref >60)
Glucose, Bld: 89 mg/dL (ref 70–99)
Potassium: 3.5 mmol/L (ref 3.5–5.1)
Sodium: 139 mmol/L (ref 135–145)
Total Bilirubin: 0.6 mg/dL (ref 0.3–1.2)
Total Protein: 7.4 g/dL (ref 6.5–8.1)

## 2021-07-20 LAB — CBC
HCT: 46.4 % (ref 39.0–52.0)
Hemoglobin: 14.3 g/dL (ref 13.0–17.0)
MCH: 28.7 pg (ref 26.0–34.0)
MCHC: 30.8 g/dL (ref 30.0–36.0)
MCV: 93 fL (ref 80.0–100.0)
Platelets: 270 10*3/uL (ref 150–400)
RBC: 4.99 MIL/uL (ref 4.22–5.81)
RDW: 15 % (ref 11.5–15.5)
WBC: 14 10*3/uL — ABNORMAL HIGH (ref 4.0–10.5)
nRBC: 0 % (ref 0.0–0.2)

## 2021-07-20 LAB — TROPONIN I (HIGH SENSITIVITY)
Troponin I (High Sensitivity): 6 ng/L (ref <18)
Troponin I (High Sensitivity): 9 ng/L (ref <18)

## 2021-07-20 LAB — MAGNESIUM: Magnesium: 2.2 mg/dL (ref 1.7–2.4)

## 2021-07-20 MED ORDER — OXYCODONE HCL 5 MG PO TABS
10.0000 mg | ORAL_TABLET | Freq: Once | ORAL | Status: AC
  Administered 2021-07-20: 10 mg via ORAL
  Filled 2021-07-20: qty 2

## 2021-07-20 NOTE — ED Triage Notes (Signed)
Pt to triage via [facility] from home.  C/o L lower chest pain x 3 days. States he normally has this pain when he is in A Fib.   Mild SOB.  Denies nausea and vomiting.   EMS- 12 lead- Afib ASA 324mg  IV- 20g L hand No NTG BP 132/68

## 2021-07-20 NOTE — ED Provider Notes (Signed)
[facility] EMERGENCY DEPARTMENT Provider Note   CSN: [id number] Arrival date & time: 07/20/21  1155     History Chief Complaint  Patient presents with   Atrial Fibrillation   Chest Pain    Lawrence [name] is a 65 y.o. male.  HPI He is here for evaluation of dyspnea on exertion, progressive and worsening over several weeks.  He has also had some vague left-sided chest pain that comes and goes.  He is taking his usual medications.  He saw a pulmonologist yesterday, and was given a prescription for new inhaler.  He uses BiPAP which does not include oxygen at home.  No recent fever, chills, vomiting, focal weakness or paresthesia.  He is taking his usual medicines.  He came here by EMS.  He is here with his daughter who reports that the patient has trouble making it to his appointments because he requires transportation "in a bus."  There are no other known active modifying factors.    Past Medical History:  Diagnosis Date   Acquired dilation of ascending aorta and aortic root ([place])    14mm by echo 01/2021   Adenomatous colon polyp 2007   Anemia    Anxiety    Aortic stenosis    moderate AS by echo 01/2021   Asthma    BPH without obstruction/lower urinary tract symptoms 02/22/2017   Chronic diastolic (congestive) heart failure (HCC)    Chronic venous stasis 03/07/2019   COPD (chronic obstructive pulmonary disease) (HCC)    Coronary artery calcification seen on CAT scan    Depression    Diabetic neuropathy ([place]) 09/11/2019   History of colon polyps 08/24/2018   Hypertension    Morbid obesity (HCC)    OSA (obstructive sleep apnea)    Pain due to onychomycosis of toenails of both feet 09/11/2019   Peripheral neuropathy 02/22/2017   Primary osteoarthritis, left shoulder 03/05/2017   PTSD (post-traumatic stress disorder)    Pure hypercholesterolemia    QT prolongation 03/07/2019   Seizures (HCC)    Shortness of breath    Sinus tachycardia 03/07/2019   Sleep  apnea    CPAP   Type 2 diabetes mellitus with vascular disease ([place]) 09/11/2019    Patient Active Problem List   Diagnosis Date Noted   Paronychia of great toe, left 07/07/2021   CHF exacerbation ([place]) 06/08/2021   Stroke-like symptoms 04/27/2021   Ambulatory dysfunction 04/27/2021   Left-sided weakness 02/09/2021   Atrial fibrillation with rapid ventricular response ([place]) 02/08/2021   Acute left-sided weakness 01/25/2021   AF (paroxysmal atrial fibrillation) ([place]) 01/25/2021   COPD (chronic obstructive pulmonary disease) ([place]) 01/25/2021   Moderate aortic stenosis 01/21/2021   Acquired dilation of ascending aorta and aortic root ([place]) 01/21/2021   Coronary artery disease due to lipid rich plaque    SVT (supraventricular tachycardia) (HCC)    Partial seizure ([place])    Acute CHF (congestive heart failure) ([place]) 10/17/2020   Essential hypertension 10/17/2020   Hypokalemia 10/17/2020   Cellulitis of right leg 12/04/2019   Pain due to onychomycosis of toenails of both feet 09/11/2019   Diabetic neuropathy ([place]) 09/11/2019   Type 2 diabetes mellitus with diabetic polyneuropathy, with long-term current use of insulin ([place]) 09/11/2019   Sinus tachycardia 03/07/2019   Chronic venous stasis 03/07/2019   History of anemia 12/26/2018   Anemia 08/24/2018   History of colon polyps 08/24/2018   Do not resuscitate status 08/24/2018   Chronic diastolic CHF (congestive heart failure) (  [place]) 08/18/2018   Bilateral lower leg cellulitis    Coronary artery calcification seen on CAT scan    Pure hypercholesterolemia    Chest pain    Shortness of breath    Acute respiratory failure with hypoxia ([place]) 01/13/2018   Right foot pain 01/13/2018   Acute on chronic diastolic CHF (congestive heart failure) ([place]) 01/13/2018   Diabetes mellitus type 2 in obese ([place]) 01/13/2018   New onset type 2 diabetes mellitus ([place]) 11/08/2017   Lower extremity cellulitis 11/07/2017   Primary osteoarthritis, left  shoulder 03/05/2017   Essential hypertension, benign 02/22/2017   BPH without obstruction/lower urinary tract symptoms 02/22/2017   Hematuria 02/22/2017   Peripheral neuropathy 02/22/2017   Physical deconditioning 02/22/2017   Morbid obesity ([place])    PTSD (post-traumatic stress disorder)    Anasarca 01/31/2017   Bilateral lower extremity edema 01/31/2017   OSA (obstructive sleep apnea)     Past Surgical History:  Procedure Laterality Date   ENDOVENOUS ABLATION SAPHENOUS VEIN W/ LASER Right 08/20/2020   endovenous laser ablation right greater saphenous vein by Gae Gallop MD    JOINT REPLACEMENT     left knee replacement x 2   KNEE ARTHROSCOPY Bilateral    LEFT HEART CATH AND CORONARY ANGIOGRAPHY N/A 01/17/2018   Procedure: LEFT HEART CATH AND CORONARY ANGIOGRAPHY;  Surgeon: Leonie Man, MD;  Location: [facility];  Service: Cardiovascular;  Laterality: N/A;   UMBILICAL HERNIA REPAIR         Family History  Problem Relation Age of Onset   CAD Maternal Grandfather    Diabetes Other    Diabetes Mellitus II Neg Hx    Colon cancer Neg Hx    Esophageal cancer Neg Hx    Inflammatory bowel disease Neg Hx    Liver disease Neg Hx    Pancreatic cancer Neg Hx    Rectal cancer Neg Hx    Stomach cancer Neg Hx    Sleep apnea Neg Hx     Social History   Tobacco Use   Smoking status: Former    Packs/day: 1.00    Years: 44.00    Pack years: 44.00    Types: Cigarettes    Quit date: 03/07/2016    Years since quitting: 5.3    Passive exposure: Past   Smokeless tobacco: Never  Vaping Use   Vaping Use: Never used  Substance Use Topics   Alcohol use: Yes    Comment: rare   Drug use: No    Home Medications Prior to Admission medications   Medication Sig Start Date End Date Taking? Authorizing Provider  Accu-Chek FastClix Lancets MISC 1 each by Other route in the morning and at bedtime. 12/22/20   [provider]  acetaminophen (TYLENOL) 325 MG tablet Take  2 tablets (650 mg total) by mouth every 4 (four) hours as needed for mild pain (or temp > 37.5 C (99.5 F)). 01/26/21   Cherene Altes, MD  albuterol (VENTOLIN HFA) 108 (90 Base) MCG/ACT inhaler Inhale 2 puffs into the lungs every 6 (six) hours as needed for wheezing or shortness of breath. 03/10/20   [provider]  Alcohol Swabs (ALCOHOL PREP) 70 % PADS 1 each by Other route in the morning, at noon, and at bedtime. 12/22/20   [provider]  allopurinol (ZYLOPRIM) 100 MG tablet Take 200 mg by mouth daily.    [provider]  apixaban (ELIQUIS) 5 MG TABS tablet Take 1 tablet (5 mg  total) by mouth 2 (two) times daily. 06/24/21   Sueanne Margarita, MD  ARIPiprazole (ABILIFY) 5 MG tablet TAKE ONE TABLET BY MOUTH EVERYDAY AT BEDTIME 07/13/21   Mozingo, Berdie Ogren, NP  atorvastatin (LIPITOR) 40 MG tablet Take 1 tablet (40 mg total) by mouth every evening. TAKE ONE TABLET BY MOUTH DAILY AT 6PM 06/24/21   Sueanne Margarita, MD  B-D ULTRAFINE III SHORT PEN 31G X 8 MM MISC 1 each by Other route See admin instructions. Use to inject insulin subcutaneously at bedtime as directed. 11/20/20   [provider]  blood glucose meter kit and supplies Dispense based on patient and insurance preference. Use up to four times daily as directed. (FOR ICD-10 E10.9, E11.9). 11/09/17   Eugenie Filler, MD  busPIRone (BUSPAR) 5 MG tablet TAKE ONE TABLET BY MOUTH THREE TIMES DAILY Patient taking differently: Take 5 mg by mouth 3 (three) times daily. 04/20/21   Mozingo, Berdie Ogren, NP  cetirizine (ZYRTEC) 10 MG tablet Take 10 mg by mouth at bedtime.     [provider]  Cholecalciferol (VITAMIN D3 PO) Take 2,000 Units by mouth daily.    [provider]  dapagliflozin propanediol (FARXIGA) 10 MG TABS tablet Take 1 tablet (10 mg total) by mouth daily before breakfast. 01/05/21   Renato Shin, MD  diltiazem (CARDIZEM CD) 180 MG 24 hr capsule Take 1 capsule (180 mg total) by  mouth daily. 06/24/21   Turner, Eber Hong, MD  Dulaglutide (TRULICITY) 4.5 HM/0.9OB SOPN Inject 4.5 mg as directed once a week. Patient taking differently: Inject 4.5 mg as directed every Monday. 11/19/20   Renato Shin, MD  ferrous sulfate 324 (65 Fe) MG TBEC Take 324 mg by mouth daily with breakfast.     [provider]  fluticasone (FLONASE) 50 MCG/ACT nasal spray Place 2 sprays into both nostrils daily.     [provider]  isosorbide mononitrate (IMDUR) 30 MG 24 hr tablet Take 1 tablet (30 mg total) by mouth daily. 06/24/21   Sueanne Margarita, MD  metFORMIN (GLUCOPHAGE-XR) 500 MG 24 hr tablet Take 4 tablets (2,000 mg total) by mouth daily. 06/16/21   Renato Shin, MD  metoprolol succinate (TOPROL-XL) 50 MG 24 hr tablet Take 1 tablet (50 mg total) by mouth daily. Take with or immediately following a meal. 06/24/21 09/22/21  Turner, Eber Hong, MD  omeprazole (PRILOSEC) 20 MG capsule Take 20 mg by mouth every morning. 05/29/20   [provider]  Oxycodone HCl 10 MG TABS Take by mouth. 06/25/21 07/25/21  [provider]  potassium chloride SA (KLOR-CON M) 20 MEQ tablet Take 2 tablets (40 mEq total) by mouth every morning AND 1 tablet (20 mEq total) every evening. 07/13/21   Larey Dresser, MD  prazosin (MINIPRESS) 2 MG capsule TAKE TWO CAPSULES BY MOUTH EVERYDAY AT BEDTIME 07/08/21   Sueanne Margarita, MD  pregabalin (LYRICA) 150 MG capsule Take 1 capsule (150 mg total) by mouth 3 (three) times daily. Please discuss future refills with your PCP. 07/12/21   Marcial Pacas, MD  repaglinide (PRANDIN) 2 MG tablet Take 2 tablets (4 mg total) by mouth 2 (two) times daily before a meal. 07/15/21   Renato Shin, MD  spironolactone (ALDACTONE) 25 MG tablet Take 1 tablet (25 mg total) by mouth daily. 06/24/21   Sueanne Margarita, MD  temazepam (RESTORIL) 15 MG capsule TAKE ONE CAPSULE BY MOUTH EVERYDAY AT BEDTIME 06/23/21   Mozingo, Berdie Ogren, NP  Tiotropium  Bromide-Olodaterol  (STIOLTO RESPIMAT) 2.5-2.5 MCG/ACT AERS Inhale 2 puffs into the lungs daily. 07/19/21   Hunsucker, Bonna Gains, MD  torsemide (DEMADEX) 20 MG tablet Take 3 tablets (60 mg total) by mouth every morning AND 2 tablets (40 mg total) every evening. 07/13/21   Larey Dresser, MD  traZODone (DESYREL) 100 MG tablet Take 2 tablets (200 mg total) by mouth at bedtime. 12/31/20   Mozingo, Berdie Ogren, NP  venlafaxine XR (EFFEXOR-XR) 75 MG 24 hr capsule Take 3 capsules (225 mg total) by mouth daily with breakfast. 12/10/20   Mozingo, Berdie Ogren, NP  vitamin B-12 1000 MCG tablet Take 1 tablet (1,000 mcg total) by mouth daily. 04/28/21   Mercy Riding, MD    Allergies    Vancomycin, Niacin, Niacin and related, Tubersol [tuberculin], and Doxycycline  Review of Systems   Review of Systems  All other systems reviewed and are negative.  Physical Exam Updated Vital Signs BP 107/68 (BP Location: Left Arm)    Pulse 92    Temp 98.7 F (37.1 C) (Oral)    Resp 16    Ht $R'6\' 2"'ai$  (1.88 m)    Wt (!) 173.3 kg    SpO2 95%    BMI 49.05 kg/m   Physical Exam Vitals and nursing note reviewed.  Constitutional:      General: He is not in acute distress.    Appearance: He is well-developed. He is obese. He is not ill-appearing, toxic-appearing or diaphoretic.  HENT:     Head: Normocephalic and atraumatic.     Right Ear: External ear normal.     Left Ear: External ear normal.  Eyes:     Conjunctiva/sclera: Conjunctivae normal.     Pupils: Pupils are equal, round, and reactive to light.  Neck:     Trachea: Phonation normal.  Cardiovascular:     Rate and Rhythm: Normal rate. Rhythm irregular.     Heart sounds: Normal heart sounds.  Pulmonary:     Effort: Pulmonary effort is normal.     Breath sounds: Normal breath sounds.  Abdominal:     General: There is no distension.     Palpations: Abdomen is soft.     Tenderness: There is no abdominal tenderness.  Musculoskeletal:        General: Normal range of motion.      Cervical back: Normal range of motion and neck supple.     Right lower leg: Edema present.     Left lower leg: Edema present.  Skin:    General: Skin is warm and dry.  Neurological:     Mental Status: He is alert and oriented to person, place, and time.     Cranial Nerves: No cranial nerve deficit.     Sensory: No sensory deficit.     Motor: No abnormal muscle tone.     Coordination: Coordination normal.  Psychiatric:        Mood and Affect: Mood normal.        Behavior: Behavior normal.        Thought Content: Thought content normal.        Judgment: Judgment normal.    ED Results / Procedures / Treatments   Labs (all labs ordered are listed, but only abnormal results are displayed) Labs Reviewed  CBC - Abnormal; Notable for the following components:      Result Value   WBC 14.0 (*)    All other components within normal limits  COMPREHENSIVE METABOLIC PANEL  MAGNESIUM  TROPONIN I (HIGH SENSITIVITY)  TROPONIN I (HIGH SENSITIVITY)    EKG EKG Interpretation  Date/Time:  Tuesday July 20 2021 12:09:01 EST Ventricular Rate:  91 PR Interval:  216 QRS Duration: 110 QT Interval:  294 QTC Calculation: 361 R Axis:   15 Text Interpretation: Sinus rhythm with 1st degree A-V block with Premature atrial complexes in a pattern of bigeminy Low voltage QRS Incomplete right bundle branch block Inferior infarct , age undetermined Abnormal ECG since last tracing no significant change Confirmed by Daleen Bo [phone]) on 07/20/2021 2:43:28 PM  Radiology DG Chest 2 View  Result Date: 07/20/2021 CLINICAL DATA:  Dyspnea and chest pain EXAM: CHEST - 2 VIEW COMPARISON:  06/07/2021 FINDINGS: Mild cardiomegaly. Scarring and or atelectasis of the bilateral lung bases. No acute appearing airspace opacity. Disc degenerative disease of the thoracic spine. IMPRESSION: Mild cardiomegaly without acute abnormality of the lungs. Electronically Signed   By: Delanna Ahmadi M.D.   On: 07/20/2021  12:51    Procedures Procedures   Medications Ordered in ED Medications - No data to display  ED Course  I have reviewed the triage vital signs and the nursing notes.  Pertinent labs & imaging results that were available during my care of the patient were reviewed by me and considered in my medical decision making (see chart for details).    MDM Rules/Calculators/A&P                            Patient Vitals for the past 24 hrs:  BP Temp Temp src Pulse Resp SpO2 Height Weight  07/20/21 1441 107/68 98.7 F (37.1 C) Oral 92 16 95 % $Re'6\' 2"'cAu$  (1.88 m) (!) 173.3 kg  07/20/21 1333 (!) 100/58 98.7 F (37.1 C) -- 94 17 90 % -- --  07/20/21 1331 92/60 98.7 F (37.1 C) -- 93 -- (!) 89 % -- --  07/20/21 1207 113/64 98.5 F (36.9 C) Oral 89 18 94 % -- --    3:01 PM Reevaluation with update and discussion. After initial assessment and treatment, an updated evaluation reveals no further complaints.  Findings discussed with the patient and his daughter, all questions were answered. Daleen Bo   Medical Decision Making:  This patient is presenting for evaluation of dyspnea on exertion, which does require a range of treatment options, and is a complaint that involves a moderate risk of morbidity and mortality. The differential diagnoses include congestive heart failure, worsening chronic respiratory illness, cardiac arrhythmia. I decided to review old records, and in summary elderly male being followed closely by pulmonary and cardiology.  He is due to have a cardiac catheterization and TEE with additional cardiac interventions.  These have been delayed because of insurance authorization difficulties.  I obtained additional historical information from daughter at bedside.  Clinical Laboratory Tests Ordered, included CBC and Metabolic panel, troponin. Review indicates normal except white count elevated. Radiologic Tests Ordered, included chest x-ray.  I independently Visualized: Radiographic  images, which show no acute abnormalities  Cardiac Monitor Tracing which shows sinus rhythm with PACs  I discussed the case findings and test results with Dr. Aundra Dubin.   Critical Interventions-clinical evaluation, laboratory testing, radiography, observation and reevaluation.  Telephone consultation with cardiology.  After These Interventions, the Patient was reevaluated and was found stable for discharge.  Patient is in sinus rhythm with reassuring vital signs and clinical evaluation.  Testing does not show any signs of acute metabolic or significant infectious  disorder.  Doubt acute coronary syndrome, respiratory failure or serious metabolic disorder.  He can be managed currently with medications as usual, and follow-up with his medical providers as planned  CRITICAL CARE-no Performed by: Daleen Bo  Nursing Notes Reviewed/ Care Coordinated Applicable Imaging Reviewed Interpretation of Laboratory Data incorporated into ED treatment  The patient appears reasonably screened and/or stabilized for discharge and I doubt any other medical condition or other [facility] requiring further screening, evaluation, or treatment in the ED at this time prior to discharge.  Plan: Home Medications-usual; Home Treatments-regular activity and diet; return here if the recommended treatment, does not improve the symptoms; Recommended follow up-PCP and cardiology.     Final Clinical Impression(s) / ED Diagnoses Final diagnoses:  Sinus arrhythmia  Malaise    Rx / DC Orders ED Discharge Orders     None        Daleen Bo, MD 07/20/21 1505

## 2021-07-20 NOTE — ED Notes (Signed)
Patient is resting comfortably. 

## 2021-07-20 NOTE — ED Notes (Signed)
Pt given

## 2021-07-20 NOTE — ED Notes (Signed)
3rd in line for PTAR

## 2021-07-20 NOTE — Telephone Encounter (Signed)
Pt had ekg at pulmonology appt yesterday.pt said he could not make it to our office yesterday or today due to transportation.  Please review.

## 2021-07-20 NOTE — ED Notes (Signed)
Pt awaiting PTAR

## 2021-07-20 NOTE — Discharge Instructions (Signed)
Continue taking all your medications including the ones prescribed by the pulmonologist yesterday.  Follow-up with your heart doctor as planned, call them for an appointment sooner if needed.

## 2021-07-20 NOTE — Telephone Encounter (Signed)
Called to schedule pt an appt and was notified he was being taken to the ED by EMS for chest pain and abnormal rhythm.

## 2021-07-20 NOTE — ED Provider Notes (Signed)
Emergency Medicine Provider Triage Evaluation Note  Lawrence [name] , a 65 y.o. male  was evaluated in triage.  Pt complains of palpitations and chest pain x3 days.  Chest pain is left-sided nonradiating.  He also endorses intermittent shortness of breath and orthopnea as well as peripheral edema reports compliance with all of his home medications and has taken his morning meds today including his diuretic.[name]  Review of Systems  Positive: Shortness of breath, chest pain, palpitations, peripheral edema Negative: Fever  Physical Exam  BP 113/64 (BP Location: Left Arm)    Pulse 89    Temp 98.5 F (36.9 C) (Oral)    Resp 18    SpO2 94%  Gen:   Awake, no distress   Resp:  Normal effort  MSK:   Moves extremities without difficulty  Other:    Medical Decision Making  Medically screening exam initiated at 12:25 PM.  Appropriate orders placed.  Trellis Moment was informed that the remainder of the evaluation will be completed by another provider, this initial triage assessment does not replace that evaluation, and the importance of remaining in the ED until their evaluation is complete.     Evlyn Courier, PA-C 07/20/21 1227    Daleen Bo, MD 07/20/21 [phone]

## 2021-07-20 NOTE — Telephone Encounter (Signed)
Dr.McLean aware.

## 2021-07-20 NOTE — ED Notes (Signed)
Pt waiting on PTAR 

## 2021-07-21 DIAGNOSIS — E785 Hyperlipidemia, unspecified: Secondary | ICD-10-CM | POA: Diagnosis not present

## 2021-07-21 DIAGNOSIS — E1165 Type 2 diabetes mellitus with hyperglycemia: Secondary | ICD-10-CM | POA: Diagnosis not present

## 2021-07-21 DIAGNOSIS — M2550 Pain in unspecified joint: Secondary | ICD-10-CM | POA: Diagnosis not present

## 2021-07-21 DIAGNOSIS — E1169 Type 2 diabetes mellitus with other specified complication: Secondary | ICD-10-CM | POA: Diagnosis not present

## 2021-07-21 DIAGNOSIS — I499 Cardiac arrhythmia, unspecified: Secondary | ICD-10-CM | POA: Diagnosis not present

## 2021-07-21 DIAGNOSIS — I10 Essential (primary) hypertension: Secondary | ICD-10-CM | POA: Diagnosis not present

## 2021-07-21 DIAGNOSIS — F331 Major depressive disorder, recurrent, moderate: Secondary | ICD-10-CM | POA: Diagnosis not present

## 2021-07-21 DIAGNOSIS — J449 Chronic obstructive pulmonary disease, unspecified: Secondary | ICD-10-CM | POA: Diagnosis not present

## 2021-07-21 DIAGNOSIS — Z7401 Bed confinement status: Secondary | ICD-10-CM | POA: Diagnosis not present

## 2021-07-21 DIAGNOSIS — R0902 Hypoxemia: Secondary | ICD-10-CM | POA: Diagnosis not present

## 2021-07-21 DIAGNOSIS — M1990 Unspecified osteoarthritis, unspecified site: Secondary | ICD-10-CM | POA: Diagnosis not present

## 2021-07-21 DIAGNOSIS — G4700 Insomnia, unspecified: Secondary | ICD-10-CM | POA: Diagnosis not present

## 2021-07-21 DIAGNOSIS — I480 Paroxysmal atrial fibrillation: Secondary | ICD-10-CM | POA: Diagnosis not present

## 2021-07-29 DIAGNOSIS — R6889 Other general symptoms and signs: Secondary | ICD-10-CM | POA: Diagnosis not present

## 2021-08-02 ENCOUNTER — Other Ambulatory Visit: Payer: Self-pay | Admitting: Adult Health

## 2021-08-02 DIAGNOSIS — G4700 Insomnia, unspecified: Secondary | ICD-10-CM

## 2021-08-05 DIAGNOSIS — Z01818 Encounter for other preprocedural examination: Secondary | ICD-10-CM | POA: Diagnosis not present

## 2021-08-05 DIAGNOSIS — M546 Pain in thoracic spine: Secondary | ICD-10-CM | POA: Diagnosis not present

## 2021-08-07 ENCOUNTER — Emergency Department (HOSPITAL_COMMUNITY): Payer: No Typology Code available for payment source

## 2021-08-07 ENCOUNTER — Other Ambulatory Visit (HOSPITAL_COMMUNITY): Payer: No Typology Code available for payment source

## 2021-08-07 ENCOUNTER — Inpatient Hospital Stay (HOSPITAL_COMMUNITY)
Admission: EM | Admit: 2021-08-07 | Discharge: 2021-08-12 | DRG: 871 | Disposition: A | Discharge status: Home Health | Payer: No Typology Code available for payment source | Attending: Internal Medicine | Admitting: Internal Medicine

## 2021-08-07 DIAGNOSIS — I499 Cardiac arrhythmia, unspecified: Secondary | ICD-10-CM | POA: Diagnosis not present

## 2021-08-07 DIAGNOSIS — R0602 Shortness of breath: Secondary | ICD-10-CM

## 2021-08-07 DIAGNOSIS — R0902 Hypoxemia: Secondary | ICD-10-CM | POA: Diagnosis not present

## 2021-08-07 DIAGNOSIS — A419 Sepsis, unspecified organism: Principal | ICD-10-CM | POA: Diagnosis present

## 2021-08-07 DIAGNOSIS — N400 Enlarged prostate without lower urinary tract symptoms: Secondary | ICD-10-CM | POA: Diagnosis present

## 2021-08-07 DIAGNOSIS — G4733 Obstructive sleep apnea (adult) (pediatric): Secondary | ICD-10-CM | POA: Diagnosis present

## 2021-08-07 DIAGNOSIS — K219 Gastro-esophageal reflux disease without esophagitis: Secondary | ICD-10-CM | POA: Diagnosis present

## 2021-08-07 DIAGNOSIS — Z66 Do not resuscitate: Secondary | ICD-10-CM | POA: Diagnosis present

## 2021-08-07 DIAGNOSIS — G40909 Epilepsy, unspecified, not intractable, without status epilepticus: Secondary | ICD-10-CM | POA: Diagnosis present

## 2021-08-07 DIAGNOSIS — I480 Paroxysmal atrial fibrillation: Secondary | ICD-10-CM | POA: Diagnosis not present

## 2021-08-07 DIAGNOSIS — D649 Anemia, unspecified: Secondary | ICD-10-CM | POA: Diagnosis present

## 2021-08-07 DIAGNOSIS — G8929 Other chronic pain: Secondary | ICD-10-CM | POA: Diagnosis present

## 2021-08-07 DIAGNOSIS — G4700 Insomnia, unspecified: Secondary | ICD-10-CM | POA: Diagnosis present

## 2021-08-07 DIAGNOSIS — J9601 Acute respiratory failure with hypoxia: Secondary | ICD-10-CM | POA: Diagnosis present

## 2021-08-07 DIAGNOSIS — I878 Other specified disorders of veins: Secondary | ICD-10-CM | POA: Diagnosis present

## 2021-08-07 DIAGNOSIS — Z743 Need for continuous supervision: Secondary | ICD-10-CM | POA: Diagnosis not present

## 2021-08-07 DIAGNOSIS — R404 Transient alteration of awareness: Secondary | ICD-10-CM | POA: Diagnosis not present

## 2021-08-07 DIAGNOSIS — E7800 Pure hypercholesterolemia, unspecified: Secondary | ICD-10-CM | POA: Diagnosis present

## 2021-08-07 DIAGNOSIS — Z6841 Body Mass Index (BMI) 40.0 and over, adult: Secondary | ICD-10-CM

## 2021-08-07 DIAGNOSIS — I110 Hypertensive heart disease with heart failure: Secondary | ICD-10-CM | POA: Diagnosis present

## 2021-08-07 DIAGNOSIS — Z794 Long term (current) use of insulin: Secondary | ICD-10-CM

## 2021-08-07 DIAGNOSIS — Z96652 Presence of left artificial knee joint: Secondary | ICD-10-CM | POA: Diagnosis present

## 2021-08-07 DIAGNOSIS — M19012 Primary osteoarthritis, left shoulder: Secondary | ICD-10-CM | POA: Diagnosis present

## 2021-08-07 DIAGNOSIS — R4182 Altered mental status, unspecified: Secondary | ICD-10-CM | POA: Diagnosis not present

## 2021-08-07 DIAGNOSIS — I2510 Atherosclerotic heart disease of native coronary artery without angina pectoris: Secondary | ICD-10-CM | POA: Diagnosis present

## 2021-08-07 DIAGNOSIS — Z79891 Long term (current) use of opiate analgesic: Secondary | ICD-10-CM

## 2021-08-07 DIAGNOSIS — Z8249 Family history of ischemic heart disease and other diseases of the circulatory system: Secondary | ICD-10-CM

## 2021-08-07 DIAGNOSIS — I25118 Atherosclerotic heart disease of native coronary artery with other forms of angina pectoris: Secondary | ICD-10-CM | POA: Diagnosis not present

## 2021-08-07 DIAGNOSIS — R569 Unspecified convulsions: Secondary | ICD-10-CM

## 2021-08-07 DIAGNOSIS — Z888 Allergy status to other drugs, medicaments and biological substances status: Secondary | ICD-10-CM

## 2021-08-07 DIAGNOSIS — J440 Chronic obstructive pulmonary disease with acute lower respiratory infection: Secondary | ICD-10-CM | POA: Diagnosis present

## 2021-08-07 DIAGNOSIS — R32 Unspecified urinary incontinence: Secondary | ICD-10-CM | POA: Diagnosis present

## 2021-08-07 DIAGNOSIS — E1142 Type 2 diabetes mellitus with diabetic polyneuropathy: Secondary | ICD-10-CM | POA: Diagnosis present

## 2021-08-07 DIAGNOSIS — I5032 Chronic diastolic (congestive) heart failure: Secondary | ICD-10-CM | POA: Diagnosis present

## 2021-08-07 DIAGNOSIS — M109 Gout, unspecified: Secondary | ICD-10-CM | POA: Diagnosis present

## 2021-08-07 DIAGNOSIS — I517 Cardiomegaly: Secondary | ICD-10-CM | POA: Diagnosis not present

## 2021-08-07 DIAGNOSIS — E119 Type 2 diabetes mellitus without complications: Secondary | ICD-10-CM

## 2021-08-07 DIAGNOSIS — Z881 Allergy status to other antibiotic agents status: Secondary | ICD-10-CM

## 2021-08-07 DIAGNOSIS — E1140 Type 2 diabetes mellitus with diabetic neuropathy, unspecified: Secondary | ICD-10-CM | POA: Diagnosis present

## 2021-08-07 DIAGNOSIS — E669 Obesity, unspecified: Secondary | ICD-10-CM

## 2021-08-07 DIAGNOSIS — Z86010 Personal history of colonic polyps: Secondary | ICD-10-CM

## 2021-08-07 DIAGNOSIS — J449 Chronic obstructive pulmonary disease, unspecified: Secondary | ICD-10-CM | POA: Diagnosis present

## 2021-08-07 DIAGNOSIS — Z833 Family history of diabetes mellitus: Secondary | ICD-10-CM

## 2021-08-07 DIAGNOSIS — Z87891 Personal history of nicotine dependence: Secondary | ICD-10-CM

## 2021-08-07 DIAGNOSIS — I10 Essential (primary) hypertension: Secondary | ICD-10-CM | POA: Diagnosis present

## 2021-08-07 DIAGNOSIS — Z79899 Other long term (current) drug therapy: Secondary | ICD-10-CM

## 2021-08-07 DIAGNOSIS — J441 Chronic obstructive pulmonary disease with (acute) exacerbation: Secondary | ICD-10-CM | POA: Diagnosis present

## 2021-08-07 DIAGNOSIS — F4310 Post-traumatic stress disorder, unspecified: Secondary | ICD-10-CM | POA: Diagnosis present

## 2021-08-07 DIAGNOSIS — Z7984 Long term (current) use of oral hypoglycemic drugs: Secondary | ICD-10-CM

## 2021-08-07 DIAGNOSIS — E1151 Type 2 diabetes mellitus with diabetic peripheral angiopathy without gangrene: Secondary | ICD-10-CM | POA: Diagnosis present

## 2021-08-07 DIAGNOSIS — R6889 Other general symptoms and signs: Secondary | ICD-10-CM | POA: Diagnosis not present

## 2021-08-07 DIAGNOSIS — Z20822 Contact with and (suspected) exposure to covid-19: Secondary | ICD-10-CM | POA: Diagnosis present

## 2021-08-07 DIAGNOSIS — F419 Anxiety disorder, unspecified: Secondary | ICD-10-CM | POA: Diagnosis present

## 2021-08-07 DIAGNOSIS — E876 Hypokalemia: Secondary | ICD-10-CM | POA: Diagnosis present

## 2021-08-07 DIAGNOSIS — I350 Nonrheumatic aortic (valve) stenosis: Secondary | ICD-10-CM | POA: Diagnosis present

## 2021-08-07 DIAGNOSIS — E1169 Type 2 diabetes mellitus with other specified complication: Secondary | ICD-10-CM | POA: Diagnosis present

## 2021-08-07 DIAGNOSIS — Z7901 Long term (current) use of anticoagulants: Secondary | ICD-10-CM

## 2021-08-07 DIAGNOSIS — J189 Pneumonia, unspecified organism: Secondary | ICD-10-CM | POA: Diagnosis present

## 2021-08-07 DIAGNOSIS — G928 Other toxic encephalopathy: Secondary | ICD-10-CM | POA: Diagnosis present

## 2021-08-07 DIAGNOSIS — R6520 Severe sepsis without septic shock: Secondary | ICD-10-CM | POA: Diagnosis present

## 2021-08-07 DIAGNOSIS — I471 Supraventricular tachycardia: Secondary | ICD-10-CM | POA: Diagnosis present

## 2021-08-07 DIAGNOSIS — F32A Depression, unspecified: Secondary | ICD-10-CM | POA: Diagnosis present

## 2021-08-07 DIAGNOSIS — R079 Chest pain, unspecified: Secondary | ICD-10-CM

## 2021-08-07 LAB — URINALYSIS, ROUTINE W REFLEX MICROSCOPIC
Bacteria, UA: NONE SEEN
Bilirubin Urine: NEGATIVE
Glucose, UA: >=500 mg/dL — AB
Ketones, ur: NEGATIVE mg/dL
Leukocytes,Ua: NEGATIVE
Nitrite: NEGATIVE
Protein, ur: NEGATIVE mg/dL
Specific Gravity, Urine: 1.02 (ref 1.005–1.030)
pH: 6 (ref 5.0–8.0)

## 2021-08-07 LAB — CBC WITH DIFFERENTIAL/PLATELET
Abs Immature Granulocytes: 0.17 10*3/uL — ABNORMAL HIGH (ref 0.00–0.07)
Basophils Absolute: 0.1 10*3/uL (ref 0.0–0.1)
Basophils Relative: 0 %
Eosinophils Absolute: 0.1 10*3/uL (ref 0.0–0.5)
Eosinophils Relative: 0 %
HCT: 46.4 % (ref 39.0–52.0)
Hemoglobin: 14.9 g/dL (ref 13.0–17.0)
Immature Granulocytes: 1 %
Lymphocytes Relative: 3 %
Lymphs Abs: 0.7 10*3/uL (ref 0.7–4.0)
MCH: 29.5 pg (ref 26.0–34.0)
MCHC: 32.1 g/dL (ref 30.0–36.0)
MCV: 91.9 fL (ref 80.0–100.0)
Monocytes Absolute: 0.8 10*3/uL (ref 0.1–1.0)
Monocytes Relative: 3 %
Neutro Abs: 23.8 10*3/uL — ABNORMAL HIGH (ref 1.7–7.7)
Neutrophils Relative %: 93 %
Platelets: 263 10*3/uL (ref 150–400)
RBC: 5.05 MIL/uL (ref 4.22–5.81)
RDW: 15.5 % (ref 11.5–15.5)
WBC: 25.6 10*3/uL — ABNORMAL HIGH (ref 4.0–10.5)
nRBC: 0 % (ref 0.0–0.2)

## 2021-08-07 LAB — COMPREHENSIVE METABOLIC PANEL
ALT: 26 U/L (ref 0–44)
AST: 23 U/L (ref 15–41)
Albumin: 3.8 g/dL (ref 3.5–5.0)
Alkaline Phosphatase: 85 U/L (ref 38–126)
Anion gap: 11 (ref 5–15)
BUN: 21 mg/dL (ref 8–23)
CO2: 27 mmol/L (ref 22–32)
Calcium: 9.5 mg/dL (ref 8.9–10.3)
Chloride: 97 mmol/L — ABNORMAL LOW (ref 98–111)
Creatinine, Ser: 1.16 mg/dL (ref 0.61–1.24)
GFR, Estimated: >60 mL/min (ref >60)
Glucose, Bld: 175 mg/dL — ABNORMAL HIGH (ref 70–99)
Potassium: 4.4 mmol/L (ref 3.5–5.1)
Sodium: 135 mmol/L (ref 135–145)
Total Bilirubin: 1 mg/dL (ref 0.3–1.2)
Total Protein: 7.8 g/dL (ref 6.5–8.1)

## 2021-08-07 LAB — I-STAT CHEM 8, ED
BUN: 24 mg/dL — ABNORMAL HIGH (ref 8–23)
Calcium, Ion: 1.1 mmol/L — ABNORMAL LOW (ref 1.15–1.40)
Chloride: 100 mmol/L (ref 98–111)
Creatinine, Ser: 1 mg/dL (ref 0.61–1.24)
Glucose, Bld: 167 mg/dL — ABNORMAL HIGH (ref 70–99)
HCT: 47 % (ref 39.0–52.0)
Hemoglobin: 16 g/dL (ref 13.0–17.0)
Potassium: 4.2 mmol/L (ref 3.5–5.1)
Sodium: 135 mmol/L (ref 135–145)
TCO2: 28 mmol/L (ref 22–32)

## 2021-08-07 LAB — APTT: aPTT: 29 seconds (ref 24–36)

## 2021-08-07 LAB — I-STAT VENOUS BLOOD GAS, ED
Acid-Base Excess: 4 mmol/L — ABNORMAL HIGH (ref 0.0–2.0)
Bicarbonate: 30.1 mmol/L — ABNORMAL HIGH (ref 20.0–28.0)
Calcium, Ion: 1.1 mmol/L — ABNORMAL LOW (ref 1.15–1.40)
HCT: 46 % (ref 39.0–52.0)
Hemoglobin: 15.6 g/dL (ref 13.0–17.0)
O2 Saturation: 98 %
Potassium: 4.3 mmol/L (ref 3.5–5.1)
Sodium: 136 mmol/L (ref 135–145)
TCO2: 32 mmol/L (ref 22–32)
pCO2, Ven: 47.1 mmHg (ref 44.0–60.0)
pH, Ven: 7.414 (ref 7.250–7.430)
pO2, Ven: 105 mmHg — ABNORMAL HIGH (ref 32.0–45.0)

## 2021-08-07 LAB — PROTIME-INR
INR: 1 (ref 0.8–1.2)
Prothrombin Time: 12.9 seconds (ref 11.4–15.2)

## 2021-08-07 LAB — RESP PANEL BY RT-PCR (FLU A&B, COVID) ARPGX2
Influenza A by PCR: NEGATIVE
Influenza B by PCR: NEGATIVE
SARS Coronavirus 2 by RT PCR: NEGATIVE

## 2021-08-07 LAB — LACTIC ACID, PLASMA: Lactic Acid, Venous: 2.2 mmol/L (ref 0.5–1.9)

## 2021-08-07 LAB — BRAIN NATRIURETIC PEPTIDE: B Natriuretic Peptide: 46.5 pg/mL (ref 0.0–100.0)

## 2021-08-07 MED ORDER — ARIPIPRAZOLE 5 MG PO TABS
5.0000 mg | ORAL_TABLET | Freq: Every day | ORAL | Status: DC
  Administered 2021-08-08 – 2021-08-12 (×5): 5 mg via ORAL
  Filled 2021-08-07 (×5): qty 1

## 2021-08-07 MED ORDER — ACETAMINOPHEN 325 MG PO TABS
650.0000 mg | ORAL_TABLET | Freq: Once | ORAL | Status: AC
  Administered 2021-08-07: 650 mg via ORAL
  Filled 2021-08-07: qty 2

## 2021-08-07 MED ORDER — SODIUM CHLORIDE 0.9 % IV SOLN
500.0000 mg | INTRAVENOUS | Status: AC
  Administered 2021-08-07 – 2021-08-11 (×5): 500 mg via INTRAVENOUS
  Filled 2021-08-07 (×5): qty 5

## 2021-08-07 MED ORDER — METOPROLOL SUCCINATE ER 50 MG PO TB24
50.0000 mg | ORAL_TABLET | Freq: Every day | ORAL | Status: DC
  Administered 2021-08-08 – 2021-08-12 (×5): 50 mg via ORAL
  Filled 2021-08-07: qty 1
  Filled 2021-08-07: qty 2
  Filled 2021-08-07 (×3): qty 1

## 2021-08-07 MED ORDER — TEMAZEPAM 15 MG PO CAPS: 15.0000 mg | ORAL_CAPSULE | Freq: Every day | ORAL | Status: DC

## 2021-08-07 MED ORDER — ALBUTEROL SULFATE (2.5 MG/3ML) 0.083% IN NEBU: 2.5000 mg | INHALATION_SOLUTION | RESPIRATORY_TRACT | Status: DC | PRN

## 2021-08-07 MED ORDER — ATORVASTATIN CALCIUM 40 MG PO TABS
40.0000 mg | ORAL_TABLET | Freq: Every evening | ORAL | Status: DC
  Administered 2021-08-08 – 2021-08-11 (×4): 40 mg via ORAL
  Filled 2021-08-07 (×4): qty 1

## 2021-08-07 MED ORDER — PREGABALIN 75 MG PO CAPS
150.0000 mg | ORAL_CAPSULE | Freq: Three times a day (TID) | ORAL | Status: DC
  Administered 2021-08-08 – 2021-08-11 (×11): 150 mg via ORAL
  Filled 2021-08-07 (×11): qty 2

## 2021-08-07 MED ORDER — POLYETHYLENE GLYCOL 3350 17 G PO PACK: 17.0000 g | PACK | Freq: Every day | ORAL | Status: DC | PRN

## 2021-08-07 MED ORDER — UMECLIDINIUM BROMIDE 62.5 MCG/ACT IN AEPB
1.0000 | INHALATION_SPRAY | Freq: Every day | RESPIRATORY_TRACT | Status: DC
  Administered 2021-08-08: 1 via RESPIRATORY_TRACT
  Filled 2021-08-07: qty 7

## 2021-08-07 MED ORDER — SODIUM CHLORIDE 0.9 % IV SOLN
2.0000 g | Freq: Once | INTRAVENOUS | Status: AC
  Administered 2021-08-07: 2 g via INTRAVENOUS
  Filled 2021-08-07: qty 2

## 2021-08-07 MED ORDER — SPIRONOLACTONE 25 MG PO TABS
25.0000 mg | ORAL_TABLET | Freq: Every day | ORAL | Status: DC
  Administered 2021-08-08 – 2021-08-12 (×5): 25 mg via ORAL
  Filled 2021-08-07 (×5): qty 1

## 2021-08-07 MED ORDER — IPRATROPIUM-ALBUTEROL 0.5-2.5 (3) MG/3ML IN SOLN
3.0000 mL | Freq: Four times a day (QID) | RESPIRATORY_TRACT | Status: DC
  Administered 2021-08-08 – 2021-08-09 (×6): 3 mL via RESPIRATORY_TRACT
  Filled 2021-08-07 (×6): qty 3

## 2021-08-07 MED ORDER — FUROSEMIDE 20 MG PO TABS
80.0000 mg | ORAL_TABLET | Freq: Two times a day (BID) | ORAL | Status: DC
  Administered 2021-08-08: 80 mg via ORAL
  Filled 2021-08-07: qty 4

## 2021-08-07 MED ORDER — SERTRALINE HCL 50 MG PO TABS: 50.0000 mg | ORAL_TABLET | Freq: Every day | ORAL | Status: DC

## 2021-08-07 MED ORDER — APIXABAN 5 MG PO TABS
5.0000 mg | ORAL_TABLET | Freq: Two times a day (BID) | ORAL | Status: DC
  Administered 2021-08-08 – 2021-08-12 (×9): 5 mg via ORAL
  Filled 2021-08-07 (×10): qty 1

## 2021-08-07 MED ORDER — ACETAMINOPHEN 650 MG RE SUPP: 650.0000 mg | Freq: Four times a day (QID) | RECTAL | Status: DC | PRN

## 2021-08-07 MED ORDER — TEMAZEPAM 15 MG PO CAPS: 15.0000 mg | ORAL_CAPSULE | Freq: Every evening | ORAL | Status: DC | PRN

## 2021-08-07 MED ORDER — PRAZOSIN HCL 2 MG PO CAPS
2.0000 mg | ORAL_CAPSULE | Freq: Every day | ORAL | Status: DC
  Administered 2021-08-08 – 2021-08-11 (×4): 2 mg via ORAL
  Filled 2021-08-07 (×8): qty 1

## 2021-08-07 MED ORDER — LACTATED RINGERS IV BOLUS (SEPSIS)
3000.0000 mL | Freq: Once | INTRAVENOUS | Status: AC
  Administered 2021-08-07: 3000 mL via INTRAVENOUS

## 2021-08-07 MED ORDER — ACETAMINOPHEN 325 MG PO TABS
650.0000 mg | ORAL_TABLET | Freq: Four times a day (QID) | ORAL | Status: DC | PRN
  Administered 2021-08-09 – 2021-08-12 (×3): 650 mg via ORAL
  Filled 2021-08-07 (×4): qty 2

## 2021-08-07 MED ORDER — PANTOPRAZOLE SODIUM 40 MG PO TBEC
40.0000 mg | DELAYED_RELEASE_TABLET | Freq: Every day | ORAL | Status: DC
  Administered 2021-08-08 – 2021-08-12 (×5): 40 mg via ORAL
  Filled 2021-08-07 (×5): qty 1

## 2021-08-07 MED ORDER — ALBUTEROL SULFATE HFA 108 (90 BASE) MCG/ACT IN AERS: 2.0000 | INHALATION_SPRAY | Freq: Four times a day (QID) | RESPIRATORY_TRACT | Status: DC | PRN

## 2021-08-07 MED ORDER — TRAZODONE HCL 50 MG PO TABS
200.0000 mg | ORAL_TABLET | Freq: Every day | ORAL | Status: DC
  Administered 2021-08-08 – 2021-08-11 (×5): 200 mg via ORAL
  Filled 2021-08-07 (×5): qty 4

## 2021-08-07 MED ORDER — TEMAZEPAM 15 MG PO CAPS
15.0000 mg | ORAL_CAPSULE | Freq: Once | ORAL | Status: AC
  Administered 2021-08-08: 15 mg via ORAL
  Filled 2021-08-07: qty 1

## 2021-08-07 MED ORDER — SODIUM CHLORIDE 0.9% FLUSH
3.0000 mL | Freq: Two times a day (BID) | INTRAVENOUS | Status: DC
  Administered 2021-08-08 – 2021-08-12 (×9): 3 mL via INTRAVENOUS

## 2021-08-07 MED ORDER — SODIUM CHLORIDE 0.9 % IV SOLN
2.0000 g | Freq: Three times a day (TID) | INTRAVENOUS | Status: DC
  Administered 2021-08-08 – 2021-08-11 (×10): 2 g via INTRAVENOUS
  Filled 2021-08-07 (×10): qty 2

## 2021-08-07 MED ORDER — INSULIN ASPART 100 UNIT/ML IJ SOLN
0.0000 [IU] | Freq: Three times a day (TID) | INTRAMUSCULAR | Status: DC
  Administered 2021-08-08: 3 [IU] via SUBCUTANEOUS
  Administered 2021-08-08 – 2021-08-10 (×7): 4 [IU] via SUBCUTANEOUS
  Administered 2021-08-10: 7 [IU] via SUBCUTANEOUS
  Administered 2021-08-11 (×3): 4 [IU] via SUBCUTANEOUS
  Administered 2021-08-12: 3 [IU] via SUBCUTANEOUS
  Administered 2021-08-12: 4 [IU] via SUBCUTANEOUS

## 2021-08-07 MED ORDER — LACTATED RINGERS IV SOLN: INTRAVENOUS | Status: DC

## 2021-08-07 MED ORDER — ARFORMOTEROL TARTRATE 15 MCG/2ML IN NEBU
15.0000 ug | INHALATION_SOLUTION | Freq: Two times a day (BID) | RESPIRATORY_TRACT | Status: DC
  Administered 2021-08-08 – 2021-08-12 (×8): 15 ug via RESPIRATORY_TRACT
  Filled 2021-08-07 (×10): qty 2

## 2021-08-07 NOTE — Progress Notes (Signed)
Pharmacy Antibiotic Note  Lawrence [name] is a 65 y.o. male admitted on 08/07/2021 with sepsis.  Pharmacy has been consulted for cefepime dosing. Patient presenting with AMS.  CXR with bilateral lower lung airspace disease worrisome for pna or edema  SCr 1.04  WBC 25.6; T 102.4 F  Plan: Azithromycin 500 mg daily x 5 days Cefepime 2g q8hr Trend WBC, Fever, Renal function, & Clinical course F/u cultures, clinical course, WBC, fever De-escalate when able     Temp (24hrs), Avg:102.4 F (39.1 C), Min:102.4 F (39.1 C), Max:102.4 F (39.1 C)  No results for input(s): WBC, CREATININE, LATICACIDVEN, VANCOTROUGH, VANCOPEAK, VANCORANDOM, GENTTROUGH, GENTPEAK, GENTRANDOM, TOBRATROUGH, TOBRAPEAK, TOBRARND, AMIKACINPEAK, AMIKACINTROU, AMIKACIN in the last 168 hours.  CrCl cannot be calculated (Unknown ideal weight.).    Allergies  Allergen Reactions   Vancomycin Other (See Comments)    "Red Man Syndrome" 02/02/17: possible cause for rash under both arms   Niacin Other (See Comments)   Niacin And Related Other (See Comments)    Red man syndrome   Tubersol [Tuberculin, Ppd] Other (See Comments)    Reaction unknown   Doxycycline Rash and Other (See Comments)    Antimicrobials this admission: cefepime 12/31 >>  Azithromycin 12/31 >> (1/5)  Microbiology results: Pending  Thank you for allowing pharmacy to be a part of this patients care.  Lorelei Pont, PharmD, BCPS 08/07/2021 6:58 PM ED Clinical Pharmacist -  [phone]

## 2021-08-07 NOTE — Progress Notes (Signed)
Pt being followed by [facility] for Sepsis protocol. 

## 2021-08-07 NOTE — ED Notes (Signed)
Pt placed on 3L [place]

## 2021-08-07 NOTE — ED Notes (Signed)
Provider aware of critical lactic 

## 2021-08-07 NOTE — ED Triage Notes (Signed)
Pt from home via [facility], called by family for AMS since 65. Slower to respond than normal, does not know day/month/year. Family also noticed gradually increasing weakness since noon, usually independent w walker. Episode of incontinence d/t not being able to get up from chair. Hx stroke, stroke screen clear by EMS.   VSS 150/72 HR 93% RA, hx COPD, @ baseline CBG 121

## 2021-08-07 NOTE — ED Provider Notes (Signed)
[facility] EMERGENCY DEPARTMENT Provider Note   CSN: [id number] Arrival date & time: 08/07/21  1824     History Chief Complaint  Patient presents with   Altered Mental Status    Lawrence [name] is a 65 y.o. male with past medical history significant for aortic stenosis, HFpEF, chronic venous stasis, COPD, CAD, DM, obesity, OSA, HLD who presents with fever and altered mental status.  EMS was called by family for confusion and worsening generalized weakness.  Patient is usually fully alert and oriented and ambulates with a walker but has not been able to do so over the past day.  He also had an episode of incontinence due to not being able to get up from his chair.  The patient gives inconsistent answers to questions during my interview and is unable to provide any reliable history.  Per family at bedside he started becoming confused around noon today.  They did not check a temperature at home.     Past Medical History:  Diagnosis Date   Acquired dilation of ascending aorta and aortic root ([place])    78mm by echo 01/2021   Adenomatous colon polyp 2007   Anemia    Anxiety    Aortic stenosis    moderate AS by echo 01/2021   Asthma    BPH without obstruction/lower urinary tract symptoms 02/22/2017   Chronic diastolic (congestive) heart failure (HCC)    Chronic venous stasis 03/07/2019   COPD (chronic obstructive pulmonary disease) (HCC)    Coronary artery calcification seen on CAT scan    Depression    Diabetic neuropathy ([place]) 09/11/2019   History of colon polyps 08/24/2018   Hypertension    Morbid obesity (HCC)    OSA (obstructive sleep apnea)    Pain due to onychomycosis of toenails of both feet 09/11/2019   Peripheral neuropathy 02/22/2017   Primary osteoarthritis, left shoulder 03/05/2017   PTSD (post-traumatic stress disorder)    Pure hypercholesterolemia    QT prolongation 03/07/2019   Seizures (HCC)    Shortness of breath    Sinus tachycardia  03/07/2019   Sleep apnea    CPAP   Type 2 diabetes mellitus with vascular disease ([place]) 09/11/2019    Patient Active Problem List   Diagnosis Date Noted   Depression 08/07/2021   Severe sepsis ([place]) 08/07/2021   Paronychia of great toe, left 07/07/2021   Stroke-like symptoms 04/27/2021   Ambulatory dysfunction 04/27/2021   Left-sided weakness 02/09/2021   Atrial fibrillation with rapid ventricular response ([place]) 02/08/2021   Acute left-sided weakness 01/25/2021   AF (paroxysmal atrial fibrillation) ([place]) 01/25/2021   COPD (chronic obstructive pulmonary disease) ([place]) 01/25/2021   Moderate aortic stenosis 01/21/2021   Acquired dilation of ascending aorta and aortic root ([place]) 01/21/2021   Coronary artery disease due to lipid rich plaque    SVT (supraventricular tachycardia) (HCC)    Partial seizure ([place])    Acute CHF (congestive heart failure) ([place]) 10/17/2020   Essential hypertension 10/17/2020   Hypokalemia 10/17/2020   Cellulitis of right leg 12/04/2019   Pain due to onychomycosis of toenails of both feet 09/11/2019   Diabetic neuropathy ([place]) 09/11/2019   Type 2 diabetes mellitus with diabetic polyneuropathy, with long-term current use of insulin ([place]) 09/11/2019   Sinus tachycardia 03/07/2019   Chronic venous stasis 03/07/2019   History of anemia 12/26/2018   Anemia 08/24/2018   History of colon polyps 08/24/2018   Do not resuscitate status 08/24/2018   Chronic diastolic CHF (  congestive heart failure) ([place]) 08/18/2018   Bilateral lower leg cellulitis    Coronary artery calcification seen on CAT scan    Pure hypercholesterolemia    Chest pain    Shortness of breath    Acute respiratory failure with hypoxia ([place]) 01/13/2018   Right foot pain 01/13/2018   Acute on chronic diastolic CHF (congestive heart failure) ([place]) 01/13/2018   Diabetes mellitus type 2 in obese ([place]) 01/13/2018   Lower extremity cellulitis 11/07/2017   Primary osteoarthritis, left shoulder  03/05/2017   BPH without obstruction/lower urinary tract symptoms 02/22/2017   Hematuria 02/22/2017   Peripheral neuropathy 02/22/2017   Physical deconditioning 02/22/2017   Morbid obesity ([place])    PTSD (post-traumatic stress disorder)    Anasarca 01/31/2017   Bilateral lower extremity edema 01/31/2017   OSA (obstructive sleep apnea)     Past Surgical History:  Procedure Laterality Date   ENDOVENOUS ABLATION SAPHENOUS VEIN W/ LASER Right 08/20/2020   endovenous laser ablation right greater saphenous vein by Gae Gallop MD    JOINT REPLACEMENT     left knee replacement x 2   KNEE ARTHROSCOPY Bilateral    LEFT HEART CATH AND CORONARY ANGIOGRAPHY N/A 01/17/2018   Procedure: LEFT HEART CATH AND CORONARY ANGIOGRAPHY;  Surgeon: Leonie Man, MD;  Location: [facility];  Service: Cardiovascular;  Laterality: N/A;   UMBILICAL HERNIA REPAIR         Family History  Problem Relation Age of Onset   CAD Maternal Grandfather    Diabetes Other    Diabetes Mellitus II Neg Hx    Colon cancer Neg Hx    Esophageal cancer Neg Hx    Inflammatory bowel disease Neg Hx    Liver disease Neg Hx    Pancreatic cancer Neg Hx    Rectal cancer Neg Hx    Stomach cancer Neg Hx    Sleep apnea Neg Hx     Social History   Tobacco Use   Smoking status: Former    Packs/day: 1.00    Years: 44.00    Pack years: 44.00    Types: Cigarettes    Quit date: 03/07/2016    Years since quitting: 5.4    Passive exposure: Past   Smokeless tobacco: Never  Vaping Use   Vaping Use: Never used  Substance Use Topics   Alcohol use: Yes    Comment: rare   Drug use: No    Home Medications Prior to Admission medications   Medication Sig Start Date End Date Taking? Authorizing Provider  Accu-Chek FastClix Lancets MISC 1 each by Other route in the morning and at bedtime. 12/22/20   [provider]  acetaminophen (TYLENOL) 325 MG tablet Take 2 tablets (650 mg total) by mouth every 4 (four) hours  as needed for mild pain (or temp > 37.5 C (99.5 F)). 01/26/21   Cherene Altes, MD  albuterol (VENTOLIN HFA) 108 (90 Base) MCG/ACT inhaler Inhale 2 puffs into the lungs every 6 (six) hours as needed for wheezing or shortness of breath. 03/10/20   [provider]  Alcohol Swabs (ALCOHOL PREP) 70 % PADS 1 each by Other route in the morning, at noon, and at bedtime. 12/22/20   [provider]  allopurinol (ZYLOPRIM) 100 MG tablet Take 200 mg by mouth daily.    [provider]  apixaban (ELIQUIS) 5 MG TABS tablet Take 1 tablet (5 mg total) by mouth 2 (two) times daily. 06/24/21   Sueanne Margarita,  MD  ARIPiprazole (ABILIFY) 5 MG tablet TAKE ONE TABLET BY MOUTH EVERYDAY AT BEDTIME 07/13/21   Mozingo, Berdie Ogren, NP  atorvastatin (LIPITOR) 40 MG tablet Take 1 tablet (40 mg total) by mouth every evening. TAKE ONE TABLET BY MOUTH DAILY AT 6PM 06/24/21   Sueanne Margarita, MD  B-D ULTRAFINE III SHORT PEN 31G X 8 MM MISC 1 each by Other route See admin instructions. Use to inject insulin subcutaneously at bedtime as directed. 11/20/20   [provider]  blood glucose meter kit and supplies Dispense based on patient and insurance preference. Use up to four times daily as directed. (FOR ICD-10 E10.9, E11.9). 11/09/17   Eugenie Filler, MD  busPIRone (BUSPAR) 5 MG tablet TAKE ONE TABLET BY MOUTH THREE TIMES DAILY Patient taking differently: Take 5 mg by mouth 3 (three) times daily. 04/20/21   Mozingo, Berdie Ogren, NP  cetirizine (ZYRTEC) 10 MG tablet Take 10 mg by mouth at bedtime.     [provider]  Cholecalciferol (VITAMIN D3 PO) Take 2,000 Units by mouth daily.    [provider]  dapagliflozin propanediol (FARXIGA) 10 MG TABS tablet Take 1 tablet (10 mg total) by mouth daily before breakfast. 01/05/21   Renato Shin, MD  diltiazem (CARDIZEM CD) 180 MG 24 hr capsule Take 1 capsule (180 mg total) by mouth daily. 06/24/21   Turner, Eber Hong, MD   Dulaglutide (TRULICITY) 4.5 VV/6.1YW SOPN Inject 4.5 mg as directed once a week. Patient taking differently: Inject 4.5 mg as directed every Monday. 11/19/20   Renato Shin, MD  ferrous sulfate 324 (65 Fe) MG TBEC Take 324 mg by mouth daily with breakfast.     [provider]  fluticasone (FLONASE) 50 MCG/ACT nasal spray Place 2 sprays into both nostrils daily.     [provider]  isosorbide mononitrate (IMDUR) 30 MG 24 hr tablet Take 1 tablet (30 mg total) by mouth daily. 06/24/21   Sueanne Margarita, MD  metFORMIN (GLUCOPHAGE-XR) 500 MG 24 hr tablet Take 4 tablets (2,000 mg total) by mouth daily. 06/16/21   Renato Shin, MD  metoprolol succinate (TOPROL-XL) 50 MG 24 hr tablet Take 1 tablet (50 mg total) by mouth daily. Take with or immediately following a meal. 06/24/21 09/22/21  Turner, Eber Hong, MD  omeprazole (PRILOSEC) 20 MG capsule Take 20 mg by mouth every morning. 05/29/20   [provider]  potassium chloride SA (KLOR-CON M) 20 MEQ tablet Take 2 tablets (40 mEq total) by mouth every morning AND 1 tablet (20 mEq total) every evening. 07/13/21   Larey Dresser, MD  prazosin (MINIPRESS) 2 MG capsule TAKE TWO CAPSULES BY MOUTH EVERYDAY AT BEDTIME 07/08/21   Sueanne Margarita, MD  pregabalin (LYRICA) 150 MG capsule Take 1 capsule (150 mg total) by mouth 3 (three) times daily. Please discuss future refills with your PCP. 07/12/21   Marcial Pacas, MD  repaglinide (PRANDIN) 2 MG tablet Take 2 tablets (4 mg total) by mouth 2 (two) times daily before a meal. 07/15/21   Renato Shin, MD  spironolactone (ALDACTONE) 25 MG tablet Take 1 tablet (25 mg total) by mouth daily. 06/24/21   Sueanne Margarita, MD  temazepam (RESTORIL) 15 MG capsule TAKE ONE CAPSULE BY MOUTH EVERYDAY AT BEDTIME 06/23/21   Mozingo, Berdie Ogren, NP  Tiotropium Bromide-Olodaterol (STIOLTO RESPIMAT) 2.5-2.5 MCG/ACT AERS Inhale 2 puffs into the lungs daily. 07/19/21   Hunsucker, Bonna Gains, MD  torsemide (DEMADEX)  20 MG tablet Take  3 tablets (60 mg total) by mouth every morning AND 2 tablets (40 mg total) every evening. 07/13/21   Larey Dresser, MD  traZODone (DESYREL) 100 MG tablet Take 2 tablets (200 mg total) by mouth at bedtime. 12/31/20   Mozingo, Berdie Ogren, NP  venlafaxine XR (EFFEXOR-XR) 75 MG 24 hr capsule Take 3 capsules (225 mg total) by mouth daily with breakfast. 12/10/20   Mozingo, Berdie Ogren, NP  vitamin B-12 1000 MCG tablet Take 1 tablet (1,000 mcg total) by mouth daily. 04/28/21   Mercy Riding, MD    Allergies    Vancomycin; Niacin; Niacin and related; Tubersol [tuberculin, ppd]; and Doxycycline  Review of Systems   Review of Systems  Unable to perform ROS: Mental status change   Physical Exam Updated Vital Signs BP 131/66    Pulse (!) 102    Temp (!) 102.4 F (39.1 C) (Oral)    Resp 19    SpO2 94%   Physical Exam Vitals and nursing note reviewed.  Constitutional:      General: He is in acute distress.     Appearance: He is well-developed. He is obese. He is ill-appearing.  HENT:     Head: Normocephalic and atraumatic.     Right Ear: External ear normal.     Left Ear: External ear normal.     Nose: Nose normal.     Mouth/Throat:     Mouth: Mucous membranes are moist.     Pharynx: Oropharynx is clear.  Eyes:     Extraocular Movements: Extraocular movements intact.     Conjunctiva/sclera: Conjunctivae normal.     Pupils: Pupils are equal, round, and reactive to light.  Cardiovascular:     Rate and Rhythm: Normal rate and regular rhythm.     Pulses: Normal pulses.     Heart sounds: Normal heart sounds. No murmur heard. Pulmonary:     Effort: Pulmonary effort is normal. No respiratory distress.     Breath sounds: Examination of the right-lower field reveals decreased breath sounds. Examination of the left-lower field reveals decreased breath sounds. Decreased breath sounds present.  Abdominal:     Palpations: Abdomen is soft.     Tenderness: There is no  abdominal tenderness.  Musculoskeletal:     Cervical back: Neck supple.     Right lower leg: Edema present.     Left lower leg: Edema present.     Comments: Chronic venous stasis of bilateral lower extremities with non-purulent wound and surrounding erythema on the LLE  Skin:    General: Skin is warm and dry.     Capillary Refill: Capillary refill takes less than 2 seconds.  Neurological:     General: No focal deficit present.     Mental Status: He is alert.     Comments: Intermittently able to answer orientation questions correctly.  Psychiatric:        Mood and Affect: Mood normal.    ED Results / Procedures / Treatments   Labs (all labs ordered are listed, but only abnormal results are displayed) Labs Reviewed  LACTIC ACID, PLASMA - Abnormal; Notable for the following components:      Result Value   Lactic Acid, Venous 2.2 (*)    All other components within normal limits  COMPREHENSIVE METABOLIC PANEL - Abnormal; Notable for the following components:   Chloride 97 (*)    Glucose, Bld 175 (*)    All other components within normal limits  CBC WITH DIFFERENTIAL/PLATELET - Abnormal;  Notable for the following components:   WBC 25.6 (*)    Neutro Abs 23.8 (*)    Abs Immature Granulocytes 0.17 (*)    All other components within normal limits  URINALYSIS, ROUTINE W REFLEX MICROSCOPIC - Abnormal; Notable for the following components:   Glucose, UA >=500 (*)    Hgb urine dipstick SMALL (*)    All other components within normal limits  I-STAT CHEM 8, ED - Abnormal; Notable for the following components:   BUN 24 (*)    Glucose, Bld 167 (*)    Calcium, Ion 1.10 (*)    All other components within normal limits  I-STAT VENOUS BLOOD GAS, ED - Abnormal; Notable for the following components:   pO2, Ven 105.0 (*)    Bicarbonate 30.1 (*)    Acid-Base Excess 4.0 (*)    Calcium, Ion 1.10 (*)    All other components within normal limits  RESP PANEL BY RT-PCR (FLU A&B, COVID) [id number]   CULTURE, BLOOD (ROUTINE X 2)  CULTURE, BLOOD (ROUTINE X 2)  URINE CULTURE  PROTIME-INR  APTT  BRAIN NATRIURETIC PEPTIDE  LACTIC ACID, PLASMA  COMPREHENSIVE METABOLIC PANEL  CBC  PROTIME-INR  PROCALCITONIN  PROCALCITONIN    EKG EKG Interpretation  Date/Time:  Saturday August 07 2021 18:33:33 EST Ventricular Rate:  109 PR Interval:  176 QRS Duration: 114 QT Interval:  327 QTC Calculation: 441 R Axis:   -32 Text Interpretation: Sinus tachycardia Borderline IVCD with LAD Inferior infarct, old No significant change since last tracing Confirmed by Fredia Sorrow [phone]) on 08/07/2021 7:03:42 PM  Radiology CT Head Wo Contrast  Result Date: 08/07/2021 CLINICAL DATA:  Mental status changes of unknown cause. EXAM: CT HEAD WITHOUT CONTRAST TECHNIQUE: Contiguous axial images were obtained from the base of the skull through the vertex without intravenous contrast. COMPARISON:  Head CT 04/27/2021. FINDINGS: Brain: No evidence of acute infarction, hemorrhage, hydrocephalus, extra-axial collection or mass lesion/mass effect. There is early cerebral atrophy and small vessel disease. Vascular: There are mild calcifications in the carotid siphons without hyperdense central vessels. Skull: Normal. Negative for fracture or focal lesion. Sinuses/Orbits: No acute finding. There is mild chronic membrane thickening in the maxillary and ethmoid sinuses without fluid level. Other sinuses and the bilateral mastoid air cells are clear. Other: None. IMPRESSION: Chronic changes. No acute intracranial CT findings or interval changes. Electronically Signed   By: Telford Nab M.D.   On: 08/07/2021 21:15   DG Chest Port 1 View  Result Date: 08/07/2021 CLINICAL DATA:  Questionable sepsis. EXAM: PORTABLE CHEST 1 VIEW COMPARISON:  Chest x-ray 02/08/2021. FINDINGS: There are patchy airspace opacities in the bilateral lung bases. There is no pleural effusion or pneumothorax identified. The cardiomediastinal  silhouette is stable, the heart is enlarged. No acute fractures are seen. IMPRESSION: 1. Bilateral lower lung airspace disease worrisome for pneumonia or edema. 2. Stable cardiomegaly. Electronically Signed   By: Ronney Asters M.D.   On: 08/07/2021 19:33    Procedures Procedures   Medications Ordered in ED Medications  lactated ringers infusion (has no administration in time range)  azithromycin (ZITHROMAX) 500 mg in sodium chloride 0.9 % 250 mL IVPB (0 mg Intravenous Stopped 08/07/21 2228)  ceFEPIme (MAXIPIME) 2 g in sodium chloride 0.9 % 100 mL IVPB (has no administration in time range)  atorvastatin (LIPITOR) tablet 40 mg (has no administration in time range)  metoprolol succinate (TOPROL-XL) 24 hr tablet 50 mg (has no administration in time range)  spironolactone (ALDACTONE) tablet 25  mg (has no administration in time range)  prazosin (MINIPRESS) capsule 2 mg (has no administration in time range)  pantoprazole (PROTONIX) EC tablet 40 mg (has no administration in time range)  arformoterol (BROVANA) nebulizer solution 15 mcg (has no administration in time range)    And  umeclidinium bromide (INCRUSE ELLIPTA) 62.5 MCG/ACT 1 puff (has no administration in time range)  pregabalin (LYRICA) capsule 150 mg (has no administration in time range)  temazepam (RESTORIL) capsule 15 mg (has no administration in time range)  traZODone (DESYREL) tablet 200 mg (has no administration in time range)  ARIPiprazole (ABILIFY) tablet 5 mg (has no administration in time range)  sertraline (ZOLOFT) tablet 50 mg (has no administration in time range)  furosemide (LASIX) tablet 80 mg (has no administration in time range)  sodium chloride flush (NS) 0.9 % injection 3 mL (has no administration in time range)  acetaminophen (TYLENOL) tablet 650 mg (has no administration in time range)    Or  acetaminophen (TYLENOL) suppository 650 mg (has no administration in time range)  polyethylene glycol (MIRALAX / GLYCOLAX)  packet 17 g (has no administration in time range)  ipratropium-albuterol (DUONEB) 0.5-2.5 (3) MG/3ML nebulizer solution 3 mL (has no administration in time range)  albuterol (PROVENTIL) (2.5 MG/3ML) 0.083% nebulizer solution 2.5 mg (has no administration in time range)  lactated ringers bolus 3,000 mL (3,000 mLs Intravenous New Bag/Given 08/07/21 1910)  ceFEPIme (MAXIPIME) 2 g in sodium chloride 0.9 % 100 mL IVPB (0 g Intravenous Stopped 08/07/21 1949)  acetaminophen (TYLENOL) tablet 650 mg (650 mg Oral Given 08/07/21 2118)    ED Course  I have reviewed the triage vital signs and the nursing notes.  Pertinent labs & imaging results that were available during my care of the patient were reviewed by me and considered in my medical decision making (see chart for details).    MDM Rules/Calculators/A&P                          Patient presents with altered mental status from her menstrual cramping HPI above.  Upon initial evaluation, the patient is febrile to 102, hemodynamically stable, and in mild respiratory distress requiring 5 L nasal cannula to maintain O2 sats in the 90s.  Physical exam with findings as described above.  Suspect sepsis secondary to respiratory source due to hypoxia.  Cellulitis of the left lower extremity also a possible source.  No purulent drainage to suggest MRSA.  Per chart review, patient does not have history of MRSA infections.  Will cover empirically for pneumonia and not MRSA cellulitis with cefepime.  Patient has reported "red man" syndrome reaction to vancomycin.  Fluid resuscitation initiated with 3 L to start.  Will not do full 30 cc/kg bolus due to patient's history of HFpEF.  Sepsis labs, CXR, CT head ordered.  EKG shows sinus tachycardia with borderline interventricular conduction delay.  No significant changes compared to prior.  CXR with bilateral lower lobe airspace opacities concerning for pneumonia or pulmonary edema.  BNP normal.  Suspect pneumonia.   Azithromycin added to cover atypical organisms.  CT head negative for any acute intracranial abnormalities.  Patient requires admission for sepsis secondary to pneumonia.  I discussed the patient with hospitalist team who will admit the patient.  Final Clinical Impression(s) / ED Diagnoses Final diagnoses:  Sepsis with encephalopathy without septic shock, due to unspecified organism [facility])    Rx / DC Orders ED Discharge Orders  None        Varney Baas, MD 08/08/21 1146    Fredia Sorrow, MD 09/06/21 0001

## 2021-08-07 NOTE — H&P (Signed)
History and Physical   Lawrence [name] OEU:[id number] DOB: [birth year] DOA: 08/07/2021  PCP: [name], PA-C   Patient coming from: Home   Chief Complaint: Fever, altered mental status  HPI: Lawrence [name] is a 65 y.o. male with medical history significant of CHF, atrial fibrillation, SVT, anemia, BPH, venous stasis, COPD, diabetes, hypertension, OSA, partial seizure, PTSD, depression who presents with ongoing altered mental status and fevers.  Due to patient's altered mental status history obtained with assistance of chart review and family.  Patient's family called EMS today due to ongoing confusion and worsening weakness.  At baseline patient is alert and oriented and able to walk with a walker.  He was unable to do this today.  Also had an episode of incontinence because he was unable to get the strength to get out of a chair.  Unable to get  reliable review of systems due to patient's altered mentation.   ED Course: Vital signs in the ED significant for fever to 102.4, heart rate in the 100s, respiratory rate in the 20s, requiring 5 L to maintain saturations.  Lab work-up showed CMP with chloride 97, glucose 175.  CBC with leukocytosis to 25.6 up from a baseline chronic leukocytosis of 11-13.  PT, PTT, INR within normal limits.  Lactic acid initially elevated mildly at 2.2 with repeat pending.  BNP normal.  Rest were panel flu and COVID-negative.  Urinalysis with small hemoglobin only.  Urine cultures and blood cultures are pending.  VBG with normal pH and normal PCO2.  CT head without acute abnormality.  Chest x-ray showing bilateral lower airspace disease concerning for pneumonia versus edema.  Stable cardiomegaly also noted.  Patient started on cefepime and azithromycin in the ED received received a 3 L bolus and started on a rate of fluids.  Also received dose of Tylenol.  Review of Systems:  Unable to get reliable review of systems due to patient's altered mentation.  Past  Medical History:  Diagnosis Date   Acquired dilation of ascending aorta and aortic root ([place])    68m by echo 01/2021   Adenomatous colon polyp 2007   Anemia    Anxiety    Aortic stenosis    moderate AS by echo 01/2021   Asthma    BPH without obstruction/lower urinary tract symptoms 02/22/2017   Chronic diastolic (congestive) heart failure (HCC)    Chronic venous stasis 03/07/2019   COPD (chronic obstructive pulmonary disease) (HCC)    Coronary artery calcification seen on CAT scan    Depression    Diabetic neuropathy ([place] 09/11/2019   History of colon polyps 08/24/2018   Hypertension    Morbid obesity (HCC)    OSA (obstructive sleep apnea)    Pain due to onychomycosis of toenails of both feet 09/11/2019   Peripheral neuropathy 02/22/2017   Primary osteoarthritis, left shoulder 03/05/2017   PTSD (post-traumatic stress disorder)    Pure hypercholesterolemia    QT prolongation 03/07/2019   Seizures (HCC)    Shortness of breath    Sinus tachycardia 03/07/2019   Sleep apnea    CPAP   Type 2 diabetes mellitus with vascular disease ([place] 09/11/2019    Past Surgical History:  Procedure Laterality Date   ENDOVENOUS ABLATION SAPHENOUS VEIN W/ LASER Right 08/20/2020   endovenous laser ablation right greater saphenous vein by CGae GallopMD    JOINT REPLACEMENT     left knee replacement x 2   KNEE ARTHROSCOPY Bilateral    LEFT HEART  CATH AND CORONARY ANGIOGRAPHY N/A 01/17/2018   Procedure: LEFT HEART CATH AND CORONARY ANGIOGRAPHY;  Surgeon: Leonie Man, MD;  Location: [facility];  Service: Cardiovascular;  Laterality: N/A;   UMBILICAL HERNIA REPAIR      Social History  reports that he quit smoking about 5 years ago. His smoking use included cigarettes. He has a 44.00 pack-year smoking history. He has been exposed to tobacco smoke. He has never used smokeless tobacco. He reports current alcohol use. He reports that he does not use drugs.  Allergies  Allergen  Reactions   Vancomycin Other (See Comments)    "Red Man Syndrome" 02/02/17: possible cause for rash under both arms   Niacin Other (See Comments)   Niacin And Related Other (See Comments)    Red man syndrome   Tubersol [Tuberculin, Ppd] Other (See Comments)    Reaction unknown   Doxycycline Rash and Other (See Comments)    Family History  Problem Relation Age of Onset   CAD Maternal Grandfather    Diabetes Other    Diabetes Mellitus II Neg Hx    Colon cancer Neg Hx    Esophageal cancer Neg Hx    Inflammatory bowel disease Neg Hx    Liver disease Neg Hx    Pancreatic cancer Neg Hx    Rectal cancer Neg Hx    Stomach cancer Neg Hx    Sleep apnea Neg Hx   Reviewed on admission  Prior to Admission medications   Medication Sig Start Date End Date Taking? Authorizing Provider  Accu-Chek FastClix Lancets MISC 1 each by Other route in the morning and at bedtime. 12/22/20   [provider]  acetaminophen (TYLENOL) 325 MG tablet Take 2 tablets (650 mg total) by mouth every 4 (four) hours as needed for mild pain (or temp > 37.5 C (99.5 F)). 01/26/21   Cherene Altes, MD  albuterol (VENTOLIN HFA) 108 (90 Base) MCG/ACT inhaler Inhale 2 puffs into the lungs every 6 (six) hours as needed for wheezing or shortness of breath. 03/10/20   [provider]  Alcohol Swabs (ALCOHOL PREP) 70 % PADS 1 each by Other route in the morning, at noon, and at bedtime. 12/22/20   [provider]  allopurinol (ZYLOPRIM) 100 MG tablet Take 200 mg by mouth daily.    [provider]  apixaban (ELIQUIS) 5 MG TABS tablet Take 1 tablet (5 mg total) by mouth 2 (two) times daily. 06/24/21   Sueanne Margarita, MD  ARIPiprazole (ABILIFY) 5 MG tablet TAKE ONE TABLET BY MOUTH EVERYDAY AT BEDTIME 07/13/21   Mozingo, Berdie Ogren, NP  atorvastatin (LIPITOR) 40 MG tablet Take 1 tablet (40 mg total) by mouth every evening. TAKE ONE TABLET BY MOUTH DAILY AT 6PM 06/24/21   Sueanne Margarita, MD  B-D  ULTRAFINE III SHORT PEN 31G X 8 MM MISC 1 each by Other route See admin instructions. Use to inject insulin subcutaneously at bedtime as directed. 11/20/20   [provider]  blood glucose meter kit and supplies Dispense based on patient and insurance preference. Use up to four times daily as directed. (FOR ICD-10 E10.9, E11.9). 11/09/17   Eugenie Filler, MD  busPIRone (BUSPAR) 5 MG tablet TAKE ONE TABLET BY MOUTH THREE TIMES DAILY Patient taking differently: Take 5 mg by mouth 3 (three) times daily. 04/20/21   Mozingo, Berdie Ogren, NP  cetirizine (ZYRTEC) 10 MG tablet Take 10 mg by mouth at bedtime.  [provider]  Cholecalciferol (VITAMIN D3 PO) Take 2,000 Units by mouth daily.    [provider]  dapagliflozin propanediol (FARXIGA) 10 MG TABS tablet Take 1 tablet (10 mg total) by mouth daily before breakfast. 01/05/21   Renato Shin, MD  diltiazem (CARDIZEM CD) 180 MG 24 hr capsule Take 1 capsule (180 mg total) by mouth daily. 06/24/21   Turner, Eber Hong, MD  Dulaglutide (TRULICITY) 4.5 EP/3.2RJ SOPN Inject 4.5 mg as directed once a week. Patient taking differently: Inject 4.5 mg as directed every Monday. 11/19/20   Renato Shin, MD  ferrous sulfate 324 (65 Fe) MG TBEC Take 324 mg by mouth daily with breakfast.     [provider]  fluticasone (FLONASE) 50 MCG/ACT nasal spray Place 2 sprays into both nostrils daily.     [provider]  isosorbide mononitrate (IMDUR) 30 MG 24 hr tablet Take 1 tablet (30 mg total) by mouth daily. 06/24/21   Sueanne Margarita, MD  metFORMIN (GLUCOPHAGE-XR) 500 MG 24 hr tablet Take 4 tablets (2,000 mg total) by mouth daily. 06/16/21   Renato Shin, MD  metoprolol succinate (TOPROL-XL) 50 MG 24 hr tablet Take 1 tablet (50 mg total) by mouth daily. Take with or immediately following a meal. 06/24/21 09/22/21  Turner, Eber Hong, MD  omeprazole (PRILOSEC) 20 MG capsule Take 20 mg by mouth every morning. 05/29/20   [provider]  potassium chloride SA (KLOR-CON M) 20 MEQ tablet Take 2 tablets (40 mEq total) by mouth every morning AND 1 tablet (20 mEq total) every evening. 07/13/21   Larey Dresser, MD  prazosin (MINIPRESS) 2 MG capsule TAKE TWO CAPSULES BY MOUTH EVERYDAY AT BEDTIME 07/08/21   Sueanne Margarita, MD  pregabalin (LYRICA) 150 MG capsule Take 1 capsule (150 mg total) by mouth 3 (three) times daily. Please discuss future refills with your PCP. 07/12/21   Marcial Pacas, MD  repaglinide (PRANDIN) 2 MG tablet Take 2 tablets (4 mg total) by mouth 2 (two) times daily before a meal. 07/15/21   Renato Shin, MD  spironolactone (ALDACTONE) 25 MG tablet Take 1 tablet (25 mg total) by mouth daily. 06/24/21   Sueanne Margarita, MD  temazepam (RESTORIL) 15 MG capsule TAKE ONE CAPSULE BY MOUTH EVERYDAY AT BEDTIME 06/23/21   Mozingo, Berdie Ogren, NP  Tiotropium Bromide-Olodaterol (STIOLTO RESPIMAT) 2.5-2.5 MCG/ACT AERS Inhale 2 puffs into the lungs daily. 07/19/21   Hunsucker, Bonna Gains, MD  torsemide (DEMADEX) 20 MG tablet Take 3 tablets (60 mg total) by mouth every morning AND 2 tablets (40 mg total) every evening. 07/13/21   Larey Dresser, MD  traZODone (DESYREL) 100 MG tablet Take 2 tablets (200 mg total) by mouth at bedtime. 12/31/20   Mozingo, Berdie Ogren, NP  venlafaxine XR (EFFEXOR-XR) 75 MG 24 hr capsule Take 3 capsules (225 mg total) by mouth daily with breakfast. 12/10/20   Mozingo, Berdie Ogren, NP  vitamin B-12 1000 MCG tablet Take 1 tablet (1,000 mcg total) by mouth daily. 04/28/21   Mercy Riding, MD    Physical Exam: Vitals:   08/07/21 2000 08/07/21 2015 08/07/21 2030 08/07/21 2210  BP: 127/69 118/77 123/71 131/66  Pulse: (!) 103 (!) 103 (!) 102 (!) 102  Resp: (!) 21 (!) 23 (!) 21 19  Temp:      TempSrc:      SpO2: (!) 89% 91% 94% 94%   Physical Exam Constitutional:      General: He is not in  acute distress.    Appearance: Normal appearance. He is obese.  HENT:     Head:  Normocephalic and atraumatic.     Mouth/Throat:     Mouth: Mucous membranes are moist.     Pharynx: Oropharynx is clear.  Eyes:     Extraocular Movements: Extraocular movements intact.     Pupils: Pupils are equal, round, and reactive to light.  Cardiovascular:     Rate and Rhythm: Normal rate and regular rhythm.     Pulses: Normal pulses.     Heart sounds: Normal heart sounds.  Pulmonary:     Effort: Pulmonary effort is normal. No respiratory distress.     Breath sounds: Rhonchi and rales (trace) present.  Abdominal:     General: Bowel sounds are normal. There is no distension.     Palpations: Abdomen is soft.     Tenderness: There is no abdominal tenderness.     Comments: Obese abdomen  Musculoskeletal:        General: No swelling or deformity.  Skin:    General: Skin is warm and dry.     Comments: Chronic venous stasis changes bilateral lower extremities  Neurological:     General: No focal deficit present.     Motor: Weakness (mild, generalized) present.     Comments: Alert with intermitted confusion   Labs on Admission: I have personally reviewed following labs and imaging studies  CBC: Recent Labs  Lab 08/07/21 1847 08/07/21 2003  WBC 25.6*  --   NEUTROABS 23.8*  --   HGB 14.9 16.0   15.6  HCT 46.4 47.0   46.0  MCV 91.9  --   PLT 263  --     Basic Metabolic Panel: Recent Labs  Lab 08/07/21 1847 08/07/21 2003  NA 135 135   136  K 4.4 4.2   4.3  CL 97* 100  CO2 27  --   GLUCOSE 175* 167*  BUN 21 24*  CREATININE 1.16 1.00  CALCIUM 9.5  --     GFR: CrCl cannot be calculated (Unknown ideal weight.).  Liver Function Tests: Recent Labs  Lab 08/07/21 1847  AST 23  ALT 26  ALKPHOS 85  BILITOT 1.0  PROT 7.8  ALBUMIN 3.8    Urine analysis:    Component Value Date/Time   COLORURINE YELLOW 08/07/2021 2100   APPEARANCEUR CLEAR 08/07/2021 2100   LABSPEC 1.020 08/07/2021 2100   PHURINE 6.0 08/07/2021 2100   GLUCOSEU >=500 (A) 08/07/2021 2100    HGBUR SMALL (A) 08/07/2021 2100   [place] NEGATIVE 08/07/2021 2100   [place] NEGATIVE 08/07/2021 2100   PROTEINUR NEGATIVE 08/07/2021 2100   NITRITE NEGATIVE 08/07/2021 2100   LEUKOCYTESUR NEGATIVE 08/07/2021 2100    Radiological Exams on Admission: CT Head Wo Contrast  Result Date: 08/07/2021 CLINICAL DATA:  Mental status changes of unknown cause. EXAM: CT HEAD WITHOUT CONTRAST TECHNIQUE: Contiguous axial images were obtained from the base of the skull through the vertex without intravenous contrast. COMPARISON:  Head CT 04/27/2021. FINDINGS: Brain: No evidence of acute infarction, hemorrhage, hydrocephalus, extra-axial collection or mass lesion/mass effect. There is early cerebral atrophy and small vessel disease. Vascular: There are mild calcifications in the carotid siphons without hyperdense central vessels. Skull: Normal. Negative for fracture or focal lesion. Sinuses/Orbits: No acute finding. There is mild chronic membrane thickening in the maxillary and ethmoid sinuses without fluid level. Other sinuses and the bilateral mastoid air cells are clear. Other: None. IMPRESSION: Chronic changes. No acute intracranial  CT findings or interval changes. Electronically Signed   By: Telford Nab M.D.   On: 08/07/2021 21:15   DG Chest Port 1 View  Result Date: 08/07/2021 CLINICAL DATA:  Questionable sepsis. EXAM: PORTABLE CHEST 1 VIEW COMPARISON:  Chest x-ray 02/08/2021. FINDINGS: There are patchy airspace opacities in the bilateral lung bases. There is no pleural effusion or pneumothorax identified. The cardiomediastinal silhouette is stable, the heart is enlarged. No acute fractures are seen. IMPRESSION: 1. Bilateral lower lung airspace disease worrisome for pneumonia or edema. 2. Stable cardiomegaly. Electronically Signed   By: Ronney Asters M.D.   On: 08/07/2021 19:33    EKG: Independently reviewed.  Sinus tachycardia at 109 bpm.  Intraventricular conduction delay.  Low voltage.  Similar  to previous.  Assessment/Plan Principal Problem:   Severe sepsis (HCC) Active Problems:   OSA (obstructive sleep apnea)   PTSD (post-traumatic stress disorder)   BPH without obstruction/lower urinary tract symptoms   Acute respiratory failure with hypoxia (HCC)   Diabetes mellitus type 2 in obese (HCC)   Pure hypercholesterolemia   Chronic diastolic CHF (congestive heart failure) (HCC)   Do not resuscitate status   Diabetic neuropathy ([place])   Type 2 diabetes mellitus with diabetic polyneuropathy, with long-term current use of insulin (HCC)   Essential hypertension   AF (paroxysmal atrial fibrillation) (HCC)   COPD (chronic obstructive pulmonary disease) (HCC)   Depression   Pneumonia  Severe sepsis Pneumonia Acute respiratory failure with hypoxia > Patient presenting with fever and confusion.  Found to have leukocytosis to 25.6, fever to 102.4, heart rate in the 100s, respiratory rate in the 20s all meeting criteria for sepsis.  Meets arteria for severe sepsis with respiratory failure requiring 5 L to maintain saturations and desaturating on room air. > Urinalysis without any evidence of infection, chest x-ray with bilateral lower lobe airspace disease concerning for pneumonia. > Pneumonia as a presumed source given chest x-ray finding and new respiratory failure.  Started on cefepime and azithromycin in the ED. > Blood pressure currently stable.  Did receive 3 L bolus and does have a history of heart failure so we will monitor for volume overload closely. - Monitor on progressive unit - Reduce IV fluid rate considering history of heart failure - Continue with cefepime and azithromycin for now - Follow-up urine culture and blood culture - Procalcitonin - Trend fever curve and white count - Oxygen therapy, wean as tolerated  COPD > Respiratory failure secondary to sepsis versus concurrent COPD exacerbation. > Respiratory failure on 5 L as above - Replace home Stiolto with  formulary Brovana and Incruse - Scheduled duo nebs - As needed albuterol  CHF Hypertension Atrial fibrillation > Diastolic heart failure.  Last echo in November with EF 60-65% and normal RV function. > Has received 3 L of IV fluids in the ED, will continue at a slow rate.  Monitor for volume overload. > Some medications are not in agreement between his records from [place].  We will continue medications that are on both lists and await pharmacy verification of other medications. - Continue with metoprolol, pravastatin - Continue with diuretic Lasix 80 mg twice daily - Hold lisinopril, spironolactone, Imdur, diltiazem until these can be confirmed - Continue with Eliquis for now, working on confirmation, patient does report he is taken a blood thinner  Diabetes - SSI  BPH - Continue home pravastatin  OSA - Continue CPAP   PTSD Depression > There are also medication incongruency  is here, will wait for pharmacy to verify. - We will continue with Abilify, trazodone, temazepam - Hold off on sertraline versus other antidepressants for now  GERD - Continue PPI  Neuropathy - Continue Lyrica  DVT prophylaxis: Likely is on Eliquis given history of A. fib, and patient report taking blood thinner, however there is some disagreement between the [place] and [place] physician med list.  We will presume on Eliquis for now. Code Status:   DNR  Family Communication:  Significant other, [name], Updated by phone. Disposition Plan:   Patient is from:  Home  Anticipated DC to:  Home  Anticipated DC date:  1 to 5 days  Anticipated DC barriers: None  Consults called:  None  Admission status:  Observation, progressive  Severity of Illness: The appropriate patient status for this patient is OBSERVATION. Observation status is judged to be reasonable and necessary in order to provide the required intensity of service to ensure the patient's safety. The patient's presenting symptoms,  physical exam findings, and initial radiographic and laboratory data in the context of their medical condition is felt to place them at decreased risk for further clinical deterioration. Furthermore, it is anticipated that the patient will be medically stable for discharge from the hospital within 2 midnights of admission.    Marcelyn Bruins MD [facility]  How to contact the [facility] Attending or Consulting provider [place] or covering provider during after hours [place], for this patient?   Check the care team in [facility] and look for a) attending/consulting [facility] provider listed and b) the [facility] team listed Log into [email or web address] and use [place]'s universal password to access. If you do not have the password, please contact the hospital operator. Locate the [facility] provider you are looking for under [facility] and page to a number that you can be directly reached. If you still have difficulty reaching the provider, please page the [facility] (Director on Call) for the Hospitalists listed on amion for assistance.  08/07/2021, 11:05 PM

## 2021-08-08 ENCOUNTER — Other Ambulatory Visit: Payer: Self-pay

## 2021-08-08 ENCOUNTER — Inpatient Hospital Stay (HOSPITAL_COMMUNITY): Payer: No Typology Code available for payment source

## 2021-08-08 DIAGNOSIS — N400 Enlarged prostate without lower urinary tract symptoms: Secondary | ICD-10-CM | POA: Diagnosis not present

## 2021-08-08 DIAGNOSIS — J440 Chronic obstructive pulmonary disease with acute lower respiratory infection: Secondary | ICD-10-CM | POA: Diagnosis present

## 2021-08-08 DIAGNOSIS — I5032 Chronic diastolic (congestive) heart failure: Secondary | ICD-10-CM | POA: Diagnosis present

## 2021-08-08 DIAGNOSIS — R569 Unspecified convulsions: Secondary | ICD-10-CM | POA: Diagnosis not present

## 2021-08-08 DIAGNOSIS — J441 Chronic obstructive pulmonary disease with (acute) exacerbation: Secondary | ICD-10-CM | POA: Diagnosis present

## 2021-08-08 DIAGNOSIS — I110 Hypertensive heart disease with heart failure: Secondary | ICD-10-CM | POA: Diagnosis present

## 2021-08-08 DIAGNOSIS — Z6841 Body Mass Index (BMI) 40.0 and over, adult: Secondary | ICD-10-CM | POA: Diagnosis not present

## 2021-08-08 DIAGNOSIS — I517 Cardiomegaly: Secondary | ICD-10-CM | POA: Diagnosis not present

## 2021-08-08 DIAGNOSIS — R0789 Other chest pain: Secondary | ICD-10-CM | POA: Diagnosis not present

## 2021-08-08 DIAGNOSIS — Z66 Do not resuscitate: Secondary | ICD-10-CM | POA: Diagnosis present

## 2021-08-08 DIAGNOSIS — G928 Other toxic encephalopathy: Secondary | ICD-10-CM | POA: Diagnosis present

## 2021-08-08 DIAGNOSIS — D649 Anemia, unspecified: Secondary | ICD-10-CM | POA: Diagnosis present

## 2021-08-08 DIAGNOSIS — J9811 Atelectasis: Secondary | ICD-10-CM | POA: Diagnosis not present

## 2021-08-08 DIAGNOSIS — E1151 Type 2 diabetes mellitus with diabetic peripheral angiopathy without gangrene: Secondary | ICD-10-CM | POA: Diagnosis present

## 2021-08-08 DIAGNOSIS — R079 Chest pain, unspecified: Secondary | ICD-10-CM | POA: Diagnosis not present

## 2021-08-08 DIAGNOSIS — F32A Depression, unspecified: Secondary | ICD-10-CM | POA: Diagnosis present

## 2021-08-08 DIAGNOSIS — I480 Paroxysmal atrial fibrillation: Secondary | ICD-10-CM | POA: Diagnosis not present

## 2021-08-08 DIAGNOSIS — I471 Supraventricular tachycardia: Secondary | ICD-10-CM | POA: Diagnosis present

## 2021-08-08 DIAGNOSIS — F4310 Post-traumatic stress disorder, unspecified: Secondary | ICD-10-CM | POA: Diagnosis present

## 2021-08-08 DIAGNOSIS — R0602 Shortness of breath: Secondary | ICD-10-CM | POA: Diagnosis not present

## 2021-08-08 DIAGNOSIS — R404 Transient alteration of awareness: Secondary | ICD-10-CM | POA: Diagnosis not present

## 2021-08-08 DIAGNOSIS — E876 Hypokalemia: Secondary | ICD-10-CM | POA: Diagnosis present

## 2021-08-08 DIAGNOSIS — Z20822 Contact with and (suspected) exposure to covid-19: Secondary | ICD-10-CM | POA: Diagnosis present

## 2021-08-08 DIAGNOSIS — R6520 Severe sepsis without septic shock: Secondary | ICD-10-CM | POA: Diagnosis present

## 2021-08-08 DIAGNOSIS — G40909 Epilepsy, unspecified, not intractable, without status epilepticus: Secondary | ICD-10-CM | POA: Diagnosis present

## 2021-08-08 DIAGNOSIS — R531 Weakness: Secondary | ICD-10-CM | POA: Diagnosis not present

## 2021-08-08 DIAGNOSIS — A419 Sepsis, unspecified organism: Secondary | ICD-10-CM | POA: Diagnosis present

## 2021-08-08 DIAGNOSIS — J9601 Acute respiratory failure with hypoxia: Secondary | ICD-10-CM | POA: Diagnosis present

## 2021-08-08 DIAGNOSIS — Z743 Need for continuous supervision: Secondary | ICD-10-CM | POA: Diagnosis not present

## 2021-08-08 DIAGNOSIS — J189 Pneumonia, unspecified organism: Secondary | ICD-10-CM | POA: Diagnosis present

## 2021-08-08 DIAGNOSIS — E7800 Pure hypercholesterolemia, unspecified: Secondary | ICD-10-CM | POA: Diagnosis present

## 2021-08-08 DIAGNOSIS — I350 Nonrheumatic aortic (valve) stenosis: Secondary | ICD-10-CM | POA: Diagnosis present

## 2021-08-08 DIAGNOSIS — E1142 Type 2 diabetes mellitus with diabetic polyneuropathy: Secondary | ICD-10-CM | POA: Diagnosis present

## 2021-08-08 DIAGNOSIS — R251 Tremor, unspecified: Secondary | ICD-10-CM | POA: Diagnosis not present

## 2021-08-08 DIAGNOSIS — G4700 Insomnia, unspecified: Secondary | ICD-10-CM | POA: Diagnosis present

## 2021-08-08 LAB — COMPREHENSIVE METABOLIC PANEL
ALT: 25 U/L (ref 0–44)
AST: 19 U/L (ref 15–41)
Albumin: 3.4 g/dL — ABNORMAL LOW (ref 3.5–5.0)
Alkaline Phosphatase: 74 U/L (ref 38–126)
Anion gap: 10 (ref 5–15)
BUN: 19 mg/dL (ref 8–23)
CO2: 27 mmol/L (ref 22–32)
Calcium: 8.9 mg/dL (ref 8.9–10.3)
Chloride: 99 mmol/L (ref 98–111)
Creatinine, Ser: 0.97 mg/dL (ref 0.61–1.24)
GFR, Estimated: >60 mL/min (ref >60)
Glucose, Bld: 144 mg/dL — ABNORMAL HIGH (ref 70–99)
Potassium: 3.9 mmol/L (ref 3.5–5.1)
Sodium: 136 mmol/L (ref 135–145)
Total Bilirubin: 0.9 mg/dL (ref 0.3–1.2)
Total Protein: 7 g/dL (ref 6.5–8.1)

## 2021-08-08 LAB — CBC
HCT: 43.4 % (ref 39.0–52.0)
Hemoglobin: 14.1 g/dL (ref 13.0–17.0)
MCH: 30 pg (ref 26.0–34.0)
MCHC: 32.5 g/dL (ref 30.0–36.0)
MCV: 92.3 fL (ref 80.0–100.0)
Platelets: 239 10*3/uL (ref 150–400)
RBC: 4.7 MIL/uL (ref 4.22–5.81)
RDW: 15.6 % — ABNORMAL HIGH (ref 11.5–15.5)
WBC: 23.5 10*3/uL — ABNORMAL HIGH (ref 4.0–10.5)
nRBC: 0 % (ref 0.0–0.2)

## 2021-08-08 LAB — LACTIC ACID, PLASMA: Lactic Acid, Venous: 1.4 mmol/L (ref 0.5–1.9)

## 2021-08-08 LAB — CBG MONITORING, ED
Glucose-Capillary: 142 mg/dL — ABNORMAL HIGH (ref 70–99)
Glucose-Capillary: 184 mg/dL — ABNORMAL HIGH (ref 70–99)
Glucose-Capillary: 179 mg/dL — ABNORMAL HIGH (ref 70–99)

## 2021-08-08 LAB — PROTIME-INR
INR: 1.1 (ref 0.8–1.2)
Prothrombin Time: 13.7 seconds (ref 11.4–15.2)

## 2021-08-08 LAB — GLUCOSE, CAPILLARY: Glucose-Capillary: 175 mg/dL — ABNORMAL HIGH (ref 70–99)

## 2021-08-08 LAB — PROCALCITONIN: Procalcitonin: 0.41 ng/mL

## 2021-08-08 MED ORDER — NALOXONE HCL 0.4 MG/ML IJ SOLN: 0.4000 mg | INTRAMUSCULAR | Status: DC | PRN

## 2021-08-08 MED ORDER — BUSPIRONE HCL 5 MG PO TABS
5.0000 mg | ORAL_TABLET | Freq: Three times a day (TID) | ORAL | Status: DC
  Administered 2021-08-08 – 2021-08-12 (×12): 5 mg via ORAL
  Filled 2021-08-08 (×12): qty 1

## 2021-08-08 MED ORDER — TEMAZEPAM 15 MG PO CAPS
15.0000 mg | ORAL_CAPSULE | Freq: Every day | ORAL | Status: DC
  Administered 2021-08-08 – 2021-08-10 (×3): 15 mg via ORAL
  Filled 2021-08-08 (×3): qty 1

## 2021-08-08 MED ORDER — TORSEMIDE 20 MG PO TABS
40.0000 mg | ORAL_TABLET | Freq: Two times a day (BID) | ORAL | Status: DC
  Administered 2021-08-08 – 2021-08-09 (×2): 40 mg via ORAL
  Filled 2021-08-08 (×2): qty 2

## 2021-08-08 MED ORDER — OXYCODONE HCL 5 MG PO TABS
10.0000 mg | ORAL_TABLET | Freq: Three times a day (TID) | ORAL | Status: DC | PRN
  Administered 2021-08-08 – 2021-08-11 (×7): 10 mg via ORAL
  Filled 2021-08-08 (×9): qty 2

## 2021-08-08 MED ORDER — KETOROLAC TROMETHAMINE 15 MG/ML IJ SOLN
15.0000 mg | Freq: Once | INTRAMUSCULAR | Status: AC
  Administered 2021-08-09: 15 mg via INTRAVENOUS
  Filled 2021-08-08: qty 1

## 2021-08-08 MED ORDER — VENLAFAXINE HCL ER 75 MG PO CP24
225.0000 mg | ORAL_CAPSULE | Freq: Every day | ORAL | Status: DC
  Administered 2021-08-09 – 2021-08-12 (×4): 225 mg via ORAL
  Filled 2021-08-08: qty 3
  Filled 2021-08-08: qty 1
  Filled 2021-08-08 (×3): qty 3

## 2021-08-08 MED ORDER — NITROGLYCERIN 0.4 MG SL SUBL
SUBLINGUAL_TABLET | SUBLINGUAL | Status: AC
  Administered 2021-08-08: 0.4 mg
  Filled 2021-08-08: qty 3

## 2021-08-08 NOTE — Significant Event (Signed)
Rapid Response Event Note   Reason for Call :  CP Initial Focused Assessment:  I was notified by Gerald Stabs RN regarding [name] c/o dull left sided chest pain. SL NTG was given times 2 and briefly eliminated his CP. EKG shows SR with PVCs and no acute STE. Pt was placed back on his CPAP at his previous settings. Orders received for PCXR, Oxycodone and Trop level.   2345- HR 90 SR with PVCs, 114/74 (87), RR 21 with sats 97% on 6L via CPAP   Interventions:  -Stat EKG -NTG SL 0.4mg  x2 -placed back on CPAP at previous settings   MD Notified: Dr. Velia Meyer per primary RN Call Time: 2315 Arrival Time: 2320 End Time: 2355  Madelynn Done, RN

## 2021-08-08 NOTE — ED Notes (Signed)
Pt asking for oycodone for leg pain he truned down 650mg  tylenol

## 2021-08-08 NOTE — Progress Notes (Signed)
[facility] night cross cover note:  I was notified by RN of patient's request for resumption of home as needed oxycodone for his chronic leg pain. Per my chart review, it appears that the patient remains normotensive, and with renal function at baseline.  I subsequently placed order to resume home prn oxycodone, with plan to continue to closely monitor ensuing respiratory status, including for any evidence of respiratory suppression as a consequence of this. Prn narcan also ordered.   Babs Bertin, DO Hospitalist

## 2021-08-08 NOTE — Progress Notes (Signed)
PROGRESS NOTE    Lawrence [name]  QAS:[id number] DOB: [birth year] DOA: 08/07/2021 PCP: [name], PA-C   Brief Narrative:  The patient is a 66 year old morbidly obese Caucasian male with a past medical history significant for but limited to chronic diastolic CHF, atrial fibrillation, history of SVT, history of anemia, BPH, history of venous stasis, COPD, diabetes mellitus type 2, hypertension, OSA, partial seizures, PTSD as well as depression and other comorbidities who presented with ongoing altered mental status and fevers.  Due to his altered mental status history is obtained from the chart and family.  Patient's family called EMS yesterday due to his ongoing confusion and worsening weakness.  At baseline patient is alert and oriented and usually able to walk with a walker.  He was unable to do so yesterday and had an episode of incontinence because unable to get the strength to get out of the chair.  He was brought to the ED and was found to be febrile with a fever 102.4, heart rate in the 100s and respiratory rate in the 20s requiring 5 L to maintain saturation.  He had basic blood work done and lactic acid was initially elevated mildly at 2.2 and trended down to 1.4.  His BNP is normal and his respiratory influenza panel and COVID were negative.  Urinalysis was done and showed only small hemoglobin.  Urine culture and blood culture sent.  Chest x-ray showed bilateral lower airspace disease concerning for pneumonia versus edema.  He had stable cardiomegaly and he is initiated on cefepime and azithromycin in the ED and received a total 3 L of bolus fluids and is started on maintenance IV fluids.  He is also received acetaminophen.  He was admitted for severe sepsis in the setting of pneumonia with complications of acute respiratory failure with hypoxia.   Assessment & Plan:   Principal Problem:   Severe sepsis ([place]) Active Problems:   OSA (obstructive sleep apnea)   PTSD (post-traumatic  stress disorder)   BPH without obstruction/lower urinary tract symptoms   Acute respiratory failure with hypoxia (HCC)   Diabetes mellitus type 2 in obese (HCC)   Pure hypercholesterolemia   Chronic diastolic CHF (congestive heart failure) (HCC)   Do not resuscitate status   Diabetic neuropathy ([place])   Type 2 diabetes mellitus with diabetic polyneuropathy, with long-term current use of insulin (HCC)   Essential hypertension   AF (paroxysmal atrial fibrillation) (HCC)   COPD (chronic obstructive pulmonary disease) (HCC)   Depression   Pneumonia    Severe sepsis Pneumonia Acute respiratory failure with hypoxia -Patient presenting with fever and confusion.  Found to have leukocytosis to 25.6, fever to 102.4, heart rate in the 100s, respiratory rate in the 20s all meeting criteria for sepsis.  Meets criteria for severe sepsis with respiratory failure requiring 5 L to maintain saturations and desaturating on room air. -Urinalysis without any evidence of infection, chest x-ray with bilateral lower lobe airspace disease concerning for pneumonia. > Pneumonia as a presumed source given chest x-ray finding and new respiratory failure.  Started on cefepime and azithromycin in the ED and will continue -Blood pressure currently stable.  Did receive 3 L bolus and does have a history of heart failure so we will monitor for volume overload closely. -Continue to monitor Monitor on progressive unit -Reduced IV fluid rate considering history of heart failure and now stopped -Continue with cefepime and azithromycin for now -Follow-up urine culture and blood culture -Procalcitonin was mildly elevated at 0.41 -Continue  toTrend fever curve and white count -SpO2: 93 % O2 Flow Rate (L/min): 5 L/min -WBC 25.6 -> 23.5  -Started on DuoNeb 3 mils every 6 scheduled and will add flutter valve, incentive spirometry -Continuous pulse oximetry maintain O2 saturation greater than 90% -Continue supplemental oxygen via  nasal cannula and wean O2 as tolerated -We will need an ambulatory home O2 screen and repeat chest x-ray prior to discharge   COPD -Respiratory failure secondary to sepsis versus concurrent COPD exacerbation. -Respiratory failure on 5 L as above - Replace home Stiolto with formulary Brovana and Incruse -Continue with scheduled duo nebs - As needed albuterol -Antibiotic coverage as above -Currently not wheezing so we will use steroids   Chronic diastolic CHF Hypertension Atrial fibrillation > Diastolic heart failure.  Last echo in November with EF 60-65% and normal RV function. > Has received 3 L of IV fluids in the ED and will hold on further IV fluid hydration -Resume his home Lasix 80 mg p.o. twice daily but it appears he is no longer taking furosemide so we will continue his home torsemide 60 mg every morning and 40 mg every evening -Some medications are not in agreement between his records from [facility].  We will continue medications that are on both lists and await pharmacy verification of other medications. - Continue with metoprolol, pravastatin - Continue with diuretic Lasix 80 mg twice daily - Hold lisinopril, but will resume spironolactone, -resume Imdur, diltiazem in the a.m. - Continue with apixaban 5 mg p.o. twice daily hyperlipidemia   Diabetes mellitus type II - SSI -Hold his metformin 2000 mg p.o. daily, Farxiga 10 mg p.o. nightly as well as Trulicity 4.5 mg every Monday -CBGs ranging 142-184  HLD - Continue home atorvastatin  OSA - Continue CPAP   PTSD Depression > Continue with aripiprazole 5 mg p.o. daily, buspirone 5 mg p.o. 3 times daily and prazosin 2 mg p.o. nightly - C/w Trazodone and Temazepam 15 mg po qHS - Held off off on sertraline versus other antidepressants for now  GERD/GI prophylaxis - Continue PPI  Neuropathy - Continue Lyrica  Morbid Obesity -Complicates overall prognosis and care -Estimated body mass index is  49.05 kg/m as calculated from the following:   Height as of 07/20/21: 6\' 2"  (1.88 m).   Weight as of this encounter: 173.3 kg.  -Weight Loss and Dietary Counseling given  DVT prophylaxis: Anticoagulated with Apixaban Code Status: FULL CODE Family Communication: No family present at bedside  Disposition Plan: Ending further clinical improvement and weaning of oxygen.  He will need an amatory home O2 screen as well as evaluation by PT OT and improvement in his pneumonia  Status is: Observation  The patient will require care spanning > 2 midnights and should be moved to inpatient because: He will need PT OT to further evaluate and be weaned off of supplemental oxygen    Consultants:  None  Procedures: None  Antimicrobials:  Anti-infectives (From admission, onward)    Start     Dose/Rate Route Frequency Ordered Stop   08/08/21 0400  ceFEPIme (MAXIPIME) 2 g in sodium chloride 0.9 % 100 mL IVPB        2 g 200 mL/hr over 30 Minutes Intravenous Every 8 hours 08/07/21 2109     08/07/21 2115  azithromycin (ZITHROMAX) 500 mg in sodium chloride 0.9 % 250 mL IVPB        500 mg 250 mL/hr over 60 Minutes Intravenous Every 24 hours 08/07/21  2100 08/12/21 2159   08/07/21 1900  ceFEPIme (MAXIPIME) 2 g in sodium chloride 0.9 % 100 mL IVPB        2 g 200 mL/hr over 30 Minutes Intravenous  Once 08/07/21 1853 08/07/21 1949        Subjective: Seen and examined at bedside and still little confused.  Complained of some leg pain.  No nausea or vomiting.  Still feels a little short of breath and states he does feel better.  No lightheadedness or dizziness but has not been tried on weaning his oxygen.  No other concerns or complaints at this time.  Objective: Vitals:   08/08/21 1454 08/08/21 1500 08/08/21 1606 08/08/21 1700  BP:  123/74 112/67 113/72  Pulse:  88 91 88  Resp:  20 15 16   Temp: 97.7 F (36.5 C)     TempSrc: Oral     SpO2:  93% 94% 93%  Weight:        Intake/Output Summary  (Last 24 hours) at 08/08/2021 1713 Last data filed at 08/08/2021 1606 Gross per 24 hour  Intake --  Output 2275 ml  Net -2275 ml   Filed Weights   08/08/21 0800  Weight: (!) 173.3 kg   Examination: Physical Exam:  Constitutional: Well-nourished, well-developed morbidly obese Caucasian male currently no acute distress appears calm Eyes: Lids and conjunctivae normal, sclerae anicteric  ENMT: External Ears, Nose appear normal. Grossly normal hearing. Mucous membranes are moist.   Neck: Appears normal, supple, no cervical masses, normal ROM, no appreciable thyromegaly; Difficult to assess JVD given his body habitus Respiratory: Diminished to auscultation bilaterally with coarse breath sounds and some rhonchi, no wheezing, rales, or crackles. Normal respiratory effort and patient is not tachypenic. No accessory muscle use.  Wearing supplemental oxygen via nasal cannula Cardiovascular: RRR, no murmurs / rubs / gallops. S1 and S2 auscultated.  1+ lower extremity edema with venous stasis changes Abdomen: Soft, non-tender, distended secondary body habitus. Bowel sounds positive.  GU: Deferred. Musculoskeletal: No clubbing / cyanosis of digits/nails. No joint deformity upper and lower extremities.  Skin: No rashes, lesions, ulcers but does have some lower extremity redness in his legs consistent with venous stasis changes and some hemosiderin deposition. No induration; Warm and dry.  Neurologic: CN 2-12 grossly intact with no focal deficits. Romberg sign cerebellar reflexes not assessed.  Psychiatric: Slightly impaired judgment and insight.  He is awake and alert. Normal mood and appropriate affect.   Data Reviewed: I have personally reviewed following labs and imaging studies  CBC: Recent Labs  Lab 08/07/21 1847 08/07/21 2003 08/08/21 0201  WBC 25.6*  --  23.5*  NEUTROABS 23.8*  --   --   HGB 14.9 16.0   15.6 14.1  HCT 46.4 47.0   46.0 43.4  MCV 91.9  --  92.3  PLT 263  --  195   Basic  Metabolic Panel: Recent Labs  Lab 08/07/21 1847 08/07/21 2003 08/08/21 0201  NA 135 135   136 136  K 4.4 4.2   4.3 3.9  CL 97* 100 99  CO2 27  --  27  GLUCOSE 175* 167* 144*  BUN 21 24* 19  CREATININE 1.16 1.00 0.97  CALCIUM 9.5  --  8.9   GFR: Estimated Creatinine Clearance: 129.1 mL/min (by C-G formula based on SCr of 0.97 mg/dL). Liver Function Tests: Recent Labs  Lab 08/07/21 1847 08/08/21 0201  AST 23 19  ALT 26 25  ALKPHOS 85 74  BILITOT 1.0  0.9  PROT 7.8 7.0  ALBUMIN 3.8 3.4*   No results for input(s): LIPASE, AMYLASE in the last 168 hours. No results for input(s): AMMONIA in the last 168 hours. Coagulation Profile: Recent Labs  Lab 08/07/21 1847 08/08/21 0201  INR 1.0 1.1   Cardiac Enzymes: No results for input(s): CKTOTAL, CKMB, CKMBINDEX, TROPONINI in the last 168 hours. BNP (last 3 results) No results for input(s): PROBNP in the last 8760 hours. HbA1C: No results for input(s): HGBA1C in the last 72 hours. CBG: Recent Labs  Lab 08/08/21 0805 08/08/21 1113  GLUCAP 142* 184*   Lipid Profile: No results for input(s): CHOL, HDL, LDLCALC, TRIG, CHOLHDL, LDLDIRECT in the last 72 hours. Thyroid Function Tests: No results for input(s): TSH, T4TOTAL, FREET4, T3FREE, THYROIDAB in the last 72 hours. Anemia Panel: No results for input(s): VITAMINB12, FOLATE, FERRITIN, TIBC, IRON, RETICCTPCT in the last 72 hours. Sepsis Labs: Recent Labs  Lab 08/07/21 1847 08/08/21 0119 08/08/21 0201  PROCALCITON  --   --  0.41  LATICACIDVEN 2.2* 1.4  --     Recent Results (from the past 240 hour(s))  Blood Culture (routine x 2)     Status: None (Preliminary result)   Collection Time: 08/07/21  6:47 PM   Specimen: BLOOD  Result Value Ref Range Status   Specimen Description BLOOD LEFT ANTECUBITAL  Final   Special Requests   Final    BOTTLES DRAWN AEROBIC AND ANAEROBIC Blood Culture results may not be optimal due to an inadequate volume of blood received in culture  bottles   Culture   Final    NO GROWTH < 24 HOURS Performed at [facility] [address], [place], [place] [address]    Report Status PENDING  Incomplete  Blood Culture (routine x 2)     Status: None (Preliminary result)   Collection Time: 08/07/21  6:52 PM   Specimen: BLOOD RIGHT HAND  Result Value Ref Range Status   Specimen Description BLOOD RIGHT HAND  Final   Special Requests   Final    BOTTLES DRAWN AEROBIC AND ANAEROBIC Blood Culture adequate volume   Culture   Final    NO GROWTH < 24 HOURS Performed at [facility], [place] [address], [place],  [address]    Report Status PENDING  Incomplete  Resp Panel by RT-PCR (Flu A&B, Covid) Nasopharyngeal Swab     Status: None   Collection Time: 08/07/21  7:50 PM   Specimen: Nasopharyngeal Swab; Nasopharyngeal(NP) swabs in vial transport medium  Result Value Ref Range Status   SARS Coronavirus 2 by RT PCR NEGATIVE NEGATIVE Final    Comment: (NOTE) SARS-CoV-2 target nucleic acids are NOT DETECTED.  The SARS-CoV-2 RNA is generally detectable in upper respiratory specimens during the acute phase of infection. The lowest concentration of SARS-CoV-2 viral copies this assay can detect is 138 copies/mL. A negative result does not preclude SARS-Cov-2 infection and should not be used as the sole basis for treatment or other patient management decisions. A negative result may occur with  improper specimen collection/handling, submission of specimen other than nasopharyngeal swab, presence of viral mutation(s) within the areas targeted by this assay, and inadequate number of viral copies(<138 copies/mL). A negative result must be combined with clinical observations, patient history, and epidemiological information. The expected result is Negative.  Fact Sheet for Patients:  EntrepreneurPulse.com.au  Fact Sheet for Healthcare Providers:  IncredibleEmployment.be  This test is no t yet  approved or cleared by the  [place] Architectural [employer] and  has been authorized for detection and/or diagnosis of SARS-CoV-2 by FDA under an Print production planner (EUA). This EUA will remain  in effect (meaning this test can be used) for the duration of the COVID-19 declaration under Section 564(b)(1) of the Act, 21 U.S.C.section 360bbb-3(b)(1), unless the authorization is terminated  or revoked sooner.       Influenza A by PCR NEGATIVE NEGATIVE Final   Influenza B by PCR NEGATIVE NEGATIVE Final    Comment: (NOTE) The Xpert Xpress SARS-CoV-2/FLU/RSV plus assay is intended as an aid in the diagnosis of influenza from Nasopharyngeal swab specimens and should not be used as a sole basis for treatment. Nasal washings and aspirates are unacceptable for Xpert Xpress SARS-CoV-2/FLU/RSV testing.  Fact Sheet for Patients: EntrepreneurPulse.com.au  Fact Sheet for Healthcare Providers: IncredibleEmployment.be  This test is not yet approved or cleared by the [place] FDA and has been authorized for detection and/or diagnosis of SARS-CoV-2 by FDA under an Emergency Use Authorization (EUA). This EUA will remain in effect (meaning this test can be used) for the duration of the COVID-19 declaration under Section 564(b)(1) of the Act, 21 U.S.C. section 360bbb-3(b)(1), unless the authorization is terminated or revoked.  Performed at [facility], [place] [address] [place], [place] [address]     RN Pressure Injury Documentation:     Estimated body mass index is 49.05 kg/m as calculated from the following:   Height as of 07/20/21: 6\' 2"  (1.88 m).   Weight as of this encounter: 173.3 kg.  Malnutrition Type:   Malnutrition Characteristics:   Nutrition Interventions:    Radiology Studies: CT Head Wo Contrast  Result Date: 08/07/2021 CLINICAL DATA:  Mental status changes of unknown cause. EXAM: CT HEAD WITHOUT CONTRAST TECHNIQUE: Contiguous  axial images were obtained from the base of the skull through the vertex without intravenous contrast. COMPARISON:  Head CT 04/27/2021. FINDINGS: Brain: No evidence of acute infarction, hemorrhage, hydrocephalus, extra-axial collection or mass lesion/mass effect. There is early cerebral atrophy and small vessel disease. Vascular: There are mild calcifications in the carotid siphons without hyperdense central vessels. Skull: Normal. Negative for fracture or focal lesion. Sinuses/Orbits: No acute finding. There is mild chronic membrane thickening in the maxillary and ethmoid sinuses without fluid level. Other sinuses and the bilateral mastoid air cells are clear. Other: None. IMPRESSION: Chronic changes. No acute intracranial CT findings or interval changes. Electronically Signed   By: Telford Nab M.D.   On: 08/07/2021 21:15   DG Chest Port 1 View  Result Date: 08/07/2021 CLINICAL DATA:  Questionable sepsis. EXAM: PORTABLE CHEST 1 VIEW COMPARISON:  Chest x-ray 02/08/2021. FINDINGS: There are patchy airspace opacities in the bilateral lung bases. There is no pleural effusion or pneumothorax identified. The cardiomediastinal silhouette is stable, the heart is enlarged. No acute fractures are seen. IMPRESSION: 1. Bilateral lower lung airspace disease worrisome for pneumonia or edema. 2. Stable cardiomegaly. Electronically Signed   By: Ronney Asters M.D.   On: 08/07/2021 19:33    Scheduled Meds:  apixaban  5 mg Oral BID   arformoterol  15 mcg Nebulization BID   And   umeclidinium bromide  1 puff Inhalation Daily   ARIPiprazole  5 mg Oral Daily   atorvastatin  40 mg Oral QPM   furosemide  80 mg Oral BID   insulin aspart  0-20 Units Subcutaneous TID WC   ipratropium-albuterol  3 mL Nebulization Q6H   metoprolol succinate  50 mg Oral  Daily   pantoprazole  40 mg Oral Daily   prazosin  2 mg Oral QHS   pregabalin  150 mg Oral TID   sodium chloride flush  3 mL Intravenous Q12H   spironolactone  25 mg  Oral Daily   temazepam  15 mg Oral QHS   traZODone  200 mg Oral QHS   Continuous Infusions:  azithromycin Stopped (08/07/21 2228)   ceFEPime (MAXIPIME) IV Stopped (08/08/21 1145)    LOS: 0 days   Kerney Elbe, DO [facility] PAGER is on [place]  If 7PM-7AM, please contact night-coverage [email or web address]

## 2021-08-09 ENCOUNTER — Inpatient Hospital Stay (HOSPITAL_COMMUNITY): Payer: No Typology Code available for payment source

## 2021-08-09 DIAGNOSIS — R251 Tremor, unspecified: Secondary | ICD-10-CM

## 2021-08-09 DIAGNOSIS — R404 Transient alteration of awareness: Secondary | ICD-10-CM | POA: Diagnosis not present

## 2021-08-09 DIAGNOSIS — I480 Paroxysmal atrial fibrillation: Secondary | ICD-10-CM | POA: Diagnosis not present

## 2021-08-09 DIAGNOSIS — R079 Chest pain, unspecified: Secondary | ICD-10-CM

## 2021-08-09 DIAGNOSIS — I5032 Chronic diastolic (congestive) heart failure: Secondary | ICD-10-CM | POA: Diagnosis not present

## 2021-08-09 DIAGNOSIS — N400 Enlarged prostate without lower urinary tract symptoms: Secondary | ICD-10-CM | POA: Diagnosis not present

## 2021-08-09 DIAGNOSIS — J9601 Acute respiratory failure with hypoxia: Secondary | ICD-10-CM | POA: Diagnosis not present

## 2021-08-09 DIAGNOSIS — A419 Sepsis, unspecified organism: Secondary | ICD-10-CM | POA: Diagnosis not present

## 2021-08-09 LAB — COMPREHENSIVE METABOLIC PANEL
ALT: 22 U/L (ref 0–44)
AST: 18 U/L (ref 15–41)
Albumin: 3 g/dL — ABNORMAL LOW (ref 3.5–5.0)
Alkaline Phosphatase: 59 U/L (ref 38–126)
Anion gap: 10 (ref 5–15)
BUN: 17 mg/dL (ref 8–23)
CO2: 28 mmol/L (ref 22–32)
Calcium: 8.4 mg/dL — ABNORMAL LOW (ref 8.9–10.3)
Chloride: 99 mmol/L (ref 98–111)
Creatinine, Ser: 1.16 mg/dL (ref 0.61–1.24)
GFR, Estimated: >60 mL/min (ref >60)
Glucose, Bld: 186 mg/dL — ABNORMAL HIGH (ref 70–99)
Potassium: 3.4 mmol/L — ABNORMAL LOW (ref 3.5–5.1)
Sodium: 137 mmol/L (ref 135–145)
Total Bilirubin: 0.7 mg/dL (ref 0.3–1.2)
Total Protein: 6.2 g/dL — ABNORMAL LOW (ref 6.5–8.1)

## 2021-08-09 LAB — URINE CULTURE: Culture: <10000 — AB

## 2021-08-09 LAB — CBC WITH DIFFERENTIAL/PLATELET
Abs Immature Granulocytes: 0.08 10*3/uL — ABNORMAL HIGH (ref 0.00–0.07)
Basophils Absolute: 0 10*3/uL (ref 0.0–0.1)
Basophils Relative: 0 %
Eosinophils Absolute: 0.2 10*3/uL (ref 0.0–0.5)
Eosinophils Relative: 1 %
HCT: 38.8 % — ABNORMAL LOW (ref 39.0–52.0)
Hemoglobin: 12.7 g/dL — ABNORMAL LOW (ref 13.0–17.0)
Immature Granulocytes: 1 %
Lymphocytes Relative: 12 %
Lymphs Abs: 1.4 10*3/uL (ref 0.7–4.0)
MCH: 30 pg (ref 26.0–34.0)
MCHC: 32.7 g/dL (ref 30.0–36.0)
MCV: 91.5 fL (ref 80.0–100.0)
Monocytes Absolute: 0.9 10*3/uL (ref 0.1–1.0)
Monocytes Relative: 8 %
Neutro Abs: 9.1 10*3/uL — ABNORMAL HIGH (ref 1.7–7.7)
Neutrophils Relative %: 78 %
Platelets: 202 10*3/uL (ref 150–400)
RBC: 4.24 MIL/uL (ref 4.22–5.81)
RDW: 15.8 % — ABNORMAL HIGH (ref 11.5–15.5)
WBC: 11.7 10*3/uL — ABNORMAL HIGH (ref 4.0–10.5)
nRBC: 0 % (ref 0.0–0.2)

## 2021-08-09 LAB — GLUCOSE, CAPILLARY
Glucose-Capillary: 180 mg/dL — ABNORMAL HIGH (ref 70–99)
Glucose-Capillary: 210 mg/dL — ABNORMAL HIGH (ref 70–99)
Glucose-Capillary: 174 mg/dL — ABNORMAL HIGH (ref 70–99)
Glucose-Capillary: 177 mg/dL — ABNORMAL HIGH (ref 70–99)

## 2021-08-09 LAB — PHOSPHORUS: Phosphorus: 3.3 mg/dL (ref 2.5–4.6)

## 2021-08-09 LAB — PROCALCITONIN: Procalcitonin: 0.2 ng/mL

## 2021-08-09 LAB — TROPONIN I (HIGH SENSITIVITY)
Troponin I (High Sensitivity): 9 ng/L (ref <18)
Troponin I (High Sensitivity): 8 ng/L (ref <18)
Troponin I (High Sensitivity): 8 ng/L (ref <18)
Troponin I (High Sensitivity): 11 ng/L (ref <18)

## 2021-08-09 LAB — MAGNESIUM: Magnesium: 2.1 mg/dL (ref 1.7–2.4)

## 2021-08-09 MED ORDER — DAPAGLIFLOZIN PROPANEDIOL 10 MG PO TABS
10.0000 mg | ORAL_TABLET | Freq: Every day | ORAL | Status: DC
  Administered 2021-08-10 – 2021-08-12 (×3): 10 mg via ORAL
  Filled 2021-08-09 (×3): qty 1

## 2021-08-09 MED ORDER — ISOSORBIDE MONONITRATE ER 30 MG PO TB24
30.0000 mg | ORAL_TABLET | Freq: Every day | ORAL | Status: DC
  Administered 2021-08-09 – 2021-08-12 (×4): 30 mg via ORAL
  Filled 2021-08-09 (×4): qty 1

## 2021-08-09 MED ORDER — NITROGLYCERIN 0.4 MG SL SUBL
0.4000 mg | SUBLINGUAL_TABLET | SUBLINGUAL | Status: DC | PRN
  Administered 2021-08-09: 0.4 mg via SUBLINGUAL
  Filled 2021-08-09: qty 1

## 2021-08-09 MED ORDER — LORAZEPAM 2 MG/ML IJ SOLN: 2.0000 mg | INTRAMUSCULAR | Status: DC | PRN

## 2021-08-09 MED ORDER — TORSEMIDE 20 MG PO TABS: 20.0000 mg | ORAL_TABLET | Freq: Every day | ORAL | Status: DC

## 2021-08-09 MED ORDER — POTASSIUM CHLORIDE CRYS ER 20 MEQ PO TBCR
40.0000 meq | EXTENDED_RELEASE_TABLET | Freq: Once | ORAL | Status: AC
  Administered 2021-08-09: 40 meq via ORAL
  Filled 2021-08-09: qty 2

## 2021-08-09 MED ORDER — TORSEMIDE 20 MG PO TABS: 40.0000 mg | ORAL_TABLET | Freq: Every evening | ORAL | Status: DC

## 2021-08-09 MED ORDER — DILTIAZEM HCL ER COATED BEADS 180 MG PO CP24
180.0000 mg | ORAL_CAPSULE | Freq: Every day | ORAL | Status: DC
  Administered 2021-08-09 – 2021-08-12 (×4): 180 mg via ORAL
  Filled 2021-08-09 (×4): qty 1

## 2021-08-09 MED ORDER — FLUTICASONE PROPIONATE 50 MCG/ACT NA SUSP
1.0000 | Freq: Every day | NASAL | Status: DC
  Administered 2021-08-09 – 2021-08-12 (×4): 1 via NASAL
  Filled 2021-08-09: qty 16

## 2021-08-09 MED ORDER — TORSEMIDE 20 MG PO TABS
40.0000 mg | ORAL_TABLET | Freq: Every evening | ORAL | Status: DC
  Administered 2021-08-10 – 2021-08-11 (×2): 40 mg via ORAL
  Filled 2021-08-09 (×2): qty 2

## 2021-08-09 MED ORDER — DIAZEPAM 5 MG PO TABS: 5.0000 mg | ORAL_TABLET | Freq: Once | ORAL | Status: DC | PRN

## 2021-08-09 MED ORDER — TORSEMIDE 20 MG PO TABS
60.0000 mg | ORAL_TABLET | Freq: Every day | ORAL | Status: DC
  Administered 2021-08-09 – 2021-08-12 (×4): 60 mg via ORAL
  Filled 2021-08-09 (×4): qty 3

## 2021-08-09 NOTE — Progress Notes (Signed)
PROGRESS NOTE    Lawrence [name]  RUE:[id number] DOB: 1956/04/16 DOA: 08/07/2021 PCP: [name], PA-C   Brief Narrative:  The patient is a 66 year old morbidly obese Caucasian male with a past medical history significant for but limited to chronic diastolic CHF, atrial fibrillation, history of SVT, history of anemia, BPH, history of venous stasis, COPD, diabetes mellitus type 2, hypertension, OSA, partial seizures, PTSD as well as depression and other comorbidities who presented with ongoing altered mental status and fevers.  Due to his altered mental status history is obtained from the chart and family.  Patient's family called EMS yesterday due to his ongoing confusion and worsening weakness.  At baseline patient is alert and oriented and usually able to walk with a walker.  He was unable to do so yesterday and had an episode of incontinence because unable to get the strength to get out of the chair.  He was brought to the ED and was found to be febrile with a fever 102.4, heart rate in the 100s and respiratory rate in the 20s requiring 5 L to maintain saturation.  He had basic blood work done and lactic acid was initially elevated mildly at 2.2 and trended down to 1.4.  His BNP is normal and his respiratory influenza panel and COVID were negative.  Urinalysis was done and showed only small hemoglobin.  Urine culture and blood culture sent.  Chest x-ray showed bilateral lower airspace disease concerning for pneumonia versus edema.  He had stable cardiomegaly and he is initiated on cefepime and azithromycin in the ED and received a total 3 L of bolus fluids and is started on maintenance IV fluids.  He is also received acetaminophen.  He was admitted for severe sepsis in the setting of pneumonia with complications of acute respiratory failure with hypoxia.  This morning he had some chest discomfort and pressure and also had some right arm shakiness and became confused eyes responsive but then came  to.  Cardiology and neurology were both consulted for further evaluation  Assessment & Plan:   Principal Problem:   Severe sepsis ([name]) Active Problems:   OSA (obstructive sleep apnea)   PTSD (post-traumatic stress disorder)   BPH without obstruction/lower urinary tract symptoms   Acute respiratory failure with hypoxia (HCC)   Diabetes mellitus type 2 in obese (HCC)   Pure hypercholesterolemia   Chronic diastolic CHF (congestive heart failure) ([name])   Do not resuscitate status   Diabetic neuropathy ([name])   Type 2 diabetes mellitus with diabetic polyneuropathy, with long-term current use of insulin (HCC)   Essential hypertension   AF (paroxysmal atrial fibrillation) (HCC)   COPD (chronic obstructive pulmonary disease) (HCC)   Depression   Pneumonia    Severe sepsis in the setting of Pneumonia Acute respiratory failure with hypoxia -Patient presenting with fever and confusion.  Found to have leukocytosis to 25.6, fever to 102.4, heart rate in the 100s, respiratory rate in the 20s all meeting criteria for sepsis.  Meets criteria for severe sepsis with respiratory failure requiring 5 L to maintain saturations and desaturating on room air. -Urinalysis without any evidence of infection, chest x-ray with bilateral lower lobe airspace disease concerning for pneumonia. -Pneumonia as a presumed source given chest x-ray finding and new respiratory failure.  Started on cefepime and azithromycin in the ED and will continue -Blood pressure currently stable.  Did receive 3 L bolus and does have a history of heart failure so we will monitor for volume overload closely. -Continue  to monitor Monitor on progressive unit -Reduced IV fluid rate considering history of heart failure and now stopped -Continue with cefepime and azithromycin for now -Follow-up urine culture and blood culture -Procalcitonin was mildly elevated at 0.41 -Continue toTrend fever curve and white count -SpO2: 93 % O2 Flow Rate  (L/min): 5 L/min -WBC 25.6 -> 23.5 and is now further improved to 11.7 -Since procalcitonin level went from 0.41 and then trended down to 0.20 -Started on DuoNeb 3 mils every 6 scheduled and will add flutter valve, incentive spirometry -Continuous pulse oximetry maintain O2 saturation greater than 90% -Continue supplemental oxygen via nasal cannula and wean O2 as tolerated -We will need an ambulatory home O2 screen and repeat chest x-ray prior to discharge; repeat chest x-ray in the a.m. and wean O2 as tolerated   COPD -Respiratory failure secondary to sepsis versus concurrent COPD exacerbation. -Respiratory failure on 5 L as above - Replace home Stiolto with formulary Brovana and Incruse -Continue with scheduled duo nebs - As needed albuterol; as above -Antibiotic coverage as above -Currently not wheezing so we will use steroids   Chronic diastolic CHF Hypertension Atrial fibrillation > Diastolic heart failure.  Last echo in November with EF 60-65% and normal RV function. > Has received 3 L of IV fluids in the ED and will hold on further IV fluid hydration -Resume his home Lasix 80 mg p.o. twice daily but it appears he is no longer taking furosemide so we will continue his home torsemide 60 mg every morning and 40 mg every evening but since his volume status is difficult to assess on exam cardiology is going to check a limited echocardiogram and continue home diuretics -Some medications are not in agreement between his records from [place].  We will continue medications that are on both lists and await pharmacy verification of other medications. - Continue with metoprolol, pravastatin - Continue with diuretic Lasix 80 mg twice daily - Hold lisinopril, but will resume spironolactone, -resume Imdur, diltiazem in the a.m. - Continue with apixaban 5 mg p.o. twice daily hyperlipidemia -Plan is to follow-up in the outpatient setting with the heart failure clinic for right  heart cath -Cardiology recommending spironolactone 25 g daily as well as Farxiga 10 mg daily  Chest pressure/chest pain -Troponins x4 are flat and not elevated -Given nitroglycerin with improvement in his chest pressure -Became diaphoretic this morning and had some chest pressure and cardiology was consulted and they felt the patient had some mixed symptoms and felt that his chest pain was more pleuritic given that he had exacerbation when he did breathe deeply. -He has had work-up for CAD in the past and he had mild nonobstructive CAD in 2019 and a stress test in April 2022 which was low risk -Cardiology felt that his symptoms were related to seizure-like activity with underlying issue but the goal is to get a limited echocardiogram to rule out pericardial effusion  Right arm jerking and unresponsiveness patient with a history of seizures -Could be postictal and having seizures given that he has a history of seizures -Check EEG and consult neurology for further evaluation -EEG done was within normal limits with no seizures or epileptiform discharges seen throughout the recording   Diabetes mellitus type II - SSI -Hold his metformin 2000 mg p.o. daily, Farxiga 10 mg p.o. nightly as well as Trulicity 4.5 mg every Monday -CBGs ranging 174-210  Hypokalemia -Mild at 3.4 -Replete with p.o. KCl 40 mg twice daily x2 dose -  Mag level was 2.1 -Continue monitor and replete as necessary -Repeat CMP in a.m.  HLD - Continue home atorvastatin  Normocytic anemia Likely dilutional drop as he was hemoconcentrated on admission -Patient's hemoglobin/hematocrit went from 16.0/46.0 on admission is now 12.7/38.8 -Check anemia panel in the a.m. -Continue to monitor for signs and symptoms of bleeding; currently no overt bleeding noted -Repeat CBC in a.m.  OSA - Continue CPAP nightly as needed  PTSD Depression > Continue with aripiprazole 5 mg p.o. daily, buspirone 5 mg p.o. 3 times daily and  prazosin 2 mg p.o. nightly - C/w Trazodone and Temazepam 15 mg po qHS - Held off off on sertraline versus other antidepressants for now  GERD/GI prophylaxis - Continue PPI  Neuropathy - Continue Lyrica  Morbid Obesity -Complicates overall prognosis and care -Estimated body mass index is 59.7 kg/m as calculated from the following:   Height as of this encounter: 6\' 2"  (1.88 m).   Weight as of this encounter: 210.9 kg.  -Weight Loss and Dietary Counseling given  DVT prophylaxis: Anticoagulated with Apixaban Code Status: FULL CODE Family Communication: Discussed with family at bedside Disposition Plan: Ending further clinical improvement and weaning of oxygen.  He will need an amatory home O2 screen as well as evaluation by PT OT and improvement in his pneumonia  Status is: Inpatient   The patient will require care spanning > 2 midnights and should be moved to inpatient because: He will need PT OT to further evaluate and be weaned off of supplemental oxygen    Consultants:  Cardiology Neurology  Procedures:  ECHOCARDIOGRAM  Antimicrobials:  Anti-infectives (From admission, onward)    Start     Dose/Rate Route Frequency Ordered Stop   08/08/21 0400  ceFEPIme (MAXIPIME) 2 g in sodium chloride 0.9 % 100 mL IVPB        2 g 200 mL/hr over 30 Minutes Intravenous Every 8 hours 08/07/21 2109     08/07/21 2115  azithromycin (ZITHROMAX) 500 mg in sodium chloride 0.9 % 250 mL IVPB        500 mg 250 mL/hr over 60 Minutes Intravenous Every 24 hours 08/07/21 2100 08/12/21 2159   08/07/21 1900  ceFEPIme (MAXIPIME) 2 g in sodium chloride 0.9 % 100 mL IVPB        2 g 200 mL/hr over 30 Minutes Intravenous  Once 08/07/21 1853 08/07/21 1949        Subjective: Seen and examined at bedside and was having some chest pressure and discomfort earlier this morning and per nursing was not as responsive as he was when I went to see him.  Chest pressure was described as a 5 out of 10 in severity.   He was also complained of some right arm jerking and tremors which have improved.  Patient was nonresponsive but coming to now.  Concern for partial seizure.  Denies any lightheadedness or dizziness but still felt a little short of breath.  No other concerns or complaints this time.  Objective: Vitals:   08/09/21 1138 08/09/21 1235 08/09/21 1406 08/09/21 1653  BP: 118/84 136/78 118/85 114/73  Pulse: (!) 126 (!) 127 88 84  Resp:  20 16 18   Temp: 98.9 F (37.2 C) 100.3 F (37.9 C) 98.9 F (37.2 C) 98.1 F (36.7 C)  TempSrc: Oral Oral Oral Oral  SpO2: 92% 91% 93% 93%  Weight:      Height:        Intake/Output Summary (Last 24 hours) at 08/09/2021 2014 Last  data filed at 08/09/2021 1728 Gross per 24 hour  Intake 2261.67 ml  Output 4410 ml  Net -2148.33 ml    Filed Weights   08/08/21 0800 08/08/21 1841 08/09/21 0506  Weight: (!) 173.3 kg (!) 173.3 kg (!) 210.9 kg   Examination: Physical Exam:  Constitutional: WN/WD morbidly obese Caucasian male currently complaining some chest pain a little bit more responsive from what the nursing staff says Eyes:  Lids and conjunctivae normal, sclerae anicteric  ENMT: External Ears, Nose appear normal. Grossly normal hearing. Mucous membranes are moist.  Neck: Appears normal, supple, no cervical masses, normal ROM, no appreciable thyromegaly; no JVD Respiratory: Diminished to auscultation bilaterally, no wheezing, rales, rhonchi or crackles. Normal respiratory effort and patient is not tachypenic. No accessory muscle use.  Wearing supplemental oxygen via nasal cannula Cardiovascular: RRR, no murmurs / rubs / gallops. S1 and S2 auscultated.  1+ lower extremity edema Abdomen: Soft, non-tender, distended secondary body. Bowel sounds positive.  GU: Deferred. Musculoskeletal: No clubbing / cyanosis of digits/nails. Normal strength and muscle tone.  Skin: No rashes, lesions, ulcers on limited skin eval. No induration; Warm and dry.  Neurologic: CN 2-12  grossly intact with no focal deficits but does have a slight tremor.  Romberg sign cerebellar reflexes not assessed.  Psychiatric: Normal judgment and insight. Alert and oriented x 3.  Anxious mood and appropriate affect.   Data Reviewed: I have personally reviewed following labs and imaging studies  CBC: Recent Labs  Lab 08/07/21 1847 08/07/21 2003 08/08/21 0201 08/09/21 0006  WBC 25.6*  --  23.5* 11.7*  NEUTROABS 23.8*  --   --  9.1*  HGB 14.9 16.0   15.6 14.1 12.7*  HCT 46.4 47.0   46.0 43.4 38.8*  MCV 91.9  --  92.3 91.5  PLT 263  --  239 094    Basic Metabolic Panel: Recent Labs  Lab 08/07/21 1847 08/07/21 2003 08/08/21 0201 08/09/21 0006  NA 135 135   136 136 137  K 4.4 4.2   4.3 3.9 3.4*  CL 97* 100 99 99  CO2 27  --  27 28  GLUCOSE 175* 167* 144* 186*  BUN 21 24* 19 17  CREATININE 1.16 1.00 0.97 1.16  CALCIUM 9.5  --  8.9 8.4*  MG  --   --   --  2.1  PHOS  --   --   --  3.3    GFR: Estimated Creatinine Clearance: 121.7 mL/min (by C-G formula based on SCr of 1.16 mg/dL). Liver Function Tests: Recent Labs  Lab 08/07/21 1847 08/08/21 0201 08/09/21 0006  AST 23 19 18   ALT 26 25 22   ALKPHOS 85 74 59  BILITOT 1.0 0.9 0.7  PROT 7.8 7.0 6.2*  ALBUMIN 3.8 3.4* 3.0*    No results for input(s): LIPASE, AMYLASE in the last 168 hours. No results for input(s): AMMONIA in the last 168 hours. Coagulation Profile: Recent Labs  Lab 08/07/21 1847 08/08/21 0201  INR 1.0 1.1    Cardiac Enzymes: No results for input(s): CKTOTAL, CKMB, CKMBINDEX, TROPONINI in the last 168 hours. BNP (last 3 results) No results for input(s): PROBNP in the last 8760 hours. HbA1C: No results for input(s): HGBA1C in the last 72 hours. CBG: Recent Labs  Lab 08/08/21 1738 08/08/21 2023 08/09/21 0822 08/09/21 1207 08/09/21 1701  GLUCAP 179* 175* 180* 210* 174*    Lipid Profile: No results for input(s): CHOL, HDL, LDLCALC, TRIG, CHOLHDL, LDLDIRECT in the last 72  hours. Thyroid Function Tests: No results for input(s): TSH, T4TOTAL, FREET4, T3FREE, THYROIDAB in the last 72 hours. Anemia Panel: No results for input(s): VITAMINB12, FOLATE, FERRITIN, TIBC, IRON, RETICCTPCT in the last 72 hours. Sepsis Labs: Recent Labs  Lab 08/07/21 1847 08/08/21 0119 08/08/21 0201 08/09/21 0006  PROCALCITON  --   --  0.41 0.20  LATICACIDVEN 2.2* 1.4  --   --      Recent Results (from the past 240 hour(s))  Blood Culture (routine x 2)     Status: None (Preliminary result)   Collection Time: 08/07/21  6:47 PM   Specimen: BLOOD  Result Value Ref Range Status   Specimen Description BLOOD LEFT ANTECUBITAL  Final   Special Requests   Final    BOTTLES DRAWN AEROBIC AND ANAEROBIC Blood Culture results may not be optimal due to an inadequate volume of blood received in culture bottles   Culture   Final    NO GROWTH 2 DAYS Performed at [facility] [address], [place], [place] [address]    Report Status PENDING  Incomplete  Blood Culture (routine x 2)     Status: None (Preliminary result)   Collection Time: 08/07/21  6:52 PM   Specimen: BLOOD RIGHT HAND  Result Value Ref Range Status   Specimen Description BLOOD RIGHT HAND  Final   Special Requests   Final    BOTTLES DRAWN AEROBIC AND ANAEROBIC Blood Culture adequate volume   Culture   Final    NO GROWTH 2 DAYS Performed at [facility], [place] [address], [place], [place] [address]    Report Status PENDING  Incomplete  Resp Panel by RT-PCR (Flu A&B, Covid) Nasopharyngeal Swab     Status: None   Collection Time: 08/07/21  7:50 PM   Specimen: Nasopharyngeal Swab; Nasopharyngeal(NP) swabs in vial transport medium  Result Value Ref Range Status   SARS Coronavirus 2 by RT PCR NEGATIVE NEGATIVE Final    Comment: (NOTE) SARS-CoV-2 target nucleic acids are NOT DETECTED.  The SARS-CoV-2 RNA is generally detectable in upper respiratory specimens during the acute phase of infection. The  lowest concentration of SARS-CoV-2 viral copies this assay can detect is 138 copies/mL. A negative result does not preclude SARS-Cov-2 infection and should not be used as the sole basis for treatment or other patient management decisions. A negative result may occur with  improper specimen collection/handling, submission of specimen other than nasopharyngeal swab, presence of viral mutation(s) within the areas targeted by this assay, and inadequate number of viral copies(<138 copies/mL). A negative result must be combined with clinical observations, patient history, and epidemiological information. The expected result is Negative.  Fact Sheet for Patients:  EntrepreneurPulse.com.au  Fact Sheet for Healthcare Providers:  IncredibleEmployment.be  This test is no t yet approved or cleared by the [place] FDA and  has been authorized for detection and/or diagnosis of SARS-CoV-2 by FDA under an Emergency Use Authorization (EUA). This EUA will remain  in effect (meaning this test can be used) for the duration of the COVID-19 declaration under Section 564(b)(1) of the Act, 21 U.S.C.section 360bbb-3(b)(1), unless the authorization is terminated  or revoked sooner.       Influenza A by PCR NEGATIVE NEGATIVE Final   Influenza B by PCR NEGATIVE NEGATIVE Final    Comment: (NOTE) The Xpert Xpress SARS-CoV-2/FLU/RSV plus assay is intended as an aid in the diagnosis of influenza from Nasopharyngeal swab specimens and should not be used as a sole  basis for treatment. Nasal washings and aspirates are unacceptable for Xpert Xpress SARS-CoV-2/FLU/RSV testing.  Fact Sheet for Patients: EntrepreneurPulse.com.au  Fact Sheet for Healthcare Providers: IncredibleEmployment.be  This test is not yet approved or cleared by the [place] FDA and has been authorized for detection and/or diagnosis of SARS-CoV-2 by FDA under  an Emergency Use Authorization (EUA). This EUA will remain in effect (meaning this test can be used) for the duration of the COVID-19 declaration under Section 564(b)(1) of the Act, 21 U.S.C. section 360bbb-3(b)(1), unless the authorization is terminated or revoked.  Performed at [facility], [place] [address], [place], [place] [address]   Urine Culture     Status: Abnormal   Collection Time: 08/07/21  9:00 PM   Specimen: Urine, Clean Catch  Result Value Ref Range Status   Specimen Description URINE, CLEAN CATCH  Final   Special Requests NONE  Final   Culture (A)  Final    <10,000 COLONIES/mL INSIGNIFICANT GROWTH Performed at [facility], [place] [address] [place], [place] [address]    Report Status 08/09/2021 FINAL  Final     RN Pressure Injury Documentation:     Estimated body mass index is 59.7 kg/m as calculated from the following:   Height as of this encounter: 6\' 2"  (1.88 m).   Weight as of this encounter: 210.9 kg.  Malnutrition Type:   Malnutrition Characteristics:   Nutrition Interventions:    Radiology Studies: CT Head Wo Contrast  Result Date: 08/07/2021 CLINICAL DATA:  Mental status changes of unknown cause. EXAM: CT HEAD WITHOUT CONTRAST TECHNIQUE: Contiguous axial images were obtained from the base of the skull through the vertex without intravenous contrast. COMPARISON:  Head CT 04/27/2021. FINDINGS: Brain: No evidence of acute infarction, hemorrhage, hydrocephalus, extra-axial collection or mass lesion/mass effect. There is early cerebral atrophy and small vessel disease. Vascular: There are mild calcifications in the carotid siphons without hyperdense central vessels. Skull: Normal. Negative for fracture or focal lesion. Sinuses/Orbits: No acute finding. There is mild chronic membrane thickening in the maxillary and ethmoid sinuses without fluid level. Other sinuses and the bilateral mastoid air cells are clear. Other: None. IMPRESSION: Chronic  changes. No acute intracranial CT findings or interval changes. Electronically Signed   By: Telford Nab M.D.   On: 08/07/2021 21:15   DG Chest Port 1 View  Result Date: 08/09/2021 CLINICAL DATA:  Sudden onset of shortness of breath. EXAM: PORTABLE CHEST 1 VIEW COMPARISON:  08/09/2021 at 0500 hours and 08/08/2021 FINDINGS: Heart size is stable. Persistent streaky linear densities in lower lungs. Left basilar densities have slightly increased from the recent comparison examination. Upper lungs remain clear. Negative for a pneumothorax. Trachea is midline. IMPRESSION: Streaky linear densities at both lung bases. Slight progression at the left lung base. These lung densities are nonspecific but favor atelectasis. Electronically Signed   By: Markus Daft M.D.   On: 08/09/2021 10:47   DG CHEST PORT 1 VIEW  Result Date: 08/09/2021 CLINICAL DATA:  Shortness of breath. EXAM: PORTABLE CHEST 1 VIEW COMPARISON:  08/08/2021 FINDINGS: Stable cardiomediastinal contours. The lung volumes are low. Persistent subsegmental areas of atelectasis noted within both lung bases. The visualized osseous structures are unremarkable. IMPRESSION: Low lung volumes with persistent bibasilar atelectasis. Electronically Signed   By: Kerby Moors M.D.   On: 08/09/2021 07:12   DG CHEST PORT 1 VIEW  Result Date: 08/09/2021 CLINICAL DATA:  Chest pain EXAM: PORTABLE CHEST 1 VIEW COMPARISON:  08/07/2021 FINDINGS: Mild  parenchymal scarring at the right lung base is unchanged. Lungs are otherwise clear. No pneumothorax or pleural effusion. Mild cardiomegaly is stable. Pulmonary vascularity is normal. No acute bone abnormality. IMPRESSION: No active disease.  Stable cardiomegaly. Electronically Signed   By: Fidela Salisbury M.D.   On: 08/09/2021 00:30   EEG adult  Result Date: 08/09/2021 Lora Havens, MD     08/09/2021  2:34 PM Patient Name: Lawrence [name] MRN: [id number] Epilepsy Attending: Lora Havens Referring Physician/Provider:  Kerney Elbe, DO Date: 08/09/2021 Duration: 21.45 mins Patient history: 66yo M with shaking of arm and alteration of awareness as well as chest discomfort. EEG to evaluate for seizure. Level of alertness: Awake AEDs during EEG study: None Technical aspects: This EEG study was done with scalp electrodes positioned according to the 10-20 International system of electrode placement. Electrical activity was acquired at a sampling rate of 500Hz  and reviewed with a high frequency filter of 70Hz  and a low frequency filter of 1Hz . EEG data were recorded continuously and digitally stored. Description: The posterior dominant rhythm consists of 8 Hz activity of moderate voltage (25-35 uV) seen predominantly in posterior head regions, symmetric and reactive to eye opening and eye closing. Hyperventilation and photic stimulation were not performed.   IMPRESSION: This study is within normal limits. No seizures or epileptiform discharges were seen throughout the recording. Priyanka Barbra Sarks    Scheduled Meds:  apixaban  5 mg Oral BID   arformoterol  15 mcg Nebulization BID   And   umeclidinium bromide  1 puff Inhalation Daily   ARIPiprazole  5 mg Oral Daily   atorvastatin  40 mg Oral QPM   busPIRone  5 mg Oral TID   [START ON 08/10/2021] dapagliflozin propanediol  10 mg Oral QAC breakfast   diltiazem  180 mg Oral Daily   fluticasone  1 spray Each Nare Daily   insulin aspart  0-20 Units Subcutaneous TID WC   isosorbide mononitrate  30 mg Oral Daily   metoprolol succinate  50 mg Oral Daily   pantoprazole  40 mg Oral Daily   prazosin  2 mg Oral QHS   pregabalin  150 mg Oral TID   sodium chloride flush  3 mL Intravenous Q12H   spironolactone  25 mg Oral Daily   temazepam  15 mg Oral QHS   [START ON 08/10/2021] torsemide  40 mg Oral QPM   torsemide  60 mg Oral Daily   traZODone  200 mg Oral QHS   venlafaxine XR  225 mg Oral Q breakfast   Continuous Infusions:  azithromycin Stopped (08/08/21 2308)   ceFEPime  (MAXIPIME) IV 2 g (08/09/21 1131)    LOS: 1 day   Kerney Elbe, DO [facility] PAGER is on AMION  If 7PM-7AM, please contact night-coverage [email or web address]

## 2021-08-09 NOTE — Progress Notes (Signed)
[facility] night cross cover note:  I was notified by RN of the patient's request for resumption of home scheduled intranasal Flonase.  I subsequently placed order resuming this medication, with first dose now.     Babs Bertin, DO Hospitalist

## 2021-08-09 NOTE — Progress Notes (Addendum)
Notified Dr. Alfredia Ferguson via [place] that patient is having chest pain- pressure like feeling, arm shakes, HR 125,  and slow to respond.  Dr. Alfredia Ferguson said vitals are stable so he would order some tests and see patient later.   Notified charge RN (Rapid is in another Code on the floor currently).  Monitoring patient in the meantime, as this is similar to last night's episode and pre-seizure activities for patient.   Completed stroke assessment.   **The above was written by the Nurse Allyne Gee and below is my response to her Page  I was at bedside within 5 minutes to see the patient after the nurse had notified me about his chest discomfort and pressure as well as his arm shakes and he was drowsy still having some chest pain rated a 5 out of 10.  Per the nurse he had a similar episode overnight which resolved spontaneously.  Patient's vital signs were stable but he was a little tachycardic but he did have good blood pressure.  I ordered nitroglycerin,  EKG and stat troponins as well as chest x-ray ordered.  Cardiology was notified and I spoke with Dr. Gwyndolyn Kaufman who states that cardiology see the patient given his cardiac history.  Patient was noted to have some drinking earlier and was slow to respond so I have notified neurology Dr. Quinn Axe for a formal consultation via [place] and order an EEG as well as as needed lorazepam and seizure precautions.    Family at bedside states that this has happened before and it is chest pain has become more more frequently and that he was supposed to have an outpatient catheterization at some point with his cardiology team.  Patient does wear BiPAP at night and we will order it for him nightly and family states that when he does have these episodes he gets placed on BiPAP and takes a nap..  Troponins overnight when he had the similar episode at midnight were flat and will repeat again.  Patient is more responsive and is coming around back to his baseline and will  have cardiology and neurology further evaluate and treat

## 2021-08-09 NOTE — Progress Notes (Signed)
EEG complete - results pending 

## 2021-08-09 NOTE — Plan of Care (Signed)

## 2021-08-09 NOTE — Procedures (Signed)
Patient Name: Lawrence [name]  MRN: [id number]  Epilepsy Attending: Lora Havens  Referring Physician/Provider: Kerney Elbe, DO Date: 08/09/2021 Duration: 21.45 mins  Patient history: 66yo M with shaking of arm and alteration of awareness as well as chest discomfort. EEG to evaluate for seizure.   Level of alertness: Awake  AEDs during EEG study: None  Technical aspects: This EEG study was done with scalp electrodes positioned according to the 10-20 International system of electrode placement. Electrical activity was acquired at a sampling rate of 500Hz  and reviewed with a high frequency filter of 70Hz  and a low frequency filter of 1Hz . EEG data were recorded continuously and digitally stored.   Description: The posterior dominant rhythm consists of 8 Hz activity of moderate voltage (25-35 uV) seen predominantly in posterior head regions, symmetric and reactive to eye opening and eye closing. Hyperventilation and photic stimulation were not performed.     IMPRESSION: This study is within normal limits. No seizures or epileptiform discharges were seen throughout the recording.  [name]

## 2021-08-09 NOTE — Progress Notes (Signed)
08/08/2021 approx 2320: Pt c/o chest pressure and left chest pain. Pt mentation also noted to be different from previous assessment. Pt able to answer questions and follow commands but reports feeling "off". Pt noted to stare directly forward when eyes open, but still responding verbally with delayed responses. Pt drowsy at this time and restless in the bed, constantly moving his BLE, intermittently grabbing his chest and grimacing. Rapid Response called, MD paged. EKG completed, nitro x2 administered, VSS. Pt reports relief of chest pressure and brief relief of chest pain after nitro, but pain returned soon after. Pt CPAP applied, but pt grew increasingly restless and unable to tolerate mask. Pt given 1xdose toradol for pain, CPAP removed, pt back on [place]@5L  - pt calmer and more comfortable. Pt mentation slowly began to improve and patient no longer drowsy, restless or lethargic. Pt now back at baseline, conversing normally, awake and alert, making eye contact and watching tv.

## 2021-08-09 NOTE — Progress Notes (Signed)
TRH night cross cover note:  Regarding this patient with a reported history of seizure disorder, COPD, who is hospitalized for acute hypoxic respiratory failure in the setting of acute COPD exacerbation and bibasilar pneumonia, I was notified by RN that the patient is complaining of left basilar/lateral chest discomfort, described as pressure, that is not reproducible with direct palpation of the left anterior chest wall, and was associated with worsening shortness of breath.  This episode of chest pain was also associated with some concern for departure from baseline mental status.  Vital signs stable at the time of this episode, including no increase in supplemental oxygen demands relative to prior.   There was some improvement in the associated intensity of this chest discomfort with 2 doses of sublingual nitroglycerin, with chest pain ultimately resolving following dose of IV Toradol.  Stat EKG showed sinus rhythm with single PVC, and no evidence of acute ischemic changes, including no evidence of ST elevation.  Stat chest x-ray, relative to that performed at the time of admission, showed no interval changes, including no evidence of edema or pneumothorax.   In the setting of the presence of PVCs on EKG as well as telemetry revealing intermittent PVCs, a.m. labs were performed early, and were notable for serum magnesium level of 2.1 as well as serum potassium level 3.4.  Consequently, I ordered potassium chloride 40 mill equivalents p.o. x1 dose now.  Stat troponin as well as follow-up troponin in the a.m. were ordered, with results currently pending.  In the setting of being hospitalized for acute COPD exacerbation and concern for intermittent confusion, I also ordered VBG, which showed no evidence of uncompensated hypercapnia.   In terms of management of his underlying pneumonia, he remains on cefepime and azithromycin. In considering other potential etiologies leading to his left basilar/lateral chest  wall discomfort, considered the possibility of acute pulmonary embolism, although this is felt to be less likely in the setting of chronic anticoagulation via Eliquis. Differential includes pleuritic discomfort as a consequence of left basilar pneumonia.     Babs Bertin, DO Hospitalist

## 2021-08-09 NOTE — Consult Note (Signed)
Neurology Consultation  Reason for Consult: right hand jerking.  Referring Physician: O. Sheikh, DO.   CC: right hand jerking.   History is obtained from: patient, chart.   HPI: Lawrence [name] is a 66 y.o. male with a PMHx of morbid obesity, OSA, AF on Eliquis, SVT, BPH, venous stasis, COPD, PTSD, depression, CdHF, HTN, HLD, right hand focal seizures, and DM with peripheral neuropathy. Lawrence [name] is very familiar to the neurology service secondary to multiple hospitalizations for same right hand jerking. He has had multiple negative EEGs. At one point, he was put on Keppra 1 gm po bid. This worked, but he self d/c'd it. Had no further right hand jerking at that time. He states he had a home sleep study last week, but does not know results. He states his right hand jerking occurs more when he doesn't sleep well. The insomnia is chronic. He has had some chest pain relieved by NTG.   08/07/21 , presented with fever and AMS. CXR concerning for PNA, so empiric antibiotics were started. Was hypoxic and placed on O2. He had an episode of RUE>LUE jerking for which neurology is consulted.  He had 2 episodes of bilateral UE shaking with prolonged post ictal period in 3/22. He was started on Keppra at that time with continued Temazepam and Pregabalin. He was referred to out patient neuro for possible taper and d/c of Keppra at that time. There are no notes in chart from 3/22 to 6/22 when he was readmitted to hospital. His right hand jerking was associated with whether he slept well or not. Since then he was seen by [place] for same June, Jul, Sept, Oct 2022.  He did well on the [place] and would rather be on a med than to have the seizures.   His EEG today is without epileptiform discharges or seizures.   ROS: A robust ROS was performed and is negative except as noted in the HPI.    Past Medical History:  Diagnosis Date   Acquired dilation of ascending aorta and aortic root ([place])    38mm by echo 01/2021    Adenomatous colon polyp 2007   Anemia    Anxiety    Aortic stenosis    moderate AS by echo 01/2021   Asthma    BPH without obstruction/lower urinary tract symptoms 02/22/2017   Chronic diastolic (congestive) heart failure (HCC)    Chronic venous stasis 03/07/2019   COPD (chronic obstructive pulmonary disease) (HCC)    Coronary artery calcification seen on CAT scan    Depression    Diabetic neuropathy ([place]) 09/11/2019   History of colon polyps 08/24/2018   Hypertension    Morbid obesity (HCC)    OSA (obstructive sleep apnea)    Pain due to onychomycosis of toenails of both feet 09/11/2019   Peripheral neuropathy 02/22/2017   Primary osteoarthritis, left shoulder 03/05/2017   PTSD (post-traumatic stress disorder)    Pure hypercholesterolemia    QT prolongation 03/07/2019   Seizures (HCC)    Shortness of breath    Sinus tachycardia 03/07/2019   Sleep apnea    CPAP   Type 2 diabetes mellitus with vascular disease ([place]) 09/11/2019   Family History  Problem Relation Age of Onset   CAD Maternal Grandfather    Diabetes Other    Diabetes Mellitus II Neg Hx    Colon cancer Neg Hx    Esophageal cancer Neg Hx    Inflammatory bowel disease Neg Hx    Liver disease  Neg Hx    Pancreatic cancer Neg Hx    Rectal cancer Neg Hx    Stomach cancer Neg Hx    Sleep apnea Neg Hx    Social History:   reports that he quit smoking about 5 years ago. His smoking use included cigarettes. He has a 44.00 pack-year smoking history. He has been exposed to tobacco smoke. He has never used smokeless tobacco. He reports current alcohol use. He reports that he does not use drugs.  Medications  Current Facility-Administered Medications:    acetaminophen (TYLENOL) tablet 650 mg, 650 mg, Oral, Q6H PRN, 650 mg at 08/09/21 1458 **OR** acetaminophen (TYLENOL) suppository 650 mg, 650 mg, Rectal, Q6H PRN, Marcelyn Bruins, MD   albuterol (PROVENTIL) (2.5 MG/3ML) 0.083% nebulizer solution 2.5 mg, 2.5 mg,  Nebulization, Q2H PRN, Marcelyn Bruins, MD   apixaban Arne [place]) tablet 5 mg, 5 mg, Oral, BID, Marcelyn Bruins, MD, 5 mg at 08/09/21 0825   arformoterol (BROVANA) nebulizer solution 15 mcg, 15 mcg, Nebulization, BID, 15 mcg at 08/09/21 0809 **AND** umeclidinium bromide (INCRUSE ELLIPTA) 62.5 MCG/ACT 1 puff, 1 puff, Inhalation, Daily, Marcelyn Bruins, MD, 1 puff at 08/08/21 0849   ARIPiprazole (ABILIFY) tablet 5 mg, 5 mg, Oral, Daily, Marcelyn Bruins, MD, 5 mg at 08/09/21 0825   atorvastatin (LIPITOR) tablet 40 mg, 40 mg, Oral, QPM, Marcelyn Bruins, MD, 40 mg at 08/08/21 1742   azithromycin (ZITHROMAX) 500 mg in sodium chloride 0.9 % 250 mL IVPB, 500 mg, Intravenous, Q24H, Marcelyn Bruins, MD, Stopped at 08/08/21 2308   busPIRone (BUSPAR) tablet 5 mg, 5 mg, Oral, TID, Sheikh, Omair Latif, DO, 5 mg at 08/09/21 1528   ceFEPIme (MAXIPIME) 2 g in sodium chloride 0.9 % 100 mL IVPB, 2 g, Intravenous, Q8H, Marcelyn Bruins, MD, Last Rate: 200 mL/hr at 08/09/21 1131, 2 g at 08/09/21 1131   [START ON 08/10/2021] dapagliflozin propanediol (FARXIGA) tablet 10 mg, 10 mg, Oral, QAC breakfast, Sheikh, Omair Latif, DO   diazepam (VALIUM) tablet 5 mg, 5 mg, Oral, Once PRN, Sheikh, Omair Latif, DO   diltiazem (CARDIZEM CD) 24 hr capsule 180 mg, 180 mg, Oral, Daily, Sheikh, Omair Latif, DO, 180 mg at 08/09/21 1019   fluticasone (FLONASE) 50 MCG/ACT nasal spray 1 spray, 1 spray, Each Nare, Daily, Howerter, Justin B, DO, 1 spray at 08/09/21 0504   insulin aspart (novoLOG) injection 0-20 Units, 0-20 Units, Subcutaneous, TID WC, Marcelyn Bruins, MD, 4 Units at 08/09/21 1213   isosorbide mononitrate (IMDUR) 24 hr tablet 30 mg, 30 mg, Oral, Daily, Sheikh, Omair Latif, DO, 30 mg at 08/09/21 1018   LORazepam (ATIVAN) injection 2 mg, 2 mg, Intravenous, Q4H PRN, Sheikh, Omair Latif, DO   metoprolol succinate (TOPROL-XL) 24 hr tablet 50 mg, 50 mg, Oral, Daily, Marcelyn Bruins, MD, 50 mg at 08/09/21  0825   naloxone [facility]) injection 0.4 mg, 0.4 mg, Intravenous, PRN, Howerter, Justin B, DO   nitroGLYCERIN (NITROSTAT) SL tablet 0.4 mg, 0.4 mg, Sublingual, Q5 min PRN, Sheikh, Omair Latif, DO, 0.4 mg at 08/09/21 1019   oxyCODONE (Oxy IR/ROXICODONE) immediate release tablet 10 mg, 10 mg, Oral, Q8H PRN, Howerter, Justin B, DO, 10 mg at 08/09/21 1135   pantoprazole (PROTONIX) EC tablet 40 mg, 40 mg, Oral, Daily, Marcelyn Bruins, MD, 40 mg at 08/09/21 0824   polyethylene glycol (MIRALAX / GLYCOLAX) packet 17 g, 17 g, Oral, Daily PRN, Marcelyn Bruins, MD   prazosin (MINIPRESS) capsule 2 mg,  2 mg, Oral, QHS, Marcelyn Bruins, MD, 2 mg at 08/08/21 2202   pregabalin (LYRICA) capsule 150 mg, 150 mg, Oral, TID, Marcelyn Bruins, MD, 150 mg at 08/09/21 1528   sodium chloride flush (NS) 0.9 % injection 3 mL, 3 mL, Intravenous, Q12H, Marcelyn Bruins, MD, 3 mL at 08/09/21 2355   spironolactone (ALDACTONE) tablet 25 mg, 25 mg, Oral, Daily, Marcelyn Bruins, MD, 25 mg at 08/09/21 0825   temazepam (RESTORIL) capsule 15 mg, 15 mg, Oral, QHS, Wise, Shelby S, RPH, 15 mg at 08/08/21 2201   [START ON 08/10/2021] torsemide (DEMADEX) tablet 40 mg, 40 mg, Oral, QPM, Pemberton, Greer Ee, MD   torsemide (DEMADEX) tablet 60 mg, 60 mg, Oral, Daily, Freada Bergeron, MD, 60 mg at 08/09/21 1458   traZODone (DESYREL) tablet 200 mg, 200 mg, Oral, QHS, Marcelyn Bruins, MD, 200 mg at 08/08/21 2201   venlafaxine XR (EFFEXOR-XR) 24 hr capsule 225 mg, 225 mg, Oral, Q breakfast, Raiford Noble [place], DO, 225 mg at 08/09/21 0825   Exam: Current vital signs: BP 118/85 (BP Location: Left Arm)    Pulse 88    Temp 98.9 F (37.2 C) (Oral)    Resp 16    Ht 6\' 2"  (1.88 m)    Wt (!) 210.9 kg    SpO2 93%    BMI 59.70 kg/m  Vital signs in last 24 hours: Temp:  [97.6 F (36.4 C)-100.3 F (37.9 C)] 98.9 F (37.2 C) (01/02 1406) Pulse Rate:  [84-127] 88 (01/02 1406) Resp:  [15-21] 16 (01/02 1406) BP:  (112-143)/(50-90) 118/85 (01/02 1406) SpO2:  [90 %-98 %] 93 % (01/02 1406) Weight:  [173.3 kg-210.9 kg] 210.9 kg (01/02 0506)  PE: GENERAL: Chronically morbidly abuse, Awake, alert in NAD.  HEENT: normocephalic and atraumatic. LUNGS - Normal respiratory effort.  CV - RRR on tele. ABDOMEN - Soft, nontender. Ext: warm. Brownish discoloration from mid tibias distally to foot.  Psych: affect light.   NEURO:  Mental Status: Alert and oriented x4.  Speech/Language: speech is without dysarthria or aphasia.  Naming, repetition, fluency, and comprehension intact.  Cranial Nerves:  II: PERRL 3 mm/brisk. visual fields full.  III, IV, VI: EOMI. Lid elevation symmetric and full.  V: sensation is intact and symmetrical to face.  VII: Smile is symmetrical.  VIII:hearing intact to voice. IX, X: palate elevation is symmetric. Phonation normal.  XI: normal sternocleidomastoid and trapezius muscle strength. DDU:KGURKY is symmetrical without fasciculations.   Motor:  His LE exam is hindered by his body habitus. He can lift his RLE off bed with drift. He can not lift his LLE at all. He is able to move his right foot with plantar stimulation. No movement with stimulation to LLE. His BUE strength is 5/5.  Tone is normal. Bulk is increased.  Sensation- Intact to light touch bilaterally in all four extremities. His sensation to light touch is decreased from mid tibia to foot on RLE and from knee to foot on LLE (neuropathy).  Coordination: FTN intact bilaterally. Unable to perform HKS. No pronator drift UEs.   DTRs:  RUE:  biceps      brachioradialis 1     triceps RLE:  patella  0    tibial LUE:  biceps     brachioradialis 1    triceps LLE: patella  0   tibial Gait- deferred.  Labs I have reviewed labs in epic and the results pertinent to this consultation are: CBC  Component Value Date/Time   WBC 11.7 (H) 08/09/2021 0006   RBC 4.24 08/09/2021 0006   HGB 12.7 (L) 08/09/2021 0006   HCT 38.8 (L)  08/09/2021 0006   PLT 202 08/09/2021 0006   MCV 91.5 08/09/2021 0006   MCH 30.0 08/09/2021 0006   MCHC 32.7 08/09/2021 0006   RDW 15.8 (H) 08/09/2021 0006   LYMPHSABS 1.4 08/09/2021 0006   MONOABS 0.9 08/09/2021 0006   EOSABS 0.2 08/09/2021 0006   BASOSABS 0.0 08/09/2021 0006    CMP     Component Value Date/Time   NA 137 08/09/2021 0006   NA 136 01/07/2021 0000   K 3.4 (L) 08/09/2021 0006   CL 99 08/09/2021 0006   CO2 28 08/09/2021 0006   GLUCOSE 186 (H) 08/09/2021 0006   BUN 17 08/09/2021 0006   BUN 22 01/07/2021 0000   CREATININE 1.16 08/09/2021 0006   CALCIUM 8.4 (L) 08/09/2021 0006   PROT 6.2 (L) 08/09/2021 0006   ALBUMIN 3.0 (L) 08/09/2021 0006   AST 18 08/09/2021 0006   ALT 22 08/09/2021 0006   ALKPHOS 59 08/09/2021 0006   BILITOT 0.7 08/09/2021 0006   GFRNONAA >60 08/09/2021 0006   GFRAA 77 05/20/2020 1038    Imaging MD reviewed the images obtained.  CT head No acute changes.   EEG This study is within normal limits. No seizures or epileptiform discharges were seen throughout the recording.  Assessment: 66 yo male with multiple chronic comorbidities. He is c/o right hand jerking that he has c/o during most of his hospitalizations x one year. Was on Keppra and self d/c'd it. Again, he continues not to sleep well even with Cpap. His jerking motions occur more with insomnia. Suspect the poor responsiveness yesterday is more from sleepiness/lack of sleep than it is post ictal behavior. Sleep home study is pending. He had no RUE jerking on exam tonight.  Recommendations/Plan:  -partial seizures noted in past, improved on keppra. Restart 500mg  bid. -He is on Lyrica and Temazepam.  -seizure precautions.  -deconditioning-PT/OT -insomnia-consider psych consult (has seen in past).  -PNA per medicine team.   Pt seen by Clance Boll, NP/Neuro and later by MD. Note/plan to be edited by MD as needed.  Pager: [phone]  Neurology Attending Attestation   I  examined the patient and discussed plan with NP. Above note has been edited by me to reflect my findings and recommendations.    Lawrence Monks, MD [facility] [phone]   If 7pm- 7am, please page neurology on call as listed in [place].

## 2021-08-09 NOTE — Consult Note (Addendum)
Cardiology Consultation:   Patient ID: [name] MRN: [id number]; DOB: 18-Apr-1956  Admit date: 08/07/2021 Date of Consult: 08/09/2021  PCP:  Lawrence Stalker, PA-C   [facility] Providers Cardiologist:  Lawrence Him, MD  Electrophysiologist:  Lawrence Haw, MD  Advanced Heart Failure:  Lawrence Champagne, MD  {   Patient Profile:   Lawrence [name] is a 66 y.o. male with a hx of mild non obstructive CAD, hypertension, hyperlipidemia, DM type II, COPD,  paroxysmal atrial fibrillation on Eliquis, chronic diastolic CHF, moderate aortic stenosis, seizure disorder, OSA on BiPAP, gout, and depression who is being seen 08/09/2021 for the evaluation of chest pain at the request of Dr. Alfredia [name].  LHC from 01/17/2018 revealed proximal LAD to mid LAD lesion 40% stenosis, proximal Cx to mid Cx lesion 20% stenosis, Ost 2nd Mrg lesion 205 stenosis.  He had recurrent complaint of chest pain which led to a Myoview on 11/24/2020, showed a low risk study that is normal.   He was hospitalized in March 2022 for acute hypoxic respiratory failure and acute on chronic diastolic heart failure.  Was diuresed 18 L placed on 1200 fluid restriction for hyponatremia.  Telemetry during the hospitalization showed SVT, could not discern whether it was afib or PAT. He was maintained on metoprolol.    He initially presented to ER on 02/19/2019 with heart palpitation, subsequent Zio patch revealed multiple episodes of SVT.  He was referred to EP Dr. Curt [name] 04/2019, subsequently placed on higher dose metoprolol for symptom control.  He wore a monitor after March 2022 hospitalization for CHF, which revealed short episodes of atrial fibrillation.  Later he followed up with EP on 12/09/2020, reportedly had 1% burden of A. fib from his monitor.  He was initiated on anticoagulation with Eliquis 5 mg twice daily and stopped aspirin.   Echo 06/2021 with EF 60-65%, mild LVH, mildly dilated LV, moderate aortic stenosis (trileaflet  aortic valve) with mean gradient 22 mmHg, DI 0.27.  Established care with Dr. Aundra [name] 07/13/21 for evaluation of CHF due multiple admission for CHF exacerbation. Increased torsemide to 60 qam/40 qpm. Plan to arrange RHC to assess volume and TEE to assess aortic stenosis but hasn't done yet.   History of Present Illness:   [name] presented 12/31 with AMS, weakness and fever. He was found to be febrile at 102.4 and hypoxic. Admitted for severe sepsis & acute hypoxic respiratory failure 2nd to pneumonia and COPD exacerbation. Treated with abx and duoneb. Rapid response called around mid night foe episode of chest pressure with drowseness. Given SL x 2 with improved pain but reoccurred. She was then applied CPAP and given Toradol x 1 with resolved pain. EKG with acute changes. K was 3.4>> given supplement  Patient had another chest pain episode this morning (5/10 pain). Patient is also noted to have pre-seizure activity with slow to respond and neurology is consult.   Hs-troponin negative x 2.  K 3.4 Scr 1.16 Hgb 12.7 WBC 11.7  Wife and sister are at bedside.  Provided additional history.  Per wife patient gets tremors in his right upper extremity greater than left with his chest pain episode.  Then he rolled up his eyes and then become confused.  It takes few hours to get [name] to baseline.  His chest pain exacerbation with cough and deep breath.  He reports Toradol helped more than nitroglycerin.  Past Medical History:  Diagnosis Date   Acquired dilation of ascending aorta and aortic root ([place])  24mm by echo 01/2021   Adenomatous colon polyp 2007   Anemia    Anxiety    Aortic stenosis    moderate AS by echo 01/2021   Asthma    BPH without obstruction/lower urinary tract symptoms 02/22/2017   Chronic diastolic (congestive) heart failure (HCC)    Chronic venous stasis 03/07/2019   COPD (chronic obstructive pulmonary disease) (HCC)    Coronary artery calcification seen on CAT scan     Depression    Diabetic neuropathy ([place]) 09/11/2019   History of colon polyps 08/24/2018   Hypertension    Morbid obesity (HCC)    OSA (obstructive sleep apnea)    Pain due to onychomycosis of toenails of both feet 09/11/2019   Peripheral neuropathy 02/22/2017   Primary osteoarthritis, left shoulder 03/05/2017   PTSD (post-traumatic stress disorder)    Pure hypercholesterolemia    QT prolongation 03/07/2019   Seizures (HCC)    Shortness of breath    Sinus tachycardia 03/07/2019   Sleep apnea    CPAP   Type 2 diabetes mellitus with vascular disease ([place]) 09/11/2019    Past Surgical History:  Procedure Laterality Date   ENDOVENOUS ABLATION SAPHENOUS VEIN W/ LASER Right 08/20/2020   endovenous laser ablation right greater saphenous vein by Lawrence Gallop MD    JOINT REPLACEMENT     left knee replacement x 2   KNEE ARTHROSCOPY Bilateral    LEFT HEART CATH AND CORONARY ANGIOGRAPHY N/A 01/17/2018   Procedure: LEFT HEART CATH AND CORONARY ANGIOGRAPHY;  Surgeon: Lawrence Man, MD;  Location: [facility];  Service: Cardiovascular;  Laterality: N/A;   UMBILICAL HERNIA REPAIR       Inpatient Medications: Scheduled Meds:  apixaban  5 mg Oral BID   arformoterol  15 mcg Nebulization BID   And   umeclidinium bromide  1 puff Inhalation Daily   ARIPiprazole  5 mg Oral Daily   atorvastatin  40 mg Oral QPM   busPIRone  5 mg Oral TID   [START ON 08/10/2021] dapagliflozin propanediol  10 mg Oral QAC breakfast   diltiazem  180 mg Oral Daily   fluticasone  1 spray Each Nare Daily   insulin aspart  0-20 Units Subcutaneous TID WC   isosorbide mononitrate  30 mg Oral Daily   metoprolol succinate  50 mg Oral Daily   pantoprazole  40 mg Oral Daily   prazosin  2 mg Oral QHS   pregabalin  150 mg Oral TID   sodium chloride flush  3 mL Intravenous Q12H   spironolactone  25 mg Oral Daily   temazepam  15 mg Oral QHS   torsemide  40 mg Oral BID   traZODone  200 mg Oral QHS   venlafaxine XR   225 mg Oral Q breakfast   Continuous Infusions:  azithromycin Stopped (08/08/21 2308)   ceFEPime (MAXIPIME) IV 2 g (08/09/21 1131)   PRN Meds: acetaminophen **OR** acetaminophen, albuterol, diazepam, LORazepam, naLOXone (NARCAN)  injection, nitroGLYCERIN, oxyCODONE, polyethylene glycol  Allergies:    Allergies  Allergen Reactions   Vancomycin Other (See Comments)    "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: possible cause for rash under both arms   Niacin Other (See Comments)   Niacin And Related Other (See Comments)    Red [name] syndrome   Tubersol [Tuberculin, Ppd] Other (See Comments)    Reaction unknown   Doxycycline Rash and Other (See Comments)    Social History:   Social History   Socioeconomic History  Marital status: Significant Other    Spouse name: [name]   Number of children: 1   Years of education: Not on file   Highest education level: Associate degree: academic program  Occupational History   Occupation: retired    Comment: Artist  Tobacco Use   Smoking status: Former    Packs/day: 1.00    Years: 44.00    Pack years: 44.00    Types: Cigarettes    Quit date: 03/07/2016    Years since quitting: 5.4    Passive exposure: Past   Smokeless tobacco: Never  Vaping Use   Vaping Use: Never used  Substance and Sexual Activity   Alcohol use: Yes    Comment: rare   Drug use: No   Sexual activity: Not on file  Other Topics Concern   Not on file  Social History Narrative   Lives with wife [name]   Right Handed   Drinks 1-2 cups caffeine   Social Determinants of Health   Financial Resource Strain: Medium Risk   Difficulty of Paying Living Expenses: Somewhat hard  Food Insecurity: Food Insecurity Present   Worried About Charity fundraiser in the Last Year: Sometimes true   Ran Out of Food in the Last Year: Never true  Transportation Needs: Unmet Transportation Needs   Lack of Transportation (Medical): Yes   Lack of Transportation (Non-Medical): No   Physical Activity: Not on file  Stress: Not on file  Social Connections: Not on file  Intimate Partner Violence: Not on file    Family History:    Family History  Problem Relation Age of Onset   CAD Maternal Grandfather    Diabetes Other    Diabetes Mellitus II Neg Hx    Colon cancer Neg Hx    Esophageal cancer Neg Hx    Inflammatory bowel disease Neg Hx    Liver disease Neg Hx    Pancreatic cancer Neg Hx    Rectal cancer Neg Hx    Stomach cancer Neg Hx    Sleep apnea Neg Hx      ROS:  Please see the history of present illness.  All other ROS reviewed and negative.     Physical Exam/Data:   Vitals:   08/09/21 0818 08/09/21 0941 08/09/21 1019 08/09/21 1041  BP: (!) 114/50 130/82 (!) 143/83 116/78  Pulse: 95 (!) 126  (!) 122  Resp: 18   19  Temp: 98.6 F (37 C)   98.9 F (37.2 C)  TempSrc: Oral   Oral  SpO2: 94% 92%  91%  Weight:      Height:        Intake/Output Summary (Last 24 hours) at 08/09/2021 1138 Last data filed at 08/09/2021 1128 Gross per 24 hour  Intake 2716.67 ml  Output 4480 ml  Net -1763.33 ml   Last 3 Weights 08/09/2021 08/08/2021 08/08/2021  Weight (lbs) 465 lb 382 lb 0.9 oz 382 lb 0.9 oz  Weight (kg) 210.923 kg 173.3 kg 173.3 kg  Some encounter information is confidential and restricted. Go to Review Flowsheets activity to see all data.     Body mass index is 59.7 kg/m.  General: Ill-appearing morbidly obese male in no acute distress HEENT: normal Neck: JVD difficult to evaluate Vascular: No carotid bruits; Distal pulses 2+ bilaterally Cardiac:  normal S1, S2; RRR; 3/6 systolic murmur Lungs:  clear to auscultation bilaterally, no wheezing, rhonchi or rales  Abd: soft, nontender, no hepatomegaly  Ext: Trace edema Musculoskeletal:  No deformities, BUE and BLE strength normal and equal Skin: warm and dry  Neuro:  no focal abnormalities noted Psych:  Normal affect   EKG:  The EKG was personally reviewed and demonstrates:  afib at  131 Telemetry:  Telemetry was personally reviewed and demonstrates: Atrial fibrillation, PVC, ventricular trigeminy  Relevant CV Studies:  Echo 06/08/21 1. Left ventricular ejection fraction, by estimation, is 60 to 65%. The  left ventricle has normal function. The left ventricle has no regional  wall motion abnormalities. The left ventricular internal cavity size was  mildly dilated. There is mild  concentric left ventricular hypertrophy. Left ventricular diastolic  parameters were normal.   2. Right ventricular systolic function is normal. The right ventricular  size is normal.   3. The mitral valve is normal in structure. Trivial mitral valve  regurgitation.   4. AV is thickened, calcified Difficult to see well Peak and mean  gradients through the valve arer 46 and 22 mm Hg respectively  Dimensionless index is 0.27 consistent with moderate AS. Compared to echo  from June 2022, mild increase in mean gradient (19  to 22 mm HG). The aortic valve is tricuspid. Aortic valve regurgitation is  not visualized.   Monitor 12/2020 Predominant rhythm is sinus tachycardia with heart rate average 101bpm and ranged from 75 to 158bpm. Frequent bouts of paroxysmal atrial fibrillation and flutter with variable conduction. Freqent PACs, bigeminal PACs and atrial couplets. SVT up to 9 seconds in duration ? atrial flutter with 2:1 conduction vs. atrial tachycardia. Occasional PVCs, bigeminal and trigeminal PVCs, ventricular couplets and wide complex tachycardia up to 5 beats in a row PVC load < 1%.    Stress test 11/2020 Nuclear stress EF: 55%. The left ventricular ejection fraction is normal (55-65%). There was no ST segment deviation noted during stress. The study is normal. This is a low risk study.   Laboratory Data:  High Sensitivity Troponin:   Recent Labs  Lab 07/20/21 1222 07/20/21 1450 08/09/21 0006 08/09/21 0610 08/09/21 1004  TROPONINIHS 6 9 9 8 8      Chemistry Recent Labs   Lab 08/07/21 1847 08/07/21 2003 08/08/21 0201 08/09/21 0006  NA 135 135   136 136 137  K 4.4 4.2   4.3 3.9 3.4*  CL 97* 100 99 99  CO2 27  --  27 28  GLUCOSE 175* 167* 144* 186*  BUN 21 24* 19 17  CREATININE 1.16 1.00 0.97 1.16  CALCIUM 9.5  --  8.9 8.4*  MG  --   --   --  2.1  GFRNONAA >60  --  >60 >60  ANIONGAP 11  --  10 10    Recent Labs  Lab 08/07/21 1847 08/08/21 0201 08/09/21 0006  PROT 7.8 7.0 6.2*  ALBUMIN 3.8 3.4* 3.0*  AST 23 19 18   ALT 26 25 22   ALKPHOS 85 74 59  BILITOT 1.0 0.9 0.7   Hematology Recent Labs  Lab 08/07/21 1847 08/07/21 2003 08/08/21 0201 08/09/21 0006  WBC 25.6*  --  23.5* 11.7*  RBC 5.05  --  4.70 4.24  HGB 14.9 16.0   15.6 14.1 12.7*  HCT 46.4 47.0   46.0 43.4 38.8*  MCV 91.9  --  92.3 91.5  MCH 29.5  --  30.0 30.0  MCHC 32.1  --  32.5 32.7  RDW 15.5  --  15.6* 15.8*  PLT 263  --  239 202   BNP Recent Labs  Lab 08/07/21 1847  BNP 46.5    Radiology/Studies:  CT Head Wo Contrast  Result Date: 08/07/2021 CLINICAL DATA:  Mental status changes of unknown cause. EXAM: CT HEAD WITHOUT CONTRAST TECHNIQUE: Contiguous axial images were obtained from the base of the skull through the vertex without intravenous contrast. COMPARISON:  Head CT 04/27/2021. FINDINGS: Brain: No evidence of acute infarction, hemorrhage, hydrocephalus, extra-axial collection or mass lesion/mass effect. There is early cerebral atrophy and small vessel disease. Vascular: There are mild calcifications in the carotid siphons without hyperdense central vessels. Skull: Normal. Negative for fracture or focal lesion. Sinuses/Orbits: No acute finding. There is mild chronic membrane thickening in the maxillary and ethmoid sinuses without fluid level. Other sinuses and the bilateral mastoid air cells are clear. Other: None. IMPRESSION: Chronic changes. No acute intracranial CT findings or interval changes. Electronically Signed   By: Telford Nab M.D.   On: 08/07/2021  21:15   DG Chest Port 1 View  Result Date: 08/09/2021 CLINICAL DATA:  Sudden onset of shortness of breath. EXAM: PORTABLE CHEST 1 VIEW COMPARISON:  08/09/2021 at 0500 hours and 08/08/2021 FINDINGS: Heart size is stable. Persistent streaky linear densities in lower lungs. Left basilar densities have slightly increased from the recent comparison examination. Upper lungs remain clear. Negative for a pneumothorax. Trachea is midline. IMPRESSION: Streaky linear densities at both lung bases. Slight progression at the left lung base. These lung densities are nonspecific but favor atelectasis. Electronically Signed   By: Markus Daft M.D.   On: 08/09/2021 10:47   DG CHEST PORT 1 VIEW  Result Date: 08/09/2021 CLINICAL DATA:  Shortness of breath. EXAM: PORTABLE CHEST 1 VIEW COMPARISON:  08/08/2021 FINDINGS: Stable cardiomediastinal contours. The lung volumes are low. Persistent subsegmental areas of atelectasis noted within both lung bases. The visualized osseous structures are unremarkable. IMPRESSION: Low lung volumes with persistent bibasilar atelectasis. Electronically Signed   By: Kerby Moors M.D.   On: 08/09/2021 07:12   DG CHEST PORT 1 VIEW  Result Date: 08/09/2021 CLINICAL DATA:  Chest pain EXAM: PORTABLE CHEST 1 VIEW COMPARISON:  08/07/2021 FINDINGS: Mild parenchymal scarring at the right lung base is unchanged. Lungs are otherwise clear. No pneumothorax or pleural effusion. Mild cardiomegaly is stable. Pulmonary vascularity is normal. No acute bone abnormality. IMPRESSION: No active disease.  Stable cardiomegaly. Electronically Signed   By: Fidela Salisbury M.D.   On: 08/09/2021 00:30   DG Chest Port 1 View  Result Date: 08/07/2021 CLINICAL DATA:  Questionable sepsis. EXAM: PORTABLE CHEST 1 VIEW COMPARISON:  Chest x-ray 02/08/2021. FINDINGS: There are patchy airspace opacities in the bilateral lung bases. There is no pleural effusion or pneumothorax identified. The cardiomediastinal silhouette is  stable, the heart is enlarged. No acute fractures are seen. IMPRESSION: 1. Bilateral lower lung airspace disease worrisome for pneumonia or edema. 2. Stable cardiomegaly. Electronically Signed   By: Ronney Asters M.D.   On: 08/07/2021 19:33     Assessment and Plan:   Chest pain - Mixed symptoms.  He has pleuritic symptoms.  His pain exacerbates with deep breath, cough and somewhat with palpation.  He reports Toradol helped more than nitroglycerin.  Also has seizure-like activity at that time.  Pending neurology evaluation. -He has mild nonobstructive CAD by cath in 2019.  Stress test 11/2020 was low risk. -Thankfully, his troponin is negative x3.  Rule out of ACS.  EKG without acute ischemic changes. -Suspect his symptoms likely related to seizure-like activity along with underlying lung issue. -We will get limited echocardiogram to rule  out pericardial effusion given pleuritic symptoms  2.  Atrial fibrillation with rapid ventricular rate -Patient went into atrial fibrillation this morning around 9 AM -Heart rate is slowing down -Continue Eliquis for anticoagulation -Continue Cardizem CD 180 mg daily  3.  Chronic diastolic heart failure -Volume status difficult to evaluate by exam -Initially hydrated then given IV Lasix yesterday -Now back on torsemide 40 mg twice daily (was on 60 mg a.m. and 40 mg p.m) -Continue spironolactone and Farxiga -Has pending right heart cath by Dr. Aundra [name]  4.  Hypokalemia 5.  PVC/ventricular bigeminy -Keep magnesium greater than 2 and potassium greater than 4 - Will give additional Kdur 47meq x 1   Otherwise per primary team    Risk Assessment/Risk Scores:     HEAR Score (for undifferentiated chest pain):  HEAR Score: 3  New York Heart Association (NYHA) Functional Class NYHA Class III  CHA2DS2-VASc Score = 4   This indicates a 4.8% annual risk of stroke. The patient's score is based upon: CHF History: 1 HTN History: 1 Diabetes History:  1 Stroke History: 0 Vascular Disease History: 1 Age Score: 0 Gender Score: 0         For questions or updates, please contact [place] Please consult [email or web address] for contact info under    [name], PA  08/09/2021 11:38 AM   Patient seen and examined and agree with Robbie Lis, PA as detailed above.  In brief, the patient is a 66 year old male with a hx of mild non obstructive CAD, hypertension, hyperlipidemia, DM type II, COPD,  paroxysmal atrial fibrillation on Eliquis, chronic diastolic CHF, moderate aortic stenosis, seizure disorder, OSA on BiPAP, gout, and depression who presented to the hospital with weakness, AMS and fever found to have pneumonia nad COPD exacerbation. Course complicated by chest pain and Afib with RVR for which Cardiology has been consulted.   Patient has known history of diastolic heart failure, moderate aortic stenosis, and Afib.  TTE 06/2021 with EF 60-65%, mild LVH, mildly dilated LV, moderate aortic stenosis (trileaflet aortic valve) with mean gradient 22 mmHg, DI 0.27. Given frequent admissions for HF, he was evaluated by Dr. Aundra [name] with plans for RHC and TEE to assess filling pressures and AS respectively. This has not yet been completed.  He presented here with acute hypoxic respiratory failure found to have pneumonia and COPD. During his hospitalization, he had episode of chest pain followed by shaking in his arms and altered mental status. Notably, has history of seizures and this is similar to seizures in the past. His chest pain preceded the development of Afib with RVR. Currently, the patient's pain has improved. He states it is worse with deep inspiration and with coughing. Pain improved with toradol with no significant response to SL NTG.  Suspect chest pain is pleuritic given worsening with cough and deep inspiration. Trop negative x2. ECG with NSR without acute ischemic changes.  Cath in 2019 that showed 40% prox-mid LAD, 20%  Lcx, 20% OM2. Myoview 11/2020 with normal EF, no ischemia or infarction. Also concerning that he has been having these episodes of shaking with AMS (?seziures). Neuro has been consulted. Given pleuritic nature of pain, will check limited TTE to assess for pericardial effusion as well as LVEF and wall motion. Will continue current home medications.   GEN: Morbidly obese male, sitting up in bed, comfortable and alert  Neck: JVD difficult to assess due to body habitus Cardiac: Tachycardic, irregular, 2/6 harsh systolic murmur Respiratory: Diminished  but clear. GI: Obese, soft MS: Trace edema; chronic venous stasis changes, warm Neuro:  Nonfocal  Psych: Normal affect    Plan: -Low suspicion for ischemic etiology of chest pain as appears pleuritic and had reassuring cath in 2019 and myoview 11/2020 -Will check limited TTE to assess for pericardial effusion, EF and WM -Continue home dilt and metop; if issues with rate control, can do amiodarone bolus +gtt -Continue home torsemide 60mg  qAM and 40mg  qPM -Continue apixaban 5mg  BID -Continue lipitor 40mg  PO daily -Continue spiro 25mg  daily, farxiga 10mg  daily -Patient will follow-up with HF as out-patient for RHC; does not need to be done as in-patient unless volume status is difficult to discern  -Planned for TEE as out-patient to further assess EF -Agree with Neuro consulted for shaking/AMS -Management of pneumonia and COPD per primary  Gwyndolyn Kaufman, MD

## 2021-08-10 ENCOUNTER — Inpatient Hospital Stay (HOSPITAL_COMMUNITY): Payer: No Typology Code available for payment source

## 2021-08-10 DIAGNOSIS — R569 Unspecified convulsions: Secondary | ICD-10-CM

## 2021-08-10 DIAGNOSIS — R0789 Other chest pain: Secondary | ICD-10-CM

## 2021-08-10 DIAGNOSIS — N400 Enlarged prostate without lower urinary tract symptoms: Secondary | ICD-10-CM | POA: Diagnosis not present

## 2021-08-10 DIAGNOSIS — R079 Chest pain, unspecified: Secondary | ICD-10-CM | POA: Diagnosis not present

## 2021-08-10 DIAGNOSIS — I5032 Chronic diastolic (congestive) heart failure: Secondary | ICD-10-CM | POA: Diagnosis not present

## 2021-08-10 DIAGNOSIS — A419 Sepsis, unspecified organism: Secondary | ICD-10-CM | POA: Diagnosis not present

## 2021-08-10 DIAGNOSIS — I480 Paroxysmal atrial fibrillation: Secondary | ICD-10-CM | POA: Diagnosis not present

## 2021-08-10 DIAGNOSIS — J9601 Acute respiratory failure with hypoxia: Secondary | ICD-10-CM | POA: Diagnosis not present

## 2021-08-10 DIAGNOSIS — R6520 Severe sepsis without septic shock: Secondary | ICD-10-CM | POA: Diagnosis not present

## 2021-08-10 LAB — BLOOD GAS, VENOUS
Acid-Base Excess: 5.8 mmol/L — ABNORMAL HIGH (ref 0.0–2.0)
Bicarbonate: 30.7 mmol/L — ABNORMAL HIGH (ref 20.0–28.0)
O2 Saturation: 47.6 %
Patient temperature: 37
pCO2, Ven: 52.5 mmHg (ref 44.0–60.0)
pH, Ven: 7.385 (ref 7.250–7.430)
pO2, Ven: <31 mmHg — CL (ref 32.0–45.0)

## 2021-08-10 LAB — CBC WITH DIFFERENTIAL/PLATELET
Abs Immature Granulocytes: 0.07 10*3/uL (ref 0.00–0.07)
Basophils Absolute: 0 10*3/uL (ref 0.0–0.1)
Basophils Relative: 0 %
Eosinophils Absolute: 0.3 10*3/uL (ref 0.0–0.5)
Eosinophils Relative: 3 %
HCT: 39 % (ref 39.0–52.0)
Hemoglobin: 12.4 g/dL — ABNORMAL LOW (ref 13.0–17.0)
Immature Granulocytes: 1 %
Lymphocytes Relative: 12 %
Lymphs Abs: 1.5 10*3/uL (ref 0.7–4.0)
MCH: 28.8 pg (ref 26.0–34.0)
MCHC: 31.8 g/dL (ref 30.0–36.0)
MCV: 90.7 fL (ref 80.0–100.0)
Monocytes Absolute: 0.9 10*3/uL (ref 0.1–1.0)
Monocytes Relative: 8 %
Neutro Abs: 9.3 10*3/uL — ABNORMAL HIGH (ref 1.7–7.7)
Neutrophils Relative %: 76 %
Platelets: 212 10*3/uL (ref 150–400)
RBC: 4.3 MIL/uL (ref 4.22–5.81)
RDW: 15.5 % (ref 11.5–15.5)
WBC: 12.1 10*3/uL — ABNORMAL HIGH (ref 4.0–10.5)
nRBC: 0 % (ref 0.0–0.2)

## 2021-08-10 LAB — PHOSPHORUS: Phosphorus: 3 mg/dL (ref 2.5–4.6)

## 2021-08-10 LAB — COMPREHENSIVE METABOLIC PANEL
ALT: 26 U/L (ref 0–44)
AST: 18 U/L (ref 15–41)
Albumin: 2.9 g/dL — ABNORMAL LOW (ref 3.5–5.0)
Alkaline Phosphatase: 55 U/L (ref 38–126)
Anion gap: 9 (ref 5–15)
BUN: 14 mg/dL (ref 8–23)
CO2: 29 mmol/L (ref 22–32)
Calcium: 8.4 mg/dL — ABNORMAL LOW (ref 8.9–10.3)
Chloride: 100 mmol/L (ref 98–111)
Creatinine, Ser: 0.9 mg/dL (ref 0.61–1.24)
GFR, Estimated: >60 mL/min (ref >60)
Glucose, Bld: 146 mg/dL — ABNORMAL HIGH (ref 70–99)
Potassium: 3.6 mmol/L (ref 3.5–5.1)
Sodium: 138 mmol/L (ref 135–145)
Total Bilirubin: 1 mg/dL (ref 0.3–1.2)
Total Protein: 6.6 g/dL (ref 6.5–8.1)

## 2021-08-10 LAB — ECHOCARDIOGRAM LIMITED
Height: 74 in
Weight: 7440 oz

## 2021-08-10 LAB — GLUCOSE, CAPILLARY
Glucose-Capillary: 198 mg/dL — ABNORMAL HIGH (ref 70–99)
Glucose-Capillary: 202 mg/dL — ABNORMAL HIGH (ref 70–99)
Glucose-Capillary: 155 mg/dL — ABNORMAL HIGH (ref 70–99)
Glucose-Capillary: 161 mg/dL — ABNORMAL HIGH (ref 70–99)

## 2021-08-10 LAB — MAGNESIUM: Magnesium: 2.1 mg/dL (ref 1.7–2.4)

## 2021-08-10 MED ORDER — POTASSIUM CHLORIDE CRYS ER 20 MEQ PO TBCR
40.0000 meq | EXTENDED_RELEASE_TABLET | Freq: Once | ORAL | Status: AC
  Administered 2021-08-10: 40 meq via ORAL
  Filled 2021-08-10: qty 2

## 2021-08-10 MED ORDER — LEVETIRACETAM 500 MG PO TABS
500.0000 mg | ORAL_TABLET | Freq: Two times a day (BID) | ORAL | Status: DC
  Administered 2021-08-10 – 2021-08-11 (×3): 500 mg via ORAL
  Filled 2021-08-10 (×3): qty 1

## 2021-08-10 NOTE — Plan of Care (Signed)
Neurology plan of care note:  Patient states he slept pretty well last night, better than the night before last. He has not had any jerking, twitching, or tremor of the right hand since being restarted on Keppra 500mg  po q12 hour. He is tolerating the medication just fine.   Per chart, his last out patient neurology visit was 04/22/21 to Dr. April Manson at [facility]. We will make out patient referral for appt.   Please call should he have any typical movements of complaints as he had before. Other than that, neuro will sign off.   Clance Boll, MSN, APN-BC Neurology Nurse Practitioner Pager [phone]

## 2021-08-10 NOTE — Progress Notes (Signed)
PROGRESS NOTE    Lawrence [name]  YBO:[id number] DOB: 06/14/1956 DOA: 08/07/2021 PCP: [name], PA-C   Brief Narrative:  The patient is a 66 year old morbidly obese Caucasian male with a past medical history significant for but limited to chronic diastolic CHF, atrial fibrillation, history of SVT, history of anemia, BPH, history of venous stasis, COPD, diabetes mellitus type 2, hypertension, OSA, partial seizures, PTSD as well as depression and other comorbidities who presented with ongoing altered mental status and fevers.  Due to his altered mental status history is obtained from the chart and family.  Patient's family called EMS yesterday due to his ongoing confusion and worsening weakness.  At baseline patient is alert and oriented and usually able to walk with a walker.  He was unable to do so yesterday and had an episode of incontinence because unable to get the strength to get out of the chair.  He was brought to the ED and was found to be febrile with a fever 102.4, heart rate in the 100s and respiratory rate in the 20s requiring 5 L to maintain saturation.  He had basic blood work done and lactic acid was initially elevated mildly at 2.2 and trended down to 1.4.  His BNP is normal and his respiratory influenza panel and COVID were negative.  Urinalysis was done and showed only small hemoglobin.  Urine culture and blood culture sent.  Chest x-ray showed bilateral lower airspace disease concerning for pneumonia versus edema.  He had stable cardiomegaly and he is initiated on cefepime and azithromycin in the ED and received a total 3 L of bolus fluids and is started on maintenance IV fluids.  He is also received acetaminophen.  He was admitted for severe sepsis in the setting of pneumonia with complications of acute respiratory failure with hypoxia.  This morning he had some chest discomfort and pressure and also had some right arm shakiness and became confused eyes responsive but then came  to.  Cardiology and neurology were both consulted for further evaluation.  Cardiology felt that his chest pain was to be pleuritic in nature and in the prodrome of seizure-like activity.  He had a brief episode of A. fib yesterday with RVR and they are recommending Eliquis for anticoagulation and continue Cardizem.  Given that his volume status is difficult exam he was given IV Lasix and his home torsemide has been resumed.  Echocardiogram is being repeated and pending read.  Neurology felt that he had seizure-like activity so they started him back on the [place].  Patient is improved and now off of oxygen.  Feels closer to his baseline.  WBC still remains a little bit elevated.  Assessment & Plan:   Principal Problem:   Severe sepsis ([place]) Active Problems:   OSA (obstructive sleep apnea)   PTSD (post-traumatic stress disorder)   BPH without obstruction/lower urinary tract symptoms   Acute respiratory failure with hypoxia (HCC)   Diabetes mellitus type 2 in obese (HCC)   Pure hypercholesterolemia   Chronic diastolic CHF (congestive heart failure) (HCC)   Do not resuscitate status   Diabetic neuropathy ([place])   Type 2 diabetes mellitus with diabetic polyneuropathy, with long-term current use of insulin (HCC)   Essential hypertension   Seizure (HCC)   AF (paroxysmal atrial fibrillation) (HCC)   COPD (chronic obstructive pulmonary disease) ([place])   Depression   Pneumonia    Severe sepsis in the setting of Pneumonia Acute respiratory failure with hypoxia -Patient presenting with fever and confusion.  Found to have leukocytosis to 25.6, fever to 102.4, heart rate in the 100s, respiratory rate in the 20s all meeting criteria for sepsis.  Meets criteria for severe sepsis with respiratory failure requiring 5 L to maintain saturations and desaturating on room air. -Urinalysis without any evidence of infection, chest x-ray with bilateral lower lobe airspace disease concerning for  pneumonia. -Pneumonia as a presumed source given chest x-ray finding and new respiratory failure.  Started on cefepime and azithromycin in the ED and will continue -Blood pressure currently stable.  Did receive 3 L bolus and does have a history of heart failure so we will monitor for volume overload closely. -Continue to monitor Monitor on progressive unit -Reduced IV fluid rate considering history of heart failure and now stopped -Continue with cefepime and azithromycin for now -Follow-up urine culture and blood culture -Procalcitonin was mildly elevated at 0.41 -Continue toTrend fever curve and white count -SpO2: 91 % O2 Flow Rate (L/min): 5 L/min; oxygen is now being weaned -Repeat CXR showed "Cardiomegaly with no acute intrathoracic process identified. Improved since previous study." -WBC 25.6 -> 23.5 and is now further improved to 11.7 and today it is 12.1 -Since procalcitonin level went from 0.41 and then trended down to 0.20 -Started on DuoNeb 3 mils every 6 scheduled and will add flutter valve, incentive spirometry -Continuous pulse oximetry maintain O2 saturation greater than 90% -Continue supplemental oxygen via nasal cannula and wean O2 as tolerated -We will need an ambulatory home O2 screen and repeat chest x-ray prior to discharge; repeat chest x-ray in the a.m. and wean O2 as tolerated   COPD -Respiratory failure secondary to sepsis versus concurrent COPD exacerbation. -Respiratory failure on 5 L as above and now starting to be weaned - Replace home Stiolto with formulary Brovana and Incruse -Continue with scheduled duo nebs - As needed albuterol; as above -Antibiotic coverage as above -Currently not wheezing   Chronic diastolic CHF Hypertension Atrial fibrillation with RVR > Diastolic heart failure.  Last echo in November with EF 60-65% and normal RV function. > Has received 3 L of IV fluids in the ED and will hold on further IV fluid hydration -Resume his home Lasix  80 mg p.o. twice daily but it appears he is no longer taking furosemide so we will continue his home torsemide 60 mg every morning and 40 mg every evening but since his volume status is difficult to assess on exam cardiology is going to check a limited echocardiogram and continue home diuretics; he did receive IV Lasix and then was transitioned back to his home torsemide at 60 mg in the morning and 40 mg in the evening -Some medications are not in agreement between his records from [facility].  We will continue medications that are on both lists and await pharmacy verification of other medications. - Continue with metoprolol, pravastatin -Continue with diuretics per cardiology -His spironolactone and Imdur and diltiazem have been resumed. - Continue with apixaban 5 mg p.o. twice daily hyperlipidemia -Plan is to follow-up in the outpatient setting with the heart failure clinic for right heart cath -Cardiology recommending spironolactone 25 g daily as well as Farxiga 10 mg daily -Cardiology is now assessing a limited echocardiogram to rule out pericardial effusion given his pleuritic symptoms  Chest pressure/chest pain, improved -Troponins x4 are flat and not elevated -Given nitroglycerin with improvement in his chest pressure -Became diaphoretic this morning and had some chest pressure and cardiology was consulted and they felt the  patient had some mixed symptoms and felt that his chest pain was more pleuritic given that he had exacerbation when he did breathe deeply. -He has had work-up for CAD in the past and he had mild nonobstructive CAD in 2019 and a stress test in April 2022 which was low risk -Cardiology felt that his symptoms were related to seizure-like activity with underlying issue but the goal is to get a limited echocardiogram to rule out pericardial effusion; echocardiogram is being repeated  Right arm jerking and unresponsiveness patient with a history of  seizures -Could be postictal and having seizures given that he has a history of seizures -Check EEG and consult neurology for further evaluation -EEG done was within normal limits with no seizures or epileptiform discharges seen throughout the recording -Neurology evaluated and placed him back on Keppra 500 g p.o. every 12   Diabetes mellitus type II - SSI -Hold his metformin 2000 mg p.o. daily, Farxiga 10 mg p.o. nightly as well as Trulicity 4.5 mg every Monday -CBGs ranging 155-202  Hypokalemia -Mild at 3.4 and improved to 3.6 -Replete with p.o. KCl 40 milliequivalents x1 -Mag level was 2.1 -Continue monitor and replete as necessary -Repeat CMP in a.m.  HLD - Continue home atorvastatin  Normocytic anemia Likely dilutional drop as he was hemoconcentrated on admission -Patient's hemoglobin/hematocrit went from 16.0/46.0 on admission is now 12.7/38.8 -> 12.4/39.0 -Check anemia panel in the a.m. -Continue to monitor for signs and symptoms of bleeding; currently no overt bleeding noted -Repeat CBC in a.m.  OSA - Continue CPAP nightly as needed  PTSD Depression > Continue with aripiprazole 5 mg p.o. daily, buspirone 5 mg p.o. 3 times daily and prazosin 2 mg p.o. nightly - C/w Trazodone and Temazepam 15 mg po qHS - Held off off on sertraline versus other antidepressants for now  GERD/GI prophylaxis - Continue PPI  Neuropathy - Continue Lyrica  Morbid Obesity -Complicates overall prognosis and care -Estimated body mass index is 59.7 kg/m as calculated from the following:   Height as of this encounter: 6\' 2"  (1.88 m).   Weight as of this encounter: 210.9 kg.  -Weight Loss and Dietary Counseling given  DVT prophylaxis: Anticoagulated with Apixaban Code Status: FULL CODE Family Communication: Discussed with family at bedside Disposition Plan: Pending further clinical improvement and weaning of oxygen.  He will need an amatory home O2 screen as well as evaluation by PT OT  and improvement in his pneumonia  Status is: Inpatient   The patient will require care spanning > 2 midnights and should be moved to inpatient because: He will need PT OT to further evaluate and be weaned off of supplemental oxygen    Consultants:  Cardiology Neurology  Procedures:  ECHOCARDIOGRAM, done and pending read  Antimicrobials:  Anti-infectives (From admission, onward)    Start     Dose/Rate Route Frequency Ordered Stop   08/08/21 0400  ceFEPIme (MAXIPIME) 2 g in sodium chloride 0.9 % 100 mL IVPB        2 g 200 mL/hr over 30 Minutes Intravenous Every 8 hours 08/07/21 2109     08/07/21 2115  azithromycin (ZITHROMAX) 500 mg in sodium chloride 0.9 % 250 mL IVPB        500 mg 250 mL/hr over 60 Minutes Intravenous Every 24 hours 08/07/21 2100 08/12/21 2159   08/07/21 1900  ceFEPIme (MAXIPIME) 2 g in sodium chloride 0.9 % 100 mL IVPB        2 g 200 mL/hr over  30 Minutes Intravenous  Once 08/07/21 1853 08/07/21 1949        Subjective: Seen and examined at bedside and he is feeling better.  Was weaned off of oxygen now and doing okay.  Denied any chest pain but states he did have a little bit discomfort.  No nausea or vomiting.  Feels closer to his baseline and his hand jerking has stopped.  No other concerns or points this time and feels a lot better today compared to yesterday.  Objective: Vitals:   08/10/21 0736 08/10/21 0755 08/10/21 1151 08/10/21 1620  BP: 112/62  116/72 116/71  Pulse: 89 93 93 84  Resp: 19  20 (!) 21  Temp: 98.1 F (36.7 C)  98.1 F (36.7 C) 98.3 F (36.8 C)  TempSrc: Oral  Oral Oral  SpO2: 93%  92% 91%  Weight:      Height:        Intake/Output Summary (Last 24 hours) at 08/10/2021 1714 Last data filed at 08/10/2021 1637 Gross per 24 hour  Intake 1060 ml  Output 5405 ml  Net -4345 ml    Filed Weights   08/08/21 0800 08/08/21 1841 08/09/21 0506  Weight: (!) 173.3 kg (!) 173.3 kg (!) 210.9 kg   Examination: Physical  Exam:  Constitutional: WN/WD morbidly obese Caucasian male currently in no acute distress and feels a lot better and was weaned off of supplemental oxygen. Eyes: Lids and conjunctivae normal, sclerae anicteric  ENMT: External Ears, Nose appear normal. Grossly normal hearing. Mucous membranes are moist.  Neck: Appears normal, supple, no cervical masses, normal ROM, no appreciable thyromegaly; difficult to assess JVD status given his body habitus Respiratory: Diminished to auscultation bilaterally with coarse breath sounds and some slight crackles and rhonchi.  No appreciable wheezing or rale. Normal respiratory effort and patient is not tachypenic. No accessory muscle use.  Not wearing supplemental oxygen nasal cannula Cardiovascular: RRR, no murmurs / rubs / gallops. S1 and S2 auscultated.  1+ lower extremity edema Abdomen: Soft, non-tender, distended secondary to body habitus. Bowel sounds positive.  GU: Deferred. Musculoskeletal: No clubbing / cyanosis of digits/nails. No joint deformity upper and lower extremities.  Skin: No rashes, lesions, ulcers on limited skin evaluation. No induration; Warm and dry.  Neurologic: CN 2-12 grossly intact with no focal deficits. Romberg sign cerebellar reflexes not assessed.  Psychiatric: Normal judgment and insight. Alert and oriented x 3. Normal mood and appropriate affect.   Data Reviewed: I have personally reviewed following labs and imaging studies  CBC: Recent Labs  Lab 08/07/21 1847 08/07/21 2003 08/08/21 0201 08/09/21 0006 08/10/21 0125  WBC 25.6*  --  23.5* 11.7* 12.1*  NEUTROABS 23.8*  --   --  9.1* 9.3*  HGB 14.9 16.0   15.6 14.1 12.7* 12.4*  HCT 46.4 47.0   46.0 43.4 38.8* 39.0  MCV 91.9  --  92.3 91.5 90.7  PLT 263  --  239 202 259    Basic Metabolic Panel: Recent Labs  Lab 08/07/21 1847 08/07/21 2003 08/08/21 0201 08/09/21 0006 08/10/21 0125  NA 135 135   136 136 137 138  K 4.4 4.2   4.3 3.9 3.4* 3.6  CL 97* 100 99 99 100   CO2 27  --  27 28 29   GLUCOSE 175* 167* 144* 186* 146*  BUN 21 24* 19 17 14   CREATININE 1.16 1.00 0.97 1.16 0.90  CALCIUM 9.5  --  8.9 8.4* 8.4*  MG  --   --   --  2.1 2.1  PHOS  --   --   --  3.3 3.0    GFR: Estimated Creatinine Clearance: 156.8 mL/min (by C-G formula based on SCr of 0.9 mg/dL). Liver Function Tests: Recent Labs  Lab 08/07/21 1847 08/08/21 0201 08/09/21 0006 08/10/21 0125  AST 23 19 18 18   ALT 26 25 22 26   ALKPHOS 85 74 59 55  BILITOT 1.0 0.9 0.7 1.0  PROT 7.8 7.0 6.2* 6.6  ALBUMIN 3.8 3.4* 3.0* 2.9*    No results for input(s): LIPASE, AMYLASE in the last 168 hours. No results for input(s): AMMONIA in the last 168 hours. Coagulation Profile: Recent Labs  Lab 08/07/21 1847 08/08/21 0201  INR 1.0 1.1    Cardiac Enzymes: No results for input(s): CKTOTAL, CKMB, CKMBINDEX, TROPONINI in the last 168 hours. BNP (last 3 results) No results for input(s): PROBNP in the last 8760 hours. HbA1C: No results for input(s): HGBA1C in the last 72 hours. CBG: Recent Labs  Lab 08/09/21 1701 08/09/21 2232 08/10/21 0741 08/10/21 1150 08/10/21 1618  GLUCAP 174* 177* 198* 202* 155*    Lipid Profile: No results for input(s): CHOL, HDL, LDLCALC, TRIG, CHOLHDL, LDLDIRECT in the last 72 hours. Thyroid Function Tests: No results for input(s): TSH, T4TOTAL, FREET4, T3FREE, THYROIDAB in the last 72 hours. Anemia Panel: No results for input(s): VITAMINB12, FOLATE, FERRITIN, TIBC, IRON, RETICCTPCT in the last 72 hours. Sepsis Labs: Recent Labs  Lab 08/07/21 1847 08/08/21 0119 08/08/21 0201 08/09/21 0006  PROCALCITON  --   --  0.41 0.20  LATICACIDVEN 2.2* 1.4  --   --      Recent Results (from the past 240 hour(s))  Blood Culture (routine x 2)     Status: None (Preliminary result)   Collection Time: 08/07/21  6:47 PM   Specimen: BLOOD  Result Value Ref Range Status   Specimen Description BLOOD LEFT ANTECUBITAL  Final   Special Requests   Final     BOTTLES DRAWN AEROBIC AND ANAEROBIC Blood Culture results may not be optimal due to an inadequate volume of blood received in culture bottles   Culture   Final    NO GROWTH 3 DAYS Performed at [facility] [address] [place], [place] [address]    Report Status PENDING  Incomplete  Blood Culture (routine x 2)     Status: None (Preliminary result)   Collection Time: 08/07/21  6:52 PM   Specimen: BLOOD RIGHT HAND  Result Value Ref Range Status   Specimen Description BLOOD RIGHT HAND  Final   Special Requests   Final    BOTTLES DRAWN AEROBIC AND ANAEROBIC Blood Culture adequate volume   Culture   Final    NO GROWTH 3 DAYS Performed at [facility], [place] [address], [place], [place] [address]    Report Status PENDING  Incomplete  Resp Panel by RT-PCR (Flu A&B, Covid) Nasopharyngeal Swab     Status: None   Collection Time: 08/07/21  7:50 PM   Specimen: Nasopharyngeal Swab; Nasopharyngeal(NP) swabs in vial transport medium  Result Value Ref Range Status   SARS Coronavirus 2 by RT PCR NEGATIVE NEGATIVE Final    Comment: (NOTE) SARS-CoV-2 target nucleic acids are NOT DETECTED.  The SARS-CoV-2 RNA is generally detectable in upper respiratory specimens during the acute phase of infection. The lowest concentration of SARS-CoV-2 viral copies this assay can detect is 138 copies/mL. A negative result does not preclude SARS-Cov-2 infection and should not be used as  the sole basis for treatment or other patient management decisions. A negative result may occur with  improper specimen collection/handling, submission of specimen other than nasopharyngeal swab, presence of viral mutation(s) within the areas targeted by this assay, and inadequate number of viral copies(<138 copies/mL). A negative result must be combined with clinical observations, patient history, and epidemiological information. The expected result is Negative.  Fact Sheet for Patients:   EntrepreneurPulse.com.au  Fact Sheet for Healthcare Providers:  IncredibleEmployment.be  This test is no t yet approved or cleared by the [place] FDA and  has been authorized for detection and/or diagnosis of SARS-CoV-2 by FDA under an Emergency Use Authorization (EUA). This EUA will remain  in effect (meaning this test can be used) for the duration of the COVID-19 declaration under Section 564(b)(1) of the Act, 21 U.S.C.section 360bbb-3(b)(1), unless the authorization is terminated  or revoked sooner.       Influenza A by PCR NEGATIVE NEGATIVE Final   Influenza B by PCR NEGATIVE NEGATIVE Final    Comment: (NOTE) The Xpert Xpress SARS-CoV-2/FLU/RSV plus assay is intended as an aid in the diagnosis of influenza from Nasopharyngeal swab specimens and should not be used as a sole basis for treatment. Nasal washings and aspirates are unacceptable for Xpert Xpress SARS-CoV-2/FLU/RSV testing.  Fact Sheet for Patients: EntrepreneurPulse.com.au  Fact Sheet for Healthcare Providers: IncredibleEmployment.be  This test is not yet approved or cleared by the [place] FDA and has been authorized for detection and/or diagnosis of SARS-CoV-2 by FDA under an Emergency Use Authorization (EUA). This EUA will remain in effect (meaning this test can be used) for the duration of the COVID-19 declaration under Section 564(b)(1) of the Act, 21 U.S.C. section 360bbb-3(b)(1), unless the authorization is terminated or revoked.  Performed at [facility], [place] [address], [place], [place] [address]   Urine Culture     Status: Abnormal   Collection Time: 08/07/21  9:00 PM   Specimen: Urine, Clean Catch  Result Value Ref Range Status   Specimen Description URINE, CLEAN CATCH  Final   Special Requests NONE  Final   Culture (A)  Final    <10,000 COLONIES/mL INSIGNIFICANT GROWTH Performed at [facility], [place] [address], [place], [place] [address]    Report Status 08/09/2021 FINAL  Final     RN Pressure Injury Documentation:     Estimated body mass index is 59.7 kg/m as calculated from the following:   Height as of this encounter: 6\' 2"  (1.88 m).   Weight as of this encounter: 210.9 kg.  Malnutrition Type:   Malnutrition Characteristics:   Nutrition Interventions:    Radiology Studies: DG CHEST PORT 1 VIEW  Result Date: 08/10/2021 CLINICAL DATA:  Shortness of breath EXAM: PORTABLE CHEST 1 VIEW COMPARISON:  Chest x-ray 08/09/2021 FINDINGS: Heart is enlarged. Mediastinum appears stable. Pulmonary vasculature within normal limits. Improved aeration of the lungs since previous study. No focal consolidation identified. No significant pleural effusion. No pneumothorax. IMPRESSION: Cardiomegaly with no acute intrathoracic process identified. Improved since previous study. Electronically Signed   By: Ofilia Neas M.D.   On: 08/10/2021 08:21   DG Chest Port 1 View  Result Date: 08/09/2021 CLINICAL DATA:  Sudden onset of shortness of breath. EXAM: PORTABLE CHEST 1 VIEW COMPARISON:  08/09/2021 at 0500 hours and 08/08/2021 FINDINGS: Heart size is stable. Persistent streaky linear densities in lower lungs. Left basilar densities have slightly increased from the recent comparison examination. Upper lungs remain clear. Negative  for a pneumothorax. Trachea is midline. IMPRESSION: Streaky linear densities at both lung bases. Slight progression at the left lung base. These lung densities are nonspecific but favor atelectasis. Electronically Signed   By: Markus Daft M.D.   On: 08/09/2021 10:47   DG CHEST PORT 1 VIEW  Result Date: 08/09/2021 CLINICAL DATA:  Shortness of breath. EXAM: PORTABLE CHEST 1 VIEW COMPARISON:  08/08/2021 FINDINGS: Stable cardiomediastinal contours. The lung volumes are low. Persistent subsegmental areas of atelectasis noted within both lung bases. The visualized osseous structures  are unremarkable. IMPRESSION: Low lung volumes with persistent bibasilar atelectasis. Electronically Signed   By: Kerby Moors M.D.   On: 08/09/2021 07:12   DG CHEST PORT 1 VIEW  Result Date: 08/09/2021 CLINICAL DATA:  Chest pain EXAM: PORTABLE CHEST 1 VIEW COMPARISON:  08/07/2021 FINDINGS: Mild parenchymal scarring at the right lung base is unchanged. Lungs are otherwise clear. No pneumothorax or pleural effusion. Mild cardiomegaly is stable. Pulmonary vascularity is normal. No acute bone abnormality. IMPRESSION: No active disease.  Stable cardiomegaly. Electronically Signed   By: Fidela Salisbury M.D.   On: 08/09/2021 00:30   EEG adult  Result Date: 08/09/2021 Lora Havens, MD     08/09/2021  2:34 PM Patient Name: SHONDALE QUINLEY MRN: [id number] Epilepsy Attending: Lora Havens Referring Physician/Provider: Kerney Elbe, DO Date: 08/09/2021 Duration: 21.45 mins Patient history: 66yo M with shaking of arm and alteration of awareness as well as chest discomfort. EEG to evaluate for seizure. Level of alertness: Awake AEDs during EEG study: None Technical aspects: This EEG study was done with scalp electrodes positioned according to the 10-20 International system of electrode placement. Electrical activity was acquired at a sampling rate of 500Hz  and reviewed with a high frequency filter of 70Hz  and a low frequency filter of 1Hz . EEG data were recorded continuously and digitally stored. Description: The posterior dominant rhythm consists of 8 Hz activity of moderate voltage (25-35 uV) seen predominantly in posterior head regions, symmetric and reactive to eye opening and eye closing. Hyperventilation and photic stimulation were not performed.   IMPRESSION: This study is within normal limits. No seizures or epileptiform discharges were seen throughout the recording. Priyanka Barbra Sarks    Scheduled Meds:  apixaban  5 mg Oral BID   arformoterol  15 mcg Nebulization BID   And   umeclidinium bromide   1 puff Inhalation Daily   ARIPiprazole  5 mg Oral Daily   atorvastatin  40 mg Oral QPM   busPIRone  5 mg Oral TID   dapagliflozin propanediol  10 mg Oral QAC breakfast   diltiazem  180 mg Oral Daily   fluticasone  1 spray Each Nare Daily   insulin aspart  0-20 Units Subcutaneous TID WC   isosorbide mononitrate  30 mg Oral Daily   levETIRAcetam  500 mg Oral BID   metoprolol succinate  50 mg Oral Daily   pantoprazole  40 mg Oral Daily   prazosin  2 mg Oral QHS   pregabalin  150 mg Oral TID   sodium chloride flush  3 mL Intravenous Q12H   spironolactone  25 mg Oral Daily   temazepam  15 mg Oral QHS   torsemide  40 mg Oral QPM   torsemide  60 mg Oral Daily   traZODone  200 mg Oral QHS   venlafaxine XR  225 mg Oral Q breakfast   Continuous Infusions:  azithromycin 500 mg (08/09/21 2235)   ceFEPime (MAXIPIME) IV 2  g (08/10/21 1210)    LOS: 2 days   Kerney Elbe, DO [facility] PAGER is on [place]  If 7PM-7AM, please contact night-coverage [email or web address]

## 2021-08-10 NOTE — Evaluation (Signed)
Physical Therapy Evaluation Patient Details Name: ATIF CHAPPLE MRN: [id number] DOB: 15-Jun-1956 Today's Date: 08/10/2021  History of Present Illness  66 y.o. male presenting to ED 12/31 with fever and AMS. Patient found to have severe sepsis, pneumonia and acute respiratory failure with hypoxia. EEG (-). PMHx significant for DMII, HTN, COPD, CVA, parietal seizure, PTSD, diastolic CHF, A-fib, depression, OSA and HLD.  Clinical Impression   Pt admitted secondary to problem above with deficits below. PTA patient was living with spouse in apartment with ramp to enter. He was modified independent with ambulation with bariatric rollator. Pt currently requires min guard assist for all mobility due to pt's perceived weakness and for safety. Currently requiring 6L of oxygen via portable tank for all activity with sats 90-93%.  Anticipate patient will benefit from PT to address problems listed below.Will continue to follow acutely to maximize functional mobility independence and safety.          Recommendations for follow up therapy are one component of a multi-disciplinary discharge planning process, led by the attending physician.  Recommendations may be updated based on patient status, additional functional criteria and insurance authorization.  Follow Up Recommendations Home health PT    Assistance Recommended at Discharge Intermittent Supervision/Assistance  Patient can return home with the following  Assistance with cooking/housework;Help with stairs or ramp for entrance    Equipment Recommendations None recommended by PT  Recommendations for Other Services       Functional Status Assessment Patient has had a recent decline in their functional status and demonstrates the ability to make significant improvements in function in a reasonable and predictable amount of time.     Precautions / Restrictions Precautions Precautions: Fall      Mobility  Bed Mobility Overal bed mobility:  Needs Assistance Bed Mobility: Supine to Sit     Supine to sit: Min guard;HOB elevated     General bed mobility comments: no assist with HOB elevated; slight incr effort    Transfers Overall transfer level: Needs assistance Equipment used: Rolling walker (2 wheels) Transfers: Sit to/from Stand Sit to Stand: Min guard           General transfer comment: Min guardfrom EOB to RW for safety (air mattress; pt prefers bil UEs on RW handles to push down and come to stand)    Ambulation/Gait Ambulation/Gait assistance: Min guard Gait Distance (Feet): 60 Feet (seated rest; 60) Assistive device: Rolling walker (2 wheels) (bari RW) Gait Pattern/deviations: Step-through pattern;Decreased stride length       General Gait Details: on 6L O2 with sats >=90% throughout. mild dyspnea (but pt continued to talk while walking)  Science writer    Modified Rankin (Stroke Patients Only)       Balance Overall balance assessment: Needs assistance Sitting-balance support: Single extremity supported;No upper extremity supported;Feet supported Sitting balance-Leahy Scale: Good     Standing balance support: Bilateral upper extremity supported;Reliant on assistive device for balance;During functional activity Standing balance-Leahy Scale: Fair                               Pertinent Vitals/Pain Pain Assessment: 0-10 Pain Score: 3  Pain Location: Bilateral feet (chronic neuropathy) Pain Descriptors / Indicators: Aching;Sore;Tingling Pain Intervention(s): Limited activity within patient's tolerance    Home Living Family/patient expects to be discharged to:: Private residence Living Arrangements: Spouse/significant other;Other (Comment) ([facility] aide  4x week for 4hrs) Available Help at Discharge: Family;Available 24 hours/day Type of Home: Apartment Home Access: Ramped entrance       Home Layout: One level Home Equipment: Other (comment);Hospital  bed;Rolling Walker (2 wheels);BSC/3in1;Electric scooter;Rollator (4 wheels) (Bari Rollator) Additional Comments: Active with HH PT PTA.    Prior Function Prior Level of Function : Independent/Modified Independent             Mobility Comments: Rollator for household mobility; electric scooter in community dwellings ADLs Comments: Reports some assist for ADLs from aide. Able to complete toileting with Mod I. Can make small meals for himself.     Hand Dominance   Dominant Hand: Right    Extremity/Trunk Assessment   Upper Extremity Assessment LUE Deficits / Details: Mild residual weakness 2/2 Hx of CVA    Lower Extremity Assessment Lower Extremity Assessment: Overall WFL for tasks assessed    Cervical / Trunk Assessment Cervical / Trunk Assessment: Other exceptions Cervical / Trunk Exceptions: Large body habitus  Communication   Communication: No difficulties  Cognition Arousal/Alertness: Awake/alert Behavior During Therapy: WFL for tasks assessed/performed Overall Cognitive Status: Within Functional Limits for tasks assessed                                          General Comments      Exercises     Assessment/Plan    PT Assessment Patient needs continued PT services  PT Problem List Decreased mobility;Decreased knowledge of use of DME;Decreased knowledge of precautions;Cardiopulmonary status limiting activity;Obesity       PT Treatment Interventions DME instruction;Gait training;Functional mobility training;Therapeutic activities;Therapeutic exercise;Balance training;Patient/family education    PT Goals (Current goals can be found in the Care Plan section)  Acute Rehab PT Goals Patient Stated Goal: be strong enough to go home and resume HHPT (working on balance and strength) PT Goal Formulation: With patient Time For Goal Achievement: 08/24/21 Potential to Achieve Goals: Good    Frequency Min 3X/week     Co-evaluation                AM-PAC PT "6 Clicks" Mobility  Outcome Measure Help needed turning from your back to your side while in a flat bed without using bedrails?: A Little Help needed moving from lying on your back to sitting on the side of a flat bed without using bedrails?: A Little Help needed moving to and from a bed to a chair (including a wheelchair)?: A Little Help needed standing up from a chair using your arms (e.g., wheelchair or bedside chair)?: A Little Help needed to walk in hospital room?: A Little Help needed climbing 3-5 steps with a railing? : Total 6 Click Score: 16    End of Session Equipment Utilized During Treatment: Oxygen Activity Tolerance: Patient tolerated treatment well Patient left: in bed;with call bell/phone within reach   PT Visit Diagnosis: Difficulty in walking, not elsewhere classified (R26.2)    Time: 2542-7062 PT Time Calculation (min) (ACUTE ONLY): 22 min   Charges:   PT Evaluation $PT Eval Moderate Complexity: 1 Mod           Arby Barrette, PT Acute Rehabilitation Services  Pager [phone] Office [phone]   Rexanne Mano 08/10/2021, 12:00 PM

## 2021-08-10 NOTE — Progress Notes (Signed)
Progress Note  Patient Name: Lawrence [name] Date of Encounter: 08/10/2021  [facility] Cardiologist: Fransico Him, MD   Subjective   Feeling well. No chest pain, sob or palpitations.  Slept well overnight.   Inpatient Medications    Scheduled Meds:  apixaban  5 mg Oral BID   arformoterol  15 mcg Nebulization BID   And   umeclidinium bromide  1 puff Inhalation Daily   ARIPiprazole  5 mg Oral Daily   atorvastatin  40 mg Oral QPM   busPIRone  5 mg Oral TID   dapagliflozin propanediol  10 mg Oral QAC breakfast   diltiazem  180 mg Oral Daily   fluticasone  1 spray Each Nare Daily   insulin aspart  0-20 Units Subcutaneous TID WC   isosorbide mononitrate  30 mg Oral Daily   levETIRAcetam  500 mg Oral BID   metoprolol succinate  50 mg Oral Daily   pantoprazole  40 mg Oral Daily   prazosin  2 mg Oral QHS   pregabalin  150 mg Oral TID   sodium chloride flush  3 mL Intravenous Q12H   spironolactone  25 mg Oral Daily   temazepam  15 mg Oral QHS   torsemide  40 mg Oral QPM   torsemide  60 mg Oral Daily   traZODone  200 mg Oral QHS   venlafaxine XR  225 mg Oral Q breakfast   Continuous Infusions:  azithromycin 500 mg (08/09/21 2235)   ceFEPime (MAXIPIME) IV 2 g (08/10/21 0427)   PRN Meds: acetaminophen **OR** acetaminophen, albuterol, diazepam, LORazepam, naLOXone (NARCAN)  injection, nitroGLYCERIN, oxyCODONE, polyethylene glycol   Vital Signs    Vitals:   08/09/21 2346 08/10/21 0430 08/10/21 0736 08/10/21 0755  BP: 120/73 123/73 112/62   Pulse: 96 90 89 93  Resp: 20 18 19    Temp: 98.1 F (36.7 C) 98 F (36.7 C) 98.1 F (36.7 C)   TempSrc: Oral Oral Oral   SpO2: 91% 92% 93%   Weight:      Height:        Intake/Output Summary (Last 24 hours) at 08/10/2021 1039 Last data filed at 08/10/2021 0943 Gross per 24 hour  Intake 420 ml  Output 4760 ml  Net -4340 ml   Last 3 Weights 08/10/2021 08/09/2021 08/08/2021  Weight (lbs) (No Data) 465 lb 382 lb 0.9 oz  Weight (kg)  (No Data) 210.923 kg 173.3 kg  Some encounter information is confidential and restricted. Go to Review Flowsheets activity to see all data.      Telemetry    NSR, brief afib episode yesterday  - Personally Reviewed  ECG    N/A  Physical Exam   GEN: Obese ill appearing male in no acute distress.   Neck: JVD difficult to assess  Cardiac: RRR, no murmurs, rubs, or gallops.  Respiratory: Clear to auscultation bilaterally. GI: Soft, nontender, non-distended  MS: No edema on LE but has skin breakdown; No deformity. Neuro:  Nonfocal  Psych: Normal affect   Labs    High Sensitivity Troponin:   Recent Labs  Lab 07/20/21 1450 08/09/21 0006 08/09/21 0610 08/09/21 1004 08/09/21 1216  TROPONINIHS 9 9 8 8 11      Chemistry Recent Labs  Lab 08/08/21 0201 08/09/21 0006 08/10/21 0125  NA 136 137 138  K 3.9 3.4* 3.6  CL 99 99 100  CO2 27 28 29   GLUCOSE 144* 186* 146*  BUN 19 17 14   CREATININE 0.97 1.16 0.90  CALCIUM 8.9 8.4* 8.4*  MG  --  2.1 2.1  PROT 7.0 6.2* 6.6  ALBUMIN 3.4* 3.0* 2.9*  AST 19 18 18   ALT 25 22 26   ALKPHOS 74 59 55  BILITOT 0.9 0.7 1.0  GFRNONAA >60 >60 >60  ANIONGAP 10 10 9     Lipids No results for input(s): CHOL, TRIG, HDL, LABVLDL, LDLCALC, CHOLHDL in the last 168 hours.  Hematology Recent Labs  Lab 08/08/21 0201 08/09/21 0006 08/10/21 0125  WBC 23.5* 11.7* 12.1*  RBC 4.70 4.24 4.30  HGB 14.1 12.7* 12.4*  HCT 43.4 38.8* 39.0  MCV 92.3 91.5 90.7  MCH 30.0 30.0 28.8  MCHC 32.5 32.7 31.8  RDW 15.6* 15.8* 15.5  PLT 239 202 212   Thyroid No results for input(s): TSH, FREET4 in the last 168 hours.  BNP Recent Labs  Lab 08/07/21 1847  BNP 46.5    DDimer No results for input(s): DDIMER in the last 168 hours.   Radiology    DG CHEST PORT 1 VIEW  Result Date: 08/10/2021 CLINICAL DATA:  Shortness of breath EXAM: PORTABLE CHEST 1 VIEW COMPARISON:  Chest x-ray 08/09/2021 FINDINGS: Heart is enlarged. Mediastinum appears stable. Pulmonary  vasculature within normal limits. Improved aeration of the lungs since previous study. No focal consolidation identified. No significant pleural effusion. No pneumothorax. IMPRESSION: Cardiomegaly with no acute intrathoracic process identified. Improved since previous study. Electronically Signed   By: Ofilia Neas M.D.   On: 08/10/2021 08:21   DG Chest Port 1 View  Result Date: 08/09/2021 CLINICAL DATA:  Sudden onset of shortness of breath. EXAM: PORTABLE CHEST 1 VIEW COMPARISON:  08/09/2021 at 0500 hours and 08/08/2021 FINDINGS: Heart size is stable. Persistent streaky linear densities in lower lungs. Left basilar densities have slightly increased from the recent comparison examination. Upper lungs remain clear. Negative for a pneumothorax. Trachea is midline. IMPRESSION: Streaky linear densities at both lung bases. Slight progression at the left lung base. These lung densities are nonspecific but favor atelectasis. Electronically Signed   By: Markus Daft M.D.   On: 08/09/2021 10:47   DG CHEST PORT 1 VIEW  Result Date: 08/09/2021 CLINICAL DATA:  Shortness of breath. EXAM: PORTABLE CHEST 1 VIEW COMPARISON:  08/08/2021 FINDINGS: Stable cardiomediastinal contours. The lung volumes are low. Persistent subsegmental areas of atelectasis noted within both lung bases. The visualized osseous structures are unremarkable. IMPRESSION: Low lung volumes with persistent bibasilar atelectasis. Electronically Signed   By: Kerby Moors M.D.   On: 08/09/2021 07:12   DG CHEST PORT 1 VIEW  Result Date: 08/09/2021 CLINICAL DATA:  Chest pain EXAM: PORTABLE CHEST 1 VIEW COMPARISON:  08/07/2021 FINDINGS: Mild parenchymal scarring at the right lung base is unchanged. Lungs are otherwise clear. No pneumothorax or pleural effusion. Mild cardiomegaly is stable. Pulmonary vascularity is normal. No acute bone abnormality. IMPRESSION: No active disease.  Stable cardiomegaly. Electronically Signed   By: Fidela Salisbury M.D.   On:  08/09/2021 00:30   EEG adult  Result Date: 08/09/2021 Lawrence Havens, MD     08/09/2021  2:34 PM Patient Name: Lawrence [name] MRN: [id number] Epilepsy Attending: Lora [name] Referring Physician/Provider: Kerney Elbe, DO Date: 08/09/2021 Duration: 21.45 mins Patient history: 66yo M with shaking of arm and alteration of awareness as well as chest discomfort. EEG to evaluate for seizure. Level of alertness: Awake AEDs during EEG study: None Technical aspects: This EEG study was done with scalp electrodes positioned according to the 10-20 International system of  electrode placement. Electrical activity was acquired at a sampling rate of 500Hz  and reviewed with a high frequency filter of 70Hz  and a low frequency filter of 1Hz . EEG data were recorded continuously and digitally stored. Description: The posterior dominant rhythm consists of 8 Hz activity of moderate voltage (25-35 uV) seen predominantly in posterior head regions, symmetric and reactive to eye opening and eye closing. Hyperventilation and photic stimulation were not performed.   IMPRESSION: This study is within normal limits. No seizures or epileptiform discharges were seen throughout the recording. Lawrence [name]    Cardiac Studies   Pending limited echo   Patient Profile     66 y.o. male  with a hx of mild non obstructive CAD, hypertension, hyperlipidemia, DM type II, COPD,  paroxysmal atrial fibrillation on Eliquis, chronic diastolic CHF, moderate aortic stenosis, seizure disorder, OSA on BiPAP, gout, and depression seen for chest pain.   Assessment & Plan    Chest pain  - Felt pleuritic in nature with prodrome of seizure like activity - No recurrent event overnight - He has mild nonobstructive CAD by cath in 2019.  Stress test 11/2020 was low risk. -Ruled out of ACS.  EKG without acute ischemic changes. Troponin is negative x3.  -Pending limited echocardiogram to rule out pericardial effusion given pleuritic  symptoms  2. AFib RVR - Has brief afib episode for 5 hours yesterday - Continue Eliquis for anticoagulation - Continue cardizem   3.   Chronic diastolic heart failure -Volume status difficult to evaluate by exam -Initially hydrated then given IV Lasix  -Continue home torsemide 60 mg a.m. and 40 mg p.m -Continue spironolactone and Farxiga -Follow up with Dr. Aundra Dubin as outpatient for [place] - Resume home supplemental potassium    For questions or updates, please contact [facility] Please consult [email or web address] for contact info under        SignedLeanor Kail, PA  08/10/2021, 10:39 AM

## 2021-08-10 NOTE — Progress Notes (Addendum)
°  Transition of Care [facility]) Screening Note   Patient Details  Name: Lawrence [name] Date of Birth: [birth year]   Transition of Care [facility]) CM/SW Contact:    Benard Halsted, [place] Phone Number: 08/10/2021, 8:33 AM   [facility] online notification completed: ID# [id number].  Transition of Care Department [facility]) has reviewed patient and no TOC needs have been identified at this time. We will continue to monitor patient advancement through interdisciplinary progression rounds. If new patient transition needs arise, please place a TOC consult.

## 2021-08-10 NOTE — Progress Notes (Signed)
Pt stated he will call if he needs help with the CPAP.

## 2021-08-10 NOTE — Progress Notes (Signed)
°  Echocardiogram 2D Echocardiogram has been performed.  Lawrence [name] F 08/10/2021, 3:36 PM

## 2021-08-10 NOTE — Evaluation (Signed)
Occupational Therapy Evaluation Patient Details Name: Lawrence [name] MRN: [id number] DOB: May 06, 1956 Today's Date: 08/10/2021   History of Present Illness 66 y.o. male presenting to ED 12/31 with fever and AMS. Patient found to have severe sepsis, pneumonia and acute respiratory failure with hypoxia. EEG (-). PMHx significant for DMII, HTN, COPD, CVA, parietal seizure, PTSD, diastolic CHF, A-fib, depression, OSA and HLD.   Clinical Impression   PTA patient was living with his spouse in a 1st floor apartment and was grossly Mod I with ADLs/IADLs with use of bari RW. Patient reports requiring assist for some LB ADLs. Patient has  a HHA 4 days/wk for 4hrs to assist with ADLs and light housekeeping. Patient currently functioning below baseline demonstrating observed ADLs with Min A overall. Patient also limited by deficits listed below including decreased cardiopulmonary status, decreased static/dynamic balance, decreased activity tolerance and generalized weakness and would benefit from continued acute OT services in prep for safe d/c home with spouse.       Recommendations for follow up therapy are one component of a multi-disciplinary discharge planning process, led by the attending physician.  Recommendations may be updated based on patient status, additional functional criteria and insurance authorization.   Follow Up Recommendations  Home health OT    Assistance Recommended at Discharge Frequent or constant Supervision/Assistance  Patient can return home with the following A little help with bathing/dressing/bathroom;A little help with walking and/or transfers;Assistance with cooking/housework    Functional Status Assessment  Patient has had a recent decline in their functional status and demonstrates the ability to make significant improvements in function in a reasonable and predictable amount of time.  Equipment Recommendations  None recommended by OT    Recommendations for Other  Services       Precautions / Restrictions Precautions Precautions: Fall Restrictions Weight Bearing Restrictions: No      Mobility Bed Mobility Overal bed mobility: Needs Assistance Bed Mobility: Supine to Sit     Supine to sit: Min assist     General bed mobility comments: Min A at trunk with HOB elevated. Requires increased time/effort.    Transfers Overall transfer level: Needs assistance Equipment used: Rolling walker (2 wheels) Transfers: Sit to/from Stand;Bed to chair/wheelchair/BSC Sit to Stand: Min assist Stand pivot transfers: Min guard         General transfer comment: Min guard to Min A for sit to stand from EOB to RW. Min gaurd for stand-pivot to recliner. Declined further mobility as standard walker is too narrow.      Balance Overall balance assessment: Needs assistance Sitting-balance support: Single extremity supported;No upper extremity supported;Feet supported Sitting balance-Leahy Scale: Good     Standing balance support: Bilateral upper extremity supported;Reliant on assistive device for balance;During functional activity Standing balance-Leahy Scale: Fair                             ADL either performed or assessed with clinical judgement   ADL Overall ADL's : Needs assistance/impaired Eating/Feeding: Independent   Grooming: Set up;Sitting   Upper Body Bathing: Set up;Sitting   Lower Body Bathing: Minimal assistance;Sit to/from stand   Upper Body Dressing : Set up;Sitting   Lower Body Dressing: Minimal assistance;Sit to/from stand   Toilet Transfer: Minimal assistance;Rolling walker (2 wheels)                   Vision Baseline Vision/History: 1 Wears glasses Ability to See in Adequate Light:  0 Adequate Patient Visual Report: No change from baseline Vision Assessment?: No apparent visual deficits     Perception     Praxis      Pertinent Vitals/Pain Pain Assessment: 0-10 Pain Score: 3  Pain Location:  Bilateral feet (chronic neuropathy) Pain Descriptors / Indicators: Aching;Sore;Tingling Pain Intervention(s): Limited activity within patient's tolerance;Monitored during session;Premedicated before session;Repositioned     Hand Dominance Right   Extremity/Trunk Assessment Upper Extremity Assessment Upper Extremity Assessment: LUE deficits/detail LUE Deficits / Details: Mild residual weakness 2/2 Hx of CVA   Lower Extremity Assessment Lower Extremity Assessment: Defer to PT evaluation   Cervical / Trunk Assessment Cervical / Trunk Assessment: Other exceptions Cervical / Trunk Exceptions: Large body habitus   Communication Communication Communication: No difficulties   Cognition Arousal/Alertness: Awake/alert Behavior During Therapy: WFL for tasks assessed/performed Overall Cognitive Status: Within Functional Limits for tasks assessed                                       General Comments  SpO2 95-93% on 5L O2 at rest. Decreased to 90% on 6L via portable tank with light activity.    Exercises     Shoulder Instructions      Home Living Family/patient expects to be discharged to:: Private residence Living Arrangements: Spouse/significant other;Other (Comment) ([facility] aide 4x week for 4hrs) Available Help at Discharge: Family;Available 24 hours/day Type of Home: Apartment Home Access: Level entry     Home Layout: One level     Bathroom Shower/Tub: Tub/shower unit;Sponge bathes at baseline   Bathroom Toilet: Handicapped height (Uses BSC at baseline; unable to fit inside of bathroom) Bathroom Accessibility: No   Home Equipment: Other (comment);Hospital bed;Rolling Walker (2 wheels);BSC/3in1;Electric scooter Judie Petit RW)   Additional Comments: Active with HH PT PTA.      Prior Functioning/Environment Prior Level of Function : Independent/Modified Independent             Mobility Comments: RW for household mobility; electric scooter in community  dwellings ADLs Comments: Reports some assist for ADLs from aide. Able to complete toileting with Mod I. Can make small meals for himself.        OT Problem List: Decreased strength;Decreased activity tolerance;Impaired balance (sitting and/or standing);Cardiopulmonary status limiting activity;Obesity;Pain      OT Treatment/Interventions: Self-care/ADL training;Therapeutic exercise;Energy conservation;DME and/or AE instruction;Therapeutic activities;Patient/family education;Balance training    OT Goals(Current goals can be found in the care plan section) Acute Rehab OT Goals Patient Stated Goal: To return home. OT Goal Formulation: With patient Time For Goal Achievement: 08/24/21 Potential to Achieve Goals: Good ADL Goals Pt Will Perform Grooming: with supervision;standing Pt Will Transfer to Toilet: with supervision;bedside commode Additional ADL Goal #1: Patient will tolerate 15 mintues of therapeutic activity without need for rest break indicating improved activity tolerance in prep for ADLs.  OT Frequency: Min 2X/week    Co-evaluation              AM-PAC OT "6 Clicks" Daily Activity     Outcome Measure Help from another person eating meals?: None Help from another person taking care of personal grooming?: A Little Help from another person toileting, which includes using toliet, bedpan, or urinal?: A Little Help from another person bathing (including washing, rinsing, drying)?: A Little Help from another person to put on and taking off regular upper body clothing?: A Little Help from another person to put on and  taking off regular lower body clothing?: A Little 6 Click Score: 19   End of Session Equipment Utilized During Treatment: Rolling walker (2 wheels);Oxygen Nurse Communication: Mobility status  Activity Tolerance: Patient tolerated treatment well Patient left: in chair;with call bell/phone within reach;with family/visitor present  OT Visit Diagnosis: Unsteadiness  on feet (R26.81);Muscle weakness (generalized) (M62.81);Pain Pain - Right/Left:  (bilateral) Pain - part of body: Ankle and joints of foot                Time: 4514-6047 OT Time Calculation (min): 22 min Charges:  OT General Charges $OT Visit: 1 Visit OT Evaluation $OT Eval Low Complexity: 1 Low  Deysi Soldo H. OTR/L Supplemental OT, Department of rehab services [phone]  Geralynn Capri R H. 08/10/2021, 8:53 AM

## 2021-08-11 ENCOUNTER — Inpatient Hospital Stay (HOSPITAL_COMMUNITY): Payer: No Typology Code available for payment source

## 2021-08-11 DIAGNOSIS — A419 Sepsis, unspecified organism: Secondary | ICD-10-CM | POA: Diagnosis not present

## 2021-08-11 DIAGNOSIS — R6520 Severe sepsis without septic shock: Secondary | ICD-10-CM | POA: Diagnosis not present

## 2021-08-11 LAB — COMPREHENSIVE METABOLIC PANEL
ALT: 34 U/L (ref 0–44)
AST: 22 U/L (ref 15–41)
Albumin: 3.1 g/dL — ABNORMAL LOW (ref 3.5–5.0)
Alkaline Phosphatase: 61 U/L (ref 38–126)
Anion gap: 11 (ref 5–15)
BUN: 17 mg/dL (ref 8–23)
CO2: 27 mmol/L (ref 22–32)
Calcium: 8.6 mg/dL — ABNORMAL LOW (ref 8.9–10.3)
Chloride: 99 mmol/L (ref 98–111)
Creatinine, Ser: 0.98 mg/dL (ref 0.61–1.24)
GFR, Estimated: >60 mL/min (ref >60)
Glucose, Bld: 159 mg/dL — ABNORMAL HIGH (ref 70–99)
Potassium: 3.4 mmol/L — ABNORMAL LOW (ref 3.5–5.1)
Sodium: 137 mmol/L (ref 135–145)
Total Bilirubin: 0.9 mg/dL (ref 0.3–1.2)
Total Protein: 6.7 g/dL (ref 6.5–8.1)

## 2021-08-11 LAB — IRON AND TIBC
Iron: 46 ug/dL (ref 45–182)
Saturation Ratios: 13 % — ABNORMAL LOW (ref 17.9–39.5)
TIBC: 354 ug/dL (ref 250–450)
UIBC: 308 ug/dL

## 2021-08-11 LAB — CBC WITH DIFFERENTIAL/PLATELET
Abs Immature Granulocytes: 0.07 10*3/uL (ref 0.00–0.07)
Basophils Absolute: 0.1 10*3/uL (ref 0.0–0.1)
Basophils Relative: 1 %
Eosinophils Absolute: 0.4 10*3/uL (ref 0.0–0.5)
Eosinophils Relative: 4 %
HCT: 38.8 % — ABNORMAL LOW (ref 39.0–52.0)
Hemoglobin: 12.5 g/dL — ABNORMAL LOW (ref 13.0–17.0)
Immature Granulocytes: 1 %
Lymphocytes Relative: 16 %
Lymphs Abs: 1.7 10*3/uL (ref 0.7–4.0)
MCH: 29.1 pg (ref 26.0–34.0)
MCHC: 32.2 g/dL (ref 30.0–36.0)
MCV: 90.4 fL (ref 80.0–100.0)
Monocytes Absolute: 0.8 10*3/uL (ref 0.1–1.0)
Monocytes Relative: 8 %
Neutro Abs: 7.1 10*3/uL (ref 1.7–7.7)
Neutrophils Relative %: 70 %
Platelets: 250 10*3/uL (ref 150–400)
RBC: 4.29 MIL/uL (ref 4.22–5.81)
RDW: 15.3 % (ref 11.5–15.5)
WBC: 10.1 10*3/uL (ref 4.0–10.5)
nRBC: 0 % (ref 0.0–0.2)

## 2021-08-11 LAB — RETICULOCYTES
Immature Retic Fract: 19 % — ABNORMAL HIGH (ref 2.3–15.9)
RBC.: 4.25 MIL/uL (ref 4.22–5.81)
Retic Count, Absolute: 71.8 10*3/uL (ref 19.0–186.0)
Retic Ct Pct: 1.7 % (ref 0.4–3.1)

## 2021-08-11 LAB — GLUCOSE, CAPILLARY
Glucose-Capillary: 182 mg/dL — ABNORMAL HIGH (ref 70–99)
Glucose-Capillary: 173 mg/dL — ABNORMAL HIGH (ref 70–99)
Glucose-Capillary: 158 mg/dL — ABNORMAL HIGH (ref 70–99)
Glucose-Capillary: 181 mg/dL — ABNORMAL HIGH (ref 70–99)

## 2021-08-11 LAB — MAGNESIUM: Magnesium: 2.2 mg/dL (ref 1.7–2.4)

## 2021-08-11 LAB — FERRITIN: Ferritin: 226 ng/mL (ref 24–336)

## 2021-08-11 LAB — FOLATE: Folate: 11.1 ng/mL (ref >5.9)

## 2021-08-11 LAB — PHOSPHORUS: Phosphorus: 4.2 mg/dL (ref 2.5–4.6)

## 2021-08-11 LAB — VITAMIN B12: Vitamin B-12: 160 pg/mL — ABNORMAL LOW (ref 180–914)

## 2021-08-11 MED ORDER — SODIUM CHLORIDE 0.9 % IV SOLN
3500.0000 mg | Freq: Once | INTRAVENOUS | Status: AC
  Administered 2021-08-11: 3500 mg via INTRAVENOUS
  Filled 2021-08-11: qty 35

## 2021-08-11 MED ORDER — OXYCODONE HCL 5 MG PO TABS
5.0000 mg | ORAL_TABLET | Freq: Three times a day (TID) | ORAL | Status: DC | PRN
Start: 2021-08-11 — End: 2021-08-12
  Administered 2021-08-11 – 2021-08-12 (×3): 5 mg via ORAL
  Filled 2021-08-11 (×3): qty 1

## 2021-08-11 MED ORDER — POTASSIUM CHLORIDE CRYS ER 20 MEQ PO TBCR
40.0000 meq | EXTENDED_RELEASE_TABLET | Freq: Two times a day (BID) | ORAL | Status: AC
  Administered 2021-08-11 (×2): 40 meq via ORAL
  Filled 2021-08-11 (×2): qty 2

## 2021-08-11 MED ORDER — TEMAZEPAM 7.5 MG PO CAPS
7.5000 mg | ORAL_CAPSULE | Freq: Every evening | ORAL | Status: DC | PRN
  Administered 2021-08-11: 7.5 mg via ORAL
  Filled 2021-08-11: qty 1

## 2021-08-11 MED ORDER — CYANOCOBALAMIN 1000 MCG/ML IJ SOLN
1000.0000 ug | Freq: Every day | INTRAMUSCULAR | Status: DC
  Administered 2021-08-11 – 2021-08-12 (×2): 1000 ug via SUBCUTANEOUS
  Filled 2021-08-11 (×2): qty 1

## 2021-08-11 MED ORDER — POTASSIUM CHLORIDE CRYS ER 20 MEQ PO TBCR
40.0000 meq | EXTENDED_RELEASE_TABLET | Freq: Once | ORAL | Status: DC
Start: 2021-08-11 — End: 2021-08-11

## 2021-08-11 MED ORDER — LORAZEPAM 2 MG/ML IJ SOLN
2.0000 mg | Freq: Two times a day (BID) | INTRAMUSCULAR | Status: DC | PRN
  Filled 2021-08-11: qty 1

## 2021-08-11 MED ORDER — PREGABALIN 100 MG PO CAPS
100.0000 mg | ORAL_CAPSULE | Freq: Every day | ORAL | Status: DC
  Administered 2021-08-12: 100 mg via ORAL
  Filled 2021-08-11: qty 1

## 2021-08-11 MED ORDER — VITAMIN B-12 1000 MCG PO TABS: 1000.0000 ug | ORAL_TABLET | Freq: Every day | ORAL | Status: DC

## 2021-08-11 MED ORDER — LEVETIRACETAM 500 MG PO TABS
1000.0000 mg | ORAL_TABLET | Freq: Two times a day (BID) | ORAL | Status: DC
  Administered 2021-08-11 – 2021-08-12 (×2): 1000 mg via ORAL
  Filled 2021-08-11 (×2): qty 2

## 2021-08-11 MED ORDER — POTASSIUM CHLORIDE CRYS ER 20 MEQ PO TBCR
40.0000 meq | EXTENDED_RELEASE_TABLET | Freq: Two times a day (BID) | ORAL | Status: DC
Start: 2021-08-11 — End: 2021-08-11

## 2021-08-11 MED ORDER — SODIUM CHLORIDE 0.9 % IV SOLN
2.0000 g | INTRAVENOUS | Status: DC
  Administered 2021-08-11 – 2021-08-12 (×2): 2 g via INTRAVENOUS
  Filled 2021-08-11 (×2): qty 20

## 2021-08-11 NOTE — Progress Notes (Signed)
Patient places self on CPAP machine.  Patient instructed to have RN call RT if assistance is needed.

## 2021-08-11 NOTE — Progress Notes (Signed)
.  SATURATION QUALIFICATIONS: (This note is used to comply with regulatory documentation for home oxygen)  Patient Saturations on Room Air at Rest = 91%  Patient Saturations on Room Air while Ambulating = 91%

## 2021-08-11 NOTE — TOC Initial Note (Addendum)
Transition of Care [facility]) - Initial/Assessment Note    Patient Details  Name: Lawrence [name] MRN: [id number] Date of Birth: 08/22/1955  Transition of Care [facility]) CM/SW Contact:    Cyndi Bender, RN Phone Number: 08/11/2021, 2:59 PM  Clinical Narrative:                  Spoke to patient regarding transition needs. He states he lives with his significant other. He has hospital bed, rolator, [facility], scooter and ramp. His significant other takes him to appointments. He has used [facility] for Home health in the past and would like to use them again. Spoke to [place] with [facility] and referral accepted. Ambulatory sat done and at this time he doesn't qualify for home 02. TOC will continue to follow.  Expected Discharge Plan: [place] Barriers to Discharge: Continued Medical Work up   Patient Goals and CMS Choice Patient states their goals for this hospitalization and ongoing recovery are:: return home      Expected Discharge Plan and Services Expected Discharge Plan: [place] Acute Care Choice: Durable Medical Equipment, Home Health Living arrangements for the past 2 months: Apartment                                      Prior Living Arrangements/Services Living arrangements for the past 2 months: Apartment Lives with:: Significant Other Patient language and need for interpreter reviewed:: Yes Do you feel safe going back to the place where you live?: Yes      Need for Family Participation in Patient Care: Yes (Comment) Care giver support system in place?: Yes (comment) Current home services:  (rolator, BSC, scooter, ramp, hospital bed) Criminal Activity/Legal Involvement Pertinent to Current Situation/Hospitalization: No - Comment as needed  Activities of Daily Living Home Assistive Devices/Equipment: None ADL Screening (condition at time of admission) Patient's cognitive ability adequate to safely complete daily  activities?: Yes Is the patient deaf or have difficulty hearing?: No Does the patient have difficulty seeing, even when wearing glasses/contacts?: No Does the patient have difficulty concentrating, remembering, or making decisions?: No Patient able to express need for assistance with ADLs?: Yes Does the patient have difficulty dressing or bathing?: Yes Independently performs ADLs?: No Communication: Independent Dressing (OT): Needs assistance Is this a change from baseline?: Pre-admission baseline Grooming: Needs assistance Feeding: Independent Bathing: Needs assistance Toileting: Independent In/Out Bed: Needs assistance Walks in Home: Independent Does the patient have difficulty walking or climbing stairs?: Yes Weakness of Legs: Both Weakness of Arms/Hands: None  Permission Sought/Granted Permission sought to share information with : Investment banker, corporate granted to share info w AGENCY: home health        Emotional Assessment Appearance:: Appears older than stated age Attitude/Demeanor/Rapport: Engaged Affect (typically observed): Accepting Orientation: : Oriented to Self, Oriented to Place, Oriented to  Time, Oriented to Situation Alcohol / Substance Use: Not Applicable Psych Involvement: No (comment)  Admission diagnosis:  SOB (shortness of breath) [R06.02] Severe sepsis ([place]) [A41.9, R65.20] Sepsis with encephalopathy without septic shock, due to unspecified organism ([place]) [A41.9, R65.20, G93.40] Patient Active Problem List   Diagnosis Date Noted   Depression 08/07/2021   Severe sepsis ([place]) 08/07/2021   Pneumonia 08/07/2021   Paronychia of great toe, left 07/07/2021   Stroke-like symptoms 04/27/2021   Ambulatory dysfunction  04/27/2021   Left-sided weakness 02/09/2021   Atrial fibrillation with rapid ventricular response ([place]) 02/08/2021   Acute left-sided weakness 01/25/2021   AF (paroxysmal atrial fibrillation) ([place]) 01/25/2021    COPD (chronic obstructive pulmonary disease) ([place]) 01/25/2021   Moderate aortic stenosis 01/21/2021   Acquired dilation of ascending aorta and aortic root ([place]) 01/21/2021   Coronary artery disease due to lipid rich plaque    SVT (supraventricular tachycardia) ([place])    Seizure ([place])    Essential hypertension 10/17/2020   Hypokalemia 10/17/2020   Cellulitis of right leg 12/04/2019   Pain due to onychomycosis of toenails of both feet 09/11/2019   Diabetic neuropathy ([place]) 09/11/2019   Type 2 diabetes mellitus with diabetic polyneuropathy, with long-term current use of insulin ([place]) 09/11/2019   Sinus tachycardia 03/07/2019   Chronic venous stasis 03/07/2019   History of anemia 12/26/2018   Anemia 08/24/2018   History of colon polyps 08/24/2018   Do not resuscitate status 08/24/2018   Chronic diastolic CHF (congestive heart failure) ([place]) 08/18/2018   Bilateral lower leg cellulitis    Coronary artery calcification seen on CAT scan    Pure hypercholesterolemia    Chest pain    Shortness of breath    Acute respiratory failure with hypoxia ([place]) 01/13/2018   Right foot pain 01/13/2018   Acute on chronic diastolic CHF (congestive heart failure) ([place]) 01/13/2018   Diabetes mellitus type 2 in obese ([place]) 01/13/2018   Lower extremity cellulitis 11/07/2017   Primary osteoarthritis, left shoulder 03/05/2017   BPH without obstruction/lower urinary tract symptoms 02/22/2017   Hematuria 02/22/2017   Peripheral neuropathy 02/22/2017   Physical deconditioning 02/22/2017   Morbid obesity ([place])    PTSD (post-traumatic stress disorder)    Anasarca 01/31/2017   Bilateral lower extremity edema 01/31/2017   OSA (obstructive sleep apnea)    PCP:  [name], PA-C Pharmacy:   [facility] - [place], [place] - [address] [place] [address] Phone: [phone] Fax: [phone]     Social Determinants of Health (SDOH)  Interventions    Readmission Risk Interventions Readmission Risk Prevention Plan 02/11/2021 12/06/2019  Transportation Screening Complete Complete  PCP or Specialist Appt within 5-7 Days - Complete  Home Care Screening - Complete  Medication Review (RN CM) - Complete  Medication Review (RN Care Manager) Complete -  [place] or Home Care Consult Complete -  SW Recovery Care/Counseling Consult Complete -  Palliative Care Screening Not Applicable -  [place] Not Applicable -  Some recent data might be hidden

## 2021-08-11 NOTE — Progress Notes (Signed)
PROGRESS NOTE    ARVO EALY  OJJ:[id number] DOB: 07/05/1956 DOA: 08/07/2021 PCP: [name], PA-C   Brief Narrative:    66 year old morbidly obese Caucasian male with a past medical history significant for but limited to chronic diastolic CHF, atrial fibrillation, history of SVT, history of anemia, BPH, history of venous stasis, COPD, diabetes mellitus type 2, hypertension, OSA, partial seizures, PTSD as well as depression and other comorbidities who presented with ongoing altered mental status and fevers, found to have pneumonia with hypoxic respiratory failure, also on multiple sedating medications.  During his admission there was a question of breakthrough seizure as well.     Subjective:  Patient in bed, appears comfortable, denies any headache, no fever, no chest pain or pressure, no shortness of breath , no abdominal pain. No new focal weakness.    Assessment & Plan:  Severe sepsis in the setting of Pneumonia & acute hypoxic respiratory failure. he has been treated with IV antibiotics, supplemental oxygen and supportive care with nebulizer treatments.  Encouraged to sit up in chair use I-S and flutter valve for pulmonary toiletry.  Advance activity.  Overall much improved.  Finish antibiotic course.  Toxic and metabolic encephalopathy.  Due to #1 above along with multiple sedative medications.  Medications adjusted and reduced to minimize sedation, counseled to sit up in chair in the daytime and increase activity. No headache or focal deficits.   Acute on chronic diastolic CHF - EF 93%, has fluid overload, continue diuretic regimen.  Hypertension - stable on beta-blocker.  Paroxysmal atrial fibrillation with RVR [place] vas 2 score of greater than 4.  Seen by cardiology, currently on beta-blocker and Eliquis, echo noted with preserved EF and no acute findings.  Continue rate control.  Chest pressure/chest pain, improved - Troponin x 4 flat and not elevated, symptoms resolved  after nitroglycerin, seen by cardiology, stable echo with preserved EF of 65% and no regional wall motion abnormalities or acute findings, continue beta-blocker statin and Eliquis for secondary prevention outpatient cardiology follow-up post discharge.  Right arm jerking and unresponsiveness patient with a history of seizures - Could be postictal and having seizures given that he has a history of seizures, seen by neurology stable EEG.  On Keppra 500 twice daily and stable now.  As needed Ativan on board   Hypokalemia - replaced.  HLD - Continue home atorvastatin  Normocytic anemia - Likely dilutional drop as he was hemoconcentrated on admission, no acute issues.  To be monitored outpatient by PCP.   OSA - Continue CPAP nightly as needed  PTSD -  Depression > Continue with aripiprazole 5 mg p.o. daily, buspirone 5 mg p.o. 3 times daily and prazosin 2 mg p.o. nightly,  C/w Trazodone and Temazepam nightly dose reduced.   GERD/GI prophylaxis - Continue PPI  Neuropathy - Continue Lyrica, dose reduced to minimize sedation  Morbid Obesity.  BMI of close to 60.  Follow with PCP for weight loss.  Diabetes mellitus type II -  SSI  Lab Results  Component Value Date   HGBA1C 7.1 (A) 07/15/2021    CBG (last 3)  Recent Labs    08/10/21 2017 08/11/21 0742 08/11/21 1111  GLUCAP 161* 182* 173*       DVT prophylaxis: Anticoagulated with Apixaban Code Status: FULL CODE Family Communication: Discussed with family at bedside Disposition Plan: Pending further clinical improvement and weaning of oxygen.  He will need an amatory home O2 screen as well as evaluation by PT OT and improvement  in his pneumonia  Status is: Inpatient   The patient will require care spanning > 2 midnights and should be moved to inpatient because: He will need PT OT to further evaluate and be weaned off of supplemental oxygen    Consultants:   Cardiology Neurology     Antimicrobials:  Anti-infectives (From  admission, onward)    Start     Dose/Rate Route Frequency Ordered Stop   08/11/21 1200  cefTRIAXone (ROCEPHIN) 2 g in sodium chloride 0.9 % 100 mL IVPB        2 g 200 mL/hr over 30 Minutes Intravenous Every 24 hours 08/11/21 0829 08/14/21 1159   08/08/21 0400  ceFEPIme (MAXIPIME) 2 g in sodium chloride 0.9 % 100 mL IVPB  Status:  Discontinued        2 g 200 mL/hr over 30 Minutes Intravenous Every 8 hours 08/07/21 2109 08/11/21 0829   08/07/21 2115  azithromycin (ZITHROMAX) 500 mg in sodium chloride 0.9 % 250 mL IVPB        500 mg 250 mL/hr over 60 Minutes Intravenous Every 24 hours 08/07/21 2100 08/12/21 2159   08/07/21 1900  ceFEPIme (MAXIPIME) 2 g in sodium chloride 0.9 % 100 mL IVPB        2 g 200 mL/hr over 30 Minutes Intravenous  Once 08/07/21 1853 08/07/21 1949         Examination:  Somnolent but easily arousable, No new F.N deficits,   [place].AT,PERRAL Supple Neck, No JVD,   Symmetrical Chest wall movement, Good air movement bilaterally, CTAB RRR,No Gallops, Rubs or new Murmurs,  +ve B.Sounds, Abd Soft, No tenderness,   No Cyanosis, 2+ leg edema   Data Reviewed: I have personally reviewed following labs and imaging studies  Recent Labs  Lab 08/07/21 1847 08/07/21 2003 08/08/21 0201 08/09/21 0006 08/10/21 0125 08/11/21 0106  WBC 25.6*  --  23.5* 11.7* 12.1* 10.1  HGB 14.9 16.0   15.6 14.1 12.7* 12.4* 12.5*  HCT 46.4 47.0   46.0 43.4 38.8* 39.0 38.8*  PLT 263  --  239 202 212 250  MCV 91.9  --  92.3 91.5 90.7 90.4  MCH 29.5  --  30.0 30.0 28.8 29.1  MCHC 32.1  --  32.5 32.7 31.8 32.2  RDW 15.5  --  15.6* 15.8* 15.5 15.3  LYMPHSABS 0.7  --   --  1.4 1.5 1.7  MONOABS 0.8  --   --  0.9 0.9 0.8  EOSABS 0.1  --   --  0.2 0.3 0.4  BASOSABS 0.1  --   --  0.0 0.0 0.1    Recent Labs  Lab 08/07/21 1847 08/07/21 2003 08/08/21 0119 08/08/21 0201 08/09/21 0006 08/10/21 0125 08/11/21 0106  NA 135 135   136  --  136 137 138 137  K 4.4 4.2   4.3  --  3.9 3.4* 3.6  3.4*  CL 97* 100  --  99 99 100 99  CO2 27  --   --  27 28 29 27   GLUCOSE 175* 167*  --  144* 186* 146* 159*  BUN 21 24*  --  19 17 14 17   CREATININE 1.16 1.00  --  0.97 1.16 0.90 0.98  CALCIUM 9.5  --   --  8.9 8.4* 8.4* 8.6*  AST 23  --   --  19 18 18 22   ALT 26  --   --  25 22 26  34  ALKPHOS 85  --   --  74 59 55 61  BILITOT 1.0  --   --  0.9 0.7 1.0 0.9  ALBUMIN 3.8  --   --  3.4* 3.0* 2.9* 3.1*  MG  --   --   --   --  2.1 2.1 2.2  PROCALCITON  --   --   --  0.41 0.20  --   --   LATICACIDVEN 2.2*  --  1.4  --   --   --   --   INR 1.0  --   --  1.1  --   --   --   BNP 46.5  --   --   --   --   --   --          Radiology Studies: DG CHEST PORT 1 VIEW  Result Date: 08/11/2021 CLINICAL DATA:  66 year old male with shortness of breath. EXAM: PORTABLE CHEST 1 VIEW COMPARISON:  Portable chest 08/10/2021 and earlier. FINDINGS: Portable AP upright view at 0617 hours. Stable lung volumes and mediastinal contours over this series of exams. Chronically somewhat low lung volumes. Visualized tracheal air column is within normal limits. Allowing for portable technique the lungs are clear. No acute osseous abnormality identified. IMPRESSION: No acute cardiopulmonary abnormality. Electronically Signed   By: Genevie Ann M.D.   On: 08/11/2021 08:18   DG CHEST PORT 1 VIEW  Result Date: 08/10/2021 CLINICAL DATA:  Shortness of breath EXAM: PORTABLE CHEST 1 VIEW COMPARISON:  Chest x-ray 08/09/2021 FINDINGS: Heart is enlarged. Mediastinum appears stable. Pulmonary vasculature within normal limits. Improved aeration of the lungs since previous study. No focal consolidation identified. No significant pleural effusion. No pneumothorax. IMPRESSION: Cardiomegaly with no acute intrathoracic process identified. Improved since previous study. Electronically Signed   By: Ofilia Neas M.D.   On: 08/10/2021 08:21   EEG adult  Result Date: 08/09/2021 Lora Havens, MD     08/09/2021  2:34 PM Patient Name: Lawrence [name] MRN: [id number] Epilepsy Attending: Lora Havens Referring Physician/Provider: Kerney Elbe, DO Date: 08/09/2021 Duration: 21.45 mins Patient history: 66yo M with shaking of arm and alteration of awareness as well as chest discomfort. EEG to evaluate for seizure. Level of alertness: Awake AEDs during EEG study: None Technical aspects: This EEG study was done with scalp electrodes positioned according to the 10-20 International system of electrode placement. Electrical activity was acquired at a sampling rate of 500Hz  and reviewed with a high frequency filter of 70Hz  and a low frequency filter of 1Hz . EEG data were recorded continuously and digitally stored. Description: The posterior dominant rhythm consists of 8 Hz activity of moderate voltage (25-35 uV) seen predominantly in posterior head regions, symmetric and reactive to eye opening and eye closing. Hyperventilation and photic stimulation were not performed.   IMPRESSION: This study is within normal limits. No seizures or epileptiform discharges were seen throughout the recording. Lora Havens   ECHOCARDIOGRAM LIMITED  Result Date: 08/10/2021    ECHOCARDIOGRAM LIMITED REPORT   Patient Name:   SLADEN PLANCARTE Date of Exam: 08/10/2021 Medical Rec #:  [id number]        Height:       74.0 in Accession #:    [id number]       Weight:       382.0 lb Date of Birth:  July 15, 1956        BSA:          2.861 m Patient Age:    48 years  BP:           116/72 mmHg Patient Gender: M                HR:           84 bpm. Exam Location:  Inpatient Procedure: Limited Echo Indications:    Chest pain  History:        Patient has prior history of Echocardiogram examinations, most                 recent 06/08/2021. CHF, COPD, Signs/Symptoms:Shortness of Breath;                 Risk Factors:Hypertension and Morbid obesity. Volume overload.  Sonographer:    Merrie Roof RDCS Referring Phys: [id number] [place]  1. Limited echo  2. Left  ventricular ejection fraction, by estimation, is 60 to 65%. The left ventricle has normal function. The left ventricle has no regional wall motion abnormalities. Left ventricular diastolic parameters are indeterminate.  3. Right ventricular systolic function was not well visualized. The right ventricular size is not well visualized. Tricuspid regurgitation signal is inadequate for assessing PA pressure.  4. The mitral valve is normal in structure. No evidence of mitral stenosis.  5. The aortic valve was not well visualized. Unable to determine aortic valve morphology due to image quality.  6. The inferior vena cava is dilated in size with >50% respiratory variability, suggesting right atrial pressure of 8 mmHg. FINDINGS  Left Ventricle: Left ventricular ejection fraction, by estimation, is 60 to 65%. The left ventricle has normal function. The left ventricle has no regional wall motion abnormalities. The left ventricular internal cavity size was normal in size. There is  no left ventricular hypertrophy. Left ventricular diastolic parameters are indeterminate. Right Ventricle: The right ventricular size is not well visualized. Right vetricular wall thickness was not assessed. Right ventricular systolic function was not well visualized. Tricuspid regurgitation signal is inadequate for assessing PA pressure. Left Atrium: Left atrial size was normal in size. Right Atrium: Right atrial size was normal in size. Pericardium: There is no evidence of pericardial effusion. Presence of epicardial fat layer. Mitral Valve: The mitral valve is normal in structure. No evidence of mitral valve stenosis. Tricuspid Valve: The tricuspid valve is normal in structure. No evidence of tricuspid stenosis. Aortic Valve: The aortic valve was not well visualized. Pulmonic Valve: The pulmonic valve was not well visualized. Aorta: The aortic root is normal in size and structure. Venous: The inferior vena cava is dilated in size with greater  than 50% respiratory variability, suggesting right atrial pressure of 8 mmHg. IAS/Shunts: No atrial level shunt detected by color flow Doppler. IVC IVC diam: 2.70 cm Kardie Tobb DO Electronically signed by Berniece Salines DO Signature Date/Time: 08/10/2021/6:15:32 PM    Final     Scheduled Meds:  apixaban  5 mg Oral BID   arformoterol  15 mcg Nebulization BID   And   umeclidinium bromide  1 puff Inhalation Daily   ARIPiprazole  5 mg Oral Daily   atorvastatin  40 mg Oral QPM   busPIRone  5 mg Oral TID   cyanocobalamin  1,000 mcg Subcutaneous Daily   dapagliflozin propanediol  10 mg Oral QAC breakfast   diltiazem  180 mg Oral Daily   fluticasone  1 spray Each Nare Daily   insulin aspart  0-20 Units Subcutaneous TID WC   isosorbide mononitrate  30 mg Oral Daily   levETIRAcetam  500 mg Oral  BID   metoprolol succinate  50 mg Oral Daily   pantoprazole  40 mg Oral Daily   potassium chloride  40 mEq Oral BID   prazosin  2 mg Oral QHS   [START ON 08/12/2021] pregabalin  100 mg Oral Daily   sodium chloride flush  3 mL Intravenous Q12H   spironolactone  25 mg Oral Daily   torsemide  40 mg Oral QPM   torsemide  60 mg Oral Daily   traZODone  200 mg Oral QHS   venlafaxine XR  225 mg Oral Q breakfast   [START ON 08/14/2021] vitamin B-12  1,000 mcg Oral Daily   Continuous Infusions:  azithromycin Stopped (08/10/21 2334)   cefTRIAXone (ROCEPHIN)  IV      LOS: 3 days   Signature  Lala Lund M.D on 08/11/2021 at 11:27 AM   -  To page go to [email or web address]

## 2021-08-11 NOTE — Progress Notes (Signed)
Pt stated he felt like a seizure was coming on after ambulating. Pt hands were shaky and he was spaced out for 2 mins then came back. Pt states he just feels very tired at this moment. BP and 02 stable at this time. MD notified. Will continue to monitor.

## 2021-08-12 DIAGNOSIS — R6520 Severe sepsis without septic shock: Secondary | ICD-10-CM | POA: Diagnosis not present

## 2021-08-12 DIAGNOSIS — A419 Sepsis, unspecified organism: Secondary | ICD-10-CM | POA: Diagnosis not present

## 2021-08-12 LAB — CBC WITH DIFFERENTIAL/PLATELET
Abs Immature Granulocytes: 0.08 10*3/uL — ABNORMAL HIGH (ref 0.00–0.07)
Basophils Absolute: 0.1 10*3/uL (ref 0.0–0.1)
Basophils Relative: 1 %
Eosinophils Absolute: 0.5 10*3/uL (ref 0.0–0.5)
Eosinophils Relative: 4 %
HCT: 39.8 % (ref 39.0–52.0)
Hemoglobin: 13.1 g/dL (ref 13.0–17.0)
Immature Granulocytes: 1 %
Lymphocytes Relative: 17 %
Lymphs Abs: 1.9 10*3/uL (ref 0.7–4.0)
MCH: 29.5 pg (ref 26.0–34.0)
MCHC: 32.9 g/dL (ref 30.0–36.0)
MCV: 89.6 fL (ref 80.0–100.0)
Monocytes Absolute: 0.8 10*3/uL (ref 0.1–1.0)
Monocytes Relative: 7 %
Neutro Abs: 7.7 10*3/uL (ref 1.7–7.7)
Neutrophils Relative %: 70 %
Platelets: 265 10*3/uL (ref 150–400)
RBC: 4.44 MIL/uL (ref 4.22–5.81)
RDW: 15.1 % (ref 11.5–15.5)
WBC: 10.9 10*3/uL — ABNORMAL HIGH (ref 4.0–10.5)
nRBC: 0 % (ref 0.0–0.2)

## 2021-08-12 LAB — GLUCOSE, CAPILLARY
Glucose-Capillary: 144 mg/dL — ABNORMAL HIGH (ref 70–99)
Glucose-Capillary: 155 mg/dL — ABNORMAL HIGH (ref 70–99)

## 2021-08-12 LAB — COMPREHENSIVE METABOLIC PANEL
ALT: 35 U/L (ref 0–44)
AST: 22 U/L (ref 15–41)
Albumin: 3.1 g/dL — ABNORMAL LOW (ref 3.5–5.0)
Alkaline Phosphatase: 63 U/L (ref 38–126)
Anion gap: 8 (ref 5–15)
BUN: 18 mg/dL (ref 8–23)
CO2: 27 mmol/L (ref 22–32)
Calcium: 8.7 mg/dL — ABNORMAL LOW (ref 8.9–10.3)
Chloride: 100 mmol/L (ref 98–111)
Creatinine, Ser: 0.93 mg/dL (ref 0.61–1.24)
GFR, Estimated: >60 mL/min (ref >60)
Glucose, Bld: 124 mg/dL — ABNORMAL HIGH (ref 70–99)
Potassium: 3.7 mmol/L (ref 3.5–5.1)
Sodium: 135 mmol/L (ref 135–145)
Total Bilirubin: 0.7 mg/dL (ref 0.3–1.2)
Total Protein: 7 g/dL (ref 6.5–8.1)

## 2021-08-12 LAB — CULTURE, BLOOD (ROUTINE X 2)
Culture: NO GROWTH
Culture: NO GROWTH
Special Requests: ADEQUATE

## 2021-08-12 LAB — BRAIN NATRIURETIC PEPTIDE: B Natriuretic Peptide: 18.1 pg/mL (ref 0.0–100.0)

## 2021-08-12 LAB — MAGNESIUM: Magnesium: 2.2 mg/dL (ref 1.7–2.4)

## 2021-08-12 MED ORDER — OXYCODONE HCL 10 MG PO TABS: 5.0000 mg | ORAL_TABLET | Freq: Three times a day (TID) | ORAL | 0 refills | Status: DC | PRN

## 2021-08-12 MED ORDER — TRAZODONE HCL 100 MG PO TABS: 100.0000 mg | ORAL_TABLET | Freq: Every day | ORAL | 0 refills | Status: DC

## 2021-08-12 MED ORDER — OXYCODONE HCL 5 MG PO TABS
5.0000 mg | ORAL_TABLET | Freq: Once | ORAL | Status: AC
  Administered 2021-08-12: 5 mg via ORAL
  Filled 2021-08-12: qty 1

## 2021-08-12 MED ORDER — LEVETIRACETAM 1000 MG PO TABS: 1000.0000 mg | ORAL_TABLET | Freq: Two times a day (BID) | ORAL | 0 refills | Status: DC

## 2021-08-12 NOTE — Discharge Summary (Signed)
Lawrence [name] DDU:[id number] DOB: [birth year] DOA: 08/07/2021  PCP: [name], PA-C  Admit date: 08/07/2021  Discharge date: 08/12/2021  Admitted From: Home   Disposition:  Home   Recommendations for Outpatient Follow-up:   Follow up with PCP in 1-2 weeks  PCP Please obtain BMP/CBC, 2 view CXR in 1week,  (see Discharge instructions)   PCP Please follow up on the following pending results: He is cut down on his sedating medications, he is on multiple medications that keep him sedated during the daytime.  Must follow with his psychiatrist for psych medication titration.  Please be cautious in prescribing high doses of narcotics and benzodiazepines   Home Health: PT, OT, social work Equipment/Devices: As below Consultations: Neurology Discharge Condition: Stable    CODE STATUS: Full    Diet Recommendation: Heart Healthy -low carbohydrate, 1.5 L fluid restriction per day.   Chief Complaint  Patient presents with   Altered Mental Status     Brief history of present illness from the day of admission and additional interim summary    66 year old morbidly obese Caucasian male with a past medical history significant for but limited to chronic diastolic CHF, atrial fibrillation, history of SVT, history of anemia, BPH, history of venous stasis, COPD, diabetes mellitus type 2, hypertension, OSA, partial seizures, PTSD as well as depression and other comorbidities who presented with ongoing altered mental status and fevers, found to have pneumonia with hypoxic respiratory failure, also on multiple sedating medications.  During his admission there was a question of breakthrough seizure versus myoclonic jerks as well ??                                                                 Hospital Course   Severe sepsis  in the setting of Pneumonia & acute hypoxic respiratory failure. he has been treated with IV antibiotics, supplemental oxygen and supportive care with nebulizer treatments.  Encouraged to sit up in chair use I-S and flutter valve for pulmonary toiletry.  Advance activity.  Overall much improved.  He is now at baseline and has finished his antibiotic course all cultures remain negative.  Toxic and metabolic encephalopathy.  Due to #1 above along with multiple sedative medications. Medications adjusted and reduced to minimize sedation, have cut down his narcotics and temazepam to half, extensively counseled him and if he can tolerate [name] to cut down on sedative medications and the risks of sedation in the setting of his morbid obesity and sleep apnea.  Counseled to sit up in chair in the daytime and increase activity.  Currently working with PT no focal deficits.  Mentation much improved after narcotics and benzodiazepines were cut in half.   Acute on chronic diastolic CHF - EF 23%, has fluid overload, continue diuretic regimen.  Follow with his cardiologist  and PCP within a week of discharge   Hypertension - stable on beta-blocker.   Paroxysmal atrial fibrillation with RVR [place] vas 2 score of greater than 4.  Seen by cardiology, currently on beta-blocker and Eliquis, echo noted with preserved EF and no acute findings.  Continue rate control.   Chest pressure/chest pain, improved - Troponin x 4 flat and not elevated, symptoms resolved after nitroglycerin, seen by cardiology, stable echo with preserved EF of 65% and no regional wall motion abnormalities or acute findings, continue beta-blocker statin and Eliquis for secondary prevention outpatient cardiology follow-up post discharge.   Right arm jerking and unresponsiveness patient question seizures versus myoclonic jerks, encephalopathy and unresponsiveness could have been due to multiple sedating medications versus postictal state of seizures. Could be  postictal and having seizures given that he has a history of seizures, seen by neurology stable EEG.  Case discussed with neurologist Dr. Quinn Axe on 08/11/2021, Keppra thousand twice daily thereafter outpatient follow-up with neurology, written instructions given not to drive till cleared by neurologist to do so.   Hypokalemia - replaced.  HLD - Continue home atorvastatin   Normocytic anemia - Likely dilutional drop as he was hemoconcentrated on admission, no acute issues.  To be monitored outpatient by PCP.   OSA - Continue CPAP nightly as needed  PTSD -  Depression > Continue with aripiprazole 5 mg p.o. daily, buspirone 5 mg p.o. 3 times daily and prazosin 2 mg p.o. nightly,  C/w Trazodone and Temazepam nightly dose reduced.   GERD/GI prophylaxis - Continue PPI  Neuropathy - Continue Lyrica, quest PCP to gradually reduce the dose.  Also on narcotics for chronic pain dose cut in half   Morbid Obesity.  BMI of close to 60.  Follow with PCP for weight loss.   Diabetes mellitus type II -continue home regimen   Discharge diagnosis     Principal Problem:   Severe sepsis ([place]) Active Problems:   OSA (obstructive sleep apnea)   PTSD (post-traumatic stress disorder)   BPH without obstruction/lower urinary tract symptoms   Acute respiratory failure with hypoxia (HCC)   Diabetes mellitus type 2 in obese (HCC)   Pure hypercholesterolemia   Chronic diastolic CHF (congestive heart failure) (HCC)   Do not resuscitate status   Diabetic neuropathy ([place])   Type 2 diabetes mellitus with diabetic polyneuropathy, with long-term current use of insulin (HCC)   Essential hypertension   Seizure (HCC)   AF (paroxysmal atrial fibrillation) (HCC)   COPD (chronic obstructive pulmonary disease) ([place])   Depression   Pneumonia    Discharge instructions    Discharge Instructions     Diet - low sodium heart healthy   Complete by: As directed    Increase activity slowly   Complete by: As directed         Discharge Medications   Allergies as of 08/12/2021       Reactions   Vancomycin Other (See Comments)   "Red Man Syndrome" 02/02/17: possible cause for rash under both arms   Niacin Other (See Comments)   Niacin And Related Other (See Comments)   Red man syndrome   Tubersol [tuberculin, Ppd] Other (See Comments)   Reaction unknown   Doxycycline Rash, Other (See Comments)        Medication List     STOP taking these medications    diazepam 5 MG tablet Commonly known as: VALIUM       TAKE these medications    Accu-Chek FastClix Lancets Misc  1 each by Other route in the morning and at bedtime.   acetaminophen 325 MG tablet Commonly known as: TYLENOL Take 2 tablets (650 mg total) by mouth every 4 (four) hours as needed for mild pain (or temp > 37.5 C (99.5 F)).   albuterol 108 (90 Base) MCG/ACT inhaler Commonly known as: VENTOLIN HFA Inhale 2 puffs into the lungs every 6 (six) hours as needed for wheezing or shortness of breath.   Alcohol Prep 70 % Pads 1 each by Other route in the morning, at noon, and at bedtime.   allopurinol 100 MG tablet Commonly known as: ZYLOPRIM Take 200 mg by mouth daily.   apixaban 5 MG Tabs tablet Commonly known as: ELIQUIS Take 1 tablet (5 mg total) by mouth 2 (two) times daily.   ARIPiprazole 5 MG tablet Commonly known as: ABILIFY TAKE ONE TABLET BY MOUTH EVERYDAY AT BEDTIME   atorvastatin 40 MG tablet Commonly known as: LIPITOR Take 1 tablet (40 mg total) by mouth every evening. TAKE ONE TABLET BY MOUTH DAILY AT 6PM   B-D ULTRAFINE III SHORT PEN 31G X 8 MM Misc Generic drug: Insulin Pen Needle 1 each by Other route See admin instructions. Use to inject insulin subcutaneously at bedtime as directed.   blood glucose meter kit and supplies Dispense based on patient and insurance preference. Use up to four times daily as directed. (FOR ICD-10 E10.9, E11.9).   busPIRone 5 MG tablet Commonly known as: BUSPAR TAKE ONE  TABLET BY MOUTH THREE TIMES DAILY   calcium carbonate 500 MG chewable tablet Commonly known as: TUMS - dosed in mg elemental calcium Chew 1 tablet by mouth daily as needed for indigestion or heartburn.   cetirizine 10 MG tablet Commonly known as: ZYRTEC Take 10 mg by mouth at bedtime.   cyanocobalamin 1000 MCG tablet Take 1 tablet (1,000 mcg total) by mouth daily.   dapagliflozin propanediol 10 MG Tabs tablet Commonly known as: Farxiga Take 1 tablet (10 mg total) by mouth daily before breakfast.   diltiazem 180 MG 24 hr capsule Commonly known as: CARDIZEM CD Take 1 capsule (180 mg total) by mouth daily.   ferrous sulfate 324 (65 Fe) MG Tbec Take 324 mg by mouth daily with breakfast.   fluticasone 50 MCG/ACT nasal spray Commonly known as: FLONASE Place 2 sprays into both nostrils daily.   isosorbide mononitrate 30 MG 24 hr tablet Commonly known as: IMDUR Take 1 tablet (30 mg total) by mouth daily.   levETIRAcetam 1000 MG tablet Commonly known as: KEPPRA Take 1 tablet (1,000 mg total) by mouth 2 (two) times daily.   metFORMIN 500 MG 24 hr tablet Commonly known as: GLUCOPHAGE-XR Take 4 tablets (2,000 mg total) by mouth daily.   metoprolol succinate 50 MG 24 hr tablet Commonly known as: TOPROL-XL Take 1 tablet (50 mg total) by mouth daily. Take with or immediately following a meal.   omeprazole 20 MG capsule Commonly known as: PRILOSEC Take 20 mg by mouth every morning.   Oxycodone HCl 10 MG Tabs Take 0.5 tablets (5 mg total) by mouth every 8 (eight) hours as needed (pain). Continue 10 mg pills in half and take it every 8 hours as needed for severe pain, if you have sedated please do not take this medication. What changed:  how much to take additional instructions   potassium chloride SA 20 MEQ tablet Commonly known as: KLOR-CON M Take 2 tablets (40 mEq total) by mouth every morning AND 1 tablet (20 mEq  total) every evening.   prazosin 2 MG capsule Commonly  known as: MINIPRESS TAKE TWO CAPSULES BY MOUTH EVERYDAY AT BEDTIME   pregabalin 150 MG capsule Commonly known as: LYRICA Take 1 capsule (150 mg total) by mouth 3 (three) times daily. Please discuss future refills with your PCP.   repaglinide 2 MG tablet Commonly known as: PRANDIN Take 2 tablets (4 mg total) by mouth 2 (two) times daily before a meal.   spironolactone 25 MG tablet Commonly known as: ALDACTONE Take 1 tablet (25 mg total) by mouth daily.   Stiolto Respimat 2.5-2.5 MCG/ACT Aers Generic drug: Tiotropium Bromide-Olodaterol Inhale 2 puffs into the lungs daily.   temazepam 15 MG capsule Commonly known as: RESTORIL TAKE ONE CAPSULE BY MOUTH EVERYDAY AT BEDTIME What changed: See the new instructions.   torsemide 20 MG tablet Commonly known as: Demadex Take 3 tablets (60 mg total) by mouth every morning AND 2 tablets (40 mg total) every evening.   traZODone 100 MG tablet Commonly known as: DESYREL Take 1 tablet (100 mg total) by mouth at bedtime. What changed: how much to take   Trulicity 4.5 YI/9.4WN Sopn Generic drug: Dulaglutide Inject 4.5 mg as directed once a week. What changed: when to take this   venlafaxine XR 75 MG 24 hr capsule Commonly known as: EFFEXOR-XR Take 3 capsules (225 mg total) by mouth daily with breakfast.   VITAMIN D3 PO Take 2,000 Units by mouth daily.         Follow-up Information     [name], PA-C. Schedule an appointment as soon as possible for a visit in 5 day(s).   Specialty: Family Medicine Why: Hospital follow up Contact information: [place] [address] [phone]         Constance Haw, MD .   Specialty: Cardiology Contact information: [address] [place] [address] [phone]         Sueanne Margarita, MD .   Specialty: Cardiology Contact information: [address] [place] [address] [phone]         Larey Dresser, MD.  Schedule an appointment as soon as possible for a visit in 1 week(s).   Specialty: Cardiology Contact information: [address] [place] [address] [phone]         [place]. Schedule an appointment as soon as possible for a visit in 1 week(s).   Why: Possible seizures versus myoclonic jerks Contact information: [address]     [place] [address] [phone]                Major procedures and Radiology Reports - PLEASE review detailed and final reports thoroughly  -        DG Chest 2 View  Result Date: 07/20/2021 CLINICAL DATA:  Dyspnea and chest pain EXAM: CHEST - 2 VIEW COMPARISON:  06/07/2021 FINDINGS: Mild cardiomegaly. Scarring and or atelectasis of the bilateral lung bases. No acute appearing airspace opacity. Disc degenerative disease of the thoracic spine. IMPRESSION: Mild cardiomegaly without acute abnormality of the lungs. Electronically Signed   By: Delanna Ahmadi M.D.   On: 07/20/2021 12:51   CT Head Wo Contrast  Result Date: 08/07/2021 CLINICAL DATA:  Mental status changes of unknown cause. EXAM: CT HEAD WITHOUT CONTRAST TECHNIQUE: Contiguous axial images were obtained from the base of the skull through the vertex without intravenous contrast. COMPARISON:  Head CT 04/27/2021.  FINDINGS: Brain: No evidence of acute infarction, hemorrhage, hydrocephalus, extra-axial collection or mass lesion/mass effect. There is early cerebral atrophy and small vessel disease. Vascular: There are mild calcifications in the carotid siphons without hyperdense central vessels. Skull: Normal. Negative for fracture or focal lesion. Sinuses/Orbits: No acute finding. There is mild chronic membrane thickening in the maxillary and ethmoid sinuses without fluid level. Other sinuses and the bilateral mastoid air cells are clear. Other: None. IMPRESSION: Chronic changes. No acute intracranial CT findings or interval  changes. Electronically Signed   By: Telford Nab M.D.   On: 08/07/2021 21:15   DG CHEST PORT 1 VIEW  Result Date: 08/11/2021 CLINICAL DATA:  66 year old male with shortness of breath. EXAM: PORTABLE CHEST 1 VIEW COMPARISON:  Portable chest 08/10/2021 and earlier. FINDINGS: Portable AP upright view at 0617 hours. Stable lung volumes and mediastinal contours over this series of exams. Chronically somewhat low lung volumes. Visualized tracheal air column is within normal limits. Allowing for portable technique the lungs are clear. No acute osseous abnormality identified. IMPRESSION: No acute cardiopulmonary abnormality. Electronically Signed   By: Genevie Ann M.D.   On: 08/11/2021 08:18   DG CHEST PORT 1 VIEW  Result Date: 08/10/2021 CLINICAL DATA:  Shortness of breath EXAM: PORTABLE CHEST 1 VIEW COMPARISON:  Chest x-ray 08/09/2021 FINDINGS: Heart is enlarged. Mediastinum appears stable. Pulmonary vasculature within normal limits. Improved aeration of the lungs since previous study. No focal consolidation identified. No significant pleural effusion. No pneumothorax. IMPRESSION: Cardiomegaly with no acute intrathoracic process identified. Improved since previous study. Electronically Signed   By: Ofilia Neas M.D.   On: 08/10/2021 08:21   DG Chest Port 1 View  Result Date: 08/09/2021 CLINICAL DATA:  Sudden onset of shortness of breath. EXAM: PORTABLE CHEST 1 VIEW COMPARISON:  08/09/2021 at 0500 hours and 08/08/2021 FINDINGS: Heart size is stable. Persistent streaky linear densities in lower lungs. Left basilar densities have slightly increased from the recent comparison examination. Upper lungs remain clear. Negative for a pneumothorax. Trachea is midline. IMPRESSION: Streaky linear densities at both lung bases. Slight progression at the left lung base. These lung densities are nonspecific but favor atelectasis. Electronically Signed   By: Markus Daft M.D.   On: 08/09/2021 10:47   DG CHEST PORT 1  VIEW  Result Date: 08/09/2021 CLINICAL DATA:  Shortness of breath. EXAM: PORTABLE CHEST 1 VIEW COMPARISON:  08/08/2021 FINDINGS: Stable cardiomediastinal contours. The lung volumes are low. Persistent subsegmental areas of atelectasis noted within both lung bases. The visualized osseous structures are unremarkable. IMPRESSION: Low lung volumes with persistent bibasilar atelectasis. Electronically Signed   By: Kerby Moors M.D.   On: 08/09/2021 07:12   DG CHEST PORT 1 VIEW  Result Date: 08/09/2021 CLINICAL DATA:  Chest pain EXAM: PORTABLE CHEST 1 VIEW COMPARISON:  08/07/2021 FINDINGS: Mild parenchymal scarring at the right lung base is unchanged. Lungs are otherwise clear. No pneumothorax or pleural effusion. Mild cardiomegaly is stable. Pulmonary vascularity is normal. No acute bone abnormality. IMPRESSION: No active disease.  Stable cardiomegaly. Electronically Signed   By: Fidela Salisbury M.D.   On: 08/09/2021 00:30   DG Chest Port 1 View  Result Date: 08/07/2021 CLINICAL DATA:  Questionable sepsis. EXAM: PORTABLE CHEST 1 VIEW COMPARISON:  Chest x-ray 02/08/2021. FINDINGS: There are patchy airspace opacities in the bilateral lung bases. There is no pleural effusion or pneumothorax identified. The cardiomediastinal silhouette is stable, the heart is enlarged. No acute fractures are seen. IMPRESSION: 1. Bilateral lower lung airspace  disease worrisome for pneumonia or edema. 2. Stable cardiomegaly. Electronically Signed   By: Ronney Asters M.D.   On: 08/07/2021 19:33   EEG adult  Result Date: 08/09/2021 Lora Havens, MD     08/09/2021  2:34 PM Patient Name: Lawrence [name] MRN: [id number] Epilepsy Attending: Lora Havens Referring Physician/Provider: Kerney Elbe, DO Date: 08/09/2021 Duration: 21.45 mins Patient history: 66yo M with shaking of arm and alteration of awareness as well as chest discomfort. EEG to evaluate for seizure. Level of alertness: Awake AEDs during EEG study: None  Technical aspects: This EEG study was done with scalp electrodes positioned according to the 10-20 International system of electrode placement. Electrical activity was acquired at a sampling rate of $Remov'500Hz'UgdiqQ$  and reviewed with a high frequency filter of $RemoveB'70Hz'xNBkjRjf$  and a low frequency filter of $RemoveB'1Hz'GdbynMMz$ . EEG data were recorded continuously and digitally stored. Description: The posterior dominant rhythm consists of 8 Hz activity of moderate voltage (25-35 uV) seen predominantly in posterior head regions, symmetric and reactive to eye opening and eye closing. Hyperventilation and photic stimulation were not performed.   IMPRESSION: This study is within normal limits. No seizures or epileptiform discharges were seen throughout the recording. Lora Havens   ECHOCARDIOGRAM LIMITED  Result Date: 08/10/2021    ECHOCARDIOGRAM LIMITED REPORT   Patient Name:   Lawrence [name] Date of Exam: 08/10/2021 Medical Rec #:  [id number]        Height:       74.0 in Accession #:    [id number]       Weight:       382.0 lb Date of Birth:  01/06/1956        BSA:          2.861 m Patient Age:    89 years         BP:           116/72 mmHg Patient Gender: M                HR:           84 bpm. Exam Location:  Inpatient Procedure: Limited Echo Indications:    Chest pain  History:        Patient has prior history of Echocardiogram examinations, most                 recent 06/08/2021. CHF, COPD, Signs/Symptoms:Shortness of Breath;                 Risk Factors:Hypertension and Morbid obesity. Volume overload.  Sonographer:    Merrie Roof RDCS Referring Phys: [id number] [place]  1. Limited echo  2. Left ventricular ejection fraction, by estimation, is 60 to 65%. The left ventricle has normal function. The left ventricle has no regional wall motion abnormalities. Left ventricular diastolic parameters are indeterminate.  3. Right ventricular systolic function was not well visualized. The right ventricular size is not well visualized.  Tricuspid regurgitation signal is inadequate for assessing PA pressure.  4. The mitral valve is normal in structure. No evidence of mitral stenosis.  5. The aortic valve was not well visualized. Unable to determine aortic valve morphology due to image quality.  6. The inferior vena cava is dilated in size with >50% respiratory variability, suggesting right atrial pressure of 8 mmHg. FINDINGS  Left Ventricle: Left ventricular ejection fraction, by estimation, is 60 to 65%. The left ventricle has normal function. The left ventricle has no regional wall motion  abnormalities. The left ventricular internal cavity size was normal in size. There is  no left ventricular hypertrophy. Left ventricular diastolic parameters are indeterminate. Right Ventricle: The right ventricular size is not well visualized. Right vetricular wall thickness was not assessed. Right ventricular systolic function was not well visualized. Tricuspid regurgitation signal is inadequate for assessing PA pressure. Left Atrium: Left atrial size was normal in size. Right Atrium: Right atrial size was normal in size. Pericardium: There is no evidence of pericardial effusion. Presence of epicardial fat layer. Mitral Valve: The mitral valve is normal in structure. No evidence of mitral valve stenosis. Tricuspid Valve: The tricuspid valve is normal in structure. No evidence of tricuspid stenosis. Aortic Valve: The aortic valve was not well visualized. Pulmonic Valve: The pulmonic valve was not well visualized. Aorta: The aortic root is normal in size and structure. Venous: The inferior vena cava is dilated in size with greater than 50% respiratory variability, suggesting right atrial pressure of 8 mmHg. IAS/Shunts: No atrial level shunt detected by color flow Doppler. IVC IVC diam: 2.70 cm Kardie Tobb DO Electronically signed by Berniece Salines DO Signature Date/Time: 08/10/2021/6:15:32 PM    Final      Today   Subjective    Lawrence [name] today has no  headache,no chest abdominal pain,no new weakness tingling or numbness, feels much better wants to go home today.     Objective   Blood pressure 102/61, pulse 86, temperature 98.9 F (37.2 C), temperature source Oral, resp. rate 18, height $RemoveBe'6\' 2"'NsBuuDmmv$  (1.88 m), weight (!) 210.9 kg, SpO2 92 %.   Intake/Output Summary (Last 24 hours) at 08/12/2021 1017 Last data filed at 08/11/2021 2246 Gross per 24 hour  Intake 1468 ml  Output 3795 ml  Net -2327 ml    Exam  Awake Alert, No new F.N deficits, Normal affect [place].AT,PERRAL Supple Neck,No JVD, No cervical lymphadenopathy appriciated.  Symmetrical Chest wall movement, Good air movement bilaterally, CTAB RRR,No Gallops,Rubs or new Murmurs, No Parasternal Heave +ve B.Sounds, Abd Soft, Non tender, No organomegaly appriciated, No rebound -guarding or rigidity. No Cyanosis, Clubbing or edema, No new Rash or bruise   Data Review   CBC w Diff:  Lab Results  Component Value Date   WBC 10.9 (H) 08/12/2021   HGB 13.1 08/12/2021   HCT 39.8 08/12/2021   PLT 265 08/12/2021   LYMPHOPCT 17 08/12/2021   MONOPCT 7 08/12/2021   EOSPCT 4 08/12/2021   BASOPCT 1 08/12/2021    CMP:  Lab Results  Component Value Date   NA 135 08/12/2021   NA 136 01/07/2021   K 3.7 08/12/2021   CL 100 08/12/2021   CO2 27 08/12/2021   BUN 18 08/12/2021   BUN 22 01/07/2021   CREATININE 0.93 08/12/2021   GLU 102 02/16/2017   PROT 7.0 08/12/2021   ALBUMIN 3.1 (L) 08/12/2021   BILITOT 0.7 08/12/2021   ALKPHOS 63 08/12/2021   AST 22 08/12/2021   ALT 35 08/12/2021  .   Total Time in preparing paper work, data evaluation and todays exam - 36 minutes  Lala Lund M.D on 08/12/2021 at 10:17 AM  [facility]

## 2021-08-12 NOTE — Progress Notes (Signed)
Physical Therapy Treatment Patient Details Name: Lawrence [name] MRN: [id number] DOB: [birth year] Today's Date: 08/12/2021   History of Present Illness 66 y.o. male presenting to ED 12/31 with fever and AMS. Patient found to have severe sepsis, pneumonia and acute respiratory failure with hypoxia. EEG (-). PMHx significant for DMII, HTN, COPD, CVA, parietal seizure, PTSD, diastolic CHF, A-fib, depression, OSA and HLD.    PT Comments    Patient getting OOB with nursing on arrival. States he has been getting up often with nursing and nursing concurs. Able to ambulate in hallway with PT and on room air with sats 91% throughout. Briefly reviewed lower body exercises he should continue to do while hospitalized to maintain strength (pt demonstrated) and education on elevating legs intermittently to also help manage swelling.     Recommendations for follow up therapy are one component of a multi-disciplinary discharge planning process, led by the attending physician.  Recommendations may be updated based on patient status, additional functional criteria and insurance authorization.  Follow Up Recommendations  No PT follow up     Assistance Recommended at Discharge PRN  Patient can return home with the following Assistance with cooking/housework;Help with stairs or ramp for entrance   Equipment Recommendations  None recommended by PT    Recommendations for Other Services       Precautions / Restrictions Precautions Precautions: Fall     Mobility  Bed Mobility               General bed mobility comments: up in chair on arrival    Transfers Overall transfer level: Needs assistance Equipment used: Rolling walker (2 wheels) Transfers: Sit to/from Stand Sit to Stand: Modified independent (Device/Increase time)           General transfer comment: no cues needed with proper use    Ambulation/Gait Ambulation/Gait assistance: Supervision Gait Distance (Feet): 75  Feet Assistive device: Rolling walker (2 wheels) (bari RW) Gait Pattern/deviations: Step-through pattern;Decreased stride length       General Gait Details: on room air with sats 91%; pt good judge of his stamina and chose velocity and distance appropriately   Stairs             Wheelchair Mobility    Modified Rankin (Stroke Patients Only)       Balance Overall balance assessment: Needs assistance Sitting-balance support: Single extremity supported;No upper extremity supported;Feet supported Sitting balance-Leahy Scale: Good     Standing balance support: Bilateral upper extremity supported;Reliant on assistive device for balance;During functional activity Standing balance-Leahy Scale: Fair                              Cognition Arousal/Alertness: Awake/alert Behavior During Therapy: WFL for tasks assessed/performed Overall Cognitive Status: Within Functional Limits for tasks assessed                                          Exercises      General Comments        Pertinent Vitals/Pain Pain Assessment: No/denies pain Breathing: normal Negative Vocalization: none Facial Expression: smiling or inexpressive Body Language: relaxed Consolability: no need to console PAINAD Score: 0    Home Living                          Prior  Function            PT Goals (current goals can now be found in the care plan section) Acute Rehab PT Goals Patient Stated Goal: be strong enough to go home and resume HHPT (working on balance and strength) PT Goal Formulation: With patient Time For Goal Achievement: 08/24/21 Potential to Achieve Goals: Good Progress towards PT goals: Progressing toward goals    Frequency    Min 3X/week      PT Plan Discharge plan needs to be updated    Co-evaluation              AM-PAC PT "6 Clicks" Mobility   Outcome Measure  Help needed turning from your back to your side while in a  flat bed without using bedrails?: A Little Help needed moving from lying on your back to sitting on the side of a flat bed without using bedrails?: A Little Help needed moving to and from a bed to a chair (including a wheelchair)?: A Little Help needed standing up from a chair using your arms (e.g., wheelchair or bedside chair)?: A Little Help needed to walk in hospital room?: A Little Help needed climbing 3-5 steps with a railing? : A Little 6 Click Score: 18    End of Session   Activity Tolerance: Patient tolerated treatment well Patient left: with call bell/phone within reach;in chair Nurse Communication: Mobility status;Other (comment) (left on room air after ambulatory sat assessment) PT Visit Diagnosis: Difficulty in walking, not elsewhere classified (R26.2)     Time: 4356-8616 PT Time Calculation (min) (ACUTE ONLY): 10 min  Charges:  $Gait Training: 8-22 mins                      Arby Barrette, PT [place]  Pager [phone] Office [phone]    Rexanne Mano 08/12/2021, 10:49 AM

## 2021-08-12 NOTE — Progress Notes (Signed)
Pt given discharge instructions, prescriptions, and care notes. Pt verbalized understanding AEB no further questions or concerns at this time. IV was discontinued, no redness, pain, or swelling noted at this time. Telemetry discontinued and Centralized Telemetry was notified. Pt left the floor via PTAR in stable condition.

## 2021-08-12 NOTE — Discharge Instructions (Addendum)
Do not drive till you are cleared to do so by the neurologist.    Follow with Primary MD [name], PA-C and your psychiatrist in 7 days   Get CBC, CMP, 2 view Chest X ray -  checked next visit within 1 week by Primary MD   Activity: As tolerated with Full fall precautions use walker/cane & assistance as needed  Disposition Home   Diet: Heart Healthy -low carbohydrate diet with 1.5 liter fluid restriction per day, check your Weight same time everyday, if you gain over 2 pounds, or you develop in leg swelling, experience more shortness of breath or chest pain, call your Primary MD immediately. Follow Cardiac Low Salt Diet and 1.5 lit/day fluid restriction.  Check CBGs q. [place].  Special Instructions: If you have smoked or chewed Tobacco  in the last 2 yrs please stop smoking, stop any regular Alcohol  and or any Recreational drug use.  On your next visit with your primary care physician please Get Medicines reviewed and adjusted.  Please request your Prim.MD to go over all Hospital Tests and Procedure/Radiological results at the follow up, please get all Hospital records sent to your Prim MD by signing hospital release before you go home.  If you experience worsening of your admission symptoms, develop shortness of breath, life threatening emergency, suicidal or homicidal thoughts you must seek medical attention immediately by calling 911 or calling your MD immediately  if symptoms less severe.  You Must read complete instructions/literature along with all the possible adverse reactions/side effects for all the Medicines you take and that have been prescribed to you. Take any new Medicines after you have completely understood and accpet all the possible adverse reactions/side effects.       Information on my medicine - ELIQUIS (apixaban)  This medication education was reviewed with me or my healthcare representative as part of my discharge preparation.   Why was Eliquis  prescribed for you? Eliquis was prescribed for you to reduce the risk of a blood clot forming that can cause a stroke if you have a medical condition called atrial fibrillation (a type of irregular heartbeat).  What do You need to know about Eliquis ? Take your Eliquis TWICE DAILY - one tablet in the morning and one tablet in the evening with or without food. If you have difficulty swallowing the tablet whole please discuss with your pharmacist how to take the medication safely.  Take Eliquis exactly as prescribed by your doctor and DO NOT stop taking Eliquis without talking to the doctor who prescribed the medication.  Stopping may increase your risk of developing a stroke.  Refill your prescription before you run out.  After discharge, you should have regular check-up appointments with your healthcare provider that is prescribing your Eliquis.  In the future your dose may need to be changed if your kidney function or weight changes by a significant amount or as you get older.  What do you do if you miss a dose? If you miss a dose, take it as soon as you remember on the same day and resume taking twice daily.  Do not take more than one dose of ELIQUIS at the same time to make up a missed dose.  Important Safety Information A possible side effect of Eliquis is bleeding. You should call your healthcare provider right away if you experience any of the following: Bleeding from an injury or your nose that does not stop. Unusual colored urine (red or dark  brown) or unusual colored stools (red or black). Unusual bruising for unknown reasons. A serious fall or if you hit your head (even if there is no bleeding).  Some medicines may interact with Eliquis and might increase your risk of bleeding or clotting while on Eliquis. To help avoid this, consult your healthcare provider or pharmacist prior to using any new prescription or non-prescription medications, including herbals, vitamins,  non-steroidal anti-inflammatory drugs (NSAIDs) and supplements.  This website has more information on Eliquis (apixaban): http://www.eliquis.com/eliquis/home

## 2021-08-12 NOTE — Progress Notes (Signed)
SATURATION QUALIFICATIONS: (This note is used to comply with regulatory documentation for home oxygen)  Patient Saturations on Room Air at Rest = 93%  Patient Saturations on Room Air while Ambulating = 91%    Please briefly explain why patient needs home oxygen: Patient did not need oxygen for activity.   Arby Barrette, PT Acute Rehabilitation Services  Pager [phone] Office [phone]

## 2021-08-12 NOTE — Progress Notes (Signed)
Occupational Therapy Treatment Patient Details Name: Lawrence [name] MRN: [id number] DOB: 1956/05/14 Today's Date: 08/12/2021   History of present illness 66 y.o. male presenting to ED 12/31 with fever and AMS. Patient found to have severe sepsis, pneumonia and acute respiratory failure with hypoxia. EEG (-). PMHx significant for DMII, HTN, COPD, CVA, parietal seizure, PTSD, diastolic CHF, A-fib, depression, OSA and HLD.   OT comments  OT treatment session with focus on self-care re-education, functional transfers, household mobility with bari RW and activity tolerance. Patient continues to be limited by deficits listed below and would benefit from continued acute OT services in prep for safe d/c home with family. OT will continue to follow acutely.    Recommendations for follow up therapy are one component of a multi-disciplinary discharge planning process, led by the attending physician.  Recommendations may be updated based on patient status, additional functional criteria and insurance authorization.    Follow Up Recommendations  Home health OT    Assistance Recommended at Discharge Frequent or constant Supervision/Assistance  Patient can return home with the following  A little help with bathing/dressing/bathroom;A little help with walking and/or transfers;Assistance with cooking/housework   Equipment Recommendations  None recommended by OT    Recommendations for Other Services      Precautions / Restrictions Precautions Precautions: Fall Restrictions Weight Bearing Restrictions: No       Mobility Bed Mobility               General bed mobility comments: Seated in recliner upon entry.    Transfers Overall transfer level: Needs assistance Equipment used: Rolling walker (2 wheels) Transfers: Sit to/from Stand Sit to Stand: Modified independent (Device/Increase time)           General transfer comment: no cues needed with proper use     Balance Overall  balance assessment: Needs assistance Sitting-balance support: Single extremity supported;No upper extremity supported;Feet supported Sitting balance-Leahy Scale: Good     Standing balance support: Bilateral upper extremity supported;Reliant on assistive device for balance;During functional activity Standing balance-Leahy Scale: Fair                             ADL either performed or assessed with clinical judgement   ADL Overall ADL's : Needs assistance/impaired     Grooming: Set up;Sitting Grooming Details (indicate cue type and reason): 2/3 grooming tasks seated at sink level. Declined grooming tasks in standing. Reports sitting at baseline. Upper Body Bathing: Set up;Sitting               Toilet Transfer: Minimal assistance;Rolling walker (2 wheels) Toilet Transfer Details (indicate cue type and reason): Simulated with transfser to recliner using bari RW. Increased time/effort 2/2 chronic R knee pain.                Extremity/Trunk Assessment              Vision       Perception     Praxis      Cognition Arousal/Alertness: Awake/alert Behavior During Therapy: WFL for tasks assessed/performed Overall Cognitive Status: Within Functional Limits for tasks assessed                                            Exercises     Shoulder Instructions  General Comments VSS on RA.    Pertinent Vitals/ Pain       Pain Assessment: 0-10 Pain Score: 5  Breathing: normal Negative Vocalization: none Facial Expression: smiling or inexpressive Body Language: relaxed Consolability: no need to console PAINAD Score: 0 Pain Location: R knee (chronic) Pain Descriptors / Indicators: Aching;Sore Pain Intervention(s): Monitored during session  Home Living                                          Prior Functioning/Environment              Frequency  Min 2X/week        Progress Toward Goals  OT  Goals(current goals can now be found in the care plan section)  Progress towards OT goals: Progressing toward goals  Acute Rehab OT Goals Patient Stated Goal: To return home. OT Goal Formulation: With patient Time For Goal Achievement: 08/24/21 Potential to Achieve Goals: Good ADL Goals Pt Will Perform Grooming: with supervision;standing Pt Will Transfer to Toilet: with supervision;bedside commode Additional ADL Goal #1: Patient will tolerate 15 mintues of therapeutic activity without need for rest break indicating improved activity tolerance in prep for ADLs.  Plan Discharge plan remains appropriate;Frequency remains appropriate    Co-evaluation                 AM-PAC OT "6 Clicks" Daily Activity     Outcome Measure   Help from another person eating meals?: None Help from another person taking care of personal grooming?: A Little Help from another person toileting, which includes using toliet, bedpan, or urinal?: A Little Help from another person bathing (including washing, rinsing, drying)?: A Little Help from another person to put on and taking off regular upper body clothing?: A Little Help from another person to put on and taking off regular lower body clothing?: A Little 6 Click Score: 19    End of Session Equipment Utilized During Treatment: Rolling walker (2 wheels);Oxygen  OT Visit Diagnosis: Unsteadiness on feet (R26.81);Muscle weakness (generalized) (M62.81);Pain Pain - Right/Left: Right Pain - part of body: Knee   Activity Tolerance Patient tolerated treatment well   Patient Left in chair;with call bell/phone within reach;with family/visitor present   Nurse Communication Mobility status        Time: 6226-3335 OT Time Calculation (min): 25 min  Charges: OT General Charges $OT Visit: 1 Visit OT Treatments $Self Care/Home Management : 23-37 mins  Milinda Sweeney H. OTR/L Supplemental OT, Department of rehab services [phone]  Eli Pattillo R  H. 08/12/2021, 11:15 AM

## 2021-08-12 NOTE — TOC Transition Note (Addendum)
Transition of Care [facility]) - CM/SW Discharge Note   Patient Details  Name: Lawrence [name] MRN: [id number] Date of Birth: 1955-09-06  Transition of Care [facility]) CM/SW Contact:  Cyndi Bender, RN Phone Number: 08/12/2021, 10:17 AM   Clinical Narrative:    Patient stable for discharge. Stacie at [facility] notified of discharge. PTAR called for transportation home.  Address, Phone number and PCP verified  Final next level of care: [place] Barriers to Discharge: Barriers Resolved   Patient Goals and CMS Choice Patient states their goals for this hospitalization and ongoing recovery are:: return home      Discharge Placement               home        Discharge Plan and Services   Discharge Planning Services: CM Consult Post Acute Care Choice: Durable Medical Equipment, Home Health                    HH Arranged: PT, OT, Social Work [facility] Agency: [place] Date [place]: 08/11/21 Time [place]: 1509 Representative spoke with at [place]: [place] ([place]) Interventions     Readmission Risk Interventions Readmission Risk Prevention Plan 08/12/2021 08/12/2021 02/11/2021  Transportation Screening Complete Complete Complete  PCP or Specialist Appt within 5-7 Days - - -  Home Care Screening - - -  Medication Review (RN CM) - - -  Medication Review (RN Transport planner) Complete Complete Complete  PCP or Specialist appointment within 3-5 days of discharge Complete - -  [place] or Home Care Consult Complete - Complete  SW Recovery Care/Counseling Consult Complete - Complete  Palliative Care Screening Not Applicable - Not [place] Not Applicable - Not Applicable  Some recent data might be hidden

## 2021-08-17 ENCOUNTER — Other Ambulatory Visit: Payer: Self-pay | Admitting: Adult Health

## 2021-08-17 DIAGNOSIS — E1142 Type 2 diabetes mellitus with diabetic polyneuropathy: Secondary | ICD-10-CM | POA: Diagnosis not present

## 2021-08-17 DIAGNOSIS — J440 Chronic obstructive pulmonary disease with acute lower respiratory infection: Secondary | ICD-10-CM | POA: Diagnosis not present

## 2021-08-17 DIAGNOSIS — I130 Hypertensive heart and chronic kidney disease with heart failure and stage 1 through stage 4 chronic kidney disease, or unspecified chronic kidney disease: Secondary | ICD-10-CM | POA: Diagnosis not present

## 2021-08-17 DIAGNOSIS — J9601 Acute respiratory failure with hypoxia: Secondary | ICD-10-CM | POA: Diagnosis not present

## 2021-08-17 DIAGNOSIS — N189 Chronic kidney disease, unspecified: Secondary | ICD-10-CM | POA: Diagnosis not present

## 2021-08-17 DIAGNOSIS — J189 Pneumonia, unspecified organism: Secondary | ICD-10-CM | POA: Diagnosis not present

## 2021-08-17 DIAGNOSIS — E1122 Type 2 diabetes mellitus with diabetic chronic kidney disease: Secondary | ICD-10-CM | POA: Diagnosis not present

## 2021-08-17 DIAGNOSIS — G4700 Insomnia, unspecified: Secondary | ICD-10-CM

## 2021-08-17 DIAGNOSIS — I5033 Acute on chronic diastolic (congestive) heart failure: Secondary | ICD-10-CM | POA: Diagnosis not present

## 2021-08-17 DIAGNOSIS — M1711 Unilateral primary osteoarthritis, right knee: Secondary | ICD-10-CM | POA: Diagnosis not present

## 2021-08-19 ENCOUNTER — Encounter (HOSPITAL_COMMUNITY): Payer: Self-pay | Admitting: Cardiology

## 2021-08-19 ENCOUNTER — Ambulatory Visit (HOSPITAL_COMMUNITY)
Admission: RE | Admit: 2021-08-19 | Discharge: 2021-08-19 | Disposition: A | Discharge status: Home / Self Care | Payer: Medicare HMO | Source: Ambulatory Visit | Attending: Cardiology | Admitting: Cardiology

## 2021-08-19 ENCOUNTER — Other Ambulatory Visit: Payer: Self-pay

## 2021-08-19 VITALS — BP 108/60 | HR 81 | Wt 384.0 lb

## 2021-08-19 DIAGNOSIS — I5032 Chronic diastolic (congestive) heart failure: Secondary | ICD-10-CM | POA: Diagnosis not present

## 2021-08-19 DIAGNOSIS — I480 Paroxysmal atrial fibrillation: Secondary | ICD-10-CM | POA: Insufficient documentation

## 2021-08-19 DIAGNOSIS — Z6841 Body Mass Index (BMI) 40.0 and over, adult: Secondary | ICD-10-CM | POA: Diagnosis not present

## 2021-08-19 DIAGNOSIS — I350 Nonrheumatic aortic (valve) stenosis: Secondary | ICD-10-CM | POA: Diagnosis not present

## 2021-08-19 DIAGNOSIS — G4733 Obstructive sleep apnea (adult) (pediatric): Secondary | ICD-10-CM | POA: Insufficient documentation

## 2021-08-19 DIAGNOSIS — I110 Hypertensive heart disease with heart failure: Secondary | ICD-10-CM | POA: Insufficient documentation

## 2021-08-19 DIAGNOSIS — R6889 Other general symptoms and signs: Secondary | ICD-10-CM | POA: Diagnosis not present

## 2021-08-19 DIAGNOSIS — Z79899 Other long term (current) drug therapy: Secondary | ICD-10-CM | POA: Insufficient documentation

## 2021-08-19 DIAGNOSIS — Z7901 Long term (current) use of anticoagulants: Secondary | ICD-10-CM | POA: Diagnosis not present

## 2021-08-19 LAB — BRAIN NATRIURETIC PEPTIDE: B Natriuretic Peptide: 37 pg/mL (ref 0.0–100.0)

## 2021-08-19 LAB — CBC
HCT: 42.8 % (ref 39.0–52.0)
Hemoglobin: 13.6 g/dL (ref 13.0–17.0)
MCH: 28.8 pg (ref 26.0–34.0)
MCHC: 31.8 g/dL (ref 30.0–36.0)
MCV: 90.7 fL (ref 80.0–100.0)
Platelets: 354 10*3/uL (ref 150–400)
RBC: 4.72 MIL/uL (ref 4.22–5.81)
RDW: 14.8 % (ref 11.5–15.5)
WBC: 11.6 10*3/uL — ABNORMAL HIGH (ref 4.0–10.5)
nRBC: 0 % (ref 0.0–0.2)

## 2021-08-19 LAB — BASIC METABOLIC PANEL
Anion gap: 14 (ref 5–15)
BUN: 18 mg/dL (ref 8–23)
CO2: 28 mmol/L (ref 22–32)
Calcium: 9.5 mg/dL (ref 8.9–10.3)
Chloride: 98 mmol/L (ref 98–111)
Creatinine, Ser: 0.84 mg/dL (ref 0.61–1.24)
GFR, Estimated: >60 mL/min (ref >60)
Glucose, Bld: 114 mg/dL — ABNORMAL HIGH (ref 70–99)
Potassium: 3.9 mmol/L (ref 3.5–5.1)
Sodium: 140 mmol/L (ref 135–145)

## 2021-08-19 NOTE — Patient Instructions (Signed)
No medication change  Labs today We will only contact you if something comes back abnormal or we need to make some changes. Otherwise no news is good news!  Your physician recommends that you schedule a follow-up appointment in: 1 month with Nurse Practitioner/Physician Assistant   [place] [id number] [place] [address] Dept: [phone] Loc: [address]  08/19/2021  You are scheduled for a Transesophageal echocardiogram and Cardiac Catheterization with [facility] TBD.    1. Please arrive at the [facility] (Main Entrance A) at [facility]: [address] [place], [place] [address] at TBD (This time is two hours before your procedure to ensure your preparation). Free valet parking service is available.   Special note: Every effort is made to have your procedure done on time. Please understand that emergencies sometimes delay scheduled procedures.  2. Diet: Do not eat solid foods or drink after midnight  3. Labs: done in office  4. Medication instructions in preparation for your procedure:   HOLD Eliquis day before procedure and day of procedure  HOLD Diabetic medications on the morning of your procedure and for 48 hours after your procedure.    5. Plan for one night stay--bring personal belongings. 6. Bring a current list of your medications and current insurance cards. 7. You MUST have a responsible person to drive you home. 8. Someone MUST be with you the first 24 hours after you arrive home or your discharge will be delayed. 9. Please wear clothes that are easy to get on and off and wear slip-on shoes.  Thank you for allowing [place] to care for you!   -- [place] [facility]

## 2021-08-20 ENCOUNTER — Telehealth (HOSPITAL_COMMUNITY): Payer: Self-pay | Admitting: Cardiology

## 2021-08-20 NOTE — Telephone Encounter (Addendum)
Instructions below reviewed with patient   Lawrence [name]                  08/19/2021   You are scheduled for a Transesophageal echocardiogram and Cardiac Catheterization with [facility] 08/26/2021   1. Please arrive at the [facility] (Main Entrance A) at [facility]: [address] [place], [place] [address] at 10:30(This time is two hours before your procedure to ensure your preparation). Free valet parking service is available.    Special note: Every effort is made to have your procedure done on time. Please understand that emergencies sometimes delay scheduled procedures.   2. Diet: Do not eat solid foods or drink after midnight   3. Labs: done in office   4. Medication instructions in preparation for your procedure:     HOLD Eliquis day before procedure and day of procedure   HOLD Diabetic medications on the morning of your procedure and for 48 hours after your procedure.      5. Plan for one night stay--bring personal belongings. 6. Bring a current list of your medications and current insurance cards. 7. You MUST have a responsible person to drive you home. 8. Someone MUST be with you the first 24 hours after you arrive home or your discharge will be delayed. 9. Please wear clothes that are easy to get on and off and wear slip-on shoes.   Thank you for allowing [place] to care for you!   -- [place] [facility]           Once you are at home  Index on the pillow and send a transmission every morning Make sure to take your Plavix 75 mg daily for one month and Aspirin __81___mg daily for life Our office will call you weekly for the first 3 weeks You will be seen for a follow up appointment as scheduled You will receive periodic phone calls either from our office or automated system You will receive a bill from our office once a month

## 2021-08-20 NOTE — H&P (View-Only) (Signed)
PCP: Wharton, Courtney, PA-C °Cardiology: Dr. Turner °HF Cardiology: Dr. Milo Solana ° °65 y.o. with history of paroxysmal atrial fibrillation, OSA/?OHS, morbid obesity, and chronic diastolic CHF was referred by Dr. Turner for evaluation of CHF.  Patient has been admitted multiple times in the last year for CHF exacerbations.  He uses a walker around the house and a motorized scooter outside the house.   He had a negative cath in 2019 and a negative Cardiolite in 4/22.  Most recent echo in 11/22 was a difficult study but showed EF 60-65%, mild LVH, mildly dilated LV, moderate aortic stenosis (trileaflet aortic valve) with mean gradient 22 mmHg, DI 0.27.  ° °He was admitted in 12/22 with fever/PNA as well as chest pain (ruled out for MI).  He had atrial fibrillation/RVR this admission but converted back to NSR.  He was diuresed. Additionally, he was thought to have a partial seizure and Keppra was restarted.  ° °Patient returns today for followup of CHF.  Activity remains significantly limited.  He is short of breath walking across his house.  This is chronic. He has orthopnea.  He continues to have occasional episodes of atypical chest pain, no change from the past.  Worsening fatigue, thinks it may be due to Keppra.  He using Bipap nightly.  ° °ECG (personally reviewed): NSR, 1st degree AVB with PACs ° °Labs (11/22): K 3.7, creatinine 0.89 °Labs (1/23): K 3.7, creatinine 0.93, BNP 18 ° °PMH: °1. Type 2 diabetes °2. Atrial fibrillation: Paroxysmal.  °3. HTN °4. Hyperlipidemia °5. OSA, ?OHS: Uses Bipap.  °6. Obesity °7. SVT °8. Chest pain: LHC in 6/19 with minimal CAD.  °- Cardiolite (4/22): No ischemia.  °9. Depression/anxiety.  °10. Chronic diastolic CHF: Echo in 11/22 with EF 60-65%, mild LVH, mildly dilated LV, moderate aortic stenosis (trileaflet aortic valve) with mean gradient 22 mmHg, DI 0.27.  °11. Partial seizures ° °Social History  ° °Socioeconomic History  ° Marital status: Significant Other  °  Spouse name:  Janet Ayotte  ° Number of children: 1  ° Years of education: Not on file  ° Highest education level: Associate degree: academic program  °Occupational History  ° Occupation: retired  °  Comment: [employer]  °Tobacco Use  ° Smoking status: Former  °  Packs/day: 1.00  °  Years: 44.00  °  Pack years: 44.00  °  Types: Cigarettes  °  Quit date: 03/07/2016  °  Years since quitting: 5.4  °  Passive exposure: Past  ° Smokeless tobacco: Never  °Vaping Use  ° Vaping Use: Never used  °Substance and Sexual Activity  ° Alcohol use: Yes  °  Comment: rare  ° Drug use: No  ° Sexual activity: Not on file  °Other Topics Concern  ° Not on file  °Social History Narrative  ° Lives with wife Janet  ° Right Handed  ° Drinks 1-2 cups caffeine  ° °Social Determinants of Health  ° °Financial Resource Strain: Medium Risk  ° Difficulty of Paying Living Expenses: Somewhat hard  °Food Insecurity: Food Insecurity Present  ° Worried About Running Out of Food in the Last Year: Sometimes true  ° Ran Out of Food in the Last Year: Never true  °Transportation Needs: Unmet Transportation Needs  ° Lack of Transportation (Medical): Yes  ° Lack of Transportation (Non-Medical): No  °Physical Activity: Not on file  °Stress: Not on file  °Social Connections: Not on file  °Intimate Partner Violence: Not on file  ° °Family History  °  Problem Relation Age of Onset  ° CAD Maternal Grandfather   ° Diabetes Other   ° Diabetes Mellitus II Neg Hx   ° Colon cancer Neg Hx   ° Esophageal cancer Neg Hx   ° Inflammatory bowel disease Neg Hx   ° Liver disease Neg Hx   ° Pancreatic cancer Neg Hx   ° Rectal cancer Neg Hx   ° Stomach cancer Neg Hx   ° Sleep apnea Neg Hx   ° °ROS: All systems reviewed and negative except as per HPI.  ° °Current Outpatient Medications  °Medication Sig Dispense Refill  ° Accu-Chek FastClix Lancets MISC 1 each by Other route in the morning and at bedtime.    ° acetaminophen (TYLENOL) 325 MG tablet Take 2 tablets (650 mg total) by mouth every  4 (four) hours as needed for mild pain (or temp > 37.5 C (99.5 F)).    ° albuterol (VENTOLIN HFA) 108 (90 Base) MCG/ACT inhaler Inhale 2 puffs into the lungs every 6 (six) hours as needed for wheezing or shortness of breath.    ° Alcohol Swabs (ALCOHOL PREP) 70 % PADS 1 each by Other route in the morning, at noon, and at bedtime.    ° allopurinol (ZYLOPRIM) 100 MG tablet Take 200 mg by mouth daily.    ° apixaban (ELIQUIS) 5 MG TABS tablet Take 1 tablet (5 mg total) by mouth 2 (two) times daily. 180 tablet 2  ° ARIPiprazole (ABILIFY) 5 MG tablet TAKE ONE TABLET BY MOUTH EVERYDAY AT BEDTIME 30 tablet 0  ° atorvastatin (LIPITOR) 40 MG tablet Take 1 tablet (40 mg total) by mouth every evening. TAKE ONE TABLET BY MOUTH DAILY AT 6PM 90 tablet 3  ° B-D ULTRAFINE III SHORT PEN 31G X 8 MM MISC 1 each by Other route See admin instructions. Use to inject insulin subcutaneously at bedtime as directed.    ° blood glucose meter kit and supplies Dispense based on patient and insurance preference. Use up to four times daily as directed. (FOR ICD-10 E10.9, E11.9). 1 each 0  ° busPIRone (BUSPAR) 5 MG tablet TAKE ONE TABLET BY MOUTH THREE TIMES DAILY 270 tablet 1  ° calcium carbonate (TUMS - DOSED IN MG ELEMENTAL CALCIUM) 500 MG chewable tablet Chew 1 tablet by mouth daily as needed for indigestion or heartburn.    ° cetirizine (ZYRTEC) 10 MG tablet Take 10 mg by mouth at bedtime.     ° Cholecalciferol (VITAMIN D3 PO) Take 2,000 Units by mouth daily.    ° dapagliflozin propanediol (FARXIGA) 10 MG TABS tablet Take 1 tablet (10 mg total) by mouth daily before breakfast. 90 tablet 3  ° diltiazem (CARDIZEM CD) 180 MG 24 hr capsule Take 1 capsule (180 mg total) by mouth daily. 90 capsule 3  ° Dulaglutide (TRULICITY) 4.5 MG/0.5ML SOPN Inject 4.5 mg as directed once a week. 4.5 mL 3  ° ferrous sulfate 324 (65 Fe) MG TBEC Take 324 mg by mouth daily with breakfast.     ° fluticasone (FLONASE) 50 MCG/ACT nasal spray Place 2 sprays into both  nostrils daily.     ° isosorbide mononitrate (IMDUR) 30 MG 24 hr tablet Take 1 tablet (30 mg total) by mouth daily. 90 tablet 2  ° levETIRAcetam (KEPPRA) 1000 MG tablet Take 1 tablet (1,000 mg total) by mouth 2 (two) times daily. 60 tablet 0  ° metFORMIN (GLUCOPHAGE-XR) 500 MG 24 hr tablet Take 4 tablets (2,000 mg total) by mouth daily. 360 tablet 3  °   metoprolol succinate (TOPROL-XL) 50 MG 24 hr tablet Take 1 tablet (50 mg total) by mouth daily. Take with or immediately following a meal. 90 tablet 2  ° omeprazole (PRILOSEC) 20 MG capsule Take 20 mg by mouth every morning.    ° Oxycodone HCl 10 MG TABS Take 10 mg by mouth as needed.    ° potassium chloride SA (KLOR-CON M) 20 MEQ tablet Take 2 tablets (40 mEq total) by mouth every morning AND 1 tablet (20 mEq total) every evening. 270 tablet 3  ° prazosin (MINIPRESS) 2 MG capsule TAKE TWO CAPSULES BY MOUTH EVERYDAY AT BEDTIME 180 capsule 3  ° pregabalin (LYRICA) 150 MG capsule Take 1 capsule (150 mg total) by mouth 3 (three) times daily. Please discuss future refills with your PCP. 270 capsule 1  ° repaglinide (PRANDIN) 2 MG tablet Take 2 tablets (4 mg total) by mouth 2 (two) times daily before a meal. 360 tablet 3  ° spironolactone (ALDACTONE) 25 MG tablet Take 1 tablet (25 mg total) by mouth daily. 90 tablet 2  ° Tiotropium Bromide-Olodaterol (STIOLTO RESPIMAT) 2.5-2.5 MCG/ACT AERS Inhale 2 puffs into the lungs daily. 1 each 3  ° torsemide (DEMADEX) 20 MG tablet Take 3 tablets (60 mg total) by mouth every morning AND 2 tablets (40 mg total) every evening. 450 tablet 3  ° traZODone (DESYREL) 100 MG tablet Take 200 mg by mouth at bedtime.    ° venlafaxine XR (EFFEXOR-XR) 75 MG 24 hr capsule Take 3 capsules (225 mg total) by mouth daily with breakfast. 270 capsule 3  ° temazepam (RESTORIL) 15 MG capsule TAKE ONE CAPSULE BY MOUTH EVERYDAY AT BEDTIME 30 capsule 1  ° °No current facility-administered medications for this encounter.  ° °BP 108/60    Pulse 81    Wt (!)  174.2 kg (384 lb)    SpO2 94%    BMI 49.30 kg/m²  °General: NAD, obese.  °Neck: Thick, JVP difficult, no thyromegaly or thyroid nodule.  °Lungs: Clear to auscultation bilaterally with normal respiratory effort. °CV: Nondisplaced PMI.  Heart regular S1/S2, no S3/S4, 1/6 SEM RUSB  Trace edema.  No carotid bruit.  Normal pedal pulses.  °Abdomen: Soft, nontender, no hepatosplenomegaly, no distention.  °Skin: Intact without lesions or rashes.  °Neurologic: Alert and oriented x 3.  °Psych: Normal affect. °Extremities: No clubbing or cyanosis.  °HEENT: Normal.  ° °Assessment/Plan: °1. Chronic diastolic CHF: Echo (11/22) with EF 60-65%, mild LVH, mildly dilated LV, moderate aortic stenosis (trileaflet aortic valve) with mean gradient 22 mmHg, DI 0.27.  Symptoms are confounded by morbid obesity.  Exam remains quite difficult for volume though weight is down about 2 lbs. He has had multiple admissions this year with CHF.  NYHA class IIIb symptoms chronically.  °- Continue torsemide 60 qam/40 qpm and KCl to 40 qam/20 qpm.  BMET/BNP today.   °- With difficult exam for volume and multiple admissions, I am going to arrange for RHC + Cardiomems placement to allow closer volume monitoring.  We discussed risks/benefits and he agrees to the procedure. He will hold Eliquis the day before and day of procedure.  Hopefully this can be arranged for next week.  °- Continue dapagliflozin 10 daily.  °- Continue spironolactone 25 daily. °2. Aortic stenosis: At least moderate AS on last echo though images difficult.  Murmur not impressive but he is obese so may not be well-heard.  Given significant symptoms (NYHA class IIIb), I will arrange for TEE to more closely assess the aortic   valve to see if AS is truly severe.  We discussed risks/benefits and he agrees to procedure. Will try to arrange this for the day of his cath.  °3. Atrial fibrillation: Paroxysmal. He is in NSR today.  °- Continue apixaban.  °- Continue Toprol XL and diltiazem CD.   °4. OSA/?OHS: Continue Bipap.  ° °Followup 1 month with APP.  ° °[facility] °08/20/2021 ° ° °

## 2021-08-20 NOTE — Progress Notes (Signed)
PCP: Wharton, Courtney, PA-C °Cardiology: Dr. Turner °HF Cardiology: Dr.  ° °66 y.o. with history of paroxysmal atrial fibrillation, OSA/?OHS, morbid obesity, and chronic diastolic CHF was referred by Dr. Turner for evaluation of CHF.  Patient has been admitted multiple times in the last year for CHF exacerbations.  He uses a walker around the house and a motorized scooter outside the house.   He had a negative cath in 2019 and a negative Cardiolite in 4/22.  Most recent echo in 11/22 was a difficult study but showed EF 60-65%, mild LVH, mildly dilated LV, moderate aortic stenosis (trileaflet aortic valve) with mean gradient 22 mmHg, DI 0.27.  ° °He was admitted in 12/22 with fever/PNA as well as chest pain (ruled out for MI).  He had atrial fibrillation/RVR this admission but converted back to NSR.  He was diuresed. Additionally, he was thought to have a partial seizure and Keppra was restarted.  ° °Patient returns today for followup of CHF.  Activity remains significantly limited.  He is short of breath walking across his house.  This is chronic. He has orthopnea.  He continues to have occasional episodes of atypical chest pain, no change from the past.  Worsening fatigue, thinks it may be due to Keppra.  He using Bipap nightly.  ° °ECG (personally reviewed): NSR, 1st degree AVB with PACs ° °Labs (11/22): K 3.7, creatinine 0.89 °Labs (1/23): K 3.7, creatinine 0.93, BNP 18 ° °PMH: °1. Type 2 diabetes °2. Atrial fibrillation: Paroxysmal.  °3. HTN °4. Hyperlipidemia °5. OSA, ?OHS: Uses Bipap.  °6. Obesity °7. SVT °8. Chest pain: LHC in 6/19 with minimal CAD.  °- Cardiolite (4/22): No ischemia.  °9. Depression/anxiety.  °10. Chronic diastolic CHF: Echo in 11/22 with EF 60-65%, mild LVH, mildly dilated LV, moderate aortic stenosis (trileaflet aortic valve) with mean gradient 22 mmHg, DI 0.27.  °11. Partial seizures ° °Social History  ° °Socioeconomic History  ° Marital status: Significant Other  °  Spouse name:  Janet Ayotte  ° Number of children: 1  ° Years of education: Not on file  ° Highest education level: Associate degree: academic program  °Occupational History  ° Occupation: retired  °  Comment: [employer]  °Tobacco Use  ° Smoking status: Former  °  Packs/day: 1.00  °  Years: 44.00  °  Pack years: 44.00  °  Types: Cigarettes  °  Quit date: 03/07/2016  °  Years since quitting: 5.4  °  Passive exposure: Past  ° Smokeless tobacco: Never  °Vaping Use  ° Vaping Use: Never used  °Substance and Sexual Activity  ° Alcohol use: Yes  °  Comment: rare  ° Drug use: No  ° Sexual activity: Not on file  °Other Topics Concern  ° Not on file  °Social History Narrative  ° Lives with wife Janet  ° Right Handed  ° Drinks 1-2 cups caffeine  ° °Social Determinants of Health  ° °Financial Resource Strain: Medium Risk  ° Difficulty of Paying Living Expenses: Somewhat hard  °Food Insecurity: Food Insecurity Present  ° Worried About Running Out of Food in the Last Year: Sometimes true  ° Ran Out of Food in the Last Year: Never true  °Transportation Needs: Unmet Transportation Needs  ° Lack of Transportation (Medical): Yes  ° Lack of Transportation (Non-Medical): No  °Physical Activity: Not on file  °Stress: Not on file  °Social Connections: Not on file  °Intimate Partner Violence: Not on file  ° °Family History  °  Problem Relation Age of Onset   CAD Maternal Grandfather    Diabetes Other    Diabetes Mellitus II Neg Hx    Colon cancer Neg Hx    Esophageal cancer Neg Hx    Inflammatory bowel disease Neg Hx    Liver disease Neg Hx    Pancreatic cancer Neg Hx    Rectal cancer Neg Hx    Stomach cancer Neg Hx    Sleep apnea Neg Hx    ROS: All systems reviewed and negative except as per HPI.   Current Outpatient Medications  Medication Sig Dispense Refill   Accu-Chek FastClix Lancets MISC 1 each by Other route in the morning and at bedtime.     acetaminophen (TYLENOL) 325 MG tablet Take 2 tablets (650 mg total) by mouth every  4 (four) hours as needed for mild pain (or temp > 37.5 C (99.5 F)).     albuterol (VENTOLIN HFA) 108 (90 Base) MCG/ACT inhaler Inhale 2 puffs into the lungs every 6 (six) hours as needed for wheezing or shortness of breath.     Alcohol Swabs (ALCOHOL PREP) 70 % PADS 1 each by Other route in the morning, at noon, and at bedtime.     allopurinol (ZYLOPRIM) 100 MG tablet Take 200 mg by mouth daily.     apixaban (ELIQUIS) 5 MG TABS tablet Take 1 tablet (5 mg total) by mouth 2 (two) times daily. 180 tablet 2   ARIPiprazole (ABILIFY) 5 MG tablet TAKE ONE TABLET BY MOUTH EVERYDAY AT BEDTIME 30 tablet 0   atorvastatin (LIPITOR) 40 MG tablet Take 1 tablet (40 mg total) by mouth every evening. TAKE ONE TABLET BY MOUTH DAILY AT 6PM 90 tablet 3   B-D ULTRAFINE III SHORT PEN 31G X 8 MM MISC 1 each by Other route See admin instructions. Use to inject insulin subcutaneously at bedtime as directed.     blood glucose meter kit and supplies Dispense based on patient and insurance preference. Use up to four times daily as directed. (FOR ICD-10 E10.9, E11.9). 1 each 0   busPIRone (BUSPAR) 5 MG tablet TAKE ONE TABLET BY MOUTH THREE TIMES DAILY 270 tablet 1   calcium carbonate (TUMS - DOSED IN MG ELEMENTAL CALCIUM) 500 MG chewable tablet Chew 1 tablet by mouth daily as needed for indigestion or heartburn.     cetirizine (ZYRTEC) 10 MG tablet Take 10 mg by mouth at bedtime.      Cholecalciferol (VITAMIN D3 PO) Take 2,000 Units by mouth daily.     dapagliflozin propanediol (FARXIGA) 10 MG TABS tablet Take 1 tablet (10 mg total) by mouth daily before breakfast. 90 tablet 3   diltiazem (CARDIZEM CD) 180 MG 24 hr capsule Take 1 capsule (180 mg total) by mouth daily. 90 capsule 3   Dulaglutide (TRULICITY) 4.5 AO/1.3YQ SOPN Inject 4.5 mg as directed once a week. 4.5 mL 3   ferrous sulfate 324 (65 Fe) MG TBEC Take 324 mg by mouth daily with breakfast.      fluticasone (FLONASE) 50 MCG/ACT nasal spray Place 2 sprays into both  nostrils daily.      isosorbide mononitrate (IMDUR) 30 MG 24 hr tablet Take 1 tablet (30 mg total) by mouth daily. 90 tablet 2   levETIRAcetam (KEPPRA) 1000 MG tablet Take 1 tablet (1,000 mg total) by mouth 2 (two) times daily. 60 tablet 0   metFORMIN (GLUCOPHAGE-XR) 500 MG 24 hr tablet Take 4 tablets (2,000 mg total) by mouth daily. 360 tablet 3  metoprolol succinate (TOPROL-XL) 50 MG 24 hr tablet Take 1 tablet (50 mg total) by mouth daily. Take with or immediately following a meal. 90 tablet 2   omeprazole (PRILOSEC) 20 MG capsule Take 20 mg by mouth every morning.     Oxycodone HCl 10 MG TABS Take 10 mg by mouth as needed.     potassium chloride SA (KLOR-CON M) 20 MEQ tablet Take 2 tablets (40 mEq total) by mouth every morning AND 1 tablet (20 mEq total) every evening. 270 tablet 3   prazosin (MINIPRESS) 2 MG capsule TAKE TWO CAPSULES BY MOUTH EVERYDAY AT BEDTIME 180 capsule 3   pregabalin (LYRICA) 150 MG capsule Take 1 capsule (150 mg total) by mouth 3 (three) times daily. Please discuss future refills with your PCP. 270 capsule 1   repaglinide (PRANDIN) 2 MG tablet Take 2 tablets (4 mg total) by mouth 2 (two) times daily before a meal. 360 tablet 3   spironolactone (ALDACTONE) 25 MG tablet Take 1 tablet (25 mg total) by mouth daily. 90 tablet 2   Tiotropium Bromide-Olodaterol (STIOLTO RESPIMAT) 2.5-2.5 MCG/ACT AERS Inhale 2 puffs into the lungs daily. 1 each 3   torsemide (DEMADEX) 20 MG tablet Take 3 tablets (60 mg total) by mouth every morning AND 2 tablets (40 mg total) every evening. 450 tablet 3   traZODone (DESYREL) 100 MG tablet Take 200 mg by mouth at bedtime.     venlafaxine XR (EFFEXOR-XR) 75 MG 24 hr capsule Take 3 capsules (225 mg total) by mouth daily with breakfast. 270 capsule 3   temazepam (RESTORIL) 15 MG capsule TAKE ONE CAPSULE BY MOUTH EVERYDAY AT BEDTIME 30 capsule 1   No current facility-administered medications for this encounter.   BP 108/60    Pulse 81    Wt (!)  174.2 kg (384 lb)    SpO2 94%    BMI 49.30 kg/m  General: NAD, obese.  Neck: Thick, JVP difficult, no thyromegaly or thyroid nodule.  Lungs: Clear to auscultation bilaterally with normal respiratory effort. CV: Nondisplaced PMI.  Heart regular S1/S2, no S3/S4, 1/6 SEM RUSB  Trace edema.  No carotid bruit.  Normal pedal pulses.  Abdomen: Soft, nontender, no hepatosplenomegaly, no distention.  Skin: Intact without lesions or rashes.  Neurologic: Alert and oriented x 3.  Psych: Normal affect. Extremities: No clubbing or cyanosis.  HEENT: Normal.   Assessment/Plan: 1. Chronic diastolic CHF: Echo (63/33) with EF 60-65%, mild LVH, mildly dilated LV, moderate aortic stenosis (trileaflet aortic valve) with mean gradient 22 mmHg, DI 0.27.  Symptoms are confounded by morbid obesity.  Exam remains quite difficult for volume though weight is down about 2 lbs. He has had multiple admissions this year with CHF.  NYHA class IIIb symptoms chronically.  - Continue torsemide 60 qam/40 qpm and KCl to 40 qam/20 qpm.  BMET/BNP today.   - With difficult exam for volume and multiple admissions, I am going to arrange for RHC + Cardiomems placement to allow closer volume monitoring.  We discussed risks/benefits and he agrees to the procedure. He will hold Eliquis the day before and day of procedure.  Hopefully this can be arranged for next week.  - Continue dapagliflozin 10 daily.  - Continue spironolactone 25 daily. 2. Aortic stenosis: At least moderate AS on last echo though images difficult.  Murmur not impressive but he is obese so may not be well-heard.  Given significant symptoms (NYHA class IIIb), I will arrange for TEE to more closely assess the aortic  valve to see if AS is truly severe.  We discussed risks/benefits and he agrees to procedure. Will try to arrange this for the day of his cath.  °3. Atrial fibrillation: Paroxysmal. He is in NSR today.  °- Continue apixaban.  °- Continue Toprol XL and diltiazem CD.   °4. OSA/?OHS: Continue Bipap.  ° °Followup 1 month with APP.  ° °  °08/20/2021 ° ° °

## 2021-08-23 ENCOUNTER — Other Ambulatory Visit: Payer: Self-pay | Admitting: Adult Health

## 2021-08-24 DIAGNOSIS — R6889 Other general symptoms and signs: Secondary | ICD-10-CM | POA: Diagnosis not present

## 2021-08-25 ENCOUNTER — Other Ambulatory Visit (HOSPITAL_COMMUNITY): Payer: Self-pay | Admitting: *Deleted

## 2021-08-25 DIAGNOSIS — I350 Nonrheumatic aortic (valve) stenosis: Secondary | ICD-10-CM

## 2021-08-25 DIAGNOSIS — I5032 Chronic diastolic (congestive) heart failure: Secondary | ICD-10-CM

## 2021-08-25 MED ORDER — SODIUM CHLORIDE 0.9% FLUSH: 3.0000 mL | Freq: Two times a day (BID) | INTRAVENOUS | Status: DC

## 2021-08-26 ENCOUNTER — Ambulatory Visit (HOSPITAL_BASED_OUTPATIENT_CLINIC_OR_DEPARTMENT_OTHER)
Admission: RE | Admit: 2021-08-26 | Discharge: 2021-08-26 | Disposition: A | Discharge status: Home / Self Care | Payer: Medicare HMO | Source: Ambulatory Visit | Attending: Cardiology | Admitting: Cardiology

## 2021-08-26 ENCOUNTER — Ambulatory Visit (HOSPITAL_COMMUNITY): Payer: Medicare HMO | Admitting: Certified Registered Nurse Anesthetist

## 2021-08-26 ENCOUNTER — Encounter (HOSPITAL_COMMUNITY)
Admission: RE | Disposition: A | Discharge status: Home / Self Care | Payer: Self-pay | Source: Home / Self Care | Attending: Cardiology

## 2021-08-26 ENCOUNTER — Encounter (HOSPITAL_COMMUNITY): Payer: Self-pay | Admitting: Cardiology

## 2021-08-26 ENCOUNTER — Ambulatory Visit: Payer: Medicare Other | Admitting: Adult Health

## 2021-08-26 ENCOUNTER — Other Ambulatory Visit: Payer: Self-pay

## 2021-08-26 ENCOUNTER — Ambulatory Visit (HOSPITAL_COMMUNITY)
Admission: RE | Admit: 2021-08-26 | Discharge: 2021-08-26 | Disposition: A | Discharge status: Home / Self Care | Payer: Medicare HMO | Attending: Cardiology | Admitting: Cardiology

## 2021-08-26 DIAGNOSIS — E119 Type 2 diabetes mellitus without complications: Secondary | ICD-10-CM | POA: Insufficient documentation

## 2021-08-26 DIAGNOSIS — I480 Paroxysmal atrial fibrillation: Secondary | ICD-10-CM | POA: Diagnosis not present

## 2021-08-26 DIAGNOSIS — I083 Combined rheumatic disorders of mitral, aortic and tricuspid valves: Secondary | ICD-10-CM | POA: Diagnosis not present

## 2021-08-26 DIAGNOSIS — Z7985 Long-term (current) use of injectable non-insulin antidiabetic drugs: Secondary | ICD-10-CM | POA: Insufficient documentation

## 2021-08-26 DIAGNOSIS — R6889 Other general symptoms and signs: Secondary | ICD-10-CM | POA: Diagnosis not present

## 2021-08-26 DIAGNOSIS — I2510 Atherosclerotic heart disease of native coronary artery without angina pectoris: Secondary | ICD-10-CM | POA: Diagnosis not present

## 2021-08-26 DIAGNOSIS — Z7901 Long term (current) use of anticoagulants: Secondary | ICD-10-CM | POA: Insufficient documentation

## 2021-08-26 DIAGNOSIS — I5032 Chronic diastolic (congestive) heart failure: Secondary | ICD-10-CM | POA: Diagnosis not present

## 2021-08-26 DIAGNOSIS — I350 Nonrheumatic aortic (valve) stenosis: Secondary | ICD-10-CM

## 2021-08-26 DIAGNOSIS — G4733 Obstructive sleep apnea (adult) (pediatric): Secondary | ICD-10-CM | POA: Diagnosis not present

## 2021-08-26 DIAGNOSIS — I509 Heart failure, unspecified: Secondary | ICD-10-CM | POA: Diagnosis not present

## 2021-08-26 DIAGNOSIS — Z794 Long term (current) use of insulin: Secondary | ICD-10-CM | POA: Diagnosis not present

## 2021-08-26 DIAGNOSIS — Z7984 Long term (current) use of oral hypoglycemic drugs: Secondary | ICD-10-CM | POA: Diagnosis not present

## 2021-08-26 DIAGNOSIS — I352 Nonrheumatic aortic (valve) stenosis with insufficiency: Secondary | ICD-10-CM | POA: Diagnosis not present

## 2021-08-26 DIAGNOSIS — Z79899 Other long term (current) drug therapy: Secondary | ICD-10-CM | POA: Insufficient documentation

## 2021-08-26 DIAGNOSIS — I272 Pulmonary hypertension, unspecified: Secondary | ICD-10-CM | POA: Diagnosis not present

## 2021-08-26 DIAGNOSIS — I110 Hypertensive heart disease with heart failure: Secondary | ICD-10-CM | POA: Diagnosis not present

## 2021-08-26 DIAGNOSIS — Z6841 Body Mass Index (BMI) 40.0 and over, adult: Secondary | ICD-10-CM | POA: Insufficient documentation

## 2021-08-26 HISTORY — PX: TEE WITHOUT CARDIOVERSION: SHX5443

## 2021-08-26 HISTORY — PX: RIGHT HEART CATH: CATH118263

## 2021-08-26 HISTORY — PX: PRESSURE SENSOR/CARDIOMEMS: CATH118258

## 2021-08-26 LAB — POCT I-STAT EG7
Acid-Base Excess: 5 mmol/L — ABNORMAL HIGH (ref 0.0–2.0)
Acid-Base Excess: 5 mmol/L — ABNORMAL HIGH (ref 0.0–2.0)
Bicarbonate: 30.8 mmol/L — ABNORMAL HIGH (ref 20.0–28.0)
Bicarbonate: 31.7 mmol/L — ABNORMAL HIGH (ref 20.0–28.0)
Calcium, Ion: 1.26 mmol/L (ref 1.15–1.40)
Calcium, Ion: 1.25 mmol/L (ref 1.15–1.40)
HCT: 39 % (ref 39.0–52.0)
HCT: 39 % (ref 39.0–52.0)
Hemoglobin: 13.3 g/dL (ref 13.0–17.0)
Hemoglobin: 13.3 g/dL (ref 13.0–17.0)
O2 Saturation: 67 %
O2 Saturation: 69 %
Potassium: 3.6 mmol/L (ref 3.5–5.1)
Potassium: 3.6 mmol/L (ref 3.5–5.1)
Sodium: 140 mmol/L (ref 135–145)
Sodium: 140 mmol/L (ref 135–145)
TCO2: 32 mmol/L (ref 22–32)
TCO2: 33 mmol/L — ABNORMAL HIGH (ref 22–32)
pCO2, Ven: 50.7 mmHg (ref 44.0–60.0)
pCO2, Ven: 51.8 mmHg (ref 44.0–60.0)
pH, Ven: 7.392 (ref 7.250–7.430)
pH, Ven: 7.394 (ref 7.250–7.430)
pO2, Ven: 36 mmHg (ref 32.0–45.0)
pO2, Ven: 37 mmHg (ref 32.0–45.0)

## 2021-08-26 LAB — ECHO TEE
AR max vel: 1.4 cm2
AV Area VTI: 1.35 cm2
AV Area mean vel: 1.37 cm2
AV Mean grad: 28 mmHg
AV Peak grad: 44 mmHg
Ao pk vel: 3.32 m/s

## 2021-08-26 LAB — GLUCOSE, CAPILLARY
Glucose-Capillary: 75 mg/dL (ref 70–99)
Glucose-Capillary: 116 mg/dL — ABNORMAL HIGH (ref 70–99)

## 2021-08-26 SURGERY — RIGHT HEART CATH
Anesthesia: LOCAL

## 2021-08-26 SURGERY — ECHOCARDIOGRAM, TRANSESOPHAGEAL
Anesthesia: Monitor Anesthesia Care

## 2021-08-26 MED ORDER — SODIUM CHLORIDE 0.9 % IV SOLN
INTRAVENOUS | Status: AC | PRN
  Administered 2021-08-26: 500 mL via INTRAVENOUS

## 2021-08-26 MED ORDER — LIDOCAINE 2% (20 MG/ML) 5 ML SYRINGE
INTRAMUSCULAR | Status: DC | PRN
  Administered 2021-08-26: 100 mg via INTRAVENOUS

## 2021-08-26 MED ORDER — MIDAZOLAM HCL 2 MG/2ML IJ SOLN
INTRAMUSCULAR | Status: AC
  Filled 2021-08-26: qty 2

## 2021-08-26 MED ORDER — LIDOCAINE HCL (PF) 1 % IJ SOLN
INTRAMUSCULAR | Status: DC | PRN
  Administered 2021-08-26: 10 mL

## 2021-08-26 MED ORDER — FENTANYL CITRATE (PF) 100 MCG/2ML IJ SOLN
INTRAMUSCULAR | Status: DC | PRN
  Administered 2021-08-26: 25 ug via INTRAVENOUS

## 2021-08-26 MED ORDER — PROPOFOL 10 MG/ML IV BOLUS
INTRAVENOUS | Status: DC | PRN
  Administered 2021-08-26: 20 mg via INTRAVENOUS

## 2021-08-26 MED ORDER — FENTANYL CITRATE (PF) 100 MCG/2ML IJ SOLN
INTRAMUSCULAR | Status: AC
  Filled 2021-08-26: qty 2

## 2021-08-26 MED ORDER — PROPOFOL 500 MG/50ML IV EMUL
INTRAVENOUS | Status: DC | PRN
  Administered 2021-08-26: 150 ug/kg/min via INTRAVENOUS

## 2021-08-26 MED ORDER — LABETALOL HCL 5 MG/ML IV SOLN: 10.0000 mg | INTRAVENOUS | Status: DC | PRN

## 2021-08-26 MED ORDER — MIDAZOLAM HCL 2 MG/2ML IJ SOLN
INTRAMUSCULAR | Status: DC | PRN
  Administered 2021-08-26: 1 mg via INTRAVENOUS

## 2021-08-26 MED ORDER — HEPARIN (PORCINE) IN NACL 1000-0.9 UT/500ML-% IV SOLN
INTRAVENOUS | Status: DC | PRN
  Administered 2021-08-26 (×2): 500 mL

## 2021-08-26 MED ORDER — ONDANSETRON HCL 4 MG/2ML IJ SOLN: 4.0000 mg | Freq: Four times a day (QID) | INTRAMUSCULAR | Status: DC | PRN

## 2021-08-26 MED ORDER — IOHEXOL 350 MG/ML SOLN
INTRAVENOUS | Status: DC | PRN
  Administered 2021-08-26: 10 mL

## 2021-08-26 MED ORDER — SODIUM CHLORIDE 0.9 % IV SOLN: 250.0000 mL | INTRAVENOUS | Status: DC | PRN

## 2021-08-26 MED ORDER — LIDOCAINE HCL (PF) 1 % IJ SOLN
INTRAMUSCULAR | Status: AC
  Filled 2021-08-26: qty 30

## 2021-08-26 MED ORDER — ACETAMINOPHEN 325 MG PO TABS: 650.0000 mg | ORAL_TABLET | ORAL | Status: DC | PRN

## 2021-08-26 MED ORDER — PHENYLEPHRINE 40 MCG/ML (10ML) SYRINGE FOR IV PUSH (FOR BLOOD PRESSURE SUPPORT)
PREFILLED_SYRINGE | INTRAVENOUS | Status: DC | PRN
  Administered 2021-08-26 (×3): 80 ug via INTRAVENOUS

## 2021-08-26 MED ORDER — POTASSIUM CHLORIDE CRYS ER 20 MEQ PO TBCR: 40.0000 meq | EXTENDED_RELEASE_TABLET | Freq: Two times a day (BID) | ORAL | 3 refills | Status: DC

## 2021-08-26 MED ORDER — HYDRALAZINE HCL 20 MG/ML IJ SOLN: 10.0000 mg | INTRAMUSCULAR | Status: DC | PRN

## 2021-08-26 MED ORDER — SODIUM CHLORIDE 0.9% FLUSH: 3.0000 mL | Freq: Two times a day (BID) | INTRAVENOUS | Status: DC

## 2021-08-26 MED ORDER — HEPARIN (PORCINE) IN NACL 1000-0.9 UT/500ML-% IV SOLN
INTRAVENOUS | Status: AC
  Filled 2021-08-26: qty 500

## 2021-08-26 MED ORDER — TORSEMIDE 20 MG PO TABS: ORAL_TABLET | ORAL | 3 refills | Status: DC

## 2021-08-26 MED ORDER — SODIUM CHLORIDE 0.9% FLUSH: 3.0000 mL | INTRAVENOUS | Status: DC | PRN

## 2021-08-26 MED ORDER — SODIUM CHLORIDE 0.9 % IV SOLN: INTRAVENOUS | Status: DC

## 2021-08-26 SURGICAL SUPPLY — 15 items
CARDIOMEMS PA SENSOR W/DELIVER (Prosthesis & Implant Heart) ×2 IMPLANT
CATH SWAN GANZ 7F STRAIGHT (CATHETERS) ×1 IMPLANT
DILATOR VESSEL 38 20CM 11FR (INTRODUCER) ×1 IMPLANT
DILATOR VESSEL 38 20CM 8FR (INTRODUCER) ×1 IMPLANT
DILATOR VESSEL 38 20CM 9FR (INTRODUCER) ×1 IMPLANT
GUIDEWIRE .025 260CM (WIRE) ×1 IMPLANT
KIT HEART LEFT (KITS) ×2 IMPLANT
PACK CARDIAC CATHETERIZATION (CUSTOM PROCEDURE TRAY) ×2 IMPLANT
SENSOR CARDIOMEMS PA W/DELIVER (Prosthesis & Implant Heart) IMPLANT
SHEATH FAST CATH 12F 12CM (SHEATH) ×1 IMPLANT
SHEATH PINNACLE 7F 10CM (SHEATH) ×1 IMPLANT
SHEATH PROBE COVER 6X72 (BAG) ×1 IMPLANT
TRANSDUCER W/STOPCOCK (MISCELLANEOUS) ×2 IMPLANT
WIRE EMERALD 3MM-J .035X150CM (WIRE) ×1 IMPLANT
WIRE NITREX .018X300 STIFF (WIRE) ×1 IMPLANT

## 2021-08-26 NOTE — Progress Notes (Deleted)
°  Echocardiogram 2D Echocardiogram has been performed.  Lawrence [name] 08/26/2021, 11:59 AM

## 2021-08-26 NOTE — Interval H&P Note (Signed)
History and Physical Interval Note:  08/26/2021 10:45 AM  Lawrence [name]  has presented today for surgery, with the diagnosis of aortic stenosis.  The various methods of treatment have been discussed with the patient and family. After consideration of risks, benefits and other options for treatment, the patient has consented to  Procedure(s): TRANSESOPHAGEAL ECHOCARDIOGRAM (TEE) (N/A) as a surgical intervention.  The patient's history has been reviewed, patient examined, no change in status, stable for surgery.  I have reviewed the patient's chart and labs.  Questions were answered to the patient's satisfaction.     [name] [employer]

## 2021-08-26 NOTE — Progress Notes (Signed)
Site area: rt groin venous sheath Site Prior to Removal:  Level 0 Pressure Applied For: 15 minutes Manual:   yes Patient Status During Pull:  stable Post Pull Site:  Level 0 Post Pull Instructions Given:  yes Post Pull Pulses Present: rt dp dopplered Dressing Applied:  gauze and tegaderm Bedrest begins @ 8241 Comments:

## 2021-08-26 NOTE — Transfer of Care (Addendum)
Immediate Anesthesia Transfer of Care Note  Patient: Lawrence [name]  Procedure(s) Performed: TRANSESOPHAGEAL ECHOCARDIOGRAM (TEE)  Patient Location: PACU and Endoscopy Unit  Anesthesia Type:MAC  Level of Consciousness: drowsy  Airway & Oxygen Therapy: Patient connected to face mask oxygen  Post-op Assessment: Report given to RN and Post -op Vital signs reviewed and stable  Post vital signs: Reviewed and stable  Last Vitals:  Vitals Value Taken Time  BP    Temp    Pulse    Resp    SpO2      Last Pain:  Vitals:   08/26/21 0942  TempSrc: Temporal  PainSc: 3          Complications: No notable events documented.

## 2021-08-26 NOTE — Interval H&P Note (Signed)
History and Physical Interval Note:  08/26/2021 1:12 PM  Trellis Moment  has presented today for surgery, with the diagnosis of aortic stenosis, CHF.  The various methods of treatment have been discussed with the patient and family. After consideration of risks, benefits and other options for treatment, the patient has consented to  Procedure(s): RIGHT HEART CATH (N/A) PRESSURE SENSOR/CARDIOMEMS (N/A) as a surgical intervention.  The patient's history has been reviewed, patient examined, no change in status, stable for surgery.  I have reviewed the patient's chart and labs.  Questions were answered to the patient's satisfaction.     Naylene Foell [employer]

## 2021-08-26 NOTE — Progress Notes (Signed)
°  Echocardiogram Echocardiogram Transesophageal has been performed.  Fidel Levy 08/26/2021, 12:00 PM

## 2021-08-26 NOTE — Anesthesia Postprocedure Evaluation (Signed)
Anesthesia Post Note  Patient: Lawrence [name]  Procedure(s) Performed: TRANSESOPHAGEAL ECHOCARDIOGRAM (TEE)     Patient location during evaluation: PACU Anesthesia Type: MAC Level of consciousness: awake and alert Pain management: pain level controlled Vital Signs Assessment: post-procedure vital signs reviewed and stable Respiratory status: spontaneous breathing and respiratory function stable Cardiovascular status: stable Postop Assessment: no apparent nausea or vomiting Anesthetic complications: no   No notable events documented.  Last Vitals:  Vitals:   08/26/21 1123 08/26/21 1132  BP: (!) 119/58 137/77  Pulse: 84 81  Resp: 12 20  Temp:    SpO2: 91% 92%    Last Pain:  Vitals:   08/26/21 1115  TempSrc: Oral  PainSc: 0-No pain                 Merlinda Frederick

## 2021-08-26 NOTE — Discharge Instructions (Signed)
1. Increase torsemide to 80 mg in the morning and 60 mg in the evening.  2. Increase potassium chloride to 40 mEq twice a day.  3. Restart Eliquis tomorrow morning.

## 2021-08-26 NOTE — CV Procedure (Signed)
Procedure: TEE  Sedation: Per anesthesiology  Indication: Aortic stenosis  Findings: Please see echo section for full report.  Aortic stenosis appears moderate.   Loralie Champagne 08/26/2021 11:04 AM

## 2021-08-26 NOTE — Anesthesia Preprocedure Evaluation (Addendum)
Anesthesia Evaluation   Patient awake    Reviewed: Allergy & Precautions, NPO status , Patient's Chart, lab work & pertinent test results  Airway Mallampati: III  TM Distance: >3 FB Neck ROM: Full   Comment: Large neck circumference Dental no notable dental hx.    Pulmonary asthma , sleep apnea and Continuous Positive Airway Pressure Ventilation , COPD, former smoker,    Pulmonary exam normal breath sounds clear to auscultation       Cardiovascular METS: 3 - Mets hypertension, Pt. on medications and Pt. on home beta blockers + CAD and +CHF  Normal cardiovascular exam+ Valvular Problems/Murmurs AS  Rhythm:Regular Rate:Normal  Sinus rhythm with 1st degree A-V block with Premature atrial complexes Low voltage QRS Borderline ECG When compared with ECG of 09-Aug-2021 10:26, HEART RATE DECREASED SINCE previous and PVCs absent Confirmed by Kirk Ruths [phone]) on 08/19/2021 3:43:08 PM   Neuro/Psych Seizures -,  PSYCHIATRIC DISORDERS Anxiety Depression  Neuromuscular disease (peripheral neuropathy)    GI/Hepatic   Endo/Other  diabetes, Type 2, Insulin Dependent  Renal/GU   negative genitourinary   Musculoskeletal  (+) Arthritis , Osteoarthritis,    Abdominal   Peds negative pediatric ROS (+)  Hematology  (+) anemia ,   Anesthesia Other Findings   Reproductive/Obstetrics                            Anesthesia Physical Anesthesia Plan  ASA: 4  Anesthesia Plan: MAC   Post-op Pain Management: Minimal or no pain anticipated   Induction: Intravenous  PONV Risk Score and Plan: 1 and Propofol infusion, Treatment may vary due to age or medical condition and TIVA  Airway Management Planned: Natural Airway, Simple Face Mask and Nasal Cannula  Additional Equipment:   Intra-op Plan:   Post-operative Plan:   Informed Consent: I have reviewed the patients History and Physical, chart, labs and  discussed the procedure including the risks, benefits and alternatives for the proposed anesthesia with the patient or authorized representative who has indicated his/her understanding and acceptance.     Dental advisory given  Plan Discussed with: CRNA, Anesthesiologist and Surgeon  Anesthesia Plan Comments: (Advised patient he might have some awareness if his O2 saturation were to drop and we needed to lighten his sedation. Backup plan GETA. Norton Blizzard, MD  )       Anesthesia Quick Evaluation

## 2021-08-27 ENCOUNTER — Encounter (HOSPITAL_COMMUNITY): Payer: Self-pay | Admitting: Cardiology

## 2021-08-29 ENCOUNTER — Encounter (HOSPITAL_COMMUNITY): Payer: Self-pay | Admitting: Cardiology

## 2021-08-30 ENCOUNTER — Encounter (HOSPITAL_COMMUNITY): Payer: Self-pay | Admitting: Cardiology

## 2021-08-31 ENCOUNTER — Telehealth (HOSPITAL_COMMUNITY): Payer: Self-pay | Admitting: *Deleted

## 2021-08-31 NOTE — Telephone Encounter (Signed)
Larey Dresser, MD  Scarlette Calico, RN; Harvie Junior, CMA; [facility] Triage Pool Please contact patient and remind him to:  1. Increase torsemide to 80 qam/60 qpm.  2. Increase KCl to 40 bid.  3. Restart Eliquis tomorrow morning.    Pt is aware and said he made all of the med changes on 08/27/21.

## 2021-09-02 ENCOUNTER — Encounter: Payer: Self-pay | Admitting: Endocrinology

## 2021-09-02 DIAGNOSIS — H5213 Myopia, bilateral: Secondary | ICD-10-CM | POA: Diagnosis not present

## 2021-09-06 ENCOUNTER — Telehealth (HOSPITAL_COMMUNITY): Payer: Self-pay | Admitting: *Deleted

## 2021-09-06 ENCOUNTER — Telehealth: Payer: Self-pay

## 2021-09-06 DIAGNOSIS — R6889 Other general symptoms and signs: Secondary | ICD-10-CM | POA: Diagnosis not present

## 2021-09-06 NOTE — Telephone Encounter (Signed)
The patient was returning someone call.

## 2021-09-06 NOTE — Telephone Encounter (Signed)
Pt left vm stating he can not get his cardio mems pillow to read. Pt called tech support and was told a rep would reach out to him. Pt said a rep never called him. Pt is very frustrated. Amy Clegg,NP aware and contacted rep they will follow up with patient.

## 2021-09-07 ENCOUNTER — Telehealth: Payer: Self-pay | Admitting: Neurology

## 2021-09-07 DIAGNOSIS — G4700 Insomnia, unspecified: Secondary | ICD-10-CM | POA: Diagnosis not present

## 2021-09-07 DIAGNOSIS — M1990 Unspecified osteoarthritis, unspecified site: Secondary | ICD-10-CM | POA: Diagnosis not present

## 2021-09-07 DIAGNOSIS — I5032 Chronic diastolic (congestive) heart failure: Secondary | ICD-10-CM | POA: Diagnosis not present

## 2021-09-07 DIAGNOSIS — F331 Major depressive disorder, recurrent, moderate: Secondary | ICD-10-CM | POA: Diagnosis not present

## 2021-09-07 DIAGNOSIS — I480 Paroxysmal atrial fibrillation: Secondary | ICD-10-CM | POA: Diagnosis not present

## 2021-09-07 DIAGNOSIS — E785 Hyperlipidemia, unspecified: Secondary | ICD-10-CM | POA: Diagnosis not present

## 2021-09-07 DIAGNOSIS — I10 Essential (primary) hypertension: Secondary | ICD-10-CM | POA: Diagnosis not present

## 2021-09-07 DIAGNOSIS — E1169 Type 2 diabetes mellitus with other specified complication: Secondary | ICD-10-CM | POA: Diagnosis not present

## 2021-09-07 DIAGNOSIS — J449 Chronic obstructive pulmonary disease, unspecified: Secondary | ICD-10-CM | POA: Diagnosis not present

## 2021-09-07 NOTE — Telephone Encounter (Signed)
Patient called in to schedule a follow up from when he was hospitalized at the beginning of January. He was a little disoriented so his wife ended up taking over. She stated he had a fever of 107 while in the hospital and ended up having multiple seizures. They upped his keppra to 2000 MG a day and he's now very out of it she said-confused, sleeps most of the day and they are very concerned. I went ahead and scheduled him for Friday, but they wanted to know if you had any recommendations in the mean time. She also asked about the results of his sleep study. She stated they haven't heard anything in regards to that and were hoping to get an update.

## 2021-09-07 NOTE — Telephone Encounter (Signed)
Thank you for adding the patient to my Friday schedule. I contacted him and advised him to continue Keppra as directed until we check a Keppra level. He is requesting an update regarding his sleep apnea test. There is a note from 11/1 detailing the conversation the team had with him re his sleep test result.   Dr. April Manson

## 2021-09-08 ENCOUNTER — Encounter: Payer: Self-pay | Admitting: *Deleted

## 2021-09-08 NOTE — Telephone Encounter (Addendum)
I called pt relayed the results of his HST. He had not read the results in mychart.  He was able to pull up as we were speaking:    Patient referred by Dr. April Manson, seen by me on 05/04/21, patient had a HST on 05/24/2021 for reevaluation of his obstructive sleep apnea.  He has been on BiPAP therapy.  Please remind patient that I would like to review his compliance data still since he did not bring his machine for the appointment in September.  Please remind him to bring by his BiPAP machine with a power cord so I can review the data from it. Please advise him that I will write for a new AutoPap machine, if he is eligible for new equipment.  Please also advise him that his sleep apnea is severe and his oxygen saturations are low at baseline, below 90% with an average of 87%, lowest of 73%.  This indicates significant oxygen desaturations.  I would advise him to seek evaluation through a pulmonologist for underlying lung disease.  His history less COPD as one of his diagnoses.  He may qualify for supplemental oxygen.  I do not see that he is followed by pulmonology through [facility], he may have another specialist outside of [facility].  Please advise patient to talk to his primary care provider about a referral to pulmonology if need be.   Please advise patient that once he gets his new AutoPap machine, he will need to be seen in follow-up within 3 months.  We may adjust his pressure depending on his current settings on his machine and his compliance report.   Please ask him if he has a preference for his DME company.  I believe he got his last machine through the [place].  He has seen Dr. Silas Flood thru [facility].  I relayed that order will be sent to [employer] for new machine.  He will bring machine BIPAP in Friday 09-10-2021 when he has appt with Dr. April Manson.  I will let [facility] know and they can get download from machine and this will go to Dr. Rexene Alberts to review.   He and his wife have to rely on transportation so it is  no easy for them to get here.  He verbalized understanding of plan.  He has used [place] in past but was ok to using [employer] (DME in [place]).  Order sent to [place].  Letter sent to pt.

## 2021-09-08 NOTE — Telephone Encounter (Signed)
Noted  

## 2021-09-08 NOTE — Telephone Encounter (Signed)
I sent a message with the results to the pt back in November. We will call the pt's wife today and review them with her.

## 2021-09-09 ENCOUNTER — Ambulatory Visit (INDEPENDENT_AMBULATORY_CARE_PROVIDER_SITE_OTHER): Payer: Medicare HMO | Admitting: Adult Health

## 2021-09-09 ENCOUNTER — Encounter: Payer: Self-pay | Admitting: Adult Health

## 2021-09-09 ENCOUNTER — Other Ambulatory Visit: Payer: Self-pay

## 2021-09-09 DIAGNOSIS — G4700 Insomnia, unspecified: Secondary | ICD-10-CM | POA: Diagnosis not present

## 2021-09-09 DIAGNOSIS — F411 Generalized anxiety disorder: Secondary | ICD-10-CM

## 2021-09-09 DIAGNOSIS — F331 Major depressive disorder, recurrent, moderate: Secondary | ICD-10-CM

## 2021-09-09 DIAGNOSIS — F4310 Post-traumatic stress disorder, unspecified: Secondary | ICD-10-CM | POA: Diagnosis not present

## 2021-09-09 DIAGNOSIS — F410 Panic disorder [episodic paroxysmal anxiety] without agoraphobia: Secondary | ICD-10-CM

## 2021-09-09 MED ORDER — VENLAFAXINE HCL ER 75 MG PO CP24: 225.0000 mg | ORAL_CAPSULE | Freq: Every day | ORAL | 3 refills | Status: DC

## 2021-09-09 MED ORDER — TEMAZEPAM 15 MG PO CAPS: ORAL_CAPSULE | ORAL | 2 refills | Status: DC

## 2021-09-09 MED ORDER — ARIPIPRAZOLE 5 MG PO TABS: ORAL_TABLET | ORAL | 3 refills | Status: DC

## 2021-09-09 MED ORDER — BUSPIRONE HCL 5 MG PO TABS: 5.0000 mg | ORAL_TABLET | Freq: Three times a day (TID) | ORAL | 3 refills | Status: DC

## 2021-09-09 MED ORDER — TRAZODONE HCL 100 MG PO TABS: 200.0000 mg | ORAL_TABLET | Freq: Every day | ORAL | 3 refills | Status: DC

## 2021-09-09 NOTE — Progress Notes (Signed)
Lawrence [name] [id number] [birth year] 66 y.o.  Subjective:   Patient ID:  Lawrence [name] is a 66 y.o. (DOB [birth year]) male.  Chief Complaint: No chief complaint on file.   HPI Lawrence [name] presents to the office today for follow-up of panic attacks, MDD, GAD, PTSD, and insomnia.  Previously followed by [place].  Describes mood today as "ok". Pleasant. Tearful a couple of times.  Recent hospitalization - illness - seizure disorder also diagnosed. Mood symptoms - reports some depression - gets down because he can't walk. Feels anxious and irritable. Denies recent panic attacks. Stating "I'm doing pretty good". Uses motorized scooter to aid in mobility. Uses walker at home. Feels like current medications are helpful. Stable interest and motivation. Taking medications as prescribed.  Energy levels vary - lower overall. Active, does not have a regular exercise routine.   Enjoys some usual interests and activities. Lives with significant other of 7 years. No family local. Has a good friend locally. Appetite fair. Weight loss 390 pounds - high protein, low carb diet. Sleeps well most nights. Averages 6 to 7 hours - doing quite well. Some daytime napping. Focus and concentration stable - "for the most part". Completing tasks. Managing aspects of household. Retired [name]. Disabled. Denies SI or HI.  Denies AH or VH.  Previous medication trials: Ambien   Flowsheet Row Admission (Discharged) from 08/26/2021 in [facility]ATH [facility] ED to Hosp-Admission (Discharged) from 08/07/2021 in [facility] ED from 07/20/2021 in [place] No Risk No Risk No Risk        Review of Systems:  Review of Systems  Musculoskeletal:  Negative for gait problem.  Neurological:  Negative for tremors.  Psychiatric/Behavioral:         Please refer to HPI   Medications: I have reviewed the patient's  current medications.  Current Outpatient Medications  Medication Sig Dispense Refill   Accu-Chek FastClix Lancets MISC 1 each by Other route in the morning and at bedtime.     acetaminophen (TYLENOL) 325 MG tablet Take 2 tablets (650 mg total) by mouth every 4 (four) hours as needed for mild pain (or temp > 37.5 C (99.5 F)).     albuterol (VENTOLIN HFA) 108 (90 Base) MCG/ACT inhaler Inhale 2 puffs into the lungs every 6 (six) hours as needed for wheezing or shortness of breath.     Alcohol Swabs (ALCOHOL PREP) 70 % PADS 1 each by Other route in the morning, at noon, and at bedtime.     allopurinol (ZYLOPRIM) 100 MG tablet Take 200 mg by mouth daily.     apixaban (ELIQUIS) 5 MG TABS tablet Take 1 tablet (5 mg total) by mouth 2 (two) times daily. 180 tablet 2   ARIPiprazole (ABILIFY) 5 MG tablet TAKE ONE TABLET BY MOUTH EVERYDAY AT BEDTIME 90 tablet 3   atorvastatin (LIPITOR) 40 MG tablet Take 1 tablet (40 mg total) by mouth every evening. TAKE ONE TABLET BY MOUTH DAILY AT 6PM 90 tablet 3   B-D ULTRAFINE III SHORT PEN 31G X 8 MM MISC 1 each by Other route See admin instructions. Use to inject insulin subcutaneously at bedtime as directed.     blood glucose meter kit and supplies Dispense based on patient and insurance preference. Use up to four times daily as directed. (FOR ICD-10 E10.9, E11.9). 1 each 0   busPIRone (BUSPAR) 5 MG tablet Take 1  tablet (5 mg total) by mouth 3 (three) times daily. 270 tablet 3   calcium carbonate (TUMS - DOSED IN MG ELEMENTAL CALCIUM) 500 MG chewable tablet Chew 1 tablet by mouth daily as needed for indigestion or heartburn.     cetirizine (ZYRTEC) 10 MG tablet Take 10 mg by mouth at bedtime.      Cholecalciferol (VITAMIN D3 PO) Take 2,000 Units by mouth daily.     dapagliflozin propanediol (FARXIGA) 10 MG TABS tablet Take 1 tablet (10 mg total) by mouth daily before breakfast. 90 tablet 3   diltiazem (CARDIZEM CD) 180 MG 24 hr capsule Take 1 capsule (180 mg total) by  mouth daily. 90 capsule 3   Dulaglutide (TRULICITY) 4.5 TS/1.7BL SOPN Inject 4.5 mg as directed once a week. 4.5 mL 3   ferrous sulfate 324 (65 Fe) MG TBEC Take 324 mg by mouth daily with breakfast.      fluticasone (FLONASE) 50 MCG/ACT nasal spray Place 2 sprays into both nostrils daily.      isosorbide mononitrate (IMDUR) 30 MG 24 hr tablet Take 1 tablet (30 mg total) by mouth daily. 90 tablet 2   levETIRAcetam (KEPPRA) 1000 MG tablet Take 1 tablet (1,000 mg total) by mouth 2 (two) times daily. 60 tablet 0   metFORMIN (GLUCOPHAGE-XR) 500 MG 24 hr tablet Take 4 tablets (2,000 mg total) by mouth daily. 360 tablet 3   metoprolol succinate (TOPROL-XL) 50 MG 24 hr tablet Take 1 tablet (50 mg total) by mouth daily. Take with or immediately following a meal. 90 tablet 2   omeprazole (PRILOSEC) 20 MG capsule Take 20 mg by mouth every morning.     Oxycodone HCl 10 MG TABS Take 10 mg by mouth as needed.     potassium chloride SA (KLOR-CON M) 20 MEQ tablet Take 2 tablets (40 mEq total) by mouth 2 (two) times daily. 270 tablet 3   prazosin (MINIPRESS) 2 MG capsule TAKE TWO CAPSULES BY MOUTH EVERYDAY AT BEDTIME 180 capsule 3   pregabalin (LYRICA) 150 MG capsule Take 1 capsule (150 mg total) by mouth 3 (three) times daily. Please discuss future refills with your PCP. 270 capsule 1   repaglinide (PRANDIN) 2 MG tablet Take 2 tablets (4 mg total) by mouth 2 (two) times daily before a meal. 360 tablet 3   spironolactone (ALDACTONE) 25 MG tablet Take 1 tablet (25 mg total) by mouth daily. 90 tablet 2   temazepam (RESTORIL) 15 MG capsule TAKE ONE CAPSULE BY MOUTH EVERYDAY AT BEDTIME 30 capsule 2   Tiotropium Bromide-Olodaterol (STIOLTO RESPIMAT) 2.5-2.5 MCG/ACT AERS Inhale 2 puffs into the lungs daily. 1 each 3   torsemide (DEMADEX) 20 MG tablet Take 4 tablets (80 mg total) by mouth every morning AND 3 tablets (60 mg total) every evening. 450 tablet 3   traZODone (DESYREL) 100 MG tablet Take 2 tablets (200 mg total)  by mouth at bedtime. Take 200 mg by mouth at bedtime. 180 tablet 3   venlafaxine XR (EFFEXOR-XR) 75 MG 24 hr capsule Take 3 capsules (225 mg total) by mouth daily with breakfast. 270 capsule 3   No current facility-administered medications for this visit.    Medication Side Effects: None  Allergies:  Allergies  Allergen Reactions   Vancomycin Other (See Comments)    "Red Man Syndrome" 02/02/17: possible cause for rash under both arms   Niacin Other (See Comments)   Niacin And Related Other (See Comments)    Red man syndrome   Tubersol [  Tuberculin, Ppd] Other (See Comments)    Reaction unknown   Doxycycline Rash and Other (See Comments)    Past Medical History:  Diagnosis Date   Acquired dilation of ascending aorta and aortic root (HCC)    53mm by echo 01/2021   Adenomatous colon polyp 2007   Anemia    Anxiety    Aortic stenosis    moderate AS by echo 01/2021   Asthma    BPH without obstruction/lower urinary tract symptoms 02/22/2017   Chronic diastolic (congestive) heart failure (HCC)    Chronic venous stasis 03/07/2019   COPD (chronic obstructive pulmonary disease) (HCC)    Coronary artery calcification seen on CAT scan    Depression    Diabetic neuropathy (HCC) 09/11/2019   History of colon polyps 08/24/2018   Hypertension    Morbid obesity (HCC)    OSA (obstructive sleep apnea)    Pain due to onychomycosis of toenails of both feet 09/11/2019   Peripheral neuropathy 02/22/2017   Primary osteoarthritis, left shoulder 03/05/2017   PTSD (post-traumatic stress disorder)    Pure hypercholesterolemia    QT prolongation 03/07/2019   Seizures (HCC)    Shortness of breath    Sinus tachycardia 03/07/2019   Sleep apnea    CPAP   Type 2 diabetes mellitus with vascular disease (HCC) 09/11/2019    Past Medical History, Surgical history, Social history, and Family history were reviewed and updated as appropriate.   Please see review of systems for further details on the  patient's review from today.   Objective:   Physical Exam:  There were no vitals taken for this visit.  Physical Exam Constitutional:      General: He is not in acute distress. Musculoskeletal:        General: No deformity.  Neurological:     Mental Status: He is alert and oriented to person, place, and time.     Coordination: Coordination normal.  Psychiatric:        Attention and Perception: Attention and perception normal. He does not perceive auditory or visual hallucinations.        Mood and Affect: Mood normal. Mood is not anxious or depressed. Affect is not labile, blunt, angry or inappropriate.        Speech: Speech normal.        Behavior: Behavior normal.        Thought Content: Thought content normal. Thought content is not paranoid or delusional. Thought content does not include homicidal or suicidal ideation. Thought content does not include homicidal or suicidal plan.        Cognition and Memory: Cognition and memory normal.        Judgment: Judgment normal.     Comments: Insight intact    Lab Review:     Component Value Date/Time   NA 140 08/26/2021 1337   NA 140 08/26/2021 1337   NA 136 01/07/2021 0000   K 3.6 08/26/2021 1337   K 3.6 08/26/2021 1337   CL 98 08/19/2021 1300   CO2 28 08/19/2021 1300   GLUCOSE 114 (H) 08/19/2021 1300   BUN 18 08/19/2021 1300   BUN 22 01/07/2021 0000   CREATININE 0.84 08/19/2021 1300   CALCIUM 9.5 08/19/2021 1300   PROT 7.0 08/12/2021 0125   ALBUMIN 3.1 (L) 08/12/2021 0125   AST 22 08/12/2021 0125   ALT 35 08/12/2021 0125   ALKPHOS 63 08/12/2021 0125   BILITOT 0.7 08/12/2021 0125   GFRNONAA >60 08/19/2021 1300  GFRAA 77 05/20/2020 1038       Component Value Date/Time   WBC 11.6 (H) 08/19/2021 1300   RBC 4.72 08/19/2021 1300   HGB 13.3 08/26/2021 1337   HGB 13.3 08/26/2021 1337   HCT 39.0 08/26/2021 1337   HCT 39.0 08/26/2021 1337   PLT 354 08/19/2021 1300   MCV 90.7 08/19/2021 1300   MCH 28.8 08/19/2021 1300    MCHC 31.8 08/19/2021 1300   RDW 14.8 08/19/2021 1300   LYMPHSABS 1.9 08/12/2021 0125   MONOABS 0.8 08/12/2021 0125   EOSABS 0.5 08/12/2021 0125   BASOSABS 0.1 08/12/2021 0125    No results found for: POCLITH, LITHIUM   No results found for: PHENYTOIN, PHENOBARB, VALPROATE, CBMZ   .res Assessment: Plan:    Plan:  PDMP reviewed  1. Prazosin $RemoveBef'2mg'vIrMgRxkUI$  - 2 tabs at hs 2. Temazepam $RemoveBefo'15mg'PbOilRwiCHf$  at bedtime 3. Abilify $RemoveBe'5mg'zDQUDcWWu$  daily 4. Trazadone $RemoveBefo'100mg'BZDRSfImFQA$  - 2 at bedtime 5. Effexor XR $RemoveBe'75mg'aRIPyJXlL$  - 3 every morning 6. Buspar $RemoveB'5mg'zebwwpTc$  TID   [place] - pain management  RTC 3 months  Patient advised to contact office with any questions, adverse effects, or acute worsening in signs and symptoms.  Discussed potential metabolic side effects associated with atypical antipsychotics, as well as potential risk for movement side effects. Advised pt to contact office if movement side effects occur.   Diagnoses and all orders for this visit:  Generalized anxiety disorder -     ARIPiprazole (ABILIFY) 5 MG tablet; TAKE ONE TABLET BY MOUTH EVERYDAY AT BEDTIME -     busPIRone (BUSPAR) 5 MG tablet; Take 1 tablet (5 mg total) by mouth 3 (three) times daily. -     venlafaxine XR (EFFEXOR-XR) 75 MG 24 hr capsule; Take 3 capsules (225 mg total) by mouth daily with breakfast.  Panic attacks -     busPIRone (BUSPAR) 5 MG tablet; Take 1 tablet (5 mg total) by mouth 3 (three) times daily.  Insomnia, unspecified type -     temazepam (RESTORIL) 15 MG capsule; TAKE ONE CAPSULE BY MOUTH EVERYDAY AT BEDTIME -     traZODone (DESYREL) 100 MG tablet; Take 2 tablets (200 mg total) by mouth at bedtime. Take 200 mg by mouth at bedtime.  PTSD (post-traumatic stress disorder) -     venlafaxine XR (EFFEXOR-XR) 75 MG 24 hr capsule; Take 3 capsules (225 mg total) by mouth daily with breakfast.  Major depressive disorder, recurrent episode, moderate (HCC) -     ARIPiprazole (ABILIFY) 5 MG tablet; TAKE ONE TABLET BY MOUTH EVERYDAY  AT BEDTIME -     venlafaxine XR (EFFEXOR-XR) 75 MG 24 hr capsule; Take 3 capsules (225 mg total) by mouth daily with breakfast.     Please see After Visit Summary for patient specific instructions.  Future Appointments  Date Time Provider [place]  09/10/2021 10:45 AM Alric Ran, MD [facility] None  09/23/2021 11:30 AM [facility] PA/NP [facility] None  10/06/2021  9:30 AM Gardiner Barefoot, DPM [facility]  10/14/2021  7:45 AM Renato Shin, MD [facility] None  10/18/2021  1:30 PM Hunsucker, Bonna Gains, MD [facility] None  11/25/2021  9:45 AM Star Age, MD [facility] None    No orders of the defined types were placed in this encounter.   -------------------------------

## 2021-09-10 ENCOUNTER — Ambulatory Visit (INDEPENDENT_AMBULATORY_CARE_PROVIDER_SITE_OTHER): Payer: Medicare HMO | Admitting: Neurology

## 2021-09-10 ENCOUNTER — Encounter: Payer: Self-pay | Admitting: Neurology

## 2021-09-10 VITALS — BP 122/64 | HR 64 | Ht 74.0 in | Wt 376.0 lb

## 2021-09-10 DIAGNOSIS — Z6841 Body Mass Index (BMI) 40.0 and over, adult: Secondary | ICD-10-CM

## 2021-09-10 DIAGNOSIS — G40909 Epilepsy, unspecified, not intractable, without status epilepticus: Secondary | ICD-10-CM

## 2021-09-10 DIAGNOSIS — Z79899 Other long term (current) drug therapy: Secondary | ICD-10-CM

## 2021-09-10 DIAGNOSIS — E1142 Type 2 diabetes mellitus with diabetic polyneuropathy: Secondary | ICD-10-CM | POA: Diagnosis not present

## 2021-09-10 DIAGNOSIS — G4733 Obstructive sleep apnea (adult) (pediatric): Secondary | ICD-10-CM

## 2021-09-10 DIAGNOSIS — G4719 Other hypersomnia: Secondary | ICD-10-CM

## 2021-09-10 MED ORDER — LEVETIRACETAM ER 1000 MG PO TB24: 1000.0000 mg | ORAL_TABLET | Freq: Every evening | ORAL | 4 refills | Status: DC

## 2021-09-10 NOTE — Progress Notes (Signed)
[facility]  PATIENT: Lawrence [name] DOB: 08-Feb-1956  REFERRING CLINICIAN: [name], PA-C HISTORY FROM: Patient and sister Harriette Ohara FOR VISIT: [facility] follow up   HISTORICAL  CHIEF COMPLAINT:  Chief Complaint  Patient presents with   Follow-up    RM 13 with sister taylor here for hospital f/u- Pt reports since d/c his fatigue levels have been worse. Feels like this could be r/t to the Keppra dosage (1000 mg bid). Reports he feels groggy at times as well.    INTERVAL HISTORY 09/10/2021:  Patient presents today for follow-up with Remonia Richter, last visit was in September at that time he did stop his Keppra and did not have any jerking motion of the right arm therefore plan was to observe patient off [place].  Since then he has been doing fine until December 31 when he presented to the hospital due to altered mental status.  He was admitted for Toxic metabolic encephalopathy, severe sepsis in the setting of pneumonia and acute hypoxic respiratory failure.  During this time he was noted to have right arm jerking and period of unresponsiveness.  He had a routine EEG which did not show any epileptiform discharge and no seizures.  Plan was to restart the patient on Keppra but 1000 mg twice daily.  Since discharge from the hospital patient has been reported increased daytime somnolence.  He reported he is very difficult for him to for him to stay up, he felt like a zombie and also have issue with his memory.  He still dealing with peripheral neuropathy and is on Lyrica 150 mg 3 times daily and also has severe sleep apnea.  No seizure-like activity since being home.    HISTORY OF PRESENT ILLNESS:  This is a 66 year old man with past medical history of obesity, obstructive sleep apnea, diastolic heart failure, hypertension, hyperlipidemia, diabetes who is presenting with seizure-like activity.  Patient stated that he was admitted in March of this year for  pulmonary edema, acute hypoxic respiratory failure, and left lower extremity cellulitis.  During his admission he was noted to have seizure-like activity.  Wife who is present today mentioned that patient started having tremor like activity in the upper extremity lasted about 30 seconds.  He only had 1 episode.  He was evaluated for seizure-like activity.  He had MRI which was negative and EEG also which was negative.  He was started on Keppra 1000 mg twice daily.  Patient stated after discharge from the hospital he took the medication for 1 month and self discontinued.  Since leaving the hospital he has not had any seizure-like activity.  Denies any previous seizure.  Denies any family history of seizures, denies any seizures factor.  His MRI was negative for any acute stroke or chronic stroke .   Patient also report continued lower extremity pain and numbness.  He has a history of diabetic neuropathy and he is on pregabalin 100 mg 3 times daily.  His hemoglobin A1c is still elevated, last hemoglobin A1c was 8.  He is on both insulin and oral meds for his diabetes. Other issues discussed today he is asleep.  He had he does have a diagnosis of obstructive sleep apnea he is on a CPAP machine, reported he take he uses CPAP machine but still has been getting restful sleep..  The patient reported last time he has a steep CPAP machine checked was about 3 years ago.  He has not seen a sleep doctor since  Handedness: Right handed   Seizure Type: Unclear,   Current frequency: Only once   Any injuries from seizures:   Seizure risk factors: No seizure risk factor  Previous ASMs: Levetiracetam   Currenty ASMs: Levetiracetam   ASMs side effects: None   Brain Images: Normal MRI   Previous EEGs: Normal EEG   OTHER MEDICAL CONDITIONS: Obesity, OSA, HTN, DM, HLD, COPD, Diabetic neuropathy, Diastolic heart failure   REVIEW OF SYSTEMS: Full 14 system review of systems performed and negative with  exception of: as noted in the HPI  ALLERGIES: Allergies  Allergen Reactions   Vancomycin Other (See Comments)    "Red Man Syndrome" 02/02/17: possible cause for rash under both arms   Niacin Other (See Comments)   Niacin And Related Other (See Comments)    Red man syndrome   Tubersol [Tuberculin, Ppd] Other (See Comments)    Reaction unknown   Doxycycline Rash and Other (See Comments)    HOME MEDICATIONS: Outpatient Medications Prior to Visit  Medication Sig Dispense Refill   Accu-Chek FastClix Lancets MISC 1 each by Other route in the morning and at bedtime.     acetaminophen (TYLENOL) 325 MG tablet Take 2 tablets (650 mg total) by mouth every 4 (four) hours as needed for mild pain (or temp > 37.5 C (99.5 F)).     albuterol (VENTOLIN HFA) 108 (90 Base) MCG/ACT inhaler Inhale 2 puffs into the lungs every 6 (six) hours as needed for wheezing or shortness of breath.     Alcohol Swabs (ALCOHOL PREP) 70 % PADS 1 each by Other route in the morning, at noon, and at bedtime.     allopurinol (ZYLOPRIM) 100 MG tablet Take 200 mg by mouth daily.     apixaban (ELIQUIS) 5 MG TABS tablet Take 1 tablet (5 mg total) by mouth 2 (two) times daily. 180 tablet 2   ARIPiprazole (ABILIFY) 5 MG tablet TAKE ONE TABLET BY MOUTH EVERYDAY AT BEDTIME 90 tablet 3   atorvastatin (LIPITOR) 40 MG tablet Take 1 tablet (40 mg total) by mouth every evening. TAKE ONE TABLET BY MOUTH DAILY AT 6PM 90 tablet 3   B-D ULTRAFINE III SHORT PEN 31G X 8 MM MISC 1 each by Other route See admin instructions. Use to inject insulin subcutaneously at bedtime as directed.     blood glucose meter kit and supplies Dispense based on patient and insurance preference. Use up to four times daily as directed. (FOR ICD-10 E10.9, E11.9). 1 each 0   busPIRone (BUSPAR) 5 MG tablet Take 1 tablet (5 mg total) by mouth 3 (three) times daily. 270 tablet 3   calcium carbonate (TUMS - DOSED IN MG ELEMENTAL CALCIUM) 500 MG chewable tablet Chew 1 tablet  by mouth daily as needed for indigestion or heartburn.     cetirizine (ZYRTEC) 10 MG tablet Take 10 mg by mouth at bedtime.      Cholecalciferol (VITAMIN D3 PO) Take 2,000 Units by mouth daily.     dapagliflozin propanediol (FARXIGA) 10 MG TABS tablet Take 1 tablet (10 mg total) by mouth daily before breakfast. 90 tablet 3   diltiazem (CARDIZEM CD) 180 MG 24 hr capsule Take 1 capsule (180 mg total) by mouth daily. 90 capsule 3   Dulaglutide (TRULICITY) 4.5 MA/0.0KH SOPN Inject 4.5 mg as directed once a week. 4.5 mL 3   ferrous sulfate 324 (65 Fe) MG TBEC Take 324 mg by mouth daily with breakfast.      fluticasone (FLONASE) 50 MCG/ACT  nasal spray Place 2 sprays into both nostrils daily.      isosorbide mononitrate (IMDUR) 30 MG 24 hr tablet Take 1 tablet (30 mg total) by mouth daily. 90 tablet 2   metFORMIN (GLUCOPHAGE-XR) 500 MG 24 hr tablet Take 4 tablets (2,000 mg total) by mouth daily. 360 tablet 3   metoprolol succinate (TOPROL-XL) 50 MG 24 hr tablet Take 1 tablet (50 mg total) by mouth daily. Take with or immediately following a meal. 90 tablet 2   omeprazole (PRILOSEC) 20 MG capsule Take 20 mg by mouth every morning.     Oxycodone HCl 10 MG TABS Take 10 mg by mouth as needed.     potassium chloride SA (KLOR-CON M) 20 MEQ tablet Take 2 tablets (40 mEq total) by mouth 2 (two) times daily. 270 tablet 3   prazosin (MINIPRESS) 2 MG capsule TAKE TWO CAPSULES BY MOUTH EVERYDAY AT BEDTIME 180 capsule 3   pregabalin (LYRICA) 150 MG capsule Take 1 capsule (150 mg total) by mouth 3 (three) times daily. Please discuss future refills with your PCP. 270 capsule 1   repaglinide (PRANDIN) 2 MG tablet Take 2 tablets (4 mg total) by mouth 2 (two) times daily before a meal. 360 tablet 3   spironolactone (ALDACTONE) 25 MG tablet Take 1 tablet (25 mg total) by mouth daily. 90 tablet 2   temazepam (RESTORIL) 15 MG capsule TAKE ONE CAPSULE BY MOUTH EVERYDAY AT BEDTIME 30 capsule 2   Tiotropium Bromide-Olodaterol  (STIOLTO RESPIMAT) 2.5-2.5 MCG/ACT AERS Inhale 2 puffs into the lungs daily. 1 each 3   torsemide (DEMADEX) 20 MG tablet Take 4 tablets (80 mg total) by mouth every morning AND 3 tablets (60 mg total) every evening. 450 tablet 3   traZODone (DESYREL) 100 MG tablet Take 2 tablets (200 mg total) by mouth at bedtime. Take 200 mg by mouth at bedtime. 180 tablet 3   venlafaxine XR (EFFEXOR-XR) 75 MG 24 hr capsule Take 3 capsules (225 mg total) by mouth daily with breakfast. 270 capsule 3   levETIRAcetam (KEPPRA) 1000 MG tablet Take 1 tablet (1,000 mg total) by mouth 2 (two) times daily. 60 tablet 0   No facility-administered medications prior to visit.    PAST MEDICAL HISTORY: Past Medical History:  Diagnosis Date   Acquired dilation of ascending aorta and aortic root ([place])    82mm by echo 01/2021   Adenomatous colon polyp 2007   Anemia    Anxiety    Aortic stenosis    moderate AS by echo 01/2021   Asthma    BPH without obstruction/lower urinary tract symptoms 02/22/2017   Chronic diastolic (congestive) heart failure (HCC)    Chronic venous stasis 03/07/2019   COPD (chronic obstructive pulmonary disease) (HCC)    Coronary artery calcification seen on CAT scan    Depression    Diabetic neuropathy ([place]) 09/11/2019   History of colon polyps 08/24/2018   Hypertension    Morbid obesity (HCC)    OSA (obstructive sleep apnea)    Pain due to onychomycosis of toenails of both feet 09/11/2019   Peripheral neuropathy 02/22/2017   Primary osteoarthritis, left shoulder 03/05/2017   PTSD (post-traumatic stress disorder)    Pure hypercholesterolemia    QT prolongation 03/07/2019   Seizures (HCC)    Shortness of breath    Sinus tachycardia 03/07/2019   Sleep apnea    CPAP   Type 2 diabetes mellitus with vascular disease ([place]) 09/11/2019    PAST SURGICAL HISTORY: Past Surgical  History:  Procedure Laterality Date   ENDOVENOUS ABLATION SAPHENOUS VEIN W/ LASER Right 08/20/2020   endovenous  laser ablation right greater saphenous vein by Gae Gallop MD    JOINT REPLACEMENT     left knee replacement x 2   KNEE ARTHROSCOPY Bilateral    LEFT HEART CATH AND CORONARY ANGIOGRAPHY N/A 01/17/2018   Procedure: LEFT HEART CATH AND CORONARY ANGIOGRAPHY;  Surgeon: Leonie Man, MD;  Location: [place] [facility];  Service: Cardiovascular;  Laterality: N/A;   PRESSURE SENSOR/CARDIOMEMS N/A 08/26/2021   Procedure: PRESSURE SENSOR/CARDIOMEMS;  Surgeon: Larey Dresser, MD;  Location: [place] [facility];  Service: Cardiovascular;  Laterality: N/A;   RIGHT HEART CATH N/A 08/26/2021   Procedure: RIGHT HEART CATH;  Surgeon: Larey Dresser, MD;  Location: [place] [facility];  Service: Cardiovascular;  Laterality: N/A;   TEE WITHOUT CARDIOVERSION N/A 08/26/2021   Procedure: TRANSESOPHAGEAL ECHOCARDIOGRAM (TEE);  Surgeon: Larey Dresser, MD;  Location: [facility] ENDOS[facility];  Service: Cardiovascular;  Laterality: N/A;   UMBILICAL HERNIA REPAIR      FAMILY HISTORY: Family History  Problem Relation Age of Onset   CAD Maternal Grandfather    Diabetes Other    Diabetes Mellitus II Neg Hx    Colon cancer Neg Hx    Esophageal cancer Neg Hx    Inflammatory bowel disease Neg Hx    Liver disease Neg Hx    Pancreatic cancer Neg Hx    Rectal cancer Neg Hx    Stomach cancer Neg Hx    Sleep apnea Neg Hx     SOCIAL HISTORY: Social History   Socioeconomic History   Marital status: Significant Other    Spouse name: [name]   Number of children: 1   Years of education: Not on file   Highest education level: Associate degree: academic program  Occupational History   Occupation: retired    Comment: Artist  Tobacco Use   Smoking status: Former    Packs/day: 1.00    Years: 44.00    Pack years: 44.00    Types: Cigarettes    Quit date: 03/07/2016    Years since quitting: 5.5    Passive exposure: Past   Smokeless tobacco: Never  Vaping Use   Vaping Use: Never used  Substance and  Sexual Activity   Alcohol use: Yes    Comment: rare   Drug use: No   Sexual activity: Not on file  Other Topics Concern   Not on file  Social History Narrative   Lives with wife [name]   Right Handed   Drinks 1-2 cups caffeine   Social Determinants of Health   Financial Resource Strain: Medium Risk   Difficulty of Paying Living Expenses: Somewhat hard  Food Insecurity: Food Insecurity Present   Worried About Charity fundraiser in the Last Year: Sometimes true   Ran Out of Food in the Last Year: Never true  Transportation Needs: Unmet Transportation Needs   Lack of Transportation (Medical): Yes   Lack of Transportation (Non-Medical): No  Physical Activity: Not on file  Stress: Not on file  Social Connections: Not on file  Intimate Partner Violence: Not on file     PHYSICAL EXAM  GENERAL EXAM/CONSTITUTIONAL: Vitals:  Vitals:   09/10/21 1048  BP: 122/64  Pulse: 64  Weight: (!) 376 lb (170.6 kg)  Height: $Remove'6\' 2"'gWreoVs$  (1.88 m)   Body mass index is 48.28 kg/m. Wt Readings from Last 3 Encounters:  09/10/21 [name])  376 lb (170.6 kg)  08/26/21 (!) 384 lb 0.7 oz (174.2 kg)  08/19/21 (!) 384 lb (174.2 kg)   Patient is in no distress; well developed, nourished and groomed; neck is supple. He is morbidly obese  CARDIOVASCULAR: Examination of carotid arteries is normal; no carotid bruits Regular rate and rhythm, no murmurs Examination of peripheral vascular system by observation and palpation is normal  EYES: Pupils round and reactive to light, Visual fields full to confrontation, Extraocular movements intacts,   MUSCULOSKELETAL: Gait, strength, tone, movements noted in Neurologic exam below  NEUROLOGIC: MENTAL STATUS:  awake, alert, oriented to person, place and time recent and remote memory intact normal attention and concentration language fluent, comprehension intact, naming intact fund of knowledge appropriate  CRANIAL NERVE:  2nd, 3rd, 4th, 6th - pupils equal and  reactive to light, visual fields full to confrontation, extraocular muscles intact, no nystagmus 5th - facial sensation symmetric 7th - facial strength symmetric 8th - hearing intact 9th - palate elevates symmetrically, uvula midline 11th - shoulder shrug symmetric 12th - tongue protrusion midline  MOTOR:  normal bulk and tone, full strength in the BUE  SENSORY:  Decrease sensation to light touch, pinprick, and vibration up to knee bilaterally.   COORDINATION:  finger-nose-finger, fine finger movements normal   GAIT/STATION:  Deferred     DIAGNOSTIC DATA (LABS, IMAGING, TESTING) - I reviewed patient records, labs, notes, testing and imaging myself where available.  Lab Results  Component Value Date   WBC 11.6 (H) 08/19/2021   HGB 13.3 08/26/2021   HGB 13.3 08/26/2021   HCT 39.0 08/26/2021   HCT 39.0 08/26/2021   MCV 90.7 08/19/2021   PLT 354 08/19/2021      Component Value Date/Time   NA 140 08/26/2021 1337   NA 140 08/26/2021 1337   NA 136 01/07/2021 0000   K 3.6 08/26/2021 1337   K 3.6 08/26/2021 1337   CL 98 08/19/2021 1300   CO2 28 08/19/2021 1300   GLUCOSE 114 (H) 08/19/2021 1300   BUN 18 08/19/2021 1300   BUN 22 01/07/2021 0000   CREATININE 0.84 08/19/2021 1300   CALCIUM 9.5 08/19/2021 1300   PROT 7.0 08/12/2021 0125   ALBUMIN 3.1 (L) 08/12/2021 0125   AST 22 08/12/2021 0125   ALT 35 08/12/2021 0125   ALKPHOS 63 08/12/2021 0125   BILITOT 0.7 08/12/2021 0125   GFRNONAA >60 08/19/2021 1300   GFRAA 77 05/20/2020 1038   Lab Results  Component Value Date   CHOL 148 04/28/2021   HDL 30 (L) 04/28/2021   LDLCALC 69 04/28/2021   TRIG 246 (H) 04/28/2021   Lab Results  Component Value Date   HGBA1C 7.1 (A) 07/15/2021   Lab Results  Component Value Date   VITAMINB12 160 (L) 08/11/2021   Lab Results  Component Value Date   TSH 1.442 02/09/2021    MRI Brain 02/08/2021 No evidence of recent infarction, hemorrhage, or mass. No significant change  since recent prior study.  EEG 01/26/2021: Normal routine EEG  EEG 04/27/2021: This study is within normal limits. No seizures or epileptiform discharges were seen throughout the recording. If suspicion for ictal-interictal activity remains a concern, a prolonged study can  be considered.   EEG 08/09/2021: This study is within normal limits. No seizures or epileptiform discharges were seen throughout the recording.   I personally reviewed brain Images and previous EEG reports.   ASSESSMENT AND PLAN  66 y.o. year old male  with morbid obesity, hypertension,  hyperlipidemia, diabetes mellitus type 2, diabetes neuropathy, diastolic heart failure, seizure disorder who is presenting with sister for follow up after hospitalization.  At last visit in September plan was to hold antiseizure medication and to continue to monitor patient since he has been doing well off medication for 6 months.  Since then he had 2 hospitalizations.  The first one was on September 21 when he presented with strokelike symptoms.  At that time, he was noted to have right arm jerking like movement therefore he had a EEG which was negative, no epileptiform discharge and patient was not restarted on antiseizure medication.  He presented again on December 31 and admitted for severe sepsis in the setting of pneumonia and acute hypoxic respiratory failure.  During this hospitalization also he was noted to have right arm jerking, there was concern for seizures, he did have a EEG which was negative and he was started on Keppra, and this time the dose was increased to 1000 mg twice daily.  He has not had any additional movement, seizure since then but there is concern of worsening excessive daytime sleepiness.   At baseline he did have daytime sleepiness, he has severe obstructive sleep apnea, he is on BiPAP, but still will wake up multiple times during the night and having difficulty falling asleep, he has diabetic neuropathy is on Lyrica  currently is on 150 mg 3 times daily. Although his conditions and medications can put the patient at risk for daytime sleepiness, he noted that since the increase the Keppra to 1000 mg twice daily it got worse. He is also complaining of memory problem, being more forgetful.  Since patient did well on a total daily dose of 1000 mg I will put him back on 1000 mg daily.  I will prescribe him the extended release version and I have told him that if he does have another seizure then I will not increase his Keppra but I will start him on a another antiseizure medication, likely Trileptal since he he has some depression and anxiety.  I will also check a Keppra level today and I will see him in 6 months for follow-up.    1. Seizure disorder ([place])   2. OSA treated with BiPAP   3. Excessive daytime sleepiness   4. Morbid obesity with BMI of 45.0-49.9, adult ([place])   5. Polypharmacy   6. Diabetic peripheral neuropathy [facility])      Patient Instructions  Decrease Keppra to 1000 mg XR daily.  Patient did well while he was on Keppra 500 mg twice daily.  Since Keppra increased 1000 mg twice daily he is reporting excessive daytime sleepiness. Continue your other medication Follow-up with pulmonology regarding the obstructive sleep apnea and the new BiPAP machine Follow-up in 32-months, return sooner if worse    Per [facility] statutes, patients with seizures are not allowed to drive until they have been seizure-free for six months.  Other recommendations include using caution when using heavy equipment or power tools. Avoid working on ladders or at heights. Take showers instead of baths.  Do not swim alone.  Ensure the water temperature is not too high on the home water heater. Do not go swimming alone. Do not lock yourself in a room alone (i.e. bathroom). When caring for infants or small children, sit down when holding, feeding, or changing them to minimize risk of injury to the child in the event you have  a seizure. Maintain good sleep hygiene. Avoid alcohol.  Also  recommend adequate sleep, hydration, good diet and minimize stress.   During the Seizure  - First, ensure adequate ventilation and place patients on the floor on their left side  Loosen clothing around the neck and ensure the airway is patent. If the patient is clenching the teeth, do not force the mouth open with any object as this can cause severe damage - Remove all items from the surrounding that can be hazardous. The patient may be oblivious to what's happening and may not even know what he or she is doing. If the patient is confused and wandering, either gently guide him/her away and block access to outside areas - Reassure the individual and be comforting - Call 911. In most cases, the seizure ends before EMS arrives. However, there are cases when seizures may last over 3 to 5 minutes. Or the individual may have developed breathing difficulties or severe injuries. If a pregnant patient or a person with diabetes develops a seizure, it is prudent to call an ambulance. - Finally, if the patient does not regain full consciousness, then call EMS. Most patients will remain confused for about 45 to 90 minutes after a seizure, so you must use judgment in calling for help. - Avoid restraints but make sure the patient is in a bed with padded side rails - Place the individual in a lateral position with the neck slightly flexed; this will help the saliva drain from the mouth and prevent the tongue from falling backward - Remove all nearby furniture and other hazards from the area - Provide verbal assurance as the individual is regaining consciousness - Provide the patient with privacy if possible - Call for help and start treatment as ordered by the caregiver   After the Seizure (Postictal Stage)  After a seizure, most patients experience confusion, fatigue, muscle pain and/or a headache. Thus, one should permit the individual to sleep. For  the next few days, reassurance is essential. Being calm and helping reorient the person is also of importance.  Most seizures are painless and end spontaneously. Seizures are not harmful to others but can lead to complications such as stress on the lungs, brain and the heart. Individuals with prior lung problems may develop labored breathing and respiratory distress.     Orders Placed This Encounter  Procedures   Levetiracetam level     Meds ordered this encounter  Medications   levETIRAcetam ER 1000 MG TB24    Sig: Take 1,000 mg by mouth at bedtime.    Dispense:  90 tablet    Refill:  4    Return in about 6 months (around 03/17/2022).    Alric Ran, MD 09/10/2021, 12:36 PM  [facility] [address] [place],  [address] [phone]

## 2021-09-10 NOTE — Patient Instructions (Signed)
Decrease Keppra to 1000 mg XR daily.  Patient did well while he was on Keppra 500 mg twice daily.  Since Keppra increased 1000 mg twice daily he is reporting excessive daytime sleepiness. Continue your other medication Follow-up with pulmonology regarding the obstructive sleep apnea and the new BiPAP machine Follow-up in 4-months, return sooner if worse

## 2021-09-13 ENCOUNTER — Telehealth: Payer: Self-pay

## 2021-09-13 NOTE — Telephone Encounter (Signed)
I have submitted a PA request for Elepsia XR 1000mg  ER Tablets to [facility], [facility] - PA Case ID: [id number].   Awaiting determination from [facility].

## 2021-09-13 NOTE — Telephone Encounter (Signed)
Denyse Amass, RN; Vanessa Ralphs got it      Previous Messages   ----- Message -----  From: Brandon Melnick, RN  Sent: 09/08/2021   3:00 PM EST  To: Vanessa Ralphs, Marchelle Gearing, *  Subject: new autopap                                     New order in [place] from 05/2021 for autopap.    [name]"  Male, 66 y.o., 1956-07-28  MRN:  [id number]  Phone:  [phone]   [facility]

## 2021-09-14 ENCOUNTER — Telehealth: Payer: Self-pay

## 2021-09-14 ENCOUNTER — Other Ambulatory Visit: Payer: Self-pay | Admitting: Neurology

## 2021-09-14 LAB — LEVETIRACETAM LEVEL: Levetiracetam Lvl: 23.2 ug/mL (ref 10.0–40.0)

## 2021-09-14 MED ORDER — LEVETIRACETAM 500 MG PO TABS: 500.0000 mg | ORAL_TABLET | Freq: Two times a day (BID) | ORAL | 11 refills | Status: DC

## 2021-09-14 NOTE — Telephone Encounter (Signed)
Patient has pharmacy benefits through a [facility] plan. They have denied Karie Kirks, stating that the drug manufacturer must participate in the [facility] Coverage Gap Discount Program for coverage. They will not consider an appeal for this reason.

## 2021-09-14 NOTE — Telephone Encounter (Signed)
Please call and inform patient that I have switched him back to Keppra 500 mg twice daily because the extended released version was not covered by insurance.   Thank you

## 2021-09-14 NOTE — Telephone Encounter (Signed)
° °  Pre-operative Risk Assessment    Patient Name: Lawrence [name]  DOB: 12-29-1955 MRN: [id number]      Request for Surgical Clearance    Procedure:   SPINAL CORD STIMULATOR TRIAL  Date of Surgery:  Clearance TBD                                 Surgeon:  DR. Sherlyn Lees Surgeon's Group or Practice Name:  [facility] Phone number:  [phone] Fax number:  [phone]   Type of Clearance Requested:   - Pharmacy:  Hold Apixaban (Eliquis) 3 DAYS PRIOR AND 5 DAYS AFTER THE PROCEDURE    Type of Anesthesia:  Not Indicated   Additional requests/questions:    SignedJacinta Shoe   09/14/2021, 3:44 PM

## 2021-09-14 NOTE — Telephone Encounter (Signed)
Called and spoke with pt. Relayed message from Dr. April Manson. He will continue Keppra 500 po BID. Aware refills sent into [facility] for him. He will call if he has any questions moving forward.

## 2021-09-15 NOTE — Telephone Encounter (Signed)
° °  Patient Name: Lawrence [name]  DOB: Feb 11, 1956 MRN: [id number]  Primary Cardiologist: Fransico Him, MD / [facility]  Chart reviewed as part of pre-operative protocol coverage. Patient has history of PAF, OSA/OHS, morbid obesity, chronic diastolic CHF and aortic stenosis and has recently been undergoing active workup with the [facility] clinic for his CHF and aortic stenosis. Cardiomems was deployed 08/26/21. Has a follow-up OV on 09/23/21. I will route to Dr. Aundra Dubin for input on whether patient can undergo spinal cord stimulator trial procedure during which he'll need to hold his Eliquis, or if they would prefer to see him back in clinic first before making final recommendations. Once Dr. Claris Gladden reply is seen, can involve pharm team for input on Eliquis.   Charlie Pitter, PA-C 09/15/2021, 9:11 AM

## 2021-09-16 DIAGNOSIS — I5032 Chronic diastolic (congestive) heart failure: Secondary | ICD-10-CM | POA: Diagnosis not present

## 2021-09-16 DIAGNOSIS — E785 Hyperlipidemia, unspecified: Secondary | ICD-10-CM | POA: Diagnosis not present

## 2021-09-16 DIAGNOSIS — I480 Paroxysmal atrial fibrillation: Secondary | ICD-10-CM | POA: Diagnosis not present

## 2021-09-16 DIAGNOSIS — E1169 Type 2 diabetes mellitus with other specified complication: Secondary | ICD-10-CM | POA: Diagnosis not present

## 2021-09-16 DIAGNOSIS — I10 Essential (primary) hypertension: Secondary | ICD-10-CM | POA: Diagnosis not present

## 2021-09-16 DIAGNOSIS — F331 Major depressive disorder, recurrent, moderate: Secondary | ICD-10-CM | POA: Diagnosis not present

## 2021-09-16 DIAGNOSIS — J449 Chronic obstructive pulmonary disease, unspecified: Secondary | ICD-10-CM | POA: Diagnosis not present

## 2021-09-16 NOTE — Telephone Encounter (Signed)
He can hold Eliquis as requested for procedure.

## 2021-09-17 DIAGNOSIS — I10 Essential (primary) hypertension: Secondary | ICD-10-CM | POA: Diagnosis not present

## 2021-09-17 DIAGNOSIS — E1165 Type 2 diabetes mellitus with hyperglycemia: Secondary | ICD-10-CM | POA: Diagnosis not present

## 2021-09-17 DIAGNOSIS — F331 Major depressive disorder, recurrent, moderate: Secondary | ICD-10-CM | POA: Diagnosis not present

## 2021-09-17 DIAGNOSIS — I480 Paroxysmal atrial fibrillation: Secondary | ICD-10-CM | POA: Diagnosis not present

## 2021-09-17 DIAGNOSIS — G4700 Insomnia, unspecified: Secondary | ICD-10-CM | POA: Diagnosis not present

## 2021-09-17 DIAGNOSIS — E785 Hyperlipidemia, unspecified: Secondary | ICD-10-CM | POA: Diagnosis not present

## 2021-09-20 NOTE — Telephone Encounter (Signed)
Will route to pharm for input on anticoag.

## 2021-09-21 DIAGNOSIS — Z79891 Long term (current) use of opiate analgesic: Secondary | ICD-10-CM | POA: Diagnosis not present

## 2021-09-21 DIAGNOSIS — M792 Neuralgia and neuritis, unspecified: Secondary | ICD-10-CM | POA: Diagnosis not present

## 2021-09-21 DIAGNOSIS — R6889 Other general symptoms and signs: Secondary | ICD-10-CM | POA: Diagnosis not present

## 2021-09-21 DIAGNOSIS — M160 Bilateral primary osteoarthritis of hip: Secondary | ICD-10-CM | POA: Diagnosis not present

## 2021-09-21 DIAGNOSIS — E1142 Type 2 diabetes mellitus with diabetic polyneuropathy: Secondary | ICD-10-CM | POA: Diagnosis not present

## 2021-09-21 NOTE — Telephone Encounter (Signed)
Patient with diagnosis of atrial fibrillation on Eliquis for anticoagulation.    Procedure: spinal cord stimulator trial Date of procedure: TBD   CHA2DS2-VASc Score = 4   This indicates a 4.8% annual risk of stroke. The patient's score is based upon: CHF History: 1 HTN History: 1 Diabetes History: 1 Stroke History: 0 Vascular Disease History: 1 Age Score: 0 Gender Score: 0   Patient chart indicates hospitalization 6/22 for possible TIA.  No definitive diagnosis and not mentioned at follow up with neurology.  Did not include in score above  CrCl 148 Platelet count 354  Request is to hold for 3 days prior as well as 5 days after.  Based on above information would agree to 3 day prior hold, but our protocol does not cover a 9 day total hold.  Will route to MD for final clearance.

## 2021-09-21 NOTE — Telephone Encounter (Signed)
° ° °  Patient Name: Lawrence [name]  DOB: 1955-12-04 MRN: [id number]  Primary Cardiologist: Fransico Him, MD  Requested received for pharmacy input on holding Eliquis prior to and after surgical procedure. Per pharmacy and MD recommendations patient instructed  to hold Eliquis for 3 days prior and 5 days afterwards for a total of 9 days. Patient has follow up with CHF clinic on 09/23/21 and any addition preoperative recommendations can be made at that time if needed.  I will route this recommendation to the requesting party via Epic fax function and remove from pre-op pool.  Please call with questions.  Mable Fill, Marissa Nestle, NP 09/21/2021, 8:45 AM

## 2021-09-22 NOTE — Progress Notes (Signed)
PCP: [name], PA-C Cardiology: Dr. Mayford Knife HF Cardiology: Dr. Shirlee Latch  66 y.o. with history of paroxysmal atrial fibrillation, OSA/?OHS, morbid obesity, and chronic diastolic CHF . Admitted multiple times in the last year for CHF exacerbations. He had a negative cath in 2019 and a negative Cardiolite in 4/22.  Most recent echo in 11/22 was a difficult study but showed EF 60-65%, mild LVH, mildly dilated LV, moderate aortic stenosis (trileaflet aortic valve) with mean gradient 22 mmHg, DI 0.27.   Underwent RHC with [facility]om[facility] . Elevated filling pressures and preserved cardiac output. Has had difficulty with cardiomems readings.    Presents for heart failure follow up with his girlfriend. Overall feeling fine. Remains SOB with exertion but  he tells me this is his baseline. Denies PND/Orthopnea. Uses Bipap nightly. Appetite ok. Follows low salt diet. Does endorse drinking extra fluids. No fever or chills. Weight at home 374-376 pounds. Limited by back pain. Using motorized scooter when he leaves the home. Uses rolling walker in the house. [facility] following for PT. Taking all medications.  Labs (11/22): K 3.7, creatinine 0.89 Labs (1/23): K 3.7, creatinine 0.93, BNP 18  PMH: 1. Type 2 diabetes 2. Atrial fibrillation: Paroxysmal.  3. HTN 4. Hyperlipidemia 5. OSA, ?OHS: Uses Bipap.  6. Obesity 7. SVT 8. Chest pain: LHC in 6/19 with minimal CAD.  - Cardiolite (4/22): No ischemia.  9. Depression/anxiety.  10. Chronic diastolic CHF: Echo in 11/22 with EF 60-65%, mild LVH, mildly dilated LV, moderate aortic stenosis (trileaflet aortic valve) with mean gradient 22 mmHg, DI 0.27.  11. Partial seizures 12. RHC 08/2021 RA mean 16, RV 47/18, PA 49/24, mean 34,PCWP mean 16, PA 68%, AO 91%, CO 8.9, CI 3.1 PVR 2 WU 13. 08/2021 TEE - moderate aortic stenosis.   Social History   Socioeconomic History   Marital status: Significant Other    Spouse name: [name]   Number of children: 1    Years of education: Not on file   Highest education level: Associate degree: academic program  Occupational History   Occupation: retired    Comment: Building control surveyor  Tobacco Use   Smoking status: Former    Packs/day: 1.00    Years: 44.00    Pack years: 44.00    Types: Cigarettes    Quit date: 03/07/2016    Years since quitting: 5.5    Passive exposure: Past   Smokeless tobacco: Never  Vaping Use   Vaping Use: Never used  Substance and Sexual Activity   Alcohol use: Yes    Comment: rare   Drug use: No   Sexual activity: Not on file  Other Topics Concern   Not on file  Social History Narrative   Lives with wife [name]   Right Handed   Drinks 1-2 cups caffeine   Social Determinants of Health   Financial Resource Strain: Medium Risk   Difficulty of Paying Living Expenses: Somewhat hard  Food Insecurity: Food Insecurity Present   Worried About Programme researcher, broadcasting/film/video in the Last Year: Sometimes true   Ran Out of Food in the Last Year: Never true  Transportation Needs: Unmet Transportation Needs   Lack of Transportation (Medical): Yes   Lack of Transportation (Non-Medical): No  Physical Activity: Not on file  Stress: Not on file  Social Connections: Not on file  Intimate Partner Violence: Not on file   Family History  Problem Relation Age of Onset   CAD Maternal Grandfather    Diabetes Other  Diabetes Mellitus II Neg Hx    Colon cancer Neg Hx    Esophageal cancer Neg Hx    Inflammatory bowel disease Neg Hx    Liver disease Neg Hx    Pancreatic cancer Neg Hx    Rectal cancer Neg Hx    Stomach cancer Neg Hx    Sleep apnea Neg Hx    ROS: All systems reviewed and negative except as per HPI.   Current Outpatient Medications  Medication Sig Dispense Refill   Accu-Chek FastClix Lancets MISC 1 each by Other route in the morning and at bedtime.     acetaminophen (TYLENOL) 325 MG tablet Take 2 tablets (650 mg total) by mouth every 4 (four) hours as needed for mild pain  (or temp > 37.5 C (99.5 F)).     albuterol (VENTOLIN HFA) 108 (90 Base) MCG/ACT inhaler Inhale 2 puffs into the lungs every 6 (six) hours as needed for wheezing or shortness of breath.     Alcohol Swabs (ALCOHOL PREP) 70 % PADS 1 each by Other route in the morning, at noon, and at bedtime.     allopurinol (ZYLOPRIM) 100 MG tablet Take 200 mg by mouth daily.     apixaban (ELIQUIS) 5 MG TABS tablet Take 1 tablet (5 mg total) by mouth 2 (two) times daily. 180 tablet 2   ARIPiprazole (ABILIFY) 5 MG tablet TAKE ONE TABLET BY MOUTH EVERYDAY AT BEDTIME 90 tablet 3   atorvastatin (LIPITOR) 40 MG tablet Take 1 tablet (40 mg total) by mouth every evening. TAKE ONE TABLET BY MOUTH DAILY AT 6PM 90 tablet 3   B-D ULTRAFINE III SHORT PEN 31G X 8 MM MISC 1 each by Other route See admin instructions. Use to inject insulin subcutaneously at bedtime as directed.     blood glucose meter kit and supplies Dispense based on patient and insurance preference. Use up to four times daily as directed. (FOR ICD-10 E10.9, E11.9). 1 each 0   busPIRone (BUSPAR) 5 MG tablet Take 1 tablet (5 mg total) by mouth 3 (three) times daily. 270 tablet 3   calcium carbonate (TUMS - DOSED IN MG ELEMENTAL CALCIUM) 500 MG chewable tablet Chew 1 tablet by mouth daily as needed for indigestion or heartburn.     cetirizine (ZYRTEC) 10 MG tablet Take 10 mg by mouth at bedtime.      Cholecalciferol (VITAMIN D3 PO) Take 2,000 Units by mouth daily.     dapagliflozin propanediol (FARXIGA) 10 MG TABS tablet Take 1 tablet (10 mg total) by mouth daily before breakfast. 90 tablet 3   diltiazem (CARDIZEM CD) 180 MG 24 hr capsule Take 1 capsule (180 mg total) by mouth daily. 90 capsule 3   Dulaglutide (TRULICITY) 4.5 LT/5.3UY SOPN Inject 4.5 mg as directed once a week. 4.5 mL 3   ferrous sulfate 324 (65 Fe) MG TBEC Take 324 mg by mouth daily with breakfast.      fluticasone (FLONASE) 50 MCG/ACT nasal spray Place 2 sprays into both nostrils daily.       isosorbide mononitrate (IMDUR) 30 MG 24 hr tablet Take 1 tablet (30 mg total) by mouth daily. 90 tablet 2   levETIRAcetam (KEPPRA) 500 MG tablet Take 1 tablet (500 mg total) by mouth 2 (two) times daily. 120 tablet 11   metFORMIN (GLUCOPHAGE-XR) 500 MG 24 hr tablet Take 4 tablets (2,000 mg total) by mouth daily. 360 tablet 3   metoprolol succinate (TOPROL-XL) 50 MG 24 hr tablet Take 1 tablet (50  mg total) by mouth daily. Take with or immediately following a meal. 90 tablet 2   omeprazole (PRILOSEC) 20 MG capsule Take 20 mg by mouth every morning.     Oxycodone HCl 10 MG TABS Take 10 mg by mouth as needed.     potassium chloride SA (KLOR-CON M) 20 MEQ tablet Take 2 tablets (40 mEq total) by mouth 2 (two) times daily. 270 tablet 3   prazosin (MINIPRESS) 2 MG capsule TAKE TWO CAPSULES BY MOUTH EVERYDAY AT BEDTIME 180 capsule 3   pregabalin (LYRICA) 150 MG capsule Take 1 capsule (150 mg total) by mouth 3 (three) times daily. Please discuss future refills with your PCP. 270 capsule 1   repaglinide (PRANDIN) 2 MG tablet Take 2 tablets (4 mg total) by mouth 2 (two) times daily before a meal. 360 tablet 3   spironolactone (ALDACTONE) 25 MG tablet Take 1 tablet (25 mg total) by mouth daily. 90 tablet 2   temazepam (RESTORIL) 15 MG capsule TAKE ONE CAPSULE BY MOUTH EVERYDAY AT BEDTIME 30 capsule 2   Tiotropium Bromide-Olodaterol (STIOLTO RESPIMAT) 2.5-2.5 MCG/ACT AERS Inhale 2 puffs into the lungs daily. 1 each 3   torsemide (DEMADEX) 20 MG tablet Take 4 tablets (80 mg total) by mouth every morning AND 3 tablets (60 mg total) every evening. 450 tablet 3   traZODone (DESYREL) 100 MG tablet Take 2 tablets (200 mg total) by mouth at bedtime. Take 200 mg by mouth at bedtime. 180 tablet 3   venlafaxine XR (EFFEXOR-XR) 75 MG 24 hr capsule Take 3 capsules (225 mg total) by mouth daily with breakfast. 270 capsule 3   No current facility-administered medications for this encounter.   BP 110/78    Pulse 85    Ht 6'  2" (1.88 m)    Wt (!) 172.4 kg (380 lb)    SpO2 95%    BMI 48.79 kg/m  Wt Readings from Last 3 Encounters:  09/23/21 (!) 172.4 kg (380 lb)  09/10/21 (!) 170.6 kg (376 lb)  08/26/21 (!) 174.2 kg (384 lb 0.7 oz)    General:  Arrived in motorized wheel chair. No resp difficulty HEENT: normal Neck: supple. no JVD. Carotids 2+ bilat; no bruits. No lymphadenopathy or thryomegaly appreciated. Cor: PMI nondisplaced. Regular rate & rhythm. No rubs, gallops. 2/6 RUSB Lungs: clear Abdomen: obese. soft, nontender, nondistended. No hepatosplenomegaly. No bruits or masses. Good bowel sounds. Extremities: no cyanosis, clubbing, rash, trace lower extremity edema Neuro: alert & orientedx3, cranial nerves grossly intact. moves all 4 extremities w/o difficulty. Affect pleasant   Assessment/Plan: 1. Chronic diastolic CHF: Echo (11/22) with EF 60-65%, mild LVH, mildly dilated LV, moderate aortic stenosis (trileaflet aortic valve) with mean gradient 22 mmHg, DI 0.27.  Symptoms are confounded by morbid obesity.  Exam remains quite difficult for volume though weight is down about 2 lbs. He has had multiple admissions this year with CHF.   S/P Cardiomems . [facility] Rep here to assess his pillow for readings.   NYHA III. Volume status stable.  - Continue torsemide 80a mg/60 pm  - Continue dapagliflozin 10 daily.  - Continue spironolactone 25 daily. 2. Aortic stenosis: At least moderate AS on last echo though images difficult.  Murmur not impressive but he is obese so may not be well-heard.  - Had TEE 08/26/20 with moderate aortic stenosis.  3. Atrial fibrillation: Paroxysmal. Regular on exam.  - Continue apixaban.  - Continue Toprol XL and diltiazem CD.  4. OSA/?OHS: Continue BIPAP nightly.  5. Obesity Body mass index is 48.79 kg/m.  Discussed portion control.    Ok to move forward with spinal cord stimulator trial procedure. Per Dr Aundra Dubin ok to hold Eliquis prior to procedure. Placed back on eliquis 5  days after procedure.    Follow up with Dr Aundra Dubin in 3 months.  Skanda Worlds NP-C  09/23/2021

## 2021-09-23 ENCOUNTER — Ambulatory Visit (HOSPITAL_COMMUNITY)
Admission: RE | Admit: 2021-09-23 | Discharge: 2021-09-23 | Disposition: A | Discharge status: Home / Self Care | Payer: Medicare HMO | Source: Ambulatory Visit | Attending: Adult Health | Admitting: Adult Health

## 2021-09-23 ENCOUNTER — Other Ambulatory Visit: Payer: Self-pay

## 2021-09-23 ENCOUNTER — Encounter (HOSPITAL_COMMUNITY): Payer: Self-pay

## 2021-09-23 VITALS — BP 110/78 | HR 85 | Ht 74.0 in | Wt 380.0 lb

## 2021-09-23 DIAGNOSIS — Z6841 Body Mass Index (BMI) 40.0 and over, adult: Secondary | ICD-10-CM | POA: Diagnosis not present

## 2021-09-23 DIAGNOSIS — Z79899 Other long term (current) drug therapy: Secondary | ICD-10-CM | POA: Insufficient documentation

## 2021-09-23 DIAGNOSIS — Z596 Low income: Secondary | ICD-10-CM | POA: Insufficient documentation

## 2021-09-23 DIAGNOSIS — Z5982 Transportation insecurity: Secondary | ICD-10-CM | POA: Diagnosis not present

## 2021-09-23 DIAGNOSIS — Z7901 Long term (current) use of anticoagulants: Secondary | ICD-10-CM | POA: Diagnosis not present

## 2021-09-23 DIAGNOSIS — I350 Nonrheumatic aortic (valve) stenosis: Secondary | ICD-10-CM | POA: Diagnosis not present

## 2021-09-23 DIAGNOSIS — G4733 Obstructive sleep apnea (adult) (pediatric): Secondary | ICD-10-CM | POA: Diagnosis not present

## 2021-09-23 DIAGNOSIS — I480 Paroxysmal atrial fibrillation: Secondary | ICD-10-CM | POA: Diagnosis not present

## 2021-09-23 DIAGNOSIS — Z5941 Food insecurity: Secondary | ICD-10-CM | POA: Diagnosis not present

## 2021-09-23 DIAGNOSIS — R6889 Other general symptoms and signs: Secondary | ICD-10-CM | POA: Diagnosis not present

## 2021-09-23 DIAGNOSIS — I110 Hypertensive heart disease with heart failure: Secondary | ICD-10-CM | POA: Insufficient documentation

## 2021-09-23 DIAGNOSIS — I5032 Chronic diastolic (congestive) heart failure: Secondary | ICD-10-CM | POA: Diagnosis not present

## 2021-09-23 NOTE — Patient Instructions (Signed)
It was great to see you today! No medication changes are needed at this time.   Your physician recommends that you schedule a follow-up appointment in: 3 months with Dr [facility]  Do the following things EVERYDAY: Weigh yourself in the morning before breakfast. Write it down and keep it in a log. Take your medicines as prescribed Eat low salt foods--Limit salt (sodium) to 2000 mg per day.  Stay as active as you can everyday Limit all fluids for the day to less than 2 liters  At the [facility], you and your health needs are our priority. As part of our continuing mission to provide you with exceptional heart care, we have created designated Provider Care Teams. These Care Teams include your primary Cardiologist (physician) and Advanced Practice Providers (APPs- Physician Assistants and Nurse Practitioners) who all work together to provide you with the care you need, when you need it.   You may see any of the following providers on your designated Care Team at your next follow up: Dr Daniel Bensimhon [facility] Amy Clegg, NP Brittainy Simmons, PA Jessica Milford,NP Lindsay Finch, PA Lauren Kemp, PharmD   Please be sure to bring in all your medications bottles to every appointment.    If you have any questions or concerns before your next appointment please send us a message through mychart or call our office at [phone].    TO LEAVE A MESSAGE FOR THE NURSE SELECT OPTION 2, PLEASE LEAVE A MESSAGE INCLUDING: YOUR NAME DATE OF BIRTH CALL BACK NUMBER REASON FOR CALL**this is important as we prioritize the call backs  YOU WILL RECEIVE A CALL BACK THE SAME DAY AS LONG AS YOU CALL BEFORE 4:00 PM   

## 2021-09-26 ENCOUNTER — Other Ambulatory Visit: Payer: Self-pay

## 2021-09-26 ENCOUNTER — Encounter (HOSPITAL_COMMUNITY): Payer: Self-pay | Admitting: Emergency Medicine

## 2021-09-26 ENCOUNTER — Emergency Department (HOSPITAL_COMMUNITY)
Admission: EM | Admit: 2021-09-26 | Discharge: 2021-09-26 | Disposition: A | Discharge status: Home / Self Care | Payer: No Typology Code available for payment source | Attending: Emergency Medicine | Admitting: Emergency Medicine

## 2021-09-26 DIAGNOSIS — E1140 Type 2 diabetes mellitus with diabetic neuropathy, unspecified: Secondary | ICD-10-CM | POA: Diagnosis not present

## 2021-09-26 DIAGNOSIS — Z7401 Bed confinement status: Secondary | ICD-10-CM | POA: Diagnosis not present

## 2021-09-26 DIAGNOSIS — R58 Hemorrhage, not elsewhere classified: Secondary | ICD-10-CM | POA: Diagnosis not present

## 2021-09-26 DIAGNOSIS — J45909 Unspecified asthma, uncomplicated: Secondary | ICD-10-CM | POA: Diagnosis not present

## 2021-09-26 DIAGNOSIS — Z7901 Long term (current) use of anticoagulants: Secondary | ICD-10-CM | POA: Insufficient documentation

## 2021-09-26 DIAGNOSIS — E669 Obesity, unspecified: Secondary | ICD-10-CM | POA: Diagnosis not present

## 2021-09-26 DIAGNOSIS — I83899 Varicose veins of unspecified lower extremities with other complications: Secondary | ICD-10-CM

## 2021-09-26 DIAGNOSIS — I8392 Asymptomatic varicose veins of left lower extremity: Secondary | ICD-10-CM | POA: Insufficient documentation

## 2021-09-26 DIAGNOSIS — Z7984 Long term (current) use of oral hypoglycemic drugs: Secondary | ICD-10-CM | POA: Diagnosis not present

## 2021-09-26 DIAGNOSIS — I110 Hypertensive heart disease with heart failure: Secondary | ICD-10-CM | POA: Insufficient documentation

## 2021-09-26 DIAGNOSIS — J449 Chronic obstructive pulmonary disease, unspecified: Secondary | ICD-10-CM | POA: Insufficient documentation

## 2021-09-26 DIAGNOSIS — I509 Heart failure, unspecified: Secondary | ICD-10-CM | POA: Diagnosis not present

## 2021-09-26 DIAGNOSIS — Z743 Need for continuous supervision: Secondary | ICD-10-CM | POA: Diagnosis not present

## 2021-09-26 DIAGNOSIS — Z79899 Other long term (current) drug therapy: Secondary | ICD-10-CM | POA: Diagnosis not present

## 2021-09-26 LAB — CBC WITH DIFFERENTIAL/PLATELET
Abs Immature Granulocytes: 0.11 10*3/uL — ABNORMAL HIGH (ref 0.00–0.07)
Basophils Absolute: 0.1 10*3/uL (ref 0.0–0.1)
Basophils Relative: 0 %
Eosinophils Absolute: 0.5 10*3/uL (ref 0.0–0.5)
Eosinophils Relative: 4 %
HCT: 44.5 % (ref 39.0–52.0)
Hemoglobin: 14.2 g/dL (ref 13.0–17.0)
Immature Granulocytes: 1 %
Lymphocytes Relative: 12 %
Lymphs Abs: 1.6 10*3/uL (ref 0.7–4.0)
MCH: 28.9 pg (ref 26.0–34.0)
MCHC: 31.9 g/dL (ref 30.0–36.0)
MCV: 90.6 fL (ref 80.0–100.0)
Monocytes Absolute: 0.7 10*3/uL (ref 0.1–1.0)
Monocytes Relative: 6 %
Neutro Abs: 10.3 10*3/uL — ABNORMAL HIGH (ref 1.7–7.7)
Neutrophils Relative %: 77 %
Platelets: 282 10*3/uL (ref 150–400)
RBC: 4.91 MIL/uL (ref 4.22–5.81)
RDW: 14.9 % (ref 11.5–15.5)
WBC: 13.3 10*3/uL — ABNORMAL HIGH (ref 4.0–10.5)
nRBC: 0 % (ref 0.0–0.2)

## 2021-09-26 MED ORDER — SILVER NITRATE-POT NITRATE 75-25 % EX MISC
1.0000 | CUTANEOUS | Status: DC
  Filled 2021-09-26: qty 1

## 2021-09-26 MED ORDER — OXYCODONE HCL 5 MG PO TABS
10.0000 mg | ORAL_TABLET | ORAL | Status: AC
  Administered 2021-09-26: 10 mg via ORAL
  Filled 2021-09-26: qty 2

## 2021-09-26 NOTE — ED Notes (Signed)
MD in room irrigating left ankle wound

## 2021-09-26 NOTE — ED Notes (Signed)
[facility] called to transport patient  

## 2021-09-26 NOTE — ED Triage Notes (Signed)
Pt to triage via [facility] from home.  C/o varicose vein in L ankle bleeding x 30 min.  Gauze bandage in place.  Takes Eliquis.

## 2021-09-26 NOTE — ED Provider Notes (Signed)
[place] EMERGENCY DEPARTMENT Provider Note   CSN: [id number] Arrival date & time: 09/26/21  1147     History  Chief Complaint  Patient presents with   vericose vein bleeding    Lawrence [name] is a 66 y.o. male with history significant for diabetic neuropathy, type 2 diabetes, osteoarthritis, asthma, COPD, CHF, hypertension, morbid obesity, seizures.  Patient presents to ED via EMS for evaluation of bleeding varicose vein.  Patient states around 12:30 PM he was in his usual state of health when he reached down to "scratch and itch on my ankle" and noticed immediate blood dripping down his foot.  Patient has diabetic neuropathy, he states that it makes it difficult for him to feel any kind of cut for laceration on his feet or ankles.  Patient currently anticoagulated on Eliquis due to A-fib. Patient states that he was evaluated by EMS, he told him that he felt he was fine as the area had stopped bleeding however they insisted that he was brought in for evaluation.  Patient denies lightheadedness, dizziness, weakness, nausea, vomiting, chest pain, shortness of breath, abdominal pain.  HPI     Home Medications Prior to Admission medications   Medication Sig Start Date End Date Taking? Authorizing Provider  Accu-Chek FastClix Lancets MISC 1 each by Other route in the morning and at bedtime. 12/22/20   [provider]  acetaminophen (TYLENOL) 325 MG tablet Take 2 tablets (650 mg total) by mouth every 4 (four) hours as needed for mild pain (or temp > 37.5 C (99.5 F)). 01/26/21   Cherene Altes, MD  albuterol (VENTOLIN HFA) 108 (90 Base) MCG/ACT inhaler Inhale 2 puffs into the lungs every 6 (six) hours as needed for wheezing or shortness of breath. 03/10/20   [provider]  Alcohol Swabs (ALCOHOL PREP) 70 % PADS 1 each by Other route in the morning, at noon, and at bedtime. 12/22/20   [provider]  allopurinol (ZYLOPRIM) 100 MG tablet Take  200 mg by mouth daily.    [provider]  apixaban (ELIQUIS) 5 MG TABS tablet Take 1 tablet (5 mg total) by mouth 2 (two) times daily. 06/24/21   Sueanne Margarita, MD  ARIPiprazole (ABILIFY) 5 MG tablet TAKE ONE TABLET BY MOUTH EVERYDAY AT BEDTIME 09/09/21   Mozingo, Berdie Ogren, NP  atorvastatin (LIPITOR) 40 MG tablet Take 1 tablet (40 mg total) by mouth every evening. TAKE ONE TABLET BY MOUTH DAILY AT 6PM 06/24/21   Sueanne Margarita, MD  B-D ULTRAFINE III SHORT PEN 31G X 8 MM MISC 1 each by Other route See admin instructions. Use to inject insulin subcutaneously at bedtime as directed. 11/20/20   [provider]  blood glucose meter kit and supplies Dispense based on patient and insurance preference. Use up to four times daily as directed. (FOR ICD-10 E10.9, E11.9). 11/09/17   Eugenie Filler, MD  busPIRone (BUSPAR) 5 MG tablet Take 1 tablet (5 mg total) by mouth 3 (three) times daily. 09/09/21   Mozingo, Berdie Ogren, NP  calcium carbonate (TUMS - DOSED IN MG ELEMENTAL CALCIUM) 500 MG chewable tablet Chew 1 tablet by mouth daily as needed for indigestion or heartburn.    [provider]  cetirizine (ZYRTEC) 10 MG tablet Take 10 mg by mouth at bedtime.     [provider]  Cholecalciferol (VITAMIN D3 PO) Take 2,000 Units by mouth daily.    [provider]  dapagliflozin propanediol (FARXIGA) 10 MG  TABS tablet Take 1 tablet (10 mg total) by mouth daily before breakfast. 01/05/21   Renato Shin, MD  diltiazem (CARDIZEM CD) 180 MG 24 hr capsule Take 1 capsule (180 mg total) by mouth daily. 06/24/21   Turner, Eber Hong, MD  Dulaglutide (TRULICITY) 4.5 JF/3.5KT SOPN Inject 4.5 mg as directed once a week. 11/19/20   Renato Shin, MD  ferrous sulfate 324 (65 Fe) MG TBEC Take 324 mg by mouth daily with breakfast.     [provider]  fluticasone (FLONASE) 50 MCG/ACT nasal spray Place 2 sprays into both nostrils daily.     [provider]   isosorbide mononitrate (IMDUR) 30 MG 24 hr tablet Take 1 tablet (30 mg total) by mouth daily. 06/24/21   Sueanne Margarita, MD  levETIRAcetam (KEPPRA) 500 MG tablet Take 1 tablet (500 mg total) by mouth 2 (two) times daily. 09/14/21   Alric Ran, MD  metFORMIN (GLUCOPHAGE-XR) 500 MG 24 hr tablet Take 4 tablets (2,000 mg total) by mouth daily. 06/16/21   Renato Shin, MD  metoprolol succinate (TOPROL-XL) 50 MG 24 hr tablet Take 1 tablet (50 mg total) by mouth daily. Take with or immediately following a meal. 06/24/21 09/23/21  Turner, Eber Hong, MD  omeprazole (PRILOSEC) 20 MG capsule Take 20 mg by mouth every morning. 05/29/20   [provider]  Oxycodone HCl 10 MG TABS Take 10 mg by mouth as needed.    [provider]  potassium chloride SA (KLOR-CON M) 20 MEQ tablet Take 2 tablets (40 mEq total) by mouth 2 (two) times daily. 08/26/21   Larey Dresser, MD  prazosin (MINIPRESS) 2 MG capsule TAKE TWO CAPSULES BY MOUTH EVERYDAY AT BEDTIME 07/08/21   Sueanne Margarita, MD  pregabalin (LYRICA) 150 MG capsule Take 1 capsule (150 mg total) by mouth 3 (three) times daily. Please discuss future refills with your PCP. 07/12/21   Marcial Pacas, MD  repaglinide (PRANDIN) 2 MG tablet Take 2 tablets (4 mg total) by mouth 2 (two) times daily before a meal. 07/15/21   Renato Shin, MD  spironolactone (ALDACTONE) 25 MG tablet Take 1 tablet (25 mg total) by mouth daily. 06/24/21   Sueanne Margarita, MD  temazepam (RESTORIL) 15 MG capsule TAKE ONE CAPSULE BY MOUTH EVERYDAY AT BEDTIME 09/09/21   Mozingo, Berdie Ogren, NP  Tiotropium Bromide-Olodaterol (STIOLTO RESPIMAT) 2.5-2.5 MCG/ACT AERS Inhale 2 puffs into the lungs daily. 07/19/21   Hunsucker, Bonna Gains, MD  torsemide (DEMADEX) 20 MG tablet Take 4 tablets (80 mg total) by mouth every morning AND 3 tablets (60 mg total) every evening. 08/26/21   Larey Dresser, MD  traZODone (DESYREL) 100 MG tablet Take 2 tablets (200 mg total) by mouth at bedtime. Take  200 mg by mouth at bedtime. 09/09/21   Mozingo, Berdie Ogren, NP  venlafaxine XR (EFFEXOR-XR) 75 MG 24 hr capsule Take 3 capsules (225 mg total) by mouth daily with breakfast. 09/09/21   Mozingo, Berdie Ogren, NP      Allergies    Vancomycin; Niacin; Niacin and related; Tubersol [tuberculin, ppd]; and Doxycycline    Review of Systems   Review of Systems  Respiratory:  Negative for shortness of breath.   Cardiovascular:  Negative for chest pain.  Gastrointestinal:  Negative for abdominal pain, nausea and vomiting.  Neurological:  Negative for dizziness, weakness and light-headedness.  Hematological:  Bruises/bleeds easily.   Physical Exam Updated Vital Signs BP 130/81 (BP Location: Left Arm)    Pulse 85  Temp (!) 97.5 F (36.4 C) (Tympanic)    Resp 15    SpO2 93%  Physical Exam Vitals and nursing note reviewed.  Constitutional:      General: He is not in acute distress.    Appearance: He is obese. He is not ill-appearing, toxic-appearing or diaphoretic.  HENT:     Head: Normocephalic and atraumatic.     Nose: Nose normal.     Mouth/Throat:     Mouth: Mucous membranes are moist.  Eyes:     Extraocular Movements: Extraocular movements intact.     Conjunctiva/sclera: Conjunctivae normal.     Pupils: Pupils are equal, round, and reactive to light.  Cardiovascular:     Rate and Rhythm: Normal rate and regular rhythm.  Pulmonary:     Effort: Pulmonary effort is normal.     Breath sounds: Normal breath sounds. No wheezing.  Abdominal:     General: Abdomen is flat.     Palpations: Abdomen is soft.     Tenderness: There is no abdominal tenderness.  Musculoskeletal:        General: Normal range of motion.     Cervical back: Normal range of motion and neck supple. No rigidity or tenderness.  Skin:    General: Skin is warm and dry.     Capillary Refill: Capillary refill takes less than 2 seconds.     Comments: 1 cm bleeding wound noted to lateral left ankle.  Neurological:      Mental Status: He is alert and oriented to person, place, and time.    ED Results / Procedures / Treatments   Labs (all labs ordered are listed, but only abnormal results are displayed) Labs Reviewed  CBC WITH DIFFERENTIAL/PLATELET - Abnormal; Notable for the following components:      Result Value   WBC 13.3 (*)    Neutro Abs 10.3 (*)    Abs Immature Granulocytes 0.11 (*)    All other components within normal limits    EKG None  Radiology No results found.  Procedures Procedures    Medications Ordered in ED Medications  silver nitrate applicators applicator 1 Stick (has no administration in time range)  oxyCODONE (Oxy IR/ROXICODONE) immediate release tablet 10 mg (has no administration in time range)    ED Course/ Medical Decision Making/ A&P                           Medical Decision Making Amount and/or Complexity of Data Reviewed Labs: ordered.  Risk Prescription drug management.   66 year old male presents due to bleeding varicose vein.  On examination, the patient has a 1 cm area on the lateral left ankle. There is a pressure bandage in place, the bleeding is controlled at this time.  Patient nontoxic in appearance, not hypoxic, nontachycardic, hemodynamically stable.  On assessment of varicose vein, the area began to bleed.  I attempted to control the bleeding with gauze and pressure however the oozing continued.  At this time, I applied silver nitrate stick to the area which cauterized the bleeding wound.  Patient was observed for 20 minutes after cauterization, the wound was noted to remain cauterized.  There is no active bleeding noted.  At this time, the patient had his left ankle wrapped with bacitracin impregnated gauze, additional gauze and coban to ensure pressure.  The bandage does not have any blood soaking through.  I have advised the patient to leave the bandage in place for 24  hours and then remove it tomorrow by soaking the foot in warm water to  loosen any dried blood that might form.  At this time, the patient is stable for discharge.  He will require transportation back to his home via convalescent transport service.  Patient is requesting his diabetic neuropathy medication at this time, I will provide it.  Patient given return precautions and he voiced understanding.  Patient had all of his questions answered to his satisfaction.  The patient is stable at discharge.   Final Clinical Impression(s) / ED Diagnoses Final diagnoses:  Bleeding from varicose vein    Rx / DC Orders ED Discharge Orders     None         Azucena Cecil, PA-C 09/26/21 [place], DO 09/26/21 1557

## 2021-09-26 NOTE — ED Notes (Incomplete)
Foot/ankle cleansed with soap and water, this caused bleeding to restart. PA aware. Pressure dressing applied with coban.

## 2021-09-26 NOTE — Discharge Instructions (Signed)
Please return to ED with any new or worsening signs or symptoms such as continued bleeding, lightheadedness, dizziness, weakness Please keep the bandage in place for 24 hours.  Remove it by soaking the gauze as we discussed Please refer to the attached informational document I provided that details bleeding varicose veins and ways to treat them

## 2021-09-29 DIAGNOSIS — R6889 Other general symptoms and signs: Secondary | ICD-10-CM | POA: Diagnosis not present

## 2021-09-30 DIAGNOSIS — Z0000 Encounter for general adult medical examination without abnormal findings: Secondary | ICD-10-CM | POA: Diagnosis not present

## 2021-09-30 DIAGNOSIS — R6889 Other general symptoms and signs: Secondary | ICD-10-CM | POA: Diagnosis not present

## 2021-09-30 DIAGNOSIS — Z1389 Encounter for screening for other disorder: Secondary | ICD-10-CM | POA: Diagnosis not present

## 2021-10-01 ENCOUNTER — Other Ambulatory Visit: Payer: Self-pay | Admitting: Cardiology

## 2021-10-01 ENCOUNTER — Other Ambulatory Visit: Payer: Self-pay | Admitting: Endocrinology

## 2021-10-06 ENCOUNTER — Other Ambulatory Visit: Payer: Self-pay

## 2021-10-06 ENCOUNTER — Ambulatory Visit (INDEPENDENT_AMBULATORY_CARE_PROVIDER_SITE_OTHER): Payer: Medicare HMO | Admitting: Podiatry

## 2021-10-06 ENCOUNTER — Encounter: Payer: Self-pay | Admitting: Podiatry

## 2021-10-06 DIAGNOSIS — M79674 Pain in right toe(s): Secondary | ICD-10-CM

## 2021-10-06 DIAGNOSIS — I878 Other specified disorders of veins: Secondary | ICD-10-CM | POA: Diagnosis not present

## 2021-10-06 DIAGNOSIS — R6889 Other general symptoms and signs: Secondary | ICD-10-CM | POA: Diagnosis not present

## 2021-10-06 DIAGNOSIS — E1142 Type 2 diabetes mellitus with diabetic polyneuropathy: Secondary | ICD-10-CM

## 2021-10-06 DIAGNOSIS — B351 Tinea unguium: Secondary | ICD-10-CM | POA: Diagnosis not present

## 2021-10-06 DIAGNOSIS — M79675 Pain in left toe(s): Secondary | ICD-10-CM

## 2021-10-06 NOTE — Progress Notes (Signed)
This patient returns to my office for at risk foot care.  This patient requires this care by a professional since this patient will be at risk due to having diabetic neuropathy and angiopathy and venous stasis.  . This patient presents to the office with his wife.This patient is unable to cut nails himself since the patient cannot reach his nails.These nails are painful walking and wearing shoes. This patient presents for at risk foot care today. ? ?General Appearance  Alert, conversant and in no acute stress. ? ?Vascular  Dorsalis pedis and posterior tibial  pulses are not  palpable  billaterally due to severe swelling feet.  Capillary return is within normal limits  bilaterally. Temperature is within normal limits  Bilaterally. Venous stasis right leg. ? ?Neurologic  Senn-Weinstein monofilament wire test absent right foot.  Diminished left foot.. Muscle power within normal limits bilaterally. ? ?Nails Thick disfigured discolored nails with subungual debris  from hallux to fifth toes bilaterally.  Pincer hallux nails   B/L.  ? ?Orthopedic  No limitations of motion  feet .  No crepitus or effusions noted.  No bony pathology or digital deformities noted.  Severe HAV  B/L. ? ?Skin  normotropic skin with no porokeratosis noted bilaterally.  No signs of infections or ulcers noted.    ? ?Onychomycosis  Pain in right toes  Pain in left toes  Paronychia left hallux. ? ?Consent was obtained for treatment procedures.   Mechanical debridement of nails 1-5  bilaterally performed with a nail nipper.  Filed with dremel without incident.  Left ankle healing since bleeding  incident. ? ? ?Return office visit   3 months                   Told patient to return for periodic foot care and evaluation due to potential at risk complications. ? ? ?Gardiner Barefoot DPM  ?

## 2021-10-09 DIAGNOSIS — H5213 Myopia, bilateral: Secondary | ICD-10-CM | POA: Diagnosis not present

## 2021-10-09 DIAGNOSIS — H524 Presbyopia: Secondary | ICD-10-CM | POA: Diagnosis not present

## 2021-10-09 DIAGNOSIS — H52209 Unspecified astigmatism, unspecified eye: Secondary | ICD-10-CM | POA: Diagnosis not present

## 2021-10-10 ENCOUNTER — Other Ambulatory Visit: Payer: Self-pay | Admitting: Endocrinology

## 2021-10-12 ENCOUNTER — Other Ambulatory Visit: Payer: Self-pay | Admitting: Adult Health

## 2021-10-12 DIAGNOSIS — G4700 Insomnia, unspecified: Secondary | ICD-10-CM

## 2021-10-12 NOTE — Progress Notes (Signed)
_0  ID: Lawrence [name], male    DOB: Dec 03, 1955, 66 y.o.   MRN: [id number]  Chief Complaint  Patient presents with   Consult    Consult for [facility].     Referring provider: Sueanne Margarita, MD  HPI:   66 y.o. man whom we are seeing in consultation for evaluation of dyspnea on exertion.  Most recent cardiology note x2 reviewed.  Discharge summary 06/10/2021 reviewed.  Patient reports several months more likely over a year history of dyspnea on exertion.  Suspect longer than this.  Worse on inclines or stairs.  Present on flat surfaces as well.  No time of day when things are better or worse.  No position that makes things better or worse.  No seasonal environmental factors he can identify to make things better or worse.  No alleviating or exacerbating factors other than rest.  Patient feels more short of breath when in atrial fibrillation.  Endorses worsening lower extremity swelling when he feels more short of breath.  Reviewed most recent chest x-ray 05/28/2021 demonstrates interstitial edema on my interpretation.  Reviewed most recent echocardiogram fall 2022 demonstrates diastolic dysfunction.  PMH: Diabetes, hypertension, atrial fibrillation, diastolic dysfunction Surgical history: Knee replacement, hernia repair Family history: Denies significant respiratory illness in first-degree relative Social history: Former smoker, 40+ pack year, quit 2017, lives in [place] / Pulmonary Flowsheets:   ACT:  No flowsheet data found.  MMRC: No flowsheet data found.  Epworth:  No flowsheet data found.  Tests:   FENO:  No results found for: NITRICOXIDE  PFT: No flowsheet data found.  WALK:  No flowsheet data found.  Imaging: Personally reviewed and as per EMR and discussion this note was  Lab Results: Personally reviewed CBC    Component Value Date/Time   WBC 13.3 (H) 09/26/2021 1313   RBC 4.91 09/26/2021 1313   HGB 14.2 09/26/2021 1313   HCT 44.5  09/26/2021 1313   PLT 282 09/26/2021 1313   MCV 90.6 09/26/2021 1313   MCH 28.9 09/26/2021 1313   MCHC 31.9 09/26/2021 1313   RDW 14.9 09/26/2021 1313   LYMPHSABS 1.6 09/26/2021 1313   MONOABS 0.7 09/26/2021 1313   EOSABS 0.5 09/26/2021 1313   BASOSABS 0.1 09/26/2021 1313    BMET    Component Value Date/Time   NA 140 08/26/2021 1337   NA 140 08/26/2021 1337   NA 136 01/07/2021 0000   K 3.6 08/26/2021 1337   K 3.6 08/26/2021 1337   CL 98 08/19/2021 1300   CO2 28 08/19/2021 1300   GLUCOSE 114 (H) 08/19/2021 1300   BUN 18 08/19/2021 1300   BUN 22 01/07/2021 0000   CREATININE 0.84 08/19/2021 1300   CALCIUM 9.5 08/19/2021 1300   GFRNONAA >60 08/19/2021 1300   GFRAA 77 05/20/2020 1038    BNP    Component Value Date/Time   BNP 37.0 08/19/2021 1300    ProBNP No results found for: PROBNP  Specialty Problems       Pulmonary Problems   OSA (obstructive sleep apnea)   Acute respiratory failure with hypoxia (HCC)   Shortness of breath   COPD (chronic obstructive pulmonary disease) (HCC)   Pneumonia    Allergies  Allergen Reactions   Vancomycin Other (See Comments)    "Red Man Syndrome" 02/02/17: possible cause for rash under both arms   Niacin Other (See Comments)   Niacin And Related Other (See Comments)    Red man syndrome   Tubersol [Tuberculin,  Ppd] Other (See Comments)    Reaction unknown   Doxycycline Rash and Other (See Comments)    Immunization History  Administered Date(s) Administered   Influenza Split 04/13/2015, 05/23/2016   Influenza,inj,Quad PF,6+ Mos 08/16/2017, 06/22/2018, 04/04/2019   Influenza-Unspecified 06/28/2007, 06/22/2018, 05/09/2019   Moderna Sars-Covid-2 Vaccination 10/16/2018, 10/16/2019, 11/13/2019, 06/12/2020, 12/24/2020   Pneumococcal Polysaccharide-23 09/16/2015, 10/18/2020   Tdap 09/16/2015    Past Medical History:  Diagnosis Date   Acquired dilation of ascending aorta and aortic root ([place])    41m by echo 01/2021    Adenomatous colon polyp 2007   Anemia    Anxiety    Aortic stenosis    moderate AS by echo 01/2021   Asthma    BPH without obstruction/lower urinary tract symptoms 02/22/2017   Chronic diastolic (congestive) heart failure (HCC)    Chronic venous stasis 03/07/2019   COPD (chronic obstructive pulmonary disease) (HCC)    Coronary artery calcification seen on CAT scan    Depression    Diabetic neuropathy ([place] 09/11/2019   History of colon polyps 08/24/2018   Hypertension    Morbid obesity (HCC)    OSA (obstructive sleep apnea)    Pain due to onychomycosis of toenails of both feet 09/11/2019   Peripheral neuropathy 02/22/2017   Primary osteoarthritis, left shoulder 03/05/2017   PTSD (post-traumatic stress disorder)    Pure hypercholesterolemia    QT prolongation 03/07/2019   Seizures (HCC)    Shortness of breath    Sinus tachycardia 03/07/2019   Sleep apnea    CPAP   Type 2 diabetes mellitus with vascular disease ([place] 09/11/2019    Tobacco History: Social History   Tobacco Use  Smoking Status Former   Packs/day: 1.00   Years: 44.00   Pack years: 44.00   Types: Cigarettes   Quit date: 03/07/2016   Years since quitting: 5.6   Passive exposure: Past  Smokeless Tobacco Never   Counseling given: Not Answered   Continue to not smoke  Outpatient Encounter Medications as of 07/19/2021  Medication Sig   Accu-Chek FastClix Lancets MISC 1 each by Other route in the morning and at bedtime.   acetaminophen (TYLENOL) 325 MG tablet Take 2 tablets (650 mg total) by mouth every 4 (four) hours as needed for mild pain (or temp > 37.5 C (99.5 F)).   albuterol (VENTOLIN HFA) 108 (90 Base) MCG/ACT inhaler Inhale 2 puffs into the lungs every 6 (six) hours as needed for wheezing or shortness of breath.   Alcohol Swabs (ALCOHOL PREP) 70 % PADS 1 each by Other route in the morning, at noon, and at bedtime.   allopurinol (ZYLOPRIM) 100 MG tablet Take 200 mg by mouth daily.   apixaban  (ELIQUIS) 5 MG TABS tablet Take 1 tablet (5 mg total) by mouth 2 (two) times daily.   atorvastatin (LIPITOR) 40 MG tablet Take 1 tablet (40 mg total) by mouth every evening. TAKE ONE TABLET BY MOUTH DAILY AT 6PM   B-D ULTRAFINE III SHORT PEN 31G X 8 MM MISC 1 each by Other route See admin instructions. Use to inject insulin subcutaneously at bedtime as directed.   blood glucose meter kit and supplies Dispense based on patient and insurance preference. Use up to four times daily as directed. (FOR ICD-10 E10.9, E11.9).   cetirizine (ZYRTEC) 10 MG tablet Take 10 mg by mouth at bedtime.    Cholecalciferol (VITAMIN D3 PO) Take 2,000 Units by mouth daily.   diltiazem (CARDIZEM CD) 180 MG 24 hr  capsule Take 1 capsule (180 mg total) by mouth daily.   ferrous sulfate 324 (65 Fe) MG TBEC Take 324 mg by mouth daily with breakfast.    fluticasone (FLONASE) 50 MCG/ACT nasal spray Place 2 sprays into both nostrils daily.    metFORMIN (GLUCOPHAGE-XR) 500 MG 24 hr tablet Take 4 tablets (2,000 mg total) by mouth daily.   metoprolol succinate (TOPROL-XL) 50 MG 24 hr tablet Take 1 tablet (50 mg total) by mouth daily. Take with or immediately following a meal.   omeprazole (PRILOSEC) 20 MG capsule Take 20 mg by mouth every morning.   [EXPIRED] Oxycodone HCl 10 MG TABS Take by mouth.   prazosin (MINIPRESS) 2 MG capsule TAKE TWO CAPSULES BY MOUTH EVERYDAY AT BEDTIME   pregabalin (LYRICA) 150 MG capsule Take 1 capsule (150 mg total) by mouth 3 (three) times daily. Please discuss future refills with your PCP.   repaglinide (PRANDIN) 2 MG tablet Take 2 tablets (4 mg total) by mouth 2 (two) times daily before a meal.   spironolactone (ALDACTONE) 25 MG tablet Take 1 tablet (25 mg total) by mouth daily.   Tiotropium Bromide-Olodaterol (STIOLTO RESPIMAT) 2.5-2.5 MCG/ACT AERS Inhale 2 puffs into the lungs daily.   [DISCONTINUED] ARIPiprazole (ABILIFY) 5 MG tablet TAKE ONE TABLET BY MOUTH EVERYDAY AT BEDTIME   [DISCONTINUED]  busPIRone (BUSPAR) 5 MG tablet TAKE ONE TABLET BY MOUTH THREE TIMES DAILY   [DISCONTINUED] dapagliflozin propanediol (FARXIGA) 10 MG TABS tablet Take 1 tablet (10 mg total) by mouth daily before breakfast.   [DISCONTINUED] Dulaglutide (TRULICITY) 4.5 QV/9.5GL SOPN Inject 4.5 mg as directed once a week.   [DISCONTINUED] isosorbide mononitrate (IMDUR) 30 MG 24 hr tablet Take 1 tablet (30 mg total) by mouth daily.   [DISCONTINUED] potassium chloride SA (KLOR-CON M) 20 MEQ tablet Take 2 tablets (40 mEq total) by mouth every morning AND 1 tablet (20 mEq total) every evening.   [DISCONTINUED] temazepam (RESTORIL) 15 MG capsule TAKE ONE CAPSULE BY MOUTH EVERYDAY AT BEDTIME   [DISCONTINUED] torsemide (DEMADEX) 20 MG tablet Take 3 tablets (60 mg total) by mouth every morning AND 2 tablets (40 mg total) every evening.   [DISCONTINUED] traZODone (DESYREL) 100 MG tablet Take 2 tablets (200 mg total) by mouth at bedtime.   [DISCONTINUED] venlafaxine XR (EFFEXOR-XR) 75 MG 24 hr capsule Take 3 capsules (225 mg total) by mouth daily with breakfast.   [DISCONTINUED] vitamin B-12 1000 MCG tablet Take 1 tablet (1,000 mcg total) by mouth daily.   No facility-administered encounter medications on file as of 07/19/2021.     Review of Systems  Review of Systems  no chest pain with exertion.  No orthopnea PND.  Comprehensive review of systems otherwise negative. Physical Exam  BP 136/72 (BP Location: Left Arm, Patient Position: Sitting, Cuff Size: Normal)    Pulse 84    Temp 97.9 F (36.6 C) (Oral)    Ht _0  (1.88 m)    Wt (!) 381 lb (172.8 kg)    SpO2 94%    BMI 48.92 kg/m   Wt Readings from Last 5 Encounters:  09/23/21 (!) 380 lb (172.4 kg)  09/10/21 (!) 376 lb (170.6 kg)  08/26/21 (!) 384 lb 0.7 oz (174.2 kg)  08/19/21 (!) 384 lb (174.2 kg)  07/20/21 (!) 382 lb (173.3 kg)    BMI Readings from Last 5 Encounters:  09/23/21 48.79 kg/m  09/10/21 48.28 kg/m  08/26/21 49.31 kg/m  08/19/21 49.30 kg/m   08/09/21 59.70 kg/m  Physical Exam  General: Obese, sitting in a chair Eyes: EOMI, icterus Neck: Supple, normal range of motion Pulmonary: Clear, normal work of breathing Cardiovascular: Irregular, no murmur Abdomen: Obese, nondistended MSK: No synovitis, no joint effusion Neuro: No weakness, sensation intact Psych: Normal mood, full affect with  Assessment & Plan:   Dyspnea on exertion/shortness of breath: New evaluation.  Likely multifactorial and largely related to fluid overload exacerbated atrial fibrillation given prior chest imaging.  Possible developing COPD given his history of cigarette smoking.  Likely contribution of habitus causing extraparenchymal restriction.  Also likely history of deconditioning.  He does endorse seasonal allergies so asthma is also possibility.  PFTs for further evaluation.  Stiolto prescribed to see if this helps symptomatically.     Return in about 3 months (around 10/17/2021).   Lanier Clam, MD 10/12/2021

## 2021-10-14 ENCOUNTER — Ambulatory Visit (INDEPENDENT_AMBULATORY_CARE_PROVIDER_SITE_OTHER): Payer: Medicare HMO | Admitting: Endocrinology

## 2021-10-14 ENCOUNTER — Other Ambulatory Visit: Payer: Self-pay

## 2021-10-14 VITALS — BP 110/60 | HR 102 | Ht 74.0 in

## 2021-10-14 DIAGNOSIS — E1169 Type 2 diabetes mellitus with other specified complication: Secondary | ICD-10-CM | POA: Diagnosis not present

## 2021-10-14 DIAGNOSIS — R6889 Other general symptoms and signs: Secondary | ICD-10-CM | POA: Diagnosis not present

## 2021-10-14 DIAGNOSIS — E1159 Type 2 diabetes mellitus with other circulatory complications: Secondary | ICD-10-CM

## 2021-10-14 DIAGNOSIS — E669 Obesity, unspecified: Secondary | ICD-10-CM

## 2021-10-14 LAB — POCT GLYCOSYLATED HEMOGLOBIN (HGB A1C): Hemoglobin A1C: 8 % — AB (ref 4.0–5.6)

## 2021-10-14 MED ORDER — BROMOCRIPTINE MESYLATE 2.5 MG PO TABS: 2.5000 mg | ORAL_TABLET | Freq: Every day | ORAL | 3 refills | Status: DC

## 2021-10-14 NOTE — Patient Instructions (Addendum)
check your blood sugar twice a day.  vary the time of day when you check, between before the 3 meals, and at bedtime.  also check if you have symptoms of your blood sugar being too high or too low.  please keep a record of the readings and bring it to your next appointment here (or you can bring the meter itself).  You can write it on any piece of paper.  please call us sooner if your blood sugar goes below 70, or if most of your readings are over 200.  ?Please add "bromocriptine," to help your blood sugar. It has possible side effects of nausea and dizziness.  These go away with time.  You can avoid these by taking it at bedtime, and by taking just take 1/2 pill for the first week.   ?Please continue the same other diabetes medications.   ?Please come back for a follow-up appointment in 2 months.   ? ? ? ?

## 2021-10-14 NOTE — Progress Notes (Signed)
Subjective:    Patient ID: Lawrence [name], male    DOB: 09-05-1955, 66 y.o.   MRN: [id number]  HPI Pt returns for f/u of diabetes mellitus:  DM type: 2 Dx'ed: 3921 Complications: PPN, CAD, and leg ulcers.   Therapy: Trulicity, and 3 oral meds.   DKA: never Severe hypoglycemia: never Pancreatitis: never Pancreatic imaging: none known SDOH: memory loss limts rx Other: he took QD insulin; edema precludes pioglitazone.   Interval history: He brings his meter with his cbg's which I have reviewed today.  Cbg varies from 127-216.  No new sxs.  He takes meds as rx'ed.   Past Medical History:  Diagnosis Date   Acquired dilation of ascending aorta and aortic root ([place])    42m by echo 01/2021   Adenomatous colon polyp 2007   Anemia    Anxiety    Aortic stenosis    moderate AS by echo 01/2021   Asthma    BPH without obstruction/lower urinary tract symptoms 02/22/2017   Chronic diastolic (congestive) heart failure (HCC)    Chronic venous stasis 03/07/2019   COPD (chronic obstructive pulmonary disease) (HCC)    Coronary artery calcification seen on CAT scan    Depression    Diabetic neuropathy ([facility] 09/11/2019   History of colon polyps 08/24/2018   Hypertension    Morbid obesity (HCC)    OSA (obstructive sleep apnea)    Pain due to onychomycosis of toenails of both feet 09/11/2019   Peripheral neuropathy 02/22/2017   Primary osteoarthritis, left shoulder 03/05/2017   PTSD (post-traumatic stress disorder)    Pure hypercholesterolemia    QT prolongation 03/07/2019   Seizures (HCC)    Shortness of breath    Sinus tachycardia 03/07/2019   Sleep apnea    CPAP   Type 2 diabetes mellitus with vascular disease ([facility] 09/11/2019    Past Surgical History:  Procedure Laterality Date   ENDOVENOUS ABLATION SAPHENOUS VEIN W/ LASER Right 08/20/2020   endovenous laser ablation right greater saphenous vein by CGae GallopMD    JOINT REPLACEMENT     left knee replacement x 2   KNEE  ARTHROSCOPY Bilateral    LEFT HEART CATH AND CORONARY ANGIOGRAPHY N/A 01/17/2018   Procedure: LEFT HEART CATH AND CORONARY ANGIOGRAPHY;  Surgeon: HLeonie Man MD;  [facility];  Service: Cardiovascular;  Laterality: N/A;   PRESSURE SENSOR/CARDIOMEMS N/A 08/26/2021   Procedure: PRESSURE SENSOR/CARDIOMEMS;  Surgeon: MLarey Dresser MD;  [facility];  Service: Cardiovascular;  Laterality: N/A;   RIGHT HEART CATH N/A 08/26/2021   Procedure: RIGHT HEART CATH;  Surgeon: MLarey Dresser MD;  [facility];  Service: Cardiovascular;  Laterality: N/A;   TEE WITHOUT CARDIOVERSION N/A 08/26/2021   Procedure: TRANSESOPHAGEAL ECHOCARDIOGRAM (TEE);  Surgeon: MLarey Dresser MD;  [facility];  Service: Cardiovascular;  Laterality: N/A;   UMBILICAL HERNIA REPAIR      Social History   Socioeconomic History   Marital status: Significant Other    Spouse name: JJone Baseman  Number of children: 1   Years of education: Not on file   Highest education level: Associate degree: academic program  Occupational History   Occupation: retired    Comment: [employer] Tobacco Use   Smoking status: Former    Packs/day: 1.00    Years: 44.00    Pack years: 44.00    Types: Cigarettes    Quit date: 03/07/2016  Years since quitting: 5.6    Passive exposure: Past   Smokeless tobacco: Never  Vaping Use   Vaping Use: Never used  Substance and Sexual Activity   Alcohol use: Yes    Comment: rare   Drug use: No   Sexual activity: Not on file  Other Topics Concern   Not on file  Social History Narrative   Lives with wife [name]   Right Handed   Drinks 1-2 cups caffeine   Social Determinants of Health   Financial Resource Strain: Medium Risk   Difficulty of Paying Living Expenses: Somewhat hard  Food Insecurity: Food Insecurity Present   Worried About Charity fundraiser in the Last Year: Sometimes true   Ran Out of Food in the Last  Year: Never true  Transportation Needs: Unmet Transportation Needs   Lack of Transportation (Medical): Yes   Lack of Transportation (Non-Medical): No  Physical Activity: Not on file  Stress: Not on file  Social Connections: Not on file  Intimate Partner Violence: Not on file    Current Outpatient Medications on File Prior to Visit  Medication Sig Dispense Refill   Accu-Chek FastClix Lancets MISC 1 each by Other route in the morning and at bedtime.     acetaminophen (TYLENOL) 325 MG tablet Take 2 tablets (650 mg total) by mouth every 4 (four) hours as needed for mild pain (or temp > 37.5 C (99.5 F)).     albuterol (VENTOLIN HFA) 108 (90 Base) MCG/ACT inhaler Inhale 2 puffs into the lungs every 6 (six) hours as needed for wheezing or shortness of breath.     Alcohol Swabs (ALCOHOL PREP) 70 % PADS 1 each by Other route in the morning, at noon, and at bedtime.     allopurinol (ZYLOPRIM) 100 MG tablet Take 200 mg by mouth daily.     apixaban (ELIQUIS) 5 MG TABS tablet Take 1 tablet (5 mg total) by mouth 2 (two) times daily. 180 tablet 2   ARIPiprazole (ABILIFY) 5 MG tablet TAKE ONE TABLET BY MOUTH EVERYDAY AT BEDTIME 90 tablet 3   atorvastatin (LIPITOR) 40 MG tablet Take 1 tablet (40 mg total) by mouth every evening. TAKE ONE TABLET BY MOUTH DAILY AT 6PM 90 tablet 3   B-D ULTRAFINE III SHORT PEN 31G X 8 MM MISC 1 each by Other route See admin instructions. Use to inject insulin subcutaneously at bedtime as directed.     blood glucose meter kit and supplies Dispense based on patient and insurance preference. Use up to four times daily as directed. (FOR ICD-10 E10.9, E11.9). 1 each 0   busPIRone (BUSPAR) 5 MG tablet Take 1 tablet (5 mg total) by mouth 3 (three) times daily. 270 tablet 3   cetirizine (ZYRTEC) 10 MG tablet Take 10 mg by mouth at bedtime.      Cholecalciferol (VITAMIN D3 PO) Take 2,000 Units by mouth daily.     diltiazem (CARDIZEM CD) 180 MG 24 hr capsule Take 1 capsule (180 mg total)  by mouth daily. 90 capsule 3   FARXIGA 10 MG TABS tablet TAKE ONE TABLET BY MOUTH BEFORE BREAKFAST 90 tablet 3   ferrous sulfate 324 (65 Fe) MG TBEC Take 324 mg by mouth daily with breakfast.      fluticasone (FLONASE) 50 MCG/ACT nasal spray Place 2 sprays into both nostrils daily.      isosorbide mononitrate (IMDUR) 30 MG 24 hr tablet TAKE ONE TABLET BY MOUTH EVERY EVENING 90 tablet 3   levETIRAcetam (  KEPPRA) 500 MG tablet Take 1 tablet (500 mg total) by mouth 2 (two) times daily. 120 tablet 11   metFORMIN (GLUCOPHAGE-XR) 500 MG 24 hr tablet Take 4 tablets (2,000 mg total) by mouth daily. 360 tablet 3   omeprazole (PRILOSEC) 20 MG capsule Take 20 mg by mouth every morning.     Oxycodone HCl 10 MG TABS Take 10 mg by mouth as needed.     potassium chloride SA (KLOR-CON M) 20 MEQ tablet Take 2 tablets (40 mEq total) by mouth 2 (two) times daily. 270 tablet 3   prazosin (MINIPRESS) 2 MG capsule TAKE TWO CAPSULES BY MOUTH EVERYDAY AT BEDTIME 180 capsule 3   pregabalin (LYRICA) 150 MG capsule Take 1 capsule (150 mg total) by mouth 3 (three) times daily. Please discuss future refills with your PCP. 270 capsule 1   repaglinide (PRANDIN) 2 MG tablet Take 2 tablets (4 mg total) by mouth 2 (two) times daily before a meal. 360 tablet 3   spironolactone (ALDACTONE) 25 MG tablet Take 1 tablet (25 mg total) by mouth daily. 90 tablet 2   temazepam (RESTORIL) 15 MG capsule TAKE ONE CAPSULE BY MOUTH EVERYDAY AT BEDTIME 30 capsule 2   Tiotropium Bromide-Olodaterol (STIOLTO RESPIMAT) 2.5-2.5 MCG/ACT AERS Inhale 2 puffs into the lungs daily. 1 each 3   torsemide (DEMADEX) 20 MG tablet Take 4 tablets (80 mg total) by mouth every morning AND 3 tablets (60 mg total) every evening. 450 tablet 3   traZODone (DESYREL) 100 MG tablet Take 2 tablets (200 mg total) by mouth at bedtime. Take 200 mg by mouth at bedtime. 885 tablet 3   TRULICITY 4.5 OY/7.7AJ SOPN INJECT 4.15m into THE SKIN ONCE A WEEK 6 mL 3   venlafaxine XR  (EFFEXOR-XR) 75 MG 24 hr capsule Take 3 capsules (225 mg total) by mouth daily with breakfast. 270 capsule 3   calcium carbonate (TUMS - DOSED IN MG ELEMENTAL CALCIUM) 500 MG chewable tablet Chew 1 tablet by mouth daily as needed for indigestion or heartburn.     metoprolol succinate (TOPROL-XL) 50 MG 24 hr tablet Take 1 tablet (50 mg total) by mouth daily. Take with or immediately following a meal. 90 tablet 2   No current facility-administered medications on file prior to visit.    Allergies  Allergen Reactions   Vancomycin Other (See Comments)    "Red Man Syndrome" 02/02/17: possible cause for rash under both arms   Niacin Other (See Comments)   Niacin And Related Other (See Comments)    Red man syndrome   Tubersol [Tuberculin, Ppd] Other (See Comments)    Reaction unknown   Doxycycline Rash and Other (See Comments)    Family History  Problem Relation Age of Onset   CAD Maternal Grandfather    Diabetes Other    Diabetes Mellitus II Neg Hx    Colon cancer Neg Hx    Esophageal cancer Neg Hx    Inflammatory bowel disease Neg Hx    Liver disease Neg Hx    Pancreatic cancer Neg Hx    Rectal cancer Neg Hx    Stomach cancer Neg Hx    Sleep apnea Neg Hx     BP 110/60    Pulse (!) 102    Ht _0  (1.88 m)    SpO2 94%    BMI 48.79 kg/m    Review of Systems     Objective:   Physical Exam VITAL SIGNS:  See vs page GENERAL: no distress.  In WC.  EXT: 1+ bilat leg edema.    Lab Results  Component Value Date   HGBA1C 8.0 (A) 10/14/2021       Assessment & Plan:  Type 2 DM: uncontrolled.   Patient Instructions  check your blood sugar twice a day.  vary the time of day when you check, between before the 3 meals, and at bedtime.  also check if you have symptoms of your blood sugar being too high or too low.  please keep a record of the readings and bring it to your next appointment here (or you can bring the meter itself).  You can write it on any piece of paper.  please call  us sooner if your blood sugar goes below 70, or if most of your readings are over 200.  Please add "bromocriptine," to help your blood sugar. It has possible side effects of nausea and dizziness.  These go away with time.  You can avoid these by taking it at bedtime, and by taking just take 1/2 pill for the first week.   Please continue the same other diabetes medications.   Please come back for a follow-up appointment in 2 months.

## 2021-10-18 ENCOUNTER — Other Ambulatory Visit: Payer: Self-pay

## 2021-10-18 ENCOUNTER — Ambulatory Visit (INDEPENDENT_AMBULATORY_CARE_PROVIDER_SITE_OTHER): Payer: Medicare HMO | Admitting: Pulmonary Disease

## 2021-10-18 ENCOUNTER — Encounter: Payer: Self-pay | Admitting: Pulmonary Disease

## 2021-10-18 VITALS — BP 138/76 | HR 91 | Temp 98.2°F | Ht 74.0 in | Wt 376.0 lb

## 2021-10-18 DIAGNOSIS — R6889 Other general symptoms and signs: Secondary | ICD-10-CM | POA: Diagnosis not present

## 2021-10-18 DIAGNOSIS — J4540 Moderate persistent asthma, uncomplicated: Secondary | ICD-10-CM | POA: Diagnosis not present

## 2021-10-18 MED ORDER — TRELEGY ELLIPTA 200-62.5-25 MCG/ACT IN AEPB: 1.0000 | INHALATION_SPRAY | Freq: Every day | RESPIRATORY_TRACT | 11 refills | Status: DC

## 2021-10-18 MED ORDER — TRELEGY ELLIPTA 200-62.5-25 MCG/ACT IN AEPB: 1.0000 | INHALATION_SPRAY | Freq: Every day | RESPIRATORY_TRACT | 0 refills | Status: DC

## 2021-10-18 NOTE — Addendum Note (Signed)
Addended by: Retia Passe on: 10/18/2021 05:29 PM ? ? Modules accepted: Orders ? ?

## 2021-10-18 NOTE — Patient Instructions (Signed)
Nice to see you again ? ?I am glad the Stiolto was helpful ? ?With some mild worsening symptoms with the recent pollen, I recommend we escalate her increased strength of your inhaler. ? ?Stop Stiolto, start Trelegy 1 puff once a day.  Rinse your mouth out after every use.  Please call us if this is too expensive.  We have provided samples today. ? ?Return to clinic in 3 months or sooner as needed with Dr. Silas Flood ?

## 2021-10-18 NOTE — Progress Notes (Signed)
$'@Patient'w$  ID: Lawrence [name], male    DOB: [birth year], 66 y.o.   MRN: [id number]  Chief Complaint  Patient presents with   Follow-up    3 month follow up. Pt states he still has SOB. Pt states the inhaler is working well. Pt states he has to usde the albuterol in the evening more due to the weather     Referring provider: [name], PA-C  HPI:   66 y.o. man whom we are seeing in consultation for evaluation of dyspnea on exertion.  Most recent cardiology note x2 reviewed.  Discharge summary 08/2021 reviewed.  Patient returns for routine follow-up.  Since last visit he was hospitalized for pneumonia and CHF exacerbation.  Treated with antibiotics and diuresis.  Intermittent volume overload.  TEE demonstrated moderate aortic valve stenosis, there was concern for severe stenosis.  A CardioMEMS was placed later in the month 08/2021.  Close follow-up with cardiology has been arranged.  At last visit, there was concern of his dyspnea being contributed by tobacco abuse or history thereof.  He was started on Stiolto.  This seems to have improved symptoms a bit.  PFTs were ordered at last visit, these have not been performed.  Reviewed most recent chest x-ray 08/11/2021 that demonstrates clear lungs on my interpretation, when compared to chest x-ray on admission 08/07/2021, mild improvement in lower lobe hazy infiltrates likely reflective of pulmonary edema on my interpretation.  Today he complains of a bit worse chest tightness, mainly in the afternoons.  He is never about once a day.  This does provide relief.  Worse since pollens started, now.  HPI at initial visit: Patient reports several months more likely over a year history of dyspnea on exertion.  Suspect longer than this.  Worse on inclines or stairs.  Present on flat surfaces as well.  No time of day when things are better or worse.  No position that makes things better or worse.  No seasonal environmental factors he can identify to make  things better or worse.  No alleviating or exacerbating factors other than rest.  Patient feels more short of breath when in atrial fibrillation.  Endorses worsening lower extremity swelling when he feels more short of breath.  Reviewed most recent chest x-ray 05/28/2021 demonstrates interstitial edema on my interpretation.  Reviewed most recent echocardiogram fall 2022 demonstrates diastolic dysfunction.  PMH: Diabetes, hypertension, atrial fibrillation, diastolic dysfunction Surgical history: Knee replacement, hernia repair Family history: Denies significant respiratory illness in first-degree relative Social history: Former smoker, 40+ pack year, quit 2017, lives in [place] / Pulmonary Flowsheets:   ACT:  No flowsheet data found.  MMRC: No flowsheet data found.  Epworth:  No flowsheet data found.  Tests:   FENO:  No results found for: NITRICOXIDE  PFT: No flowsheet data found.  WALK:  No flowsheet data found.  Imaging: Personally reviewed and as per EMR and discussion this note was  Lab Results: Personally reviewed CBC    Component Value Date/Time   WBC 13.3 (H) 09/26/2021 1313   RBC 4.91 09/26/2021 1313   HGB 14.2 09/26/2021 1313   HCT 44.5 09/26/2021 1313   PLT 282 09/26/2021 1313   MCV 90.6 09/26/2021 1313   MCH 28.9 09/26/2021 1313   MCHC 31.9 09/26/2021 1313   RDW 14.9 09/26/2021 1313   LYMPHSABS 1.6 09/26/2021 1313   MONOABS 0.7 09/26/2021 1313   EOSABS 0.5 09/26/2021 1313   BASOSABS 0.1 09/26/2021 1313    BMET  Component Value Date/Time   NA 140 08/26/2021 1337   NA 140 08/26/2021 1337   NA 136 01/07/2021 0000   K 3.6 08/26/2021 1337   K 3.6 08/26/2021 1337   CL 98 08/19/2021 1300   CO2 28 08/19/2021 1300   GLUCOSE 114 (H) 08/19/2021 1300   BUN 18 08/19/2021 1300   BUN 22 01/07/2021 0000   CREATININE 0.84 08/19/2021 1300   CALCIUM 9.5 08/19/2021 1300   GFRNONAA >60 08/19/2021 1300   GFRAA 77 05/20/2020 1038    BNP     Component Value Date/Time   BNP 37.0 08/19/2021 1300    ProBNP No results found for: PROBNP  Specialty Problems       Pulmonary Problems   OSA (obstructive sleep apnea)   Acute respiratory failure with hypoxia (HCC)   Shortness of breath   COPD (chronic obstructive pulmonary disease) (HCC)   Pneumonia    Allergies  Allergen Reactions   Vancomycin Other (See Comments)    "Red Man Syndrome" 02/02/17: possible cause for rash under both arms   Niacin Other (See Comments)   Niacin And Related Other (See Comments)    Red man syndrome   Tubersol [Tuberculin, Ppd] Other (See Comments)    Reaction unknown   Doxycycline Rash and Other (See Comments)    Immunization History  Administered Date(s) Administered   Influenza Split 04/13/2015, 05/23/2016   Influenza,inj,Quad PF,6+ Mos 08/16/2017, 06/22/2018, 04/04/2019   Influenza-Unspecified 06/28/2007, 06/22/2018, 05/09/2019   Moderna Sars-Covid-2 Vaccination 10/16/2018, 10/16/2019, 11/13/2019, 06/12/2020, 12/24/2020   Pneumococcal Polysaccharide-23 09/16/2015, 10/18/2020   Tdap 09/16/2015    Past Medical History:  Diagnosis Date   Acquired dilation of ascending aorta and aortic root ([place])    20mm by echo 01/2021   Adenomatous colon polyp 2007   Anemia    Anxiety    Aortic stenosis    moderate AS by echo 01/2021   Asthma    BPH without obstruction/lower urinary tract symptoms 02/22/2017   Chronic diastolic (congestive) heart failure (HCC)    Chronic venous stasis 03/07/2019   COPD (chronic obstructive pulmonary disease) (HCC)    Coronary artery calcification seen on CAT scan    Depression    Diabetic neuropathy ([place]) 09/11/2019   History of colon polyps 08/24/2018   Hypertension    Morbid obesity (HCC)    OSA (obstructive sleep apnea)    Pain due to onychomycosis of toenails of both feet 09/11/2019   Peripheral neuropathy 02/22/2017   Primary osteoarthritis, left shoulder 03/05/2017   PTSD (post-traumatic stress  disorder)    Pure hypercholesterolemia    QT prolongation 03/07/2019   Seizures (HCC)    Shortness of breath    Sinus tachycardia 03/07/2019   Sleep apnea    CPAP   Type 2 diabetes mellitus with vascular disease ([place]) 09/11/2019    Tobacco History: Social History   Tobacco Use  Smoking Status Former   Packs/day: 1.00   Years: 44.00   Pack years: 44.00   Types: Cigarettes   Quit date: 03/07/2016   Years since quitting: 5.6   Passive exposure: Past  Smokeless Tobacco Never   Counseling given: Not Answered   Continue to not smoke  Outpatient Encounter Medications as of 10/18/2021  Medication Sig   Accu-Chek FastClix Lancets MISC 1 each by Other route in the morning and at bedtime.   acetaminophen (TYLENOL) 325 MG tablet Take 2 tablets (650 mg total) by mouth every 4 (four) hours as needed for mild pain (or temp >  37.5 C (99.5 F)).   albuterol (VENTOLIN HFA) 108 (90 Base) MCG/ACT inhaler Inhale 2 puffs into the lungs every 6 (six) hours as needed for wheezing or shortness of breath.   Alcohol Swabs (ALCOHOL PREP) 70 % PADS 1 each by Other route in the morning, at noon, and at bedtime.   allopurinol (ZYLOPRIM) 100 MG tablet Take 200 mg by mouth daily.   apixaban (ELIQUIS) 5 MG TABS tablet Take 1 tablet (5 mg total) by mouth 2 (two) times daily.   ARIPiprazole (ABILIFY) 5 MG tablet TAKE ONE TABLET BY MOUTH EVERYDAY AT BEDTIME   atorvastatin (LIPITOR) 40 MG tablet Take 1 tablet (40 mg total) by mouth every evening. TAKE ONE TABLET BY MOUTH DAILY AT 6PM   B-D ULTRAFINE III SHORT PEN 31G X 8 MM MISC 1 each by Other route See admin instructions. Use to inject insulin subcutaneously at bedtime as directed.   blood glucose meter kit and supplies Dispense based on patient and insurance preference. Use up to four times daily as directed. (FOR ICD-10 E10.9, E11.9).   bromocriptine (PARLODEL) 2.5 MG tablet Take 1 tablet (2.5 mg total) by mouth at bedtime.   busPIRone (BUSPAR) 5 MG tablet  Take 1 tablet (5 mg total) by mouth 3 (three) times daily.   cetirizine (ZYRTEC) 10 MG tablet Take 10 mg by mouth at bedtime.    Cholecalciferol (VITAMIN D3 PO) Take 2,000 Units by mouth daily.   diltiazem (CARDIZEM CD) 180 MG 24 hr capsule Take 1 capsule (180 mg total) by mouth daily.   FARXIGA 10 MG TABS tablet TAKE ONE TABLET BY MOUTH BEFORE BREAKFAST   ferrous sulfate 324 (65 Fe) MG TBEC Take 324 mg by mouth daily with breakfast.    fluticasone (FLONASE) 50 MCG/ACT nasal spray Place 2 sprays into both nostrils daily.    Fluticasone-Umeclidin-Vilant (TRELEGY ELLIPTA) 200-62.5-25 MCG/ACT AEPB Inhale 1 puff into the lungs daily.   isosorbide mononitrate (IMDUR) 30 MG 24 hr tablet TAKE ONE TABLET BY MOUTH EVERY EVENING   levETIRAcetam (KEPPRA) 500 MG tablet Take 1 tablet (500 mg total) by mouth 2 (two) times daily.   metFORMIN (GLUCOPHAGE-XR) 500 MG 24 hr tablet Take 4 tablets (2,000 mg total) by mouth daily.   omeprazole (PRILOSEC) 20 MG capsule Take 20 mg by mouth every morning.   Oxycodone HCl 10 MG TABS Take 10 mg by mouth as needed.   potassium chloride SA (KLOR-CON M) 20 MEQ tablet Take 2 tablets (40 mEq total) by mouth 2 (two) times daily.   prazosin (MINIPRESS) 2 MG capsule TAKE TWO CAPSULES BY MOUTH EVERYDAY AT BEDTIME   pregabalin (LYRICA) 150 MG capsule Take 1 capsule (150 mg total) by mouth 3 (three) times daily. Please discuss future refills with your PCP.   repaglinide (PRANDIN) 2 MG tablet Take 2 tablets (4 mg total) by mouth 2 (two) times daily before a meal.   spironolactone (ALDACTONE) 25 MG tablet Take 1 tablet (25 mg total) by mouth daily.   temazepam (RESTORIL) 15 MG capsule TAKE ONE CAPSULE BY MOUTH EVERYDAY AT BEDTIME   torsemide (DEMADEX) 20 MG tablet Take 4 tablets (80 mg total) by mouth every morning AND 3 tablets (60 mg total) every evening.   traZODone (DESYREL) 100 MG tablet Take 2 tablets (200 mg total) by mouth at bedtime. Take 200 mg by mouth at bedtime.    TRULICITY 4.5 TM/1.9QQ SOPN INJECT 4.$RemoveBefor'5mg'zOasXQqrOWLf$  into THE SKIN ONCE A WEEK   venlafaxine XR (EFFEXOR-XR) 75 MG 24 hr  capsule Take 3 capsules (225 mg total) by mouth daily with breakfast.   [DISCONTINUED] Tiotropium Bromide-Olodaterol (STIOLTO RESPIMAT) 2.5-2.5 MCG/ACT AERS Inhale 2 puffs into the lungs daily.   metoprolol succinate (TOPROL-XL) 50 MG 24 hr tablet Take 1 tablet (50 mg total) by mouth daily. Take with or immediately following a meal.   [DISCONTINUED] calcium carbonate (TUMS - DOSED IN MG ELEMENTAL CALCIUM) 500 MG chewable tablet Chew 1 tablet by mouth daily as needed for indigestion or heartburn.   No facility-administered encounter medications on file as of 10/18/2021.     Review of Systems  Review of Systems  N/a Physical Exam  BP 138/76 (BP Location: Left Arm, Patient Position: Sitting, Cuff Size: Normal)    Pulse 91    Temp 98.2 F (36.8 C) (Oral)    Ht $R'6\' 2"'fq$  (1.88 m)    Wt (!) 376 lb (170.6 kg)    SpO2 93%    BMI 48.28 kg/m   Wt Readings from Last 5 Encounters:  10/18/21 (!) 376 lb (170.6 kg)  09/23/21 (!) 380 lb (172.4 kg)  09/10/21 (!) 376 lb (170.6 kg)  08/26/21 (!) 384 lb 0.7 oz (174.2 kg)  08/19/21 (!) 384 lb (174.2 kg)    BMI Readings from Last 5 Encounters:  10/18/21 48.28 kg/m  10/14/21 48.79 kg/m  09/23/21 48.79 kg/m  09/10/21 48.28 kg/m  08/26/21 49.31 kg/m     Physical Exam  General: Obese, sitting in a chair Eyes: EOMI, icterus Neck: Supple, normal range of motion Pulmonary: Clear, normal work of breathing Cardiovascular: Irregular, no murmur Abdomen: Obese, nondistended MSK: No synovitis, no joint effusion Neuro: No weakness, sensation intact Psych: Normal mood, full affect with  Assessment & Plan:   Dyspnea on exertion/shortness of breath:   Likely multifactorial and largely related to fluid overload exacerbated atrial fibrillation given prior chest imaging.  Possible development of COPD given his history of cigarette smoking.  Likely  contribution of habitus causing extraparenchymal restriction.  Also likely history of deconditioning.  He does endorse seasonal allergies so asthma is also possibility.   Stiolto prescribed 07/2021 with mild improvement in symptoms, to continue.  PFTs ordered at last visit not obtained, will attempt to get those at next visit.  Asthma: Clinical diagnosis based on seasonal allergies, worsening breathing with pollens.  Improved Stiolto.  Escalate to Trelegy given worsening symptoms over the last few weeks.  Fluid overload due to congestive heart failure: Managed by cardiology, certainly contribute to his symptoms at time.  CardioMEMS device in place which hopefully will be helpful in terms of guiding diuretic therapy.    Return in about 3 months (around 01/18/2022).   Lanier Clam, MD 10/18/2021

## 2021-10-21 DIAGNOSIS — R6889 Other general symptoms and signs: Secondary | ICD-10-CM | POA: Diagnosis not present

## 2021-11-01 ENCOUNTER — Other Ambulatory Visit: Payer: Self-pay | Admitting: Pulmonary Disease

## 2021-11-25 ENCOUNTER — Ambulatory Visit: Payer: Medicare HMO | Admitting: Neurology

## 2021-11-25 ENCOUNTER — Encounter: Payer: Self-pay | Admitting: Neurology

## 2021-11-25 ENCOUNTER — Ambulatory Visit (INDEPENDENT_AMBULATORY_CARE_PROVIDER_SITE_OTHER): Payer: Medicare HMO | Admitting: Neurology

## 2021-11-25 VITALS — BP 116/69 | HR 88 | Ht 74.0 in | Wt 370.0 lb

## 2021-11-25 DIAGNOSIS — G4734 Idiopathic sleep related nonobstructive alveolar hypoventilation: Secondary | ICD-10-CM | POA: Diagnosis not present

## 2021-11-25 DIAGNOSIS — G4733 Obstructive sleep apnea (adult) (pediatric): Secondary | ICD-10-CM

## 2021-11-25 DIAGNOSIS — Z9989 Dependence on other enabling machines and devices: Secondary | ICD-10-CM

## 2021-11-25 DIAGNOSIS — R6889 Other general symptoms and signs: Secondary | ICD-10-CM | POA: Diagnosis not present

## 2021-11-25 NOTE — Progress Notes (Signed)
 Subjective:    Patient ID: Lawrence [name] is a 66 y.o. male.  HPI    Interim history:   [name] is a 66 year old right-handed gentleman with an underlying medical of aortic stenosis, COPD, depression, anxiety, anemia, diabetes, neuropathy, hypertension, PTSD, hyperlipidemia, history of seizure-like activity, sinus tachycardia, and morbid obesity with a BMI of over 40, who presents for follow-up consultation of his obstructive sleep apnea after interim home sleep testing and starting AutoPap therapy.  The patient is accompanied by his GF today.  I first met him at the request of Dr. Samara Crest on 05/04/2021, at which time the patient reported a prior diagnosis of obstructive sleep apnea.  He was on BiPAP therapy but a download was not available at the time.  He is advised to proceed with a sleep study.  He had a home sleep test on 05/24/2021 which indicated severe obstructive sleep apnea as well as nocturnal hypoxemia.  AHI was 81.7/h, O2 nadir 73%.  His average oxygen saturation was 87% only.  He was advised of the test results and advised to talk to his primary care physician about seeing a pulmonologist for his underlying COPD.  He was advised that he may need supplemental oxygen.  He was advised to start AutoPap therapy.  His set up date was 09/29/2021.  He has a AirSense 11 AutoSet machine from [employer].  Today, 11/25/2021: I reviewed his AutoPap compliance data from 10/25/2021 through 11/23/2021, which is a total of 30 days, during which time he used his machine every night with percent use days greater than 4 hours at 70%, indicating adequate compliance with an average usage of 4 hours and 54 minutes, residual AHI 1/h, at goal, average pressure for the 95th percentile at 11.6 cm with a range of 7 to 18 cm with EPR, leak on the higher side with the 95th percentile at 25 L/min.  He reports doing well, has adjusted well to AutoPap therapy and is compliant with treatment.  He is followed with pulmonology,  Dr. Marygrace Snellen and has an appointment pending.  They recommended no supplemental oxygen, he has not had an oxygen monitor while he has been on AutoPap therapy but generally feels better.  His girlfriend feels that he is better rested.  He has been on Trelegy once daily and has a rescue inhaler which he has rarely used.  He tries to hydrate well but should limit his fluids to less than a liter per day as I understand.  This is per cardiology he reports.  The patient's allergies, current medications, family history, past medical history, past social history, past surgical history and problem list were reviewed and updated as appropriate.   Previously:   05/04/21: (He) was previously diagnosed with obstructive sleep apnea and placed on BiPAP therapy.  Prior sleep study results are not available for my review today, study was done through the [place] about 10 years ago.  I reviewed your office note from 04/22/2021.  He reports nonrestorative sleep and daytime somnolence.  I was not able to review his BiPAP compliance data, as he did not bring the machine.  He reports that he got this machine about 4 years ago, also through the [place]. He reports significant daytime somnolence.  His Epworth sleepiness score is 21 out of 24.  Of note, he is on multiple potentially sedating medications including Temazepam , trazodone , Abilify , metoprolol , oxycodone , Lyrica .  He was recently hospitalized from 04/26/2021 through 04/28/2021.  He was admitted due to slurring of speech and  left-sided weakness.  I reviewed the hospital records.  He was felt to be at risk for polypharmacy given his medication regimen and was encouraged to discuss his medications with his prescribers.  He was found to have a low vitamin B12 and was advised to start oral B12 supplementation and discuss B12 injection. He lives with his SO, Lawrence [name], they have no pets at the house.  He goes to bed around 10 PM and it is not hard for him to fall asleep but he has difficulty  maintaining sleep, he reports that he wakes up about 2 hours later and goes to the living room.  He tries to be consistent with his BiPAP but does not indicate that he takes his machine into the living room when he sleeps there.  He watches TV in the living room and then falls asleep without warning.  He drinks caffeine in the form of coffee, about 2 cups/day, rare alcohol .   He had a tonsillectomy at age 45. He was recently treated for cellulitis with antibiotics. His primary care PA prescribes his antidepressant medication, his pain medication, and his medications for sleep.  Of note, he takes 200 mg of trazodone  at night as well as temazepam  at night. He is on high-dose Effexor , 225 mg daily, Abilify  5 mg at bedtime, BuSpar  5 mg 3 times daily, oxycodone  10 mg up to 3 times a day as needed, Lyrica  150 mg 3 times a day.  His Past Medical History Is Significant For: Past Medical History:  Diagnosis Date   Acquired dilation of ascending aorta and aortic root (HCC)    40mm by echo 01/2021   Adenomatous colon polyp 2007   Anemia    Anxiety    Aortic stenosis    moderate AS by echo 01/2021   Asthma    BPH without obstruction/lower urinary tract symptoms 02/22/2017   Chronic diastolic (congestive) heart failure (HCC)    Chronic venous stasis 03/07/2019   COPD (chronic obstructive pulmonary disease) (HCC)    Coronary artery calcification seen on CAT scan    Depression    Diabetic neuropathy (HCC) 09/11/2019   History of colon polyps 08/24/2018   Hypertension    Morbid obesity (HCC)    OSA (obstructive sleep apnea)    Pain due to onychomycosis of toenails of both feet 09/11/2019   Peripheral neuropathy 02/22/2017   Primary osteoarthritis, left shoulder 03/05/2017   PTSD (post-traumatic stress disorder)    Pure hypercholesterolemia    QT prolongation 03/07/2019   Seizures (HCC)    Shortness of breath    Sinus tachycardia 03/07/2019   Sleep apnea    CPAP   Type 2 diabetes mellitus with  vascular disease (HCC) 09/11/2019    His Past Surgical History Is Significant For: Past Surgical History:  Procedure Laterality Date   ENDOVENOUS ABLATION SAPHENOUS VEIN W/ LASER Right 08/20/2020   endovenous laser ablation right greater saphenous vein by Kirtland Perfect MD    JOINT REPLACEMENT     left knee replacement x 2   KNEE ARTHROSCOPY Bilateral    LEFT HEART CATH AND CORONARY ANGIOGRAPHY N/A 01/17/2018   Procedure: LEFT HEART CATH AND CORONARY ANGIOGRAPHY;  Surgeon: Arleen Lacer, MD;  Location: [facility] INVASIVE CV LAB;  Service: Cardiovascular;  Laterality: N/A;   PRESSURE SENSOR/CARDIOMEMS N/A 08/26/2021   Procedure: PRESSURE SENSOR/CARDIOMEMS;  Surgeon: Darlis Eisenmenger, MD;  Location: [facility] INVASIVE CV LAB;  Service: Cardiovascular;  Laterality: N/A;   RIGHT HEART CATH N/A 08/26/2021  Procedure: RIGHT HEART CATH;  Surgeon: Darlis Eisenmenger, MD;  Location: [facility] INVASIVE CV LAB;  Service: Cardiovascular;  Laterality: N/A;   TEE WITHOUT CARDIOVERSION N/A 08/26/2021   Procedure: TRANSESOPHAGEAL ECHOCARDIOGRAM (TEE);  Surgeon: Darlis Eisenmenger, MD;  Location: [facility] ENDOSCOPY;  Service: Cardiovascular;  Laterality: N/A;   UMBILICAL HERNIA REPAIR      His Family History Is Significant For: Family History  Problem Relation Age of Onset   CAD Maternal Grandfather    Diabetes Other    Diabetes Mellitus II Neg Hx    Colon cancer Neg Hx    Esophageal cancer Neg Hx    Inflammatory bowel disease Neg Hx    Liver disease Neg Hx    Pancreatic cancer Neg Hx    Rectal cancer Neg Hx    Stomach cancer Neg Hx    Sleep apnea Neg Hx     His Social History Is Significant For: Social History   Socioeconomic History   Marital status: Significant Other    Spouse name: Brenita Callow   Number of children: 1   Years of education: Not on file   Highest education level: Associate degree: academic program  Occupational History   Occupation: retired    Comment: Building control surveyor  Tobacco Use   Smoking  status: Former    Packs/day: 1.00    Years: 44.00    Pack years: 44.00    Types: Cigarettes    Quit date: 03/07/2016    Years since quitting: 5.7    Passive exposure: Past   Smokeless tobacco: Never  Vaping Use   Vaping Use: Never used  Substance and Sexual Activity   Alcohol  use: Yes    Comment: rare   Drug use: No   Sexual activity: Not on file  Other Topics Concern   Not on file  Social History Narrative   Lives with wife Lawrence [name]   Right Handed   Drinks 1-2 cups caffeine   Social Determinants of Health   Financial Resource Strain: Medium Risk   Difficulty of Paying Living Expenses: Somewhat hard  Food Insecurity: Food Insecurity Present   Worried About Programme researcher, broadcasting/film/video in the Last Year: Sometimes true   Ran Out of Food in the Last Year: Never true  Transportation Needs: Unmet Transportation Needs   Lack of Transportation (Medical): Yes   Lack of Transportation (Non-Medical): No  Physical Activity: Not on file  Stress: Not on file  Social Connections: Not on file    His Allergies Are:  Allergies  Allergen Reactions   Vancomycin  Other (See Comments)    "Red Man Syndrome" 02/02/17: possible cause for rash under both arms   Niacin Other (See Comments)   Niacin And Related Other (See Comments)    Red man syndrome   Tubersol [Tuberculin, Ppd] Other (See Comments)    Reaction unknown   Doxycycline  Rash and Other (See Comments)  :   His Current Medications Are:  Outpatient Encounter Medications as of 11/25/2021  Medication Sig   Accu-Chek FastClix Lancets MISC 1 each by Other route in the morning and at bedtime.   acetaminophen  (TYLENOL ) 325 MG tablet Take 2 tablets (650 mg total) by mouth every 4 (four) hours as needed for mild pain (or temp > 37.5 C (99.5 F)).   albuterol  (VENTOLIN  HFA) 108 (90 Base) MCG/ACT inhaler Inhale 2 puffs into the lungs every 6 (six) hours as needed for wheezing or shortness of breath.   Alcohol  Swabs (ALCOHOL  PREP) 70 % PADS  1 each by  Other route in the morning, at noon, and at bedtime.   allopurinol  (ZYLOPRIM ) 100 MG tablet Take 200 mg by mouth daily.   apixaban  (ELIQUIS ) 5 MG TABS tablet Take 1 tablet (5 mg total) by mouth 2 (two) times daily.   ARIPiprazole  (ABILIFY ) 5 MG tablet TAKE ONE TABLET BY MOUTH EVERYDAY AT BEDTIME   atorvastatin  (LIPITOR) 40 MG tablet Take 1 tablet (40 mg total) by mouth every evening. TAKE ONE TABLET BY MOUTH DAILY AT 6PM   B-D ULTRAFINE III SHORT PEN 31G X 8 MM MISC 1 each by Other route See admin instructions. Use to inject insulin  subcutaneously at bedtime as directed.   blood glucose meter kit and supplies Dispense based on patient and insurance preference. Use up to four times daily as directed. (FOR ICD-10 E10.9, E11.9).   bromocriptine  (PARLODEL ) 2.5 MG tablet Take 1 tablet (2.5 mg total) by mouth at bedtime.   busPIRone  (BUSPAR ) 5 MG tablet Take 1 tablet (5 mg total) by mouth 3 (three) times daily.   cetirizine (ZYRTEC) 10 MG tablet Take 10 mg by mouth at bedtime.    Cholecalciferol  (VITAMIN D3 PO) Take 2,000 Units by mouth daily.   diltiazem  (CARDIZEM  CD) 180 MG 24 hr capsule Take 1 capsule (180 mg total) by mouth daily.   FARXIGA  10 MG TABS tablet TAKE ONE TABLET BY MOUTH BEFORE BREAKFAST   ferrous sulfate  324 (65 Fe) MG TBEC Take 324 mg by mouth daily with breakfast.    fluticasone  (FLONASE ) 50 MCG/ACT nasal spray Place 2 sprays into both nostrils daily.    Fluticasone -Umeclidin-Vilant (TRELEGY ELLIPTA ) 200-62.5-25 MCG/ACT AEPB Inhale 1 puff into the lungs daily.   Fluticasone -Umeclidin-Vilant (TRELEGY ELLIPTA ) 200-62.5-25 MCG/ACT AEPB Inhale 1 puff into the lungs daily.   isosorbide  mononitrate (IMDUR ) 30 MG 24 hr tablet TAKE ONE TABLET BY MOUTH EVERY EVENING   levETIRAcetam  (KEPPRA ) 500 MG tablet Take 1 tablet (500 mg total) by mouth 2 (two) times daily.   metFORMIN  (GLUCOPHAGE -XR) 500 MG 24 hr tablet Take 4 tablets (2,000 mg total) by mouth daily.   omeprazole (PRILOSEC) 20 MG  capsule Take 20 mg by mouth every morning.   Oxycodone  HCl 10 MG TABS Take 10 mg by mouth as needed.   potassium chloride  SA (KLOR-CON  M) 20 MEQ tablet Take 2 tablets (40 mEq total) by mouth 2 (two) times daily.   prazosin  (MINIPRESS ) 2 MG capsule TAKE TWO CAPSULES BY MOUTH EVERYDAY AT BEDTIME   pregabalin  (LYRICA ) 150 MG capsule Take 1 capsule (150 mg total) by mouth 3 (three) times daily. Please discuss future refills with your PCP.   repaglinide  (PRANDIN ) 2 MG tablet Take 2 tablets (4 mg total) by mouth 2 (two) times daily before a meal.   spironolactone  (ALDACTONE ) 25 MG tablet Take 1 tablet (25 mg total) by mouth daily.   temazepam  (RESTORIL ) 15 MG capsule TAKE ONE CAPSULE BY MOUTH EVERYDAY AT BEDTIME   torsemide  (DEMADEX ) 20 MG tablet Take 4 tablets (80 mg total) by mouth every morning AND 3 tablets (60 mg total) every evening.   traZODone  (DESYREL ) 100 MG tablet Take 2 tablets (200 mg total) by mouth at bedtime. Take 200 mg by mouth at bedtime.   TRULICITY  4.5 MG/0.5ML SOPN INJECT 4.5mg  into THE SKIN ONCE A WEEK   venlafaxine  XR (EFFEXOR -XR) 75 MG 24 hr capsule Take 3 capsules (225 mg total) by mouth daily with breakfast.   metoprolol  succinate (TOPROL -XL) 50 MG 24 hr tablet Take 1 tablet (50  mg total) by mouth daily. Take with or immediately following a meal.   No facility-administered encounter medications on file as of 11/25/2021.  :  Review of Systems:  Out of a complete 14 point review of systems, all are reviewed and negative with the exception of these symptoms as listed below:  Review of Systems  Neurological:        Pt is here for CPAP follow up Pt states he does well with CPAP machine and he has no questions ir concerns for todays visit   Objective:  Neurological Exam  Physical Exam Physical Examination:   Vitals:   11/25/21 1259  BP: 116/69  Pulse: 88    General Examination: The patient is a very pleasant 66 y.o. male in no acute distress. He appears  well-developed and well-nourished and well groomed.   HEENT: Normocephalic, atraumatic, pupils are equal, round and reactive to light, tracking well-preserved, corrective eyeglasses in place.  Speech is clear without dysarthria, hypophonia or voice tremor.  Neck without carotid bruits.  Airway examination reveals a small airway, redundant soft palate.  Mallampati class III.  Tonsils absent.  Moderate to severe mouth dryness noted.  Tongue protrudes centrally and palate elevates symmetrically.    Chest: Clear to auscultation without wheezing, rhonchi or crackles noted.   Heart: S1+S2+0, regular and normal without murmurs, rubs or gallops noted.    Abdomen: Soft, non-tender and non-distended.   Extremities: There is swelling of both lower extremities, right more than left, mild erythema noted.     Skin: Warm and dry with dystrophic toenails.      Musculoskeletal: exam reveals scar left knee.     Neurologically:  Mental status: The patient is awake, alert and oriented in all 4 spheres. His immediate and remote memory, attention, language skills and fund of knowledge are appropriate. Thought process is linear. Mood is normal and affect is normal.  Cranial nerves II - XII are as described above under HEENT exam.  Motor exam: Normal bulk, able to move all 4 extremities.  Fine motor skills are grossly intact in the upper extremities.  I did not have him stand or walk for me today.  Uses electric scooter.                Assessment and Plan:  In summary, Janmichael Ellett is a very pleasant 66 year old male with an underlying medical of aortic stenosis, COPD, depression, anxiety, anemia, diabetes, neuropathy, hypertension, PTSD, hyperlipidemia, history of seizure-like activity, sinus tachycardia, and morbid obesity with a BMI of over 40, who presents for follow up consultation of his obstructive sleep apnea.  He carries a prior diagnosis of sleep apnea and had testing over 10 years ago through the [place].  He  previously had a BiPAP machine.  He had reevaluation with a home sleep test on 05/24/2021 which indicated severe obstructive sleep apnea with an AHI of 81.7/h, O2 nadir 73%.  Average oxygen saturation was only 87% at the time.  He is advised to continue with his AutoPap, apnea scores are at goal, leak acceptable for the most part.  He is compliant with treatment and is commended for his treatment adherence.  He is followed regularly by pulmonology and they have not recommended any supplemental oxygen.  He is encouraged to double check if he should be evaluated for oxygen desaturations while on AutoPap therapy with an overnight oximetry test but I will leave this up to the expertise of his pulmonologist.  For now, his apnea is  well controlled per download and he is motivated to continue with the current machine.  Settings are fine and he is tolerating the full facemask which he is used to using.  He is advised to follow-up in sleep clinic to see one of our nurse practitioners routinely in 1 year.  I answered all their questions today and the patient and his girlfriend were in agreement.  I spent 30 minutes in total face-to-face time and in reviewing records during pre-charting, more than 50% of which was spent in counseling and coordination of care, reviewing test results, reviewing medications and treatment regimen and/or in discussing or reviewing the diagnosis of OSA, the prognosis and treatment options. Pertinent laboratory and imaging test results that were available during this visit with the patient were reviewed by me and considered in my medical decision making (see chart for details).

## 2021-11-25 NOTE — Patient Instructions (Signed)
It was nice to see you again, you are compliant with your autoPAP.  ? ?Please continue using your autoPAP regularly. While your insurance requires that you use PAP at least 4 hours each night on 70% of the nights, I recommend, that you not skip any nights and use it throughout the night if you can. Getting used to PAP and staying with the treatment long term does take time and patience and discipline. Untreated obstructive sleep apnea when it is moderate to severe can have an adverse impact on cardiovascular health and raise her risk for heart disease, arrhythmias, hypertension, congestive heart failure, stroke and diabetes. Untreated obstructive sleep apnea causes sleep disruption, nonrestorative sleep, and sleep deprivation. This can have an impact on your day to day functioning and cause daytime sleepiness and impairment of cognitive function, memory loss, mood disturbance, and problems focussing. Using PAP regularly can improve these symptoms. ? ?We can see you in 1 year, you can see one of our nurse practitioners as you are stable.  ? ?Please check with your pulmonologist at the next visit, if they recommend from their end of things an oximeter test overnight for evaluation of your oxygen saturations while on autoPAP therapy. It sounds like they would like to avoid putting you on supplemental oxygen, which I understand completely.  ?

## 2021-12-03 DIAGNOSIS — M47896 Other spondylosis, lumbar region: Secondary | ICD-10-CM | POA: Diagnosis not present

## 2021-12-03 DIAGNOSIS — R6889 Other general symptoms and signs: Secondary | ICD-10-CM | POA: Diagnosis not present

## 2021-12-03 DIAGNOSIS — M4726 Other spondylosis with radiculopathy, lumbar region: Secondary | ICD-10-CM | POA: Diagnosis not present

## 2021-12-03 DIAGNOSIS — G4730 Sleep apnea, unspecified: Secondary | ICD-10-CM | POA: Diagnosis not present

## 2021-12-03 DIAGNOSIS — I110 Hypertensive heart disease with heart failure: Secondary | ICD-10-CM | POA: Diagnosis not present

## 2021-12-03 DIAGNOSIS — Z6841 Body Mass Index (BMI) 40.0 and over, adult: Secondary | ICD-10-CM | POA: Diagnosis not present

## 2021-12-03 DIAGNOSIS — J449 Chronic obstructive pulmonary disease, unspecified: Secondary | ICD-10-CM | POA: Diagnosis not present

## 2021-12-03 DIAGNOSIS — M5416 Radiculopathy, lumbar region: Secondary | ICD-10-CM | POA: Diagnosis not present

## 2021-12-03 DIAGNOSIS — M160 Bilateral primary osteoarthritis of hip: Secondary | ICD-10-CM | POA: Diagnosis not present

## 2021-12-03 DIAGNOSIS — I10 Essential (primary) hypertension: Secondary | ICD-10-CM | POA: Diagnosis not present

## 2021-12-03 DIAGNOSIS — Z8673 Personal history of transient ischemic attack (TIA), and cerebral infarction without residual deficits: Secondary | ICD-10-CM | POA: Diagnosis not present

## 2021-12-03 DIAGNOSIS — I509 Heart failure, unspecified: Secondary | ICD-10-CM | POA: Diagnosis not present

## 2021-12-03 DIAGNOSIS — E1142 Type 2 diabetes mellitus with diabetic polyneuropathy: Secondary | ICD-10-CM | POA: Diagnosis not present

## 2021-12-06 DIAGNOSIS — R6889 Other general symptoms and signs: Secondary | ICD-10-CM | POA: Diagnosis not present

## 2021-12-06 DIAGNOSIS — Z79891 Long term (current) use of opiate analgesic: Secondary | ICD-10-CM | POA: Diagnosis not present

## 2021-12-06 DIAGNOSIS — E1142 Type 2 diabetes mellitus with diabetic polyneuropathy: Secondary | ICD-10-CM | POA: Diagnosis not present

## 2021-12-16 ENCOUNTER — Ambulatory Visit: Payer: Medicaid Other | Admitting: Neurology

## 2021-12-16 ENCOUNTER — Ambulatory Visit: Payer: No Typology Code available for payment source | Admitting: Endocrinology

## 2021-12-21 DIAGNOSIS — R6889 Other general symptoms and signs: Secondary | ICD-10-CM | POA: Diagnosis not present

## 2021-12-23 ENCOUNTER — Ambulatory Visit (HOSPITAL_COMMUNITY)
Admission: RE | Admit: 2021-12-23 | Discharge: 2021-12-23 | Disposition: A | Discharge status: Home / Self Care | Payer: Medicare HMO | Source: Ambulatory Visit | Attending: Cardiology | Admitting: Cardiology

## 2021-12-23 ENCOUNTER — Encounter (HOSPITAL_COMMUNITY): Payer: Self-pay | Admitting: Cardiology

## 2021-12-23 VITALS — BP 104/70 | HR 96 | Wt 372.0 lb

## 2021-12-23 DIAGNOSIS — E785 Hyperlipidemia, unspecified: Secondary | ICD-10-CM | POA: Diagnosis not present

## 2021-12-23 DIAGNOSIS — G4733 Obstructive sleep apnea (adult) (pediatric): Secondary | ICD-10-CM | POA: Diagnosis not present

## 2021-12-23 DIAGNOSIS — Z79899 Other long term (current) drug therapy: Secondary | ICD-10-CM | POA: Insufficient documentation

## 2021-12-23 DIAGNOSIS — I5032 Chronic diastolic (congestive) heart failure: Secondary | ICD-10-CM | POA: Diagnosis not present

## 2021-12-23 DIAGNOSIS — I480 Paroxysmal atrial fibrillation: Secondary | ICD-10-CM | POA: Insufficient documentation

## 2021-12-23 DIAGNOSIS — I110 Hypertensive heart disease with heart failure: Secondary | ICD-10-CM | POA: Diagnosis not present

## 2021-12-23 DIAGNOSIS — I350 Nonrheumatic aortic (valve) stenosis: Secondary | ICD-10-CM | POA: Insufficient documentation

## 2021-12-23 DIAGNOSIS — Z7901 Long term (current) use of anticoagulants: Secondary | ICD-10-CM | POA: Diagnosis not present

## 2021-12-23 LAB — BASIC METABOLIC PANEL
Anion gap: 8 (ref 5–15)
BUN: 19 mg/dL (ref 8–23)
CO2: 28 mmol/L (ref 22–32)
Calcium: 9 mg/dL (ref 8.9–10.3)
Chloride: 101 mmol/L (ref 98–111)
Creatinine, Ser: 1.02 mg/dL (ref 0.61–1.24)
GFR, Estimated: >60 mL/min (ref >60)
Glucose, Bld: 247 mg/dL — ABNORMAL HIGH (ref 70–99)
Potassium: 4.1 mmol/L (ref 3.5–5.1)
Sodium: 137 mmol/L (ref 135–145)

## 2021-12-23 LAB — LIPID PANEL
Cholesterol: 163 mg/dL (ref 0–200)
HDL: 35 mg/dL — ABNORMAL LOW (ref >40)
LDL Cholesterol: 62 mg/dL (ref 0–99)
Total CHOL/HDL Ratio: 4.7 RATIO
Triglycerides: 330 mg/dL — ABNORMAL HIGH (ref <150)
VLDL: 66 mg/dL — ABNORMAL HIGH (ref 0–40)

## 2021-12-23 NOTE — Patient Instructions (Signed)
There has been no changes to your medications. ? ?Labs done today, your results will be available in MyChart, we will contact you for abnormal readings. ? ? ?Your physician recommends that you schedule a follow-up appointment in: 3 months.   ? ?If you have any questions or concerns before your next appointment please send us a message through mychart or call our office at [phone].   ? ?TO LEAVE A MESSAGE FOR THE NURSE SELECT OPTION 2, PLEASE LEAVE A MESSAGE INCLUDING: ?YOUR NAME ?DATE OF BIRTH ?CALL BACK NUMBER ?REASON FOR CALL**this is important as we prioritize the call backs ? ?YOU WILL RECEIVE A CALL BACK THE SAME DAY AS LONG AS YOU CALL BEFORE 4:00 PM ? ?At the [facility], you and your health needs are our priority. As part of our continuing mission to provide you with exceptional heart care, we have created designated Provider Care Teams. These Care Teams include your primary Cardiologist (physician) and Advanced Practice Providers (APPs- Physician Assistants and Nurse Practitioners) who all work together to provide you with the care you need, when you need it.  ? ?You may see any of the following providers on your designated Care Team at your next follow up: ?Dr Daniel Bensimhon ?Dr Dalton McLean ?Amy Clegg, NP ?Brittainy Simmons, PA ?Jessica Milford,NP ?Lindsay Finch, PA ?Lauren Kemp, PharmD ? ? ?Please be sure to bring in all your medications bottles to every appointment.  ? ? ?

## 2021-12-23 NOTE — Progress Notes (Signed)
Patient ID: Lawrence [name], male   DOB: Oct 24, 1955, 66 y.o.   MRN: [id number]           Reason for Appointment: Type II Diabetes follow-up   History of Present Illness   Diagnosis date: 2018  Previous history: A1c in the past has ranged from 7.1-9.4  He has been on progressively increasing number of medications for his diabetes over the last 5 years or so Insulin was apparently used with small doses of insulin for 2 months in 2022 Usually A1c has not been below 7   Recent history:     Non-insulin hypoglycemic drugs: Farxiga 10 mg daily, metformin ER 2 g daily, Prandin 4 mg twice daily, Trulicity 4.5 mg weekly, started in 6/21     Insulin regimen:   None, stopped in 11/22    Side effects from medications: None  Current self management, blood sugar patterns and problems identified:  He checks his blood sugars somewhat sporadically and mostly in the mornings Despite persistently high blood sugars his insulin has not been restarted No recent labs available to objectively assess diabetes He is not checking after meals although lab glucose 247 postprandial in the morning He does not know what makes his blood sugars go up Last night even though he had only a [place] burger his blood sugar was over 200 this morning He has not had any improvement in his weight over the last few years  Exercise: Unable to, in wheelchair      Monitors blood glucose: Once a day.    Glucometer: One Touch.           Blood Glucose readings from meter download:   PRE-MEAL Fasting Lunch Dinner Bedtime Overall  Glucose range: 155-209      Mean/median:     159   POST-MEAL PC Breakfast PC Lunch PC Dinner  Glucose range:   ?  Mean/median:        Hypoglycemia:  none                        Dietician visit: Most recent: Years ago Weight control:  Wt Readings from Last 3 Encounters:  12/24/21 (!) 375 lb (170.1 kg)  12/23/21 (!) 372 lb (168.7 kg)  11/25/21 (!) 370 lb (167.8 kg)           Complications:   Vascular disease, neuropathy  Diabetes labs:  Lab Results  Component Value Date   HGBA1C 8.0 (A) 10/14/2021   HGBA1C 7.1 (A) 07/15/2021   HGBA1C 7.8 (H) 04/28/2021   Lab Results  Component Value Date   LDLCALC 62 12/23/2021   CREATININE 1.02 12/23/2021     Allergies as of 12/24/2021       Reactions   Vancomycin Other (See Comments)   "Red Man Syndrome" 02/02/17: possible cause for rash under both arms   Niacin Other (See Comments)   Niacin And Related Other (See Comments)   Red man syndrome   Tubersol [tuberculin, Ppd] Other (See Comments)   Reaction unknown   Doxycycline Rash, Other (See Comments)        Medication List        Accurate as of Dec 24, 2021  2:31 PM. If you have any questions, ask your nurse or doctor.          Accu-Chek FastClix Lancets Misc 1 each by Other route in the morning and at bedtime.   acetaminophen 325 MG tablet Commonly known as: TYLENOL Take 2  tablets (650 mg total) by mouth every 4 (four) hours as needed for mild pain (or temp > 37.5 C (99.5 F)).   albuterol 108 (90 Base) MCG/ACT inhaler Commonly known as: VENTOLIN HFA Inhale 2 puffs into the lungs every 6 (six) hours as needed for wheezing or shortness of breath.   Alcohol Prep 70 % Pads 1 each by Other route in the morning, at noon, and at bedtime.   allopurinol 100 MG tablet Commonly known as: ZYLOPRIM Take 200 mg by mouth daily.   apixaban 5 MG Tabs tablet Commonly known as: ELIQUIS Take 1 tablet (5 mg total) by mouth 2 (two) times daily.   ARIPiprazole 5 MG tablet Commonly known as: ABILIFY TAKE ONE TABLET BY MOUTH EVERYDAY AT BEDTIME   atorvastatin 40 MG tablet Commonly known as: LIPITOR Take 1 tablet (40 mg total) by mouth every evening. TAKE ONE TABLET BY MOUTH DAILY AT 6PM   B-D ULTRAFINE III SHORT PEN 31G X 8 MM Misc Generic drug: Insulin Pen Needle 1 each by Other route See admin instructions. Use to inject insulin subcutaneously at  bedtime as directed.   blood glucose meter kit and supplies Dispense based on patient and insurance preference. Use up to four times daily as directed. (FOR ICD-10 E10.9, E11.9).   bromocriptine 2.5 MG tablet Commonly known as: PARLODEL Take 1 tablet (2.5 mg total) by mouth at bedtime.   busPIRone 5 MG tablet Commonly known as: BUSPAR Take 1 tablet (5 mg total) by mouth 3 (three) times daily.   cetirizine 10 MG tablet Commonly known as: ZYRTEC Take 10 mg by mouth at bedtime.   diltiazem 180 MG 24 hr capsule Commonly known as: CARDIZEM CD Take 1 capsule (180 mg total) by mouth daily.   Farxiga 10 MG Tabs tablet Generic drug: dapagliflozin propanediol TAKE ONE TABLET BY MOUTH BEFORE BREAKFAST   ferrous sulfate 324 (65 Fe) MG Tbec Take 324 mg by mouth daily with breakfast.   fluticasone 50 MCG/ACT nasal spray Commonly known as: FLONASE Place 2 sprays into both nostrils daily.   isosorbide mononitrate 30 MG 24 hr tablet Commonly known as: IMDUR TAKE ONE TABLET BY MOUTH EVERY EVENING   levETIRAcetam 500 MG tablet Commonly known as: KEPPRA Take 1 tablet (500 mg total) by mouth 2 (two) times daily.   metFORMIN 500 MG 24 hr tablet Commonly known as: GLUCOPHAGE-XR Take 4 tablets (2,000 mg total) by mouth daily.   metoprolol succinate 50 MG 24 hr tablet Commonly known as: TOPROL-XL Take 1 tablet (50 mg total) by mouth daily. Take with or immediately following a meal.   omeprazole 20 MG capsule Commonly known as: PRILOSEC Take 20 mg by mouth every morning.   Oxycodone HCl 10 MG Tabs Take 10 mg by mouth as needed.   potassium chloride SA 20 MEQ tablet Commonly known as: KLOR-CON M Take 2 tablets (40 mEq total) by mouth 2 (two) times daily.   prazosin 2 MG capsule Commonly known as: MINIPRESS TAKE TWO CAPSULES BY MOUTH EVERYDAY AT BEDTIME   pregabalin 150 MG capsule Commonly known as: LYRICA Take 1 capsule (150 mg total) by mouth 3 (three) times daily. Please  discuss future refills with your PCP.   repaglinide 2 MG tablet Commonly known as: PRANDIN Take 2 tablets (4 mg total) by mouth 2 (two) times daily before a meal.   spironolactone 25 MG tablet Commonly known as: ALDACTONE Take 1 tablet (25 mg total) by mouth daily.   temazepam 15 MG capsule  Commonly known as: RESTORIL TAKE ONE CAPSULE BY MOUTH EVERYDAY AT BEDTIME   torsemide 20 MG tablet Commonly known as: Demadex Take 4 tablets (80 mg total) by mouth every morning AND 3 tablets (60 mg total) every evening.   traZODone 100 MG tablet Commonly known as: DESYREL Take 2 tablets (200 mg total) by mouth at bedtime. Take 200 mg by mouth at bedtime.   Trelegy Ellipta 200-62.5-25 MCG/ACT Aepb Generic drug: Fluticasone-Umeclidin-Vilant Inhale 1 puff into the lungs daily.   Tyler Aas FlexTouch 100 UNIT/ML FlexTouch Pen Generic drug: insulin degludec Max daily dose 30 Units Started by: Elayne Snare, MD   Trulicity 4.5 KU/5.7DY Sopn Generic drug: Dulaglutide INJECT 4.38m into THE SKIN ONCE A WEEK   venlafaxine XR 75 MG 24 hr capsule Commonly known as: EFFEXOR-XR Take 3 capsules (225 mg total) by mouth daily with breakfast.   VITAMIN D3 PO Take 2,000 Units by mouth daily.        Allergies:  Allergies  Allergen Reactions   Vancomycin Other (See Comments)    "Red Man Syndrome" 02/02/17: possible cause for rash under both arms   Niacin Other (See Comments)   Niacin And Related Other (See Comments)    Red man syndrome   Tubersol [Tuberculin, Ppd] Other (See Comments)    Reaction unknown   Doxycycline Rash and Other (See Comments)    Past Medical History:  Diagnosis Date   Acquired dilation of ascending aorta and aortic root (HCC)    471mby echo 01/2021   Adenomatous colon polyp 2007   Anemia    Anxiety    Aortic stenosis    moderate AS by echo 01/2021   Asthma    BPH without obstruction/lower urinary tract symptoms 02/22/2017   Chronic diastolic (congestive) heart  failure (HCC)    Chronic venous stasis 03/07/2019   COPD (chronic obstructive pulmonary disease) (HCC)    Coronary artery calcification seen on CAT scan    Depression    Diabetic neuropathy ([facility]/10/2019   History of colon polyps 08/24/2018   Hypertension    Morbid obesity (HCC)    OSA (obstructive sleep apnea)    Pain due to onychomycosis of toenails of both feet 09/11/2019   Peripheral neuropathy 02/22/2017   Primary osteoarthritis, left shoulder 03/05/2017   PTSD (post-traumatic stress disorder)    Pure hypercholesterolemia    QT prolongation 03/07/2019   Seizures (HCC)    Shortness of breath    Sinus tachycardia 03/07/2019   Sleep apnea    CPAP   Type 2 diabetes mellitus with vascular disease ([facility]/10/2019    Past Surgical History:  Procedure Laterality Date   ENDOVENOUS ABLATION SAPHENOUS VEIN W/ LASER Right 08/20/2020   endovenous laser ablation right greater saphenous vein by ChGae GallopD    JOINT REPLACEMENT     left knee replacement x 2   KNEE ARTHROSCOPY Bilateral    LEFT HEART CATH AND CORONARY ANGIOGRAPHY N/A 01/17/2018   Procedure: LEFT HEART CATH AND CORONARY ANGIOGRAPHY;  Surgeon: HaLeonie ManMD;  [facility];  Service: Cardiovascular;  Laterality: N/A;   PRESSURE SENSOR/CARDIOMEMS N/A 08/26/2021   Procedure: PRESSURE SENSOR/CARDIOMEMS;  Surgeon: McLarey DresserMD;  [facility];  Service: Cardiovascular;  Laterality: N/A;   RIGHT HEART CATH N/A 08/26/2021   Procedure: RIGHT HEART CATH;  Surgeon: McLarey DresserMD;  [facility];  Service: Cardiovascular;  Laterality: N/A;   TEE WITHOUT CARDIOVERSION N/A 08/26/2021  Procedure: TRANSESOPHAGEAL ECHOCARDIOGRAM (TEE);  Surgeon: Larey Dresser, MD;  Location: [facility] ENDOSCOPY;  Service: Cardiovascular;  Laterality: N/A;   UMBILICAL HERNIA REPAIR      Family History  Problem Relation Age of Onset   CAD Maternal Grandfather    Diabetes Other     Diabetes Mellitus II Neg Hx    Colon cancer Neg Hx    Esophageal cancer Neg Hx    Inflammatory bowel disease Neg Hx    Liver disease Neg Hx    Pancreatic cancer Neg Hx    Rectal cancer Neg Hx    Stomach cancer Neg Hx    Sleep apnea Neg Hx     Social History:  reports that he quit smoking about 5 years ago. His smoking use included cigarettes. He has a 44.00 pack-year smoking history. He has been exposed to tobacco smoke. He has never used smokeless tobacco. He reports current alcohol use. He reports that he does not use drugs.  Review of Systems:  Hypertension: Currently on metoprolol, diltiazem, spironolactone and prazosin  BP Readings from Last 3 Encounters:  12/24/21 106/72  12/23/21 104/70  11/25/21 116/69    Lipids: Managed by cardiology, recent LDL excellent on atorvastatin 40 mg    Lab Results  Component Value Date   CHOL 163 12/23/2021   CHOL 148 04/28/2021   CHOL 129 01/26/2021   Lab Results  Component Value Date   HDL 35 (L) 12/23/2021   HDL 30 (L) 04/28/2021   HDL 30 (L) 01/26/2021   Lab Results  Component Value Date   LDLCALC 62 12/23/2021   LDLCALC 69 04/28/2021   LDLCALC 50 01/26/2021   Lab Results  Component Value Date   TRIG 330 (H) 12/23/2021   TRIG 246 (H) 04/28/2021   TRIG 245 (H) 01/26/2021   Lab Results  Component Value Date   CHOLHDL 4.7 12/23/2021   CHOLHDL 4.9 04/28/2021   CHOLHDL 4.3 01/26/2021   No results found for: LDLDIRECT  Eye exams: Reportedly no retinopathy and up-to-date  He has numbness in his feet which is variable from neuropathy  He is being treated with CPAP for his OSA   Examination:   BP 106/72   Pulse 88   Ht _0  (1.88 m)   Wt (!) 375 lb (170.1 kg)   SpO2 100%   BMI 48.15 kg/m   Body mass index is 48.15 kg/m.    ASSESSMENT/ PLAN:    Diabetes type 2 with severe obesity:   He is on a 4 drug regimen including Trulicity and Farxiga, currently not on insulin  Blood glucose control is inadequate  with last A1c 8% Recent fasting blood sugars averaging about 160 and no postprandial readings available  LIPIDS managed by cardiology and adequately controlled No evidence of renal dysfunction  Recommendations:  Start monitoring couple of hours after meals Discussed blood sugar targets both fasting and after meals He will look into the possibility of freestyle libre sensor but he thinks he needs clarification from his cardiologist before using it Discussed benefits of using CGM and how to use it, patient information given Restart BASAL insulin with Tyler Aas using titration schedule given in detail Needs to target fasting at 130 Make sure he is reviewing what kind of foods make his blood sugar go up and try to modify diet Consider consultation with dietitian  Recheck A1c at next visit Needs urine microalbumin assessed  Patient Instructions    Tresibainsulin: This insulin provides blood sugar control for up  to 24 hours.   Start with 12 units at bedtime daily and increase by 2 units every 3 days until the waking up sugars are under 130. Then continue the same dose.  If blood sugar is under 90 for 2 days in a row, reduce the dose by 2 units.  Note that this insulin does not control the rise of blood sugar with meals    Check blood sugars on waking up    Also check blood sugars about 2 hours after meals and do this after different meals by rotation  Recommended blood sugar levels on waking up are 90-130 and about 2 hours after meal is 130-160  Please bring your blood sugar monitor to each visit, thank you    Elayne Snare 12/24/2021, 2:31 PM

## 2021-12-23 NOTE — Progress Notes (Signed)
PCP: [name], PA-C Cardiology: Dr. Radford Pax HF Cardiology: Dr. Aundra Dubin  66 y.o. with history of paroxysmal atrial fibrillation, OSA/?OHS, morbid obesity, and chronic diastolic CHF was referred by Dr. Radford Pax for evaluation of CHF.  Patient has been admitted multiple times in the last year for CHF exacerbations.  He uses a walker around the house and a motorized scooter outside the house.   He had a negative cath in 2019 and a negative Cardiolite in 4/22.  Most recent echo in 11/22 was a difficult study but showed EF 60-65%, mild LVH, mildly dilated LV, moderate aortic stenosis (trileaflet aortic valve) with mean gradient 22 mmHg, DI 0.27.   He was admitted in 12/22 with fever/PNA as well as chest pain (ruled out for MI).  He had atrial fibrillation/RVR this admission but converted back to NSR.  He was diuresed. Additionally, he was thought to have a partial seizure and Keppra was restarted.   Cardiomems was placed.   Patient returns today for followup of CHF.  Main complaint is back pain though this is improved with his spinal stimulator.  He is doing more walking.  He has been walking around the house with walker without dyspnea.  No lightheadedness.  Uses CPAP at night.  Rare atypical chest pain.   Cardiomems: PADP 15, goal <19  ECG (personally reviewed): NSR, 1st degree AVB, poor RWP.   Labs (11/22): K 3.7, creatinine 0.89 Labs (1/23): K 3.7, creatinine 0.93 => 0.84, BNP 18 => 37  PMH: 1. Type 2 diabetes 2. Atrial fibrillation: Paroxysmal.  3. HTN 4. Hyperlipidemia 5. OSA, ?OHS: Uses Bipap.  6. Obesity 7. SVT 8. Chest pain: LHC in 6/19 with minimal CAD.  - Cardiolite (4/22): No ischemia.  9. Depression/anxiety.  10. Chronic diastolic CHF: Echo in 49/44 with EF 60-65%, mild LVH, mildly dilated LV, moderate aortic stenosis (trileaflet aortic valve) with mean gradient 22 mmHg, DI 0.27.  - RHC (1/23): RA mean 16, PA 49/24, mean PCWP 16, CI 3.13, PVR 2 WU.  - Cardomems in place.   11. Partial seizures 12. Aortic stenosis: TEE (1/23) with EF 60-65%, mild LVH, mild RVE with normal RV systolic function, mid AI, moderate AS with mean gradient 1.35 cm^2 and mean gradient 28 mmHg.   Social History   Socioeconomic History   Marital status: Significant Other    Spouse name: [name]   Number of children: 1   Years of education: Not on file   Highest education level: Associate degree: academic program  Occupational History   Occupation: retired    Comment: Artist  Tobacco Use   Smoking status: Former    Packs/day: 1.00    Years: 44.00    Pack years: 44.00    Types: Cigarettes    Quit date: 03/07/2016    Years since quitting: 5.8    Passive exposure: Past   Smokeless tobacco: Never  Vaping Use   Vaping Use: Never used  Substance and Sexual Activity   Alcohol use: Yes    Comment: rare   Drug use: No   Sexual activity: Not on file  Other Topics Concern   Not on file  Social History Narrative   Lives with wife [name]   Right Handed   Drinks 1-2 cups caffeine   Social Determinants of Health   Financial Resource Strain: Medium Risk   Difficulty of Paying Living Expenses: Somewhat hard  Food Insecurity: Food Insecurity Present   Worried About [place] in the Last Year:  Sometimes true   Ran Out of Food in the Last Year: Never true  Transportation Needs: Unmet Transportation Needs   Lack of Transportation (Medical): Yes   Lack of Transportation (Non-Medical): No  Physical Activity: Not on file  Stress: Not on file  Social Connections: Not on file  Intimate Partner Violence: Not on file   Family History  Problem Relation Age of Onset   CAD Maternal Grandfather    Diabetes Other    Diabetes Mellitus II Neg Hx    Colon cancer Neg Hx    Esophageal cancer Neg Hx    Inflammatory bowel disease Neg Hx    Liver disease Neg Hx    Pancreatic cancer Neg Hx    Rectal cancer Neg Hx    Stomach cancer Neg Hx    Sleep apnea Neg Hx     ROS: All systems reviewed and negative except as per HPI.   Current Outpatient Medications  Medication Sig Dispense Refill   Accu-Chek FastClix Lancets MISC 1 each by Other route in the morning and at bedtime.     acetaminophen (TYLENOL) 325 MG tablet Take 2 tablets (650 mg total) by mouth every 4 (four) hours as needed for mild pain (or temp > 37.5 C (99.5 F)).     albuterol (VENTOLIN HFA) 108 (90 Base) MCG/ACT inhaler Inhale 2 puffs into the lungs every 6 (six) hours as needed for wheezing or shortness of breath.     Alcohol Swabs (ALCOHOL PREP) 70 % PADS 1 each by Other route in the morning, at noon, and at bedtime.     allopurinol (ZYLOPRIM) 100 MG tablet Take 200 mg by mouth daily.     apixaban (ELIQUIS) 5 MG TABS tablet Take 1 tablet (5 mg total) by mouth 2 (two) times daily. 180 tablet 2   ARIPiprazole (ABILIFY) 5 MG tablet TAKE ONE TABLET BY MOUTH EVERYDAY AT BEDTIME 90 tablet 3   atorvastatin (LIPITOR) 40 MG tablet Take 1 tablet (40 mg total) by mouth every evening. TAKE ONE TABLET BY MOUTH DAILY AT 6PM 90 tablet 3   B-D ULTRAFINE III SHORT PEN 31G X 8 MM MISC 1 each by Other route See admin instructions. Use to inject insulin subcutaneously at bedtime as directed.     blood glucose meter kit and supplies Dispense based on patient and insurance preference. Use up to four times daily as directed. (FOR ICD-10 E10.9, E11.9). 1 each 0   bromocriptine (PARLODEL) 2.5 MG tablet Take 1 tablet (2.5 mg total) by mouth at bedtime. 90 tablet 3   busPIRone (BUSPAR) 5 MG tablet Take 1 tablet (5 mg total) by mouth 3 (three) times daily. 270 tablet 3   cetirizine (ZYRTEC) 10 MG tablet Take 10 mg by mouth at bedtime.      Cholecalciferol (VITAMIN D3 PO) Take 2,000 Units by mouth daily.     diltiazem (CARDIZEM CD) 180 MG 24 hr capsule Take 1 capsule (180 mg total) by mouth daily. 90 capsule 3   FARXIGA 10 MG TABS tablet TAKE ONE TABLET BY MOUTH BEFORE BREAKFAST 90 tablet 3   ferrous sulfate 324 (65  Fe) MG TBEC Take 324 mg by mouth daily with breakfast.      fluticasone (FLONASE) 50 MCG/ACT nasal spray Place 2 sprays into both nostrils daily.      Fluticasone-Umeclidin-Vilant (TRELEGY ELLIPTA) 200-62.5-25 MCG/ACT AEPB Inhale 1 puff into the lungs daily. 60 each 11   isosorbide mononitrate (IMDUR) 30 MG 24 hr tablet TAKE ONE TABLET  BY MOUTH EVERY EVENING 90 tablet 3   levETIRAcetam (KEPPRA) 500 MG tablet Take 1 tablet (500 mg total) by mouth 2 (two) times daily. 120 tablet 11   metFORMIN (GLUCOPHAGE-XR) 500 MG 24 hr tablet Take 4 tablets (2,000 mg total) by mouth daily. 360 tablet 3   metoprolol succinate (TOPROL-XL) 50 MG 24 hr tablet Take 1 tablet (50 mg total) by mouth daily. Take with or immediately following a meal. 90 tablet 2   omeprazole (PRILOSEC) 20 MG capsule Take 20 mg by mouth every morning.     Oxycodone HCl 10 MG TABS Take 10 mg by mouth as needed.     potassium chloride SA (KLOR-CON M) 20 MEQ tablet Take 2 tablets (40 mEq total) by mouth 2 (two) times daily. 270 tablet 3   prazosin (MINIPRESS) 2 MG capsule TAKE TWO CAPSULES BY MOUTH EVERYDAY AT BEDTIME 180 capsule 3   pregabalin (LYRICA) 150 MG capsule Take 1 capsule (150 mg total) by mouth 3 (three) times daily. Please discuss future refills with your PCP. 270 capsule 1   repaglinide (PRANDIN) 2 MG tablet Take 2 tablets (4 mg total) by mouth 2 (two) times daily before a meal. 360 tablet 3   spironolactone (ALDACTONE) 25 MG tablet Take 1 tablet (25 mg total) by mouth daily. 90 tablet 2   temazepam (RESTORIL) 15 MG capsule TAKE ONE CAPSULE BY MOUTH EVERYDAY AT BEDTIME 30 capsule 2   torsemide (DEMADEX) 20 MG tablet Take 4 tablets (80 mg total) by mouth every morning AND 3 tablets (60 mg total) every evening. 450 tablet 3   traZODone (DESYREL) 100 MG tablet Take 2 tablets (200 mg total) by mouth at bedtime. Take 200 mg by mouth at bedtime. 102 tablet 3   TRULICITY 4.5 VO/5.3GU SOPN INJECT 4.$RemoveBefor'5mg'IureXWOCRFIr$  into THE SKIN ONCE A WEEK 6 mL 3    venlafaxine XR (EFFEXOR-XR) 75 MG 24 hr capsule Take 3 capsules (225 mg total) by mouth daily with breakfast. 270 capsule 3   No current facility-administered medications for this encounter.   BP 104/70   Pulse 96   Wt (!) 168.7 kg (372 lb)   SpO2 91%   BMI 47.76 kg/m  General: NAD Neck: Thick, JVP difficult, no thyromegaly or thyroid nodule.  Lungs: Clear to auscultation bilaterally with normal respiratory effort. CV: Nondisplaced PMI.  Heart regular S1/S2, no S3/S4, no murmur.  1+ chronic edema ankles.  No carotid bruit.  Unable to palpate pedal pulses.  Abdomen: Soft, nontender, no hepatosplenomegaly, no distention.  Skin: Intact without lesions or rashes.  Neurologic: Alert and oriented x 3.  Psych: Normal affect. Extremities: No clubbing or cyanosis.  HEENT: Normal.   Assessment/Plan: 1. Chronic diastolic CHF: Echo (44/03) with EF 60-65%, mild LVH, mildly dilated LV, moderate aortic stenosis (trileaflet aortic valve) with mean gradient 22 mmHg, DI 0.27.  Symptoms are confounded by morbid obesity.  Exam remains quite difficult for volume though weight is down.  He is breathing better.  Chronic NYHA class III.  Cardiomems below goal at 15 mmHg.  - Continue torsemide 80 qam/60 qpm and KCl 40 bid.  BMET today.   - Continue dapagliflozin 10 daily.  - Continue spironolactone 25 daily. 2. Aortic stenosis: Moderate AS on TEE in 1/23.  3. Atrial fibrillation: Paroxysmal. He is in NSR today.  - Continue apixaban.  - Continue Toprol XL and diltiazem CD.  4. OSA/?OHS: Continue Bipap.  5. Obesity: Refer to [place] [facility] for semaglutide.  6. Hyperlipidemia: Check  lipids.   Followup 3 months with APP.   Loralie Champagne 12/23/2021

## 2021-12-24 ENCOUNTER — Telehealth (HOSPITAL_COMMUNITY): Payer: Self-pay | Admitting: *Deleted

## 2021-12-24 ENCOUNTER — Ambulatory Visit (INDEPENDENT_AMBULATORY_CARE_PROVIDER_SITE_OTHER): Payer: Medicare HMO | Admitting: Endocrinology

## 2021-12-24 ENCOUNTER — Encounter: Payer: Self-pay | Admitting: Endocrinology

## 2021-12-24 VITALS — BP 106/72 | HR 88 | Ht 74.0 in | Wt 375.0 lb

## 2021-12-24 DIAGNOSIS — E1142 Type 2 diabetes mellitus with diabetic polyneuropathy: Secondary | ICD-10-CM

## 2021-12-24 DIAGNOSIS — E1165 Type 2 diabetes mellitus with hyperglycemia: Secondary | ICD-10-CM | POA: Diagnosis not present

## 2021-12-24 DIAGNOSIS — E1159 Type 2 diabetes mellitus with other circulatory complications: Secondary | ICD-10-CM

## 2021-12-24 MED ORDER — TRESIBA FLEXTOUCH 100 UNIT/ML ~~LOC~~ SOPN: PEN_INJECTOR | SUBCUTANEOUS | 1 refills | Status: DC

## 2021-12-24 NOTE — Telephone Encounter (Signed)
Pts wife left vm stating pts pcp wants him to have an implanted glucometer but she read it can interfere with his cardiomems. Pts wife asked if it was safe to proceed with implanting glucometer.

## 2021-12-24 NOTE — Patient Instructions (Addendum)
Tresibainsulin: This insulin provides blood sugar control for up to 24 hours.   Start with 12 units at bedtime daily and increase by 2 units every 3 days until the waking up sugars are under 130. Then continue the same dose.  If blood sugar is under 90 for 2 days in a row, reduce the dose by 2 units.  Note that this insulin does not control the rise of blood sugar with meals    Check blood sugars on waking up    Also check blood sugars about 2 hours after meals and do this after different meals by rotation  Recommended blood sugar levels on waking up are 90-130 and about 2 hours after meal is 130-160  Please bring your blood sugar monitor to each visit, thank you

## 2021-12-24 NOTE — Telephone Encounter (Signed)
I think this should be ok but would clear with Cardiomems rep ([place]).

## 2021-12-27 ENCOUNTER — Encounter: Payer: Self-pay | Admitting: Endocrinology

## 2021-12-27 ENCOUNTER — Encounter (HOSPITAL_COMMUNITY): Payer: Self-pay | Admitting: Cardiology

## 2021-12-27 ENCOUNTER — Telehealth (HOSPITAL_COMMUNITY): Payer: Self-pay | Admitting: *Deleted

## 2021-12-27 DIAGNOSIS — E782 Mixed hyperlipidemia: Secondary | ICD-10-CM

## 2021-12-27 MED ORDER — ICOSAPENT ETHYL 1 G PO CAPS: 2.0000 g | ORAL_CAPSULE | Freq: Two times a day (BID) | ORAL | 5 refills | Status: DC

## 2021-12-27 NOTE — Addendum Note (Signed)
Addended by: Emerson Monte B on: 12/27/2021 04:19 PM   Modules accepted: Orders

## 2021-12-27 NOTE — Telephone Encounter (Addendum)
Spoke with patient re: lab results, new medication (Vascepa 2 gm BID), and need for repeat lab work per Dr Aundra Dubin. Vascepa prescription sent to pt's pharmacy. Scheduled for repeat lipid panel in 2 months. Order placed. Pt verbalized understanding to all the above.     Emerson Monte RN [place] Coordinator  Office: [phone]  24/7 Pager: [phone]

## 2021-12-27 NOTE — Progress Notes (Signed)
Attempted to call patient re: lab results, new medication, and need for repeat labs. No answer. Left message and requested return call.   Emerson Monte RN [place] Coordinator  Office: [phone]  24/7 Pager: [phone]

## 2021-12-27 NOTE — Telephone Encounter (Signed)
Spoke with pt.he is aware.

## 2021-12-29 ENCOUNTER — Encounter: Payer: Self-pay | Admitting: Endocrinology

## 2021-12-29 ENCOUNTER — Encounter (HOSPITAL_COMMUNITY): Payer: Self-pay | Admitting: Cardiology

## 2021-12-30 ENCOUNTER — Other Ambulatory Visit: Payer: Self-pay | Admitting: Neurology

## 2021-12-31 ENCOUNTER — Other Ambulatory Visit: Payer: Self-pay | Admitting: Neurology

## 2021-12-31 DIAGNOSIS — I10 Essential (primary) hypertension: Secondary | ICD-10-CM | POA: Diagnosis not present

## 2021-12-31 DIAGNOSIS — E1165 Type 2 diabetes mellitus with hyperglycemia: Secondary | ICD-10-CM | POA: Diagnosis not present

## 2021-12-31 DIAGNOSIS — I5032 Chronic diastolic (congestive) heart failure: Secondary | ICD-10-CM | POA: Diagnosis not present

## 2021-12-31 DIAGNOSIS — J449 Chronic obstructive pulmonary disease, unspecified: Secondary | ICD-10-CM | POA: Diagnosis not present

## 2021-12-31 DIAGNOSIS — E785 Hyperlipidemia, unspecified: Secondary | ICD-10-CM | POA: Diagnosis not present

## 2022-01-03 ENCOUNTER — Other Ambulatory Visit: Payer: Self-pay | Admitting: Cardiology

## 2022-01-03 ENCOUNTER — Other Ambulatory Visit: Payer: Self-pay | Admitting: Adult Health

## 2022-01-03 DIAGNOSIS — I480 Paroxysmal atrial fibrillation: Secondary | ICD-10-CM

## 2022-01-03 DIAGNOSIS — G4700 Insomnia, unspecified: Secondary | ICD-10-CM

## 2022-01-04 ENCOUNTER — Other Ambulatory Visit: Payer: Self-pay | Admitting: Adult Health

## 2022-01-04 DIAGNOSIS — G4700 Insomnia, unspecified: Secondary | ICD-10-CM

## 2022-01-06 NOTE — Telephone Encounter (Signed)
Prescription refill request for Eliquis received. Indication: Afib  Last office visit: 12/23/21 Aundra Dubin)  Scr: 1.02 (12/23/21)  Age: 66 Weight: 170.1kg  Appropriate dose and refill sent to requested pharmacy.

## 2022-01-07 ENCOUNTER — Ambulatory Visit: Payer: Medicare HMO | Admitting: Podiatry

## 2022-01-07 ENCOUNTER — Telehealth: Payer: Self-pay | Admitting: Neurology

## 2022-01-07 MED ORDER — PREGABALIN 150 MG PO CAPS: 150.0000 mg | ORAL_CAPSULE | Freq: Three times a day (TID) | ORAL | 0 refills | Status: DC

## 2022-01-07 NOTE — Telephone Encounter (Signed)
There was a message from the answering service from [facility] that "patient needs a prior authorization for his pain meds.  Patient not getting enough sleep due to pain ".  Since the office is closed, there is nothing I can do about a prior authorization.  The number provided by the answering service was actually the patient's phone number and I got voicemail when I called.  I left a voicemail letting them know that I would forward this message to you and it could be handled on Monday.  The mass is also stated that he was a patient of Dr.Yan.  It looks like he has seen Dr. April Manson and Dr. Rexene Alberts once each but not Dr. Krista Blue  It looks like our office has written for levetiracetam and pregabalin in the past.

## 2022-01-07 NOTE — Addendum Note (Signed)
Addended by: Britt Bottom on: 01/07/2022 06:10 PM   Modules accepted: Orders

## 2022-01-07 NOTE — Telephone Encounter (Signed)
He called back.  He insists he has been trying for the last week to have our office preauthorize and refill his pregabalin but there is no documentation that he has done that.  I let him know that but that I would send a refill in.[name]  However, a preauthorization if needed would have to wait till next week.

## 2022-01-10 NOTE — Telephone Encounter (Signed)
Patient sent note about Lawrence [name] 2 states that [facility] was sending [place] paperwork to complete. Never got paperwork. So I did through [employer] and [facility] doesn't accept patient insurance. So I sent to [employer] and notified patient via mychart.

## 2022-01-10 NOTE — Telephone Encounter (Signed)
Thank you :)

## 2022-01-10 NOTE — Telephone Encounter (Signed)
Noted. If PA is required, then we will complete it. Otherwise, the refills have been sent to the pharmacy.

## 2022-01-13 DIAGNOSIS — R6889 Other general symptoms and signs: Secondary | ICD-10-CM | POA: Diagnosis not present

## 2022-01-17 ENCOUNTER — Encounter: Payer: Self-pay | Admitting: Endocrinology

## 2022-01-17 DIAGNOSIS — E1159 Type 2 diabetes mellitus with other circulatory complications: Secondary | ICD-10-CM

## 2022-01-20 ENCOUNTER — Ambulatory Visit: Payer: Medicare HMO | Admitting: Pulmonary Disease

## 2022-01-20 MED ORDER — FREESTYLE LIBRE 2 SENSOR MISC: 2 refills | Status: DC

## 2022-01-20 MED ORDER — FREESTYLE LIBRE 2 READER DEVI: 0 refills | Status: DC

## 2022-01-21 DIAGNOSIS — I4891 Unspecified atrial fibrillation: Secondary | ICD-10-CM | POA: Diagnosis not present

## 2022-01-21 DIAGNOSIS — R6889 Other general symptoms and signs: Secondary | ICD-10-CM | POA: Diagnosis not present

## 2022-01-21 DIAGNOSIS — E1142 Type 2 diabetes mellitus with diabetic polyneuropathy: Secondary | ICD-10-CM | POA: Diagnosis not present

## 2022-01-21 DIAGNOSIS — I509 Heart failure, unspecified: Secondary | ICD-10-CM | POA: Diagnosis not present

## 2022-01-21 DIAGNOSIS — K219 Gastro-esophageal reflux disease without esophagitis: Secondary | ICD-10-CM | POA: Diagnosis not present

## 2022-01-21 DIAGNOSIS — J449 Chronic obstructive pulmonary disease, unspecified: Secondary | ICD-10-CM | POA: Diagnosis not present

## 2022-01-21 DIAGNOSIS — E1140 Type 2 diabetes mellitus with diabetic neuropathy, unspecified: Secondary | ICD-10-CM | POA: Diagnosis not present

## 2022-01-21 DIAGNOSIS — G4730 Sleep apnea, unspecified: Secondary | ICD-10-CM | POA: Diagnosis not present

## 2022-01-21 DIAGNOSIS — I110 Hypertensive heart disease with heart failure: Secondary | ICD-10-CM | POA: Diagnosis not present

## 2022-01-21 DIAGNOSIS — Z6841 Body Mass Index (BMI) 40.0 and over, adult: Secondary | ICD-10-CM | POA: Diagnosis not present

## 2022-01-21 DIAGNOSIS — M4726 Other spondylosis with radiculopathy, lumbar region: Secondary | ICD-10-CM | POA: Diagnosis not present

## 2022-01-21 DIAGNOSIS — Z8673 Personal history of transient ischemic attack (TIA), and cerebral infarction without residual deficits: Secondary | ICD-10-CM | POA: Diagnosis not present

## 2022-01-21 DIAGNOSIS — I10 Essential (primary) hypertension: Secondary | ICD-10-CM | POA: Diagnosis not present

## 2022-01-24 ENCOUNTER — Ambulatory Visit: Payer: Medicare HMO | Admitting: Pulmonary Disease

## 2022-01-26 ENCOUNTER — Encounter: Payer: Self-pay | Admitting: Podiatry

## 2022-01-26 ENCOUNTER — Ambulatory Visit (INDEPENDENT_AMBULATORY_CARE_PROVIDER_SITE_OTHER): Payer: Medicare HMO | Admitting: Podiatry

## 2022-01-26 DIAGNOSIS — M79674 Pain in right toe(s): Secondary | ICD-10-CM | POA: Diagnosis not present

## 2022-01-26 DIAGNOSIS — E1142 Type 2 diabetes mellitus with diabetic polyneuropathy: Secondary | ICD-10-CM | POA: Diagnosis not present

## 2022-01-26 DIAGNOSIS — I878 Other specified disorders of veins: Secondary | ICD-10-CM

## 2022-01-26 DIAGNOSIS — M79675 Pain in left toe(s): Secondary | ICD-10-CM

## 2022-01-26 DIAGNOSIS — B351 Tinea unguium: Secondary | ICD-10-CM

## 2022-01-26 DIAGNOSIS — R6889 Other general symptoms and signs: Secondary | ICD-10-CM | POA: Diagnosis not present

## 2022-01-26 NOTE — Progress Notes (Signed)
This patient returns to my office for at risk foot care.  This patient requires this care by a professional since this patient will be at risk due to having diabetic neuropathy and angiopathy and venous stasis.  . This patient presents to the office with his wife.This patient is unable to cut nails himself since the patient cannot reach his nails.These nails are painful walking and wearing shoes. This patient presents for at risk foot care today.  General Appearance  Alert, conversant and in no acute stress.  Vascular  Dorsalis pedis and posterior tibial  pulses are not  palpable  billaterally due to severe swelling feet.  Capillary return is within normal limits  bilaterally. Temperature is within normal limits  Bilaterally. Venous stasis right leg.  Neurologic  Senn-Weinstein monofilament wire test absent right foot.  Diminished left foot.. Muscle power within normal limits bilaterally.  Nails Thick disfigured discolored nails with subungual debris  from hallux to fifth toes bilaterally.  Pincer hallux nails   B/L.   Orthopedic  No limitations of motion  feet .  No crepitus or effusions noted.  No bony pathology or digital deformities noted.  Severe HAV  B/L.  Skin  normotropic skin with no porokeratosis noted bilaterally.  No signs of infections or ulcers noted.     Onychomycosis  Pain in right toes  Pain in left toes  Paronychia left hallux.  Consent was obtained for treatment procedures.   Mechanical debridement of nails 1-5  bilaterally performed with a nail nipper.  Filed with dremel without incident.  Left ankle healing since bleeding  incident.   Return office visit   3 months                   Told patient to return for periodic foot care and evaluation due to potential at risk complications.   Gardiner Barefoot DPM

## 2022-01-30 ENCOUNTER — Other Ambulatory Visit: Payer: Self-pay | Admitting: Cardiology

## 2022-01-31 ENCOUNTER — Telehealth: Payer: Self-pay | Admitting: Adult Health

## 2022-01-31 NOTE — Telephone Encounter (Signed)
 Pt LVM at 4:13p.  He has a 4# dog that he wants to know how to certified as a "therapy dog".   Next appt 8/2

## 2022-02-01 DIAGNOSIS — R6889 Other general symptoms and signs: Secondary | ICD-10-CM | POA: Diagnosis not present

## 2022-02-02 ENCOUNTER — Ambulatory Visit (INDEPENDENT_AMBULATORY_CARE_PROVIDER_SITE_OTHER): Payer: Medicare HMO | Admitting: Pulmonary Disease

## 2022-02-02 ENCOUNTER — Encounter: Payer: Self-pay | Admitting: Pulmonary Disease

## 2022-02-02 VITALS — BP 108/62 | HR 86 | Temp 98.0°F | Ht 74.0 in | Wt 369.0 lb

## 2022-02-02 DIAGNOSIS — J9601 Acute respiratory failure with hypoxia: Secondary | ICD-10-CM | POA: Diagnosis not present

## 2022-02-02 DIAGNOSIS — R0602 Shortness of breath: Secondary | ICD-10-CM

## 2022-02-02 MED ORDER — ALBUTEROL SULFATE HFA 108 (90 BASE) MCG/ACT IN AERS: 2.0000 | INHALATION_SPRAY | Freq: Four times a day (QID) | RESPIRATORY_TRACT | 6 refills | Status: DC | PRN

## 2022-02-02 NOTE — Patient Instructions (Addendum)
Glad you are doing well  Continue Trelegy  I refilled the albuterol rescue inhaler  Return to clinic in 6 months or sooner as needed

## 2022-02-02 NOTE — Progress Notes (Signed)
_0  ID: Lawrence [name], male    DOB: 09-Sep-1955, 66 y.o.   MRN: [id number]  Chief Complaint  Patient presents with   Follow-up    Pt states his breathing is good. Pt had back surgery on 6/13. Doing well    Referring provider: [name], PA-C  HPI:   66 y.o. man whom we are seeing in follow up for evaluation of dyspnea on exertion.  Most recent cardiology note reviewed.    Doing well. Started trelegy at last visit increased from stiolto. Reports rare if any albuterol use in interim. States cardiomems is doing well. Stable diuretic dosing.  Recent successful back/spine stimulator surgery.  Finds is helpful in terms of pain  HPI at initial visit: Patient reports several months more likely over a year history of dyspnea on exertion.  Suspect longer than this.  Worse on inclines or stairs.  Present on flat surfaces as well.  No time of day when things are better or worse.  No position that makes things better or worse.  No seasonal environmental factors he can identify to make things better or worse.  No alleviating or exacerbating factors other than rest.  Patient feels more short of breath when in atrial fibrillation.  Endorses worsening lower extremity swelling when he feels more short of breath.  Reviewed most recent chest x-ray 05/28/2021 demonstrates interstitial edema on my interpretation.  Reviewed most recent echocardiogram fall 2022 demonstrates diastolic dysfunction.  PMH: Diabetes, hypertension, atrial fibrillation, diastolic dysfunction Surgical history: Knee replacement, hernia repair Family history: Denies significant respiratory illness in first-degree relative Social history: Former smoker, 40+ pack year, quit 2017, lives in [place] / Pulmonary Flowsheets:   ACT:      No data to display          MMRC:     No data to display          Epworth:      No data to display          Tests:   FENO:  No results found for:  "NITRICOXIDE"  PFT:     No data to display          WALK:      No data to display          Imaging: Personally reviewed and as per EMR and discussion this note was  Lab Results: Personally reviewed CBC    Component Value Date/Time   WBC 13.3 (H) 09/26/2021 1313   RBC 4.91 09/26/2021 1313   HGB 14.2 09/26/2021 1313   HCT 44.5 09/26/2021 1313   PLT 282 09/26/2021 1313   MCV 90.6 09/26/2021 1313   MCH 28.9 09/26/2021 1313   MCHC 31.9 09/26/2021 1313   RDW 14.9 09/26/2021 1313   LYMPHSABS 1.6 09/26/2021 1313   MONOABS 0.7 09/26/2021 1313   EOSABS 0.5 09/26/2021 1313   BASOSABS 0.1 09/26/2021 1313    BMET    Component Value Date/Time   NA 137 12/23/2021 0934   NA 136 01/07/2021 0000   K 4.1 12/23/2021 0934   CL 101 12/23/2021 0934   CO2 28 12/23/2021 0934   GLUCOSE 247 (H) 12/23/2021 0934   BUN 19 12/23/2021 0934   BUN 22 01/07/2021 0000   CREATININE 1.02 12/23/2021 0934   CALCIUM 9.0 12/23/2021 0934   GFRNONAA >60 12/23/2021 0934   GFRAA 77 05/20/2020 1038    BNP    Component Value Date/Time   BNP 37.0 08/19/2021 1300  ProBNP No results found for: "PROBNP"  Specialty Problems       Pulmonary Problems   OSA (obstructive sleep apnea)   Acute respiratory failure with hypoxia (HCC)   Shortness of breath   COPD (chronic obstructive pulmonary disease) (HCC)   Pneumonia    Allergies  Allergen Reactions   Vancomycin Other (See Comments)    "Red Man Syndrome" 02/02/17: possible cause for rash under both arms   Niacin Other (See Comments)   Niacin And Related Other (See Comments)    Red man syndrome   Tubersol [Tuberculin, Ppd] Other (See Comments)    Reaction unknown   Doxycycline Rash and Other (See Comments)    Immunization History  Administered Date(s) Administered   Influenza Split 04/13/2015, 05/23/2016   Influenza,inj,Quad PF,6+ Mos 08/16/2017, 06/22/2018, 04/04/2019   Influenza-Unspecified 06/28/2007, 06/22/2018, 05/09/2019    Moderna Sars-Covid-2 Vaccination 10/16/2018, 10/16/2019, 11/13/2019, 06/12/2020, 12/24/2020   Pfizer Covid-19 Vaccine Bivalent Booster 36yr & up 04/25/2021   Pneumococcal Polysaccharide-23 09/16/2015, 10/18/2020   Tdap 09/16/2015    Past Medical History:  Diagnosis Date   Acquired dilation of ascending aorta and aortic root ([facility]    458mby echo 01/2021   Adenomatous colon polyp 2007   Anemia    Anxiety    Aortic stenosis    moderate AS by echo 01/2021   Asthma    BPH without obstruction/lower urinary tract symptoms 02/22/2017   Chronic diastolic (congestive) heart failure (HCC)    Chronic venous stasis 03/07/2019   COPD (chronic obstructive pulmonary disease) (HCC)    Coronary artery calcification seen on CAT scan    Depression    Diabetic neuropathy ([facility]10[facility]   History of colon polyps 08/24/2018   Hypertension    Morbid obesity (HCC)    OSA (obstructive sleep apnea)    Pain due to onychomycosis of toenails of both feet 09/11/2019   Peripheral neuropathy 02/22/2017   Primary osteoarthritis, left shoulder 03/05/2017   PTSD (post-traumatic stress disorder)    Pure hypercholesterolemia    QT prolongation 03/07/2019   Seizures (HCC)    Shortness of breath    Sinus tachycardia 03/07/2019   Sleep apnea    CPAP   Type 2 diabetes mellitus with vascular disease ([facility]/10/2019    Tobacco History: Social History   Tobacco Use  Smoking Status Former   Packs/day: 1.00   Years: 44.00   Total pack years: 44.00   Types: Cigarettes   Quit date: 03/07/2016   Years since quitting: 5.9   Passive exposure: Past  Smokeless Tobacco Never   Counseling given: Not Answered   Continue to not smoke  Outpatient Encounter Medications as of 02/02/2022  Medication Sig   Accu-Chek FastClix Lancets MISC 1 each by Other route in the morning and at bedtime.   acetaminophen (TYLENOL) 325 MG tablet Take 2 tablets (650 mg total) by mouth every 4 (four) hours as needed for mild pain (or  temp > 37.5 C (99.5 F)).   albuterol (VENTOLIN HFA) 108 (90 Base) MCG/ACT inhaler Inhale 2 puffs into the lungs every 6 (six) hours as needed for wheezing or shortness of breath.   Alcohol Swabs (ALCOHOL PREP) 70 % PADS 1 each by Other route in the morning, at noon, and at bedtime.   allopurinol (ZYLOPRIM) 100 MG tablet Take 200 mg by mouth daily.   ARIPiprazole (ABILIFY) 5 MG tablet TAKE ONE TABLET BY MOUTH EVERYDAY AT BEDTIME   atorvastatin (LIPITOR) 40 MG tablet Take 1 tablet (40 mg  total) by mouth every evening. TAKE ONE TABLET BY MOUTH DAILY AT 6PM   B-D ULTRAFINE III SHORT PEN 31G X 8 MM MISC 1 each by Other route See admin instructions. Use to inject insulin subcutaneously at bedtime as directed.   blood glucose meter kit and supplies Dispense based on patient and insurance preference. Use up to four times daily as directed. (FOR ICD-10 E10.9, E11.9).   bromocriptine (PARLODEL) 2.5 MG tablet Take 1 tablet (2.5 mg total) by mouth at bedtime.   busPIRone (BUSPAR) 5 MG tablet Take 1 tablet (5 mg total) by mouth 3 (three) times daily.   cetirizine (ZYRTEC) 10 MG tablet Take 10 mg by mouth at bedtime.    Cholecalciferol (VITAMIN D3 PO) Take 2,000 Units by mouth daily.   Continuous Blood Gluc Receiver (FREESTYLE LIBRE 2 READER) DEVI Change blood sugar daily   Continuous Blood Gluc Sensor (FREESTYLE LIBRE 2 SENSOR) MISC Change every 14 days   diltiazem (CARDIZEM CD) 180 MG 24 hr capsule TAKE ONE CAPSULE BY MOUTH ONCE DAILY   ELIQUIS 5 MG TABS tablet TAKE ONE TABLET BY MOUTH TWICE DAILY   FARXIGA 10 MG TABS tablet TAKE ONE TABLET BY MOUTH BEFORE BREAKFAST   ferrous sulfate 324 (65 Fe) MG TBEC Take 324 mg by mouth daily with breakfast.    fluticasone (FLONASE) 50 MCG/ACT nasal spray Place 2 sprays into both nostrils daily.    Fluticasone-Umeclidin-Vilant (TRELEGY ELLIPTA) 200-62.5-25 MCG/ACT AEPB Inhale 1 puff into the lungs daily.   icosapent Ethyl (VASCEPA) 1 g capsule Take 2 capsules (2 g total)  by mouth 2 (two) times daily.   insulin degludec (TRESIBA FLEXTOUCH) 100 UNIT/ML FlexTouch Pen Max daily dose 30 Units   isosorbide mononitrate (IMDUR) 30 MG 24 hr tablet TAKE ONE TABLET BY MOUTH EVERY EVENING   levETIRAcetam (KEPPRA) 500 MG tablet Take 1 tablet (500 mg total) by mouth 2 (two) times daily.   metFORMIN (GLUCOPHAGE-XR) 500 MG 24 hr tablet Take 4 tablets (2,000 mg total) by mouth daily.   metoprolol succinate (TOPROL-XL) 50 MG 24 hr tablet Take 1 tablet by mouth daily. Take with or immediately following a meal.   omeprazole (PRILOSEC) 20 MG capsule Take 20 mg by mouth every morning.   Oxycodone HCl 10 MG TABS Take 10 mg by mouth as needed.   potassium chloride SA (KLOR-CON M) 20 MEQ tablet Take 2 tablets (40 mEq total) by mouth 2 (two) times daily. (Patient not taking: Reported on 12/24/2021)   prazosin (MINIPRESS) 2 MG capsule TAKE TWO CAPSULES BY MOUTH EVERYDAY AT BEDTIME   pregabalin (LYRICA) 150 MG capsule Take 1 capsule (150 mg total) by mouth 3 (three) times daily.   repaglinide (PRANDIN) 2 MG tablet Take 2 tablets (4 mg total) by mouth 2 (two) times daily before a meal.   spironolactone (ALDACTONE) 25 MG tablet Take 1 tablet (25 mg total) by mouth daily.   temazepam (RESTORIL) 15 MG capsule TAKE ONE CAPSULE BY MOUTH EVERYDAY AT BEDTIME   torsemide (DEMADEX) 20 MG tablet Take 4 tablets (80 mg total) by mouth every morning AND 3 tablets (60 mg total) every evening.   traZODone (DESYREL) 100 MG tablet Take 2 tablets (200 mg total) by mouth at bedtime. Take 200 mg by mouth at bedtime.   TRULICITY 4.5 HQ/4.6NG SOPN INJECT 4.73m into THE SKIN ONCE A WEEK   venlafaxine XR (EFFEXOR-XR) 75 MG 24 hr capsule Take 3 capsules (225 mg total) by mouth daily with breakfast.   [DISCONTINUED] albuterol (  VENTOLIN HFA) 108 (90 Base) MCG/ACT inhaler Inhale 2 puffs into the lungs every 6 (six) hours as needed for wheezing or shortness of breath.   No facility-administered encounter medications on  file as of 02/02/2022.     Review of Systems  Review of Systems  N/a Physical Exam  BP 108/62 (BP Location: Left Arm, Patient Position: Sitting, Cuff Size: Large)   Pulse 86   Temp 98 F (36.7 C) (Oral)   Ht 6' 2" (1.88 m)   Wt (!) 369 lb (167.4 kg)   SpO2 90%   BMI 47.38 kg/m   Wt Readings from Last 5 Encounters:  02/02/22 (!) 369 lb (167.4 kg)  12/24/21 (!) 375 lb (170.1 kg)  12/23/21 (!) 372 lb (168.7 kg)  11/25/21 (!) 370 lb (167.8 kg)  10/18/21 (!) 376 lb (170.6 kg)    BMI Readings from Last 5 Encounters:  02/02/22 47.38 kg/m  12/24/21 48.15 kg/m  12/23/21 47.76 kg/m  11/25/21 47.51 kg/m  10/18/21 48.28 kg/m     Physical Exam  General: Obese, sitting in a chair Eyes: EOMI, icterus Neck: Supple, normal range of motion Pulmonary: Clear, normal work of breathing Cardiovascular: Irregular, no murmur Abdomen: Obese, nondistended MSK: No synovitis, no joint effusion Neuro: No weakness, sensation intact Psych: Normal mood, full affect with  Assessment & Plan:   Dyspnea on exertion/shortness of breath:   Likely multifactorial and largely related to fluid overload exacerbated atrial fibrillation given prior chest imaging.  Possible development of COPD given his history of cigarette smoking.  Likely contribution of habitus causing extraparenchymal restriction.  Also likely component of deconditioning.  He does endorse seasonal allergies so asthma is also possibility.   Stiolto prescribed 07/2021 with mild improvement in symptoms, escalated to Trelegy 10/2021 with further improvement in symptoms.  Asthma: Clinical diagnosis based on seasonal allergies, worsening breathing with pollens.  Improved Stiolto.  Escalated to Trelegy 10/2021 given worsening symptoms with pollens.  Further improvement in symptoms.  Albuterol refilled today.  Obstructive sleep apnea: Managed by neurology.  Report of desaturation despite CPAP therapy.  We will message sleep doctor.  May need  to arrange an in lab study to evaluate for hypoxemia despite CPAP.  If present, would encourage oxygen bleed in.  Return in about 6 months (around 08/04/2022).   Lanier Clam, MD 02/02/2022

## 2022-02-04 NOTE — Telephone Encounter (Signed)
Patient said he will get a $20 decrease in rent, but that dog also has a calming effect on him. He tends to isolate and he feels like if he can take his dog with him that it will help him when he has to go out.

## 2022-02-23 DIAGNOSIS — Z6841 Body Mass Index (BMI) 40.0 and over, adult: Secondary | ICD-10-CM | POA: Diagnosis not present

## 2022-02-23 DIAGNOSIS — I10 Essential (primary) hypertension: Secondary | ICD-10-CM | POA: Diagnosis not present

## 2022-02-23 DIAGNOSIS — M1990 Unspecified osteoarthritis, unspecified site: Secondary | ICD-10-CM | POA: Diagnosis not present

## 2022-02-23 DIAGNOSIS — J449 Chronic obstructive pulmonary disease, unspecified: Secondary | ICD-10-CM | POA: Diagnosis not present

## 2022-02-23 DIAGNOSIS — E785 Hyperlipidemia, unspecified: Secondary | ICD-10-CM | POA: Diagnosis not present

## 2022-02-23 DIAGNOSIS — G4700 Insomnia, unspecified: Secondary | ICD-10-CM | POA: Diagnosis not present

## 2022-02-23 DIAGNOSIS — E1165 Type 2 diabetes mellitus with hyperglycemia: Secondary | ICD-10-CM | POA: Diagnosis not present

## 2022-02-23 DIAGNOSIS — I5032 Chronic diastolic (congestive) heart failure: Secondary | ICD-10-CM | POA: Diagnosis not present

## 2022-02-23 DIAGNOSIS — E1169 Type 2 diabetes mellitus with other specified complication: Secondary | ICD-10-CM | POA: Diagnosis not present

## 2022-02-24 ENCOUNTER — Emergency Department (HOSPITAL_COMMUNITY): Payer: No Typology Code available for payment source

## 2022-02-24 ENCOUNTER — Observation Stay (HOSPITAL_COMMUNITY)
Admission: EM | Admit: 2022-02-24 | Discharge: 2022-02-26 | Disposition: A | Discharge status: Home Health | Payer: No Typology Code available for payment source | Attending: Internal Medicine | Admitting: Internal Medicine

## 2022-02-24 DIAGNOSIS — I639 Cerebral infarction, unspecified: Secondary | ICD-10-CM | POA: Diagnosis present

## 2022-02-24 DIAGNOSIS — Z6841 Body Mass Index (BMI) 40.0 and over, adult: Secondary | ICD-10-CM | POA: Diagnosis not present

## 2022-02-24 DIAGNOSIS — Z20822 Contact with and (suspected) exposure to covid-19: Secondary | ICD-10-CM | POA: Insufficient documentation

## 2022-02-24 DIAGNOSIS — I5032 Chronic diastolic (congestive) heart failure: Secondary | ICD-10-CM | POA: Diagnosis present

## 2022-02-24 DIAGNOSIS — IMO0001 Reserved for inherently not codable concepts without codable children: Secondary | ICD-10-CM

## 2022-02-24 DIAGNOSIS — Z789 Other specified health status: Secondary | ICD-10-CM

## 2022-02-24 DIAGNOSIS — J45909 Unspecified asthma, uncomplicated: Secondary | ICD-10-CM | POA: Diagnosis not present

## 2022-02-24 DIAGNOSIS — E7800 Pure hypercholesterolemia, unspecified: Secondary | ICD-10-CM | POA: Diagnosis present

## 2022-02-24 DIAGNOSIS — Z7985 Long-term (current) use of injectable non-insulin antidiabetic drugs: Secondary | ICD-10-CM | POA: Insufficient documentation

## 2022-02-24 DIAGNOSIS — Z7901 Long term (current) use of anticoagulants: Secondary | ICD-10-CM | POA: Diagnosis not present

## 2022-02-24 DIAGNOSIS — R2990 Unspecified symptoms and signs involving the nervous system: Secondary | ICD-10-CM

## 2022-02-24 DIAGNOSIS — Z66 Do not resuscitate: Secondary | ICD-10-CM | POA: Diagnosis not present

## 2022-02-24 DIAGNOSIS — Z96652 Presence of left artificial knee joint: Secondary | ICD-10-CM | POA: Insufficient documentation

## 2022-02-24 DIAGNOSIS — E669 Obesity, unspecified: Secondary | ICD-10-CM | POA: Diagnosis present

## 2022-02-24 DIAGNOSIS — R29818 Other symptoms and signs involving the nervous system: Secondary | ICD-10-CM | POA: Diagnosis present

## 2022-02-24 DIAGNOSIS — Z710 Person encountering health services to consult on behalf of another person: Secondary | ICD-10-CM | POA: Insufficient documentation

## 2022-02-24 DIAGNOSIS — Z87891 Personal history of nicotine dependence: Secondary | ICD-10-CM | POA: Diagnosis not present

## 2022-02-24 DIAGNOSIS — R9431 Abnormal electrocardiogram [ECG] [EKG]: Secondary | ICD-10-CM | POA: Diagnosis not present

## 2022-02-24 DIAGNOSIS — I69354 Hemiplegia and hemiparesis following cerebral infarction affecting left non-dominant side: Secondary | ICD-10-CM

## 2022-02-24 DIAGNOSIS — E1169 Type 2 diabetes mellitus with other specified complication: Secondary | ICD-10-CM | POA: Insufficient documentation

## 2022-02-24 DIAGNOSIS — Z79899 Other long term (current) drug therapy: Secondary | ICD-10-CM | POA: Diagnosis not present

## 2022-02-24 DIAGNOSIS — I6381 Other cerebral infarction due to occlusion or stenosis of small artery: Principal | ICD-10-CM | POA: Insufficient documentation

## 2022-02-24 DIAGNOSIS — G4733 Obstructive sleep apnea (adult) (pediatric): Secondary | ICD-10-CM | POA: Diagnosis present

## 2022-02-24 DIAGNOSIS — J449 Chronic obstructive pulmonary disease, unspecified: Secondary | ICD-10-CM | POA: Diagnosis present

## 2022-02-24 DIAGNOSIS — E1142 Type 2 diabetes mellitus with diabetic polyneuropathy: Secondary | ICD-10-CM

## 2022-02-24 DIAGNOSIS — Z8673 Personal history of transient ischemic attack (TIA), and cerebral infarction without residual deficits: Secondary | ICD-10-CM | POA: Insufficient documentation

## 2022-02-24 DIAGNOSIS — I480 Paroxysmal atrial fibrillation: Secondary | ICD-10-CM | POA: Diagnosis not present

## 2022-02-24 DIAGNOSIS — I110 Hypertensive heart disease with heart failure: Secondary | ICD-10-CM | POA: Diagnosis not present

## 2022-02-24 DIAGNOSIS — Z7984 Long term (current) use of oral hypoglycemic drugs: Secondary | ICD-10-CM | POA: Insufficient documentation

## 2022-02-24 DIAGNOSIS — E66813 Obesity, class 3: Secondary | ICD-10-CM | POA: Diagnosis present

## 2022-02-24 NOTE — ED Provider Notes (Signed)
[facility] Emergency Department Provider Note MRN:  [id number]  Arrival date & time: 02/25/22     Chief Complaint   Code Stroke   History of Present Illness   Lawrence [name] is a 66 y.o. year-old male presents to the ED with chief complaint of left arm and leg weakness.  Had been in normal state of health this morning.  Brought by EMS as a CODE stroke with LKW of 2200.  He complains of headache.  He takes eliquis.  No known fall.  History provided by patient.   Review of Systems  Pertinent review of systems noted in HPI.    Physical Exam   Vitals:   02/25/22 0028 02/25/22 0029  BP:    Pulse: 95 95  Resp: 14 17  Temp:    SpO2: 90% 91%    CONSTITUTIONAL:  chronically ill-appearing, NAD NEURO:  Alert and oriented x 3, left sided facial droop EYES:  eyes equal and reactive ENT/NECK:  Supple, no stridor  CARDIO: Normal rate PULM:  No respiratory distress,  GI/GU:  non-distended, high BMI MSK/SPINE:  No gross deformities, BLE edema and erythema SKIN:  no rash, atraumatic   *Additional and/or pertinent findings included in MDM below  Diagnostic and Interventional Summary    EKG Interpretation  Date/Time:    Ventricular Rate:    PR Interval:    QRS Duration:   QT Interval:    QTC Calculation:   R Axis:     Text Interpretation:         Labs Reviewed  CBC - Abnormal; Notable for the following components:      Result Value   WBC 11.6 (*)    All other components within normal limits  DIFFERENTIAL - Abnormal; Notable for the following components:   Neutro Abs 8.6 (*)    All other components within normal limits  COMPREHENSIVE METABOLIC PANEL - Abnormal; Notable for the following components:   Glucose, Bld 158 (*)    Calcium 8.8 (*)    Total Protein 6.4 (*)    Albumin 3.3 (*)    All other components within normal limits  I-STAT CHEM 8, ED - Abnormal; Notable for the following components:   Glucose, Bld 152 (*)    Calcium, Ion  1.12 (*)    All other components within normal limits  RESP PANEL BY RT-PCR (FLU A&B, COVID) ARPGX2  PROTIME-INR  APTT  ETHANOL  RAPID URINE DRUG SCREEN, HOSP PERFORMED  URINALYSIS, ROUTINE W REFLEX MICROSCOPIC    CT ANGIO HEAD NECK W WO CM (CODE STROKE)  Final Result    CT HEAD CODE STROKE WO CONTRAST  Final Result      Medications  iohexol (OMNIPAQUE) 350 MG/ML injection 100 mL (60 mLs Intravenous Contrast Given 02/25/22 0014)     Procedures  /  Critical Care Procedures  ED Course and Medical Decision Making  I have reviewed the triage vital signs, the nursing notes, and pertinent available records from the EMR.  Social Determinants Affecting Complexity of Care: Patient has no clinically significant social determinants affecting this chief complaint..   ED Course:    Medical Decision Making Patient brought in as a code stroke.  Brought in by EMS.  Had new onset left-sided weakness in upper and lower extremities, last known well 2200, patient takes Eliquis.  Patient was seen in the bridge by me and by neurology.  No focal stroke seen on noncontrast CT.  TNK not ordered as per neurology  recommendation.  Patient will need admission to hospital for stroke work-up.  Neurology following.  Amount and/or Complexity of Data Reviewed Labs: ordered. Radiology: independent interpretation performed.    Details: No ICH seen on head CT  Risk Decision regarding hospitalization.     Consultants: Dr. Lorrin Goodell- Neurology, saw patient with me at the bridge and in CT. Dr. Bridgett Larsson - Hospitalist, appreciated for admitting the patient.   Treatment and Plan: Patient's exam and diagnostic results are concerning for stroke.  Feel that patient will need admission to the hospital for further treatment and evaluation.    Final Clinical Impressions(s) / ED Diagnoses     ICD-10-CM   1. Stroke-like symptoms  R29.90       ED Discharge Orders     None         Discharge Instructions  Discussed with and Provided to Patient:   Discharge Instructions   None      Montine Circle, PA-C 02/25/22 0737    Mesner, Corene Cornea, MD 02/25/22 0225

## 2022-02-25 ENCOUNTER — Observation Stay (HOSPITAL_BASED_OUTPATIENT_CLINIC_OR_DEPARTMENT_OTHER): Payer: No Typology Code available for payment source

## 2022-02-25 ENCOUNTER — Observation Stay (HOSPITAL_COMMUNITY): Payer: No Typology Code available for payment source

## 2022-02-25 ENCOUNTER — Other Ambulatory Visit: Payer: Self-pay

## 2022-02-25 ENCOUNTER — Encounter (HOSPITAL_COMMUNITY): Payer: Self-pay | Admitting: Internal Medicine

## 2022-02-25 ENCOUNTER — Emergency Department (HOSPITAL_COMMUNITY): Payer: No Typology Code available for payment source

## 2022-02-25 DIAGNOSIS — E1169 Type 2 diabetes mellitus with other specified complication: Secondary | ICD-10-CM

## 2022-02-25 DIAGNOSIS — I639 Cerebral infarction, unspecified: Secondary | ICD-10-CM | POA: Diagnosis not present

## 2022-02-25 DIAGNOSIS — J449 Chronic obstructive pulmonary disease, unspecified: Secondary | ICD-10-CM

## 2022-02-25 DIAGNOSIS — E7800 Pure hypercholesterolemia, unspecified: Secondary | ICD-10-CM

## 2022-02-25 DIAGNOSIS — E669 Obesity, unspecified: Secondary | ICD-10-CM

## 2022-02-25 DIAGNOSIS — I6389 Other cerebral infarction: Secondary | ICD-10-CM

## 2022-02-25 DIAGNOSIS — Z789 Other specified health status: Secondary | ICD-10-CM

## 2022-02-25 DIAGNOSIS — IMO0001 Reserved for inherently not codable concepts without codable children: Secondary | ICD-10-CM

## 2022-02-25 DIAGNOSIS — I6381 Other cerebral infarction due to occlusion or stenosis of small artery: Principal | ICD-10-CM

## 2022-02-25 DIAGNOSIS — I480 Paroxysmal atrial fibrillation: Secondary | ICD-10-CM

## 2022-02-25 DIAGNOSIS — I69354 Hemiplegia and hemiparesis following cerebral infarction affecting left non-dominant side: Secondary | ICD-10-CM

## 2022-02-25 DIAGNOSIS — I5032 Chronic diastolic (congestive) heart failure: Secondary | ICD-10-CM | POA: Diagnosis not present

## 2022-02-25 DIAGNOSIS — Z66 Do not resuscitate: Secondary | ICD-10-CM

## 2022-02-25 DIAGNOSIS — G4733 Obstructive sleep apnea (adult) (pediatric): Secondary | ICD-10-CM

## 2022-02-25 LAB — ECHOCARDIOGRAM COMPLETE
AV Mean grad: 20 mmHg
AV Peak grad: 35.9 mmHg
Ao pk vel: 3 m/s
Calc EF: 50.9 %
S' Lateral: 4 cm
Single Plane A2C EF: 53.4 %
Single Plane A4C EF: 46.8 %

## 2022-02-25 LAB — RAPID URINE DRUG SCREEN, HOSP PERFORMED
Amphetamines: NOT DETECTED
Barbiturates: NOT DETECTED
Benzodiazepines: POSITIVE — AB
Cocaine: NOT DETECTED
Opiates: NOT DETECTED
Tetrahydrocannabinol: NOT DETECTED

## 2022-02-25 LAB — APTT: aPTT: 28 seconds (ref 24–36)

## 2022-02-25 LAB — RESP PANEL BY RT-PCR (FLU A&B, COVID) ARPGX2
Influenza A by PCR: NEGATIVE
Influenza B by PCR: NEGATIVE
SARS Coronavirus 2 by RT PCR: NEGATIVE

## 2022-02-25 LAB — I-STAT CHEM 8, ED
BUN: 22 mg/dL (ref 8–23)
Calcium, Ion: 1.12 mmol/L — ABNORMAL LOW (ref 1.15–1.40)
Chloride: 98 mmol/L (ref 98–111)
Creatinine, Ser: 1 mg/dL (ref 0.61–1.24)
Glucose, Bld: 152 mg/dL — ABNORMAL HIGH (ref 70–99)
HCT: 41 % (ref 39.0–52.0)
Hemoglobin: 13.9 g/dL (ref 13.0–17.0)
Potassium: 3.5 mmol/L (ref 3.5–5.1)
Sodium: 138 mmol/L (ref 135–145)
TCO2: 27 mmol/L (ref 22–32)

## 2022-02-25 LAB — COMPREHENSIVE METABOLIC PANEL
ALT: 21 U/L (ref 0–44)
AST: 15 U/L (ref 15–41)
Albumin: 3.3 g/dL — ABNORMAL LOW (ref 3.5–5.0)
Alkaline Phosphatase: 66 U/L (ref 38–126)
Anion gap: 9 (ref 5–15)
BUN: 21 mg/dL (ref 8–23)
CO2: 27 mmol/L (ref 22–32)
Calcium: 8.8 mg/dL — ABNORMAL LOW (ref 8.9–10.3)
Chloride: 100 mmol/L (ref 98–111)
Creatinine, Ser: 1.02 mg/dL (ref 0.61–1.24)
GFR, Estimated: >60 mL/min (ref >60)
Glucose, Bld: 158 mg/dL — ABNORMAL HIGH (ref 70–99)
Potassium: 3.5 mmol/L (ref 3.5–5.1)
Sodium: 136 mmol/L (ref 135–145)
Total Bilirubin: 0.5 mg/dL (ref 0.3–1.2)
Total Protein: 6.4 g/dL — ABNORMAL LOW (ref 6.5–8.1)

## 2022-02-25 LAB — CBG MONITORING, ED
Glucose-Capillary: 148 mg/dL — ABNORMAL HIGH (ref 70–99)
Glucose-Capillary: 105 mg/dL — ABNORMAL HIGH (ref 70–99)
Glucose-Capillary: 150 mg/dL — ABNORMAL HIGH (ref 70–99)

## 2022-02-25 LAB — CBC
HCT: 42.9 % (ref 39.0–52.0)
Hemoglobin: 13.9 g/dL (ref 13.0–17.0)
MCH: 29 pg (ref 26.0–34.0)
MCHC: 32.4 g/dL (ref 30.0–36.0)
MCV: 89.4 fL (ref 80.0–100.0)
Platelets: 244 10*3/uL (ref 150–400)
RBC: 4.8 MIL/uL (ref 4.22–5.81)
RDW: 14.8 % (ref 11.5–15.5)
WBC: 11.6 10*3/uL — ABNORMAL HIGH (ref 4.0–10.5)
nRBC: 0 % (ref 0.0–0.2)

## 2022-02-25 LAB — URINALYSIS, ROUTINE W REFLEX MICROSCOPIC
Bacteria, UA: NONE SEEN
Bilirubin Urine: NEGATIVE
Glucose, UA: >=500 mg/dL — AB
Hgb urine dipstick: NEGATIVE
Ketones, ur: NEGATIVE mg/dL
Leukocytes,Ua: NEGATIVE
Nitrite: NEGATIVE
Protein, ur: NEGATIVE mg/dL
Specific Gravity, Urine: 1.025 (ref 1.005–1.030)
pH: 5 (ref 5.0–8.0)

## 2022-02-25 LAB — DIFFERENTIAL
Abs Immature Granulocytes: 0.07 10*3/uL (ref 0.00–0.07)
Basophils Absolute: 0.1 10*3/uL (ref 0.0–0.1)
Basophils Relative: 1 %
Eosinophils Absolute: 0.4 10*3/uL (ref 0.0–0.5)
Eosinophils Relative: 3 %
Immature Granulocytes: 1 %
Lymphocytes Relative: 15 %
Lymphs Abs: 1.7 10*3/uL (ref 0.7–4.0)
Monocytes Absolute: 0.8 10*3/uL (ref 0.1–1.0)
Monocytes Relative: 7 %
Neutro Abs: 8.6 10*3/uL — ABNORMAL HIGH (ref 1.7–7.7)
Neutrophils Relative %: 73 %

## 2022-02-25 LAB — PROTIME-INR
INR: 1 (ref 0.8–1.2)
Prothrombin Time: 13.4 seconds (ref 11.4–15.2)

## 2022-02-25 LAB — GLUCOSE, CAPILLARY: Glucose-Capillary: 176 mg/dL — ABNORMAL HIGH (ref 70–99)

## 2022-02-25 MED ORDER — ORAL CARE MOUTH RINSE: 15.0000 mL | OROMUCOSAL | Status: DC | PRN

## 2022-02-25 MED ORDER — DAPAGLIFLOZIN PROPANEDIOL 10 MG PO TABS
10.0000 mg | ORAL_TABLET | Freq: Every day | ORAL | Status: DC
  Administered 2022-02-25 – 2022-02-26 (×2): 10 mg via ORAL
  Filled 2022-02-25 (×2): qty 1

## 2022-02-25 MED ORDER — PANTOPRAZOLE SODIUM 40 MG PO TBEC
40.0000 mg | DELAYED_RELEASE_TABLET | Freq: Every day | ORAL | Status: DC
  Administered 2022-02-25 – 2022-02-26 (×2): 40 mg via ORAL
  Filled 2022-02-25 (×2): qty 1

## 2022-02-25 MED ORDER — ACETAMINOPHEN 160 MG/5ML PO SOLN: 650.0000 mg | ORAL | Status: DC | PRN

## 2022-02-25 MED ORDER — IOHEXOL 350 MG/ML SOLN
100.0000 mL | Freq: Once | INTRAVENOUS | Status: AC | PRN
  Administered 2022-02-25: 60 mL via INTRAVENOUS

## 2022-02-25 MED ORDER — LEVETIRACETAM 500 MG PO TABS
500.0000 mg | ORAL_TABLET | Freq: Two times a day (BID) | ORAL | Status: DC
  Administered 2022-02-25 – 2022-02-26 (×3): 500 mg via ORAL
  Filled 2022-02-25 (×3): qty 1

## 2022-02-25 MED ORDER — LORAZEPAM 2 MG/ML IJ SOLN
1.0000 mg | Freq: Once | INTRAMUSCULAR | Status: AC
  Administered 2022-02-25: 1 mg via INTRAVENOUS
  Filled 2022-02-25: qty 1

## 2022-02-25 MED ORDER — SPIRONOLACTONE 25 MG PO TABS
25.0000 mg | ORAL_TABLET | Freq: Every day | ORAL | Status: DC
Start: 2022-02-26 — End: 2022-02-26
  Administered 2022-02-26: 25 mg via ORAL
  Filled 2022-02-25: qty 1

## 2022-02-25 MED ORDER — FLUTICASONE FUROATE-VILANTEROL 200-25 MCG/ACT IN AEPB
1.0000 | INHALATION_SPRAY | Freq: Every day | RESPIRATORY_TRACT | Status: DC
  Administered 2022-02-25 – 2022-02-26 (×2): 1 via RESPIRATORY_TRACT
  Filled 2022-02-25: qty 28

## 2022-02-25 MED ORDER — INSULIN ASPART 100 UNIT/ML IJ SOLN
0.0000 [IU] | Freq: Three times a day (TID) | INTRAMUSCULAR | Status: DC
  Administered 2022-02-25 – 2022-02-26 (×3): 2 [IU] via SUBCUTANEOUS
  Administered 2022-02-26: 3 [IU] via SUBCUTANEOUS

## 2022-02-25 MED ORDER — PREGABALIN 50 MG PO CAPS
150.0000 mg | ORAL_CAPSULE | Freq: Three times a day (TID) | ORAL | Status: DC
  Administered 2022-02-25 – 2022-02-26 (×5): 150 mg via ORAL
  Filled 2022-02-25 (×3): qty 1
  Filled 2022-02-25 (×2): qty 2

## 2022-02-25 MED ORDER — ONDANSETRON HCL 4 MG/2ML IJ SOLN
4.0000 mg | Freq: Four times a day (QID) | INTRAMUSCULAR | Status: DC | PRN
Start: 2022-02-25 — End: 2022-02-26

## 2022-02-25 MED ORDER — TORSEMIDE 20 MG PO TABS
80.0000 mg | ORAL_TABLET | Freq: Every morning | ORAL | Status: DC
Start: 2022-02-26 — End: 2022-02-26
  Administered 2022-02-26: 80 mg via ORAL
  Filled 2022-02-25: qty 4

## 2022-02-25 MED ORDER — PRAZOSIN HCL 2 MG PO CAPS
4.0000 mg | ORAL_CAPSULE | Freq: Every day | ORAL | Status: DC
  Administered 2022-02-25: 4 mg via ORAL
  Filled 2022-02-25 (×3): qty 2

## 2022-02-25 MED ORDER — DILTIAZEM HCL ER COATED BEADS 180 MG PO CP24: 180.0000 mg | ORAL_CAPSULE | Freq: Every day | ORAL | Status: DC

## 2022-02-25 MED ORDER — LACOSAMIDE 50 MG PO TABS
50.0000 mg | ORAL_TABLET | Freq: Two times a day (BID) | ORAL | Status: DC
  Administered 2022-02-25 – 2022-02-26 (×3): 50 mg via ORAL
  Filled 2022-02-25 (×3): qty 1

## 2022-02-25 MED ORDER — APIXABAN 5 MG PO TABS
5.0000 mg | ORAL_TABLET | Freq: Two times a day (BID) | ORAL | Status: DC
Start: 2022-02-25 — End: 2022-02-26
  Administered 2022-02-25 – 2022-02-26 (×3): 5 mg via ORAL
  Filled 2022-02-25 (×4): qty 1

## 2022-02-25 MED ORDER — ACETAMINOPHEN 650 MG RE SUPP: 650.0000 mg | RECTAL | Status: DC | PRN

## 2022-02-25 MED ORDER — UMECLIDINIUM BROMIDE 62.5 MCG/ACT IN AEPB
1.0000 | INHALATION_SPRAY | Freq: Every day | RESPIRATORY_TRACT | Status: DC
  Administered 2022-02-25 – 2022-02-26 (×2): 1 via RESPIRATORY_TRACT
  Filled 2022-02-25 (×2): qty 7

## 2022-02-25 MED ORDER — STROKE: EARLY STAGES OF RECOVERY BOOK: Freq: Once | Status: DC

## 2022-02-25 MED ORDER — METOPROLOL SUCCINATE ER 25 MG PO TB24: 50.0000 mg | ORAL_TABLET | Freq: Every day | ORAL | Status: DC

## 2022-02-25 MED ORDER — ACETAMINOPHEN 325 MG PO TABS
650.0000 mg | ORAL_TABLET | ORAL | Status: DC | PRN
  Administered 2022-02-25 – 2022-02-26 (×3): 650 mg via ORAL
  Filled 2022-02-25 (×3): qty 2

## 2022-02-25 MED ORDER — ICOSAPENT ETHYL 1 G PO CAPS
2.0000 g | ORAL_CAPSULE | Freq: Two times a day (BID) | ORAL | Status: DC
  Administered 2022-02-25 – 2022-02-26 (×3): 2 g via ORAL
  Filled 2022-02-25 (×4): qty 2

## 2022-02-25 MED ORDER — ASPIRIN 81 MG PO TBEC
81.0000 mg | DELAYED_RELEASE_TABLET | Freq: Every day | ORAL | Status: DC
  Administered 2022-02-25 – 2022-02-26 (×2): 81 mg via ORAL
  Filled 2022-02-25 (×2): qty 1

## 2022-02-25 MED ORDER — ISOSORBIDE MONONITRATE ER 30 MG PO TB24: 30.0000 mg | ORAL_TABLET | Freq: Every evening | ORAL | Status: DC

## 2022-02-25 MED ORDER — ATORVASTATIN CALCIUM 40 MG PO TABS
40.0000 mg | ORAL_TABLET | Freq: Every evening | ORAL | Status: DC
  Administered 2022-02-25: 40 mg via ORAL
  Filled 2022-02-25: qty 1

## 2022-02-25 MED ORDER — TORSEMIDE 20 MG PO TABS: 60.0000 mg | ORAL_TABLET | Freq: Every evening | ORAL | Status: DC

## 2022-02-25 MED ORDER — TORSEMIDE 20 MG PO TABS
60.0000 mg | ORAL_TABLET | Freq: Every evening | ORAL | Status: DC
Start: 2022-02-25 — End: 2022-02-26
  Administered 2022-02-25: 60 mg via ORAL
  Filled 2022-02-25 (×3): qty 3

## 2022-02-25 MED ORDER — ISOSORBIDE MONONITRATE ER 30 MG PO TB24
30.0000 mg | ORAL_TABLET | Freq: Every evening | ORAL | Status: DC
Start: 2022-02-26 — End: 2022-02-26

## 2022-02-25 MED ORDER — DILTIAZEM HCL 30 MG PO TABS
30.0000 mg | ORAL_TABLET | Freq: Four times a day (QID) | ORAL | Status: DC
  Administered 2022-02-25 – 2022-02-26 (×4): 30 mg via ORAL
  Filled 2022-02-25 (×5): qty 1

## 2022-02-25 MED ORDER — SPIRONOLACTONE 25 MG PO TABS: 25.0000 mg | ORAL_TABLET | Freq: Every day | ORAL | Status: DC

## 2022-02-25 MED ORDER — TORSEMIDE 20 MG PO TABS: 80.0000 mg | ORAL_TABLET | Freq: Every morning | ORAL | Status: DC

## 2022-02-25 MED ORDER — INSULIN ASPART 100 UNIT/ML IJ SOLN: 0.0000 [IU] | Freq: Every day | INTRAMUSCULAR | Status: DC

## 2022-02-25 MED ORDER — METOPROLOL SUCCINATE ER 50 MG PO TB24
50.0000 mg | ORAL_TABLET | Freq: Every day | ORAL | Status: DC
Start: 2022-02-26 — End: 2022-02-26
  Administered 2022-02-26: 50 mg via ORAL
  Filled 2022-02-25: qty 1

## 2022-02-25 MED ORDER — HEPARIN SODIUM (PORCINE) 5000 UNIT/ML IJ SOLN
5000.0000 [IU] | Freq: Three times a day (TID) | INTRAMUSCULAR | Status: DC
  Administered 2022-02-25 (×2): 5000 [IU] via SUBCUTANEOUS
  Filled 2022-02-25 (×2): qty 1

## 2022-02-25 MED ORDER — INSULIN GLARGINE-YFGN 100 UNIT/ML ~~LOC~~ SOLN
10.0000 [IU] | Freq: Every day | SUBCUTANEOUS | Status: DC
  Administered 2022-02-25: 10 [IU] via SUBCUTANEOUS
  Filled 2022-02-25 (×2): qty 0.1

## 2022-02-25 NOTE — Subjective & Objective (Signed)
Chief complaint: Slurred speech, left-sided weakness. History present illness: 66 year old Caucasian male history of morbid obesity, OSA, type 2 diabetes, history of prior stroke with residual left-sided hemiparesis, chronic back pain status post spinal stimulator, history of DNR, COPD, paroxysmal A-fib on Eliquis who presents to the ER with last known normal around 9 PM.  Patient had gone to the [facility] as usual on [facility]/24/2022.  On the evening of 02/24/2022 around 9:00, the wife noted the patient having some shaking of the right arm.  He had a left facial droop.  His left side including his arm and legs seem weaker than normal.  He was having dysarthria and slurred speech.  She called 911.  Patient brought to the ER.  Initial CT scan of the head along with CT angio were negative.  Neurology has seen the patient.  Patient has continued left-sided hemiparesis.  Neurology concern for small stroke.  MRI brain has been ordered.  Patient's dysarthria has improved but he still having residual left-sided weakness that is worse than normal for him according to wife.  [facility] hospitalist contacted for admission.  Patient denies any recent chest pain, nausea, vomiting, diarrhea.

## 2022-02-25 NOTE — ED Notes (Signed)
Pt returned from MRI °

## 2022-02-25 NOTE — ED Notes (Signed)
ED TO INPATIENT HANDOFF REPORT  ED Nurse Name and Phone #: Andee Poles [phone]  S Name/Age/Gender Lawrence [name] 66 y.o. male Room/Bed: RESUSC/RESUSC  Code Status   Code Status: DNR  Home/SNF/Other Home Patient oriented to: self, place, time and situation Is this baseline? Yes   Triage Complete: Triage complete  Chief Complaint Ischemic stroke [facility]) [I63.9]  Triage Note Pt via [facility] as activated code stroke; sudden onset R sided "worst HA ever." Pt w noted slurred speech, increased weakness, inability to bear weight on L leg, L sided facial droop, inability to perform ADLs. L sided deficits at baseline from hx CVA, on eliquis.   102/64 HR 98 CBG 147 LVO 4   Allergies Allergies  Allergen Reactions   Vancomycin Other (See Comments)    "Red Man Syndrome" 02/02/17: possible cause for rash under both arms   Niacin Other (See Comments)   Niacin And Related Other (See Comments)    Red man syndrome   Tubersol [Tuberculin, Ppd] Other (See Comments)    Reaction unknown   Doxycycline Rash and Other (See Comments)    Level of Care/Admitting Diagnosis ED Disposition     ED Disposition  Admit   Condition  --   [place]: [place] [[id number]]  Level of Care: Telemetry Medical [[id number]]  May place patient in observation at [facility] or [place] if equivalent level of care is available:: No  Covid Evaluation: Asymptomatic - no recent exposure (last 10 days) testing not required  Diagnosis: Ischemic stroke [facility]) [[id number]]  Admitting Physician: Bridgett Larsson, [place]  Attending Physician: Bridgett Larsson, ERIC [[id number]]          B Medical/Surgery History Past Medical History:  Diagnosis Date   Acquired dilation of ascending aorta and aortic root ([place])    34m by echo 01/2021   Adenomatous colon polyp 2007   Anemia    Anxiety    Aortic stenosis    moderate AS by echo 01/2021   Asthma    BPH without obstruction/lower urinary tract symptoms 02/22/2017    Chronic diastolic (congestive) heart failure ([facility]    Chronic venous stasis 03/07/2019   COPD (chronic obstructive pulmonary disease) [place]    Coronary artery calcification seen on CAT scan    Depression    Diabetic neuropathy ([place] 09/11/2019   History of colon polyps 08/24/2018   Hypertension    Morbid obesity (HCC)    OSA (obstructive sleep apnea)    Pain due to onychomycosis of toenails of both feet 09/11/2019   Peripheral neuropathy 02/22/2017   Primary osteoarthritis, left shoulder 03/05/2017   PTSD (post-traumatic stress disorder)    Pure hypercholesterolemia    QT prolongation 03/07/2019   Seizures (HCC)    Shortness of breath    Sinus tachycardia 03/07/2019   Sleep apnea    CPAP   Type 2 diabetes mellitus with vascular disease ([place] 09/11/2019   Past Surgical History:  Procedure Laterality Date   ENDOVENOUS ABLATION SAPHENOUS VEIN W/ LASER Right 08/20/2020   endovenous laser ablation right greater saphenous vein by CGae GallopMD    JOINT REPLACEMENT     left knee replacement x 2   KNEE ARTHROSCOPY Bilateral    LEFT HEART CATH AND CORONARY ANGIOGRAPHY N/A 01/17/2018   Procedure: LEFT HEART CATH AND CORONARY ANGIOGRAPHY;  Surgeon: HLeonie Man MD;  [place] [facility];  Service: Cardiovascular;  Laterality: N/A;   PRESSURE SENSOR/CARDIOMEMS N/A 08/26/2021   Procedure: PRESSURE SENSOR/CARDIOMEMS;  Surgeon: Larey Dresser, MD;  Location: [place] [facility];  Service: Cardiovascular;  Laterality: N/A;   RIGHT HEART CATH N/A 08/26/2021   Procedure: RIGHT HEART CATH;  Surgeon: Larey Dresser, MD;  Location: [place] [facility];  Service: Cardiovascular;  Laterality: N/A;   TEE WITHOUT CARDIOVERSION N/A 08/26/2021   Procedure: TRANSESOPHAGEAL ECHOCARDIOGRAM (TEE);  Surgeon: Larey Dresser, MD;  Location: [facility];  Service: Cardiovascular;  Laterality: N/A;   UMBILICAL HERNIA REPAIR       A IV Location/Drains/Wounds Patient Lines/Drains/Airways  Status     Active Line/Drains/Airways     Name Placement date Placement time Site Days   Peripheral IV 02/25/22 18 G Left Antecubital 02/25/22  0000  Antecubital  less than 1   Wound / Incision (Open or Dehisced) 12/05/19 Leg Right;Left 12/05/19  2200  Leg  813            Intake/Output Last 24 hours  Intake/Output Summary (Last 24 hours) at 02/25/2022 1827 Last data filed at 02/25/2022 0945 Gross per 24 hour  Intake --  Output 600 ml  Net -600 ml    Labs/Imaging Results for orders placed or performed during the hospital encounter of 02/24/22 (from the past 48 hour(s))  Protime-INR     Status: None   Collection Time: 02/24/22 12:18 AM  Result Value Ref Range   Prothrombin Time 13.4 11.4 - 15.2 seconds   INR 1.0 0.8 - 1.2    Comment: (NOTE) INR goal varies based on device and disease states. Performed at [facility], [place] [address], [place], [place] [address]   APTT     Status: None   Collection Time: 02/24/22 12:18 AM  Result Value Ref Range   aPTT 28 24 - 36 seconds    Comment: Performed at [facility] [address] [place], [place] [address]  CBC     Status: Abnormal   Collection Time: 02/24/22 12:18 AM  Result Value Ref Range   WBC 11.6 (H) 4.0 - 10.5 K/uL   RBC 4.80 4.22 - 5.81 MIL/uL   Hemoglobin 13.9 13.0 - 17.0 g/dL   HCT 42.9 39.0 - 52.0 %   MCV 89.4 80.0 - 100.0 fL   MCH 29.0 26.0 - 34.0 pg   MCHC 32.4 30.0 - 36.0 g/dL   RDW 14.8 11.5 - 15.5 %   Platelets 244 150 - 400 K/uL   nRBC 0.0 0.0 - 0.2 %    Comment: Performed at [facility], [place] [address], [place], [place] [address]  Differential     Status: Abnormal   Collection Time: 02/24/22 12:18 AM  Result Value Ref Range   Neutrophils Relative % 73 %   Neutro Abs 8.6 (H) 1.7 - 7.7 K/uL   Lymphocytes Relative 15 %   Lymphs Abs 1.7 0.7 - 4.0 K/uL   Monocytes Relative 7 %   Monocytes Absolute 0.8 0.1 - 1.0 K/uL   Eosinophils Relative 3 %   Eosinophils Absolute 0.4 0.0 - 0.5  K/uL   Basophils Relative 1 %   Basophils Absolute 0.1 0.0 - 0.1 K/uL   Immature Granulocytes 1 %   Abs Immature Granulocytes 0.07 0.00 - 0.07 K/uL    Comment: Performed at [facility] [address], [place], [place] [address]  Comprehensive metabolic panel     Status: Abnormal   Collection Time: 02/24/22 12:18 AM  Result Value Ref Range   Sodium 136 135 -  145 mmol/L   Potassium 3.5 3.5 - 5.1 mmol/L   Chloride 100 98 - 111 mmol/L   CO2 27 22 - 32 mmol/L   Glucose, Bld 158 (H) 70 - 99 mg/dL    Comment: Glucose reference range applies only to samples taken after fasting for at least 8 hours.   BUN 21 8 - 23 mg/dL   Creatinine, Ser 1.02 0.61 - 1.24 mg/dL   Calcium 8.8 (L) 8.9 - 10.3 mg/dL   Total Protein 6.4 (L) 6.5 - 8.1 g/dL   Albumin 3.3 (L) 3.5 - 5.0 g/dL   AST 15 15 - 41 U/L   ALT 21 0 - 44 U/L   Alkaline Phosphatase 66 38 - 126 U/L   Total Bilirubin 0.5 0.3 - 1.2 mg/dL   GFR, Estimated >60 >60 mL/min    Comment: (NOTE) Calculated using the CKD-EPI Creatinine Equation (2021)    Anion gap 9 5 - 15    Comment: Performed at [facility], [place] [address] [place], [place] [address]  I-stat chem 8, ED     Status: Abnormal   Collection Time: 02/25/22 12:19 AM  Result Value Ref Range   Sodium 138 135 - 145 mmol/L   Potassium 3.5 3.5 - 5.1 mmol/L   Chloride 98 98 - 111 mmol/L   BUN 22 8 - 23 mg/dL   Creatinine, Ser 1.00 0.61 - 1.24 mg/dL   Glucose, Bld 152 (H) 70 - 99 mg/dL    Comment: Glucose reference range applies only to samples taken after fasting for at least 8 hours.   Calcium, Ion 1.12 (L) 1.15 - 1.40 mmol/L   TCO2 27 22 - 32 mmol/L   Hemoglobin 13.9 13.0 - 17.0 g/dL   HCT 41.0 39.0 - 52.0 %  Resp Panel by RT-PCR (Flu A&B, Covid) Anterior Nasal Swab     Status: None   Collection Time: 02/25/22  1:19 AM   Specimen: Anterior Nasal Swab  Result Value Ref Range   SARS Coronavirus 2 by RT PCR NEGATIVE NEGATIVE    Comment: (NOTE) SARS-CoV-2 target nucleic  acids are NOT DETECTED.  The SARS-CoV-2 RNA is generally detectable in upper respiratory specimens during the acute phase of infection. The lowest concentration of SARS-CoV-2 viral copies this assay can detect is 138 copies/mL. A negative result does not preclude SARS-Cov-2 infection and should not be used as the sole basis for treatment or other patient management decisions. A negative result may occur with  improper specimen collection/handling, submission of specimen other than nasopharyngeal swab, presence of viral mutation(s) within the areas targeted by this assay, and inadequate number of viral copies(<138 copies/mL). A negative result must be combined with clinical observations, patient history, and epidemiological information. The expected result is Negative.  Fact Sheet for Patients:  EntrepreneurPulse.com.au  Fact Sheet for Healthcare Providers:  IncredibleEmployment.be  This test is no t yet approved or cleared by the [place] FDA and  has been authorized for detection and/or diagnosis of SARS-CoV-2 by FDA under an Emergency Use Authorization (EUA). This EUA will remain  in effect (meaning this test can be used) for the duration of the COVID-19 declaration under Section 564(b)(1) of the Act, 21 U.S.C.section 360bbb-3(b)(1), unless the authorization is terminated  or revoked sooner.       Influenza A by PCR NEGATIVE NEGATIVE   Influenza B by PCR NEGATIVE NEGATIVE    Comment: (NOTE) The Xpert Xpress SARS-CoV-2/FLU/RSV plus assay is intended as an aid in the diagnosis  of influenza from Nasopharyngeal swab specimens and should not be used as a sole basis for treatment. Nasal washings and aspirates are unacceptable for Xpert Xpress SARS-CoV-2/FLU/RSV testing.  Fact Sheet for Patients: EntrepreneurPulse.com.au  Fact Sheet for Healthcare Providers: IncredibleEmployment.be  This test is not  yet approved or cleared by the [place] FDA and has been authorized for detection and/or diagnosis of SARS-CoV-2 by FDA under an Emergency Use Authorization (EUA). This EUA will remain in effect (meaning this test can be used) for the duration of the COVID-19 declaration under Section 564(b)(1) of the Act, 21 U.S.C. section 360bbb-3(b)(1), unless the authorization is terminated or revoked.  Performed at [facility], [place] [address] [place], [place] [address]   CBG monitoring, ED     Status: Abnormal   Collection Time: 02/25/22  8:11 AM  Result Value Ref Range   Glucose-Capillary 148 (H) 70 - 99 mg/dL    Comment: Glucose reference range applies only to samples taken after fasting for at least 8 hours.  Urine rapid drug screen (hosp performed)     Status: Abnormal   Collection Time: 02/25/22  9:50 AM  Result Value Ref Range   Opiates NONE DETECTED NONE DETECTED   Cocaine NONE DETECTED NONE DETECTED   Benzodiazepines POSITIVE (A) NONE DETECTED   Amphetamines NONE DETECTED NONE DETECTED   Tetrahydrocannabinol NONE DETECTED NONE DETECTED   Barbiturates NONE DETECTED NONE DETECTED    Comment: (NOTE) DRUG SCREEN FOR MEDICAL PURPOSES ONLY.  IF CONFIRMATION IS NEEDED FOR ANY PURPOSE, NOTIFY LAB WITHIN 5 DAYS.  LOWEST DETECTABLE LIMITS FOR URINE DRUG SCREEN Drug Class                     Cutoff (ng/mL) Amphetamine and metabolites    1000 Barbiturate and metabolites    200 Benzodiazepine                 765 Tricyclics and metabolites     300 Opiates and metabolites        300 Cocaine and metabolites        300 THC                            50 Performed at [facility], [place] [address], [place], [place] [address]   Urinalysis, Routine w reflex microscopic Urine, Clean Catch     Status: Abnormal   Collection Time: 02/25/22  9:50 AM  Result Value Ref Range   Color, Urine YELLOW YELLOW   APPearance CLEAR CLEAR   Specific Gravity, Urine 1.025 1.005 - 1.030   pH 5.0  5.0 - 8.0   Glucose, UA >=500 (A) NEGATIVE mg/dL   Hgb urine dipstick NEGATIVE NEGATIVE   Bilirubin Urine NEGATIVE NEGATIVE   Ketones, ur NEGATIVE NEGATIVE mg/dL   Protein, ur NEGATIVE NEGATIVE mg/dL   Nitrite NEGATIVE NEGATIVE   Leukocytes,Ua NEGATIVE NEGATIVE   RBC / HPF 0-5 0 - 5 RBC/hpf   WBC, UA 0-5 0 - 5 WBC/hpf   Bacteria, UA NONE SEEN NONE SEEN   Squamous Epithelial / LPF 0-5 0 - 5    Comment: Performed at [facility], [place] [address], [place], [place] [address]  CBG monitoring, ED     Status: Abnormal   Collection Time: 02/25/22  1:08 PM  Result Value Ref Range   Glucose-Capillary 105 (H) 70 - 99 mg/dL    Comment: Glucose reference range applies  only to samples taken after fasting for at least 8 hours.  CBG monitoring, ED     Status: Abnormal   Collection Time: 02/25/22  5:48 PM  Result Value Ref Range   Glucose-Capillary 150 (H) 70 - 99 mg/dL    Comment: Glucose reference range applies only to samples taken after fasting for at least 8 hours.   ECHOCARDIOGRAM COMPLETE  Result Date: 02/25/2022    ECHOCARDIOGRAM REPORT   Patient Name:   KIEON LAWHORN Date of Exam: 02/25/2022 Medical Rec #:  [id number]        Height:       74.0 in Accession #:    [id number]       Weight:       369.0 lb Date of Birth:  10/29/1955        BSA:          2.820 m Patient Age:    43 years         BP:           113/77 mmHg Patient Gender: M                HR:           94 bpm. Exam Location:  Inpatient Procedure: 2D Echo, Color Doppler and Cardiac Doppler Indications:     Stroke I63.9  History:         Patient has prior history of Echocardiogram examinations, most                  recent 08/10/2021. CHF, CAD, CardioMEMS, COPD, Aortic Valve                  Disease, Arrythmias:Tachycardia; Risk Factors:Diabetes, Sleep                  Apnea, Obesity and Former Smoker.  Sonographer:     Eartha Inch Referring Phys:  [id number] ERIC CHEN Diagnosing Phys: Jenkins Rouge MD  Sonographer Comments: Patient is  morbidly obese and suboptimal parasternal window. Image acquisition challenging due to patient body habitus. IMPRESSIONS  1. Left ventricular ejection fraction, by estimation, is 60 to 65%. The left ventricle has normal function. The left ventricle has no regional wall motion abnormalities. Left ventricular diastolic parameters are indeterminate.  2. Right ventricular systolic function is normal. The right ventricular size is normal.  3. The mitral valve is abnormal. Trivial mitral valve regurgitation. Moderate mitral annular calcification.  4. No mean gradient recorded peak velocity around 3 m/sec suggesting moderate AS with peak gradient 36 mmHg Tech went back to do more dopper. Peak velocity 3.2 m/sec mean gradient 29 peak 41 mmHg moderate AS . The aortic valve is tricuspid. Aortic valve  regurgitation is not visualized. Moderate aortic valve stenosis.  5. Aortic dilatation noted. There is mild dilatation of the ascending aorta, measuring 39 mm. FINDINGS  Left Ventricle: Left ventricular ejection fraction, by estimation, is 60 to 65%. The left ventricle has normal function. The left ventricle has no regional wall motion abnormalities. The left ventricular internal cavity size was normal in size. There is  no left ventricular hypertrophy. Left ventricular diastolic parameters are indeterminate. Right Ventricle: The right ventricular size is normal. Right vetricular wall thickness was not assessed. Right ventricular systolic function is normal. Left Atrium: Left atrial size was normal in size. Right Atrium: Right atrial size was normal in size. Pericardium: Trivial pericardial effusion is present. The pericardial effusion is anterior to the right ventricle. Mitral Valve:  The mitral valve is abnormal. There is mild thickening of the mitral valve leaflet(s). There is mild calcification of the mitral valve leaflet(s). Moderate mitral annular calcification. Trivial mitral valve regurgitation. Tricuspid Valve: The  tricuspid valve is normal in structure. Tricuspid valve regurgitation is mild. Aortic Valve: No mean gradient recorded peak velocity around 3 m/sec suggesting moderate AS with peak gradient 36 mmHg Tech went back to do more dopper. Peak velocity 3.2 m/sec mean gradient 29 peak 41 mmHg moderate AS. The aortic valve is tricuspid. Aortic valve regurgitation is not visualized. Moderate aortic stenosis is present. Aortic valve mean gradient measures 20.0 mmHg. Aortic valve peak gradient measures 35.9 mmHg. Pulmonic Valve: The pulmonic valve was not well visualized. Pulmonic valve regurgitation is not visualized. Aorta: Aortic dilatation noted. There is mild dilatation of the ascending aorta, measuring 39 mm. IAS/Shunts: The interatrial septum was not well visualized.  LEFT VENTRICLE PLAX 2D LVOT diam:     2.73 cm LVOT Area:     5.86 cm  AORTIC VALVE AV Vmax:      299.60 cm/s AV Vmean:     208.000 cm/s AV VTI:       0.556 m AV Peak Grad: 35.9 mmHg AV Mean Grad: 20.0 mmHg  SHUNTS Systemic Diam: 2.73 cm Jenkins Rouge MD Electronically signed by Jenkins Rouge MD Signature Date/Time: 02/25/2022/12:02:32 PM    Final (Updated)    MR BRAIN WO CONTRAST  Result Date: 02/25/2022 CLINICAL DATA:  Provided history: Neuro deficit, acute, stroke suspected. EXAM: MRI HEAD WITHOUT CONTRAST TECHNIQUE: Multiplanar, multiecho pulse sequences of the brain and surrounding structures were obtained without intravenous contrast. COMPARISON:  CT angiogram head/neck 02/25/2022. Non-contrast head CT 02/24/2022. Brain MRI 04/27/2021. FINDINGS: Intermittently motion degraded examination, limiting evaluation and most notably as follows. Severe motion degradation of the sagittal T1 weighted sequence. Moderate motion degradation of the axial T2 sequence. Moderate motion degradation of the axial T2 FLAIR sequence. Severe motion degradation of the axial T1 weighted sequence. Moderate motion degradation of the axial SWI sequence. Moderate to severe motion  degradation of the coronal T2 sequence. Brain: No age advanced or lobar predominant parenchymal atrophy. The axial diffusion-weighted imaging is of good quality. No evidence of acute infarct. Within the limitations of motion degradation, no cortical encephalomalacia is identified. No significant cerebral white matter disease for age. No appreciable intracranial mass or extra-axial fluid collection. No midline shift. Vascular: Maintained flow voids within the proximal large arterial vessels. Skull and upper cervical spine: Within described limitations, no focal suspicious marrow lesion is identified. Sinuses/Orbits: No mass or acute finding within the imaged orbits. Mild mucosal thickening within the inferior right maxillary sinus, and within the bilateral ethmoid air cells. IMPRESSION: Significantly motion degraded examination, as described and limiting evaluation. No evidence of acute intracranial abnormality. Specifically, the axial diffusion-weighted sequence is of good quality and there is no evidence of acute infarct. Electronically Signed   By: Kellie Simmering D.O.   On: 02/25/2022 12:59   CT ANGIO HEAD NECK W WO CM (CODE STROKE)  Result Date: 02/25/2022 CLINICAL DATA:  Code stroke EXAM: CT ANGIOGRAPHY HEAD AND NECK TECHNIQUE: Multidetector CT imaging of the head and neck was performed using the standard protocol during bolus administration of intravenous contrast. Multiplanar CT image reconstructions and MIPs were obtained to evaluate the vascular anatomy. Carotid stenosis measurements (when applicable) are obtained utilizing NASCET criteria, using the distal internal carotid diameter as the denominator. RADIATION DOSE REDUCTION: This exam was performed according to the departmental dose-optimization program  which includes automated exposure control, adjustment of the mA and/or kV according to patient size and/or use of iterative reconstruction technique. CONTRAST:  23m OMNIPAQUE IOHEXOL 350 MG/ML SOLN  COMPARISON:  None Available. FINDINGS: CTA NECK FINDINGS SKELETON: There is no bony spinal canal stenosis. No lytic or blastic lesion. OTHER NECK: Normal pharynx, larynx and major salivary glands. No cervical lymphadenopathy. Unremarkable thyroid gland. UPPER CHEST: No pneumothorax or pleural effusion. No nodules or masses. AORTIC ARCH: There is calcific atherosclerosis of the aortic arch. There is no aneurysm, dissection or hemodynamically significant stenosis of the visualized portion of the aorta. Conventional 3 vessel aortic branching pattern. The visualized proximal subclavian arteries are widely patent. RIGHT CAROTID SYSTEM: Normal without aneurysm, dissection or stenosis. LEFT CAROTID SYSTEM: Normal without aneurysm, dissection or stenosis. VERTEBRAL ARTERIES: Left dominant configuration. Both origins are clearly patent. There is no dissection, occlusion or flow-limiting stenosis to the skull base (V1-V3 segments). CTA HEAD FINDINGS POSTERIOR CIRCULATION: --Vertebral arteries: Normal V4 segments. --Inferior cerebellar arteries: Normal. --Basilar artery: Normal. --Superior cerebellar arteries: Normal. --Posterior cerebral arteries (PCA): Normal. ANTERIOR CIRCULATION: --Intracranial internal carotid arteries: Normal. --Anterior cerebral arteries (ACA): Normal. Both A1 segments are present. Patent anterior communicating artery (a-comm). --Middle cerebral arteries (MCA): Normal. VENOUS SINUSES: As permitted by contrast timing, patent. ANATOMIC VARIANTS: None Review of the MIP images confirms the above findings. IMPRESSION: 1. No emergent large vessel occlusion or high-grade stenosis of the intracranial arteries. 2. Aortic Atherosclerosis (ICD10-I70.0). Electronically Signed   By: KUlyses JarredM.D.   On: 02/25/2022 00:36   CT HEAD CODE STROKE WO CONTRAST  Result Date: 02/25/2022 CLINICAL DATA:  Code stroke.  Acute neurologic deficit EXAM: CT HEAD WITHOUT CONTRAST TECHNIQUE: Contiguous axial images were  obtained from the base of the skull through the vertex without intravenous contrast. RADIATION DOSE REDUCTION: This exam was performed according to the departmental dose-optimization program which includes automated exposure control, adjustment of the mA and/or kV according to patient size and/or use of iterative reconstruction technique. COMPARISON:  None Available. FINDINGS: Brain: There is no mass, hemorrhage or extra-axial collection. The size and configuration of the ventricles and extra-axial CSF spaces are normal. There is hypoattenuation of the periventricular white matter, most commonly indicating chronic ischemic microangiopathy. Vascular: No abnormal hyperdensity of the major intracranial arteries or dural venous sinuses. No intracranial atherosclerosis. Skull: The visualized skull base, calvarium and extracranial soft tissues are normal. Sinuses/Orbits: No fluid levels or advanced mucosal thickening of the visualized paranasal sinuses. No mastoid or middle ear effusion. The orbits are normal. ASPECTS ([facility] Early CT Score) - Ganglionic level infarction (caudate, lentiform nuclei, internal capsule, insula, M1-M3 cortex): 7 - Supraganglionic infarction (M4-M6 cortex): 3 Total score (0-10 with 10 being normal): 10 IMPRESSION: 1. No acute intracranial abnormality. 2. ASPECTS is 10. These results were communicated to Dr. SDonnetta Simpersat 12:04 am on 02/25/2022 by text page via the [facility] Medmessaging system. Electronically Signed   By: KUlyses JarredM.D.   On: 02/25/2022 00:05    Pending Labs Unresulted Labs (From admission, onward)     Start     Ordered   02/25/22 0301  HIV Antibody (routine testing w rflx)  (HIV Antibody (Routine testing w reflex) panel)  Add-on,   AD        02/25/22 0300   02/25/22 0209  Hemoglobin A1c  Add-on,   AD        02/25/22 0208   02/25/22 0209  Lipid panel  Add-on,   AD  02/25/22 0208   02/24/22 2344  Ethanol  Once,   URGENT        02/24/22 2344             Vitals/Pain Today's Vitals   02/25/22 1143 02/25/22 1500 02/25/22 1756 02/25/22 1800  BP:  (!) 102/55 114/75 110/85  Pulse:  99  100  Resp:  15  (!) 21  Temp:      TempSrc:      SpO2:  92%  94%  PainSc: 5        Isolation Precautions No active isolations  Medications Medications  atorvastatin (LIPITOR) tablet 40 mg (40 mg Oral Given 02/25/22 1755)  icosapent Ethyl (VASCEPA) 1 g capsule 2 g (2 g Oral Given 02/25/22 0939)  prazosin (MINIPRESS) capsule 4 mg (has no administration in time range)  dapagliflozin propanediol (FARXIGA) tablet 10 mg (10 mg Oral Given 02/25/22 0940)  pantoprazole (PROTONIX) EC tablet 40 mg (40 mg Oral Given 02/25/22 0942)  levETIRAcetam (KEPPRA) tablet 500 mg (500 mg Oral Given 02/25/22 0939)  pregabalin (LYRICA) capsule 150 mg (150 mg Oral Given 02/25/22 1600)  fluticasone furoate-vilanterol (BREO ELLIPTA) 200-25 MCG/ACT 1 puff (1 puff Inhalation Given 02/25/22 1139)    And  umeclidinium bromide (INCRUSE ELLIPTA) 62.5 MCG/ACT 1 puff (1 puff Inhalation Given 02/25/22 1139)  insulin glargine-yfgn (SEMGLEE) injection 10 Units (has no administration in time range)  insulin aspart (novoLOG) injection 0-15 Units (2 Units Subcutaneous Given 02/25/22 1755)  insulin aspart (novoLOG) injection 0-5 Units (has no administration in time range)   stroke: early stages of recovery book (has no administration in time range)  acetaminophen (TYLENOL) tablet 650 mg (650 mg Oral Given 02/25/22 0941)    Or  acetaminophen (TYLENOL) 160 MG/5ML solution 650 mg ( Per Tube See Alternative 02/25/22 0941)    Or  acetaminophen (TYLENOL) suppository 650 mg ( Rectal See Alternative 02/25/22 0941)  ondansetron (ZOFRAN) injection 4 mg (has no administration in time range)  aspirin EC tablet 81 mg (81 mg Oral Given 02/25/22 0941)  isosorbide mononitrate (IMDUR) 24 hr tablet 30 mg (has no administration in time range)  metoprolol succinate (TOPROL-XL) 24 hr tablet 50 mg (has no  administration in time range)  spironolactone (ALDACTONE) tablet 25 mg (has no administration in time range)  torsemide (DEMADEX) tablet 80 mg (has no administration in time range)    And  torsemide (DEMADEX) tablet 60 mg (60 mg Oral Given 02/25/22 1754)  apixaban (ELIQUIS) tablet 5 mg (5 mg Oral Given 02/25/22 1600)  lacosamide (VIMPAT) tablet 50 mg (50 mg Oral Given 02/25/22 1603)  diltiazem (CARDIZEM) tablet 30 mg (30 mg Oral Given 02/25/22 1756)  iohexol (OMNIPAQUE) 350 MG/ML injection 100 mL (60 mLs Intravenous Contrast Given 02/25/22 0014)  LORazepam (ATIVAN) injection 1 mg (1 mg Intravenous Given 02/25/22 1145)    Mobility walks with person assist Low fall risk   Focused Assessments Neuro Assessment Handoff:  Swallow screen pass? Yes    NIH Stroke Scale ( + Modified Stroke Scale Criteria)  Interval: Initial Level of Consciousness (1a.)   : Alert, keenly responsive LOC Questions (1b. )   +: Answers both questions correctly LOC Commands (1c. )   + : Performs both tasks correctly Best Gaze (2. )  +: Normal Visual (3. )  +: No visual loss Facial Palsy (4. )    : Minor paralysis Motor Arm, Left (5a. )   +: No drift Motor Arm, Right (5b. )   +: No drift  Motor Leg, Left (6a. )   +: Drift Motor Leg, Right (6b. )   +: No drift Limb Ataxia (7. ): Absent Sensory (8. )   +: Normal, no sensory loss Best Language (9. )   +: No aphasia Dysarthria (10. ): Normal Extinction/Inattention (11.)   +: No Abnormality Modified SS Total  +: 1 Complete NIHSS TOTAL: 6 Last date known well: 02/24/22 Last time known well: 2200 Neuro Assessment: Exceptions to WDL Neuro Checks:   Initial (02/25/22 0028)  Last Documented NIHSS Modified Score: 1 (02/25/22 1759) Has TPA been given? No If patient is a Neuro Trauma and patient is going to OR before floor call report to [place] nurse: [phone] or [phone]   R Recommendations: See Admitting Provider Note  Report given to:   Additional  Notes:   Patient is alert and oriented x4, very pleasant, on 2L nasal cannula, uses CPAP when sleeping.

## 2022-02-25 NOTE — Assessment & Plan Note (Signed)
Chronic.  Stable.  Not exacerbated.  Continue Trelegy inhaler.

## 2022-02-25 NOTE — Evaluation (Signed)
Physical Therapy Evaluation Patient Details Name: Lawrence [name] MRN: [id number] DOB: June 03, 1956 Today's Date: 02/25/2022  History of Present Illness  66 year old male presented to ED with slurred speech, left-sided weakness. CT/CTA-negative, MRI not yet performed.PHMx: morbid obesity, OSA, type 2 diabetes, history of prior stroke with residual left-sided hemiparesis, chronic back pain status post spinal stimulator, COPD, paroxysmal A-fib  Clinical Impression  Pt admitted secondary to problem above with deficits below. Requiring mod A for lift assist to stand and min to min guard for ambulation using RW. Pt reports increased pain with weightbearing, especially in RLE. Recommending HHPT at d/c to address current deficits. Will continue to follow acutely.        Recommendations for follow up therapy are one component of a multi-disciplinary discharge planning process, led by the attending physician.  Recommendations may be updated based on patient status, additional functional criteria and insurance authorization.  Follow Up Recommendations Home health PT      Assistance Recommended at Discharge Frequent or constant Supervision/Assistance  Patient can return home with the following  A little help with walking and/or transfers;A little help with bathing/dressing/bathroom;Assistance with cooking/housework;Assist for transportation    Equipment Recommendations None recommended by PT  Recommendations for Other Services       Functional Status Assessment Patient has had a recent decline in their functional status and demonstrates the ability to make significant improvements in function in a reasonable and predictable amount of time.     Precautions / Restrictions Precautions Precautions: Fall Restrictions Weight Bearing Restrictions: No      Mobility  Bed Mobility Overal bed mobility: Needs Assistance Bed Mobility: Sit to Supine       Sit to supine: Min assist   General bed  mobility comments: Min A for LE assist.    Transfers Overall transfer level: Needs assistance Equipment used: Rolling walker (2 wheels) Transfers: Sit to/from Stand Sit to Stand: Mod assist           General transfer comment: light mod A for lift assist and steadying to stand from lower surface. Increased time required.    Ambulation/Gait Ambulation/Gait assistance: Min guard, Min assist Gait Distance (Feet): 15 Feet Assistive device: Rolling walker (2 wheels) Gait Pattern/deviations: Step-through pattern, Decreased stride length Gait velocity: Decreased     General Gait Details: Ambulated short distance in room. Noted LLE weakness and also noted that R knee moves laterally in stance phase. Pt reports that as baseline.  Stairs            Wheelchair Mobility    Modified Rankin (Stroke Patients Only)       Balance Overall balance assessment: Needs assistance Sitting-balance support: No upper extremity supported, Feet supported Sitting balance-Leahy Scale: Good     Standing balance support: Bilateral upper extremity supported Standing balance-Leahy Scale: Poor Standing balance comment: Reliant on BUE support                             Pertinent Vitals/Pain Pain Assessment Pain Assessment: Faces Faces Pain Scale: Hurts even more Pain Location: back Pain Descriptors / Indicators: Aching, Sore Pain Intervention(s): Limited activity within patient's tolerance, Monitored during session, Repositioned    Home Living Family/patient expects to be discharged to:: Private residence Living Arrangements: Spouse/significant other Available Help at Discharge: Family;Available 24 hours/day Type of Home: House Home Access: Level entry       Home Layout: One level Home Equipment: Transport planner (4 wheels);Grab bars -  tub/shower;Hospital bed Additional Comments: Has a PCA 4 days a week a couple hours a day    Prior Function Prior  Level of Function : Needs assist             Mobility Comments: Rollator for household mobility; electric scooter in community dwellings ADLs Comments: Reports some assist for ADLs from aide (bathing more than dressing). Able to complete toileting with min A.     Hand Dominance   Dominant Hand: Right    Extremity/Trunk Assessment   Upper Extremity Assessment Upper Extremity Assessment: Defer to OT evaluation LUE Deficits / Details: lag compared to RUE for shoulder flexion, groslly 3/5 throughout (pt reports this is below his baseline) LUE Coordination: decreased fine motor;decreased gross motor    Lower Extremity Assessment Lower Extremity Assessment: RLE deficits/detail;LLE deficits/detail RLE Deficits / Details: RLE hyperextension/varus deformity in weightbearing. LLE Deficits / Details: LLE weakness, pt reports it feels close to baseline.    Cervical / Trunk Assessment Cervical / Trunk Assessment: Kyphotic  Communication   Communication: No difficulties  Cognition Arousal/Alertness: Awake/alert Behavior During Therapy: WFL for tasks assessed/performed Overall Cognitive Status: Within Functional Limits for tasks assessed                                          General Comments      Exercises     Assessment/Plan    PT Assessment Patient needs continued PT services  PT Problem List Decreased strength;Decreased activity tolerance;Decreased balance;Decreased mobility;Decreased knowledge of use of DME;Decreased knowledge of precautions;Pain       PT Treatment Interventions DME instruction;Gait training;Therapeutic activities;Functional mobility training;Therapeutic exercise;Balance training;Patient/family education    PT Goals (Current goals can be found in the Care Plan section)  Acute Rehab PT Goals Patient Stated Goal: to go home PT Goal Formulation: With patient Time For Goal Achievement: 03/11/22 Potential to Achieve Goals: Good     Frequency Min 4X/week     Co-evaluation               AM-PAC PT "6 Clicks" Mobility  Outcome Measure Help needed turning from your back to your side while in a flat bed without using bedrails?: A Little Help needed moving from lying on your back to sitting on the side of a flat bed without using bedrails?: A Little Help needed moving to and from a bed to a chair (including a wheelchair)?: A Little Help needed standing up from a chair using your arms (e.g., wheelchair or bedside chair)?: A Lot Help needed to walk in hospital room?: A Little Help needed climbing 3-5 steps with a railing? : A Lot 6 Click Score: 16    End of Session Equipment Utilized During Treatment: Gait belt Activity Tolerance: Patient tolerated treatment well Patient left: in bed;with call bell/phone within reach (on stretcher in ED) Nurse Communication: Mobility status PT Visit Diagnosis: Unsteadiness on feet (R26.81);Muscle weakness (generalized) (M62.81);Difficulty in walking, not elsewhere classified (R26.2)    Time: 8546-2703 PT Time Calculation (min) (ACUTE ONLY): 14 min   Charges:   PT Evaluation $PT Eval Moderate Complexity: 1 Mod          Reuel Derby, PT, DPT  Acute Rehabilitation Services  Office: [phone]   Rudean Hitt 02/25/2022, 11:20 AM

## 2022-02-25 NOTE — Assessment & Plan Note (Signed)
Chronic.  BMI greater than 40.  Last known weight was 167.4 kg.  Height 6 feet 2 inches.

## 2022-02-25 NOTE — ED Notes (Signed)
Remains in MRI 

## 2022-02-25 NOTE — Evaluation (Signed)
Occupational Therapy Evaluation Patient Details Name: Lawrence [name] MRN: [id number] DOB: Jul 16, 1956 Today's Date: 02/25/2022   History of Present Illness 66 year old male presented to ED with slurred speech, left-sided weakness. CT/CTA-negative, MRI not yet performed.PHMx: morbid obesity, OSA, type 2 diabetes, history of prior stroke with residual left-sided hemiparesis, chronic back pain status post spinal stimulator, COPD, paroxysmal A-fib   Clinical Impression   This 66 yo male admitted with above presents to acute OT with PLOF of needing increased A some days more than others for basic ADLs. Currently he feels weaker than his baseline and his LUE/LLE is weaker than baseline per pt with heavy reliance on rollator with UEs for stand pivot from bed to chair. He will continue to benefit from acute OT with follow up [place].      Recommendations for follow up therapy are one component of a multi-disciplinary discharge planning process, led by the attending physician.  Recommendations may be updated based on patient status, additional functional criteria and insurance authorization.   Follow Up Recommendations  Home health OT    Assistance Recommended at Discharge Frequent or constant Supervision/Assistance  Patient can return home with the following A lot of help with bathing/dressing/bathroom;Assistance with cooking/housework;Assist for transportation;Help with stairs or ramp for entrance    Functional Status Assessment  Patient has had a recent decline in their functional status and demonstrates the ability to make significant improvements in function in a reasonable and predictable amount of time.  Equipment Recommendations  None recommended by OT       Precautions / Restrictions Precautions Precautions: Fall Restrictions Weight Bearing Restrictions: No      Mobility Bed Mobility Overal bed mobility: Needs Assistance Bed Mobility: Supine to Sit     Supine to sit: Min guard,  HOB elevated          Transfers Overall transfer level: Needs assistance Equipment used: Rollator (4 wheels) Transfers: Sit to/from Stand Sit to Stand: Min assist           General transfer comment: increased time and increased effort over baseline per pt      Balance Overall balance assessment: Needs assistance Sitting-balance support: No upper extremity supported, Feet supported Sitting balance-Leahy Scale: Good     Standing balance support: Bilateral upper extremity supported, Reliant on assistive device for balance Standing balance-Leahy Scale: Poor                             ADL either performed or assessed with clinical judgement   ADL Overall ADL's : Needs assistance/impaired Eating/Feeding: Independent;Sitting   Grooming: Set up;Sitting   Upper Body Bathing: Set up;Sitting   Lower Body Bathing: Moderate assistance   Upper Body Dressing : Set up;Sitting   Lower Body Dressing: Moderate assistance   Toilet Transfer: Minimal assistance;Stand-pivot;Rollator (4 wheels) Toilet Transfer Details (indicate cue type and reason): simulated stretcher to recliner Toileting- Clothing Manipulation and Hygiene: Maximal assistance Toileting - Clothing Manipulation Details (indicate cue type and reason): min A sit<>stand             Vision Patient Visual Report: No change from baseline              Pertinent Vitals/Pain Pain Assessment Pain Assessment: Faces Faces Pain Scale: Hurts even more Pain Location: back Pain Descriptors / Indicators: Aching, Sore Pain Intervention(s): Limited activity within patient's tolerance, Monitored during session, Repositioned     Hand Dominance Right   Extremity/Trunk Assessment Upper  Extremity Assessment Upper Extremity Assessment: Defer to OT evaluation LUE Deficits / Details: lag compared to RUE for shoulder flexion, groslly 3/5 throughout (pt reports this is below his baseline) LUE Coordination:  decreased fine motor;decreased gross motor      Communication Communication Communication: No difficulties   Cognition Arousal/Alertness: Awake/alert Behavior During Therapy: WFL for tasks assessed/performed Overall Cognitive Status: Within Functional Limits for tasks assessed                                                  Home Living Family/patient expects to be discharged to:: Private residence Living Arrangements: Spouse/significant other Available Help at Discharge: Family;Available 24 hours/day Type of Home: House Home Access: Level entry     Home Layout: One level     Bathroom Shower/Tub: Tub/shower unit;Sponge bathes at baseline   Bathroom Toilet: Standard Bathroom Accessibility: No   Home Equipment: Transport planner (4 wheels);Grab bars - tub/shower;Hospital bed   Additional Comments: Has a PCA 4 days a week a couple hours a day      Prior Functioning/Environment Prior Level of Function : Needs assist             Mobility Comments: Rollator for household mobility; electric scooter in community dwellings ADLs Comments: Reports some assist for ADLs from aide (bathing more than dressing). Able to complete toileting with min A.        OT Problem List: Decreased strength;Decreased range of motion;Impaired balance (sitting and/or standing);Obesity;Impaired UE functional use;Pain      OT Treatment/Interventions: Self-care/ADL training;DME and/or AE instruction;Patient/family education;Balance training    OT Goals(Current goals can be found in the care plan section) Acute Rehab OT Goals Patient Stated Goal: to get stronger and go home OT Goal Formulation: With patient/family Time For Goal Achievement: 03/11/22 Potential to Achieve Goals: Good  OT Frequency: Min 2X/week       AM-PAC OT "6 Clicks" Daily Activity     Outcome Measure Help from another person eating meals?: A Little Help from another person taking care  of personal grooming?: A Little Help from another person toileting, which includes using toliet, bedpan, or urinal?: A Lot Help from another person bathing (including washing, rinsing, drying)?: A Lot Help from another person to put on and taking off regular upper body clothing?: A Little Help from another person to put on and taking off regular lower body clothing?: A Lot 6 Click Score: 15   End of Session Equipment Utilized During Treatment: Gait belt;Rollator (4 wheels) Nurse Communication: Mobility status  Activity Tolerance: Patient limited by pain Patient left: in chair;with call bell/phone within reach;with family/visitor present  OT Visit Diagnosis: Unsteadiness on feet (R26.81);Other abnormalities of gait and mobility (R26.89);Muscle weakness (generalized) (M62.81);Pain Pain - part of body:  (back)                Time: 7564-3329 OT Time Calculation (min): 47 min Charges:  OT General Charges $OT Visit: 1 Visit OT Evaluation $OT Eval Moderate Complexity: 1 Mod OT Treatments $Self Care/Home Management : 23-37 mins  [place], OTR/L Acute Rehab Services [facility] [phone] Office [phone]   Almon Register 02/25/2022, 12:07 PM

## 2022-02-25 NOTE — Assessment & Plan Note (Signed)
Verified with pt and pt's wife. Verified DNR form.

## 2022-02-25 NOTE — Assessment & Plan Note (Signed)
Chronic.  Continue Lipitor 40 mg daily.

## 2022-02-25 NOTE — Plan of Care (Addendum)
Stroke Team progress Note   Patient being followed by neurology on consult for: headache, followed by worsening left sided weakness and left sided numbness.   Impression: Breakthrough seizures, with strokelike episode with left-sided weakness  ?reactivation of old stroke sxs versus less likely acute stroke   In brief Lawrence [name] is a 66 y.o. male with PMH significant for afibb on eliquis with last dose in PM, DM2 with neuropathy, CAD, prior stroke with mild residual left sided weakness(walks with a walker/cane), Htn, morbid obesity, COPD, OSA, suspected seizures on keppra '500mg'$  BID who [facility] ED with headache, followed by worsening left sided weakness and left sided numbness. He was seen early this am by Dr Lorrin Goodell and re-assessed by Dr Leonie Man and myself. Daughter described episode as shaking on right side and increased weakness noted on left side, worsened in LUE. He was alert and oriented on exam. No shaking on exam. Weakness to left side noted 4/5. It is of note , he has a spinal pain stimulator that he has the remote for in his room. They were able to turn off for MRI and this did attribute to patient's pain during exam.   Per patient and his daughter, he is compliant on CPAP use and Eliquis '5mg'$  BID (reordered home med after MRI negative for bleed). Considered changing from Eliquis to Pradaxa if stroke on MRI and MRI NAP, will continue Eliquis.  Assessment:  Breakthrough seizures, reactivation of old stroke sxs versus less likely acute stroke (MRI - no acute process):  CT Head without contrast(Personally reviewed): CTH was negative for a large hypodensity concerning for a large territory infarct or hyperdensity concerning for an ICH   CT angio Head and Neck with contrast(Personally reviewed): Poor contrast timing with venous contamination but no obvious LVO.   MRI Brain(Personally reviewed): No evidence of acute intracranial abnormality. Specifically, the axial  diffusion-weighted sequence is of good quality and there is no evidence of acute infarct.   Echo Complete:EF 50-55% with mild aortic dilatation   Recommendations: Continue home Keppra, Lipitor and Eliquis 2.   Added ASA '81mg'$  antiplatelet for further stroke prevention 3.   Added Vimpat '50mg'$  BID and increase by '50mg'$  every week until at desired dose '200mg'$  BID. For secondary seizure prevention .  Follow-up as an outpatient with Dr. April Manson for seizure management 4.   EEG to eval for breakthrough seizures as patient had shaking spells along with the left sided weakness. 5.  LDL, Hgb A1C pending     Neurology will continue to follow for EEG results. Please call us with any new neurological deficits or concerns     JESSICA Evalina Field, NP 02/25/22 3:26 PM  STROKE MD NOTE :  I have personally obtained history,examined this patient, reviewed notes, independently viewed imaging studies, participated in medical decision making and plan of care.ROS completed by me personally and pertinent positives fully documented  I have made any additions or clarifications directly to the above note. Agree with note above.  Patient with known history of partial seizures presented with recurrent partial seizures involving the right side but then subsequently also complained of some left-sided weakness.  Neurological exam shows some giveaway weakness on the left but not much objective signs of weakness.  MRI scan is limited but shows no definite acute stroke.  Has not been able to tolerate a higher dose of Keppra due to drowsiness and side effects and is continue Keppra 500 twice daily and add Vimpat 50 mg twice daily and  increase gradually as tolerated.  Mobilize out of bed.  Therapy consults.  No further stroke work-up is necessary.  Patient can follow-up with his neurologist Dr. April Manson as an outpatient for further seizure management.  Long discussion with patient and wife and answered questions.  Discussed with Dr. Pietro Cassis.   Greater than 50% time during this 50-minute visit was spent in counseling and coordination of care about his breakthrough seizures and strokelike episode and answering questions.  Antony Contras, MD Medical Director [facility] Pager: [phone] 02/25/2022 3:41 PM

## 2022-02-25 NOTE — H&P (Signed)
History and Physical    Lawrence [name]:[id number] DOB: 08-13-1955 DOA: 02/24/2022  DOS: the patient was seen and examined on 02/24/2022  PCP: [name], PA-C   Patient coming from: Home  I have personally briefly reviewed patient's old medical records in [facility]  Chief complaint: Slurred speech, left-sided weakness. History present illness: 66 year old Caucasian male history of morbid obesity, OSA, type 2 diabetes, history of prior stroke with residual left-sided hemiparesis, chronic back pain status post spinal stimulator, history of DNR, COPD, paroxysmal A-fib on Eliquis who presents to the ER with last known normal around 9 PM.  Patient had gone to the [facility] as usual on 02/24/2022.  On the evening of 02/24/2022 around 9:00, the wife noted the patient having some shaking of the right arm.  He had a left facial droop.  His left side including his arm and legs seem weaker than normal.  He was having dysarthria and slurred speech.  She called 911.  Patient brought to the ER.  Initial CT scan of the head along with CT angio were negative.  Neurology has seen the patient.  Patient has continued left-sided hemiparesis.  Neurology concern for small stroke.  MRI brain has been ordered.  Patient's dysarthria has improved but he still having residual left-sided weakness that is worse than normal for him according to wife.  [facility] contacted for admission.  Patient denies any recent chest pain, nausea, vomiting, diarrhea.   ED Course: CT head, CT angio negative for stroke  Review of Systems:  Review of Systems  Constitutional: Negative.   HENT: Negative.    Eyes: Negative.   Respiratory: Negative.    Cardiovascular: Negative.   Gastrointestinal: Negative.   Genitourinary: Negative.   Musculoskeletal:  Positive for back pain.       Chronic back pain  Skin: Negative.   Neurological:  Positive for speech change and weakness.  Endo/Heme/Allergies: Negative.    Psychiatric/Behavioral: Negative.    All other systems reviewed and are negative.   Past Medical History:  Diagnosis Date   Acquired dilation of ascending aorta and aortic root ([place])    60mm by echo 01/2021   Adenomatous colon polyp 2007   Anemia    Anxiety    Aortic stenosis    moderate AS by echo 01/2021   Asthma    BPH without obstruction/lower urinary tract symptoms 02/22/2017   Chronic diastolic (congestive) heart failure (HCC)    Chronic venous stasis 03/07/2019   COPD (chronic obstructive pulmonary disease) (HCC)    Coronary artery calcification seen on CAT scan    Depression    Diabetic neuropathy ([place]) 09/11/2019   History of colon polyps 08/24/2018   Hypertension    Morbid obesity (HCC)    OSA (obstructive sleep apnea)    Pain due to onychomycosis of toenails of both feet 09/11/2019   Peripheral neuropathy 02/22/2017   Primary osteoarthritis, left shoulder 03/05/2017   PTSD (post-traumatic stress disorder)    Pure hypercholesterolemia    QT prolongation 03/07/2019   Seizures (HCC)    Shortness of breath    Sinus tachycardia 03/07/2019   Sleep apnea    CPAP   Type 2 diabetes mellitus with vascular disease ([place]) 09/11/2019    Past Surgical History:  Procedure Laterality Date   ENDOVENOUS ABLATION SAPHENOUS VEIN W/ LASER Right 08/20/2020   endovenous laser ablation right greater saphenous vein by Gae Gallop MD    JOINT REPLACEMENT     left knee replacement x  2   KNEE ARTHROSCOPY Bilateral    LEFT HEART CATH AND CORONARY ANGIOGRAPHY N/A 01/17/2018   Procedure: LEFT HEART CATH AND CORONARY ANGIOGRAPHY;  Surgeon: Leonie Man, MD;  Location: [place] [facility];  Service: Cardiovascular;  Laterality: N/A;   PRESSURE SENSOR/CARDIOMEMS N/A 08/26/2021   Procedure: PRESSURE SENSOR/CARDIOMEMS;  Surgeon: Larey Dresser, MD;  Location: [place] [facility];  Service: Cardiovascular;  Laterality: N/A;   RIGHT HEART CATH N/A 08/26/2021   Procedure: RIGHT HEART CATH;   Surgeon: Larey Dresser, MD;  Location: [place] [facility];  Service: Cardiovascular;  Laterality: N/A;   TEE WITHOUT CARDIOVERSION N/A 08/26/2021   Procedure: TRANSESOPHAGEAL ECHOCARDIOGRAM (TEE);  Surgeon: Larey Dresser, MD;  Location: [facility] ENDOSCOPY;  Service: Cardiovascular;  Laterality: N/A;   UMBILICAL HERNIA REPAIR       reports that he quit smoking about 5 years ago. His smoking use included cigarettes. He has a 44.00 pack-year smoking history. He has been exposed to tobacco smoke. He has never used smokeless tobacco. He reports current alcohol use. He reports that he does not use drugs.  Allergies  Allergen Reactions   Vancomycin Other (See Comments)    "Red Man Syndrome" 02/02/17: possible cause for rash under both arms   Niacin Other (See Comments)   Niacin And Related Other (See Comments)    Red man syndrome   Tubersol [Tuberculin, Ppd] Other (See Comments)    Reaction unknown   Doxycycline Rash and Other (See Comments)    Family History  Problem Relation Age of Onset   CAD Maternal Grandfather    Diabetes Other    Diabetes Mellitus II Neg Hx    Colon cancer Neg Hx    Esophageal cancer Neg Hx    Inflammatory bowel disease Neg Hx    Liver disease Neg Hx    Pancreatic cancer Neg Hx    Rectal cancer Neg Hx    Stomach cancer Neg Hx    Sleep apnea Neg Hx     Prior to Admission medications   Medication Sig Start Date End Date Taking? Authorizing Provider  Accu-Chek FastClix Lancets MISC 1 each by Other route in the morning and at bedtime. 12/22/20   [provider]  acetaminophen (TYLENOL) 325 MG tablet Take 2 tablets (650 mg total) by mouth every 4 (four) hours as needed for mild pain (or temp > 37.5 C (99.5 F)). 01/26/21   Cherene Altes, MD  albuterol (VENTOLIN HFA) 108 (90 Base) MCG/ACT inhaler Inhale 2 puffs into the lungs every 6 (six) hours as needed for wheezing or shortness of breath. 02/02/22   Hunsucker, Bonna Gains, MD  Alcohol Swabs (ALCOHOL PREP)  70 % PADS 1 each by Other route in the morning, at noon, and at bedtime. 12/22/20   [provider]  allopurinol (ZYLOPRIM) 100 MG tablet Take 200 mg by mouth daily.    [provider]  ARIPiprazole (ABILIFY) 5 MG tablet TAKE ONE TABLET BY MOUTH EVERYDAY AT BEDTIME 09/09/21   Mozingo, Berdie Ogren, NP  atorvastatin (LIPITOR) 40 MG tablet Take 1 tablet (40 mg total) by mouth every evening. TAKE ONE TABLET BY MOUTH DAILY AT 6PM 06/24/21   Sueanne Margarita, MD  B-D ULTRAFINE III SHORT PEN 31G X 8 MM MISC 1 each by Other route See admin instructions. Use to inject insulin subcutaneously at bedtime as directed. 11/20/20   [provider]  blood glucose meter kit and supplies Dispense based on patient and  insurance preference. Use up to four times daily as directed. (FOR ICD-10 E10.9, E11.9). 11/09/17   Eugenie Filler, MD  bromocriptine (PARLODEL) 2.5 MG tablet Take 1 tablet (2.5 mg total) by mouth at bedtime. 10/14/21   Renato Shin, MD  busPIRone (BUSPAR) 5 MG tablet Take 1 tablet (5 mg total) by mouth 3 (three) times daily. 09/09/21   Mozingo, Berdie Ogren, NP  cetirizine (ZYRTEC) 10 MG tablet Take 10 mg by mouth at bedtime.     [provider]  Cholecalciferol (VITAMIN D3 PO) Take 2,000 Units by mouth daily.    [provider]  Continuous Blood Gluc Receiver (FREESTYLE LIBRE 2 READER) DEVI Change blood sugar daily 01/20/22   Elayne Snare, MD  Continuous Blood Gluc Sensor (FREESTYLE LIBRE 2 SENSOR) MISC Change every 14 days 01/20/22   Elayne Snare, MD  diltiazem (CARDIZEM CD) 180 MG 24 hr capsule TAKE ONE CAPSULE BY MOUTH ONCE DAILY 01/06/22   Sueanne Margarita, MD  ELIQUIS 5 MG TABS tablet TAKE ONE TABLET BY MOUTH TWICE DAILY 01/06/22   Larey Dresser, MD  FARXIGA 10 MG TABS tablet TAKE ONE TABLET BY MOUTH BEFORE BREAKFAST 10/01/21   Renato Shin, MD  ferrous sulfate 324 (65 Fe) MG TBEC Take 324 mg by mouth daily with breakfast.     [provider]   fluticasone (FLONASE) 50 MCG/ACT nasal spray Place 2 sprays into both nostrils daily.     [provider]  Fluticasone-Umeclidin-Vilant (TRELEGY ELLIPTA) 200-62.5-25 MCG/ACT AEPB Inhale 1 puff into the lungs daily. 10/18/21   Hunsucker, Bonna Gains, MD  icosapent Ethyl (VASCEPA) 1 g capsule Take 2 capsules (2 g total) by mouth 2 (two) times daily. 12/27/21   Larey Dresser, MD  insulin degludec (TRESIBA FLEXTOUCH) 100 UNIT/ML FlexTouch Pen Max daily dose 30 Units 12/24/21   Elayne Snare, MD  isosorbide mononitrate (IMDUR) 30 MG 24 hr tablet TAKE ONE TABLET BY MOUTH EVERY EVENING 10/01/21   Sueanne Margarita, MD  levETIRAcetam (KEPPRA) 500 MG tablet Take 1 tablet (500 mg total) by mouth 2 (two) times daily. 09/14/21   Alric Ran, MD  metFORMIN (GLUCOPHAGE-XR) 500 MG 24 hr tablet Take 4 tablets (2,000 mg total) by mouth daily. 06/16/21   Renato Shin, MD  metoprolol succinate (TOPROL-XL) 50 MG 24 hr tablet Take 1 tablet by mouth daily. Take with or immediately following a meal. 01/06/22   Turner, Eber Hong, MD  omeprazole (PRILOSEC) 20 MG capsule Take 20 mg by mouth every morning. 05/29/20   [provider]  Oxycodone HCl 10 MG TABS Take 10 mg by mouth as needed.    [provider]  potassium chloride SA (KLOR-CON M) 20 MEQ tablet Take 2 tablets (40 mEq total) by mouth 2 (two) times daily. Patient not taking: Reported on 12/24/2021 08/26/21   Larey Dresser, MD  prazosin (MINIPRESS) 2 MG capsule TAKE TWO CAPSULES BY MOUTH EVERYDAY AT BEDTIME 07/08/21   Sueanne Margarita, MD  pregabalin (LYRICA) 150 MG capsule Take 1 capsule (150 mg total) by mouth 3 (three) times daily. 01/07/22   Sater, [name], MD  repaglinide (PRANDIN) 2 MG tablet Take 2 tablets (4 mg total) by mouth 2 (two) times daily before a meal. 07/15/21   Renato Shin, MD  spironolactone (ALDACTONE) 25 MG tablet Take 1 tablet (25 mg total) by mouth daily. 06/24/21   Sueanne Margarita, MD  temazepam (RESTORIL) 15 MG capsule TAKE  ONE CAPSULE BY MOUTH EVERYDAY AT BEDTIME  01/12/22   Mozingo, Berdie Ogren, NP  torsemide (DEMADEX) 20 MG tablet Take 4 tablets (80 mg total) by mouth every morning AND 3 tablets (60 mg total) every evening. 08/26/21   Larey Dresser, MD  traZODone (DESYREL) 100 MG tablet Take 2 tablets (200 mg total) by mouth at bedtime. Take 200 mg by mouth at bedtime. 09/09/21   Mozingo, Berdie Ogren, NP  TRULICITY 4.5 OI/7.8MV SOPN INJECT 4.$RemoveBefor'5mg'KlXpmxcGHMvc$  into THE SKIN ONCE A WEEK 10/11/21   Renato Shin, MD  venlafaxine XR (EFFEXOR-XR) 75 MG 24 hr capsule Take 3 capsules (225 mg total) by mouth daily with breakfast. 09/09/21   Mozingo, Berdie Ogren, NP    Physical Exam: Vitals:   02/25/22 0026 02/25/22 0027 02/25/22 0028 02/25/22 0029  BP:      Pulse: 93 94 95 95  Resp: (!) $RemoveB'9 11 14 17  'HsHJFbpC$ Temp:      TempSrc:      SpO2: 92% 92% 90% 91%    Physical Exam Vitals and nursing note reviewed.  Constitutional:      General: He is not in acute distress.    Appearance: He is obese. He is not ill-appearing, toxic-appearing or diaphoretic.  HENT:     Head: Normocephalic and atraumatic.     Nose: Nose normal.  Cardiovascular:     Rate and Rhythm: Normal rate and regular rhythm.  Pulmonary:     Effort: Pulmonary effort is normal.  Abdominal:     General: Bowel sounds are normal. There is no distension.     Palpations: Abdomen is soft.     Tenderness: There is no abdominal tenderness.     Hernia: No hernia is present.  Musculoskeletal:     Right lower leg: Edema present.     Left lower leg: Edema present.  Skin:    General: Skin is warm and dry.     Capillary Refill: Capillary refill takes less than 2 seconds.  Neurological:     Mental Status: He is alert and oriented to person, place, and time.     Comments: Noted left hand grip approximately 4 out of 5 Right hand grip normal 5 out of 5      Labs on Admission: I have personally reviewed following labs and imaging studies  CBC: Recent Labs  Lab  02/24/22 0018 02/25/22 0019  WBC 11.6*  --   NEUTROABS 8.6*  --   HGB 13.9 13.9  HCT 42.9 41.0  MCV 89.4  --   PLT 244  --    Basic Metabolic Panel: Recent Labs  Lab 02/24/22 0018 02/25/22 0019  NA 136 138  K 3.5 3.5  CL 100 98  CO2 27  --   GLUCOSE 158* 152*  BUN 21 22  CREATININE 1.02 1.00  CALCIUM 8.8*  --    GFR: CrCl cannot be calculated (Unknown ideal weight.). Liver Function Tests: Recent Labs  Lab 02/24/22 0018  AST 15  ALT 21  ALKPHOS 66  BILITOT 0.5  PROT 6.4*  ALBUMIN 3.3*   No results for input(s): "LIPASE", "AMYLASE" in the last 168 hours. No results for input(s): "AMMONIA" in the last 168 hours. Coagulation Profile: Recent Labs  Lab 02/24/22 0018  INR 1.0   Cardiac Enzymes: No results for input(s): "CKTOTAL", "CKMB", "CKMBINDEX", "TROPONINI", "TROPONINIHS" in the last 168 hours. BNP (last 3 results) No results for input(s): "PROBNP" in the last 8760 hours. HbA1C: No results for input(s): "HGBA1C" in the last 72 hours. CBG: No results for  input(s): "GLUCAP" in the last 168 hours. Lipid Profile: No results for input(s): "CHOL", "HDL", "LDLCALC", "TRIG", "CHOLHDL", "LDLDIRECT" in the last 72 hours. Thyroid Function Tests: No results for input(s): "TSH", "T4TOTAL", "FREET4", "T3FREE", "THYROIDAB" in the last 72 hours. Anemia Panel: No results for input(s): "VITAMINB12", "FOLATE", "FERRITIN", "TIBC", "IRON", "RETICCTPCT" in the last 72 hours. Urine analysis:    Component Value Date/Time   COLORURINE YELLOW 08/07/2021 2100   APPEARANCEUR CLEAR 08/07/2021 2100   LABSPEC 1.020 08/07/2021 2100   PHURINE 6.0 08/07/2021 2100   GLUCOSEU >=500 (A) 08/07/2021 2100   HGBUR SMALL (A) 08/07/2021 2100   [place] NEGATIVE 08/07/2021 2100   [place] NEGATIVE 08/07/2021 2100   PROTEINUR NEGATIVE 08/07/2021 2100   NITRITE NEGATIVE 08/07/2021 2100   LEUKOCYTESUR NEGATIVE 08/07/2021 2100    Radiological Exams on Admission: I have personally  reviewed images CT ANGIO HEAD NECK W WO CM (CODE STROKE)  Result Date: 02/25/2022 CLINICAL DATA:  Code stroke EXAM: CT ANGIOGRAPHY HEAD AND NECK TECHNIQUE: Multidetector CT imaging of the head and neck was performed using the standard protocol during bolus administration of intravenous contrast. Multiplanar CT image reconstructions and MIPs were obtained to evaluate the vascular anatomy. Carotid stenosis measurements (when applicable) are obtained utilizing NASCET criteria, using the distal internal carotid diameter as the denominator. RADIATION DOSE REDUCTION: This exam was performed according to the departmental dose-optimization program which includes automated exposure control, adjustment of the mA and/or kV according to patient size and/or use of iterative reconstruction technique. CONTRAST:  42mL OMNIPAQUE IOHEXOL 350 MG/ML SOLN COMPARISON:  None Available. FINDINGS: CTA NECK FINDINGS SKELETON: There is no bony spinal canal stenosis. No lytic or blastic lesion. OTHER NECK: Normal pharynx, larynx and major salivary glands. No cervical lymphadenopathy. Unremarkable thyroid gland. UPPER CHEST: No pneumothorax or pleural effusion. No nodules or masses. AORTIC ARCH: There is calcific atherosclerosis of the aortic arch. There is no aneurysm, dissection or hemodynamically significant stenosis of the visualized portion of the aorta. Conventional 3 vessel aortic branching pattern. The visualized proximal subclavian arteries are widely patent. RIGHT CAROTID SYSTEM: Normal without aneurysm, dissection or stenosis. LEFT CAROTID SYSTEM: Normal without aneurysm, dissection or stenosis. VERTEBRAL ARTERIES: Left dominant configuration. Both origins are clearly patent. There is no dissection, occlusion or flow-limiting stenosis to the skull base (V1-V3 segments). CTA HEAD FINDINGS POSTERIOR CIRCULATION: --Vertebral arteries: Normal V4 segments. --Inferior cerebellar arteries: Normal. --Basilar artery: Normal. --Superior  cerebellar arteries: Normal. --Posterior cerebral arteries (PCA): Normal. ANTERIOR CIRCULATION: --Intracranial internal carotid arteries: Normal. --Anterior cerebral arteries (ACA): Normal. Both A1 segments are present. Patent anterior communicating artery (a-comm). --Middle cerebral arteries (MCA): Normal. VENOUS SINUSES: As permitted by contrast timing, patent. ANATOMIC VARIANTS: None Review of the MIP images confirms the above findings. IMPRESSION: 1. No emergent large vessel occlusion or high-grade stenosis of the intracranial arteries. 2. Aortic Atherosclerosis (ICD10-I70.0). Electronically Signed   By: Ulyses Jarred M.D.   On: 02/25/2022 00:36   CT HEAD CODE STROKE WO CONTRAST  Result Date: 02/25/2022 CLINICAL DATA:  Code stroke.  Acute neurologic deficit EXAM: CT HEAD WITHOUT CONTRAST TECHNIQUE: Contiguous axial images were obtained from the base of the skull through the vertex without intravenous contrast. RADIATION DOSE REDUCTION: This exam was performed according to the departmental dose-optimization program which includes automated exposure control, adjustment of the mA and/or kV according to patient size and/or use of iterative reconstruction technique. COMPARISON:  None Available. FINDINGS: Brain: There is no mass, hemorrhage or extra-axial collection. The size and configuration of the ventricles  and extra-axial CSF spaces are normal. There is hypoattenuation of the periventricular white matter, most commonly indicating chronic ischemic microangiopathy. Vascular: No abnormal hyperdensity of the major intracranial arteries or dural venous sinuses. No intracranial atherosclerosis. Skull: The visualized skull base, calvarium and extracranial soft tissues are normal. Sinuses/Orbits: No fluid levels or advanced mucosal thickening of the visualized paranasal sinuses. No mastoid or middle ear effusion. The orbits are normal. ASPECTS [facility] Stroke Program Early CT Score) - Ganglionic level infarction  (caudate, lentiform nuclei, internal capsule, insula, M1-M3 cortex): 7 - Supraganglionic infarction (M4-M6 cortex): 3 Total score (0-10 with 10 being normal): 10 IMPRESSION: 1. No acute intracranial abnormality. 2. ASPECTS is 10. These results were communicated to Dr. Donnetta Simpers at 12:04 am on 02/25/2022 by text page via the [facility] messaging system. Electronically Signed   By: Ulyses Jarred M.D.   On: 02/25/2022 00:05    EKG: My personal interpretation of EKG shows: no EKG performed  Assessment/Plan Principal Problem:   Ischemic stroke [facility]) Active Problems:   OSA (obstructive sleep apnea)   Morbid obesity (HCC)   Diabetes mellitus type 2 in obese (HCC)   Chronic diastolic CHF (congestive heart failure) (HCC)   Do not resuscitate status   AF (paroxysmal atrial fibrillation) (HCC)   COPD (chronic obstructive pulmonary disease) (HCC)   Hemiparesis affecting left side as late effect of cerebrovascular accident (CVA) ([place])   Durable power of attorney for healthcare exists    Pure hypercholesterolemia    Assessment and Plan: * Ischemic stroke ([place]) Observation telemetry bed. Check lipid panel, A1c. Check echo. MRI brain ordered. Pt has nerve stimulator. Unclear if it is MRI compatible. Wife has controller for stimulator. See neurology consult/recs. Will hold eliquis for now. Allow for permissive HTN. Keep BP < 220/110  Durable power of attorney for healthcare exists  Verified that the patient has a healthcare power of attorney.  Pictures taken of his documentation.         Hemiparesis affecting left side as late effect of cerebrovascular accident (CVA) ([place]) Chronic.  Wife states patient's had left-sided weakness since his last stroke in 2022.  He has to use a walker or cane  COPD (chronic obstructive pulmonary disease) (HCC) Chronic.  Stable.  Not exacerbated.  Continue Trelegy inhaler.  AF (paroxysmal atrial fibrillation) (HCC) Stable.  At home is on Eliquis.  Holding  Eliquis given recent stroke.  Continues Cardizem 180 mg daily.  Toprol-XL 50 mg daily.  Do not resuscitate status Verified with pt and pt's wife. Verified DNR form.      Chronic diastolic CHF (congestive heart failure) (HCC) Chronic.  Stable.  Appears euvolemic.  Continue Demadex 80 mg in the morning and 60 mg in the evening.  Diabetes mellitus type 2 in obese ([place]) Continue Farxiga, add Lantus since [place] [facility] is not formulary.  Hold Prandin any Glucophage.  Morbid obesity (HCC) Chronic.  BMI greater than 40.  Last known weight was 167.4 kg.  Height 6 feet 2 inches.  OSA (obstructive sleep apnea) Chronic. Continue with CPAP  Pure hypercholesterolemia Chronic.  Continue Lipitor 40 mg daily.   DVT prophylaxis: SQ Heparin Code Status: DNR/DNI(Do NOT Intubate) verified with patient.  Verified DNR form. Family Communication: Discussed with the patient and his significant other [name] at bedside. Disposition Plan: Return home. Consults called: EDP is consulted neurology. Admission status: Observation, Telemetry bed   Kristopher Oppenheim, DO [facility] 02/25/2022, 2:05 AM

## 2022-02-25 NOTE — Assessment & Plan Note (Signed)
Verified that the patient has a healthcare power of attorney.  Pictures taken of his documentation.

## 2022-02-25 NOTE — ED Notes (Signed)
Returned from MRI 

## 2022-02-25 NOTE — Assessment & Plan Note (Signed)
Chronic.  Wife states patient's had left-sided weakness since his last stroke in 2022.  He has to use a walker or cane

## 2022-02-25 NOTE — ED Notes (Signed)
Pt transported to MRI 

## 2022-02-25 NOTE — ED Triage Notes (Signed)
Pt via [facility] as activated code stroke; sudden onset R sided "worst HA ever." Pt w noted slurred speech, increased weakness, inability to bear weight on L leg, L sided facial droop, inability to perform ADLs. L sided deficits at baseline from hx CVA, on eliquis.   102/64 HR 98 CBG 147 LVO 4

## 2022-02-25 NOTE — Progress Notes (Signed)
  Echocardiogram 2D Echocardiogram has been performed.  Eartha Inch 02/25/2022, 11:44 AM

## 2022-02-25 NOTE — Progress Notes (Signed)
EEG complete - results pending 

## 2022-02-25 NOTE — Assessment & Plan Note (Signed)
Observation telemetry bed. Check lipid panel, A1c. Check echo. MRI brain ordered. Pt has nerve stimulator. Unclear if it is MRI compatible. Wife has controller for stimulator. See neurology consult/recs. Will hold eliquis for now. Allow for permissive HTN. Keep BP < 220/110

## 2022-02-25 NOTE — Consult Note (Signed)
NEUROLOGY CONSULTATION NOTE   Date of service: February 25, 2022 Patient Name: Lawrence [name] MRN:  [id number] DOB:  06-Mar-1956 Reason for consult: "worsening of baseline left sided weakness." Requesting Provider: Merrily Pew, MD _ _ _   _ __   _ __ _ _  __ __   _ __   __ _  History of Present Illness  Lawrence [name] is a 66 y.o. male with PMH significant for afibb on eliquis with last dose in PM, DM2 with neuropathy, CAD, prior stroke with mild residual left sided weakness(walks with a walker/cane), Htn, morbid obesity, COPD, OSA, suspected seizures on keppra '500mg'$  BID,  who presents with headache, followed by worsening left sided weakness and left sided numbness.  Reports he was doing fine this AM, even went to the local gym to get some exercise. Around 2200, he had sudden onset headache and noted that his LUE and LLE was weak. Unable to bear any weight so EMS called and brought in to the Hospital. He confirmed with me that he took his night dose of Eliquis.  LKW: 2200 on 02/24/22. mRS: 2 tNKASE: not offered, on eliquis Thrombectomy: not offered, no LVO NIHSS components Score: Comment  1a Level of Conscious 0'[x]'$  1'[]'$  2'[]'$  3'[]'$      1b LOC Questions 0'[x]'$  1'[]'$  2'[]'$       1c LOC Commands 0'[x]'$  1'[]'$  2'[]'$       2 Best Gaze 0'[x]'$  1'[]'$  2'[]'$       3 Visual 0'[x]'$  1'[]'$  2'[]'$  3'[]'$      4 Facial Palsy 0'[]'$  1'[x]'$  2'[]'$  3'[]'$      5a Motor Arm - left 0'[]'$  1'[x]'$  2'[]'$  3'[]'$  4'[]'$  UN'[]'$    5b Motor Arm - Right 0'[x]'$  1'[]'$  2'[]'$  3'[]'$  4'[]'$  UN'[]'$    6a Motor Leg - Left 0'[]'$  1'[]'$  2'[x]'$  3'[]'$  4'[]'$  UN'[]'$    6b Motor Leg - Right 0'[x]'$  1'[]'$  2'[]'$  3'[]'$  4'[]'$  UN'[]'$    7 Limb Ataxia 0'[x]'$  1'[]'$  2'[]'$  3'[]'$  UN'[]'$     8 Sensory 0'[]'$  1'[]'$  2'[x]'$  UN'[]'$      9 Best Language 0'[x]'$  1'[]'$  2'[]'$  3'[]'$      10 Dysarthria 0'[x]'$  1'[]'$  2'[]'$  UN'[]'$      11 Extinct. and Inattention 0'[x]'$  1'[]'$  2'[]'$       TOTAL: 6      ROS   Constitutional Denies weight loss, fever and chills.   HEENT Denies changes in vision and hearing.   Respiratory Denies SOB and cough.   CV Denies palpitations and CP   GI Denies abdominal  pain, nausea, vomiting and diarrhea.   GU Denies dysuria and urinary frequency.   MSK Denies myalgia and joint pain.   Skin Denies rash and pruritus.   Neurological + for mild headache but no syncope.   Psychiatric Denies recent changes in mood. Denies anxiety and depression.    Past History   Past Medical History:  Diagnosis Date   Acquired dilation of ascending aorta and aortic root ([place])    35m by echo 01/2021   Adenomatous colon polyp 2007   Anemia    Anxiety    Aortic stenosis    moderate AS by echo 01/2021   Asthma    BPH without obstruction/lower urinary tract symptoms 02/22/2017   Chronic diastolic (congestive) heart failure (HCC)    Chronic venous stasis 03/07/2019   COPD (chronic obstructive pulmonary disease) (HCC)    Coronary artery calcification seen on CAT scan    Depression    Diabetic neuropathy ([place] 09/11/2019   History of colon polyps 08/24/2018  Hypertension    Morbid obesity (HCC)    OSA (obstructive sleep apnea)    Pain due to onychomycosis of toenails of both feet 09/11/2019   Peripheral neuropathy 02/22/2017   Primary osteoarthritis, left shoulder 03/05/2017   PTSD (post-traumatic stress disorder)    Pure hypercholesterolemia    QT prolongation 03/07/2019   Seizures (HCC)    Shortness of breath    Sinus tachycardia 03/07/2019   Sleep apnea    CPAP   Type 2 diabetes mellitus with vascular disease ([place]) 09/11/2019   Past Surgical History:  Procedure Laterality Date   ENDOVENOUS ABLATION SAPHENOUS VEIN W/ LASER Right 08/20/2020   endovenous laser ablation right greater saphenous vein by Gae Gallop MD    JOINT REPLACEMENT     left knee replacement x 2   KNEE ARTHROSCOPY Bilateral    LEFT HEART CATH AND CORONARY ANGIOGRAPHY N/A 01/17/2018   Procedure: LEFT HEART CATH AND CORONARY ANGIOGRAPHY;  Surgeon: Leonie Man, MD;  Location: [facility];  Service: Cardiovascular;  Laterality: N/A;   PRESSURE SENSOR/CARDIOMEMS N/A 08/26/2021    Procedure: PRESSURE SENSOR/CARDIOMEMS;  Surgeon: Larey Dresser, MD;  Location: [place] [facility];  Service: Cardiovascular;  Laterality: N/A;   RIGHT HEART CATH N/A 08/26/2021   Procedure: RIGHT HEART CATH;  Surgeon: Larey Dresser, MD;  Location: [place] [facility];  Service: Cardiovascular;  Laterality: N/A;   TEE WITHOUT CARDIOVERSION N/A 08/26/2021   Procedure: TRANSESOPHAGEAL ECHOCARDIOGRAM (TEE);  Surgeon: Larey Dresser, MD;  Location: [facility];  Service: Cardiovascular;  Laterality: N/A;   UMBILICAL HERNIA REPAIR     Family History  Problem Relation Age of Onset   CAD Maternal Grandfather    Diabetes Other    Diabetes Mellitus II Neg Hx    Colon cancer Neg Hx    Esophageal cancer Neg Hx    Inflammatory bowel disease Neg Hx    Liver disease Neg Hx    Pancreatic cancer Neg Hx    Rectal cancer Neg Hx    Stomach cancer Neg Hx    Sleep apnea Neg Hx    Social History   Socioeconomic History   Marital status: Significant Other    Spouse name: [name]   Number of children: 1   Years of education: Not on file   Highest education level: Associate degree: academic program  Occupational History   Occupation: retired    Comment: Artist  Tobacco Use   Smoking status: Former    Packs/day: 1.00    Years: 44.00    Total pack years: 44.00    Types: Cigarettes    Quit date: 03/07/2016    Years since quitting: 5.9    Passive exposure: Past   Smokeless tobacco: Never  Vaping Use   Vaping Use: Never used  Substance and Sexual Activity   Alcohol use: Yes    Comment: rare   Drug use: No   Sexual activity: Not on file  Other Topics Concern   Not on file  Social History Narrative   Lives with wife [name]   Right Handed   Drinks 1-2 cups caffeine   Social Determinants of Health   Financial Resource Strain: Medium Risk (06/08/2021)   Overall Financial Resource Strain (CARDIA)    Difficulty of Paying Living Expenses: Somewhat hard  Food Insecurity: Food  Insecurity Present (06/08/2021)   Hunger Vital Sign    Worried About Running Out of Food in the Last Year: Sometimes true  Ran Out of Food in the Last Year: Never true  Transportation Needs: Unmet Transportation Needs (06/08/2021)   PRAPARE - Hydrologist (Medical): Yes    Lack of Transportation (Non-Medical): No  Physical Activity: Not on file  Stress: Not on file  Social Connections: Not on file   Allergies  Allergen Reactions   Vancomycin Other (See Comments)    "Red Man Syndrome" 02/02/17: possible cause for rash under both arms   Niacin Other (See Comments)   Niacin And Related Other (See Comments)    Red man syndrome   Tubersol [Tuberculin, Ppd] Other (See Comments)    Reaction unknown   Doxycycline Rash and Other (See Comments)    Medications  (Not in a hospital admission)    Vitals   There were no vitals filed for this visit.   There is no height or weight on file to calculate BMI.  Physical Exam   General: Laying comfortably in bed; in no acute distress.  HENT: Normal oropharynx and mucosa. Normal external appearance of ears and nose.  Neck: Supple, no pain or tenderness  CV: No JVD. BL lower ext edema. Pulmonary: Symmetric Chest rise. Normal respiratory effort.  Abdomen: Soft to touch, non-tender.  Ext: No cyanosis, BL leg discoloration and edema suspicous of venous stasis. No deformity  Skin: No rash. Normal palpation of skin.   Musculoskeletal: Normal digits and nails by inspection. No clubbing.   Neurologic Examination  Mental status/Cognition: Alert, oriented to self, place, month and year, good attention.  Speech/language: Fluent, comprehension intact, object naming intact, repetition intact.  Cranial nerves:   CN II Pupils equal and reactive to light, no VF deficits    CN III,IV,VI EOM intact, no gaze preference or deviation, no nystagmus    CN V normal sensation in V1, V2, and V3 segments bilaterally    CN VII Mild L  facial droop   CN VIII normal hearing to speech    CN IX & X normal palatal elevation, no uvular deviation    CN XI 5/5 head turn and 5/5 shoulder shrug bilaterally    CN XII midline tongue protrusion    Motor:  Muscle bulk: normal, tone normal. Mvmt Root Nerve  Muscle Right Left Comments  SA C5/6 Ax Deltoid 5 4   EF C5/6 Mc Biceps 5 4   EE C6/7/8 Rad Triceps 5 4   WF C6/7 Med FCR     WE C7/8 PIN ECU     F Ab C8/T1 U ADM/FDI 5 4   HF L1/2/3 Fem Illopsoas 5 3   KE L2/3/4 Fem Quad 5 2   DF L4/5 D Peron Tib Ant 5 1   PF S1/2 Tibial Grc/Sol 5 1     Sensation:  Light touch Decreased in LUE and LLE to touch compared to RUE and RLE   Pin prick    Temperature    Vibration   Proprioception    Coordination/Complex Motor:  - Finger to Nose intact BL - Heel to shin unable to do - Rapid alternating movement are slowed in LUE - Gait: Deferred for patient safety.  Labs   CBC:  Recent Labs  Lab 02/24/22 0018  WBC 11.6*  NEUTROABS 8.6*  HGB 13.9  HCT 42.9  MCV 89.4  PLT 789    Basic Metabolic Panel:  Lab Results  Component Value Date   NA 137 12/23/2021   K 4.1 12/23/2021   CO2 28 12/23/2021   GLUCOSE  247 (H) 12/23/2021   BUN 19 12/23/2021   CREATININE 1.02 12/23/2021   CALCIUM 9.0 12/23/2021   GFRNONAA >60 12/23/2021   GFRAA 77 05/20/2020   Lipid Panel:  Lab Results  Component Value Date   LDLCALC 62 12/23/2021   HgbA1c:  Lab Results  Component Value Date   HGBA1C 8.0 (A) 10/14/2021   Urine Drug Screen:     Component Value Date/Time   LABOPIA POSITIVE (A) 04/27/2021 0304   COCAINSCRNUR NONE DETECTED 04/27/2021 0304   LABBENZ POSITIVE (A) 04/27/2021 0304   AMPHETMU NONE DETECTED 04/27/2021 0304   THCU NONE DETECTED 04/27/2021 0304   LABBARB NONE DETECTED 04/27/2021 0304    Alcohol Level     Component Value Date/Time   ETH <10 04/27/2021 0018    CT Head without contrast(Personally reviewed): CTH was negative for a large hypodensity concerning for  a large territory infarct or hyperdensity concerning for an ICH  CT angio Head and Neck with contrast(Personally reviewed): Poor contrast timing with venous contamination but no obvious LVO.  MRI Brain(Personally reviewed): pending  Impression   RODRECUS BELSKY is a 66 y.o. male with PMH significant for afibb on eliquis with last dose in PM, DM2 with neuropathy, CAD, prior stroke with mild residual left sided weakness(walks with a walker/cane), Htn, morbid obesity, COPD, OSA, suspected seizures on keppra '500mg'$  BID,  who presents with headache, followed by worsening left sided weakness and left sided numbness. Symptoms are persistent, no jerking concerning for a seizure. Suspect that he probably had a small vessel stroke.  Primary Diagnosis:  Other cerebral infarction due to occlusion of stenosis of small artery.  Secondary Diagnosis: Essential (primary) hypertension, Heart failure, unspecified, Paroxysmal atrial fibrillation, Type 2 diabetes mellitus with hyperglycemia , and Morbid Obesity(BMI > 40)  Recommendations   - Frequent Neuro checks per stroke unit protocol - Recommend brain imaging with MRI Brain without contrast - Recommend obtaining TTE - Recommend obtaining Lipid panel with LDL - Please start statin if LDL > 70 - Recommend HbA1c - Hold hom Eliquis, Aspirin '81mg'$  daily for now. Timing to resume Eliquis based on the size of stroke on MRI Brain. - Recommend DVT ppx - SBP goal - permissive hypertension first 24 h < 220/110. Held home meds.  - Recommend Telemetry monitoring for arrythmia - Recommend bedside swallow screen prior to PO intake. - Stroke education booklet - Recommend PT/OT/SLP consult - Recommend Urine Tox screen.  ______________________________________________________________________   Thank you for the opportunity to take part in the care of this patient. If you have any further questions, please contact the neurology consultation  attending.  Signed,  [place] Pager Number [phone] _ _ _   _ __   _ __ _ _  __ __   _ __   __ _

## 2022-02-25 NOTE — Code Documentation (Signed)
Stroke Response Nurse Documentation Code Documentation  ARPAN ESKELSON is a 66 y.o. male arriving to [facility]  via [facility] on 7/20 with past medical hx of afib, CHF, HTN, seizures, OSA, COPD and morbid obesity. On Eliquis (apixaban) daily. Code stroke was activated by EMS.   Patient from home where he was LKW at 2200 and now complaining of worsening left sided weakness and left facial droop.   Stroke team at the bedside on patient arrival. Labs drawn and patient cleared for CT by Dr. Dayna Barker. Patient to CT with team. NIHSS 6, see documentation for details and code stroke times. Patient with left facial droop, left arm weakness, bilateral leg weakness, and left decreased sensation on exam. The following imaging was completed:  CT Head and CTA. Patient is not a candidate for IV Thrombolytic due to Eliquis. Patient is not a candidate for IR due to No LVO.    Bedside handoff with ED RN Jinny Blossom.    Madelynn Done  Rapid Response RN

## 2022-02-25 NOTE — Assessment & Plan Note (Signed)
Chronic. Continue with CPAP

## 2022-02-25 NOTE — ED Notes (Signed)
Admitting MD paged regarding pt's O2 sats/ potential need for CPAP

## 2022-02-25 NOTE — Assessment & Plan Note (Signed)
Chronic.  Stable.  Appears euvolemic.  Continue Demadex 80 mg in the morning and 60 mg in the evening.

## 2022-02-25 NOTE — Assessment & Plan Note (Signed)
Continue Wilder Glade, add Lantus since Tyler Aas is not formulary.  Hold Prandin any Glucophage.

## 2022-02-25 NOTE — ED Notes (Signed)
Pt offered Tylenol suppository for pain due to NPO status, pt declined.

## 2022-02-25 NOTE — Assessment & Plan Note (Signed)
Stable.  At home is on Eliquis.  Holding Eliquis given recent stroke.  Continues Cardizem 180 mg daily.  Toprol-XL 50 mg daily.

## 2022-02-25 NOTE — Progress Notes (Signed)
PROGRESS NOTE  Trellis Moment  DOB: 03/14/1956  PCP: [name], PA-C TDV:[id number]  DOA: 02/24/2022  LOS: 0 days  Hospital Day: 2  Brief narrative: Lawrence [name] is a 66 y.o. male with PMH significant for morbid obesity, OSA on CPAP, DM2, HTN, HLD, paroxysmal A-fib on Eliquis, prior stroke with residual left-sided hemiparesis, chronic diastolic CHF, moderate AS, suspected seizure on Keppra twice daily asthma/COPD, anxiety/depression, chronic anemia, peripheral neuropathy, PTSD, osteoarthritis, BPH, who lives at home, and at baseline walks with a walker/cane. Patient presented to the ED on 7/20 with complaint of headache, followed by worsening left-sided weakness and left-sided numbness.   7/20, around 9 PM, patient's wife noted some shaking of the right arm, left facial droop, more than usual weakness.  Also having dysarthria, slurred speech. Brought to the ED by EMS. In the ED, afebrile, heart rate in 90s, blood pressure in normal range, breathing on room air Seen by neurology as a stroke alert. CT head negative for acute infarct or bleeding CT angio head and neck negative for LVO MRI brain was a suboptimal study because of motion degradation but I did not see any acute intracranial malady. Admitted to hospitalist service  Subjective: Patient was seen and examined this morning.  Pleasant elderly morbidly obese male.  Propped up in bed.  On low-flow oxygen.  Was drowsy from the effect of Ativan he got for MRI. Chart reviewed Remains hemodynamically stable but with a deep in blood pressure to 90s overnight. Blood sugar 148 this morning.  Assessment and plan: Suspected stroke History of stroke with residual left-sided hemiparesis -Elderly male with significant risk factors and history of stroke presented with new acute neurological complaints as above -Negative CT head, CT angio head and neck -Neurology consult appreciated.  Stroke initiated.  -Pending echo, A1c, lipid  panel -Pending PT/OT/ST eval.  Uses a walker/cane at baseline. -Eliquis was kept on hold on admission.  Discussed with neurology Dr. Leonie Man.  Okay to resume Eliquis. Patient has a nerve stimulator and his wife has controller for stimulator.   History of seizure -Continue Keppra 500 mg twice daily.  Noted a plan from neurology to add Vimpat.  Paroxysmal A-fib -PTA on Cardizem 180 mg daily, Toprol 50 mg daily. -Blood pressure in low normal range today.  Resume Cardizem short-acting 30 mg every 6 hours.  Continue to monitor heart rate and blood pressure. -Continue Eliquis for anticoagulation.  Chronic diastolic CHF Essential hypertension -PTA on Cardizem, Toprol, torsemide 80 mg a.m. 60 mg p.m., Aldactone 25 mg daily, Imdur, prazosin 4 mg at bedtime, -Blood pressure in low normal range this morning.  I would keep him on hold for now to allow permissive hypertension for possible stroke.  Type 2 diabetes mellitus -A1c 8 on 0/01/2693 -PTA on Trulicity 4.5 mg subcu once a week, Tresiba 30 units daily, metformin 2000 mg daily,, Farxiga, repaglinide 4 mg twice daily, metformin -Currently ordered for Semglee 10 units at bedtime and sliding scale insulin.  Continue to monitor Recent Labs  Lab 02/25/22 0811 02/25/22 1308  GLUCAP 148* 105*   HLD -Continue Lipitor, Vascepa  COPD (chronic obstructive pulmonary disease) -Stable respiratory status  -continue Trelegy inhaler.  Morbid obesity  -There is no height or weight on file to calculate BMI. Patient has been advised to make an attempt to improve diet and exercise patterns to aid in weight loss.   OSA (obstructive sleep apnea) Chronic. Continue with CPAP  Gout -Continue allopurinol daily  ??Dementia/cognitive dysfunction Anxiety/depression -PTA  on Abilify, bromocriptine, BuSpar, Lyrica, Effexor to 25 mg daily, trazodone 200 mg at bedtime, Restoril nightly    Code Status: DNR    Mobility: PT eval pending  Skin assessment:      Nutritional status:  There is no height or weight on file to calculate BMI.          Diet:  Diet Order             Diet heart healthy/carb modified Room service appropriate? Yes; Fluid consistency: Thin  Diet effective now                   DVT prophylaxis:  SCD's Start: 02/25/22 0301 apixaban (ELIQUIS) tablet 5 mg   Antimicrobials: None Fluid: None Consultants: Neurology Family Communication: None at bedside  Status is: Observation  Continue in-hospital care because: Ongoing stroke work-up Level of care: Telemetry Medical   Dispo: The patient is from: Home              Anticipated d/c is to: Pending evaluation with therapy              Patient currently is not medically stable to d/c.   Difficult to place patient No     Infusions:    Scheduled Meds:  [START ON 02/26/2022]  stroke: early stages of recovery book   Does not apply Once   apixaban  5 mg Oral BID   aspirin EC  81 mg Oral Daily   atorvastatin  40 mg Oral QPM   dapagliflozin propanediol  10 mg Oral Daily   diltiazem  30 mg Oral Q6H   fluticasone furoate-vilanterol  1 puff Inhalation Daily   And   umeclidinium bromide  1 puff Inhalation Daily   icosapent Ethyl  2 g Oral BID   insulin aspart  0-15 Units Subcutaneous TID WC   insulin aspart  0-5 Units Subcutaneous QHS   insulin glargine-yfgn  10 Units Subcutaneous QHS   [START ON 02/26/2022] isosorbide mononitrate  30 mg Oral QPM   lacosamide  50 mg Oral BID   levETIRAcetam  500 mg Oral BID   [START ON 02/26/2022] metoprolol succinate  50 mg Oral Daily   pantoprazole  40 mg Oral Daily   prazosin  4 mg Oral QHS   pregabalin  150 mg Oral TID   [START ON 02/26/2022] spironolactone  25 mg Oral Daily   [START ON 02/26/2022] torsemide  80 mg Oral q AM   And   torsemide  60 mg Oral QPM    PRN meds: acetaminophen **OR** acetaminophen (TYLENOL) oral liquid 160 mg/5 mL **OR** acetaminophen, ondansetron (ZOFRAN) IV    Antimicrobials: Anti-infectives (From admission, onward)    None       Objective: Vitals:   02/25/22 1142 02/25/22 1500  BP: 113/77 (!) 102/55  Pulse: (!) 101 99  Resp: 18 15  Temp: 98.3 F (36.8 C)   SpO2: 95% 92%    Intake/Output Summary (Last 24 hours) at 02/25/2022 1605 Last data filed at 02/25/2022 0945 Gross per 24 hour  Intake --  Output 600 ml  Net -600 ml   There were no vitals filed for this visit. Weight change:  There is no height or weight on file to calculate BMI.   Physical Exam: General exam: Pleasant, elderly morbidly obese male.  Not in distress Skin: No rashes, lesions or ulcers. HEENT: Atraumatic, normocephalic, no obvious bleeding Lungs: Clear to auscultation bilaterally CVS: Slight tachycardia, A-fib rhythm GI/Abd soft, nontender,  nondistended, bowel sound present CNS: Drowsy from the effect of Ativan given earlier prior to my hydration.  Baseline weakness on the left side from previous stroke Psychiatry: Mood appropriate Extremities: Bilateral lower extreme lymphedema trace, no calf tenderness, stasis changes.  Data Review: I have personally reviewed the laboratory data and studies available.  F/u labs ordered Unresulted Labs (From admission, onward)     Start     Ordered   02/25/22 0301  HIV Antibody (routine testing w rflx)  (HIV Antibody (Routine testing w reflex) panel)  Add-on,   AD        02/25/22 0300   02/25/22 0209  Hemoglobin A1c  Add-on,   AD        02/25/22 0208   02/25/22 0209  Lipid panel  Add-on,   AD        02/25/22 0208   02/24/22 2344  Ethanol  Once,   URGENT        02/24/22 2344            Signed, Terrilee Croak, MD [facility] 02/25/2022

## 2022-02-26 DIAGNOSIS — I5032 Chronic diastolic (congestive) heart failure: Secondary | ICD-10-CM | POA: Diagnosis not present

## 2022-02-26 DIAGNOSIS — E1169 Type 2 diabetes mellitus with other specified complication: Secondary | ICD-10-CM | POA: Diagnosis not present

## 2022-02-26 DIAGNOSIS — R569 Unspecified convulsions: Secondary | ICD-10-CM | POA: Diagnosis not present

## 2022-02-26 DIAGNOSIS — Z794 Long term (current) use of insulin: Secondary | ICD-10-CM

## 2022-02-26 DIAGNOSIS — I480 Paroxysmal atrial fibrillation: Secondary | ICD-10-CM | POA: Diagnosis not present

## 2022-02-26 DIAGNOSIS — E1142 Type 2 diabetes mellitus with diabetic polyneuropathy: Secondary | ICD-10-CM

## 2022-02-26 DIAGNOSIS — G4733 Obstructive sleep apnea (adult) (pediatric): Secondary | ICD-10-CM | POA: Diagnosis not present

## 2022-02-26 LAB — GLUCOSE, CAPILLARY
Glucose-Capillary: 169 mg/dL — ABNORMAL HIGH (ref 70–99)
Glucose-Capillary: 139 mg/dL — ABNORMAL HIGH (ref 70–99)
Glucose-Capillary: 140 mg/dL — ABNORMAL HIGH (ref 70–99)

## 2022-02-26 MED ORDER — ASPIRIN 81 MG PO TBEC: 81.0000 mg | DELAYED_RELEASE_TABLET | Freq: Every day | ORAL | 12 refills | Status: DC

## 2022-02-26 MED ORDER — LACOSAMIDE 50 MG PO TABS: 50.0000 mg | ORAL_TABLET | Freq: Two times a day (BID) | ORAL | 0 refills | Status: DC

## 2022-02-26 NOTE — Progress Notes (Signed)
   02/26/22 1200  Clinical Encounter Type  Visited With Patient  Visit Type Spiritual support;Social support  Referral From Nurse  Consult/Referral To Chaplain   Pt requested a visit with the chaplain for prayer.  Pt was in good spirits and sought prayer for both himself and his girlfriend, Lawrence [name].  He mentioned that he had experienced a mild stroke and asked for prayer to aid in his recovery.  Chaplain provide prayer and offered supportive care during the visit.  Hubert Azure, Chaplain  Pager: [phone]

## 2022-02-26 NOTE — TOC Transition Note (Addendum)
Transition of Care [facility]) - CM/SW Discharge Note   Patient Details  Name: Lawrence [name] MRN: [id number] Date of Birth: 02-23-1956  Transition of Care [facility] CM/SW Contact:  Carles Collet, RN Phone Number: 02/26/2022, 11:34 AM   Clinical Narrative:    Damaris Schooner w patient at bedside. He is agreeable to [facility] services, referral made to [place] per patient preference. No DME needs per patient. Requests PTAR due to not being able to lift himself out of a car. PTAR forms on chart, will call when DC order placed.  Verified online that generic Vimpat is covered by Medicaid. [email or web address]  PTAR called for pick up     Final next level of care: [place] Barriers to Discharge: No Barriers Identified   Patient Goals and CMS Choice Patient states their goals for this hospitalization and ongoing recovery are:: to go home CMS Medicare.gov Compare Post Acute Care list provided to:: Patient Choice offered to / list presented to : Patient  Discharge Placement                       Discharge Plan and Services                DME Arranged: N/A         HH Arranged: PT, OT HH Agency: [place] Date [place]: 02/26/22 Time [place]: 1134 Representative spoke with at [place]: [place] ([place]) Interventions     Readmission Risk Interventions    08/12/2021   10:14 AM 08/12/2021   10:12 AM 02/11/2021    1:34 PM  Readmission Risk Prevention Plan  Transportation Screening Complete Complete Complete  Medication Review Press photographer) Complete Complete Complete  PCP or Specialist appointment within 3-5 days of discharge Complete    HRI or [place] Complete  Complete  SW Recovery Care/Counseling Consult Complete  Complete  Palliative Care Screening Not Applicable  Not [place] Not Applicable  Not Applicable

## 2022-02-26 NOTE — Procedures (Signed)
Patient Name: Lawrence [name]  MRN: [id number]  Epilepsy Attending: Lora Havens  Referring Physician/Provider: Clarice Pole, NP  Date: 02/24/2022 Duration: 22.22 mins  Patient history: 66yo M with shaking spells along with the left sided weakness. EEG to evaluate for seizure  Level of alertness: Awake, asleep  AEDs during EEG study: LEV, LCM, TPM, PGB  Technical aspects: This EEG study was done with scalp electrodes positioned according to the 10-20 International system of electrode placement. Electrical activity was acquired at a sampling rate of '500Hz'$  and reviewed with a high frequency filter of '70Hz'$  and a low frequency filter of '1Hz'$ . EEG data were recorded continuously and digitally stored.   Description: The posterior dominant rhythm consists of 8-9 Hz activity of moderate voltage (25-35 uV) seen predominantly in posterior head regions, symmetric and reactive to eye opening and eye closing. Sleep was characterized by vertex waves, sleep spindles (12 to 14 Hz), maximal frontocentral region.   Hyperventilation and photic stimulation were not performed.     IMPRESSION: This study is within normal limits. No seizures or epileptiform discharges were seen throughout the recording.  [name] Barbra Sarks

## 2022-02-26 NOTE — Discharge Summary (Signed)
Physician Discharge Summary  JC VERON KGM:[id number] DOB: Sep 06, 1955 DOA: 02/24/2022  PCP: [name], PA-C  Admit date: 02/24/2022 Discharge date: 02/26/2022  Admitted From: Home Discharge disposition: Home with home health PT  Recommendations Continue home Keppra, Lipitor and Eliquis 2.   Added ASA 81 mg antiplatelet for further stroke prevention 3.   Added Vimpat '50mg'$  BID and increase by '50mg'$  every week until at desired dose '200mg'$  BID. For secondary seizure prevention .  Follow-up as an outpatient with Dr. April Manson for seizure management  Brief narrative: Lawrence [name] is a 66 y.o. male with PMH significant for morbid obesity, OSA on CPAP, DM2, HTN, HLD, paroxysmal A-fib on Eliquis, prior stroke with residual left-sided hemiparesis, chronic diastolic CHF, moderate AS, suspected seizure on Keppra twice daily asthma/COPD, anxiety/depression, chronic anemia, peripheral neuropathy, PTSD, osteoarthritis, BPH, who lives at home, and at baseline walks with a walker/cane. Patient presented to the ED on 7/20 with complaint of headache, followed by worsening left-sided weakness and left-sided numbness.   7/20, around 9 PM, patient's wife noted some shaking of the right arm, left facial droop, more than usual weakness.  Also having dysarthria, slurred speech. Brought to the ED by EMS. In the ED, afebrile, heart rate in 90s, blood pressure in normal range, breathing on room air Seen by neurology as a stroke alert. CT head negative for acute infarct or bleeding CT angio head and neck negative for LVO MRI brain was a suboptimal study because of motion degradation but I did not see any acute intracranial malady. Admitted to hospitalist service  Subjective: Patient was seen and examined this morning.   Sitting up in chair.  Not in distress.  Not on supplemental oxygen.  Alert, awake, oriented x3.  Able to walk with PT without getting dizzy.  Blood pressure running on the low 100s.  He  states that he has baseline blood pressure at home as well.  Feels ready to go home today.  Assessment and plan: Breakthrough seizure versus strokelike episode History of stroke with residual left-sided hemiparesis -Elderly male with significant risk factors and history of stroke presented with new acute neurological complaints as above including some shaking of the right arm, left facial droop, more than usual weakness of the left side. -Negative CT head, CT angio head and neck, MRI brain -Neurology consult appreciated.  Per neurology, patient's symptoms were more likely likely related to a breakthrough seizure.   -For CVA prophylaxis, patient to continue Eliquis.  Aspirin 81 mg daily has been added as well. -Seen by PT.  Home with PT recommended.  Breakthrough seizure  history of seizure -Continue Keppra 500 mg twice daily.  Vimpat was added by neurology.  Paroxysmal A-fib -PTA on Cardizem 180 mg daily, Toprol 50 mg daily and Eliquis -Continue home.  Chronic diastolic CHF Essential hypertension -PTA on Cardizem, Toprol, torsemide 80 mg a.m. 60 mg p.m., Aldactone 25 mg daily, Imdur, prazosin 4 mg at bedtime, -Per patient, at baseline, his heart rate is in 90s and blood pressure in low 100s.  Currently in baseline range.   -Continue same medicines  Type 2 diabetes mellitus -A1c 8 on 01/09/4033 -PTA on Trulicity 4.5 mg subcu once a week, Tresiba 30 units daily, metformin 2000 mg daily,, Farxiga, repaglinide 4 mg twice daily, metformin -Resume the same at home. Recent Labs  Lab 02/25/22 1308 02/25/22 1748 02/25/22 2126 02/26/22 0600 02/26/22 1120  GLUCAP 105* 150* 176* 169* 139*   HLD -Continue Lipitor, Vascepa  COPD (  chronic obstructive pulmonary disease) -Stable respiratory status  -continue Trelegy inhaler.  Morbid obesity  -Body mass index is 47.98 kg/m. Patient has been advised to make an attempt to improve diet and exercise patterns to aid in weight loss.   OSA  (obstructive sleep apnea) Chronic. Continue with CPAP  Gout -Continue allopurinol daily  ??Dementia/cognitive dysfunction Anxiety/depression -PTA on Abilify, bromocriptine, BuSpar, Lyrica, Effexor to 25 mg daily, trazodone 200 mg at bedtime, Restoril nightly    Code Status: DNR    Mobility: PT eval obtained  Skin assessment:     Nutritional status:  Body mass index is 47.98 kg/m.          Wounds:  - Wound / Incision (Open or Dehisced) 12/05/19 Leg Right;Left (Active)  Date First Assessed/Time First Assessed: 12/05/19 2200   Location: Leg  Location Orientation: Right;Left  Present on Admission: Yes    Assessments 12/06/2019  4:16 AM 12/07/2019 10:00 AM  Dressing Type Abdominal pads;Gauze (Comment) Abdominal pads;Gauze (Comment)  Dressing Changed Changed --  Dressing Status Clean;Dry;Intact Clean;Dry;Intact  Dressing Change Frequency -- Twice a day  Drainage Amount Scant None  Drainage Description Sanguineous --  Treatment Cleansed --     No associated orders.    Discharge Exam:   Vitals:   02/25/22 2313 02/26/22 0433 02/26/22 0809 02/26/22 1146  BP: 125/60 123/70 (!) 109/51 109/71  Pulse: 94 92 92 92  Resp: '20 20 20 18  '$ Temp: 97.6 F (36.4 C) 98.1 F (36.7 C) 98.6 F (37 C)   TempSrc: Oral Oral Oral   SpO2: 92% 92% 92% 92%  Weight:      Height:        Body mass index is 47.98 kg/m.  General exam: Pleasant, elderly morbidly obese male.  Not in distress Skin: No rashes, lesions or ulcers. HEENT: Atraumatic, normocephalic, no obvious bleeding Lungs: Clear to auscultation bilaterally CVS: Regular rate and rhythm, no murmur GI/Abd soft, nontender, nondistended, bowel sound present CNS: Alert, awake, oriented x3.  Baseline weakness in the left from previous stroke. Psychiatry: Mood appropriate Extremities: Bilateral lower extreme lymphedema trace, no calf tenderness, stasis changes.  Follow ups:    Follow-up Information     [facility] [place] [facility]  Follow up.   Specialty: Home Health Services Why: for home health services. they will call you in 1-2 days to set up your first home appointment Contact information: [place] [address] [phone]         [name], PA-C Follow up.   Specialty: Family Medicine Contact information: [place] [address] [phone]         Constance Haw, MD .   Specialty: Cardiology Contact information: [address] [place] [address] [phone]         Sueanne Margarita, MD .   Specialty: Cardiology Contact information: [address] [place] [address] [phone]         Larey Dresser, MD .   Specialty: Cardiology Contact information: [phone] [address] [place] [address] [phone]                 Discharge Instructions:   Discharge Instructions     Call MD for:  difficulty breathing, headache or visual disturbances   Complete by: As directed    Call MD for:  extreme fatigue   Complete by: As directed    Call MD for:  hives  Complete by: As directed    Call MD for:  persistant dizziness or light-headedness   Complete by: As directed    Call MD for:  persistant nausea and vomiting   Complete by: As directed    Call MD for:  severe uncontrolled pain   Complete by: As directed    Call MD for:  temperature >100.4   Complete by: As directed    Diet - low sodium heart healthy   Complete by: As directed    Diet Carb Modified   Complete by: As directed    Discharge instructions   Complete by: As directed    Recommendations 1.   Continue home Keppra, Lipitor and Eliquis 2.   Added ASA 81 mg antiplatelet for further stroke prevention 3.   Added Vimpat '50mg'$  BID and increase by '50mg'$  every week until at desired dose '200mg'$  BID. For secondary seizure prevention .  Follow-up as an outpatient with Dr. April Manson for seizure management  Discharge  instructions for diabetes mellitus: Check blood sugar 3 times a day and bedtime at home. If blood sugar running above 200 or less than 70 please call your MD to adjust insulin. If you notice signs and symptoms of hypoglycemia (low blood sugar) like jitteriness, confusion, thirst, tremor and sweating, please check blood sugar, drink sugary drink/biscuits/sweets to increase sugar level and call MD or return to ER.    Discharge instructions for CHF Check weight daily -preferably same time every day. Restrict fluid intake to 1200 ml daily Restrict salt intake to less than 2 g daily. Call MD if you have one of the following symptoms 1) 3 pound weight gain in 24 hours or 5 pounds in 1 week  2) swelling in the hands, feet or stomach  3) progressive shortness of breath 4) if you have to sleep on extra pillows at night in order to breathe     Seizure precautions: -Per [facility] statutes, patients with seizures are not allowed to drive until they have been seizure-free for six months.  -Use caution when using heavy equipment or power tools.  -Avoid working on ladders or at heights.  -Take showers instead of baths. Ensure the water temperature is not too high on the home water heater.  -Do not go swimming alone.  -Do not lock yourself in a room alone (i.e. bathroom). -When caring for infants or small children, sit down when holding, feeding, or changing them to minimize risk of injury to the child in the event you have a seizure.  -Maintain good sleep hygiene. -Avoid alcohol.    If patient has another seizure, call 911 and bring them back to the ED if: A.  The seizure lasts longer than 5 minutes.      B.  The patient doesn't wake shortly after the seizure or has new problems such as difficulty seeing, speaking or moving following the seizure C.  The patient was injured during the seizure D.  The patient has a temperature over 102 F (39C) E.  The patient vomited during the seizure  and now is having trouble breathing    General discharge instructions: Follow with Primary MD [name], PA-C in 7 days  Please request your PCP  to go over your hospital tests, procedures, radiology results at the follow up. Please get your medicines reviewed and adjusted.  Your PCP may decide to repeat certain labs or tests as needed. Do not drive, operate heavy machinery, perform activities at heights, swimming or participation  in water activities or provide baby sitting services if your were admitted for syncope or siezures until you have seen by Primary MD or a Neurologist and advised to do so again. [place] Controlled Substance Reporting System database was reviewed. Do not drive, operate heavy machinery, perform activities at heights, swim, participate in water activities or provide baby-sitting services while on medications for pain, sleep and mood until your outpatient physician has reevaluated you and advised to do so again.  You are strongly recommended to comply with the dose, frequency and duration of prescribed medications. Activity: As tolerated with Full fall precautions use walker/cane & assistance as needed Avoid using any recreational substances like cigarette, tobacco, alcohol, or non-prescribed drug. If you experience worsening of your admission symptoms, develop shortness of breath, life threatening emergency, suicidal or homicidal thoughts you must seek medical attention immediately by calling 911 or calling your MD immediately  if symptoms less severe. You must read complete instructions/literature along with all the possible adverse reactions/side effects for all the medicines you take and that have been prescribed to you. Take any new medicine only after you have completely understood and accepted all the possible adverse reactions/side effects.  Wear Seat belts while driving. You were cared for by a hospitalist during your hospital stay. If you have any  questions about your discharge medications or the care you received while you were in the hospital after you are discharged, you can call the unit and ask to speak with the hospitalist or the covering physician. Once you are discharged, your primary care physician will handle any further medical issues. Please note that NO REFILLS for any discharge medications will be authorized once you are discharged, as it is imperative that you return to your primary care physician (or establish a relationship with a primary care physician if you do not have one).   Increase activity slowly   Complete by: As directed        Discharge Medications:   Allergies as of 02/26/2022       Reactions   Vancomycin Other (See Comments)   "Red Man Syndrome" 02/02/17: possible cause for rash under both arms   Niacin Other (See Comments)   Niacin And Related Other (See Comments)   Red man syndrome   Tubersol [tuberculin, Ppd] Other (See Comments)   Reaction unknown   Doxycycline Rash, Other (See Comments)        Medication List     TAKE these medications    Accu-Chek FastClix Lancets Misc 1 each by Other route in the morning and at bedtime.   acetaminophen 325 MG tablet Commonly known as: TYLENOL Take 2 tablets (650 mg total) by mouth every 4 (four) hours as needed for mild pain (or temp > 37.5 C (99.5 F)).   albuterol 108 (90 Base) MCG/ACT inhaler Commonly known as: VENTOLIN HFA Inhale 2 puffs into the lungs every 6 (six) hours as needed for wheezing or shortness of breath.   Alcohol Prep 70 % Pads 1 each by Other route in the morning, at noon, and at bedtime.   allopurinol 100 MG tablet Commonly known as: ZYLOPRIM Take 200 mg by mouth daily.   ARIPiprazole 5 MG tablet Commonly known as: ABILIFY TAKE ONE TABLET BY MOUTH EVERYDAY AT BEDTIME What changed:  how much to take how to take this when to take this additional instructions   aspirin EC 81 MG tablet Take 1 tablet (81 mg total) by  mouth daily. Swallow whole. Start taking  on: February 27, 2022   atorvastatin 40 MG tablet Commonly known as: LIPITOR Take 1 tablet (40 mg total) by mouth every evening. TAKE ONE TABLET BY MOUTH DAILY AT 6PM   bromocriptine 2.5 MG tablet Commonly known as: PARLODEL Take 1 tablet (2.5 mg total) by mouth at bedtime.   busPIRone 5 MG tablet Commonly known as: BUSPAR Take 1 tablet (5 mg total) by mouth 3 (three) times daily.   cetirizine 10 MG tablet Commonly known as: ZYRTEC Take 10 mg by mouth at bedtime.   diltiazem 180 MG 24 hr capsule Commonly known as: CARDIZEM CD TAKE ONE CAPSULE BY MOUTH ONCE DAILY   Eliquis 5 MG Tabs tablet Generic drug: apixaban TAKE ONE TABLET BY MOUTH TWICE DAILY What changed: how much to take   Farxiga 10 MG Tabs tablet Generic drug: dapagliflozin propanediol TAKE ONE TABLET BY MOUTH BEFORE BREAKFAST What changed: See the new instructions.   ferrous sulfate 324 (65 Fe) MG Tbec Take 324 mg by mouth daily with breakfast.   fluticasone 50 MCG/ACT nasal spray Commonly known as: FLONASE Place 2 sprays into both nostrils daily.   icosapent Ethyl 1 g capsule Commonly known as: Vascepa Take 2 capsules (2 g total) by mouth 2 (two) times daily.   isosorbide mononitrate 30 MG 24 hr tablet Commonly known as: IMDUR TAKE ONE TABLET BY MOUTH EVERY EVENING What changed: when to take this   lacosamide 50 MG Tabs tablet Commonly known as: VIMPAT Take 1 tablet (50 mg total) by mouth 2 (two) times daily. Start with '50mg'$  BID and increase by '50mg'$  every week until at desired dose '200mg'$  BID.   levETIRAcetam 500 MG tablet Commonly known as: KEPPRA Take 1 tablet (500 mg total) by mouth 2 (two) times daily.   metFORMIN 500 MG 24 hr tablet Commonly known as: GLUCOPHAGE-XR Take 4 tablets (2,000 mg total) by mouth daily. What changed:  how much to take when to take this   metoprolol succinate 50 MG 24 hr tablet Commonly known as: TOPROL-XL Take 1 tablet by  mouth daily. Take with or immediately following a meal. What changed: additional instructions   omeprazole 20 MG capsule Commonly known as: PRILOSEC Take 20 mg by mouth every morning.   potassium chloride SA 20 MEQ tablet Commonly known as: KLOR-CON M Take 2 tablets (40 mEq total) by mouth 2 (two) times daily.   prazosin 2 MG capsule Commonly known as: MINIPRESS TAKE TWO CAPSULES BY MOUTH EVERYDAY AT BEDTIME What changed: See the new instructions.   pregabalin 150 MG capsule Commonly known as: LYRICA Take 1 capsule (150 mg total) by mouth 3 (three) times daily.   repaglinide 2 MG tablet Commonly known as: PRANDIN Take 2 tablets (4 mg total) by mouth 2 (two) times daily before a meal.   spironolactone 25 MG tablet Commonly known as: ALDACTONE Take 1 tablet (25 mg total) by mouth daily.   temazepam 15 MG capsule Commonly known as: RESTORIL TAKE ONE CAPSULE BY MOUTH EVERYDAY AT BEDTIME What changed: See the new instructions.   torsemide 20 MG tablet Commonly known as: Demadex Take 4 tablets (80 mg total) by mouth every morning AND 3 tablets (60 mg total) every evening.   traZODone 100 MG tablet Commonly known as: DESYREL Take 2 tablets (200 mg total) by mouth at bedtime. Take 200 mg by mouth at bedtime.   Trelegy Ellipta 200-62.5-25 MCG/ACT Aepb Generic drug: Fluticasone-Umeclidin-Vilant Inhale 1 puff into the lungs daily.   Tyler Aas FlexTouch 100 UNIT/ML  FlexTouch Pen Generic drug: insulin degludec Max daily dose 30 Units What changed:  how much to take how to take this when to take this additional instructions   Trulicity 4.5 WN/4.6EV Sopn Generic drug: Dulaglutide INJECT 4.'5mg'$  into THE SKIN ONCE A WEEK What changed: See the new instructions.   venlafaxine XR 75 MG 24 hr capsule Commonly known as: EFFEXOR-XR Take 3 capsules (225 mg total) by mouth daily with breakfast.   VITAMIN D3 PO Take 2,000 Units by mouth daily.         The results of  significant diagnostics from this hospitalization (including imaging, microbiology, ancillary and laboratory) are listed below for reference.    Procedures and Diagnostic Studies:   ECHOCARDIOGRAM COMPLETE  Result Date: 02/25/2022    ECHOCARDIOGRAM REPORT   Patient Name:   Lawrence [name] Date of Exam: 02/25/2022 Medical Rec #:  [id number]        Height:       74.0 in Accession #:    [id number]       Weight:       369.0 lb Date of Birth:  08-13-1955        BSA:          2.820 m Patient Age:    66 years         BP:           113/77 mmHg Patient Gender: M                HR:           94 bpm. Exam Location:  Inpatient Procedure: 2D Echo, Color Doppler and Cardiac Doppler Indications:     Stroke I63.9  History:         Patient has prior history of Echocardiogram examinations, most                  recent 08/10/2021. CHF, CAD, CardioMEMS, COPD, Aortic Valve                  Disease, Arrythmias:Tachycardia; Risk Factors:Diabetes, Sleep                  Apnea, Obesity and Former Smoker.  Sonographer:     Eartha Inch Referring Phys:  [id number] ERIC CHEN Diagnosing Phys: Jenkins Rouge MD  Sonographer Comments: Patient is morbidly obese and suboptimal parasternal window. Image acquisition challenging due to patient body habitus. IMPRESSIONS  1. Left ventricular ejection fraction, by estimation, is 60 to 65%. The left ventricle has normal function. The left ventricle has no regional wall motion abnormalities. Left ventricular diastolic parameters are indeterminate.  2. Right ventricular systolic function is normal. The right ventricular size is normal.  3. The mitral valve is abnormal. Trivial mitral valve regurgitation. Moderate mitral annular calcification.  4. No mean gradient recorded peak velocity around 3 m/sec suggesting moderate AS with peak gradient 36 mmHg Tech went back to do more dopper. Peak velocity 3.2 m/sec mean gradient 29 peak 41 mmHg moderate AS . The aortic valve is tricuspid. Aortic valve  regurgitation  is not visualized. Moderate aortic valve stenosis.  5. Aortic dilatation noted. There is mild dilatation of the ascending aorta, measuring 39 mm. FINDINGS  Left Ventricle: Left ventricular ejection fraction, by estimation, is 60 to 65%. The left ventricle has normal function. The left ventricle has no regional wall motion abnormalities. The left ventricular internal cavity size was normal in size. There is  no left ventricular hypertrophy. Left ventricular diastolic  parameters are indeterminate. Right Ventricle: The right ventricular size is normal. Right vetricular wall thickness was not assessed. Right ventricular systolic function is normal. Left Atrium: Left atrial size was normal in size. Right Atrium: Right atrial size was normal in size. Pericardium: Trivial pericardial effusion is present. The pericardial effusion is anterior to the right ventricle. Mitral Valve: The mitral valve is abnormal. There is mild thickening of the mitral valve leaflet(s). There is mild calcification of the mitral valve leaflet(s). Moderate mitral annular calcification. Trivial mitral valve regurgitation. Tricuspid Valve: The tricuspid valve is normal in structure. Tricuspid valve regurgitation is mild. Aortic Valve: No mean gradient recorded peak velocity around 3 m/sec suggesting moderate AS with peak gradient 36 mmHg Tech went back to do more dopper. Peak velocity 3.2 m/sec mean gradient 29 peak 41 mmHg moderate AS. The aortic valve is tricuspid. Aortic valve regurgitation is not visualized. Moderate aortic stenosis is present. Aortic valve mean gradient measures 20.0 mmHg. Aortic valve peak gradient measures 35.9 mmHg. Pulmonic Valve: The pulmonic valve was not well visualized. Pulmonic valve regurgitation is not visualized. Aorta: Aortic dilatation noted. There is mild dilatation of the ascending aorta, measuring 39 mm. IAS/Shunts: The interatrial septum was not well visualized.  LEFT VENTRICLE PLAX 2D LVOT diam:     2.73 cm  LVOT Area:     5.86 cm  AORTIC VALVE AV Vmax:      299.60 cm/s AV Vmean:     208.000 cm/s AV VTI:       0.556 m AV Peak Grad: 35.9 mmHg AV Mean Grad: 20.0 mmHg  SHUNTS Systemic Diam: 2.73 cm Jenkins Rouge MD Electronically signed by Jenkins Rouge MD Signature Date/Time: 02/25/2022/12:02:32 PM    Final (Updated)    MR BRAIN WO CONTRAST  Result Date: 02/25/2022 CLINICAL DATA:  Provided history: Neuro deficit, acute, stroke suspected. EXAM: MRI HEAD WITHOUT CONTRAST TECHNIQUE: Multiplanar, multiecho pulse sequences of the brain and surrounding structures were obtained without intravenous contrast. COMPARISON:  CT angiogram head/neck 02/25/2022. Non-contrast head CT 02/24/2022. Brain MRI 04/27/2021. FINDINGS: Intermittently motion degraded examination, limiting evaluation and most notably as follows. Severe motion degradation of the sagittal T1 weighted sequence. Moderate motion degradation of the axial T2 sequence. Moderate motion degradation of the axial T2 FLAIR sequence. Severe motion degradation of the axial T1 weighted sequence. Moderate motion degradation of the axial SWI sequence. Moderate to severe motion degradation of the coronal T2 sequence. Brain: No age advanced or lobar predominant parenchymal atrophy. The axial diffusion-weighted imaging is of good quality. No evidence of acute infarct. Within the limitations of motion degradation, no cortical encephalomalacia is identified. No significant cerebral white matter disease for age. No appreciable intracranial mass or extra-axial fluid collection. No midline shift. Vascular: Maintained flow voids within the proximal large arterial vessels. Skull and upper cervical spine: Within described limitations, no focal suspicious marrow lesion is identified. Sinuses/Orbits: No mass or acute finding within the imaged orbits. Mild mucosal thickening within the inferior right maxillary sinus, and within the bilateral ethmoid air cells. IMPRESSION: Significantly motion  degraded examination, as described and limiting evaluation. No evidence of acute intracranial abnormality. Specifically, the axial diffusion-weighted sequence is of good quality and there is no evidence of acute infarct. Electronically Signed   By: Kellie Simmering D.O.   On: 02/25/2022 12:59   CT ANGIO HEAD NECK W WO CM (CODE STROKE)  Result Date: 02/25/2022 CLINICAL DATA:  Code stroke EXAM: CT ANGIOGRAPHY HEAD AND NECK TECHNIQUE: Multidetector CT imaging of the head  and neck was performed using the standard protocol during bolus administration of intravenous contrast. Multiplanar CT image reconstructions and MIPs were obtained to evaluate the vascular anatomy. Carotid stenosis measurements (when applicable) are obtained utilizing NASCET criteria, using the distal internal carotid diameter as the denominator. RADIATION DOSE REDUCTION: This exam was performed according to the departmental dose-optimization program which includes automated exposure control, adjustment of the mA and/or kV according to patient size and/or use of iterative reconstruction technique. CONTRAST:  74m OMNIPAQUE IOHEXOL 350 MG/ML SOLN COMPARISON:  None Available. FINDINGS: CTA NECK FINDINGS SKELETON: There is no bony spinal canal stenosis. No lytic or blastic lesion. OTHER NECK: Normal pharynx, larynx and major salivary glands. No cervical lymphadenopathy. Unremarkable thyroid gland. UPPER CHEST: No pneumothorax or pleural effusion. No nodules or masses. AORTIC ARCH: There is calcific atherosclerosis of the aortic arch. There is no aneurysm, dissection or hemodynamically significant stenosis of the visualized portion of the aorta. Conventional 3 vessel aortic branching pattern. The visualized proximal subclavian arteries are widely patent. RIGHT CAROTID SYSTEM: Normal without aneurysm, dissection or stenosis. LEFT CAROTID SYSTEM: Normal without aneurysm, dissection or stenosis. VERTEBRAL ARTERIES: Left dominant configuration. Both origins  are clearly patent. There is no dissection, occlusion or flow-limiting stenosis to the skull base (V1-V3 segments). CTA HEAD FINDINGS POSTERIOR CIRCULATION: --Vertebral arteries: Normal V4 segments. --Inferior cerebellar arteries: Normal. --Basilar artery: Normal. --Superior cerebellar arteries: Normal. --Posterior cerebral arteries (PCA): Normal. ANTERIOR CIRCULATION: --Intracranial internal carotid arteries: Normal. --Anterior cerebral arteries (ACA): Normal. Both A1 segments are present. Patent anterior communicating artery (a-comm). --Middle cerebral arteries (MCA): Normal. VENOUS SINUSES: As permitted by contrast timing, patent. ANATOMIC VARIANTS: None Review of the MIP images confirms the above findings. IMPRESSION: 1. No emergent large vessel occlusion or high-grade stenosis of the intracranial arteries. 2. Aortic Atherosclerosis (ICD10-I70.0). Electronically Signed   By: KUlyses JarredM.D.   On: 02/25/2022 00:36   CT HEAD CODE STROKE WO CONTRAST  Result Date: 02/25/2022 CLINICAL DATA:  Code stroke.  Acute neurologic deficit EXAM: CT HEAD WITHOUT CONTRAST TECHNIQUE: Contiguous axial images were obtained from the base of the skull through the vertex without intravenous contrast. RADIATION DOSE REDUCTION: This exam was performed according to the departmental dose-optimization program which includes automated exposure control, adjustment of the mA and/or kV according to patient size and/or use of iterative reconstruction technique. COMPARISON:  None Available. FINDINGS: Brain: There is no mass, hemorrhage or extra-axial collection. The size and configuration of the ventricles and extra-axial CSF spaces are normal. There is hypoattenuation of the periventricular white matter, most commonly indicating chronic ischemic microangiopathy. Vascular: No abnormal hyperdensity of the major intracranial arteries or dural venous sinuses. No intracranial atherosclerosis. Skull: The visualized skull base, calvarium and  extracranial soft tissues are normal. Sinuses/Orbits: No fluid levels or advanced mucosal thickening of the visualized paranasal sinuses. No mastoid or middle ear effusion. The orbits are normal. ASPECTS ([facility]roke Program Early CT Score) - Ganglionic level infarction (caudate, lentiform nuclei, internal capsule, insula, M1-M3 cortex): 7 - Supraganglionic infarction (M4-M6 cortex): 3 Total score (0-10 with 10 being normal): 10 IMPRESSION: 1. No acute intracranial abnormality. 2. ASPECTS is 10. These results were communicated to Dr. SDonnetta Simpersat 12:04 am on 02/25/2022 by text page via the [facility]messaging system. Electronically Signed   By: KUlyses JarredM.D.   On: 02/25/2022 00:05     Labs:   Basic Metabolic Panel: Recent Labs  Lab 02/24/22 0018 02/25/22 0019  NA 136 138  K 3.5 3.5  CL 100 98  CO2 27  --   GLUCOSE 158* 152*  BUN 21 22  CREATININE 1.02 1.00  CALCIUM 8.8*  --    GFR Estimated Creatinine Clearance: 123.6 mL/min (by C-G formula based on SCr of 1 mg/dL). Liver Function Tests: Recent Labs  Lab 02/24/22 0018  AST 15  ALT 21  ALKPHOS 66  BILITOT 0.5  PROT 6.4*  ALBUMIN 3.3*   No results for input(s): "LIPASE", "AMYLASE" in the last 168 hours. No results for input(s): "AMMONIA" in the last 168 hours. Coagulation profile Recent Labs  Lab 02/24/22 0018  INR 1.0    CBC: Recent Labs  Lab 02/24/22 0018 02/25/22 0019  WBC 11.6*  --   NEUTROABS 8.6*  --   HGB 13.9 13.9  HCT 42.9 41.0  MCV 89.4  --   PLT 244  --    Cardiac Enzymes: No results for input(s): "CKTOTAL", "CKMB", "CKMBINDEX", "TROPONINI" in the last 168 hours. BNP: Invalid input(s): "POCBNP" CBG: Recent Labs  Lab 02/25/22 1308 02/25/22 1748 02/25/22 2126 02/26/22 0600 02/26/22 1120  GLUCAP 105* 150* 176* 169* 139*   D-Dimer No results for input(s): "DDIMER" in the last 72 hours. Hgb A1c No results for input(s): "HGBA1C" in the last 72 hours. Lipid Profile No results for  input(s): "CHOL", "HDL", "LDLCALC", "TRIG", "CHOLHDL", "LDLDIRECT" in the last 72 hours. Thyroid function studies No results for input(s): "TSH", "T4TOTAL", "T3FREE", "THYROIDAB" in the last 72 hours.  Invalid input(s): "FREET3" Anemia work up No results for input(s): "VITAMINB12", "FOLATE", "FERRITIN", "TIBC", "IRON", "RETICCTPCT" in the last 72 hours. Microbiology Recent Results (from the past 240 hour(s))  Resp Panel by RT-PCR (Flu A&B, Covid) Anterior Nasal Swab     Status: None   Collection Time: 02/25/22  1:19 AM   Specimen: Anterior Nasal Swab  Result Value Ref Range Status   SARS Coronavirus 2 by RT PCR NEGATIVE NEGATIVE Final    Comment: (NOTE) SARS-CoV-2 target nucleic acids are NOT DETECTED.  The SARS-CoV-2 RNA is generally detectable in upper respiratory specimens during the acute phase of infection. The lowest concentration of SARS-CoV-2 viral copies this assay can detect is 138 copies/mL. A negative result does not preclude SARS-Cov-2 infection and should not be used as the sole basis for treatment or other patient management decisions. A negative result may occur with  improper specimen collection/handling, submission of specimen other than nasopharyngeal swab, presence of viral mutation(s) within the areas targeted by this assay, and inadequate number of viral copies(<138 copies/mL). A negative result must be combined with clinical observations, patient history, and epidemiological information. The expected result is Negative.  Fact Sheet for Patients:  EntrepreneurPulse.com.au  Fact Sheet for Healthcare Providers:  IncredibleEmployment.be  This test is no t yet approved or cleared by the [place] FDA and  has been authorized for detection and/or diagnosis of SARS-CoV-2 by FDA under an Emergency Use Authorization (EUA). This EUA will remain  in effect (meaning this test can be used) for the duration of the COVID-19  declaration under Section 564(b)(1) of the Act, 21 U.S.C.section 360bbb-3(b)(1), unless the authorization is terminated  or revoked sooner.       Influenza A by PCR NEGATIVE NEGATIVE Final   Influenza B by PCR NEGATIVE NEGATIVE Final    Comment: (NOTE) The Xpert Xpress SARS-CoV-2/FLU/RSV plus assay is intended as an aid in the diagnosis of influenza from Nasopharyngeal swab specimens and should not be used as a sole basis for treatment. Nasal washings and aspirates  are unacceptable for Xpert Xpress SARS-CoV-2/FLU/RSV testing.  Fact Sheet for Patients: EntrepreneurPulse.com.au  Fact Sheet for Healthcare Providers: IncredibleEmployment.be  This test is not yet approved or cleared by the [place] FDA and has been authorized for detection and/or diagnosis of SARS-CoV-2 by FDA under an Emergency Use Authorization (EUA). This EUA will remain in effect (meaning this test can be used) for the duration of the COVID-19 declaration under Section 564(b)(1) of the Act, 21 U.S.C. section 360bbb-3(b)(1), unless the authorization is terminated or revoked.  Performed at [facility], [place] [address] [place], [place] [address]     Time coordinating discharge: 35 minutes  Signed: Zoe Creasman  [facility] 02/26/2022, 2:22 PM

## 2022-02-26 NOTE — Progress Notes (Signed)
STROKE TEAM PROGRESS NOTE   SUBJECTIVE (INTERVAL HISTORY) No family is at the bedside.  Overall his condition is stable.  Patient sitting in chair, no complaints.  Currently on Keppra and Vimpat.  EEG normal.  BP soft but improving.  Denies any headache, dizziness or shaking/jerking.   OBJECTIVE Temp:  [97.4 F (36.3 C)-98.6 F (37 C)] 98.6 F (37 C) (07/22 0809) Pulse Rate:  [92-100] 92 (07/22 1146) Cardiac Rhythm: Normal sinus rhythm (07/22 0816) Resp:  [15-25] 18 (07/22 1146) BP: (89-125)/(51-85) 109/71 (07/22 1146) SpO2:  [92 %-94 %] 92 % (07/22 1146) Weight:  [169.5 kg] 169.5 kg (07/21 2026)  Recent Labs  Lab 02/25/22 1308 02/25/22 1748 02/25/22 2126 02/26/22 0600 02/26/22 1120  GLUCAP 105* 150* 176* 169* 139*   Recent Labs  Lab 02/24/22 0018 02/25/22 0019  NA 136 138  K 3.5 3.5  CL 100 98  CO2 27  --   GLUCOSE 158* 152*  BUN 21 22  CREATININE 1.02 1.00  CALCIUM 8.8*  --    Recent Labs  Lab 02/24/22 0018  AST 15  ALT 21  ALKPHOS 66  BILITOT 0.5  PROT 6.4*  ALBUMIN 3.3*   Recent Labs  Lab 02/24/22 0018 02/25/22 0019  WBC 11.6*  --   NEUTROABS 8.6*  --   HGB 13.9 13.9  HCT 42.9 41.0  MCV 89.4  --   PLT 244  --    No results for input(s): "CKTOTAL", "CKMB", "CKMBINDEX", "TROPONINI" in the last 168 hours. Recent Labs    02/24/22 0018  LABPROT 13.4  INR 1.0   Recent Labs    02/25/22 0950  COLORURINE YELLOW  LABSPEC 1.025  PHURINE 5.0  GLUCOSEU >=500*  HGBUR NEGATIVE  BILIRUBINUR NEGATIVE  KETONESUR NEGATIVE  PROTEINUR NEGATIVE  NITRITE NEGATIVE  LEUKOCYTESUR NEGATIVE       Component Value Date/Time   CHOL 163 12/23/2021 0934   TRIG 330 (H) 12/23/2021 0934   HDL 35 (L) 12/23/2021 0934   CHOLHDL 4.7 12/23/2021 0934   VLDL 66 (H) 12/23/2021 0934   LDLCALC 62 12/23/2021 0934   Lab Results  Component Value Date   HGBA1C 8.0 (A) 10/14/2021      Component Value Date/Time   LABOPIA NONE DETECTED 02/25/2022 0950   COCAINSCRNUR  NONE DETECTED 02/25/2022 0950   LABBENZ POSITIVE (A) 02/25/2022 0950   AMPHETMU NONE DETECTED 02/25/2022 0950   THCU NONE DETECTED 02/25/2022 0950   LABBARB NONE DETECTED 02/25/2022 0950    No results for input(s): "ETH" in the last 168 hours.  I have personally reviewed the radiological images below and agree with the radiology interpretations.  EEG adult  Result Date: 02/26/2022 Lora Havens, MD     02/26/2022  6:10 AM Patient Name: Lawrence [name] MRN: [id number] Epilepsy Attending: Lora Havens Referring Physician/Provider: Clarice Pole, NP Date: 02/24/2022 Duration: 22.22 mins Patient history: 66yo M with shaking spells along with the left sided weakness. EEG to evaluate for seizure Level of alertness: Awake, asleep AEDs during EEG study: LEV, LCM, TPM, PGB Technical aspects: This EEG study was done with scalp electrodes positioned according to the 10-20 International system of electrode placement. Electrical activity was acquired at a sampling rate of '500Hz'$  and reviewed with a high frequency filter of '70Hz'$  and a low frequency filter of '1Hz'$ . EEG data were recorded continuously and digitally stored. Description: The posterior dominant rhythm consists of 8-9 Hz activity of moderate voltage (25-35 uV) seen predominantly in posterior head  regions, symmetric and reactive to eye opening and eye closing. Sleep was characterized by vertex waves, sleep spindles (12 to 14 Hz), maximal frontocentral region.   Hyperventilation and photic stimulation were not performed.   IMPRESSION: This study is within normal limits. No seizures or epileptiform discharges were seen throughout the recording. Lora Havens   ECHOCARDIOGRAM COMPLETE  Result Date: 02/25/2022    ECHOCARDIOGRAM REPORT   Patient Name:   Lawrence [name] Date of Exam: 02/25/2022 Medical Rec #:  [id number]        Height:       74.0 in Accession #:    [id number]       Weight:       369.0 lb Date of Birth:  [birth year]        BSA:           2.820 m Patient Age:    43 years         BP:           113/77 mmHg Patient Gender: M                HR:           94 bpm. Exam Location:  Inpatient Procedure: 2D Echo, Color Doppler and Cardiac Doppler Indications:     Stroke I63.9  History:         Patient has prior history of Echocardiogram examinations, most                  recent 08/10/2021. CHF, CAD, CardioMEMS, COPD, Aortic Valve                  Disease, Arrythmias:Tachycardia; Risk Factors:Diabetes, Sleep                  Apnea, Obesity and Former Smoker.  Sonographer:     Eartha Inch Referring Phys:  [id number] ERIC CHEN Diagnosing Phys: Jenkins Rouge MD  Sonographer Comments: Patient is morbidly obese and suboptimal parasternal window. Image acquisition challenging due to patient body habitus. IMPRESSIONS  1. Left ventricular ejection fraction, by estimation, is 60 to 65%. The left ventricle has normal function. The left ventricle has no regional wall motion abnormalities. Left ventricular diastolic parameters are indeterminate.  2. Right ventricular systolic function is normal. The right ventricular size is normal.  3. The mitral valve is abnormal. Trivial mitral valve regurgitation. Moderate mitral annular calcification.  4. No mean gradient recorded peak velocity around 3 m/sec suggesting moderate AS with peak gradient 36 mmHg Tech went back to do more dopper. Peak velocity 3.2 m/sec mean gradient 29 peak 41 mmHg moderate AS . The aortic valve is tricuspid. Aortic valve  regurgitation is not visualized. Moderate aortic valve stenosis.  5. Aortic dilatation noted. There is mild dilatation of the ascending aorta, measuring 39 mm. FINDINGS  Left Ventricle: Left ventricular ejection fraction, by estimation, is 60 to 65%. The left ventricle has normal function. The left ventricle has no regional wall motion abnormalities. The left ventricular internal cavity size was normal in size. There is  no left ventricular hypertrophy. Left ventricular diastolic  parameters are indeterminate. Right Ventricle: The right ventricular size is normal. Right vetricular wall thickness was not assessed. Right ventricular systolic function is normal. Left Atrium: Left atrial size was normal in size. Right Atrium: Right atrial size was normal in size. Pericardium: Trivial pericardial effusion is present. The pericardial effusion is anterior to the right ventricle. Mitral Valve: The mitral valve is abnormal.  There is mild thickening of the mitral valve leaflet(s). There is mild calcification of the mitral valve leaflet(s). Moderate mitral annular calcification. Trivial mitral valve regurgitation. Tricuspid Valve: The tricuspid valve is normal in structure. Tricuspid valve regurgitation is mild. Aortic Valve: No mean gradient recorded peak velocity around 3 m/sec suggesting moderate AS with peak gradient 36 mmHg Tech went back to do more dopper. Peak velocity 3.2 m/sec mean gradient 29 peak 41 mmHg moderate AS. The aortic valve is tricuspid. Aortic valve regurgitation is not visualized. Moderate aortic stenosis is present. Aortic valve mean gradient measures 20.0 mmHg. Aortic valve peak gradient measures 35.9 mmHg. Pulmonic Valve: The pulmonic valve was not well visualized. Pulmonic valve regurgitation is not visualized. Aorta: Aortic dilatation noted. There is mild dilatation of the ascending aorta, measuring 39 mm. IAS/Shunts: The interatrial septum was not well visualized.  LEFT VENTRICLE PLAX 2D LVOT diam:     2.73 cm LVOT Area:     5.86 cm  AORTIC VALVE AV Vmax:      299.60 cm/s AV Vmean:     208.000 cm/s AV VTI:       0.556 m AV Peak Grad: 35.9 mmHg AV Mean Grad: 20.0 mmHg  SHUNTS Systemic Diam: 2.73 cm Jenkins Rouge MD Electronically signed by Jenkins Rouge MD Signature Date/Time: 02/25/2022/12:02:32 PM    Final (Updated)    MR BRAIN WO CONTRAST  Result Date: 02/25/2022 CLINICAL DATA:  Provided history: Neuro deficit, acute, stroke suspected. EXAM: MRI HEAD WITHOUT CONTRAST  TECHNIQUE: Multiplanar, multiecho pulse sequences of the brain and surrounding structures were obtained without intravenous contrast. COMPARISON:  CT angiogram head/neck 02/25/2022. Non-contrast head CT 02/24/2022. Brain MRI 04/27/2021. FINDINGS: Intermittently motion degraded examination, limiting evaluation and most notably as follows. Severe motion degradation of the sagittal T1 weighted sequence. Moderate motion degradation of the axial T2 sequence. Moderate motion degradation of the axial T2 FLAIR sequence. Severe motion degradation of the axial T1 weighted sequence. Moderate motion degradation of the axial SWI sequence. Moderate to severe motion degradation of the coronal T2 sequence. Brain: No age advanced or lobar predominant parenchymal atrophy. The axial diffusion-weighted imaging is of good quality. No evidence of acute infarct. Within the limitations of motion degradation, no cortical encephalomalacia is identified. No significant cerebral white matter disease for age. No appreciable intracranial mass or extra-axial fluid collection. No midline shift. Vascular: Maintained flow voids within the proximal large arterial vessels. Skull and upper cervical spine: Within described limitations, no focal suspicious marrow lesion is identified. Sinuses/Orbits: No mass or acute finding within the imaged orbits. Mild mucosal thickening within the inferior right maxillary sinus, and within the bilateral ethmoid air cells. IMPRESSION: Significantly motion degraded examination, as described and limiting evaluation. No evidence of acute intracranial abnormality. Specifically, the axial diffusion-weighted sequence is of good quality and there is no evidence of acute infarct. Electronically Signed   By: Kellie Simmering D.O.   On: 02/25/2022 12:59   CT ANGIO HEAD NECK W WO CM (CODE STROKE)  Result Date: 02/25/2022 CLINICAL DATA:  Code stroke EXAM: CT ANGIOGRAPHY HEAD AND NECK TECHNIQUE: Multidetector CT imaging of the  head and neck was performed using the standard protocol during bolus administration of intravenous contrast. Multiplanar CT image reconstructions and MIPs were obtained to evaluate the vascular anatomy. Carotid stenosis measurements (when applicable) are obtained utilizing NASCET criteria, using the distal internal carotid diameter as the denominator. RADIATION DOSE REDUCTION: This exam was performed according to the departmental dose-optimization program which includes automated exposure control,  adjustment of the mA and/or kV according to patient size and/or use of iterative reconstruction technique. CONTRAST:  43m OMNIPAQUE IOHEXOL 350 MG/ML SOLN COMPARISON:  None Available. FINDINGS: CTA NECK FINDINGS SKELETON: There is no bony spinal canal stenosis. No lytic or blastic lesion. OTHER NECK: Normal pharynx, larynx and major salivary glands. No cervical lymphadenopathy. Unremarkable thyroid gland. UPPER CHEST: No pneumothorax or pleural effusion. No nodules or masses. AORTIC ARCH: There is calcific atherosclerosis of the aortic arch. There is no aneurysm, dissection or hemodynamically significant stenosis of the visualized portion of the aorta. Conventional 3 vessel aortic branching pattern. The visualized proximal subclavian arteries are widely patent. RIGHT CAROTID SYSTEM: Normal without aneurysm, dissection or stenosis. LEFT CAROTID SYSTEM: Normal without aneurysm, dissection or stenosis. VERTEBRAL ARTERIES: Left dominant configuration. Both origins are clearly patent. There is no dissection, occlusion or flow-limiting stenosis to the skull base (V1-V3 segments). CTA HEAD FINDINGS POSTERIOR CIRCULATION: --Vertebral arteries: Normal V4 segments. --Inferior cerebellar arteries: Normal. --Basilar artery: Normal. --Superior cerebellar arteries: Normal. --Posterior cerebral arteries (PCA): Normal. ANTERIOR CIRCULATION: --Intracranial internal carotid arteries: Normal. --Anterior cerebral arteries (ACA): Normal. Both  A1 segments are present. Patent anterior communicating artery (a-comm). --Middle cerebral arteries (MCA): Normal. VENOUS SINUSES: As permitted by contrast timing, patent. ANATOMIC VARIANTS: None Review of the MIP images confirms the above findings. IMPRESSION: 1. No emergent large vessel occlusion or high-grade stenosis of the intracranial arteries. 2. Aortic Atherosclerosis (ICD10-I70.0). Electronically Signed   By: KUlyses JarredM.D.   On: 02/25/2022 00:36   CT HEAD CODE STROKE WO CONTRAST  Result Date: 02/25/2022 CLINICAL DATA:  Code stroke.  Acute neurologic deficit EXAM: CT HEAD WITHOUT CONTRAST TECHNIQUE: Contiguous axial images were obtained from the base of the skull through the vertex without intravenous contrast. RADIATION DOSE REDUCTION: This exam was performed according to the departmental dose-optimization program which includes automated exposure control, adjustment of the mA and/or kV according to patient size and/or use of iterative reconstruction technique. COMPARISON:  None Available. FINDINGS: Brain: There is no mass, hemorrhage or extra-axial collection. The size and configuration of the ventricles and extra-axial CSF spaces are normal. There is hypoattenuation of the periventricular white matter, most commonly indicating chronic ischemic microangiopathy. Vascular: No abnormal hyperdensity of the major intracranial arteries or dural venous sinuses. No intracranial atherosclerosis. Skull: The visualized skull base, calvarium and extracranial soft tissues are normal. Sinuses/Orbits: No fluid levels or advanced mucosal thickening of the visualized paranasal sinuses. No mastoid or middle ear effusion. The orbits are normal. ASPECTS ([facility] Early CT Score) - Ganglionic level infarction (caudate, lentiform nuclei, internal capsule, insula, M1-M3 cortex): 7 - Supraganglionic infarction (M4-M6 cortex): 3 Total score (0-10 with 10 being normal): 10 IMPRESSION: 1. No acute intracranial  abnormality. 2. ASPECTS is 10. These results were communicated to Dr. SDonnetta Simpersat 12:04 am on 02/25/2022 by text page via the [facility]messaging system. Electronically Signed   By: KUlyses JarredM.D.   On: 02/25/2022 00:05     PHYSICAL EXAM  Temp:  [97.4 F (36.3 C)-98.6 F (37 C)] 98.6 F (37 C) (07/22 0809) Pulse Rate:  [92-100] 92 (07/22 1146) Resp:  [15-25] 18 (07/22 1146) BP: (89-125)/(51-85) 109/71 (07/22 1146) SpO2:  [92 %-94 %] 92 % (07/22 1146) Weight:  [169.5 kg] 169.5 kg (07/21 2026)  General - morbid obesity, well developed, in no apparent distress.  Ophthalmologic - fundi not visualized due to noncooperation.  Cardiovascular - Regular rhythm and rate, not in A-fib.  Mental Status -  Level of arousal and orientation to time, place, and person were intact. Language including expression, naming, repetition, comprehension was assessed and found intact. Fund of Knowledge was assessed and was intact.  Cranial Nerves II - XII - II - Visual field intact OU. III, IV, VI - Extraocular movements intact. V - Facial sensation intact bilaterally. VII - Facial movement intact bilaterally. VIII - Hearing & vestibular intact bilaterally. X - Palate elevates symmetrically. XI - Chin turning & shoulder shrug intact bilaterally. XII - Tongue protrusion intact.  Motor Strength - The patient's strength was normal in RUE and RLE, however, mild left upper and lower extremity weakness due to lack of effort.  Bulk was normal and fasciculations were absent.   Motor Tone - Muscle tone was assessed at the neck and appendages and was normal.  Reflexes - The patient's reflexes were symmetrical in all extremities and he had no pathological reflexes.  Sensory - Light touch, temperature/pinprick were assessed and were subjectively decreased on the left.    Coordination - The patient had normal movements in the hands with no ataxia or dysmetria.  Tremor was absent.  Gait and Station -  deferred.   ASSESSMENT/PLAN Lawrence [name] is a 67 y.o. male with history of A-fib on Eliquis, hypertension, diabetes, CAD, morbid obesity, COPD, OSA on CPAP, seizure on Keppra admitted for headache, left-sided weakness and numbness. No tPA given due to on Eliquis.    ? Partial seizure vs. recrudescence from previous stroke CT no acute abnormality CT head and neck unremarkable MRI no acute infarct 2D Echo EF 60 to 65%.  Previous TEE in 08/2021 showed no PFO  EEG normal LDL 62 in 12/2021 HgbA1c 8.0 in 10/2021 TCD's for VTE prophylaxis Eliquis (apixaban) daily prior to admission, now on aspirin 81 mg daily and Eliquis (apixaban) daily.  Continue on discharge. Patient counseled to be compliant with his antithrombotic medications Ongoing aggressive stroke risk factor management Therapy recommendations: Home health PT Disposition: Likely home today  History of seizure-like activity 10/2020 admitted for CHF, SOB and LLE cellulitis.  Reported seizure-like activity lasting 30 seconds with bilateral upper extremity jerking.  MRI/EEG negative.  Put on Keppra 1 g twice daily. Keppra continued for 1 months and self stopped 02/2021 admitted for left LE weakness and lethargy.  MRI showed no acute infarct.  MRI C-spine showed spinal stenosis, no spinal cord compression.  Discharged on outpatient PT/OT with plan for EMS/NCS as outpatient. 04/2021 to follow-up with Dr. April Manson [facility] 04/2021 admitted for headache and left-sided weakness.  MRI and EEG negative. 08/2021 admitted for right hand jerking EEG negative.  TEE no PFO.  Put on Keppra 1 g twice daily 09/2021 follow with Dr. April Manson, decrease her Keppra XR 1 g daily  Paroxysmal A-fib On Eliquis PTA Currently in sinus rhythm Continue home Cardizem and Eliquis  Diabetes HgbA1c 8.0 in 10/2021 goal < 7.0 Uncontrolled CBG monitoring SSI DM education and close PCP follow up  Hypertension BP soft but improving, management per primary team On  metoprolol, prazosin, torsemide and spironolactone Long term BP goal normotensive  Hyperlipidemia Home meds: Lipitor 40 LDL 62 in 12/2021, goal < 70 Now on Lipitor 40 Continue statin at discharge  Other Stroke Risk Factors Morbid obesity, Body mass index is 47.98 kg/m.  Coronary artery disease Obstructive sleep apnea, on CPAP at home  Other Active Problems COPD Leukocytosis WBC 11.6  Hospital day # 0  Neurology will sign off. Please call with questions. Pt will follow  up with Dr. April Manson at [facility] on 03/24/2022. Thanks for the consult.   Rosalin Hawking, MD PhD Stroke Neurology 02/26/2022 2:50 PM    To contact Stroke Continuity provider, please refer to [email or web address] After hours, contact General Neurology

## 2022-02-26 NOTE — Care Management Obs Status (Signed)
[place] NOTIFICATION   Patient Details  Name: KNOWLEDGE ESCANDON MRN: [id number] Date of Birth: 21-Oct-1955   Medicare Observation Status Notification Given:  Yes    Carles Collet, RN 02/26/2022, 11:26 AM

## 2022-02-26 NOTE — Progress Notes (Signed)
Physical Therapy Treatment Patient Details Name: Lawrence [name] MRN: [id number] DOB: April 22, 1956 Today's Date: 02/26/2022   History of Present Illness Pt is a 66 year old male presented to ED with slurred speech, left-sided weakness. CT/CTA-negative, MRI of the brain negative for any acute findings. Pt found to have breakthrough seizures, reactivation of old stroke sxs versus less likely acute stroke (MRI - no acute process). PHMx: morbid obesity, OSA, type 2 diabetes, history of prior stroke with residual left-sided hemiparesis, chronic back pain status post spinal stimulator, COPD, paroxysmal A-fib    PT Comments    Pt making slow, steady progress overall with functional mobility. Pt would continue to benefit from skilled physical therapy services at this time while admitted and after d/c to address the below listed limitations in order to improve overall safety and independence with functional mobility.   Recommendations for follow up therapy are one component of a multi-disciplinary discharge planning process, led by the attending physician.  Recommendations may be updated based on patient status, additional functional criteria and insurance authorization.  Follow Up Recommendations  Home health PT     Assistance Recommended at Discharge Frequent or constant Supervision/Assistance  Patient can return home with the following A little help with walking and/or transfers;A little help with bathing/dressing/bathroom;Assistance with cooking/housework;Assist for transportation   Equipment Recommendations  None recommended by PT    Recommendations for Other Services       Precautions / Restrictions Precautions Precautions: Fall Restrictions Weight Bearing Restrictions: No     Mobility  Bed Mobility               General bed mobility comments: pt OOB in recliner chair upon PT arrival    Transfers Overall transfer level: Needs assistance Equipment used: Rolling walker (2  wheels) Transfers: Sit to/from Stand Sit to Stand: Min assist           General transfer comment: increased time and effort, min A to fully power up into standing    Ambulation/Gait Ambulation/Gait assistance: Min guard Gait Distance (Feet): 30 Feet Assistive device: Rolling walker (2 wheels) Gait Pattern/deviations: Step-through pattern, Decreased stride length Gait velocity: Decreased     General Gait Details: pt with slow, steady gait overall with RW; again noting the R knee instability and lateral movement with stance phase which pt reports as baseline   Marine scientist Rankin (Stroke Patients Only)       Balance Overall balance assessment: Needs assistance Sitting-balance support: No upper extremity supported, Feet supported Sitting balance-Leahy Scale: Good     Standing balance support: Bilateral upper extremity supported, Reliant on assistive device for balance Standing balance-Leahy Scale: Poor                              Cognition Arousal/Alertness: Awake/alert Behavior During Therapy: WFL for tasks assessed/performed Overall Cognitive Status: Within Functional Limits for tasks assessed                                 General Comments: very sweet, pleasant and agreeable        Exercises      General Comments        Pertinent Vitals/Pain Pain Assessment Pain Assessment: No/denies pain    Home Living  Prior Function            PT Goals (current goals can now be found in the care plan section) Acute Rehab PT Goals PT Goal Formulation: With patient Time For Goal Achievement: 03/11/22 Potential to Achieve Goals: Good Progress towards PT goals: Progressing toward goals    Frequency    Min 3X/week      PT Plan Frequency needs to be updated    Co-evaluation              AM-PAC PT "6 Clicks" Mobility   Outcome Measure   Help needed turning from your back to your side while in a flat bed without using bedrails?: A Little Help needed moving from lying on your back to sitting on the side of a flat bed without using bedrails?: A Little Help needed moving to and from a bed to a chair (including a wheelchair)?: A Little Help needed standing up from a chair using your arms (e.g., wheelchair or bedside chair)?: A Little Help needed to walk in hospital room?: A Little Help needed climbing 3-5 steps with a railing? : A Lot 6 Click Score: 17    End of Session Equipment Utilized During Treatment: Gait belt Activity Tolerance: Patient tolerated treatment well Patient left: in chair;with call bell/phone within reach;with chair alarm set Nurse Communication: Mobility status PT Visit Diagnosis: Unsteadiness on feet (R26.81);Muscle weakness (generalized) (M62.81);Difficulty in walking, not elsewhere classified (R26.2)     Time: 2751-7001 PT Time Calculation (min) (ACUTE ONLY): 17 min  Charges:  $Gait Training: 8-22 mins                     Anastasio Champion, DPT  Acute Rehabilitation Services Office [place] 02/26/2022, 12:14 PM

## 2022-03-03 ENCOUNTER — Ambulatory Visit (HOSPITAL_COMMUNITY)
Admission: RE | Admit: 2022-03-03 | Discharge: 2022-03-03 | Disposition: A | Discharge status: Home / Self Care | Payer: Medicare HMO | Source: Ambulatory Visit | Attending: Internal Medicine | Admitting: Internal Medicine

## 2022-03-03 DIAGNOSIS — E7800 Pure hypercholesterolemia, unspecified: Secondary | ICD-10-CM | POA: Insufficient documentation

## 2022-03-03 DIAGNOSIS — I5032 Chronic diastolic (congestive) heart failure: Secondary | ICD-10-CM

## 2022-03-03 DIAGNOSIS — R6889 Other general symptoms and signs: Secondary | ICD-10-CM | POA: Diagnosis not present

## 2022-03-03 LAB — LIPID PANEL
Cholesterol: 142 mg/dL (ref 0–200)
HDL: 30 mg/dL — ABNORMAL LOW (ref >40)
LDL Cholesterol: 60 mg/dL (ref 0–99)
Total CHOL/HDL Ratio: 4.7 RATIO
Triglycerides: 261 mg/dL — ABNORMAL HIGH (ref <150)
VLDL: 52 mg/dL — ABNORMAL HIGH (ref 0–40)

## 2022-03-04 DIAGNOSIS — E1142 Type 2 diabetes mellitus with diabetic polyneuropathy: Secondary | ICD-10-CM | POA: Diagnosis not present

## 2022-03-04 DIAGNOSIS — Z9689 Presence of other specified functional implants: Secondary | ICD-10-CM | POA: Diagnosis not present

## 2022-03-04 DIAGNOSIS — R6889 Other general symptoms and signs: Secondary | ICD-10-CM | POA: Diagnosis not present

## 2022-03-08 DIAGNOSIS — E1169 Type 2 diabetes mellitus with other specified complication: Secondary | ICD-10-CM | POA: Diagnosis not present

## 2022-03-08 DIAGNOSIS — R6889 Other general symptoms and signs: Secondary | ICD-10-CM | POA: Diagnosis not present

## 2022-03-08 DIAGNOSIS — I10 Essential (primary) hypertension: Secondary | ICD-10-CM | POA: Diagnosis not present

## 2022-03-08 DIAGNOSIS — R569 Unspecified convulsions: Secondary | ICD-10-CM | POA: Diagnosis not present

## 2022-03-09 ENCOUNTER — Emergency Department (HOSPITAL_COMMUNITY)
Admission: EM | Admit: 2022-03-09 | Discharge: 2022-03-10 | Disposition: A | Discharge status: Home / Self Care | Payer: No Typology Code available for payment source | Attending: Emergency Medicine | Admitting: Emergency Medicine

## 2022-03-09 ENCOUNTER — Encounter: Payer: Self-pay | Admitting: Adult Health

## 2022-03-09 ENCOUNTER — Ambulatory Visit (INDEPENDENT_AMBULATORY_CARE_PROVIDER_SITE_OTHER): Payer: Medicare HMO | Admitting: Adult Health

## 2022-03-09 ENCOUNTER — Other Ambulatory Visit: Payer: Self-pay

## 2022-03-09 ENCOUNTER — Emergency Department (HOSPITAL_COMMUNITY): Payer: No Typology Code available for payment source

## 2022-03-09 DIAGNOSIS — Z8673 Personal history of transient ischemic attack (TIA), and cerebral infarction without residual deficits: Secondary | ICD-10-CM | POA: Diagnosis not present

## 2022-03-09 DIAGNOSIS — G4700 Insomnia, unspecified: Secondary | ICD-10-CM | POA: Diagnosis not present

## 2022-03-09 DIAGNOSIS — Z794 Long term (current) use of insulin: Secondary | ICD-10-CM | POA: Insufficient documentation

## 2022-03-09 DIAGNOSIS — Z7984 Long term (current) use of oral hypoglycemic drugs: Secondary | ICD-10-CM | POA: Diagnosis not present

## 2022-03-09 DIAGNOSIS — Z7901 Long term (current) use of anticoagulants: Secondary | ICD-10-CM | POA: Diagnosis not present

## 2022-03-09 DIAGNOSIS — Z7982 Long term (current) use of aspirin: Secondary | ICD-10-CM | POA: Insufficient documentation

## 2022-03-09 DIAGNOSIS — R569 Unspecified convulsions: Secondary | ICD-10-CM | POA: Diagnosis present

## 2022-03-09 DIAGNOSIS — R059 Cough, unspecified: Secondary | ICD-10-CM | POA: Insufficient documentation

## 2022-03-09 DIAGNOSIS — F411 Generalized anxiety disorder: Secondary | ICD-10-CM

## 2022-03-09 DIAGNOSIS — F4310 Post-traumatic stress disorder, unspecified: Secondary | ICD-10-CM | POA: Diagnosis not present

## 2022-03-09 DIAGNOSIS — D72829 Elevated white blood cell count, unspecified: Secondary | ICD-10-CM | POA: Insufficient documentation

## 2022-03-09 DIAGNOSIS — G40909 Epilepsy, unspecified, not intractable, without status epilepticus: Secondary | ICD-10-CM | POA: Diagnosis not present

## 2022-03-09 DIAGNOSIS — F331 Major depressive disorder, recurrent, moderate: Secondary | ICD-10-CM

## 2022-03-09 DIAGNOSIS — R6889 Other general symptoms and signs: Secondary | ICD-10-CM | POA: Diagnosis not present

## 2022-03-09 DIAGNOSIS — R600 Localized edema: Secondary | ICD-10-CM | POA: Diagnosis not present

## 2022-03-09 DIAGNOSIS — F410 Panic disorder [episodic paroxysmal anxiety] without agoraphobia: Secondary | ICD-10-CM

## 2022-03-09 LAB — URINALYSIS, ROUTINE W REFLEX MICROSCOPIC
Bacteria, UA: NONE SEEN
Bilirubin Urine: NEGATIVE
Glucose, UA: >=500 mg/dL — AB
Hgb urine dipstick: NEGATIVE
Ketones, ur: NEGATIVE mg/dL
Leukocytes,Ua: NEGATIVE
Nitrite: NEGATIVE
Protein, ur: NEGATIVE mg/dL
Specific Gravity, Urine: 1.009 (ref 1.005–1.030)
pH: 5 (ref 5.0–8.0)

## 2022-03-09 LAB — COMPREHENSIVE METABOLIC PANEL
ALT: 22 U/L (ref 0–44)
AST: 19 U/L (ref 15–41)
Albumin: 3.6 g/dL (ref 3.5–5.0)
Alkaline Phosphatase: 66 U/L (ref 38–126)
Anion gap: 12 (ref 5–15)
BUN: 21 mg/dL (ref 8–23)
CO2: 24 mmol/L (ref 22–32)
Calcium: 8.8 mg/dL — ABNORMAL LOW (ref 8.9–10.3)
Chloride: 103 mmol/L (ref 98–111)
Creatinine, Ser: 1 mg/dL (ref 0.61–1.24)
GFR, Estimated: >60 mL/min (ref >60)
Glucose, Bld: 100 mg/dL — ABNORMAL HIGH (ref 70–99)
Potassium: 3.6 mmol/L (ref 3.5–5.1)
Sodium: 139 mmol/L (ref 135–145)
Total Bilirubin: 0.6 mg/dL (ref 0.3–1.2)
Total Protein: 6.8 g/dL (ref 6.5–8.1)

## 2022-03-09 LAB — CBC WITH DIFFERENTIAL/PLATELET
Abs Immature Granulocytes: 0.07 10*3/uL (ref 0.00–0.07)
Basophils Absolute: 0.1 10*3/uL (ref 0.0–0.1)
Basophils Relative: 0 %
Eosinophils Absolute: 0.3 10*3/uL (ref 0.0–0.5)
Eosinophils Relative: 3 %
HCT: 45.9 % (ref 39.0–52.0)
Hemoglobin: 14.8 g/dL (ref 13.0–17.0)
Immature Granulocytes: 1 %
Lymphocytes Relative: 20 %
Lymphs Abs: 2.2 10*3/uL (ref 0.7–4.0)
MCH: 29 pg (ref 26.0–34.0)
MCHC: 32.2 g/dL (ref 30.0–36.0)
MCV: 89.8 fL (ref 80.0–100.0)
Monocytes Absolute: 0.7 10*3/uL (ref 0.1–1.0)
Monocytes Relative: 7 %
Neutro Abs: 7.9 10*3/uL — ABNORMAL HIGH (ref 1.7–7.7)
Neutrophils Relative %: 69 %
Platelets: 281 10*3/uL (ref 150–400)
RBC: 5.11 MIL/uL (ref 4.22–5.81)
RDW: 14.7 % (ref 11.5–15.5)
WBC: 11.2 10*3/uL — ABNORMAL HIGH (ref 4.0–10.5)
nRBC: 0 % (ref 0.0–0.2)

## 2022-03-09 LAB — BRAIN NATRIURETIC PEPTIDE: B Natriuretic Peptide: 18.6 pg/mL (ref 0.0–100.0)

## 2022-03-09 MED ORDER — BUSPIRONE HCL 10 MG PO TABS: 10.0000 mg | ORAL_TABLET | Freq: Three times a day (TID) | ORAL | 3 refills | Status: DC

## 2022-03-09 MED ORDER — VENLAFAXINE HCL ER 75 MG PO CP24
225.0000 mg | ORAL_CAPSULE | Freq: Every day | ORAL | 3 refills | Status: DC
Start: 2022-03-09 — End: 2023-03-29

## 2022-03-09 MED ORDER — TRAZODONE HCL 100 MG PO TABS: 200.0000 mg | ORAL_TABLET | Freq: Every day | ORAL | 3 refills | Status: DC

## 2022-03-09 MED ORDER — PRAZOSIN HCL 2 MG PO CAPS: ORAL_CAPSULE | ORAL | 3 refills | Status: DC

## 2022-03-09 MED ORDER — TEMAZEPAM 15 MG PO CAPS
15.0000 mg | ORAL_CAPSULE | Freq: Every day | ORAL | 2 refills | Status: DC
Start: 2022-03-09 — End: 2022-05-17

## 2022-03-09 MED ORDER — ARIPIPRAZOLE 5 MG PO TABS: ORAL_TABLET | ORAL | 3 refills | Status: DC

## 2022-03-09 NOTE — ED Triage Notes (Addendum)
Pt reports having 3 seizures today. Is on KEPPRA. The patient was unable to start on new seizure medicine (Vimpat) prescribed a couple weeks ago due to insurance issues. Per patient finally started taking them today. Has no other complaints.

## 2022-03-09 NOTE — Progress Notes (Signed)
Lawrence [name] [id number] [birth year] 66 y.o.  Subjective:   Patient ID:  Lawrence [name] is a 66 y.o. (DOB 1955-09-18) male.  Chief Complaint: No chief complaint on file.   HPI Lawrence [name] presents to the office today for follow-up of panic attacks, MDD, GAD, PTSD, and insomnia.  Previously followed by [place].  Describes mood today as "ok". Pleasant. Tearful sometimes. Mood symptoms - reports some depression, anxiety, and irritability. Denies recent panic attacks. Stating "I'm making it". Recently hospitalized for 2 strokes - now home - recovering well. Feels like current medications are helpful. He and partner doing well. Stable interest and motivation. Taking medications as prescribed.  Energy levels lower overall. Active, does not have a regular exercise routine.   Enjoys some usual interests and activities. Lives with significant other. No family local. Has a good friend locally. Appetite fair. Weight loss 360 from 390 pounds - high protein, low carb diet. Sleeps well most nights. Averages 4 hours. Reports daytime napping. Focus and concentration difficulties - more so since stroke. Completing tasks. Managing aspects of household. Retired [name]. Disabled. Denies SI or HI.  Denies AH or VH.  Previous medication trials: Ambien   Flowsheet Row ED to Hosp-Admission (Discharged) from 02/24/2022 in [place] ED from 09/26/2021 in [place] Admission (Discharged) from 08/26/2021 in [place] CATH LAB  C-SSRS RISK CATEGORY No Risk No Risk No Risk        Review of Systems:  Review of Systems  Musculoskeletal:  Negative for gait problem.  Neurological:  Negative for tremors.  Psychiatric/Behavioral:         Please refer to HPI    Medications: I have reviewed the patient's current medications.  Current Outpatient Medications  Medication Sig Dispense Refill   Accu-Chek FastClix Lancets  MISC 1 each by Other route in the morning and at bedtime.     acetaminophen (TYLENOL) 325 MG tablet Take 2 tablets (650 mg total) by mouth every 4 (four) hours as needed for mild pain (or temp > 37.5 C (99.5 F)).     albuterol (VENTOLIN HFA) 108 (90 Base) MCG/ACT inhaler Inhale 2 puffs into the lungs every 6 (six) hours as needed for wheezing or shortness of breath. 1 each 6   Alcohol Swabs (ALCOHOL PREP) 70 % PADS 1 each by Other route in the morning, at noon, and at bedtime.     allopurinol (ZYLOPRIM) 100 MG tablet Take 200 mg by mouth daily.     ARIPiprazole (ABILIFY) 5 MG tablet TAKE ONE TABLET BY MOUTH EVERYDAY AT BEDTIME (Patient taking differently: Take 5 mg by mouth at bedtime.) 90 tablet 3   aspirin EC 81 MG tablet Take 1 tablet (81 mg total) by mouth daily. Swallow whole. 30 tablet 12   atorvastatin (LIPITOR) 40 MG tablet Take 1 tablet (40 mg total) by mouth every evening. TAKE ONE TABLET BY MOUTH DAILY AT 6PM 90 tablet 3   bromocriptine (PARLODEL) 2.5 MG tablet Take 1 tablet (2.5 mg total) by mouth at bedtime. 90 tablet 3   busPIRone (BUSPAR) 5 MG tablet Take 1 tablet (5 mg total) by mouth 3 (three) times daily. 270 tablet 3   cetirizine (ZYRTEC) 10 MG tablet Take 10 mg by mouth at bedtime.      Cholecalciferol (VITAMIN D3 PO) Take 2,000 Units by mouth daily.     diltiazem (CARDIZEM CD) 180 MG 24 hr capsule TAKE ONE CAPSULE BY  MOUTH ONCE DAILY (Patient taking differently: Take 180 mg by mouth daily.) 90 capsule 1   ELIQUIS 5 MG TABS tablet TAKE ONE TABLET BY MOUTH TWICE DAILY (Patient taking differently: Take 5 mg by mouth 2 (two) times daily.) 180 tablet 2   FARXIGA 10 MG TABS tablet TAKE ONE TABLET BY MOUTH BEFORE BREAKFAST (Patient taking differently: Take 10 mg by mouth daily.) 90 tablet 3   ferrous sulfate 324 (65 Fe) MG TBEC Take 324 mg by mouth daily with breakfast.      fluticasone (FLONASE) 50 MCG/ACT nasal spray Place 2 sprays into both nostrils daily.       Fluticasone-Umeclidin-Vilant (TRELEGY ELLIPTA) 200-62.5-25 MCG/ACT AEPB Inhale 1 puff into the lungs daily. 60 each 11   icosapent Ethyl (VASCEPA) 1 g capsule Take 2 capsules (2 g total) by mouth 2 (two) times daily. 120 capsule 5   insulin degludec (TRESIBA FLEXTOUCH) 100 UNIT/ML FlexTouch Pen Max daily dose 30 Units (Patient taking differently: Inject 28 Units into the skin daily.) 15 mL 1   isosorbide mononitrate (IMDUR) 30 MG 24 hr tablet TAKE ONE TABLET BY MOUTH EVERY EVENING (Patient taking differently: Take 30 mg by mouth daily.) 90 tablet 3   lacosamide (VIMPAT) 50 MG TABS tablet Take 1 tablet (50 mg total) by mouth 2 (two) times daily. Start with '50mg'$  BID and increase by '50mg'$  every week until at desired dose '200mg'$  BID. 100 tablet 0   levETIRAcetam (KEPPRA) 500 MG tablet Take 1 tablet (500 mg total) by mouth 2 (two) times daily. 120 tablet 11   metFORMIN (GLUCOPHAGE-XR) 500 MG 24 hr tablet Take 4 tablets (2,000 mg total) by mouth daily. (Patient taking differently: Take 1,000 mg by mouth 2 (two) times daily with a meal.) 360 tablet 3   metoprolol succinate (TOPROL-XL) 50 MG 24 hr tablet Take 1 tablet by mouth daily. Take with or immediately following a meal. (Patient taking differently: Take 50 mg by mouth daily.) 90 tablet 1   omeprazole (PRILOSEC) 20 MG capsule Take 20 mg by mouth every morning.     potassium chloride SA (KLOR-CON M) 20 MEQ tablet Take 2 tablets (40 mEq total) by mouth 2 (two) times daily. 270 tablet 3   prazosin (MINIPRESS) 2 MG capsule TAKE TWO CAPSULES BY MOUTH EVERYDAY AT BEDTIME (Patient taking differently: Take 4 mg by mouth at bedtime.) 180 capsule 3   pregabalin (LYRICA) 150 MG capsule Take 1 capsule (150 mg total) by mouth 3 (three) times daily. 270 capsule 0   repaglinide (PRANDIN) 2 MG tablet Take 2 tablets (4 mg total) by mouth 2 (two) times daily before a meal. 360 tablet 3   spironolactone (ALDACTONE) 25 MG tablet Take 1 tablet (25 mg total) by mouth daily. 90  tablet 2   temazepam (RESTORIL) 15 MG capsule TAKE ONE CAPSULE BY MOUTH EVERYDAY AT BEDTIME (Patient taking differently: Take 15 mg by mouth at bedtime.) 30 capsule 2   torsemide (DEMADEX) 20 MG tablet Take 4 tablets (80 mg total) by mouth every morning AND 3 tablets (60 mg total) every evening. 450 tablet 3   traZODone (DESYREL) 100 MG tablet Take 2 tablets (200 mg total) by mouth at bedtime. Take 200 mg by mouth at bedtime. 258 tablet 3   TRULICITY 4.5 NI/7.7OE SOPN INJECT 4.'5mg'$  into THE SKIN ONCE A WEEK (Patient taking differently: Inject 4.5 mg into the skin once a week. Monday) 6 mL 3   venlafaxine XR (EFFEXOR-XR) 75 MG 24 hr capsule Take 3  capsules (225 mg total) by mouth daily with breakfast. 270 capsule 3   No current facility-administered medications for this visit.    Medication Side Effects: None  Allergies:  Allergies  Allergen Reactions   Vancomycin Other (See Comments)    "Red Man Syndrome" 02/02/17: possible cause for rash under both arms   Niacin Other (See Comments)   Niacin And Related Other (See Comments)    Red man syndrome   Tubersol [Tuberculin, Ppd] Other (See Comments)    Reaction unknown   Doxycycline Rash and Other (See Comments)    Past Medical History:  Diagnosis Date   Acquired dilation of ascending aorta and aortic root (HCC)    35m by echo 01/2021   Adenomatous colon polyp 2007   Anemia    Anxiety    Aortic stenosis    moderate AS by echo 01/2021   Asthma    BPH without obstruction/lower urinary tract symptoms 02/22/2017   Chronic diastolic (congestive) heart failure (HCC)    Chronic venous stasis 03/07/2019   COPD (chronic obstructive pulmonary disease) (HCC)    Coronary artery calcification seen on CAT scan    Depression    [facility] 09/11[facility]   History of colon polyps 08/24/2018   Hypertension    Morbid obesity (HCC)    OSA (obstructive sleep apnea)    Pain due to onychomycosis of toenails of both feet 09/11/2019    Peripheral neuropathy 02/22/2017   Primary osteoarthritis, left shoulder 03/05/2017   PTSD (post-traumatic stress disorder)    Pure hypercholesterolemia    QT prolongation 03/07/2019   Seizures (HCC)    Shortness of breath    Sinus tachycardia 03/07/2019   Sleep apnea    CPAP   Type 2 diabetes mellitus with vascular disease ([facility] 09/11/2019    Past Medical History, Surgical history, Social history, and Family history were reviewed and updated as appropriate.   Please see review of systems for further details on the patient's review from today.   Objective:   Physical Exam:  There were no vitals taken for this visit.  Physical Exam Constitutional:      General: He is not in acute distress. Musculoskeletal:        General: No deformity.  Neurological:     Mental Status: He is alert and oriented to person, place, and time.     Coordination: Coordination normal.  Psychiatric:        Attention and Perception: Attention and perception normal. He does not perceive auditory or visual hallucinations.        Mood and Affect: Mood normal. Mood is not anxious or depressed. Affect is not labile, blunt, angry or inappropriate.        Speech: Speech normal.        Behavior: Behavior normal.        Thought Content: Thought content normal. Thought content is not paranoid or delusional. Thought content does not include homicidal or suicidal ideation. Thought content does not include homicidal or suicidal plan.        Cognition and Memory: Cognition and memory normal.        Judgment: Judgment normal.     Comments: Insight intact     Lab Review:     Component Value Date/Time   NA 138 02/25/2022 0019   NA 136 01/07/2021 0000   K 3.5 02/25/2022 0019   CL 98 02/25/2022 0019   CO2 27 02/24/2022 0018   GLUCOSE 152 (H) 02/25/2022 0019  BUN 22 02/25/2022 0019   BUN 22 01/07/2021 0000   CREATININE 1.00 02/25/2022 0019   CALCIUM 8.8 (L) 02/24/2022 0018   PROT 6.4 (L) 02/24/2022 0018    ALBUMIN 3.3 (L) 02/24/2022 0018   AST 15 02/24/2022 0018   ALT 21 02/24/2022 0018   ALKPHOS 66 02/24/2022 0018   BILITOT 0.5 02/24/2022 0018   GFRNONAA >60 02/24/2022 0018   GFRAA 77 05/20/2020 1038       Component Value Date/Time   WBC 11.6 (H) 02/24/2022 0018   RBC 4.80 02/24/2022 0018   HGB 13.9 02/25/2022 0019   HCT 41.0 02/25/2022 0019   PLT 244 02/24/2022 0018   MCV 89.4 02/24/2022 0018   MCH 29.0 02/24/2022 0018   MCHC 32.4 02/24/2022 0018   RDW 14.8 02/24/2022 0018   LYMPHSABS 1.7 02/24/2022 0018   MONOABS 0.8 02/24/2022 0018   EOSABS 0.4 02/24/2022 0018   BASOSABS 0.1 02/24/2022 0018    No results found for: "POCLITH", "LITHIUM"   No results found for: "PHENYTOIN", "PHENOBARB", "VALPROATE", "CBMZ"   .res Assessment: Plan:     Plan:  PDMP reviewed  1. Prazosin '2mg'$  - 2 tabs at hs 2. Temazepam '15mg'$  at bedtime 3. Abilify '5mg'$  daily 4. Trazadone '100mg'$  - 2 at bedtime 5. Effexor XR '75mg'$  - 3 every morning 6. Buspar '5mg'$  TID   [facility] - pain management  RTC 3 months  Patient advised to contact office with any questions, adverse effects, or acute worsening in signs and symptoms.  Discussed potential metabolic side effects associated with atypical antipsychotics, as well as potential risk for movement side effects. Advised pt to contact office if movement side effects occur.   There are no diagnoses linked to this encounter.   Please see After Visit Summary for patient specific instructions.  Future Appointments  Date Time Provider [place]  03/09/2022  2:00 PM Hartman Minahan, Berdie Ogren, NP [facility] None  03/23/2022  9:15 AM Elayne Snare, MD [facility] None  03/24/2022  9:15 AM Alric Ran, MD [facility] None  03/31/2022  8:30 AM [facility] PA/NP [facility] None  04/29/2022  7:45 AM Gardiner Barefoot, DPM [facility]  12/01/2022  9:30 AM Lomax, Amy, NP [facility] None    No orders of the defined types were placed in this  encounter.   -------------------------------

## 2022-03-09 NOTE — ED Provider Notes (Signed)
 [facility] EMERGENCY DEPARTMENT Provider Note   CSN: [id number] Arrival date & time: 03/09/22  2049     History  No chief complaint on file.   Lawrence [name] is a 66 y.o. male.  He has a history of stroke with residual left-sided deficits.  He is also had some right arm clonic activity question focal seizure for which he is on Keppra .  He was recently in the hospital for worsening neurologic symptoms and Vimpat  was added although he was not able to start it due to insurance issues.  Tonight he was experiencing some right arm clonic activity that lasted about 30 minutes which is longer than he would typically have.  EMS said it was there when they responded although resolved during transport.  He is complaining of general tiredness which he attributes to his evening medications.  He said he has had a cough which is more chronic in nature no fever no chest pain.  He feels his other neurologic symptoms are at baseline.  No fevers or chills.  The history is provided by the patient and the EMS personnel.  Seizures Seizure activity on arrival: no   Seizure type:  Partial simple Initial focality:  Upper extremity Return to baseline: yes   Duration:  30 minutes Timing:  Once Progression:  Resolved Recent head injury:  No recent head injuries PTA treatment:  None History of seizures: yes        Home Medications Prior to Admission medications   Medication Sig Start Date End Date Taking? Authorizing Provider  Accu-Chek FastClix Lancets MISC 1 each by Other route in the morning and at bedtime. 12/22/20   [provider]  acetaminophen  (TYLENOL ) 325 MG tablet Take 2 tablets (650 mg total) by mouth every 4 (four) hours as needed for mild pain (or temp > 37.5 C (99.5 F)). 01/26/21   Abbe Abate, MD  albuterol  (VENTOLIN  HFA) 108 (90 Base) MCG/ACT inhaler Inhale 2 puffs into the lungs every 6 (six) hours as needed for wheezing or shortness of breath. 02/02/22    Hunsucker, Archer Kobs, MD  Alcohol  Swabs (ALCOHOL  PREP) 70 % PADS 1 each by Other route in the morning, at noon, and at bedtime. 12/22/20   [provider]  allopurinol  (ZYLOPRIM ) 100 MG tablet Take 200 mg by mouth daily.    [provider]  ARIPiprazole  (ABILIFY ) 5 MG tablet TAKE ONE TABLET BY MOUTH EVERYDAY AT BEDTIME 03/09/22   Mozingo, Regina Nattalie, NP  aspirin  EC 81 MG tablet Take 1 tablet (81 mg total) by mouth daily. Swallow whole. 02/27/22   Hoyt Macleod, MD  atorvastatin  (LIPITOR) 40 MG tablet Take 1 tablet (40 mg total) by mouth every evening. TAKE ONE TABLET BY MOUTH DAILY AT 6PM 06/24/21   Jacqueline Matsu, MD  bromocriptine  (PARLODEL ) 2.5 MG tablet Take 1 tablet (2.5 mg total) by mouth at bedtime. 10/14/21   Gwyndolyn Lerner, MD  busPIRone  (BUSPAR ) 10 MG tablet Take 1 tablet (10 mg total) by mouth 3 (three) times daily. 03/09/22   Mozingo, Regina Nattalie, NP  cetirizine (ZYRTEC) 10 MG tablet Take 10 mg by mouth at bedtime.     [provider]  Cholecalciferol  (VITAMIN D3 PO) Take 2,000 Units by mouth daily.    [provider]  diltiazem  (CARDIZEM  CD) 180 MG 24 hr capsule TAKE ONE CAPSULE BY MOUTH ONCE DAILY Patient taking differently: Take 180 mg by mouth daily. 01/06/22   Jacqueline Matsu, MD  ELIQUIS   5 MG TABS tablet TAKE ONE TABLET BY MOUTH TWICE DAILY Patient taking differently: Take 5 mg by mouth 2 (two) times daily. 01/06/22   Darlis Eisenmenger, MD  FARXIGA  10 MG TABS tablet TAKE ONE TABLET BY MOUTH BEFORE BREAKFAST Patient taking differently: Take 10 mg by mouth daily. 10/01/21   Gwyndolyn Lerner, MD  ferrous sulfate  324 (65 Fe) MG TBEC Take 324 mg by mouth daily with breakfast.     [provider]  fluticasone  (FLONASE ) 50 MCG/ACT nasal spray Place 2 sprays into both nostrils daily.     [provider]  Fluticasone -Umeclidin-Vilant (TRELEGY ELLIPTA ) 200-62.5-25 MCG/ACT AEPB Inhale 1 puff into the lungs daily. 10/18/21   Hunsucker, Archer Kobs,  MD  icosapent  Ethyl (VASCEPA ) 1 g capsule Take 2 capsules (2 g total) by mouth 2 (two) times daily. 12/27/21   Darlis Eisenmenger, MD  insulin  degludec (TRESIBA  FLEXTOUCH) 100 UNIT/ML FlexTouch Pen Max daily dose 30 Units Patient taking differently: Inject 28 Units into the skin daily. 12/24/21   Lajean Pike, MD  isosorbide  mononitrate (IMDUR ) 30 MG 24 hr tablet TAKE ONE TABLET BY MOUTH EVERY EVENING Patient taking differently: Take 30 mg by mouth daily. 10/01/21   Jacqueline Matsu, MD  lacosamide  (VIMPAT ) 50 MG TABS tablet Take 1 tablet (50 mg total) by mouth 2 (two) times daily. Start with 50mg  BID and increase by 50mg  every week until at desired dose 200mg  BID. 02/26/22   Dahal, Aminta Baldy, MD  levETIRAcetam  (KEPPRA ) 500 MG tablet Take 1 tablet (500 mg total) by mouth 2 (two) times daily. 09/14/21   Camara, Amadou, MD  metFORMIN  (GLUCOPHAGE -XR) 500 MG 24 hr tablet Take 4 tablets (2,000 mg total) by mouth daily. Patient taking differently: Take 1,000 mg by mouth 2 (two) times daily with a meal. 06/16/21   Gwyndolyn Lerner, MD  metoprolol  succinate (TOPROL -XL) 50 MG 24 hr tablet Take 1 tablet by mouth daily. Take with or immediately following a meal. Patient taking differently: Take 50 mg by mouth daily. 01/06/22   Jacqueline Matsu, MD  omeprazole (PRILOSEC) 20 MG capsule Take 20 mg by mouth every morning. 05/29/20   [provider]  potassium chloride  SA (KLOR-CON  M) 20 MEQ tablet Take 2 tablets (40 mEq total) by mouth 2 (two) times daily. 08/26/21   Darlis Eisenmenger, MD  prazosin  (MINIPRESS ) 2 MG capsule TAKE TWO CAPSULES BY MOUTH EVERYDAY AT BEDTIME Patient taking differently: Take 4 mg by mouth at bedtime. 07/08/21   Jacqueline Matsu, MD  prazosin  (MINIPRESS ) 2 MG capsule Take two tablets at bedtime. 03/09/22   Mozingo, Regina Nattalie, NP  pregabalin  (LYRICA ) 150 MG capsule Take 1 capsule (150 mg total) by mouth 3 (three) times daily. 01/07/22   Sater, Sherida Dimmer, MD  repaglinide  (PRANDIN ) 2 MG tablet Take 2  tablets (4 mg total) by mouth 2 (two) times daily before a meal. 07/15/21   Gwyndolyn Lerner, MD  spironolactone  (ALDACTONE ) 25 MG tablet Take 1 tablet (25 mg total) by mouth daily. 06/24/21   Jacqueline Matsu, MD  temazepam  (RESTORIL ) 15 MG capsule Take 1 capsule (15 mg total) by mouth at bedtime. 03/09/22   Mozingo, Regina Nattalie, NP  torsemide  (DEMADEX ) 20 MG tablet Take 4 tablets (80 mg total) by mouth every morning AND 3 tablets (60 mg total) every evening. 08/26/21   Darlis Eisenmenger, MD  traZODone  (DESYREL ) 100 MG tablet Take 2 tablets (200 mg total) by mouth at bedtime. Take 200 mg by mouth at  bedtime. 03/09/22   Mozingo, Regina Nattalie, NP  TRULICITY  4.5 MG/0.5ML SOPN INJECT 4.5mg  into THE SKIN ONCE A WEEK Patient taking differently: Inject 4.5 mg into the skin once a week. Monday 10/11/21   Gwyndolyn Lerner, MD  venlafaxine  XR (EFFEXOR -XR) 75 MG 24 hr capsule Take 3 capsules (225 mg total) by mouth daily with breakfast. 03/09/22   Mozingo, Regina Nattalie, NP      Allergies    Vancomycin ; Niacin; Niacin and related; Tubersol [tuberculin, ppd]; and Doxycycline     Review of Systems   Review of Systems  Constitutional:  Negative for fever.  HENT:  Negative for sore throat.   Eyes:  Negative for visual disturbance.  Respiratory:  Positive for cough and shortness of breath.   Cardiovascular:  Negative for chest pain.  Gastrointestinal:  Negative for abdominal pain.  Genitourinary:  Negative for dysuria.  Skin:  Negative for rash.  Neurological:  Positive for seizures.    Physical Exam Updated Vital Signs BP 108/71 (BP Location: Right Arm)   Pulse 87   Temp 97.7 F (36.5 C) (Oral)   Resp 15   SpO2 95%  Physical Exam Vitals and nursing note reviewed.  Constitutional:      General: He is not in acute distress.    Appearance: Normal appearance. He is well-developed.  HENT:     Head: Normocephalic and atraumatic.  Eyes:     Conjunctiva/sclera: Conjunctivae normal.  Cardiovascular:      Rate and Rhythm: Normal rate and regular rhythm.     Heart sounds: No murmur heard. Pulmonary:     Effort: Pulmonary effort is normal. No respiratory distress.     Breath sounds: Normal breath sounds.  Abdominal:     Palpations: Abdomen is soft.     Tenderness: There is no abdominal tenderness. There is no guarding or rebound.  Musculoskeletal:        General: No swelling.     Cervical back: Neck supple.     Right lower leg: Edema present.     Left lower leg: Edema present.  Skin:    General: Skin is warm and dry.     Capillary Refill: Capillary refill takes less than 2 seconds.  Neurological:     General: No focal deficit present.     Mental Status: He is alert.     Cranial Nerves: No cranial nerve deficit.     Sensory: No sensory deficit.     Motor: No weakness.     Comments: Possibly some trace weakness in his left upper extremity.     ED Results / Procedures / Treatments   Labs (all labs ordered are listed, but only abnormal results are displayed) Labs Reviewed  COMPREHENSIVE METABOLIC PANEL - Abnormal; Notable for the following components:      Result Value   Glucose, Bld 100 (*)    Calcium  8.8 (*)    All other components within normal limits  CBC WITH DIFFERENTIAL/PLATELET - Abnormal; Notable for the following components:   WBC 11.2 (*)    Neutro Abs 7.9 (*)    All other components within normal limits  URINALYSIS, ROUTINE W REFLEX MICROSCOPIC - Abnormal; Notable for the following components:   Color, Urine STRAW (*)    Glucose, UA >=500 (*)    All other components within normal limits  BRAIN NATRIURETIC PEPTIDE    EKG EKG Interpretation  Date/Time:  Wednesday March 09 2022 20:59:53 EDT Ventricular Rate:  90 PR Interval:  217 QRS Duration: 127  QT Interval:  403 QTC Calculation: 494 R Axis:   59 Text Interpretation: Sinus rhythm Borderline prolonged PR interval Nonspecific intraventricular conduction delay Borderline T abnormalities, anterior leads No  significant change since prior 5/23 Confirmed by Racheal Buddle [phone]) on 03/09/2022 9:08:46 PM  Radiology DG Chest Port 1 View  Result Date: 03/09/2022 CLINICAL DATA:  Cough. EXAM: PORTABLE CHEST 1 VIEW COMPARISON:  August 11, 2021 FINDINGS: The heart size and mediastinal contours are within normal limits. Mild, stable atelectasis and/or scarring is seen within the left lung base. There is no evidence of an acute infiltrate, pleural effusion or pneumothorax. The visualized skeletal structures are unremarkable. IMPRESSION: No acute or active cardiopulmonary disease. Electronically Signed   By: Virgle Grime M.D.   On: 03/09/2022 21:35    Procedures Procedures    Medications Ordered in ED Medications - No data to display  ED Course/ Medical Decision Making/ A&P Clinical Course as of 03/10/22 1346  Wed Mar 09, 2022  2132 Discussed with the neurology Dr. Cherisse Cornell.  He recommended routine work-up and if appropriate for discharge make sure he gets another EKG in a couple weeks to follow-up PR interval now that he is starting newly on Vimpat . [MB]    Clinical Course User Index [MB] Tonya Fredrickson, MD                           Medical Decision Making Amount and/or Complexity of Data Reviewed Labs: ordered. Radiology: ordered.  This patient complains of right-sided seizure activity; this involves an extensive number of treatment Options and is a complaint that carries with it a high risk of complications and morbidity. The differential includes seizure, subtherapeutic, medication noncompliance, metabolic derangement, infection  I ordered, reviewed and interpreted labs, which included CBC with mildly elevated white count normal hemoglobin, chemistries fairly unremarkable, LFTs normal, urinalysis without signs of infection  I ordered imaging studies which included chest x-ray and I independently    visualized and interpreted imaging which showed no acute findings Additional history  obtained from EMS Previous records obtained and reviewed in epic no recent admissions I consulted neurology Dr. Murvin Arthurs and discussed lab and imaging findings and discussed disposition.  Cardiac monitoring reviewed, normal sinus rhythm Social determinants considered, no significant barriers Critical Interventions: None  After the interventions stated above, I reevaluated the patient and found to be well-appearing in no distress Admission and further testing considered, no indications for admission or further work-up at this time.  Patient will follow-up with outpatient his neurologist.  Continue seizure medications.  Return instructions discussed          Final Clinical Impression(s) / ED Diagnoses Final diagnoses:  Seizure [facility])    Rx / DC Orders ED Discharge Orders     None         Tonya Fredrickson, MD 03/10/22 1348

## 2022-03-09 NOTE — Discharge Instructions (Signed)
You were seen in the emergency department for right-sided seizure.  Your lab work did not show any significant abnormalities.  Your seizure resolved on its own.  Neurology is recommending that you continue your Vimpat and your Keppra and follow-up outpatient with neurology.  Return to the emergency department if any worsening or concerning symptoms

## 2022-03-10 DIAGNOSIS — R0902 Hypoxemia: Secondary | ICD-10-CM | POA: Diagnosis not present

## 2022-03-10 DIAGNOSIS — M2550 Pain in unspecified joint: Secondary | ICD-10-CM | POA: Diagnosis not present

## 2022-03-10 DIAGNOSIS — Z7401 Bed confinement status: Secondary | ICD-10-CM | POA: Diagnosis not present

## 2022-03-14 ENCOUNTER — Telehealth: Payer: Self-pay

## 2022-03-14 NOTE — Telephone Encounter (Signed)
Called office back and left detail vm

## 2022-03-14 NOTE — Telephone Encounter (Signed)
Pharm D- Wonda Amis for [name] called to see if we could stop Bromocriptine. Level D drug interaction and medication is also known to cause Epilepsy seizures and patient is already on two seizure medications. Please advise.  MD Line [phone]

## 2022-03-15 DIAGNOSIS — E785 Hyperlipidemia, unspecified: Secondary | ICD-10-CM | POA: Diagnosis not present

## 2022-03-15 DIAGNOSIS — I10 Essential (primary) hypertension: Secondary | ICD-10-CM | POA: Diagnosis not present

## 2022-03-15 DIAGNOSIS — E1165 Type 2 diabetes mellitus with hyperglycemia: Secondary | ICD-10-CM | POA: Diagnosis not present

## 2022-03-22 DIAGNOSIS — E1165 Type 2 diabetes mellitus with hyperglycemia: Secondary | ICD-10-CM | POA: Diagnosis not present

## 2022-03-23 ENCOUNTER — Encounter: Payer: Self-pay | Admitting: Endocrinology

## 2022-03-23 ENCOUNTER — Ambulatory Visit (INDEPENDENT_AMBULATORY_CARE_PROVIDER_SITE_OTHER): Payer: No Typology Code available for payment source | Admitting: Endocrinology

## 2022-03-23 VITALS — BP 118/86 | HR 95 | Ht 74.0 in | Wt 373.0 lb

## 2022-03-23 DIAGNOSIS — E1165 Type 2 diabetes mellitus with hyperglycemia: Secondary | ICD-10-CM

## 2022-03-23 DIAGNOSIS — E1159 Type 2 diabetes mellitus with other circulatory complications: Secondary | ICD-10-CM | POA: Diagnosis not present

## 2022-03-23 DIAGNOSIS — Z794 Long term (current) use of insulin: Secondary | ICD-10-CM | POA: Diagnosis not present

## 2022-03-23 LAB — POCT GLYCOSYLATED HEMOGLOBIN (HGB A1C): Hemoglobin A1C: 7.5 % — AB (ref 4.0–5.6)

## 2022-03-23 LAB — MICROALBUMIN / CREATININE URINE RATIO
Creatinine,U: 53.6 mg/dL
Microalb Creat Ratio: 1.3 mg/g (ref 0.0–30.0)
Microalb, Ur: <0.7 mg/dL (ref 0.0–1.9)

## 2022-03-23 MED ORDER — DEXCOM G7 SENSOR MISC: 1.0000 | 3 refills | Status: DC

## 2022-03-23 MED ORDER — DEXCOM G7 RECEIVER DEVI: 0 refills | Status: DC

## 2022-03-23 NOTE — Patient Instructions (Addendum)
[facility] sensor  [place] '5mg'$  weekly x 4 , '10mg'$  x4 weeks then 15 mg  Check blood sugars on waking up 3 days a week  Also check blood sugars about 2 hours after meals and do this after different meals by rotation  Recommended blood sugar levels on waking up are 90-130 and about 2 hours after meal is 130-180  Please bring your blood sugar monitor to each visit, thank you

## 2022-03-23 NOTE — Progress Notes (Signed)
Patient ID: Lawrence [name], male   DOB: 08/16/1955, 66 y.o.   MRN: [id number]           Reason for Appointment: Type II Diabetes follow-up   History of Present Illness   Diagnosis date: 2018  Previous history: A1c in the past has ranged from 7.1-9.4  He has been on progressively increasing number of medications for his diabetes over the last 5 years or so Insulin was apparently used with small doses of insulin for 2 months in 2022 Usually A1c has not been below 7   Recent history:     Non-insulin hypoglycemic drugs: Farxiga 10 mg daily, metformin ER 2 g daily, Prandin 4 mg twice daily, Trulicity 4.5 mg weekly, started in 6/21     Insulin regimen: Tresiba 36 units daily    Side effects from medications: None  Current self management, blood sugar patterns and problems identified:  His A1c is 7.5 compared to 8%  He was started on insulin on his last visit which she had previously stopped because of higher A1c and mostly high fasting readings  He has used flowsheet for titration as discussed on last visit and now for the last few days has continued on 36 units  However at least in the last 6 days his morning sugars are consistently above his target of 130-140  Also has been advised to check blood sugars after meals but continues to forget  He was contacted by the DME supplier about his freestyle libre but unclear why he has not received the product  Also his blood sugars are fluctuating with glucose readings as high as 306 but also as low as 106 He thinks that blood sugars are higher in the morning when he goes off his diet the night before with high carbohydrate or high fat foods or sweet drinks like lemonade  Not able to exercise AVERAGE blood sugar fasting is still higher than target Most recent creatinine this month has been normal with continuing [place] Again not able to lose weight, recently he thinks he has been inconsistent with diet on his birthday week  Exercise:  Unable to, in wheelchair      Monitors blood glucose: Once a day.    Glucometer:   Accu-Chek        Blood Glucose readings from meter review   PRE-MEAL Fasting Lunch Dinner Bedtime Overall  Glucose range: 106-306      Mean/median:     165   POST-MEAL PC Breakfast PC Lunch PC Dinner  Glucose range:     Mean/median:      Previously  PRE-MEAL Fasting Lunch Dinner Bedtime Overall  Glucose range: 155-209      Mean/median:     159   POST-MEAL PC Breakfast PC Lunch PC Dinner  Glucose range:   ?  Mean/median:        Hypoglycemia:  none                   Dietician visit: Most recent: Years ago Weight control:  Wt Readings from Last 3 Encounters:  03/23/22 (!) 373 lb (169.2 kg)  02/25/22 (!) 373 lb 10.9 oz (169.5 kg)  02/25/22 (!) 369 lb (167.4 kg)          Complications:   Vascular disease, neuropathy  Diabetes labs:  Lab Results  Component Value Date   HGBA1C 7.5 (A) 03/23/2022   HGBA1C 8.0 (A) 10/14/2021   HGBA1C 7.1 (A) 07/15/2021   Lab Results  Component  Value Date   MICROALBUR <0.7 03/23/2022   LDLCALC 60 03/03/2022   CREATININE 1.00 03/09/2022     Allergies as of 03/23/2022       Reactions   Vancomycin Other (See Comments)   "Red Man Syndrome" 02/02/17: possible cause for rash under both arms   Niacin Other (See Comments)   Niacin And Related Other (See Comments)   Red man syndrome   Tubersol [tuberculin, Ppd] Other (See Comments)   Reaction unknown   Doxycycline Rash, Other (See Comments)        Medication List        Accurate as of March 23, 2022  4:26 PM. If you have any questions, ask your nurse or doctor.          Accu-Chek FastClix Lancets Misc 1 each by Other route in the morning and at bedtime.   acetaminophen 325 MG tablet Commonly known as: TYLENOL Take 2 tablets (650 mg total) by mouth every 4 (four) hours as needed for mild pain (or temp > 37.5 C (99.5 F)).   albuterol 108 (90 Base) MCG/ACT inhaler Commonly known as:  VENTOLIN HFA Inhale 2 puffs into the lungs every 6 (six) hours as needed for wheezing or shortness of breath.   Alcohol Prep 70 % Pads 1 each by Other route in the morning, at noon, and at bedtime.   allopurinol 100 MG tablet Commonly known as: ZYLOPRIM Take 200 mg by mouth daily.   ARIPiprazole 5 MG tablet Commonly known as: ABILIFY TAKE ONE TABLET BY MOUTH EVERYDAY AT BEDTIME   aspirin EC 81 MG tablet Take 1 tablet (81 mg total) by mouth daily. Swallow whole.   atorvastatin 40 MG tablet Commonly known as: LIPITOR Take 1 tablet (40 mg total) by mouth every evening. TAKE ONE TABLET BY MOUTH DAILY AT 6PM   bromocriptine 2.5 MG tablet Commonly known as: PARLODEL Take 1 tablet (2.5 mg total) by mouth at bedtime.   busPIRone 10 MG tablet Commonly known as: BUSPAR Take 1 tablet (10 mg total) by mouth 3 (three) times daily.   cetirizine 10 MG tablet Commonly known as: ZYRTEC Take 10 mg by mouth at bedtime.   Dexcom G7 Receiver [place] Mo To display CGM data Started by: Elayne Snare, MD   Dexcom G7 Sensor Misc 1 Device by Does not apply route as directed. Change sensor every 10 days Started by: Elayne Snare, MD   diltiazem 180 MG 24 hr capsule Commonly known as: CARDIZEM CD TAKE ONE CAPSULE BY MOUTH ONCE DAILY   Eliquis 5 MG Tabs tablet Generic drug: apixaban TAKE ONE TABLET BY MOUTH TWICE DAILY What changed: how much to take   Farxiga 10 MG Tabs tablet Generic drug: dapagliflozin propanediol TAKE ONE TABLET BY MOUTH BEFORE BREAKFAST What changed: See the new instructions.   ferrous sulfate 324 (65 Fe) MG Tbec Take 324 mg by mouth daily with breakfast.   fluticasone 50 MCG/ACT nasal spray Commonly known as: FLONASE Place 2 sprays into both nostrils daily.   icosapent Ethyl 1 g capsule Commonly known as: Vascepa Take 2 capsules (2 g total) by mouth 2 (two) times daily.   isosorbide mononitrate 30 MG 24 hr tablet Commonly known as: IMDUR TAKE ONE TABLET BY MOUTH EVERY  EVENING What changed: when to take this   lacosamide 50 MG Tabs tablet Commonly known as: VIMPAT Take 1 tablet (50 mg total) by mouth 2 (two) times daily. Start with '50mg'$  BID and increase by '50mg'$  every week until at  desired dose '200mg'$  BID.   levETIRAcetam 500 MG tablet Commonly known as: KEPPRA Take 1 tablet (500 mg total) by mouth 2 (two) times daily.   metFORMIN 500 MG 24 hr tablet Commonly known as: GLUCOPHAGE-XR Take 4 tablets (2,000 mg total) by mouth daily. What changed:  how much to take when to take this   metoprolol succinate 50 MG 24 hr tablet Commonly known as: TOPROL-XL Take 1 tablet by mouth daily. Take with or immediately following a meal. What changed: additional instructions   omeprazole 20 MG capsule Commonly known as: PRILOSEC Take 20 mg by mouth every morning.   potassium chloride SA 20 MEQ tablet Commonly known as: KLOR-CON M Take 2 tablets (40 mEq total) by mouth 2 (two) times daily.   prazosin 2 MG capsule Commonly known as: MINIPRESS TAKE TWO CAPSULES BY MOUTH EVERYDAY AT BEDTIME What changed: See the new instructions.   prazosin 2 MG capsule Commonly known as: MINIPRESS Take two tablets at bedtime. What changed: Another medication with the same name was changed. Make sure you understand how and when to take each.   pregabalin 150 MG capsule Commonly known as: LYRICA Take 1 capsule (150 mg total) by mouth 3 (three) times daily.   repaglinide 2 MG tablet Commonly known as: PRANDIN Take 2 tablets (4 mg total) by mouth 2 (two) times daily before a meal.   spironolactone 25 MG tablet Commonly known as: ALDACTONE Take 1 tablet (25 mg total) by mouth daily.   temazepam 15 MG capsule Commonly known as: RESTORIL Take 1 capsule (15 mg total) by mouth at bedtime.   torsemide 20 MG tablet Commonly known as: Demadex Take 4 tablets (80 mg total) by mouth every morning AND 3 tablets (60 mg total) every evening.   traZODone 100 MG tablet Commonly  known as: DESYREL Take 2 tablets (200 mg total) by mouth at bedtime. Take 200 mg by mouth at bedtime.   Trelegy Ellipta 200-62.5-25 MCG/ACT Aepb Generic drug: Fluticasone-Umeclidin-Vilant Inhale 1 puff into the lungs daily.   Tyler Aas FlexTouch 100 UNIT/ML FlexTouch Pen Generic drug: insulin degludec Max daily dose 30 Units What changed:  how much to take how to take this when to take this additional instructions   Trulicity 4.5 YJ/8.5UD Sopn Generic drug: Dulaglutide INJECT 4.'5mg'$  into THE SKIN ONCE A WEEK What changed: See the new instructions.   venlafaxine XR 75 MG 24 hr capsule Commonly known as: EFFEXOR-XR Take 3 capsules (225 mg total) by mouth daily with breakfast.   VITAMIN D3 PO Take 2,000 Units by mouth daily.        Allergies:  Allergies  Allergen Reactions   Vancomycin Other (See Comments)    "Red Man Syndrome" 02/02/17: possible cause for rash under both arms   Niacin Other (See Comments)   Niacin And Related Other (See Comments)    Red man syndrome   Tubersol [Tuberculin, Ppd] Other (See Comments)    Reaction unknown   Doxycycline Rash and Other (See Comments)    Past Medical History:  Diagnosis Date   Acquired dilation of ascending aorta and aortic root (HCC)    47m by echo 01/2021   Adenomatous colon polyp 2007   Anemia    Anxiety    Aortic stenosis    moderate AS by echo 01/2021   Asthma    BPH without obstruction/lower urinary tract symptoms 02/22/2017   Chronic diastolic (congestive) heart failure (HCC)    Chronic venous stasis 03/07/2019   COPD (chronic obstructive  pulmonary disease) ([place])    Coronary artery calcification seen on CAT scan    Depression    Diabetic neuropathy ([place]) 09/11/2019   History of colon polyps 08/24/2018   Hypertension    Morbid obesity (HCC)    OSA (obstructive sleep apnea)    Pain due to onychomycosis of toenails of both feet 09/11/2019   Peripheral neuropathy 02/22/2017   Primary osteoarthritis, left  shoulder 03/05/2017   PTSD (post-traumatic stress disorder)    Pure hypercholesterolemia    QT prolongation 03/07/2019   Seizures (HCC)    Shortness of breath    Sinus tachycardia 03/07/2019   Sleep apnea    CPAP   Type 2 diabetes mellitus with vascular disease ([place]) 09/11/2019    Past Surgical History:  Procedure Laterality Date   ENDOVENOUS ABLATION SAPHENOUS VEIN W/ LASER Right 08/20/2020   endovenous laser ablation right greater saphenous vein by Gae Gallop MD    JOINT REPLACEMENT     left knee replacement x 2   KNEE ARTHROSCOPY Bilateral    LEFT HEART CATH AND CORONARY ANGIOGRAPHY N/A 01/17/2018   Procedure: LEFT HEART CATH AND CORONARY ANGIOGRAPHY;  Surgeon: Leonie Man, MD;  Location: [facility];  Service: Cardiovascular;  Laterality: N/A;   PRESSURE SENSOR/CARDIOMEMS N/A 08/26/2021   Procedure: PRESSURE SENSOR/CARDIOMEMS;  Surgeon: Larey Dresser, MD;  Location: [facility];  Service: Cardiovascular;  Laterality: N/A;   RIGHT HEART CATH N/A 08/26/2021   Procedure: RIGHT HEART CATH;  Surgeon: Larey Dresser, MD;  Location: [facility];  Service: Cardiovascular;  Laterality: N/A;   TEE WITHOUT CARDIOVERSION N/A 08/26/2021   Procedure: TRANSESOPHAGEAL ECHOCARDIOGRAM (TEE);  Surgeon: Larey Dresser, MD;  Location: [facility];  Service: Cardiovascular;  Laterality: N/A;   UMBILICAL HERNIA REPAIR      Family History  Problem Relation Age of Onset   CAD Maternal Grandfather    Diabetes Other    Diabetes Mellitus II Neg Hx    Colon cancer Neg Hx    Esophageal cancer Neg Hx    Inflammatory bowel disease Neg Hx    Liver disease Neg Hx    Pancreatic cancer Neg Hx    Rectal cancer Neg Hx    Stomach cancer Neg Hx    Sleep apnea Neg Hx     Social History:  reports that he quit smoking about 6 years ago. His smoking use included cigarettes. He has a 44.00 pack-year smoking history. He has been exposed to tobacco smoke. He has never used  smokeless tobacco. He reports current alcohol use. He reports that he does not use drugs.  Review of Systems:  Hypertension: Currently on metoprolol, diltiazem, spironolactone and prazosin  BP Readings from Last 3 Encounters:  03/23/22 118/86  03/10/22 103/72  02/26/22 108/67    Lipids: Managed by cardiology, recent LDL excellent on atorvastatin 40 mg    Lab Results  Component Value Date   CHOL 142 03/03/2022   CHOL 163 12/23/2021   CHOL 148 04/28/2021   Lab Results  Component Value Date   HDL 30 (L) 03/03/2022   HDL 35 (L) 12/23/2021   HDL 30 (L) 04/28/2021   Lab Results  Component Value Date   LDLCALC 60 03/03/2022   LDLCALC 62 12/23/2021   LDLCALC 69 04/28/2021   Lab Results  Component Value Date   TRIG 261 (H) 03/03/2022   TRIG 330 (H) 12/23/2021   TRIG 246 (H) 04/28/2021   Lab Results  Component  Value Date   CHOLHDL 4.7 03/03/2022   CHOLHDL 4.7 12/23/2021   CHOLHDL 4.9 04/28/2021   No results found for: "LDLDIRECT"  Eye exams: Reportedly no retinopathy and up-to-date  He has numbness in his feet which is variable from neuropathy  He is being treated with CPAP for his OSA   Examination:   BP 118/86 (BP Location: Left Arm, Patient Position: Sitting, Cuff Size: Normal)   Pulse 95   Ht '6\' 2"'$  (1.88 m)   Wt (!) 373 lb (169.2 kg)   SpO2 91%   BMI 47.89 kg/m   Body mass index is 47.89 kg/m.    ASSESSMENT/ PLAN:    Diabetes type 2 with severe obesity:   He is on a 4 drug regimen including Trulicity and Farxiga, currently on basal insulin  Blood glucose control is slightly better but still starting to improve with adding basal insulin Today  A1c 1.5 compared to 8% Recent fasting blood sugars averaging about 160 and no postprandial readings available However has highly variable fasting readings which can be as low as 106 but occasionally as high as 305 based on his diet  No recent dysfunction  Recommendations:  Start monitoring blood sugars  after meals about 2 hours after various meals especially dinner He will need to call us if his blood sugars are consistently over 200 postprandially Discussed possibility of using mealtime insulin for covering postprandial spikes and showed him a graph of how this would work Continue titrating [place] to get morning sugars below 140 at least Discussed possibility of better efficacy with Mounjaro compared to Trulicity and he agrees to try this as it is likely covered by his insurance  Prescription will be sent for 5 mg dosage to be used for 4 weeks and then he will call us for a 10 mg prescription if tolerated  MONITORING: We will try to get the Dexcom sensor approved through the mail order pharmacy and he will also try to check from the [facility] Possible side effects and slightly more effective action on weight loss and satiety Needs to target fasting at 130 Make sure he is reviewing what kind of foods make his blood sugar go up and try to modify diet Likely may benefit from consultation with dietitian However he thinks he can try to improve his diet now  Needs urine microalbumin to be checked today  Patient Instructions  [facility] libre sensor  M[place]jaro '5mg'$  weekly x 4 , '10mg'$  x4 weeks then 15 mg  Check blood sugars on waking up 3 days a week  Also check blood sugars about 2 hours after meals and do this after different meals by rotation  Recommended blood sugar levels on waking up are 90-130 and about 2 hours after meal is 130-180  Please bring your blood sugar monitor to each visit, thank you  Total visit time for evaluation and management and counseling = 30 minutes  Elayne Snare 03/23/2022, 4:26 PM

## 2022-03-24 ENCOUNTER — Encounter: Payer: Self-pay | Admitting: Neurology

## 2022-03-24 ENCOUNTER — Ambulatory Visit (INDEPENDENT_AMBULATORY_CARE_PROVIDER_SITE_OTHER): Payer: Medicare HMO | Admitting: Neurology

## 2022-03-24 VITALS — BP 110/66 | HR 95

## 2022-03-24 DIAGNOSIS — G40909 Epilepsy, unspecified, not intractable, without status epilepticus: Secondary | ICD-10-CM | POA: Diagnosis not present

## 2022-03-24 DIAGNOSIS — Z6841 Body Mass Index (BMI) 40.0 and over, adult: Secondary | ICD-10-CM

## 2022-03-24 DIAGNOSIS — E1142 Type 2 diabetes mellitus with diabetic polyneuropathy: Secondary | ICD-10-CM | POA: Diagnosis not present

## 2022-03-24 DIAGNOSIS — G4733 Obstructive sleep apnea (adult) (pediatric): Secondary | ICD-10-CM

## 2022-03-24 DIAGNOSIS — Z9989 Dependence on other enabling machines and devices: Secondary | ICD-10-CM | POA: Diagnosis not present

## 2022-03-24 MED ORDER — VALTOCO 15 MG DOSE 7.5 MG/0.1ML NA LQPK: 15.0000 mg | NASAL | 5 refills | Status: DC | PRN

## 2022-03-24 NOTE — Progress Notes (Signed)
[facility]  PATIENT: Lawrence [name] DOB: 1956-01-26  REFERRING CLINICIAN: [name], PA-C HISTORY FROM: Patient and sister Lawrence [name] FOR VISIT: [facility] follow up   HISTORICAL  CHIEF COMPLAINT:  Chief Complaint  Patient presents with   Follow-up    Room 17 w/ significant other, Lawrence [name]. Follow up for seizures. Last night, he had an episode of right shaking for around 20 minutes. It stopped for about 5 minutes then started again, lasting 10 minutes the second time.    INTERVAL HISTORY 03/24/2022 Patient presents today with partner Lawrence [name]. Since last visit in February, he has been doing well in term of seizures, on Keppra 500 mg BID until August 2nd when he had a breakthrough seizure. He was seen in the ED and Vimpat 150 mg BID. He reports increase sleepiness while on Vimpat. He reports another breakthough seizure last night but states that yesterday was a very busy and stressful day. Interval for sleep apnea, patient was seen and diagnosed with sleep apnea on CPAP but stated she had trouble using the CPAP machine at night and therefore has not been using it for quite some time. He also have a spinal cord stimulator for his peripheral neuropathy, reports that it is working very well for him.   INTERVAL HISTORY 09/10/2021:  Patient presents today for follow-up with Lawrence [name], last visit was in September at that time he did stop his Keppra and did not have any jerking motion of the right arm therefore plan was to observe patient off [place].  Since then he has been doing fine until December 31 when he presented to the hospital due to altered mental status.  He was admitted for Toxic metabolic encephalopathy, severe sepsis in the setting of pneumonia and acute hypoxic respiratory failure.  During this time he was noted to have right arm jerking and period of unresponsiveness.  He had a routine EEG which did not show any epileptiform discharge and no  seizures.  Plan was to restart the patient on Keppra but 1000 mg twice daily.  Since discharge from the hospital patient has been reported increased daytime somnolence.  He reported he is very difficult for him to for him to stay up, he felt like a zombie and also have issue with his memory.  He still dealing with peripheral neuropathy and is on Lyrica 150 mg 3 times daily and also has severe sleep apnea.  No seizure-like activity since being home.    HISTORY OF PRESENT ILLNESS:  This is a 66 year old man with past medical history of obesity, obstructive sleep apnea, diastolic heart failure, hypertension, hyperlipidemia, diabetes who is presenting with seizure-like activity.  Patient stated that he was admitted in March of this year for pulmonary edema, acute hypoxic respiratory failure, and left lower extremity cellulitis.  During his admission he was noted to have seizure-like activity.  Wife who is present today mentioned that patient started having tremor like activity in the upper extremity lasted about 30 seconds.  He only had 1 episode.  He was evaluated for seizure-like activity.  He had MRI which was negative and EEG also which was negative.  He was started on Keppra 1000 mg twice daily.  Patient stated after discharge from the hospital he took the medication for 1 month and self discontinued.  Since leaving the hospital he has not had any seizure-like activity.  Denies any previous seizure.  Denies any family history of seizures, denies any seizures factor.  His MRI  was negative for any acute stroke or chronic stroke .   Patient also report continued lower extremity pain and numbness.  He has a history of diabetic neuropathy and he is on pregabalin 100 mg 3 times daily.  His hemoglobin A1c is still elevated, last hemoglobin A1c was 8.  He is on both insulin and oral meds for his diabetes. Other issues discussed today he is asleep.  He had he does have a diagnosis of obstructive sleep apnea he is on  a CPAP machine, reported he take he uses CPAP machine but still has been getting restful sleep..  The patient reported last time he has a steep CPAP machine checked was about 3 years ago.  He has not seen a sleep doctor since   Handedness: Right handed   Seizure Type: Unclear,   Current frequency: Only once   Any injuries from seizures:   Seizure risk factors: No seizure risk factor  Previous ASMs: Levetiracetam   Currenty ASMs: Levetiracetam   ASMs side effects: None   Brain Images: Normal MRI   Previous EEGs: Normal EEG   OTHER MEDICAL CONDITIONS: Obesity, OSA, HTN, DM, HLD, COPD, Diabetic neuropathy, Diastolic heart failure   REVIEW OF SYSTEMS: Full 14 system review of systems performed and negative with exception of: as noted in the HPI  ALLERGIES: Allergies  Allergen Reactions   Vancomycin Other (See Comments)    "Red Man Syndrome" 02/02/17: possible cause for rash under both arms   Niacin Other (See Comments)   Niacin And Related Other (See Comments)    Red man syndrome   Tubersol [Tuberculin, Ppd] Other (See Comments)    Reaction unknown   Doxycycline Rash and Other (See Comments)    HOME MEDICATIONS: Outpatient Medications Prior to Visit  Medication Sig Dispense Refill   Accu-Chek FastClix Lancets MISC 1 each by Other route in the morning and at bedtime.     acetaminophen (TYLENOL) 325 MG tablet Take 2 tablets (650 mg total) by mouth every 4 (four) hours as needed for mild pain (or temp > 37.5 C (99.5 F)).     albuterol (VENTOLIN HFA) 108 (90 Base) MCG/ACT inhaler Inhale 2 puffs into the lungs every 6 (six) hours as needed for wheezing or shortness of breath. 1 each 6   Alcohol Swabs (ALCOHOL PREP) 70 % PADS 1 each by Other route in the morning, at noon, and at bedtime.     allopurinol (ZYLOPRIM) 100 MG tablet Take 200 mg by mouth daily.     ARIPiprazole (ABILIFY) 5 MG tablet TAKE ONE TABLET BY MOUTH EVERYDAY AT BEDTIME 90 tablet 3   aspirin EC 81 MG tablet  Take 1 tablet (81 mg total) by mouth daily. Swallow whole. 30 tablet 12   atorvastatin (LIPITOR) 40 MG tablet Take 1 tablet (40 mg total) by mouth every evening. TAKE ONE TABLET BY MOUTH DAILY AT 6PM 90 tablet 3   bromocriptine (PARLODEL) 2.5 MG tablet Take 1 tablet (2.5 mg total) by mouth at bedtime. 90 tablet 3   busPIRone (BUSPAR) 10 MG tablet Take 1 tablet (10 mg total) by mouth 3 (three) times daily. 270 tablet 3   cetirizine (ZYRTEC) 10 MG tablet Take 10 mg by mouth at bedtime.      Cholecalciferol (VITAMIN D3 PO) Take 2,000 Units by mouth daily.     Continuous Blood Gluc Receiver (DEXCOM G7 RECEIVER) DEVI To display CGM data 1 each 0   Continuous Blood Gluc Sensor (DEXCOM G7 SENSOR) MISC 1 Device  by Does not apply route as directed. Change sensor every 10 days 3 each 3   diltiazem (CARDIZEM CD) 180 MG 24 hr capsule TAKE ONE CAPSULE BY MOUTH ONCE DAILY 90 capsule 1   ELIQUIS 5 MG TABS tablet TAKE ONE TABLET BY MOUTH TWICE DAILY 180 tablet 2   FARXIGA 10 MG TABS tablet TAKE ONE TABLET BY MOUTH BEFORE BREAKFAST 90 tablet 3   ferrous sulfate 324 (65 Fe) MG TBEC Take 324 mg by mouth daily with breakfast.      fluticasone (FLONASE) 50 MCG/ACT nasal spray Place 2 sprays into both nostrils daily.      Fluticasone-Umeclidin-Vilant (TRELEGY ELLIPTA) 200-62.5-25 MCG/ACT AEPB Inhale 1 puff into the lungs daily. 60 each 11   icosapent Ethyl (VASCEPA) 1 g capsule Take 2 capsules (2 g total) by mouth 2 (two) times daily. 120 capsule 5   insulin degludec (TRESIBA FLEXTOUCH) 100 UNIT/ML FlexTouch Pen Max daily dose 30 Units (Patient taking differently: Inject 36 Units into the skin daily.) 15 mL 1   isosorbide mononitrate (IMDUR) 30 MG 24 hr tablet TAKE ONE TABLET BY MOUTH EVERY EVENING 90 tablet 3   lacosamide (VIMPAT) 50 MG TABS tablet Take 1 tablet (50 mg total) by mouth 2 (two) times daily. Start with '50mg'$  BID and increase by '50mg'$  every week until at desired dose '200mg'$  BID. 100 tablet 0   levETIRAcetam  (KEPPRA) 500 MG tablet Take 1 tablet (500 mg total) by mouth 2 (two) times daily. 120 tablet 11   metFORMIN (GLUCOPHAGE-XR) 500 MG 24 hr tablet Take 4 tablets (2,000 mg total) by mouth daily. (Patient taking differently: Take 1,000 mg by mouth 2 (two) times daily with a meal.) 360 tablet 3   metoprolol succinate (TOPROL-XL) 50 MG 24 hr tablet Take 1 tablet by mouth daily. Take with or immediately following a meal. 90 tablet 1   omeprazole (PRILOSEC) 20 MG capsule Take 20 mg by mouth every morning.     potassium chloride SA (KLOR-CON M) 20 MEQ tablet Take 2 tablets (40 mEq total) by mouth 2 (two) times daily. 270 tablet 3   prazosin (MINIPRESS) 2 MG capsule Take two tablets at bedtime. 180 capsule 3   pregabalin (LYRICA) 150 MG capsule Take 1 capsule (150 mg total) by mouth 3 (three) times daily. 270 capsule 0   repaglinide (PRANDIN) 2 MG tablet Take 2 tablets (4 mg total) by mouth 2 (two) times daily before a meal. 360 tablet 3   spironolactone (ALDACTONE) 25 MG tablet Take 1 tablet (25 mg total) by mouth daily. 90 tablet 2   temazepam (RESTORIL) 15 MG capsule Take 1 capsule (15 mg total) by mouth at bedtime. 30 capsule 2   torsemide (DEMADEX) 20 MG tablet Take 4 tablets (80 mg total) by mouth every morning AND 3 tablets (60 mg total) every evening. 450 tablet 3   traZODone (DESYREL) 100 MG tablet Take 2 tablets (200 mg total) by mouth at bedtime. Take 200 mg by mouth at bedtime. 409 tablet 3   TRULICITY 4.5 WJ/1.9JY SOPN INJECT 4.'5mg'$  into THE SKIN ONCE A WEEK 6 mL 3   venlafaxine XR (EFFEXOR-XR) 75 MG 24 hr capsule Take 3 capsules (225 mg total) by mouth daily with breakfast. 270 capsule 3   prazosin (MINIPRESS) 2 MG capsule TAKE TWO CAPSULES BY MOUTH EVERYDAY AT BEDTIME (Patient taking differently: Take 4 mg by mouth at bedtime.) 180 capsule 3   No facility-administered medications prior to visit.    PAST MEDICAL HISTORY:  Past Medical History:  Diagnosis Date   Acquired dilation of ascending  aorta and aortic root ([place])    4m by echo 01/2021   Adenomatous colon polyp 2007   Anemia    Anxiety    Aortic stenosis    moderate AS by echo 01/2021   Asthma    BPH without obstruction/lower urinary tract symptoms 02/22/2017   Chronic diastolic (congestive) heart failure (HCC)    Chronic venous stasis 03/07/2019   COPD (chronic obstructive pulmonary disease) (HCC)    Coronary artery calcification seen on CAT scan    Depression    Diabetic neuropathy ([place] 09/11/2019   History of colon polyps 08/24/2018   Hypertension    Morbid obesity (HCC)    OSA (obstructive sleep apnea)    Pain due to onychomycosis of toenails of both feet 09/11/2019   Peripheral neuropathy 02/22/2017   Primary osteoarthritis, left shoulder 03/05/2017   PTSD (post-traumatic stress disorder)    Pure hypercholesterolemia    QT prolongation 03/07/2019   Seizures (HCC)    Shortness of breath    Sinus tachycardia 03/07/2019   Sleep apnea    CPAP   Type 2 diabetes mellitus with vascular disease ([place] 09/11/2019    PAST SURGICAL HISTORY: Past Surgical History:  Procedure Laterality Date   ENDOVENOUS ABLATION SAPHENOUS VEIN W/ LASER Right 08/20/2020   endovenous laser ablation right greater saphenous vein by CGae GallopMD    JOINT REPLACEMENT     left knee replacement x 2   KNEE ARTHROSCOPY Bilateral    LEFT HEART CATH AND CORONARY ANGIOGRAPHY N/A 01/17/2018   Procedure: LEFT HEART CATH AND CORONARY ANGIOGRAPHY;  Surgeon: HLeonie Man MD;  [facility];  Service: Cardiovascular;  Laterality: N/A;   PRESSURE SENSOR/CARDIOMEMS N/A 08/26/2021   Procedure: PRESSURE SENSOR/CARDIOMEMS;  Surgeon: MLarey Dresser MD;  [facility];  Service: Cardiovascular;  Laterality: N/A;   RIGHT HEART CATH N/A 08/26/2021   Procedure: RIGHT HEART CATH;  Surgeon: MLarey Dresser MD;  [facility];  Service: Cardiovascular;  Laterality: N/A;   TEE WITHOUT CARDIOVERSION N/A  08/26/2021   Procedure: TRANSESOPHAGEAL ECHOCARDIOGRAM (TEE);  Surgeon: MLarey Dresser MD;  [facility];  Service: Cardiovascular;  Laterality: N/A;   UMBILICAL HERNIA REPAIR      FAMILY HISTORY: Family History  Problem Relation Age of Onset   CAD Maternal Grandfather    Diabetes Other    Diabetes Mellitus II Neg Hx    Colon cancer Neg Hx    Esophageal cancer Neg Hx    Inflammatory bowel disease Neg Hx    Liver disease Neg Hx    Pancreatic cancer Neg Hx    Rectal cancer Neg Hx    Stomach cancer Neg Hx    Sleep apnea Neg Hx     SOCIAL HISTORY: Social History   Socioeconomic History   Marital status: Significant Other    Spouse name: JJone Baseman  Number of children: 1   Years of education: Not on file   Highest education level: Associate degree: academic program  Occupational History   Occupation: retired    Comment: [employer] Tobacco Use   Smoking status: Former    Packs/day: 1.00    Years: 44.00    Total pack years: 44.00    Types: Cigarettes    Quit date: 03/07/2016    Years since quitting: 6.0    Passive exposure: Past   Smokeless tobacco:  Never  Vaping Use   Vaping Use: Never used  Substance and Sexual Activity   Alcohol use: Yes    Comment: rare   Drug use: No   Sexual activity: Not on file  Other Topics Concern   Not on file  Social History Narrative   Lives with wife Lawrence [name]   Right Handed   Drinks 1-2 cups caffeine   Social Determinants of Health   Financial Resource Strain: Medium Risk (06/08/2021)   Overall Financial Resource Strain (CARDIA)    Difficulty of Paying Living Expenses: Somewhat hard  Food Insecurity: Food Insecurity Present (06/08/2021)   Hunger Vital Sign    Worried About Running Out of Food in the Last Year: Sometimes true    Ran Out of Food in the Last Year: Never true  Transportation Needs: Unmet Transportation Needs (06/08/2021)   PRAPARE - Hydrologist (Medical): Yes    Lack  of Transportation (Non-Medical): No  Physical Activity: Not on file  Stress: Not on file  Social Connections: Not on file  Intimate Partner Violence: Not on file     PHYSICAL EXAM  GENERAL EXAM/CONSTITUTIONAL: Vitals:  Vitals:   03/24/22 0802  BP: 110/66  Pulse: 95   There is no height or weight on file to calculate BMI. Wt Readings from Last 3 Encounters:  03/23/22 (!) 373 lb (169.2 kg)  02/25/22 (!) 373 lb 10.9 oz (169.5 kg)  02/25/22 (!) 369 lb (167.4 kg)   Patient is in no distress; well developed, nourished and groomed; neck is supple. He is morbidly obese  CARDIOVASCULAR: Examination of carotid arteries is normal; no carotid bruits Regular rate and rhythm, no murmurs Examination of peripheral vascular system by observation and palpation is normal  EYES: Pupils round and reactive to light, Visual fields full to confrontation, Extraocular movements intacts,   MUSCULOSKELETAL: Gait, strength, tone, movements noted in Neurologic exam below  NEUROLOGIC: MENTAL STATUS:  awake, alert, oriented to person, place and time recent and remote memory intact normal attention and concentration language fluent, comprehension intact, naming intact fund of knowledge appropriate  CRANIAL NERVE:  2nd, 3rd, 4th, 6th - pupils equal and reactive to light, visual fields full to confrontation, extraocular muscles intact, no nystagmus 5th - facial sensation symmetric 7th - facial strength symmetric 8th - hearing intact 9th - palate elevates symmetrically, uvula midline 11th - shoulder shrug symmetric 12th - tongue protrusion midline  MOTOR:  normal bulk and tone, full strength in the BUE  SENSORY:  Decrease sensation to light touch, pinprick, and vibration up to knee bilaterally.   COORDINATION:  finger-nose-finger, fine finger movements normal   GAIT/STATION:  Deferred     DIAGNOSTIC DATA (LABS, IMAGING, TESTING) - I reviewed patient records, labs, notes, testing and  imaging myself where available.  Lab Results  Component Value Date   WBC 11.2 (H) 03/09/2022   HGB 14.8 03/09/2022   HCT 45.9 03/09/2022   MCV 89.8 03/09/2022   PLT 281 03/09/2022      Component Value Date/Time   NA 139 03/09/2022 2123   NA 136 01/07/2021 0000   K 3.6 03/09/2022 2123   CL 103 03/09/2022 2123   CO2 24 03/09/2022 2123   GLUCOSE 100 (H) 03/09/2022 2123   BUN 21 03/09/2022 2123   BUN 22 01/07/2021 0000   CREATININE 1.00 03/09/2022 2123   CALCIUM 8.8 (L) 03/09/2022 2123   PROT 6.8 03/09/2022 2123   ALBUMIN 3.6 03/09/2022 2123   AST 19  03/09/2022 2123   ALT 22 03/09/2022 2123   ALKPHOS 66 03/09/2022 2123   BILITOT 0.6 03/09/2022 2123   GFRNONAA >60 03/09/2022 2123   GFRAA 77 05/20/2020 1038   Lab Results  Component Value Date   CHOL 142 03/03/2022   HDL 30 (L) 03/03/2022   LDLCALC 60 03/03/2022   TRIG 261 (H) 03/03/2022   Lab Results  Component Value Date   HGBA1C 7.5 (A) 03/23/2022   Lab Results  Component Value Date   VITAMINB12 160 (L) 08/11/2021   Lab Results  Component Value Date   TSH 1.442 02/09/2021    MRI Brain 02/08/2021 No evidence of recent infarction, hemorrhage, or mass. No significant change since recent prior study.  EEG 01/26/2021: Normal routine EEG  EEG 04/27/2021: This study is within normal limits. No seizures or epileptiform discharges were seen throughout the recording. If suspicion for ictal-interictal activity remains a concern, a prolonged study can  be considered.   EEG 08/09/2021: This study is within normal limits. No seizures or epileptiform discharges were seen throughout the recording.   I personally reviewed brain Images and previous EEG reports.   ASSESSMENT AND PLAN  66 y.o. year old male  with morbid obesity, hypertension, hyperlipidemia, diabetes mellitus type 2, diabetes neuropathy, diastolic heart failure, seizure disorder who is presenting for seizure follow up. Currently on Keppra 500 mg BID and  Lacosamide 150 mg BID but reports side effect of somnolence and tiredness.  Patient is also taking medications that contribute to fatigue and tiredness including Lyrica and trazodone.  At the [name] I will continue patient on Keppra 500 mg twice daily and lacosamide 150 mg twice daily but I did advise him to contact me sooner if he have worsening side effects, at that time we will switch the Vimpat to Trileptal.  He voices understanding.  Follow-up in 3 months or sooner if worse.    1. Seizure disorder ([place])   2. Diabetic peripheral neuropathy ([place])   3. Morbid obesity with BMI of 45.0-49.9, adult ([place])   4. OSA on CPAP      Patient Instructions  Continue with Keppra 500 mg BID  Continue with Vimpat 150 mg BID  Prescription for Valtoco sent to pharmacy  Follow up in 3 months or sooner if worse or if unable to tolerate the medication      Per [facility] statutes, patients with seizures are not allowed to drive until they have been seizure-free for six months.  Other recommendations include using caution when using heavy equipment or power tools. Avoid working on ladders or at heights. Take showers instead of baths.  Do not swim alone.  Ensure the water temperature is not too high on the home water heater. Do not go swimming alone. Do not lock yourself in a room alone (i.e. bathroom). When caring for infants or small children, sit down when holding, feeding, or changing them to minimize risk of injury to the child in the event you have a seizure. Maintain good sleep hygiene. Avoid alcohol.  Also recommend adequate sleep, hydration, good diet and minimize stress.   During the Seizure  - First, ensure adequate ventilation and place patients on the floor on their left side  Loosen clothing around the neck and ensure the airway is patent. If the patient is clenching the teeth, do not force the mouth open with any object as this can cause severe damage - Remove all items from the  surrounding that can be hazardous. The  patient may be oblivious to what's happening and may not even know what he or she is doing. If the patient is confused and wandering, either gently guide him/her away and block access to outside areas - Reassure the individual and be comforting - Call 911. In most cases, the seizure ends before EMS arrives. However, there are cases when seizures may last over 3 to 5 minutes. Or the individual may have developed breathing difficulties or severe injuries. If a pregnant patient or a person with diabetes develops a seizure, it is prudent to call an ambulance. - Finally, if the patient does not regain full consciousness, then call EMS. Most patients will remain confused for about 45 to 90 minutes after a seizure, so you must use judgment in calling for help. - Avoid restraints but make sure the patient is in a bed with padded side rails - Place the individual in a lateral position with the neck slightly flexed; this will help the saliva drain from the mouth and prevent the tongue from falling backward - Remove all nearby furniture and other hazards from the area - Provide verbal assurance as the individual is regaining consciousness - Provide the patient with privacy if possible - Call for help and start treatment as ordered by the caregiver   After the Seizure (Postictal Stage)  After a seizure, most patients experience confusion, fatigue, muscle pain and/or a headache. Thus, one should permit the individual to sleep. For the next few days, reassurance is essential. Being calm and helping reorient the person is also of importance.  Most seizures are painless and end spontaneously. Seizures are not harmful to others but can lead to complications such as stress on the lungs, brain and the heart. Individuals with prior lung problems may develop labored breathing and respiratory distress.     No orders of the defined types were placed in this encounter.    Meds  ordered this encounter  Medications   diazePAM, 15 MG Dose, (VALTOCO 15 MG DOSE) 2 x 7.5 MG/0.1ML LQPK    Sig: Place 15 mg into the nose as needed (for seizure lasting more than 5 min).    Dispense:  2 each    Refill:  5    Return in about 3 months (around 06/24/2022).    Alric Ran, MD 03/24/2022, 11:52 AM  [facility] [address] [place], [place] [address] [phone]

## 2022-03-24 NOTE — Patient Instructions (Addendum)
Continue with Keppra 500 mg BID  Continue with Vimpat 150 mg BID  Prescription for Valtoco sent to pharmacy  Follow up in 3 months or sooner if worse or if unable to tolerate the medication

## 2022-03-26 ENCOUNTER — Encounter: Payer: Self-pay | Admitting: Endocrinology

## 2022-03-26 ENCOUNTER — Encounter: Payer: Self-pay | Admitting: Neurology

## 2022-03-28 ENCOUNTER — Encounter: Payer: Self-pay | Admitting: Neurology

## 2022-03-28 MED ORDER — LACOSAMIDE 50 MG PO TABS: 50.0000 mg | ORAL_TABLET | Freq: Two times a day (BID) | ORAL | 2 refills | Status: DC

## 2022-03-28 NOTE — Telephone Encounter (Signed)
[place] drug registry has been verified. Last refill was 03/07/2022 # 98. Pt also messaged on a separate call that he I has had mild seizures just my right arm and mild shaking.

## 2022-03-30 ENCOUNTER — Telehealth: Payer: Self-pay | Admitting: Cardiology

## 2022-03-30 NOTE — Telephone Encounter (Signed)
[facility] pharmacist called in to get Dr. Landis Gandy opinion on a potential medication change.  Pt on both Diltiazem and Metoprolol.  Would like to suggest discontinuing Diltiazem and increasing Metoprolol.  Request that call be made back to [phone] ext [phone] to speak with Melissa, LPN.

## 2022-03-30 NOTE — Progress Notes (Signed)
PCP: [name], PA-C Cardiology: Dr. Radford Pax HF Cardiology: Dr. Aundra Dubin  66 y.o. with history of paroxysmal atrial fibrillation, OSA/?OHS, morbid obesity, and chronic diastolic CHF was referred by Dr. Radford Pax for evaluation of CHF.  Patient has been admitted multiple times in the last year for CHF exacerbations.  He uses a walker around the house and a motorized scooter outside the house.   He had a negative cath in 2019 and a negative Cardiolite in 4/22.  Most recent echo in 11/22 was a difficult study but showed EF 60-65%, mild LVH, mildly dilated LV, moderate aortic stenosis (trileaflet aortic valve) with mean gradient 22 mmHg, DI 0.27.   He was admitted in 12/22 with fever/PNA as well as chest pain (ruled out for MI).  He had atrial fibrillation/RVR this admission but converted back to NSR.  He was diuresed. Additionally, he was thought to have a partial seizure and Keppra was restarted.   Cardiomems was placed. 08/2021  Admitted 12/15/21 with left facial droop. CT of head negative. Neurology felt this was secondary to breakthrough seizures. Continue on keppra.   Today he returns for HF follow up.Overall feeling fine. Denies SOB/PND/Orthopnea. Uses motorized scooter outside of the home. Limited by back pain. No bleeding issues. Using Bipap nightly. Appetite ok. No fever or chills. Weight at home 341 pounds. Taking all medications.  Cardiomems Reading  = 16 Goal =18   Labs (11/22): K 3.7, creatinine 0.89 Labs (1/23): K 3.7, creatinine 0.93 => 0.84, BNP 18 => 37 Labs (03/09/22): K 3.6 Creatinine 1  PMH: 1. Type 2 diabetes 2. Atrial fibrillation: Paroxysmal.  3. HTN 4. Hyperlipidemia 5. OSA, ?OHS: Uses Bipap.  6. Obesity 7. SVT 8. Chest pain: LHC in 6/19 with minimal CAD.  - Cardiolite (4/22): No ischemia.  9. Depression/anxiety.  10. Chronic diastolic CHF: Echo in 05/39 with EF 60-65%, mild LVH, mildly dilated LV, moderate aortic stenosis (trileaflet aortic valve) with mean  gradient 22 mmHg, DI 0.27.  - RHC (1/23): RA mean 16, PA 49/24, mean PCWP 16, CI 3.13, PVR 2 WU.  - Cardomems in place.  11. Partial seizures 12. Aortic stenosis: TEE (1/23) with EF 60-65%, mild LVH, mild RVE with normal RV systolic function, mid AI, moderate AS with mean gradient 1.35 cm^2 and mean gradient 28 mmHg.   Social History   Socioeconomic History   Marital status: Significant Other    Spouse name: [name]   Number of children: 1   Years of education: Not on file   Highest education level: Associate degree: academic program  Occupational History   Occupation: retired    Comment: Artist  Tobacco Use   Smoking status: Former    Packs/day: 1.00    Years: 44.00    Total pack years: 44.00    Types: Cigarettes    Quit date: 03/07/2016    Years since quitting: 6.0    Passive exposure: Past   Smokeless tobacco: Never  Vaping Use   Vaping Use: Never used  Substance and Sexual Activity   Alcohol use: Yes    Comment: rare   Drug use: No   Sexual activity: Not on file  Other Topics Concern   Not on file  Social History Narrative   Lives with wife [name]   Right Handed   Drinks 1-2 cups caffeine   Social Determinants of Health   Financial Resource Strain: Medium Risk (06/08/2021)   Overall Financial Resource Strain (CARDIA)    Difficulty of Paying Living Expenses:  Somewhat hard  Food Insecurity: Food Insecurity Present (06/08/2021)   Hunger Vital Sign    Worried About Running Out of Food in the Last Year: Sometimes true    Ran Out of Food in the Last Year: Never true  Transportation Needs: Unmet Transportation Needs (06/08/2021)   PRAPARE - Hydrologist (Medical): Yes    Lack of Transportation (Non-Medical): No  Physical Activity: Not on file  Stress: Not on file  Social Connections: Not on file  Intimate Partner Violence: Not on file   Family History  Problem Relation Age of Onset   CAD Maternal Grandfather    Diabetes  Other    Diabetes Mellitus II Neg Hx    Colon cancer Neg Hx    Esophageal cancer Neg Hx    Inflammatory bowel disease Neg Hx    Liver disease Neg Hx    Pancreatic cancer Neg Hx    Rectal cancer Neg Hx    Stomach cancer Neg Hx    Sleep apnea Neg Hx    ROS: All systems reviewed and negative except as per HPI.   Current Outpatient Medications  Medication Sig Dispense Refill   Accu-Chek FastClix Lancets MISC 1 each by Other route in the morning and at bedtime.     acetaminophen (TYLENOL) 325 MG tablet Take 2 tablets (650 mg total) by mouth every 4 (four) hours as needed for mild pain (or temp > 37.5 C (99.5 F)).     albuterol (VENTOLIN HFA) 108 (90 Base) MCG/ACT inhaler Inhale 2 puffs into the lungs every 6 (six) hours as needed for wheezing or shortness of breath. 1 each 6   Alcohol Swabs (ALCOHOL PREP) 70 % PADS 1 each by Other route in the morning, at noon, and at bedtime.     allopurinol (ZYLOPRIM) 100 MG tablet Take 200 mg by mouth daily.     ARIPiprazole (ABILIFY) 5 MG tablet TAKE ONE TABLET BY MOUTH EVERYDAY AT BEDTIME 90 tablet 3   aspirin EC 81 MG tablet Take 1 tablet (81 mg total) by mouth daily. Swallow whole. 30 tablet 12   atorvastatin (LIPITOR) 40 MG tablet Take 1 tablet (40 mg total) by mouth every evening. TAKE ONE TABLET BY MOUTH DAILY AT 6PM 90 tablet 3   busPIRone (BUSPAR) 10 MG tablet Take 1 tablet (10 mg total) by mouth 3 (three) times daily. 270 tablet 3   cetirizine (ZYRTEC) 10 MG tablet Take 10 mg by mouth at bedtime.      Cholecalciferol (VITAMIN D3 PO) Take 2,000 Units by mouth daily.     Continuous Blood Gluc Receiver (DEXCOM G7 RECEIVER) DEVI To display CGM data 1 each 0   Continuous Blood Gluc Sensor (DEXCOM G7 SENSOR) MISC 1 Device by Does not apply route as directed. Change sensor every 10 days 3 each 3   diltiazem (CARDIZEM CD) 180 MG 24 hr capsule TAKE ONE CAPSULE BY MOUTH ONCE DAILY 90 capsule 1   ELIQUIS 5 MG TABS tablet TAKE ONE TABLET BY MOUTH TWICE DAILY  180 tablet 2   FARXIGA 10 MG TABS tablet TAKE ONE TABLET BY MOUTH BEFORE BREAKFAST 90 tablet 3   ferrous sulfate 324 (65 Fe) MG TBEC Take 324 mg by mouth daily with breakfast.      fluticasone (FLONASE) 50 MCG/ACT nasal spray Place 2 sprays into both nostrils daily.      Fluticasone-Umeclidin-Vilant (TRELEGY ELLIPTA) 200-62.5-25 MCG/ACT AEPB Inhale 1 puff into the lungs daily. 60 each 11  icosapent Ethyl (VASCEPA) 1 g capsule Take 2 capsules (2 g total) by mouth 2 (two) times daily. 120 capsule 5   insulin degludec (TRESIBA FLEXTOUCH) 100 UNIT/ML FlexTouch Pen Max daily dose 30 Units (Patient taking differently: Inject 36 Units into the skin daily.) 15 mL 1   isosorbide mononitrate (IMDUR) 30 MG 24 hr tablet TAKE ONE TABLET BY MOUTH EVERY EVENING 90 tablet 3   lacosamide (VIMPAT) 50 MG TABS tablet Take 1 tablet (50 mg total) by mouth 2 (two) times daily. Take 150 mg twice per day. 100 tablet 2   levETIRAcetam (KEPPRA) 500 MG tablet Take 1 tablet (500 mg total) by mouth 2 (two) times daily. 120 tablet 11   metFORMIN (GLUCOPHAGE-XR) 500 MG 24 hr tablet Take 4 tablets (2,000 mg total) by mouth daily. (Patient taking differently: Take 1,000 mg by mouth 2 (two) times daily with a meal.) 360 tablet 3   metoprolol succinate (TOPROL-XL) 50 MG 24 hr tablet Take 1 tablet by mouth daily. Take with or immediately following a meal. 90 tablet 1   omeprazole (PRILOSEC) 20 MG capsule Take 20 mg by mouth every morning.     potassium chloride SA (KLOR-CON M) 20 MEQ tablet Take 2 tablets (40 mEq total) by mouth 2 (two) times daily. 270 tablet 3   prazosin (MINIPRESS) 2 MG capsule Take two tablets at bedtime. 180 capsule 3   pregabalin (LYRICA) 150 MG capsule Take 1 capsule (150 mg total) by mouth 3 (three) times daily. 270 capsule 0   repaglinide (PRANDIN) 2 MG tablet Take 2 tablets (4 mg total) by mouth 2 (two) times daily before a meal. 360 tablet 3   spironolactone (ALDACTONE) 25 MG tablet Take 1 tablet (25 mg  total) by mouth daily. 90 tablet 2   temazepam (RESTORIL) 15 MG capsule Take 1 capsule (15 mg total) by mouth at bedtime. 30 capsule 2   torsemide (DEMADEX) 20 MG tablet Take 4 tablets (80 mg total) by mouth every morning AND 3 tablets (60 mg total) every evening. 450 tablet 3   traZODone (DESYREL) 100 MG tablet Take 2 tablets (200 mg total) by mouth at bedtime. Take 200 mg by mouth at bedtime. 327 tablet 3   TRULICITY 4.5 MD/4.7WL SOPN INJECT 4.'5mg'$  into THE SKIN ONCE A WEEK 6 mL 3   venlafaxine XR (EFFEXOR-XR) 75 MG 24 hr capsule Take 3 capsules (225 mg total) by mouth daily with breakfast. 270 capsule 3   diazePAM, 15 MG Dose, (VALTOCO 15 MG DOSE) 2 x 7.5 MG/0.1ML LQPK Place 15 mg into the nose as needed (for seizure lasting more than 5 min). (Patient not taking: Reported on 03/31/2022) 2 each 5   No current facility-administered medications for this encounter.   BP 106/70   Pulse 90   Wt (!) 154.7 kg (341 lb) Comment: Patient weighed today at home.  SpO2 90%   BMI 43.78 kg/m  Wt Readings from Last 3 Encounters:  03/31/22 (!) 154.7 kg (341 lb)  03/23/22 (!) 169.2 kg (373 lb)  02/25/22 (!) 169.5 kg (373 lb 10.9 oz)    General:  Well appearing. No resp difficulty. Arrived in a motorized scooter HEENT: normal Neck: supple. no JVD. Carotids 2+ bilat; no bruits. No lymphadenopathy or thryomegaly appreciated. Cor: PMI nondisplaced. Regular rate & rhythm. No rubs, gallops or murmurs. Lungs: clear Abdomen: obese, soft, nontender, nondistended. No hepatosplenomegaly. No bruits or masses. Good bowel sounds. Extremities: no cyanosis, clubbing, rash, edema Neuro: alert & orientedx3, cranial nerves  grossly intact. moves all 4 extremities w/o difficulty. Affect pleasant  Assessment/Plan: 1. Chronic diastolic CHF: Echo (45/80) with EF 60-65%, mild LVH, mildly dilated LV, moderate aortic stenosis (trileaflet aortic valve) with mean gradient 22 mmHg, DI 0.27.  Symptoms are confounded by morbid  obesity.     Cardiomems reading within range over the last week.   - NYHA II. Volume status stable.  - Continue torsemide 80 qam/60 qpm and KCl 40 bid.   - Continue dapagliflozin 10 daily.  - Continue spironolactone 25 daily. - I reveiwed BMET from 03/09/2022, stable.  2. Aortic stenosis: Moderate AS on TEE in 1/23.  3. Atrial fibrillation: Paroxysmal.  - Regular on exam.  - Continue apixaban. No bleeding issues.  - Continue Toprol XL and diltiazem CD.  4. OSA/?OHS: Using Bipap nightly. Continue Bipap.  5. Obesity:  Body mass index is 43.78 kg/m. Continues to try and lost weight.  6. Hyperlipidemia: On statin.  7. Seizures  On Keppra per Neurology     Follow up in 6 months.     Libbey Duce NP-C  03/31/2022

## 2022-03-30 NOTE — Telephone Encounter (Signed)
[facility] is calling in regards to medication patient is taking

## 2022-03-31 ENCOUNTER — Ambulatory Visit (HOSPITAL_COMMUNITY)
Admission: RE | Admit: 2022-03-31 | Discharge: 2022-03-31 | Disposition: A | Discharge status: Home / Self Care | Payer: Medicare HMO | Source: Ambulatory Visit | Attending: Adult Health | Admitting: Adult Health

## 2022-03-31 ENCOUNTER — Encounter (HOSPITAL_COMMUNITY): Payer: Self-pay

## 2022-03-31 VITALS — BP 106/70 | HR 90 | Wt 341.0 lb

## 2022-03-31 DIAGNOSIS — R569 Unspecified convulsions: Secondary | ICD-10-CM | POA: Diagnosis not present

## 2022-03-31 DIAGNOSIS — Z794 Long term (current) use of insulin: Secondary | ICD-10-CM | POA: Diagnosis not present

## 2022-03-31 DIAGNOSIS — I2583 Coronary atherosclerosis due to lipid rich plaque: Secondary | ICD-10-CM

## 2022-03-31 DIAGNOSIS — I5032 Chronic diastolic (congestive) heart failure: Secondary | ICD-10-CM | POA: Diagnosis not present

## 2022-03-31 DIAGNOSIS — I2510 Atherosclerotic heart disease of native coronary artery without angina pectoris: Secondary | ICD-10-CM | POA: Diagnosis not present

## 2022-03-31 DIAGNOSIS — I350 Nonrheumatic aortic (valve) stenosis: Secondary | ICD-10-CM | POA: Insufficient documentation

## 2022-03-31 DIAGNOSIS — Z6841 Body Mass Index (BMI) 40.0 and over, adult: Secondary | ICD-10-CM | POA: Insufficient documentation

## 2022-03-31 DIAGNOSIS — Z7984 Long term (current) use of oral hypoglycemic drugs: Secondary | ICD-10-CM | POA: Diagnosis not present

## 2022-03-31 DIAGNOSIS — Z7901 Long term (current) use of anticoagulants: Secondary | ICD-10-CM | POA: Insufficient documentation

## 2022-03-31 DIAGNOSIS — I480 Paroxysmal atrial fibrillation: Secondary | ICD-10-CM | POA: Insufficient documentation

## 2022-03-31 DIAGNOSIS — Z7985 Long-term (current) use of injectable non-insulin antidiabetic drugs: Secondary | ICD-10-CM | POA: Insufficient documentation

## 2022-03-31 DIAGNOSIS — G4733 Obstructive sleep apnea (adult) (pediatric): Secondary | ICD-10-CM | POA: Diagnosis not present

## 2022-03-31 DIAGNOSIS — E119 Type 2 diabetes mellitus without complications: Secondary | ICD-10-CM | POA: Insufficient documentation

## 2022-03-31 DIAGNOSIS — Z79899 Other long term (current) drug therapy: Secondary | ICD-10-CM | POA: Insufficient documentation

## 2022-03-31 DIAGNOSIS — E785 Hyperlipidemia, unspecified: Secondary | ICD-10-CM | POA: Insufficient documentation

## 2022-03-31 DIAGNOSIS — I110 Hypertensive heart disease with heart failure: Secondary | ICD-10-CM | POA: Insufficient documentation

## 2022-03-31 NOTE — Patient Instructions (Addendum)
Your physician recommends that you schedule a follow-up appointment in: 6 months (Feb 2024), **PLEASE CALL OUR OFFICE IN DECEMBER TO SCHEDULE THIS APPOINTMENT  If you have any questions or concerns before your next appointment please send us a message through mychart or call our office at [phone].    TO LEAVE A MESSAGE FOR THE NURSE SELECT OPTION 2, PLEASE LEAVE A MESSAGE INCLUDING: YOUR NAME DATE OF BIRTH CALL BACK NUMBER REASON FOR CALL**this is important as we prioritize the call backs  YOU WILL RECEIVE A CALL BACK THE SAME DAY AS LONG AS YOU CALL BEFORE 4:00 PM  At the [facility], you and your health needs are our priority. As part of our continuing mission to provide you with exceptional heart care, we have created designated Provider Care Teams. These Care Teams include your primary Cardiologist (physician) and Advanced Practice Providers (APPs- Physician Assistants and Nurse Practitioners) who all work together to provide you with the care you need, when you need it.   You may see any of the following providers on your designated Care Team at your next follow up: Dr Daniel Bensimhon Dr Dalton McLean Amy Clegg, NP Brittainy Simmons, PA Jessica Milford,NP Lindsay Finch, PA Lauren Kemp, PharmD   Please be sure to bring in all your medications bottles to every appointment.   

## 2022-04-04 ENCOUNTER — Telehealth: Payer: Self-pay | Admitting: Neurology

## 2022-04-04 NOTE — Telephone Encounter (Signed)
[facility] is calling about 2 sets of instructions re: pt's lacosamide (VIMPAT) 50 MG TABS tablet, they are asking for a call back re: what the actual instructions are, please call.

## 2022-04-04 NOTE — Telephone Encounter (Signed)
Instructions on office visit note states:   "Take 1 tablet (50 mg total) by mouth 2 (two) times daily. Start with '50mg'$  BID and increase by '50mg'$  every week until at desired dose '200mg'$  BID."  And "lacosamide 150 mg twice daily"  Rx from pharmacy states "Take 1 tablet (50 mg total) by mouth 2 (two) times daily. Take 150 mg twice per day."

## 2022-04-04 NOTE — Telephone Encounter (Signed)
The Lacosamide should be 150 mg twice daily.   Thank you

## 2022-04-05 MED ORDER — LACOSAMIDE 150 MG PO TABS: 150.0000 mg | ORAL_TABLET | Freq: Two times a day (BID) | ORAL | 0 refills | Status: DC

## 2022-04-05 NOTE — Telephone Encounter (Signed)
I spoke with the pharmacy. They are requesting a new clarified rx to be sent to the pharmacy.

## 2022-04-05 NOTE — Addendum Note (Signed)
Addended byAlric Ran on: 04/05/2022 12:39 PM   Modules accepted: Orders

## 2022-04-05 NOTE — Addendum Note (Signed)
Addended by: Cristela Felt E on: 04/05/2022 11:24 AM   Modules accepted: Orders

## 2022-04-07 ENCOUNTER — Other Ambulatory Visit (HOSPITAL_COMMUNITY): Payer: Self-pay | Admitting: Cardiology

## 2022-04-07 DIAGNOSIS — E782 Mixed hyperlipidemia: Secondary | ICD-10-CM

## 2022-04-09 ENCOUNTER — Other Ambulatory Visit: Payer: Self-pay | Admitting: Endocrinology

## 2022-04-12 ENCOUNTER — Other Ambulatory Visit (HOSPITAL_COMMUNITY): Payer: Self-pay | Admitting: Cardiology

## 2022-04-15 ENCOUNTER — Other Ambulatory Visit: Payer: Self-pay | Admitting: Neurology

## 2022-04-25 ENCOUNTER — Encounter: Payer: Self-pay | Admitting: Endocrinology

## 2022-04-25 DIAGNOSIS — E1159 Type 2 diabetes mellitus with other circulatory complications: Secondary | ICD-10-CM

## 2022-04-25 MED ORDER — MOUNJARO 5 MG/0.5ML ~~LOC~~ SOAJ: SUBCUTANEOUS | 0 refills | Status: DC

## 2022-04-29 ENCOUNTER — Encounter: Payer: Self-pay | Admitting: Podiatry

## 2022-04-29 ENCOUNTER — Ambulatory Visit (INDEPENDENT_AMBULATORY_CARE_PROVIDER_SITE_OTHER): Payer: Medicare HMO | Admitting: Podiatry

## 2022-04-29 DIAGNOSIS — B351 Tinea unguium: Secondary | ICD-10-CM | POA: Diagnosis not present

## 2022-04-29 DIAGNOSIS — M79674 Pain in right toe(s): Secondary | ICD-10-CM

## 2022-04-29 DIAGNOSIS — M79675 Pain in left toe(s): Secondary | ICD-10-CM

## 2022-04-29 DIAGNOSIS — I878 Other specified disorders of veins: Secondary | ICD-10-CM | POA: Diagnosis not present

## 2022-04-29 DIAGNOSIS — E1142 Type 2 diabetes mellitus with diabetic polyneuropathy: Secondary | ICD-10-CM | POA: Diagnosis not present

## 2022-04-29 NOTE — Progress Notes (Signed)
This patient returns to my office for at risk foot care.  This patient requires this care by a professional since this patient will be at risk due to having diabetic neuropathy and angiopathy and venous stasis.  . This patient presents to the office with his wife.This patient is unable to cut nails himself since the patient cannot reach his nails.These nails are painful walking and wearing shoes. This patient presents for at risk foot care today.  General Appearance  Alert, conversant and in no acute stress.  Vascular  Dorsalis pedis and posterior tibial  pulses are not  palpable  billaterally due to severe swelling feet.  Capillary return is within normal limits  bilaterally. Temperature is within normal limits  Bilaterally. Venous stasis right leg.  Neurologic  Senn-Weinstein monofilament wire test absent right foot.  Diminished left foot.. Muscle power within normal limits bilaterally.  Nails Thick disfigured discolored nails with subungual debris  from hallux to fifth toes bilaterally.  Pincer hallux nails   B/L.   Orthopedic  No limitations of motion  feet .  No crepitus or effusions noted.  No bony pathology or digital deformities noted.  Severe HAV  B/L.  Skin  normotropic skin with no porokeratosis noted bilaterally.  No signs of infections or ulcers noted.     Onychomycosis  Pain in right toes  Pain in left toes  Paronychia left hallux.  Consent was obtained for treatment procedures.   Mechanical debridement of nails 1-5  bilaterally performed with a nail nipper.  Filed with dremel without incident.  Bleeding hematoma right fifth toenail.  DSD applied.   Return office visit   3 months                   Told patient to return for periodic foot care and evaluation due to potential at risk complications.   Gardiner Barefoot DPM

## 2022-05-03 ENCOUNTER — Telehealth: Payer: Self-pay | Admitting: Adult Health

## 2022-05-03 NOTE — Telephone Encounter (Signed)
Wonda Amis a Clinical Pharmacist Tech with [facility]. Lvm to coordinate and or modify medication with Ray, your mutual patient. She can be reached by [facility] at # [phone]

## 2022-05-04 NOTE — Telephone Encounter (Signed)
Please see if there is a sooner appt available for pt

## 2022-05-04 NOTE — Telephone Encounter (Signed)
Spoke to [employer] from [place].She wanted to speak to you but I took a message.She stated Axil is experiencing some day time somnolence and she thinks some meds should be reduced.She wants to taper him off of trazodone because he is taking Restoril.She also wants to taper down the Effexor because he is on Buspar as well.She can be reached at the above number and you will have to ask to be transferred to her.

## 2022-05-04 NOTE — Telephone Encounter (Signed)
Will not make any medication changes without seeing patient to discuss. Please call and have patient make an appointment to review medications.

## 2022-05-05 NOTE — Telephone Encounter (Signed)
Lvm for pt to call and schedule

## 2022-05-06 DIAGNOSIS — E1165 Type 2 diabetes mellitus with hyperglycemia: Secondary | ICD-10-CM | POA: Diagnosis not present

## 2022-05-06 DIAGNOSIS — I10 Essential (primary) hypertension: Secondary | ICD-10-CM | POA: Diagnosis not present

## 2022-05-06 DIAGNOSIS — G4700 Insomnia, unspecified: Secondary | ICD-10-CM | POA: Diagnosis not present

## 2022-05-06 DIAGNOSIS — E785 Hyperlipidemia, unspecified: Secondary | ICD-10-CM | POA: Diagnosis not present

## 2022-05-16 ENCOUNTER — Other Ambulatory Visit: Payer: Self-pay | Admitting: Adult Health

## 2022-05-16 DIAGNOSIS — G4700 Insomnia, unspecified: Secondary | ICD-10-CM

## 2022-05-17 ENCOUNTER — Other Ambulatory Visit: Payer: Self-pay | Admitting: Endocrinology

## 2022-05-17 DIAGNOSIS — E1159 Type 2 diabetes mellitus with other circulatory complications: Secondary | ICD-10-CM

## 2022-05-17 NOTE — Telephone Encounter (Signed)
Filled 9/12 appt 11/2

## 2022-05-25 ENCOUNTER — Encounter (HOSPITAL_COMMUNITY): Payer: Self-pay

## 2022-05-25 ENCOUNTER — Observation Stay (HOSPITAL_COMMUNITY)
Admission: EM | Admit: 2022-05-25 | Discharge: 2022-05-27 | Disposition: A | Discharge status: Home Health | Payer: Medicare HMO | Attending: Internal Medicine | Admitting: Internal Medicine

## 2022-05-25 ENCOUNTER — Other Ambulatory Visit: Payer: Self-pay

## 2022-05-25 ENCOUNTER — Emergency Department (HOSPITAL_COMMUNITY): Payer: Medicare HMO

## 2022-05-25 DIAGNOSIS — Z79891 Long term (current) use of opiate analgesic: Secondary | ICD-10-CM | POA: Diagnosis not present

## 2022-05-25 DIAGNOSIS — I10 Essential (primary) hypertension: Secondary | ICD-10-CM | POA: Diagnosis not present

## 2022-05-25 DIAGNOSIS — R569 Unspecified convulsions: Secondary | ICD-10-CM

## 2022-05-25 DIAGNOSIS — I639 Cerebral infarction, unspecified: Secondary | ICD-10-CM | POA: Diagnosis not present

## 2022-05-25 DIAGNOSIS — R4781 Slurred speech: Secondary | ICD-10-CM | POA: Diagnosis not present

## 2022-05-25 DIAGNOSIS — I5032 Chronic diastolic (congestive) heart failure: Secondary | ICD-10-CM | POA: Insufficient documentation

## 2022-05-25 DIAGNOSIS — I110 Hypertensive heart disease with heart failure: Secondary | ICD-10-CM | POA: Diagnosis not present

## 2022-05-25 DIAGNOSIS — E1169 Type 2 diabetes mellitus with other specified complication: Secondary | ICD-10-CM | POA: Diagnosis not present

## 2022-05-25 DIAGNOSIS — Z79899 Other long term (current) drug therapy: Secondary | ICD-10-CM | POA: Diagnosis not present

## 2022-05-25 DIAGNOSIS — Z8673 Personal history of transient ischemic attack (TIA), and cerebral infarction without residual deficits: Secondary | ICD-10-CM | POA: Diagnosis not present

## 2022-05-25 DIAGNOSIS — M6281 Muscle weakness (generalized): Secondary | ICD-10-CM | POA: Insufficient documentation

## 2022-05-25 DIAGNOSIS — R531 Weakness: Principal | ICD-10-CM | POA: Insufficient documentation

## 2022-05-25 DIAGNOSIS — Z7982 Long term (current) use of aspirin: Secondary | ICD-10-CM | POA: Insufficient documentation

## 2022-05-25 DIAGNOSIS — G4489 Other headache syndrome: Secondary | ICD-10-CM | POA: Diagnosis not present

## 2022-05-25 DIAGNOSIS — Z7984 Long term (current) use of oral hypoglycemic drugs: Secondary | ICD-10-CM | POA: Diagnosis not present

## 2022-05-25 DIAGNOSIS — I480 Paroxysmal atrial fibrillation: Secondary | ICD-10-CM | POA: Diagnosis not present

## 2022-05-25 DIAGNOSIS — R001 Bradycardia, unspecified: Secondary | ICD-10-CM | POA: Diagnosis not present

## 2022-05-25 DIAGNOSIS — E119 Type 2 diabetes mellitus without complications: Secondary | ICD-10-CM | POA: Diagnosis not present

## 2022-05-25 DIAGNOSIS — Z96652 Presence of left artificial knee joint: Secondary | ICD-10-CM | POA: Insufficient documentation

## 2022-05-25 DIAGNOSIS — G40909 Epilepsy, unspecified, not intractable, without status epilepticus: Secondary | ICD-10-CM

## 2022-05-25 DIAGNOSIS — J449 Chronic obstructive pulmonary disease, unspecified: Secondary | ICD-10-CM | POA: Diagnosis not present

## 2022-05-25 DIAGNOSIS — R471 Dysarthria and anarthria: Secondary | ICD-10-CM | POA: Diagnosis not present

## 2022-05-25 DIAGNOSIS — E7800 Pure hypercholesterolemia, unspecified: Secondary | ICD-10-CM | POA: Diagnosis not present

## 2022-05-25 DIAGNOSIS — Z7951 Long term (current) use of inhaled steroids: Secondary | ICD-10-CM | POA: Insufficient documentation

## 2022-05-25 DIAGNOSIS — G4733 Obstructive sleep apnea (adult) (pediatric): Secondary | ICD-10-CM | POA: Diagnosis present

## 2022-05-25 DIAGNOSIS — E1140 Type 2 diabetes mellitus with diabetic neuropathy, unspecified: Secondary | ICD-10-CM | POA: Insufficient documentation

## 2022-05-25 DIAGNOSIS — Z7901 Long term (current) use of anticoagulants: Secondary | ICD-10-CM | POA: Insufficient documentation

## 2022-05-25 DIAGNOSIS — J45909 Unspecified asthma, uncomplicated: Secondary | ICD-10-CM | POA: Insufficient documentation

## 2022-05-25 DIAGNOSIS — E669 Obesity, unspecified: Secondary | ICD-10-CM

## 2022-05-25 DIAGNOSIS — R29818 Other symptoms and signs involving the nervous system: Secondary | ICD-10-CM | POA: Diagnosis not present

## 2022-05-25 DIAGNOSIS — R0902 Hypoxemia: Secondary | ICD-10-CM | POA: Diagnosis not present

## 2022-05-25 DIAGNOSIS — E66813 Obesity, class 3: Secondary | ICD-10-CM | POA: Diagnosis present

## 2022-05-25 DIAGNOSIS — G4700 Insomnia, unspecified: Secondary | ICD-10-CM

## 2022-05-25 DIAGNOSIS — I6523 Occlusion and stenosis of bilateral carotid arteries: Secondary | ICD-10-CM | POA: Diagnosis not present

## 2022-05-25 LAB — COMPREHENSIVE METABOLIC PANEL
ALT: 18 U/L (ref 0–44)
AST: 14 U/L — ABNORMAL LOW (ref 15–41)
Albumin: 3.7 g/dL (ref 3.5–5.0)
Alkaline Phosphatase: 78 U/L (ref 38–126)
Anion gap: 11 (ref 5–15)
BUN: 25 mg/dL — ABNORMAL HIGH (ref 8–23)
CO2: 29 mmol/L (ref 22–32)
Calcium: 9.1 mg/dL (ref 8.9–10.3)
Chloride: 100 mmol/L (ref 98–111)
Creatinine, Ser: 1.17 mg/dL (ref 0.61–1.24)
GFR, Estimated: >60 mL/min (ref >60)
Glucose, Bld: 175 mg/dL — ABNORMAL HIGH (ref 70–99)
Potassium: 4 mmol/L (ref 3.5–5.1)
Sodium: 140 mmol/L (ref 135–145)
Total Bilirubin: 0.3 mg/dL (ref 0.3–1.2)
Total Protein: 6.9 g/dL (ref 6.5–8.1)

## 2022-05-25 LAB — CBC
HCT: 46.7 % (ref 39.0–52.0)
Hemoglobin: 15.1 g/dL (ref 13.0–17.0)
MCH: 29.4 pg (ref 26.0–34.0)
MCHC: 32.3 g/dL (ref 30.0–36.0)
MCV: 90.9 fL (ref 80.0–100.0)
Platelets: 243 10*3/uL (ref 150–400)
RBC: 5.14 MIL/uL (ref 4.22–5.81)
RDW: 15.1 % (ref 11.5–15.5)
WBC: 11 10*3/uL — ABNORMAL HIGH (ref 4.0–10.5)
nRBC: 0 % (ref 0.0–0.2)

## 2022-05-25 LAB — DIFFERENTIAL
Abs Immature Granulocytes: 0.08 10*3/uL — ABNORMAL HIGH (ref 0.00–0.07)
Basophils Absolute: 0 10*3/uL (ref 0.0–0.1)
Basophils Relative: 0 %
Eosinophils Absolute: 0.3 10*3/uL (ref 0.0–0.5)
Eosinophils Relative: 3 %
Immature Granulocytes: 1 %
Lymphocytes Relative: 16 %
Lymphs Abs: 1.8 10*3/uL (ref 0.7–4.0)
Monocytes Absolute: 0.7 10*3/uL (ref 0.1–1.0)
Monocytes Relative: 7 %
Neutro Abs: 8.1 10*3/uL — ABNORMAL HIGH (ref 1.7–7.7)
Neutrophils Relative %: 73 %

## 2022-05-25 LAB — PROTIME-INR
INR: 1 (ref 0.8–1.2)
Prothrombin Time: 13.2 seconds (ref 11.4–15.2)

## 2022-05-25 LAB — CBG MONITORING, ED: Glucose-Capillary: 187 mg/dL — ABNORMAL HIGH (ref 70–99)

## 2022-05-25 LAB — APTT: aPTT: 28 seconds (ref 24–36)

## 2022-05-25 LAB — ETHANOL: Alcohol, Ethyl (B): <10 mg/dL (ref <10)

## 2022-05-25 MED ORDER — SODIUM CHLORIDE 0.9% FLUSH: 3.0000 mL | Freq: Once | INTRAVENOUS | Status: DC

## 2022-05-25 MED ORDER — IOHEXOL 350 MG/ML SOLN
75.0000 mL | Freq: Once | INTRAVENOUS | Status: AC | PRN
  Administered 2022-05-25: 75 mL via INTRAVENOUS

## 2022-05-25 NOTE — ED Triage Notes (Signed)
Pt BIB EMS from home for a headache and right sideed weakness starting at 130 today. Left sided headache.   Pt is on eliquis   180/100 75HR  90% RA  198 CBG

## 2022-05-25 NOTE — ED Notes (Signed)
Pt's partner would like updates. [phone]. [name].

## 2022-05-25 NOTE — ED Notes (Signed)
Called to room by family and was advised that the pt is having seizure like activity. Pt is having noted tremors to rt arm at this time. However pt is able to answer questions appropriately at this time. Dr. Oswald Hillock made aware and is at bedside at this time

## 2022-05-25 NOTE — ED Provider Notes (Signed)
[facility] EMERGENCY DEPARTMENT Provider Note   CSN: [id number] Arrival date & time: 05/25/22  2059     History No chief complaint on file.   HPI Lawrence [name] is a 66 y.o. male presenting for dysarthria, right-sided weakness, right-sided sensory loss that started at 1:30 PM today and has been progressive throughout the day today.  Patient activated as a code stroke by EMS with neurology at bedside on arrival.  Patient's recorded medical, surgical, social, medication list and allergies were reviewed in the Snapshot window as part of the initial history.   Review of Systems   Review of Systems  Constitutional:  Negative for chills and fever.  HENT:  Negative for ear pain and sore throat.   Eyes:  Negative for pain and visual disturbance.  Respiratory:  Negative for cough and shortness of breath.   Cardiovascular:  Negative for chest pain and palpitations.  Gastrointestinal:  Negative for abdominal pain and vomiting.  Genitourinary:  Negative for dysuria and hematuria.  Musculoskeletal:  Negative for arthralgias and back pain.  Skin:  Negative for color change and rash.  Neurological:  Positive for speech difficulty, weakness and numbness. Negative for seizures and syncope.  All other systems reviewed and are negative.   Physical Exam Updated Vital Signs There were no vitals taken for this visit. Physical Exam Vitals and nursing note reviewed.  Constitutional:      General: He is not in acute distress.    Appearance: He is well-developed.  HENT:     Head: Normocephalic and atraumatic.  Eyes:     Conjunctiva/sclera: Conjunctivae normal.  Cardiovascular:     Rate and Rhythm: Normal rate and regular rhythm.     Heart sounds: No murmur heard. Pulmonary:     Effort: Pulmonary effort is normal. No respiratory distress.     Breath sounds: Normal breath sounds.  Abdominal:     Palpations: Abdomen is soft.     Tenderness: There is no abdominal  tenderness.  Musculoskeletal:        General: No swelling.     Cervical back: Neck supple.  Skin:    General: Skin is warm and dry.     Capillary Refill: Capillary refill takes less than 2 seconds.  Neurological:     Mental Status: He is alert.  Psychiatric:        Mood and Affect: Mood normal.      ED Course/ Medical Decision Making/ A&P    Procedures Procedures   Medications Ordered in ED Medications  sodium chloride flush (NS) 0.9 % injection 3 mL (has no administration in time range)   Medical Decision Making:    AMIRR ACHORD is a 66 y.o. male who presented to the ED today with ***.  Due to these symptoms, nursing activated a CODE STROKE per hospital protocol.   {crccomplexity:27900}  On my initial exam, the pt was in no acute distress, glucose was *** and deficits include ***.  Deficits are *** persistent.   Reviewed and confirmed nursing documentation for past medical history, family history, social history.   Notably an effort was made to reach out to contacts regarding the following historical factors concerning contraindications for TPA: ***Unfortunately, patient cannot provide any further context and is not a candidate due to lack of available collateral information for safe administration of thrombolytics. ***Not a candidate due to timing of symptoms. ***No history of intracranial malignancy, ***no history of recent cranial/spinal/general surgery within 3 months, ***no recent CVA  within 3 months, ***duration of symptoms less than 4.5 hours, ***no history of uncontrolled HTN, ***no anticoagulation use.     Initial Assessment and Plan:   Patient immediately evaluated jointly by tele***neurology and emergency department providers.   {crccopa:27899}  ***Patient's HPI and PE findings are most consistent with acute CVA. Proceeded with [facility] and CTA per institutional stroke policy which revealed ***. Given the timing and severity of the patient's symptoms, patient is a  TPA candidate per neurology. Treatment initiated and patient monitored Q34mnutes while being arranged for admission to the neurology ICU. BP controlled with *** to ensure safe administration of the thrombolytics. Patient transferred into the ICU at this time with no further acute events   ***Patient's HPI and PE findings are most consistent with completed CVA. [facility] which revealed ***. ED/neurology evaluation is most consistent with LVO. Given the nature/timing of the symptoms, <48 hours, patient is a candidate for thrombectomy. Patient stabilized in ED while being arranged for IR/NS intervention. Patient admitted to the neuro ICU with no further acute events.   ***Patient's HPI and PE findings are most consistent with completed CVA. [facility] which revealed ***. ED/neurology evaluation is most consistent with LVO. Given the nature/timing of the symptoms, delayed presentation, patient is not a candidate for thrombectomy. Patient stabilized in ED while being arranged for neurology admission.   ***Patient's HPI and PE findings are most consistent with TIA. [facility] which [facility] ***. ED/neurology evaluation is most consistent with TIA with resolved symptoms at this time. Given the nature/timing of the symptoms, patient requires MRI and further risk stratification. Patient stable in ED and will be arranged for TIA observation protocol in CDU with neurology consultation.   Initial Study Results: Labs Labs reviewed without evidence of clinically relevant abnormality. ***Exceptions include:***  ***EKG EKG was reviewed independently. Rate, rhythm, axis, intervals all examined and without medically relevant abnormality. ST segments without concerns for elevations.    Radiology  Images reviewed independently, agree with radiology report at this time.   CT ANGIO HEAD NECK W WO  CM (CODE STROKE)  Final Result    CT HEAD CODE STROKE WO CONTRAST  Final Result        Final Assessment and Plan:   ***   Clinical Impression: No diagnosis found.   Data Unavailable   Final Clinical Impression(s) / ED Diagnoses Final diagnoses:  None    Rx / DC Orders ED Discharge Orders     None

## 2022-05-25 NOTE — H&P (Signed)
History and Physical    Patient: Lawrence [name] NAT:[id number] DOB: Jul 07, 1956 DOA: 05/25/2022 DOS: the patient was seen and examined on 05/26/2022 PCP: [name], PA-C  Patient coming from: Home  Chief Complaint:  Chief Complaint  Patient presents with   Code Stroke   HPI: Lawrence [name] is a 66 y.o. male with medical history significant of prior CVA with residual left-sided hemiparesis, morbid obesity, chronic diastolic heart failure, OSA on CPAP, type 2 diabetes, hypertension, hyperlipidemia, paroxysmal atrial fibrillation on Eliquis, suspected seizure on Keppra/Vimpat, COPD who presents with right-sided weakness, dysarthria.   Patient limited historian but reports symptoms of a left-sided headache since around 4:30 PM today.  Then had acute right-sided upper and lower extremity weakness.  Has dysarthria but reports having chronic dry mouth so unclear if it was due to that.  Denies any seizure-like activity.  No chest pain or palpitations.  Reports seeing double vision but has been chronic and has to wear bifocals.  Patient was hospitalized from 02/24/2022 to 02/26/2022 with shaking of arm, left facial droop.  CVA work-up was negative with CT head, CTA head and neck, MRI brain.  Neurology was consulted at that time and thought likely due to a breakthrough seizure.  Vimpat was added in addition to his home Keppra.  For CVA prophylaxis, patient advised to continue Eliquis and also had aspirin 81 mg daily added.  He reports possibly missing doses of his Eliquis today. Denies being on aspirin and reports being told by neurology to discontinue at some point.    He presented as a code stroke.  Had negative CT head. CTA head and neck negative for any LVO.  He was otherwise normotensive.  WBC of 11, hemoglobin 15.1 Sodium of 140, K of 4, creatinine 1.77, CBG 175  EtOH less than 10 Review of Systems: As mentioned in the history of present illness. All other systems reviewed and are  negative. Past Medical History:  Diagnosis Date   Acquired dilation of ascending aorta and aortic root ([place])    69m by echo 01/2021   Adenomatous colon polyp 2007   Anemia    Anxiety    Aortic stenosis    moderate AS by echo 01/2021   Asthma    BPH without obstruction/lower urinary tract symptoms 02/22/2017   Chronic diastolic (congestive) heart failure (HCC)    Chronic venous stasis 03/07/2019   COPD (chronic obstructive pulmonary disease) (HCC)    Coronary artery calcification seen on CAT scan    Depression    Diabetic neuropathy ([place] 09/11/2019   History of colon polyps 08/24/2018   Hypertension    Morbid obesity (HCC)    OSA (obstructive sleep apnea)    Pain due to onychomycosis of toenails of both feet 09/11/2019   Peripheral neuropathy 02/22/2017   Primary osteoarthritis, left shoulder 03/05/2017   PTSD (post-traumatic stress disorder)    Pure hypercholesterolemia    QT prolongation 03/07/2019   Seizures (HCC)    Shortness of breath    Sinus tachycardia 03/07/2019   Sleep apnea    CPAP   Type 2 diabetes mellitus with vascular disease ([place] 09/11/2019   Past Surgical History:  Procedure Laterality Date   ENDOVENOUS ABLATION SAPHENOUS VEIN W/ LASER Right 08/20/2020   endovenous laser ablation right greater saphenous vein by CGae GallopMD    JOINT REPLACEMENT     left knee replacement x 2   KNEE ARTHROSCOPY Bilateral    LEFT HEART CATH AND CORONARY ANGIOGRAPHY N/A 01/17/2018  Procedure: LEFT HEART CATH AND CORONARY ANGIOGRAPHY;  Surgeon: Leonie Man, MD;  Location: [place] [facility];  Service: Cardiovascular;  Laterality: N/A;   PRESSURE SENSOR/CARDIOMEMS N/A 08/26/2021   Procedure: PRESSURE SENSOR/CARDIOMEMS;  Surgeon: Larey Dresser, MD;  Location: [place] [facility];  Service: Cardiovascular;  Laterality: N/A;   RIGHT HEART CATH N/A 08/26/2021   Procedure: RIGHT HEART CATH;  Surgeon: Larey Dresser, MD;  Location: [place] [facility];  Service:  Cardiovascular;  Laterality: N/A;   TEE WITHOUT CARDIOVERSION N/A 08/26/2021   Procedure: TRANSESOPHAGEAL ECHOCARDIOGRAM (TEE);  Surgeon: Larey Dresser, MD;  Location: [facility] ENDOSCOPY;  Service: Cardiovascular;  Laterality: N/A;   UMBILICAL HERNIA REPAIR     Social History:  reports that he quit smoking about 6 years ago. His smoking use included cigarettes. He has a 44.00 pack-year smoking history. He has been exposed to tobacco smoke. He has never used smokeless tobacco. He reports current alcohol use. He reports that he does not use drugs.  Allergies  Allergen Reactions   Vancomycin Other (See Comments)    "Red Man Syndrome" 02/02/17: possible cause for rash under both arms   Niacin Other (See Comments)   Niacin And Related Other (See Comments)    Red man syndrome   Tubersol [Tuberculin, Ppd] Other (See Comments)    Reaction unknown   Doxycycline Rash and Other (See Comments)    Family History  Problem Relation Age of Onset   CAD Maternal Grandfather    Diabetes Other    Diabetes Mellitus II Neg Hx    Colon cancer Neg Hx    Esophageal cancer Neg Hx    Inflammatory bowel disease Neg Hx    Liver disease Neg Hx    Pancreatic cancer Neg Hx    Rectal cancer Neg Hx    Stomach cancer Neg Hx    Sleep apnea Neg Hx     Prior to Admission medications   Medication Sig Start Date End Date Taking? Authorizing Provider  Accu-Chek FastClix Lancets MISC 1 each by Other route in the morning and at bedtime. 12/22/20   [provider]  acetaminophen (TYLENOL) 325 MG tablet Take 2 tablets (650 mg total) by mouth every 4 (four) hours as needed for mild pain (or temp > 37.5 C (99.5 F)). 01/26/21   Cherene Altes, MD  albuterol (VENTOLIN HFA) 108 (90 Base) MCG/ACT inhaler Inhale 2 puffs into the lungs every 6 (six) hours as needed for wheezing or shortness of breath. 02/02/22   Hunsucker, Bonna Gains, MD  Alcohol Swabs (ALCOHOL PREP) 70 % PADS 1 each by Other route in the morning, at noon,  and at bedtime. 12/22/20   [provider]  allopurinol (ZYLOPRIM) 100 MG tablet Take 200 mg by mouth daily.    [provider]  ARIPiprazole (ABILIFY) 5 MG tablet TAKE ONE TABLET BY MOUTH EVERYDAY AT BEDTIME 03/09/22   Mozingo, Berdie Ogren, NP  aspirin EC 81 MG tablet Take 1 tablet (81 mg total) by mouth daily. Swallow whole. 02/27/22   Terrilee Croak, MD  atorvastatin (LIPITOR) 40 MG tablet Take 1 tablet (40 mg total) by mouth every evening. TAKE ONE TABLET BY MOUTH DAILY AT 6PM 06/24/21   Sueanne Margarita, MD  busPIRone (BUSPAR) 10 MG tablet Take 1 tablet (10 mg total) by mouth 3 (three) times daily. 03/09/22   Mozingo, Berdie Ogren, NP  cetirizine (ZYRTEC) 10 MG tablet Take 10 mg by mouth at bedtime.  [provider]  Cholecalciferol (VITAMIN D3 PO) Take 2,000 Units by mouth daily.    [provider]  Continuous Blood Gluc Receiver (DEXCOM G7 RECEIVER) DEVI To display CGM data 03/23/22   Elayne Snare, MD  Continuous Blood Gluc Sensor (DEXCOM G7 SENSOR) MISC 1 Device by Does not apply route as directed. Change sensor every 10 days 03/23/22   Elayne Snare, MD  diazePAM, 15 MG Dose, (VALTOCO 15 MG DOSE) 2 x 7.5 MG/0.1ML LQPK Place 15 mg into the nose as needed (for seizure lasting more than 5 min). Patient not taking: Reported on 03/31/2022 03/24/22   Alric Ran, MD  diltiazem (CARDIZEM CD) 180 MG 24 hr capsule TAKE ONE CAPSULE BY MOUTH ONCE DAILY 01/06/22   Sueanne Margarita, MD  ELIQUIS 5 MG TABS tablet TAKE ONE TABLET BY MOUTH TWICE DAILY 01/06/22   Larey Dresser, MD  FARXIGA 10 MG TABS tablet TAKE ONE TABLET BY MOUTH BEFORE BREAKFAST 10/01/21   Renato Shin, MD  ferrous sulfate 324 (65 Fe) MG TBEC Take 324 mg by mouth daily with breakfast.     [provider]  fluticasone (FLONASE) 50 MCG/ACT nasal spray Place 2 sprays into both nostrils daily.     [provider]  Fluticasone-Umeclidin-Vilant (TRELEGY ELLIPTA) 200-62.5-25 MCG/ACT AEPB Inhale 1  puff into the lungs daily. 10/18/21   Hunsucker, Bonna Gains, MD  insulin degludec (TRESIBA FLEXTOUCH) 100 UNIT/ML FlexTouch Pen INJECT A max of 30 UNITS SUBCUTANEOUSLY DAILY 04/12/22   Elayne Snare, MD  isosorbide mononitrate (IMDUR) 30 MG 24 hr tablet TAKE ONE TABLET BY MOUTH EVERY EVENING 10/01/21   Sueanne Margarita, MD  lacosamide 150 MG TABS Take 1 tablet (150 mg total) by mouth 2 (two) times daily. 04/05/22   Alric Ran, MD  levETIRAcetam (KEPPRA) 500 MG tablet Take 1 tablet (500 mg total) by mouth 2 (two) times daily. 09/14/21   Alric Ran, MD  metFORMIN (GLUCOPHAGE-XR) 500 MG 24 hr tablet Take 4 tablets (2,000 mg total) by mouth daily. Patient taking differently: Take 1,000 mg by mouth 2 (two) times daily with a meal. 06/16/21   Renato Shin, MD  metoprolol succinate (TOPROL-XL) 50 MG 24 hr tablet Take 1 tablet by mouth daily. Take with or immediately following a meal. 01/06/22   Turner, Eber Hong, MD  omeprazole (PRILOSEC) 20 MG capsule Take 20 mg by mouth every morning. 05/29/20   [provider]  potassium chloride SA (KLOR-CON M) 20 MEQ tablet Take 2 tablets (40 mEq total) by mouth 2 (two) times daily. 08/26/21   Larey Dresser, MD  prazosin (MINIPRESS) 2 MG capsule Take two tablets at bedtime. 03/09/22   Mozingo, Berdie Ogren, NP  pregabalin (LYRICA) 150 MG capsule TAKE ONE CAPSULE BY MOUTH THREE TIMES DAILY 04/17/22   Sater, [name], MD  repaglinide (PRANDIN) 2 MG tablet Take 2 tablets (4 mg total) by mouth 2 (two) times daily before a meal. 07/15/21   Renato Shin, MD  spironolactone (ALDACTONE) 25 MG tablet Take 1 tablet (25 mg total) by mouth daily. 06/24/21   Sueanne Margarita, MD  temazepam (RESTORIL) 15 MG capsule TAKE ONE CAPSULE BY MOUTH EVERYDAY AT BEDTIME 05/17/22   Mozingo, Berdie Ogren, NP  tirzepatide [facility]) 5 MG/0.5ML Pen INJECT '5mg'$  FOR four WEEKS 05/18/22   Elayne Snare, MD  torsemide (DEMADEX) 20 MG tablet TAKE 4 TABLETS BY MOUTH EVERY MORNING and TAKE THREE  TABLETS BY MOUTH EVERY EVENING 04/12/22   Larey Dresser, MD  traZODone (DESYREL) 100 MG tablet Take 2 tablets (200 mg total) by mouth at bedtime. Take 200 mg by mouth at bedtime. 03/09/22   Mozingo, Berdie Ogren, NP  TRULICITY 4.5 VO/5.3GU SOPN INJECT 4.'5mg'$  into THE SKIN ONCE A WEEK 10/11/21   Renato Shin, MD  VASCEPA 1 g capsule TAKE TWO CAPSULES BY MOUTH TWICE DAILY 04/07/22   Larey Dresser, MD  venlafaxine XR (EFFEXOR-XR) 75 MG 24 hr capsule Take 3 capsules (225 mg total) by mouth daily with breakfast. 03/09/22   Mozingo, Berdie Ogren, NP    Physical Exam: Vitals:   05/25/22 2200 05/25/22 2215 05/25/22 2230 05/25/22 2245  BP: 101/60 103/60 107/69 (!) 118/98  Pulse: 83 84 86 84  Resp: '17 16 16 20  '$ SpO2: 92% 91% 91% 92%  Weight:      Height:       Constitutional: NAD, calm, comfortable, drowsy appearing morbidly obese male sitting upright in bed and would appear to fall asleep at times during evaluation Eyes: lids and conjunctivae normal ENMT: Mucous membranes are moist. Neck: normal, supple Respiratory: clear to auscultation bilaterally, no wheezing, no crackles. Normal respiratory effort. No accessory muscle use.  Cardiovascular: Regular rate and rhythm, no murmurs / rubs / gallops.  Bilateral nonpitting edema up to pretibial region. Abdomen: Soft, nondistended and nontender.  Bowel sounds positive.  Musculoskeletal: no clubbing / cyanosis. No joint deformity upper and lower extremities. . Normal muscle tone.  Skin: no rashes, lesions, ulcers.  Neurologic: CN 2-12 grossly intact.  Unable to lift left lower extremity.  5/ 5 strength of the left upper extremity.  3 /5 right upper extremity.  2 /5 of right lower extremity.  Difficulty with right finger-to-nose due to right upper extremity weakness.  No dysarthria.  No facial asymmetry. Psychiatric: Normal judgment and insight. Alert and oriented x 3. Normal mood. Data Reviewed:  See HPI  Assessment and Plan: * Right sided  weakness -Hx of recurrent strokes with left-sided residual hemiparesis on Eliquis.  Denies any missed doses other than today.  Continues to have persistent right-sided weakness on exam. -CTA head negative.  CTA head and neck negative for LVO. -- Obtain MRI brain.  Patient has spinal cord stimulator but has remote to turn off. -Obtain echocardiogram  -Continue Eliquis per neurology -Obtain A1c and lipids -PT/OT/SLT -Frequent neuro checks and keep on telemetry -Allow for permissive hypertension with blood pressure treatment as needed only if systolic goes [id number]  AF (paroxysmal atrial fibrillation) (HCC) Continue Eliquis Continue Toprol XL and diltiazem  Chronic diastolic CHF (congestive heart failure) (HCC) Stable.  NYHA II -Holding diuretics overnight to allow for gentle fluid hydration overnight.  Will resume torsemide and spironolactone in the morning  Diabetes mellitus type 2 in obese (HCC) Last hemoglobin A1c of 7.4 in August -Placed on sensitive sliding scale  Morbid obesity (HCC) BMI of 48  OSA (obstructive sleep apnea) Continue nightly BiPAP  Seizure ([place]) History of seizure.  Low suspicion of breakthrough seizures with current symptoms.  Continue Keppra and Vimpat.  Essential hypertension Allow for permissive hypertension for 24 hours.  Treat SBP if >180 per neurology.  Currently normotensive.  Pure hypercholesterolemia Continue statin      Advance Care Planning:   Code Status: DNR-confirmed with patient and had paperwork at bedside  Consults: Neurology  Family Communication: Discussed with significant other Lawrence [name]   Severity of Illness: The appropriate patient status for this patient is OBSERVATION. Observation status is judged to be reasonable and necessary in order  to provide the required intensity of service to ensure the patient's safety. The patient's presenting symptoms, physical exam findings, and initial radiographic and laboratory data in the context  of their medical condition is felt to place them at decreased risk for further clinical deterioration. Furthermore, it is anticipated that the patient will be medically stable for discharge from the hospital within 2 midnights of admission.   Author: Orene Desanctis, DO 05/26/2022 12:15 AM  For on call review [email or web address].

## 2022-05-25 NOTE — Code Documentation (Signed)
Stroke Response Nurse Documentation Code Documentation  Lawrence [name] is a 66 y.o. male arriving to [name]  via [facility] on 10/18 with past medical hx of CVA, afib, morbid obesity,CHF, HLD, DM2, OSA . On Eliquis (apixaban) daily. Code stroke was activated by EMS.   Patient from home where he was LKW at 1630 and now complaining of right sided weakness.   Stroke team at the bedside on patient arrival. Labs drawn and patient cleared for CT by Dr. Oswald Hillock. Patient to CT with team. NIHSS 7, see documentation for details and code stroke times. Patient with right arm weakness, right leg weakness, right decreased sensation, and dysarthria  on exam. The following imaging was completed:  CT Head and CTA. Patient is not a candidate for IV Thrombolytic due to Eliquis. Patient is not a candidate for IR due to No LVO.    Bedside handoff with ED RN Vaughan Basta.    Madelynn Done  Rapid Response RN

## 2022-05-25 NOTE — Consult Note (Signed)
NEURO HOSPITALIST CONSULT NOTE   Requestig physician: Dr. Oswald Hillock  Reason for Consult: Acute onset of right sided weakness, right sensory loss and dysarthria  History obtained from:  EMS, Patient and Chart     HPI:                                                                                                                                          Lawrence [name] is an 66 y.o. male with a PMHx of paroxysmal atrial fibrillation on Eliquis, 3 prior strokes, residual left sided weakness secondary to stroke, DM, HTN, dilated ascending aorta and aortic root, anemia, aortic stenosis, BPH, chronic diastolic HF, chronic venous stasis, COPD, diabetic neuropathy, morbid obesity, OSA, PTSD, hypercholesterolemia and seizures (on lacosamide and Keppra) who presents to the ED as a Code Stroke. LKN was stated by patient to be 4:30 PM. He had called EMS at approximately 8:15 PM after onset of left sided frontal headache (rated as 3/10) in conjunction with RUE weakness. Vitals per EMS: BP 185/130, HR 75, CBG 187, O2 saturation 90%. On arrival to the ED the patient continued to exhibit the above deficits. Speech was intact in the context of lethargy.   Past Medical History:  Diagnosis Date   Acquired dilation of ascending aorta and aortic root ([place])    70m by echo 01/2021   Adenomatous colon polyp 2007   Anemia    Anxiety    Aortic stenosis    moderate AS by echo 01/2021   Asthma    BPH without obstruction/lower urinary tract symptoms 02/22/2017   Chronic diastolic (congestive) heart failure (HCC)    Chronic venous stasis 03/07/2019   COPD (chronic obstructive pulmonary disease) (HCC)    Coronary artery calcification seen on CAT scan    Depression    Diabetic neuropathy ([place] 09/11/2019   History of colon polyps 08/24/2018   Hypertension    Morbid obesity (HCC)    OSA (obstructive sleep apnea)    Pain due to onychomycosis of toenails of both feet 09/11/2019   Peripheral  neuropathy 02/22/2017   Primary osteoarthritis, left shoulder 03/05/2017   PTSD (post-traumatic stress disorder)    Pure hypercholesterolemia    QT prolongation 03/07/2019   Seizures (HCC)    Shortness of breath    Sinus tachycardia 03/07/2019   Sleep apnea    CPAP   Type 2 diabetes mellitus with vascular disease ([place] 09/11/2019   Past Surgical History:  Procedure Laterality Date   ENDOVENOUS ABLATION SAPHENOUS VEIN W/ LASER Right 08/20/2020   endovenous laser ablation right greater saphenous vein by CGae GallopMD    JOINT REPLACEMENT     left knee replacement x 2   KNEE ARTHROSCOPY Bilateral    LEFT HEART CATH AND CORONARY ANGIOGRAPHY N/A  01/17/2018   Procedure: LEFT HEART CATH AND CORONARY ANGIOGRAPHY;  Surgeon: Leonie Man, MD;  Location: [place] [facility];  Service: Cardiovascular;  Laterality: N/A;   PRESSURE SENSOR/CARDIOMEMS N/A 08/26/2021   Procedure: PRESSURE SENSOR/CARDIOMEMS;  Surgeon: Larey Dresser, MD;  Location: [place] [facility];  Service: Cardiovascular;  Laterality: N/A;   RIGHT HEART CATH N/A 08/26/2021   Procedure: RIGHT HEART CATH;  Surgeon: Larey Dresser, MD;  Location: [place] [facility];  Service: Cardiovascular;  Laterality: N/A;   TEE WITHOUT CARDIOVERSION N/A 08/26/2021   Procedure: TRANSESOPHAGEAL ECHOCARDIOGRAM (TEE);  Surgeon: Larey Dresser, MD;  Location: [facility];  Service: Cardiovascular;  Laterality: N/A;   UMBILICAL HERNIA REPAIR      Family History  Problem Relation Age of Onset   CAD Maternal Grandfather    Diabetes Other    Diabetes Mellitus II Neg Hx    Colon cancer Neg Hx    Esophageal cancer Neg Hx    Inflammatory bowel disease Neg Hx    Liver disease Neg Hx    Pancreatic cancer Neg Hx    Rectal cancer Neg Hx    Stomach cancer Neg Hx    Sleep apnea Neg Hx            Social History:  reports that he quit smoking about 6 years ago. His smoking use included cigarettes. He has a 44.00 pack-year smoking history. He  has been exposed to tobacco smoke. He has never used smokeless tobacco. He reports current alcohol use. He reports that he does not use drugs.  Allergies  Allergen Reactions   Vancomycin Other (See Comments)    "Red Man Syndrome" 02/02/17: possible cause for rash under both arms   Niacin Other (See Comments)   Niacin And Related Other (See Comments)    Red man syndrome   Tubersol [Tuberculin, Ppd] Other (See Comments)    Reaction unknown   Doxycycline Rash and Other (See Comments)    MEDICATIONS:                                                                                                                      No current facility-administered medications on file prior to encounter.   Current Outpatient Medications on File Prior to Encounter  Medication Sig Dispense Refill   Accu-Chek FastClix Lancets MISC 1 each by Other route in the morning and at bedtime.     acetaminophen (TYLENOL) 325 MG tablet Take 2 tablets (650 mg total) by mouth every 4 (four) hours as needed for mild pain (or temp > 37.5 C (99.5 F)).     albuterol (VENTOLIN HFA) 108 (90 Base) MCG/ACT inhaler Inhale 2 puffs into the lungs every 6 (six) hours as needed for wheezing or shortness of breath. 1 each 6   Alcohol Swabs (ALCOHOL PREP) 70 % PADS 1 each by Other route in the morning, at noon, and at bedtime.     allopurinol (ZYLOPRIM) 100 MG tablet Take 200 mg  by mouth daily.     ARIPiprazole (ABILIFY) 5 MG tablet TAKE ONE TABLET BY MOUTH EVERYDAY AT BEDTIME 90 tablet 3   aspirin EC 81 MG tablet Take 1 tablet (81 mg total) by mouth daily. Swallow whole. 30 tablet 12   atorvastatin (LIPITOR) 40 MG tablet Take 1 tablet (40 mg total) by mouth every evening. TAKE ONE TABLET BY MOUTH DAILY AT 6PM 90 tablet 3   busPIRone (BUSPAR) 10 MG tablet Take 1 tablet (10 mg total) by mouth 3 (three) times daily. 270 tablet 3   cetirizine (ZYRTEC) 10 MG tablet Take 10 mg by mouth at bedtime.      Cholecalciferol (VITAMIN D3 PO) Take 2,000  Units by mouth daily.     Continuous Blood Gluc Receiver (DEXCOM G7 RECEIVER) DEVI To display CGM data 1 each 0   Continuous Blood Gluc Sensor (DEXCOM G7 SENSOR) MISC 1 Device by Does not apply route as directed. Change sensor every 10 days 3 each 3   diazePAM, 15 MG Dose, (VALTOCO 15 MG DOSE) 2 x 7.5 MG/0.1ML LQPK Place 15 mg into the nose as needed (for seizure lasting more than 5 min). (Patient not taking: Reported on 03/31/2022) 2 each 5   diltiazem (CARDIZEM CD) 180 MG 24 hr capsule TAKE ONE CAPSULE BY MOUTH ONCE DAILY 90 capsule 1   ELIQUIS 5 MG TABS tablet TAKE ONE TABLET BY MOUTH TWICE DAILY 180 tablet 2   FARXIGA 10 MG TABS tablet TAKE ONE TABLET BY MOUTH BEFORE BREAKFAST 90 tablet 3   ferrous sulfate 324 (65 Fe) MG TBEC Take 324 mg by mouth daily with breakfast.      fluticasone (FLONASE) 50 MCG/ACT nasal spray Place 2 sprays into both nostrils daily.      Fluticasone-Umeclidin-Vilant (TRELEGY ELLIPTA) 200-62.5-25 MCG/ACT AEPB Inhale 1 puff into the lungs daily. 60 each 11   insulin degludec (TRESIBA FLEXTOUCH) 100 UNIT/ML FlexTouch Pen INJECT A max of 30 UNITS SUBCUTANEOUSLY DAILY 15 mL 1   isosorbide mononitrate (IMDUR) 30 MG 24 hr tablet TAKE ONE TABLET BY MOUTH EVERY EVENING 90 tablet 3   lacosamide 150 MG TABS Take 1 tablet (150 mg total) by mouth 2 (two) times daily. 180 tablet 0   levETIRAcetam (KEPPRA) 500 MG tablet Take 1 tablet (500 mg total) by mouth 2 (two) times daily. 120 tablet 11   metFORMIN (GLUCOPHAGE-XR) 500 MG 24 hr tablet Take 4 tablets (2,000 mg total) by mouth daily. (Patient taking differently: Take 1,000 mg by mouth 2 (two) times daily with a meal.) 360 tablet 3   metoprolol succinate (TOPROL-XL) 50 MG 24 hr tablet Take 1 tablet by mouth daily. Take with or immediately following a meal. 90 tablet 1   omeprazole (PRILOSEC) 20 MG capsule Take 20 mg by mouth every morning.     potassium chloride SA (KLOR-CON M) 20 MEQ tablet Take 2 tablets (40 mEq total) by mouth 2  (two) times daily. 270 tablet 3   prazosin (MINIPRESS) 2 MG capsule Take two tablets at bedtime. 180 capsule 3   pregabalin (LYRICA) 150 MG capsule TAKE ONE CAPSULE BY MOUTH THREE TIMES DAILY 270 capsule 0   repaglinide (PRANDIN) 2 MG tablet Take 2 tablets (4 mg total) by mouth 2 (two) times daily before a meal. 360 tablet 3   spironolactone (ALDACTONE) 25 MG tablet Take 1 tablet (25 mg total) by mouth daily. 90 tablet 2   temazepam (RESTORIL) 15 MG capsule TAKE ONE CAPSULE BY MOUTH EVERYDAY AT BEDTIME 90  capsule 0   tirzepatide (MOUNJARO) 5 MG/0.5ML Pen INJECT '5mg'$  FOR four WEEKS 2 mL 0   torsemide (DEMADEX) 20 MG tablet TAKE 4 TABLETS BY MOUTH EVERY MORNING and TAKE THREE TABLETS BY MOUTH EVERY EVENING 450 tablet 3   traZODone (DESYREL) 100 MG tablet Take 2 tablets (200 mg total) by mouth at bedtime. Take 200 mg by mouth at bedtime. 675 tablet 3   TRULICITY 4.5 FF/6.3WG SOPN INJECT 4.'5mg'$  into THE SKIN ONCE A WEEK 6 mL 3   VASCEPA 1 g capsule TAKE TWO CAPSULES BY MOUTH TWICE DAILY 120 capsule 5   venlafaxine XR (EFFEXOR-XR) 75 MG 24 hr capsule Take 3 capsules (225 mg total) by mouth daily with breakfast. 270 capsule 3     ROS:                                                                                                                                       As per HPI. In the context of lethargy, the patient is unable to provide a comprehensive ROS.   BP (!) 118/98   Pulse 84   Resp 20   Ht '6\' 1"'$  (1.854 m)   Wt (!) 167.4 kg   SpO2 92%   BMI 48.68 kg/m   General Examination:                                                                                                       Physical Exam  HEENT-  /AT    Lungs- On NRB mask Extremities- Prominent nonpitting edema, thickening of skin and venous stasis pigmentary changes to BLE below knees.    Neurological Examination Mental Status: Lethargic. Oriented to "Tuesday or Wednesday", the month, year, city and state. Speech is sparse but  fluent with intact comprehension for all basic questons and commands. Naming intact. No hemineglect.  Cranial Nerves: II: Visual fields intact in all 4 quadrants of each eye. PERRL  III,IV, VI: No ptosis. EOMI.  V: Temp sensation equal bilaterally  VII: Smile symmetric VIII: Hearing intact to voice IX,X: No hoarseness, but mildly hypophonic XI: Symmetric shoulder shrug XII: Midline tongue extension Motor: RUE 3/5 proximally and distally, rapidly falling back to the bed after elevation LUE 4+/5 proximally and distally RLE 2/5 hip flexion, 3/5 knee extension and knee flexion LLE 3/5 hip flexion, 4/5 knee extension and knee flexion.  Sensory: Decreased temp and FT sensation to distal BLE in a stocking distribution with proximal to distal gradient. Overlying moderate  sensory loss to right thigh and RUE only able to sense scratch, but not FT.  Deep Tendon Reflexes: 1+ bilateral brachioradialis and biceps. 1+ right patellar; deferred left patellar due to prior knee operation. 0 bilateral achilles. Toes mute bilaterally.  Cerebellar: No ataxia with FNF on the left. Unable to perform on the right.   Gait: Deferred   Lab Results: Basic Metabolic Panel: No results for input(s): "NA", "K", "CL", "CO2", "GLUCOSE", "BUN", "CREATININE", "CALCIUM", "MG", "PHOS" in the last 168 hours.  CBC: Recent Labs  Lab 05/25/22 2101  WBC 11.0*  NEUTROABS 8.1*  HGB 15.1  HCT 46.7  MCV 90.9  PLT 243    Cardiac Enzymes: No results for input(s): "CKTOTAL", "CKMB", "CKMBINDEX", "TROPONINI" in the last 168 hours.  Lipid Panel: No results for input(s): "CHOL", "TRIG", "HDL", "CHOLHDL", "VLDL", "LDLCALC" in the last 168 hours.  Imaging: No results found.   Assessment: 66 year old male with a history of paroxysmal atrial fibrillation on Eliquis, seizures, 3 prior strokes and chronic left sided weakness secondary to prior stroke, who presents with acute onset of right sided weakness, right sided sensory loss  and dysarthria - Exam reveals findings referable to the left MCA territory. NIHSS 7.  - CT head: No evidence of acute intracranial abnormality. ASPECTS is 10. - CTA of head and neck: Negative CTA for large vessel occlusion or other emergent finding. Mild atheromatous change about the aortic arch, carotid bifurcations and carotid siphons without hemodynamically significant stenosis. - EKG: Sinus rhythm; Prolonged PR interval; Nonspecific IVCD with LAD; Inferior infarct, old - Labs unremarkable except for elevated glucose, WBC of 11 and BUN of 25, the latter suggestive of possible volume depletion given poorly hydrated oral mucosa on exam.  -  Seizure disorder: On Keppra and Vimpat at home. No clinical seizure activity on exam.  - Impression: Suspect acute lacunar infarction involving the left thalamus, IC or basal ganglia.  Recommendations: - Continue Eliquis for secondary stroke prevention - Continue home Keppra and Vimpat - MRI brain - TTE - HgbA1c, fasting lipid panel - PT consult, OT consult, Speech consult - Risk factor modification to include diet and gentle exercise towards goal of improved BMI, BP and cardiac function - Telemetry monitoring - Frequent neuro checks - NPO until passes stroke swallow screen - Glycemic control  - Permissive HTN x 24 hours with modified parameters given diastolic HF. Treat SBP if > 180 - Gentle hydration. Monitor UOP, mucosal hydration and BUN/Cr.      Electronically signed: Dr. Kerney Elbe 05/25/2022, 9:24 PM

## 2022-05-26 ENCOUNTER — Observation Stay (HOSPITAL_BASED_OUTPATIENT_CLINIC_OR_DEPARTMENT_OTHER): Payer: Medicare HMO

## 2022-05-26 ENCOUNTER — Observation Stay (HOSPITAL_COMMUNITY): Payer: Medicare HMO

## 2022-05-26 ENCOUNTER — Other Ambulatory Visit: Payer: Self-pay

## 2022-05-26 DIAGNOSIS — R569 Unspecified convulsions: Secondary | ICD-10-CM | POA: Diagnosis not present

## 2022-05-26 DIAGNOSIS — E1169 Type 2 diabetes mellitus with other specified complication: Secondary | ICD-10-CM | POA: Diagnosis not present

## 2022-05-26 DIAGNOSIS — I10 Essential (primary) hypertension: Secondary | ICD-10-CM | POA: Diagnosis not present

## 2022-05-26 DIAGNOSIS — R531 Weakness: Secondary | ICD-10-CM

## 2022-05-26 DIAGNOSIS — E669 Obesity, unspecified: Secondary | ICD-10-CM | POA: Diagnosis not present

## 2022-05-26 DIAGNOSIS — E7800 Pure hypercholesterolemia, unspecified: Secondary | ICD-10-CM | POA: Diagnosis not present

## 2022-05-26 DIAGNOSIS — G4733 Obstructive sleep apnea (adult) (pediatric): Secondary | ICD-10-CM | POA: Diagnosis not present

## 2022-05-26 DIAGNOSIS — I5032 Chronic diastolic (congestive) heart failure: Secondary | ICD-10-CM | POA: Diagnosis not present

## 2022-05-26 DIAGNOSIS — I480 Paroxysmal atrial fibrillation: Secondary | ICD-10-CM | POA: Diagnosis not present

## 2022-05-26 LAB — I-STAT CHEM 8, ED
BUN: 30 mg/dL — ABNORMAL HIGH (ref 8–23)
Calcium, Ion: 1.06 mmol/L — ABNORMAL LOW (ref 1.15–1.40)
Chloride: 99 mmol/L (ref 98–111)
Creatinine, Ser: 1.2 mg/dL (ref 0.61–1.24)
Glucose, Bld: 173 mg/dL — ABNORMAL HIGH (ref 70–99)
HCT: 46 % (ref 39.0–52.0)
Hemoglobin: 15.6 g/dL (ref 13.0–17.0)
Potassium: 4 mmol/L (ref 3.5–5.1)
Sodium: 140 mmol/L (ref 135–145)
TCO2: 30 mmol/L (ref 22–32)

## 2022-05-26 LAB — LIPID PANEL
Cholesterol: 150 mg/dL (ref 0–200)
HDL: 30 mg/dL — ABNORMAL LOW (ref >40)
LDL Cholesterol: 80 mg/dL (ref 0–99)
Total CHOL/HDL Ratio: 5 RATIO
Triglycerides: 200 mg/dL — ABNORMAL HIGH (ref <150)
VLDL: 40 mg/dL (ref 0–40)

## 2022-05-26 LAB — ECHOCARDIOGRAM COMPLETE
AR max vel: 0.9 cm2
AV Area VTI: 0.99 cm2
AV Area mean vel: 0.84 cm2
AV Mean grad: 28 mmHg
AV Peak grad: 45.2 mmHg
Ao pk vel: 3.36 m/s
Area-P 1/2: 3.24 cm2
Height: 73 in
S' Lateral: 3.7 cm
Weight: 5904 oz

## 2022-05-26 LAB — HEMOGLOBIN A1C
Hgb A1c MFr Bld: 7.2 % — ABNORMAL HIGH (ref 4.8–5.6)
Mean Plasma Glucose: 159.94 mg/dL

## 2022-05-26 LAB — TSH: TSH: 1.182 u[IU]/mL (ref 0.350–4.500)

## 2022-05-26 LAB — VITAMIN B12: Vitamin B-12: 223 pg/mL (ref 180–914)

## 2022-05-26 MED ORDER — ACETAMINOPHEN 160 MG/5ML PO SOLN: 650.0000 mg | ORAL | Status: DC | PRN

## 2022-05-26 MED ORDER — ACETAMINOPHEN 325 MG PO TABS
650.0000 mg | ORAL_TABLET | ORAL | Status: DC | PRN
  Administered 2022-05-26: 650 mg via ORAL
  Filled 2022-05-26: qty 2

## 2022-05-26 MED ORDER — APIXABAN 5 MG PO TABS
5.0000 mg | ORAL_TABLET | Freq: Two times a day (BID) | ORAL | Status: DC
  Administered 2022-05-26 – 2022-05-27 (×4): 5 mg via ORAL
  Filled 2022-05-26 (×4): qty 1

## 2022-05-26 MED ORDER — LEVETIRACETAM 500 MG PO TABS
500.0000 mg | ORAL_TABLET | Freq: Two times a day (BID) | ORAL | Status: DC
  Administered 2022-05-26 – 2022-05-27 (×4): 500 mg via ORAL
  Filled 2022-05-26 (×4): qty 1

## 2022-05-26 MED ORDER — LORAZEPAM 0.5 MG PO TABS: 0.5000 mg | ORAL_TABLET | Freq: Once | ORAL | Status: DC

## 2022-05-26 MED ORDER — TRESIBA FLEXTOUCH 100 UNIT/ML ~~LOC~~ SOPN: 40.0000 [IU] | PEN_INJECTOR | Freq: Every day | SUBCUTANEOUS | 3 refills | Status: DC

## 2022-05-26 MED ORDER — STROKE: EARLY STAGES OF RECOVERY BOOK
Freq: Once | Status: AC
  Administered 2022-05-27: 1
  Filled 2022-05-26: qty 1

## 2022-05-26 MED ORDER — LACOSAMIDE 50 MG PO TABS
150.0000 mg | ORAL_TABLET | Freq: Two times a day (BID) | ORAL | Status: DC
  Administered 2022-05-26 – 2022-05-27 (×4): 150 mg via ORAL
  Filled 2022-05-26 (×4): qty 3

## 2022-05-26 MED ORDER — LORAZEPAM 2 MG/ML IJ SOLN
0.5000 mg | Freq: Once | INTRAMUSCULAR | Status: AC
  Administered 2022-05-26: 0.5 mg via INTRAVENOUS
  Filled 2022-05-26: qty 1

## 2022-05-26 MED ORDER — ATORVASTATIN CALCIUM 40 MG PO TABS
40.0000 mg | ORAL_TABLET | Freq: Every evening | ORAL | Status: DC
  Administered 2022-05-26: 40 mg via ORAL
  Filled 2022-05-26: qty 1

## 2022-05-26 MED ORDER — LORAZEPAM 2 MG/ML IJ SOLN
0.5000 mg | Freq: Once | INTRAMUSCULAR | Status: AC | PRN
  Administered 2022-05-26: 0.5 mg via INTRAVENOUS
  Filled 2022-05-26: qty 1

## 2022-05-26 MED ORDER — ACETAMINOPHEN 650 MG RE SUPP: 650.0000 mg | RECTAL | Status: DC | PRN

## 2022-05-26 MED ORDER — SODIUM CHLORIDE 0.9 % IV SOLN: INTRAVENOUS | Status: AC

## 2022-05-26 NOTE — Assessment & Plan Note (Signed)
Allow for permissive hypertension for 24 hours.  Treat SBP if >180 per neurology.  Currently normotensive.

## 2022-05-26 NOTE — Assessment & Plan Note (Signed)
-  Continue nightly BiPAP 

## 2022-05-26 NOTE — ED Notes (Signed)
Echo just left the room

## 2022-05-26 NOTE — Evaluation (Signed)
Occupational Therapy Evaluation Patient Details Name: Lawrence [name] MRN: [id number] DOB: 1956/05/01 Today's Date: 05/26/2022   History of Present Illness 66 y.o. male adm 10/18 with medical history significant of prior CVA with residual left-sided hemiparesis, morbid obesity, chronic diastolic heart failure, OSA on CPAP, type 2 diabetes, hypertension, hyperlipidemia, paroxysmal atrial fibrillation on Eliquis, suspected seizure on Keppra/Vimpat, COPD who presented with right-sided weakness, dysarthria.  CT: neg and MRI pending.   Clinical Impression   Patient admitted for the diagnosis above.  MRI pending.  PTA he lives at home with his spouse, who can provide supportive services, but not physical assist.  The patient also has a PCA for a few hours/day, 4days/wk.  He generally uses his scooter for community mobility and in home mobility, but was using a 4WRW for short distances for toileting and ADL completion.  Deficits impacting ADL and mobility are listed below.  Currently he is unable to achieve a full stand, and is essentially bedlevel for lower body ADL.  OT will follow in the acute setting, and AIR has been recommended for a consult.  Patient would benefit from post acute rehab to maximize his functional status prior to returning home.         Recommendations for follow up therapy are one component of a multi-disciplinary discharge planning process, led by the attending physician.  Recommendations may be updated based on patient status, additional functional criteria and insurance authorization.   Follow Up Recommendations  Acute inpatient rehab (3hours/day)    Assistance Recommended at Discharge Frequent or constant Supervision/Assistance  Patient can return home with the following Two people to help with walking and/or transfers;Assist for transportation;Assistance with cooking/housework;A lot of help with bathing/dressing/bathroom    Functional Status Assessment  Patient has had a  recent decline in their functional status and demonstrates the ability to make significant improvements in function in a reasonable and predictable amount of time.  Equipment Recommendations  None recommended by OT    Recommendations for Other Services Rehab consult     Precautions / Restrictions Precautions Precautions: Fall Restrictions Weight Bearing Restrictions: No      Mobility Bed Mobility Overal bed mobility: Needs Assistance Bed Mobility: Supine to Sit, Sit to Supine     Supine to sit: Mod assist Sit to supine: Mod assist   General bed mobility comments: elevate trunk and assist with feet back on the bed    Transfers Overall transfer level: Needs assistance   Transfers: Sit to/from Stand             General transfer comment: unable      Balance Overall balance assessment: Needs assistance Sitting-balance support: Bilateral upper extremity supported, Feet supported Sitting balance-Leahy Scale: Fair                                     ADL either performed or assessed with clinical judgement   ADL Overall ADL's : Needs assistance/impaired Eating/Feeding: Set up;Bed level   Grooming: Wash/dry hands;Wash/dry face;Set up;Bed level Grooming Details (indicate cue type and reason): engaging more of his L hand Upper Body Bathing: Moderate assistance;Bed level   Lower Body Bathing: Maximal assistance;Bed level   Upper Body Dressing : Minimal assistance;Bed level   Lower Body Dressing: Maximal assistance;Bed level     Toilet Transfer Details (indicate cue type and reason): unable to lift his bottom off the stretcher  Vision Patient Visual Report: No change from baseline       Perception Perception Perception: Within Functional Limits   Praxis Praxis Praxis: Intact    Pertinent Vitals/Pain Pain Assessment Pain Assessment: Faces Faces Pain Scale: No hurt Pain Intervention(s): Monitored during session      Hand Dominance Right   Extremity/Trunk Assessment Upper Extremity Assessment Upper Extremity Assessment: RUE deficits/detail RUE Deficits / Details: unable to elevate shoulder past 90 degress, can touch his nose, weak grip. RUE Sensation: WNL RUE Coordination: decreased gross motor   Lower Extremity Assessment Lower Extremity Assessment: RLE deficits/detail;LLE deficits/detail;Defer to PT evaluation   Cervical / Trunk Assessment Cervical / Trunk Assessment: Other exceptions Cervical / Trunk Exceptions: body habitus   Communication Communication Communication: No difficulties   Cognition Arousal/Alertness: Awake/alert Behavior During Therapy: WFL for tasks assessed/performed Overall Cognitive Status: Within Functional Limits for tasks assessed                                       General Comments   VSS on RA    Exercises     Shoulder Instructions      Home Living Family/patient expects to be discharged to:: Private residence Living Arrangements: Spouse/significant other Available Help at Discharge: Family;Available 24 hours/day Type of Home: House Home Access: Level entry     Home Layout: One level     Bathroom Shower/Tub: Tub/shower unit;Sponge bathes at baseline   Bathroom Toilet: Standard Bathroom Accessibility: No   Home Equipment: Transport planner (4 wheels);Grab bars - tub/shower;Hospital bed   Additional Comments: Has a PCA 4 days a week a couple hours a day      Prior Functioning/Environment Prior Level of Function : Needs assist             Mobility Comments: Rollator for household mobility (short distances); electric scooter in community dwellings. ADLs Comments: Reports some assist for ADLs from aide (bathing more than dressing). Able to complete toileting with min A.  BSC in bedroom, cannot access bathroom.        OT Problem List: Decreased strength;Decreased range of motion;Decreased activity  tolerance;Impaired balance (sitting and/or standing);Impaired UE functional use;Obesity;Impaired sensation      OT Treatment/Interventions: Self-care/ADL training;Therapeutic exercise;Therapeutic activities;Patient/family education;DME and/or AE instruction;Balance training    OT Goals(Current goals can be found in the care plan section) Acute Rehab OT Goals Patient Stated Goal: Would perfer to go home with [facility] OT Goal Formulation: With patient Time For Goal Achievement: 06/09/22 Potential to Achieve Goals: Fair ADL Goals Pt Will Perform Grooming: with set-up;sitting Pt Will Perform Upper Body Dressing: with set-up;sitting Pt Will Perform Lower Body Dressing: with min assist;sitting/lateral leans Pt Will Transfer to Toilet: with mod assist;stand pivot transfer;bedside commode Pt/caregiver will Perform Home Exercise Program: Increased strength;Both right and left upper extremity;With theraband;With minimal assist  OT Frequency: Min 2X/week    Co-evaluation              AM-PAC OT "6 Clicks" Daily Activity     Outcome Measure Help from another person eating meals?: A Little Help from another person taking care of personal grooming?: A Little Help from another person toileting, which includes using toliet, bedpan, or urinal?: A Lot Help from another person bathing (including washing, rinsing, drying)?: A Lot Help from another person to put on and taking off regular upper body clothing?: A Lot Help from another person to put on  and taking off regular lower body clothing?: A Lot 6 Click Score: 14   End of Session Equipment Utilized During Treatment: Rolling walker (2 wheels);Gait belt Nurse Communication: Mobility status  Activity Tolerance: Patient tolerated treatment well Patient left: in bed;with call bell/phone within reach  OT Visit Diagnosis: Unsteadiness on feet (R26.81);Muscle weakness (generalized) (M62.81);Hemiplegia and hemiparesis Hemiplegia - Right/Left:  Right Hemiplegia - dominant/non-dominant: Dominant Hemiplegia - caused by: Unspecified (CT neg and MRI pending)                Time: 9558-3167 OT Time Calculation (min): 23 min Charges:  OT General Charges $OT Visit: 1 Visit OT Evaluation $OT Eval Moderate Complexity: 1 Mod OT Treatments $Self Care/Home Management : 8-22 mins  05/26/2022  RP, OTR/L  Acute Rehabilitation Services  Office:  [phone]   Metta Clines 05/26/2022, 4:25 PM

## 2022-05-26 NOTE — Assessment & Plan Note (Signed)
Continue statin. 

## 2022-05-26 NOTE — Assessment & Plan Note (Signed)
-  Hx of recurrent strokes with left-sided residual hemiparesis on Eliquis.  Denies any missed doses other than today.  Continues to have persistent right-sided weakness on exam. -CTA head negative.  CTA head and neck negative for LVO. -- Obtain MRI brain.  Patient has spinal cord stimulator but has remote to turn off. -Obtain echocardiogram  -Continue Eliquis per neurology -Obtain A1c and lipids -PT/OT/SLT -Frequent neuro checks and keep on telemetry -Allow for permissive hypertension with blood pressure treatment as needed only if systolic goes [id number]

## 2022-05-26 NOTE — ED Notes (Signed)
Messaged md about diet order

## 2022-05-26 NOTE — ED Notes (Signed)
Verbal report given to Teresita Madura RN at this time

## 2022-05-26 NOTE — Progress Notes (Signed)
SLP Cancellation Note  Patient Details Name: SAAGAR TORTORELLA MRN: [id number] DOB: February 05, 1956   Cancelled treatment:        Attempted to see pt for cognitive-linguistic assessment.  Pt undergoing additional testing in room at time of attempt.  SLP will return as schedule permits.    Celedonio Savage, MA, [place] Office: [phone] 05/26/2022, 10:19 AM

## 2022-05-26 NOTE — Progress Notes (Addendum)
STROKE TEAM PROGRESS NOTE   INTERVAL HISTORY Patient was seen in bed this AM. He reports having difficulty recalling the episode that brought him to the hospital. He reports initially having right arm and leg weakness and then his left side started becoming weak as well. At this time, he still feels the weakness. He also notes some shaking during this episode which occurred three times but he was not sure how long the shaking lasted.  Called [name] Ayott (significant other) and she notes patient has a history of involuntary hand shaking. Yesterday afternoon, she noticed him having difficulty smiling and moving his right side.  Vitals:   05/26/22 0600 05/26/22 0700 05/26/22 0730 05/26/22 0830  BP: (!) 103/51 116/67 (!) 101/58 106/67  Pulse: 79 82 80 82  Resp: 14 (!) '23 15 13  '$ Temp:      TempSrc:      SpO2: 93% 91% 94% 93%  Weight:      Height:       CBC:  Recent Labs  Lab 05/25/22 2101 05/25/22 2110  WBC 11.0*  --   NEUTROABS 8.1*  --   HGB 15.1 15.6  HCT 46.7 46.0  MCV 90.9  --   PLT 243  --    Basic Metabolic Panel:  Recent Labs  Lab 05/25/22 2101 05/25/22 2110  NA 140 140  K 4.0 4.0  CL 100 99  CO2 29  --   GLUCOSE 175* 173*  BUN 25* 30*  CREATININE 1.17 1.20  CALCIUM 9.1  --    Lipid Panel:  Recent Labs  Lab 05/26/22 0205  CHOL 150  TRIG 200*  HDL 30*  CHOLHDL 5.0  VLDL 40  LDLCALC 80   HgbA1c:  Recent Labs  Lab 05/26/22 0812  HGBA1C 7.2*   Urine Drug Screen: No results for input(s): "LABOPIA", "COCAINSCRNUR", "LABBENZ", "AMPHETMU", "THCU", "LABBARB" in the last 168 hours.  Alcohol Level  Recent Labs  Lab 05/25/22 2101  ETH <10    IMAGING past 24 hours CT ANGIO HEAD NECK W WO CM (CODE STROKE)  Result Date: 05/25/2022 CLINICAL DATA:  Initial evaluation for neuro deficit, stroke suspected. Right-sided weakness. EXAM: CT ANGIOGRAPHY HEAD AND NECK TECHNIQUE: Multidetector CT imaging of the head and neck was performed using the standard protocol  during bolus administration of intravenous contrast. Multiplanar CT image reconstructions and MIPs were obtained to evaluate the vascular anatomy. Carotid stenosis measurements (when applicable) are obtained utilizing NASCET criteria, using the distal internal carotid diameter as the denominator. RADIATION DOSE REDUCTION: This exam was performed according to the departmental dose-optimization program which includes automated exposure control, adjustment of the mA and/or kV according to patient size and/or use of iterative reconstruction technique. CONTRAST:  70m OMNIPAQUE IOHEXOL 350 MG/ML SOLN COMPARISON:  Comparison made with prior CT from earlier the same day as well as recent CTA from 02/25/2022. FINDINGS: CTA NECK FINDINGS Aortic arch: Visualized aortic arch normal in caliber with normal branch pattern. Mild atheromatous change about the arch itself. No hemodynamically significant stenosis about the origin the great vessels. Right carotid system: Right common and internal carotid arteries are tortuous and partially medialized into the retropharyngeal space. Mild atheromatous change about the right carotid bulb without hemodynamically significant greater than 50% stenosis. No dissection or occlusion. Left carotid system: Left common and internal carotid arteries are tortuous and partially medialized into the retropharyngeal space. Atheromatous change about the left carotid bulb without hemodynamically significant greater than 50% stenosis. No dissection or occlusion. Vertebral arteries:  Both vertebral arteries arise from the subclavian arteries. No proximal subclavian artery stenosis. Both vertebral arteries widely patent without stenosis, dissection or occlusion. Skeleton: No discrete or worrisome osseous lesions. Moderate spondylosis present at C5-6 through C7-T1. Other neck: No other acute soft tissue abnormality within the neck. Upper chest: Dependent atelectatic changes noted within the visualized lungs.  Visualized upper chest demonstrates no other acute finding. Review of the MIP images confirms the above findings CTA HEAD FINDINGS Anterior circulation: Petrous segments widely patent. Mild atheromatous change within the carotid siphons without significant stenosis. A1 segments, anterior communicating complex common anterior cerebral arteries widely patent. No M1 stenosis or occlusion. No proximal MCA branch occlusion or high-grade stenosis. Distal MCA branches perfused and symmetric. Posterior circulation: Both V4 segments widely patent to the vertebrobasilar junction without stenosis. Both PICA patent. Basilar patent to its distal aspect without stenosis. Superior cerebellar and posterior cerebral arteries widely patent bilaterally. Venous sinuses: Patent allowing for timing the contrast bolus. Anatomic variants: None significant.  No aneurysm. Review of the MIP images confirms the above findings IMPRESSION: 1. Negative CTA for large vessel occlusion or other emergent finding. 2. Mild atheromatous change about the aortic arch, carotid bifurcations and carotid siphons without hemodynamically significant stenosis. Case discussed by telephone at the time of interpretation on 05/25/2022 at 9:48 p.m. to provider ERIC LINDZEN. Electronically Signed   By: Jeannine Boga M.D.   On: 05/25/2022 22:07   CT HEAD CODE STROKE WO CONTRAST  Result Date: 05/25/2022 CLINICAL DATA:  Code stroke.  Neuro deficit, acute, stroke suspected EXAM: CT HEAD WITHOUT CONTRAST TECHNIQUE: Contiguous axial images were obtained from the base of the skull through the vertex without intravenous contrast. RADIATION DOSE REDUCTION: This exam was performed according to the departmental dose-optimization program which includes automated exposure control, adjustment of the mA and/or kV according to patient size and/or use of iterative reconstruction technique. COMPARISON:  CT head 02/24/2022. FINDINGS: Brain: No evidence of acute large  vascular territory infarction, hemorrhage, hydrocephalus, extra-axial collection or mass lesion/mass effect. Vascular: No hyperdense vessel identified. Skull: No acute fracture. Sinuses/Orbits: Clear sinuses.  No acute orbital findings. Other: No mastoid effusions. ASPECTS [facility] Stroke Program Early CT Score) total score (0-10 with 10 being normal): 10. IMPRESSION: No evidence of acute intracranial abnormality. ASPECTS is 10. Code stroke imaging results were communicated on 05/25/2022 at 9:23 pm to provider Dr. Cheral Marker via secure text paging. Electronically Signed   By: Margaretha Sheffield M.D.   On: 05/25/2022 21:24    PHYSICAL EXAM General: Pleasant, well-appearing obese man in bed. No acute distress. Skin: Warm and dry. No obvious rash or lesions. Neuro: A&Ox3. CN II-XII intact. No cerebellar abnormalities. Moves all extremities. Normal ROM. 5/5 strength LUE. 4/5 strength RUE, 3/5 strength RLE, and LLE. Asterixis seen bilaterally in arms, right side more pronounced than left. Sensation equal in face and arms. Inconsistent sensation in the lower extremities. Psych: Normal mood and affect   ASSESSMENT/PLAN  Lawrence [name] is a 66 y.o. male with medical history significant of prior CVA with residual left-sided hemiparesis, morbid obesity, chronic diastolic heart failure, OSA on CPAP, type 2 diabetes, hypertension, hyperlipidemia, paroxysmal atrial fibrillation on Eliquis, suspected seizure on Keppra/Vimpat, COPD who presents with right-sided weakness, dysarthria.  Likely recrudescence of old deficits in the setting of encephalopathy, hypotension Code Stroke CT head No acute abnormality. ASPECTS 10.    CTA head & neck no LVO MRI  pending 2D Echo EF 60 to 65% EEG normal LDL 80 HgbA1c  7.5 VTE prophylaxis - eliquis aspirin 81 mg daily and Eliquis (apixaban) daily prior to admission, now on Eliquis. Therapy recommendations: CIR Disposition:  home  History of seizure-like activity 10/2020  admitted for CHF, SOB and LLE cellulitis.  Reported seizure-like activity lasting 30 seconds with bilateral upper extremity jerking.  MRI/EEG negative.  Put on Keppra 1 g twice daily. Keppra continued for 1 months and self stopped 02/2021 admitted for left LE weakness and lethargy.  MRI showed no acute infarct.  MRI C-spine showed spinal stenosis, no spinal cord compression.  Discharged on outpatient PT/OT with plan for EMS/NCS as outpatient. 04/2021 to follow-up with Dr. April Manson at [facility] 04/2021 admitted for headache and left-sided weakness.  MRI and EEG negative. 08/2021 admitted for right hand jerking EEG negative.  TEE no PFO.  Put on Keppra 1 g twice daily 09/2021 follow with Dr. April Manson, decrease her Keppra XR 1 g daily 02/2022 admitted for left-sided weakness/numbness and headache.  CT no acute abnormality.  CT head and neck unremarkable.  MRI no acute infarct.  EF 60 to 65%, EEG negative.  LDL 62, A1c 8.0.  Concerning?  Partial seizure versus recrudescence from previous stroke.  Discharged on Keppra 500 twice daily and Vimpat 150 twice daily as well as aspirin 81 and Eliquis.  Paroxysmal Atrial Fibrillation On Eliquis Rate controlled Continue Eliquis  Hypotension History of hypertension Home meds:  cardizem, metoprolol  Stable, on the low end Hold off BP meds for now Long-term BP goal normotensive  Hyperlipidemia Home meds: lipitor 40, resumed in hospital LDL 80, goal < 70 Continue statin at discharge  Diabetes type II Uncontrolled Home meds:  metformin, insulin HgbA1c 7.2, goal < 7.0 CBGs SSI Close PCP follow-up for better DM control  Other Stroke Risk Factors Advanced Age >/= 65  ETOH use, alcohol level <10, advised to drink no more than 2 drink(s) a day Obesity, Body mass index is 48.68 kg/m., BMI >/= 30 associated with increased stroke risk, recommend weight loss, diet and exercise as appropriate  Coronary artery disease Obstructive sleep apnea, on CPAP at home  Other  Active Problems COPD   Hospital day # 0  Stormy Fabian, MD PGY-1 Psychiatry  ATTENDING NOTE: I reviewed above note and agree with the assessment and plan. Pt was seen and examined.   66 year old male with history of PAF on Eliquis and aspirin, hypertension, diabetes, CAD, OSA on CPAP, seizure-like activity on Keppra and Vimpat, COPD, morbid obesity admitted for left-sided headache, bilateral upper extremity weakness, lethargy, bilateral arm tremulous.  Patient is not a good historian, however currently much improved per patient.  CT no acute abnormality.  CT head and neck unremarkable.  MRI pending, EEG normal.  EF 60 to 65%, LDL 80, A1c 7.2, creatinine 1.17, WBC 11.0, UA negative.  BP on the low end.  On exam, AOx3, no gaze palsy, visual field full, no facial droop, no aphasia follows some commands.  Bilateral upper extremity at least 4/5, bilateral lower extremity 3 -/5.  Sensation seems decreased on the right but inconsistent with repetitive testing.  Bilateral finger-nose grossly intact.  Bilateral upper extremity asterixis, right more than left.  Etiology for patient's symptoms not quite clear, currently MRI pending.  Concerning for recrudescence from previous symptoms in the setting of encephalopathy and hypotension.  Recommend hold off BP meds at that time, close BP monitoring.  Treat underlying condition, continue Eliquis, Keppra and Vimpat.  May resume aspirin after MRI.  Continue statin.  PT/OT recommend CIR.  Will follow.  For detailed assessment and plan, please refer to above/below as I have made changes wherever appropriate.   Rosalin Hawking, MD PhD Stroke Neurology 05/26/2022 7:01 PM     To contact Stroke Continuity provider, please refer to [email or web address] After hours, contact General Neurology

## 2022-05-26 NOTE — Assessment & Plan Note (Signed)
Continue Eliquis Continue Toprol XL and diltiazem

## 2022-05-26 NOTE — ED Notes (Signed)
Pt alert and oriented. Reports continuing weakness in right upper and lower extremities and mild headache. Speech clear.

## 2022-05-26 NOTE — ED Notes (Signed)
Pt placed on bedpan for bowel movement. Pt weak, but able to lift for pan placement. Pt cleaned resting at this time.

## 2022-05-26 NOTE — Procedures (Signed)
Patient Name: Lawrence [name]  MRN: [id number]  Epilepsy Attending: Lora Havens  Referring Physician/Provider: Rosalin Hawking, MD  Date: 05/26/2022 Duration: 22.15 mins  Patient history: 66 year old male with a history of paroxysmal atrial fibrillation on Eliquis, seizures, 3 prior strokes and chronic left sided weakness secondary to prior stroke, who presents with acute onset of right sided weakness, right sided sensory loss and dysarthria. EEG to evaluate for seizure.  Level of alertness: Awake, asleep  AEDs during EEG study: LEV, LCM  Technical aspects: This EEG study was done with scalp electrodes positioned according to the 10-20 International system of electrode placement. Electrical activity was reviewed with band pass filter of 1-'70Hz'$ , sensitivity of 7 uV/mm, display speed of 51m/sec with a '60Hz'$  notched filter applied as appropriate. EEG data were recorded continuously and digitally stored.  Video monitoring was available and reviewed as appropriate.  Description: The posterior dominant rhythm consists of 8 Hz activity of moderate voltage (25-35 uV) seen predominantly in posterior head regions, symmetric and reactive to eye opening and eye closing. Sleep was characterized by vertex waves, sleep spindles (12 to 14 Hz), maximal frontocentral region.  Hyperventilation and photic stimulation were not performed.     IMPRESSION: This study is within normal limits. No seizures or epileptiform discharges were seen throughout the recording.  A normal interictal EEG does not exclude the diagnosis of epilepsy.  Lawrence [name] [name]

## 2022-05-26 NOTE — Assessment & Plan Note (Signed)
Last hemoglobin A1c of 7.4 in August -Placed on sensitive sliding scale

## 2022-05-26 NOTE — Evaluation (Signed)
Speech Language Pathology Evaluation Patient Details Name: Lawrence [name] MRN: [id number] DOB: 01-22-1956 Today's Date: 05/26/2022 Time: 8315-1761 SLP Time Calculation (min) (ACUTE ONLY): 30 min  Problem List:  Patient Active Problem List   Diagnosis Date Noted   Right sided weakness 05/26/2022   Dysarthria 05/25/2022   Ischemic stroke ([place]) 02/25/2022   Hemiparesis affecting left side as late effect of cerebrovascular accident (CVA) ([place]) 02/25/2022   Durable power of attorney for healthcare exists  02/25/2022   Depression 08/07/2021   Paronychia of great toe, left 07/07/2021   Stroke-like symptoms 04/27/2021   Ambulatory dysfunction 04/27/2021   Left-sided weakness 02/09/2021   Atrial fibrillation with rapid ventricular response ([place]) 02/08/2021   AF (paroxysmal atrial fibrillation) ([place]) 01/25/2021   COPD (chronic obstructive pulmonary disease) ([place]) 01/25/2021   Moderate aortic stenosis 01/21/2021   Acquired dilation of ascending aorta and aortic root ([place]) 01/21/2021   Coronary artery disease due to lipid rich plaque    SVT (supraventricular tachycardia)    Seizure ([place])    Essential hypertension 10/17/2020   Hypokalemia 10/17/2020   Cellulitis of right leg 12/04/2019   Pain due to onychomycosis of toenails of both feet 09/11/2019   Diabetic neuropathy ([place]) 09/11/2019   Type 2 diabetes mellitus with diabetic polyneuropathy, with long-term current use of insulin ([place]) 09/11/2019   Sinus tachycardia 03/07/2019   Chronic venous stasis 03/07/2019   History of anemia 12/26/2018   Anemia 08/24/2018   History of colon polyps 08/24/2018   Do not resuscitate status 08/24/2018   Chronic diastolic CHF (congestive heart failure) ([place]) 08/18/2018   Coronary artery calcification seen on CAT scan    Pure hypercholesterolemia    Shortness of breath    Right foot pain 01/13/2018   Diabetes mellitus type 2 in obese ([place]) 01/13/2018   Lower extremity cellulitis 11/07/2017    Primary osteoarthritis, left shoulder 03/05/2017   BPH without obstruction/lower urinary tract symptoms 02/22/2017   Hematuria 02/22/2017   Peripheral neuropathy 02/22/2017   Physical deconditioning 02/22/2017   Morbid obesity ([place])    PTSD (post-traumatic stress disorder)    Anasarca 01/31/2017   Bilateral lower extremity edema 01/31/2017   OSA (obstructive sleep apnea)    Past Medical History:  Past Medical History:  Diagnosis Date   Acquired dilation of ascending aorta and aortic root (HCC)    15m by echo 01/2021   Adenomatous colon polyp 2007   Anemia    Anxiety    Aortic stenosis    moderate AS by echo 01/2021   Asthma    BPH without obstruction/lower urinary tract symptoms 02/22/2017   Chronic diastolic (congestive) heart failure (HCC)    Chronic venous stasis 03/07/2019   COPD (chronic obstructive pulmonary disease) (HCC)    Coronary artery calcification seen on CAT scan    Depression    Diabetic neuropathy ([place] 09/11/2019   History of colon polyps 08/24/2018   Hypertension    Morbid obesity (HCC)    OSA (obstructive sleep apnea)    Pain due to onychomycosis of toenails of both feet 09/11/2019   Peripheral neuropathy 02/22/2017   Primary osteoarthritis, left shoulder 03/05/2017   PTSD (post-traumatic stress disorder)    Pure hypercholesterolemia    QT prolongation 03/07/2019   Seizures (HCC)    Shortness of breath    Sinus tachycardia 03/07/2019   Sleep apnea    CPAP   Type 2 diabetes mellitus with vascular disease ([place] 09/11/2019   Past Surgical History:  Past Surgical History:  Procedure Laterality Date   ENDOVENOUS ABLATION SAPHENOUS VEIN W/ LASER Right 08/20/2020   endovenous laser ablation right greater saphenous vein by Gae Gallop MD    JOINT REPLACEMENT     left knee replacement x 2   KNEE ARTHROSCOPY Bilateral    LEFT HEART CATH AND CORONARY ANGIOGRAPHY N/A 01/17/2018   Procedure: LEFT HEART CATH AND CORONARY ANGIOGRAPHY;  Surgeon: Leonie Man, MD;  Location: [facility];  Service: Cardiovascular;  Laterality: N/A;   PRESSURE SENSOR/CARDIOMEMS N/A 08/26/2021   Procedure: PRESSURE SENSOR/CARDIOMEMS;  Surgeon: Larey Dresser, MD;  Location: [facility];  Service: Cardiovascular;  Laterality: N/A;   RIGHT HEART CATH N/A 08/26/2021   Procedure: RIGHT HEART CATH;  Surgeon: Larey Dresser, MD;  Location: [facility];  Service: Cardiovascular;  Laterality: N/A;   TEE WITHOUT CARDIOVERSION N/A 08/26/2021   Procedure: TRANSESOPHAGEAL ECHOCARDIOGRAM (TEE);  Surgeon: Larey Dresser, MD;  Location: [facility] ENDOSCOPY;  Service: Cardiovascular;  Laterality: N/A;   UMBILICAL HERNIA REPAIR     HPI:  Lawrence [name] is a 66 y.o. male who presented with right-sided weakness, dysarthria. Head CT negative, MRI pending.  Pt  with medical history significant of prior CVA with residual left-sided hemiparesis, morbid obesity, chronic diastolic heart failure, OSA on CPAP, type 2 diabetes, hypertension, hyperlipidemia, paroxysmal atrial fibrillation on Eliquis, suspected seizure on Keppra/Vimpat, COPD   Assessment / Plan / Recommendation Clinical Impression  Pt presents with normal cognitive function as assessed by the COGNISTAT (see below for additional information).  Pt performed within the average range on all subtests administered; however, pt reports a subjective change in his abilities which he describes as getting the answer from his brain to his mouth.  With probing questions, it sound like this is an increased processing time issue rather than a motor planning issue.  Pt seems to have very high cognitive function at baseline and today's assessment may be insensitive to these new changes.  Pt pointed out an instance in conversation where he was relaying a story and noticed difficutly with word finding.  He was able to compensate.  SLP would not have noted difference, had pt not been able to point it out.  Pt reports hx of dyslexia,  although it was not detected until sophmore year of college (suspect his high cognitive reserve contributed to this delay).    Pt's speech is clear and dysarthria has resolved.  SLP will follow in house for above noted deficits. Recommend outpatient SLP v neuropsychology referral for high level cognitive assessment at discharge.  COGNISTAT: All subtests are within the average range, except where otherwise specified.  Orientation:  10/12 Attention: 7/8 Comprehension: 5/6 Repetition: /12 Naming: 12/8 Construction: not assessed Memory: 12/12 Calculations: 4/4 Similarities: 7/8 Judgment: 6/6    SLP Assessment  SLP Recommendation/Assessment: Patient needs continued Speech Lanaguage Pathology Services SLP Visit Diagnosis: Cognitive communication deficit (R41.841)    Recommendations for follow up therapy are one component of a multi-disciplinary discharge planning process, led by the attending physician.  Recommendations may be updated based on patient status, additional functional criteria and insurance authorization.    Follow Up Recommendations  Outpatient SLP    Assistance Recommended at Discharge   (Follow physician, PT/OT recommendations)  Functional Status Assessment Patient has had a recent decline in their functional status and demonstrates the ability to make significant improvements in function in a reasonable and predictable amount of time.  Frequency and Duration min 2x/week  2  weeks      SLP Evaluation Cognition  Overall Cognitive Status: Impaired/Different from baseline Orientation Level: Oriented X4 Month: October Day of Week: Correct Attention: Focused;Sustained Sustained Attention: Appears intact Memory: Appears intact Awareness: Appears intact Problem Solving: Appears intact Executive Function: Reasoning Reasoning: Appears intact       Comprehension  Auditory Comprehension Overall Auditory Comprehension: Appears within functional limits for tasks  assessed Commands: Within Functional Limits Conversation: Complex Visual Recognition/Discrimination Discrimination: Not tested Reading Comprehension Reading Status: Not tested    Expression Expression Primary Mode of Expression: Verbal Verbal Expression Overall Verbal Expression: Appears within functional limits for tasks assessed Initiation: No impairment Automatic Speech: Name Level of Generative/Spontaneous Verbalization: Word Repetition: No impairment Naming: No impairment Written Expression Dominant Hand: Right Written Expression: Not tested   Oral / Motor  Motor Speech Overall Motor Speech: Appears within functional limits for tasks assessed Respiration: Within functional limits Phonation: Normal Resonance: Within functional limits Articulation: Within functional limitis Intelligibility: Intelligible Motor Planning: Witnin functional limits Motor Speech Errors: Not applicable            Celedonio Savage, MA, [place] Office: [phone] 05/26/2022, 1:14 PM

## 2022-05-26 NOTE — Assessment & Plan Note (Signed)
BMI of 48 

## 2022-05-26 NOTE — Progress Notes (Signed)
PROGRESS NOTE    NICLAS MARKELL  NTI:[id number] DOB: 1955-11-14 DOA: 05/25/2022 PCP: [name], PA-C   Brief Narrative:  Lawrence [name] is a 66 y.o. male with medical history significant of prior CVA with residual left-sided hemiparesis, morbid obesity, chronic diastolic heart failure, OSA on CPAP, type 2 diabetes, hypertension, hyperlipidemia, paroxysmal atrial fibrillation on Eliquis, suspected seizure on Keppra/Vimpat, COPD who presents with right-sided weakness, dysarthria.  Recently hospitalized from 02/24/2022 to 02/26/2022 with shaking of arm, left facial droop.  CVA work-up was negative with CT head, CTA head and neck, MRI brain.  Neurology was consulted at that time and thought likely due to a breakthrough seizure.  Vimpat was added in addition to his home Keppra.  For CVA prophylaxis, patient advised to continue Eliquis and also had aspirin 81 mg daily added.    Assessment & Plan:   Principal Problem:   Right sided weakness Active Problems:   OSA (obstructive sleep apnea)   Morbid obesity (HCC)   Diabetes mellitus type 2 in obese (HCC)   Chronic diastolic CHF (congestive heart failure) (HCC)   AF (paroxysmal atrial fibrillation) (HCC)   Pure hypercholesterolemia   Essential hypertension   Seizure ([place])   Dysarthria  Right sided weakness  Transient episode, rule out acute CVA versus TIA -Hx of recurrent strokes with left-sided residual hemiparesis on Eliquis.  Denies any missed doses other than today.  Continues to have persistent right-sided weakness on exam. -CTA head negative.  CTA head and neck negative for LVO. -Currently awaiting MRI, patient has known renal stimulator but has remote to turn this device off during MRI -Continue Eliquis per neurology -Continue PT OT speech, appears to be back at baseline -Frequent neuro checks and keep on telemetry -Allow for permissive hypertension with blood pressure treatment as needed only if systolic goes [id number]    AF (paroxysmal atrial fibrillation) (HCC) Continue Eliquis Continue Toprol XL and diltiazem   Chronic diastolic CHF (congestive heart failure) (HCC) Stable.  NYHA II -Holding diuretics overnight to allow for gentle fluid hydration overnight.  Will resume torsemide and spironolactone in the morning   Diabetes mellitus type 2 in obese (HCC) Last hemoglobin A1c of 7.4 in August -Placed on sensitive sliding scale   Morbid obesity (HCC) BMI of 48   OSA (obstructive sleep apnea) Continue nightly BiPAP -patient was unable to secure BiPAP overnight as such was placed on oxygen transiently.   Seizure ([place]) History of seizure.  Low suspicion of breakthrough seizures with current symptoms.  Continue Keppra and Vimpat.   Essential hypertension Allow for permissive hypertension for 24 hours.  Treat SBP if >180 per neurology.  Currently normotensive.   Pure hypercholesterolemia Continue statin  DVT prophylaxis: Eliquis Code Status: DNR Family Communication: None available  Status is: Observation  Dispo: The patient is from: Home              Anticipated d/c is to: Home              Anticipated d/c date is: 24 hours              Patient currently not medically stable for discharge patient continues to await imaging  Consultants:  Neurology  Procedures:  None  Antimicrobials:  None  Subjective: No acute issues or events overnight, reports he is back to baseline  Objective: Vitals:   05/26/22 0200 05/26/22 0300 05/26/22 0539 05/26/22 0600  BP: 106/72 (!) 110/58  (!) 103/51  Pulse: 79 80  79  Resp: '17 16  14  '$ Temp:   98 F (36.7 C)   TempSrc:   Oral   SpO2: 90% 92%  93%  Weight:      Height:       No intake or output data in the 24 hours ending 05/26/22 0819 Filed Weights   05/25/22 2150  Weight: (!) 167.4 kg    Examination:  General:  Pleasantly resting in bed, No acute distress. HEENT:  Normocephalic atraumatic.  Sclerae nonicteric, noninjected.  Extraocular  movements intact bilaterally. Neck:  Without mass or deformity.  Trachea is midline. Lungs:  Clear to auscultate bilaterally without rhonchi, wheeze, or rales. Heart:  Regular rate and rhythm.  Without murmurs, rubs, or gallops. Abdomen:  Soft, nontender, nondistended.  Without guarding or rebound. Extremities: Without cyanosis, clubbing, edema, or obvious deformity. Vascular:  Dorsalis pedis and posterior tibial pulses palpable bilaterally.  Data Reviewed: I have personally reviewed following labs and imaging studies  CBC: Recent Labs  Lab 05/25/22 2101 05/25/22 2110  WBC 11.0*  --   NEUTROABS 8.1*  --   HGB 15.1 15.6  HCT 46.7 46.0  MCV 90.9  --   PLT 243  --    Basic Metabolic Panel: Recent Labs  Lab 05/25/22 2101 05/25/22 2110  NA 140 140  K 4.0 4.0  CL 100 99  CO2 29  --   GLUCOSE 175* 173*  BUN 25* 30*  CREATININE 1.17 1.20  CALCIUM 9.1  --    GFR: Estimated Creatinine Clearance: 99.7 mL/min (by C-G formula based on SCr of 1.2 mg/dL). Liver Function Tests: Recent Labs  Lab 05/25/22 2101  AST 14*  ALT 18  ALKPHOS 78  BILITOT 0.3  PROT 6.9  ALBUMIN 3.7   No results for input(s): "LIPASE", "AMYLASE" in the last 168 hours. No results for input(s): "AMMONIA" in the last 168 hours. Coagulation Profile: Recent Labs  Lab 05/25/22 2101  INR 1.0   Cardiac Enzymes: No results for input(s): "CKTOTAL", "CKMB", "CKMBINDEX", "TROPONINI" in the last 168 hours. BNP (last 3 results) No results for input(s): "PROBNP" in the last 8760 hours. HbA1C: No results for input(s): "HGBA1C" in the last 72 hours. CBG: Recent Labs  Lab 05/25/22 2102  GLUCAP 187*   Lipid Profile: Recent Labs    05/26/22 0205  CHOL 150  HDL 30*  LDLCALC 80  TRIG 200*  CHOLHDL 5.0   Thyroid Function Tests: No results for input(s): "TSH", "T4TOTAL", "FREET4", "T3FREE", "THYROIDAB" in the last 72 hours. Anemia Panel: No results for input(s): "VITAMINB12", "FOLATE", "FERRITIN",  "TIBC", "IRON", "RETICCTPCT" in the last 72 hours. Sepsis Labs: No results for input(s): "PROCALCITON", "LATICACIDVEN" in the last 168 hours.  No results found for this or any previous visit (from the past 240 hour(s)).       Radiology Studies: CT ANGIO HEAD NECK W WO CM (CODE STROKE)  Result Date: 05/25/2022 CLINICAL DATA:  Initial evaluation for neuro deficit, stroke suspected. Right-sided weakness. EXAM: CT ANGIOGRAPHY HEAD AND NECK TECHNIQUE: Multidetector CT imaging of the head and neck was performed using the standard protocol during bolus administration of intravenous contrast. Multiplanar CT image reconstructions and MIPs were obtained to evaluate the vascular anatomy. Carotid stenosis measurements (when applicable) are obtained utilizing NASCET criteria, using the distal internal carotid diameter as the denominator. RADIATION DOSE REDUCTION: This exam was performed according to the departmental dose-optimization program which includes automated exposure control, adjustment of the mA and/or kV according to patient size and/or use of iterative  reconstruction technique. CONTRAST:  5m OMNIPAQUE IOHEXOL 350 MG/ML SOLN COMPARISON:  Comparison made with prior CT from earlier the same day as well as recent CTA from 02/25/2022. FINDINGS: CTA NECK FINDINGS Aortic arch: Visualized aortic arch normal in caliber with normal branch pattern. Mild atheromatous change about the arch itself. No hemodynamically significant stenosis about the origin the great vessels. Right carotid system: Right common and internal carotid arteries are tortuous and partially medialized into the retropharyngeal space. Mild atheromatous change about the right carotid bulb without hemodynamically significant greater than 50% stenosis. No dissection or occlusion. Left carotid system: Left common and internal carotid arteries are tortuous and partially medialized into the retropharyngeal space. Atheromatous change about the left  carotid bulb without hemodynamically significant greater than 50% stenosis. No dissection or occlusion. Vertebral arteries: Both vertebral arteries arise from the subclavian arteries. No proximal subclavian artery stenosis. Both vertebral arteries widely patent without stenosis, dissection or occlusion. Skeleton: No discrete or worrisome osseous lesions. Moderate spondylosis present at C5-6 through C7-T1. Other neck: No other acute soft tissue abnormality within the neck. Upper chest: Dependent atelectatic changes noted within the visualized lungs. Visualized upper chest demonstrates no other acute finding. Review of the MIP images confirms the above findings CTA HEAD FINDINGS Anterior circulation: Petrous segments widely patent. Mild atheromatous change within the carotid siphons without significant stenosis. A1 segments, anterior communicating complex common anterior cerebral arteries widely patent. No M1 stenosis or occlusion. No proximal MCA branch occlusion or high-grade stenosis. Distal MCA branches perfused and symmetric. Posterior circulation: Both V4 segments widely patent to the vertebrobasilar junction without stenosis. Both PICA patent. Basilar patent to its distal aspect without stenosis. Superior cerebellar and posterior cerebral arteries widely patent bilaterally. Venous sinuses: Patent allowing for timing the contrast bolus. Anatomic variants: None significant.  No aneurysm. Review of the MIP images confirms the above findings IMPRESSION: 1. Negative CTA for large vessel occlusion or other emergent finding. 2. Mild atheromatous change about the aortic arch, carotid bifurcations and carotid siphons without hemodynamically significant stenosis. Case discussed by telephone at the time of interpretation on 05/25/2022 at 9:48 p.m. to provider ERIC LINDZEN. Electronically Signed   By: BJeannine BogaM.D.   On: 05/25/2022 22:07   CT HEAD CODE STROKE WO CONTRAST  Result Date: 05/25/2022 CLINICAL  DATA:  Code stroke.  Neuro deficit, acute, stroke suspected EXAM: CT HEAD WITHOUT CONTRAST TECHNIQUE: Contiguous axial images were obtained from the base of the skull through the vertex without intravenous contrast. RADIATION DOSE REDUCTION: This exam was performed according to the departmental dose-optimization program which includes automated exposure control, adjustment of the mA and/or kV according to patient size and/or use of iterative reconstruction technique. COMPARISON:  CT head 02/24/2022. FINDINGS: Brain: No evidence of acute large vascular territory infarction, hemorrhage, hydrocephalus, extra-axial collection or mass lesion/mass effect. Vascular: No hyperdense vessel identified. Skull: No acute fracture. Sinuses/Orbits: Clear sinuses.  No acute orbital findings. Other: No mastoid effusions. ASPECTS ([facility] Early CT Score) total score (0-10 with 10 being normal): 10. IMPRESSION: No evidence of acute intracranial abnormality. ASPECTS is 10. Code stroke imaging results were communicated on 05/25/2022 at 9:23 pm to provider Dr. LCheral Markervia secure text paging. Electronically Signed   By: FMargaretha SheffieldM.D.   On: 05/25/2022 21:24    Scheduled Meds:  [START ON 05/27/2022]  stroke: early stages of recovery book   Does not apply Once   apixaban  5 mg Oral BID   atorvastatin  40 mg  Oral QPM   lacosamide  150 mg Oral BID   levETIRAcetam  500 mg Oral BID   sodium chloride flush  3 mL Intravenous Once   Continuous Infusions:  sodium chloride 75 mL/hr at 05/26/22 0041    LOS: 0 days   Time spent: 74mn  Dametrius Sanjuan C Carrisa Keller, DO [facility]  If 7PM-7AM, please contact night-coverage [email or web address]  05/26/2022, 8:19 AM

## 2022-05-26 NOTE — Progress Notes (Signed)
EEG complete - results pending 

## 2022-05-26 NOTE — Assessment & Plan Note (Signed)
Stable.  NYHA II -Holding diuretics overnight to allow for gentle fluid hydration overnight.  Will resume torsemide and spironolactone in the morning

## 2022-05-26 NOTE — Assessment & Plan Note (Signed)
History of seizure.  Low suspicion of breakthrough seizures with current symptoms.  Continue Keppra and Vimpat.

## 2022-05-27 DIAGNOSIS — R5383 Other fatigue: Secondary | ICD-10-CM | POA: Diagnosis not present

## 2022-05-27 DIAGNOSIS — R9082 White matter disease, unspecified: Secondary | ICD-10-CM | POA: Diagnosis not present

## 2022-05-27 DIAGNOSIS — Z7401 Bed confinement status: Secondary | ICD-10-CM | POA: Diagnosis not present

## 2022-05-27 DIAGNOSIS — R29818 Other symptoms and signs involving the nervous system: Secondary | ICD-10-CM | POA: Diagnosis not present

## 2022-05-27 DIAGNOSIS — R531 Weakness: Secondary | ICD-10-CM | POA: Diagnosis not present

## 2022-05-27 MED ORDER — MELATONIN 3 MG PO TABS
3.0000 mg | ORAL_TABLET | Freq: Once | ORAL | Status: AC | PRN
  Administered 2022-05-27: 3 mg via ORAL
  Filled 2022-05-27: qty 1

## 2022-05-27 MED ORDER — TEMAZEPAM 7.5 MG PO CAPS: 7.5000 mg | ORAL_CAPSULE | Freq: Every evening | ORAL | 0 refills | Status: DC | PRN

## 2022-05-27 MED ORDER — DILTIAZEM HCL ER COATED BEADS 120 MG PO CP24: 120.0000 mg | ORAL_CAPSULE | Freq: Every day | ORAL | 0 refills | Status: DC

## 2022-05-27 MED ORDER — SPIRONOLACTONE 25 MG PO TABS: 12.5000 mg | ORAL_TABLET | Freq: Every day | ORAL | 2 refills | Status: DC

## 2022-05-27 NOTE — Discharge Summary (Signed)
Physician Discharge Summary  Lawrence [name] SEG:[id number] DOB: Oct 22, 1955 DOA: 05/25/2022  PCP: [name], PA-C  Admit date: 05/25/2022 Discharge date: 05/27/2022  Admitted From: Home Disposition: Home  Recommendations for Outpatient Follow-up:  Follow up with PCP in 1-2 weeks Follow-up with neurology as scheduled  Home Health: PT Equipment/Devices: No new equipment  Discharge Condition: Stable CODE STATUS: DNR Diet recommendation: Low-salt low-fat low-carb diet  Brief/Interim Summary: Lawrence [name] is a 66 y.o. male with medical history significant of prior CVA with residual left-sided hemiparesis, morbid obesity, chronic diastolic heart failure, OSA on CPAP, type 2 diabetes, hypertension, hyperlipidemia, paroxysmal atrial fibrillation on Eliquis, suspected seizure on Keppra/Vimpat, COPD who presents with right-sided weakness, dysarthria.   Recently hospitalized from 02/24/2022 to 02/26/2022 with shaking of arm, left facial droop.  CVA work-up was negative with CT head, CTA head and neck, MRI brain.  Neurology was consulted at that time and thought likely due to a breakthrough seizure.  Vimpat was added in addition to his home Keppra.  For CVA prophylaxis, patient advised to continue Eliquis and also had aspirin 81 mg daily added.   Patient admitted as above with recurrent transient episode of reported right leg weakness.  Symptoms appear to have resolved quite quickly, delays in MRI due to spinal cord stimulator have prolonged his hospital stay, at this point his MRI was performed overnight and was found to be unremarkable for any acute processes.  Given patient's drastic improvement in symptoms he is otherwise stable and agreeable for discharge home, will resume patient's home health PT per discussion with patient and family at bedside otherwise no medication changes from home regimen as below.  Lengthy discussion with patient and family at bedside, due to computer error his  home med rec appears to have been imported improperly and were unable to be corrected prior to disposition, we recommend to continue his home medications as previously taking.  Otherwise medically stable for discharge currently awaiting transport due to his inability to get in and out of his friend's low car -  he is requesting transport, it is not medically necessary.  Discharge Diagnoses:  Principal Problem:   Right sided weakness Active Problems:   OSA (obstructive sleep apnea)   Morbid obesity (HCC)   Diabetes mellitus type 2 in obese (HCC)   Chronic diastolic CHF (congestive heart failure) (HCC)   AF (paroxysmal atrial fibrillation) ([place])   Pure hypercholesterolemia   Essential hypertension   Seizure [facility]   Dysarthria    Discharge Instructions  Discharge Instructions     Discharge patient   Complete by: As directed    Discharge disposition: 06-Home-Health Care Svc   Discharge patient date: 05/27/2022      Allergies as of 05/27/2022       Reactions   Vancomycin Other (See Comments)   "Red Man Syndrome" 02/02/17: possible cause for rash under both arms   Niacin Other (See Comments)   Niacin And Related Other (See Comments)   Red man syndrome   Tubersol [tuberculin, Ppd] Other (See Comments)   Reaction unknown   Doxycycline Rash, Other (See Comments)        Medication List     STOP taking these medications    aspirin EC 81 MG tablet   metoprolol succinate 50 MG 24 hr tablet Commonly known as: TOPROL-XL   traZODone 100 MG tablet Commonly known as: DESYREL   Trulicity 4.5 VP/7.1GG Sopn Generic drug: Dulaglutide   Valtoco 15 MG Dose 2 x 7.5 MG/0.1ML  Lqpk Generic drug: diazePAM (15 MG Dose)       TAKE these medications    acetaminophen 325 MG tablet Commonly known as: TYLENOL Take 2 tablets (650 mg total) by mouth every 4 (four) hours as needed for mild pain (or temp > 37.5 C (99.5 F)).   albuterol 108 (90 Base) MCG/ACT inhaler Commonly known  as: VENTOLIN HFA Inhale 2 puffs into the lungs every 6 (six) hours as needed for wheezing or shortness of breath.   allopurinol 100 MG tablet Commonly known as: ZYLOPRIM Take 200 mg by mouth daily.   ARIPiprazole 5 MG tablet Commonly known as: ABILIFY TAKE ONE TABLET BY MOUTH EVERYDAY AT BEDTIME What changed:  how much to take how to take this when to take this additional instructions   atorvastatin 40 MG tablet Commonly known as: LIPITOR Take 1 tablet (40 mg total) by mouth every evening. TAKE ONE TABLET BY MOUTH DAILY AT 6PM What changed: additional instructions   busPIRone 10 MG tablet Commonly known as: BUSPAR Take 1 tablet (10 mg total) by mouth 3 (three) times daily.   cetirizine 10 MG tablet Commonly known as: ZYRTEC Take 10 mg by mouth at bedtime.   Dexcom G7 Receiver Devi To display CGM data   Dexcom G7 Sensor Misc 1 Device by Does not apply route as directed. Change sensor every 10 days   diltiazem 120 MG 24 hr capsule Commonly known as: CARDIZEM CD Take 1 capsule (120 mg total) by mouth daily. What changed:  medication strength how much to take   Eliquis 5 MG Tabs tablet Generic drug: apixaban TAKE ONE TABLET BY MOUTH TWICE DAILY What changed: how much to take   Farxiga 10 MG Tabs tablet Generic drug: dapagliflozin propanediol TAKE ONE TABLET BY MOUTH BEFORE BREAKFAST What changed: See the new instructions.   ferrous sulfate 324 (65 Fe) MG Tbec Take 324 mg by mouth daily with breakfast.   fluticasone 50 MCG/ACT nasal spray Commonly known as: FLONASE Place 2 sprays into both nostrils daily.   isosorbide mononitrate 30 MG 24 hr tablet Commonly known as: IMDUR TAKE ONE TABLET BY MOUTH EVERY EVENING   Lacosamide 150 MG Tabs Take 1 tablet (150 mg total) by mouth 2 (two) times daily.   levETIRAcetam 500 MG tablet Commonly known as: KEPPRA Take 1 tablet (500 mg total) by mouth 2 (two) times daily.   metFORMIN 500 MG 24 hr tablet Commonly  known as: GLUCOPHAGE-XR Take 4 tablets (2,000 mg total) by mouth daily. What changed:  how much to take when to take this   Mounjaro 5 MG/0.5ML Pen Generic drug: tirzepatide INJECT '5mg'$  FOR four WEEKS What changed:  how much to take how to take this when to take this additional instructions   omeprazole 20 MG capsule Commonly known as: PRILOSEC Take 20 mg by mouth every morning.   potassium chloride SA 20 MEQ tablet Commonly known as: KLOR-CON M Take 2 tablets (40 mEq total) by mouth 2 (two) times daily.   prazosin 2 MG capsule Commonly known as: MINIPRESS Take two tablets at bedtime. What changed:  how much to take how to take this when to take this additional instructions   pregabalin 150 MG capsule Commonly known as: LYRICA TAKE ONE CAPSULE BY MOUTH THREE TIMES DAILY   repaglinide 2 MG tablet Commonly known as: PRANDIN Take 2 tablets (4 mg total) by mouth 2 (two) times daily before a meal.   spironolactone 25 MG tablet Commonly known as: ALDACTONE Take  0.5 tablets (12.5 mg total) by mouth daily. What changed: how much to take   temazepam 7.5 MG capsule Commonly known as: RESTORIL Take 1 capsule (7.5 mg total) by mouth at bedtime as needed for sleep. What changed:  medication strength See the new instructions.   torsemide 20 MG tablet Commonly known as: DEMADEX TAKE 4 TABLETS BY MOUTH EVERY MORNING and TAKE THREE TABLETS BY MOUTH EVERY EVENING What changed: See the new instructions.   Trelegy Ellipta 200-62.5-25 MCG/ACT Aepb Generic drug: Fluticasone-Umeclidin-Vilant Inhale 1 puff into the lungs daily. What changed: how much to take   Tresiba FlexTouch 100 UNIT/ML FlexTouch Pen Generic drug: insulin degludec Inject 40 Units into the skin daily.   Vascepa 1 g capsule Generic drug: icosapent Ethyl TAKE TWO CAPSULES BY MOUTH TWICE DAILY   venlafaxine XR 75 MG 24 hr capsule Commonly known as: EFFEXOR-XR Take 3 capsules (225 mg total) by mouth daily  with breakfast.   VITAMIN D3 PO Take 2,000 Units by mouth daily.        Follow-up Information     [facility] [place] Follow up.   Specialty: Home Health Services Why: The home health agency will contact you for the first home visit. Contact information: [address] [place] [address] [phone]         Alric Ran, MD. Go on 07/07/2022.   Specialty: Neurology Contact information: [address] [place] [address] [phone]                Allergies  Allergen Reactions   Vancomycin Other (See Comments)    "Red Man Syndrome" 02/02/17: possible cause for rash under both arms   Niacin Other (See Comments)   Niacin And Related Other (See Comments)    Red man syndrome   Tubersol [Tuberculin, Ppd] Other (See Comments)    Reaction unknown   Doxycycline Rash and Other (See Comments)    Consultations: Neurology   Procedures/Studies: MR BRAIN WO CONTRAST  Result Date: 05/27/2022 CLINICAL DATA:  Initial evaluation for neuro deficit, stroke suspected. EXAM: MRI HEAD WITHOUT CONTRAST TECHNIQUE: Multiplanar, multiecho pulse sequences of the brain and surrounding structures were obtained without intravenous contrast. COMPARISON:  Prior CTA from 05/25/2022 and MRI from 01/26/2022. FINDINGS: Brain: Cerebral volume within normal limits. Less than expected cerebral white matter disease for age. No abnormal foci of restricted diffusion to suggest acute or subacute ischemia. Gray-white matter differentiation maintained. No areas of chronic cortical infarction. No visible acute or chronic intracranial blood products. No mass lesion, midline shift or mass effect no hydrocephalus or extra-axial fluid collection. Pituitary gland and suprasellar region within normal limits. Vascular: Major intracranial vascular flow voids are maintained. Skull and upper cervical spine: Craniocervical junction within normal limits. Bone marrow signal intensity normal. No  scalp soft tissue abnormality. Sinuses/Orbits: Globes orbital soft tissues demonstrate no acute finding. Scattered mucosal thickening present throughout the sphenoethmoidal and maxillary sinuses. No significant mastoid effusion. Other: None. IMPRESSION: Normal brain MRI for age. No acute intracranial infarct or other abnormality. Electronically Signed   By: Jeannine Boga M.D.   On: 05/27/2022 01:58   ECHOCARDIOGRAM COMPLETE  Result Date: 05/26/2022    ECHOCARDIOGRAM REPORT   Patient Name:   SHAMEEK NYQUIST Date of Exam: 05/26/2022 Medical Rec #:  [id number]        Height:       73.0 in Accession #:    [id number]       Weight:  369.0 lb Date of Birth:  1955/10/21        BSA:          2.791 m Patient Age:    44 years         BP:           103/51 mmHg Patient Gender: M                HR:           80 bpm. Exam Location:  Inpatient Procedure: 2D Echo, Color Doppler and Cardiac Doppler Indications:    Stroke  History:        Patient has prior history of Echocardiogram examinations, most                 recent 08/10/2021. COPD, Arrythmias:Tachycardia,                 Signs/Symptoms:Shortness of Breath; Risk Factors:Diabetes and                 Hypertension.  Sonographer:    Memory Argue Referring Phys: [id number] [place] T TU IMPRESSIONS  1. Left ventricular ejection fraction, by estimation, is 60 to 65%. The left ventricle has normal function. The left ventricle has no regional wall motion abnormalities. The left ventricular internal cavity size was mildly dilated. Left ventricular diastolic parameters are consistent with Grade I diastolic dysfunction (impaired relaxation).  2. Right ventricular systolic function is normal. The right ventricular size is normal.  3. Left atrial size was severely dilated.  4. The mitral valve is normal in structure. No evidence of mitral valve regurgitation. No evidence of mitral stenosis.  5. The aortic valve is calcified. Aortic valve regurgitation is not visualized. Moderate  aortic valve stenosis.  6. The inferior vena cava is dilated in size with >50% respiratory variability, suggesting right atrial pressure of 8 mmHg. FINDINGS  Left Ventricle: Left ventricular ejection fraction, by estimation, is 60 to 65%. The left ventricle has normal function. The left ventricle has no regional wall motion abnormalities. The left ventricular internal cavity size was mildly dilated. There is  no left ventricular hypertrophy. Left ventricular diastolic parameters are consistent with Grade I diastolic dysfunction (impaired relaxation). Right Ventricle: The right ventricular size is normal. Right ventricular systolic function is normal. Left Atrium: Left atrial size was severely dilated. Right Atrium: Right atrial size was normal in size. Pericardium: There is no evidence of pericardial effusion. Mitral Valve: The mitral valve is normal in structure. Mild mitral annular calcification. No evidence of mitral valve regurgitation. No evidence of mitral valve stenosis. Tricuspid Valve: The tricuspid valve is normal in structure. Tricuspid valve regurgitation is not demonstrated. No evidence of tricuspid stenosis. Aortic Valve: The aortic valve is calcified. Aortic valve regurgitation is not visualized. Moderate aortic stenosis is present. Aortic valve mean gradient measures 28.0 mmHg. Aortic valve peak gradient measures 45.2 mmHg. Aortic valve area, by VTI measures 0.99 cm. Pulmonic Valve: The pulmonic valve was not well visualized. Pulmonic valve regurgitation is not visualized. No evidence of pulmonic stenosis. Aorta: The aortic root is normal in size and structure. Venous: The inferior vena cava is dilated in size with greater than 50% respiratory variability, suggesting right atrial pressure of 8 mmHg. IAS/Shunts: No atrial level shunt detected by color flow Doppler.  LEFT VENTRICLE PLAX 2D LVIDd:         5.70 cm   Diastology LVIDs:         3.70 cm   LV e'  medial:    6.42 cm/s LV PW:         1.10 cm    LV E/e' medial:  13.3 LV IVS:        1.00 cm   LV e' lateral:   7.29 cm/s LVOT diam:     2.00 cm   LV E/e' lateral: 11.8 LV SV:         79 LV SV Index:   28 LVOT Area:     3.14 cm  RIGHT VENTRICLE TAPSE (M-mode): 2.2 cm LEFT ATRIUM              Index        RIGHT ATRIUM           Index LA diam:        3.10 cm  1.11 cm/m   RA Area:     20.50 cm LA Vol (A2C):   145.0 ml 51.95 ml/m  RA Volume:   56.40 ml  20.21 ml/m LA Vol (A4C):   181.0 ml 64.84 ml/m LA Biplane Vol: 162.0 ml 58.04 ml/m  AORTIC VALVE AV Area (Vmax):    0.90 cm AV Area (Vmean):   0.84 cm AV Area (VTI):     0.99 cm AV Vmax:           336.00 cm/s AV Vmean:          248.000 cm/s AV VTI:            0.800 m AV Peak Grad:      45.2 mmHg AV Mean Grad:      28.0 mmHg LVOT Vmax:         96.50 cm/s LVOT Vmean:        66.600 cm/s LVOT VTI:          0.253 m LVOT/AV VTI ratio: 0.32  AORTA Ao Root diam: 3.50 cm Ao Asc diam:  3.50 cm MITRAL VALVE MV Area (PHT): 3.24 cm    SHUNTS MV Decel Time: 234 msec    Systemic VTI:  0.25 m MV E velocity: 85.70 cm/s  Systemic Diam: 2.00 cm MV A velocity: 82.70 cm/s MV E/A ratio:  1.04 Kirk Ruths MD Electronically signed by Kirk Ruths MD Signature Date/Time: 05/26/2022/12:44:40 PM    Final    EEG adult  Result Date: 05/26/2022 Lora Havens, MD     05/26/2022 10:56 AM Patient Name: ARIES TOWNLEY MRN: [id number] Epilepsy Attending: Lora Havens Referring Physician/Provider: Rosalin Hawking, MD Date: 05/26/2022 Duration: 22.15 mins Patient history: 66 year old male with a history of paroxysmal atrial fibrillation on Eliquis, seizures, 3 prior strokes and chronic left sided weakness secondary to prior stroke, who presents with acute onset of right sided weakness, right sided sensory loss and dysarthria. EEG to evaluate for seizure. Level of alertness: Awake, asleep AEDs during EEG study: LEV, LCM Technical aspects: This EEG study was done with scalp electrodes positioned according to the 10-20  International system of electrode placement. Electrical activity was reviewed with band pass filter of 1-'70Hz'$ , sensitivity of 7 uV/mm, display speed of 67m/sec with a '60Hz'$  notched filter applied as appropriate. EEG data were recorded continuously and digitally stored.  Video monitoring was available and reviewed as appropriate. Description: The posterior dominant rhythm consists of 8 Hz activity of moderate voltage (25-35 uV) seen predominantly in posterior head regions, symmetric and reactive to eye opening and eye closing. Sleep was characterized by vertex waves, sleep spindles (12 to 14 Hz), maximal frontocentral region.  Hyperventilation and photic stimulation were not performed.   IMPRESSION: This study is within normal limits. No seizures or epileptiform discharges were seen throughout the recording. A normal interictal EEG does not exclude the diagnosis of epilepsy. Priyanka Barbra Sarks   CT ANGIO HEAD NECK W WO CM (CODE STROKE)  Result Date: 05/25/2022 CLINICAL DATA:  Initial evaluation for neuro deficit, stroke suspected. Right-sided weakness. EXAM: CT ANGIOGRAPHY HEAD AND NECK TECHNIQUE: Multidetector CT imaging of the head and neck was performed using the standard protocol during bolus administration of intravenous contrast. Multiplanar CT image reconstructions and MIPs were obtained to evaluate the vascular anatomy. Carotid stenosis measurements (when applicable) are obtained utilizing NASCET criteria, using the distal internal carotid diameter as the denominator. RADIATION DOSE REDUCTION: This exam was performed according to the departmental dose-optimization program which includes automated exposure control, adjustment of the mA and/or kV according to patient size and/or use of iterative reconstruction technique. CONTRAST:  6m OMNIPAQUE IOHEXOL 350 MG/ML SOLN COMPARISON:  Comparison made with prior CT from earlier the same day as well as recent CTA from 02/25/2022. FINDINGS: CTA NECK FINDINGS  Aortic arch: Visualized aortic arch normal in caliber with normal branch pattern. Mild atheromatous change about the arch itself. No hemodynamically significant stenosis about the origin the great vessels. Right carotid system: Right common and internal carotid arteries are tortuous and partially medialized into the retropharyngeal space. Mild atheromatous change about the right carotid bulb without hemodynamically significant greater than 50% stenosis. No dissection or occlusion. Left carotid system: Left common and internal carotid arteries are tortuous and partially medialized into the retropharyngeal space. Atheromatous change about the left carotid bulb without hemodynamically significant greater than 50% stenosis. No dissection or occlusion. Vertebral arteries: Both vertebral arteries arise from the subclavian arteries. No proximal subclavian artery stenosis. Both vertebral arteries widely patent without stenosis, dissection or occlusion. Skeleton: No discrete or worrisome osseous lesions. Moderate spondylosis present at C5-6 through C7-T1. Other neck: No other acute soft tissue abnormality within the neck. Upper chest: Dependent atelectatic changes noted within the visualized lungs. Visualized upper chest demonstrates no other acute finding. Review of the MIP images confirms the above findings CTA HEAD FINDINGS Anterior circulation: Petrous segments widely patent. Mild atheromatous change within the carotid siphons without significant stenosis. A1 segments, anterior communicating complex common anterior cerebral arteries widely patent. No M1 stenosis or occlusion. No proximal MCA branch occlusion or high-grade stenosis. Distal MCA branches perfused and symmetric. Posterior circulation: Both V4 segments widely patent to the vertebrobasilar junction without stenosis. Both PICA patent. Basilar patent to its distal aspect without stenosis. Superior cerebellar and posterior cerebral arteries widely patent  bilaterally. Venous sinuses: Patent allowing for timing the contrast bolus. Anatomic variants: None significant.  No aneurysm. Review of the MIP images confirms the above findings IMPRESSION: 1. Negative CTA for large vessel occlusion or other emergent finding. 2. Mild atheromatous change about the aortic arch, carotid bifurcations and carotid siphons without hemodynamically significant stenosis. Case discussed by telephone at the time of interpretation on 05/25/2022 at 9:48 p.m. to provider ERIC LINDZEN. Electronically Signed   By: BJeannine BogaM.D.   On: 05/25/2022 22:07   CT HEAD CODE STROKE WO CONTRAST  Result Date: 05/25/2022 CLINICAL DATA:  Code stroke.  Neuro deficit, acute, stroke suspected EXAM: CT HEAD WITHOUT CONTRAST TECHNIQUE: Contiguous axial images were obtained from the base of the skull through the vertex without intravenous contrast. RADIATION DOSE REDUCTION: This exam was performed according to the departmental dose-optimization program which  includes automated exposure control, adjustment of the mA and/or kV according to patient size and/or use of iterative reconstruction technique. COMPARISON:  CT head 02/24/2022. FINDINGS: Brain: No evidence of acute large vascular territory infarction, hemorrhage, hydrocephalus, extra-axial collection or mass lesion/mass effect. Vascular: No hyperdense vessel identified. Skull: No acute fracture. Sinuses/Orbits: Clear sinuses.  No acute orbital findings. Other: No mastoid effusions. ASPECTS [facility] Stroke Program Early CT Score) total score (0-10 with 10 being normal): 10. IMPRESSION: No evidence of acute intracranial abnormality. ASPECTS is 10. Code stroke imaging results were communicated on 05/25/2022 at 9:23 pm to provider Dr. Cheral Marker via secure text paging. Electronically Signed   By: Margaretha Sheffield M.D.   On: 05/25/2022 21:24     Subjective: No acute issues or events overnight   Discharge Exam: Vitals:   05/27/22 1124 05/27/22  1124  BP: (!) 147/87 (!) 147/87  Pulse: 91 91  Resp: 20 18  Temp: 98.7 F (37.1 C) 98.3 F (36.8 C)  SpO2: 91% 91%   Vitals:   05/27/22 0333 05/27/22 0856 05/27/22 1124 05/27/22 1124  BP: 127/74 123/62 (!) 147/87 (!) 147/87  Pulse: 90 81 91 91  Resp: '13 18 20 18  '$ Temp: 97.7 F (36.5 C) 98.6 F (37 C) 98.7 F (37.1 C) 98.3 F (36.8 C)  TempSrc: Axillary  Oral   SpO2: 90% 90% 91% 91%  Weight:      Height:        General: Pt is alert, awake, not in acute distress Cardiovascular: RRR, S1/S2 +, no rubs, no gallops Respiratory: CTA bilaterally, no wheezing, no rhonchi Abdominal: Soft, NT, ND, bowel sounds + Extremities: no edema, no cyanosis    The results of significant diagnostics from this hospitalization (including imaging, microbiology, ancillary and laboratory) are listed below for reference.     Microbiology: No results found for this or any previous visit (from the past 240 hour(s)).   Labs: BNP (last 3 results) Recent Labs    08/12/21 0125 08/19/21 1300 03/09/22 2123  BNP 18.1 37.0 29.9   Basic Metabolic Panel: Recent Labs  Lab 05/25/22 2101 05/25/22 2110  NA 140 140  K 4.0 4.0  CL 100 99  CO2 29  --   GLUCOSE 175* 173*  BUN 25* 30*  CREATININE 1.17 1.20  CALCIUM 9.1  --    Liver Function Tests: Recent Labs  Lab 05/25/22 2101  AST 14*  ALT 18  ALKPHOS 78  BILITOT 0.3  PROT 6.9  ALBUMIN 3.7   No results for input(s): "LIPASE", "AMYLASE" in the last 168 hours. No results for input(s): "AMMONIA" in the last 168 hours. CBC: Recent Labs  Lab 05/25/22 2101 05/25/22 2110  WBC 11.0*  --   NEUTROABS 8.1*  --   HGB 15.1 15.6  HCT 46.7 46.0  MCV 90.9  --   PLT 243  --    Cardiac Enzymes: No results for input(s): "CKTOTAL", "CKMB", "CKMBINDEX", "TROPONINI" in the last 168 hours. BNP: Invalid input(s): "POCBNP" CBG: Recent Labs  Lab 05/25/22 2102  GLUCAP 187*   D-Dimer No results for input(s): "DDIMER" in the last 72 hours. Hgb  A1c Recent Labs    05/26/22 0812  HGBA1C 7.2*   Lipid Profile Recent Labs    05/26/22 0205  CHOL 150  HDL 30*  LDLCALC 80  TRIG 200*  CHOLHDL 5.0   Thyroid function studies Recent Labs    05/26/22 0812  TSH 1.182   Anemia work up [employer]  05/26/22 0812  VITAMINB12 223   Urinalysis    Component Value Date/Time   COLORURINE STRAW (A) 03/09/2022 2145   APPEARANCEUR CLEAR 03/09/2022 2145   LABSPEC 1.009 03/09/2022 2145   PHURINE 5.0 03/09/2022 2145   GLUCOSEU >=500 (A) 03/09/2022 2145   HGBUR NEGATIVE 03/09/2022 2145   BILIRUBINUR NEGATIVE 03/09/2022 2145   KETONESUR NEGATIVE 03/09/2022 2145   PROTEINUR NEGATIVE 03/09/2022 2145   NITRITE NEGATIVE 03/09/2022 2145   LEUKOCYTESUR NEGATIVE 03/09/2022 2145   Sepsis Labs Recent Labs  Lab 05/25/22 2101  WBC 11.0*   Microbiology No results found for this or any previous visit (from the past 240 hour(s)).   Time coordinating discharge: Over 30 minutes  SIGNED:   Little Ishikawa, DO [facility] 05/27/2022, 5:00 PM Pager   If 7PM-7AM, please contact night-coverage [email or web address]

## 2022-05-27 NOTE — Evaluation (Addendum)
Physical Therapy Evaluation Patient Details Name: Lawrence [name] MRN: [id number] DOB: 1956/05/21 Today's Date: 05/27/2022  History of Present Illness  66 y.o. male adm 10/18 with medical history significant of prior CVA with residual left-sided hemiparesis, morbid obesity, chronic diastolic heart failure, OSA on CPAP, type 2 diabetes, hypertension, hyperlipidemia, paroxysmal atrial fibrillation on Eliquis, suspected seizure on Keppra/Vimpat, COPD who presented with right-sided weakness, dysarthria.  CT: neg and MRI: neg   Clinical Impression  PT eval complete. Pt required min assist bed mobility, min assist sit to stand, and min guard assist ambulation 20' with bariatric rollator. Slow, guarded gait but no LOB. Fatigued quickly. Pt has scooter to use for longer distances and ramp to enter home. Plan is for d/c home today. Wife and PCA able to provide needed level of assist. Recommend HHPT.  PT signing off.     Recommendations for follow up therapy are one component of a multi-disciplinary discharge planning process, led by the attending physician.  Recommendations may be updated based on patient status, additional functional criteria and insurance authorization.  Follow Up Recommendations Home health PT      Assistance Recommended at Discharge PRN  Patient can return home with the following  A little help with walking and/or transfers;A little help with bathing/dressing/bathroom;Assistance with cooking/housework;Assist for transportation;Help with stairs or ramp for entrance    Equipment Recommendations None recommended by PT  Recommendations for Other Services       Functional Status Assessment Patient has had a recent decline in their functional status and demonstrates the ability to make significant improvements in function in a reasonable and predictable amount of time.     Precautions / Restrictions Precautions Precautions: Fall      Mobility  Bed Mobility Overal bed  mobility: Needs Assistance Bed Mobility: Supine to Sit     Supine to sit: Min assist, HOB elevated     General bed mobility comments: +rail, increased time, assist to elevate trunk    Transfers Overall transfer level: Needs assistance Equipment used: Rollator (4 wheels) Transfers: Sit to/from Stand Sit to Stand: Min assist           General transfer comment: assist to stabilize rollator and power up. Pt using momentum.    Ambulation/Gait Ambulation/Gait assistance: Min guard Gait Distance (Feet): 20 Feet Assistive device: Rollator (4 wheels) Gait Pattern/deviations: Wide base of support, Step-through pattern, Decreased stride length Gait velocity: decreased Gait velocity interpretation: <1.8 ft/sec, indicate of risk for recurrent falls   General Gait Details: slow, guarded gait. Fatigued quickly.  Stairs            Wheelchair Mobility    Modified Rankin (Stroke Patients Only)       Balance Overall balance assessment: Needs assistance Sitting-balance support: Feet supported, No upper extremity supported Sitting balance-Leahy Scale: Fair     Standing balance support: Bilateral upper extremity supported, During functional activity, Reliant on assistive device for balance Standing balance-Leahy Scale: Poor                               Pertinent Vitals/Pain Pain Assessment Pain Assessment: No/denies pain    Home Living Family/patient expects to be discharged to:: Private residence Living Arrangements: Spouse/significant other Available Help at Discharge: Family;Available 24 hours/day Type of Home: House Home Access: Level entry       Home Layout: One level Home Equipment: Transport planner (4 wheels);Grab bars - tub/shower;Hospital bed Additional Comments: Has  a PCA 4 days a week a couple hours a day    Prior Function Prior Level of Function : Needs assist             Mobility Comments: Rollator for household  mobility (short distances); electric scooter in community dwellings. ADLs Comments: Reports some assist for ADLs from aide (bathing more than dressing). Able to complete toileting with min A.  BSC in bedroom, cannot access bathroom.     Hand Dominance   Dominant Hand: Right    Extremity/Trunk Assessment   Upper Extremity Assessment Upper Extremity Assessment: Defer to OT evaluation    Lower Extremity Assessment Lower Extremity Assessment: Generalized weakness;RLE deficits/detail;LLE deficits/detail RLE Deficits / Details: Pt reports RLE strength is at baseline. Acute onset of R weakness resolved. LLE Deficits / Details: h/o CVA with L residual weakness    Cervical / Trunk Assessment Cervical / Trunk Assessment: Other exceptions Cervical / Trunk Exceptions: body habitus  Communication   Communication: No difficulties  Cognition Arousal/Alertness: Awake/alert Behavior During Therapy: WFL for tasks assessed/performed Overall Cognitive Status: Within Functional Limits for tasks assessed                                          General Comments General comments (skin integrity, edema, etc.): VSS on RA    Exercises     Assessment/Plan    PT Assessment All further PT needs can be met in the next venue of care  PT Problem List Decreased strength;Decreased mobility;Decreased activity tolerance;Decreased balance       PT Treatment Interventions      PT Goals (Current goals can be found in the Care Plan section)  Acute Rehab PT Goals Patient Stated Goal: home PT Goal Formulation: All assessment and education complete, DC therapy    Frequency       Co-evaluation               AM-PAC PT "6 Clicks" Mobility  Outcome Measure Help needed turning from your back to your side while in a flat bed without using bedrails?: A Little Help needed moving from lying on your back to sitting on the side of a flat bed without using bedrails?: A Lot Help needed  moving to and from a bed to a chair (including a wheelchair)?: A Little Help needed standing up from a chair using your arms (e.g., wheelchair or bedside chair)?: A Little Help needed to walk in hospital room?: A Little Help needed climbing 3-5 steps with a railing? : Total 6 Click Score: 15    End of Session   Activity Tolerance: Patient tolerated treatment well Patient left: in bed;with call bell/phone within reach;with family/visitor present Nurse Communication: Mobility status PT Visit Diagnosis: Muscle weakness (generalized) (M62.81);Difficulty in walking, not elsewhere classified (R26.2)    Time: 0941-1000 PT Time Calculation (min) (ACUTE ONLY): 19 min   Charges:   PT Evaluation $PT Eval Moderate Complexity: 1 Mod          Lorrin Goodell, PT  Office # [phone] Pager [phone]   Lorriane Shire 05/27/2022, 11:18 AM

## 2022-05-27 NOTE — Progress Notes (Signed)
Occupational Therapy Treatment Patient Details Name: Lawrence [name] MRN: [id number] DOB: 1955-10-10 Today's Date: 05/27/2022   History of present illness 66 y.o. male adm 10/18 with medical history significant of prior CVA with residual left-sided hemiparesis, morbid obesity, chronic diastolic heart failure, OSA on CPAP, type 2 diabetes, hypertension, hyperlipidemia, paroxysmal atrial fibrillation on Eliquis, suspected seizure on Keppra/Vimpat, COPD who presented with right-sided weakness, dysarthria.  CT: neg and MRI: neg   OT comments  Pt progressing towards established OT goals. Continues to require min A for sit<>stand, but reports he has lift chair at home. Reviewing fall prevention with pt and pt demonstrating good comprehension of fall prevention strategies. Establishing HEP with pt of chair push ups, sit<>stands, lateral pinch, 3 point pinch, and gross grasp. Offering theraputty, but pt reporting he has some at home. Pt verbalizing how he typically performs LB dressing with AE. Recommending HHOT to optimize safety and independence in ADL and IADL.    Recommendations for follow up therapy are one component of a multi-disciplinary discharge planning process, led by the attending physician.  Recommendations may be updated based on patient status, additional functional criteria and insurance authorization.    Follow Up Recommendations  Home health OT    Assistance Recommended at Discharge Frequent or constant Supervision/Assistance  Patient can return home with the following  A little help with walking and/or transfers;A little help with bathing/dressing/bathroom;Assistance with cooking/housework;Assist for transportation   Equipment Recommendations  None recommended by OT (Pt has recommended DME)    Recommendations for Other Services      Precautions / Restrictions Precautions Precautions: Fall Restrictions Weight Bearing Restrictions: No       Mobility Bed Mobility                General bed mobility comments: EOB on arrival    Transfers Overall transfer level: Needs assistance Equipment used: Rollator (4 wheels) Transfers: Sit to/from Stand Sit to Stand: Min assist           General transfer comment: assist to stabilize rollator and power up. Pt using momentum.     Balance Overall balance assessment: Needs assistance Sitting-balance support: Feet supported, No upper extremity supported Sitting balance-Leahy Scale: Fair     Standing balance support: Bilateral upper extremity supported, During functional activity, Reliant on assistive device for balance Standing balance-Leahy Scale: Poor                             ADL either performed or assessed with clinical judgement   ADL Overall ADL's : Needs assistance/impaired                     Lower Body Dressing: Set up;Sitting/lateral leans Lower Body Dressing Details (indicate cue type and reason): Verbalizing how to don socks using sock aid             Functional mobility during ADLs: Min guard;Rollator (4 wheels)      Extremity/Trunk Assessment Upper Extremity Assessment Upper Extremity Assessment: Generalized weakness;LUE deficits/detail LUE Deficits / Details: decreased grip strength from prior CVA   Lower Extremity Assessment Lower Extremity Assessment: Defer to PT evaluation        Vision       Perception     Praxis      Cognition Arousal/Alertness: Awake/alert Behavior During Therapy: WFL for tasks assessed/performed Overall Cognitive Status: Within Functional Limits for tasks assessed  General Comments: able to recall fall prevention strategies.        Exercises Exercises: Other exercises Other Exercises Other Exercises: chair push ups x10 Other Exercises: finger opposition x 5 Other Exercises: lateral pinch x10 Other Exercises: gross grasp x10    Shoulder Instructions        General Comments VSS    Pertinent Vitals/ Pain       Pain Assessment Pain Assessment: No/denies pain  Home Living                                          Prior Functioning/Environment              Frequency  Min 2X/week        Progress Toward Goals  OT Goals(current goals can now be found in the care plan section)  Progress towards OT goals: Progressing toward goals  Acute Rehab OT Goals Patient Stated Goal: go home OT Goal Formulation: With patient Time For Goal Achievement: 06/09/22 Potential to Achieve Goals: Fair ADL Goals Pt Will Perform Grooming: with set-up;sitting Pt Will Perform Upper Body Dressing: with set-up;sitting Pt Will Perform Lower Body Dressing: with min assist;sitting/lateral leans Pt Will Transfer to Toilet: with mod assist;stand pivot transfer;bedside commode Pt/caregiver will Perform Home Exercise Program: Increased strength;Both right and left upper extremity;With theraband;With minimal assist  Plan Discharge plan needs to be updated    Co-evaluation                 AM-PAC OT "6 Clicks" Daily Activity     Outcome Measure   Help from another person eating meals?: A Little Help from another person taking care of personal grooming?: A Little Help from another person toileting, which includes using toliet, bedpan, or urinal?: A Little Help from another person bathing (including washing, rinsing, drying)?: A Little Help from another person to put on and taking off regular upper body clothing?: A Little Help from another person to put on and taking off regular lower body clothing?: A Little 6 Click Score: 18    End of Session Equipment Utilized During Treatment: Rollator (4 wheels)  OT Visit Diagnosis: Unsteadiness on feet (R26.81);Muscle weakness (generalized) (M62.81);Hemiplegia and hemiparesis   Activity Tolerance Patient tolerated treatment well   Patient Left Other (comment) (On stretcher with EMS for  transport home)   Nurse Communication Mobility status        Time: [phone] OT Time Calculation (min): 18 min  Charges: OT General Charges $OT Visit: 1 Visit OT Treatments $Therapeutic Exercise: 8-22 mins  Lawrence [name], OTR/L [facility] Acute Rehabilitation Office: [phone]   Lawrence [name] 05/27/2022, 5:22 PM

## 2022-05-27 NOTE — Care Management Obs Status (Signed)
[place] NOTIFICATION   Patient Details  Name: Lawrence [name] MRN: [id number] Date of Birth: November 23, 1955   Medicare Observation Status Notification Given:  Yes    Pollie Friar, RN 05/27/2022, 10:24 AM

## 2022-05-27 NOTE — TOC Transition Note (Signed)
Transition of Care [facility]) - CM/SW Discharge Note   Patient Details  Name: ELSTER CORBELLO MRN: [id number] Date of Birth: 1956/04/24  Transition of Care [facility]) CM/SW Contact:  Pollie Friar, RN Phone Number: 05/27/2022, 10:25 AM   Clinical Narrative:    Pt is discharging home with home health services through [facility] home health. Pt has used them recently in the past and wants to use their services again. Information on the AVS.  Pt has all needed DME at home.  Pt has supervision at home with his spouse.  Pt uses [employer] or [employer] transportation for appointments.  Pt manages his own medications without any issues.  He has a caregiver M-F 8:30-12:30.  Pt requesting PTAR home. CM has verified his address and will arrange the transportation.   Final next level of care: Home w Home Health Services Barriers to Discharge: No Barriers Identified   Patient Goals and CMS Choice   CMS Medicare.gov Compare Post Acute Care list provided to:: Patient Choice offered to / list presented to : Patient  Discharge Placement                       Discharge Plan and Services                DME Arranged: 3-N-1         HH Arranged: PT, OT HH Agency: [place] Date [place]: 05/27/22   Representative spoke with at [place]: [place] ([place]) Interventions     Readmission Risk Interventions    08/12/2021   10:14 AM 08/12/2021   10:12 AM 02/11/2021    1:34 PM  Readmission Risk Prevention Plan  Transportation Screening Complete Complete Complete  Medication Review Press photographer) Complete Complete Complete  PCP or Specialist appointment within 3-5 days of discharge Complete    HRI or [place] Complete  Complete  SW Recovery Care/Counseling Consult Complete  Complete  Palliative Care Screening Not Applicable  Not [place] Not Applicable  Not Applicable

## 2022-05-27 NOTE — Progress Notes (Addendum)
STROKE TEAM PROGRESS NOTE   INTERVAL HISTORY Patient was stable this morning, voiced no concerns.  Vitals:   05/26/22 1815 05/26/22 1857 05/26/22 1927 05/27/22 0333  BP: (!) 100/57 109/62 105/68 127/74  Pulse: 86 86 89 90  Resp: '14 16 15 13  '$ Temp:  98.7 F (37.1 C) 98.2 F (36.8 C) 97.7 F (36.5 C)  TempSrc:  Oral Oral Axillary  SpO2: 91% 90% 91% 90%  Weight:      Height:       CBC:  Recent Labs  Lab 05/25/22 2101 05/25/22 2110  WBC 11.0*  --   NEUTROABS 8.1*  --   HGB 15.1 15.6  HCT 46.7 46.0  MCV 90.9  --   PLT 243  --    Basic Metabolic Panel:  Recent Labs  Lab 05/25/22 2101 05/25/22 2110  NA 140 140  K 4.0 4.0  CL 100 99  CO2 29  --   GLUCOSE 175* 173*  BUN 25* 30*  CREATININE 1.17 1.20  CALCIUM 9.1  --    Lipid Panel:  Recent Labs  Lab 05/26/22 0205  CHOL 150  TRIG 200*  HDL 30*  CHOLHDL 5.0  VLDL 40  LDLCALC 80   HgbA1c:  Recent Labs  Lab 05/26/22 0812  HGBA1C 7.2*   Urine Drug Screen: No results for input(s): "LABOPIA", "COCAINSCRNUR", "LABBENZ", "AMPHETMU", "THCU", "LABBARB" in the last 168 hours.  Alcohol Level  Recent Labs  Lab 05/25/22 2101  ETH <10    IMAGING past 24 hours MR BRAIN WO CONTRAST  Result Date: 05/27/2022 CLINICAL DATA:  Initial evaluation for neuro deficit, stroke suspected. EXAM: MRI HEAD WITHOUT CONTRAST TECHNIQUE: Multiplanar, multiecho pulse sequences of the brain and surrounding structures were obtained without intravenous contrast. COMPARISON:  Prior CTA from 05/25/2022 and MRI from 01/26/2022. FINDINGS: Brain: Cerebral volume within normal limits. Less than expected cerebral white matter disease for age. No abnormal foci of restricted diffusion to suggest acute or subacute ischemia. Gray-white matter differentiation maintained. No areas of chronic cortical infarction. No visible acute or chronic intracranial blood products. No mass lesion, midline shift or mass effect no hydrocephalus or extra-axial fluid  collection. Pituitary gland and suprasellar region within normal limits. Vascular: Major intracranial vascular flow voids are maintained. Skull and upper cervical spine: Craniocervical junction within normal limits. Bone marrow signal intensity normal. No scalp soft tissue abnormality. Sinuses/Orbits: Globes orbital soft tissues demonstrate no acute finding. Scattered mucosal thickening present throughout the sphenoethmoidal and maxillary sinuses. No significant mastoid effusion. Other: None. IMPRESSION: Normal brain MRI for age. No acute intracranial infarct or other abnormality. Electronically Signed   By: Jeannine Boga M.D.   On: 05/27/2022 01:58   ECHOCARDIOGRAM COMPLETE  Result Date: 05/26/2022    ECHOCARDIOGRAM REPORT   Patient Name:   NIGUEL MOURE Date of Exam: 05/26/2022 Medical Rec #:  [id number]        Height:       73.0 in Accession #:    [id number]       Weight:       369.0 lb Date of Birth:  May 25, 1956        BSA:          2.791 m Patient Age:    66 years         BP:           103/51 mmHg Patient Gender: M                HR:  80 bpm. Exam Location:  Inpatient Procedure: 2D Echo, Color Doppler and Cardiac Doppler Indications:    Stroke  History:        Patient has prior history of Echocardiogram examinations, most                 recent 08/10/2021. COPD, Arrythmias:Tachycardia,                 Signs/Symptoms:Shortness of Breath; Risk Factors:Diabetes and                 Hypertension.  Sonographer:    Memory Argue Referring Phys: [id number] [place] T TU IMPRESSIONS  1. Left ventricular ejection fraction, by estimation, is 60 to 65%. The left ventricle has normal function. The left ventricle has no regional wall motion abnormalities. The left ventricular internal cavity size was mildly dilated. Left ventricular diastolic parameters are consistent with Grade I diastolic dysfunction (impaired relaxation).  2. Right ventricular systolic function is normal. The right ventricular size is  normal.  3. Left atrial size was severely dilated.  4. The mitral valve is normal in structure. No evidence of mitral valve regurgitation. No evidence of mitral stenosis.  5. The aortic valve is calcified. Aortic valve regurgitation is not visualized. Moderate aortic valve stenosis.  6. The inferior vena cava is dilated in size with >50% respiratory variability, suggesting right atrial pressure of 8 mmHg. FINDINGS  Left Ventricle: Left ventricular ejection fraction, by estimation, is 60 to 65%. The left ventricle has normal function. The left ventricle has no regional wall motion abnormalities. The left ventricular internal cavity size was mildly dilated. There is  no left ventricular hypertrophy. Left ventricular diastolic parameters are consistent with Grade I diastolic dysfunction (impaired relaxation). Right Ventricle: The right ventricular size is normal. Right ventricular systolic function is normal. Left Atrium: Left atrial size was severely dilated. Right Atrium: Right atrial size was normal in size. Pericardium: There is no evidence of pericardial effusion. Mitral Valve: The mitral valve is normal in structure. Mild mitral annular calcification. No evidence of mitral valve regurgitation. No evidence of mitral valve stenosis. Tricuspid Valve: The tricuspid valve is normal in structure. Tricuspid valve regurgitation is not demonstrated. No evidence of tricuspid stenosis. Aortic Valve: The aortic valve is calcified. Aortic valve regurgitation is not visualized. Moderate aortic stenosis is present. Aortic valve mean gradient measures 28.0 mmHg. Aortic valve peak gradient measures 45.2 mmHg. Aortic valve area, by VTI measures 0.99 cm. Pulmonic Valve: The pulmonic valve was not well visualized. Pulmonic valve regurgitation is not visualized. No evidence of pulmonic stenosis. Aorta: The aortic root is normal in size and structure. Venous: The inferior vena cava is dilated in size with greater than 50% respiratory  variability, suggesting right atrial pressure of 8 mmHg. IAS/Shunts: No atrial level shunt detected by color flow Doppler.  LEFT VENTRICLE PLAX 2D LVIDd:         5.70 cm   Diastology LVIDs:         3.70 cm   LV e' medial:    6.42 cm/s LV PW:         1.10 cm   LV E/e' medial:  13.3 LV IVS:        1.00 cm   LV e' lateral:   7.29 cm/s LVOT diam:     2.00 cm   LV E/e' lateral: 11.8 LV SV:         79 LV SV Index:   28 LVOT Area:  3.14 cm  RIGHT VENTRICLE TAPSE (M-mode): 2.2 cm LEFT ATRIUM              Index        RIGHT ATRIUM           Index LA diam:        3.10 cm  1.11 cm/m   RA Area:     20.50 cm LA Vol (A2C):   145.0 ml 51.95 ml/m  RA Volume:   56.40 ml  20.21 ml/m LA Vol (A4C):   181.0 ml 64.84 ml/m LA Biplane Vol: 162.0 ml 58.04 ml/m  AORTIC VALVE AV Area (Vmax):    0.90 cm AV Area (Vmean):   0.84 cm AV Area (VTI):     0.99 cm AV Vmax:           336.00 cm/s AV Vmean:          248.000 cm/s AV VTI:            0.800 m AV Peak Grad:      45.2 mmHg AV Mean Grad:      28.0 mmHg LVOT Vmax:         96.50 cm/s LVOT Vmean:        66.600 cm/s LVOT VTI:          0.253 m LVOT/AV VTI ratio: 0.32  AORTA Ao Root diam: 3.50 cm Ao Asc diam:  3.50 cm MITRAL VALVE MV Area (PHT): 3.24 cm    SHUNTS MV Decel Time: 234 msec    Systemic VTI:  0.25 m MV E velocity: 85.70 cm/s  Systemic Diam: 2.00 cm MV A velocity: 82.70 cm/s MV E/A ratio:  1.04 Kirk Ruths MD Electronically signed by Kirk Ruths MD Signature Date/Time: 05/26/2022/12:44:40 PM    Final    EEG adult  Result Date: 05/26/2022 Lora Havens, MD     05/26/2022 10:56 AM Patient Name: KOKI BUXTON MRN: [id number] Epilepsy Attending: Lora Havens Referring Physician/Provider: Rosalin Hawking, MD Date: 05/26/2022 Duration: 22.15 mins Patient history: 66 year old male with a history of paroxysmal atrial fibrillation on Eliquis, seizures, 3 prior strokes and chronic left sided weakness secondary to prior stroke, who presents with acute onset of right  sided weakness, right sided sensory loss and dysarthria. EEG to evaluate for seizure. Level of alertness: Awake, asleep AEDs during EEG study: LEV, LCM Technical aspects: This EEG study was done with scalp electrodes positioned according to the 10-20 International system of electrode placement. Electrical activity was reviewed with band pass filter of 1-'70Hz'$ , sensitivity of 7 uV/mm, display speed of 46m/sec with a '60Hz'$  notched filter applied as appropriate. EEG data were recorded continuously and digitally stored.  Video monitoring was available and reviewed as appropriate. Description: The posterior dominant rhythm consists of 8 Hz activity of moderate voltage (25-35 uV) seen predominantly in posterior head regions, symmetric and reactive to eye opening and eye closing. Sleep was characterized by vertex waves, sleep spindles (12 to 14 Hz), maximal frontocentral region.  Hyperventilation and photic stimulation were not performed.   IMPRESSION: This study is within normal limits. No seizures or epileptiform discharges were seen throughout the recording. A normal interictal EEG does not exclude the diagnosis of epilepsy. [place]   PHYSICAL EXAM General: Pleasant, well-appearing  in bed. No acute distress. Skin: Warm and dry. No obvious rash or lesions. Neuro: A&Ox3. CN II-XII intact. No cerebellar abnormalities. Moves all extremities. Normal ROM. 5/5 strength in RUE, LUE, RLE,  and LLE. Normal sensation. No focal deficit. No dysarthria. Psych: Normal mood and affect   ASSESSMENT/PLAN  ALEJANDRO GAMEL is a 66 y.o. male with medical history significant of prior CVA with residual left-sided hemiparesis, morbid obesity, chronic diastolic heart failure, OSA on CPAP, type 2 diabetes, hypertension, hyperlipidemia, paroxysmal atrial fibrillation on Eliquis, suspected seizure on Keppra/Vimpat, COPD who presents with right-sided weakness, dysarthria.  Likely recrudescence of old deficits in the setting of  encephalopathy and hypotension Code Stroke CT head No acute abnormality. ASPECTS 10.    CTA head & neck no LVO MRI  normal 2D Echo EF 60 to 65% EEG normal LDL 80 HgbA1c 7.5 VTE prophylaxis - eliquis aspirin 81 mg daily and Eliquis (apixaban) daily prior to admission, continue Eliquis. Therapy recommendations: CIR Disposition:  home  History of seizure-like activity 10/2020 admitted for CHF, SOB and LLE cellulitis.  Reported seizure-like activity lasting 30 seconds with bilateral upper extremity jerking.  MRI/EEG negative.  Put on Keppra 1 g twice daily. Keppra continued for 1 months and self stopped 02/2021 admitted for left LE weakness and lethargy.  MRI showed no acute infarct.  MRI C-spine showed spinal stenosis, no spinal cord compression.  Discharged on outpatient PT/OT with plan for EMS/NCS as outpatient. 04/2021 to follow-up with Dr. April Manson at [facility] 04/2021 admitted for headache and left-sided weakness.  MRI and EEG negative. 08/2021 admitted for right hand jerking EEG negative.  TEE no PFO.  Put on Keppra 1 g twice daily 09/2021 follow with Dr. April Manson, decrease her Keppra XR 1 g daily 02/2022 admitted for left-sided weakness/numbness and headache.  CT no acute abnormality.  CT head and neck unremarkable.  MRI no acute infarct.  EF 60 to 65%, EEG negative.  LDL 62, A1c 8.0.  Concerning?  Partial seizure versus recrudescence from previous stroke.  Discharged on Keppra 500 twice daily and Vimpat 150 twice daily as well as aspirin 81 and Eliquis. Follows with Dr. April Manson at [facility]  Paroxysmal Atrial Fibrillation On Eliquis Rate controlled Continue Eliquis  Hypotension History of hypertension Home meds:  cardizem, metoprolol  Stable now orthostatic vitals today showed no orthostatic hypotension Long-term BP goal normotensive  Hyperlipidemia Home meds: lipitor 40, resumed in hospital LDL 80, goal < 70 Continue statin at discharge  Diabetes type II Uncontrolled Home meds:  metformin,  insulin HgbA1c 7.2, goal < 7.0 CBGs SSI Close PCP follow-up for better DM control  Other Stroke Risk Factors Advanced Age >/= 65  ETOH use, alcohol level <10, advised to drink no more than 2 drink(s) a day Obesity, Body mass index is 48.68 kg/m., BMI >/= 30 associated with increased stroke risk, recommend weight loss, diet and exercise as appropriate  Coronary artery disease Obstructive sleep apnea, on CPAP at home  Other Active Problems COPD   Hospital day # 1  Coralyn Pear, MD PGY-1 Psychiatry  We will sign off at this time. Please reconsult [place] if further concerns arise.   ATTENDING NOTE: I reviewed above note and agree with the assessment and plan. Pt was seen and examined.   No family is at the bedside. Pt is sitting at the edge of bed for lunch. He stated that he feels good and is at baseline. He did not know why he had stroke like symptoms but he did stated that he probably dehydrated. MRI brain again no acute infarcts. BP improved, negative orthostatic vitals. Continue eliquis and statin, continue keppra and vimpat. PT/OT recommend home health.   For detailed assessment  and plan, please refer to above/below as I have made changes wherever appropriate.   Neurology will sign off. Please call with questions. Pt will follow up with Dr. April Manson on 11/30 at [facility]. Thanks for the consult.   Rosalin Hawking, MD PhD Stroke Neurology 05/27/2022 2:31 PM     To contact Stroke Continuity provider, please refer to [email or web address] After hours, contact General Neurology

## 2022-05-31 ENCOUNTER — Other Ambulatory Visit (HOSPITAL_COMMUNITY): Payer: Self-pay

## 2022-06-03 DIAGNOSIS — G4700 Insomnia, unspecified: Secondary | ICD-10-CM | POA: Diagnosis not present

## 2022-06-03 DIAGNOSIS — I10 Essential (primary) hypertension: Secondary | ICD-10-CM | POA: Diagnosis not present

## 2022-06-03 DIAGNOSIS — E1165 Type 2 diabetes mellitus with hyperglycemia: Secondary | ICD-10-CM | POA: Diagnosis not present

## 2022-06-03 DIAGNOSIS — E785 Hyperlipidemia, unspecified: Secondary | ICD-10-CM | POA: Diagnosis not present

## 2022-06-07 ENCOUNTER — Encounter: Payer: Self-pay | Admitting: Neurology

## 2022-06-09 ENCOUNTER — Encounter: Payer: Self-pay | Admitting: Adult Health

## 2022-06-09 ENCOUNTER — Ambulatory Visit (INDEPENDENT_AMBULATORY_CARE_PROVIDER_SITE_OTHER): Payer: Medicare HMO | Admitting: Adult Health

## 2022-06-09 DIAGNOSIS — F331 Major depressive disorder, recurrent, moderate: Secondary | ICD-10-CM | POA: Diagnosis not present

## 2022-06-09 DIAGNOSIS — F411 Generalized anxiety disorder: Secondary | ICD-10-CM | POA: Diagnosis not present

## 2022-06-09 DIAGNOSIS — F410 Panic disorder [episodic paroxysmal anxiety] without agoraphobia: Secondary | ICD-10-CM

## 2022-06-09 DIAGNOSIS — G4700 Insomnia, unspecified: Secondary | ICD-10-CM | POA: Diagnosis not present

## 2022-06-09 DIAGNOSIS — F4310 Post-traumatic stress disorder, unspecified: Secondary | ICD-10-CM | POA: Diagnosis not present

## 2022-06-09 NOTE — Progress Notes (Signed)
Lawrence [name] [id number] 06/20/1956 66 y.o.  Subjective:   Patient ID:  Lawrence [name] is a 66 y.o. (DOB July 31, 1956) male.  Chief Complaint: No chief complaint on file.   HPI PRANISH AKHAVAN presents to the office today for follow-up of panic attacks, MDD, GAD, PTSD, and insomnia.  Previously followed by [place].  Describes mood today as "ok". Pleasant. Tearful sometimes. Mood symptoms - reports some depression - "about the same". Increased anxiety, and irritability. Denies recent panic attacks. Feels frustrated with limitations from recent stroke. Stating "I feel like I'm struggling some". Getting frustrated - "I can't get my words out". Recently hospitalized for another stroke - 4 strokes. Feels like current medications are helpful. He and partner doing well. Stable interest and motivation. Taking medications as prescribed.  Energy levels lower. Active, does not have a regular exercise routine.   Enjoys some usual interests and activities. Lives with significant other. No family local. Has a good friend locally. Appetite fair. Weight loss 365 from 390 pounds - high protein, low carb diet. Increase sleep - sleeping the majority of the day and through the night. Focus and concentration difficulties - worsening with recent stroke. Completing tasks. Managing aspects of household. Retired [name]. Disabled. Denies SI or HI.  Denies AH or VH.  Previous medication trials: Ambien    Flowsheet Row ED to Hosp-Admission (Discharged) from 05/25/2022 in [place] ED from 03/09/2022 in [place] ED to Hosp-Admission (Discharged) from 02/24/2022 in [facility]  C-SSRS RISK CATEGORY No Risk No Risk No Risk        Review of Systems:  Review of Systems  Musculoskeletal:  Negative for gait problem.  Neurological:  Negative for tremors.  Psychiatric/Behavioral:         Please refer to HPI    Medications: I have  reviewed the patient's current medications.  Current Outpatient Medications  Medication Sig Dispense Refill   acetaminophen (TYLENOL) 325 MG tablet Take 2 tablets (650 mg total) by mouth every 4 (four) hours as needed for mild pain (or temp > 37.5 C (99.5 F)).     albuterol (VENTOLIN HFA) 108 (90 Base) MCG/ACT inhaler Inhale 2 puffs into the lungs every 6 (six) hours as needed for wheezing or shortness of breath. 1 each 6   allopurinol (ZYLOPRIM) 100 MG tablet Take 200 mg by mouth daily.     ARIPiprazole (ABILIFY) 5 MG tablet TAKE ONE TABLET BY MOUTH EVERYDAY AT BEDTIME (Patient taking differently: Take 5 mg by mouth at bedtime.) 90 tablet 3   atorvastatin (LIPITOR) 40 MG tablet Take 1 tablet (40 mg total) by mouth every evening. TAKE ONE TABLET BY MOUTH DAILY AT 6PM (Patient taking differently: Take 40 mg by mouth every evening.) 90 tablet 3   busPIRone (BUSPAR) 10 MG tablet Take 1 tablet (10 mg total) by mouth 3 (three) times daily. 270 tablet 3   cetirizine (ZYRTEC) 10 MG tablet Take 10 mg by mouth at bedtime.      Cholecalciferol (VITAMIN D3 PO) Take 2,000 Units by mouth daily.     Continuous Blood Gluc Receiver (DEXCOM G7 RECEIVER) DEVI To display CGM data 1 each 0   Continuous Blood Gluc Sensor (DEXCOM G7 SENSOR) MISC 1 Device by Does not apply route as directed. Change sensor every 10 days 3 each 3   diltiazem (CARDIZEM CD) 120 MG 24 hr capsule Take 1 capsule (120 mg total) by mouth daily. [place]  capsule 0   ELIQUIS 5 MG TABS tablet TAKE ONE TABLET BY MOUTH TWICE DAILY (Patient taking differently: Take 5 mg by mouth 2 (two) times daily.) 180 tablet 2   FARXIGA 10 MG TABS tablet TAKE ONE TABLET BY MOUTH BEFORE BREAKFAST (Patient taking differently: Take 10 mg by mouth daily.) 90 tablet 3   ferrous sulfate 324 (65 Fe) MG TBEC Take 324 mg by mouth daily with breakfast.      fluticasone (FLONASE) 50 MCG/ACT nasal spray Place 2 sprays into both nostrils daily.      Fluticasone-Umeclidin-Vilant  (TRELEGY ELLIPTA) 200-62.5-25 MCG/ACT AEPB Inhale 1 puff into the lungs daily. (Patient taking differently: Inhale 2 puffs into the lungs daily.) 60 each 11   insulin degludec (TRESIBA FLEXTOUCH) 100 UNIT/ML FlexTouch Pen Inject 40 Units into the skin daily. 15 mL 3   isosorbide mononitrate (IMDUR) 30 MG 24 hr tablet TAKE ONE TABLET BY MOUTH EVERY EVENING (Patient taking differently: Take 30 mg by mouth every evening.) 90 tablet 3   lacosamide 150 MG TABS Take 1 tablet (150 mg total) by mouth 2 (two) times daily. 180 tablet 0   levETIRAcetam (KEPPRA) 500 MG tablet Take 1 tablet (500 mg total) by mouth 2 (two) times daily. 120 tablet 11   metFORMIN (GLUCOPHAGE-XR) 500 MG 24 hr tablet Take 4 tablets (2,000 mg total) by mouth daily. (Patient taking differently: Take 1,000 mg by mouth 2 (two) times daily with a meal.) 360 tablet 3   omeprazole (PRILOSEC) 20 MG capsule Take 20 mg by mouth every morning.     potassium chloride SA (KLOR-CON M) 20 MEQ tablet Take 2 tablets (40 mEq total) by mouth 2 (two) times daily. 270 tablet 3   prazosin (MINIPRESS) 2 MG capsule Take two tablets at bedtime. (Patient taking differently: Take 4 mg by mouth at bedtime.) 180 capsule 3   pregabalin (LYRICA) 150 MG capsule TAKE ONE CAPSULE BY MOUTH THREE TIMES DAILY 270 capsule 0   repaglinide (PRANDIN) 2 MG tablet Take 2 tablets (4 mg total) by mouth 2 (two) times daily before a meal. 360 tablet 3   spironolactone (ALDACTONE) 25 MG tablet Take 0.5 tablets (12.5 mg total) by mouth daily. 15 tablet 2   temazepam (RESTORIL) 7.5 MG capsule Take 1 capsule (7.5 mg total) by mouth at bedtime as needed for sleep. 30 capsule 0   tirzepatide (MOUNJARO) 5 MG/0.5ML Pen INJECT '5mg'$  FOR four WEEKS (Patient taking differently: Inject 5 mg into the skin once a week. Mondays) 2 mL 0   torsemide (DEMADEX) 20 MG tablet TAKE 4 TABLETS BY MOUTH EVERY MORNING and TAKE THREE TABLETS BY MOUTH EVERY EVENING (Patient taking differently: Take 60-80 mg by  mouth See admin instructions. Take 4 tablets ('80mg'$ ) by mouth every morning and three tablets ('60mg'$ ) by mouth every evening.) 450 tablet 3   VASCEPA 1 g capsule TAKE TWO CAPSULES BY MOUTH TWICE DAILY 120 capsule 5   venlafaxine XR (EFFEXOR-XR) 75 MG 24 hr capsule Take 3 capsules (225 mg total) by mouth daily with breakfast. 270 capsule 3   No current facility-administered medications for this visit.    Medication Side Effects: None  Allergies:  Allergies  Allergen Reactions   Vancomycin Other (See Comments)    "Red Man Syndrome" 02/02/17: possible cause for rash under both arms   Niacin Other (See Comments)   Niacin And Related Other (See Comments)    Red man syndrome   Tubersol [Tuberculin, Ppd] Other (See Comments)  Reaction unknown   Doxycycline Rash and Other (See Comments)    Past Medical History:  Diagnosis Date   Acquired dilation of ascending aorta and aortic root ([place])    21m by echo 01/2021   Adenomatous colon polyp 2007   Anemia    Anxiety    Aortic stenosis    moderate AS by echo 01/2021   Asthma    BPH without obstruction/lower urinary tract symptoms 02/22/2017   Chronic diastolic (congestive) heart failure (HCC)    Chronic venous stasis 03/07/2019   COPD (chronic obstructive pulmonary disease) (HCC)    Coronary artery calcification seen on CAT scan    Depression    Diabetic neuropathy ([facility] 09/11/2019   History of colon polyps 08/24/2018   Hypertension    Morbid obesity (HCC)    OSA (obstructive sleep apnea)    Pain due to onychomycosis of toenails of both feet 09/11/2019   Peripheral neuropathy 02/22/2017   Primary osteoarthritis, left shoulder 03/05/2017   PTSD (post-traumatic stress disorder)    Pure hypercholesterolemia    QT prolongation 03/07/2019   Seizures (HCC)    Shortness of breath    Sinus tachycardia 03/07/2019   Sleep apnea    CPAP   Type 2 diabetes mellitus with vascular disease ([facility] 09/11/2019    Past Medical History, Surgical  history, Social history, and Family history were reviewed and updated as appropriate.   Please see review of systems for further details on the patient's review from today.   Objective:   Physical Exam:  There were no vitals taken for this visit.  Physical Exam Constitutional:      General: He is not in acute distress. Musculoskeletal:        General: No deformity.  Neurological:     Mental Status: He is alert and oriented to person, place, and time.     Coordination: Coordination normal.  Psychiatric:        Attention and Perception: Attention and perception normal. He does not perceive auditory or visual hallucinations.        Mood and Affect: Mood normal. Mood is not anxious or depressed. Affect is not labile, blunt, angry or inappropriate.        Speech: Speech normal.        Behavior: Behavior normal.        Thought Content: Thought content normal. Thought content is not paranoid or delusional. Thought content does not include homicidal or suicidal ideation. Thought content does not include homicidal or suicidal plan.        Cognition and Memory: Cognition and memory normal.        Judgment: Judgment normal.     Comments: Insight intact     Lab Review:     Component Value Date/Time   NA 140 05/25/2022 2110   NA 136 01/07/2021 0000   K 4.0 05/25/2022 2110   CL 99 05/25/2022 2110   CO2 29 05/25/2022 2101   GLUCOSE 173 (H) 05/25/2022 2110   BUN 30 (H) 05/25/2022 2110   BUN 22 01/07/2021 0000   CREATININE 1.20 05/25/2022 2110   CALCIUM 9.1 05/25/2022 2101   PROT 6.9 05/25/2022 2101   ALBUMIN 3.7 05/25/2022 2101   AST 14 (L) 05/25/2022 2101   ALT 18 05/25/2022 2101   ALKPHOS 78 05/25/2022 2101   BILITOT 0.3 05/25/2022 2101   GFRNONAA >60 05/25/2022 2101   GFRAA 77 05/20/2020 1038       Component Value Date/Time   WBC 11.0 (H)  05/25/2022 2101   RBC 5.14 05/25/2022 2101   HGB 15.6 05/25/2022 2110   HCT 46.0 05/25/2022 2110   PLT 243 05/25/2022 2101   MCV 90.9  05/25/2022 2101   MCH 29.4 05/25/2022 2101   MCHC 32.3 05/25/2022 2101   RDW 15.1 05/25/2022 2101   LYMPHSABS 1.8 05/25/2022 2101   MONOABS 0.7 05/25/2022 2101   EOSABS 0.3 05/25/2022 2101   BASOSABS 0.0 05/25/2022 2101    No results found for: "POCLITH", "LITHIUM"   No results found for: "PHENYTOIN", "PHENOBARB", "VALPROATE", "CBMZ"   .res Assessment: Plan:    Plan:  PDMP reviewed  Prazosin '2mg'$  - 2 tabs at hs Temazepam 7.'5mg'$  at bedtime Abilify '5mg'$  daily Effexor XR '75mg'$  - 3 every morning Buspar '5mg'$  TID   D/C Trazadone '100mg'$  - 2 at bedtime  [place] - pain management - spinal stimulator  RTC 3 months  Patient advised to contact office with any questions, adverse effects, or acute worsening in signs and symptoms.  Discussed potential metabolic side effects associated with atypical antipsychotics, as well as potential risk for movement side effects. Advised pt to contact office if movement side effects occur.  There are no diagnoses linked to this encounter.   Please see After Visit Summary for patient specific instructions.  Future Appointments  Date Time Provider [place]  07/07/2022 11:45 AM Alric Ran, MD [facility] None  07/14/2022 10:30 AM Elayne Snare, MD [facility] None  07/29/2022  7:45 AM Gardiner Barefoot, DPM [facility]  12/01/2022  9:30 AM Lomax, Amy, NP [facility] None    No orders of the defined types were placed in this encounter.   -------------------------------

## 2022-06-10 DIAGNOSIS — E1165 Type 2 diabetes mellitus with hyperglycemia: Secondary | ICD-10-CM | POA: Diagnosis not present

## 2022-06-13 ENCOUNTER — Other Ambulatory Visit: Payer: Self-pay | Admitting: Endocrinology

## 2022-06-13 DIAGNOSIS — E1159 Type 2 diabetes mellitus with other circulatory complications: Secondary | ICD-10-CM

## 2022-06-16 ENCOUNTER — Other Ambulatory Visit: Payer: Self-pay | Admitting: Neurology

## 2022-06-20 ENCOUNTER — Telehealth: Payer: Self-pay | Admitting: Adult Health

## 2022-06-20 NOTE — Telephone Encounter (Signed)
Next visit is 09/08/22. Lawrence [name] was admitted to the hospital on 05/25/22 by Holli Humbles. When he ran out of Temazepam which he was taking 15 mg, Dr. Avon Gully decreased it to 7.5 mg a few days ago. He has had sleepiness with the lower dose. Please call him at [phone].

## 2022-06-21 NOTE — Telephone Encounter (Signed)
Will continue at 7.'5mg'$  at hs.

## 2022-06-21 NOTE — Telephone Encounter (Signed)
I will contact pharmacy. Looks like the hospital doctor wrote for it on 05/27/22 with no refills. If he's trying to fill it early it won't pay for it. Not sure how many he took extra so that may be the issue.

## 2022-06-21 NOTE — Telephone Encounter (Signed)
Pt stated his GI dr decreased his temazepam and he is not sure why because he does not prescribe it.He has not been able to sleep on 7.5 mg so he took 2.He wants an rx sent for 15 mg [facility]

## 2022-06-21 NOTE — Telephone Encounter (Signed)
The medication was decreased during hospitalization. There is also a pharmacy record of daytime somnolence. Let's continue the Temazepam at 7.'5mg'$  - it will take some time to adjust to the dose change.

## 2022-06-21 NOTE — Telephone Encounter (Signed)
LVM to rtc 

## 2022-06-21 NOTE — Telephone Encounter (Signed)
Pt stated insurance will not cover 7.5 mg but will cover 15 mg.Will a PA help?

## 2022-06-24 ENCOUNTER — Other Ambulatory Visit: Payer: Self-pay | Admitting: Neurology

## 2022-06-24 ENCOUNTER — Encounter: Payer: Self-pay | Admitting: Neurology

## 2022-06-24 MED ORDER — LACOSAMIDE 150 MG PO TABS: 150.0000 mg | ORAL_TABLET | Freq: Two times a day (BID) | ORAL | 0 refills | Status: DC

## 2022-07-07 ENCOUNTER — Encounter: Payer: Self-pay | Admitting: Neurology

## 2022-07-07 ENCOUNTER — Ambulatory Visit (INDEPENDENT_AMBULATORY_CARE_PROVIDER_SITE_OTHER): Payer: Medicare HMO | Admitting: Neurology

## 2022-07-07 VITALS — BP 110/76 | HR 97 | Ht 74.0 in

## 2022-07-07 DIAGNOSIS — G40909 Epilepsy, unspecified, not intractable, without status epilepticus: Secondary | ICD-10-CM

## 2022-07-07 DIAGNOSIS — E1142 Type 2 diabetes mellitus with diabetic polyneuropathy: Secondary | ICD-10-CM

## 2022-07-07 DIAGNOSIS — G4733 Obstructive sleep apnea (adult) (pediatric): Secondary | ICD-10-CM | POA: Diagnosis not present

## 2022-07-07 NOTE — Progress Notes (Signed)
[facility]  PATIENT: Lawrence [name] DOB: [birth year]  REFERRING CLINICIAN: [name], PA-C HISTORY FROM: Patient and sister Harriette Ohara FOR VISIT: [facility] - Inpatient follow up   HISTORICAL  CHIEF COMPLAINT:  Chief Complaint  Patient presents with   Follow-up    Rm 12. Accompanied by sister, Lawrence [name]. Pt reports "shaking" in arms and legs, going into a daze.   INTERVAL HISTORY 07/07/22:  Lawrence [name] presents today for follow-up, he is accompanied by his sister Lawrence [name].  In term of the seizures, he denies any seizures but stated that lately has been having episodes of spasms, his arm will jerk uncontrollably and this can last up to an hour.  He is compliant with his Keppra and Vimpat.  He reports neuropathy pain is getting better after the spinal stimulator.  He is still on Lyrica 150 mg 3 times daily.  He continues with home PT 3 days a week and also once a week he goes for [employer] classes.    He does report some issues with short-term memory, he is following with his doctors including psychiatry for depression.  Last month he was admitted to the hospital for right-sided weakness and slurred speech and his MRI was negative for any acute stroke.  His symptoms continued to improve and he was discharged home.   Denies any additional episodes of slurred speech, word finding difficulty or focal weakness. He reports last night, his leg gave out (left leg) and he fell, denies any major injury.    INTERVAL HISTORY 03/24/2022 Patient presents today with partner [name]. Since last visit in February, he has been doing well in term of seizures, on Keppra 500 mg BID until August 2nd when he had a breakthrough seizure. He was seen in the ED and Vimpat 150 mg BID. He reports increase sleepiness while on Vimpat. He reports another breakthough seizure last night but states that yesterday was a very busy and stressful day. Interval for sleep apnea, patient was seen and  diagnosed with sleep apnea on CPAP but stated she had trouble using the CPAP machine at night and therefore has not been using it for quite some time. He also have a spinal cord stimulator for his peripheral neuropathy, reports that it is working very well for him.   INTERVAL HISTORY 09/10/2021:  Patient presents today for follow-up with Remonia Richter, last visit was in September at that time he did stop his Keppra and did not have any jerking motion of the right arm therefore plan was to observe patient off [place].  Since then he has been doing fine until December 31 when he presented to the hospital due to altered mental status.  He was admitted for Toxic metabolic encephalopathy, severe sepsis in the setting of pneumonia and acute hypoxic respiratory failure.  During this time he was noted to have right arm jerking and period of unresponsiveness.  He had a routine EEG which did not show any epileptiform discharge and no seizures.  Plan was to restart the patient on Keppra but 1000 mg twice daily.  Since discharge from the hospital patient has been reported increased daytime somnolence.  He reported he is very difficult for him to for him to stay up, he felt like a zombie and also have issue with his memory.  He still dealing with peripheral neuropathy and is on Lyrica 150 mg 3 times daily and also has severe sleep apnea.  No seizure-like activity since being home.    HISTORY OF  PRESENT ILLNESS:  This is a 66 year old man with past medical history of obesity, obstructive sleep apnea, diastolic heart failure, hypertension, hyperlipidemia, diabetes who is presenting with seizure-like activity.  Patient stated that he was admitted in March of this year for pulmonary edema, acute hypoxic respiratory failure, and left lower extremity cellulitis.  During his admission he was noted to have seizure-like activity.  Wife who is present today mentioned that patient started having tremor like activity in the upper  extremity lasted about 30 seconds.  He only had 1 episode.  He was evaluated for seizure-like activity.  He had MRI which was negative and EEG also which was negative.  He was started on Keppra 1000 mg twice daily.  Patient stated after discharge from the hospital he took the medication for 1 month and self discontinued.  Since leaving the hospital he has not had any seizure-like activity.  Denies any previous seizure.  Denies any family history of seizures, denies any seizures factor.  His MRI was negative for any acute stroke or chronic stroke .   Patient also report continued lower extremity pain and numbness.  He has a history of diabetic neuropathy and he is on pregabalin 100 mg 3 times daily.  His hemoglobin A1c is still elevated, last hemoglobin A1c was 8.  He is on both insulin and oral meds for his diabetes. Other issues discussed today he is asleep.  He had he does have a diagnosis of obstructive sleep apnea he is on a CPAP machine, reported he take he uses CPAP machine but still has been getting restful sleep..  The patient reported last time he has a steep CPAP machine checked was about 3 years ago.  He has not seen a sleep doctor since   Handedness: Right handed   Seizure Type: Unclear,   Current frequency: Only once   Any injuries from seizures:   Seizure risk factors: No seizure risk factor  Previous ASMs: Levetiracetam, Lacosamide   Currenty ASMs: Levetiracetam, Lacosamide   ASMs side effects: None   Brain Images: Normal MRI   Previous EEGs: Normal EEG   OTHER MEDICAL CONDITIONS: Obesity, OSA, HTN, DM, HLD, COPD, Diabetic neuropathy, Diastolic heart failure   REVIEW OF SYSTEMS: Full 14 system review of systems performed and negative with exception of: as noted in the HPI  ALLERGIES: Allergies  Allergen Reactions   Vancomycin Other (See Comments)    "Red Man Syndrome" 02/02/17: possible cause for rash under both arms   Niacin Other (See Comments)   Niacin And  Related Other (See Comments)    Red man syndrome   Tubersol [Tuberculin, Ppd] Other (See Comments)    Reaction unknown   Doxycycline Rash and Other (See Comments)    HOME MEDICATIONS: Outpatient Medications Prior to Visit  Medication Sig Dispense Refill   acetaminophen (TYLENOL) 325 MG tablet Take 2 tablets (650 mg total) by mouth every 4 (four) hours as needed for mild pain (or temp > 37.5 C (99.5 F)).     albuterol (VENTOLIN HFA) 108 (90 Base) MCG/ACT inhaler Inhale 2 puffs into the lungs every 6 (six) hours as needed for wheezing or shortness of breath. 1 each 6   allopurinol (ZYLOPRIM) 100 MG tablet Take 200 mg by mouth daily.     ARIPiprazole (ABILIFY) 5 MG tablet TAKE ONE TABLET BY MOUTH EVERYDAY AT BEDTIME (Patient taking differently: Take 5 mg by mouth at bedtime.) 90 tablet 3   atorvastatin (LIPITOR) 40 MG tablet Take 1 tablet (40  mg total) by mouth every evening. TAKE ONE TABLET BY MOUTH DAILY AT 6PM (Patient taking differently: Take 40 mg by mouth every evening.) 90 tablet 3   busPIRone (BUSPAR) 10 MG tablet Take 1 tablet (10 mg total) by mouth 3 (three) times daily. 270 tablet 3   cetirizine (ZYRTEC) 10 MG tablet Take 10 mg by mouth at bedtime.      Cholecalciferol (VITAMIN D3 PO) Take 2,000 Units by mouth daily.     diltiazem (CARDIZEM CD) 120 MG 24 hr capsule Take 1 capsule (120 mg total) by mouth daily. 30 capsule 0   ELIQUIS 5 MG TABS tablet TAKE ONE TABLET BY MOUTH TWICE DAILY (Patient taking differently: Take 5 mg by mouth 2 (two) times daily.) 180 tablet 2   FARXIGA 10 MG TABS tablet TAKE ONE TABLET BY MOUTH BEFORE BREAKFAST (Patient taking differently: Take 10 mg by mouth daily.) 90 tablet 3   ferrous sulfate 324 (65 Fe) MG TBEC Take 324 mg by mouth daily with breakfast.      fluticasone (FLONASE) 50 MCG/ACT nasal spray Place 2 sprays into both nostrils daily.      Fluticasone-Umeclidin-Vilant (TRELEGY ELLIPTA) 200-62.5-25 MCG/ACT AEPB Inhale 1 puff into the lungs daily.  (Patient taking differently: Inhale 2 puffs into the lungs daily.) 60 each 11   insulin degludec (TRESIBA FLEXTOUCH) 100 UNIT/ML FlexTouch Pen Inject 40 Units into the skin daily. 15 mL 3   isosorbide mononitrate (IMDUR) 30 MG 24 hr tablet TAKE ONE TABLET BY MOUTH EVERY EVENING (Patient taking differently: Take 30 mg by mouth every evening.) 90 tablet 3   Lacosamide 150 MG TABS Take 1 tablet (150 mg total) by mouth 2 (two) times daily. 180 tablet 0   levETIRAcetam (KEPPRA) 500 MG tablet Take 1 tablet (500 mg total) by mouth 2 (two) times daily. 120 tablet 11   metFORMIN (GLUCOPHAGE-XR) 500 MG 24 hr tablet Take 4 tablets (2,000 mg total) by mouth daily. (Patient taking differently: Take 1,000 mg by mouth 2 (two) times daily with a meal.) 360 tablet 3   MOUNJARO 5 MG/0.5ML Pen INJECT '5mg'$  FOR four WEEKS 2 mL 0   omeprazole (PRILOSEC) 20 MG capsule Take 20 mg by mouth every morning.     potassium chloride SA (KLOR-CON M) 20 MEQ tablet Take 2 tablets (40 mEq total) by mouth 2 (two) times daily. 270 tablet 3   prazosin (MINIPRESS) 2 MG capsule Take two tablets at bedtime. (Patient taking differently: Take 4 mg by mouth at bedtime.) 180 capsule 3   pregabalin (LYRICA) 150 MG capsule TAKE ONE CAPSULE BY MOUTH THREE TIMES DAILY 270 capsule 0   repaglinide (PRANDIN) 2 MG tablet Take 2 tablets (4 mg total) by mouth 2 (two) times daily before a meal. 360 tablet 3   spironolactone (ALDACTONE) 25 MG tablet Take 0.5 tablets (12.5 mg total) by mouth daily. 15 tablet 2   temazepam (RESTORIL) 7.5 MG capsule Take 1 capsule (7.5 mg total) by mouth at bedtime as needed for sleep. 30 capsule 0   torsemide (DEMADEX) 20 MG tablet TAKE 4 TABLETS BY MOUTH EVERY MORNING and TAKE THREE TABLETS BY MOUTH EVERY EVENING (Patient taking differently: Take 60-80 mg by mouth See admin instructions. Take 4 tablets ('80mg'$ ) by mouth every morning and three tablets ('60mg'$ ) by mouth every evening.) 450 tablet 3   VASCEPA 1 g capsule TAKE TWO  CAPSULES BY MOUTH TWICE DAILY 120 capsule 5   venlafaxine XR (EFFEXOR-XR) 75 MG 24 hr capsule Take 3  capsules (225 mg total) by mouth daily with breakfast. 270 capsule 3   Continuous Blood Gluc Receiver (DEXCOM G7 RECEIVER) DEVI To display CGM data 1 each 0   Continuous Blood Gluc Sensor (DEXCOM G7 SENSOR) MISC 1 Device by Does not apply route as directed. Change sensor every 10 days 3 each 3   No facility-administered medications prior to visit.    PAST MEDICAL HISTORY: Past Medical History:  Diagnosis Date   Acquired dilation of ascending aorta and aortic root ([place])    50m by echo 01/2021   Adenomatous colon polyp 2007   Anemia    Anxiety    Aortic stenosis    moderate AS by echo 01/2021   Asthma    BPH without obstruction/lower urinary tract symptoms 02/22/2017   Chronic diastolic (congestive) heart failure (HCC)    Chronic venous stasis 03/07/2019   COPD (chronic obstructive pulmonary disease) (HCC)    Coronary artery calcification seen on CAT scan    Depression    Diabetic neuropathy ([facility] 09/11/2019   History of colon polyps 08/24/2018   Hypertension    Morbid obesity (HCC)    OSA (obstructive sleep apnea)    Pain due to onychomycosis of toenails of both feet 09/11/2019   Peripheral neuropathy 02/22/2017   Primary osteoarthritis, left shoulder 03/05/2017   PTSD (post-traumatic stress disorder)    Pure hypercholesterolemia    QT prolongation 03/07/2019   Seizures (HCC)    Shortness of breath    Sinus tachycardia 03/07/2019   Sleep apnea    CPAP   Type 2 diabetes mellitus with vascular disease ([facility] 09/11/2019    PAST SURGICAL HISTORY: Past Surgical History:  Procedure Laterality Date   ENDOVENOUS ABLATION SAPHENOUS VEIN W/ LASER Right 08/20/2020   endovenous laser ablation right greater saphenous vein by CGae GallopMD    JOINT REPLACEMENT     left knee replacement x 2   KNEE ARTHROSCOPY Bilateral    LEFT HEART CATH AND CORONARY ANGIOGRAPHY N/A 01/17/2018    Procedure: LEFT HEART CATH AND CORONARY ANGIOGRAPHY;  Surgeon: HLeonie Man MD;  [facility];  Service: Cardiovascular;  Laterality: N/A;   PRESSURE SENSOR/CARDIOMEMS N/A 08/26/2021   Procedure: PRESSURE SENSOR/CARDIOMEMS;  Surgeon: MLarey Dresser MD;  [facility];  Service: Cardiovascular;  Laterality: N/A;   RIGHT HEART CATH N/A 08/26/2021   Procedure: RIGHT HEART CATH;  Surgeon: MLarey Dresser MD;  [facility];  Service: Cardiovascular;  Laterality: N/A;   TEE WITHOUT CARDIOVERSION N/A 08/26/2021   Procedure: TRANSESOPHAGEAL ECHOCARDIOGRAM (TEE);  Surgeon: MLarey Dresser MD;  [facility];  Service: Cardiovascular;  Laterality: N/A;   UMBILICAL HERNIA REPAIR      FAMILY HISTORY: Family History  Problem Relation Age of Onset   CAD Maternal Grandfather    Diabetes Other    Diabetes Mellitus II Neg Hx    Colon cancer Neg Hx    Esophageal cancer Neg Hx    Inflammatory bowel disease Neg Hx    Liver disease Neg Hx    Pancreatic cancer Neg Hx    Rectal cancer Neg Hx    Stomach cancer Neg Hx    Sleep apnea Neg Hx     SOCIAL HISTORY: Social History   Socioeconomic History   Marital status: Significant Other    Spouse name: JJone Baseman  Number of children: 1   Years of education: Not on file   Highest education level: Associate  degree: academic program  Occupational History   Occupation: retired    Comment: Artist  Tobacco Use   Smoking status: Former    Packs/day: 1.00    Years: 44.00    Total pack years: 44.00    Types: Cigarettes    Quit date: 03/07/2016    Years since quitting: 6.3    Passive exposure: Past   Smokeless tobacco: Never  Vaping Use   Vaping Use: Never used  Substance and Sexual Activity   Alcohol use: Yes    Comment: rare   Drug use: No   Sexual activity: Not on file  Other Topics Concern   Not on file  Social History Narrative   Lives with wife [name]   Right  Handed   Drinks 1-2 cups caffeine   Social Determinants of Health   Financial Resource Strain: Medium Risk (06/08/2021)   Overall Financial Resource Strain (CARDIA)    Difficulty of Paying Living Expenses: Somewhat hard  Food Insecurity: Food Insecurity Present (06/08/2021)   Hunger Vital Sign    Worried About Running Out of Food in the Last Year: Sometimes true    Ran Out of Food in the Last Year: Never true  Transportation Needs: Unmet Transportation Needs (06/08/2021)   PRAPARE - Hydrologist (Medical): Yes    Lack of Transportation (Non-Medical): No  Physical Activity: Not on file  Stress: Not on file  Social Connections: Not on file  Intimate Partner Violence: Not on file     PHYSICAL EXAM  GENERAL EXAM/CONSTITUTIONAL: Vitals:  Vitals:   07/07/22 1135  BP: 110/76  Pulse: 97  Height: '6\' 2"'$  (1.88 m)    Body mass index is 47.38 kg/m. Wt Readings from Last 3 Encounters:  05/25/22 (!) 369 lb (167.4 kg)  03/31/22 (!) 341 lb (154.7 kg)  03/23/22 (!) 373 lb (169.2 kg)   Patient is in no distress; well developed, nourished and groomed; neck is supple. He is morbidly obese. Flat affect   EYES: Visual fields full to confrontation, Extraocular movements intacts,   MUSCULOSKELETAL: Gait, strength, tone, movements noted in Neurologic exam below  NEUROLOGIC: MENTAL STATUS:  awake, alert, oriented to person, place and time recent and remote memory intact normal attention and concentration language fluent, comprehension intact, naming intact fund of knowledge appropriate  CRANIAL NERVE:  2nd, 3rd, 4th, 6th -visual fields full to confrontation, extraocular muscles intact, no nystagmus 5th - facial sensation symmetric 7th - facial strength symmetric 8th - hearing intact 9th - palate elevates symmetrically, uvula midline 11th - shoulder shrug symmetric 12th - tongue protrusion midline  MOTOR:  normal bulk and tone, at least antigravity in  the BUE and BLE. LLE weak when compared to the right   SENSORY:  Decrease sensation to light touch  COORDINATION:  finger-nose-finger, fine finger movements normal  GAIT/STATION:  Deferred    DIAGNOSTIC DATA (LABS, IMAGING, TESTING) - I reviewed patient records, labs, notes, testing and imaging myself where available.  Lab Results  Component Value Date   WBC 11.0 (H) 05/25/2022   HGB 15.6 05/25/2022   HCT 46.0 05/25/2022   MCV 90.9 05/25/2022   PLT 243 05/25/2022      Component Value Date/Time   NA 140 05/25/2022 2110   NA 136 01/07/2021 0000   K 4.0 05/25/2022 2110   CL 99 05/25/2022 2110   CO2 29 05/25/2022 2101   GLUCOSE 173 (H) 05/25/2022 2110   BUN 30 (H) 05/25/2022 2110  BUN 22 01/07/2021 0000   CREATININE 1.20 05/25/2022 2110   CALCIUM 9.1 05/25/2022 2101   PROT 6.9 05/25/2022 2101   ALBUMIN 3.7 05/25/2022 2101   AST 14 (L) 05/25/2022 2101   ALT 18 05/25/2022 2101   ALKPHOS 78 05/25/2022 2101   BILITOT 0.3 05/25/2022 2101   GFRNONAA >60 05/25/2022 2101   GFRAA 77 05/20/2020 1038   Lab Results  Component Value Date   CHOL 150 05/26/2022   HDL 30 (L) 05/26/2022   LDLCALC 80 05/26/2022   TRIG 200 (H) 05/26/2022   Lab Results  Component Value Date   HGBA1C 7.2 (H) 05/26/2022   Lab Results  Component Value Date   VITAMINB12 223 05/26/2022   Lab Results  Component Value Date   TSH 1.182 05/26/2022   MRI Brain 05/26/22 Normal brain MRI for age. No acute intracranial infarct or other abnormality.  MRI Brain 02/08/2021 No evidence of recent infarction, hemorrhage, or mass. No significant change since recent prior study.  EEG 01/26/2021: Normal routine EEG  EEG 04/27/2021: This study is within normal limits. No seizures or epileptiform discharges were seen throughout the recording. If suspicion for ictal-interictal activity remains a concern, a prolonged study can  be considered.   EEG 08/09/2021: This study is within normal limits. No seizures or  epileptiform discharges were seen throughout the recording.   I personally reviewed brain Images and previous EEG reports.   ASSESSMENT AND PLAN  66 y.o. year old male  with morbid obesity, hypertension, hyperlipidemia, diabetes mellitus type 2, diabetes neuropathy, diastolic heart failure, seizure disorder who is presenting for seizure follow up.  No seizures since last visit but do report episodes of jerk like movement of the arms that can last up to an hour.  There is also maintenance of awareness during these episodes.  Patient will provide a video.  At the [name], we will continue Keppra 500 mg twice daily and Vimpat 150 mg twice daily.  In terms of the tiredness/fatigue, he is on Lyrica 150 mg 3 times daily but since having the spinal stimulator his neuropathy pain improved, I did advise him to decrease the Lyrica to 150 mg twice daily and assess the pain.  They will contact me if the pain returns after decreasing the Lyrica to twice daily.  Follow-up in 61-month or sooner if worse.    1. Seizure disorder ([facility]   2. Diabetic peripheral neuropathy ([place]   3. OSA on CPAP      Patient Instructions  Decrease Lyrica to 150 mg twice daily  Increase Physical Therapy  Send [place] video of the spasms that you have been experiencing  Continue Keppra and Vimpat  Continue your other medications  Continue with CPAP during sleep  Follow up in 3 months     Per [facility], patients with seizures are not allowed to drive until they have been seizure-free for six months.  Other recommendations include using caution when using heavy equipment or power tools. Avoid working on ladders or at heights. Take showers instead of baths.  Do not swim alone.  Ensure the water temperature is not too high on the home water heater. Do not go swimming alone. Do not lock yourself in a room alone (i.e. bathroom). When caring for infants or small children, sit down when holding, feeding, or changing  them to minimize risk of injury to the child in the event you have a seizure. Maintain good sleep hygiene. Avoid alcohol.  Also recommend  adequate sleep, hydration, good diet and minimize stress.   During the Seizure  - First, ensure adequate ventilation and place patients on the floor on their left side  Loosen clothing around the neck and ensure the airway is patent. If the patient is clenching the teeth, do not force the mouth open with any object as this can cause severe damage - Remove all items from the surrounding that can be hazardous. The patient may be oblivious to what's happening and may not even know what he or she is doing. If the patient is confused and wandering, either gently guide him/her away and block access to outside areas - Reassure the individual and be comforting - Call 911. In most cases, the seizure ends before EMS arrives. However, there are cases when seizures may last over 3 to 5 minutes. Or the individual may have developed breathing difficulties or severe injuries. If a pregnant patient or a person with diabetes develops a seizure, it is prudent to call an ambulance. - Finally, if the patient does not regain full consciousness, then call EMS. Most patients will remain confused for about 45 to 90 minutes after a seizure, so you must use judgment in calling for help. - Avoid restraints but make sure the patient is in a bed with padded side rails - Place the individual in a lateral position with the neck slightly flexed; this will help the saliva drain from the mouth and prevent the tongue from falling backward - Remove all nearby furniture and other hazards from the area - Provide verbal assurance as the individual is regaining consciousness - Provide the patient with privacy if possible - Call for help and start treatment as ordered by the caregiver   After the Seizure (Postictal Stage)  After a seizure, most patients experience confusion, fatigue, muscle pain  and/or a headache. Thus, one should permit the individual to sleep. For the next few days, reassurance is essential. Being calm and helping reorient the person is also of importance.  Most seizures are painless and end spontaneously. Seizures are not harmful to others but can lead to complications such as stress on the lungs, brain and the heart. Individuals with prior lung problems may develop labored breathing and respiratory distress.     No orders of the defined types were placed in this encounter.    No orders of the defined types were placed in this encounter.   Return in about 3 months (around 10/06/2022).    Alric Ran, MD 07/07/2022, 3:13 PM  [facility] [address] [place], [place] [address] [phone]

## 2022-07-07 NOTE — Patient Instructions (Addendum)
Decrease Lyrica to 150 mg twice daily  Increase Physical Therapy  Send [place] the video of the spasms that you have been experiencing  Continue Keppra and Vimpat  Continue your other medications  Continue with CPAP during sleep  Follow up in 3 months

## 2022-07-12 ENCOUNTER — Other Ambulatory Visit: Payer: Self-pay | Admitting: Endocrinology

## 2022-07-12 DIAGNOSIS — E1159 Type 2 diabetes mellitus with other circulatory complications: Secondary | ICD-10-CM

## 2022-07-14 ENCOUNTER — Ambulatory Visit: Payer: No Typology Code available for payment source | Admitting: Endocrinology

## 2022-07-14 ENCOUNTER — Telehealth: Payer: Self-pay | Admitting: Adult Health

## 2022-07-14 DIAGNOSIS — G4700 Insomnia, unspecified: Secondary | ICD-10-CM

## 2022-07-14 MED ORDER — MIRTAZAPINE 15 MG PO TABS: 15.0000 mg | ORAL_TABLET | Freq: Every day | ORAL | 0 refills | Status: DC

## 2022-07-14 NOTE — Telephone Encounter (Signed)
Pt LVM @ 1:01p.  He just said he wanted to talk to Barnett Applebaum about his medications.  Next appt 2/1

## 2022-07-14 NOTE — Telephone Encounter (Signed)
Patient called and said the 7.5 mg mirtazapine is not helping him sleep, said he is up "90%" of the night. He would like to take 15 mg. Pending Rx for your approval.

## 2022-07-25 ENCOUNTER — Ambulatory Visit (INDEPENDENT_AMBULATORY_CARE_PROVIDER_SITE_OTHER): Payer: Medicare HMO | Admitting: Endocrinology

## 2022-07-25 VITALS — Ht 74.0 in | Wt 369.0 lb

## 2022-07-25 DIAGNOSIS — E1165 Type 2 diabetes mellitus with hyperglycemia: Secondary | ICD-10-CM

## 2022-07-25 DIAGNOSIS — Z794 Long term (current) use of insulin: Secondary | ICD-10-CM

## 2022-07-25 MED ORDER — TIRZEPATIDE 10 MG/0.5ML ~~LOC~~ SOAJ: 10.0000 mg | SUBCUTANEOUS | 1 refills | Status: DC

## 2022-07-25 NOTE — Progress Notes (Signed)
Patient ID: Lawrence [name], male   DOB: [birth year], 66 y.o.   MRN: [id number]           Reason for Appointment: Type II Diabetes follow-up   History of Present Illness   Diagnosis date: 2018  Previous history: A1c in the past has ranged from 7.1-9.4  He has been on progressively increasing number of medications for his diabetes over the last 5 years or so Insulin was apparently used with small doses of insulin for 2 months in 2022 Usually A1c has not been below 7   Recent history:     Non-insulin hypoglycemic drugs: Farxiga 10 mg daily, metformin ER 2 g daily, Prandin 4 mg twice daily,      Insulin regimen: Tresiba 44 units daily    Side effects from medications: None  Current self management, blood sugar patterns and problems identified:  His A1c is 7.2 in October  He has not been seen since August He says he had gone up to 44 units after his last visit but unable to review his blood sugar data today  He is using the freestyle libre version 2 and did not bring his reader  He did apparently have a low sugar alert about a week ago during the night without symptoms but also has had another low sugar previously which correlated with the fingerstick He does not think his blood sugars are higher consistently after meals but unable to review his patterns  Also does not feel any different with taking Mounjaro compared to Trulicity, he is still on 5 mg weekly however and does not think it feels any different with this compared to Trulicity, also no side effects Not able to exercise Difficult to assess his correct weight   Exercise: Unable to, in wheelchair      Monitors blood glucose: 4x daily on freestyle libre  Blood Glucose readings from recall:   PRE-MEAL Fasting Lunch Dinner Bedtime Overall  Glucose range: 110-120  110-170    Mean/median:     ? 169   POST-MEAL PC Breakfast PC Lunch PC Dinner  Glucose range: 173    Mean/median:      Prior readings:   PRE-MEAL  Fasting Lunch Dinner Bedtime Overall  Glucose range: 106-306      Mean/median:     165   POST-MEAL PC Breakfast PC Lunch PC Dinner  Glucose range:     Mean/median:                Dietician visit: Most recent: Years ago Weight control:  Wt Readings from Last 3 Encounters:  07/25/22 (!) 369 lb (167.4 kg)  05/25/22 (!) 369 lb (167.4 kg)  03/31/22 (!) 341 lb (154.7 kg)          Complications:   Vascular disease, neuropathy  Diabetes labs:  Lab Results  Component Value Date   HGBA1C 7.2 (H) 05/26/2022   HGBA1C 7.5 (A) 03/23/2022   HGBA1C 8.0 (A) 10/14/2021   Lab Results  Component Value Date   MICROALBUR <0.7 03/23/2022   Destrehan 80 05/26/2022   CREATININE 1.20 05/25/2022     Allergies as of 07/25/2022       Reactions   Vancomycin Other (See Comments)   "Red Man Syndrome" 02/02/17: possible cause for rash under both arms   Niacin Other (See Comments)   Niacin And Related Other (See Comments)   Red man syndrome   Tubersol [tuberculin, Ppd] Other (See Comments)   Reaction unknown   Doxycycline Rash,  Other (See Comments)        Medication List        Accurate as of July 25, 2022  4:37 PM. If you have any questions, ask your nurse or doctor.          STOP taking these medications    Mounjaro 5 MG/0.5ML Pen Generic drug: tirzepatide Replaced by: tirzepatide 10 MG/0.5ML Pen       TAKE these medications    acetaminophen 325 MG tablet Commonly known as: TYLENOL Take 2 tablets (650 mg total) by mouth every 4 (four) hours as needed for mild pain (or temp > 37.5 C (99.5 F)).   albuterol 108 (90 Base) MCG/ACT inhaler Commonly known as: VENTOLIN HFA Inhale 2 puffs into the lungs every 6 (six) hours as needed for wheezing or shortness of breath.   allopurinol 100 MG tablet Commonly known as: ZYLOPRIM Take 200 mg by mouth daily.   ARIPiprazole 5 MG tablet Commonly known as: ABILIFY TAKE ONE TABLET BY MOUTH EVERYDAY AT BEDTIME What changed:  how  much to take how to take this when to take this additional instructions   atorvastatin 40 MG tablet Commonly known as: LIPITOR Take 1 tablet (40 mg total) by mouth every evening. TAKE ONE TABLET BY MOUTH DAILY AT 6PM What changed: additional instructions   busPIRone 10 MG tablet Commonly known as: BUSPAR Take 1 tablet (10 mg total) by mouth 3 (three) times daily.   cetirizine 10 MG tablet Commonly known as: ZYRTEC Take 10 mg by mouth at bedtime.   diltiazem 120 MG 24 hr capsule Commonly known as: CARDIZEM CD Take 1 capsule (120 mg total) by mouth daily.   Eliquis 5 MG Tabs tablet Generic drug: apixaban TAKE ONE TABLET BY MOUTH TWICE DAILY What changed: how much to take   Farxiga 10 MG Tabs tablet Generic drug: dapagliflozin propanediol TAKE ONE TABLET BY MOUTH BEFORE BREAKFAST What changed: See the new instructions.   ferrous sulfate 324 (65 Fe) MG Tbec Take 324 mg by mouth daily with breakfast.   fluticasone 50 MCG/ACT nasal spray Commonly known as: FLONASE Place 2 sprays into both nostrils daily.   isosorbide mononitrate 30 MG 24 hr tablet Commonly known as: IMDUR TAKE ONE TABLET BY MOUTH EVERY EVENING   Lacosamide 150 MG Tabs Take 1 tablet (150 mg total) by mouth 2 (two) times daily.   levETIRAcetam 500 MG tablet Commonly known as: KEPPRA Take 1 tablet (500 mg total) by mouth 2 (two) times daily.   metFORMIN 500 MG 24 hr tablet Commonly known as: GLUCOPHAGE-XR Take 4 tablets (2,000 mg total) by mouth daily. What changed:  how much to take when to take this   mirtazapine 15 MG tablet Commonly known as: REMERON Take 1 tablet (15 mg total) by mouth at bedtime.   omeprazole 20 MG capsule Commonly known as: PRILOSEC Take 20 mg by mouth every morning.   potassium chloride SA 20 MEQ tablet Commonly known as: KLOR-CON M Take 2 tablets (40 mEq total) by mouth 2 (two) times daily.   prazosin 2 MG capsule Commonly known as: MINIPRESS Take two tablets at  bedtime. What changed:  how much to take how to take this when to take this additional instructions   pregabalin 150 MG capsule Commonly known as: LYRICA TAKE ONE CAPSULE BY MOUTH THREE TIMES DAILY   repaglinide 2 MG tablet Commonly known as: PRANDIN Take 2 tablets (4 mg total) by mouth 2 (two) times daily before a meal.  spironolactone 25 MG tablet Commonly known as: ALDACTONE Take 0.5 tablets (12.5 mg total) by mouth daily.   temazepam 7.5 MG capsule Commonly known as: RESTORIL Take 1 capsule (7.5 mg total) by mouth at bedtime as needed for sleep.   tirzepatide 10 MG/0.5ML Pen Commonly known as: MOUNJARO Inject 10 mg into the skin once a week. Replaces: Mounjaro 5 MG/0.5ML Pen   torsemide 20 MG tablet Commonly known as: DEMADEX TAKE 4 TABLETS BY MOUTH EVERY MORNING and TAKE THREE TABLETS BY MOUTH EVERY EVENING What changed: See the new instructions.   Trelegy Ellipta 200-62.5-25 MCG/ACT Aepb Generic drug: Fluticasone-Umeclidin-Vilant Inhale 1 puff into the lungs daily. What changed: how much to take   Tresiba FlexTouch 100 UNIT/ML FlexTouch Pen Generic drug: insulin degludec Inject 40 Units into the skin daily.   Vascepa 1 g capsule Generic drug: icosapent Ethyl TAKE TWO CAPSULES BY MOUTH TWICE DAILY   venlafaxine XR 75 MG 24 hr capsule Commonly known as: EFFEXOR-XR Take 3 capsules (225 mg total) by mouth daily with breakfast.   VITAMIN D3 PO Take 2,000 Units by mouth daily.        Allergies:  Allergies  Allergen Reactions   Vancomycin Other (See Comments)    "Red Man Syndrome" 02/02/17: possible cause for rash under both arms   Niacin Other (See Comments)   Niacin And Related Other (See Comments)    Red man syndrome   Tubersol [Tuberculin, Ppd] Other (See Comments)    Reaction unknown   Doxycycline Rash and Other (See Comments)    Past Medical History:  Diagnosis Date   Acquired dilation of ascending aorta and aortic root (HCC)    6m by  echo 01/2021   Adenomatous colon polyp 2007   Anemia    Anxiety    Aortic stenosis    moderate AS by echo 01/2021   Asthma    BPH without obstruction/lower urinary tract symptoms 02/22/2017   Chronic diastolic (congestive) heart failure (HCC)    Chronic venous stasis 03/07/2019   COPD (chronic obstructive pulmonary disease) (HCC)    Coronary artery calcification seen on CAT scan    Depression    [place]11[place]   History of colon polyps 08/24/2018   Hypertension    Morbid obesity (HCC)    OSA (obstructive sleep apnea)    Pain due to onychomycosis of toenails of both feet 09/11/2019   Peripheral neuropathy 02/22/2017   Primary osteoarthritis, left shoulder 03/05/2017   PTSD (post-traumatic stress disorder)    Pure hypercholesterolemia    QT prolongation 03/07/2019   Seizures (HCC)    Shortness of breath    Sinus tachycardia 03/07/2019   Sleep apnea    CPAP   Type 2 diabetes mellitus with vascular disease [place] 09/11/[place]    Past Surgical History:  Procedure Laterality Date   ENDOVENOUS ABLATION SAPHENOUS VEIN W/ LASER Right 08/20/2020   endovenous laser ablation right greater saphenous vein by CGae GallopMD    JOINT REPLACEMENT     left knee replacement x 2   KNEE ARTHROSCOPY Bilateral    LEFT HEART CATH AND CORONARY ANGIOGRAPHY N/A 01/17/2018   Procedure: LEFT HEART CATH AND CORONARY ANGIOGRAPHY;  Surgeon: HLeonie Man MD;  [facility];  Service: Cardiovascular;  Laterality: N/A;   PRESSURE SENSOR/CARDIOMEMS N/A 08/26/2021   Procedure: PRESSURE SENSOR/CARDIOMEMS;  Surgeon: MLarey Dresser MD;  [facility];  Service: Cardiovascular;  Laterality: N/A;   RIGHT HEART CATH N/A  08/26/2021   Procedure: RIGHT HEART CATH;  Surgeon: Larey Dresser, MD;  Location: [facility];  Service: Cardiovascular;  Laterality: N/A;   TEE WITHOUT CARDIOVERSION N/A 08/26/2021   Procedure: TRANSESOPHAGEAL ECHOCARDIOGRAM (TEE);   Surgeon: Larey Dresser, MD;  Location: [facility];  Service: Cardiovascular;  Laterality: N/A;   UMBILICAL HERNIA REPAIR      Family History  Problem Relation Age of Onset   CAD Maternal Grandfather    Diabetes Other    Diabetes Mellitus II Neg Hx    Colon cancer Neg Hx    Esophageal cancer Neg Hx    Inflammatory bowel disease Neg Hx    Liver disease Neg Hx    Pancreatic cancer Neg Hx    Rectal cancer Neg Hx    Stomach cancer Neg Hx    Sleep apnea Neg Hx     Social History:  reports that he quit smoking about 6 years ago. His smoking use included cigarettes. He has a 44.00 pack-year smoking history. He has been exposed to tobacco smoke. He has never used smokeless tobacco. He reports current alcohol use. He reports that he does not use drugs.  Review of Systems:  Hypertension: Currently on metoprolol, diltiazem, spironolactone and prazosin  BP Readings from Last 3 Encounters:  07/07/22 110/76  05/27/22 (!) 147/87  03/31/22 106/70    Lipids: Managed by cardiology, recent LDL excellent on atorvastatin 40 mg    Lab Results  Component Value Date   CHOL 150 05/26/2022   CHOL 142 03/03/2022   CHOL 163 12/23/2021   Lab Results  Component Value Date   HDL 30 (L) 05/26/2022   HDL 30 (L) 03/03/2022   HDL 35 (L) 12/23/2021   Lab Results  Component Value Date   LDLCALC 80 05/26/2022   LDLCALC 60 03/03/2022   LDLCALC 62 12/23/2021   Lab Results  Component Value Date   TRIG 200 (H) 05/26/2022   TRIG 261 (H) 03/03/2022   TRIG 330 (H) 12/23/2021   Lab Results  Component Value Date   CHOLHDL 5.0 05/26/2022   CHOLHDL 4.7 03/03/2022   CHOLHDL 4.7 12/23/2021   No results found for: "LDLDIRECT"  Eye exams: Reportedly no retinopathy and up-to-date  He has numbness in his feet which is variable from neuropathy  He is being treated with CPAP for his OSA   Examination:   Ht '6\' 2"'$  (1.88 m)   Wt (!) 369 lb (167.4 kg)   BMI 47.38 kg/m   Body mass index is 47.38  kg/m.    ASSESSMENT/ PLAN:    Diabetes type 2 with severe obesity:   He is on a 4 drug regimen including Mounjaro, metformin and [place]. Insulin dose 44 units of Toujeo currently  Last A1c was 7.2 and improving  Blood glucose control is difficult to assess as he did not bring his CGM reader However quite likely he is still having some postprandial hyperglycemia  Overnight blood sugars reportedly may be low normal at times   Recommendations:  Reduce Toujeo to 40 for now and further if overnight blood sugars below 90 Increase Mounjaro to 10 mg and let us know if he does not tolerate this Continue [place]  Patient Instructions  Tresiba 40 units daily     Elayne Snare 07/25/2022, 4:37 PM

## 2022-07-25 NOTE — Patient Instructions (Signed)
Tresiba 40 units daily

## 2022-07-27 ENCOUNTER — Encounter (HOSPITAL_COMMUNITY): Payer: Self-pay | Admitting: Cardiology

## 2022-07-28 NOTE — Telephone Encounter (Signed)
  Dr Aundra Dubin See below Pts readings have been below goal for quite sometime despite increase in weight

## 2022-07-29 ENCOUNTER — Ambulatory Visit (INDEPENDENT_AMBULATORY_CARE_PROVIDER_SITE_OTHER): Payer: Medicare HMO | Admitting: Podiatry

## 2022-07-29 ENCOUNTER — Encounter: Payer: Self-pay | Admitting: Podiatry

## 2022-07-29 DIAGNOSIS — M79675 Pain in left toe(s): Secondary | ICD-10-CM | POA: Diagnosis not present

## 2022-07-29 DIAGNOSIS — M79674 Pain in right toe(s): Secondary | ICD-10-CM | POA: Diagnosis not present

## 2022-07-29 DIAGNOSIS — I878 Other specified disorders of veins: Secondary | ICD-10-CM

## 2022-07-29 DIAGNOSIS — B351 Tinea unguium: Secondary | ICD-10-CM | POA: Diagnosis not present

## 2022-07-29 DIAGNOSIS — E1142 Type 2 diabetes mellitus with diabetic polyneuropathy: Secondary | ICD-10-CM | POA: Diagnosis not present

## 2022-07-29 NOTE — Progress Notes (Signed)
This patient returns to my office for at risk foot care.  This patient requires this care by a professional since this patient will be at risk due to having diabetic neuropathy and angiopathy and venous stasis.  . This patient is unable to cut nails himself since the patient cannot reach his nails.These nails are painful walking and wearing shoes. This patient presents for at risk foot care today.  General Appearance  Alert, conversant and in no acute stress.  Vascular  Dorsalis pedis and posterior tibial  pulses are not  palpable  billaterally due to severe swelling feet.  Capillary return is within normal limits  bilaterally. Temperature is within normal limits  Bilaterally. Venous stasis right leg.  Neurologic  Senn-Weinstein monofilament wire test absent right foot.  Diminished left foot.. Muscle power within normal limits bilaterally.  Nails Thick disfigured discolored nails with subungual debris  from hallux to fifth toes bilaterally.  Pincer hallux nails   B/L.   Orthopedic  No limitations of motion  feet .  No crepitus or effusions noted.  No bony pathology or digital deformities noted.  Severe HAV  B/L.  Skin  normotropic skin with no porokeratosis noted bilaterally.  No signs of infections or ulcers noted.     Onychomycosis  Pain in right toes  Pain in left toes  Paronychia left hallux.  Consent was obtained for treatment procedures.   Mechanical debridement of nails 1-5  bilaterally performed with a nail nipper.  Filed with dremel without incident.     Return office visit   3 months                   Told patient to return for periodic foot care and evaluation due to potential at risk complications.   Gardiner Barefoot DPM

## 2022-08-05 ENCOUNTER — Other Ambulatory Visit: Payer: Self-pay

## 2022-08-05 ENCOUNTER — Other Ambulatory Visit: Payer: Self-pay | Admitting: Pulmonary Disease

## 2022-08-05 ENCOUNTER — Other Ambulatory Visit: Payer: Self-pay | Admitting: Cardiology

## 2022-08-05 ENCOUNTER — Other Ambulatory Visit (HOSPITAL_COMMUNITY): Payer: Self-pay

## 2022-08-05 DIAGNOSIS — J4540 Moderate persistent asthma, uncomplicated: Secondary | ICD-10-CM

## 2022-08-05 DIAGNOSIS — E782 Mixed hyperlipidemia: Secondary | ICD-10-CM

## 2022-08-05 DIAGNOSIS — E1165 Type 2 diabetes mellitus with hyperglycemia: Secondary | ICD-10-CM

## 2022-08-05 DIAGNOSIS — I480 Paroxysmal atrial fibrillation: Secondary | ICD-10-CM

## 2022-08-05 MED ORDER — DAPAGLIFLOZIN PROPANEDIOL 10 MG PO TABS: ORAL_TABLET | ORAL | 3 refills | Status: DC

## 2022-08-09 ENCOUNTER — Encounter: Payer: Self-pay | Admitting: Endocrinology

## 2022-08-09 ENCOUNTER — Encounter: Payer: Self-pay | Admitting: Cardiology

## 2022-08-11 ENCOUNTER — Other Ambulatory Visit: Payer: Self-pay | Admitting: Adult Health

## 2022-08-11 DIAGNOSIS — G4700 Insomnia, unspecified: Secondary | ICD-10-CM

## 2022-08-17 ENCOUNTER — Telehealth: Payer: Self-pay | Admitting: Cardiology

## 2022-08-17 NOTE — Telephone Encounter (Signed)
Pt c/o medication issue:  1. Name of Medication:  spironolactone (ALDACTONE) 25 MG tablet  2. How are you currently taking this medication (dosage and times per day)?   3. Are you having a reaction (difficulty breathing--STAT)?   4. What is your medication issue?   Cathy with [facility] states the medication was decreased to half a tablet during recent admission. She would like to clarify whether or not he needs to go back to taking a whole tablet.

## 2022-08-18 ENCOUNTER — Encounter: Payer: Self-pay | Admitting: Pulmonary Disease

## 2022-08-18 ENCOUNTER — Ambulatory Visit (INDEPENDENT_AMBULATORY_CARE_PROVIDER_SITE_OTHER): Payer: Medicare PPO | Admitting: Pulmonary Disease

## 2022-08-18 DIAGNOSIS — J4540 Moderate persistent asthma, uncomplicated: Secondary | ICD-10-CM | POA: Diagnosis not present

## 2022-08-18 MED ORDER — ALBUTEROL SULFATE HFA 108 (90 BASE) MCG/ACT IN AERS: 2.0000 | INHALATION_SPRAY | Freq: Four times a day (QID) | RESPIRATORY_TRACT | 6 refills | Status: DC | PRN

## 2022-08-18 MED ORDER — TRELEGY ELLIPTA 200-62.5-25 MCG/ACT IN AEPB: INHALATION_SPRAY | RESPIRATORY_TRACT | 11 refills | Status: DC

## 2022-08-18 NOTE — Progress Notes (Signed)
$'@Patient'E$  ID: Lawrence [name], male    DOB: Jan 14, 1956, 67 y.o.   MRN: [id number]  Chief Complaint  Patient presents with   Follow-up    Pt states trelegy has been working for him no c/o    Referring provider: [name], PA-C  HPI:   67 y.o. man whom we are seeing in follow up for evaluation of dyspnea on exertion.  Most recent cardiology note reviewed.    Doing well.  Continues on Trelegy.  Well-controlled symptoms.  Rare albuterol use, maybe twice in the last 6 months.  No complaints or issues.  Pleased with results of Trelegy.  HPI at initial visit: Patient reports several months more likely over a year history of dyspnea on exertion.  Suspect longer than this.  Worse on inclines or stairs.  Present on flat surfaces as well.  No time of day when things are better or worse.  No position that makes things better or worse.  No seasonal environmental factors he can identify to make things better or worse.  No alleviating or exacerbating factors other than rest.  Patient feels more short of breath when in atrial fibrillation.  Endorses worsening lower extremity swelling when he feels more short of breath.  Reviewed most recent chest x-ray 05/28/2021 demonstrates interstitial edema on my interpretation.  Reviewed most recent echocardiogram fall 2022 demonstrates diastolic dysfunction.  PMH: Diabetes, hypertension, atrial fibrillation, diastolic dysfunction Surgical history: Knee replacement, hernia repair Family history: Denies significant respiratory illness in first-degree relative Social history: Former smoker, 40+ pack year, quit 2017, lives in [place] / Pulmonary Flowsheets:   ACT:      No data to display          MMRC:     No data to display          Epworth:      No data to display          Tests:   FENO:  No results found for: "NITRICOXIDE"  PFT:     No data to display          WALK:      No data to display           Imaging: Personally reviewed and as per EMR and discussion this note was  Lab Results: Personally reviewed CBC    Component Value Date/Time   WBC 11.0 (H) 05/25/2022 2101   RBC 5.14 05/25/2022 2101   HGB 15.6 05/25/2022 2110   HCT 46.0 05/25/2022 2110   PLT 243 05/25/2022 2101   MCV 90.9 05/25/2022 2101   MCH 29.4 05/25/2022 2101   MCHC 32.3 05/25/2022 2101   RDW 15.1 05/25/2022 2101   LYMPHSABS 1.8 05/25/2022 2101   MONOABS 0.7 05/25/2022 2101   EOSABS 0.3 05/25/2022 2101   BASOSABS 0.0 05/25/2022 2101    BMET    Component Value Date/Time   NA 140 05/25/2022 2110   NA 136 01/07/2021 0000   K 4.0 05/25/2022 2110   CL 99 05/25/2022 2110   CO2 29 05/25/2022 2101   GLUCOSE 173 (H) 05/25/2022 2110   BUN 30 (H) 05/25/2022 2110   BUN 22 01/07/2021 0000   CREATININE 1.20 05/25/2022 2110   CALCIUM 9.1 05/25/2022 2101   GFRNONAA >60 05/25/2022 2101   GFRAA 77 05/20/2020 1038    BNP    Component Value Date/Time   BNP 18.6 03/09/2022 2123    ProBNP No results found for: "PROBNP"  Specialty Problems  Pulmonary Problems   OSA (obstructive sleep apnea)   Shortness of breath   COPD (chronic obstructive pulmonary disease) (HCC)    Allergies  Allergen Reactions   Vancomycin Other (See Comments)    "Red Man Syndrome" 02/02/17: possible cause for rash under both arms   Niacin Other (See Comments)   Niacin And Related Other (See Comments)    Red man syndrome   Tubersol [Tuberculin, Ppd] Other (See Comments)    Reaction unknown   Doxycycline Rash and Other (See Comments)    Immunization History  Administered Date(s) Administered   Influenza Split 04/13/2015, 05/23/2016   Influenza,inj,Quad PF,6+ Mos 08/16/2017, 06/22/2018, 04/04/2019   Influenza-Unspecified 06/28/2007, 06/22/2018, 05/09/2019   Moderna Sars-Covid-2 Vaccination 10/16/2018, 10/16/2019, 11/13/2019, 06/12/2020, 12/24/2020   Pfizer Covid-19 Vaccine Bivalent Booster 80yr & up 04/25/2021    Pneumococcal Polysaccharide-23 09/16/2015, 10/18/2020   Tdap 09/16/2015    Past Medical History:  Diagnosis Date   Acquired dilation of ascending aorta and aortic root ([facility]    448mby echo 01/2021   Adenomatous colon polyp 2007   Anemia    Anxiety    Aortic stenosis    moderate AS by echo 01/2021   Asthma    BPH without obstruction/lower urinary tract symptoms 02/22/2017   Chronic diastolic (congestive) heart failure (HCC)    Chronic venous stasis 03/07/2019   COPD (chronic obstructive pulmonary disease) (HCC)    Coronary artery calcification seen on CAT scan    Depression    Diabetic neuropathy ([facility]/10/2019   History of colon polyps 08/24/2018   Hypertension    Morbid obesity (HCC)    OSA (obstructive sleep apnea)    Pain due to onychomycosis of toenails of both feet 09/11/2019   Peripheral neuropathy 02/22/2017   Primary osteoarthritis, left shoulder 03/05/2017   PTSD (post-traumatic stress disorder)    Pure hypercholesterolemia    QT prolongation 03/07/2019   Seizures (HCC)    Shortness of breath    Sinus tachycardia 03/07/2019   Sleep apnea    CPAP   Type 2 diabetes mellitus with vascular disease ([place]02/10/2019    Tobacco History: Social History   Tobacco Use  Smoking Status Former   Packs/day: 1.00   Years: 44.00   Total pack years: 44.00   Types: Cigarettes   Quit date: 03/07/2016   Years since quitting: 6.4   Passive exposure: Past  Smokeless Tobacco Never   Counseling given: Not Answered   Continue to not smoke  Outpatient Encounter Medications as of 08/18/2022  Medication Sig   acetaminophen (TYLENOL) 325 MG tablet Take 2 tablets (650 mg total) by mouth every 4 (four) hours as needed for mild pain (or temp > 37.5 C (99.5 F)).   allopurinol (ZYLOPRIM) 100 MG tablet Take 200 mg by mouth daily.   ARIPiprazole (ABILIFY) 5 MG tablet TAKE ONE TABLET BY MOUTH EVERYDAY AT BEDTIME (Patient taking differently: Take 5 mg by mouth at bedtime.)    atorvastatin (LIPITOR) 40 MG tablet Take 1 tablet (40 mg total) by mouth every evening. TAKE ONE TABLET BY MOUTH DAILY AT 6PM. Please call [phone] schedule an appointment with Dr. TrFransico Himor future refills. 1st attempt.   busPIRone (BUSPAR) 10 MG tablet Take 1 tablet (10 mg total) by mouth 3 (three) times daily.   cetirizine (ZYRTEC) 10 MG tablet Take 10 mg by mouth at bedtime.    Cholecalciferol (VITAMIN D3 PO) Take 2,000 Units by mouth daily.   dapagliflozin propanediol (FARXIGA) 10 MG TABS  tablet TAKE ONE TABLET BY MOUTH BEFORE BREAKFAST   diltiazem (CARDIZEM CD) 120 MG 24 hr capsule Take 1 capsule (120 mg total) by mouth daily.   ELIQUIS 5 MG TABS tablet TAKE ONE TABLET BY MOUTH TWICE DAILY   ferrous sulfate 324 (65 Fe) MG TBEC Take 324 mg by mouth daily with breakfast.    fluticasone (FLONASE) 50 MCG/ACT nasal spray Place 2 sprays into both nostrils daily.    insulin degludec (TRESIBA FLEXTOUCH) 100 UNIT/ML FlexTouch Pen Inject 40 Units into the skin daily.   isosorbide mononitrate (IMDUR) 30 MG 24 hr tablet Take 1 tablet (30 mg total) by mouth every evening. Please call [phone] to schedule an appointment with Dr. Fransico Him for future refills. 1st attempt.   Lacosamide 150 MG TABS Take 1 tablet (150 mg total) by mouth 2 (two) times daily.   levETIRAcetam (KEPPRA) 500 MG tablet Take 1 tablet (500 mg total) by mouth 2 (two) times daily.   metFORMIN (GLUCOPHAGE-XR) 500 MG 24 hr tablet Take 4 tablets (2,000 mg total) by mouth daily. (Patient taking differently: Take 1,000 mg by mouth 2 (two) times daily with a meal.)   mirtazapine (REMERON) 15 MG tablet TAKE ONE TABLET BY MOUTH EVERYDAY AT BEDTIME   omeprazole (PRILOSEC) 20 MG capsule Take 20 mg by mouth every morning.   potassium chloride SA (KLOR-CON M) 20 MEQ tablet TAKE TWO TABLETS BY MOUTH TWICE DAILY   prazosin (MINIPRESS) 2 MG capsule Take two tablets at bedtime. (Patient taking differently: Take 4 mg by mouth at  bedtime.)   pregabalin (LYRICA) 150 MG capsule TAKE ONE CAPSULE BY MOUTH THREE TIMES DAILY   repaglinide (PRANDIN) 2 MG tablet Take 2 tablets (4 mg total) by mouth 2 (two) times daily before a meal.   spironolactone (ALDACTONE) 25 MG tablet Take 0.5 tablets (12.5 mg total) by mouth daily.   tirzepatide (MOUNJARO) 10 MG/0.5ML Pen Inject 10 mg into the skin once a week.   torsemide (DEMADEX) 20 MG tablet TAKE 4 TABLETS BY MOUTH EVERY MORNING and TAKE THREE TABLETS BY MOUTH EVERY EVENING (Patient taking differently: Take 60-80 mg by mouth See admin instructions. Take 4 tablets ('80mg'$ ) by mouth every morning and three tablets ('60mg'$ ) by mouth every evening.)   VASCEPA 1 g capsule TAKE TWO CAPSULES BY MOUTH TWICE DAILY   venlafaxine XR (EFFEXOR-XR) 75 MG 24 hr capsule Take 3 capsules (225 mg total) by mouth daily with breakfast.   [DISCONTINUED] albuterol (VENTOLIN HFA) 108 (90 Base) MCG/ACT inhaler Inhale 2 puffs into the lungs every 6 (six) hours as needed for wheezing or shortness of breath.   [DISCONTINUED] Fluticasone-Umeclidin-Vilant (TRELEGY ELLIPTA) 200-62.5-25 MCG/ACT AEPB INHALE 1 PUFF BY MOUTH INTO LUNGS ONCE DAILY   albuterol (VENTOLIN HFA) 108 (90 Base) MCG/ACT inhaler Inhale 2 puffs into the lungs every 6 (six) hours as needed for wheezing or shortness of breath.   Fluticasone-Umeclidin-Vilant (TRELEGY ELLIPTA) 200-62.5-25 MCG/ACT AEPB INHALE 1 PUFF BY MOUTH INTO LUNGS ONCE DAILY   [DISCONTINUED] temazepam (RESTORIL) 7.5 MG capsule Take 1 capsule (7.5 mg total) by mouth at bedtime as needed for sleep.   No facility-administered encounter medications on file as of 08/18/2022.     Review of Systems  Review of Systems  N/a Physical Exam  BP 102/72   Pulse 93   Temp 97.9 F (36.6 C) (Oral)   Ht '6\' 2"'$  (1.88 m)   Wt (!) 365 lb 6.4 oz (165.7 kg)   SpO2 94%   BMI 46.91 kg/m  Wt Readings from Last 5 Encounters:  08/18/22 (!) 365 lb 6.4 oz (165.7 kg)  07/25/22 (!) 369 lb (167.4 kg)   05/25/22 (!) 369 lb (167.4 kg)  03/31/22 (!) 341 lb (154.7 kg)  03/23/22 (!) 373 lb (169.2 kg)    BMI Readings from Last 5 Encounters:  08/18/22 46.91 kg/m  07/25/22 47.38 kg/m  07/07/22 47.38 kg/m  05/25/22 48.68 kg/m  03/31/22 43.78 kg/m     Physical Exam  General: Obese, sitting in a scooter Eyes: EOMI, icterus Neck: Supple, normal range of motion Pulmonary: Clear, normal work of breathing Cardiovascular: Irregular, no murmur Abdomen: Obese, nondistended MSK: No synovitis, no joint effusion Neuro: No weakness, sensation intact Psych: Normal mood, full affect with  Assessment & Plan:   Dyspnea on exertion/shortness of breath:   Likely multifactorial and largely related to fluid overload exacerbated atrial fibrillation given prior chest imaging.  Possible development of COPD given his history of cigarette smoking.  Likely contribution of habitus causing extraparenchymal restriction.  Also likely component of deconditioning.  He does endorse seasonal allergies so asthma is also possibility.   Stiolto prescribed 07/2021 with mild improvement in symptoms, escalated to Trelegy 10/2021 with further improvement in symptoms.  Asthma: Clinical diagnosis based on seasonal allergies, worsening breathing with pollens.  Improved Stiolto.  Escalated to Trelegy 10/2021 given worsening symptoms with pollens.  Further improvement in symptoms.  Trelegy and albuterol refilled today.  Obstructive sleep apnea: Managed by neurology.   Return in about 1 year (around 08/19/2023).   Lanier Clam, MD 08/18/2022

## 2022-08-18 NOTE — Telephone Encounter (Signed)
Per Dr. Aundra Dubin, patient to continue on spironolactone til he is seen on follow up. Patient saw pulmonologist today, still has spironolactone listed as current med. Called Cathy at [facility] and instructed that patient is to stay on medication.   Patient has not been seen at our office since 2022, recall notice sent out in 2023. Called and left message on patient's voice mail to call our office to schedule.

## 2022-08-18 NOTE — Patient Instructions (Addendum)
Nice to see you again  Continue Trelegy 1 puff daily  I sent a refill for Trelegy as well as her albuterol rescue inhaler  Return to clinic in 1 year or sooner if needed with Dr. Silas Flood

## 2022-08-24 ENCOUNTER — Encounter: Payer: Self-pay | Admitting: Endocrinology

## 2022-08-29 DIAGNOSIS — J449 Chronic obstructive pulmonary disease, unspecified: Secondary | ICD-10-CM | POA: Diagnosis not present

## 2022-08-29 DIAGNOSIS — G4733 Obstructive sleep apnea (adult) (pediatric): Secondary | ICD-10-CM | POA: Diagnosis not present

## 2022-08-29 DIAGNOSIS — R569 Unspecified convulsions: Secondary | ICD-10-CM | POA: Diagnosis not present

## 2022-09-01 DIAGNOSIS — E785 Hyperlipidemia, unspecified: Secondary | ICD-10-CM | POA: Diagnosis not present

## 2022-09-01 DIAGNOSIS — I10 Essential (primary) hypertension: Secondary | ICD-10-CM | POA: Diagnosis not present

## 2022-09-01 DIAGNOSIS — F331 Major depressive disorder, recurrent, moderate: Secondary | ICD-10-CM | POA: Diagnosis not present

## 2022-09-01 DIAGNOSIS — J449 Chronic obstructive pulmonary disease, unspecified: Secondary | ICD-10-CM | POA: Diagnosis not present

## 2022-09-01 DIAGNOSIS — I5032 Chronic diastolic (congestive) heart failure: Secondary | ICD-10-CM | POA: Diagnosis not present

## 2022-09-01 DIAGNOSIS — E1165 Type 2 diabetes mellitus with hyperglycemia: Secondary | ICD-10-CM | POA: Diagnosis not present

## 2022-09-01 DIAGNOSIS — E1142 Type 2 diabetes mellitus with diabetic polyneuropathy: Secondary | ICD-10-CM | POA: Diagnosis not present

## 2022-09-01 DIAGNOSIS — G4700 Insomnia, unspecified: Secondary | ICD-10-CM | POA: Diagnosis not present

## 2022-09-01 DIAGNOSIS — E1169 Type 2 diabetes mellitus with other specified complication: Secondary | ICD-10-CM | POA: Diagnosis not present

## 2022-09-02 ENCOUNTER — Other Ambulatory Visit: Payer: Self-pay | Admitting: Cardiology

## 2022-09-05 ENCOUNTER — Telehealth: Payer: Self-pay | Admitting: Cardiology

## 2022-09-05 ENCOUNTER — Other Ambulatory Visit: Payer: Self-pay | Admitting: Cardiology

## 2022-09-05 ENCOUNTER — Other Ambulatory Visit (HOSPITAL_COMMUNITY): Payer: Self-pay

## 2022-09-05 LAB — HEMOGLOBIN A1C: Hemoglobin A1C: 6.9

## 2022-09-05 MED ORDER — METOPROLOL SUCCINATE ER 25 MG PO TB24: 25.0000 mg | ORAL_TABLET | Freq: Every day | ORAL | 1 refills | Status: DC

## 2022-09-05 NOTE — Telephone Encounter (Signed)
*  STAT* If patient is at the pharmacy, call can be transferred to refill team.   1. Which medications need to be refilled? (please list name of each medication and dose if known) Metoprolol succinate XL 50 mg   2. Which pharmacy/location (including street and city if local pharmacy) is medication to be sent to? [facility] - [place], [place] - [place] [address]   3. Do they need a 30 day or 90 day supply? [place]

## 2022-09-06 ENCOUNTER — Ambulatory Visit: Payer: No Typology Code available for payment source | Admitting: Physician Assistant

## 2022-09-06 ENCOUNTER — Ambulatory Visit: Payer: Medicare PPO | Admitting: Nurse Practitioner

## 2022-09-07 ENCOUNTER — Other Ambulatory Visit (HOSPITAL_COMMUNITY): Payer: Self-pay

## 2022-09-07 NOTE — Telephone Encounter (Signed)
Error

## 2022-09-08 ENCOUNTER — Other Ambulatory Visit (HOSPITAL_COMMUNITY): Payer: Self-pay

## 2022-09-08 ENCOUNTER — Ambulatory Visit: Payer: Medicare HMO | Admitting: Adult Health

## 2022-09-08 DIAGNOSIS — R6889 Other general symptoms and signs: Secondary | ICD-10-CM | POA: Diagnosis not present

## 2022-09-08 DIAGNOSIS — E1165 Type 2 diabetes mellitus with hyperglycemia: Secondary | ICD-10-CM | POA: Diagnosis not present

## 2022-09-08 MED ORDER — METOPROLOL SUCCINATE ER 25 MG PO TB24: 25.0000 mg | ORAL_TABLET | Freq: Every day | ORAL | 1 refills | Status: DC

## 2022-09-08 MED ORDER — DILTIAZEM HCL ER COATED BEADS 120 MG PO CP24: 120.0000 mg | ORAL_CAPSULE | Freq: Every day | ORAL | 0 refills | Status: DC

## 2022-09-08 NOTE — Telephone Encounter (Signed)
Patient left voicemail asking for a call. Spoke with patient who requested a refill on his metoprolol. He is tolerating it well with BP usually ~120/80 and heart rate 70-80. Refill sent to [facility]..

## 2022-09-12 ENCOUNTER — Other Ambulatory Visit (HOSPITAL_COMMUNITY): Payer: Self-pay | Admitting: Cardiology

## 2022-09-13 ENCOUNTER — Other Ambulatory Visit: Payer: Self-pay | Admitting: Cardiology

## 2022-09-13 DIAGNOSIS — H5213 Myopia, bilateral: Secondary | ICD-10-CM | POA: Diagnosis not present

## 2022-09-13 DIAGNOSIS — R6889 Other general symptoms and signs: Secondary | ICD-10-CM | POA: Diagnosis not present

## 2022-09-14 ENCOUNTER — Encounter: Payer: Self-pay | Admitting: Endocrinology

## 2022-09-14 NOTE — Telephone Encounter (Signed)
*  STAT* If patient is at the pharmacy, call can be transferred to refill team.   1. Which medications need to be refilled? (please list name of each medication and dose if known) Atorvastatin and Isosorbide  2. Which pharmacy/location (including street and city if local pharmacy) is medication to be sent to?[facility]  3. Do they need a 30 day or 90 day supply? 30 days and refills

## 2022-09-15 ENCOUNTER — Other Ambulatory Visit: Payer: Self-pay | Admitting: Neurology

## 2022-09-15 DIAGNOSIS — R6889 Other general symptoms and signs: Secondary | ICD-10-CM | POA: Diagnosis not present

## 2022-09-19 ENCOUNTER — Other Ambulatory Visit: Payer: Self-pay | Admitting: Cardiology

## 2022-09-22 ENCOUNTER — Ambulatory Visit: Payer: Medicare HMO | Admitting: Adult Health

## 2022-09-22 DIAGNOSIS — R6889 Other general symptoms and signs: Secondary | ICD-10-CM | POA: Diagnosis not present

## 2022-09-29 ENCOUNTER — Other Ambulatory Visit (HOSPITAL_COMMUNITY): Payer: Self-pay | Admitting: Cardiology

## 2022-09-29 DIAGNOSIS — R6889 Other general symptoms and signs: Secondary | ICD-10-CM | POA: Diagnosis not present

## 2022-10-03 ENCOUNTER — Other Ambulatory Visit: Payer: Self-pay

## 2022-10-03 DIAGNOSIS — E1165 Type 2 diabetes mellitus with hyperglycemia: Secondary | ICD-10-CM

## 2022-10-03 MED ORDER — REPAGLINIDE 2 MG PO TABS: 4.0000 mg | ORAL_TABLET | Freq: Two times a day (BID) | ORAL | 3 refills | Status: DC

## 2022-10-04 ENCOUNTER — Other Ambulatory Visit: Payer: Self-pay | Admitting: Cardiology

## 2022-10-04 ENCOUNTER — Other Ambulatory Visit: Payer: Self-pay | Admitting: Endocrinology

## 2022-10-04 ENCOUNTER — Other Ambulatory Visit (HOSPITAL_COMMUNITY): Payer: Self-pay | Admitting: Cardiology

## 2022-10-04 DIAGNOSIS — E1165 Type 2 diabetes mellitus with hyperglycemia: Secondary | ICD-10-CM

## 2022-10-04 NOTE — Telephone Encounter (Signed)
This is a CHF pt 

## 2022-10-05 ENCOUNTER — Other Ambulatory Visit: Payer: Self-pay | Admitting: Adult Health

## 2022-10-05 DIAGNOSIS — I10 Essential (primary) hypertension: Secondary | ICD-10-CM | POA: Diagnosis not present

## 2022-10-05 DIAGNOSIS — J449 Chronic obstructive pulmonary disease, unspecified: Secondary | ICD-10-CM | POA: Diagnosis not present

## 2022-10-05 DIAGNOSIS — G4700 Insomnia, unspecified: Secondary | ICD-10-CM | POA: Diagnosis not present

## 2022-10-05 DIAGNOSIS — R6889 Other general symptoms and signs: Secondary | ICD-10-CM | POA: Diagnosis not present

## 2022-10-05 DIAGNOSIS — M1990 Unspecified osteoarthritis, unspecified site: Secondary | ICD-10-CM | POA: Diagnosis not present

## 2022-10-05 DIAGNOSIS — E1165 Type 2 diabetes mellitus with hyperglycemia: Secondary | ICD-10-CM | POA: Diagnosis not present

## 2022-10-05 DIAGNOSIS — E785 Hyperlipidemia, unspecified: Secondary | ICD-10-CM | POA: Diagnosis not present

## 2022-10-05 DIAGNOSIS — I480 Paroxysmal atrial fibrillation: Secondary | ICD-10-CM | POA: Diagnosis not present

## 2022-10-06 ENCOUNTER — Ambulatory Visit: Payer: Medicare HMO | Admitting: Neurology

## 2022-10-06 DIAGNOSIS — Z9181 History of falling: Secondary | ICD-10-CM | POA: Diagnosis not present

## 2022-10-06 DIAGNOSIS — Z6841 Body Mass Index (BMI) 40.0 and over, adult: Secondary | ICD-10-CM | POA: Diagnosis not present

## 2022-10-06 DIAGNOSIS — Z1389 Encounter for screening for other disorder: Secondary | ICD-10-CM | POA: Diagnosis not present

## 2022-10-06 DIAGNOSIS — Z0000 Encounter for general adult medical examination without abnormal findings: Secondary | ICD-10-CM | POA: Diagnosis not present

## 2022-10-06 DIAGNOSIS — R6889 Other general symptoms and signs: Secondary | ICD-10-CM | POA: Diagnosis not present

## 2022-10-07 ENCOUNTER — Other Ambulatory Visit: Payer: Self-pay | Admitting: *Deleted

## 2022-10-07 ENCOUNTER — Telehealth: Payer: Self-pay | Admitting: Cardiology

## 2022-10-07 ENCOUNTER — Ambulatory Visit (INDEPENDENT_AMBULATORY_CARE_PROVIDER_SITE_OTHER): Payer: Medicare HMO | Admitting: Adult Health

## 2022-10-07 ENCOUNTER — Encounter: Payer: Self-pay | Admitting: Adult Health

## 2022-10-07 DIAGNOSIS — I872 Venous insufficiency (chronic) (peripheral): Secondary | ICD-10-CM

## 2022-10-07 DIAGNOSIS — F411 Generalized anxiety disorder: Secondary | ICD-10-CM

## 2022-10-07 DIAGNOSIS — F331 Major depressive disorder, recurrent, moderate: Secondary | ICD-10-CM

## 2022-10-07 DIAGNOSIS — F410 Panic disorder [episodic paroxysmal anxiety] without agoraphobia: Secondary | ICD-10-CM

## 2022-10-07 DIAGNOSIS — L97219 Non-pressure chronic ulcer of right calf with unspecified severity: Secondary | ICD-10-CM

## 2022-10-07 DIAGNOSIS — G4700 Insomnia, unspecified: Secondary | ICD-10-CM

## 2022-10-07 DIAGNOSIS — F4310 Post-traumatic stress disorder, unspecified: Secondary | ICD-10-CM | POA: Diagnosis not present

## 2022-10-07 DIAGNOSIS — R6889 Other general symptoms and signs: Secondary | ICD-10-CM | POA: Diagnosis not present

## 2022-10-07 DIAGNOSIS — I83012 Varicose veins of right lower extremity with ulcer of calf: Secondary | ICD-10-CM

## 2022-10-07 NOTE — Telephone Encounter (Signed)
Calling to discuss patient medication. Please advise

## 2022-10-07 NOTE — Telephone Encounter (Signed)
Returned Kristen's call, left message with direct phone number for her to call back.

## 2022-10-07 NOTE — Progress Notes (Signed)
ARTEZ RIGONI [id number] Oct 24, 1955 67 y.o.  Subjective:   Patient ID:  Lawrence [name] is a 67 y.o. (DOB 18-Nov-1955) male.  Chief Complaint: No chief complaint on file.   HPI Lawrence [name] presents to the office today for follow-up of panic attacks, MDD, GAD, PTSD, and insomnia.  Previously followed by [place].  Describes mood today as "ok". Pleasant. Tearful sometimes. Mood symptoms - reports some depression - "still".  Feels anxious at times. Denies irritability. Reports some worry, rumination, and over thinking. Reports panic attacks. Mood is lower.  Stating "I'm doing ok for the most part". Feels like current medications are helpful. He and partner doing well. Stable interest and motivation. Taking medications as prescribed.  Energy levels varies. Active, does not have a regular exercise routine.   Enjoys some usual interests and activities. Lives with significant other. No family local. Has a good friend locally. Appetite fair. Weight loss 363 pounds. Sleeps 4 to 6 hours a night. Reports daytime napping.   Focus and concentration difficulties. Completing tasks. Managing aspects of household. Retired [name]. Disabled. Denies SI or HI.  Denies AH or VH. Denies self harm. Denies substance use.  Previous medication trials: Ambien    Flowsheet Row ED to Hosp-Admission (Discharged) from 05/25/2022 in [place] ED from 03/09/2022 in [facility] Emergency Department at [facility] ED to Hosp-Admission (Discharged) from 02/24/2022 in [facility]  C-SSRS RISK CATEGORY No Risk No Risk No Risk        Review of Systems:  Review of Systems  Musculoskeletal:  Negative for gait problem.  Neurological:  Negative for tremors.  Psychiatric/Behavioral:         Please refer to HPI    Medications: I have reviewed the patient's current medications.  Current Outpatient Medications  Medication Sig Dispense Refill   acetaminophen  (TYLENOL) 325 MG tablet Take 2 tablets (650 mg total) by mouth every 4 (four) hours as needed for mild pain (or temp > 37.5 C (99.5 F)).     albuterol (VENTOLIN HFA) 108 (90 Base) MCG/ACT inhaler Inhale 2 puffs into the lungs every 6 (six) hours as needed for wheezing or shortness of breath. 1 each 6   allopurinol (ZYLOPRIM) 100 MG tablet Take 200 mg by mouth daily.     ARIPiprazole (ABILIFY) 5 MG tablet TAKE ONE TABLET BY MOUTH EVERYDAY AT BEDTIME (Patient taking differently: Take 5 mg by mouth at bedtime.) 90 tablet 3   atorvastatin (LIPITOR) 40 MG tablet Take 1 tablet (40 mg total) by mouth daily. Please keep upcoming appointment in March for future refills, thank you. 30 tablet 0   busPIRone (BUSPAR) 10 MG tablet Take 1 tablet (10 mg total) by mouth 3 (three) times daily. 270 tablet 3   cetirizine (ZYRTEC) 10 MG tablet Take 10 mg by mouth at bedtime.      Cholecalciferol (VITAMIN D3 PO) Take 2,000 Units by mouth daily.     dapagliflozin propanediol (FARXIGA) 10 MG TABS tablet TAKE ONE TABLET BY MOUTH BEFORE BREAKFAST 90 tablet 3   diltiazem (CARDIZEM CD) 120 MG 24 hr capsule TAKE ONE CAPSULE BY MOUTH ONCE DAILY 30 capsule 2   ELIQUIS 5 MG TABS tablet TAKE ONE TABLET BY MOUTH TWICE DAILY 180 tablet 2   ferrous sulfate 324 (65 Fe) MG TBEC Take 324 mg by mouth daily with breakfast.      fluticasone (FLONASE) 50 MCG/ACT nasal spray Place 2 sprays into both nostrils  daily.      Fluticasone-Umeclidin-Vilant (TRELEGY ELLIPTA) 200-62.5-25 MCG/ACT AEPB INHALE 1 PUFF BY MOUTH INTO LUNGS ONCE DAILY 60 each 11   insulin degludec (TRESIBA FLEXTOUCH) 100 UNIT/ML FlexTouch Pen Inject 40 Units into the skin daily. 15 mL 3   isosorbide mononitrate (IMDUR) 30 MG 24 hr tablet TAKE ONE TABLET BY MOUTH EVERY EVENING Needs appointment for further refills 15 tablet 0   Lacosamide 150 MG TABS Take 1 tablet (150 mg total) by mouth 2 (two) times daily. 180 tablet 0   levETIRAcetam (KEPPRA) 500 MG tablet TAKE ONE TABLET  BY MOUTH TWICE DAILY 180 tablet 1   metFORMIN (GLUCOPHAGE-XR) 500 MG 24 hr tablet Take 4 tablets (2,000 mg total) by mouth daily. (Patient taking differently: Take 1,000 mg by mouth 2 (two) times daily with a meal.) 360 tablet 3   metoprolol succinate (TOPROL-XL) 25 MG 24 hr tablet Take 1 tablet (25 mg total) by mouth daily. 90 tablet 1   mirtazapine (REMERON) 15 MG tablet TAKE ONE TABLET BY MOUTH EVERYDAY AT BEDTIME 30 tablet 2   omeprazole (PRILOSEC) 20 MG capsule Take 20 mg by mouth every morning.     potassium chloride SA (KLOR-CON M) 20 MEQ tablet TAKE TWO TABLETS BY MOUTH TWICE DAILY 270 tablet 3   prazosin (MINIPRESS) 2 MG capsule Take two tablets at bedtime. (Patient taking differently: Take 4 mg by mouth at bedtime.) 180 capsule 3   pregabalin (LYRICA) 150 MG capsule TAKE ONE CAPSULE BY MOUTH THREE TIMES DAILY 270 capsule 0   repaglinide (PRANDIN) 2 MG tablet TAKE TWO TABLETS BY MOUTH TWICE DAILY BEFORE A meal 120 tablet 0   spironolactone (ALDACTONE) 25 MG tablet Take 0.5 tablets (12.5 mg total) by mouth daily. 15 tablet 2   tirzepatide (MOUNJARO) 10 MG/0.5ML Pen Inject 10 mg into the skin once a week. 6 mL 1   torsemide (DEMADEX) 20 MG tablet TAKE 4 TABLETS BY MOUTH EVERY MORNING and TAKE THREE TABLETS BY MOUTH EVERY EVENING (Patient taking differently: Take 60-80 mg by mouth See admin instructions. Take 4 tablets ('80mg'$ ) by mouth every morning and three tablets ('60mg'$ ) by mouth every evening.) 450 tablet 3   VASCEPA 1 g capsule TAKE TWO CAPSULES BY MOUTH TWICE DAILY 120 capsule 5   venlafaxine XR (EFFEXOR-XR) 75 MG 24 hr capsule Take 3 capsules (225 mg total) by mouth daily with breakfast. 270 capsule 3   No current facility-administered medications for this visit.    Medication Side Effects: None  Allergies:  Allergies  Allergen Reactions   Vancomycin Other (See Comments)    "Red Man Syndrome" 02/02/17: possible cause for rash under both arms   Niacin Other (See Comments)    Niacin And Related Other (See Comments)    Red man syndrome   Tubersol [Tuberculin, Ppd] Other (See Comments)    Reaction unknown   Doxycycline Rash and Other (See Comments)    Past Medical History:  Diagnosis Date   Acquired dilation of ascending aorta and aortic root (HCC)    21m by echo 01/2021   Adenomatous colon polyp 2007   Anemia    Anxiety    Aortic stenosis    moderate AS by echo 01/2021   Asthma    BPH without obstruction/lower urinary tract symptoms 02/22/2017   Chronic diastolic (congestive) heart failure (HCC)    Chronic venous stasis 03/07/2019   COPD (chronic obstructive pulmonary disease) (HCC)    Coronary artery calcification seen on CAT scan  Depression    Diabetic neuropathy ([place]) 09/11/2019   History of colon polyps 08/24/2018   Hypertension    Morbid obesity (HCC)    OSA (obstructive sleep apnea)    Pain due to onychomycosis of toenails of both feet 09/11/2019   Peripheral neuropathy 02/22/2017   Primary osteoarthritis, left shoulder 03/05/2017   PTSD (post-traumatic stress disorder)    Pure hypercholesterolemia    QT prolongation 03/07/2019   Seizures (HCC)    Shortness of breath    Sinus tachycardia 03/07/2019   Sleep apnea    CPAP   Type 2 diabetes mellitus with vascular disease ([place]) 09/11/2019    Past Medical History, Surgical history, Social history, and Family history were reviewed and updated as appropriate.   Please see review of systems for further details on the patient's review from today.   Objective:   Physical Exam:  There were no vitals taken for this visit.  Physical Exam Constitutional:      General: He is not in acute distress. Musculoskeletal:        General: No deformity.  Neurological:     Mental Status: He is alert and oriented to person, place, and time.     Coordination: Coordination normal.  Psychiatric:        Attention and Perception: Attention and perception normal. He does not perceive auditory or visual  hallucinations.        Mood and Affect: Mood normal. Mood is not anxious or depressed. Affect is not labile, blunt, angry or inappropriate.        Speech: Speech normal.        Behavior: Behavior normal.        Thought Content: Thought content normal. Thought content is not paranoid or delusional. Thought content does not include homicidal or suicidal ideation. Thought content does not include homicidal or suicidal plan.        Cognition and Memory: Cognition and memory normal.        Judgment: Judgment normal.     Comments: Insight intact     Lab Review:     Component Value Date/Time   NA 140 05/25/2022 2110   NA 136 01/07/2021 0000   K 4.0 05/25/2022 2110   CL 99 05/25/2022 2110   CO2 29 05/25/2022 2101   GLUCOSE 173 (H) 05/25/2022 2110   BUN 30 (H) 05/25/2022 2110   BUN 22 01/07/2021 0000   CREATININE 1.20 05/25/2022 2110   CALCIUM 9.1 05/25/2022 2101   PROT 6.9 05/25/2022 2101   ALBUMIN 3.7 05/25/2022 2101   AST 14 (L) 05/25/2022 2101   ALT 18 05/25/2022 2101   ALKPHOS 78 05/25/2022 2101   BILITOT 0.3 05/25/2022 2101   GFRNONAA >60 05/25/2022 2101   GFRAA 77 05/20/2020 1038       Component Value Date/Time   WBC 11.0 (H) 05/25/2022 2101   RBC 5.14 05/25/2022 2101   HGB 15.6 05/25/2022 2110   HCT 46.0 05/25/2022 2110   PLT 243 05/25/2022 2101   MCV 90.9 05/25/2022 2101   MCH 29.4 05/25/2022 2101   MCHC 32.3 05/25/2022 2101   RDW 15.1 05/25/2022 2101   LYMPHSABS 1.8 05/25/2022 2101   MONOABS 0.7 05/25/2022 2101   EOSABS 0.3 05/25/2022 2101   BASOSABS 0.0 05/25/2022 2101    No results found for: "POCLITH", "LITHIUM"   No results found for: "PHENYTOIN", "PHENOBARB", "VALPROATE", "CBMZ"   .res Assessment: Plan:    Plan:  PDMP reviewed  Prazosin '2mg'$  - 2 tabs at  hs Temazepam 7.'5mg'$  at bedtime Abilify '5mg'$  daily Effexor XR '75mg'$  - 3 every morning Buspar '5mg'$  TID   [place] - pain management - spinal stimulator  RTC 3 months  Patient  advised to contact office with any questions, adverse effects, or acute worsening in signs and symptoms.  Discussed potential metabolic side effects associated with atypical antipsychotics, as well as potential risk for movement side effects. Advised pt to contact office if movement side effects occur.   Diagnoses and all orders for this visit:  Major depressive disorder, recurrent episode, moderate (HCC)  Generalized anxiety disorder  PTSD (post-traumatic stress disorder)  Panic attacks  Insomnia, unspecified type     Please see After Visit Summary for patient specific instructions.  Future Appointments  Date Time Provider [place]  10/11/2022  2:45 PM Alric Ran, MD [facility] None  10/12/2022  1:30 PM Elgie Collard, PA-C [facility]  10/19/2022  3:00 PM [facility] 4 - SS [facility]  10/19/2022  3:40 PM Angelia Mould, MD [facility]  10/20/2022 11:15 AM Elayne Snare, MD [facility] None  11/11/2022  7:45 AM Gardiner Barefoot, DPM [facility]  12/01/2022  9:30 AM Ubaldo Glassing, Amy, NP [facility] None  01/05/2023  9:40 AM Christen Bedoya, Berdie Ogren, NP [facility] None    No orders of the defined types were placed in this encounter.   -------------------------------

## 2022-10-08 DIAGNOSIS — H524 Presbyopia: Secondary | ICD-10-CM | POA: Diagnosis not present

## 2022-10-09 ENCOUNTER — Other Ambulatory Visit: Payer: Self-pay | Admitting: Endocrinology

## 2022-10-11 ENCOUNTER — Encounter: Payer: Self-pay | Admitting: Neurology

## 2022-10-11 ENCOUNTER — Ambulatory Visit (INDEPENDENT_AMBULATORY_CARE_PROVIDER_SITE_OTHER): Payer: Medicare HMO | Admitting: Neurology

## 2022-10-11 VITALS — BP 133/81 | HR 83

## 2022-10-11 DIAGNOSIS — G4733 Obstructive sleep apnea (adult) (pediatric): Secondary | ICD-10-CM | POA: Diagnosis not present

## 2022-10-11 DIAGNOSIS — E1142 Type 2 diabetes mellitus with diabetic polyneuropathy: Secondary | ICD-10-CM | POA: Diagnosis not present

## 2022-10-11 DIAGNOSIS — G40909 Epilepsy, unspecified, not intractable, without status epilepticus: Secondary | ICD-10-CM

## 2022-10-11 DIAGNOSIS — R6889 Other general symptoms and signs: Secondary | ICD-10-CM | POA: Diagnosis not present

## 2022-10-11 MED ORDER — LACOSAMIDE 150 MG PO TABS: 150.0000 mg | ORAL_TABLET | Freq: Two times a day (BID) | ORAL | 3 refills | Status: DC

## 2022-10-11 MED ORDER — LEVETIRACETAM 500 MG PO TABS: 500.0000 mg | ORAL_TABLET | Freq: Two times a day (BID) | ORAL | 3 refills | Status: DC

## 2022-10-11 MED ORDER — PREGABALIN 150 MG PO CAPS: 150.0000 mg | ORAL_CAPSULE | Freq: Three times a day (TID) | ORAL | 3 refills | Status: DC

## 2022-10-11 NOTE — Progress Notes (Signed)
Office Visit    Patient Name: Lawrence [name] Date of Encounter: 10/12/2022  PCP:  [name], [place]  Cardiologist:  Fransico Him, MD  Advanced Practice Provider:  No care team member to display Electrophysiologist:  [name], MD   {   HPI    Lawrence [name] is a 67 y.o. male with a past medical history of paroxysmal atrial fibrillation, OSA, morbid obesity, and chronic diastolic CHF presents today for follow-up visit.  Seen by the outpatient heart failure clinic August 2023.  Patient had been admitted multiple times in the last year for CHF exacerbations.  Uses a walker to get around the house and a motorized scooter outside of the house.  Negative cath 2019 and negative Cardiolite 4/22.  Most recent echo 11/22 was a difficult study but showed EF 60 to 65%, mild LVH, mildly dilated LV, moderate aortic stenosis (trileaflet aortic valve) with mean gradient 22 mmHg.  He was admitted 12/22 with fever/PNA as well as chest pain (ruled out for MI).  Had atrial fibrillation/RVR but converted back to normal sinus rhythm.  Diuresed.  Additionally, thought to have a partial seizure and Keppra was restarted.  Cardiomems was placed 1/23.  Admitted 12/15/2021 with left facial droop.  CT of the head negative.  Neurology felt this was secondary to breakthrough seizures.  Continue on Keppra.  When he was last seen August 2023 he was overall feeling fine.  He is motorized scooter outside the home.  Limited by back pain.  No bleeding issues.  Using BiPAP nightly.  Appetite okay.  No fever or chills.  Weight at home was 341 pounds.  Taking all medications.  Today, he tells me that he recently had a nerve stimulator implanted.  He has had no issues with his medications other than his potassium tabs which are hard to swallow.  He remains on an aggressive diuretic regimen with Demadex 80 mg in the morning and 60 mg in the evening as well as Farxiga 10 mg  daily and spironolactone 12.5 mg daily.  He states he urinates all the time.  He does not feel like his weight has increased at all.  Small amount of lower extremity edema today.  He does have venous stasis and he follows up with Dr. Doren Custard next week.  He does have pain in his legs but does not feel like they are swollen today.  He is maintaining a low-sodium diet.  We discussed his triglycerides which remain elevated at 200.  He is on Vascepa already.  We discussed adding fenofibrate today and repeating a lipid panel.   Reports no shortness of breath nor dyspnea on exertion. Reports no chest pain, pressure, or tightness. No edema, orthopnea, PND. Reports no palpitations.   Past Medical History    Past Medical History:  Diagnosis Date   Acquired dilation of ascending aorta and aortic root ([place])    18m by echo 01/2021   Adenomatous colon polyp 2007   Anemia    Anxiety    Aortic stenosis    moderate AS by echo 01/2021   Asthma    BPH without obstruction/lower urinary tract symptoms 02/22/2017   Chronic diastolic (congestive) heart failure (HCC)    Chronic venous stasis 03/07/2019   COPD (chronic obstructive pulmonary disease) (HCC)    Coronary artery calcification seen on CAT scan    Depression    Diabetic neuropathy [place] 09/11/2019   History of colon polyps  08/24/2018   Hypertension    Morbid obesity (HCC)    OSA (obstructive sleep apnea)    Pain due to onychomycosis of toenails of both feet 09/11/2019   Peripheral neuropathy 02/22/2017   Primary osteoarthritis, left shoulder 03/05/2017   PTSD (post-traumatic stress disorder)    Pure hypercholesterolemia    QT prolongation 03/07/2019   Seizures (HCC)    Shortness of breath    Sinus tachycardia 03/07/2019   Sleep apnea    CPAP   Type 2 diabetes mellitus with vascular disease ([place]) 09/11/2019   Past Surgical History:  Procedure Laterality Date   ENDOVENOUS ABLATION SAPHENOUS VEIN W/ LASER Right 08/20/2020   endovenous laser  ablation right greater saphenous vein by Gae Gallop MD    JOINT REPLACEMENT     left knee replacement x 2   KNEE ARTHROSCOPY Bilateral    LEFT HEART CATH AND CORONARY ANGIOGRAPHY N/A 01/17/2018   Procedure: LEFT HEART CATH AND CORONARY ANGIOGRAPHY;  Surgeon: Leonie Man, MD;  Location: [facility];  Service: Cardiovascular;  Laterality: N/A;   PRESSURE SENSOR/CARDIOMEMS N/A 08/26/2021   Procedure: PRESSURE SENSOR/CARDIOMEMS;  Surgeon: Larey Dresser, MD;  Location: [facility];  Service: Cardiovascular;  Laterality: N/A;   RIGHT HEART CATH N/A 08/26/2021   Procedure: RIGHT HEART CATH;  Surgeon: Larey Dresser, MD;  Location: [facility];  Service: Cardiovascular;  Laterality: N/A;   TEE WITHOUT CARDIOVERSION N/A 08/26/2021   Procedure: TRANSESOPHAGEAL ECHOCARDIOGRAM (TEE);  Surgeon: Larey Dresser, MD;  Location: [facility];  Service: Cardiovascular;  Laterality: N/A;   UMBILICAL HERNIA REPAIR      Allergies  Allergies  Allergen Reactions   Vancomycin Other (See Comments)    "Red Man Syndrome" 02/02/17: possible cause for rash under both arms   Niacin Other (See Comments)   Niacin And Related Other (See Comments)    Red man syndrome   Tubersol [Tuberculin, Ppd] Other (See Comments)    Reaction unknown   Doxycycline Rash and Other (See Comments)    EKGs/Labs/Other Studies Reviewed:   The following studies were reviewed today:  Echocardiogram 05/26/2022  IMPRESSIONS     1. Left ventricular ejection fraction, by estimation, is 60 to 65%. The  left ventricle has normal function. The left ventricle has no regional  wall motion abnormalities. The left ventricular internal cavity size was  mildly dilated. Left ventricular  diastolic parameters are consistent with Grade I diastolic dysfunction  (impaired relaxation).   2. Right ventricular systolic function is normal. The right ventricular  size is normal.   3. Left atrial size was severely  dilated.   4. The mitral valve is normal in structure. No evidence of mitral valve  regurgitation. No evidence of mitral stenosis.   5. The aortic valve is calcified. Aortic valve regurgitation is not  visualized. Moderate aortic valve stenosis.   6. The inferior vena cava is dilated in size with >50% respiratory  variability, suggesting right atrial pressure of 8 mmHg.   FINDINGS   Left Ventricle: Left ventricular ejection fraction, by estimation, is 60  to 65%. The left ventricle has normal function. The left ventricle has no  regional wall motion abnormalities. The left ventricular internal cavity  size was mildly dilated. There is   no left ventricular hypertrophy. Left ventricular diastolic parameters  are consistent with Grade I diastolic dysfunction (impaired relaxation).   Right Ventricle: The right ventricular size is normal. Right ventricular  systolic function is normal.  Left Atrium: Left atrial size was severely dilated.   Right Atrium: Right atrial size was normal in size.   Pericardium: There is no evidence of pericardial effusion.   Mitral Valve: The mitral valve is normal in structure. Mild mitral annular  calcification. No evidence of mitral valve regurgitation. No evidence of  mitral valve stenosis.   Tricuspid Valve: The tricuspid valve is normal in structure. Tricuspid  valve regurgitation is not demonstrated. No evidence of tricuspid  stenosis.   Aortic Valve: The aortic valve is calcified. Aortic valve regurgitation is  not visualized. Moderate aortic stenosis is present. Aortic valve mean  gradient measures 28.0 mmHg. Aortic valve peak gradient measures 45.2  mmHg. Aortic valve area, by VTI  measures 0.99 cm.   Pulmonic Valve: The pulmonic valve was not well visualized. Pulmonic valve  regurgitation is not visualized. No evidence of pulmonic stenosis.   Aorta: The aortic root is normal in size and structure.   Venous: The inferior vena cava is  dilated in size with greater than 50%  respiratory variability, suggesting right atrial pressure of 8 mmHg.   IAS/Shunts: No atrial level shunt detected by color flow Doppler.   EKG:  EKG is not ordered today.    Recent Labs: 03/09/2022: B Natriuretic Peptide 18.6 05/25/2022: ALT 18; BUN 30; Creatinine, Ser 1.20; Hemoglobin 15.6; Platelets 243; Potassium 4.0; Sodium 140 05/26/2022: TSH 1.182  Recent Lipid Panel    Component Value Date/Time   CHOL 150 05/26/2022 0205   TRIG 200 (H) 05/26/2022 0205   HDL 30 (L) 05/26/2022 0205   CHOLHDL 5.0 05/26/2022 0205   VLDL 40 05/26/2022 0205   LDLCALC 80 05/26/2022 0205    Home Medications   Current Meds  Medication Sig   acetaminophen (TYLENOL) 325 MG tablet Take 2 tablets (650 mg total) by mouth every 4 (four) hours as needed for mild pain (or temp > 37.5 C (99.5 F)).   albuterol (VENTOLIN HFA) 108 (90 Base) MCG/ACT inhaler Inhale 2 puffs into the lungs every 6 (six) hours as needed for wheezing or shortness of breath.   allopurinol (ZYLOPRIM) 100 MG tablet Take 200 mg by mouth daily.   ARIPiprazole (ABILIFY) 5 MG tablet TAKE ONE TABLET BY MOUTH EVERYDAY AT BEDTIME (Patient taking differently: Take 5 mg by mouth at bedtime.)   atorvastatin (LIPITOR) 40 MG tablet Take 1 tablet (40 mg total) by mouth daily. Please keep upcoming appointment in March for future refills, thank you.   busPIRone (BUSPAR) 10 MG tablet Take 1 tablet (10 mg total) by mouth 3 (three) times daily.   cetirizine (ZYRTEC) 10 MG tablet Take 10 mg by mouth at bedtime.    Cholecalciferol (VITAMIN D3 PO) Take 2,000 Units by mouth daily.   dapagliflozin propanediol (FARXIGA) 10 MG TABS tablet TAKE ONE TABLET BY MOUTH BEFORE BREAKFAST   diltiazem (CARDIZEM CD) 120 MG 24 hr capsule TAKE ONE CAPSULE BY MOUTH ONCE DAILY   ELIQUIS 5 MG TABS tablet TAKE ONE TABLET BY MOUTH TWICE DAILY   ferrous sulfate 324 (65 Fe) MG TBEC Take 324 mg by mouth daily with breakfast.    fluticasone  (FLONASE) 50 MCG/ACT nasal spray Place 2 sprays into both nostrils daily.    Fluticasone-Umeclidin-Vilant (TRELEGY ELLIPTA) 200-62.5-25 MCG/ACT AEPB INHALE 1 PUFF BY MOUTH INTO LUNGS ONCE DAILY   isosorbide mononitrate (IMDUR) 30 MG 24 hr tablet TAKE ONE TABLET BY MOUTH EVERY EVENING Needs appointment for further refills   Lacosamide 150 MG TABS Take 1 tablet (150  mg total) by mouth 2 (two) times daily.   levETIRAcetam (KEPPRA) 500 MG tablet Take 1 tablet (500 mg total) by mouth 2 (two) times daily.   metFORMIN (GLUCOPHAGE-XR) 500 MG 24 hr tablet Take 4 tablets (2,000 mg total) by mouth daily. (Patient taking differently: Take 1,000 mg by mouth 2 (two) times daily with a meal.)   metoprolol succinate (TOPROL-XL) 25 MG 24 hr tablet Take 1 tablet (25 mg total) by mouth daily.   mirtazapine (REMERON) 15 MG tablet TAKE ONE TABLET BY MOUTH EVERYDAY AT BEDTIME   omeprazole (PRILOSEC) 20 MG capsule Take 20 mg by mouth every morning.   potassium chloride SA (KLOR-CON M) 20 MEQ tablet TAKE TWO TABLETS BY MOUTH TWICE DAILY   prazosin (MINIPRESS) 2 MG capsule Take two tablets at bedtime. (Patient taking differently: Take 4 mg by mouth at bedtime.)   pregabalin (LYRICA) 150 MG capsule Take 1 capsule (150 mg total) by mouth 3 (three) times daily.   repaglinide (PRANDIN) 2 MG tablet TAKE TWO TABLETS BY MOUTH TWICE DAILY BEFORE A meal   spironolactone (ALDACTONE) 25 MG tablet Take 0.5 tablets (12.5 mg total) by mouth daily.   tirzepatide (MOUNJARO) 10 MG/0.5ML Pen Inject 10 mg into the skin once a week.   torsemide (DEMADEX) 20 MG tablet TAKE 4 TABLETS BY MOUTH EVERY MORNING and TAKE THREE TABLETS BY MOUTH EVERY EVENING (Patient taking differently: Take 60-80 mg by mouth See admin instructions. Take 4 tablets ('80mg'$ ) by mouth every morning and three tablets ('60mg'$ ) by mouth every evening.)   TRESIBA FLEXTOUCH 100 UNIT/ML FlexTouch Pen INJECT 40 UNITS into THE SKIN ONCE DAILY   VASCEPA 1 g capsule TAKE TWO CAPSULES  BY MOUTH TWICE DAILY   venlafaxine XR (EFFEXOR-XR) 75 MG 24 hr capsule Take 3 capsules (225 mg total) by mouth daily with breakfast.     Review of Systems      All other systems reviewed and are otherwise negative except as noted above.  Physical Exam    VS:  BP 118/70   Pulse 91   Ht '6\' 2"'$  (1.88 m)   Wt (!) 363 lb (164.7 kg)   SpO2 93%   BMI 46.61 kg/m  , BMI Body mass index is 46.61 kg/m.  Wt Readings from Last 3 Encounters:  10/12/22 (!) 363 lb (164.7 kg)  08/18/22 (!) 365 lb 6.4 oz (165.7 kg)  07/25/22 (!) 369 lb (167.4 kg)     GEN: Well nourished, well developed, in no acute distress. HEENT: normal. Neck: Supple, no JVD, carotid bruits, or masses. Cardiac: RRR, no murmurs, rubs, or gallops. No clubbing, cyanosis, 1+ edema, venous stasis changes.  Radials/PT 2+ and equal bilaterally.  Respiratory:  Respirations regular and unlabored, clear to auscultation bilaterally. GI: Soft, nontender, nondistended. MS: No deformity or atrophy. Skin: Warm and dry, no rash. Neuro:  Strength and sensation are intact. Psych: Normal affect.  Assessment & Plan    Chronic diastolic CHF -euvolemic on exam  -Continue current medications which include Eliquis 5 mg twice a day, Lipitor 40 mg daily, Farxiga 10 mg daily, diltiazem 120 mg daily, Vascepa 2 g twice a day, Imdur 30 mg daily, metoprolol succinate 25 mg daily, Minipress 2 mg at bedtime, spironolactone 12.5 mg daily, Demadex 80 mg in the morning and 60 mg at night, potassium replacement -Recommend follow-up with Dr. Aundra Dubin with heart failure  Aortic stenosis -couple of times was dizzy with standing up quickly -fall repeat echo -no syncope or presyncope  Atrial fibrillation -  he remains on Eliquis, metoprolol -no issues  OSA -uses CPAP  Obesity -He watches his diet very closely -Exercise is somewhat limited at the moment due to leg pain (follows up with vascular next week)  Hyperlipidemia -Last lipid panel with LDL 80,  HDL 30, triglycerides 200 -Continue current medications including Lipitor 40 mg daily, Vascepa 2 g twice daily, and add fenofibrate -Plan for follow-up lipid panel and LFTs in 8 weeks (can do with PCP         Disposition: Follow up 6 months with Fransico Him, MD or APP.  Signed, Elgie Collard, PA-C 10/12/2022, 2:49 PM [facility]

## 2022-10-11 NOTE — Progress Notes (Signed)
[facility]  PATIENT: Lawrence [name] DOB: Nov 29, 1955  REFERRING CLINICIAN: [name], PA-C HISTORY FROM: Patient and sister Harriette Ohara FOR VISIT: [facility] follow up   [place]:  Chief Complaint  Patient presents with   Follow-up    Rm 12, doing well  Reports no seizures   INTERVAL HISTORY 10/11/2022:  Lawrence [name] presents today for follow-up, Lawrence [name] is accompanied by wife.  Last visit was in November and since then Lawrence [name] has been doing well, denies any seizure or seizure-like severity.  Lawrence [name] is compliant with the medication, Keppra Vimpat. Lawrence [name] ins also on  Lyrica.  Lawrence [name] reports currently taking Lyrica twice daily instead of 3 times daily.  Lawrence [name] did have his Spinal stimulator with good control of his pain. Denies any side effect from the medication.  Reports that his sleep is OK, usually gets about 6 hrs with his CPAP machine. Lawrence [name] does sleep during the day while watching TV but does not take scheduled naps.    INTERVAL HISTORY 07/07/22:  Lawrence [name] presents today for follow-up, Lawrence [name] is accompanied by his sister Lovena Le.  In term of the seizures, Lawrence [name] denies any seizures but stated that lately has been having episodes of spasms, his arm will jerk uncontrollably and this can last up to an hour.  Lawrence [name] is compliant with his Keppra and Vimpat.  Lawrence [name] reports neuropathy pain is getting better after the spinal stimulator.  Lawrence [name] is still on Lyrica 150 mg 3 times daily.  Lawrence [name] continues with home PT 3 days a week and also once a week Lawrence [name] goes for [employer] classes.    Lawrence [name] does report some issues with short-term memory, Lawrence [name] is following with his doctors including psychiatry for depression.  Last month Lawrence [name] was admitted to the hospital for right-sided weakness and slurred speech and his MRI was negative for any acute stroke.  His symptoms continued to improve and Lawrence [name] was discharged home.   Denies any additional episodes of slurred speech, word finding difficulty or focal weakness.  Lawrence [name] reports last night, his leg gave out (left leg) and Lawrence [name] fell, denies any major injury.    INTERVAL HISTORY 03/24/2022 Patient presents today with partner [name]. Since last visit in February, Lawrence [name] has been doing well in term of seizures, on Keppra 500 mg BID until August 2nd when Lawrence [name] had a breakthrough seizure. Lawrence [name] was seen in the ED and Vimpat 150 mg BID. Lawrence [name] reports increase sleepiness while on Vimpat. Lawrence [name] reports another breakthough seizure last night but states that yesterday was a very busy and stressful day. Interval for sleep apnea, patient was seen and diagnosed with sleep apnea on CPAP but stated she had trouble using the CPAP machine at night and therefore has not been using it for quite some time. Lawrence [name] also have a spinal cord stimulator for his peripheral neuropathy, reports that it is working very well for him.   INTERVAL HISTORY 09/10/2021:  Patient presents today for follow-up with Remonia Richter, last visit was in September at that time Lawrence [name] did stop his Keppra and did not have any jerking motion of the right arm therefore plan was to observe patient off [place].  Since then Lawrence [name] has been doing fine until December 31 when Lawrence [name] presented to the hospital due to altered mental status.  Lawrence [name] was admitted for Toxic metabolic encephalopathy, severe sepsis in the setting of pneumonia and acute hypoxic respiratory failure.  During this time Lawrence [name] was noted to have right arm jerking and  period of unresponsiveness.  Lawrence [name] had a routine EEG which did not show any epileptiform discharge and no seizures.  Plan was to restart the patient on Keppra but 1000 mg twice daily.  Since discharge from the hospital patient has been reported increased daytime somnolence.  Lawrence [name] reported Lawrence [name] is very difficult for him to for him to stay up, Lawrence [name] felt like a zombie and also have issue with his memory.  Lawrence [name] still dealing with peripheral neuropathy and is on Lyrica 150 mg 3 times daily and also has severe sleep apnea.  No seizure-like activity since  being home.    HISTORY OF PRESENT ILLNESS:  This is a 67 year old man with past medical history of obesity, obstructive sleep apnea, diastolic heart failure, hypertension, hyperlipidemia, diabetes who is presenting with seizure-like activity.  Patient stated that Lawrence [name] was admitted in March of this year for pulmonary edema, acute hypoxic respiratory failure, and left lower extremity cellulitis.  During his admission Lawrence [name] was noted to have seizure-like activity.  Wife who is present today mentioned that patient started having tremor like activity in the upper extremity lasted about 30 seconds.  Lawrence [name] only had 1 episode.  Lawrence [name] was evaluated for seizure-like activity.  Lawrence [name] had MRI which was negative and EEG also which was negative.  Lawrence [name] was started on Keppra 1000 mg twice daily.  Patient stated after discharge from the hospital Lawrence [name] took the medication for 1 month and self discontinued.  Since leaving the hospital Lawrence [name] has not had any seizure-like activity.  Denies any previous seizure.  Denies any family history of seizures, denies any seizures factor.  His MRI was negative for any acute stroke or chronic stroke .   Patient also report continued lower extremity pain and numbness.  Lawrence [name] has a history of diabetic neuropathy and Lawrence [name] is on pregabalin 100 mg 3 times daily.  His hemoglobin A1c is still elevated, last hemoglobin A1c was 8.  Lawrence [name] is on both insulin and oral meds for his diabetes. Other issues discussed today Lawrence [name] is asleep.  Lawrence [name] had Lawrence [name] does have a diagnosis of obstructive sleep apnea Lawrence [name] is on a CPAP machine, reported Lawrence [name] take Lawrence [name] uses CPAP machine but still has been getting restful sleep..  The patient reported last time Lawrence [name] has a steep CPAP machine checked was about 3 years ago.  Lawrence [name] has not seen a sleep doctor since   Handedness: Right handed   Seizure Type: Unclear,   Current frequency: Only once   Any injuries from seizures:   Seizure risk factors: No seizure risk factor  Previous ASMs: Levetiracetam, Lacosamide    Currenty ASMs: Levetiracetam, Lacosamide   ASMs side effects: None   Brain Images: Normal MRI   Previous EEGs: Normal EEG   OTHER MEDICAL CONDITIONS: Obesity, OSA, HTN, DM, HLD, COPD, Diabetic neuropathy, Diastolic heart failure   REVIEW OF SYSTEMS: Full 14 system review of systems performed and negative with exception of: as noted in the HPI  ALLERGIES: Allergies  Allergen Reactions   Vancomycin Other (See Comments)    "Red Man Syndrome" 02/02/17: possible cause for rash under both arms   Niacin Other (See Comments)   Niacin And Related Other (See Comments)    Red man syndrome   Tubersol [Tuberculin, Ppd] Other (See Comments)    Reaction unknown   Doxycycline Rash and Other (See Comments)    HOME MEDICATIONS: Outpatient Medications Prior to Visit  Medication Sig Dispense Refill   acetaminophen (TYLENOL) 325 MG tablet Take 2 tablets (650  mg total) by mouth every 4 (four) hours as needed for mild pain (or temp > 37.5 C (99.5 F)).     albuterol (VENTOLIN HFA) 108 (90 Base) MCG/ACT inhaler Inhale 2 puffs into the lungs every 6 (six) hours as needed for wheezing or shortness of breath. 1 each 6   allopurinol (ZYLOPRIM) 100 MG tablet Take 200 mg by mouth daily.     ARIPiprazole (ABILIFY) 5 MG tablet TAKE ONE TABLET BY MOUTH EVERYDAY AT BEDTIME (Patient taking differently: Take 5 mg by mouth at bedtime.) 90 tablet 3   atorvastatin (LIPITOR) 40 MG tablet Take 1 tablet (40 mg total) by mouth daily. Please keep upcoming appointment in March for future refills, thank you. 30 tablet 0   busPIRone (BUSPAR) 10 MG tablet Take 1 tablet (10 mg total) by mouth 3 (three) times daily. 270 tablet 3   cetirizine (ZYRTEC) 10 MG tablet Take 10 mg by mouth at bedtime.      Cholecalciferol (VITAMIN D3 PO) Take 2,000 Units by mouth daily.     dapagliflozin propanediol (FARXIGA) 10 MG TABS tablet TAKE ONE TABLET BY MOUTH BEFORE BREAKFAST 90 tablet 3   diltiazem (CARDIZEM CD) 120 MG 24 hr capsule TAKE  ONE CAPSULE BY MOUTH ONCE DAILY 30 capsule 2   ELIQUIS 5 MG TABS tablet TAKE ONE TABLET BY MOUTH TWICE DAILY 180 tablet 2   ferrous sulfate 324 (65 Fe) MG TBEC Take 324 mg by mouth daily with breakfast.      fluticasone (FLONASE) 50 MCG/ACT nasal spray Place 2 sprays into both nostrils daily.      Fluticasone-Umeclidin-Vilant (TRELEGY ELLIPTA) 200-62.5-25 MCG/ACT AEPB INHALE 1 PUFF BY MOUTH INTO LUNGS ONCE DAILY 60 each 11   isosorbide mononitrate (IMDUR) 30 MG 24 hr tablet TAKE ONE TABLET BY MOUTH EVERY EVENING Needs appointment for further refills 15 tablet 0   metFORMIN (GLUCOPHAGE-XR) 500 MG 24 hr tablet Take 4 tablets (2,000 mg total) by mouth daily. (Patient taking differently: Take 1,000 mg by mouth 2 (two) times daily with a meal.) 360 tablet 3   metoprolol succinate (TOPROL-XL) 25 MG 24 hr tablet Take 1 tablet (25 mg total) by mouth daily. 90 tablet 1   mirtazapine (REMERON) 15 MG tablet TAKE ONE TABLET BY MOUTH EVERYDAY AT BEDTIME 30 tablet 2   omeprazole (PRILOSEC) 20 MG capsule Take 20 mg by mouth every morning.     potassium chloride SA (KLOR-CON M) 20 MEQ tablet TAKE TWO TABLETS BY MOUTH TWICE DAILY 270 tablet 3   prazosin (MINIPRESS) 2 MG capsule Take two tablets at bedtime. (Patient taking differently: Take 4 mg by mouth at bedtime.) 180 capsule 3   repaglinide (PRANDIN) 2 MG tablet TAKE TWO TABLETS BY MOUTH TWICE DAILY BEFORE A meal 120 tablet 0   spironolactone (ALDACTONE) 25 MG tablet Take 0.5 tablets (12.5 mg total) by mouth daily. 15 tablet 2   tirzepatide (MOUNJARO) 10 MG/0.5ML Pen Inject 10 mg into the skin once a week. 6 mL 1   torsemide (DEMADEX) 20 MG tablet TAKE 4 TABLETS BY MOUTH EVERY MORNING and TAKE THREE TABLETS BY MOUTH EVERY EVENING (Patient taking differently: Take 60-80 mg by mouth See admin instructions. Take 4 tablets ('80mg'$ ) by mouth every morning and three tablets ('60mg'$ ) by mouth every evening.) 450 tablet 3   TRESIBA FLEXTOUCH 100 UNIT/ML FlexTouch Pen INJECT  40 UNITS into THE SKIN ONCE DAILY 15 mL 2   VASCEPA 1 g capsule TAKE TWO CAPSULES BY MOUTH TWICE DAILY 120  capsule 5   venlafaxine XR (EFFEXOR-XR) 75 MG 24 hr capsule Take 3 capsules (225 mg total) by mouth daily with breakfast. 270 capsule 3   Lacosamide 150 MG TABS Take 1 tablet (150 mg total) by mouth 2 (two) times daily. 180 tablet 0   levETIRAcetam (KEPPRA) 500 MG tablet TAKE ONE TABLET BY MOUTH TWICE DAILY 180 tablet 1   pregabalin (LYRICA) 150 MG capsule TAKE ONE CAPSULE BY MOUTH THREE TIMES DAILY 270 capsule 0   No facility-administered medications prior to visit.    PAST MEDICAL HISTORY: Past Medical History:  Diagnosis Date   Acquired dilation of ascending aorta and aortic root ([place])    42m by echo 01/2021   Adenomatous colon polyp 2007   Anemia    Anxiety    Aortic stenosis    moderate AS by echo 01/2021   Asthma    BPH without obstruction/lower urinary tract symptoms 02/22/2017   Chronic diastolic (congestive) heart failure (HCC)    Chronic venous stasis 03/07/2019   COPD (chronic obstructive pulmonary disease) (HCC)    Coronary artery calcification seen on CAT scan    Depression    Diabetic neuropathy ([place] 09/11/2019   History of colon polyps 08/24/2018   Hypertension    Morbid obesity (HCC)    OSA (obstructive sleep apnea)    Pain due to onychomycosis of toenails of both feet 09/11/2019   Peripheral neuropathy 02/22/2017   Primary osteoarthritis, left shoulder 03/05/2017   PTSD (post-traumatic stress disorder)    Pure hypercholesterolemia    QT prolongation 03/07/2019   Seizures (HCC)    Shortness of breath    Sinus tachycardia 03/07/2019   Sleep apnea    CPAP   Type 2 diabetes mellitus with vascular disease ([facility] 09/11/2019    PAST SURGICAL HISTORY: Past Surgical History:  Procedure Laterality Date   ENDOVENOUS ABLATION SAPHENOUS VEIN W/ LASER Right 08/20/2020   endovenous laser ablation right greater saphenous vein by CGae GallopMD    JOINT  REPLACEMENT     left knee replacement x 2   KNEE ARTHROSCOPY Bilateral    LEFT HEART CATH AND CORONARY ANGIOGRAPHY N/A 01/17/2018   Procedure: LEFT HEART CATH AND CORONARY ANGIOGRAPHY;  Surgeon: HLeonie Man MD;  [facility];  Service: Cardiovascular;  Laterality: N/A;   PRESSURE SENSOR/CARDIOMEMS N/A 08/26/2021   Procedure: PRESSURE SENSOR/CARDIOMEMS;  Surgeon: MLarey Dresser MD;  [facility];  Service: Cardiovascular;  Laterality: N/A;   RIGHT HEART CATH N/A 08/26/2021   Procedure: RIGHT HEART CATH;  Surgeon: MLarey Dresser MD;  [facility];  Service: Cardiovascular;  Laterality: N/A;   TEE WITHOUT CARDIOVERSION N/A 08/26/2021   Procedure: TRANSESOPHAGEAL ECHOCARDIOGRAM (TEE);  Surgeon: MLarey Dresser MD;  [facility];  Service: Cardiovascular;  Laterality: N/A;   UMBILICAL HERNIA REPAIR      FAMILY HISTORY: Family History  Problem Relation Age of Onset   CAD Maternal Grandfather    Diabetes Other    Diabetes Mellitus II Neg Hx    Colon cancer Neg Hx    Esophageal cancer Neg Hx    Inflammatory bowel disease Neg Hx    Liver disease Neg Hx    Pancreatic cancer Neg Hx    Rectal cancer Neg Hx    Stomach cancer Neg Hx    Sleep apnea Neg Hx     SOCIAL HISTORY: Social History   Socioeconomic History   Marital status: Significant Other  Spouse name: [name]   Number of children: 1   Years of education: Not on file   Highest education level: Associate degree: academic program  Occupational History   Occupation: retired    Comment: Artist  Tobacco Use   Smoking status: Former    Packs/day: 1.00    Years: 44.00    Total pack years: 44.00    Types: Cigarettes    Quit date: 03/07/2016    Years since quitting: 6.6    Passive exposure: Past   Smokeless tobacco: Never  Vaping Use   Vaping Use: Never used  Substance and Sexual Activity   Alcohol use: Yes    Comment: rare   Drug use: No    Sexual activity: Not on file  Other Topics Concern   Not on file  Social History Narrative   Lives with wife [name]   Right Handed   Drinks 1-2 cups caffeine   Social Determinants of Health   Financial Resource Strain: Medium Risk (06/08/2021)   Overall Financial Resource Strain (CARDIA)    Difficulty of Paying Living Expenses: Somewhat hard  Food Insecurity: Food Insecurity Present (06/08/2021)   Hunger Vital Sign    Worried About Running Out of Food in the Last Year: Sometimes true    Ran Out of Food in the Last Year: Never true  Transportation Needs: Unmet Transportation Needs (06/08/2021)   PRAPARE - Hydrologist (Medical): Yes    Lack of Transportation (Non-Medical): No  Physical Activity: Not on file  Stress: Not on file  Social Connections: Not on file  Intimate Partner Violence: Not on file     PHYSICAL EXAM  GENERAL EXAM/CONSTITUTIONAL: Vitals:  Vitals:   10/11/22 1426  BP: 133/81  Pulse: 83    There is no height or weight on file to calculate BMI. Wt Readings from Last 3 Encounters:  08/18/22 (!) 365 lb 6.4 oz (165.7 kg)  07/25/22 (!) 369 lb (167.4 kg)  05/25/22 (!) 369 lb (167.4 kg)   Patient is in no distress; well developed, nourished and groomed; neck is supple. Lawrence [name] is morbidly obese. Flat affect   EYES: Visual fields full to confrontation, Extraocular movements intacts,   MUSCULOSKELETAL: Gait, strength, tone, movements noted in Neurologic exam below  NEUROLOGIC: MENTAL STATUS:  awake, alert, oriented to person, place and time recent and remote memory intact normal attention and concentration language fluent, comprehension intact, naming intact fund of knowledge appropriate  CRANIAL NERVE:  2nd, 3rd, 4th, 6th -visual fields full to confrontation, extraocular muscles intact, no nystagmus 5th - facial sensation symmetric 7th - facial strength symmetric 8th - hearing intact 9th - palate elevates symmetrically,  uvula midline 11th - shoulder shrug symmetric 12th - tongue protrusion midline  MOTOR:  normal bulk and tone, at least antigravity in the BUE and BLE. LLE weak when compared to the right   SENSORY:  Decrease sensation to light touch  COORDINATION:  finger-nose-finger, fine finger movements normal  GAIT/STATION:  Deferred    DIAGNOSTIC DATA (LABS, IMAGING, TESTING) - I reviewed patient records, labs, notes, testing and imaging myself where available.  Lab Results  Component Value Date   WBC 11.0 (H) 05/25/2022   HGB 15.6 05/25/2022   HCT 46.0 05/25/2022   MCV 90.9 05/25/2022   PLT 243 05/25/2022      Component Value Date/Time   NA 140 05/25/2022 2110   NA 136 01/07/2021 0000   K 4.0 05/25/2022 2110   CL  99 05/25/2022 2110   CO2 29 05/25/2022 2101   GLUCOSE 173 (H) 05/25/2022 2110   BUN 30 (H) 05/25/2022 2110   BUN 22 01/07/2021 0000   CREATININE 1.20 05/25/2022 2110   CALCIUM 9.1 05/25/2022 2101   PROT 6.9 05/25/2022 2101   ALBUMIN 3.7 05/25/2022 2101   AST 14 (L) 05/25/2022 2101   ALT 18 05/25/2022 2101   ALKPHOS 78 05/25/2022 2101   BILITOT 0.3 05/25/2022 2101   GFRNONAA >60 05/25/2022 2101   GFRAA 77 05/20/2020 1038   Lab Results  Component Value Date   CHOL 150 05/26/2022   HDL 30 (L) 05/26/2022   LDLCALC 80 05/26/2022   TRIG 200 (H) 05/26/2022   Lab Results  Component Value Date   HGBA1C 6.9 09/05/2022   Lab Results  Component Value Date   VITAMINB12 223 05/26/2022   Lab Results  Component Value Date   TSH 1.182 05/26/2022   MRI Brain 05/26/22 Normal brain MRI for age. No acute intracranial infarct or other abnormality.  MRI Brain 02/08/2021 No evidence of recent infarction, hemorrhage, or mass. No significant change since recent prior study.  EEG 01/26/2021: Normal routine EEG  EEG 04/27/2021: This study is within normal limits. No seizures or epileptiform discharges were seen throughout the recording. If suspicion for ictal-interictal  activity remains a concern, a prolonged study can  be considered.   EEG 08/09/2021: This study is within normal limits. No seizures or epileptiform discharges were seen throughout the recording.   I personally reviewed brain Images and previous EEG reports.   ASSESSMENT AND PLAN  67 y.o. year old male  with morbid obesity, hypertension, hyperlipidemia, diabetes mellitus type 2, diabetes neuropathy, diastolic heart failure, seizure disorder who is presenting for seizure follow up.  No seizures since last visit, currently doing well on Vimpat 150 mg twice daily, Keppra 500 mg twice daily and Lyrica 150 mg twice daily. We will continue current medications, encourage patient to continue with weight loss, to remain active and to continue his CPAP at night, also advised him to take scheduled nap with his CPAP. Lawrence [name] voices understanding. I will see him in 6 months for follow up.     1. Seizure disorder ([place])   2. Diabetic peripheral neuropathy ([place])   3. OSA on CPAP   4. Morbid obesity with BMI of 45.0-49.9, adult [facility])       Patient Instructions  Continue with Keppra 500 mg twice daily Continue with Vimpat 150 mg twice daily Continue Lyrica 150 mg twice daily Continue your other medications Continue to use the CPAP at night Consider scheduled nap in the afternoon while using the CPAP to decrease your daytime somnolence Encourage physical therapy, weight loss Follow-up in 6 months or sooner if worse      Per [facility] statutes, patients with seizures are not allowed to drive until they have been seizure-free for six months.  Other recommendations include using caution when using heavy equipment or power tools. Avoid working on ladders or at heights. Take showers instead of baths.  Do not swim alone.  Ensure the water temperature is not too high on the home water heater. Do not go swimming alone. Do not lock yourself in a room alone (i.e. bathroom). When caring for infants or small  children, sit down when holding, feeding, or changing them to minimize risk of injury to the child in the event you have a seizure. Maintain good sleep hygiene. Avoid alcohol.  Also recommend adequate sleep,  hydration, good diet and minimize stress.   During the Seizure  - First, ensure adequate ventilation and place patients on the floor on their left side  Loosen clothing around the neck and ensure the airway is patent. If the patient is clenching the teeth, do not force the mouth open with any object as this can cause severe damage - Remove all items from the surrounding that can be hazardous. The patient may be oblivious to what's happening and may not even know what Lawrence [name] or she is doing. If the patient is confused and wandering, either gently guide him/her away and block access to outside areas - Reassure the individual and be comforting - Call 911. In most cases, the seizure ends before EMS arrives. However, there are cases when seizures may last over 3 to 5 minutes. Or the individual may have developed breathing difficulties or severe injuries. If a pregnant patient or a person with diabetes develops a seizure, it is prudent to call an ambulance. - Finally, if the patient does not regain full consciousness, then call EMS. Most patients will remain confused for about 45 to 90 minutes after a seizure, so you must use judgment in calling for help. - Avoid restraints but make sure the patient is in a bed with padded side rails - Place the individual in a lateral position with the neck slightly flexed; this will help the saliva drain from the mouth and prevent the tongue from falling backward - Remove all nearby furniture and other hazards from the area - Provide verbal assurance as the individual is regaining consciousness - Provide the patient with privacy if possible - Call for help and start treatment as ordered by the caregiver   After the Seizure (Postictal Stage)  After a seizure, most  patients experience confusion, fatigue, muscle pain and/or a headache. Thus, one should permit the individual to sleep. For the next few days, reassurance is essential. Being calm and helping reorient the person is also of importance.  Most seizures are painless and end spontaneously. Seizures are not harmful to others but can lead to complications such as stress on the lungs, brain and the heart. Individuals with prior lung problems may develop labored breathing and respiratory distress.     No orders of the defined types were placed in this encounter.    Meds ordered this encounter  Medications   Lacosamide 150 MG TABS    Sig: Take 1 tablet (150 mg total) by mouth 2 (two) times daily.    Dispense:  180 tablet    Refill:  3    This prescription reflects the most recent change. Please void all other prescriptions on file.   levETIRAcetam (KEPPRA) 500 MG tablet    Sig: Take 1 tablet (500 mg total) by mouth 2 (two) times daily.    Dispense:  180 tablet    Refill:  3   pregabalin (LYRICA) 150 MG capsule    Sig: Take 1 capsule (150 mg total) by mouth 3 (three) times daily.    Dispense:  270 capsule    Refill:  3    Return in about 6 months (around 04/13/2023).    Alric Ran, MD 10/11/2022, 8:21 PM  [facility] [address] [place], [place] [address] [phone]

## 2022-10-11 NOTE — Patient Instructions (Signed)
Continue with Keppra 500 mg twice daily Continue with Vimpat 150 mg twice daily Continue Lyrica 150 mg twice daily Continue your other medications Continue to use the CPAP at night Consider scheduled nap in the afternoon while using the CPAP to decrease your daytime somnolence Encourage physical therapy, weight loss Follow-up in 6 months or sooner if worse

## 2022-10-12 ENCOUNTER — Encounter: Payer: Self-pay | Admitting: Physician Assistant

## 2022-10-12 ENCOUNTER — Ambulatory Visit: Payer: Medicare HMO | Attending: Nurse Practitioner | Admitting: Physician Assistant

## 2022-10-12 ENCOUNTER — Telehealth: Payer: Self-pay | Admitting: Cardiology

## 2022-10-12 VITALS — BP 118/70 | HR 91 | Ht 74.0 in | Wt 363.0 lb

## 2022-10-12 DIAGNOSIS — G4733 Obstructive sleep apnea (adult) (pediatric): Secondary | ICD-10-CM | POA: Diagnosis not present

## 2022-10-12 DIAGNOSIS — I10 Essential (primary) hypertension: Secondary | ICD-10-CM

## 2022-10-12 DIAGNOSIS — E782 Mixed hyperlipidemia: Secondary | ICD-10-CM | POA: Diagnosis not present

## 2022-10-12 DIAGNOSIS — I480 Paroxysmal atrial fibrillation: Secondary | ICD-10-CM | POA: Diagnosis not present

## 2022-10-12 DIAGNOSIS — I5032 Chronic diastolic (congestive) heart failure: Secondary | ICD-10-CM

## 2022-10-12 DIAGNOSIS — E7800 Pure hypercholesterolemia, unspecified: Secondary | ICD-10-CM

## 2022-10-12 DIAGNOSIS — R6889 Other general symptoms and signs: Secondary | ICD-10-CM | POA: Diagnosis not present

## 2022-10-12 DIAGNOSIS — I350 Nonrheumatic aortic (valve) stenosis: Secondary | ICD-10-CM | POA: Diagnosis not present

## 2022-10-12 MED ORDER — ATORVASTATIN CALCIUM 40 MG PO TABS: 40.0000 mg | ORAL_TABLET | Freq: Every day | ORAL | 3 refills | Status: DC

## 2022-10-12 MED ORDER — FENOFIBRATE 145 MG PO TABS: 145.0000 mg | ORAL_TABLET | Freq: Every day | ORAL | 3 refills | Status: DC

## 2022-10-12 MED ORDER — ISOSORBIDE MONONITRATE ER 30 MG PO TB24: ORAL_TABLET | ORAL | 3 refills | Status: DC

## 2022-10-12 NOTE — Patient Instructions (Signed)
Medication Instructions:    Your physician recommends that you continue on your current medications as directed. Please refer to the Current Medication list given to you today.   *If you need a refill on your cardiac medications before your next appointment, please call your pharmacy*   Lab Work:  RETURN FOR FASTING LABS  LFT AND LIPIDS  8 WEEKS     If you have labs (blood work) drawn today and your tests are completely normal, you will receive your results only by: [place] (if you have MyChart) OR A paper copy in the mail If you have any lab test that is abnormal or we need to change your treatment, we will call you to review the results.   Testing/Procedures: NONE ORDERED  TODAY     Follow-Up: At [facility], you and your health needs are our priority.  As part of our continuing mission to provide you with exceptional heart care, we have created designated Provider Care Teams.  These Care Teams include your primary Cardiologist (physician) and Advanced Practice Providers (APPs -  Physician Assistants and Nurse Practitioners) who all work together to provide you with the care you need, when you need it.  We recommend signing up for the patient portal called "MyChart".  Sign up information is provided on this After Visit Summary.  MyChart is used to connect with patients for Virtual Visits (Telemedicine).  Patients are able to view lab/test results, encounter notes, upcoming appointments, etc.  Non-urgent messages can be sent to your provider as well.   To learn more about what you can do with MyChart, go to [email or web address].    Your next appointment:  1 MONTH WITH DR [facility]  6 month(s)  Provider:   Fransico Him, MD     Other Instructions   Low-Sodium Eating Plan  Sodium, which is an element that makes up salt, helps you maintain a healthy balance of fluids in your body. Too much sodium can increase your blood pressure and cause fluid and waste to be  held in your body. Your health care provider or dietitian may recommend following this plan if you have high blood pressure (hypertension), kidney disease, liver disease, or heart failure. Eating less sodium can help lower your blood pressure, reduce swelling, and protect your heart, liver, and kidneys. What are tips for following this plan? Reading food labels The Nutrition Facts label lists the amount of sodium in one serving of the food. If you eat more than one serving, you must multiply the listed amount of sodium by the number of servings. Choose foods with less than 140 mg of sodium per serving. Avoid foods with 300 mg of sodium or more per serving. Shopping  Look for lower-sodium products, often labeled as "low-sodium" or "no salt added." Always check the sodium content, even if foods are labeled as "unsalted" or "no salt added." Buy fresh foods. Avoid canned foods and pre-made or frozen meals. Avoid canned, cured, or processed meats. Buy breads that have less than 80 mg of sodium per slice. Cooking  Eat more home-cooked food and less restaurant, buffet, and fast food. Avoid adding salt when cooking. Use salt-free seasonings or herbs instead of table salt or sea salt. Check with your health care provider or pharmacist before using salt substitutes. Cook with plant-based oils, such as canola, sunflower, or olive oil. Meal planning When eating at a restaurant, ask that your food be prepared with less salt or no salt, if possible. Avoid dishes labeled  as brined, pickled, cured, smoked, or made with soy sauce, miso, or teriyaki sauce. Avoid foods that contain MSG (monosodium glutamate). MSG is sometimes added to [place] food, bouillon, and some canned foods. Make meals that can be grilled, baked, poached, roasted, or steamed. These are generally made with less sodium. General information Most people on this plan should limit their sodium intake to 1,500-2,000 mg (milligrams) of sodium  each day. What foods should I eat? Fruits Fresh, frozen, or canned fruit. Fruit juice. Vegetables Fresh or frozen vegetables. "No salt added" canned vegetables. "No salt added" tomato sauce and paste. Low-sodium or reduced-sodium tomato and vegetable juice. Grains Low-sodium cereals, including oats, puffed wheat and rice, and shredded wheat. Low-sodium crackers. Unsalted rice. Unsalted pasta. Low-sodium bread. Whole-grain breads and whole-grain pasta. Meats and other proteins Fresh or frozen (no salt added) meat, poultry, seafood, and fish. Low-sodium canned tuna and salmon. Unsalted nuts. Dried peas, beans, and lentils without added salt. Unsalted canned beans. Eggs. Unsalted nut butters. Dairy Milk. Soy milk. Cheese that is naturally low in sodium, such as ricotta cheese, fresh mozzarella, or Swiss cheese. Low-sodium or reduced-sodium cheese. Cream cheese. Yogurt. Seasonings and condiments Fresh and dried herbs and spices. Salt-free seasonings. Low-sodium mustard and ketchup. Sodium-free salad dressing. Sodium-free light mayonnaise. Fresh or refrigerated horseradish. Lemon juice. Vinegar. Other foods Homemade, reduced-sodium, or low-sodium soups. Unsalted popcorn and pretzels. Low-salt or salt-free chips. The items listed above may not be a complete list of foods and beverages you can eat. Contact a dietitian for more information. What foods should I avoid? Vegetables Sauerkraut, pickled vegetables, and relishes. Olives. [place] fries. Onion rings. Regular canned vegetables (not low-sodium or reduced-sodium). Regular canned tomato sauce and paste (not low-sodium or reduced-sodium). Regular tomato and vegetable juice (not low-sodium or reduced-sodium). Frozen vegetables in sauces. Grains Instant hot cereals. Bread stuffing, pancake, and biscuit mixes. Croutons. Seasoned rice or pasta mixes. Noodle soup cups. Boxed or frozen macaroni and cheese. Regular salted crackers. Self-rising flour. Meats  and other proteins Meat or fish that is salted, canned, smoked, spiced, or pickled. Precooked or cured meat, such as sausages or meat loaves. [place]ce Salines. Ham. Pepperoni. Hot dogs. Corned beef. Chipped beef. Salt pork. Jerky. Pickled herring. Anchovies and sardines. Regular canned tuna. Salted nuts. Dairy Processed cheese and cheese spreads. Hard cheeses. Cheese curds. Blue cheese. Feta cheese. String cheese. Regular cottage cheese. Buttermilk. Canned milk. Fats and oils Salted butter. Regular margarine. Ghee. Bacon fat. Seasonings and condiments Onion salt, garlic salt, seasoned salt, table salt, and sea salt. Canned and packaged gravies. Worcestershire sauce. Tartar sauce. Barbecue sauce. Teriyaki sauce. Soy sauce, including reduced-sodium. Steak sauce. Fish sauce. Oyster sauce. Cocktail sauce. Horseradish that you find on the shelf. Regular ketchup and mustard. Meat flavorings and tenderizers. Bouillon cubes. Hot sauce. Pre-made or packaged marinades. Pre-made or packaged taco seasonings. Relishes. Regular salad dressings. Salsa. Other foods Salted popcorn and pretzels. Corn chips and puffs. Potato and tortilla chips. Canned or dried soups. Pizza. Frozen entrees and pot pies. The items listed above may not be a complete list of foods and beverages you should avoid. Contact a dietitian for more information. Summary Eating less sodium can help lower your blood pressure, reduce swelling, and protect your heart, liver, and kidneys. Most people on this plan should limit their sodium intake to 1,500-2,000 mg (milligrams) of sodium each day. Canned, boxed, and frozen foods are high in sodium. Restaurant foods, fast foods, and pizza are also very high in sodium. You also get sodium  by adding salt to food. Try to cook at home, eat more fresh fruits and vegetables, and eat less fast food and canned, processed, or prepared foods. This information is not intended to replace advice given to you by your health care  provider. Make sure you discuss any questions you have with your health care provider. Document Revised: 07/01/2019 Document Reviewed: 06/26/2019 Elsevier Patient Education  [place] Eating a healthy diet is important for the health of your heart. A heart-healthy eating plan includes: Eating less unhealthy fats. Eating more healthy fats. Eating less salt in your food. Salt is also called sodium. Making other changes in your diet. Talk with your doctor or a diet specialist (dietitian) to create an eating plan that is right for you. What is my plan? Your doctor may recommend an eating plan that includes: Total fat: ______% or less of total calories a day. Saturated fat: ______% or less of total calories a day. Cholesterol: less than _________mg a day. Sodium: less than _________mg a day. What are tips for following this plan? Cooking Avoid frying your food. Try to bake, boil, grill, or broil it instead. You can also reduce fat by: Removing the skin from poultry. Removing all visible fats from meats. Steaming vegetables in water or broth. Meal planning  At meals, divide your plate into four equal parts: Fill one-half of your plate with vegetables and green salads. Fill one-fourth of your plate with whole grains. Fill one-fourth of your plate with lean protein foods. Eat 2-4 cups of vegetables per day. One cup of vegetables is: 1 cup (91 g) broccoli or cauliflower florets. 2 medium carrots. 1 large bell pepper. 1 large sweet potato. 1 large tomato. 1 medium white potato. 2 cups (150 g) raw leafy greens. Eat 1-2 cups of fruit per day. One cup of fruit is: 1 small apple 1 large banana 1 cup (237 g) mixed fruit, 1 large orange,  cup (82 g) dried fruit, 1 cup (240 mL) 100% fruit juice. Eat more foods that have soluble fiber. These are apples, broccoli, carrots, beans, peas, and barley. Try to get 20-30 g of fiber per day. Eat 4-5  servings of nuts, legumes, and seeds per week: 1 serving of dried beans or legumes equals  cup (90 g) cooked. 1 serving of nuts is  oz (12 almonds, 24 pistachios, or 7 walnut halves). 1 serving of seeds equals  oz (8 g). General information Eat more home-cooked food. Eat less restaurant, buffet, and fast food. Limit or avoid alcohol. Limit foods that are high in starch and sugar. Avoid fried foods. Lose weight if you are overweight. Keep track of how much salt (sodium) you eat. This is important if you have high blood pressure. Ask your doctor to tell you more about this. Try to add vegetarian meals each week. Fats Choose healthy fats. These include olive oil and canola oil, flaxseeds, walnuts, almonds, and seeds. Eat more omega-3 fats. These include salmon, mackerel, sardines, tuna, flaxseed oil, and ground flaxseeds. Try to eat fish at least 2 times each week. Check food labels. Avoid foods with trans fats or high amounts of saturated fat. Limit saturated fats. These are often found in animal products, such as meats, butter, and cream. These are also found in plant foods, such as palm oil, palm kernel oil, and coconut oil. Avoid foods with partially hydrogenated oils in them. These have trans fats. Examples are stick margarine, some tub margarines, cookies, crackers,  and other baked goods. What foods should I eat? Fruits All fresh, canned (in natural juice), or frozen fruits. Vegetables Fresh or frozen vegetables (raw, steamed, roasted, or grilled). Green salads. Grains Most grains. Choose whole wheat and whole grains most of the time. Rice and pasta, including brown rice and pastas made with whole wheat. Meats and other proteins Lean, well-trimmed beef, veal, pork, and lamb. Chicken and [place] without skin. All fish and shellfish. Wild duck, rabbit, pheasant, and venison. Egg whites or low-cholesterol egg substitutes. Dried beans, peas, lentils, and tofu. Seeds and most  nuts. Dairy Low-fat or nonfat cheeses, including ricotta and mozzarella. Skim or 1% milk that is liquid, powdered, or evaporated. Buttermilk that is made with low-fat milk. Nonfat or low-fat yogurt. Fats and oils Non-hydrogenated (trans-free) margarines. Vegetable oils, including soybean, sesame, sunflower, olive, peanut, safflower, corn, canola, and cottonseed. Salad dressings or mayonnaise made with a vegetable oil. Beverages Mineral water. Coffee and tea. Diet carbonated beverages. Sweets and desserts Sherbet, gelatin, and fruit ice. Small amounts of dark chocolate. Limit all sweets and desserts. Seasonings and condiments All seasonings and condiments. The items listed above may not be a complete list of foods and drinks you can eat. Contact a dietitian for more options. What foods should I avoid? Fruits Canned fruit in heavy syrup. Fruit in cream or butter sauce. Fried fruit. Limit coconut. Vegetables Vegetables cooked in cheese, cream, or butter sauce. Fried vegetables. Grains Breads that are made with saturated or trans fats, oils, or whole milk. Croissants. Sweet rolls. Donuts. High-fat crackers, such as cheese crackers. Meats and other proteins Fatty meats, such as hot dogs, ribs, sausage, bacon, rib-eye roast or steak. High-fat deli meats, such as salami and bologna. Caviar. Domestic duck and goose. Organ meats, such as liver. Dairy Cream, sour cream, cream cheese, and creamed cottage cheese. Whole-milk cheeses. Whole or 2% milk that is liquid, evaporated, or condensed. Whole buttermilk. Cream sauce or high-fat cheese sauce. Yogurt that is made from whole milk. Fats and oils Meat fat, or shortening. Cocoa butter, hydrogenated oils, palm oil, coconut oil, palm kernel oil. Solid fats and shortenings, including bacon fat, salt pork, lard, and butter. Nondairy cream substitutes. Salad dressings with cheese or sour cream. Beverages Regular sodas and juice drinks with added  sugar. Sweets and desserts Frosting. Pudding. Cookies. Cakes. Pies. Milk chocolate or white chocolate. Buttered syrups. Full-fat ice cream or ice cream drinks. The items listed above may not be a complete list of foods and drinks to avoid. Contact a dietitian for more information. Summary Heart-healthy meal planning includes eating less unhealthy fats, eating more healthy fats, and making other changes in your diet. Eat a balanced diet. This includes fruits and vegetables, low-fat or nonfat dairy, lean protein, nuts and legumes, whole grains, and heart-healthy oils and fats. This information is not intended to replace advice given to you by your health care provider. Make sure you discuss any questions you have with your health care provider. Document Revised: 08/30/2021 Document Reviewed: 08/30/2021 Elsevier Patient Education  [place].

## 2022-10-12 NOTE — Telephone Encounter (Signed)
Kristen ([place]) from [place] calling in to clarify patient's CHF medications as patient has both cardizem and metoprolol on med list. Cyril Mourning is asking if this is correct as patient has not attended CHF clinic in awhile. Forwarded to [employer] as patient has visit with her today.

## 2022-10-12 NOTE — Telephone Encounter (Signed)
Pt c/o medication issue:  1. Name of Medication: diltiazem (CARDIZEM CD) 120 MG 24 hr capsule  metoprolol succinate (TOPROL-XL) 25 MG 24 hr tablet   2. How are you currently taking this medication (dosage and times per day)? TAKE ONE CAPSULE BY MOUTH ONCE DAILY  Take 1 tablet (25 mg total) by mouth daily.   3. Are you having a reaction (difficulty breathing--STAT)? no  4. What is your medication issue? Kristen from [place] is calling to find out if patient needs to be on both of these medication as the same time.

## 2022-10-14 ENCOUNTER — Other Ambulatory Visit: Payer: Self-pay | Admitting: Endocrinology

## 2022-10-19 ENCOUNTER — Ambulatory Visit: Payer: Medicare HMO | Admitting: Vascular Surgery

## 2022-10-19 ENCOUNTER — Ambulatory Visit (HOSPITAL_COMMUNITY)
Admission: RE | Admit: 2022-10-19 | Discharge: 2022-10-19 | Disposition: A | Discharge status: Home / Self Care | Payer: Medicare HMO | Source: Ambulatory Visit | Attending: Vascular Surgery | Admitting: Vascular Surgery

## 2022-10-19 ENCOUNTER — Encounter: Payer: Self-pay | Admitting: Vascular Surgery

## 2022-10-19 VITALS — BP 114/73 | HR 82 | Temp 97.8°F | Resp 18 | Ht 74.0 in | Wt 368.0 lb

## 2022-10-19 DIAGNOSIS — I83012 Varicose veins of right lower extremity with ulcer of calf: Secondary | ICD-10-CM | POA: Insufficient documentation

## 2022-10-19 DIAGNOSIS — I872 Venous insufficiency (chronic) (peripheral): Secondary | ICD-10-CM

## 2022-10-19 DIAGNOSIS — L97219 Non-pressure chronic ulcer of right calf with unspecified severity: Secondary | ICD-10-CM | POA: Diagnosis not present

## 2022-10-19 DIAGNOSIS — R6889 Other general symptoms and signs: Secondary | ICD-10-CM | POA: Diagnosis not present

## 2022-10-19 NOTE — Progress Notes (Signed)
REASON FOR VISIT:   Follow-up of chronic venous insufficiency  MEDICAL ISSUES:   COMBINED CHRONIC VENOUS INSUFFICIENCY AND LYMPHEDEMA: This patient has CEAP C6 venous disease.  He has an ulcer on the lateral aspect of his left leg as documented below.  We have again discussed importance of leg elevation.  He has congestive heart failure and cannot lie flat but does his best to elevate his leg.  He does have a hospital bed.  He has a hard time keeping his thigh-high compression stockings on but continues to use compression as best he can.  He also tries to walk every 1/2 hour according to his wife.  He cannot find a lift to get into the pool to do water aerobics which I think is also very helpful for patients with venous disease.  I encouraged him to avoid prolonged sitting and standing.  Thus, he has continued to exhaust all conservative measures to treat his advanced venous and lymphatic disease.  I think he has combined chronic venous insufficiency and lymphedema.  Based on his venous duplex scan today and my SonoSite exam I think he would be a candidate for laser ablation of the left great saphenous vein down to the mid thigh and also staged laser ablation of the left anterior accessory saphenous vein which is are also markedly dilated.  He does have reflux in the small saphenous vein however currently this vein is not especially dilated and is about 4 mm in diameter.  I have discussed the indications for endovenous laser ablation of the left GSV, and subsequent staged laser ablation of the left AASV that is to lower the pressure in the veins and potentially help relieve the symptoms from venous hypertension.  I have also discussed alternative options such as conservative treatment as described above. I have discussed the potential complications of the procedure, including bleeding, bruising, leg swelling, deep venous thrombosis (<1% risk), or failure of the vein to close <1% risk).  I have also  explained that venous insufficiency is a chronic disease, and that the patient is at risk for recurrent varicose veins in the future.  All of the patient's questions were encouraged and answered. They are agreeable to proceed.    HPI:   Lawrence [name] is a pleasant 67 y.o. male who I last saw on 06/30/2021.  He has undergone successful laser ablation of the right small saphenous vein.  He has deep venous reflux on the right.  On the left side he has deep venous reflux but had minimal superficial venous reflux in the past.  He comes in for a 1 year follow-up study with a follow-up reflux study.  Of note the wounds on his right leg had healed when I saw him last.  Since I saw him last he has developed a recurrent ulcer on the lateral aspect of his left leg.  This began about a month ago.  He has had wounds off and on in the left leg for over a year.  He describes aching pain and heaviness in the left leg.  He states that his symptoms significantly improved after laser ablation of the small saphenous vein on the right.  He does try to elevate his legs and has a hospital bed although he cannot lie flat because of his congestive heart failure.  He does try to walk is much as he can but does spend a fair amount of time in his motorized scooter.  He states that he has  been trying to lose weight and his wife tells me he is lost over 100 pounds.  Past Medical History:  Diagnosis Date   Acquired dilation of ascending aorta and aortic root ([place])    44m by echo 01/2021   Adenomatous colon polyp 2007   Anemia    Anxiety    Aortic stenosis    moderate AS by echo 01/2021   Asthma    BPH without obstruction/lower urinary tract symptoms 02/22/2017   Chronic diastolic (congestive) heart failure (HCC)    Chronic venous stasis 03/07/2019   COPD (chronic obstructive pulmonary disease) (HCC)    Coronary artery calcification seen on CAT scan    Depression    Diabetic neuropathy ([place] 09/11/2019   History of  colon polyps 08/24/2018   Hypertension    Morbid obesity (HCC)    OSA (obstructive sleep apnea)    Pain due to onychomycosis of toenails of both feet 09/11/2019   Peripheral neuropathy 02/22/2017   Primary osteoarthritis, left shoulder 03/05/2017   PTSD (post-traumatic stress disorder)    Pure hypercholesterolemia    QT prolongation 03/07/2019   Seizures (HCC)    Shortness of breath    Sinus tachycardia 03/07/2019   Sleep apnea    CPAP   Type 2 diabetes mellitus with vascular disease ([facility] 09/11/2019    Family History  Problem Relation Age of Onset   CAD Maternal Grandfather    Diabetes Other    Diabetes Mellitus II Neg Hx    Colon cancer Neg Hx    Esophageal cancer Neg Hx    Inflammatory bowel disease Neg Hx    Liver disease Neg Hx    Pancreatic cancer Neg Hx    Rectal cancer Neg Hx    Stomach cancer Neg Hx    Sleep apnea Neg Hx     SOCIAL HISTORY: Social History   Tobacco Use   Smoking status: Former    Packs/day: 1.00    Years: 44.00    Total pack years: 44.00    Types: Cigarettes    Quit date: 03/07/2016    Years since quitting: 6.6    Passive exposure: Past   Smokeless tobacco: Never  Substance Use Topics   Alcohol use: Yes    Comment: rare    Allergies  Allergen Reactions   Vancomycin Other (See Comments)    "Red Man Syndrome" 02/02/17: possible cause for rash under both arms   Niacin Other (See Comments)   Niacin And Related Other (See Comments)    Red man syndrome   Tubersol [Tuberculin, Ppd] Other (See Comments)    Reaction unknown   Doxycycline Rash and Other (See Comments)    Current Outpatient Medications  Medication Sig Dispense Refill   acetaminophen (TYLENOL) 325 MG tablet Take 2 tablets (650 mg total) by mouth every 4 (four) hours as needed for mild pain (or temp > 37.5 C (99.5 F)).     albuterol (VENTOLIN HFA) 108 (90 Base) MCG/ACT inhaler Inhale 2 puffs into the lungs every 6 (six) hours as needed for wheezing or shortness of breath.  1 each 6   allopurinol (ZYLOPRIM) 100 MG tablet Take 200 mg by mouth daily.     ARIPiprazole (ABILIFY) 5 MG tablet TAKE ONE TABLET BY MOUTH EVERYDAY AT BEDTIME (Patient taking differently: Take 5 mg by mouth at bedtime.) 90 tablet 3   atorvastatin (LIPITOR) 40 MG tablet Take 1 tablet (40 mg total) by mouth daily. 90 tablet 3   busPIRone (BUSPAR) 10 MG  tablet Take 1 tablet (10 mg total) by mouth 3 (three) times daily. 270 tablet 3   cetirizine (ZYRTEC) 10 MG tablet Take 10 mg by mouth at bedtime.      Cholecalciferol (VITAMIN D3 PO) Take 2,000 Units by mouth daily.     dapagliflozin propanediol (FARXIGA) 10 MG TABS tablet TAKE ONE TABLET BY MOUTH BEFORE BREAKFAST 90 tablet 3   diltiazem (CARDIZEM CD) 120 MG 24 hr capsule TAKE ONE CAPSULE BY MOUTH ONCE DAILY 30 capsule 2   ELIQUIS 5 MG TABS tablet TAKE ONE TABLET BY MOUTH TWICE DAILY 180 tablet 2   fenofibrate (TRICOR) 145 MG tablet Take 1 tablet (145 mg total) by mouth daily. 90 tablet 3   ferrous sulfate 324 (65 Fe) MG TBEC Take 324 mg by mouth daily with breakfast.      fluticasone (FLONASE) 50 MCG/ACT nasal spray Place 2 sprays into both nostrils daily.      Fluticasone-Umeclidin-Vilant (TRELEGY ELLIPTA) 200-62.5-25 MCG/ACT AEPB INHALE 1 PUFF BY MOUTH INTO LUNGS ONCE DAILY 60 each 11   isosorbide mononitrate (IMDUR) 30 MG 24 hr tablet TAKE ONE TABLET BY MOUTH EVERY EVENING 90 tablet 3   Lacosamide 150 MG TABS Take 1 tablet (150 mg total) by mouth 2 (two) times daily. 180 tablet 3   levETIRAcetam (KEPPRA) 500 MG tablet Take 1 tablet (500 mg total) by mouth 2 (two) times daily. 180 tablet 3   metFORMIN (GLUCOPHAGE-XR) 500 MG 24 hr tablet Take 4 tablets (2,000 mg total) by mouth daily. (Patient taking differently: Take 1,000 mg by mouth 2 (two) times daily with a meal.) 360 tablet 3   metoprolol succinate (TOPROL-XL) 25 MG 24 hr tablet Take 1 tablet (25 mg total) by mouth daily. 90 tablet 1   mirtazapine (REMERON) 15 MG tablet TAKE ONE TABLET BY  MOUTH EVERYDAY AT BEDTIME 30 tablet 2   omeprazole (PRILOSEC) 20 MG capsule Take 20 mg by mouth every morning.     potassium chloride SA (KLOR-CON M) 20 MEQ tablet TAKE TWO TABLETS BY MOUTH TWICE DAILY 270 tablet 3   prazosin (MINIPRESS) 2 MG capsule Take two tablets at bedtime. (Patient taking differently: Take 4 mg by mouth at bedtime.) 180 capsule 3   pregabalin (LYRICA) 150 MG capsule Take 1 capsule (150 mg total) by mouth 3 (three) times daily. 270 capsule 3   repaglinide (PRANDIN) 2 MG tablet TAKE TWO TABLETS BY MOUTH TWICE DAILY BEFORE A meal 120 tablet 0   spironolactone (ALDACTONE) 25 MG tablet Take 0.5 tablets (12.5 mg total) by mouth daily. 15 tablet 2   tirzepatide (MOUNJARO) 10 MG/0.5ML Pen Inject 10 mg into the skin once a week. 6 mL 1   torsemide (DEMADEX) 20 MG tablet TAKE 4 TABLETS BY MOUTH EVERY MORNING and TAKE THREE TABLETS BY MOUTH EVERY EVENING (Patient taking differently: Take 60-80 mg by mouth See admin instructions. Take 4 tablets ('80mg'$ ) by mouth every morning and three tablets ('60mg'$ ) by mouth every evening.) 450 tablet 3   TRESIBA FLEXTOUCH 100 UNIT/ML FlexTouch Pen INJECT 40 UNITS into THE SKIN ONCE DAILY 15 mL 3   VASCEPA 1 g capsule TAKE TWO CAPSULES BY MOUTH TWICE DAILY 120 capsule 5   venlafaxine XR (EFFEXOR-XR) 75 MG 24 hr capsule Take 3 capsules (225 mg total) by mouth daily with breakfast. 270 capsule 3   No current facility-administered medications for this visit.    REVIEW OF SYSTEMS:  '[X]'$  denotes positive finding, '[ ]'$  denotes negative finding Cardiac  Comments:  Chest pain or chest pressure:    Shortness of breath upon exertion:    Short of breath when lying flat:    Irregular heart rhythm:        Vascular    Pain in calf, thigh, or hip brought on by ambulation:    Pain in feet at night that wakes you up from your sleep:     Blood clot in your veins:    Leg swelling:  x       Pulmonary    Oxygen at home:    Productive cough:     Wheezing:          Neurologic    Sudden weakness in arms or legs:     Sudden numbness in arms or legs:     Sudden onset of difficulty speaking or slurred speech:    Temporary loss of vision in one eye:     Problems with dizziness:         Gastrointestinal    Blood in stool:     Vomited blood:         Genitourinary    Burning when urinating:     Blood in urine:        Psychiatric    Major depression:         Hematologic    Bleeding problems:    Problems with blood clotting too easily:        Skin    Rashes or ulcers:        Constitutional    Fever or chills:     PHYSICAL EXAM:   Vitals:   10/19/22 1531  BP: 114/73  Pulse: 82  Resp: 18  Temp: 97.8 F (36.6 C)  TempSrc: Temporal  SpO2: 91%  Weight: (!) 368 lb (166.9 kg)  Height: '6\' 2"'$  (1.88 m)   Body mass index is 47.25 kg/m.  GENERAL: The patient is a well-nourished male, in no acute distress. The vital signs are documented above. CARDIAC: There is a regular rate and rhythm.  VASCULAR: I do not detect carotid bruits. He has a biphasic dorsalis pedis and posterior tibial signal bilaterally. He has significant bilateral lower extremity swelling which is more significant on the left side.  He has lymphedema with lymphorrhea on the left and also significant hyperpigmentation.  He has a wound along the lateral aspect of his left leg as documented below.  I did look at his left great saphenous vein and anterior accessory saphenous vein myself with the SonoSite and both are markedly dilated with reflux in the proximal and mid thigh. PULMONARY: There is good air exchange bilaterally without wheezing or rales. ABDOMEN: Soft and non-tender with normal pitched bowel sounds.  MUSCULOSKELETAL: There are no major deformities or cyanosis. NEUROLOGIC: No focal weakness or paresthesias are detected. SKIN: There are no ulcers or rashes noted. PSYCHIATRIC: The patient has a normal affect.  DATA:    VENOUS DUPLEX: I have independently  interpreted the patient's duplex of the left lower extremity today.  This shows no evidence of DVT.  There is deep venous reflux in the common femoral vein.  There is superficial venous reflux in the left great saphenous vein, anterior accessory saphenous vein, and small saphenous vein.  The diameters of the veins and areas of reflux are noted on the diagram below.    A total of 43 minutes was spent on this visit. 23 minutes was face to face time. More than 50% of the time was spent on  counseling and coordinating with the patient.    [name] [facility] [phone]

## 2022-10-20 ENCOUNTER — Ambulatory Visit (INDEPENDENT_AMBULATORY_CARE_PROVIDER_SITE_OTHER): Payer: Medicare HMO | Admitting: Endocrinology

## 2022-10-20 ENCOUNTER — Encounter: Payer: Self-pay | Admitting: Endocrinology

## 2022-10-20 VITALS — BP 104/72 | HR 80 | Ht 74.0 in

## 2022-10-20 DIAGNOSIS — R6889 Other general symptoms and signs: Secondary | ICD-10-CM | POA: Diagnosis not present

## 2022-10-20 DIAGNOSIS — E1165 Type 2 diabetes mellitus with hyperglycemia: Secondary | ICD-10-CM | POA: Diagnosis not present

## 2022-10-20 DIAGNOSIS — Z794 Long term (current) use of insulin: Secondary | ICD-10-CM | POA: Diagnosis not present

## 2022-10-20 MED ORDER — NOVOLOG FLEXPEN 100 UNIT/ML ~~LOC~~ SOPN: PEN_INJECTOR | SUBCUTANEOUS | 1 refills | Status: DC

## 2022-10-20 MED ORDER — TIRZEPATIDE 15 MG/0.5ML ~~LOC~~ SOAJ: 15.0000 mg | SUBCUTANEOUS | 1 refills | Status: DC

## 2022-10-20 MED ORDER — BD PEN NEEDLE SHORT U/F 31G X 8 MM MISC: 3 refills | Status: DC

## 2022-10-20 NOTE — Patient Instructions (Addendum)
Tresiba 44 units daily  Novolog 8-10 units before Hi Carb meals, keep sugar 2 hours after meals under 180 at least, no Prandin at same time  New dose Mounjaro next Rx

## 2022-10-20 NOTE — Progress Notes (Signed)
Patient ID: Lawrence [name], male   DOB: 09/28/1955, 67 y.o.   MRN: [id number]           Reason for Appointment: Type II Diabetes follow-up   History of Present Illness   Diagnosis date: 2018  Previous history: A1c in the past has ranged from 7.1-9.4  He has been on progressively increasing number of medications for his diabetes over the last 5 years or so Insulin was apparently used with small doses of insulin for 2 months in 2022 Usually A1c has not been below 7   Recent history:     Non-insulin hypoglycemic drugs: Farxiga 10 mg daily, metformin ER 2 g daily, Prandin 4 mg twice daily, Mounjaro 10 mg weekly     Insulin regimen: Tresiba 40 units daily    Side effects from medications: None  Current self management, blood sugar patterns and problems identified:  His A1c is 6.9 compared to 7.2 in October  He has higher readings overnight compared to his last visit generally when his dose was lowered to 40 units from 71 However previously was not assessed with his freestyle [employer] and he did not bring his meter on this visit He still thinks he has some increased appetite at times with tending to eat snacks likes chips periodically He thinks that most of his high sugars after meals are related to excess carbohydrate intake even with taking branded His postprandial readings are inconsistent with no specific rise after any meal on a consistent basis Some of his highest readings are during the night or in the afternoon  Currently no hypoglycemia  Again unable to exercise because of his decreased mobility  Exercise: Unable to, in wheelchair      Monitors blood glucose: 4x daily on freestyle libre  Overnight blood sugars are overall moderately high ranging between about 140 up to 240 but generally averaging around 180 without hypoglycemia Generally blood sugars during the day are also in the similar range on an average However hyperglycemic spikes are seen at all times  during the day and night inconsistently on different days Lowest blood sugars overall are between about 9 PM and 1 AM No hypoglycemia during the day Postprandial readings as above are variable but postprandial spikes periodically at all different times including overnight with snacks  CGM use % of time   2-week average/GV   Time in range 58     %  % Time Above 180 37  % Time above 250 5  % Time Below 70 0     PRE-MEAL Fasting Lunch Dinner Bedtime Overall  Glucose range:       Averages: 166       POST-MEAL PC Breakfast PC Lunch PC Dinner  Glucose range:     Averages: 174 181 181    Dietician visit: Most recent: Years ago Weight control:  Wt Readings from Last 3 Encounters:  10/19/22 (!) 368 lb (166.9 kg)  10/12/22 (!) 363 lb (164.7 kg)  08/18/22 (!) 365 lb 6.4 oz (165.7 kg)          Complications:   Vascular disease, neuropathy  Diabetes labs:  Lab Results  Component Value Date   HGBA1C 6.9 09/05/2022   HGBA1C 7.2 (H) 05/26/2022   HGBA1C 7.5 (A) 03/23/2022   Lab Results  Component Value Date   MICROALBUR <0.7 03/23/2022   LDLCALC 80 05/26/2022   CREATININE 1.20 05/25/2022     Allergies as of 10/20/2022       Reactions  Vancomycin Other (See Comments)   "Red Man Syndrome" 02/02/17: possible cause for rash under both arms   Niacin Other (See Comments)   Niacin And Related Other (See Comments)   Red man syndrome   Tubersol [tuberculin, Ppd] Other (See Comments)   Reaction unknown   Doxycycline Rash, Other (See Comments)        Medication List        Accurate as of October 20, 2022  7:21 PM. If you have any questions, ask your nurse or doctor.          STOP taking these medications    tirzepatide 10 MG/0.5ML Pen Commonly known as: MOUNJARO Replaced by: tirzepatide 15 MG/0.5ML Pen Stopped by: Elayne Snare, MD       TAKE these medications    acetaminophen 325 MG tablet Commonly known as: TYLENOL Take 2 tablets (650 mg total) by mouth every  4 (four) hours as needed for mild pain (or temp > 37.5 C (99.5 F)).   albuterol 108 (90 Base) MCG/ACT inhaler Commonly known as: VENTOLIN HFA Inhale 2 puffs into the lungs every 6 (six) hours as needed for wheezing or shortness of breath.   allopurinol 100 MG tablet Commonly known as: ZYLOPRIM Take 200 mg by mouth daily.   ARIPiprazole 5 MG tablet Commonly known as: ABILIFY TAKE ONE TABLET BY MOUTH EVERYDAY AT BEDTIME What changed:  how much to take how to take this when to take this additional instructions   atorvastatin 40 MG tablet Commonly known as: LIPITOR Take 1 tablet (40 mg total) by mouth daily.   B-D ULTRAFINE III SHORT PEN 31G X 8 MM Misc Generic drug: Insulin Pen Needle Use for pen Started by: Elayne Snare, MD   busPIRone 10 MG tablet Commonly known as: BUSPAR Take 1 tablet (10 mg total) by mouth 3 (three) times daily.   cetirizine 10 MG tablet Commonly known as: ZYRTEC Take 10 mg by mouth at bedtime.   dapagliflozin propanediol 10 MG Tabs tablet Commonly known as: Farxiga TAKE ONE TABLET BY MOUTH BEFORE BREAKFAST   diltiazem 120 MG 24 hr capsule Commonly known as: CARDIZEM CD TAKE ONE CAPSULE BY MOUTH ONCE DAILY   Eliquis 5 MG Tabs tablet Generic drug: apixaban TAKE ONE TABLET BY MOUTH TWICE DAILY   fenofibrate 145 MG tablet Commonly known as: Tricor Take 1 tablet (145 mg total) by mouth daily.   ferrous sulfate 324 (65 Fe) MG Tbec Take 324 mg by mouth daily with breakfast.   fluticasone 50 MCG/ACT nasal spray Commonly known as: FLONASE Place 2 sprays into both nostrils daily.   isosorbide mononitrate 30 MG 24 hr tablet Commonly known as: IMDUR TAKE ONE TABLET BY MOUTH EVERY EVENING   Lacosamide 150 MG Tabs Take 1 tablet (150 mg total) by mouth 2 (two) times daily.   levETIRAcetam 500 MG tablet Commonly known as: KEPPRA Take 1 tablet (500 mg total) by mouth 2 (two) times daily.   metFORMIN 500 MG 24 hr tablet Commonly known as:  GLUCOPHAGE-XR Take 4 tablets (2,000 mg total) by mouth daily. What changed:  how much to take when to take this   metoprolol succinate 25 MG 24 hr tablet Commonly known as: TOPROL-XL Take 1 tablet (25 mg total) by mouth daily.   mirtazapine 15 MG tablet Commonly known as: REMERON TAKE ONE TABLET BY MOUTH EVERYDAY AT BEDTIME   NovoLOG FlexPen 100 UNIT/ML FlexPen Generic drug: insulin aspart 10 units before meals, keep sugar 2 hours after meals under  180 at least Started by: Elayne Snare, MD   omeprazole 20 MG capsule Commonly known as: PRILOSEC Take 20 mg by mouth every morning.   potassium chloride SA 20 MEQ tablet Commonly known as: KLOR-CON M TAKE TWO TABLETS BY MOUTH TWICE DAILY   prazosin 2 MG capsule Commonly known as: MINIPRESS Take two tablets at bedtime. What changed:  how much to take how to take this when to take this additional instructions   pregabalin 150 MG capsule Commonly known as: LYRICA Take 1 capsule (150 mg total) by mouth 3 (three) times daily.   repaglinide 2 MG tablet Commonly known as: PRANDIN TAKE TWO TABLETS BY MOUTH TWICE DAILY BEFORE A meal   spironolactone 25 MG tablet Commonly known as: ALDACTONE Take 0.5 tablets (12.5 mg total) by mouth daily.   tirzepatide 15 MG/0.5ML Pen Commonly known as: MOUNJARO Inject 15 mg into the skin once a week. Replaces: tirzepatide 10 MG/0.5ML Pen Started by: Elayne Snare, MD   torsemide 20 MG tablet Commonly known as: DEMADEX TAKE 4 TABLETS BY MOUTH EVERY MORNING and TAKE THREE TABLETS BY MOUTH EVERY EVENING What changed: See the new instructions.   Trelegy Ellipta 200-62.5-25 MCG/ACT Aepb Generic drug: Fluticasone-Umeclidin-Vilant INHALE 1 PUFF BY MOUTH INTO LUNGS ONCE DAILY   Tresiba FlexTouch 100 UNIT/ML FlexTouch Pen Generic drug: insulin degludec INJECT 40 UNITS into THE SKIN ONCE DAILY   Vascepa 1 g capsule Generic drug: icosapent Ethyl TAKE TWO CAPSULES BY MOUTH TWICE DAILY    venlafaxine XR 75 MG 24 hr capsule Commonly known as: EFFEXOR-XR Take 3 capsules (225 mg total) by mouth daily with breakfast.   VITAMIN D3 PO Take 2,000 Units by mouth daily.        Allergies:  Allergies  Allergen Reactions   Vancomycin Other (See Comments)    "Red Man Syndrome" 02/02/17: possible cause for rash under both arms   Niacin Other (See Comments)   Niacin And Related Other (See Comments)    Red man syndrome   Tubersol [Tuberculin, Ppd] Other (See Comments)    Reaction unknown   Doxycycline Rash and Other (See Comments)    Past Medical History:  Diagnosis Date   Acquired dilation of ascending aorta and aortic root (HCC)    9m by echo 01/2021   Adenomatous colon polyp 2007   Anemia    Anxiety    Aortic stenosis    moderate AS by echo 01/2021   Asthma    BPH without obstruction/lower urinary tract symptoms 02/22/2017   Chronic diastolic (congestive) heart failure (HCC)    Chronic venous stasis 03/07/2019   COPD (chronic obstructive pulmonary disease) (HCC)    Coronary artery calcification seen on CAT scan    Depression    Diabetic neuropathy ([place] 09/11/2019   History of colon polyps 08/24/2018   Hypertension    Morbid obesity (HCC)    OSA (obstructive sleep apnea)    Pain due to onychomycosis of toenails of both feet 09/11/2019   Peripheral neuropathy 02/22/2017   Primary osteoarthritis, left shoulder 03/05/2017   PTSD (post-traumatic stress disorder)    Pure hypercholesterolemia    QT prolongation 03/07/2019   Seizures (HCC)    Shortness of breath    Sinus tachycardia 03/07/2019   Sleep apnea    CPAP   Type 2 diabetes mellitus with vascular disease ([place] 09/11/2019    Past Surgical History:  Procedure Laterality Date   ENDOVENOUS ABLATION SAPHENOUS VEIN W/ LASER Right 08/20/2020   endovenous laser ablation right  greater saphenous vein by Gae Gallop MD    JOINT REPLACEMENT     left knee replacement x 2   KNEE ARTHROSCOPY Bilateral     LEFT HEART CATH AND CORONARY ANGIOGRAPHY N/A 01/17/2018   Procedure: LEFT HEART CATH AND CORONARY ANGIOGRAPHY;  Surgeon: Leonie Man, MD;  Location: [place];  Service: Cardiovascular;  Laterality: N/A;   PRESSURE SENSOR/CARDIOMEMS N/A 08/26/2021   Procedure: PRESSURE SENSOR/CARDIOMEMS;  Surgeon: Larey Dresser, MD;  Location: [facility];  Service: Cardiovascular;  Laterality: N/A;   RIGHT HEART CATH N/A 08/26/2021   Procedure: RIGHT HEART CATH;  Surgeon: Larey Dresser, MD;  Location: [facility];  Service: Cardiovascular;  Laterality: N/A;   TEE WITHOUT CARDIOVERSION N/A 08/26/2021   Procedure: TRANSESOPHAGEAL ECHOCARDIOGRAM (TEE);  Surgeon: Larey Dresser, MD;  [facility];  Service: Cardiovascular;  Laterality: N/A;   UMBILICAL HERNIA REPAIR      Family History  Problem Relation Age of Onset   CAD Maternal Grandfather    Diabetes Other    Diabetes Mellitus II Neg Hx    Colon cancer Neg Hx    Esophageal cancer Neg Hx    Inflammatory bowel disease Neg Hx    Liver disease Neg Hx    Pancreatic cancer Neg Hx    Rectal cancer Neg Hx    Stomach cancer Neg Hx    Sleep apnea Neg Hx     Social History:  reports that he quit smoking about 6 years ago. His smoking use included cigarettes. He has a 44.00 pack-year smoking history. He has been exposed to tobacco smoke. He has never used smokeless tobacco. He reports current alcohol use. He reports that he does not use drugs.  Review of Systems:  Hypertension: Currently on metoprolol, diltiazem, spironolactone and prazosin  BP Readings from Last 3 Encounters:  10/20/22 104/72  10/19/22 114/73  10/12/22 118/70    Lipids: Managed by cardiology, LDL excellent on atorvastatin 40 mg    Lab Results  Component Value Date   CHOL 150 05/26/2022   CHOL 142 03/03/2022   CHOL 163 12/23/2021   Lab Results  Component Value Date   HDL 30 (L) 05/26/2022   HDL 30 (L) 03/03/2022   HDL 35 (L) 12/23/2021    Lab Results  Component Value Date   LDLCALC 80 05/26/2022   LDLCALC 60 03/03/2022   LDLCALC 62 12/23/2021   Lab Results  Component Value Date   TRIG 200 (H) 05/26/2022   TRIG 261 (H) 03/03/2022   TRIG 330 (H) 12/23/2021   Lab Results  Component Value Date   CHOLHDL 5.0 05/26/2022   CHOLHDL 4.7 03/03/2022   CHOLHDL 4.7 12/23/2021   No results found for: "LDLDIRECT"  Eye exams: Reportedly no retinopathy and up-to-date with follow-up  He has numbness in his feet which is variable from neuropathy  He is being treated with CPAP for his OSA   Examination:   BP 104/72 (BP Location: Left Arm, Patient Position: Sitting, Cuff Size: Normal)   Pulse 80   Ht '6\' 2"'$  (1.88 m)   SpO2 98%   BMI 47.25 kg/m   Body mass index is 47.25 kg/m.    ASSESSMENT/ PLAN:    Diabetes type 2 with severe obesity:   See history of present illness for detailed discussion of current diabetes management, blood sugar patterns and problems identified  He is on a 4 drug regimen including Mounjaro 10 mg, metformin and Farxiga. Insulin  dose 40 units of Toujeo currently  Last A1c was 6.9 and improving  Blood glucose control is recently not as good with GMI 7.5 Today blood sugar patterns were analyzed in detail with the freestyle [employer] as above He does appear to be needing higher dose of basal insulin Most of his hyperglycemia is related to variable carbohydrate intake as discussed above Does not appear to be having any significant weight loss with Mounjaro or controlled appetite  Recommendations:  Go back to Toujeo to 44 instead of 40 and may need to increase further if fasting readings are to continue to be higher Also he needs to try and make sure he takes this regularly every day  Today discussed in detail the need for mealtime insulin to cover postprandial spikes, action of mealtime insulin, use of the insulin pen, timing and action of the rapid acting insulin as well as starting dose  and dosage titration to target the two-hour reading of under 180 He will likely need 8 to 10 units when he is eating relatively higher carbohydrate meals and if needed go up higher to keep the postprandial reading controlled At the same time he will skip the Prandin at that meal Increase Mounjaro to 15 mg and he will let us know if he does not tolerate this Hopefully he can get back on snacks especially at night Continue Farxiga 10 mg daily  Total visit time for evaluation and management and counseling = 30 minutes  Patient Instructions  Tresiba 44 units daily  Novolog 8-10 units before Hi Carb meals, keep sugar 2 hours after meals under 180 at least, no Prandin at same time  New dose Mounjaro next Rx   Elayne Snare 10/20/2022, 7:21 PM

## 2022-10-24 ENCOUNTER — Encounter: Payer: Self-pay | Admitting: Endocrinology

## 2022-10-26 ENCOUNTER — Inpatient Hospital Stay (HOSPITAL_COMMUNITY)
Admission: EM | Admit: 2022-10-26 | Discharge: 2022-10-31 | DRG: 291 | Disposition: A | Discharge status: Home Health | Payer: Medicare HMO | Attending: Internal Medicine | Admitting: Internal Medicine

## 2022-10-26 ENCOUNTER — Other Ambulatory Visit: Payer: Self-pay

## 2022-10-26 ENCOUNTER — Encounter (HOSPITAL_COMMUNITY): Payer: Self-pay

## 2022-10-26 ENCOUNTER — Emergency Department (HOSPITAL_COMMUNITY): Payer: Medicare HMO

## 2022-10-26 DIAGNOSIS — I639 Cerebral infarction, unspecified: Secondary | ICD-10-CM | POA: Diagnosis present

## 2022-10-26 DIAGNOSIS — Z833 Family history of diabetes mellitus: Secondary | ICD-10-CM

## 2022-10-26 DIAGNOSIS — I110 Hypertensive heart disease with heart failure: Secondary | ICD-10-CM | POA: Diagnosis present

## 2022-10-26 DIAGNOSIS — I10 Essential (primary) hypertension: Secondary | ICD-10-CM | POA: Diagnosis not present

## 2022-10-26 DIAGNOSIS — Z66 Do not resuscitate: Secondary | ICD-10-CM | POA: Diagnosis present

## 2022-10-26 DIAGNOSIS — R21 Rash and other nonspecific skin eruption: Secondary | ICD-10-CM | POA: Diagnosis present

## 2022-10-26 DIAGNOSIS — M19012 Primary osteoarthritis, left shoulder: Secondary | ICD-10-CM | POA: Diagnosis present

## 2022-10-26 DIAGNOSIS — R0602 Shortness of breath: Secondary | ICD-10-CM

## 2022-10-26 DIAGNOSIS — E7800 Pure hypercholesterolemia, unspecified: Secondary | ICD-10-CM | POA: Diagnosis present

## 2022-10-26 DIAGNOSIS — Z7984 Long term (current) use of oral hypoglycemic drugs: Secondary | ICD-10-CM

## 2022-10-26 DIAGNOSIS — M109 Gout, unspecified: Secondary | ICD-10-CM

## 2022-10-26 DIAGNOSIS — R0902 Hypoxemia: Secondary | ICD-10-CM | POA: Diagnosis not present

## 2022-10-26 DIAGNOSIS — Z794 Long term (current) use of insulin: Secondary | ICD-10-CM | POA: Diagnosis not present

## 2022-10-26 DIAGNOSIS — M1711 Unilateral primary osteoarthritis, right knee: Secondary | ICD-10-CM | POA: Diagnosis present

## 2022-10-26 DIAGNOSIS — F4310 Post-traumatic stress disorder, unspecified: Secondary | ICD-10-CM | POA: Diagnosis present

## 2022-10-26 DIAGNOSIS — Z6841 Body Mass Index (BMI) 40.0 and over, adult: Secondary | ICD-10-CM | POA: Diagnosis not present

## 2022-10-26 DIAGNOSIS — I739 Peripheral vascular disease, unspecified: Secondary | ICD-10-CM | POA: Diagnosis present

## 2022-10-26 DIAGNOSIS — N400 Enlarged prostate without lower urinary tract symptoms: Secondary | ICD-10-CM | POA: Diagnosis present

## 2022-10-26 DIAGNOSIS — I480 Paroxysmal atrial fibrillation: Secondary | ICD-10-CM | POA: Diagnosis present

## 2022-10-26 DIAGNOSIS — Z7985 Long-term (current) use of injectable non-insulin antidiabetic drugs: Secondary | ICD-10-CM

## 2022-10-26 DIAGNOSIS — R202 Paresthesia of skin: Secondary | ICD-10-CM | POA: Diagnosis not present

## 2022-10-26 DIAGNOSIS — J9601 Acute respiratory failure with hypoxia: Secondary | ICD-10-CM | POA: Diagnosis present

## 2022-10-26 DIAGNOSIS — J9811 Atelectasis: Secondary | ICD-10-CM | POA: Diagnosis not present

## 2022-10-26 DIAGNOSIS — M25561 Pain in right knee: Secondary | ICD-10-CM | POA: Diagnosis present

## 2022-10-26 DIAGNOSIS — E876 Hypokalemia: Secondary | ICD-10-CM | POA: Diagnosis not present

## 2022-10-26 DIAGNOSIS — Z887 Allergy status to serum and vaccine status: Secondary | ICD-10-CM

## 2022-10-26 DIAGNOSIS — Z96652 Presence of left artificial knee joint: Secondary | ICD-10-CM | POA: Diagnosis present

## 2022-10-26 DIAGNOSIS — G40909 Epilepsy, unspecified, not intractable, without status epilepticus: Secondary | ICD-10-CM

## 2022-10-26 DIAGNOSIS — Z1152 Encounter for screening for COVID-19: Secondary | ICD-10-CM

## 2022-10-26 DIAGNOSIS — I5033 Acute on chronic diastolic (congestive) heart failure: Secondary | ICD-10-CM | POA: Diagnosis present

## 2022-10-26 DIAGNOSIS — I440 Atrioventricular block, first degree: Secondary | ICD-10-CM | POA: Diagnosis present

## 2022-10-26 DIAGNOSIS — I878 Other specified disorders of veins: Secondary | ICD-10-CM | POA: Diagnosis present

## 2022-10-26 DIAGNOSIS — I350 Nonrheumatic aortic (valve) stenosis: Secondary | ICD-10-CM

## 2022-10-26 DIAGNOSIS — R569 Unspecified convulsions: Secondary | ICD-10-CM | POA: Diagnosis not present

## 2022-10-26 DIAGNOSIS — Z9989 Dependence on other enabling machines and devices: Secondary | ICD-10-CM

## 2022-10-26 DIAGNOSIS — Z7401 Bed confinement status: Secondary | ICD-10-CM | POA: Diagnosis not present

## 2022-10-26 DIAGNOSIS — E871 Hypo-osmolality and hyponatremia: Secondary | ICD-10-CM | POA: Diagnosis not present

## 2022-10-26 DIAGNOSIS — J449 Chronic obstructive pulmonary disease, unspecified: Secondary | ICD-10-CM | POA: Diagnosis present

## 2022-10-26 DIAGNOSIS — I69354 Hemiplegia and hemiparesis following cerebral infarction affecting left non-dominant side: Secondary | ICD-10-CM | POA: Diagnosis not present

## 2022-10-26 DIAGNOSIS — E1169 Type 2 diabetes mellitus with other specified complication: Secondary | ICD-10-CM | POA: Diagnosis present

## 2022-10-26 DIAGNOSIS — Z881 Allergy status to other antibiotic agents status: Secondary | ICD-10-CM

## 2022-10-26 DIAGNOSIS — F32A Depression, unspecified: Secondary | ICD-10-CM | POA: Diagnosis present

## 2022-10-26 DIAGNOSIS — E119 Type 2 diabetes mellitus without complications: Secondary | ICD-10-CM | POA: Diagnosis present

## 2022-10-26 DIAGNOSIS — L03115 Cellulitis of right lower limb: Secondary | ICD-10-CM | POA: Diagnosis present

## 2022-10-26 DIAGNOSIS — Z888 Allergy status to other drugs, medicaments and biological substances status: Secondary | ICD-10-CM

## 2022-10-26 DIAGNOSIS — I509 Heart failure, unspecified: Secondary | ICD-10-CM | POA: Diagnosis present

## 2022-10-26 DIAGNOSIS — G4733 Obstructive sleep apnea (adult) (pediatric): Secondary | ICD-10-CM | POA: Diagnosis present

## 2022-10-26 DIAGNOSIS — Z95811 Presence of heart assist device: Secondary | ICD-10-CM | POA: Diagnosis not present

## 2022-10-26 DIAGNOSIS — J90 Pleural effusion, not elsewhere classified: Secondary | ICD-10-CM | POA: Diagnosis not present

## 2022-10-26 DIAGNOSIS — J4489 Other specified chronic obstructive pulmonary disease: Secondary | ICD-10-CM | POA: Diagnosis present

## 2022-10-26 DIAGNOSIS — E1140 Type 2 diabetes mellitus with diabetic neuropathy, unspecified: Secondary | ICD-10-CM | POA: Diagnosis present

## 2022-10-26 DIAGNOSIS — Z79899 Other long term (current) drug therapy: Secondary | ICD-10-CM | POA: Diagnosis not present

## 2022-10-26 DIAGNOSIS — E66813 Obesity, class 3: Secondary | ICD-10-CM | POA: Diagnosis present

## 2022-10-26 DIAGNOSIS — Z87891 Personal history of nicotine dependence: Secondary | ICD-10-CM

## 2022-10-26 DIAGNOSIS — E785 Hyperlipidemia, unspecified: Secondary | ICD-10-CM | POA: Diagnosis not present

## 2022-10-26 DIAGNOSIS — Z86010 Personal history of colonic polyps: Secondary | ICD-10-CM

## 2022-10-26 DIAGNOSIS — R531 Weakness: Secondary | ICD-10-CM | POA: Diagnosis not present

## 2022-10-26 DIAGNOSIS — Z7901 Long term (current) use of anticoagulants: Secondary | ICD-10-CM

## 2022-10-26 DIAGNOSIS — Z8249 Family history of ischemic heart disease and other diseases of the circulatory system: Secondary | ICD-10-CM

## 2022-10-26 DIAGNOSIS — Z7951 Long term (current) use of inhaled steroids: Secondary | ICD-10-CM

## 2022-10-26 LAB — URINALYSIS, ROUTINE W REFLEX MICROSCOPIC
Bacteria, UA: NONE SEEN
Bilirubin Urine: NEGATIVE
Glucose, UA: >=500 mg/dL — AB
Hgb urine dipstick: NEGATIVE
Ketones, ur: NEGATIVE mg/dL
Leukocytes,Ua: NEGATIVE
Nitrite: NEGATIVE
Protein, ur: NEGATIVE mg/dL
Specific Gravity, Urine: 1.011 (ref 1.005–1.030)
pH: 5 (ref 5.0–8.0)

## 2022-10-26 LAB — BASIC METABOLIC PANEL
Anion gap: 12 (ref 5–15)
BUN: 15 mg/dL (ref 8–23)
CO2: 28 mmol/L (ref 22–32)
Calcium: 9.1 mg/dL (ref 8.9–10.3)
Chloride: 99 mmol/L (ref 98–111)
Creatinine, Ser: 0.93 mg/dL (ref 0.61–1.24)
GFR, Estimated: >60 mL/min (ref >60)
Glucose, Bld: 106 mg/dL — ABNORMAL HIGH (ref 70–99)
Potassium: 3.6 mmol/L (ref 3.5–5.1)
Sodium: 139 mmol/L (ref 135–145)

## 2022-10-26 LAB — CBC
HCT: 47 % (ref 39.0–52.0)
Hemoglobin: 14.6 g/dL (ref 13.0–17.0)
MCH: 28.9 pg (ref 26.0–34.0)
MCHC: 31.1 g/dL (ref 30.0–36.0)
MCV: 93.1 fL (ref 80.0–100.0)
Platelets: 251 10*3/uL (ref 150–400)
RBC: 5.05 MIL/uL (ref 4.22–5.81)
RDW: 15.3 % (ref 11.5–15.5)
WBC: 13.4 10*3/uL — ABNORMAL HIGH (ref 4.0–10.5)
nRBC: 0 % (ref 0.0–0.2)

## 2022-10-26 LAB — TROPONIN I (HIGH SENSITIVITY)
Troponin I (High Sensitivity): 7 ng/L (ref <18)
Troponin I (High Sensitivity): 3 ng/L (ref <18)

## 2022-10-26 LAB — RESP PANEL BY RT-PCR (RSV, FLU A&B, COVID)  RVPGX2
Influenza A by PCR: NEGATIVE
Influenza B by PCR: NEGATIVE
Resp Syncytial Virus by PCR: NEGATIVE
SARS Coronavirus 2 by RT PCR: NEGATIVE

## 2022-10-26 LAB — BRAIN NATRIURETIC PEPTIDE: B Natriuretic Peptide: 21.9 pg/mL (ref 0.0–100.0)

## 2022-10-26 LAB — CBG MONITORING, ED: Glucose-Capillary: 102 mg/dL — ABNORMAL HIGH (ref 70–99)

## 2022-10-26 MED ORDER — PANTOPRAZOLE SODIUM 40 MG PO TBEC
40.0000 mg | DELAYED_RELEASE_TABLET | Freq: Every day | ORAL | Status: DC
  Administered 2022-10-27 – 2022-10-31 (×5): 40 mg via ORAL
  Filled 2022-10-26 (×6): qty 1

## 2022-10-26 MED ORDER — ARIPIPRAZOLE 5 MG PO TABS
5.0000 mg | ORAL_TABLET | Freq: Every day | ORAL | Status: DC
  Administered 2022-10-26 – 2022-10-30 (×5): 5 mg via ORAL
  Filled 2022-10-26 (×5): qty 1

## 2022-10-26 MED ORDER — PREGABALIN 25 MG PO CAPS
150.0000 mg | ORAL_CAPSULE | Freq: Three times a day (TID) | ORAL | Status: DC
  Administered 2022-10-26 – 2022-10-31 (×14): 150 mg via ORAL
  Filled 2022-10-26 (×14): qty 2

## 2022-10-26 MED ORDER — INSULIN GLARGINE-YFGN 100 UNIT/ML ~~LOC~~ SOLN
20.0000 [IU] | Freq: Every day | SUBCUTANEOUS | Status: DC
  Administered 2022-10-26: 20 [IU] via SUBCUTANEOUS
  Filled 2022-10-26 (×2): qty 0.2

## 2022-10-26 MED ORDER — FLUTICASONE PROPIONATE 50 MCG/ACT NA SUSP
2.0000 | Freq: Every day | NASAL | Status: DC
  Administered 2022-10-27 – 2022-10-31 (×5): 2 via NASAL
  Filled 2022-10-26: qty 16

## 2022-10-26 MED ORDER — SODIUM CHLORIDE 0.9% FLUSH
3.0000 mL | Freq: Two times a day (BID) | INTRAVENOUS | Status: DC
  Administered 2022-10-26 – 2022-10-31 (×10): 3 mL via INTRAVENOUS

## 2022-10-26 MED ORDER — DILTIAZEM HCL ER COATED BEADS 120 MG PO CP24
120.0000 mg | ORAL_CAPSULE | Freq: Every day | ORAL | Status: DC
  Administered 2022-10-27 – 2022-10-31 (×5): 120 mg via ORAL
  Filled 2022-10-26 (×5): qty 1

## 2022-10-26 MED ORDER — GUAIFENESIN ER 600 MG PO TB12
1200.0000 mg | ORAL_TABLET | Freq: Two times a day (BID) | ORAL | Status: DC
  Administered 2022-10-26 – 2022-10-31 (×10): 1200 mg via ORAL
  Filled 2022-10-26 (×10): qty 2

## 2022-10-26 MED ORDER — LACOSAMIDE 50 MG PO TABS
150.0000 mg | ORAL_TABLET | Freq: Two times a day (BID) | ORAL | Status: DC
  Administered 2022-10-26 – 2022-10-31 (×10): 150 mg via ORAL
  Filled 2022-10-26 (×10): qty 3

## 2022-10-26 MED ORDER — APIXABAN 5 MG PO TABS
5.0000 mg | ORAL_TABLET | Freq: Two times a day (BID) | ORAL | Status: DC
  Administered 2022-10-26 – 2022-10-31 (×10): 5 mg via ORAL
  Filled 2022-10-26 (×10): qty 1

## 2022-10-26 MED ORDER — POTASSIUM CHLORIDE CRYS ER 20 MEQ PO TBCR
40.0000 meq | EXTENDED_RELEASE_TABLET | Freq: Two times a day (BID) | ORAL | Status: DC
  Administered 2022-10-26 – 2022-10-28 (×4): 40 meq via ORAL
  Filled 2022-10-26 (×5): qty 2

## 2022-10-26 MED ORDER — CEPHALEXIN 250 MG PO CAPS
500.0000 mg | ORAL_CAPSULE | Freq: Four times a day (QID) | ORAL | Status: DC
  Administered 2022-10-26 – 2022-10-27 (×3): 500 mg via ORAL
  Filled 2022-10-26 (×3): qty 2

## 2022-10-26 MED ORDER — ALBUTEROL SULFATE (2.5 MG/3ML) 0.083% IN NEBU: 3.0000 mL | INHALATION_SOLUTION | Freq: Four times a day (QID) | RESPIRATORY_TRACT | Status: DC | PRN

## 2022-10-26 MED ORDER — SODIUM CHLORIDE 0.9 % IV SOLN: 250.0000 mL | INTRAVENOUS | Status: DC | PRN

## 2022-10-26 MED ORDER — ACETAMINOPHEN 325 MG PO TABS: 650.0000 mg | ORAL_TABLET | ORAL | Status: DC | PRN

## 2022-10-26 MED ORDER — FERROUS SULFATE 325 (65 FE) MG PO TABS
324.0000 mg | ORAL_TABLET | Freq: Every day | ORAL | Status: DC
  Administered 2022-10-27 – 2022-10-31 (×5): 324 mg via ORAL
  Filled 2022-10-26 (×5): qty 1

## 2022-10-26 MED ORDER — HYDROCORTISONE 1 % EX OINT
TOPICAL_OINTMENT | Freq: Three times a day (TID) | CUTANEOUS | Status: DC
  Administered 2022-10-27: 1 via TOPICAL
  Filled 2022-10-26 (×2): qty 28

## 2022-10-26 MED ORDER — ICOSAPENT ETHYL 1 G PO CAPS
2.0000 g | ORAL_CAPSULE | Freq: Two times a day (BID) | ORAL | Status: DC
  Administered 2022-10-26 – 2022-10-31 (×10): 2 g via ORAL
  Filled 2022-10-26 (×11): qty 2

## 2022-10-26 MED ORDER — ATORVASTATIN CALCIUM 40 MG PO TABS
40.0000 mg | ORAL_TABLET | Freq: Every day | ORAL | Status: DC
  Administered 2022-10-27 – 2022-10-31 (×5): 40 mg via ORAL
  Filled 2022-10-26 (×5): qty 1

## 2022-10-26 MED ORDER — IOHEXOL 350 MG/ML SOLN
75.0000 mL | Freq: Once | INTRAVENOUS | Status: AC | PRN
  Administered 2022-10-26: 75 mL via INTRAVENOUS

## 2022-10-26 MED ORDER — FLUTICASONE FUROATE-VILANTEROL 200-25 MCG/ACT IN AEPB
1.0000 | INHALATION_SPRAY | Freq: Every day | RESPIRATORY_TRACT | Status: DC
  Administered 2022-10-27 – 2022-10-31 (×5): 1 via RESPIRATORY_TRACT
  Filled 2022-10-26 (×2): qty 28

## 2022-10-26 MED ORDER — VENLAFAXINE HCL ER 75 MG PO CP24
225.0000 mg | ORAL_CAPSULE | Freq: Every day | ORAL | Status: DC
  Administered 2022-10-27 – 2022-10-31 (×5): 225 mg via ORAL
  Filled 2022-10-26 (×5): qty 3

## 2022-10-26 MED ORDER — ONDANSETRON HCL 4 MG/2ML IJ SOLN: 4.0000 mg | Freq: Four times a day (QID) | INTRAMUSCULAR | Status: DC | PRN

## 2022-10-26 MED ORDER — BUSPIRONE HCL 10 MG PO TABS
10.0000 mg | ORAL_TABLET | Freq: Three times a day (TID) | ORAL | Status: DC
  Administered 2022-10-26 – 2022-10-31 (×14): 10 mg via ORAL
  Filled 2022-10-26 (×14): qty 1

## 2022-10-26 MED ORDER — LEVETIRACETAM 500 MG PO TABS
500.0000 mg | ORAL_TABLET | Freq: Two times a day (BID) | ORAL | Status: DC
  Administered 2022-10-26 – 2022-10-31 (×10): 500 mg via ORAL
  Filled 2022-10-26 (×10): qty 1

## 2022-10-26 MED ORDER — FUROSEMIDE 10 MG/ML IJ SOLN
40.0000 mg | Freq: Two times a day (BID) | INTRAMUSCULAR | Status: DC
  Administered 2022-10-27: 40 mg via INTRAVENOUS
  Filled 2022-10-26: qty 4

## 2022-10-26 MED ORDER — ALLOPURINOL 100 MG PO TABS
200.0000 mg | ORAL_TABLET | Freq: Every day | ORAL | Status: DC
  Administered 2022-10-27: 200 mg via ORAL
  Filled 2022-10-26: qty 2

## 2022-10-26 MED ORDER — MIRTAZAPINE 15 MG PO TABS
15.0000 mg | ORAL_TABLET | Freq: Every day | ORAL | Status: DC
  Administered 2022-10-26 – 2022-10-30 (×5): 15 mg via ORAL
  Filled 2022-10-26 (×5): qty 1

## 2022-10-26 MED ORDER — LORATADINE 10 MG PO TABS
10.0000 mg | ORAL_TABLET | Freq: Every day | ORAL | Status: DC
  Administered 2022-10-27 – 2022-10-31 (×5): 10 mg via ORAL
  Filled 2022-10-26 (×5): qty 1

## 2022-10-26 MED ORDER — SPIRONOLACTONE 12.5 MG HALF TABLET
12.5000 mg | ORAL_TABLET | Freq: Every day | ORAL | Status: DC
  Administered 2022-10-27 – 2022-10-31 (×5): 12.5 mg via ORAL
  Filled 2022-10-26 (×5): qty 1

## 2022-10-26 MED ORDER — SODIUM CHLORIDE 0.9% FLUSH: 3.0000 mL | INTRAVENOUS | Status: DC | PRN

## 2022-10-26 MED ORDER — ISOSORBIDE MONONITRATE ER 30 MG PO TB24
30.0000 mg | ORAL_TABLET | Freq: Every day | ORAL | Status: DC
  Administered 2022-10-27 – 2022-10-31 (×5): 30 mg via ORAL
  Filled 2022-10-26 (×5): qty 1

## 2022-10-26 MED ORDER — FENOFIBRATE 160 MG PO TABS
160.0000 mg | ORAL_TABLET | Freq: Every day | ORAL | Status: DC
  Administered 2022-10-27 – 2022-10-31 (×5): 160 mg via ORAL
  Filled 2022-10-26 (×5): qty 1

## 2022-10-26 MED ORDER — METOPROLOL SUCCINATE ER 25 MG PO TB24
25.0000 mg | ORAL_TABLET | Freq: Every day | ORAL | Status: DC
  Administered 2022-10-27 – 2022-10-31 (×5): 25 mg via ORAL
  Filled 2022-10-26 (×5): qty 1

## 2022-10-26 MED ORDER — INSULIN ASPART 100 UNIT/ML IJ SOLN
0.0000 [IU] | Freq: Three times a day (TID) | INTRAMUSCULAR | Status: DC
  Administered 2022-10-27: 3 [IU] via SUBCUTANEOUS
  Administered 2022-10-27 – 2022-10-28 (×3): 2 [IU] via SUBCUTANEOUS
  Administered 2022-10-28 – 2022-10-29 (×2): 3 [IU] via SUBCUTANEOUS
  Administered 2022-10-29: 2 [IU] via SUBCUTANEOUS
  Administered 2022-10-30: 5 [IU] via SUBCUTANEOUS
  Administered 2022-10-30: 3 [IU] via SUBCUTANEOUS
  Administered 2022-10-31 (×2): 2 [IU] via SUBCUTANEOUS

## 2022-10-26 MED ORDER — PRAZOSIN HCL 2 MG PO CAPS
4.0000 mg | ORAL_CAPSULE | Freq: Every day | ORAL | Status: DC
  Administered 2022-10-26 – 2022-10-30 (×5): 4 mg via ORAL
  Filled 2022-10-26 (×6): qty 2

## 2022-10-26 MED ORDER — ACETAMINOPHEN 325 MG PO TABS
650.0000 mg | ORAL_TABLET | ORAL | Status: DC | PRN
  Administered 2022-10-26 – 2022-10-31 (×12): 650 mg via ORAL
  Filled 2022-10-26 (×12): qty 2

## 2022-10-26 MED ORDER — FUROSEMIDE 10 MG/ML IJ SOLN
40.0000 mg | Freq: Once | INTRAMUSCULAR | Status: AC
  Administered 2022-10-26: 40 mg via INTRAVENOUS
  Filled 2022-10-26: qty 4

## 2022-10-26 MED ORDER — UMECLIDINIUM BROMIDE 62.5 MCG/ACT IN AEPB
1.0000 | INHALATION_SPRAY | Freq: Every day | RESPIRATORY_TRACT | Status: DC
  Administered 2022-10-27 – 2022-10-31 (×5): 1 via RESPIRATORY_TRACT
  Filled 2022-10-26: qty 7

## 2022-10-26 NOTE — ED Provider Notes (Signed)
[facility] Provider Note   CSN: Lawrence [name]:[id number] Arrival date & time: 10/26/22  1242     History Chief Complaint  Patient presents with   Weakness    HPI Lawrence [name] is a 67 y.o. male presenting for shortness of breath.  He is a 67 year old male with an extensive medical history.  History of obesity, hypertension, diabetes, heart failure, A-fib, COPD, CVA.  States that he has had worsening shortness of breath throughout the day today. Desatting to 88% with EMS started on 2 L nasal cannula.  Endorses bilateral lower extremity swelling.  Believes he has been compliant with all of his medication.   Patient's recorded medical, surgical, social, medication list and allergies were reviewed in the Snapshot window as part of the initial history.   Review of Systems   Review of Systems  Constitutional:  Positive for fatigue. Negative for chills and fever.  HENT:  Negative for ear pain and sore throat.   Eyes:  Negative for pain and visual disturbance.  Respiratory:  Positive for shortness of breath. Negative for cough.   Cardiovascular:  Positive for chest pain. Negative for palpitations.  Gastrointestinal:  Negative for abdominal pain and vomiting.  Genitourinary:  Negative for dysuria and hematuria.  Musculoskeletal:  Negative for arthralgias and back pain.  Skin:  Negative for color change and rash.  Neurological:  Negative for seizures and syncope.  All other systems reviewed and are negative.   Physical Exam Updated Vital Signs BP 114/82   Pulse (!) 104   Temp 97.7 F (36.5 C) (Oral)   Resp 15   SpO2 94%  Physical Exam Vitals and nursing note reviewed.  Constitutional:      General: He is not in acute distress.    Appearance: He is well-developed.  HENT:     Head: Normocephalic and atraumatic.  Eyes:     Conjunctiva/sclera: Conjunctivae normal.  Cardiovascular:     Rate and Rhythm: Normal rate and regular rhythm.      Heart sounds: No murmur heard. Pulmonary:     Effort: Respiratory distress present.     Breath sounds: Rhonchi present.  Abdominal:     Palpations: Abdomen is soft.     Tenderness: There is no abdominal tenderness.  Musculoskeletal:        General: No swelling.     Cervical back: Neck supple.     Right lower leg: Edema present.     Left lower leg: Edema present.  Skin:    General: Skin is warm and dry.     Capillary Refill: Capillary refill takes less than 2 seconds.  Neurological:     Mental Status: He is alert.  Psychiatric:        Mood and Affect: Mood normal.      ED Course/ Medical Decision Making/ A&P    Procedures Procedures   Medications Ordered in ED Medications - No data to display  Medical Decision Making:    Lawrence [name] is a 67 y.o. male who presented to the ED today with shortness of breath detailed above.     Complete initial physical exam performed, notably the patient  was tachypneic and hypoxic.  Satting 80% on room air had to be titrated on oxygen.  Stabilized on 2 years nasal cannula.  Does have bilateral lower extremity edema on exam..      Reviewed and confirmed nursing documentation for past medical history, family history, social history.  Initial Assessment:   With the patient's presentation of shortness of breath, most likely diagnosis is nonspecific etiology.  I am concerned for developing pneumonia pneumothorax, pulmonary embolism.  Clinically he appears to be a heart failure exacerbation but historically, it has been secondary to alternative etiology. Other diagnoses were considered including (but not limited to) COPD new diagnosis. These are considered less likely due to history of present illness and physical exam findings.   This is most consistent with an acute life/limb threatening illness complicated by underlying chronic conditions.  Initial Plan:  Chest x-ray and BNP were ordered to evaluate for intrathoracic pathology including  heart failure exacerbation.  Both results with no focal pathology identified.  Will broaden to include CT angiography of the chest to rule out pulmonary embolism or other intrathoracic pathology Screening labs including CBC and Metabolic panel to evaluate for infectious or metabolic etiology of disease.  Serial troponin/EKG to evaluate for cardiac pathology. Objective evaluation as below reviewed with plan for close reassessment  Initial Study Results:   Laboratory  All laboratory results reviewed without evidence of clinically relevant pathology.   Exception includes leukocytosis  EKG EKG was reviewed independently. Rate, rhythm, axis, intervals all examined and without medically relevant abnormality. ST segments without concerns for elevations.    Radiology  All images reviewed independently. Agree with radiology report at this time.   DG Chest Portable 1 View  Result Date: 10/26/2022 CLINICAL DATA:  Shortness of breath. EXAM: PORTABLE CHEST 1 VIEW COMPARISON:  March 09, 2022. FINDINGS: Stable cardiomediastinal silhouette. Minimal bibasilar subsegmental atelectasis is noted. Bony thorax is unremarkable. IMPRESSION: Minimal bibasilar subsegmental atelectasis. Electronically Signed   By: Marijo Conception M.D.   On: 10/26/2022 13:37      Final Assessment and Plan:   Initial evaluation does not reveal an obvious etiology of patient's dyspnea and new hypoxia.  Will broaden evaluation as above to include evaluation for pulmonary embolism.  He does have right lower extremity swelling this all may be secondary to cellulitis though new oxygen requirement seems to favor a pulmonary pathology.  Pending PE study results at time of handoff to oncoming provider see their note for ultimate disposition.   Clinical Impression:  1. Hypoxia   2. SOB (shortness of breath)      Data Unavailable   Final Clinical Impression(s) / ED Diagnoses Final diagnoses:  Hypoxia  SOB (shortness of breath)    Rx /  DC Orders ED Discharge Orders     None         Tretha Sciara, MD 10/26/22 1512

## 2022-10-26 NOTE — Progress Notes (Signed)
   10/26/22 2143  Vitals  Temp 98.3 F (36.8 C)  Temp Source Oral  BP 136/80  MAP (mmHg) 94  BP Location Right Wrist  BP Method Automatic  Patient Position (if appropriate) Sitting  Pulse Rate 85  ECG Heart Rate 85  Resp 19  Level of Consciousness  Level of Consciousness Alert  MEWS COLOR  MEWS Score Color Green  Oxygen Therapy  SpO2 93 %  O2 Device Room Air  Height and Weight  Height 6\' 2"  (1.88 m)  Weight (!) 163.8 kg  Type of Scale Used Standing (scale a)  Type of Weight Actual  BSA (Calculated - sq m) 2.92 sq meters  BMI (Calculated) 46.34  Weight in (lb) to have BMI = 25 194.3  MEWS Score  MEWS Temp 0  MEWS Systolic 0  MEWS Pulse 0  MEWS RR 0  MEWS LOC 0  MEWS Score 0   Admitted pt to rm 3E10 from ED, oriented to room, call bell placed within reach, placed on cardiac monitor, CCMD made aware.

## 2022-10-26 NOTE — ED Provider Notes (Signed)
  Physical Exam  BP 126/86   Pulse 81   Temp 97.7 F (36.5 C) (Oral)   Resp 15   SpO2 93%   Physical Exam Vitals and nursing note reviewed.  Constitutional:      General: He is not in acute distress.    Appearance: He is well-developed.  HENT:     Head: Normocephalic and atraumatic.  Eyes:     Conjunctiva/sclera: Conjunctivae normal.  Cardiovascular:     Rate and Rhythm: Normal rate and regular rhythm.     Heart sounds: No murmur heard. Pulmonary:     Effort: Pulmonary effort is normal. No respiratory distress.     Breath sounds: Normal breath sounds.  Abdominal:     Palpations: Abdomen is soft.     Tenderness: There is no abdominal tenderness.  Musculoskeletal:        General: No swelling.     Cervical back: Neck supple.     Right lower leg: Edema present.     Left lower leg: Edema present.  Skin:    General: Skin is warm and dry.     Capillary Refill: Capillary refill takes less than 2 seconds.  Neurological:     Mental Status: He is alert.  Psychiatric:        Mood and Affect: Mood normal.     Procedures  .Critical Care  Performed by: Teressa Lower, MD Authorized by: Teressa Lower, MD   Critical care provider statement:    Critical care time (minutes):  30   Critical care was necessary to treat or prevent imminent or life-threatening deterioration of the following conditions:  Respiratory failure   Critical care was time spent personally by me on the following activities:  Development of treatment plan with patient or surrogate, discussions with consultants, evaluation of patient's response to treatment, examination of patient, ordering and review of laboratory studies, ordering and review of radiographic studies, ordering and performing treatments and interventions, pulse oximetry, re-evaluation of patient's condition and review of old charts   ED Course / MDM    Medical Decision Making Amount and/or Complexity of Data Reviewed Labs:  ordered. Radiology: ordered.  Risk Prescription drug management.   Patient received in handoff.  Concern for CHF exacerbation but normal BNP.  New oxygen requirement pending PE study.  PE study reassuringly negative but does show evidence of CHF.  Patient 80% on room air here in the emergency department and oxygen saturations improved with 2 L nasal cannula.  Diuresis begun and patient will require hospital admission       Teressa Lower, MD 10/26/22 1626

## 2022-10-26 NOTE — ED Notes (Signed)
Ok per ED provider for patient to [facility]. Patient provided with [place] sandwich and water

## 2022-10-26 NOTE — ED Notes (Signed)
ED TO INPATIENT HANDOFF REPORT  ED Nurse Name and Phone #: Richardson Landry [phone]  S Name/Age/Gender Lawrence [name] 67 y.o. male Room/Bed: [id number]  Code Status   Code Status: DNR  Home/SNF/Other Home Patient oriented to: self, place, time, and situation Is this baseline? Yes   Triage Complete: Triage complete  Chief Complaint CHF (congestive heart failure) ([place]) [I50.9]  Triage Note Pt BIBGEMS for weakness. Pt reports tingling to the left that has since subsided. Bilateral lower extremity edema. Pt reports SOB x1 week.  134/92 77 hr 153 cbg 18 LAC  Pt able to stand and pivot to stretcher with assistance.  Hx stroke no deficits   Allergies Allergies  Allergen Reactions   Vancomycin Other (See Comments)    "Red Man Syndrome" 02/02/17: possible cause for rash under both arms   Niacin Other (See Comments)   Niacin And Related Other (See Comments)    Red man syndrome   Tubersol [Tuberculin, Ppd] Other (See Comments)    Reaction unknown   Doxycycline Rash and Other (See Comments)    Level of Care/Admitting Diagnosis ED Disposition     ED Disposition  Admit   Condition  --   [place]: [place] [[id number]]  Level of Care: Telemetry Medical [104]  May place patient in observation at [facility] or [place] if equivalent level of care is available:: No  Covid Evaluation: Asymptomatic - no recent exposure (last 10 days) testing not required  Diagnosis: CHF (congestive heart failure) [facility]) [id number]  Admitting Physician: Lequita Halt [id number]  Attending Physician: Lequita Halt [id number]          B Medical/Surgery History Past Medical History:  Diagnosis Date   Acquired dilation of ascending aorta and aortic root ([place])    59mm by echo 01/2021   Adenomatous colon polyp 2007   Anemia    Anxiety    Aortic stenosis    moderate AS by echo 01/2021   Asthma    BPH without obstruction/lower urinary tract symptoms 02/22/2017    Chronic diastolic (congestive) heart failure (HCC)    Chronic venous stasis 03/07/2019   COPD (chronic obstructive pulmonary disease) (HCC)    Coronary artery calcification seen on CAT scan    Depression    Diabetic neuropathy ([place]) 09/11/2019   History of colon polyps 08/24/2018   Hypertension    Morbid obesity (HCC)    OSA (obstructive sleep apnea)    Pain due to onychomycosis of toenails of both feet 09/11/2019   Peripheral neuropathy 02/22/2017   Primary osteoarthritis, left shoulder 03/05/2017   PTSD (post-traumatic stress disorder)    Pure hypercholesterolemia    QT prolongation 03/07/2019   Seizures (HCC)    Shortness of breath    Sinus tachycardia 03/07/2019   Sleep apnea    CPAP   Type 2 diabetes mellitus with vascular disease ([place]) 09/11/2019   Past Surgical History:  Procedure Laterality Date   ENDOVENOUS ABLATION SAPHENOUS VEIN W/ LASER Right 08/20/2020   endovenous laser ablation right greater saphenous vein by Gae Gallop MD    JOINT REPLACEMENT     left knee replacement x 2   KNEE ARTHROSCOPY Bilateral    LEFT HEART CATH AND CORONARY ANGIOGRAPHY N/A 01/17/2018   Procedure: LEFT HEART CATH AND CORONARY ANGIOGRAPHY;  Surgeon: Leonie Man, MD;  Location: [place] [facility];  Service: Cardiovascular;  Laterality: N/A;   PRESSURE SENSOR/CARDIOMEMS N/A 08/26/2021   Procedure: PRESSURE SENSOR/CARDIOMEMS;  Surgeon: Aundra Dubin,  Elby Showers, MD;  Location: [place] [facility];  Service: Cardiovascular;  Laterality: N/A;   RIGHT HEART CATH N/A 08/26/2021   Procedure: RIGHT HEART CATH;  Surgeon: Larey Dresser, MD;  Location: [place] [facility];  Service: Cardiovascular;  Laterality: N/A;   TEE WITHOUT CARDIOVERSION N/A 08/26/2021   Procedure: TRANSESOPHAGEAL ECHOCARDIOGRAM (TEE);  Surgeon: Larey Dresser, MD;  Location: [place];  Service: Cardiovascular;  Laterality: N/A;   UMBILICAL HERNIA REPAIR       A IV Location/Drains/Wounds Patient Lines/Drains/Airways Status      Active Line/Drains/Airways     Name Placement date Placement time Site Days   Peripheral IV 10/26/22 20 G Anterior;Distal;Left;Upper Arm 10/26/22  1447  Arm  less than 1            Intake/Output Last 24 hours  Intake/Output Summary (Last 24 hours) at 10/26/2022 1858 Last data filed at 10/26/2022 1818 Gross per 24 hour  Intake --  Output 1075 ml  Net -1075 ml    Labs/Imaging Results for orders placed or performed during the hospital encounter of 10/26/22 (from the past 48 hour(s))  Urinalysis, Routine w reflex microscopic -Urine, Random     Status: Abnormal   Collection Time: 10/26/22 12:47 PM  Result Value Ref Range   Color, Urine YELLOW YELLOW   APPearance CLEAR CLEAR   Specific Gravity, Urine 1.011 1.005 - 1.030   pH 5.0 5.0 - 8.0   Glucose, UA >=500 (A) NEGATIVE mg/dL   Hgb urine dipstick NEGATIVE NEGATIVE   Bilirubin Urine NEGATIVE NEGATIVE   Ketones, ur NEGATIVE NEGATIVE mg/dL   Protein, ur NEGATIVE NEGATIVE mg/dL   Nitrite NEGATIVE NEGATIVE   Leukocytes,Ua NEGATIVE NEGATIVE   RBC / HPF 0-5 0 - 5 RBC/hpf   WBC, UA 0-5 0 - 5 WBC/hpf   Bacteria, UA NONE SEEN NONE SEEN   Squamous Epithelial / HPF 0-5 0 - 5 /HPF   Mucus PRESENT     Comment: Performed at [facility], [address] [place], [place] [id number]  Basic metabolic panel     Status: Abnormal   Collection Time: 10/26/22 12:50 PM  Result Value Ref Range   Sodium 139 135 - 145 mmol/L   Potassium 3.6 3.5 - 5.1 mmol/L   Chloride 99 98 - 111 mmol/L   CO2 28 22 - 32 mmol/L   Glucose, Bld 106 (H) 70 - 99 mg/dL    Comment: Glucose reference range applies only to samples taken after fasting for at least 8 hours.   BUN 15 8 - 23 mg/dL   Creatinine, Ser 0.93 0.61 - 1.24 mg/dL   Calcium 9.1 8.9 - 10.3 mg/dL   GFR, Estimated >60 >60 mL/min    Comment: (NOTE) Calculated using the CKD-EPI Creatinine Equation (2021)    Anion gap 12 5 - 15    Comment: Performed at [facility] [address], [place], [place] [address]  CBC     Status: Abnormal   Collection Time: 10/26/22 12:50 PM  Result Value Ref Range   WBC 13.4 (H) 4.0 - 10.5 K/uL   RBC 5.05 4.22 - 5.81 MIL/uL   Hemoglobin 14.6 13.0 - 17.0 g/dL   HCT 47.0 39.0 - 52.0 %   MCV 93.1 80.0 - 100.0 fL   MCH 28.9 26.0 - 34.0 pg   MCHC 31.1 30.0 - 36.0 g/dL   RDW 15.3 11.5 - 15.5 %   Platelets 251 150 - 400 K/uL  nRBC 0.0 0.0 - 0.2 %    Comment: Performed at [facility], [place] [address], [place], [place] [address]  Troponin I (High Sensitivity)     Status: None   Collection Time: 10/26/22 12:50 PM  Result Value Ref Range   Troponin I (High Sensitivity) 7 <18 ng/L    Comment: (NOTE) Elevated high sensitivity troponin I (hsTnI) values and significant  changes across serial measurements may suggest ACS but many other  chronic and acute conditions are known to elevate hsTnI results.  Refer to the "Links" section for chest pain algorithms and additional  guidance. Performed at [facility], [place] [address], [place], [place] [address]   CBG monitoring, ED     Status: Abnormal   Collection Time: 10/26/22 12:52 PM  Result Value Ref Range   Glucose-Capillary 102 (H) 70 - 99 mg/dL    Comment: Glucose reference range applies only to samples taken after fasting for at least 8 hours.  Brain natriuretic peptide     Status: None   Collection Time: 10/26/22 12:57 PM  Result Value Ref Range   B Natriuretic Peptide 21.9 0.0 - 100.0 pg/mL    Comment: Performed at [facility], [place] [address] [place], [place] [address]  Resp panel by RT-PCR (RSV, Flu A&B, Covid) Anterior Nasal Swab     Status: None   Collection Time: 10/26/22 12:57 PM   Specimen: Anterior Nasal Swab  Result Value Ref Range   SARS Coronavirus 2 by RT PCR NEGATIVE NEGATIVE   Influenza A by PCR NEGATIVE NEGATIVE   Influenza B by PCR NEGATIVE NEGATIVE    Comment: (NOTE) The Xpert Xpress SARS-CoV-2/FLU/RSV plus assay is intended as an aid in the  diagnosis of influenza from Nasopharyngeal swab specimens and should not be used as a sole basis for treatment. Nasal washings and aspirates are unacceptable for Xpert Xpress SARS-CoV-2/FLU/RSV testing.  Fact Sheet for Patients: EntrepreneurPulse.com.au  Fact Sheet for Healthcare Providers: IncredibleEmployment.be  This test is not yet approved or cleared by the [place] FDA and has been authorized for detection and/or diagnosis of SARS-CoV-2 by FDA under an Emergency Use Authorization (EUA). This EUA will remain in effect (meaning this test can be used) for the duration of the COVID-19 declaration under Section 564(b)(1) of the Act, 21 U.S.C. section 360bbb-3(b)(1), unless the authorization is terminated or revoked.     Resp Syncytial Virus by PCR NEGATIVE NEGATIVE    Comment: (NOTE) Fact Sheet for Patients: EntrepreneurPulse.com.au  Fact Sheet for Healthcare Providers: IncredibleEmployment.be  This test is not yet approved or cleared by the [place] FDA and has been authorized for detection and/or diagnosis of SARS-CoV-2 by FDA under an Emergency Use Authorization (EUA). This EUA will remain in effect (meaning this test can be used) for the duration of the COVID-19 declaration under Section 564(b)(1) of the Act, 21 U.S.C. section 360bbb-3(b)(1), unless the authorization is terminated or revoked.  Performed at [facility], [place] [address], [place], [place] [address]   Troponin I (High Sensitivity)     Status: None   Collection Time: 10/26/22  2:57 PM  Result Value Ref Range   Troponin I (High Sensitivity) 3 <18 ng/L    Comment: (NOTE) Elevated high sensitivity troponin I (hsTnI) values and significant  changes across serial measurements may suggest ACS but many other  chronic and acute conditions are known to elevate hsTnI results.  Refer to the "Links" section for chest pain  algorithms  and additional  guidance. Performed at [facility], [place] [address] [place], [place] [address]    CT Angio Chest Pulmonary Embolism (PE) W or WO Contrast  Result Date: 10/26/2022 CLINICAL DATA:  PE EXAM: CT ANGIOGRAPHY CHEST WITH CONTRAST TECHNIQUE: Multidetector CT imaging of the chest was performed using the standard protocol during bolus administration of intravenous contrast. Multiplanar CT image reconstructions and MIPs were obtained to evaluate the vascular anatomy. RADIATION DOSE REDUCTION: This exam was performed according to the departmental dose-optimization program which includes automated exposure control, adjustment of the mA and/or kV according to patient size and/or use of iterative reconstruction technique. CONTRAST:  27mL OMNIPAQUE IOHEXOL 350 MG/ML SOLN COMPARISON:  09/09/2019. FINDINGS: Cardiovascular: Enlarged heart. Atheromatous changes aorta and coronary arteries. No filling defects in pulmonary arteries to indicate PE. No aortic aneurysm. No pericardial effusion. There is some reflux of contrast in the IVC consistent with tricuspid insufficiency. Mediastinum/Nodes: No enlarged mediastinal, hilar, or axillary lymph nodes. Thyroid gland, trachea, and esophagus demonstrate no significant findings. Lungs/Pleura: Small bilateral pleural effusions. Ground-glass opacities in both lungs especially in the dependent portions consistent with pneumonitis atelectasis or pulmonary edema. No pneumothorax. Trace pleural effusions. Upper Abdomen: No acute process identified. There is a partially imaged hypodensity in the liver presumably cysts measuring at least 3.7 cm. Musculoskeletal: Thoracic degenerative changes identified. Review of the MIP images confirms the above findings. IMPRESSION: 1. No evidence of PE. 2. Findings are consistent with CHF. Electronically Signed   By: Sammie Bench M.D.   On: 10/26/2022 16:19   DG Chest Portable 1 View  Result Date:  10/26/2022 CLINICAL DATA:  Shortness of breath. EXAM: PORTABLE CHEST 1 VIEW COMPARISON:  March 09, 2022. FINDINGS: Stable cardiomediastinal silhouette. Minimal bibasilar subsegmental atelectasis is noted. Bony thorax is unremarkable. IMPRESSION: Minimal bibasilar subsegmental atelectasis. Electronically Signed   By: Marijo Conception M.D.   On: 10/26/2022 13:37    Pending Labs Unresulted Labs (From admission, onward)     Start     Ordered   10/27/22 [id number]  Basic metabolic panel  Daily,   R     Comments: As Scheduled for 5 days    10/26/22 1811   10/26/22 1809  HIV Antibody (routine testing w rflx)  (HIV Antibody (Routine testing w reflex) panel)  Once,   R        10/26/22 1811            Vitals/Pain Today's Vitals   10/26/22 1430 10/26/22 1500 10/26/22 1630 10/26/22 1646  BP: 114/82 126/86 127/84   Pulse: (!) 104 81 82   Resp: 15 15 16    Temp:    97.7 F (36.5 C)  TempSrc:    Oral  SpO2: 94% 93% 95%   PainSc:        Isolation Precautions No active isolations  Medications Medications  allopurinol (ZYLOPRIM) tablet 200 mg (has no administration in time range)  atorvastatin (LIPITOR) tablet 40 mg (has no administration in time range)  diltiazem (CARDIZEM CD) 24 hr capsule 120 mg (has no administration in time range)  fenofibrate tablet 160 mg (has no administration in time range)  isosorbide mononitrate (IMDUR) 24 hr tablet 30 mg (has no administration in time range)  metoprolol succinate (TOPROL-XL) 24 hr tablet 25 mg (has no administration in time range)  prazosin (MINIPRESS) capsule 4 mg (has no administration in time range)  spironolactone (ALDACTONE) tablet 12.5 mg (has no administration in time range)  icosapent Ethyl (VASCEPA) 1 g  capsule 2 g (has no administration in time range)  ARIPiprazole (ABILIFY) tablet 5 mg (has no administration in time range)  busPIRone (BUSPAR) tablet 10 mg (has no administration in time range)  mirtazapine (REMERON) tablet 30 mg (has no  administration in time range)  venlafaxine XR (EFFEXOR-XR) 24 hr capsule 225 mg (has no administration in time range)  insulin degludec (TRESIBA) 100 UNIT/ML FlexTouch Pen 20 Units (has no administration in time range)  pantoprazole (PROTONIX) EC tablet 40 mg (has no administration in time range)  apixaban (ELIQUIS) tablet 5 mg (has no administration in time range)  ferrous sulfate TBEC 324 mg (has no administration in time range)  Lacosamide TABS 150 mg (has no administration in time range)  levETIRAcetam (KEPPRA) tablet 500 mg (has no administration in time range)  pregabalin (LYRICA) capsule 150 mg (150 mg Oral Given 10/26/22 1839)  potassium chloride SA (KLOR-CON M) CR tablet 40 mEq (has no administration in time range)  albuterol (PROVENTIL) (2.5 MG/3ML) 0.083% nebulizer solution 3 mL (has no administration in time range)  loratadine (CLARITIN) tablet 10 mg (has no administration in time range)  fluticasone (FLONASE) 50 MCG/ACT nasal spray 2 spray (has no administration in time range)  fluticasone furoate-vilanterol (BREO ELLIPTA) 200-25 MCG/ACT 1 puff (has no administration in time range)    And  umeclidinium bromide (INCRUSE ELLIPTA) 62.5 MCG/ACT 1 puff (has no administration in time range)  guaiFENesin (MUCINEX) 12 hr tablet 1,200 mg (has no administration in time range)  sodium chloride flush (NS) 0.9 % injection 3 mL (has no administration in time range)  sodium chloride flush (NS) 0.9 % injection 3 mL (has no administration in time range)  0.9 %  sodium chloride infusion (has no administration in time range)  acetaminophen (TYLENOL) tablet 650 mg (650 mg Oral Given 10/26/22 1839)  ondansetron (ZOFRAN) injection 4 mg (has no administration in time range)  furosemide (LASIX) injection 40 mg (has no administration in time range)  insulin aspart (novoLOG) injection 0-15 Units (has no administration in time range)  cephALEXin (KEFLEX) capsule 500 mg (has no administration in time range)   hydrocortisone 1 % ointment (has no administration in time range)  iohexol (OMNIPAQUE) 350 MG/ML injection 75 mL (75 mLs Intravenous Contrast Given 10/26/22 1607)  furosemide (LASIX) injection 40 mg (40 mg Intravenous Given 10/26/22 1634)    Mobility walks     Focused Assessments Pulmonary Assessment Handoff:  Lung sounds:          R Recommendations: See Admitting Provider Note  Report given to:   Additional Notes:

## 2022-10-26 NOTE — H&P (Addendum)
History and Physical    Lawrence [name] [id number] DOB: October 08, 1955 DOA: 10/26/2022  PCP: [name], PA-C (Confirm with patient/family/NH records and if not entered, this has to be entered at [facility] point of entry) Patient coming from: Home  I have personally briefly reviewed patient's old medical records in [place]  Chief Complaint: SOB, feeling weak  HPI: Lawrence [name] is a 67 y.o. male with medical history significant of chronic HFpEF, moderate AS, CVA with residual left-sided weakness, morbid obesity, OSA on CPAP, HTN, HLD, PAF on Eliquis, questionable seizure disorder on Keppra/Vimpat, COPD came with worsening of shortness of breath and generalized weakness.  Symptoms started 5 to 6 days ago, patient started to develop exertional dyspnea with productive cough with clear phlegm along with sharp-like chest pain located on the right side worsening by cough and upper body movement denies any fever or chills.  Meantime he also noticed increasing swelling of his legs and " skin still stretchy" and he started to feel itchy and started to scratch the skin in the mid several shallow wounds with bleeding.  Last night, patient could not lie flat due to significant shortness of breath and decided to come into the hospital this morning.  EMS arrived and found the patient O2 saturation 85% on room air.  Patient denied any palpitation sensations at home.  ED Course: Afebrile, blood pressure 130/90, heart rate 77 stabilized on 4 L oxygen.  CT angiogram negative for PE but pulmonary congestion.  Patient was given 40 mg IV Lasix in the ED.  WBC 13.  Review of Systems: As per HPI otherwise 14 point review of systems negative.    Past Medical History:  Diagnosis Date   Acquired dilation of ascending aorta and aortic root ([place])    77mm by echo 01/2021   Adenomatous colon polyp 2007   Anemia    Anxiety    Aortic stenosis    moderate AS by echo 01/2021   Asthma    BPH without  obstruction/lower urinary tract symptoms 02/22/2017   Chronic diastolic (congestive) heart failure (HCC)    Chronic venous stasis 03/07/2019   COPD (chronic obstructive pulmonary disease) (HCC)    Coronary artery calcification seen on CAT scan    Depression    Diabetic neuropathy ([place]) 09/11/2019   History of colon polyps 08/24/2018   Hypertension    Morbid obesity (HCC)    OSA (obstructive sleep apnea)    Pain due to onychomycosis of toenails of both feet 09/11/2019   Peripheral neuropathy 02/22/2017   Primary osteoarthritis, left shoulder 03/05/2017   PTSD (post-traumatic stress disorder)    Pure hypercholesterolemia    QT prolongation 03/07/2019   Seizures (HCC)    Shortness of breath    Sinus tachycardia 03/07/2019   Sleep apnea    CPAP   Type 2 diabetes mellitus with vascular disease ([place]) 09/11/2019    Past Surgical History:  Procedure Laterality Date   ENDOVENOUS ABLATION SAPHENOUS VEIN W/ LASER Right 08/20/2020   endovenous laser ablation right greater saphenous vein by Gae Gallop MD    JOINT REPLACEMENT     left knee replacement x 2   KNEE ARTHROSCOPY Bilateral    LEFT HEART CATH AND CORONARY ANGIOGRAPHY N/A 01/17/2018   Procedure: LEFT HEART CATH AND CORONARY ANGIOGRAPHY;  Surgeon: Leonie Man, MD;  Location: [place] [facility];  Service: Cardiovascular;  Laterality: N/A;   PRESSURE SENSOR/CARDIOMEMS N/A 08/26/2021   Procedure: PRESSURE SENSOR/CARDIOMEMS;  Surgeon: Loralie Champagne  S, MD;  Location: [place] [facility];  Service: Cardiovascular;  Laterality: N/A;   RIGHT HEART CATH N/A 08/26/2021   Procedure: RIGHT HEART CATH;  Surgeon: Larey Dresser, MD;  Location: [place] [facility];  Service: Cardiovascular;  Laterality: N/A;   TEE WITHOUT CARDIOVERSION N/A 08/26/2021   Procedure: TRANSESOPHAGEAL ECHOCARDIOGRAM (TEE);  Surgeon: Larey Dresser, MD;  [facility] ENDOSCOPY;  Service: Cardiovascular;  Laterality: N/A;   UMBILICAL HERNIA REPAIR       reports  that he quit smoking about 6 years ago. His smoking use included cigarettes. He has a 44.00 pack-year smoking history. He has been exposed to tobacco smoke. He has never used smokeless tobacco. He reports current alcohol use. He reports that he does not use drugs.  Allergies  Allergen Reactions   Vancomycin Other (See Comments)    "Red Man Syndrome" 02/02/17: possible cause for rash under both arms   Niacin Other (See Comments)   Niacin And Related Other (See Comments)    Red man syndrome   Tubersol [Tuberculin, Ppd] Other (See Comments)    Reaction unknown   Doxycycline Rash and Other (See Comments)    Family History  Problem Relation Age of Onset   CAD Maternal Grandfather    Diabetes Other    Diabetes Mellitus II Neg Hx    Colon cancer Neg Hx    Esophageal cancer Neg Hx    Inflammatory bowel disease Neg Hx    Liver disease Neg Hx    Pancreatic cancer Neg Hx    Rectal cancer Neg Hx    Stomach cancer Neg Hx    Sleep apnea Neg Hx      Prior to Admission medications   Medication Sig Start Date End Date Taking? Authorizing Provider  acetaminophen (TYLENOL) 325 MG tablet Take 2 tablets (650 mg total) by mouth every 4 (four) hours as needed for mild pain (or temp > 37.5 C (99.5 F)). 01/26/21   Cherene Altes, MD  albuterol (VENTOLIN HFA) 108 (90 Base) MCG/ACT inhaler Inhale 2 puffs into the lungs every 6 (six) hours as needed for wheezing or shortness of breath. 08/18/22   Hunsucker, Bonna Gains, MD  allopurinol (ZYLOPRIM) 100 MG tablet Take 200 mg by mouth daily.    [provider]  ARIPiprazole (ABILIFY) 5 MG tablet TAKE ONE TABLET BY MOUTH EVERYDAY AT BEDTIME Patient taking differently: Take 5 mg by mouth at bedtime. 03/09/22   Mozingo, Berdie Ogren, NP  atorvastatin (LIPITOR) 40 MG tablet Take 1 tablet (40 mg total) by mouth daily. 10/12/22   Elgie Collard, PA-C  busPIRone (BUSPAR) 10 MG tablet Take 1 tablet (10 mg total) by mouth 3 (three) times daily. 03/09/22    Mozingo, Berdie Ogren, NP  cetirizine (ZYRTEC) 10 MG tablet Take 10 mg by mouth at bedtime.     [provider]  Cholecalciferol (VITAMIN D3 PO) Take 2,000 Units by mouth daily.    [provider]  dapagliflozin propanediol (FARXIGA) 10 MG TABS tablet TAKE ONE TABLET BY MOUTH BEFORE BREAKFAST 08/05/22   Elayne Snare, MD  diltiazem (CARDIZEM CD) 120 MG 24 hr capsule TAKE ONE CAPSULE BY MOUTH ONCE DAILY 10/04/22   Larey Dresser, MD  ELIQUIS 5 MG TABS tablet TAKE ONE TABLET BY MOUTH TWICE DAILY 08/05/22   Larey Dresser, MD  fenofibrate (TRICOR) 145 MG tablet Take 1 tablet (145 mg total) by mouth daily. 10/12/22   Elgie Collard, PA-C  ferrous sulfate 324 (  65 Fe) MG TBEC Take 324 mg by mouth daily with breakfast.     [provider]  fluticasone (FLONASE) 50 MCG/ACT nasal spray Place 2 sprays into both nostrils daily.     [provider]  Fluticasone-Umeclidin-Vilant (TRELEGY ELLIPTA) 200-62.5-25 MCG/ACT AEPB INHALE 1 PUFF BY MOUTH INTO LUNGS ONCE DAILY 08/18/22   Hunsucker, Bonna Gains, MD  insulin aspart (NOVOLOG FLEXPEN) 100 UNIT/ML FlexPen 10 units before meals, keep sugar 2 hours after meals under 180 at least 10/20/22   Elayne Snare, MD  Insulin Pen Needle (B-D ULTRAFINE III SHORT PEN) 31G X 8 MM MISC Use for pen 10/20/22   Elayne Snare, MD  isosorbide mononitrate (IMDUR) 30 MG 24 hr tablet TAKE ONE TABLET BY MOUTH EVERY EVENING 10/12/22   Elgie Collard, PA-C  Lacosamide 150 MG TABS Take 1 tablet (150 mg total) by mouth 2 (two) times daily. 10/11/22   Alric Ran, MD  levETIRAcetam (KEPPRA) 500 MG tablet Take 1 tablet (500 mg total) by mouth 2 (two) times daily. 10/11/22   Alric Ran, MD  metFORMIN (GLUCOPHAGE-XR) 500 MG 24 hr tablet Take 4 tablets (2,000 mg total) by mouth daily. Patient taking differently: Take 1,000 mg by mouth 2 (two) times daily with a meal. 06/16/21   Renato Shin, MD  metoprolol succinate (TOPROL-XL) 25 MG 24 hr tablet Take 1 tablet (25  mg total) by mouth daily. 09/08/22   Larey Dresser, MD  mirtazapine (REMERON) 15 MG tablet TAKE ONE TABLET BY MOUTH EVERYDAY AT BEDTIME 10/05/22   Mozingo, Berdie Ogren, NP  omeprazole (PRILOSEC) 20 MG capsule Take 20 mg by mouth every morning. 05/29/20   [provider]  potassium chloride SA (KLOR-CON M) 20 MEQ tablet TAKE TWO TABLETS BY MOUTH TWICE DAILY 08/05/22   Larey Dresser, MD  prazosin (MINIPRESS) 2 MG capsule Take two tablets at bedtime. Patient taking differently: Take 4 mg by mouth at bedtime. 03/09/22   Mozingo, Berdie Ogren, NP  pregabalin (LYRICA) 150 MG capsule Take 1 capsule (150 mg total) by mouth 3 (three) times daily. 10/11/22   Alric Ran, MD  repaglinide (PRANDIN) 2 MG tablet TAKE TWO TABLETS BY MOUTH TWICE DAILY BEFORE A meal 10/04/22   Elayne Snare, MD  spironolactone (ALDACTONE) 25 MG tablet Take 0.5 tablets (12.5 mg total) by mouth daily. 05/27/22   Little Ishikawa, MD  tirzepatide [facility]) 15 MG/0.5ML Pen Inject 15 mg into the skin once a week. 10/20/22   Elayne Snare, MD  torsemide (DEMADEX) 20 MG tablet TAKE 4 TABLETS BY MOUTH EVERY MORNING and TAKE THREE TABLETS BY MOUTH EVERY EVENING Patient taking differently: Take 60-80 mg by mouth See admin instructions. Take 4 tablets (80mg ) by mouth every morning and three tablets (60mg ) by mouth every evening. 04/12/22   Larey Dresser, MD  TRESIBA FLEXTOUCH 100 UNIT/ML FlexTouch Pen INJECT 40 UNITS into THE SKIN ONCE DAILY 10/14/22   Elayne Snare, MD  VASCEPA 1 g capsule TAKE TWO CAPSULES BY MOUTH TWICE DAILY 08/05/22   Larey Dresser, MD  venlafaxine XR (EFFEXOR-XR) 75 MG 24 hr capsule Take 3 capsules (225 mg total) by mouth daily with breakfast. 03/09/22   Mozingo, Berdie Ogren, NP    Physical Exam: Vitals:   10/26/22 1430 10/26/22 1500 10/26/22 1630 10/26/22 1646  BP: 114/82 126/86 127/84   Pulse: (!) 104 81 82   Resp: 15 15 16    Temp:    97.7 F (36.5 C)  TempSrc:    Oral  SpO2: 94% 93% 95%      Constitutional: NAD, calm, comfortable Vitals:   10/26/22 1430 10/26/22 1500 10/26/22 1630 10/26/22 1646  BP: 114/82 126/86 127/84   Pulse: (!) 104 81 82   Resp: 15 15 16    Temp:    97.7 F (36.5 C)  TempSrc:    Oral  SpO2: 94% 93% 95%    Eyes: PERRL, lids and conjunctivae normal ENMT: Mucous membranes are moist. Posterior pharynx clear of any exudate or lesions.Normal dentition.  Neck: normal, supple, no masses, no thyromegaly Respiratory: clear to auscultation bilaterally, no wheezing, fine crackles on bilateral lower fields, increasing breathing effort. No accessory muscle use.  Cardiovascular: Regular rate and rhythm, no murmurs / rubs / gallops. 2+ extremity edema. 2+ pedal pulses. No carotid bruits.  Abdomen: no tenderness, no masses palpated. No hepatosplenomegaly. Bowel sounds positive.  Musculoskeletal: no clubbing / cyanosis. No joint deformity upper and lower extremities. Good ROM, no contractures. Normal muscle tone.  Skin: Rash and warm to touch bilateral lower extremity below the knees right> left with tenderness to touch. Neurologic: CN 2-12 grossly intact. Sensation intact, DTR normal. Strength 5/5 in all 4.  Psychiatric: Normal judgment and insight. Alert and oriented x 3. Normal mood.     Labs on Admission: I have personally reviewed following labs and imaging studies  CBC: Recent Labs  Lab 10/26/22 1250  WBC 13.4*  HGB 14.6  HCT 47.0  MCV 93.1  PLT 123XX123   Basic Metabolic Panel: Recent Labs  Lab 10/26/22 1250  NA 139  K 3.6  CL 99  CO2 28  GLUCOSE 106*  BUN 15  CREATININE 0.93  CALCIUM 9.1   GFR: Estimated Creatinine Clearance: 130 mL/min (by C-G formula based on SCr of 0.93 mg/dL). Liver Function Tests: No results for input(s): "AST", "ALT", "ALKPHOS", "BILITOT", "PROT", "ALBUMIN" in the last 168 hours. No results for input(s): "LIPASE", "AMYLASE" in the last 168 hours. No results for input(s): "AMMONIA" in the last 168 hours. Coagulation  Profile: No results for input(s): "INR", "PROTIME" in the last 168 hours. Cardiac Enzymes: No results for input(s): "CKTOTAL", "CKMB", "CKMBINDEX", "TROPONINI" in the last 168 hours. BNP (last 3 results) No results for input(s): "PROBNP" in the last 8760 hours. HbA1C: No results for input(s): "HGBA1C" in the last 72 hours. CBG: Recent Labs  Lab 10/26/22 1252  GLUCAP 102*   Lipid Profile: No results for input(s): "CHOL", "HDL", "LDLCALC", "TRIG", "CHOLHDL", "LDLDIRECT" in the last 72 hours. Thyroid Function Tests: No results for input(s): "TSH", "T4TOTAL", "FREET4", "T3FREE", "THYROIDAB" in the last 72 hours. Anemia Panel: No results for input(s): "VITAMINB12", "FOLATE", "FERRITIN", "TIBC", "IRON", "RETICCTPCT" in the last 72 hours. Urine analysis:    Component Value Date/Time   COLORURINE YELLOW 10/26/2022 1247   APPEARANCEUR CLEAR 10/26/2022 1247   LABSPEC 1.011 10/26/2022 1247   PHURINE 5.0 10/26/2022 1247   GLUCOSEU >=500 (A) 10/26/2022 1247   HGBUR NEGATIVE 10/26/2022 1247   BILIRUBINUR NEGATIVE 10/26/2022 1247   KETONESUR NEGATIVE 10/26/2022 1247   PROTEINUR NEGATIVE 10/26/2022 1247   NITRITE NEGATIVE 10/26/2022 1247   LEUKOCYTESUR NEGATIVE 10/26/2022 1247    Radiological Exams on Admission: CT Angio Chest Pulmonary Embolism (PE) W or WO Contrast  Result Date: 10/26/2022 CLINICAL DATA:  PE EXAM: CT ANGIOGRAPHY CHEST WITH CONTRAST TECHNIQUE: Multidetector CT imaging of the chest was performed using the standard protocol during bolus administration of intravenous contrast. Multiplanar CT image reconstructions and MIPs were obtained to evaluate the vascular anatomy. RADIATION DOSE  REDUCTION: This exam was performed according to the departmental dose-optimization program which includes automated exposure control, adjustment of the mA and/or kV according to patient size and/or use of iterative reconstruction technique. CONTRAST:  53mL OMNIPAQUE IOHEXOL 350 MG/ML SOLN  COMPARISON:  09/09/2019. FINDINGS: Cardiovascular: Enlarged heart. Atheromatous changes aorta and coronary arteries. No filling defects in pulmonary arteries to indicate PE. No aortic aneurysm. No pericardial effusion. There is some reflux of contrast in the IVC consistent with tricuspid insufficiency. Mediastinum/Nodes: No enlarged mediastinal, hilar, or axillary lymph nodes. Thyroid gland, trachea, and esophagus demonstrate no significant findings. Lungs/Pleura: Small bilateral pleural effusions. Ground-glass opacities in both lungs especially in the dependent portions consistent with pneumonitis atelectasis or pulmonary edema. No pneumothorax. Trace pleural effusions. Upper Abdomen: No acute process identified. There is a partially imaged hypodensity in the liver presumably cysts measuring at least 3.7 cm. Musculoskeletal: Thoracic degenerative changes identified. Review of the MIP images confirms the above findings. IMPRESSION: 1. No evidence of PE. 2. Findings are consistent with CHF. Electronically Signed   By: Sammie Bench M.D.   On: 10/26/2022 16:19   DG Chest Portable 1 View  Result Date: 10/26/2022 CLINICAL DATA:  Shortness of breath. EXAM: PORTABLE CHEST 1 VIEW COMPARISON:  March 09, 2022. FINDINGS: Stable cardiomediastinal silhouette. Minimal bibasilar subsegmental atelectasis is noted. Bony thorax is unremarkable. IMPRESSION: Minimal bibasilar subsegmental atelectasis. Electronically Signed   By: Marijo Conception M.D.   On: 10/26/2022 13:37    EKG: Independently reviewed.  Sinus rhythm, no acute ST changes.  Assessment/Plan Active Problems:   * No active hospital problems. *  (please populate well all problems here in Problem List. (For example, if patient is on BP meds at home and you resume or decide to hold them, it is a problem that needs to be her. Same for CAD, COPD, HLD and so on)  Acute hypoxic respiratory failure -Secondary to acute on chronic HFpEF decompensation -With  significant symptoms signs of fluid overload -Agreed with continuing IV Lasix 40 mg twice daily -Echocardiogram was done less than 6 months ago, will not repeat at this point -Continue Imdur, metoprolol, and spironolactone.  Continue Cardizem with caution -Supporting inhalers, Robitussin and incentive spirometry regular, -1 dose p.o. potassium to make K> 4.0 -Check ambulation pulse ox for home O2 evaluation after maximizing his CHF medications  Chest pain -Pleuritic, CTA negative for PE -Conservative management, Tylenol as needed  B/L lower extremity cellulitis -Start cephalexin 500 mg every 6 hours -On Eliquis, DVT unlikely  IDDM  -Glucose borderline low -Hold off metformin as patient received IV contrast today -Sliding scale for now, cut down long-acting insulin to half home dosage  OSA -Continue HS CPAP  Seizure disorder -No acute concern, continue Keppra and Vimpat  COPD -Stable, less likely to contribute his breathing symptoms -Continue Breo, and as needed breathing meds  DM neuropathy -Continue Lyrica  Anxiety/depression -SSRI and SNRI  PAF -In sinus rhythm, continue metoprolol Cardizem -Continue Eliquis  Morbid obesity -Estimated BMI> 35 -Patient however not interested in bariatric surgery  DVT prophylaxis: Eliquis Code Status: Full code Family Communication: None at bedside Disposition Plan: Expect that patient do not need more than 2 midnight hospital stay Consults called: None Admission status: Telemetric observation   Lequita Halt MD [facility] Pager [phone]  10/26/2022, 5:36 PM

## 2022-10-26 NOTE — ED Triage Notes (Addendum)
Pt BIBGEMS for weakness. Pt reports tingling to the left that has since subsided. Bilateral lower extremity edema. Pt reports SOB x1 week.  134/92 77 hr 153 cbg 18 LAC  Pt able to stand and pivot to stretcher with assistance.  Hx stroke no deficits

## 2022-10-27 DIAGNOSIS — Z6841 Body Mass Index (BMI) 40.0 and over, adult: Secondary | ICD-10-CM | POA: Diagnosis not present

## 2022-10-27 DIAGNOSIS — J449 Chronic obstructive pulmonary disease, unspecified: Secondary | ICD-10-CM

## 2022-10-27 DIAGNOSIS — I10 Essential (primary) hypertension: Secondary | ICD-10-CM | POA: Diagnosis not present

## 2022-10-27 DIAGNOSIS — I739 Peripheral vascular disease, unspecified: Secondary | ICD-10-CM | POA: Diagnosis present

## 2022-10-27 DIAGNOSIS — I5033 Acute on chronic diastolic (congestive) heart failure: Secondary | ICD-10-CM | POA: Diagnosis present

## 2022-10-27 DIAGNOSIS — I480 Paroxysmal atrial fibrillation: Secondary | ICD-10-CM | POA: Diagnosis present

## 2022-10-27 DIAGNOSIS — Z79899 Other long term (current) drug therapy: Secondary | ICD-10-CM | POA: Diagnosis not present

## 2022-10-27 DIAGNOSIS — E1169 Type 2 diabetes mellitus with other specified complication: Secondary | ICD-10-CM | POA: Diagnosis present

## 2022-10-27 DIAGNOSIS — Z794 Long term (current) use of insulin: Secondary | ICD-10-CM | POA: Diagnosis not present

## 2022-10-27 DIAGNOSIS — E785 Hyperlipidemia, unspecified: Secondary | ICD-10-CM

## 2022-10-27 DIAGNOSIS — E1140 Type 2 diabetes mellitus with diabetic neuropathy, unspecified: Secondary | ICD-10-CM | POA: Diagnosis present

## 2022-10-27 DIAGNOSIS — E7800 Pure hypercholesterolemia, unspecified: Secondary | ICD-10-CM | POA: Diagnosis present

## 2022-10-27 DIAGNOSIS — J4489 Other specified chronic obstructive pulmonary disease: Secondary | ICD-10-CM | POA: Diagnosis present

## 2022-10-27 DIAGNOSIS — I509 Heart failure, unspecified: Secondary | ICD-10-CM | POA: Diagnosis present

## 2022-10-27 DIAGNOSIS — Z95811 Presence of heart assist device: Secondary | ICD-10-CM | POA: Diagnosis not present

## 2022-10-27 DIAGNOSIS — J9601 Acute respiratory failure with hypoxia: Secondary | ICD-10-CM | POA: Diagnosis present

## 2022-10-27 DIAGNOSIS — M109 Gout, unspecified: Secondary | ICD-10-CM

## 2022-10-27 DIAGNOSIS — G40909 Epilepsy, unspecified, not intractable, without status epilepticus: Secondary | ICD-10-CM | POA: Diagnosis present

## 2022-10-27 DIAGNOSIS — I69354 Hemiplegia and hemiparesis following cerebral infarction affecting left non-dominant side: Secondary | ICD-10-CM | POA: Diagnosis not present

## 2022-10-27 DIAGNOSIS — F32A Depression, unspecified: Secondary | ICD-10-CM | POA: Diagnosis present

## 2022-10-27 DIAGNOSIS — Z66 Do not resuscitate: Secondary | ICD-10-CM | POA: Diagnosis present

## 2022-10-27 DIAGNOSIS — I350 Nonrheumatic aortic (valve) stenosis: Secondary | ICD-10-CM | POA: Diagnosis present

## 2022-10-27 DIAGNOSIS — I110 Hypertensive heart disease with heart failure: Secondary | ICD-10-CM | POA: Diagnosis present

## 2022-10-27 DIAGNOSIS — Z1152 Encounter for screening for COVID-19: Secondary | ICD-10-CM | POA: Diagnosis not present

## 2022-10-27 DIAGNOSIS — E876 Hypokalemia: Secondary | ICD-10-CM

## 2022-10-27 DIAGNOSIS — G4733 Obstructive sleep apnea (adult) (pediatric): Secondary | ICD-10-CM | POA: Diagnosis present

## 2022-10-27 DIAGNOSIS — E871 Hypo-osmolality and hyponatremia: Secondary | ICD-10-CM | POA: Diagnosis not present

## 2022-10-27 LAB — BASIC METABOLIC PANEL
Anion gap: 7 (ref 5–15)
BUN: 17 mg/dL (ref 8–23)
CO2: 27 mmol/L (ref 22–32)
Calcium: 8.4 mg/dL — ABNORMAL LOW (ref 8.9–10.3)
Chloride: 103 mmol/L (ref 98–111)
Creatinine, Ser: 0.94 mg/dL (ref 0.61–1.24)
GFR, Estimated: >60 mL/min (ref >60)
Glucose, Bld: 124 mg/dL — ABNORMAL HIGH (ref 70–99)
Potassium: 3.3 mmol/L — ABNORMAL LOW (ref 3.5–5.1)
Sodium: 137 mmol/L (ref 135–145)

## 2022-10-27 LAB — GLUCOSE, CAPILLARY
Glucose-Capillary: 139 mg/dL — ABNORMAL HIGH (ref 70–99)
Glucose-Capillary: 154 mg/dL — ABNORMAL HIGH (ref 70–99)
Glucose-Capillary: 132 mg/dL — ABNORMAL HIGH (ref 70–99)
Glucose-Capillary: 114 mg/dL — ABNORMAL HIGH (ref 70–99)
Glucose-Capillary: 104 mg/dL — ABNORMAL HIGH (ref 70–99)

## 2022-10-27 LAB — HIV ANTIBODY (ROUTINE TESTING W REFLEX): HIV Screen 4th Generation wRfx: NONREACTIVE

## 2022-10-27 MED ORDER — INSULIN GLARGINE-YFGN 100 UNIT/ML ~~LOC~~ SOLN
10.0000 [IU] | Freq: Every day | SUBCUTANEOUS | Status: DC
  Administered 2022-10-27 – 2022-10-30 (×4): 10 [IU] via SUBCUTANEOUS
  Filled 2022-10-27 (×5): qty 0.1

## 2022-10-27 MED ORDER — ALLOPURINOL 100 MG PO TABS
200.0000 mg | ORAL_TABLET | Freq: Two times a day (BID) | ORAL | Status: DC
  Administered 2022-10-27 – 2022-10-31 (×8): 200 mg via ORAL
  Filled 2022-10-27 (×8): qty 2

## 2022-10-27 MED ORDER — FUROSEMIDE 10 MG/ML IJ SOLN
60.0000 mg | Freq: Two times a day (BID) | INTRAMUSCULAR | Status: DC
  Administered 2022-10-27 – 2022-10-31 (×8): 60 mg via INTRAVENOUS
  Filled 2022-10-27 (×8): qty 6

## 2022-10-27 MED ORDER — PREDNISONE 20 MG PO TABS
20.0000 mg | ORAL_TABLET | Freq: Every day | ORAL | Status: AC
  Administered 2022-10-28 – 2022-10-30 (×3): 20 mg via ORAL
  Filled 2022-10-27 (×3): qty 1

## 2022-10-27 NOTE — Assessment & Plan Note (Signed)
Calculated BMI is 46 consistent with obesity class 3.

## 2022-10-27 NOTE — Assessment & Plan Note (Signed)
Continue with keppra and lacosamide.   Depression, continue with venlafaxine.

## 2022-10-27 NOTE — Plan of Care (Signed)
  Problem: Education: Goal: Ability to describe self-care measures that may prevent or decrease complications (Diabetes Survival Skills Education) will improve Outcome: Progressing Goal: Individualized Educational Video(s) Outcome: Progressing   Problem: Coping: Goal: Ability to adjust to condition or change in health will improve Outcome: Progressing   Problem: Fluid Volume: Goal: Ability to maintain a balanced intake and output will improve Outcome: Progressing   Problem: Health Behavior/Discharge Planning: Goal: Ability to identify and utilize available resources and services will improve Outcome: Progressing Goal: Ability to manage health-related needs will improve Outcome: Progressing   Problem: Metabolic: Goal: Ability to maintain appropriate glucose levels will improve Outcome: Progressing   Problem: Nutritional: Goal: Maintenance of adequate nutrition will improve Outcome: Progressing Goal: Progress toward achieving an optimal weight will improve Outcome: Progressing   Problem: Skin Integrity: Goal: Risk for impaired skin integrity will decrease Outcome: Progressing   Problem: Tissue Perfusion: Goal: Adequacy of tissue perfusion will improve Outcome: Progressing   Problem: Education: Goal: Knowledge of General Education information will improve Description: Including pain rating scale, medication(s)/side effects and non-pharmacologic comfort measures Outcome: Progressing   Problem: Health Behavior/Discharge Planning: Goal: Ability to manage health-related needs will improve Outcome: Progressing   Problem: Clinical Measurements: Goal: Ability to maintain clinical measurements within normal limits will improve Outcome: Progressing Goal: Will remain free from infection Outcome: Progressing Goal: Diagnostic test results will improve Outcome: Progressing Goal: Respiratory complications will improve Outcome: Progressing Goal: Cardiovascular complication will  be avoided Outcome: Progressing   Problem: Activity: Goal: Risk for activity intolerance will decrease Outcome: Progressing   Problem: Nutrition: Goal: Adequate nutrition will be maintained Outcome: Progressing   Problem: Coping: Goal: Level of anxiety will decrease Outcome: Progressing   Problem: Elimination: Goal: Will not experience complications related to bowel motility Outcome: Progressing Goal: Will not experience complications related to urinary retention Outcome: Progressing   Problem: Pain Managment: Goal: General experience of comfort will improve Outcome: Progressing   Problem: Safety: Goal: Ability to remain free from injury will improve Outcome: Progressing   Problem: Skin Integrity: Goal: Risk for impaired skin integrity will decrease Outcome: Progressing   Problem: Education: Goal: Ability to demonstrate management of disease process will improve Outcome: Progressing Goal: Ability to verbalize understanding of medication therapies will improve Outcome: Progressing Goal: Individualized Educational Video(s) Outcome: Progressing   Problem: Activity: Goal: Capacity to carry out activities will improve Outcome: Progressing   Problem: Cardiac: Goal: Ability to achieve and maintain adequate cardiopulmonary perfusion will improve Outcome: Progressing   

## 2022-10-27 NOTE — Progress Notes (Signed)
Heart Failure Navigator Progress Note  Assessed for Heart & Vascular TOC clinic readiness.  Patient does not meet criteria due to Advanced Heart Failure patient of Dr. McLean.   Navigator will sign off at this time.   Marlin Jarrard, BSN, RN Heart Failure Nurse Navigator Secure Chat Only   

## 2022-10-27 NOTE — Assessment & Plan Note (Signed)
Continue rate control with diltiazem and metoprolol.  Continue anticoagulation with apixaban.

## 2022-10-27 NOTE — TOC Initial Note (Signed)
Transition of Care [facility]) - Initial/Assessment Note    Patient Details  Name: Lawrence [name] MRN: [id number] Date of Birth: [birth year]  Transition of Care [facility]) CM/SW Contact:    Zenon Mayo, RN Phone Number: 10/27/2022, 11:25 AM  Clinical Narrative:                 From home with sign other, he has a service dog. He has a cpap machine, rollator, sccooter, bsc, scale, bp cuff and he eats a no sodium heart health diet. He has a glucometer also.  He takes the disability Lucianne Lei to [employer], and uses [employer] [facility] for transport, but says he will need PTAR transport at dc.  He presents with CHF ex, conts on iv lasix.  Expected Discharge Plan: Home/Self Care Barriers to Discharge: Continued Medical Work up   Patient Goals and CMS Choice Patient states their goals for this hospitalization and ongoing recovery are:: return home with sign other   Choice offered to / list presented to : NA      Expected Discharge Plan and Services In-house Referral: NA Discharge Planning Services: CM Consult Post Acute Care Choice: NA Living arrangements for the past 2 months: Single Family Home                 DME Arranged: N/A DME Agency: NA       HH Arranged: NA          Prior Living Arrangements/Services Living arrangements for the past 2 months: Single Family Home Lives with:: Significant Other Patient language and need for interpreter reviewed:: Yes Do you feel safe going back to the place where you live?: Yes      Need for Family Participation in Patient Care: Yes (Comment) Care giver support system in place?: Yes (comment) Current home services: DME (cpap , rollator, scooter, bsc,) Criminal Activity/Legal Involvement Pertinent to Current Situation/Hospitalization: No - Comment as needed  Activities of Daily Living Home Assistive Devices/Equipment: None ADL Screening (condition at time of admission) Patient's cognitive ability adequate to safely complete  daily activities?: Yes Is the patient deaf or have difficulty hearing?: No Does the patient have difficulty seeing, even when wearing glasses/contacts?: No Does the patient have difficulty concentrating, remembering, or making decisions?: No Patient able to express need for assistance with ADLs?: Yes Does the patient have difficulty dressing or bathing?: No Independently performs ADLs?: Yes (appropriate for developmental age) Does the patient have difficulty walking or climbing stairs?: Yes Weakness of Legs: Left Weakness of Arms/Hands: None  Permission Sought/Granted                  Emotional Assessment Appearance:: Appears stated age Attitude/Demeanor/Rapport: Engaged Affect (typically observed): Appropriate Orientation: : Oriented to Self, Oriented to Place, Oriented to  Time, Oriented to Situation Alcohol / Substance Use: Not Applicable Psych Involvement: No (comment)  Admission diagnosis:  CHF (congestive heart failure) (HCC) [I50.9] SOB (shortness of breath) [R06.02] Hypoxia [R09.02] Patient Active Problem List   Diagnosis Date Noted   CHF (congestive heart failure) ([place]) 10/26/2022   Right sided weakness 05/26/2022   Dysarthria 05/25/2022   Ischemic stroke ([place]) 02/25/2022   Hemiparesis affecting left side as late effect of cerebrovascular accident (CVA) ([place]) 02/25/2022   Durable power of attorney for healthcare exists  02/25/2022   Depression 08/07/2021   Paronychia of great toe, left 07/07/2021   Stroke-like symptoms 04/27/2021   Ambulatory dysfunction 04/27/2021   Left-sided weakness 02/09/2021   Atrial fibrillation  with rapid ventricular response ([place]) 02/08/2021   AF (paroxysmal atrial fibrillation) ([place]) 01/25/2021   COPD (chronic obstructive pulmonary disease) ([place]) 01/25/2021   Aortic stenosis 01/21/2021   Acquired dilation of ascending aorta and aortic root ([place]) 01/21/2021   Coronary artery disease due to lipid rich plaque    SVT (supraventricular  tachycardia)    Seizure ([place])    Essential hypertension 10/17/2020   Hypokalemia 10/17/2020   Cellulitis of right leg 12/04/2019   Pain due to onychomycosis of toenails of both feet 09/11/2019   Diabetic neuropathy ([place]) 09/11/2019   Type 2 diabetes mellitus with diabetic polyneuropathy, with long-term current use of insulin ([place]) 09/11/2019   Sinus tachycardia 03/07/2019   Chronic venous stasis 03/07/2019   History of anemia 12/26/2018   Anemia 08/24/2018   History of colon polyps 08/24/2018   Do not resuscitate status 08/24/2018   Chronic diastolic CHF (congestive heart failure) ([place]) 08/18/2018   Coronary artery calcification seen on CAT scan    Pure hypercholesterolemia    Shortness of breath    Right foot pain 01/13/2018   Acute on chronic diastolic CHF (congestive heart failure) ([place]) 01/13/2018   Diabetes mellitus type 2 in obese ([place]) 01/13/2018   Lower extremity cellulitis 11/07/2017   Primary osteoarthritis, left shoulder 03/05/2017   BPH without obstruction/lower urinary tract symptoms 02/22/2017   Hematuria 02/22/2017   Peripheral neuropathy 02/22/2017   Physical deconditioning 02/22/2017   Morbid obesity ([place])    PTSD (post-traumatic stress disorder)    Anasarca 01/31/2017   Bilateral lower extremity edema 01/31/2017   OSA (obstructive sleep apnea)    PCP:  [name], PA-C Pharmacy:   [facility] - [place], [place] - [address] [place] [address] Phone: [phone] Fax: [phone]     Social Determinants of Health (SDOH) Social History: SDOH Screenings   Food Insecurity: No Food Insecurity (10/26/2022)  Housing: Low Risk  (10/26/2022)  Transportation Needs: No Transportation Needs (10/26/2022)  Utilities: Not At Risk (10/26/2022)  Alcohol Screen: Low Risk  (06/08/2021)  Financial Resource Strain: Medium Risk (06/08/2021)  Tobacco Use: Medium Risk (10/26/2022)   SDOH  Interventions: Housing Interventions: Intervention Not Indicated   Readmission Risk Interventions    08/12/2021   10:14 AM 08/12/2021   10:12 AM 02/11/2021    1:34 PM  Readmission Risk Prevention Plan  Transportation Screening Complete Complete Complete  Medication Review (RN Care Manager) Complete Complete Complete  PCP or Specialist appointment within 3-5 days of discharge Complete    HRI or [place] Complete  Complete  SW Recovery Care/Counseling Consult Complete  Complete  Palliative Care Screening Not Applicable  Not [place] Not Applicable  Not Applicable

## 2022-10-27 NOTE — Assessment & Plan Note (Addendum)
Renal function stable with serum cr at 0,81, K is 4,3 and serum bicarbonate at 27.  Na 138 Mg 2.3   Continue diuresis with furosemide and spironolactone.  Follow up renal function in am.

## 2022-10-27 NOTE — Assessment & Plan Note (Signed)
No signs of acute exacerbation Continue 02 monitoring.

## 2022-10-27 NOTE — Assessment & Plan Note (Addendum)
05/2022 echocardiogram with preserved LV systolic function EF 60 to 65%, mild dilatated LV cavity, RV systolic function is preserved, LA with severe dilatation, moderate aortic stenosis.   Patient is sp cardiomems implantation (2023), follow up with heart failure clinic.   Patient was placed on IV furosemide for diuresis, negative fluid balance was achieved, -10,346 ml, with significant improvement in his symptoms.  Systolic blood pressure 142 to 129 mmHg.   Plan to continue medical therapy with spironolactone and metoprolol.  Continue with empagliflozin Add losartan for ARB.   Symmetrical bilateral lower extremity rash, possible due to peripheral vascular  Ruled out cellulitis.

## 2022-10-27 NOTE — Assessment & Plan Note (Addendum)
Continue blood pressure control with metoprolol. Considering diastolic heart failure will discontinue isosorbide and will place patient on losartan.  Continue spironolactone and torsemide.  Will need outpatient follow up BMP next week.

## 2022-10-27 NOTE — Assessment & Plan Note (Addendum)
 Glucose has been stable. Patient was treated with basal insulin  and sliding scale during his hospitalization.  Patient will resume diabetic regimen with dapagliflozin , insulin  aspart, metformin , repaglanide, tirzepatide , tresiba  injections 40 units daily.   Continue with statin therapy.

## 2022-10-27 NOTE — Assessment & Plan Note (Addendum)
Continue allopurinol for hyperuricemia.  Pain improving, continue prednisone for total of 3 days.

## 2022-10-27 NOTE — Progress Notes (Signed)
Progress Note   Patient: Lawrence [name] [id number] DOB: 09-30-1955 DOA: 10/26/2022     0 DOS: the patient was seen and examined on 10/27/2022   Brief hospital course: Lawrence [name] was admitted to the hospital with the working diagnosis of heart failure exacerbation.   67 yo male with the past medical history of heart failure, moderate aortic stenosis, CVA with left residual weakness, hypertension, atrial fibrillation and obesity who presented with dyspnea and weakness. Reported 5 to 6 days of dyspnea on exertion, associated with cough and chest pain. Positive lower extremity edema. The night prior patient had significant orthopnea and PND prompting him to come to the ED. On his initial physical examination his blood pressure was 130/90, HR 77, RR 15 and 02 saturation 94%, lungs with rales bilaterally and increased work of breathing, heart with S1 and S2 present and rhythmic, abdomen with no distention and positive lower extremity edema. Bilateral lower extremity rash, warm to the touch and tender to palpation more right than left.   Chest radiograph with cardiomegaly with bilateral hilar vascular congestion and cephalization of the vasculature.   EKG 81 bpm, normal axis, 1st degree AV block, sinus rhythm with no significant ST segment or T wave changes.       Assessment and Plan: Acute on chronic diastolic CHF (congestive heart failure) ([place]) 05/2022 echocardiogram with preserved LV systolic function EF 60 to 65%, mild dilatated LV cavity, RV systolic function is preserved, LA with severe dilatation, moderate aortic stenosis.   Patient is sp cardiomems implantation (2023), but apparently had difficulty getting reading per old records (personally reviewed).   Urine output documented at [id number] ml Systolic blood pressure is 132 to 138 mmHg.   Plan to continue diuresis with IV furosemide 60 mg IV q12 hrs Spironolactone and metoprolol.  Hold empagliflozin for now for possible skin infection.   Possible addition of ARB on this admission   Symmetrical bilateral lower extremity rash, possible due to peripheral vascular disease, will hold antibiotic therapy. Ruled out cellulitis.   AF (paroxysmal atrial fibrillation) (HCC) Continue rate control with diltiazem and metoprolol.  Continue anticoagulation with apixaban.   Essential hypertension Continue blood pressure control with metoprolol, and isosorbide.  Diuresis with spironolactone and furosemide.   COPD (chronic obstructive pulmonary disease) (HCC) No signs of acute exacerbation Continue 02 monitoring.   Type 2 diabetes mellitus with hyperlipidemia (HCC) Continue glucose cover and monitoring with insulin sliding scale. Reduce dose of basal insulin to prevent hypoglycemia.   Continue with statin therapy.   Seizure ([place]) Continue with keppra and lacosamide.   Depression, continue with venlafaxine.   Ischemic stroke ([place]) Continue anticoagulation with apixaban. Continue blood pressure control.    Morbid obesity ([place]) Calculated BMI is 46 consistent with obesity class 3.   Gout attack Continue allopurinol for hyperuricemia.  Add low dose prednisone for 3 days for acute gout attack.   Hypokalemia Renal function stable, plan to continue K correction with Kcl Continue diuresis with furosemide and spironolactone Follow up renal function and electrolytes in am.         Subjective: Patient is feeling better but not yet back to baseline, continue to have edema and dyspnea. Tender great toes bilaterally, similar to prior gout pain.   Physical Exam: Vitals:   10/27/22 0415 10/27/22 0712 10/27/22 1120 10/27/22 1513  BP: 127/86 (!) 137/92 (!) 132/91 138/80  Pulse: 91 85 82 82  Resp: 18 19 19 18   Temp: (!) 97.5 F (36.4 C) 97.7 F (  36.5 C) (!) 97.3 F (36.3 C) (!) 97.3 F (36.3 C)  TempSrc: Oral Oral Oral Oral  SpO2: 93% 91% 90% 93%  Weight: (!) 164.1 kg     Height:       Neurology awake and alert ENT  with mild pallor Cardiovascular with S1 and S2 present and rhythmic with no gallops, rubs or murmurs Respiratory with no rales or wheezing Abdomen with no distention  Positive lower extremity edema. ++ Symmetric lower extremity lateral erythema, with no purulence, no local increased temperature.  First metatarsal joint tender to palpation   Data Reviewed:    Family Communication: no family at the bedside   Disposition: Status is: Inpatient Remains inpatient appropriate because: heart failure   Planned Discharge Destination: Home      Author: Tawni Millers, MD 10/27/2022 4:27 PM  For on call review [email or web address].

## 2022-10-27 NOTE — Hospital Course (Addendum)
 Lawrence [name] was admitted to the hospital with the working diagnosis of heart failure exacerbation.   67 yo male with the past medical history of heart failure, moderate aortic stenosis, CVA with left residual weakness, hypertension, atrial fibrillation and obesity who presented with dyspnea and weakness. Reported 5 to 6 days of dyspnea on exertion, associated with cough and chest pain. Positive lower extremity edema. The night prior patient had significant orthopnea and PND prompting him to come to the ED. On his initial physical examination his blood pressure was 130/90, HR 77, RR 15 and 02 saturation 94%, lungs with rales bilaterally and increased work of breathing, heart with S1 and S2 present and rhythmic, abdomen with no distention and positive lower extremity edema. Bilateral lower extremity rash, warm to the touch and tender to palpation more right than left.   Na 139, K 3,6 Cl 99, bicarbonate 28 glucose 106 bun 15 cr 0,93  High sensitive troponin 7  Wbc 13.4 hgb 14,6 plt 251  Sars covid 19 negative  Urine analysis SG 1,011, negative protein, glucose >500   Chest radiograph with cardiomegaly with bilateral hilar vascular congestion and cephalization of the vasculature.   EKG 81 bpm, normal axis, 1st degree AV block, sinus rhythm with no significant ST segment or T wave changes.   03/22 patient placed on IV furosemide  with improvement in volume status, but continue hypervolemic.  03/23 patient is responding well to diuresis.  03/24 continue to respond well to diuresis, but not yet back to baseline he has 02 desaturation on room air.  03/25 patient has responded well to diuresis, plan to continue oral diuretic therapy at home and follow up with cardiology as outpatient.

## 2022-10-27 NOTE — Care Management Obs Status (Signed)
MEDICARE OBSERVATION STATUS NOTIFICATION   Patient Details  Name: ANDEN BROUILLETTE MRN: [id number] Date of Birth: 14-Nov-1955   Medicare Observation Status Notification Given:  Yes    Zenon Mayo, RN 10/27/2022, 11:19 AM

## 2022-10-27 NOTE — Assessment & Plan Note (Signed)
Continue anticoagulation with apixaban. Continue blood pressure control.

## 2022-10-28 ENCOUNTER — Telehealth: Payer: Self-pay

## 2022-10-28 DIAGNOSIS — I10 Essential (primary) hypertension: Secondary | ICD-10-CM | POA: Diagnosis not present

## 2022-10-28 DIAGNOSIS — J449 Chronic obstructive pulmonary disease, unspecified: Secondary | ICD-10-CM | POA: Diagnosis not present

## 2022-10-28 DIAGNOSIS — I639 Cerebral infarction, unspecified: Secondary | ICD-10-CM

## 2022-10-28 DIAGNOSIS — I5033 Acute on chronic diastolic (congestive) heart failure: Secondary | ICD-10-CM | POA: Diagnosis not present

## 2022-10-28 DIAGNOSIS — I480 Paroxysmal atrial fibrillation: Secondary | ICD-10-CM | POA: Diagnosis not present

## 2022-10-28 LAB — MAGNESIUM: Magnesium: 2.3 mg/dL (ref 1.7–2.4)

## 2022-10-28 LAB — BASIC METABOLIC PANEL
Anion gap: 8 (ref 5–15)
BUN: 15 mg/dL (ref 8–23)
CO2: 27 mmol/L (ref 22–32)
Calcium: 8.7 mg/dL — ABNORMAL LOW (ref 8.9–10.3)
Chloride: 103 mmol/L (ref 98–111)
Creatinine, Ser: 0.81 mg/dL (ref 0.61–1.24)
GFR, Estimated: >60 mL/min (ref >60)
Glucose, Bld: 144 mg/dL — ABNORMAL HIGH (ref 70–99)
Potassium: 4.3 mmol/L (ref 3.5–5.1)
Sodium: 138 mmol/L (ref 135–145)

## 2022-10-28 LAB — GLUCOSE, CAPILLARY
Glucose-Capillary: 145 mg/dL — ABNORMAL HIGH (ref 70–99)
Glucose-Capillary: 161 mg/dL — ABNORMAL HIGH (ref 70–99)
Glucose-Capillary: 136 mg/dL — ABNORMAL HIGH (ref 70–99)
Glucose-Capillary: 137 mg/dL — ABNORMAL HIGH (ref 70–99)

## 2022-10-28 NOTE — Progress Notes (Signed)
Progress Note   Patient: Lawrence [name] [id number] DOB: 15-Feb-1956 DOA: 10/26/2022     1 DOS: the patient was seen and examined on 10/28/2022   Brief hospital course: Lawrence [name] was admitted to the hospital with the working diagnosis of heart failure exacerbation.   67 yo male with the past medical history of heart failure, moderate aortic stenosis, CVA with left residual weakness, hypertension, atrial fibrillation and obesity who presented with dyspnea and weakness. Reported 5 to 6 days of dyspnea on exertion, associated with cough and chest pain. Positive lower extremity edema. The night prior patient had significant orthopnea and PND prompting him to come to the ED. On his initial physical examination his blood pressure was 130/90, HR 77, RR 15 and 02 saturation 94%, lungs with rales bilaterally and increased work of breathing, heart with S1 and S2 present and rhythmic, abdomen with no distention and positive lower extremity edema. Bilateral lower extremity rash, warm to the touch and tender to palpation more right than left.   Chest radiograph with cardiomegaly with bilateral hilar vascular congestion and cephalization of the vasculature.   EKG 81 bpm, normal axis, 1st degree AV block, sinus rhythm with no significant ST segment or T wave changes.   03/22 patient placed on IV furosemide with improvement in volume status, but continue hypervolemic.   Assessment and Plan: Acute on chronic diastolic CHF (congestive heart failure) ([place]) 05/2022 echocardiogram with preserved LV systolic function EF 60 to 65%, mild dilatated LV cavity, RV systolic function is preserved, LA with severe dilatation, moderate aortic stenosis.   Patient is sp cardiomems implantation (2023), but apparently had difficulty getting reading per old records (personally reviewed).   Urine output documented at [id number]  ml Systolic blood pressure is 130 to 109 mmHg.   Continue diuresis with IV furosemide 60 mg IV q12  hrs Spironolactone and metoprolol.  Hold empagliflozin for now for possible skin infection.  Possible addition of ARB on this admission   Symmetrical bilateral lower extremity rash, possible due to peripheral vascular disease, will hold antibiotic therapy. Rash has improved, at this point ruled out cellulitis.   AF (paroxysmal atrial fibrillation) (HCC) Continue rate control with diltiazem and metoprolol.  Continue anticoagulation with apixaban.   Essential hypertension Continue blood pressure control with metoprolol, and isosorbide.  Diuresis with spironolactone and furosemide.   COPD (chronic obstructive pulmonary disease) (HCC) No signs of acute exacerbation Continue 02 monitoring.   Type 2 diabetes mellitus with hyperlipidemia (HCC) Continue glucose cover and monitoring with insulin sliding scale. Reduce dose of basal insulin to prevent hypoglycemia.   Continue with statin therapy.   Seizure ([place]) Continue with keppra and lacosamide.   Depression, continue with venlafaxine.   Ischemic stroke ([place]) Continue anticoagulation with apixaban. Continue blood pressure control.    Morbid obesity ([place]) Calculated BMI is 46 consistent with obesity class 3.   Gout attack Continue allopurinol for hyperuricemia.  Continue with low dose prednisone for 3 days for acute gout attack.  He continue to have pain, but joint looks less red today.   Hypokalemia Renal function stable with serum cr at 0,81, K is 4,3 and serum bicarbonate at 27.  Na 138 Mg 2.3   Continue diuresis with furosemide and spironolactone.  Follow up renal function in am.         Subjective: Patient is feeling better, edema is improving, but not back yet to baseline, no chest pain, feet pain not worse than yesterday.   Physical Exam: Vitals:  10/28/22 0715 10/28/22 0827 10/28/22 0852 10/28/22 1043  BP: 130/88   109/82  Pulse: 79  83 80  Resp: 19  18 19   Temp: (!) 97.5 F (36.4 C)   97.9 F (36.6  C)  TempSrc: Oral   Oral  SpO2: 94%  95% 92%  Weight:  (!) 162.7 kg    Height:       Neurology awake and alert ENT with mild pallor Cardiovascular with S1 and S2 present and rhythmic with no gallops, rubs or murmurs No JVD (wide neck) Positive lower extremities ++ below the knees Respiratory with no rales or wheezing, no rhonchi Abdomen with no distention. Lower extremity rash is improving.  Data Reviewed:    Family Communication: no family at the bedside   Disposition: Status is: Inpatient Remains inpatient appropriate because: heart failure on IV furosemide   Planned Discharge Destination: Home    Author: Tawni Millers, MD 10/28/2022 12:32 PM  For on call review [email or web address].

## 2022-10-28 NOTE — Evaluation (Signed)
Physical Therapy Evaluation Patient Details Name: Lawrence [name] MRN: [id number] DOB: [birth year] Today's Date: 10/28/2022  History of Present Illness  67 y.o. male presenting to ED 10/26/22 withdyspnea and weakness. Reported 5 to 6 days of dyspnea on exertion, associated with cough and chest pain PMHx significant for DMII, HTN, COPD, CVA, parietal seizure, PTSD, diastolic CHF, A-fib, depression, OSA and HLD.  Clinical Impression  Pt presents with admitting diagnosis above. Pt tolerated treatment well today. Co treat with OT. Pt was able to ambulate in hallway and perform ADLs with OT while standing at the sink. Pt did require +2 Mod A for sit to stands however once standing pt is able to ambulate with only min guard A. During ambulation pt demonstrated R knee hyperextension and would likely benefit from a KAFO in the outpatient setting. Anticipate that pt will be able to DC home with HHPT and pt has support at home. PT will continue to follow.     Recommendations for follow up therapy are one component of a multi-disciplinary discharge planning process, led by the attending physician.  Recommendations may be updated based on patient status, additional functional criteria and insurance authorization.  Follow Up Recommendations Home health PT      Assistance Recommended at Discharge Intermittent Supervision/Assistance  Patient can return home with the following  A little help with walking and/or transfers;A little help with bathing/dressing/bathroom;Assistance with cooking/housework;Assist for transportation;Help with stairs or ramp for entrance    Equipment Recommendations None recommended by PT (Pt has needed DME at home)  Recommendations for Other Services       Functional Status Assessment Patient has had a recent decline in their functional status and demonstrates the ability to make significant improvements in function in a reasonable and predictable amount of time.     Precautions /  Restrictions Precautions Precautions: Fall Precaution Comments: Monitor 02 and HR Restrictions Weight Bearing Restrictions: No      Mobility  Bed Mobility Overal bed mobility: Needs Assistance Bed Mobility: Sit to Supine, Supine to Sit     Supine to sit: Supervision Sit to supine: Supervision   General bed mobility comments: Cues provided to square hips and shoulders while supine in bed. Pt uses bedrails to assist with bed mobility    Transfers Overall transfer level: Needs assistance Equipment used: Rollator (4 wheels) Transfers: Sit to/from Stand (Pt completed 3x STS total throughout session) Sit to Stand: Mod assist, +2 physical assistance           General transfer comment: Pt progressed in controlled descent of transfers    Ambulation/Gait Ambulation/Gait assistance: Min guard Gait Distance (Feet): 150 Feet Assistive device: Rollator (4 wheels) (Bariatric) Gait Pattern/deviations: Knee hyperextension - right, Step-through pattern, Decreased stride length, Trunk flexed Gait velocity: decreased     General Gait Details: Very slow steady gait however pt appears to ambulate on side of R foot with extreme R knee hyperextension.  Stairs            Wheelchair Mobility    Modified Rankin (Stroke Patients Only)       Balance Overall balance assessment: Needs assistance Sitting-balance support: Feet supported, No upper extremity supported Sitting balance-Leahy Scale: Good     Standing balance support: During functional activity, Single extremity supported Standing balance-Leahy Scale: Poor Standing balance comment: Pt able to brush teeth standing at sink with one UE supported  Pertinent Vitals/Pain Pain Assessment Pain Assessment: No/denies pain    Home Living Family/patient expects to be discharged to:: Private residence Living Arrangements: Spouse/significant other Available Help at Discharge:  Family;Available 24 hours/day Type of Home: Apartment (1st floor) Home Access: Level entry Entrance Stairs-Rails: Right     Home Layout: One level Home Equipment: Transport planner (4 wheels);Grab bars - tub/shower;Hospital bed Additional Comments: Has a PCA 4 days a week for about 4 hours a day    Prior Function Prior Level of Function : Needs assist       Physical Assist : ADLs (physical)   ADLs (physical): Bathing;Dressing;Toileting Mobility Comments: Rollator for household mobility (short distances); Transport planner in community dwellings. ADLs Comments: Reports some assist for ADLs from aide (bathing more than dressing). Able to complete toileting with min A.  BSC in bedroom, cannot access bathroom.     Hand Dominance   Dominant Hand: Right    Extremity/Trunk Assessment   Upper Extremity Assessment Upper Extremity Assessment: LUE deficits/detail;Overall WFL for tasks assessed LUE Deficits / Details: 3+/5 MMT strength elbow flex/ext, residual deficits from stroke    Lower Extremity Assessment Lower Extremity Assessment: LLE deficits/detail LLE Deficits / Details: 3/5 residual deficits from CVA    Cervical / Trunk Assessment Cervical / Trunk Assessment: Kyphotic  Communication   Communication: No difficulties  Cognition Arousal/Alertness: Awake/alert Behavior During Therapy: WFL for tasks assessed/performed Overall Cognitive Status: Within Functional Limits for tasks assessed                                          General Comments General comments (skin integrity, edema, etc.): Sp02 desat to 87% during functional ambulation, patient returned to 93%-95% Sp02 range following pursed lip breathing. Patient was fatigued and mildly SOB at end of OT/PT session.    Exercises     Assessment/Plan    PT Assessment Patient needs continued PT services  PT Problem List Decreased strength;Decreased activity tolerance;Decreased  balance;Decreased mobility;Decreased knowledge of use of DME;Decreased safety awareness       PT Treatment Interventions DME instruction;Gait training;Stair training;Functional mobility training;Therapeutic activities;Balance training;Neuromuscular re-education;Patient/family education    PT Goals (Current goals can be found in the Care Plan section)  Acute Rehab PT Goals Patient Stated Goal: to go home PT Goal Formulation: With patient Time For Goal Achievement: 11/11/22 Potential to Achieve Goals: Good    Frequency Min 1X/week     Co-evaluation   Reason for Co-Treatment: For patient/therapist safety;To address functional/ADL transfers PT goals addressed during session: Mobility/safety with mobility;Proper use of DME OT goals addressed during session: ADL's and self-care       AM-PAC PT "6 Clicks" Mobility  Outcome Measure Help needed turning from your back to your side while in a flat bed without using bedrails?: None Help needed moving from lying on your back to sitting on the side of a flat bed without using bedrails?: None Help needed moving to and from a bed to a chair (including a wheelchair)?: A Little Help needed standing up from a chair using your arms (e.g., wheelchair or bedside chair)?: A Lot Help needed to walk in hospital room?: A Little Help needed climbing 3-5 steps with a railing? : A Lot 6 Click Score: 18    End of Session Equipment Utilized During Treatment: Gait belt Activity Tolerance: Patient tolerated treatment well Patient left: in bed;with call bell/phone within reach;with  bed alarm set Nurse Communication: Mobility status PT Visit Diagnosis: Other abnormalities of gait and mobility (R26.89)    Time: [id number] PT Time Calculation (min) (ACUTE ONLY): 39 min   Charges:   PT Evaluation $PT Eval Moderate Complexity: 1 Mod PT Treatments $Gait Training: 8-22 mins        Shelby Mattocks, PT, DPT Acute Rehab Services [id number]   Viann Shove 10/28/2022, 1:47 PM

## 2022-10-28 NOTE — Evaluation (Signed)
Occupational Therapy Evaluation Patient Details Name: Lawrence [name] MRN: [id number] DOB: 03/19/1956 Today's Date: 10/28/2022   History of Present Illness 67 y.o. male presenting to ED 10/26/22 withdyspnea and weakness. Reported 5 to 6 days of dyspnea on exertion, associated with cough and chest pain PMHx significant for DMII, HTN, COPD, CVA, parietal seizure, PTSD, diastolic CHF, A-fib, depression, OSA and HLD.   Clinical Impression   Patient admitted for above diagnosis. PTA patient was living with significant other in a 1st floor apartment, he has an aide that assists with bADLs 4x/wk for about 4hrs a day. Pt reports his significant other can provide 24/7 support when the aide is not at the home, Pt reports ambulating with Rollator in home and electric scooter in the community. Pt currently needing Mod A 2+ to perform sit>stands and control the descent, but ambulates in the hall with Min guard assist. Pt does need total assist for LB dressing and requires UE support + Min guard to maintain standing balance at sink. Educated Pt on CHF signs/symptoms and precautions and patient verbalize understanding. Pt would benefit from continued skilled acute OT services to address above deficits and help transition to next level of care. Pt would benefit from post acute Home OT services to maximize functional independence in natural home environment.      Recommendations for follow up therapy are one component of a multi-disciplinary discharge planning process, led by the attending physician.  Recommendations may be updated based on patient status, additional functional criteria and insurance authorization.   Follow Up Recommendations  Home health OT     Assistance Recommended at Discharge Frequent or constant Supervision/Assistance  Patient can return home with the following A little help with walking and/or transfers;A lot of help with bathing/dressing/bathroom;Assistance with cooking/housework;Help with  stairs or ramp for entrance;Assist for transportation    Functional Status Assessment  Patient has had a recent decline in their functional status and demonstrates the ability to make significant improvements in function in a reasonable and predictable amount of time.  Equipment Recommendations  None recommended by OT (Pt has recommended DME)    Recommendations for Other Services       Precautions / Restrictions Precautions Precautions: Fall Precaution Comments: Monitor 02 and HR Restrictions Weight Bearing Restrictions: No      Mobility Bed Mobility Overal bed mobility: Needs Assistance Bed Mobility: Sit to Supine, Supine to Sit     Supine to sit: Supervision Sit to supine: Supervision   General bed mobility comments: Cues provided to square hips and shoulders while supine in bed. Pt uses bedrails to assist with bed mobility    Transfers Overall transfer level: Needs assistance Equipment used: Rollator (4 wheels) Transfers: Sit to/from Stand (Pt completed 3x STS total throughout session) Sit to Stand: Mod assist, +2 physical assistance           General transfer comment: Pt progressed in controlled descent of transfers      Balance Overall balance assessment: Needs assistance Sitting-balance support: Feet supported, No upper extremity supported Sitting balance-Leahy Scale: Good     Standing balance support: During functional activity, Single extremity supported Standing balance-Leahy Scale: Poor Standing balance comment: Pt able to brush teeth standing at sink with one UE supported                           ADL either performed or assessed with clinical judgement   ADL Overall ADL's : Needs assistance/impaired Eating/Feeding:  Independent   Grooming: Oral care;Min guard;Standing Grooming Details (indicate cue type and reason): standing at sink with RW Upper Body Bathing: Sitting;Min guard Upper Body Bathing Details (indicate cue type and  reason): Pt sponge bathes Lower Body Bathing: Sitting/lateral leans;Maximal assistance   Upper Body Dressing : Sitting;Min guard   Lower Body Dressing: Total assistance;Sitting/lateral leans Lower Body Dressing Details (indicate cue type and reason): Pt total assist to don socks Toilet Transfer: Min guard;Ambulation;Rollator (4 wheels) Toilet Transfer Details (indicate cue type and reason): Insurance claims handler. Pt Mod A 2+ for Sit>stands Toileting- Clothing Manipulation and Hygiene: Sit to/from stand;Moderate assistance   Tub/ Shower Transfer: Minimal assistance;Ambulation;Rolling walker (2 wheels)   Functional mobility during ADLs: Min guard;Rollator (4 wheels) General ADL Comments: Bariatric Rollator     Vision Baseline Vision/History: 1 Wears glasses       Perception     Praxis      Pertinent Vitals/Pain Pain Assessment Pain Assessment: No/denies pain     Hand Dominance Right   Extremity/Trunk Assessment Upper Extremity Assessment Upper Extremity Assessment: LUE deficits/detail;Overall WFL for tasks assessed LUE Deficits / Details: 3+/5 MMT strength elbow flex/ext, residual deficits from stroke   Lower Extremity Assessment Lower Extremity Assessment: LLE deficits/detail;Generalized weakness LLE Deficits / Details: 3/5 residual deficits from CVA   Cervical / Trunk Assessment Cervical / Trunk Assessment: Kyphotic   Communication Communication Communication: No difficulties   Cognition Arousal/Alertness: Awake/alert Behavior During Therapy: WFL for tasks assessed/performed Overall Cognitive Status: Within Functional Limits for tasks assessed                                       General Comments  Sp02 desat to 87% during functional ambulation, patient returned to 93%-95% Sp02 range following pursed lip breathing. Patient was fatigued and mildly SOB at end of OT/PT session.    Exercises     Shoulder Instructions      Home Living Family/patient  expects to be discharged to:: Private residence Living Arrangements: Spouse/significant other Available Help at Discharge: Family;Available 24 hours/day Type of Home: Apartment (1st floor) Home Access: Level entry   Entrance Stairs-Rails: Right Home Layout: One level     Bathroom Shower/Tub: Sponge bathes at baseline;Tub/shower unit   Bathroom Toilet: Standard Bathroom Accessibility: No   Home Equipment: Transport planner (4 wheels);Grab bars - tub/shower;Hospital bed   Additional Comments: Has a PCA 4 days a week for about 4 hours a day      Prior Functioning/Environment Prior Level of Function : Needs assist       Physical Assist : ADLs (physical)   ADLs (physical): Bathing;Dressing;Toileting Mobility Comments: Rollator for household mobility (short distances); Transport planner in community dwellings. ADLs Comments: Reports some assist for ADLs from aide (bathing more than dressing). Able to complete toileting with min A.  BSC in bedroom, cannot access bathroom.        OT Problem List: Decreased activity tolerance;Impaired balance (sitting and/or standing);Cardiopulmonary status limiting activity      OT Treatment/Interventions: Self-care/ADL training;Energy conservation;Balance training;Therapeutic activities;Patient/family education;DME and/or AE instruction;Therapeutic exercise    OT Goals(Current goals can be found in the care plan section) Acute Rehab OT Goals Patient Stated Goal: Return home OT Goal Formulation: With patient Time For Goal Achievement: 11/11/22 Potential to Achieve Goals: Good  OT Frequency: Min 2X/week    Co-evaluation PT/OT/SLP Co-Evaluation/Treatment: Yes Reason for Co-Treatment: For patient/therapist safety;To address functional/ADL transfers PT  goals addressed during session: Mobility/safety with mobility;Proper use of DME OT goals addressed during session: ADL's and self-care      AM-PAC OT "6 Clicks" Daily Activity      Outcome Measure Help from another person eating meals?: None Help from another person taking care of personal grooming?: A Little Help from another person toileting, which includes using toliet, bedpan, or urinal?: A Lot Help from another person bathing (including washing, rinsing, drying)?: A Lot Help from another person to put on and taking off regular upper body clothing?: A Little Help from another person to put on and taking off regular lower body clothing?: Total 6 Click Score: 15   End of Session Equipment Utilized During Treatment: Gait belt;Rollator (4 wheels) Financial controller) Nurse Communication: Mobility status  Activity Tolerance: Patient tolerated treatment well Patient left: in bed;with call bell/phone within reach  OT Visit Diagnosis: Unsteadiness on feet (R26.81);Other abnormalities of gait and mobility (R26.89)                Time: [id number] OT Time Calculation (min): 33 min Charges:  OT General Charges $OT Visit: 1 Visit OT Evaluation $OT Eval Moderate Complexity: 1 Mod  10/28/2022  AB, OTR/L  Acute Rehabilitation Services  Office: [place] 10/28/2022, 12:00 PM

## 2022-10-28 NOTE — Telephone Encounter (Signed)
Pt Lawrence [name] stating that [facility] doesn't have Mounjaro 15 mg in stock but do have the 10 mg in stock, would you like to send that instead?

## 2022-10-29 DIAGNOSIS — J449 Chronic obstructive pulmonary disease, unspecified: Secondary | ICD-10-CM | POA: Diagnosis not present

## 2022-10-29 DIAGNOSIS — E876 Hypokalemia: Secondary | ICD-10-CM | POA: Diagnosis not present

## 2022-10-29 DIAGNOSIS — I480 Paroxysmal atrial fibrillation: Secondary | ICD-10-CM | POA: Diagnosis not present

## 2022-10-29 DIAGNOSIS — I5033 Acute on chronic diastolic (congestive) heart failure: Secondary | ICD-10-CM | POA: Diagnosis not present

## 2022-10-29 LAB — GLUCOSE, CAPILLARY
Glucose-Capillary: 131 mg/dL — ABNORMAL HIGH (ref 70–99)
Glucose-Capillary: 174 mg/dL — ABNORMAL HIGH (ref 70–99)
Glucose-Capillary: 156 mg/dL — ABNORMAL HIGH (ref 70–99)
Glucose-Capillary: 142 mg/dL — ABNORMAL HIGH (ref 70–99)

## 2022-10-29 LAB — BASIC METABOLIC PANEL
Anion gap: 11 (ref 5–15)
BUN: 14 mg/dL (ref 8–23)
CO2: 24 mmol/L (ref 22–32)
Calcium: 8.6 mg/dL — ABNORMAL LOW (ref 8.9–10.3)
Chloride: 100 mmol/L (ref 98–111)
Creatinine, Ser: 0.86 mg/dL (ref 0.61–1.24)
GFR, Estimated: >60 mL/min (ref >60)
Glucose, Bld: 119 mg/dL — ABNORMAL HIGH (ref 70–99)
Potassium: 3.5 mmol/L (ref 3.5–5.1)
Sodium: 135 mmol/L (ref 135–145)

## 2022-10-29 MED ORDER — POTASSIUM CHLORIDE CRYS ER 20 MEQ PO TBCR
40.0000 meq | EXTENDED_RELEASE_TABLET | Freq: Once | ORAL | Status: AC
  Administered 2022-10-29: 40 meq via ORAL
  Filled 2022-10-29: qty 4

## 2022-10-29 NOTE — Progress Notes (Signed)
Progress Note   Patient: Lawrence [name] [id number] DOB: 02-18-1956 DOA: 10/26/2022     2 DOS: the patient was seen and examined on 10/29/2022   Brief hospital course: Mr. Stclair was admitted to the hospital with the working diagnosis of heart failure exacerbation.   67 yo male with the past medical history of heart failure, moderate aortic stenosis, CVA with left residual weakness, hypertension, atrial fibrillation and obesity who presented with dyspnea and weakness. Reported 5 to 6 days of dyspnea on exertion, associated with cough and chest pain. Positive lower extremity edema. The night prior patient had significant orthopnea and PND prompting him to come to the ED. On his initial physical examination his blood pressure was 130/90, HR 77, RR 15 and 02 saturation 94%, lungs with rales bilaterally and increased work of breathing, heart with S1 and S2 present and rhythmic, abdomen with no distention and positive lower extremity edema. Bilateral lower extremity rash, warm to the touch and tender to palpation more right than left.   Na 139, K 3,6 Cl 99, bicarbonate 28 glucose 106 bun 15 cr 0,93  High sensitive troponin 7  Wbc 13.4 hgb 14,6 plt 251  Sars covid 19 negative  Urine analysis SG 1,011, negative protein, glucose >500   Chest radiograph with cardiomegaly with bilateral hilar vascular congestion and cephalization of the vasculature.   EKG 81 bpm, normal axis, 1st degree AV block, sinus rhythm with no significant ST segment or T wave changes.   03/22 patient placed on IV furosemide with improvement in volume status, but continue hypervolemic.  03/23 patient is responding well to diuresis.   Assessment and Plan: Acute on chronic diastolic CHF (congestive heart failure) ([place]) 05/2022 echocardiogram with preserved LV systolic function EF 60 to 65%, mild dilatated LV cavity, RV systolic function is preserved, LA with severe dilatation, moderate aortic stenosis.   Patient is sp  cardiomems implantation (2023), follow up with heart failure clinic.   Urine output documented at [id number]  ml Systolic blood pressure is 123 to 109 mmHg.   Continue diuresis with IV furosemide 60 mg IV q12 hrs Spironolactone and metoprolol.  Hold empagliflozin for now for possible skin infection.  Possible addition of ARB on this admission   Symmetrical bilateral lower extremity rash, possible due to peripheral vascular  Ruled out cellulitis.   AF (paroxysmal atrial fibrillation) (HCC) Continue rate control with diltiazem and metoprolol.  Continue anticoagulation with apixaban.   Hypokalemia Patient responding well to diuresis, renal function today with serum cr at 0,86 with K at 3,5 and serum bicarbonate at 24.  Na 135 Mg 2,3   Add 40 meq kcl,   Continue diuresis with furosemide and spironolactone.  Follow up renal function in am.   COPD (chronic obstructive pulmonary disease) (HCC) No signs of acute exacerbation Continue 02 monitoring.   Type 2 diabetes mellitus with hyperlipidemia (HCC) Continue glucose cover and monitoring with insulin sliding scale. Basal insulin with reduced dose from home.  Fasting glucose today 119 mg/dl.   Continue with statin therapy.   Seizure ([place]) Continue with keppra and lacosamide.   Depression, continue with venlafaxine.   Ischemic stroke ([place]) Continue anticoagulation with apixaban. Continue blood pressure control.    Essential hypertension Continue blood pressure control with metoprolol, and isosorbide.  Diuresis with spironolactone and furosemide.   Gout attack Continue allopurinol for hyperuricemia.  Pain improving, continue prednisone for total of 3 days.   Morbid obesity ([place]) Calculated BMI is 46 consistent with obesity class  3.         Subjective: Patient is feeling better, edema continue to improve, foot pain is improving as well, no chest pain.   Physical Exam: Vitals:   10/29/22 0401 10/29/22 0848 10/29/22  0859 10/29/22 1235  BP: 121/77  123/70 109/71  Pulse: 72 91 82 83  Resp: 19 18 20 20   Temp: 97.6 F (36.4 C)  (!) 97.5 F (36.4 C) (!) 97.5 F (36.4 C)  TempSrc: Oral  Oral Oral  SpO2: 93% 92% 93% 92%  Weight:      Height:       Neurology awake and alert ENT with mild pallor Cardiovascular with S1 and S2 present and rhythmic with no gallops, rubs or murmurs No JVD Edema lower extremities ++ Respiratory with no rales or wheezing, no rhonchi Abdomen with no distention  Lower extremity rash has improved, today only mild erythema on the right and on the left has resolved.  Data Reviewed:    Family Communication: I spoke with patient's wife at the bedside, we talked in detail about patient's condition, plan of care and prognosis and all questions were addressed.   Disposition: Status is: Inpatient Remains inpatient appropriate because: heart failure   Planned Discharge Destination: Home      Author: Tawni Millers, MD 10/29/2022 1:26 PM  For on call review [email or web address].

## 2022-10-29 NOTE — Plan of Care (Signed)
Pt making all needs known. Has to be reminded to keep his nasal canula on, desats especially with sleep. Diabetes controlled with sliding scale. Has constant pain in lower extremities, taking Tylenol for pain when it can be given. No other concerns at this time. Continue to monitor.   Problem: Education: Goal: Ability to describe self-care measures that may prevent or decrease complications (Diabetes Survival Skills Education) will improve Outcome: Progressing Goal: Individualized Educational Video(s) Outcome: Progressing   Problem: Coping: Goal: Ability to adjust to condition or change in health will improve Outcome: Progressing   Problem: Fluid Volume: Goal: Ability to maintain a balanced intake and output will improve Outcome: Progressing   Problem: Health Behavior/Discharge Planning: Goal: Ability to identify and utilize available resources and services will improve Outcome: Progressing Goal: Ability to manage health-related needs will improve Outcome: Progressing   Problem: Metabolic: Goal: Ability to maintain appropriate glucose levels will improve Outcome: Progressing   Problem: Nutritional: Goal: Maintenance of adequate nutrition will improve Outcome: Progressing Goal: Progress toward achieving an optimal weight will improve Outcome: Progressing   Problem: Skin Integrity: Goal: Risk for impaired skin integrity will decrease Outcome: Progressing   Problem: Tissue Perfusion: Goal: Adequacy of tissue perfusion will improve Outcome: Progressing   Problem: Education: Goal: Knowledge of General Education information will improve Description: Including pain rating scale, medication(s)/side effects and non-pharmacologic comfort measures Outcome: Progressing   Problem: Health Behavior/Discharge Planning: Goal: Ability to manage health-related needs will improve Outcome: Progressing   Problem: Clinical Measurements: Goal: Ability to maintain clinical measurements  within normal limits will improve Outcome: Progressing Goal: Will remain free from infection Outcome: Progressing Goal: Diagnostic test results will improve Outcome: Progressing Goal: Respiratory complications will improve Outcome: Progressing Goal: Cardiovascular complication will be avoided Outcome: Progressing   Problem: Activity: Goal: Risk for activity intolerance will decrease Outcome: Progressing   Problem: Nutrition: Goal: Adequate nutrition will be maintained Outcome: Progressing   Problem: Coping: Goal: Level of anxiety will decrease Outcome: Progressing   Problem: Elimination: Goal: Will not experience complications related to bowel motility Outcome: Progressing Goal: Will not experience complications related to urinary retention Outcome: Progressing   Problem: Pain Managment: Goal: General experience of comfort will improve Outcome: Progressing   Problem: Safety: Goal: Ability to remain free from injury will improve Outcome: Progressing   Problem: Skin Integrity: Goal: Risk for impaired skin integrity will decrease Outcome: Progressing   Problem: Education: Goal: Ability to demonstrate management of disease process will improve Outcome: Progressing Goal: Ability to verbalize understanding of medication therapies will improve Outcome: Progressing Goal: Individualized Educational Video(s) Outcome: Progressing   Problem: Activity: Goal: Capacity to carry out activities will improve Outcome: Progressing   Problem: Cardiac: Goal: Ability to achieve and maintain adequate cardiopulmonary perfusion will improve Outcome: Progressing

## 2022-10-29 NOTE — Progress Notes (Signed)
Pt refused to wear Bipap tonight.

## 2022-10-30 DIAGNOSIS — E876 Hypokalemia: Secondary | ICD-10-CM | POA: Diagnosis not present

## 2022-10-30 DIAGNOSIS — R569 Unspecified convulsions: Secondary | ICD-10-CM

## 2022-10-30 DIAGNOSIS — I5033 Acute on chronic diastolic (congestive) heart failure: Secondary | ICD-10-CM | POA: Diagnosis not present

## 2022-10-30 DIAGNOSIS — I480 Paroxysmal atrial fibrillation: Secondary | ICD-10-CM | POA: Diagnosis not present

## 2022-10-30 DIAGNOSIS — J449 Chronic obstructive pulmonary disease, unspecified: Secondary | ICD-10-CM | POA: Diagnosis not present

## 2022-10-30 LAB — GLUCOSE, CAPILLARY
Glucose-Capillary: 113 mg/dL — ABNORMAL HIGH (ref 70–99)
Glucose-Capillary: 204 mg/dL — ABNORMAL HIGH (ref 70–99)
Glucose-Capillary: 170 mg/dL — ABNORMAL HIGH (ref 70–99)
Glucose-Capillary: 133 mg/dL — ABNORMAL HIGH (ref 70–99)

## 2022-10-30 LAB — BASIC METABOLIC PANEL
Anion gap: 9 (ref 5–15)
BUN: 15 mg/dL (ref 8–23)
CO2: 26 mmol/L (ref 22–32)
Calcium: 8.6 mg/dL — ABNORMAL LOW (ref 8.9–10.3)
Chloride: 102 mmol/L (ref 98–111)
Creatinine, Ser: 0.8 mg/dL (ref 0.61–1.24)
GFR, Estimated: >60 mL/min (ref >60)
Glucose, Bld: 129 mg/dL — ABNORMAL HIGH (ref 70–99)
Potassium: 3.5 mmol/L (ref 3.5–5.1)
Sodium: 137 mmol/L (ref 135–145)

## 2022-10-30 LAB — CBC
HCT: 43.3 % (ref 39.0–52.0)
Hemoglobin: 14.1 g/dL (ref 13.0–17.0)
MCH: 29.1 pg (ref 26.0–34.0)
MCHC: 32.6 g/dL (ref 30.0–36.0)
MCV: 89.3 fL (ref 80.0–100.0)
Platelets: 238 10*3/uL (ref 150–400)
RBC: 4.85 MIL/uL (ref 4.22–5.81)
RDW: 14.8 % (ref 11.5–15.5)
WBC: 11.9 10*3/uL — ABNORMAL HIGH (ref 4.0–10.5)
nRBC: 0 % (ref 0.0–0.2)

## 2022-10-30 MED ORDER — POTASSIUM CHLORIDE CRYS ER 20 MEQ PO TBCR
40.0000 meq | EXTENDED_RELEASE_TABLET | Freq: Once | ORAL | Status: AC
  Administered 2022-10-30: 40 meq via ORAL
  Filled 2022-10-30: qty 2

## 2022-10-30 NOTE — Progress Notes (Signed)
Pt refused CPAP for tonight.  

## 2022-10-30 NOTE — Progress Notes (Signed)
Progress Note   Patient: Lawrence [name] [id number] DOB: 1956/07/27 DOA: 10/26/2022     3 DOS: the patient was seen and examined on 10/30/2022   Brief hospital course: Mr. Blodgett was admitted to the hospital with the working diagnosis of heart failure exacerbation.   67 yo male with the past medical history of heart failure, moderate aortic stenosis, CVA with left residual weakness, hypertension, atrial fibrillation and obesity who presented with dyspnea and weakness. Reported 5 to 6 days of dyspnea on exertion, associated with cough and chest pain. Positive lower extremity edema. The night prior patient had significant orthopnea and PND prompting him to come to the ED. On his initial physical examination his blood pressure was 130/90, HR 77, RR 15 and 02 saturation 94%, lungs with rales bilaterally and increased work of breathing, heart with S1 and S2 present and rhythmic, abdomen with no distention and positive lower extremity edema. Bilateral lower extremity rash, warm to the touch and tender to palpation more right than left.   Na 139, K 3,6 Cl 99, bicarbonate 28 glucose 106 bun 15 cr 0,93  High sensitive troponin 7  Wbc 13.4 hgb 14,6 plt 251  Sars covid 19 negative  Urine analysis SG 1,011, negative protein, glucose >500   Chest radiograph with cardiomegaly with bilateral hilar vascular congestion and cephalization of the vasculature.   EKG 81 bpm, normal axis, 1st degree AV block, sinus rhythm with no significant ST segment or T wave changes.   03/22 patient placed on IV furosemide with improvement in volume status, but continue hypervolemic.  03/23 patient is responding well to diuresis.  03/24 continue to respond well to diuresis, but not yet back to baseline he has 02 desaturation on room air.   Assessment and Plan: Acute on chronic diastolic CHF (congestive heart failure) ([place]) 05/2022 echocardiogram with preserved LV systolic function EF 60 to 65%, mild dilatated LV cavity,  RV systolic function is preserved, LA with severe dilatation, moderate aortic stenosis.   Patient is sp cardiomems implantation (2023), follow up with heart failure clinic.   Urine output documented at [id number] ml Systolic blood pressure is 119 to 135 mmHg.   Continue diuresis with IV furosemide 60 mg IV q12 hrs Spironolactone and metoprolol.  Hold empagliflozin for now for possible skin infection.  Possible addition of ARB on this admission   Symmetrical bilateral lower extremity rash, possible due to peripheral vascular  Ruled out cellulitis.   AF (paroxysmal atrial fibrillation) (HCC) Continue rate control with diltiazem and metoprolol.  Continue anticoagulation with apixaban.   Hypokalemia Improving volume status, his renal function today with serum cr at 0,80 with K at 3,5 and serum bicarbonate at 26. Na is 137 and BUN 15.   Add 40 meq kcl,   Continue diuresis with furosemide and spironolactone.  Follow up renal function in am.   COPD (chronic obstructive pulmonary disease) (HCC) No signs of acute exacerbation Continue 02 monitoring.   Type 2 diabetes mellitus with hyperlipidemia (HCC) Glucose has been stable. Continue insulin sliding scale and basal insulin.   Continue with statin therapy.   Seizure ([place]) Continue with keppra and lacosamide.   Depression, continue with venlafaxine.   Ischemic stroke ([place]) Continue anticoagulation with apixaban. Continue blood pressure control.    Essential hypertension Continue blood pressure control with metoprolol, and isosorbide.  Diuresis with spironolactone and furosemide.   Gout attack Continue allopurinol for hyperuricemia.  Arthritis has improved with 3 days of prednisone.   Morbid obesity ([place])  Calculated BMI is 46 consistent with obesity class 3.         Subjective: Patient is feeling better, edema is improving, positive cramps on his right lower extremity at the level of the foot. No chest pain.    Physical Exam: Vitals:   10/30/22 0016 10/30/22 0404 10/30/22 0715 10/30/22 0757  BP: 138/88 119/82 (!) 135/92   Pulse: 78 67 68 75  Resp: 20 20 20 18   Temp: (!) 97.5 F (36.4 C) 97.8 F (36.6 C) 97.6 F (36.4 C)   TempSrc: Oral Oral Oral   SpO2: 94% 93% 92% 94%  Weight: (!) 161.9 kg     Height:       Neurology awake and alert ENT with  mild pallor Cardiovascular with S1 and S2 present and rhythmic with no gallops, rubs or murmurs No JVD Lower extremity edema + Respiratory with no rales or wheezing on anterior auscultation  Abdomen with no distention  Data Reviewed:    Family Communication: no family at the bedside   Disposition: Status is: Inpatient Remains inpatient appropriate because: IV furosemide   Planned Discharge Destination: Home      Author: Tawni Millers, MD 10/30/2022 12:02 PM  For on call review [email or web address].

## 2022-10-31 ENCOUNTER — Ambulatory Visit: Payer: No Typology Code available for payment source | Admitting: Endocrinology

## 2022-10-31 ENCOUNTER — Other Ambulatory Visit (HOSPITAL_COMMUNITY): Payer: Self-pay

## 2022-10-31 DIAGNOSIS — E876 Hypokalemia: Secondary | ICD-10-CM | POA: Diagnosis not present

## 2022-10-31 DIAGNOSIS — I5033 Acute on chronic diastolic (congestive) heart failure: Secondary | ICD-10-CM | POA: Diagnosis not present

## 2022-10-31 DIAGNOSIS — I480 Paroxysmal atrial fibrillation: Secondary | ICD-10-CM | POA: Diagnosis not present

## 2022-10-31 DIAGNOSIS — I10 Essential (primary) hypertension: Secondary | ICD-10-CM | POA: Diagnosis not present

## 2022-10-31 LAB — BASIC METABOLIC PANEL
Anion gap: 8 (ref 5–15)
BUN: 16 mg/dL (ref 8–23)
CO2: 27 mmol/L (ref 22–32)
Calcium: 8.6 mg/dL — ABNORMAL LOW (ref 8.9–10.3)
Chloride: 99 mmol/L (ref 98–111)
Creatinine, Ser: 0.89 mg/dL (ref 0.61–1.24)
GFR, Estimated: >60 mL/min (ref >60)
Glucose, Bld: 119 mg/dL — ABNORMAL HIGH (ref 70–99)
Potassium: 3.8 mmol/L (ref 3.5–5.1)
Sodium: 134 mmol/L — ABNORMAL LOW (ref 135–145)

## 2022-10-31 LAB — GLUCOSE, CAPILLARY
Glucose-Capillary: 140 mg/dL — ABNORMAL HIGH (ref 70–99)
Glucose-Capillary: 144 mg/dL — ABNORMAL HIGH (ref 70–99)

## 2022-10-31 LAB — MAGNESIUM: Magnesium: 2.2 mg/dL (ref 1.7–2.4)

## 2022-10-31 MED ORDER — DICLOFENAC SODIUM 1 % EX GEL
4.0000 g | Freq: Four times a day (QID) | CUTANEOUS | Status: DC
  Administered 2022-10-31: 4 g via TOPICAL
  Filled 2022-10-31: qty 100

## 2022-10-31 MED ORDER — TORSEMIDE 20 MG PO TABS
80.0000 mg | ORAL_TABLET | Freq: Two times a day (BID) | ORAL | 0 refills | Status: DC
  Filled 2022-10-31: qty 240, 30d supply, fill #0

## 2022-10-31 MED ORDER — LOSARTAN POTASSIUM 25 MG PO TABS
25.0000 mg | ORAL_TABLET | Freq: Every day | ORAL | Status: DC
  Filled 2022-10-31: qty 1

## 2022-10-31 MED ORDER — LOSARTAN POTASSIUM 25 MG PO TABS: 25.0000 mg | ORAL_TABLET | Freq: Every day | ORAL | Status: DC

## 2022-10-31 MED ORDER — DICLOFENAC SODIUM 1 % EX GEL
4.0000 g | Freq: Four times a day (QID) | CUTANEOUS | 0 refills | Status: DC
  Filled 2022-10-31: qty 100, 7d supply, fill #0

## 2022-10-31 MED ORDER — LOSARTAN POTASSIUM 25 MG PO TABS
25.0000 mg | ORAL_TABLET | Freq: Every day | ORAL | 0 refills | Status: DC
  Filled 2022-10-31: qty 30, 30d supply, fill #0

## 2022-10-31 MED ORDER — OXYCODONE HCL 5 MG PO TABS
5.0000 mg | ORAL_TABLET | ORAL | Status: DC | PRN
  Administered 2022-10-31: 5 mg via ORAL
  Filled 2022-10-31: qty 1

## 2022-10-31 NOTE — Care Management Important Message (Signed)
Important Message  Patient Details  Name: Lawrence [name] MRN: [id number] Date of Birth: April 05, 1956   Medicare Important Message Given:  Yes     Shelda Altes 10/31/2022, 8:49 AM

## 2022-10-31 NOTE — TOC Transition Note (Addendum)
Transition of Care [facility]) - CM/SW Discharge Note   Patient Details  Name: Lawrence [name] MRN: [id number] Date of Birth: [birth year]  [facility] CM/SW Contact:  Zenon Mayo, RN Phone Number: 10/31/2022, 11:36 AM   Clinical Narrative:    Patient is for dc today, NCM offered choice for HHPT, [place], he states he has had [facility] before and would like them again, NCM made referral to [facility] with [place], she is able to take referral. Soc will begin 24 to 48 hrs post dc.  Patient states he needs ambulance transport home. NCM set up transport for 3pm with [facility].    Final next level of care: Home w Home Health Services Barriers to Discharge: No Barriers Identified   Patient Goals and CMS Choice CMS Medicare.gov Compare Post Acute Care list provided to:: Patient Choice offered to / list presented to : Patient  Discharge Placement                         Discharge Plan and Services Additional resources added to the After Visit Summary for   In-house Referral: NA Discharge Planning Services: CM Consult Post Acute Care Choice: NA          DME Arranged: N/A DME Agency: NA       HH Arranged: PT, OT HH Agency: [place] Date [place]: 10/31/22 Time [place]: [id number] Representative spoke with at [place]: [place] Determinants of Health ([place]) Interventions SDOH Screenings   Food Insecurity: No Food Insecurity (10/26/2022)  Housing: Low Risk  (10/26/2022)  Transportation Needs: No Transportation Needs (10/26/2022)  Utilities: Not At Risk (10/26/2022)  Alcohol Screen: Low Risk  (06/08/2021)  Financial Resource Strain: Medium Risk (06/08/2021)  Tobacco Use: Medium Risk (10/26/2022)     Readmission Risk Interventions    08/12/2021   10:14 AM 08/12/2021   10:12 AM 02/11/2021    1:34 PM  Readmission Risk Prevention Plan  Transportation Screening Complete Complete Complete  Medication Review Press photographer) Complete  Complete Complete  PCP or Specialist appointment within 3-5 days of discharge Complete    HRI or [place] Complete  Complete  SW Recovery Care/Counseling Consult Complete  Complete  Palliative Care Screening Not Applicable  Not [place] Not Applicable  Not Applicable

## 2022-10-31 NOTE — Discharge Summary (Addendum)
Physician Discharge Summary   Patient: Lawrence [name]: [id number] DOB: 01/15/1956  Admit date:     10/26/2022  Discharge date: 10/31/22  Discharge Physician: Jimmy Picket Cay Kath   PCP: [name], PA-C   Recommendations at discharge:    Torsemide increased to 80 mg po bid.  Added low dose losartan and discontinued isosorbide to prevent hypotension.  Follow up renal function and electrolytes in March 28 at 2:15 pm.   Follow up with [name] PA-C in 7 to 10 days.  Follow up with heart failure clinic April 12 at 11:00 am   Discharge Diagnoses: Active Problems:   Acute on chronic diastolic CHF (congestive heart failure) (HCC)   AF (paroxysmal atrial fibrillation) (HCC)   Hypokalemia   COPD (chronic obstructive pulmonary disease) (HCC)   Type 2 diabetes mellitus with hyperlipidemia (HCC)   Seizure ([place])   Ischemic stroke ([place])   Essential hypertension   Gout attack   Morbid obesity ([place])  Resolved Problems:   * No resolved hospital problems. [facility] Course: Lawrence [name] was admitted to the hospital with the working diagnosis of heart failure exacerbation.   67 yo male with the past medical history of heart failure, moderate aortic stenosis, CVA with left residual weakness, hypertension, atrial fibrillation and obesity who presented with dyspnea and weakness. Reported 5 to 6 days of dyspnea on exertion, associated with cough and chest pain. Positive lower extremity edema. The night prior patient had significant orthopnea and PND prompting him to come to the ED. On his initial physical examination his blood pressure was 130/90, HR 77, RR 15 and 02 saturation 94%, lungs with rales bilaterally and increased work of breathing, heart with S1 and S2 present and rhythmic, abdomen with no distention and positive lower extremity edema. Bilateral lower extremity rash, warm to the touch and tender to palpation more right than left.   Na 139, K 3,6 Cl 99, bicarbonate 28  glucose 106 bun 15 cr 0,93  High sensitive troponin 7  Wbc 13.4 hgb 14,6 plt 251  Sars covid 19 negative  Urine analysis SG 1,011, negative protein, glucose >500   Chest radiograph with cardiomegaly with bilateral hilar vascular congestion and cephalization of the vasculature.   EKG 81 bpm, normal axis, 1st degree AV block, sinus rhythm with no significant ST segment or T wave changes.   03/22 patient placed on IV furosemide with improvement in volume status, but continue hypervolemic.  03/23 patient is responding well to diuresis.  03/24 continue to respond well to diuresis, but not yet back to baseline he has 02 desaturation on room air.  03/25 patient has responded well to diuresis, plan to continue oral diuretic therapy at home and follow up with cardiology as outpatient.    Assessment and Plan: Acute on chronic diastolic CHF (congestive heart failure) ([place]) 05/2022 echocardiogram with preserved LV systolic function EF 60 to 65%, mild dilatated LV cavity, RV systolic function is preserved, LA with severe dilatation, moderate aortic stenosis.   Patient is sp cardiomems implantation (2023), follow up with heart failure clinic.   Patient was placed on IV furosemide for diuresis, negative fluid balance was achieved, -10,346 ml, with significant improvement in his symptoms.  Systolic blood pressure [id number] to 129 mmHg.   Plan to continue medical therapy with spironolactone and metoprolol.  Continue with empagliflozin Add losartan for ARB.   Symmetrical bilateral lower extremity rash, possible due to peripheral vascular  Ruled out cellulitis.   AF (paroxysmal atrial fibrillation) (  [place]) Continue rate control with diltiazem and metoprolol.  Continue anticoagulation with apixaban.   Hypokalemia Hyponatremia.   Patient responded well to diuresis, renal function at the time of his discharge with serum cr at 0,89, K is 3,8 and serum bicarbonate at 27.  Na 134 BUN 16.  Continue diuresis  with torsemide and spironolactone.  Empagliflozin.  Continue with K supplementation as outpatient, will need close follow up renal function and electrolytes as outpatient.   Essential hypertension Continue blood pressure control with metoprolol. Considering diastolic heart failure will discontinue isosorbide and will place patient on losartan.  Continue spironolactone and torsemide.  Will need outpatient follow up BMP next week.   COPD (chronic obstructive pulmonary disease) (HCC) No signs of acute exacerbation Continue 02 monitoring.   Type 2 diabetes mellitus with hyperlipidemia (HCC) Glucose has been stable. Patient was treated with basal insulin and sliding scale during his hospitalization.  Patient will resume diabetic regimen with dapagliflozin, insulin aspart, metformin, repaglanide, tirzepatide, tresiba injections 40 units daily.   Continue with statin therapy.   Seizure ([place]) Continue with keppra and lacosamide.   Depression, continue with venlafaxine.   Ischemic stroke ([place]) Continue anticoagulation with apixaban. Continue blood pressure control.    Gout attack Continue allopurinol for hyperuricemia.  Feet arthritis has improved with 3 days of prednisone.  He has right knee pain, likely osteoarthritis, added topical diclofenac.    Class 3 obesity (HCC) Calculated BMI is 46 consistent with obesity class 3.          Consultants: none  Procedures performed: none   Disposition: Home Diet recommendation:  Cardiac and Carb modified diet DISCHARGE MEDICATION: Allergies as of 10/31/2022       Reactions   Vancomycin Other (See Comments)   "Red Man Syndrome" 02/02/17: possible cause for rash under both arms   Niacin And Related Other (See Comments)   Red man syndrome   Tubersol [tuberculin, Ppd] Other (See Comments)   Reaction unknown   Doxycycline Rash, Other (See Comments)        Medication List     STOP taking these medications    isosorbide  mononitrate 30 MG 24 hr tablet Commonly known as: IMDUR       TAKE these medications    acetaminophen 650 MG CR tablet Commonly known as: TYLENOL Take 1,300 mg by mouth every 8 (eight) hours as needed for pain.   albuterol 108 (90 Base) MCG/ACT inhaler Commonly known as: VENTOLIN HFA Inhale 2 puffs into the lungs every 6 (six) hours as needed for wheezing or shortness of breath.   allopurinol 100 MG tablet Commonly known as: ZYLOPRIM Take 200 mg by mouth 2 (two) times daily.   ARIPiprazole 5 MG tablet Commonly known as: ABILIFY TAKE ONE TABLET BY MOUTH EVERYDAY AT BEDTIME What changed:  how much to take how to take this when to take this additional instructions   atorvastatin 40 MG tablet Commonly known as: LIPITOR Take 1 tablet (40 mg total) by mouth daily. What changed: when to take this   B-D ULTRAFINE III SHORT PEN 31G X 8 MM Misc Generic drug: Insulin Pen Needle Use for pen   busPIRone 10 MG tablet Commonly known as: BUSPAR Take 1 tablet (10 mg total) by mouth 3 (three) times daily.   cetirizine 10 MG tablet Commonly known as: ZYRTEC Take 10 mg by mouth at bedtime.   dapagliflozin propanediol 10 MG Tabs tablet Commonly known as: Farxiga TAKE ONE TABLET BY MOUTH BEFORE BREAKFAST What  changed:  how much to take how to take this when to take this additional instructions   diclofenac Sodium 1 % Gel Commonly known as: VOLTAREN Apply 4 g topically 4 (four) times daily. To right knee.   diltiazem 120 MG 24 hr capsule Commonly known as: CARDIZEM CD TAKE ONE CAPSULE BY MOUTH ONCE DAILY   Eliquis 5 MG Tabs tablet Generic drug: apixaban TAKE ONE TABLET BY MOUTH TWICE DAILY What changed: how much to take   fenofibrate 145 MG tablet Commonly known as: Tricor Take 1 tablet (145 mg total) by mouth daily.   ferrous sulfate 324 (65 Fe) MG Tbec Take 324 mg by mouth daily with breakfast.   fluticasone 50 MCG/ACT nasal spray Commonly known as:  FLONASE Place 2 sprays into both nostrils daily.   Lacosamide 150 MG Tabs Take 1 tablet (150 mg total) by mouth 2 (two) times daily.   levETIRAcetam 500 MG tablet Commonly known as: KEPPRA Take 1 tablet (500 mg total) by mouth 2 (two) times daily.   losartan 25 MG tablet Commonly known as: COZAAR Take 1 tablet (25 mg total) by mouth daily. Start taking on: November 01, 2022   metFORMIN 500 MG 24 hr tablet Commonly known as: GLUCOPHAGE-XR Take 4 tablets (2,000 mg total) by mouth daily. What changed:  how much to take when to take this   metoprolol succinate 25 MG 24 hr tablet Commonly known as: TOPROL-XL Take 1 tablet (25 mg total) by mouth daily.   mirtazapine 15 MG tablet Commonly known as: REMERON TAKE ONE TABLET BY MOUTH EVERYDAY AT BEDTIME What changed: See the new instructions.   NovoLOG FlexPen 100 UNIT/ML FlexPen Generic drug: insulin aspart 10 units before meals, keep sugar 2 hours after meals under 180 at least What changed:  how much to take how to take this when to take this additional instructions   omeprazole 20 MG capsule Commonly known as: PRILOSEC Take 20 mg by mouth every morning.   potassium chloride SA 20 MEQ tablet Commonly known as: KLOR-CON M TAKE TWO TABLETS BY MOUTH TWICE DAILY   prazosin 2 MG capsule Commonly known as: MINIPRESS Take two tablets at bedtime. What changed:  how much to take how to take this when to take this additional instructions   pregabalin 150 MG capsule Commonly known as: LYRICA Take 1 capsule (150 mg total) by mouth 3 (three) times daily.   repaglinide 2 MG tablet Commonly known as: PRANDIN TAKE TWO TABLETS BY MOUTH TWICE DAILY BEFORE A meal What changed: See the new instructions.   spironolactone 25 MG tablet Commonly known as: ALDACTONE Take 0.5 tablets (12.5 mg total) by mouth daily.   tirzepatide 15 MG/0.5ML Pen Commonly known as: MOUNJARO Inject 15 mg into the skin once a week. What changed: when  to take this   torsemide 20 MG tablet Commonly known as: DEMADEX Take 4 tablets (80 mg total) by mouth 2 (two) times daily. What changed: See the new instructions.   traZODone 100 MG tablet Commonly known as: DESYREL Take 200 mg by mouth at bedtime.   Trelegy Ellipta 200-62.5-25 MCG/ACT Aepb Generic drug: Fluticasone-Umeclidin-Vilant INHALE 1 PUFF BY MOUTH INTO LUNGS ONCE DAILY   Tresiba FlexTouch 100 UNIT/ML FlexTouch Pen Generic drug: insulin degludec INJECT 40 UNITS into THE SKIN ONCE DAILY What changed: See the new instructions.   Vascepa 1 g capsule Generic drug: icosapent Ethyl TAKE TWO CAPSULES BY MOUTH TWICE DAILY   venlafaxine XR 75 MG 24 hr capsule Commonly  known as: EFFEXOR-XR Take 3 capsules (225 mg total) by mouth daily with breakfast.   VITAMIN D3 PO Take 2,000 Units by mouth daily.        Follow-up Information     Schedule an appointment as soon as possible for a visit  with [facility] with your PCP/Specialist as discussed.   Contact information: [email or web address] Call our physician referral line at [phone].               Discharge Exam: Filed Weights   10/29/22 0001 10/30/22 0016 10/31/22 0420  Weight: (!) 163.2 kg (!) 161.9 kg (!) 161.9 kg   BP 131/76 (BP Location: Left Wrist)   Pulse 75   Temp 98 F (36.7 C) (Oral)   Resp 20   Ht 6\' 2"  (1.88 m)   Wt (!) 161.9 kg   SpO2 91%   BMI 45.84 kg/m   Patient is feeling better, dyspnea has improved, edema has improved, he has right knee pain today.   Neurology awake and alert ENT with mild pallor Cardiovascular with S1 and S2 present and regular with no gallops, rubs or murmurs No JVD Lower extremity edema has resolved.  Respiratory with no wheezing or rales, no rhonchi Abdomen with no distention   Condition at discharge: stable  The results of significant diagnostics from this hospitalization (including imaging, microbiology, ancillary and laboratory)  are listed below for reference.   Imaging Studies: CT Angio Chest Pulmonary Embolism (PE) W or WO Contrast  Result Date: 10/26/2022 CLINICAL DATA:  PE EXAM: CT ANGIOGRAPHY CHEST WITH CONTRAST TECHNIQUE: Multidetector CT imaging of the chest was performed using the standard protocol during bolus administration of intravenous contrast. Multiplanar CT image reconstructions and MIPs were obtained to evaluate the vascular anatomy. RADIATION DOSE REDUCTION: This exam was performed according to the departmental dose-optimization program which includes automated exposure control, adjustment of the mA and/or kV according to patient size and/or use of iterative reconstruction technique. CONTRAST:  66mL OMNIPAQUE IOHEXOL 350 MG/ML SOLN COMPARISON:  09/09/2019. FINDINGS: Cardiovascular: Enlarged heart. Atheromatous changes aorta and coronary arteries. No filling defects in pulmonary arteries to indicate PE. No aortic aneurysm. No pericardial effusion. There is some reflux of contrast in the IVC consistent with tricuspid insufficiency. Mediastinum/Nodes: No enlarged mediastinal, hilar, or axillary lymph nodes. Thyroid gland, trachea, and esophagus demonstrate no significant findings. Lungs/Pleura: Small bilateral pleural effusions. Ground-glass opacities in both lungs especially in the dependent portions consistent with pneumonitis atelectasis or pulmonary edema. No pneumothorax. Trace pleural effusions. Upper Abdomen: No acute process identified. There is a partially imaged hypodensity in the liver presumably cysts measuring at least 3.7 cm. Musculoskeletal: Thoracic degenerative changes identified. Review of the MIP images confirms the above findings. IMPRESSION: 1. No evidence of PE. 2. Findings are consistent with CHF. Electronically Signed   By: Sammie Bench M.D.   On: 10/26/2022 16:19   DG Chest Portable 1 View  Result Date: 10/26/2022 CLINICAL DATA:  Shortness of breath. EXAM: PORTABLE CHEST 1 VIEW  COMPARISON:  March 09, 2022. FINDINGS: Stable cardiomediastinal silhouette. Minimal bibasilar subsegmental atelectasis is noted. Bony thorax is unremarkable. IMPRESSION: Minimal bibasilar subsegmental atelectasis. Electronically Signed   By: Marijo Conception M.D.   On: 10/26/2022 13:37   VAS [place] LOWER EXTREMITY VENOUS REFLUX  Result Date: 10/19/2022  Lower Venous Reflux Study Patient Name:  BERNETT KOMARA  Date of Exam:   10/19/2022 Medical Rec #: [id number]         Accession #:    [id number]  Date of Birth: 08-Jan-1956         Patient Gender: M Patient Age:   89 years Exam Location:  [facility] Procedure:      VAS [place] LOWER EXTREMITY VENOUS REFLUX Referring Phys: Harrell Gave DICKSON --------------------------------------------------------------------------------  Indications: Edema.  Performing Technologist: Alvia Grove RVT  Examination Guidelines: A complete evaluation includes B-mode imaging, spectral Doppler, color Doppler, and power Doppler as needed of all accessible portions of each vessel. Bilateral testing is considered an integral part of a complete examination. Limited examinations for reoccurring indications may be performed as noted. The reflux portion of the exam is performed with the patient in reverse Trendelenburg. Significant venous reflux is defined as >500 ms in the superficial venous system, and >1 second in the deep venous system.  Venous Reflux Times +-----+---------+------+-----------+------------+--------+ RIGHTReflux NoRefluxReflux TimeDiameter cmsComments                Yes                                  +-----+---------+------+-----------+------------+--------+                                    0.41             +-----+---------+------+-----------+------------+--------+  +--------------+---------+------+-----------+------------+--------+ LEFT          Reflux NoRefluxReflux TimeDiameter cmsComments                         Yes                                   +--------------+---------+------+-----------+------------+--------+ CFV                     yes   >1 second                      +--------------+---------+------+-----------+------------+--------+ FV mid        no                                             +--------------+---------+------+-----------+------------+--------+ Popliteal     no                                             +--------------+---------+------+-----------+------------+--------+ GSV at SFJ              yes    >500 ms      0.84             +--------------+---------+------+-----------+------------+--------+ GSV prox thighno        yes    >500 ms      0.71             +--------------+---------+------+-----------+------------+--------+ GSV mid thigh           yes    >500 ms      0.58             +--------------+---------+------+-----------+------------+--------+ GSV dist thighno  0.54             +--------------+---------+------+-----------+------------+--------+ GSV at knee   no                            0.54             +--------------+---------+------+-----------+------------+--------+ GSV prox calf no                            0.59             +--------------+---------+------+-----------+------------+--------+ GSV mid calf  no                            0.48             +--------------+---------+------+-----------+------------+--------+ GSV dist calf           yes    >500 ms      0.49             +--------------+---------+------+-----------+------------+--------+ SSV Pop Fossa           yes    >500 ms      0.51             +--------------+---------+------+-----------+------------+--------+ SSV prox calf           yes    >500 ms      0.42             +--------------+---------+------+-----------+------------+--------+ SSV mid calf            yes    >500 ms      0.42              +--------------+---------+------+-----------+------------+--------+ AASV o                  yes    >500 ms      0.68             +--------------+---------+------+-----------+------------+--------+ AASV P                  yes    >500 ms      0.58             +--------------+---------+------+-----------+------------+--------+ AASV P-M      no                            0.41             +--------------+---------+------+-----------+------------+--------+   Summary: Left: - No evidence of deep vein thrombosis seen in the left lower extremity, from the common femoral through the popliteal veins. - No evidence of superficial venous reflux seen in the left short saphenous vein. - Venous reflux is noted in the left common femoral vein. - Venous reflux is noted in the left sapheno-femoral junction. - Venous reflux is noted in the left greater saphenous vein in the thigh. - Venous reflux is noted in the left greater saphenous vein in the calf. - Venous reflux is noted in the AASV.  *See table(s) above for measurements and observations. Electronically signed by [name] MD on 10/19/2022 at 3:43:52 PM.    Final     Microbiology: Results for orders placed or performed during the hospital encounter of 10/26/22  Resp panel by RT-PCR (RSV, Flu A&B, Covid) Anterior Nasal Swab     Status: None   Collection  Time: 10/26/22 12:57 PM   Specimen: Anterior Nasal Swab  Result Value Ref Range Status   SARS Coronavirus 2 by RT PCR NEGATIVE NEGATIVE Final   Influenza A by PCR NEGATIVE NEGATIVE Final   Influenza B by PCR NEGATIVE NEGATIVE Final    Comment: (NOTE) The Xpert Xpress SARS-CoV-2/FLU/RSV plus assay is intended as an aid in the diagnosis of influenza from Nasopharyngeal swab specimens and should not be used as a sole basis for treatment. Nasal washings and aspirates are unacceptable for Xpert Xpress SARS-CoV-2/FLU/RSV testing.  Fact Sheet for  Patients: EntrepreneurPulse.com.au  Fact Sheet for Healthcare Providers: IncredibleEmployment.be  This test is not yet approved or cleared by the [place] FDA and has been authorized for detection and/or diagnosis of SARS-CoV-2 by FDA under an Emergency Use Authorization (EUA). This EUA will remain in effect (meaning this test can be used) for the duration of the COVID-19 declaration under Section 564(b)(1) of the Act, 21 U.S.C. section 360bbb-3(b)(1), unless the authorization is terminated or revoked.     Resp Syncytial Virus by PCR NEGATIVE NEGATIVE Final    Comment: (NOTE) Fact Sheet for Patients: EntrepreneurPulse.com.au  Fact Sheet for Healthcare Providers: IncredibleEmployment.be  This test is not yet approved or cleared by the [place] FDA and has been authorized for detection and/or diagnosis of SARS-CoV-2 by FDA under an Emergency Use Authorization (EUA). This EUA will remain in effect (meaning this test can be used) for the duration of the COVID-19 declaration under Section 564(b)(1) of the Act, 21 U.S.C. section 360bbb-3(b)(1), unless the authorization is terminated or revoked.  Performed at [facility], [place] [address], [place], [place] [address]     Labs: CBC: Recent Labs  Lab 10/26/22 1250 10/30/22 0105  WBC 13.4* 11.9*  HGB 14.6 14.1  HCT 47.0 43.3  MCV 93.1 89.3  PLT 251 [id number]   Basic Metabolic Panel: Recent Labs  Lab 10/27/22 0029 10/28/22 0051 10/29/22 0027 10/30/22 0105 10/31/22 0056  NA 137 138 135 137 134*  K 3.3* 4.3 3.5 3.5 3.8  CL 103 103 100 102 99  CO2 27 27 24 26 27   GLUCOSE 124* 144* 119* 129* 119*  BUN 17 15 14 15 16   CREATININE 0.94 0.81 0.86 0.80 0.89  CALCIUM 8.4* 8.7* 8.6* 8.6* 8.6*  MG  --  2.3  --   --  2.2   Liver Function Tests: No results for input(s): "AST", "ALT", "ALKPHOS", "BILITOT", "PROT", "ALBUMIN" in the last 168  hours. CBG: Recent Labs  Lab 10/30/22 0556 10/30/22 1103 10/30/22 1649 10/30/22 2122 10/31/22 0549  GLUCAP 113* 204* 170* 133* 140*    Discharge time spent: greater than 30 minutes.  Signed: Tawni Millers, MD [facility] 10/31/2022

## 2022-11-01 ENCOUNTER — Other Ambulatory Visit: Payer: Self-pay | Admitting: *Deleted

## 2022-11-01 DIAGNOSIS — E1165 Type 2 diabetes mellitus with hyperglycemia: Secondary | ICD-10-CM | POA: Diagnosis not present

## 2022-11-01 DIAGNOSIS — I83812 Varicose veins of left lower extremities with pain: Secondary | ICD-10-CM

## 2022-11-01 MED ORDER — LORAZEPAM 1 MG PO TABS: ORAL_TABLET | ORAL | 0 refills | Status: DC

## 2022-11-02 DIAGNOSIS — I10 Essential (primary) hypertension: Secondary | ICD-10-CM | POA: Diagnosis not present

## 2022-11-02 DIAGNOSIS — E785 Hyperlipidemia, unspecified: Secondary | ICD-10-CM | POA: Diagnosis not present

## 2022-11-02 DIAGNOSIS — I480 Paroxysmal atrial fibrillation: Secondary | ICD-10-CM | POA: Diagnosis not present

## 2022-11-02 DIAGNOSIS — E1165 Type 2 diabetes mellitus with hyperglycemia: Secondary | ICD-10-CM | POA: Diagnosis not present

## 2022-11-02 DIAGNOSIS — M1990 Unspecified osteoarthritis, unspecified site: Secondary | ICD-10-CM | POA: Diagnosis not present

## 2022-11-02 DIAGNOSIS — G4700 Insomnia, unspecified: Secondary | ICD-10-CM | POA: Diagnosis not present

## 2022-11-03 ENCOUNTER — Other Ambulatory Visit (HOSPITAL_COMMUNITY): Payer: Medicare HMO

## 2022-11-04 ENCOUNTER — Encounter: Payer: Self-pay | Admitting: Endocrinology

## 2022-11-04 ENCOUNTER — Ambulatory Visit: Payer: Medicare HMO | Admitting: Podiatry

## 2022-11-04 DIAGNOSIS — F32A Depression, unspecified: Secondary | ICD-10-CM | POA: Diagnosis not present

## 2022-11-04 DIAGNOSIS — F419 Anxiety disorder, unspecified: Secondary | ICD-10-CM | POA: Diagnosis not present

## 2022-11-04 DIAGNOSIS — I110 Hypertensive heart disease with heart failure: Secondary | ICD-10-CM | POA: Diagnosis not present

## 2022-11-04 DIAGNOSIS — I5033 Acute on chronic diastolic (congestive) heart failure: Secondary | ICD-10-CM | POA: Diagnosis not present

## 2022-11-04 DIAGNOSIS — J4489 Other specified chronic obstructive pulmonary disease: Secondary | ICD-10-CM | POA: Diagnosis not present

## 2022-11-04 DIAGNOSIS — E1151 Type 2 diabetes mellitus with diabetic peripheral angiopathy without gangrene: Secondary | ICD-10-CM | POA: Diagnosis not present

## 2022-11-04 DIAGNOSIS — E1142 Type 2 diabetes mellitus with diabetic polyneuropathy: Secondary | ICD-10-CM | POA: Diagnosis not present

## 2022-11-04 DIAGNOSIS — E1169 Type 2 diabetes mellitus with other specified complication: Secondary | ICD-10-CM | POA: Diagnosis not present

## 2022-11-04 DIAGNOSIS — E7849 Other hyperlipidemia: Secondary | ICD-10-CM | POA: Diagnosis not present

## 2022-11-08 DIAGNOSIS — E1151 Type 2 diabetes mellitus with diabetic peripheral angiopathy without gangrene: Secondary | ICD-10-CM | POA: Diagnosis not present

## 2022-11-08 DIAGNOSIS — F32A Depression, unspecified: Secondary | ICD-10-CM | POA: Diagnosis not present

## 2022-11-08 DIAGNOSIS — F419 Anxiety disorder, unspecified: Secondary | ICD-10-CM | POA: Diagnosis not present

## 2022-11-08 DIAGNOSIS — I5033 Acute on chronic diastolic (congestive) heart failure: Secondary | ICD-10-CM | POA: Diagnosis not present

## 2022-11-08 DIAGNOSIS — J4489 Other specified chronic obstructive pulmonary disease: Secondary | ICD-10-CM | POA: Diagnosis not present

## 2022-11-08 DIAGNOSIS — I110 Hypertensive heart disease with heart failure: Secondary | ICD-10-CM | POA: Diagnosis not present

## 2022-11-08 DIAGNOSIS — E7849 Other hyperlipidemia: Secondary | ICD-10-CM | POA: Diagnosis not present

## 2022-11-08 DIAGNOSIS — E1169 Type 2 diabetes mellitus with other specified complication: Secondary | ICD-10-CM | POA: Diagnosis not present

## 2022-11-08 DIAGNOSIS — E1142 Type 2 diabetes mellitus with diabetic polyneuropathy: Secondary | ICD-10-CM | POA: Diagnosis not present

## 2022-11-09 ENCOUNTER — Other Ambulatory Visit: Payer: Medicare HMO | Admitting: Vascular Surgery

## 2022-11-10 ENCOUNTER — Other Ambulatory Visit: Payer: Self-pay | Admitting: *Deleted

## 2022-11-10 MED ORDER — LORAZEPAM 1 MG PO TABS: ORAL_TABLET | ORAL | 0 refills | Status: DC

## 2022-11-11 ENCOUNTER — Encounter (HOSPITAL_COMMUNITY): Payer: Self-pay | Admitting: Cardiology

## 2022-11-11 ENCOUNTER — Ambulatory Visit: Payer: Medicare HMO | Admitting: Podiatry

## 2022-11-11 NOTE — Telephone Encounter (Signed)
Pt confirmed he is on 10 mg dose via Mychart message.

## 2022-11-12 DIAGNOSIS — E1169 Type 2 diabetes mellitus with other specified complication: Secondary | ICD-10-CM | POA: Diagnosis not present

## 2022-11-12 DIAGNOSIS — F419 Anxiety disorder, unspecified: Secondary | ICD-10-CM | POA: Diagnosis not present

## 2022-11-12 DIAGNOSIS — I110 Hypertensive heart disease with heart failure: Secondary | ICD-10-CM | POA: Diagnosis not present

## 2022-11-12 DIAGNOSIS — E1142 Type 2 diabetes mellitus with diabetic polyneuropathy: Secondary | ICD-10-CM | POA: Diagnosis not present

## 2022-11-12 DIAGNOSIS — I5033 Acute on chronic diastolic (congestive) heart failure: Secondary | ICD-10-CM | POA: Diagnosis not present

## 2022-11-12 DIAGNOSIS — J4489 Other specified chronic obstructive pulmonary disease: Secondary | ICD-10-CM | POA: Diagnosis not present

## 2022-11-12 DIAGNOSIS — E1151 Type 2 diabetes mellitus with diabetic peripheral angiopathy without gangrene: Secondary | ICD-10-CM | POA: Diagnosis not present

## 2022-11-12 DIAGNOSIS — E7849 Other hyperlipidemia: Secondary | ICD-10-CM | POA: Diagnosis not present

## 2022-11-12 DIAGNOSIS — F32A Depression, unspecified: Secondary | ICD-10-CM | POA: Diagnosis not present

## 2022-11-14 DIAGNOSIS — I5033 Acute on chronic diastolic (congestive) heart failure: Secondary | ICD-10-CM | POA: Diagnosis not present

## 2022-11-14 DIAGNOSIS — F32A Depression, unspecified: Secondary | ICD-10-CM | POA: Diagnosis not present

## 2022-11-14 DIAGNOSIS — E1142 Type 2 diabetes mellitus with diabetic polyneuropathy: Secondary | ICD-10-CM | POA: Diagnosis not present

## 2022-11-14 DIAGNOSIS — E1169 Type 2 diabetes mellitus with other specified complication: Secondary | ICD-10-CM | POA: Diagnosis not present

## 2022-11-14 DIAGNOSIS — E7849 Other hyperlipidemia: Secondary | ICD-10-CM | POA: Diagnosis not present

## 2022-11-14 DIAGNOSIS — E1151 Type 2 diabetes mellitus with diabetic peripheral angiopathy without gangrene: Secondary | ICD-10-CM | POA: Diagnosis not present

## 2022-11-14 DIAGNOSIS — F419 Anxiety disorder, unspecified: Secondary | ICD-10-CM | POA: Diagnosis not present

## 2022-11-14 DIAGNOSIS — I110 Hypertensive heart disease with heart failure: Secondary | ICD-10-CM | POA: Diagnosis not present

## 2022-11-14 DIAGNOSIS — J4489 Other specified chronic obstructive pulmonary disease: Secondary | ICD-10-CM | POA: Diagnosis not present

## 2022-11-15 ENCOUNTER — Telehealth: Payer: Self-pay | Admitting: Cardiology

## 2022-11-15 NOTE — Telephone Encounter (Signed)
*  STAT* If patient is at the pharmacy, call can be transferred to refill team.   1. Which medications need to be refilled? (please list name of each medication and dose if known) spironolactone (ALDACTONE) 25 MG tablet   2. Which pharmacy/location (including street and city if local pharmacy) is medication to be sent to?  [facility] - [place], [place] - [place] [id number]    3. Do they need a 30 day or 90 day supply? 90

## 2022-11-16 ENCOUNTER — Encounter (HOSPITAL_COMMUNITY): Payer: Medicare HMO

## 2022-11-16 ENCOUNTER — Encounter: Payer: Self-pay | Admitting: Vascular Surgery

## 2022-11-16 ENCOUNTER — Encounter: Payer: Medicare HMO | Admitting: Vascular Surgery

## 2022-11-16 ENCOUNTER — Ambulatory Visit (INDEPENDENT_AMBULATORY_CARE_PROVIDER_SITE_OTHER): Payer: Medicare HMO | Admitting: Vascular Surgery

## 2022-11-16 VITALS — BP 109/66 | HR 90 | Temp 98.3°F | Resp 20 | Wt 357.0 lb

## 2022-11-16 DIAGNOSIS — I83812 Varicose veins of left lower extremities with pain: Secondary | ICD-10-CM | POA: Diagnosis not present

## 2022-11-16 DIAGNOSIS — I872 Venous insufficiency (chronic) (peripheral): Secondary | ICD-10-CM

## 2022-11-16 DIAGNOSIS — R6889 Other general symptoms and signs: Secondary | ICD-10-CM | POA: Diagnosis not present

## 2022-11-16 HISTORY — PX: ENDOVENOUS ABLATION SAPHENOUS VEIN W/ LASER: SUR449

## 2022-11-16 MED ORDER — SPIRONOLACTONE 25 MG PO TABS: 12.5000 mg | ORAL_TABLET | Freq: Every day | ORAL | 3 refills | Status: DC

## 2022-11-16 NOTE — Progress Notes (Signed)
Patient name: Lawrence [name] MRN: [id number] DOB: 17-Apr-1956 Sex: male  REASON FOR VISIT: For laser ablation of the left great saphenous vein  HPI: Lawrence [name] is a 67 y.o. male who I saw on 10/19/2022 with combined chronic venous insufficiency and lymphedema.  He has CEAP C6 venous disease.  He had an ulcer on the lateral aspect of his left leg.  He has failed conservative treatment.  Based on his venous duplex scan on that day and my exam I felt that he would be a candidate for laser ablation of the left great saphenous vein down to the mid thigh and also staged laser ablation of the left anterior accessory saphenous vein which was also markedly dilated.  There was also reflux in the small saphenous vein however the vein was not especially dilated.  He presents for laser ablation of the left great saphenous vein  Current Outpatient Medications  Medication Sig Dispense Refill   acetaminophen (TYLENOL) 650 MG CR tablet Take 1,300 mg by mouth every 8 (eight) hours as needed for pain.     albuterol (VENTOLIN HFA) 108 (90 Base) MCG/ACT inhaler Inhale 2 puffs into the lungs every 6 (six) hours as needed for wheezing or shortness of breath. 1 each 6   allopurinol (ZYLOPRIM) 100 MG tablet Take 200 mg by mouth 2 (two) times daily.     ARIPiprazole (ABILIFY) 5 MG tablet TAKE ONE TABLET BY MOUTH EVERYDAY AT BEDTIME (Patient taking differently: Take 5 mg by mouth at bedtime.) 90 tablet 3   atorvastatin (LIPITOR) 40 MG tablet Take 1 tablet (40 mg total) by mouth daily. (Patient taking differently: Take 40 mg by mouth at bedtime.) 90 tablet 3   busPIRone (BUSPAR) 10 MG tablet Take 1 tablet (10 mg total) by mouth 3 (three) times daily. 270 tablet 3   cetirizine (ZYRTEC) 10 MG tablet Take 10 mg by mouth at bedtime.      Cholecalciferol (VITAMIN D3 PO) Take 2,000 Units by mouth daily.     dapagliflozin propanediol (FARXIGA) 10 MG TABS tablet TAKE ONE TABLET BY MOUTH BEFORE BREAKFAST (Patient taking  differently: Take 10 mg by mouth daily before breakfast.) 90 tablet 3   diclofenac Sodium (VOLTAREN) 1 % GEL Apply 4 g topically 4 (four) times daily. To right knee. 100 g 0   diltiazem (CARDIZEM CD) 120 MG 24 hr capsule TAKE ONE CAPSULE BY MOUTH ONCE DAILY 30 capsule 2   ELIQUIS 5 MG TABS tablet TAKE ONE TABLET BY MOUTH TWICE DAILY (Patient taking differently: Take 5 mg by mouth 2 (two) times daily.) 180 tablet 2   fenofibrate (TRICOR) 145 MG tablet Take 1 tablet (145 mg total) by mouth daily. 90 tablet 3   ferrous sulfate 324 (65 Fe) MG TBEC Take 324 mg by mouth daily with breakfast.      fluticasone (FLONASE) 50 MCG/ACT nasal spray Place 2 sprays into both nostrils daily.      Fluticasone-Umeclidin-Vilant (TRELEGY ELLIPTA) 200-62.5-25 MCG/ACT AEPB INHALE 1 PUFF BY MOUTH INTO LUNGS ONCE DAILY 60 each 11   insulin aspart (NOVOLOG FLEXPEN) 100 UNIT/ML FlexPen 10 units before meals, keep sugar 2 hours after meals under 180 at least (Patient taking differently: Inject 10 Units into the skin See admin instructions. Three times before meals, keep sugar 2 hours after meals under 180 at least) 15 mL 1   Insulin Pen Needle (B-D ULTRAFINE III SHORT PEN) 31G X 8 MM MISC Use for pen 100 each 3   Lacosamide  150 MG TABS Take 1 tablet (150 mg total) by mouth 2 (two) times daily. 180 tablet 3   levETIRAcetam (KEPPRA) 500 MG tablet Take 1 tablet (500 mg total) by mouth 2 (two) times daily. 180 tablet 3   LORazepam (ATIVAN) 1 MG tablet Take 2 tablets 30 minutes prior to leaving house on day of office surgery.  Bring third tablet with you to office on day of office surgery. 3 tablet 0   LORazepam (ATIVAN) 1 MG tablet Take 2 tablets thirty minutes prior to leaving house on day of office surgery.  Bring third tablet with you to office on day of office surgery. 3 tablet 0   losartan (COZAAR) 25 MG tablet Take 1 tablet (25 mg total) by mouth daily. 30 tablet 0   metFORMIN (GLUCOPHAGE-XR) 500 MG 24 hr tablet Take 4 tablets  (2,000 mg total) by mouth daily. (Patient taking differently: Take 1,000 mg by mouth 2 (two) times daily with a meal.) 360 tablet 3   metoprolol succinate (TOPROL-XL) 25 MG 24 hr tablet Take 1 tablet (25 mg total) by mouth daily. 90 tablet 1   mirtazapine (REMERON) 15 MG tablet TAKE ONE TABLET BY MOUTH EVERYDAY AT BEDTIME (Patient taking differently: Take 15 mg by mouth at bedtime.) 30 tablet 2   omeprazole (PRILOSEC) 20 MG capsule Take 20 mg by mouth every morning.     potassium chloride SA (KLOR-CON M) 20 MEQ tablet TAKE TWO TABLETS BY MOUTH TWICE DAILY 270 tablet 3   prazosin (MINIPRESS) 2 MG capsule Take two tablets at bedtime. (Patient taking differently: Take 4 mg by mouth at bedtime.) 180 capsule 3   pregabalin (LYRICA) 150 MG capsule Take 1 capsule (150 mg total) by mouth 3 (three) times daily. 270 capsule 3   repaglinide (PRANDIN) 2 MG tablet TAKE TWO TABLETS BY MOUTH TWICE DAILY BEFORE A meal (Patient taking differently: Take 4 mg by mouth 2 (two) times daily before a meal.) 120 tablet 0   spironolactone (ALDACTONE) 25 MG tablet Take 0.5 tablets (12.5 mg total) by mouth daily. 45 tablet 3   tirzepatide (MOUNJARO) 15 MG/0.5ML Pen Inject 15 mg into the skin once a week. (Patient taking differently: Inject 15 mg into the skin every Monday.) 2 mL 1   torsemide (DEMADEX) 20 MG tablet Take 4 tablets (80 mg total) by mouth 2 (two) times daily. 240 tablet 0   traZODone (DESYREL) 100 MG tablet Take 200 mg by mouth at bedtime.     TRESIBA FLEXTOUCH 100 UNIT/ML FlexTouch Pen INJECT 40 UNITS into THE SKIN ONCE DAILY (Patient taking differently: Inject 40 Units into the skin daily.) 15 mL 3   VASCEPA 1 g capsule TAKE TWO CAPSULES BY MOUTH TWICE DAILY 120 capsule 5   venlafaxine XR (EFFEXOR-XR) 75 MG 24 hr capsule Take 3 capsules (225 mg total) by mouth daily with breakfast. 270 capsule 3   No current facility-administered medications for this visit.    PHYSICAL EXAM: Vitals:   11/16/22 1106  BP:  109/66  Pulse: 90  Resp: 20  Temp: 98.3 F (36.8 C)  TempSrc: Temporal  SpO2: 90%  Weight: (!) 357 lb (161.9 kg)    PROCEDURE: Laser ablation left great saphenous vein  TECHNIQUE: The patient was taken to the exam room and placed supine on the table.  I looked at the left great saphenous vein myself with the SonoSite.  I felt that I could cannulate this in the distal thigh.  Left leg was prepped and draped  in usual sterile fashion.  Under ultrasound guidance, after the skin was anesthetized, I cannulated the left great saphenous vein with a micropuncture needle and a micropuncture sheath was introduced over a wire.  I then advanced the J-wire to just below the saphenofemoral junction.  Laser fiber was positioned at the end of the sheath and the sheath retracted.  I position is 2.5 cm distal to the saphenofemoral junction.  Tumescent anesthesia was administered circumferentially around the vein.  Next laser ablation was performed of the left great saphenous vein from 2 and half centimeters distal to the saphenofemoral junction to the distal thigh.  50 J/cm was used at 7 W.  The patient tolerated the procedure well.  Pressure dressing was applied.  The patient will return in 2 weeks for follow-up ultrasound.  Waverly Ferrari [facility] [phone]

## 2022-11-16 NOTE — Progress Notes (Signed)
     Laser Ablation Procedure    Date: 11/16/2022   [name] DOB:1955/10/16  Consent signed: Yes      Surgeon: Cari Caraway MD   Procedure: Laser Ablation: left Greater Saphenous Vein  BP 109/66 (BP Location: Right Arm, Patient Position: Sitting, Cuff Size: Large)   Pulse 90   Temp 98.3 F (36.8 C) (Temporal)   Resp 20   Wt (!) 357 lb (161.9 kg)   SpO2 90%   BMI 45.84 kg/m   Tumescent Anesthesia: 475 cc 0.9% NaCl with 50 cc Lidocaine HCL 1%  and 15 cc 8.4% NaHCO3  Local Anesthesia: 10 cc Lidocaine HCL and NaHCO3 (ratio 2:1)  7 watts continuous mode     Total energy: 1696.7 Joules    Total time: 242 seconds Treatment Length 35 cm   Laser Fiber Ref. #  [id number]   Lot #  [id number]     Patient tolerated procedure well  Notes: All staff members wore facial masks.  Last dose of Eliquis was on 11-13-2022.    Ativan 1 mg (2 tablets) taken on 11-16-2022 at 10:20 AM.    Description of Procedure:  After marking the course of the secondary varicosities, the patient was placed on the operating table in the supine position, and the left leg was prepped and draped in sterile fashion.   Local anesthetic was administered and under ultrasound guidance the saphenous vein was accessed with a micro needle and guide wire; then the mirco puncture sheath was placed.  A guide wire was inserted saphenofemoral junction , followed by a 5 french sheath.  The position of the sheath and then the laser fiber below the junction was confirmed using the ultrasound.  Tumescent anesthesia was administered along the course of the saphenous vein using ultrasound guidance. The patient was placed in Trendelenburg position and protective laser glasses were placed on patient and staff, and the laser was fired at 7 watts continuous mode for a total of 1696.7 joules.       Steri strip was applied to the IV insertion site and ABD pads and knee high compression stockings were applied.  Ace wrap bandages  were applied over the left thigh and at the top of the saphenofemoral junction. Blood loss was less than 15 cc.  Discharge instructions reviewed with patient and hardcopy of discharge instructions given to patient to take home. The patient drove motorized scooter out of the operating room having tolerated the procedure well.

## 2022-11-16 NOTE — Telephone Encounter (Signed)
90 day refill for Spironolactone has been sent to [facility].

## 2022-11-18 ENCOUNTER — Inpatient Hospital Stay (HOSPITAL_COMMUNITY): Payer: Medicare HMO

## 2022-11-18 ENCOUNTER — Other Ambulatory Visit: Payer: Self-pay

## 2022-11-18 DIAGNOSIS — E7849 Other hyperlipidemia: Secondary | ICD-10-CM | POA: Diagnosis not present

## 2022-11-18 DIAGNOSIS — E1165 Type 2 diabetes mellitus with hyperglycemia: Secondary | ICD-10-CM

## 2022-11-18 DIAGNOSIS — E1169 Type 2 diabetes mellitus with other specified complication: Secondary | ICD-10-CM | POA: Diagnosis not present

## 2022-11-18 DIAGNOSIS — F419 Anxiety disorder, unspecified: Secondary | ICD-10-CM | POA: Diagnosis not present

## 2022-11-18 DIAGNOSIS — E1142 Type 2 diabetes mellitus with diabetic polyneuropathy: Secondary | ICD-10-CM | POA: Diagnosis not present

## 2022-11-18 DIAGNOSIS — I5033 Acute on chronic diastolic (congestive) heart failure: Secondary | ICD-10-CM | POA: Diagnosis not present

## 2022-11-18 DIAGNOSIS — F32A Depression, unspecified: Secondary | ICD-10-CM | POA: Diagnosis not present

## 2022-11-18 DIAGNOSIS — J4489 Other specified chronic obstructive pulmonary disease: Secondary | ICD-10-CM | POA: Diagnosis not present

## 2022-11-18 DIAGNOSIS — I110 Hypertensive heart disease with heart failure: Secondary | ICD-10-CM | POA: Diagnosis not present

## 2022-11-18 DIAGNOSIS — E1151 Type 2 diabetes mellitus with diabetic peripheral angiopathy without gangrene: Secondary | ICD-10-CM | POA: Diagnosis not present

## 2022-11-18 MED ORDER — BD PEN NEEDLE SHORT U/F 31G X 8 MM MISC: 3 refills | Status: DC

## 2022-11-22 DIAGNOSIS — E1151 Type 2 diabetes mellitus with diabetic peripheral angiopathy without gangrene: Secondary | ICD-10-CM | POA: Diagnosis not present

## 2022-11-22 DIAGNOSIS — I5033 Acute on chronic diastolic (congestive) heart failure: Secondary | ICD-10-CM | POA: Diagnosis not present

## 2022-11-22 DIAGNOSIS — E1169 Type 2 diabetes mellitus with other specified complication: Secondary | ICD-10-CM | POA: Diagnosis not present

## 2022-11-22 DIAGNOSIS — E7849 Other hyperlipidemia: Secondary | ICD-10-CM | POA: Diagnosis not present

## 2022-11-22 DIAGNOSIS — I110 Hypertensive heart disease with heart failure: Secondary | ICD-10-CM | POA: Diagnosis not present

## 2022-11-22 DIAGNOSIS — F419 Anxiety disorder, unspecified: Secondary | ICD-10-CM | POA: Diagnosis not present

## 2022-11-22 DIAGNOSIS — F32A Depression, unspecified: Secondary | ICD-10-CM | POA: Diagnosis not present

## 2022-11-22 DIAGNOSIS — J4489 Other specified chronic obstructive pulmonary disease: Secondary | ICD-10-CM | POA: Diagnosis not present

## 2022-11-22 DIAGNOSIS — E1142 Type 2 diabetes mellitus with diabetic polyneuropathy: Secondary | ICD-10-CM | POA: Diagnosis not present

## 2022-11-23 ENCOUNTER — Encounter: Payer: Self-pay | Admitting: Endocrinology

## 2022-11-23 DIAGNOSIS — E1142 Type 2 diabetes mellitus with diabetic polyneuropathy: Secondary | ICD-10-CM | POA: Diagnosis not present

## 2022-11-23 DIAGNOSIS — E1169 Type 2 diabetes mellitus with other specified complication: Secondary | ICD-10-CM | POA: Diagnosis not present

## 2022-11-23 DIAGNOSIS — J4489 Other specified chronic obstructive pulmonary disease: Secondary | ICD-10-CM | POA: Diagnosis not present

## 2022-11-23 DIAGNOSIS — F419 Anxiety disorder, unspecified: Secondary | ICD-10-CM | POA: Diagnosis not present

## 2022-11-23 DIAGNOSIS — F32A Depression, unspecified: Secondary | ICD-10-CM | POA: Diagnosis not present

## 2022-11-23 DIAGNOSIS — I110 Hypertensive heart disease with heart failure: Secondary | ICD-10-CM | POA: Diagnosis not present

## 2022-11-23 DIAGNOSIS — E1151 Type 2 diabetes mellitus with diabetic peripheral angiopathy without gangrene: Secondary | ICD-10-CM | POA: Diagnosis not present

## 2022-11-23 DIAGNOSIS — I5033 Acute on chronic diastolic (congestive) heart failure: Secondary | ICD-10-CM | POA: Diagnosis not present

## 2022-11-23 DIAGNOSIS — E7849 Other hyperlipidemia: Secondary | ICD-10-CM | POA: Diagnosis not present

## 2022-11-25 ENCOUNTER — Ambulatory Visit: Payer: Medicare HMO | Admitting: Podiatry

## 2022-11-28 DIAGNOSIS — E7849 Other hyperlipidemia: Secondary | ICD-10-CM | POA: Diagnosis not present

## 2022-11-28 DIAGNOSIS — F32A Depression, unspecified: Secondary | ICD-10-CM | POA: Diagnosis not present

## 2022-11-28 DIAGNOSIS — F419 Anxiety disorder, unspecified: Secondary | ICD-10-CM | POA: Diagnosis not present

## 2022-11-28 DIAGNOSIS — E1169 Type 2 diabetes mellitus with other specified complication: Secondary | ICD-10-CM | POA: Diagnosis not present

## 2022-11-28 DIAGNOSIS — I110 Hypertensive heart disease with heart failure: Secondary | ICD-10-CM | POA: Diagnosis not present

## 2022-11-28 DIAGNOSIS — J4489 Other specified chronic obstructive pulmonary disease: Secondary | ICD-10-CM | POA: Diagnosis not present

## 2022-11-28 DIAGNOSIS — I5033 Acute on chronic diastolic (congestive) heart failure: Secondary | ICD-10-CM | POA: Diagnosis not present

## 2022-11-28 DIAGNOSIS — E1151 Type 2 diabetes mellitus with diabetic peripheral angiopathy without gangrene: Secondary | ICD-10-CM | POA: Diagnosis not present

## 2022-11-28 DIAGNOSIS — E1142 Type 2 diabetes mellitus with diabetic polyneuropathy: Secondary | ICD-10-CM | POA: Diagnosis not present

## 2022-11-30 DIAGNOSIS — E1165 Type 2 diabetes mellitus with hyperglycemia: Secondary | ICD-10-CM | POA: Diagnosis not present

## 2022-11-30 DIAGNOSIS — G4700 Insomnia, unspecified: Secondary | ICD-10-CM | POA: Diagnosis not present

## 2022-11-30 DIAGNOSIS — E785 Hyperlipidemia, unspecified: Secondary | ICD-10-CM | POA: Diagnosis not present

## 2022-11-30 DIAGNOSIS — I10 Essential (primary) hypertension: Secondary | ICD-10-CM | POA: Diagnosis not present

## 2022-12-01 ENCOUNTER — Ambulatory Visit (INDEPENDENT_AMBULATORY_CARE_PROVIDER_SITE_OTHER): Payer: Medicare HMO | Admitting: Vascular Surgery

## 2022-12-01 ENCOUNTER — Ambulatory Visit (HOSPITAL_COMMUNITY)
Admission: RE | Admit: 2022-12-01 | Discharge: 2022-12-01 | Disposition: A | Discharge status: Home / Self Care | Payer: Medicare HMO | Source: Ambulatory Visit | Attending: Vascular Surgery | Admitting: Vascular Surgery

## 2022-12-01 ENCOUNTER — Other Ambulatory Visit (HOSPITAL_COMMUNITY): Payer: Self-pay

## 2022-12-01 ENCOUNTER — Encounter: Payer: Self-pay | Admitting: Vascular Surgery

## 2022-12-01 ENCOUNTER — Ambulatory Visit: Payer: Medicaid Other | Admitting: Family Medicine

## 2022-12-01 VITALS — BP 109/71 | HR 76 | Temp 98.0°F | Resp 20 | Ht 74.0 in | Wt 357.0 lb

## 2022-12-01 DIAGNOSIS — I872 Venous insufficiency (chronic) (peripheral): Secondary | ICD-10-CM

## 2022-12-01 DIAGNOSIS — R6889 Other general symptoms and signs: Secondary | ICD-10-CM | POA: Diagnosis not present

## 2022-12-01 DIAGNOSIS — I83812 Varicose veins of left lower extremities with pain: Secondary | ICD-10-CM | POA: Insufficient documentation

## 2022-12-01 MED ORDER — LOSARTAN POTASSIUM 25 MG PO TABS: 25.0000 mg | ORAL_TABLET | Freq: Every day | ORAL | 0 refills | Status: DC

## 2022-12-01 NOTE — Progress Notes (Signed)
Patient name: Lawrence [name] MRN: [id number] DOB: 27-Aug-1955 Sex: male  REASON FOR VISIT: Follow-up after laser ablation of the left great saphenous vein.  HPI: Lawrence [name] is a 67 y.o. male who had presented with combined chronic venous insufficiency and lymphedema.  He has CEAP C6 venous disease.  He had an ulcer on the lateral aspect of his left leg.  He had failed conservative treatment.  I recommended laser ablation of the left great saphenous vein to address his superficial venous disease.  I felt that he may potentially need staged ablation of the left anterior accessory saphenous vein.  Since I saw him last he states that the wound on the left leg has improved.  He had no significant swelling and had minimal bruising after the procedure.  Current Outpatient Medications  Medication Sig Dispense Refill   acetaminophen (TYLENOL) 650 MG CR tablet Take 1,300 mg by mouth every 8 (eight) hours as needed for pain.     albuterol (VENTOLIN HFA) 108 (90 Base) MCG/ACT inhaler Inhale 2 puffs into the lungs every 6 (six) hours as needed for wheezing or shortness of breath. 1 each 6   allopurinol (ZYLOPRIM) 100 MG tablet Take 200 mg by mouth 2 (two) times daily.     ARIPiprazole (ABILIFY) 5 MG tablet TAKE ONE TABLET BY MOUTH EVERYDAY AT BEDTIME (Patient taking differently: Take 5 mg by mouth at bedtime.) 90 tablet 3   atorvastatin (LIPITOR) 40 MG tablet Take 1 tablet (40 mg total) by mouth daily. (Patient taking differently: Take 40 mg by mouth at bedtime.) 90 tablet 3   busPIRone (BUSPAR) 10 MG tablet Take 1 tablet (10 mg total) by mouth 3 (three) times daily. 270 tablet 3   cetirizine (ZYRTEC) 10 MG tablet Take 10 mg by mouth at bedtime.      Cholecalciferol (VITAMIN D3 PO) Take 2,000 Units by mouth daily.     dapagliflozin propanediol (FARXIGA) 10 MG TABS tablet TAKE ONE TABLET BY MOUTH BEFORE BREAKFAST (Patient taking differently: Take 10 mg by mouth daily before breakfast.) 90 tablet 3    diclofenac Sodium (VOLTAREN) 1 % GEL Apply 4 g topically 4 (four) times daily. To right knee. 100 g 0   diltiazem (CARDIZEM CD) 120 MG 24 hr capsule TAKE ONE CAPSULE BY MOUTH ONCE DAILY 30 capsule 2   ELIQUIS 5 MG TABS tablet TAKE ONE TABLET BY MOUTH TWICE DAILY (Patient taking differently: Take 5 mg by mouth 2 (two) times daily.) 180 tablet 2   fenofibrate (TRICOR) 145 MG tablet Take 1 tablet (145 mg total) by mouth daily. 90 tablet 3   ferrous sulfate 324 (65 Fe) MG TBEC Take 324 mg by mouth daily with breakfast.      fluticasone (FLONASE) 50 MCG/ACT nasal spray Place 2 sprays into both nostrils daily.      Fluticasone-Umeclidin-Vilant (TRELEGY ELLIPTA) 200-62.5-25 MCG/ACT AEPB INHALE 1 PUFF BY MOUTH INTO LUNGS ONCE DAILY 60 each 11   insulin aspart (NOVOLOG FLEXPEN) 100 UNIT/ML FlexPen 10 units before meals, keep sugar 2 hours after meals under 180 at least (Patient taking differently: Inject 10 Units into the skin See admin instructions. Three times before meals, keep sugar 2 hours after meals under 180 at least) 15 mL 1   Insulin Pen Needle (B-D ULTRAFINE III SHORT PEN) 31G X 8 MM MISC Use as instructed 4X daily. 400 each 3   Lacosamide 150 MG TABS Take 1 tablet (150 mg total) by mouth 2 (two) times daily. 180  tablet 3   levETIRAcetam (KEPPRA) 500 MG tablet Take 1 tablet (500 mg total) by mouth 2 (two) times daily. 180 tablet 3   LORazepam (ATIVAN) 1 MG tablet Take 2 tablets 30 minutes prior to leaving house on day of office surgery.  Bring third tablet with you to office on day of office surgery. 3 tablet 0   LORazepam (ATIVAN) 1 MG tablet Take 2 tablets thirty minutes prior to leaving house on day of office surgery.  Bring third tablet with you to office on day of office surgery. 3 tablet 0   losartan (COZAAR) 25 MG tablet Take 1 tablet (25 mg total) by mouth daily. 30 tablet 0   metFORMIN (GLUCOPHAGE-XR) 500 MG 24 hr tablet Take 4 tablets (2,000 mg total) by mouth daily. (Patient taking  differently: Take 1,000 mg by mouth 2 (two) times daily with a meal.) 360 tablet 3   metoprolol succinate (TOPROL-XL) 25 MG 24 hr tablet Take 1 tablet (25 mg total) by mouth daily. 90 tablet 1   mirtazapine (REMERON) 15 MG tablet TAKE ONE TABLET BY MOUTH EVERYDAY AT BEDTIME (Patient taking differently: Take 15 mg by mouth at bedtime.) 30 tablet 2   omeprazole (PRILOSEC) 20 MG capsule Take 20 mg by mouth every morning.     potassium chloride SA (KLOR-CON M) 20 MEQ tablet TAKE TWO TABLETS BY MOUTH TWICE DAILY 270 tablet 3   prazosin (MINIPRESS) 2 MG capsule Take two tablets at bedtime. (Patient taking differently: Take 4 mg by mouth at bedtime.) 180 capsule 3   pregabalin (LYRICA) 150 MG capsule Take 1 capsule (150 mg total) by mouth 3 (three) times daily. 270 capsule 3   repaglinide (PRANDIN) 2 MG tablet TAKE TWO TABLETS BY MOUTH TWICE DAILY BEFORE A meal (Patient taking differently: Take 4 mg by mouth 2 (two) times daily before a meal.) 120 tablet 0   spironolactone (ALDACTONE) 25 MG tablet Take 0.5 tablets (12.5 mg total) by mouth daily. 45 tablet 3   tirzepatide (MOUNJARO) 15 MG/0.5ML Pen Inject 15 mg into the skin once a week. (Patient taking differently: Inject 15 mg into the skin every Monday.) 2 mL 1   traZODone (DESYREL) 100 MG tablet Take 200 mg by mouth at bedtime.     TRESIBA FLEXTOUCH 100 UNIT/ML FlexTouch Pen INJECT 40 UNITS into THE SKIN ONCE DAILY (Patient taking differently: Inject 40 Units into the skin daily.) 15 mL 3   VASCEPA 1 g capsule TAKE TWO CAPSULES BY MOUTH TWICE DAILY 120 capsule 5   venlafaxine XR (EFFEXOR-XR) 75 MG 24 hr capsule Take 3 capsules (225 mg total) by mouth daily with breakfast. 270 capsule 3   torsemide (DEMADEX) 20 MG tablet Take 4 tablets (80 mg total) by mouth 2 (two) times daily. 240 tablet 0   No current facility-administered medications for this visit.   REVIEW OF SYSTEMS: Arly.Keller ] denotes positive finding; [  ] denotes negative finding  CARDIOVASCULAR:    chest pain    dyspnea on exertion   leg swelling  CONSTITUTIONAL:   fever    chills  PHYSICAL EXAM: Vitals:   12/01/22 0850  BP: 109/71  Pulse: 76  Resp: 20  Temp: 98 F (36.7 C)  SpO2: 92%  Weight: (!) 357 lb (161.9 kg)  Height:  (1.88 m)   GENERAL: The patient is a well-nourished male, in no acute distress. The vital signs are documented above. CARDIOVASCULAR: There is a regular rate and rhythm.  PULMONARY: There is good air exchange bilaterally without wheezing or rales. VASCULAR: He has mild swelling bilaterally and hyperpigmentation bilaterally. He has minimal bruising in the left thigh.  DATA:  VENOUS DUPLEX: I have independently interpreted his venous duplex scan today.  This was of the left lower extremity only.  There was no evidence of DVT.  The left great saphenous vein was successfully closed from the distal thigh to within 9 mm of the saphenofemoral junction.  MEDICAL ISSUES:  S/P LASER ABLATION LEFT GREAT SAPHENOUS VEIN: The patient is doing well status post laser ablation of the left great saphenous vein.  The wound on the left leg has improved.  We have discussed the continued use of compression therapy and elevation.  I will see him back in 6 weeks.  If he is doing well and the wound is healed we may hold off on laser ablation of the anterior accessory saphenous vein.  He is in favor of that if possible.  If not I think we should consider laser ablation of the left anterior accessory saphenous vein which is significantly dilated.  The other consideration if he has recurrent ulcerations would be to try to get him a lymphedema pump.  Lawrence [name] [facility] [phone]

## 2022-12-02 DIAGNOSIS — F32A Depression, unspecified: Secondary | ICD-10-CM | POA: Diagnosis not present

## 2022-12-02 DIAGNOSIS — E1151 Type 2 diabetes mellitus with diabetic peripheral angiopathy without gangrene: Secondary | ICD-10-CM | POA: Diagnosis not present

## 2022-12-02 DIAGNOSIS — E1142 Type 2 diabetes mellitus with diabetic polyneuropathy: Secondary | ICD-10-CM | POA: Diagnosis not present

## 2022-12-02 DIAGNOSIS — I5033 Acute on chronic diastolic (congestive) heart failure: Secondary | ICD-10-CM | POA: Diagnosis not present

## 2022-12-02 DIAGNOSIS — I110 Hypertensive heart disease with heart failure: Secondary | ICD-10-CM | POA: Diagnosis not present

## 2022-12-02 DIAGNOSIS — E7849 Other hyperlipidemia: Secondary | ICD-10-CM | POA: Diagnosis not present

## 2022-12-02 DIAGNOSIS — J4489 Other specified chronic obstructive pulmonary disease: Secondary | ICD-10-CM | POA: Diagnosis not present

## 2022-12-02 DIAGNOSIS — F419 Anxiety disorder, unspecified: Secondary | ICD-10-CM | POA: Diagnosis not present

## 2022-12-02 DIAGNOSIS — E1169 Type 2 diabetes mellitus with other specified complication: Secondary | ICD-10-CM | POA: Diagnosis not present

## 2022-12-04 DIAGNOSIS — E1142 Type 2 diabetes mellitus with diabetic polyneuropathy: Secondary | ICD-10-CM | POA: Diagnosis not present

## 2022-12-04 DIAGNOSIS — E7849 Other hyperlipidemia: Secondary | ICD-10-CM | POA: Diagnosis not present

## 2022-12-04 DIAGNOSIS — E1169 Type 2 diabetes mellitus with other specified complication: Secondary | ICD-10-CM | POA: Diagnosis not present

## 2022-12-04 DIAGNOSIS — F32A Depression, unspecified: Secondary | ICD-10-CM | POA: Diagnosis not present

## 2022-12-04 DIAGNOSIS — J4489 Other specified chronic obstructive pulmonary disease: Secondary | ICD-10-CM | POA: Diagnosis not present

## 2022-12-04 DIAGNOSIS — E1151 Type 2 diabetes mellitus with diabetic peripheral angiopathy without gangrene: Secondary | ICD-10-CM | POA: Diagnosis not present

## 2022-12-04 DIAGNOSIS — I5033 Acute on chronic diastolic (congestive) heart failure: Secondary | ICD-10-CM | POA: Diagnosis not present

## 2022-12-04 DIAGNOSIS — I110 Hypertensive heart disease with heart failure: Secondary | ICD-10-CM | POA: Diagnosis not present

## 2022-12-04 DIAGNOSIS — F419 Anxiety disorder, unspecified: Secondary | ICD-10-CM | POA: Diagnosis not present

## 2022-12-05 ENCOUNTER — Ambulatory Visit (HOSPITAL_COMMUNITY)
Admission: RE | Admit: 2022-12-05 | Discharge: 2022-12-05 | Disposition: A | Discharge status: Home / Self Care | Payer: Medicare HMO | Source: Ambulatory Visit | Attending: Cardiology | Admitting: Cardiology

## 2022-12-05 ENCOUNTER — Encounter (HOSPITAL_COMMUNITY): Payer: Self-pay | Admitting: Cardiology

## 2022-12-05 VITALS — BP 100/60 | HR 85 | Wt 360.0 lb

## 2022-12-05 DIAGNOSIS — R569 Unspecified convulsions: Secondary | ICD-10-CM | POA: Insufficient documentation

## 2022-12-05 DIAGNOSIS — R6889 Other general symptoms and signs: Secondary | ICD-10-CM | POA: Diagnosis not present

## 2022-12-05 DIAGNOSIS — Z6841 Body Mass Index (BMI) 40.0 and over, adult: Secondary | ICD-10-CM | POA: Diagnosis not present

## 2022-12-05 DIAGNOSIS — J4489 Other specified chronic obstructive pulmonary disease: Secondary | ICD-10-CM | POA: Diagnosis not present

## 2022-12-05 DIAGNOSIS — Z79899 Other long term (current) drug therapy: Secondary | ICD-10-CM | POA: Insufficient documentation

## 2022-12-05 DIAGNOSIS — I5033 Acute on chronic diastolic (congestive) heart failure: Secondary | ICD-10-CM | POA: Diagnosis not present

## 2022-12-05 DIAGNOSIS — Z87891 Personal history of nicotine dependence: Secondary | ICD-10-CM | POA: Diagnosis not present

## 2022-12-05 DIAGNOSIS — I350 Nonrheumatic aortic (valve) stenosis: Secondary | ICD-10-CM | POA: Diagnosis not present

## 2022-12-05 DIAGNOSIS — E785 Hyperlipidemia, unspecified: Secondary | ICD-10-CM | POA: Insufficient documentation

## 2022-12-05 DIAGNOSIS — Z5986 Financial insecurity: Secondary | ICD-10-CM | POA: Diagnosis not present

## 2022-12-05 DIAGNOSIS — E1142 Type 2 diabetes mellitus with diabetic polyneuropathy: Secondary | ICD-10-CM | POA: Diagnosis not present

## 2022-12-05 DIAGNOSIS — F32A Depression, unspecified: Secondary | ICD-10-CM | POA: Diagnosis not present

## 2022-12-05 DIAGNOSIS — Z8249 Family history of ischemic heart disease and other diseases of the circulatory system: Secondary | ICD-10-CM | POA: Insufficient documentation

## 2022-12-05 DIAGNOSIS — Z7984 Long term (current) use of oral hypoglycemic drugs: Secondary | ICD-10-CM | POA: Insufficient documentation

## 2022-12-05 DIAGNOSIS — I480 Paroxysmal atrial fibrillation: Secondary | ICD-10-CM | POA: Diagnosis not present

## 2022-12-05 DIAGNOSIS — E119 Type 2 diabetes mellitus without complications: Secondary | ICD-10-CM | POA: Diagnosis not present

## 2022-12-05 DIAGNOSIS — Z794 Long term (current) use of insulin: Secondary | ICD-10-CM | POA: Diagnosis not present

## 2022-12-05 DIAGNOSIS — E1169 Type 2 diabetes mellitus with other specified complication: Secondary | ICD-10-CM | POA: Diagnosis not present

## 2022-12-05 DIAGNOSIS — Z7901 Long term (current) use of anticoagulants: Secondary | ICD-10-CM | POA: Insufficient documentation

## 2022-12-05 DIAGNOSIS — F419 Anxiety disorder, unspecified: Secondary | ICD-10-CM | POA: Diagnosis not present

## 2022-12-05 DIAGNOSIS — E1151 Type 2 diabetes mellitus with diabetic peripheral angiopathy without gangrene: Secondary | ICD-10-CM | POA: Diagnosis not present

## 2022-12-05 DIAGNOSIS — I5032 Chronic diastolic (congestive) heart failure: Secondary | ICD-10-CM | POA: Diagnosis not present

## 2022-12-05 DIAGNOSIS — E7849 Other hyperlipidemia: Secondary | ICD-10-CM | POA: Diagnosis not present

## 2022-12-05 DIAGNOSIS — I110 Hypertensive heart disease with heart failure: Secondary | ICD-10-CM | POA: Insufficient documentation

## 2022-12-05 DIAGNOSIS — Z833 Family history of diabetes mellitus: Secondary | ICD-10-CM | POA: Insufficient documentation

## 2022-12-05 LAB — BASIC METABOLIC PANEL
Anion gap: 11 (ref 5–15)
BUN: 26 mg/dL — ABNORMAL HIGH (ref 8–23)
CO2: 31 mmol/L (ref 22–32)
Calcium: 9.2 mg/dL (ref 8.9–10.3)
Chloride: 99 mmol/L (ref 98–111)
Creatinine, Ser: 1 mg/dL (ref 0.61–1.24)
GFR, Estimated: >60 mL/min (ref >60)
Glucose, Bld: 165 mg/dL — ABNORMAL HIGH (ref 70–99)
Potassium: 3.9 mmol/L (ref 3.5–5.1)
Sodium: 141 mmol/L (ref 135–145)

## 2022-12-05 LAB — HEPATIC FUNCTION PANEL
ALT: 20 U/L (ref 0–44)
AST: 19 U/L (ref 15–41)
Albumin: 3.6 g/dL (ref 3.5–5.0)
Alkaline Phosphatase: 78 U/L (ref 38–126)
Bilirubin, Direct: 0.1 mg/dL (ref 0.0–0.2)
Indirect Bilirubin: 0.6 mg/dL (ref 0.3–0.9)
Total Bilirubin: 0.7 mg/dL (ref 0.3–1.2)
Total Protein: 7.1 g/dL (ref 6.5–8.1)

## 2022-12-05 LAB — BRAIN NATRIURETIC PEPTIDE: B Natriuretic Peptide: 19.3 pg/mL (ref 0.0–100.0)

## 2022-12-05 LAB — LIPID PANEL
Cholesterol: 139 mg/dL (ref 0–200)
HDL: 31 mg/dL — ABNORMAL LOW (ref >40)
LDL Cholesterol: 60 mg/dL (ref 0–99)
Total CHOL/HDL Ratio: 4.5 RATIO
Triglycerides: 239 mg/dL — ABNORMAL HIGH (ref <150)
VLDL: 48 mg/dL — ABNORMAL HIGH (ref 0–40)

## 2022-12-05 MED ORDER — POTASSIUM CHLORIDE CRYS ER 20 MEQ PO TBCR: 40.0000 meq | EXTENDED_RELEASE_TABLET | Freq: Two times a day (BID) | ORAL | 3 refills | Status: DC

## 2022-12-05 MED ORDER — METOLAZONE 2.5 MG PO TABS: 2.5000 mg | ORAL_TABLET | ORAL | 3 refills | Status: DC

## 2022-12-05 NOTE — Patient Instructions (Signed)
START Metolazone 2.5 mg every Tuesday   TAKE 60 mEq ( 3 Tabs) Twice a day every Tuesday  Labs done today, your results will be available in MyChart, we will contact you for abnormal readings.  Repeat blood work in 2 weeks  Your physician has requested that you have an echocardiogram. Echocardiography is a painless test that uses sound waves to create images of your heart. It provides your doctor with information about the size and shape of your heart and how well your heart's chambers and valves are working. This procedure takes approximately one hour. There are no restrictions for this procedure. Please do NOT wear cologne, perfume, aftershave, or lotions (deodorant is allowed). Please arrive 15 minutes prior to your appointment time.  Your physician recommends that you schedule a follow-up appointment in: 6 weeks  If you have any questions or concerns before your next appointment please send [place] a message through [place] or call our office at [phone].    TO LEAVE A MESSAGE FOR THE NURSE SELECT OPTION 2, PLEASE LEAVE A MESSAGE INCLUDING: YOUR NAME DATE OF BIRTH CALL BACK NUMBER REASON FOR CALL**this is important as we prioritize the call backs  YOU WILL RECEIVE A CALL BACK THE SAME DAY AS LONG AS YOU CALL BEFORE 4:00 PM  At the [facility], you and your health needs are our priority. As part of our continuing mission to provide you with exceptional heart care, we have created designated Provider Care Teams. These Care Teams include your primary Cardiologist (physician) and Advanced Practice Providers (APPs- Physician Assistants and Nurse Practitioners) who all work together to provide you with the care you need, when you need it.   You may see any of the following providers on your designated Care Team at your next follow up: Dr Arvilla Meres Dr Marca Ancona Dr. Marcos Eke, NP Robbie Lis, [place] [facility] [place], [place] Brynda Peon, NP Karle Plumber, PharmD   Please be sure to bring in all your medications bottles to every appointment.    Thank you for choosing [facility]

## 2022-12-06 ENCOUNTER — Other Ambulatory Visit: Payer: Self-pay

## 2022-12-06 ENCOUNTER — Telehealth: Payer: Self-pay | Admitting: Cardiology

## 2022-12-06 DIAGNOSIS — E1165 Type 2 diabetes mellitus with hyperglycemia: Secondary | ICD-10-CM

## 2022-12-06 MED ORDER — BD PEN NEEDLE SHORT U/F 31G X 8 MM MISC: 1.0000 | Freq: Three times a day (TID) | 5 refills | Status: DC

## 2022-12-06 NOTE — Progress Notes (Signed)
PCP: Lawrence Soho, PA-C Cardiology: Dr. Mayford [name] HF Cardiology: Dr. Shirlee [name]  67 y.o. with history of paroxysmal atrial fibrillation, OSA/?OHS, morbid obesity, and chronic diastolic CHF was referred by Dr. Mayford [name] for evaluation of CHF.  Patient has been admitted multiple times in the last year for CHF exacerbations.  He uses a walker around the house and a motorized scooter outside the house.   He had a negative cath in 2019 and a negative Cardiolite in 4/22.  Most recent echo in 11/22 was a difficult study but showed EF 60-65%, mild LVH, mildly dilated LV, moderate aortic stenosis (trileaflet aortic valve) with mean gradient 22 mmHg, DI 0.27.   He was admitted in 12/22 with fever/PNA as well as chest pain (ruled out for MI).  He had atrial fibrillation/RVR this admission but converted back to NSR.  He was diuresed. Additionally, he was thought to have a partial seizure and Keppra was restarted.   Cardiomems was placed. 08/2021  Admitted 12/15/21 with left facial droop. CT of head negative. Neurology felt this was secondary to breakthrough seizures. Continued on Keppra.   Patient has been seen by [facility] for venous insufficiency, has had laser ablation of the great saphenous vein.   Echo in 10/23 showed EF 60-65%, mild LV dilation, normal RV, moderate AS with mean gradient 28 and AVA around 1 cm^2.    Admitted in 3/24 with CHF and diuresed.   Today he returns for HF follow up.  Uses motorized scooter outside of the home. Remains quite limited by back pain. We have not seen him in a while, weight up 19 lbs despite use of tirzepatide though he says weight has been stable recently at home. He is working with PT at home, very deconditioned.  Short of breath walking 10 feet with his walker.  Chronic orthopnea, no change.  No chest pain.    ECG (personally reviewed): NSR, 1st degree AVB, poor RWP, inferior Qs  Cardiomems Reading  = 15 Goal =14   Labs (11/22): K 3.7, creatinine 0.89 Labs (1/23): K  3.7, creatinine 0.93 => 0.84, BNP 18 => 37 Labs (03/09/22): K 3.6 Creatinine 1 Labs (3/24): K 3.8, creatinine 0.89, BNP 22  PMH: 1. Type 2 diabetes 2. Atrial fibrillation: Paroxysmal.  3. HTN 4. Hyperlipidemia 5. OSA, ?OHS: Uses Bipap.  6. Obesity 7. SVT 8. Chest pain: LHC in 6/19 with minimal CAD.  - Cardiolite (4/22): No ischemia.  9. Depression/anxiety.  10. Chronic diastolic CHF: Echo in 11/22 with EF 60-65%, mild LVH, mildly dilated LV, moderate aortic stenosis (trileaflet aortic valve) with mean gradient 22 mmHg, DI 0.27.  - RHC (1/23): RA mean 16, PA 49/24, mean PCWP 16, CI 3.13, PVR 2 WU.  - Cardomems in place.  - Echo (10/23): EF 60-65%, mild LV dilation, normal RV, moderate AS with mean gradient 28 and AVA around 1 cm^2.  11. Partial seizures 12. Aortic stenosis: TEE (1/23) with EF 60-65%, mild LVH, mild RVE with normal RV systolic function, mid AI, moderate AS with AVA 1.35 cm^2 and mean gradient 28 mmHg.  - Echo (10/23) with moderate AS with mean gradient 28 and AVA around 1 cm^2. 13. Venous insufficiency: Laser ablation of great saphenous vein.   Social History   Socioeconomic History   Marital status: Significant Other    Spouse name: Lawrence [name]   Number of children: 1   Years of education: Not on file   Highest education level: Associate degree: academic program  Occupational History   Occupation:  retired    Comment: Building control surveyor  Tobacco Use   Smoking status: Former    Packs/day: 1.00    Years: 44.00    Additional pack years: 0.00    Total pack years: 44.00    Types: Cigarettes    Quit date: 03/07/2016    Years since quitting: 6.7    Passive exposure: Past   Smokeless tobacco: Never  Vaping Use   Vaping Use: Never used  Substance and Sexual Activity   Alcohol use: Yes    Comment: rare   Drug use: No   Sexual activity: Not on file  Other Topics Concern   Not on file  Social History Narrative   Lives with wife Lawrence [name]   Right Handed   Drinks  1-2 cups caffeine   Social Determinants of Health   Financial Resource Strain: Medium Risk (06/08/2021)   Overall Financial Resource Strain (CARDIA)    Difficulty of Paying Living Expenses: Somewhat hard  Food Insecurity: No Food Insecurity (10/26/2022)   Hunger Vital Sign    Worried About Running Out of Food in the Last Year: Never true    Ran Out of Food in the Last Year: Never true  Transportation Needs: No Transportation Needs (10/26/2022)   PRAPARE - Administrator, Civil Service (Medical): No    Lack of Transportation (Non-Medical): No  Physical Activity: Not on file  Stress: Not on file  Social Connections: Not on file  Intimate Partner Violence: Unknown (10/26/2022)   Humiliation, Afraid, Rape, and Kick questionnaire    Fear of Current or Ex-Partner: No    Emotionally Abused: No    Physically Abused: Not on file    Sexually Abused: No   Family History  Problem Relation Age of Onset   CAD Maternal Grandfather    Diabetes Other    Diabetes Mellitus II Neg Hx    Colon cancer Neg Hx    Esophageal cancer Neg Hx    Inflammatory bowel disease Neg Hx    Liver disease Neg Hx    Pancreatic cancer Neg Hx    Rectal cancer Neg Hx    Stomach cancer Neg Hx    Sleep apnea Neg Hx    ROS: All systems reviewed and negative except as per HPI.   Current Outpatient Medications  Medication Sig Dispense Refill   acetaminophen (TYLENOL) 650 MG CR tablet Take 1,300 mg by mouth every 8 (eight) hours as needed for pain.     albuterol (VENTOLIN HFA) 108 (90 Base) MCG/ACT inhaler Inhale 2 puffs into the lungs every 6 (six) hours as needed for wheezing or shortness of breath. 1 each 6   allopurinol (ZYLOPRIM) 100 MG tablet Take 200 mg by mouth 2 (two) times daily.     ARIPiprazole (ABILIFY) 5 MG tablet TAKE ONE TABLET BY MOUTH EVERYDAY AT BEDTIME 90 tablet 3   atorvastatin (LIPITOR) 40 MG tablet Take 1 tablet (40 mg total) by mouth daily. 90 tablet 3   busPIRone (BUSPAR) 10 MG tablet  Take 1 tablet (10 mg total) by mouth 3 (three) times daily. 270 tablet 3   cetirizine (ZYRTEC) 10 MG tablet Take 10 mg by mouth at bedtime.      Cholecalciferol (VITAMIN D3 PO) Take 2,000 Units by mouth daily.     dapagliflozin propanediol (FARXIGA) 10 MG TABS tablet TAKE ONE TABLET BY MOUTH BEFORE BREAKFAST 90 tablet 3   diclofenac Sodium (VOLTAREN) 1 % GEL Apply 4 g topically 4 (four) times daily. To  right knee. 100 g 0   diltiazem (CARDIZEM CD) 120 MG 24 hr capsule TAKE ONE CAPSULE BY MOUTH ONCE DAILY 30 capsule 2   ELIQUIS 5 MG TABS tablet TAKE ONE TABLET BY MOUTH TWICE DAILY 180 tablet 2   fenofibrate (TRICOR) 145 MG tablet Take 1 tablet (145 mg total) by mouth daily. 90 tablet 3   ferrous sulfate 324 (65 Fe) MG TBEC Take 324 mg by mouth daily with breakfast.      fluticasone (FLONASE) 50 MCG/ACT nasal spray Place 2 sprays into both nostrils daily.      Fluticasone-Umeclidin-Vilant (TRELEGY ELLIPTA) 200-62.5-25 MCG/ACT AEPB INHALE 1 PUFF BY MOUTH INTO LUNGS ONCE DAILY 60 each 11   insulin aspart (NOVOLOG FLEXPEN) 100 UNIT/ML FlexPen 10 units before meals, keep sugar 2 hours after meals under 180 at least 15 mL 1   Insulin Pen Needle (B-D ULTRAFINE III SHORT PEN) 31G X 8 MM MISC Use as instructed 4X daily. 400 each 3   Lacosamide 150 MG TABS Take 1 tablet (150 mg total) by mouth 2 (two) times daily. 180 tablet 3   levETIRAcetam (KEPPRA) 500 MG tablet Take 1 tablet (500 mg total) by mouth 2 (two) times daily. 180 tablet 3   losartan (COZAAR) 25 MG tablet Take 1 tablet (25 mg total) by mouth daily. 30 tablet 0   metFORMIN (GLUCOPHAGE-XR) 500 MG 24 hr tablet Take 4 tablets (2,000 mg total) by mouth daily. 360 tablet 3   metolazone (ZAROXOLYN) 2.5 MG tablet Take 1 tablet (2.5 mg total) by mouth once a week. Every Tuesday 20 tablet 3   metoprolol succinate (TOPROL-XL) 25 MG 24 hr tablet Take 1 tablet (25 mg total) by mouth daily. 90 tablet 1   mirtazapine (REMERON) 15 MG tablet TAKE ONE TABLET BY  MOUTH EVERYDAY AT BEDTIME 30 tablet 2   omeprazole (PRILOSEC) 20 MG capsule Take 20 mg by mouth every morning.     prazosin (MINIPRESS) 2 MG capsule Take two tablets at bedtime. 180 capsule 3   pregabalin (LYRICA) 150 MG capsule Take 1 capsule (150 mg total) by mouth 3 (three) times daily. 270 capsule 3   repaglinide (PRANDIN) 2 MG tablet TAKE TWO TABLETS BY MOUTH TWICE DAILY BEFORE A meal 120 tablet 0   spironolactone (ALDACTONE) 25 MG tablet Take 0.5 tablets (12.5 mg total) by mouth daily. 45 tablet 3   Tirzepatide (MOUNJARO [place]) Inject 10 mg into the skin once a week.     torsemide (DEMADEX) 20 MG tablet Take 4 tablets (80 mg total) by mouth 2 (two) times daily. 240 tablet 0   traZODone (DESYREL) 100 MG tablet Take 200 mg by mouth at bedtime.     TRESIBA FLEXTOUCH 100 UNIT/ML FlexTouch Pen INJECT 40 UNITS into THE SKIN ONCE DAILY 15 mL 3   VASCEPA 1 g capsule TAKE TWO CAPSULES BY MOUTH TWICE DAILY 120 capsule 5   venlafaxine XR (EFFEXOR-XR) 75 MG 24 hr capsule Take 3 capsules (225 mg total) by mouth daily with breakfast. 270 capsule 3   potassium chloride SA (KLOR-CON M) 20 MEQ tablet Take 2 tablets (40 mEq total) by mouth 2 (two) times daily. Except Tuesdays when you take ( 3 Tabs) 270 tablet 3   No current facility-administered medications for this encounter.   BP 100/60   Pulse 85   Wt (!) 163.3 kg (360 lb)   SpO2 90%   BMI 46.22 kg/m  Wt Readings from Last 3 Encounters:  12/05/22 (!) 163.3 kg (  360 lb)  12/01/22 (!) 161.9 kg (357 lb)  11/16/22 (!) 161.9 kg (357 lb)   General: NAD, obese.  In motorized scooter.  Neck: Thick. JVP difficult, no thyromegaly or thyroid nodule.  Lungs: Clear to auscultation bilaterally with normal respiratory effort. CV: Nondisplaced PMI.  Heart regular S1/S2, no S3/S4, 3/6 SEM RUSB with clear S2.  Trace edema with extensive venous varicosities.  No carotid bruit.  Normal pedal pulses.  Abdomen: Soft, nontender, no hepatosplenomegaly, no  distention.  Skin: Intact without lesions or rashes.  Neurologic: Alert and oriented x 3.  Psych: Normal affect. Extremities: No clubbing or cyanosis.  HEENT: Normal.   Assessment/Plan: 1. Chronic diastolic CHF: Echo (11/22) with EF 60-65%, mild LVH, mildly dilated LV, moderate aortic stenosis (trileaflet aortic valve) with mean gradient 22 mmHg, DI 0.27.  Echo in 10/23 with EF 60-65%, mild LV dilation, normal RV, moderate AS with mean gradient 28 and AVA around 1 cm^2. Symptoms are confounded by morbid obesity, back pain, and deconditioning.  NYHA class IIIb.  Cardiomems is mildly above goal and weight is up.  - Continue torsemide 80 mg bid and will add metolazone 2.5 mg every Tuesday morning.  He will take KCl 60 bid on metolazone days and 40 bid on other days. BMET/BNP today, BMET 2 wks.  - Continue dapagliflozin 10 daily.  - Continue spironolactone 12.5 mg daily. 2. Aortic stenosis: Moderate AS on TEE in 1/23 and echo in 10/23.  - Repeat echo to follow AS.  3. Atrial fibrillation: Paroxysmal. NSR today.  - Continue apixaban.   - Continue Toprol XL and diltiazem CD.  4. OSA/?OHS: Using Bipap nightly. Continue Bipap.  5. Obesity: Body mass index is 46.22 kg/m. - Continue tirzepatide, he has not lost weight yet with this.  6. Hyperlipidemia: On statin.  - Check lipids today.  7. Seizures  - On Keppra per Neurology   Follwup 6 wks with APP.   Marca Ancona  12/06/2022

## 2022-12-06 NOTE — Telephone Encounter (Signed)
Patient stated he had blood work done at his doctor's office yesterday and will have test results forwarded to Dr.Turner. Patient stated he will do fasting test on May 9th and will forward those results as well.  Patient reported his EKG was fine and he is scheduled for an echo test on 5/16.

## 2022-12-07 ENCOUNTER — Other Ambulatory Visit (HOSPITAL_COMMUNITY): Payer: Self-pay | Admitting: Cardiology

## 2022-12-07 DIAGNOSIS — R6889 Other general symptoms and signs: Secondary | ICD-10-CM | POA: Diagnosis not present

## 2022-12-08 ENCOUNTER — Telehealth: Payer: Self-pay | Admitting: *Deleted

## 2022-12-08 ENCOUNTER — Ambulatory Visit: Payer: Medicare HMO | Attending: Physician Assistant

## 2022-12-09 ENCOUNTER — Ambulatory Visit (INDEPENDENT_AMBULATORY_CARE_PROVIDER_SITE_OTHER): Payer: Medicare HMO | Admitting: Podiatry

## 2022-12-09 ENCOUNTER — Encounter: Payer: Self-pay | Admitting: Podiatry

## 2022-12-09 DIAGNOSIS — F32A Depression, unspecified: Secondary | ICD-10-CM | POA: Diagnosis not present

## 2022-12-09 DIAGNOSIS — E1142 Type 2 diabetes mellitus with diabetic polyneuropathy: Secondary | ICD-10-CM

## 2022-12-09 DIAGNOSIS — E1151 Type 2 diabetes mellitus with diabetic peripheral angiopathy without gangrene: Secondary | ICD-10-CM | POA: Diagnosis not present

## 2022-12-09 DIAGNOSIS — E7849 Other hyperlipidemia: Secondary | ICD-10-CM | POA: Diagnosis not present

## 2022-12-09 DIAGNOSIS — M79674 Pain in right toe(s): Secondary | ICD-10-CM

## 2022-12-09 DIAGNOSIS — I5033 Acute on chronic diastolic (congestive) heart failure: Secondary | ICD-10-CM | POA: Diagnosis not present

## 2022-12-09 DIAGNOSIS — R6889 Other general symptoms and signs: Secondary | ICD-10-CM | POA: Diagnosis not present

## 2022-12-09 DIAGNOSIS — M79675 Pain in left toe(s): Secondary | ICD-10-CM | POA: Diagnosis not present

## 2022-12-09 DIAGNOSIS — W450XXA Nail entering through skin, initial encounter: Secondary | ICD-10-CM

## 2022-12-09 DIAGNOSIS — B351 Tinea unguium: Secondary | ICD-10-CM

## 2022-12-09 DIAGNOSIS — E1169 Type 2 diabetes mellitus with other specified complication: Secondary | ICD-10-CM | POA: Diagnosis not present

## 2022-12-09 DIAGNOSIS — F419 Anxiety disorder, unspecified: Secondary | ICD-10-CM | POA: Diagnosis not present

## 2022-12-09 DIAGNOSIS — I110 Hypertensive heart disease with heart failure: Secondary | ICD-10-CM | POA: Diagnosis not present

## 2022-12-09 DIAGNOSIS — J4489 Other specified chronic obstructive pulmonary disease: Secondary | ICD-10-CM | POA: Diagnosis not present

## 2022-12-09 NOTE — Progress Notes (Signed)
This patient returns to my office for at risk foot care.  This patient requires this care by a professional since this patient will be at risk due to having diabetic neuropathy and angiopathy and venous stasis.  . This patient is unable to cut nails himself since the patient cannot reach his nails.These nails are painful walking and wearing shoes. He also says he injured his third toenail right foot.  He says it bled but has not been painful.This patient presents for at risk foot care today.  General Appearance  Alert, conversant and in no acute stress.  Vascular  Dorsalis pedis and posterior tibial  pulses are not  palpable  billaterally due to severe swelling feet.  Capillary return is within normal limits  bilaterally. Temperature is within normal limits  Bilaterally. Venous stasis right leg.  Neurologic  Senn-Weinstein monofilament wire test absent right foot.  Diminished left foot.. Muscle power within normal limits bilaterally.  Nails Thick disfigured discolored nails with subungual debris  from hallux to fifth toes bilaterally.  Pincer hallux nails   B/L. Third toenail right foot is unattached to nail bed.  Orthopedic  No limitations of motion  feet .  No crepitus or effusions noted.  No bony pathology or digital deformities noted.  Severe HAV  B/L.  Skin  normotropic skin with no porokeratosis noted bilaterally.  No signs of infections or ulcers noted.     Onychomycosis  Pain in right toes  Pain in left toes  Third toenail right foot injury.  Consent was obtained for treatment procedures.   Mechanical debridement of nails 1-5  bilaterally performed with a nail nipper.  Filed with dremel without incident.  Nail removed third toenail right foot. DSD applied.  Told him to peroxide third toe right.   Return office visit   3 months                   Told patient to return for periodic foot care and evaluation due to potential at risk complications.   Helane Gunther DPM

## 2022-12-10 IMAGING — DX DG CHEST 1V PORT
2 series · 2 of 2 positions shown · non-contrast
Comparison: Radiograph 10/16/2020

CLINICAL DATA: Chest pain, shortness of breath and dizziness for 3
hours.

EXAM:
PORTABLE CHEST 1 VIEW

[chest ap (1 of 2)]
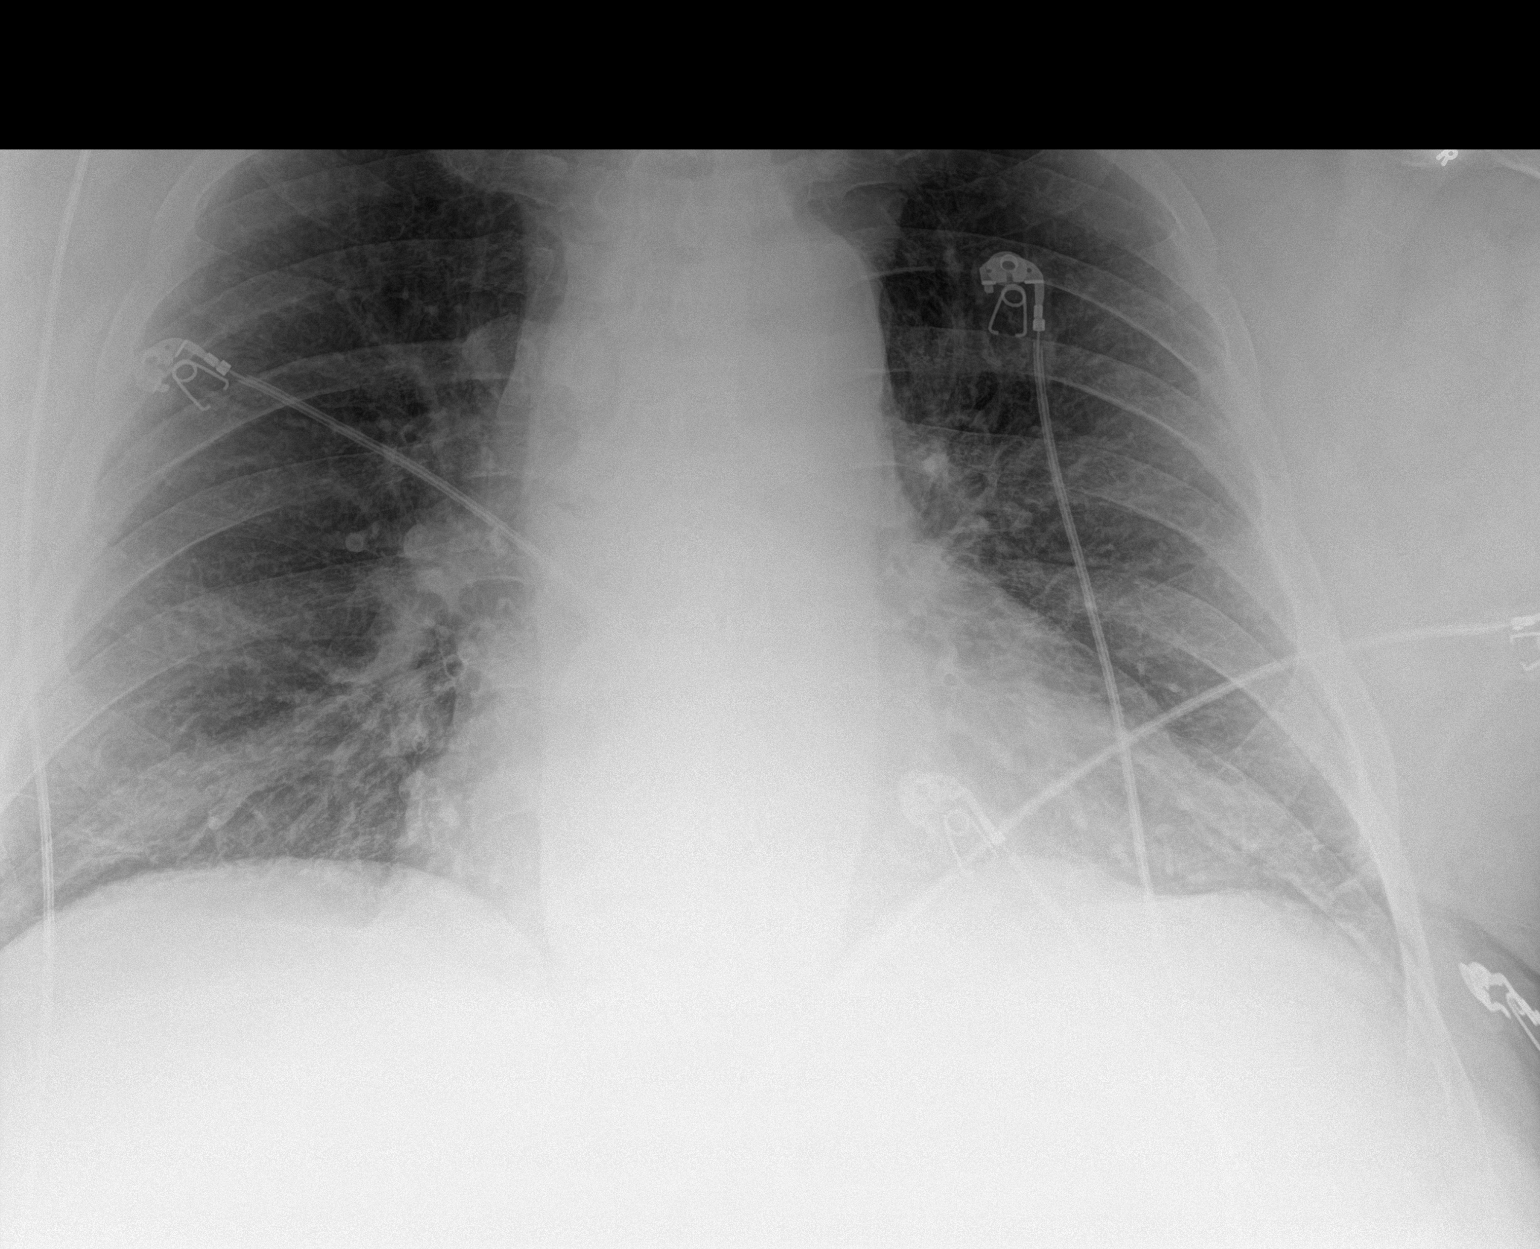

[chest ap (2 of 2)]
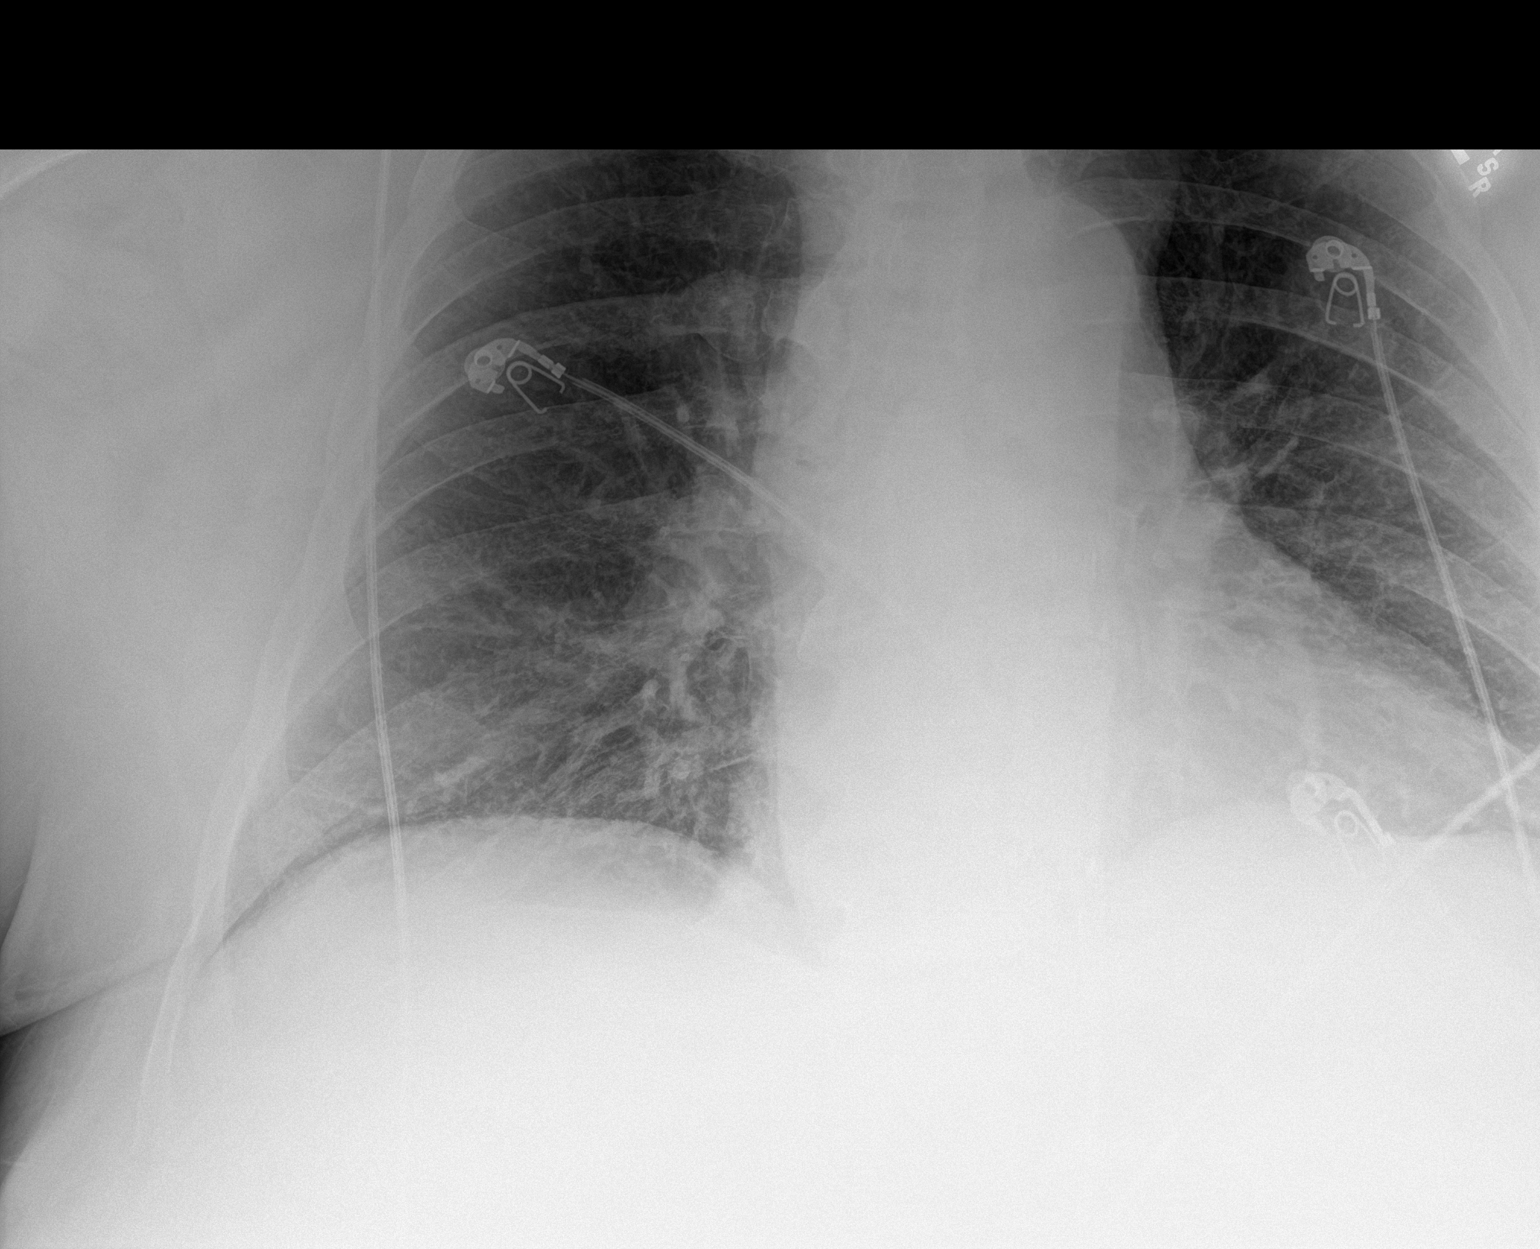

[2 of 2 positions shown; findings below may reference images not displayed]

FINDINGS: Stable cardiomegaly. Unchanged mediastinal contours. Subsegmental
atelectasis or scarring in the lung bases. No acute or confluent
airspace disease. Unchanged peribronchial thickening. No large
pleural effusion or pneumothorax. No acute osseous abnormalities are
seen.
IMPRESSION: 1. Stable cardiomegaly. Subsegmental bibasilar atelectasis or
scarring.
2. Unchanged peribronchial thickening

## 2022-12-13 DIAGNOSIS — I5033 Acute on chronic diastolic (congestive) heart failure: Secondary | ICD-10-CM | POA: Diagnosis not present

## 2022-12-13 DIAGNOSIS — F419 Anxiety disorder, unspecified: Secondary | ICD-10-CM | POA: Diagnosis not present

## 2022-12-13 DIAGNOSIS — E7849 Other hyperlipidemia: Secondary | ICD-10-CM | POA: Diagnosis not present

## 2022-12-13 DIAGNOSIS — E1142 Type 2 diabetes mellitus with diabetic polyneuropathy: Secondary | ICD-10-CM | POA: Diagnosis not present

## 2022-12-13 DIAGNOSIS — F32A Depression, unspecified: Secondary | ICD-10-CM | POA: Diagnosis not present

## 2022-12-13 DIAGNOSIS — E1151 Type 2 diabetes mellitus with diabetic peripheral angiopathy without gangrene: Secondary | ICD-10-CM | POA: Diagnosis not present

## 2022-12-13 DIAGNOSIS — E1169 Type 2 diabetes mellitus with other specified complication: Secondary | ICD-10-CM | POA: Diagnosis not present

## 2022-12-13 DIAGNOSIS — J4489 Other specified chronic obstructive pulmonary disease: Secondary | ICD-10-CM | POA: Diagnosis not present

## 2022-12-13 DIAGNOSIS — I110 Hypertensive heart disease with heart failure: Secondary | ICD-10-CM | POA: Diagnosis not present

## 2022-12-15 ENCOUNTER — Ambulatory Visit (HOSPITAL_COMMUNITY)
Admission: RE | Admit: 2022-12-15 | Discharge: 2022-12-15 | Disposition: A | Discharge status: Home / Self Care | Payer: Medicare HMO | Source: Ambulatory Visit | Attending: Internal Medicine | Admitting: Internal Medicine

## 2022-12-15 DIAGNOSIS — I5032 Chronic diastolic (congestive) heart failure: Secondary | ICD-10-CM | POA: Insufficient documentation

## 2022-12-15 DIAGNOSIS — R6889 Other general symptoms and signs: Secondary | ICD-10-CM | POA: Diagnosis not present

## 2022-12-15 LAB — BASIC METABOLIC PANEL
Anion gap: 7 (ref 5–15)
BUN: 26 mg/dL — ABNORMAL HIGH (ref 8–23)
CO2: 32 mmol/L (ref 22–32)
Calcium: 8.8 mg/dL — ABNORMAL LOW (ref 8.9–10.3)
Chloride: 99 mmol/L (ref 98–111)
Creatinine, Ser: 0.86 mg/dL (ref 0.61–1.24)
GFR, Estimated: >60 mL/min (ref >60)
Glucose, Bld: 85 mg/dL (ref 70–99)
Potassium: 3.8 mmol/L (ref 3.5–5.1)
Sodium: 138 mmol/L (ref 135–145)

## 2022-12-20 DIAGNOSIS — I5033 Acute on chronic diastolic (congestive) heart failure: Secondary | ICD-10-CM | POA: Diagnosis not present

## 2022-12-20 DIAGNOSIS — E1142 Type 2 diabetes mellitus with diabetic polyneuropathy: Secondary | ICD-10-CM | POA: Diagnosis not present

## 2022-12-20 DIAGNOSIS — I110 Hypertensive heart disease with heart failure: Secondary | ICD-10-CM | POA: Diagnosis not present

## 2022-12-20 DIAGNOSIS — E1151 Type 2 diabetes mellitus with diabetic peripheral angiopathy without gangrene: Secondary | ICD-10-CM | POA: Diagnosis not present

## 2022-12-20 DIAGNOSIS — E7849 Other hyperlipidemia: Secondary | ICD-10-CM | POA: Diagnosis not present

## 2022-12-20 DIAGNOSIS — F32A Depression, unspecified: Secondary | ICD-10-CM | POA: Diagnosis not present

## 2022-12-20 DIAGNOSIS — F419 Anxiety disorder, unspecified: Secondary | ICD-10-CM | POA: Diagnosis not present

## 2022-12-20 DIAGNOSIS — J4489 Other specified chronic obstructive pulmonary disease: Secondary | ICD-10-CM | POA: Diagnosis not present

## 2022-12-20 DIAGNOSIS — E1169 Type 2 diabetes mellitus with other specified complication: Secondary | ICD-10-CM | POA: Diagnosis not present

## 2022-12-22 ENCOUNTER — Ambulatory Visit (HOSPITAL_COMMUNITY)
Admission: RE | Admit: 2022-12-22 | Discharge: 2022-12-22 | Disposition: A | Discharge status: Home / Self Care | Payer: Medicare HMO | Source: Ambulatory Visit | Attending: Family Medicine | Admitting: Family Medicine

## 2022-12-22 DIAGNOSIS — E119 Type 2 diabetes mellitus without complications: Secondary | ICD-10-CM | POA: Insufficient documentation

## 2022-12-22 DIAGNOSIS — I5032 Chronic diastolic (congestive) heart failure: Secondary | ICD-10-CM

## 2022-12-22 DIAGNOSIS — G4730 Sleep apnea, unspecified: Secondary | ICD-10-CM | POA: Insufficient documentation

## 2022-12-22 DIAGNOSIS — I350 Nonrheumatic aortic (valve) stenosis: Secondary | ICD-10-CM | POA: Diagnosis not present

## 2022-12-22 DIAGNOSIS — I10 Essential (primary) hypertension: Secondary | ICD-10-CM | POA: Diagnosis not present

## 2022-12-22 DIAGNOSIS — J4489 Other specified chronic obstructive pulmonary disease: Secondary | ICD-10-CM | POA: Insufficient documentation

## 2022-12-22 DIAGNOSIS — R6889 Other general symptoms and signs: Secondary | ICD-10-CM | POA: Diagnosis not present

## 2022-12-22 LAB — ECHOCARDIOGRAM COMPLETE
AR max vel: 1.03 cm2
AV Area VTI: 1.03 cm2
AV Area mean vel: 1.03 cm2
AV Mean grad: 40 mmHg
AV Peak grad: 65.7 mmHg
Ao pk vel: 4.05 m/s
Area-P 1/2: 2.56 cm2
Calc EF: 73.7 %
MV VTI: 3.55 cm2
S' Lateral: 4.9 cm
Single Plane A2C EF: 71.9 %
Single Plane A4C EF: 75.1 %

## 2022-12-26 DIAGNOSIS — R6889 Other general symptoms and signs: Secondary | ICD-10-CM | POA: Diagnosis not present

## 2022-12-27 ENCOUNTER — Other Ambulatory Visit: Payer: Self-pay | Admitting: Adult Health

## 2022-12-27 ENCOUNTER — Other Ambulatory Visit: Payer: Self-pay | Admitting: Endocrinology

## 2022-12-27 ENCOUNTER — Other Ambulatory Visit (HOSPITAL_COMMUNITY): Payer: Self-pay | Admitting: Cardiology

## 2022-12-27 DIAGNOSIS — G4700 Insomnia, unspecified: Secondary | ICD-10-CM

## 2022-12-28 ENCOUNTER — Other Ambulatory Visit (HOSPITAL_COMMUNITY): Payer: Self-pay | Admitting: Cardiology

## 2022-12-28 NOTE — Telephone Encounter (Signed)
Patient not taking temazepam.

## 2022-12-29 ENCOUNTER — Encounter (HOSPITAL_COMMUNITY): Payer: Self-pay | Admitting: Cardiology

## 2022-12-29 DIAGNOSIS — E1142 Type 2 diabetes mellitus with diabetic polyneuropathy: Secondary | ICD-10-CM | POA: Diagnosis not present

## 2022-12-29 DIAGNOSIS — I5032 Chronic diastolic (congestive) heart failure: Secondary | ICD-10-CM

## 2022-12-29 DIAGNOSIS — I5033 Acute on chronic diastolic (congestive) heart failure: Secondary | ICD-10-CM | POA: Diagnosis not present

## 2022-12-29 DIAGNOSIS — F419 Anxiety disorder, unspecified: Secondary | ICD-10-CM | POA: Diagnosis not present

## 2022-12-29 DIAGNOSIS — E1151 Type 2 diabetes mellitus with diabetic peripheral angiopathy without gangrene: Secondary | ICD-10-CM | POA: Diagnosis not present

## 2022-12-29 DIAGNOSIS — F32A Depression, unspecified: Secondary | ICD-10-CM | POA: Diagnosis not present

## 2022-12-29 DIAGNOSIS — E7849 Other hyperlipidemia: Secondary | ICD-10-CM | POA: Diagnosis not present

## 2022-12-29 DIAGNOSIS — I110 Hypertensive heart disease with heart failure: Secondary | ICD-10-CM | POA: Diagnosis not present

## 2022-12-29 DIAGNOSIS — E1169 Type 2 diabetes mellitus with other specified complication: Secondary | ICD-10-CM | POA: Diagnosis not present

## 2022-12-29 DIAGNOSIS — J4489 Other specified chronic obstructive pulmonary disease: Secondary | ICD-10-CM | POA: Diagnosis not present

## 2022-12-29 MED ORDER — TORSEMIDE 20 MG PO TABS: 100.0000 mg | ORAL_TABLET | Freq: Two times a day (BID) | ORAL | 3 refills | Status: DC

## 2022-12-29 NOTE — Telephone Encounter (Signed)
Returned call to patient Reports increase in SOB(more so nocturnal) despite weekly metolazone. +UOP after metolazone Mild BLE Weight 366 Uses CPAP at night and has needed to elevate hospital bed higher than at night to rest  CardioMEms reading 18 goal 14   Torsemide 80 BID Metolazone weekly Tuesday   Above reviewed with Amy Clegg,NP. Increase torsemide to 100 mg BID Repeat bmet x 1 week Keep follow up x 2 weeks

## 2023-01-04 ENCOUNTER — Ambulatory Visit (HOSPITAL_COMMUNITY)
Admission: RE | Admit: 2023-01-04 | Discharge: 2023-01-04 | Disposition: A | Discharge status: Home / Self Care | Payer: Medicare HMO | Source: Ambulatory Visit | Attending: Internal Medicine | Admitting: Internal Medicine

## 2023-01-04 DIAGNOSIS — R6889 Other general symptoms and signs: Secondary | ICD-10-CM | POA: Diagnosis not present

## 2023-01-04 DIAGNOSIS — I5032 Chronic diastolic (congestive) heart failure: Secondary | ICD-10-CM | POA: Diagnosis not present

## 2023-01-04 LAB — BASIC METABOLIC PANEL
Anion gap: 11 (ref 5–15)
BUN: 20 mg/dL (ref 8–23)
CO2: 29 mmol/L (ref 22–32)
Calcium: 9 mg/dL (ref 8.9–10.3)
Chloride: 96 mmol/L — ABNORMAL LOW (ref 98–111)
Creatinine, Ser: 1.11 mg/dL (ref 0.61–1.24)
GFR, Estimated: >60 mL/min (ref >60)
Glucose, Bld: 155 mg/dL — ABNORMAL HIGH (ref 70–99)
Potassium: 3.7 mmol/L (ref 3.5–5.1)
Sodium: 136 mmol/L (ref 135–145)

## 2023-01-05 ENCOUNTER — Ambulatory Visit (INDEPENDENT_AMBULATORY_CARE_PROVIDER_SITE_OTHER): Payer: Medicare HMO | Admitting: Adult Health

## 2023-01-05 ENCOUNTER — Encounter: Payer: Self-pay | Admitting: Adult Health

## 2023-01-05 DIAGNOSIS — F411 Generalized anxiety disorder: Secondary | ICD-10-CM

## 2023-01-05 DIAGNOSIS — F4310 Post-traumatic stress disorder, unspecified: Secondary | ICD-10-CM

## 2023-01-05 DIAGNOSIS — F410 Panic disorder [episodic paroxysmal anxiety] without agoraphobia: Secondary | ICD-10-CM

## 2023-01-05 DIAGNOSIS — G4700 Insomnia, unspecified: Secondary | ICD-10-CM | POA: Diagnosis not present

## 2023-01-05 DIAGNOSIS — F331 Major depressive disorder, recurrent, moderate: Secondary | ICD-10-CM | POA: Diagnosis not present

## 2023-01-05 DIAGNOSIS — R6889 Other general symptoms and signs: Secondary | ICD-10-CM | POA: Diagnosis not present

## 2023-01-05 NOTE — Progress Notes (Signed)
JUANJOSE VIRAG [id number] February 25, 1956 67 y.o.  Subjective:   Patient ID:  ACETON MACEDA is a 67 y.o. (DOB 11/29/1955) male.  Chief Complaint: No chief complaint on file.   HPI BRILYN KISE presents to the office today for follow-up of panic attacks, MDD, GAD, PTSD, and insomnia.  Previously followed by [place].  Describes mood today as "ok". Pleasant. Reports tearfulness. Mood symptoms - reports depression and anxiety - health related. Reports some irritability at times. Reports some worry, rumination, and over thinking. Reports panic attacks. Mood is lower overall. Stating "I do a lot of worrying about my health". Feels like current medications are helpful. He and partner doing well. Stable interest and motivation. Taking medications as prescribed.  Energy levels varies. Active, does not have a regular exercise routine.   Enjoys some usual interests and activities. Lives with significant other. No family local. Has a good friend locally. Appetite adequate. Weight loss 360 pounds. Sleeps 4 to 5 hours a night. Reports napping "a lot" throughout the day.   Focus and concentration difficulties. Completing tasks. Managing aspects of household. Retired [employer]. Disabled. Denies SI or HI.  Denies AH or VH. Denies self harm. Denies substance use.  Previous medication trials: Ambien   Flowsheet Row ED to Hosp-Admission (Discharged) from 10/26/2022 in [facility] 3E HF PCU ED to Hosp-Admission (Discharged) from 05/25/2022 in [facility] ED from 03/09/2022 in [facility] Emergency Department at [facility]  C-SSRS RISK CATEGORY No Risk No Risk No Risk        Review of Systems:  Review of Systems  Musculoskeletal:  Negative for gait problem.  Neurological:  Negative for tremors.  Psychiatric/Behavioral:         Please refer to HPI    Medications: I have reviewed the patient's current medications.  Current Outpatient Medications  Medication Sig  Dispense Refill   acetaminophen (TYLENOL) 650 MG CR tablet Take 1,300 mg by mouth every 8 (eight) hours as needed for pain.     albuterol (VENTOLIN HFA) 108 (90 Base) MCG/ACT inhaler Inhale 2 puffs into the lungs every 6 (six) hours as needed for wheezing or shortness of breath. 1 each 6   allopurinol (ZYLOPRIM) 100 MG tablet Take 200 mg by mouth 2 (two) times daily.     ARIPiprazole (ABILIFY) 5 MG tablet TAKE ONE TABLET BY MOUTH EVERYDAY AT BEDTIME 90 tablet 3   atorvastatin (LIPITOR) 40 MG tablet Take 1 tablet (40 mg total) by mouth daily. 90 tablet 3   busPIRone (BUSPAR) 10 MG tablet Take 1 tablet (10 mg total) by mouth 3 (three) times daily. 270 tablet 3   cetirizine (ZYRTEC) 10 MG tablet Take 10 mg by mouth at bedtime.      Cholecalciferol (VITAMIN D3 PO) Take 2,000 Units by mouth daily.     dapagliflozin propanediol (FARXIGA) 10 MG TABS tablet TAKE ONE TABLET BY MOUTH BEFORE BREAKFAST 90 tablet 3   diclofenac Sodium (VOLTAREN) 1 % GEL Apply 4 g topically 4 (four) times daily. To right knee. 100 g 0   diltiazem (CARDIZEM CD) 120 MG 24 hr capsule TAKE ONE CAPSULE BY MOUTH ONCE DAILY 30 capsule 2   ELIQUIS 5 MG TABS tablet TAKE ONE TABLET BY MOUTH TWICE DAILY 180 tablet 2   fenofibrate (TRICOR) 145 MG tablet Take 1 tablet (145 mg total) by mouth daily. 90 tablet 3   ferrous sulfate 324 (65 Fe) MG TBEC Take 324 mg by mouth daily  with breakfast.      fluticasone (FLONASE) 50 MCG/ACT nasal spray Place 2 sprays into both nostrils daily.      Fluticasone-Umeclidin-Vilant (TRELEGY ELLIPTA) 200-62.5-25 MCG/ACT AEPB INHALE 1 PUFF BY MOUTH INTO LUNGS ONCE DAILY 60 each 11   insulin aspart (NOVOLOG FLEXPEN) 100 UNIT/ML FlexPen INJECT 10 UNITS BEFORE MEALS, keep sugar TWO HOURS AFTER meals UNDER 180 AT least 15 mL 1   Insulin Pen Needle (B-D ULTRAFINE III SHORT PEN) 31G X 8 MM MISC Inject 1 each into the skin 3 (three) times daily before meals. Use as instructed 4X daily. 100 each 5   Lacosamide 150 MG  TABS Take 1 tablet (150 mg total) by mouth 2 (two) times daily. 180 tablet 3   levETIRAcetam (KEPPRA) 500 MG tablet Take 1 tablet (500 mg total) by mouth 2 (two) times daily. 180 tablet 3   losartan (COZAAR) 25 MG tablet TAKE ONE TABLET BY MOUTH ONCE DAILY 30 tablet 2   metFORMIN (GLUCOPHAGE-XR) 500 MG 24 hr tablet Take 4 tablets (2,000 mg total) by mouth daily. 360 tablet 3   metolazone (ZAROXOLYN) 2.5 MG tablet Take 1 tablet (2.5 mg total) by mouth once a week. Every Tuesday 20 tablet 3   metoprolol succinate (TOPROL-XL) 25 MG 24 hr tablet Take 1 tablet (25 mg total) by mouth daily. 90 tablet 1   mirtazapine (REMERON) 15 MG tablet TAKE ONE TABLET BY MOUTH EVERYDAY AT BEDTIME 30 tablet 2   omeprazole (PRILOSEC) 20 MG capsule Take 20 mg by mouth every morning.     potassium chloride SA (KLOR-CON M) 20 MEQ tablet Take 2 tablets (40 mEq total) by mouth 2 (two) times daily. Except Tuesdays when you take ( 3 Tabs) 270 tablet 3   prazosin (MINIPRESS) 2 MG capsule Take two tablets at bedtime. 180 capsule 3   pregabalin (LYRICA) 150 MG capsule Take 1 capsule (150 mg total) by mouth 3 (three) times daily. 270 capsule 3   repaglinide (PRANDIN) 2 MG tablet TAKE TWO TABLETS BY MOUTH TWICE DAILY BEFORE A meal 120 tablet 0   spironolactone (ALDACTONE) 25 MG tablet Take 0.5 tablets (12.5 mg total) by mouth daily. 45 tablet 3   Tirzepatide (MOUNJARO [place]) Inject 10 mg into the skin once a week.     torsemide (DEMADEX) 20 MG tablet Take 5 tablets (100 mg total) by mouth 2 (two) times daily. 900 tablet 3   traZODone (DESYREL) 100 MG tablet Take 200 mg by mouth at bedtime.     TRESIBA FLEXTOUCH 100 UNIT/ML FlexTouch Pen INJECT 40 UNITS into THE SKIN ONCE DAILY 15 mL 3   VASCEPA 1 g capsule TAKE TWO CAPSULES BY MOUTH TWICE DAILY 120 capsule 5   venlafaxine XR (EFFEXOR-XR) 75 MG 24 hr capsule Take 3 capsules (225 mg total) by mouth daily with breakfast. 270 capsule 3   No current facility-administered  medications for this visit.    Medication Side Effects: None  Allergies:  Allergies  Allergen Reactions   Vancomycin Other (See Comments)    "Red Man Syndrome" 02/02/17: possible cause for rash under both arms   Niacin And Related Other (See Comments)    Red man syndrome   Tubersol [Tuberculin, Ppd] Other (See Comments)    Reaction unknown   Doxycycline Rash and Other (See Comments)    Past Medical History:  Diagnosis Date   Acquired dilation of ascending aorta and aortic root (HCC)    40mm by echo 01/2021   Adenomatous colon polyp  2007   Anemia    Anxiety    Aortic stenosis    moderate AS by echo 01/2021   Asthma    BPH without obstruction/lower urinary tract symptoms 02/22/2017   Chronic diastolic (congestive) heart failure (HCC)    Chronic venous stasis 03/07/2019   COPD (chronic obstructive pulmonary disease) (HCC)    Coronary artery calcification seen on CAT scan    Depression    Diabetic neuropathy (HCC) 09/11/2019   History of colon polyps 08/24/2018   Hypertension    Morbid obesity (HCC)    OSA (obstructive sleep apnea)    Pain due to onychomycosis of toenails of both feet 09/11/2019   Peripheral neuropathy 02/22/2017   Primary osteoarthritis, left shoulder 03/05/2017   PTSD (post-traumatic stress disorder)    Pure hypercholesterolemia    QT prolongation 03/07/2019   Seizures (HCC)    Shortness of breath    Sinus tachycardia 03/07/2019   Sleep apnea    CPAP   Type 2 diabetes mellitus with vascular disease (HCC) 09/11/2019    Past Medical History, Surgical history, Social history, and Family history were reviewed and updated as appropriate.   Please see review of systems for further details on the patient's review from today.   Objective:   Physical Exam:  There were no vitals taken for this visit.  Physical Exam Constitutional:      General: He is not in acute distress. Musculoskeletal:        General: No deformity.  Neurological:     Mental  Status: He is alert and oriented to person, place, and time.     Coordination: Coordination normal.  Psychiatric:        Attention and Perception: Attention and perception normal. He does not perceive auditory or visual hallucinations.        Mood and Affect: Mood normal. Mood is not anxious or depressed. Affect is not labile, blunt, angry or inappropriate.        Speech: Speech normal.        Behavior: Behavior normal.        Thought Content: Thought content normal. Thought content is not paranoid or delusional. Thought content does not include homicidal or suicidal ideation. Thought content does not include homicidal or suicidal plan.        Cognition and Memory: Cognition and memory normal.        Judgment: Judgment normal.     Comments: Insight intact     Lab Review:     Component Value Date/Time   NA 136 01/04/2023 1357   NA 136 01/07/2021 0000   K 3.7 01/04/2023 1357   CL 96 (L) 01/04/2023 1357   CO2 29 01/04/2023 1357   GLUCOSE 155 (H) 01/04/2023 1357   BUN 20 01/04/2023 1357   BUN 22 01/07/2021 0000   CREATININE 1.11 01/04/2023 1357   CALCIUM 9.0 01/04/2023 1357   PROT 7.1 12/05/2022 1514   ALBUMIN 3.6 12/05/2022 1514   AST 19 12/05/2022 1514   ALT 20 12/05/2022 1514   ALKPHOS 78 12/05/2022 1514   BILITOT 0.7 12/05/2022 1514   GFRNONAA >60 01/04/2023 1357   GFRAA 77 05/20/2020 1038       Component Value Date/Time   WBC 11.9 (H) 10/30/2022 0105   RBC 4.85 10/30/2022 0105   HGB 14.1 10/30/2022 0105   HCT 43.3 10/30/2022 0105   PLT 238 10/30/2022 0105   MCV 89.3 10/30/2022 0105   MCH 29.1 10/30/2022 0105   MCHC 32.6 10/30/2022  0105   RDW 14.8 10/30/2022 0105   LYMPHSABS 1.8 05/25/2022 2101   MONOABS 0.7 05/25/2022 2101   EOSABS 0.3 05/25/2022 2101   BASOSABS 0.0 05/25/2022 2101    No results found for: "POCLITH", "LITHIUM"   No results found for: "PHENYTOIN", "PHENOBARB", "VALPROATE", "CBMZ"   .res Assessment: Plan:    Plan:  PDMP  reviewed  Prazosin 2mg  - 2 tabs at hs Abilify 5mg  daily Effexor XR 75mg  - 3 every morning Buspar 5mg  TID  Mirtazapine 15mg  at hs  Trazadone 200mg  at hs  Ricki Rodriguez - [facility] - pain management - spinal stimulator  RTC 3 months  Patient advised to contact office with any questions, adverse effects, or acute worsening in signs and symptoms.  Discussed potential metabolic side effects associated with atypical antipsychotics, as well as potential risk for movement side effects. Advised pt to contact office if movement side effects occur.   Diagnoses and all orders for this visit:  Major depressive disorder, recurrent episode, moderate (HCC)  Generalized anxiety disorder  PTSD (post-traumatic stress disorder)  Panic attacks  Insomnia, unspecified type     Please see After Visit Summary for patient specific instructions.  Future Appointments  Date Time Provider Department Center  01/11/2023  1:20 PM Chuck Hint, MD [facility]  01/12/2023  1:30 PM [facility] PA/NP [facility] None  01/19/2023  8:00 AM Altamese [place], MD [facility] None  03/13/2023  8:15 AM Helane Gunther, DPM [facility]  04/06/2023  9:00 AM Quintella Reichert, MD [facility]  05/04/2023  9:15 AM Windell Norfolk, MD [facility] None    No orders of the defined types were placed in this encounter.   -------------------------------

## 2023-01-11 ENCOUNTER — Encounter: Payer: Self-pay | Admitting: Vascular Surgery

## 2023-01-11 ENCOUNTER — Ambulatory Visit (INDEPENDENT_AMBULATORY_CARE_PROVIDER_SITE_OTHER): Payer: Medicare HMO | Admitting: Vascular Surgery

## 2023-01-11 VITALS — BP 105/76 | HR 87 | Temp 98.0°F | Resp 18 | Ht 74.0 in | Wt 362.0 lb

## 2023-01-11 DIAGNOSIS — R6889 Other general symptoms and signs: Secondary | ICD-10-CM | POA: Diagnosis not present

## 2023-01-11 DIAGNOSIS — I83812 Varicose veins of left lower extremities with pain: Secondary | ICD-10-CM | POA: Diagnosis not present

## 2023-01-11 DIAGNOSIS — I872 Venous insufficiency (chronic) (peripheral): Secondary | ICD-10-CM

## 2023-01-11 NOTE — Progress Notes (Signed)
REASON FOR VISIT:   Follow-up of chronic venous insufficiency  MEDICAL ISSUES:   CHRONIC VENOUS INSUFFICIENCY: This patient has CEAP C5 venous disease.  The wound on his left leg has healed.  He is now undergone laser ablation of the left great saphenous vein.  We were holding off on laser ablation of the anterior accessory saphenous vein given that the wound was healing.  He will continue to elevate his legs and wear his compression stockings.  If the ulcer returns then certainly we could reconsider him for laser ablation of the anterior accessory saphenous vein.  The only other consideration if the ulcer returns would be the pneumatic compression device.  He would like a routine follow-up visit in 6 months which I think is reasonable.  Will have him see one of my partners as I have explained that I will be retiring.  His last venous duplex scan on the left was in March 2024 so this will not need to be repeated in 6 months.  He will call sooner if he has problems.  HPI:   Lawrence [name] is a pleasant 67 y.o. male who I last saw on 12/01/2022.  He had presented with combined chronic venous insufficiency and lymphedema.  He had CEAP C6 venous disease.  He had an ulcer on the lateral aspect of his left leg.  He had failed conservative treatment.  He underwent laser ablation of the left great saphenous vein.  His follow-up study on 12/01/2022 showed no evidence of DVT in the left leg.  The left great saphenous vein was successfully closed from the distal thigh to within 9 mm of the saphenofemoral junction.  The wound on the left leg had improved.  He comes in for a 6-week follow-up visit.  If the wound was not healing the only other option would be to consider laser ablation of the anterior accessory saphenous vein on the left.  The only other consideration would be to get a lymphedema pump.  Since I saw her last, the wound on the lateral aspect of his left leg has healed.  He did develop some  superficial wounds where his thigh-high compression stockings were cutting into his thigh but these are healing.  He has been elevating his legs.  Of note he apparently has significant cardiac valvular disease.  This is being worked up currently.  Past Medical History:  Diagnosis Date   Acquired dilation of ascending aorta and aortic root (HCC)    40mm by echo 01/2021   Adenomatous colon polyp 2007   Anemia    Anxiety    Aortic stenosis    moderate AS by echo 01/2021   Asthma    BPH without obstruction/lower urinary tract symptoms 02/22/2017   Chronic diastolic (congestive) heart failure (HCC)    Chronic venous stasis 03/07/2019   COPD (chronic obstructive pulmonary disease) (HCC)    Coronary artery calcification seen on CAT scan    Depression    Diabetic neuropathy (HCC) 09/11/2019   History of colon polyps 08/24/2018   Hypertension    Morbid obesity (HCC)    OSA (obstructive sleep apnea)    Pain due to onychomycosis of toenails of both feet 09/11/2019   Peripheral neuropathy 02/22/2017   Primary osteoarthritis, left shoulder 03/05/2017   PTSD (post-traumatic stress disorder)    Pure hypercholesterolemia    QT prolongation 03/07/2019   Seizures (HCC)    Shortness of breath    Sinus tachycardia 03/07/2019   Sleep apnea  CPAP   Type 2 diabetes mellitus with vascular disease (HCC) 09/11/2019    Family History  Problem Relation Age of Onset   CAD Maternal Grandfather    Diabetes Other    Diabetes Mellitus II Neg Hx    Colon cancer Neg Hx    Esophageal cancer Neg Hx    Inflammatory bowel disease Neg Hx    Liver disease Neg Hx    Pancreatic cancer Neg Hx    Rectal cancer Neg Hx    Stomach cancer Neg Hx    Sleep apnea Neg Hx     SOCIAL HISTORY: Social History   Tobacco Use   Smoking status: Former    Packs/day: 1.00    Years: 44.00    Additional pack years: 0.00    Total pack years: 44.00    Types: Cigarettes    Quit date: 03/07/2016    Years since quitting:  6.8    Passive exposure: Past   Smokeless tobacco: Never  Substance Use Topics   Alcohol use: Yes    Comment: rare    Allergies  Allergen Reactions   Vancomycin Other (See Comments)    "Red Man Syndrome" 02/02/17: possible cause for rash under both arms   Niacin And Related Other (See Comments)    Red man syndrome   Tubersol [Tuberculin, Ppd] Other (See Comments)    Reaction unknown   Doxycycline Rash and Other (See Comments)    Current Outpatient Medications  Medication Sig Dispense Refill   acetaminophen (TYLENOL) 650 MG CR tablet Take 1,300 mg by mouth every 8 (eight) hours as needed for pain.     albuterol (VENTOLIN HFA) 108 (90 Base) MCG/ACT inhaler Inhale 2 puffs into the lungs every 6 (six) hours as needed for wheezing or shortness of breath. 1 each 6   allopurinol (ZYLOPRIM) 100 MG tablet Take 200 mg by mouth 2 (two) times daily.     ARIPiprazole (ABILIFY) 5 MG tablet TAKE ONE TABLET BY MOUTH EVERYDAY AT BEDTIME 90 tablet 3   atorvastatin (LIPITOR) 40 MG tablet Take 1 tablet (40 mg total) by mouth daily. 90 tablet 3   busPIRone (BUSPAR) 10 MG tablet Take 1 tablet (10 mg total) by mouth 3 (three) times daily. 270 tablet 3   cetirizine (ZYRTEC) 10 MG tablet Take 10 mg by mouth at bedtime.      Cholecalciferol (VITAMIN D3 PO) Take 2,000 Units by mouth daily.     dapagliflozin propanediol (FARXIGA) 10 MG TABS tablet TAKE ONE TABLET BY MOUTH BEFORE BREAKFAST 90 tablet 3   diclofenac Sodium (VOLTAREN) 1 % GEL Apply 4 g topically 4 (four) times daily. To right knee. 100 g 0   diltiazem (CARDIZEM CD) 120 MG 24 hr capsule TAKE ONE CAPSULE BY MOUTH ONCE DAILY 30 capsule 2   ELIQUIS 5 MG TABS tablet TAKE ONE TABLET BY MOUTH TWICE DAILY 180 tablet 2   fenofibrate (TRICOR) 145 MG tablet Take 1 tablet (145 mg total) by mouth daily. 90 tablet 3   ferrous sulfate 324 (65 Fe) MG TBEC Take 324 mg by mouth daily with breakfast.      fluticasone (FLONASE) 50 MCG/ACT nasal spray Place 2 sprays  into both nostrils daily.      Fluticasone-Umeclidin-Vilant (TRELEGY ELLIPTA) 200-62.5-25 MCG/ACT AEPB INHALE 1 PUFF BY MOUTH INTO LUNGS ONCE DAILY 60 each 11   insulin aspart (NOVOLOG FLEXPEN) 100 UNIT/ML FlexPen INJECT 10 UNITS BEFORE MEALS, keep sugar TWO HOURS AFTER meals UNDER 180 AT least 15 mL  1   Insulin Pen Needle (B-D ULTRAFINE III SHORT PEN) 31G X 8 MM MISC Inject 1 each into the skin 3 (three) times daily before meals. Use as instructed 4X daily. 100 each 5   Lacosamide 150 MG TABS Take 1 tablet (150 mg total) by mouth 2 (two) times daily. 180 tablet 3   levETIRAcetam (KEPPRA) 500 MG tablet Take 1 tablet (500 mg total) by mouth 2 (two) times daily. 180 tablet 3   losartan (COZAAR) 25 MG tablet TAKE ONE TABLET BY MOUTH ONCE DAILY 30 tablet 2   metFORMIN (GLUCOPHAGE-XR) 500 MG 24 hr tablet Take 4 tablets (2,000 mg total) by mouth daily. 360 tablet 3   metolazone (ZAROXOLYN) 2.5 MG tablet Take 1 tablet (2.5 mg total) by mouth once a week. Every Tuesday 20 tablet 3   metoprolol succinate (TOPROL-XL) 25 MG 24 hr tablet Take 1 tablet (25 mg total) by mouth daily. 90 tablet 1   mirtazapine (REMERON) 15 MG tablet TAKE ONE TABLET BY MOUTH EVERYDAY AT BEDTIME 30 tablet 2   omeprazole (PRILOSEC) 20 MG capsule Take 20 mg by mouth every morning.     potassium chloride SA (KLOR-CON M) 20 MEQ tablet Take 2 tablets (40 mEq total) by mouth 2 (two) times daily. Except Tuesdays when you take ( 3 Tabs) 270 tablet 3   prazosin (MINIPRESS) 2 MG capsule Take two tablets at bedtime. 180 capsule 3   pregabalin (LYRICA) 150 MG capsule Take 1 capsule (150 mg total) by mouth 3 (three) times daily. 270 capsule 3   repaglinide (PRANDIN) 2 MG tablet TAKE TWO TABLETS BY MOUTH TWICE DAILY BEFORE A meal 120 tablet 0   spironolactone (ALDACTONE) 25 MG tablet Take 0.5 tablets (12.5 mg total) by mouth daily. 45 tablet 3   Tirzepatide (MOUNJARO [place]) Inject 10 mg into the skin once a week.     torsemide (DEMADEX) 20 MG  tablet Take 5 tablets (100 mg total) by mouth 2 (two) times daily. 900 tablet 3   traZODone (DESYREL) 100 MG tablet Take 200 mg by mouth at bedtime.     TRESIBA FLEXTOUCH 100 UNIT/ML FlexTouch Pen INJECT 40 UNITS into THE SKIN ONCE DAILY 15 mL 3   VASCEPA 1 g capsule TAKE TWO CAPSULES BY MOUTH TWICE DAILY 120 capsule 5   venlafaxine XR (EFFEXOR-XR) 75 MG 24 hr capsule Take 3 capsules (225 mg total) by mouth daily with breakfast. 270 capsule 3   No current facility-administered medications for this visit.    REVIEW OF SYSTEMS:  [X]  denotes positive finding, [ ]  denotes negative finding Cardiac  Comments:  Chest pain or chest pressure: x   Shortness of breath upon exertion:    Short of breath when lying flat:    Irregular heart rhythm:        Vascular    Pain in calf, thigh, or hip brought on by ambulation:    Pain in feet at night that wakes you up from your sleep:     Blood clot in your veins:    Leg swelling:  x       Pulmonary    Oxygen at home:    Productive cough:     Wheezing:         Neurologic    Sudden weakness in arms or legs:     Sudden numbness in arms or legs:     Sudden onset of difficulty speaking or slurred speech:    Temporary loss of vision in  one eye:     Problems with dizziness:         Gastrointestinal    Blood in stool:     Vomited blood:         Genitourinary    Burning when urinating:     Blood in urine:        Psychiatric    Major depression:         Hematologic    Bleeding problems:    Problems with blood clotting too easily:        Skin    Rashes or ulcers:        Constitutional    Fever or chills:     PHYSICAL EXAM:   Vitals:   01/11/23 1250  BP: 105/76  Pulse: 87  Resp: 18  Temp: 98 F (36.7 C)  TempSrc: Temporal  SpO2: 91%  Weight: (!) 362 lb (164.2 kg)  Height: 6\' 2"  (1.88 m)    GENERAL: The patient is a well-nourished male, in no acute distress. The vital signs are documented above. CARDIAC: There is a regular rate  and rhythm.  VASCULAR: I do not detect carotid bruits. He has mild bilateral lower extremity swelling. He has hyperpigmentation bilaterally. The wound on the lateral aspect of his left leg has healed. Both feet are warm and well-perfused. PULMONARY: There is good air exchange bilaterally without wheezing or rales. ABDOMEN: Soft and non-tender with normal pitched bowel sounds.  MUSCULOSKELETAL: There are no major deformities or cyanosis. NEUROLOGIC: No focal weakness or paresthesias are detected. SKIN: There are no ulcers or rashes noted. PSYCHIATRIC: The patient has a normal affect.  DATA:    No new data  Waverly Ferrari [facility] [phone]

## 2023-01-12 ENCOUNTER — Ambulatory Visit (HOSPITAL_COMMUNITY)
Admission: RE | Admit: 2023-01-12 | Discharge: 2023-01-12 | Disposition: A | Discharge status: Home / Self Care | Payer: Medicare HMO | Source: Ambulatory Visit | Attending: Physician Assistant | Admitting: Physician Assistant

## 2023-01-12 ENCOUNTER — Encounter (HOSPITAL_COMMUNITY): Payer: Self-pay

## 2023-01-12 VITALS — BP 108/68 | HR 91 | Wt 359.0 lb

## 2023-01-12 DIAGNOSIS — E782 Mixed hyperlipidemia: Secondary | ICD-10-CM | POA: Diagnosis not present

## 2023-01-12 DIAGNOSIS — Z87891 Personal history of nicotine dependence: Secondary | ICD-10-CM | POA: Diagnosis not present

## 2023-01-12 DIAGNOSIS — Z5986 Financial insecurity: Secondary | ICD-10-CM | POA: Diagnosis not present

## 2023-01-12 DIAGNOSIS — Z791 Long term (current) use of non-steroidal anti-inflammatories (NSAID): Secondary | ICD-10-CM | POA: Insufficient documentation

## 2023-01-12 DIAGNOSIS — Z7901 Long term (current) use of anticoagulants: Secondary | ICD-10-CM | POA: Diagnosis not present

## 2023-01-12 DIAGNOSIS — I5032 Chronic diastolic (congestive) heart failure: Secondary | ICD-10-CM | POA: Diagnosis not present

## 2023-01-12 DIAGNOSIS — I110 Hypertensive heart disease with heart failure: Secondary | ICD-10-CM | POA: Diagnosis not present

## 2023-01-12 DIAGNOSIS — Z6841 Body Mass Index (BMI) 40.0 and over, adult: Secondary | ICD-10-CM | POA: Diagnosis not present

## 2023-01-12 DIAGNOSIS — I480 Paroxysmal atrial fibrillation: Secondary | ICD-10-CM | POA: Insufficient documentation

## 2023-01-12 DIAGNOSIS — R569 Unspecified convulsions: Secondary | ICD-10-CM | POA: Insufficient documentation

## 2023-01-12 DIAGNOSIS — E119 Type 2 diabetes mellitus without complications: Secondary | ICD-10-CM | POA: Diagnosis not present

## 2023-01-12 DIAGNOSIS — F32A Depression, unspecified: Secondary | ICD-10-CM | POA: Insufficient documentation

## 2023-01-12 DIAGNOSIS — I350 Nonrheumatic aortic (valve) stenosis: Secondary | ICD-10-CM | POA: Diagnosis not present

## 2023-01-12 DIAGNOSIS — F419 Anxiety disorder, unspecified: Secondary | ICD-10-CM | POA: Insufficient documentation

## 2023-01-12 DIAGNOSIS — R6889 Other general symptoms and signs: Secondary | ICD-10-CM | POA: Diagnosis not present

## 2023-01-12 DIAGNOSIS — E785 Hyperlipidemia, unspecified: Secondary | ICD-10-CM | POA: Diagnosis not present

## 2023-01-12 DIAGNOSIS — Z7984 Long term (current) use of oral hypoglycemic drugs: Secondary | ICD-10-CM | POA: Insufficient documentation

## 2023-01-12 DIAGNOSIS — G4733 Obstructive sleep apnea (adult) (pediatric): Secondary | ICD-10-CM

## 2023-01-12 DIAGNOSIS — Z79899 Other long term (current) drug therapy: Secondary | ICD-10-CM | POA: Insufficient documentation

## 2023-01-12 DIAGNOSIS — Z794 Long term (current) use of insulin: Secondary | ICD-10-CM | POA: Diagnosis not present

## 2023-01-12 DIAGNOSIS — M549 Dorsalgia, unspecified: Secondary | ICD-10-CM | POA: Diagnosis not present

## 2023-01-12 DIAGNOSIS — I872 Venous insufficiency (chronic) (peripheral): Secondary | ICD-10-CM | POA: Insufficient documentation

## 2023-01-12 DIAGNOSIS — I2510 Atherosclerotic heart disease of native coronary artery without angina pectoris: Secondary | ICD-10-CM | POA: Diagnosis not present

## 2023-01-12 LAB — CBC
HCT: 45 % (ref 39.0–52.0)
Hemoglobin: 14.8 g/dL (ref 13.0–17.0)
MCH: 29.6 pg (ref 26.0–34.0)
MCHC: 32.9 g/dL (ref 30.0–36.0)
MCV: 90 fL (ref 80.0–100.0)
Platelets: 286 10*3/uL (ref 150–400)
RBC: 5 MIL/uL (ref 4.22–5.81)
RDW: 15.6 % — ABNORMAL HIGH (ref 11.5–15.5)
WBC: 12.2 10*3/uL — ABNORMAL HIGH (ref 4.0–10.5)
nRBC: 0 % (ref 0.0–0.2)

## 2023-01-12 LAB — BASIC METABOLIC PANEL
Anion gap: 12 (ref 5–15)
BUN: 34 mg/dL — ABNORMAL HIGH (ref 8–23)
CO2: 28 mmol/L (ref 22–32)
Calcium: 9.7 mg/dL (ref 8.9–10.3)
Chloride: 95 mmol/L — ABNORMAL LOW (ref 98–111)
Creatinine, Ser: 1.21 mg/dL (ref 0.61–1.24)
GFR, Estimated: >60 mL/min (ref >60)
Glucose, Bld: 135 mg/dL — ABNORMAL HIGH (ref 70–99)
Potassium: 4.5 mmol/L (ref 3.5–5.1)
Sodium: 135 mmol/L (ref 135–145)

## 2023-01-12 LAB — BRAIN NATRIURETIC PEPTIDE: B Natriuretic Peptide: 19.5 pg/mL (ref 0.0–100.0)

## 2023-01-12 NOTE — Patient Instructions (Addendum)
TAKE Metolazone 2.5 mg on 01/13/2023 with extra 40 meq of potassium  Labs today We will only contact you if something comes back abnormal or we need to make some changes. Otherwise no news is good news!  Your physician recommends that you schedule a follow-up appointment in: 4-5 weeks  in the Advanced Practitioners (PA/NP) Clinic    Do the following things EVERYDAY: Weigh yourself in the morning before breakfast. Write it down and keep it in a log. Take your medicines as prescribed Eat low salt foods--Limit salt (sodium) to 2000 mg per day.  Stay as active as you can everyday Limit all fluids for the day to less than 2 liters  At the [facility], you and your health needs are our priority. As part of our continuing mission to provide you with exceptional heart care, we have created designated Provider Care Teams. These Care Teams include your primary Cardiologist (physician) and Advanced Practice Providers (APPs- Physician Assistants and Nurse Practitioners) who all work together to provide you with the care you need, when you need it.   You may see any of the following providers on your designated Care Team at your next follow up: Dr Arvilla Meres Dr Marca Ancona Dr. Marcos Eke, NP Robbie Lis, [place] [facility] [place], [place] Brynda Peon, NP Karle Plumber, PharmD   Please be sure to bring in all your medications bottles to every appointment.    Thank you for choosing [facility]      You are scheduled for a Cardiac Catheterization on Tuesday, June 25 with Dr. Marca Ancona.  1. Please arrive at the [facility] (Main Entrance A) at [facility]: [address] [place], [place] [address] at 11:30 AM (This time is 2 hour(s) before your procedure to ensure your preparation). Free valet parking service is available. You will check in at ADMITTING. The support person will be asked to  wait in the waiting room.  It is OK to have someone drop you off and come back when you are ready to be discharged.    Special note: Every effort is made to have your procedure done on time. Please understand that emergencies sometimes delay scheduled procedures.  2. Diet: Do not eat solid foods after midnight.  The patient may have clear liquids until 5am upon the day of the procedure.  3. Labs: pre procedure labs drawn 01/12/2023  4. Medication instructions in preparation for your procedure:   Contrast Allergy: No    Stop taking Eliquis (Apixiban) on Sunday, June 23.  Stop taking, Torsemide (Demadex) Tuesday, June 25,  Take only 20 units of insulin the night before your procedure. Do not take any insulin on the day of the procedure.  HOLD your weekly dose of Mounjaro the week prior to procedure (6/16-6/22)   6/17 and 6/24  Do not take Diabetes Med Glucophage (Metformin) on the day of the procedure and HOLD 48 HOURS AFTER THE PROCEDURE.  On the morning of your procedure, take your Aspirin 81 mg and any morning medicines NOT listed above.  You may use sips of water.  5. Plan to go home the same day, you will only stay overnight if medically necessary. 6. Bring a current list of your medications and current insurance cards. 7. You MUST have a responsible person to drive you home. 8. Someone MUST be with you the first 24 hours after you arrive home or your discharge will be delayed.  9. Please wear clothes that are easy to get on and off and wear slip-on shoes.  Thank you for allowing [place] to care for you!   -- [facility]

## 2023-01-12 NOTE — Progress Notes (Addendum)
PCP: [name], PA-C Cardiology: Dr. Mayford Knife HF Cardiology: Dr. Shirlee Latch  67 y.o. with history of paroxysmal atrial fibrillation, OSA/?OHS, morbid obesity, and chronic diastolic CHF was referred by Dr. Mayford Knife for evaluation of CHF.  He uses a walker around the house and a motorized scooter outside the house.   He had a negative cath in 2019 and a negative Cardiolite in 4/22.  Echo in 11/22 was a difficult study but showed EF 60-65%, mild LVH, mildly dilated LV, moderate aortic stenosis (trileaflet aortic valve) with mean gradient 22 mmHg, DI 0.27.   He was admitted in 12/22 with fever/PNA as well as chest pain (ruled out for MI).  He had atrial fibrillation/RVR this admission but converted back to NSR.  He was diuresed. Additionally, he was thought to have a partial seizure and Keppra was restarted.   Cardiomems was placed 08/2021  Admitted 12/15/21 with left facial droop. CT of head negative. Neurology felt this was secondary to breakthrough seizures. Continued on Keppra.   Patient has been seen by [facility] for venous insufficiency, has had laser ablation of the great saphenous vein.   Echo in 10/23 showed EF 60-65%, mild LV dilation, normal RV, moderate AS with mean gradient 28 and AVA around 1 cm^2.    Admitted in 3/24 with CHF and diuresed.   Last seen for f/u 04/29. Cardiomems slightly above goal. Once a week Metolazone was added.   Echo 05/16 EF 60-65%, RV okay, severe aortic valve stenosis with mean gradient of 40 mmHg, AVA by VTI 1.03 cm2  CardioMEMS reading 18 (goal 14) on 05/23. Torsemide was increased to 100 mg BID.   He is here today for follow-up. He has lost 7 lb since Torsemide was increased. His home weight is 259 lb. He has dyspnea with minimal exertion. Gets winded walking 10 feet. He was able to walk around the house with a walker a couple of months ago. His leg edema has improved. Abdomen is distended. Very fatigued during the day. He is using BiPAP nightly. Watching fluid  and sodium intake closely and checking food labels. He is compliant with all medications.   ECG (personally reviewed): SR with 1st degree AVB, 94 bpm, poor R wave progression, inferior Qs  Cardiomems: Goal =14 Reading = 17  Labs (11/22): K 3.7, creatinine 0.89 Labs (1/23): K 3.7, creatinine 0.93 => 0.84, BNP 18 => 37 Labs (03/09/22): K 3.6 Creatinine 1 Labs (3/24): K 3.8, creatinine 0.89, BNP 22 Labs (5/24): K 3.7, creatinine 1.11  PMH: 1. Type 2 diabetes 2. Atrial fibrillation: Paroxysmal.  3. HTN 4. Hyperlipidemia 5. OSA, ?OHS: Uses Bipap.  6. Obesity 7. SVT 8. Chest pain: LHC in 6/19 with minimal CAD.  - Cardiolite (4/22): No ischemia.  9. Depression/anxiety.  10. Chronic diastolic CHF: Echo in 11/22 with EF 60-65%, mild LVH, mildly dilated LV, moderate aortic stenosis (trileaflet aortic valve) with mean gradient 22 mmHg, DI 0.27.  - RHC (1/23): RA mean 16, PA 49/24, mean PCWP 16, CI 3.13, PVR 2 WU.  - Cardomems in place.  - Echo (10/23): EF 60-65%, mild LV dilation, normal RV, moderate AS with mean gradient 28 and AVA around 1 cm^2.  - Echo (5/24) EF 60-65%, RV okay, severe aortic valve stenosis with mean gradient of 40 mmHg, AVA by VTI 1.03 cm2 11. Partial seizures 12. Aortic stenosis: TEE (1/23) with EF 60-65%, mild LVH, mild RVE with normal RV systolic function, mid AI, moderate AS with AVA 1.35 cm^2 and mean gradient 28  mmHg.  - Echo (10/23) with moderate AS with mean gradient 28 and AVA around 1 cm^2. - Echo (5/24) EF 60-65%, RV okay, severe aortic valve stenosis with mean gradient of 40 mmHg, AVA by VTI 1.03 cm2 13. Venous insufficiency: Laser ablation of great saphenous vein.   Social History   Socioeconomic History   Marital status: Significant Other    Spouse name: [name]   Number of children: 1   Years of education: Not on file   Highest education level: Associate degree: academic program  Occupational History   Occupation: retired    Comment: Agricultural engineer  Tobacco Use   Smoking status: Former    Packs/day: 1.00    Years: 44.00    Additional pack years: 0.00    Total pack years: 44.00    Types: Cigarettes    Quit date: 03/07/2016    Years since quitting: 6.8    Passive exposure: Past   Smokeless tobacco: Never  Vaping Use   Vaping Use: Never used  Substance and Sexual Activity   Alcohol use: Yes    Comment: rare   Drug use: No   Sexual activity: Not on file  Other Topics Concern   Not on file  Social History Narrative   Lives with wife [name]   Right Handed   Drinks 1-2 cups caffeine   Social Determinants of Health   Financial Resource Strain: Medium Risk (06/08/2021)   Overall Financial Resource Strain (CARDIA)    Difficulty of Paying Living Expenses: Somewhat hard  Food Insecurity: No Food Insecurity (10/26/2022)   Hunger Vital Sign    Worried About Running Out of Food in the Last Year: Never true    Ran Out of Food in the Last Year: Never true  Transportation Needs: No Transportation Needs (10/26/2022)   PRAPARE - Administrator, Civil Service (Medical): No    Lack of Transportation (Non-Medical): No  Physical Activity: Not on file  Stress: Not on file  Social Connections: Not on file  Intimate Partner Violence: Unknown (10/26/2022)   Humiliation, Afraid, Rape, and Kick questionnaire    Fear of Current or Ex-Partner: No    Emotionally Abused: No    Physically Abused: Not on file    Sexually Abused: No   Family History  Problem Relation Age of Onset   CAD Maternal Grandfather    Diabetes Other    Diabetes Mellitus II Neg Hx    Colon cancer Neg Hx    Esophageal cancer Neg Hx    Inflammatory bowel disease Neg Hx    Liver disease Neg Hx    Pancreatic cancer Neg Hx    Rectal cancer Neg Hx    Stomach cancer Neg Hx    Sleep apnea Neg Hx    ROS: All systems reviewed and negative except as per HPI.   Current Outpatient Medications  Medication Sig Dispense Refill   acetaminophen (TYLENOL) 650  MG CR tablet Take 1,300 mg by mouth every 8 (eight) hours as needed for pain.     albuterol (VENTOLIN HFA) 108 (90 Base) MCG/ACT inhaler Inhale 2 puffs into the lungs every 6 (six) hours as needed for wheezing or shortness of breath. 1 each 6   allopurinol (ZYLOPRIM) 100 MG tablet Take 200 mg by mouth 2 (two) times daily.     ARIPiprazole (ABILIFY) 5 MG tablet TAKE ONE TABLET BY MOUTH EVERYDAY AT BEDTIME 90 tablet 3   atorvastatin (LIPITOR) 40 MG tablet Take 1 tablet (  40 mg total) by mouth daily. 90 tablet 3   busPIRone (BUSPAR) 10 MG tablet Take 1 tablet (10 mg total) by mouth 3 (three) times daily. 270 tablet 3   cetirizine (ZYRTEC) 10 MG tablet Take 10 mg by mouth at bedtime.      Cholecalciferol (VITAMIN D3 PO) Take 2,000 Units by mouth daily.     dapagliflozin propanediol (FARXIGA) 10 MG TABS tablet TAKE ONE TABLET BY MOUTH BEFORE BREAKFAST 90 tablet 3   diclofenac Sodium (VOLTAREN) 1 % GEL Apply 4 g topically 4 (four) times daily. To right knee. 100 g 0   diltiazem (CARDIZEM CD) 120 MG 24 hr capsule TAKE ONE CAPSULE BY MOUTH ONCE DAILY 30 capsule 2   ELIQUIS 5 MG TABS tablet TAKE ONE TABLET BY MOUTH TWICE DAILY 180 tablet 2   fenofibrate (TRICOR) 145 MG tablet Take 1 tablet (145 mg total) by mouth daily. 90 tablet 3   ferrous sulfate 324 (65 Fe) MG TBEC Take 324 mg by mouth daily with breakfast.      fluticasone (FLONASE) 50 MCG/ACT nasal spray Place 2 sprays into both nostrils daily.      Fluticasone-Umeclidin-Vilant (TRELEGY ELLIPTA) 200-62.5-25 MCG/ACT AEPB INHALE 1 PUFF BY MOUTH INTO LUNGS ONCE DAILY 60 each 11   insulin aspart (NOVOLOG FLEXPEN) 100 UNIT/ML FlexPen INJECT 10 UNITS BEFORE MEALS, keep sugar TWO HOURS AFTER meals UNDER 180 AT least 15 mL 1   Insulin Pen Needle (B-D ULTRAFINE III SHORT PEN) 31G X 8 MM MISC Inject 1 each into the skin 3 (three) times daily before meals. Use as instructed 4X daily. 100 each 5   Lacosamide 150 MG TABS Take 1 tablet (150 mg total) by mouth 2  (two) times daily. 180 tablet 3   levETIRAcetam (KEPPRA) 500 MG tablet Take 1 tablet (500 mg total) by mouth 2 (two) times daily. 180 tablet 3   losartan (COZAAR) 25 MG tablet TAKE ONE TABLET BY MOUTH ONCE DAILY 30 tablet 2   metFORMIN (GLUCOPHAGE-XR) 500 MG 24 hr tablet Take 4 tablets (2,000 mg total) by mouth daily. 360 tablet 3   metolazone (ZAROXOLYN) 2.5 MG tablet Take 1 tablet (2.5 mg total) by mouth once a week. Every Tuesday 20 tablet 3   metoprolol succinate (TOPROL-XL) 25 MG 24 hr tablet Take 1 tablet (25 mg total) by mouth daily. 90 tablet 1   mirtazapine (REMERON) 15 MG tablet TAKE ONE TABLET BY MOUTH EVERYDAY AT BEDTIME 30 tablet 2   omeprazole (PRILOSEC) 20 MG capsule Take 20 mg by mouth every morning.     potassium chloride SA (KLOR-CON M) 20 MEQ tablet Take 2 tablets (40 mEq total) by mouth 2 (two) times daily. Except Tuesdays when you take ( 3 Tabs) 270 tablet 3   prazosin (MINIPRESS) 2 MG capsule Take two tablets at bedtime. 180 capsule 3   pregabalin (LYRICA) 150 MG capsule Take 1 capsule (150 mg total) by mouth 3 (three) times daily. 270 capsule 3   repaglinide (PRANDIN) 2 MG tablet TAKE TWO TABLETS BY MOUTH TWICE DAILY BEFORE A meal 120 tablet 0   spironolactone (ALDACTONE) 25 MG tablet Take 0.5 tablets (12.5 mg total) by mouth daily. 45 tablet 3   Tirzepatide (MOUNJARO Neponset) Inject 10 mg into the skin once a week.     torsemide (DEMADEX) 20 MG tablet Take 5 tablets (100 mg total) by mouth 2 (two) times daily. 900 tablet 3   traZODone (DESYREL) 100 MG tablet Take 200 mg by mouth  at bedtime.     TRESIBA FLEXTOUCH 100 UNIT/ML FlexTouch Pen INJECT 40 UNITS into THE SKIN ONCE DAILY 15 mL 3   VASCEPA 1 g capsule TAKE TWO CAPSULES BY MOUTH TWICE DAILY 120 capsule 5   venlafaxine XR (EFFEXOR-XR) 75 MG 24 hr capsule Take 3 capsules (225 mg total) by mouth daily with breakfast. 270 capsule 3   No current facility-administered medications for this encounter.   BP 108/68   Pulse  91   Wt (!) 162.8 kg (359 lb)   SpO2 90%   BMI 46.09 kg/m  Wt Readings from Last 3 Encounters:  01/12/23 (!) 162.8 kg (359 lb)  01/11/23 (!) 164.2 kg (362 lb)  12/05/22 (!) 163.3 kg (360 lb)   General: Arrived to clinic in a scooter. No distress.  HEENT: normal Neck: supple. JVP difficult d/t thick neck. Carotids 2+ bilat; no bruits.  Cor: PMI nondisplaced. Regular rate & rhythm. No rubs, gallops, 3/6 crescendo decrescendo systolic murmur RUSB Lungs: clear Abdomen: obese, soft, nontender, + distended.  Extremities: no cyanosis, clubbing, rash, 1+ edema, chronic venous stasis changes b/l LE Neuro: alert & orientedx3. Affect pleasant   Assessment/Plan: 1. Chronic diastolic CHF: Echo (11/22) with EF 60-65%, mild LVH, mildly dilated LV, moderate aortic stenosis (trileaflet aortic valve) with mean gradient 22 mmHg, DI 0.27.  Echo in 10/23 with EF 60-65%, mild LV dilation, normal RV, moderate AS with mean gradient 28 and AVA around 1 cm^2. Echo 12/22/22: EF 60-65%, RV okay, severe AS with mean gradient of 40 mmHg and AVA 1.03 cm2.  - Symptoms are confounded by morbid obesity, back pain, and deconditioning.  NYHA class IIIb.  Cardiomems is mildly above goal and he is reporting more dyspnea (some of which is probably d/t severe AS). - Continue Torsemide 100 mg BID. Continue metolazone 2.5 mg once weekly. Take 2.5 mg metolazone tomorrow am with an extra 40 mEq potassium. May require metolazone twice weekly.  - Continue dapagliflozin 10 daily.  - Continue spironolactone 12.5 mg daily. 2. Aortic stenosis: Moderate AS on TEE in 1/23 and echo in 10/23.  - Severe AS on recent echo 12/22/22. EF is preserved.  - Will likely need TAVR. Discussed with Dr. Shirlee Latch. Will arrange for [facility] to start workup and plan for referral to [facility] once completed. 3. Atrial fibrillation: Paroxysmal. NSR today.  - Continue apixaban.   - Continue Toprol XL and diltiazem CD.  4. OSA/?OHS: Using Bipap nightly.   5. Obesity: Body mass index is 46.09 kg/m. - Continue tirzepatide, he has not lost weight yet with this.  6. Hyperlipidemia: On statin and vascepa.  - LDL 60 and triglycerides 239 in 04/24. - Continue current management. 7. Seizures  - On Keppra per Neurology  8. DM II: On insulin - A1c 6.9% in 01/24 - Continue Farxiga -  Follow-up: 4 weeks with APP to assess volume  Andreia Gandolfi N  01/12/2023

## 2023-01-12 NOTE — Addendum Note (Signed)
Encounter addended by: Theresia Bough, CMA on: 01/12/2023 2:32 PM  Actions taken: Clinical Note Signed

## 2023-01-16 ENCOUNTER — Telehealth (HOSPITAL_COMMUNITY): Payer: Self-pay | Admitting: Adult Health

## 2023-01-16 ENCOUNTER — Encounter (HOSPITAL_COMMUNITY): Payer: Self-pay | Admitting: Cardiology

## 2023-01-16 NOTE — Telephone Encounter (Signed)
  Cardiomems Remote Monitoring  S/P Cardiomems Implant 08/26/2021   PAD Goal: 14 Most recent reading:18 suggestive of fluid accumulation    Recommended changes: Please call. Instruct to take metolazone today. Sending reading tomorrow and I will review.   I continue to review and analyze the patients PA pressures weekly (and more often as needed) to bring PA pressures within the optimal range.      Charlize Hathaway NP-C  1:44 PM

## 2023-01-19 ENCOUNTER — Encounter: Payer: Self-pay | Admitting: "Endocrinology

## 2023-01-19 ENCOUNTER — Ambulatory Visit (INDEPENDENT_AMBULATORY_CARE_PROVIDER_SITE_OTHER): Payer: Medicare HMO | Admitting: "Endocrinology

## 2023-01-19 VITALS — BP 130/85 | HR 99 | Ht 74.0 in | Wt 360.0 lb

## 2023-01-19 DIAGNOSIS — Z794 Long term (current) use of insulin: Secondary | ICD-10-CM | POA: Diagnosis not present

## 2023-01-19 DIAGNOSIS — E782 Mixed hyperlipidemia: Secondary | ICD-10-CM | POA: Diagnosis not present

## 2023-01-19 DIAGNOSIS — Z7985 Long-term (current) use of injectable non-insulin antidiabetic drugs: Secondary | ICD-10-CM | POA: Diagnosis not present

## 2023-01-19 DIAGNOSIS — E1159 Type 2 diabetes mellitus with other circulatory complications: Secondary | ICD-10-CM | POA: Diagnosis not present

## 2023-01-19 DIAGNOSIS — Z7984 Long term (current) use of oral hypoglycemic drugs: Secondary | ICD-10-CM | POA: Diagnosis not present

## 2023-01-19 DIAGNOSIS — R6889 Other general symptoms and signs: Secondary | ICD-10-CM | POA: Diagnosis not present

## 2023-01-19 LAB — POCT GLYCOSYLATED HEMOGLOBIN (HGB A1C): Hemoglobin A1C: 6.4 % — AB (ref 4.0–5.6)

## 2023-01-19 MED ORDER — TIRZEPATIDE 12.5 MG/0.5ML ~~LOC~~ SOAJ: 12.5000 mg | SUBCUTANEOUS | 0 refills | Status: DC

## 2023-01-19 NOTE — Progress Notes (Signed)
Outpatient Endocrinology Note [facility], MD  01/19/23   [name] 1955/10/05 [id number]  Referring Provider: [name], PA-C Primary Care Provider: [name], PA-C Reason for consultation: Subjective   Assessment & Plan  Diagnoses and all orders for this visit:  Type 2 diabetes mellitus with vascular disease (HCC) -     POCT glycosylated hemoglobin (Hb A1C)  Long-term insulin use (HCC)  Long term (current) use of oral hypoglycemic drugs  Long-term (current) use of injectable non-insulin antidiabetic drugs  Mixed hypercholesterolemia and hypertriglyceridemia  Other orders -     tirzepatide (MOUNJARO) 12.5 MG/0.5ML Pen; Inject 12.5 mg into the skin once a week.    Diabetes complicated by stroke Hba1c goal less than 7.0, current Hba1c is 6.4. Will recommend the following: Farxiga 10 mg daily, metformin ER 2 g daily, Prandin 4 mg twice daily, Mounjaro 12.5 mg weekly Insulin regimen: Tresiba 40 units daily, Novolog 12 units tidac   Wants to stay on current regimen except dose escalation as needed   No known contraindications to any of above medications  Hyperlipidemia -Last LDL at goal: 60 -atorvastatin 40 mg qd, vascepa 1 gm 2 pills bid -Follow low fat diet and exercise    -Blood pressure goal <140/90 - Microalbumin/creatinine at goal < 30 - on ACE/ARB losartan 25 mg qd -diet changes including salt restriction -limit eating outside -counseled BP targets per standards of diabetes care -Uncontrolled blood pressure can lead to retinopathy, nephropathy and cardiovascular and atherosclerotic heart disease  Reviewed and counseled on: -A1C target -Blood sugar targets -Complications of uncontrolled diabetes  -Checking blood sugar before meals and bedtime and bring log next visit -All medications with mechanism of action and side effects -Hypoglycemia management: rule of 15's, Glucagon Emergency Kit and medical alert ID -low-carb  low-fat plate-method diet -At least 20 minutes of physical activity per day -Annual dilated retinal eye exam and foot exam -compliance and follow up needs -follow up as scheduled or earlier if problem gets worse  Call if blood sugar is less than 70 or consistently above 250    Take a 15 gm snack of carbohydrate at bedtime before you go to sleep if your blood sugar is less than 100.    If you are going to fast after midnight for a test or procedure, ask your physician for instructions on how to reduce/decrease your insulin dose.    Call if blood sugar is less than 70 or consistently above 250  -Treating a low sugar by rule of 15  (15 gms of sugar every 15 min until sugar is more than 70) If you feel your sugar is low, test your sugar to be sure If your sugar is low (less than 70), then take 15 grams of a fast acting Carbohydrate (3-4 glucose tablets or glucose gel or 4 ounces of juice or regular soda) Recheck your sugar 15 min after treating low to make sure it is more than 70 If sugar is still less than 70, treat again with 15 grams of carbohydrate          Don't drive the hour of hypoglycemia  If unconscious/unable to eat or drink by mouth, use glucagon injection or nasal spray baqsimi and call 911. Can repeat again in 15 min if still unconscious.  Return in about 4 months (around 05/21/2023).   I have reviewed current medications, nurse's notes, allergies, vital signs, past medical and surgical history, family medical history, and social history for this encounter.  Counseled patient on symptoms, examination findings, lab findings, imaging results, treatment decisions and monitoring and prognosis. The patient understood the recommendations and agrees with the treatment plan. All questions regarding treatment plan were fully answered.  [facility], MD  01/19/23    History of Present Illness SAJAN CHEATWOOD is a 67 y.o. year old male who presents for evaluation of Type 2 diabetes  mellitus.  HUEL CENTOLA was first diagnosed in 2018.    Home diabetes regimen: Farxiga 10 mg daily, metformin ER 2 g daily, Prandin 4 mg twice daily, Mounjaro 10 mg weekly Insulin regimen: Tresiba 40 units daily, Novolog 12 units tidac   Previous history: A1c in the past has ranged from 7.1-9.4  He has been on progressively increasing number of medications for his diabetes over the last 5 years or so Insulin was apparently used with small doses of insulin for 2 months in 2022 Usually A1c has not been below 7  COMPLICATIONS -  MI, + multiple stroke -  retinopathy +  neuropathy -  nephropathy  BLOOD SUGAR DATA  CGM interpretation: At today's visit, we reviewed her CGM downloads. The full report is scanned in the media. Reviewing the CGM trends, BG trending level except one time low BG in 63  Physical Exam  BP 130/85   Pulse 99   Ht 6\' 2"  (1.88 m)   Wt (!) 360 lb (163.3 kg)   SpO2 93%   BMI 46.22 kg/m    Constitutional: well developed, well nourished Head: normocephalic, atraumatic Eyes: sclera anicteric, no redness Neck: supple Lungs: normal respiratory effort Neurology: alert and oriented Skin: dry, no appreciable rashes Musculoskeletal: no appreciable defects Psychiatric: normal mood and affect  Current Medications Patient's Medications  New Prescriptions   TIRZEPATIDE (MOUNJARO) 12.5 MG/0.5ML PEN    Inject 12.5 mg into the skin once a week.  Previous Medications   ACETAMINOPHEN (TYLENOL) 650 MG CR TABLET    Take 1,300 mg by mouth every 8 (eight) hours as needed for pain.   ALBUTEROL (VENTOLIN HFA) 108 (90 BASE) MCG/ACT INHALER    Inhale 2 puffs into the lungs every 6 (six) hours as needed for wheezing or shortness of breath.   ALLOPURINOL (ZYLOPRIM) 100 MG TABLET    Take 200 mg by mouth 2 (two) times daily.   ARIPIPRAZOLE (ABILIFY) 5 MG TABLET    TAKE ONE TABLET BY MOUTH EVERYDAY AT BEDTIME   ATORVASTATIN (LIPITOR) 40 MG TABLET    Take 1 tablet (40 mg total)  by mouth daily.   BUSPIRONE (BUSPAR) 10 MG TABLET    Take 1 tablet (10 mg total) by mouth 3 (three) times daily.   CETIRIZINE (ZYRTEC) 10 MG TABLET    Take 10 mg by mouth at bedtime.    CHOLECALCIFEROL (VITAMIN D3 PO)    Take 2,000 Units by mouth daily.   DAPAGLIFLOZIN PROPANEDIOL (FARXIGA) 10 MG TABS TABLET    TAKE ONE TABLET BY MOUTH BEFORE BREAKFAST   DICLOFENAC SODIUM (VOLTAREN) 1 % GEL    Apply 4 g topically 4 (four) times daily. To right knee.   DILTIAZEM (CARDIZEM CD) 120 MG 24 HR CAPSULE    TAKE ONE CAPSULE BY MOUTH ONCE DAILY   ELIQUIS 5 MG TABS TABLET    TAKE ONE TABLET BY MOUTH TWICE DAILY   FENOFIBRATE (TRICOR) 145 MG TABLET    Take 1 tablet (145 mg total) by mouth daily.   FERROUS SULFATE 324 (65 FE) MG TBEC    Take 324  mg by mouth daily with breakfast.    FLUTICASONE (FLONASE) 50 MCG/ACT NASAL SPRAY    Place 2 sprays into both nostrils daily.    FLUTICASONE-UMECLIDIN-VILANT (TRELEGY ELLIPTA) 200-62.5-25 MCG/ACT AEPB    INHALE 1 PUFF BY MOUTH INTO LUNGS ONCE DAILY   INSULIN ASPART (NOVOLOG FLEXPEN) 100 UNIT/ML FLEXPEN    INJECT 10 UNITS BEFORE MEALS, keep sugar TWO HOURS AFTER meals UNDER 180 AT least   INSULIN PEN NEEDLE (B-D ULTRAFINE III SHORT PEN) 31G X 8 MM MISC    Inject 1 each into the skin 3 (three) times daily before meals. Use as instructed 4X daily.   LACOSAMIDE 150 MG TABS    Take 1 tablet (150 mg total) by mouth 2 (two) times daily.   LEVETIRACETAM (KEPPRA) 500 MG TABLET    Take 1 tablet (500 mg total) by mouth 2 (two) times daily.   LOSARTAN (COZAAR) 25 MG TABLET    TAKE ONE TABLET BY MOUTH ONCE DAILY   METFORMIN (GLUCOPHAGE-XR) 500 MG 24 HR TABLET    Take 4 tablets (2,000 mg total) by mouth daily.   METOLAZONE (ZAROXOLYN) 2.5 MG TABLET    Take 1 tablet (2.5 mg total) by mouth once a week. Every Tuesday   METOPROLOL SUCCINATE (TOPROL-XL) 25 MG 24 HR TABLET    Take 1 tablet (25 mg total) by mouth daily.   MIRTAZAPINE (REMERON) 15 MG TABLET    TAKE ONE TABLET BY MOUTH  EVERYDAY AT BEDTIME   OMEPRAZOLE (PRILOSEC) 20 MG CAPSULE    Take 20 mg by mouth every morning.   POTASSIUM CHLORIDE SA (KLOR-CON M) 20 MEQ TABLET    Take 2 tablets (40 mEq total) by mouth 2 (two) times daily. Except Tuesdays when you take ( 3 Tabs)   PRAZOSIN (MINIPRESS) 2 MG CAPSULE    Take two tablets at bedtime.   PREGABALIN (LYRICA) 150 MG CAPSULE    Take 1 capsule (150 mg total) by mouth 3 (three) times daily.   REPAGLINIDE (PRANDIN) 2 MG TABLET    TAKE TWO TABLETS BY MOUTH TWICE DAILY BEFORE A meal   SPIRONOLACTONE (ALDACTONE) 25 MG TABLET    Take 0.5 tablets (12.5 mg total) by mouth daily.   TORSEMIDE (DEMADEX) 20 MG TABLET    Take 5 tablets (100 mg total) by mouth 2 (two) times daily.   TRAZODONE (DESYREL) 100 MG TABLET    Take 200 mg by mouth at bedtime.   TRESIBA FLEXTOUCH 100 UNIT/ML FLEXTOUCH PEN    INJECT 40 UNITS into THE SKIN ONCE DAILY   VASCEPA 1 G CAPSULE    TAKE TWO CAPSULES BY MOUTH TWICE DAILY   VENLAFAXINE XR (EFFEXOR-XR) 75 MG 24 HR CAPSULE    Take 3 capsules (225 mg total) by mouth daily with breakfast.  Modified Medications   No medications on file  Discontinued Medications   TIRZEPATIDE (MOUNJARO [place])    Inject 10 mg into the skin once a week.    Allergies Allergies  Allergen Reactions   Vancomycin Other (See Comments)    "Red Man Syndrome" 02/02/17: possible cause for rash under both arms   Niacin And Related Other (See Comments)    Red man syndrome   Tubersol [Tuberculin, Ppd] Other (See Comments)    Reaction unknown   Doxycycline Rash and Other (See Comments)    Past Medical History Past Medical History:  Diagnosis Date   Acquired dilation of ascending aorta and aortic root (HCC)    40mm by echo 01/2021  Adenomatous colon polyp 2007   Anemia    Anxiety    Aortic stenosis    moderate AS by echo 01/2021   Asthma    BPH without obstruction/lower urinary tract symptoms 02/22/2017   Chronic diastolic (congestive) heart failure (HCC)    Chronic  venous stasis 03/07/2019   COPD (chronic obstructive pulmonary disease) (HCC)    Coronary artery calcification seen on CAT scan    Depression    Diabetic neuropathy (HCC) 09/11/2019   History of colon polyps 08/24/2018   Hypertension    Morbid obesity (HCC)    OSA (obstructive sleep apnea)    Pain due to onychomycosis of toenails of both feet 09/11/2019   Peripheral neuropathy 02/22/2017   Primary osteoarthritis, left shoulder 03/05/2017   PTSD (post-traumatic stress disorder)    Pure hypercholesterolemia    QT prolongation 03/07/2019   Seizures (HCC)    Shortness of breath    Sinus tachycardia 03/07/2019   Sleep apnea    CPAP   Type 2 diabetes mellitus with vascular disease (HCC) 09/11/2019    Past Surgical History Past Surgical History:  Procedure Laterality Date   ENDOVENOUS ABLATION SAPHENOUS VEIN W/ LASER Right 08/20/2020   endovenous laser ablation right greater saphenous vein by Cari Caraway MD    ENDOVENOUS ABLATION SAPHENOUS VEIN W/ LASER Left 11/16/2022   endovenous laser ablation left greater saphenous vein by Cari Caraway MD   JOINT REPLACEMENT     left knee replacement x 2   KNEE ARTHROSCOPY Bilateral    LEFT HEART CATH AND CORONARY ANGIOGRAPHY N/A 01/17/2018   Procedure: LEFT HEART CATH AND CORONARY ANGIOGRAPHY;  Surgeon: Marykay Lex, MD;  Location: [facility] INVASIVE CV LAB;  Service: Cardiovascular;  Laterality: N/A;   PRESSURE SENSOR/CARDIOMEMS N/A 08/26/2021   Procedure: PRESSURE SENSOR/CARDIOMEMS;  Surgeon: Laurey Morale, MD;  Location: [facility] INVASIVE CV LAB;  Service: Cardiovascular;  Laterality: N/A;   RIGHT HEART CATH N/A 08/26/2021   Procedure: RIGHT HEART CATH;  Surgeon: Laurey Morale, MD;  Location: [facility] INVASIVE CV LAB;  Service: Cardiovascular;  Laterality: N/A;   TEE WITHOUT CARDIOVERSION N/A 08/26/2021   Procedure: TRANSESOPHAGEAL ECHOCARDIOGRAM (TEE);  Surgeon: Laurey Morale, MD;  Location[facility] ENDOSCOPY;  Service: Cardiovascular;  Laterality:  N/A;   UMBILICAL HERNIA REPAIR      Family History family history includes CAD in his maternal grandfather; Diabetes in an other family member.  Social History Social History   Socioeconomic History   Marital status: Significant Other    Spouse name: [name]   Number of children: 1   Years of education: Not on file   Highest education level: Associate degree: academic program  Occupational History   Occupation: retired    Comment: Building control surveyor  Tobacco Use   Smoking status: Former    Packs/day: 1.00    Years: 44.00    Additional pack years: 0.00    Total pack years: 44.00    Types: Cigarettes    Quit date: 03/07/2016    Years since quitting: 6.8    Passive exposure: Past   Smokeless tobacco: Never  Vaping Use   Vaping Use: Never used  Substance and Sexual Activity   Alcohol use: Yes    Comment: rare   Drug use: No   Sexual activity: Not on file  Other Topics Concern   Not on file  Social History Narrative   Lives with wife [name]   Right Handed   Drinks 1-2 cups caffeine  Social Determinants of Health   Financial Resource Strain: Medium Risk (06/08/2021)   Overall Financial Resource Strain (CARDIA)    Difficulty of Paying Living Expenses: Somewhat hard  Food Insecurity: No Food Insecurity (10/26/2022)   Hunger Vital Sign    Worried About Running Out of Food in the Last Year: Never true    Ran Out of Food in the Last Year: Never true  Transportation Needs: No Transportation Needs (10/26/2022)   PRAPARE - Administrator, Civil Service (Medical): No    Lack of Transportation (Non-Medical): No  Physical Activity: Not on file  Stress: Not on file  Social Connections: Not on file  Intimate Partner Violence: Unknown (10/26/2022)   Humiliation, Afraid, Rape, and Kick questionnaire    Fear of Current or Ex-Partner: No    Emotionally Abused: No    Physically Abused: Not on file    Sexually Abused: No    Lab Results  Component Value Date    HGBA1C 6.4 (A) 01/19/2023   HGBA1C 6.9 09/05/2022   HGBA1C 7.2 (H) 05/26/2022   Lab Results  Component Value Date   CHOL 139 12/05/2022   Lab Results  Component Value Date   HDL 31 (L) 12/05/2022   Lab Results  Component Value Date   LDLCALC 60 12/05/2022   Lab Results  Component Value Date   TRIG 239 (H) 12/05/2022   Lab Results  Component Value Date   CHOLHDL 4.5 12/05/2022   Lab Results  Component Value Date   CREATININE 1.21 01/12/2023   No results found for: "GFR" Lab Results  Component Value Date   MICROALBUR <0.7 03/23/2022      Component Value Date/Time   NA 135 01/12/2023 1405   NA 136 01/07/2021 0000   K 4.5 01/12/2023 1405   CL 95 (L) 01/12/2023 1405   CO2 28 01/12/2023 1405   GLUCOSE 135 (H) 01/12/2023 1405   BUN 34 (H) 01/12/2023 1405   BUN 22 01/07/2021 0000   CREATININE 1.21 01/12/2023 1405   CALCIUM 9.7 01/12/2023 1405   PROT 7.1 12/05/2022 1514   ALBUMIN 3.6 12/05/2022 1514   AST 19 12/05/2022 1514   ALT 20 12/05/2022 1514   ALKPHOS 78 12/05/2022 1514   BILITOT 0.7 12/05/2022 1514   GFRNONAA >60 01/12/2023 1405   GFRAA 77 05/20/2020 1038      Latest Ref Rng & Units 01/12/2023    2:05 PM 01/04/2023    1:57 PM 12/15/2022    9:01 AM  BMP  Glucose 70 - 99 mg/dL 161  096  85   BUN 8 - 23 mg/dL 34  20  26   Creatinine 0.61 - 1.24 mg/dL 0.45  4.09  8.11   Sodium 135 - 145 mmol/L 135  136  138   Potassium 3.5 - 5.1 mmol/L 4.5  3.7  3.8   Chloride 98 - 111 mmol/L 95  96  99   CO2 22 - 32 mmol/L 28  29  32   Calcium 8.9 - 10.3 mg/dL 9.7  9.0  8.8        Component Value Date/Time   WBC 12.2 (H) 01/12/2023 1405   RBC 5.00 01/12/2023 1405   HGB 14.8 01/12/2023 1405   HCT 45.0 01/12/2023 1405   PLT 286 01/12/2023 1405   MCV 90.0 01/12/2023 1405   MCH 29.6 01/12/2023 1405   MCHC 32.9 01/12/2023 1405   RDW 15.6 (H) 01/12/2023 1405   LYMPHSABS 1.8 05/25/2022 2101  MONOABS 0.7 05/25/2022 2101   EOSABS 0.3 05/25/2022 2101   BASOSABS 0.0  05/25/2022 2101     Parts of this note may have been dictated using voice recognition software. There may be variances in spelling and vocabulary which are unintentional. Not all errors are proofread. Please notify the Thereasa Parkin if any discrepancies are noted or if the meaning of any statement is not clear.

## 2023-01-19 NOTE — Patient Instructions (Signed)

## 2023-01-20 ENCOUNTER — Telehealth: Payer: Self-pay

## 2023-01-20 NOTE — Telephone Encounter (Signed)
Mr Serven is calling stating that with the Novolog instructions he does not have enough to last but 3 to 4 more days taking it 10 units tid [facility]

## 2023-01-22 ENCOUNTER — Other Ambulatory Visit: Payer: Self-pay

## 2023-01-22 ENCOUNTER — Inpatient Hospital Stay (HOSPITAL_COMMUNITY)
Admission: EM | Admit: 2023-01-22 | Discharge: 2023-02-08 | DRG: 266 | Disposition: A | Discharge status: Home Health | Payer: Medicare HMO | Attending: Internal Medicine | Admitting: Internal Medicine

## 2023-01-22 ENCOUNTER — Encounter (HOSPITAL_COMMUNITY): Payer: Self-pay

## 2023-01-22 ENCOUNTER — Emergency Department (HOSPITAL_COMMUNITY): Payer: Medicare HMO

## 2023-01-22 DIAGNOSIS — I452 Bifascicular block: Secondary | ICD-10-CM | POA: Diagnosis present

## 2023-01-22 DIAGNOSIS — Z952 Presence of prosthetic heart valve: Secondary | ICD-10-CM | POA: Diagnosis not present

## 2023-01-22 DIAGNOSIS — J9621 Acute and chronic respiratory failure with hypoxia: Secondary | ICD-10-CM | POA: Diagnosis not present

## 2023-01-22 DIAGNOSIS — I4891 Unspecified atrial fibrillation: Secondary | ICD-10-CM | POA: Diagnosis not present

## 2023-01-22 DIAGNOSIS — N400 Enlarged prostate without lower urinary tract symptoms: Secondary | ICD-10-CM | POA: Diagnosis present

## 2023-01-22 DIAGNOSIS — E7800 Pure hypercholesterolemia, unspecified: Secondary | ICD-10-CM | POA: Diagnosis present

## 2023-01-22 DIAGNOSIS — I10 Essential (primary) hypertension: Secondary | ICD-10-CM | POA: Diagnosis present

## 2023-01-22 DIAGNOSIS — E1165 Type 2 diabetes mellitus with hyperglycemia: Secondary | ICD-10-CM | POA: Diagnosis not present

## 2023-01-22 DIAGNOSIS — G2581 Restless legs syndrome: Secondary | ICD-10-CM | POA: Diagnosis present

## 2023-01-22 DIAGNOSIS — I272 Pulmonary hypertension, unspecified: Secondary | ICD-10-CM | POA: Diagnosis present

## 2023-01-22 DIAGNOSIS — K029 Dental caries, unspecified: Secondary | ICD-10-CM | POA: Diagnosis not present

## 2023-01-22 DIAGNOSIS — M25561 Pain in right knee: Secondary | ICD-10-CM | POA: Diagnosis present

## 2023-01-22 DIAGNOSIS — R079 Chest pain, unspecified: Secondary | ICD-10-CM | POA: Diagnosis present

## 2023-01-22 DIAGNOSIS — Z9981 Dependence on supplemental oxygen: Secondary | ICD-10-CM

## 2023-01-22 DIAGNOSIS — I5032 Chronic diastolic (congestive) heart failure: Secondary | ICD-10-CM | POA: Diagnosis present

## 2023-01-22 DIAGNOSIS — F32A Depression, unspecified: Secondary | ICD-10-CM | POA: Diagnosis present

## 2023-01-22 DIAGNOSIS — I714 Abdominal aortic aneurysm, without rupture, unspecified: Secondary | ICD-10-CM | POA: Diagnosis present

## 2023-01-22 DIAGNOSIS — Z6841 Body Mass Index (BMI) 40.0 and over, adult: Secondary | ICD-10-CM | POA: Diagnosis not present

## 2023-01-22 DIAGNOSIS — I110 Hypertensive heart disease with heart failure: Secondary | ICD-10-CM | POA: Diagnosis present

## 2023-01-22 DIAGNOSIS — I2782 Chronic pulmonary embolism: Secondary | ICD-10-CM | POA: Diagnosis present

## 2023-01-22 DIAGNOSIS — R001 Bradycardia, unspecified: Secondary | ICD-10-CM | POA: Diagnosis not present

## 2023-01-22 DIAGNOSIS — M1712 Unilateral primary osteoarthritis, left knee: Secondary | ICD-10-CM | POA: Diagnosis not present

## 2023-01-22 DIAGNOSIS — Z01818 Encounter for other preprocedural examination: Secondary | ICD-10-CM | POA: Diagnosis not present

## 2023-01-22 DIAGNOSIS — I493 Ventricular premature depolarization: Secondary | ICD-10-CM | POA: Diagnosis not present

## 2023-01-22 DIAGNOSIS — J449 Chronic obstructive pulmonary disease, unspecified: Secondary | ICD-10-CM | POA: Diagnosis not present

## 2023-01-22 DIAGNOSIS — J439 Emphysema, unspecified: Secondary | ICD-10-CM | POA: Diagnosis present

## 2023-01-22 DIAGNOSIS — I447 Left bundle-branch block, unspecified: Secondary | ICD-10-CM | POA: Diagnosis not present

## 2023-01-22 DIAGNOSIS — I5033 Acute on chronic diastolic (congestive) heart failure: Secondary | ICD-10-CM | POA: Diagnosis not present

## 2023-01-22 DIAGNOSIS — L03119 Cellulitis of unspecified part of limb: Secondary | ICD-10-CM

## 2023-01-22 DIAGNOSIS — R519 Headache, unspecified: Secondary | ICD-10-CM | POA: Diagnosis not present

## 2023-01-22 DIAGNOSIS — Z8249 Family history of ischemic heart disease and other diseases of the circulatory system: Secondary | ICD-10-CM

## 2023-01-22 DIAGNOSIS — Z66 Do not resuscitate: Secondary | ICD-10-CM | POA: Diagnosis present

## 2023-01-22 DIAGNOSIS — I2510 Atherosclerotic heart disease of native coronary artery without angina pectoris: Secondary | ICD-10-CM | POA: Diagnosis not present

## 2023-01-22 DIAGNOSIS — R531 Weakness: Secondary | ICD-10-CM | POA: Diagnosis not present

## 2023-01-22 DIAGNOSIS — J432 Centrilobular emphysema: Secondary | ICD-10-CM | POA: Diagnosis not present

## 2023-01-22 DIAGNOSIS — E876 Hypokalemia: Secondary | ICD-10-CM | POA: Diagnosis not present

## 2023-01-22 DIAGNOSIS — I480 Paroxysmal atrial fibrillation: Secondary | ICD-10-CM | POA: Diagnosis present

## 2023-01-22 DIAGNOSIS — Z006 Encounter for examination for normal comparison and control in clinical research program: Secondary | ICD-10-CM | POA: Diagnosis not present

## 2023-01-22 DIAGNOSIS — Z1152 Encounter for screening for COVID-19: Secondary | ICD-10-CM | POA: Diagnosis not present

## 2023-01-22 DIAGNOSIS — I350 Nonrheumatic aortic (valve) stenosis: Secondary | ICD-10-CM | POA: Diagnosis not present

## 2023-01-22 DIAGNOSIS — J9601 Acute respiratory failure with hypoxia: Secondary | ICD-10-CM | POA: Diagnosis not present

## 2023-01-22 DIAGNOSIS — J4489 Other specified chronic obstructive pulmonary disease: Secondary | ICD-10-CM | POA: Diagnosis present

## 2023-01-22 DIAGNOSIS — E662 Morbid (severe) obesity with alveolar hypoventilation: Secondary | ICD-10-CM | POA: Diagnosis not present

## 2023-01-22 DIAGNOSIS — E785 Hyperlipidemia, unspecified: Secondary | ICD-10-CM | POA: Diagnosis not present

## 2023-01-22 DIAGNOSIS — I712 Thoracic aortic aneurysm, without rupture, unspecified: Secondary | ICD-10-CM | POA: Diagnosis present

## 2023-01-22 DIAGNOSIS — R0902 Hypoxemia: Principal | ICD-10-CM

## 2023-01-22 DIAGNOSIS — Z794 Long term (current) use of insulin: Secondary | ICD-10-CM

## 2023-01-22 DIAGNOSIS — Z8673 Personal history of transient ischemic attack (TIA), and cerebral infarction without residual deficits: Secondary | ICD-10-CM

## 2023-01-22 DIAGNOSIS — Z79899 Other long term (current) drug therapy: Secondary | ICD-10-CM

## 2023-01-22 DIAGNOSIS — R911 Solitary pulmonary nodule: Secondary | ICD-10-CM | POA: Diagnosis not present

## 2023-01-22 DIAGNOSIS — E1142 Type 2 diabetes mellitus with diabetic polyneuropathy: Secondary | ICD-10-CM | POA: Diagnosis present

## 2023-01-22 DIAGNOSIS — G40909 Epilepsy, unspecified, not intractable, without status epilepticus: Secondary | ICD-10-CM | POA: Diagnosis present

## 2023-01-22 DIAGNOSIS — K5730 Diverticulosis of large intestine without perforation or abscess without bleeding: Secondary | ICD-10-CM | POA: Diagnosis not present

## 2023-01-22 DIAGNOSIS — I517 Cardiomegaly: Secondary | ICD-10-CM | POA: Diagnosis not present

## 2023-01-22 DIAGNOSIS — Z87891 Personal history of nicotine dependence: Secondary | ICD-10-CM

## 2023-01-22 DIAGNOSIS — I4510 Unspecified right bundle-branch block: Secondary | ICD-10-CM

## 2023-01-22 DIAGNOSIS — G4733 Obstructive sleep apnea (adult) (pediatric): Secondary | ICD-10-CM | POA: Diagnosis not present

## 2023-01-22 DIAGNOSIS — F4310 Post-traumatic stress disorder, unspecified: Secondary | ICD-10-CM | POA: Diagnosis present

## 2023-01-22 DIAGNOSIS — I440 Atrioventricular block, first degree: Secondary | ICD-10-CM | POA: Diagnosis present

## 2023-01-22 DIAGNOSIS — E1169 Type 2 diabetes mellitus with other specified complication: Secondary | ICD-10-CM | POA: Diagnosis present

## 2023-01-22 DIAGNOSIS — R0602 Shortness of breath: Secondary | ICD-10-CM | POA: Diagnosis not present

## 2023-01-22 DIAGNOSIS — J9622 Acute and chronic respiratory failure with hypercapnia: Secondary | ICD-10-CM | POA: Diagnosis present

## 2023-01-22 DIAGNOSIS — I509 Heart failure, unspecified: Secondary | ICD-10-CM | POA: Diagnosis not present

## 2023-01-22 DIAGNOSIS — Z7985 Long-term (current) use of injectable non-insulin antidiabetic drugs: Secondary | ICD-10-CM

## 2023-01-22 DIAGNOSIS — Z7901 Long term (current) use of anticoagulants: Secondary | ICD-10-CM

## 2023-01-22 DIAGNOSIS — F419 Anxiety disorder, unspecified: Secondary | ICD-10-CM | POA: Diagnosis present

## 2023-01-22 DIAGNOSIS — M549 Dorsalgia, unspecified: Secondary | ICD-10-CM | POA: Diagnosis present

## 2023-01-22 DIAGNOSIS — E119 Type 2 diabetes mellitus without complications: Secondary | ICD-10-CM | POA: Diagnosis present

## 2023-01-22 DIAGNOSIS — M542 Cervicalgia: Secondary | ICD-10-CM | POA: Diagnosis not present

## 2023-01-22 DIAGNOSIS — R0789 Other chest pain: Secondary | ICD-10-CM | POA: Diagnosis not present

## 2023-01-22 DIAGNOSIS — I060 Rheumatic aortic stenosis: Secondary | ICD-10-CM | POA: Diagnosis present

## 2023-01-22 DIAGNOSIS — J9602 Acute respiratory failure with hypercapnia: Secondary | ICD-10-CM | POA: Diagnosis present

## 2023-01-22 DIAGNOSIS — Z96652 Presence of left artificial knee joint: Secondary | ICD-10-CM | POA: Diagnosis present

## 2023-01-22 DIAGNOSIS — Z881 Allergy status to other antibiotic agents status: Secondary | ICD-10-CM

## 2023-01-22 DIAGNOSIS — G8929 Other chronic pain: Secondary | ICD-10-CM | POA: Diagnosis present

## 2023-01-22 DIAGNOSIS — R42 Dizziness and giddiness: Secondary | ICD-10-CM | POA: Diagnosis not present

## 2023-01-22 DIAGNOSIS — K0850 Unsatisfactory restoration of tooth, unspecified: Secondary | ICD-10-CM | POA: Diagnosis not present

## 2023-01-22 DIAGNOSIS — K7689 Other specified diseases of liver: Secondary | ICD-10-CM | POA: Diagnosis not present

## 2023-01-22 DIAGNOSIS — Z7984 Long term (current) use of oral hypoglycemic drugs: Secondary | ICD-10-CM

## 2023-01-22 DIAGNOSIS — Z7401 Bed confinement status: Secondary | ICD-10-CM | POA: Diagnosis not present

## 2023-01-22 DIAGNOSIS — Z888 Allergy status to other drugs, medicaments and biological substances status: Secondary | ICD-10-CM

## 2023-01-22 DIAGNOSIS — K219 Gastro-esophageal reflux disease without esophagitis: Secondary | ICD-10-CM | POA: Diagnosis present

## 2023-01-22 HISTORY — DX: Nonrheumatic aortic (valve) stenosis: I35.0

## 2023-01-22 LAB — CBC
HCT: 44.2 % (ref 39.0–52.0)
Hemoglobin: 14.1 g/dL (ref 13.0–17.0)
MCH: 29.4 pg (ref 26.0–34.0)
MCHC: 31.9 g/dL (ref 30.0–36.0)
MCV: 92.1 fL (ref 80.0–100.0)
Platelets: 269 10*3/uL (ref 150–400)
RBC: 4.8 MIL/uL (ref 4.22–5.81)
RDW: 15.6 % — ABNORMAL HIGH (ref 11.5–15.5)
WBC: 13 10*3/uL — ABNORMAL HIGH (ref 4.0–10.5)
nRBC: 0 % (ref 0.0–0.2)

## 2023-01-22 LAB — BASIC METABOLIC PANEL
Anion gap: 10 (ref 5–15)
BUN: 24 mg/dL — ABNORMAL HIGH (ref 8–23)
CO2: 30 mmol/L (ref 22–32)
Calcium: 8.6 mg/dL — ABNORMAL LOW (ref 8.9–10.3)
Chloride: 99 mmol/L (ref 98–111)
Creatinine, Ser: 1 mg/dL (ref 0.61–1.24)
GFR, Estimated: >60 mL/min (ref >60)
Glucose, Bld: 91 mg/dL (ref 70–99)
Potassium: 3.2 mmol/L — ABNORMAL LOW (ref 3.5–5.1)
Sodium: 139 mmol/L (ref 135–145)

## 2023-01-22 LAB — BRAIN NATRIURETIC PEPTIDE: B Natriuretic Peptide: 25.3 pg/mL (ref 0.0–100.0)

## 2023-01-22 LAB — TROPONIN I (HIGH SENSITIVITY): Troponin I (High Sensitivity): 8 ng/L (ref <18)

## 2023-01-22 MED ORDER — CEPHALEXIN 500 MG PO CAPS: 500.0000 mg | ORAL_CAPSULE | Freq: Four times a day (QID) | ORAL | 0 refills | Status: DC

## 2023-01-22 MED ORDER — POTASSIUM CHLORIDE CRYS ER 20 MEQ PO TBCR
40.0000 meq | EXTENDED_RELEASE_TABLET | Freq: Once | ORAL | Status: AC
  Administered 2023-01-22: 40 meq via ORAL
  Filled 2023-01-22: qty 2

## 2023-01-22 NOTE — ED Triage Notes (Signed)
Pt arrived from home via [facility] c/o SOB and chest pain radiating to right neck and right shoulder. Pt states that this began approx 2 weeks ago and is gradually getting worse.

## 2023-01-22 NOTE — ED Provider Notes (Signed)
 [place] [facility] AT [facility] Provider Note   CSN: [id number] Arrival date & time: 01/22/23  2021     History  Chief Complaint  Patient presents with   Chest Pain   Shortness of Breath    Lawrence [name] is a 67 y.o. male history of type 2 diabetes, CAD, CHF, diabetic neuropathy, seizures, hypertension, aortic stenosis, paroxysmal A-fib on Eliquis , dilation of ascending aorta and aortic root, COPD, stroke presented with chest pain shortness of breath of the past 2 weeks.  Patient states that the left side of his chest hurts and that it goes to his left arm.  Patient also notes that both of his legs appear swollen as well.  Patient dates he does not know what causes the chest pain or what alleviates it.  Patient denies any recent fever, travel, history of cancer.  Patient states that his chest pain shortness of breath has been progressively worse over the past 2 weeks.  Patient denied hemoptysis, nausea/vomiting, abdominal pain, fevers, headache, vision changes, jaw pain, changes sensation/motor skills, new onset weakness  Triage note states that patient on arrival stated he was having weakness and that his chest was drained to his right neck and right shoulder however patient did not endorse this with me.  Home Medications Prior to Admission medications   Medication Sig Start Date End Date Taking? Authorizing Provider  cephALEXin  (KEFLEX ) 500 MG capsule Take 1 capsule (500 mg total) by mouth 4 (four) times daily. 01/22/23  Yes Yuritzy Zehring, [name], PA-C  acetaminophen  (TYLENOL ) 650 MG CR tablet Take 1,300 mg by mouth every 8 (eight) hours as needed for pain.    [provider]  albuterol  (VENTOLIN  HFA) 108 (90 Base) MCG/ACT inhaler Inhale 2 puffs into the lungs every 6 (six) hours as needed for wheezing or shortness of breath. 08/18/22   Hunsucker, Archer Kobs, MD  allopurinol  (ZYLOPRIM ) 100 MG tablet Take 200 mg by mouth 2 (two) times daily.    [provider]  ARIPiprazole  (ABILIFY ) 5 MG tablet TAKE ONE TABLET BY MOUTH EVERYDAY AT BEDTIME 03/09/22   Mozingo, Regina Nattalie, NP  atorvastatin  (LIPITOR) 40 MG tablet Take 1 tablet (40 mg total) by mouth daily. 10/12/22   Von Grumbling, PA-C  busPIRone  (BUSPAR ) 10 MG tablet Take 1 tablet (10 mg total) by mouth 3 (three) times daily. 03/09/22   Mozingo, Regina Nattalie, NP  cetirizine (ZYRTEC) 10 MG tablet Take 10 mg by mouth at bedtime.     [provider]  Cholecalciferol  (VITAMIN D3 PO) Take 2,000 Units by mouth daily.    [provider]  dapagliflozin  propanediol (FARXIGA ) 10 MG TABS tablet TAKE ONE TABLET BY MOUTH BEFORE BREAKFAST 08/05/22   Lajean Pike, MD  diclofenac  Sodium (VOLTAREN ) 1 % GEL Apply 4 g topically 4 (four) times daily. To right knee. 10/31/22   Arrien, Curlee Doss, MD  diltiazem  (CARDIZEM  CD) 120 MG 24 hr capsule TAKE ONE CAPSULE BY MOUTH ONCE DAILY 12/27/22   McLean, Dalton S, MD  ELIQUIS  5 MG TABS tablet TAKE ONE TABLET BY MOUTH TWICE DAILY 08/05/22   McLean, Dalton S, MD  fenofibrate  (TRICOR ) 145 MG tablet Take 1 tablet (145 mg total) by mouth daily. 10/12/22   Von Grumbling, PA-C  ferrous sulfate  324 (65 Fe) MG TBEC Take 324 mg by mouth daily with breakfast.     [provider]  fluticasone  (FLONASE ) 50 MCG/ACT nasal spray Place 2 sprays into both nostrils daily.  [provider]  Fluticasone -Umeclidin-Vilant (TRELEGY ELLIPTA ) 200-62.5-25 MCG/ACT AEPB INHALE 1 PUFF BY MOUTH INTO LUNGS ONCE DAILY 08/18/22   Hunsucker, Archer Kobs, MD  insulin  aspart (NOVOLOG  FLEXPEN) 100 UNIT/ML FlexPen INJECT 10 UNITS BEFORE MEALS, keep sugar TWO HOURS AFTER meals UNDER 180 AT least 12/27/22   Lajean Pike, MD  Insulin  Pen Needle (B-D ULTRAFINE III SHORT PEN) 31G X 8 MM MISC Inject 1 each into the skin 3 (three) times daily before meals. Use as instructed 4X daily. 12/06/22   Lajean Pike, MD  Lacosamide  150 MG TABS Take 1 tablet (150 mg total) by mouth 2  (two) times daily. 10/11/22   Camara, Amadou, MD  levETIRAcetam  (KEPPRA ) 500 MG tablet Take 1 tablet (500 mg total) by mouth 2 (two) times daily. 10/11/22   Camara, Amadou, MD  losartan  (COZAAR ) 25 MG tablet TAKE ONE TABLET BY MOUTH ONCE DAILY 12/28/22   McLean, Dalton S, MD  metFORMIN  (GLUCOPHAGE -XR) 500 MG 24 hr tablet Take 4 tablets (2,000 mg total) by mouth daily. 06/16/21   Gwyndolyn Lerner, MD  metolazone  (ZAROXOLYN ) 2.5 MG tablet Take 1 tablet (2.5 mg total) by mouth once a week. Every Tuesday 12/05/22 03/05/23  Darlis Eisenmenger, MD  metoprolol  succinate (TOPROL -XL) 25 MG 24 hr tablet Take 1 tablet (25 mg total) by mouth daily. 09/08/22   Darlis Eisenmenger, MD  mirtazapine  (REMERON ) 15 MG tablet TAKE ONE TABLET BY MOUTH EVERYDAY AT BEDTIME 12/28/22   Mozingo, Regina Nattalie, NP  omeprazole (PRILOSEC) 20 MG capsule Take 20 mg by mouth every morning. 05/29/20   [provider]  potassium chloride  SA (KLOR-CON  M) 20 MEQ tablet Take 2 tablets (40 mEq total) by mouth 2 (two) times daily. Except Tuesdays when you take ( 3 Tabs) 12/05/22   Darlis Eisenmenger, MD  prazosin  (MINIPRESS ) 2 MG capsule Take two tablets at bedtime. 03/09/22   Mozingo, Regina Nattalie, NP  pregabalin  (LYRICA ) 150 MG capsule Take 1 capsule (150 mg total) by mouth 3 (three) times daily. 10/11/22   Camara, Amadou, MD  repaglinide  (PRANDIN ) 2 MG tablet TAKE TWO TABLETS BY MOUTH TWICE DAILY BEFORE A meal 10/04/22   Lajean Pike, MD  spironolactone  (ALDACTONE ) 25 MG tablet Take 0.5 tablets (12.5 mg total) by mouth daily. 11/16/22   Jacqueline Matsu, MD  tirzepatide  (MOUNJARO ) 12.5 MG/0.5ML Pen Inject 12.5 mg into the skin once a week. 01/19/23   Motwani, Komal, MD  torsemide  (DEMADEX ) 20 MG tablet Take 5 tablets (100 mg total) by mouth 2 (two) times daily. 12/29/22   Clegg, Amy D, NP  traZODone  (DESYREL ) 100 MG tablet Take 200 mg by mouth at bedtime. 10/18/22   [provider]  TRESIBA  FLEXTOUCH 100 UNIT/ML FlexTouch Pen INJECT 40  UNITS into THE SKIN ONCE DAILY 10/14/22   Lajean Pike, MD  VASCEPA  1 g capsule TAKE TWO CAPSULES BY MOUTH TWICE DAILY 08/05/22   McLean, Dalton S, MD  venlafaxine  XR (EFFEXOR -XR) 75 MG 24 hr capsule Take 3 capsules (225 mg total) by mouth daily with breakfast. 03/09/22   Mozingo, Regina Nattalie, NP      Allergies    Vancomycin ; Niacin and related; Tubersol [tuberculin, ppd]; and Doxycycline     Review of Systems   Review of Systems  Respiratory:  Positive for shortness of breath.   Cardiovascular:  Positive for chest pain.    Physical Exam Updated Vital Signs BP (!) 108/56   Pulse 90   Temp 97.6 F (36.4 C) (Oral)  Resp 16   Ht 6\' 2"  (1.88 m)   Wt (!) 163.3 kg   SpO2 93%   BMI 46.22 kg/m  Physical Exam Vitals reviewed.  Constitutional:      General: He is not in acute distress. HENT:     Head: Normocephalic and atraumatic.  Eyes:     Extraocular Movements: Extraocular movements intact.     Conjunctiva/sclera: Conjunctivae normal.     Pupils: Pupils are equal, round, and reactive to light.  Cardiovascular:     Rate and Rhythm: Normal rate and regular rhythm.     Pulses: Normal pulses.     Heart sounds: Normal heart sounds.     Comments: 2+ bilateral radial/dorsalis pedis pulses with regular rate Pulmonary:     Effort: Pulmonary effort is normal. No respiratory distress.     Breath sounds: Normal breath sounds.  Abdominal:     Palpations: Abdomen is soft.     Tenderness: There is no abdominal tenderness. There is no guarding or rebound.  Musculoskeletal:        General: Normal range of motion.     Cervical back: Normal range of motion and neck supple.     Comments: 5 out of 5 bilateral grip/leg extension strength 2+ bilateral pitting edema  Skin:    General: Skin is warm and dry.     Capillary Refill: Capillary refill takes less than 2 seconds.     Comments: Erythema noted to bilateral lower legs  Neurological:     General: No focal deficit present.     Mental  Status: He is alert and oriented to person, place, and time.     Comments: Sensation intact in all 4 limbs  Psychiatric:        Mood and Affect: Mood normal.     ED Results / Procedures / Treatments   Labs (all labs ordered are listed, but only abnormal results are displayed) Labs Reviewed  BASIC METABOLIC PANEL - Abnormal; Notable for the following components:      Result Value   Potassium 3.2 (*)    BUN 24 (*)    Calcium  8.6 (*)    All other components within normal limits  CBC - Abnormal; Notable for the following components:   WBC 13.0 (*)    RDW 15.6 (*)    All other components within normal limits  BRAIN NATRIURETIC PEPTIDE  TROPONIN I (HIGH SENSITIVITY)  TROPONIN I (HIGH SENSITIVITY)    EKG None  Radiology DG Chest 2 View  Result Date: 01/22/2023 CLINICAL DATA:  Pain and shortness of breath EXAM: CHEST - 2 VIEW COMPARISON:  10/26/2022 FINDINGS: Cardiac shadow is enlarged. The overall inspiratory effort is within normal limits. No focal infiltrate is seen. No effusion is noted. IMPRESSION: No acute abnormality noted. Electronically Signed   By: Violeta Grey M.D.   On: 01/22/2023 21:33    Procedures Procedures    Medications Ordered in ED Medications  potassium chloride  SA (KLOR-CON  M) CR tablet 40 mEq (40 mEq Oral Given 01/22/23 2345)    ED Course/ Medical Decision Making/ A&P                             Medical Decision Making Amount and/or Complexity of Data Reviewed Labs: ordered. Radiology: ordered.   [name] 67 y.o. presented today for chest pain, shortness of breath. Working DDx that I considered at this time includes, but not limited to, ACS, GERD,  pulmonary embolism, community-acquired pneumonia, aortic dissection, pneumothorax, underlying bony abnormality, anemia, thyrotoxicosis, esophageal rupture, pneumonia, lung cancer.    R/o Dx: Cannot be fully determined at this time Aortic Dissection: less likely based on the location, quality,  onset, and severity of symptoms in this case. Patient also has a lack of underlying history of AD or TAA.   Review of prior external notes: 10/31/2022 discharge summary  Unique Tests and My Interpretation:  EKG: Rate, rhythm, axis, intervals all examined and without medically relevant abnormality. ST segments without concerns for elevations Troponin: 8 BNP: Negative CXR: No acute cardiopulmonary changes CTA chest PE: CBC: Leukocytosis 13 BMP: Hypokalemia 3.2  Discussion with Independent Historian: None  Discussion of Management of Tests: None  Risk: Medium: prescription drug management  Risk Stratification Score: None  Plan: Patient presented for chest pain. On exam patient was in no acute distress with stable vitals. Patient's physical was remarkable for being on 3 L of oxygen.  Patient is normally on 2 L of oxygen at home.  Patient also had bilateral 2+ pitting edema with overlying erythema in both lower legs indicative of possible cellulitis.  The rest the patient's physical exam was unremarkable as he had good pulses motor sensation all 4 limbs. Labs and CXR will be ordered.  Due to patient's risk factors a CTA of his chest was ordered to evaluate for possible PE.  Patient potassium was noted to be 3.2 and so patient will be given potassium to replenish his potassium.  Patient's white count is 13 however this is on par for his baseline and low suspicion for infectious process as patient does not febrile and endorse any infectious symptoms.  Patient stable at this time.  Patient signed out to Atlee Leach, PA-C.  Pending PE study patient may be discharged on Keflex  with outpatient follow-up for suspected cellulitis.         Final Clinical Impression(s) / ED Diagnoses Final diagnoses:  Hypokalemia  Cellulitis of lower extremity, unspecified laterality    Rx / DC Orders ED Discharge Orders          Ordered    cephALEXin  (KEFLEX ) 500 MG capsule  4 times daily         01/22/23 2347              Denese Finn, PA-C 01/22/23 2347    Albertus Hughs, DO 01/24/23 1501

## 2023-01-23 ENCOUNTER — Encounter (HOSPITAL_COMMUNITY): Payer: Self-pay | Admitting: Cardiology

## 2023-01-23 ENCOUNTER — Emergency Department (HOSPITAL_COMMUNITY): Payer: Medicare HMO

## 2023-01-23 DIAGNOSIS — J9601 Acute respiratory failure with hypoxia: Secondary | ICD-10-CM

## 2023-01-23 DIAGNOSIS — I509 Heart failure, unspecified: Secondary | ICD-10-CM | POA: Diagnosis not present

## 2023-01-23 DIAGNOSIS — E1169 Type 2 diabetes mellitus with other specified complication: Secondary | ICD-10-CM

## 2023-01-23 DIAGNOSIS — I5033 Acute on chronic diastolic (congestive) heart failure: Secondary | ICD-10-CM | POA: Diagnosis present

## 2023-01-23 DIAGNOSIS — I517 Cardiomegaly: Secondary | ICD-10-CM | POA: Diagnosis not present

## 2023-01-23 DIAGNOSIS — R0602 Shortness of breath: Secondary | ICD-10-CM | POA: Diagnosis not present

## 2023-01-23 DIAGNOSIS — J449 Chronic obstructive pulmonary disease, unspecified: Secondary | ICD-10-CM

## 2023-01-23 DIAGNOSIS — I480 Paroxysmal atrial fibrillation: Secondary | ICD-10-CM | POA: Diagnosis present

## 2023-01-23 DIAGNOSIS — J9621 Acute and chronic respiratory failure with hypoxia: Secondary | ICD-10-CM | POA: Diagnosis present

## 2023-01-23 DIAGNOSIS — E785 Hyperlipidemia, unspecified: Secondary | ICD-10-CM

## 2023-01-23 DIAGNOSIS — E662 Morbid (severe) obesity with alveolar hypoventilation: Secondary | ICD-10-CM | POA: Diagnosis present

## 2023-01-23 DIAGNOSIS — I447 Left bundle-branch block, unspecified: Secondary | ICD-10-CM | POA: Diagnosis not present

## 2023-01-23 DIAGNOSIS — R911 Solitary pulmonary nodule: Secondary | ICD-10-CM | POA: Diagnosis not present

## 2023-01-23 DIAGNOSIS — I493 Ventricular premature depolarization: Secondary | ICD-10-CM | POA: Diagnosis not present

## 2023-01-23 DIAGNOSIS — I2782 Chronic pulmonary embolism: Secondary | ICD-10-CM | POA: Diagnosis present

## 2023-01-23 DIAGNOSIS — E1165 Type 2 diabetes mellitus with hyperglycemia: Secondary | ICD-10-CM | POA: Diagnosis not present

## 2023-01-23 DIAGNOSIS — G40909 Epilepsy, unspecified, not intractable, without status epilepticus: Secondary | ICD-10-CM

## 2023-01-23 DIAGNOSIS — E1142 Type 2 diabetes mellitus with diabetic polyneuropathy: Secondary | ICD-10-CM | POA: Diagnosis present

## 2023-01-23 DIAGNOSIS — I110 Hypertensive heart disease with heart failure: Secondary | ICD-10-CM | POA: Diagnosis present

## 2023-01-23 DIAGNOSIS — Z006 Encounter for examination for normal comparison and control in clinical research program: Secondary | ICD-10-CM | POA: Diagnosis not present

## 2023-01-23 DIAGNOSIS — Z1152 Encounter for screening for COVID-19: Secondary | ICD-10-CM | POA: Diagnosis not present

## 2023-01-23 DIAGNOSIS — I5032 Chronic diastolic (congestive) heart failure: Secondary | ICD-10-CM

## 2023-01-23 DIAGNOSIS — J439 Emphysema, unspecified: Secondary | ICD-10-CM | POA: Diagnosis present

## 2023-01-23 DIAGNOSIS — Z794 Long term (current) use of insulin: Secondary | ICD-10-CM | POA: Diagnosis not present

## 2023-01-23 DIAGNOSIS — J9602 Acute respiratory failure with hypercapnia: Secondary | ICD-10-CM | POA: Diagnosis present

## 2023-01-23 DIAGNOSIS — I10 Essential (primary) hypertension: Secondary | ICD-10-CM

## 2023-01-23 DIAGNOSIS — E876 Hypokalemia: Secondary | ICD-10-CM | POA: Diagnosis not present

## 2023-01-23 DIAGNOSIS — F32A Depression, unspecified: Secondary | ICD-10-CM | POA: Diagnosis present

## 2023-01-23 DIAGNOSIS — R0789 Other chest pain: Secondary | ICD-10-CM | POA: Diagnosis not present

## 2023-01-23 DIAGNOSIS — J9622 Acute and chronic respiratory failure with hypercapnia: Secondary | ICD-10-CM | POA: Diagnosis present

## 2023-01-23 DIAGNOSIS — G4733 Obstructive sleep apnea (adult) (pediatric): Secondary | ICD-10-CM

## 2023-01-23 DIAGNOSIS — Z6841 Body Mass Index (BMI) 40.0 and over, adult: Secondary | ICD-10-CM | POA: Diagnosis not present

## 2023-01-23 DIAGNOSIS — K029 Dental caries, unspecified: Secondary | ICD-10-CM | POA: Diagnosis not present

## 2023-01-23 DIAGNOSIS — I714 Abdominal aortic aneurysm, without rupture, unspecified: Secondary | ICD-10-CM | POA: Diagnosis present

## 2023-01-23 DIAGNOSIS — K5730 Diverticulosis of large intestine without perforation or abscess without bleeding: Secondary | ICD-10-CM | POA: Diagnosis not present

## 2023-01-23 DIAGNOSIS — K7689 Other specified diseases of liver: Secondary | ICD-10-CM | POA: Diagnosis not present

## 2023-01-23 DIAGNOSIS — I2510 Atherosclerotic heart disease of native coronary artery without angina pectoris: Secondary | ICD-10-CM | POA: Diagnosis not present

## 2023-01-23 DIAGNOSIS — G2581 Restless legs syndrome: Secondary | ICD-10-CM | POA: Diagnosis present

## 2023-01-23 DIAGNOSIS — K0850 Unsatisfactory restoration of tooth, unspecified: Secondary | ICD-10-CM | POA: Diagnosis not present

## 2023-01-23 DIAGNOSIS — R079 Chest pain, unspecified: Secondary | ICD-10-CM | POA: Diagnosis present

## 2023-01-23 DIAGNOSIS — I4891 Unspecified atrial fibrillation: Secondary | ICD-10-CM | POA: Diagnosis not present

## 2023-01-23 DIAGNOSIS — J432 Centrilobular emphysema: Secondary | ICD-10-CM | POA: Diagnosis not present

## 2023-01-23 DIAGNOSIS — I060 Rheumatic aortic stenosis: Secondary | ICD-10-CM | POA: Diagnosis present

## 2023-01-23 DIAGNOSIS — I4510 Unspecified right bundle-branch block: Secondary | ICD-10-CM | POA: Diagnosis not present

## 2023-01-23 DIAGNOSIS — Z952 Presence of prosthetic heart valve: Secondary | ICD-10-CM | POA: Diagnosis not present

## 2023-01-23 DIAGNOSIS — E7800 Pure hypercholesterolemia, unspecified: Secondary | ICD-10-CM | POA: Diagnosis present

## 2023-01-23 DIAGNOSIS — Z01818 Encounter for other preprocedural examination: Secondary | ICD-10-CM | POA: Diagnosis not present

## 2023-01-23 DIAGNOSIS — Z66 Do not resuscitate: Secondary | ICD-10-CM | POA: Diagnosis present

## 2023-01-23 DIAGNOSIS — J4489 Other specified chronic obstructive pulmonary disease: Secondary | ICD-10-CM | POA: Diagnosis present

## 2023-01-23 DIAGNOSIS — I350 Nonrheumatic aortic (valve) stenosis: Secondary | ICD-10-CM | POA: Diagnosis not present

## 2023-01-23 DIAGNOSIS — R519 Headache, unspecified: Secondary | ICD-10-CM | POA: Diagnosis not present

## 2023-01-23 DIAGNOSIS — I272 Pulmonary hypertension, unspecified: Secondary | ICD-10-CM | POA: Diagnosis present

## 2023-01-23 DIAGNOSIS — I452 Bifascicular block: Secondary | ICD-10-CM | POA: Diagnosis present

## 2023-01-23 LAB — CBC WITH DIFFERENTIAL/PLATELET
Abs Immature Granulocytes: 0.1 10*3/uL — ABNORMAL HIGH (ref 0.00–0.07)
Basophils Absolute: 0.1 10*3/uL (ref 0.0–0.1)
Basophils Relative: 1 %
Eosinophils Absolute: 0.3 10*3/uL (ref 0.0–0.5)
Eosinophils Relative: 3 %
HCT: 41.9 % (ref 39.0–52.0)
Hemoglobin: 13.5 g/dL (ref 13.0–17.0)
Immature Granulocytes: 1 %
Lymphocytes Relative: 18 %
Lymphs Abs: 2.1 10*3/uL (ref 0.7–4.0)
MCH: 29.4 pg (ref 26.0–34.0)
MCHC: 32.2 g/dL (ref 30.0–36.0)
MCV: 91.3 fL (ref 80.0–100.0)
Monocytes Absolute: 0.8 10*3/uL (ref 0.1–1.0)
Monocytes Relative: 7 %
Neutro Abs: 8.5 10*3/uL — ABNORMAL HIGH (ref 1.7–7.7)
Neutrophils Relative %: 70 %
Platelets: 260 10*3/uL (ref 150–400)
RBC: 4.59 MIL/uL (ref 4.22–5.81)
RDW: 15.7 % — ABNORMAL HIGH (ref 11.5–15.5)
WBC: 11.8 10*3/uL — ABNORMAL HIGH (ref 4.0–10.5)
nRBC: 0 % (ref 0.0–0.2)

## 2023-01-23 LAB — COMPREHENSIVE METABOLIC PANEL
ALT: 19 U/L (ref 0–44)
AST: 14 U/L — ABNORMAL LOW (ref 15–41)
Albumin: 3.2 g/dL — ABNORMAL LOW (ref 3.5–5.0)
Alkaline Phosphatase: 72 U/L (ref 38–126)
Anion gap: 10 (ref 5–15)
BUN: 28 mg/dL — ABNORMAL HIGH (ref 8–23)
CO2: 30 mmol/L (ref 22–32)
Calcium: 8.4 mg/dL — ABNORMAL LOW (ref 8.9–10.3)
Chloride: 99 mmol/L (ref 98–111)
Creatinine, Ser: 0.9 mg/dL (ref 0.61–1.24)
GFR, Estimated: >60 mL/min (ref >60)
Glucose, Bld: 92 mg/dL (ref 70–99)
Potassium: 3.3 mmol/L — ABNORMAL LOW (ref 3.5–5.1)
Sodium: 139 mmol/L (ref 135–145)
Total Bilirubin: 0.5 mg/dL (ref 0.3–1.2)
Total Protein: 6.5 g/dL (ref 6.5–8.1)

## 2023-01-23 LAB — RESPIRATORY PANEL BY PCR

## 2023-01-23 LAB — GLUCOSE, CAPILLARY
Glucose-Capillary: 150 mg/dL — ABNORMAL HIGH (ref 70–99)
Glucose-Capillary: 154 mg/dL — ABNORMAL HIGH (ref 70–99)
Glucose-Capillary: 135 mg/dL — ABNORMAL HIGH (ref 70–99)

## 2023-01-23 LAB — PHOSPHORUS: Phosphorus: 3.3 mg/dL (ref 2.5–4.6)

## 2023-01-23 LAB — MAGNESIUM: Magnesium: 2 mg/dL (ref 1.7–2.4)

## 2023-01-23 LAB — TROPONIN I (HIGH SENSITIVITY): Troponin I (High Sensitivity): 8 ng/L (ref <18)

## 2023-01-23 LAB — SARS CORONAVIRUS 2 BY RT PCR: SARS Coronavirus 2 by RT PCR: NEGATIVE

## 2023-01-23 LAB — CBG MONITORING, ED: Glucose-Capillary: 142 mg/dL — ABNORMAL HIGH (ref 70–99)

## 2023-01-23 MED ORDER — ACETAMINOPHEN 500 MG PO TABS
1000.0000 mg | ORAL_TABLET | Freq: Once | ORAL | Status: AC
  Administered 2023-01-23: 1000 mg via ORAL
  Filled 2023-01-23: qty 2

## 2023-01-23 MED ORDER — ACETAMINOPHEN 325 MG PO TABS
650.0000 mg | ORAL_TABLET | Freq: Four times a day (QID) | ORAL | Status: DC | PRN
  Administered 2023-01-23 – 2023-02-04 (×20): 650 mg via ORAL
  Filled 2023-01-23 (×23): qty 2

## 2023-01-23 MED ORDER — LACOSAMIDE 50 MG PO TABS
150.0000 mg | ORAL_TABLET | Freq: Two times a day (BID) | ORAL | Status: DC
  Administered 2023-01-23 – 2023-02-08 (×31): 150 mg via ORAL
  Filled 2023-01-23 (×31): qty 3

## 2023-01-23 MED ORDER — FUROSEMIDE 10 MG/ML IJ SOLN
80.0000 mg | Freq: Two times a day (BID) | INTRAMUSCULAR | Status: DC
  Administered 2023-01-24 – 2023-01-25 (×5): 80 mg via INTRAVENOUS
  Filled 2023-01-23 (×5): qty 8

## 2023-01-23 MED ORDER — PREGABALIN 25 MG PO CAPS
150.0000 mg | ORAL_CAPSULE | Freq: Three times a day (TID) | ORAL | Status: DC
  Administered 2023-01-23 – 2023-02-08 (×45): 150 mg via ORAL
  Filled 2023-01-23 (×45): qty 2

## 2023-01-23 MED ORDER — DILTIAZEM HCL ER COATED BEADS 120 MG PO CP24
120.0000 mg | ORAL_CAPSULE | Freq: Every day | ORAL | Status: DC
  Administered 2023-01-23 – 2023-02-05 (×12): 120 mg via ORAL
  Filled 2023-01-23 (×14): qty 1

## 2023-01-23 MED ORDER — LEVETIRACETAM 500 MG PO TABS
500.0000 mg | ORAL_TABLET | Freq: Two times a day (BID) | ORAL | Status: DC
  Administered 2023-01-23 – 2023-02-08 (×31): 500 mg via ORAL
  Filled 2023-01-23 (×31): qty 1

## 2023-01-23 MED ORDER — ALLOPURINOL 100 MG PO TABS
200.0000 mg | ORAL_TABLET | Freq: Two times a day (BID) | ORAL | Status: DC
  Administered 2023-01-23 – 2023-02-08 (×30): 200 mg via ORAL
  Filled 2023-01-23 (×30): qty 2

## 2023-01-23 MED ORDER — MIRTAZAPINE 15 MG PO TABS
15.0000 mg | ORAL_TABLET | Freq: Every day | ORAL | Status: DC
  Administered 2023-01-23 – 2023-02-07 (×16): 15 mg via ORAL
  Filled 2023-01-23 (×17): qty 1

## 2023-01-23 MED ORDER — INSULIN ASPART 100 UNIT/ML IJ SOLN
0.0000 [IU] | Freq: Three times a day (TID) | INTRAMUSCULAR | Status: DC
  Administered 2023-01-23: 3 [IU] via SUBCUTANEOUS
  Administered 2023-01-24 (×2): 2 [IU] via SUBCUTANEOUS
  Administered 2023-01-24: 3 [IU] via SUBCUTANEOUS
  Administered 2023-01-25: 2 [IU] via SUBCUTANEOUS
  Administered 2023-01-25: 3 [IU] via SUBCUTANEOUS
  Administered 2023-01-25 – 2023-01-27 (×3): 2 [IU] via SUBCUTANEOUS
  Administered 2023-01-27: 3 [IU] via SUBCUTANEOUS
  Administered 2023-01-27 – 2023-01-28 (×3): 2 [IU] via SUBCUTANEOUS
  Administered 2023-01-28: 3 [IU] via SUBCUTANEOUS
  Administered 2023-01-29: 2 [IU] via SUBCUTANEOUS
  Administered 2023-01-29: 3 [IU] via SUBCUTANEOUS
  Administered 2023-01-30: 2 [IU] via SUBCUTANEOUS
  Administered 2023-01-30: 3 [IU] via SUBCUTANEOUS
  Administered 2023-01-31: 2 [IU] via SUBCUTANEOUS
  Administered 2023-01-31: 3 [IU] via SUBCUTANEOUS
  Administered 2023-01-31: 2 [IU] via SUBCUTANEOUS
  Administered 2023-02-01: 3 [IU] via SUBCUTANEOUS
  Administered 2023-02-01: 2 [IU] via SUBCUTANEOUS
  Administered 2023-02-01: 3 [IU] via SUBCUTANEOUS
  Administered 2023-02-02: 5 [IU] via SUBCUTANEOUS
  Administered 2023-02-02: 3 [IU] via SUBCUTANEOUS
  Administered 2023-02-02: 2 [IU] via SUBCUTANEOUS
  Administered 2023-02-03 – 2023-02-04 (×5): 5 [IU] via SUBCUTANEOUS
  Administered 2023-02-05 (×3): 3 [IU] via SUBCUTANEOUS
  Administered 2023-02-06: 2 [IU] via SUBCUTANEOUS
  Administered 2023-02-06: 3 [IU] via SUBCUTANEOUS
  Administered 2023-02-06: 5 [IU] via SUBCUTANEOUS
  Administered 2023-02-08: 3 [IU] via SUBCUTANEOUS
  Administered 2023-02-08: 5 [IU] via SUBCUTANEOUS

## 2023-01-23 MED ORDER — ONDANSETRON HCL 4 MG/2ML IJ SOLN: 4.0000 mg | Freq: Four times a day (QID) | INTRAMUSCULAR | Status: DC | PRN

## 2023-01-23 MED ORDER — DAPAGLIFLOZIN PROPANEDIOL 10 MG PO TABS
10.0000 mg | ORAL_TABLET | Freq: Every day | ORAL | Status: DC
  Administered 2023-01-24 – 2023-02-02 (×10): 10 mg via ORAL
  Filled 2023-01-23 (×10): qty 1

## 2023-01-23 MED ORDER — TRAZODONE HCL 100 MG PO TABS
200.0000 mg | ORAL_TABLET | Freq: Every day | ORAL | Status: DC
  Administered 2023-01-23 – 2023-02-07 (×16): 200 mg via ORAL
  Filled 2023-01-23 (×16): qty 2

## 2023-01-23 MED ORDER — APIXABAN 5 MG PO TABS
5.0000 mg | ORAL_TABLET | Freq: Two times a day (BID) | ORAL | Status: DC
  Administered 2023-01-23 – 2023-01-24 (×3): 5 mg via ORAL
  Filled 2023-01-23 (×3): qty 1

## 2023-01-23 MED ORDER — VENLAFAXINE HCL ER 75 MG PO CP24
225.0000 mg | ORAL_CAPSULE | Freq: Every day | ORAL | Status: DC
  Administered 2023-01-23 – 2023-02-08 (×15): 225 mg via ORAL
  Filled 2023-01-23 (×17): qty 3

## 2023-01-23 MED ORDER — METOPROLOL SUCCINATE ER 25 MG PO TB24
25.0000 mg | ORAL_TABLET | Freq: Every day | ORAL | Status: DC
  Administered 2023-01-24 – 2023-01-25 (×2): 25 mg via ORAL
  Filled 2023-01-23 (×2): qty 1

## 2023-01-23 MED ORDER — BUSPIRONE HCL 5 MG PO TABS
10.0000 mg | ORAL_TABLET | Freq: Three times a day (TID) | ORAL | Status: DC
  Administered 2023-01-23 – 2023-02-08 (×45): 10 mg via ORAL
  Filled 2023-01-23 (×45): qty 2

## 2023-01-23 MED ORDER — OXYCODONE HCL 5 MG PO TABS
5.0000 mg | ORAL_TABLET | Freq: Four times a day (QID) | ORAL | Status: AC | PRN
  Administered 2023-01-23 – 2023-01-24 (×4): 5 mg via ORAL
  Filled 2023-01-23 (×5): qty 1

## 2023-01-23 MED ORDER — INSULIN GLARGINE-YFGN 100 UNIT/ML ~~LOC~~ SOLN
30.0000 [IU] | Freq: Every day | SUBCUTANEOUS | Status: DC
  Administered 2023-01-24 – 2023-01-25 (×2): 30 [IU] via SUBCUTANEOUS
  Filled 2023-01-23 (×3): qty 0.3

## 2023-01-23 MED ORDER — PRAZOSIN HCL 2 MG PO CAPS
2.0000 mg | ORAL_CAPSULE | Freq: Every day | ORAL | Status: DC
  Administered 2023-01-24 – 2023-02-07 (×16): 2 mg via ORAL
  Filled 2023-01-23 (×18): qty 1

## 2023-01-23 MED ORDER — ATORVASTATIN CALCIUM 40 MG PO TABS
40.0000 mg | ORAL_TABLET | Freq: Every day | ORAL | Status: DC
  Administered 2023-01-23 – 2023-02-08 (×15): 40 mg via ORAL
  Filled 2023-01-23 (×15): qty 1

## 2023-01-23 MED ORDER — MELATONIN 3 MG PO TABS
3.0000 mg | ORAL_TABLET | Freq: Every evening | ORAL | Status: DC | PRN
  Administered 2023-02-03: 3 mg via ORAL
  Filled 2023-01-23: qty 1

## 2023-01-23 MED ORDER — SPIRONOLACTONE 12.5 MG HALF TABLET
12.5000 mg | ORAL_TABLET | Freq: Every day | ORAL | Status: DC
  Administered 2023-01-24 – 2023-02-08 (×14): 12.5 mg via ORAL
  Filled 2023-01-23 (×14): qty 1

## 2023-01-23 MED ORDER — NOVOLOG FLEXPEN 100 UNIT/ML ~~LOC~~ SOPN: PEN_INJECTOR | SUBCUTANEOUS | 3 refills | Status: DC

## 2023-01-23 MED ORDER — FLUTICASONE FUROATE-VILANTEROL 200-25 MCG/ACT IN AEPB
1.0000 | INHALATION_SPRAY | Freq: Every day | RESPIRATORY_TRACT | Status: DC
  Administered 2023-01-24 – 2023-02-08 (×13): 1 via RESPIRATORY_TRACT
  Filled 2023-01-23 (×2): qty 28

## 2023-01-23 MED ORDER — FENOFIBRATE 160 MG PO TABS
160.0000 mg | ORAL_TABLET | Freq: Every day | ORAL | Status: DC
  Administered 2023-01-24 – 2023-02-08 (×14): 160 mg via ORAL
  Filled 2023-01-23 (×16): qty 1

## 2023-01-23 MED ORDER — IOHEXOL 350 MG/ML SOLN
75.0000 mL | Freq: Once | INTRAVENOUS | Status: AC | PRN
  Administered 2023-01-23: 75 mL via INTRAVENOUS

## 2023-01-23 MED ORDER — ALBUTEROL SULFATE (2.5 MG/3ML) 0.083% IN NEBU: 2.5000 mg | INHALATION_SOLUTION | RESPIRATORY_TRACT | Status: DC | PRN

## 2023-01-23 MED ORDER — ARIPIPRAZOLE 5 MG PO TABS
5.0000 mg | ORAL_TABLET | Freq: Every day | ORAL | Status: DC
  Administered 2023-01-23 – 2023-02-07 (×16): 5 mg via ORAL
  Filled 2023-01-23 (×17): qty 1

## 2023-01-23 MED ORDER — INSULIN ASPART 100 UNIT/ML IJ SOLN
0.0000 [IU] | Freq: Every day | INTRAMUSCULAR | Status: DC
  Administered 2023-01-25 – 2023-02-04 (×3): 2 [IU] via SUBCUTANEOUS
  Administered 2023-02-07: 3 [IU] via SUBCUTANEOUS

## 2023-01-23 MED ORDER — UMECLIDINIUM BROMIDE 62.5 MCG/ACT IN AEPB
1.0000 | INHALATION_SPRAY | Freq: Every day | RESPIRATORY_TRACT | Status: DC
  Administered 2023-01-24 – 2023-02-08 (×13): 1 via RESPIRATORY_TRACT
  Filled 2023-01-23 (×3): qty 7

## 2023-01-23 MED ORDER — TORSEMIDE 20 MG PO TABS: 100.0000 mg | ORAL_TABLET | Freq: Two times a day (BID) | ORAL | Status: DC

## 2023-01-23 MED ORDER — ALBUTEROL SULFATE (2.5 MG/3ML) 0.083% IN NEBU
2.5000 mg | INHALATION_SOLUTION | Freq: Four times a day (QID) | RESPIRATORY_TRACT | Status: DC
  Administered 2023-01-23: 2.5 mg via RESPIRATORY_TRACT
  Filled 2023-01-23: qty 3

## 2023-01-23 MED ORDER — ACETAMINOPHEN 650 MG RE SUPP: 650.0000 mg | Freq: Four times a day (QID) | RECTAL | Status: DC | PRN

## 2023-01-23 MED ORDER — SODIUM CHLORIDE 0.9% FLUSH
3.0000 mL | Freq: Two times a day (BID) | INTRAVENOUS | Status: DC
  Administered 2023-01-23 – 2023-02-02 (×20): 3 mL via INTRAVENOUS

## 2023-01-23 MED ORDER — METOLAZONE 5 MG PO TABS
2.5000 mg | ORAL_TABLET | ORAL | Status: DC
  Administered 2023-01-24: 2.5 mg via ORAL
  Filled 2023-01-23 (×4): qty 1

## 2023-01-23 NOTE — ED Provider Notes (Signed)
Results for orders placed or performed during the hospital encounter of 01/22/23  Basic metabolic panel  Result Value Ref Range   Sodium 139 135 - 145 mmol/L   Potassium 3.2 (L) 3.5 - 5.1 mmol/L   Chloride 99 98 - 111 mmol/L   CO2 30 22 - 32 mmol/L   Glucose, Bld 91 70 - 99 mg/dL   BUN 24 (H) 8 - 23 mg/dL   Creatinine, Ser 1.61 0.61 - 1.24 mg/dL   Calcium 8.6 (L) 8.9 - 10.3 mg/dL   GFR, Estimated >09 >60 mL/min   Anion gap 10 5 - 15  CBC  Result Value Ref Range   WBC 13.0 (H) 4.0 - 10.5 K/uL   RBC 4.80 4.22 - 5.81 MIL/uL   Hemoglobin 14.1 13.0 - 17.0 g/dL   HCT 45.4 09.8 - 11.9 %   MCV 92.1 80.0 - 100.0 fL   MCH 29.4 26.0 - 34.0 pg   MCHC 31.9 30.0 - 36.0 g/dL   RDW 14.7 (H) 82.9 - 56.2 %   Platelets 269 150 - 400 K/uL   nRBC 0.0 0.0 - 0.2 %  Brain natriuretic peptide  Result Value Ref Range   B Natriuretic Peptide 25.3 0.0 - 100.0 pg/mL  CBG monitoring, ED  Result Value Ref Range   Glucose-Capillary 142 (H) 70 - 99 mg/dL  Troponin I (High Sensitivity)  Result Value Ref Range   Troponin I (High Sensitivity) 8 <18 ng/L  Troponin I (High Sensitivity)  Result Value Ref Range   Troponin I (High Sensitivity) 8 <18 ng/L   CT Angio Chest PE W/Cm &/Or Wo Cm  Result Date: 01/23/2023 CLINICAL DATA:  Chest pain or shortness breath. EXAM: CT ANGIOGRAPHY CHEST WITH CONTRAST TECHNIQUE: Multidetector CT imaging of the chest was performed using the standard protocol during bolus administration of intravenous contrast. Multiplanar CT image reconstructions and MIPs were obtained to evaluate the vascular anatomy. RADIATION DOSE REDUCTION: This exam was performed according to the departmental dose-optimization program which includes automated exposure control, adjustment of the mA and/or kV according to patient size and/or use of iterative reconstruction technique. CONTRAST:  75mL OMNIPAQUE IOHEXOL 350 MG/ML SOLN COMPARISON:  AP and lateral chest yesterday, CTA chest 10/26/2022, CTA chest  09/08/2018. FINDINGS: Cardiovascular: There is mild-to-moderate cardiomegaly with a left chamber predominance. Pulmonary veins are normal in caliber. There are three-vessel coronary artery calcifications in the proximal LAD stenting again noted. There is lipomatous infiltration of the inter-atrial septum. The pulmonary trunk prominent but stable measuring 3 cm. There is IVC and hepatic vein reflux which may suggest tricuspid regurgitation or elevated right heart pressures but there is no enlargement of the right ventricle compared to the left. No arterial embolic filling defect is seen. There is a linear calcification within a segmental artery to the posterior basal left lower lobe consistent with a chronic linear calcified thrombus. There are moderate to heavy calcifications in the aortic valve leaflets, mild aortic tortuosity and mild aortic and great vessel atherosclerosis. There is no aortic aneurysm. There is no great vessel or aortic stenosis or dissection. There is a normal variant brachiobicarotid trunk. Mediastinum/Nodes: There are no thyroid nodules or axillary adenopathy. Single mildly prominent azygoesophageal recess node measures 1.4 cm and is unchanged. There are scattered bilateral slightly prominent hilar lymph nodes, largest on the right is 1.1 cm in the posterior mid hilum, largest on the left is 1 cm short axis in the lower hilum. There is no new, further or enlarging adenopathy. There  is lipomatosis of the superior mediastinum exaggerating its diameter. Small hiatal hernia without esophageal thickening. The trachea and central airways are clear. Lungs/Pleura: There is no pleural effusion or pneumothorax. Mild paraseptal emphysematous changes are noted in the lung apices. There is diffuse bronchial thickening, greater in the lower lobes, with scattered bilateral lower lobe subsegmental bronchial impactions. There is posterior atelectasis without evidence of bronchopneumonia. There is mosaic  attenuation in the upper lobes most likely due to small airways disease with air trapping. No focal pneumonia is evident.  No pulmonary nodules. Upper Abdomen: No acute abnormality. There is a cyst in hepatic segment 5 measuring 3.9 cm and 1.2 Hounsfield units. The liver is mildly steatotic. This was seen previously. Musculoskeletal: There is extensive multilevel thoracic spine bridging enthesopathy. No acute osseous findings or aggressive lesions. An elongate lipoma is again noted in the right infraspinatus muscle. Review of the MIP images confirms the above findings. IMPRESSION: 1. No evidence of acute arterial embolic filling defect. 2. Chronic linear calcified thrombus in a segmental artery to the posterior basal left lower lobe. 3. Cardiomegaly with lipomatous infiltration of the inter-atrial septum. 4. Aortic and coronary artery atherosclerosis. 5. Prominent pulmonary trunk but no right ventricular enlargement. 6. IVC and hepatic vein reflux which may suggest tricuspid regurgitation or elevated right heart pressures. 7. COPD with bronchial thickening and scattered subsegmental bronchial impactions in the lower lobes. 8. Mosaic attenuation in the upper lobes most likely due to small airways disease with air trapping. No appreciable focal pneumonia. 9. Stable mildly prominent lymph nodes. 10. Small hiatal hernia. 11. Emphysema. Aortic Atherosclerosis (ICD10-I70.0) and Emphysema (ICD10-J43.9). Electronically Signed   By: Almira Bar M.D.   On: 01/23/2023 04:50   DG Chest 2 View  Result Date: 01/22/2023 CLINICAL DATA:  Pain and shortness of breath EXAM: CHEST - 2 VIEW COMPARISON:  10/26/2022 FINDINGS: Cardiac shadow is enlarged. The overall inspiratory effort is within normal limits. No focal infiltrate is seen. No effusion is noted. IMPRESSION: No acute abnormality noted. Electronically Signed   By: Alcide Clever M.D.   On: 01/22/2023 21:33    CTA without acute PE, does appear to have chronic thrombus  basal LLL.  COPD with bronchial impactions in lower lobes, no focal pneumonia.  On re-check patient still requiring supplemental O2, currently on 4L and sats still marginal around 92% even while sitting perfectly upright, perfect pleth on monitoring. He does not have any active wheezing.  He does admit symptoms dramatically worse over the past 3 days, only able to walk about 10 steps or so then has to sit down which is abnormal for him.  Does not require home O2, never has in the past.  At this point, unclear etiology of his ongoing hypoxia.  He will require admission for further management.  Discussed with Dr. Arlean Hopping-- will admit for ongoing care.   Garlon Hatchet, PA-C 01/23/23 Cathe Mons, MD 01/23/23 [phone]

## 2023-01-23 NOTE — Progress Notes (Signed)
  Carryover admission to the Day Admitter.  I discussed this case with the EDP, Sharilyn Sites, PA.  Per these discussions:   This is a 67 year old male with history of COPD, paroxysmal atrial fibrillation chronically anticoagulated on Eliquis, who is being admitted with acute hypoxic respiratory failure after presenting with 2 weeks of progressive shortness of breath, significantly worse over the last 72 hours, worse with exertion.  Denies any known baseline supplemental oxygen requirements.  Reports good compliance with his outpatient Eliquis in the setting of paroxysmal atrial fibrillation.  He has noted an associated nonproductive cough in the absence of any subjective fever or chills.  No chest pain.  Noted to be hypoxic in the ED, requiring 4 L nasal cannula to maintain oxygen saturations in the low to mid 90s..  Afebrile.  CTA chest showed no evidence of acute cardiopulmonary process.  It noted a chronic appearing pulmonary embolism, unchanged from most recent prior imaging.  Suspicion for contribution from acute COPD exacerbation.  I have placed an order for inpatient admission for further evaluation management of the above.  I have placed some additional preliminary admit orders via the adult multi-morbid admission order set. I have also ordered morning labs in the form of CMP, CBC, serum magnesium level as well as serum phosphorus level.  I will defer orders for potential steroids and nebulizer treatments to the admitting hospitalist.    Lawrence Pigg, DO Hospitalist

## 2023-01-23 NOTE — ED Notes (Signed)
ED TO INPATIENT HANDOFF REPORT  ED Nurse Name and Phone #: Paiton Boulting[address] [phone]  S Name/Age/Gender Lawrence [name] 67 y.o. male Room/Bed: 028C/028C  Code Status   Code Status: DNR  Home/SNF/Other Home Patient oriented to: self, place, time, and situation Is this baseline? Yes   Triage Complete: Triage complete  Chief Complaint Acute hypoxic respiratory failure (HCC) [J96.01]  Triage Note Pt arrived from home via [facility] c/o SOB and chest pain radiating to right neck and right shoulder. Pt states that this began approx 2 weeks ago and is gradually getting worse.    Allergies Allergies  Allergen Reactions   Vancomycin Other (See Comments)    "Red Man Syndrome" 02/02/17: possible cause for rash under both arms   Niacin And Related Other (See Comments)    Red man syndrome   Tubersol [Tuberculin, Ppd] Other (See Comments)    Reaction unknown   Doxycycline Rash and Other (See Comments)    Level of Care/Admitting Diagnosis ED Disposition     ED Disposition  Admit   Condition  --   Comment  Hospital Area: [facility] [[id number]]  Level of Care: Progressive [102]  Admit to Progressive based on following criteria: MULTISYSTEM THREATS such as stable sepsis, metabolic/electrolyte imbalance with or without encephalopathy that is responding to early treatment.  May admit patient to [facility] or [facility] if equivalent level of care is available:: No  Covid Evaluation: Asymptomatic - no recent exposure (last 10 days) testing not required  Diagnosis: Acute hypoxic respiratory failure [facility]) [[id number]]  Admitting Physician: Angie Fava [[id number]]  Attending Physician: Angie Fava [[id number]]  Certification:: I certify this patient will need inpatient services for at least 2 midnights  Estimated Length of Stay: 2          B Medical/Surgery History Past Medical History:  Diagnosis Date   Acquired dilation of ascending aorta and aortic root (HCC)    40mm  by echo 01/2021   Adenomatous colon polyp 2007   Anemia    Anxiety    Aortic stenosis    moderate AS by echo 01/2021   Asthma    BPH without obstruction/lower urinary tract symptoms 02/22/2017   Chronic diastolic (congestive) heart failure (HCC)    Chronic venous stasis 03/07/2019   COPD (chronic obstructive pulmonary disease) (HCC)    Coronary artery calcification seen on CAT scan    Depression    Diabetic neuropathy (HCC) 09/11/2019   History of colon polyps 08/24/2018   Hypertension    Morbid obesity (HCC)    OSA (obstructive sleep apnea)    Pain due to onychomycosis of toenails of both feet 09/11/2019   Peripheral neuropathy 02/22/2017   Primary osteoarthritis, left shoulder 03/05/2017   PTSD (post-traumatic stress disorder)    Pure hypercholesterolemia    QT prolongation 03/07/2019   Seizures (HCC)    Shortness of breath    Sinus tachycardia 03/07/2019   Sleep apnea    CPAP   Type 2 diabetes mellitus with vascular disease (HCC) 09/11/2019   Past Surgical History:  Procedure Laterality Date   ENDOVENOUS ABLATION SAPHENOUS VEIN W/ LASER Right 08/20/2020   endovenous laser ablation right greater saphenous vein by Cari Caraway MD    ENDOVENOUS ABLATION SAPHENOUS VEIN W/ LASER Left 11/16/2022   endovenous laser ablation left greater saphenous vein by Cari Caraway MD   JOINT REPLACEMENT     left knee replacement x 2   KNEE ARTHROSCOPY Bilateral    LEFT HEART  CATH AND CORONARY ANGIOGRAPHY N/A 01/17/2018   Procedure: LEFT HEART CATH AND CORONARY ANGIOGRAPHY;  Surgeon: Marykay Lex, MD;  Location: [facility] INVASIVE CV LAB;  Service: Cardiovascular;  Laterality: N/A;   PRESSURE SENSOR/CARDIOMEMS N/A 08/26/2021   Procedure: PRESSURE SENSOR/CARDIOMEMS;  Surgeon: Laurey Morale, MD;  Location: [facility] INVASIVE CV LAB;  Service: Cardiovascular;  Laterality: N/A;   RIGHT HEART CATH N/A 08/26/2021   Procedure: RIGHT HEART CATH;  Surgeon: Laurey Morale, MD;  Location: [facility] INVASIVE CV  LAB;  Service: Cardiovascular;  Laterality: N/A;   TEE WITHOUT CARDIOVERSION N/A 08/26/2021   Procedure: TRANSESOPHAGEAL ECHOCARDIOGRAM (TEE);  Surgeon: Laurey Morale, MD;  Location: [facility] ENDOSCOPY;  Service: Cardiovascular;  Laterality: N/A;   UMBILICAL HERNIA REPAIR       A IV Location/Drains/Wounds Patient Lines/Drains/Airways Status     Active Line/Drains/Airways     Name Placement date Placement time Site Days   Peripheral IV 01/22/23 20 G Anterior;Proximal;Right Forearm 01/22/23  2200  Forearm  1   Wound / Incision (Open or Dehisced) 10/26/22 Venous stasis ulcer Leg Left 10/26/22  2314  Leg  89            Intake/Output Last 24 hours No intake or output data in the 24 hours ending 01/23/23 0538  Labs/Imaging Results for orders placed or performed during the hospital encounter of 01/22/23 (from the past 48 hour(s))  Basic metabolic panel     Status: Abnormal   Collection Time: 01/22/23  8:54 PM  Result Value Ref Range   Sodium 139 135 - 145 mmol/L   Potassium 3.2 (L) 3.5 - 5.1 mmol/L   Chloride 99 98 - 111 mmol/L   CO2 30 22 - 32 mmol/L   Glucose, Bld 91 70 - 99 mg/dL    Comment: Glucose reference range applies only to samples taken after fasting for at least 8 hours.   BUN 24 (H) 8 - 23 mg/dL   Creatinine, Ser 1.61 0.61 - 1.24 mg/dL   Calcium 8.6 (L) 8.9 - 10.3 mg/dL   GFR, Estimated >09 >60 mL/min    Comment: (NOTE) Calculated using the CKD-EPI Creatinine Equation (2021)    Anion gap 10 5 - 15    Comment: Performed at [facility], [address], [place], [place] [address]  CBC     Status: Abnormal   Collection Time: 01/22/23  8:54 PM  Result Value Ref Range   WBC 13.0 (H) 4.0 - 10.5 K/uL   RBC 4.80 4.22 - 5.81 MIL/uL   Hemoglobin 14.1 13.0 - 17.0 g/dL   HCT 81.1 91.4 - 78.2 %   MCV 92.1 80.0 - 100.0 fL   MCH 29.4 26.0 - 34.0 pg   MCHC 31.9 30.0 - 36.0 g/dL   RDW 95.6 (H) 21.3 - 08.6 %   Platelets 269 150 - 400 K/uL   nRBC 0.0 0.0 - 0.2 %     Comment: Performed at [facility], [address], [place], [place] [address]  Troponin I (High Sensitivity)     Status: None   Collection Time: 01/22/23  8:54 PM  Result Value Ref Range   Troponin I (High Sensitivity) 8 <18 ng/L    Comment: (NOTE) Elevated high sensitivity troponin I (hsTnI) values and significant  changes across serial measurements may suggest ACS but many other  chronic and acute conditions are known to elevate hsTnI results.  Refer to the "Links" section for chest pain algorithms and additional  guidance.  Performed at [facility], [address] [place], [place] [address]   Brain natriuretic peptide     Status: None   Collection Time: 01/22/23  8:54 PM  Result Value Ref Range   B Natriuretic Peptide 25.3 0.0 - 100.0 pg/mL    Comment: Performed at [facility], [address] [place], [place] [address]  Troponin I (High Sensitivity)     Status: None   Collection Time: 01/22/23 11:48 PM  Result Value Ref Range   Troponin I (High Sensitivity) 8 <18 ng/L    Comment: (NOTE) Elevated high sensitivity troponin I (hsTnI) values and significant  changes across serial measurements may suggest ACS but many other  chronic and acute conditions are known to elevate hsTnI results.  Refer to the "Links" section for chest pain algorithms and additional  guidance. Performed at [facility], [address] [place], [place] [address]   CBG monitoring, ED     Status: Abnormal   Collection Time: 01/23/23  3:15 AM  Result Value Ref Range   Glucose-Capillary 142 (H) 70 - 99 mg/dL    Comment: Glucose reference range applies only to samples taken after fasting for at least 8 hours.   CT Angio Chest PE W/Cm &/Or Wo Cm  Result Date: 01/23/2023 CLINICAL DATA:  Chest pain or shortness breath. EXAM: CT ANGIOGRAPHY CHEST WITH CONTRAST TECHNIQUE: Multidetector CT imaging of the chest was performed using the standard protocol during bolus administration of  intravenous contrast. Multiplanar CT image reconstructions and MIPs were obtained to evaluate the vascular anatomy. RADIATION DOSE REDUCTION: This exam was performed according to the departmental dose-optimization program which includes automated exposure control, adjustment of the mA and/or kV according to patient size and/or use of iterative reconstruction technique. CONTRAST:  75mL OMNIPAQUE IOHEXOL 350 MG/ML SOLN COMPARISON:  AP and lateral chest yesterday, CTA chest 10/26/2022, CTA chest 09/08/2018. FINDINGS: Cardiovascular: There is mild-to-moderate cardiomegaly with a left chamber predominance. Pulmonary veins are normal in caliber. There are three-vessel coronary artery calcifications in the proximal LAD stenting again noted. There is lipomatous infiltration of the inter-atrial septum. The pulmonary trunk prominent but stable measuring 3 cm. There is IVC and hepatic vein reflux which may suggest tricuspid regurgitation or elevated right heart pressures but there is no enlargement of the right ventricle compared to the left. No arterial embolic filling defect is seen. There is a linear calcification within a segmental artery to the posterior basal left lower lobe consistent with a chronic linear calcified thrombus. There are moderate to heavy calcifications in the aortic valve leaflets, mild aortic tortuosity and mild aortic and great vessel atherosclerosis. There is no aortic aneurysm. There is no great vessel or aortic stenosis or dissection. There is a normal variant brachiobicarotid trunk. Mediastinum/Nodes: There are no thyroid nodules or axillary adenopathy. Single mildly prominent azygoesophageal recess node measures 1.4 cm and is unchanged. There are scattered bilateral slightly prominent hilar lymph nodes, largest on the right is 1.1 cm in the posterior mid hilum, largest on the left is 1 cm short axis in the lower hilum. There is no new, further or enlarging adenopathy. There is lipomatosis of the  superior mediastinum exaggerating its diameter. Small hiatal hernia without esophageal thickening. The trachea and central airways are clear. Lungs/Pleura: There is no pleural effusion or pneumothorax. Mild paraseptal emphysematous changes are noted in the lung apices. There is diffuse bronchial thickening, greater in the lower lobes, with scattered bilateral lower lobe subsegmental bronchial impactions.  There is posterior atelectasis without evidence of bronchopneumonia. There is mosaic attenuation in the upper lobes most likely due to small airways disease with air trapping. No focal pneumonia is evident.  No pulmonary nodules. Upper Abdomen: No acute abnormality. There is a cyst in hepatic segment 5 measuring 3.9 cm and 1.2 Hounsfield units. The liver is mildly steatotic. This was seen previously. Musculoskeletal: There is extensive multilevel thoracic spine bridging enthesopathy. No acute osseous findings or aggressive lesions. An elongate lipoma is again noted in the right infraspinatus muscle. Review of the MIP images confirms the above findings. IMPRESSION: 1. No evidence of acute arterial embolic filling defect. 2. Chronic linear calcified thrombus in a segmental artery to the posterior basal left lower lobe. 3. Cardiomegaly with lipomatous infiltration of the inter-atrial septum. 4. Aortic and coronary artery atherosclerosis. 5. Prominent pulmonary trunk but no right ventricular enlargement. 6. IVC and hepatic vein reflux which may suggest tricuspid regurgitation or elevated right heart pressures. 7. COPD with bronchial thickening and scattered subsegmental bronchial impactions in the lower lobes. 8. Mosaic attenuation in the upper lobes most likely due to small airways disease with air trapping. No appreciable focal pneumonia. 9. Stable mildly prominent lymph nodes. 10. Small hiatal hernia. 11. Emphysema. Aortic Atherosclerosis (ICD10-I70.0) and Emphysema (ICD10-J43.9). Electronically Signed   By: Almira Bar M.D.   On: 01/23/2023 04:50   DG Chest 2 View  Result Date: 01/22/2023 CLINICAL DATA:  Pain and shortness of breath EXAM: CHEST - 2 VIEW COMPARISON:  10/26/2022 FINDINGS: Cardiac shadow is enlarged. The overall inspiratory effort is within normal limits. No focal infiltrate is seen. No effusion is noted. IMPRESSION: No acute abnormality noted. Electronically Signed   By: Alcide Clever M.D.   On: 01/22/2023 21:33    Pending Labs Unresulted Labs (From admission, onward)     Start     Ordered   01/23/23 0528  CBC with Differential/Platelet  Once,   R        01/23/23 0527   01/23/23 0528  Comprehensive metabolic panel  Once,   R        01/23/23 0527   01/23/23 0528  Magnesium  Once,   R        01/23/23 0527   01/23/23 0528  Phosphorus  Once,   R        01/23/23 0527            Vitals/Pain Today's Vitals   01/23/23 0105 01/23/23 0137 01/23/23 0303 01/23/23 0423  BP:  98/61 (!) 91/47 121/72  Pulse:  95 95 93  Resp:  17 17 (!) 22  Temp: 98 F (36.7 C)     TempSrc: Axillary     SpO2:  94% 92% (!) 89%  Weight:      Height:      PainSc:        Isolation Precautions No active isolations  Medications Medications  acetaminophen (TYLENOL) tablet 650 mg (has no administration in time range)    Or  acetaminophen (TYLENOL) suppository 650 mg (has no administration in time range)  melatonin tablet 3 mg (has no administration in time range)  ondansetron (ZOFRAN) injection 4 mg (has no administration in time range)  potassium chloride SA (KLOR-CON M) CR tablet 40 mEq (40 mEq Oral Given 01/22/23 2345)  acetaminophen (TYLENOL) tablet 1,000 mg (1,000 mg Oral Given 01/23/23 0103)  iohexol (OMNIPAQUE) 350 MG/ML injection 75 mL (75 mLs Intravenous Contrast Given 01/23/23 0418)    Mobility walks with  device     Focused Assessments Pulmonary Assessment Handoff:  Lung sounds: Bilateral Breath Sounds: Diminished L Breath Sounds: Diminished R Breath Sounds: Diminished O2 Device:  Nasal Cannula O2 Flow Rate (L/min): 4 L/min    R Recommendations: See Admitting Provider Note  Report given to:   Additional Notes: N/A

## 2023-01-23 NOTE — H&P (Signed)
History and Physical    Patient: Lawrence [name] MVH:[id number] DOB: [birth year] DOA: 01/22/2023 DOS: the patient was seen and examined on 01/23/2023 PCP: [name], PA-C  Patient coming from: Home  Chief Complaint:  Chief Complaint  Patient presents with   Chest Pain   Shortness of Breath   HPI: Lawrence [name] is a 67 y.o. male with medical history significant of hypertension, CHF, paroxysmal atrial fibrillation on Eliquis, aortic stenosis, CAD, CVA, diabetes mellitus type 2, COPD, AAA, prior history of tobacco abuse, morbid obesity, and OSA on CPAP who presented with complaints of shortness of breath.  Patient also reported that he had been intermittently having left-sided chest pain for the last 2 weeks.  Pain is sometimes sharp and other times dull. Reported it radiate into his neck and shoulder.  Patient had just recently had an echocardiogram done on 5/16 which noted EF to be 60 -65% with normal diastolic parameters.  Plan was for patient to have a right and left heart cath on the 25th of this month.  Denies having any recent sick contacts, fevers, chills, nausea, vomiting,  or diarrhea.  He had also may notice some mild redness of his lower extremities.  He also makes note that he is not quite sure when his last seizure occurred.  In the emergency department patient was noted to be afebrile with pulse 89-111, respirations 16-31, blood pressures 91/47-130/112, O2 saturations noted to be as low as 89% with improvement currently on 4 L of nasal cannula oxygen.  Labs significant for WBC 13 >-11.8, potassium 3.3, BUN 24-> 28, creatinine 1->0.9, calcium 8.6->.8.4, BNP 25.3, high-sensitivity troponin negative x 2.  Chest x-ray showed no acute abnormality.  CT angiogram of the chest have been obtained which showed no evidence of a acute arterial embolic filling defect, but did note chronic linear calcified thrombus in the segmental artery of the basal left lower lobe.  Review of Systems: As  mentioned in the history of present illness. All other systems reviewed and are negative. Past Medical History:  Diagnosis Date   Acquired dilation of ascending aorta and aortic root (HCC)    40mm by echo 01/2021   Adenomatous colon polyp 2007   Anemia    Anxiety    Aortic stenosis    moderate AS by echo 01/2021   Asthma    BPH without obstruction/lower urinary tract symptoms 02/22/2017   Chronic diastolic (congestive) heart failure (HCC)    Chronic venous stasis 03/07/2019   COPD (chronic obstructive pulmonary disease) (HCC)    Coronary artery calcification seen on CAT scan    Depression    Diabetic neuropathy (HCC) 09/11/2019   History of colon polyps 08/24/2018   Hypertension    Morbid obesity (HCC)    OSA (obstructive sleep apnea)    Pain due to onychomycosis of toenails of both feet 09/11/2019   Peripheral neuropathy 02/22/2017   Primary osteoarthritis, left shoulder 03/05/2017   PTSD (post-traumatic stress disorder)    Pure hypercholesterolemia    QT prolongation 03/07/2019   Seizures (HCC)    Shortness of breath    Sinus tachycardia 03/07/2019   Sleep apnea    CPAP   Type 2 diabetes mellitus with vascular disease (HCC) 09/11/2019   Past Surgical History:  Procedure Laterality Date   ENDOVENOUS ABLATION SAPHENOUS VEIN W/ LASER Right 08/20/2020   endovenous laser ablation right greater saphenous vein by Cari Caraway MD    ENDOVENOUS ABLATION SAPHENOUS VEIN W/ LASER Left 11/16/2022  endovenous laser ablation left greater saphenous vein by Cari Caraway MD   JOINT REPLACEMENT     left knee replacement x 2   KNEE ARTHROSCOPY Bilateral    LEFT HEART CATH AND CORONARY ANGIOGRAPHY N/A 01/17/2018   Procedure: LEFT HEART CATH AND CORONARY ANGIOGRAPHY;  Surgeon: Marykay Lex, MD;  Location: [facility] INVASIVE CV LAB;  Service: Cardiovascular;  Laterality: N/A;   PRESSURE SENSOR/CARDIOMEMS N/A 08/26/2021   Procedure: PRESSURE SENSOR/CARDIOMEMS;  Surgeon: Laurey Morale, MD;   Location: [facility] INVASIVE CV LAB;  Service: Cardiovascular;  Laterality: N/A;   RIGHT HEART CATH N/A 08/26/2021   Procedure: RIGHT HEART CATH;  Surgeon: Laurey Morale, MD;  Location: [facility] INVASIVE CV LAB;  Service: Cardiovascular;  Laterality: N/A;   TEE WITHOUT CARDIOVERSION N/A 08/26/2021   Procedure: TRANSESOPHAGEAL ECHOCARDIOGRAM (TEE);  Surgeon: Laurey Morale, MD;  Location: [facility] ENDOSCOPY;  Service: Cardiovascular;  Laterality: N/A;   UMBILICAL HERNIA REPAIR     Social History:  reports that he quit smoking about 6 years ago. His smoking use included cigarettes. He has a 44.00 pack-year smoking history. He has been exposed to tobacco smoke. He has never used smokeless tobacco. He reports current alcohol use. He reports that he does not use drugs.  Allergies  Allergen Reactions   Vancomycin Other (See Comments)    "Red Man Syndrome" 02/02/17: possible cause for rash under both arms   Niacin And Related Other (See Comments)    Red man syndrome   Tubersol [Tuberculin, Ppd] Other (See Comments)    Reaction unknown   Doxycycline Rash and Other (See Comments)    Family History  Problem Relation Age of Onset   CAD Maternal Grandfather    Diabetes Other    Diabetes Mellitus II Neg Hx    Colon cancer Neg Hx    Esophageal cancer Neg Hx    Inflammatory bowel disease Neg Hx    Liver disease Neg Hx    Pancreatic cancer Neg Hx    Rectal cancer Neg Hx    Stomach cancer Neg Hx    Sleep apnea Neg Hx     Prior to Admission medications   Medication Sig Start Date End Date Taking? Authorizing Provider  cephALEXin (KEFLEX) 500 MG capsule Take 1 capsule (500 mg total) by mouth 4 (four) times daily. 01/22/23  Yes Schuman, Beverly Gust, PA-C  acetaminophen (TYLENOL) 650 MG CR tablet Take 1,300 mg by mouth every 8 (eight) hours as needed for pain.    [provider]  albuterol (VENTOLIN HFA) 108 (90 Base) MCG/ACT inhaler Inhale 2 puffs into the lungs every 6 (six) hours as needed for wheezing or  shortness of breath. 08/18/22   Hunsucker, Lesia Sago, MD  allopurinol (ZYLOPRIM) 100 MG tablet Take 200 mg by mouth 2 (two) times daily.    [provider]  ARIPiprazole (ABILIFY) 5 MG tablet TAKE ONE TABLET BY MOUTH EVERYDAY AT BEDTIME 03/09/22   Mozingo, Thereasa Solo, NP  atorvastatin (LIPITOR) 40 MG tablet Take 1 tablet (40 mg total) by mouth daily. 10/12/22   Sharlene Dory, PA-C  busPIRone (BUSPAR) 10 MG tablet Take 1 tablet (10 mg total) by mouth 3 (three) times daily. 03/09/22   Mozingo, Thereasa Solo, NP  cetirizine (ZYRTEC) 10 MG tablet Take 10 mg by mouth at bedtime.     [provider]  Cholecalciferol (VITAMIN D3 PO) Take 2,000 Units by mouth daily.    [provider]  dapagliflozin propanediol (FARXIGA) 10 MG TABS  tablet TAKE ONE TABLET BY MOUTH BEFORE BREAKFAST 08/05/22   Reather Littler, MD  diclofenac Sodium (VOLTAREN) 1 % GEL Apply 4 g topically 4 (four) times daily. To right knee. 10/31/22   Arrien, York Ram, MD  diltiazem (CARDIZEM CD) 120 MG 24 hr capsule TAKE ONE CAPSULE BY MOUTH ONCE DAILY 12/27/22   Laurey Morale, MD  ELIQUIS 5 MG TABS tablet TAKE ONE TABLET BY MOUTH TWICE DAILY 08/05/22   Laurey Morale, MD  fenofibrate (TRICOR) 145 MG tablet Take 1 tablet (145 mg total) by mouth daily. 10/12/22   Sharlene Dory, PA-C  ferrous sulfate 324 (65 Fe) MG TBEC Take 324 mg by mouth daily with breakfast.     [provider]  fluticasone (FLONASE) 50 MCG/ACT nasal spray Place 2 sprays into both nostrils daily.     [provider]  Fluticasone-Umeclidin-Vilant (TRELEGY ELLIPTA) 200-62.5-25 MCG/ACT AEPB INHALE 1 PUFF BY MOUTH INTO LUNGS ONCE DAILY 08/18/22   Hunsucker, Lesia Sago, MD  insulin aspart (NOVOLOG FLEXPEN) 100 UNIT/ML FlexPen INJECT 10 UNITS BEFORE MEALS, keep sugar TWO HOURS AFTER meals UNDER 180 AT least 12/27/22   Reather Littler, MD  Insulin Pen Needle (B-D ULTRAFINE III SHORT PEN) 31G X 8 MM MISC Inject 1 each into the skin 3  (three) times daily before meals. Use as instructed 4X daily. 12/06/22   Reather Littler, MD  Lacosamide 150 MG TABS Take 1 tablet (150 mg total) by mouth 2 (two) times daily. 10/11/22   Windell Norfolk, MD  levETIRAcetam (KEPPRA) 500 MG tablet Take 1 tablet (500 mg total) by mouth 2 (two) times daily. 10/11/22   Windell Norfolk, MD  losartan (COZAAR) 25 MG tablet TAKE ONE TABLET BY MOUTH ONCE DAILY 12/28/22   Laurey Morale, MD  metFORMIN (GLUCOPHAGE-XR) 500 MG 24 hr tablet Take 4 tablets (2,000 mg total) by mouth daily. 06/16/21   Romero Belling, MD  metolazone (ZAROXOLYN) 2.5 MG tablet Take 1 tablet (2.5 mg total) by mouth once a week. Every Tuesday 12/05/22 03/05/23  Laurey Morale, MD  metoprolol succinate (TOPROL-XL) 25 MG 24 hr tablet Take 1 tablet (25 mg total) by mouth daily. 09/08/22   Laurey Morale, MD  mirtazapine (REMERON) 15 MG tablet TAKE ONE TABLET BY MOUTH EVERYDAY AT BEDTIME 12/28/22   Mozingo, Thereasa Solo, NP  omeprazole (PRILOSEC) 20 MG capsule Take 20 mg by mouth every morning. 05/29/20   [provider]  potassium chloride SA (KLOR-CON M) 20 MEQ tablet Take 2 tablets (40 mEq total) by mouth 2 (two) times daily. Except Tuesdays when you take ( 3 Tabs) 12/05/22   Laurey Morale, MD  prazosin (MINIPRESS) 2 MG capsule Take two tablets at bedtime. 03/09/22   Mozingo, Thereasa Solo, NP  pregabalin (LYRICA) 150 MG capsule Take 1 capsule (150 mg total) by mouth 3 (three) times daily. 10/11/22   Windell Norfolk, MD  repaglinide (PRANDIN) 2 MG tablet TAKE TWO TABLETS BY MOUTH TWICE DAILY BEFORE A meal 10/04/22   Reather Littler, MD  spironolactone (ALDACTONE) 25 MG tablet Take 0.5 tablets (12.5 mg total) by mouth daily. 11/16/22   Quintella Reichert, MD  tirzepatide [facility]) 12.5 MG/0.5ML Pen Inject 12.5 mg into the skin once a week. 01/19/23   [employer], MD  torsemide (DEMADEX) 20 MG tablet Take 5 tablets (100 mg total) by mouth 2 (two) times daily. 12/29/22   Clegg, Amy D, NP   traZODone (DESYREL) 100 MG tablet Take 200 mg by mouth  at bedtime. 10/18/22   [provider]  TRESIBA FLEXTOUCH 100 UNIT/ML FlexTouch Pen INJECT 40 UNITS into THE SKIN ONCE DAILY 10/14/22   Reather Littler, MD  VASCEPA 1 g capsule TAKE TWO CAPSULES BY MOUTH TWICE DAILY 08/05/22   Laurey Morale, MD  venlafaxine XR (EFFEXOR-XR) 75 MG 24 hr capsule Take 3 capsules (225 mg total) by mouth daily with breakfast. 03/09/22   Mozingo, Thereasa Solo, NP    Physical Exam: Vitals:   01/23/23 0545 01/23/23 0551 01/23/23 0600 01/23/23 0749  BP: 104/69  109/72 (!) 103/59  Pulse: 94  93 (!) 111  Resp: 17   18  Temp:  98.3 F (36.8 C)  98.3 F (36.8 C)  TempSrc:  Oral  Oral  SpO2: 91%  92% 92%  Weight:      Height:        Constitutional: Morbidly obese male currently in no acute distress Eyes: PERRL, lids and conjunctivae normal ENMT: Mucous membranes are moist. Posterior pharynx clear of any exudate or lesions Neck: normal, supple Respiratory: Decreased overall aeration.  No significant wheezes appreciated at this time. Cardiovascular: Regular rate and rhythm, no murmurs / rubs / gallops.  2+ pitting bilateral lower extremity edema  Abdomen: Protuberant abdomen without tenderness to palpation appreciated.  Bowel sounds positive.  Musculoskeletal: no clubbing / cyanosis. No joint deformity upper and lower extremities. Good ROM, no contractures. .  Skin: Mild erythema noted of the lower extremities with significant warmth appreciated Neurologic: CN 2-12 grossly intact. Strength 5/5 in all 4.  Psychiatric: Normal judgment and insight. Alert and oriented x 3. Normal mood.   Data Reviewed:  EKG revealed a sinus rhythm at 91 bpm with first-degree heart block.  Reviewed labs, imaging, and pertinent records as noted above in HPI  Assessment and Plan:  Acute respiratory failure with hypoxia Patient presented with complaints of shortness of breath.  Normally not on oxygen at baseline, but  requiring 4 L of oxygen maintain O2 saturation greater than 90%.  No significant wheezing appreciated on physical exam at this time but decreased overall aeration.  BNP noted to be within normal limits CT scan angiogram of the chest noted no acute pulmonary embolism, but did make note of chronic linear calcified thrombus in the segmental artery of the basal left lower lobe.  Prior right heart cath and noted elevated right greater than left heart filling pressures with preserved cardiac output and pulmonary venous hypertension. -Admit to a progressive bed -Continuous pulse oximetry with nasal cannula oxygen -Incentive spirometry and flutter valve -Check COVID and respiratory virus panel -Check ABG -Breathing treatments   Chest pain  Patient reported having left-sided chest pain that with radiation to the shoulder and neck.  High-sensitivity troponins negative x 2.  CT angiogram of the chest negative for any signs of a pulmonary embolism.  Patient scheduled for left and right heart cath with cardiology that for the 25th. -Cardiology consulted, follow-up for any further recommendations  Diastolic congestive heart failure Acute on Chronic. BNP noted to be 25.3 which is higher than most recent priors.  Last echocardiogram from 5/16 noted EF to be 60 to 65% with normal diastolic parameters. -Strict I&O's and daily weights -Lasix 80 mg IV bid  Hypokalemia Acute.  Initial potassium noted to be 3.3.  Patient had been given potassium chloride 40 meq po -Continue to monitor and replace as needed  Lower extremity swelling Patient with bilateral lower extremity swelling.  Erythema appreciated.  Question possibility of cellulitis -  Check CRP -Consider starting antibiotics if noted to be significantly elevated.  COPD -Breathing treatments as needed  Controlled diabetes mellitus type 2, with long-term use of insulin Last hemoglobin A1c 6.4. -Hypoglycemic protocols -Hold metformin -Continue pharmacy  substitution of Semglee at a reduced dose -CBGs before every meal with moderate SSI -Adjust regimen as needed  Paroxysmal atrial fibrillation Patient appears to be in a sinus rhythm at this time. -Continue diltiazem and Eliquis  Essential hypertension -Continue home blood pressure regimen as tolerated  Anxiety and depression -Continue current medication regimen  Seizure disorder -Seizure precautions -Continue Keppra and lacosamide  Morbid obesity BMI 46.22 kg/m  Obstructive sleep apnea -Continue CPAP   DVT prophylaxis: Eliquis Advance Care Planning:   Code Status: DNR confirmed with patient at bedside  Consults: none    Severity of Illness: The appropriate patient status for this patient is INPATIENT. Inpatient status is judged to be reasonable and necessary in order to provide the required intensity of service to ensure the patient's safety. The patient's presenting symptoms, physical exam findings, and initial radiographic and laboratory data in the context of their chronic comorbidities is felt to place them at high risk for further clinical deterioration. Furthermore, it is not anticipated that the patient will be medically stable for discharge from the hospital within 2 midnights of admission.   * I certify that at the point of admission it is my clinical judgment that the patient will require inpatient hospital care spanning beyond 2 midnights from the point of admission due to high intensity of service, high risk for further deterioration and high frequency of surveillance required.*  Author: Clydie Braun, MD 01/23/2023 8:14 AM  For on call review [email or web address].

## 2023-01-24 DIAGNOSIS — J9601 Acute respiratory failure with hypoxia: Secondary | ICD-10-CM | POA: Diagnosis not present

## 2023-01-24 DIAGNOSIS — I5033 Acute on chronic diastolic (congestive) heart failure: Secondary | ICD-10-CM | POA: Diagnosis not present

## 2023-01-24 LAB — CBC
HCT: 41.6 % (ref 39.0–52.0)
Hemoglobin: 13.4 g/dL (ref 13.0–17.0)
MCH: 29.3 pg (ref 26.0–34.0)
MCHC: 32.2 g/dL (ref 30.0–36.0)
MCV: 91 fL (ref 80.0–100.0)
Platelets: 242 10*3/uL (ref 150–400)
RBC: 4.57 MIL/uL (ref 4.22–5.81)
RDW: 15.4 % (ref 11.5–15.5)
WBC: 9.6 10*3/uL (ref 4.0–10.5)
nRBC: 0 % (ref 0.0–0.2)

## 2023-01-24 LAB — BASIC METABOLIC PANEL
Anion gap: 14 (ref 5–15)
BUN: 17 mg/dL (ref 8–23)
CO2: 28 mmol/L (ref 22–32)
Calcium: 8.9 mg/dL (ref 8.9–10.3)
Chloride: 97 mmol/L — ABNORMAL LOW (ref 98–111)
Creatinine, Ser: 0.77 mg/dL (ref 0.61–1.24)
GFR, Estimated: >60 mL/min (ref >60)
Glucose, Bld: 154 mg/dL — ABNORMAL HIGH (ref 70–99)
Potassium: 3.1 mmol/L — ABNORMAL LOW (ref 3.5–5.1)
Sodium: 139 mmol/L (ref 135–145)

## 2023-01-24 LAB — GLUCOSE, CAPILLARY
Glucose-Capillary: 197 mg/dL — ABNORMAL HIGH (ref 70–99)
Glucose-Capillary: 132 mg/dL — ABNORMAL HIGH (ref 70–99)
Glucose-Capillary: 128 mg/dL — ABNORMAL HIGH (ref 70–99)
Glucose-Capillary: 144 mg/dL — ABNORMAL HIGH (ref 70–99)

## 2023-01-24 LAB — BLOOD GAS, ARTERIAL
Acid-Base Excess: 6.1 mmol/L — ABNORMAL HIGH (ref 0.0–2.0)
Bicarbonate: 31.8 mmol/L — ABNORMAL HIGH (ref 20.0–28.0)
O2 Saturation: 95 %
Patient temperature: 37
pCO2 arterial: 49 mmHg — ABNORMAL HIGH (ref 32–48)
pH, Arterial: 7.42 (ref 7.35–7.45)
pO2, Arterial: 74 mmHg — ABNORMAL LOW (ref 83–108)

## 2023-01-24 LAB — C-REACTIVE PROTEIN: CRP: 1 mg/dL — ABNORMAL HIGH (ref <1.0)

## 2023-01-24 MED ORDER — ALBUTEROL SULFATE (2.5 MG/3ML) 0.083% IN NEBU
2.5000 mg | INHALATION_SOLUTION | Freq: Two times a day (BID) | RESPIRATORY_TRACT | Status: DC
  Administered 2023-01-24: 2.5 mg via RESPIRATORY_TRACT
  Filled 2023-01-24: qty 3

## 2023-01-24 MED ORDER — POTASSIUM CHLORIDE CRYS ER 20 MEQ PO TBCR
40.0000 meq | EXTENDED_RELEASE_TABLET | Freq: Two times a day (BID) | ORAL | Status: AC
  Administered 2023-01-24 (×2): 40 meq via ORAL
  Filled 2023-01-24 (×2): qty 2

## 2023-01-24 NOTE — Progress Notes (Signed)
PROGRESS NOTE PHYNIX MATEY  ZOX:[id number] DOB: [birth year] DOA: 01/22/2023 PCP: [name], PA-C  Brief Narrative/Hospital Course: 67 y.o.m w/ hypertension,CHF, PAF on Eliquis, aortic stenosis, CAD, CVA, diabetes mellitus type 2, COPD, AAA, prior history of tobacco abuse, morbid obesity, and OSA on CPAP presented with complaints of shortness of breath. He was intermittently having left-sided chest pain for the last 2 weeks, radiating into his neck and shoulder, recently had an echocardiogram done on 5/16 which noted EF to be 60 -65% with normal diastolic parameters.  Plan was for patient to have a right and left heart cath on the June 25.He had also may notice some mild redness of his lower extremities.    In ED: afebrile with pulse 89-111, RR 16-31,BP 91/47-130/112, O2 saturations noted to be as low as 89% with improvement ON 4 L of [place],Labs significant for WBC 13 >-11.8, potassium 3.3, BUN 24-> 28, creatinine 1->0.9, calcium 8.6->.8.4, BNP 25.3, high-sensitivity troponin negative x 2.  Cxr-no acute abnormality.  CT angiogram of the chest have been obtained which showed no evidence of a acute arterial embolic filling defect, but did note chronic linear calcified thrombus in the segmental artery of the basal left lower lobe. Patient was admitted for acute respiratory hypoxia in the setting of COPD exacerbation ABG showed no CO2 retention but said pCO2 in 70s  Subjective: Seen and examined Resting well on 4l  no complaints No cough Endorsed esertional chest pain dyspnea and orthpnea PTA- feels better w/ 45% recliner of bed Overnight patient has been afebrile BP stable remains on 4 L nasal cannula Labs showed hypokalemia 3.1 CRP 1.0 normal CBC initial leukocytosis resolved  Assessment and Plan: Principal Problem:   Acute hypoxic respiratory failure (HCC) Active Problems:   Chest pain   Chronic diastolic CHF (congestive heart failure) (HCC)   OSA (obstructive sleep apnea)   Type 2  diabetes mellitus with hyperlipidemia (HCC)   Essential hypertension   Seizure disorder (HCC)   AF (paroxysmal atrial fibrillation) (HCC)   COPD (chronic obstructive pulmonary disease) (HCC)   Acute respiratory failure with hypoxia and hypercapnia/dyspnea on exertion Chest pain: Patient needing 4 L nasal cannula.  On exam no significant wheezing but diminished breath sounds. BNP normal CT angio chest no PE but has right greater than left heart filling pressure, pulmonary venous Hypertension.  Some elevated WBC count but no evidence of infection, troponin negative x 2.  COVID and respiratory virus panel negative, ABG with pCO2 49 pO2 74.  He is on home Eliquis.  BNP could be falsely low in obesity-given his orthopnea and dyspnea on exertion hypoxia likely cardiac etiology suspect acute on chronic diastolic CHF in the setting of obesity/OSA. Continue on Breo Ellipta, Incruse Ellipta, bronchodilators, resume home cpap   Chest pain: Troponin negative x 2 given previous plan for left and right heart cath cardiology on consult  Acute on chronic Diastolic congestive heart failure Lower extremity edema: BNP likely falsely low over this obesity, last echocardiogram from 5/16 noted EF to be 60 to 65% with normal diastolic parameters. Continue IV Lasix, home metolazone q. Tuesday, Farxiga.  Monitor intake output, cardiology has been on consult Cont to monitor daily I/O,weight, electrolytes and net balance as below.Keep on  salt/fluid restricted diet and monitor in tele. Net IO Since Admission: -3,497 mL [01/24/23 1029]  Filed Weights   01/22/23 2211 01/24/23 0345  Weight: (!) 163.3 kg (!) 167.2 kg    Hypokalemia: Replete aggressively and recheck   Lower extremity swelling  Continue diuretics as above, CRP unremarkable less likely cellulitis   COPD: cont home inhalers see # 1  Type 2 diabetes mellitus with long-term use of insulin: controlled last A1c 6.4 continue SSI, and 13 units long-acting  insulin, Farxiga Recent Labs  Lab 01/19/23 0816 01/23/23 0315 01/23/23 1242 01/23/23 1644 01/23/23 2128 01/24/23 0753  GLUCAP  --  142* 150* 154* 135* 197*  HGBA1C 6.4*  --   --   --   --   --     PAF:  Rate controlled on Cardizem.  Continue Eliquis.     Essential hypertension: Stable on Cardizem Minipress, Lasix.   Anxiety and depression Mood stable continue as BuSpar, Abilify, venlafaxine   Seizure disorder Continue home Keppra and lacosamide  Morbid Obesity W/ [id number] Body mass index is 47.33 kg/m. : Will benefit with PCP follow-up, weight loss  healthy lifestyle and continue CPAP bedtime.  DVT prophylaxis: SCDs Start: 01/23/23 0527 Code Status:   Code Status: DNR Family Communication: plan of care discussed with patient at bedside. Patient status is: Inpatient because of acute Level of care: Progressive   Dispo: The patient is from: home            Anticipated disposition: tbd Objective: Vitals last 24 hrs: Vitals:   01/24/23 0619 01/24/23 0732 01/24/23 0734 01/24/23 0754  BP:    119/70  Pulse: 82   90  Resp:    18  Temp:    98.3 F (36.8 C)  TempSrc:    Oral  SpO2: 94% 92% 94% 92%  Weight:      Height:       Weight change: 3.901 kg  Physical Examination: General exam: alert awake, oriented, obese,older than stated age HEENT:Oral mucosa moist, Ear/Nose WNL grossly Respiratory system: bilaterally clear BS, no use of accessory muscle Cardiovascular system: S1 & S2 +, No JVD. Gastrointestinal system: Abdomen soft,NT,ND, BS+ Nervous System:Alert, awake, moving extremities. Extremities: LE edema mild,distal peripheral pulses palpable.  Skin: No rashes,no icterus. MSK: Normal muscle bulk,tone, power  Medications reviewed:  Scheduled Meds:  albuterol  2.5 mg Nebulization BID   allopurinol  200 mg Oral BID   apixaban  5 mg Oral BID   ARIPiprazole  5 mg Oral QHS   atorvastatin  40 mg Oral Daily   busPIRone  10 mg Oral TID   dapagliflozin  propanediol  10 mg Oral Q breakfast   diltiazem  120 mg Oral Daily   fenofibrate  160 mg Oral Daily   fluticasone furoate-vilanterol  1 puff Inhalation Daily   And   umeclidinium bromide  1 puff Inhalation Daily   furosemide  80 mg Intravenous BID   insulin aspart  0-15 Units Subcutaneous TID WC   insulin aspart  0-5 Units Subcutaneous QHS   insulin glargine-yfgn  30 Units Subcutaneous Daily   lacosamide  150 mg Oral BID   levETIRAcetam  500 mg Oral BID   metolazone  2.5 mg Oral Q Tue   metoprolol succinate  25 mg Oral Daily   mirtazapine  15 mg Oral QHS   potassium chloride  40 mEq Oral BID   prazosin  2 mg Oral QHS   pregabalin  150 mg Oral TID   sodium chloride flush  3 mL Intravenous Q12H   spironolactone  12.5 mg Oral Daily   traZODone  200 mg Oral QHS   venlafaxine XR  225 mg Oral Q breakfast  Continuous Infusions:   Diet Order  Diet heart healthy/carb modified Room service appropriate? Yes; Fluid consistency: Thin  Diet effective now                  Intake/Output Summary (Last 24 hours) at 01/24/2023 0859 Last data filed at 01/24/2023 0430 Gross per 24 hour  Intake 3 ml  Output 2900 ml  Net -2897 ml   Net IO Since Admission: -3,497 mL [01/24/23 0859]  Wt Readings from Last 3 Encounters:  01/24/23 (!) 167.2 kg  01/19/23 (!) 163.3 kg  01/12/23 (!) 162.8 kg     Unresulted Labs (From admission, onward)    None     Data Reviewed: I have personally reviewed following labs and imaging studies CBC: Recent Labs  Lab 01/22/23 2054 01/23/23 0538 01/24/23 0220  WBC 13.0* 11.8* 9.6  NEUTROABS  --  8.5*  --   HGB 14.1 13.5 13.4  HCT 44.2 41.9 41.6  MCV 92.1 91.3 91.0  PLT 269 260 242   Basic Metabolic Panel: Recent Labs  Lab 01/22/23 2054 01/23/23 0538 01/24/23 0220  NA 139 139 139  K 3.2* 3.3* 3.1*  CL 99 99 97*  CO2 30 30 28   GLUCOSE 91 92 154*  BUN 24* 28* 17  CREATININE 1.00 0.90 0.77  CALCIUM 8.6* 8.4* 8.9  MG  --  2.0  --    PHOS  --  3.3  --    GFR: Estimated Creatinine Clearance: 151.3 mL/min (by C-G formula based on SCr of 0.77 mg/dL). Liver Function Tests: Recent Labs  Lab 01/23/23 0538  AST 14*  ALT 19  ALKPHOS 72  BILITOT 0.5  PROT 6.5  ALBUMIN 3.2*   Recent Labs  Lab 01/23/23 0315 01/23/23 1242 01/23/23 1644 01/23/23 2128 01/24/23 0753  GLUCAP 142* 150* 154* 135* 197*   Recent Results (from the past 240 hour(s))  Respiratory (~20 pathogens) panel by PCR     Status: None   Collection Time: 01/23/23  8:35 AM   Specimen: Nasopharyngeal Swab; Respiratory  Result Value Ref Range Status   Adenovirus NOT DETECTED NOT DETECTED Final   Coronavirus 229E NOT DETECTED NOT DETECTED Final    Comment: (NOTE) The Coronavirus on the Respiratory Panel, DOES NOT test for the novel  Coronavirus (2019 nCoV)    Coronavirus HKU1 NOT DETECTED NOT DETECTED Final   Coronavirus NL63 NOT DETECTED NOT DETECTED Final   Coronavirus OC43 NOT DETECTED NOT DETECTED Final   Metapneumovirus NOT DETECTED NOT DETECTED Final   Rhinovirus / Enterovirus NOT DETECTED NOT DETECTED Final   Influenza A NOT DETECTED NOT DETECTED Final   Influenza B NOT DETECTED NOT DETECTED Final   Parainfluenza Virus 1 NOT DETECTED NOT DETECTED Final   Parainfluenza Virus 2 NOT DETECTED NOT DETECTED Final   Parainfluenza Virus 3 NOT DETECTED NOT DETECTED Final   Parainfluenza Virus 4 NOT DETECTED NOT DETECTED Final   Respiratory Syncytial Virus NOT DETECTED NOT DETECTED Final   Bordetella pertussis NOT DETECTED NOT DETECTED Final   Bordetella Parapertussis NOT DETECTED NOT DETECTED Final   Chlamydophila pneumoniae NOT DETECTED NOT DETECTED Final   Mycoplasma pneumoniae NOT DETECTED NOT DETECTED Final    Comment: Performed at [facility], [address], [place], [place] [address]  SARS Coronavirus 2 by RT PCR (hospital order, performed in [facility] hospital lab) *cepheid single result test* Anterior Nasal Swab     Status: None    Collection Time: 01/23/23  8:35 AM   Specimen: Anterior Nasal Swab  Result  Value Ref Range Status   SARS Coronavirus 2 by RT PCR NEGATIVE NEGATIVE Final    Comment: Performed at [facility], [address], [place], [place] [address]    Antimicrobials: Anti-infectives (From admission, onward)    Start     Dose/Rate Route Frequency Ordered Stop   01/22/23 0000  cephALEXin (KEFLEX) 500 MG capsule        500 mg Oral 4 times daily 01/22/23 2347        Culture/Microbiology    Component Value Date/Time   SDES URINE, CLEAN CATCH 08/07/2021 2100   SPECREQUEST NONE 08/07/2021 2100   CULT (A) 08/07/2021 2100    <10,000 COLONIES/mL INSIGNIFICANT GROWTH Performed at [facility], [address], [place], [place] [address]    REPTSTATUS 08/09/2021 FINAL 08/07/2021 2100  Radiology Studies: CT Angio Chest PE W/Cm &/Or Wo Cm  Result Date: 01/23/2023 CLINICAL DATA:  Chest pain or shortness breath. EXAM: CT ANGIOGRAPHY CHEST WITH CONTRAST TECHNIQUE: Multidetector CT imaging of the chest was performed using the standard protocol during bolus administration of intravenous contrast. Multiplanar CT image reconstructions and MIPs were obtained to evaluate the vascular anatomy. RADIATION DOSE REDUCTION: This exam was performed according to the departmental dose-optimization program which includes automated exposure control, adjustment of the mA and/or kV according to patient size and/or use of iterative reconstruction technique. CONTRAST:  75mL OMNIPAQUE IOHEXOL 350 MG/ML SOLN COMPARISON:  AP and lateral chest yesterday, CTA chest 10/26/2022, CTA chest 09/08/2018. FINDINGS: Cardiovascular: There is mild-to-moderate cardiomegaly with a left chamber predominance. Pulmonary veins are normal in caliber. There are three-vessel coronary artery calcifications in the proximal LAD stenting again noted. There is lipomatous infiltration of the inter-atrial septum. The pulmonary trunk prominent but stable  measuring 3 cm. There is IVC and hepatic vein reflux which may suggest tricuspid regurgitation or elevated right heart pressures but there is no enlargement of the right ventricle compared to the left. No arterial embolic filling defect is seen. There is a linear calcification within a segmental artery to the posterior basal left lower lobe consistent with a chronic linear calcified thrombus. There are moderate to heavy calcifications in the aortic valve leaflets, mild aortic tortuosity and mild aortic and great vessel atherosclerosis. There is no aortic aneurysm. There is no great vessel or aortic stenosis or dissection. There is a normal variant brachiobicarotid trunk. Mediastinum/Nodes: There are no thyroid nodules or axillary adenopathy. Single mildly prominent azygoesophageal recess node measures 1.4 cm and is unchanged. There are scattered bilateral slightly prominent hilar lymph nodes, largest on the right is 1.1 cm in the posterior mid hilum, largest on the left is 1 cm short axis in the lower hilum. There is no new, further or enlarging adenopathy. There is lipomatosis of the superior mediastinum exaggerating its diameter. Small hiatal hernia without esophageal thickening. The trachea and central airways are clear. Lungs/Pleura: There is no pleural effusion or pneumothorax. Mild paraseptal emphysematous changes are noted in the lung apices. There is diffuse bronchial thickening, greater in the lower lobes, with scattered bilateral lower lobe subsegmental bronchial impactions. There is posterior atelectasis without evidence of bronchopneumonia. There is mosaic attenuation in the upper lobes most likely due to small airways disease with air trapping. No focal pneumonia is evident.  No pulmonary nodules. Upper Abdomen: No acute abnormality. There is a cyst in hepatic segment 5 measuring 3.9 cm and 1.2 Hounsfield units. The liver is mildly steatotic. This was seen previously. Musculoskeletal: There is extensive  multilevel thoracic  spine bridging enthesopathy. No acute osseous findings or aggressive lesions. An elongate lipoma is again noted in the right infraspinatus muscle. Review of the MIP images confirms the above findings. IMPRESSION: 1. No evidence of acute arterial embolic filling defect. 2. Chronic linear calcified thrombus in a segmental artery to the posterior basal left lower lobe. 3. Cardiomegaly with lipomatous infiltration of the inter-atrial septum. 4. Aortic and coronary artery atherosclerosis. 5. Prominent pulmonary trunk but no right ventricular enlargement. 6. IVC and hepatic vein reflux which may suggest tricuspid regurgitation or elevated right heart pressures. 7. COPD with bronchial thickening and scattered subsegmental bronchial impactions in the lower lobes. 8. Mosaic attenuation in the upper lobes most likely due to small airways disease with air trapping. No appreciable focal pneumonia. 9. Stable mildly prominent lymph nodes. 10. Small hiatal hernia. 11. Emphysema. Aortic Atherosclerosis (ICD10-I70.0) and Emphysema (ICD10-J43.9). Electronically Signed   By: Almira Bar M.D.   On: 01/23/2023 04:50   DG Chest 2 View  Result Date: 01/22/2023 CLINICAL DATA:  Pain and shortness of breath EXAM: CHEST - 2 VIEW COMPARISON:  10/26/2022 FINDINGS: Cardiac shadow is enlarged. The overall inspiratory effort is within normal limits. No focal infiltrate is seen. No effusion is noted. IMPRESSION: No acute abnormality noted. Electronically Signed   By: Alcide Clever M.D.   On: 01/22/2023 21:33    LOS: 1 day  Lanae Boast, MD [facility]  01/24/2023, 8:59 AM

## 2023-01-24 NOTE — Progress Notes (Addendum)
CSW received consult for patient. CSW spoke with patient at bedside. Patient reports he comes from home with significant other. CSW offered patient food resources. Patient accepted. Patient reports he will transport by [facility] at dc. SDOH screening complete.All questions answered. No further questions reported at this time.

## 2023-01-24 NOTE — Hospital Course (Addendum)
66 y.o.m w/ hypertension,CHF, PAF on Eliquis, aortic stenosis, CAD, CVA, diabetes mellitus type 2, COPD, AAA, prior history of tobacco abuse, morbid obesity, and OSA on CPAP presented with complaints of shortness of breath. He was intermittently having left-sided chest pain for the last 2 weeks, radiating into his neck and shoulder, recently had an echocardiogram done on 5/16 which noted EF to be 60 -65% with normal diastolic parameters.  Plan was for patient to have a right and left heart cath on the June 25.He had also may notice some mild redness of his lower extremities.    In ED: afebrile with pulse 89-111, RR 16-31,BP 91/47-130/112, O2 saturations noted to be as low as 89% with improvement ON 4 L of [place],Labs significant for WBC 13 >-11.8, potassium 3.3, BUN 24-> 28, creatinine 1->0.9, calcium 8.6->.8.4, BNP 25.3, high-sensitivity troponin negative x 2.  Cxr-no acute abnormality.  CT angiogram of the chest have been obtained which showed no evidence of a acute arterial embolic filling defect, but did note chronic linear calcified thrombus in the segmental artery of the basal left lower lobe. Patient was admitted for acute respiratory hypoxia in the setting of COPD exacerbation ABG showed no CO2 retention but said pCO2 in 70s

## 2023-01-24 NOTE — Consult Note (Addendum)
Cardiology Consultation   Patient ID: Lawrence [name] MRN: [id number]; DOB: 1956/08/08  Admit date: 01/22/2023 Date of Consult: 01/24/2023  PCP:  [name], PA-C   [facility] Providers Cardiologist:  Armanda Magic, MD  Electrophysiologist:  Regan Lemming, MD  Advanced Heart Failure:  Marca Ancona, MD  {  Patient Profile:   Lawrence [name] is a 67 y.o. male with a history of non-obstructive CAD noted on cardiac catheterization in 2019, chronic diastolic CHF, paroxysmal atrial fibrillation on Eliquis, severe aortic stenosis noted on recent Echo in 12/2022, hypertension, hyperlipidemia, type 2 diabetes mellitus with peripheral neuropathy, COPD, obstructive sleep apnea on CPAP, seizure disorder, and prior tobacco use (quit about 6 years ago) who is being seen 01/24/2023 for the evaluation of CHF at the request of Dr. Madelyn Flavors.  History of Present Illness:   Lawrence [name] is a 67 year old male with the above history who is followed by Dr. Mayford Knife, Dr. Shirlee Latch, and Dr. Elberta Fortis. Patient has had multiple admissions for acute on chronic diastolic CHF in the past. Prior cardiac catheterization in 2019 showed non-obstructive disease and Myoview in 11/2020 was negative. He had Cardiomems placed in 08/2021. At visit in the CHF Clinic in 11/2022, Cardiomems was slightly above goal and he was started on Metolazone once a week. Repeat Echo was also ordered and  showed LVEF of 60-65% with normal wall motion and diastolic parameters but progression of known aortic stenosis to the severe range (mean gradient 40.0 mmHg and AVA by VTI 1.03 cm2). He was recently seen by Anna Genre, PA-C, on 01/12/2023 at which time he described dyspnea with minimal exertion and abdominal distension but weight was down 7lbs. R/LHC was arranged for further evaluation of his aortic stenosis and this was scheduled for 01/31/2023.  He presented to [facility] ED on 01/22/2023 for further evaluation of shortness  of breath and chest pain. Upon arrival to the ED, BP was soft and he was mildly hypoxic with O2 sats as low as 89% that improved with 4L of O2 via nasal cannula. EKG showed no acute findings. High-sensitivity troponin negative x2 (8 >> 8). BNP normal at 25.3 but higher than it was on 01/12/2023. Chest x-ray showed no acute findings. Chest CTA negative for acute PE but showed a chronic liner calcified thrombus in a segmental artery to the posterior basal left lower lobe, cardiomegaly with lipomatous infiltration of the inter-atrial septum, IVC and hepatic vein reflex which may suggest tricuspid regurgitation or elevated right heart pressure. CTA also showed COPD with bronchial thickening and scattered subsegmental bronchial impactions in the lower lobes, mosaic attenuation in the upper lobes most likely due to small airways disease with air trapping, and emphysema. WBC 13, Hgb 14.1, Plts 269. Na 139, K 3.2, Glucose 91, BUN 24, Cr 1.0. Respiratory panel negative. ABG showed pH of 7.42, pCO2 49, pO2 74, and bicarb 31.8. Patient was admitted with acute hypoxic respiratory failure and was started on IV Lasix. Cardiology was consulted for further evaluation.  At the time of this evaluation patient is resting comfortably in no acute distress. He has had good urinary output on the IV Lasix.  When asked what brought him into the hospital, he states "I cannot remember."  I reminded him that per ED and H&P, it sounds like he came in due to worsening shortness of breath and chest pain.  This did seem to trigger his memory.  He reports worsening shortness of breath over the last  several weeks.  He describes dyspnea with very minimal activity such as only walking a few feet.  No significant shortness of breath at rest.  He describes stable orthopnea and denies any trouble breathing at night as long as he uses his CPAP machine.  He also reports worsening lower extremity edema over the last few weeks as well as abdominal distension  and weight gain but cannot tell me exactly how much weight he has gained.  He reports sharp pain in the center of his chest as well as sharp pain in his left shoulder on the day of admission that was coming and going for about an hour.  He denies any other recent chest pain.  He notes occasional brief palpitations but states this is not new.  No lightheadedness, dizziness, syncope.  He has a chronic productive cough with his COPD but no recent fevers or illnesses.  No nausea or vomiting.  No abnormal bleeding in urine or stools. He also reports some memory issues over the last 6 months but has not seen anyone about this yet.  Past Medical History:  Diagnosis Date   Acquired dilation of ascending aorta and aortic root (HCC)    40mm by echo 01/2021   Adenomatous colon polyp 2007   Anemia    Anxiety    Aortic stenosis    moderate AS by echo 01/2021   Asthma    BPH without obstruction/lower urinary tract symptoms 02/22/2017   Chronic diastolic (congestive) heart failure (HCC)    Chronic venous stasis 03/07/2019   COPD (chronic obstructive pulmonary disease) (HCC)    Coronary artery calcification seen on CAT scan    Depression    Diabetic neuropathy (HCC) 09/11/2019   History of colon polyps 08/24/2018   Hypertension    Morbid obesity (HCC)    OSA (obstructive sleep apnea)    Pain due to onychomycosis of toenails of both feet 09/11/2019   Peripheral neuropathy 02/22/2017   Primary osteoarthritis, left shoulder 03/05/2017   PTSD (post-traumatic stress disorder)    Pure hypercholesterolemia    QT prolongation 03/07/2019   Seizures (HCC)    Shortness of breath    Sinus tachycardia 03/07/2019   Sleep apnea    CPAP   Type 2 diabetes mellitus with vascular disease (HCC) 09/11/2019    Past Surgical History:  Procedure Laterality Date   ENDOVENOUS ABLATION SAPHENOUS VEIN W/ LASER Right 08/20/2020   endovenous laser ablation right greater saphenous vein by Cari Caraway MD    ENDOVENOUS  ABLATION SAPHENOUS VEIN W/ LASER Left 11/16/2022   endovenous laser ablation left greater saphenous vein by Cari Caraway MD   JOINT REPLACEMENT     left knee replacement x 2   KNEE ARTHROSCOPY Bilateral    LEFT HEART CATH AND CORONARY ANGIOGRAPHY N/A 01/17/2018   Procedure: LEFT HEART CATH AND CORONARY ANGIOGRAPHY;  Surgeon: Marykay Lex, MD;  Location: [facility] INVASIVE CV LAB;  Service: Cardiovascular;  Laterality: N/A;   PRESSURE SENSOR/CARDIOMEMS N/A 08/26/2021   Procedure: PRESSURE SENSOR/CARDIOMEMS;  Surgeon: Laurey Morale, MD;  Location: [facility] INVASIVE CV LAB;  Service: Cardiovascular;  Laterality: N/A;   RIGHT HEART CATH N/A 08/26/2021   Procedure: RIGHT HEART CATH;  Surgeon: Laurey Morale, MD;  Location[facility] INVASIVE CV LAB;  Service: Cardiovascular;  Laterality: N/A;   TEE WITHOUT CARDIOVERSION N/A 08/26/2021   Procedure: TRANSESOPHAGEAL ECHOCARDIOGRAM (TEE);  Surgeon: Laurey Morale, MD;  Location: [facility] ENDOSCOPY;  Service: Cardiovascular;  Laterality: N/A;   UMBILICAL HERNIA  REPAIR       Home Medications:  Prior to Admission medications   Medication Sig Start Date End Date Taking? Authorizing Provider  acetaminophen (TYLENOL) 650 MG CR tablet Take 1,300 mg by mouth every 8 (eight) hours as needed for pain.   Yes [provider]  albuterol (VENTOLIN HFA) 108 (90 Base) MCG/ACT inhaler Inhale 2 puffs into the lungs every 6 (six) hours as needed for wheezing or shortness of breath. 08/18/22  Yes Hunsucker, Lesia Sago, MD  allopurinol (ZYLOPRIM) 100 MG tablet Take 200 mg by mouth 2 (two) times daily.   Yes [provider]  ARIPiprazole (ABILIFY) 5 MG tablet TAKE ONE TABLET BY MOUTH EVERYDAY AT BEDTIME Patient taking differently: Take 5 mg by mouth at bedtime. 03/09/22  Yes Mozingo, Thereasa Solo, NP  atorvastatin (LIPITOR) 40 MG tablet Take 1 tablet (40 mg total) by mouth daily. 10/12/22  Yes Conte, Tessa N, PA-C  busPIRone (BUSPAR) 10 MG tablet Take 1 tablet (10 mg  total) by mouth 3 (three) times daily. 03/09/22  Yes Mozingo, Thereasa Solo, NP  cephALEXin (KEFLEX) 500 MG capsule Take 1 capsule (500 mg total) by mouth 4 (four) times daily. 01/22/23  Yes Schuman, Beverly Gust, PA-C  cetirizine (ZYRTEC) 10 MG tablet Take 10 mg by mouth at bedtime.    Yes [provider]  Cholecalciferol (VITAMIN D3 PO) Take 2,000 Units by mouth daily.   Yes [provider]  dapagliflozin propanediol (FARXIGA) 10 MG TABS tablet TAKE ONE TABLET BY MOUTH BEFORE BREAKFAST Patient taking differently: Take 10 mg by mouth daily with breakfast. 08/05/22  Yes Reather Littler, MD  diltiazem (CARDIZEM CD) 120 MG 24 hr capsule TAKE ONE CAPSULE BY MOUTH ONCE DAILY 12/27/22  Yes Laurey Morale, MD  ELIQUIS 5 MG TABS tablet TAKE ONE TABLET BY MOUTH TWICE DAILY Patient taking differently: Take 5 mg by mouth 2 (two) times daily. 08/05/22  Yes Laurey Morale, MD  fenofibrate (TRICOR) 145 MG tablet Take 1 tablet (145 mg total) by mouth daily. 10/12/22  Yes Conte, Tessa N, PA-C  ferrous sulfate 324 (65 Fe) MG TBEC Take 324 mg by mouth daily with breakfast.    Yes [provider]  fluticasone (FLONASE) 50 MCG/ACT nasal spray Place 2 sprays into both nostrils daily.    Yes [provider]  Fluticasone-Umeclidin-Vilant (TRELEGY ELLIPTA) 200-62.5-25 MCG/ACT AEPB INHALE 1 PUFF BY MOUTH INTO LUNGS ONCE DAILY Patient taking differently: Inhale 1 puff into the lungs daily. 08/18/22  Yes Hunsucker, Lesia Sago, MD  insulin aspart (NOVOLOG FLEXPEN) 100 UNIT/ML FlexPen INJECT 12 UNITS BEFORE MEALS, keep sugar TWO HOURS AFTER meals UNDER 180 AT least Patient taking differently: Inject 12 Units into the skin 3 (three) times daily with meals. 01/23/23  Yes Motwani, Carin Hock, MD  Lacosamide 150 MG TABS Take 1 tablet (150 mg total) by mouth 2 (two) times daily. 10/11/22  Yes Windell Norfolk, MD  levETIRAcetam (KEPPRA) 500 MG tablet Take 1 tablet (500 mg total) by mouth 2 (two) times daily. 10/11/22   Yes Windell Norfolk, MD  losartan (COZAAR) 25 MG tablet TAKE ONE TABLET BY MOUTH ONCE DAILY 12/28/22  Yes Laurey Morale, MD  metFORMIN (GLUCOPHAGE-XR) 500 MG 24 hr tablet Take 4 tablets (2,000 mg total) by mouth daily. 06/16/21  Yes Romero Belling, MD  metolazone (ZAROXOLYN) 2.5 MG tablet Take 1 tablet (2.5 mg total) by mouth once a week. Every Tuesday 12/05/22 03/05/23 Yes Laurey Morale, MD  metoprolol succinate (TOPROL-XL) 25 MG  24 hr tablet Take 1 tablet (25 mg total) by mouth daily. 09/08/22  Yes Laurey Morale, MD  mirtazapine (REMERON) 15 MG tablet TAKE ONE TABLET BY MOUTH EVERYDAY AT BEDTIME Patient taking differently: Take 15 mg by mouth at bedtime. 12/28/22  Yes Mozingo, Thereasa Solo, NP  omeprazole (PRILOSEC) 20 MG capsule Take 20 mg by mouth every morning. 05/29/20  Yes [provider]  potassium chloride SA (KLOR-CON M) 20 MEQ tablet Take 2 tablets (40 mEq total) by mouth 2 (two) times daily. Except Tuesdays when you take ( 3 Tabs) 12/05/22  Yes Laurey Morale, MD  prazosin (MINIPRESS) 2 MG capsule Take two tablets at bedtime. Patient taking differently: Take 2 mg by mouth at bedtime. 03/09/22  Yes Mozingo, Thereasa Solo, NP  pregabalin (LYRICA) 150 MG capsule Take 1 capsule (150 mg total) by mouth 3 (three) times daily. 10/11/22  Yes Camara, Amalia Hailey, MD  repaglinide (PRANDIN) 2 MG tablet TAKE TWO TABLETS BY MOUTH TWICE DAILY BEFORE A meal Patient taking differently: Take 2 mg by mouth 2 (two) times daily before a meal. 10/04/22  Yes Reather Littler, MD  spironolactone (ALDACTONE) 25 MG tablet Take 0.5 tablets (12.5 mg total) by mouth daily. 11/16/22  Yes Turner, Cornelious Bryant, MD  tirzepatide [facility]) 12.5 MG/0.5ML Pen Inject 12.5 mg into the skin once a week. 01/19/23  Yes Motwani, Komal, MD  torsemide (DEMADEX) 20 MG tablet Take 5 tablets (100 mg total) by mouth 2 (two) times daily. 12/29/22  Yes Clegg, Amy D, NP  traZODone (DESYREL) 100 MG tablet Take 200 mg by mouth at bedtime.  10/18/22  Yes [provider]  TRESIBA FLEXTOUCH 100 UNIT/ML FlexTouch Pen INJECT 40 UNITS into THE SKIN ONCE DAILY Patient taking differently: Inject 40 Units into the skin daily. 10/14/22  Yes Reather Littler, MD  VASCEPA 1 g capsule TAKE TWO CAPSULES BY MOUTH TWICE DAILY 08/05/22  Yes Laurey Morale, MD  venlafaxine XR (EFFEXOR-XR) 75 MG 24 hr capsule Take 3 capsules (225 mg total) by mouth daily with breakfast. 03/09/22  Yes Mozingo, Thereasa Solo, NP  diclofenac Sodium (VOLTAREN) 1 % GEL Apply 4 g topically 4 (four) times daily. To right knee. Patient not taking: Reported on 01/23/2023 10/31/22   Arrien, York Ram, MD  Insulin Pen Needle (B-D ULTRAFINE III SHORT PEN) 31G X 8 MM MISC Inject 1 each into the skin 3 (three) times daily before meals. Use as instructed 4X daily. 12/06/22   Reather Littler, MD    Inpatient Medications: Scheduled Meds:  allopurinol  200 mg Oral BID   apixaban  5 mg Oral BID   ARIPiprazole  5 mg Oral QHS   atorvastatin  40 mg Oral Daily   busPIRone  10 mg Oral TID   dapagliflozin propanediol  10 mg Oral Q breakfast   diltiazem  120 mg Oral Daily   fenofibrate  160 mg Oral Daily   fluticasone furoate-vilanterol  1 puff Inhalation Daily   And   umeclidinium bromide  1 puff Inhalation Daily   furosemide  80 mg Intravenous BID   insulin aspart  0-15 Units Subcutaneous TID WC   insulin aspart  0-5 Units Subcutaneous QHS   insulin glargine-yfgn  30 Units Subcutaneous Daily   lacosamide  150 mg Oral BID   levETIRAcetam  500 mg Oral BID   metolazone  2.5 mg Oral Q Tue   metoprolol succinate  25 mg Oral Daily   mirtazapine  15 mg Oral QHS  potassium chloride  40 mEq Oral BID   prazosin  2 mg Oral QHS   pregabalin  150 mg Oral TID   sodium chloride flush  3 mL Intravenous Q12H   spironolactone  12.5 mg Oral Daily   traZODone  200 mg Oral QHS   venlafaxine XR  225 mg Oral Q breakfast   Continuous Infusions:  PRN Meds: acetaminophen **OR**  acetaminophen, albuterol, melatonin, ondansetron (ZOFRAN) IV, oxyCODONE  Allergies:    Allergies  Allergen Reactions   Vancomycin Other (See Comments)    "Red Man Syndrome" 02/02/17: possible cause for rash under both arms   Niacin And Related Other (See Comments)    Red man syndrome   Tubersol [Tuberculin, Ppd] Other (See Comments)    Reaction unknown   Doxycycline Rash and Other (See Comments)    Social History:   Social History   Socioeconomic History   Marital status: Significant Other    Spouse name: [name]   Number of children: 1   Years of education: Not on file   Highest education level: Associate degree: academic program  Occupational History   Occupation: retired    Comment: Building control surveyor  Tobacco Use   Smoking status: Former    Packs/day: 1.00    Years: 44.00    Additional pack years: 0.00    Total pack years: 44.00    Types: Cigarettes    Quit date: 03/07/2016    Years since quitting: 6.8    Passive exposure: Past   Smokeless tobacco: Never  Vaping Use   Vaping Use: Never used  Substance and Sexual Activity   Alcohol use: Yes    Comment: rare   Drug use: No   Sexual activity: Not on file  Other Topics Concern   Not on file  Social History Narrative   Lives with wife Lawrence [name]   Right Handed   Drinks 1-2 cups caffeine   Social Determinants of Health   Financial Resource Strain: Medium Risk (06/08/2021)   Overall Financial Resource Strain (CARDIA)    Difficulty of Paying Living Expenses: Somewhat hard  Food Insecurity: Food Insecurity Present (01/23/2023)   Hunger Vital Sign    Worried About Running Out of Food in the Last Year: Sometimes true    Ran Out of Food in the Last Year: Sometimes true  Transportation Needs: No Transportation Needs (01/23/2023)   PRAPARE - Administrator, Civil Service (Medical): No    Lack of Transportation (Non-Medical): No  Physical Activity: Not on file  Stress: Not on file  Social Connections: Not  on file  Intimate Partner Violence: Unknown (01/23/2023)   Humiliation, Afraid, Rape, and Kick questionnaire    Fear of Current or Ex-Partner: No    Emotionally Abused: No    Physically Abused: Patient unable to answer    Sexually Abused: No    Family History:   Family History  Problem Relation Age of Onset   CAD Maternal Grandfather    Diabetes Other    Diabetes Mellitus II Neg Hx    Colon cancer Neg Hx    Esophageal cancer Neg Hx    Inflammatory bowel disease Neg Hx    Liver disease Neg Hx    Pancreatic cancer Neg Hx    Rectal cancer Neg Hx    Stomach cancer Neg Hx    Sleep apnea Neg Hx      ROS:  Please see the history of present illness.  Review of Systems  Constitutional:  Negative for chills and fever.  HENT:  Negative for congestion.   Respiratory:  Positive for cough, sputum production and shortness of breath. Negative for hemoptysis.   Cardiovascular:  Positive for chest pain, palpitations, orthopnea (chronic and stable) and leg swelling. Negative for PND.  Gastrointestinal:  Negative for blood in stool, melena, nausea and vomiting.  Genitourinary:  Negative for hematuria.  Musculoskeletal:  Negative for myalgias.  Neurological:  Negative for dizziness and loss of consciousness.  Endo/Heme/Allergies:  Does not bruise/bleed easily.  Psychiatric/Behavioral:  Positive for memory loss. Negative for substance abuse (prior tobacco use).    Physical Exam/Data:   Vitals:   01/24/23 0732 01/24/23 0734 01/24/23 0754 01/24/23 1158  BP:   119/70 117/66  Pulse:   90 82  Resp:   18 18  Temp:   98.3 F (36.8 C) 98 F (36.7 C)  TempSrc:   Oral Oral  SpO2: 92% 94% 92% 95%  Weight:      Height:        Intake/Output Summary (Last 24 hours) at 01/24/2023 1413 Last data filed at 01/24/2023 1202 Gross per 24 hour  Intake 3 ml  Output 4300 ml  Net -4297 ml      01/24/2023    3:45 AM 01/22/2023   10:11 PM 01/19/2023    7:58 AM  Last 3 Weights  Weight (lbs) 368 lb 9.6 oz  360 lb 360 lb  Weight (kg) 167.196 kg 163.295 kg 163.295 kg     Body mass index is 47.33 kg/m.  General: 67 y.o. morbidly obese Caucasian male resting comfortably in no acute distress. HEENT: Normocephalic and atraumatic. Sclera clear.  Neck: Supple. JVD difficult to assess due to body habitus. Heart: RRR. Distinct S1 and S2. No murmurs, gallops, or rubs.  Lungs: No increased work of breathing. Clear to ausculation bilaterally. No wheezes, rhonchi, or rales.  Abdomen: Soft, obese, and non-tender to palpitation. Bowel sounds presents. Extremities: Trace lower extremity edema.  Skin: Warm and dry. Hyperpigmentation of bilateral lower extremities consistent with chronic venous insufficiency. Neuro: Alert and oriented x3. No focal deficits. Psych: Normal affect. Responds appropriately.   EKG:  The EKG was personally reviewed and demonstrates:  Normal sinus rhythm with no acute ST/T changes. Telemetry:  Telemetry was personally reviewed and demonstrates:  Normal sinus rhythm with rates in the 70s. Occasional PVCs.  Relevant CV Studies:  Echocardiogram 12/22/2022: Impressions: 1. Left ventricular ejection fraction, by estimation, is 60 to 65%. The  left ventricle has normal function. The left ventricle has no regional  wall motion abnormalities. There is mild left ventricular hypertrophy.  Left ventricular diastolic parameters  were normal.   2. Right ventricular systolic function is normal. The right ventricular  size is normal.   3. Left atrial size was moderately dilated.   4. The mitral valve is abnormal. No evidence of mitral valve  regurgitation. No evidence of mitral stenosis.   5. AS has progressed since TTE done 05/2022. The aortic valve was not  well visualized. There is moderate calcification of the aortic valve.  There is moderate thickening of the aortic valve. Aortic valve  regurgitation is not visualized. Severe aortic  valve stenosis.   6. Aortic dilatation noted.  There is mild dilatation of the aortic root,  measuring 38 mm. There is mild dilatation of the ascending aorta,  measuring 40 mm.   7. The inferior vena cava is normal in size with greater than 50%  respiratory variability, suggesting right atrial pressure of  3 mmHg.   Laboratory Data:  High Sensitivity Troponin:   Recent Labs  Lab 01/22/23 2054 01/22/23 2348  TROPONINIHS 8 8     Chemistry Recent Labs  Lab 01/22/23 2054 01/23/23 0538 01/24/23 0220  NA 139 139 139  K 3.2* 3.3* 3.1*  CL 99 99 97*  CO2 30 30 28   GLUCOSE 91 92 154*  BUN 24* 28* 17  CREATININE 1.00 0.90 0.77  CALCIUM 8.6* 8.4* 8.9  MG  --  2.0  --   GFRNONAA >60 >60 >60  ANIONGAP 10 10 14     Recent Labs  Lab 01/23/23 0538  PROT 6.5  ALBUMIN 3.2*  AST 14*  ALT 19  ALKPHOS 72  BILITOT 0.5   Lipids No results for input(s): "CHOL", "TRIG", "HDL", "LABVLDL", "LDLCALC", "CHOLHDL" in the last 168 hours.  Hematology Recent Labs  Lab 01/22/23 2054 01/23/23 0538 01/24/23 0220  WBC 13.0* 11.8* 9.6  RBC 4.80 4.59 4.57  HGB 14.1 13.5 13.4  HCT 44.2 41.9 41.6  MCV 92.1 91.3 91.0  MCH 29.4 29.4 29.3  MCHC 31.9 32.2 32.2  RDW 15.6* 15.7* 15.4  PLT 269 260 242   Thyroid No results for input(s): "TSH", "FREET4" in the last 168 hours.  BNP Recent Labs  Lab 01/22/23 2054  BNP 25.3    DDimer No results for input(s): "DDIMER" in the last 168 hours.   Radiology/Studies:  CT Angio Chest PE W/Cm &/Or Wo Cm  Result Date: 01/23/2023 CLINICAL DATA:  Chest pain or shortness breath. EXAM: CT ANGIOGRAPHY CHEST WITH CONTRAST TECHNIQUE: Multidetector CT imaging of the chest was performed using the standard protocol during bolus administration of intravenous contrast. Multiplanar CT image reconstructions and MIPs were obtained to evaluate the vascular anatomy. RADIATION DOSE REDUCTION: This exam was performed according to the departmental dose-optimization program which includes automated exposure control,  adjustment of the mA and/or kV according to patient size and/or use of iterative reconstruction technique. CONTRAST:  75mL OMNIPAQUE IOHEXOL 350 MG/ML SOLN COMPARISON:  AP and lateral chest yesterday, CTA chest 10/26/2022, CTA chest 09/08/2018. FINDINGS: Cardiovascular: There is mild-to-moderate cardiomegaly with a left chamber predominance. Pulmonary veins are normal in caliber. There are three-vessel coronary artery calcifications in the proximal LAD stenting again noted. There is lipomatous infiltration of the inter-atrial septum. The pulmonary trunk prominent but stable measuring 3 cm. There is IVC and hepatic vein reflux which may suggest tricuspid regurgitation or elevated right heart pressures but there is no enlargement of the right ventricle compared to the left. No arterial embolic filling defect is seen. There is a linear calcification within a segmental artery to the posterior basal left lower lobe consistent with a chronic linear calcified thrombus. There are moderate to heavy calcifications in the aortic valve leaflets, mild aortic tortuosity and mild aortic and great vessel atherosclerosis. There is no aortic aneurysm. There is no great vessel or aortic stenosis or dissection. There is a normal variant brachiobicarotid trunk. Mediastinum/Nodes: There are no thyroid nodules or axillary adenopathy. Single mildly prominent azygoesophageal recess node measures 1.4 cm and is unchanged. There are scattered bilateral slightly prominent hilar lymph nodes, largest on the right is 1.1 cm in the posterior mid hilum, largest on the left is 1 cm short axis in the lower hilum. There is no new, further or enlarging adenopathy. There is lipomatosis of the superior mediastinum exaggerating its diameter. Small hiatal hernia without esophageal thickening. The trachea and central airways are clear. Lungs/Pleura: There is no pleural effusion or  pneumothorax. Mild paraseptal emphysematous changes are noted in the lung  apices. There is diffuse bronchial thickening, greater in the lower lobes, with scattered bilateral lower lobe subsegmental bronchial impactions. There is posterior atelectasis without evidence of bronchopneumonia. There is mosaic attenuation in the upper lobes most likely due to small airways disease with air trapping. No focal pneumonia is evident.  No pulmonary nodules. Upper Abdomen: No acute abnormality. There is a cyst in hepatic segment 5 measuring 3.9 cm and 1.2 Hounsfield units. The liver is mildly steatotic. This was seen previously. Musculoskeletal: There is extensive multilevel thoracic spine bridging enthesopathy. No acute osseous findings or aggressive lesions. An elongate lipoma is again noted in the right infraspinatus muscle. Review of the MIP images confirms the above findings. IMPRESSION: 1. No evidence of acute arterial embolic filling defect. 2. Chronic linear calcified thrombus in a segmental artery to the posterior basal left lower lobe. 3. Cardiomegaly with lipomatous infiltration of the inter-atrial septum. 4. Aortic and coronary artery atherosclerosis. 5. Prominent pulmonary trunk but no right ventricular enlargement. 6. IVC and hepatic vein reflux which may suggest tricuspid regurgitation or elevated right heart pressures. 7. COPD with bronchial thickening and scattered subsegmental bronchial impactions in the lower lobes. 8. Mosaic attenuation in the upper lobes most likely due to small airways disease with air trapping. No appreciable focal pneumonia. 9. Stable mildly prominent lymph nodes. 10. Small hiatal hernia. 11. Emphysema. Aortic Atherosclerosis (ICD10-I70.0) and Emphysema (ICD10-J43.9). Electronically Signed   By: Almira Bar M.D.   On: 01/23/2023 04:50   DG Chest 2 View  Result Date: 01/22/2023 CLINICAL DATA:  Pain and shortness of breath EXAM: CHEST - 2 VIEW COMPARISON:  10/26/2022 FINDINGS: Cardiac shadow is enlarged. The overall inspiratory effort is within normal  limits. No focal infiltrate is seen. No effusion is noted. IMPRESSION: No acute abnormality noted. Electronically Signed   By: Alcide Clever M.D.   On: 01/22/2023 21:33     Assessment and Plan:   Acute Hypoxic Respiratory Failure  Acute on Chronic Diastolic CHF Patient presented with shortness of breath and chest pain. BNP normal at 25 but this could be falsely low in setting of obesity (up slightly from 19.5 on 01/12/2023). Chest x-ray and CTA showed no overt edema. Recent Echo on 12/22/2022 showed 60-65% with normal wall motion and diastolic parameters but progression of known aortic stenosis to the severe range. Cardiomems on 01/16/2023 was mildly elevated suggestive of fluid accumulation. He was started on IV Lasix with good urinary response. Net negative 4.9 L this admission. Weight is up 9 lbs from last office visit on 01/12/2023. Renal function stable.  - Volume status is difficult to assess due to body habitus but suspect he is still volume overloaded. - Continue IV Lasix 80mg  twice daily.  - Continue Spironolactone 12.5mg  daily. - Continue Farxiga 10mg  daily. - He also received his home dose of Metolazone 2.5mg  today which he takes once a week. - He is scheduled to have a R/LHC next week for further evaluation of his aortic stenosis. Will go ahead and do this while here. Last dose of Eliquis was this morning so will plan for cath on Thursday 01/26/2023.  Chest Pain Non-Obstructive CAD LHC in 2019 showed non-obstructive CAD. Myoview in 2022 was negative. He presented with some sharp chest and shoulder pain. EKG showed no acute changes and high-sensitivity troponin negative x2. - Currently chest pain free. - Chest pain may just be due to volume overloaded. However, plans is for [facility] as  above.  Paroxysmal Atrial Fibrillation Maintaining sinus rhythm.  - Continue Diltiazem 120mg  daily and Toprol-XL 25mg  daily. - Currently on anticoagulation with Eliquis 5mg  twice daily. Last dose was this  morning. Will hold in anticipation for [facility].  Severe Aortic Stenosis Recent Echo in 12/2022 showed severe aortic stenosis with mean gradient 40.0 mmHg and AVA by VTI 1.03 cm2  Hypertension BP soft on admission with systolic BP in the 90s but has since improved. - Continue current medications: Spironolactone 12.5mg  daily, Diltiazem 120mg  daily, and Toprol-XL 25mg  daily.  - Also on Losartan at home but this is currently on hold.  Hyperlipidemia - Continue Lipitor 40mg  daily.  Hypokalemia Potassium 3.1 today. Being repleted by primary team. - Will continue to monitor.   Otherwise, per primary team: - COPD - Type 2 diabetes mellitus  - Seizure disorder - Anxiety and depression  Risk Assessment/Risk Scores:   New York Heart Association (NYHA) Functional Class NYHA Class III  CHA2DS2-VASc Score = 5  This indicates a 7.2% annual risk of stroke. The patient's score is based upon: CHF History: 1 HTN History: 1 Diabetes History: 1 Stroke History: 0 Vascular Disease History: 1 Age Score: 1 Gender Score: 0    For questions or updates, please contact [facility] Please consult [email or web address] for contact info under    Signed, Corrin Parker, PA-C  01/24/2023 2:13 PM  Personally seen and examined. Agree with above.  67 year old patient of Dr. Alford Highland here with severe aortic stenosis mean gradient 40 mmHg shortness of breath.  Plan will be for right and left heart catheterization Thursday.  Will diurese with IV Lasix.  He has received metolazone earlier.  Has CardioMEMS device.  Showed slight increase in fluid.  Creatinine 1.0.  Nonobstructive CAD shown on left heart catheterization in 2019.  2022 Myoview was normal.  Currently maintaining sinus rhythm.  Holding Eliquis in anticipation of heart catheterization.  2/6 systolic murmur appreciated, alert and oriented, morbidly obese  Donato Schultz, MD

## 2023-01-25 DIAGNOSIS — Z794 Long term (current) use of insulin: Secondary | ICD-10-CM

## 2023-01-25 DIAGNOSIS — E1165 Type 2 diabetes mellitus with hyperglycemia: Secondary | ICD-10-CM

## 2023-01-25 DIAGNOSIS — I5033 Acute on chronic diastolic (congestive) heart failure: Secondary | ICD-10-CM | POA: Diagnosis not present

## 2023-01-25 DIAGNOSIS — I5032 Chronic diastolic (congestive) heart failure: Secondary | ICD-10-CM | POA: Diagnosis not present

## 2023-01-25 DIAGNOSIS — J9601 Acute respiratory failure with hypoxia: Secondary | ICD-10-CM | POA: Diagnosis not present

## 2023-01-25 LAB — GLUCOSE, CAPILLARY
Glucose-Capillary: 140 mg/dL — ABNORMAL HIGH (ref 70–99)
Glucose-Capillary: 163 mg/dL — ABNORMAL HIGH (ref 70–99)
Glucose-Capillary: 145 mg/dL — ABNORMAL HIGH (ref 70–99)
Glucose-Capillary: 209 mg/dL — ABNORMAL HIGH (ref 70–99)

## 2023-01-25 LAB — BASIC METABOLIC PANEL
Anion gap: 10 (ref 5–15)
BUN: 19 mg/dL (ref 8–23)
CO2: 32 mmol/L (ref 22–32)
Calcium: 9 mg/dL (ref 8.9–10.3)
Chloride: 97 mmol/L — ABNORMAL LOW (ref 98–111)
Creatinine, Ser: 0.85 mg/dL (ref 0.61–1.24)
GFR, Estimated: >60 mL/min (ref >60)
Glucose, Bld: 125 mg/dL — ABNORMAL HIGH (ref 70–99)
Potassium: 3.7 mmol/L (ref 3.5–5.1)
Sodium: 139 mmol/L (ref 135–145)

## 2023-01-25 LAB — MAGNESIUM: Magnesium: 2.1 mg/dL (ref 1.7–2.4)

## 2023-01-25 MED ORDER — METOPROLOL SUCCINATE ER 50 MG PO TB24
50.0000 mg | ORAL_TABLET | Freq: Every day | ORAL | Status: DC
  Administered 2023-01-27 – 2023-02-08 (×11): 50 mg via ORAL
  Filled 2023-01-25 (×12): qty 1

## 2023-01-25 MED ORDER — POTASSIUM CHLORIDE CRYS ER 20 MEQ PO TBCR
40.0000 meq | EXTENDED_RELEASE_TABLET | Freq: Two times a day (BID) | ORAL | Status: AC
  Administered 2023-01-25 (×2): 40 meq via ORAL
  Filled 2023-01-25 (×2): qty 2

## 2023-01-25 MED ORDER — OXYCODONE HCL 5 MG PO TABS
5.0000 mg | ORAL_TABLET | Freq: Four times a day (QID) | ORAL | Status: AC | PRN
  Administered 2023-01-25 – 2023-01-27 (×4): 5 mg via ORAL
  Filled 2023-01-25 (×4): qty 1

## 2023-01-25 MED ORDER — METOPROLOL SUCCINATE ER 25 MG PO TB24
25.0000 mg | ORAL_TABLET | Freq: Once | ORAL | Status: AC
  Administered 2023-01-25: 25 mg via ORAL
  Filled 2023-01-25: qty 1

## 2023-01-25 NOTE — Progress Notes (Addendum)
 Rounding Note    Patient Name: Lawrence [name] Date of Encounter: 01/25/2023  [facility] Cardiologist: Traci Turner, MD   Subjective   No acute overnight events. He is feeling okay this morning. He feels like he is breathing better. No recurrent chest pain. His mainly complaint is a burning pain in his toes which sounds like peripheral neuropathy and a sharp pain in his right leg which he has had before. Recommend he talk to the Internal Medicine team about this.  Inpatient Medications    Scheduled Meds:  allopurinol  200 mg Oral BID   ARIPiprazole  5 mg Oral QHS   atorvastatin  40 mg Oral Daily   busPIRone  10 mg Oral TID   dapagliflozin propanediol  10 mg Oral Q breakfast   diltiazem  120 mg Oral Daily   fenofibrate  160 mg Oral Daily   fluticasone furoate-vilanterol  1 puff Inhalation Daily   And   umeclidinium bromide  1 puff Inhalation Daily   furosemide  80 mg Intravenous BID   insulin aspart  0-15 Units Subcutaneous TID WC   insulin aspart  0-5 Units Subcutaneous QHS   insulin glargine-yfgn  30 Units Subcutaneous Daily   lacosamide  150 mg Oral BID   levETIRAcetam  500 mg Oral BID   metolazone  2.5 mg Oral Q Tue   metoprolol succinate  25 mg Oral Daily   mirtazapine  15 mg Oral QHS   prazosin  2 mg Oral QHS   pregabalin  150 mg Oral TID   sodium chloride flush  3 mL Intravenous Q12H   spironolactone  12.5 mg Oral Daily   traZODone  200 mg Oral QHS   venlafaxine XR  225 mg Oral Q breakfast   Continuous Infusions:  PRN Meds: acetaminophen **OR** acetaminophen, albuterol, melatonin, ondansetron (ZOFRAN) IV   Vital Signs    Vitals:   01/25/23 0023 01/25/23 0300 01/25/23 0732 01/25/23 0736  BP:  104/86  106/61  Pulse: 92 86 96 98  Resp:  18 20 20  Temp:  97.7 F (36.5 C)  98 F (36.7 C)  TempSrc:  Oral  Oral  SpO2: (!) 89% 90% 92% 92%  Weight:  (!) 164.2 kg    Height:        Intake/Output Summary (Last 24 hours) at 01/25/2023 0747 Last  data filed at 01/25/2023 0400 Gross per 24 hour  Intake 603 ml  Output 3500 ml  Net -2897 ml      01/25/2023    3:00 AM 01/24/2023    3:45 AM 01/22/2023   10:11 PM  Last 3 Weights  Weight (lbs) 361 lb 14.4 oz 368 lb 9.6 oz 360 lb  Weight (kg) 164.157 kg 167.196 kg 163.295 kg      Telemetry    Sinus rhythm with rates in the 80s to low 100s. Frequent PVCs. - Personally Reviewed  ECG    No new ECG tracing today. - Personally Reviewed  Physical Exam   GEN: Morbidly obese Caucasian male resting comfortably in no acute distress.   Neck: JVD difficult to assess due to body habitus.  Cardiac: Irregular rhythm (due to frequent ectopy) and borderline tachycardia. II/VI systolic murmur. No rubs or gallops.  Respiratory: No increased work of breathing. Clear to auscultation bilaterally. No wheezes, rhonchi, or rales.  GI: Soft, obese, and non-tender.  MS: No lower extremity edema. No deformity. Skin: Hyperpigmentation of bilateral lower extremities consistent with chronic venous insufficiency. Neuro:    No focal deficits.  Psych: Normal affect. Responds appropriately.  Labs    High Sensitivity Troponin:   Recent Labs  Lab 01/22/23 2054 01/22/23 2348  TROPONINIHS 8 8     Chemistry Recent Labs  Lab 01/23/23 0538 01/24/23 0220 01/25/23 0023  NA 139 139 139  K 3.3* 3.1* 3.7  CL 99 97* 97*  CO2 30 28 32  GLUCOSE 92 154* 125*  BUN 28* 17 19  CREATININE 0.90 0.77 0.85  CALCIUM 8.4* 8.9 9.0  MG 2.0  --   --   PROT 6.5  --   --   ALBUMIN 3.2*  --   --   AST 14*  --   --   ALT 19  --   --   ALKPHOS 72  --   --   BILITOT 0.5  --   --   GFRNONAA >60 >60 >60  ANIONGAP 10 14 10    Lipids No results for input(s): "CHOL", "TRIG", "HDL", "LABVLDL", "LDLCALC", "CHOLHDL" in the last 168 hours.  Hematology Recent Labs  Lab 01/22/23 2054 01/23/23 0538 01/24/23 0220  WBC 13.0* 11.8* 9.6  RBC 4.80 4.59 4.57  HGB 14.1 13.5 13.4  HCT 44.2 41.9 41.6  MCV 92.1 91.3 91.0  MCH 29.4  29.4 29.3  MCHC 31.9 32.2 32.2  RDW 15.6* 15.7* 15.4  PLT 269 260 242   Thyroid No results for input(s): "TSH", "FREET4" in the last 168 hours.  BNP Recent Labs  Lab 01/22/23 2054  BNP 25.3    DDimer No results for input(s): "DDIMER" in the last 168 hours.   Radiology    No results found.  Cardiac Studies   Echocardiogram 12/22/2022: Impressions: 1. Left ventricular ejection fraction, by estimation, is 60 to 65%. The  left ventricle has normal function. The left ventricle has no regional  wall motion abnormalities. There is mild left ventricular hypertrophy.  Left ventricular diastolic parameters  were normal.   2. Right ventricular systolic function is normal. The right ventricular  size is normal.   3. Left atrial size was moderately dilated.   4. The mitral valve is abnormal. No evidence of mitral valve  regurgitation. No evidence of mitral stenosis.   5. AS has progressed since TTE done 05/2022. The aortic valve was not  well visualized. There is moderate calcification of the aortic valve.  There is moderate thickening of the aortic valve. Aortic valve  regurgitation is not visualized. Severe aortic  valve stenosis.   6. Aortic dilatation noted. There is mild dilatation of the aortic root,  measuring 38 mm. There is mild dilatation of the ascending aorta,  measuring 40 mm.   7. The inferior vena cava is normal in size with greater than 50%  respiratory variability, suggesting right atrial pressure of 3 mmHg.   Patient Profile     67 y.o. male with a history of non-obstructive CAD noted on cardiac catheterization in 2019, chronic diastolic CHF, paroxysmal atrial fibrillation on Eliquis, severe aortic stenosis noted on recent Echo in 12/2022, hypertension, hyperlipidemia, type 2 diabetes mellitus with peripheral neuropathy, COPD, obstructive sleep apnea on CPAP, seizure disorder, and prior tobacco use (quit about 6 years ago) who is being seen 01/24/2023 for the evaluation  of CHF at the request of Dr. Rondell Smith.   Assessment & Plan    Acute Hypoxic Respiratory Failure  Acute on Chronic Diastolic CHF Patient presented with shortness of breath and chest pain. BNP normal at 25 but this could be   falsely low in setting of obesity (up slightly from 19.5 on 01/12/2023). Chest x-ray and CTA showed no overt edema. Recent Echo on 12/22/2022 showed 60-65% with normal wall motion and diastolic parameters but progression of known aortic stenosis to the severe range. Cardiomems on 01/16/2023 was mildly elevated suggestive of fluid accumulation. He was started on IV Lasix and received home dose of Metolazone (which he takes once a week) on 6/18 with good urinary response. Net negative 6.4 L this admission. Weight down 7 lbs from yesterday. Renal function stable.  - Volume status is difficult to assess due to body habitus. - Continue IV Lasix 80mg twice daily given renal function is tolerating this well.  - Continue Spironolactone 12.5mg daily. - Continue Farxiga 10mg daily. - Will increase Toprol-XL to 50mg daily for additional heart rate control. He has already gotten 25mg this morning so will give another 25mg now and then start 50mg tomorrow. - He was initially scheduled to have a R/LHC next week for further evaluation of his aortic stenosis. We will go ahead and do this here. Last dose of Eliquis was yesterday morning so cath is scheduled for tomorrow.   The patient understands that risks include but are not limited to stroke (1 in 1000), death (1 in 1000), kidney failure [usually temporary] (1 in 500), bleeding (1 in 200), allergic reaction [possibly serious] (1 in 200), and agrees to proceed.    Chest Pain Non-Obstructive CAD LHC in 2019 showed non-obstructive CAD. Myoview in 2022 was negative. He presented with some sharp chest and shoulder pain. EKG showed no acute changes and high-sensitivity troponin negative x2. - Currently chest pain free. - Chest pain may just be due  to volume overloaded. However, plans is for R/LHC as above.   Paroxysmal Atrial Fibrillation Frequent PVCs Maintaining sinus rhythm but having frequent PVCs on telemetry. - Potassium 3.7 today. - Magnesium 2.0 on 6/17. Will repeat today. - Continue Diltiazem 120mg daily and Toprol-XL 25mg daily. - Currently on anticoagulation with Eliquis 5mg twice daily. Last dose was this morning. Will hold in anticipation for R/LHC.   Severe Aortic Stenosis Recent Echo in 12/2022 showed severe aortic stenosis with mean gradient 40.0 mmHg and AVA by VTI 1.03 cm2. - Plan is for R/LHC tomorrow as above.   Hypertension BP soft but table. - Current medications: Spironolactone 12.5mg daily, Diltiazem 120mg daily, and Toprol-XL 25mg daily. Will increase Toprol-XL to 50mg daily for additional heart rate control. Otherwise, continue current medications.   - Also on Losartan at home but this is currently on hold. Can restart after cardiac catheterization if BP allows.   Hyperlipidemia - Continue Lipitor 40mg daily.   Hypokalemia Potassium 3.1 yesterday. Supplemented and improved to 3.7 today.  - Continue KCl 40 mEq while being diuresed with IV Lasix.  - Continue to monitor closely.   Otherwise, per primary team: - COPD - Type 2 diabetes mellitus  - Seizure disorder - Anxiety and depression  For questions or updates, please contact [facility] Please consult [email or web address] for contact info under        Signed, Callie E Goodrich, PA-C  01/25/2023, 7:47 AM    Personally seen and examined. Agree with above.  66-year-old here with severe aortic stenosis acute on chronic diastolic heart failure acute hypoxic respiratory failure nonobstructive CAD  -Plan for right and left heart catheterization tomorrow.  -Continuing IV Lasix 80 mg twice a day and watching renal function, continue spironolactone, continue Farxiga.  Increasing Toprol to 50 mg.    Hafsah Hendler, MD  

## 2023-01-25 NOTE — H&P (View-Only) (Signed)
Rounding Note    Patient Name: Lawrence [name] Date of Encounter: 01/25/2023  [facility] Cardiologist: Armanda Magic, MD   Subjective   No acute overnight events. He is feeling okay this morning. He feels like he is breathing better. No recurrent chest pain. His mainly complaint is a burning pain in his toes which sounds like peripheral neuropathy and a sharp pain in his right leg which he has had before. Recommend he talk to the Internal Medicine team about this.  Inpatient Medications    Scheduled Meds:  allopurinol  200 mg Oral BID   ARIPiprazole  5 mg Oral QHS   atorvastatin  40 mg Oral Daily   busPIRone  10 mg Oral TID   dapagliflozin propanediol  10 mg Oral Q breakfast   diltiazem  120 mg Oral Daily   fenofibrate  160 mg Oral Daily   fluticasone furoate-vilanterol  1 puff Inhalation Daily   And   umeclidinium bromide  1 puff Inhalation Daily   furosemide  80 mg Intravenous BID   insulin aspart  0-15 Units Subcutaneous TID WC   insulin aspart  0-5 Units Subcutaneous QHS   insulin glargine-yfgn  30 Units Subcutaneous Daily   lacosamide  150 mg Oral BID   levETIRAcetam  500 mg Oral BID   metolazone  2.5 mg Oral Q Tue   metoprolol succinate  25 mg Oral Daily   mirtazapine  15 mg Oral QHS   prazosin  2 mg Oral QHS   pregabalin  150 mg Oral TID   sodium chloride flush  3 mL Intravenous Q12H   spironolactone  12.5 mg Oral Daily   traZODone  200 mg Oral QHS   venlafaxine XR  225 mg Oral Q breakfast   Continuous Infusions:  PRN Meds: acetaminophen **OR** acetaminophen, albuterol, melatonin, ondansetron (ZOFRAN) IV   Vital Signs    Vitals:   01/25/23 0023 01/25/23 0300 01/25/23 0732 01/25/23 0736  BP:  104/86  106/61  Pulse: 92 86 96 98  Resp:  18 20 20   Temp:  97.7 F (36.5 C)  98 F (36.7 C)  TempSrc:  Oral  Oral  SpO2: (!) 89% 90% 92% 92%  Weight:  (!) 164.2 kg    Height:        Intake/Output Summary (Last 24 hours) at 01/25/2023 0747 Last  data filed at 01/25/2023 0400 Gross per 24 hour  Intake 603 ml  Output 3500 ml  Net -2897 ml      01/25/2023    3:00 AM 01/24/2023    3:45 AM 01/22/2023   10:11 PM  Last 3 Weights  Weight (lbs) 361 lb 14.4 oz 368 lb 9.6 oz 360 lb  Weight (kg) 164.157 kg 167.196 kg 163.295 kg      Telemetry    Sinus rhythm with rates in the 80s to low 100s. Frequent PVCs. - Personally Reviewed  ECG    No new ECG tracing today. - Personally Reviewed  Physical Exam   GEN: Morbidly obese Caucasian male resting comfortably in no acute distress.   Neck: JVD difficult to assess due to body habitus.  Cardiac: Irregular rhythm (due to frequent ectopy) and borderline tachycardia. II/VI systolic murmur. No rubs or gallops.  Respiratory: No increased work of breathing. Clear to auscultation bilaterally. No wheezes, rhonchi, or rales.  GI: Soft, obese, and non-tender.  MS: No lower extremity edema. No deformity. Skin: Hyperpigmentation of bilateral lower extremities consistent with chronic venous insufficiency. Neuro:  No focal deficits.  Psych: Normal affect. Responds appropriately.  Labs    High Sensitivity Troponin:   Recent Labs  Lab 01/22/23 2054 01/22/23 2348  TROPONINIHS 8 8     Chemistry Recent Labs  Lab 01/23/23 0538 01/24/23 0220 01/25/23 0023  NA 139 139 139  K 3.3* 3.1* 3.7  CL 99 97* 97*  CO2 30 28 32  GLUCOSE 92 154* 125*  BUN 28* 17 19  CREATININE 0.90 0.77 0.85  CALCIUM 8.4* 8.9 9.0  MG 2.0  --   --   PROT 6.5  --   --   ALBUMIN 3.2*  --   --   AST 14*  --   --   ALT 19  --   --   ALKPHOS 72  --   --   BILITOT 0.5  --   --   GFRNONAA >60 >60 >60  ANIONGAP 10 14 10     Lipids No results for input(s): "CHOL", "TRIG", "HDL", "LABVLDL", "LDLCALC", "CHOLHDL" in the last 168 hours.  Hematology Recent Labs  Lab 01/22/23 2054 01/23/23 0538 01/24/23 0220  WBC 13.0* 11.8* 9.6  RBC 4.80 4.59 4.57  HGB 14.1 13.5 13.4  HCT 44.2 41.9 41.6  MCV 92.1 91.3 91.0  MCH 29.4  29.4 29.3  MCHC 31.9 32.2 32.2  RDW 15.6* 15.7* 15.4  PLT 269 260 242   Thyroid No results for input(s): "TSH", "FREET4" in the last 168 hours.  BNP Recent Labs  Lab 01/22/23 2054  BNP 25.3    DDimer No results for input(s): "DDIMER" in the last 168 hours.   Radiology    No results found.  Cardiac Studies   Echocardiogram 12/22/2022: Impressions: 1. Left ventricular ejection fraction, by estimation, is 60 to 65%. The  left ventricle has normal function. The left ventricle has no regional  wall motion abnormalities. There is mild left ventricular hypertrophy.  Left ventricular diastolic parameters  were normal.   2. Right ventricular systolic function is normal. The right ventricular  size is normal.   3. Left atrial size was moderately dilated.   4. The mitral valve is abnormal. No evidence of mitral valve  regurgitation. No evidence of mitral stenosis.   5. AS has progressed since TTE done 05/2022. The aortic valve was not  well visualized. There is moderate calcification of the aortic valve.  There is moderate thickening of the aortic valve. Aortic valve  regurgitation is not visualized. Severe aortic  valve stenosis.   6. Aortic dilatation noted. There is mild dilatation of the aortic root,  measuring 38 mm. There is mild dilatation of the ascending aorta,  measuring 40 mm.   7. The inferior vena cava is normal in size with greater than 50%  respiratory variability, suggesting right atrial pressure of 3 mmHg.   Patient Profile     67 y.o. male with a history of non-obstructive CAD noted on cardiac catheterization in 2019, chronic diastolic CHF, paroxysmal atrial fibrillation on Eliquis, severe aortic stenosis noted on recent Echo in 12/2022, hypertension, hyperlipidemia, type 2 diabetes mellitus with peripheral neuropathy, COPD, obstructive sleep apnea on CPAP, seizure disorder, and prior tobacco use (quit about 6 years ago) who is being seen 01/24/2023 for the evaluation  of CHF at the request of Dr. Madelyn Flavors.   Assessment & Plan    Acute Hypoxic Respiratory Failure  Acute on Chronic Diastolic CHF Patient presented with shortness of breath and chest pain. BNP normal at 25 but this could be  falsely low in setting of obesity (up slightly from 19.5 on 01/12/2023). Chest x-ray and CTA showed no overt edema. Recent Echo on 12/22/2022 showed 60-65% with normal wall motion and diastolic parameters but progression of known aortic stenosis to the severe range. Cardiomems on 01/16/2023 was mildly elevated suggestive of fluid accumulation. He was started on IV Lasix and received home dose of Metolazone (which he takes once a week) on 6/18 with good urinary response. Net negative 6.4 L this admission. Weight down 7 lbs from yesterday. Renal function stable.  - Volume status is difficult to assess due to body habitus. - Continue IV Lasix 80mg  twice daily given renal function is tolerating this well.  - Continue Spironolactone 12.5mg  daily. - Continue Farxiga 10mg  daily. - Will increase Toprol-XL to 50mg  daily for additional heart rate control. He has already gotten 25mg  this morning so will give another 25mg  now and then start 50mg  tomorrow. - He was initially scheduled to have a R/LHC next week for further evaluation of his aortic stenosis. We will go ahead and do this here. Last dose of Eliquis was yesterday morning so cath is scheduled for tomorrow.   The patient understands that risks include but are not limited to stroke (1 in 1000), death (1 in 1000), kidney failure [usually temporary] (1 in 500), bleeding (1 in 200), allergic reaction [possibly serious] (1 in 200), and agrees to proceed.    Chest Pain Non-Obstructive CAD LHC in 2019 showed non-obstructive CAD. Myoview in 2022 was negative. He presented with some sharp chest and shoulder pain. EKG showed no acute changes and high-sensitivity troponin negative x2. - Currently chest pain free. - Chest pain may just be due  to volume overloaded. However, plans is for [facility] as above.   Paroxysmal Atrial Fibrillation Frequent PVCs Maintaining sinus rhythm but having frequent PVCs on telemetry. - Potassium 3.7 today. - Magnesium 2.0 on 6/17. Will repeat today. - Continue Diltiazem 120mg  daily and Toprol-XL 25mg  daily. - Currently on anticoagulation with Eliquis 5mg  twice daily. Last dose was this morning. Will hold in anticipation for [facility].   Severe Aortic Stenosis Recent Echo in 12/2022 showed severe aortic stenosis with mean gradient 40.0 mmHg and AVA by VTI 1.03 cm2. - Plan is for [facility] tomorrow as above.   Hypertension BP soft but table. - Current medications: Spironolactone 12.5mg  daily, Diltiazem 120mg  daily, and Toprol-XL 25mg  daily. Will increase Toprol-XL to 50mg  daily for additional heart rate control. Otherwise, continue current medications.   - Also on Losartan at home but this is currently on hold. Can restart after cardiac catheterization if BP allows.   Hyperlipidemia - Continue Lipitor 40mg  daily.   Hypokalemia Potassium 3.1 yesterday. Supplemented and improved to 3.7 today.  - Continue KCl 40 mEq while being diuresed with IV Lasix.  - Continue to monitor closely.   Otherwise, per primary team: - COPD - Type 2 diabetes mellitus  - Seizure disorder - Anxiety and depression  For questions or updates, please contact [facility] Please consult [email or web address] for contact info under        Signed, Corrin Parker, PA-C  01/25/2023, 7:47 AM    Personally seen and examined. Agree with above.  67 year old here with severe aortic stenosis acute on chronic diastolic heart failure acute hypoxic respiratory failure nonobstructive CAD  -Plan for right and left heart catheterization tomorrow.  -Continuing IV Lasix 80 mg twice a day and watching renal function, continue spironolactone, continue [place].  Increasing Toprol to 50 mg.  Candee Furbish, MD

## 2023-01-25 NOTE — Evaluation (Signed)
Occupational Therapy Evaluation Patient Details Name: Lawrence [name] MRN: [id number] DOB: [birth year] Today's Date: 01/25/2023   History of Present Illness Pt is a 67 y/o male who presents 01/22/23 with SOB and intermittent L sided CP for ~2 weeks PTA. R/L heart cath scheduled for 6/20 while inpatient. PMH significant for HTN, CHF, PAF on Eliquis, aortic stenosis, CAD, CVA, DM II, COPD, AAA.   Clinical Impression   Pt reports having assist with ADLs at baseline, uses rollator for mobility. Pt lives with spouse and has an aide that comes 13/hrs/week  to assist with ADLs/IADLs. Pt currently needing set up-mod A for ADLs, min guard for bed mobility and mod A for transfers with RW. Pt with 2/4 DOE after short distance ambulation in room, VSS on 2L O2 throughout. Pt presenting with impairments listed below, will follow acutely. Recommend HHOT at d/c.      Recommendations for follow up therapy are one component of a multi-disciplinary discharge planning process, led by the attending physician.  Recommendations may be updated based on patient status, additional functional criteria and insurance authorization.   Assistance Recommended at Discharge Intermittent Supervision/Assistance  Patient can return home with the following A little help with walking and/or transfers;A lot of help with bathing/dressing/bathroom;Assistance with cooking/housework;Direct supervision/assist for medications management;Direct supervision/assist for financial management;Assist for transportation;Help with stairs or ramp for entrance    Functional Status Assessment  Patient has had a recent decline in their functional status and demonstrates the ability to make significant improvements in function in a reasonable and predictable amount of time.  Equipment Recommendations  None recommended by OT (pt has all needed DME)    Recommendations for Other Services PT consult     Precautions / Restrictions  Precautions Precautions: Fall Restrictions Weight Bearing Restrictions: No      Mobility Bed Mobility Overal bed mobility: Needs Assistance Bed Mobility: Sit to Supine       Sit to supine: Min guard   General bed mobility comments: OOB in recliner upon arrival    Transfers Overall transfer level: Needs assistance Equipment used: Rolling walker (2 wheels) Transfers: Sit to/from Stand Sit to Stand: Mod assist                  Balance Overall balance assessment: Needs assistance Sitting-balance support: No upper extremity supported, Feet supported Sitting balance-Leahy Scale: Fair     Standing balance support: Bilateral upper extremity supported, During functional activity, Reliant on assistive device for balance Standing balance-Leahy Scale: Poor Standing balance comment: reliant on RW support                           ADL either performed or assessed with clinical judgement   ADL Overall ADL's : Needs assistance/impaired Eating/Feeding: Set up;Sitting   Grooming: Min guard;Sitting;Standing   Upper Body Bathing: Minimal assistance;Sitting   Lower Body Bathing: Moderate assistance;Sitting/lateral leans   Upper Body Dressing : Minimal assistance;Sitting   Lower Body Dressing: Moderate assistance;Sitting/lateral leans   Toilet Transfer: Min guard;Rolling walker (2 wheels)   Toileting- Clothing Manipulation and Hygiene: Min guard       Functional mobility during ADLs: Min guard;Rolling walker (2 wheels)       Vision Baseline Vision/History: 1 Wears glasses Vision Assessment?: No apparent visual deficits     Perception Perception Perception Tested?: No   Praxis Praxis Praxis tested?: Not tested    Pertinent Vitals/Pain Pain Assessment Pain Assessment: No/denies pain  Hand Dominance Right   Extremity/Trunk Assessment Upper Extremity Assessment Upper Extremity Assessment: Overall WFL for tasks assessed   Lower Extremity  Assessment Lower Extremity Assessment: Defer to PT evaluation   Cervical / Trunk Assessment Cervical / Trunk Assessment: Other exceptions Cervical / Trunk Exceptions: Forward head posture with rounded shoulders. Increased body habitus due to morbid obesity.   Communication Communication Communication: No difficulties   Cognition Arousal/Alertness: Awake/alert Behavior During Therapy: WFL for tasks assessed/performed Overall Cognitive Status: Within Functional Limits for tasks assessed                                       General Comments  VSS on 2L O2    Exercises     Shoulder Instructions      Home Living Family/patient expects to be discharged to:: Private residence Living Arrangements: Spouse/significant other Available Help at Discharge: Family;Available 24 hours/day;Personal care attendant Type of Home: Apartment Home Access: Level entry     Home Layout: One level     Bathroom Shower/Tub: Sponge bathes at baseline   Bathroom Toilet: Standard Bathroom Accessibility: No   Home Equipment: Rollator (4 wheels);Electric scooter;BSC/3in1   Additional Comments: Has a PCA 4 days a week for about 4 hours a day (13 hour week); wears CPAP at night, no O2 at home      Prior Functioning/Environment Prior Level of Function : Needs assist;Driving             Mobility Comments: Rollator for household mobility (short distances); electric scooter in community dwellings. ADLs Comments: aide assists with bathing/dressing, provides set up A for grooming tasks, light household chores,  utilizes transportation services. uses BSC does not go into bathroom        OT Problem List: Decreased activity tolerance;Decreased strength;Decreased range of motion;Decreased safety awareness;Impaired balance (sitting and/or standing);Cardiopulmonary status limiting activity      OT Treatment/Interventions: Self-care/ADL training;Therapeutic exercise;Energy conservation;DME  and/or AE instruction;Therapeutic activities;Patient/family education;Balance training    OT Goals(Current goals can be found in the care plan section) Acute Rehab OT Goals Patient Stated Goal: none stated OT Goal Formulation: With patient Time For Goal Achievement: 02/08/23 Potential to Achieve Goals: Good ADL Goals Pt Will Perform Upper Body Dressing: with min guard assist;sitting Pt Will Perform Lower Body Dressing: with min guard assist;sitting/lateral leans;sit to/from stand Pt Will Transfer to Toilet: with min guard assist;ambulating;regular height toilet Additional ADL Goal #1: pt will stand x5 min for functional task in prep for ADLs  OT Frequency: Min 1X/week    Co-evaluation              AM-PAC OT "6 Clicks" Daily Activity     Outcome Measure Help from another person eating meals?: None Help from another person taking care of personal grooming?: A Little Help from another person toileting, which includes using toliet, bedpan, or urinal?: A Lot Help from another person bathing (including washing, rinsing, drying)?: A Lot Help from another person to put on and taking off regular upper body clothing?: A Lot Help from another person to put on and taking off regular lower body clothing?: A Lot 6 Click Score: 15   End of Session Equipment Utilized During Treatment: Gait belt;Rolling walker (2 wheels) Nurse Communication: Mobility status  Activity Tolerance: Patient tolerated treatment well Patient left: in bed;with call bell/phone within reach;with bed alarm set  OT Visit Diagnosis: Other abnormalities of gait and mobility (  R26.89);Unsteadiness on feet (R26.81);Muscle weakness (generalized) (M62.81)                Time: 1610-9604 OT Time Calculation (min): 22 min Charges:  OT General Charges $OT Visit: 1 Visit OT Evaluation $OT Eval Moderate Complexity: 1 Mod  Jens Siems K, OTD, OTR/L SecureChat Preferred Acute Rehab [phone]   Carver Fila Koonce 01/25/2023,  3:26 PM

## 2023-01-25 NOTE — Plan of Care (Signed)
Problem: Education: Goal: Knowledge of General Education information will improve Description: Including pain rating scale, medication(s)/side effects and non-pharmacologic comfort measures 01/25/2023 2254 by Royetta Crochet, RN Outcome: Progressing 01/25/2023 2254 by Royetta Crochet, RN Outcome: Progressing   Problem: Health Behavior/Discharge Planning: Goal: Ability to manage health-related needs will improve 01/25/2023 2254 by Royetta Crochet, RN Outcome: Progressing 01/25/2023 2254 by Royetta Crochet, RN Outcome: Progressing   Problem: Clinical Measurements: Goal: Ability to maintain clinical measurements within normal limits will improve 01/25/2023 2254 by Royetta Crochet, RN Outcome: Progressing 01/25/2023 2254 by Royetta Crochet, RN Outcome: Progressing Goal: Will remain free from infection 01/25/2023 2254 by Royetta Crochet, RN Outcome: Progressing 01/25/2023 2254 by Royetta Crochet, RN Outcome: Progressing Goal: Diagnostic test results will improve 01/25/2023 2254 by Royetta Crochet, RN Outcome: Progressing 01/25/2023 2254 by Royetta Crochet, RN Outcome: Progressing Goal: Respiratory complications will improve 01/25/2023 2254 by Royetta Crochet, RN Outcome: Progressing 01/25/2023 2254 by Royetta Crochet, RN Outcome: Progressing Goal: Cardiovascular complication will be avoided 01/25/2023 2254 by Royetta Crochet, RN Outcome: Progressing 01/25/2023 2254 by Royetta Crochet, RN Outcome: Progressing   Problem: Activity: Goal: Risk for activity intolerance will decrease 01/25/2023 2254 by Royetta Crochet, RN Outcome: Progressing 01/25/2023 2254 by Royetta Crochet, RN Outcome: Progressing   Problem: Nutrition: Goal: Adequate nutrition will be maintained 01/25/2023 2254 by Royetta Crochet, RN Outcome: Progressing 01/25/2023 2254 by Royetta Crochet, RN Outcome: Progressing   Problem: Coping: Goal: Level of anxiety will decrease 01/25/2023 2254 by Royetta Crochet, RN Outcome: Progressing 01/25/2023 2254 by Royetta Crochet, RN Outcome: Progressing   Problem: Elimination: Goal: Will not experience complications related to bowel motility 01/25/2023 2254 by Royetta Crochet, RN Outcome: Progressing 01/25/2023 2254 by Royetta Crochet, RN Outcome: Progressing Goal: Will not experience complications related to urinary retention 01/25/2023 2254 by Royetta Crochet, RN Outcome: Progressing 01/25/2023 2254 by Royetta Crochet, RN Outcome: Progressing   Problem: Pain Managment: Goal: General experience of comfort will improve 01/25/2023 2254 by Royetta Crochet, RN Outcome: Progressing 01/25/2023 2254 by Royetta Crochet, RN Outcome: Progressing   Problem: Safety: Goal: Ability to remain free from injury will improve 01/25/2023 2254 by Royetta Crochet, RN Outcome: Progressing 01/25/2023 2254 by Royetta Crochet, RN Outcome: Progressing   Problem: Skin Integrity: Goal: Risk for impaired skin integrity will decrease 01/25/2023 2254 by Royetta Crochet, RN Outcome: Progressing 01/25/2023 2254 by Royetta Crochet, RN Outcome: Progressing   Problem: Education: Goal: Ability to describe self-care measures that may prevent or decrease complications (Diabetes Survival Skills Education) will improve 01/25/2023 2254 by Royetta Crochet, RN Outcome: Progressing 01/25/2023 2254 by Royetta Crochet, RN Outcome: Progressing Goal: Individualized Educational Video(s) 01/25/2023 2254 by Royetta Crochet, RN Outcome: Progressing 01/25/2023 2254 by Royetta Crochet, RN Outcome: Progressing   Problem: Coping: Goal: Ability to adjust to condition or change in health will improve 01/25/2023 2254 by Royetta Crochet, RN Outcome: Progressing 01/25/2023 2254 by Royetta Crochet, RN Outcome: Progressing   Problem: Fluid Volume: Goal: Ability to maintain a balanced intake and output will improve 01/25/2023 2254 by Royetta Crochet, RN Outcome:  Progressing 01/25/2023 2254 by Royetta Crochet, RN Outcome: Progressing   Problem: Health Behavior/Discharge Planning: Goal: Ability to identify and utilize available resources and services will improve 01/25/2023 2254 by Royetta Crochet, RN Outcome: Progressing 01/25/2023 2254 by Royetta Crochet,  RN Outcome: Progressing Goal: Ability to manage health-related needs will improve 01/25/2023 2254 by Royetta Crochet, RN Outcome: Progressing 01/25/2023 2254 by Royetta Crochet, RN Outcome: Progressing   Problem: Metabolic: Goal: Ability to maintain appropriate glucose levels will improve 01/25/2023 2254 by Royetta Crochet, RN Outcome: Progressing 01/25/2023 2254 by Royetta Crochet, RN Outcome: Progressing   Problem: Nutritional: Goal: Maintenance of adequate nutrition will improve 01/25/2023 2254 by Royetta Crochet, RN Outcome: Progressing 01/25/2023 2254 by Royetta Crochet, RN Outcome: Progressing Goal: Progress toward achieving an optimal weight will improve 01/25/2023 2254 by Royetta Crochet, RN Outcome: Progressing 01/25/2023 2254 by Royetta Crochet, RN Outcome: Progressing   Problem: Skin Integrity: Goal: Risk for impaired skin integrity will decrease 01/25/2023 2254 by Royetta Crochet, RN Outcome: Progressing 01/25/2023 2254 by Royetta Crochet, RN Outcome: Progressing   Problem: Tissue Perfusion: Goal: Adequacy of tissue perfusion will improve 01/25/2023 2254 by Royetta Crochet, RN Outcome: Progressing 01/25/2023 2254 by Royetta Crochet, RN Outcome: Progressing   Problem: Education: Goal: Understanding of CV disease, CV risk reduction, and recovery process will improve 01/25/2023 2254 by Royetta Crochet, RN Outcome: Progressing 01/25/2023 2254 by Royetta Crochet, RN Outcome: Progressing Goal: Individualized Educational Video(s) 01/25/2023 2254 by Royetta Crochet, RN Outcome: Progressing 01/25/2023 2254 by Royetta Crochet, RN Outcome: Progressing   Problem:  Activity: Goal: Ability to return to baseline activity level will improve 01/25/2023 2254 by Royetta Crochet, RN Outcome: Progressing 01/25/2023 2254 by Royetta Crochet, RN Outcome: Progressing   Problem: Cardiovascular: Goal: Ability to achieve and maintain adequate cardiovascular perfusion will improve Outcome: Progressing Goal: Vascular access site(s) Level 0-1 will be maintained Outcome: Progressing   Problem: Health Behavior/Discharge Planning: Goal: Ability to safely manage health-related needs after discharge will improve Outcome: Progressing

## 2023-01-25 NOTE — Progress Notes (Signed)
PROGRESS NOTE Lawrence [name]  ZOX:[id number] DOB: 06-Oct-1955 DOA: 01/22/2023 PCP: [name], PA-C   Brief Narrative/Hospital Course: 67 y.o.m w/ hypertension,CHF, PAF on Eliquis, aortic stenosis, CAD, CVA, diabetes mellitus type 2, COPD, AAA, prior history of tobacco abuse, morbid obesity, and OSA on CPAP presented with complaints of shortness of breath. He was intermittently having left-sided chest pain for the last 2 weeks, radiating into his neck and shoulder, recently had an echocardiogram done on 5/16 which noted EF to be 60 -65% with normal diastolic parameters.  Plan was for patient to have a right and left heart cath on the June 25.He had also may notice some mild redness of his lower extremities.    In ED: afebrile with pulse 89-111, RR 16-31,BP 91/47-130/112, O2 saturations noted to be as low as 89% with improvement ON 4 L of [place],Labs significant for WBC 13 >-11.8, potassium 3.3, BUN 24-> 28, creatinine 1->0.9, calcium 8.6->.8.4, BNP 25.3, high-sensitivity troponin negative x 2.  Cxr-no acute abnormality.  CT angiogram of the chest have been obtained which showed no evidence of a acute arterial embolic filling defect, but did note chronic linear calcified thrombus in the segmental artery of the basal left lower lobe. Patient was admitted for acute respiratory hypoxia in the setting of COPD exacerbation ABG showed no CO2 retention but said pCO2 in 70s  Subjective: Patient denies any chest pain.  Shortness of breath seems to be improving.  He has urinated a lot in the last 24 hours.  Assessment and Plan:   Acute respiratory failure with hypoxia and hypercapnia/dyspnea on exertion/Chest pain: Patient requiring 4 L of oxygen by nasal cannula.  Typically does not use oxygen at home. Symptoms are likely due to acute diastolic CHF.  Patient also with severe aortic stenosis. CT angiogram did not show any PE COVID-19 and respiratory viral panel were negative. He is on his home inhaler  treatments as well. Currently on furosemide 80 mg twice a day. Oxygen requirement slightly better compared to yesterday.  Continue to monitor closely. Plan is for cardiac catheterization today.  Acute on chronic Diastolic congestive heart failure Echocardiogram from May showed EF to be 60 to 65%.  Likely has diastolic dysfunction. Noted to be fluid overloaded.  Remains on IV furosemide. Patient also on spironolactone, metolazone. Strict ins and outs and daily weights. Cardiology is following. Left and right heart catheterization is planned for today.   Hypokalemia: Supplemented.  Noted to be normal today.  Continue to monitor daily while he is getting diuresed.  Give additional dose of potassium today.     COPD Continue home medications.   Type 2 diabetes mellitus with long-term use of insulin: HbA1c 6.4.  Continue SSI, Farxiga, glargine.     Paroxysmal atrial fibrillation  Rate controlled on Cardizem.  Eliquis on hold for cardiac catheterization.   Essential hypertension: Stable on Cardizem Minipress, Lasix.   Anxiety and depression Mood stable continue as BuSpar, Abilify, venlafaxine   Seizure disorder Continue home Keppra and lacosamide  Obstructive sleep apnea Continue CPAP  Morbid obesity Estimated body mass index is 46.47 kg/m as calculated from the following:   Height as of this encounter: 6\' 2"  (1.88 m).   Weight as of this encounter: 164.2 kg.  DVT prophylaxis: Eliquis on hold.  Currently on SCDs. Code Status: DNR Family Communication: Discussed with patient.  No family at bedside. Dispo: To be determined.  Objective:  Vitals:   01/25/23 0023 01/25/23 0300 01/25/23 0732 01/25/23 0736  BP:  104/86  106/61  Pulse: 92 86 96 98  Resp:  18 20 20   Temp:  97.7 F (36.5 C)  98 F (36.7 C)  TempSrc:  Oral  Oral  SpO2: (!) 89% 90% 92% 92%  Weight:  (!) 164.2 kg    Height:       Weight change: -3.039 kg  Physical Examination:  General appearance: Awake  alert.  In no distress Resp: Normal effort at rest.  Crackles bilateral bases.  No wheezing or rhonchi. Cardio: S1-S2 is normal regular.  No S3-S4.  No rubs murmurs or bruit GI: Abdomen is soft.  Nontender nondistended.  Bowel sounds are present normal.  No masses organomegaly Extremities: Pedal edema is noted bilateral lower extremities Neurologic: Alert and oriented x3.  No focal neurological deficits.     Medications:  Scheduled Meds:  allopurinol  200 mg Oral BID   ARIPiprazole  5 mg Oral QHS   atorvastatin  40 mg Oral Daily   busPIRone  10 mg Oral TID   dapagliflozin propanediol  10 mg Oral Q breakfast   diltiazem  120 mg Oral Daily   fenofibrate  160 mg Oral Daily   fluticasone furoate-vilanterol  1 puff Inhalation Daily   And   umeclidinium bromide  1 puff Inhalation Daily   furosemide  80 mg Intravenous BID   insulin aspart  0-15 Units Subcutaneous TID WC   insulin aspart  0-5 Units Subcutaneous QHS   insulin glargine-yfgn  30 Units Subcutaneous Daily   lacosamide  150 mg Oral BID   levETIRAcetam  500 mg Oral BID   metolazone  2.5 mg Oral Q Tue   metoprolol succinate  25 mg Oral Daily   mirtazapine  15 mg Oral QHS   prazosin  2 mg Oral QHS   pregabalin  150 mg Oral TID   sodium chloride flush  3 mL Intravenous Q12H   spironolactone  12.5 mg Oral Daily   traZODone  200 mg Oral QHS   venlafaxine XR  225 mg Oral Q breakfast  Continuous Infusions:   Diet Order             Diet heart healthy/carb modified Room service appropriate? Yes; Fluid consistency: Thin  Diet effective now                  Intake/Output Summary (Last 24 hours) at 01/25/2023 0940 Last data filed at 01/25/2023 0842 Gross per 24 hour  Intake 1083 ml  Output 4201 ml  Net -3118 ml    Net IO Since Admission: -6,615 mL [01/25/23 0940]  Wt Readings from Last 3 Encounters:  01/25/23 (!) 164.2 kg  01/19/23 (!) 163.3 kg  01/12/23 (!) 162.8 kg     Data Reviewed: I have personally reviewed  following labs and reports of imaging studies  CBC: Recent Labs  Lab 01/22/23 2054 01/23/23 0538 01/24/23 0220  WBC 13.0* 11.8* 9.6  NEUTROABS  --  8.5*  --   HGB 14.1 13.5 13.4  HCT 44.2 41.9 41.6  MCV 92.1 91.3 91.0  PLT 269 260 242    Basic Metabolic Panel: Recent Labs  Lab 01/22/23 2054 01/23/23 0538 01/24/23 0220 01/25/23 0023  NA 139 139 139 139  K 3.2* 3.3* 3.1* 3.7  CL 99 99 97* 97*  CO2 30 30 28  32  GLUCOSE 91 92 154* 125*  BUN 24* 28* 17 19  CREATININE 1.00 0.90 0.77 0.85  CALCIUM 8.6* 8.4* 8.9 9.0  MG  --  2.0  --   --   PHOS  --  3.3  --   --     GFR: Estimated Creatinine Clearance: 140.9 mL/min (by C-G formula based on SCr of 0.85 mg/dL).  Liver Function Tests: Recent Labs  Lab 01/23/23 0538  AST 14*  ALT 19  ALKPHOS 72  BILITOT 0.5  PROT 6.5  ALBUMIN 3.2*    Recent Labs  Lab 01/24/23 0753 01/24/23 1201 01/24/23 1643 01/24/23 2138 01/25/23 0735  GLUCAP 197* 132* 128* 144* 140*    Recent Results (from the past 240 hour(s))  Respiratory (~20 pathogens) panel by PCR     Status: None   Collection Time: 01/23/23  8:35 AM   Specimen: Nasopharyngeal Swab; Respiratory  Result Value Ref Range Status   Adenovirus NOT DETECTED NOT DETECTED Final   Coronavirus 229E NOT DETECTED NOT DETECTED Final    Comment: (NOTE) The Coronavirus on the Respiratory Panel, DOES NOT test for the novel  Coronavirus (2019 nCoV)    Coronavirus HKU1 NOT DETECTED NOT DETECTED Final   Coronavirus NL63 NOT DETECTED NOT DETECTED Final   Coronavirus OC43 NOT DETECTED NOT DETECTED Final   Metapneumovirus NOT DETECTED NOT DETECTED Final   Rhinovirus / Enterovirus NOT DETECTED NOT DETECTED Final   Influenza A NOT DETECTED NOT DETECTED Final   Influenza B NOT DETECTED NOT DETECTED Final   Parainfluenza Virus 1 NOT DETECTED NOT DETECTED Final   Parainfluenza Virus 2 NOT DETECTED NOT DETECTED Final   Parainfluenza Virus 3 NOT DETECTED NOT DETECTED Final    Parainfluenza Virus 4 NOT DETECTED NOT DETECTED Final   Respiratory Syncytial Virus NOT DETECTED NOT DETECTED Final   Bordetella pertussis NOT DETECTED NOT DETECTED Final   Bordetella Parapertussis NOT DETECTED NOT DETECTED Final   Chlamydophila pneumoniae NOT DETECTED NOT DETECTED Final   Mycoplasma pneumoniae NOT DETECTED NOT DETECTED Final    Comment: Performed at [facility], [address], [place], [place] [address]  SARS Coronavirus 2 by RT PCR (hospital order, performed in [facility] hospital lab) *cepheid single result test* Anterior Nasal Swab     Status: None   Collection Time: 01/23/23  8:35 AM   Specimen: Anterior Nasal Swab  Result Value Ref Range Status   SARS Coronavirus 2 by RT PCR NEGATIVE NEGATIVE Final    Comment: Performed at [facility], [address], [place], [place] [address]    Antimicrobials: Anti-infectives (From admission, onward)    Start     Dose/Rate Route Frequency Ordered Stop   01/22/23 0000  cephALEXin (KEFLEX) 500 MG capsule        500 mg Oral 4 times daily 01/22/23 2347        Culture/Microbiology    Component Value Date/Time   SDES URINE, CLEAN CATCH 08/07/2021 2100   SPECREQUEST NONE 08/07/2021 2100   CULT (A) 08/07/2021 2100    <10,000 COLONIES/mL INSIGNIFICANT GROWTH Performed at [facility], [address] [place], [place] [address]    REPTSTATUS 08/09/2021 FINAL 08/07/2021 2100   Radiology Studies: No results found.   LOS: 2 days   Osvaldo Shipper, MD [facility]  01/25/2023, 9:40 AM

## 2023-01-25 NOTE — TOC Initial Note (Addendum)
Transition of Care [facility]) - Initial/Assessment Note    Patient Details  Name: Lawrence [name] MRN: [id number] Date of Birth: 1956-05-30  Transition of Care [facility] CM/SW Contact:    Gala Lewandowsky, RN Phone Number: 01/25/2023, 4:25 PM  Clinical Narrative:  Risk for readmission assessment completed. PTA patient states he was from home with S/O. Patient has DME: rollator, bedside commode, CPAP, and electric scooter in the home.  Plan will be to return home once stable. Case Manager discussed home health services RN/PT/OT and the patient is agreeable. Patient has used [facility] in the past and wants to use them again. Referral made with [facility] and start of care to begin within 24-48 hours post transition home. Patient has personal care services via [facility] 4 days a week total of 13 hours. Case Manager will continue to follow for additional needs as the patient progresses.                  Please follow patient for increased oxygen needs.   Expected Discharge Plan: Home w Home Health Services Barriers to Discharge: Continued Medical Work up   Patient Goals and CMS Choice Patient states their goals for this hospitalization and ongoing recovery are:: patient wants to return home.   Choice offered to / list presented to : NA (No choice needed-has used [facility] in the past.)      Expected Discharge Plan and Services In-house Referral: NA Discharge Planning Services: CM Consult Post Acute Care Choice: Home Health Living arrangements for the past 2 months: Apartment                 DME Arranged: N/A         HH Arranged: RN, Disease Management, PT, OT HH Agency: [facility] Date HH Agency Contacted: 01/25/23 Time HH Agency Contacted: 1624 Representative spoke with at [facility] Agency: Tresa Endo  Prior Living Arrangements/Services Living arrangements for the past 2 months: Apartment Lives with:: Significant Other Patient language and need for interpreter  reviewed:: Yes Do you feel safe going back to the place where you live?: Yes      Need for Family Participation in Patient Care: Yes (Comment) Care giver support system in place?: Yes (comment) Current home services: DME (CPAP< rollator, bedside commode and electric scooter.) Criminal Activity/Legal Involvement Pertinent to Current Situation/Hospitalization: No - Comment as needed  Activities of Daily Living Home Assistive Devices/Equipment: Bedside commode/3-in-1, CBG Meter, Electric scooter, Eyeglasses ADL Screening (condition at time of admission) Patient's cognitive ability adequate to safely complete daily activities?: Yes Is the patient deaf or have difficulty hearing?: No Does the patient have difficulty seeing, even when wearing glasses/contacts?: Yes Does the patient have difficulty concentrating, remembering, or making decisions?: Yes Patient able to express need for assistance with ADLs?: Yes Does the patient have difficulty dressing or bathing?: Yes Independently performs ADLs?: No Communication: Independent Dressing (OT): Needs assistance Is this a change from baseline?: Pre-admission baseline Grooming: Needs assistance Is this a change from baseline?: Pre-admission baseline Feeding: Independent Bathing: Needs assistance Is this a change from baseline?: Pre-admission baseline Toileting: Needs assistance Is this a change from baseline?: Pre-admission baseline In/Out Bed: Needs assistance Is this a change from baseline?: Pre-admission baseline Walks in Home: Independent with device (comment) Does the patient have difficulty walking or climbing stairs?: No Weakness of Legs: Both Weakness of Arms/Hands: None  Permission Sought/Granted Permission sought to share information with : Family Supports, Magazine features editor, Case Manager Permission granted to share information  with : Yes, Verbal Permission Granted     Permission granted to share info w AGENCY:  [facility]        Emotional Assessment Appearance:: Appears stated age Attitude/Demeanor/Rapport: Engaged Affect (typically observed): Appropriate Orientation: : Oriented to Self, Oriented to Place, Oriented to  Time Alcohol / Substance Use: Not Applicable Psych Involvement: No (comment)  Admission diagnosis:  Hypokalemia [E87.6] Hypoxia [R09.02] Acute hypoxic respiratory failure (HCC) [J96.01] Patient Active Problem List   Diagnosis Date Noted   Acute hypoxic respiratory failure (HCC) 01/23/2023   Nail, injury by, initial encounter 12/09/2022   Heart failure (HCC) 10/27/2022   Gout attack 10/27/2022   CHF (congestive heart failure) (HCC) 10/26/2022   Right sided weakness 05/26/2022   Dysarthria 05/25/2022   Ischemic stroke (HCC) 02/25/2022   Hemiparesis affecting left side as late effect of cerebrovascular accident (CVA) (HCC) 02/25/2022   Durable power of attorney for healthcare exists  02/25/2022   Depression 08/07/2021   Paronychia of great toe, left 07/07/2021   Stroke-like symptoms 04/27/2021   Ambulatory dysfunction 04/27/2021   Left-sided weakness 02/09/2021   Atrial fibrillation with rapid ventricular response (HCC) 02/08/2021   AF (paroxysmal atrial fibrillation) (HCC) 01/25/2021   COPD (chronic obstructive pulmonary disease) (HCC) 01/25/2021   Aortic stenosis 01/21/2021   Acquired dilation of ascending aorta and aortic root (HCC) 01/21/2021   Coronary artery disease due to lipid rich plaque    SVT (supraventricular tachycardia)    Seizure disorder (HCC)    Essential hypertension 10/17/2020   Hypokalemia 10/17/2020   Cellulitis of right leg 12/04/2019   Pain due to onychomycosis of toenails of both feet 09/11/2019   Diabetic neuropathy (HCC) 09/11/2019   Type 2 diabetes mellitus with hyperlipidemia (HCC) 09/11/2019   Sinus tachycardia 03/07/2019   Chronic venous stasis 03/07/2019   History of anemia 12/26/2018   Anemia 08/24/2018   History of  colon polyps 08/24/2018   Do not resuscitate status 08/24/2018   Chronic diastolic CHF (congestive heart failure) (HCC) 08/18/2018   Coronary artery calcification seen on CAT scan    Pure hypercholesterolemia    Chest pain    Shortness of breath    Right foot pain 01/13/2018   Acute on chronic diastolic CHF (congestive heart failure) (HCC) 01/13/2018   Diabetes mellitus type 2 in obese 01/13/2018   Lower extremity cellulitis 11/07/2017   Primary osteoarthritis, left shoulder 03/05/2017   BPH without obstruction/lower urinary tract symptoms 02/22/2017   Hematuria 02/22/2017   Peripheral neuropathy 02/22/2017   Physical deconditioning 02/22/2017   Class 3 obesity (HCC)    PTSD (post-traumatic stress disorder)    Anasarca 01/31/2017   Bilateral lower extremity edema 01/31/2017   OSA (obstructive sleep apnea)    PCP:  [name], PA-C Pharmacy:   [facility] - [place], [place] - [address] [place] [address] Phone: [phone] Fax: [phone]  [facility] [address] [place] [address] Phone: [phone] Fax: [phone]  [id number] [place] [facility] - [address] [place] [address] Phone: [phone] Fax: [phone]  Social Determinants of Health (SDOH) Social History: SDOH Screenings   Food Insecurity: Food Insecurity Present (01/23/2023)  Housing: Medium Risk (01/23/2023)  Transportation Needs: No Transportation Needs (01/23/2023)  Utilities: Not At Risk (01/23/2023)  Alcohol Screen: Low Risk  (06/08/2021)  Financial Resource Strain: Medium Risk (06/08/2021)  Tobacco Use: Medium Risk (01/22/2023)   SDOH Interventions:  Food Insecurity Interventions: Other (Comment) (CSW offered patient food resources. Patient accepted.)   Readmission Risk Interventions    01/25/2023    4:23 PM 08/12/2021   10:14 AM 08/12/2021   10:12 AM  Readmission  Risk Prevention Plan  Transportation Screening Complete Complete Complete  PCP or Specialist Appt within 3-5 Days Complete    HRI or Home Care Consult Complete    Social Work Consult for Recovery Care Planning/Counseling Complete    Palliative Care Screening Not Applicable    Medication Review Oceanographer) Referral to Pharmacy Complete Complete  PCP or Specialist appointment within 3-5 days of discharge  Complete   HRI or Home Care Consult  Complete   SW Recovery Care/Counseling Consult  Complete   Palliative Care Screening  Not Applicable   Skilled Nursing Facility  Not Applicable

## 2023-01-25 NOTE — Progress Notes (Signed)
CPAP refused @ this time. Pt only wore for a little over an hour last night. Does require increased LPM flow via [place] when sleeping. At 2LPM Sats drop to 86%. Will continue to monitor pt and oxygen.

## 2023-01-25 NOTE — Progress Notes (Signed)
   01/24/23 2302  BiPAP/CPAP/SIPAP  $ Non-Invasive Home Ventilator  Initial  $ Face Mask Large  Yes  BiPAP/CPAP/SIPAP Pt Type Adult  BiPAP/CPAP/SIPAP Resmed  Mask Type Full face mask  Mask Size Large  IPAP  (16 max)  EPAP  (10 min)  Pressure Support 2 cmH20  Flow Rate 6 lpm (bled in)  BiPAP/CPAP /SiPAP Vitals  Pulse Rate 88  Resp 18  SpO2 92 %  MEWS Score/Color  MEWS Score 0  MEWS Score Color [place]

## 2023-01-25 NOTE — Evaluation (Signed)
Physical Therapy Evaluation  Patient Details Name: Lawrence [name] MRN: [id number] DOB: 1955/08/10 Today's Date: 01/25/2023  History of Present Illness  Pt is a 67 y/o male who presents 01/22/23 with SOB and intermittent L sided CP for ~2 weeks PTA. R/L heart cath scheduled for 6/20 while inpatient. PMH significant for HTN, CHF, PAF on Eliquis, aortic stenosis, CAD, CVA, DM II, COPD, AAA.   Clinical Impression  Pt admitted with above diagnosis. Pt currently with functional limitations due to the deficits listed below (see PT Problem List). At the time of PT eval, pt was able to perform transfer up from the chair with mod-max assist and ambulation with min guard assist. Pt reports he feels the chair is low and he is fatigued from sitting up, however was motivated to sit in the chair at end of session. Pt may require the Stedy to power up to stand for back to bed with nursing staff. Pt on RA throughout session and was able to maintain sats around 90% throughout session. He occasionally dropped into the 80's and was briefly as low as 86% but recovered almost immediately with a seated rest break. Supplemental O2 donned at end of session at 2L/min - RN aware and agreeable. He states at baseline he is typically able to ambulate <50' at a time before he would require a seated rest break and would be mildly SOB. During session, pt with 4/4 DOE and requested supplemental O2 upon return to the chair. Pt will benefit from acute skilled PT to increase their independence and safety with mobility to allow discharge.          Recommendations for follow up therapy are one component of a multi-disciplinary discharge planning process, led by the attending physician.  Recommendations may be updated based on patient status, additional functional criteria and insurance authorization.  Follow Up Recommendations       Assistance Recommended at Discharge Intermittent Supervision/Assistance  Patient can return home with  the following  A little help with walking and/or transfers;A little help with bathing/dressing/bathroom;Assistance with cooking/housework;Assist for transportation;Help with stairs or ramp for entrance    Equipment Recommendations None recommended by PT  Recommendations for Other Services       Functional Status Assessment Patient has had a recent decline in their functional status and demonstrates the ability to make significant improvements in function in a reasonable and predictable amount of time.     Precautions / Restrictions Precautions Precautions: Fall Restrictions Weight Bearing Restrictions: No      Mobility  Bed Mobility               General bed mobility comments: Pt was received sitting up in the recliner.    Transfers Overall transfer level: Needs assistance Equipment used: Rolling walker (2 wheels) Transfers: Sit to/from Stand Sit to Stand: Mod assist, Max assist           General transfer comment: Multiple attempts and heavy mod approaching max assist to power up to full stand. VC's for hand placement on seated surface for safety. Difficulty with anterior weight shift to bring hands to walker and bear weight through UE's.    Ambulation/Gait Ambulation/Gait assistance: Min guard Gait Distance (Feet): 25 Feet Assistive device: Rolling walker (2 wheels) Gait Pattern/deviations: Step-through pattern, Decreased stride length, Wide base of support, Trunk flexed Gait velocity: Decreased Gait velocity interpretation: <1.31 ft/sec, indicative of household ambulator   General Gait Details: Pt was able to ambulate around room with RW for support.  Pt on RA with sats hovering around 90% with occasional dip down into the 80's. As low as 86% briefly.  Stairs            Wheelchair Mobility    Modified Rankin (Stroke Patients Only)       Balance Overall balance assessment: Needs assistance Sitting-balance support: No upper extremity supported, Feet  supported Sitting balance-Leahy Scale: Fair     Standing balance support: Bilateral upper extremity supported, During functional activity, Reliant on assistive device for balance Standing balance-Leahy Scale: Poor                               Pertinent Vitals/Pain Pain Assessment Pain Assessment: No/denies pain    Home Living Family/patient expects to be discharged to:: Private residence Living Arrangements: Spouse/significant other Available Help at Discharge: Family;Available 24 hours/day;Personal care attendant Type of Home: Apartment Home Access: Level entry       Home Layout: One level Home Equipment: Rollator (4 wheels);Electric scooter Additional Comments: Has a PCA 4 days a week for about 4 hours a day (13 hour week)    Prior Function Prior Level of Function : Needs assist             Mobility Comments: Rollator for household mobility (short distances); electric scooter in community dwellings. ADLs Comments: Reports some assist for ADLs from aide (bathing more than dressing). Able to complete toileting with min A.  BSC in bedroom, cannot access bathroom.     Hand Dominance   Dominant Hand: Right    Extremity/Trunk Assessment   Upper Extremity Assessment Upper Extremity Assessment: Overall WFL for tasks assessed    Lower Extremity Assessment Lower Extremity Assessment: Generalized weakness (mild; however with quick muscular fatigue with OOB mobility.)    Cervical / Trunk Assessment Cervical / Trunk Assessment: Other exceptions Cervical / Trunk Exceptions: Forward head posture with rounded shoulders. Increased body habitus due to morbid obesity.  Communication   Communication: No difficulties  Cognition Arousal/Alertness: Awake/alert Behavior During Therapy: WFL for tasks assessed/performed Overall Cognitive Status: Within Functional Limits for tasks assessed                                          General Comments       Exercises     Assessment/Plan    PT Assessment Patient needs continued PT services  PT Problem List Decreased strength;Decreased range of motion;Decreased activity tolerance;Decreased balance;Decreased mobility;Decreased knowledge of use of DME;Decreased safety awareness;Decreased knowledge of precautions;Cardiopulmonary status limiting activity       PT Treatment Interventions DME instruction;Gait training;Functional mobility training;Therapeutic activities;Therapeutic exercise;Balance training;Patient/family education    PT Goals (Current goals can be found in the Care Plan section)  Acute Rehab PT Goals Patient Stated Goal: Back to baseline of function PT Goal Formulation: With patient Time For Goal Achievement: 02/08/23 Potential to Achieve Goals: Good    Frequency Min 3X/week     Co-evaluation               AM-PAC PT "6 Clicks" Mobility  Outcome Measure Help needed turning from your back to your side while in a flat bed without using bedrails?: A Little Help needed moving from lying on your back to sitting on the side of a flat bed without using bedrails?: A Little Help needed moving to and from  a bed to a chair (including a wheelchair)?: A Lot Help needed standing up from a chair using your arms (e.g., wheelchair or bedside chair)?: A Lot Help needed to walk in hospital room?: A Little Help needed climbing 3-5 steps with a railing? : Total 6 Click Score: 14    End of Session Equipment Utilized During Treatment: Gait belt;Oxygen Activity Tolerance: Patient tolerated treatment well Patient left: in chair;with call bell/phone within reach;with chair alarm set Nurse Communication: Mobility status (O2 status) PT Visit Diagnosis: Unsteadiness on feet (R26.81);Difficulty in walking, not elsewhere classified (R26.2)    Time: 1610-9604 PT Time Calculation (min) (ACUTE ONLY): 24 min   Charges:   PT Evaluation $PT Eval Moderate Complexity: 1 Mod PT  Treatments $Gait Training: 8-22 mins        Conni Slipper, PT, DPT Acute Rehabilitation Services Secure Chat Preferred Office: [phone]   Marylynn Pearson 01/25/2023, 1:02 PM

## 2023-01-25 NOTE — Progress Notes (Signed)
Patient removed and refuses his CPAP. O2 sat at 86-89 on 4L Nasal Cannula. Increased O2 to 6L with O2 sat on 90-93. -Felicity Coyer, RN

## 2023-01-26 ENCOUNTER — Ambulatory Visit (HOSPITAL_COMMUNITY): Admission: RE | Admit: 2023-01-26 | Payer: Medicare HMO | Source: Home / Self Care | Admitting: Internal Medicine

## 2023-01-26 ENCOUNTER — Other Ambulatory Visit: Payer: Self-pay | Admitting: Endocrinology

## 2023-01-26 ENCOUNTER — Encounter (HOSPITAL_COMMUNITY)
Admission: EM | Disposition: A | Discharge status: Home / Self Care | Payer: Self-pay | Source: Home / Self Care | Attending: Internal Medicine

## 2023-01-26 DIAGNOSIS — I350 Nonrheumatic aortic (valve) stenosis: Secondary | ICD-10-CM

## 2023-01-26 DIAGNOSIS — I5033 Acute on chronic diastolic (congestive) heart failure: Secondary | ICD-10-CM | POA: Diagnosis not present

## 2023-01-26 DIAGNOSIS — Z794 Long term (current) use of insulin: Secondary | ICD-10-CM | POA: Diagnosis not present

## 2023-01-26 DIAGNOSIS — I493 Ventricular premature depolarization: Secondary | ICD-10-CM | POA: Diagnosis not present

## 2023-01-26 DIAGNOSIS — E876 Hypokalemia: Secondary | ICD-10-CM | POA: Diagnosis not present

## 2023-01-26 DIAGNOSIS — E1165 Type 2 diabetes mellitus with hyperglycemia: Secondary | ICD-10-CM | POA: Diagnosis not present

## 2023-01-26 DIAGNOSIS — J9601 Acute respiratory failure with hypoxia: Secondary | ICD-10-CM | POA: Diagnosis not present

## 2023-01-26 HISTORY — PX: RIGHT HEART CATH AND CORONARY ANGIOGRAPHY: CATH118264

## 2023-01-26 LAB — POCT I-STAT EG7
Acid-Base Excess: 9 mmol/L — ABNORMAL HIGH (ref 0.0–2.0)
Acid-Base Excess: 6 mmol/L — ABNORMAL HIGH (ref 0.0–2.0)
Acid-Base Excess: 9 mmol/L — ABNORMAL HIGH (ref 0.0–2.0)
Bicarbonate: 35.8 mmol/L — ABNORMAL HIGH (ref 20.0–28.0)
Bicarbonate: 31.3 mmol/L — ABNORMAL HIGH (ref 20.0–28.0)
Bicarbonate: 35.7 mmol/L — ABNORMAL HIGH (ref 20.0–28.0)
Calcium, Ion: 1.26 mmol/L (ref 1.15–1.40)
Calcium, Ion: 1.11 mmol/L — ABNORMAL LOW (ref 1.15–1.40)
Calcium, Ion: 1.18 mmol/L (ref 1.15–1.40)
HCT: 43 % (ref 39.0–52.0)
HCT: 43 % (ref 39.0–52.0)
HCT: 45 % (ref 39.0–52.0)
Hemoglobin: 14.6 g/dL (ref 13.0–17.0)
Hemoglobin: 14.6 g/dL (ref 13.0–17.0)
Hemoglobin: 15.3 g/dL (ref 13.0–17.0)
O2 Saturation: 67 %
O2 Saturation: 64 %
O2 Saturation: 88 %
Potassium: 3.6 mmol/L (ref 3.5–5.1)
Potassium: 3.7 mmol/L (ref 3.5–5.1)
Potassium: 3.7 mmol/L (ref 3.5–5.1)
Sodium: 141 mmol/L (ref 135–145)
Sodium: 139 mmol/L (ref 135–145)
Sodium: 141 mmol/L (ref 135–145)
TCO2: 37 mmol/L — ABNORMAL HIGH (ref 22–32)
TCO2: 33 mmol/L — ABNORMAL HIGH (ref 22–32)
TCO2: 37 mmol/L — ABNORMAL HIGH (ref 22–32)
pCO2, Ven: 53.9 mmHg (ref 44–60)
pCO2, Ven: 45.9 mmHg (ref 44–60)
pCO2, Ven: 54.9 mmHg (ref 44–60)
pH, Ven: 7.429 (ref 7.25–7.43)
pH, Ven: 7.42 (ref 7.25–7.43)
pH, Ven: 7.442 — ABNORMAL HIGH (ref 7.25–7.43)
pO2, Ven: 35 mmHg (ref 32–45)
pO2, Ven: 34 mmHg (ref 32–45)
pO2, Ven: 54 mmHg — ABNORMAL HIGH (ref 32–45)

## 2023-01-26 LAB — URIC ACID: Uric Acid, Serum: 6 mg/dL (ref 3.7–8.6)

## 2023-01-26 LAB — BASIC METABOLIC PANEL
Anion gap: 10 (ref 5–15)
BUN: 22 mg/dL (ref 8–23)
CO2: 30 mmol/L (ref 22–32)
Calcium: 8.8 mg/dL — ABNORMAL LOW (ref 8.9–10.3)
Chloride: 97 mmol/L — ABNORMAL LOW (ref 98–111)
Creatinine, Ser: 0.93 mg/dL (ref 0.61–1.24)
GFR, Estimated: >60 mL/min (ref >60)
Glucose, Bld: 149 mg/dL — ABNORMAL HIGH (ref 70–99)
Potassium: 3.1 mmol/L — ABNORMAL LOW (ref 3.5–5.1)
Sodium: 137 mmol/L (ref 135–145)

## 2023-01-26 LAB — GLUCOSE, CAPILLARY
Glucose-Capillary: 121 mg/dL — ABNORMAL HIGH (ref 70–99)
Glucose-Capillary: 134 mg/dL — ABNORMAL HIGH (ref 70–99)
Glucose-Capillary: 114 mg/dL — ABNORMAL HIGH (ref 70–99)

## 2023-01-26 SURGERY — RIGHT HEART CATH AND CORONARY ANGIOGRAPHY
Anesthesia: LOCAL

## 2023-01-26 MED ORDER — INSULIN GLARGINE-YFGN 100 UNIT/ML ~~LOC~~ SOLN
15.0000 [IU] | Freq: Every day | SUBCUTANEOUS | Status: DC
  Filled 2023-01-26: qty 0.15

## 2023-01-26 MED ORDER — MIDAZOLAM HCL 2 MG/2ML IJ SOLN
INTRAMUSCULAR | Status: AC
  Filled 2023-01-26: qty 2

## 2023-01-26 MED ORDER — SODIUM CHLORIDE 0.9 % IV SOLN: INTRAVENOUS | Status: DC

## 2023-01-26 MED ORDER — SODIUM CHLORIDE 0.9% FLUSH
3.0000 mL | Freq: Two times a day (BID) | INTRAVENOUS | Status: DC
  Administered 2023-01-26 – 2023-02-02 (×15): 3 mL via INTRAVENOUS

## 2023-01-26 MED ORDER — HEPARIN SODIUM (PORCINE) 1000 UNIT/ML IJ SOLN
INTRAMUSCULAR | Status: AC
  Filled 2023-01-26: qty 10

## 2023-01-26 MED ORDER — INSULIN GLARGINE-YFGN 100 UNIT/ML ~~LOC~~ SOLN: 30.0000 [IU] | Freq: Every day | SUBCUTANEOUS | Status: DC

## 2023-01-26 MED ORDER — SODIUM CHLORIDE 0.9 % IV SOLN: 250.0000 mL | INTRAVENOUS | Status: DC | PRN

## 2023-01-26 MED ORDER — INSULIN GLARGINE-YFGN 100 UNIT/ML ~~LOC~~ SOLN
15.0000 [IU] | Freq: Every day | SUBCUTANEOUS | Status: AC
  Administered 2023-01-26: 15 [IU] via SUBCUTANEOUS
  Filled 2023-01-26: qty 0.15

## 2023-01-26 MED ORDER — MIDAZOLAM HCL 2 MG/2ML IJ SOLN
INTRAMUSCULAR | Status: DC | PRN
  Administered 2023-01-26: 2 mg via INTRAVENOUS

## 2023-01-26 MED ORDER — FENTANYL CITRATE (PF) 100 MCG/2ML IJ SOLN
INTRAMUSCULAR | Status: AC
  Filled 2023-01-26: qty 2

## 2023-01-26 MED ORDER — IOHEXOL 350 MG/ML SOLN
INTRAVENOUS | Status: DC | PRN
  Administered 2023-01-26: 45 mL

## 2023-01-26 MED ORDER — INSULIN GLARGINE-YFGN 100 UNIT/ML ~~LOC~~ SOLN
30.0000 [IU] | Freq: Every day | SUBCUTANEOUS | Status: DC
  Administered 2023-01-27 – 2023-02-08 (×11): 30 [IU] via SUBCUTANEOUS
  Filled 2023-01-26 (×13): qty 0.3

## 2023-01-26 MED ORDER — LIDOCAINE HCL (PF) 1 % IJ SOLN
INTRAMUSCULAR | Status: AC
  Filled 2023-01-26: qty 30

## 2023-01-26 MED ORDER — ASPIRIN 81 MG PO CHEW: 81.0000 mg | CHEWABLE_TABLET | ORAL | Status: DC

## 2023-01-26 MED ORDER — LIDOCAINE HCL (PF) 1 % IJ SOLN
INTRAMUSCULAR | Status: DC | PRN
  Administered 2023-01-26: 5 mL

## 2023-01-26 MED ORDER — VERAPAMIL HCL 2.5 MG/ML IV SOLN
INTRAVENOUS | Status: AC
  Filled 2023-01-26: qty 2

## 2023-01-26 MED ORDER — POTASSIUM CHLORIDE CRYS ER 20 MEQ PO TBCR
40.0000 meq | EXTENDED_RELEASE_TABLET | ORAL | Status: AC
  Administered 2023-01-26 (×2): 40 meq via ORAL
  Filled 2023-01-26 (×2): qty 2

## 2023-01-26 MED ORDER — HEPARIN (PORCINE) IN NACL 1000-0.9 UT/500ML-% IV SOLN
INTRAVENOUS | Status: DC | PRN
  Administered 2023-01-26 (×2): 500 mL

## 2023-01-26 MED ORDER — VERAPAMIL HCL 2.5 MG/ML IV SOLN
INTRAVENOUS | Status: DC | PRN
  Administered 2023-01-26: 10 mL via INTRA_ARTERIAL

## 2023-01-26 MED ORDER — HEPARIN SODIUM (PORCINE) 1000 UNIT/ML IJ SOLN
INTRAMUSCULAR | Status: DC | PRN
  Administered 2023-01-26: 5000 [IU] via INTRAVENOUS

## 2023-01-26 MED ORDER — SODIUM CHLORIDE 0.9% FLUSH: 3.0000 mL | INTRAVENOUS | Status: DC | PRN

## 2023-01-26 MED ORDER — FENTANYL CITRATE (PF) 100 MCG/2ML IJ SOLN
INTRAMUSCULAR | Status: DC | PRN
  Administered 2023-01-26: 50 ug via INTRAVENOUS

## 2023-01-26 MED ORDER — ASPIRIN 81 MG PO CHEW
81.0000 mg | CHEWABLE_TABLET | ORAL | Status: AC
  Administered 2023-01-26: 81 mg via ORAL

## 2023-01-26 SURGICAL SUPPLY — 12 items
CATH BALLN WEDGE 5F 110CM (CATHETERS) IMPLANT
CATH DIAG 6FR JR4 (CATHETERS) IMPLANT
CATH OPTITORQUE TIG 4.0 6F (CATHETERS) IMPLANT
DEVICE RAD COMP TR BAND LRG (VASCULAR PRODUCTS) IMPLANT
GLIDESHEATH SLEND SS 6F .021 (SHEATH) IMPLANT
GUIDEWIRE .025 260CM (WIRE) IMPLANT
KIT HEART LEFT (KITS) ×1 IMPLANT
PACK CARDIAC CATHETERIZATION (CUSTOM PROCEDURE TRAY) ×1 IMPLANT
SHEATH GLIDE SLENDER 4/5FR (SHEATH) IMPLANT
TRANSDUCER W/STOPCOCK (MISCELLANEOUS) ×1 IMPLANT
TUBING CIL FLEX 10 FLL-RA (TUBING) ×1 IMPLANT
WIRE EMERALD 3MM-J .035X260CM (WIRE) IMPLANT

## 2023-01-26 NOTE — Plan of Care (Signed)

## 2023-01-26 NOTE — Progress Notes (Signed)
PROGRESS NOTE Lawrence [name]  ZOX:[id number] DOB: 1955-10-07 DOA: 01/22/2023 PCP: [name], PA-C   Brief Narrative/Hospital Course: 67 y.o.m w/ hypertension,CHF, PAF on Eliquis, aortic stenosis, CAD, CVA, diabetes mellitus type 2, COPD, AAA, prior history of tobacco abuse, morbid obesity, and OSA on CPAP presented with complaints of shortness of breath. He was intermittently having left-sided chest pain for the last 2 weeks, radiating into his neck and shoulder, recently had an echocardiogram done on 5/16 which noted EF to be 60 -65% with normal diastolic parameters.  Plan was for patient to have a right and left heart cath on the June 25.He had also may notice some mild redness of his lower extremities.    In ED: afebrile with pulse 89-111, RR 16-31,BP 91/47-130/112, O2 saturations noted to be as low as 89% with improvement ON 4 L of [place],Labs significant for WBC 13 >-11.8, potassium 3.3, BUN 24-> 28, creatinine 1->0.9, calcium 8.6->.8.4, BNP 25.3, high-sensitivity troponin negative x 2.  Cxr-no acute abnormality.  CT angiogram of the chest have been obtained which showed no evidence of a acute arterial embolic filling defect, but did note chronic linear calcified thrombus in the segmental artery of the basal left lower lobe. Patient was admitted for acute respiratory hypoxia in the setting of COPD exacerbation ABG showed no CO2 retention but said pCO2 in 70s  Subjective: Patient mentioned that he slept well overnight.  Denies any chest pain.  Shortness of breath slightly better.  Still having difficulty breathing at times.  No nausea or vomiting.  His wife was on speaker phone while I was in the room.  Assessment and Plan:   Acute respiratory failure with hypoxia and hypercapnia/dyspnea on exertion/Chest pain: Patient requiring 4 L of oxygen by nasal cannula.  Typically does not use oxygen at home. Symptoms are likely due to acute diastolic CHF.  Patient also with severe aortic  stenosis. CT angiogram did not show any PE COVID-19 and respiratory viral panel were negative. He is on his home inhaler treatments as well. Oxygen requirements appear to have improved though he does tend to desaturate at night especially when he is not using CPAP. Continue with furosemide 80 mg twice a day.  Cardiology is following.  Plan is for cardiac catheterization today.  Acute on chronic Diastolic congestive heart failure Echocardiogram from May showed EF to be 60 to 65%.  Likely has diastolic dysfunction. Noted to be fluid overloaded.  Remains on IV furosemide. Patient also on spironolactone, metolazone. Strict ins and outs and daily weights. Cardiology is following. Left and right heart catheterization is planned for today.   Hypokalemia: Continue to supplement potassium.  Magnesium was 2.1 yesterday.     COPD Continue home medications.   Type 2 diabetes mellitus with long-term use of insulin: HbA1c 6.4.  Continue SSI, Farxiga, glargine.  Dose of glargine to be decreased for cardiac cath.   Paroxysmal atrial fibrillation  Rate controlled on Cardizem.  Eliquis on hold for cardiac catheterization.   Essential hypertension: Soft blood pressures noted.  He is on multiple antihypertensives including diltiazem, metoprolol, Minipress.  Also on diuretics including furosemide and spironolactone. Defer to cardiology.   Anxiety and depression Mood stable continue as BuSpar, Abilify, venlafaxine   Seizure disorder Continue home Keppra and lacosamide  Obstructive sleep apnea Continue CPAP  Morbid obesity Estimated body mass index is 46.48 kg/m as calculated from the following:   Height as of this encounter: 6\' 2"  (1.88 m).   Weight as of this encounter:  164.2 kg.  DVT prophylaxis: Eliquis on hold.  Currently on SCDs. Code Status: DNR Family Communication: Discussed with patient.  No family at bedside.  Discussed with wife over phone Dispo: Home health recommended by  physical therapy.  Objective:  Vitals:   01/25/23 2330 01/26/23 0500 01/26/23 0739 01/26/23 0842  BP:    (!) 96/56  Pulse:   76   Resp:   18 16  Temp:      TempSrc:      SpO2: 95%  95% 95%  Weight:  (!) 164.2 kg    Height:       Weight change: 0.045 kg  Physical Examination:  General appearance: Awake alert.  In no distress Resp: Normal effort at rest.  Fewer crackles today compared to yesterday.  No wheezing or rhonchi. Cardio: S1-S2 is normal regular.  No S3-S4.  No rubs murmurs or bruit GI: Abdomen is soft.  Nontender nondistended.  Bowel sounds are present normal.  No masses organomegaly Extremities: Pedal edema bilateral lower extremities. Neurologic: Alert and oriented x3.  No focal neurological deficits.      Medications:  Scheduled Meds:  allopurinol  200 mg Oral BID   ARIPiprazole  5 mg Oral QHS   aspirin  81 mg Oral Pre-Cath   atorvastatin  40 mg Oral Daily   busPIRone  10 mg Oral TID   dapagliflozin propanediol  10 mg Oral Q breakfast   diltiazem  120 mg Oral Daily   fenofibrate  160 mg Oral Daily   fluticasone furoate-vilanterol  1 puff Inhalation Daily   And   umeclidinium bromide  1 puff Inhalation Daily   insulin aspart  0-15 Units Subcutaneous TID WC   insulin aspart  0-5 Units Subcutaneous QHS   insulin glargine-yfgn  15 Units Subcutaneous Daily   Followed by   Melene Muller ON 01/27/2023] insulin glargine-yfgn  30 Units Subcutaneous Daily   lacosamide  150 mg Oral BID   levETIRAcetam  500 mg Oral BID   metolazone  2.5 mg Oral Q Tue   metoprolol succinate  50 mg Oral Daily   mirtazapine  15 mg Oral QHS   potassium chloride  40 mEq Oral Q4H   prazosin  2 mg Oral QHS   pregabalin  150 mg Oral TID   sodium chloride flush  3 mL Intravenous Q12H   sodium chloride flush  3 mL Intravenous Q12H   spironolactone  12.5 mg Oral Daily   traZODone  200 mg Oral QHS   venlafaxine XR  225 mg Oral Q breakfast  Continuous Infusions:  sodium chloride     sodium  chloride Stopped (01/26/23 0846)     Diet Order             Diet NPO time specified Except for: Sips with Meds  Diet effective midnight                  Intake/Output Summary (Last 24 hours) at 01/26/2023 0856 Last data filed at 01/26/2023 0350 Gross per 24 hour  Intake 720 ml  Output 3625 ml  Net -2905 ml    Net IO Since Admission: -9,520 mL [01/26/23 0856]  Wt Readings from Last 3 Encounters:  01/26/23 (!) 164.2 kg  01/19/23 (!) 163.3 kg  01/12/23 (!) 162.8 kg     Data Reviewed: I have personally reviewed following labs and reports of imaging studies  CBC: Recent Labs  Lab 01/22/23 2054 01/23/23 0538 01/24/23 0220  WBC 13.0* 11.8* 9.6  NEUTROABS  --  8.5*  --   HGB 14.1 13.5 13.4  HCT 44.2 41.9 41.6  MCV 92.1 91.3 91.0  PLT 269 260 242    Basic Metabolic Panel: Recent Labs  Lab 01/22/23 2054 01/23/23 0538 01/24/23 0220 01/25/23 0023 01/26/23 0204  NA 139 139 139 139 137  K 3.2* 3.3* 3.1* 3.7 3.1*  CL 99 99 97* 97* 97*  CO2 30 30 28  32 30  GLUCOSE 91 92 154* 125* 149*  BUN 24* 28* 17 19 22   CREATININE 1.00 0.90 0.77 0.85 0.93  CALCIUM 8.6* 8.4* 8.9 9.0 8.8*  MG  --  2.0  --  2.1  --   PHOS  --  3.3  --   --   --     GFR: Estimated Creatinine Clearance: 128.8 mL/min (by C-G formula based on SCr of 0.93 mg/dL).  Liver Function Tests: Recent Labs  Lab 01/23/23 0538  AST 14*  ALT 19  ALKPHOS 72  BILITOT 0.5  PROT 6.5  ALBUMIN 3.2*    Recent Labs  Lab 01/25/23 0735 01/25/23 1140 01/25/23 1607 01/25/23 2156 01/26/23 0745  GLUCAP 140* 163* 145* 209* 121*    Recent Results (from the past 240 hour(s))  Respiratory (~20 pathogens) panel by PCR     Status: None   Collection Time: 01/23/23  8:35 AM   Specimen: Nasopharyngeal Swab; Respiratory  Result Value Ref Range Status   Adenovirus NOT DETECTED NOT DETECTED Final   Coronavirus 229E NOT DETECTED NOT DETECTED Final    Comment: (NOTE) The Coronavirus on the Respiratory Panel, DOES  NOT test for the novel  Coronavirus (2019 nCoV)    Coronavirus HKU1 NOT DETECTED NOT DETECTED Final   Coronavirus NL63 NOT DETECTED NOT DETECTED Final   Coronavirus OC43 NOT DETECTED NOT DETECTED Final   Metapneumovirus NOT DETECTED NOT DETECTED Final   Rhinovirus / Enterovirus NOT DETECTED NOT DETECTED Final   Influenza A NOT DETECTED NOT DETECTED Final   Influenza B NOT DETECTED NOT DETECTED Final   Parainfluenza Virus 1 NOT DETECTED NOT DETECTED Final   Parainfluenza Virus 2 NOT DETECTED NOT DETECTED Final   Parainfluenza Virus 3 NOT DETECTED NOT DETECTED Final   Parainfluenza Virus 4 NOT DETECTED NOT DETECTED Final   Respiratory Syncytial Virus NOT DETECTED NOT DETECTED Final   Bordetella pertussis NOT DETECTED NOT DETECTED Final   Bordetella Parapertussis NOT DETECTED NOT DETECTED Final   Chlamydophila pneumoniae NOT DETECTED NOT DETECTED Final   Mycoplasma pneumoniae NOT DETECTED NOT DETECTED Final    Comment: Performed at [facility], [address] [place], [place] [address]  SARS Coronavirus 2 by RT PCR (hospital order, performed in [facility] hospital lab) *cepheid single result test* Anterior Nasal Swab     Status: None   Collection Time: 01/23/23  8:35 AM   Specimen: Anterior Nasal Swab  Result Value Ref Range Status   SARS Coronavirus 2 by RT PCR NEGATIVE NEGATIVE Final    Comment: Performed at [facility], [address], [place], [place] [address]    Antimicrobials: Anti-infectives (From admission, onward)    Start     Dose/Rate Route Frequency Ordered Stop   01/22/23 0000  cephALEXin (KEFLEX) 500 MG capsule        500 mg Oral 4 times daily 01/22/23 2347        Culture/Microbiology    Component Value Date/Time   SDES URINE, CLEAN CATCH 08/07/2021 2100   SPECREQUEST NONE 08/07/2021  2100   CULT (A) 08/07/2021 2100    <10,000 COLONIES/mL INSIGNIFICANT GROWTH Performed at [facility], [address], [place], [place] [address]     REPTSTATUS 08/09/2021 FINAL 08/07/2021 2100   Radiology Studies: No results found.   LOS: 3 days   Osvaldo Shipper, MD [facility]  01/26/2023, 8:56 AM

## 2023-01-26 NOTE — Progress Notes (Signed)
Notified by cath lab RN of K 3.1, on for cath today. Will hold IV Lasix starting this AM, Rx KCL q4hr x 2 doses. Also needs insulin dose adjustment given plan for cath - have decreased basal to 1/2 dose this AM then full dose to resume tomorrow AM. Further recs pending formal rounding note.

## 2023-01-26 NOTE — Interval H&P Note (Signed)
History and Physical Interval Note:  01/26/2023 6:38 AM  Lawrence [name]  has presented today for surgery, with the diagnosis of chf - as - pre tavr possibly - AVR.  The various methods of treatment have been discussed with the patient and family. After consideration of risks, benefits and other options for treatment, the patient has consented to  Procedure(s): RIGHT/LEFT HEART CATH AND CORONARY ANGIOGRAPHY (N/A) as a surgical intervention.  The patient's history has been reviewed, patient examined, no change in status, stable for surgery.  I have reviewed the patient's chart and labs.  Questions were answered to the patient's satisfaction.     Orbie Pyo

## 2023-01-26 NOTE — Progress Notes (Addendum)
Progress Note  Patient Name: Lawrence [name] Date of Encounter: 01/26/2023  Primary Cardiologist: Armanda Magic, MD  Subjective   Feeling well without acute complaint. Still reports good UOP with IV lasix. On for [facility] today. Longtime significant other at bedside.   Inpatient Medications    Scheduled Meds:  allopurinol  200 mg Oral BID   ARIPiprazole  5 mg Oral QHS   aspirin  81 mg Oral Pre-Cath   atorvastatin  40 mg Oral Daily   busPIRone  10 mg Oral TID   dapagliflozin propanediol  10 mg Oral Q breakfast   diltiazem  120 mg Oral Daily   fenofibrate  160 mg Oral Daily   fluticasone furoate-vilanterol  1 puff Inhalation Daily   And   umeclidinium bromide  1 puff Inhalation Daily   insulin aspart  0-15 Units Subcutaneous TID WC   insulin aspart  0-5 Units Subcutaneous QHS   insulin glargine-yfgn  15 Units Subcutaneous Daily   Followed by   Melene Muller ON 01/27/2023] insulin glargine-yfgn  30 Units Subcutaneous Daily   lacosamide  150 mg Oral BID   levETIRAcetam  500 mg Oral BID   metolazone  2.5 mg Oral Q Tue   metoprolol succinate  50 mg Oral Daily   mirtazapine  15 mg Oral QHS   potassium chloride  40 mEq Oral Q4H   prazosin  2 mg Oral QHS   pregabalin  150 mg Oral TID   sodium chloride flush  3 mL Intravenous Q12H   sodium chloride flush  3 mL Intravenous Q12H   spironolactone  12.5 mg Oral Daily   traZODone  200 mg Oral QHS   venlafaxine XR  225 mg Oral Q breakfast   Continuous Infusions:  sodium chloride     sodium chloride Stopped (01/26/23 0846)   PRN Meds: sodium chloride, acetaminophen **OR** acetaminophen, albuterol, melatonin, ondansetron (ZOFRAN) IV, oxyCODONE, sodium chloride flush   Vital Signs    Vitals:   01/25/23 2330 01/26/23 0500 01/26/23 0739 01/26/23 0842  BP:    (!) 96/56  Pulse:   76   Resp:   18 16  Temp:      TempSrc:      SpO2: 95%  95% 95%  Weight:  (!) 164.2 kg    Height:        Intake/Output Summary (Last 24 hours) at 01/26/2023  0854 Last data filed at 01/26/2023 0350 Gross per 24 hour  Intake 720 ml  Output 3625 ml  Net -2905 ml      01/26/2023    5:00 AM 01/25/2023    3:00 AM 01/24/2023    3:45 AM  Last 3 Weights  Weight (lbs) 362 lb 361 lb 14.4 oz 368 lb 9.6 oz  Weight (kg) 164.202 kg 164.157 kg 167.196 kg     Telemetry    NSR, occ PVC - Personally Reviewed  Physical Exam   GEN: No acute distress. Morbidly obese. HEENT: Normocephalic, atraumatic, sclera non-icteric. Neck: No JVD or bruits. Cardiac: RRR, occasional ectopy, soft SEM, no rubs or gallops.  Respiratory: Clear to auscultation bilaterally. Breathing is unlabored on O2.  GI: Soft, obese, nontender BS +x 4. MS: no deformity. Extremities: No clubbing or cyanosis. Chronic hyperpigmentation changes of BLE, mild LE edema Neuro:  AAOx3. Follows commands. Psych:  Responds to questions appropriately with a normal affect.  Labs    High Sensitivity Troponin:   Recent Labs  Lab 01/22/23 2054 01/22/23 2348  TROPONINIHS 8 8  Cardiac EnzymesNo results for input(s): "TROPONINI" in the last 168 hours. No results for input(s): "TROPIPOC" in the last 168 hours.   Chemistry Recent Labs  Lab 01/23/23 0538 01/24/23 0220 01/25/23 0023 01/26/23 0204  NA 139 139 139 137  K 3.3* 3.1* 3.7 3.1*  CL 99 97* 97* 97*  CO2 30 28 32 30  GLUCOSE 92 154* 125* 149*  BUN 28* 17 19 22   CREATININE 0.90 0.77 0.85 0.93  CALCIUM 8.4* 8.9 9.0 8.8*  PROT 6.5  --   --   --   ALBUMIN 3.2*  --   --   --   AST 14*  --   --   --   ALT 19  --   --   --   ALKPHOS 72  --   --   --   BILITOT 0.5  --   --   --   GFRNONAA >60 >60 >60 >60  ANIONGAP 10 14 10 10      Hematology Recent Labs  Lab 01/22/23 2054 01/23/23 0538 01/24/23 0220  WBC 13.0* 11.8* 9.6  RBC 4.80 4.59 4.57  HGB 14.1 13.5 13.4  HCT 44.2 41.9 41.6  MCV 92.1 91.3 91.0  MCH 29.4 29.4 29.3  MCHC 31.9 32.2 32.2  RDW 15.6* 15.7* 15.4  PLT 269 260 242    BNP Recent Labs  Lab  01/22/23 2054  BNP 25.3     DDimer No results for input(s): "DDIMER" in the last 168 hours.   Radiology    No results found.  Cardiac Studies    Echocardiogram 12/22/2022: Impressions: 1. Left ventricular ejection fraction, by estimation, is 60 to 65%. The  left ventricle has normal function. The left ventricle has no regional  wall motion abnormalities. There is mild left ventricular hypertrophy.  Left ventricular diastolic parameters  were normal.   2. Right ventricular systolic function is normal. The right ventricular  size is normal.   3. Left atrial size was moderately dilated.   4. The mitral valve is abnormal. No evidence of mitral valve  regurgitation. No evidence of mitral stenosis.   5. AS has progressed since TTE done 05/2022. The aortic valve was not  well visualized. There is moderate calcification of the aortic valve.  There is moderate thickening of the aortic valve. Aortic valve  regurgitation is not visualized. Severe aortic  valve stenosis.   6. Aortic dilatation noted. There is mild dilatation of the aortic root,  measuring 38 mm. There is mild dilatation of the ascending aorta,  measuring 40 mm.   7. The inferior vena cava is normal in size with greater than 50%  respiratory variability, suggesting right atrial pressure of 3 mmHg.   Patient Profile     67 y.o. male with a history of non-obstructive CAD noted on cardiac catheterization in 2019, chronic diastolic CHF, paroxysmal atrial fibrillation on Eliquis, severe aortic stenosis noted on recent Echo in 12/2022, hypertension, hyperlipidemia, type 2 diabetes mellitus with peripheral neuropathy, COPD, obstructive sleep apnea on CPAP, seizure disorder, and prior tobacco use (quit about 6 years ago) who is being seen 01/24/2023 for the evaluation of CHF at the request of Dr. Madelyn Flavors.   Assessment & Plan    1. Acute hypoxic respiratory failure in setting of acute on chronic diastolic CHF, severe aortic  stenosis, HTN with some hypotension - Patient presented with shortness of breath and chest pain. BNP normal at 25 but this could be falsely low in setting of obesity (  up slightly from 19.5 on 01/12/2023). Chest x-ray and CTA showed no overt edema.  - Recent Echo on 12/22/2022 showed 60-65% with normal wall motion and diastolic parameters but progression of known aortic stenosis to the severe range. Cardiomems on 01/16/2023 was mildly elevated suggestive of fluid accumulation. - has diuresed well with IV Lasix (also takes metolazone once a week on Tuesdays) - volume status is challenging with obesity - for [facility] today - consent documented 6/19, no additional questions at this time - hold IV Lasix given soft BP, hypokalemia  - discussed additional regimen with MD re: SBP 96/56, awaiting input re: diltiazem, Toprol (already got [place] + spironolactone this AM) - per Dr. Anne Fu OK to hold, relayed to nurse  2. Chest Pain, h/o non-obstructive CAD, HLD - some atypical CP earlier in admission, possibly related to volume overload with negative troponins - cath planned as above - continue statin  3. H/o PAF (on OAC PTA) with frequent PVCs on telemetry - Eliquis on hold for cath - occasional PVCs on telemetry - continue BB as tolerated, Toprol increased this admission but holding this AM as above - continue to replete potassium, Mg has been wnl  4. Hypokalemia - K 3.1 -. Will give q4hr x 2 doses and hold IV Lasix in prep for cath today, continue to follow   Otherwise, per primary team: - COPD - Type 2 diabetes mellitus -> insulin was temporarily adjusted for cath this AM - Seizure disorder - Anxiety and depression  For questions or updates, please contact [facility] Please consult [email or web address] for contact info under Cardiology/STEMI.  Signed, Laurann Montana, PA-C 01/26/2023, 8:54 AM    Personally seen and examined. Agree with above.  Dr. Lynnette Caffey note reviewed. Consult for  TAVR. R and L heart cath reviewed. LVEDP minimally elevated after diuresis.  Await further CT scan workup for TAVR.   Donato Schultz, MD

## 2023-01-26 NOTE — Consult Note (Addendum)
Cardiology Consultation   Patient ID: Lawrence [name] MRN: [id number]; DOB: October 22, 1955  Admit date: 01/22/2023 Date of Consult: 01/26/2023  PCP:  Lawrence Soho, PA-C    HeartCare Providers Cardiologist:  Lawrence Magic, MD  Electrophysiologist:  Lawrence Lemming, MD  Advanced Heart Failure:  Lawrence Ancona, MD      STRUCTURAL HEART DISEASE CONSULT  Patient Profile:   67 y.o. male with a history of non-obstructive CAD noted on cardiac catheterization in 2019, chronic diastolic CHF, paroxysmal atrial fibrillation on Eliquis, severe aortic stenosis noted on recent Echo in 12/2022, hypertension, hyperlipidemia, type 2 diabetes mellitus with peripheral neuropathy, COPD, obstructive sleep apnea on CPAP, seizure disorder, and prior tobacco use (quit about 6 years ago) who is being seen 01/24/2023 for the evaluation of CHF at the request of Lawrence [name]  History of Present Illness:   Lawrence [name] is a 67 year old gentleman with a history of chronic diastolic dysfunction, paroxysmal atrial fibrillation on Eliquis, elevated BMI of 46, COPD, obstructive sleep apnea on CPAP, chronic back pain, seizure disorder, and an echocardiogram done recently which demonstrated severe aortic stenosis who was admitted with acute on chronic diastolic heart failure.  The patient has been followed for a long time by Lawrence [name] who last saw him on April 29 of this year.  He reported being short of breath with pretty minimal exertion.  He does use a walker because of chronic back pain issues.  He is quite deconditioned and has been working with physical therapy.  His weight was noted to be up by almost 20 pounds at that time as well.  His regimen of torsemide 80 mg twice daily, Farxiga 10 mg daily, and spironolactone 12.5 mg daily as well as Toprol and diltiazem were continued.  Metolazone 2.5 mg every Tuesday was added.  He was referred for an echocardiogram which demonstrated severe aortic stenosis.  There  were plans for referral to structural heart for further evaluation and recommendations regarding treatment.  Unfortunately he developed increasing chest pain and shortness of breath.  He presented to the emergency department on June 17.  A PE protocol CT was negative.  Troponins x 2 were negative and his BNP was not elevated at 25.  Other laboratories were relatively reassuring.  He was noted to be relatively hypoxic in the high 80s and low 90s.  Supplemental oxygen was administered.  He was stabilized medically.  He has diuresed close to 10 L with no increase in his creatinine.  He feels much much better.  He is able to lay flat without any issues.  He tells me at home he has a pretty low functional capacity.  He does like to walk around his house and property with his walker however he is found that he has had increasing shortness of breath with lower lower levels of exertion.  He has had some infrequent presyncope but no frank syncope.  He has had chest tightness at times but this is not a regular or routine occurrence.  He has noted some peripheral edema and again problems with laying flat on his back.  He underwent coronary angiography and right heart catheterization today which showed mild to moderate obstructive coronary artery disease, preserved cardiac output, and a mean RA pressure of 9 mmHg with a mean wedge pressure of 14 mmHg.  He is married.  He used to work at [employer].  He quit smoking sometime ago.   Past Medical History:  Diagnosis Date   Acquired dilation  of ascending aorta and aortic root (HCC)    40mm by echo 01/2021   Adenomatous colon polyp 2007   Anemia    Anxiety    Aortic stenosis    moderate AS by echo 01/2021   Asthma    BPH without obstruction/lower urinary tract symptoms 02/22/2017   Chronic diastolic (congestive) heart failure (HCC)    Chronic venous stasis 03/07/2019   COPD (chronic obstructive pulmonary disease) (HCC)    Coronary artery calcification seen on CAT  scan    Depression    Diabetic neuropathy (HCC) 09/11/2019   History of colon polyps 08/24/2018   Hypertension    Morbid obesity (HCC)    OSA (obstructive sleep apnea)    Pain due to onychomycosis of toenails of both feet 09/11/2019   Peripheral neuropathy 02/22/2017   Primary osteoarthritis, left shoulder 03/05/2017   PTSD (post-traumatic stress disorder)    Pure hypercholesterolemia    QT prolongation 03/07/2019   Seizures (HCC)    Shortness of breath    Sinus tachycardia 03/07/2019   Sleep apnea    CPAP   Type 2 diabetes mellitus with vascular disease (HCC) 09/11/2019    Past Surgical History:  Procedure Laterality Date   ENDOVENOUS ABLATION SAPHENOUS VEIN W/ LASER Right 08/20/2020   endovenous laser ablation right greater saphenous vein by Cari Caraway MD    ENDOVENOUS ABLATION SAPHENOUS VEIN W/ LASER Left 11/16/2022   endovenous laser ablation left greater saphenous vein by Cari Caraway MD   JOINT REPLACEMENT     left knee replacement x 2   KNEE ARTHROSCOPY Bilateral    LEFT HEART CATH AND CORONARY ANGIOGRAPHY N/A 01/17/2018   Procedure: LEFT HEART CATH AND CORONARY ANGIOGRAPHY;  Surgeon: Marykay Lex, MD;  Location: [facility] INVASIVE CV LAB;  Service: Cardiovascular;  Laterality: N/A;   PRESSURE SENSOR/CARDIOMEMS N/A 08/26/2021   Procedure: PRESSURE SENSOR/CARDIOMEMS;  Surgeon: Laurey Morale, MD;  Location: [facility] INVASIVE CV LAB;  Service: Cardiovascular;  Laterality: N/A;   RIGHT HEART CATH N/A 08/26/2021   Procedure: RIGHT HEART CATH;  Surgeon: Laurey Morale, MD;  Location: [facility] INVASIVE CV LAB;  Service: Cardiovascular;  Laterality: N/A;   TEE WITHOUT CARDIOVERSION N/A 08/26/2021   Procedure: TRANSESOPHAGEAL ECHOCARDIOGRAM (TEE);  Surgeon: Laurey Morale, MD;  Location: [facility] ENDOSCOPY;  Service: Cardiovascular;  Laterality: N/A;   UMBILICAL HERNIA REPAIR         Inpatient Medications: Scheduled Meds:  allopurinol  200 mg Oral BID   ARIPiprazole  5 mg Oral QHS    atorvastatin  40 mg Oral Daily   busPIRone  10 mg Oral TID   dapagliflozin propanediol  10 mg Oral Q breakfast   diltiazem  120 mg Oral Daily   fenofibrate  160 mg Oral Daily   fluticasone furoate-vilanterol  1 puff Inhalation Daily   And   umeclidinium bromide  1 puff Inhalation Daily   insulin aspart  0-15 Units Subcutaneous TID WC   insulin aspart  0-5 Units Subcutaneous QHS   [START ON 01/27/2023] insulin glargine-yfgn  30 Units Subcutaneous Daily   lacosamide  150 mg Oral BID   levETIRAcetam  500 mg Oral BID   metolazone  2.5 mg Oral Q Tue   metoprolol succinate  50 mg Oral Daily   mirtazapine  15 mg Oral QHS   prazosin  2 mg Oral QHS   pregabalin  150 mg Oral TID   sodium chloride flush  3 mL Intravenous Q12H   sodium chloride flush  3 mL Intravenous Q12H   spironolactone  12.5 mg Oral Daily   traZODone  200 mg Oral QHS   venlafaxine XR  225 mg Oral Q breakfast   Continuous Infusions:  PRN Meds: acetaminophen **OR** acetaminophen, albuterol, melatonin, ondansetron (ZOFRAN) IV, oxyCODONE  Allergies:    Allergies  Allergen Reactions   Vancomycin Other (See Comments)    "Red Man Syndrome" 02/02/17: possible cause for rash under both arms   Niacin And Related Other (See Comments)    Red man syndrome   Tubersol [Tuberculin, Ppd] Other (See Comments)    Reaction unknown   Doxycycline Rash and Other (See Comments)    Social History:   Social History   Socioeconomic History   Marital status: Significant Other    Spouse name: [name]   Number of children: 1   Years of education: Not on file   Highest education level: Associate degree: academic program  Occupational History   Occupation: retired    Comment: Building control surveyor  Tobacco Use   Smoking status: Former    Packs/day: 1.00    Years: 44.00    Additional pack years: 0.00    Total pack years: 44.00    Types: Cigarettes    Quit date: 03/07/2016    Years since quitting: 6.8    Passive exposure: Past    Smokeless tobacco: Never  Vaping Use   Vaping Use: Never used  Substance and Sexual Activity   Alcohol use: Yes    Comment: rare   Drug use: No   Sexual activity: Not on file  Other Topics Concern   Not on file  Social History Narrative   Lives with wife [name]   Right Handed   Drinks 1-2 cups caffeine   Social Determinants of Health   Financial Resource Strain: Medium Risk (06/08/2021)   Overall Financial Resource Strain (CARDIA)    Difficulty of Paying Living Expenses: Somewhat hard  Food Insecurity: Food Insecurity Present (01/23/2023)   Hunger Vital Sign    Worried About Running Out of Food in the Last Year: Sometimes true    Ran Out of Food in the Last Year: Sometimes true  Transportation Needs: No Transportation Needs (01/23/2023)   PRAPARE - Administrator, Civil Service (Medical): No    Lack of Transportation (Non-Medical): No  Physical Activity: Not on file  Stress: Not on file  Social Connections: Not on file  Intimate Partner Violence: Unknown (01/23/2023)   Humiliation, Afraid, Rape, and Kick questionnaire    Fear of Current or Ex-Partner: No    Emotionally Abused: No    Physically Abused: Patient unable to answer    Sexually Abused: No    Family History:    Family History  Problem Relation Age of Onset   CAD Maternal Grandfather    Diabetes Other    Diabetes Mellitus II Neg Hx    Colon cancer Neg Hx    Esophageal cancer Neg Hx    Inflammatory bowel disease Neg Hx    Liver disease Neg Hx    Pancreatic cancer Neg Hx    Rectal cancer Neg Hx    Stomach cancer Neg Hx    Sleep apnea Neg Hx      ROS:  Please see the history of present illness.   All other ROS reviewed and negative.     Physical Exam/Data:   Vitals:   01/26/23 1231 01/26/23 1254 01/26/23 1306 01/26/23 1333  BP: 119/85 122/78 (!) 128/106 (!) 132/97  Pulse:      Resp:      Temp:      TempSrc:      SpO2: 91% 91%    Weight:      Height:        Intake/Output Summary  (Last 24 hours) at 01/26/2023 1339 Last data filed at 01/26/2023 0350 Gross per 24 hour  Intake 360 ml  Output 2950 ml  Net -2590 ml      01/26/2023    5:00 AM 01/25/2023    3:00 AM 01/24/2023    3:45 AM  Last 3 Weights  Weight (lbs) 362 lb 361 lb 14.4 oz 368 lb 9.6 oz  Weight (kg) 164.202 kg 164.157 kg 167.196 kg     Body mass index is 46.48 kg/m.  General:  Well nourished, well developed, in no acute distress HEENT: normal, poor dentition Neck: no JVD Vascular: No carotid bruits; Distal pulses 2+ bilaterally: +2 left radial pulse and TR band on right radial Cardiac:  normal S1, S2; regular rate and rhythm very distant heart sounds and a 2/6 SEM Lungs: Coarse at bases Abd: soft, nontender, no hepatomegaly  Ext: Trace edema Musculoskeletal:  No deformities, BUE and BLE strength normal and equal Skin: warm and dry  Neuro:  CNs 2-12 intact, no focal abnormalities noted Psych:  Normal affect   EKG:  The EKG was personally reviewed and demonstrates: Sinus rhythm with first-degree AV block and incomplete right bundle branch block  Telemetry:  Telemetry was personally reviewed and demonstrates: Normal sinus rhythm with occasional PVCs  Relevant CV Studies:  TTE May 2024  1. Left ventricular ejection fraction, by estimation, is 60 to 65%. The  left ventricle has normal function. The left ventricle has no regional  wall motion abnormalities. There is mild left ventricular hypertrophy.  Left ventricular diastolic parameters  were normal.   2. Right ventricular systolic function is normal. The right ventricular  size is normal.   3. Left atrial size was moderately dilated.   4. The mitral valve is abnormal. No evidence of mitral valve  regurgitation. No evidence of mitral stenosis.   5. AS has progressed since TTE done 05/2022. The aortic valve was not  well visualized. There is moderate calcification of the aortic valve.  There is moderate thickening of the aortic valve. Aortic  valve  regurgitation is not visualized. Severe aortic  valve stenosis.   6. Aortic dilatation noted. There is mild dilatation of the aortic root,  measuring 38 mm. There is mild dilatation of the ascending aorta,  measuring 40 mm.   7. The inferior vena cava is normal in size with greater than 50%  respiratory variability, suggesting right atrial pressure of 3 mmHg.   Coronary and RHC 2024: 1.  Mild to moderate diffuse obstructive coronary artery disease without high-grade obstructions. 2.  Fick cardiac output of 9.7 L/min and Fick cardiac index of 3.5 L/min/m with the following hemodynamics:            Right atrial pressure mean of 9 mmHg            Right ventricular pressure 36/4 with an end-diastolic pressure of 16 mmHg            Mean wedge pressure of 14 mmHg            Pulmonary artery pressure of 34/15 with a mean of 24 mmHg  Laboratory Data:  High Sensitivity Troponin:   Recent Labs  Lab 01/22/23 2054 01/22/23 2348  TROPONINIHS 8 8     Chemistry Recent Labs  Lab 01/23/23 0538 01/24/23 0220 01/25/23 0023 01/26/23 0204 01/26/23 1156  NA 139 139 139 137 141  K 3.3* 3.1* 3.7 3.1* 3.6  CL 99 97* 97* 97*  --   CO2 30 28 32 30  --   GLUCOSE 92 154* 125* 149*  --   BUN 28* 17 19 22   --   CREATININE 0.90 0.77 0.85 0.93  --   CALCIUM 8.4* 8.9 9.0 8.8*  --   MG 2.0  --  2.1  --   --   GFRNONAA >60 >60 >60 >60  --   ANIONGAP 10 14 10 10   --     Recent Labs  Lab 01/23/23 0538  PROT 6.5  ALBUMIN 3.2*  AST 14*  ALT 19  ALKPHOS 72  BILITOT 0.5   Lipids No results for input(s): "CHOL", "TRIG", "HDL", "LABVLDL", "LDLCALC", "CHOLHDL" in the last 168 hours.  Hematology Recent Labs  Lab 01/22/23 2054 01/23/23 0538 01/24/23 0220 01/26/23 1156  WBC 13.0* 11.8* 9.6  --   RBC 4.80 4.59 4.57  --   HGB 14.1 13.5 13.4 14.6  HCT 44.2 41.9 41.6 43.0  MCV 92.1 91.3 91.0  --   MCH 29.4 29.4 29.3  --   MCHC 31.9 32.2 32.2  --   RDW 15.6* 15.7* 15.4  --   PLT 269 260  242  --    Thyroid No results for input(s): "TSH", "FREET4" in the last 168 hours.  BNP Recent Labs  Lab 01/22/23 2054  BNP 25.3    DDimer No results for input(s): "DDIMER" in the last 168 hours.   Radiology/Studies:  CARDIAC CATHETERIZATION  Result Date: 01/26/2023   Prox LAD to Mid LAD lesion is 40% stenosed.   Prox Cx to Mid Cx lesion is 20% stenosed.   Ost 2nd Mrg lesion is 20% stenosed.   Mid RCA lesion is 40% stenosed.   Prox Cx lesion is 20% stenosed. 1.  Mild to moderate diffuse obstructive coronary artery disease without high-grade obstructions. 2.  Fick cardiac output of 9.7 L/min and Fick cardiac index of 3.5 L/min/m with the following hemodynamics:  Right atrial pressure mean of 9 mmHg  Right ventricular pressure 36/4 with an end-diastolic pressure of 16 mmHg  Mean wedge pressure of 14 mmHg  Pulmonary artery pressure of 34/15 with a mean of 24 mmHg Recommendation: Continue evaluation for aortic valve intervention.   CT Angio Chest PE W/Cm &/Or Wo Cm  Result Date: 01/23/2023 CLINICAL DATA:  Chest pain or shortness breath. EXAM: CT ANGIOGRAPHY CHEST WITH CONTRAST TECHNIQUE: Multidetector CT imaging of the chest was performed using the standard protocol during bolus administration of intravenous contrast. Multiplanar CT image reconstructions and MIPs were obtained to evaluate the vascular anatomy. RADIATION DOSE REDUCTION: This exam was performed according to the departmental dose-optimization program which includes automated exposure control, adjustment of the mA and/or kV according to patient size and/or use of iterative reconstruction technique. CONTRAST:  75mL OMNIPAQUE IOHEXOL 350 MG/ML SOLN COMPARISON:  AP and lateral chest yesterday, CTA chest 10/26/2022, CTA chest 09/08/2018. FINDINGS: Cardiovascular: There is mild-to-moderate cardiomegaly with a left chamber predominance. Pulmonary veins are normal in caliber. There are three-vessel coronary artery calcifications in the proximal  LAD stenting again noted. There is lipomatous infiltration of the inter-atrial septum. The pulmonary trunk prominent but stable measuring 3 cm. There is IVC and hepatic vein reflux which may suggest tricuspid regurgitation or elevated  right heart pressures but there is no enlargement of the right ventricle compared to the left. No arterial embolic filling defect is seen. There is a linear calcification within a segmental artery to the posterior basal left lower lobe consistent with a chronic linear calcified thrombus. There are moderate to heavy calcifications in the aortic valve leaflets, mild aortic tortuosity and mild aortic and great vessel atherosclerosis. There is no aortic aneurysm. There is no great vessel or aortic stenosis or dissection. There is a normal variant brachiobicarotid trunk. Mediastinum/Nodes: There are no thyroid nodules or axillary adenopathy. Single mildly prominent azygoesophageal recess node measures 1.4 cm and is unchanged. There are scattered bilateral slightly prominent hilar lymph nodes, largest on the right is 1.1 cm in the posterior mid hilum, largest on the left is 1 cm short axis in the lower hilum. There is no new, further or enlarging adenopathy. There is lipomatosis of the superior mediastinum exaggerating its diameter. Small hiatal hernia without esophageal thickening. The trachea and central airways are clear. Lungs/Pleura: There is no pleural effusion or pneumothorax. Mild paraseptal emphysematous changes are noted in the lung apices. There is diffuse bronchial thickening, greater in the lower lobes, with scattered bilateral lower lobe subsegmental bronchial impactions. There is posterior atelectasis without evidence of bronchopneumonia. There is mosaic attenuation in the upper lobes most likely due to small airways disease with air trapping. No focal pneumonia is evident.  No pulmonary nodules. Upper Abdomen: No acute abnormality. There is a cyst in hepatic segment 5  measuring 3.9 cm and 1.2 Hounsfield units. The liver is mildly steatotic. This was seen previously. Musculoskeletal: There is extensive multilevel thoracic spine bridging enthesopathy. No acute osseous findings or aggressive lesions. An elongate lipoma is again noted in the right infraspinatus muscle. Review of the MIP images confirms the above findings. IMPRESSION: 1. No evidence of acute arterial embolic filling defect. 2. Chronic linear calcified thrombus in a segmental artery to the posterior basal left lower lobe. 3. Cardiomegaly with lipomatous infiltration of the inter-atrial septum. 4. Aortic and coronary artery atherosclerosis. 5. Prominent pulmonary trunk but no right ventricular enlargement. 6. IVC and hepatic vein reflux which may suggest tricuspid regurgitation or elevated right heart pressures. 7. COPD with bronchial thickening and scattered subsegmental bronchial impactions in the lower lobes. 8. Mosaic attenuation in the upper lobes most likely due to small airways disease with air trapping. No appreciable focal pneumonia. 9. Stable mildly prominent lymph nodes. 10. Small hiatal hernia. 11. Emphysema. Aortic Atherosclerosis (ICD10-I70.0) and Emphysema (ICD10-J43.9). Electronically Signed   By: Almira Bar M.D.   On: 01/23/2023 04:50   DG Chest 2 View  Result Date: 01/22/2023 CLINICAL DATA:  Pain and shortness of breath EXAM: CHEST - 2 VIEW COMPARISON:  10/26/2022 FINDINGS: Cardiac shadow is enlarged. The overall inspiratory effort is within normal limits. No focal infiltrate is seen. No effusion is noted. IMPRESSION: No acute abnormality noted. Electronically Signed   By: Alcide Clever M.D.   On: 01/22/2023 21:33     Assessment and Plan:   Acute on chronic diastolic heart failure: The patient has been vigorously diuresed with filling pressures that are within normal limits or slightly elevated.  Continue current medical therapy with Toprol, spironolactone, and Farxiga.  Severe aortic  stenosis: This is likely the driver of the patient's increasing lifestyle limiting dyspnea and present hospitalization for acute on chronic diastolic heart failure.  Will obtain a TAVR protocol CTA, Panorex given poor dentition, and plan on a outpatient cardiothoracic surgical evaluation  in regards to treatment of his severe symptomatic aortic stenosis.  Given his multiple comorbidities including elevated BMI, history of COPD, and poor functional capacity he likely will be judged to be best served by a transcatheter aortic valve replacement procedure.  He does have an incomplete right bundle branch block and so membranous septum length will be important if this were to be pursued.  Right IJ pacing may be advantageous.  Additionally given his body habitus left radial approach for secondary access should be considered.  Paroxysmal atrial fibrillation on Eliquis continue current therapy  4.  Elevated BMI: See discussion about potential TAVR access above.  Consider       GLP-1 receptor agonist as outpatient given history of diabetes.  5.  Type 2 diabetes: Continue Farxiga, atorvastatin, and aspirin.   Risk Assessment/Risk Scores:     New York Heart Association (NYHA) Functional Class NYHA Class IV  CHA2DS2-VASc Score = 5   This indicates a 7.2% annual risk of stroke. The patient's score is based upon: CHF History: 1 HTN History: 1 Diabetes History: 1 Stroke History: 0 Vascular Disease History: 1 Age Score: 1 Gender Score: 0    STS SCORE Procedure Type: Isolated AVR PERIOPERATIVE OUTCOME ESTIMATE % Operative Mortality 3.02% Morbidity & Mortality 12.4% Stroke 0.749% Renal Failure 3.13% Reoperation 2.81% Prolonged Ventilation 6.66% Deep Sternal Wound Infection 0.28% Long Hospital Stay (>14 days) 6.42% Short Hospital Stay (<6 days)* 37.4%     [facility] Cardiomyopathy Questionnaire     01/30/2023   10:04 AM  KCCQ-12  1 a. Ability to shower/bathe Moderately limited  1 b. Ability  to walk 1 block Other, Did not do  1 c. Ability to hurry/jog Other, Did not do  2. Edema feet/ankles/legs Every morning  3. Limited by fatigue All of the time  4. Limited by dyspnea 3+ times a week, not every day  5. Sitting up / on 3+ pillows 1-2 times a week  6. Limited enjoyment of life Limited quite a bit  7. Rest of life w/ symptoms Mostly dissatisfied  8 a. Participation in hobbies Limited quite a bit  8 b. Participation in chores Limited quite a bit  8 c. Visiting family/friends Limited quite a bit      For questions or updates, please contact [facility] Please consult [email or web address] for contact info under    Signed, Orbie Pyo, MD  01/26/2023 1:39 PM

## 2023-01-26 NOTE — Progress Notes (Signed)
   01/26/23 2000  BiPAP/CPAP/SIPAP  BiPAP/CPAP/SIPAP Pt Type Adult  BiPAP/CPAP/SIPAP Resmed  Reason BIPAP/CPAP not in use Non-compliant  MEWS Score/Color  MEWS Score 0  MEWS Score Color [place]

## 2023-01-26 NOTE — Care Management Important Message (Signed)
Important Message  Patient Details  Name: Lawrence [name] MRN: [id number] Date of Birth: [birth year]   Medicare Important Message Given:  Yes     Renie Ora 01/26/2023, 10:34 AM

## 2023-01-27 ENCOUNTER — Encounter (HOSPITAL_COMMUNITY): Payer: Self-pay | Admitting: Internal Medicine

## 2023-01-27 ENCOUNTER — Inpatient Hospital Stay (HOSPITAL_COMMUNITY): Payer: Medicare HMO

## 2023-01-27 DIAGNOSIS — I350 Nonrheumatic aortic (valve) stenosis: Secondary | ICD-10-CM

## 2023-01-27 DIAGNOSIS — E1165 Type 2 diabetes mellitus with hyperglycemia: Secondary | ICD-10-CM | POA: Diagnosis not present

## 2023-01-27 DIAGNOSIS — I5033 Acute on chronic diastolic (congestive) heart failure: Secondary | ICD-10-CM | POA: Diagnosis not present

## 2023-01-27 DIAGNOSIS — J9601 Acute respiratory failure with hypoxia: Secondary | ICD-10-CM | POA: Diagnosis not present

## 2023-01-27 DIAGNOSIS — Z794 Long term (current) use of insulin: Secondary | ICD-10-CM | POA: Diagnosis not present

## 2023-01-27 LAB — CBC
HCT: 44.9 % (ref 39.0–52.0)
Hemoglobin: 14.2 g/dL (ref 13.0–17.0)
MCH: 28.9 pg (ref 26.0–34.0)
MCHC: 31.6 g/dL (ref 30.0–36.0)
MCV: 91.4 fL (ref 80.0–100.0)
Platelets: 268 10*3/uL (ref 150–400)
RBC: 4.91 MIL/uL (ref 4.22–5.81)
RDW: 15.5 % (ref 11.5–15.5)
WBC: 10.3 10*3/uL (ref 4.0–10.5)
nRBC: 0 % (ref 0.0–0.2)

## 2023-01-27 LAB — GLUCOSE, CAPILLARY
Glucose-Capillary: 165 mg/dL — ABNORMAL HIGH (ref 70–99)
Glucose-Capillary: 125 mg/dL — ABNORMAL HIGH (ref 70–99)
Glucose-Capillary: 138 mg/dL — ABNORMAL HIGH (ref 70–99)
Glucose-Capillary: 170 mg/dL — ABNORMAL HIGH (ref 70–99)

## 2023-01-27 LAB — BASIC METABOLIC PANEL
Anion gap: 8 (ref 5–15)
BUN: 16 mg/dL (ref 8–23)
CO2: 30 mmol/L (ref 22–32)
Calcium: 8.9 mg/dL (ref 8.9–10.3)
Chloride: 99 mmol/L (ref 98–111)
Creatinine, Ser: 0.84 mg/dL (ref 0.61–1.24)
GFR, Estimated: >60 mL/min (ref >60)
Glucose, Bld: 112 mg/dL — ABNORMAL HIGH (ref 70–99)
Potassium: 4 mmol/L (ref 3.5–5.1)
Sodium: 137 mmol/L (ref 135–145)

## 2023-01-27 MED ORDER — IOHEXOL 350 MG/ML SOLN
115.0000 mL | Freq: Once | INTRAVENOUS | Status: AC | PRN
  Administered 2023-01-27: 115 mL via INTRAVENOUS

## 2023-01-27 MED ORDER — MORPHINE SULFATE (PF) 2 MG/ML IV SOLN
2.0000 mg | INTRAVENOUS | Status: DC | PRN
  Administered 2023-01-27 – 2023-02-08 (×18): 2 mg via INTRAVENOUS
  Filled 2023-01-27 (×19): qty 1

## 2023-01-27 MED ORDER — OXYCODONE HCL 5 MG PO TABS
5.0000 mg | ORAL_TABLET | Freq: Four times a day (QID) | ORAL | Status: AC | PRN
  Administered 2023-01-27 – 2023-01-28 (×4): 5 mg via ORAL
  Filled 2023-01-27 (×4): qty 1

## 2023-01-27 MED ORDER — PANTOPRAZOLE SODIUM 40 MG PO TBEC
40.0000 mg | DELAYED_RELEASE_TABLET | Freq: Two times a day (BID) | ORAL | Status: DC
  Administered 2023-01-27 – 2023-02-08 (×23): 40 mg via ORAL
  Filled 2023-01-27 (×23): qty 1

## 2023-01-27 MED ORDER — IVABRADINE HCL 5 MG PO TABS
10.0000 mg | ORAL_TABLET | Freq: Once | ORAL | Status: AC
  Administered 2023-01-27: 10 mg via ORAL
  Filled 2023-01-27: qty 2

## 2023-01-27 NOTE — Plan of Care (Signed)
Problem: Education: Goal: Knowledge of General Education information will improve Description: Including pain rating scale, medication(s)/side effects and non-pharmacologic comfort measures Outcome: Progressing   Problem: Health Behavior/Discharge Planning: Goal: Ability to manage health-related needs will improve Outcome: Progressing   Problem: Clinical Measurements: Goal: Ability to maintain clinical measurements within normal limits will improve Outcome: Progressing Goal: Will remain free from infection Outcome: Progressing Goal: Diagnostic test results will improve Outcome: Progressing Goal: Respiratory complications will improve Outcome: Progressing Goal: Cardiovascular complication will be avoided Outcome: Progressing   Problem: Activity: Goal: Risk for activity intolerance will decrease Outcome: Progressing   Problem: Nutrition: Goal: Adequate nutrition will be maintained Outcome: Progressing   Problem: Coping: Goal: Level of anxiety will decrease Outcome: Progressing   Problem: Elimination: Goal: Will not experience complications related to bowel motility Outcome: Progressing Goal: Will not experience complications related to urinary retention Outcome: Progressing   Problem: Pain Managment: Goal: General experience of comfort will improve Outcome: Progressing   Problem: Safety: Goal: Ability to remain free from injury will improve Outcome: Progressing   Problem: Skin Integrity: Goal: Risk for impaired skin integrity will decrease Outcome: Progressing   Problem: Education: Goal: Ability to describe self-care measures that may prevent or decrease complications (Diabetes Survival Skills Education) will improve Outcome: Progressing Goal: Individualized Educational Video(s) Outcome: Progressing   Problem: Coping: Goal: Ability to adjust to condition or change in health will improve Outcome: Progressing   Problem: Fluid Volume: Goal: Ability to  maintain a balanced intake and output will improve Outcome: Progressing   Problem: Health Behavior/Discharge Planning: Goal: Ability to identify and utilize available resources and services will improve Outcome: Progressing Goal: Ability to manage health-related needs will improve Outcome: Progressing   Problem: Metabolic: Goal: Ability to maintain appropriate glucose levels will improve Outcome: Progressing   Problem: Nutritional: Goal: Maintenance of adequate nutrition will improve Outcome: Progressing Goal: Progress toward achieving an optimal weight will improve Outcome: Progressing   Problem: Skin Integrity: Goal: Risk for impaired skin integrity will decrease Outcome: Progressing   Problem: Tissue Perfusion: Goal: Adequacy of tissue perfusion will improve Outcome: Progressing   Problem: Education: Goal: Understanding of CV disease, CV risk reduction, and recovery process will improve Outcome: Progressing Goal: Individualized Educational Video(s) Outcome: Progressing   Problem: Activity: Goal: Ability to return to baseline activity level will improve Outcome: Progressing   Problem: Cardiovascular: Goal: Ability to achieve and maintain adequate cardiovascular perfusion will improve Outcome: Progressing Goal: Vascular access site(s) Level 0-1 will be maintained Outcome: Progressing   Problem: Health Behavior/Discharge Planning: Goal: Ability to safely manage health-related needs after discharge will improve Outcome: Progressing   Problem: Education: Goal: Knowledge of General Education information will improve Description: Including pain rating scale, medication(s)/side effects and non-pharmacologic comfort measures Outcome: Progressing   Problem: Health Behavior/Discharge Planning: Goal: Ability to manage health-related needs will improve Outcome: Progressing   Problem: Clinical Measurements: Goal: Ability to maintain clinical measurements within normal  limits will improve Outcome: Progressing Goal: Will remain free from infection Outcome: Progressing Goal: Diagnostic test results will improve Outcome: Progressing Goal: Respiratory complications will improve Outcome: Progressing Goal: Cardiovascular complication will be avoided Outcome: Progressing   Problem: Activity: Goal: Risk for activity intolerance will decrease Outcome: Progressing   Problem: Nutrition: Goal: Adequate nutrition will be maintained Outcome: Progressing   Problem: Coping: Goal: Level of anxiety will decrease Outcome: Progressing   Problem: Elimination: Goal: Will not experience complications related to bowel motility Outcome: Progressing Goal: Will not experience complications related to urinary retention  Outcome: Progressing   Problem: Pain Managment: Goal: General experience of comfort will improve Outcome: Progressing   Problem: Safety: Goal: Ability to remain free from injury will improve Outcome: Progressing   Problem: Skin Integrity: Goal: Risk for impaired skin integrity will decrease Outcome: Progressing   Problem: Education: Goal: Ability to describe self-care measures that may prevent or decrease complications (Diabetes Survival Skills Education) will improve Outcome: Progressing Goal: Individualized Educational Video(s) Outcome: Progressing   Problem: Coping: Goal: Ability to adjust to condition or change in health will improve Outcome: Progressing   Problem: Fluid Volume: Goal: Ability to maintain a balanced intake and output will improve Outcome: Progressing   Problem: Health Behavior/Discharge Planning: Goal: Ability to identify and utilize available resources and services will improve Outcome: Progressing Goal: Ability to manage health-related needs will improve Outcome: Progressing   Problem: Metabolic: Goal: Ability to maintain appropriate glucose levels will improve Outcome: Progressing   Problem:  Nutritional: Goal: Maintenance of adequate nutrition will improve Outcome: Progressing Goal: Progress toward achieving an optimal weight will improve Outcome: Progressing   Problem: Skin Integrity: Goal: Risk for impaired skin integrity will decrease Outcome: Progressing   Problem: Tissue Perfusion: Goal: Adequacy of tissue perfusion will improve Outcome: Progressing   Problem: Education: Goal: Understanding of CV disease, CV risk reduction, and recovery process will improve Outcome: Progressing Goal: Individualized Educational Video(s) Outcome: Progressing   Problem: Activity: Goal: Ability to return to baseline activity level will improve Outcome: Progressing   Problem: Cardiovascular: Goal: Ability to achieve and maintain adequate cardiovascular perfusion will improve Outcome: Progressing Goal: Vascular access site(s) Level 0-1 will be maintained Outcome: Progressing   Problem: Health Behavior/Discharge Planning: Goal: Ability to safely manage health-related needs after discharge will improve Outcome: Progressing

## 2023-01-27 NOTE — Progress Notes (Addendum)
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 He will need a new IV and would repeat the study. They are unable to accommodate over the weekend (no one here this weekend whos cardiac trained to [facility]) so would need to be repeated on Monday. TAVR team aware; Carlean Jews will review on Monday.

## 2023-01-27 NOTE — Progress Notes (Addendum)
Progress Note  Patient Name: Lawrence [name] Date of Encounter: 01/27/2023  Primary Cardiologist: Armanda Magic, MD  Subjective   Developed gripping/sharp intermittent chest pain around 7:40am. This has been going on 6-7 months almost daily per SO. This morning had a big breakfast before it started.  Not worse with inspiration, palpation, movement, exertion. Takes Tylenol arthritis at home.  Got Tylenol + oxycodone earlier this AM and pain improved to a mild dull ache then came back. No SOB.  Cath reviewed, no obstructive lesions.  Inpatient Medications    Scheduled Meds:  allopurinol  200 mg Oral BID   ARIPiprazole  5 mg Oral QHS   atorvastatin  40 mg Oral Daily   busPIRone  10 mg Oral TID   dapagliflozin propanediol  10 mg Oral Q breakfast   diltiazem  120 mg Oral Daily   fenofibrate  160 mg Oral Daily   fluticasone furoate-vilanterol  1 puff Inhalation Daily   And   umeclidinium bromide  1 puff Inhalation Daily   insulin aspart  0-15 Units Subcutaneous TID WC   insulin aspart  0-5 Units Subcutaneous QHS   insulin glargine-yfgn  30 Units Subcutaneous Daily   lacosamide  150 mg Oral BID   levETIRAcetam  500 mg Oral BID   metolazone  2.5 mg Oral Q Tue   metoprolol succinate  50 mg Oral Daily   mirtazapine  15 mg Oral QHS   prazosin  2 mg Oral QHS   pregabalin  150 mg Oral TID   sodium chloride flush  3 mL Intravenous Q12H   sodium chloride flush  3 mL Intravenous Q12H   spironolactone  12.5 mg Oral Daily   traZODone  200 mg Oral QHS   venlafaxine XR  225 mg Oral Q breakfast   Continuous Infusions:  PRN Meds: acetaminophen **OR** acetaminophen, albuterol, melatonin, ondansetron (ZOFRAN) IV   Vital Signs    Vitals:   01/26/23 1506 01/26/23 1942 01/27/23 0553 01/27/23 0736  BP: 117/85 113/79 124/72 112/71  Pulse:  87 87 (!) 102  Resp:  18 16 16   Temp:  98.3 F (36.8 C) 97.7 F (36.5 C)   TempSrc:  Oral Oral   SpO2:  94% 96%   Weight:      Height:         Intake/Output Summary (Last 24 hours) at 01/27/2023 0832 Last data filed at 01/26/2023 2301 Gross per 24 hour  Intake --  Output 1100 ml  Net -1100 ml      01/26/2023    5:00 AM 01/25/2023    3:00 AM 01/24/2023    3:45 AM  Last 3 Weights  Weight (lbs) 362 lb 361 lb 14.4 oz 368 lb 9.6 oz  Weight (kg) 164.202 kg 164.157 kg 167.196 kg     Telemetry    NSR/sinus tach with occ PACs, PVCs - Personally Reviewed  ECG    Sinus tach 102bpm first degree AVB, RBBB morphology, nonspecific ST upsloping III, avF (d/w Dr. Anne Fu felt benign) - Personally Reviewed  Physical Exam   GEN: No acute distress. Morbidly obese. HEENT: Normocephalic, atraumatic, sclera non-icteric. Neck: No JVD or bruits. Cardiac: mild tachycardia, occ ectopy, 2/6 SEM, no rubs rubs or gallops.  Respiratory: Clear to auscultation bilaterally. Breathing is unlabored. GI: Soft, nontender, non-distended, BS +x 4. MS: no deformity. Extremities: No clubbing or cyanosis. Chronic hyperpigmentation changes of BLE, mild LE edema. Right radial cath site without hematoma or ecchymosis; good pulse. Neuro:  AAOx3. Follows commands.  Psych:  Responds to questions appropriately with a normal affect.  Labs    High Sensitivity Troponin:   Recent Labs  Lab 01/22/23 2054 01/22/23 2348  TROPONINIHS 8 8      Cardiac EnzymesNo results for input(s): "TROPONINI" in the last 168 hours. No results for input(s): "TROPIPOC" in the last 168 hours.   Chemistry Recent Labs  Lab 01/23/23 0538 01/24/23 0220 01/25/23 0023 01/26/23 0204 01/26/23 1146 01/26/23 1156 01/26/23 1159 01/27/23 0238  NA 139   < > 139 137   < > 141 141 137  K 3.3*   < > 3.7 3.1*   < > 3.6 3.7 4.0  CL 99   < > 97* 97*  --   --   --  99  CO2 30   < > 32 30  --   --   --  30  GLUCOSE 92   < > 125* 149*  --   --   --  112*  BUN 28*   < > 19 22  --   --   --  16  CREATININE 0.90   < > 0.85 0.93  --   --   --  0.84  CALCIUM 8.4*   < > 9.0 8.8*  --   --   --   8.9  PROT 6.5  --   --   --   --   --   --   --   ALBUMIN 3.2*  --   --   --   --   --   --   --   AST 14*  --   --   --   --   --   --   --   ALT 19  --   --   --   --   --   --   --   ALKPHOS 72  --   --   --   --   --   --   --   BILITOT 0.5  --   --   --   --   --   --   --   GFRNONAA >60   < > >60 >60  --   --   --  >60  ANIONGAP 10   < > 10 10  --   --   --  8   < > = values in this interval not displayed.     Hematology Recent Labs  Lab 01/23/23 0538 01/24/23 0220 01/26/23 1146 01/26/23 1156 01/26/23 1159 01/27/23 0238  WBC 11.8* 9.6  --   --   --  10.3  RBC 4.59 4.57  --   --   --  4.91  HGB 13.5 13.4   < > 14.6 15.3 14.2  HCT 41.9 41.6   < > 43.0 45.0 44.9  MCV 91.3 91.0  --   --   --  91.4  MCH 29.4 29.3  --   --   --  28.9  MCHC 32.2 32.2  --   --   --  31.6  RDW 15.7* 15.4  --   --   --  15.5  PLT 260 242  --   --   --  268   < > = values in this interval not displayed.    BNP Recent Labs  Lab 01/22/23 2054  BNP 25.3     DDimer No results for input(s): "DDIMER" in the last 168  hours.   Radiology    CARDIAC CATHETERIZATION  Result Date: 01/26/2023   Prox LAD to Mid LAD lesion is 40% stenosed.   Prox Cx to Mid Cx lesion is 20% stenosed.   Ost 2nd Mrg lesion is 20% stenosed.   Mid RCA lesion is 40% stenosed.   Prox Cx lesion is 20% stenosed. 1.  Mild to moderate diffuse obstructive coronary artery disease without high-grade obstructions. 2.  Fick cardiac output of 9.7 L/min and Fick cardiac index of 3.5 L/min/m with the following hemodynamics:  Right atrial pressure mean of 9 mmHg  Right ventricular pressure 36/4 with an end-diastolic pressure of 16 mmHg  Mean wedge pressure of 14 mmHg  Pulmonary artery pressure of 34/15 with a mean of 24 mmHg Recommendation: Continue evaluation for aortic valve intervention.    Cardiac Studies   Cath 01/26/23   Prox LAD to Mid LAD lesion is 40% stenosed.   Prox Cx to Mid Cx lesion is 20% stenosed.   Ost 2nd Mrg lesion is  20% stenosed.   Mid RCA lesion is 40% stenosed.   Prox Cx lesion is 20% stenosed.   1.  Mild to moderate diffuse obstructive coronary artery disease without high-grade obstructions. 2.  Fick cardiac output of 9.7 L/min and Fick cardiac index of 3.5 L/min/m with the following hemodynamics:            Right atrial pressure mean of 9 mmHg            Right ventricular pressure 36/4 with an end-diastolic pressure of 16 mmHg            Mean wedge pressure of 14 mmHg            Pulmonary artery pressure of 34/15 with a mean of 24 mmHg   Recommendation: Continue evaluation for aortic valve intervention.  2D echo 12/22/22   1. Left ventricular ejection fraction, by estimation, is 60 to 65%. The  left ventricle has normal function. The left ventricle has no regional  wall motion abnormalities. There is mild left ventricular hypertrophy.  Left ventricular diastolic parameters  were normal.   2. Right ventricular systolic function is normal. The right ventricular  size is normal.   3. Left atrial size was moderately dilated.   4. The mitral valve is abnormal. No evidence of mitral valve  regurgitation. No evidence of mitral stenosis.   5. AS has progressed since TTE done 05/2022. The aortic valve was not  well visualized. There is moderate calcification of the aortic valve.  There is moderate thickening of the aortic valve. Aortic valve  regurgitation is not visualized. Severe aortic  valve stenosis.   6. Aortic dilatation noted. There is mild dilatation of the aortic root,  measuring 38 mm. There is mild dilatation of the ascending aorta,  measuring 40 mm.   7. The inferior vena cava is normal in size with greater than 50%  respiratory variability, suggesting right atrial pressure of 3 mmHg.   Patient Profile     67 y.o. male with a history of non-obstructive CAD noted on cardiac catheterization in 2019, chronic diastolic CHF, paroxysmal atrial fibrillation on Eliquis, severe aortic  stenosis noted on recent Echo in 12/2022, hypertension, hyperlipidemia, type 2 diabetes mellitus with peripheral neuropathy, COPD, obstructive sleep apnea on CPAP, seizure disorder, and prior tobacco use (quit about 6 years ago) who is being seen 01/24/2023 for the evaluation of CHF at the request of Dr. Madelyn Flavors.   Assessment &  Plan    1. Acute hypoxic respiratory failure in setting of acute on chronic diastolic CHF, severe aortic stenosis, HTN with some hypotension as well - recent echo on 12/22/2022 showed 60-65% with normal wall motion and diastolic parameters but progression of known aortic stenosis to the severe range. Cardiomems on 01/16/2023 was mildly elevated suggestive of fluid accumulation. - diuresed well with IV Lasix (also takes metolazone once a week on Tuesdays), stopped 01/26/23 due to soft BP in anticipation of cath - volume status is challenging with obesity - cath findings with nonobstructive CAD, ongoing w/u for TAVR by structural heart team - will await further recs from MD regarding ongoing diuretic management given cath dye load then need for cor CTs today   2. Chest Pain, non-obstructive CAD, HLD - chronic intermittent atypical CP for 6-7 months - negative troponins for similar discomfort on admission, EKG reviewed with Dr. Anne Fu and felt reassuring, cath also with nonobstructive disease, not entirely typical for aortic stenosis - analgesia with Tylenol, oxycodone helped - with recurrent pain, d/w Dr. Anne Fu -> will trial dose of morphine - add PPI - continue statin   3. H/o PAF (on OAC PTA) with frequent PVCs on telemetry - Eliquis was on hold for cath, await input from MD/TAVR team regarding anticoagulation resumption (OAC versus heparin?) - occasional PVCs on telemetry - continue BB as tolerated - lytes OK   4. Hypokalemia - improved, likely will need standing KCl added if/when Lasix gets resumed   Otherwise, per primary team: - COPD - Type 2 diabetes  mellitus -> insulin was temporarily adjusted for cath - Seizure disorder - Anxiety and depression - OSA on CPAP    For questions or updates, please contact [facility] Please consult [email or web address] for contact info under Cardiology/STEMI.  Signed, Laurann Montana, PA-C 01/27/2023, 8:32 AM    Personally seen and examined. Agree with above.  Distant HS, 2/6 SM, lungs clear.  Chest pain --Morphine, No ischemic changes, cath yesterday non obstructive dz. Feels better. A bit sleepy from med. Spoke to wife  Aortic stenosis --severe (would like to avoid NTG) --Getting TAVR workup  Donato Schultz, MD

## 2023-01-27 NOTE — Progress Notes (Signed)
Occupational Therapy Treatment Patient Details Name: Lawrence [name] MRN: [id number] DOB: 10-12-1955 Today's Date: 01/27/2023   History of present illness Pt is a 67 y/o male who presents 01/22/23 with SOB and intermittent L sided CP for ~2 weeks PTA. R/L heart cath scheduled for 6/20 while inpatient. S/P heart cath 6/20. PMH significant for HTN, CHF, PAF on Eliquis, aortic stenosis, CAD, CVA, DM II, COPD, AAA.   OT comments  Pt supine in bed and agreeable to try therapy.   Patient reports getting morphine for chest pain earlier this morning, appears groggy and slightly lethargic.  He is oriented but demonstrates slow processing and requires verbal cueing.  He denies pain during session, but requires increased physical assistance.  Completing bed mobility with min-mod assist +2, transfers using RW with mod assist +2 and ADLs with setup to max assist.  His spouse is at his side and supportive.  She reports typically he does not need assist to stand but has hospital bed and lift chair that assist. Based on performance today, would recommend continue OT acutely and after dc at [facility] level (pending progress, if pt does not progress acutely may need to look into short term rehab options).    Recommendations for follow up therapy are one component of a multi-disciplinary discharge planning process, led by the attending physician.  Recommendations may be updated based on patient status, additional functional criteria and insurance authorization.    Assistance Recommended at Discharge Intermittent Supervision/Assistance  Patient can return home with the following  A lot of help with bathing/dressing/bathroom;Assistance with cooking/housework;Direct supervision/assist for medications management;Direct supervision/assist for financial management;Assist for transportation;Help with stairs or ramp for entrance;A lot of help with walking and/or transfers   Equipment Recommendations  None recommended by OT     Recommendations for Other Services      Precautions / Restrictions Precautions Precautions: Fall Restrictions Weight Bearing Restrictions: No       Mobility Bed Mobility Overal bed mobility: Needs Assistance Bed Mobility: Supine to Sit, Sit to Supine     Supine to sit: Min assist, +2 for physical assistance, HOB elevated Sit to supine: Mod assist, +2 for physical assistance   General bed mobility comments: using rails with min assist +2 to EOB and mod assist +2 to return supine    Transfers Overall transfer level: Needs assistance Equipment used: Rolling walker (2 wheels) Transfers: Sit to/from Stand Sit to Stand: Mod assist, +2 physical assistance, +2 safety/equipment           General transfer comment: multiple attempts to stand from elevated EOB with mod assist +2 to power up and steady, limited toerlance in standing     Balance Overall balance assessment: Needs assistance Sitting-balance support: No upper extremity supported, Feet supported Sitting balance-Leahy Scale: Fair     Standing balance support: Bilateral upper extremity supported, During functional activity Standing balance-Leahy Scale: Poor Standing balance comment: reliant on RW support                           ADL either performed or assessed with clinical judgement   ADL Overall ADL's : Needs assistance/impaired     Grooming: Set up;Sitting;Wash/dry face;Oral care Grooming Details (indicate cue type and reason): EOB         Upper Body Dressing : Set up;Sitting   Lower Body Dressing: +2 for physical assistance;Maximal assistance;Sit to/from stand Lower Body Dressing Details (indicate cue type and reason): assist forsocks, mod assist +  2 to stand  today and relies on BUE support             Functional mobility during ADLs: Moderate assistance;+2 for physical assistance;+2 for safety/equipment;Rolling walker (2 wheels)      Extremity/Trunk Assessment               Vision       Perception     Praxis      Cognition Arousal/Alertness: Lethargic, Suspect due to medications Behavior During Therapy: Flat affect Overall Cognitive Status: Impaired/Different from baseline Area of Impairment: Problem solving                             Problem Solving: Slow processing, Requires verbal cues General Comments: pt oriented but reports feeling a little off from mophine given this am. slow processing and requires cueing.        Exercises      Shoulder Instructions       General Comments VSS on 2L [place], HR 90s; moment of increased HR to 160 with oral care but recovers quickly- RN notified    Pertinent Vitals/ Pain       Pain Assessment Pain Assessment: No/denies pain  Home Living                                          Prior Functioning/Environment              Frequency  Min 1X/week        Progress Toward Goals  OT Goals(current goals can now be found in the care plan section)  Progress towards OT goals: Not progressing toward goals - comment (lethargy)  Acute Rehab OT Goals Patient Stated Goal: none stated Time For Goal Achievement: 02/08/23 Potential to Achieve Goals: Fair  Plan Discharge plan remains appropriate;Frequency remains appropriate    Co-evaluation    PT/OT/SLP Co-Evaluation/Treatment: Yes Reason for Co-Treatment: For patient/therapist safety;To address functional/ADL transfers   OT goals addressed during session: ADL's and self-care      AM-PAC OT "6 Clicks" Daily Activity     Outcome Measure   Help from another person eating meals?: None Help from another person taking care of personal grooming?: A Little Help from another person toileting, which includes using toliet, bedpan, or urinal?: A Lot Help from another person bathing (including washing, rinsing, drying)?: A Lot Help from another person to put on and taking off regular upper body clothing?: A Little Help from  another person to put on and taking off regular lower body clothing?: A Lot 6 Click Score: 16    End of Session Equipment Utilized During Treatment: Gait belt;Rolling walker (2 wheels);Oxygen  OT Visit Diagnosis: Other abnormalities of gait and mobility (R26.89);Unsteadiness on feet (R26.81);Muscle weakness (generalized) (M62.81)   Activity Tolerance Patient limited by fatigue   Patient Left in bed;with call bell/phone within reach;with bed alarm set;with family/visitor present   Nurse Communication Mobility status        Time: 1324-4010 OT Time Calculation (min): 26 min  Charges: OT General Charges $OT Visit: 1 Visit OT Treatments $Self Care/Home Management : 8-22 mins  Barry Brunner, OT Acute Rehabilitation Services Office [phone]   Chancy Milroy 01/27/2023, 1:14 PM

## 2023-01-27 NOTE — Progress Notes (Signed)
Physical Therapy Treatment Patient Details Name: Lawrence [name] MRN: [id number] DOB: 16-Sep-1955 Today's Date: 01/27/2023   History of Present Illness Pt is a 67 y/o male who presents 01/22/23 with SOB and intermittent L sided CP for ~2 weeks PTA. R/L heart cath scheduled for 6/20 while inpatient. S/P heart cath 6/20. PMH significant for HTN, CHF, PAF on Eliquis, aortic stenosis, CAD, CVA, DM II, COPD, AAA.    PT Comments    Pt was seen with assistance to stand and move on and off bed.  His level of help needed is much higher today, as well as requiring closer monitoring from his cath procedure yesterday.  Had intermittent chest pain and morphine, nursing and MD ok'd visit.  BP was 124/81 supine and 116/70 standing.  Sats were 95% at rest, dropped in sitting once with 84% read and pulse 160, but back to baseline upon stopping use of UE's.  Nursing aware of the issue and feels monitor is reflecting UE use.  Follow along with MD approval and will continue to see how pt progresses as home health was initial request for therapy follow up and may need rehab stay if pt is continuing to have greater need for physical assistance.   Recommendations for follow up therapy are one component of a multi-disciplinary discharge planning process, led by the attending physician.  Recommendations may be updated based on patient status, additional functional criteria and insurance authorization.  Follow Up Recommendations       Assistance Recommended at Discharge Intermittent Supervision/Assistance  Patient can return home with the following A little help with walking and/or transfers;A little help with bathing/dressing/bathroom;Assistance with cooking/housework;Assist for transportation;Help with stairs or ramp for entrance   Equipment Recommendations  None recommended by PT    Recommendations for Other Services       Precautions / Restrictions Precautions Precautions: Fall Precaution Comments: monitor HR  and sats Restrictions Weight Bearing Restrictions: No     Mobility  Bed Mobility Overal bed mobility: Needs Assistance Bed Mobility: Supine to Sit, Sit to Supine     Supine to sit: Min assist, Mod assist, +2 for physical assistance, +2 for safety/equipment Sit to supine: Mod assist, +2 for physical assistance, +2 for safety/equipment   General bed mobility comments: help with trunk out of bed and legs back to bed but this is PLOF    Transfers Overall transfer level: Needs assistance Equipment used: Rolling walker (2 wheels) Transfers: Sit to/from Stand Sit to Stand: Mod assist, +2 physical assistance, +2 safety/equipment           General transfer comment: requires a significant assist to stand esp last attempt    Ambulation/Gait               General Gait Details: deferred   Stairs             Wheelchair Mobility    Modified Rankin (Stroke Patients Only)       Balance                                            Cognition Arousal/Alertness: Lethargic Behavior During Therapy: Flat affect Overall Cognitive Status: Impaired/Different from baseline Area of Impairment: Problem solving, Awareness                           Awareness: Intellectual Problem  Solving: Slow processing, Requires verbal cues, Requires tactile cues General Comments: has had morphine, resulting in difficulty with standing and controlling balance, sig more help        Exercises      General Comments General comments (skin integrity, edema, etc.): 2L O2, requires for sat maintenance, had elevation of HR to 160 and sat 84% then recovered and returned to baseline.  Nsg aware, feels the change was from moving RUE      Pertinent Vitals/Pain Pain Assessment Pain Assessment: No/denies pain    Home Living                          Prior Function            PT Goals (current goals can now be found in the care plan section) Progress  towards PT goals: Not progressing toward goals - comment    Frequency    Min 1X/week      PT Plan Current plan remains appropriate    Co-evaluation   Reason for Co-Treatment: For patient/therapist safety;To address functional/ADL transfers   OT goals addressed during session: ADL's and self-care      AM-PAC PT "6 Clicks" Mobility   Outcome Measure  Help needed turning from your back to your side while in a flat bed without using bedrails?: A Little Help needed moving from lying on your back to sitting on the side of a flat bed without using bedrails?: Total Help needed moving to and from a bed to a chair (including a wheelchair)?: Total Help needed standing up from a chair using your arms (e.g., wheelchair or bedside chair)?: Total Help needed to walk in hospital room?: Total Help needed climbing 3-5 steps with a railing? : Total 6 Click Score: 8    End of Session Equipment Utilized During Treatment: Oxygen Activity Tolerance: Treatment limited secondary to medical complications (Comment) Patient left: in bed;with call bell/phone within reach;with bed alarm set;with family/visitor present Nurse Communication: Mobility status;Other (comment) (need for O2) PT Visit Diagnosis: Unsteadiness on feet (R26.81);Muscle weakness (generalized) (M62.81);Difficulty in walking, not elsewhere classified (R26.2)     Time: 1610-9604 PT Time Calculation (min) (ACUTE ONLY): 26 min  Charges:  $Therapeutic Activity: 8-22 mins        Ivar Drape 01/27/2023, 2:22 PM  Samul Dada, PT PhD Acute Rehab Dept. Number: [facility] [id number] and [facility] [phone]

## 2023-01-27 NOTE — Progress Notes (Signed)
PROGRESS NOTE GLENWOOD REVOIR  FBP:[id number] DOB: 03-26-1956 DOA: 01/22/2023 PCP: [name], PA-C   Brief Narrative/Hospital Course: 67 y.o.m w/ hypertension,CHF, PAF on Eliquis, aortic stenosis, CAD, CVA, diabetes mellitus type 2, COPD, AAA, prior history of tobacco abuse, morbid obesity, and OSA on CPAP presented with complaints of shortness of breath. He was intermittently having left-sided chest pain for the last 2 weeks, radiating into his neck and shoulder, recently had an echocardiogram done on 5/16 which noted EF to be 60 -65% with normal diastolic parameters.  Plan was for patient to have a right and left heart cath on the June 25.He had also may notice some mild redness of his lower extremities.    In ED: afebrile with pulse 89-111, RR 16-31,BP 91/47-130/112, O2 saturations noted to be as low as 89% with improvement ON 4 L of [place],Labs significant for WBC 13 >-11.8, potassium 3.3, BUN 24-> 28, creatinine 1->0.9, calcium 8.6->.8.4, BNP 25.3, high-sensitivity troponin negative x 2.  Cxr-no acute abnormality.  CT angiogram of the chest have been obtained which showed no evidence of a acute arterial embolic filling defect, but did note chronic linear calcified thrombus in the segmental artery of the basal left lower lobe. Patient was admitted for acute respiratory hypoxia in the setting of COPD exacerbation ABG showed no CO2 retention but said pCO2 in 70s  Subjective: Patient mentioned that he was feeling much better.  Denies any chest pain shortness of breath.  No nausea or vomiting.  Assessment and Plan:   Acute respiratory failure with hypoxia and hypercapnia/dyspnea on exertion/Chest pain: Patient requiring 4 L of oxygen by nasal cannula.  Typically does not use oxygen at home. Symptoms were likely due to acute diastolic CHF as well as severe aortic stenosis. CT angiogram did not show any PE COVID-19 and respiratory viral panel were negative. He is on his home inhaler treatments  as well. Underwent cardiac catheterization yesterday which did not show any CAD. Chest pain is likely noncardiac in etiology.  Continue PPI.  Acute on chronic Diastolic congestive heart failure Echocardiogram from May showed EF to be 60 to 65%.  Likely has diastolic dysfunction. Noted to be fluid overloaded.   Patient was diuresed.  Taken off of IV furosemide.  Now just on spironolactone and metolazone. Defer diuretics to cardiology. Strict ins and outs and daily weights. Cardiology is following.   Hypokalemia: Potassium is normal.   COPD Continue home medications.   Type 2 diabetes mellitus with long-term use of insulin: HbA1c 6.4.  Continue SSI, Farxiga, glargine.  Monitor CBGs.   Paroxysmal atrial fibrillation  Rate controlled on Cardizem.  Eliquis was placed on hold for cardiac catheterization.  Defer resumption to cardiology.   Essential hypertension: Soft blood pressures noted.  He is on multiple antihypertensives including diltiazem, metoprolol, Minipress.  Also on diuretics including furosemide and spironolactone. Defer to cardiology.   Anxiety and depression Mood stable continue as BuSpar, Abilify, venlafaxine   Seizure disorder Continue home Keppra and lacosamide  Obstructive sleep apnea Continue CPAP  Morbid obesity Estimated body mass index is 46.48 kg/m as calculated from the following:   Height as of this encounter: 6\' 2"  (1.88 m).   Weight as of this encounter: 164.2 kg.  DVT prophylaxis: Eliquis on hold.  Currently on SCDs. Code Status: DNR Family Communication: Discussed with patient.   Dispo: Home health recommended by physical therapy.  Objective:  Vitals:   01/26/23 1942 01/27/23 0553 01/27/23 0736 01/27/23 0859  BP: 113/79 124/72 112/71  134/85  Pulse: 87 87 (!) 102   Resp: 18 16 16 16   Temp: 98.3 F (36.8 C) 97.7 F (36.5 C)    TempSrc: Oral Oral    SpO2: 94% 96%    Weight:      Height:       Weight change:   Physical  Examination:  General appearance: Awake alert.  In no distress Resp: Clear to auscultation bilaterally.  Normal effort Cardio: S1-S2 is normal regular.  No S3-S4.  No rubs murmurs or bruit GI: Abdomen is soft.  Nontender nondistended.  Bowel sounds are present normal.  No masses organomegaly Extremities: Improvement in pedal edema noted. Neurologic: Alert and oriented x3.  No focal neurological deficits.     Medications:  Scheduled Meds:  allopurinol  200 mg Oral BID   ARIPiprazole  5 mg Oral QHS   atorvastatin  40 mg Oral Daily   busPIRone  10 mg Oral TID   dapagliflozin propanediol  10 mg Oral Q breakfast   diltiazem  120 mg Oral Daily   fenofibrate  160 mg Oral Daily   fluticasone furoate-vilanterol  1 puff Inhalation Daily   And   umeclidinium bromide  1 puff Inhalation Daily   insulin aspart  0-15 Units Subcutaneous TID WC   insulin aspart  0-5 Units Subcutaneous QHS   insulin glargine-yfgn  30 Units Subcutaneous Daily   lacosamide  150 mg Oral BID   levETIRAcetam  500 mg Oral BID   metolazone  2.5 mg Oral Q Tue   metoprolol succinate  50 mg Oral Daily   mirtazapine  15 mg Oral QHS   pantoprazole  40 mg Oral BID   prazosin  2 mg Oral QHS   pregabalin  150 mg Oral TID   sodium chloride flush  3 mL Intravenous Q12H   sodium chloride flush  3 mL Intravenous Q12H   spironolactone  12.5 mg Oral Daily   traZODone  200 mg Oral QHS   venlafaxine XR  225 mg Oral Q breakfast  Continuous Infusions:     Diet Order             Diet heart healthy/carb modified Room service appropriate? Yes; Fluid consistency: Thin  Diet effective now                  Intake/Output Summary (Last 24 hours) at 01/27/2023 0951 Last data filed at 01/26/2023 2301 Gross per 24 hour  Intake --  Output 1100 ml  Net -1100 ml    Net IO Since Admission: -10,620 mL [01/27/23 0951]  Wt Readings from Last 3 Encounters:  01/26/23 (!) 164.2 kg  01/19/23 (!) 163.3 kg  01/12/23 (!) 162.8 kg      Data Reviewed: I have personally reviewed following labs and reports of imaging studies  CBC: Recent Labs  Lab 01/22/23 2054 01/23/23 0538 01/24/23 0220 01/26/23 1146 01/26/23 1156 01/26/23 1159 01/27/23 0238  WBC 13.0* 11.8* 9.6  --   --   --  10.3  NEUTROABS  --  8.5*  --   --   --   --   --   HGB 14.1 13.5 13.4 14.6 14.6 15.3 14.2  HCT 44.2 41.9 41.6 43.0 43.0 45.0 44.9  MCV 92.1 91.3 91.0  --   --   --  91.4  PLT 269 260 242  --   --   --  268    Basic Metabolic Panel: Recent Labs  Lab 01/23/23 0538 01/24/23 0220  01/25/23 0023 01/26/23 0204 01/26/23 1146 01/26/23 1156 01/26/23 1159 01/27/23 0238  NA 139 139 139 137 139 141 141 137  K 3.3* 3.1* 3.7 3.1* 3.7 3.6 3.7 4.0  CL 99 97* 97* 97*  --   --   --  99  CO2 30 28 32 30  --   --   --  30  GLUCOSE 92 154* 125* 149*  --   --   --  112*  BUN 28* 17 19 22   --   --   --  16  CREATININE 0.90 0.77 0.85 0.93  --   --   --  0.84  CALCIUM 8.4* 8.9 9.0 8.8*  --   --   --  8.9  MG 2.0  --  2.1  --   --   --   --   --   PHOS 3.3  --   --   --   --   --   --   --     GFR: Estimated Creatinine Clearance: 142.6 mL/min (by C-G formula based on SCr of 0.84 mg/dL).  Liver Function Tests: Recent Labs  Lab 01/23/23 0538  AST 14*  ALT 19  ALKPHOS 72  BILITOT 0.5  PROT 6.5  ALBUMIN 3.2*    Recent Labs  Lab 01/25/23 2156 01/26/23 0745 01/26/23 1712 01/26/23 2158 01/27/23 0756  GLUCAP 209* 121* 134* 114* 165*    Recent Results (from the past 240 hour(s))  Respiratory (~20 pathogens) panel by PCR     Status: None   Collection Time: 01/23/23  8:35 AM   Specimen: Nasopharyngeal Swab; Respiratory  Result Value Ref Range Status   Adenovirus NOT DETECTED NOT DETECTED Final   Coronavirus 229E NOT DETECTED NOT DETECTED Final    Comment: (NOTE) The Coronavirus on the Respiratory Panel, DOES NOT test for the novel  Coronavirus (2019 nCoV)    Coronavirus HKU1 NOT DETECTED NOT DETECTED Final   Coronavirus NL63 NOT  DETECTED NOT DETECTED Final   Coronavirus OC43 NOT DETECTED NOT DETECTED Final   Metapneumovirus NOT DETECTED NOT DETECTED Final   Rhinovirus / Enterovirus NOT DETECTED NOT DETECTED Final   Influenza A NOT DETECTED NOT DETECTED Final   Influenza B NOT DETECTED NOT DETECTED Final   Parainfluenza Virus 1 NOT DETECTED NOT DETECTED Final   Parainfluenza Virus 2 NOT DETECTED NOT DETECTED Final   Parainfluenza Virus 3 NOT DETECTED NOT DETECTED Final   Parainfluenza Virus 4 NOT DETECTED NOT DETECTED Final   Respiratory Syncytial Virus NOT DETECTED NOT DETECTED Final   Bordetella pertussis NOT DETECTED NOT DETECTED Final   Bordetella Parapertussis NOT DETECTED NOT DETECTED Final   Chlamydophila pneumoniae NOT DETECTED NOT DETECTED Final   Mycoplasma pneumoniae NOT DETECTED NOT DETECTED Final    Comment: Performed at [facility], [address], [place], [place] [address]  SARS Coronavirus 2 by RT PCR (hospital order, performed in [facility] hospital lab) *cepheid single result test* Anterior Nasal Swab     Status: None   Collection Time: 01/23/23  8:35 AM   Specimen: Anterior Nasal Swab  Result Value Ref Range Status   SARS Coronavirus 2 by RT PCR NEGATIVE NEGATIVE Final    Comment: Performed at [facility], [address] [place], [place] [address]    Antimicrobials: Anti-infectives (From admission, onward)    Start     Dose/Rate Route Frequency Ordered Stop   01/22/23 0000  cephALEXin (KEFLEX) 500 MG capsule  500 mg Oral 4 times daily 01/22/23 2347        Culture/Microbiology    Component Value Date/Time   SDES URINE, CLEAN CATCH 08/07/2021 2100   SPECREQUEST NONE 08/07/2021 2100   CULT (A) 08/07/2021 2100    <10,000 COLONIES/mL INSIGNIFICANT GROWTH Performed at [facility], [address] [place], [place] [address]    REPTSTATUS 08/09/2021 FINAL 08/07/2021 2100   Radiology Studies: CARDIAC CATHETERIZATION  Result Date: 01/26/2023   Prox LAD to  Mid LAD lesion is 40% stenosed.   Prox Cx to Mid Cx lesion is 20% stenosed.   Ost 2nd Mrg lesion is 20% stenosed.   Mid RCA lesion is 40% stenosed.   Prox Cx lesion is 20% stenosed. 1.  Mild to moderate diffuse obstructive coronary artery disease without high-grade obstructions. 2.  Fick cardiac output of 9.7 L/min and Fick cardiac index of 3.5 L/min/m with the following hemodynamics:  Right atrial pressure mean of 9 mmHg  Right ventricular pressure 36/4 with an end-diastolic pressure of 16 mmHg  Mean wedge pressure of 14 mmHg  Pulmonary artery pressure of 34/15 with a mean of 24 mmHg Recommendation: Continue evaluation for aortic valve intervention.     LOS: 4 days   Osvaldo Shipper, MD [facility]  01/27/2023, 9:51 AM

## 2023-01-28 DIAGNOSIS — I480 Paroxysmal atrial fibrillation: Secondary | ICD-10-CM | POA: Diagnosis not present

## 2023-01-28 DIAGNOSIS — I10 Essential (primary) hypertension: Secondary | ICD-10-CM | POA: Diagnosis not present

## 2023-01-28 DIAGNOSIS — J9601 Acute respiratory failure with hypoxia: Secondary | ICD-10-CM | POA: Diagnosis not present

## 2023-01-28 DIAGNOSIS — I5032 Chronic diastolic (congestive) heart failure: Secondary | ICD-10-CM | POA: Diagnosis not present

## 2023-01-28 LAB — GLUCOSE, CAPILLARY
Glucose-Capillary: 152 mg/dL — ABNORMAL HIGH (ref 70–99)
Glucose-Capillary: 123 mg/dL — ABNORMAL HIGH (ref 70–99)
Glucose-Capillary: 131 mg/dL — ABNORMAL HIGH (ref 70–99)
Glucose-Capillary: 129 mg/dL — ABNORMAL HIGH (ref 70–99)

## 2023-01-28 LAB — BASIC METABOLIC PANEL
Anion gap: 9 (ref 5–15)
BUN: 17 mg/dL (ref 8–23)
CO2: 27 mmol/L (ref 22–32)
Calcium: 8.8 mg/dL — ABNORMAL LOW (ref 8.9–10.3)
Chloride: 98 mmol/L (ref 98–111)
Creatinine, Ser: 0.89 mg/dL (ref 0.61–1.24)
GFR, Estimated: >60 mL/min (ref >60)
Glucose, Bld: 112 mg/dL — ABNORMAL HIGH (ref 70–99)
Potassium: 3.9 mmol/L (ref 3.5–5.1)
Sodium: 134 mmol/L — ABNORMAL LOW (ref 135–145)

## 2023-01-28 LAB — CBC
HCT: 42.5 % (ref 39.0–52.0)
Hemoglobin: 13.7 g/dL (ref 13.0–17.0)
MCH: 28.7 pg (ref 26.0–34.0)
MCHC: 32.2 g/dL (ref 30.0–36.0)
MCV: 88.9 fL (ref 80.0–100.0)
Platelets: 265 10*3/uL (ref 150–400)
RBC: 4.78 MIL/uL (ref 4.22–5.81)
RDW: 15.3 % (ref 11.5–15.5)
WBC: 10 10*3/uL (ref 4.0–10.5)
nRBC: 0 % (ref 0.0–0.2)

## 2023-01-28 MED ORDER — APIXABAN 5 MG PO TABS
5.0000 mg | ORAL_TABLET | Freq: Two times a day (BID) | ORAL | Status: DC
  Administered 2023-01-28 – 2023-02-01 (×9): 5 mg via ORAL
  Filled 2023-01-28 (×9): qty 1

## 2023-01-28 MED ORDER — TORSEMIDE 20 MG PO TABS
20.0000 mg | ORAL_TABLET | Freq: Every day | ORAL | Status: DC
  Administered 2023-01-28 – 2023-02-05 (×8): 20 mg via ORAL
  Filled 2023-01-28 (×8): qty 1

## 2023-01-28 NOTE — Progress Notes (Signed)
Rounding Note    Patient Name: Lawrence [name] Date of Encounter: 01/28/2023  [facility] Cardiologist: Armanda Magic, MD   Subjective   No acute events overnight. Remains on O2 by nasal cannula, sitting in chair. Breathing stable. Wife present via speakerphone.  Inpatient Medications    Scheduled Meds:  allopurinol  200 mg Oral BID   apixaban  5 mg Oral BID   ARIPiprazole  5 mg Oral QHS   atorvastatin  40 mg Oral Daily   busPIRone  10 mg Oral TID   dapagliflozin propanediol  10 mg Oral Q breakfast   diltiazem  120 mg Oral Daily   fenofibrate  160 mg Oral Daily   fluticasone furoate-vilanterol  1 puff Inhalation Daily   And   umeclidinium bromide  1 puff Inhalation Daily   insulin aspart  0-15 Units Subcutaneous TID WC   insulin aspart  0-5 Units Subcutaneous QHS   insulin glargine-yfgn  30 Units Subcutaneous Daily   lacosamide  150 mg Oral BID   levETIRAcetam  500 mg Oral BID   metolazone  2.5 mg Oral Q Tue   metoprolol succinate  50 mg Oral Daily   mirtazapine  15 mg Oral QHS   pantoprazole  40 mg Oral BID   prazosin  2 mg Oral QHS   pregabalin  150 mg Oral TID   sodium chloride flush  3 mL Intravenous Q12H   sodium chloride flush  3 mL Intravenous Q12H   spironolactone  12.5 mg Oral Daily   torsemide  20 mg Oral Daily   traZODone  200 mg Oral QHS   venlafaxine XR  225 mg Oral Q breakfast   Continuous Infusions:  PRN Meds: acetaminophen **OR** acetaminophen, albuterol, melatonin, morphine injection, ondansetron (ZOFRAN) IV, oxyCODONE   Vital Signs    Vitals:   01/27/23 2022 01/28/23 0000 01/28/23 0457 01/28/23 0745  BP: 116/82 120/70 119/77   Pulse: 73 70 73   Resp: 17 16 17    Temp: 97.8 F (36.6 C) 97.8 F (36.6 C) 97.7 F (36.5 C)   TempSrc: Oral Oral Oral   SpO2: 91% 94%  94%  Weight:   (!) 166.2 kg   Height:        Intake/Output Summary (Last 24 hours) at 01/28/2023 1028 Last data filed at 01/28/2023 0900 Gross per 24 hour  Intake  180 ml  Output 300 ml  Net -120 ml      01/28/2023    4:57 AM 01/26/2023    5:00 AM 01/25/2023    3:00 AM  Last 3 Weights  Weight (lbs) 366 lb 6.5 oz 362 lb 361 lb 14.4 oz  Weight (kg) 166.2 kg 164.202 kg 164.157 kg      Telemetry    SR with PVCs - Personally Reviewed  ECG    Sinus tach with 2 PVCs at 102 bpm - Personally Reviewed  Physical Exam   GEN: No acute distress.  O2 by [place] in place.  Neck: JVD not appreciated sitting upright Cardiac: RRR, no  rubs, or gallops. 2/6 SEM Respiratory: Clear to auscultation bilaterally. GI: Soft, nontender, non-distended  MS: Brawny edema bilaterally with chronic venous stasis changes Neuro:  Nonfocal  Psych: Normal affect   Labs    High Sensitivity Troponin:   Recent Labs  Lab 01/22/23 2054 01/22/23 2348  TROPONINIHS 8 8     Chemistry Recent Labs  Lab 01/23/23 0538 01/24/23 0220 01/25/23 0023 01/26/23 0204 01/26/23 1146 01/26/23 1159 01/27/23  1610 01/28/23 0224  NA 139   < > 139 137   < > 141 137 134*  K 3.3*   < > 3.7 3.1*   < > 3.7 4.0 3.9  CL 99   < > 97* 97*  --   --  99 98  CO2 30   < > 32 30  --   --  30 27  GLUCOSE 92   < > 125* 149*  --   --  112* 112*  BUN 28*   < > 19 22  --   --  16 17  CREATININE 0.90   < > 0.85 0.93  --   --  0.84 0.89  CALCIUM 8.4*   < > 9.0 8.8*  --   --  8.9 8.8*  MG 2.0  --  2.1  --   --   --   --   --   PROT 6.5  --   --   --   --   --   --   --   ALBUMIN 3.2*  --   --   --   --   --   --   --   AST 14*  --   --   --   --   --   --   --   ALT 19  --   --   --   --   --   --   --   ALKPHOS 72  --   --   --   --   --   --   --   BILITOT 0.5  --   --   --   --   --   --   --   GFRNONAA >60   < > >60 >60  --   --  >60 >60  ANIONGAP 10   < > 10 10  --   --  8 9   < > = values in this interval not displayed.    Lipids No results for input(s): "CHOL", "TRIG", "HDL", "LABVLDL", "LDLCALC", "CHOLHDL" in the last 168 hours.  Hematology Recent Labs  Lab 01/24/23 0220 01/26/23 1146  01/26/23 1159 01/27/23 0238 01/28/23 0224  WBC 9.6  --   --  10.3 10.0  RBC 4.57  --   --  4.91 4.78  HGB 13.4   < > 15.3 14.2 13.7  HCT 41.6   < > 45.0 44.9 42.5  MCV 91.0  --   --  91.4 88.9  MCH 29.3  --   --  28.9 28.7  MCHC 32.2  --   --  31.6 32.2  RDW 15.4  --   --  15.5 15.3  PLT 242  --   --  268 265   < > = values in this interval not displayed.   Thyroid No results for input(s): "TSH", "FREET4" in the last 168 hours.  BNP Recent Labs  Lab 01/22/23 2054  BNP 25.3    DDimer No results for input(s): "DDIMER" in the last 168 hours.   Radiology    CT CORONARY MORPH W/CTA COR W/SCORE W/CA W/CM &/OR WO/CM  Result Date: 01/27/2023 CLINICAL DATA:  29M with severe aortic stenosis being evaluated for a TAVR procedure. EXAM: Cardiac TAVR CT TECHNIQUE: The patient was scanned on a [employer]. A 120 kV retrospective scan was triggered in the descending thoracic aorta at 111 HU's. Gantry rotation speed was 250 msecs  and collimation was .6 mm. No beta blockade or nitro were given. The 3D data set was reconstructed in 5% intervals of the R-R cycle. Systolic and diastolic phases were analyzed on a dedicated work station using MPR, MIP and VRT modes. The patient received 100 cc of contrast. FINDINGS: Aortic valve: Trileaflet. Poor contrast opacification, unable to make TAVR measurements Aortic valve calcium score: 3685 Cardiac: Right atrium: Mild enlargement. Lipomatous hypertrophy of interatrial septum Right ventricle: Mild enlargement Pulmonary arteries: Normal size Pulmonary veins: Normal configuration Left atrium: Mild enlargement Left ventricle: Normal size Pericardium: Normal thickness Coronary arteries: Calcium score 4290 (99th percentile) IMPRESSION: 1. During contrast injection, IV pressure limit was reached, resulting in low contrast injection rate and very poor contrast opacification. Contrast is not sufficient to make TAVR measurements. Recommend repeating study. 2.  Tricuspid aortic valve with severe calcifications (AV calcium score 3685) 3.  Coronary calcium score 4290 (99th percentile) Electronically Signed   By: Epifanio Lesches M.D.   On: 01/27/2023 17:48   CT MAXILLOFACIAL WO CONTRAST  Result Date: 01/27/2023 CLINICAL DATA:  TMJ pain or limited movement.  Poor dentition. EXAM: CT MAXILLOFACIAL WITHOUT CONTRAST TECHNIQUE: Multidetector CT imaging of the maxillofacial structures was performed. Multiplanar CT image reconstructions were also generated. RADIATION DOSE REDUCTION: This exam was performed according to the departmental dose-optimization program which includes automated exposure control, adjustment of the mA and/or kV according to patient size and/or use of iterative reconstruction technique. COMPARISON:  Head and neck CTA 05/25/2022 FINDINGS: Osseous: Poor dentition with numerous dental caries including prominent involvement of mandibular and maxillary molars bilaterally where there are associated periapical erosions. No acute inflammatory changes are identified in the overlying soft tissues. No acute fracture. Orbits: Unremarkable. Sinuses: Mild-to-moderate mucosal thickening in the paranasal sinuses, greatest in the ethmoid air cells bilaterally. Minimal fluid in the sphenoid sinuses. Clear mastoid air cells. Soft tissues: Unremarkable. Limited intracranial: Unremarkable. IMPRESSION: Poor dentition with numerous dental caries and periapical erosions. No evidence of overlying acute soft tissue inflammation/infection. Electronically Signed   By: Sebastian Ache M.D.   On: 01/27/2023 16:50   CARDIAC CATHETERIZATION  Result Date: 01/26/2023   Prox LAD to Mid LAD lesion is 40% stenosed.   Prox Cx to Mid Cx lesion is 20% stenosed.   Ost 2nd Mrg lesion is 20% stenosed.   Mid RCA lesion is 40% stenosed.   Prox Cx lesion is 20% stenosed. 1.  Mild to moderate diffuse obstructive coronary artery disease without high-grade obstructions. 2.  Fick cardiac output of  9.7 L/min and Fick cardiac index of 3.5 L/min/m with the following hemodynamics:  Right atrial pressure mean of 9 mmHg  Right ventricular pressure 36/4 with an end-diastolic pressure of 16 mmHg  Mean wedge pressure of 14 mmHg  Pulmonary artery pressure of 34/15 with a mean of 24 mmHg Recommendation: Continue evaluation for aortic valve intervention.    Cardiac Studies   Echo, cath reviewed  Patient Profile     67 y.o. male with a history of non-obstructive CAD noted on cardiac catheterization in 2019, chronic diastolic CHF, paroxysmal atrial fibrillation on Eliquis, severe aortic stenosis noted on recent Echo in 12/2022, hypertension, hyperlipidemia, type 2 diabetes mellitus with peripheral neuropathy, COPD, obstructive sleep apnea on CPAP, seizure disorder, and prior tobacco use (quit about 6 years ago) who is being seen for the evaluation of CHF at the request of Dr. Madelyn Flavors.   Assessment & Plan    Acute hypoxic respiratory failure acute on chronic diastolic CHF  severe aortic stenosis - Echo 12/22/2022: EF 60-65%, severe AS  - diuresed well with IV Lasix (also takes metolazone once a week on Tuesdays). Restart oral torsemide today -charted net negative 11 L, but intake not well documented. Weight up slightly from admission. Wedge 14 on cath. -CT TAVR attempted 6/21, unable to get good opacification, plan to repeat on 6/24 -TAVR team following, planned to complete evaluation as an outpatient   Chest Pain, atypical non-obstructive CAD, HLD - chronic intermittent atypical CP for 6-7 months  - negative troponins for similar discomfort on admission, cath with nonobstructive disease - analgesia with Tylenol, oxycodone - continue PPI - continue statin   H/o PAF (on OAC PTA) with PVCs on telemetry - restart apixaban today - occasional PVCs on telemetry - continue BB as tolerated   Otherwise, per primary team: - COPD - Type 2 diabetes mellitus  - Seizure disorder - Anxiety and  depression - OSA on CPAP  For questions or updates, please contact [facility] Please consult [email or web address] for contact info under        Signed, Jodelle Red, MD  01/28/2023, 10:28 AM

## 2023-01-28 NOTE — Progress Notes (Signed)
   01/28/23 2100  BiPAP/CPAP/SIPAP  BiPAP/CPAP/SIPAP Pt Type Adult  BiPAP/CPAP/SIPAP Resmed  Reason BIPAP/CPAP not in use Non-compliant

## 2023-01-28 NOTE — Progress Notes (Signed)
Occupational Therapy Treatment Patient Details Name: Lawrence [name] MRN: [id number] DOB: 06-03-1956 Today's Date: 01/28/2023   History of present illness Pt is a 67 y/o male who presents 01/22/23 with SOB and intermittent L sided CP for ~2 weeks PTA. R/L heart cath scheduled for 6/20 while inpatient. S/P heart cath 6/20. PMH significant for HTN, CHF, PAF on Eliquis, aortic stenosis, CAD, CVA, DM II, COPD, AAA.   OT comments  Pt continuing to progress on pt focused goals, Pt needing Mod A for STS but ambulating with min guard needing 2 rest breaks to ambulate 23ft. Pt continues to have decreased activity tolerance but is determined to challenge self, RPE of 18/20 by end of session but unable to obtain accurate HR reading, it read 85 bpm with ambulation and returned to 74 bpm at rest in chair. OT to continue to progress pt as able. DC plans remain appropriate for [facility] as long as pt continues to progress with mobility.    Recommendations for follow up therapy are one component of a multi-disciplinary discharge planning process, led by the attending physician.  Recommendations may be updated based on patient status, additional functional criteria and insurance authorization.    Assistance Recommended at Discharge Intermittent Supervision/Assistance  Patient can return home with the following  A lot of help with bathing/dressing/bathroom;Assistance with cooking/housework;Direct supervision/assist for medications management;Direct supervision/assist for financial management;Assist for transportation;Help with stairs or ramp for entrance;A lot of help with walking and/or transfers   Equipment Recommendations  None recommended by OT    Recommendations for Other Services      Precautions / Restrictions Precautions Precautions: Fall Precaution Comments: monitor HR and sats Restrictions Weight Bearing Restrictions: No       Mobility Bed Mobility               General bed mobility comments:  Pt rec'd and left sitting in recliner    Transfers Overall transfer level: Needs assistance Equipment used: Rolling walker (2 wheels) Transfers: Sit to/from Stand Sit to Stand: Mod assist, +2 physical assistance, +2 safety/equipment           General transfer comment: Pt initially mod A +2 for first STS, following use of momentum pt was Mod A +1     Balance Overall balance assessment: Needs assistance Sitting-balance support: No upper extremity supported, Feet supported Sitting balance-Leahy Scale: Fair Sitting balance - Comments: in recliner   Standing balance support: Bilateral upper extremity supported, During functional activity Standing balance-Leahy Scale: Poor Standing balance comment: reliant on RW support                           ADL either performed or assessed with clinical judgement   ADL                                       Functional mobility during ADLs: Min guard;Rolling walker (2 wheels) General ADL Comments: Pt ambulated 8ft with 2 seated rest breaks, declined need to complete bADLs as he did them earlier    [facility] Assessment              Vision       Perception     Praxis      Cognition Arousal/Alertness: Awake/alert Behavior During Therapy: WFL for tasks assessed/performed Overall Cognitive Status: Within Functional Limits for tasks assessed  Exercises      Shoulder Instructions       General Comments Pt reprorts and RPE of 16/20 following ambulation to the sink and 18/20 following hall ambulation. Sp02 94% on 2L and bp 107/65 at start of session.    Pertinent Vitals/ Pain       Pain Assessment Pain Assessment: 0-10 Pain Score: 3  Pain Location: BLEs Pain Descriptors / Indicators: Sore Pain Intervention(s): Monitored during session, Repositioned  Home Living                                          Prior  Functioning/Environment              Frequency  Min 2X/week        Progress Toward Goals  OT Goals(current goals can now be found in the care plan section)  Progress towards OT goals: Progressing toward goals  Acute Rehab OT Goals Patient Stated Goal: to get better OT Goal Formulation: With patient Time For Goal Achievement: 02/08/23 Potential to Achieve Goals: Fair  Plan Discharge plan remains appropriate;Frequency remains appropriate    Co-evaluation                 AM-PAC OT "6 Clicks" Daily Activity     Outcome Measure   Help from another person eating meals?: None Help from another person taking care of personal grooming?: A Little Help from another person toileting, which includes using toliet, bedpan, or urinal?: A Lot Help from another person bathing (including washing, rinsing, drying)?: A Lot Help from another person to put on and taking off regular upper body clothing?: A Little Help from another person to put on and taking off regular lower body clothing?: A Lot 6 Click Score: 16    End of Session Equipment Utilized During Treatment: Gait belt;Rolling walker (2 wheels);Oxygen  OT Visit Diagnosis: Other abnormalities of gait and mobility (R26.89);Unsteadiness on feet (R26.81);Muscle weakness (generalized) (M62.81)   Activity Tolerance Patient tolerated treatment well   Patient Left in chair;with call bell/phone within reach   Nurse Communication Mobility status        Time: 4742-5956 OT Time Calculation (min): 23 min  Charges: OT Treatments $Therapeutic Activity: 23-37 mins  01/28/2023  AB, OTR/L  Acute Rehabilitation Services  Office: [phone]    Tristan Schroeder 01/28/2023, 11:37 AM

## 2023-01-28 NOTE — Progress Notes (Signed)
Mobility Specialist Progress Note    01/28/23 1052  Mobility  Activity Ambulated with assistance in hallway  Level of Assistance +2 (takes two people) (chair follow)  Assistive Device Front wheel walker  Distance Ambulated (ft) 40 ft (15+25)  Activity Response Tolerated well  Mobility Referral Yes  $Mobility charge 1 Mobility  Mobility Specialist Start Time (ACUTE ONLY) 1020  Mobility Specialist Stop Time (ACUTE ONLY) 1047  Mobility Specialist Time Calculation (min) (ACUTE ONLY) 27 min   Pre-Mobility: 73 HR, 107/65 (79) BP, 93% SpO2 During Mobility: 90-94% SpO2 Post-Mobility: 71 HR, 95% SpO2  Pt received in chair and agreeable. C/o some leg pain from swelling. Pt modA+1 to stand. Encouraged pursed lip breathing. On 4LO2. Took x1 seated rest break at sink to wipe off. Pt c/o a little lightheadedness. Able to progress to minA+1 for second stand. Rolled back to room and left in chair with call bell in reach. RN aware.    [place] Mobility Specialist  Please Neurosurgeon or Rehab Office at [phone]

## 2023-01-28 NOTE — Progress Notes (Signed)
PROGRESS NOTE TAYRON HUNNELL  ZSW:[id number] DOB: 11/16/1955 DOA: 01/22/2023 PCP: [name], PA-C   Brief Narrative/Hospital Course: 67 y.o. male with history of hypertension,CHF, PAF on Eliquis, aortic stenosis, CAD, CVA, diabetes mellitus type 2, COPD, AAA, prior history of tobacco abuse, morbid obesity, and OSA on CPAP presented with complaints of shortness of breath, intermittent left sided chest pain over the past 2 weeks. Patient being evaluated in the outpatient setting with tentatively planned right/left heart cath June 25. Hospitalist called for admission - cardiology called in consult.  Subjective: Patient mentioned that he was feeling much better.  Denies any chest pain shortness of breath.  No nausea or vomiting.  Assessment and Plan:   Acute respiratory failure with hypoxia and hypercapnia/dyspnea on exertion Exertional chest pain, ACS ruled out. -At baseline on room air, continue to wean oxygen down to 2 L today -Symptoms secondary to acute diastolic CHF as well as severe aortic stenosis. -CT angiogram negative for PE, respiratory viral panel negative -Continue home inhalers -Cardiac cath negative for obstructive disease, chest pain thought to be noncardiac in nature. -Continue PPI  Acute on chronic Diastolic congestive heart failure Echocardiogram from May showed EF to be 60 to 65%.  Likely has diastolic dysfunction. -Volume overloaded on admission, diuresing well, cardiology transition to p.o. torsemide, spironolactone and metolazone. Strict I's and O's negative 12L, daily weights increasing - unclear accuracy   Hypokalemia: Replete when appropriate, currently within normal limits.   COPD Continue home medications  Type 2 diabetes mellitus well-controlled with long-term use of insulin: HbA1c 6.4.  Continue SSI, Farxiga, glargine.  Monitor CBGs.   Paroxysmal atrial fibrillation  Rate controlled on Cardizem. Eliquis resumed per discussion with cardiology    Essential hypertension: Soft blood pressures noted while on multiple antihypertensives including diltiazem, metoprolol, Minipress.  Also on diuretics including furosemide and spironolactone. Defer to cardiology.   Anxiety and depression Continue as BuSpar, Abilify, venlafaxine   Seizure disorder Continue home Keppra and lacosamide  Obstructive sleep apnea Continue CPAP  Morbid obesity Estimated body mass index is 47.04 kg/m as calculated from the following:   Height as of this encounter: 6\' 2"  (1.88 m).   Weight as of this encounter: 166.2 kg.  DVT prophylaxis SCDs Start: 01/23/23 0527 Code Status:   Code Status: DNR Family Communication: Discussed with patient.   Dispo: Home health recommended by physical therapy.  Objective:  Vitals:   01/27/23 1122 01/27/23 2022 01/28/23 0000 01/28/23 0457  BP: 116/70 116/82 120/70 119/77  Pulse:  73 70 73  Resp: 16 17 16 17   Temp:  97.8 F (36.6 C) 97.8 F (36.6 C) 97.7 F (36.5 C)  TempSrc:  Oral Oral Oral  SpO2:  91% 94%   Weight:    (!) 166.2 kg  Height:       Weight change:   Physical Examination:  General appearance: Awake alert.  In no distress Resp: Clear to auscultation bilaterally.  Normal effort Cardio: S1-S2 is normal regular.  No S3-S4.  No rubs murmurs or bruit GI: Abdomen is soft.  Nontender nondistended.  Bowel sounds are present normal.  No masses organomegaly Extremities: Improvement in pedal edema noted. Neurologic: Alert and oriented x3.  No focal neurological deficits.   Medications:  Scheduled Meds:  allopurinol  200 mg Oral BID   ARIPiprazole  5 mg Oral QHS   atorvastatin  40 mg Oral Daily   busPIRone  10 mg Oral TID   dapagliflozin propanediol  10 mg Oral Q breakfast  diltiazem  120 mg Oral Daily   fenofibrate  160 mg Oral Daily   fluticasone furoate-vilanterol  1 puff Inhalation Daily   And   umeclidinium bromide  1 puff Inhalation Daily   insulin aspart  0-15 Units Subcutaneous TID WC    insulin aspart  0-5 Units Subcutaneous QHS   insulin glargine-yfgn  30 Units Subcutaneous Daily   lacosamide  150 mg Oral BID   levETIRAcetam  500 mg Oral BID   metolazone  2.5 mg Oral Q Tue   metoprolol succinate  50 mg Oral Daily   mirtazapine  15 mg Oral QHS   pantoprazole  40 mg Oral BID   prazosin  2 mg Oral QHS   pregabalin  150 mg Oral TID   sodium chloride flush  3 mL Intravenous Q12H   sodium chloride flush  3 mL Intravenous Q12H   spironolactone  12.5 mg Oral Daily   traZODone  200 mg Oral QHS   venlafaxine XR  225 mg Oral Q breakfast  Continuous Infusions:     Diet Order             Diet heart healthy/carb modified Room service appropriate? Yes; Fluid consistency: Thin  Diet effective now                  Intake/Output Summary (Last 24 hours) at 01/28/2023 0705 Last data filed at 01/27/2023 1220 Gross per 24 hour  Intake --  Output 600 ml  Net -600 ml    Net IO Since Admission: -11,220 mL [01/28/23 0705]  Wt Readings from Last 3 Encounters:  01/28/23 (!) 166.2 kg  01/19/23 (!) 163.3 kg  01/12/23 (!) 162.8 kg     Data Reviewed: I have personally reviewed following labs and reports of imaging studies  CBC: Recent Labs  Lab 01/22/23 2054 01/23/23 0538 01/24/23 0220 01/26/23 1146 01/26/23 1156 01/26/23 1159 01/27/23 0238 01/28/23 0224  WBC 13.0* 11.8* 9.6  --   --   --  10.3 10.0  NEUTROABS  --  8.5*  --   --   --   --   --   --   HGB 14.1 13.5 13.4 14.6 14.6 15.3 14.2 13.7  HCT 44.2 41.9 41.6 43.0 43.0 45.0 44.9 42.5  MCV 92.1 91.3 91.0  --   --   --  91.4 88.9  PLT 269 260 242  --   --   --  268 265    Basic Metabolic Panel: Recent Labs  Lab 01/23/23 0538 01/24/23 0220 01/25/23 0023 01/26/23 0204 01/26/23 1146 01/26/23 1156 01/26/23 1159 01/27/23 0238 01/28/23 0224  NA 139 139 139 137 139 141 141 137 134*  K 3.3* 3.1* 3.7 3.1* 3.7 3.6 3.7 4.0 3.9  CL 99 97* 97* 97*  --   --   --  99 98  CO2 30 28 32 30  --   --   --  30 27   GLUCOSE 92 154* 125* 149*  --   --   --  112* 112*  BUN 28* 17 19 22   --   --   --  16 17  CREATININE 0.90 0.77 0.85 0.93  --   --   --  0.84 0.89  CALCIUM 8.4* 8.9 9.0 8.8*  --   --   --  8.9 8.8*  MG 2.0  --  2.1  --   --   --   --   --   --  PHOS 3.3  --   --   --   --   --   --   --   --     GFR: Estimated Creatinine Clearance: 135.5 mL/min (by C-G formula based on SCr of 0.89 mg/dL).  Liver Function Tests: Recent Labs  Lab 01/23/23 0538  AST 14*  ALT 19  ALKPHOS 72  BILITOT 0.5  PROT 6.5  ALBUMIN 3.2*    Recent Labs  Lab 01/26/23 2158 01/27/23 0756 01/27/23 1219 01/27/23 1622 01/27/23 2108  GLUCAP 114* 165* 125* 138* 170*    Recent Results (from the past 240 hour(s))  Respiratory (~20 pathogens) panel by PCR     Status: None   Collection Time: 01/23/23  8:35 AM   Specimen: Nasopharyngeal Swab; Respiratory  Result Value Ref Range Status   Adenovirus NOT DETECTED NOT DETECTED Final   Coronavirus 229E NOT DETECTED NOT DETECTED Final    Comment: (NOTE) The Coronavirus on the Respiratory Panel, DOES NOT test for the novel  Coronavirus (2019 nCoV)    Coronavirus HKU1 NOT DETECTED NOT DETECTED Final   Coronavirus NL63 NOT DETECTED NOT DETECTED Final   Coronavirus OC43 NOT DETECTED NOT DETECTED Final   Metapneumovirus NOT DETECTED NOT DETECTED Final   Rhinovirus / Enterovirus NOT DETECTED NOT DETECTED Final   Influenza A NOT DETECTED NOT DETECTED Final   Influenza B NOT DETECTED NOT DETECTED Final   Parainfluenza Virus 1 NOT DETECTED NOT DETECTED Final   Parainfluenza Virus 2 NOT DETECTED NOT DETECTED Final   Parainfluenza Virus 3 NOT DETECTED NOT DETECTED Final   Parainfluenza Virus 4 NOT DETECTED NOT DETECTED Final   Respiratory Syncytial Virus NOT DETECTED NOT DETECTED Final   Bordetella pertussis NOT DETECTED NOT DETECTED Final   Bordetella Parapertussis NOT DETECTED NOT DETECTED Final   Chlamydophila pneumoniae NOT DETECTED NOT DETECTED Final    Mycoplasma pneumoniae NOT DETECTED NOT DETECTED Final    Comment: Performed at [facility], [address] [place], [place] [address]  SARS Coronavirus 2 by RT PCR (hospital order, performed in [facility] hospital lab) *cepheid single result test* Anterior Nasal Swab     Status: None   Collection Time: 01/23/23  8:35 AM   Specimen: Anterior Nasal Swab  Result Value Ref Range Status   SARS Coronavirus 2 by RT PCR NEGATIVE NEGATIVE Final    Comment: Performed at [facility], [address], [place], [place] [address]    Antimicrobials: Anti-infectives (From admission, onward)    Start     Dose/Rate Route Frequency Ordered Stop   01/22/23 0000  cephALEXin (KEFLEX) 500 MG capsule        500 mg Oral 4 times daily 01/22/23 2347        Culture/Microbiology    Component Value Date/Time   SDES URINE, CLEAN CATCH 08/07/2021 2100   SPECREQUEST NONE 08/07/2021 2100   CULT (A) 08/07/2021 2100    <10,000 COLONIES/mL INSIGNIFICANT GROWTH Performed at [facility], [address] [place], [place] [address]    REPTSTATUS 08/09/2021 FINAL 08/07/2021 2100   Radiology Studies: CT CORONARY MORPH W/CTA COR W/SCORE W/CA W/CM &/OR WO/CM  Result Date: 01/27/2023 CLINICAL DATA:  66M with severe aortic stenosis being evaluated for a TAVR procedure. EXAM: Cardiac TAVR CT TECHNIQUE: The patient was scanned on a [employer]. A 120 kV retrospective scan was triggered in the descending thoracic aorta at 111 HU's. Gantry rotation speed was 250 msecs and collimation was .6  mm. No beta blockade or nitro were given. The 3D data set was reconstructed in 5% intervals of the R-R cycle. Systolic and diastolic phases were analyzed on a dedicated work station using MPR, MIP and VRT modes. The patient received 100 cc of contrast. FINDINGS: Aortic valve: Trileaflet. Poor contrast opacification, unable to make TAVR measurements Aortic valve calcium score: 3685 Cardiac: Right atrium: Mild  enlargement. Lipomatous hypertrophy of interatrial septum Right ventricle: Mild enlargement Pulmonary arteries: Normal size Pulmonary veins: Normal configuration Left atrium: Mild enlargement Left ventricle: Normal size Pericardium: Normal thickness Coronary arteries: Calcium score 4290 (99th percentile) IMPRESSION: 1. During contrast injection, IV pressure limit was reached, resulting in low contrast injection rate and very poor contrast opacification. Contrast is not sufficient to make TAVR measurements. Recommend repeating study. 2. Tricuspid aortic valve with severe calcifications (AV calcium score 3685) 3.  Coronary calcium score 4290 (99th percentile) Electronically Signed   By: Epifanio Lesches M.D.   On: 01/27/2023 17:48   CT MAXILLOFACIAL WO CONTRAST  Result Date: 01/27/2023 CLINICAL DATA:  TMJ pain or limited movement.  Poor dentition. EXAM: CT MAXILLOFACIAL WITHOUT CONTRAST TECHNIQUE: Multidetector CT imaging of the maxillofacial structures was performed. Multiplanar CT image reconstructions were also generated. RADIATION DOSE REDUCTION: This exam was performed according to the departmental dose-optimization program which includes automated exposure control, adjustment of the mA and/or kV according to patient size and/or use of iterative reconstruction technique. COMPARISON:  Head and neck CTA 05/25/2022 FINDINGS: Osseous: Poor dentition with numerous dental caries including prominent involvement of mandibular and maxillary molars bilaterally where there are associated periapical erosions. No acute inflammatory changes are identified in the overlying soft tissues. No acute fracture. Orbits: Unremarkable. Sinuses: Mild-to-moderate mucosal thickening in the paranasal sinuses, greatest in the ethmoid air cells bilaterally. Minimal fluid in the sphenoid sinuses. Clear mastoid air cells. Soft tissues: Unremarkable. Limited intracranial: Unremarkable. IMPRESSION: Poor dentition with numerous dental  caries and periapical erosions. No evidence of overlying acute soft tissue inflammation/infection. Electronically Signed   By: Sebastian Ache M.D.   On: 01/27/2023 16:50   CARDIAC CATHETERIZATION  Result Date: 01/26/2023   Prox LAD to Mid LAD lesion is 40% stenosed.   Prox Cx to Mid Cx lesion is 20% stenosed.   Ost 2nd Mrg lesion is 20% stenosed.   Mid RCA lesion is 40% stenosed.   Prox Cx lesion is 20% stenosed. 1.  Mild to moderate diffuse obstructive coronary artery disease without high-grade obstructions. 2.  Fick cardiac output of 9.7 L/min and Fick cardiac index of 3.5 L/min/m with the following hemodynamics:  Right atrial pressure mean of 9 mmHg  Right ventricular pressure 36/4 with an end-diastolic pressure of 16 mmHg  Mean wedge pressure of 14 mmHg  Pulmonary artery pressure of 34/15 with a mean of 24 mmHg Recommendation: Continue evaluation for aortic valve intervention.     LOS: 5 days   Azucena Fallen, DO [facility]  01/28/2023, 7:05 AM

## 2023-01-29 DIAGNOSIS — J9601 Acute respiratory failure with hypoxia: Secondary | ICD-10-CM | POA: Diagnosis not present

## 2023-01-29 DIAGNOSIS — I480 Paroxysmal atrial fibrillation: Secondary | ICD-10-CM | POA: Diagnosis not present

## 2023-01-29 DIAGNOSIS — I5033 Acute on chronic diastolic (congestive) heart failure: Secondary | ICD-10-CM | POA: Diagnosis not present

## 2023-01-29 DIAGNOSIS — I350 Nonrheumatic aortic (valve) stenosis: Secondary | ICD-10-CM | POA: Diagnosis not present

## 2023-01-29 LAB — GLUCOSE, CAPILLARY
Glucose-Capillary: 164 mg/dL — ABNORMAL HIGH (ref 70–99)
Glucose-Capillary: 113 mg/dL — ABNORMAL HIGH (ref 70–99)
Glucose-Capillary: 126 mg/dL — ABNORMAL HIGH (ref 70–99)
Glucose-Capillary: 162 mg/dL — ABNORMAL HIGH (ref 70–99)

## 2023-01-29 LAB — BASIC METABOLIC PANEL
Anion gap: 9 (ref 5–15)
BUN: 18 mg/dL (ref 8–23)
CO2: 26 mmol/L (ref 22–32)
Calcium: 8.6 mg/dL — ABNORMAL LOW (ref 8.9–10.3)
Chloride: 100 mmol/L (ref 98–111)
Creatinine, Ser: 0.82 mg/dL (ref 0.61–1.24)
GFR, Estimated: >60 mL/min (ref >60)
Glucose, Bld: 102 mg/dL — ABNORMAL HIGH (ref 70–99)
Potassium: 3.7 mmol/L (ref 3.5–5.1)
Sodium: 135 mmol/L (ref 135–145)

## 2023-01-29 MED ORDER — DICLOFENAC SODIUM 1 % EX GEL
4.0000 g | Freq: Four times a day (QID) | CUTANEOUS | Status: DC
  Administered 2023-01-29 – 2023-02-08 (×21): 4 g via TOPICAL
  Filled 2023-01-29 (×2): qty 100

## 2023-01-29 NOTE — Progress Notes (Signed)
PROGRESS NOTE ZAKHARI FOGEL  WUJ:[id number] DOB: [birth year] DOA: 01/22/2023 PCP: [name], PA-C   Brief Narrative/Hospital Course: 67 y.o. male with history of hypertension,CHF, PAF on Eliquis, aortic stenosis, CAD, CVA, diabetes mellitus type 2, COPD, AAA, prior history of tobacco abuse, morbid obesity, and OSA on CPAP presented with complaints of shortness of breath, intermittent left sided chest pain over the past 2 weeks. Patient being evaluated in the outpatient setting with tentatively planned right/left heart cath June 25. Hospitalist called for admission - cardiology called in consult.  Subjective: Patient mentioned that he was feeling much better.  Denies any chest pain shortness of breath.  No nausea or vomiting.  Assessment and Plan:   Acute respiratory failure with hypoxia and hypercapnia/dyspnea on exertion Exertional chest pain, ACS ruled out. -At baseline on room air, continue to wean oxygen down to 2 L today -Symptoms secondary to acute diastolic CHF as well as severe aortic stenosis. -CT angiogram negative for PE, respiratory viral panel negative -Continue home inhalers -Cardiac cath negative for obstructive disease, chest pain thought to be noncardiac in nature. -Continue PPI  Acute on chronic Diastolic congestive heart failure Severe aortic stenosis -Echocardiogram from May showed EF to be 60 to 65%.  Likely has diastolic dysfunction. -Volume overloaded on admission, diuresing well, cardiology transition to p.o. torsemide, spironolactone and metolazone. -Strict I's and O's negative 12L, daily weights increasing - unclear accuracy -TAVR attempted 6/21 - repeat attempt planned 6/24 - Patient remains markedly symptomatic, unclear goals of care if aortic stenosis is unable to be corrected. May be reasonable to discuss case with palliative care pending TAVR attempt in the am.  Hypokalemia: Replete when appropriate, currently within normal limits.    COPD Continue home medications  Type 2 diabetes mellitus well-controlled with long-term use of insulin: HbA1c 6.4.  Continue SSI, Farxiga, glargine.  Monitor CBGs.   Paroxysmal atrial fibrillation  Rate controlled on Cardizem. Eliquis resumed per discussion with cardiology   Essential hypertension: Soft blood pressures noted while on multiple antihypertensives including diltiazem, metoprolol, Minipress.  Also on diuretics including furosemide and spironolactone. Defer to cardiology.   Anxiety and depression Continue as BuSpar, Abilify, venlafaxine   Seizure disorder Continue home Keppra and lacosamide  Obstructive sleep apnea Continue CPAP  Morbid obesity Estimated body mass index is 46.73 kg/m as calculated from the following:   Height as of this encounter: 6\' 2"  (1.88 m).   Weight as of this encounter: 165.1 kg.  DVT prophylaxis SCDs Start: 01/23/23 0527 apixaban (ELIQUIS) tablet 5 mg Code Status:   Code Status: DNR Family Communication: Discussed with patient.   Dispo: Home health recommended by physical therapy.  Objective:  Vitals:   01/28/23 1439 01/28/23 1959 01/29/23 0052 01/29/23 0359  BP: 112/69 114/69 112/71 117/82  Pulse: 74 70 70 66  Resp: 18 18 18 20   Temp: 98 F (36.7 C) 97.8 F (36.6 C) 97.8 F (36.6 C) 97.6 F (36.4 C)  TempSrc: Oral Oral Oral Oral  SpO2: 92% 93% 94% 92%  Weight:    (!) 165.1 kg  Height:       Weight change: -1.091 kg  Physical Examination:  General appearance: Awake alert.  In no distress Resp: Clear to auscultation bilaterally.  Normal effort Cardio: Clear systolic murmur left sternal border, without rubs/gallops GI: Abdomen is soft.  Nontender nondistended.  Bowel sounds are present normal.  No masses organomegaly Extremities:Scant to 1+ pitting edema Neurologic: Alert and oriented x3.  No focal neurological deficits.  Medications:  Scheduled Meds:  allopurinol  200 mg Oral BID   apixaban  5 mg Oral BID    ARIPiprazole  5 mg Oral QHS   atorvastatin  40 mg Oral Daily   busPIRone  10 mg Oral TID   dapagliflozin propanediol  10 mg Oral Q breakfast   diltiazem  120 mg Oral Daily   fenofibrate  160 mg Oral Daily   fluticasone furoate-vilanterol  1 puff Inhalation Daily   And   umeclidinium bromide  1 puff Inhalation Daily   insulin aspart  0-15 Units Subcutaneous TID WC   insulin aspart  0-5 Units Subcutaneous QHS   insulin glargine-yfgn  30 Units Subcutaneous Daily   lacosamide  150 mg Oral BID   levETIRAcetam  500 mg Oral BID   metolazone  2.5 mg Oral Q Tue   metoprolol succinate  50 mg Oral Daily   mirtazapine  15 mg Oral QHS   pantoprazole  40 mg Oral BID   prazosin  2 mg Oral QHS   pregabalin  150 mg Oral TID   sodium chloride flush  3 mL Intravenous Q12H   sodium chloride flush  3 mL Intravenous Q12H   spironolactone  12.5 mg Oral Daily   torsemide  20 mg Oral Daily   traZODone  200 mg Oral QHS   venlafaxine XR  225 mg Oral Q breakfast  Continuous Infusions:     Diet Order             Diet heart healthy/carb modified Room service appropriate? Yes; Fluid consistency: Thin  Diet effective now                  Intake/Output Summary (Last 24 hours) at 01/29/2023 0726 Last data filed at 01/29/2023 0052 Gross per 24 hour  Intake 771 ml  Output 2925 ml  Net -2154 ml    Net IO Since Admission: -13,374 mL [01/29/23 0726]  Wt Readings from Last 3 Encounters:  01/29/23 (!) 165.1 kg  01/19/23 (!) 163.3 kg  01/12/23 (!) 162.8 kg     Data Reviewed: I have personally reviewed following labs and reports of imaging studies  CBC: Recent Labs  Lab 01/22/23 2054 01/23/23 0538 01/24/23 0220 01/26/23 1146 01/26/23 1156 01/26/23 1159 01/27/23 0238 01/28/23 0224  WBC 13.0* 11.8* 9.6  --   --   --  10.3 10.0  NEUTROABS  --  8.5*  --   --   --   --   --   --   HGB 14.1 13.5 13.4 14.6 14.6 15.3 14.2 13.7  HCT 44.2 41.9 41.6 43.0 43.0 45.0 44.9 42.5  MCV 92.1 91.3 91.0  --    --   --  91.4 88.9  PLT 269 260 242  --   --   --  268 265    Basic Metabolic Panel: Recent Labs  Lab 01/23/23 0538 01/24/23 0220 01/25/23 0023 01/26/23 0204 01/26/23 1146 01/26/23 1156 01/26/23 1159 01/27/23 0238 01/28/23 0224 01/29/23 0321  NA 139   < > 139 137   < > 141 141 137 134* 135  K 3.3*   < > 3.7 3.1*   < > 3.6 3.7 4.0 3.9 3.7  CL 99   < > 97* 97*  --   --   --  99 98 100  CO2 30   < > 32 30  --   --   --  30 27 26   GLUCOSE 92   < >  125* 149*  --   --   --  112* 112* 102*  BUN 28*   < > 19 22  --   --   --  16 17 18   CREATININE 0.90   < > 0.85 0.93  --   --   --  0.84 0.89 0.82  CALCIUM 8.4*   < > 9.0 8.8*  --   --   --  8.9 8.8* 8.6*  MG 2.0  --  2.1  --   --   --   --   --   --   --   PHOS 3.3  --   --   --   --   --   --   --   --   --    < > = values in this interval not displayed.    GFR: Estimated Creatinine Clearance: 146.6 mL/min (by C-G formula based on SCr of 0.82 mg/dL).  Liver Function Tests: Recent Labs  Lab 01/23/23 0538  AST 14*  ALT 19  ALKPHOS 72  BILITOT 0.5  PROT 6.5  ALBUMIN 3.2*    Recent Labs  Lab 01/27/23 2108 01/28/23 0722 01/28/23 1241 01/28/23 1616 01/28/23 2155  GLUCAP 170* 152* 123* 131* 129*    Recent Results (from the past 240 hour(s))  Respiratory (~20 pathogens) panel by PCR     Status: None   Collection Time: 01/23/23  8:35 AM   Specimen: Nasopharyngeal Swab; Respiratory  Result Value Ref Range Status   Adenovirus NOT DETECTED NOT DETECTED Final   Coronavirus 229E NOT DETECTED NOT DETECTED Final    Comment: (NOTE) The Coronavirus on the Respiratory Panel, DOES NOT test for the novel  Coronavirus (2019 nCoV)    Coronavirus HKU1 NOT DETECTED NOT DETECTED Final   Coronavirus NL63 NOT DETECTED NOT DETECTED Final   Coronavirus OC43 NOT DETECTED NOT DETECTED Final   Metapneumovirus NOT DETECTED NOT DETECTED Final   Rhinovirus / Enterovirus NOT DETECTED NOT DETECTED Final   Influenza A NOT DETECTED NOT  DETECTED Final   Influenza B NOT DETECTED NOT DETECTED Final   Parainfluenza Virus 1 NOT DETECTED NOT DETECTED Final   Parainfluenza Virus 2 NOT DETECTED NOT DETECTED Final   Parainfluenza Virus 3 NOT DETECTED NOT DETECTED Final   Parainfluenza Virus 4 NOT DETECTED NOT DETECTED Final   Respiratory Syncytial Virus NOT DETECTED NOT DETECTED Final   Bordetella pertussis NOT DETECTED NOT DETECTED Final   Bordetella Parapertussis NOT DETECTED NOT DETECTED Final   Chlamydophila pneumoniae NOT DETECTED NOT DETECTED Final   Mycoplasma pneumoniae NOT DETECTED NOT DETECTED Final    Comment: Performed at [facility], [address] [place], [place] [address]  SARS Coronavirus 2 by RT PCR (hospital order, performed in [facility] hospital lab) *cepheid single result test* Anterior Nasal Swab     Status: None   Collection Time: 01/23/23  8:35 AM   Specimen: Anterior Nasal Swab  Result Value Ref Range Status   SARS Coronavirus 2 by RT PCR NEGATIVE NEGATIVE Final    Comment: Performed at [facility], [address] [place], [place] [address]    Antimicrobials: Anti-infectives (From admission, onward)    Start     Dose/Rate Route Frequency Ordered Stop   01/22/23 0000  cephALEXin (KEFLEX) 500 MG capsule        500 mg Oral 4 times daily 01/22/23 2347        Culture/Microbiology    Component Value  Date/Time   SDES URINE, CLEAN CATCH 08/07/2021 2100   SPECREQUEST NONE 08/07/2021 2100   CULT (A) 08/07/2021 2100    <10,000 COLONIES/mL INSIGNIFICANT GROWTH Performed at [facility], [address], [place], [place] [address]    REPTSTATUS 08/09/2021 FINAL 08/07/2021 2100   Radiology Studies: CT Angio Abd/Pel w/ and/or w/o  Result Date: 01/28/2023 CLINICAL DATA:  Inpatient. Aortic valve replacement (TAVR), pre-op eval. Aortic stenosis. EXAM: CT ANGIOGRAPHY CHEST, ABDOMEN AND PELVIS TECHNIQUE: Multidetector CT imaging through the chest, abdomen and pelvis was performed using  the standard protocol during bolus administration of intravenous contrast. Multiplanar reconstructed images and MIPs were obtained and reviewed to evaluate the vascular anatomy. RADIATION DOSE REDUCTION: This exam was performed according to the departmental dose-optimization program which includes automated exposure control, adjustment of the mA and/or kV according to patient size and/or use of iterative reconstruction technique. CONTRAST:  OMNIPAQUE IOHEXOL 350 MG/ML SOLN COMPARISON:  01/23/2023 and 10/26/2022 chest CT angiogram. 02/04/2017 CT pelvis. FINDINGS: CTA CHEST FINDINGS Cardiovascular: Normal heart size. No significant pericardial effusion/thickening. Diffuse thickening and coarse calcification of the aortic valve. Three-vessel coronary atherosclerosis. Atherosclerotic thoracic aorta with dilated 4.0 cm ascending thoracic aorta. Normal caliber pulmonary arteries. No central pulmonary emboli. Mediastinum/Nodes: No significant thyroid nodules. Unremarkable esophagus. No axillary adenopathy. Mildly enlarged 1.1 cm subcarinal node (series 4/image 77), not definitely changed from 10/26/2022 CT. No additional pathologically enlarged mediastinal nodes. Mildly enlarged 1.2 cm right hilar node (series 4/image 70) and 1.0 cm left hilar node (series 4/image 82), unchanged. Lungs/Pleura: No pneumothorax. No pleural effusion. Mild paraseptal and centrilobular emphysema with mild diffuse bronchial wall thickening. No acute consolidative airspace disease or lung masses. No central airway stenoses. Solid 0.5 cm posterior right upper lobe pulmonary nodule (series 5/image 90), stable since at least 09/09/2019 chest CT and considered benign. No new significant pulmonary nodules. Mild hypoventilatory changes in the lower lobes. Musculoskeletal: No aggressive appearing focal osseous lesions. Moderate thoracic spondylosis. CTA ABDOMEN AND PELVIS FINDINGS Hepatobiliary: Normal liver size. Simple 3.8 cm anterior right  liver cyst. No additional liver lesions. Normal gallbladder with no radiopaque cholelithiasis. No biliary ductal dilatation. Pancreas: Normal, with no mass or duct dilation. Spleen: Normal size. No mass. Adrenals/Urinary Tract: Normal adrenals. Several scattered simple right liver cysts, largest 2.3 cm in the posterior interpolar right kidney, for which no follow-up imaging is recommended. No contour deforming left renal masses. No hydronephrosis. Normal bladder. Stomach/Bowel: Normal non-distended stomach. Normal caliber small bowel with no small bowel wall thickening. Normal appendix. Moderate left colonic diverticulosis with no large bowel wall thickening or significant pericolonic fat stranding. Vascular/Lymphatic: Atherosclerotic nonaneurysmal abdominal aorta. No pathologically enlarged lymph nodes in the abdomen or pelvis. Reproductive: Normal size prostate. Other: No pneumoperitoneum, ascites or focal fluid collection. Musculoskeletal: No aggressive appearing focal osseous lesions. Right low back spinal stimulator with leads terminating in the posterior lower thoracic spinal canal. Mild lumbar spondylosis. VASCULAR MEASUREMENTS PERTINENT TO TAVR: AORTA: Minimal Aortic Diameter-15.2 x 12.5 mm Severity of Aortic Calcification-moderate RIGHT PELVIS: Right Common Iliac Artery - Minimal Diameter-12.7 x 11.5 mm Tortuosity-mild-to-moderate Calcification-mild Right External Iliac Artery - Minimal Diameter-9.9 x 8.6 mm Tortuosity-moderate Calcification-mild Right Common Femoral Artery - Minimal Diameter-9.8 x 8.0 mm Tortuosity-mild Calcification-moderate LEFT PELVIS: Left Common Iliac Artery - Minimal Diameter-12.0 x 11.6 mm Tortuosity-mild Calcification-moderate Left External Iliac Artery - Minimal Diameter-10.7 x 10.4 mm Tortuosity-moderate Calcification-mild Left Common Femoral Artery - Minimal Diameter-10.4 x 8.3 mm Tortuosity-mild Calcification-moderate to severe Review of the MIP images  confirms the above  findings. IMPRESSION: 1. Vascular findings and measurements pertinent to potential TAVR procedure, as detailed. 2. Diffuse thickening and coarse calcification of the aortic valve, compatible with the reported history of aortic stenosis. 3. Dilated 4.0 cm ascending thoracic aorta. Recommend annual imaging followup by CTA or MRA. This recommendation follows 2010 ACCF/AHA/AATS/ACR/ASA/SCA/SCAI/SIR/STS/SVM Guidelines for the Diagnosis and Management of Patients with Thoracic Aortic Disease. Circulation. 2010; 121: N562-Z308. Aortic aneurysm NOS (ICD10-I71.9) 4. Three-vessel coronary atherosclerosis. 5. Nonspecific mild mediastinal and bilateral hilar lymphadenopathy, not definitely changed, most likely benign reactive adenopathy. 6. Moderate left colonic diverticulosis. 7.  Emphysema (ICD10-J43.9). Electronically Signed   By: Delbert Phenix M.D.   On: 01/28/2023 12:03   CT ANGIO CHEST AORTA W/CM & OR WO/CM  Result Date: 01/28/2023 CLINICAL DATA:  Inpatient. Aortic valve replacement (TAVR), pre-op eval. Aortic stenosis. EXAM: CT ANGIOGRAPHY CHEST, ABDOMEN AND PELVIS TECHNIQUE: Multidetector CT imaging through the chest, abdomen and pelvis was performed using the standard protocol during bolus administration of intravenous contrast. Multiplanar reconstructed images and MIPs were obtained and reviewed to evaluate the vascular anatomy. RADIATION DOSE REDUCTION: This exam was performed according to the departmental dose-optimization program which includes automated exposure control, adjustment of the mA and/or kV according to patient size and/or use of iterative reconstruction technique. CONTRAST:  OMNIPAQUE IOHEXOL 350 MG/ML SOLN COMPARISON:  01/23/2023 and 10/26/2022 chest CT angiogram. 02/04/2017 CT pelvis. FINDINGS: CTA CHEST FINDINGS Cardiovascular: Normal heart size. No significant pericardial effusion/thickening. Diffuse thickening and coarse calcification of the aortic valve. Three-vessel coronary  atherosclerosis. Atherosclerotic thoracic aorta with dilated 4.0 cm ascending thoracic aorta. Normal caliber pulmonary arteries. No central pulmonary emboli. Mediastinum/Nodes: No significant thyroid nodules. Unremarkable esophagus. No axillary adenopathy. Mildly enlarged 1.1 cm subcarinal node (series 4/image 77), not definitely changed from 10/26/2022 CT. No additional pathologically enlarged mediastinal nodes. Mildly enlarged 1.2 cm right hilar node (series 4/image 70) and 1.0 cm left hilar node (series 4/image 82), unchanged. Lungs/Pleura: No pneumothorax. No pleural effusion. Mild paraseptal and centrilobular emphysema with mild diffuse bronchial wall thickening. No acute consolidative airspace disease or lung masses. No central airway stenoses. Solid 0.5 cm posterior right upper lobe pulmonary nodule (series 5/image 90), stable since at least 09/09/2019 chest CT and considered benign. No new significant pulmonary nodules. Mild hypoventilatory changes in the lower lobes. Musculoskeletal: No aggressive appearing focal osseous lesions. Moderate thoracic spondylosis. CTA ABDOMEN AND PELVIS FINDINGS Hepatobiliary: Normal liver size. Simple 3.8 cm anterior right liver cyst. No additional liver lesions. Normal gallbladder with no radiopaque cholelithiasis. No biliary ductal dilatation. Pancreas: Normal, with no mass or duct dilation. Spleen: Normal size. No mass. Adrenals/Urinary Tract: Normal adrenals. Several scattered simple right liver cysts, largest 2.3 cm in the posterior interpolar right kidney, for which no follow-up imaging is recommended. No contour deforming left renal masses. No hydronephrosis. Normal bladder. Stomach/Bowel: Normal non-distended stomach. Normal caliber small bowel with no small bowel wall thickening. Normal appendix. Moderate left colonic diverticulosis with no large bowel wall thickening or significant pericolonic fat stranding. Vascular/Lymphatic: Atherosclerotic nonaneurysmal abdominal  aorta. No pathologically enlarged lymph nodes in the abdomen or pelvis. Reproductive: Normal size prostate. Other: No pneumoperitoneum, ascites or focal fluid collection. Musculoskeletal: No aggressive appearing focal osseous lesions. Right low back spinal stimulator with leads terminating in the posterior lower thoracic spinal canal. Mild lumbar spondylosis. VASCULAR MEASUREMENTS PERTINENT TO TAVR: AORTA: Minimal Aortic Diameter-15.2 x 12.5 mm Severity of Aortic Calcification-moderate RIGHT PELVIS: Right Common Iliac Artery - Minimal Diameter-12.7 x 11.5 mm Tortuosity-mild-to-moderate  Calcification-mild Right External Iliac Artery - Minimal Diameter-9.9 x 8.6 mm Tortuosity-moderate Calcification-mild Right Common Femoral Artery - Minimal Diameter-9.8 x 8.0 mm Tortuosity-mild Calcification-moderate LEFT PELVIS: Left Common Iliac Artery - Minimal Diameter-12.0 x 11.6 mm Tortuosity-mild Calcification-moderate Left External Iliac Artery - Minimal Diameter-10.7 x 10.4 mm Tortuosity-moderate Calcification-mild Left Common Femoral Artery - Minimal Diameter-10.4 x 8.3 mm Tortuosity-mild Calcification-moderate to severe Review of the MIP images confirms the above findings. IMPRESSION: 1. Vascular findings and measurements pertinent to potential TAVR procedure, as detailed. 2. Diffuse thickening and coarse calcification of the aortic valve, compatible with the reported history of aortic stenosis. 3. Dilated 4.0 cm ascending thoracic aorta. Recommend annual imaging followup by CTA or MRA. This recommendation follows 2010 ACCF/AHA/AATS/ACR/ASA/SCA/SCAI/SIR/STS/SVM Guidelines for the Diagnosis and Management of Patients with Thoracic Aortic Disease. Circulation. 2010; 121: Z610-R604. Aortic aneurysm NOS (ICD10-I71.9) 4. Three-vessel coronary atherosclerosis. 5. Nonspecific mild mediastinal and bilateral hilar lymphadenopathy, not definitely changed, most likely benign reactive adenopathy. 6. Moderate left colonic  diverticulosis. 7.  Emphysema (ICD10-J43.9). Electronically Signed   By: Delbert Phenix M.D.   On: 01/28/2023 12:03   CT CORONARY MORPH W/CTA COR W/SCORE W/CA W/CM &/OR WO/CM  Addendum Date: 01/28/2023   ADDENDUM REPORT: 01/28/2023 11:30 EXAM: OVER-READ INTERPRETATION  CT CHEST The following report is an over-read performed by radiologist Dr. Lesia Hausen [facility] on 01/28/2023. This over-read does not include interpretation of cardiac or coronary anatomy or pathology. The cardiac CTA interpretation by the cardiologist is attached. COMPARISON:  10/26/2022 chest CT angiogram. FINDINGS: Please see the separate concurrent chest CT angiogram report for details. IMPRESSION: Please see the separate concurrent chest CT angiogram report for details. Electronically Signed   By: Delbert Phenix M.D.   On: 01/28/2023 11:30   Result Date: 01/28/2023 CLINICAL DATA:  60M with severe aortic stenosis being evaluated for a TAVR procedure. EXAM: Cardiac TAVR CT TECHNIQUE: The patient was scanned on a [employer]. A 120 kV retrospective scan was triggered in the descending thoracic aorta at 111 HU's. Gantry rotation speed was 250 msecs and collimation was .6 mm. No beta blockade or nitro were given. The 3D data set was reconstructed in 5% intervals of the R-R cycle. Systolic and diastolic phases were analyzed on a dedicated work station using MPR, MIP and VRT modes. The patient received 100 cc of contrast. FINDINGS: Aortic valve: Trileaflet. Poor contrast opacification, unable to make TAVR measurements Aortic valve calcium score: 3685 Cardiac: Right atrium: Mild enlargement. Lipomatous hypertrophy of interatrial septum Right ventricle: Mild enlargement Pulmonary arteries: Normal size Pulmonary veins: Normal configuration Left atrium: Mild enlargement Left ventricle: Normal size Pericardium: Normal thickness Coronary arteries: Calcium score 4290 (99th percentile) IMPRESSION: 1. During contrast injection, IV  pressure limit was reached, resulting in low contrast injection rate and very poor contrast opacification. Contrast is not sufficient to make TAVR measurements. Recommend repeating study. 2. Tricuspid aortic valve with severe calcifications (AV calcium score 3685) 3.  Coronary calcium score 4290 (99th percentile) Electronically Signed: By: Epifanio Lesches M.D. On: 01/27/2023 17:48   CT MAXILLOFACIAL WO CONTRAST  Result Date: 01/27/2023 CLINICAL DATA:  TMJ pain or limited movement.  Poor dentition. EXAM: CT MAXILLOFACIAL WITHOUT CONTRAST TECHNIQUE: Multidetector CT imaging of the maxillofacial structures was performed. Multiplanar CT image reconstructions were also generated. RADIATION DOSE REDUCTION: This exam was performed according to the departmental dose-optimization program which includes automated exposure control, adjustment of the mA and/or kV according to patient size and/or use of iterative reconstruction technique. COMPARISON:  Head and neck CTA 05/25/2022 FINDINGS: Osseous: Poor dentition with numerous dental caries including prominent involvement of mandibular and maxillary molars bilaterally where there are associated periapical erosions. No acute inflammatory changes are identified in the overlying soft tissues. No acute fracture. Orbits: Unremarkable. Sinuses: Mild-to-moderate mucosal thickening in the paranasal sinuses, greatest in the ethmoid air cells bilaterally. Minimal fluid in the sphenoid sinuses. Clear mastoid air cells. Soft tissues: Unremarkable. Limited intracranial: Unremarkable. IMPRESSION: Poor dentition with numerous dental caries and periapical erosions. No evidence of overlying acute soft tissue inflammation/infection. Electronically Signed   By: Sebastian Ache M.D.   On: 01/27/2023 16:50     LOS: 6 days   Azucena Fallen, DO [facility]  01/29/2023, 7:26 AM

## 2023-01-29 NOTE — Progress Notes (Signed)
Mobility Specialist Progress Note    01/29/23 1311  Mobility  Activity Ambulated with assistance to bathroom  Level of Assistance Moderate assist, patient does 50-74%  Assistive Device Front wheel walker  Distance Ambulated (ft) 40 ft (20+20)  Activity Response Tolerated well  Mobility Referral Yes  $Mobility charge 1 Mobility  Mobility Specialist Start Time (ACUTE ONLY) 1255  Mobility Specialist Stop Time (ACUTE ONLY) 1310  Mobility Specialist Time Calculation (min) (ACUTE ONLY) 15 min   Pre-Mobility: 91 HR Post-Mobility: 82 HR  Pt received in chair and agreeable. No complaints. Had BM. Returned to chair with call bell in reach. RN aware.   [place] Mobility Specialist  Please Neurosurgeon or Rehab Office at [phone]

## 2023-01-29 NOTE — Progress Notes (Addendum)
Oncoming RN notified that patient had incontinence of urine during the night.  Patient sleeping during bedside report but aroused with physical touch to shoulder.  Patient addressed nurses by name, discussed already eating breakfast.  Short time later RN notified by NT that patient had knocked over his breakfast tray in the bed.  This is different than his normal behaviors observed on shift yesterday 6/23.  Patient has received a total of 4 doses of oxyIR this admission for right knee and right great toe pain.  Spoke to Dr Natale Milch, he stated he would assess patient shortly.

## 2023-01-29 NOTE — Progress Notes (Signed)
Rounding Note    Patient Name: Lawrence [name] Date of Encounter: 01/29/2023  [facility] Cardiologist: Armanda Magic, MD   Subjective   Has had issues with falling asleep easily. Was occurring at home as well prior to admission. Doesn't tolerate CPAP. Per nursing note, had some incontinence overnight, knocked over breakfast tray this AM. Patient denies confusion.  Inpatient Medications    Scheduled Meds:  allopurinol  200 mg Oral BID   apixaban  5 mg Oral BID   ARIPiprazole  5 mg Oral QHS   atorvastatin  40 mg Oral Daily   busPIRone  10 mg Oral TID   dapagliflozin propanediol  10 mg Oral Q breakfast   diclofenac Sodium  4 g Topical QID   diltiazem  120 mg Oral Daily   fenofibrate  160 mg Oral Daily   fluticasone furoate-vilanterol  1 puff Inhalation Daily   And   umeclidinium bromide  1 puff Inhalation Daily   insulin aspart  0-15 Units Subcutaneous TID WC   insulin aspart  0-5 Units Subcutaneous QHS   insulin glargine-yfgn  30 Units Subcutaneous Daily   lacosamide  150 mg Oral BID   levETIRAcetam  500 mg Oral BID   metolazone  2.5 mg Oral Q Tue   metoprolol succinate  50 mg Oral Daily   mirtazapine  15 mg Oral QHS   pantoprazole  40 mg Oral BID   prazosin  2 mg Oral QHS   pregabalin  150 mg Oral TID   sodium chloride flush  3 mL Intravenous Q12H   sodium chloride flush  3 mL Intravenous Q12H   spironolactone  12.5 mg Oral Daily   torsemide  20 mg Oral Daily   traZODone  200 mg Oral QHS   venlafaxine XR  225 mg Oral Q breakfast   Continuous Infusions:  PRN Meds: acetaminophen **OR** acetaminophen, albuterol, melatonin, morphine injection, ondansetron (ZOFRAN) IV   Vital Signs    Vitals:   01/29/23 0359 01/29/23 0756 01/29/23 0812 01/29/23 0813  BP: 117/82 111/79    Pulse: 66 73    Resp: 20 20    Temp: 97.6 F (36.4 C) 97.8 F (36.6 C)    TempSrc: Oral Oral    SpO2: 92% 93% 96% 96%  Weight: (!) 165.1 kg     Height:        Intake/Output  Summary (Last 24 hours) at 01/29/2023 0945 Last data filed at 01/29/2023 0847 Gross per 24 hour  Intake 771 ml  Output 2925 ml  Net -2154 ml      01/29/2023    3:59 AM 01/28/2023    4:57 AM 01/26/2023    5:00 AM  Last 3 Weights  Weight (lbs) 364 lb 366 lb 6.5 oz 362 lb  Weight (kg) 165.109 kg 166.2 kg 164.202 kg      Telemetry    SR with PVCs - Personally Reviewed  ECG    01/27/23 Sinus tach with 2 PVCs at 102 bpm - Personally Reviewed  Physical Exam   GEN: No acute distress.  O2 by Morton in place.  Neck: JVD not appreciated sitting upright Cardiac: RRR, no  rubs, or gallops. 2/6 SEM Respiratory: Clear to auscultation bilaterally. GI: Soft, nontender, non-distended  MS: Brawny edema bilaterally with chronic venous stasis changes Neuro:  Nonfocal  Psych: Normal affect   Labs    High Sensitivity Troponin:   Recent Labs  Lab 01/22/23 2054 01/22/23 2348  TROPONINIHS 8 8  Chemistry Recent Labs  Lab 01/23/23 0538 01/24/23 0220 01/25/23 0023 01/26/23 0204 01/27/23 0238 01/28/23 0224 01/29/23 0321  NA 139   < > 139   < > 137 134* 135  K 3.3*   < > 3.7   < > 4.0 3.9 3.7  CL 99   < > 97*   < > 99 98 100  CO2 30   < > 32   < > 30 27 26   GLUCOSE 92   < > 125*   < > 112* 112* 102*  BUN 28*   < > 19   < > 16 17 18   CREATININE 0.90   < > 0.85   < > 0.84 0.89 0.82  CALCIUM 8.4*   < > 9.0   < > 8.9 8.8* 8.6*  MG 2.0  --  2.1  --   --   --   --   PROT 6.5  --   --   --   --   --   --   ALBUMIN 3.2*  --   --   --   --   --   --   AST 14*  --   --   --   --   --   --   ALT 19  --   --   --   --   --   --   ALKPHOS 72  --   --   --   --   --   --   BILITOT 0.5  --   --   --   --   --   --   GFRNONAA >60   < > >60   < > >60 >60 >60  ANIONGAP 10   < > 10   < > 8 9 9    < > = values in this interval not displayed.    Lipids No results for input(s): "CHOL", "TRIG", "HDL", "LABVLDL", "LDLCALC", "CHOLHDL" in the last 168 hours.  Hematology Recent Labs  Lab 01/24/23 0220  01/26/23 1146 01/26/23 1159 01/27/23 0238 01/28/23 0224  WBC 9.6  --   --  10.3 10.0  RBC 4.57  --   --  4.91 4.78  HGB 13.4   < > 15.3 14.2 13.7  HCT 41.6   < > 45.0 44.9 42.5  MCV 91.0  --   --  91.4 88.9  MCH 29.3  --   --  28.9 28.7  MCHC 32.2  --   --  31.6 32.2  RDW 15.4  --   --  15.5 15.3  PLT 242  --   --  268 265   < > = values in this interval not displayed.   Thyroid No results for input(s): "TSH", "FREET4" in the last 168 hours.  BNP Recent Labs  Lab 01/22/23 2054  BNP 25.3    DDimer No results for input(s): "DDIMER" in the last 168 hours.   Radiology    CT Angio Abd/Pel w/ and/or w/o  Result Date: 01/28/2023 CLINICAL DATA:  Inpatient. Aortic valve replacement (TAVR), pre-op eval. Aortic stenosis. EXAM: CT ANGIOGRAPHY CHEST, ABDOMEN AND PELVIS TECHNIQUE: Multidetector CT imaging through the chest, abdomen and pelvis was performed using the standard protocol during bolus administration of intravenous contrast. Multiplanar reconstructed images and MIPs were obtained and reviewed to evaluate the vascular anatomy. RADIATION DOSE REDUCTION: This exam was performed according to the departmental dose-optimization program which includes automated exposure control, adjustment of the  mA and/or kV according to patient size and/or use of iterative reconstruction technique. CONTRAST:  OMNIPAQUE IOHEXOL 350 MG/ML SOLN COMPARISON:  01/23/2023 and 10/26/2022 chest CT angiogram. 02/04/2017 CT pelvis. FINDINGS: CTA CHEST FINDINGS Cardiovascular: Normal heart size. No significant pericardial effusion/thickening. Diffuse thickening and coarse calcification of the aortic valve. Three-vessel coronary atherosclerosis. Atherosclerotic thoracic aorta with dilated 4.0 cm ascending thoracic aorta. Normal caliber pulmonary arteries. No central pulmonary emboli. Mediastinum/Nodes: No significant thyroid nodules. Unremarkable esophagus. No axillary adenopathy. Mildly enlarged 1.1 cm subcarinal node  (series 4/image 77), not definitely changed from 10/26/2022 CT. No additional pathologically enlarged mediastinal nodes. Mildly enlarged 1.2 cm right hilar node (series 4/image 70) and 1.0 cm left hilar node (series 4/image 82), unchanged. Lungs/Pleura: No pneumothorax. No pleural effusion. Mild paraseptal and centrilobular emphysema with mild diffuse bronchial wall thickening. No acute consolidative airspace disease or lung masses. No central airway stenoses. Solid 0.5 cm posterior right upper lobe pulmonary nodule (series 5/image 90), stable since at least 09/09/2019 chest CT and considered benign. No new significant pulmonary nodules. Mild hypoventilatory changes in the lower lobes. Musculoskeletal: No aggressive appearing focal osseous lesions. Moderate thoracic spondylosis. CTA ABDOMEN AND PELVIS FINDINGS Hepatobiliary: Normal liver size. Simple 3.8 cm anterior right liver cyst. No additional liver lesions. Normal gallbladder with no radiopaque cholelithiasis. No biliary ductal dilatation. Pancreas: Normal, with no mass or duct dilation. Spleen: Normal size. No mass. Adrenals/Urinary Tract: Normal adrenals. Several scattered simple right liver cysts, largest 2.3 cm in the posterior interpolar right kidney, for which no follow-up imaging is recommended. No contour deforming left renal masses. No hydronephrosis. Normal bladder. Stomach/Bowel: Normal non-distended stomach. Normal caliber small bowel with no small bowel wall thickening. Normal appendix. Moderate left colonic diverticulosis with no large bowel wall thickening or significant pericolonic fat stranding. Vascular/Lymphatic: Atherosclerotic nonaneurysmal abdominal aorta. No pathologically enlarged lymph nodes in the abdomen or pelvis. Reproductive: Normal size prostate. Other: No pneumoperitoneum, ascites or focal fluid collection. Musculoskeletal: No aggressive appearing focal osseous lesions. Right low back spinal stimulator with leads terminating in  the posterior lower thoracic spinal canal. Mild lumbar spondylosis. VASCULAR MEASUREMENTS PERTINENT TO TAVR: AORTA: Minimal Aortic Diameter-15.2 x 12.5 mm Severity of Aortic Calcification-moderate RIGHT PELVIS: Right Common Iliac Artery - Minimal Diameter-12.7 x 11.5 mm Tortuosity-mild-to-moderate Calcification-mild Right External Iliac Artery - Minimal Diameter-9.9 x 8.6 mm Tortuosity-moderate Calcification-mild Right Common Femoral Artery - Minimal Diameter-9.8 x 8.0 mm Tortuosity-mild Calcification-moderate LEFT PELVIS: Left Common Iliac Artery - Minimal Diameter-12.0 x 11.6 mm Tortuosity-mild Calcification-moderate Left External Iliac Artery - Minimal Diameter-10.7 x 10.4 mm Tortuosity-moderate Calcification-mild Left Common Femoral Artery - Minimal Diameter-10.4 x 8.3 mm Tortuosity-mild Calcification-moderate to severe Review of the MIP images confirms the above findings. IMPRESSION: 1. Vascular findings and measurements pertinent to potential TAVR procedure, as detailed. 2. Diffuse thickening and coarse calcification of the aortic valve, compatible with the reported history of aortic stenosis. 3. Dilated 4.0 cm ascending thoracic aorta. Recommend annual imaging followup by CTA or MRA. This recommendation follows 2010 ACCF/AHA/AATS/ACR/ASA/SCA/SCAI/SIR/STS/SVM Guidelines for the Diagnosis and Management of Patients with Thoracic Aortic Disease. Circulation. 2010; 121: Z610-R604. Aortic aneurysm NOS (ICD10-I71.9) 4. Three-vessel coronary atherosclerosis. 5. Nonspecific mild mediastinal and bilateral hilar lymphadenopathy, not definitely changed, most likely benign reactive adenopathy. 6. Moderate left colonic diverticulosis. 7.  Emphysema (ICD10-J43.9). Electronically Signed   By: Delbert Phenix M.D.   On: 01/28/2023 12:03   CT ANGIO CHEST AORTA W/CM & OR WO/CM  Result Date: 01/28/2023 CLINICAL DATA:  Inpatient. Aortic valve replacement (  TAVR), pre-op eval. Aortic stenosis. EXAM: CT ANGIOGRAPHY CHEST, ABDOMEN  AND PELVIS TECHNIQUE: Multidetector CT imaging through the chest, abdomen and pelvis was performed using the standard protocol during bolus administration of intravenous contrast. Multiplanar reconstructed images and MIPs were obtained and reviewed to evaluate the vascular anatomy. RADIATION DOSE REDUCTION: This exam was performed according to the departmental dose-optimization program which includes automated exposure control, adjustment of the mA and/or kV according to patient size and/or use of iterative reconstruction technique. CONTRAST:  OMNIPAQUE IOHEXOL 350 MG/ML SOLN COMPARISON:  01/23/2023 and 10/26/2022 chest CT angiogram. 02/04/2017 CT pelvis. FINDINGS: CTA CHEST FINDINGS Cardiovascular: Normal heart size. No significant pericardial effusion/thickening. Diffuse thickening and coarse calcification of the aortic valve. Three-vessel coronary atherosclerosis. Atherosclerotic thoracic aorta with dilated 4.0 cm ascending thoracic aorta. Normal caliber pulmonary arteries. No central pulmonary emboli. Mediastinum/Nodes: No significant thyroid nodules. Unremarkable esophagus. No axillary adenopathy. Mildly enlarged 1.1 cm subcarinal node (series 4/image 77), not definitely changed from 10/26/2022 CT. No additional pathologically enlarged mediastinal nodes. Mildly enlarged 1.2 cm right hilar node (series 4/image 70) and 1.0 cm left hilar node (series 4/image 82), unchanged. Lungs/Pleura: No pneumothorax. No pleural effusion. Mild paraseptal and centrilobular emphysema with mild diffuse bronchial wall thickening. No acute consolidative airspace disease or lung masses. No central airway stenoses. Solid 0.5 cm posterior right upper lobe pulmonary nodule (series 5/image 90), stable since at least 09/09/2019 chest CT and considered benign. No new significant pulmonary nodules. Mild hypoventilatory changes in the lower lobes. Musculoskeletal: No aggressive appearing focal osseous lesions. Moderate thoracic  spondylosis. CTA ABDOMEN AND PELVIS FINDINGS Hepatobiliary: Normal liver size. Simple 3.8 cm anterior right liver cyst. No additional liver lesions. Normal gallbladder with no radiopaque cholelithiasis. No biliary ductal dilatation. Pancreas: Normal, with no mass or duct dilation. Spleen: Normal size. No mass. Adrenals/Urinary Tract: Normal adrenals. Several scattered simple right liver cysts, largest 2.3 cm in the posterior interpolar right kidney, for which no follow-up imaging is recommended. No contour deforming left renal masses. No hydronephrosis. Normal bladder. Stomach/Bowel: Normal non-distended stomach. Normal caliber small bowel with no small bowel wall thickening. Normal appendix. Moderate left colonic diverticulosis with no large bowel wall thickening or significant pericolonic fat stranding. Vascular/Lymphatic: Atherosclerotic nonaneurysmal abdominal aorta. No pathologically enlarged lymph nodes in the abdomen or pelvis. Reproductive: Normal size prostate. Other: No pneumoperitoneum, ascites or focal fluid collection. Musculoskeletal: No aggressive appearing focal osseous lesions. Right low back spinal stimulator with leads terminating in the posterior lower thoracic spinal canal. Mild lumbar spondylosis. VASCULAR MEASUREMENTS PERTINENT TO TAVR: AORTA: Minimal Aortic Diameter-15.2 x 12.5 mm Severity of Aortic Calcification-moderate RIGHT PELVIS: Right Common Iliac Artery - Minimal Diameter-12.7 x 11.5 mm Tortuosity-mild-to-moderate Calcification-mild Right External Iliac Artery - Minimal Diameter-9.9 x 8.6 mm Tortuosity-moderate Calcification-mild Right Common Femoral Artery - Minimal Diameter-9.8 x 8.0 mm Tortuosity-mild Calcification-moderate LEFT PELVIS: Left Common Iliac Artery - Minimal Diameter-12.0 x 11.6 mm Tortuosity-mild Calcification-moderate Left External Iliac Artery - Minimal Diameter-10.7 x 10.4 mm Tortuosity-moderate Calcification-mild Left Common Femoral Artery - Minimal Diameter-10.4  x 8.3 mm Tortuosity-mild Calcification-moderate to severe Review of the MIP images confirms the above findings. IMPRESSION: 1. Vascular findings and measurements pertinent to potential TAVR procedure, as detailed. 2. Diffuse thickening and coarse calcification of the aortic valve, compatible with the reported history of aortic stenosis. 3. Dilated 4.0 cm ascending thoracic aorta. Recommend annual imaging followup by CTA or MRA. This recommendation follows 2010 ACCF/AHA/AATS/ACR/ASA/SCA/SCAI/SIR/STS/SVM Guidelines for the Diagnosis and Management of Patients with Thoracic Aortic Disease. Circulation. 2010;  121: [id number]. Aortic aneurysm NOS (ICD10-I71.9) 4. Three-vessel coronary atherosclerosis. 5. Nonspecific mild mediastinal and bilateral hilar lymphadenopathy, not definitely changed, most likely benign reactive adenopathy. 6. Moderate left colonic diverticulosis. 7.  Emphysema (ICD10-J43.9). Electronically Signed   By: Delbert Phenix M.D.   On: 01/28/2023 12:03   CT CORONARY MORPH W/CTA COR W/SCORE W/CA W/CM &/OR WO/CM  Addendum Date: 01/28/2023   ADDENDUM REPORT: 01/28/2023 11:30 EXAM: OVER-READ INTERPRETATION  CT CHEST The following report is an over-read performed by radiologist Dr. Lesia Hausen [facility] on 01/28/2023. This over-read does not include interpretation of cardiac or coronary anatomy or pathology. The cardiac CTA interpretation by the cardiologist is attached. COMPARISON:  10/26/2022 chest CT angiogram. FINDINGS: Please see the separate concurrent chest CT angiogram report for details. IMPRESSION: Please see the separate concurrent chest CT angiogram report for details. Electronically Signed   By: Delbert Phenix M.D.   On: 01/28/2023 11:30   Result Date: 01/28/2023 CLINICAL DATA:  58M with severe aortic stenosis being evaluated for a TAVR procedure. EXAM: Cardiac TAVR CT TECHNIQUE: The patient was scanned on a [employer]. A 120 kV retrospective scan was triggered in the  descending thoracic aorta at 111 HU's. Gantry rotation speed was 250 msecs and collimation was .6 mm. No beta blockade or nitro were given. The 3D data set was reconstructed in 5% intervals of the R-R cycle. Systolic and diastolic phases were analyzed on a dedicated work station using MPR, MIP and VRT modes. The patient received 100 cc of contrast. FINDINGS: Aortic valve: Trileaflet. Poor contrast opacification, unable to make TAVR measurements Aortic valve calcium score: 3685 Cardiac: Right atrium: Mild enlargement. Lipomatous hypertrophy of interatrial septum Right ventricle: Mild enlargement Pulmonary arteries: Normal size Pulmonary veins: Normal configuration Left atrium: Mild enlargement Left ventricle: Normal size Pericardium: Normal thickness Coronary arteries: Calcium score 4290 (99th percentile) IMPRESSION: 1. During contrast injection, IV pressure limit was reached, resulting in low contrast injection rate and very poor contrast opacification. Contrast is not sufficient to make TAVR measurements. Recommend repeating study. 2. Tricuspid aortic valve with severe calcifications (AV calcium score 3685) 3.  Coronary calcium score 4290 (99th percentile) Electronically Signed: By: Epifanio Lesches M.D. On: 01/27/2023 17:48   CT MAXILLOFACIAL WO CONTRAST  Result Date: 01/27/2023 CLINICAL DATA:  TMJ pain or limited movement.  Poor dentition. EXAM: CT MAXILLOFACIAL WITHOUT CONTRAST TECHNIQUE: Multidetector CT imaging of the maxillofacial structures was performed. Multiplanar CT image reconstructions were also generated. RADIATION DOSE REDUCTION: This exam was performed according to the departmental dose-optimization program which includes automated exposure control, adjustment of the mA and/or kV according to patient size and/or use of iterative reconstruction technique. COMPARISON:  Head and neck CTA 05/25/2022 FINDINGS: Osseous: Poor dentition with numerous dental caries including prominent involvement of  mandibular and maxillary molars bilaterally where there are associated periapical erosions. No acute inflammatory changes are identified in the overlying soft tissues. No acute fracture. Orbits: Unremarkable. Sinuses: Mild-to-moderate mucosal thickening in the paranasal sinuses, greatest in the ethmoid air cells bilaterally. Minimal fluid in the sphenoid sinuses. Clear mastoid air cells. Soft tissues: Unremarkable. Limited intracranial: Unremarkable. IMPRESSION: Poor dentition with numerous dental caries and periapical erosions. No evidence of overlying acute soft tissue inflammation/infection. Electronically Signed   By: Sebastian Ache M.D.   On: 01/27/2023 16:50    Cardiac Studies   Echo, cath reviewed  Patient Profile     67 y.o. male with a history of non-obstructive CAD noted on cardiac catheterization in  2019, chronic diastolic CHF, paroxysmal atrial fibrillation on Eliquis, severe aortic stenosis noted on recent Echo in 12/2022, hypertension, hyperlipidemia, type 2 diabetes mellitus with peripheral neuropathy, COPD, obstructive sleep apnea on CPAP, seizure disorder, and prior tobacco use (quit about 6 years ago) who is being seen for the evaluation of CHF at the request of Dr. Madelyn Flavors.   Assessment & Plan    Acute hypoxic respiratory failure acute on chronic diastolic CHF severe aortic stenosis - Echo 12/22/2022: EF 60-65%, severe AS  - diuresed well with IV Lasix (also takes metolazone once a week on Tuesdays). Restarted oral torsemide 6/22 -charted net negative 13 L, but intake not well documented. Weight up slightly from admission. Wedge 14 on cath. -CT TAVR attempted 6/21, unable to get good opacification, plan to repeat on 6/24 -TAVR team following, planned to complete evaluation as an outpatient. Patient is hoping that since he had to stay over the weekend, he might be able to meet with the CT surgery team before he goes home. Discussed this may not be possible, can ask team in AM    Chest Pain, atypical non-obstructive CAD, HLD - chronic intermittent atypical CP for 6-7 months  - negative troponins for similar discomfort on admission, cath with nonobstructive disease - analgesia with Tylenol, oxycodone - continue PPI - continue statin   H/o PAF (on OAC PTA) with PVCs on telemetry - restarted apixaban 6/22 - occasional PVCs on telemetry - continue BB as tolerated   Otherwise, per primary team: - COPD - Type 2 diabetes mellitus  - Seizure disorder - Anxiety and depression - OSA on CPAP  For questions or updates, please contact [facility] Please consult [email or web address] for contact info under        Signed, Jodelle Red, MD  01/29/2023, 9:45 AM

## 2023-01-30 ENCOUNTER — Encounter: Payer: Medicare HMO | Admitting: Surgery

## 2023-01-30 ENCOUNTER — Inpatient Hospital Stay (HOSPITAL_COMMUNITY): Payer: Medicare HMO

## 2023-01-30 ENCOUNTER — Encounter (HOSPITAL_COMMUNITY): Payer: Self-pay | Admitting: Internal Medicine

## 2023-01-30 DIAGNOSIS — I350 Nonrheumatic aortic (valve) stenosis: Secondary | ICD-10-CM | POA: Diagnosis not present

## 2023-01-30 DIAGNOSIS — E1165 Type 2 diabetes mellitus with hyperglycemia: Secondary | ICD-10-CM | POA: Diagnosis not present

## 2023-01-30 DIAGNOSIS — J9601 Acute respiratory failure with hypoxia: Secondary | ICD-10-CM | POA: Diagnosis not present

## 2023-01-30 LAB — GLUCOSE, CAPILLARY
Glucose-Capillary: 182 mg/dL — ABNORMAL HIGH (ref 70–99)
Glucose-Capillary: 117 mg/dL — ABNORMAL HIGH (ref 70–99)
Glucose-Capillary: 124 mg/dL — ABNORMAL HIGH (ref 70–99)
Glucose-Capillary: 139 mg/dL — ABNORMAL HIGH (ref 70–99)

## 2023-01-30 LAB — BASIC METABOLIC PANEL
Anion gap: 9 (ref 5–15)
BUN: 15 mg/dL (ref 8–23)
CO2: 27 mmol/L (ref 22–32)
Calcium: 8.6 mg/dL — ABNORMAL LOW (ref 8.9–10.3)
Chloride: 100 mmol/L (ref 98–111)
Creatinine, Ser: 0.76 mg/dL (ref 0.61–1.24)
GFR, Estimated: >60 mL/min (ref >60)
Glucose, Bld: 101 mg/dL — ABNORMAL HIGH (ref 70–99)
Potassium: 3.4 mmol/L — ABNORMAL LOW (ref 3.5–5.1)
Sodium: 136 mmol/L (ref 135–145)

## 2023-01-30 MED ORDER — IOHEXOL 350 MG/ML SOLN
100.0000 mL | Freq: Once | INTRAVENOUS | Status: AC | PRN
  Administered 2023-01-30: 100 mL via INTRAVENOUS

## 2023-01-30 MED ORDER — METOLAZONE 5 MG PO TABS
5.0000 mg | ORAL_TABLET | Freq: Once | ORAL | Status: AC
  Administered 2023-01-31: 5 mg via ORAL
  Filled 2023-01-30: qty 1

## 2023-01-30 MED ORDER — POTASSIUM CHLORIDE CRYS ER 20 MEQ PO TBCR
40.0000 meq | EXTENDED_RELEASE_TABLET | Freq: Once | ORAL | Status: AC
  Administered 2023-01-30: 40 meq via ORAL
  Filled 2023-01-30: qty 2

## 2023-01-30 MED ORDER — POTASSIUM CHLORIDE CRYS ER 20 MEQ PO TBCR
20.0000 meq | EXTENDED_RELEASE_TABLET | Freq: Once | ORAL | Status: AC
  Administered 2023-01-30: 20 meq via ORAL
  Filled 2023-01-30: qty 1

## 2023-01-30 MED ORDER — POTASSIUM CHLORIDE CRYS ER 20 MEQ PO TBCR: 40.0000 meq | EXTENDED_RELEASE_TABLET | Freq: Once | ORAL | Status: DC

## 2023-01-30 NOTE — Progress Notes (Signed)
Physical Therapy Treatment Patient Details Name: Lawrence [name] MRN: [id number] DOB: [birth year] Today's Date: 01/30/2023   History of Present Illness Pt is a 67 y/o male who presents 01/22/23 with SOB and intermittent L sided CP for ~2 weeks PTA. R/L heart cath scheduled for 6/20 while inpatient. S/P heart cath 6/20. PMH significant for HTN, CHF, PAF on Eliquis, aortic stenosis, CAD, CVA, DM II, COPD, AAA.    PT Comments    Patient able to progress ambulation in hallway with rollator.  Seems to feel more comfortable with rollator as he uses it at baseline.  Significant L knee deformity with pain limiting ambulation in addition to SOB. Noted SpO2 90% after ambulation on 2L O2.  Feel he will continue to benefit from skilled PT in the acute setting.  HHPT recommendations remain appropriate.    Recommendations for follow up therapy are one component of a multi-disciplinary discharge planning process, led by the attending physician.  Recommendations may be updated based on patient status, additional functional criteria and insurance authorization.  Follow Up Recommendations       Assistance Recommended at Discharge Intermittent Supervision/Assistance  Patient can return home with the following A little help with walking and/or transfers;A little help with bathing/dressing/bathroom;Assistance with cooking/housework;Assist for transportation;Help with stairs or ramp for entrance   Equipment Recommendations  None recommended by PT    Recommendations for Other Services       Precautions / Restrictions Precautions Precautions: Fall Precaution Comments: monitor HR and sats     Mobility  Bed Mobility Overal bed mobility: Needs Assistance Bed Mobility: Supine to Sit     Supine to sit: Min assist     General bed mobility comments: HHA for lifting trunk    Transfers Overall transfer level: Needs assistance Equipment used: Rollator (4 wheels) Transfers: Sit to/from Stand Sit to  Stand: From elevated surface, Min assist           General transfer comment: using momentum and higher surface to stand    Ambulation/Gait Ambulation/Gait assistance: Min guard Gait Distance (Feet): 76 Feet Assistive device: Rollator (4 wheels) Gait Pattern/deviations: Step-to pattern, Step-through pattern, Decreased stride length, Antalgic, Trunk flexed, Wide base of support       General Gait Details: leaning heavily on rollator for ambulation noting severe R knee genuvarus deformity with pt reporting pain in knee throughout. Off loading some using rollator, on 2L O2 throughout   Stairs             Wheelchair Mobility    Modified Rankin (Stroke Patients Only)       Balance Overall balance assessment: Needs assistance   Sitting balance-Leahy Scale: Good Sitting balance - Comments: leaning back to lift foot for donning socks at EOB without LOB   Standing balance support: Bilateral upper extremity supported Standing balance-Leahy Scale: Poor Standing balance comment: reliant on RW support                            Cognition Arousal/Alertness: Awake/alert Behavior During Therapy: WFL for tasks assessed/performed, Flat affect Overall Cognitive Status: Within Functional Limits for tasks assessed                                          Exercises      General Comments General comments (skin integrity, edema, etc.): SpO2 90% after ambulation  in hallway on 2L O2; HR initially reading 53 after ambulation with artifact then 82      Pertinent Vitals/Pain Pain Assessment Pain Score: 6  Pain Location: feet Pain Descriptors / Indicators: Aching, Discomfort Pain Intervention(s): Monitored during session, Repositioned, RN gave pain meds during session    Home Living                          Prior Function            PT Goals (current goals can now be found in the care plan section) Progress towards PT goals:  Progressing toward goals    Frequency    Min 1X/week      PT Plan Current plan remains appropriate    Co-evaluation              AM-PAC PT "6 Clicks" Mobility   Outcome Measure  Help needed turning from your back to your side while in a flat bed without using bedrails?: A Little Help needed moving from lying on your back to sitting on the side of a flat bed without using bedrails?: A Little Help needed moving to and from a bed to a chair (including a wheelchair)?: A Little   Help needed to walk in hospital room?: A Little Help needed climbing 3-5 steps with a railing? : Total 6 Click Score: 13    End of Session Equipment Utilized During Treatment: Gait belt;Oxygen Activity Tolerance: Patient tolerated treatment well Patient left: in chair;with call bell/phone within reach;with family/visitor present   PT Visit Diagnosis: Unsteadiness on feet (R26.81);Muscle weakness (generalized) (M62.81);Difficulty in walking, not elsewhere classified (R26.2)     Time: 1308-6578 PT Time Calculation (min) (ACUTE ONLY): 28 min  Charges:  $Gait Training: 8-22 mins $Therapeutic Activity: 8-22 mins                     Sheran Lawless, PT Acute Rehabilitation Services Office:[phone] 01/30/2023    Elray Mcgregor 01/30/2023, 2:16 PM

## 2023-01-30 NOTE — Progress Notes (Addendum)
Progress Note  Patient Name: Lawrence [name] Date of Encounter: 01/30/2023  Primary Cardiologist: Armanda Magic, MD  Subjective   Feeling overall better. Still with significant LE edema, some SOB with exertion. Still feels some 1/10 chest discomfort but much better compared to Friday when it was more intense. For re-trial of pre-TAVR CT today. Has service chihuahua at bedside.  Inpatient Medications    Scheduled Meds:  allopurinol  200 mg Oral BID   apixaban  5 mg Oral BID   ARIPiprazole  5 mg Oral QHS   atorvastatin  40 mg Oral Daily   busPIRone  10 mg Oral TID   dapagliflozin propanediol  10 mg Oral Q breakfast   diclofenac Sodium  4 g Topical QID   diltiazem  120 mg Oral Daily   fenofibrate  160 mg Oral Daily   fluticasone furoate-vilanterol  1 puff Inhalation Daily   And   umeclidinium bromide  1 puff Inhalation Daily   insulin aspart  0-15 Units Subcutaneous TID WC   insulin aspart  0-5 Units Subcutaneous QHS   insulin glargine-yfgn  30 Units Subcutaneous Daily   lacosamide  150 mg Oral BID   levETIRAcetam  500 mg Oral BID   metolazone  2.5 mg Oral Q Tue   metoprolol succinate  50 mg Oral Daily   mirtazapine  15 mg Oral QHS   pantoprazole  40 mg Oral BID   prazosin  2 mg Oral QHS   pregabalin  150 mg Oral TID   sodium chloride flush  3 mL Intravenous Q12H   sodium chloride flush  3 mL Intravenous Q12H   spironolactone  12.5 mg Oral Daily   torsemide  20 mg Oral Daily   traZODone  200 mg Oral QHS   venlafaxine XR  225 mg Oral Q breakfast   Continuous Infusions:  PRN Meds: acetaminophen **OR** acetaminophen, albuterol, melatonin, morphine injection, ondansetron (ZOFRAN) IV   Vital Signs    Vitals:   01/29/23 1640 01/29/23 2011 01/30/23 0528 01/30/23 0753  BP: (!) 106/47 116/66 105/69 102/62  Pulse: 66 70 68   Resp: 20 18 18 18   Temp:  98 F (36.7 C) 97.9 F (36.6 C) 97.9 F (36.6 C)  TempSrc:  Oral Oral Oral  SpO2: 95% 97% 96%   Weight:   (!) 164.6  kg   Height:        Intake/Output Summary (Last 24 hours) at 01/30/2023 0941 Last data filed at 01/30/2023 0500 Gross per 24 hour  Intake 483 ml  Output 3225 ml  Net -2742 ml      01/30/2023    5:28 AM 01/29/2023    3:59 AM 01/28/2023    4:57 AM  Last 3 Weights  Weight (lbs) 362 lb 12.8 oz 364 lb 366 lb 6.5 oz  Weight (kg) 164.565 kg 165.109 kg 166.2 kg     Telemetry    NSR - Personally Reviewed  Physical Exam   GEN: No acute distress.  HEENT: Normocephalic, atraumatic, sclera non-icteric. Neck: No JVD or bruits. Cardiac: RRR 2/6 SEM no rubs or gallops.  Respiratory: Clear to auscultation bilaterally. Breathing is unlabored. GI: Soft, nontender, non-distended, BS +x 4. MS: no deformity. Extremities: No clubbing or cyanosis. Significant BLE edema. Distal pedal pulses are 2+ and equal bilaterally. Neuro:  AAOx3. Follows commands. Psych:  Responds to questions appropriately with a normal affect.  Labs    High Sensitivity Troponin:   Recent Labs  Lab 01/22/23 2054 01/22/23 2348  TROPONINIHS 8 8      Cardiac EnzymesNo results for input(s): "TROPONINI" in the last 168 hours. No results for input(s): "TROPIPOC" in the last 168 hours.   Chemistry Recent Labs  Lab 01/28/23 0224 01/29/23 0321 01/30/23 0236  NA 134* 135 136  K 3.9 3.7 3.4*  CL 98 100 100  CO2 27 26 27   GLUCOSE 112* 102* 101*  BUN 17 18 15   CREATININE 0.89 0.82 0.76  CALCIUM 8.8* 8.6* 8.6*  GFRNONAA >60 >60 >60  ANIONGAP 9 9 9      Hematology Recent Labs  Lab 01/24/23 0220 01/26/23 1146 01/26/23 1159 01/27/23 0238 01/28/23 0224  WBC 9.6  --   --  10.3 10.0  RBC 4.57  --   --  4.91 4.78  HGB 13.4   < > 15.3 14.2 13.7  HCT 41.6   < > 45.0 44.9 42.5  MCV 91.0  --   --  91.4 88.9  MCH 29.3  --   --  28.9 28.7  MCHC 32.2  --   --  31.6 32.2  RDW 15.4  --   --  15.5 15.3  PLT 242  --   --  268 265   < > = values in this interval not displayed.    BNPNo results for input(s): "BNP",  "PROBNP" in the last 168 hours.   DDimer No results for input(s): "DDIMER" in the last 168 hours.   Radiology    No results found.  Cardiac Studies    Cath 01/26/23   Prox LAD to Mid LAD lesion is 40% stenosed.   Prox Cx to Mid Cx lesion is 20% stenosed.   Ost 2nd Mrg lesion is 20% stenosed.   Mid RCA lesion is 40% stenosed.   Prox Cx lesion is 20% stenosed.   1.  Mild to moderate diffuse obstructive coronary artery disease without high-grade obstructions. 2.  Fick cardiac output of 9.7 L/min and Fick cardiac index of 3.5 L/min/m with the following hemodynamics:            Right atrial pressure mean of 9 mmHg            Right ventricular pressure 36/4 with an end-diastolic pressure of 16 mmHg            Mean wedge pressure of 14 mmHg            Pulmonary artery pressure of 34/15 with a mean of 24 mmHg   Recommendation: Continue evaluation for aortic valve intervention.   2D echo 12/22/22   1. Left ventricular ejection fraction, by estimation, is 60 to 65%. The  left ventricle has normal function. The left ventricle has no regional  wall motion abnormalities. There is mild left ventricular hypertrophy.  Left ventricular diastolic parameters  were normal.   2. Right ventricular systolic function is normal. The right ventricular  size is normal.   3. Left atrial size was moderately dilated.   4. The mitral valve is abnormal. No evidence of mitral valve  regurgitation. No evidence of mitral stenosis.   5. AS has progressed since TTE done 05/2022. The aortic valve was not  well visualized. There is moderate calcification of the aortic valve.  There is moderate thickening of the aortic valve. Aortic valve  regurgitation is not visualized. Severe aortic  valve stenosis.   6. Aortic dilatation noted. There is mild dilatation of the aortic root,  measuring 38 mm. There is mild dilatation of the ascending aorta,  measuring 40 mm.   7. The inferior vena cava is normal in size with  greater than 50%  respiratory variability, suggesting right atrial pressure of 3 mmHg.   Patient Profile     67 y.o. male with a history of non-obstructive CAD noted on cardiac catheterization in 2019, chronic diastolic CHF, paroxysmal atrial fibrillation on Eliquis, severe aortic stenosis noted on recent Echo in 12/2022, hypertension, hyperlipidemia, type 2 diabetes mellitus with peripheral neuropathy, COPD, obstructive sleep apnea on CPAP, seizure disorder, and prior tobacco use (quit about 6 years ago) who was admitted with acute hypoxic respiratory failure in setting of a/c diastolic HF, found to have severe AS as well.  Assessment & Plan    1. Acute hypoxic respiratory failure in setting of acute on chronic diastolic CHF, severe aortic stenosis, HTN with some hypotension as well - recent echo on 12/22/2022 showed 60-65% with normal wall motion and diastolic parameters but progression of known aortic stenosis to the severe range. Cardiomems on 01/16/2023 was mildly elevated suggestive of fluid accumulation. - diuresed well with IV Lasix (also takes metolazone once a week on Tuesdays), stopped 01/26/23 due to soft BP in anticipation of cath - oral torsemide resumed 01/28/23 -> still with impressive LE edema on exam but appears to be diuresing by I/O's, MD to review med regimen - on metolazone once weekly on Tuesdays, next dose planned 01/31/23 - continue Toprol, Farxiga, spironolctone - BP remains low normal but appears to be tolerating - cath findings with nonobstructive CAD, ongoing w/u for TAVR by structural heart team - unable to obtain adequate pre-TAVR planning CT on Friday, for re-try of cardiac CT today for TAVR planning (suboptimal images Friday and no CT reader over the weekend) - TAVR team following behind the scenes for timing of procedure   2. Chest Pain, non-obstructive CAD, HLD - chronic intermittent atypical CP for 6-7 months - negative troponins for similar discomfort on admission,  EKG reviewed with Dr. Anne Fu on Friday and felt reassuring, cath also with nonobstructive disease, not entirely typical for aortic stenosis - analgesia with Tylenol, oxycodone helped - not on ASA with concomitant Eliquis - continue statin   3. H/o PAF (on OAC PTA) with frequent PVCs on telemetry - Eliquis was on hold for cath, resumed 6/22 - continue Toprol and diltiazem - potassium as below - Mg has been normal   4. Hypokalemia - K 3.4, will give now then this afternoon - follow value in AM and likely need to add standing KCl in AM   Otherwise, per primary team: - COPD - Type 2 diabetes mellitus - Seizure disorder - Anxiety and depression - OSA on CPAP    For questions or updates, please contact [facility] Please consult [email or web address] for contact info under Cardiology/STEMI.  Signed, Laurann Montana, PA-C 01/30/2023, 9:41 AM    Personally seen and examined. Agree with APP above with the following comments:  Briefly 67 yo with COPD (Baseline O2 sate 89% at home, not on home O2)   with smoking cessation ~ 2018.  Morbid obesity.  Who is seen for multi-factorial SOB with Aortic stenosis  Patient notes that he has a good weekend but feel worse today.  He has chest pain once a week at baseline.  Now it is 2-3 X week. He is adherent to Cardiomems program and needed metolazone once to twice a week.  He walked the length of the floor with therapy today  Exam notable for  Distant  heart sounds; Morbid obesity Red blistering rash over right leg, bilateral pitting edema Decreased breath sounds bilaterally  Tele: NSR Personally reviewed relevant tests; TAVR with poor contrast opacification, appears to be tri-leaflet valve; Calcium score ~ 1450.  Would recommend  Will give metolazine 5 mg PO tomorrow. May need twice weekly dosing If hypotension will stop one of his AV nodal therapies. To get outpatient TAVR, he would need to be optimized enough to get through  rehab; this may be difficult.  Riley Lam, MD FASE [facility] Cardiologist [facility]  [address] [place], [address] [place], [place] [address] [phone]  1:55 PM

## 2023-01-30 NOTE — Progress Notes (Signed)
PROGRESS NOTE Lawrence [name]  ZOX:[id number] DOB: Mar 07, 1956 DOA: 01/22/2023 PCP: [name], PA-C   Brief Narrative/Hospital Course: 67 y.o. male with history of hypertension,CHF, PAF on Eliquis, aortic stenosis, CAD, CVA, diabetes mellitus type 2, COPD, AAA, prior history of tobacco abuse, morbid obesity, and OSA on CPAP presented with complaints of shortness of breath, intermittent left sided chest pain over the past 2 weeks. Patient being evaluated in the outpatient setting with tentatively planned right/left heart cath June 25. Hospitalist called for admission - cardiology called in consult.  Subjective: Patient mentioned that he was feeling much better.  Denies any chest pain shortness of breath.  No nausea or vomiting.  Assessment and Plan:   Acute respiratory failure with hypoxia and hypercapnia/dyspnea on exertion Exertional chest pain, ACS ruled out. -At baseline on room air, continue to wean oxygen down to 2 L today -Symptoms secondary to acute diastolic CHF as well as severe aortic stenosis. -CT angiogram negative for PE, respiratory viral panel negative -Continue home inhalers -Cardiac cath negative for obstructive disease, chest pain thought to be noncardiac in nature. -Continue PPI  Acute on chronic Diastolic congestive heart failure Severe aortic stenosis -Echocardiogram from May showed EF to be 60 to 65%.  Likely has diastolic dysfunction. -Volume overloaded on admission, diuresing well, cardiology transition to p.o. torsemide, spironolactone and metolazone. -Strict I's and O's negative 12L, daily weights increasing - unclear accuracy -CT to evaluate for TAVR attempted 6/21 with poor imaging results - Repeat attempt on imaging planned today - Patient clinically improving but not near baseline, continues to be markedly symptomatic even with very short distances with ambulation.  At this time patient is unstable for discharge home, would likely discharge to SNF in  the next few days pending bed availability/insurance approval which could take upwards of 5-7 business days. Given patient's likely prolonged hospitalization waiting for placement it is unclear when he would be able to follow-up outpatient for discussion with surgery to discuss TAVR, as such we would appreciate surgical team evaluation while here if at all possible.  Hypokalemia: Replete when appropriate, currently within normal limits.   COPD Continue home medications  Type 2 diabetes mellitus well-controlled with long-term use of insulin: HbA1c 6.4.  Continue SSI, Farxiga, glargine.  Monitor CBGs.   Paroxysmal atrial fibrillation  Rate controlled on Cardizem. Eliquis resumed per discussion with cardiology   Essential hypertension: Soft blood pressures noted while on multiple antihypertensives including diltiazem, metoprolol, Minipress.  Also on diuretics including furosemide and spironolactone. Defer to cardiology.   Anxiety and depression Continue as BuSpar, Abilify, venlafaxine   Seizure disorder Continue home Keppra and lacosamide  Obstructive sleep apnea Continue CPAP  Morbid obesity Estimated body mass index is 46.58 kg/m as calculated from the following:   Height as of this encounter: 6\' 2"  (1.88 m).   Weight as of this encounter: 164.6 kg.  DVT prophylaxis SCDs Start: 01/23/23 0527 apixaban (ELIQUIS) tablet 5 mg  Code Status:   Code Status: DNR Family Communication: Discussed with patient.   Dispo: Likely SNF given progressive weakness/exercise intolerance given above  Objective:  Vitals:   01/29/23 1108 01/29/23 1640 01/29/23 2011 01/30/23 0528  BP: (!) 106/59 (!) 106/47 116/66 105/69  Pulse:  66 70 68  Resp:  20 18 18   Temp:   98 F (36.7 C) 97.9 F (36.6 C)  TempSrc:   Oral Oral  SpO2:  95% 97% 96%  Weight:    (!) 164.6 kg  Height:  Weight change: -0.544 kg  Physical Examination:  General appearance: Resting comfortably, somewhat lethargic  but easily arousable, in no distress Resp: Clear to auscultation bilaterally.  Normal effort Cardio: Systolic murmur left sternal border, without rubs/gallops GI: Abdomen is soft.  Nontender nondistended.  Bowel sounds are present normal.  No masses organomegaly Extremities:1+ pitting edema Neurologic: Alert and oriented x3.  No focal neurological deficits.   Medications:  Scheduled Meds:  allopurinol  200 mg Oral BID   apixaban  5 mg Oral BID   ARIPiprazole  5 mg Oral QHS   atorvastatin  40 mg Oral Daily   busPIRone  10 mg Oral TID   dapagliflozin propanediol  10 mg Oral Q breakfast   diclofenac Sodium  4 g Topical QID   diltiazem  120 mg Oral Daily   fenofibrate  160 mg Oral Daily   fluticasone furoate-vilanterol  1 puff Inhalation Daily   And   umeclidinium bromide  1 puff Inhalation Daily   insulin aspart  0-15 Units Subcutaneous TID WC   insulin aspart  0-5 Units Subcutaneous QHS   insulin glargine-yfgn  30 Units Subcutaneous Daily   lacosamide  150 mg Oral BID   levETIRAcetam  500 mg Oral BID   metolazone  2.5 mg Oral Q Tue   metoprolol succinate  50 mg Oral Daily   mirtazapine  15 mg Oral QHS   pantoprazole  40 mg Oral BID   prazosin  2 mg Oral QHS   pregabalin  150 mg Oral TID   sodium chloride flush  3 mL Intravenous Q12H   sodium chloride flush  3 mL Intravenous Q12H   spironolactone  12.5 mg Oral Daily   torsemide  20 mg Oral Daily   traZODone  200 mg Oral QHS   venlafaxine XR  225 mg Oral Q breakfast  Continuous Infusions:     Diet Order             Diet heart healthy/carb modified Room service appropriate? Yes; Fluid consistency: Thin  Diet effective now                  Intake/Output Summary (Last 24 hours) at 01/30/2023 0734 Last data filed at 01/30/2023 0500 Gross per 24 hour  Intake 663 ml  Output 3225 ml  Net -2562 ml    Net IO Since Admission: -15,936 mL [01/30/23 0734]  Wt Readings from Last 3 Encounters:  01/30/23 (!) 164.6 kg   01/19/23 (!) 163.3 kg  01/12/23 (!) 162.8 kg     Data Reviewed: I have personally reviewed following labs and reports of imaging studies  CBC: Recent Labs  Lab 01/24/23 0220 01/26/23 1146 01/26/23 1156 01/26/23 1159 01/27/23 0238 01/28/23 0224  WBC 9.6  --   --   --  10.3 10.0  HGB 13.4 14.6 14.6 15.3 14.2 13.7  HCT 41.6 43.0 43.0 45.0 44.9 42.5  MCV 91.0  --   --   --  91.4 88.9  PLT 242  --   --   --  268 265    Basic Metabolic Panel: Recent Labs  Lab 01/25/23 0023 01/26/23 0204 01/26/23 1146 01/26/23 1159 01/27/23 0238 01/28/23 0224 01/29/23 0321 01/30/23 0236  NA 139 137   < > 141 137 134* 135 136  K 3.7 3.1*   < > 3.7 4.0 3.9 3.7 3.4*  CL 97* 97*  --   --  99 98 100 100  CO2 32 30  --   --  30 27 26 27   GLUCOSE 125* 149*  --   --  112* 112* 102* 101*  BUN 19 22  --   --  16 17 18 15   CREATININE 0.85 0.93  --   --  0.84 0.89 0.82 0.76  CALCIUM 9.0 8.8*  --   --  8.9 8.8* 8.6* 8.6*  MG 2.1  --   --   --   --   --   --   --    < > = values in this interval not displayed.    GFR: Estimated Creatinine Clearance: 150 mL/min (by C-G formula based on SCr of 0.76 mg/dL).  Liver Function Tests: No results for input(s): "AST", "ALT", "ALKPHOS", "BILITOT", "PROT", "ALBUMIN" in the last 168 hours.  Recent Labs  Lab 01/28/23 2155 01/29/23 0751 01/29/23 1201 01/29/23 1639 01/29/23 2116  GLUCAP 129* 164* 113* 126* 162*    Recent Results (from the past 240 hour(s))  Respiratory (~20 pathogens) panel by PCR     Status: None   Collection Time: 01/23/23  8:35 AM   Specimen: Nasopharyngeal Swab; Respiratory  Result Value Ref Range Status   Adenovirus NOT DETECTED NOT DETECTED Final   Coronavirus 229E NOT DETECTED NOT DETECTED Final    Comment: (NOTE) The Coronavirus on the Respiratory Panel, DOES NOT test for the novel  Coronavirus (2019 nCoV)    Coronavirus HKU1 NOT DETECTED NOT DETECTED Final   Coronavirus NL63 NOT DETECTED NOT DETECTED Final    Coronavirus OC43 NOT DETECTED NOT DETECTED Final   Metapneumovirus NOT DETECTED NOT DETECTED Final   Rhinovirus / Enterovirus NOT DETECTED NOT DETECTED Final   Influenza A NOT DETECTED NOT DETECTED Final   Influenza B NOT DETECTED NOT DETECTED Final   Parainfluenza Virus 1 NOT DETECTED NOT DETECTED Final   Parainfluenza Virus 2 NOT DETECTED NOT DETECTED Final   Parainfluenza Virus 3 NOT DETECTED NOT DETECTED Final   Parainfluenza Virus 4 NOT DETECTED NOT DETECTED Final   Respiratory Syncytial Virus NOT DETECTED NOT DETECTED Final   Bordetella pertussis NOT DETECTED NOT DETECTED Final   Bordetella Parapertussis NOT DETECTED NOT DETECTED Final   Chlamydophila pneumoniae NOT DETECTED NOT DETECTED Final   Mycoplasma pneumoniae NOT DETECTED NOT DETECTED Final    Comment: Performed at [facility], [address], [place], [place] [address]  SARS Coronavirus 2 by RT PCR (hospital order, performed in [facility] hospital lab) *cepheid single result test* Anterior Nasal Swab     Status: None   Collection Time: 01/23/23  8:35 AM   Specimen: Anterior Nasal Swab  Result Value Ref Range Status   SARS Coronavirus 2 by RT PCR NEGATIVE NEGATIVE Final    Comment: Performed at [facility], [address], [place], [place] [address]    Antimicrobials: Anti-infectives (From admission, onward)    Start     Dose/Rate Route Frequency Ordered Stop   01/22/23 0000  cephALEXin (KEFLEX) 500 MG capsule        500 mg Oral 4 times daily 01/22/23 2347        Culture/Microbiology    Component Value Date/Time   SDES URINE, CLEAN CATCH 08/07/2021 2100   SPECREQUEST NONE 08/07/2021 2100   CULT (A) 08/07/2021 2100    <10,000 COLONIES/mL INSIGNIFICANT GROWTH Performed at [facility], [address], [place], [place] [address]    REPTSTATUS 08/09/2021 FINAL 08/07/2021 2100   Radiology Studies: No results found.   LOS: 7 days  Azucena Fallen, DO [facility]  01/30/2023,  7:34 AM

## 2023-01-30 NOTE — Progress Notes (Signed)
Mobility Specialist Progress Note:   01/30/23 1415  Mobility  Activity Transferred from chair to bed  Level of Assistance Minimal assist, patient does 75% or more (+2)  Assistive Device Front wheel walker  Distance Ambulated (ft) 6 ft  Activity Response Tolerated well  Mobility Referral Yes  $Mobility charge 1 Mobility  Mobility Specialist Start Time (ACUTE ONLY) 1400  Mobility Specialist Stop Time (ACUTE ONLY) 1407  Mobility Specialist Time Calculation (min) (ACUTE ONLY) 7 min    Pt received in chair, requesting to return to bed. Asymptomatic throughout. Pt left in bed with call bell. RN and NT present.  D'Vante Earlene Plater Mobility Specialist Please contact via Special educational needs teacher or Rehab office at [phone]

## 2023-01-31 DIAGNOSIS — J9601 Acute respiratory failure with hypoxia: Secondary | ICD-10-CM | POA: Diagnosis not present

## 2023-01-31 LAB — BASIC METABOLIC PANEL
Anion gap: 8 (ref 5–15)
BUN: 16 mg/dL (ref 8–23)
CO2: 27 mmol/L (ref 22–32)
Calcium: 8.4 mg/dL — ABNORMAL LOW (ref 8.9–10.3)
Chloride: 103 mmol/L (ref 98–111)
Creatinine, Ser: 0.86 mg/dL (ref 0.61–1.24)
GFR, Estimated: >60 mL/min (ref >60)
Glucose, Bld: 121 mg/dL — ABNORMAL HIGH (ref 70–99)
Potassium: 3.6 mmol/L (ref 3.5–5.1)
Sodium: 138 mmol/L (ref 135–145)

## 2023-01-31 LAB — GLUCOSE, CAPILLARY
Glucose-Capillary: 156 mg/dL — ABNORMAL HIGH (ref 70–99)
Glucose-Capillary: 136 mg/dL — ABNORMAL HIGH (ref 70–99)
Glucose-Capillary: 141 mg/dL — ABNORMAL HIGH (ref 70–99)
Glucose-Capillary: 130 mg/dL — ABNORMAL HIGH (ref 70–99)

## 2023-01-31 LAB — CBC
HCT: 39.6 % (ref 39.0–52.0)
Hemoglobin: 12.6 g/dL — ABNORMAL LOW (ref 13.0–17.0)
MCH: 29.1 pg (ref 26.0–34.0)
MCHC: 31.8 g/dL (ref 30.0–36.0)
MCV: 91.5 fL (ref 80.0–100.0)
Platelets: 252 10*3/uL (ref 150–400)
RBC: 4.33 MIL/uL (ref 4.22–5.81)
RDW: 15.4 % (ref 11.5–15.5)
WBC: 10.1 10*3/uL (ref 4.0–10.5)
nRBC: 0 % (ref 0.0–0.2)

## 2023-01-31 MED ORDER — LIDOCAINE 5 % EX PTCH
1.0000 | MEDICATED_PATCH | CUTANEOUS | Status: DC
  Administered 2023-01-31 – 2023-02-07 (×7): 1 via TRANSDERMAL
  Filled 2023-01-31 (×8): qty 1

## 2023-01-31 MED ORDER — POTASSIUM CHLORIDE CRYS ER 20 MEQ PO TBCR
20.0000 meq | EXTENDED_RELEASE_TABLET | Freq: Every day | ORAL | Status: DC
  Administered 2023-01-31 – 2023-02-01 (×2): 20 meq via ORAL
  Filled 2023-01-31 (×2): qty 1

## 2023-01-31 NOTE — Progress Notes (Addendum)
Mobility Specialist Progress Note:   01/31/23 1213  Mobility  Activity Ambulated with assistance in room  Level of Assistance Minimal assist, patient does 75% or more  Assistive Device Four wheel walker  Distance Ambulated (ft) 30 ft  Activity Response Tolerated well  Mobility Referral Yes  $Mobility charge 1 Mobility  Mobility Specialist Start Time (ACUTE ONLY) 1155  Mobility Specialist Stop Time (ACUTE ONLY) 1210  Mobility Specialist Time Calculation (min) (ACUTE ONLY) 15 min   Pt received in bed, agreeable to mobility. C/o R knee pain before ambulation. MinA bed mobility and to stand. VSS on 2 L. Pt returned to bed with call bell in hand with MD and wife in room.   Leory Plowman  Mobility Specialist Please contact via [employer] office at [phone]

## 2023-01-31 NOTE — Progress Notes (Addendum)
Patient Name: Lawrence [name] Date of Encounter: 01/31/2023 [facility] Cardiologist: Armanda Magic, MD   Interval Summary  .    67 y.o. male with a history of non-obstructive CAD noted on cardiac catheterization in 2019, chronic diastolic CHF, paroxysmal atrial fibrillation on Eliquis, severe aortic stenosis noted on recent Echo in 12/2022, hypertension, hyperlipidemia, type 2 diabetes mellitus with peripheral neuropathy, COPD, obstructive sleep apnea on CPAP, seizure disorder, and prior tobacco use (quit about 6 years ago) who was admitted 01/22/23 with acute hypoxic respiratory failure in setting of a/c diastolic HF, found to have severe AS as well.   Today has chest pain Lt ant -2/10  rest and it usually goes away , he is having leg pain as well.   Vital Signs .    Vitals:   01/30/23 2032 01/30/23 2142 01/31/23 0437 01/31/23 0808  BP: 120/79 110/67 (!) 102/53 112/67  Pulse: 69  (!) 45   Resp: 18  18 16   Temp: 97.7 F (36.5 C)  97.6 F (36.4 C)   TempSrc: Oral  Oral   SpO2: 92%  93%   Weight:   (!) 166.2 kg   Height:        Intake/Output Summary (Last 24 hours) at 01/31/2023 0908 Last data filed at 01/30/2023 2151 Gross per 24 hour  Intake 15 ml  Output 2300 ml  Net -2285 ml      01/31/2023    4:37 AM 01/30/2023    5:28 AM 01/29/2023    3:59 AM  Last 3 Weights  Weight (lbs) 366 lb 6.5 oz 362 lb 12.8 oz 364 lb  Weight (kg) 166.2 kg 164.565 kg 165.109 kg      Telemetry/ECG    SR with freq PVCs - Personally Reviewed   Cath 01/26/23   Prox LAD to Mid LAD lesion is 40% stenosed.   Prox Cx to Mid Cx lesion is 20% stenosed.   Ost 2nd Mrg lesion is 20% stenosed.   Mid RCA lesion is 40% stenosed.   Prox Cx lesion is 20% stenosed.   1.  Mild to moderate diffuse obstructive coronary artery disease without high-grade obstructions. 2.  Fick cardiac output of 9.7 L/min and Fick cardiac index of 3.5 L/min/m with the following hemodynamics:            Right atrial  pressure mean of 9 mmHg            Right ventricular pressure 36/4 with an end-diastolic pressure of 16 mmHg            Mean wedge pressure of 14 mmHg            Pulmonary artery pressure of 34/15 with a mean of 24 mmHg   Recommendation: Continue evaluation for aortic valve intervention.   2D echo 12/22/22   1. Left ventricular ejection fraction, by estimation, is 60 to 65%. The  left ventricle has normal function. The left ventricle has no regional  wall motion abnormalities. There is mild left ventricular hypertrophy.  Left ventricular diastolic parameters  were normal.   2. Right ventricular systolic function is normal. The right ventricular  size is normal.   3. Left atrial size was moderately dilated.   4. The mitral valve is abnormal. No evidence of mitral valve  regurgitation. No evidence of mitral stenosis.   5. AS has progressed since TTE done 05/2022. The aortic valve was not  well visualized. There is moderate calcification of the aortic valve.  There is moderate thickening of the aortic valve. Aortic valve  regurgitation is not visualized. Severe aortic  valve stenosis.   6. Aortic dilatation noted. There is mild dilatation of the aortic root,  measuring 38 mm. There is mild dilatation of the ascending aorta,  measuring 40 mm.   7. The inferior vena cava is normal in size with greater than 50%  respiratory variability, suggesting right atrial pressure of 3 mmHg.     Physical Exam .   GEN: No acute distress.   Neck: No JVD Cardiac: RRR with premature beats, 2/6 murmur, no rubs, or gallops.  Respiratory: Clear to auscultation bilaterally. GI: Soft, nontender, non-distended  MS: No edema  Assessment & Plan .     Acute hypoxic respiratory failure in setting of acute on chronic diastolic CHF, severe aortic stenosis, HTN with some hypotension as well - recent echo on 12/22/2022 showed 60-65% with normal wall motion and diastolic parameters but progression of known aortic  stenosis to the severe range. Cardiomems on 01/16/2023 was mildly elevated suggestive of fluid accumulation. - diuresed well with IV Lasix (also takes metolazone once a week on Tuesdays), stopped 01/26/23 due to soft BP in anticipation of cath  (neg 12,197 since admit and wt only down 2 lbs.   now 365 lbs was 367.8 on the 18th   - oral torsemide 20 resumed 01/28/23 -> still with impressive LE edema on exam but appears to be diuresing by I/O's,metolazone added today for one time 5 mg may need twice per week - continue Toprol, Farxiga, spironolctone - BP remains low normal but appears to be tolerating - cath findings with nonobstructive CAD, ongoing w/u for TAVR by structural heart team - unable to obtain adequate pre-TAVR planning CT on Friday, for re-try of cardiac CT today for TAVR planning (suboptimal images Friday and no CT reader over the weekend) - TAVR team following behind the scenes for timing of procedure -is also on minipress 2 mg at hs and dilt 120 mg  -BP 120/79 to 102/53    Chest Pain, non-obstructive CAD, HLD - chronic intermittent atypical CP for 6-7 months - negative troponins for similar discomfort on admission, EKG reviewed with Dr. Anne Fu on Friday and felt reassuring, cath also with nonobstructive disease, not entirely typical for aortic stenosis  episodic chest pain. - analgesia with Tylenol, oxycodone helped - not on ASA with concomitant Eliquis - continue statin   H/o PAF (on OAC PTA) with frequent PVCs on telemetry - Eliquis was on hold for cath, resumed 6/22 - continue Toprol and diltiazem - potassium as below - Mg has been normal  Hypokalemia - K 3.6, up from 3.4 given supplemental K+ yesterday will give now then this afternoon - will add Kdur 20 meq daily   Otherwise, per primary team: - COPD - Type 2 diabetes mellitus - Seizure disorder - Anxiety and depression - OSA on CPAP --rash rt leg    For questions or updates, please contact [facility] Please consult [email or web address] for contact info under        Signed, Nada Boozer, NP   Personally seen and examined. Agree with APP above with the following comments: 67 yo M with multi-factorial SOB related to HFpEF, Morbid Obesity, OSA on CPAP, COPD and former smoking with Severe AS Tentatively planned for TAVR next Tuesday. Was able to get to the nurses station with three mobility specialists.   Notes no CP on exam.  Legs are less swollen, his  breathing and fatigue are the same and his leg pain is worse.  Exam notable for rare irregular betas and distant heart sounds.  The bulle on his right leg from yesterday has burst.  Improved leg swelling.  Morbid obesity.  Unable to assess JVD today.  Rest as above.  Tele: SR with PVCs  Would recommend  - will continue his metolazone q72 hrs and not make additional cardiac changes - he is planning to see surgery tomorrow if inpatient or Thursday if outpatient - if no improvements tomorrow; we will ask AHF team to see for optimization prior to procedure - goal would be outpatient TAVR - we will have to deal with his poor dentition and he may be at higher risk for prosthetic infection  Riley Lam, MD FASE [facility] Cardiologist [facility]  [address] [place], [address] [place], [place] [address] [phone]  12:37 PM

## 2023-01-31 NOTE — Progress Notes (Addendum)
PROGRESS NOTE Lawrence [name]  VQM:[id number] DOB: 05-27-1956 DOA: 01/22/2023 PCP: [name], PA-C   Brief Narrative/Hospital Course: 67 y.o. male with history of hypertension,CHF, PAF on Eliquis, aortic stenosis, CAD, CVA, diabetes mellitus type 2, COPD, AAA, prior history of tobacco abuse, morbid obesity, and OSA on CPAP presented with complaints of shortness of breath, intermittent left sided chest pain over the past 2 weeks. Patient being evaluated in the outpatient setting with tentatively planned right/left heart cath June 25. Hospitalist called for admission - cardiology called in consult.  Subjective: Patient mentioned that he was feeling much improved.  Denies any chest pain shortness of breath.  No nausea or vomiting.  He does note worsening leg pain, this is a chronic issue.  Assessment and Plan:   Acute respiratory failure with hypoxia and hypercapnia/dyspnea on exertion Exertional chest pain, ACS ruled out. -At baseline on room air, continue to wean oxygen - 2-3L [place] today -Symptoms secondary to acute diastolic CHF exacerbated by concurrent severe aortic stenosis. -CT angiogram negative for PE, respiratory viral panel negative -Continue home inhalers -Cardiac cath negative for obstructive disease, chest pain thought to be noncardiac in nature. -Continue PPI  Acute on chronic Diastolic congestive heart failure Severe aortic stenosis -Echocardiogram from May showed EF to be 60 to 65%.  Likely has diastolic dysfunction. -Volume overloaded on admission, diuresing well, cardiology transition to p.o. torsemide, spironolactone and metolazone. -Strict I's and O's negative 12L, daily weights increasing - unclear accuracy -CT to evaluate for TAVR attempted 6/21 with poor imaging results -repeat imaging 6/25 completed - Patient clinically improving but not near baseline, continues to be markedly symptomatic even with very short distances with ambulation. At this time patient is  unstable for discharge home with family given needs/fall risk, would likely discharge to SNF pending clinical improvement, in the mean time patient has been tentatively scheduled for TAVR 02/07/23 (in 7 days) - unclear if patient will be stable for DC home to return for procedure vs remaining here for procedure. -Surgery to meet with patient 02/01/23 inpatient to discuss plan moving forward (inpt vs outpt workup/surgery)  Hypokalemia: Replete when appropriate, currently within normal limits.   COPD Continue home medications: Breo, Incruse, nebs  Type 2 diabetes mellitus well-controlled with long-term use of insulin: HbA1c 6.4.  Continue SSI, Farxiga, glargine.  Monitor CBGs.   Paroxysmal atrial fibrillation  Rate controlled on Cardizem. Eliquis resumed per discussion with cardiology   Essential hypertension: Soft BP on current regimen: diltiazem, metoprolol, prazosin.  Also on diuretics including torsemide and spironolactone. Defer to cardiology for adjustments.   Anxiety and depression Continue as BuSpar, Abilify, venlafaxine   Seizure disorder Continue home Keppra and lacosamide  Obstructive sleep apnea Continue CPAP  Morbid obesity Estimated body mass index is 47.04 kg/m as calculated from the following:   Height as of this encounter: 6\' 2"  (1.88 m).   Weight as of this encounter: 166.2 kg.  DVT prophylaxis SCDs Start: 01/23/23 0527 apixaban (ELIQUIS) tablet 5 mg  Code Status:   Code Status: DNR Family Communication: Discussed with patient.   Dispo: Likely SNF given progressive weakness/exercise intolerance given above  Objective:  Vitals:   01/30/23 2142 01/31/23 0437 01/31/23 0808 01/31/23 1426  BP: 110/67 (!) 102/53 112/67 108/68  Pulse:  (!) 45  68  Resp:  18 16 15   Temp:  97.6 F (36.4 C)  97.8 F (36.6 C)  TempSrc:  Oral  Oral  SpO2:  93%  96%  Weight:  (!) 166.2  kg    Height:       Weight change: 1.635 kg  Physical Examination:  General appearance:  Resting comfortably, in no distress Resp: Clear to auscultation bilaterally.  Normal effort Cardio: Systolic murmur left sternal border, without rubs/gallops GI: Abdomen is soft.  Nontender nondistended.  Bowel sounds are present normal.  No masses organomegaly Extremities:1+ pitting edema BLE Neurologic: Alert and oriented x3.  No focal neurological deficits.   Medications:  Scheduled Meds:  allopurinol  200 mg Oral BID   apixaban  5 mg Oral BID   ARIPiprazole  5 mg Oral QHS   atorvastatin  40 mg Oral Daily   busPIRone  10 mg Oral TID   dapagliflozin propanediol  10 mg Oral Q breakfast   diclofenac Sodium  4 g Topical QID   diltiazem  120 mg Oral Daily   fenofibrate  160 mg Oral Daily   fluticasone furoate-vilanterol  1 puff Inhalation Daily   And   umeclidinium bromide  1 puff Inhalation Daily   insulin aspart  0-15 Units Subcutaneous TID WC   insulin aspart  0-5 Units Subcutaneous QHS   insulin glargine-yfgn  30 Units Subcutaneous Daily   lacosamide  150 mg Oral BID   levETIRAcetam  500 mg Oral BID   metoprolol succinate  50 mg Oral Daily   mirtazapine  15 mg Oral QHS   pantoprazole  40 mg Oral BID   potassium chloride  20 mEq Oral Daily   prazosin  2 mg Oral QHS   pregabalin  150 mg Oral TID   sodium chloride flush  3 mL Intravenous Q12H   sodium chloride flush  3 mL Intravenous Q12H   spironolactone  12.5 mg Oral Daily   torsemide  20 mg Oral Daily   traZODone  200 mg Oral QHS   venlafaxine XR  225 mg Oral Q breakfast  Continuous Infusions:     Diet Order             Diet heart healthy/carb modified Room service appropriate? Yes; Fluid consistency: Thin  Diet effective now                  Intake/Output Summary (Last 24 hours) at 01/31/2023 1458 Last data filed at 01/31/2023 1313 Gross per 24 hour  Intake 15 ml  Output 2850 ml  Net -2835 ml   Net IO Since Admission: -20,071 mL [01/31/23 1458]  Wt Readings from Last 3 Encounters:  01/31/23 (!) 166.2 kg   01/19/23 (!) 163.3 kg  01/12/23 (!) 162.8 kg     Data Reviewed: I have personally reviewed following labs and reports of imaging studies  CBC: Recent Labs  Lab 01/26/23 1156 01/26/23 1159 01/27/23 0238 01/28/23 0224 01/31/23 0147  WBC  --   --  10.3 10.0 10.1  HGB 14.6 15.3 14.2 13.7 12.6*  HCT 43.0 45.0 44.9 42.5 39.6  MCV  --   --  91.4 88.9 91.5  PLT  --   --  268 265 252   Basic Metabolic Panel: Recent Labs  Lab 01/25/23 0023 01/26/23 0204 01/27/23 0238 01/28/23 0224 01/29/23 0321 01/30/23 0236 01/31/23 0147  NA 139   < > 137 134* 135 136 138  K 3.7   < > 4.0 3.9 3.7 3.4* 3.6  CL 97*   < > 99 98 100 100 103  CO2 32   < > 30 27 26 27 27   GLUCOSE 125*   < > 112* 112* 102*  101* 121*  BUN 19   < > 16 17 18 15 16   CREATININE 0.85   < > 0.84 0.89 0.82 0.76 0.86  CALCIUM 9.0   < > 8.9 8.8* 8.6* 8.6* 8.4*  MG 2.1  --   --   --   --   --   --    < > = values in this interval not displayed.   GFR: Estimated Creatinine Clearance: 140.3 mL/min (by C-G formula based on SCr of 0.86 mg/dL).  Liver Function Tests: No results for input(s): "AST", "ALT", "ALKPHOS", "BILITOT", "PROT", "ALBUMIN" in the last 168 hours.  Recent Labs  Lab 01/30/23 0808 01/30/23 1159 01/30/23 1603 01/30/23 2057 01/31/23 0720  GLUCAP 182* 117* 124* 139* 156*   Recent Results (from the past 240 hour(s))  Respiratory (~20 pathogens) panel by PCR     Status: None   Collection Time: 01/23/23  8:35 AM   Specimen: Nasopharyngeal Swab; Respiratory  Result Value Ref Range Status   Adenovirus NOT DETECTED NOT DETECTED Final   Coronavirus 229E NOT DETECTED NOT DETECTED Final    Comment: (NOTE) The Coronavirus on the Respiratory Panel, DOES NOT test for the novel  Coronavirus (2019 nCoV)    Coronavirus HKU1 NOT DETECTED NOT DETECTED Final   Coronavirus NL63 NOT DETECTED NOT DETECTED Final   Coronavirus OC43 NOT DETECTED NOT DETECTED Final   Metapneumovirus NOT DETECTED NOT DETECTED Final    Rhinovirus / Enterovirus NOT DETECTED NOT DETECTED Final   Influenza A NOT DETECTED NOT DETECTED Final   Influenza B NOT DETECTED NOT DETECTED Final   Parainfluenza Virus 1 NOT DETECTED NOT DETECTED Final   Parainfluenza Virus 2 NOT DETECTED NOT DETECTED Final   Parainfluenza Virus 3 NOT DETECTED NOT DETECTED Final   Parainfluenza Virus 4 NOT DETECTED NOT DETECTED Final   Respiratory Syncytial Virus NOT DETECTED NOT DETECTED Final   Bordetella pertussis NOT DETECTED NOT DETECTED Final   Bordetella Parapertussis NOT DETECTED NOT DETECTED Final   Chlamydophila pneumoniae NOT DETECTED NOT DETECTED Final   Mycoplasma pneumoniae NOT DETECTED NOT DETECTED Final    Comment: Performed at [facility], [address] [place], [place] [address]  SARS Coronavirus 2 by RT PCR (hospital order, performed in [facility] hospital lab) *cepheid single result test* Anterior Nasal Swab     Status: None   Collection Time: 01/23/23  8:35 AM   Specimen: Anterior Nasal Swab  Result Value Ref Range Status   SARS Coronavirus 2 by RT PCR NEGATIVE NEGATIVE Final    Comment: Performed at [facility], [address], [place], [place] [address]    Antimicrobials: Anti-infectives (From admission, onward)    Start     Dose/Rate Route Frequency Ordered Stop   01/22/23 0000  cephALEXin (KEFLEX) 500 MG capsule        500 mg Oral 4 times daily 01/22/23 2347        Culture/Microbiology    Component Value Date/Time   SDES URINE, CLEAN CATCH 08/07/2021 2100   SPECREQUEST NONE 08/07/2021 2100   CULT (A) 08/07/2021 2100    <10,000 COLONIES/mL INSIGNIFICANT GROWTH Performed at [facility], [address], [place], [place] [address]    REPTSTATUS 08/09/2021 FINAL 08/07/2021 2100   Radiology Studies: CT CORONARY MORPH W/CTA COR W/SCORE W/CA W/CM &/OR WO/CM  Addendum Date: 01/30/2023   ADDENDUM REPORT: 01/30/2023 14:10 CLINICAL DATA:  3M with severe aortic stenosis being evaluated for a TAVR  procedure. EXAM:  Cardiac TAVR CT TECHNIQUE: The patient was scanned on a [employer]. A 120 kV retrospective scan was triggered in the descending thoracic aorta at 111 HU's. Gantry rotation speed was 250 msecs and collimation was .6 mm. No beta blockade or nitro were given. The 3D data set was reconstructed in 5% intervals of the R-R cycle. Systolic and diastolic phases were analyzed on a dedicated work station using MPR, MIP and VRT modes. The patient received 100 cc of contrast. FINDINGS: Aortic Root: Aortic valve: Trileaflet Aortic annulus: Diameter: 29mm x 27mm Perimeter: 92mm Area: 657 mm^2 Calcifications: No calcifications Coronary height: Min Left - 16mm, Min Right - 17mm Sinotubular height: Left cusp - 24mm; Right cusp - 27mm; Noncoronary cusp - 29mm LVOT (as measured 3 mm below the annulus): Diameter: 30mm x 27mm Area: 735 mm^2 Calcifications: No calcifications Aortic sinus width: Left cusp - 37mm; Right cusp - 37mm; Noncoronary cusp - 37mm Sinotubular junction width: 34mm x 33mm Optimum Fluoroscopic Angle for Delivery: RAO 5 CRA 10 IMPRESSION: 1. Poor contrast opacification, which affects accuracy of TAVR measurements 2.  Tricuspid aortic valve with severe calcifications 3. Aortic annulus measures 29mm x 27mm in diameter with perimeter 92mm and area 657 mm^2. No annular or LVOT calcifications. Annular measurements are suitable for placement of 29mm Sapien 3 valve 4. Sufficient coronary to annulus distance. 5. Optimum Fluoroscopic Angle for Delivery:  RAO 5 CRA 10 6. See prior study on 01/27/23 for aortic valve and coronary calcium scores Electronically Signed   By: Epifanio Lesches M.D.   On: 01/30/2023 14:10   Result Date: 01/30/2023 EXAM: OVER-READ INTERPRETATION  CT CHEST The following report is a limited chest CT over-read performed by radiologist Dr. Duanne Guess of [facility] on 01/30/2023. This over-read does not include interpretation of cardiac or coronary anatomy  or pathology. The coronary CTA interpretation by the cardiologist is attached. COMPARISON:  01/27/2023 FINDINGS: Stable heart size. No pericardial effusion. Image thoracic aorta is nonaneurysmal. Aortic and coronary artery atherosclerosis. Central pulmonary vasculature within normal limits. Mildly enlarged subcarinal and bilateral hilar lymph nodes, unchanged. Atelectatic changes within the dependent lung fields. Imaged lung fields are otherwise clear. No acute bony or chest wall abnormality. IMPRESSION: 1. No new or acute extracardiac findings. 2. Mildly enlarged subcarinal and bilateral hilar lymph nodes, unchanged. 3. Aortic and coronary artery atherosclerosis (ICD10-I70.0). Electronically Signed: By: Duanne Guess D.O. On: 01/30/2023 13:19     LOS: 8 days   Azucena Fallen, DO [facility]  01/31/2023, 2:58 PM

## 2023-01-31 NOTE — Progress Notes (Signed)
Physical Therapy Treatment Patient Details Name: Lawrence [name] MRN: [id number] DOB: 1956-06-29 Today's Date: 01/31/2023   History of Present Illness Pt is a 67 y/o male who presents 01/22/23 with SOB and intermittent L sided CP for ~2 weeks PTA. R/L heart cath scheduled for 6/20 while inpatient. S/P heart cath 6/20. PMH significant for HTN, CHF, PAF on Eliquis, aortic stenosis, CAD, CVA, DM II, COPD, AAA.    PT Comments    Pt agreeable to therapy today, reports walking to nursing station yesterday but only able to walk to door with mobility specialist today, and voices his frustration. Pt is able to get to EoB with supervision and use urinal prior to ambulation. Pt reports he is on RA at home and does want to have to use supplemental O2 at discharge. Able to ambulate on RA and maintain SpO2 88-91%O2, but pt reports dizziness after ambulating to doorway. Vitals stable with return to bed. Pt reports frustration with limited distance ambulation. Discussed that pt is awaiting surgery and that until then just getting up to decrease joint stiffness and strength loss is goal and pushing through walking while dizzy is probably not in his best interest. Pt can work on progressing mobility after surgery. Pt voices understanding     Recommendations for follow up therapy are one component of a multi-disciplinary discharge planning process, led by the attending physician.  Recommendations may be updated based on patient status, additional functional criteria and insurance authorization.     Assistance Recommended at Discharge Intermittent Supervision/Assistance  Patient can return home with the following A little help with walking and/or transfers;A little help with bathing/dressing/bathroom;Assistance with cooking/housework;Assist for transportation;Help with stairs or ramp for entrance   Equipment Recommendations  None recommended by PT       Precautions / Restrictions Precautions Precautions:  Fall Precaution Comments: monitor HR and sats, reports of dizziness Restrictions Weight Bearing Restrictions: No     Mobility  Bed Mobility Overal bed mobility: Needs Assistance Bed Mobility: Supine to Sit     Supine to sit: Supervision     General bed mobility comments: supervision for safety with coming to EoB with HoB elevated and use of bedrails, minA for management of LE back into bed, pt able to use bed controls to scoot up in bed    Transfers Overall transfer level: Needs assistance Equipment used: Rolling walker (2 wheels) Transfers: Sit to/from Stand Sit to Stand: From elevated surface, Min assist           General transfer comment: using momentum and higher surface to stand, min A for steadying while patient rotates posteriorly to come to fully upright    Ambulation/Gait Ambulation/Gait assistance: Min guard Gait Distance (Feet): 20 Feet Assistive device: Rollator (4 wheels), Rolling walker (2 wheels) Gait Pattern/deviations: Step-to pattern, Step-through pattern, Decreased stride length, Antalgic, Trunk flexed, Wide base of support Gait velocity: Decreased Gait velocity interpretation: <1.31 ft/sec, indicative of household ambulator   General Gait Details: Heavy UE support on RW with increase pain in R LE weightbearing, severe R knee genuvarus, dizziness with ambulation to doorway so returned to bed       Balance Overall balance assessment: Needs assistance Sitting-balance support: No upper extremity supported, Feet supported Sitting balance-Leahy Scale: Good Sitting balance - Comments: leaning back to lift foot for donning socks at EOB without LOB   Standing balance support: Bilateral upper extremity supported Standing balance-Leahy Scale: Poor Standing balance comment: reliant on RW support  Cognition Arousal/Alertness: Awake/alert Behavior During Therapy: WFL for tasks assessed/performed, Flat affect Overall  Cognitive Status: Within Functional Limits for tasks assessed                                 General Comments: impaired cognition in last session likely due to morphine, cognition WFL           General Comments General comments (skin integrity, edema, etc.): pt with reports of dizziness with ambulation to doorway, pt reports started at about 8 feet ambulation, VSS with return to bed, however pt takes ~1 min for dizziness to clear after sitting EoB. Pt reporting dissappointement with not going as far as he has in the past. Discussed not pushing himself too hard while awaiting surgery, will work on progressing of mobility post surgery, now mobility to keep joints from stiffening and from loosing strength, ambulates on RA with SpO2 88-91%O2 which is his baseline      Pertinent Vitals/Pain Pain Assessment Pain Assessment: 0-10 Pain Score: 5  Pain Location: feet Pain Descriptors / Indicators: Aching, Discomfort Pain Intervention(s): Limited activity within patient's tolerance, Monitored during session, Repositioned     PT Goals (current goals can now be found in the care plan section) Acute Rehab PT Goals Patient Stated Goal: Back to baseline of function PT Goal Formulation: With patient Time For Goal Achievement: 02/08/23 Potential to Achieve Goals: Good Progress towards PT goals: Not progressing toward goals - comment    Frequency    Min 1X/week      PT Plan Current plan remains appropriate       AM-PAC PT "6 Clicks" Mobility   Outcome Measure  Help needed turning from your back to your side while in a flat bed without using bedrails?: A Little Help needed moving from lying on your back to sitting on the side of a flat bed without using bedrails?: A Little Help needed moving to and from a bed to a chair (including a wheelchair)?: A Little Help needed standing up from a chair using your arms (e.g., wheelchair or bedside chair)?: Total Help needed to walk in  hospital room?: A Little Help needed climbing 3-5 steps with a railing? : Total 6 Click Score: 14    End of Session Equipment Utilized During Treatment: Gait belt;Oxygen Activity Tolerance: Patient tolerated treatment well;Treatment limited secondary to medical complications (Comment) (dizziness unknown origin) Patient left: in chair;with call bell/phone within reach;with family/visitor present Nurse Communication: Mobility status;Other (comment) (need for O2) PT Visit Diagnosis: Unsteadiness on feet (R26.81);Muscle weakness (generalized) (M62.81);Difficulty in walking, not elsewhere classified (R26.2)     Time: 9147-8295 PT Time Calculation (min) (ACUTE ONLY): 29 min  Charges:  $Gait Training: 8-22 mins $Therapeutic Activity: 8-22 mins                     Lawrence [name] B. Beverely Risen PT, DPT Acute Rehabilitation Services Please use secure chat or  Call Office [phone]    Lawrence [name] [facility] 01/31/2023, 4:34 PM

## 2023-01-31 NOTE — Progress Notes (Signed)
Resmed @bedside   01/30/23 2330  BiPAP/CPAP/SIPAP  Reason BIPAP/CPAP not in use Non-compliant

## 2023-02-01 DIAGNOSIS — J9601 Acute respiratory failure with hypoxia: Secondary | ICD-10-CM | POA: Diagnosis not present

## 2023-02-01 DIAGNOSIS — I480 Paroxysmal atrial fibrillation: Secondary | ICD-10-CM | POA: Diagnosis not present

## 2023-02-01 DIAGNOSIS — I350 Nonrheumatic aortic (valve) stenosis: Secondary | ICD-10-CM | POA: Diagnosis not present

## 2023-02-01 LAB — GLUCOSE, CAPILLARY
Glucose-Capillary: 189 mg/dL — ABNORMAL HIGH (ref 70–99)
Glucose-Capillary: 157 mg/dL — ABNORMAL HIGH (ref 70–99)
Glucose-Capillary: 129 mg/dL — ABNORMAL HIGH (ref 70–99)
Glucose-Capillary: 172 mg/dL — ABNORMAL HIGH (ref 70–99)

## 2023-02-01 LAB — BASIC METABOLIC PANEL
Anion gap: 12 (ref 5–15)
BUN: 20 mg/dL (ref 8–23)
CO2: 26 mmol/L (ref 22–32)
Calcium: 8.9 mg/dL (ref 8.9–10.3)
Chloride: 96 mmol/L — ABNORMAL LOW (ref 98–111)
Creatinine, Ser: 0.97 mg/dL (ref 0.61–1.24)
GFR, Estimated: >60 mL/min (ref >60)
Glucose, Bld: 175 mg/dL — ABNORMAL HIGH (ref 70–99)
Potassium: 3.5 mmol/L (ref 3.5–5.1)
Sodium: 134 mmol/L — ABNORMAL LOW (ref 135–145)

## 2023-02-01 LAB — MAGNESIUM: Magnesium: 1.9 mg/dL (ref 1.7–2.4)

## 2023-02-01 MED ORDER — IPRATROPIUM-ALBUTEROL 0.5-2.5 (3) MG/3ML IN SOLN: 3.0000 mL | RESPIRATORY_TRACT | Status: DC | PRN

## 2023-02-01 MED ORDER — METOLAZONE 5 MG PO TABS: 2.5000 mg | ORAL_TABLET | Freq: Once | ORAL | Status: DC

## 2023-02-01 MED ORDER — SENNOSIDES-DOCUSATE SODIUM 8.6-50 MG PO TABS: 1.0000 | ORAL_TABLET | Freq: Every evening | ORAL | Status: DC | PRN

## 2023-02-01 MED ORDER — HYDRALAZINE HCL 20 MG/ML IJ SOLN: 10.0000 mg | INTRAMUSCULAR | Status: DC | PRN

## 2023-02-01 MED ORDER — POTASSIUM CHLORIDE CRYS ER 20 MEQ PO TBCR
20.0000 meq | EXTENDED_RELEASE_TABLET | Freq: Once | ORAL | Status: AC
  Administered 2023-02-01: 20 meq via ORAL
  Filled 2023-02-01: qty 1

## 2023-02-01 MED ORDER — GUAIFENESIN 100 MG/5ML PO LIQD: 5.0000 mL | ORAL | Status: DC | PRN

## 2023-02-01 NOTE — Consult Note (Addendum)
Dental Consult   Lawrence [name] is an 67 y.o. male history of CAD , chronic diastolic CHF, paroxysmal atrial fibrillation on Eliquis, severe aortic stenosis  hypertension, hyperlipidemia, type 2 diabetes mellitus with peripheral neuropathy, COPD, obstructive sleep apnea on CPAP, seizure disorder, and prior tobacco use (quit about 6 years ago) who was admitted 01/22/23 with acute hypoxic respiratory failure in setting of a/c diastolic HF, found to have severe AS as well. Planned for TAVR and in need of dental consult prior to surgery.  CC:Bad teeth. No pain currently.   HPI: Hasn't seen a dentist in years.  Past Medical History:  Diagnosis Date   Acquired dilation of ascending aorta and aortic root (HCC)    40mm by echo 01/2021   Adenomatous colon polyp 2007   Anemia    Anxiety    Asthma    BPH without obstruction/lower urinary tract symptoms 02/22/2017   Chronic diastolic (congestive) heart failure (HCC)    Chronic venous stasis 03/07/2019   COPD (chronic obstructive pulmonary disease) (HCC)    Coronary artery calcification seen on CAT scan    Depression    Diabetic neuropathy (HCC) 09/11/2019   History of colon polyps 08/24/2018   Hypertension    Morbid obesity (HCC)    OSA (obstructive sleep apnea)    Pain due to onychomycosis of toenails of both feet 09/11/2019   Peripheral neuropathy 02/22/2017   Primary osteoarthritis, left shoulder 03/05/2017   PTSD (post-traumatic stress disorder)    Pure hypercholesterolemia    QT prolongation 03/07/2019   Seizures (HCC)    Severe aortic stenosis    Sinus tachycardia 03/07/2019   Sleep apnea    CPAP   Type 2 diabetes mellitus with vascular disease (HCC) 09/11/2019    Past Surgical History:  Procedure Laterality Date   ENDOVENOUS ABLATION SAPHENOUS VEIN W/ LASER Right 08/20/2020   endovenous laser ablation right greater saphenous vein by Cari Caraway MD    ENDOVENOUS ABLATION SAPHENOUS VEIN W/ LASER Left 11/16/2022   endovenous  laser ablation left greater saphenous vein by Cari Caraway MD   JOINT REPLACEMENT     left knee replacement x 2   KNEE ARTHROSCOPY Bilateral    LEFT HEART CATH AND CORONARY ANGIOGRAPHY N/A 01/17/2018   Procedure: LEFT HEART CATH AND CORONARY ANGIOGRAPHY;  Surgeon: Marykay Lex, MD;  Location: [facility] INVASIVE CV LAB;  Service: Cardiovascular;  Laterality: N/A;   PRESSURE SENSOR/CARDIOMEMS N/A 08/26/2021   Procedure: PRESSURE SENSOR/CARDIOMEMS;  Surgeon: Laurey Morale, MD;  Location: [facility] INVASIVE CV LAB;  Service: Cardiovascular;  Laterality: N/A;   RIGHT HEART CATH N/A 08/26/2021   Procedure: RIGHT HEART CATH;  Surgeon: Laurey Morale, MD;  Location: [facility] INVASIVE CV LAB;  Service: Cardiovascular;  Laterality: N/A;   RIGHT HEART CATH AND CORONARY ANGIOGRAPHY N/A 01/26/2023   Procedure: RIGHT HEART CATH AND CORONARY ANGIOGRAPHY;  Surgeon: Orbie Pyo, MD;  Location: [facility] INVASIVE CV LAB;  Service: Cardiovascular;  Laterality: N/A;   TEE WITHOUT CARDIOVERSION N/A 08/26/2021   Procedure: TRANSESOPHAGEAL ECHOCARDIOGRAM (TEE);  Surgeon: Laurey Morale, MD;  Location: [facility] ENDOSCOPY;  Service: Cardiovascular;  Laterality: N/A;   UMBILICAL HERNIA REPAIR      Family History  Problem Relation Age of Onset   CAD Maternal Grandfather    Diabetes Other    Diabetes Mellitus II Neg Hx    Colon cancer Neg Hx    Esophageal cancer Neg Hx    Inflammatory bowel disease Neg Hx  Liver disease Neg Hx    Pancreatic cancer Neg Hx    Rectal cancer Neg Hx    Stomach cancer Neg Hx    Sleep apnea Neg Hx     Social History:  reports that he quit smoking about 6 years ago. His smoking use included cigarettes. He has a 44.00 pack-year smoking history. He has been exposed to tobacco smoke. He has never used smokeless tobacco. He reports current alcohol use. He reports that he does not use drugs.  Allergies:  Allergies  Allergen Reactions   Vancomycin Other (See Comments)    "Red Man Syndrome" 02/02/17:  possible cause for rash under both arms   Niacin And Related Other (See Comments)    Red man syndrome   Tubersol [Tuberculin, Ppd] Other (See Comments)    Reaction unknown   Doxycycline Rash and Other (See Comments)    Medications: I have reviewed the patient's current medications.  Results for orders placed or performed during the hospital encounter of 01/22/23 (from the past 48 hour(s))  Glucose, capillary     Status: Abnormal   Collection Time: 01/30/23  8:08 AM  Result Value Ref Range   Glucose-Capillary 182 (H) 70 - 99 mg/dL    Comment: Glucose reference range applies only to samples taken after fasting for at least 8 hours.  Glucose, capillary     Status: Abnormal   Collection Time: 01/30/23 11:59 AM  Result Value Ref Range   Glucose-Capillary 117 (H) 70 - 99 mg/dL    Comment: Glucose reference range applies only to samples taken after fasting for at least 8 hours.  Glucose, capillary     Status: Abnormal   Collection Time: 01/30/23  4:03 PM  Result Value Ref Range   Glucose-Capillary 124 (H) 70 - 99 mg/dL    Comment: Glucose reference range applies only to samples taken after fasting for at least 8 hours.  Glucose, capillary     Status: Abnormal   Collection Time: 01/30/23  8:57 PM  Result Value Ref Range   Glucose-Capillary 139 (H) 70 - 99 mg/dL    Comment: Glucose reference range applies only to samples taken after fasting for at least 8 hours.  Basic metabolic panel     Status: Abnormal   Collection Time: 01/31/23  1:47 AM  Result Value Ref Range   Sodium 138 135 - 145 mmol/L   Potassium 3.6 3.5 - 5.1 mmol/L   Chloride 103 98 - 111 mmol/L   CO2 27 22 - 32 mmol/L   Glucose, Bld 121 (H) 70 - 99 mg/dL    Comment: Glucose reference range applies only to samples taken after fasting for at least 8 hours.   BUN 16 8 - 23 mg/dL   Creatinine, Ser 4.09 0.61 - 1.24 mg/dL   Calcium 8.4 (L) 8.9 - 10.3 mg/dL   GFR, Estimated >81 >19 mL/min    Comment: (NOTE) Calculated using  the CKD-EPI Creatinine Equation (2021)    Anion gap 8 5 - 15    Comment: Performed at [facility], [address] [place], [place] [address]  CBC     Status: Abnormal   Collection Time: 01/31/23  1:47 AM  Result Value Ref Range   WBC 10.1 4.0 - 10.5 K/uL   RBC 4.33 4.22 - 5.81 MIL/uL   Hemoglobin 12.6 (L) 13.0 - 17.0 g/dL   HCT 95.6 21.3 - 08.6 %   MCV 91.5 80.0 - 100.0 fL   MCH 29.1 26.0 -  34.0 pg   MCHC 31.8 30.0 - 36.0 g/dL   RDW 16.1 09.6 - 04.5 %   Platelets 252 150 - 400 K/uL   nRBC 0.0 0.0 - 0.2 %    Comment: Performed at [facility], [address] [place], [place] [address]  Glucose, capillary     Status: Abnormal   Collection Time: 01/31/23  7:20 AM  Result Value Ref Range   Glucose-Capillary 156 (H) 70 - 99 mg/dL    Comment: Glucose reference range applies only to samples taken after fasting for at least 8 hours.  Glucose, capillary     Status: Abnormal   Collection Time: 01/31/23 11:04 AM  Result Value Ref Range   Glucose-Capillary 136 (H) 70 - 99 mg/dL    Comment: Glucose reference range applies only to samples taken after fasting for at least 8 hours.  Glucose, capillary     Status: Abnormal   Collection Time: 01/31/23  4:04 PM  Result Value Ref Range   Glucose-Capillary 141 (H) 70 - 99 mg/dL    Comment: Glucose reference range applies only to samples taken after fasting for at least 8 hours.  Glucose, capillary     Status: Abnormal   Collection Time: 01/31/23  9:27 PM  Result Value Ref Range   Glucose-Capillary 130 (H) 70 - 99 mg/dL    Comment: Glucose reference range applies only to samples taken after fasting for at least 8 hours.    CT CORONARY MORPH W/CTA COR W/SCORE W/CA W/CM &/OR WO/CM  Addendum Date: 01/30/2023   ADDENDUM REPORT: 01/30/2023 14:10 CLINICAL DATA:  36M with severe aortic stenosis being evaluated for a TAVR procedure. EXAM: Cardiac TAVR CT TECHNIQUE: The patient was scanned on a [employer]. A 120 kV retrospective  scan was triggered in the descending thoracic aorta at 111 HU's. Gantry rotation speed was 250 msecs and collimation was .6 mm. No beta blockade or nitro were given. The 3D data set was reconstructed in 5% intervals of the R-R cycle. Systolic and diastolic phases were analyzed on a dedicated work station using MPR, MIP and VRT modes. The patient received 100 cc of contrast. FINDINGS: Aortic Root: Aortic valve: Trileaflet Aortic annulus: Diameter: 29mm x 27mm Perimeter: 92mm Area: 657 mm^2 Calcifications: No calcifications Coronary height: Min Left - 16mm, Min Right - 17mm Sinotubular height: Left cusp - 24mm; Right cusp - 27mm; Noncoronary cusp - 29mm LVOT (as measured 3 mm below the annulus): Diameter: 30mm x 27mm Area: 735 mm^2 Calcifications: No calcifications Aortic sinus width: Left cusp - 37mm; Right cusp - 37mm; Noncoronary cusp - 37mm Sinotubular junction width: 34mm x 33mm Optimum Fluoroscopic Angle for Delivery: RAO 5 CRA 10 IMPRESSION: 1. Poor contrast opacification, which affects accuracy of TAVR measurements 2.  Tricuspid aortic valve with severe calcifications 3. Aortic annulus measures 29mm x 27mm in diameter with perimeter 92mm and area 657 mm^2. No annular or LVOT calcifications. Annular measurements are suitable for placement of 29mm Sapien 3 valve 4. Sufficient coronary to annulus distance. 5. Optimum Fluoroscopic Angle for Delivery:  RAO 5 CRA 10 6. See prior study on 01/27/23 for aortic valve and coronary calcium scores Electronically Signed   By: Epifanio Lesches M.D.   On: 01/30/2023 14:10   Result Date: 01/30/2023 EXAM: OVER-READ INTERPRETATION  CT CHEST The following report is a limited chest CT over-read performed by radiologist Dr. Duanne Guess of [facility] on 01/30/2023. This over-read does not include interpretation of cardiac or  coronary anatomy or pathology. The coronary CTA interpretation by the cardiologist is attached. COMPARISON:  01/27/2023 FINDINGS: Stable  heart size. No pericardial effusion. Image thoracic aorta is nonaneurysmal. Aortic and coronary artery atherosclerosis. Central pulmonary vasculature within normal limits. Mildly enlarged subcarinal and bilateral hilar lymph nodes, unchanged. Atelectatic changes within the dependent lung fields. Imaged lung fields are otherwise clear. No acute bony or chest wall abnormality. IMPRESSION: 1. No new or acute extracardiac findings. 2. Mildly enlarged subcarinal and bilateral hilar lymph nodes, unchanged. 3. Aortic and coronary artery atherosclerosis (ICD10-I70.0). Electronically Signed: By: Duanne Guess D.O. On: 01/30/2023 13:19    Maxillofacial CT sub-optimal for dental caries. Unable to obtain Orthopanorex due to patient size.   PE Blood pressure 103/78, pulse 68, temperature 97.7 F (36.5 C), temperature source Oral, resp. rate 16, height 6\' 2"  (1.88 m), weight (!) 166.2 kg, SpO2 92 %. General appearance: alert, no distress, and morbidly obese Head: Normocephalic, without obvious abnormality, atraumatic Eyes: conjunctivae/corneas clear. PERRL, EOM's intact. Fundi benign. Nose: Nares normal. Septum midline. Mucosa normal. No drainage or sinus tenderness. Throat: Multiple dental caries/exposed retained dental roots  teeth # 2, 3, 5, 7, 13, 14, 19, 29, 30, 31, mobile teeth # 24, 25.  Gingiva pink, well perfused. No mobility of other teeth. No purulence, edema, fluctuance or lesions noted.  Neck: no adenopathy  Assessment/Plan: Multiple non-restorable teeth. Plan dental extractions as indicated under general anesthesia 02/03/2023.  [name] 02/01/2023, 6:42 AM

## 2023-02-01 NOTE — Progress Notes (Signed)
   01/31/23 2300  BiPAP/CPAP/SIPAP  BiPAP/CPAP/SIPAP Pt Type Adult  Reason BIPAP/CPAP not in use Non-compliant  BiPAP/CPAP /SiPAP Vitals  Temp (!) 97.5 F (36.4 C)  Pulse Rate 82  Resp 16  BP 117/67  SpO2 91 %  MEWS Score/Color  MEWS Score 0  MEWS Score Color [place] Si

## 2023-02-01 NOTE — Progress Notes (Addendum)
Patient Name: Lawrence [name] Date of Encounter: 02/01/2023 [facility] Cardiologist: Armanda Magic, MD   Interval Summary  .    67 y.o. male with a history of non-obstructive CAD noted on cardiac catheterization in 2019, chronic diastolic CHF, paroxysmal atrial fibrillation on Eliquis, severe aortic stenosis noted on recent Echo in 12/2022, hypertension, hyperlipidemia, type 2 diabetes mellitus with peripheral neuropathy, COPD, obstructive sleep apnea on CPAP, seizure disorder, and prior tobacco use (quit about 6 years ago) who was admitted 01/22/23 with acute hypoxic respiratory failure in setting of a/c diastolic HF, found to have severe AS as well.   Today feels better.   Still with chronic chest pain and respirations are improved.  Less edema   Dental extraction on 02/03/23 under general anesthesia.   Vital Signs .    Vitals:   01/31/23 1940 01/31/23 2300 02/01/23 0620 02/01/23 0747  BP: 114/69 117/67 103/78   Pulse: 72 82 68   Resp: 16 16 16 18   Temp: 97.9 F (36.6 C) (!) 97.5 F (36.4 C) 97.7 F (36.5 C) 97.7 F (36.5 C)  TempSrc: Oral Oral Oral Oral  SpO2: 92% 91% 92%   Weight:   (!) (P) 166 kg   Height:        Intake/Output Summary (Last 24 hours) at 02/01/2023 0811 Last data filed at 02/01/2023 0411 Gross per 24 hour  Intake 483 ml  Output 5025 ml  Net -4542 ml      02/01/2023    6:20 AM 01/31/2023    4:37 AM 01/30/2023    5:28 AM  Last 3 Weights  Weight (lbs) 365 lb 15.4 oz 366 lb 6.5 oz 362 lb 12.8 oz  Weight (kg) 166 kg 166.2 kg 164.565 kg      Telemetry/ECG    SR with freq PVCs at times 1st degree AV block  - Personally Reviewed  Cath 01/26/23   Prox LAD to Mid LAD lesion is 40% stenosed.   Prox Cx to Mid Cx lesion is 20% stenosed.   Ost 2nd Mrg lesion is 20% stenosed.   Mid RCA lesion is 40% stenosed.   Prox Cx lesion is 20% stenosed.   1.  Mild to moderate diffuse obstructive coronary artery disease without high-grade obstructions. 2.   Fick cardiac output of 9.7 L/min and Fick cardiac index of 3.5 L/min/m with the following hemodynamics:            Right atrial pressure mean of 9 mmHg            Right ventricular pressure 36/4 with an end-diastolic pressure of 16 mmHg            Mean wedge pressure of 14 mmHg            Pulmonary artery pressure of 34/15 with a mean of 24 mmHg   Recommendation: Continue evaluation for aortic valve intervention.   2D echo 12/22/22   1. Left ventricular ejection fraction, by estimation, is 60 to 65%. The  left ventricle has normal function. The left ventricle has no regional  wall motion abnormalities. There is mild left ventricular hypertrophy.  Left ventricular diastolic parameters  were normal.   2. Right ventricular systolic function is normal. The right ventricular  size is normal.   3. Left atrial size was moderately dilated.   4. The mitral valve is abnormal. No evidence of mitral valve  regurgitation. No evidence of mitral stenosis.   5. AS has progressed since TTE done  05/2022. The aortic valve was not  well visualized. There is moderate calcification of the aortic valve.  There is moderate thickening of the aortic valve. Aortic valve  regurgitation is not visualized. Severe aortic  valve stenosis.   6. Aortic dilatation noted. There is mild dilatation of the aortic root,  measuring 38 mm. There is mild dilatation of the ascending aorta,  measuring 40 mm.   7. The inferior vena cava is normal in size with greater than 50%  respiratory variability, suggesting right atrial pressure of 3 mmHg.     Physical Exam .   GEN: No acute distress.   Neck: No JVD Cardiac: muffled RRR, no murmurs, rubs, or gallops.  Respiratory: Clear to auscultation bilaterally. With occ wheeze  GI: Soft, nontender, non-distended  MS: less edema  Assessment & Plan .    Acute hypoxic respiratory failure in setting of acute on chronic diastolic CHF, severe aortic stenosis, HTN with some hypotension  as well - recent echo on 12/22/2022 showed 60-65% with normal wall motion and diastolic parameters but progression of known aortic stenosis to the severe range. Cardiomems on 01/16/2023 was mildly elevated suggestive of fluid accumulation. - diuresed well with IV Lasix (also takes metolazone once a week on Tuesdays), stopped 01/26/23 due to soft BP in anticipation of cath  (neg 12,197 since admit and wt only down 2 lbs.   now 365 lbs was 367.8 on the 18th    neg [id number] now.   - oral torsemide 20 resumed 01/28/23 -> still with impressive LE edema on exam but appears to be diuresing by I/O's,metolazone added today for one time 5 mg may need twice per week but second dose at 2.5 mg will add for tomorrow  - continue Toprol, Farxiga, spironolctone - BP remains low normal but appears to be tolerating - cath findings with nonobstructive CAD, ongoing w/u for TAVR by structural heart team - unable to obtain adequate pre-TAVR planning CT on Friday, for re-try of cardiac CT today for TAVR planning (suboptimal images Friday and no CT reader over the weekend) - TAVR team following behind the scenes for timing of procedure -is also on minipress 2 mg at hs and dilt 120 mg  -BP 113/71    -labs ordered but not yet done      Chest Pain, non-obstructive CAD, HLD - chronic intermittent atypical CP for 6-7 months - negative troponins for similar discomfort on admission, EKG reviewed with Dr. Anne Fu on Friday and felt reassuring, cath also with nonobstructive disease, not entirely typical for aortic stenosis  episodic chest pain. - analgesia with Tylenol, oxycodone helped - not on ASA with concomitant Eliquis - continue statin    H/o PAF (on OAC PTA) with frequent PVCs on telemetry - Eliquis was on hold for cath, resumed 6/22  for dental extractions HOLD ? - continue Toprol and diltiazem - potassium as below - Mg has been normal   Hypokalemia - K 3.6, up from 3.4 given supplemental K+ yesterday will give now  then this afternoon - will add Kdur 20 meq daily  labs pending     Otherwise, per primary team: - COPD - Type 2 diabetes mellitus - Seizure disorder - Anxiety and depression - OSA on CPAP --rash rt leg    For questions or updates, please contact [facility] Please consult [email or web address] for contact info under        Signed, Nada Boozer, NP     Personally seen and examined. Agree  with APP above with the following comments: Patient notes significant improved after Torsemide. Breathing is better.  Legs are less swollen.  He can do more with therapy He is planned for dental extraction on Friday  Exam notable for  Poor dentition, distant heart sounds, improved LE edema, Morbid obesity, CTAB, no further LE blisters Labs notable for hypokalemia Still having frequent PVCs.  Would recommend  - at this point, I suspect he will be here until his TAVR - planned for OR dental extraction Friday - planned to see TCTS later this week - Likely TAVR on Tuesday - will give extra K and metolazone 2.5 on Thursday - if no improvement; low threshold to re-consult AHF (Dr. Shirlee Latch).  Riley Lam, MD FASE [facility] Cardiologist [facility]  [address] [place], [address] [place], [place] [address] [phone]  9:23 AM

## 2023-02-01 NOTE — Progress Notes (Signed)
Patient called out to RN with chest pain. Patient describing 4/10 squeezing chest pain. This RN paged [place] [facility], EKG was done and placed in the chart.

## 2023-02-01 NOTE — Progress Notes (Signed)
PROGRESS NOTE    Lawrence [name]  ZOX:[id number] DOB: 10/23/1955 DOA: 01/22/2023 PCP: [name], PA-C   Brief Narrative:  67 y.o. male with history of hypertension,CHF, PAF on Eliquis, aortic stenosis, CAD, CVA, diabetes mellitus type 2, COPD, AAA, prior history of tobacco abuse, morbid obesity, and OSA on CPAP presented with complaints of shortness of breath, intermittent left sided chest pain over the past 2 weeks. Patient being evaluated in the outpatient setting with tentatively planned right/left heart cath June 25. Hospitalist called for admission - cardiology called in consult.  Eventually patient was noted to have severe aortic stenosis with cardiology planning on TAVR 7/2.  Prior to this due to poor condition oral surgery was consulted who is planning on teeth extraction under general anesthesia on 6/28.   Assessment & Plan:  Principal Problem:   Acute hypoxic respiratory failure (HCC) Active Problems:   Chest pain   Chronic diastolic CHF (congestive heart failure) (HCC)   OSA (obstructive sleep apnea)   Type 2 diabetes mellitus with hyperlipidemia (HCC)   Essential hypertension   Seizure disorder (HCC)   AF (paroxysmal atrial fibrillation) (HCC)   COPD (chronic obstructive pulmonary disease) (HCC)      Acute respiratory failure with hypoxia and hypercapnia/dyspnea on exertion Exertional chest pain, ACS ruled out. Contributed by severe arctic stenosis leading to diastolic CHF.  CTA chest is negative for PE.  Continue bronchodilators.  Left heart cath during this admission did not have any obstructive disease.   Acute on chronic Diastolic congestive heart failure Severe aortic stenosis Echocardiogram in May showed EF of 65% and severe aortic stenosis.  This is intermittently causing shortness of breath and chest pain type of symptoms.  Cardiology team is planning on TAVR 7/2. - Medical management in the meantime.   Hypokalemia: Repletion of electrolytes    COPD Continue home medications: Breo, Incruse, nebs   Type 2 diabetes mellitus well-controlled with long-term use of insulin: HbA1c 6.4.  Sliding scale and Accu-Chek Farxiga, Semglee   Paroxysmal atrial fibrillation  Currently on Eliquis.  Continue Cardizem.   Essential hypertension: Cardiac medications being adjusted by cardiology team. IV as needed   Anxiety and depression Continue as BuSpar, Abilify, venlafaxine   Seizure disorder Continue home Keppra and lacosamide   Obstructive sleep apnea Continue CPAP   Morbid obesity Estimated body mass index is 47.04 kg/m as calculated from the following:   Height as of this encounter: 6\' 2"  (1.88 m).   Weight as of this encounter: 166.2 kg.   DVT prophylaxis Eliquis Code Status:   Code Status: DNR Family Communication: Significant other at bedside Patient's symptoms are too severe to be discharged.  Will likely stay in the hospital until his TAVR on 7/2.  Also planning on dental extraction under general anesthesia on 6/28.     Diet Orders (From admission, onward)     Start     Ordered   01/26/23 1231  Diet heart healthy/carb modified Room service appropriate? Yes; Fluid consistency: Thin  Diet effective now       Question Answer Comment  Room service appropriate? Yes   Fluid consistency: Thin      01/26/23 1231            Subjective:  Feels well but still has exertional shortness of breath  Examination:  General exam: Appears calm and comfortable  Respiratory system: Clear to auscultation. Respiratory effort normal. Cardiovascular system: S1 & S2 heard, RRR. No JVD, murmurs, rubs, gallops or clicks.  Trace bilateral lower extremity edema Gastrointestinal system: Abdomen is nondistended, soft and nontender. No organomegaly or masses felt. Normal bowel sounds heard. Central nervous system: Alert and oriented. No focal neurological deficits. Extremities: Symmetric 5 x 5 power. Skin: No rashes, lesions or  ulcers Psychiatry: Judgement and insight appear normal. Mood & affect appropriate.  Objective: Vitals:   01/31/23 2300 02/01/23 0620 02/01/23 0747 02/01/23 0823  BP: 117/67 103/78  113/71  Pulse: 82 68    Resp: 16 16 18 16   Temp: (!) 97.5 F (36.4 C) 97.7 F (36.5 C) 97.7 F (36.5 C)   TempSrc: Oral Oral Oral   SpO2: 91% 92%    Weight:  (!) (P) 166 kg    Height:        Intake/Output Summary (Last 24 hours) at 02/01/2023 0909 Last data filed at 02/01/2023 0411 Gross per 24 hour  Intake 483 ml  Output 5025 ml  Net -4542 ml   Filed Weights   01/30/23 0528 01/31/23 0437 02/01/23 0620  Weight: (!) 164.6 kg (!) 166.2 kg (!) (P) 166 kg    Scheduled Meds:  allopurinol  200 mg Oral BID   apixaban  5 mg Oral BID   ARIPiprazole  5 mg Oral QHS   atorvastatin  40 mg Oral Daily   busPIRone  10 mg Oral TID   dapagliflozin propanediol  10 mg Oral Q breakfast   diclofenac Sodium  4 g Topical QID   diltiazem  120 mg Oral Daily   fenofibrate  160 mg Oral Daily   fluticasone furoate-vilanterol  1 puff Inhalation Daily   And   umeclidinium bromide  1 puff Inhalation Daily   insulin aspart  0-15 Units Subcutaneous TID WC   insulin aspart  0-5 Units Subcutaneous QHS   insulin glargine-yfgn  30 Units Subcutaneous Daily   lacosamide  150 mg Oral BID   levETIRAcetam  500 mg Oral BID   lidocaine  1 patch Transdermal Q24H   metoprolol succinate  50 mg Oral Daily   mirtazapine  15 mg Oral QHS   pantoprazole  40 mg Oral BID   potassium chloride  20 mEq Oral Daily   prazosin  2 mg Oral QHS   pregabalin  150 mg Oral TID   sodium chloride flush  3 mL Intravenous Q12H   sodium chloride flush  3 mL Intravenous Q12H   spironolactone  12.5 mg Oral Daily   torsemide  20 mg Oral Daily   traZODone  200 mg Oral QHS   venlafaxine XR  225 mg Oral Q breakfast   Continuous Infusions:  Nutritional status     Body mass index is 46.99 kg/m (pended).  Data Reviewed:   CBC: Recent Labs  Lab  01/26/23 1156 01/26/23 1159 01/27/23 0238 01/28/23 0224 01/31/23 0147  WBC  --   --  10.3 10.0 10.1  HGB 14.6 15.3 14.2 13.7 12.6*  HCT 43.0 45.0 44.9 42.5 39.6  MCV  --   --  91.4 88.9 91.5  PLT  --   --  268 265 252   Basic Metabolic Panel: Recent Labs  Lab 01/27/23 0238 01/28/23 0224 01/29/23 0321 01/30/23 0236 01/31/23 0147  NA 137 134* 135 136 138  K 4.0 3.9 3.7 3.4* 3.6  CL 99 98 100 100 103  CO2 30 27 26 27 27   GLUCOSE 112* 112* 102* 101* 121*  BUN 16 17 18 15 16   CREATININE 0.84 0.89 0.82 0.76 0.86  CALCIUM 8.9 8.8*  8.6* 8.6* 8.4*   GFR: Estimated Creatinine Clearance: 140.3 mL/min (by C-G formula based on SCr of 0.86 mg/dL). Liver Function Tests: No results for input(s): "AST", "ALT", "ALKPHOS", "BILITOT", "PROT", "ALBUMIN" in the last 168 hours. No results for input(s): "LIPASE", "AMYLASE" in the last 168 hours. No results for input(s): "AMMONIA" in the last 168 hours. Coagulation Profile: No results for input(s): "INR", "PROTIME" in the last 168 hours. Cardiac Enzymes: No results for input(s): "CKTOTAL", "CKMB", "CKMBINDEX", "TROPONINI" in the last 168 hours. BNP (last 3 results) No results for input(s): "PROBNP" in the last 8760 hours. HbA1C: No results for input(s): "HGBA1C" in the last 72 hours. CBG: Recent Labs  Lab 01/31/23 0720 01/31/23 1104 01/31/23 1604 01/31/23 2127 02/01/23 0746  GLUCAP 156* 136* 141* 130* 189*   Lipid Profile: No results for input(s): "CHOL", "HDL", "LDLCALC", "TRIG", "CHOLHDL", "LDLDIRECT" in the last 72 hours. Thyroid Function Tests: No results for input(s): "TSH", "T4TOTAL", "FREET4", "T3FREE", "THYROIDAB" in the last 72 hours. Anemia Panel: No results for input(s): "VITAMINB12", "FOLATE", "FERRITIN", "TIBC", "IRON", "RETICCTPCT" in the last 72 hours. Sepsis Labs: No results for input(s): "PROCALCITON", "LATICACIDVEN" in the last 168 hours.  Recent Results (from the past 240 hour(s))  Respiratory (~20 pathogens)  panel by PCR     Status: None   Collection Time: 01/23/23  8:35 AM   Specimen: Nasopharyngeal Swab; Respiratory  Result Value Ref Range Status   Adenovirus NOT DETECTED NOT DETECTED Final   Coronavirus 229E NOT DETECTED NOT DETECTED Final    Comment: (NOTE) The Coronavirus on the Respiratory Panel, DOES NOT test for the novel  Coronavirus (2019 nCoV)    Coronavirus HKU1 NOT DETECTED NOT DETECTED Final   Coronavirus NL63 NOT DETECTED NOT DETECTED Final   Coronavirus OC43 NOT DETECTED NOT DETECTED Final   Metapneumovirus NOT DETECTED NOT DETECTED Final   Rhinovirus / Enterovirus NOT DETECTED NOT DETECTED Final   Influenza A NOT DETECTED NOT DETECTED Final   Influenza B NOT DETECTED NOT DETECTED Final   Parainfluenza Virus 1 NOT DETECTED NOT DETECTED Final   Parainfluenza Virus 2 NOT DETECTED NOT DETECTED Final   Parainfluenza Virus 3 NOT DETECTED NOT DETECTED Final   Parainfluenza Virus 4 NOT DETECTED NOT DETECTED Final   Respiratory Syncytial Virus NOT DETECTED NOT DETECTED Final   Bordetella pertussis NOT DETECTED NOT DETECTED Final   Bordetella Parapertussis NOT DETECTED NOT DETECTED Final   Chlamydophila pneumoniae NOT DETECTED NOT DETECTED Final   Mycoplasma pneumoniae NOT DETECTED NOT DETECTED Final    Comment: Performed at [facility], [address], [place], [place] [address]  SARS Coronavirus 2 by RT PCR (hospital order, performed in [facility] hospital lab) *cepheid single result test* Anterior Nasal Swab     Status: None   Collection Time: 01/23/23  8:35 AM   Specimen: Anterior Nasal Swab  Result Value Ref Range Status   SARS Coronavirus 2 by RT PCR NEGATIVE NEGATIVE Final    Comment: Performed at [facility], [address], [place], [place] [address]         Radiology Studies: CT CORONARY MORPH W/CTA COR W/SCORE W/CA W/CM &/OR WO/CM  Addendum Date: 01/30/2023   ADDENDUM REPORT: 01/30/2023 14:10 CLINICAL DATA:  24M with severe aortic stenosis being  evaluated for a TAVR procedure. EXAM: Cardiac TAVR CT TECHNIQUE: The patient was scanned on a [employer]. A 120 kV retrospective scan was triggered in the descending thoracic aorta at 111 HU's. Gantry rotation speed  was 250 msecs and collimation was .6 mm. No beta blockade or nitro were given. The 3D data set was reconstructed in 5% intervals of the R-R cycle. Systolic and diastolic phases were analyzed on a dedicated work station using MPR, MIP and VRT modes. The patient received 100 cc of contrast. FINDINGS: Aortic Root: Aortic valve: Trileaflet Aortic annulus: Diameter: 29mm x 27mm Perimeter: 92mm Area: 657 mm^2 Calcifications: No calcifications Coronary height: Min Left - 16mm, Min Right - 17mm Sinotubular height: Left cusp - 24mm; Right cusp - 27mm; Noncoronary cusp - 29mm LVOT (as measured 3 mm below the annulus): Diameter: 30mm x 27mm Area: 735 mm^2 Calcifications: No calcifications Aortic sinus width: Left cusp - 37mm; Right cusp - 37mm; Noncoronary cusp - 37mm Sinotubular junction width: 34mm x 33mm Optimum Fluoroscopic Angle for Delivery: RAO 5 CRA 10 IMPRESSION: 1. Poor contrast opacification, which affects accuracy of TAVR measurements 2.  Tricuspid aortic valve with severe calcifications 3. Aortic annulus measures 29mm x 27mm in diameter with perimeter 92mm and area 657 mm^2. No annular or LVOT calcifications. Annular measurements are suitable for placement of 29mm Sapien 3 valve 4. Sufficient coronary to annulus distance. 5. Optimum Fluoroscopic Angle for Delivery:  RAO 5 CRA 10 6. See prior study on 01/27/23 for aortic valve and coronary calcium scores Electronically Signed   By: Epifanio Lesches M.D.   On: 01/30/2023 14:10   Result Date: 01/30/2023 EXAM: OVER-READ INTERPRETATION  CT CHEST The following report is a limited chest CT over-read performed by radiologist Dr. Duanne Guess of [facility] on 01/30/2023. This over-read does not include interpretation of cardiac  or coronary anatomy or pathology. The coronary CTA interpretation by the cardiologist is attached. COMPARISON:  01/27/2023 FINDINGS: Stable heart size. No pericardial effusion. Image thoracic aorta is nonaneurysmal. Aortic and coronary artery atherosclerosis. Central pulmonary vasculature within normal limits. Mildly enlarged subcarinal and bilateral hilar lymph nodes, unchanged. Atelectatic changes within the dependent lung fields. Imaged lung fields are otherwise clear. No acute bony or chest wall abnormality. IMPRESSION: 1. No new or acute extracardiac findings. 2. Mildly enlarged subcarinal and bilateral hilar lymph nodes, unchanged. 3. Aortic and coronary artery atherosclerosis (ICD10-I70.0). Electronically Signed: By: Duanne Guess D.O. On: 01/30/2023 13:19           LOS: 9 days   Time spent= 35 mins    Saleema Weppler Joline Maxcy, MD [facility]  If 7PM-7AM, please contact night-coverage  02/01/2023, 9:09 AM

## 2023-02-01 NOTE — Progress Notes (Signed)
   02/01/23 2210  BiPAP/CPAP/SIPAP  BiPAP/CPAP/SIPAP Pt Type Adult  BiPAP/CPAP/SIPAP Resmed  Reason BIPAP/CPAP not in use Non-compliant (Pt. refused.)

## 2023-02-01 NOTE — Progress Notes (Signed)
Physical Therapy Treatment Patient Details Name: Lawrence [name] MRN: [id number] DOB: 05/26/1956 Today's Date: 02/01/2023   History of Present Illness Pt is a 67 y/o male who presents 01/22/23 with SOB and intermittent L sided CP for ~2 weeks PTA. R/L heart cath scheduled for 6/20 while inpatient. S/P heart cath 6/20. PMH significant for HTN, CHF, PAF on Eliquis, aortic stenosis, CAD, CVA, DM II, COPD, AAA.    PT Comments    Tolerated treatment well, eager to get OOB, however still easily fatigued with short distance bouts of gait. performed (see below.) SpO2 maintained 90-93% throughout session 2L supplemental O2, HR in 80s with activity. Min assist for transfers. Encouraged OOB frequently throughout the day with staff assist and to sit in recliner for meals. Patient will continue to benefit from skilled physical therapy services to further improve independence with functional mobility.    5 Meter Walk Test:  Trial 1 52.1 seconds  Trial 2 57.6 seconds  *Trial 3 *N/a seconds  3 Trial Average/Gait Speed 0.29 ft/sec  (<1.8 ft/sec indicates high fall risk)    *Patient too fatigued to tolerate 3rd trial of             Recommendations for follow up therapy are one component of a multi-disciplinary discharge planning process, led by the attending physician.  Recommendations may be updated based on patient status, additional functional criteria and insurance authorization.  Follow Up Recommendations       Assistance Recommended at Discharge Intermittent Supervision/Assistance  Patient can return home with the following A little help with walking and/or transfers;A little help with bathing/dressing/bathroom;Assistance with cooking/housework;Assist for transportation;Help with stairs or ramp for entrance   Equipment Recommendations  None recommended by PT    Recommendations for Other Services       Precautions / Restrictions Precautions Precautions: Fall Precaution  Comments: monitor HR and sats, reports of dizziness Restrictions Weight Bearing Restrictions: No     Mobility  Bed Mobility Overal bed mobility: Needs Assistance Bed Mobility: Supine to Sit, Sit to Supine     Supine to sit: Supervision Sit to supine: Supervision   General bed mobility comments: Superivison for safety, requires extra time. Effortful to raise LEs into bed but performed without physical assist today.    Transfers Overall transfer level: Needs assistance Equipment used: Rolling walker (2 wheels) Transfers: Sit to/from Stand Sit to Stand: From elevated surface, Min assist           General transfer comment: Min assist for boost to stand from bed, after pt attempted several times on his own. Min assist for boost to stand from standard chair with arm rests, cues for hand placement.    Ambulation/Gait Ambulation/Gait assistance: Min guard Gait Distance (Feet): 80 Feet ( x2) Assistive device: Rolling walker (2 wheels) Gait Pattern/deviations: Step-to pattern, Step-through pattern, Decreased stride length, Antalgic, Trunk flexed, Wide base of support Gait velocity: Decreased Gait velocity interpretation: <1.31 ft/sec, indicative of household ambulator   General Gait Details: Educated on AD use to improve efficency with gait. Heavy UE reliance on RW. Intermittent step-to pattern but progressing nicely. No buckling noted. Dizziness towards end of bouts. SpO2 90-93% on 2L supplemental o2.   Stairs             Wheelchair Mobility    Modified Rankin (Stroke Patients Only)       Balance Overall balance assessment: Needs assistance Sitting-balance support: No upper extremity supported, Feet supported Sitting balance-Leahy Scale: Good  Standing balance support: Bilateral upper extremity supported Standing balance-Leahy Scale: Poor Standing balance comment: reliant on RW support                            Cognition Arousal/Alertness:  Awake/alert Behavior During Therapy: WFL for tasks assessed/performed, Flat affect Overall Cognitive Status: Within Functional Limits for tasks assessed                                          Exercises      General Comments General comments (skin integrity, edema, etc.): See results above (only tolerated x2)      Pertinent Vitals/Pain Pain Assessment Pain Assessment: Faces Faces Pain Scale: Hurts a little bit Pain Location: feet Pain Descriptors / Indicators: Aching, Discomfort Pain Intervention(s): Monitored during session, Repositioned    Home Living                          Prior Function            PT Goals (current goals can now be found in the care plan section) Acute Rehab PT Goals Patient Stated Goal: Back to baseline of function PT Goal Formulation: With patient Time For Goal Achievement: 02/08/23 Potential to Achieve Goals: Good Progress towards PT goals: Progressing toward goals    Frequency    Min 1X/week      PT Plan Current plan remains appropriate    Co-evaluation              AM-PAC PT "6 Clicks" Mobility   Outcome Measure  Help needed turning from your back to your side while in a flat bed without using bedrails?: A Little Help needed moving from lying on your back to sitting on the side of a flat bed without using bedrails?: A Little Help needed moving to and from a bed to a chair (including a wheelchair)?: A Little Help needed standing up from a chair using your arms (e.g., wheelchair or bedside chair)?: A Lot Help needed to walk in hospital room?: A Little Help needed climbing 3-5 steps with a railing? : Total 6 Click Score: 15    End of Session Equipment Utilized During Treatment: Gait belt;Oxygen Activity Tolerance: Patient limited by fatigue Patient left: with call bell/phone within reach;with family/visitor present;in bed;with bed alarm set Nurse Communication: Mobility status PT Visit  Diagnosis: Unsteadiness on feet (R26.81);Muscle weakness (generalized) (M62.81);Difficulty in walking, not elsewhere classified (R26.2)     Time: 1610-9604 PT Time Calculation (min) (ACUTE ONLY): 28 min  Charges:  $Gait Training: 8-22 mins $Therapeutic Activity: 8-22 mins                     Kathlyn Sacramento, PT, DPT [facility]  Rehabilitation Services Physical Therapist Office: [phone] Website: [email or web address]   Berton Mount 02/01/2023, 9:59 AM

## 2023-02-02 ENCOUNTER — Encounter: Payer: Medicare HMO | Admitting: Surgery

## 2023-02-02 DIAGNOSIS — I350 Nonrheumatic aortic (valve) stenosis: Secondary | ICD-10-CM

## 2023-02-02 DIAGNOSIS — I5032 Chronic diastolic (congestive) heart failure: Secondary | ICD-10-CM

## 2023-02-02 DIAGNOSIS — I480 Paroxysmal atrial fibrillation: Secondary | ICD-10-CM

## 2023-02-02 DIAGNOSIS — J9601 Acute respiratory failure with hypoxia: Secondary | ICD-10-CM | POA: Diagnosis not present

## 2023-02-02 LAB — BASIC METABOLIC PANEL
Anion gap: 9 (ref 5–15)
Anion gap: 11 (ref 5–15)
BUN: 17 mg/dL (ref 8–23)
BUN: 18 mg/dL (ref 8–23)
CO2: 29 mmol/L (ref 22–32)
CO2: 28 mmol/L (ref 22–32)
Calcium: 8.9 mg/dL (ref 8.9–10.3)
Chloride: 96 mmol/L — ABNORMAL LOW (ref 98–111)
Creatinine, Ser: 0.86 mg/dL (ref 0.61–1.24)
Creatinine, Ser: 1.27 mg/dL — ABNORMAL HIGH (ref 0.61–1.24)
GFR, Estimated: >60 mL/min (ref >60)
Glucose, Bld: 120 mg/dL — ABNORMAL HIGH (ref 70–99)
Glucose, Bld: 213 mg/dL — ABNORMAL HIGH (ref 70–99)
Potassium: 3.3 mmol/L — ABNORMAL LOW (ref 3.5–5.1)
Potassium: 3.9 mmol/L (ref 3.5–5.1)
Sodium: 136 mmol/L (ref 135–145)
Sodium: 135 mmol/L (ref 135–145)

## 2023-02-02 LAB — CBC
HCT: 41.9 % (ref 39.0–52.0)
Hemoglobin: 13.6 g/dL (ref 13.0–17.0)
MCH: 29.3 pg (ref 26.0–34.0)
MCHC: 32.5 g/dL (ref 30.0–36.0)
MCV: 90.3 fL (ref 80.0–100.0)
Platelets: 263 10*3/uL (ref 150–400)
RBC: 4.64 MIL/uL (ref 4.22–5.81)
RDW: 15.2 % (ref 11.5–15.5)
WBC: 11.6 10*3/uL — ABNORMAL HIGH (ref 4.0–10.5)
nRBC: 0 % (ref 0.0–0.2)

## 2023-02-02 LAB — BASIC METABOLIC PANEL WITH GFR
Calcium: 8.7 mg/dL — ABNORMAL LOW (ref 8.9–10.3)
Chloride: 98 mmol/L (ref 98–111)
GFR, Estimated: >60 mL/min (ref >60)

## 2023-02-02 LAB — GLUCOSE, CAPILLARY
Glucose-Capillary: 223 mg/dL — ABNORMAL HIGH (ref 70–99)
Glucose-Capillary: 126 mg/dL — ABNORMAL HIGH (ref 70–99)
Glucose-Capillary: 179 mg/dL — ABNORMAL HIGH (ref 70–99)
Glucose-Capillary: 189 mg/dL — ABNORMAL HIGH (ref 70–99)

## 2023-02-02 LAB — MAGNESIUM: Magnesium: 2 mg/dL (ref 1.7–2.4)

## 2023-02-02 MED ORDER — INSULIN ASPART 100 UNIT/ML IJ SOLN
3.0000 [IU] | Freq: Three times a day (TID) | INTRAMUSCULAR | Status: DC
  Administered 2023-02-02 – 2023-02-08 (×15): 3 [IU] via SUBCUTANEOUS

## 2023-02-02 MED ORDER — POTASSIUM CHLORIDE CRYS ER 20 MEQ PO TBCR: 40.0000 meq | EXTENDED_RELEASE_TABLET | Freq: Every day | ORAL | Status: DC

## 2023-02-02 MED ORDER — POTASSIUM CHLORIDE CRYS ER 20 MEQ PO TBCR
40.0000 meq | EXTENDED_RELEASE_TABLET | Freq: Every day | ORAL | Status: DC
  Administered 2023-02-02: 40 meq via ORAL
  Filled 2023-02-02 (×2): qty 4

## 2023-02-02 MED ORDER — POTASSIUM CHLORIDE CRYS ER 20 MEQ PO TBCR
40.0000 meq | EXTENDED_RELEASE_TABLET | ORAL | Status: AC
  Administered 2023-02-02 (×2): 40 meq via ORAL
  Filled 2023-02-02: qty 2
  Filled 2023-02-02: qty 4

## 2023-02-02 MED ORDER — METOLAZONE 5 MG PO TABS
2.5000 mg | ORAL_TABLET | Freq: Once | ORAL | Status: AC
  Administered 2023-02-02: 2.5 mg via ORAL
  Filled 2023-02-02: qty 1

## 2023-02-02 NOTE — Consult Note (Signed)
 [facility]   [facility]       [address]       [place] [address]             [phone]          CARDIOTHORACIC SURGERY CONSULTATION REPORT  PCP is Wharton, Courtney, PA-C [facility] Providers Cardiologist:  Lawrence Turner, MD  Electrophysiologist:  Lawrence Martin Camnitz, MD  Advanced Heart Failure:  Lawrence McLean, MD     Reason for consultation:  Severe aortic stenosis  HPI:  Lawrence [name] is a 66-year-old gentleman with a history of chronic diastolic dysfunction, paroxysmal atrial fibrillation on Eliquis, elevated BMI of 46, COPD, obstructive sleep apnea on CPAP, chronic back pain, seizure disorder, and an echocardiogram done recently which demonstrated severe aortic stenosis who was admitted with acute on chronic diastolic heart failure.  The patient has been followed for a long time by Dr. McLean who last saw him on April 29 of this year.  He reported being short of breath with pretty minimal exertion.  He does use a walker because of chronic back pain issues.  He is quite deconditioned and has been working with physical therapy.  His weight was noted to be up by almost 20 pounds at that time as well.  His regimen of torsemide 80 mg twice daily, Farxiga 10 mg daily, and spironolactone 12.5 mg daily as well as Toprol and diltiazem were continued.  Metolazone 2.5 mg every Tuesday was added.  He was referred for an echocardiogram which demonstrated severe aortic stenosis.  There were plans for referral to structural heart for further evaluation and recommendations regarding treatment.  Unfortunately he developed increasing chest pain and shortness of breath.  He presented to the emergency department on June 17.  A PE protocol CT was negative.  Troponins x 2 were negative and his BNP was not elevated at 25.  Other laboratories were relatively reassuring.  He was noted to be relatively hypoxic in the high 80s and low 90s.   Supplemental oxygen was administered. He was stabilized medically and diuresed close to 10 L.  He feels much much better.  He is able to lay flat without any issues.   He has a low functional capacity.  He does like to walk around his house and property with his walker and gets out of the house with a scooter,  however he has found that he has had increasing shortness of breath with lower lower levels of exertion.  He has had some infrequent presyncope but no frank syncope.  He has had chest tightness at times but this is not a regular or routine occurrence.  He has noted some peripheral edema and again problems with laying flat on his back.   He underwent coronary angiography and right heart catheterization today which showed mild to moderate obstructive coronary artery disease, preserved cardiac output, and a mean RA pressure of 9 mmHg with a mean wedge pressure of 14 mmHg.   He lives with his significant other.  He used to work at [employer].  He quit smoking sometime ago.  Past Medical History:  Diagnosis Date   Acquired dilation of ascending aorta and aortic root (HCC)    40mm by echo 01/2021   Adenomatous colon polyp 2007   Anemia    Anxiety    Asthma    BPH without obstruction/lower urinary tract symptoms 02/22/2017   Chronic diastolic (congestive) heart failure (HCC)      Chronic venous stasis 03/07/2019   COPD (chronic obstructive pulmonary disease) (HCC)    Coronary artery calcification seen on CAT scan    Depression    Diabetic neuropathy (HCC) 09/11/2019   History of colon polyps 08/24/2018   Hypertension    Morbid obesity (HCC)    OSA (obstructive sleep apnea)    Pain due to onychomycosis of toenails of both feet 09/11/2019   Peripheral neuropathy 02/22/2017   Primary osteoarthritis, left shoulder 03/05/2017   PTSD (post-traumatic stress disorder)    Pure hypercholesterolemia    QT prolongation 03/07/2019   Seizures (HCC)    Severe aortic stenosis    Sinus tachycardia  03/07/2019   Sleep apnea    CPAP   Type 2 diabetes mellitus with vascular disease (HCC) 09/11/2019    Past Surgical History:  Procedure Laterality Date   ENDOVENOUS ABLATION SAPHENOUS VEIN W/ LASER Right 08/20/2020   endovenous laser ablation right greater saphenous vein by Chris Dickson MD    ENDOVENOUS ABLATION SAPHENOUS VEIN W/ LASER Left 11/16/2022   endovenous laser ablation left greater saphenous vein by Chris Dickson MD   JOINT REPLACEMENT     left knee replacement x 2   KNEE ARTHROSCOPY Bilateral    LEFT HEART CATH AND CORONARY ANGIOGRAPHY N/A 01/17/2018   Procedure: LEFT HEART CATH AND CORONARY ANGIOGRAPHY;  Surgeon: Harding, David W, MD;  Location: [facility];  Service: Cardiovascular;  Laterality: N/A;   PRESSURE SENSOR/CARDIOMEMS N/A 08/26/2021   Procedure: PRESSURE SENSOR/CARDIOMEMS;  Surgeon: [name], Lawrence S, MD;  Location: [facility];  Service: Cardiovascular;  Laterality: N/A;   RIGHT HEART CATH N/A 08/26/2021   Procedure: RIGHT HEART CATH;  Surgeon: [name], Lawrence S, MD;  Location: [facility];  Service: Cardiovascular;  Laterality: N/A;   RIGHT HEART CATH AND CORONARY ANGIOGRAPHY N/A 01/26/2023   Procedure: RIGHT HEART CATH AND CORONARY ANGIOGRAPHY;  Surgeon: Thukkani, Arun K, MD;  Location: [facility];  Service: Cardiovascular;  Laterality: N/A;   TEE WITHOUT CARDIOVERSION N/A 08/26/2021   Procedure: TRANSESOPHAGEAL ECHOCARDIOGRAM (TEE);  Surgeon: [name], Lawrence S, MD;  Location: [facility];  Service: Cardiovascular;  Laterality: N/A;   UMBILICAL HERNIA REPAIR      Family History  Problem Relation Age of Onset   CAD Maternal Grandfather    Diabetes Other    Diabetes Mellitus II Neg Hx    Colon cancer Neg Hx    Esophageal cancer Neg Hx    Inflammatory bowel disease Neg Hx    Liver disease Neg Hx    Pancreatic cancer Neg Hx    Rectal cancer Neg Hx    Stomach cancer Neg Hx    Sleep apnea Neg Hx     Social History    Socioeconomic History   Marital status: Significant Other    Spouse name: Janet Ayotte   Number of children: 1   Years of education: Not on file   Highest education level: Associate degree: academic program  Occupational History   Occupation: retired    Comment: [employer]  Tobacco Use   Smoking status: Former    Packs/day: 1.00    Years: 44.00    Additional pack years: 0.00    Total pack years: 44.00    Types: Cigarettes    Quit date: 03/07/2016    Years since quitting: 6.9    Passive exposure: Past   Smokeless tobacco: Never  Vaping Use   Vaping Use: Never used  Substance and Sexual   Activity   Alcohol use: Yes    Comment: rare   Drug use: No   Sexual activity: Not on file  Other Topics Concern   Not on file  Social History Narrative   Lives with wife Janet   Right Handed   Drinks 1-2 cups caffeine   Social Determinants of Health   Financial Resource Strain: Medium Risk (06/08/2021)   Overall Financial Resource Strain (CARDIA)    Difficulty of Paying Living Expenses: Somewhat hard  Food Insecurity: Food Insecurity Present (01/23/2023)   Hunger Vital Sign    Worried About Running Out of Food in the Last Year: Sometimes true    Ran Out of Food in the Last Year: Sometimes true  Transportation Needs: No Transportation Needs (01/23/2023)   PRAPARE - Transportation    Lack of Transportation (Medical): No    Lack of Transportation (Non-Medical): No  Physical Activity: Not on file  Stress: Not on file  Social Connections: Not on file  Intimate Partner Violence: Unknown (01/23/2023)   Humiliation, Afraid, Rape, and Kick questionnaire    Fear of Current or Ex-Partner: No    Emotionally Abused: No    Physically Abused: Patient unable to answer    Sexually Abused: No    Prior to Admission medications   Medication Sig Start Date End Date Taking? Authorizing Provider  acetaminophen (TYLENOL) 650 MG CR tablet Take 1,300 mg by mouth every 8 (eight) hours as needed  for pain.   Yes [provider]  albuterol (VENTOLIN HFA) 108 (90 Base) MCG/ACT inhaler Inhale 2 puffs into the lungs every 6 (six) hours as needed for wheezing or shortness of breath. 08/18/22  Yes Hunsucker, Matthew R, MD  allopurinol (ZYLOPRIM) 100 MG tablet Take 200 mg by mouth 2 (two) times daily.   Yes [provider]  ARIPiprazole (ABILIFY) 5 MG tablet TAKE ONE TABLET BY MOUTH EVERYDAY AT BEDTIME Patient taking differently: Take 5 mg by mouth at bedtime. 03/09/22  Yes Mozingo, Regina Nattalie, NP  atorvastatin (LIPITOR) 40 MG tablet Take 1 tablet (40 mg total) by mouth daily. 10/12/22  Yes Conte, Tessa N, PA-C  busPIRone (BUSPAR) 10 MG tablet Take 1 tablet (10 mg total) by mouth 3 (three) times daily. 03/09/22  Yes Mozingo, Regina Nattalie, NP  cephALEXin (KEFLEX) 500 MG capsule Take 1 capsule (500 mg total) by mouth 4 (four) times daily. 01/22/23  Yes Schuman, James T, PA-C  cetirizine (ZYRTEC) 10 MG tablet Take 10 mg by mouth at bedtime.    Yes [provider]  Cholecalciferol (VITAMIN D3 PO) Take 2,000 Units by mouth daily.   Yes [provider]  dapagliflozin propanediol (FARXIGA) 10 MG TABS tablet TAKE ONE TABLET BY MOUTH BEFORE BREAKFAST Patient taking differently: Take 10 mg by mouth daily with breakfast. 08/05/22  Yes Kumar, Ajay, MD  diltiazem (CARDIZEM CD) 120 MG 24 hr capsule TAKE ONE CAPSULE BY MOUTH ONCE DAILY 12/27/22  Yes [name], Lawrence S, MD  ELIQUIS 5 MG TABS tablet TAKE ONE TABLET BY MOUTH TWICE DAILY Patient taking differently: Take 5 mg by mouth 2 (two) times daily. 08/05/22  Yes [name], Lawrence S, MD  fenofibrate (TRICOR) 145 MG tablet Take 1 tablet (145 mg total) by mouth daily. 10/12/22  Yes Conte, Tessa N, PA-C  ferrous sulfate 324 (65 Fe) MG TBEC Take 324 mg by mouth daily with breakfast.    Yes [provider]  fluticasone (FLONASE) 50 MCG/ACT nasal spray Place 2 sprays into both nostrils daily.      Yes [provider]   Fluticasone-Umeclidin-Vilant (TRELEGY ELLIPTA) 200-62.5-25 MCG/ACT AEPB INHALE 1 PUFF BY MOUTH INTO LUNGS ONCE DAILY Patient taking differently: Inhale 1 puff into the lungs daily. 08/18/22  Yes Hunsucker, Matthew R, MD  insulin aspart (NOVOLOG FLEXPEN) 100 UNIT/ML FlexPen INJECT 12 UNITS BEFORE MEALS, keep sugar TWO HOURS AFTER meals UNDER 180 AT least Patient taking differently: Inject 12 Units into the skin 3 (three) times daily with meals. 01/23/23  Yes Motwani, Komal, MD  Lacosamide 150 MG TABS Take 1 tablet (150 mg total) by mouth 2 (two) times daily. 10/11/22  Yes Camara, Amadou, MD  levETIRAcetam (KEPPRA) 500 MG tablet Take 1 tablet (500 mg total) by mouth 2 (two) times daily. 10/11/22  Yes Camara, Amadou, MD  losartan (COZAAR) 25 MG tablet TAKE ONE TABLET BY MOUTH ONCE DAILY 12/28/22  Yes [name], Lawrence S, MD  metFORMIN (GLUCOPHAGE-XR) 500 MG 24 hr tablet Take 4 tablets (2,000 mg total) by mouth daily. 06/16/21  Yes Ellison, Sean, MD  metolazone (ZAROXOLYN) 2.5 MG tablet Take 1 tablet (2.5 mg total) by mouth once a week. Every Tuesday 12/05/22 03/05/23 Yes [name], Lawrence S, MD  metoprolol succinate (TOPROL-XL) 25 MG 24 hr tablet Take 1 tablet (25 mg total) by mouth daily. 09/08/22  Yes [name], Lawrence S, MD  mirtazapine (REMERON) 15 MG tablet TAKE ONE TABLET BY MOUTH EVERYDAY AT BEDTIME Patient taking differently: Take 15 mg by mouth at bedtime. 12/28/22  Yes Mozingo, Regina Nattalie, NP  omeprazole (PRILOSEC) 20 MG capsule Take 20 mg by mouth every morning. 05/29/20  Yes [provider]  potassium chloride SA (KLOR-CON M) 20 MEQ tablet Take 2 tablets (40 mEq total) by mouth 2 (two) times daily. Except Tuesdays when you take 60mEq ( 3 Tabs) 12/05/22  Yes [name], Lawrence S, MD  prazosin (MINIPRESS) 2 MG capsule Take two tablets at bedtime. Patient taking differently: Take 2 mg by mouth at bedtime. 03/09/22  Yes Mozingo, Regina Nattalie, NP  pregabalin (LYRICA) 150 MG capsule Take 1 capsule (150 mg  total) by mouth 3 (three) times daily. 10/11/22  Yes Camara, Amadou, MD  repaglinide (PRANDIN) 2 MG tablet TAKE TWO TABLETS BY MOUTH TWICE DAILY BEFORE A meal Patient taking differently: Take 2 mg by mouth 2 (two) times daily before a meal. 10/04/22  Yes Kumar, Ajay, MD  spironolactone (ALDACTONE) 25 MG tablet Take 0.5 tablets (12.5 mg total) by mouth daily. 11/16/22  Yes [name], Lawrence R, MD  tirzepatide (MOUNJARO) 12.5 MG/0.5ML Pen Inject 12.5 mg into the skin once a week. 01/19/23  Yes Motwani, Komal, MD  torsemide (DEMADEX) 20 MG tablet Take 5 tablets (100 mg total) by mouth 2 (two) times daily. 12/29/22  Yes Clegg, Amy D, NP  traZODone (DESYREL) 100 MG tablet Take 200 mg by mouth at bedtime. 10/18/22  Yes [provider]  VASCEPA 1 g capsule TAKE TWO CAPSULES BY MOUTH TWICE DAILY 08/05/22  Yes [name], Lawrence S, MD  venlafaxine XR (EFFEXOR-XR) 75 MG 24 hr capsule Take 3 capsules (225 mg total) by mouth daily with breakfast. 03/09/22  Yes Mozingo, Regina Nattalie, NP  diclofenac Sodium (VOLTAREN) 1 % GEL Apply 4 g topically 4 (four) times daily. To right knee. Patient not taking: Reported on 01/23/2023 10/31/22   Arrien, Mauricio Daniel, MD  Insulin Pen Needle (B-D ULTRAFINE III SHORT PEN) 31G X 8 MM MISC Inject 1 each into the skin 3 (three) times daily before meals. Use as instructed 4X daily. 12/06/22   Kumar, Ajay,   MD  TRESIBA FLEXTOUCH 100 UNIT/ML FlexTouch Pen INJECT 40 UNITS into THE SKIN ONCE DAILY 01/27/23   Motwani, Komal, MD    Current Facility-Administered Medications  Medication Dose Route Frequency Provider Last Rate Last Admin   acetaminophen (TYLENOL) tablet 650 mg  650 mg Oral Q6H PRN Howerter, Justin B, DO   650 mg at 02/01/23 1638   Or   acetaminophen (TYLENOL) suppository 650 mg  650 mg Rectal Q6H PRN Howerter, Justin B, DO       allopurinol (ZYLOPRIM) tablet 200 mg  200 mg Oral BID Smith, Rondell A, MD   200 mg at 02/01/23 2243   ARIPiprazole (ABILIFY) tablet 5 mg  5 mg Oral  QHS Smith, Rondell A, MD   5 mg at 02/01/23 2244   atorvastatin (LIPITOR) tablet 40 mg  40 mg Oral Daily Smith, Rondell A, MD   40 mg at 02/01/23 0826   busPIRone (BUSPAR) tablet 10 mg  10 mg Oral TID Smith, Rondell A, MD   10 mg at 02/01/23 2243   dapagliflozin propanediol (FARXIGA) tablet 10 mg  10 mg Oral Q breakfast Smith, Rondell A, MD   10 mg at 02/01/23 0826   diclofenac Sodium (VOLTAREN) 1 % topical gel 4 g  4 g Topical QID Lancaster, William C, MD   4 g at 02/01/23 2245   diltiazem (CARDIZEM CD) 24 hr capsule 120 mg  120 mg Oral Daily Smith, Rondell A, MD   120 mg at 02/01/23 0827   fenofibrate tablet 160 mg  160 mg Oral Daily Smith, Rondell A, MD   160 mg at 02/01/23 0825   fluticasone furoate-vilanterol (BREO ELLIPTA) 200-25 MCG/ACT 1 puff  1 puff Inhalation Daily Smith, Rondell A, MD   1 puff at 02/01/23 0919   And   umeclidinium bromide (INCRUSE ELLIPTA) 62.5 MCG/ACT 1 puff  1 puff Inhalation Daily Smith, Rondell A, MD   1 puff at 02/01/23 0919   guaiFENesin (ROBITUSSIN) 100 MG/5ML liquid 5 mL  5 mL Oral Q4H PRN Amin, Ankit Chirag, MD       hydrALAZINE (APRESOLINE) injection 10 mg  10 mg Intravenous Q4H PRN Amin, Ankit Chirag, MD       insulin aspart (novoLOG) injection 0-15 Units  0-15 Units Subcutaneous TID WC Smith, Rondell A, MD   2 Units at 02/01/23 1646   insulin aspart (novoLOG) injection 0-5 Units  0-5 Units Subcutaneous QHS Smith, Rondell A, MD   2 Units at 01/25/23 2229   insulin glargine-yfgn (SEMGLEE) injection 30 Units  30 Units Subcutaneous Daily Dunn, Dayna N, PA-C   30 Units at 02/01/23 0825   ipratropium-albuterol (DUONEB) 0.5-2.5 (3) MG/3ML nebulizer solution 3 mL  3 mL Nebulization Q4H PRN Amin, Ankit Chirag, MD       lacosamide (VIMPAT) tablet 150 mg  150 mg Oral BID Smith, Rondell A, MD   150 mg at 02/01/23 2243   levETIRAcetam (KEPPRA) tablet 500 mg  500 mg Oral BID Smith, Rondell A, MD   500 mg at 02/01/23 2242   lidocaine (LIDODERM) 5 % 1 patch  1 patch  Transdermal Q24H Lancaster, William C, MD   1 patch at 02/01/23 2047   melatonin tablet 3 mg  3 mg Oral QHS PRN Howerter, Justin B, DO       metoprolol succinate (TOPROL-XL) 24 hr tablet 50 mg  50 mg Oral Daily Goodrich, Callie E, PA-C   50 mg at 02/01/23 0827   mirtazapine (REMERON) tablet 15 mg    15 mg Oral QHS Smith, Rondell A, MD   15 mg at 02/01/23 2244   morphine (PF) 2 MG/ML injection 2 mg  2 mg Intravenous Q4H PRN Dunn, Dayna N, PA-C   2 mg at 02/01/23 2102   ondansetron (ZOFRAN) injection 4 mg  4 mg Intravenous Q6H PRN Howerter, Justin B, DO       pantoprazole (PROTONIX) EC tablet 40 mg  40 mg Oral BID Dunn, Dayna N, PA-C   40 mg at 02/01/23 2242   potassium chloride SA (KLOR-CON M) CR tablet 40 mEq  40 mEq Oral Q4H Dunn, Dayna N, PA-C       prazosin (MINIPRESS) capsule 2 mg  2 mg Oral QHS Smith, Rondell A, MD   2 mg at 02/01/23 2244   pregabalin (LYRICA) capsule 150 mg  150 mg Oral TID Smith, Rondell A, MD   150 mg at 02/01/23 2242   senna-docusate (Senokot-S) tablet 1 tablet  1 tablet Oral QHS PRN Amin, Ankit Chirag, MD       sodium chloride flush (NS) 0.9 % injection 3 mL  3 mL Intravenous Q12H Smith, Rondell A, MD   3 mL at 02/01/23 2245   sodium chloride flush (NS) 0.9 % injection 3 mL  3 mL Intravenous Q12H Goodrich, Callie E, PA-C   3 mL at 02/01/23 2246   spironolactone (ALDACTONE) tablet 12.5 mg  12.5 mg Oral Daily Smith, Rondell A, MD   12.5 mg at 02/01/23 0827   torsemide (DEMADEX) tablet 20 mg  20 mg Oral Daily Christopher, Bridgette, MD   20 mg at 02/01/23 0827   traZODone (DESYREL) tablet 200 mg  200 mg Oral QHS Smith, Rondell A, MD   200 mg at 02/01/23 2241   venlafaxine XR (EFFEXOR-XR) 24 hr capsule 225 mg  225 mg Oral Q breakfast Smith, Rondell A, MD   225 mg at 02/01/23 0826    Allergies  Allergen Reactions   Vancomycin Other (See Comments)    "Red Man Syndrome" 02/02/17: possible cause for rash under both arms   Niacin And Related Other (See Comments)    Red man  syndrome   Tubersol [Tuberculin, Ppd] Other (See Comments)    Reaction unknown   Doxycycline Rash and Other (See Comments)      Review of Systems:   General:  normal appetite, + decreased energy, +  weight gain, no weight loss, no fever  Cardiac:  + chest pain with exertion, no chest pain at rest, +SOB with mild exertion, + resting SOB, no PND, + orthopnea, no palpitations, + arrhythmia, + atrial fibrillation, + LE edema, + dizzy spells, no syncope  Respiratory:  + shortness of breath, no home oxygen, no productive cough, no dry cough, no bronchitis, no wheezing, no hemoptysis, no asthma, no pain with inspiration or cough, + sleep apnea, + CPAP at night  GI:   no difficulty swallowing, no reflux, no frequent heartburn, no hiatal hernia, no abdominal pain, no constipation, no diarrhea, no hematochezia, no hematemesis, no melena  GU:   no dysuria,  no frequency, no urinary tract infection, no hematuria, no enlarged prostate, no kidney stones, no kidney disease  Vascular:  no pain suggestive of claudication, no pain in feet, no leg cramps, no varicose veins, no DVT, no non-healing foot ulcer  Neuro:   no stroke, no TIA's, no seizures, no headaches, no temporary blindness one eye,  no slurred speech, no peripheral neuropathy, no chronic pain, + instability of gait, no memory/cognitive   dysfunction  Musculoskeletal: + arthritis - primarily involving the knees and back, no joint swelling, no myalgias, + difficulty walking, + decreased mobility   Skin:   no rash, no itching, no skin infections, no pressure sores or ulcerations  Psych:   no anxiety, no depression, no nervousness, no unusual recent stress  Eyes:   no blurry vision, no floaters, no recent vision changes, + wears glasses   ENT:   no hearing loss, no loose or painful teeth, no dentures, last saw dentist many years ago  Hematologic:  no easy bruising, no abnormal bleeding, no clotting disorder, no frequent epistaxis  Endocrine:  + diabetes,  does check CBG's at home     Physical Exam:   BP 122/68 (BP Location: Right Arm)   Pulse 69   Temp 98.3 F (36.8 C) (Oral)   Resp 19   Ht 6' 2" (1.88 m)   Wt (!) 162.8 kg   SpO2 93%   BMI 46.09 kg/m   General:  Morbidly obese, looks comfortable lying in bed  HEENT:  Unremarkable, NCAT, PERLA, EOMI, poor dentition  Neck:   no JVD, no bruits, no adenopathy   Chest:   clear to auscultation, symmetrical breath sounds, no wheezes, no rhonchi   CV:   RRR, 2/6 systolic murmur RSB, no diastolic murmur  Abdomen:  soft, obese, non-tender, no masses, groins without rash  Extremities:  warm, well-perfused, pulses not palpable at ankles, moderate lower extremity edema  Rectal/GU  Deferred  Neuro:   Grossly non-focal and symmetrical throughout  Skin:   Clean and dry, no rashes, no breakdown  Diagnostic Tests:  Lab Results: Recent Labs    01/31/23 0147 02/02/23 0249  WBC 10.1 11.6*  HGB 12.6* 13.6  HCT 39.6 41.9  PLT 252 263   BMET:  Recent Labs    02/01/23 1401 02/02/23 0249  NA 134* 136  K 3.5 3.3*  CL 96* 98  CO2 26 29  GLUCOSE 175* 120*  BUN 20 17  CREATININE 0.97 0.86  CALCIUM 8.9 8.7*    CBG (last 3)  Recent Labs    02/01/23 1201 02/01/23 1635 02/01/23 2117  GLUCAP 157* 129* 172*     ECHOCARDIOGRAM REPORT       Patient Name:   Lawrence [name] Date of Exam: 12/22/2022  Medical Rec #:  [id number]        Height:       74.0 in  Accession #:    [id number]       Weight:       360.0 lb  Date of Birth:  07/08/1956        BSA:          2.790 m  Patient Age:    66 years         BP:           100/60 mmHg  Patient Gender: M                HR:           89 bpm.  Exam Location:  Outpatient   Procedure: 2D Echo, Color Doppler and Cardiac Doppler   Indications:    I35.0 Aortic Stenosis    History:        Patient has prior history of Echocardiogram examinations.  Asthma                 and COPD, Aortic Stenosis; Risk Factors:Diabetes,  Hypertension                    and Sleep Apnea.    Sonographer:    L. Thornton-Maynard  Referring Phys: [id number] Lawrence S [name]   IMPRESSIONS     1. Left ventricular ejection fraction, by estimation, is 60 to 65%. The  left ventricle has normal function. The left ventricle has no regional  wall motion abnormalities. There is mild left ventricular hypertrophy.  Left ventricular diastolic parameters  were normal.   2. Right ventricular systolic function is normal. The right ventricular  size is normal.   3. Left atrial size was moderately dilated.   4. The mitral valve is abnormal. No evidence of mitral valve  regurgitation. No evidence of mitral stenosis.   5. AS has progressed since TTE done 05/2022. The aortic valve was not  well visualized. There is moderate calcification of the aortic valve.  There is moderate thickening of the aortic valve. Aortic valve  regurgitation is not visualized. Severe aortic  valve stenosis.   6. Aortic dilatation noted. There is mild dilatation of the aortic root,  measuring 38 mm. There is mild dilatation of the ascending aorta,  measuring 40 mm.   7. The inferior vena cava is normal in size with greater than 50%  respiratory variability, suggesting right atrial pressure of 3 mmHg.   FINDINGS   Left Ventricle: Left ventricular ejection fraction, by estimation, is 60  to 65%. The left ventricle has normal function. The left ventricle has no  regional wall motion abnormalities. The left ventricular internal cavity  size was normal in size. There is   mild left ventricular hypertrophy. Left ventricular diastolic parameters  were normal.   Right Ventricle: The right ventricular size is normal. No increase in  right ventricular wall thickness. Right ventricular systolic function is  normal.   Left Atrium: Left atrial size was moderately dilated.   Right Atrium: Right atrial size was normal in size.   Pericardium: There is no evidence of pericardial effusion.   Mitral Valve:  The mitral valve is abnormal. There is mild thickening of  the mitral valve leaflet(s). There is mild calcification of the mitral  valve leaflet(s). Mild mitral annular calcification. No evidence of mitral  valve regurgitation. No evidence of  mitral valve stenosis. MV peak gradient, 3.5 mmHg. The mean mitral valve  gradient is 3.0 mmHg.   Tricuspid Valve: The tricuspid valve is normal in structure. Tricuspid  valve regurgitation is not demonstrated. No evidence of tricuspid  stenosis.   Aortic Valve: AS has progressed since TTE done 05/2022. The aortic valve  was not well visualized. There is moderate calcification of the aortic  valve. There is moderate thickening of the aortic valve. Aortic valve  regurgitation is not visualized. Severe  aortic stenosis is present. Aortic valve mean gradient measures 40.0 mmHg.  Aortic valve peak gradient measures 65.7 mmHg. Aortic valve area, by VTI  measures 1.03 cm.   Pulmonic Valve: The pulmonic valve was normal in structure. Pulmonic valve  regurgitation is not visualized. No evidence of pulmonic stenosis.   Aorta: Aortic dilatation noted. There is mild dilatation of the aortic  root, measuring 38 mm. There is mild dilatation of the ascending aorta,  measuring 40 mm.   Venous: The inferior vena cava is normal in size with greater than 50%  respiratory variability, suggesting right atrial pressure of 3 mmHg.   IAS/Shunts: No atrial level shunt detected by color flow Doppler.     LEFT VENTRICLE  PLAX 2D  LVIDd:         6.20   cm     Diastology  LVIDs:         4.90 cm     LV e' medial:    5.66 cm/s  LV PW:         1.20 cm     LV E/e' medial:  16.3  LV IVS:        1.30 cm     LV e' lateral:   7.62 cm/s  LVOT diam:     2.10 cm     LV E/e' lateral: 12.1  LV SV:         84  LV SV Index:   30  LVOT Area:     3.46 cm    LV Volumes (MOD)  LV vol d, MOD A2C: 60.4 ml  LV vol d, MOD A4C: 89.7 ml  LV vol s, MOD A2C: 17.0 ml  LV vol s, MOD  A4C: 22.3 ml  LV SV MOD A2C:     43.4 ml  LV SV MOD A4C:     89.7 ml  LV SV MOD BP:      54.7 ml   RIGHT VENTRICLE             IVC  RV S prime:     12.20 cm/s  IVC diam: 2.20 cm  TAPSE (M-mode): 3.0 cm   LEFT ATRIUM             Index  LA diam:        4.30 cm 1.54 cm/m  LA Vol (A2C):   28.9 ml 10.36 ml/m  LA Vol (A4C):   80.6 ml 28.89 ml/m  LA Biplane Vol: 50.4 ml 18.06 ml/m   AORTIC VALVE                     PULMONIC VALVE  AV Area (Vmax):    1.03 cm      PV Vmax:       1.12 m/s  AV Area (Vmean):   1.03 cm      PV Peak grad:  5.0 mmHg  AV Area (VTI):     1.03 cm  AV Vmax:           405.33 cm/s  AV Vmean:          299.667 cm/s  AV VTI:            0.817 m  AV Peak Grad:      65.7 mmHg  AV Mean Grad:      40.0 mmHg  LVOT Vmax:         121.00 cm/s  LVOT Vmean:        88.900 cm/s  LVOT VTI:          0.243 m  LVOT/AV VTI ratio: 0.30    AORTA  Ao Root diam: 4.00 cm  Ao Asc diam:  4.00 cm  Ao Arch diam: 4.5 cm   MITRAL VALVE  MV Area (PHT): 2.56 cm    SHUNTS  MV Area VTI:   3.55 cm    Systemic VTI:  0.24 m  MV Peak grad:  3.5 mmHg    Systemic Diam: 2.10 cm  MV Mean grad:  3.0 mmHg  MV Vmax:       0.93 m/s  MV Vmean:      77.6 cm/s  MV Decel Time: 296 msec  MV E velocity: 92.20 cm/s  MV A velocity: 80.70 cm/s  MV E/A ratio:  1.14     Peter Nishan MD  Electronically signed by Peter Nishan MD  Signature Date/Time: 12/22/2022/11:30:25 AM        Final      Physicians  Panel Physicians Referring Physician Case Authorizing Physician  Thukkani, Arun K, MD (Primary)     Procedures  RIGHT HEART CATH AND CORONARY ANGIOGRAPHY   Conclusion      Prox LAD to Mid LAD lesion is 40% stenosed.   Prox Cx to Mid Cx lesion is 20% stenosed.   Ost 2nd Mrg lesion is 20% stenosed.   Mid RCA lesion is 40% stenosed.   Prox Cx lesion is 20% stenosed.   1.  Mild to moderate diffuse obstructive coronary artery disease without high-grade obstructions. 2.  Fick cardiac output of  9.7 L/min and Fick cardiac index of 3.5 L/min/m with the following hemodynamics:            Right atrial pressure mean of 9 mmHg            Right ventricular pressure 36/4 with an end-diastolic pressure of 16 mmHg            Mean wedge pressure of 14 mmHg            Pulmonary artery pressure of 34/15 with a mean of 24 mmHg   Recommendation: Continue evaluation for aortic valve intervention.   Indications  Acute on chronic diastolic heart failure (HCC) [I50.33 (ICD-10-CM)]   Procedural Details  Technical Details The patient is a 66-year-old male with a history of severe aortic stenosis, paroxysmal atrial fibrillation on Eliquis, chronic diastolic heart failure, obstructive sleep apnea, elevated BMI of 46, COPD, and seizure disorder who was admitted with acute on chronic diastolic heart failure.  He was diuresed close to 10 L.  He is referred for right heart catheterization and coronary angiography study as a preprocedural assessment prior to aortic valve intervention.  After obtaining consent the patient brought to the cardiac catheterization laboratory and prepped draped sterile fashion.  Previous placed antecubital IV was exchanged for a 5 French Terumo glide sheath.  Xylocaine was used to anesthetize the right radial site and a 6 French Terumo glide sheath was placed.  5000 units heparin 5 mg verapamil were administered through the sheath.  A 6 French Tig catheter was used for selective coronary angiography of the left system and a 6 French JR4 diagnostic catheter for selective coronary  very angiography of the right system.  A 5 French balloontipped catheter was used for right heart catheterization.  After review of the angiogram, no interventions were pursued.  Manual pressure was applied to the antecubital site and a TR band was placed.  There were no acute complications. Estimated blood loss <50 mL.   During this procedure medications were administered to achieve and maintain moderate  conscious sedation while the patient's heart rate, blood pressure, and oxygen saturation were continuously monitored and I was present face-to-face 100% of this time.   Medications (Filter: Administrations occurring from 1108 to 1223 on 01/26/23) midazolam (VERSED) injection (mg)  Total dose: 2 mg Date/Time Rate/Dose/Volume Action   01/26/23 1137 2 mg Given   fentaNYL (SUBLIMAZE) injection (mcg)  Total dose: 50 mcg Date/Time Rate/Dose/Volume Action   01/26/23 1137 50 mcg Given   lidocaine (PF) (XYLOCAINE) 1 % injection (mL)  Total volume: 5 mL Date/Time Rate/Dose/Volume Action   01/26/23 1141 5 mL Given   Radial Cocktail/Verapamil only (mL)  Total volume: 10 mL Date/Time Rate/Dose/Volume Action   01/26/23 1143 10 mL   Given   Heparin (Porcine) in NaCl 1000-0.9 UT/500ML-% SOLN (mL)  Total volume: 1,000 mL Date/Time Rate/Dose/Volume Action   01/26/23 1143 500 mL Given   1144 500 mL Given   heparin sodium (porcine) injection (Units)  Total dose: 5,000 Units Date/Time Rate/Dose/Volume Action   01/26/23 1143 5,000 Units Given   iohexol (OMNIPAQUE) 350 MG/ML injection (mL)  Total volume: 45 mL Date/Time Rate/Dose/Volume Action   01/26/23 1211 45 mL Given   allopurinol (ZYLOPRIM) tablet 200 mg (mg)  Total dose: Cannot be calculated* Dosing weight: 163.3 *Administration dose not documented Date/Time Rate/Dose/Volume Action   01/26/23 1108 *Not included in total MAR Hold   ARIPiprazole (ABILIFY) tablet 5 mg (mg)  Total dose: Cannot be calculated* *Administration dose not documented Date/Time Rate/Dose/Volume Action   01/26/23 1108 *Not included in total MAR Hold   atorvastatin (LIPITOR) tablet 40 mg (mg)  Total dose: Cannot be calculated* Dosing weight: 163.3 *Administration dose not documented Date/Time Rate/Dose/Volume Action   01/26/23 1108 *Not included in total MAR Hold   busPIRone (BUSPAR) tablet 10 mg (mg)  Total dose: Cannot be calculated* *Administration dose not  documented Date/Time Rate/Dose/Volume Action   01/26/23 1108 *Not included in total MAR Hold   dapagliflozin propanediol (FARXIGA) tablet 10 mg (mg)  Total dose: Cannot be calculated* *Administration dose not documented Date/Time Rate/Dose/Volume Action   01/26/23 1108 *Not included in total MAR Hold   diltiazem (CARDIZEM CD) 24 hr capsule 120 mg (mg)  Total dose: Cannot be calculated* Dosing weight: 163.3 *Administration dose not documented Date/Time Rate/Dose/Volume Action   01/26/23 1108 *Not included in total MAR Hold   fenofibrate tablet 160 mg (mg)  Total dose: Cannot be calculated* Dosing weight: 163.3 *Administration dose not documented Date/Time Rate/Dose/Volume Action   01/26/23 1108 *Not included in total MAR Hold   insulin aspart (novoLOG) injection 0-15 Units (Units)  Total dose: Cannot be calculated* Dosing weight: 163.3 *Administration dose not documented Date/Time Rate/Dose/Volume Action   01/26/23 1108 *Not included in total MAR Hold   1200 *Not included in total Automatically Held   insulin aspart (novoLOG) injection 0-5 Units (Units)  Total dose: Cannot be calculated* Dosing weight: 163.3 *Administration dose not documented Date/Time Rate/Dose/Volume Action   01/26/23 1108 *Not included in total MAR Hold   lacosamide (VIMPAT) tablet 150 mg (mg)  Total dose: Cannot be calculated* *Administration dose not documented Date/Time Rate/Dose/Volume Action   01/26/23 1108 *Not included in total MAR Hold   levETIRAcetam (KEPPRA) tablet 500 mg (mg)  Total dose: Cannot be calculated* *Administration dose not documented Date/Time Rate/Dose/Volume Action   01/26/23 1108 *Not included in total MAR Hold   melatonin tablet 3 mg (mg)  Total dose: Cannot be calculated* Dosing weight: 163.3 *Administration dose not documented Date/Time Rate/Dose/Volume Action   01/26/23 1108 *Not included in total MAR Hold   metoprolol succinate (TOPROL-XL) 24 hr tablet 50 mg (mg)  Total  dose: Cannot be calculated* Dosing weight: 164.2 *Administration dose not documented Date/Time Rate/Dose/Volume Action   01/26/23 1108 *Not included in total MAR Hold   mirtazapine (REMERON) tablet 15 mg (mg)  Total dose: Cannot be calculated* *Administration dose not documented Date/Time Rate/Dose/Volume Action   01/26/23 1108 *Not included in total MAR Hold   ondansetron (ZOFRAN) injection 4 mg (mg)  Total dose: Cannot be calculated* Dosing weight: 163.3 *Administration dose not documented Date/Time Rate/Dose/Volume Action   01/26/23 1108 *Not included in total MAR Hold   prazosin (MINIPRESS) capsule 2 mg (mg)  Total dose: Cannot be   calculated* *Administration dose not documented Date/Time Rate/Dose/Volume Action   01/26/23 1108 *Not included in total MAR Hold   pregabalin (LYRICA) capsule 150 mg (mg)  Total dose: Cannot be calculated* *Administration dose not documented Date/Time Rate/Dose/Volume Action   01/26/23 1108 *Not included in total MAR Hold   sodium chloride flush (NS) 0.9 % injection 3 mL (mL)  Total dose: Cannot be calculated* Dosing weight: 163.3 *Administration dose not documented Date/Time Rate/Dose/Volume Action   01/26/23 1108 *Not included in total MAR Hold   sodium chloride flush (NS) 0.9 % injection 3 mL (mL)  Total dose: Cannot be calculated* Dosing weight: 164.2 *Administration dose not documented Date/Time Rate/Dose/Volume Action   01/26/23 1108 *Not included in total MAR Hold   spironolactone (ALDACTONE) tablet 12.5 mg (mg)  Total dose: Cannot be calculated* Dosing weight: 163.3 *Administration dose not documented Date/Time Rate/Dose/Volume Action   01/26/23 1108 *Not included in total MAR Hold   traZODone (DESYREL) tablet 200 mg (mg)  Total dose: Cannot be calculated* Dosing weight: 163.3 *Administration dose not documented Date/Time Rate/Dose/Volume Action   01/26/23 1108 *Not included in total MAR Hold   venlafaxine XR (EFFEXOR-XR) 24 hr  capsule 225 mg (mg)  Total dose: Cannot be calculated* *Administration dose not documented Date/Time Rate/Dose/Volume Action   01/26/23 1108 *Not included in total MAR Hold   oxyCODONE (Oxy IR/ROXICODONE) immediate release tablet 5 mg (mg)  Total dose: Cannot be calculated* Dosing weight: 164.2 *Administration dose not documented Date/Time Rate/Dose/Volume Action   01/26/23 1108 *Not included in total MAR Hold   potassium chloride SA (KLOR-CON M) CR tablet 40 mEq (mEq)  Total dose: Cannot be calculated* Dosing weight: 164.2 *Administration dose not documented Date/Time Rate/Dose/Volume Action   01/26/23 1108 *Not included in total MAR Hold   albuterol (PROVENTIL) (2.5 MG/3ML) 0.083% nebulizer solution 2.5 mg (mg)  Total dose: Cannot be calculated* Dosing weight: 163.3 *Administration dose not documented Date/Time Rate/Dose/Volume Action   01/26/23 1108 *Not included in total MAR Hold   metolazone (ZAROXOLYN) tablet 2.5 mg (mg)  Total dose: Cannot be calculated* Dosing weight: 163.3 *Administration dose not documented Date/Time Rate/Dose/Volume Action   01/26/23 1108 *Not included in total MAR Hold    Sedation Time  Sedation Time Physician-1: 30 minutes 47 seconds Contrast     Administrations occurring from 1108 to 1223 on 01/26/23:  Medication Name Total Dose  iohexol (OMNIPAQUE) 350 MG/ML injection 45 mL   Radiation/Fluoro  Fluoro time: 5.7 (min) DAP: 32735 (mGycm2) Cumulative Air Kerma: 504 (mGy) Complications  Complications documented before study signed (01/26/2023 12:23 PM)   No complications were associated with this study.  Documented by Barrios, Yara J, RN - 01/26/2023 12:10 PM     Coronary Findings  Diagnostic Dominance: Right Left Main  Vessel is normal in caliber and large. Vessel is angiographically normal.    Left Anterior Descending  Vessel is normal in caliber. Moderate to Large Caliber There is mild diffuse disease throughout the vessel.  Prox  LAD to Mid LAD lesion is 40% stenosed. The lesion is focal, tubular and smooth.    First Diagonal Branch  Vessel is moderate in size. Vessel is angiographically normal.    Second Diagonal Branch  Vessel is large in size. Vessel is angiographically normal.    Third Diagonal Branch  Vessel is small in size. Vessel is angiographically normal.    Left Circumflex  Vessel is large. Vessel is angiographically normal. The vessel is moderately ectatic.  Prox Cx lesion is 20% stenosed.    Prox Cx to Mid Cx lesion is 20% stenosed.    First Obtuse Marginal Branch  Vessel is small in size. Vessel is angiographically normal.    Second Obtuse Marginal Branch  Vessel is moderate in size. Vessel is angiographically normal.  Ost 2nd Mrg lesion is 20% stenosed.    Right Coronary Artery  Vessel is normal in caliber and large. Very Large Ectatic vessel (especially in the distal 1/3) There is mild diffuse disease throughout the vessel. The vessel is moderately ectatic.  Mid RCA lesion is 40% stenosed.    Inferior Septal  Vessel is small in size. Vessel is angiographically normal.    Right Posterior Atrioventricular Artery  Vessel is moderate in size. Vessel is angiographically normal.    First Right Posterolateral Branch  Vessel is small in size. Vessel is angiographically normal.    Intervention   No interventions have been documented.   Coronary Diagrams  Diagnostic Dominance: Right  Intervention   Implants   No implant documentation for this case.   Syngo Images   Show images for CARDIAC CATHETERIZATION Images on Long Term Storage   Show images for Lawrence [name], Lawrence A "Ray" Link to Procedure Log  Procedure Log    Hemo Data  Flowsheet Row Most Recent Value  Fick Cardiac Output 9.71 L/min  Fick Cardiac Output Index 3.48 (L/min)/BSA  RA A Wave 10 mmHg  RA V Wave 10 mmHg  RA Mean 9 mmHg  RV Systolic Pressure 36 mmHg  RV Diastolic Pressure 4 mmHg  RV EDP 16 mmHg  PA  Systolic Pressure 34 mmHg  PA Diastolic Pressure 15 mmHg  PA Mean 24 mmHg  PW A Wave 19 mmHg  PW V Wave 19 mmHg  PW Mean 14 mmHg  AO Systolic Pressure 106 mmHg  AO Diastolic Pressure 68 mmHg  AO Mean 87 mmHg  QP/QS 0.87  TPVR Index 7.89 HRUI  TSVR Index 25.03 HRUI  PVR SVR Ratio 0.15  TPVR/TSVR Ratio 0.32   ADDENDUM REPORT: 01/30/2023 14:10   CLINICAL DATA:  66M with severe aortic stenosis being evaluated for a TAVR procedure.   EXAM: Cardiac TAVR CT   TECHNIQUE: The patient was scanned on a Phillips Force scanner. A 120 kV retrospective scan was triggered in the descending thoracic aorta at 111 HU's. Gantry rotation speed was 250 msecs and collimation was .6 mm. No beta blockade or nitro were given. The 3D data set was reconstructed in 5% intervals of the R-R cycle. Systolic and diastolic phases were analyzed on a dedicated work station using MPR, MIP and VRT modes. The patient received 100 cc of contrast.   FINDINGS: Aortic Root:   Aortic valve: Trileaflet   Aortic annulus:   Diameter: 29mm x 27mm   Perimeter: 92mm   Area: 657 mm^2   Calcifications: No calcifications   Coronary height: Min Left - 16mm, Min Right - 17mm   Sinotubular height: Left cusp - 24mm; Right cusp - 27mm; Noncoronary cusp - 29mm   LVOT (as measured 3 mm below the annulus):   Diameter: 30mm x 27mm   Area: 735 mm^2   Calcifications: No calcifications   Aortic sinus width: Left cusp - 37mm; Right cusp - 37mm; Noncoronary cusp - 37mm   Sinotubular junction width: 34mm x 33mm   Optimum Fluoroscopic Angle for Delivery: RAO 5 CRA 10   IMPRESSION: 1. Poor contrast opacification, which affects accuracy of TAVR measurements   2.  Tricuspid aortic valve with severe calcifications   3. Aortic   annulus measures 29mm x 27mm in diameter with perimeter 92mm and area 657 mm^2. No annular or LVOT calcifications. Annular measurements are suitable for placement of 29mm Sapien 3 valve    4. Sufficient coronary to annulus distance.   5. Optimum Fluoroscopic Angle for Delivery:  RAO 5 CRA 10   6. See prior study on 01/27/23 for aortic valve and coronary calcium scores     Electronically Signed   By: Christopher  Schumann M.D.   On: 01/30/2023 14:10    Addended by Schumann, Christopher L, MD on 01/30/2023  2:12 PM    Study Result  Narrative & Impression  EXAM: OVER-READ INTERPRETATION  CT CHEST   The following report is a limited chest CT over-read performed by radiologist Dr. Nicholas Plundo of [facility] on 01/30/2023. This over-read does not include interpretation of cardiac or coronary anatomy or pathology. The coronary CTA interpretation by the cardiologist is attached.   COMPARISON:  01/27/2023   FINDINGS: Stable heart size. No pericardial effusion. Image thoracic aorta is nonaneurysmal. Aortic and coronary artery atherosclerosis. Central pulmonary vasculature within normal limits.   Mildly enlarged subcarinal and bilateral hilar lymph nodes, unchanged. Atelectatic changes within the dependent lung fields. Imaged lung fields are otherwise clear. No acute bony or chest wall abnormality.   IMPRESSION: 1. No new or acute extracardiac findings. 2. Mildly enlarged subcarinal and bilateral hilar lymph nodes, unchanged. 3. Aortic and coronary artery atherosclerosis (ICD10-I70.0).   Electronically Signed: By: Nicholas  Plundo D.O. On: 01/30/2023 13:19       Impression:  This 66-year-old gentleman has stage D, severe, symptomatic aortic stenosis admitted with NYHA class IV symptoms of worsening shortness of breath and chest discomfort with hypoxemic respiratory failure and marked volume overload consistent with acute on chronic diastolic congestive heart failure.  I have personally reviewed his 2D echocardiogram, cardiac catheterization, and CTA studies.  His echocardiogram showed a thickened and calcified aortic valve with restricted  leaflet mobility with a mean gradient of 40 mmHg and a peak gradient of 66 mmHg with a valve area by VTI of 1.03 cm.  Left ventricular ejection fraction was 60 to 65%.  Cardiac catheterization showed mild to moderate diffuse obstructive coronary disease without high-grade stenosis.  I agree that aortic valve replacement is indicated in this patient for relief of his symptoms and to prevent recurrent congestive heart failure and left ventricular deterioration.  Given his morbid obesity and multiple comorbid risk factors I do not think he would be a candidate for open surgical aortic valve replacement.  I think transcatheter aortic valve replacement would be a reasonable alternative.  His gated cardiac CTA shows anatomy suitable for TAVR using a 29 mm SAPIEN 3 valve.  His abdominal and pelvic CTA shows adequate pelvic vascular anatomy to allow transfemoral insertion.  The patient was counseled at length regarding treatment alternatives for management of severe symptomatic aortic stenosis. The risks and benefits of surgical intervention has been discussed in detail. Long-term prognosis with medical therapy was discussed. Alternative approaches such as conventional surgical aortic valve replacement, transcatheter aortic valve replacement, and palliative medical therapy were compared and contrasted at length. This discussion was placed in the context of the patient's own specific clinical presentation and past medical history. All of his questions have been addressed.   Following the decision to proceed with transcatheter aortic valve replacement, a discussion was held regarding what types of management strategies would be attempted intraoperatively in the event of life-threatening complications, including whether or not   the patient would be considered a candidate for the use of cardiopulmonary bypass and/or conversion to open sternotomy for attempted surgical intervention.  I do not think he is a candidate for  median sternotomy to manage any intraoperative complications.  The patient is aware of the fact that transient use of cardiopulmonary bypass may be necessary. The patient has been advised of a variety of complications that might develop including but not limited to risks of death, stroke, paravalvular leak, aortic dissection or other major vascular complications, aortic annulus rupture, device embolization, cardiac rupture or perforation, mitral regurgitation, acute myocardial infarction, arrhythmia, heart block or bradycardia requiring permanent pacemaker placement, congestive heart failure, respiratory failure, renal failure, pneumonia, infection, other late complications related to structural valve deterioration or migration, or other complications that might ultimately cause a temporary or permanent loss of functional independence or other long term morbidity. The patient provides full informed consent for the procedure as described and all questions were answered.      Plan:  He has been seen by Dr. Jensen and Lawrence undergo dental extractions on Friday this week.  He Lawrence tentatively be scheduled for TAVR on Tuesday, 02/07/2023.   Evelyne Makepeace K. Brea Coleson, MD 02/02/2023 7:35 AM   

## 2023-02-02 NOTE — Progress Notes (Signed)
Occupational Therapy Treatment Patient Details Name: Lawrence [name] MRN: [id number] DOB: 1956-05-12 Today's Date: 02/02/2023   History of present illness Pt is a 67 y/o male who presents 01/22/23 with SOB and intermittent L sided CP for ~2 weeks PTA. R/L heart cath scheduled for 6/20 while inpatient. S/P heart cath 6/20. PMH significant for HTN, CHF, PAF on Eliquis, aortic stenosis, CAD, CVA, DM II, COPD, AAA.   OT comments  Pt making good progress with functional goals. O2 SATs remained >90% on 4L throughout session. Pt educated on energy conservation strategies with handouts provided and pt reports use of energy conservation and activity pacing techniques at home. OT will continue to follow acutely to maximize level of function and safety   Recommendations for follow up therapy are one component of a multi-disciplinary discharge planning process, led by the attending physician.  Recommendations may be updated based on patient status, additional functional criteria and insurance authorization.    Assistance Recommended at Discharge Intermittent Supervision/Assistance  Patient can return home with the following  A lot of help with bathing/dressing/bathroom;Assistance with cooking/housework;Direct supervision/assist for medications management;Direct supervision/assist for financial management;Assist for transportation;Help with stairs or ramp for entrance;A lot of help with walking and/or transfers   Equipment Recommendations  None recommended by OT    Recommendations for Other Services      Precautions / Restrictions Precautions Precautions: Fall Precaution Comments: monitor HR and SATs Restrictions Weight Bearing Restrictions: No       Mobility Bed Mobility               General bed mobility comments: pt seated in recliner    Transfers Overall transfer level: Needs assistance Equipment used: Rolling walker (2 wheels) Transfers: Sit to/from Stand Sit to Stand: From  elevated surface, Min assist           General transfer comment: Min A for power up and anterior weight shift after pt with unsuccessful attempt.     Balance Overall balance assessment: Needs assistance Sitting-balance support: No upper extremity supported, Feet supported Sitting balance-Leahy Scale: Good     Standing balance support: Bilateral upper extremity supported, Reliant on assistive device for balance, During functional activity Standing balance-Leahy Scale: Poor                             ADL either performed or assessed with clinical judgement   ADL Overall ADL's : Needs assistance/impaired     Grooming: Wash/dry hands;Wash/dry face;Min guard;Standing           Upper Body Dressing : Supervision/safety;Set up;Sitting       Toilet Transfer: Minimal assistance;Min guard;Ambulation;Rolling walker (2 wheels)   Toileting- Clothing Manipulation and Hygiene: Min guard;Sit to/from stand       Functional mobility during ADLs: Minimal assistance;Min guard;Rolling walker (2 wheels) General ADL Comments: Pt educated on energy conservation strategies with handouts provided    Extremity/Trunk Assessment Upper Extremity Assessment Upper Extremity Assessment: Overall WFL for tasks assessed   Lower Extremity Assessment Lower Extremity Assessment: Defer to PT evaluation        Vision Baseline Vision/History: 1 Wears glasses Ability to See in Adequate Light: 0 Adequate Patient Visual Report: No change from baseline     Perception     Praxis      Cognition Arousal/Alertness: Awake/alert Behavior During Therapy: WFL for tasks assessed/performed, Flat affect Overall Cognitive Status: Within Functional Limits for tasks assessed  Exercises      Shoulder Instructions       General Comments SpO2 >90% throughout session on 4L O2    Pertinent Vitals/ Pain       Pain Assessment Pain  Assessment: 0-10 Pain Score: 5  Pain Location: R knee Pain Descriptors / Indicators: Aching, Discomfort Pain Intervention(s): Monitored during session, Repositioned  Home Living                                          Prior Functioning/Environment              Frequency  Min 2X/week        Progress Toward Goals  OT Goals(current goals can now be found in the care plan section)  Progress towards OT goals: Progressing toward goals     Plan Discharge plan remains appropriate;Frequency remains appropriate    Co-evaluation                 AM-PAC OT "6 Clicks" Daily Activity     Outcome Measure   Help from another person eating meals?: None Help from another person taking care of personal grooming?: A Little Help from another person toileting, which includes using toliet, bedpan, or urinal?: A Lot Help from another person bathing (including washing, rinsing, drying)?: A Lot Help from another person to put on and taking off regular upper body clothing?: A Little Help from another person to put on and taking off regular lower body clothing?: A Lot 6 Click Score: 16    End of Session Equipment Utilized During Treatment: Gait belt;Rolling walker (2 wheels);Oxygen  OT Visit Diagnosis: Other abnormalities of gait and mobility (R26.89);Unsteadiness on feet (R26.81);Muscle weakness (generalized) (M62.81)   Activity Tolerance Patient tolerated treatment well   Patient Left in chair;with call bell/phone within reach   Nurse Communication          Time: 7829-5621 OT Time Calculation (min): 23 min  Charges: OT General Charges $OT Visit: 1 Visit OT Treatments $Self Care/Home Management : 8-22 mins $Therapeutic Activity: 8-22 mins    Galen Manila 02/02/2023, 3:09 PM

## 2023-02-02 NOTE — Progress Notes (Addendum)
Progress Note  Patient Name: Lawrence [name] Date of Encounter: 02/02/2023  Primary Cardiologist: Armanda Magic, MD  Subjective   Had CP last night but none this morning. Overall feeling good except tired.  Inpatient Medications    Scheduled Meds:  allopurinol  200 mg Oral BID   ARIPiprazole  5 mg Oral QHS   atorvastatin  40 mg Oral Daily   busPIRone  10 mg Oral TID   dapagliflozin propanediol  10 mg Oral Q breakfast   diclofenac Sodium  4 g Topical QID   diltiazem  120 mg Oral Daily   fenofibrate  160 mg Oral Daily   fluticasone furoate-vilanterol  1 puff Inhalation Daily   And   umeclidinium bromide  1 puff Inhalation Daily   insulin aspart  0-15 Units Subcutaneous TID WC   insulin aspart  0-5 Units Subcutaneous QHS   insulin glargine-yfgn  30 Units Subcutaneous Daily   lacosamide  150 mg Oral BID   levETIRAcetam  500 mg Oral BID   lidocaine  1 patch Transdermal Q24H   metoprolol succinate  50 mg Oral Daily   mirtazapine  15 mg Oral QHS   pantoprazole  40 mg Oral BID   potassium chloride  40 mEq Oral Q4H   prazosin  2 mg Oral QHS   pregabalin  150 mg Oral TID   sodium chloride flush  3 mL Intravenous Q12H   sodium chloride flush  3 mL Intravenous Q12H   spironolactone  12.5 mg Oral Daily   torsemide  20 mg Oral Daily   traZODone  200 mg Oral QHS   venlafaxine XR  225 mg Oral Q breakfast   Continuous Infusions:  PRN Meds: acetaminophen **OR** acetaminophen, guaiFENesin, hydrALAZINE, ipratropium-albuterol, melatonin, morphine injection, ondansetron (ZOFRAN) IV, senna-docusate   Vital Signs    Vitals:   02/01/23 1741 02/01/23 2004 02/01/23 2210 02/02/23 0505  BP: 104/69 108/74  122/68  Pulse:  77 79 69  Resp:  20  19  Temp:  98.3 F (36.8 C)  98.3 F (36.8 C)  TempSrc:  Oral  Oral  SpO2:  96% 94% 93%  Weight:    (!) 162.8 kg  Height:        Intake/Output Summary (Last 24 hours) at 02/02/2023 0724 Last data filed at 02/01/2023 2200 Gross per 24 hour   Intake 180 ml  Output 2100 ml  Net -1920 ml      02/02/2023    5:05 AM 02/01/2023    6:20 AM 01/31/2023    4:37 AM  Last 3 Weights  Weight (lbs) 359 lb 365 lb 15.4 oz 366 lb 6.5 oz  Weight (kg) 162.841 kg 166 kg 166.2 kg     Telemetry    NSR occ PVC, occ couplet - Personally Reviewed  ECG    NSR 1st degree AVB, occ PVC, nonspecific STTW changes, prior inferior infarct similar to prior - Personally Reviewed  Physical Exam   GEN: No acute distress.  HEENT: Normocephalic, atraumatic, sclera non-icteric. Neck: No JVD or bruits. Cardiac: RRR 2/6 SEM no rubs or gallops.  Respiratory: Clear to auscultation bilaterally. Breathing is unlabored. GI: Soft, obese but soft, BS +x 4. MS: no deformity. Extremities: No clubbing or cyanosis. Significant BLE edema. Distal pedal pulses are 2+ and equal bilaterally. Neuro:  AAOx3. Follows commands. Psych:  Responds to questions appropriately with a normal affect.  Labs    High Sensitivity Troponin:   Recent Labs  Lab 01/22/23 2054 01/22/23 2348  TROPONINIHS 8 8      Cardiac EnzymesNo results for input(s): "TROPONINI" in the last 168 hours. No results for input(s): "TROPIPOC" in the last 168 hours.   Chemistry Recent Labs  Lab 01/31/23 0147 02/01/23 1401 02/02/23 0249  NA 138 134* 136  K 3.6 3.5 3.3*  CL 103 96* 98  CO2 27 26 29   GLUCOSE 121* 175* 120*  BUN 16 20 17   CREATININE 0.86 0.97 0.86  CALCIUM 8.4* 8.9 8.7*  GFRNONAA >60 >60 >60  ANIONGAP 8 12 9      Hematology Recent Labs  Lab 01/28/23 0224 01/31/23 0147 02/02/23 0249  WBC 10.0 10.1 11.6*  RBC 4.78 4.33 4.64  HGB 13.7 12.6* 13.6  HCT 42.5 39.6 41.9  MCV 88.9 91.5 90.3  MCH 28.7 29.1 29.3  MCHC 32.2 31.8 32.5  RDW 15.3 15.4 15.2  PLT 265 252 263    BNPNo results for input(s): "BNP", "PROBNP" in the last 168 hours.   DDimer No results for input(s): "DDIMER" in the last 168 hours.   Radiology    No results found.  Cardiac Studies   Cath  01/26/23   Prox LAD to Mid LAD lesion is 40% stenosed.   Prox Cx to Mid Cx lesion is 20% stenosed.   Ost 2nd Mrg lesion is 20% stenosed.   Mid RCA lesion is 40% stenosed.   Prox Cx lesion is 20% stenosed.   1.  Mild to moderate diffuse obstructive coronary artery disease without high-grade obstructions. 2.  Fick cardiac output of 9.7 L/min and Fick cardiac index of 3.5 L/min/m with the following hemodynamics:            Right atrial pressure mean of 9 mmHg            Right ventricular pressure 36/4 with an end-diastolic pressure of 16 mmHg            Mean wedge pressure of 14 mmHg            Pulmonary artery pressure of 34/15 with a mean of 24 mmHg   Recommendation: Continue evaluation for aortic valve intervention.   2D echo 12/22/22   1. Left ventricular ejection fraction, by estimation, is 60 to 65%. The  left ventricle has normal function. The left ventricle has no regional  wall motion abnormalities. There is mild left ventricular hypertrophy.  Left ventricular diastolic parameters  were normal.   2. Right ventricular systolic function is normal. The right ventricular  size is normal.   3. Left atrial size was moderately dilated.   4. The mitral valve is abnormal. No evidence of mitral valve  regurgitation. No evidence of mitral stenosis.   5. AS has progressed since TTE done 05/2022. The aortic valve was not  well visualized. There is moderate calcification of the aortic valve.  There is moderate thickening of the aortic valve. Aortic valve  regurgitation is not visualized. Severe aortic  valve stenosis.   6. Aortic dilatation noted. There is mild dilatation of the aortic root,  measuring 38 mm. There is mild dilatation of the ascending aorta,  measuring 40 mm.   7. The inferior vena cava is normal in size with greater than 50%  respiratory variability, suggesting right atrial pressure of 3 mmHg.   Patient Profile     non-obstructive CAD noted on cardiac catheterization  in 2019, chronic diastolic CHF, paroxysmal atrial fibrillation on Eliquis, severe aortic stenosis noted on recent Echo in 12/2022, hypertension, hyperlipidemia, type 2 diabetes  mellitus with peripheral neuropathy, COPD, obstructive sleep apnea on CPAP, seizure disorder, and prior tobacco use (quit about 6 years ago) who was admitted with acute hypoxic respiratory failure in setting of a/c diastolic HF, found to have severe AS as well. Hospital course complicated by intermittent chest pain, volume overload, soft BP at times.   Assessment & Plan    1. Acute hypoxic respiratory failure in setting of acute on chronic diastolic CHF, severe aortic stenosis, HTN with some hypotension as well - recent echo 12/22/2022 showed 60-65% with normal wall motion and diastolic parameters but progression of known aortic stenosis to the severe range. Cardiomems on 01/16/2023 was mildly elevated suggestive of fluid accumulation. - diuresed well with IV Lasix (also takes metolazone once a week on Tuesdays), stopped 01/26/23 due to soft BP in anticipation of cath - oral torsemide resumed 01/28/23 - received metolazone dose on 02/01/23 (takes on Tuesdays) - was originally planned to re-dose today but K is low - will replete K prior to re-dosing today, will review regimen with MD - continue Toprol, Farxiga, spironolctone - BP appears to be tolerating - cath findings with nonobstructive CAD, ongoing w/u for TAVR by structural heart team - pre TAVR scans were challenging due to poor contrast opacification - for dental surgery 02/03/23 for extractions due to multiple non-restorable teeth  - TAVR team following behind the scenes for timing of procedure, tentatively 7/2   2. Chest Pain, non-obstructive CAD, HLD - chronic intermittent atypical CP for 6-7 months - negative troponins for similar discomfort on admission, cath also with nonobstructive disease, not entirely typical for aortic stenosis - analgesia with Tylenol, oxycodone has  helped - not on ASA with concomitant Eliquis - continue beta blocker - continue statin   3. H/o PAF (on OAC PTA) with frequent PVCs on telemetry - Eliquis was on hold for cath, resumed 6/22, held starting 6/26 PM in prep for dental surgery 02/03/23 - continue Toprol and diltiazem - potassium addressed below - Mg has been normal   4. Hypokalemia - K 3.3 on KCl daily currently - will increase to q4hr x 2 doses in anticipation of possible metolazone (will review with MD)  Otherwise, per primary team: - COPD - Type 2 diabetes mellitus - Seizure disorder - Anxiety and depression - OSA on CPAP  For questions or updates, please contact [facility] Please consult [email or web address] for contact info under Cardiology/STEMI.  Signed, Laurann Montana, PA-C 02/02/2023, 7:24 AM     Personally seen and examined. Agree with APP above with the following comments: Breathing is the best it has been this admission. Leg swelling has improved.    Exam notable for: morbid obesity,improved edema, improved skin bullae and distant heart sounds.  No tachypnea  Labs notable for hypokalemi Tele: SR with PVCs; no NSVT  Would recommend  - Surgery on Friday; NPO at midnight  - BMP this PM; metolazone 2.5 today or tomorrow based on results - Eliquis old for surgery tomorrow - overall this is the most compensated he has been this week  Riley Lam, MD FASE [facility] Cardiologist [facility]  [address] [place], [place] [address] [phone]  11:06 AM

## 2023-02-02 NOTE — Progress Notes (Signed)
Physical Therapy Treatment Patient Details Name: Lawrence [name] MRN: [id number] DOB: 30-Nov-1955 Today's Date: 02/02/2023   History of Present Illness Pt is a 67 y/o male who presents 01/22/23 with SOB and intermittent L sided CP for ~2 weeks PTA. R/L heart cath scheduled for 6/20 while inpatient. S/P heart cath 6/20. PMH significant for HTN, CHF, PAF on Eliquis, aortic stenosis, CAD, CVA, DM II, COPD, AAA.    PT Comments    Pt received in supine and agreeable to session. Pt able to perform mobility tasks with up to min A this session. Pt able to tolerate gait trial in room, however distance was limited by R knee pain and DOE. SpO2 remained >90% on 4L throughout session. Pt reporting use of energy conservation and pacing techniques at home. Pt continues to benefit from PT services to progress toward functional mobility goals.     Recommendations for follow up therapy are one component of a multi-disciplinary discharge planning process, led by the attending physician.  Recommendations may be updated based on patient status, additional functional criteria and insurance authorization.     Assistance Recommended at Discharge Intermittent Supervision/Assistance  Patient can return home with the following A little help with walking and/or transfers;A little help with bathing/dressing/bathroom;Assistance with cooking/housework;Assist for transportation;Help with stairs or ramp for entrance   Equipment Recommendations  None recommended by PT    Recommendations for Other Services       Precautions / Restrictions Precautions Precautions: Fall Precaution Comments: monitor HR and sats, reports of dizziness Restrictions Weight Bearing Restrictions: No     Mobility  Bed Mobility Overal bed mobility: Needs Assistance Bed Mobility: Supine to Sit     Supine to sit: Supervision, HOB elevated     General bed mobility comments: increased time and use of bedrail    Transfers Overall transfer  level: Needs assistance Equipment used: Rolling walker (2 wheels) Transfers: Sit to/from Stand Sit to Stand: From elevated surface, Min assist           General transfer comment: Min A for power up and anterior weight shift after pt with unsuccessful attempt.    Ambulation/Gait Ambulation/Gait assistance: Min guard Gait Distance (Feet): 25 Feet Assistive device: Rolling walker (2 wheels) Gait Pattern/deviations: Step-to pattern, Step-through pattern, Decreased stride length, Antalgic, Trunk flexed, Wide base of support, Knee hyperextension - right Gait velocity: Decreased     General Gait Details: Heavy reliance on RW support. R knee appearing to hyperextend and pt reporting R knee pain due to arthritis. Cues for RW proximity due to pt pushing RW out too far at times.      Balance Overall balance assessment: Needs assistance Sitting-balance support: No upper extremity supported, Feet supported Sitting balance-Leahy Scale: Good Sitting balance - Comments: sitting EOB   Standing balance support: Bilateral upper extremity supported, Reliant on assistive device for balance, During functional activity Standing balance-Leahy Scale: Poor Standing balance comment: reliant on RW support                            Cognition Arousal/Alertness: Awake/alert Behavior During Therapy: WFL for tasks assessed/performed, Flat affect Overall Cognitive Status: Within Functional Limits for tasks assessed                                          Exercises      General  Comments General comments (skin integrity, edema, etc.): SpO2 >90% throughout session on 4L O2      Pertinent Vitals/Pain Pain Assessment Pain Assessment: Faces Faces Pain Scale: Hurts even more Pain Location: R knee Pain Descriptors / Indicators: Aching, Discomfort Pain Intervention(s): Monitored during session, Repositioned     PT Goals (current goals can now be found in the care plan  section) Acute Rehab PT Goals Patient Stated Goal: Back to baseline of function PT Goal Formulation: With patient Time For Goal Achievement: 02/08/23 Potential to Achieve Goals: Good Progress towards PT goals: Progressing toward goals    Frequency    Min 1X/week      PT Plan Current plan remains appropriate    AM-PAC PT "6 Clicks" Mobility   Outcome Measure  Help needed turning from your back to your side while in a flat bed without using bedrails?: A Little Help needed moving from lying on your back to sitting on the side of a flat bed without using bedrails?: A Little Help needed moving to and from a bed to a chair (including a wheelchair)?: A Little Help needed standing up from a chair using your arms (e.g., wheelchair or bedside chair)?: A Little Help needed to walk in hospital room?: A Little Help needed climbing 3-5 steps with a railing? : Total 6 Click Score: 16    End of Session Equipment Utilized During Treatment: Gait belt;Oxygen Activity Tolerance: Patient limited by fatigue;Patient limited by pain Patient left: with call bell/phone within reach;in chair Nurse Communication: Mobility status PT Visit Diagnosis: Unsteadiness on feet (R26.81);Muscle weakness (generalized) (M62.81);Difficulty in walking, not elsewhere classified (R26.2)     Time: 8119-1478 PT Time Calculation (min) (ACUTE ONLY): 21 min  Charges:  $Gait Training: 8-22 mins                     Lawrence [name], PTA Acute Rehabilitation Services Secure Chat Preferred  [phone]    Lawrence [name] 02/02/2023, 1:53 PM

## 2023-02-02 NOTE — Progress Notes (Signed)
Mobility Specialist Progress Note:   02/02/23 1121  Mobility  Activity Ambulated with assistance in hallway  Level of Assistance Minimal assist, patient does 75% or more  Assistive Device Front wheel walker  Distance Ambulated (ft) 100 ft  Activity Response Tolerated well  Mobility Referral Yes  $Mobility charge 1 Mobility  Mobility Specialist Start Time (ACUTE ONLY) 1050  Mobility Specialist Stop Time (ACUTE ONLY) 1110  Mobility Specialist Time Calculation (min) (ACUTE ONLY) 20 min    Pre Mobility: 79 HR, 97% SpO2 Post Mobility: 91 HR, 95% SpO2  Pt received in bed, agreeable to mobility after using bathroom. VSS on 3 L during ambulation. Pt mentioned leg pain on 5/10 scale, denied any feelings of SOB. Pt returned to bed with call bell in hand and all needs met.   Leory Plowman  Mobility Specialist Please contact via [employer] office at [phone]

## 2023-02-02 NOTE — Plan of Care (Signed)
  Problem: Education: Goal: Knowledge of General Education information will improve Description: Including pain rating scale, medication(s)/side effects and non-pharmacologic comfort measures Outcome: Progressing   Problem: Nutrition: Goal: Adequate nutrition will be maintained Outcome: Progressing   Problem: Coping: Goal: Level of anxiety will decrease Outcome: Progressing   Problem: Elimination: Goal: Will not experience complications related to bowel motility Outcome: Progressing Goal: Will not experience complications related to urinary retention Outcome: Progressing   Problem: Health Behavior/Discharge Planning: Goal: Ability to manage health-related needs will improve Outcome: Not Progressing   Problem: Clinical Measurements: Goal: Respiratory complications will improve Outcome: Not Progressing   Problem: Activity: Goal: Risk for activity intolerance will decrease Outcome: Not Progressing   Problem: Pain Managment: Goal: General experience of comfort will improve Outcome: Not Progressing

## 2023-02-02 NOTE — H&P (View-Only) (Signed)
[facility]   [facility]ID[facility]IP[facility] VALVE CLINIC       [address] [place].[address]       [name] [address]             [phone]          CARDIOTHORACIC SURGERY CONSULTATION REPORT  PCP is [name], PA-C [facility] Providers Cardiologist:  Armanda Magic, MD  Electrophysiologist:  Regan Lemming, MD  Advanced Heart Failure:  Marca Ancona, MD     Reason for consultation:  Severe aortic stenosis  HPI:  Lawrence [name] is a 67 year old gentleman with a history of chronic diastolic dysfunction, paroxysmal atrial fibrillation on Eliquis, elevated BMI of 46, COPD, obstructive sleep apnea on CPAP, chronic back pain, seizure disorder, and an echocardiogram done recently which demonstrated severe aortic stenosis who was admitted with acute on chronic diastolic heart failure.  The patient has been followed for a long time by Dr. Shirlee Latch who last saw him on April 29 of this year.  He reported being short of breath with pretty minimal exertion.  He does use a walker because of chronic back pain issues.  He is quite deconditioned and has been working with physical therapy.  His weight was noted to be up by almost 20 pounds at that time as well.  His regimen of torsemide 80 mg twice daily, Farxiga 10 mg daily, and spironolactone 12.5 mg daily as well as Toprol and diltiazem were continued.  Metolazone 2.5 mg every Tuesday was added.  He was referred for an echocardiogram which demonstrated severe aortic stenosis.  There were plans for referral to structural heart for further evaluation and recommendations regarding treatment.  Unfortunately he developed increasing chest pain and shortness of breath.  He presented to the emergency department on June 17.  A PE protocol CT was negative.  Troponins x 2 were negative and his BNP was not elevated at 25.  Other laboratories were relatively reassuring.  He was noted to be relatively hypoxic in the high 80s and low 90s.   Supplemental oxygen was administered. He was stabilized medically and diuresed close to 10 L.  He feels much much better.  He is able to lay flat without any issues.   He has a low functional capacity.  He does like to walk around his house and property with his walker and gets out of the house with a scooter,  however he has found that he has had increasing shortness of breath with lower lower levels of exertion.  He has had some infrequent presyncope but no frank syncope.  He has had chest tightness at times but this is not a regular or routine occurrence.  He has noted some peripheral edema and again problems with laying flat on his back.   He underwent coronary angiography and right heart catheterization today which showed mild to moderate obstructive coronary artery disease, preserved cardiac output, and a mean RA pressure of 9 mmHg with a mean wedge pressure of 14 mmHg.   He lives with his significant other.  He used to work at [employer].  He quit smoking sometime ago.  Past Medical History:  Diagnosis Date   Acquired dilation of ascending aorta and aortic root (HCC)    40mm by echo 01/2021   Adenomatous colon polyp 2007   Anemia    Anxiety    Asthma    BPH without obstruction/lower urinary tract symptoms 02/22/2017   Chronic diastolic (congestive) heart failure (HCC)  Chronic venous stasis 03/07/2019   COPD (chronic obstructive pulmonary disease) (HCC)    Coronary artery calcification seen on CAT scan    Depression    Diabetic neuropathy (HCC) 09/11/2019   History of colon polyps 08/24/2018   Hypertension    Morbid obesity (HCC)    OSA (obstructive sleep apnea)    Pain due to onychomycosis of toenails of both feet 09/11/2019   Peripheral neuropathy 02/22/2017   Primary osteoarthritis, left shoulder 03/05/2017   PTSD (post-traumatic stress disorder)    Pure hypercholesterolemia    QT prolongation 03/07/2019   Seizures (HCC)    Severe aortic stenosis    Sinus tachycardia  03/07/2019   Sleep apnea    CPAP   Type 2 diabetes mellitus with vascular disease (HCC) 09/11/2019    Past Surgical History:  Procedure Laterality Date   ENDOVENOUS ABLATION SAPHENOUS VEIN W/ LASER Right 08/20/2020   endovenous laser ablation right greater saphenous vein by Cari Caraway MD    ENDOVENOUS ABLATION SAPHENOUS VEIN W/ LASER Left 11/16/2022   endovenous laser ablation left greater saphenous vein by Cari Caraway MD   JOINT REPLACEMENT     left knee replacement x 2   KNEE ARTHROSCOPY Bilateral    LEFT HEART CATH AND CORONARY ANGIOGRAPHY N/A 01/17/2018   Procedure: LEFT HEART CATH AND CORONARY ANGIOGRAPHY;  Surgeon: Marykay Lex, MD;  Location[facility] INVASIVE CV LAB;  Service: Cardiovascular;  Laterality: N/A;   PRESSURE SENSOR/CARDIOMEMS N/A 08/26/2021   Procedure: PRESSURE SENSOR/CARDIOMEMS;  Surgeon: Laurey Morale, MD;  Location: [facility] INVASIVE CV LAB;  Service: Cardiovascular;  Laterality: N/A;   RIGHT HEART CATH N/A 08/26/2021   Procedure: RIGHT HEART CATH;  Surgeon: Laurey Morale, MD;  Location: [facility] INVASIVE CV LAB;  Service: Cardiovascular;  Laterality: N/A;   RIGHT HEART CATH AND CORONARY ANGIOGRAPHY N/A 01/26/2023   Procedure: RIGHT HEART CATH AND CORONARY ANGIOGRAPHY;  Surgeon: Orbie Pyo, MD;  Location: [facility] INVASIVE CV LAB;  Service: Cardiovascular;  Laterality: N/A;   TEE WITHOUT CARDIOVERSION N/A 08/26/2021   Procedure: TRANSESOPHAGEAL ECHOCARDIOGRAM (TEE);  Surgeon: Laurey Morale, MD;  Location: [facility] ENDOSCOPY;  Service: Cardiovascular;  Laterality: N/A;   UMBILICAL HERNIA REPAIR      Family History  Problem Relation Age of Onset   CAD Maternal Grandfather    Diabetes Other    Diabetes Mellitus II Neg Hx    Colon cancer Neg Hx    Esophageal cancer Neg Hx    Inflammatory bowel disease Neg Hx    Liver disease Neg Hx    Pancreatic cancer Neg Hx    Rectal cancer Neg Hx    Stomach cancer Neg Hx    Sleep apnea Neg Hx     Social History    Socioeconomic History   Marital status: Significant Other    Spouse name: Lawrence [name]   Number of children: 1   Years of education: Not on file   Highest education level: Associate degree: academic program  Occupational History   Occupation: retired    Comment: Building control surveyor  Tobacco Use   Smoking status: Former    Packs/day: 1.00    Years: 44.00    Additional pack years: 0.00    Total pack years: 44.00    Types: Cigarettes    Quit date: 03/07/2016    Years since quitting: 6.9    Passive exposure: Past   Smokeless tobacco: Never  Vaping Use   Vaping Use: Never used  Substance and Sexual  Activity   Alcohol use: Yes    Comment: rare   Drug use: No   Sexual activity: Not on file  Other Topics Concern   Not on file  Social History Narrative   Lives with wife Lawrence [name]   Right Handed   Drinks 1-2 cups caffeine   Social Determinants of Health   Financial Resource Strain: Medium Risk (06/08/2021)   Overall Financial Resource Strain (CARDIA)    Difficulty of Paying Living Expenses: Somewhat hard  Food Insecurity: Food Insecurity Present (01/23/2023)   Hunger Vital Sign    Worried About Running Out of Food in the Last Year: Sometimes true    Ran Out of Food in the Last Year: Sometimes true  Transportation Needs: No Transportation Needs (01/23/2023)   PRAPARE - Administrator, Civil Service (Medical): No    Lack of Transportation (Non-Medical): No  Physical Activity: Not on file  Stress: Not on file  Social Connections: Not on file  Intimate Partner Violence: Unknown (01/23/2023)   Humiliation, Afraid, Rape, and Kick questionnaire    Fear of Current or Ex-Partner: No    Emotionally Abused: No    Physically Abused: Patient unable to answer    Sexually Abused: No    Prior to Admission medications   Medication Sig Start Date End Date Taking? Authorizing Provider  acetaminophen (TYLENOL) 650 MG CR tablet Take 1,300 mg by mouth every 8 (eight) hours as needed  for pain.   Yes [provider]  albuterol (VENTOLIN HFA) 108 (90 Base) MCG/ACT inhaler Inhale 2 puffs into the lungs every 6 (six) hours as needed for wheezing or shortness of breath. 08/18/22  Yes Hunsucker, Lesia Sago, MD  allopurinol (ZYLOPRIM) 100 MG tablet Take 200 mg by mouth 2 (two) times daily.   Yes [provider]  ARIPiprazole (ABILIFY) 5 MG tablet TAKE ONE TABLET BY MOUTH EVERYDAY AT BEDTIME Patient taking differently: Take 5 mg by mouth at bedtime. 03/09/22  Yes Mozingo, Thereasa Solo, NP  atorvastatin (LIPITOR) 40 MG tablet Take 1 tablet (40 mg total) by mouth daily. 10/12/22  Yes Conte, Tessa N, PA-C  busPIRone (BUSPAR) 10 MG tablet Take 1 tablet (10 mg total) by mouth 3 (three) times daily. 03/09/22  Yes Mozingo, Thereasa Solo, NP  cephALEXin (KEFLEX) 500 MG capsule Take 1 capsule (500 mg total) by mouth 4 (four) times daily. 01/22/23  Yes Schuman, Beverly Gust, PA-C  cetirizine (ZYRTEC) 10 MG tablet Take 10 mg by mouth at bedtime.    Yes [provider]  Cholecalciferol (VITAMIN D3 PO) Take 2,000 Units by mouth daily.   Yes [provider]  dapagliflozin propanediol (FARXIGA) 10 MG TABS tablet TAKE ONE TABLET BY MOUTH BEFORE BREAKFAST Patient taking differently: Take 10 mg by mouth daily with breakfast. 08/05/22  Yes Reather Littler, MD  diltiazem (CARDIZEM CD) 120 MG 24 hr capsule TAKE ONE CAPSULE BY MOUTH ONCE DAILY 12/27/22  Yes Laurey Morale, MD  ELIQUIS 5 MG TABS tablet TAKE ONE TABLET BY MOUTH TWICE DAILY Patient taking differently: Take 5 mg by mouth 2 (two) times daily. 08/05/22  Yes Laurey Morale, MD  fenofibrate (TRICOR) 145 MG tablet Take 1 tablet (145 mg total) by mouth daily. 10/12/22  Yes Conte, Tessa N, PA-C  ferrous sulfate 324 (65 Fe) MG TBEC Take 324 mg by mouth daily with breakfast.    Yes [provider]  fluticasone (FLONASE) 50 MCG/ACT nasal spray Place 2 sprays into both nostrils daily.  Yes [provider]   Fluticasone-Umeclidin-Vilant (TRELEGY ELLIPTA) 200-62.5-25 MCG/ACT AEPB INHALE 1 PUFF BY MOUTH INTO LUNGS ONCE DAILY Patient taking differently: Inhale 1 puff into the lungs daily. 08/18/22  Yes Hunsucker, Lesia Sago, MD  insulin aspart (NOVOLOG FLEXPEN) 100 UNIT/ML FlexPen INJECT 12 UNITS BEFORE MEALS, keep sugar TWO HOURS AFTER meals UNDER 180 AT least Patient taking differently: Inject 12 Units into the skin 3 (three) times daily with meals. 01/23/23  Yes Motwani, Carin Hock, MD  Lacosamide 150 MG TABS Take 1 tablet (150 mg total) by mouth 2 (two) times daily. 10/11/22  Yes Windell Norfolk, MD  levETIRAcetam (KEPPRA) 500 MG tablet Take 1 tablet (500 mg total) by mouth 2 (two) times daily. 10/11/22  Yes Windell Norfolk, MD  losartan (COZAAR) 25 MG tablet TAKE ONE TABLET BY MOUTH ONCE DAILY 12/28/22  Yes Laurey Morale, MD  metFORMIN (GLUCOPHAGE-XR) 500 MG 24 hr tablet Take 4 tablets (2,000 mg total) by mouth daily. 06/16/21  Yes Romero Belling, MD  metolazone (ZAROXOLYN) 2.5 MG tablet Take 1 tablet (2.5 mg total) by mouth once a week. Every Tuesday 12/05/22 03/05/23 Yes Laurey Morale, MD  metoprolol succinate (TOPROL-XL) 25 MG 24 hr tablet Take 1 tablet (25 mg total) by mouth daily. 09/08/22  Yes Laurey Morale, MD  mirtazapine (REMERON) 15 MG tablet TAKE ONE TABLET BY MOUTH EVERYDAY AT BEDTIME Patient taking differently: Take 15 mg by mouth at bedtime. 12/28/22  Yes Mozingo, Thereasa Solo, NP  omeprazole (PRILOSEC) 20 MG capsule Take 20 mg by mouth every morning. 05/29/20  Yes [provider]  potassium chloride SA (KLOR-CON M) 20 MEQ tablet Take 2 tablets (40 mEq total) by mouth 2 (two) times daily. Except Tuesdays when you take ( 3 Tabs) 12/05/22  Yes Laurey Morale, MD  prazosin (MINIPRESS) 2 MG capsule Take two tablets at bedtime. Patient taking differently: Take 2 mg by mouth at bedtime. 03/09/22  Yes Mozingo, Thereasa Solo, NP  pregabalin (LYRICA) 150 MG capsule Take 1 capsule (150 mg  total) by mouth 3 (three) times daily. 10/11/22  Yes Camara, Amalia Hailey, MD  repaglinide (PRANDIN) 2 MG tablet TAKE TWO TABLETS BY MOUTH TWICE DAILY BEFORE A meal Patient taking differently: Take 2 mg by mouth 2 (two) times daily before a meal. 10/04/22  Yes Reather Littler, MD  spironolactone (ALDACTONE) 25 MG tablet Take 0.5 tablets (12.5 mg total) by mouth daily. 11/16/22  Yes Turner, Cornelious Bryant, MD  tirzepatide [facility]) 12.5 MG/0.5ML Pen Inject 12.5 mg into the skin once a week. 01/19/23  Yes Motwani, Komal, MD  torsemide (DEMADEX) 20 MG tablet Take 5 tablets (100 mg total) by mouth 2 (two) times daily. 12/29/22  Yes Clegg, Amy D, NP  traZODone (DESYREL) 100 MG tablet Take 200 mg by mouth at bedtime. 10/18/22  Yes [provider]  VASCEPA 1 g capsule TAKE TWO CAPSULES BY MOUTH TWICE DAILY 08/05/22  Yes Laurey Morale, MD  venlafaxine XR (EFFEXOR-XR) 75 MG 24 hr capsule Take 3 capsules (225 mg total) by mouth daily with breakfast. 03/09/22  Yes Mozingo, Thereasa Solo, NP  diclofenac Sodium (VOLTAREN) 1 % GEL Apply 4 g topically 4 (four) times daily. To right knee. Patient not taking: Reported on 01/23/2023 10/31/22   Arrien, York Ram, MD  Insulin Pen Needle (B-D ULTRAFINE III SHORT PEN) 31G X 8 MM MISC Inject 1 each into the skin 3 (three) times daily before meals. Use as instructed 4X daily. 12/06/22   Reather Littler,  MD  TRESIBA FLEXTOUCH 100 UNIT/ML FlexTouch Pen INJECT 40 UNITS into THE SKIN ONCE DAILY 01/27/23   Altamese Port Dickinson, MD    Current Facility-Administered Medications  Medication Dose Route Frequency Provider Last Rate Last Admin   acetaminophen (TYLENOL) tablet 650 mg  650 mg Oral Q6H PRN Howerter, Justin B, DO   650 mg at 02/01/23 1638   Or   acetaminophen (TYLENOL) suppository 650 mg  650 mg Rectal Q6H PRN Howerter, Justin B, DO       allopurinol (ZYLOPRIM) tablet 200 mg  200 mg Oral BID Katrinka Blazing, Rondell A, MD   200 mg at 02/01/23 2243   ARIPiprazole (ABILIFY) tablet 5 mg  5 mg Oral  QHS Smith, Rondell A, MD   5 mg at 02/01/23 2244   atorvastatin (LIPITOR) tablet 40 mg  40 mg Oral Daily Katrinka Blazing, Rondell A, MD   40 mg at 02/01/23 0826   busPIRone (BUSPAR) tablet 10 mg  10 mg Oral TID Clydie Braun, MD   10 mg at 02/01/23 2243   dapagliflozin propanediol (FARXIGA) tablet 10 mg  10 mg Oral Q breakfast Madelyn Flavors A, MD   10 mg at 02/01/23 1610   diclofenac Sodium (VOLTAREN) 1 % topical gel 4 g  4 g Topical QID Azucena Fallen, MD   4 g at 02/01/23 2245   diltiazem (CARDIZEM CD) 24 hr capsule 120 mg  120 mg Oral Daily Katrinka Blazing, Rondell A, MD   120 mg at 02/01/23 0827   fenofibrate tablet 160 mg  160 mg Oral Daily Smith, Rondell A, MD   160 mg at 02/01/23 0825   fluticasone furoate-vilanterol (BREO ELLIPTA) 200-25 MCG/ACT 1 puff  1 puff Inhalation Daily Madelyn Flavors A, MD   1 puff at 02/01/23 0919   And   umeclidinium bromide (INCRUSE ELLIPTA) 62.5 MCG/ACT 1 puff  1 puff Inhalation Daily Madelyn Flavors A, MD   1 puff at 02/01/23 0919   guaiFENesin (ROBITUSSIN) 100 MG/5ML liquid 5 mL  5 mL Oral Q4H PRN Amin, Ankit Chirag, MD       hydrALAZINE (APRESOLINE) injection 10 mg  10 mg Intravenous Q4H PRN Amin, Ankit Chirag, MD       insulin aspart (novoLOG) injection 0-15 Units  0-15 Units Subcutaneous TID WC Smith, Rondell A, MD   2 Units at 02/01/23 1646   insulin aspart (novoLOG) injection 0-5 Units  0-5 Units Subcutaneous QHS Madelyn Flavors A, MD   2 Units at 01/25/23 2229   insulin glargine-yfgn (SEMGLEE) injection 30 Units  30 Units Subcutaneous Daily Dunn, Dayna N, PA-C   30 Units at 02/01/23 0825   ipratropium-albuterol (DUONEB) 0.5-2.5 (3) MG/3ML nebulizer solution 3 mL  3 mL Nebulization Q4H PRN Amin, Ankit Chirag, MD       lacosamide (VIMPAT) tablet 150 mg  150 mg Oral BID Katrinka Blazing, Rondell A, MD   150 mg at 02/01/23 2243   levETIRAcetam (KEPPRA) tablet 500 mg  500 mg Oral BID Madelyn Flavors A, MD   500 mg at 02/01/23 2242   lidocaine (LIDODERM) 5 % 1 patch  1 patch  Transdermal Q24H Azucena Fallen, MD   1 patch at 02/01/23 2047   melatonin tablet 3 mg  3 mg Oral QHS PRN Howerter, Justin B, DO       metoprolol succinate (TOPROL-XL) 24 hr tablet 50 mg  50 mg Oral Daily Marjie Skiff E, PA-C   50 mg at 02/01/23 0827   mirtazapine (REMERON) tablet 15 mg  15 mg Oral QHS Madelyn Flavors A, MD   15 mg at 02/01/23 2244   morphine (PF) 2 MG/ML injection 2 mg  2 mg Intravenous Q4H PRN Ronie Spies N, PA-C   2 mg at 02/01/23 2102   ondansetron (ZOFRAN) injection 4 mg  4 mg Intravenous Q6H PRN Howerter, Justin B, DO       pantoprazole (PROTONIX) EC tablet 40 mg  40 mg Oral BID Dunn, Dayna N, PA-C   40 mg at 02/01/23 2242   potassium chloride SA (KLOR-CON M) CR tablet 40 mEq  40 mEq Oral Q4H Dunn, Dayna N, PA-C       prazosin (MINIPRESS) capsule 2 mg  2 mg Oral QHS Smith, Rondell A, MD   2 mg at 02/01/23 2244   pregabalin (LYRICA) capsule 150 mg  150 mg Oral TID Clydie Braun, MD   150 mg at 02/01/23 2242   senna-docusate (Senokot-S) tablet 1 tablet  1 tablet Oral QHS PRN Amin, Ankit Chirag, MD       sodium chloride flush (NS) 0.9 % injection 3 mL  3 mL Intravenous Q12H Smith, Rondell A, MD   3 mL at 02/01/23 2245   sodium chloride flush (NS) 0.9 % injection 3 mL  3 mL Intravenous Q12H Marjie Skiff E, PA-C   3 mL at 02/01/23 2246   spironolactone (ALDACTONE) tablet 12.5 mg  12.5 mg Oral Daily Madelyn Flavors A, MD   12.5 mg at 02/01/23 0827   torsemide (DEMADEX) tablet 20 mg  20 mg Oral Daily Jodelle Red, MD   20 mg at 02/01/23 0827   traZODone (DESYREL) tablet 200 mg  200 mg Oral QHS Smith, Rondell A, MD   200 mg at 02/01/23 2241   venlafaxine XR (EFFEXOR-XR) 24 hr capsule 225 mg  225 mg Oral Q breakfast Madelyn Flavors A, MD   225 mg at 02/01/23 8756    Allergies  Allergen Reactions   Vancomycin Other (See Comments)    "Red Man Syndrome" 02/02/17: possible cause for rash under both arms   Niacin And Related Other (See Comments)    Red man  syndrome   Tubersol [Tuberculin, Ppd] Other (See Comments)    Reaction unknown   Doxycycline Rash and Other (See Comments)      Review of Systems:   General:  normal appetite, + decreased energy, +  weight gain, no weight loss, no fever  Cardiac:  + chest pain with exertion, no chest pain at rest, +SOB with mild exertion, + resting SOB, no PND, + orthopnea, no palpitations, + arrhythmia, + atrial fibrillation, + LE edema, + dizzy spells, no syncope  Respiratory:  + shortness of breath, no home oxygen, no productive cough, no dry cough, no bronchitis, no wheezing, no hemoptysis, no asthma, no pain with inspiration or cough, + sleep apnea, + CPAP at night  GI:   no difficulty swallowing, no reflux, no frequent heartburn, no hiatal hernia, no abdominal pain, no constipation, no diarrhea, no hematochezia, no hematemesis, no melena  GU:   no dysuria,  no frequency, no urinary tract infection, no hematuria, no enlarged prostate, no kidney stones, no kidney disease  Vascular:  no pain suggestive of claudication, no pain in feet, no leg cramps, no varicose veins, no DVT, no non-healing foot ulcer  Neuro:   no stroke, no TIA's, no seizures, no headaches, no temporary blindness one eye,  no slurred speech, no peripheral neuropathy, no chronic pain, + instability of gait, no memory/cognitive  dysfunction  Musculoskeletal: + arthritis - primarily involving the knees and back, no joint swelling, no myalgias, + difficulty walking, + decreased mobility   Skin:   no rash, no itching, no skin infections, no pressure sores or ulcerations  Psych:   no anxiety, no depression, no nervousness, no unusual recent stress  Eyes:   no blurry vision, no floaters, no recent vision changes, + wears glasses   ENT:   no hearing loss, no loose or painful teeth, no dentures, last saw dentist many years ago  Hematologic:  no easy bruising, no abnormal bleeding, no clotting disorder, no frequent epistaxis  Endocrine:  + diabetes,  does check CBG's at home     Physical Exam:   BP 122/68 (BP Location: Right Arm)   Pulse 69   Temp 98.3 F (36.8 C) (Oral)   Resp 19   Ht 6\' 2"  (1.88 m)   Wt (!) 162.8 kg   SpO2 93%   BMI 46.09 kg/m   General:  Morbidly obese, looks comfortable lying in bed  HEENT:  Unremarkable, NCAT, PERLA, EOMI, poor dentition  Neck:   no JVD, no bruits, no adenopathy   Chest:   clear to auscultation, symmetrical breath sounds, no wheezes, no rhonchi   CV:   RRR, 2/6 systolic murmur RSB, no diastolic murmur  Abdomen:  soft, obese, non-tender, no masses, groins without rash  Extremities:  warm, well-perfused, pulses not palpable at ankles, moderate lower extremity edema  Rectal/GU  Deferred  Neuro:   Grossly non-focal and symmetrical throughout  Skin:   Clean and dry, no rashes, no breakdown  Diagnostic Tests:  Lab Results: Recent Labs    01/31/23 0147 02/02/23 0249  WBC 10.1 11.6*  HGB 12.6* 13.6  HCT 39.6 41.9  PLT 252 263   BMET:  Recent Labs    02/01/23 1401 02/02/23 0249  NA 134* 136  K 3.5 3.3*  CL 96* 98  CO2 26 29  GLUCOSE 175* 120*  BUN 20 17  CREATININE 0.97 0.86  CALCIUM 8.9 8.7*    CBG (last 3)  Recent Labs    02/01/23 1201 02/01/23 1635 02/01/23 2117  GLUCAP 157* 129* 172*     ECHOCARDIOGRAM REPORT       Patient Name:   Lawrence [name] Date of Exam: 12/22/2022  Medical Rec #:  [id number]        Height:       74.0 in  Accession #:    [id number]       Weight:       360.0 lb  Date of Birth:  04-28-1956        BSA:          2.790 m  Patient Age:    66 years         BP:           100/60 mmHg  Patient Gender: M                HR:           89 bpm.  Exam Location:  Outpatient   Procedure: 2D Echo, Color Doppler and Cardiac Doppler   Indications:    I35.0 Aortic Stenosis    History:        Patient has prior history of Echocardiogram examinations.  Asthma                 and COPD, Aortic Stenosis; Risk Factors:Diabetes,  Hypertension  and Sleep Apnea.    Sonographer:    L. Thornton-Maynard  Referring Phys: [id number] DALTON S MCLEAN   IMPRESSIONS     1. Left ventricular ejection fraction, by estimation, is 60 to 65%. The  left ventricle has normal function. The left ventricle has no regional  wall motion abnormalities. There is mild left ventricular hypertrophy.  Left ventricular diastolic parameters  were normal.   2. Right ventricular systolic function is normal. The right ventricular  size is normal.   3. Left atrial size was moderately dilated.   4. The mitral valve is abnormal. No evidence of mitral valve  regurgitation. No evidence of mitral stenosis.   5. AS has progressed since TTE done 05/2022. The aortic valve was not  well visualized. There is moderate calcification of the aortic valve.  There is moderate thickening of the aortic valve. Aortic valve  regurgitation is not visualized. Severe aortic  valve stenosis.   6. Aortic dilatation noted. There is mild dilatation of the aortic root,  measuring 38 mm. There is mild dilatation of the ascending aorta,  measuring 40 mm.   7. The inferior vena cava is normal in size with greater than 50%  respiratory variability, suggesting right atrial pressure of 3 mmHg.   FINDINGS   Left Ventricle: Left ventricular ejection fraction, by estimation, is 60  to 65%. The left ventricle has normal function. The left ventricle has no  regional wall motion abnormalities. The left ventricular internal cavity  size was normal in size. There is   mild left ventricular hypertrophy. Left ventricular diastolic parameters  were normal.   Right Ventricle: The right ventricular size is normal. No increase in  right ventricular wall thickness. Right ventricular systolic function is  normal.   Left Atrium: Left atrial size was moderately dilated.   Right Atrium: Right atrial size was normal in size.   Pericardium: There is no evidence of pericardial effusion.   Mitral Valve:  The mitral valve is abnormal. There is mild thickening of  the mitral valve leaflet(s). There is mild calcification of the mitral  valve leaflet(s). Mild mitral annular calcification. No evidence of mitral  valve regurgitation. No evidence of  mitral valve stenosis. MV peak gradient, 3.5 mmHg. The mean mitral valve  gradient is 3.0 mmHg.   Tricuspid Valve: The tricuspid valve is normal in structure. Tricuspid  valve regurgitation is not demonstrated. No evidence of tricuspid  stenosis.   Aortic Valve: AS has progressed since TTE done 05/2022. The aortic valve  was not well visualized. There is moderate calcification of the aortic  valve. There is moderate thickening of the aortic valve. Aortic valve  regurgitation is not visualized. Severe  aortic stenosis is present. Aortic valve mean gradient measures 40.0 mmHg.  Aortic valve peak gradient measures 65.7 mmHg. Aortic valve area, by VTI  measures 1.03 cm.   Pulmonic Valve: The pulmonic valve was normal in structure. Pulmonic valve  regurgitation is not visualized. No evidence of pulmonic stenosis.   Aorta: Aortic dilatation noted. There is mild dilatation of the aortic  root, measuring 38 mm. There is mild dilatation of the ascending aorta,  measuring 40 mm.   Venous: The inferior vena cava is normal in size with greater than 50%  respiratory variability, suggesting right atrial pressure of 3 mmHg.   IAS/Shunts: No atrial level shunt detected by color flow Doppler.     LEFT VENTRICLE  PLAX 2D  LVIDd:         6.20  cm     Diastology  LVIDs:         4.90 cm     LV e' medial:    5.66 cm/s  LV PW:         1.20 cm     LV E/e' medial:  16.3  LV IVS:        1.30 cm     LV e' lateral:   7.62 cm/s  LVOT diam:     2.10 cm     LV E/e' lateral: 12.1  LV SV:         84  LV SV Index:   30  LVOT Area:     3.46 cm    LV Volumes (MOD)  LV vol d, MOD A2C: 60.4 ml  LV vol d, MOD A4C: 89.7 ml  LV vol s, MOD A2C: 17.0 ml  LV vol s, MOD  A4C: 22.3 ml  LV SV MOD A2C:     43.4 ml  LV SV MOD A4C:     89.7 ml  LV SV MOD BP:      54.7 ml   RIGHT VENTRICLE             IVC  RV S prime:     12.20 cm/s  IVC diam: 2.20 cm  TAPSE (M-mode): 3.0 cm   LEFT ATRIUM             Index  LA diam:        4.30 cm 1.54 cm/m  LA Vol (A2C):   28.9 ml 10.36 ml/m  LA Vol (A4C):   80.6 ml 28.89 ml/m  LA Biplane Vol: 50.4 ml 18.06 ml/m   AORTIC VALVE                     PULMONIC VALVE  AV Area (Vmax):    1.03 cm      PV Vmax:       1.12 m/s  AV Area (Vmean):   1.03 cm      PV Peak grad:  5.0 mmHg  AV Area (VTI):     1.03 cm  AV Vmax:           405.33 cm/s  AV Vmean:          299.667 cm/s  AV VTI:            0.817 m  AV Peak Grad:      65.7 mmHg  AV Mean Grad:      40.0 mmHg  LVOT Vmax:         121.00 cm/s  LVOT Vmean:        88.900 cm/s  LVOT VTI:          0.243 m  LVOT/AV VTI ratio: 0.30    AORTA  Ao Root diam: 4.00 cm  Ao Asc diam:  4.00 cm  Ao Arch diam: 4.5 cm   MITRAL VALVE  MV Area (PHT): 2.56 cm    SHUNTS  MV Area VTI:   3.55 cm    Systemic VTI:  0.24 m  MV Peak grad:  3.5 mmHg    Systemic Diam: 2.10 cm  MV Mean grad:  3.0 mmHg  MV Vmax:       0.93 m/s  MV Vmean:      77.6 cm/s  MV Decel Time: 296 msec  MV E velocity: 92.20 cm/s  MV A velocity: 80.70 cm/s  MV E/A ratio:  1.14  Charlton Haws MD  Electronically signed by Charlton Haws MD  Signature Date/Time: 12/22/2022/11:30:25 AM        Final      Physicians  Panel Physicians Referring Physician Case Authorizing Physician  Orbie Pyo, MD (Primary)     Procedures  RIGHT HEART CATH AND CORONARY ANGIOGRAPHY   Conclusion      Prox LAD to Mid LAD lesion is 40% stenosed.   Prox Cx to Mid Cx lesion is 20% stenosed.   Ost 2nd Mrg lesion is 20% stenosed.   Mid RCA lesion is 40% stenosed.   Prox Cx lesion is 20% stenosed.   1.  Mild to moderate diffuse obstructive coronary artery disease without high-grade obstructions. 2.  Fick cardiac output of  9.7 L/min and Fick cardiac index of 3.5 L/min/m with the following hemodynamics:            Right atrial pressure mean of 9 mmHg            Right ventricular pressure 36/4 with an end-diastolic pressure of 16 mmHg            Mean wedge pressure of 14 mmHg            Pulmonary artery pressure of 34/15 with a mean of 24 mmHg   Recommendation: Continue evaluation for aortic valve intervention.   Indications  Acute on chronic diastolic heart failure (HCC) [I50.33 (ICD-10-CM)]   Procedural Details  Technical Details The patient is a 67 year old male with a history of severe aortic stenosis, paroxysmal atrial fibrillation on Eliquis, chronic diastolic heart failure, obstructive sleep apnea, elevated BMI of 46, COPD, and seizure disorder who was admitted with acute on chronic diastolic heart failure.  He was diuresed close to 10 L.  He is referred for right heart catheterization and coronary angiography study as a preprocedural assessment prior to aortic valve intervention.  After obtaining consent the patient brought to the cardiac catheterization laboratory and prepped draped sterile fashion.  Previous placed antecubital IV was exchanged for a 5 [place] Terumo glide sheath.  Xylocaine was used to anesthetize the right radial site and a 6 [place] Terumo glide sheath was placed.  5000 units heparin 5 mg verapamil were administered through the sheath.  A 6 [place] Tig catheter was used for selective coronary angiography of the left system and a 6 [place] JR4 diagnostic catheter for selective coronary  very angiography of the right system.  A 5 French balloontipped catheter was used for right heart catheterization.  After review of the angiogram, no interventions were pursued.  Manual pressure was applied to the antecubital site and a TR band was placed.  There were no acute complications. Estimated blood loss <50 mL.   During this procedure medications were administered to achieve and maintain moderate  conscious sedation while the patient's heart rate, blood pressure, and oxygen saturation were continuously monitored and I was present face-to-face 100% of this time.   Medications (Filter: Administrations occurring from 1108 to 1223 on 01/26/23) midazolam (VERSED) injection (mg)  Total dose: 2 mg Date/Time Rate/Dose/Volume Action   01/26/23 1137 2 mg Given   fentaNYL (SUBLIMAZE) injection (mcg)  Total dose: 50 mcg Date/Time Rate/Dose/Volume Action   01/26/23 1137 50 mcg Given   lidocaine (PF) (XYLOCAINE) 1 % injection (mL)  Total volume: 5 mL Date/Time Rate/Dose/Volume Action   01/26/23 1141 5 mL Given   Radial Cocktail/Verapamil only (mL)  Total volume: 10 mL Date/Time Rate/Dose/Volume Action   01/26/23 1143 10 mL  Given   Heparin (Porcine) in NaCl 1000-0.9 UT/500ML-% SOLN (mL)  Total volume: 1,000 mL Date/Time Rate/Dose/Volume Action   01/26/23 1143 500 mL Given   1144 500 mL Given   heparin sodium (porcine) injection (Units)  Total dose: 5,000 Units Date/Time Rate/Dose/Volume Action   01/26/23 1143 5,000 Units Given   iohexol (OMNIPAQUE) 350 MG/ML injection (mL)  Total volume: 45 mL Date/Time Rate/Dose/Volume Action   01/26/23 1211 45 mL Given   allopurinol (ZYLOPRIM) tablet 200 mg (mg)  Total dose: Cannot be calculated* Dosing weight: 163.3 *Administration dose not documented Date/Time Rate/Dose/Volume Action   01/26/23 1108 *Not included in total MAR Hold   ARIPiprazole (ABILIFY) tablet 5 mg (mg)  Total dose: Cannot be calculated* *Administration dose not documented Date/Time Rate/Dose/Volume Action   01/26/23 1108 *Not included in total MAR Hold   atorvastatin (LIPITOR) tablet 40 mg (mg)  Total dose: Cannot be calculated* Dosing weight: 163.3 *Administration dose not documented Date/Time Rate/Dose/Volume Action   01/26/23 1108 *Not included in total MAR Hold   busPIRone (BUSPAR) tablet 10 mg (mg)  Total dose: Cannot be calculated* *Administration dose not  documented Date/Time Rate/Dose/Volume Action   01/26/23 1108 *Not included in total MAR Hold   dapagliflozin propanediol (FARXIGA) tablet 10 mg (mg)  Total dose: Cannot be calculated* *Administration dose not documented Date/Time Rate/Dose/Volume Action   01/26/23 1108 *Not included in total MAR Hold   diltiazem (CARDIZEM CD) 24 hr capsule 120 mg (mg)  Total dose: Cannot be calculated* Dosing weight: 163.3 *Administration dose not documented Date/Time Rate/Dose/Volume Action   01/26/23 1108 *Not included in total MAR Hold   fenofibrate tablet 160 mg (mg)  Total dose: Cannot be calculated* Dosing weight: 163.3 *Administration dose not documented Date/Time Rate/Dose/Volume Action   01/26/23 1108 *Not included in total MAR Hold   insulin aspart (novoLOG) injection 0-15 Units (Units)  Total dose: Cannot be calculated* Dosing weight: 163.3 *Administration dose not documented Date/Time Rate/Dose/Volume Action   01/26/23 1108 *Not included in total MAR Hold   1200 *Not included in total Automatically Held   insulin aspart (novoLOG) injection 0-5 Units (Units)  Total dose: Cannot be calculated* Dosing weight: 163.3 *Administration dose not documented Date/Time Rate/Dose/Volume Action   01/26/23 1108 *Not included in total MAR Hold   lacosamide (VIMPAT) tablet 150 mg (mg)  Total dose: Cannot be calculated* *Administration dose not documented Date/Time Rate/Dose/Volume Action   01/26/23 1108 *Not included in total MAR Hold   levETIRAcetam (KEPPRA) tablet 500 mg (mg)  Total dose: Cannot be calculated* *Administration dose not documented Date/Time Rate/Dose/Volume Action   01/26/23 1108 *Not included in total MAR Hold   melatonin tablet 3 mg (mg)  Total dose: Cannot be calculated* Dosing weight: 163.3 *Administration dose not documented Date/Time Rate/Dose/Volume Action   01/26/23 1108 *Not included in total MAR Hold   metoprolol succinate (TOPROL-XL) 24 hr tablet 50 mg (mg)  Total  dose: Cannot be calculated* Dosing weight: 164.2 *Administration dose not documented Date/Time Rate/Dose/Volume Action   01/26/23 1108 *Not included in total MAR Hold   mirtazapine (REMERON) tablet 15 mg (mg)  Total dose: Cannot be calculated* *Administration dose not documented Date/Time Rate/Dose/Volume Action   01/26/23 1108 *Not included in total MAR Hold   ondansetron (ZOFRAN) injection 4 mg (mg)  Total dose: Cannot be calculated* Dosing weight: 163.3 *Administration dose not documented Date/Time Rate/Dose/Volume Action   01/26/23 1108 *Not included in total MAR Hold   prazosin (MINIPRESS) capsule 2 mg (mg)  Total dose: Cannot be  calculated* *Administration dose not documented Date/Time Rate/Dose/Volume Action   01/26/23 1108 *Not included in total MAR Hold   pregabalin (LYRICA) capsule 150 mg (mg)  Total dose: Cannot be calculated* *Administration dose not documented Date/Time Rate/Dose/Volume Action   01/26/23 1108 *Not included in total MAR Hold   sodium chloride flush (NS) 0.9 % injection 3 mL (mL)  Total dose: Cannot be calculated* Dosing weight: 163.3 *Administration dose not documented Date/Time Rate/Dose/Volume Action   01/26/23 1108 *Not included in total MAR Hold   sodium chloride flush (NS) 0.9 % injection 3 mL (mL)  Total dose: Cannot be calculated* Dosing weight: 164.2 *Administration dose not documented Date/Time Rate/Dose/Volume Action   01/26/23 1108 *Not included in total MAR Hold   spironolactone (ALDACTONE) tablet 12.5 mg (mg)  Total dose: Cannot be calculated* Dosing weight: 163.3 *Administration dose not documented Date/Time Rate/Dose/Volume Action   01/26/23 1108 *Not included in total MAR Hold   traZODone (DESYREL) tablet 200 mg (mg)  Total dose: Cannot be calculated* Dosing weight: 163.3 *Administration dose not documented Date/Time Rate/Dose/Volume Action   01/26/23 1108 *Not included in total MAR Hold   venlafaxine XR (EFFEXOR-XR) 24 hr  capsule 225 mg (mg)  Total dose: Cannot be calculated* *Administration dose not documented Date/Time Rate/Dose/Volume Action   01/26/23 1108 *Not included in total MAR Hold   oxyCODONE (Oxy IR/ROXICODONE) immediate release tablet 5 mg (mg)  Total dose: Cannot be calculated* Dosing weight: 164.2 *Administration dose not documented Date/Time Rate/Dose/Volume Action   01/26/23 1108 *Not included in total MAR Hold   potassium chloride SA (KLOR-CON M) CR tablet 40 mEq (mEq)  Total dose: Cannot be calculated* Dosing weight: 164.2 *Administration dose not documented Date/Time Rate/Dose/Volume Action   01/26/23 1108 *Not included in total MAR Hold   albuterol (PROVENTIL) (2.5 MG/3ML) 0.083% nebulizer solution 2.5 mg (mg)  Total dose: Cannot be calculated* Dosing weight: 163.3 *Administration dose not documented Date/Time Rate/Dose/Volume Action   01/26/23 1108 *Not included in total MAR Hold   metolazone (ZAROXOLYN) tablet 2.5 mg (mg)  Total dose: Cannot be calculated* Dosing weight: 163.3 *Administration dose not documented Date/Time Rate/Dose/Volume Action   01/26/23 1108 *Not included in total MAR Hold    Sedation Time  Sedation Time Physician-1: 30 minutes 47 seconds Contrast     Administrations occurring from 1108 to 1223 on 01/26/23:  Medication Name Total Dose  iohexol (OMNIPAQUE) 350 MG/ML injection 45 mL   Radiation/Fluoro  Fluoro time: 5.7 (min) DAP: 32735 (mGycm2) Cumulative Air Kerma: 504 (mGy) Complications  Complications documented before study signed (01/26/2023 12:23 PM)   No complications were associated with this study.  Documented by Argie Ramming, RN - 01/26/2023 12:10 PM     Coronary Findings  Diagnostic Dominance: Right Left Main  Vessel is normal in caliber and large. Vessel is angiographically normal.    Left Anterior Descending  Vessel is normal in caliber. Moderate to Large Caliber There is mild diffuse disease throughout the vessel.  Prox  LAD to Mid LAD lesion is 40% stenosed. The lesion is focal, tubular and smooth.    First Diagonal Branch  Vessel is moderate in size. Vessel is angiographically normal.    Second Diagonal Branch  Vessel is large in size. Vessel is angiographically normal.    Third Diagonal Branch  Vessel is small in size. Vessel is angiographically normal.    Left Circumflex  Vessel is large. Vessel is angiographically normal. The vessel is moderately ectatic.  Prox Cx lesion is 20% stenosed.  Prox Cx to Mid Cx lesion is 20% stenosed.    First Obtuse Marginal Branch  Vessel is small in size. Vessel is angiographically normal.    Second Obtuse Marginal Branch  Vessel is moderate in size. Vessel is angiographically normal.  Ost 2nd Mrg lesion is 20% stenosed.    Right Coronary Artery  Vessel is normal in caliber and large. Very Large Ectatic vessel (especially in the distal 1/3) There is mild diffuse disease throughout the vessel. The vessel is moderately ectatic.  Mid RCA lesion is 40% stenosed.    Inferior Septal  Vessel is small in size. Vessel is angiographically normal.    Right Posterior Atrioventricular Artery  Vessel is moderate in size. Vessel is angiographically normal.    First Right Posterolateral Branch  Vessel is small in size. Vessel is angiographically normal.    Intervention   No interventions have been documented.   Coronary Diagrams  Diagnostic Dominance: Right  Intervention   Implants   No implant documentation for this case.   Syngo Images   Show images for CARDIAC CATHETERIZATION Images on Long Term Storage   Show images for Janel, Pontiff A "Ray" Link to Procedure Log  Procedure Log    Hemo Data  Flowsheet Row Most Recent Value  Fick Cardiac Output 9.71 L/min  Fick Cardiac Output Index 3.48 (L/min)/BSA  RA A Wave 10 mmHg  RA V Wave 10 mmHg  RA Mean 9 mmHg  RV Systolic Pressure 36 mmHg  RV Diastolic Pressure 4 mmHg  RV EDP 16 mmHg  PA  Systolic Pressure 34 mmHg  PA Diastolic Pressure 15 mmHg  PA Mean 24 mmHg  PW A Wave 19 mmHg  PW V Wave 19 mmHg  PW Mean 14 mmHg  AO Systolic Pressure 106 mmHg  AO Diastolic Pressure 68 mmHg  AO Mean 87 mmHg  QP/QS 0.87  TPVR Index 7.89 HRUI  TSVR Index 25.03 HRUI  PVR SVR Ratio 0.15  TPVR/TSVR Ratio 0.32   ADDENDUM REPORT: 01/30/2023 14:10   CLINICAL DATA:  20M with severe aortic stenosis being evaluated for a TAVR procedure.   EXAM: Cardiac TAVR CT   TECHNIQUE: The patient was scanned on a [employer]. A 120 kV retrospective scan was triggered in the descending thoracic aorta at 111 HU's. Gantry rotation speed was 250 msecs and collimation was .6 mm. No beta blockade or nitro were given. The 3D data set was reconstructed in 5% intervals of the R-R cycle. Systolic and diastolic phases were analyzed on a dedicated work station using MPR, MIP and VRT modes. The patient received 100 cc of contrast.   FINDINGS: Aortic Root:   Aortic valve: Trileaflet   Aortic annulus:   Diameter: 29mm x 27mm   Perimeter: 92mm   Area: 657 mm^2   Calcifications: No calcifications   Coronary height: Min Left - 16mm, Min Right - 17mm   Sinotubular height: Left cusp - 24mm; Right cusp - 27mm; Noncoronary cusp - 29mm   LVOT (as measured 3 mm below the annulus):   Diameter: 30mm x 27mm   Area: 735 mm^2   Calcifications: No calcifications   Aortic sinus width: Left cusp - 37mm; Right cusp - 37mm; Noncoronary cusp - 37mm   Sinotubular junction width: 34mm x 33mm   Optimum Fluoroscopic Angle for Delivery: RAO 5 CRA 10   IMPRESSION: 1. Poor contrast opacification, which affects accuracy of TAVR measurements   2.  Tricuspid aortic valve with severe calcifications   3. Aortic  annulus measures 29mm x 27mm in diameter with perimeter 92mm and area 657 mm^2. No annular or LVOT calcifications. Annular measurements are suitable for placement of 29mm Sapien 3 valve    4. Sufficient coronary to annulus distance.   5. Optimum Fluoroscopic Angle for Delivery:  RAO 5 CRA 10   6. See prior study on 01/27/23 for aortic valve and coronary calcium scores     Electronically Signed   By: Epifanio Lesches M.D.   On: 01/30/2023 14:10    Addended by Little Ishikawa, MD on 01/30/2023  2:12 PM    Study Result  Narrative & Impression  EXAM: OVER-READ INTERPRETATION  CT CHEST   The following report is a limited chest CT over-read performed by radiologist Dr. Duanne Guess of [facility] on 01/30/2023. This over-read does not include interpretation of cardiac or coronary anatomy or pathology. The coronary CTA interpretation by the cardiologist is attached.   COMPARISON:  01/27/2023   FINDINGS: Stable heart size. No pericardial effusion. Image thoracic aorta is nonaneurysmal. Aortic and coronary artery atherosclerosis. Central pulmonary vasculature within normal limits.   Mildly enlarged subcarinal and bilateral hilar lymph nodes, unchanged. Atelectatic changes within the dependent lung fields. Imaged lung fields are otherwise clear. No acute bony or chest wall abnormality.   IMPRESSION: 1. No new or acute extracardiac findings. 2. Mildly enlarged subcarinal and bilateral hilar lymph nodes, unchanged. 3. Aortic and coronary artery atherosclerosis (ICD10-I70.0).   Electronically Signed: By: Duanne Guess D.O. On: 01/30/2023 13:19       Impression:  This 67 year old gentleman has stage D, severe, symptomatic aortic stenosis admitted with NYHA class IV symptoms of worsening shortness of breath and chest discomfort with hypoxemic respiratory failure and marked volume overload consistent with acute on chronic diastolic congestive heart failure.  I have personally reviewed his 2D echocardiogram, cardiac catheterization, and CTA studies.  His echocardiogram showed a thickened and calcified aortic valve with restricted  leaflet mobility with a mean gradient of 40 mmHg and a peak gradient of 66 mmHg with a valve area by VTI of 1.03 cm.  Left ventricular ejection fraction was 60 to 65%.  Cardiac catheterization showed mild to moderate diffuse obstructive coronary disease without high-grade stenosis.  I agree that aortic valve replacement is indicated in this patient for relief of his symptoms and to prevent recurrent congestive heart failure and left ventricular deterioration.  Given his morbid obesity and multiple comorbid risk factors I do not think he would be a candidate for open surgical aortic valve replacement.  I think transcatheter aortic valve replacement would be a reasonable alternative.  His gated cardiac CTA shows anatomy suitable for TAVR using a 29 mm SAPIEN 3 valve.  His abdominal and pelvic CTA shows adequate pelvic vascular anatomy to allow transfemoral insertion.  The patient was counseled at length regarding treatment alternatives for management of severe symptomatic aortic stenosis. The risks and benefits of surgical intervention has been discussed in detail. Long-term prognosis with medical therapy was discussed. Alternative approaches such as conventional surgical aortic valve replacement, transcatheter aortic valve replacement, and palliative medical therapy were compared and contrasted at length. This discussion was placed in the context of the patient's own specific clinical presentation and past medical history. All of his questions have been addressed.   Following the decision to proceed with transcatheter aortic valve replacement, a discussion was held regarding what types of management strategies would be attempted intraoperatively in the event of life-threatening complications, including whether or not  the patient would be considered a candidate for the use of cardiopulmonary bypass and/or conversion to open sternotomy for attempted surgical intervention.  I do not think he is a candidate for  median sternotomy to manage any intraoperative complications.  The patient is aware of the fact that transient use of cardiopulmonary bypass may be necessary. The patient has been advised of a variety of complications that might develop including but not limited to risks of death, stroke, paravalvular leak, aortic dissection or other major vascular complications, aortic annulus rupture, device embolization, cardiac rupture or perforation, mitral regurgitation, acute myocardial infarction, arrhythmia, heart block or bradycardia requiring permanent pacemaker placement, congestive heart failure, respiratory failure, renal failure, pneumonia, infection, other late complications related to structural valve deterioration or migration, or other complications that might ultimately cause a temporary or permanent loss of functional independence or other long term morbidity. The patient provides full informed consent for the procedure as described and all questions were answered.      Plan:  He has been seen by Dr. Barbette Merino and will undergo dental extractions on Friday this week.  He will tentatively be scheduled for TAVR on Tuesday, 02/07/2023.   Alleen Borne, MD 02/02/2023 7:35 AM

## 2023-02-02 NOTE — Progress Notes (Signed)
PROGRESS NOTE    Lawrence [name]  NUU:[id number] DOB: 1956-06-01 DOA: 01/22/2023 PCP: [name], PA-C   Brief Narrative:  67 y.o. male with history of hypertension,CHF, PAF on Eliquis, aortic stenosis, CAD, CVA, diabetes mellitus type 2, COPD, AAA, prior history of tobacco abuse, morbid obesity, and OSA on CPAP presented with complaints of shortness of breath, intermittent left sided chest pain over the past 2 weeks. Patient being evaluated in the outpatient setting with tentatively planned right/left heart cath June 25. Hospitalist called for admission - cardiology called in consult.  Eventually patient was noted to have severe aortic stenosis with cardiology planning on TAVR 7/2.  Prior to this due to poor condition oral surgery was consulted who is planning on teeth extraction under general anesthesia on 6/28.   Assessment & Plan:  Principal Problem:   Acute hypoxic respiratory failure (HCC) Active Problems:   Chest pain   Chronic diastolic CHF (congestive heart failure) (HCC)   OSA (obstructive sleep apnea)   Type 2 diabetes mellitus with hyperlipidemia (HCC)   Essential hypertension   Seizure disorder (HCC)   AF (paroxysmal atrial fibrillation) (HCC)   COPD (chronic obstructive pulmonary disease) (HCC)      Acute respiratory failure with hypoxia and hypercapnia/dyspnea on exertion Exertional chest pain, ACS ruled out. Contributed by severe arctic stenosis leading to diastolic CHF.  CTA chest is negative for PE.  Continue bronchodilators.  LHC negative for obstructive disease   Acute on chronic Diastolic congestive heart failure Severe aortic stenosis Echocardiogram in May showed EF of 65% and severe aortic stenosis.  This is intermittently causing shortness of breath and chest pain type of symptoms.  TAVR planned 7/2 - Medical management in the meantime.   Hypokalemia: As needed repletion   COPD Continue home medications: Breo, Incruse, nebs   Type 2 diabetes  mellitus well-controlled with long-term use of insulin: HbA1c 6.4.  Sliding scale and Accu-Chek Marcelline Deist, Semglee. Add Novolog 3U Premeal   Paroxysmal atrial fibrillation  Currently on Eliquis.  Continue Cardizem.   Essential hypertension: Cardiac medications being adjusted by cardiology team. IV as needed   Anxiety and depression Continue as BuSpar, Abilify, venlafaxine   Seizure disorder Continue home Keppra and lacosamide   Obstructive sleep apnea Continue CPAP   Morbid obesity Estimated body mass index is 47.04 kg/m as calculated from the following:   Height as of this encounter: 6\' 2"  (1.88 m).   Weight as of this encounter: 166.2 kg.   DVT prophylaxis Eliquis Code Status:   Code Status: DNR Family Communication: Significant other on the phone Patient's symptoms are too severe to be discharged.  Will likely stay in the hospital until his TAVR on 7/2.  Also planning on dental extraction under general anesthesia on 6/28.     Diet Orders (From admission, onward)     Start     Ordered   01/26/23 1231  Diet heart healthy/carb modified Room service appropriate? Yes; Fluid consistency: Thin  Diet effective now       Question Answer Comment  Room service appropriate? Yes   Fluid consistency: Thin      01/26/23 1231            Subjective: Feeling ok, no complaints.   Examination: Constitutional: Not in acute distress, obese.  Respiratory: Clear to auscultation bilaterally Cardiovascular: Normal sinus rhythm, no rubs Abdomen: Nontender nondistended good bowel sounds Musculoskeletal: No edema noted Skin: No rashes seen Neurologic: CN 2-12 grossly intact.  And nonfocal Psychiatric: Normal  judgment and insight. Alert and oriented x 3. Normal mood.     Objective: Vitals:   02/01/23 2210 02/02/23 0505 02/02/23 0751 02/02/23 0758  BP:  122/68 107/61   Pulse: 79 69    Resp:  19 16   Temp:  98.3 F (36.8 C) 97.7 F (36.5 C)   TempSrc:  Oral Oral   SpO2: 94%  93%  93%  Weight:  (!) 162.8 kg    Height:        Intake/Output Summary (Last 24 hours) at 02/02/2023 0811 Last data filed at 02/01/2023 2200 Gross per 24 hour  Intake 180 ml  Output 1700 ml  Net -1520 ml   Filed Weights   01/31/23 0437 02/01/23 0620 02/02/23 0505  Weight: (!) 166.2 kg (!) (P) 166 kg (!) 162.8 kg    Scheduled Meds:  allopurinol  200 mg Oral BID   ARIPiprazole  5 mg Oral QHS   atorvastatin  40 mg Oral Daily   busPIRone  10 mg Oral TID   dapagliflozin propanediol  10 mg Oral Q breakfast   diclofenac Sodium  4 g Topical QID   diltiazem  120 mg Oral Daily   fenofibrate  160 mg Oral Daily   fluticasone furoate-vilanterol  1 puff Inhalation Daily   And   umeclidinium bromide  1 puff Inhalation Daily   insulin aspart  0-15 Units Subcutaneous TID WC   insulin aspart  0-5 Units Subcutaneous QHS   insulin glargine-yfgn  30 Units Subcutaneous Daily   lacosamide  150 mg Oral BID   levETIRAcetam  500 mg Oral BID   lidocaine  1 patch Transdermal Q24H   metoprolol succinate  50 mg Oral Daily   mirtazapine  15 mg Oral QHS   pantoprazole  40 mg Oral BID   potassium chloride  40 mEq Oral Q4H   prazosin  2 mg Oral QHS   pregabalin  150 mg Oral TID   sodium chloride flush  3 mL Intravenous Q12H   sodium chloride flush  3 mL Intravenous Q12H   spironolactone  12.5 mg Oral Daily   torsemide  20 mg Oral Daily   traZODone  200 mg Oral QHS   venlafaxine XR  225 mg Oral Q breakfast   Continuous Infusions:  Nutritional status     Body mass index is 46.09 kg/m.  Data Reviewed:   CBC: Recent Labs  Lab 01/26/23 1159 01/27/23 0238 01/28/23 0224 01/31/23 0147 02/02/23 0249  WBC  --  10.3 10.0 10.1 11.6*  HGB 15.3 14.2 13.7 12.6* 13.6  HCT 45.0 44.9 42.5 39.6 41.9  MCV  --  91.4 88.9 91.5 90.3  PLT  --  268 265 252 263   Basic Metabolic Panel: Recent Labs  Lab 01/29/23 0321 01/30/23 0236 01/31/23 0147 02/01/23 1401 02/02/23 0249  NA 135 136 138 134* 136   K 3.7 3.4* 3.6 3.5 3.3*  CL 100 100 103 96* 98  CO2 26 27 27 26 29   GLUCOSE 102* 101* 121* 175* 120*  BUN 18 15 16 20 17   CREATININE 0.82 0.76 0.86 0.97 0.86  CALCIUM 8.6* 8.6* 8.4* 8.9 8.7*  MG  --   --   --  1.9 2.0   GFR: Estimated Creatinine Clearance: 138.6 mL/min (by C-G formula based on SCr of 0.86 mg/dL). Liver Function Tests: No results for input(s): "AST", "ALT", "ALKPHOS", "BILITOT", "PROT", "ALBUMIN" in the last 168 hours. No results for input(s): "LIPASE", "AMYLASE" in the last 168  hours. No results for input(s): "AMMONIA" in the last 168 hours. Coagulation Profile: No results for input(s): "INR", "PROTIME" in the last 168 hours. Cardiac Enzymes: No results for input(s): "CKTOTAL", "CKMB", "CKMBINDEX", "TROPONINI" in the last 168 hours. BNP (last 3 results) No results for input(s): "PROBNP" in the last 8760 hours. HbA1C: No results for input(s): "HGBA1C" in the last 72 hours. CBG: Recent Labs  Lab 02/01/23 0746 02/01/23 1201 02/01/23 1635 02/01/23 2117 02/02/23 0740  GLUCAP 189* 157* 129* 172* 223*   Lipid Profile: No results for input(s): "CHOL", "HDL", "LDLCALC", "TRIG", "CHOLHDL", "LDLDIRECT" in the last 72 hours. Thyroid Function Tests: No results for input(s): "TSH", "T4TOTAL", "FREET4", "T3FREE", "THYROIDAB" in the last 72 hours. Anemia Panel: No results for input(s): "VITAMINB12", "FOLATE", "FERRITIN", "TIBC", "IRON", "RETICCTPCT" in the last 72 hours. Sepsis Labs: No results for input(s): "PROCALCITON", "LATICACIDVEN" in the last 168 hours.  Recent Results (from the past 240 hour(s))  Respiratory (~20 pathogens) panel by PCR     Status: None   Collection Time: 01/23/23  8:35 AM   Specimen: Nasopharyngeal Swab; Respiratory  Result Value Ref Range Status   Adenovirus NOT DETECTED NOT DETECTED Final   Coronavirus 229E NOT DETECTED NOT DETECTED Final    Comment: (NOTE) The Coronavirus on the Respiratory Panel, DOES NOT test for the novel   Coronavirus (2019 nCoV)    Coronavirus HKU1 NOT DETECTED NOT DETECTED Final   Coronavirus NL63 NOT DETECTED NOT DETECTED Final   Coronavirus OC43 NOT DETECTED NOT DETECTED Final   Metapneumovirus NOT DETECTED NOT DETECTED Final   Rhinovirus / Enterovirus NOT DETECTED NOT DETECTED Final   Influenza A NOT DETECTED NOT DETECTED Final   Influenza B NOT DETECTED NOT DETECTED Final   Parainfluenza Virus 1 NOT DETECTED NOT DETECTED Final   Parainfluenza Virus 2 NOT DETECTED NOT DETECTED Final   Parainfluenza Virus 3 NOT DETECTED NOT DETECTED Final   Parainfluenza Virus 4 NOT DETECTED NOT DETECTED Final   Respiratory Syncytial Virus NOT DETECTED NOT DETECTED Final   Bordetella pertussis NOT DETECTED NOT DETECTED Final   Bordetella Parapertussis NOT DETECTED NOT DETECTED Final   Chlamydophila pneumoniae NOT DETECTED NOT DETECTED Final   Mycoplasma pneumoniae NOT DETECTED NOT DETECTED Final    Comment: Performed at [facility], [address], [place], [place] [address]  SARS Coronavirus 2 by RT PCR (hospital order, performed in [facility] hospital lab) *cepheid single result test* Anterior Nasal Swab     Status: None   Collection Time: 01/23/23  8:35 AM   Specimen: Anterior Nasal Swab  Result Value Ref Range Status   SARS Coronavirus 2 by RT PCR NEGATIVE NEGATIVE Final    Comment: Performed at [facility], [address], [place], [place] [address]         Radiology Studies: No results found.         LOS: 10 days   Time spent= 35 mins    Jebadiah Imperato Joline Maxcy, MD [facility]  If 7PM-7AM, please contact night-coverage  02/02/2023, 8:11 AM

## 2023-02-03 ENCOUNTER — Inpatient Hospital Stay (HOSPITAL_COMMUNITY): Payer: Medicare HMO

## 2023-02-03 ENCOUNTER — Encounter (HOSPITAL_COMMUNITY)
Admission: EM | Disposition: A | Discharge status: Home / Self Care | Payer: Self-pay | Source: Home / Self Care | Attending: Internal Medicine

## 2023-02-03 ENCOUNTER — Other Ambulatory Visit: Payer: Self-pay

## 2023-02-03 ENCOUNTER — Encounter (HOSPITAL_COMMUNITY): Payer: Self-pay | Admitting: Internal Medicine

## 2023-02-03 DIAGNOSIS — J9601 Acute respiratory failure with hypoxia: Secondary | ICD-10-CM | POA: Diagnosis not present

## 2023-02-03 DIAGNOSIS — I110 Hypertensive heart disease with heart failure: Secondary | ICD-10-CM

## 2023-02-03 DIAGNOSIS — I2510 Atherosclerotic heart disease of native coronary artery without angina pectoris: Secondary | ICD-10-CM

## 2023-02-03 DIAGNOSIS — K029 Dental caries, unspecified: Secondary | ICD-10-CM

## 2023-02-03 DIAGNOSIS — I5033 Acute on chronic diastolic (congestive) heart failure: Secondary | ICD-10-CM

## 2023-02-03 DIAGNOSIS — K0850 Unsatisfactory restoration of tooth, unspecified: Secondary | ICD-10-CM

## 2023-02-03 HISTORY — PX: TOOTH EXTRACTION: SHX859

## 2023-02-03 LAB — BASIC METABOLIC PANEL
Anion gap: 10 (ref 5–15)
Anion gap: 9 (ref 5–15)
BUN: 22 mg/dL (ref 8–23)
BUN: 17 mg/dL (ref 8–23)
CO2: 28 mmol/L (ref 22–32)
CO2: 28 mmol/L (ref 22–32)
Calcium: 8.7 mg/dL — ABNORMAL LOW (ref 8.9–10.3)
Calcium: 8.9 mg/dL (ref 8.9–10.3)
Chloride: 99 mmol/L (ref 98–111)
Chloride: 98 mmol/L (ref 98–111)
Creatinine, Ser: 1.05 mg/dL (ref 0.61–1.24)
Creatinine, Ser: 0.93 mg/dL (ref 0.61–1.24)
GFR, Estimated: >60 mL/min (ref >60)
GFR, Estimated: >60 mL/min (ref >60)
Glucose, Bld: 131 mg/dL — ABNORMAL HIGH (ref 70–99)
Glucose, Bld: 208 mg/dL — ABNORMAL HIGH (ref 70–99)
Potassium: 3.1 mmol/L — ABNORMAL LOW (ref 3.5–5.1)
Potassium: 4 mmol/L (ref 3.5–5.1)
Sodium: 137 mmol/L (ref 135–145)
Sodium: 135 mmol/L (ref 135–145)

## 2023-02-03 LAB — GLUCOSE, CAPILLARY
Glucose-Capillary: 134 mg/dL — ABNORMAL HIGH (ref 70–99)
Glucose-Capillary: 151 mg/dL — ABNORMAL HIGH (ref 70–99)
Glucose-Capillary: 206 mg/dL — ABNORMAL HIGH (ref 70–99)
Glucose-Capillary: 230 mg/dL — ABNORMAL HIGH (ref 70–99)
Glucose-Capillary: 231 mg/dL — ABNORMAL HIGH (ref 70–99)

## 2023-02-03 LAB — CBC
HCT: 41.6 % (ref 39.0–52.0)
Hemoglobin: 13.4 g/dL (ref 13.0–17.0)
MCH: 29.3 pg (ref 26.0–34.0)
MCHC: 32.2 g/dL (ref 30.0–36.0)
MCV: 91 fL (ref 80.0–100.0)
Platelets: 265 10*3/uL (ref 150–400)
RBC: 4.57 MIL/uL (ref 4.22–5.81)
RDW: 15.1 % (ref 11.5–15.5)
WBC: 10.4 10*3/uL (ref 4.0–10.5)
nRBC: 0 % (ref 0.0–0.2)

## 2023-02-03 LAB — MAGNESIUM: Magnesium: 2 mg/dL (ref 1.7–2.4)

## 2023-02-03 LAB — SURGICAL PCR SCREEN
MRSA, PCR: NEGATIVE
Staphylococcus aureus: POSITIVE — AB

## 2023-02-03 SURGERY — DENTAL RESTORATION/EXTRACTIONS
Anesthesia: General

## 2023-02-03 MED ORDER — LIDOCAINE 2% (20 MG/ML) 5 ML SYRINGE
INTRAMUSCULAR | Status: DC | PRN
  Administered 2023-02-03: 100 mg via INTRAVENOUS

## 2023-02-03 MED ORDER — LIDOCAINE-EPINEPHRINE 2 %-1:100000 IJ SOLN
INTRAMUSCULAR | Status: AC
  Filled 2023-02-03: qty 1

## 2023-02-03 MED ORDER — PROPOFOL 10 MG/ML IV BOLUS
INTRAVENOUS | Status: DC | PRN
  Administered 2023-02-03: 80 mg via INTRAVENOUS

## 2023-02-03 MED ORDER — DEXAMETHASONE SODIUM PHOSPHATE 10 MG/ML IJ SOLN
INTRAMUSCULAR | Status: AC
  Filled 2023-02-03: qty 1

## 2023-02-03 MED ORDER — MUPIROCIN 2 % EX OINT
1.0000 | TOPICAL_OINTMENT | Freq: Two times a day (BID) | CUTANEOUS | Status: AC
  Administered 2023-02-03 – 2023-02-06 (×8): 1 via NASAL
  Filled 2023-02-03: qty 22

## 2023-02-03 MED ORDER — SUCCINYLCHOLINE CHLORIDE 200 MG/10ML IV SOSY
PREFILLED_SYRINGE | INTRAVENOUS | Status: AC
  Filled 2023-02-03: qty 10

## 2023-02-03 MED ORDER — SUCCINYLCHOLINE CHLORIDE 200 MG/10ML IV SOSY
PREFILLED_SYRINGE | INTRAVENOUS | Status: DC | PRN
  Administered 2023-02-03: 140 mg via INTRAVENOUS

## 2023-02-03 MED ORDER — ONDANSETRON HCL 4 MG/2ML IJ SOLN
INTRAMUSCULAR | Status: DC | PRN
  Administered 2023-02-03: 4 mg via INTRAVENOUS

## 2023-02-03 MED ORDER — FENTANYL CITRATE (PF) 250 MCG/5ML IJ SOLN
INTRAMUSCULAR | Status: AC
  Filled 2023-02-03: qty 5

## 2023-02-03 MED ORDER — CEFAZOLIN IN SODIUM CHLORIDE 3-0.9 GM/100ML-% IV SOLN
3.0000 g | INTRAVENOUS | Status: AC
  Administered 2023-02-03: 3 g via INTRAVENOUS

## 2023-02-03 MED ORDER — FENTANYL CITRATE (PF) 250 MCG/5ML IJ SOLN
INTRAMUSCULAR | Status: DC | PRN
  Administered 2023-02-03: 75 ug via INTRAVENOUS
  Administered 2023-02-03: 50 ug via INTRAVENOUS

## 2023-02-03 MED ORDER — LIDOCAINE 2% (20 MG/ML) 5 ML SYRINGE
INTRAMUSCULAR | Status: AC
  Filled 2023-02-03: qty 5

## 2023-02-03 MED ORDER — METOLAZONE 5 MG PO TABS
2.5000 mg | ORAL_TABLET | Freq: Once | ORAL | Status: AC
  Administered 2023-02-03: 2.5 mg via ORAL
  Filled 2023-02-03: qty 1

## 2023-02-03 MED ORDER — PHENYLEPHRINE 80 MCG/ML (10ML) SYRINGE FOR IV PUSH (FOR BLOOD PRESSURE SUPPORT)
PREFILLED_SYRINGE | INTRAVENOUS | Status: DC | PRN
  Administered 2023-02-03: 80 ug via INTRAVENOUS

## 2023-02-03 MED ORDER — DEXAMETHASONE SODIUM PHOSPHATE 10 MG/ML IJ SOLN
INTRAMUSCULAR | Status: DC | PRN
  Administered 2023-02-03: 10 mg via INTRAVENOUS

## 2023-02-03 MED ORDER — MIDAZOLAM HCL 2 MG/2ML IJ SOLN
INTRAMUSCULAR | Status: AC
  Filled 2023-02-03: qty 2

## 2023-02-03 MED ORDER — PHENYLEPHRINE HCL-NACL 20-0.9 MG/250ML-% IV SOLN
INTRAVENOUS | Status: DC | PRN
  Administered 2023-02-03: 30 ug/min via INTRAVENOUS

## 2023-02-03 MED ORDER — SODIUM CHLORIDE 0.9 % IR SOLN
Status: DC | PRN
  Administered 2023-02-03: 1

## 2023-02-03 MED ORDER — ONDANSETRON HCL 4 MG/2ML IJ SOLN
INTRAMUSCULAR | Status: AC
  Filled 2023-02-03: qty 2

## 2023-02-03 MED ORDER — MIDAZOLAM HCL 2 MG/2ML IJ SOLN
INTRAMUSCULAR | Status: DC | PRN
  Administered 2023-02-03: 2 mg via INTRAVENOUS

## 2023-02-03 MED ORDER — PROPOFOL 10 MG/ML IV BOLUS
INTRAVENOUS | Status: AC
  Filled 2023-02-03: qty 20

## 2023-02-03 MED ORDER — LACTATED RINGERS IV SOLN: INTRAVENOUS | Status: DC | PRN

## 2023-02-03 MED ORDER — PHENYLEPHRINE 80 MCG/ML (10ML) SYRINGE FOR IV PUSH (FOR BLOOD PRESSURE SUPPORT)
PREFILLED_SYRINGE | INTRAVENOUS | Status: AC
  Filled 2023-02-03: qty 10

## 2023-02-03 MED ORDER — OXYCODONE HCL 5 MG PO TABS
5.0000 mg | ORAL_TABLET | Freq: Four times a day (QID) | ORAL | Status: DC | PRN
  Administered 2023-02-03 – 2023-02-08 (×14): 5 mg via ORAL
  Filled 2023-02-03 (×15): qty 1

## 2023-02-03 MED ORDER — 0.9 % SODIUM CHLORIDE (POUR BTL) OPTIME
TOPICAL | Status: DC | PRN
  Administered 2023-02-03: 1000 mL

## 2023-02-03 MED ORDER — POTASSIUM CHLORIDE CRYS ER 20 MEQ PO TBCR
60.0000 meq | EXTENDED_RELEASE_TABLET | Freq: Every day | ORAL | Status: DC
  Administered 2023-02-04 – 2023-02-06 (×3): 60 meq via ORAL
  Filled 2023-02-03 (×3): qty 3

## 2023-02-03 MED ORDER — LIDOCAINE-EPINEPHRINE 2 %-1:100000 IJ SOLN
INTRAMUSCULAR | Status: DC | PRN
  Administered 2023-02-03: 17 mL via INTRADERMAL

## 2023-02-03 SURGICAL SUPPLY — 37 items
BAG COUNTER SPONGE SURGICOUNT (BAG) IMPLANT
BAG SPNG CNTER NS LX DISP (BAG)
BLADE SURG 15 STRL LF DISP TIS (BLADE) ×1 IMPLANT
BLADE SURG 15 STRL SS (BLADE)
BUR CROSS CUT FISSURE 1.6 (BURR) ×1 IMPLANT
BUR EGG ELITE 4.0 (BURR) ×1 IMPLANT
CANISTER SUCT 3000ML PPV (MISCELLANEOUS) ×1 IMPLANT
COVER SURGICAL LIGHT HANDLE (MISCELLANEOUS) ×1 IMPLANT
GAUZE PACKING FOLDED 2 STR (GAUZE/BANDAGES/DRESSINGS) ×1 IMPLANT
GLOVE BIO SURGEON STRL SZ 6.5 (GLOVE) IMPLANT
GLOVE BIO SURGEON STRL SZ7 (GLOVE) IMPLANT
GLOVE BIO SURGEON STRL SZ8 (GLOVE) ×1 IMPLANT
GLOVE BIOGEL PI IND STRL 6.5 (GLOVE) IMPLANT
GLOVE BIOGEL PI IND STRL 7.0 (GLOVE) IMPLANT
GOWN STRL REUS W/ TWL LRG LVL3 (GOWN DISPOSABLE) ×1 IMPLANT
GOWN STRL REUS W/ TWL XL LVL3 (GOWN DISPOSABLE) ×1 IMPLANT
GOWN STRL REUS W/TWL LRG LVL3 (GOWN DISPOSABLE)
GOWN STRL REUS W/TWL XL LVL3 (GOWN DISPOSABLE) ×1
IV NS 1000ML (IV SOLUTION)
IV NS 1000ML BAXH (IV SOLUTION) ×1 IMPLANT
IV NS 250ML (IV SOLUTION) ×1
IV NS 250ML BAXH (IV SOLUTION) IMPLANT
KIT BASIN OR (CUSTOM PROCEDURE TRAY) ×1 IMPLANT
KIT TURNOVER KIT B (KITS) ×1 IMPLANT
NDL HYPO 25GX1X1/2 BEV (NEEDLE) ×2 IMPLANT
NEEDLE HYPO 25GX1X1/2 BEV (NEEDLE) ×1 IMPLANT
NS IRRIG 1000ML POUR BTL (IV SOLUTION) ×1 IMPLANT
PAD ARMBOARD 7.5X6 YLW CONV (MISCELLANEOUS) ×1 IMPLANT
SLEEVE IRRIGATION ELITE 7 (MISCELLANEOUS) ×1 IMPLANT
SPIKE FLUID TRANSFER (MISCELLANEOUS) ×1 IMPLANT
SPONGE SURGIFOAM ABS GEL 12-7 (HEMOSTASIS) IMPLANT
SUT CHROMIC 3 0 PS 2 (SUTURE) ×1 IMPLANT
SYR BULB IRRIG 60ML STRL (SYRINGE) ×1 IMPLANT
SYR CONTROL 10ML LL (SYRINGE) ×1 IMPLANT
TRAY ENT MC OR (CUSTOM PROCEDURE TRAY) ×1 IMPLANT
TUBING IRRIGATION (MISCELLANEOUS) ×1 IMPLANT
YANKAUER SUCT BULB TIP NO VENT (SUCTIONS) ×1 IMPLANT

## 2023-02-03 NOTE — Op Note (Signed)
02/03/2023  9:07 AM  PATIENT:  Lawrence [name]  67 y.o. male  PRE-OPERATIVE DIAGNOSIS:  nonrestorable teeth # 2, 3, 5, 7, 13, 14, 19, 24, 25, 29, 30, 31 secondary to dental caries  POST-OPERATIVE DIAGNOSIS:  SAME  PROCEDURE:  Procedure(s): EXTRACTION teeth # 2, 3, 5, 7, 13, 14, 19, 24, 25, 29, 30, 31   SURGEON:  Surgeon(s): [name], DMD  ANESTHESIA:   local and general  EBL:  minimal  DRAINS: none   SPECIMEN:  No Specimen  COUNTS:  YES  PLAN OF CARE: Discharge to home after PACU  PATIENT DISPOSITION:  PACU - hemodynamically stable.   PROCEDURE DETAILS: Dictation # [id number]  Georgia Lopes, DMD 02/03/2023 9:07 AM

## 2023-02-03 NOTE — Progress Notes (Signed)
 Rounding Note    Patient Name: Lawrence [name] Date of Encounter: 02/04/2023  [facility] Cardiologist: Gaylyn Keas, MD   Subjective   Complains of some pain in his mouth after his oral surgery Continues to have intermittent chest pain. States he does not want to go home prior to his surgery as he is worried he will come back  Inpatient Medications    Scheduled Meds:  allopurinol   200 mg Oral BID   ARIPiprazole   5 mg Oral QHS   atorvastatin   40 mg Oral Daily   busPIRone   10 mg Oral TID   diclofenac  Sodium  4 g Topical QID   diltiazem   120 mg Oral Daily   fenofibrate   160 mg Oral Daily   fluticasone  furoate-vilanterol  1 puff Inhalation Daily   And   umeclidinium bromide   1 puff Inhalation Daily   insulin  aspart  0-15 Units Subcutaneous TID WC   insulin  aspart  0-5 Units Subcutaneous QHS   insulin  aspart  3 Units Subcutaneous TID WC   insulin  glargine-yfgn  30 Units Subcutaneous Daily   lacosamide   150 mg Oral BID   levETIRAcetam   500 mg Oral BID   lidocaine   1 patch Transdermal Q24H   metoprolol  succinate  50 mg Oral Daily   mirtazapine   15 mg Oral QHS   mupirocin  ointment  1 Application Nasal BID   pantoprazole   40 mg Oral BID   potassium chloride   60 mEq Oral Daily   prazosin   2 mg Oral QHS   pregabalin   150 mg Oral TID   spironolactone   12.5 mg Oral Daily   torsemide   20 mg Oral Daily   traZODone   200 mg Oral QHS   venlafaxine  XR  225 mg Oral Q breakfast   Continuous Infusions:  PRN Meds: acetaminophen  **OR** acetaminophen , guaiFENesin , hydrALAZINE , ipratropium-albuterol , melatonin, morphine  injection, ondansetron  (ZOFRAN ) IV, oxyCODONE , senna-docusate   Vital Signs    Vitals:   02/03/23 1013 02/03/23 2008 02/04/23 0503 02/04/23 0816  BP: (!) 107/59 110/83 (!) 119/41 (!) 107/55  Pulse: 78 92 78   Resp:  18 20   Temp: (!) 97.5 F (36.4 C) 98 F (36.7 C) 97.7 F (36.5 C)   TempSrc: Axillary Axillary Oral   SpO2: 94% 94% 90%   Weight:    (!) 162.2 kg   Height:        Intake/Output Summary (Last 24 hours) at 02/04/2023 0825 Last data filed at 02/04/2023 0159 Gross per 24 hour  Intake 960 ml  Output 4220 ml  Net -3260 ml      02/04/2023    5:03 AM 02/03/2023    4:44 AM 02/02/2023    5:05 AM  Last 3 Weights  Weight (lbs) 357 lb 9.6 oz 359 lb 11.2 oz 359 lb  Weight (kg) 162.206 kg 163.159 kg 162.841 kg      Telemetry    NSR, frequent PVCs - Personally Reviewed  ECG    No new tracing - Personally Reviewed  Physical Exam   GEN: No acute distress.   Neck: No JVD Cardiac: RRR, 2/6 systolic murmur  Respiratory: Clear to auscultation bilaterally. GI: Morbidly obese, soft, nontender, non-distended  MS: Trace edema, chronic venous stasis changes Neuro:  Nonfocal  Psych: Normal affect   Labs    High Sensitivity Troponin:   Recent Labs  Lab 01/22/23 2054 01/22/23 2348  TROPONINIHS 8 8     Chemistry Recent Labs  Lab 02/02/23 0249 02/02/23 1417 02/03/23 0244  02/03/23 1429 02/04/23 0218  NA 136   < > 137 135 135  K 3.3*   < > 3.1* 4.0 3.5  CL 98   < > 99 98 97*  CO2 29   < > 28 28 28   GLUCOSE 120*   < > 131* 208* 211*  BUN 17   < > 22 17 20   CREATININE 0.86   < > 1.05 0.93 0.93  CALCIUM  8.7*   < > 8.7* 8.9 9.0  MG 2.0  --  2.0  --  2.1  GFRNONAA >60   < > >60 >60 >60  ANIONGAP 9   < > 10 9 10    < > = values in this interval not displayed.    Lipids No results for input(s): "CHOL", "TRIG", "HDL", "LABVLDL", "LDLCALC", "CHOLHDL" in the last 168 hours.  Hematology Recent Labs  Lab 02/02/23 0249 02/03/23 0244 02/04/23 0218  WBC 11.6* 10.4 14.8*  RBC 4.64 4.57 4.81  HGB 13.6 13.4 14.1  HCT 41.9 41.6 43.2  MCV 90.3 91.0 89.8  MCH 29.3 29.3 29.3  MCHC 32.5 32.2 32.6  RDW 15.2 15.1 14.9  PLT 263 265 285   Thyroid  No results for input(s): "TSH", "FREET4" in the last 168 hours.  BNPNo results for input(s): "BNP", "PROBNP" in the last 168 hours.  DDimer No results for input(s): "DDIMER" in the  last 168 hours.   Radiology    No results found.  Cardiac Studies   Cardiac Studies & Procedures   CARDIAC CATHETERIZATION  CARDIAC CATHETERIZATION 01/26/2023  Narrative   Prox LAD to Mid LAD lesion is 40% stenosed.   Prox Cx to Mid Cx lesion is 20% stenosed.   Ost 2nd Mrg lesion is 20% stenosed.   Mid RCA lesion is 40% stenosed.   Prox Cx lesion is 20% stenosed.  1.  Mild to moderate diffuse obstructive coronary artery disease without high-grade obstructions. 2.  Fick cardiac output of 9.7 L/min and Fick cardiac index of 3.5 L/min/m with the following hemodynamics: Right atrial pressure mean of 9 mmHg Right ventricular pressure 36/4 with an end-diastolic pressure of 16 mmHg Mean wedge pressure of 14 mmHg Pulmonary artery pressure of 34/15 with a mean of 24 mmHg  Recommendation: Continue evaluation for aortic valve intervention.  Findings Coronary Findings Diagnostic  Dominance: Right  Left Main Vessel is normal in caliber and large. Vessel is angiographically normal.  Left Anterior Descending Vessel is normal in caliber. Moderate to Large Caliber There is mild diffuse disease throughout the vessel. Prox LAD to Mid LAD lesion is 40% stenosed. The lesion is focal, tubular and smooth.  First Diagonal Branch Vessel is moderate in size. Vessel is angiographically normal.  Second Diagonal Branch Vessel is large in size. Vessel is angiographically normal.  Third Diagonal Branch Vessel is small in size. Vessel is angiographically normal.  Left Circumflex Vessel is large. Vessel is angiographically normal. The vessel is moderately ectatic. Prox Cx lesion is 20% stenosed. Prox Cx to Mid Cx lesion is 20% stenosed.  First Obtuse Marginal Branch Vessel is small in size. Vessel is angiographically normal.  Second Obtuse Marginal Branch Vessel is moderate in size. Vessel is angiographically normal. Ost 2nd Mrg lesion is 20% stenosed.  Right Coronary Artery Vessel is  normal in caliber and large. Very Large Ectatic vessel (especially in the distal 1/3) There is mild diffuse disease throughout the vessel. The vessel is moderately ectatic. Mid RCA lesion is 40% stenosed.  Inferior Septal Vessel is  small in size. Vessel is angiographically normal.  Right Posterior Atrioventricular Artery Vessel is moderate in size. Vessel is angiographically normal.  First Right Posterolateral Branch Vessel is small in size. Vessel is angiographically normal.  Intervention  No interventions have been documented.   CARDIAC CATHETERIZATION  CARDIAC CATHETERIZATION 08/26/2021  Narrative 1. Elevated R>L heart filling pressures. 2. Preserved cardiac output. 3. Pulmonary venous hypertension 4. Successful Cardiomems pressure sensor deployment.  I will increase torsemide  to 80 qam/60 qpm and increase KCl to 40 bid.  He will need to start Eliquis  tomorrow morning.   STRESS TESTS  MYOCARDIAL PERFUSION IMAGING 11/26/2020  Narrative  Nuclear stress EF: 55%.  The left ventricular ejection fraction is normal (55-65%).  There was no ST segment deviation noted during stress.  The study is normal.  This is a low risk study.   ECHOCARDIOGRAM  ECHOCARDIOGRAM COMPLETE 12/22/2022  Narrative ECHOCARDIOGRAM REPORT    Patient Name:   GARBRIEL HEBER Date of Exam: 12/22/2022 Medical Rec #:  [id number]        Height:       74.0 in Accession #:    [id number]       Weight:       360.0 lb Date of Birth:  21-Oct-1955        BSA:          2.790 m Patient Age:    66 years         BP:           100/60 mmHg Patient Gender: M                HR:           89 bpm. Exam Location:  Outpatient  Procedure: 2D Echo, Color Doppler and Cardiac Doppler  Indications:    I35.0 Aortic Stenosis  History:        Patient has prior history of Echocardiogram examinations. Asthma and COPD, Aortic Stenosis; Risk Factors:Diabetes, Hypertension and Sleep Apnea.  Sonographer:    L.  Thornton-Maynard Referring Phys: [id number] DALTON S MCLEAN  IMPRESSIONS   1. Left ventricular ejection fraction, by estimation, is 60 to 65%. The left ventricle has normal function. The left ventricle has no regional wall motion abnormalities. There is mild left ventricular hypertrophy. Left ventricular diastolic parameters were normal. 2. Right ventricular systolic function is normal. The right ventricular size is normal. 3. Left atrial size was moderately dilated. 4. The mitral valve is abnormal. No evidence of mitral valve regurgitation. No evidence of mitral stenosis. 5. AS has progressed since TTE done 05/2022. The aortic valve was not well visualized. There is moderate calcification of the aortic valve. There is moderate thickening of the aortic valve. Aortic valve regurgitation is not visualized. Severe aortic valve stenosis. 6. Aortic dilatation noted. There is mild dilatation of the aortic root, measuring 38 mm. There is mild dilatation of the ascending aorta, measuring 40 mm. 7. The inferior vena cava is normal in size with greater than 50% respiratory variability, suggesting right atrial pressure of 3 mmHg.  FINDINGS Left Ventricle: Left ventricular ejection fraction, by estimation, is 60 to 65%. The left ventricle has normal function. The left ventricle has no regional wall motion abnormalities. The left ventricular internal cavity size was normal in size. There is mild left ventricular hypertrophy. Left ventricular diastolic parameters were normal.  Right Ventricle: The right ventricular size is normal. No increase in right ventricular wall thickness. Right ventricular systolic function is normal.  Left Atrium:  Left atrial size was moderately dilated.  Right Atrium: Right atrial size was normal in size.  Pericardium: There is no evidence of pericardial effusion.  Mitral Valve: The mitral valve is abnormal. There is mild thickening of the mitral valve leaflet(s). There is mild  calcification of the mitral valve leaflet(s). Mild mitral annular calcification. No evidence of mitral valve regurgitation. No evidence of mitral valve stenosis. MV peak gradient, 3.5 mmHg. The mean mitral valve gradient is 3.0 mmHg.  Tricuspid Valve: The tricuspid valve is normal in structure. Tricuspid valve regurgitation is not demonstrated. No evidence of tricuspid stenosis.  Aortic Valve: AS has progressed since TTE done 05/2022. The aortic valve was not well visualized. There is moderate calcification of the aortic valve. There is moderate thickening of the aortic valve. Aortic valve regurgitation is not visualized. Severe aortic stenosis is present. Aortic valve mean gradient measures 40.0 mmHg. Aortic valve peak gradient measures 65.7 mmHg. Aortic valve area, by VTI measures 1.03 cm.  Pulmonic Valve: The pulmonic valve was normal in structure. Pulmonic valve regurgitation is not visualized. No evidence of pulmonic stenosis.  Aorta: Aortic dilatation noted. There is mild dilatation of the aortic root, measuring 38 mm. There is mild dilatation of the ascending aorta, measuring 40 mm.  Venous: The inferior vena cava is normal in size with greater than 50% respiratory variability, suggesting right atrial pressure of 3 mmHg.  IAS/Shunts: No atrial level shunt detected by color flow Doppler.   LEFT VENTRICLE PLAX 2D LVIDd:         6.20 cm     Diastology LVIDs:         4.90 cm     LV e' medial:    5.66 cm/s LV PW:         1.20 cm     LV E/e' medial:  16.3 LV IVS:        1.30 cm     LV e' lateral:   7.62 cm/s LVOT diam:     2.10 cm     LV E/e' lateral: 12.1 LV SV:         84 LV SV Index:   30 LVOT Area:     3.46 cm  LV Volumes (MOD) LV vol d, MOD A2C: 60.4 ml LV vol d, MOD A4C: 89.7 ml LV vol s, MOD A2C: 17.0 ml LV vol s, MOD A4C: 22.3 ml LV SV MOD A2C:     43.4 ml LV SV MOD A4C:     89.7 ml LV SV MOD BP:      54.7 ml  RIGHT VENTRICLE             IVC RV S prime:     12.20 cm/s   IVC diam: 2.20 cm TAPSE (M-mode): 3.0 cm  LEFT ATRIUM             Index LA diam:        4.30 cm 1.54 cm/m LA Vol (A2C):   28.9 ml 10.36 ml/m LA Vol (A4C):   80.6 ml 28.89 ml/m LA Biplane Vol: 50.4 ml 18.06 ml/m AORTIC VALVE                     PULMONIC VALVE AV Area (Vmax):    1.03 cm      PV Vmax:       1.12 m/s AV Area (Vmean):   1.03 cm      PV Peak grad:  5.0 mmHg AV Area (VTI):  1.03 cm AV Vmax:           405.33 cm/s AV Vmean:          299.667 cm/s AV VTI:            0.817 m AV Peak Grad:      65.7 mmHg AV Mean Grad:      40.0 mmHg LVOT Vmax:         121.00 cm/s LVOT Vmean:        88.900 cm/s LVOT VTI:          0.243 m LVOT/AV VTI ratio: 0.30  AORTA Ao Root diam: 4.00 cm Ao Asc diam:  4.00 cm Ao Arch diam: 4.5 cm  MITRAL VALVE MV Area (PHT): 2.56 cm    SHUNTS MV Area VTI:   3.55 cm    Systemic VTI:  0.24 m MV Peak grad:  3.5 mmHg    Systemic Diam: 2.10 cm MV Mean grad:  3.0 mmHg MV Vmax:       0.93 m/s MV Vmean:      77.6 cm/s MV Decel Time: 296 msec MV E velocity: 92.20 cm/s MV A velocity: 80.70 cm/s MV E/A ratio:  1.14  Janelle Mediate MD Electronically signed by Janelle Mediate MD Signature Date/Time: 12/22/2022/11:30:25 AM    Final   TEE  ECHO TEE 08/26/2021  Narrative TRANSESOPHOGEAL ECHO REPORT    Patient Name:   [name] Date of Exam: 08/26/2021 Medical Rec #:  [id number]        Height:       74.0 in Accession #:    [id number]       Weight:       384.0 lb Date of Birth:  11-06-1955        BSA:          2.868 m Patient Age:    65 years         BP:           108/60 mmHg Patient Gender: M                HR:           90 bpm. Exam Location:  Inpatient  Procedure: Transesophageal Echo, Cardiac Doppler and Color Doppler  Indications:     Aortic Stenosis  History:         Patient has prior history of Echocardiogram examinations, most recent 08/10/2021. COPD, Signs/Symptoms:Shortness of Breath; Risk Factors:Diabetes, Sleep Apnea and  Hypertension.  Sonographer:     Ruta Cousins RDCS Referring Phys:  [id number] Jolinda Necessary [facility] Diagnosing Phys: Archer Bear  PROCEDURE: After discussion of the risks and benefits of a TEE, an informed consent was obtained from the patient. The transesophogeal probe was passed without difficulty through the esophogus of the patient. Local oropharyngeal anesthetic was provided with Cetacaine. Sedation performed by different physician. The patient was monitored while under deep sedation. Anesthestetic sedation was provided intravenously by Anesthesiology: 464.21mg  of Propofol , 100mg  of Lidocaine . The patient's vital signs; including heart rate, blood pressure, and oxygen saturation; remained stable throughout the procedure. The patient developed no complications during the procedure.  IMPRESSIONS   1. Left ventricular ejection fraction, by estimation, is 60 to 65%. The left ventricle has normal function. The left ventricle has no regional wall motion abnormalities. There is mild left ventricular hypertrophy. 2. Right ventricular systolic function is normal. The right ventricular size is mildly enlarged. Tricuspid regurgitation signal is inadequate for assessing PA pressure. 3. The  aortic valve is tricuspid. Aortic valve regurgitation is mild. Moderate aortic valve stenosis. Aortic valve area, by VTI measures 1.35 cm and by planimetry 1.39 cm^2. Aortic valve mean gradient measures 28.0 mmHg. 4. The mitral valve is normal in structure. Trivial mitral valve regurgitation. No evidence of mitral stenosis. 5. Left atrial size was mildly dilated. No left atrial/left atrial appendage thrombus was detected. 6. Right atrial size was mildly dilated. 7. No PFO or ASD by color doppler.  FINDINGS Left Ventricle: Left ventricular ejection fraction, by estimation, is 60 to 65%. The left ventricle has normal function. The left ventricle has no regional wall motion abnormalities. The left ventricular internal  cavity size was normal in size. There is mild left ventricular hypertrophy.  Right Ventricle: The right ventricular size is mildly enlarged. No increase in right ventricular wall thickness. Right ventricular systolic function is normal. Tricuspid regurgitation signal is inadequate for assessing PA pressure.  Left Atrium: Left atrial size was mildly dilated. No left atrial/left atrial appendage thrombus was detected.  Right Atrium: Right atrial size was mildly dilated.  Pericardium: There is no evidence of pericardial effusion.  Mitral Valve: The mitral valve is normal in structure. Trivial mitral valve regurgitation. No evidence of mitral valve stenosis.  Tricuspid Valve: The tricuspid valve is normal in structure. Tricuspid valve regurgitation is trivial.  Aortic Valve: The aortic valve is tricuspid. Aortic valve regurgitation is mild. Moderate aortic stenosis is present. Aortic valve mean gradient measures 28.0 mmHg. Aortic valve peak gradient measures 44.0 mmHg. Aortic valve area, by VTI measures 1.35 cm.  Pulmonic Valve: The pulmonic valve was normal in structure. Pulmonic valve regurgitation is not visualized.  Aorta: The aortic root is normal in size and structure.  IAS/Shunts: No PFO or ASD by color doppler.   LEFT VENTRICLE PLAX 2D LVOT diam:     2.40 cm LV SV:         92 LV SV Index:   32 LVOT Area:     4.52 cm   AORTIC VALVE AV Area (Vmax):    1.40 cm AV Area (Vmean):   1.37 cm AV Area (VTI):     1.35 cm AV Vmax:           331.67 cm/s AV Vmean:          246.667 cm/s AV VTI:            0.682 m AV Peak Grad:      44.0 mmHg AV Mean Grad:      28.0 mmHg LVOT Vmax:         103.00 cm/s LVOT Vmean:        74.700 cm/s LVOT VTI:          0.203 m LVOT/AV VTI ratio: 0.30   SHUNTS Systemic VTI:  0.20 m Systemic Diam: 2.40 cm  Dalton McleanMD Electronically signed by Archer Bear Signature Date/Time: 08/26/2021/3:04:43 PM    Final   MONITORS  CARDIAC  EVENT MONITOR 12/11/2020  Narrative  Predominant rhythm is sinus tachycardia with heart rate average 101bpm and ranged from 75 to 158bpm.  Frequent bouts of paroxysmal atrial fibrillation and flutter with variable conduction.  Freqent PACs, bigeminal PACs and atrial couplets.  SVT up to 9 seconds in duration ? atrial flutter with 2:1 conduction vs. atrial tachycardia.  Occasional PVCs, bigeminal and trigeminal PVCs, ventricular couplets and wide complex tachycardia up to 5 beats in a row  PVC load < 1%.   CT SCANS  CT [facility] W/CTA  COR W/SCORE 01/30/2023  Addendum 01/30/2023  2:12 PM ADDENDUM REPORT: 01/30/2023 14:10  CLINICAL DATA:  29M with severe aortic stenosis being evaluated for a TAVR procedure.  EXAM: Cardiac TAVR CT  TECHNIQUE: The patient was scanned on a [employer]. A 120 kV retrospective scan was triggered in the descending thoracic aorta at 111 HU's. Gantry rotation speed was 250 msecs and collimation was .6 mm. No beta blockade or nitro were given. The 3D data set was reconstructed in 5% intervals of the R-R cycle. Systolic and diastolic phases were analyzed on a dedicated work station using MPR, MIP and VRT modes. The patient received 100 cc of contrast.  FINDINGS: Aortic Root:  Aortic valve: Trileaflet  Aortic annulus:  Diameter: 29mm x 27mm  Perimeter: 92mm  Area: 657 mm^2  Calcifications: No calcifications  Coronary height: Min Left - 16mm, Min Right - 17mm  Sinotubular height: Left cusp - 24mm; Right cusp - 27mm; Noncoronary cusp - 29mm  LVOT (as measured 3 mm below the annulus):  Diameter: 30mm x 27mm  Area: 735 mm^2  Calcifications: No calcifications  Aortic sinus width: Left cusp - 37mm; Right cusp - 37mm; Noncoronary cusp - 37mm  Sinotubular junction width: 34mm x 33mm  Optimum Fluoroscopic Angle for Delivery: RAO 5 CRA 10  IMPRESSION: 1. Poor contrast opacification, which affects accuracy of  TAVR measurements  2.  Tricuspid aortic valve with severe calcifications  3. Aortic annulus measures 29mm x 27mm in diameter with perimeter 92mm and area 657 mm^2. No annular or LVOT calcifications. Annular measurements are suitable for placement of 29mm Sapien 3 valve  4. Sufficient coronary to annulus distance.  5. Optimum Fluoroscopic Angle for Delivery:  RAO 5 CRA 10  6. See prior study on 01/27/23 for aortic valve and coronary calcium  scores   Electronically Signed By: Carson Clara M.D. On: 01/30/2023 14:10  Narrative EXAM: OVER-READ INTERPRETATION  CT CHEST  The following report is a limited chest CT over-read performed by radiologist Dr. Leverne Reading of [facility] on 01/30/2023. This over-read does not include interpretation of cardiac or coronary anatomy or pathology. The coronary CTA interpretation by the cardiologist is attached.  COMPARISON:  01/27/2023  FINDINGS: Stable heart size. No pericardial effusion. Image thoracic aorta is nonaneurysmal. Aortic and coronary artery atherosclerosis. Central pulmonary vasculature within normal limits.  Mildly enlarged subcarinal and bilateral hilar lymph nodes, unchanged. Atelectatic changes within the dependent lung fields. Imaged lung fields are otherwise clear. No acute bony or chest wall abnormality.  IMPRESSION: 1. No new or acute extracardiac findings. 2. Mildly enlarged subcarinal and bilateral hilar lymph nodes, unchanged. 3. Aortic and coronary artery atherosclerosis (ICD10-I70.0).  Electronically Signed: By: Leverne Reading D.O. On: 01/30/2023 13:19   CT CORONARY MORPH W/CTA COR W/SCORE 01/28/2023  Addendum 01/28/2023 11:32 AM ADDENDUM REPORT: 01/28/2023 11:30  EXAM: OVER-READ INTERPRETATION  CT CHEST  The following report is an over-read performed by radiologist Dr. Selwyn Dalton [facility] on 01/28/2023. This over-read does not include interpretation of  cardiac or coronary anatomy or pathology. The cardiac CTA interpretation by the cardiologist is attached.  COMPARISON:  10/26/2022 chest CT angiogram.  FINDINGS: Please see the separate concurrent chest CT angiogram report for details.  IMPRESSION: Please see the separate concurrent chest CT angiogram report for details.   Electronically Signed By: Levell Reach M.D. On: 01/28/2023 11:30  Narrative CLINICAL DATA:  29M with severe aortic stenosis being evaluated for a TAVR procedure.  EXAM: Cardiac TAVR  CT  TECHNIQUE: The patient was scanned on a [employer]. A 120 kV retrospective scan was triggered in the descending thoracic aorta at 111 HU's. Gantry rotation speed was 250 msecs and collimation was .6 mm. No beta blockade or nitro were given. The 3D data set was reconstructed in 5% intervals of the R-R cycle. Systolic and diastolic phases were analyzed on a dedicated work station using MPR, MIP and VRT modes. The patient received 100 cc of contrast.  FINDINGS: Aortic valve: Trileaflet. Poor contrast opacification, unable to make TAVR measurements  Aortic valve calcium  score: 3685  Cardiac:  Right atrium: Mild enlargement. Lipomatous hypertrophy of interatrial septum  Right ventricle: Mild enlargement  Pulmonary arteries: Normal size  Pulmonary veins: Normal configuration  Left atrium: Mild enlargement  Left ventricle: Normal size  Pericardium: Normal thickness  Coronary arteries: Calcium  score 4290 (99th percentile)  IMPRESSION: 1. During contrast injection, IV pressure limit was reached, resulting in low contrast injection rate and very poor contrast opacification. Contrast is not sufficient to make TAVR measurements. Recommend repeating study.  2. Tricuspid aortic valve with severe calcifications (AV calcium  score 3685)  3.  Coronary calcium  score 4290 (99th percentile)  Electronically Signed: By: Carson Clara M.D. On:  01/27/2023 17:48           Patient Profile     67 y.o. male with mixed picture hypoxic respiratory failure related to non-obstructive CAD,  chronic diastolic HF s/p cardiomems, paroxysmal atrial fibrillation on Eliquis  (held for oral surgery), severe aortic stenosis noted on recent Echo in 12/2022, hypertension, hyperlipidemia, type 2 diabetes mellitus with peripheral neuropathy, COPD, obstructive sleep apnea on CPAP, seizure disorder, and prior tobacco use (quit about 6 years ago) who presented with chest pain and SOB found to have acute on chronic diastolic HF in the setting of severe AS now planned for TAVR on 7/2.  Assessment & Plan    #Acute on Chronic Diastolic HF: #Acute Hypoxic Respiratory Failure: -TTE 12/22/22 with LVEF 60-65% with severe AS with mean gradient , AVA 1cm2 -Now presenting with worsening chest pain and SOB found to be volume overloaded in the setting of severe AS -Diuresed well to IV lasix  now transitioned to torsemide  20mg  daily -Continue spironolactone  12.5mg  daily -SGLT2i held prior to TAVR on Tuesday; will resume post-op -Manage severe AS as below  #Severe AS: -TTE with mean AoV gradient , AVA 1cm2 -Had oral surgery for poor dentition which he tolerated well -Now planned for TAVR on 7/2; patient does not wish to go home prior to the procedure as he states he will likely need to come back due to his chest discomfort  #Noncardiac Chest Pain: #Nonobstructive CAD: -Cath 01/2023 with mild to moderate diffuse nonobstructive CAD -Not on ASA due to need for [facility] -Continue lipitor 40mg  daily  #Paroxysmal Afib: #Frequent PVCs: -Continue dilt 120mg  daily -On heparin  gtt in preparation for TAVR on 7/2; plan to transition back to home apixaban  prior to discharge  #HTN: -Controlled today -Continue spiro 12.5mg  daily -Continue dilt 120mg  daily     For questions or updates, please contact [facility] Please consult [email or web address] for contact info  under        Signed, Sonny Dust, MD  02/04/2023, 8:25 AM

## 2023-02-03 NOTE — Progress Notes (Signed)
Progress Note  Patient Name: Lawrence [name] Date of Encounter: 02/03/2023  Primary Cardiologist: Armanda Magic, MD  Subjective   S/p oral surgery today.  Did well.  He is planned for TAVR on Tuesday. Still sleeping post surgery but otherwise well.  Inpatient Medications    Scheduled Meds:  allopurinol  200 mg Oral BID   ARIPiprazole  5 mg Oral QHS   atorvastatin  40 mg Oral Daily   busPIRone  10 mg Oral TID   diclofenac Sodium  4 g Topical QID   diltiazem  120 mg Oral Daily   fenofibrate  160 mg Oral Daily   fluticasone furoate-vilanterol  1 puff Inhalation Daily   And   umeclidinium bromide  1 puff Inhalation Daily   insulin aspart  0-15 Units Subcutaneous TID WC   insulin aspart  0-5 Units Subcutaneous QHS   insulin aspart  3 Units Subcutaneous TID WC   insulin glargine-yfgn  30 Units Subcutaneous Daily   lacosamide  150 mg Oral BID   levETIRAcetam  500 mg Oral BID   lidocaine  1 patch Transdermal Q24H   metoprolol succinate  50 mg Oral Daily   mirtazapine  15 mg Oral QHS   mupirocin ointment  1 Application Nasal BID   pantoprazole  40 mg Oral BID   potassium chloride  40 mEq Oral Daily   prazosin  2 mg Oral QHS   pregabalin  150 mg Oral TID   spironolactone  12.5 mg Oral Daily   torsemide  20 mg Oral Daily   traZODone  200 mg Oral QHS   venlafaxine XR  225 mg Oral Q breakfast   Continuous Infusions:  PRN Meds: acetaminophen **OR** acetaminophen, guaiFENesin, hydrALAZINE, ipratropium-albuterol, melatonin, morphine injection, ondansetron (ZOFRAN) IV, senna-docusate   Vital Signs    Vitals:   02/03/23 0920 02/03/23 0930 02/03/23 0945 02/03/23 1013  BP: 119/79 124/80 130/83 (!) 107/59  Pulse: 77 73 77 78  Resp: 18 15 15    Temp: 97.8 F (36.6 C)  97.9 F (36.6 C) (!) 97.5 F (36.4 C)  TempSrc:    Axillary  SpO2: 93% 98% 93% 94%  Weight:      Height:        Intake/Output Summary (Last 24 hours) at 02/03/2023 1030 Last data filed at 02/03/2023  0932 Gross per 24 hour  Intake 1080 ml  Output 2750 ml  Net -1670 ml      02/03/2023    4:44 AM 02/02/2023    5:05 AM 02/01/2023    6:20 AM  Last 3 Weights  Weight (lbs) 359 lb 11.2 oz 359 lb 365 lb 15.4 oz  Weight (kg) 163.159 kg 162.841 kg 166 kg     Telemetry    SR occ PVC with couplets and bigeminy- Personally Reviewed  Physical Exam   GEN: No acute distress. Morbid obesity HEENT: Bilateral Frank's Sign Neck: Thick neck Cardiac: Irregular 2/6 SEM no rubs or gallops. Distant heart sounds Respiratory: Clear to auscultation bilaterally. Breathing is unlabored. GI: Soft, obese but soft, BS +x 4. MS: no deformity. Extremities: No clubbing or cyanosis. no edema. Distal pedal pulses are 2+ and equal bilaterally. Neuro:  AAOx3. Follows commands. Occasional sleepy on BIPAP, but otherwise conversant  Labs    High Sensitivity Troponin:   Recent Labs  Lab 01/22/23 2054 01/22/23 2348  TROPONINIHS 8 8      Cardiac EnzymesNo results for input(s): "TROPONINI" in the last 168 hours. No results for input(s): "TROPIPOC" in  the last 168 hours.   Chemistry Recent Labs  Lab 02/02/23 0249 02/02/23 1417 02/03/23 0244  NA 136 135 137  K 3.3* 3.9 3.1*  CL 98 96* 99  CO2 29 28 28   GLUCOSE 120* 213* 131*  BUN 17 18 22   CREATININE 0.86 1.27* 1.05  CALCIUM 8.7* 8.9 8.7*  GFRNONAA >60 >60 >60  ANIONGAP 9 11 10      Hematology Recent Labs  Lab 01/31/23 0147 02/02/23 0249 02/03/23 0244  WBC 10.1 11.6* 10.4  RBC 4.33 4.64 4.57  HGB 12.6* 13.6 13.4  HCT 39.6 41.9 41.6  MCV 91.5 90.3 91.0  MCH 29.1 29.3 29.3  MCHC 31.8 32.5 32.2  RDW 15.4 15.2 15.1  PLT 252 263 265    BNPNo results for input(s): "BNP", "PROBNP" in the last 168 hours.   DDimer No results for input(s): "DDIMER" in the last 168 hours.    Patient Profile     67 yo with mixed picture hypoxic respiratory failure related to non-obstructive CAD,  chronic diastolic HF s/p cardiomems, paroxysmal atrial  fibrillation on Eliquis (held for oral surgery), severe aortic stenosis noted on recent Echo in 12/2022, hypertension, hyperlipidemia, type 2 diabetes mellitus with peripheral neuropathy, COPD, obstructive sleep apnea on CPAP, seizure disorder, and prior tobacco use (quit about 6 years ago), complicated by deconditioning   Assessment & Plan    Acute hypoxic respiratory failure in setting of acute on chronic diastolic CHF, severe aortic stenosis, HTN with some hypotension as well - continue Toprol, spironolctone - BP appears to be tolerating - getting metolazone PRN this admission, likely twice weekly 2.5 at DC - SGLTI2 held for TAVR this Tuesday - If he wakes up a bit from his surgery we can transition back to Oxygen - answered post op questions from sister [name] and Venetia Maxon; we have their numbers in demographics section  Non cardiac chest Pain Con-obstructive CAD, HLD - continue beta blocker - continue statin   H/o PAF (on OAC PTA) with frequent PVCs on telemetry - Eliquis was on hold for cath, resumed 6/22, held starting 6/26 PM for today's surgery - continue Toprol and diltiazem - potassium addressed below - Mg has been normal   Hypokalemia - increased to 60 meq daily  Otherwise, per primary team: - COPD - Type 2 diabetes mellitus - Seizure disorder - Anxiety and depression - OSA on CPAP  For questions or updates, please contact [facility] Please consult [email or web address] for contact info under Cardiology/STEMI.   Riley Lam, MD FASE [facility] Cardiologist [facility]  [address] [place], [address] [place], [place] [address] [phone]  10:30 AM

## 2023-02-03 NOTE — Anesthesia Preprocedure Evaluation (Addendum)
Anesthesia Evaluation  Patient identified by MRN, date of birth, ID band Patient awake    Reviewed: Allergy & Precautions, NPO status , Patient's Chart, lab work & pertinent test results  Airway Mallampati: III  TM Distance: <3 FB Neck ROM: Full    Dental  (+) Teeth Intact, Dental Advisory Given, Poor Dentition   Pulmonary asthma , sleep apnea , COPD, former smoker   breath sounds clear to auscultation       Cardiovascular hypertension, Pt. on medications and Pt. on home beta blockers + CAD and +CHF  + Valvular Problems/Murmurs AS  Rhythm:Regular Rate:Normal + Systolic murmurs Echo:  1. Left ventricular ejection fraction, by estimation, is 60 to 65%. The  left ventricle has normal function. The left ventricle has no regional  wall motion abnormalities. There is mild left ventricular hypertrophy.  Left ventricular diastolic parameters  were normal.   2. Right ventricular systolic function is normal. The right ventricular  size is normal.   3. Left atrial size was moderately dilated.   4. The mitral valve is abnormal. No evidence of mitral valve  regurgitation. No evidence of mitral stenosis.   5. AS has progressed since TTE done 05/2022. The aortic valve was not  well visualized. There is moderate calcification of the aortic valve.  There is moderate thickening of the aortic valve. Aortic valve  regurgitation is not visualized. Severe aortic  valve stenosis.   6. Aortic dilatation noted. There is mild dilatation of the aortic root,  measuring 38 mm. There is mild dilatation of the ascending aorta,  measuring 40 mm.   7. The inferior vena cava is normal in size with greater than 50%  respiratory variability, suggesting right atrial pressure of 3 mmHg.     Neuro/Psych Seizures -,  PSYCHIATRIC DISORDERS Anxiety Depression    CVA    GI/Hepatic Neg liver ROS,GERD  Medicated,,  Endo/Other  diabetes, Type 2, Insulin Dependent,  Oral Hypoglycemic Agents    Renal/GU negative Renal ROS     Musculoskeletal  (+) Arthritis ,    Abdominal   Peds  Hematology negative hematology ROS (+)   Anesthesia Other Findings   Reproductive/Obstetrics                             Anesthesia Physical Anesthesia Plan  ASA: 4  Anesthesia Plan: General   Post-op Pain Management: Tylenol PO (pre-op)*   Induction: Intravenous  PONV Risk Score and Plan: 3 and Ondansetron, Dexamethasone and Midazolam  Airway Management Planned: Nasal ETT  Additional Equipment: Arterial line  Intra-op Plan:   Post-operative Plan: Extubation in OR  Informed Consent: I have reviewed the patients History and Physical, chart, labs and discussed the procedure including the risks, benefits and alternatives for the proposed anesthesia with the patient or authorized representative who has indicated his/her understanding and acceptance.     Dental advisory given  Plan Discussed with: CRNA  Anesthesia Plan Comments:        Anesthesia Quick Evaluation

## 2023-02-03 NOTE — Anesthesia Procedure Notes (Signed)
Arterial Line Insertion Start/End6/28/2024 7:30 AM, 02/03/2023 7:33 AM Performed by: Shelton Silvas, MD, anesthesiologist  Patient location: Pre-op. Preanesthetic checklist: patient identified, IV checked, site marked, risks and benefits discussed, surgical consent, monitors and equipment checked, pre-op evaluation, timeout performed and anesthesia consent Lidocaine 1% used for infiltration Left, radial was placed Catheter size: 20 G Hand hygiene performed  and maximum sterile barriers used   Attempts: 1 Procedure performed without using ultrasound guided technique. Following insertion, dressing applied and Biopatch. Post procedure assessment: normal and unchanged  Post procedure complications: second provider assisted. Patient tolerated the procedure well with no immediate complications.

## 2023-02-03 NOTE — Anesthesia Postprocedure Evaluation (Signed)
Anesthesia Post Note  Patient: RIDER LOCKARD  Procedure(s) Performed: DENTAL RESTORATION/EXTRACTIONS     Patient location during evaluation: PACU Anesthesia Type: General Level of consciousness: awake and alert Pain management: pain level controlled Vital Signs Assessment: post-procedure vital signs reviewed and stable Respiratory status: spontaneous breathing, nonlabored ventilation, respiratory function stable and patient connected to nasal cannula oxygen Cardiovascular status: blood pressure returned to baseline and stable Postop Assessment: no apparent nausea or vomiting Anesthetic complications: no  No notable events documented.  Last Vitals:  Vitals:   02/03/23 0945 02/03/23 1013  BP: 130/83 (!) 107/59  Pulse: 77 78  Resp: 15   Temp: 36.6 C (!) 36.4 C  SpO2: 93% 94%    Last Pain:  Vitals:   02/03/23 1541  TempSrc:   PainSc: 3                  Shelton Silvas

## 2023-02-03 NOTE — Transfer of Care (Signed)
Immediate Anesthesia Transfer of Care Note  Patient: Lawrence [name]  Procedure(s) Performed: DENTAL RESTORATION/EXTRACTIONS  Patient Location: PACU  Anesthesia Type:General  Level of Consciousness: awake, alert , and patient cooperative  Airway & Oxygen Therapy: Patient Spontanous Breathing and Patient connected to face mask oxygen  Post-op Assessment: Report given to RN and Post -op Vital signs reviewed and stable  Post vital signs: Reviewed and stable  Last Vitals:  Vitals Value Taken Time  BP 124/80 02/03/23 0930  Temp 36.6 C 02/03/23 0920  Pulse 73 02/03/23 0932  Resp 15 02/03/23 0932  SpO2 97 % 02/03/23 0932  Vitals shown include unvalidated device data.  Last Pain:  Vitals:   02/03/23 0920  TempSrc:   PainSc: Asleep      Patients Stated Pain Goal: 2 (02/01/23 2132)  Complications: No notable events documented.

## 2023-02-03 NOTE — Progress Notes (Signed)
PROGRESS NOTE  Lawrence [name]  WUJ:[id number] DOB: April 13, 1956 DOA: 01/22/2023 PCP: [name], PA-C   Brief Narrative: Patient is a 67 year old male with history of hypertension, chronic diastolic CHF, paroxysmal A-fib on Eliquis, aortic stenosis, coronary artery disease, CVA, diabetes type 2, COPD, AAA, morbid obesity, OSA on CPAP who presented with shortness of breath, intermittent left-sided chest pain for 2 weeks.  Workup showed severe aortic stenosis.  Cardiology consulted, plan for TAVR on 7/2.  Due to poor dental condition, oral surgery was consulted and s/p  tooth extraction today.  Assessment & Plan:  Principal Problem:   Acute hypoxic respiratory failure (HCC) Active Problems:   Chest pain   Chronic diastolic CHF (congestive heart failure) (HCC)   OSA (obstructive sleep apnea)   Type 2 diabetes mellitus with hyperlipidemia (HCC)   Essential hypertension   Seizure disorder (HCC)   AF (paroxysmal atrial fibrillation) (HCC)   COPD (chronic obstructive pulmonary disease) (HCC)  Acute hypoxic respiratory failure: Most likely multifactorial secondary to volume overload due to severe aortic stenosis/dCHF/OSA/obesity hypoventilation syndrome.  CTA chest negative for PE.  Continue bronchodilators as needed.  Not on oxygen at home.  Currently on 3 to 4 L of oxygen.  He has crackles on bilateral bases.  He may benefit with IV Lasix.  Will discuss with cardiology.  Acute on chronic diastolic CHF: Echocardiogram done in May showed EF of 65%, severe aortic stenosis.    Severe aortic stenosis: TAVR planned for 7/2.  Left heart cath negative for obstructive disease.  Cardiology, cardiothoracic surgery following  COPD: Continue home inhalers.  Continue bronchodilators as needed.  Not on oxygen at home.   Diabetes type 2: Takes insulin at home.  Recent A1c of 6.4.  Continue current insulin regimen.  Monitor blood sugars  Paroxysmal A-fib: Currently in normal sinus rhythm.  Continue  Eliquis for anticoagulation.  On Cardizem for rate control  Hypertension: Continue monitor blood pressure, continue current medications  Anxiety/depression: BuSpar, Abilify, venlafaxine  History of seizure disorder: On Keppra, lacosamide  History of OSA: On CPAP  Hypokalemia: Being supplemented.  Magnesium level optimal  Morbid obesity: BMI 47         DVT prophylaxis:SCD's Start: 02/03/23 0711 SCDs Start: 01/23/23 0527     Code Status: DNR  Family Communication: Wife at bedside  Patient status:Inpatient  Patient is from :Home  Anticipated discharge NF:AOZH  Estimated DC date:4- 5 days, for TAVR   Consultants: Cardiology  Procedures:None  Antimicrobials:  Anti-infectives (From admission, onward)    Start     Dose/Rate Route Frequency Ordered Stop   02/03/23 0715  ceFAZolin (ANCEF) IVPB 3g/100 mL premix        3 g 200 mL/hr over 30 Minutes Intravenous On call to O.R. 02/03/23 0711 02/04/23 0559   01/22/23 0000  cephALEXin (KEFLEX) 500 MG capsule        500 mg Oral 4 times daily 01/22/23 2347         Subjective: Patient seen and examined at bedside.  He just came from dental surgery.  Remains comfortable.  On 2 L of oxygen per minute.  Denies any worsening shortness of breath or cough.  Objective: Vitals:   02/02/23 1239 02/02/23 1513 02/02/23 1933 02/03/23 0444  BP:  119/69 122/75   Pulse: 77 76 72   Resp: 18 18 20  (!) 21  Temp: 97.6 F (36.4 C) 97.7 F (36.5 C) 97.8 F (36.6 C) 97.7 F (36.5 C)  TempSrc: Oral Oral Oral Oral  SpO2: 95% 97% 91%   Weight:    (!) 163.2 kg  Height:        Intake/Output Summary (Last 24 hours) at 02/03/2023 1610 Last data filed at 02/03/2023 0000 Gross per 24 hour  Intake 480 ml  Output 2750 ml  Net -2270 ml   Filed Weights   02/01/23 0620 02/02/23 0505 02/03/23 0444  Weight: (!) 166 kg (!) 162.8 kg (!) 163.2 kg    Examination:  General exam: Overall comfortable, not in distress, morbidly obese HEENT:  PERRL Respiratory system: Diminished sounds on the bases, bilateral basal crackles Cardiovascular system: S1 & S2 heard, RRR.  Gastrointestinal system: Abdomen is obese, soft and nontender. Central nervous system: Alert and oriented Extremities: Trace bilateral lower extremity edema, no clubbing ,no cyanosis Skin: No rashes, no ulcers,no icterus     Data Reviewed: I have personally reviewed following labs and imaging studies  CBC: Recent Labs  Lab 01/28/23 0224 01/31/23 0147 02/02/23 0249 02/03/23 0244  WBC 10.0 10.1 11.6* 10.4  HGB 13.7 12.6* 13.6 13.4  HCT 42.5 39.6 41.9 41.6  MCV 88.9 91.5 90.3 91.0  PLT 265 252 263 265   Basic Metabolic Panel: Recent Labs  Lab 01/31/23 0147 02/01/23 1401 02/02/23 0249 02/02/23 1417 02/03/23 0244  NA 138 134* 136 135 137  K 3.6 3.5 3.3* 3.9 3.1*  CL 103 96* 98 96* 99  CO2 27 26 29 28 28   GLUCOSE 121* 175* 120* 213* 131*  BUN 16 20 17 18 22   CREATININE 0.86 0.97 0.86 1.27* 1.05  CALCIUM 8.4* 8.9 8.7* 8.9 8.7*  MG  --  1.9 2.0  --  2.0     Recent Results (from the past 240 hour(s))  Surgical PCR screen     Status: Abnormal   Collection Time: 02/03/23  2:00 AM   Specimen: Nasal Mucosa; Nasal Swab  Result Value Ref Range Status   MRSA, PCR NEGATIVE NEGATIVE Final   Staphylococcus aureus POSITIVE (A) NEGATIVE Final    Comment: (NOTE) The Xpert SA Assay (FDA approved for NASAL specimens in patients 45 years of age and older), is one component of a comprehensive surveillance program. It is not intended to diagnose infection nor to guide or monitor treatment. Performed at [facility], [address] [place], [place] [address]      Radiology Studies: No results found.  Scheduled Meds:  [MAR Hold] allopurinol  200 mg Oral BID   [MAR Hold] ARIPiprazole  5 mg Oral QHS   [MAR Hold] atorvastatin  40 mg Oral Daily   [MAR Hold] busPIRone  10 mg Oral TID   [MAR Hold] dapagliflozin propanediol  10 mg Oral Q breakfast    [MAR Hold] diclofenac Sodium  4 g Topical QID   [MAR Hold] diltiazem  120 mg Oral Daily   [MAR Hold] fenofibrate  160 mg Oral Daily   [MAR Hold] fluticasone furoate-vilanterol  1 puff Inhalation Daily   And   [MAR Hold] umeclidinium bromide  1 puff Inhalation Daily   [MAR Hold] insulin aspart  0-15 Units Subcutaneous TID WC   [MAR Hold] insulin aspart  0-5 Units Subcutaneous QHS   [MAR Hold] insulin aspart  3 Units Subcutaneous TID WC   [MAR Hold] insulin glargine-yfgn  30 Units Subcutaneous Daily   [MAR Hold] lacosamide  150 mg Oral BID   [MAR Hold] levETIRAcetam  500 mg Oral BID   [MAR Hold] lidocaine  1 patch Transdermal Q24H   [MAR Hold] metoprolol  succinate  50 mg Oral Daily   [MAR Hold] mirtazapine  15 mg Oral QHS   [MAR Hold] mupirocin ointment  1 Application Nasal BID   [MAR Hold] pantoprazole  40 mg Oral BID   [MAR Hold] potassium chloride  40 mEq Oral Daily   [MAR Hold] prazosin  2 mg Oral QHS   [MAR Hold] pregabalin  150 mg Oral TID   [MAR Hold] spironolactone  12.5 mg Oral Daily   [MAR Hold] torsemide  20 mg Oral Daily   [MAR Hold] traZODone  200 mg Oral QHS   [MAR Hold] venlafaxine XR  225 mg Oral Q breakfast   Continuous Infusions:   ceFAZolin (ANCEF) IV       LOS: 11 days   Burnadette Pop, MD [facility] P6/28/2024, 8:08 AM

## 2023-02-03 NOTE — Progress Notes (Signed)
Pt transported from PACU to 6E16 on NIV without any complications

## 2023-02-03 NOTE — Anesthesia Procedure Notes (Signed)
Procedure Name: Intubation Date/Time: 02/03/2023 8:05 AM  Performed by: Darlina Guys, CRNAPre-anesthesia Checklist: Patient identified, Emergency Drugs available, Suction available and Patient being monitored Oxygen Delivery Method: Circle system utilized Preoxygenation: Pre-oxygenation with 100% oxygen Induction Type: IV induction Laryngoscope Size: Glidescope and 3 Grade View: Grade I Tube type: Oral Rae Tube size: 8.0 mm Number of attempts: 1

## 2023-02-03 NOTE — Plan of Care (Signed)

## 2023-02-03 NOTE — Op Note (Signed)
NAME: Lawrence [name], HUSBANDS MEDICAL RECORD NO: [id number] ACCOUNT NO: [id number] DATE OF BIRTH: 1955-08-29 FACILITY: [facility] LOCATION: [facility] PHYSICIAN: Georgia Lopes, DDS  Operative Report   DATE OF PROCEDURE: 02/03/2023  PREOPERATIVE DIAGNOSIS:  Nonrestorable teeth 2, 3, 5, 7 13, 14, 19, 24, 25, 29, 30 and 31 secondary to dental caries.  POSTOPERATIVE DIAGNOSIS:  Nonrestorable teeth 2, 3, 5, 7 13, 14, 19, 24, 25, 29, 30 and 31 secondary to dental caries.  PROCEDURE:  Extraction teeth numbers 2, 3, 5, 7, 13, 14, 19, 24, 25, 29, 30 and 31.  INDICATIONS FOR PROCEDURE:  The patient is a 67 year old with history of CAD, chronic diastolic heart failure, atrial fib on Eliquis, aortic stenosis, COPD, obstructive sleep apnea, seizure disorder, morbid obesity, admitted on 01/22/2023 with acute  hypoxic respiratory failure in the setting of diastolic heart failure.  He was planned for TAVR and needed dental consult, consult was obtained and multiple nonrestorable teeth were identified.  It was indicated to remove these prior to his surgery on  the heart.  DESCRIPTION OF PROCEDURE:  The patient was taken to the operating room and placed on the table in supine position.  General anesthesia was administered and nasal endotracheal tube was placed and secured.  The eyes were protected.  The patient was draped  for surgery.  Timeout was performed.  The posterior pharynx was suctioned and a throat pack was placed.  2% lidocaine 1:100,000 epinephrine was infiltrated in an inferior alveolar block on the right and left side and in buccal and palatal infiltration  around the maxillary teeth to be removed.  The left side was operated first.  A 15 blade was used to make an incision around tooth number 19 and in the maxilla around teeth numbers 13 and 14.  The periosteum was reflected.  The teeth were elevated with  301 elevator and removed from the mouth with the dental rongeur.  The sockets were curetted and irrigated.   Gelfoam sponge was placed and then the areas were closed with 3-0 chromic.  Then, the throat pack was removed and the endotracheal tube was  repositioned to the left side of the mouth.  A new throat pack was placed and then a 15 blade was used to make an incision around teeth numbers 29, 30, 31 in the mandible as well as around teeth numbers 24 and 5 in the anterior mandible and then around  teeth numbers 2, 3, 5 and 7 in the maxilla.  The periosteum was reflected from around these teeth.  The teeth were elevated. Teeth numbers 2, 3, 5, 7, 24, 25 29 and 31 were removed with the rongeurs and the 301 elevator.  Tooth #30 required removal of  circumferential bone in order to gain purchase on the tooth to elevate it and remove it.  Once the teeth were removed, the sockets were curetted, irrigated and Gelfoam sponges were placed and the sockets were closed with 3-0 chromic.  The oral cavity was  then irrigated and suctioned and a throat pack was removed.  The patient was left under care of anesthesia for extubation and transport to recovery and plans to return to the floor.  ESTIMATED BLOOD LOSS:  Minimal.  COMPLICATIONS:  None.  SPECIMENS:  None.  COUNTS:  Correct.   PUS D: 02/03/2023 9:12:24 am T: 02/03/2023 10:17:00 am  JOB: [id number]/ [id number]

## 2023-02-03 NOTE — H&P (Signed)
H&P documentation  -History and Physical Reviewed  -Patient has been re-examined  -No change in the plan of care  Lawrence [name]  

## 2023-02-04 ENCOUNTER — Encounter (HOSPITAL_COMMUNITY): Payer: Self-pay | Admitting: Cardiology

## 2023-02-04 ENCOUNTER — Encounter (HOSPITAL_COMMUNITY): Payer: Self-pay | Admitting: Oral Surgery

## 2023-02-04 DIAGNOSIS — J9601 Acute respiratory failure with hypoxia: Secondary | ICD-10-CM | POA: Diagnosis not present

## 2023-02-04 DIAGNOSIS — I5032 Chronic diastolic (congestive) heart failure: Secondary | ICD-10-CM | POA: Diagnosis not present

## 2023-02-04 DIAGNOSIS — R0789 Other chest pain: Secondary | ICD-10-CM | POA: Diagnosis not present

## 2023-02-04 DIAGNOSIS — I480 Paroxysmal atrial fibrillation: Secondary | ICD-10-CM | POA: Diagnosis not present

## 2023-02-04 LAB — BASIC METABOLIC PANEL
Anion gap: 10 (ref 5–15)
BUN: 20 mg/dL (ref 8–23)
CO2: 28 mmol/L (ref 22–32)
Calcium: 9 mg/dL (ref 8.9–10.3)
Chloride: 97 mmol/L — ABNORMAL LOW (ref 98–111)
Creatinine, Ser: 0.93 mg/dL (ref 0.61–1.24)
GFR, Estimated: >60 mL/min (ref >60)
Glucose, Bld: 211 mg/dL — ABNORMAL HIGH (ref 70–99)
Potassium: 3.5 mmol/L (ref 3.5–5.1)
Sodium: 135 mmol/L (ref 135–145)

## 2023-02-04 LAB — CBC
HCT: 43.2 % (ref 39.0–52.0)
Hemoglobin: 14.1 g/dL (ref 13.0–17.0)
MCH: 29.3 pg (ref 26.0–34.0)
MCHC: 32.6 g/dL (ref 30.0–36.0)
MCV: 89.8 fL (ref 80.0–100.0)
Platelets: 285 K/uL (ref 150–400)
RBC: 4.81 MIL/uL (ref 4.22–5.81)
RDW: 14.9 % (ref 11.5–15.5)
WBC: 14.8 K/uL — ABNORMAL HIGH (ref 4.0–10.5)
nRBC: 0 % (ref 0.0–0.2)

## 2023-02-04 LAB — GLUCOSE, CAPILLARY
Glucose-Capillary: 207 mg/dL — ABNORMAL HIGH (ref 70–99)
Glucose-Capillary: 206 mg/dL — ABNORMAL HIGH (ref 70–99)
Glucose-Capillary: 219 mg/dL — ABNORMAL HIGH (ref 70–99)
Glucose-Capillary: 243 mg/dL — ABNORMAL HIGH (ref 70–99)

## 2023-02-04 LAB — MAGNESIUM: Magnesium: 2.1 mg/dL (ref 1.7–2.4)

## 2023-02-04 NOTE — Progress Notes (Signed)
Lawrence [name] PROGRESS NOTE:   SUBJECTIVE: Mild discomfort. Pain medicine working. No bleeding today.  OBJECTIVE:  Vitals: Blood pressure (!) 107/55, pulse 78, temperature 97.7 F (36.5 C), temperature source Oral, resp. rate 20, height 6\' 2"  (1.88 m), weight (!) 162.2 kg, SpO2 93 %. Lab results: Results for orders placed or performed during the hospital encounter of 01/22/23 (from the past 24 hour(s))  Glucose, capillary     Status: Abnormal   Collection Time: 02/03/23 12:48 PM  Result Value Ref Range   Glucose-Capillary 206 (H) 70 - 99 mg/dL  Basic metabolic panel     Status: Abnormal   Collection Time: 02/03/23  2:29 PM  Result Value Ref Range   Sodium 135 135 - 145 mmol/L   Potassium 4.0 3.5 - 5.1 mmol/L   Chloride 98 98 - 111 mmol/L   CO2 28 22 - 32 mmol/L   Glucose, Bld 208 (H) 70 - 99 mg/dL   BUN 17 8 - 23 mg/dL   Creatinine, Ser 1.61 0.61 - 1.24 mg/dL   Calcium 8.9 8.9 - 09.6 mg/dL   GFR, Estimated >04 >54 mL/min   Anion gap 9 5 - 15  Glucose, capillary     Status: Abnormal   Collection Time: 02/03/23  4:41 PM  Result Value Ref Range   Glucose-Capillary 230 (H) 70 - 99 mg/dL  Glucose, capillary     Status: Abnormal   Collection Time: 02/03/23  9:12 PM  Result Value Ref Range   Glucose-Capillary 231 (H) 70 - 99 mg/dL  CBC     Status: Abnormal   Collection Time: 02/04/23  2:18 AM  Result Value Ref Range   WBC 14.8 (H) 4.0 - 10.5 K/uL   RBC 4.81 4.22 - 5.81 MIL/uL   Hemoglobin 14.1 13.0 - 17.0 g/dL   HCT 09.8 11.9 - 14.7 %   MCV 89.8 80.0 - 100.0 fL   MCH 29.3 26.0 - 34.0 pg   MCHC 32.6 30.0 - 36.0 g/dL   RDW 82.9 56.2 - 13.0 %   Platelets 285 150 - 400 K/uL   nRBC 0.0 0.0 - 0.2 %  Magnesium     Status: None   Collection Time: 02/04/23  2:18 AM  Result Value Ref Range   Magnesium 2.1 1.7 - 2.4 mg/dL  Basic metabolic panel     Status: Abnormal   Collection Time: 02/04/23  2:18 AM  Result Value Ref Range   Sodium 135 135 - 145 mmol/L   Potassium 3.5 3.5  - 5.1 mmol/L   Chloride 97 (L) 98 - 111 mmol/L   CO2 28 22 - 32 mmol/L   Glucose, Bld 211 (H) 70 - 99 mg/dL   BUN 20 8 - 23 mg/dL   Creatinine, Ser 8.65 0.61 - 1.24 mg/dL   Calcium 9.0 8.9 - 78.4 mg/dL   GFR, Estimated >69 >62 mL/min   Anion gap 10 5 - 15  Glucose, capillary     Status: Abnormal   Collection Time: 02/04/23  7:42 AM  Result Value Ref Range   Glucose-Capillary 207 (H) 70 - 99 mg/dL   Radiology Results: No results found. General appearance: alert, cooperative, no distress, and morbidly obese Head: Normocephalic, without obvious abnormality, atraumatic Eyes: negative Nose: Nares normal. Septum midline. Mucosa normal. No drainage or sinus tenderness. Throat: Hemostatic, sutures intact. Minimal edema. No trismus, purulence, fluctuance. Neck: no adenopathy and no edema.  ASSESSMENT: Doing well s/p oral surgery  PLAN: OK to restart Eliquis per  Cardiology. Call if further help needed.   Lawrence [name] 02/04/2023

## 2023-02-05 DIAGNOSIS — I480 Paroxysmal atrial fibrillation: Secondary | ICD-10-CM | POA: Diagnosis not present

## 2023-02-05 DIAGNOSIS — I5032 Chronic diastolic (congestive) heart failure: Secondary | ICD-10-CM | POA: Diagnosis not present

## 2023-02-05 DIAGNOSIS — R0789 Other chest pain: Secondary | ICD-10-CM | POA: Diagnosis not present

## 2023-02-05 DIAGNOSIS — J9601 Acute respiratory failure with hypoxia: Secondary | ICD-10-CM | POA: Diagnosis not present

## 2023-02-05 LAB — BASIC METABOLIC PANEL
Anion gap: 10 (ref 5–15)
BUN: 27 mg/dL — ABNORMAL HIGH (ref 8–23)
CO2: 30 mmol/L (ref 22–32)
Calcium: 9 mg/dL (ref 8.9–10.3)
Chloride: 95 mmol/L — ABNORMAL LOW (ref 98–111)
Creatinine, Ser: 1.03 mg/dL (ref 0.61–1.24)
GFR, Estimated: >60 mL/min (ref >60)
Glucose, Bld: 170 mg/dL — ABNORMAL HIGH (ref 70–99)
Potassium: 3.6 mmol/L (ref 3.5–5.1)
Sodium: 135 mmol/L (ref 135–145)

## 2023-02-05 LAB — APTT: aPTT: 21 s — ABNORMAL LOW (ref 24–36)

## 2023-02-05 LAB — PROTIME-INR
INR: 1 (ref 0.8–1.2)
Prothrombin Time: 13.4 seconds (ref 11.4–15.2)

## 2023-02-05 LAB — GLUCOSE, CAPILLARY
Glucose-Capillary: 160 mg/dL — ABNORMAL HIGH (ref 70–99)
Glucose-Capillary: 177 mg/dL — ABNORMAL HIGH (ref 70–99)
Glucose-Capillary: 171 mg/dL — ABNORMAL HIGH (ref 70–99)
Glucose-Capillary: 193 mg/dL — ABNORMAL HIGH (ref 70–99)

## 2023-02-05 LAB — CBC
HCT: 44.1 % (ref 39.0–52.0)
Hemoglobin: 14.3 g/dL (ref 13.0–17.0)
MCH: 29.3 pg (ref 26.0–34.0)
MCHC: 32.4 g/dL (ref 30.0–36.0)
MCV: 90.4 fL (ref 80.0–100.0)
Platelets: 224 10*3/uL (ref 150–400)
RBC: 4.88 MIL/uL (ref 4.22–5.81)
RDW: 15.1 % (ref 11.5–15.5)
WBC: 12.6 10*3/uL — ABNORMAL HIGH (ref 4.0–10.5)
nRBC: 0 % (ref 0.0–0.2)

## 2023-02-05 LAB — HEPARIN LEVEL (UNFRACTIONATED)
Heparin Unfractionated: <0.1 [IU]/mL — ABNORMAL LOW (ref 0.30–0.70)
Heparin Unfractionated: 0.27 [IU]/mL — ABNORMAL LOW (ref 0.30–0.70)

## 2023-02-05 LAB — MAGNESIUM: Magnesium: 2 mg/dL (ref 1.7–2.4)

## 2023-02-05 MED ORDER — HEPARIN (PORCINE) 25000 UT/250ML-% IV SOLN
2000.0000 [IU]/h | INTRAVENOUS | Status: DC
  Administered 2023-02-05: 1800 [IU]/h via INTRAVENOUS
  Administered 2023-02-06 – 2023-02-07 (×3): 2000 [IU]/h via INTRAVENOUS
  Filled 2023-02-05 (×4): qty 250

## 2023-02-05 NOTE — Progress Notes (Addendum)
ANTICOAGULATION CONSULT NOTE - Initial Consult  Pharmacy Consult for heparin Indication: atrial fibrillation  Allergies  Allergen Reactions   Vancomycin Other (See Comments)    "Red Man Syndrome" 02/02/17: possible cause for rash under both arms   Niacin And Related Other (See Comments)    Red man syndrome   Tubersol [Tuberculin, Ppd] Other (See Comments)    Reaction unknown   Doxycycline Rash and Other (See Comments)    Patient Measurements: Height: 6\' 2"  (188 cm) Weight: (!) 163.1 kg (359 lb 8 oz) IBW/kg (Calculated) : 82.2 Heparin Dosing Weight: 120 kg  Vital Signs: Temp: 97.8 F (36.6 C) (06/30 0544) Temp Source: Oral (06/30 0544) BP: 98/66 (06/30 0811) Pulse Rate: 62 (06/30 0823)  Labs: Recent Labs    02/03/23 0244 02/03/23 1429 02/04/23 0218 02/05/23 0250 02/05/23 1147  HGB 13.4  --  14.1 14.3  --   HCT 41.6  --  43.2 44.1  --   PLT 265  --  285 224  --   APTT  --   --   --   --  21*  LABPROT  --   --   --   --  13.4  INR  --   --   --   --  1.0  HEPARINUNFRC  --   --   --   --  <0.10*  CREATININE 1.05 0.93 0.93 1.03  --     Estimated Creatinine Clearance: 115.9 mL/min (by C-G formula based on SCr of 1.03 mg/dL).   Medical History: Past Medical History:  Diagnosis Date   Acquired dilation of ascending aorta and aortic root (HCC)    40mm by echo 01/2021   Adenomatous colon polyp 2007   Anemia    Anxiety    Asthma    BPH without obstruction/lower urinary tract symptoms 02/22/2017   Chronic diastolic (congestive) heart failure (HCC)    Chronic venous stasis 03/07/2019   COPD (chronic obstructive pulmonary disease) (HCC)    Coronary artery calcification seen on CAT scan    Depression    Diabetic neuropathy (HCC) 09/11/2019   History of colon polyps 08/24/2018   Hypertension    Morbid obesity (HCC)    OSA (obstructive sleep apnea)    Pain due to onychomycosis of toenails of both feet 09/11/2019   Peripheral neuropathy 02/22/2017   Primary  osteoarthritis, left shoulder 03/05/2017   PTSD (post-traumatic stress disorder)    Pure hypercholesterolemia    QT prolongation 03/07/2019   Seizures (HCC)    Severe aortic stenosis    Sinus tachycardia 03/07/2019   Sleep apnea    CPAP   Type 2 diabetes mellitus with vascular disease (HCC) 09/11/2019    Medications:  Medications Prior to Admission  Medication Sig Dispense Refill Last Dose   acetaminophen (TYLENOL) 650 MG CR tablet Take 1,300 mg by mouth every 8 (eight) hours as needed for pain.   01/22/2023   albuterol (VENTOLIN HFA) 108 (90 Base) MCG/ACT inhaler Inhale 2 puffs into the lungs every 6 (six) hours as needed for wheezing or shortness of breath. 1 each 6 Past Month   allopurinol (ZYLOPRIM) 100 MG tablet Take 200 mg by mouth 2 (two) times daily.   01/22/2023   ARIPiprazole (ABILIFY) 5 MG tablet TAKE ONE TABLET BY MOUTH EVERYDAY AT BEDTIME (Patient taking differently: Take 5 mg by mouth at bedtime.) 90 tablet 3 Past Week   atorvastatin (LIPITOR) 40 MG tablet Take 1 tablet (40 mg total) by mouth daily. 90  tablet 3 01/22/2023   busPIRone (BUSPAR) 10 MG tablet Take 1 tablet (10 mg total) by mouth 3 (three) times daily. 270 tablet 3 01/22/2023   cetirizine (ZYRTEC) 10 MG tablet Take 10 mg by mouth at bedtime.    Past Week   Cholecalciferol (VITAMIN D3 PO) Take 2,000 Units by mouth daily.   01/22/2023   dapagliflozin propanediol (FARXIGA) 10 MG TABS tablet TAKE ONE TABLET BY MOUTH BEFORE BREAKFAST (Patient taking differently: Take 10 mg by mouth daily with breakfast.) 90 tablet 3 01/22/2023   diltiazem (CARDIZEM CD) 120 MG 24 hr capsule TAKE ONE CAPSULE BY MOUTH ONCE DAILY 30 capsule 2 01/22/2023   ELIQUIS 5 MG TABS tablet TAKE ONE TABLET BY MOUTH TWICE DAILY (Patient taking differently: Take 5 mg by mouth 2 (two) times daily.) 180 tablet 2 01/22/2023 at 0800   fenofibrate (TRICOR) 145 MG tablet Take 1 tablet (145 mg total) by mouth daily. 90 tablet 3 01/22/2023   ferrous sulfate 324 (65 Fe)  MG TBEC Take 324 mg by mouth daily with breakfast.    01/22/2023   fluticasone (FLONASE) 50 MCG/ACT nasal spray Place 2 sprays into both nostrils daily.    01/22/2023   Fluticasone-Umeclidin-Vilant (TRELEGY ELLIPTA) 200-62.5-25 MCG/ACT AEPB INHALE 1 PUFF BY MOUTH INTO LUNGS ONCE DAILY (Patient taking differently: Inhale 1 puff into the lungs daily.) 60 each 11 01/22/2023   Lacosamide 150 MG TABS Take 1 tablet (150 mg total) by mouth 2 (two) times daily. 180 tablet 3 01/22/2023   levETIRAcetam (KEPPRA) 500 MG tablet Take 1 tablet (500 mg total) by mouth 2 (two) times daily. 180 tablet 3 01/22/2023   losartan (COZAAR) 25 MG tablet TAKE ONE TABLET BY MOUTH ONCE DAILY 30 tablet 2 01/22/2023   metFORMIN (GLUCOPHAGE-XR) 500 MG 24 hr tablet Take 4 tablets (2,000 mg total) by mouth daily. 360 tablet 3 01/22/2023   metolazone (ZAROXOLYN) 2.5 MG tablet Take 1 tablet (2.5 mg total) by mouth once a week. Every Tuesday 20 tablet 3 Past Week   metoprolol succinate (TOPROL-XL) 25 MG 24 hr tablet Take 1 tablet (25 mg total) by mouth daily. 90 tablet 1 01/22/2023 at 0800   mirtazapine (REMERON) 15 MG tablet TAKE ONE TABLET BY MOUTH EVERYDAY AT BEDTIME (Patient taking differently: Take 15 mg by mouth at bedtime.) 30 tablet 2 Past Week   omeprazole (PRILOSEC) 20 MG capsule Take 20 mg by mouth every morning.   01/22/2023   potassium chloride SA (KLOR-CON M) 20 MEQ tablet Take 2 tablets (40 mEq total) by mouth 2 (two) times daily. Except Tuesdays when you take ( 3 Tabs) 270 tablet 3 01/22/2023   prazosin (MINIPRESS) 2 MG capsule Take two tablets at bedtime. (Patient taking differently: Take 2 mg by mouth at bedtime.) 180 capsule 3 Past Week   pregabalin (LYRICA) 150 MG capsule Take 1 capsule (150 mg total) by mouth 3 (three) times daily. 270 capsule 3 01/22/2023   repaglinide (PRANDIN) 2 MG tablet TAKE TWO TABLETS BY MOUTH TWICE DAILY BEFORE A meal (Patient taking differently: Take 2 mg by mouth 2 (two) times daily before a  meal.) 120 tablet 0 01/22/2023   spironolactone (ALDACTONE) 25 MG tablet Take 0.5 tablets (12.5 mg total) by mouth daily. 45 tablet 3 01/22/2023   tirzepatide (MOUNJARO) 12.5 MG/0.5ML Pen Inject 12.5 mg into the skin once a week. 6 mL 0 Past Week   torsemide (DEMADEX) 20 MG tablet Take 5 tablets (100 mg total) by mouth 2 (two) times daily.  900 tablet 3 01/22/2023   traZODone (DESYREL) 100 MG tablet Take 200 mg by mouth at bedtime.   Past Week   VASCEPA 1 g capsule TAKE TWO CAPSULES BY MOUTH TWICE DAILY 120 capsule 5 01/22/2023   venlafaxine XR (EFFEXOR-XR) 75 MG 24 hr capsule Take 3 capsules (225 mg total) by mouth daily with breakfast. 270 capsule 3 01/22/2023   diclofenac Sodium (VOLTAREN) 1 % GEL Apply 4 g topically 4 (four) times daily. To right knee. (Patient not taking: Reported on 01/23/2023) 100 g 0 Not Taking   Insulin Pen Needle (B-D ULTRAFINE III SHORT PEN) 31G X 8 MM MISC Inject 1 each into the skin 3 (three) times daily before meals. Use as instructed 4X daily. 100 each 5 unknown    Assessment: Patient is a 67 year old male with history of Afib on Eliquis, CAD, CVA. Presented with severe aortic stenosis with plan for TAVR on 7/2. Last dose of apixaban 6/26 @ 0826. S/p dental extractions 6/28.  Baseline heparin level undetectable.  Goal of Therapy:  Heparin level 0.3-0.7 units/ml Monitor platelets by anticoagulation protocol: Yes   Plan:  Start heparin infusion at 1800 units/hr Check anti-Xa level in 6-8 hours and daily while on heparin Continue to monitor H&H and platelets F/u restart PTA apixaban   Thank you for allowing pharmacy to be a part of this patient's care.   Signe Colt, PharmD 02/05/2023 11:33 AM  **Pharmacist phone directory can be found on [email or web address] listed under [facility]**

## 2023-02-05 NOTE — Progress Notes (Signed)
Rounding Note    Patient Name: Lawrence [name] Date of Encounter: 02/05/2023  [facility] Cardiologist: Armanda Magic, MD   Subjective   Feels okay this morning. Continues to have mild pain following oral surgery. No chest pain or SOB.  Plan for TAVR on 7/2  Inpatient Medications    Scheduled Meds:  allopurinol  200 mg Oral BID   ARIPiprazole  5 mg Oral QHS   atorvastatin  40 mg Oral Daily   busPIRone  10 mg Oral TID   diclofenac Sodium  4 g Topical QID   diltiazem  120 mg Oral Daily   fenofibrate  160 mg Oral Daily   fluticasone furoate-vilanterol  1 puff Inhalation Daily   And   umeclidinium bromide  1 puff Inhalation Daily   insulin aspart  0-15 Units Subcutaneous TID WC   insulin aspart  0-5 Units Subcutaneous QHS   insulin aspart  3 Units Subcutaneous TID WC   insulin glargine-yfgn  30 Units Subcutaneous Daily   lacosamide  150 mg Oral BID   levETIRAcetam  500 mg Oral BID   lidocaine  1 patch Transdermal Q24H   metoprolol succinate  50 mg Oral Daily   mirtazapine  15 mg Oral QHS   mupirocin ointment  1 Application Nasal BID   pantoprazole  40 mg Oral BID   potassium chloride  60 mEq Oral Daily   prazosin  2 mg Oral QHS   pregabalin  150 mg Oral TID   spironolactone  12.5 mg Oral Daily   torsemide  20 mg Oral Daily   traZODone  200 mg Oral QHS   venlafaxine XR  225 mg Oral Q breakfast   Continuous Infusions:  PRN Meds: acetaminophen **OR** acetaminophen, guaiFENesin, hydrALAZINE, ipratropium-albuterol, melatonin, morphine injection, ondansetron (ZOFRAN) IV, oxyCODONE, senna-docusate   Vital Signs    Vitals:   02/04/23 0835 02/04/23 1540 02/04/23 1925 02/05/23 0544  BP:  108/70 110/81 106/65  Pulse:  73 70 69  Resp:   18 20  Temp:  98.3 F (36.8 C) 97.7 F (36.5 C) 97.8 F (36.6 C)  TempSrc:  Oral Oral Oral  SpO2: 93% 91% 91% 93%  Weight:    (!) 163.1 kg  Height:        Intake/Output Summary (Last 24 hours) at 02/05/2023 0658 Last  data filed at 02/05/2023 0545 Gross per 24 hour  Intake 1080 ml  Output 3355 ml  Net -2275 ml       02/05/2023    5:44 AM 02/04/2023    5:03 AM 02/03/2023    4:44 AM  Last 3 Weights  Weight (lbs) 359 lb 8 oz 357 lb 9.6 oz 359 lb 11.2 oz  Weight (kg) 163.068 kg 162.206 kg 163.159 kg      Telemetry    NSR, frequent PVCs - Personally Reviewed  ECG    No new tracing today - Personally Reviewed  Physical Exam   GEN: No acute distress.   Neck: No JVD Cardiac: RRR, 2/6 systolic murmur  Respiratory: Clear to auscultation bilaterally. GI: Morbidly obese, soft, nontender, non-distended  MS: Trace edema, chronic venous stasis changes Neuro:  Nonfocal  Psych: Normal affect   Labs    High Sensitivity Troponin:   Recent Labs  Lab 01/22/23 2054 01/22/23 2348  TROPONINIHS 8 8      Chemistry Recent Labs  Lab 02/03/23 0244 02/03/23 1429 02/04/23 0218 02/05/23 0250  NA 137 135 135 135  K 3.1* 4.0 3.5  3.6  CL 99 98 97* 95*  CO2 28 28 28 30   GLUCOSE 131* 208* 211* 170*  BUN 22 17 20  27*  CREATININE 1.05 0.93 0.93 1.03  CALCIUM 8.7* 8.9 9.0 9.0  MG 2.0  --  2.1 2.0  GFRNONAA >60 >60 >60 >60  ANIONGAP 10 9 10 10      Lipids No results for input(s): "CHOL", "TRIG", "HDL", "LABVLDL", "LDLCALC", "CHOLHDL" in the last 168 hours.  Hematology Recent Labs  Lab 02/03/23 0244 02/04/23 0218 02/05/23 0250  WBC 10.4 14.8* 12.6*  RBC 4.57 4.81 4.88  HGB 13.4 14.1 14.3  HCT 41.6 43.2 44.1  MCV 91.0 89.8 90.4  MCH 29.3 29.3 29.3  MCHC 32.2 32.6 32.4  RDW 15.1 14.9 15.1  PLT 265 285 224    Thyroid No results for input(s): "TSH", "FREET4" in the last 168 hours.  BNPNo results for input(s): "BNP", "PROBNP" in the last 168 hours.  DDimer No results for input(s): "DDIMER" in the last 168 hours.   Radiology    No results found.  Cardiac Studies   Cardiac Studies & Procedures   CARDIAC CATHETERIZATION  CARDIAC CATHETERIZATION 01/26/2023  Narrative   Prox LAD to Mid  LAD lesion is 40% stenosed.   Prox Cx to Mid Cx lesion is 20% stenosed.   Ost 2nd Mrg lesion is 20% stenosed.   Mid RCA lesion is 40% stenosed.   Prox Cx lesion is 20% stenosed.  1.  Mild to moderate diffuse obstructive coronary artery disease without high-grade obstructions. 2.  Fick cardiac output of 9.7 L/min and Fick cardiac index of 3.5 L/min/m with the following hemodynamics: Right atrial pressure mean of 9 mmHg Right ventricular pressure 36/4 with an end-diastolic pressure of 16 mmHg Mean wedge pressure of 14 mmHg Pulmonary artery pressure of 34/15 with a mean of 24 mmHg  Recommendation: Continue evaluation for aortic valve intervention.  Findings Coronary Findings Diagnostic  Dominance: Right  Left Main Vessel is normal in caliber and large. Vessel is angiographically normal.  Left Anterior Descending Vessel is normal in caliber. Moderate to Large Caliber There is mild diffuse disease throughout the vessel. Prox LAD to Mid LAD lesion is 40% stenosed. The lesion is focal, tubular and smooth.  First Diagonal Branch Vessel is moderate in size. Vessel is angiographically normal.  Second Diagonal Branch Vessel is large in size. Vessel is angiographically normal.  Third Diagonal Branch Vessel is small in size. Vessel is angiographically normal.  Left Circumflex Vessel is large. Vessel is angiographically normal. The vessel is moderately ectatic. Prox Cx lesion is 20% stenosed. Prox Cx to Mid Cx lesion is 20% stenosed.  First Obtuse Marginal Branch Vessel is small in size. Vessel is angiographically normal.  Second Obtuse Marginal Branch Vessel is moderate in size. Vessel is angiographically normal. Ost 2nd Mrg lesion is 20% stenosed.  Right Coronary Artery Vessel is normal in caliber and large. Very Large Ectatic vessel (especially in the distal 1/3) There is mild diffuse disease throughout the vessel. The vessel is moderately ectatic. Mid RCA lesion is 40%  stenosed.  Inferior Septal Vessel is small in size. Vessel is angiographically normal.  Right Posterior Atrioventricular Artery Vessel is moderate in size. Vessel is angiographically normal.  First Right Posterolateral Branch Vessel is small in size. Vessel is angiographically normal.  Intervention  No interventions have been documented.   CARDIAC CATHETERIZATION  CARDIAC CATHETERIZATION 08/26/2021  Narrative 1. Elevated R>L heart filling pressures. 2. Preserved cardiac output. 3. Pulmonary venous hypertension  4. Successful Cardiomems pressure sensor deployment.  I will increase torsemide to 80 qam/60 qpm and increase KCl to 40 bid.  He will need to start Eliquis tomorrow morning.   STRESS TESTS  MYOCARDIAL PERFUSION IMAGING 11/26/2020  Narrative  Nuclear stress EF: 55%.  The left ventricular ejection fraction is normal (55-65%).  There was no ST segment deviation noted during stress.  The study is normal.  This is a low risk study.  ECHOCARDIOGRAM  ECHOCARDIOGRAM COMPLETE 12/22/2022  Narrative ECHOCARDIOGRAM REPORT    Patient Name:   JONAEL DARLEY Date of Exam: 12/22/2022 Medical Rec #:  [id number]        Height:       74.0 in Accession #:    [id number]       Weight:       360.0 lb Date of Birth:  05/02/1956        BSA:          2.790 m Patient Age:    66 years         BP:           100/60 mmHg Patient Gender: M                HR:           89 bpm. Exam Location:  Outpatient  Procedure: 2D Echo, Color Doppler and Cardiac Doppler  Indications:    I35.0 Aortic Stenosis  History:        Patient has prior history of Echocardiogram examinations. Asthma and COPD, Aortic Stenosis; Risk Factors:Diabetes, Hypertension and Sleep Apnea.  Sonographer:    L. Thornton-Maynard Referring Phys: [id number] DALTON S MCLEAN  IMPRESSIONS   1. Left ventricular ejection fraction, by estimation, is 60 to 65%. The left ventricle has normal function. The left ventricle has no  regional wall motion abnormalities. There is mild left ventricular hypertrophy. Left ventricular diastolic parameters were normal. 2. Right ventricular systolic function is normal. The right ventricular size is normal. 3. Left atrial size was moderately dilated. 4. The mitral valve is abnormal. No evidence of mitral valve regurgitation. No evidence of mitral stenosis. 5. AS has progressed since TTE done 05/2022. The aortic valve was not well visualized. There is moderate calcification of the aortic valve. There is moderate thickening of the aortic valve. Aortic valve regurgitation is not visualized. Severe aortic valve stenosis. 6. Aortic dilatation noted. There is mild dilatation of the aortic root, measuring 38 mm. There is mild dilatation of the ascending aorta, measuring 40 mm. 7. The inferior vena cava is normal in size with greater than 50% respiratory variability, suggesting right atrial pressure of 3 mmHg.  FINDINGS Left Ventricle: Left ventricular ejection fraction, by estimation, is 60 to 65%. The left ventricle has normal function. The left ventricle has no regional wall motion abnormalities. The left ventricular internal cavity size was normal in size. There is mild left ventricular hypertrophy. Left ventricular diastolic parameters were normal.  Right Ventricle: The right ventricular size is normal. No increase in right ventricular wall thickness. Right ventricular systolic function is normal.  Left Atrium: Left atrial size was moderately dilated.  Right Atrium: Right atrial size was normal in size.  Pericardium: There is no evidence of pericardial effusion.  Mitral Valve: The mitral valve is abnormal. There is mild thickening of the mitral valve leaflet(s). There is mild calcification of the mitral valve leaflet(s). Mild mitral annular calcification. No evidence of mitral valve regurgitation. No evidence of mitral valve stenosis. MV  peak gradient, 3.5 mmHg. The mean mitral valve  gradient is 3.0 mmHg.  Tricuspid Valve: The tricuspid valve is normal in structure. Tricuspid valve regurgitation is not demonstrated. No evidence of tricuspid stenosis.  Aortic Valve: AS has progressed since TTE done 05/2022. The aortic valve was not well visualized. There is moderate calcification of the aortic valve. There is moderate thickening of the aortic valve. Aortic valve regurgitation is not visualized. Severe aortic stenosis is present. Aortic valve mean gradient measures 40.0 mmHg. Aortic valve peak gradient measures 65.7 mmHg. Aortic valve area, by VTI measures 1.03 cm.  Pulmonic Valve: The pulmonic valve was normal in structure. Pulmonic valve regurgitation is not visualized. No evidence of pulmonic stenosis.  Aorta: Aortic dilatation noted. There is mild dilatation of the aortic root, measuring 38 mm. There is mild dilatation of the ascending aorta, measuring 40 mm.  Venous: The inferior vena cava is normal in size with greater than 50% respiratory variability, suggesting right atrial pressure of 3 mmHg.  IAS/Shunts: No atrial level shunt detected by color flow Doppler.   LEFT VENTRICLE PLAX 2D LVIDd:         6.20 cm     Diastology LVIDs:         4.90 cm     LV e' medial:    5.66 cm/s LV PW:         1.20 cm     LV E/e' medial:  16.3 LV IVS:        1.30 cm     LV e' lateral:   7.62 cm/s LVOT diam:     2.10 cm     LV E/e' lateral: 12.1 LV SV:         84 LV SV Index:   30 LVOT Area:     3.46 cm  LV Volumes (MOD) LV vol d, MOD A2C: 60.4 ml LV vol d, MOD A4C: 89.7 ml LV vol s, MOD A2C: 17.0 ml LV vol s, MOD A4C: 22.3 ml LV SV MOD A2C:     43.4 ml LV SV MOD A4C:     89.7 ml LV SV MOD BP:      54.7 ml  RIGHT VENTRICLE             IVC RV S prime:     12.20 cm/s  IVC diam: 2.20 cm TAPSE (M-mode): 3.0 cm  LEFT ATRIUM             Index LA diam:        4.30 cm 1.54 cm/m LA Vol (A2C):   28.9 ml 10.36 ml/m LA Vol (A4C):   80.6 ml 28.89 ml/m LA Biplane Vol: 50.4 ml  18.06 ml/m AORTIC VALVE                     PULMONIC VALVE AV Area (Vmax):    1.03 cm      PV Vmax:       1.12 m/s AV Area (Vmean):   1.03 cm      PV Peak grad:  5.0 mmHg AV Area (VTI):     1.03 cm AV Vmax:           405.33 cm/s AV Vmean:          299.667 cm/s AV VTI:            0.817 m AV Peak Grad:      65.7 mmHg AV Mean Grad:      40.0 mmHg LVOT  Vmax:         121.00 cm/s LVOT Vmean:        88.900 cm/s LVOT VTI:          0.243 m LVOT/AV VTI ratio: 0.30  AORTA Ao Root diam: 4.00 cm Ao Asc diam:  4.00 cm Ao Arch diam: 4.5 cm  MITRAL VALVE MV Area (PHT): 2.56 cm    SHUNTS MV Area VTI:   3.55 cm    Systemic VTI:  0.24 m MV Peak grad:  3.5 mmHg    Systemic Diam: 2.10 cm MV Mean grad:  3.0 mmHg MV Vmax:       0.93 m/s MV Vmean:      77.6 cm/s MV Decel Time: 296 msec MV E velocity: 92.20 cm/s MV A velocity: 80.70 cm/s MV E/A ratio:  1.14  Charlton Haws MD Electronically signed by Charlton Haws MD Signature Date/Time: 12/22/2022/11:30:25 AM    Final  TEE  ECHO TEE 08/26/2021  Narrative TRANSESOPHOGEAL ECHO REPORT    Patient Name:   [name] Date of Exam: 08/26/2021 Medical Rec #:  [id number]        Height:       74.0 in Accession #:    [id number]       Weight:       384.0 lb Date of Birth:  July 04, 1956        BSA:          2.868 m Patient Age:    65 years         BP:           108/60 mmHg Patient Gender: M                HR:           90 bpm. Exam Location:  Inpatient  Procedure: Transesophageal Echo, Cardiac Doppler and Color Doppler  Indications:     Aortic Stenosis  History:         Patient has prior history of Echocardiogram examinations, most recent 08/10/2021. COPD, Signs/Symptoms:Shortness of Breath; Risk Factors:Diabetes, Sleep Apnea and Hypertension.  Sonographer:     Eulah Pont RDCS Referring Phys:  [id number] [facility] Diagnosing Phys: Wilfred Lacy  PROCEDURE: After discussion of the risks and benefits of a TEE, an informed consent  was obtained from the patient. The transesophogeal probe was passed without difficulty through the esophogus of the patient. Local oropharyngeal anesthetic was provided with Cetacaine. Sedation performed by different physician. The patient was monitored while under deep sedation. Anesthestetic sedation was provided intravenously by Anesthesiology: 464.21mg  of Propofol, 100mg  of Lidocaine. The patient's vital signs; including heart rate, blood pressure, and oxygen saturation; remained stable throughout the procedure. The patient developed no complications during the procedure.  IMPRESSIONS   1. Left ventricular ejection fraction, by estimation, is 60 to 65%. The left ventricle has normal function. The left ventricle has no regional wall motion abnormalities. There is mild left ventricular hypertrophy. 2. Right ventricular systolic function is normal. The right ventricular size is mildly enlarged. Tricuspid regurgitation signal is inadequate for assessing PA pressure. 3. The aortic valve is tricuspid. Aortic valve regurgitation is mild. Moderate aortic valve stenosis. Aortic valve area, by VTI measures 1.35 cm and by planimetry 1.39 cm^2. Aortic valve mean gradient measures 28.0 mmHg. 4. The mitral valve is normal in structure. Trivial mitral valve regurgitation. No evidence of mitral stenosis. 5. Left atrial size was mildly dilated. No left atrial/left atrial appendage thrombus was detected. 6.  Right atrial size was mildly dilated. 7. No PFO or ASD by color doppler.  FINDINGS Left Ventricle: Left ventricular ejection fraction, by estimation, is 60 to 65%. The left ventricle has normal function. The left ventricle has no regional wall motion abnormalities. The left ventricular internal cavity size was normal in size. There is mild left ventricular hypertrophy.  Right Ventricle: The right ventricular size is mildly enlarged. No increase in right ventricular wall thickness. Right ventricular systolic  function is normal. Tricuspid regurgitation signal is inadequate for assessing PA pressure.  Left Atrium: Left atrial size was mildly dilated. No left atrial/left atrial appendage thrombus was detected.  Right Atrium: Right atrial size was mildly dilated.  Pericardium: There is no evidence of pericardial effusion.  Mitral Valve: The mitral valve is normal in structure. Trivial mitral valve regurgitation. No evidence of mitral valve stenosis.  Tricuspid Valve: The tricuspid valve is normal in structure. Tricuspid valve regurgitation is trivial.  Aortic Valve: The aortic valve is tricuspid. Aortic valve regurgitation is mild. Moderate aortic stenosis is present. Aortic valve mean gradient measures 28.0 mmHg. Aortic valve peak gradient measures 44.0 mmHg. Aortic valve area, by VTI measures 1.35 cm.  Pulmonic Valve: The pulmonic valve was normal in structure. Pulmonic valve regurgitation is not visualized.  Aorta: The aortic root is normal in size and structure.  IAS/Shunts: No PFO or ASD by color doppler.   LEFT VENTRICLE PLAX 2D LVOT diam:     2.40 cm LV SV:         92 LV SV Index:   32 LVOT Area:     4.52 cm   AORTIC VALVE AV Area (Vmax):    1.40 cm AV Area (Vmean):   1.37 cm AV Area (VTI):     1.35 cm AV Vmax:           331.67 cm/s AV Vmean:          246.667 cm/s AV VTI:            0.682 m AV Peak Grad:      44.0 mmHg AV Mean Grad:      28.0 mmHg LVOT Vmax:         103.00 cm/s LVOT Vmean:        74.700 cm/s LVOT VTI:          0.203 m LVOT/AV VTI ratio: 0.30   SHUNTS Systemic VTI:  0.20 m Systemic Diam: 2.40 cm  Dalton McleanMD Electronically signed by Wilfred Lacy Signature Date/Time: 08/26/2021/3:04:43 PM    Final  MONITORS  CARDIAC EVENT MONITOR 12/11/2020  Narrative  Predominant rhythm is sinus tachycardia with heart rate average 101bpm and ranged from 75 to 158bpm.  Frequent bouts of paroxysmal atrial fibrillation and flutter with variable  conduction.  Freqent PACs, bigeminal PACs and atrial couplets.  SVT up to 9 seconds in duration ? atrial flutter with 2:1 conduction vs. atrial tachycardia.  Occasional PVCs, bigeminal and trigeminal PVCs, ventricular couplets and wide complex tachycardia up to 5 beats in a row  PVC load < 1%.  CT SCANS  CT CORONARY MORPH W/CTA COR W/SCORE 01/30/2023  Addendum 01/30/2023  2:12 PM ADDENDUM REPORT: 01/30/2023 14:10  CLINICAL DATA:  73M with severe aortic stenosis being evaluated for a TAVR procedure.  EXAM: Cardiac TAVR CT  TECHNIQUE: The patient was scanned on a [employer]. A 120 kV retrospective scan was triggered in the descending thoracic aorta at 111 HU's. Gantry rotation speed was 250 msecs and collimation was .  6 mm. No beta blockade or nitro were given. The 3D data set was reconstructed in 5% intervals of the R-R cycle. Systolic and diastolic phases were analyzed on a dedicated work station using MPR, MIP and VRT modes. The patient received 100 cc of contrast.  FINDINGS: Aortic Root:  Aortic valve: Trileaflet  Aortic annulus:  Diameter: 29mm x 27mm  Perimeter: 92mm  Area: 657 mm^2  Calcifications: No calcifications  Coronary height: Min Left - 16mm, Min Right - 17mm  Sinotubular height: Left cusp - 24mm; Right cusp - 27mm; Noncoronary cusp - 29mm  LVOT (as measured 3 mm below the annulus):  Diameter: 30mm x 27mm  Area: 735 mm^2  Calcifications: No calcifications  Aortic sinus width: Left cusp - 37mm; Right cusp - 37mm; Noncoronary cusp - 37mm  Sinotubular junction width: 34mm x 33mm  Optimum Fluoroscopic Angle for Delivery: RAO 5 CRA 10  IMPRESSION: 1. Poor contrast opacification, which affects accuracy of TAVR measurements  2.  Tricuspid aortic valve with severe calcifications  3. Aortic annulus measures 29mm x 27mm in diameter with perimeter 92mm and area 657 mm^2. No annular or LVOT calcifications. Annular measurements are  suitable for placement of 29mm Sapien 3 valve  4. Sufficient coronary to annulus distance.  5. Optimum Fluoroscopic Angle for Delivery:  RAO 5 CRA 10  6. See prior study on 01/27/23 for aortic valve and coronary calcium scores   Electronically Signed By: Epifanio Lesches M.D. On: 01/30/2023 14:10  Narrative EXAM: OVER-READ INTERPRETATION  CT CHEST  The following report is a limited chest CT over-read performed by radiologist Dr. Duanne Guess of [facility] on 01/30/2023. This over-read does not include interpretation of cardiac or coronary anatomy or pathology. The coronary CTA interpretation by the cardiologist is attached.  COMPARISON:  01/27/2023  FINDINGS: Stable heart size. No pericardial effusion. Image thoracic aorta is nonaneurysmal. Aortic and coronary artery atherosclerosis. Central pulmonary vasculature within normal limits.  Mildly enlarged subcarinal and bilateral hilar lymph nodes, unchanged. Atelectatic changes within the dependent lung fields. Imaged lung fields are otherwise clear. No acute bony or chest wall abnormality.  IMPRESSION: 1. No new or acute extracardiac findings. 2. Mildly enlarged subcarinal and bilateral hilar lymph nodes, unchanged. 3. Aortic and coronary artery atherosclerosis (ICD10-I70.0).  Electronically Signed: By: Duanne Guess D.O. On: 01/30/2023 13:19   CT CORONARY MORPH W/CTA COR W/SCORE 01/28/2023  Addendum 01/28/2023 11:32 AM ADDENDUM REPORT: 01/28/2023 11:30  EXAM: OVER-READ INTERPRETATION  CT CHEST  The following report is an over-read performed by radiologist Dr. Lesia Hausen [facility] on 01/28/2023. This over-read does not include interpretation of cardiac or coronary anatomy or pathology. The cardiac CTA interpretation by the cardiologist is attached.  COMPARISON:  10/26/2022 chest CT angiogram.  FINDINGS: Please see the separate concurrent chest CT angiogram report  for details.  IMPRESSION: Please see the separate concurrent chest CT angiogram report for details.   Electronically Signed By: Delbert Phenix M.D. On: 01/28/2023 11:30  Narrative CLINICAL DATA:  15M with severe aortic stenosis being evaluated for a TAVR procedure.  EXAM: Cardiac TAVR CT  TECHNIQUE: The patient was scanned on a [employer]. A 120 kV retrospective scan was triggered in the descending thoracic aorta at 111 HU's. Gantry rotation speed was 250 msecs and collimation was .6 mm. No beta blockade or nitro were given. The 3D data set was reconstructed in 5% intervals of the R-R cycle. Systolic and diastolic phases were analyzed on a dedicated work  station using MPR, MIP and VRT modes. The patient received 100 cc of contrast.  FINDINGS: Aortic valve: Trileaflet. Poor contrast opacification, unable to make TAVR measurements  Aortic valve calcium score: 3685  Cardiac:  Right atrium: Mild enlargement. Lipomatous hypertrophy of interatrial septum  Right ventricle: Mild enlargement  Pulmonary arteries: Normal size  Pulmonary veins: Normal configuration  Left atrium: Mild enlargement  Left ventricle: Normal size  Pericardium: Normal thickness  Coronary arteries: Calcium score 4290 (99th percentile)  IMPRESSION: 1. During contrast injection, IV pressure limit was reached, resulting in low contrast injection rate and very poor contrast opacification. Contrast is not sufficient to make TAVR measurements. Recommend repeating study.  2. Tricuspid aortic valve with severe calcifications (AV calcium score 3685)  3.  Coronary calcium score 4290 (99th percentile)  Electronically Signed: By: Epifanio Lesches M.D. On: 01/27/2023 17:48           Patient Profile     67 y.o. male with mixed picture hypoxic respiratory failure related to non-obstructive CAD,  chronic diastolic HF s/p cardiomems, paroxysmal atrial fibrillation on Eliquis (held  for oral surgery), severe aortic stenosis noted on recent Echo in 12/2022, hypertension, hyperlipidemia, type 2 diabetes mellitus with peripheral neuropathy, COPD, obstructive sleep apnea on CPAP, seizure disorder, and prior tobacco use (quit about 6 years ago) who presented with chest pain and SOB found to have acute on chronic diastolic HF in the setting of severe AS now planned for TAVR on 7/2.  Assessment & Plan    #Acute on Chronic Diastolic HF: #Acute Hypoxic Respiratory Failure: -TTE 12/22/22 with LVEF 60-65% with severe AS with mean gradient , AVA 1cm2 -Now presenting with worsening chest pain and SOB found to be volume overloaded in the setting of severe AS -Diuresed well to IV lasix  -Very brisk diuresis with PO torsemide and Cr/BUN rising slightly. Will hold tomorrows dose for now and reassess volume status in the morning -Continue spironolactone 12.5mg  daily -SGLT2i held prior to TAVR on Tuesday; will resume post-op -Manage severe AS as below  #Severe AS: -TTE with mean AoV gradient , AVA 1cm2 -Had oral surgery for poor dentition which he tolerated well -Now planned for TAVR on 7/2; patient does not wish to go home prior to the procedure as he states he will likely need to come back due to his chest discomfort  #Noncardiac Chest Pain: #Nonobstructive CAD: -Cath 01/2023 with mild to moderate diffuse nonobstructive CAD -Not on ASA due to need for [facility] -Continue lipitor 40mg  daily  #Paroxysmal Afib: #Frequent PVCs: -Continue dilt 120mg  daily -On heparin gtt in preparation for TAVR on 7/2; plan to transition back to home apixaban prior to discharge  #HTN: -Controlled today -Continue spiro 12.5mg  daily -Continue dilt 120mg  daily     For questions or updates, please contact [facility] Please consult [email or web address] for contact info under        Signed, Meriam Sprague, MD  02/05/2023, 6:58 AM

## 2023-02-05 NOTE — Plan of Care (Signed)

## 2023-02-05 NOTE — Progress Notes (Signed)
PROGRESS NOTE  Lawrence [name]  ZOX:[id number] DOB: March 06, 1956 DOA: 01/22/2023 PCP: [name], PA-C   Brief Narrative: Patient is a 67 year old male with history of hypertension, chronic diastolic CHF, paroxysmal A-fib on Eliquis, aortic stenosis, coronary artery disease, CVA, diabetes type 2, COPD, AAA, morbid obesity, OSA on CPAP who presented with shortness of breath, intermittent left-sided chest pain for 2 weeks.  Workup showed severe aortic stenosis.  Cardiology consulted, plan for TAVR on 7/2.  Due to poor dental condition, oral surgery was consulted and s/p  tooth extraction .  Assessment & Plan:  Principal Problem:   Acute hypoxic respiratory failure (HCC) Active Problems:   Chest pain   Chronic diastolic CHF (congestive heart failure) (HCC)   OSA (obstructive sleep apnea)   Type 2 diabetes mellitus with hyperlipidemia (HCC)   Essential hypertension   Seizure disorder (HCC)   AF (paroxysmal atrial fibrillation) (HCC)   COPD (chronic obstructive pulmonary disease) (HCC)  Acute hypoxic respiratory failure: Most likely multifactorial secondary to volume overload due to severe aortic stenosis/dCHF/OSA/obesity hypoventilation syndrome.  CTA chest negative for PE.  Continue bronchodilators as needed.  Not on oxygen at home.  Currently on 3 to 4 L of oxygen.  On torsemide, spironolactone, given a dose of metolazone  Acute on chronic diastolic CHF: Echocardiogram done in May showed EF of 65%, severe aortic stenosis.    Severe aortic stenosis: TAVR planned for 7/2.  Left heart cath negative for obstructive disease.  Cardiology, cardiothoracic surgery following  COPD: Continue home inhalers.  Continue bronchodilators as needed.  Not on oxygen at home.   Diabetes type 2: Takes insulin at home.  Recent A1c of 6.4.  Continue current insulin regimen.  Monitor blood sugars  Paroxysmal A-fib: Currently in normal sinus rhythm.  On Eliquis for anticoagulation.  On Cardizem for rate  control.  Plan is to start on heparin drip for progression for TAVR  Hypertension: Continue monitor blood pressure, continue current medications  Anxiety/depression: BuSpar, Abilify, venlafaxine  History of seizure disorder: On Keppra, lacosamide  History of OSA: On CPAP  Morbid obesity: BMI 47         DVT prophylaxis:SCDs Start: 01/23/23 0527     Code Status: DNR  Family Communication: Wife at bedside on 6/28  Patient status:Inpatient  Patient is from :Home  Anticipated discharge WJ:XBJY  Estimated DC date:after TAVR   Consultants: Cardiology  Procedures:None  Antimicrobials:  Anti-infectives (From admission, onward)    Start     Dose/Rate Route Frequency Ordered Stop   02/03/23 0715  ceFAZolin (ANCEF) IVPB 3g/100 mL premix        3 g 200 mL/hr over 30 Minutes Intravenous On call to O.R. 02/03/23 7829 02/03/23 0814   01/22/23 0000  cephALEXin (KEFLEX) 500 MG capsule        500 mg Oral 4 times daily 01/22/23 2347         Subjective: Patient seen and examined at bedside today.  Hemodynamically stable comfortable without any complaints.  Still on 3-4 L of oxygen per minute.  Denies any shortness of breath or cough.  Objective: Vitals:   02/05/23 0544 02/05/23 0658 02/05/23 0811 02/05/23 0823  BP: 106/65  98/66   Pulse: 69 73  62  Resp: 20  16 18   Temp: 97.8 F (36.6 C)     TempSrc: Oral     SpO2: 93% 94% 94% 94%  Weight: (!) 163.1 kg     Height:  Intake/Output Summary (Last 24 hours) at 02/05/2023 1055 Last data filed at 02/05/2023 1000 Gross per 24 hour  Intake 720 ml  Output 3755 ml  Net -3035 ml   Filed Weights   02/03/23 0444 02/04/23 0503 02/05/23 0544  Weight: (!) 163.2 kg (!) 162.2 kg (!) 163.1 kg    Examination:   General exam: Overall comfortable, not in distress, morbid obesity HEENT: PERRL Respiratory system:  no wheezes or crackles, diminished sounds on bases Cardiovascular system: S1 & S2 heard, RRR.   Gastrointestinal system: Abdomen is nondistended, soft and nontender. Central nervous system: Alert and oriented Extremities: No edema, no clubbing ,no cyanosis Skin: No rashes, no ulcers,no icterus     Data Reviewed: I have personally reviewed following labs and imaging studies  CBC: Recent Labs  Lab 01/31/23 0147 02/02/23 0249 02/03/23 0244 02/04/23 0218 02/05/23 0250  WBC 10.1 11.6* 10.4 14.8* 12.6*  HGB 12.6* 13.6 13.4 14.1 14.3  HCT 39.6 41.9 41.6 43.2 44.1  MCV 91.5 90.3 91.0 89.8 90.4  PLT 252 263 265 285 224   Basic Metabolic Panel: Recent Labs  Lab 02/01/23 1401 02/02/23 0249 02/02/23 1417 02/03/23 0244 02/03/23 1429 02/04/23 0218 02/05/23 0250  NA 134* 136 135 137 135 135 135  K 3.5 3.3* 3.9 3.1* 4.0 3.5 3.6  CL 96* 98 96* 99 98 97* 95*  CO2 26 29 28 28 28 28 30   GLUCOSE 175* 120* 213* 131* 208* 211* 170*  BUN 20 17 18 22 17 20  27*  CREATININE 0.97 0.86 1.27* 1.05 0.93 0.93 1.03  CALCIUM 8.9 8.7* 8.9 8.7* 8.9 9.0 9.0  MG 1.9 2.0  --  2.0  --  2.1 2.0     Recent Results (from the past 240 hour(s))  Surgical PCR screen     Status: Abnormal   Collection Time: 02/03/23  2:00 AM   Specimen: Nasal Mucosa; Nasal Swab  Result Value Ref Range Status   MRSA, PCR NEGATIVE NEGATIVE Final   Staphylococcus aureus POSITIVE (A) NEGATIVE Final    Comment: (NOTE) The Xpert SA Assay (FDA approved for NASAL specimens in patients 1 years of age and older), is one component of a comprehensive surveillance program. It is not intended to diagnose infection nor to guide or monitor treatment. Performed at [facility], [address], [place], [place] [address]      Radiology Studies: No results found.  Scheduled Meds:  allopurinol  200 mg Oral BID   ARIPiprazole  5 mg Oral QHS   atorvastatin  40 mg Oral Daily   busPIRone  10 mg Oral TID   diclofenac Sodium  4 g Topical QID   diltiazem  120 mg Oral Daily   fenofibrate  160 mg Oral Daily   fluticasone  furoate-vilanterol  1 puff Inhalation Daily   And   umeclidinium bromide  1 puff Inhalation Daily   insulin aspart  0-15 Units Subcutaneous TID WC   insulin aspart  0-5 Units Subcutaneous QHS   insulin aspart  3 Units Subcutaneous TID WC   insulin glargine-yfgn  30 Units Subcutaneous Daily   lacosamide  150 mg Oral BID   levETIRAcetam  500 mg Oral BID   lidocaine  1 patch Transdermal Q24H   metoprolol succinate  50 mg Oral Daily   mirtazapine  15 mg Oral QHS   mupirocin ointment  1 Application Nasal BID   pantoprazole  40 mg Oral BID   potassium chloride  60 mEq Oral Daily  prazosin  2 mg Oral QHS   pregabalin  150 mg Oral TID   spironolactone  12.5 mg Oral Daily   traZODone  200 mg Oral QHS   venlafaxine XR  225 mg Oral Q breakfast   Continuous Infusions:     LOS: 13 days   Burnadette Pop, MD [facility] P6/30/2024, 10:55 AM

## 2023-02-05 NOTE — Progress Notes (Addendum)
PROGRESS NOTE  Lawrence [name]  ZOX:[id number] DOB: 06/07/1956 DOA: 01/22/2023 PCP: [name], PA-C   Brief Narrative: Patient is a 67 year old male with history of hypertension, chronic diastolic CHF, paroxysmal A-fib on Eliquis, aortic stenosis, coronary artery disease, CVA, diabetes type 2, COPD, AAA, morbid obesity, OSA on CPAP who presented with shortness of breath, intermittent left-sided chest pain for 2 weeks.  Workup showed severe aortic stenosis.  Cardiology consulted, plan for TAVR on 7/2.  Due to poor dental condition, oral surgery was consulted and s/p  tooth extraction .  Assessment & Plan:  Principal Problem:   Acute hypoxic respiratory failure (HCC) Active Problems:   Chest pain   Chronic diastolic CHF (congestive heart failure) (HCC)   OSA (obstructive sleep apnea)   Type 2 diabetes mellitus with hyperlipidemia (HCC)   Essential hypertension   Seizure disorder (HCC)   AF (paroxysmal atrial fibrillation) (HCC)   COPD (chronic obstructive pulmonary disease) (HCC)  Acute hypoxic respiratory failure: Most likely multifactorial secondary to volume overload due to severe aortic stenosis/dCHF/OSA/obesity hypoventilation syndrome.  CTA chest negative for PE.  Continue bronchodilators as needed.  Not on oxygen at home.  Currently on 3 to 4 L of oxygen.  On torsemide, spironolactone, given a dose of metolazone  Acute on chronic diastolic CHF: Echocardiogram done in May showed EF of 65%, severe aortic stenosis.    Severe aortic stenosis: TAVR planned for 7/2.  Left heart cath negative for obstructive disease.  Cardiology, cardiothoracic surgery following  COPD: Continue home inhalers.  Continue bronchodilators as needed.  Not on oxygen at home.   Diabetes type 2: Takes insulin at home.  Recent A1c of 6.4.  Continue current insulin regimen.  Monitor blood sugars  Paroxysmal A-fib: Currently in normal sinus rhythm.  On Eliquis for anticoagulation.  On Cardizem for rate  control.  Plan is to start on heparin drip for progression for TAVR  Hypertension: Continue monitor blood pressure, continue current medications  Anxiety/depression: BuSpar, Abilify, venlafaxine  History of seizure disorder: On Keppra, lacosamide  History of OSA: On CPAP  Leukocytosis: Most likely due to, improving  Morbid obesity: BMI 47         DVT prophylaxis:SCDs Start: 01/23/23 0527     Code Status: DNR  Family Communication: Wife at bedside on 6/28  Patient status:Inpatient  Patient is from :Home  Anticipated discharge WJ:XBJY  Estimated DC date:after TAVR   Consultants: Cardiology  Procedures:None  Antimicrobials:  Anti-infectives (From admission, onward)    Start     Dose/Rate Route Frequency Ordered Stop   02/03/23 0715  ceFAZolin (ANCEF) IVPB 3g/100 mL premix        3 g 200 mL/hr over 30 Minutes Intravenous On call to O.R. 02/03/23 7829 02/03/23 0814   01/22/23 0000  cephALEXin (KEFLEX) 500 MG capsule        500 mg Oral 4 times daily 01/22/23 2347         Subjective: Seen and examined at bedside today.  Comfortable without any complaints.  Still on supplemental oxygen.  Denies shortness of breath or cough   Objective: Vitals:   02/05/23 0544 02/05/23 0658 02/05/23 0811 02/05/23 0823  BP: 106/65  98/66   Pulse: 69 73  62  Resp: 20  16 18   Temp: 97.8 F (36.6 C)     TempSrc: Oral     SpO2: 93% 94% 94% 94%  Weight: (!) 163.1 kg     Height:  Intake/Output Summary (Last 24 hours) at 02/05/2023 1100 Last data filed at 02/05/2023 1000 Gross per 24 hour  Intake 720 ml  Output 3755 ml  Net -3035 ml   Filed Weights   02/03/23 0444 02/04/23 0503 02/05/23 0544  Weight: (!) 163.2 kg (!) 162.2 kg (!) 163.1 kg    Examination:    General exam: Overall comfortable, not in distress, morbidly obese HEENT: PERRL Respiratory system:  no wheezes or crackles, diminished sounds on bases Cardiovascular system: S1 & S2 heard, RRR.   Gastrointestinal system: Abdomen is nondistended, soft and nontender. Central nervous system: Alert and oriented Extremities: No edema, no clubbing ,no cyanosis Skin: No rashes, no ulcers,no icterus     Data Reviewed: I have personally reviewed following labs and imaging studies  CBC: Recent Labs  Lab 01/31/23 0147 02/02/23 0249 02/03/23 0244 02/04/23 0218 02/05/23 0250  WBC 10.1 11.6* 10.4 14.8* 12.6*  HGB 12.6* 13.6 13.4 14.1 14.3  HCT 39.6 41.9 41.6 43.2 44.1  MCV 91.5 90.3 91.0 89.8 90.4  PLT 252 263 265 285 224   Basic Metabolic Panel: Recent Labs  Lab 02/01/23 1401 02/02/23 0249 02/02/23 1417 02/03/23 0244 02/03/23 1429 02/04/23 0218 02/05/23 0250  NA 134* 136 135 137 135 135 135  K 3.5 3.3* 3.9 3.1* 4.0 3.5 3.6  CL 96* 98 96* 99 98 97* 95*  CO2 26 29 28 28 28 28 30   GLUCOSE 175* 120* 213* 131* 208* 211* 170*  BUN 20 17 18 22 17 20  27*  CREATININE 0.97 0.86 1.27* 1.05 0.93 0.93 1.03  CALCIUM 8.9 8.7* 8.9 8.7* 8.9 9.0 9.0  MG 1.9 2.0  --  2.0  --  2.1 2.0     Recent Results (from the past 240 hour(s))  Surgical PCR screen     Status: Abnormal   Collection Time: 02/03/23  2:00 AM   Specimen: Nasal Mucosa; Nasal Swab  Result Value Ref Range Status   MRSA, PCR NEGATIVE NEGATIVE Final   Staphylococcus aureus POSITIVE (A) NEGATIVE Final    Comment: (NOTE) The Xpert SA Assay (FDA approved for NASAL specimens in patients 31 years of age and older), is one component of a comprehensive surveillance program. It is not intended to diagnose infection nor to guide or monitor treatment. Performed at [facility], [address], [place], [place] [address]      Radiology Studies: No results found.  Scheduled Meds:  allopurinol  200 mg Oral BID   ARIPiprazole  5 mg Oral QHS   atorvastatin  40 mg Oral Daily   busPIRone  10 mg Oral TID   diclofenac Sodium  4 g Topical QID   diltiazem  120 mg Oral Daily   fenofibrate  160 mg Oral Daily   fluticasone  furoate-vilanterol  1 puff Inhalation Daily   And   umeclidinium bromide  1 puff Inhalation Daily   insulin aspart  0-15 Units Subcutaneous TID WC   insulin aspart  0-5 Units Subcutaneous QHS   insulin aspart  3 Units Subcutaneous TID WC   insulin glargine-yfgn  30 Units Subcutaneous Daily   lacosamide  150 mg Oral BID   levETIRAcetam  500 mg Oral BID   lidocaine  1 patch Transdermal Q24H   metoprolol succinate  50 mg Oral Daily   mirtazapine  15 mg Oral QHS   mupirocin ointment  1 Application Nasal BID   pantoprazole  40 mg Oral BID   potassium chloride  60 mEq Oral  Daily   prazosin  2 mg Oral QHS   pregabalin  150 mg Oral TID   spironolactone  12.5 mg Oral Daily   traZODone  200 mg Oral QHS   venlafaxine XR  225 mg Oral Q breakfast   Continuous Infusions:     LOS: 13 days   Burnadette Pop, MD [facility] P6/30/2024, 11:00 AM

## 2023-02-05 NOTE — Progress Notes (Signed)
ANTICOAGULATION CONSULT NOTE - Initial Consult  Pharmacy Consult for heparin Indication: atrial fibrillation  Allergies  Allergen Reactions   Vancomycin Other (See Comments)    "Red Man Syndrome" 02/02/17: possible cause for rash under both arms   Niacin And Related Other (See Comments)    Red man syndrome   Tubersol [Tuberculin, Ppd] Other (See Comments)    Reaction unknown   Doxycycline Rash and Other (See Comments)    Patient Measurements: Height: 6\' 2"  (188 cm) Weight: (!) 163.1 kg (359 lb 8 oz) IBW/kg (Calculated) : 82.2 Heparin Dosing Weight: 120 kg  Vital Signs: Temp: 98 F (36.7 C) (06/30 1936) Temp Source: Oral (06/30 1936) BP: 100/63 (06/30 1936) Pulse Rate: 69 (06/30 1936)  Labs: Recent Labs    02/03/23 0244 02/03/23 1429 02/04/23 0218 02/05/23 0250 02/05/23 1147 02/05/23 2033  HGB 13.4  --  14.1 14.3  --   --   HCT 41.6  --  43.2 44.1  --   --   PLT 265  --  285 224  --   --   APTT  --   --   --   --  21*  --   LABPROT  --   --   --   --  13.4  --   INR  --   --   --   --  1.0  --   HEPARINUNFRC  --   --   --   --  <0.10* 0.27*  CREATININE 1.05 0.93 0.93 1.03  --   --      Estimated Creatinine Clearance: 115.9 mL/min (by C-G formula based on SCr of 1.03 mg/dL).   Medical History: Past Medical History:  Diagnosis Date   Acquired dilation of ascending aorta and aortic root (HCC)    40mm by echo 01/2021   Adenomatous colon polyp 2007   Anemia    Anxiety    Asthma    BPH without obstruction/lower urinary tract symptoms 02/22/2017   Chronic diastolic (congestive) heart failure (HCC)    Chronic venous stasis 03/07/2019   COPD (chronic obstructive pulmonary disease) (HCC)    Coronary artery calcification seen on CAT scan    Depression    Diabetic neuropathy (HCC) 09/11/2019   History of colon polyps 08/24/2018   Hypertension    Morbid obesity (HCC)    OSA (obstructive sleep apnea)    Pain due to onychomycosis of toenails of both feet  09/11/2019   Peripheral neuropathy 02/22/2017   Primary osteoarthritis, left shoulder 03/05/2017   PTSD (post-traumatic stress disorder)    Pure hypercholesterolemia    QT prolongation 03/07/2019   Seizures (HCC)    Severe aortic stenosis    Sinus tachycardia 03/07/2019   Sleep apnea    CPAP   Type 2 diabetes mellitus with vascular disease (HCC) 09/11/2019    Medications:  Medications Prior to Admission  Medication Sig Dispense Refill Last Dose   acetaminophen (TYLENOL) 650 MG CR tablet Take 1,300 mg by mouth every 8 (eight) hours as needed for pain.   01/22/2023   albuterol (VENTOLIN HFA) 108 (90 Base) MCG/ACT inhaler Inhale 2 puffs into the lungs every 6 (six) hours as needed for wheezing or shortness of breath. 1 each 6 Past Month   allopurinol (ZYLOPRIM) 100 MG tablet Take 200 mg by mouth 2 (two) times daily.   01/22/2023   ARIPiprazole (ABILIFY) 5 MG tablet TAKE ONE TABLET BY MOUTH EVERYDAY AT BEDTIME (Patient taking differently: Take 5 mg by  mouth at bedtime.) 90 tablet 3 Past Week   atorvastatin (LIPITOR) 40 MG tablet Take 1 tablet (40 mg total) by mouth daily. 90 tablet 3 01/22/2023   busPIRone (BUSPAR) 10 MG tablet Take 1 tablet (10 mg total) by mouth 3 (three) times daily. 270 tablet 3 01/22/2023   cetirizine (ZYRTEC) 10 MG tablet Take 10 mg by mouth at bedtime.    Past Week   Cholecalciferol (VITAMIN D3 PO) Take 2,000 Units by mouth daily.   01/22/2023   dapagliflozin propanediol (FARXIGA) 10 MG TABS tablet TAKE ONE TABLET BY MOUTH BEFORE BREAKFAST (Patient taking differently: Take 10 mg by mouth daily with breakfast.) 90 tablet 3 01/22/2023   diltiazem (CARDIZEM CD) 120 MG 24 hr capsule TAKE ONE CAPSULE BY MOUTH ONCE DAILY 30 capsule 2 01/22/2023   ELIQUIS 5 MG TABS tablet TAKE ONE TABLET BY MOUTH TWICE DAILY (Patient taking differently: Take 5 mg by mouth 2 (two) times daily.) 180 tablet 2 01/22/2023 at 0800   fenofibrate (TRICOR) 145 MG tablet Take 1 tablet (145 mg total) by mouth  daily. 90 tablet 3 01/22/2023   ferrous sulfate 324 (65 Fe) MG TBEC Take 324 mg by mouth daily with breakfast.    01/22/2023   fluticasone (FLONASE) 50 MCG/ACT nasal spray Place 2 sprays into both nostrils daily.    01/22/2023   Fluticasone-Umeclidin-Vilant (TRELEGY ELLIPTA) 200-62.5-25 MCG/ACT AEPB INHALE 1 PUFF BY MOUTH INTO LUNGS ONCE DAILY (Patient taking differently: Inhale 1 puff into the lungs daily.) 60 each 11 01/22/2023   Lacosamide 150 MG TABS Take 1 tablet (150 mg total) by mouth 2 (two) times daily. 180 tablet 3 01/22/2023   levETIRAcetam (KEPPRA) 500 MG tablet Take 1 tablet (500 mg total) by mouth 2 (two) times daily. 180 tablet 3 01/22/2023   losartan (COZAAR) 25 MG tablet TAKE ONE TABLET BY MOUTH ONCE DAILY 30 tablet 2 01/22/2023   metFORMIN (GLUCOPHAGE-XR) 500 MG 24 hr tablet Take 4 tablets (2,000 mg total) by mouth daily. 360 tablet 3 01/22/2023   metolazone (ZAROXOLYN) 2.5 MG tablet Take 1 tablet (2.5 mg total) by mouth once a week. Every Tuesday 20 tablet 3 Past Week   metoprolol succinate (TOPROL-XL) 25 MG 24 hr tablet Take 1 tablet (25 mg total) by mouth daily. 90 tablet 1 01/22/2023 at 0800   mirtazapine (REMERON) 15 MG tablet TAKE ONE TABLET BY MOUTH EVERYDAY AT BEDTIME (Patient taking differently: Take 15 mg by mouth at bedtime.) 30 tablet 2 Past Week   omeprazole (PRILOSEC) 20 MG capsule Take 20 mg by mouth every morning.   01/22/2023   potassium chloride SA (KLOR-CON M) 20 MEQ tablet Take 2 tablets (40 mEq total) by mouth 2 (two) times daily. Except Tuesdays when you take ( 3 Tabs) 270 tablet 3 01/22/2023   prazosin (MINIPRESS) 2 MG capsule Take two tablets at bedtime. (Patient taking differently: Take 2 mg by mouth at bedtime.) 180 capsule 3 Past Week   pregabalin (LYRICA) 150 MG capsule Take 1 capsule (150 mg total) by mouth 3 (three) times daily. 270 capsule 3 01/22/2023   repaglinide (PRANDIN) 2 MG tablet TAKE TWO TABLETS BY MOUTH TWICE DAILY BEFORE A meal (Patient taking  differently: Take 2 mg by mouth 2 (two) times daily before a meal.) 120 tablet 0 01/22/2023   spironolactone (ALDACTONE) 25 MG tablet Take 0.5 tablets (12.5 mg total) by mouth daily. 45 tablet 3 01/22/2023   tirzepatide (MOUNJARO) 12.5 MG/0.5ML Pen Inject 12.5 mg into the skin once a  week. 6 mL 0 Past Week   torsemide (DEMADEX) 20 MG tablet Take 5 tablets (100 mg total) by mouth 2 (two) times daily. 900 tablet 3 01/22/2023   traZODone (DESYREL) 100 MG tablet Take 200 mg by mouth at bedtime.   Past Week   VASCEPA 1 g capsule TAKE TWO CAPSULES BY MOUTH TWICE DAILY 120 capsule 5 01/22/2023   venlafaxine XR (EFFEXOR-XR) 75 MG 24 hr capsule Take 3 capsules (225 mg total) by mouth daily with breakfast. 270 capsule 3 01/22/2023   diclofenac Sodium (VOLTAREN) 1 % GEL Apply 4 g topically 4 (four) times daily. To right knee. (Patient not taking: Reported on 01/23/2023) 100 g 0 Not Taking   Insulin Pen Needle (B-D ULTRAFINE III SHORT PEN) 31G X 8 MM MISC Inject 1 each into the skin 3 (three) times daily before meals. Use as instructed 4X daily. 100 each 5 unknown    Assessment: Patient is a 67 year old male with history of Afib on Eliquis, CAD, CVA. Presented with severe aortic stenosis with plan for TAVR on 7/2. Last dose of apixaban 6/26 @ 0826. S/p dental extractions 6/28.   Heparin level 0.27 units/mL (subtherapeutic) on heparin 1800 units/hr  Goal of Therapy:  Heparin level 0.3-0.7 units/ml Monitor platelets by anticoagulation protocol: Yes   Plan:  Increase heparin to 2000 units/hr Check 8hr heparin level Daily CBC and heparin level. Monitor for signs/symptoms of bleeding F/u transitioning back to Eliquis  Thank you for allowing pharmacy to be a part of this patient's care.   Eldridge Scot, PharmD Clinical Pharmacist 02/05/2023 9:33 PM

## 2023-02-05 NOTE — Progress Notes (Signed)
Dr. Shirlee Latch requested transition to CHF team in AM, they will pick up tomorrow, round updated on e-list.

## 2023-02-05 NOTE — Progress Notes (Signed)
Mobility Specialist Progress Note:   02/05/23 1530  Mobility  Activity Transferred from chair to bed  Level of Assistance Minimal assist, patient does 75% or more  Assistive Device Front wheel walker  Distance Ambulated (ft) 5 ft  Activity Response Tolerated well  Mobility Referral Yes  $Mobility charge 1 Mobility  Mobility Specialist Start Time (ACUTE ONLY) 1530  Mobility Specialist Stop Time (ACUTE ONLY) 1542  Mobility Specialist Time Calculation (min) (ACUTE ONLY) 12 min   Pt requesting to transfer back to bed. Required minA to stand from low recliner, MinG to takes steps. Pt c/o minor LE soreness. Back in bed with all needs met, bed alarm on.   Addison Lank Mobility Specialist Please contact via SecureChat or  Rehab office at [phone]

## 2023-02-06 ENCOUNTER — Inpatient Hospital Stay (HOSPITAL_COMMUNITY): Payer: Medicare HMO

## 2023-02-06 DIAGNOSIS — J9601 Acute respiratory failure with hypoxia: Secondary | ICD-10-CM | POA: Diagnosis not present

## 2023-02-06 LAB — GLUCOSE, CAPILLARY
Glucose-Capillary: 154 mg/dL — ABNORMAL HIGH (ref 70–99)
Glucose-Capillary: 138 mg/dL — ABNORMAL HIGH (ref 70–99)
Glucose-Capillary: 212 mg/dL — ABNORMAL HIGH (ref 70–99)
Glucose-Capillary: 176 mg/dL — ABNORMAL HIGH (ref 70–99)

## 2023-02-06 LAB — URINALYSIS, COMPLETE (UACMP) WITH MICROSCOPIC
Bacteria, UA: NONE SEEN
Bilirubin Urine: NEGATIVE
Glucose, UA: 150 mg/dL — AB
Hgb urine dipstick: NEGATIVE
Ketones, ur: NEGATIVE mg/dL
Leukocytes,Ua: NEGATIVE
Nitrite: NEGATIVE
Protein, ur: NEGATIVE mg/dL
Specific Gravity, Urine: 1.018 (ref 1.005–1.030)
pH: 5 (ref 5.0–8.0)

## 2023-02-06 LAB — CBC
HCT: 39.6 % (ref 39.0–52.0)
Hemoglobin: 12.8 g/dL — ABNORMAL LOW (ref 13.0–17.0)
MCH: 29.4 pg (ref 26.0–34.0)
MCHC: 32.3 g/dL (ref 30.0–36.0)
MCV: 91 fL (ref 80.0–100.0)
Platelets: 213 10*3/uL (ref 150–400)
RBC: 4.35 MIL/uL (ref 4.22–5.81)
RDW: 15 % (ref 11.5–15.5)
WBC: 9.3 10*3/uL (ref 4.0–10.5)
nRBC: 0 % (ref 0.0–0.2)

## 2023-02-06 LAB — BASIC METABOLIC PANEL
Anion gap: 10 (ref 5–15)
BUN: 21 mg/dL (ref 8–23)
CO2: 26 mmol/L (ref 22–32)
Calcium: 8.6 mg/dL — ABNORMAL LOW (ref 8.9–10.3)
Chloride: 98 mmol/L (ref 98–111)
Creatinine, Ser: 0.83 mg/dL (ref 0.61–1.24)
GFR, Estimated: >60 mL/min (ref >60)
Glucose, Bld: 199 mg/dL — ABNORMAL HIGH (ref 70–99)
Potassium: 3.3 mmol/L — ABNORMAL LOW (ref 3.5–5.1)
Sodium: 134 mmol/L — ABNORMAL LOW (ref 135–145)

## 2023-02-06 LAB — HEPARIN LEVEL (UNFRACTIONATED): Heparin Unfractionated: 0.42 IU/mL (ref 0.30–0.70)

## 2023-02-06 LAB — MAGNESIUM: Magnesium: 1.8 mg/dL (ref 1.7–2.4)

## 2023-02-06 LAB — ABO/RH: ABO/RH(D): O POS

## 2023-02-06 MED ORDER — CEFAZOLIN IN SODIUM CHLORIDE 3-0.9 GM/100ML-% IV SOLN
3.0000 g | INTRAVENOUS | Status: AC
  Administered 2023-02-07: 3 g via INTRAVENOUS
  Filled 2023-02-06: qty 100

## 2023-02-06 MED ORDER — POTASSIUM CHLORIDE CRYS ER 20 MEQ PO TBCR
60.0000 meq | EXTENDED_RELEASE_TABLET | Freq: Once | ORAL | Status: AC
  Administered 2023-02-06: 60 meq via ORAL
  Filled 2023-02-06: qty 3

## 2023-02-06 MED ORDER — DEXMEDETOMIDINE HCL IN NACL 400 MCG/100ML IV SOLN
0.1000 ug/kg/h | INTRAVENOUS | Status: AC
  Administered 2023-02-07: 1 ug/kg/h via INTRAVENOUS
  Administered 2023-02-07: 82.8 ug via INTRAVENOUS
  Filled 2023-02-06: qty 100

## 2023-02-06 MED ORDER — NOREPINEPHRINE 4 MG/250ML-% IV SOLN
0.0000 ug/min | INTRAVENOUS | Status: DC
  Filled 2023-02-06: qty 250

## 2023-02-06 MED ORDER — MAGNESIUM SULFATE 2 GM/50ML IV SOLN
2.0000 g | Freq: Once | INTRAVENOUS | Status: AC
  Administered 2023-02-06: 2 g via INTRAVENOUS
  Filled 2023-02-06: qty 50

## 2023-02-06 MED ORDER — CHLORHEXIDINE GLUCONATE 0.12 % MT SOLN
15.0000 mL | Freq: Once | OROMUCOSAL | Status: DC
  Filled 2023-02-06: qty 15

## 2023-02-06 MED ORDER — HEPARIN 30,000 UNITS/1000 ML (OHS) CELLSAVER SOLUTION
Status: DC
  Filled 2023-02-06: qty 1000

## 2023-02-06 MED ORDER — BISACODYL 5 MG PO TBEC
5.0000 mg | DELAYED_RELEASE_TABLET | Freq: Once | ORAL | Status: AC
  Administered 2023-02-06: 5 mg via ORAL
  Filled 2023-02-06: qty 1

## 2023-02-06 MED ORDER — MAGNESIUM SULFATE 50 % IJ SOLN
40.0000 meq | INTRAMUSCULAR | Status: DC
  Filled 2023-02-06: qty 9.85

## 2023-02-06 MED ORDER — CHLORHEXIDINE GLUCONATE 4 % EX SOLN
1.0000 | Freq: Once | CUTANEOUS | Status: AC
  Administered 2023-02-06: 1 via TOPICAL
  Filled 2023-02-06 (×2): qty 15

## 2023-02-06 MED ORDER — TORSEMIDE 20 MG PO TABS
20.0000 mg | ORAL_TABLET | Freq: Once | ORAL | Status: AC
  Administered 2023-02-06: 20 mg via ORAL
  Filled 2023-02-06: qty 1

## 2023-02-06 MED ORDER — POTASSIUM CHLORIDE 2 MEQ/ML IV SOLN
80.0000 meq | INTRAVENOUS | Status: DC
  Filled 2023-02-06: qty 40

## 2023-02-06 MED ORDER — TEMAZEPAM 15 MG PO CAPS: 15.0000 mg | ORAL_CAPSULE | Freq: Once | ORAL | Status: DC | PRN

## 2023-02-06 NOTE — Telephone Encounter (Signed)
Pt currently admitted.

## 2023-02-06 NOTE — Plan of Care (Signed)

## 2023-02-06 NOTE — Progress Notes (Addendum)
Advanced Heart Failure Rounding Note  PCP-Cardiologist: Armanda Magic, MD   Subjective:    Planning for TAVR 07/02.  No labs this am.  No chest pain or dyspnea. Feeling much better. Ambulated twice yesterday.   ? Weight up 6 lb, doubt accurate. 4.8L UOP last 24 hrs.    Objective:   Weight Range: (!) 165.6 kg Body mass index is 46.86 kg/m.   Vital Signs:   Temp:  [97.5 F (36.4 C)-98 F (36.7 C)] 97.8 F (36.6 C) (07/01 0729) Pulse Rate:  [58-69] 62 (07/01 0729) Resp:  [16-18] 18 (07/01 0729) BP: (98-110)/(60-66) 110/60 (07/01 0729) SpO2:  [90 %-95 %] 95 % (07/01 0729) Weight:  [165.6 kg] 165.6 kg (07/01 0344) Last BM Date : 02/04/23  Weight change: Filed Weights   02/04/23 0503 02/05/23 0544 02/06/23 0344  Weight: (!) 162.2 kg (!) 163.1 kg (!) 165.6 kg    Intake/Output:   Intake/Output Summary (Last 24 hours) at 02/06/2023 0806 Last data filed at 02/06/2023 0421 Gross per 24 hour  Intake 263.78 ml  Output 4800 ml  Net -4536.22 ml      Physical Exam    General:  Sitting up in bed, no distress. HEENT: Normal Neck: Supple. JVP difficult d/t thick neck . Carotids 2+ bilat; no bruits.  Cor: PMI nondisplaced. Regular rate & rhythm. No rubs, gallops, 3/6 AS murmur. Lungs: Clear Abdomen: Obese, soft, nontender, nondistended.  Extremities: No cyanosis, clubbing, rash, 1+ edema, chronic venous stasis changes b/l LE. Neuro: Alert & orientedx3. Affect pleasant   Telemetry   SR 70s-80s, 1-2 PVCs/min  Labs    CBC Recent Labs    02/04/23 0218 02/05/23 0250  WBC 14.8* 12.6*  HGB 14.1 14.3  HCT 43.2 44.1  MCV 89.8 90.4  PLT 285 224   Basic Metabolic Panel Recent Labs    16/10/96 0218 02/05/23 0250  NA 135 135  K 3.5 3.6  CL 97* 95*  CO2 28 30  GLUCOSE 211* 170*  BUN 20 27*  CREATININE 0.93 1.03  CALCIUM 9.0 9.0  MG 2.1 2.0   Liver Function Tests No results for input(s): "AST", "ALT", "ALKPHOS", "BILITOT", "PROT", "ALBUMIN" in the last 72  hours. No results for input(s): "LIPASE", "AMYLASE" in the last 72 hours. Cardiac Enzymes No results for input(s): "CKTOTAL", "CKMB", "CKMBINDEX", "TROPONINI" in the last 72 hours.  BNP: BNP (last 3 results) Recent Labs    12/05/22 1514 01/12/23 1405 01/22/23 2054  BNP 19.3 19.5 25.3    ProBNP (last 3 results) No results for input(s): "PROBNP" in the last 8760 hours.   D-Dimer No results for input(s): "DDIMER" in the last 72 hours. Hemoglobin A1C No results for input(s): "HGBA1C" in the last 72 hours. Fasting Lipid Panel No results for input(s): "CHOL", "HDL", "LDLCALC", "TRIG", "CHOLHDL", "LDLDIRECT" in the last 72 hours. Thyroid Function Tests No results for input(s): "TSH", "T4TOTAL", "T3FREE", "THYROIDAB" in the last 72 hours.  Invalid input(s): "FREET3"  Other results:   Imaging    No results found.   Medications:     Scheduled Medications:  allopurinol  200 mg Oral BID   ARIPiprazole  5 mg Oral QHS   atorvastatin  40 mg Oral Daily   busPIRone  10 mg Oral TID   diclofenac Sodium  4 g Topical QID   diltiazem  120 mg Oral Daily   fenofibrate  160 mg Oral Daily   fluticasone furoate-vilanterol  1 puff Inhalation Daily   And   umeclidinium  bromide  1 puff Inhalation Daily   insulin aspart  0-15 Units Subcutaneous TID WC   insulin aspart  0-5 Units Subcutaneous QHS   insulin aspart  3 Units Subcutaneous TID WC   insulin glargine-yfgn  30 Units Subcutaneous Daily   lacosamide  150 mg Oral BID   levETIRAcetam  500 mg Oral BID   lidocaine  1 patch Transdermal Q24H   metoprolol succinate  50 mg Oral Daily   mirtazapine  15 mg Oral QHS   mupirocin ointment  1 Application Nasal BID   pantoprazole  40 mg Oral BID   potassium chloride  60 mEq Oral Daily   prazosin  2 mg Oral QHS   pregabalin  150 mg Oral TID   spironolactone  12.5 mg Oral Daily   traZODone  200 mg Oral QHS   venlafaxine XR  225 mg Oral Q breakfast    Infusions:  heparin 2,000 Units/hr  (02/06/23 0331)    PRN Medications: acetaminophen **OR** acetaminophen, guaiFENesin, hydrALAZINE, ipratropium-albuterol, melatonin, morphine injection, ondansetron (ZOFRAN) IV, oxyCODONE, senna-docusate    Patient Profile  Lawrence [name] is a 67 y.o. male with history of chronic diastolic CHF, PAF, OSA, morbid obesity, DM II, recent diagnosis severe AS.   Admitted with acute hypoxic respiratory failure secondary to a/c diastolic CHF.  Planning for TAVR.  Assessment/Plan  1. Acute on chronic diastolic CHF: Echo (11/22) with EF 60-65%, mild LVH, mildly dilated LV, moderate aortic stenosis (trileaflet aortic valve) with mean gradient 22 mmHg, DI 0.27.  Echo in 10/23 with EF 60-65%, mild LV dilation, normal RV, moderate AS with mean gradient 28 and AVA around 1 cm^2. Echo 12/22/22: EF 60-65%, RV okay, severe AS with mean gradient of 40 mmHg and AVA 1.03 cm2.  - RHC 01/26/23: mRA 9, PA 34/15(24), mPCWP 14, Fick CO/CI 9.7/3.5 - Admitted with NYHA IIIb/IV symptoms. He has been diuresed. Volume difficult on exam but does not look significantly overloaded. Diuretic stopped yesterday d/t rising Cr/BUN. Will give po Torsemide today if labs stable. Hendricks Limes held for pending TAVR - Continue spironolactone 12.5 mg daily. - On losartan at home, but not added back on admit. Currently no BP room.  2. Aortic stenosis: Moderate AS on TEE in 1/23 and echo in 10/23.  - Echo 05/24: EF 60-65%, severe AS with mean gradient of 40 mmHg and AVA 1.03 cm2. - Nonobstructive CAD on LHC 06/24 - Had multiple dentral extractions on 06/28 - Plan for TAVR on 07/02 3. Acute respiratory failure with hypoxia - 2/2 acute on chronic diastolic CHF +/- ? Component of OHS - Currently on 4L O2 [place], wean as tolerated 4. Atrial fibrillation: Paroxysmal. NSR today.  - On heparin in anticipation of TAVR. Switch back to Eliquis post procedure. - Continue Toprol XL - BP soft 90s-100s, stop cardizem CD 5. OSA/?OHS: Using Bipap nightly.   6. Obesity: Body mass index is 46. - Continue tirzepatide as outpatient, he has not lost weight yet with this.  7. Hyperlipidemia: On 40 mg atorvastatin and vascepa.  - LDL 60 and triglycerides 239 in 04/24. - Continue current management. 8. Seizures  - On Keppra per Neurology  9. DM II: On insulin - A1c 6.4% 06/24 - Resume Farxiga post TAVR - Insulin per primary team 10. HTN: BP soft - Stopped diltiazem CD d/t soft BP. Continue Toprol XL and spiro - Losartan not restarted on admit  ADDENDUM 10:25 AM: -Scr stable at 0.8, K 3.3, Mag 1.8 - Supp K and  Mag, give 20 mg Torsemide  Length of Stay: 14  FINCH, LINDSAY N, PA-C  02/06/2023, 8:06 AM  Advanced Heart Failure Team Pager [phone] (M-F; 7a - 5p)  Please contact [facility] for night-coverage after hours (5p -7a ) and weekends on [email or web address]   Patient seen with PA, agree with the above note.   Still gets short of breath with exertion. Wearing 3L [place].  Excellent UOP yesterday, has had a lot of fluid out since admission.  Creatinine stable at 0.83.   General: NAD Neck: JVP difficult, no thyromegaly or thyroid nodule.  Lungs: Clear to auscultation bilaterally with normal respiratory effort. CV: Nondisplaced PMI.  Heart regular S1/S2, no S3/S4, 3/6 SEM RUSB.  Trace ankle edema.  Abdomen: Soft, nontender, no hepatosplenomegaly, no distention.  Skin: Intact without lesions or rashes.  Neurologic: Alert and oriented x 3.  Psych: Normal affect. Extremities: No clubbing or cyanosis.  HEENT: Normal.   Agree with stopping diltiazem CD with soft BP, continue Toprol XL.   Volume status difficult, but he has had vigorous UOP on relatively low dose torsemide.  He has been getting torsemide 20 mg daily though he has been on 100 mg bid at home (confirmed with him).  Will give 20 mg torsemide today prior to TAVR tomorrow.  After TAVR, will reassess long-term diuretic dosing.   Plan for TAVR tomorrow for severe AS.   Marca Ancona 02/06/2023 12:04 PM

## 2023-02-06 NOTE — Progress Notes (Signed)
PROGRESS NOTE  Lawrence [name]  UJW:[id number] DOB: Nov 11, 1955 DOA: 01/22/2023 PCP: [name], PA-C   Brief Narrative: Patient is a 67 year old male with history of hypertension, chronic diastolic CHF, paroxysmal A-fib on Eliquis, aortic stenosis, coronary artery disease, CVA, diabetes type 2, COPD, AAA, morbid obesity, OSA on CPAP who presented with shortness of breath, intermittent left-sided chest pain for 2 weeks.  Workup showed severe aortic stenosis.   Due to poor dental condition, oral surgery was consulted and s/p  tooth extraction . Cardiology consulted, plan for TAVR on 7/2.  Assessment & Plan:  Principal Problem:   Acute hypoxic respiratory failure (HCC) Active Problems:   Chest pain   Chronic diastolic CHF (congestive heart failure) (HCC)   OSA (obstructive sleep apnea)   Type 2 diabetes mellitus with hyperlipidemia (HCC)   Essential hypertension   Seizure disorder (HCC)   AF (paroxysmal atrial fibrillation) (HCC)   COPD (chronic obstructive pulmonary disease) (HCC)  Acute hypoxic respiratory failure: Most likely multifactorial secondary to volume overload due to severe aortic stenosis/dCHF/OSA/obesity hypoventilation syndrome.  CTA chest negative for PE.  Continue bronchodilators as needed.  Not on oxygen at home.  Currently on 3 to 4 L of oxygen.  On torsemide, spironolactone, given a dose of metolazone earlier.  Acute on chronic diastolic CHF: Echocardiogram done in May showed EF of 65%, severe aortic stenosis.    Severe aortic stenosis: TAVR planned for 7/2.  Left heart cath negative for obstructive disease.  Cardiology, cardiothoracic surgery following  COPD: Continue home inhalers.  Continue bronchodilators as needed.  Not on oxygen at home.   Diabetes type 2: Takes insulin at home.  Recent A1c of 6.4.  Continue current insulin regimen.  Monitor blood sugars  Paroxysmal A-fib: Currently in normal sinus rhythm.  On Eliquis for anticoagulation.  On Cardizem for  rate control.  Plan is to start on heparin drip for preparation for TAVR  Hypertension: Continue monitor blood pressure, continue current medications  Anxiety/depression: BuSpar, Abilify, venlafaxine  History of seizure disorder: On Keppra, lacosamide  History of OSA: On CPAP  Hypokalemia: Being supplemented and being  monitored  Leukocytosis: Most likely due to, improving  Morbid obesity: BMI 47         DVT prophylaxis:SCDs Start: 01/23/23 0527     Code Status: DNR  Family Communication: Wife at bedside on 6/28  Patient status:Inpatient  Patient is from :Home  Anticipated discharge FA:OZHY  Estimated DC date:after TAVR   Consultants: Cardiology  Procedures:None  Antimicrobials:  Anti-infectives (From admission, onward)    Start     Dose/Rate Route Frequency Ordered Stop   02/07/23 0400  ceFAZolin (ANCEF) IVPB 3g/100 mL premix        3 g 200 mL/hr over 30 Minutes Intravenous To Surgery 02/06/23 1010 02/08/23 0400   02/03/23 0715  ceFAZolin (ANCEF) IVPB 3g/100 mL premix        3 g 200 mL/hr over 30 Minutes Intravenous On call to O.R. 02/03/23 8657 02/03/23 0814   01/22/23 0000  cephALEXin (KEFLEX) 500 MG capsule        500 mg Oral 4 times daily 01/22/23 2347         Subjective: Patient seen and examined at bedside today.  Hemodynamically stable.  Still on 3Lof  oxygen but denies worsening shortness of breath or chest pain.Had a good sleep   Objective: Vitals:   02/06/23 0130 02/06/23 0344 02/06/23 0729 02/06/23 0837  BP:  99/62 110/60   Pulse: (!) 58  60 62 73  Resp:  18 18 20   Temp:  (!) 97.5 F (36.4 C) 97.8 F (36.6 C)   TempSrc:  Oral Oral   SpO2: 90% 95% 95% 96%  Weight:  (!) 165.6 kg    Height:        Intake/Output Summary (Last 24 hours) at 02/06/2023 1056 Last data filed at 02/06/2023 0421 Gross per 24 hour  Intake 263.78 ml  Output 4400 ml  Net -4136.22 ml   Filed Weights   02/04/23 0503 02/05/23 0544 02/06/23 0344  Weight: (!)  162.2 kg (!) 163.1 kg (!) 165.6 kg    Examination:   General exam: Overall comfortable, not in distress,morbidly obese HEENT: PERRL Respiratory system:  no wheezes  ,mild crackles/diminished air sounds on bases Cardiovascular system: S1 & S2 heard, RRR.  Gastrointestinal system: Abdomen is nondistended, soft and nontender. Central nervous system: Alert and oriented Extremities: trace lower extremity edema, no clubbing ,no cyanosis Skin: No rashes, no ulcers,no icterus     Data Reviewed: I have personally reviewed following labs and imaging studies  CBC: Recent Labs  Lab 02/02/23 0249 02/03/23 0244 02/04/23 0218 02/05/23 0250 02/06/23 0818  WBC 11.6* 10.4 14.8* 12.6* 9.3  HGB 13.6 13.4 14.1 14.3 12.8*  HCT 41.9 41.6 43.2 44.1 39.6  MCV 90.3 91.0 89.8 90.4 91.0  PLT 263 265 285 224 213   Basic Metabolic Panel: Recent Labs  Lab 02/02/23 0249 02/02/23 1417 02/03/23 0244 02/03/23 1429 02/04/23 0218 02/05/23 0250 02/06/23 0818  NA 136   < > 137 135 135 135 134*  K 3.3*   < > 3.1* 4.0 3.5 3.6 3.3*  CL 98   < > 99 98 97* 95* 98  CO2 29   < > 28 28 28 30 26   GLUCOSE 120*   < > 131* 208* 211* 170* 199*  BUN 17   < > 22 17 20  27* 21  CREATININE 0.86   < > 1.05 0.93 0.93 1.03 0.83  CALCIUM 8.7*   < > 8.7* 8.9 9.0 9.0 8.6*  MG 2.0  --  2.0  --  2.1 2.0 1.8   < > = values in this interval not displayed.     Recent Results (from the past 240 hour(s))  Surgical PCR screen     Status: Abnormal   Collection Time: 02/03/23  2:00 AM   Specimen: Nasal Mucosa; Nasal Swab  Result Value Ref Range Status   MRSA, PCR NEGATIVE NEGATIVE Final   Staphylococcus aureus POSITIVE (A) NEGATIVE Final    Comment: (NOTE) The Xpert SA Assay (FDA approved for NASAL specimens in patients 68 years of age and older), is one component of a comprehensive surveillance program. It is not intended to diagnose infection nor to guide or monitor treatment. Performed at [facility], [address] [place], [place] [address]      Radiology Studies: No results found.  Scheduled Meds:  allopurinol  200 mg Oral BID   ARIPiprazole  5 mg Oral QHS   atorvastatin  40 mg Oral Daily   busPIRone  10 mg Oral TID   diclofenac Sodium  4 g Topical QID   fenofibrate  160 mg Oral Daily   fluticasone furoate-vilanterol  1 puff Inhalation Daily   And   umeclidinium bromide  1 puff Inhalation Daily   insulin aspart  0-15 Units Subcutaneous TID WC   insulin aspart  0-5 Units Subcutaneous QHS   insulin aspart  3  Units Subcutaneous TID WC   insulin glargine-yfgn  30 Units Subcutaneous Daily   lacosamide  150 mg Oral BID   levETIRAcetam  500 mg Oral BID   lidocaine  1 patch Transdermal Q24H   [START ON 02/07/2023] magnesium sulfate  40 mEq Other To OR   metoprolol succinate  50 mg Oral Daily   mirtazapine  15 mg Oral QHS   mupirocin ointment  1 Application Nasal BID   pantoprazole  40 mg Oral BID   [START ON 02/07/2023] potassium chloride  80 mEq Other To OR   potassium chloride  60 mEq Oral Daily   potassium chloride  60 mEq Oral Once   prazosin  2 mg Oral QHS   pregabalin  150 mg Oral TID   spironolactone  12.5 mg Oral Daily   traZODone  200 mg Oral QHS   venlafaxine XR  225 mg Oral Q breakfast   Continuous Infusions:  [START ON 02/07/2023]  ceFAZolin (ANCEF) IV     [START ON 02/07/2023] dexmedetomidine     [START ON 02/07/2023] heparin 30,000 units/NS 1000 mL solution for CELLSAVER     heparin 2,000 Units/hr (02/06/23 0331)   magnesium sulfate bolus IVPB 2 g (02/06/23 1045)   [START ON 02/07/2023] norepinephrine (LEVOPHED) Adult infusion        LOS: 14 days   Burnadette Pop, MD [facility] P7/08/2022, 10:56 AM

## 2023-02-06 NOTE — Progress Notes (Signed)
ANTICOAGULATION CONSULT NOTE  Pharmacy Consult for heparin Indication: atrial fibrillation  Allergies  Allergen Reactions   Vancomycin Other (See Comments)    "Red Man Syndrome" 02/02/17: possible cause for rash under both arms   Niacin And Related Other (See Comments)    Red man syndrome   Tubersol [Tuberculin, Ppd] Other (See Comments)    Reaction unknown   Doxycycline Rash and Other (See Comments)    Patient Measurements: Height: 6\' 2"  (188 cm) Weight: (!) 165.6 kg (365 lb) IBW/kg (Calculated) : 82.2 Heparin Dosing Weight: 120 kg  Vital Signs: Temp: 97.8 F (36.6 C) (07/01 0729) Temp Source: Oral (07/01 0729) BP: 110/60 (07/01 0729) Pulse Rate: 73 (07/01 0837)  Labs: Recent Labs    02/04/23 0218 02/05/23 0250 02/05/23 1147 02/05/23 2033 02/06/23 0818  HGB 14.1 14.3  --   --  12.8*  HCT 43.2 44.1  --   --  39.6  PLT 285 224  --   --  213  APTT  --   --  21*  --   --   LABPROT  --   --  13.4  --   --   INR  --   --  1.0  --   --   HEPARINUNFRC  --   --  <0.10* 0.27* 0.42  CREATININE 0.93 1.03  --   --  0.83     Estimated Creatinine Clearance: 145.1 mL/min (by C-G formula based on SCr of 0.83 mg/dL).   Medical History: Past Medical History:  Diagnosis Date   Acquired dilation of ascending aorta and aortic root (HCC)    40mm by echo 01/2021   Adenomatous colon polyp 2007   Anemia    Anxiety    Asthma    BPH without obstruction/lower urinary tract symptoms 02/22/2017   Chronic diastolic (congestive) heart failure (HCC)    Chronic venous stasis 03/07/2019   COPD (chronic obstructive pulmonary disease) (HCC)    Coronary artery calcification seen on CAT scan    Depression    Diabetic neuropathy (HCC) 09/11/2019   History of colon polyps 08/24/2018   Hypertension    Morbid obesity (HCC)    OSA (obstructive sleep apnea)    Pain due to onychomycosis of toenails of both feet 09/11/2019   Peripheral neuropathy 02/22/2017   Primary osteoarthritis, left  shoulder 03/05/2017   PTSD (post-traumatic stress disorder)    Pure hypercholesterolemia    QT prolongation 03/07/2019   Seizures (HCC)    Severe aortic stenosis    Sinus tachycardia 03/07/2019   Sleep apnea    CPAP   Type 2 diabetes mellitus with vascular disease (HCC) 09/11/2019    Medications:  Medications Prior to Admission  Medication Sig Dispense Refill Last Dose   acetaminophen (TYLENOL) 650 MG CR tablet Take 1,300 mg by mouth every 8 (eight) hours as needed for pain.   01/22/2023   albuterol (VENTOLIN HFA) 108 (90 Base) MCG/ACT inhaler Inhale 2 puffs into the lungs every 6 (six) hours as needed for wheezing or shortness of breath. 1 each 6 Past Month   allopurinol (ZYLOPRIM) 100 MG tablet Take 200 mg by mouth 2 (two) times daily.   01/22/2023   ARIPiprazole (ABILIFY) 5 MG tablet TAKE ONE TABLET BY MOUTH EVERYDAY AT BEDTIME (Patient taking differently: Take 5 mg by mouth at bedtime.) 90 tablet 3 Past Week   atorvastatin (LIPITOR) 40 MG tablet Take 1 tablet (40 mg total) by mouth daily. 90 tablet 3 01/22/2023   busPIRone (  BUSPAR) 10 MG tablet Take 1 tablet (10 mg total) by mouth 3 (three) times daily. 270 tablet 3 01/22/2023   cetirizine (ZYRTEC) 10 MG tablet Take 10 mg by mouth at bedtime.    Past Week   Cholecalciferol (VITAMIN D3 PO) Take 2,000 Units by mouth daily.   01/22/2023   dapagliflozin propanediol (FARXIGA) 10 MG TABS tablet TAKE ONE TABLET BY MOUTH BEFORE BREAKFAST (Patient taking differently: Take 10 mg by mouth daily with breakfast.) 90 tablet 3 01/22/2023   diltiazem (CARDIZEM CD) 120 MG 24 hr capsule TAKE ONE CAPSULE BY MOUTH ONCE DAILY 30 capsule 2 01/22/2023   ELIQUIS 5 MG TABS tablet TAKE ONE TABLET BY MOUTH TWICE DAILY (Patient taking differently: Take 5 mg by mouth 2 (two) times daily.) 180 tablet 2 01/22/2023 at 0800   fenofibrate (TRICOR) 145 MG tablet Take 1 tablet (145 mg total) by mouth daily. 90 tablet 3 01/22/2023   ferrous sulfate 324 (65 Fe) MG TBEC Take 324 mg  by mouth daily with breakfast.    01/22/2023   fluticasone (FLONASE) 50 MCG/ACT nasal spray Place 2 sprays into both nostrils daily.    01/22/2023   Fluticasone-Umeclidin-Vilant (TRELEGY ELLIPTA) 200-62.5-25 MCG/ACT AEPB INHALE 1 PUFF BY MOUTH INTO LUNGS ONCE DAILY (Patient taking differently: Inhale 1 puff into the lungs daily.) 60 each 11 01/22/2023   Lacosamide 150 MG TABS Take 1 tablet (150 mg total) by mouth 2 (two) times daily. 180 tablet 3 01/22/2023   levETIRAcetam (KEPPRA) 500 MG tablet Take 1 tablet (500 mg total) by mouth 2 (two) times daily. 180 tablet 3 01/22/2023   losartan (COZAAR) 25 MG tablet TAKE ONE TABLET BY MOUTH ONCE DAILY 30 tablet 2 01/22/2023   metFORMIN (GLUCOPHAGE-XR) 500 MG 24 hr tablet Take 4 tablets (2,000 mg total) by mouth daily. 360 tablet 3 01/22/2023   metolazone (ZAROXOLYN) 2.5 MG tablet Take 1 tablet (2.5 mg total) by mouth once a week. Every Tuesday 20 tablet 3 Past Week   metoprolol succinate (TOPROL-XL) 25 MG 24 hr tablet Take 1 tablet (25 mg total) by mouth daily. 90 tablet 1 01/22/2023 at 0800   mirtazapine (REMERON) 15 MG tablet TAKE ONE TABLET BY MOUTH EVERYDAY AT BEDTIME (Patient taking differently: Take 15 mg by mouth at bedtime.) 30 tablet 2 Past Week   omeprazole (PRILOSEC) 20 MG capsule Take 20 mg by mouth every morning.   01/22/2023   potassium chloride SA (KLOR-CON M) 20 MEQ tablet Take 2 tablets (40 mEq total) by mouth 2 (two) times daily. Except Tuesdays when you take ( 3 Tabs) 270 tablet 3 01/22/2023   prazosin (MINIPRESS) 2 MG capsule Take two tablets at bedtime. (Patient taking differently: Take 2 mg by mouth at bedtime.) 180 capsule 3 Past Week   pregabalin (LYRICA) 150 MG capsule Take 1 capsule (150 mg total) by mouth 3 (three) times daily. 270 capsule 3 01/22/2023   repaglinide (PRANDIN) 2 MG tablet TAKE TWO TABLETS BY MOUTH TWICE DAILY BEFORE A meal (Patient taking differently: Take 2 mg by mouth 2 (two) times daily before a meal.) 120 tablet 0  01/22/2023   spironolactone (ALDACTONE) 25 MG tablet Take 0.5 tablets (12.5 mg total) by mouth daily. 45 tablet 3 01/22/2023   tirzepatide (MOUNJARO) 12.5 MG/0.5ML Pen Inject 12.5 mg into the skin once a week. 6 mL 0 Past Week   torsemide (DEMADEX) 20 MG tablet Take 5 tablets (100 mg total) by mouth 2 (two) times daily. 900 tablet 3 01/22/2023  traZODone (DESYREL) 100 MG tablet Take 200 mg by mouth at bedtime.   Past Week   VASCEPA 1 g capsule TAKE TWO CAPSULES BY MOUTH TWICE DAILY 120 capsule 5 01/22/2023   venlafaxine XR (EFFEXOR-XR) 75 MG 24 hr capsule Take 3 capsules (225 mg total) by mouth daily with breakfast. 270 capsule 3 01/22/2023   diclofenac Sodium (VOLTAREN) 1 % GEL Apply 4 g topically 4 (four) times daily. To right knee. (Patient not taking: Reported on 01/23/2023) 100 g 0 Not Taking   Insulin Pen Needle (B-D ULTRAFINE III SHORT PEN) 31G X 8 MM MISC Inject 1 each into the skin 3 (three) times daily before meals. Use as instructed 4X daily. 100 each 5 unknown    Assessment: Patient is a 67 year old male with history of Afib on Eliquis, CAD, CVA. Presented with severe aortic stenosis with plan for TAVR on 7/2. Last dose of apixaban 6/26 @ 0826. S/p dental extractions 6/28.   Heparin level therapeutic at 0.42, CBC stable, TAVR tomorrow.  Goal of Therapy:  Heparin level 0.3-0.7 units/ml Monitor platelets by anticoagulation protocol: Yes   Plan:  Continue heparin 2000 units/h Daily heparin level and CBC  Fredonia Highland, PharmD, [place], [facility] Clinical Pharmacist [phone] Please check AMION for all [facility] numbers 02/06/2023

## 2023-02-06 NOTE — Progress Notes (Addendum)
  [facility]   MULTIDISCIPLINARY HEART VALVE TEAM   Inpatient pre TAVR orders were placed. NPO after midnight. Will try to transfer to 4E now as he will need a 4E bed after TAVR.   Cline Crock PA-C  [facility]

## 2023-02-06 NOTE — Progress Notes (Signed)
   02/06/23 2302  BiPAP/CPAP/SIPAP  BiPAP/CPAP/SIPAP Pt Type Adult  BiPAP/CPAP/SIPAP Resmed  Reason BIPAP/CPAP not in use Non-compliant  Flow Rate (S)  5 lpm (placed on 5L due to desat)  BiPAP/CPAP /SiPAP Vitals  Pulse Rate 73  SpO2 (!) 88 %  Bilateral Breath Sounds Clear;Diminished  MEWS Score/Color  MEWS Score 0  MEWS Score Color [place] Si

## 2023-02-06 NOTE — TOC Progression Note (Signed)
Transition of Care [facility]) - Progression Note    Patient Details  Name: Lawrence [name] MRN: [id number] Date of Birth: 31-Jan-1956  Transition of Care [facility]) CM/SW Contact  Graves-Bigelow, Lamar Laundry, RN Phone Number: 02/06/2023, 2:55 PM  Clinical Narrative: Patient was discussed in progression rounds. Plan for TAVR for 02-07-23. Case Manager will continue to follow for oxygen and additional transition of care needs as the patient progresses.     Expected Discharge Plan: Home w Home Health Services Barriers to Discharge: Continued Medical Work up  Expected Discharge Plan and Services In-house Referral: NA Discharge Planning Services: CM Consult Post Acute Care Choice: Home Health Living arrangements for the past 2 months: Apartment                 DME Arranged: N/A    HH Arranged: RN, Disease Management, PT, OT HH Agency: [facility] Date HH Agency Contacted: 01/25/23 Time HH Agency Contacted: 1624 Representative spoke with at [facility] Agency: Tresa Endo  Social Determinants of Health (SDOH) Interventions SDOH Screenings   Food Insecurity: Food Insecurity Present (01/23/2023)  Housing: Medium Risk (01/23/2023)  Transportation Needs: No Transportation Needs (01/23/2023)  Utilities: Not At Risk (01/23/2023)  Alcohol Screen: Low Risk  (06/08/2021)  Financial Resource Strain: Medium Risk (06/08/2021)  Tobacco Use: Medium Risk (02/04/2023)   Readmission Risk Interventions    01/25/2023    4:23 PM 08/12/2021   10:14 AM 08/12/2021   10:12 AM  Readmission Risk Prevention Plan  Transportation Screening Complete Complete Complete  PCP or Specialist Appt within 3-5 Days Complete    HRI or Home Care Consult Complete    Social Work Consult for Recovery Care Planning/Counseling Complete    Palliative Care Screening Not Applicable    Medication Review Oceanographer) Referral to Pharmacy Complete Complete  PCP or Specialist appointment within 3-5 days of discharge  Complete   HRI or Home  Care Consult  Complete   SW Recovery Care/Counseling Consult  Complete   Palliative Care Screening  Not Applicable   Skilled Nursing Facility  Not Applicable

## 2023-02-06 NOTE — Progress Notes (Signed)
   02/06/23 0130  BiPAP/CPAP/SIPAP  Reason BIPAP/CPAP not in use Non-compliant  BiPAP/CPAP /SiPAP Vitals  Pulse Rate (!) 58  SpO2 90 %  MEWS Score/Color  MEWS Score 1  MEWS Score Color Green   Refused cpap

## 2023-02-06 NOTE — Progress Notes (Signed)
Physical Therapy Treatment Patient Details Name: Lawrence [name] MRN: [id number] DOB: 05-26-1956 Today's Date: 02/06/2023   History of Present Illness Pt is a 67 y/o male who presents 01/22/23 with SOB and intermittent L sided CP for ~2 weeks PTA. R/L heart cath scheduled for 6/20 while inpatient. S/P heart cath 6/20. PMH significant for HTN, CHF, PAF on Eliquis, aortic stenosis, CAD, CVA, DM II, COPD, AAA.    PT Comments  Pt admitted with above diagnosis. Pt able to progress ambulation with RW with DOE 3/4. Pt requires 3LO2 at rest.  Desaturates to 87% on 3LO2 with activity but with incr to 4LO2 with activity, sats >88%. Will continue to follow pt acutely and progress pt as able. Pt currently with functional limitations due to the deficits listed below (see PT Problem List). Pt will benefit from acute skilled PT to increase their independence and safety with mobility to allow discharge.        Assistance Recommended at Discharge Intermittent Supervision/Assistance  If plan is discharge home, recommend the following:  Can travel by private vehicle    A little help with walking and/or transfers;A little help with bathing/dressing/bathroom;Assistance with cooking/housework;Assist for transportation;Help with stairs or ramp for entrance      Equipment Recommendations  None recommended by PT    Recommendations for Other Services       Precautions / Restrictions Precautions Precautions: Fall Precaution Comments: monitor HR and SATs Restrictions Weight Bearing Restrictions: No     Mobility  Bed Mobility               General bed mobility comments: pt seated in recliner    Transfers Overall transfer level: Needs assistance Equipment used: Rolling walker (2 wheels) Transfers: Sit to/from Stand Sit to Stand: Max assist           General transfer comment: Max A for power up from low recliner and anterior weight shift after pt with unsuccessful attempt.     Ambulation/Gait Ambulation/Gait assistance: Min guard Gait Distance (Feet): 40 Feet Assistive device: Rolling walker (2 wheels) Gait Pattern/deviations: Step-to pattern, Step-through pattern, Decreased stride length, Antalgic, Trunk flexed, Wide base of support, Knee hyperextension - right Gait velocity: Decreased Gait velocity interpretation: <1.31 ft/sec, indicative of household ambulator   General Gait Details: Heavy reliance on RW support. R knee appearing to hyperextend and pt reporting R knee pain due to arthritis. Cues for RW proximity due to pt pushing RW out too far at times.   Stairs             Wheelchair Mobility     Tilt Bed    Modified Rankin (Stroke Patients Only)       Balance Overall balance assessment: Needs assistance Sitting-balance support: No upper extremity supported, Feet supported Sitting balance-Leahy Scale: Good Sitting balance - Comments: sitting EOB   Standing balance support: Bilateral upper extremity supported, Reliant on assistive device for balance, During functional activity Standing balance-Leahy Scale: Poor Standing balance comment: reliant on RW support                            Cognition Arousal/Alertness: Awake/alert Behavior During Therapy: WFL for tasks assessed/performed, Flat affect Overall Cognitive Status: Within Functional Limits for tasks assessed Area of Impairment: Problem solving, Awareness                           Awareness: Intellectual Problem Solving: Slow  processing, Requires verbal cues, Requires tactile cues          Exercises General Exercises - Lower Extremity Ankle Circles/Pumps: AROM, Both, 10 reps, Supine Long Arc Quad: AROM, Both, 10 reps, Seated Hip Flexion/Marching: AROM, Both, 10 reps, Seated    General Comments General comments (skin integrity, edema, etc.): HR 85-94 bpm, O2 on arrival with pt on 3L was 93%.  With activity on 3L, pt desaturate to 87% and needed  4LO2 with activity to keep sats >88%.      Pertinent Vitals/Pain Pain Assessment Faces Pain Scale: Hurts little more Breathing: normal Negative Vocalization: none Facial Expression: smiling or inexpressive Body Language: relaxed Consolability: no need to console PAINAD Score: 0 Pain Location: R knee Pain Descriptors / Indicators: Aching, Discomfort Pain Intervention(s): Limited activity within patient's tolerance, Monitored during session, Repositioned    Home Living                          Prior Function            PT Goals (current goals can now be found in the care plan section) Acute Rehab PT Goals Patient Stated Goal: Back to baseline of function Progress towards PT goals: Progressing toward goals    Frequency    Min 1X/week      PT Plan Current plan remains appropriate    Co-evaluation              AM-PAC PT "6 Clicks" Mobility   Outcome Measure  Help needed turning from your back to your side while in a flat bed without using bedrails?: A Little Help needed moving from lying on your back to sitting on the side of a flat bed without using bedrails?: A Little Help needed moving to and from a bed to a chair (including a wheelchair)?: A Little Help needed standing up from a chair using your arms (e.g., wheelchair or bedside chair)?: A Little Help needed to walk in hospital room?: A Little Help needed climbing 3-5 steps with a railing? : Total 6 Click Score: 16    End of Session Equipment Utilized During Treatment: Gait belt;Oxygen Activity Tolerance: Patient limited by fatigue;Patient limited by pain Patient left: with call bell/phone within reach;in chair;with chair alarm set Nurse Communication: Mobility status PT Visit Diagnosis: Unsteadiness on feet (R26.81);Muscle weakness (generalized) (M62.81);Difficulty in walking, not elsewhere classified (R26.2)     Time: 1610-9604 PT Time Calculation (min) (ACUTE ONLY): 18 min  Charges:     $Gait Training: 8-22 mins PT General Charges $$ ACUTE PT VISIT: 1 Visit                     Annlee Glandon M,PT Acute Rehab Services [phone]    Bevelyn Buckles 02/06/2023, 2:18 PM

## 2023-02-07 ENCOUNTER — Inpatient Hospital Stay (HOSPITAL_COMMUNITY): Payer: Medicare HMO

## 2023-02-07 ENCOUNTER — Encounter (HOSPITAL_COMMUNITY)
Admission: EM | Disposition: A | Discharge status: Home / Self Care | Payer: Self-pay | Source: Home / Self Care | Attending: Internal Medicine

## 2023-02-07 ENCOUNTER — Encounter (HOSPITAL_COMMUNITY): Payer: Self-pay | Admitting: Internal Medicine

## 2023-02-07 ENCOUNTER — Inpatient Hospital Stay (HOSPITAL_COMMUNITY): Payer: Medicare HMO | Admitting: Certified Registered"

## 2023-02-07 DIAGNOSIS — I350 Nonrheumatic aortic (valve) stenosis: Secondary | ICD-10-CM

## 2023-02-07 DIAGNOSIS — Z952 Presence of prosthetic heart valve: Secondary | ICD-10-CM

## 2023-02-07 DIAGNOSIS — I4891 Unspecified atrial fibrillation: Secondary | ICD-10-CM

## 2023-02-07 DIAGNOSIS — Z006 Encounter for examination for normal comparison and control in clinical research program: Secondary | ICD-10-CM

## 2023-02-07 DIAGNOSIS — I5033 Acute on chronic diastolic (congestive) heart failure: Secondary | ICD-10-CM

## 2023-02-07 DIAGNOSIS — I110 Hypertensive heart disease with heart failure: Secondary | ICD-10-CM

## 2023-02-07 HISTORY — DX: Presence of prosthetic heart valve: Z95.2

## 2023-02-07 HISTORY — PX: TRANSCATHETER AORTIC VALVE REPLACEMENT, TRANSFEMORAL: SHX6400

## 2023-02-07 HISTORY — PX: INTRAOPERATIVE TRANSTHORACIC ECHOCARDIOGRAM: SHX6523

## 2023-02-07 LAB — COMPREHENSIVE METABOLIC PANEL
ALT: 18 U/L (ref 0–44)
AST: 13 U/L — ABNORMAL LOW (ref 15–41)
Albumin: 3 g/dL — ABNORMAL LOW (ref 3.5–5.0)
Alkaline Phosphatase: 52 U/L (ref 38–126)
Anion gap: 15 (ref 5–15)
BUN: 23 mg/dL (ref 8–23)
CO2: 26 mmol/L (ref 22–32)
Calcium: 8.8 mg/dL — ABNORMAL LOW (ref 8.9–10.3)
Chloride: 97 mmol/L — ABNORMAL LOW (ref 98–111)
Creatinine, Ser: 0.86 mg/dL (ref 0.61–1.24)
GFR, Estimated: >60 mL/min (ref >60)
Glucose, Bld: 153 mg/dL — ABNORMAL HIGH (ref 70–99)
Potassium: 3.8 mmol/L (ref 3.5–5.1)
Sodium: 138 mmol/L (ref 135–145)
Total Bilirubin: 0.8 mg/dL (ref 0.3–1.2)
Total Protein: 5.9 g/dL — ABNORMAL LOW (ref 6.5–8.1)

## 2023-02-07 LAB — TYPE AND SCREEN
ABO/RH(D): O POS
Antibody Screen: NEGATIVE

## 2023-02-07 LAB — CBC
HCT: 38.7 % — ABNORMAL LOW (ref 39.0–52.0)
Hemoglobin: 12.6 g/dL — ABNORMAL LOW (ref 13.0–17.0)
MCH: 29.6 pg (ref 26.0–34.0)
MCHC: 32.6 g/dL (ref 30.0–36.0)
MCV: 90.8 fL (ref 80.0–100.0)
Platelets: 229 10*3/uL (ref 150–400)
RBC: 4.26 MIL/uL (ref 4.22–5.81)
RDW: 15.2 % (ref 11.5–15.5)
WBC: 9.2 10*3/uL (ref 4.0–10.5)
nRBC: 0 % (ref 0.0–0.2)

## 2023-02-07 LAB — ECHOCARDIOGRAM LIMITED
AR max vel: 6.42 cm2
AV Area VTI: 6.3 cm2
AV Area mean vel: 6.59 cm2
AV Mean grad: 6.5 mmHg
AV Peak grad: 11.7 mmHg
Ao pk vel: 1.71 m/s
Height: 74 in
S' Lateral: 4.3 cm
Single Plane A2C EF: 57.9 %
Weight: 5760 oz

## 2023-02-07 LAB — GLUCOSE, CAPILLARY
Glucose-Capillary: 152 mg/dL — ABNORMAL HIGH (ref 70–99)
Glucose-Capillary: 141 mg/dL — ABNORMAL HIGH (ref 70–99)
Glucose-Capillary: 273 mg/dL — ABNORMAL HIGH (ref 70–99)

## 2023-02-07 LAB — POCT ACTIVATED CLOTTING TIME
Activated Clotting Time: 214 seconds
Activated Clotting Time: 226 seconds

## 2023-02-07 LAB — BLOOD GAS, ARTERIAL
Acid-Base Excess: 8.8 mmol/L — ABNORMAL HIGH (ref 0.0–2.0)
Bicarbonate: 34.1 mmol/L — ABNORMAL HIGH (ref 20.0–28.0)
Drawn by: 252031
O2 Saturation: 91.4 %
Patient temperature: 37
pCO2 arterial: 48 mmHg (ref 32–48)
pH, Arterial: 7.46 — ABNORMAL HIGH (ref 7.35–7.45)
pO2, Arterial: 60 mmHg — ABNORMAL LOW (ref 83–108)

## 2023-02-07 LAB — HEPARIN LEVEL (UNFRACTIONATED): Heparin Unfractionated: 0.44 IU/mL (ref 0.30–0.70)

## 2023-02-07 LAB — MAGNESIUM: Magnesium: 2 mg/dL (ref 1.7–2.4)

## 2023-02-07 LAB — HEMOGLOBIN A1C
Hgb A1c MFr Bld: 6.4 % — ABNORMAL HIGH (ref 4.8–5.6)
Mean Plasma Glucose: 136.98 mg/dL

## 2023-02-07 SURGERY — IMPLANTATION, AORTIC VALVE, TRANSCATHETER, FEMORAL APPROACH
Anesthesia: Monitor Anesthesia Care | Laterality: Right

## 2023-02-07 MED ORDER — APIXABAN 5 MG PO TABS
5.0000 mg | ORAL_TABLET | Freq: Two times a day (BID) | ORAL | Status: DC
  Administered 2023-02-07 – 2023-02-08 (×2): 5 mg via ORAL
  Filled 2023-02-07 (×2): qty 1

## 2023-02-07 MED ORDER — HEPARIN SODIUM (PORCINE) 1000 UNIT/ML IJ SOLN
INTRAMUSCULAR | Status: DC | PRN
  Administered 2023-02-07: 3000 [IU] via INTRAVENOUS
  Administered 2023-02-07: 11000 [IU] via INTRAVENOUS

## 2023-02-07 MED ORDER — LACTATED RINGERS IV SOLN: INTRAVENOUS | Status: DC

## 2023-02-07 MED ORDER — SODIUM CHLORIDE 0.9 % IV SOLN: INTRAVENOUS | Status: AC

## 2023-02-07 MED ORDER — CEFAZOLIN SODIUM-DEXTROSE 2-4 GM/100ML-% IV SOLN
2.0000 g | Freq: Three times a day (TID) | INTRAVENOUS | Status: AC
  Administered 2023-02-07 – 2023-02-08 (×2): 2 g via INTRAVENOUS
  Filled 2023-02-07 (×2): qty 100

## 2023-02-07 MED ORDER — SODIUM CHLORIDE 0.9 % IV SOLN: INTRAVENOUS | Status: DC | PRN

## 2023-02-07 MED ORDER — PROTAMINE SULFATE 10 MG/ML IV SOLN
INTRAVENOUS | Status: DC | PRN
  Administered 2023-02-07: 280 mg via INTRAVENOUS

## 2023-02-07 MED ORDER — FENTANYL CITRATE (PF) 100 MCG/2ML IJ SOLN
INTRAMUSCULAR | Status: DC | PRN
  Administered 2023-02-07 (×2): 25 ug via INTRAVENOUS
  Administered 2023-02-07: 50 ug via INTRAVENOUS

## 2023-02-07 MED ORDER — MIDAZOLAM HCL 2 MG/2ML IJ SOLN
INTRAMUSCULAR | Status: DC | PRN
  Administered 2023-02-07: 2 mg via INTRAVENOUS

## 2023-02-07 MED ORDER — ACETAMINOPHEN 650 MG RE SUPP: 650.0000 mg | Freq: Four times a day (QID) | RECTAL | Status: DC | PRN

## 2023-02-07 MED ORDER — IOHEXOL 350 MG/ML SOLN
INTRAVENOUS | Status: DC | PRN
  Administered 2023-02-07: 110 mL

## 2023-02-07 MED ORDER — HEPARIN (PORCINE) IN NACL 1000-0.9 UT/500ML-% IV SOLN
INTRAVENOUS | Status: DC | PRN
  Administered 2023-02-07 (×3): 500 mL

## 2023-02-07 MED ORDER — PHENYLEPHRINE 80 MCG/ML (10ML) SYRINGE FOR IV PUSH (FOR BLOOD PRESSURE SUPPORT)
PREFILLED_SYRINGE | INTRAVENOUS | Status: DC | PRN
  Administered 2023-02-07: 200 ug via INTRAVENOUS

## 2023-02-07 MED ORDER — VERAPAMIL HCL 2.5 MG/ML IV SOLN
INTRAVENOUS | Status: DC | PRN
  Administered 2023-02-07: 10 mL via INTRA_ARTERIAL

## 2023-02-07 MED ORDER — ACETAMINOPHEN 325 MG PO TABS: 650.0000 mg | ORAL_TABLET | Freq: Four times a day (QID) | ORAL | Status: DC | PRN

## 2023-02-07 MED ORDER — LIDOCAINE HCL (PF) 1 % IJ SOLN
INTRAMUSCULAR | Status: DC | PRN
  Administered 2023-02-07: 2 mL
  Administered 2023-02-07: 10 mL
  Administered 2023-02-07: 2 mL

## 2023-02-07 MED ORDER — CHLORHEXIDINE GLUCONATE 4 % EX SOLN
4.0000 | Freq: Once | CUTANEOUS | Status: DC
  Filled 2023-02-07: qty 60

## 2023-02-07 MED ORDER — NITROGLYCERIN IN D5W 200-5 MCG/ML-% IV SOLN: 0.0000 ug/min | INTRAVENOUS | Status: DC

## 2023-02-07 MED ORDER — SODIUM CHLORIDE 0.9 % IV SOLN: 250.0000 mL | INTRAVENOUS | Status: DC

## 2023-02-07 MED ORDER — CLEVIDIPINE BUTYRATE 0.5 MG/ML IV EMUL
INTRAVENOUS | Status: DC | PRN
  Administered 2023-02-07: 4 mg/h via INTRAVENOUS

## 2023-02-07 MED ORDER — SODIUM CHLORIDE 0.9% FLUSH: 3.0000 mL | INTRAVENOUS | Status: DC | PRN

## 2023-02-07 MED ORDER — SODIUM CHLORIDE 0.9 % IV SOLN: 250.0000 mL | INTRAVENOUS | Status: DC | PRN

## 2023-02-07 MED ORDER — SODIUM CHLORIDE 0.9% FLUSH
3.0000 mL | Freq: Two times a day (BID) | INTRAVENOUS | Status: DC
  Administered 2023-02-07 – 2023-02-08 (×2): 3 mL via INTRAVENOUS

## 2023-02-07 SURGICAL SUPPLY — 36 items
BAG SNAP BAND KOVER 36X36 (MISCELLANEOUS) ×4 IMPLANT
BALLN TRUE 26X4.5 (BALLOONS) ×2
BALLOON TRUE 26X4.5 (BALLOONS) IMPLANT
CABLE ADAPT PACING TEMP 12FT (ADAPTER) IMPLANT
CATH DIAG 6FR PIGTAIL ANGLED (CATHETERS) IMPLANT
CATH INFINITI 5FR ANG PIGTAIL (CATHETERS) IMPLANT
CATH INFINITI 6F AL2 (CATHETERS) IMPLANT
CATH S G BIP PACING (CATHETERS) IMPLANT
CLOSURE PERCLOSE PROSTYLE (VASCULAR PRODUCTS) IMPLANT
DEVICE RAD COMP TR BAND LRG (VASCULAR PRODUCTS) IMPLANT
DILATOR VESSEL 38 20CM 14FR (INTRODUCER) IMPLANT
ELECT DEFIB PAD ADLT CADENCE (PAD) IMPLANT
GLIDESHEATH SLEND SS 6F .021 (SHEATH) IMPLANT
GUIDEWIRE STRAIGHT .035 260CM (WIRE) IMPLANT
KIT HEART LEFT (KITS) ×2 IMPLANT
PACK CARDIAC CATHETERIZATION (CUSTOM PROCEDURE TRAY) ×2 IMPLANT
SHEATH DRYSEAL FLEX 22FR 33CM (SHEATH) IMPLANT
SHEATH PINNACLE 6F 10CM (SHEATH) IMPLANT
SHEATH PINNACLE 8F 10CM (SHEATH) IMPLANT
SHEATH PROBE COVER 6X72 (BAG) IMPLANT
SLEEVE REPOSITIONING LENGTH 30 (MISCELLANEOUS) IMPLANT
STOPCOCK MORSE 400PSI 3WAY (MISCELLANEOUS) ×4 IMPLANT
SYS EVOLUT FX DELIVERY 34 (CATHETERS) ×2
SYS EVOLUT FX LOADING 34 (CATHETERS) ×2
SYSTEM EVOLUT FX DELIVERY 34 (CATHETERS) IMPLANT
SYSTEM EVOLUT FX LOADING 34 (CATHETERS) IMPLANT
TRANSDUCER W/STOPCOCK (MISCELLANEOUS) ×4 IMPLANT
VALVE EVOLUT FX 34 (Valve) IMPLANT
WIRE AMPLATZ SS-J .035X260CM (WIRE) IMPLANT
WIRE EMERALD 3MM-J .035X150CM (WIRE) IMPLANT
WIRE EMERALD 3MM-J .035X260CM (WIRE) IMPLANT
WIRE EMERALD ST .035X260CM (WIRE) ×2 IMPLANT
WIRE MICRO SET 5FR 12 (WIRE) IMPLANT
WIRE MICRO SET SILHO 5FR 7 (SHEATH) IMPLANT
WIRE PACING TEMP ST TIP 5 (CATHETERS) IMPLANT
WIRE SAFARI SM CURVE 275 (WIRE) IMPLANT

## 2023-02-07 NOTE — Progress Notes (Addendum)
Advanced Heart Failure Rounding Note  PCP-Cardiologist: Armanda Magic, MD   Subjective:    Going for TAVR later this am.  Continues to diurese well with PO torsemide.  Feeling better each day. No dyspnea at rest. No recurrent chest pain.    Objective:   Weight Range: (!) 165.2 kg Body mass index is 46.76 kg/m.   Vital Signs:   Temp:  [97.7 F (36.5 C)-98.2 F (36.8 C)] 98.2 F (36.8 C) (07/02 0629) Pulse Rate:  [71-74] 74 (07/02 0802) Resp:  [16-18] 18 (07/02 0802) BP: (106-109)/(64-74) 106/74 (07/02 0629) SpO2:  [88 %-96 %] 92 % (07/02 0802) Weight:  [165.2 kg] 165.2 kg (07/02 0629) Last BM Date : 02/06/23  Weight change: Filed Weights   02/05/23 0544 02/06/23 0344 02/07/23 0629  Weight: (!) 163.1 kg (!) 165.6 kg (!) 165.2 kg    Intake/Output:   Intake/Output Summary (Last 24 hours) at 02/07/2023 0853 Last data filed at 02/06/2023 2300 Gross per 24 hour  Intake 759.17 ml  Output 2850 ml  Net -2090.83 ml      Physical Exam    General:  Lying comfortably in bed. HEENT: normal Neck: supple. JVP difficult d/t thick neck. Carotids 2+ bilat; no bruits. No lymphadenopathy or thryomegaly appreciated. Cor: PMI nondisplaced. Regular rate & rhythm. No rubs, gallops, 3/6 AS murmur with reduced S2. Lungs: clear Abdomen: obese, soft, nontender, nondistended.  Extremities: no cyanosis, clubbing, rash, trace edema, chronic venous stasis changes Neuro: alert & orientedx3. Affect pleasant    Telemetry   SR 70s, < 5 PVCs/min  Labs    CBC Recent Labs    02/06/23 0818 02/07/23 0648  WBC 9.3 9.2  HGB 12.8* 12.6*  HCT 39.6 38.7*  MCV 91.0 90.8  PLT 213 229   Basic Metabolic Panel Recent Labs    40/98/11 0818 02/07/23 0648  NA 134* 138  K 3.3* 3.8  CL 98 97*  CO2 26 26  GLUCOSE 199* 153*  BUN 21 23  CREATININE 0.83 0.86  CALCIUM 8.6* 8.8*  MG 1.8 2.0   Liver Function Tests Recent Labs    02/07/23 0648  AST 13*  ALT 18  ALKPHOS 52  BILITOT  0.8  PROT 5.9*  ALBUMIN 3.0*   No results for input(s): "LIPASE", "AMYLASE" in the last 72 hours. Cardiac Enzymes No results for input(s): "CKTOTAL", "CKMB", "CKMBINDEX", "TROPONINI" in the last 72 hours.  BNP: BNP (last 3 results) Recent Labs    12/05/22 1514 01/12/23 1405 01/22/23 2054  BNP 19.3 19.5 25.3    ProBNP (last 3 results) No results for input(s): "PROBNP" in the last 8760 hours.   D-Dimer No results for input(s): "DDIMER" in the last 72 hours. Hemoglobin A1C Recent Labs    02/07/23 0648  HGBA1C 6.4*   Fasting Lipid Panel No results for input(s): "CHOL", "HDL", "LDLCALC", "TRIG", "CHOLHDL", "LDLDIRECT" in the last 72 hours. Thyroid Function Tests No results for input(s): "TSH", "T4TOTAL", "T3FREE", "THYROIDAB" in the last 72 hours.  Invalid input(s): "FREET3"  Other results:   Imaging    DG Chest 2 View  Result Date: 02/06/2023 CLINICAL DATA:  Preoperative evaluation, initial encounter EXAM: CHEST - 2 VIEW COMPARISON:  01/22/2023 FINDINGS: Cardiac shadow is enlarged but stable. Lungs are well aerated bilaterally. No focal infiltrate or effusion is seen. No bony abnormality is noted. CardioMEMS device is noted. IMPRESSION: No acute abnormality noted. Electronically Signed   By: Alcide Clever M.D.   On: 02/06/2023 22:04  Medications:     Scheduled Medications:  allopurinol  200 mg Oral BID   ARIPiprazole  5 mg Oral QHS   atorvastatin  40 mg Oral Daily   busPIRone  10 mg Oral TID   chlorhexidine  4 Application Topical Once   chlorhexidine  15 mL Mouth/Throat Once   diclofenac Sodium  4 g Topical QID   fenofibrate  160 mg Oral Daily   fluticasone furoate-vilanterol  1 puff Inhalation Daily   And   umeclidinium bromide  1 puff Inhalation Daily   insulin aspart  0-15 Units Subcutaneous TID WC   insulin aspart  0-5 Units Subcutaneous QHS   insulin aspart  3 Units Subcutaneous TID WC   insulin glargine-yfgn  30 Units Subcutaneous Daily    lacosamide  150 mg Oral BID   levETIRAcetam  500 mg Oral BID   lidocaine  1 patch Transdermal Q24H   magnesium sulfate  40 mEq Other To OR   metoprolol succinate  50 mg Oral Daily   mirtazapine  15 mg Oral QHS   mupirocin ointment  1 Application Nasal BID   pantoprazole  40 mg Oral BID   potassium chloride  80 mEq Other To OR   potassium chloride  60 mEq Oral Daily   prazosin  2 mg Oral QHS   pregabalin  150 mg Oral TID   spironolactone  12.5 mg Oral Daily   traZODone  200 mg Oral QHS   venlafaxine XR  225 mg Oral Q breakfast    Infusions:   ceFAZolin (ANCEF) IV     dexmedetomidine     heparin 30,000 units/NS 1000 mL solution for CELLSAVER     heparin 2,000 Units/hr (02/07/23 0428)   norepinephrine (LEVOPHED) Adult infusion      PRN Medications: acetaminophen **OR** acetaminophen, guaiFENesin, hydrALAZINE, ipratropium-albuterol, melatonin, morphine injection, ondansetron (ZOFRAN) IV, oxyCODONE, senna-docusate, temazepam    Patient Profile  Lawrence [name] is a 67 y.o. male with history of chronic diastolic CHF, PAF, OSA, morbid obesity, DM II, recent diagnosis severe AS.   Admitted with acute hypoxic respiratory failure secondary to a/c diastolic CHF.  Planning for TAVR.  Assessment/Plan  1. Acute on chronic diastolic CHF: Echo (11/22) with EF 60-65%, mild LVH, mildly dilated LV, moderate aortic stenosis (trileaflet aortic valve) with mean gradient 22 mmHg, DI 0.27.  Echo in 10/23 with EF 60-65%, mild LV dilation, normal RV, moderate AS with mean gradient 28 and AVA around 1 cm^2. Echo 12/22/22: EF 60-65%, RV okay, severe AS with mean gradient of 40 mmHg and AVA 1.03 cm2.  - RHC 01/26/23: mRA 9, PA 34/15(24), mPCWP 14, Fick CO/CI 9.7/3.5 - Admitted with NYHA IIIb/IV symptoms. He has been diuresed. Volume difficult on exam but does not look significantly overloaded. Hold diuretics today prior to TAVR. - He's had brisk diuresis with 20 mg Torsemide daily but has been on 100 mg BID  at home (confirmed with patient again this am). Will reassess diuretic needs post procedure. Hendricks Limes held for pending TAVR - Continue spironolactone 12.5 mg daily. - On losartan at home, but not added back on admit. Currently no BP room.  2. Aortic stenosis: Moderate AS on TEE in 1/23 and echo in 10/23.  - Echo 05/24: EF 60-65%, severe AS with mean gradient of 40 mmHg and AVA 1.03 cm2. - Nonobstructive CAD on LHC 06/24 - Had multiple dentral extractions on 06/28 - Plan for TAVR on 07/02 3. Acute respiratory failure with hypoxia - 2/2 acute  on chronic diastolic CHF +/- ? Component of OHS - Currently on 4L O2 [place], wean as tolerated 4. Atrial fibrillation: Paroxysmal. NSR today.  - On heparin in anticipation of TAVR. Switch back to Eliquis post procedure. - Continue Toprol XL 5. OSA/?OHS: Using Bipap nightly.  6. Obesity: Body mass index is 46. - Continue tirzepatide as outpatient, he has not lost weight yet with this.  7. Hyperlipidemia: On 40 mg atorvastatin and vascepa.  - LDL 60 and triglycerides 239 in 04/24. - Continue current management. 8. Seizures  - On Keppra per Neurology  9. DM II: On insulin - A1c 6.4% 06/24 - Resume Farxiga post TAVR - Insulin per primary team 10. HTN: BP soft - Stopped diltiazem, keep off. Continue Toprol XL and spiro - Losartan not restarted on admit.   Length of Stay: 15  FINCH, LINDSAY N, PA-C  02/07/2023, 8:53 AM  Advanced Heart Failure Team Pager [phone] (M-F; 7a - 5p)  Please contact [facility] for night-coverage after hours (5p -7a ) and weekends on [email or web address]  Patient seen with PA, agree with the above note.   Patient to go for TAVR today.  Stable breathing.   General: NAD Neck: JVP difficult, no thyromegaly or thyroid nodule.  Lungs: Clear to auscultation bilaterally with normal respiratory effort. CV: Nondisplaced PMI.  Heart regular S1/S2, no S3/S4, 2/6 SEM RUSB.  1+ ankle edema.  Abdomen: Soft, nontender, no  hepatosplenomegaly, no distention.  Skin: Intact without lesions or rashes.  Neurologic: Alert and oriented x 3.  Psych: Normal affect. Extremities: No clubbing or cyanosis.  HEENT: Normal.   For TAVR today. Will hold torsemide today, restart torsemide and Farxiga after procedure. Will need to determie torsemide dosing.  Creatinine stable.    Marca Ancona 02/07/2023 12:50 PM

## 2023-02-07 NOTE — Progress Notes (Signed)
  [facility]   MULTIDISCIPLINARY HEART VALVE TEAM  Patient doing well s/p TAVR. He is hemodynamically stable. Groin and radial sites remain stable. Post TAVR ECG pending however telemetry with no evidence of high grade block. Arterial line discontinued in the cath lab and patient awaiting transfer to 4E. Plan for early ambulation after bedrest completed and hopeful discharge over the next 24-48 hours once medically ready.   Georgie Chard NP-C Structural Heart Team  Pager: [phone] Phone: [phone]

## 2023-02-07 NOTE — Anesthesia Preprocedure Evaluation (Addendum)
Anesthesia Evaluation  Patient identified by MRN, date of birth, ID band Patient awake    Reviewed: Allergy & Precautions, NPO status , Patient's Chart, lab work & pertinent test results  Airway Mallampati: III  TM Distance: >3 FB Neck ROM: Full    Dental  (+) Poor Dentition   Pulmonary asthma , sleep apnea and Continuous Positive Airway Pressure Ventilation , COPD, former smoker   Pulmonary exam normal        Cardiovascular hypertension, Pt. on medications and Pt. on home beta blockers + CAD and +CHF   Rhythm:Regular Rate:Normal + Systolic murmurs ECHO 2024:  1. Left ventricular ejection fraction, by estimation, is 60 to 65%. The  left ventricle has normal function. The left ventricle has no regional  wall motion abnormalities. There is mild left ventricular hypertrophy.  Left ventricular diastolic parameters  were normal.   2. Right ventricular systolic function is normal. The right ventricular  size is normal.   3. Left atrial size was moderately dilated.   4. The mitral valve is abnormal. No evidence of mitral valve  regurgitation. No evidence of mitral stenosis.   5. AS has progressed since TTE done 05/2022. The aortic valve was not  well visualized. There is moderate calcification of the aortic valve.  There is moderate thickening of the aortic valve. Aortic valve  regurgitation is not visualized. Severe aortic  valve stenosis.   6. Aortic dilatation noted. There is mild dilatation of the aortic root,  measuring 38 mm. There is mild dilatation of the ascending aorta,  measuring 40 mm.   7. The inferior vena cava is normal in size with greater than 50%  respiratory variability, suggesting right atrial pressure of 3 mmHg.     Neuro/Psych Seizures -,   Anxiety Depression    CVA    GI/Hepatic Neg liver ROS,GERD  Medicated,,  Endo/Other  diabetes, Type 2, Oral Hypoglycemic Agents  Morbid obesity  Renal/GU negative  Renal ROS     Musculoskeletal  (+) Arthritis , Osteoarthritis,    Abdominal Normal abdominal exam  (+)   Peds  Hematology  (+) Blood dyscrasia, anemia   Anesthesia Other Findings   Reproductive/Obstetrics                             Anesthesia Physical Anesthesia Plan  ASA: 4  Anesthesia Plan: MAC   Post-op Pain Management:    Induction: Intravenous  PONV Risk Score and Plan: 1 and Propofol infusion and Treatment may vary due to age or medical condition  Airway Management Planned: Simple Face Mask  Additional Equipment: Arterial line  Intra-op Plan:   Post-operative Plan:   Informed Consent: I have reviewed the patients History and Physical, chart, labs and discussed the procedure including the risks, benefits and alternatives for the proposed anesthesia with the patient or authorized representative who has indicated his/her understanding and acceptance.     Dental advisory given  Plan Discussed with: CRNA  Anesthesia Plan Comments:        Anesthesia Quick Evaluation

## 2023-02-07 NOTE — Transfer of Care (Signed)
Immediate Anesthesia Transfer of Care Note  Patient: Lawrence [name]  Procedure(s) Performed: Transcatheter Aortic Valve Replacement, Transfemoral (Right) INTRAOPERATIVE TRANSTHORACIC ECHOCARDIOGRAM  Patient Location: Cath Lab  Anesthesia Type:MAC  Level of Consciousness: drowsy and patient cooperative  Airway & Oxygen Therapy: Patient Spontanous Breathing and Patient connected to nasal cannula oxygen  Post-op Assessment: Report given to RN and Post -op Vital signs reviewed and stable  Post vital signs: Reviewed and stable  Last Vitals:  Vitals Value Taken Time  BP 97/84 02/07/23 1538  Temp    Pulse 61 02/07/23 1539  Resp 13 02/07/23 1539  SpO2 90 % 02/07/23 1539  Vitals shown include unvalidated device data.  Last Pain:  Vitals:   02/07/23 1308  TempSrc:   PainSc: Asleep      Patients Stated Pain Goal: 2 (02/07/23 1610)  Complications: There were no known notable events for this encounter.

## 2023-02-07 NOTE — Progress Notes (Signed)
  Echocardiogram 2D Echocardiogram has been performed.  Lawrence [name] 02/07/2023, 2:54 PM

## 2023-02-07 NOTE — Anesthesia Postprocedure Evaluation (Signed)
Anesthesia Post Note  Patient: Lawrence [name]  Procedure(s) Performed: Transcatheter Aortic Valve Replacement, Transfemoral (Right) INTRAOPERATIVE TRANSTHORACIC ECHOCARDIOGRAM     Patient location during evaluation: Cath Lab Anesthesia Type: MAC Level of consciousness: awake and alert Pain management: pain level controlled Vital Signs Assessment: post-procedure vital signs reviewed and stable Respiratory status: spontaneous breathing and respiratory function stable Cardiovascular status: stable Postop Assessment: no apparent nausea or vomiting Anesthetic complications: no  There were no known notable events for this encounter.  Last Vitals:  Vitals:   02/07/23 1635 02/07/23 1640  BP: 109/78 105/70  Pulse: 67 69  Resp: 14 16  Temp:    SpO2: 94% 93%    Last Pain:  Vitals:   02/07/23 1615  TempSrc: Temporal  PainSc: 0-No pain                 [name]

## 2023-02-07 NOTE — Interval H&P Note (Signed)
History and Physical Interval Note:  02/07/2023 11:34 AM  Lawrence [name]  has presented today for surgery, with the diagnosis of Severe Aortic Stenosis.  The various methods of treatment have been discussed with the patient and family. After consideration of risks, benefits and other options for treatment, the patient has consented to  Procedure(s): Transcatheter Aortic Valve Replacement, Transfemoral (Right) INTRAOPERATIVE TRANSTHORACIC ECHOCARDIOGRAM (N/A) as a surgical intervention.  The patient's history has been reviewed, patient examined, no change in status, stable for surgery.  I have reviewed the patient's chart and labs.  Questions were answered to the patient's satisfaction.     Alleen Borne

## 2023-02-07 NOTE — Op Note (Signed)
[facility]   MULTIDISCIPLINARY HEART VALVE TEAM   TAVR OPERATIVE NOTE   Date of Procedure:  02/07/2023   Preoperative Diagnosis: Severe Aortic Stenosis   Postoperative Diagnosis: Same   Procedure:   Transcatheter Aortic Valve Replacement - Percutaneous Transfemoral Approach  Medtronic Evolut FX (size 34 mm, serial # [id number] )   Co-Surgeons:  Alleen Borne, MD and Alverda Skeans, MD  Anesthesiologist:  Heather Roberts, MD  Echocardiographer:  Riley Lam, MD  Pre-operative Echo Findings: Severe aortic stenosis Normal left ventricular systolic function  Post-operative Echo Findings: No paravalvular leak Unchanged left ventricular systolic function  Left Heart Catheterization LVEDP   BRIEF CLINICAL NOTE AND INDICATIONS FOR SURGERY  The patient is a 67 year old male with a history of chronic diastolic dysfunction, paroxysmal atrial fibrillation on Eliquis, BMI 46, COPD, obstructive sleep apnea, and severe aortic stenosis who was admitted with acute on chronic diastolic heart failure.  He was medically stabilized.  Multidisciplinary consensus evaluation suggest that he would be best served by transcatheter aortic valve replacement given his multiple comorbidities.  He is referred for elective transcatheter aortic valve replacement with a 34 mm Evolut FX bioprosthesis from the right transfemoral approach.  During the course of the patient's preoperative work up they have been evaluated comprehensively by a multidisciplinary team of specialists coordinated through the Multidisciplinary Heart Valve Clinic in the [facility].  They have been demonstrated to suffer from symptomatic severe aortic stenosis as noted above. The patient has been counseled extensively as to the relative risks and benefits of all options for the treatment of severe aortic stenosis including long term medical therapy, conventional surgery for aortic valve  replacement, and transcatheter aortic valve replacement.  The patient has been independently evaluated in formal cardiac surgical consultation by Dr Laneta Simmers, who deemed the patient appropriate for TAVR. Based upon review of all of the patient's preoperative diagnostic tests they are felt to be candidate for transcatheter aortic valve replacement using the transfemoral approach as an alternative to conventional surgery.    Following the decision to proceed with transcatheter aortic valve replacement, a discussion has been held regarding what types of management strategies would be attempted intraoperatively in the event of life-threatening complications, including whether or not the patient would be considered a candidate for the use of cardiopulmonary bypass and/or conversion to open sternotomy for attempted surgical intervention.  The patient has been advised of a variety of complications that might develop peculiar to this approach including but not limited to risks of death, stroke, paravalvular leak, aortic dissection or other major vascular complications, aortic annulus rupture, device embolization, cardiac rupture or perforation, acute myocardial infarction, arrhythmia, heart block or bradycardia requiring permanent pacemaker placement, congestive heart failure, respiratory failure, renal failure, pneumonia, infection, other late complications related to structural valve deterioration or migration, or other complications that might ultimately cause a temporary or permanent loss of functional independence or other long term morbidity.  The patient provides full informed consent for the procedure as described and all questions were answered preoperatively.  DETAILS OF THE OPERATIVE PROCEDURE  PREPARATION:   The patient is brought to the operating room on the above mentioned date and central monitoring was established by the anesthesia team including placement of a radial arterial line. The patient is  placed in the supine position on the operating table.  Intravenous antibiotics are administered. The patient is monitored closely throughout the procedure under conscious sedation.      Baseline  transthoracic echocardiogram is performed. The patient's chest, abdomen, both groins, and both lower extremities are prepared and draped in a sterile manner. A time out procedure is performed.   PERIPHERAL ACCESS:   Using ultrasound guidance, left radial arterial and right internal jugular venous access is obtained with placement of 6 Fr sheaths.  [place] images are captured and digitally stored in the patient's record. A pigtail diagnostic catheter was passed through the left radial arterial sheath under fluoroscopic guidance into the aortic root (non-coronary cusp).  A temporary transvenous pacemaker catheter was passed through the jugular venous sheath under fluoroscopic guidance into the right ventricle.  The pacemaker was tested to ensure stable lead placement and pacemaker capture.   TRANSFEMORAL ACCESS:  A micropuncture technique is used to access the right femoral artery under fluoroscopic and ultrasound guidance.  [place] images are captured and digitally stored in the patient's chart. 2 Perclose devices are deployed at 10' and 2' positions to 'PreClose' the femoral artery. An 8 French sheath is placed and then an Amplatz Superstiff wire is advanced through the sheath. This is changed out for an 22 Fr Dry-Seal sheath after progressively dilating over the Superstiff wire.  An AL-1 catheter was used to direct a straight-tip exchange length wire across the native aortic valve into the left ventricle. This was exchanged out for a pigtail catheter and position was confirmed in the LV apex.  Simultaneous LV and Ao pressures were recorded.  The pigtail catheter was exchanged for a Safari wire in the LV apex.    BALLOON AORTIC VALVULOPLASTY:  Balloon aortic valvuloplasty with rapid pacing was performed with a 26 mm true  balloon.  TRANSCATHETER HEART VALVE DEPLOYMENT:  A Medtronic Evolut FX transcatheter heart valve (size 34 mm) was prepared and crimped per manufacturer's guidelines, and the proper orientation of the valve is confirmed on the Medtronic delivery system. The valve was advanced through the introducer sheath and across the aortic arch over the Confida wire until it is positioned at the base of the pigtail catheter in the aortic valve annulus. Using the fine tuning wheel, valve deployment begins until annular contact is made. Controlled ventricular pacing is performed. Once proper position is confirmed via aortic root angiograms in the cusp overlap and LAO projections, the valve is deployed to 80% where it is fully functional. The patient's hemodynamic recovery following valve deployment is good.  Final position is confirmed and the valve is slowly deployed over 30 seconds until both paddles are released.   BALLOON AORTIC VALVULOPLASTY:  Echocardiographic assessment demonstrated mild paravalvular leak.  We elected to pursue balloon aortic valvuloplasty for valve bioprosthesis expansion.  Post dilation with a 26 mm true balloon was performed.  Repeat echocardiographic assessment demonstrated no paravalvular leak and unchanged ventricular function and electrical conduction.  PROCEDURE COMPLETION:  The sheath was removed and femoral artery closure is performed using the 2 previously deployed Perclose devices.  Protamine is administered once femoral arterial repair was complete. The site is clear with no evidence of bleeding or hematoma after the sutures are tightened. The temporary pacemaker and pigtail catheters are removed.  A pigtail catheter was maneuvered into the abdominal aorta.  Aortography demonstrated a very mild amount of extravasation at the right common femoral artery.  A TR band was placed on the left radial access site, the temporary pacemaker was removed, and manual pressure applied to the right  internal jugular venous access site.  The patient tolerated the procedure well and is transported to the recovery  area in stable condition. There were no immediate intraoperative complications. All sponge instrument and needle counts are verified correct at completion of the operation.   The patient received a total of 110 mL of intravenous contrast during the procedure.   Alverda Skeans, MD @td  @now

## 2023-02-07 NOTE — Progress Notes (Signed)
ANTICOAGULATION CONSULT NOTE  Pharmacy Consult for heparin Indication: atrial fibrillation  Allergies  Allergen Reactions   Vancomycin Other (See Comments)    "Red Man Syndrome" 02/02/17: possible cause for rash under both arms   Niacin And Related Other (See Comments)    Red man syndrome   Tubersol [Tuberculin, Ppd] Other (See Comments)    Reaction unknown   Doxycycline Rash and Other (See Comments)    Patient Measurements: Height: 6\' 2"  (188 cm) Weight: (!) 165.2 kg (364 lb 3.2 oz) IBW/kg (Calculated) : 82.2 Heparin Dosing Weight: 120 kg  Vital Signs: Temp: 98.2 F (36.8 C) (07/02 0629) Temp Source: Oral (07/02 0629) BP: 106/74 (07/02 0629) Pulse Rate: 74 (07/02 0802)  Labs: Recent Labs    02/05/23 0250 02/05/23 1147 02/05/23 1147 02/05/23 2033 02/06/23 0818 02/07/23 0648  HGB 14.3  --   --   --  12.8* 12.6*  HCT 44.1  --   --   --  39.6 38.7*  PLT 224  --   --   --  213 229  APTT  --  21*  --   --   --   --   LABPROT  --  13.4  --   --   --   --   INR  --  1.0  --   --   --   --   HEPARINUNFRC  --  <0.10*   < > 0.27* 0.42 0.44  CREATININE 1.03  --   --   --  0.83 0.86   < > = values in this interval not displayed.     Estimated Creatinine Clearance: 139.8 mL/min (by C-G formula based on SCr of 0.86 mg/dL).   Medical History: Past Medical History:  Diagnosis Date   Acquired dilation of ascending aorta and aortic root (HCC)    40mm by echo 01/2021   Adenomatous colon polyp 2007   Anemia    Anxiety    Asthma    BPH without obstruction/lower urinary tract symptoms 02/22/2017   Chronic diastolic (congestive) heart failure (HCC)    Chronic venous stasis 03/07/2019   COPD (chronic obstructive pulmonary disease) (HCC)    Coronary artery calcification seen on CAT scan    Depression    Diabetic neuropathy (HCC) 09/11/2019   History of colon polyps 08/24/2018   Hypertension    Morbid obesity (HCC)    OSA (obstructive sleep apnea)    Pain due to  onychomycosis of toenails of both feet 09/11/2019   Peripheral neuropathy 02/22/2017   Primary osteoarthritis, left shoulder 03/05/2017   PTSD (post-traumatic stress disorder)    Pure hypercholesterolemia    QT prolongation 03/07/2019   Seizures (HCC)    Severe aortic stenosis    Sinus tachycardia 03/07/2019   Sleep apnea    CPAP   Type 2 diabetes mellitus with vascular disease (HCC) 09/11/2019    Medications:  Medications Prior to Admission  Medication Sig Dispense Refill Last Dose   acetaminophen (TYLENOL) 650 MG CR tablet Take 1,300 mg by mouth every 8 (eight) hours as needed for pain.   01/22/2023   albuterol (VENTOLIN HFA) 108 (90 Base) MCG/ACT inhaler Inhale 2 puffs into the lungs every 6 (six) hours as needed for wheezing or shortness of breath. 1 each 6 Past Month   allopurinol (ZYLOPRIM) 100 MG tablet Take 200 mg by mouth 2 (two) times daily.   01/22/2023   ARIPiprazole (ABILIFY) 5 MG tablet TAKE ONE TABLET BY MOUTH EVERYDAY AT BEDTIME (  Patient taking differently: Take 5 mg by mouth at bedtime.) 90 tablet 3 Past Week   atorvastatin (LIPITOR) 40 MG tablet Take 1 tablet (40 mg total) by mouth daily. 90 tablet 3 01/22/2023   busPIRone (BUSPAR) 10 MG tablet Take 1 tablet (10 mg total) by mouth 3 (three) times daily. 270 tablet 3 01/22/2023   cetirizine (ZYRTEC) 10 MG tablet Take 10 mg by mouth at bedtime.    Past Week   Cholecalciferol (VITAMIN D3 PO) Take 2,000 Units by mouth daily.   01/22/2023   dapagliflozin propanediol (FARXIGA) 10 MG TABS tablet TAKE ONE TABLET BY MOUTH BEFORE BREAKFAST (Patient taking differently: Take 10 mg by mouth daily with breakfast.) 90 tablet 3 01/22/2023   diltiazem (CARDIZEM CD) 120 MG 24 hr capsule TAKE ONE CAPSULE BY MOUTH ONCE DAILY 30 capsule 2 01/22/2023   ELIQUIS 5 MG TABS tablet TAKE ONE TABLET BY MOUTH TWICE DAILY (Patient taking differently: Take 5 mg by mouth 2 (two) times daily.) 180 tablet 2 01/22/2023 at 0800   fenofibrate (TRICOR) 145 MG tablet  Take 1 tablet (145 mg total) by mouth daily. 90 tablet 3 01/22/2023   ferrous sulfate 324 (65 Fe) MG TBEC Take 324 mg by mouth daily with breakfast.    01/22/2023   fluticasone (FLONASE) 50 MCG/ACT nasal spray Place 2 sprays into both nostrils daily.    01/22/2023   Fluticasone-Umeclidin-Vilant (TRELEGY ELLIPTA) 200-62.5-25 MCG/ACT AEPB INHALE 1 PUFF BY MOUTH INTO LUNGS ONCE DAILY (Patient taking differently: Inhale 1 puff into the lungs daily.) 60 each 11 01/22/2023   Lacosamide 150 MG TABS Take 1 tablet (150 mg total) by mouth 2 (two) times daily. 180 tablet 3 01/22/2023   levETIRAcetam (KEPPRA) 500 MG tablet Take 1 tablet (500 mg total) by mouth 2 (two) times daily. 180 tablet 3 01/22/2023   losartan (COZAAR) 25 MG tablet TAKE ONE TABLET BY MOUTH ONCE DAILY 30 tablet 2 01/22/2023   metFORMIN (GLUCOPHAGE-XR) 500 MG 24 hr tablet Take 4 tablets (2,000 mg total) by mouth daily. 360 tablet 3 01/22/2023   metolazone (ZAROXOLYN) 2.5 MG tablet Take 1 tablet (2.5 mg total) by mouth once a week. Every Tuesday 20 tablet 3 Past Week   metoprolol succinate (TOPROL-XL) 25 MG 24 hr tablet Take 1 tablet (25 mg total) by mouth daily. 90 tablet 1 01/22/2023 at 0800   mirtazapine (REMERON) 15 MG tablet TAKE ONE TABLET BY MOUTH EVERYDAY AT BEDTIME (Patient taking differently: Take 15 mg by mouth at bedtime.) 30 tablet 2 Past Week   omeprazole (PRILOSEC) 20 MG capsule Take 20 mg by mouth every morning.   01/22/2023   potassium chloride SA (KLOR-CON M) 20 MEQ tablet Take 2 tablets (40 mEq total) by mouth 2 (two) times daily. Except Tuesdays when you take ( 3 Tabs) 270 tablet 3 01/22/2023   prazosin (MINIPRESS) 2 MG capsule Take two tablets at bedtime. (Patient taking differently: Take 2 mg by mouth at bedtime.) 180 capsule 3 Past Week   pregabalin (LYRICA) 150 MG capsule Take 1 capsule (150 mg total) by mouth 3 (three) times daily. 270 capsule 3 01/22/2023   repaglinide (PRANDIN) 2 MG tablet TAKE TWO TABLETS BY MOUTH TWICE  DAILY BEFORE A meal (Patient taking differently: Take 2 mg by mouth 2 (two) times daily before a meal.) 120 tablet 0 01/22/2023   spironolactone (ALDACTONE) 25 MG tablet Take 0.5 tablets (12.5 mg total) by mouth daily. 45 tablet 3 01/22/2023   tirzepatide (MOUNJARO) 12.5 MG/0.5ML Pen Inject  12.5 mg into the skin once a week. 6 mL 0 Past Week   torsemide (DEMADEX) 20 MG tablet Take 5 tablets (100 mg total) by mouth 2 (two) times daily. 900 tablet 3 01/22/2023   traZODone (DESYREL) 100 MG tablet Take 200 mg by mouth at bedtime.   Past Week   VASCEPA 1 g capsule TAKE TWO CAPSULES BY MOUTH TWICE DAILY 120 capsule 5 01/22/2023   venlafaxine XR (EFFEXOR-XR) 75 MG 24 hr capsule Take 3 capsules (225 mg total) by mouth daily with breakfast. 270 capsule 3 01/22/2023   diclofenac Sodium (VOLTAREN) 1 % GEL Apply 4 g topically 4 (four) times daily. To right knee. (Patient not taking: Reported on 01/23/2023) 100 g 0 Not Taking   Insulin Pen Needle (B-D ULTRAFINE III SHORT PEN) 31G X 8 MM MISC Inject 1 each into the skin 3 (three) times daily before meals. Use as instructed 4X daily. 100 each 5 unknown    Assessment: Patient is a 67 year old male with history of Afib on Eliquis, CAD, CVA. Presented with severe aortic stenosis with plan for TAVR on 7/2. Last dose of apixaban 6/26 @ 0826. S/p dental extractions 6/28.   Heparin level therapeutic, CBC stable, TAVR later today.  Goal of Therapy:  Heparin level 0.3-0.7 units/ml Monitor platelets by anticoagulation protocol: Yes   Plan:  Continue heparin 2000 units/h Daily heparin level and CBC  Fredonia Highland, PharmD, [place], [facility] Clinical Pharmacist [phone] Please check AMION for all [facility] Pharmacy numbers 02/07/2023

## 2023-02-07 NOTE — Care Management Important Message (Signed)
Important Message  Patient Details  Name: Lawrence [name] MRN: [id number] Date of Birth: April 11, 1956   Medicare Important Message Given:  Yes     Renie Ora 02/07/2023, 11:07 AM

## 2023-02-07 NOTE — Op Note (Signed)
[facility]   MULTIDISCIPLINARY HEART VALVE TEAM     TAVR OPERATIVE NOTE   Taksh A. Basra [id number]  Date of Procedure:                 02/07/2023   Preoperative Diagnosis:      Severe Aortic Stenosis    Postoperative Diagnosis:    Same    Procedure:        Transcatheter Aortic Valve Replacement - Percutaneous Right Transfemoral Approach             Medtronic Evolut FX  (size 34 mm, serial # [id number])              Co-Surgeons:            Alleen Borne, MD and Alverda Skeans, MD     Anesthesiologist:                  Adonis Huguenin, MD   Echocardiographer:              Alveta Heimlich, MD   Pre-operative Echo Findings: Severe aortic stenosis  Normal left ventricular systolic function   Post-operative Echo Findings: No paravalvular leak Normal left ventricular systolic function      BRIEF CLINICAL NOTE AND INDICATIONS FOR SURGERY    This 67 year old gentleman has stage D, severe, symptomatic aortic stenosis admitted with NYHA class IV symptoms of worsening shortness of breath and chest discomfort with hypoxemic respiratory failure and marked volume overload consistent with acute on chronic diastolic congestive heart failure.  I have personally reviewed his 2D echocardiogram, cardiac catheterization, and CTA studies.  His echocardiogram showed a thickened and calcified aortic valve with restricted leaflet mobility with a mean gradient of 40 mmHg and a peak gradient of 66 mmHg with a valve area by VTI of 1.03 cm.  Left ventricular ejection fraction was 60 to 65%.  Cardiac catheterization showed mild to moderate diffuse obstructive coronary disease without high-grade stenosis.  I agree that aortic valve replacement is indicated in this patient for relief of his symptoms and to prevent recurrent congestive heart failure and left ventricular deterioration.  Given his morbid obesity and multiple comorbid risk factors I do not think he would be a candidate for open  surgical aortic valve replacement.  I think transcatheter aortic valve replacement would be a reasonable alternative.  His gated cardiac CTA shows anatomy suitable for TAVR using a 29 mm SAPIEN 3 valve.  His abdominal and pelvic CTA shows adequate pelvic vascular anatomy to allow transfemoral insertion.   The patient was counseled at length regarding treatment alternatives for management of severe symptomatic aortic stenosis. The risks and benefits of surgical intervention has been discussed in detail. Long-term prognosis with medical therapy was discussed. Alternative approaches such as conventional surgical aortic valve replacement, transcatheter aortic valve replacement, and palliative medical therapy were compared and contrasted at length. This discussion was placed in the context of the patient's own specific clinical presentation and past medical history. All of his questions have been addressed.    Following the decision to proceed with transcatheter aortic valve replacement, a discussion was held regarding what types of management strategies would be attempted intraoperatively in the event of life-threatening complications, including whether or not the patient would be considered a candidate for the use of cardiopulmonary bypass and/or conversion to open sternotomy for attempted surgical intervention.  I do not think he is a candidate for median sternotomy to manage any intraoperative  complications.  The patient is aware of the fact that transient use of cardiopulmonary bypass may be necessary. The patient has been advised of a variety of complications that might develop including but not limited to risks of death, stroke, paravalvular leak, aortic dissection or other major vascular complications, aortic annulus rupture, device embolization, cardiac rupture or perforation, mitral regurgitation, acute myocardial infarction, arrhythmia, heart block or bradycardia requiring permanent pacemaker placement,  congestive heart failure, respiratory failure, renal failure, pneumonia, infection, other late complications related to structural valve deterioration or migration, or other complications that might ultimately cause a temporary or permanent loss of functional independence or other long term morbidity. The patient provides full informed consent for the procedure as described and all questions were answered.          DETAILS OF THE OPERATIVE PROCEDURE    PREPARATION:    The patient was brought to the operating room on the above mentioned date and appropriate monitoring was established by the anesthesia team. The patient was placed in the supine position on the operating table.  Intravenous antibiotics were administered. The patient was monitored closely throughout the procedure under conscious sedation.  Baseline transthoracic echocardiogram was performed. The patient's abdomen and both groins were prepped and draped in a sterile manner. A time out procedure was performed.   PERIPHERAL ACCESS:    Using the modified Seldinger technique, left radial arterial and right internal jugular venous access was obtained with placement of 6 Fr sheaths. A pigtail diagnostic catheter was passed through the radial arterial sheath under fluoroscopic guidance into the aortic root.  A temporary transvenous pacemaker catheter was passed through the IJ venous sheath under fluoroscopic guidance into the right ventricle.  The pacemaker was tested to ensure stable lead placement and pacemaker capture. Aortic root angiography was performed in order to determine the optimal angiographic angle for valve deployment.    TRANSFEMORAL ACCESS:    Percutaneous transfemoral access and sheath placement was performed using ultrasound guidance.  The right common femoral artery was cannulated using a micropuncture needle.  A pair of Abbott Perclose percutaneous closure devices were placed and a 6 French sheath replaced into the  femoral artery.  The patient was heparinized systemically and ACT verified > 250 seconds.     A 22 Fr transfemoral Gore Dry-Seal sheath was introduced into the right femoral artery after progressively dilating over an Amplatz superstiff wire. An AL-1 catheter was used to direct a straight-tip exchange length wire across the native aortic valve into the left ventricle. This was exchanged out for a pigtail catheter and position was confirmed in the LV apex. Simultaneous LV and Ao pressures were recorded.  The pigtail catheter was exchanged for a Safari wire in the LV apex.    BALLOON AORTIC VALVULOPLASTY:    Performed using a 26 mm True balloon under rapid pacing.    TRANSCATHETER HEART VALVE DEPLOYMENT:    A Medtronic Evolut FX transcatheter heart valve (size 34 mm) was prepared and loaded into the delivery catheter system per manufacturer's guidelines and the proper orientation of the valve is confirmed under fluoroscopy. The delivery system and inline sheath were inserted into the right common femoral artery over the [facility] wire and the inline sheath advanced into the abdominal aorta under fluoroscopic guidance. The delivery catheter was advanced around the aortic arch and the valve was carefully positioned across the aortic valve annulus. An aortic root injection was performed to confirm position and the valve deployed using cusp overlap technique under  fluoroscopic guidance. Rapid pacing was used during valve deployment. The delivery system and guidewire were retracted into the descending aorta and the nosecone re-sheathed. Valve function was assessed using echocardiography. There is felt to be mild paravalvular leak and no central aortic insufficiency. The patient's hemodynamic recovery following valve deployment is good. Post-dilation using the 26 mm True balloon was performed while rapid pacing and repeat echo assessment showed no paravalvular leak. Rhythm remained stable in sinus.      PROCEDURE  COMPLETION:    The delivery system and in-line sheath were removed and femoral artery closure performed using the Perclose devices.  Protamine was administered once femoral arterial repair was complete. The pigtail was moved to the abdominal aorta and aortogram showed trivial extravasation from the femoral artery and no stenosis. The temporary pacemaker, pigtail catheters and femoral sheaths were removed with manual pressure used venous for hemostasis and a TR band for radial arterial hemostasis.    The patient tolerated the procedure well and is transported to the cath lab recovery area in stable condition. There were no immediate intraoperative complications. All sponge instrument and needle counts are verified correct at completion of the operation.    No blood products were administered during the operation.   The patient received a total of 110 mL of intravenous contrast during the procedure.     Alleen Borne, MD

## 2023-02-07 NOTE — Progress Notes (Signed)
PROGRESS NOTE  Lawrence [name]  ZOX:[id number] DOB: [birth year] DOA: 01/22/2023 PCP: [name], PA-C   Brief Narrative: Patient is a 67 year old male with history of hypertension, chronic diastolic CHF, paroxysmal A-fib on Eliquis, aortic stenosis, coronary artery disease, CVA, diabetes type 2, COPD, AAA, morbid obesity, OSA on CPAP who presented with shortness of breath, intermittent left-sided chest pain for 2 weeks.  Workup showed severe aortic stenosis.   Due to poor dental condition, oral surgery was consulted and s/p  tooth extraction . Cardiology consulted, plan for TAVR on 7/2.  Assessment & Plan:  Principal Problem:   Acute hypoxic respiratory failure (HCC) Active Problems:   Chest pain   Chronic diastolic CHF (congestive heart failure) (HCC)   OSA (obstructive sleep apnea)   Type 2 diabetes mellitus with hyperlipidemia (HCC)   Essential hypertension   Seizure disorder (HCC)   AF (paroxysmal atrial fibrillation) (HCC)   COPD (chronic obstructive pulmonary disease) (HCC)  Acute hypoxic respiratory failure: Most likely multifactorial secondary to volume overload due to severe aortic stenosis/dCHF/OSA/obesity hypoventilation syndrome.  CTA chest negative for PE.  Continue bronchodilators as needed.  Not on oxygen at home.  Currently on 3 to 4 L of oxygen.  On torsemide, spironolactone, given a dose of metolazone earlier.  Will need to check if he qualifies for home oxygen before discharge.  Acute on chronic diastolic CHF: Echocardiogram done in May showed EF of 65%, severe aortic stenosis.    Severe aortic stenosis: TAVR planned for 7/2.  Left heart cath negative for obstructive disease.  Cardiology following  COPD: Continue home inhalers.  Continue bronchodilators as needed.  Not on oxygen at home.   Diabetes type 2: Takes insulin at home.  Recent A1c of 6.4.  Continue current insulin regimen.  Monitor blood sugars  Paroxysmal A-fib: Currently in normal sinus rhythm.  On  Eliquis for anticoagulation.  On Cardizem for rate control.  On  heparin drip for preparation for TAVR  Hypertension: Continue monitor blood pressure, continue current medications  Anxiety/depression: BuSpar, Abilify, venlafaxine  History of seizure disorder: On Keppra, lacosamide  History of OSA: On CPAP  Morbid obesity: BMI 47         DVT prophylaxis:SCDs Start: 01/23/23 0527     Code Status: DNR  Family Communication: Wife at bedside on 7/2  Patient status:Inpatient  Patient is from :Home  Anticipated discharge WJ:XBJY  Estimated DC date:after TAVR   Consultants: Cardiology  Procedures:None  Antimicrobials:  Anti-infectives (From admission, onward)    Start     Dose/Rate Route Frequency Ordered Stop   02/07/23 0400  ceFAZolin (ANCEF) IVPB 3g/100 mL premix        3 g 200 mL/hr over 30 Minutes Intravenous To Surgery 02/06/23 1010 02/08/23 0400   02/03/23 0715  ceFAZolin (ANCEF) IVPB 3g/100 mL premix        3 g 200 mL/hr over 30 Minutes Intravenous On call to O.R. 02/03/23 7829 02/03/23 0814   01/22/23 0000  cephALEXin (KEFLEX) 500 MG capsule        500 mg Oral 4 times daily 01/22/23 2347         Subjective: Patient seen and examined the bedside today.  Hemodynamically stable.  Waiting TAVR today.  Comfortable.  He was on room air after he was cleaned.  Denies any shortness of breath or cough.  Objective: Vitals:   02/07/23 0629 02/07/23 0802 02/07/23 0900 02/07/23 1049  BP: 106/74  120/71 (!) 143/79  Pulse: 71 74 68  70  Resp: 18 18 20 18   Temp: 98.2 F (36.8 C)  98.1 F (36.7 C) 98.3 F (36.8 C)  TempSrc: Oral  Oral Oral  SpO2: 91% 92% 90% 90%  Weight: (!) 165.2 kg   (!) 163.3 kg  Height:    6\' 2"  (1.88 m)    Intake/Output Summary (Last 24 hours) at 02/07/2023 1137 Last data filed at 02/07/2023 9604 Gross per 24 hour  Intake 519.17 ml  Output 2750 ml  Net -2230.83 ml   Filed Weights   02/06/23 0344 02/07/23 0629 02/07/23 1049  Weight: (!)  165.6 kg (!) 165.2 kg (!) 163.3 kg    Examination:   General exam: Overall comfortable, not in distress, morbidly obese HEENT: PERRL Respiratory system:  no wheezes or crackles, mildly diminished sounds bilaterally on bases Cardiovascular system: S1 & S2 heard, RRR.  Gastrointestinal system: Abdomen is nondistended, soft and nontender. Central nervous system: Alert and oriented Extremities: trace bilateral lower extremity edema, no clubbing ,no cyanosis Skin: No rashes, no ulcers,no icterus     Data Reviewed: I have personally reviewed following labs and imaging studies  CBC: Recent Labs  Lab 02/03/23 0244 02/04/23 0218 02/05/23 0250 02/06/23 0818 02/07/23 0648  WBC 10.4 14.8* 12.6* 9.3 9.2  HGB 13.4 14.1 14.3 12.8* 12.6*  HCT 41.6 43.2 44.1 39.6 38.7*  MCV 91.0 89.8 90.4 91.0 90.8  PLT 265 285 224 213 229   Basic Metabolic Panel: Recent Labs  Lab 02/03/23 0244 02/03/23 1429 02/04/23 0218 02/05/23 0250 02/06/23 0818 02/07/23 0648  NA 137 135 135 135 134* 138  K 3.1* 4.0 3.5 3.6 3.3* 3.8  CL 99 98 97* 95* 98 97*  CO2 28 28 28 30 26 26   GLUCOSE 131* 208* 211* 170* 199* 153*  BUN 22 17 20  27* 21 23  CREATININE 1.05 0.93 0.93 1.03 0.83 0.86  CALCIUM 8.7* 8.9 9.0 9.0 8.6* 8.8*  MG 2.0  --  2.1 2.0 1.8 2.0     Recent Results (from the past 240 hour(s))  Surgical PCR screen     Status: Abnormal   Collection Time: 02/03/23  2:00 AM   Specimen: Nasal Mucosa; Nasal Swab  Result Value Ref Range Status   MRSA, PCR NEGATIVE NEGATIVE Final   Staphylococcus aureus POSITIVE (A) NEGATIVE Final    Comment: (NOTE) The Xpert SA Assay (FDA approved for NASAL specimens in patients 20 years of age and older), is one component of a comprehensive surveillance program. It is not intended to diagnose infection nor to guide or monitor treatment. Performed at [facility], [address], [place], [place] [address]      Radiology Studies: DG Chest 2 View  Result  Date: 02/06/2023 CLINICAL DATA:  Preoperative evaluation, initial encounter EXAM: CHEST - 2 VIEW COMPARISON:  01/22/2023 FINDINGS: Cardiac shadow is enlarged but stable. Lungs are well aerated bilaterally. No focal infiltrate or effusion is seen. No bony abnormality is noted. CardioMEMS device is noted. IMPRESSION: No acute abnormality noted. Electronically Signed   By: Alcide Clever M.D.   On: 02/06/2023 22:04    Scheduled Meds:  [MAR Hold] allopurinol  200 mg Oral BID   [MAR Hold] ARIPiprazole  5 mg Oral QHS   [MAR Hold] atorvastatin  40 mg Oral Daily   [MAR Hold] busPIRone  10 mg Oral TID   [MAR Hold] chlorhexidine  4 Application Topical Once   chlorhexidine  15 mL Mouth/Throat Once   [MAR Hold] diclofenac Sodium  4  g Topical QID   [MAR Hold] fenofibrate  160 mg Oral Daily   [MAR Hold] fluticasone furoate-vilanterol  1 puff Inhalation Daily   And   [MAR Hold] umeclidinium bromide  1 puff Inhalation Daily   [MAR Hold] insulin aspart  0-15 Units Subcutaneous TID WC   [MAR Hold] insulin aspart  0-5 Units Subcutaneous QHS   [MAR Hold] insulin aspart  3 Units Subcutaneous TID WC   [MAR Hold] insulin glargine-yfgn  30 Units Subcutaneous Daily   [MAR Hold] lacosamide  150 mg Oral BID   [MAR Hold] levETIRAcetam  500 mg Oral BID   [MAR Hold] lidocaine  1 patch Transdermal Q24H   magnesium sulfate  40 mEq Other To OR   [MAR Hold] metoprolol succinate  50 mg Oral Daily   [MAR Hold] mirtazapine  15 mg Oral QHS   mupirocin ointment  1 Application Nasal BID   [MAR Hold] pantoprazole  40 mg Oral BID   potassium chloride  80 mEq Other To OR   [MAR Hold] prazosin  2 mg Oral QHS   [MAR Hold] pregabalin  150 mg Oral TID   [MAR Hold] spironolactone  12.5 mg Oral Daily   [MAR Hold] traZODone  200 mg Oral QHS   [MAR Hold] venlafaxine XR  225 mg Oral Q breakfast   Continuous Infusions:   ceFAZolin (ANCEF) IV     dexmedetomidine     heparin 30,000 units/NS 1000 mL solution for CELLSAVER     heparin  2,000 Units/hr (02/07/23 0428)   lactated ringers     norepinephrine (LEVOPHED) Adult infusion        LOS: 15 days   Burnadette Pop, MD [facility] P7/09/2022, 11:37 AM

## 2023-02-08 ENCOUNTER — Other Ambulatory Visit (HOSPITAL_COMMUNITY): Payer: Self-pay

## 2023-02-08 ENCOUNTER — Inpatient Hospital Stay (HOSPITAL_COMMUNITY): Payer: Medicare HMO

## 2023-02-08 ENCOUNTER — Inpatient Hospital Stay (HOSPITAL_BASED_OUTPATIENT_CLINIC_OR_DEPARTMENT_OTHER)
Admit: 2023-02-08 | Discharge: 2023-02-08 | Disposition: A | Discharge status: Home / Self Care | Payer: Medicare HMO | Attending: Physician Assistant | Admitting: Physician Assistant

## 2023-02-08 ENCOUNTER — Encounter (HOSPITAL_COMMUNITY): Payer: Self-pay | Admitting: Internal Medicine

## 2023-02-08 ENCOUNTER — Other Ambulatory Visit (HOSPITAL_COMMUNITY): Payer: Medicare HMO | Admitting: Cardiology

## 2023-02-08 DIAGNOSIS — I4510 Unspecified right bundle-branch block: Secondary | ICD-10-CM | POA: Diagnosis not present

## 2023-02-08 DIAGNOSIS — I447 Left bundle-branch block, unspecified: Secondary | ICD-10-CM

## 2023-02-08 DIAGNOSIS — Z952 Presence of prosthetic heart valve: Secondary | ICD-10-CM

## 2023-02-08 LAB — CBC
HCT: 37.2 % — ABNORMAL LOW (ref 39.0–52.0)
Hemoglobin: 11.9 g/dL — ABNORMAL LOW (ref 13.0–17.0)
MCH: 29 pg (ref 26.0–34.0)
MCHC: 32 g/dL (ref 30.0–36.0)
MCV: 90.7 fL (ref 80.0–100.0)
Platelets: 218 10*3/uL (ref 150–400)
RBC: 4.1 MIL/uL — ABNORMAL LOW (ref 4.22–5.81)
RDW: 15 % (ref 11.5–15.5)
WBC: 14.2 10*3/uL — ABNORMAL HIGH (ref 4.0–10.5)
nRBC: 0 % (ref 0.0–0.2)

## 2023-02-08 LAB — ECHOCARDIOGRAM COMPLETE
AR max vel: 1.91 cm2
AV Area VTI: 1.92 cm2
AV Area mean vel: 1.95 cm2
AV Mean grad: 17 mmHg
AV Peak grad: 33.9 mmHg
Ao pk vel: 2.91 m/s
Height: 74 in
S' Lateral: 3.2 cm
Weight: 5901.27 oz

## 2023-02-08 LAB — BASIC METABOLIC PANEL
Anion gap: 7 (ref 5–15)
BUN: 14 mg/dL (ref 8–23)
CO2: 27 mmol/L (ref 22–32)
Calcium: 8.3 mg/dL — ABNORMAL LOW (ref 8.9–10.3)
Chloride: 102 mmol/L (ref 98–111)
Creatinine, Ser: 0.95 mg/dL (ref 0.61–1.24)
GFR, Estimated: >60 mL/min (ref >60)
Glucose, Bld: 220 mg/dL — ABNORMAL HIGH (ref 70–99)
Potassium: 3.6 mmol/L (ref 3.5–5.1)
Sodium: 136 mmol/L (ref 135–145)

## 2023-02-08 LAB — MAGNESIUM: Magnesium: 1.8 mg/dL (ref 1.7–2.4)

## 2023-02-08 LAB — GLUCOSE, CAPILLARY
Glucose-Capillary: 194 mg/dL — ABNORMAL HIGH (ref 70–99)
Glucose-Capillary: 210 mg/dL — ABNORMAL HIGH (ref 70–99)

## 2023-02-08 MED ORDER — TORSEMIDE 20 MG PO TABS: 80.0000 mg | ORAL_TABLET | Freq: Two times a day (BID) | ORAL | Status: DC

## 2023-02-08 MED ORDER — METOPROLOL SUCCINATE ER 50 MG PO TB24
50.0000 mg | ORAL_TABLET | Freq: Every day | ORAL | 0 refills | Status: DC
  Filled 2023-02-08: qty 30, 30d supply, fill #0

## 2023-02-08 MED ORDER — DAPAGLIFLOZIN PROPANEDIOL 10 MG PO TABS
10.0000 mg | ORAL_TABLET | Freq: Every day | ORAL | Status: DC
  Administered 2023-02-08: 10 mg via ORAL
  Filled 2023-02-08: qty 1

## 2023-02-08 MED ORDER — POTASSIUM CHLORIDE CRYS ER 20 MEQ PO TBCR: 40.0000 meq | EXTENDED_RELEASE_TABLET | Freq: Two times a day (BID) | ORAL | Status: DC

## 2023-02-08 MED ORDER — MAGNESIUM SULFATE 2 GM/50ML IV SOLN
2.0000 g | Freq: Once | INTRAVENOUS | Status: AC
  Administered 2023-02-08: 2 g via INTRAVENOUS
  Filled 2023-02-08: qty 50

## 2023-02-08 MED ORDER — PRAZOSIN HCL 2 MG PO CAPS
2.0000 mg | ORAL_CAPSULE | Freq: Every day | ORAL | Status: DC
Start: 2023-02-08 — End: 2023-05-14

## 2023-02-08 MED ORDER — CEPHALEXIN 500 MG PO CAPS
500.0000 mg | ORAL_CAPSULE | Freq: Four times a day (QID) | ORAL | 0 refills | Status: DC
  Filled 2023-02-08: qty 20, 5d supply, fill #0

## 2023-02-08 MED ORDER — TORSEMIDE 20 MG PO TABS: 20.0000 mg | ORAL_TABLET | Freq: Every day | ORAL | Status: DC

## 2023-02-08 MED ORDER — TORSEMIDE 20 MG PO TABS
80.0000 mg | ORAL_TABLET | Freq: Every day | ORAL | Status: DC
  Administered 2023-02-08: 80 mg via ORAL
  Filled 2023-02-08: qty 4

## 2023-02-08 MED ORDER — POTASSIUM CHLORIDE CRYS ER 20 MEQ PO TBCR
40.0000 meq | EXTENDED_RELEASE_TABLET | Freq: Once | ORAL | Status: AC
  Administered 2023-02-08: 40 meq via ORAL
  Filled 2023-02-08: qty 2

## 2023-02-08 NOTE — Progress Notes (Addendum)
PROGRESS NOTE    Lawrence [name]  ZOX:[id number] DOB: 06/13/1956 DOA: 01/22/2023 PCP: [name], PA-C  67/M with history of morbid obesity, chronic diastolic CHF, paroxysmal A-fib, sleep apnea, recent diagnosis of severe aortic stenosis was admitted with hypoxic respiratory failure secondary to volume overload, worsening AS -Noted to have dental issues, oral surgery was consulted, underwent extensive tooth extractions on 6/28 by Dr. [name] -Cardiology following, seen by structural heart team, underwent TAVR 7/2   Subjective: -Feels better overall, no complaints this morning  Assessment and Plan:  Acute hypoxic respiratory failure:  Most likely multifactorial secondary to volume overload due to severe aortic stenosis/dCHF/OSA/obesity hypoventilation syndrome.  CTA chest negative for PE.  Continue bronchodilators as needed.   -Wean O2 as tolerated, add IS -Volume status has improved significantly, currently on oral torsemide, Aldactone -Check ambulatory O2 sats tomorrow a.m.  Acute on chronic diastolic CHF -Echo 5/24 with EF 60-65%, preserved RV, severe aortic stenosis  -Right heart cath 01/26/2023 with PA 34/15, wedge of 14  -Diuresed significantly with IV Lasix, he is 31.7 L negative -Heart failure team consulting, continue torsemide, Aldactone, Farxiga -Home tomorrow if stable -Plan for amyloidosis workup as outpatient   Severe aortic stenosis: -sp TAVR 7/2.   -Repeat echo with no apparent paravalvular leak and preserved EF -New LBBB noted, plan for Zio patch at discharge   COPD: Continue home inhalers.  Continue bronchodilators as needed.  Not on oxygen at home.   Diabetes type 2: Takes insulin at home.  Recent A1c of 6.4.   -CBGs elevated yesterday however did not get long-acting insulin as he was n.p.o.  -Continue Semglee and meal coverage insulin  -Restarting Farxiga   Paroxysmal A-fib: Currently in normal sinus rhythm.  -Cardizem discontinued, continue  Toprol   Anxiety/depression: BuSpar, Abilify, venlafaxine   History of seizure disorder: On Keppra, lacosamide   History of OSA: On CPAP   Morbid obesity: BMI 47         DVT prophylaxis: Apixaban       Code Status: DNR Family Communication: No family at bedside Disposition Plan: Home tomorrow if stable  Consultants: CT surgery, cardiology   Procedures: TAVR 7/2 by Dr. Lynnette Caffey and Bartle  Antimicrobials:    Objective: Vitals:   02/08/23 0300 02/08/23 0636 02/08/23 0655 02/08/23 0714  BP:   101/78 134/68  Pulse:   (!) 105 (!) 103  Resp:   18 18  Temp:   98.5 F (36.9 C) 98 F (36.7 C)  TempSrc:   Oral Oral  SpO2:   93% 93%  Weight: (!) 175.7 kg (!) 167.3 kg    Height:        Intake/Output Summary (Last 24 hours) at 02/08/2023 1111 Last data filed at 02/08/2023 0514 Gross per 24 hour  Intake 2555.17 ml  Output 1925 ml  Net 630.17 ml   Filed Weights   02/07/23 1049 02/08/23 0300 02/08/23 0636  Weight: (!) 163.3 kg (!) 175.7 kg (!) 167.3 kg    Examination:  General exam: Obese chronically ill male sitting up in bed, AAOx3 HEENT: Neck obese unable to assess JVD CVS: S1-S2, regular rhythm, systolic murmur Lungs: Decreased breath sounds otherwise clear Abdomen: Soft, obese, nontender, bowel sounds present Extremities: Trace edema, chronic skin changes Psychiatry:  Mood & affect appropriate.     Data Reviewed:   CBC: Recent Labs  Lab 02/04/23 0218 02/05/23 0250 02/06/23 0818 02/07/23 0648 02/08/23 0053  WBC 14.8* 12.6* 9.3 9.2 14.2*  HGB 14.1 14.3  12.8* 12.6* 11.9*  HCT 43.2 44.1 39.6 38.7* 37.2*  MCV 89.8 90.4 91.0 90.8 90.7  PLT 285 224 213 229 218   Basic Metabolic Panel: Recent Labs  Lab 02/04/23 0218 02/05/23 0250 02/06/23 0818 02/07/23 0648 02/08/23 0053  NA 135 135 134* 138 136  K 3.5 3.6 3.3* 3.8 3.6  CL 97* 95* 98 97* 102  CO2 28 30 26 26 27   GLUCOSE 211* 170* 199* 153* 220*  BUN 20 27* 21 23 14   CREATININE 0.93 1.03 0.83  0.86 0.95  CALCIUM 9.0 9.0 8.6* 8.8* 8.3*  MG 2.1 2.0 1.8 2.0 1.8   GFR: Estimated Creatinine Clearance: 127.4 mL/min (by C-G formula based on SCr of 0.95 mg/dL). Liver Function Tests: Recent Labs  Lab 02/07/23 0648  AST 13*  ALT 18  ALKPHOS 52  BILITOT 0.8  PROT 5.9*  ALBUMIN 3.0*   No results for input(s): "LIPASE", "AMYLASE" in the last 168 hours. No results for input(s): "AMMONIA" in the last 168 hours. Coagulation Profile: Recent Labs  Lab 02/05/23 1147  INR 1.0   Cardiac Enzymes: No results for input(s): "CKTOTAL", "CKMB", "CKMBINDEX", "TROPONINI" in the last 168 hours. BNP (last 3 results) No results for input(s): "PROBNP" in the last 8760 hours. HbA1C: Recent Labs    02/07/23 0648  HGBA1C 6.4*   CBG: Recent Labs  Lab 02/06/23 2121 02/07/23 0859 02/07/23 1052 02/07/23 2030 02/08/23 0632  GLUCAP 176* 152* 141* 273* 194*   Lipid Profile: No results for input(s): "CHOL", "HDL", "LDLCALC", "TRIG", "CHOLHDL", "LDLDIRECT" in the last 72 hours. Thyroid Function Tests: No results for input(s): "TSH", "T4TOTAL", "FREET4", "T3FREE", "THYROIDAB" in the last 72 hours. Anemia Panel: No results for input(s): "VITAMINB12", "FOLATE", "FERRITIN", "TIBC", "IRON", "RETICCTPCT" in the last 72 hours. Urine analysis:    Component Value Date/Time   COLORURINE YELLOW 02/06/2023 2145   APPEARANCEUR HAZY (A) 02/06/2023 2145   LABSPEC 1.018 02/06/2023 2145   PHURINE 5.0 02/06/2023 2145   GLUCOSEU 150 (A) 02/06/2023 2145   HGBUR NEGATIVE 02/06/2023 2145   BILIRUBINUR NEGATIVE 02/06/2023 2145   KETONESUR NEGATIVE 02/06/2023 2145   PROTEINUR NEGATIVE 02/06/2023 2145   NITRITE NEGATIVE 02/06/2023 2145   LEUKOCYTESUR NEGATIVE 02/06/2023 2145   Sepsis Labs: @LABRCNTIP (procalcitonin:4,lacticidven:4)  ) Recent Results (from the past 240 hour(s))  Surgical PCR screen     Status: Abnormal   Collection Time: 02/03/23  2:00 AM   Specimen: Nasal Mucosa; Nasal Swab  Result  Value Ref Range Status   MRSA, PCR NEGATIVE NEGATIVE Final   Staphylococcus aureus POSITIVE (A) NEGATIVE Final    Comment: (NOTE) The Xpert SA Assay (FDA approved for NASAL specimens in patients 31 years of age and older), is one component of a comprehensive surveillance program. It is not intended to diagnose infection nor to guide or monitor treatment. Performed at [facility], [address] [place], [place] [address]      Radiology Studies: ECHOCARDIOGRAM LIMITED  Result Date: 02/07/2023    ECHOCARDIOGRAM LIMITED REPORT   Patient Name:   Lawrence [name] Date of Exam: 02/07/2023 Medical Rec #:  [id number]        Height:       74.0 in Accession #:    [id number]       Weight:       360.0 lb Date of Birth:  [birth year]        BSA:          2.790 m Patient Age:  66 years         BP:           106/74 mmHg Patient Gender: M                HR:           72 bpm. Exam Location:  Inpatient Procedure: Cardiac Doppler, Color Doppler and Limited Echo Indications:     I35.0 Nonrheumatic aortic (valve) stenosis  History:         Patient has prior history of Echocardiogram examinations, most                  recent 12/22/2022. CHF, Abnormal ECG, COPD and Stroke, Aortic                  Valve Disease, Arrythmias:Atrial Fibrillation and SVT,                  Signs/Symptoms:Dyspnea and Shortness of Breath; Risk                  Factors:Sleep Apnea, Hypertension, Diabetes and Dyslipidemia.                  Severe aortic stenosis.                  Aortic Valve: 34 stented (TAVR) valve is present in the aortic                  position. Procedure Date: 02/07/2023.  Sonographer:     Sheralyn Boatman RDCS Referring Phys:  [id number] Wille Celeste THOMPSON Diagnosing Phys: Riley Lam MD  Sonographer Comments: Patient is obese. Image acquisition challenging due to patient body habitus. IMPRESSIONS  1. Prior to procedure significant aortic stenosis without regurgitation. Mean gradient 30 mm Hg, Peak gradient 42 mm Hg, DVI  0.34, AVA 1.20 cm2. After procedure 34 mm Evolut Valve with mild to moderate paravalvular leak. After balloon angioplasty no aortic regurgitation (no PVL). Mean gradient 5 mm Hg, Peak gradient 10 mm Hg, EAO 3.68 cm2. There is a 34 stented (TAVR) valve present in the aortic position. Procedure Date: 02/07/2023. Echo findings are consistent with normal structure and function of the aortic valve prosthesis.  2. Left ventricular ejection fraction, by estimation, is 55 to 60%. The left ventricle has normal function.  3. Right ventricular systolic function is normal.  4. Aortic dilatation noted. There is mild dilatation of the ascending aorta, measuring 42 mm.  5. Ascending aorta measurements are within normal limits for age when indexed to body surface area. FINDINGS  Left Ventricle: Left ventricular ejection fraction, by estimation, is 55 to 60%. The left ventricle has normal function. Right Ventricle: Right ventricular systolic function is normal. Tricuspid Valve: The tricuspid valve is normal in structure. Tricuspid valve regurgitation is mild . No evidence of tricuspid stenosis. Aortic Valve: Prior to procedure significant aortic stenosis without regurgitation. Mean gradient 30 mm Hg, Peak gradient 42 mm Hg, DVI 0.34, AVA 1.20 cm2. After procedure 34 mm Evolut Valve with mild to moderate paravalvular leak. After balloon angioplasty no aortic regurgitation (no PVL). Mean gradient 5 mm Hg, Peak gradient 10 mm Hg, EAO 3.68 cm2. Aortic valve mean gradient measures 6.5 mmHg. Aortic valve peak gradient measures 11.7 mmHg. Aortic valve area, by VTI measures 6.30 cm. There is a 34 stented (TAVR) valve present in the aortic position. Procedure Date: 02/07/2023. Echo findings are consistent with normal structure and function of the aortic valve prosthesis. Aorta: Aortic  dilatation noted. Ascending aorta measurements are within normal limits for age when indexed to body surface area. There is mild dilatation of the ascending  aorta, measuring 42 mm. Additional Comments: Spectral Doppler performed. Color Doppler performed.  LEFT VENTRICLE PLAX 2D LVIDd:         5.80 cm LVIDs:         4.30 cm LV PW:         1.40 cm LV IVS:        1.00 cm LVOT diam:     3.40 cm LV SV:         271 LV SV Index:   97 LVOT Area:     9.08 cm  LV Volumes (MOD) LV vol d, MOD A2C: 109.0 ml LV vol s, MOD A2C: 45.9 ml LV vol s, MOD A4C: 53.8 ml LV SV MOD A2C:     63.2 ml AORTIC VALVE AV Area (Vmax):    6.42 cm AV Area (Vmean):   6.59 cm AV Area (VTI):     6.30 cm AV Vmax:           171.00 cm/s AV Vmean:          114.000 cm/s AV VTI:            0.431 m AV Peak Grad:      11.7 mmHg AV Mean Grad:      6.5 mmHg LVOT Vmax:         121.00 cm/s LVOT Vmean:        82.800 cm/s LVOT VTI:          0.299 m LVOT/AV VTI ratio: 0.69  AORTA Ao Asc diam: 4.20 cm  SHUNTS Systemic VTI:  0.30 m Systemic Diam: 3.40 cm Riley Lam MD Electronically signed by Riley Lam MD Signature Date/Time: 02/07/2023/3:50:17 PM    Final    Structural Heart Procedure  Result Date: 02/07/2023 See surgical note for result.  DG Chest 2 View  Result Date: 02/06/2023 CLINICAL DATA:  Preoperative evaluation, initial encounter EXAM: CHEST - 2 VIEW COMPARISON:  01/22/2023 FINDINGS: Cardiac shadow is enlarged but stable. Lungs are well aerated bilaterally. No focal infiltrate or effusion is seen. No bony abnormality is noted. CardioMEMS device is noted. IMPRESSION: No acute abnormality noted. Electronically Signed   By: Alcide Clever M.D.   On: 02/06/2023 22:04     Scheduled Meds:  allopurinol  200 mg Oral BID   apixaban  5 mg Oral BID   ARIPiprazole  5 mg Oral QHS   atorvastatin  40 mg Oral Daily   busPIRone  10 mg Oral TID   dapagliflozin propanediol  10 mg Oral Daily   diclofenac Sodium  4 g Topical QID   fenofibrate  160 mg Oral Daily   fluticasone furoate-vilanterol  1 puff Inhalation Daily   And   umeclidinium bromide  1 puff Inhalation Daily   insulin aspart  0-15  Units Subcutaneous TID WC   insulin aspart  0-5 Units Subcutaneous QHS   insulin aspart  3 Units Subcutaneous TID WC   insulin glargine-yfgn  30 Units Subcutaneous Daily   lacosamide  150 mg Oral BID   levETIRAcetam  500 mg Oral BID   lidocaine  1 patch Transdermal Q24H   metoprolol succinate  50 mg Oral Daily   mirtazapine  15 mg Oral QHS   pantoprazole  40 mg Oral BID   prazosin  2 mg Oral QHS   pregabalin  150 mg Oral TID  sodium chloride flush  3 mL Intravenous Q12H   spironolactone  12.5 mg Oral Daily   torsemide  80 mg Oral Daily   traZODone  200 mg Oral QHS   venlafaxine XR  225 mg Oral Q breakfast   Continuous Infusions:  sodium chloride     sodium chloride     magnesium sulfate bolus IVPB       LOS: 16 days    Time spent:    Zannie Cove, MD [facility]   02/08/2023, 11:11 AM

## 2023-02-08 NOTE — Progress Notes (Signed)
Occupational Therapy Treatment Patient Details Name: Lawrence [name] MRN: [id number] DOB: 1956/03/21 Today's Date: 02/08/2023   History of present illness Pt is a 67 y/o male who presents 01/22/23 with SOB and intermittent L sided CP for ~2 weeks PTA. R/L heart cath scheduled for 6/20 while inpatient. S/P heart cath 6/20. PMH significant for HTN, CHF, PAF on Eliquis, aortic stenosis, CAD, CVA, DM II, COPD, AAA.   OT comments  Pt progressing towards goals this session, needing mod A for LB ADL and min A for simulated toilet transfer. Pt min guard for bed mobility. Pt with R knee pain, ortho tech in room to don knee sleeve. Pt presenting with impairments listed below, will follow acutely. Recommend HHOT at d/c.    Recommendations for follow up therapy are one component of a multi-disciplinary discharge planning process, led by the attending physician.  Recommendations may be updated based on patient status, additional functional criteria and insurance authorization.    Assistance Recommended at Discharge Intermittent Supervision/Assistance  Patient can return home with the following  A lot of help with bathing/dressing/bathroom;Assistance with cooking/housework;Direct supervision/assist for medications management;Direct supervision/assist for financial management;Assist for transportation;Help with stairs or ramp for entrance;A lot of help with walking and/or transfers   Equipment Recommendations  None recommended by OT    Recommendations for Other Services PT consult    Precautions / Restrictions Precautions Precautions: Fall Precaution Comments: monitor HR and SATs Restrictions Weight Bearing Restrictions: No       Mobility Bed Mobility Overal bed mobility: Needs Assistance Bed Mobility: Supine to Sit     Supine to sit: Min guard          Transfers Overall transfer level: Needs assistance Equipment used: Rolling walker (2 wheels) Transfers: Sit to/from Stand Sit to Stand:  Min guard, Min assist                 Balance Overall balance assessment: Needs assistance Sitting-balance support: No upper extremity supported, Feet supported Sitting balance-Leahy Scale: Good     Standing balance support: Bilateral upper extremity supported, Reliant on assistive device for balance, During functional activity Standing balance-Leahy Scale: Poor Standing balance comment: UE support                           ADL either performed or assessed with clinical judgement   ADL Overall ADL's : Needs assistance/impaired                     Lower Body Dressing: Moderate assistance;Sitting/lateral leans;Sit to/from stand Lower Body Dressing Details (indicate cue type and reason): to don socks             Functional mobility during ADLs: Minimal assistance;Rolling walker (2 wheels);Moderate assistance      Extremity/Trunk Assessment Upper Extremity Assessment Upper Extremity Assessment: Overall WFL for tasks assessed   Lower Extremity Assessment Lower Extremity Assessment: Defer to PT evaluation        Vision   Vision Assessment?: No apparent visual deficits   Perception Perception Perception: Not tested   Praxis Praxis Praxis: Not tested    Cognition Arousal/Alertness: Awake/alert Behavior During Therapy: WFL for tasks assessed/performed, Flat affect Overall Cognitive Status: Within Functional Limits for tasks assessed  Exercises      Shoulder Instructions       General Comments VSS on RA    Pertinent Vitals/ Pain       Pain Assessment Pain Assessment: Faces Pain Score: 4  Faces Pain Scale: Hurts little more Pain Location: R knee Pain Descriptors / Indicators: Aching, Discomfort Pain Intervention(s): Limited activity within patient's tolerance, Monitored during session, Repositioned  Home Living Family/patient expects to be discharged to:: Private  residence Living Arrangements: Spouse/significant other Available Help at Discharge: Family;Available 24 hours/day;Personal care attendant Type of Home: Apartment Home Access: Level entry     Home Layout: One level     Bathroom Shower/Tub: Sponge bathes at baseline   Bathroom Toilet: Standard Bathroom Accessibility: No   Home Equipment: Rollator (4 wheels);Electric scooter;BSC/3in1   Additional Comments: Has a PCA 4 days a week for about 4 hours a day (13 hour week); wears CPAP at night, no O2 at home      Prior Functioning/Environment              Frequency  Min 1X/week        Progress Toward Goals  OT Goals(current goals can now be found in the care plan section)  Progress towards OT goals: Progressing toward goals  Acute Rehab OT Goals Patient Stated Goal: none stated OT Goal Formulation: With patient Time For Goal Achievement: 02/22/23 Potential to Achieve Goals: Fair ADL Goals Pt Will Perform Upper Body Dressing: with min guard assist;sitting Pt Will Perform Lower Body Dressing: with min guard assist;sitting/lateral leans;sit to/from stand Pt Will Transfer to Toilet: with min guard assist;ambulating;regular height toilet Additional ADL Goal #1: pt will stand x5 min for functional task in prep for ADLs  Plan Discharge plan remains appropriate;Frequency needs to be updated    Co-evaluation                 AM-PAC OT "6 Clicks" Daily Activity     Outcome Measure   Help from another person eating meals?: None Help from another person taking care of personal grooming?: A Little Help from another person toileting, which includes using toliet, bedpan, or urinal?: A Lot Help from another person bathing (including washing, rinsing, drying)?: A Lot Help from another person to put on and taking off regular upper body clothing?: A Little Help from another person to put on and taking off regular lower body clothing?: A Lot 6 Click Score: 16    End of  Session Equipment Utilized During Treatment: Rolling walker (2 wheels)  OT Visit Diagnosis: Other abnormalities of gait and mobility (R26.89);Unsteadiness on feet (R26.81);Muscle weakness (generalized) (M62.81)   Activity Tolerance Patient tolerated treatment well   Patient Left in chair;with call bell/phone within reach;with chair alarm set   Nurse Communication Mobility status        Time: 8295-6213 OT Time Calculation (min): 25 min  Charges: OT General Charges $OT Visit: 1 Visit OT Treatments $Therapeutic Activity: 23-37 mins  Carver Fila, OTD, OTR/L SecureChat Preferred Acute Rehab [phone]    Carver Fila Koonce 02/08/2023, 3:59 PM

## 2023-02-08 NOTE — Progress Notes (Addendum)
ZIO AT [id number] applied at hospital Dr. Shirlee Latch to read.

## 2023-02-08 NOTE — Progress Notes (Signed)
Orthopedic Tech Progress Note Patient Details:  Lawrence [name] 20-Dec-1955 [id number]  Went to ED and got the 3XL KNEE SLEEVE for patient. (In house all I had was small, large and XL) Stopped by ED DIRECTOR office to make sure I could use something from down there for patients upstairs and she said yes as long as they have an order   Patient ID: Lawrence [name], male   DOB: [birth year], 67 y.o.   MRN: [id number]  Lawrence [name] 02/08/2023, 3:08 PM

## 2023-02-08 NOTE — Progress Notes (Signed)
Patient requiring a good amount of pain med for his chronic pain in his right knee and foot. Patient has not had much sleep tonight because his legs seem to be restless with him trying to relieve the pain in his right leg despite pain meds given.  Patient states that, "this happens sometimes at home but not this bad".

## 2023-02-08 NOTE — Progress Notes (Addendum)
Advanced Heart Failure Rounding Note  PCP-Cardiologist: Armanda Magic, MD   Subjective:    07/02: TAVR with 34 mm Medtronic Evolut FX valve  Post-procedure echo with no paravalvular leak, preserved LV function  No dyspnea. Only complaint is right knee pain and neuropathic pain in feet.      Objective:    Weight Range: (!) 167.3 kg Body mass index is 47.35 kg/m.   Vital Signs:   Temp:  [97.6 F (36.4 C)-98.5 F (36.9 C)] 98 F (36.7 C) (07/03 0714) Pulse Rate:  [61-105] 103 (07/03 0714) Resp:  [0-20] 18 (07/03 0714) BP: (80-155)/(59-140) 134/68 (07/03 0714) SpO2:  [86 %-98 %] 93 % (07/03 0714) Weight:  [163.3 kg-175.7 kg] 167.3 kg (07/03 0636) Last BM Date : 02/06/23  Weight change: Filed Weights   02/07/23 1049 02/08/23 0300 02/08/23 0636  Weight: (!) 163.3 kg (!) 175.7 kg (!) 167.3 kg    Intake/Output:   Intake/Output Summary (Last 24 hours) at 02/08/2023 0806 Last data filed at 02/08/2023 0514 Gross per 24 hour  Intake 2555.17 ml  Output 2175 ml  Net 380.17 ml      Physical Exam    General:  Lying in bed. HEENT: normal Neck: supple. JVP difficult d/t thick neck. Carotids 2+ bilat; no bruits.  Cor: PMI nondisplaced. Regular rate & rhythm. No rubs, gallops or murmurs. Lungs: clear Abdomen: obese, soft, nontender, nondistended.  Extremities: no cyanosis, clubbing, rash, edema, + chronic venous stasis changes Neuro: alert & orientedx3. Affect pleasant     Telemetry   SR 1st degree AVB, 90s-100s, LBBB  Labs    CBC Recent Labs    02/07/23 0648 02/08/23 0053  WBC 9.2 14.2*  HGB 12.6* 11.9*  HCT 38.7* 37.2*  MCV 90.8 90.7  PLT 229 218   Basic Metabolic Panel Recent Labs    16/10/96 0648 02/08/23 0053  NA 138 136  K 3.8 3.6  CL 97* 102  CO2 26 27  GLUCOSE 153* 220*  BUN 23 14  CREATININE 0.86 0.95  CALCIUM 8.8* 8.3*  MG 2.0 1.8   Liver Function Tests Recent Labs    02/07/23 0648  AST 13*  ALT 18  ALKPHOS 52  BILITOT 0.8   PROT 5.9*  ALBUMIN 3.0*   No results for input(s): "LIPASE", "AMYLASE" in the last 72 hours. Cardiac Enzymes No results for input(s): "CKTOTAL", "CKMB", "CKMBINDEX", "TROPONINI" in the last 72 hours.  BNP: BNP (last 3 results) Recent Labs    12/05/22 1514 01/12/23 1405 01/22/23 2054  BNP 19.3 19.5 25.3    ProBNP (last 3 results) No results for input(s): "PROBNP" in the last 8760 hours.   D-Dimer No results for input(s): "DDIMER" in the last 72 hours. Hemoglobin A1C Recent Labs    02/07/23 0648  HGBA1C 6.4*   Fasting Lipid Panel No results for input(s): "CHOL", "HDL", "LDLCALC", "TRIG", "CHOLHDL", "LDLDIRECT" in the last 72 hours. Thyroid Function Tests No results for input(s): "TSH", "T4TOTAL", "T3FREE", "THYROIDAB" in the last 72 hours.  Invalid input(s): "FREET3"  Other results:   Imaging    ECHOCARDIOGRAM LIMITED  Result Date: 02/07/2023    ECHOCARDIOGRAM LIMITED REPORT   Patient Name:   Lawrence [name] Date of Exam: 02/07/2023 Medical Rec #:  [id number]        Height:       74.0 in Accession #:    [id number]       Weight:       360.0 lb Date of Birth:  [birth year]        BSA:          2.790 m Patient Age:    66 years         BP:           106/74 mmHg Patient Gender: M                HR:           72 bpm. Exam Location:  Inpatient Procedure: Cardiac Doppler, Color Doppler and Limited Echo Indications:     I35.0 Nonrheumatic aortic (valve) stenosis  History:         Patient has prior history of Echocardiogram examinations, most                  recent 12/22/2022. CHF, Abnormal ECG, COPD and Stroke, Aortic                  Valve Disease, Arrythmias:Atrial Fibrillation and SVT,                  Signs/Symptoms:Dyspnea and Shortness of Breath; Risk                  Factors:Sleep Apnea, Hypertension, Diabetes and Dyslipidemia.                  Severe aortic stenosis.                  Aortic Valve: 34 stented (TAVR) valve is present in the aortic                  position.  Procedure Date: 02/07/2023.  Sonographer:     Sheralyn Boatman RDCS Referring Phys:  [id number] Wille Celeste THOMPSON Diagnosing Phys: Riley Lam MD  Sonographer Comments: Patient is obese. Image acquisition challenging due to patient body habitus. IMPRESSIONS  1. Prior to procedure significant aortic stenosis without regurgitation. Mean gradient 30 mm Hg, Peak gradient 42 mm Hg, DVI 0.34, AVA 1.20 cm2. After procedure 34 mm Evolut Valve with mild to moderate paravalvular leak. After balloon angioplasty no aortic regurgitation (no PVL). Mean gradient 5 mm Hg, Peak gradient 10 mm Hg, EAO 3.68 cm2. There is a 34 stented (TAVR) valve present in the aortic position. Procedure Date: 02/07/2023. Echo findings are consistent with normal structure and function of the aortic valve prosthesis.  2. Left ventricular ejection fraction, by estimation, is 55 to 60%. The left ventricle has normal function.  3. Right ventricular systolic function is normal.  4. Aortic dilatation noted. There is mild dilatation of the ascending aorta, measuring 42 mm.  5. Ascending aorta measurements are within normal limits for age when indexed to body surface area. FINDINGS  Left Ventricle: Left ventricular ejection fraction, by estimation, is 55 to 60%. The left ventricle has normal function. Right Ventricle: Right ventricular systolic function is normal. Tricuspid Valve: The tricuspid valve is normal in structure. Tricuspid valve regurgitation is mild . No evidence of tricuspid stenosis. Aortic Valve: Prior to procedure significant aortic stenosis without regurgitation. Mean gradient 30 mm Hg, Peak gradient 42 mm Hg, DVI 0.34, AVA 1.20 cm2. After procedure 34 mm Evolut Valve with mild to moderate paravalvular leak. After balloon angioplasty no aortic regurgitation (no PVL). Mean gradient 5 mm Hg, Peak gradient 10 mm Hg, EAO 3.68 cm2. Aortic valve mean gradient measures 6.5 mmHg. Aortic valve peak gradient measures 11.7 mmHg. Aortic valve area, by VTI  measures 6.30 cm. There is a 34 stented (  TAVR) valve present in the aortic position. Procedure Date: 02/07/2023. Echo findings are consistent with normal structure and function of the aortic valve prosthesis. Aorta: Aortic dilatation noted. Ascending aorta measurements are within normal limits for age when indexed to body surface area. There is mild dilatation of the ascending aorta, measuring 42 mm. Additional Comments: Spectral Doppler performed. Color Doppler performed.  LEFT VENTRICLE PLAX 2D LVIDd:         5.80 cm LVIDs:         4.30 cm LV PW:         1.40 cm LV IVS:        1.00 cm LVOT diam:     3.40 cm LV SV:         271 LV SV Index:   97 LVOT Area:     9.08 cm  LV Volumes (MOD) LV vol d, MOD A2C: 109.0 ml LV vol s, MOD A2C: 45.9 ml LV vol s, MOD A4C: 53.8 ml LV SV MOD A2C:     63.2 ml AORTIC VALVE AV Area (Vmax):    6.42 cm AV Area (Vmean):   6.59 cm AV Area (VTI):     6.30 cm AV Vmax:           171.00 cm/s AV Vmean:          114.000 cm/s AV VTI:            0.431 m AV Peak Grad:      11.7 mmHg AV Mean Grad:      6.5 mmHg LVOT Vmax:         121.00 cm/s LVOT Vmean:        82.800 cm/s LVOT VTI:          0.299 m LVOT/AV VTI ratio: 0.69  AORTA Ao Asc diam: 4.20 cm  SHUNTS Systemic VTI:  0.30 m Systemic Diam: 3.40 cm Riley Lam MD Electronically signed by Riley Lam MD Signature Date/Time: 02/07/2023/3:50:17 PM    Final    Structural Heart Procedure  Result Date: 02/07/2023 See surgical note for result.    Medications:     Scheduled Medications:  allopurinol  200 mg Oral BID   apixaban  5 mg Oral BID   ARIPiprazole  5 mg Oral QHS   atorvastatin  40 mg Oral Daily   busPIRone  10 mg Oral TID   diclofenac Sodium  4 g Topical QID   fenofibrate  160 mg Oral Daily   fluticasone furoate-vilanterol  1 puff Inhalation Daily   And   umeclidinium bromide  1 puff Inhalation Daily   insulin aspart  0-15 Units Subcutaneous TID WC   insulin aspart  0-5 Units Subcutaneous QHS   insulin  aspart  3 Units Subcutaneous TID WC   insulin glargine-yfgn  30 Units Subcutaneous Daily   lacosamide  150 mg Oral BID   levETIRAcetam  500 mg Oral BID   lidocaine  1 patch Transdermal Q24H   metoprolol succinate  50 mg Oral Daily   mirtazapine  15 mg Oral QHS   pantoprazole  40 mg Oral BID   prazosin  2 mg Oral QHS   pregabalin  150 mg Oral TID   sodium chloride flush  3 mL Intravenous Q12H   spironolactone  12.5 mg Oral Daily   traZODone  200 mg Oral QHS   venlafaxine XR  225 mg Oral Q breakfast    Infusions:  sodium chloride     sodium chloride  nitroGLYCERIN      PRN Medications: sodium chloride, acetaminophen **OR** acetaminophen, acetaminophen **OR** acetaminophen, hydrALAZINE, ipratropium-albuterol, melatonin, morphine injection, ondansetron (ZOFRAN) IV, oxyCODONE, senna-docusate, sodium chloride flush    Patient Profile  Lawrence [name] is a 67 y.o. male with history of chronic diastolic CHF, PAF, OSA, morbid obesity, DM II, recent diagnosis severe AS.   Admitted with acute hypoxic respiratory failure secondary to a/c diastolic CHF.  Planning for TAVR.  Assessment/Plan  1. Acute on chronic diastolic CHF: Echo (11/22) with EF 60-65%, mild LVH, mildly dilated LV, moderate aortic stenosis (trileaflet aortic valve) with mean gradient 22 mmHg, DI 0.27.  Echo in 10/23 with EF 60-65%, mild LV dilation, normal RV, moderate AS with mean gradient 28 and AVA around 1 cm^2. Echo 12/22/22: EF 60-65%, RV okay, severe AS with mean gradient of 40 mmHg and AVA 1.03 cm2.  - RHC 01/26/23: mRA 9, PA 34/15(24), mPCWP 14, Fick CO/CI 9.7/3.5 - S/p TAVR 07/02 - Given constellation of peripheral neuropathy, diastolic CHF and conduction system disease/atrial arrhythmias plan to screen for cardiac amyloidosis. Discussed with structural team. Lab panel ordered. Planning PYP scan as outpatient. - Admitted with NYHA IIIb/IV symptoms. He has been diuresed.  - He's had brisk diuresis with 20 mg  Torsemide daily but has been on 100 mg BID at home (confirmed with patient). Continue current dose. Supp K and Mag. - Restart Farxiga. - Continue spironolactone 12.5 mg daily. - BP variable. No room for ARNi. 2. Aortic stenosis: Moderate AS on TEE in 1/23 and echo in 10/23.  - Echo 05/24: EF 60-65%, severe AS with mean gradient of 40 mmHg and AVA 1.03 cm2. - Nonobstructive CAD on LHC 06/24 - Had multiple dentral extractions on 06/28 - S/p TAVR 07/02 with 34 mm Medtronic Evolut valve. No paravalvular leak and preserved LV function on echo post TAVR. - Repeat echo planned for today - New LBBB post TAVR. Structural team arranging for Live Zio at discharge. 3. Acute respiratory failure with hypoxia - 2/2 acute on chronic diastolic CHF +/- ? Component of OHS - Improved with diuresis 4. Atrial fibrillation: Paroxysmal. NSR today.  - Back on Eliquis 5 mg BID - Continue Toprol XL 5. OSA/?OHS: Using Bipap nightly.  6. Obesity: Body mass index is 46. - Continue tirzepatide as outpatient, he has not lost weight yet with this.  7. Hyperlipidemia: On 40 mg atorvastatin and vascepa.  - LDL 60 and triglycerides 239 in 04/24. - Continue current management. 8. Seizures  - On Keppra per Neurology  9. DM II: On insulin - A1c 6.4% 06/24 - Resume Farxiga  - Insulin per primary team 10. HTN: BP soft - Stopped diltiazem, keep off. Continue Toprol XL and spiro - Losartan not restarted on admit. No room for ARNi  Needs to mobilize.   Likely ready for discharge in next 24 hrs. Has f/u in HF clinic scheduled.  Length of Stay: 16  FINCH, LINDSAY N, PA-C  02/08/2023, 8:06 AM  Advanced Heart Failure Team Pager [phone] (M-F; 7a - 5p)  Please contact [facility] for night-coverage after hours (5p -7a ) and weekends on [email or web address]  Patient seen with PA, agree with the above note.   Successful TAVR procedure yesterday with Medtronic Evolut THV.  This morning, denies dyspnea.  ECG shows new left bundle  branch block and he has an ectopic atrial rhythm.   General: NAD Neck: JVP difficult, no thyromegaly or thyroid nodule.  Lungs: Decreased at bases.  CV: Nondisplaced PMI.  Heart regular S1/S2, no S3/S4, 2/6 SEM RUSB.  Chronic LE edema.  Abdomen: Soft, nontender, no hepatosplenomegaly, no distention.  Skin: Intact without lesions or rashes.  Neurologic: Alert and oriented x 3.  Psych: Normal affect. Extremities: No clubbing or cyanosis.  HEENT: Normal.   S/p TAVR, echo today to assess valve.    He has a new LBBB post-procedure (pre-op RBBB), will need Live Zio monitor at discharge.    Eliquis resumed for paroxysmal atrial fibrillation.  Rhythm today is not sinus, appears to be an ectopic atrial rhythm (not tachycardic).  Follow.   Volume difficult to determine.  He was on torsemide 100 mg bid at home but has diuresed reasonably here with lower torsemide.  I will start him back on torsemide 80 mg daily for now, may need higher dose.   Agree with workup for cardiac amyloidosis.  Will send myeloma labs here and arrange for PYP scan as outpatient.   Marca Ancona 02/08/2023 10:15 AM

## 2023-02-08 NOTE — Progress Notes (Addendum)
[facility]   MULTIDISCIPLINARY HEART VALVE TEAM  Patient Name: Lawrence [name] Date of Encounter: 02/08/2023  Admit date: 01/22/2023  Primary Care Provider: [name], PA-C [facility] Cardiologist: Armanda Magic, MD  [facility] Electrophysiologist:  Will Jorja Loa, MD   Hospital Problem List     Principal Problem:   Acute hypoxic respiratory failure [facility]) Active Problems:   OSA (obstructive sleep apnea)   Chest pain   Chronic diastolic CHF (congestive heart failure) (HCC)   Type 2 diabetes mellitus with hyperlipidemia (HCC)   Essential hypertension   Seizure disorder (HCC)   AF (paroxysmal atrial fibrillation) (HCC)   COPD (chronic obstructive pulmonary disease) (HCC)   S/P TAVR (transcatheter aortic valve replacement)     Subjective   No recurrence of chest pain since TAVR. Mostly complains of restless legs and neuropathic foot pain. Has been getting a lot of narcotics.   Inpatient Medications    Scheduled Meds:  allopurinol  200 mg Oral BID   apixaban  5 mg Oral BID   ARIPiprazole  5 mg Oral QHS   atorvastatin  40 mg Oral Daily   busPIRone  10 mg Oral TID   dapagliflozin propanediol  10 mg Oral Daily   diclofenac Sodium  4 g Topical QID   fenofibrate  160 mg Oral Daily   fluticasone furoate-vilanterol  1 puff Inhalation Daily   And   umeclidinium bromide  1 puff Inhalation Daily   insulin aspart  0-15 Units Subcutaneous TID WC   insulin aspart  0-5 Units Subcutaneous QHS   insulin aspart  3 Units Subcutaneous TID WC   insulin glargine-yfgn  30 Units Subcutaneous Daily   lacosamide  150 mg Oral BID   levETIRAcetam  500 mg Oral BID   lidocaine  1 patch Transdermal Q24H   metoprolol succinate  50 mg Oral Daily   mirtazapine  15 mg Oral QHS   pantoprazole  40 mg Oral BID   potassium chloride  40 mEq Oral Once   prazosin  2 mg Oral QHS   pregabalin  150 mg Oral TID   sodium chloride flush  3 mL Intravenous Q12H    spironolactone  12.5 mg Oral Daily   torsemide  20 mg Oral Daily   traZODone  200 mg Oral QHS   venlafaxine XR  225 mg Oral Q breakfast   Continuous Infusions:  sodium chloride     sodium chloride     magnesium sulfate bolus IVPB     PRN Meds: sodium chloride, acetaminophen **OR** acetaminophen, acetaminophen **OR** acetaminophen, hydrALAZINE, ipratropium-albuterol, melatonin, morphine injection, ondansetron (ZOFRAN) IV, oxyCODONE, senna-docusate, sodium chloride flush   Vital Signs    Vitals:   02/08/23 0300 02/08/23 0636 02/08/23 0655 02/08/23 0714  BP:   101/78 134/68  Pulse:   (!) 105 (!) 103  Resp:   18 18  Temp:   98.5 F (36.9 C) 98 F (36.7 C)  TempSrc:   Oral Oral  SpO2:   93% 93%  Weight: (!) 175.7 kg (!) 167.3 kg    Height:        Intake/Output Summary (Last 24 hours) at 02/08/2023 0858 Last data filed at 02/08/2023 0514 Gross per 24 hour  Intake 2555.17 ml  Output 2175 ml  Net 380.17 ml   Filed Weights   02/07/23 1049 02/08/23 0300 02/08/23 0636  Weight: (!) 163.3 kg (!) 175.7 kg (!) 167.3 kg    Physical Exam    GEN: obese  HEENT: Grossly normal.  Neck: Supple, no JVD, carotid bruits, or masses. Cardiac: irregular, no murmurs, rubs, or gallops. No clubbing, cyanosis, edema.   Respiratory:  Respirations regular and unlabored, clear to auscultation bilaterally. GI: Soft, nontender, nondistended, BS + x 4. MS: no deformity or atrophy. Skin: warm and dry, no rash.  Groin sites clear without hematoma or ecchymosis. Right bandage saturated with blood Neuro:  Strength and sensation are intact. Psych: AAOx3.  Normal affect.  Labs    CBC Recent Labs    02/07/23 0648 02/08/23 0053  WBC 9.2 14.2*  HGB 12.6* 11.9*  HCT 38.7* 37.2*  MCV 90.8 90.7  PLT 229 218   Basic Metabolic Panel Recent Labs    16/10/96 0648 02/08/23 0053  NA 138 136  K 3.8 3.6  CL 97* 102  CO2 26 27  GLUCOSE 153* 220*  BUN 23 14  CREATININE 0.86 0.95  CALCIUM 8.8* 8.3*  MG  2.0 1.8   Liver Function Tests Recent Labs    02/07/23 0648  AST 13*  ALT 18  ALKPHOS 52  BILITOT 0.8  PROT 5.9*  ALBUMIN 3.0*   No results for input(s): "LIPASE", "AMYLASE" in the last 72 hours. Cardiac Enzymes No results for input(s): "CKTOTAL", "CKMB", "CKMBINDEX", "TROPONINI" in the last 72 hours. BNP Invalid input(s): "POCBNP" D-Dimer No results for input(s): "DDIMER" in the last 72 hours. Hemoglobin A1C Recent Labs    02/07/23 0648  HGBA1C 6.4*   Fasting Lipid Panel No results for input(s): "CHOL", "HDL", "LDLCALC", "TRIG", "CHOLHDL", "LDLDIRECT" in the last 72 hours. Thyroid Function Tests No results for input(s): "TSH", "T4TOTAL", "T3FREE", "THYROIDAB" in the last 72 hours.  Invalid input(s): "FREET3"  Telemetry    Poor tracing. Leads replaced and looks like sinus - Personally Reviewed  ECG    Sinus with 1st deg AV block, LBBB, PVC. HR 96 - Personally Reviewed  Radiology    ECHOCARDIOGRAM LIMITED  Result Date: 02/07/2023    ECHOCARDIOGRAM LIMITED REPORT   Patient Name:   Lawrence [name] Date of Exam: 02/07/2023 Medical Rec #:  [id number]        Height:       74.0 in Accession #:    [id number]       Weight:       360.0 lb Date of Birth:  1955/10/01        BSA:          2.790 m Patient Age:    66 years         BP:           106/74 mmHg Patient Gender: M                HR:           72 bpm. Exam Location:  Inpatient Procedure: Cardiac Doppler, Color Doppler and Limited Echo Indications:     I35.0 Nonrheumatic aortic (valve) stenosis  History:         Patient has prior history of Echocardiogram examinations, most                  recent 12/22/2022. CHF, Abnormal ECG, COPD and Stroke, Aortic                  Valve Disease, Arrythmias:Atrial Fibrillation and SVT,                  Signs/Symptoms:Dyspnea and Shortness of Breath; Risk  Factors:Sleep Apnea, Hypertension, Diabetes and Dyslipidemia.                  Severe aortic stenosis.                  Aortic  Valve: 34 stented (TAVR) valve is present in the aortic                  position. Procedure Date: 02/07/2023.  Sonographer:     Sheralyn Boatman RDCS Referring Phys:  [id number] Wille Celeste THOMPSON Diagnosing Phys: Riley Lam MD  Sonographer Comments: Patient is obese. Image acquisition challenging due to patient body habitus. IMPRESSIONS  1. Prior to procedure significant aortic stenosis without regurgitation. Mean gradient 30 mm Hg, Peak gradient 42 mm Hg, DVI 0.34, AVA 1.20 cm2. After procedure 34 mm Evolut Valve with mild to moderate paravalvular leak. After balloon angioplasty no aortic regurgitation (no PVL). Mean gradient 5 mm Hg, Peak gradient 10 mm Hg, EAO 3.68 cm2. There is a 34 stented (TAVR) valve present in the aortic position. Procedure Date: 02/07/2023. Echo findings are consistent with normal structure and function of the aortic valve prosthesis.  2. Left ventricular ejection fraction, by estimation, is 55 to 60%. The left ventricle has normal function.  3. Right ventricular systolic function is normal.  4. Aortic dilatation noted. There is mild dilatation of the ascending aorta, measuring 42 mm.  5. Ascending aorta measurements are within normal limits for age when indexed to body surface area. FINDINGS  Left Ventricle: Left ventricular ejection fraction, by estimation, is 55 to 60%. The left ventricle has normal function. Right Ventricle: Right ventricular systolic function is normal. Tricuspid Valve: The tricuspid valve is normal in structure. Tricuspid valve regurgitation is mild . No evidence of tricuspid stenosis. Aortic Valve: Prior to procedure significant aortic stenosis without regurgitation. Mean gradient 30 mm Hg, Peak gradient 42 mm Hg, DVI 0.34, AVA 1.20 cm2. After procedure 34 mm Evolut Valve with mild to moderate paravalvular leak. After balloon angioplasty no aortic regurgitation (no PVL). Mean gradient 5 mm Hg, Peak gradient 10 mm Hg, EAO 3.68 cm2. Aortic valve mean gradient measures  6.5 mmHg. Aortic valve peak gradient measures 11.7 mmHg. Aortic valve area, by VTI measures 6.30 cm. There is a 34 stented (TAVR) valve present in the aortic position. Procedure Date: 02/07/2023. Echo findings are consistent with normal structure and function of the aortic valve prosthesis. Aorta: Aortic dilatation noted. Ascending aorta measurements are within normal limits for age when indexed to body surface area. There is mild dilatation of the ascending aorta, measuring 42 mm. Additional Comments: Spectral Doppler performed. Color Doppler performed.  LEFT VENTRICLE PLAX 2D LVIDd:         5.80 cm LVIDs:         4.30 cm LV PW:         1.40 cm LV IVS:        1.00 cm LVOT diam:     3.40 cm LV SV:         271 LV SV Index:   97 LVOT Area:     9.08 cm  LV Volumes (MOD) LV vol d, MOD A2C: 109.0 ml LV vol s, MOD A2C: 45.9 ml LV vol s, MOD A4C: 53.8 ml LV SV MOD A2C:     63.2 ml AORTIC VALVE AV Area (Vmax):    6.42 cm AV Area (Vmean):   6.59 cm AV Area (VTI):     6.30 cm AV Vmax:  171.00 cm/s AV Vmean:          114.000 cm/s AV VTI:            0.431 m AV Peak Grad:      11.7 mmHg AV Mean Grad:      6.5 mmHg LVOT Vmax:         121.00 cm/s LVOT Vmean:        82.800 cm/s LVOT VTI:          0.299 m LVOT/AV VTI ratio: 0.69  AORTA Ao Asc diam: 4.20 cm  SHUNTS Systemic VTI:  0.30 m Systemic Diam: 3.40 cm Riley Lam MD Electronically signed by Riley Lam MD Signature Date/Time: 02/07/2023/3:50:17 PM    Final    Structural Heart Procedure  Result Date: 02/07/2023 See surgical note for result.  DG Chest 2 View  Result Date: 02/06/2023 CLINICAL DATA:  Preoperative evaluation, initial encounter EXAM: CHEST - 2 VIEW COMPARISON:  01/22/2023 FINDINGS: Cardiac shadow is enlarged but stable. Lungs are well aerated bilaterally. No focal infiltrate or effusion is seen. No bony abnormality is noted. CardioMEMS device is noted. IMPRESSION: No acute abnormality noted. Electronically Signed   By: Alcide Clever  M.D.   On: 02/06/2023 22:04    Cardiac Studies   TAVR OPERATIVE NOTE     Date of Procedure:                02/07/2023    Preoperative Diagnosis:      Severe Aortic Stenosis    Postoperative Diagnosis:    Same    Procedure:        Transcatheter Aortic Valve Replacement - Percutaneous Transfemoral Approach             Medtronic Evolut FX (size 34 mm, serial # [id number] )              Co-Surgeons:                        Alleen Borne, MD and Alverda Skeans, MD   Anesthesiologist:                  Heather Roberts, MD   Echocardiographer:              Riley Lam, MD   Pre-operative Echo Findings: Severe aortic stenosis Normal left ventricular systolic function   Post-operative Echo Findings: No paravalvular leak Unchanged left ventricular systolic function   Left Heart Catheterization LVEDP  Patient Profile     MATHHEW DOODY is a 67 y.o. male with a history of non-obstructive CAD, chronic diastolic CHF, paroxysmal atrial fibrillation on Eliquis, HTN, HLD, morbid obesity (BMI 46), T2DM with peripheral neuropathy, COPD, OSA on CPAP, seizure disorder, morbid obesity with a BMI of 47, prior tobacco use and severe aortic stenosis who is currently admitted with chest pain and acute CHF. Structural heart team consulted for severe AS.   Assessment & Plan    Severe AS: s/p successful TAVR with a 34 mm Medtronic Evolut FX THV via the TF approach on 02/07/23. Post operative to be completed today. Groin sites are stable. Resumed on Eliquis 5mg  BID. Plan for discharge home with medically ready. We will see him in the office next week for follow up.   New LBBB: pre op ECG showed RBBB and 1st deg AV block. Plan to discharge with a Zio patch to rule out delayed HAVB.  PAF: tele looks irregular but tracing is quite  poor. Repeat 12 Lead shows sinus with 1st deg AV block and LBBB. Resume on Eliquis  Chronic diastolic CHF: being managed by the advanced CHF team. Resuming  GDMT  Poor dentition: s/p extractions by Dr Barbette Merino 6/28  Elevated Raise Score: with previous RBBB, many tracings with low voltage, neuropathy, afib and AS, will order amyloid screening labs while admitted and get PYP in the outpatient setting.    TAA: pre TAVR scans showed mild dilation of ascending aorta at 40mm. Recommended annual imaging by CTA/MRA. May not be significant when indexed for BSA. This will be followed over time.    Signed, Cline Crock, PA-C  02/08/2023, 8:58 AM  Pager [phone]   ATTENDING ATTESTATION:  After conducting a review of all available clinical information with the care team, interviewing the patient, and performing a physical exam, I agree with the findings and plan described in this note.   GEN: No acute distress.   HEENT:  MMM, no JVD, no scleral icterus; RIJ dressing in place Cardiac: RRR, no murmurs, rubs, or gallops.  Respiratory: Clear to auscultation bilaterally. GI: Soft, nontender, non-distended  MS: + 2 edema; No deformity. Neuro:  Nonfocal  Vasc:  +2 radial pulses; R femoral and L radial access sites intact  Patient doing well after uncomplicated TAVR with 34mm MDT from RTF approach.  EKG with new LBBB (ectopic atrial rhythm?).  Access sites stable and no signs of CVA.  TTE today.  Structural heart follow up in one week.  Will need Zio monitor on discharge re AF + new LBBB.  Discussed with Dr. Shirlee Latch.  Alverda Skeans, MD Pager [phone]

## 2023-02-08 NOTE — Progress Notes (Signed)
   02/08/23 0220  BiPAP/CPAP/SIPAP  Reason BIPAP/CPAP not in use Non-compliant

## 2023-02-08 NOTE — TOC Progression Note (Signed)
Transition of Care [facility]) - Progression Note    Patient Details  Name: CORDARIO BRAUDE MRN: [id number] Date of Birth: Nov 14, 1955  Transition of Care [facility]) CM/SW Contact  Elliot Cousin, RN Phone Number:  [phone] 02/08/2023, 4:18 PM  Clinical Narrative:  TOC CM spoke to pt and states he needs [facility] transportation home. Contacted [facility]. Contacted [facility] to make aware of dc home today.      Expected Discharge Plan: Home w Home Health Services Barriers to Discharge: No Barriers Identified  Expected Discharge Plan and Services In-house Referral: NA Discharge Planning Services: CM Consult Post Acute Care Choice: Home Health Living arrangements for the past 2 months: Apartment Expected Discharge Date: 02/08/23               DME Arranged: N/A         HH Arranged: RN, Disease Management, PT, OT HH Agency: [facility] Date HH Agency Contacted: 01/25/23 Time HH Agency Contacted: 1624 Representative spoke with at [facility] Agency: Tresa Endo   Social Determinants of Health (SDOH) Interventions SDOH Screenings   Food Insecurity: Food Insecurity Present (01/23/2023)  Housing: Medium Risk (01/23/2023)  Transportation Needs: No Transportation Needs (01/23/2023)  Utilities: Not At Risk (01/23/2023)  Alcohol Screen: Low Risk  (06/08/2021)  Financial Resource Strain: Medium Risk (06/08/2021)  Tobacco Use: Medium Risk (02/08/2023)    Readmission Risk Interventions    01/25/2023    4:23 PM 08/12/2021   10:14 AM 08/12/2021   10:12 AM  Readmission Risk Prevention Plan  Transportation Screening Complete Complete Complete  PCP or Specialist Appt within 3-5 Days Complete    HRI or Home Care Consult Complete    Social Work Consult for Recovery Care Planning/Counseling Complete    Palliative Care Screening Not Applicable    Medication Review Oceanographer) Referral to Pharmacy Complete Complete  PCP or Specialist appointment within 3-5 days of discharge  Complete   HRI or Home Care  Consult  Complete   SW Recovery Care/Counseling Consult  Complete   Palliative Care Screening  Not Applicable   Skilled Nursing Facility  Not Applicable

## 2023-02-08 NOTE — Progress Notes (Signed)
Okay for discharge today from HF perspective.   Zio monitor will placed prior to discharge.   He is scheduled for lab appointment on 07/08 at 8:45 AM and f/u in HF clinic 07/11 at 9:30 AM.  HF meds at discharge: Torsemide 80 mg BID (on 100 mg BID prior to admit) Cleda Daub 12.5 mg daily Farxiga 10 mg daily Toprol XL 50 mg daily Eliquis 5 mg BID Atorvastatin 40 mg daily Potassium chloride 40 BID Keep off metolazone, diltiazem and losartan until follow-up.

## 2023-02-08 NOTE — Progress Notes (Signed)
Okay for discharge today from HF perspective.   Zio monitor will placed prior to discharge.   He is scheduled for lab appointment on 07/08 at 8:45 AM and f/u in HF clinic 07/11 at 9:30 AM.  HF meds at discharge: Torsemide 80 mg BID (on 100 mg BID prior to admit) Spiro 12.5 mg daily Farxiga 10 mg daily Toprol XL 50 mg daily Eliquis 5 mg BID Atorvastatin 40 mg daily Potassium chloride 40 BID Keep off metolazone, diltiazem and losartan until follow-up. 

## 2023-02-08 NOTE — Plan of Care (Signed)
  Problem: Clinical Measurements: Goal: Respiratory complications will improve Outcome: Progressing Goal: Cardiovascular complication will be avoided Outcome: Progressing   Problem: Health Behavior/Discharge Planning: Goal: Ability to manage health-related needs will improve Outcome: Not Progressing   

## 2023-02-08 NOTE — Discharge Summary (Signed)
Physician Discharge Summary  Lawrence [name] UJW:[id number] DOB: 08/29/1955 DOA: 01/22/2023  PCP: [name], PA-C  Admit date: 01/22/2023 Discharge date: 02/08/2023  Time spent: 45 minutes  Recommendations for Outpatient Follow-up:  Advanced heart failure team in 1 week, please check BMP at follow-up Structural heart team in 1 month  Discharge Diagnoses:  Principal Problem:   Acute hypoxic respiratory failure (HCC)   Sp TAVR 7/2   Chest pain Acute on chronic diastolic CHF   OSA (obstructive sleep apnea)   Type 2 diabetes mellitus with hyperlipidemia (HCC)   Essential hypertension   Seizure disorder (HCC)   AF (paroxysmal atrial fibrillation) (HCC)   COPD (chronic obstructive pulmonary disease) (HCC)   S/P TAVR (transcatheter aortic valve replacement)   Discharge Condition: Improved  Diet recommendation: Low-sodium, heart healthy, diabetic  Filed Weights   02/07/23 1049 02/08/23 0300 02/08/23 0636  Weight: (!) 163.3 kg (!) 175.7 kg (!) 167.3 kg    History of present illness:  66/M with history of morbid obesity, chronic diastolic CHF, paroxysmal A-fib, sleep apnea, recent diagnosis of severe aortic stenosis was admitted with hypoxic respiratory failure secondary to volume overload, worsening AS   Hospital Course:   Acute hypoxic respiratory failure:  Most likely multifactorial secondary to volume overload due to severe aortic stenosis/dCHF/OSA/obesity hypoventilation syndrome.  CTA chest negative for PE.  Continue bronchodilators as needed.   -Wean O2 as tolerated, add IS -Volume status has improved significantly   Acute on chronic diastolic CHF -Echo 5/24 with EF 60-65%, preserved RV, severe aortic stenosis  -Right heart cath 01/26/2023 with PA 34/15, wedge of 14  -Diuresed significantly with IV Lasix, he is 31.7 L negative -Heart failure team consulting, continue torsemide, Aldactone, Farxiga -Torsemide dose changed to 80 Mg twice daily -Plan for amyloidosis  workup as outpatient -Follow-up next week in heart failure clinic   Severe aortic stenosis: -sp TAVR 7/2.   -Repeat echo with no apparent paravalvular leak and preserved EF -New LBBB noted, plan for Zio patch at discharge   COPD: Continue home inhalers.  Continue bronchodilators as needed.  Not on oxygen at home.   Diabetes type 2: Takes insulin at home.  Recent A1c of 6.4.   -CBGs elevated yesterday however did not get long-acting insulin as he was n.p.o.  -Continue Semglee and meal coverage insulin  -Restarting Farxiga   Paroxysmal A-fib: Currently in normal sinus rhythm.  -Cardizem discontinued, continue Toprol   Anxiety/depression: BuSpar, Abilify, venlafaxine   History of seizure disorder: On Keppra, lacosamide   History of OSA: On CPAP   Morbid obesity: BMI 47      Discharge Exam: Vitals:   02/08/23 0714 02/08/23 1112  BP: 134/68 (!) 131/95  Pulse: (!) 103 93  Resp: 18 17  Temp: 98 F (36.7 C) 99.4 F (37.4 C)  SpO2: 93% 91%   Gen: Awake, Alert, Oriented X 3,  HEENT: no JVD Lungs: Good air movement bilaterally, CTAB CVS: S1S2/RRR Abd: soft, Non tender, non distended, BS present Extremities: trace edema Skin: no new rashes on exposed skin   Discharge Instructions   Discharge Instructions     Diet - low sodium heart healthy   Complete by: As directed    Discharge wound care:   Complete by: As directed    routine   Increase activity slowly   Complete by: As directed       Allergies as of 02/08/2023       Reactions   Vancomycin Other (See Comments)   "  Red Man Syndrome" 02/02/17: possible cause for rash under both arms   Niacin And Related Other (See Comments)   Red man syndrome   Tubersol [tuberculin, Ppd] Other (See Comments)   Reaction unknown   Doxycycline Rash, Other (See Comments)        Medication List     STOP taking these medications    diltiazem 120 MG 24 hr capsule Commonly known as: CARDIZEM CD   losartan 25 MG  tablet Commonly known as: COZAAR   metolazone 2.5 MG tablet Commonly known as: ZAROXOLYN       TAKE these medications    acetaminophen 650 MG CR tablet Commonly known as: TYLENOL Take 1,300 mg by mouth every 8 (eight) hours as needed for pain.   albuterol 108 (90 Base) MCG/ACT inhaler Commonly known as: VENTOLIN HFA Inhale 2 puffs into the lungs every 6 (six) hours as needed for wheezing or shortness of breath.   allopurinol 100 MG tablet Commonly known as: ZYLOPRIM Take 200 mg by mouth 2 (two) times daily.   ARIPiprazole 5 MG tablet Commonly known as: ABILIFY TAKE ONE TABLET BY MOUTH EVERYDAY AT BEDTIME What changed:  how much to take how to take this when to take this additional instructions   atorvastatin 40 MG tablet Commonly known as: LIPITOR Take 1 tablet (40 mg total) by mouth daily.   B-D ULTRAFINE III SHORT PEN 31G X 8 MM Misc Generic drug: Insulin Pen Needle Inject 1 each into the skin 3 (three) times daily before meals. Use as instructed 4X daily.   busPIRone 10 MG tablet Commonly known as: BUSPAR Take 1 tablet (10 mg total) by mouth 3 (three) times daily.   cephALEXin 500 MG capsule Commonly known as: KEFLEX Take 1 capsule (500 mg total) by mouth 4 (four) times daily.   cetirizine 10 MG tablet Commonly known as: ZYRTEC Take 10 mg by mouth at bedtime.   dapagliflozin propanediol 10 MG Tabs tablet Commonly known as: Farxiga TAKE ONE TABLET BY MOUTH BEFORE BREAKFAST What changed:  how much to take how to take this when to take this additional instructions   diclofenac Sodium 1 % Gel Commonly known as: VOLTAREN Apply 4 g topically 4 (four) times daily. To right knee.   Eliquis 5 MG Tabs tablet Generic drug: apixaban TAKE ONE TABLET BY MOUTH TWICE DAILY What changed: how much to take   fenofibrate 145 MG tablet Commonly known as: Tricor Take 1 tablet (145 mg total) by mouth daily.   ferrous sulfate 324 (65 Fe) MG Tbec Take 324 mg by  mouth daily with breakfast.   fluticasone 50 MCG/ACT nasal spray Commonly known as: FLONASE Place 2 sprays into both nostrils daily.   Lacosamide 150 MG Tabs Take 1 tablet (150 mg total) by mouth 2 (two) times daily.   levETIRAcetam 500 MG tablet Commonly known as: KEPPRA Take 1 tablet (500 mg total) by mouth 2 (two) times daily.   metFORMIN 500 MG 24 hr tablet Commonly known as: GLUCOPHAGE-XR Take 4 tablets (2,000 mg total) by mouth daily.   metoprolol succinate 50 MG 24 hr tablet Commonly known as: TOPROL-XL Take 1 tablet (50 mg total) by mouth daily. What changed:  medication strength how much to take   mirtazapine 15 MG tablet Commonly known as: REMERON TAKE ONE TABLET BY MOUTH EVERYDAY AT BEDTIME What changed: See the new instructions.   NovoLOG FlexPen 100 UNIT/ML FlexPen Generic drug: insulin aspart INJECT 12 UNITS BEFORE MEALS, keep sugar TWO HOURS  AFTER meals UNDER 180 AT least What changed: additional instructions   omeprazole 20 MG capsule Commonly known as: PRILOSEC Take 20 mg by mouth every morning.   potassium chloride SA 20 MEQ tablet Commonly known as: KLOR-CON M Take 2 tablets (40 mEq total) by mouth 2 (two) times daily. What changed: additional instructions   prazosin 2 MG capsule Commonly known as: MINIPRESS Take 1 capsule (2 mg total) by mouth at bedtime.   pregabalin 150 MG capsule Commonly known as: LYRICA Take 1 capsule (150 mg total) by mouth 3 (three) times daily.   repaglinide 2 MG tablet Commonly known as: PRANDIN TAKE TWO TABLETS BY MOUTH TWICE DAILY BEFORE A meal What changed: See the new instructions.   spironolactone 25 MG tablet Commonly known as: ALDACTONE Take 0.5 tablets (12.5 mg total) by mouth daily.   tirzepatide 12.5 MG/0.5ML Pen Commonly known as: MOUNJARO Inject 12.5 mg into the skin once a week.   torsemide 20 MG tablet Commonly known as: DEMADEX Take 4 tablets (80 mg total) by mouth 2 (two) times daily. What  changed: how much to take   traZODone 100 MG tablet Commonly known as: DESYREL Take 200 mg by mouth at bedtime.   Trelegy Ellipta 200-62.5-25 MCG/ACT Aepb Generic drug: Fluticasone-Umeclidin-Vilant INHALE 1 PUFF BY MOUTH INTO LUNGS ONCE DAILY What changed:  how much to take how to take this when to take this additional instructions   Tresiba FlexTouch 100 UNIT/ML FlexTouch Pen Generic drug: insulin degludec INJECT 40 UNITS into THE SKIN ONCE DAILY What changed: See the new instructions.   Vascepa 1 g capsule Generic drug: icosapent Ethyl TAKE TWO CAPSULES BY MOUTH TWICE DAILY   venlafaxine XR 75 MG 24 hr capsule Commonly known as: EFFEXOR-XR Take 3 capsules (225 mg total) by mouth daily with breakfast.   VITAMIN D3 PO Take 2,000 Units by mouth daily.               Discharge Care Instructions  (From admission, onward)           Start     Ordered   02/08/23 0000  Discharge wound care:       Comments: routine   02/08/23 1419           Allergies  Allergen Reactions   Vancomycin Other (See Comments)    "Red Man Syndrome" 02/02/17: possible cause for rash under both arms   Niacin And Related Other (See Comments)    Red man syndrome   Tubersol [Tuberculin, Ppd] Other (See Comments)    Reaction unknown   Doxycycline Rash and Other (See Comments)    Follow-up Information     [facility] Follow up.   Specialty: Home Health Services Why: Registered Nurse, Physical and Occupational Therapy-office to call with visit times. Contact information: [address] [place] [address] [phone]         Janetta Hora, PA-C Follow up on 02/15/2023.   Specialties: Cardiology, Radiology Why: @ 2:45pm. Please arrive at 2:30pm Contact information: [address] [place] [address] [phone]         [facility] Follow up on 02/16/2023.   Specialty:  Cardiology Why: [facility] 9:30 AM Entrance C, Free Valet Parking Contact information: [address] [id number] [place] [address] [phone]                 The results of  significant diagnostics from this hospitalization (including imaging, microbiology, ancillary and laboratory) are listed below for reference.    Significant Diagnostic Studies: ECHOCARDIOGRAM LIMITED  Result Date: 02/07/2023    ECHOCARDIOGRAM LIMITED REPORT   Patient Name:   Lawrence [name] Date of Exam: 02/07/2023 Medical Rec #:  [id number]        Height:       74.0 in Accession #:    [id number]       Weight:       360.0 lb Date of Birth:  1955-12-08        BSA:          2.790 m Patient Age:    66 years         BP:           106/74 mmHg Patient Gender: M                HR:           72 bpm. Exam Location:  Inpatient Procedure: Cardiac Doppler, Color Doppler and Limited Echo Indications:     I35.0 Nonrheumatic aortic (valve) stenosis  History:         Patient has prior history of Echocardiogram examinations, most                  recent 12/22/2022. CHF, Abnormal ECG, COPD and Stroke, Aortic                  Valve Disease, Arrythmias:Atrial Fibrillation and SVT,                  Signs/Symptoms:Dyspnea and Shortness of Breath; Risk                  Factors:Sleep Apnea, Hypertension, Diabetes and Dyslipidemia.                  Severe aortic stenosis.                  Aortic Valve: 34 stented (TAVR) valve is present in the aortic                  position. Procedure Date: 02/07/2023.  Sonographer:     Sheralyn Boatman RDCS Referring Phys:  [id number] Wille Celeste THOMPSON Diagnosing Phys: Riley Lam MD  Sonographer Comments: Patient is obese. Image acquisition challenging due to patient body habitus. IMPRESSIONS  1. Prior to procedure significant aortic stenosis without regurgitation. Mean gradient 30 mm Hg, Peak gradient 42 mm Hg, DVI 0.34, AVA 1.20 cm2. After procedure 34 mm Evolut Valve  with mild to moderate paravalvular leak. After balloon angioplasty no aortic regurgitation (no PVL). Mean gradient 5 mm Hg, Peak gradient 10 mm Hg, EAO 3.68 cm2. There is a 34 stented (TAVR) valve present in the aortic position. Procedure Date: 02/07/2023. Echo findings are consistent with normal structure and function of the aortic valve prosthesis.  2. Left ventricular ejection fraction, by estimation, is 55 to 60%. The left ventricle has normal function.  3. Right ventricular systolic function is normal.  4. Aortic dilatation noted. There is mild dilatation of the ascending aorta, measuring 42 mm.  5. Ascending aorta measurements are within normal limits for age when indexed to body surface area. FINDINGS  Left Ventricle: Left ventricular ejection fraction, by estimation, is 55 to 60%. The left ventricle has normal function. Right Ventricle: Right ventricular systolic function is normal. Tricuspid Valve: The tricuspid valve is normal in structure. Tricuspid valve regurgitation is  mild . No evidence of tricuspid stenosis. Aortic Valve: Prior to procedure significant aortic stenosis without regurgitation. Mean gradient 30 mm Hg, Peak gradient 42 mm Hg, DVI 0.34, AVA 1.20 cm2. After procedure 34 mm Evolut Valve with mild to moderate paravalvular leak. After balloon angioplasty no aortic regurgitation (no PVL). Mean gradient 5 mm Hg, Peak gradient 10 mm Hg, EAO 3.68 cm2. Aortic valve mean gradient measures 6.5 mmHg. Aortic valve peak gradient measures 11.7 mmHg. Aortic valve area, by VTI measures 6.30 cm. There is a 34 stented (TAVR) valve present in the aortic position. Procedure Date: 02/07/2023. Echo findings are consistent with normal structure and function of the aortic valve prosthesis. Aorta: Aortic dilatation noted. Ascending aorta measurements are within normal limits for age when indexed to body surface area. There is mild dilatation of the ascending aorta, measuring 42 mm. Additional Comments: Spectral  Doppler performed. Color Doppler performed.  LEFT VENTRICLE PLAX 2D LVIDd:         5.80 cm LVIDs:         4.30 cm LV PW:         1.40 cm LV IVS:        1.00 cm LVOT diam:     3.40 cm LV SV:         271 LV SV Index:   97 LVOT Area:     9.08 cm  LV Volumes (MOD) LV vol d, MOD A2C: 109.0 ml LV vol s, MOD A2C: 45.9 ml LV vol s, MOD A4C: 53.8 ml LV SV MOD A2C:     63.2 ml AORTIC VALVE AV Area (Vmax):    6.42 cm AV Area (Vmean):   6.59 cm AV Area (VTI):     6.30 cm AV Vmax:           171.00 cm/s AV Vmean:          114.000 cm/s AV VTI:            0.431 m AV Peak Grad:      11.7 mmHg AV Mean Grad:      6.5 mmHg LVOT Vmax:         121.00 cm/s LVOT Vmean:        82.800 cm/s LVOT VTI:          0.299 m LVOT/AV VTI ratio: 0.69  AORTA Ao Asc diam: 4.20 cm  SHUNTS Systemic VTI:  0.30 m Systemic Diam: 3.40 cm Riley Lam MD Electronically signed by Riley Lam MD Signature Date/Time: 02/07/2023/3:50:17 PM    Final    Structural Heart Procedure  Result Date: 02/07/2023 See surgical note for result.  DG Chest 2 View  Result Date: 02/06/2023 CLINICAL DATA:  Preoperative evaluation, initial encounter EXAM: CHEST - 2 VIEW COMPARISON:  01/22/2023 FINDINGS: Cardiac shadow is enlarged but stable. Lungs are well aerated bilaterally. No focal infiltrate or effusion is seen. No bony abnormality is noted. CardioMEMS device is noted. IMPRESSION: No acute abnormality noted. Electronically Signed   By: Alcide Clever M.D.   On: 02/06/2023 22:04   CT CORONARY MORPH W/CTA COR W/SCORE W/CA W/CM &/OR WO/CM  Addendum Date: 01/30/2023   ADDENDUM REPORT: 01/30/2023 14:10 CLINICAL DATA:  36M with severe aortic stenosis being evaluated for a TAVR procedure. EXAM: Cardiac TAVR CT TECHNIQUE: The patient was scanned on a [employer]. A 120 kV retrospective scan was triggered in the descending thoracic aorta at 111 HU's. Gantry rotation speed was 250 msecs and collimation was .6 mm. No beta  blockade or nitro were given.  The 3D data set was reconstructed in 5% intervals of the R-R cycle. Systolic and diastolic phases were analyzed on a dedicated work station using MPR, MIP and VRT modes. The patient received 100 cc of contrast. FINDINGS: Aortic Root: Aortic valve: Trileaflet Aortic annulus: Diameter: 29mm x 27mm Perimeter: 92mm Area: 657 mm^2 Calcifications: No calcifications Coronary height: Min Left - 16mm, Min Right - 17mm Sinotubular height: Left cusp - 24mm; Right cusp - 27mm; Noncoronary cusp - 29mm LVOT (as measured 3 mm below the annulus): Diameter: 30mm x 27mm Area: 735 mm^2 Calcifications: No calcifications Aortic sinus width: Left cusp - 37mm; Right cusp - 37mm; Noncoronary cusp - 37mm Sinotubular junction width: 34mm x 33mm Optimum Fluoroscopic Angle for Delivery: RAO 5 CRA 10 IMPRESSION: 1. Poor contrast opacification, which affects accuracy of TAVR measurements 2.  Tricuspid aortic valve with severe calcifications 3. Aortic annulus measures 29mm x 27mm in diameter with perimeter 92mm and area 657 mm^2. No annular or LVOT calcifications. Annular measurements are suitable for placement of 29mm Sapien 3 valve 4. Sufficient coronary to annulus distance. 5. Optimum Fluoroscopic Angle for Delivery:  RAO 5 CRA 10 6. See prior study on 01/27/23 for aortic valve and coronary calcium scores Electronically Signed   By: Epifanio Lesches M.D.   On: 01/30/2023 14:10   Result Date: 01/30/2023 EXAM: OVER-READ INTERPRETATION  CT CHEST The following report is a limited chest CT over-read performed by radiologist Dr. Duanne Guess of [facility] on 01/30/2023. This over-read does not include interpretation of cardiac or coronary anatomy or pathology. The coronary CTA interpretation by the cardiologist is attached. COMPARISON:  01/27/2023 FINDINGS: Stable heart size. No pericardial effusion. Image thoracic aorta is nonaneurysmal. Aortic and coronary artery atherosclerosis. Central pulmonary vasculature within normal  limits. Mildly enlarged subcarinal and bilateral hilar lymph nodes, unchanged. Atelectatic changes within the dependent lung fields. Imaged lung fields are otherwise clear. No acute bony or chest wall abnormality. IMPRESSION: 1. No new or acute extracardiac findings. 2. Mildly enlarged subcarinal and bilateral hilar lymph nodes, unchanged. 3. Aortic and coronary artery atherosclerosis (ICD10-I70.0). Electronically Signed: By: Duanne Guess D.O. On: 01/30/2023 13:19   CT Angio Abd/Pel w/ and/or w/o  Result Date: 01/28/2023 CLINICAL DATA:  Inpatient. Aortic valve replacement (TAVR), pre-op eval. Aortic stenosis. EXAM: CT ANGIOGRAPHY CHEST, ABDOMEN AND PELVIS TECHNIQUE: Multidetector CT imaging through the chest, abdomen and pelvis was performed using the standard protocol during bolus administration of intravenous contrast. Multiplanar reconstructed images and MIPs were obtained and reviewed to evaluate the vascular anatomy. RADIATION DOSE REDUCTION: This exam was performed according to the departmental dose-optimization program which includes automated exposure control, adjustment of the mA and/or kV according to patient size and/or use of iterative reconstruction technique. CONTRAST:  OMNIPAQUE IOHEXOL 350 MG/ML SOLN COMPARISON:  01/23/2023 and 10/26/2022 chest CT angiogram. 02/04/2017 CT pelvis. FINDINGS: CTA CHEST FINDINGS Cardiovascular: Normal heart size. No significant pericardial effusion/thickening. Diffuse thickening and coarse calcification of the aortic valve. Three-vessel coronary atherosclerosis. Atherosclerotic thoracic aorta with dilated 4.0 cm ascending thoracic aorta. Normal caliber pulmonary arteries. No central pulmonary emboli. Mediastinum/Nodes: No significant thyroid nodules. Unremarkable esophagus. No axillary adenopathy. Mildly enlarged 1.1 cm subcarinal node (series 4/image 77), not definitely changed from 10/26/2022 CT. No additional pathologically enlarged mediastinal nodes.  Mildly enlarged 1.2 cm right hilar node (series 4/image 70) and 1.0 cm left hilar node (series 4/image 82), unchanged. Lungs/Pleura: No pneumothorax. No pleural effusion. Mild paraseptal and centrilobular emphysema  with mild diffuse bronchial wall thickening. No acute consolidative airspace disease or lung masses. No central airway stenoses. Solid 0.5 cm posterior right upper lobe pulmonary nodule (series 5/image 90), stable since at least 09/09/2019 chest CT and considered benign. No new significant pulmonary nodules. Mild hypoventilatory changes in the lower lobes. Musculoskeletal: No aggressive appearing focal osseous lesions. Moderate thoracic spondylosis. CTA ABDOMEN AND PELVIS FINDINGS Hepatobiliary: Normal liver size. Simple 3.8 cm anterior right liver cyst. No additional liver lesions. Normal gallbladder with no radiopaque cholelithiasis. No biliary ductal dilatation. Pancreas: Normal, with no mass or duct dilation. Spleen: Normal size. No mass. Adrenals/Urinary Tract: Normal adrenals. Several scattered simple right liver cysts, largest 2.3 cm in the posterior interpolar right kidney, for which no follow-up imaging is recommended. No contour deforming left renal masses. No hydronephrosis. Normal bladder. Stomach/Bowel: Normal non-distended stomach. Normal caliber small bowel with no small bowel wall thickening. Normal appendix. Moderate left colonic diverticulosis with no large bowel wall thickening or significant pericolonic fat stranding. Vascular/Lymphatic: Atherosclerotic nonaneurysmal abdominal aorta. No pathologically enlarged lymph nodes in the abdomen or pelvis. Reproductive: Normal size prostate. Other: No pneumoperitoneum, ascites or focal fluid collection. Musculoskeletal: No aggressive appearing focal osseous lesions. Right low back spinal stimulator with leads terminating in the posterior lower thoracic spinal canal. Mild lumbar spondylosis. VASCULAR MEASUREMENTS PERTINENT TO TAVR: AORTA:  Minimal Aortic Diameter-15.2 x 12.5 mm Severity of Aortic Calcification-moderate RIGHT PELVIS: Right Common Iliac Artery - Minimal Diameter-12.7 x 11.5 mm Tortuosity-mild-to-moderate Calcification-mild Right External Iliac Artery - Minimal Diameter-9.9 x 8.6 mm Tortuosity-moderate Calcification-mild Right Common Femoral Artery - Minimal Diameter-9.8 x 8.0 mm Tortuosity-mild Calcification-moderate LEFT PELVIS: Left Common Iliac Artery - Minimal Diameter-12.0 x 11.6 mm Tortuosity-mild Calcification-moderate Left External Iliac Artery - Minimal Diameter-10.7 x 10.4 mm Tortuosity-moderate Calcification-mild Left Common Femoral Artery - Minimal Diameter-10.4 x 8.3 mm Tortuosity-mild Calcification-moderate to severe Review of the MIP images confirms the above findings. IMPRESSION: 1. Vascular findings and measurements pertinent to potential TAVR procedure, as detailed. 2. Diffuse thickening and coarse calcification of the aortic valve, compatible with the reported history of aortic stenosis. 3. Dilated 4.0 cm ascending thoracic aorta. Recommend annual imaging followup by CTA or MRA. This recommendation follows 2010 ACCF/AHA/AATS/ACR/ASA/SCA/SCAI/SIR/STS/SVM Guidelines for the Diagnosis and Management of Patients with Thoracic Aortic Disease. Circulation. 2010; 121: Z610-R604. Aortic aneurysm NOS (ICD10-I71.9) 4. Three-vessel coronary atherosclerosis. 5. Nonspecific mild mediastinal and bilateral hilar lymphadenopathy, not definitely changed, most likely benign reactive adenopathy. 6. Moderate left colonic diverticulosis. 7.  Emphysema (ICD10-J43.9). Electronically Signed   By: Delbert Phenix M.D.   On: 01/28/2023 12:03   CT ANGIO CHEST AORTA W/CM & OR WO/CM  Result Date: 01/28/2023 CLINICAL DATA:  Inpatient. Aortic valve replacement (TAVR), pre-op eval. Aortic stenosis. EXAM: CT ANGIOGRAPHY CHEST, ABDOMEN AND PELVIS TECHNIQUE: Multidetector CT imaging through the chest, abdomen and pelvis was performed using the  standard protocol during bolus administration of intravenous contrast. Multiplanar reconstructed images and MIPs were obtained and reviewed to evaluate the vascular anatomy. RADIATION DOSE REDUCTION: This exam was performed according to the departmental dose-optimization program which includes automated exposure control, adjustment of the mA and/or kV according to patient size and/or use of iterative reconstruction technique. CONTRAST:  OMNIPAQUE IOHEXOL 350 MG/ML SOLN COMPARISON:  01/23/2023 and 10/26/2022 chest CT angiogram. 02/04/2017 CT pelvis. FINDINGS: CTA CHEST FINDINGS Cardiovascular: Normal heart size. No significant pericardial effusion/thickening. Diffuse thickening and coarse calcification of the aortic valve. Three-vessel coronary atherosclerosis. Atherosclerotic thoracic aorta with dilated 4.0 cm ascending thoracic aorta. Normal  caliber pulmonary arteries. No central pulmonary emboli. Mediastinum/Nodes: No significant thyroid nodules. Unremarkable esophagus. No axillary adenopathy. Mildly enlarged 1.1 cm subcarinal node (series 4/image 77), not definitely changed from 10/26/2022 CT. No additional pathologically enlarged mediastinal nodes. Mildly enlarged 1.2 cm right hilar node (series 4/image 70) and 1.0 cm left hilar node (series 4/image 82), unchanged. Lungs/Pleura: No pneumothorax. No pleural effusion. Mild paraseptal and centrilobular emphysema with mild diffuse bronchial wall thickening. No acute consolidative airspace disease or lung masses. No central airway stenoses. Solid 0.5 cm posterior right upper lobe pulmonary nodule (series 5/image 90), stable since at least 09/09/2019 chest CT and considered benign. No new significant pulmonary nodules. Mild hypoventilatory changes in the lower lobes. Musculoskeletal: No aggressive appearing focal osseous lesions. Moderate thoracic spondylosis. CTA ABDOMEN AND PELVIS FINDINGS Hepatobiliary: Normal liver size. Simple 3.8 cm anterior right liver  cyst. No additional liver lesions. Normal gallbladder with no radiopaque cholelithiasis. No biliary ductal dilatation. Pancreas: Normal, with no mass or duct dilation. Spleen: Normal size. No mass. Adrenals/Urinary Tract: Normal adrenals. Several scattered simple right liver cysts, largest 2.3 cm in the posterior interpolar right kidney, for which no follow-up imaging is recommended. No contour deforming left renal masses. No hydronephrosis. Normal bladder. Stomach/Bowel: Normal non-distended stomach. Normal caliber small bowel with no small bowel wall thickening. Normal appendix. Moderate left colonic diverticulosis with no large bowel wall thickening or significant pericolonic fat stranding. Vascular/Lymphatic: Atherosclerotic nonaneurysmal abdominal aorta. No pathologically enlarged lymph nodes in the abdomen or pelvis. Reproductive: Normal size prostate. Other: No pneumoperitoneum, ascites or focal fluid collection. Musculoskeletal: No aggressive appearing focal osseous lesions. Right low back spinal stimulator with leads terminating in the posterior lower thoracic spinal canal. Mild lumbar spondylosis. VASCULAR MEASUREMENTS PERTINENT TO TAVR: AORTA: Minimal Aortic Diameter-15.2 x 12.5 mm Severity of Aortic Calcification-moderate RIGHT PELVIS: Right Common Iliac Artery - Minimal Diameter-12.7 x 11.5 mm Tortuosity-mild-to-moderate Calcification-mild Right External Iliac Artery - Minimal Diameter-9.9 x 8.6 mm Tortuosity-moderate Calcification-mild Right Common Femoral Artery - Minimal Diameter-9.8 x 8.0 mm Tortuosity-mild Calcification-moderate LEFT PELVIS: Left Common Iliac Artery - Minimal Diameter-12.0 x 11.6 mm Tortuosity-mild Calcification-moderate Left External Iliac Artery - Minimal Diameter-10.7 x 10.4 mm Tortuosity-moderate Calcification-mild Left Common Femoral Artery - Minimal Diameter-10.4 x 8.3 mm Tortuosity-mild Calcification-moderate to severe Review of the MIP images confirms the above findings.  IMPRESSION: 1. Vascular findings and measurements pertinent to potential TAVR procedure, as detailed. 2. Diffuse thickening and coarse calcification of the aortic valve, compatible with the reported history of aortic stenosis. 3. Dilated 4.0 cm ascending thoracic aorta. Recommend annual imaging followup by CTA or MRA. This recommendation follows 2010 ACCF/AHA/AATS/ACR/ASA/SCA/SCAI/SIR/STS/SVM Guidelines for the Diagnosis and Management of Patients with Thoracic Aortic Disease. Circulation. 2010; 121: U981-X914. Aortic aneurysm NOS (ICD10-I71.9) 4. Three-vessel coronary atherosclerosis. 5. Nonspecific mild mediastinal and bilateral hilar lymphadenopathy, not definitely changed, most likely benign reactive adenopathy. 6. Moderate left colonic diverticulosis. 7.  Emphysema (ICD10-J43.9). Electronically Signed   By: Delbert Phenix M.D.   On: 01/28/2023 12:03   CT CORONARY MORPH W/CTA COR W/SCORE W/CA W/CM &/OR WO/CM  Addendum Date: 01/28/2023   ADDENDUM REPORT: 01/28/2023 11:30 EXAM: OVER-READ INTERPRETATION  CT CHEST The following report is an over-read performed by radiologist Dr. Lesia Hausen [facility] on 01/28/2023. This over-read does not include interpretation of cardiac or coronary anatomy or pathology. The cardiac CTA interpretation by the cardiologist is attached. COMPARISON:  10/26/2022 chest CT angiogram. FINDINGS: Please see the separate concurrent chest CT angiogram report for details. IMPRESSION: Please see  the separate concurrent chest CT angiogram report for details. Electronically Signed   By: Delbert Phenix M.D.   On: 01/28/2023 11:30   Result Date: 01/28/2023 CLINICAL DATA:  69M with severe aortic stenosis being evaluated for a TAVR procedure. EXAM: Cardiac TAVR CT TECHNIQUE: The patient was scanned on a [employer]. A 120 kV retrospective scan was triggered in the descending thoracic aorta at 111 HU's. Gantry rotation speed was 250 msecs and collimation was .6 mm. No beta  blockade or nitro were given. The 3D data set was reconstructed in 5% intervals of the R-R cycle. Systolic and diastolic phases were analyzed on a dedicated work station using MPR, MIP and VRT modes. The patient received 100 cc of contrast. FINDINGS: Aortic valve: Trileaflet. Poor contrast opacification, unable to make TAVR measurements Aortic valve calcium score: 3685 Cardiac: Right atrium: Mild enlargement. Lipomatous hypertrophy of interatrial septum Right ventricle: Mild enlargement Pulmonary arteries: Normal size Pulmonary veins: Normal configuration Left atrium: Mild enlargement Left ventricle: Normal size Pericardium: Normal thickness Coronary arteries: Calcium score 4290 (99th percentile) IMPRESSION: 1. During contrast injection, IV pressure limit was reached, resulting in low contrast injection rate and very poor contrast opacification. Contrast is not sufficient to make TAVR measurements. Recommend repeating study. 2. Tricuspid aortic valve with severe calcifications (AV calcium score 3685) 3.  Coronary calcium score 4290 (99th percentile) Electronically Signed: By: Epifanio Lesches M.D. On: 01/27/2023 17:48   CT MAXILLOFACIAL WO CONTRAST  Result Date: 01/27/2023 CLINICAL DATA:  TMJ pain or limited movement.  Poor dentition. EXAM: CT MAXILLOFACIAL WITHOUT CONTRAST TECHNIQUE: Multidetector CT imaging of the maxillofacial structures was performed. Multiplanar CT image reconstructions were also generated. RADIATION DOSE REDUCTION: This exam was performed according to the departmental dose-optimization program which includes automated exposure control, adjustment of the mA and/or kV according to patient size and/or use of iterative reconstruction technique. COMPARISON:  Head and neck CTA 05/25/2022 FINDINGS: Osseous: Poor dentition with numerous dental caries including prominent involvement of mandibular and maxillary molars bilaterally where there are associated periapical erosions. No acute  inflammatory changes are identified in the overlying soft tissues. No acute fracture. Orbits: Unremarkable. Sinuses: Mild-to-moderate mucosal thickening in the paranasal sinuses, greatest in the ethmoid air cells bilaterally. Minimal fluid in the sphenoid sinuses. Clear mastoid air cells. Soft tissues: Unremarkable. Limited intracranial: Unremarkable. IMPRESSION: Poor dentition with numerous dental caries and periapical erosions. No evidence of overlying acute soft tissue inflammation/infection. Electronically Signed   By: Sebastian Ache M.D.   On: 01/27/2023 16:50   CARDIAC CATHETERIZATION  Result Date: 01/26/2023   Prox LAD to Mid LAD lesion is 40% stenosed.   Prox Cx to Mid Cx lesion is 20% stenosed.   Ost 2nd Mrg lesion is 20% stenosed.   Mid RCA lesion is 40% stenosed.   Prox Cx lesion is 20% stenosed. 1.  Mild to moderate diffuse obstructive coronary artery disease without high-grade obstructions. 2.  Fick cardiac output of 9.7 L/min and Fick cardiac index of 3.5 L/min/m with the following hemodynamics:  Right atrial pressure mean of 9 mmHg  Right ventricular pressure 36/4 with an end-diastolic pressure of 16 mmHg  Mean wedge pressure of 14 mmHg  Pulmonary artery pressure of 34/15 with a mean of 24 mmHg Recommendation: Continue evaluation for aortic valve intervention.   CT Angio Chest PE W/Cm &/Or Wo Cm  Result Date: 01/23/2023 CLINICAL DATA:  Chest pain or shortness breath. EXAM: CT ANGIOGRAPHY CHEST WITH CONTRAST TECHNIQUE: Multidetector CT imaging of the chest  was performed using the standard protocol during bolus administration of intravenous contrast. Multiplanar CT image reconstructions and MIPs were obtained to evaluate the vascular anatomy. RADIATION DOSE REDUCTION: This exam was performed according to the departmental dose-optimization program which includes automated exposure control, adjustment of the mA and/or kV according to patient size and/or use of iterative reconstruction technique.  CONTRAST:  75mL OMNIPAQUE IOHEXOL 350 MG/ML SOLN COMPARISON:  AP and lateral chest yesterday, CTA chest 10/26/2022, CTA chest 09/08/2018. FINDINGS: Cardiovascular: There is mild-to-moderate cardiomegaly with a left chamber predominance. Pulmonary veins are normal in caliber. There are three-vessel coronary artery calcifications in the proximal LAD stenting again noted. There is lipomatous infiltration of the inter-atrial septum. The pulmonary trunk prominent but stable measuring 3 cm. There is IVC and hepatic vein reflux which may suggest tricuspid regurgitation or elevated right heart pressures but there is no enlargement of the right ventricle compared to the left. No arterial embolic filling defect is seen. There is a linear calcification within a segmental artery to the posterior basal left lower lobe consistent with a chronic linear calcified thrombus. There are moderate to heavy calcifications in the aortic valve leaflets, mild aortic tortuosity and mild aortic and great vessel atherosclerosis. There is no aortic aneurysm. There is no great vessel or aortic stenosis or dissection. There is a normal variant brachiobicarotid trunk. Mediastinum/Nodes: There are no thyroid nodules or axillary adenopathy. Single mildly prominent azygoesophageal recess node measures 1.4 cm and is unchanged. There are scattered bilateral slightly prominent hilar lymph nodes, largest on the right is 1.1 cm in the posterior mid hilum, largest on the left is 1 cm short axis in the lower hilum. There is no new, further or enlarging adenopathy. There is lipomatosis of the superior mediastinum exaggerating its diameter. Small hiatal hernia without esophageal thickening. The trachea and central airways are clear. Lungs/Pleura: There is no pleural effusion or pneumothorax. Mild paraseptal emphysematous changes are noted in the lung apices. There is diffuse bronchial thickening, greater in the lower lobes, with scattered bilateral lower lobe  subsegmental bronchial impactions. There is posterior atelectasis without evidence of bronchopneumonia. There is mosaic attenuation in the upper lobes most likely due to small airways disease with air trapping. No focal pneumonia is evident.  No pulmonary nodules. Upper Abdomen: No acute abnormality. There is a cyst in hepatic segment 5 measuring 3.9 cm and 1.2 Hounsfield units. The liver is mildly steatotic. This was seen previously. Musculoskeletal: There is extensive multilevel thoracic spine bridging enthesopathy. No acute osseous findings or aggressive lesions. An elongate lipoma is again noted in the right infraspinatus muscle. Review of the MIP images confirms the above findings. IMPRESSION: 1. No evidence of acute arterial embolic filling defect. 2. Chronic linear calcified thrombus in a segmental artery to the posterior basal left lower lobe. 3. Cardiomegaly with lipomatous infiltration of the inter-atrial septum. 4. Aortic and coronary artery atherosclerosis. 5. Prominent pulmonary trunk but no right ventricular enlargement. 6. IVC and hepatic vein reflux which may suggest tricuspid regurgitation or elevated right heart pressures. 7. COPD with bronchial thickening and scattered subsegmental bronchial impactions in the lower lobes. 8. Mosaic attenuation in the upper lobes most likely due to small airways disease with air trapping. No appreciable focal pneumonia. 9. Stable mildly prominent lymph nodes. 10. Small hiatal hernia. 11. Emphysema. Aortic Atherosclerosis (ICD10-I70.0) and Emphysema (ICD10-J43.9). Electronically Signed   By: Almira Bar M.D.   On: 01/23/2023 04:50   DG Chest 2 View  Result Date: 01/22/2023 CLINICAL DATA:  Pain and shortness of breath EXAM: CHEST - 2 VIEW COMPARISON:  10/26/2022 FINDINGS: Cardiac shadow is enlarged. The overall inspiratory effort is within normal limits. No focal infiltrate is seen. No effusion is noted. IMPRESSION: No acute abnormality noted. Electronically  Signed   By: Alcide Clever M.D.   On: 01/22/2023 21:33    Microbiology: Recent Results (from the past 240 hour(s))  Surgical PCR screen     Status: Abnormal   Collection Time: 02/03/23  2:00 AM   Specimen: Nasal Mucosa; Nasal Swab  Result Value Ref Range Status   MRSA, PCR NEGATIVE NEGATIVE Final   Staphylococcus aureus POSITIVE (A) NEGATIVE Final    Comment: (NOTE) The Xpert SA Assay (FDA approved for NASAL specimens in patients 33 years of age and older), is one component of a comprehensive surveillance program. It is not intended to diagnose infection nor to guide or monitor treatment. Performed at [facility], [address], [place], [place] [address]      Labs: Basic Metabolic Panel: Recent Labs  Lab 02/04/23 0218 02/05/23 0250 02/06/23 0818 02/07/23 0648 02/08/23 0053  NA 135 135 134* 138 136  K 3.5 3.6 3.3* 3.8 3.6  CL 97* 95* 98 97* 102  CO2 28 30 26 26 27   GLUCOSE 211* 170* 199* 153* 220*  BUN 20 27* 21 23 14   CREATININE 0.93 1.03 0.83 0.86 0.95  CALCIUM 9.0 9.0 8.6* 8.8* 8.3*  MG 2.1 2.0 1.8 2.0 1.8   Liver Function Tests: Recent Labs  Lab 02/07/23 0648  AST 13*  ALT 18  ALKPHOS 52  BILITOT 0.8  PROT 5.9*  ALBUMIN 3.0*   No results for input(s): "LIPASE", "AMYLASE" in the last 168 hours. No results for input(s): "AMMONIA" in the last 168 hours. CBC: Recent Labs  Lab 02/04/23 0218 02/05/23 0250 02/06/23 0818 02/07/23 0648 02/08/23 0053  WBC 14.8* 12.6* 9.3 9.2 14.2*  HGB 14.1 14.3 12.8* 12.6* 11.9*  HCT 43.2 44.1 39.6 38.7* 37.2*  MCV 89.8 90.4 91.0 90.8 90.7  PLT 285 224 213 229 218   Cardiac Enzymes: No results for input(s): "CKTOTAL", "CKMB", "CKMBINDEX", "TROPONINI" in the last 168 hours. BNP: BNP (last 3 results) Recent Labs    12/05/22 1514 01/12/23 1405 01/22/23 2054  BNP 19.3 19.5 25.3    ProBNP (last 3 results) No results for input(s): "PROBNP" in the last 8760 hours.  CBG: Recent Labs  Lab 02/07/23 0859  02/07/23 1052 02/07/23 2030 02/08/23 0632 02/08/23 1114  GLUCAP 152* 141* 273* 194* 210*       Signed:  Zannie Cove MD.  [facility] 02/08/2023, 2:19 PM

## 2023-02-08 NOTE — Discharge Instructions (Signed)
ACTIVITY AND EXERCISE  Daily activity and exercise are an important part of your recovery. People recover at different rates depending on their general health and type of valve procedure.  Most people recovering from TAVR feel better relatively quickly   No lifting, pushing, pulling more than 10 pounds (examples to avoid: groceries, vacuuming, gardening, golfing):             - For one week with a procedure through the groin.             - For six weeks for procedures through the chest wall or neck. NOTE: You will typically see one of our providers 7-14 days after your procedure to discuss WHEN TO RESUME the above activities.      DRIVING  Do not drive until you are seen for follow up and cleared by a provider. Generally, we ask patient to not drive for 1 week after their procedure.  If you have been told by your doctor in the past that you may not drive, you must talk with him/her before you begin driving again.   DRESSING  Groin site: you may leave the clear dressing over the site for up to one week or until it falls off.   HYGIENE  If you had a femoral (leg) procedure, you may take a shower when you return home. After the shower, pat the site dry. Do NOT use powder, oils or lotions in your groin area until the site has completely healed.  If you had a chest procedure, you may shower when you return home unless specifically instructed not to by your discharging practitioner.             - DO NOT scrub incision; pat dry with a towel.             - DO NOT apply any lotions, oils, powders to the incision.             - No tub baths / swimming for at least 2 weeks.  If you notice any fevers, chills, increased pain, swelling, bleeding or pus, please contact your doctor.   ADDITIONAL INFORMATION  If you are going to have an upcoming dental procedure, please contact our office as you will require antibiotics ahead of time to prevent infection on your heart valve.    If you have any questions  or concerns you can call the structural heart phone during normal business hours 8am-4pm. If you have an urgent need after hours or weekends please call [phone] to talk to the on call provider for general cardiology. If you have an emergency that requires immediate attention, please call 911.    After TAVR Checklist  Check  Test Description   Follow up appointment in 1-2 weeks  You will see our structural heart advanced practice provider. Your incision sites will be checked and you will be cleared to drive and resume all normal activities if you are doing well.     1 month echo and follow up  You will have an echo to check on your new heart valve and be seen back in the office by a structural heart advanced practice provider.   Follow up with your primary cardiologist You will need to be seen by your primary cardiologist in the following 3-6 months after your 1 month appointment in the valve clinic.    1 year echo and follow up You will have another echo to check on your heart valve after 1 year   and be seen back in the office by a structural heart advanced practice provider. This your last structural heart visit.   Bacterial endocarditis prophylaxis  You will have to take antibiotics for the rest of your life before all dental procedures (even teeth cleanings) to protect your heart valve. Antibiotics are also required before some surgeries. Please check with your cardiologist before scheduling any surgeries. Also, please make sure to tell us if you have a penicillin allergy as you will require an alternative antibiotic.     

## 2023-02-08 NOTE — Progress Notes (Unsigned)
Enrolled patient for a 14 day Zio AT monitor to be mailed to patients home.  

## 2023-02-08 NOTE — Progress Notes (Signed)
  Echocardiogram 2D Echocardiogram has been performed.  Delcie Roch 02/08/2023, 9:23 AM

## 2023-02-08 NOTE — Progress Notes (Signed)
Physical Therapy Treatment Patient Details Name: Lawrence [name] MRN: [id number] DOB: 1955/12/04 Today's Date: 02/08/2023   History of Present Illness Pt is a 67 y/o male who presents 01/22/23 with SOB and intermittent L sided CP for ~2 weeks PTA. R/L heart cath scheduled for 6/20 while inpatient. S/P heart cath 6/20. PMH significant for HTN, CHF, PAF on Eliquis, aortic stenosis, CAD, CVA, DM II, COPD, AAA.    PT Comments  Pt with limited mobility primarily due to rt knee pain/arthritis and decr activity tolerance. At baseline pt only amb short distances. Home set up include hospital bed that can elevate and lift chair so pt able to get up easier. Asked MD for neoprene knee sleeve for rt knee to see if it improved pain or stability. Pt reports he feels comfortable returning home with his set up.      Assistance Recommended at Discharge Intermittent Supervision/Assistance  If plan is discharge home, recommend the following:  Can travel by private vehicle    A little help with walking and/or transfers;A little help with bathing/dressing/bathroom;Assistance with cooking/housework;Assist for transportation;Help with stairs or ramp for entrance      Equipment Recommendations  None recommended by PT    Recommendations for Other Services       Precautions / Restrictions Precautions Precautions: Fall Restrictions Weight Bearing Restrictions: No     Mobility  Bed Mobility               General bed mobility comments: Pt up in chair    Transfers Overall transfer level: Needs assistance Equipment used: Rolling walker (2 wheels) Transfers: Sit to/from Stand Sit to Stand: Min guard, Min assist           General transfer comment: Min assist to power up from low recliner. Min guard for safety from elevated bedside    Ambulation/Gait Ambulation/Gait assistance: Min guard Gait Distance (Feet): 35 Feet (35' x 1, 10' x 1) Assistive device: Rollator (4 wheels) (bariatric) Gait  Pattern/deviations: Step-through pattern, Decreased stride length, Antalgic, Trunk flexed, Wide base of support, Knee hyperextension - right Gait velocity: Decreased Gait velocity interpretation: <1.31 ft/sec, indicative of household ambulator   General Gait Details: Assist for safety. Pt with baseline rt knee pain/arthritis and hyperextension.   Stairs             Wheelchair Mobility     Tilt Bed    Modified Rankin (Stroke Patients Only)       Balance Overall balance assessment: Needs assistance Sitting-balance support: No upper extremity supported, Feet supported Sitting balance-Leahy Scale: Good     Standing balance support: Bilateral upper extremity supported, Reliant on assistive device for balance, During functional activity Standing balance-Leahy Scale: Poor Standing balance comment: UE support                            Cognition Arousal/Alertness: Awake/alert Behavior During Therapy: WFL for tasks assessed/performed, Flat affect Overall Cognitive Status: Within Functional Limits for tasks assessed                                          Exercises      General Comments General comments (skin integrity, edema, etc.): VSS on RA      Pertinent Vitals/Pain Pain Assessment Pain Assessment: Faces Faces Pain Scale: Hurts even more Pain Location: R knee Pain Descriptors /  Indicators: Aching, Discomfort Pain Intervention(s): Limited activity within patient's tolerance, Monitored during session    Home Living                          Prior Function            PT Goals (current goals can now be found in the care plan section) Acute Rehab PT Goals Patient Stated Goal: Return home PT Goal Formulation: With patient Time For Goal Achievement: 02/22/23 Potential to Achieve Goals: Good Progress towards PT goals: Progressing toward goals    Frequency    Min 1X/week      PT Plan Current plan remains  appropriate    Co-evaluation              AM-PAC PT "6 Clicks" Mobility   Outcome Measure  Help needed turning from your back to your side while in a flat bed without using bedrails?: A Little Help needed moving from lying on your back to sitting on the side of a flat bed without using bedrails?: A Little Help needed moving to and from a bed to a chair (including a wheelchair)?: A Little Help needed standing up from a chair using your arms (e.g., wheelchair or bedside chair)?: A Little Help needed to walk in hospital room?: A Little Help needed climbing 3-5 steps with a railing? : Total 6 Click Score: 16    End of Session Equipment Utilized During Treatment: Gait belt Activity Tolerance: Patient limited by pain Patient left: with call bell/phone within reach;in chair Nurse Communication: Mobility status PT Visit Diagnosis: Unsteadiness on feet (R26.81);Muscle weakness (generalized) (M62.81);Difficulty in walking, not elsewhere classified (R26.2)     Time: 1115-1140 PT Time Calculation (min) (ACUTE ONLY): 25 min  Charges:    $Gait Training: 23-37 mins PT General Charges $$ ACUTE PT VISIT: 1 Visit                     [facility] PT Acute Rehabilitation Services Office [phone]    Angelina Ok [facility] 02/08/2023, 1:08 PM

## 2023-02-08 NOTE — Progress Notes (Addendum)
CARDIAC REHAB PHASE I   PRE:  Rate/Rhythm: 95 first deg    BP: sitting 111/58    SpO2: 93 RA  MODE:  Ambulation: around bed to recliner   POST:  Rate/Rhythm: 106 first deg    BP: sitting 149/69     SpO2: 96 RA  Pt able to move to EOB independently. Raised bed and pt able to stand with mod assist with gait belt. Pt with 8/10 right knee pain with steps. Walked around bed with RW and chair follow. To recliner. VSS. Discussed restrictions. Pt not appropriate for further CR services including exercise guidelines and CRPII. Discussed CRPII in the future if his knee improves. PT to see later. Will sign off as more appropriate for PT. [phone]   Ethelda Chick BS, ACSM-CEP 02/08/2023 10:38 AM

## 2023-02-10 ENCOUNTER — Telehealth: Payer: Self-pay | Admitting: Physician Assistant

## 2023-02-10 LAB — KAPPA/LAMBDA LIGHT CHAINS
Kappa free light chain: 23.1 mg/L — ABNORMAL HIGH (ref 3.3–19.4)
Kappa, lambda light chain ratio: 1.27 (ref 0.26–1.65)
Lambda free light chains: 18.2 mg/L (ref 5.7–26.3)

## 2023-02-10 LAB — IMMUNOFIXATION, URINE

## 2023-02-10 NOTE — Telephone Encounter (Signed)
  [facility]   MULTIDISCIPLINARY HEART VALVE TEAM   Patient contacted regarding discharge from [facility] on 7/3  Patient understands to follow up with a structural heart APP on 7/10 at [address] [facility].  Patient understands discharge instructions? yes Patient understands medications and regimen? yes Patient understands to bring all medications to this visit? Yes  Zio monitor not sticking. I told him to call the company to get a new one. Significant other wondering about metolazone. I asked her to have him hold off on this until he is seen back in the office for follow up.   Cline Crock PA-C  [facility]

## 2023-02-13 ENCOUNTER — Encounter (HOSPITAL_COMMUNITY): Payer: Self-pay

## 2023-02-13 ENCOUNTER — Other Ambulatory Visit (HOSPITAL_COMMUNITY): Payer: Medicare HMO

## 2023-02-14 DIAGNOSIS — Z6841 Body Mass Index (BMI) 40.0 and over, adult: Secondary | ICD-10-CM | POA: Diagnosis not present

## 2023-02-14 DIAGNOSIS — G40909 Epilepsy, unspecified, not intractable, without status epilepticus: Secondary | ICD-10-CM | POA: Diagnosis not present

## 2023-02-14 DIAGNOSIS — E662 Morbid (severe) obesity with alveolar hypoventilation: Secondary | ICD-10-CM | POA: Diagnosis not present

## 2023-02-14 DIAGNOSIS — I110 Hypertensive heart disease with heart failure: Secondary | ICD-10-CM | POA: Diagnosis not present

## 2023-02-14 DIAGNOSIS — I5033 Acute on chronic diastolic (congestive) heart failure: Secondary | ICD-10-CM | POA: Diagnosis not present

## 2023-02-14 DIAGNOSIS — Z952 Presence of prosthetic heart valve: Secondary | ICD-10-CM | POA: Diagnosis not present

## 2023-02-14 DIAGNOSIS — I480 Paroxysmal atrial fibrillation: Secondary | ICD-10-CM | POA: Diagnosis not present

## 2023-02-14 DIAGNOSIS — J9601 Acute respiratory failure with hypoxia: Secondary | ICD-10-CM | POA: Diagnosis not present

## 2023-02-14 DIAGNOSIS — Z48812 Encounter for surgical aftercare following surgery on the circulatory system: Secondary | ICD-10-CM | POA: Diagnosis not present

## 2023-02-14 LAB — MULTIPLE MYELOMA PANEL, SERUM
Albumin SerPl Elph-Mcnc: 3.2 g/dL (ref 2.9–4.4)
Albumin/Glob SerPl: 1.2 (ref 0.7–1.7)
Alpha 1: 0.3 g/dL (ref 0.0–0.4)
Alpha2 Glob SerPl Elph-Mcnc: 0.7 g/dL (ref 0.4–1.0)
B-Globulin SerPl Elph-Mcnc: 0.9 g/dL (ref 0.7–1.3)
Gamma Glob SerPl Elph-Mcnc: 0.8 g/dL (ref 0.4–1.8)
Globulin, Total: 2.8 g/dL (ref 2.2–3.9)
IgA: 198 mg/dL (ref 61–437)
IgG (Immunoglobin G), Serum: 843 mg/dL (ref 603–1613)
IgM (Immunoglobulin M), Srm: 76 mg/dL (ref 20–172)
Total Protein ELP: 6 g/dL (ref 6.0–8.5)

## 2023-02-15 ENCOUNTER — Ambulatory Visit (INDEPENDENT_AMBULATORY_CARE_PROVIDER_SITE_OTHER): Payer: Medicare HMO | Admitting: Physician Assistant

## 2023-02-15 VITALS — BP 100/66 | HR 87 | Ht 74.0 in | Wt 359.0 lb

## 2023-02-15 DIAGNOSIS — Z952 Presence of prosthetic heart valve: Secondary | ICD-10-CM

## 2023-02-15 DIAGNOSIS — K089 Disorder of teeth and supporting structures, unspecified: Secondary | ICD-10-CM

## 2023-02-15 DIAGNOSIS — I480 Paroxysmal atrial fibrillation: Secondary | ICD-10-CM | POA: Diagnosis not present

## 2023-02-15 DIAGNOSIS — I43 Cardiomyopathy in diseases classified elsewhere: Secondary | ICD-10-CM | POA: Diagnosis not present

## 2023-02-15 DIAGNOSIS — I712 Thoracic aortic aneurysm, without rupture, unspecified: Secondary | ICD-10-CM

## 2023-02-15 DIAGNOSIS — E854 Organ-limited amyloidosis: Secondary | ICD-10-CM

## 2023-02-15 DIAGNOSIS — I5032 Chronic diastolic (congestive) heart failure: Secondary | ICD-10-CM

## 2023-02-15 DIAGNOSIS — I447 Left bundle-branch block, unspecified: Secondary | ICD-10-CM | POA: Diagnosis not present

## 2023-02-15 MED ORDER — AMOXICILLIN 500 MG PO TABS: 2000.0000 mg | ORAL_TABLET | ORAL | 12 refills | Status: DC

## 2023-02-15 NOTE — Progress Notes (Signed)
[facility]   MULTIDISCIPLINARY HEART VALVE CLINIC                                     Cardiology Office Note:    Date:  02/15/2023   ID:  Lawrence [name], DOB 1956-06-04, MRN [id number]  PCP:  [name], PA-C  [facility] Cardiologist:  Armanda Magic, MD  [facility] Electrophysiologist:  Will Jorja Loa, MD   Referring MD: [name], PA-C   [facility] s/p TAVR   History of Present Illness:    Lawrence [name] is a 67 y.o. male with a hx of non-obstructive CAD, chronic diastolic CHF, paroxysmal atrial fibrillation on Eliquis, HTN, HLD, morbid obesity (BMI 46), T2DM with peripheral neuropathy, COPD, OSA on CPAP, seizure disorder, morbid obesity with a BMI of 47, prior tobacco use and severe aortic stenosis s/p TAVR (02/07/23) who presents to clinic for follow up.   Recently admitted 6/16-02/08/23 for acute hypoxic respiratory failure 2/2 acute CHF and severe AS. He had ongoing chest pain and shortness of breath. He was diuresed over 30L. Had dental extractions and started on Keflex. Underwent TAVR with a 34 mm Medtronic Evolut FX THV via the TF approach on 02/07/23. Post operative echo showed EF 60%, severe LVH, normally functioning TAVR with a mean gradient of 17 mmHg and no PVL and small pericardial effusion. He was discharged home on Eliquis 5 mg BID, Farixga 10mg  daily, spiro 12.5mg  daily, torsemide 80mg  BID. Also discharged with a Zio AT given a new LBBB and pre op RBBB. Discharge weight 368 lbs.  Today the patient presents to clinic for follow up. In the house he walks around with a Rolator. He uses a motorized scooter when going out to appts. Not as tired as he was preoperatively. Weight down 10 lbs since discharge. No significant chest pain since leaving the hosptial. No dizziness or syncope. Usually has to sleep all afternoon but now much more alert. Zio Patch fell off but company mailed him a new one.    Past Medical History:  Diagnosis Date   Acquired  dilation of ascending aorta and aortic root (HCC)    40mm by echo 01/2021   Adenomatous colon polyp 2007   Anemia    Anxiety    Asthma    BPH without obstruction/lower urinary tract symptoms 02/22/2017   Chronic diastolic (congestive) heart failure (HCC)    Chronic venous stasis 03/07/2019   COPD (chronic obstructive pulmonary disease) (HCC)    Coronary artery calcification seen on CAT scan    Depression    Diabetic neuropathy (HCC) 09/11/2019   History of colon polyps 08/24/2018   Hypertension    Morbid obesity (HCC)    OSA (obstructive sleep apnea)    Pain due to onychomycosis of toenails of both feet 09/11/2019   Peripheral neuropathy 02/22/2017   Primary osteoarthritis, left shoulder 03/05/2017   PTSD (post-traumatic stress disorder)    Pure hypercholesterolemia    QT prolongation 03/07/2019   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 Lynnette Caffey and Dr. Laneta Simmers   Seizures [facility])    Severe aortic stenosis    Sinus tachycardia 03/07/2019   Sleep apnea    CPAP   Type 2 diabetes mellitus with vascular disease (HCC) 09/11/2019    Past Surgical History:  Procedure Laterality Date   ENDOVENOUS ABLATION SAPHENOUS VEIN  W/ LASER Right 08/20/2020   endovenous laser ablation right greater saphenous vein by Cari Caraway MD    ENDOVENOUS ABLATION SAPHENOUS VEIN W/ LASER Left 11/16/2022   endovenous laser ablation left greater saphenous vein by Cari Caraway MD   INTRAOPERATIVE TRANSTHORACIC ECHOCARDIOGRAM N/A 02/07/2023   Procedure: INTRAOPERATIVE TRANSTHORACIC ECHOCARDIOGRAM;  Surgeon: Orbie Pyo, MD;  Location: [facility] INVASIVE CV LAB;  Service: Open Heart Surgery;  Laterality: N/A;   JOINT REPLACEMENT     left knee replacement x 2   KNEE ARTHROSCOPY Bilateral    LEFT HEART CATH AND CORONARY ANGIOGRAPHY N/A 01/17/2018   Procedure: LEFT HEART CATH AND CORONARY ANGIOGRAPHY;  Surgeon: Marykay Lex, MD;  Location: [facility]  INVASIVE CV LAB;  Service: Cardiovascular;  Laterality: N/A;   PRESSURE SENSOR/CARDIOMEMS N/A 08/26/2021   Procedure: PRESSURE SENSOR/CARDIOMEMS;  Surgeon: Laurey Morale, MD;  Location: [facility] INVASIVE CV LAB;  Service: Cardiovascular;  Laterality: N/A;   RIGHT HEART CATH N/A 08/26/2021   Procedure: RIGHT HEART CATH;  Surgeon: Laurey Morale, MD;  Location: [facility] INVASIVE CV LAB;  Service: Cardiovascular;  Laterality: N/A;   RIGHT HEART CATH AND CORONARY ANGIOGRAPHY N/A 01/26/2023   Procedure: RIGHT HEART CATH AND CORONARY ANGIOGRAPHY;  Surgeon: Orbie Pyo, MD;  Location: [facility] INVASIVE CV LAB;  Service: Cardiovascular;  Laterality: N/A;   TEE WITHOUT CARDIOVERSION N/A 08/26/2021   Procedure: TRANSESOPHAGEAL ECHOCARDIOGRAM (TEE);  Surgeon: Laurey Morale, MD;  Location: [facility] ENDOSCOPY;  Service: Cardiovascular;  Laterality: N/A;   TOOTH EXTRACTION N/A 02/03/2023   Procedure: DENTAL RESTORATION/EXTRACTIONS;  Surgeon: [name], DMD;  Location: [facility] OR;  Service: Oral Surgery;  Laterality: N/A;   TRANSCATHETER AORTIC VALVE REPLACEMENT, TRANSFEMORAL Right 02/07/2023   Procedure: Transcatheter Aortic Valve Replacement, Transfemoral;  Surgeon: Orbie Pyo, MD;  Location: [facility] INVASIVE CV LAB;  Service: Open Heart Surgery;  Laterality: Right;   UMBILICAL HERNIA REPAIR      Current Medications: Current Meds  Medication Sig   acetaminophen (TYLENOL) 650 MG CR tablet Take 1,300 mg by mouth every 8 (eight) hours as needed for pain.   albuterol (VENTOLIN HFA) 108 (90 Base) MCG/ACT inhaler Inhale 2 puffs into the lungs every 6 (six) hours as needed for wheezing or shortness of breath.   allopurinol (ZYLOPRIM) 100 MG tablet Take 200 mg by mouth 2 (two) times daily.   amoxicillin (AMOXIL) 500 MG tablet Take 4 tablets (2,000 mg total) by mouth as directed. 1 hour prior to dental work including cleanings   ARIPiprazole (ABILIFY) 5 MG tablet TAKE ONE TABLET BY MOUTH EVERYDAY AT BEDTIME (Patient taking  differently: Take 5 mg by mouth at bedtime.)   atorvastatin (LIPITOR) 40 MG tablet Take 1 tablet (40 mg total) by mouth daily.   busPIRone (BUSPAR) 10 MG tablet Take 1 tablet (10 mg total) by mouth 3 (three) times daily.   cephALEXin (KEFLEX) 500 MG capsule Take 1 capsule (500 mg total) by mouth 4 (four) times daily.   cetirizine (ZYRTEC) 10 MG tablet Take 10 mg by mouth at bedtime.    Cholecalciferol (VITAMIN D3 PO) Take 2,000 Units by mouth daily.   dapagliflozin propanediol (FARXIGA) 10 MG TABS tablet TAKE ONE TABLET BY MOUTH BEFORE BREAKFAST (Patient taking differently: Take 10 mg by mouth daily with breakfast.)   diclofenac Sodium (VOLTAREN) 1 % GEL Apply 4 g topically 4 (four) times daily. To right knee.   ELIQUIS 5 MG TABS tablet TAKE ONE TABLET BY MOUTH TWICE DAILY (Patient taking differently: Take 5  mg by mouth 2 (two) times daily.)   fenofibrate (TRICOR) 145 MG tablet Take 1 tablet (145 mg total) by mouth daily.   ferrous sulfate 324 (65 Fe) MG TBEC Take 324 mg by mouth daily with breakfast.    fluticasone (FLONASE) 50 MCG/ACT nasal spray Place 2 sprays into both nostrils daily.    Fluticasone-Umeclidin-Vilant (TRELEGY ELLIPTA) 200-62.5-25 MCG/ACT AEPB INHALE 1 PUFF BY MOUTH INTO LUNGS ONCE DAILY (Patient taking differently: Inhale 1 puff into the lungs daily.)   insulin aspart (NOVOLOG FLEXPEN) 100 UNIT/ML FlexPen INJECT 12 UNITS BEFORE MEALS, keep sugar TWO HOURS AFTER meals UNDER 180 AT least (Patient taking differently: Inject 12 Units into the skin 3 (three) times daily with meals.)   Insulin Pen Needle (B-D ULTRAFINE III SHORT PEN) 31G X 8 MM MISC Inject 1 each into the skin 3 (three) times daily before meals. Use as instructed 4X daily.   Lacosamide 150 MG TABS Take 1 tablet (150 mg total) by mouth 2 (two) times daily.   levETIRAcetam (KEPPRA) 500 MG tablet Take 1 tablet (500 mg total) by mouth 2 (two) times daily.   metFORMIN (GLUCOPHAGE-XR) 500 MG 24 hr tablet Take 4 tablets  (2,000 mg total) by mouth daily.   metoprolol succinate (TOPROL-XL) 50 MG 24 hr tablet Take 1 tablet (50 mg total) by mouth daily.   mirtazapine (REMERON) 15 MG tablet TAKE ONE TABLET BY MOUTH EVERYDAY AT BEDTIME (Patient taking differently: Take 15 mg by mouth at bedtime.)   omeprazole (PRILOSEC) 20 MG capsule Take 20 mg by mouth every morning.   potassium chloride SA (KLOR-CON M) 20 MEQ tablet Take 2 tablets (40 mEq total) by mouth 2 (two) times daily.   prazosin (MINIPRESS) 2 MG capsule Take 1 capsule (2 mg total) by mouth at bedtime.   pregabalin (LYRICA) 150 MG capsule Take 1 capsule (150 mg total) by mouth 3 (three) times daily.   repaglinide (PRANDIN) 2 MG tablet TAKE TWO TABLETS BY MOUTH TWICE DAILY BEFORE A meal (Patient taking differently: Take 2 mg by mouth 2 (two) times daily before a meal.)   spironolactone (ALDACTONE) 25 MG tablet Take 0.5 tablets (12.5 mg total) by mouth daily.   tirzepatide (MOUNJARO) 12.5 MG/0.5ML Pen Inject 12.5 mg into the skin once a week.   torsemide (DEMADEX) 20 MG tablet Take 4 tablets (80 mg total) by mouth 2 (two) times daily.   traZODone (DESYREL) 100 MG tablet Take 200 mg by mouth at bedtime.   TRESIBA FLEXTOUCH 100 UNIT/ML FlexTouch Pen INJECT 40 UNITS into THE SKIN ONCE DAILY   VASCEPA 1 g capsule TAKE TWO CAPSULES BY MOUTH TWICE DAILY   venlafaxine XR (EFFEXOR-XR) 75 MG 24 hr capsule Take 3 capsules (225 mg total) by mouth daily with breakfast.     Allergies:   Vancomycin; Niacin and related; Tubersol [tuberculin, ppd]; and Doxycycline   Social History   Socioeconomic History   Marital status: Significant Other    Spouse name: [name]   Number of children: 1   Years of education: Not on file   Highest education level: Associate degree: academic program  Occupational History   Occupation: retired    Comment: Building control surveyor  Tobacco Use   Smoking status: Former    Packs/day: 1.00    Years: 44.00    Additional pack years: 0.00     Total pack years: 44.00    Types: Cigarettes    Quit date: 03/07/2016    Years since quitting: 6.9  Passive exposure: Past   Smokeless tobacco: Never  Vaping Use   Vaping Use: Never used  Substance and Sexual Activity   Alcohol use: Yes    Comment: rare   Drug use: No   Sexual activity: Not on file  Other Topics Concern   Not on file  Social History Narrative   Lives with wife [name]   Right Handed   Drinks 1-2 cups caffeine   Social Determinants of Health   Financial Resource Strain: Medium Risk (06/08/2021)   Overall Financial Resource Strain (CARDIA)    Difficulty of Paying Living Expenses: Somewhat hard  Food Insecurity: Food Insecurity Present (01/23/2023)   Hunger Vital Sign    Worried About Running Out of Food in the Last Year: Sometimes true    Ran Out of Food in the Last Year: Sometimes true  Transportation Needs: No Transportation Needs (01/23/2023)   PRAPARE - Administrator, Civil Service (Medical): No    Lack of Transportation (Non-Medical): No  Physical Activity: Not on file  Stress: Not on file  Social Connections: Not on file     Family History: The patient's family history includes CAD in his maternal grandfather; Diabetes in an other family member. There is no history of Diabetes Mellitus II, Colon cancer, Esophageal cancer, Inflammatory bowel disease, Liver disease, Pancreatic cancer, Rectal cancer, Stomach cancer, or Sleep apnea.  ROS:   Please see the history of present illness.    All other systems reviewed and are negative.  EKGs/Labs/Other Studies Reviewed:    Cardiac Studies & Procedures   CARDIAC CATHETERIZATION  CARDIAC CATHETERIZATION 01/26/2023  Narrative   Prox LAD to Mid LAD lesion is 40% stenosed.   Prox Cx to Mid Cx lesion is 20% stenosed.   Ost 2nd Mrg lesion is 20% stenosed.   Mid RCA lesion is 40% stenosed.   Prox Cx lesion is 20% stenosed.  1.  Mild to moderate diffuse obstructive coronary artery disease without  high-grade obstructions. 2.  Fick cardiac output of 9.7 L/min and Fick cardiac index of 3.5 L/min/m with the following hemodynamics: Right atrial pressure mean of 9 mmHg Right ventricular pressure 36/4 with an end-diastolic pressure of 16 mmHg Mean wedge pressure of 14 mmHg Pulmonary artery pressure of 34/15 with a mean of 24 mmHg  Recommendation: Continue evaluation for aortic valve intervention.  Findings Coronary Findings Diagnostic  Dominance: Right  Left Main Vessel is normal in caliber and large. Vessel is angiographically normal.  Left Anterior Descending Vessel is normal in caliber. Moderate to Large Caliber There is mild diffuse disease throughout the vessel. Prox LAD to Mid LAD lesion is 40% stenosed. The lesion is focal, tubular and smooth.  First Diagonal Branch Vessel is moderate in size. Vessel is angiographically normal.  Second Diagonal Branch Vessel is large in size. Vessel is angiographically normal.  Third Diagonal Branch Vessel is small in size. Vessel is angiographically normal.  Left Circumflex Vessel is large. Vessel is angiographically normal. The vessel is moderately ectatic. Prox Cx lesion is 20% stenosed. Prox Cx to Mid Cx lesion is 20% stenosed.  First Obtuse Marginal Branch Vessel is small in size. Vessel is angiographically normal.  Second Obtuse Marginal Branch Vessel is moderate in size. Vessel is angiographically normal. Ost 2nd Mrg lesion is 20% stenosed.  Right Coronary Artery Vessel is normal in caliber and large. Very Large Ectatic vessel (especially in the distal 1/3) There is mild diffuse disease throughout the vessel. The vessel is moderately ectatic. Mid  RCA lesion is 40% stenosed.  Inferior Septal Vessel is small in size. Vessel is angiographically normal.  Right Posterior Atrioventricular Artery Vessel is moderate in size. Vessel is angiographically normal.  First Right Posterolateral Branch Vessel is small in size.  Vessel is angiographically normal.  Intervention  No interventions have been documented.   CARDIAC CATHETERIZATION  CARDIAC CATHETERIZATION 08/26/2021  Narrative 1. Elevated R>L heart filling pressures. 2. Preserved cardiac output. 3. Pulmonary venous hypertension 4. Successful Cardiomems pressure sensor deployment.  I will increase torsemide to 80 qam/60 qpm and increase KCl to 40 bid.  He will need to start Eliquis tomorrow morning.   STRESS TESTS  MYOCARDIAL PERFUSION IMAGING 11/26/2020  Narrative  Nuclear stress EF: 55%.  The left ventricular ejection fraction is normal (55-65%).  There was no ST segment deviation noted during stress.  The study is normal.  This is a low risk study.   ECHOCARDIOGRAM  ECHOCARDIOGRAM COMPLETE 02/08/2023  Narrative ECHOCARDIOGRAM REPORT    Patient Name:   Lawrence [name] Date of Exam: 02/08/2023 Medical Rec #:  [id number]        Height:       74.0 in Accession #:    [id number]       Weight:       368.8 lb Date of Birth:  April 04, 1956        BSA:          2.819 m Patient Age:    66 years         BP:           134/68 mmHg Patient Gender: M                HR:           93 bpm. Exam Location:  Inpatient  Procedure: 2D Echo, Cardiac Doppler and Color Doppler  Indications:    Post TAVR evaluation  History:        Patient has prior history of Echocardiogram examinations, most recent 02/07/2023. COPD; Risk Factors:Sleep Apnea. Aortic Valve: 34 Medtronic stented (TAVR) valve is present in the aortic position. Procedure Date: 02/07/23.  Sonographer:    Delcie Roch RDCS Referring Phys: [id number] JILL D MCDANIEL   Sonographer Comments: Patient is obese. Image acquisition challenging due to patient body habitus. IMPRESSIONS   1. Left ventricular ejection fraction, by estimation, is 60 to 65%. The left ventricle has normal function. The left ventricle has no regional wall motion abnormalities. There is severe concentric left ventricular  hypertrophy. Indeterminate diastolic filling due to E-A fusion. There is the interventricular septum is flattened in diastole ('D' shaped left ventricle), consistent with right ventricular volume overload. 2. Right ventricular systolic function is hyperdynamic. The right ventricular size is moderately enlarged. Tricuspid regurgitation signal is inadequate for assessing PA pressure. 3. A small pericardial effusion is present. The pericardial effusion is anterior to the right ventricle. 4. The mitral valve is normal in structure. No evidence of mitral valve regurgitation. 5. The aortic valve has been repaired/replaced. Aortic valve regurgitation is not visualized. There is a 34 Medtronic stented (TAVR) valve present in the aortic position. Procedure Date: 02/07/23. Aortic valve mean gradient measures 17.0 mmHg. Aortic valve acceleration time measures 79 msec. 6. Aortic dilatation noted. There is mild dilatation of the ascending aorta, measuring 40 mm. 7. The inferior vena cava is dilated in size with >50% respiratory variability, suggesting right atrial pressure of 8 mmHg.  Comparison(s): Increase in gradients from prior with more dynamic function.  FINDINGS Left Ventricle: Left ventricular ejection fraction, by estimation, is 60 to 65%. The left ventricle has normal function. The left ventricle has no regional wall motion abnormalities. The left ventricular internal cavity size was normal in size. There is severe concentric left ventricular hypertrophy. The interventricular septum is flattened in diastole ('D' shaped left ventricle), consistent with right ventricular volume overload. Left ventricular diastolic function could not be evaluated due to abnormal septal motion. Indeterminate diastolic filling due to E-A fusion.  Right Ventricle: The right ventricular size is moderately enlarged. No increase in right ventricular wall thickness. Right ventricular systolic function is hyperdynamic. Tricuspid  regurgitation signal is inadequate for assessing PA pressure.  Left Atrium: Left atrial size was normal in size.  Right Atrium: Right atrial size was normal in size.  Pericardium: A small pericardial effusion is present. The pericardial effusion is anterior to the right ventricle. Presence of epicardial fat layer.  Mitral Valve: The mitral valve is normal in structure. No evidence of mitral valve regurgitation.  Tricuspid Valve: The tricuspid valve is normal in structure. Tricuspid valve regurgitation is trivial. No evidence of tricuspid stenosis.  Aortic Valve: Difficult LVOT Doppler signal acquisition; EOAi analysis deferred. Peak gradient 31 mm Hg. The aortic valve has been repaired/replaced. Aortic valve regurgitation is not visualized. Aortic valve mean gradient measures 17.0 mmHg. Aortic valve peak gradient measures 33.9 mmHg. Aortic valve area, by VTI measures 1.92 cm. There is a 34 Medtronic stented (TAVR) valve present in the aortic position. Procedure Date: 02/07/23.  Pulmonic Valve: The pulmonic valve was not well visualized. Pulmonic valve regurgitation is not visualized. No evidence of pulmonic stenosis.  Aorta: Aortic dilatation noted. There is mild dilatation of the ascending aorta, measuring 40 mm.  Venous: The inferior vena cava is dilated in size with greater than 50% respiratory variability, suggesting right atrial pressure of 8 mmHg.  IAS/Shunts: No atrial level shunt detected by color flow Doppler.   LEFT VENTRICLE PLAX 2D LVIDd:         5.00 cm   Diastology LVIDs:         3.20 cm   LV e' medial:  10.20 cm/s LV PW:         1.50 cm   LV e' lateral: 10.80 cm/s LV IVS:        1.30 cm LVOT diam:     2.40 cm LV SV:         95 LV SV Index:   34 LVOT Area:     4.52 cm   RIGHT VENTRICLE             IVC RV Basal diam:  3.30 cm     IVC diam: 2.50 cm RV S prime:     17.00 cm/s TAPSE (M-mode): 3.2 cm  LEFT ATRIUM             Index        RIGHT ATRIUM            Index LA diam:        4.40 cm 1.56 cm/m   RA Area:     23.20 cm LA Vol (A2C):   76.5 ml 27.14 ml/m  RA Volume:   69.90 ml  24.79 ml/m LA Vol (A4C):   91.6 ml 32.49 ml/m LA Biplane Vol: 85.4 ml 30.29 ml/m AORTIC VALVE AV Area (Vmax):    1.91 cm AV Area (Vmean):   1.95 cm AV Area (VTI):     1.92 cm AV Vmax:  291.00 cm/s AV Vmean:          190.500 cm/s AV VTI:            0.495 m AV Peak Grad:      33.9 mmHg AV Mean Grad:      17.0 mmHg LVOT Vmax:         123.00 cm/s LVOT Vmean:        82.100 cm/s LVOT VTI:          0.210 m LVOT/AV VTI ratio: 0.42  AORTA Ao Asc diam: 3.80 cm   SHUNTS Systemic VTI:  0.21 m Systemic Diam: 2.40 cm  Riley Lam MD Electronically signed by Riley Lam MD Signature Date/Time: 02/08/2023/4:18:27 PM    Final   TEE  ECHO TEE 08/26/2021  Narrative TRANSESOPHOGEAL ECHO REPORT    Patient Name:   Lawrence [name] Date of Exam: 08/26/2021 Medical Rec #:  [id number]        Height:       74.0 in Accession #:    [id number]       Weight:       384.0 lb Date of Birth:  1955-09-08        BSA:          2.868 m Patient Age:    65 years         BP:           108/60 mmHg Patient Gender: M                HR:           90 bpm. Exam Location:  Inpatient  Procedure: Transesophageal Echo, Cardiac Doppler and Color Doppler  Indications:     Aortic Stenosis  History:         Patient has prior history of Echocardiogram examinations, most recent 08/10/2021. COPD, Signs/Symptoms:Shortness of Breath; Risk Factors:Diabetes, Sleep Apnea and Hypertension.  Sonographer:     Eulah Pont RDCS Referring Phys:  [id number] [place] Diagnosing Phys: Wilfred Lacy  PROCEDURE: After discussion of the risks and benefits of a TEE, an informed consent was obtained from the patient. The transesophogeal probe was passed without difficulty through the esophogus of the patient. Local oropharyngeal anesthetic was provided with Cetacaine.  Sedation performed by different physician. The patient was monitored while under deep sedation. Anesthestetic sedation was provided intravenously by Anesthesiology: 464.21mg  of Propofol, 100mg  of Lidocaine. The patient's vital signs; including heart rate, blood pressure, and oxygen saturation; remained stable throughout the procedure. The patient developed no complications during the procedure.  IMPRESSIONS   1. Left ventricular ejection fraction, by estimation, is 60 to 65%. The left ventricle has normal function. The left ventricle has no regional wall motion abnormalities. There is mild left ventricular hypertrophy. 2. Right ventricular systolic function is normal. The right ventricular size is mildly enlarged. Tricuspid regurgitation signal is inadequate for assessing PA pressure. 3. The aortic valve is tricuspid. Aortic valve regurgitation is mild. Moderate aortic valve stenosis. Aortic valve area, by VTI measures 1.35 cm and by planimetry 1.39 cm^2. Aortic valve mean gradient measures 28.0 mmHg. 4. The mitral valve is normal in structure. Trivial mitral valve regurgitation. No evidence of mitral stenosis. 5. Left atrial size was mildly dilated. No left atrial/left atrial appendage thrombus was detected. 6. Right atrial size was mildly dilated. 7. No PFO or ASD by color doppler.  FINDINGS Left Ventricle: Left ventricular ejection fraction, by estimation, is 60 to 65%. The left ventricle has  normal function. The left ventricle has no regional wall motion abnormalities. The left ventricular internal cavity size was normal in size. There is mild left ventricular hypertrophy.  Right Ventricle: The right ventricular size is mildly enlarged. No increase in right ventricular wall thickness. Right ventricular systolic function is normal. Tricuspid regurgitation signal is inadequate for assessing PA pressure.  Left Atrium: Left atrial size was mildly dilated. No left atrial/left atrial appendage  thrombus was detected.  Right Atrium: Right atrial size was mildly dilated.  Pericardium: There is no evidence of pericardial effusion.  Mitral Valve: The mitral valve is normal in structure. Trivial mitral valve regurgitation. No evidence of mitral valve stenosis.  Tricuspid Valve: The tricuspid valve is normal in structure. Tricuspid valve regurgitation is trivial.  Aortic Valve: The aortic valve is tricuspid. Aortic valve regurgitation is mild. Moderate aortic stenosis is present. Aortic valve mean gradient measures 28.0 mmHg. Aortic valve peak gradient measures 44.0 mmHg. Aortic valve area, by VTI measures 1.35 cm.  Pulmonic Valve: The pulmonic valve was normal in structure. Pulmonic valve regurgitation is not visualized.  Aorta: The aortic root is normal in size and structure.  IAS/Shunts: No PFO or ASD by color doppler.   LEFT VENTRICLE PLAX 2D LVOT diam:     2.40 cm LV SV:         92 LV SV Index:   32 LVOT Area:     4.52 cm   AORTIC VALVE AV Area (Vmax):    1.40 cm AV Area (Vmean):   1.37 cm AV Area (VTI):     1.35 cm AV Vmax:           331.67 cm/s AV Vmean:          246.667 cm/s AV VTI:            0.682 m AV Peak Grad:      44.0 mmHg AV Mean Grad:      28.0 mmHg LVOT Vmax:         103.00 cm/s LVOT Vmean:        74.700 cm/s LVOT VTI:          0.203 m LVOT/AV VTI ratio: 0.30   SHUNTS Systemic VTI:  0.20 m Systemic Diam: 2.40 cm  Dalton McleanMD Electronically signed by Wilfred Lacy Signature Date/Time: 08/26/2021/3:04:43 PM    Final   MONITORS  CARDIAC EVENT MONITOR 12/11/2020  Narrative  Predominant rhythm is sinus tachycardia with heart rate average 101bpm and ranged from 75 to 158bpm.  Frequent bouts of paroxysmal atrial fibrillation and flutter with variable conduction.  Freqent PACs, bigeminal PACs and atrial couplets.  SVT up to 9 seconds in duration ? atrial flutter with 2:1 conduction vs. atrial tachycardia.  Occasional PVCs,  bigeminal and trigeminal PVCs, ventricular couplets and wide complex tachycardia up to 5 beats in a row  PVC load < 1%.   CT SCANS  CT CORONARY MORPH W/CTA COR W/SCORE 01/30/2023  Addendum 01/30/2023  2:12 PM ADDENDUM REPORT: 01/30/2023 14:10  CLINICAL DATA:  42M with severe aortic stenosis being evaluated for a TAVR procedure.  EXAM: Cardiac TAVR CT  TECHNIQUE: The patient was scanned on a [employer]. A 120 kV retrospective scan was triggered in the descending thoracic aorta at 111 HU's. Gantry rotation speed was 250 msecs and collimation was .6 mm. No beta blockade or nitro were given. The 3D data set was reconstructed in 5% intervals of the R-R cycle. Systolic and diastolic phases were analyzed on a  dedicated work station using MPR, MIP and VRT modes. The patient received 100 cc of contrast.  FINDINGS: Aortic Root:  Aortic valve: Trileaflet  Aortic annulus:  Diameter: 29mm x 27mm  Perimeter: 92mm  Area: 657 mm^2  Calcifications: No calcifications  Coronary height: Min Left - 16mm, Min Right - 17mm  Sinotubular height: Left cusp - 24mm; Right cusp - 27mm; Noncoronary cusp - 29mm  LVOT (as measured 3 mm below the annulus):  Diameter: 30mm x 27mm  Area: 735 mm^2  Calcifications: No calcifications  Aortic sinus width: Left cusp - 37mm; Right cusp - 37mm; Noncoronary cusp - 37mm  Sinotubular junction width: 34mm x 33mm  Optimum Fluoroscopic Angle for Delivery: RAO 5 CRA 10  IMPRESSION: 1. Poor contrast opacification, which affects accuracy of TAVR measurements  2.  Tricuspid aortic valve with severe calcifications  3. Aortic annulus measures 29mm x 27mm in diameter with perimeter 92mm and area 657 mm^2. No annular or LVOT calcifications. Annular measurements are suitable for placement of 29mm Sapien 3 valve  4. Sufficient coronary to annulus distance.  5. Optimum Fluoroscopic Angle for Delivery:  RAO 5 CRA 10  6. See prior study on  01/27/23 for aortic valve and coronary calcium scores   Electronically Signed By: Epifanio Lesches M.D. On: 01/30/2023 14:10  Narrative EXAM: OVER-READ INTERPRETATION  CT CHEST  The following report is a limited chest CT over-read performed by radiologist Dr. Duanne Guess of [facility] on 01/30/2023. This over-read does not include interpretation of cardiac or coronary anatomy or pathology. The coronary CTA interpretation by the cardiologist is attached.  COMPARISON:  01/27/2023  FINDINGS: Stable heart size. No pericardial effusion. Image thoracic aorta is nonaneurysmal. Aortic and coronary artery atherosclerosis. Central pulmonary vasculature within normal limits.  Mildly enlarged subcarinal and bilateral hilar lymph nodes, unchanged. Atelectatic changes within the dependent lung fields. Imaged lung fields are otherwise clear. No acute bony or chest wall abnormality.  IMPRESSION: 1. No new or acute extracardiac findings. 2. Mildly enlarged subcarinal and bilateral hilar lymph nodes, unchanged. 3. Aortic and coronary artery atherosclerosis (ICD10-I70.0).  Electronically Signed: By: Duanne Guess D.O. On: 01/30/2023 13:19   CT CORONARY MORPH W/CTA COR W/SCORE 01/28/2023  Addendum 01/28/2023 11:32 AM ADDENDUM REPORT: 01/28/2023 11:30  EXAM: OVER-READ INTERPRETATION  CT CHEST  The following report is an over-read performed by radiologist Dr. Lesia Hausen [facility] on 01/28/2023. This over-read does not include interpretation of cardiac or coronary anatomy or pathology. The cardiac CTA interpretation by the cardiologist is attached.  COMPARISON:  10/26/2022 chest CT angiogram.  FINDINGS: Please see the separate concurrent chest CT angiogram report for details.  IMPRESSION: Please see the separate concurrent chest CT angiogram report for details.   Electronically Signed By: Delbert Phenix M.D. On: 01/28/2023  11:30  Narrative CLINICAL DATA:  31M with severe aortic stenosis being evaluated for a TAVR procedure.  EXAM: Cardiac TAVR CT  TECHNIQUE: The patient was scanned on a [employer]. A 120 kV retrospective scan was triggered in the descending thoracic aorta at 111 HU's. Gantry rotation speed was 250 msecs and collimation was .6 mm. No beta blockade or nitro were given. The 3D data set was reconstructed in 5% intervals of the R-R cycle. Systolic and diastolic phases were analyzed on a dedicated work station using MPR, MIP and VRT modes. The patient received 100 cc of contrast.  FINDINGS: Aortic valve: Trileaflet. Poor contrast opacification, unable to make TAVR measurements  Aortic valve  calcium score: 3685  Cardiac:  Right atrium: Mild enlargement. Lipomatous hypertrophy of interatrial septum  Right ventricle: Mild enlargement  Pulmonary arteries: Normal size  Pulmonary veins: Normal configuration  Left atrium: Mild enlargement  Left ventricle: Normal size  Pericardium: Normal thickness  Coronary arteries: Calcium score 4290 (99th percentile)  IMPRESSION: 1. During contrast injection, IV pressure limit was reached, resulting in low contrast injection rate and very poor contrast opacification. Contrast is not sufficient to make TAVR measurements. Recommend repeating study.  2. Tricuspid aortic valve with severe calcifications (AV calcium score 3685)  3.  Coronary calcium score 4290 (99th percentile)  Electronically Signed: By: Epifanio Lesches M.D. On: 01/27/2023 17:48          EKG:  EKG is  ordered today.  The ekg ordered today demonstrates junctional rhythm, LBBB, HR 87  Recent Labs: 05/26/2022: TSH 1.182 01/22/2023: B Natriuretic Peptide 25.3 02/07/2023: ALT 18 02/08/2023: BUN 14; Creatinine, Ser 0.95; Hemoglobin 11.9; Magnesium 1.8; Platelets 218; Potassium 3.6; Sodium 136  Recent Lipid Panel    Component Value Date/Time   CHOL 139  12/05/2022 1514   TRIG 239 (H) 12/05/2022 1514   HDL 31 (L) 12/05/2022 1514   CHOLHDL 4.5 12/05/2022 1514   VLDL 48 (H) 12/05/2022 1514   LDLCALC 60 12/05/2022 1514     Risk Assessment/Calculations:    CHA2DS2-VASc Score = 5   This indicates a 7.2% annual risk of stroke. The patient's score is based upon: CHF History: 1 HTN History: 1 Diabetes History: 1 Stroke History: 0 Vascular Disease History: 1 Age Score: 1 Gender Score: 0      Physical Exam:    VS:  BP 100/66   Pulse 87   Ht 6\' 2"  (1.88 m)   Wt (!) 359 lb (162.8 kg)   SpO2 93%   BMI 46.09 kg/m     Wt Readings from Last 3 Encounters:  02/15/23 (!) 359 lb (162.8 kg)  02/08/23 (!) 368 lb 13.3 oz (167.3 kg)  01/19/23 (!) 360 lb (163.3 kg)     GEN:  Well nourished, well developed in no acute distress, morbidly obese in scooter HEENT: Normal NECK: No JVD LYMPHATICS: No lymphadenopathy CARDIAC: RRR, no murmurs, rubs, gallops RESPIRATORY:  Clear to auscultation without rales, wheezing or rhonchi  ABDOMEN: non-tender, non-distended MUSCULOSKELETAL:  1+ bilateral LE edema with healing blisters. No deformity  SKIN: Warm and dry. Chronic venous stasis on shins NEUROLOGIC:  Alert and oriented x 3 PSYCHIATRIC:  Normal affect   ASSESSMENT:    1. S/P TAVR (transcatheter aortic valve replacement)   2. LBBB (left bundle branch block)   3. AF (paroxysmal atrial fibrillation) (HCC)   4. Chronic diastolic CHF (congestive heart failure) (HCC)   5. Poor dentition   6. At risk for amyloid heart disease (HCC)   7. Thoracic aortic aneurysm without rupture, unspecified part (HCC)    PLAN:    In order of problems listed above:  Severe AS s/p TAVR: doing great 1 week out from TAVR. Groin sites healing well. SBE prophylaxis discussed; I have RX'd amoxicillin. Continue on Eliquis 5mg  BID. I will see him back for 1 month follow up and echo.    New LBBB: pre op ECG showed RBBB and 1st deg AV block. He developed a new LBBB  after TAVR. Wearing a Zio AT with no high risk alerts. ECG with possible junctional rhythm but no HAVB.    PAF: ECG today with junctional rhythm?. Continue Eliquis   Chronic  diastolic CHF: being managed by the advanced CHF team and has follow up 7/11. His weight is coming down (368--> 359 lbs). Continue Farixga 10mg  daily, spiro 12.5mg  daily, torsemide 80mg  BID and KDur BID. Check a BMET today.   Poor dentition: s/p extractions by Dr Barbette Merino 6/28.   Elevated Raise Score: with previous RBBB, many tracings with low voltage, neuropathy, afib and AS, plan for amyloid screening. Lab drawn while admitted and revealed a mildly elevated Kappa Free light chain but otherwise unremarkable. PYP scan set up for 03/10/23.  TAA: pre TAVR scans showed mild dilation of ascending aorta at 40mm. Recommended annual imaging by CTA/MRA. May not be significant when indexed for BSA. This will be followed over time.    Medication Adjustments/Labs and Tests Ordered: Current medicines are reviewed at length with the patient today.  Concerns regarding medicines are outlined above.  Orders Placed This Encounter  Procedures   Basic metabolic panel   MYOCARDIAL AMYLOID IMAGING PLANAR AND SPECT   EKG 12-Lead   Meds ordered this encounter  Medications   amoxicillin (AMOXIL) 500 MG tablet    Sig: Take 4 tablets (2,000 mg total) by mouth as directed. 1 hour prior to dental work including cleanings    Dispense:  12 tablet    Refill:  12    Order Specific Question:   Supervising Provider    Answer:   Tonny Bollman [[id number]]    Patient Instructions  Medication Instructions:  Your physician has recommended you make the following change in your medication:  Start Amoxicillin 500 mg, take 4 tablets by mouth 1 hour prior to dental procedures and cleanings.     *If you need a refill on your cardiac medications before your next appointment, please call your pharmacy*   Lab Work: TODAY: BMET If you have labs (blood  work) drawn today and your tests are completely normal, you will receive your results only by: MyChart Message (if you have MyChart) OR A paper copy in the mail If you have any lab test that is abnormal or we need to change your treatment, we will call you to review the results.   Testing/Procedures: YOUR PROVIDER RECOMMENDS THAT YOU GET AN AMYLOID TEST DONE    Follow-Up: At [facility], you and your health needs are our priority.  As part of our continuing mission to provide you with exceptional heart care, we have created designated Provider Care Teams.  These Care Teams include your primary Cardiologist (physician) and Advanced Practice Providers (APPs -  Physician Assistants and Nurse Practitioners) who all work together to provide you with the care you need, when you need it.  We recommend signing up for the patient portal called "MyChart".  Sign up information is provided on this After Visit Summary.  MyChart is used to connect with patients for Virtual Visits (Telemedicine).  Patients are able to view lab/test results, encounter notes, upcoming appointments, etc.  Non-urgent messages can be sent to your provider as well.   To learn more about what you can do with MyChart, go to [email or web address].    Your next appointment:   KEEP SCHEDULED FOLLOW-UP   Signed, Cline Crock, PA-C  02/15/2023 2:49 PM    [facility]

## 2023-02-15 NOTE — Addendum Note (Signed)
Addended by: Janetta Hora on: 02/15/2023 02:56 PM   Modules accepted: Level of Service

## 2023-02-15 NOTE — Patient Instructions (Signed)
Medication Instructions:  Your physician has recommended you make the following change in your medication:  Start Amoxicillin 500 mg, take 4 tablets by mouth 1 hour prior to dental procedures and cleanings.     *If you need a refill on your cardiac medications before your next appointment, please call your pharmacy*   Lab Work: TODAY: BMET If you have labs (blood work) drawn today and your tests are completely normal, you will receive your results only by: MyChart Message (if you have MyChart) OR A paper copy in the mail If you have any lab test that is abnormal or we need to change your treatment, we will call you to review the results.   Testing/Procedures: YOUR PROVIDER RECOMMENDS THAT YOU GET AN AMYLOID TEST DONE    Follow-Up: At [facility], you and your health needs are our priority.  As part of our continuing mission to provide you with exceptional heart care, we have created designated Provider Care Teams.  These Care Teams include your primary Cardiologist (physician) and Advanced Practice Providers (APPs -  Physician Assistants and Nurse Practitioners) who all work together to provide you with the care you need, when you need it.  We recommend signing up for the patient portal called "MyChart".  Sign up information is provided on this After Visit Summary.  MyChart is used to connect with patients for Virtual Visits (Telemedicine).  Patients are able to view lab/test results, encounter notes, upcoming appointments, etc.  Non-urgent messages can be sent to your provider as well.   To learn more about what you can do with MyChart, go to [email or web address].    Your next appointment:   KEEP SCHEDULED FOLLOW-UP

## 2023-02-16 ENCOUNTER — Ambulatory Visit (HOSPITAL_COMMUNITY)
Admission: RE | Admit: 2023-02-16 | Discharge: 2023-02-16 | Disposition: A | Discharge status: Home / Self Care | Payer: Medicare HMO | Source: Ambulatory Visit | Attending: Cardiology | Admitting: Cardiology

## 2023-02-16 ENCOUNTER — Encounter (HOSPITAL_COMMUNITY): Payer: Self-pay

## 2023-02-16 VITALS — BP 112/72 | HR 89 | Wt 358.0 lb

## 2023-02-16 DIAGNOSIS — Z952 Presence of prosthetic heart valve: Secondary | ICD-10-CM

## 2023-02-16 DIAGNOSIS — Z87891 Personal history of nicotine dependence: Secondary | ICD-10-CM | POA: Insufficient documentation

## 2023-02-16 DIAGNOSIS — Z8249 Family history of ischemic heart disease and other diseases of the circulatory system: Secondary | ICD-10-CM | POA: Insufficient documentation

## 2023-02-16 DIAGNOSIS — E1142 Type 2 diabetes mellitus with diabetic polyneuropathy: Secondary | ICD-10-CM | POA: Insufficient documentation

## 2023-02-16 DIAGNOSIS — Z6841 Body Mass Index (BMI) 40.0 and over, adult: Secondary | ICD-10-CM | POA: Insufficient documentation

## 2023-02-16 DIAGNOSIS — Z953 Presence of xenogenic heart valve: Secondary | ICD-10-CM | POA: Diagnosis not present

## 2023-02-16 DIAGNOSIS — I2510 Atherosclerotic heart disease of native coronary artery without angina pectoris: Secondary | ICD-10-CM | POA: Diagnosis not present

## 2023-02-16 DIAGNOSIS — I110 Hypertensive heart disease with heart failure: Secondary | ICD-10-CM | POA: Diagnosis not present

## 2023-02-16 DIAGNOSIS — Z79899 Other long term (current) drug therapy: Secondary | ICD-10-CM | POA: Insufficient documentation

## 2023-02-16 DIAGNOSIS — I452 Bifascicular block: Secondary | ICD-10-CM | POA: Diagnosis not present

## 2023-02-16 DIAGNOSIS — E785 Hyperlipidemia, unspecified: Secondary | ICD-10-CM | POA: Insufficient documentation

## 2023-02-16 DIAGNOSIS — Z7901 Long term (current) use of anticoagulants: Secondary | ICD-10-CM | POA: Insufficient documentation

## 2023-02-16 DIAGNOSIS — Z794 Long term (current) use of insulin: Secondary | ICD-10-CM | POA: Insufficient documentation

## 2023-02-16 DIAGNOSIS — I480 Paroxysmal atrial fibrillation: Secondary | ICD-10-CM | POA: Diagnosis not present

## 2023-02-16 DIAGNOSIS — Z5986 Financial insecurity: Secondary | ICD-10-CM | POA: Insufficient documentation

## 2023-02-16 DIAGNOSIS — R569 Unspecified convulsions: Secondary | ICD-10-CM | POA: Diagnosis not present

## 2023-02-16 DIAGNOSIS — Z7984 Long term (current) use of oral hypoglycemic drugs: Secondary | ICD-10-CM | POA: Insufficient documentation

## 2023-02-16 DIAGNOSIS — I5032 Chronic diastolic (congestive) heart failure: Secondary | ICD-10-CM | POA: Diagnosis not present

## 2023-02-16 DIAGNOSIS — I350 Nonrheumatic aortic (valve) stenosis: Secondary | ICD-10-CM | POA: Diagnosis not present

## 2023-02-16 LAB — BASIC METABOLIC PANEL
BUN/Creatinine Ratio: 35 — ABNORMAL HIGH (ref 10–24)
BUN: 37 mg/dL — ABNORMAL HIGH (ref 8–27)
CO2: 26 mmol/L (ref 20–29)
Calcium: 8.9 mg/dL (ref 8.6–10.2)
Chloride: 91 mmol/L — ABNORMAL LOW (ref 96–106)
Creatinine, Ser: 1.06 mg/dL (ref 0.76–1.27)
Glucose: 146 mg/dL — ABNORMAL HIGH (ref 70–99)
Potassium: 3.9 mmol/L (ref 3.5–5.2)
Sodium: 136 mmol/L (ref 134–144)
eGFR: 78 mL/min/{1.73_m2} (ref >59)

## 2023-02-16 MED ORDER — TORSEMIDE 20 MG PO TABS: 100.0000 mg | ORAL_TABLET | Freq: Two times a day (BID) | ORAL | 5 refills | Status: DC

## 2023-02-16 NOTE — Patient Instructions (Signed)
No Labs done today.   INCREASE Torsemide to 100mg  (5 tablets) by mouth 2 times daily.   No other medication changes were made. Please continue all current medications as prescribed.  Your physician recommends that you schedule a follow-up appointment in: 1 week for a lab only appointment and in 4 weeks with our NP/[facility] here in our office.   If you have any questions or concerns before your next appointment please send [place] a message through [place] or call our office at [phone].    TO LEAVE A MESSAGE FOR THE NURSE SELECT OPTION 2, PLEASE LEAVE A MESSAGE INCLUDING: YOUR NAME DATE OF BIRTH CALL BACK NUMBER REASON FOR CALL**this is important as we prioritize the call backs  YOU WILL RECEIVE A CALL BACK THE SAME DAY AS LONG AS YOU CALL BEFORE 4:00 PM   Do the following things EVERYDAY: Weigh yourself in the morning before breakfast. Write it down and keep it in a log. Take your medicines as prescribed Eat low salt foods--Limit salt (sodium) to 2000 mg per day.  Stay as active as you can everyday Limit all fluids for the day to less than 2 liters   At the [facility], you and your health needs are our priority. As part of our continuing mission to provide you with exceptional heart care, we have created designated Provider Care Teams. These Care Teams include your primary Cardiologist (physician) and Advanced Practice Providers (APPs- Physician Assistants and Nurse Practitioners) who all work together to provide you with the care you need, when you need it.   You may see any of the following providers on your designated Care Team at your next follow up: Dr Arvilla Meres Dr Marca Ancona Dr. Marcos Eke, NP Robbie Lis, [place] [facility] [place], [place] Brynda Peon, NP Karle Plumber, PharmD   Please be sure to bring in all your medications bottles to every appointment.    Thank you for choosing [facility]

## 2023-02-16 NOTE — Progress Notes (Signed)
PCP: [name], PA-C Cardiology: Dr. Mayford Knife HF Cardiology: Dr. Shirlee Latch  67 y.o. with history of paroxysmal atrial fibrillation, OSA/?OHS, morbid obesity, and chronic diastolic CHF was referred by Dr. Mayford Knife for evaluation of CHF.  Patient has been admitted multiple times in the last year for CHF exacerbations.  He uses a walker around the house and a motorized scooter outside the house.   He had a negative cath in 2019 and a negative Cardiolite in 4/22.  Echo in 11/22 was a difficult study but showed EF 60-65%, mild LVH, mildly dilated LV, moderate aortic stenosis (trileaflet aortic valve) with mean gradient 22 mmHg, DI 0.27.   He was admitted in 12/22 with fever/PNA as well as chest pain (ruled out for MI).  He had atrial fibrillation/RVR this admission but converted back to NSR.  He was diuresed. Additionally, he was thought to have a partial seizure and Keppra was restarted.   Cardiomems was placed 08/2021  Admitted 12/15/21 with left facial droop. CT of head negative. Neurology felt this was secondary to breakthrough seizures. Continued on Keppra.   Patient has been seen by [facility] for venous insufficiency, has had laser ablation of the great saphenous vein.   Echo in 10/23 showed EF 60-65%, mild LV dilation, normal RV, moderate AS with mean gradient 28 and AVA around 1 cm^2.    Admitted in 3/24 with CHF and diuresed.   Recently admitted 6/16-02/08/23 for acute hypoxic respiratory failure secondary to a/c CHF and severe AS.  He had ongoing chest pain and shortness of breath. Echo  showed EF 60-65%, severe AS w/ mean gradient 40 mmHg and AVR by VTI 1.03 cm, RV normal. R/LHC demonstrated mild-mod diffuse nonobstructive CAD, mRA 9, PAP 34/15 w/ mean 24, mPCW 14, FICK CO 9.7 L/min and Fick cardiac index of 3.5 L/min/m. He was diuresed over 30L. Had dental extractions and started on Keflex. Underwent TAVR with a 34 mm Medtronic Evolut FX THV via the TF approach on 02/07/23. Post operative echo  showed EF 60%, severe LVH, normally functioning TAVR with a mean gradient of 17 mmHg and no PVL and small pericardial effusion. Also discharged with a Zio AT given a new LBBB and pre op RBBB. W/u for amyloidosis also initiated. Multiple myeloma panel showed no M-spike protein. Kappa free light chain elevated at 23.1 however Kappa Lambda ratio WNL. PYP scan ordered. Discharge weight 368 lbs. Also of note, he was discharged home on a lower dose of torsemide, given he had diuresed a massive amount. Prior to admit, he was on torsemide 100 mg bid. He was discharged home on 80 mg BID + Farxiga 10 and spiro 12.5 mg.   Seen in structural heart clinic yesterday for 1 wk post-op f/u. Was doing well. Groin sites healing well. BMP stable w/ SCr 1.06 and K 3.9. EKG showed junctional rhythm 87 bpm.  Plan to RTC in 1 month w/ repeat echo.   Presents today to the [facility]. Still wearing Zio AT. No high risk alerts. Wt is down since discharge but recently started tirzepatide. There is subjective measurements that suggest volume overload. He has a CardioMEMs and is 4 mmHg above goal dPAP. He has had consistently elevated readings since discharge. ReDs measurement also elevated at 38%. Reports full med compliance. BP is well controlled. Since TAVR, he has noticed improved exercise tolerance. Able to ambulate further distances in his home w/ less exertional dyspnea. Ambulates at home w/ a Rolator. Still uses a motorized scooter when out in public.  ECG (personally reviewed): junctional rhythm 87 bpm   Cardiomems Reading  = 18 Goal =14   Labs (11/22): K 3.7, creatinine 0.89 Labs (1/23): K 3.7, creatinine 0.93 => 0.84, BNP 18 => 37 Labs (03/09/22): K 3.6 Creatinine 1 Labs (3/24): K 3.8, creatinine 0.89, BNP 22 Labs (7/24): K 3.9, creatinine 1.06, Multiple Myeloma = No M-spike, KFLC elevated 23.1, K/L LC Ratio normal 1.27   PMH: 1. Type 2 diabetes 2. Atrial fibrillation: Paroxysmal.  3. HTN 4. Hyperlipidemia 5. OSA,  ?OHS: Uses Bipap.  6. Obesity 7. SVT 8. Chest pain: LHC in 6/19 with minimal CAD.  - Cardiolite (4/22): No ischemia.  9. Depression/anxiety.  10. Chronic diastolic CHF: Echo in 11/22 with EF 60-65%, mild LVH, mildly dilated LV, moderate aortic stenosis (trileaflet aortic valve) with mean gradient 22 mmHg, DI 0.27.  - RHC (1/23): RA mean 16, PA 49/24, mean PCWP 16, CI 3.13, PVR 2 WU.  - Cardomems in place.  - Echo (10/23): EF 60-65%, mild LV dilation, normal RV, moderate AS with mean gradient 28 and AVA around 1 cm^2.  11. Partial seizures 12. Aortic stenosis: TEE (1/23) with EF 60-65%, mild LVH, mild RVE with normal RV systolic function, mid AI, moderate AS with AVA 1.35 cm^2 and mean gradient 28 mmHg.  - Echo (10/23) with moderate AS with mean gradient 28 and AVA around 1 cm^2. 13. Venous insufficiency: Laser ablation of great saphenous vein.   Social History   Socioeconomic History   Marital status: Significant Other    Spouse name: [name]   Number of children: 1   Years of education: Not on file   Highest education level: Associate degree: academic program  Occupational History   Occupation: retired    Comment: Building control surveyor  Tobacco Use   Smoking status: Former    Current packs/day: 0.00    Average packs/day: 1 pack/day for 44.0 years (44.0 ttl pk-yrs)    Types: Cigarettes    Start date: 03/07/1972    Quit date: 03/07/2016    Years since quitting: 6.9    Passive exposure: Past   Smokeless tobacco: Never  Vaping Use   Vaping status: Never Used  Substance and Sexual Activity   Alcohol use: Yes    Comment: rare   Drug use: No   Sexual activity: Not on file  Other Topics Concern   Not on file  Social History Narrative   Lives with wife [name]   Right Handed   Drinks 1-2 cups caffeine   Social Determinants of Health   Financial Resource Strain: Medium Risk (12/06/2021)   Received from [facility], [facility]   Overall Financial Resource Strain (CARDIA)     Difficulty of Paying Living Expenses: Somewhat hard  Food Insecurity: Food Insecurity Present (01/23/2023)   Hunger Vital Sign    Worried About Running Out of Food in the Last Year: Sometimes true    Ran Out of Food in the Last Year: Sometimes true  Transportation Needs: No Transportation Needs (01/23/2023)   PRAPARE - Administrator, Civil Service (Medical): No    Lack of Transportation (Non-Medical): No  Physical Activity: Unknown (12/06/2021)   Received from [facility], [facility]   Exercise Vital Sign    Days of Exercise per Week: Patient declined    Minutes of Exercise per Session: 10 min  Stress: No Stress Concern Present (01/21/2022)   Received from [facility], [facility] - Occupational  Stress Questionnaire    Feeling of Stress : Only a little  Recent Concern: Stress - Stress Concern Present (11/19/2021)   Received from [facility] - Occupational Stress Questionnaire    Feeling of Stress : To some extent  Social Connections: Unknown (12/18/2022)   Received from [facility], [facility]   Social Network    Social Network: Not on file  Intimate Partner Violence: Unknown (01/23/2023)   Humiliation, Afraid, Rape, and Kick questionnaire    Fear of Current or Ex-Partner: No    Emotionally Abused: No    Physically Abused: Patient unable to answer    Sexually Abused: No   Family History  Problem Relation Age of Onset   CAD Maternal Grandfather    Diabetes Other    Diabetes Mellitus II Neg Hx    Colon cancer Neg Hx    Esophageal cancer Neg Hx    Inflammatory bowel disease Neg Hx    Liver disease Neg Hx    Pancreatic cancer Neg Hx    Rectal cancer Neg Hx    Stomach cancer Neg Hx    Sleep apnea Neg Hx    ROS: All systems reviewed and negative except as per HPI.   Current Outpatient Medications  Medication Sig Dispense Refill   acetaminophen (TYLENOL) 650 MG CR  tablet Take 1,300 mg by mouth every 8 (eight) hours as needed for pain.     albuterol (VENTOLIN HFA) 108 (90 Base) MCG/ACT inhaler Inhale 2 puffs into the lungs every 6 (six) hours as needed for wheezing or shortness of breath. 1 each 6   allopurinol (ZYLOPRIM) 100 MG tablet Take 200 mg by mouth 2 (two) times daily.     amoxicillin (AMOXIL) 500 MG tablet Take 4 tablets (2,000 mg total) by mouth as directed. 1 hour prior to dental work including cleanings 12 tablet 12   ARIPiprazole (ABILIFY) 5 MG tablet TAKE ONE TABLET BY MOUTH EVERYDAY AT BEDTIME (Patient taking differently: Take 5 mg by mouth at bedtime.) 90 tablet 3   atorvastatin (LIPITOR) 40 MG tablet Take 1 tablet (40 mg total) by mouth daily. 90 tablet 3   busPIRone (BUSPAR) 10 MG tablet Take 1 tablet (10 mg total) by mouth 3 (three) times daily. 270 tablet 3   cephALEXin (KEFLEX) 500 MG capsule Take 1 capsule (500 mg total) by mouth 4 (four) times daily. 20 capsule 0   cetirizine (ZYRTEC) 10 MG tablet Take 10 mg by mouth at bedtime.      Cholecalciferol (VITAMIN D3 PO) Take 2,000 Units by mouth daily.     dapagliflozin propanediol (FARXIGA) 10 MG TABS tablet TAKE ONE TABLET BY MOUTH BEFORE BREAKFAST 90 tablet 3   diclofenac Sodium (VOLTAREN) 1 % GEL Apply 4 g topically 4 (four) times daily. To right knee. 100 g 0   ELIQUIS 5 MG TABS tablet TAKE ONE TABLET BY MOUTH TWICE DAILY 180 tablet 2   fenofibrate (TRICOR) 145 MG tablet Take 1 tablet (145 mg total) by mouth daily. 90 tablet 3   ferrous sulfate 324 (65 Fe) MG TBEC Take 324 mg by mouth daily with breakfast.      fluticasone (FLONASE) 50 MCG/ACT nasal spray Place 2 sprays into both nostrils daily.      Fluticasone-Umeclidin-Vilant (TRELEGY ELLIPTA) 200-62.5-25 MCG/ACT AEPB INHALE 1 PUFF BY MOUTH INTO LUNGS ONCE DAILY 60 each 11   insulin aspart (NOVOLOG FLEXPEN) 100 UNIT/ML FlexPen INJECT 12 UNITS BEFORE MEALS, keep sugar TWO  HOURS AFTER meals UNDER 180 AT least (Patient taking  differently: Inject 12 Units into the skin 3 (three) times daily with meals.) 15 mL 3   Insulin Pen Needle (B-D ULTRAFINE III SHORT PEN) 31G X 8 MM MISC Inject 1 each into the skin 3 (three) times daily before meals. Use as instructed 4X daily. 100 each 5   Lacosamide 150 MG TABS Take 1 tablet (150 mg total) by mouth 2 (two) times daily. 180 tablet 3   levETIRAcetam (KEPPRA) 500 MG tablet Take 1 tablet (500 mg total) by mouth 2 (two) times daily. 180 tablet 3   metFORMIN (GLUCOPHAGE-XR) 500 MG 24 hr tablet Take 4 tablets (2,000 mg total) by mouth daily. 360 tablet 3   metoprolol succinate (TOPROL-XL) 50 MG 24 hr tablet Take 1 tablet (50 mg total) by mouth daily. 30 tablet 0   mirtazapine (REMERON) 15 MG tablet TAKE ONE TABLET BY MOUTH EVERYDAY AT BEDTIME 30 tablet 2   omeprazole (PRILOSEC) 20 MG capsule Take 20 mg by mouth every morning.     potassium chloride SA (KLOR-CON M) 20 MEQ tablet Take 2 tablets (40 mEq total) by mouth 2 (two) times daily.     prazosin (MINIPRESS) 2 MG capsule Take 1 capsule (2 mg total) by mouth at bedtime.     pregabalin (LYRICA) 150 MG capsule Take 1 capsule (150 mg total) by mouth 3 (three) times daily. 270 capsule 3   repaglinide (PRANDIN) 2 MG tablet TAKE TWO TABLETS BY MOUTH TWICE DAILY BEFORE A meal 120 tablet 0   spironolactone (ALDACTONE) 25 MG tablet Take 0.5 tablets (12.5 mg total) by mouth daily. 45 tablet 3   tirzepatide (MOUNJARO) 12.5 MG/0.5ML Pen Inject 12.5 mg into the skin once a week. 6 mL 0   traZODone (DESYREL) 100 MG tablet Take 200 mg by mouth at bedtime.     TRESIBA FLEXTOUCH 100 UNIT/ML FlexTouch Pen INJECT 40 UNITS into THE SKIN ONCE DAILY 15 mL 2   VASCEPA 1 g capsule TAKE TWO CAPSULES BY MOUTH TWICE DAILY 120 capsule 5   venlafaxine XR (EFFEXOR-XR) 75 MG 24 hr capsule Take 3 capsules (225 mg total) by mouth daily with breakfast. 270 capsule 3   torsemide (DEMADEX) 20 MG tablet Take 5 tablets (100 mg total) by mouth 2 (two) times daily. 300  tablet 5   No current facility-administered medications for this encounter.   BP 112/72   Pulse 89   Wt (!) 162.4 kg (358 lb) Comment: Patient morning home weight.  SpO2 92%   BMI 45.96 kg/m  Wt Readings from Last 3 Encounters:  02/16/23 (!) 162.4 kg (358 lb)  02/15/23 (!) 162.8 kg (359 lb)  02/08/23 (!) 167.3 kg (368 lb 13.3 oz)   PHYSICAL EXAM: ReDs 38%  General:  super morbidly obese, in motorized scoter. No respiratory difficulty HEENT: normal Neck: supple. Thick neck JVD not well visualized. Carotids 2+ bilat; no bruits. No lymphadenopathy or thyromegaly appreciated. Cor: PMI nondisplaced. Regular rate & rhythm. No rubs, gallops or murmurs. Lungs: clear Abdomen: obese, soft, nontender, +distended. No hepatosplenomegaly. No bruits or masses. Good bowel sounds. Extremities: no cyanosis, clubbing, rash, edema. +chronic venous stasis dermatitis  Neuro: alert & oriented x 3, cranial nerves grossly intact. moves all 4 extremities w/o difficulty. Affect pleasant.   Assessment/Plan: 1. Chronic Diastolic CHF: Echo (11/22) with EF 60-65%, mild LVH, mildly dilated LV, moderate aortic stenosis (trileaflet aortic valve) with mean gradient 22 mmHg, DI 0.27.  Echo in 10/23 with  EF 60-65%, mild LV dilation, normal RV, moderate AS with mean gradient 28 and AVA around 1 cm^2. Echo 5/24 EF 60-65%, severe AS w/ mean gradient 40 mmHg and AVR by VTI 1.03 cm, RV normal. R/LHC 6/24 mild-mod diffuse nonobstructive CAD, mRA 9, PAP 34/15 w/ mean 24, mPCW 14, FICK CO 9.7 L/min and Fick cardiac index of 3.5 L/min/m. S/p TAVR 02/07/23. Post procedural echo 7/24 EF 60-65%, RV hyperdynamic  - NYHA III confounded by obesity and deconditioning but pt reports notable improvement post TAVR  - Volume assessment difficult on physical exam due to body habitus but objective measurements suggest volume overload. He has Cardiomems. Last transmission was yesterday and was 4 mmHg above goal. Goal 14, reading was 18 mmHg. ReDs  also elevated at 38%. Will increase torsemide back to 100 mg bid. Needs f/u BMP and BNP in 7 days. Continue daily CardioMEMs transmissions for remote monitoring  - Continue Farxiga 10 mg daily  - Continue Spironolactone 12.5 mg daily  - Continue Toprol XL 50 mg daily  - Given constellation of peripheral neuropathy, diastolic CHF and conduction system disease/atrial arrhythmias, he is undergoing w/u to screen for cardiac amyloidosis. Multiple Myeloma = No M-spike, KFLC elevated 23.1, K/L LC Ratio normal 1.27. PYP scan has been ordered and scheduled for 8/2  2. Aortic stenosis: Moderate AS on TEE in 1/23 and echo in 10/23. Severe by TTE 5/24 w/ mean Gradient 40 mmHg, AVR by VTI 1.03 cm. LHC w/ nonobstructive CAD. S/p TAVR 07/02 with 34 mm Medtronic Evolut valve. No paravalvular leak and preserved LV function on echo post TAVR.  - plan 30 day post-op echo 8/2  - needs SBE prophylaxis prior to dental work  3. Atrial fibrillation: Paroxysmal. Junctional rhythm on EKG, HR 87 bpm. Wearing Zio AT per above. No high risk alerts - Continue apixaban 5 mg bid   - Continue Toprol XL  4. New LBBB and pre op RBBB s/p TAVR - currently wearing Zio AT. No high risk alerts 4. OSA/?OHS: Using Bipap nightly. Continue Bipap.  5. Obesity: Body mass index is 45.96 kg/m. - Continue tirzepatide, wt is trending down  6. Hyperlipidemia: On statin.  - recent Lipid panel 4/24 showed LDL to be at goal at 60 mg/dL  7. Seizures  - On Keppra - followed by neurology   F/u in 4 wks to reassess volume status    Robbie Lis, PA-C  02/16/2023

## 2023-02-16 NOTE — Progress Notes (Signed)
ReDS Vest / Clip - 02/16/23 0919       ReDS Vest / Clip   Station Marker D    Ruler Value 42    ReDS Value Range Moderate volume overload    ReDS Actual Value 38

## 2023-02-17 DIAGNOSIS — I447 Left bundle-branch block, unspecified: Secondary | ICD-10-CM | POA: Diagnosis not present

## 2023-02-17 DIAGNOSIS — Z952 Presence of prosthetic heart valve: Secondary | ICD-10-CM | POA: Diagnosis not present

## 2023-02-24 ENCOUNTER — Encounter: Payer: Self-pay | Admitting: Cardiology

## 2023-02-27 ENCOUNTER — Other Ambulatory Visit: Payer: Self-pay | Admitting: Cardiology

## 2023-02-27 ENCOUNTER — Other Ambulatory Visit: Payer: Self-pay | Admitting: Adult Health

## 2023-02-27 ENCOUNTER — Telehealth (HOSPITAL_COMMUNITY): Payer: Self-pay | Admitting: Adult Health

## 2023-02-27 DIAGNOSIS — E782 Mixed hyperlipidemia: Secondary | ICD-10-CM

## 2023-02-27 DIAGNOSIS — F411 Generalized anxiety disorder: Secondary | ICD-10-CM

## 2023-02-27 DIAGNOSIS — F331 Major depressive disorder, recurrent, moderate: Secondary | ICD-10-CM

## 2023-02-27 NOTE — Telephone Encounter (Signed)
Pt called  Pt aware and voiced understanding

## 2023-02-27 NOTE — Telephone Encounter (Signed)
  Cardiomems Remote Monitoring  S/P Cardiomems Implant   PAD Goal: 14 Most recent reading: 19   Recommended changes: Cardiomems reading suggestive of fluid accumulation. Please call and instruct to take an extra 20 mg torsemide x 2 days in addition to his 100 mg torsemide twice a day. Please advise low salt food choices  I continue to review and analyze the patients PA pressures weekly (and more often as needed) to bring PA pressures within the optimal range.    Ina Scrivens NP-C  11:56 AM

## 2023-03-08 NOTE — Addendum Note (Signed)
Encounter addended by: Flavia Shipper on: 03/08/2023 1:25 PM  Actions taken: Imaging Exam ended

## 2023-03-08 NOTE — Telephone Encounter (Signed)
Spoke with patient and gave instructions about the AMYLOID TEST on 03/10/23 at 12:30.

## 2023-03-10 ENCOUNTER — Other Ambulatory Visit (HOSPITAL_COMMUNITY): Payer: Self-pay | Admitting: Physician Assistant

## 2023-03-10 ENCOUNTER — Ambulatory Visit (HOSPITAL_COMMUNITY): Payer: Medicare HMO | Attending: Physician Assistant

## 2023-03-10 ENCOUNTER — Ambulatory Visit (INDEPENDENT_AMBULATORY_CARE_PROVIDER_SITE_OTHER): Payer: Medicare HMO | Admitting: Physician Assistant

## 2023-03-10 ENCOUNTER — Ambulatory Visit (HOSPITAL_BASED_OUTPATIENT_CLINIC_OR_DEPARTMENT_OTHER): Payer: Medicare HMO

## 2023-03-10 VITALS — BP 120/66 | HR 95 | Ht 74.0 in | Wt 359.0 lb

## 2023-03-10 DIAGNOSIS — I447 Left bundle-branch block, unspecified: Secondary | ICD-10-CM | POA: Insufficient documentation

## 2023-03-10 DIAGNOSIS — Z952 Presence of prosthetic heart valve: Secondary | ICD-10-CM | POA: Insufficient documentation

## 2023-03-10 DIAGNOSIS — I712 Thoracic aortic aneurysm, without rupture, unspecified: Secondary | ICD-10-CM | POA: Insufficient documentation

## 2023-03-10 DIAGNOSIS — I480 Paroxysmal atrial fibrillation: Secondary | ICD-10-CM | POA: Insufficient documentation

## 2023-03-10 DIAGNOSIS — I5032 Chronic diastolic (congestive) heart failure: Secondary | ICD-10-CM | POA: Diagnosis not present

## 2023-03-10 DIAGNOSIS — E854 Organ-limited amyloidosis: Secondary | ICD-10-CM | POA: Diagnosis present

## 2023-03-10 DIAGNOSIS — I43 Cardiomyopathy in diseases classified elsewhere: Secondary | ICD-10-CM | POA: Diagnosis not present

## 2023-03-10 LAB — ECHOCARDIOGRAM COMPLETE
AV Mean grad: 15.2 mmHg
AV Peak grad: 23.9 mmHg
Ao pk vel: 2.45 m/s
Area-P 1/2: 3.99 cm2
S' Lateral: 4.2 cm

## 2023-03-10 LAB — MYOCARDIAL AMYLOID PLANAR & SPECT: H/CL Ratio: 1.35

## 2023-03-10 MED ORDER — TECHNETIUM TC 99M PYROPHOSPHATE
21.8000 | Freq: Once | INTRAVENOUS | Status: AC
  Administered 2023-03-10: 21.8 via INTRAVENOUS

## 2023-03-10 NOTE — Progress Notes (Signed)
[facility]   MULTIDISCIPLINARY HEART VALVE CLINIC                                     Cardiology Office Note:    Date:  03/10/2023   ID:  Lawrence [name], DOB 05/10/1956, MRN [id number]  PCP:  [name], PA-C  [facility] Cardiologist:  Armanda Magic, MD  [facility] Electrophysiologist:  Will Jorja Loa, MD   Referring MD: [name], PA-C   1 month s/p TAVR   History of Present Illness:    Lawrence [name] is a 67 y.o. male with a hx of non-obstructive CAD, chronic diastolic CHF, paroxysmal atrial fibrillation on Eliquis, HTN, HLD, morbid obesity (BMI 46), T2DM with peripheral neuropathy, COPD, OSA on CPAP, seizure disorder, morbid obesity with a BMI of 47, prior tobacco use and severe aortic stenosis s/p TAVR (02/07/23) who presents to clinic for follow up.   Recently admitted 6/16-02/08/23 for acute hypoxic respiratory failure 2/2 acute CHF and severe AS. He had ongoing chest pain and shortness of breath. He was diuresed over 30L. Had dental extractions and started on Keflex. Underwent TAVR with a 34 mm Medtronic Evolut FX THV via the TF approach on 02/07/23. Post operative echo showed EF 60%, severe LVH, normally functioning TAVR with a mean gradient of 17 mmHg and no PVL and small pericardial effusion. He was discharged home on Eliquis 5 mg BID, Farixga 10mg  daily, spiro 12.5mg  daily, torsemide 80mg  BID. Also discharged with a Zio AT given a new LBBB and pre op RBBB. Discharge weight 368 lbs.  Zio patch did not show any HAVB.  Today the patient presents to clinic for follow up. Here with his significant other, Lawrence [name]. No CP or SOB. No LE edema, orthopnea or PND. No dizziness or syncope. No blood in stool or urine. No palpitations. He is much more alert since his TAVR and doing great.   Past Medical History:  Diagnosis Date   Acquired dilation of ascending aorta and aortic root (HCC)    40mm by echo 01/2021   Adenomatous colon polyp 2007   Anemia     Anxiety    Asthma    BPH without obstruction/lower urinary tract symptoms 02/22/2017   Chronic diastolic (congestive) heart failure (HCC)    Chronic venous stasis 03/07/2019   COPD (chronic obstructive pulmonary disease) (HCC)    Coronary artery calcification seen on CAT scan    Depression    Diabetic neuropathy (HCC) 09/11/2019   History of colon polyps 08/24/2018   Hypertension    Morbid obesity (HCC)    OSA (obstructive sleep apnea)    Pain due to onychomycosis of toenails of both feet 09/11/2019   Peripheral neuropathy 02/22/2017   Primary osteoarthritis, left shoulder 03/05/2017   PTSD (post-traumatic stress disorder)    Pure hypercholesterolemia    QT prolongation 03/07/2019   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 Lynnette Caffey and Dr. Laneta Simmers   Seizures [facility])    Severe aortic stenosis    Sinus tachycardia 03/07/2019   Sleep apnea    CPAP   Type 2 diabetes mellitus with vascular disease (HCC) 09/11/2019    Past Surgical History:  Procedure Laterality Date   ENDOVENOUS ABLATION SAPHENOUS VEIN W/ LASER Right 08/20/2020   endovenous laser ablation right greater saphenous vein by Cari Caraway MD  ENDOVENOUS ABLATION SAPHENOUS VEIN W/ LASER Left 11/16/2022   endovenous laser ablation left greater saphenous vein by Cari Caraway MD   INTRAOPERATIVE TRANSTHORACIC ECHOCARDIOGRAM N/A 02/07/2023   Procedure: INTRAOPERATIVE TRANSTHORACIC ECHOCARDIOGRAM;  Surgeon: Orbie Pyo, MD;  Location: [facility] INVASIVE CV LAB;  Service: Open Heart Surgery;  Laterality: N/A;   JOINT REPLACEMENT     left knee replacement x 2   KNEE ARTHROSCOPY Bilateral    LEFT HEART CATH AND CORONARY ANGIOGRAPHY N/A 01/17/2018   Procedure: LEFT HEART CATH AND CORONARY ANGIOGRAPHY;  Surgeon: Marykay Lex, MD;  Location: [facility] INVASIVE CV LAB;  Service: Cardiovascular;  Laterality: N/A;   PRESSURE SENSOR/CARDIOMEMS N/A 08/26/2021   Procedure:  PRESSURE SENSOR/CARDIOMEMS;  Surgeon: Laurey Morale, MD;  Location: [facility] INVASIVE CV LAB;  Service: Cardiovascular;  Laterality: N/A;   RIGHT HEART CATH N/A 08/26/2021   Procedure: RIGHT HEART CATH;  Surgeon: Laurey Morale, MD;  Location: [facility] INVASIVE CV LAB;  Service: Cardiovascular;  Laterality: N/A;   RIGHT HEART CATH AND CORONARY ANGIOGRAPHY N/A 01/26/2023   Procedure: RIGHT HEART CATH AND CORONARY ANGIOGRAPHY;  Surgeon: Orbie Pyo, MD;  Location: [facility] INVASIVE CV LAB;  Service: Cardiovascular;  Laterality: N/A;   TEE WITHOUT CARDIOVERSION N/A 08/26/2021   Procedure: TRANSESOPHAGEAL ECHOCARDIOGRAM (TEE);  Surgeon: Laurey Morale, MD;  Location: [facility] ENDOSCOPY;  Service: Cardiovascular;  Laterality: N/A;   TOOTH EXTRACTION N/A 02/03/2023   Procedure: DENTAL RESTORATION/EXTRACTIONS;  Surgeon: [name], DMD;  Location: [facility] OR;  Service: Oral Surgery;  Laterality: N/A;   TRANSCATHETER AORTIC VALVE REPLACEMENT, TRANSFEMORAL Right 02/07/2023   Procedure: Transcatheter Aortic Valve Replacement, Transfemoral;  Surgeon: Orbie Pyo, MD;  Location: [facility] INVASIVE CV LAB;  Service: Open Heart Surgery;  Laterality: Right;   UMBILICAL HERNIA REPAIR      Current Medications: Current Meds  Medication Sig   acetaminophen (TYLENOL) 650 MG CR tablet Take 1,300 mg by mouth every 8 (eight) hours as needed for pain.   albuterol (VENTOLIN HFA) 108 (90 Base) MCG/ACT inhaler Inhale 2 puffs into the lungs every 6 (six) hours as needed for wheezing or shortness of breath.   allopurinol (ZYLOPRIM) 100 MG tablet Take 200 mg by mouth 2 (two) times daily.   amoxicillin (AMOXIL) 500 MG tablet Take 4 tablets (2,000 mg total) by mouth as directed. 1 hour prior to dental work including cleanings   ARIPiprazole (ABILIFY) 5 MG tablet TAKE ONE TABLET BY MOUTH EVERYDAY AT BEDTIME   atorvastatin (LIPITOR) 40 MG tablet Take 1 tablet (40 mg total) by mouth daily.   busPIRone (BUSPAR) 10 MG tablet Take 1 tablet (10 mg  total) by mouth 3 (three) times daily.   cephALEXin (KEFLEX) 500 MG capsule Take 1 capsule (500 mg total) by mouth 4 (four) times daily.   cetirizine (ZYRTEC) 10 MG tablet Take 10 mg by mouth at bedtime.    Cholecalciferol (VITAMIN D3 PO) Take 2,000 Units by mouth daily.   dapagliflozin propanediol (FARXIGA) 10 MG TABS tablet TAKE ONE TABLET BY MOUTH BEFORE BREAKFAST   diclofenac Sodium (VOLTAREN) 1 % GEL Apply 4 g topically 4 (four) times daily. To right knee.   ELIQUIS 5 MG TABS tablet TAKE ONE TABLET BY MOUTH TWICE DAILY   fenofibrate (TRICOR) 145 MG tablet Take 1 tablet (145 mg total) by mouth daily.   ferrous sulfate 324 (65 Fe) MG TBEC Take 324 mg by mouth daily with breakfast.    fluticasone (FLONASE) 50 MCG/ACT nasal spray Place 2 sprays into  both nostrils daily.    Fluticasone-Umeclidin-Vilant (TRELEGY ELLIPTA) 200-62.5-25 MCG/ACT AEPB INHALE 1 PUFF BY MOUTH INTO LUNGS ONCE DAILY   insulin aspart (NOVOLOG FLEXPEN) 100 UNIT/ML FlexPen INJECT 12 UNITS BEFORE MEALS, keep sugar TWO HOURS AFTER meals UNDER 180 AT least (Patient taking differently: Inject 12 Units into the skin 3 (three) times daily with meals.)   Insulin Pen Needle (B-D ULTRAFINE III SHORT PEN) 31G X 8 MM MISC Inject 1 each into the skin 3 (three) times daily before meals. Use as instructed 4X daily.   Lacosamide 150 MG TABS Take 1 tablet (150 mg total) by mouth 2 (two) times daily.   levETIRAcetam (KEPPRA) 500 MG tablet Take 1 tablet (500 mg total) by mouth 2 (two) times daily.   metFORMIN (GLUCOPHAGE-XR) 500 MG 24 hr tablet Take 4 tablets (2,000 mg total) by mouth daily.   metoprolol succinate (TOPROL-XL) 50 MG 24 hr tablet Take 1 tablet (50 mg total) by mouth daily.   mirtazapine (REMERON) 15 MG tablet TAKE ONE TABLET BY MOUTH EVERYDAY AT BEDTIME   omeprazole (PRILOSEC) 20 MG capsule Take 20 mg by mouth every morning.   potassium chloride SA (KLOR-CON M) 20 MEQ tablet Take 2 tablets (40 mEq total) by mouth 2 (two) times  daily.   prazosin (MINIPRESS) 2 MG capsule Take 1 capsule (2 mg total) by mouth at bedtime.   pregabalin (LYRICA) 150 MG capsule Take 1 capsule (150 mg total) by mouth 3 (three) times daily.   repaglinide (PRANDIN) 2 MG tablet TAKE TWO TABLETS BY MOUTH TWICE DAILY BEFORE A meal   spironolactone (ALDACTONE) 25 MG tablet Take 0.5 tablets (12.5 mg total) by mouth daily.   tirzepatide (MOUNJARO) 12.5 MG/0.5ML Pen Inject 12.5 mg into the skin once a week.   torsemide (DEMADEX) 20 MG tablet Take 6 tablets by mouth in the morning and at bedtime.   traZODone (DESYREL) 100 MG tablet TAKE TWO TABLETS BY MOUTH EVERYDAY AT BEDTIME   TRESIBA FLEXTOUCH 100 UNIT/ML FlexTouch Pen INJECT 40 UNITS into THE SKIN ONCE DAILY   VASCEPA 1 g capsule TAKE TWO CAPSULES BY MOUTH TWICE DAILY   venlafaxine XR (EFFEXOR-XR) 75 MG 24 hr capsule Take 3 capsules (225 mg total) by mouth daily with breakfast.   [DISCONTINUED] torsemide (DEMADEX) 20 MG tablet Take 5 tablets (100 mg total) by mouth 2 (two) times daily.     Allergies:   Vancomycin; Niacin and related; Tubersol [tuberculin, ppd]; and Doxycycline   Social History   Socioeconomic History   Marital status: Significant Other    Spouse name: [name]   Number of children: 1   Years of education: Not on file   Highest education level: Associate degree: academic program  Occupational History   Occupation: retired    Comment: Building control surveyor  Tobacco Use   Smoking status: Former    Current packs/day: 0.00    Average packs/day: 1 pack/day for 44.0 years (44.0 ttl pk-yrs)    Types: Cigarettes    Start date: 03/07/1972    Quit date: 03/07/2016    Years since quitting: 7.0    Passive exposure: Past   Smokeless tobacco: Never  Vaping Use   Vaping status: Never Used  Substance and Sexual Activity   Alcohol use: Yes    Comment: rare   Drug use: No   Sexual activity: Not on file  Other Topics Concern   Not on file  Social History Narrative   Lives with  wife Lawrence [name]   Right  Handed   Drinks 1-2 cups caffeine   Social Determinants of Health   Financial Resource Strain: Medium Risk (12/06/2021)   Received from [facility], [facility]   Overall Financial Resource Strain (CARDIA)    Difficulty of Paying Living Expenses: Somewhat hard  Food Insecurity: Food Insecurity Present (01/23/2023)   Hunger Vital Sign    Worried About Running Out of Food in the Last Year: Sometimes true    Ran Out of Food in the Last Year: Sometimes true  Transportation Needs: No Transportation Needs (01/23/2023)   PRAPARE - Administrator, Civil Service (Medical): No    Lack of Transportation (Non-Medical): No  Physical Activity: Unknown (12/06/2021)   Received from [facility], [facility]   Exercise Vital Sign    Days of Exercise per Week: Patient declined    Minutes of Exercise per Session: 10 min  Stress: No Stress Concern Present (01/21/2022)   Received from [facility], [facility] of Occupational Health - Occupational Stress Questionnaire    Feeling of Stress : Only a little  Recent Concern: Stress - Stress Concern Present (11/19/2021)   Received from [facility] of Occupational Health - Occupational Stress Questionnaire    Feeling of Stress : To some extent  Social Connections: Unknown (12/18/2022)   Received from [facility], [facility]   Social Network    Social Network: Not on file     Family History: The patient's family history includes CAD in his maternal grandfather; Diabetes in an other family member. There is no history of Diabetes Mellitus II, Colon cancer, Esophageal cancer, Inflammatory bowel disease, Liver disease, Pancreatic cancer, Rectal cancer, Stomach cancer, or Sleep apnea.  ROS:   Please see the history of present illness.    All other systems reviewed and are negative.  EKGs/Labs/Other Studies Reviewed:    Cardiac Studies & Procedures   CARDIAC  CATHETERIZATION  CARDIAC CATHETERIZATION 01/26/2023  Narrative   Prox LAD to Mid LAD lesion is 40% stenosed.   Prox Cx to Mid Cx lesion is 20% stenosed.   Ost 2nd Mrg lesion is 20% stenosed.   Mid RCA lesion is 40% stenosed.   Prox Cx lesion is 20% stenosed.  1.  Mild to moderate diffuse obstructive coronary artery disease without high-grade obstructions. 2.  Fick cardiac output of 9.7 L/min and Fick cardiac index of 3.5 L/min/m with the following hemodynamics: Right atrial pressure mean of 9 mmHg Right ventricular pressure 36/4 with an end-diastolic pressure of 16 mmHg Mean wedge pressure of 14 mmHg Pulmonary artery pressure of 34/15 with a mean of 24 mmHg  Recommendation: Continue evaluation for aortic valve intervention.  Findings Coronary Findings Diagnostic  Dominance: Right  Left Main Vessel is normal in caliber and large. Vessel is angiographically normal.  Left Anterior Descending Vessel is normal in caliber. Moderate to Large Caliber There is mild diffuse disease throughout the vessel. Prox LAD to Mid LAD lesion is 40% stenosed. The lesion is focal, tubular and smooth.  First Diagonal Branch Vessel is moderate in size. Vessel is angiographically normal.  Second Diagonal Branch Vessel is large in size. Vessel is angiographically normal.  Third Diagonal Branch Vessel is small in size. Vessel is angiographically normal.  Left Circumflex Vessel is large. Vessel is angiographically normal. The vessel is moderately ectatic. Prox Cx lesion is 20% stenosed. Prox Cx to Mid Cx lesion is 20% stenosed.  First Obtuse Marginal Branch Vessel is small in  size. Vessel is angiographically normal.  Second Obtuse Marginal Branch Vessel is moderate in size. Vessel is angiographically normal. Ost 2nd Mrg lesion is 20% stenosed.  Right Coronary Artery Vessel is normal in caliber and large. Very Large Ectatic vessel (especially in the distal 1/3) There is mild diffuse disease  throughout the vessel. The vessel is moderately ectatic. Mid RCA lesion is 40% stenosed.  Inferior Septal Vessel is small in size. Vessel is angiographically normal.  Right Posterior Atrioventricular Artery Vessel is moderate in size. Vessel is angiographically normal.  First Right Posterolateral Branch Vessel is small in size. Vessel is angiographically normal.  Intervention  No interventions have been documented.   CARDIAC CATHETERIZATION  CARDIAC CATHETERIZATION 08/26/2021  Narrative 1. Elevated R>L heart filling pressures. 2. Preserved cardiac output. 3. Pulmonary venous hypertension 4. Successful Cardiomems pressure sensor deployment.  I will increase torsemide to 80 qam/60 qpm and increase KCl to 40 bid.  He will need to start Eliquis tomorrow morning.   STRESS TESTS  MYOCARDIAL PERFUSION IMAGING 11/26/2020  Narrative  Nuclear stress EF: 55%.  The left ventricular ejection fraction is normal (55-65%).  There was no ST segment deviation noted during stress.  The study is normal.  This is a low risk study.   ECHOCARDIOGRAM  ECHOCARDIOGRAM COMPLETE 03/10/2023  Narrative ECHOCARDIOGRAM REPORT    Patient Name:   BILLYE NYDAM Date of Exam: 03/10/2023 Medical Rec #:  [id number]        Height:       74.0 in Accession #:    [id number]       Weight:       358.0 lb Date of Birth:  1955-12-17        BSA:          2.784 m Patient Age:    66 years         BP:           100/66 mmHg Patient Gender: M                HR:           83 bpm. Exam Location:  [address]  Procedure: 2D Echo, Cardiac Doppler and Color Doppler  Indications:    Z95.2 Status post TAVR  History:        Patient has prior history of Echocardiogram examinations, most recent 02/08/2023. CAD, TAVR-66mm Medtonic Evolut FX, COPD; Risk Factors:Hypertension, Dyslipidemia and Morbid Obesity. Obstructive sleep apnea-CPAP. Aortic Valve: TAVR-67mm Medtonic Evolut FX valve is present in the aortic  position.  Sonographer:    Daphine Deutscher RDCS Referring Phys: [id number] Wille Celeste   IMPRESSIONS   1. Left ventricular ejection fraction, by estimation, is 55 to 60%. The left ventricle has normal function. The left ventricle has no regional wall motion abnormalities. Left ventricular diastolic parameters are consistent with Grade I diastolic dysfunction (impaired relaxation). 2. Right ventricular systolic function is normal. The right ventricular size is normal. 3. Left atrial size was moderately dilated. 4. The mitral valve is normal in structure. No evidence of mitral valve regurgitation. No evidence of mitral stenosis. 5. The aortic valve has been repaired/replaced. There is moderate calcification of the aortic valve. There is moderate thickening of the aortic valve. Aortic valve regurgitation is not visualized. Mild aortic valve stenosis. There is a TAVR-32mm Medtonic Evolut FX valve present in the aortic position. Aortic valve mean gradient measures 15.2 mmHg. Aortic valve Vmax measures 2.45 m/s. 6. Aortic dilatation noted. There is mild dilatation of  the ascending aorta, measuring 41 mm. 7. The inferior vena cava is normal in size with greater than 50% respiratory variability, suggesting right atrial pressure of 3 mmHg.  FINDINGS Left Ventricle: Left ventricular ejection fraction, by estimation, is 55 to 60%. The left ventricle has normal function. The left ventricle has no regional wall motion abnormalities. 3D ejection fraction reviewed and evaluated as part of the interpretation. Alternate measurement of EF is felt to be most reflective of LV function. The left ventricular internal cavity size was normal in size. There is no left ventricular hypertrophy. Left ventricular diastolic parameters are consistent with Grade I diastolic dysfunction (impaired relaxation).  Right Ventricle: The right ventricular size is normal. No increase in right ventricular wall thickness.  Right ventricular systolic function is normal.  Left Atrium: Left atrial size was moderately dilated.  Right Atrium: Right atrial size was normal in size.  Pericardium: There is no evidence of pericardial effusion.  Mitral Valve: The mitral valve is normal in structure. No evidence of mitral valve regurgitation. No evidence of mitral valve stenosis.  Tricuspid Valve: The tricuspid valve is normal in structure. Tricuspid valve regurgitation is not demonstrated. No evidence of tricuspid stenosis.  Aortic Valve: TAVR-84mm Medtonic Evolut FX. The aortic valve has been repaired/replaced. There is moderate calcification of the aortic valve. There is moderate thickening of the aortic valve. Aortic valve regurgitation is not visualized. Mild aortic stenosis is present. Aortic valve mean gradient measures 15.2 mmHg. Aortic valve peak gradient measures 23.9 mmHg. There is a TAVR-61mm Medtonic Evolut FX valve present in the aortic position.  Pulmonic Valve: The pulmonic valve was normal in structure. Pulmonic valve regurgitation is not visualized. No evidence of pulmonic stenosis.  Aorta: Aortic dilatation noted. There is mild dilatation of the ascending aorta, measuring 41 mm.  Venous: The inferior vena cava is normal in size with greater than 50% respiratory variability, suggesting right atrial pressure of 3 mmHg.  IAS/Shunts: No atrial level shunt detected by color flow Doppler.   LEFT VENTRICLE PLAX 2D LVIDd:         5.60 cm Diastology LVIDs:         4.20 cm LV e' medial:    7.07 cm/s LV PW:         0.90 cm LV E/e' medial:  10.9 LV IVS:        0.90 cm LV e' lateral:   5.12 cm/s LV E/e' lateral: 15.1   RIGHT VENTRICLE             IVC RV Basal diam:  5.40 cm     IVC diam: 1.80 cm RV Mid diam:    4.60 cm RV S prime:     16.03 cm/s TAPSE (M-mode): 3.2 cm  LEFT ATRIUM             Index        RIGHT ATRIUM           Index LA diam:        5.80 cm 2.08 cm/m   RA Area:     20.40 cm LA Vol  (A2C):   84.0 ml 30.18 ml/m  RA Volume:   60.70 ml  21.81 ml/m LA Vol (A4C):   66.0 ml 23.71 ml/m LA Biplane Vol: 75.9 ml 27.27 ml/m AORTIC VALVE AV Vmax:           244.60 cm/s AV Vmean:          186.000 cm/s AV VTI:  0.419 m AV Peak Grad:      23.9 mmHg AV Mean Grad:      15.2 mmHg LVOT Vmax:         100.93 cm/s LVOT Vmean:        68.767 cm/s LVOT VTI:          0.171 m LVOT/AV VTI ratio: 0.41  AORTA Ao Root diam: 3.20 cm Ao Asc diam:  4.10 cm  MITRAL VALVE MV Area (PHT): 3.99 cm    SHUNTS MV Decel Time: 190 msec    Systemic VTI: 0.17 m MV E velocity: 77.13 cm/s MV A velocity: 86.77 cm/s MV E/A ratio:  0.89  Donato Schultz MD Electronically signed by Donato Schultz MD Signature Date/Time: 03/10/2023/10:07:42 AM    Final   TEE  ECHO TEE 08/26/2021  Narrative TRANSESOPHOGEAL ECHO REPORT    Patient Name:   Lawrence [name] Date of Exam: 08/26/2021 Medical Rec #:  [id number]        Height:       74.0 in Accession #:    [id number]       Weight:       384.0 lb Date of Birth:  Mar 14, 1956        BSA:          2.868 m Patient Age:    65 years         BP:           108/60 mmHg Patient Gender: M                HR:           90 bpm. Exam Location:  Inpatient  Procedure: Transesophageal Echo, Cardiac Doppler and Color Doppler  Indications:     Aortic Stenosis  History:         Patient has prior history of Echocardiogram examinations, most recent 08/10/2021. COPD, Signs/Symptoms:Shortness of Breath; Risk Factors:Diabetes, Sleep Apnea and Hypertension.  Sonographer:     Eulah Pont RDCS Referring Phys:  [id number] [facility] Diagnosing Phys: Wilfred Lacy  PROCEDURE: After discussion of the risks and benefits of a TEE, an informed consent was obtained from the patient. The transesophogeal probe was passed without difficulty through the esophogus of the patient. Local oropharyngeal anesthetic was provided with Cetacaine. Sedation performed by different physician.  The patient was monitored while under deep sedation. Anesthestetic sedation was provided intravenously by Anesthesiology: 464.21mg  of Propofol, 100mg  of Lidocaine. The patient's vital signs; including heart rate, blood pressure, and oxygen saturation; remained stable throughout the procedure. The patient developed no complications during the procedure.  IMPRESSIONS   1. Left ventricular ejection fraction, by estimation, is 60 to 65%. The left ventricle has normal function. The left ventricle has no regional wall motion abnormalities. There is mild left ventricular hypertrophy. 2. Right ventricular systolic function is normal. The right ventricular size is mildly enlarged. Tricuspid regurgitation signal is inadequate for assessing PA pressure. 3. The aortic valve is tricuspid. Aortic valve regurgitation is mild. Moderate aortic valve stenosis. Aortic valve area, by VTI measures 1.35 cm and by planimetry 1.39 cm^2. Aortic valve mean gradient measures 28.0 mmHg. 4. The mitral valve is normal in structure. Trivial mitral valve regurgitation. No evidence of mitral stenosis. 5. Left atrial size was mildly dilated. No left atrial/left atrial appendage thrombus was detected. 6. Right atrial size was mildly dilated. 7. No PFO or ASD by color doppler.  FINDINGS Left Ventricle: Left ventricular ejection fraction, by estimation, is 60 to  65%. The left ventricle has normal function. The left ventricle has no regional wall motion abnormalities. The left ventricular internal cavity size was normal in size. There is mild left ventricular hypertrophy.  Right Ventricle: The right ventricular size is mildly enlarged. No increase in right ventricular wall thickness. Right ventricular systolic function is normal. Tricuspid regurgitation signal is inadequate for assessing PA pressure.  Left Atrium: Left atrial size was mildly dilated. No left atrial/left atrial appendage thrombus was detected.  Right Atrium: Right  atrial size was mildly dilated.  Pericardium: There is no evidence of pericardial effusion.  Mitral Valve: The mitral valve is normal in structure. Trivial mitral valve regurgitation. No evidence of mitral valve stenosis.  Tricuspid Valve: The tricuspid valve is normal in structure. Tricuspid valve regurgitation is trivial.  Aortic Valve: The aortic valve is tricuspid. Aortic valve regurgitation is mild. Moderate aortic stenosis is present. Aortic valve mean gradient measures 28.0 mmHg. Aortic valve peak gradient measures 44.0 mmHg. Aortic valve area, by VTI measures 1.35 cm.  Pulmonic Valve: The pulmonic valve was normal in structure. Pulmonic valve regurgitation is not visualized.  Aorta: The aortic root is normal in size and structure.  IAS/Shunts: No PFO or ASD by color doppler.   LEFT VENTRICLE PLAX 2D LVOT diam:     2.40 cm LV SV:         92 LV SV Index:   32 LVOT Area:     4.52 cm   AORTIC VALVE AV Area (Vmax):    1.40 cm AV Area (Vmean):   1.37 cm AV Area (VTI):     1.35 cm AV Vmax:           331.67 cm/s AV Vmean:          246.667 cm/s AV VTI:            0.682 m AV Peak Grad:      44.0 mmHg AV Mean Grad:      28.0 mmHg LVOT Vmax:         103.00 cm/s LVOT Vmean:        74.700 cm/s LVOT VTI:          0.203 m LVOT/AV VTI ratio: 0.30   SHUNTS Systemic VTI:  0.20 m Systemic Diam: 2.40 cm  Dalton McleanMD Electronically signed by Wilfred Lacy Signature Date/Time: 08/26/2021/3:04:43 PM    Final   MONITORS  LONG TERM MONITOR-LIVE TELEMETRY (3-14 DAYS) 03/08/2023  Narrative Patch Wear Time:  13 days and 1 hours (2024-07-03T14:34:31-0400 to 2024-07-20T18:35:49-0400)  Monitor 1 Patient had a min HR of 72 bpm, max HR of 97 bpm, and avg HR of 86 bpm. Predominant underlying rhythm was Sinus Rhythm First Degree AV Block was present. Bundle Branch Block/IVCD was present.. Isolated SVEs were rare (<1.0%), and no SVE Couplets or SVE Triplets were present.  Isolated VEs were rare (<1.0%), and no VE Couplets or VE Triplets were present. Difficulty discerning atrial activity making definitive diagnosis difficult to ascertain.  Monitor 2 Patient had a min HR of 68 bpm, max HR of 188 bpm, and avg HR of 89 bpm. Predominant underlying rhythm was Sinus Rhythm. First Degree AV Block was present. QRS morphology changes were present due to Intermittent Bundle Branch Block. 1 run of Ventricular Tachycardia occurred lasting 4 beats with a max rate of 162 bpm (avg 111 bpm). 598 Supraventricular Tachycardia runs occurred, the run with the fastest interval lasting 4 beats with a max rate of 188 bpm, the longest lasting 1  min 29 secs with an avg rate of 103 bpm. Isolated SVEs were rare (<1.0%), SVE Couplets were rare (<1.0%), and SVE Triplets were rare (<1.0%). Isolated VEs were frequent (9.0%, [id number]), VE Couplets were occasional (1.4%, 9143), and VE Triplets were rare (<1.0%, 4119). Ventricular Bigeminy and Trigeminy were present.  Conclusion: 1. Predominantly NSR 2. 10.4% ventricular beats 3. Frequent short SVT runs, ?atrial tachycardia   CT SCANS  CT CORONARY MORPH W/CTA COR W/SCORE 01/30/2023  Addendum 01/30/2023  2:12 PM ADDENDUM REPORT: 01/30/2023 14:10  CLINICAL DATA:  68M with severe aortic stenosis being evaluated for a TAVR procedure.  EXAM: Cardiac TAVR CT  TECHNIQUE: The patient was scanned on a [employer]. A 120 kV retrospective scan was triggered in the descending thoracic aorta at 111 HU's. Gantry rotation speed was 250 msecs and collimation was .6 mm. No beta blockade or nitro were given. The 3D data set was reconstructed in 5% intervals of the R-R cycle. Systolic and diastolic phases were analyzed on a dedicated work station using MPR, MIP and VRT modes. The patient received 100 cc of contrast.  FINDINGS: Aortic Root:  Aortic valve: Trileaflet  Aortic annulus:  Diameter: 29mm x 27mm  Perimeter: 92mm  Area: 657  mm^2  Calcifications: No calcifications  Coronary height: Min Left - 16mm, Min Right - 17mm  Sinotubular height: Left cusp - 24mm; Right cusp - 27mm; Noncoronary cusp - 29mm  LVOT (as measured 3 mm below the annulus):  Diameter: 30mm x 27mm  Area: 735 mm^2  Calcifications: No calcifications  Aortic sinus width: Left cusp - 37mm; Right cusp - 37mm; Noncoronary cusp - 37mm  Sinotubular junction width: 34mm x 33mm  Optimum Fluoroscopic Angle for Delivery: RAO 5 CRA 10  IMPRESSION: 1. Poor contrast opacification, which affects accuracy of TAVR measurements  2.  Tricuspid aortic valve with severe calcifications  3. Aortic annulus measures 29mm x 27mm in diameter with perimeter 92mm and area 657 mm^2. No annular or LVOT calcifications. Annular measurements are suitable for placement of 29mm Sapien 3 valve  4. Sufficient coronary to annulus distance.  5. Optimum Fluoroscopic Angle for Delivery:  RAO 5 CRA 10  6. See prior study on 01/27/23 for aortic valve and coronary calcium scores   Electronically Signed By: Epifanio Lesches M.D. On: 01/30/2023 14:10  Narrative EXAM: OVER-READ INTERPRETATION  CT CHEST  The following report is a limited chest CT over-read performed by radiologist Dr. Duanne Guess of [facility] on 01/30/2023. This over-read does not include interpretation of cardiac or coronary anatomy or pathology. The coronary CTA interpretation by the cardiologist is attached.  COMPARISON:  01/27/2023  FINDINGS: Stable heart size. No pericardial effusion. Image thoracic aorta is nonaneurysmal. Aortic and coronary artery atherosclerosis. Central pulmonary vasculature within normal limits.  Mildly enlarged subcarinal and bilateral hilar lymph nodes, unchanged. Atelectatic changes within the dependent lung fields. Imaged lung fields are otherwise clear. No acute bony or chest wall abnormality.  IMPRESSION: 1. No new or acute  extracardiac findings. 2. Mildly enlarged subcarinal and bilateral hilar lymph nodes, unchanged. 3. Aortic and coronary artery atherosclerosis (ICD10-I70.0).  Electronically Signed: By: Duanne Guess D.O. On: 01/30/2023 13:19   CT CORONARY MORPH W/CTA COR W/SCORE 01/27/2023  Addendum 01/28/2023 11:32 AM ADDENDUM REPORT: 01/28/2023 11:30  EXAM: OVER-READ INTERPRETATION  CT CHEST  The following report is an over-read performed by radiologist Dr. Lesia Hausen [facility] on 01/28/2023. This over-read does not include interpretation of cardiac or coronary anatomy  or pathology. The cardiac CTA interpretation by the cardiologist is attached.  COMPARISON:  10/26/2022 chest CT angiogram.  FINDINGS: Please see the separate concurrent chest CT angiogram report for details.  IMPRESSION: Please see the separate concurrent chest CT angiogram report for details.   Electronically Signed By: Delbert Phenix M.D. On: 01/28/2023 11:30  Narrative CLINICAL DATA:  28M with severe aortic stenosis being evaluated for a TAVR procedure.  EXAM: Cardiac TAVR CT  TECHNIQUE: The patient was scanned on a [employer]. A 120 kV retrospective scan was triggered in the descending thoracic aorta at 111 HU's. Gantry rotation speed was 250 msecs and collimation was .6 mm. No beta blockade or nitro were given. The 3D data set was reconstructed in 5% intervals of the R-R cycle. Systolic and diastolic phases were analyzed on a dedicated work station using MPR, MIP and VRT modes. The patient received 100 cc of contrast.  FINDINGS: Aortic valve: Trileaflet. Poor contrast opacification, unable to make TAVR measurements  Aortic valve calcium score: 3685  Cardiac:  Right atrium: Mild enlargement. Lipomatous hypertrophy of interatrial septum  Right ventricle: Mild enlargement  Pulmonary arteries: Normal size  Pulmonary veins: Normal configuration  Left atrium: Mild  enlargement  Left ventricle: Normal size  Pericardium: Normal thickness  Coronary arteries: Calcium score 4290 (99th percentile)  IMPRESSION: 1. During contrast injection, IV pressure limit was reached, resulting in low contrast injection rate and very poor contrast opacification. Contrast is not sufficient to make TAVR measurements. Recommend repeating study.  2. Tricuspid aortic valve with severe calcifications (AV calcium score 3685)  3.  Coronary calcium score 4290 (99th percentile)  Electronically Signed: By: Epifanio Lesches M.D. On: 01/27/2023 17:48          EKG:  EKG is  ordered today.  The ekg ordered today demonstrates junctional rhythm, LBBB, HR 87  Recent Labs: 05/26/2022: TSH 1.182 01/22/2023: B Natriuretic Peptide 25.3 02/07/2023: ALT 18 02/08/2023: Hemoglobin 11.9; Magnesium 1.8; Platelets 218 02/15/2023: BUN 37; Creatinine, Ser 1.06; Potassium 3.9; Sodium 136  Recent Lipid Panel    Component Value Date/Time   CHOL 139 12/05/2022 1514   TRIG 239 (H) 12/05/2022 1514   HDL 31 (L) 12/05/2022 1514   CHOLHDL 4.5 12/05/2022 1514   VLDL 48 (H) 12/05/2022 1514   LDLCALC 60 12/05/2022 1514     Risk Assessment/Calculations:    CHA2DS2-VASc Score = 5   This indicates a 7.2% annual risk of stroke. The patient's score is based upon: CHF History: 1 HTN History: 1 Diabetes History: 1 Stroke History: 0 Vascular Disease History: 1 Age Score: 1 Gender Score: 0      Physical Exam:    VS:  BP 120/66   Pulse 95   Ht 6\' 2"  (1.88 m)   Wt (!) 359 lb (162.8 kg)   SpO2 93%   BMI 46.09 kg/m     Wt Readings from Last 3 Encounters:  03/10/23 (!) 359 lb (162.8 kg)  02/16/23 (!) 358 lb (162.4 kg)  02/15/23 (!) 359 lb (162.8 kg)     GEN:  Well nourished, well developed in no acute distress, morbidly obese in scooter HEENT: Normal NECK: No JVD LYMPHATICS: No lymphadenopathy CARDIAC: RRR, no murmurs, rubs, gallops RESPIRATORY:  Clear to auscultation  without rales, wheezing or rhonchi  ABDOMEN: non-tender, non-distended MUSCULOSKELETAL:  1+ bilateral LE edema with healing blisters. No deformity  SKIN: Warm and dry. Chronic venous stasis on shins NEUROLOGIC:  Alert and oriented x 3 PSYCHIATRIC:  Normal affect   ASSESSMENT:    1. S/P TAVR (transcatheter aortic valve replacement)   2. LBBB (left bundle branch block)   3. AF (paroxysmal atrial fibrillation) (HCC)   4. Chronic diastolic CHF (congestive heart failure) (HCC)   5. At risk for amyloid heart disease (HCC)   6. Thoracic aortic aneurysm without rupture, unspecified part (HCC)     PLAN:    In order of problems listed above:  Severe AS s/p TAVR: echo today shows EF 55%, normally functioning TAVR with a mean gradient of 15.2 mm hg and no PVL. He has NHYA class I symptoms, but not very active. He has had a marked improvement in energy since TAVR. SBE prophylaxis discussed; I have RX'd amoxicillin. Continue on Eliquis 5mg  BID. I will see him back for 1 year follow up and echo.    New LBBB: pre op ECG showed RBBB and 1st deg AV block. He developed a new LBBB after TAVR. Wearing a Zio AT with no HAVB   PAF: sounds regular on exam today. Continue Eliquis   Chronic diastolic CHF: being managed by the advanced CHF team and has follow up 8/8. His weight has been stable at 359 lbs. Continue Farixga 10mg  daily, spiro 12.5mg  daily, torsemide 120mg  BID and KDur BID.    Elevated Raise Score: with previous RBBB, many tracings with low voltage, neuropathy, afib and AS, plan for amyloid screening. Lab drawn while admitted and revealed a mildly elevated Kappa Free light chain but otherwise unremarkable. PYP scan today.   TAA: pre TAVR scans showed mild dilation of ascending aorta at 40mm. Recommended annual imaging by CTA/MRA. May not be significant when indexed for BSA. This will be followed over time.    Medication Adjustments/Labs and Tests Ordered: Current medicines are reviewed at  length with the patient today.  Concerns regarding medicines are outlined above.  Orders Placed This Encounter  Procedures   ECHOCARDIOGRAM COMPLETE   No orders of the defined types were placed in this encounter.   Patient Instructions  Medication Instructions:  Your physician recommends that you continue on your current medications as directed. Please refer to the Current Medication list given to you today.  *If you need a refill on your cardiac medications before your next appointment, please call your pharmacy*   Lab Work: None ordered   If you have labs (blood work) drawn today and your tests are completely normal, you will receive your results only by: MyChart Message (if you have MyChart) OR A paper copy in the mail If you have any lab test that is abnormal or we need to change your treatment, we will call you to review the results.   Testing/Procedures: Follow up echocardiogram as scheduled    Follow-Up: Follow up as scheduled   Other Instructions     Signed, Cline Crock, PA-C  03/10/2023 11:43 AM    [facility]

## 2023-03-10 NOTE — Patient Instructions (Signed)
Medication Instructions:  Your physician recommends that you continue on your current medications as directed. Please refer to the Current Medication list given to you today.  *If you need a refill on your cardiac medications before your next appointment, please call your pharmacy*   Lab Work: None ordered   If you have labs (blood work) drawn today and your tests are completely normal, you will receive your results only by: MyChart Message (if you have MyChart) OR A paper copy in the mail If you have any lab test that is abnormal or we need to change your treatment, we will call you to review the results.   Testing/Procedures: Follow up echocardiogram as scheduled    Follow-Up: Follow up as scheduled   Other Instructions

## 2023-03-13 ENCOUNTER — Ambulatory Visit (INDEPENDENT_AMBULATORY_CARE_PROVIDER_SITE_OTHER): Payer: Medicare HMO | Admitting: Podiatry

## 2023-03-13 ENCOUNTER — Encounter: Payer: Self-pay | Admitting: Podiatry

## 2023-03-13 DIAGNOSIS — M79675 Pain in left toe(s): Secondary | ICD-10-CM | POA: Diagnosis not present

## 2023-03-13 DIAGNOSIS — B351 Tinea unguium: Secondary | ICD-10-CM | POA: Diagnosis not present

## 2023-03-13 DIAGNOSIS — M79674 Pain in right toe(s): Secondary | ICD-10-CM | POA: Diagnosis not present

## 2023-03-13 DIAGNOSIS — I878 Other specified disorders of veins: Secondary | ICD-10-CM

## 2023-03-13 DIAGNOSIS — E1142 Type 2 diabetes mellitus with diabetic polyneuropathy: Secondary | ICD-10-CM

## 2023-03-13 NOTE — Progress Notes (Signed)
This patient returns to my office for at risk foot care.  This patient requires this care by a professional since this patient will be at risk due to having diabetic neuropathy and angiopathy and venous stasis.  . This patient is unable to cut nails himself since the patient cannot reach his nails.These nails are painful walking and wearing shoes.This patient presents for at risk foot care today.  General Appearance  Alert, conversant and in no acute stress.  Vascular  Dorsalis pedis and posterior tibial  pulses are not  palpable  billaterally due to severe swelling feet.  Capillary return is within normal limits  bilaterally. Temperature is within normal limits  Bilaterally. Venous stasis right leg.  Neurologic  Senn-Weinstein monofilament wire test absent right foot.  Diminished left foot.. Muscle power within normal limits bilaterally.  Nails Thick disfigured discolored nails with subungual debris  from hallux to fifth toes bilaterally.  Pincer hallux nails   B/L.   Orthopedic  No limitations of motion  feet .  No crepitus or effusions noted.  No bony pathology or digital deformities noted.  Severe HAV  B/L.  Skin  normotropic skin with no porokeratosis noted bilaterally.  No signs of infections or ulcers noted.     Onychomycosis  Pain in right toes  Pain in left toes    Consent was obtained for treatment procedures.   Mechanical debridement of nails 1-5  bilaterally performed with a nail nipper.  Filed with dremel without incident.  Hematoma fifth toe right foot.   Return office visit   3 months                   Told patient to return for periodic foot care and evaluation due to potential at risk complications.   Helane Gunther DPM

## 2023-03-16 ENCOUNTER — Encounter (HOSPITAL_COMMUNITY): Payer: Self-pay

## 2023-03-16 ENCOUNTER — Encounter (HOSPITAL_COMMUNITY): Payer: Medicare HMO

## 2023-03-27 ENCOUNTER — Other Ambulatory Visit: Payer: Self-pay | Admitting: Adult Health

## 2023-03-27 ENCOUNTER — Other Ambulatory Visit (HOSPITAL_COMMUNITY): Payer: Self-pay | Admitting: Cardiology

## 2023-03-27 DIAGNOSIS — G4700 Insomnia, unspecified: Secondary | ICD-10-CM

## 2023-03-27 DIAGNOSIS — F331 Major depressive disorder, recurrent, moderate: Secondary | ICD-10-CM

## 2023-03-27 DIAGNOSIS — F410 Panic disorder [episodic paroxysmal anxiety] without agoraphobia: Secondary | ICD-10-CM

## 2023-03-27 DIAGNOSIS — F4310 Post-traumatic stress disorder, unspecified: Secondary | ICD-10-CM

## 2023-03-27 DIAGNOSIS — F411 Generalized anxiety disorder: Secondary | ICD-10-CM

## 2023-03-28 NOTE — Telephone Encounter (Signed)
Buspar 5mg  TID - what dose is he taking?

## 2023-04-06 ENCOUNTER — Ambulatory Visit: Payer: Medicare HMO | Admitting: Cardiology

## 2023-04-07 ENCOUNTER — Ambulatory Visit: Payer: Medicare HMO | Admitting: Adult Health

## 2023-04-12 ENCOUNTER — Encounter: Payer: Self-pay | Admitting: Adult Health

## 2023-04-12 ENCOUNTER — Ambulatory Visit (INDEPENDENT_AMBULATORY_CARE_PROVIDER_SITE_OTHER): Payer: Medicare HMO | Admitting: Adult Health

## 2023-04-12 ENCOUNTER — Ambulatory Visit: Payer: Medicare HMO | Admitting: Adult Health

## 2023-04-12 DIAGNOSIS — G4700 Insomnia, unspecified: Secondary | ICD-10-CM | POA: Diagnosis not present

## 2023-04-12 DIAGNOSIS — F410 Panic disorder [episodic paroxysmal anxiety] without agoraphobia: Secondary | ICD-10-CM

## 2023-04-12 DIAGNOSIS — F331 Major depressive disorder, recurrent, moderate: Secondary | ICD-10-CM | POA: Diagnosis not present

## 2023-04-12 DIAGNOSIS — F411 Generalized anxiety disorder: Secondary | ICD-10-CM

## 2023-04-12 DIAGNOSIS — F4310 Post-traumatic stress disorder, unspecified: Secondary | ICD-10-CM | POA: Diagnosis not present

## 2023-04-12 NOTE — Progress Notes (Signed)
Lawrence [name] [id number] 04/26/1956 67 y.o.  Subjective:   Patient ID:  Lawrence [name] is a 67 y.o. (DOB 1956/05/15) male.  Chief Complaint: No chief complaint on file.   HPI Lawrence [name] presents to the office today for follow-up of panic attacks, MDD, GAD, PTSD, and insomnia.  Previously followed by [place].  Describes mood today as "ok". Pleasant. Reports tearfulness. Mood symptoms - denies depression. Reports anxiety - "a lot of the time". Reports irritability at times. Reports decreased panic attacks. Reports some over thinking. Denies worry and rumination. Mood is lower overall. Stating "I'm dealing with things". Feels like current medications are helpful. He and partner doing well. Stable interest and motivation. Taking medications as prescribed.  Energy levels varies. Active, does not have a regular exercise routine.   Enjoys some usual interests and activities. Lives with significant other. No family local. Has a good friend locally. Appetite adequate. Weight loss 360 pounds. Sleeps better some nights than others. Averages 4 to 6 hours a night. Focus and concentration difficulties. Completing tasks. Managing aspects of household. Retired [employer]. Disabled. Denies SI or HI.  Denies AH or VH. Denies self harm. Denies substance use.  Previous medication trials: Ambien   Flowsheet Row ED to Hosp-Admission (Discharged) from 01/22/2023 in [facility] 4E CV SURGICAL PROGRESSIVE CARE ED to Hosp-Admission (Discharged) from 10/26/2022 in [facility] 3E HF PCU ED to Hosp-Admission (Discharged) from 05/25/2022 in [facility]  C-SSRS RISK CATEGORY No Risk No Risk No Risk        Review of Systems:  Review of Systems  Musculoskeletal:  Negative for gait problem.  Neurological:  Negative for tremors.  Psychiatric/Behavioral:         Please refer to HPI    Medications: I have reviewed the patient's current medications.  Current Outpatient Medications   Medication Sig Dispense Refill   acetaminophen (TYLENOL) 650 MG CR tablet Take 1,300 mg by mouth every 8 (eight) hours as needed for pain.     albuterol (VENTOLIN HFA) 108 (90 Base) MCG/ACT inhaler Inhale 2 puffs into the lungs every 6 (six) hours as needed for wheezing or shortness of breath. 1 each 6   allopurinol (ZYLOPRIM) 100 MG tablet Take 200 mg by mouth 2 (two) times daily.     amoxicillin (AMOXIL) 500 MG tablet Take 4 tablets (2,000 mg total) by mouth as directed. 1 hour prior to dental work including cleanings 12 tablet 12   ARIPiprazole (ABILIFY) 5 MG tablet TAKE ONE TABLET BY MOUTH EVERYDAY AT BEDTIME 90 tablet 0   atorvastatin (LIPITOR) 40 MG tablet Take 1 tablet (40 mg total) by mouth daily. 90 tablet 3   busPIRone (BUSPAR) 10 MG tablet TAKE ONE TABLET BY MOUTH THREE TIMES DAILY 270 tablet 3   cephALEXin (KEFLEX) 500 MG capsule Take 1 capsule (500 mg total) by mouth 4 (four) times daily. 20 capsule 0   cetirizine (ZYRTEC) 10 MG tablet Take 10 mg by mouth at bedtime.      Cholecalciferol (VITAMIN D3 PO) Take 2,000 Units by mouth daily.     dapagliflozin propanediol (FARXIGA) 10 MG TABS tablet TAKE ONE TABLET BY MOUTH BEFORE BREAKFAST 90 tablet 3   diclofenac Sodium (VOLTAREN) 1 % GEL Apply 4 g topically 4 (four) times daily. To right knee. 100 g 0   ELIQUIS 5 MG TABS tablet TAKE ONE TABLET BY MOUTH TWICE DAILY 180 tablet 2   fenofibrate (TRICOR) 145 MG tablet Take 1 tablet (145  mg total) by mouth daily. 90 tablet 3   ferrous sulfate 324 (65 Fe) MG TBEC Take 324 mg by mouth daily with breakfast.      fluticasone (FLONASE) 50 MCG/ACT nasal spray Place 2 sprays into both nostrils daily.      Fluticasone-Umeclidin-Vilant (TRELEGY ELLIPTA) 200-62.5-25 MCG/ACT AEPB INHALE 1 PUFF BY MOUTH INTO LUNGS ONCE DAILY 60 each 11   insulin aspart (NOVOLOG FLEXPEN) 100 UNIT/ML FlexPen INJECT 12 UNITS BEFORE MEALS, keep sugar TWO HOURS AFTER meals UNDER 180 AT least (Patient taking differently: Inject  12 Units into the skin 3 (three) times daily with meals.) 15 mL 3   Insulin Pen Needle (B-D ULTRAFINE III SHORT PEN) 31G X 8 MM MISC Inject 1 each into the skin 3 (three) times daily before meals. Use as instructed 4X daily. 100 each 5   Lacosamide 150 MG TABS Take 1 tablet (150 mg total) by mouth 2 (two) times daily. 180 tablet 3   levETIRAcetam (KEPPRA) 500 MG tablet Take 1 tablet (500 mg total) by mouth 2 (two) times daily. 180 tablet 3   metFORMIN (GLUCOPHAGE-XR) 500 MG 24 hr tablet Take 4 tablets (2,000 mg total) by mouth daily. 360 tablet 3   metoprolol succinate (TOPROL-XL) 50 MG 24 hr tablet Take 1 tablet (50 mg total) by mouth daily. 30 tablet 0   mirtazapine (REMERON) 15 MG tablet TAKE ONE TABLET BY MOUTH EVERYDAY AT BEDTIME 30 tablet 2   omeprazole (PRILOSEC) 20 MG capsule Take 20 mg by mouth every morning.     potassium chloride SA (KLOR-CON M) 20 MEQ tablet Take 2 tablets (40 mEq total) by mouth 2 (two) times daily.     prazosin (MINIPRESS) 2 MG capsule Take 1 capsule (2 mg total) by mouth at bedtime.     pregabalin (LYRICA) 150 MG capsule Take 1 capsule (150 mg total) by mouth 3 (three) times daily. 270 capsule 3   repaglinide (PRANDIN) 2 MG tablet TAKE TWO TABLETS BY MOUTH TWICE DAILY BEFORE A meal 120 tablet 0   spironolactone (ALDACTONE) 25 MG tablet Take 0.5 tablets (12.5 mg total) by mouth daily. 45 tablet 3   tirzepatide (MOUNJARO) 12.5 MG/0.5ML Pen Inject 12.5 mg into the skin once a week. 6 mL 0   torsemide (DEMADEX) 20 MG tablet Take 6 tablets by mouth in the morning and at bedtime.     traZODone (DESYREL) 100 MG tablet TAKE TWO TABLETS BY MOUTH EVERYDAY AT BEDTIME 180 tablet 0   TRESIBA FLEXTOUCH 100 UNIT/ML FlexTouch Pen INJECT 40 UNITS into THE SKIN ONCE DAILY 15 mL 2   VASCEPA 1 g capsule TAKE TWO CAPSULES BY MOUTH TWICE DAILY 120 capsule 2   venlafaxine XR (EFFEXOR-XR) 75 MG 24 hr capsule TAKE THREE CAPSULES BY MOUTH EVERY MORNING with BREAKFAST 270 capsule 3   No  current facility-administered medications for this visit.    Medication Side Effects: None  Allergies:  Allergies  Allergen Reactions   Vancomycin Other (See Comments)    "Red Man Syndrome" 02/02/17: possible cause for rash under both arms   Niacin And Related Other (See Comments)    Red man syndrome   Tubersol [Tuberculin, Ppd] Other (See Comments)    Reaction unknown   Doxycycline Rash and Other (See Comments)    Past Medical History:  Diagnosis Date   Acquired dilation of ascending aorta and aortic root (HCC)    40mm by echo 01/2021   Adenomatous colon polyp 2007   Anemia  Anxiety    Asthma    BPH without obstruction/lower urinary tract symptoms 02/22/2017   Chronic diastolic (congestive) heart failure (HCC)    Chronic venous stasis 03/07/2019   COPD (chronic obstructive pulmonary disease) (HCC)    Coronary artery calcification seen on CAT scan    Depression    Diabetic neuropathy (HCC) 09/11/2019   History of colon polyps 08/24/2018   Hypertension    Morbid obesity (HCC)    OSA (obstructive sleep apnea)    Pain due to onychomycosis of toenails of both feet 09/11/2019   Peripheral neuropathy 02/22/2017   Primary osteoarthritis, left shoulder 03/05/2017   PTSD (post-traumatic stress disorder)    Pure hypercholesterolemia    QT prolongation 03/07/2019   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 Lynnette Caffey and Dr. Laneta Simmers   Seizures [facility])    Severe aortic stenosis    Sinus tachycardia 03/07/2019   Sleep apnea    CPAP   Type 2 diabetes mellitus with vascular disease (HCC) 09/11/2019    Past Medical History, Surgical history, Social history, and Family history were reviewed and updated as appropriate.   Please see review of systems for further details on the patient's review from today.   Objective:   Physical Exam:  There were no vitals taken for this visit.  Physical Exam Constitutional:       General: He is not in acute distress. Musculoskeletal:        General: No deformity.  Neurological:     Mental Status: He is alert and oriented to person, place, and time.     Coordination: Coordination normal.  Psychiatric:        Attention and Perception: Attention and perception normal. He does not perceive auditory or visual hallucinations.        Mood and Affect: Affect is not labile, blunt, angry or inappropriate.        Speech: Speech normal.        Behavior: Behavior normal.        Thought Content: Thought content normal. Thought content is not paranoid or delusional. Thought content does not include homicidal or suicidal ideation. Thought content does not include homicidal or suicidal plan.        Cognition and Memory: Cognition and memory normal.        Judgment: Judgment normal.     Comments: Insight intact     Lab Review:     Component Value Date/Time   NA 136 02/15/2023 1415   K 3.9 02/15/2023 1415   CL 91 (L) 02/15/2023 1415   CO2 26 02/15/2023 1415   GLUCOSE 146 (H) 02/15/2023 1415   GLUCOSE 220 (H) 02/08/2023 0053   BUN 37 (H) 02/15/2023 1415   CREATININE 1.06 02/15/2023 1415   CALCIUM 8.9 02/15/2023 1415   PROT 5.9 (L) 02/07/2023 0648   ALBUMIN 3.0 (L) 02/07/2023 0648   AST 13 (L) 02/07/2023 0648   ALT 18 02/07/2023 0648   ALKPHOS 52 02/07/2023 0648   BILITOT 0.8 02/07/2023 0648   GFRNONAA >60 02/08/2023 0053   GFRAA 77 05/20/2020 1038       Component Value Date/Time   WBC 14.2 (H) 02/08/2023 0053   RBC 4.10 (L) 02/08/2023 0053   HGB 11.9 (L) 02/08/2023 0053   HCT 37.2 (L) 02/08/2023 0053   PLT 218 02/08/2023 0053   MCV 90.7 02/08/2023 0053   MCH 29.0 02/08/2023 0053   MCHC 32.0 02/08/2023 0053  RDW 15.0 02/08/2023 0053   LYMPHSABS 2.1 01/23/2023 0538   MONOABS 0.8 01/23/2023 0538   EOSABS 0.3 01/23/2023 0538   BASOSABS 0.1 01/23/2023 0538    No results found for: "POCLITH", "LITHIUM"   No results found for: "PHENYTOIN", "PHENOBARB",  "VALPROATE", "CBMZ"   .res Assessment: Plan:    Plan:  PDMP reviewed  Prazosin 2mg  - 2 tabs at hs Abilify 5mg  daily Effexor XR 75mg  - 3 every morning Buspar 5mg  TID  Mirtazapine 15mg  at hs Trazadone 200mg  at hs  Ricki Rodriguez - [facility] - pain management - spinal stimulator  RTC 3 months  Patient advised to contact office with any questions, adverse effects, or acute worsening in signs and symptoms.  Discussed potential metabolic side effects associated with atypical antipsychotics, as well as potential risk for movement side effects. Advised pt to contact office if movement side effects occur.   There are no diagnoses linked to this encounter.   Please see After Visit Summary for patient specific instructions.  Future Appointments  Date Time Provider Department Center  04/12/2023  1:20 PM Pascual Mantel, Thereasa Solo, NP [facility] None  05/04/2023  9:15 AM Windell Norfolk, MD [facility] None  05/25/2023  8:00 AM Altamese [place], MD [facility] None  06/15/2023  8:15 AM Helane Gunther, DPM [facility]  07/17/2023  2:40 PM Nada Libman, MD [facility]  01/17/2024  1:05 PM [id number]  01/17/2024  2:20 PM [facility]    No orders of the defined types were placed in this encounter.   -------------------------------

## 2023-04-17 ENCOUNTER — Other Ambulatory Visit: Payer: Self-pay

## 2023-04-17 ENCOUNTER — Telehealth: Payer: Self-pay | Admitting: Neurology

## 2023-04-17 ENCOUNTER — Telehealth: Payer: Self-pay | Admitting: Pulmonary Disease

## 2023-04-17 ENCOUNTER — Other Ambulatory Visit (HOSPITAL_COMMUNITY): Payer: Self-pay | Admitting: Cardiology

## 2023-04-17 DIAGNOSIS — E1165 Type 2 diabetes mellitus with hyperglycemia: Secondary | ICD-10-CM

## 2023-04-17 DIAGNOSIS — F411 Generalized anxiety disorder: Secondary | ICD-10-CM

## 2023-04-17 DIAGNOSIS — E782 Mixed hyperlipidemia: Secondary | ICD-10-CM

## 2023-04-17 DIAGNOSIS — F331 Major depressive disorder, recurrent, moderate: Secondary | ICD-10-CM

## 2023-04-17 DIAGNOSIS — G4700 Insomnia, unspecified: Secondary | ICD-10-CM

## 2023-04-17 DIAGNOSIS — F4310 Post-traumatic stress disorder, unspecified: Secondary | ICD-10-CM

## 2023-04-17 DIAGNOSIS — F410 Panic disorder [episodic paroxysmal anxiety] without agoraphobia: Secondary | ICD-10-CM

## 2023-04-17 DIAGNOSIS — I480 Paroxysmal atrial fibrillation: Secondary | ICD-10-CM

## 2023-04-17 MED ORDER — METFORMIN HCL ER 500 MG PO TB24: 2000.0000 mg | ORAL_TABLET | Freq: Every day | ORAL | 3 refills | Status: DC

## 2023-04-17 MED ORDER — SPIRONOLACTONE 25 MG PO TABS: 12.5000 mg | ORAL_TABLET | Freq: Every day | ORAL | 3 refills | Status: DC

## 2023-04-17 MED ORDER — BD PEN NEEDLE SHORT U/F 31G X 8 MM MISC
1.0000 | Freq: Three times a day (TID) | 5 refills | Status: DC
Start: 2023-04-17 — End: 2023-06-25

## 2023-04-17 MED ORDER — APIXABAN 5 MG PO TABS: 5.0000 mg | ORAL_TABLET | Freq: Two times a day (BID) | ORAL | 3 refills | Status: AC

## 2023-04-17 MED ORDER — ICOSAPENT ETHYL 1 G PO CAPS
2.0000 g | ORAL_CAPSULE | Freq: Two times a day (BID) | ORAL | 3 refills | Status: DC
Start: 2023-04-17 — End: 2023-10-12

## 2023-04-17 MED ORDER — TRAZODONE HCL 100 MG PO TABS: 200.0000 mg | ORAL_TABLET | Freq: Every day | ORAL | 0 refills | Status: DC

## 2023-04-17 MED ORDER — DAPAGLIFLOZIN PROPANEDIOL 10 MG PO TABS
ORAL_TABLET | ORAL | 3 refills | Status: DC
Start: 2023-04-17 — End: 2023-07-12

## 2023-04-17 MED ORDER — TORSEMIDE 20 MG PO TABS: 120.0000 mg | ORAL_TABLET | Freq: Two times a day (BID) | ORAL | 3 refills | Status: DC

## 2023-04-17 MED ORDER — MIRTAZAPINE 15 MG PO TABS: 15.0000 mg | ORAL_TABLET | Freq: Every day | ORAL | 0 refills | Status: DC

## 2023-04-17 MED ORDER — VENLAFAXINE HCL ER 75 MG PO CP24: 225.0000 mg | ORAL_CAPSULE | Freq: Every day | ORAL | 0 refills | Status: DC

## 2023-04-17 MED ORDER — POTASSIUM CHLORIDE CRYS ER 20 MEQ PO TBCR: 40.0000 meq | EXTENDED_RELEASE_TABLET | Freq: Two times a day (BID) | ORAL | 3 refills | Status: DC

## 2023-04-17 MED ORDER — TIRZEPATIDE 15 MG/0.5ML ~~LOC~~ SOAJ
15.0000 mg | SUBCUTANEOUS | 0 refills | Status: DC
Start: 2023-04-17 — End: 2023-06-07

## 2023-04-17 MED ORDER — ALCOHOL PREP 70 % PADS: 6.0000 | MEDICATED_PAD | Freq: Every day | 0 refills | Status: DC

## 2023-04-17 MED ORDER — BUSPIRONE HCL 10 MG PO TABS: 10.0000 mg | ORAL_TABLET | Freq: Three times a day (TID) | ORAL | 0 refills | Status: DC

## 2023-04-17 MED ORDER — ALBUTEROL SULFATE HFA 108 (90 BASE) MCG/ACT IN AERS: 2.0000 | INHALATION_SPRAY | Freq: Four times a day (QID) | RESPIRATORY_TRACT | 6 refills | Status: AC | PRN

## 2023-04-17 MED ORDER — METOPROLOL SUCCINATE ER 50 MG PO TB24: 50.0000 mg | ORAL_TABLET | Freq: Every day | ORAL | 3 refills | Status: DC

## 2023-04-17 MED ORDER — ATORVASTATIN CALCIUM 40 MG PO TABS: 40.0000 mg | ORAL_TABLET | Freq: Every day | ORAL | 3 refills | Status: DC

## 2023-04-17 MED ORDER — ARIPIPRAZOLE 5 MG PO TABS: ORAL_TABLET | ORAL | 0 refills | Status: DC

## 2023-04-17 MED ORDER — NOVOLOG FLEXPEN 100 UNIT/ML ~~LOC~~ SOPN: PEN_INJECTOR | SUBCUTANEOUS | 3 refills | Status: DC

## 2023-04-17 MED ORDER — FENOFIBRATE 145 MG PO TABS: 145.0000 mg | ORAL_TABLET | Freq: Every day | ORAL | 3 refills | Status: AC

## 2023-04-17 NOTE — Telephone Encounter (Signed)
Noted and pharmacy is in chart

## 2023-04-17 NOTE — Telephone Encounter (Signed)
Pt called and LVM stating that from now on all of his medications are needing to be sent to the [facility] on [address].

## 2023-04-17 NOTE — Telephone Encounter (Signed)
Ventolin refill sent.  Patient should contact pharmacy with other refill needs.

## 2023-04-17 NOTE — Telephone Encounter (Signed)
Patient states needs refill for Ventolin rescue inhaler. May needs more refills on other medications. Pharmacy is [facility] [address] Patient phone number is [phone].

## 2023-04-17 NOTE — Telephone Encounter (Signed)
Pt called to request all medications sent to new pharmacy [facility]) reports previous pharmacy has closed (upstream)   As requested meds sent to new pharmacy

## 2023-04-25 ENCOUNTER — Other Ambulatory Visit: Payer: Self-pay | Admitting: Adult Health

## 2023-04-25 ENCOUNTER — Other Ambulatory Visit: Payer: Self-pay | Admitting: "Endocrinology

## 2023-04-25 DIAGNOSIS — F331 Major depressive disorder, recurrent, moderate: Secondary | ICD-10-CM

## 2023-04-25 DIAGNOSIS — E1165 Type 2 diabetes mellitus with hyperglycemia: Secondary | ICD-10-CM

## 2023-04-25 DIAGNOSIS — F411 Generalized anxiety disorder: Secondary | ICD-10-CM

## 2023-04-27 DIAGNOSIS — Z952 Presence of prosthetic heart valve: Secondary | ICD-10-CM | POA: Diagnosis not present

## 2023-04-27 DIAGNOSIS — Z6841 Body Mass Index (BMI) 40.0 and over, adult: Secondary | ICD-10-CM | POA: Diagnosis not present

## 2023-04-27 DIAGNOSIS — D6869 Other thrombophilia: Secondary | ICD-10-CM | POA: Diagnosis not present

## 2023-04-27 DIAGNOSIS — E1151 Type 2 diabetes mellitus with diabetic peripheral angiopathy without gangrene: Secondary | ICD-10-CM | POA: Diagnosis not present

## 2023-04-27 DIAGNOSIS — I5033 Acute on chronic diastolic (congestive) heart failure: Secondary | ICD-10-CM | POA: Diagnosis not present

## 2023-04-27 DIAGNOSIS — I480 Paroxysmal atrial fibrillation: Secondary | ICD-10-CM | POA: Diagnosis not present

## 2023-04-27 DIAGNOSIS — I110 Hypertensive heart disease with heart failure: Secondary | ICD-10-CM | POA: Diagnosis not present

## 2023-04-27 DIAGNOSIS — G40909 Epilepsy, unspecified, not intractable, without status epilepticus: Secondary | ICD-10-CM | POA: Diagnosis not present

## 2023-04-28 ENCOUNTER — Other Ambulatory Visit: Payer: Self-pay | Admitting: Adult Health

## 2023-04-30 DIAGNOSIS — E1165 Type 2 diabetes mellitus with hyperglycemia: Secondary | ICD-10-CM | POA: Diagnosis not present

## 2023-05-04 ENCOUNTER — Encounter: Payer: Self-pay | Admitting: Neurology

## 2023-05-04 ENCOUNTER — Ambulatory Visit (INDEPENDENT_AMBULATORY_CARE_PROVIDER_SITE_OTHER): Payer: Medicare HMO | Admitting: Neurology

## 2023-05-04 VITALS — BP 108/72 | HR 84 | Ht 74.0 in | Wt 342.0 lb

## 2023-05-04 DIAGNOSIS — G4733 Obstructive sleep apnea (adult) (pediatric): Secondary | ICD-10-CM | POA: Diagnosis not present

## 2023-05-04 DIAGNOSIS — G40909 Epilepsy, unspecified, not intractable, without status epilepticus: Secondary | ICD-10-CM | POA: Diagnosis not present

## 2023-05-04 DIAGNOSIS — Z6841 Body Mass Index (BMI) 40.0 and over, adult: Secondary | ICD-10-CM

## 2023-05-04 DIAGNOSIS — E538 Deficiency of other specified B group vitamins: Secondary | ICD-10-CM | POA: Diagnosis not present

## 2023-05-04 DIAGNOSIS — E1142 Type 2 diabetes mellitus with diabetic polyneuropathy: Secondary | ICD-10-CM

## 2023-05-04 MED ORDER — LEVETIRACETAM 500 MG PO TABS: 500.0000 mg | ORAL_TABLET | Freq: Two times a day (BID) | ORAL | 3 refills | Status: AC

## 2023-05-04 MED ORDER — VITAMIN B-12 1000 MCG PO TABS: 1000.0000 ug | ORAL_TABLET | Freq: Every day | ORAL | 3 refills | Status: DC

## 2023-05-04 MED ORDER — LACOSAMIDE 150 MG PO TABS: 150.0000 mg | ORAL_TABLET | Freq: Two times a day (BID) | ORAL | 3 refills | Status: DC

## 2023-05-04 MED ORDER — PREGABALIN 150 MG PO CAPS: 150.0000 mg | ORAL_CAPSULE | Freq: Three times a day (TID) | ORAL | 3 refills | Status: DC

## 2023-05-04 NOTE — Patient Instructions (Signed)
Continue current medications, Refill given Continue follow-up with your doctors Follow-up in 1 year or sooner if worse.

## 2023-05-04 NOTE — Progress Notes (Signed)
[facility]  PATIENT: Lawrence [name] DOB: 12-25-1955  REFERRING CLINICIAN: [name], PA-C HISTORY FROM: Patient and sister Carolynn Comment FOR VISIT: [facility] follow up   HISTORICAL  CHIEF COMPLAINT:  Chief Complaint  Patient presents with   Follow-up    Rm 12. Patient with wife. Reports some involuntary movements at night, not sure if they are seizures or not.    INTERVAL HISTORY 05/04/2023 Lawrence [name] presents today for follow-up, he is accompanied by wife.  Last visit was in March and since then he has been doing well, no seizure or seizure-like activity.  He reports compliant with his medications, ran out of Lyrica 2 weeks ago, therefore his neuropathy pain is present.  He does have a spinal cord stimulator which is also helping with the pain.  He is doing well in terms of sleep and his OSA.  No other complaints, no other concerns.   INTERVAL HISTORY 10/11/2022:  Lawrence [name] presents today for follow-up, he is accompanied by wife.  Last visit was in November and since then he has been doing well, denies any seizure or seizure-like severity.  He is compliant with the medication, Keppra Vimpat. He ins also on  Lyrica.  He reports currently taking Lyrica twice daily instead of 3 times daily.  He did have his Spinal stimulator with good control of his pain. Denies any side effect from the medication.  Reports that his sleep is OK, usually gets about 6 hrs with his CPAP machine. He does sleep during the day while watching TV but does not take scheduled naps.    INTERVAL HISTORY 07/07/22:  Lawrence [name] presents today for follow-up, he is accompanied by his sister Lawrence [name].  In term of the seizures, he denies any seizures but stated that lately has been having episodes of spasms, his arm will jerk uncontrollably and this can last up to an hour.  He is compliant with his Keppra and Vimpat.  He reports neuropathy pain is getting better after the spinal stimulator.  He is still on  Lyrica 150 mg 3 times daily.  He continues with home PT 3 days a week and also once a week he goes for [employer] classes.    He does report some issues with short-term memory, he is following with his doctors including psychiatry for depression.  Last month he was admitted to the hospital for right-sided weakness and slurred speech and his MRI was negative for any acute stroke.  His symptoms continued to improve and he was discharged home.   Denies any additional episodes of slurred speech, word finding difficulty or focal weakness. He reports last night, his leg gave out (left leg) and he fell, denies any major injury.    INTERVAL HISTORY 03/24/2022 Patient presents today with partner [name]. Since last visit in February, he has been doing well in term of seizures, on Keppra 500 mg BID until August 2nd when he had a breakthrough seizure. He was seen in the ED and Vimpat 150 mg BID. He reports increase sleepiness while on Vimpat. He reports another breakthough seizure last night but states that yesterday was a very busy and stressful day. Interval for sleep apnea, patient was seen and diagnosed with sleep apnea on CPAP but stated she had trouble using the CPAP machine at night and therefore has not been using it for quite some time. He also have a spinal cord stimulator for his peripheral neuropathy, reports that it is working very well for him.  INTERVAL HISTORY 09/10/2021:  Patient presents today for follow-up with [name], last visit was in September at that time he did stop his Keppra and did not have any jerking motion of the right arm therefore plan was to observe patient off Keppra.  Since then he has been doing fine until December 31 when he presented to the hospital due to altered mental status.  He was admitted for Toxic metabolic encephalopathy, severe sepsis in the setting of pneumonia and acute hypoxic respiratory failure.  During this time he was noted to have right arm jerking  and period of unresponsiveness.  He had a routine EEG which did not show any epileptiform discharge and no seizures.  Plan was to restart the patient on Keppra but 1000 mg twice daily.  Since discharge from the hospital patient has been reported increased daytime somnolence.  He reported he is very difficult for him to for him to stay up, he felt like a zombie and also have issue with his memory.  He still dealing with peripheral neuropathy and is on Lyrica 150 mg 3 times daily and also has severe sleep apnea.  No seizure-like activity since being home.    HISTORY OF PRESENT ILLNESS:  This is a 67 year old man with past medical history of obesity, obstructive sleep apnea, diastolic heart failure, hypertension, hyperlipidemia, diabetes who is presenting with seizure-like activity.  Patient stated that he was admitted in March of this year for pulmonary edema, acute hypoxic respiratory failure, and left lower extremity cellulitis.  During his admission he was noted to have seizure-like activity.  Wife who is present today mentioned that patient started having tremor like activity in the upper extremity lasted about 30 seconds.  He only had 1 episode.  He was evaluated for seizure-like activity.  He had MRI which was negative and EEG also which was negative.  He was started on Keppra 1000 mg twice daily.  Patient stated after discharge from the hospital he took the medication for 1 month and self discontinued.  Since leaving the hospital he has not had any seizure-like activity.  Denies any previous seizure.  Denies any family history of seizures, denies any seizures factor.  His MRI was negative for any acute stroke or chronic stroke .   Patient also report continued lower extremity pain and numbness.  He has a history of diabetic neuropathy and he is on pregabalin 100 mg 3 times daily.  His hemoglobin A1c is still elevated, last hemoglobin A1c was 8.  He is on both insulin and oral meds for his  diabetes. Other issues discussed today he is asleep.  He had he does have a diagnosis of obstructive sleep apnea he is on a CPAP machine, reported he take he uses CPAP machine but still has been getting restful sleep..  The patient reported last time he has a steep CPAP machine checked was about 3 years ago.  He has not seen a sleep doctor since   Handedness: Right handed   Seizure Type: Unclear,   Current frequency: Only once   Any injuries from seizures:   Seizure risk factors: No seizure risk factor  Previous ASMs: Levetiracetam, Lacosamide   Currenty ASMs: Levetiracetam, Lacosamide   ASMs side effects: None   Brain Images: Normal MRI   Previous EEGs: Normal EEG   OTHER MEDICAL CONDITIONS: Obesity, OSA, HTN, DM, HLD, COPD, Diabetic neuropathy, Diastolic heart failure   REVIEW OF SYSTEMS: Full 14 system review of systems performed and negative with exception  of: as noted in the HPI  ALLERGIES: Allergies  Allergen Reactions   Vancomycin Other (See Comments)    "Red Man Syndrome" 02/02/17: possible cause for rash under both arms   Niacin And Related Other (See Comments)    Red man syndrome   Tubersol [Tuberculin, Ppd] Other (See Comments)    Reaction unknown   Doxycycline Rash and Other (See Comments)    HOME MEDICATIONS: Outpatient Medications Prior to Visit  Medication Sig Dispense Refill   acetaminophen (TYLENOL) 650 MG CR tablet Take 1,300 mg by mouth every 8 (eight) hours as needed for pain.     albuterol (VENTOLIN HFA) 108 (90 Base) MCG/ACT inhaler Inhale 2 puffs into the lungs every 6 (six) hours as needed for wheezing or shortness of breath. 1 each 6   Alcohol Swabs (ALCOHOL PREP) 70 % PADS 6 Pads by Does not apply route daily. 200 each 0   allopurinol (ZYLOPRIM) 100 MG tablet Take 200 mg by mouth 2 (two) times daily.     amoxicillin (AMOXIL) 500 MG tablet Take 4 tablets (2,000 mg total) by mouth as directed. 1 hour prior to dental work including cleanings 12  tablet 12   apixaban (ELIQUIS) 5 MG TABS tablet Take 1 tablet (5 mg total) by mouth 2 (two) times daily. 180 tablet 3   ARIPiprazole (ABILIFY) 5 MG tablet TAKE ONE TABLET BY MOUTH EVERYDAY AT BEDTIME 90 tablet 0   atorvastatin (LIPITOR) 40 MG tablet Take 1 tablet (40 mg total) by mouth daily. 90 tablet 3   busPIRone (BUSPAR) 10 MG tablet Take 1 tablet (10 mg total) by mouth 3 (three) times daily. 270 tablet 0   cephALEXin (KEFLEX) 500 MG capsule Take 1 capsule (500 mg total) by mouth 4 (four) times daily. 20 capsule 0   cetirizine (ZYRTEC) 10 MG tablet Take 10 mg by mouth at bedtime.      Cholecalciferol (VITAMIN D3 PO) Take 2,000 Units by mouth daily.     dapagliflozin propanediol (FARXIGA) 10 MG TABS tablet TAKE ONE TABLET BY MOUTH BEFORE BREAKFAST 90 tablet 3   diclofenac Sodium (VOLTAREN) 1 % GEL Apply 4 g topically 4 (four) times daily. To right knee. 100 g 0   fenofibrate (TRICOR) 145 MG tablet Take 1 tablet (145 mg total) by mouth daily. 90 tablet 3   ferrous sulfate 324 (65 Fe) MG TBEC Take 324 mg by mouth daily with breakfast.      fluticasone (FLONASE) 50 MCG/ACT nasal spray Place 2 sprays into both nostrils daily.      Fluticasone-Umeclidin-Vilant (TRELEGY ELLIPTA) 200-62.5-25 MCG/ACT AEPB INHALE 1 PUFF BY MOUTH INTO LUNGS ONCE DAILY 60 each 11   icosapent Ethyl (VASCEPA) 1 g capsule Take 2 capsules (2 g total) by mouth 2 (two) times daily. 120 capsule 3   insulin aspart (NOVOLOG FLEXPEN) 100 UNIT/ML FlexPen INJECT 12 UNITS BEFORE MEALS, keep sugar TWO HOURS AFTER meals UNDER 180 AT least 15 mL 3   Insulin Pen Needle (B-D ULTRAFINE III SHORT PEN) 31G X 8 MM MISC Inject 1 each into the skin 3 (three) times daily before meals. Use as instructed 4X daily. 100 each 5   metFORMIN (GLUCOPHAGE-XR) 500 MG 24 hr tablet Take 4 tablets (2,000 mg total) by mouth daily. 360 tablet 3   metoprolol succinate (TOPROL-XL) 50 MG 24 hr tablet Take 1 tablet (50 mg total) by mouth daily. 90 tablet 3    mirtazapine (REMERON) 15 MG tablet Take 1 tablet (15 mg total) by mouth  at bedtime. 30 tablet 0   omeprazole (PRILOSEC) 20 MG capsule Take 20 mg by mouth every morning.     potassium chloride SA (KLOR-CON M) 20 MEQ tablet Take 2 tablets (40 mEq total) by mouth 2 (two) times daily. 360 tablet 3   prazosin (MINIPRESS) 2 MG capsule Take 1 capsule (2 mg total) by mouth at bedtime.     repaglinide (PRANDIN) 2 MG tablet TAKE TWO TABLETS BY MOUTH TWICE DAILY BEFORE A meal 120 tablet 0   spironolactone (ALDACTONE) 25 MG tablet Take 0.5 tablets (12.5 mg total) by mouth daily. 45 tablet 3   tirzepatide (MOUNJARO) 15 MG/0.5ML Pen Inject 15 mg into the skin once a week. 6 mL 0   torsemide (DEMADEX) 20 MG tablet Take 6 tablets (120 mg total) by mouth in the morning and at bedtime. 1080 tablet 3   traZODone (DESYREL) 100 MG tablet Take 2 tablets (200 mg total) by mouth at bedtime. 180 tablet 0   TRESIBA FLEXTOUCH 100 UNIT/ML FlexTouch Pen INJECT 40 UNITS into THE SKIN ONCE DAILY 15 mL 2   venlafaxine XR (EFFEXOR-XR) 75 MG 24 hr capsule Take 3 capsules (225 mg total) by mouth daily with breakfast. 270 capsule 0   Lacosamide 150 MG TABS Take 1 tablet (150 mg total) by mouth 2 (two) times daily. 180 tablet 3   levETIRAcetam (KEPPRA) 500 MG tablet Take 1 tablet (500 mg total) by mouth 2 (two) times daily. 180 tablet 3   pregabalin (LYRICA) 150 MG capsule Take 1 capsule (150 mg total) by mouth 3 (three) times daily. 270 capsule 3   tirzepatide (MOUNJARO) 12.5 MG/0.5ML Pen Inject 12.5 mg into the skin once a week. 6 mL 0   No facility-administered medications prior to visit.    PAST MEDICAL HISTORY: Past Medical History:  Diagnosis Date   Acquired dilation of ascending aorta and aortic root (HCC)    40mm by echo 01/2021   Adenomatous colon polyp 2007   Anemia    Anxiety    Asthma    BPH without obstruction/lower urinary tract symptoms 02/22/2017   Chronic diastolic (congestive) heart failure (HCC)     Chronic venous stasis 03/07/2019   COPD (chronic obstructive pulmonary disease) (HCC)    Coronary artery calcification seen on CAT scan    Depression    Diabetic neuropathy (HCC) 09/11/2019   History of colon polyps 08/24/2018   Hypertension    Morbid obesity (HCC)    OSA (obstructive sleep apnea)    Pain due to onychomycosis of toenails of both feet 09/11/2019   Peripheral neuropathy 02/22/2017   Primary osteoarthritis, left shoulder 03/05/2017   PTSD (post-traumatic stress disorder)    Pure hypercholesterolemia    QT prolongation 03/07/2019   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 Lynnette Caffey and Dr. Laneta Simmers   Seizures [facility])    Severe aortic stenosis    Sinus tachycardia 03/07/2019   Sleep apnea    CPAP   Type 2 diabetes mellitus with vascular disease (HCC) 09/11/2019    PAST SURGICAL HISTORY: Past Surgical History:  Procedure Laterality Date   ENDOVENOUS ABLATION SAPHENOUS VEIN W/ LASER Right 08/20/2020   endovenous laser ablation right greater saphenous vein by Cari Caraway MD    ENDOVENOUS ABLATION SAPHENOUS VEIN W/ LASER Left 11/16/2022   endovenous laser ablation left greater saphenous vein by Cari Caraway MD   INTRAOPERATIVE TRANSTHORACIC ECHOCARDIOGRAM N/A 02/07/2023   Procedure: INTRAOPERATIVE TRANSTHORACIC ECHOCARDIOGRAM;  Surgeon: Orbie Pyo, MD;  [facility] INVASIVE CV LAB;  Service: Open Heart Surgery;  Laterality: N/A;   JOINT REPLACEMENT     left knee replacement x 2   KNEE ARTHROSCOPY Bilateral    LEFT HEART CATH AND CORONARY ANGIOGRAPHY N/A 01/17/2018   Procedure: LEFT HEART CATH AND CORONARY ANGIOGRAPHY;  Surgeon: Marykay Lex, MD;  Location: [facility] INVASIVE CV LAB;  Service: Cardiovascular;  Laterality: N/A;   PRESSURE SENSOR/CARDIOMEMS N/A 08/26/2021   Procedure: PRESSURE SENSOR/CARDIOMEMS;  Surgeon: Laurey Morale, MD;  Location: [facility] INVASIVE CV LAB;  Service: Cardiovascular;   Laterality: N/A;   RIGHT HEART CATH N/A 08/26/2021   Procedure: RIGHT HEART CATH;  Surgeon: Laurey Morale, MD;  Location: [facility] INVASIVE CV LAB;  Service: Cardiovascular;  Laterality: N/A;   RIGHT HEART CATH AND CORONARY ANGIOGRAPHY N/A 01/26/2023   Procedure: RIGHT HEART CATH AND CORONARY ANGIOGRAPHY;  Surgeon: Orbie Pyo, MD;  Location: [facility] INVASIVE CV LAB;  Service: Cardiovascular;  Laterality: N/A;   TEE WITHOUT CARDIOVERSION N/A 08/26/2021   Procedure: TRANSESOPHAGEAL ECHOCARDIOGRAM (TEE);  Surgeon: Laurey Morale, MD;  Location: [facility] ENDOSCOPY;  Service: Cardiovascular;  Laterality: N/A;   TOOTH EXTRACTION N/A 02/03/2023   Procedure: DENTAL RESTORATION/EXTRACTIONS;  Surgeon: [name], DMD;  Location: [facility] OR;  Service: Oral Surgery;  Laterality: N/A;   TRANSCATHETER AORTIC VALVE REPLACEMENT, TRANSFEMORAL Right 02/07/2023   Procedure: Transcatheter Aortic Valve Replacement, Transfemoral;  Surgeon: Orbie Pyo, MD;  Location: [facility] INVASIVE CV LAB;  Service: Open Heart Surgery;  Laterality: Right;   UMBILICAL HERNIA REPAIR      FAMILY HISTORY: Family History  Problem Relation Age of Onset   CAD Maternal Grandfather    Diabetes Other    Diabetes Mellitus II Neg Hx    Colon cancer Neg Hx    Esophageal cancer Neg Hx    Inflammatory bowel disease Neg Hx    Liver disease Neg Hx    Pancreatic cancer Neg Hx    Rectal cancer Neg Hx    Stomach cancer Neg Hx    Sleep apnea Neg Hx     SOCIAL HISTORY: Social History   Socioeconomic History   Marital status: Significant Other    Spouse name: [name]   Number of children: 1   Years of education: Not on file   Highest education level: Associate degree: academic program  Occupational History   Occupation: retired    Comment: Building control surveyor  Tobacco Use   Smoking status: Former    Current packs/day: 0.00    Average packs/day: 1 pack/day for 44.0 years (44.0 ttl pk-yrs)    Types: Cigarettes    Start date: 03/07/1972     Quit date: 03/07/2016    Years since quitting: 7.1    Passive exposure: Past   Smokeless tobacco: Never  Vaping Use   Vaping status: Never Used  Substance and Sexual Activity   Alcohol use: Yes    Comment: rare   Drug use: No   Sexual activity: Not on file  Other Topics Concern   Not on file  Social History Narrative   Lives with wife [name]   Right Handed   Drinks 1-2 cups caffeine   Social Determinants of Health   Financial Resource Strain: Medium Risk (12/06/2021)   Received from [facility], [facility]   Overall Financial Resource Strain (CARDIA)    Difficulty of Paying Living Expenses: Somewhat hard  Food Insecurity: Food Insecurity Present (01/23/2023)   Hunger Vital  Sign    Worried About Programme researcher, broadcasting/film/video in the Last Year: Sometimes true    Ran Out of Food in the Last Year: Sometimes true  Transportation Needs: No Transportation Needs (01/23/2023)   PRAPARE - Administrator, Civil Service (Medical): No    Lack of Transportation (Non-Medical): No  Physical Activity: Unknown (12/06/2021)   Received from [facility], [facility]   Exercise Vital Sign    Days of Exercise per Week: Patient declined    Minutes of Exercise per Session: 10 min  Stress: No Stress Concern Present (01/21/2022)   Received from [facility], [facility] of Occupational Health - Occupational Stress Questionnaire    Feeling of Stress : Only a little  Recent Concern: Stress - Stress Concern Present (11/19/2021)   Received from [facility] of Occupational Health - Occupational Stress Questionnaire    Feeling of Stress : To some extent  Social Connections: Unknown (12/18/2022)   Received from [facility], [facility]   Social Network    Social Network: Not on file  Intimate Partner Violence: Unknown (01/23/2023)   Humiliation, Afraid, Rape, and Kick questionnaire    Fear of Current or Ex-Partner: No    Emotionally Abused: No     Physically Abused: Patient unable to answer    Sexually Abused: No     PHYSICAL EXAM  GENERAL EXAM/CONSTITUTIONAL: Vitals:  Vitals:   05/04/23 0831  BP: 108/72  Pulse: 84  Weight: (!) 342 lb (155.1 kg)  Height: 6\' 2"  (1.88 m)     Body mass index is 43.91 kg/m. Wt Readings from Last 3 Encounters:  05/04/23 (!) 342 lb (155.1 kg)  03/10/23 (!) 359 lb (162.8 kg)  02/16/23 (!) 358 lb (162.4 kg)   Patient is in no distress; well developed, nourished and groomed; neck is supple. He is morbidly obese.  MUSCULOSKELETAL: Gait, strength, tone, movements noted in Neurologic exam below  NEUROLOGIC: MENTAL STATUS:  awake, alert, oriented to person, place and time recent and remote memory intact normal attention and concentration language fluent, comprehension intact, naming intact fund of knowledge appropriate  CRANIAL NERVE:  2nd, 3rd, 4th, 6th -visual fields full to confrontation, extraocular muscles intact, no nystagmus 5th - facial sensation symmetric 7th - facial strength symmetric 8th - hearing intact 9th - palate elevates symmetrically, uvula midline 11th - shoulder shrug symmetric 12th - tongue protrusion midline  MOTOR:  normal bulk and tone, at least antigravity in the BUE and BLE.  SENSORY:  Decrease sensation to light touch  COORDINATION:  finger-nose-finger, fine finger movements normal  GAIT/STATION:  Deferred    DIAGNOSTIC DATA (LABS, IMAGING, TESTING) - I reviewed patient records, labs, notes, testing and imaging myself where available.  Lab Results  Component Value Date   WBC 14.2 (H) 02/08/2023   HGB 11.9 (L) 02/08/2023   HCT 37.2 (L) 02/08/2023   MCV 90.7 02/08/2023   PLT 218 02/08/2023      Component Value Date/Time   NA 136 02/15/2023 1415   K 3.9 02/15/2023 1415   CL 91 (L) 02/15/2023 1415   CO2 26 02/15/2023 1415   GLUCOSE 146 (H) 02/15/2023 1415   GLUCOSE 220 (H) 02/08/2023 0053   BUN 37 (H) 02/15/2023 1415   CREATININE 1.06  02/15/2023 1415   CALCIUM 8.9 02/15/2023 1415   PROT 5.9 (L) 02/07/2023 0648   ALBUMIN 3.0 (L) 02/07/2023 0648   AST 13 (L) 02/07/2023 4098  ALT 18 02/07/2023 0648   ALKPHOS 52 02/07/2023 0648   BILITOT 0.8 02/07/2023 0648   GFRNONAA >60 02/08/2023 0053   GFRAA 77 05/20/2020 1038   Lab Results  Component Value Date   CHOL 139 12/05/2022   HDL 31 (L) 12/05/2022   LDLCALC 60 12/05/2022   TRIG 239 (H) 12/05/2022   Lab Results  Component Value Date   HGBA1C 6.4 (H) 02/07/2023   Lab Results  Component Value Date   VITAMINB12 223 05/26/2022   Lab Results  Component Value Date   TSH 1.182 05/26/2022   MRI Brain 05/26/22 Normal brain MRI for age. No acute intracranial infarct or other abnormality.  MRI Brain 02/08/2021 No evidence of recent infarction, hemorrhage, or mass. No significant change since recent prior study.  EEG 01/26/2021: Normal routine EEG  EEG 04/27/2021: This study is within normal limits. No seizures or epileptiform discharges were seen throughout the recording. If suspicion for ictal-interictal activity remains a concern, a prolonged study can  be considered.   EEG 08/09/2021: This study is within normal limits. No seizures or epileptiform discharges were seen throughout the recording.   I personally reviewed brain Images and previous EEG reports.   ASSESSMENT AND PLAN  67 y.o. year old male  with morbid obesity, hypertension, hyperlipidemia, diabetes mellitus type 2, diabetes neuropathy, diastolic heart failure, seizure disorder who is presenting for seizure follow up.  No seizures since last visit, currently doing well on Vimpat 150 mg twice daily, Keppra 500 mg twice daily and Lyrica 150 mg three times daily. We will continue current medications, encourage patient to continue with weight loss, to remain active and to continue his CPAP at night, also. He voices understanding. I will see him in 1 year for follow up.     1. Seizure disorder (HCC)   2.  Diabetic peripheral neuropathy (HCC)   3. OSA on CPAP   4. Morbid obesity with BMI of 45.0-49.9, adult (HCC)   5. B12 deficiency     Patient Instructions  Continue current medications, Refill given Continue follow-up with your doctors Follow-up in 1 year or sooner if worse.   Per [facility] statutes, patients with seizures are not allowed to drive until they have been seizure-free for six months.  Other recommendations include using caution when using heavy equipment or power tools. Avoid working on ladders or at heights. Take showers instead of baths.  Do not swim alone.  Ensure the water temperature is not too high on the home water heater. Do not go swimming alone. Do not lock yourself in a room alone (i.e. bathroom). When caring for infants or small children, sit down when holding, feeding, or changing them to minimize risk of injury to the child in the event you have a seizure. Maintain good sleep hygiene. Avoid alcohol.  Also recommend adequate sleep, hydration, good diet and minimize stress.   During the Seizure  - First, ensure adequate ventilation and place patients on the floor on their left side  Loosen clothing around the neck and ensure the airway is patent. If the patient is clenching the teeth, do not force the mouth open with any object as this can cause severe damage - Remove all items from the surrounding that can be hazardous. The patient may be oblivious to what's happening and may not even know what he or she is doing. If the patient is confused and wandering, either gently guide him/her away and block access to outside areas - Reassure  the individual and be comforting - Call 911. In most cases, the seizure ends before EMS arrives. However, there are cases when seizures may last over 3 to 5 minutes. Or the individual may have developed breathing difficulties or severe injuries. If a pregnant patient or a person with diabetes develops a seizure, it is prudent to call  an ambulance. - Finally, if the patient does not regain full consciousness, then call EMS. Most patients will remain confused for about 45 to 90 minutes after a seizure, so you must use judgment in calling for help. - Avoid restraints but make sure the patient is in a bed with padded side rails - Place the individual in a lateral position with the neck slightly flexed; this will help the saliva drain from the mouth and prevent the tongue from falling backward - Remove all nearby furniture and other hazards from the area - Provide verbal assurance as the individual is regaining consciousness - Provide the patient with privacy if possible - Call for help and start treatment as ordered by the caregiver   After the Seizure (Postictal Stage)  After a seizure, most patients experience confusion, fatigue, muscle pain and/or a headache. Thus, one should permit the individual to sleep. For the next few days, reassurance is essential. Being calm and helping reorient the person is also of importance.  Most seizures are painless and end spontaneously. Seizures are not harmful to others but can lead to complications such as stress on the lungs, brain and the heart. Individuals with prior lung problems may develop labored breathing and respiratory distress.     No orders of the defined types were placed in this encounter.    Meds ordered this encounter  Medications   Lacosamide 150 MG TABS    Sig: Take 1 tablet (150 mg total) by mouth 2 (two) times daily.    Dispense:  180 tablet    Refill:  3    This prescription reflects the most recent change. Please void all other prescriptions on file.   pregabalin (LYRICA) 150 MG capsule    Sig: Take 1 capsule (150 mg total) by mouth 3 (three) times daily.    Dispense:  270 capsule    Refill:  3   levETIRAcetam (KEPPRA) 500 MG tablet    Sig: Take 1 tablet (500 mg total) by mouth 2 (two) times daily.    Dispense:  180 tablet    Refill:  3   cyanocobalamin  (VITAMIN B12) 1000 MCG tablet    Sig: Take 1 tablet (1,000 mcg total) by mouth daily.    Dispense:  90 tablet    Refill:  3    Return in about 1 year (around 05/03/2024).    Windell Norfolk, MD 05/04/2023, 8:57 AM  [facility] [address], [address] [place], [place] [address] [phone]

## 2023-05-06 DIAGNOSIS — R4781 Slurred speech: Secondary | ICD-10-CM | POA: Diagnosis not present

## 2023-05-06 DIAGNOSIS — R001 Bradycardia, unspecified: Secondary | ICD-10-CM | POA: Diagnosis not present

## 2023-05-06 DIAGNOSIS — R531 Weakness: Secondary | ICD-10-CM | POA: Diagnosis not present

## 2023-05-06 DIAGNOSIS — G8190 Hemiplegia, unspecified affecting unspecified side: Secondary | ICD-10-CM | POA: Diagnosis not present

## 2023-05-07 ENCOUNTER — Emergency Department (HOSPITAL_COMMUNITY): Payer: No Typology Code available for payment source

## 2023-05-07 ENCOUNTER — Inpatient Hospital Stay (HOSPITAL_COMMUNITY)
Admission: EM | Admit: 2023-05-07 | Discharge: 2023-05-09 | DRG: 683 | Disposition: A | Discharge status: Home Health | Payer: No Typology Code available for payment source | Attending: Internal Medicine | Admitting: Internal Medicine

## 2023-05-07 ENCOUNTER — Other Ambulatory Visit: Payer: Self-pay

## 2023-05-07 DIAGNOSIS — I878 Other specified disorders of veins: Secondary | ICD-10-CM | POA: Diagnosis present

## 2023-05-07 DIAGNOSIS — I69354 Hemiplegia and hemiparesis following cerebral infarction affecting left non-dominant side: Secondary | ICD-10-CM

## 2023-05-07 DIAGNOSIS — E7800 Pure hypercholesterolemia, unspecified: Secondary | ICD-10-CM | POA: Diagnosis present

## 2023-05-07 DIAGNOSIS — I2510 Atherosclerotic heart disease of native coronary artery without angina pectoris: Secondary | ICD-10-CM | POA: Diagnosis present

## 2023-05-07 DIAGNOSIS — Z7901 Long term (current) use of anticoagulants: Secondary | ICD-10-CM

## 2023-05-07 DIAGNOSIS — F4310 Post-traumatic stress disorder, unspecified: Secondary | ICD-10-CM | POA: Diagnosis present

## 2023-05-07 DIAGNOSIS — I639 Cerebral infarction, unspecified: Secondary | ICD-10-CM | POA: Diagnosis not present

## 2023-05-07 DIAGNOSIS — G459 Transient cerebral ischemic attack, unspecified: Secondary | ICD-10-CM

## 2023-05-07 DIAGNOSIS — R079 Chest pain, unspecified: Principal | ICD-10-CM | POA: Diagnosis present

## 2023-05-07 DIAGNOSIS — R5383 Other fatigue: Secondary | ICD-10-CM | POA: Diagnosis not present

## 2023-05-07 DIAGNOSIS — I5032 Chronic diastolic (congestive) heart failure: Secondary | ICD-10-CM | POA: Diagnosis present

## 2023-05-07 DIAGNOSIS — E119 Type 2 diabetes mellitus without complications: Secondary | ICD-10-CM | POA: Diagnosis present

## 2023-05-07 DIAGNOSIS — I493 Ventricular premature depolarization: Secondary | ICD-10-CM | POA: Diagnosis present

## 2023-05-07 DIAGNOSIS — Z5986 Financial insecurity: Secondary | ICD-10-CM

## 2023-05-07 DIAGNOSIS — Z953 Presence of xenogenic heart valve: Secondary | ICD-10-CM

## 2023-05-07 DIAGNOSIS — G40909 Epilepsy, unspecified, not intractable, without status epilepticus: Secondary | ICD-10-CM

## 2023-05-07 DIAGNOSIS — N400 Enlarged prostate without lower urinary tract symptoms: Secondary | ICD-10-CM | POA: Diagnosis present

## 2023-05-07 DIAGNOSIS — R531 Weakness: Secondary | ICD-10-CM | POA: Diagnosis not present

## 2023-05-07 DIAGNOSIS — Z79899 Other long term (current) drug therapy: Secondary | ICD-10-CM

## 2023-05-07 DIAGNOSIS — R4701 Aphasia: Secondary | ICD-10-CM | POA: Diagnosis not present

## 2023-05-07 DIAGNOSIS — Z7951 Long term (current) use of inhaled steroids: Secondary | ICD-10-CM

## 2023-05-07 DIAGNOSIS — Z8249 Family history of ischemic heart disease and other diseases of the circulatory system: Secondary | ICD-10-CM

## 2023-05-07 DIAGNOSIS — Z794 Long term (current) use of insulin: Secondary | ICD-10-CM

## 2023-05-07 DIAGNOSIS — I517 Cardiomegaly: Secondary | ICD-10-CM | POA: Diagnosis not present

## 2023-05-07 DIAGNOSIS — Z833 Family history of diabetes mellitus: Secondary | ICD-10-CM

## 2023-05-07 DIAGNOSIS — J449 Chronic obstructive pulmonary disease, unspecified: Secondary | ICD-10-CM | POA: Diagnosis present

## 2023-05-07 DIAGNOSIS — I110 Hypertensive heart disease with heart failure: Secondary | ICD-10-CM | POA: Diagnosis present

## 2023-05-07 DIAGNOSIS — I480 Paroxysmal atrial fibrillation: Secondary | ICD-10-CM | POA: Diagnosis present

## 2023-05-07 DIAGNOSIS — E1169 Type 2 diabetes mellitus with other specified complication: Secondary | ICD-10-CM | POA: Diagnosis present

## 2023-05-07 DIAGNOSIS — Z87891 Personal history of nicotine dependence: Secondary | ICD-10-CM

## 2023-05-07 DIAGNOSIS — E1140 Type 2 diabetes mellitus with diabetic neuropathy, unspecified: Secondary | ICD-10-CM | POA: Diagnosis present

## 2023-05-07 DIAGNOSIS — G4733 Obstructive sleep apnea (adult) (pediatric): Secondary | ICD-10-CM | POA: Diagnosis present

## 2023-05-07 DIAGNOSIS — E876 Hypokalemia: Secondary | ICD-10-CM | POA: Diagnosis present

## 2023-05-07 DIAGNOSIS — Z66 Do not resuscitate: Secondary | ICD-10-CM | POA: Diagnosis present

## 2023-05-07 DIAGNOSIS — Z881 Allergy status to other antibiotic agents status: Secondary | ICD-10-CM

## 2023-05-07 DIAGNOSIS — I69320 Aphasia following cerebral infarction: Secondary | ICD-10-CM

## 2023-05-07 DIAGNOSIS — N179 Acute kidney failure, unspecified: Principal | ICD-10-CM | POA: Diagnosis present

## 2023-05-07 DIAGNOSIS — Z7984 Long term (current) use of oral hypoglycemic drugs: Secondary | ICD-10-CM

## 2023-05-07 DIAGNOSIS — Z888 Allergy status to other drugs, medicaments and biological substances status: Secondary | ICD-10-CM

## 2023-05-07 DIAGNOSIS — Z952 Presence of prosthetic heart valve: Secondary | ICD-10-CM

## 2023-05-07 DIAGNOSIS — Z7985 Long-term (current) use of injectable non-insulin antidiabetic drugs: Secondary | ICD-10-CM

## 2023-05-07 DIAGNOSIS — E860 Dehydration: Secondary | ICD-10-CM | POA: Diagnosis present

## 2023-05-07 DIAGNOSIS — Z96652 Presence of left artificial knee joint: Secondary | ICD-10-CM | POA: Diagnosis present

## 2023-05-07 DIAGNOSIS — M19012 Primary osteoarthritis, left shoulder: Secondary | ICD-10-CM | POA: Diagnosis present

## 2023-05-07 DIAGNOSIS — I6782 Cerebral ischemia: Secondary | ICD-10-CM | POA: Diagnosis not present

## 2023-05-07 DIAGNOSIS — J4489 Other specified chronic obstructive pulmonary disease: Secondary | ICD-10-CM | POA: Diagnosis present

## 2023-05-07 DIAGNOSIS — I6523 Occlusion and stenosis of bilateral carotid arteries: Secondary | ICD-10-CM | POA: Diagnosis not present

## 2023-05-07 DIAGNOSIS — Z6841 Body Mass Index (BMI) 40.0 and over, adult: Secondary | ICD-10-CM

## 2023-05-07 DIAGNOSIS — I10 Essential (primary) hypertension: Secondary | ICD-10-CM | POA: Diagnosis present

## 2023-05-07 DIAGNOSIS — I6529 Occlusion and stenosis of unspecified carotid artery: Secondary | ICD-10-CM | POA: Diagnosis not present

## 2023-05-07 LAB — CBC
HCT: 44.3 % (ref 39.0–52.0)
Hemoglobin: 14.4 g/dL (ref 13.0–17.0)
MCH: 27.9 pg (ref 26.0–34.0)
MCHC: 32.5 g/dL (ref 30.0–36.0)
MCV: 85.9 fL (ref 80.0–100.0)
Platelets: 303 10*3/uL (ref 150–400)
RBC: 5.16 MIL/uL (ref 4.22–5.81)
RDW: 15.2 % (ref 11.5–15.5)
WBC: 14.9 10*3/uL — ABNORMAL HIGH (ref 4.0–10.5)
nRBC: 0 % (ref 0.0–0.2)

## 2023-05-07 LAB — I-STAT CHEM 8, ED
BUN: 49 mg/dL — ABNORMAL HIGH (ref 8–23)
Calcium, Ion: 1.07 mmol/L — ABNORMAL LOW (ref 1.15–1.40)
Chloride: 98 mmol/L (ref 98–111)
Creatinine, Ser: 1.7 mg/dL — ABNORMAL HIGH (ref 0.61–1.24)
Glucose, Bld: 139 mg/dL — ABNORMAL HIGH (ref 70–99)
HCT: 47 % (ref 39.0–52.0)
Hemoglobin: 16 g/dL (ref 13.0–17.0)
Potassium: 2.6 mmol/L — CL (ref 3.5–5.1)
Sodium: 138 mmol/L (ref 135–145)
TCO2: 26 mmol/L (ref 22–32)

## 2023-05-07 LAB — GLUCOSE, CAPILLARY
Glucose-Capillary: 170 mg/dL — ABNORMAL HIGH (ref 70–99)
Glucose-Capillary: 249 mg/dL — ABNORMAL HIGH (ref 70–99)
Glucose-Capillary: 177 mg/dL — ABNORMAL HIGH (ref 70–99)

## 2023-05-07 LAB — COMPREHENSIVE METABOLIC PANEL
ALT: 17 U/L (ref 0–44)
AST: 24 U/L (ref 15–41)
Albumin: 3.2 g/dL — ABNORMAL LOW (ref 3.5–5.0)
Alkaline Phosphatase: 79 U/L (ref 38–126)
Anion gap: 16 — ABNORMAL HIGH (ref 5–15)
BUN: 44 mg/dL — ABNORMAL HIGH (ref 8–23)
CO2: 23 mmol/L (ref 22–32)
Calcium: 8.4 mg/dL — ABNORMAL LOW (ref 8.9–10.3)
Chloride: 96 mmol/L — ABNORMAL LOW (ref 98–111)
Creatinine, Ser: 1.54 mg/dL — ABNORMAL HIGH (ref 0.61–1.24)
GFR, Estimated: 49 mL/min — ABNORMAL LOW (ref >60)
Glucose, Bld: 127 mg/dL — ABNORMAL HIGH (ref 70–99)
Potassium: 2.5 mmol/L — CL (ref 3.5–5.1)
Sodium: 135 mmol/L (ref 135–145)
Total Bilirubin: 0.9 mg/dL (ref 0.3–1.2)
Total Protein: 6.1 g/dL — ABNORMAL LOW (ref 6.5–8.1)

## 2023-05-07 LAB — DIFFERENTIAL
Abs Immature Granulocytes: 0.1 10*3/uL — ABNORMAL HIGH (ref 0.00–0.07)
Basophils Absolute: 0.1 10*3/uL (ref 0.0–0.1)
Basophils Relative: 1 %
Eosinophils Absolute: 0.6 10*3/uL — ABNORMAL HIGH (ref 0.0–0.5)
Eosinophils Relative: 4 %
Immature Granulocytes: 1 %
Lymphocytes Relative: 19 %
Lymphs Abs: 2.8 10*3/uL (ref 0.7–4.0)
Monocytes Absolute: 1 10*3/uL (ref 0.1–1.0)
Monocytes Relative: 7 %
Neutro Abs: 10.2 10*3/uL — ABNORMAL HIGH (ref 1.7–7.7)
Neutrophils Relative %: 68 %

## 2023-05-07 LAB — BASIC METABOLIC PANEL
Anion gap: 18 — ABNORMAL HIGH (ref 5–15)
BUN: 40 mg/dL — ABNORMAL HIGH (ref 8–23)
CO2: 28 mmol/L (ref 22–32)
Calcium: 9.1 mg/dL (ref 8.9–10.3)
Chloride: 93 mmol/L — ABNORMAL LOW (ref 98–111)
Creatinine, Ser: 1.29 mg/dL — ABNORMAL HIGH (ref 0.61–1.24)
GFR, Estimated: >60 mL/min (ref >60)
Glucose, Bld: 146 mg/dL — ABNORMAL HIGH (ref 70–99)
Potassium: 2.9 mmol/L — ABNORMAL LOW (ref 3.5–5.1)
Sodium: 139 mmol/L (ref 135–145)

## 2023-05-07 LAB — TROPONIN I (HIGH SENSITIVITY)
Troponin I (High Sensitivity): 11 ng/L (ref <18)
Troponin I (High Sensitivity): 9 ng/L (ref <18)

## 2023-05-07 LAB — RAPID URINE DRUG SCREEN, HOSP PERFORMED
Amphetamines: NOT DETECTED
Barbiturates: NOT DETECTED
Benzodiazepines: NOT DETECTED
Cocaine: NOT DETECTED
Opiates: NOT DETECTED
Tetrahydrocannabinol: NOT DETECTED

## 2023-05-07 LAB — APTT: aPTT: 29 s (ref 24–36)

## 2023-05-07 LAB — PROTIME-INR
INR: 1 (ref 0.8–1.2)
Prothrombin Time: 13.6 s (ref 11.4–15.2)

## 2023-05-07 LAB — ETHANOL: Alcohol, Ethyl (B): <10 mg/dL (ref <10)

## 2023-05-07 MED ORDER — ATORVASTATIN CALCIUM 40 MG PO TABS
40.0000 mg | ORAL_TABLET | Freq: Every day | ORAL | Status: DC
  Administered 2023-05-07 – 2023-05-09 (×3): 40 mg via ORAL
  Filled 2023-05-07 (×3): qty 1

## 2023-05-07 MED ORDER — PRAZOSIN HCL 2 MG PO CAPS
2.0000 mg | ORAL_CAPSULE | Freq: Every day | ORAL | Status: DC
  Administered 2023-05-07 – 2023-05-08 (×2): 2 mg via ORAL
  Filled 2023-05-07 (×2): qty 1

## 2023-05-07 MED ORDER — FERROUS SULFATE 325 (65 FE) MG PO TABS
324.0000 mg | ORAL_TABLET | Freq: Every day | ORAL | Status: DC
  Administered 2023-05-07 – 2023-05-09 (×3): 324 mg via ORAL
  Filled 2023-05-07 (×3): qty 1

## 2023-05-07 MED ORDER — ONDANSETRON HCL 4 MG PO TABS: 4.0000 mg | ORAL_TABLET | Freq: Four times a day (QID) | ORAL | Status: DC | PRN

## 2023-05-07 MED ORDER — ONDANSETRON HCL 4 MG/2ML IJ SOLN: 4.0000 mg | Freq: Four times a day (QID) | INTRAMUSCULAR | Status: DC | PRN

## 2023-05-07 MED ORDER — INSULIN DEGLUDEC 100 UNIT/ML ~~LOC~~ SOPN: 20.0000 [IU] | PEN_INJECTOR | SUBCUTANEOUS | Status: DC

## 2023-05-07 MED ORDER — LORAZEPAM 1 MG PO TABS
1.0000 mg | ORAL_TABLET | Freq: Once | ORAL | Status: AC
  Administered 2023-05-07: 1 mg via ORAL
  Filled 2023-05-07: qty 1

## 2023-05-07 MED ORDER — INSULIN ASPART 100 UNIT/ML IJ SOLN: 0.0000 [IU] | Freq: Every day | INTRAMUSCULAR | Status: DC

## 2023-05-07 MED ORDER — METOPROLOL SUCCINATE ER 50 MG PO TB24
50.0000 mg | ORAL_TABLET | Freq: Every day | ORAL | Status: DC
  Administered 2023-05-07 – 2023-05-09 (×3): 50 mg via ORAL
  Filled 2023-05-07 (×3): qty 1

## 2023-05-07 MED ORDER — ALLOPURINOL 100 MG PO TABS
200.0000 mg | ORAL_TABLET | Freq: Two times a day (BID) | ORAL | Status: DC
  Administered 2023-05-07 – 2023-05-09 (×5): 200 mg via ORAL
  Filled 2023-05-07 (×5): qty 2

## 2023-05-07 MED ORDER — ACETAMINOPHEN 325 MG PO TABS
650.0000 mg | ORAL_TABLET | Freq: Four times a day (QID) | ORAL | Status: DC | PRN
  Administered 2023-05-07 – 2023-05-09 (×5): 650 mg via ORAL
  Filled 2023-05-07 (×5): qty 2

## 2023-05-07 MED ORDER — POTASSIUM CHLORIDE IN NACL 20-0.9 MEQ/L-% IV SOLN
INTRAVENOUS | Status: DC
  Filled 2023-05-07 (×2): qty 1000

## 2023-05-07 MED ORDER — POTASSIUM CHLORIDE CRYS ER 20 MEQ PO TBCR
40.0000 meq | EXTENDED_RELEASE_TABLET | Freq: Once | ORAL | Status: AC
  Administered 2023-05-07: 40 meq via ORAL
  Filled 2023-05-07: qty 2

## 2023-05-07 MED ORDER — UMECLIDINIUM BROMIDE 62.5 MCG/ACT IN AEPB
1.0000 | INHALATION_SPRAY | Freq: Every day | RESPIRATORY_TRACT | Status: DC
  Administered 2023-05-08 – 2023-05-09 (×2): 1 via RESPIRATORY_TRACT
  Filled 2023-05-07: qty 7

## 2023-05-07 MED ORDER — INSULIN ASPART 100 UNIT/ML IJ SOLN
0.0000 [IU] | Freq: Three times a day (TID) | INTRAMUSCULAR | Status: DC
  Administered 2023-05-07: 5 [IU] via SUBCUTANEOUS
  Administered 2023-05-07 – 2023-05-08 (×2): 3 [IU] via SUBCUTANEOUS
  Administered 2023-05-08 (×2): 2 [IU] via SUBCUTANEOUS
  Administered 2023-05-09: 3 [IU] via SUBCUTANEOUS

## 2023-05-07 MED ORDER — VENLAFAXINE HCL ER 75 MG PO CP24
225.0000 mg | ORAL_CAPSULE | Freq: Every day | ORAL | Status: DC
  Administered 2023-05-07 – 2023-05-09 (×3): 225 mg via ORAL
  Filled 2023-05-07 (×3): qty 1

## 2023-05-07 MED ORDER — LEVETIRACETAM 500 MG PO TABS
500.0000 mg | ORAL_TABLET | Freq: Two times a day (BID) | ORAL | Status: DC
  Administered 2023-05-07 – 2023-05-09 (×5): 500 mg via ORAL
  Filled 2023-05-07 (×5): qty 1

## 2023-05-07 MED ORDER — LACOSAMIDE 50 MG PO TABS
150.0000 mg | ORAL_TABLET | Freq: Two times a day (BID) | ORAL | Status: DC
  Administered 2023-05-07 – 2023-05-09 (×5): 150 mg via ORAL
  Filled 2023-05-07 (×5): qty 3

## 2023-05-07 MED ORDER — ACETAMINOPHEN 650 MG RE SUPP: 650.0000 mg | Freq: Four times a day (QID) | RECTAL | Status: DC | PRN

## 2023-05-07 MED ORDER — INSULIN GLARGINE-YFGN 100 UNIT/ML ~~LOC~~ SOLN
20.0000 [IU] | Freq: Every day | SUBCUTANEOUS | Status: DC
Start: 2023-05-07 — End: 2023-05-09
  Administered 2023-05-07 – 2023-05-09 (×3): 20 [IU] via SUBCUTANEOUS
  Filled 2023-05-07 (×4): qty 0.2

## 2023-05-07 MED ORDER — MAGNESIUM SULFATE 2 GM/50ML IV SOLN
2.0000 g | Freq: Once | INTRAVENOUS | Status: AC
  Administered 2023-05-07: 2 g via INTRAVENOUS
  Filled 2023-05-07: qty 50

## 2023-05-07 MED ORDER — POTASSIUM CHLORIDE 10 MEQ/100ML IV SOLN
10.0000 meq | INTRAVENOUS | Status: AC
  Administered 2023-05-07 (×4): 10 meq via INTRAVENOUS
  Filled 2023-05-07 (×3): qty 100

## 2023-05-07 MED ORDER — FLUTICASONE FUROATE-VILANTEROL 200-25 MCG/ACT IN AEPB
1.0000 | INHALATION_SPRAY | Freq: Every day | RESPIRATORY_TRACT | Status: DC
  Administered 2023-05-08 – 2023-05-09 (×2): 1 via RESPIRATORY_TRACT
  Filled 2023-05-07: qty 28

## 2023-05-07 MED ORDER — PREGABALIN 75 MG PO CAPS
150.0000 mg | ORAL_CAPSULE | Freq: Three times a day (TID) | ORAL | Status: DC
  Administered 2023-05-07 – 2023-05-09 (×5): 150 mg via ORAL
  Filled 2023-05-07 (×5): qty 2

## 2023-05-07 MED ORDER — POTASSIUM CHLORIDE 10 MEQ/100ML IV SOLN
10.0000 meq | INTRAVENOUS | Status: AC
  Administered 2023-05-07 (×4): 10 meq via INTRAVENOUS
  Filled 2023-05-07 (×2): qty 100

## 2023-05-07 MED ORDER — IOHEXOL 350 MG/ML SOLN
75.0000 mL | Freq: Once | INTRAVENOUS | Status: AC | PRN
  Administered 2023-05-07: 75 mL via INTRAVENOUS

## 2023-05-07 MED ORDER — POTASSIUM CHLORIDE 10 MEQ/100ML IV SOLN
INTRAVENOUS | Status: AC
  Filled 2023-05-07: qty 100

## 2023-05-07 MED ORDER — APIXABAN 5 MG PO TABS
5.0000 mg | ORAL_TABLET | Freq: Two times a day (BID) | ORAL | Status: DC
  Administered 2023-05-07 – 2023-05-09 (×5): 5 mg via ORAL
  Filled 2023-05-07 (×2): qty 1
  Filled 2023-05-07: qty 2
  Filled 2023-05-07 (×2): qty 1

## 2023-05-07 NOTE — ED Provider Notes (Signed)
[place] EMERGENCY DEPARTMENT AT [facility] Provider Note   CSN: [id number] Arrival date & time: 05/07/23  0004  An emergency department physician performed an initial assessment on this suspected stroke patient at 0010.  History  Chief Complaint  Patient presents with   Aphasia   Chest Pain    Lawrence [name] is a 67 y.o. male.  The history is provided by the EMS personnel. The history is limited by the condition of the patient.  Cerebrovascular Accident This is a new problem. The current episode started 6 to 12 hours ago. The problem occurs constantly. The problem has been gradually improving. Associated symptoms include chest pain. Pertinent negatives include no abdominal pain and no shortness of breath. Nothing aggravates the symptoms. Nothing relieves the symptoms. He has tried nothing for the symptoms. The treatment provided mild relief.  Patient with a h/o stroke on Eliquis presents with Aphasia, facial droop. Reportedly had chest pain as well but is denying at this right now.  Is speaking but slowly.       Home Medications Prior to Admission medications   Medication Sig Start Date End Date Taking? Authorizing Provider  apixaban (ELIQUIS) 5 MG TABS tablet Take 1 tablet (5 mg total) by mouth 2 (two) times daily. 04/17/23  Yes Laurey Morale, MD  acetaminophen (TYLENOL) 650 MG CR tablet Take 1,300 mg by mouth every 8 (eight) hours as needed for pain.    [provider]  albuterol (VENTOLIN HFA) 108 (90 Base) MCG/ACT inhaler Inhale 2 puffs into the lungs every 6 (six) hours as needed for wheezing or shortness of breath. 04/17/23   Hunsucker, Lesia Sago, MD  Alcohol Swabs (ALCOHOL PREP) 70 % PADS 6 Pads by Does not apply route daily. 04/17/23   Altamese Bayside, MD  allopurinol (ZYLOPRIM) 100 MG tablet Take 200 mg by mouth 2 (two) times daily.    [provider]  amoxicillin (AMOXIL) 500 MG tablet Take 4 tablets (2,000 mg total) by mouth as directed. 1  hour prior to dental work including cleanings 02/15/23   Janetta Hora, PA-C  ARIPiprazole (ABILIFY) 5 MG tablet TAKE ONE TABLET BY MOUTH EVERYDAY AT BEDTIME 04/17/23   Mozingo, Thereasa Solo, NP  atorvastatin (LIPITOR) 40 MG tablet Take 1 tablet (40 mg total) by mouth daily. 04/17/23   Laurey Morale, MD  busPIRone (BUSPAR) 10 MG tablet Take 1 tablet (10 mg total) by mouth 3 (three) times daily. 04/17/23   Mozingo, Thereasa Solo, NP  cephALEXin (KEFLEX) 500 MG capsule Take 1 capsule (500 mg total) by mouth 4 (four) times daily. 02/08/23   Zannie Cove, MD  cetirizine (ZYRTEC) 10 MG tablet Take 10 mg by mouth at bedtime.     [provider]  Cholecalciferol (VITAMIN D3 PO) Take 2,000 Units by mouth daily.    [provider]  cyanocobalamin (VITAMIN B12) 1000 MCG tablet Take 1 tablet (1,000 mcg total) by mouth daily. 05/04/23   Windell Norfolk, MD  dapagliflozin propanediol (FARXIGA) 10 MG TABS tablet TAKE ONE TABLET BY MOUTH BEFORE BREAKFAST 04/17/23   Laurey Morale, MD  diclofenac Sodium (VOLTAREN) 1 % GEL Apply 4 g topically 4 (four) times daily. To right knee. 10/31/22   Arrien, York Ram, MD  fenofibrate (TRICOR) 145 MG tablet Take 1 tablet (145 mg total) by mouth daily. 04/17/23   Laurey Morale, MD  ferrous sulfate 324 (65 Fe) MG TBEC Take 324 mg by mouth daily with breakfast.  [provider]  fluticasone (FLONASE) 50 MCG/ACT nasal spray Place 2 sprays into both nostrils daily.     [provider]  Fluticasone-Umeclidin-Vilant (TRELEGY ELLIPTA) 200-62.5-25 MCG/ACT AEPB INHALE 1 PUFF BY MOUTH INTO LUNGS ONCE DAILY 08/18/22   Hunsucker, Lesia Sago, MD  icosapent Ethyl (VASCEPA) 1 g capsule Take 2 capsules (2 g total) by mouth 2 (two) times daily. 04/17/23   Laurey Morale, MD  insulin aspart (NOVOLOG FLEXPEN) 100 UNIT/ML FlexPen INJECT 12 UNITS BEFORE MEALS, keep sugar TWO HOURS AFTER meals UNDER 180 AT least 04/17/23   Motwani, Komal, MD  Insulin Pen  Needle (B-D ULTRAFINE III SHORT PEN) 31G X 8 MM MISC Inject 1 each into the skin 3 (three) times daily before meals. Use as instructed 4X daily. 04/17/23   Altamese Porter, MD  Lacosamide 150 MG TABS Take 1 tablet (150 mg total) by mouth 2 (two) times daily. 05/04/23   Windell Norfolk, MD  levETIRAcetam (KEPPRA) 500 MG tablet Take 1 tablet (500 mg total) by mouth 2 (two) times daily. 05/04/23   Windell Norfolk, MD  metFORMIN (GLUCOPHAGE-XR) 500 MG 24 hr tablet Take 4 tablets (2,000 mg total) by mouth daily. 04/17/23   Altamese Henderson, MD  metoprolol succinate (TOPROL-XL) 50 MG 24 hr tablet Take 1 tablet (50 mg total) by mouth daily. 04/17/23   Laurey Morale, MD  mirtazapine (REMERON) 15 MG tablet Take 1 tablet (15 mg total) by mouth at bedtime. 04/17/23   Mozingo, Thereasa Solo, NP  omeprazole (PRILOSEC) 20 MG capsule Take 20 mg by mouth every morning. 05/29/20   [provider]  potassium chloride SA (KLOR-CON M) 20 MEQ tablet Take 2 tablets (40 mEq total) by mouth 2 (two) times daily. 04/17/23   Laurey Morale, MD  prazosin (MINIPRESS) 2 MG capsule Take 1 capsule (2 mg total) by mouth at bedtime. 02/08/23   Zannie Cove, MD  pregabalin (LYRICA) 150 MG capsule Take 1 capsule (150 mg total) by mouth 3 (three) times daily. 05/04/23   Windell Norfolk, MD  repaglinide (PRANDIN) 2 MG tablet TAKE TWO TABLETS BY MOUTH TWICE DAILY BEFORE A meal 10/04/22   Reather Littler, MD  spironolactone (ALDACTONE) 25 MG tablet Take 0.5 tablets (12.5 mg total) by mouth daily. 04/17/23   Laurey Morale, MD  tirzepatide [facility]) 15 MG/0.5ML Pen Inject 15 mg into the skin once a week. 04/17/23   Altamese Henderson, MD  torsemide (DEMADEX) 20 MG tablet Take 6 tablets (120 mg total) by mouth in the morning and at bedtime. 04/17/23   Laurey Morale, MD  traZODone (DESYREL) 100 MG tablet Take 2 tablets (200 mg total) by mouth at bedtime. 04/17/23   Mozingo, Thereasa Solo, NP  TRESIBA FLEXTOUCH 100 UNIT/ML FlexTouch Pen INJECT 40 UNITS  into THE SKIN ONCE DAILY 01/27/23   Altamese Montmorenci, MD  venlafaxine XR (EFFEXOR-XR) 75 MG 24 hr capsule Take 3 capsules (225 mg total) by mouth daily with breakfast. 04/17/23   Mozingo, Thereasa Solo, NP      Allergies    Vancomycin; Niacin and related; Tubersol [tuberculin, ppd]; and Doxycycline    Review of Systems   Review of Systems  Unable to perform ROS: Acuity of condition  Constitutional:  Negative for fever.  Respiratory:  Negative for shortness of breath.   Cardiovascular:  Positive for chest pain.  Gastrointestinal:  Negative for abdominal pain.  Neurological:  Positive for facial asymmetry and speech difficulty.    Physical Exam Updated Vital Signs BP  111/69 (BP Location: Left Arm)   Pulse 96   Temp 98.4 F (36.9 C) (Oral)   Resp 17   Ht 6\' 2"  (1.88 m)   Wt (!) 155 kg   SpO2 92%   BMI 43.87 kg/m  Physical Exam Vitals and nursing note reviewed. Exam conducted with a chaperone present.  Constitutional:      General: He is not in acute distress.    Appearance: Normal appearance. He is well-developed. He is not diaphoretic.  HENT:     Head: Normocephalic and atraumatic.     Nose: Nose normal.  Eyes:     Conjunctiva/sclera: Conjunctivae normal.     Pupils: Pupils are equal, round, and reactive to light.  Cardiovascular:     Rate and Rhythm: Normal rate and regular rhythm.     Pulses: Normal pulses.     Heart sounds: Normal heart sounds.  Pulmonary:     Effort: Pulmonary effort is normal.     Breath sounds: Normal breath sounds. No wheezing or rales.  Abdominal:     General: Bowel sounds are normal.     Palpations: Abdomen is soft.     Tenderness: There is no abdominal tenderness. There is no guarding or rebound.  Musculoskeletal:        General: Normal range of motion.     Cervical back: Normal range of motion and neck supple.  Skin:    General: Skin is warm and dry.  Neurological:     Mental Status: He is alert.     Motor: Weakness present.   Psychiatric:        Mood and Affect: Mood normal.     ED Results / Procedures / Treatments   Labs (all labs ordered are listed, but only abnormal results are displayed) Results for orders placed or performed during the hospital encounter of 05/07/23  Ethanol  Result Value Ref Range   Alcohol, Ethyl (B) <10 <10 mg/dL  Protime-INR  Result Value Ref Range   Prothrombin Time 13.6 11.4 - 15.2 seconds   INR 1.0 0.8 - 1.2  APTT  Result Value Ref Range   aPTT 29 24 - 36 seconds  CBC  Result Value Ref Range   WBC 14.9 (H) 4.0 - 10.5 K/uL   RBC 5.16 4.22 - 5.81 MIL/uL   Hemoglobin 14.4 13.0 - 17.0 g/dL   HCT 16.1 09.6 - 04.5 %   MCV 85.9 80.0 - 100.0 fL   MCH 27.9 26.0 - 34.0 pg   MCHC 32.5 30.0 - 36.0 g/dL   RDW 40.9 81.1 - 91.4 %   Platelets 303 150 - 400 K/uL   nRBC 0.0 0.0 - 0.2 %  Differential  Result Value Ref Range   Neutrophils Relative % 68 %   Neutro Abs 10.2 (H) 1.7 - 7.7 K/uL   Lymphocytes Relative 19 %   Lymphs Abs 2.8 0.7 - 4.0 K/uL   Monocytes Relative 7 %   Monocytes Absolute 1.0 0.1 - 1.0 K/uL   Eosinophils Relative 4 %   Eosinophils Absolute 0.6 (H) 0.0 - 0.5 K/uL   Basophils Relative 1 %   Basophils Absolute 0.1 0.0 - 0.1 K/uL   Immature Granulocytes 1 %   Abs Immature Granulocytes 0.10 (H) 0.00 - 0.07 K/uL  Comprehensive metabolic panel  Result Value Ref Range   Sodium 135 135 - 145 mmol/L   Potassium 2.5 (LL) 3.5 - 5.1 mmol/L   Chloride 96 (L) 98 - 111 mmol/L   CO2  23 22 - 32 mmol/L   Glucose, Bld 127 (H) 70 - 99 mg/dL   BUN 44 (H) 8 - 23 mg/dL   Creatinine, Ser 1.61 (H) 0.61 - 1.24 mg/dL   Calcium 8.4 (L) 8.9 - 10.3 mg/dL   Total Protein 6.1 (L) 6.5 - 8.1 g/dL   Albumin 3.2 (L) 3.5 - 5.0 g/dL   AST 24 15 - 41 U/L   ALT 17 0 - 44 U/L   Alkaline Phosphatase 79 38 - 126 U/L   Total Bilirubin 0.9 0.3 - 1.2 mg/dL   GFR, Estimated 49 (L) >60 mL/min   Anion gap 16 (H) 5 - 15  Urine rapid drug screen (hosp performed)  Result Value Ref Range    Opiates NONE DETECTED NONE DETECTED   Cocaine NONE DETECTED NONE DETECTED   Benzodiazepines NONE DETECTED NONE DETECTED   Amphetamines NONE DETECTED NONE DETECTED   Tetrahydrocannabinol NONE DETECTED NONE DETECTED   Barbiturates NONE DETECTED NONE DETECTED  I-stat chem 8, ED  Result Value Ref Range   Sodium 138 135 - 145 mmol/L   Potassium 2.6 (LL) 3.5 - 5.1 mmol/L   Chloride 98 98 - 111 mmol/L   BUN 49 (H) 8 - 23 mg/dL   Creatinine, Ser 0.96 (H) 0.61 - 1.24 mg/dL   Glucose, Bld 045 (H) 70 - 99 mg/dL   Calcium, Ion 4.09 (L) 1.15 - 1.40 mmol/L   TCO2 26 22 - 32 mmol/L   Hemoglobin 16.0 13.0 - 17.0 g/dL   HCT 81.1 91.4 - 78.2 %   Comment NOTIFIED PHYSICIAN   Troponin I (High Sensitivity)  Result Value Ref Range   Troponin I (High Sensitivity) 11 <18 ng/L  Troponin I (High Sensitivity)  Result Value Ref Range   Troponin I (High Sensitivity) 9 <18 ng/L   MR BRAIN WO CONTRAST  Result Date: 05/07/2023 CLINICAL DATA:  Initial evaluation for neuro deficit, stroke suspected. EXAM: MRI HEAD WITHOUT CONTRAST TECHNIQUE: Multiplanar, multiecho pulse sequences of the brain and surrounding structures were obtained without intravenous contrast. COMPARISON:  Prior CTs from earlier the same day. FINDINGS: Brain: Cerebral volume within normal limits. Mild patchy T2/FLAIR hyperintensity involving the periventricular white matter, most characteristic of chronic microvascular ischemic disease, mild for age. No evidence for acute or subacute ischemia. Gray-white matter differentiation maintained. No areas of chronic cortical infarction. No acute or chronic intracranial blood products. No mass lesion, midline shift or mass effect. No hydrocephalus or extra-axial fluid collection. Pituitary gland and suprasellar region within normal limits. Vascular: Major intracranial vascular flow voids are maintained. Skull and upper cervical spine: Craniocervical junction within normal limits. Bone marrow signal intensity  normal. No scalp soft tissue abnormality. Sinuses/Orbits: Globes orbital soft tissues within normal limits. Scattered mucosal thickening present about the sphenoid ethmoidal sinuses. Paranasal sinuses are otherwise clear. No significant mastoid effusion. Other: None. IMPRESSION: 1. No acute intracranial abnormality. 2. Mild chronic microvascular ischemic disease for age. Electronically Signed   By: Rise Mu M.D.   On: 05/07/2023 03:02   CT ANGIO HEAD NECK W WO CM (CODE STROKE)  Result Date: 05/07/2023 CLINICAL DATA:  Initial evaluation for neuro deficit, stroke. EXAM: CT ANGIOGRAPHY HEAD AND NECK WITH AND WITHOUT CONTRAST TECHNIQUE: Multidetector CT imaging of the head and neck was performed using the standard protocol during bolus administration of intravenous contrast. Multiplanar CT image reconstructions and MIPs were obtained to evaluate the vascular anatomy. Carotid stenosis measurements (when applicable) are obtained utilizing NASCET criteria, using the distal internal carotid  diameter as the denominator. RADIATION DOSE REDUCTION: This exam was performed according to the departmental dose-optimization program which includes automated exposure control, adjustment of the mA and/or kV according to patient size and/or use of iterative reconstruction technique. CONTRAST:  75mL OMNIPAQUE IOHEXOL 350 MG/ML SOLN COMPARISON:  Prior CT from earlier the same day as well as prior exam from 05/25/2022. FINDINGS: CTA NECK FINDINGS Aortic arch: Visualized aortic arch within normal limits for caliber. Bovine branching pattern noted. No stenosis about the origin of the great vessels. Right carotid system: Right common and internal carotid arteries are patent without dissection. Mild atheromatous change about the right carotid bulb without hemodynamically significant stenosis. Left carotid system: Left common and internal carotid arteries are patent without dissection. Mild atheromatous change about the left  carotid bulb without hemodynamically significant stenosis. Vertebral arteries: Both vertebral arteries arise from subclavian arteries. No proximal subclavian artery stenosis. Vertebral arteries are patent without stenosis or dissection. Skeleton: No discrete or worrisome osseous lesions. Moderate spondylosis, most pronounced at C5-6 and C6-7. Other neck: No other acute finding. Upper chest: No other acute finding. Review of the MIP images confirms the above findings CTA HEAD FINDINGS Anterior circulation: Mild atheromatous change about the carotid siphons without stenosis. A1 segments, anterior communicating artery complex. Anterior cerebral arteries patent without stenosis. No M1 stenosis or occlusion. No proximal MCA branch occlusion or high-grade stenosis. Distal to the branches perfused and symmetric. Posterior circulation: Both V4 segments patent without stenosis. Left vertebral artery dominant. Left PICA patent at its origin. Right PICA not well seen. Basilar patent without stenosis. Superior cerebellar and posterior cerebral arteries patent bilaterally. Venous sinuses: Patent allowing for timing the contrast bolus. Anatomic variants: None significant.  No aneurysm. Review of the MIP images confirms the above findings IMPRESSION: 1. Negative CTA for large vessel occlusion or other emergent finding. 2. Mild atheromatous change about the carotid bifurcations and carotid siphons without hemodynamically significant or correctable stenosis. These results were communicated to Dr. Derry Lory at 12:58 am on 05/07/2023 by text page via the [facility] messaging system. Electronically Signed   By: Rise Mu M.D.   On: 05/07/2023 01:01   DG Chest Portable 1 View  Result Date: 05/07/2023 CLINICAL DATA:  Stroke.  Aphasia and chest pain. EXAM: PORTABLE CHEST 1 VIEW COMPARISON:  02/06/2023 FINDINGS: Cardiac enlargement. No vascular congestion, edema, or consolidation. No pleural effusions. No pneumothorax.  Mediastinal contours appear intact. Calcification of the aorta. IMPRESSION: Cardiac enlargement.  No evidence of active pulmonary disease. Electronically Signed   By: Burman Nieves M.D.   On: 05/07/2023 00:58   CT HEAD CODE STROKE WO CONTRAST  Result Date: 05/07/2023 CLINICAL DATA:  Code stroke. EXAM: CT HEAD WITHOUT CONTRAST TECHNIQUE: Contiguous axial images were obtained from the base of the skull through the vertex without intravenous contrast. RADIATION DOSE REDUCTION: This exam was performed according to the departmental dose-optimization program which includes automated exposure control, adjustment of the mA and/or kV according to patient size and/or use of iterative reconstruction technique. COMPARISON:  Prior study from 05/25/2022 FINDINGS: Brain: Cerebral volume within normal limits. No acute intracranial hemorrhage. No acute large vessel territory infarct. No mass lesion or midline shift. No hydrocephalus or extra-axial fluid collection. Vascular: No abnormal hyperdense vessel. Scattered vascular calcifications noted within the carotid siphons. Skull: Scalp soft tissues and calvarium within normal limits. Sinuses/Orbits: Globes and orbital soft tissues within normal limits. Scattered mucosal thickening present about the sphenoethmoidal sinuses. No significant mastoid effusion. Other: None. ASPECTS [facility] Stroke Program  Early CT Score) - Ganglionic level infarction (caudate, lentiform nuclei, internal capsule, insula, M1-M3 cortex): 7 - Supraganglionic infarction (M4-M6 cortex): 3 Total score (0-10 with 10 being normal): 10 IMPRESSION: 1. No acute intracranial abnormality. 2. ASPECTS is 10. These results were communicated to Dr. Derry Lory at 12:26 am on 05/07/2023 by text page via the [facility] messaging system. Electronically Signed   By: Rise Mu M.D.   On: 05/07/2023 00:27     EKG  EKG Interpretation Date/Time:  Sunday May 07 2023 00:05:29 EDT Ventricular Rate:  94 PR  Interval:  244 QRS Duration:  107 QT Interval:  386 QTC Calculation: 483 R Axis:   -66  Text Interpretation: Normal sinus rhythm Multiform ventricular premature complexes Prolonged PR interval Inferior infarct, old Confirmed by Marisah Laker ([id number]) on 05/07/2023 6:19:46 AM         Radiology MR BRAIN WO CONTRAST  Result Date: 05/07/2023 CLINICAL DATA:  Initial evaluation for neuro deficit, stroke suspected. EXAM: MRI HEAD WITHOUT CONTRAST TECHNIQUE: Multiplanar, multiecho pulse sequences of the brain and surrounding structures were obtained without intravenous contrast. COMPARISON:  Prior CTs from earlier the same day. FINDINGS: Brain: Cerebral volume within normal limits. Mild patchy T2/FLAIR hyperintensity involving the periventricular white matter, most characteristic of chronic microvascular ischemic disease, mild for age. No evidence for acute or subacute ischemia. Gray-white matter differentiation maintained. No areas of chronic cortical infarction. No acute or chronic intracranial blood products. No mass lesion, midline shift or mass effect. No hydrocephalus or extra-axial fluid collection. Pituitary gland and suprasellar region within normal limits. Vascular: Major intracranial vascular flow voids are maintained. Skull and upper cervical spine: Craniocervical junction within normal limits. Bone marrow signal intensity normal. No scalp soft tissue abnormality. Sinuses/Orbits: Globes orbital soft tissues within normal limits. Scattered mucosal thickening present about the sphenoid ethmoidal sinuses. Paranasal sinuses are otherwise clear. No significant mastoid effusion. Other: None. IMPRESSION: 1. No acute intracranial abnormality. 2. Mild chronic microvascular ischemic disease for age. Electronically Signed   By: Rise Mu M.D.   On: 05/07/2023 03:02   CT ANGIO HEAD NECK W WO CM (CODE STROKE)  Result Date: 05/07/2023 CLINICAL DATA:  Initial evaluation for neuro deficit, stroke.  EXAM: CT ANGIOGRAPHY HEAD AND NECK WITH AND WITHOUT CONTRAST TECHNIQUE: Multidetector CT imaging of the head and neck was performed using the standard protocol during bolus administration of intravenous contrast. Multiplanar CT image reconstructions and MIPs were obtained to evaluate the vascular anatomy. Carotid stenosis measurements (when applicable) are obtained utilizing NASCET criteria, using the distal internal carotid diameter as the denominator. RADIATION DOSE REDUCTION: This exam was performed according to the departmental dose-optimization program which includes automated exposure control, adjustment of the mA and/or kV according to patient size and/or use of iterative reconstruction technique. CONTRAST:  75mL OMNIPAQUE IOHEXOL 350 MG/ML SOLN COMPARISON:  Prior CT from earlier the same day as well as prior exam from 05/25/2022. FINDINGS: CTA NECK FINDINGS Aortic arch: Visualized aortic arch within normal limits for caliber. Bovine branching pattern noted. No stenosis about the origin of the great vessels. Right carotid system: Right common and internal carotid arteries are patent without dissection. Mild atheromatous change about the right carotid bulb without hemodynamically significant stenosis. Left carotid system: Left common and internal carotid arteries are patent without dissection. Mild atheromatous change about the left carotid bulb without hemodynamically significant stenosis. Vertebral arteries: Both vertebral arteries arise from subclavian arteries. No proximal subclavian artery stenosis. Vertebral arteries are patent without stenosis or dissection. Skeleton: No  discrete or worrisome osseous lesions. Moderate spondylosis, most pronounced at C5-6 and C6-7. Other neck: No other acute finding. Upper chest: No other acute finding. Review of the MIP images confirms the above findings CTA HEAD FINDINGS Anterior circulation: Mild atheromatous change about the carotid siphons without stenosis. A1  segments, anterior communicating artery complex. Anterior cerebral arteries patent without stenosis. No M1 stenosis or occlusion. No proximal MCA branch occlusion or high-grade stenosis. Distal to the branches perfused and symmetric. Posterior circulation: Both V4 segments patent without stenosis. Left vertebral artery dominant. Left PICA patent at its origin. Right PICA not well seen. Basilar patent without stenosis. Superior cerebellar and posterior cerebral arteries patent bilaterally. Venous sinuses: Patent allowing for timing the contrast bolus. Anatomic variants: None significant.  No aneurysm. Review of the MIP images confirms the above findings IMPRESSION: 1. Negative CTA for large vessel occlusion or other emergent finding. 2. Mild atheromatous change about the carotid bifurcations and carotid siphons without hemodynamically significant or correctable stenosis. These results were communicated to Dr. Derry Lory at 12:58 am on 05/07/2023 by text page via the [facility] messaging system. Electronically Signed   By: Rise Mu M.D.   On: 05/07/2023 01:01   DG Chest Portable 1 View  Result Date: 05/07/2023 CLINICAL DATA:  Stroke.  Aphasia and chest pain. EXAM: PORTABLE CHEST 1 VIEW COMPARISON:  02/06/2023 FINDINGS: Cardiac enlargement. No vascular congestion, edema, or consolidation. No pleural effusions. No pneumothorax. Mediastinal contours appear intact. Calcification of the aorta. IMPRESSION: Cardiac enlargement.  No evidence of active pulmonary disease. Electronically Signed   By: Burman Nieves M.D.   On: 05/07/2023 00:58   CT HEAD CODE STROKE WO CONTRAST  Result Date: 05/07/2023 CLINICAL DATA:  Code stroke. EXAM: CT HEAD WITHOUT CONTRAST TECHNIQUE: Contiguous axial images were obtained from the base of the skull through the vertex without intravenous contrast. RADIATION DOSE REDUCTION: This exam was performed according to the departmental dose-optimization program which includes automated  exposure control, adjustment of the mA and/or kV according to patient size and/or use of iterative reconstruction technique. COMPARISON:  Prior study from 05/25/2022 FINDINGS: Brain: Cerebral volume within normal limits. No acute intracranial hemorrhage. No acute large vessel territory infarct. No mass lesion or midline shift. No hydrocephalus or extra-axial fluid collection. Vascular: No abnormal hyperdense vessel. Scattered vascular calcifications noted within the carotid siphons. Skull: Scalp soft tissues and calvarium within normal limits. Sinuses/Orbits: Globes and orbital soft tissues within normal limits. Scattered mucosal thickening present about the sphenoethmoidal sinuses. No significant mastoid effusion. Other: None. ASPECTS [facility] Stroke Program Early CT Score) - Ganglionic level infarction (caudate, lentiform nuclei, internal capsule, insula, M1-M3 cortex): 7 - Supraganglionic infarction (M4-M6 cortex): 3 Total score (0-10 with 10 being normal): 10 IMPRESSION: 1. No acute intracranial abnormality. 2. ASPECTS is 10. These results were communicated to Dr. Derry Lory at 12:26 am on 05/07/2023 by text page via the [facility] messaging system. Electronically Signed   By: Rise Mu M.D.   On: 05/07/2023 00:27    Procedures Procedures    Medications Ordered in ED Medications  potassium chloride 10 mEq in 100 mL IVPB (10 mEq Intravenous New Bag/Given 05/07/23 0544)  iohexol (OMNIPAQUE) 350 MG/ML injection 75 mL (75 mLs Intravenous Contrast Given 05/07/23 0037)  LORazepam (ATIVAN) tablet 1 mg (1 mg Oral Given 05/07/23 0121)  magnesium sulfate IVPB 2 g 50 mL (0 g Intravenous Stopped 05/07/23 0341)    ED Course/ Medical Decision Making/ A&P  Medical Decision Making Patient with aphasia LSN 4 pm awoke at 1030 and aphasic   Amount and/or Complexity of Data Reviewed Independent Historian: EMS    Details: See above  External Data Reviewed: notes.    Details:  Previous notes reviewed  Labs: ordered.    Details: UDS was negative.  Alcohol is negative.  Normal coags. Elevated white count 14.9, normal hemoglobin 14.4, normal platelets.  Normal sodium 135, low potassium 2.5, elevated creatinine 1.50, 2 negative troponins 11/9  Radiology: ordered and independent interpretation performed.    Details: Negative CXR, no acute stroke on CT head by me   Risk Prescription drug management. Decision regarding hospitalization.    Final Clinical Impression(s) / ED Diagnoses Final diagnoses:  Chest pain, unspecified type  TIA (transient ischemic attack)  Hypokalemia  AKI (acute kidney injury) (HCC)   The patient appears reasonably stabilized for admission considering the current resources, flow, and capabilities available in the ED at this time, and I doubt any other [facility] requiring further screening and/or treatment in the ED prior to admission.  Rx / DC Orders ED Discharge Orders     None         Isaiahs Chancy, MD 05/07/23 [phone]

## 2023-05-07 NOTE — ED Notes (Signed)
CALLED TO ACTIVATE CODE STROKE PER DR  PALUMBO

## 2023-05-07 NOTE — Evaluation (Signed)
Physical Therapy Evaluation Patient Details Name: Lawrence [name] MRN: [id number] DOB: [birth year] Today's Date: 05/07/2023  History of Present Illness  Pt is 67 yo male presented on 05/07/23  with concern for stroke due to aphasia.  MRI was negative for stroke.  Pt was found to be dehydrated with AKI and severe hypokalemia.  Pt has been receiving potassium today.  Pt with hx including but not limited to  atrial fibrillation on anticoagulation, history of severe aortic stenosis status post TAVR recently, prior stroke with residual left-sided weakness, diabetes mellitus type 2 with neuropathy, history of seizure activity on antiepileptics, coronary artery disease, COPD, obstructive sleep apnea  Clinical Impression  Pt admitted with above diagnosis. At baseline, pt resides with wife and ambulates short distances in home with rollator.  He has residual L weakness from CVA and reports arthritis in R knee with severe genu varus.  Today, pt motivated to work with PT.  He required min A for sit to stand and ambulated 25' with RW.  Pt expected to progress well and be able to return home with family and HHPT.   Pt currently with functional limitations due to the deficits listed below (see PT Problem List). Pt will benefit from acute skilled PT to increase their independence and safety with mobility to allow discharge.           If plan is discharge home, recommend the following: A little help with walking and/or transfers;A little help with bathing/dressing/bathroom;Assistance with cooking/housework;Help with stairs or ramp for entrance   Can travel by private vehicle        Equipment Recommendations None recommended by PT  Recommendations for Other Services       Functional Status Assessment Patient has had a recent decline in their functional status and demonstrates the ability to make significant improvements in function in a reasonable and predictable amount of time.     Precautions /  Restrictions Precautions Precautions: Fall      Mobility  Bed Mobility Overal bed mobility: Needs Assistance Bed Mobility: Supine to Sit     Supine to sit: Supervision, HOB elevated          Transfers Overall transfer level: Needs assistance Equipment used: Rolling walker (2 wheels) Transfers: Sit to/from Stand Sit to Stand: From elevated surface, Min assist           General transfer comment: Required 2 attempts, use of momentum, increased time, and cues for hand placement; auidable crepitus in R Knee    Ambulation/Gait Ambulation/Gait assistance: Contact guard assist Gait Distance (Feet): 25 Feet Assistive device: Rolling walker (2 wheels) Gait Pattern/deviations: Step-to pattern, Decreased stride length Gait velocity: decreased     General Gait Details: Pt with severe genu varus on R that increased with weight bearing - reports baseline, no pain, does not regularly wear brace.  No LOB but did fatigue easily.  Stairs            Wheelchair Mobility     Tilt Bed    Modified Rankin (Stroke Patients Only)       Balance Overall balance assessment: Needs assistance Sitting-balance support: No upper extremity supported Sitting balance-Leahy Scale: Good     Standing balance support: Bilateral upper extremity supported, Reliant on assistive device for balance Standing balance-Leahy Scale: Poor Standing balance comment: steady with RW  Pertinent Vitals/Pain Pain Assessment Pain Assessment: No/denies pain    Home Living Family/patient expects to be discharged to:: Private residence Living Arrangements: Spouse/significant other Available Help at Discharge: Family;Available 24 hours/day;Personal care attendant Type of Home: Apartment Home Access: Level entry       Home Layout: One level Home Equipment: Rollator (4 wheels);Electric scooter;BSC/3in1;Hospital bed;Lift chair Additional Comments: Has a PCA 4  days a week for about 4 hours a day (13 hour week); wears CPAP at night, no O2 at home    Prior Function Prior Level of Function : Needs assist             Mobility Comments: Rollator for household mobility (short distances); electric scooter in community dwellings. ADLs Comments: aide assists with bathing/dressing, provides set up A for grooming tasks, light household chores,  utilizes transportation services. uses BSC does not go into bathroom     Extremity/Trunk Assessment   Upper Extremity Assessment Upper Extremity Assessment: Overall WFL for tasks assessed    Lower Extremity Assessment Lower Extremity Assessment: LLE deficits/detail;RLE deficits/detail RLE Deficits / Details: ROM: Severe genu varus in standing otherwise WFL; MMT 5/5 LLE Deficits / Details: ROM: WFL, MMT: ankle 5/5, knee ext 3/5, hip 4/5 ; reports baseline weakness from CVA and knee surgery    Cervical / Trunk Assessment Cervical / Trunk Assessment: Other exceptions Cervical / Trunk Exceptions: some limitations due to body habitus  Communication      Cognition Arousal: Alert Behavior During Therapy: WFL for tasks assessed/performed Overall Cognitive Status: Within Functional Limits for tasks assessed                                 General Comments: Pt reports feeling back to normal - did have 2 instances of word finding difficulty        General Comments General comments (skin integrity, edema, etc.): Pt was on 3 L O2 at arrival with sats 99%.  Reports RA at baseline except CPAP at night.  Tried RA and sats remained >95%.  Pt positioned in recliner.  Notified RN left pt on RA and she reports she will check back shortly    Exercises     Assessment/Plan    PT Assessment Patient needs continued PT services  PT Problem List Decreased strength;Decreased range of motion;Decreased knowledge of use of DME;Decreased activity tolerance;Decreased balance;Decreased mobility;Cardiopulmonary  status limiting activity       PT Treatment Interventions DME instruction;Therapeutic exercise;Balance training;Gait training;Functional mobility training;Therapeutic activities;Patient/family education;Wheelchair mobility training    PT Goals (Current goals can be found in the Care Plan section)  Acute Rehab PT Goals Patient Stated Goal: return home PT Goal Formulation: With patient Time For Goal Achievement: 05/21/23 Potential to Achieve Goals: Good    Frequency Min 1X/week     Co-evaluation               AM-PAC PT "6 Clicks" Mobility  Outcome Measure Help needed turning from your back to your side while in a flat bed without using bedrails?: A Little Help needed moving from lying on your back to sitting on the side of a flat bed without using bedrails?: A Little Help needed moving to and from a bed to a chair (including a wheelchair)?: A Little Help needed standing up from a chair using your arms (e.g., wheelchair or bedside chair)?: A Little Help needed to walk in hospital room?: A Little Help needed climbing 3-5  steps with a railing? : A Lot 6 Click Score: 17    End of Session Equipment Utilized During Treatment: Gait belt Activity Tolerance: Patient tolerated treatment well Patient left: with chair alarm set;in chair;with call bell/phone within reach Nurse Communication: Mobility status PT Visit Diagnosis: Other abnormalities of gait and mobility (R26.89);Muscle weakness (generalized) (M62.81)    Time: 1434-1500 PT Time Calculation (min) (ACUTE ONLY): 26 min   Charges:   PT Evaluation $PT Eval Low Complexity: 1 Low PT Treatments $Gait Training: 8-22 mins PT General Charges $$ ACUTE PT VISIT: 1 Visit         Anise Salvo, PT Acute Rehab [facility] [phone]   Rayetta Humphrey 05/07/2023, 3:11 PM

## 2023-05-07 NOTE — Code Documentation (Addendum)
Responded to Code Stroke called on pt who had just arrived to the ED at 0004. Code Stroke called at 0010 for L facial droop and aphasia, LSN-1600. CBG-139, NIH-7, CT head negative for acute changes, CTA-no LVO. TNK not given-outside window, on eliquis. Plan for stroke workup. Please do VS/Neuro checks q2h x 12, then q4h.

## 2023-05-07 NOTE — ED Triage Notes (Signed)
Pt family called ems due to having been around the patient at 4pm and he was normal. Around 10:30pm pt was found to be aphasic. Pt has a history of stoke with left side deficit but functions well. Pt also complaining of chest discomfort that he considers palpatations.

## 2023-05-07 NOTE — ED Notes (Signed)
Critical K+ 2.5. Palumbo made aware.

## 2023-05-07 NOTE — Consult Note (Addendum)
NEUROLOGY CONSULTATION NOTE   Date of service: May 07, 2023 Patient Name: Lawrence [name] MRN:  [id number] DOB:  1956/03/23 Reason for consult: "L sided weakness, concern for aphasia" Requesting Provider: Cy Blamer, MD _ _ _   _ __   _ __ _ _  __ __   _ __   __ _  History of Present Illness  Lawrence [name] is a 67 y.o. male with PMH significant for afibb on eliquis with last dose in PM, DM2 with neuropathy, CAD, prior stroke with mild residual left sided weakness(walks with a walker/cane), Htn, morbid obesity, COPD, OSA, suspected seizures on Keppra and Vimpat who presents with unsteadiness, worsening slurred speech and concern for aphasia.  He reports feeling tired. He is not sure why he is in the hospital and that his significant other called EMS. Signifcant other reports that he was last seen normal at around 1600. He was watching baseball. Around 1620, his speech was slurred and he was having some chest pain. Significant other left and then later, when he was walking, he seemed more unsteady on his feet even with his walker. She got worried and called EMS. He was brought in to the ED. Was evaluated by EDP and a code stroke was activated for concern for aphasia.  On my eval, no aphasia. He is slow, lethargic but oriented to self, month, age. Identifies objects. Speech is slurred. He denies chest pain.  LKW: 1600 mRS: 3 tNKASE: not offered, he is on eliquis, no significant change compared to his baseline deficit. Thrombectomy: not offered, no LVO. NIHSS components Score: Comment  1a Level of Conscious 0[x]  1[]  2[]  3[]      1b LOC Questions 0[x]  1[]  2[]       1c LOC Commands 0[x]  1[]  2[]       2 Best Gaze 0[x]  1[]  2[]       3 Visual 0[x]  1[]  2[]  3[]      4 Facial Palsy 0[]  1[x]  2[]  3[]      5a Motor Arm - left 0[]  1[x]  2[]  3[]  4[]  UN[]    5b Motor Arm - Right 0[x]  1[]  2[]  3[]  4[]  UN[]    6a Motor Leg - Left 0[]  1[]  2[x]  3[]  4[]  UN[]    6b Motor Leg - Right 0[x]  1[]  2[]  3[]  4[]  UN[]     7 Limb Ataxia 0[x]  1[]  2[]  3[]  UN[]     8 Sensory 0[]  1[x]  2[]  UN[]      9 Best Language 0[x]  1[]  2[]  3[]      10 Dysarthria 0[]  1[x]  2[]  UN[]      11 Extinct. and Inattention 0[]  1[x]  2[]       TOTAL: 7     ROS   Constitutional Denies weight loss, fever and chills.   HEENT Denies changes in vision and hearing.   Respiratory Denies SOB and cough.   CV Denies palpitations and CP  GI Denies abdominal pain, nausea, vomiting and diarrhea.   GU Denies dysuria and urinary frequency.   MSK Denies myalgia and joint pain.   Skin Denies rash and pruritus.   Neurological Denies headache and syncope.   Psychiatric Denies recent changes in mood. Denies anxiety and depression.    Past History   Past Medical History:  Diagnosis Date   Acquired dilation of ascending aorta and aortic root (HCC)    40mm by echo 01/2021   Adenomatous colon polyp 2007   Anemia    Anxiety    Asthma    BPH without obstruction/lower urinary tract symptoms 02/22/2017  Chronic diastolic (congestive) heart failure (HCC)    Chronic venous stasis 03/07/2019   COPD (chronic obstructive pulmonary disease) (HCC)    Coronary artery calcification seen on CAT scan    Depression    Diabetic neuropathy (HCC) 09/11/2019   History of colon polyps 08/24/2018   Hypertension    Morbid obesity (HCC)    OSA (obstructive sleep apnea)    Pain due to onychomycosis of toenails of both feet 09/11/2019   Peripheral neuropathy 02/22/2017   Primary osteoarthritis, left shoulder 03/05/2017   PTSD (post-traumatic stress disorder)    Pure hypercholesterolemia    QT prolongation 03/07/2019   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 Lynnette Caffey and Dr. Laneta Simmers   Seizures [facility])    Severe aortic stenosis    Sinus tachycardia 03/07/2019   Sleep apnea    CPAP   Type 2 diabetes mellitus with vascular disease (HCC) 09/11/2019   Past Surgical History:  Procedure Laterality Date    ENDOVENOUS ABLATION SAPHENOUS VEIN W/ LASER Right 08/20/2020   endovenous laser ablation right greater saphenous vein by Cari Caraway MD    ENDOVENOUS ABLATION SAPHENOUS VEIN W/ LASER Left 11/16/2022   endovenous laser ablation left greater saphenous vein by Cari Caraway MD   INTRAOPERATIVE TRANSTHORACIC ECHOCARDIOGRAM N/A 02/07/2023   Procedure: INTRAOPERATIVE TRANSTHORACIC ECHOCARDIOGRAM;  Surgeon: Orbie Pyo, MD;  Location: [facility] INVASIVE CV LAB;  Service: Open Heart Surgery;  Laterality: N/A;   JOINT REPLACEMENT     left knee replacement x 2   KNEE ARTHROSCOPY Bilateral    LEFT HEART CATH AND CORONARY ANGIOGRAPHY N/A 01/17/2018   Procedure: LEFT HEART CATH AND CORONARY ANGIOGRAPHY;  Surgeon: Marykay Lex, MD;  Location: [facility] INVASIVE CV LAB;  Service: Cardiovascular;  Laterality: N/A;   PRESSURE SENSOR/CARDIOMEMS N/A 08/26/2021   Procedure: PRESSURE SENSOR/CARDIOMEMS;  Surgeon: Laurey Morale, MD;  Location: [facility] INVASIVE CV LAB;  Service: Cardiovascular;  Laterality: N/A;   RIGHT HEART CATH N/A 08/26/2021   Procedure: RIGHT HEART CATH;  Surgeon: Laurey Morale, MD;  Location: [facility] INVASIVE CV LAB;  Service: Cardiovascular;  Laterality: N/A;   RIGHT HEART CATH AND CORONARY ANGIOGRAPHY N/A 01/26/2023   Procedure: RIGHT HEART CATH AND CORONARY ANGIOGRAPHY;  Surgeon: Orbie Pyo, MD;  Location: [facility] INVASIVE CV LAB;  Service: Cardiovascular;  Laterality: N/A;   TEE WITHOUT CARDIOVERSION N/A 08/26/2021   Procedure: TRANSESOPHAGEAL ECHOCARDIOGRAM (TEE);  Surgeon: Laurey Morale, MD;  Location: [facility] ENDOSCOPY;  Service: Cardiovascular;  Laterality: N/A;   TOOTH EXTRACTION N/A 02/03/2023   Procedure: DENTAL RESTORATION/EXTRACTIONS;  Surgeon: [name], DMD;  Location: [facility] OR;  Service: Oral Surgery;  Laterality: N/A;   TRANSCATHETER AORTIC VALVE REPLACEMENT, TRANSFEMORAL Right 02/07/2023   Procedure: Transcatheter Aortic Valve Replacement, Transfemoral;  Surgeon: Orbie Pyo, MD;   Location: [facility] INVASIVE CV LAB;  Service: Open Heart Surgery;  Laterality: Right;   UMBILICAL HERNIA REPAIR     Family History  Problem Relation Age of Onset   CAD Maternal Grandfather    Diabetes Other    Diabetes Mellitus II Neg Hx    Colon cancer Neg Hx    Esophageal cancer Neg Hx    Inflammatory bowel disease Neg Hx    Liver disease Neg Hx    Pancreatic cancer Neg Hx    Rectal cancer Neg Hx    Stomach cancer Neg Hx    Sleep apnea Neg Hx    Social History  Socioeconomic History   Marital status: Significant Other    Spouse name: [name]   Number of children: 1   Years of education: Not on file   Highest education level: Associate degree: academic program  Occupational History   Occupation: retired    Comment: Building control surveyor  Tobacco Use   Smoking status: Former    Current packs/day: 0.00    Average packs/day: 1 pack/day for 44.0 years (44.0 ttl pk-yrs)    Types: Cigarettes    Start date: 03/07/1972    Quit date: 03/07/2016    Years since quitting: 7.1    Passive exposure: Past   Smokeless tobacco: Never  Vaping Use   Vaping status: Never Used  Substance and Sexual Activity   Alcohol use: Yes    Comment: rare   Drug use: No   Sexual activity: Not on file  Other Topics Concern   Not on file  Social History Narrative   Lives with wife [name]   Right Handed   Drinks 1-2 cups caffeine   Social Determinants of Health   Financial Resource Strain: Medium Risk (12/06/2021)   Received from [facility], [facility]   Overall Financial Resource Strain (CARDIA)    Difficulty of Paying Living Expenses: Somewhat hard  Food Insecurity: Food Insecurity Present (01/23/2023)   Hunger Vital Sign    Worried About Running Out of Food in the Last Year: Sometimes true    Ran Out of Food in the Last Year: Sometimes true  Transportation Needs: No Transportation Needs (01/23/2023)   PRAPARE - Administrator, Civil Service (Medical): No    Lack of Transportation  (Non-Medical): No  Physical Activity: Unknown (12/06/2021)   Received from [facility], [facility]   Exercise Vital Sign    Days of Exercise per Week: Patient declined    Minutes of Exercise per Session: 10 min  Stress: No Stress Concern Present (01/21/2022)   Received from [facility], [facility] of Occupational Health - Occupational Stress Questionnaire    Feeling of Stress : Only a little  Recent Concern: Stress - Stress Concern Present (11/19/2021)   Received from [facility] of Occupational Health - Occupational Stress Questionnaire    Feeling of Stress : To some extent  Social Connections: Unknown (12/18/2022)   Received from [facility], [facility]   Social Network    Social Network: Not on file   Allergies  Allergen Reactions   Vancomycin Other (See Comments)    "Red Man Syndrome" 02/02/17: possible cause for rash under both arms   Niacin And Related Other (See Comments)    Red man syndrome   Tubersol [Tuberculin, Ppd] Other (See Comments)    Reaction unknown   Doxycycline Rash and Other (See Comments)    Medications  (Not in a hospital admission)    Vitals   Vitals:   05/07/23 0006 05/07/23 0045  BP: (!) 111/55 (!) 111/55  Pulse: 99 86  Resp: 19 18  Temp: 98.5 F (36.9 C)   TempSrc: Oral   SpO2: 90% 91%     There is no height or weight on file to calculate BMI.  Physical Exam   General: obese male, laying comfortably in bed; in no acute distress.  HENT: Normal oropharynx and mucosa. Normal external appearance of ears and nose.  Neck: Supple, no pain or tenderness  CV: No JVD. No peripheral edema.  Pulmonary: Symmetric Chest rise. Normal respiratory effort.  Abdomen: Soft to touch, non-tender.  Ext: No cyanosis, edema, or deformity  Skin: No rash. Normal palpation of skin.   Musculoskeletal: Normal digits and nails by inspection. No clubbing.   Neurologic Examination  Mental status/Cognition:  Alert, oriented to self, place, month and year, good attention.  Speech/language: slurred, bradyphrenic, fluent, comprehension intact, object naming intact, repetition intact.  Cranial nerves:   CN II Pupils equal and reactive to light, no VF deficits    CN III,IV,VI EOM intact, no gaze preference or deviation, no nystagmus    CN V normal sensation in V1, V2, and V3 segments bilaterally    CN VII Mild L facial droop   CN VIII normal hearing to speech    CN IX & X normal palatal elevation, no uvular deviation    CN XI 5/5 head turn and 5/5 shoulder shrug bilaterally    CN XII midline tongue protrusion    Motor:  Muscle bulk: normal, tone normal Mvmt Root Nerve  Muscle Right Left Comments  SA C5/6 Ax Deltoid     EF C5/6 Mc Biceps 5 4+   EE C6/7/8 Rad Triceps 5 4+   WF C6/7 Med FCR     WE C7/8 PIN ECU     F Ab C8/T1 U ADM/FDI 5 4+   HF L1/2/3 Fem Illopsoas 4+ 4   KE L2/3/4 Fem Quad     DF L4/5 D Peron Tib Ant 5 4   PF S1/2 Tibial Grc/Sol 5 4    Sensation:  Light touch Mildly decreased in left.   Pin prick    Temperature    Vibration   Proprioception    Coordination/Complex Motor:  - Finger to Nose intact BL - Heel to shin unable to do 2/2 body habitus. - Rapid alternating movement are slowed throughout - Gait: deferred for patient safety.  Labs   CBC:  Recent Labs  Lab 05/07/23 0030 05/07/23 0051  WBC  --  14.9*  NEUTROABS  --  10.2*  HGB 16.0 14.4  HCT 47.0 44.3  MCV  --  85.9  PLT  --  303    Basic Metabolic Panel:  Lab Results  Component Value Date   NA 138 05/07/2023   K 2.6 (LL) 05/07/2023   CO2 26 02/15/2023   GLUCOSE 139 (H) 05/07/2023   BUN 49 (H) 05/07/2023   CREATININE 1.70 (H) 05/07/2023   CALCIUM 8.9 02/15/2023   GFRNONAA >60 02/08/2023   GFRAA 77 05/20/2020   Lipid Panel:  Lab Results  Component Value Date   LDLCALC 60 12/05/2022   HgbA1c:  Lab Results  Component Value Date   HGBA1C 6.4 (H) 02/07/2023   Urine Drug Screen:      Component Value Date/Time   LABOPIA NONE DETECTED 02/25/2022 0950   COCAINSCRNUR NONE DETECTED 02/25/2022 0950   LABBENZ POSITIVE (A) 02/25/2022 0950   AMPHETMU NONE DETECTED 02/25/2022 0950   THCU NONE DETECTED 02/25/2022 0950   LABBARB NONE DETECTED 02/25/2022 0950    Alcohol Level     Component Value Date/Time   ETH <10 05/25/2022 2101   INR  Lab Results  Component Value Date   INR 1.0 05/07/2023   APTT  Lab Results  Component Value Date   APTT 29 05/07/2023     CT Head without contrast(Personally reviewed): CTH was negative for a large hypodensity concerning for a large territory infarct or hyperdensity concerning for an ICH  CT angio Head and Neck with contrast(Personally reviewed): No LVO  MRI Brain: pending  Impression   Lawrence [name] is a 67 y.o. male with PMH significant for afibb on eliquis with last dose in PM, DM2 with neuropathy, CAD, prior stroke with mild residual left sided weakness(walks with a walker/cane), Htn, morbid obesity, COPD, OSA, suspected seizures on Keppra and Vimpat who presents with unsteadiness, worsening slurred speech and concern for aphasia.  No aphasia on my evaluation, speech is significantly slurred along with lethargy and known left sided weakness that he does not feel is worse.  Could be a new acute stroke vs recrudescence of prior stroke symptoms.  Recommendations  - MRI Brain w.o contrast. If negative, no further workup.   Update. 5:35 AM Reviewed MRI Brain which is negative for acute stroke. Likely AKI and hypokalemia could very well explain worsening of his prior stroke symptoms. No further inpatient neurologic workup at this time. We will signoff. ______________________________________________________________________   Thank you for the opportunity to take part in the care of this patient. If you have any further questions, please contact the neurology consultation attending.  Signed,  Erick Blinks [facility] _ _ _   _ __   _ __ _ _  __ __   _ __   __ _

## 2023-05-07 NOTE — ED Notes (Signed)
Pt to MRI

## 2023-05-07 NOTE — ED Notes (Signed)
ED TO INPATIENT HANDOFF REPORT  ED Nurse Name and Phone #: Osvaldo Shipper RN [phone]  S Name/Age/Gender Lawrence [name] 67 y.o. male Room/Bed: 004C/004C  Code Status   Code Status: Prior  Home/SNF/Other Home Patient oriented to: self, place, time, and situation Is this baseline? Yes   Triage Complete: Triage complete  Chief Complaint aphasia;slurred speech  Triage Note Pt family called ems due to having been around the patient at 4pm and he was normal. Around 10:30pm pt was found to be aphasic. Pt has a history of stoke with left side deficit but functions well. Pt also complaining of chest discomfort that he considers palpatations.     Allergies Allergies  Allergen Reactions   Vancomycin Other (See Comments)    "Red Man Syndrome" 02/02/17: possible cause for rash under both arms   Niacin And Related Other (See Comments)    Red man syndrome   Tubersol [Tuberculin, Ppd] Other (See Comments)    Reaction unknown   Doxycycline Rash and Other (See Comments)    Level of Care/Admitting Diagnosis ED Disposition     ED Disposition  Admit   Condition  --   Comment  The patient appears reasonably stabilized for admission considering the current resources, flow, and capabilities available in the ED at this time, and I doubt any other [facility] requiring further screening and/or treatment in the ED prior to admission is  present.          B Medical/Surgery History Past Medical History:  Diagnosis Date   Acquired dilation of ascending aorta and aortic root (HCC)    40mm by echo 01/2021   Adenomatous colon polyp 2007   Anemia    Anxiety    Asthma    BPH without obstruction/lower urinary tract symptoms 02/22/2017   Chronic diastolic (congestive) heart failure (HCC)    Chronic venous stasis 03/07/2019   COPD (chronic obstructive pulmonary disease) (HCC)    Coronary artery calcification seen on CAT scan    Depression    Diabetic neuropathy (HCC) 09/11/2019   History of  colon polyps 08/24/2018   Hypertension    Morbid obesity (HCC)    OSA (obstructive sleep apnea)    Pain due to onychomycosis of toenails of both feet 09/11/2019   Peripheral neuropathy 02/22/2017   Primary osteoarthritis, left shoulder 03/05/2017   PTSD (post-traumatic stress disorder)    Pure hypercholesterolemia    QT prolongation 03/07/2019   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 Lynnette Caffey and Dr. Laneta Simmers   Seizures [facility])    Severe aortic stenosis    Sinus tachycardia 03/07/2019   Sleep apnea    CPAP   Type 2 diabetes mellitus with vascular disease (HCC) 09/11/2019   Past Surgical History:  Procedure Laterality Date   ENDOVENOUS ABLATION SAPHENOUS VEIN W/ LASER Right 08/20/2020   endovenous laser ablation right greater saphenous vein by Cari Caraway MD    ENDOVENOUS ABLATION SAPHENOUS VEIN W/ LASER Left 11/16/2022   endovenous laser ablation left greater saphenous vein by Cari Caraway MD   INTRAOPERATIVE TRANSTHORACIC ECHOCARDIOGRAM N/A 02/07/2023   Procedure: INTRAOPERATIVE TRANSTHORACIC ECHOCARDIOGRAM;  Surgeon: Orbie Pyo, MD;  Location: [facility] INVASIVE CV LAB;  Service: Open Heart Surgery;  Laterality: N/A;   JOINT REPLACEMENT     left knee replacement x 2   KNEE ARTHROSCOPY Bilateral    LEFT HEART CATH AND CORONARY ANGIOGRAPHY N/A 01/17/2018   Procedure: LEFT HEART CATH AND CORONARY ANGIOGRAPHY;  Surgeon: Marykay Lex, MD;  Location: [facility] INVASIVE CV LAB;  Service: Cardiovascular;  Laterality: N/A;   PRESSURE SENSOR/CARDIOMEMS N/A 08/26/2021   Procedure: PRESSURE SENSOR/CARDIOMEMS;  Surgeon: Laurey Morale, MD;  Location: [facility] INVASIVE CV LAB;  Service: Cardiovascular;  Laterality: N/A;   RIGHT HEART CATH N/A 08/26/2021   Procedure: RIGHT HEART CATH;  Surgeon: Laurey Morale, MD;  Location: [facility] INVASIVE CV LAB;  Service: Cardiovascular;  Laterality: N/A;   RIGHT HEART CATH AND CORONARY ANGIOGRAPHY N/A  01/26/2023   Procedure: RIGHT HEART CATH AND CORONARY ANGIOGRAPHY;  Surgeon: Orbie Pyo, MD;  Location: [facility] INVASIVE CV LAB;  Service: Cardiovascular;  Laterality: N/A;   TEE WITHOUT CARDIOVERSION N/A 08/26/2021   Procedure: TRANSESOPHAGEAL ECHOCARDIOGRAM (TEE);  Surgeon: Laurey Morale, MD;  Location: [facility] ENDOSCOPY;  Service: Cardiovascular;  Laterality: N/A;   TOOTH EXTRACTION N/A 02/03/2023   Procedure: DENTAL RESTORATION/EXTRACTIONS;  Surgeon: [name], DMD;  Location: [facility] OR;  Service: Oral Surgery;  Laterality: N/A;   TRANSCATHETER AORTIC VALVE REPLACEMENT, TRANSFEMORAL Right 02/07/2023   Procedure: Transcatheter Aortic Valve Replacement, Transfemoral;  Surgeon: Orbie Pyo, MD;  Location: [facility] INVASIVE CV LAB;  Service: Open Heart Surgery;  Laterality: Right;   UMBILICAL HERNIA REPAIR       A IV Location/Drains/Wounds Patient Lines/Drains/Airways Status     Active Line/Drains/Airways     Name Placement date Placement time Site Days   Peripheral IV 05/07/23 18 G Anterior;Distal;Right Forearm 05/07/23  0230  Forearm  less than 1   Wound / Incision (Open or Dehisced) 10/26/22 Venous stasis ulcer Leg Left 10/26/22  2314  Leg  193            Intake/Output Last 24 hours No intake or output data in the 24 hours ending 05/07/23 0746  Labs/Imaging Results for orders placed or performed during the hospital encounter of 05/07/23 (from the past 48 hour(s))  I-stat chem 8, ED     Status: Abnormal   Collection Time: 05/07/23 12:30 AM  Result Value Ref Range   Sodium 138 135 - 145 mmol/L   Potassium 2.6 (LL) 3.5 - 5.1 mmol/L   Chloride 98 98 - 111 mmol/L   BUN 49 (H) 8 - 23 mg/dL   Creatinine, Ser 1.47 (H) 0.61 - 1.24 mg/dL   Glucose, Bld 829 (H) 70 - 99 mg/dL    Comment: Glucose reference range applies only to samples taken after fasting for at least 8 hours.   Calcium, Ion 1.07 (L) 1.15 - 1.40 mmol/L   TCO2 26 22 - 32 mmol/L   Hemoglobin 16.0 13.0 - 17.0 g/dL   HCT  56.2 13.0 - 86.5 %   Comment NOTIFIED PHYSICIAN   Ethanol     Status: None   Collection Time: 05/07/23 12:51 AM  Result Value Ref Range   Alcohol, Ethyl (B) <10 <10 mg/dL    Comment: (NOTE) Lowest detectable limit for serum alcohol is 10 mg/dL.  For medical purposes only. Performed at [facility], [address] [place], [place] [address]   Protime-INR     Status: None   Collection Time: 05/07/23 12:51 AM  Result Value Ref Range   Prothrombin Time 13.6 11.4 - 15.2 seconds   INR 1.0 0.8 - 1.2    Comment: (NOTE) INR goal varies based on device and disease states. Performed at [facility], [address] [place], [place] [address]   APTT     Status: None   Collection  Time: 05/07/23 12:51 AM  Result Value Ref Range   aPTT 29 24 - 36 seconds    Comment: Performed at [facility], [address] [place], [place] [address]  CBC     Status: Abnormal   Collection Time: 05/07/23 12:51 AM  Result Value Ref Range   WBC 14.9 (H) 4.0 - 10.5 K/uL   RBC 5.16 4.22 - 5.81 MIL/uL   Hemoglobin 14.4 13.0 - 17.0 g/dL   HCT 60.4 54.0 - 98.1 %   MCV 85.9 80.0 - 100.0 fL   MCH 27.9 26.0 - 34.0 pg   MCHC 32.5 30.0 - 36.0 g/dL   RDW 19.1 47.8 - 29.5 %   Platelets 303 150 - 400 K/uL   nRBC 0.0 0.0 - 0.2 %    Comment: Performed at [facility], [address], [place], [place] [address]  Differential     Status: Abnormal   Collection Time: 05/07/23 12:51 AM  Result Value Ref Range   Neutrophils Relative % 68 %   Neutro Abs 10.2 (H) 1.7 - 7.7 K/uL   Lymphocytes Relative 19 %   Lymphs Abs 2.8 0.7 - 4.0 K/uL   Monocytes Relative 7 %   Monocytes Absolute 1.0 0.1 - 1.0 K/uL   Eosinophils Relative 4 %   Eosinophils Absolute 0.6 (H) 0.0 - 0.5 K/uL   Basophils Relative 1 %   Basophils Absolute 0.1 0.0 - 0.1 K/uL   Immature Granulocytes 1 %   Abs Immature Granulocytes 0.10 (H) 0.00 - 0.07 K/uL    Comment: Performed at [facility], [address]  [place], [place] [address]  Comprehensive metabolic panel     Status: Abnormal   Collection Time: 05/07/23 12:51 AM  Result Value Ref Range   Sodium 135 135 - 145 mmol/L   Potassium 2.5 (LL) 3.5 - 5.1 mmol/L    Comment: HEMOLYSIS AT THIS LEVEL MAY AFFECT RESULT ELECTROLYTES REPEATED TO VERIFY CRITICAL RESULT CALLED TO, READ BACK BY AND VERIFIED WITH ALudwig Clarks, RN AT 01:47 09.29.24 JLASIGAN    Chloride 96 (L) 98 - 111 mmol/L   CO2 23 22 - 32 mmol/L   Glucose, Bld 127 (H) 70 - 99 mg/dL    Comment: Glucose reference range applies only to samples taken after fasting for at least 8 hours.   BUN 44 (H) 8 - 23 mg/dL   Creatinine, Ser 4.69 (H) 0.61 - 1.24 mg/dL   Calcium 8.4 (L) 8.9 - 10.3 mg/dL   Total Protein 6.1 (L) 6.5 - 8.1 g/dL   Albumin 3.2 (L) 3.5 - 5.0 g/dL   AST 24 15 - 41 U/L    Comment: HEMOLYSIS AT THIS LEVEL MAY AFFECT RESULT   ALT 17 0 - 44 U/L    Comment: HEMOLYSIS AT THIS LEVEL MAY AFFECT RESULT   Alkaline Phosphatase 79 38 - 126 U/L   Total Bilirubin 0.9 0.3 - 1.2 mg/dL    Comment: HEMOLYSIS AT THIS LEVEL MAY AFFECT RESULT   GFR, Estimated 49 (L) >60 mL/min    Comment: (NOTE) Calculated using the CKD-EPI Creatinine Equation (2021)    Anion gap 16 (H) 5 - 15    Comment: ELECTROLYTES REPEATED TO VERIFY Performed at [facility], [address], [place], [place] [address]   Troponin I (High Sensitivity)     Status: None   Collection Time: 05/07/23 12:51 AM  Result Value Ref Range   Troponin I (High Sensitivity) 11 <18  ng/L    Comment: (NOTE) Elevated high sensitivity troponin I (hsTnI) values and significant  changes across serial measurements may suggest ACS but many other  chronic and acute conditions are known to elevate hsTnI results.  Refer to the "Links" section for chest pain algorithms and additional  guidance. Performed at [facility], [address], [place], [place] [address]   Troponin I (High Sensitivity)     Status: None   Collection  Time: 05/07/23  3:25 AM  Result Value Ref Range   Troponin I (High Sensitivity) 9 <18 ng/L    Comment: (NOTE) Elevated high sensitivity troponin I (hsTnI) values and significant  changes across serial measurements may suggest ACS but many other  chronic and acute conditions are known to elevate hsTnI results.  Refer to the "Links" section for chest pain algorithms and additional  guidance. Performed at [facility], [address] [place], [place] [address]   Urine rapid drug screen (hosp performed)     Status: None   Collection Time: 05/07/23  5:11 AM  Result Value Ref Range   Opiates NONE DETECTED NONE DETECTED   Cocaine NONE DETECTED NONE DETECTED   Benzodiazepines NONE DETECTED NONE DETECTED   Amphetamines NONE DETECTED NONE DETECTED   Tetrahydrocannabinol NONE DETECTED NONE DETECTED   Barbiturates NONE DETECTED NONE DETECTED    Comment: (NOTE) DRUG SCREEN FOR MEDICAL PURPOSES ONLY.  IF CONFIRMATION IS NEEDED FOR ANY PURPOSE, NOTIFY LAB WITHIN 5 DAYS.  LOWEST DETECTABLE LIMITS FOR URINE DRUG SCREEN Drug Class                     Cutoff (ng/mL) Amphetamine and metabolites    1000 Barbiturate and metabolites    200 Benzodiazepine                 200 Opiates and metabolites        300 Cocaine and metabolites        300 THC                            50 Performed at [facility], [address] [place], [place] [address]    MR BRAIN WO CONTRAST  Result Date: 05/07/2023 CLINICAL DATA:  Initial evaluation for neuro deficit, stroke suspected. EXAM: MRI HEAD WITHOUT CONTRAST TECHNIQUE: Multiplanar, multiecho pulse sequences of the brain and surrounding structures were obtained without intravenous contrast. COMPARISON:  Prior CTs from earlier the same day. FINDINGS: Brain: Cerebral volume within normal limits. Mild patchy T2/FLAIR hyperintensity involving the periventricular white matter, most characteristic of chronic microvascular ischemic disease, mild for age.  No evidence for acute or subacute ischemia. Gray-white matter differentiation maintained. No areas of chronic cortical infarction. No acute or chronic intracranial blood products. No mass lesion, midline shift or mass effect. No hydrocephalus or extra-axial fluid collection. Pituitary gland and suprasellar region within normal limits. Vascular: Major intracranial vascular flow voids are maintained. Skull and upper cervical spine: Craniocervical junction within normal limits. Bone marrow signal intensity normal. No scalp soft tissue abnormality. Sinuses/Orbits: Globes orbital soft tissues within normal limits. Scattered mucosal thickening present about the sphenoid ethmoidal sinuses. Paranasal sinuses are otherwise clear. No significant mastoid effusion. Other: None. IMPRESSION: 1. No acute intracranial abnormality. 2. Mild chronic microvascular ischemic disease for age. Electronically Signed   By: Rise Mu M.D.   On: 05/07/2023 03:02   CT ANGIO HEAD NECK W WO CM (  CODE STROKE)  Result Date: 05/07/2023 CLINICAL DATA:  Initial evaluation for neuro deficit, stroke. EXAM: CT ANGIOGRAPHY HEAD AND NECK WITH AND WITHOUT CONTRAST TECHNIQUE: Multidetector CT imaging of the head and neck was performed using the standard protocol during bolus administration of intravenous contrast. Multiplanar CT image reconstructions and MIPs were obtained to evaluate the vascular anatomy. Carotid stenosis measurements (when applicable) are obtained utilizing NASCET criteria, using the distal internal carotid diameter as the denominator. RADIATION DOSE REDUCTION: This exam was performed according to the departmental dose-optimization program which includes automated exposure control, adjustment of the mA and/or kV according to patient size and/or use of iterative reconstruction technique. CONTRAST:  75mL OMNIPAQUE IOHEXOL 350 MG/ML SOLN COMPARISON:  Prior CT from earlier the same day as well as prior exam from 05/25/2022.  FINDINGS: CTA NECK FINDINGS Aortic arch: Visualized aortic arch within normal limits for caliber. Bovine branching pattern noted. No stenosis about the origin of the great vessels. Right carotid system: Right common and internal carotid arteries are patent without dissection. Mild atheromatous change about the right carotid bulb without hemodynamically significant stenosis. Left carotid system: Left common and internal carotid arteries are patent without dissection. Mild atheromatous change about the left carotid bulb without hemodynamically significant stenosis. Vertebral arteries: Both vertebral arteries arise from subclavian arteries. No proximal subclavian artery stenosis. Vertebral arteries are patent without stenosis or dissection. Skeleton: No discrete or worrisome osseous lesions. Moderate spondylosis, most pronounced at C5-6 and C6-7. Other neck: No other acute finding. Upper chest: No other acute finding. Review of the MIP images confirms the above findings CTA HEAD FINDINGS Anterior circulation: Mild atheromatous change about the carotid siphons without stenosis. A1 segments, anterior communicating artery complex. Anterior cerebral arteries patent without stenosis. No M1 stenosis or occlusion. No proximal MCA branch occlusion or high-grade stenosis. Distal to the branches perfused and symmetric. Posterior circulation: Both V4 segments patent without stenosis. Left vertebral artery dominant. Left PICA patent at its origin. Right PICA not well seen. Basilar patent without stenosis. Superior cerebellar and posterior cerebral arteries patent bilaterally. Venous sinuses: Patent allowing for timing the contrast bolus. Anatomic variants: None significant.  No aneurysm. Review of the MIP images confirms the above findings IMPRESSION: 1. Negative CTA for large vessel occlusion or other emergent finding. 2. Mild atheromatous change about the carotid bifurcations and carotid siphons without hemodynamically  significant or correctable stenosis. These results were communicated to Dr. Derry Lory at 12:58 am on 05/07/2023 by text page via the [facility] messaging system. Electronically Signed   By: Rise Mu M.D.   On: 05/07/2023 01:01   DG Chest Portable 1 View  Result Date: 05/07/2023 CLINICAL DATA:  Stroke.  Aphasia and chest pain. EXAM: PORTABLE CHEST 1 VIEW COMPARISON:  02/06/2023 FINDINGS: Cardiac enlargement. No vascular congestion, edema, or consolidation. No pleural effusions. No pneumothorax. Mediastinal contours appear intact. Calcification of the aorta. IMPRESSION: Cardiac enlargement.  No evidence of active pulmonary disease. Electronically Signed   By: Burman Nieves M.D.   On: 05/07/2023 00:58   CT HEAD CODE STROKE WO CONTRAST  Result Date: 05/07/2023 CLINICAL DATA:  Code stroke. EXAM: CT HEAD WITHOUT CONTRAST TECHNIQUE: Contiguous axial images were obtained from the base of the skull through the vertex without intravenous contrast. RADIATION DOSE REDUCTION: This exam was performed according to the departmental dose-optimization program which includes automated exposure control, adjustment of the mA and/or kV according to patient size and/or use of iterative reconstruction technique. COMPARISON:  Prior study from 05/25/2022 FINDINGS: Brain: Cerebral volume  within normal limits. No acute intracranial hemorrhage. No acute large vessel territory infarct. No mass lesion or midline shift. No hydrocephalus or extra-axial fluid collection. Vascular: No abnormal hyperdense vessel. Scattered vascular calcifications noted within the carotid siphons. Skull: Scalp soft tissues and calvarium within normal limits. Sinuses/Orbits: Globes and orbital soft tissues within normal limits. Scattered mucosal thickening present about the sphenoethmoidal sinuses. No significant mastoid effusion. Other: None. ASPECTS [facility] Stroke Program Early CT Score) - Ganglionic level infarction (caudate, lentiform nuclei,  internal capsule, insula, M1-M3 cortex): 7 - Supraganglionic infarction (M4-M6 cortex): 3 Total score (0-10 with 10 being normal): 10 IMPRESSION: 1. No acute intracranial abnormality. 2. ASPECTS is 10. These results were communicated to Dr. Derry Lory at 12:26 am on 05/07/2023 by text page via the [facility] messaging system. Electronically Signed   By: Rise Mu M.D.   On: 05/07/2023 00:27    Pending Labs Unresulted Labs (From admission, onward)    None       Vitals/Pain Today's Vitals   05/07/23 0330 05/07/23 0337 05/07/23 0645 05/07/23 0740  BP:  111/69  105/73  Pulse:  96 87 89  Resp:  17 14 16   Temp:  98.4 F (36.9 C)    TempSrc:  Oral    SpO2:  92% 90% 93%  Weight: (!) 155 kg     Height: 6\' 2"  (1.88 m)     PainSc:  0-No pain      Isolation Precautions No active isolations  Medications Medications  iohexol (OMNIPAQUE) 350 MG/ML injection 75 mL (75 mLs Intravenous Contrast Given 05/07/23 0037)  LORazepam (ATIVAN) tablet 1 mg (1 mg Oral Given 05/07/23 0121)  magnesium sulfate IVPB 2 g 50 mL (0 g Intravenous Stopped 05/07/23 0341)  potassium chloride 10 mEq in 100 mL IVPB (10 mEq Intravenous New Bag/Given 05/07/23 0646)    Mobility walks with device     Focused Assessments Cardiac Assessment Handoff:    Lab Results  Component Value Date   TROPONINI <0.03 08/18/2018   Lab Results  Component Value Date   DDIMER 0.38 06/08/2021   Does the Patient currently have chest pain? No   , Neuro Assessment Handoff:  Swallow screen pass? Yes    NIH Stroke Scale  Dizziness Present: No Headache Present: No Interval: Initial Level of Consciousness (1a.)   : Alert, keenly responsive LOC Questions (1b. )   : Answers both questions correctly LOC Commands (1c. )   : Performs both tasks correctly Best Gaze (2. )  : Normal Visual (3. )  : No visual loss Facial Palsy (4. )    : Minor paralysis Motor Arm, Left (5a. )   : Drift Motor Arm, Right (5b. ) : No drift Motor  Leg, Left (6a. )  : Drift Motor Leg, Right (6b. ) : No drift Limb Ataxia (7. ): Absent Sensory (8. )  : Mild-to-moderate sensory loss, patient feels pinprick is less sharp or is dull on the affected side, or there is a loss of superficial pain with pinprick, but patient is aware of being touched Best Language (9. )  : Mild-to-moderate aphasia Dysarthria (10. ): Mild-to-moderate dysarthria, patient slurs at least some words and, at worst, can be understood with some difficulty Extinction/Inattention (11.)   : No Abnormality Complete NIHSS TOTAL: 6 Last date known well: 05/06/23 Last time known well: 1600 Neuro Assessment: Exceptions to WDL Neuro Checks:   Initial (05/07/23 0017)  Has TPA been given? No If patient is a Neuro Trauma and  patient is going to OR before floor call report to 4N Charge nurse: [phone] or [phone]   R Recommendations: See Admitting Provider Note  Report given to:   Additional Notes:

## 2023-05-07 NOTE — H&P (Signed)
[facility] History and Physical  Lawrence [name] YQM:[id number] DOB: February 22, 1956 DOA: 05/07/2023   PCP: [name], PA-C  Specialists: Followed by outpatient neurology  Chief Complaint: aphasia, chest pain  HPI: Lawrence [name] is a 67 y.o. male with significant past medical history including atrial fibrillation on anticoagulation, history of severe aortic stenosis status post TAVR recently, prior stroke with residual left-sided weakness, diabetes mellitus type 2 with neuropathy, history of seizure activity on antiepileptics, coronary artery disease, COPD, obstructive sleep apnea who was brought into the hospital by EMS after family called EMS due to concern for stroke.  Patient was noted to be aphasic.  Patient was brought into the ED.  A code stroke was called.  Patient was evaluated by neurology.  Underwent CT head, CT angiogram head and neck followed by MRI which was negative for stroke.  Blood work showed that patient was dehydrated with acute kidney injury and had severe hypokalemia.  His symptoms were thought to be secondary to electrolyte abnormalities and dehydration.  Patient also mention chest pain at initial assessment.  On my evaluation patient is quite somnolent.  He is arousable.  Denies any chest pain currently.  He is a poor historian.  He has slurred speech which is chronic for him.  No family at bedside.  In the emergency department he was noted to have stable vital signs.  EKG without any ischemic changes.  PVCs were noted.  Troponin levels normal.  Potassium 2.5, creatinine 1.54, WBC 14.9.  Patient was hospitalized for further management.  Home Medications: Prior to Admission medications   Medication Sig Start Date End Date Taking? Authorizing Provider  acetaminophen (TYLENOL) 650 MG CR tablet Take 1,300 mg by mouth every 8 (eight) hours as needed for pain.   Yes [provider]  apixaban (ELIQUIS) 5 MG TABS tablet Take 1 tablet (5 mg total) by mouth 2  (two) times daily. 04/17/23  Yes Laurey Morale, MD  Fluticasone-Umeclidin-Vilant (TRELEGY ELLIPTA) 200-62.5-25 MCG/ACT AEPB INHALE 1 PUFF BY MOUTH INTO LUNGS ONCE DAILY 08/18/22  Yes Hunsucker, Lesia Sago, MD  insulin aspart (NOVOLOG FLEXPEN) 100 UNIT/ML FlexPen INJECT 12 UNITS BEFORE MEALS, keep sugar TWO HOURS AFTER meals UNDER 180 AT least 04/17/23  Yes Motwani, Komal, MD  TRESIBA FLEXTOUCH 100 UNIT/ML FlexTouch Pen INJECT 40 UNITS into THE SKIN ONCE DAILY 01/27/23  Yes Motwani, Komal, MD  albuterol (VENTOLIN HFA) 108 (90 Base) MCG/ACT inhaler Inhale 2 puffs into the lungs every 6 (six) hours as needed for wheezing or shortness of breath. 04/17/23   Hunsucker, Lesia Sago, MD  Alcohol Swabs (ALCOHOL PREP) 70 % PADS 6 Pads by Does not apply route daily. 04/17/23   Altamese Tetlin, MD  allopurinol (ZYLOPRIM) 100 MG tablet Take 200 mg by mouth 2 (two) times daily.    [provider]  amoxicillin (AMOXIL) 500 MG tablet Take 4 tablets (2,000 mg total) by mouth as directed. 1 hour prior to dental work including cleanings 02/15/23   Janetta Hora, PA-C  ARIPiprazole (ABILIFY) 5 MG tablet TAKE ONE TABLET BY MOUTH EVERYDAY AT BEDTIME 04/17/23   Mozingo, Thereasa Solo, NP  atorvastatin (LIPITOR) 40 MG tablet Take 1 tablet (40 mg total) by mouth daily. 04/17/23   Laurey Morale, MD  busPIRone (BUSPAR) 10 MG tablet Take 1 tablet (10 mg total) by mouth 3 (three) times daily. 04/17/23   Mozingo, Thereasa Solo, NP  cephALEXin (KEFLEX) 500 MG capsule Take 1 capsule (500 mg total) by mouth 4 (four)  times daily. 02/08/23   Zannie Cove, MD  cetirizine (ZYRTEC) 10 MG tablet Take 10 mg by mouth at bedtime.     [provider]  Cholecalciferol (VITAMIN D3 PO) Take 2,000 Units by mouth daily.    [provider]  cyanocobalamin (VITAMIN B12) 1000 MCG tablet Take 1 tablet (1,000 mcg total) by mouth daily. 05/04/23   Windell Norfolk, MD  dapagliflozin propanediol (FARXIGA) 10 MG TABS tablet TAKE ONE  TABLET BY MOUTH BEFORE BREAKFAST 04/17/23   Laurey Morale, MD  diclofenac Sodium (VOLTAREN) 1 % GEL Apply 4 g topically 4 (four) times daily. To right knee. 10/31/22   Arrien, York Ram, MD  fenofibrate (TRICOR) 145 MG tablet Take 1 tablet (145 mg total) by mouth daily. 04/17/23   Laurey Morale, MD  ferrous sulfate 324 (65 Fe) MG TBEC Take 324 mg by mouth daily with breakfast.     [provider]  fluticasone (FLONASE) 50 MCG/ACT nasal spray Place 2 sprays into both nostrils daily.     [provider]  icosapent Ethyl (VASCEPA) 1 g capsule Take 2 capsules (2 g total) by mouth 2 (two) times daily. 04/17/23   Laurey Morale, MD  Insulin Pen Needle (B-D ULTRAFINE III SHORT PEN) 31G X 8 MM MISC Inject 1 each into the skin 3 (three) times daily before meals. Use as instructed 4X daily. 04/17/23   Altamese Oakwood, MD  Lacosamide 150 MG TABS Take 1 tablet (150 mg total) by mouth 2 (two) times daily. 05/04/23   Windell Norfolk, MD  levETIRAcetam (KEPPRA) 500 MG tablet Take 1 tablet (500 mg total) by mouth 2 (two) times daily. 05/04/23   Windell Norfolk, MD  metFORMIN (GLUCOPHAGE-XR) 500 MG 24 hr tablet Take 4 tablets (2,000 mg total) by mouth daily. 04/17/23   Altamese Jean Lafitte, MD  metoprolol succinate (TOPROL-XL) 50 MG 24 hr tablet Take 1 tablet (50 mg total) by mouth daily. 04/17/23   Laurey Morale, MD  mirtazapine (REMERON) 15 MG tablet Take 1 tablet (15 mg total) by mouth at bedtime. 04/17/23   Mozingo, Thereasa Solo, NP  omeprazole (PRILOSEC) 20 MG capsule Take 20 mg by mouth every morning. 05/29/20   [provider]  potassium chloride SA (KLOR-CON M) 20 MEQ tablet Take 2 tablets (40 mEq total) by mouth 2 (two) times daily. 04/17/23   Laurey Morale, MD  prazosin (MINIPRESS) 2 MG capsule Take 1 capsule (2 mg total) by mouth at bedtime. 02/08/23   Zannie Cove, MD  pregabalin (LYRICA) 150 MG capsule Take 1 capsule (150 mg total) by mouth 3 (three) times daily. 05/04/23   Windell Norfolk, MD  repaglinide (PRANDIN) 2 MG tablet TAKE TWO TABLETS BY MOUTH TWICE DAILY BEFORE A meal 10/04/22   Reather Littler, MD  spironolactone (ALDACTONE) 25 MG tablet Take 0.5 tablets (12.5 mg total) by mouth daily. 04/17/23   Laurey Morale, MD  tirzepatide [facility]) 15 MG/0.5ML Pen Inject 15 mg into the skin once a week. 04/17/23   Altamese Woodbury, MD  torsemide (DEMADEX) 20 MG tablet Take 6 tablets (120 mg total) by mouth in the morning and at bedtime. 04/17/23   Laurey Morale, MD  traZODone (DESYREL) 100 MG tablet Take 2 tablets (200 mg total) by mouth at bedtime. 04/17/23   Mozingo, Thereasa Solo, NP  venlafaxine XR (EFFEXOR-XR) 75 MG 24 hr capsule Take 3 capsules (225 mg total) by mouth daily with breakfast. 04/17/23   Mozingo, Thereasa Solo, NP  Allergies:  Allergies  Allergen Reactions   Vancomycin Other (See Comments)    "Red Man Syndrome" 02/02/17: possible cause for rash under both arms   Niacin And Related Other (See Comments)    Red man syndrome   Tubersol [Tuberculin, Ppd] Other (See Comments)    Reaction unknown   Doxycycline Rash and Other (See Comments)    Past Medical History: Past Medical History:  Diagnosis Date   Acquired dilation of ascending aorta and aortic root (HCC)    40mm by echo 01/2021   Adenomatous colon polyp 2007   Anemia    Anxiety    Asthma    BPH without obstruction/lower urinary tract symptoms 02/22/2017   Chronic diastolic (congestive) heart failure (HCC)    Chronic venous stasis 03/07/2019   COPD (chronic obstructive pulmonary disease) (HCC)    Coronary artery calcification seen on CAT scan    Depression    Diabetic neuropathy (HCC) 09/11/2019   History of colon polyps 08/24/2018   Hypertension    Morbid obesity (HCC)    OSA (obstructive sleep apnea)    Pain due to onychomycosis of toenails of both feet 09/11/2019   Peripheral neuropathy 02/22/2017   Primary osteoarthritis, left shoulder 03/05/2017   PTSD (post-traumatic stress  disorder)    Pure hypercholesterolemia    QT prolongation 03/07/2019   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 Lynnette Caffey and Dr. Laneta Simmers   Seizures [facility])    Severe aortic stenosis    Sinus tachycardia 03/07/2019   Sleep apnea    CPAP   Type 2 diabetes mellitus with vascular disease (HCC) 09/11/2019    Past Surgical History:  Procedure Laterality Date   ENDOVENOUS ABLATION SAPHENOUS VEIN W/ LASER Right 08/20/2020   endovenous laser ablation right greater saphenous vein by Cari Caraway MD    ENDOVENOUS ABLATION SAPHENOUS VEIN W/ LASER Left 11/16/2022   endovenous laser ablation left greater saphenous vein by Cari Caraway MD   INTRAOPERATIVE TRANSTHORACIC ECHOCARDIOGRAM N/A 02/07/2023   Procedure: INTRAOPERATIVE TRANSTHORACIC ECHOCARDIOGRAM;  Surgeon: Orbie Pyo, MD;  Location: [facility] INVASIVE CV LAB;  Service: Open Heart Surgery;  Laterality: N/A;   JOINT REPLACEMENT     left knee replacement x 2   KNEE ARTHROSCOPY Bilateral    LEFT HEART CATH AND CORONARY ANGIOGRAPHY N/A 01/17/2018   Procedure: LEFT HEART CATH AND CORONARY ANGIOGRAPHY;  Surgeon: Marykay Lex, MD;  Location: [facility] INVASIVE CV LAB;  Service: Cardiovascular;  Laterality: N/A;   PRESSURE SENSOR/CARDIOMEMS N/A 08/26/2021   Procedure: PRESSURE SENSOR/CARDIOMEMS;  Surgeon: Laurey Morale, MD;  Location: [facility] INVASIVE CV LAB;  Service: Cardiovascular;  Laterality: N/A;   RIGHT HEART CATH N/A 08/26/2021   Procedure: RIGHT HEART CATH;  Surgeon: Laurey Morale, MD;  Location: [facility] INVASIVE CV LAB;  Service: Cardiovascular;  Laterality: N/A;   RIGHT HEART CATH AND CORONARY ANGIOGRAPHY N/A 01/26/2023   Procedure: RIGHT HEART CATH AND CORONARY ANGIOGRAPHY;  Surgeon: Orbie Pyo, MD;  Location: [facility] INVASIVE CV LAB;  Service: Cardiovascular;  Laterality: N/A;   TEE WITHOUT CARDIOVERSION N/A 08/26/2021   Procedure: TRANSESOPHAGEAL ECHOCARDIOGRAM (TEE);  Surgeon:  Laurey Morale, MD;  Location: [facility] ENDOSCOPY;  Service: Cardiovascular;  Laterality: N/A;   TOOTH EXTRACTION N/A 02/03/2023   Procedure: DENTAL RESTORATION/EXTRACTIONS;  Surgeon: [name], DMD;  Location: [facility] OR;  Service: Oral Surgery;  Laterality: N/A;   TRANSCATHETER AORTIC VALVE REPLACEMENT, TRANSFEMORAL Right 02/07/2023   Procedure: Transcatheter Aortic  Valve Replacement, Transfemoral;  Surgeon: Orbie Pyo, MD;  Location: [facility] INVASIVE CV LAB;  Service: Open Heart Surgery;  Laterality: Right;   UMBILICAL HERNIA REPAIR      Social History: Lives with family. Uses a walker to ambulate.  Denies any smoking alcohol use or illicit drug use.  Family History:  Family History  Problem Relation Age of Onset   CAD Maternal Grandfather    Diabetes Other    Diabetes Mellitus II Neg Hx    Colon cancer Neg Hx    Esophageal cancer Neg Hx    Inflammatory bowel disease Neg Hx    Liver disease Neg Hx    Pancreatic cancer Neg Hx    Rectal cancer Neg Hx    Stomach cancer Neg Hx    Sleep apnea Neg Hx      Review of Systems -unable to do due to somnolence  Physical Examination  Vitals:   05/07/23 0337 05/07/23 0645 05/07/23 0740 05/07/23 0945  BP: 111/69  105/73 127/81  Pulse: 96 87 89 87  Resp: 17 14 16 16   Temp: 98.4 F (36.9 C)   97.6 F (36.4 C)  TempSrc: Oral   Oral  SpO2: 92% 90% 93% 92%  Weight:      Height:        BP 127/81   Pulse 87   Temp 97.6 F (36.4 C) (Oral)   Resp 16   Ht 6\' 2"  (1.88 m)   Wt (!) 155 kg   SpO2 92%   BMI 43.87 kg/m   General appearance: Somnolent but arousable.  In no distress Head: Normocephalic, without obvious abnormality, atraumatic Eyes: conjunctivae/corneas clear. PERRL, EOM's intact.  Throat: lips, mucosa, and tongue normal; teeth and gums normal Neck: no adenopathy, no carotid bruit, no JVD, supple, symmetrical, trachea midline, and thyroid not enlarged, symmetric, no tenderness/mass/nodules Resp: clear to auscultation  bilaterally Cardio: regular rate and rhythm, S1, S2 normal, no murmur, click, rub or gallop GI: soft, non-tender; bowel sounds normal; no masses,  no organomegaly Extremities: extremities normal, atraumatic, no cyanosis or edema Pulses: 2+ and symmetric Skin: Skin color, texture, turgor normal. No rashes or lesions Lymph nodes: Cervical, supraclavicular, and axillary nodes normal. Neurologic: Has left-sided weakness from previous stroke.  No neurodeficits noted.   Labs on Admission: I have personally reviewed following labs and imaging studies  CBC: Recent Labs  Lab 05/07/23 0030 05/07/23 0051  WBC  --  14.9*  NEUTROABS  --  10.2*  HGB 16.0 14.4  HCT 47.0 44.3  MCV  --  85.9  PLT  --  303   Basic Metabolic Panel: Recent Labs  Lab 05/07/23 0030 05/07/23 0051  NA 138 135  K 2.6* 2.5*  CL 98 96*  CO2  --  23  GLUCOSE 139* 127*  BUN 49* 44*  CREATININE 1.70* 1.54*  CALCIUM  --  8.4*   GFR: Estimated Creatinine Clearance: 74.3 mL/min (A) (by C-G formula based on SCr of 1.54 mg/dL (H)). Liver Function Tests: Recent Labs  Lab 05/07/23 0051  AST 24  ALT 17  ALKPHOS 79  BILITOT 0.9  PROT 6.1*  ALBUMIN 3.2*    Coagulation Profile: Recent Labs  Lab 05/07/23 0051  INR 1.0      Radiological Exams on Admission: MR BRAIN WO CONTRAST  Result Date: 05/07/2023 CLINICAL DATA:  Initial evaluation for neuro deficit, stroke suspected. EXAM: MRI HEAD WITHOUT CONTRAST TECHNIQUE: Multiplanar, multiecho pulse sequences of the brain and surrounding structures were  obtained without intravenous contrast. COMPARISON:  Prior CTs from earlier the same day. FINDINGS: Brain: Cerebral volume within normal limits. Mild patchy T2/FLAIR hyperintensity involving the periventricular white matter, most characteristic of chronic microvascular ischemic disease, mild for age. No evidence for acute or subacute ischemia. Gray-white matter differentiation maintained. No areas of chronic cortical  infarction. No acute or chronic intracranial blood products. No mass lesion, midline shift or mass effect. No hydrocephalus or extra-axial fluid collection. Pituitary gland and suprasellar region within normal limits. Vascular: Major intracranial vascular flow voids are maintained. Skull and upper cervical spine: Craniocervical junction within normal limits. Bone marrow signal intensity normal. No scalp soft tissue abnormality. Sinuses/Orbits: Globes orbital soft tissues within normal limits. Scattered mucosal thickening present about the sphenoid ethmoidal sinuses. Paranasal sinuses are otherwise clear. No significant mastoid effusion. Other: None. IMPRESSION: 1. No acute intracranial abnormality. 2. Mild chronic microvascular ischemic disease for age. Electronically Signed   By: Rise Mu M.D.   On: 05/07/2023 03:02   CT ANGIO HEAD NECK W WO CM (CODE STROKE)  Result Date: 05/07/2023 CLINICAL DATA:  Initial evaluation for neuro deficit, stroke. EXAM: CT ANGIOGRAPHY HEAD AND NECK WITH AND WITHOUT CONTRAST TECHNIQUE: Multidetector CT imaging of the head and neck was performed using the standard protocol during bolus administration of intravenous contrast. Multiplanar CT image reconstructions and MIPs were obtained to evaluate the vascular anatomy. Carotid stenosis measurements (when applicable) are obtained utilizing NASCET criteria, using the distal internal carotid diameter as the denominator. RADIATION DOSE REDUCTION: This exam was performed according to the departmental dose-optimization program which includes automated exposure control, adjustment of the mA and/or kV according to patient size and/or use of iterative reconstruction technique. CONTRAST:  75mL OMNIPAQUE IOHEXOL 350 MG/ML SOLN COMPARISON:  Prior CT from earlier the same day as well as prior exam from 05/25/2022. FINDINGS: CTA NECK FINDINGS Aortic arch: Visualized aortic arch within normal limits for caliber. Bovine branching pattern  noted. No stenosis about the origin of the great vessels. Right carotid system: Right common and internal carotid arteries are patent without dissection. Mild atheromatous change about the right carotid bulb without hemodynamically significant stenosis. Left carotid system: Left common and internal carotid arteries are patent without dissection. Mild atheromatous change about the left carotid bulb without hemodynamically significant stenosis. Vertebral arteries: Both vertebral arteries arise from subclavian arteries. No proximal subclavian artery stenosis. Vertebral arteries are patent without stenosis or dissection. Skeleton: No discrete or worrisome osseous lesions. Moderate spondylosis, most pronounced at C5-6 and C6-7. Other neck: No other acute finding. Upper chest: No other acute finding. Review of the MIP images confirms the above findings CTA HEAD FINDINGS Anterior circulation: Mild atheromatous change about the carotid siphons without stenosis. A1 segments, anterior communicating artery complex. Anterior cerebral arteries patent without stenosis. No M1 stenosis or occlusion. No proximal MCA branch occlusion or high-grade stenosis. Distal to the branches perfused and symmetric. Posterior circulation: Both V4 segments patent without stenosis. Left vertebral artery dominant. Left PICA patent at its origin. Right PICA not well seen. Basilar patent without stenosis. Superior cerebellar and posterior cerebral arteries patent bilaterally. Venous sinuses: Patent allowing for timing the contrast bolus. Anatomic variants: None significant.  No aneurysm. Review of the MIP images confirms the above findings IMPRESSION: 1. Negative CTA for large vessel occlusion or other emergent finding. 2. Mild atheromatous change about the carotid bifurcations and carotid siphons without hemodynamically significant or correctable stenosis. These results were communicated to Dr. Derry Lory at 12:58 am on 05/07/2023 by text  page via the  [facility] messaging system. Electronically Signed   By: Rise Mu M.D.   On: 05/07/2023 01:01   DG Chest Portable 1 View  Result Date: 05/07/2023 CLINICAL DATA:  Stroke.  Aphasia and chest pain. EXAM: PORTABLE CHEST 1 VIEW COMPARISON:  02/06/2023 FINDINGS: Cardiac enlargement. No vascular congestion, edema, or consolidation. No pleural effusions. No pneumothorax. Mediastinal contours appear intact. Calcification of the aorta. IMPRESSION: Cardiac enlargement.  No evidence of active pulmonary disease. Electronically Signed   By: Burman Nieves M.D.   On: 05/07/2023 00:58   CT HEAD CODE STROKE WO CONTRAST  Result Date: 05/07/2023 CLINICAL DATA:  Code stroke. EXAM: CT HEAD WITHOUT CONTRAST TECHNIQUE: Contiguous axial images were obtained from the base of the skull through the vertex without intravenous contrast. RADIATION DOSE REDUCTION: This exam was performed according to the departmental dose-optimization program which includes automated exposure control, adjustment of the mA and/or kV according to patient size and/or use of iterative reconstruction technique. COMPARISON:  Prior study from 05/25/2022 FINDINGS: Brain: Cerebral volume within normal limits. No acute intracranial hemorrhage. No acute large vessel territory infarct. No mass lesion or midline shift. No hydrocephalus or extra-axial fluid collection. Vascular: No abnormal hyperdense vessel. Scattered vascular calcifications noted within the carotid siphons. Skull: Scalp soft tissues and calvarium within normal limits. Sinuses/Orbits: Globes and orbital soft tissues within normal limits. Scattered mucosal thickening present about the sphenoethmoidal sinuses. No significant mastoid effusion. Other: None. ASPECTS [facility] Stroke Program Early CT Score) - Ganglionic level infarction (caudate, lentiform nuclei, internal capsule, insula, M1-M3 cortex): 7 - Supraganglionic infarction (M4-M6 cortex): 3 Total score (0-10 with 10 being normal): 10  IMPRESSION: 1. No acute intracranial abnormality. 2. ASPECTS is 10. These results were communicated to Dr. Derry Lory at 12:26 am on 05/07/2023 by text page via the [facility] messaging system. Electronically Signed   By: Rise Mu M.D.   On: 05/07/2023 00:27    My interpretation of Electrocardiogram: Sinus rhythm in the 90s.  Multiple PVCs noted.  Interventricular conduction delay is present.  Similar to previous EKG.   Problem List  Principal Problem:   Hypokalemia Active Problems:   Chest pain   Chronic diastolic CHF (congestive heart failure) (HCC)   OSA (obstructive sleep apnea)   Type 2 diabetes mellitus with hyperlipidemia (HCC)   Essential hypertension   Seizure disorder (HCC)   AF (paroxysmal atrial fibrillation) (HCC)   COPD (chronic obstructive pulmonary disease) (HCC)   Hemiparesis affecting left side as late effect of cerebrovascular accident (CVA) (HCC)   S/P TAVR (transcatheter aortic valve replacement)   Assessment: This is a 67 year old Caucasian male with past medical history as stated earlier who was brought into the hospital due to concern for aphasia and for chest pain evaluation.  He is found to have mild acute renal failure along with severe hypokalemia.  MRI was negative for stroke.  Symptoms thought to be secondary to dehydration and electrolyte abnormalities.  Plan:  Acute kidney injury/hypokalemia Baseline renal function is normal.  Came in with creatinine of 1.5-1.7.  Potassium 2.5.  He will be given IV fluids.  Replace potassium aggressively.  He has been given magnesium already.  Will recheck labs later today and tomorrow.  Monitor urine output.  Avoid nephrotoxic agents.  Patient denied being started on any new medications recently.  Transient Aphasia in the setting of h/p previous stroke Patient was brought in due to aphasia.  Seen by neurology.  Underwent CT head, CT angio head and neck and  then MRI.  No acute stroke has been identified.  Symptoms  thought to be due to dehydration and electrolyte abnormalities.  Neurology has signed off.  Continue apixaban and statin.  No further workup at this time.  Chronic diastolic CHF/history of aortic stenosis/chest pain Volume status seems to be towards hypovolemia side.  Holding his diuretics for now. He is status post TAVR recently in July. EKG does not show any ischemic changes.  Troponins are normal.  Denies any chest pain currently.  Noted on telemetry for now.  Diabetes mellitus type 2 with hyperlipidemia SSI.  Monitor CBGs.  He is on basal insulin at home which will be continued at a lower dose. HbA1c 6.4 in July Continue with statin.  Paroxysmal atrial fibrillation Stable.  Continue with metoprolol and apixaban.  Seizure disorder Followed by Dr. Teresa Coombs with neurology.  Continue Keppra and Vimpat.  Also noted to be on Lyrica as per his neurologist which will also be continued but will start from tonight since he is somnolent this morning.  Obstructive sleep apnea CPAP  History of anxiety and depression Continue with the psychotropic medications.  History of COPD Stable.  DVT Prophylaxis: On apixaban Code Status: DNR Family Communication: No family at bedside Disposition: PT and OT eval.  Hopefully return home when improved Consults called: Neurology has signed off Admission Status: Observation    Severity of Illness: The appropriate patient status for this patient is OBSERVATION. Observation status is judged to be reasonable and necessary in order to provide the required intensity of service to ensure the patient's safety. The patient's presenting symptoms, physical exam findings, and initial radiographic and laboratory data in the context of their medical condition is felt to place them at decreased risk for further clinical deterioration. Furthermore, it is anticipated that the patient will be medically stable for discharge from the hospital within 2 midnights of admission.     Further management decisions will depend on results of further testing and patient's response to treatment.   Dellie Piasecki [employer]  [employer] on [email or web address]  05/07/2023, 10:28 AM

## 2023-05-07 NOTE — Evaluation (Signed)
Clinical/Bedside Swallow Evaluation Patient Details  Name: Lawrence [name] MRN: [id number] Date of Birth: [birth year]  Today's Date: 05/07/2023 Time: SLP Start Time (ACUTE ONLY): 1350 SLP Stop Time (ACUTE ONLY): 1404 SLP Time Calculation (min) (ACUTE ONLY): 14 min  Past Medical History:  Past Medical History:  Diagnosis Date   Acquired dilation of ascending aorta and aortic root (HCC)    40mm by echo 01/2021   Adenomatous colon polyp 2007   Anemia    Anxiety    Asthma    BPH without obstruction/lower urinary tract symptoms 02/22/2017   Chronic diastolic (congestive) heart failure (HCC)    Chronic venous stasis 03/07/2019   COPD (chronic obstructive pulmonary disease) (HCC)    Coronary artery calcification seen on CAT scan    Depression    Diabetic neuropathy (HCC) 09/11/2019   History of colon polyps 08/24/2018   Hypertension    Morbid obesity (HCC)    OSA (obstructive sleep apnea)    Pain due to onychomycosis of toenails of both feet 09/11/2019   Peripheral neuropathy 02/22/2017   Primary osteoarthritis, left shoulder 03/05/2017   PTSD (post-traumatic stress disorder)    Pure hypercholesterolemia    QT prolongation 03/07/2019   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 Lynnette Caffey and Dr. Laneta Simmers   Seizures [facility])    Severe aortic stenosis    Sinus tachycardia 03/07/2019   Sleep apnea    CPAP   Type 2 diabetes mellitus with vascular disease (HCC) 09/11/2019   Past Surgical History:  Past Surgical History:  Procedure Laterality Date   ENDOVENOUS ABLATION SAPHENOUS VEIN W/ LASER Right 08/20/2020   endovenous laser ablation right greater saphenous vein by Cari Caraway MD    ENDOVENOUS ABLATION SAPHENOUS VEIN W/ LASER Left 11/16/2022   endovenous laser ablation left greater saphenous vein by Cari Caraway MD   INTRAOPERATIVE TRANSTHORACIC ECHOCARDIOGRAM N/A 02/07/2023   Procedure: INTRAOPERATIVE TRANSTHORACIC  ECHOCARDIOGRAM;  Surgeon: Orbie Pyo, MD;  Location: [facility] INVASIVE CV LAB;  Service: Open Heart Surgery;  Laterality: N/A;   JOINT REPLACEMENT     left knee replacement x 2   KNEE ARTHROSCOPY Bilateral    LEFT HEART CATH AND CORONARY ANGIOGRAPHY N/A 01/17/2018   Procedure: LEFT HEART CATH AND CORONARY ANGIOGRAPHY;  Surgeon: Marykay Lex, MD;  Location: [facility] INVASIVE CV LAB;  Service: Cardiovascular;  Laterality: N/A;   PRESSURE SENSOR/CARDIOMEMS N/A 08/26/2021   Procedure: PRESSURE SENSOR/CARDIOMEMS;  Surgeon: Laurey Morale, MD;  Location: [facility] INVASIVE CV LAB;  Service: Cardiovascular;  Laterality: N/A;   RIGHT HEART CATH N/A 08/26/2021   Procedure: RIGHT HEART CATH;  Surgeon: Laurey Morale, MD;  Location: [facility] INVASIVE CV LAB;  Service: Cardiovascular;  Laterality: N/A;   RIGHT HEART CATH AND CORONARY ANGIOGRAPHY N/A 01/26/2023   Procedure: RIGHT HEART CATH AND CORONARY ANGIOGRAPHY;  Surgeon: Orbie Pyo, MD;  Location: [facility] INVASIVE CV LAB;  Service: Cardiovascular;  Laterality: N/A;   TEE WITHOUT CARDIOVERSION N/A 08/26/2021   Procedure: TRANSESOPHAGEAL ECHOCARDIOGRAM (TEE);  Surgeon: Laurey Morale, MD;  Location: [facility] ENDOSCOPY;  Service: Cardiovascular;  Laterality: N/A;   TOOTH EXTRACTION N/A 02/03/2023   Procedure: DENTAL RESTORATION/EXTRACTIONS;  Surgeon: [name], DMD;  Location: [facility] OR;  Service: Oral Surgery;  Laterality: N/A;   TRANSCATHETER AORTIC VALVE REPLACEMENT, TRANSFEMORAL Right 02/07/2023   Procedure: Transcatheter Aortic Valve Replacement, Transfemoral;  Surgeon: Orbie Pyo, MD;  Location: [facility] INVASIVE CV LAB;  Service: Open Heart  Surgery;  Laterality: Right;   UMBILICAL HERNIA REPAIR     HPI:  Lawrence [name] is a 67 y.o. male with significant past medical history including atrial fibrillation on anticoagulation, history of severe aortic stenosis status post TAVR recently, prior stroke with residual left-sided weakness, diabetes mellitus type 2 with  neuropathy, history of seizure activity on antiepileptics, coronary artery disease, COPD, obstructive sleep apnea who was brought into the hospital by EMS after family called EMS due to concern for stroke.   Underwent CT head, CT angiogram head and neck followed by MRI which was negative for stroke.  Blood work showed that patient was dehydrated with acute kidney injury and had severe hypokalemia.    Assessment / Plan / Recommendation  Clinical Impression  Pt was seen for a bedside swallow evaluation and he presents with suspected functional oropharyngeal swallowing abilities.  Pt was encountered awake/alert in bed and he was agreeable to this evaluation.  Oral mechanism exam was unremarkable.  Pt consumed trials of thin liquid, puree, and regular solids.  Pt fed himself independently and he demonstrated good bolus acceptance, timely mastication, suspected timely AP transport/swallow initiation, and consistent hyolaryngeal elevation/excursion to observation and palpation.  No overt s/sx of aspiration were observed with any trials.  Recommend diet upgrade to regular solids and thin liquids with medications administered whole with liquid.  No further skilled ST is warranted at this time.  Please re-consult if additional needs arise.    SLP Visit Diagnosis: Dysphagia, unspecified (R13.10)    Aspiration Risk  No limitations    Diet Recommendation Regular;Thin liquid    Liquid Administration via: Cup;Straw Medication Administration: Whole meds with liquid Supervision: Patient able to self feed Compensations: Slow rate;Small sips/bites    Other  Recommendations Oral Care Recommendations: Oral care BID    Recommendations for follow up therapy are one component of a multi-disciplinary discharge planning process, led by the attending physician.  Recommendations may be updated based on patient status, additional functional criteria and insurance authorization.  Follow up Recommendations No SLP follow up       Assistance Recommended at Discharge    Functional Status Assessment Patient has had a recent decline in their functional status and demonstrates the ability to make significant improvements in function in a reasonable and predictable amount of time.  Frequency and Duration            Prognosis Prognosis for improved oropharyngeal function: Good      Swallow Study   General Date of Onset: 05/07/23 HPI: Lawrence [name] is a 67 y.o. male with significant past medical history including atrial fibrillation on anticoagulation, history of severe aortic stenosis status post TAVR recently, prior stroke with residual left-sided weakness, diabetes mellitus type 2 with neuropathy, history of seizure activity on antiepileptics, coronary artery disease, COPD, obstructive sleep apnea who was brought into the hospital by EMS after family called EMS due to concern for stroke.   Underwent CT head, CT angiogram head and neck followed by MRI which was negative for stroke.  Blood work showed that patient was dehydrated with acute kidney injury and had severe hypokalemia. Type of Study: Bedside Swallow Evaluation Previous Swallow Assessment: N/A Diet Prior to this Study: Dysphagia 1 (pureed);Thin liquids (Level 0) Temperature Spikes Noted: No Respiratory Status: Nasal cannula History of Recent Intubation: No Behavior/Cognition: Alert;Cooperative;Pleasant mood Oral Cavity Assessment: Within Functional Limits Oral Care Completed by SLP: No Oral Cavity - Dentition: Adequate natural dentition;Missing dentition Vision: Functional for self-feeding Self-Feeding Abilities:  Able to feed self Patient Positioning: Upright in bed Baseline Vocal Quality: Normal Volitional Cough: Strong Volitional Swallow: Able to elicit    Oral/Motor/Sensory Function Overall Oral Motor/Sensory Function: Within functional limits   Ice Chips Ice chips: Not tested   Thin Liquid Thin Liquid: Within functional limits    Nectar  Thick Nectar Thick Liquid: Not tested   Honey Thick Honey Thick Liquid: Not tested   Puree Puree: Within functional limits Presentation: Self Fed   Solid     Solid: Within functional limits Presentation: Self Fed     Eino Farber, M.S., CCC-SLP Acute Rehabilitation Services Office: [phone]  Shanon Rosser Jami Bogdanski 05/07/2023,2:09 PM

## 2023-05-08 ENCOUNTER — Inpatient Hospital Stay (HOSPITAL_COMMUNITY): Payer: No Typology Code available for payment source

## 2023-05-08 DIAGNOSIS — Z87891 Personal history of nicotine dependence: Secondary | ICD-10-CM | POA: Diagnosis not present

## 2023-05-08 DIAGNOSIS — I5032 Chronic diastolic (congestive) heart failure: Secondary | ICD-10-CM | POA: Diagnosis present

## 2023-05-08 DIAGNOSIS — E860 Dehydration: Secondary | ICD-10-CM | POA: Diagnosis present

## 2023-05-08 DIAGNOSIS — Z881 Allergy status to other antibiotic agents status: Secondary | ICD-10-CM | POA: Diagnosis not present

## 2023-05-08 DIAGNOSIS — J4489 Other specified chronic obstructive pulmonary disease: Secondary | ICD-10-CM | POA: Diagnosis present

## 2023-05-08 DIAGNOSIS — G40909 Epilepsy, unspecified, not intractable, without status epilepticus: Secondary | ICD-10-CM | POA: Diagnosis present

## 2023-05-08 DIAGNOSIS — Z794 Long term (current) use of insulin: Secondary | ICD-10-CM | POA: Diagnosis not present

## 2023-05-08 DIAGNOSIS — N281 Cyst of kidney, acquired: Secondary | ICD-10-CM | POA: Diagnosis not present

## 2023-05-08 DIAGNOSIS — R531 Weakness: Secondary | ICD-10-CM | POA: Diagnosis present

## 2023-05-08 DIAGNOSIS — N400 Enlarged prostate without lower urinary tract symptoms: Secondary | ICD-10-CM | POA: Diagnosis present

## 2023-05-08 DIAGNOSIS — I69354 Hemiplegia and hemiparesis following cerebral infarction affecting left non-dominant side: Secondary | ICD-10-CM | POA: Diagnosis not present

## 2023-05-08 DIAGNOSIS — E1169 Type 2 diabetes mellitus with other specified complication: Secondary | ICD-10-CM | POA: Diagnosis present

## 2023-05-08 DIAGNOSIS — N179 Acute kidney failure, unspecified: Secondary | ICD-10-CM | POA: Diagnosis not present

## 2023-05-08 DIAGNOSIS — E1140 Type 2 diabetes mellitus with diabetic neuropathy, unspecified: Secondary | ICD-10-CM | POA: Diagnosis present

## 2023-05-08 DIAGNOSIS — I69320 Aphasia following cerebral infarction: Secondary | ICD-10-CM | POA: Diagnosis not present

## 2023-05-08 DIAGNOSIS — E7800 Pure hypercholesterolemia, unspecified: Secondary | ICD-10-CM | POA: Diagnosis present

## 2023-05-08 DIAGNOSIS — Z6841 Body Mass Index (BMI) 40.0 and over, adult: Secondary | ICD-10-CM | POA: Diagnosis not present

## 2023-05-08 DIAGNOSIS — Z953 Presence of xenogenic heart valve: Secondary | ICD-10-CM | POA: Diagnosis not present

## 2023-05-08 DIAGNOSIS — I2510 Atherosclerotic heart disease of native coronary artery without angina pectoris: Secondary | ICD-10-CM | POA: Diagnosis present

## 2023-05-08 DIAGNOSIS — I110 Hypertensive heart disease with heart failure: Secondary | ICD-10-CM | POA: Diagnosis present

## 2023-05-08 DIAGNOSIS — G4733 Obstructive sleep apnea (adult) (pediatric): Secondary | ICD-10-CM | POA: Diagnosis present

## 2023-05-08 DIAGNOSIS — E876 Hypokalemia: Secondary | ICD-10-CM | POA: Diagnosis not present

## 2023-05-08 DIAGNOSIS — I480 Paroxysmal atrial fibrillation: Secondary | ICD-10-CM | POA: Diagnosis present

## 2023-05-08 DIAGNOSIS — Z66 Do not resuscitate: Secondary | ICD-10-CM | POA: Diagnosis present

## 2023-05-08 DIAGNOSIS — Z96652 Presence of left artificial knee joint: Secondary | ICD-10-CM | POA: Diagnosis present

## 2023-05-08 LAB — COMPREHENSIVE METABOLIC PANEL
ALT: 17 U/L (ref 0–44)
AST: 18 U/L (ref 15–41)
Albumin: 3 g/dL — ABNORMAL LOW (ref 3.5–5.0)
Alkaline Phosphatase: 61 U/L (ref 38–126)
Anion gap: 13 (ref 5–15)
BUN: 33 mg/dL — ABNORMAL HIGH (ref 8–23)
CO2: 26 mmol/L (ref 22–32)
Calcium: 8.8 mg/dL — ABNORMAL LOW (ref 8.9–10.3)
Chloride: 95 mmol/L — ABNORMAL LOW (ref 98–111)
Creatinine, Ser: 1.34 mg/dL — ABNORMAL HIGH (ref 0.61–1.24)
GFR, Estimated: 58 mL/min — ABNORMAL LOW (ref >60)
Glucose, Bld: 130 mg/dL — ABNORMAL HIGH (ref 70–99)
Potassium: 2.9 mmol/L — ABNORMAL LOW (ref 3.5–5.1)
Sodium: 134 mmol/L — ABNORMAL LOW (ref 135–145)
Total Bilirubin: 0.4 mg/dL (ref 0.3–1.2)
Total Protein: 5.9 g/dL — ABNORMAL LOW (ref 6.5–8.1)

## 2023-05-08 LAB — BASIC METABOLIC PANEL
Anion gap: 11 (ref 5–15)
BUN: 22 mg/dL (ref 8–23)
CO2: 25 mmol/L (ref 22–32)
Calcium: 8.9 mg/dL (ref 8.9–10.3)
Chloride: 98 mmol/L (ref 98–111)
Creatinine, Ser: 0.92 mg/dL (ref 0.61–1.24)
GFR, Estimated: >60 mL/min (ref >60)
Glucose, Bld: 138 mg/dL — ABNORMAL HIGH (ref 70–99)
Potassium: 3.3 mmol/L — ABNORMAL LOW (ref 3.5–5.1)
Sodium: 134 mmol/L — ABNORMAL LOW (ref 135–145)

## 2023-05-08 LAB — CBC
HCT: 39.4 % (ref 39.0–52.0)
Hemoglobin: 13.1 g/dL (ref 13.0–17.0)
MCH: 28.2 pg (ref 26.0–34.0)
MCHC: 33.2 g/dL (ref 30.0–36.0)
MCV: 84.9 fL (ref 80.0–100.0)
Platelets: 263 10*3/uL (ref 150–400)
RBC: 4.64 MIL/uL (ref 4.22–5.81)
RDW: 15.2 % (ref 11.5–15.5)
WBC: 12.5 10*3/uL — ABNORMAL HIGH (ref 4.0–10.5)
nRBC: 0 % (ref 0.0–0.2)

## 2023-05-08 LAB — MAGNESIUM: Magnesium: 1.7 mg/dL (ref 1.7–2.4)

## 2023-05-08 LAB — GLUCOSE, CAPILLARY
Glucose-Capillary: 156 mg/dL — ABNORMAL HIGH (ref 70–99)
Glucose-Capillary: 119 mg/dL — ABNORMAL HIGH (ref 70–99)
Glucose-Capillary: 160 mg/dL — ABNORMAL HIGH (ref 70–99)
Glucose-Capillary: 148 mg/dL — ABNORMAL HIGH (ref 70–99)
Glucose-Capillary: 153 mg/dL — ABNORMAL HIGH (ref 70–99)
Glucose-Capillary: 140 mg/dL — ABNORMAL HIGH (ref 70–99)
Glucose-Capillary: 137 mg/dL — ABNORMAL HIGH (ref 70–99)

## 2023-05-08 MED ORDER — POTASSIUM CHLORIDE IN NACL 20-0.9 MEQ/L-% IV SOLN
INTRAVENOUS | Status: DC
  Filled 2023-05-08: qty 1000

## 2023-05-08 MED ORDER — POTASSIUM CHLORIDE CRYS ER 20 MEQ PO TBCR
40.0000 meq | EXTENDED_RELEASE_TABLET | ORAL | Status: AC
  Administered 2023-05-08 (×2): 40 meq via ORAL
  Filled 2023-05-08: qty 2

## 2023-05-08 MED ORDER — POTASSIUM CHLORIDE CRYS ER 20 MEQ PO TBCR: 40.0000 meq | EXTENDED_RELEASE_TABLET | ORAL | Status: DC

## 2023-05-08 MED ORDER — POTASSIUM CHLORIDE CRYS ER 20 MEQ PO TBCR
40.0000 meq | EXTENDED_RELEASE_TABLET | ORAL | Status: AC
  Administered 2023-05-08 (×2): 40 meq via ORAL
  Filled 2023-05-08 (×2): qty 2

## 2023-05-08 MED ORDER — MAGNESIUM SULFATE IN D5W 1-5 GM/100ML-% IV SOLN
1.0000 g | Freq: Once | INTRAVENOUS | Status: AC
  Administered 2023-05-08: 1 g via INTRAVENOUS
  Filled 2023-05-08: qty 100

## 2023-05-08 MED ORDER — ACETAMINOPHEN 325 MG PO TABS
325.0000 mg | ORAL_TABLET | Freq: Once | ORAL | Status: AC
  Administered 2023-05-08: 325 mg via ORAL
  Filled 2023-05-08: qty 1

## 2023-05-08 NOTE — Evaluation (Signed)
Occupational Therapy Evaluation Patient Details Name: Lawrence [name] MRN: [id number] DOB: [birth year] Today's Date: 05/08/2023   History of Present Illness Pt is 67 yo male presented on 05/07/23  with concern for stroke due to aphasia.  MRI was negative for stroke.  Pt was found to be dehydrated with AKI and severe hypokalemia.  Pt has been receiving potassium today.  Pt with hx including but not limited to  atrial fibrillation on anticoagulation, history of severe aortic stenosis status post TAVR recently, prior stroke with residual left-sided weakness, diabetes mellitus type 2 with neuropathy, history of seizure activity on antiepileptics, coronary artery disease, COPD, obstructive sleep apnea   Clinical Impression   Ray was evaluated s/p the above admission list. He needs assist for ADLs and has a caregiver 4 days/week, he uses a rollator for short distances and a scooter for community mobility at baseline. Upon evaluation the pt was limited by baseline weakness, decreased activity tolerance, unsteady gait and impaired safety awareness. Overall he needed CGA with RW and cues for transfers and short mobility. Due to the deficits listed below the pt also needs up to minA for LB ADLs and set up A for UB ADLs. He sponge bathed while sitting at the sink. Pt will benefit from continued acute OT services and HHOT.        If plan is discharge home, recommend the following: A little help with walking and/or transfers;A little help with bathing/dressing/bathroom;Assistance with cooking/housework;Direct supervision/assist for medications management;Direct supervision/assist for financial management;Assist for transportation;Help with stairs or ramp for entrance    Functional Status Assessment  Patient has had a recent decline in their functional status and demonstrates the ability to make significant improvements in function in a reasonable and predictable amount of time.  Equipment Recommendations  None  recommended by OT       Precautions / Restrictions Precautions Precautions: Fall Restrictions Weight Bearing Restrictions: No      Mobility Bed Mobility Overal bed mobility: Needs Assistance Bed Mobility: Supine to Sit     Supine to sit: Supervision          Transfers Overall transfer level: Needs assistance Equipment used: Rolling walker (2 wheels) Transfers: Sit to/from Stand Sit to Stand: From elevated surface, Contact guard assist           General transfer comment: cues for hand placement      Balance Overall balance assessment: Needs assistance Sitting-balance support: No upper extremity supported Sitting balance-Leahy Scale: Good     Standing balance support: Bilateral upper extremity supported, Reliant on assistive device for balance Standing balance-Leahy Scale: Poor                             ADL either performed or assessed with clinical judgement   ADL Overall ADL's : Needs assistance/impaired Eating/Feeding: Independent;Sitting   Grooming: Modified independent Grooming Details (indicate cue type and reason): sitting at the sink Upper Body Bathing: Set up;Sitting   Lower Body Bathing: Minimal assistance;Sit to/from stand Lower Body Bathing Details (indicate cue type and reason): assist for distal LEs Upper Body Dressing : Set up;Sitting   Lower Body Dressing: Minimal assistance   Toilet Transfer: Contact guard assist;Ambulation;Rolling walker (2 wheels)   Toileting- Clothing Manipulation and Hygiene: Minimal assistance;Sit to/from stand       Functional mobility during ADLs: Contact guard assist General ADL Comments: cues for safety, pt is likely at/near his functional baseline  Vision Baseline Vision/History: 1 Wears glasses Vision Assessment?: No apparent visual deficits     Perception Perception: Not tested       Praxis Praxis: Not tested       Pertinent Vitals/Pain Pain Assessment Pain Assessment:  No/denies pain     Extremity/Trunk Assessment Upper Extremity Assessment Upper Extremity Assessment: Overall WFL for tasks assessed   Lower Extremity Assessment Lower Extremity Assessment: Defer to PT evaluation   Cervical / Trunk Assessment Cervical / Trunk Assessment: Other exceptions Cervical / Trunk Exceptions: some limitations due to body habitus   Communication Communication Communication: No apparent difficulties   Cognition Arousal: Alert Behavior During Therapy: WFL for tasks assessed/performed Overall Cognitive Status: Within Functional Limits for tasks assessed                                 General Comments: cog WFL for basic orientation and command following, pt and SO reports he is "almost" back to baseline     General Comments  VSS on RA, wife present     Home Living Family/patient expects to be discharged to:: Private residence Living Arrangements: Spouse/significant other Available Help at Discharge: Family;Available 24 hours/day;Personal care attendant Type of Home: Apartment Home Access: Level entry     Home Layout: One level     Bathroom Shower/Tub: Sponge bathes at baseline   Bathroom Toilet: Standard Bathroom Accessibility: No   Home Equipment: Rollator (4 wheels);Electric scooter;BSC/3in1;Hospital bed;Lift chair   Additional Comments: Has a PCA 4 days a week for about 4 hours a day (13 hour week); wears CPAP at night, no O2 at home      Prior Functioning/Environment Prior Level of Function : Needs assist             Mobility Comments: Rollator for household mobility (short distances); electric scooter in community dwellings. ADLs Comments: aide assists with bathing/dressing, provides set up A for grooming tasks, light household chores,  utilizes transportation services. uses BSC does not go into bathroom        OT Problem List: Decreased strength;Decreased range of motion;Decreased activity tolerance;Impaired balance  (sitting and/or standing);Decreased safety awareness;Decreased knowledge of use of DME or AE;Decreased knowledge of precautions      OT Treatment/Interventions: Self-care/ADL training;Therapeutic exercise;DME and/or AE instruction;Therapeutic activities;Balance training;Patient/family education    OT Goals(Current goals can be found in the care plan section) Acute Rehab OT Goals Patient Stated Goal: home tomorrow OT Goal Formulation: With patient Time For Goal Achievement: 05/22/23 Potential to Achieve Goals: Good ADL Goals Pt Will Perform Grooming: with modified independence Pt Will Perform Lower Body Dressing: with modified independence Pt Will Transfer to Toilet: with modified independence  OT Frequency: Min 1X/week       AM-PAC OT "6 Clicks" Daily Activity     Outcome Measure Help from another person eating meals?: None Help from another person taking care of personal grooming?: A Little Help from another person toileting, which includes using toliet, bedpan, or urinal?: A Little Help from another person bathing (including washing, rinsing, drying)?: A Little Help from another person to put on and taking off regular upper body clothing?: A Little Help from another person to put on and taking off regular lower body clothing?: A Little 6 Click Score: 19   End of Session Equipment Utilized During Treatment: Gait belt;Rolling walker (2 wheels) Nurse Communication: Mobility status  Activity Tolerance: Patient tolerated treatment well Patient left: in chair;with call  bell/phone within reach;with family/visitor present  OT Visit Diagnosis: Unsteadiness on feet (R26.81);Other abnormalities of gait and mobility (R26.89);Muscle weakness (generalized) (M62.81)                Time: 1191-4782 OT Time Calculation (min): 29 min Charges:  OT General Charges $OT Visit: 1 Visit OT Evaluation $OT Eval Moderate Complexity: 1 Mod OT Treatments $Self Care/Home Management : 8-22  mins  Derenda Mis, OTR/L Acute Rehabilitation Services Office [phone] Secure Chat Communication Preferred   Donia Pounds 05/08/2023, 10:06 AM

## 2023-05-08 NOTE — Progress Notes (Addendum)
Transition of Care [facility]) - Inpatient Brief Assessment   Patient Details  Name: Lawrence [name] MRN: [id number] Date of Birth: 10-14-1955  Transition of Care [facility]) CM/SW Contact:    Janae Bridgeman, RN Phone Number: 05/08/2023, 4:38 PM   Clinical Narrative: CM met with the patient at the bedside to discuss TOC needs.  The patient admitted for aphagia and Ct negative for CVA.  Patient lives at home with his significant other and plans to return home with home health services when stable.  Patient is active with [facility].  Patient no longer active with home health services.  The patient was provided with Medicare choice regarding home health and patient would prefer re-instatement with [facility] for PT/OT.  Home Health orders placed and [facility] updated.  Patient has all DME at home.  The patient has CPAP at home and is currently on RA.  I called and the [facility] and left a detailed voicemail [facility] with [facility].  I called Tresa Endo, CM with [facility] and HH orders placed - [facility] with start authorization for home health.   Transition of Care Asessment: Insurance and Status: (P) Insurance coverage has been reviewed Patient has primary care physician: (P) Yes Home environment has been reviewed: (P) Home with significant other Prior level of function:: (P) assistance through home health Prior/Current Home Services: (P) Current home services (Active with [facility]) Social Determinants of Health Reivew: (P) SDOH reviewed interventions complete Readmission risk has been reviewed: (P) Yes Transition of care needs: (P) transition of care needs identified, TOC will continue to follow

## 2023-05-08 NOTE — Discharge Instructions (Signed)

## 2023-05-08 NOTE — Progress Notes (Signed)
[facility] PROGRESS NOTE   Lawrence [name] DGL:[id number] DOB: 16-Aug-1955 DOA: 05/07/2023  PCP: [name], PA-C  Brief History: 67 y.o. male with significant past medical history including atrial fibrillation on anticoagulation, history of severe aortic stenosis status post TAVR recently, prior stroke with residual left-sided weakness, diabetes mellitus type 2 with neuropathy, history of seizure activity on antiepileptics, coronary artery disease, COPD, obstructive sleep apnea who was brought into the hospital by EMS after family called EMS due to concern for stroke.  Patient was noted to be aphasic.  Patient was brought into the ED.  A code stroke was called.  Patient was evaluated by neurology.  Underwent CT head, CT angiogram head and neck followed by MRI which was negative for stroke.  Blood work showed that patient was dehydrated with acute kidney injury and had severe hypokalemia.  He was hospitalized for further management.    Consultants: Neurology  Procedures: None    Subjective/Interval History: Patient feels well.  Denies any complaints this morning.  Lower extremity edema is stable.  His significant other is at the bedside.    Assessment/Plan:  Acute kidney injury/hypokalemia Came in with a creatinine of 1.7 and a potassium of 2.5.  Baseline renal function is normal. Patient was hydrated.  Was given potassium supplements.  Diuretics were placed on hold.  Renal function has improved but not back to baseline yet.  Will proceed with the renal ultrasound. Potassium remains low as well.  Will supplemented aggressively.  Recheck labs later today.  Monitor urine output.  Avoid nephrotoxic agents.  Transient aphasia in the setting of previous history of stroke Symptoms thought to be secondary to dehydration and electrolyte abnormalities.  Neurological workup has been unremarkable.  MRI did not show any new stroke. Continue with apixaban and statin.  No further  neurological workup at this time.  Chronic diastolic CHF/history of aortic stenosis Volume status seems to be stable.  Holding his diuretics. He is status post TAVR recently in July Experienced ventricular bigeminy earlier today.  No symptoms. Troponins have been normal.  No chest pain currently.  Diabetes mellitus type 2 with hyperlipidemia Continue with SSI.  Monitor CBGs.  Continue with statin.  Paroxysmal atrial fibrillation Continue with metoprolol and apixaban.  Seizure disorder Followed by Dr. Olegario Messier with neurology.  Continue Keppra and Vimpat.  Continue Lyrica.  Obstructive sleep apnea CPAP  History of anxiety and depression Continue with the psychotropic medications.  History of COPD Stable.  Morbid obesity Estimated body mass index is 43.87 kg/m as calculated from the following:   Height as of this encounter: 6\' 2"  (1.88 m).   Weight as of this encounter: 155 kg.  DVT Prophylaxis: On apixaban Code Status: DNR Family Communication: Discussed with patient and his significant other Disposition Plan: Home with home health when improved  Status is: Observation The patient will require care spanning > 2 midnights and should be moved to inpatient because: Continued need for IV fluids and potassium replacement      Medications: Scheduled:  allopurinol  200 mg Oral BID   apixaban  5 mg Oral BID   atorvastatin  40 mg Oral Daily   ferrous sulfate  324 mg Oral Q breakfast   fluticasone furoate-vilanterol  1 puff Inhalation Daily   And   umeclidinium bromide  1 puff Inhalation Daily   insulin aspart  0-15 Units Subcutaneous TID WC   insulin aspart  0-5 Units Subcutaneous QHS   insulin glargine-yfgn  20 Units Subcutaneous  Daily   lacosamide  150 mg Oral BID   levETIRAcetam  500 mg Oral BID   metoprolol succinate  50 mg Oral Daily   potassium chloride  40 mEq Oral Q4H   prazosin  2 mg Oral QHS   pregabalin  150 mg Oral TID   venlafaxine XR  225 mg Oral Q  breakfast   Continuous:  0.9 % NaCl with KCl 20 mEq / L 100 mL/hr at 05/08/23 4098   JXB:[id number] **OR** acetaminophen, ondansetron **OR** ondansetron (ZOFRAN) IV  Antibiotics: Anti-infectives (From admission, onward)    None       Objective:  Vital Signs  Vitals:   05/08/23 0059 05/08/23 0527 05/08/23 0757 05/08/23 0836  BP: 93/82 106/66 126/61   Pulse: (!) 50 (!) 40 (!) 59   Resp: 18 18    Temp: 97.8 F (36.6 C) 98.1 F (36.7 C) 97.8 F (36.6 C)   TempSrc: Oral Oral    SpO2: 90% 94% 93% 93%  Weight:      Height:        Intake/Output Summary (Last 24 hours) at 05/08/2023 1029 Last data filed at 05/08/2023 0556 Gross per 24 hour  Intake 1795.19 ml  Output 1025 ml  Net 770.19 ml   Filed Weights   05/07/23 0330  Weight: (!) 155 kg    General appearance: Awake alert.  In no distress Resp: Clear to auscultation bilaterally.  Normal effort Cardio: S1-S2 is normal regular.  No S3-S4.  No rubs murmurs or bruit GI: Abdomen is soft.  Nontender nondistended.  Bowel sounds are present normal.  No masses organomegaly Extremities: Minimal edema bilateral lower extremities but better per patient and significant other Neurologic: Alert and oriented x3.  No focal neurological deficits.    Lab Results:  Data Reviewed: I have personally reviewed following labs and reports of the imaging studies  CBC: Recent Labs  Lab 05/07/23 0030 05/07/23 0051 05/08/23 0213  WBC  --  14.9* 12.5*  NEUTROABS  --  10.2*  --   HGB 16.0 14.4 13.1  HCT 47.0 44.3 39.4  MCV  --  85.9 84.9  PLT  --  303 263    Basic Metabolic Panel: Recent Labs  Lab 05/07/23 0030 05/07/23 0051 05/07/23 1426 05/08/23 0219  NA 138 135 139 134*  K 2.6* 2.5* 2.9* 2.9*  CL 98 96* 93* 95*  CO2  --  23 28 26   GLUCOSE 139* 127* 146* 130*  BUN 49* 44* 40* 33*  CREATININE 1.70* 1.54* 1.29* 1.34*  CALCIUM  --  8.4* 9.1 8.8*  MG  --   --   --  1.7    GFR: Estimated Creatinine Clearance: 85.4  mL/min (A) (by C-G formula based on SCr of 1.34 mg/dL (H)).  Liver Function Tests: Recent Labs  Lab 05/07/23 0051 05/08/23 0219  AST 24 18  ALT 17 17  ALKPHOS 79 61  BILITOT 0.9 0.4  PROT 6.1* 5.9*  ALBUMIN 3.2* 3.0*    Coagulation Profile: Recent Labs  Lab 05/07/23 0051  INR 1.0    CBG: Recent Labs  Lab 05/07/23 1532 05/07/23 2049 05/08/23 0100 05/08/23 0530 05/08/23 0802  GLUCAP 249* 177* 156* 119* 160*    Radiology Studies: MR BRAIN WO CONTRAST  Result Date: 05/07/2023 CLINICAL DATA:  Initial evaluation for neuro deficit, stroke suspected. EXAM: MRI HEAD WITHOUT CONTRAST TECHNIQUE: Multiplanar, multiecho pulse sequences of the brain and surrounding structures were obtained without intravenous contrast. COMPARISON:  Prior CTs from  earlier the same day. FINDINGS: Brain: Cerebral volume within normal limits. Mild patchy T2/FLAIR hyperintensity involving the periventricular white matter, most characteristic of chronic microvascular ischemic disease, mild for age. No evidence for acute or subacute ischemia. Gray-white matter differentiation maintained. No areas of chronic cortical infarction. No acute or chronic intracranial blood products. No mass lesion, midline shift or mass effect. No hydrocephalus or extra-axial fluid collection. Pituitary gland and suprasellar region within normal limits. Vascular: Major intracranial vascular flow voids are maintained. Skull and upper cervical spine: Craniocervical junction within normal limits. Bone marrow signal intensity normal. No scalp soft tissue abnormality. Sinuses/Orbits: Globes orbital soft tissues within normal limits. Scattered mucosal thickening present about the sphenoid ethmoidal sinuses. Paranasal sinuses are otherwise clear. No significant mastoid effusion. Other: None. IMPRESSION: 1. No acute intracranial abnormality. 2. Mild chronic microvascular ischemic disease for age. Electronically Signed   By: Rise Mu M.D.    On: 05/07/2023 03:02   CT ANGIO HEAD NECK W WO CM (CODE STROKE)  Result Date: 05/07/2023 CLINICAL DATA:  Initial evaluation for neuro deficit, stroke. EXAM: CT ANGIOGRAPHY HEAD AND NECK WITH AND WITHOUT CONTRAST TECHNIQUE: Multidetector CT imaging of the head and neck was performed using the standard protocol during bolus administration of intravenous contrast. Multiplanar CT image reconstructions and MIPs were obtained to evaluate the vascular anatomy. Carotid stenosis measurements (when applicable) are obtained utilizing NASCET criteria, using the distal internal carotid diameter as the denominator. RADIATION DOSE REDUCTION: This exam was performed according to the departmental dose-optimization program which includes automated exposure control, adjustment of the mA and/or kV according to patient size and/or use of iterative reconstruction technique. CONTRAST:  75mL OMNIPAQUE IOHEXOL 350 MG/ML SOLN COMPARISON:  Prior CT from earlier the same day as well as prior exam from 05/25/2022. FINDINGS: CTA NECK FINDINGS Aortic arch: Visualized aortic arch within normal limits for caliber. Bovine branching pattern noted. No stenosis about the origin of the great vessels. Right carotid system: Right common and internal carotid arteries are patent without dissection. Mild atheromatous change about the right carotid bulb without hemodynamically significant stenosis. Left carotid system: Left common and internal carotid arteries are patent without dissection. Mild atheromatous change about the left carotid bulb without hemodynamically significant stenosis. Vertebral arteries: Both vertebral arteries arise from subclavian arteries. No proximal subclavian artery stenosis. Vertebral arteries are patent without stenosis or dissection. Skeleton: No discrete or worrisome osseous lesions. Moderate spondylosis, most pronounced at C5-6 and C6-7. Other neck: No other acute finding. Upper chest: No other acute finding. Review of the  MIP images confirms the above findings CTA HEAD FINDINGS Anterior circulation: Mild atheromatous change about the carotid siphons without stenosis. A1 segments, anterior communicating artery complex. Anterior cerebral arteries patent without stenosis. No M1 stenosis or occlusion. No proximal MCA branch occlusion or high-grade stenosis. Distal to the branches perfused and symmetric. Posterior circulation: Both V4 segments patent without stenosis. Left vertebral artery dominant. Left PICA patent at its origin. Right PICA not well seen. Basilar patent without stenosis. Superior cerebellar and posterior cerebral arteries patent bilaterally. Venous sinuses: Patent allowing for timing the contrast bolus. Anatomic variants: None significant.  No aneurysm. Review of the MIP images confirms the above findings IMPRESSION: 1. Negative CTA for large vessel occlusion or other emergent finding. 2. Mild atheromatous change about the carotid bifurcations and carotid siphons without hemodynamically significant or correctable stenosis. These results were communicated to Dr. Derry Lory at 12:58 am on 05/07/2023 by text page via the [facility] messaging system. Electronically Signed  By: Rise Mu M.D.   On: 05/07/2023 01:01   DG Chest Portable 1 View  Result Date: 05/07/2023 CLINICAL DATA:  Stroke.  Aphasia and chest pain. EXAM: PORTABLE CHEST 1 VIEW COMPARISON:  02/06/2023 FINDINGS: Cardiac enlargement. No vascular congestion, edema, or consolidation. No pleural effusions. No pneumothorax. Mediastinal contours appear intact. Calcification of the aorta. IMPRESSION: Cardiac enlargement.  No evidence of active pulmonary disease. Electronically Signed   By: Burman Nieves M.D.   On: 05/07/2023 00:58   CT HEAD CODE STROKE WO CONTRAST  Result Date: 05/07/2023 CLINICAL DATA:  Code stroke. EXAM: CT HEAD WITHOUT CONTRAST TECHNIQUE: Contiguous axial images were obtained from the base of the skull through the vertex without  intravenous contrast. RADIATION DOSE REDUCTION: This exam was performed according to the departmental dose-optimization program which includes automated exposure control, adjustment of the mA and/or kV according to patient size and/or use of iterative reconstruction technique. COMPARISON:  Prior study from 05/25/2022 FINDINGS: Brain: Cerebral volume within normal limits. No acute intracranial hemorrhage. No acute large vessel territory infarct. No mass lesion or midline shift. No hydrocephalus or extra-axial fluid collection. Vascular: No abnormal hyperdense vessel. Scattered vascular calcifications noted within the carotid siphons. Skull: Scalp soft tissues and calvarium within normal limits. Sinuses/Orbits: Globes and orbital soft tissues within normal limits. Scattered mucosal thickening present about the sphenoethmoidal sinuses. No significant mastoid effusion. Other: None. ASPECTS [facility] Stroke Program Early CT Score) - Ganglionic level infarction (caudate, lentiform nuclei, internal capsule, insula, M1-M3 cortex): 7 - Supraganglionic infarction (M4-M6 cortex): 3 Total score (0-10 with 10 being normal): 10 IMPRESSION: 1. No acute intracranial abnormality. 2. ASPECTS is 10. These results were communicated to Dr. Derry Lory at 12:26 am on 05/07/2023 by text page via the [facility] messaging system. Electronically Signed   By: Rise Mu M.D.   On: 05/07/2023 00:27       LOS: 0 days   Rola Lennon Rito Ehrlich  [facility] Pager on [email or web address]  05/08/2023, 10:29 AM

## 2023-05-09 DIAGNOSIS — E876 Hypokalemia: Secondary | ICD-10-CM | POA: Diagnosis not present

## 2023-05-09 LAB — MAGNESIUM: Magnesium: 1.9 mg/dL (ref 1.7–2.4)

## 2023-05-09 LAB — BASIC METABOLIC PANEL
Anion gap: 8 (ref 5–15)
BUN: 15 mg/dL (ref 8–23)
CO2: 27 mmol/L (ref 22–32)
Calcium: 8.8 mg/dL — ABNORMAL LOW (ref 8.9–10.3)
Chloride: 101 mmol/L (ref 98–111)
Creatinine, Ser: 0.96 mg/dL (ref 0.61–1.24)
GFR, Estimated: >60 mL/min (ref >60)
Glucose, Bld: 101 mg/dL — ABNORMAL HIGH (ref 70–99)
Potassium: 3.7 mmol/L (ref 3.5–5.1)
Sodium: 136 mmol/L (ref 135–145)

## 2023-05-09 LAB — GLUCOSE, CAPILLARY: Glucose-Capillary: 162 mg/dL — ABNORMAL HIGH (ref 70–99)

## 2023-05-09 MED ORDER — POTASSIUM CHLORIDE CRYS ER 20 MEQ PO TBCR
40.0000 meq | EXTENDED_RELEASE_TABLET | Freq: Once | ORAL | Status: AC
  Administered 2023-05-09: 40 meq via ORAL
  Filled 2023-05-09: qty 2

## 2023-05-09 NOTE — Discharge Summary (Signed)
[facility]  Physician Discharge Summary   Patient ID: Lawrence [name] MRN: [id number] DOB/AGE: [birth year] 67 y.o.  Admit date: 05/07/2023 Discharge date: 05/09/2023    PCP: [name], PA-C  DISCHARGE DIAGNOSES:    Hypokalemia Acute kidney injury   Chronic diastolic CHF (congestive heart failure) (HCC)   OSA (obstructive sleep apnea)   Type 2 diabetes mellitus with hyperlipidemia (HCC)   Essential hypertension   Seizure disorder (HCC)   AF (paroxysmal atrial fibrillation) (HCC)   COPD (chronic obstructive pulmonary disease) (HCC)   Hemiparesis affecting left side as late effect of cerebrovascular accident (CVA) (HCC)   S/P TAVR (transcatheter aortic valve replacement)   RECOMMENDATIONS FOR OUTPATIENT FOLLOW UP: Outpatient follow-up with PC   Home Health: PT OT Equipment/Devices: None  CODE STATUS: DNR  DISCHARGE CONDITION: fair  Diet recommendation: As before  INITIAL HISTORY: 67 y.o. male with significant past medical history including atrial fibrillation on anticoagulation, history of severe aortic stenosis status post TAVR recently, prior stroke with residual left-sided weakness, diabetes mellitus type 2 with neuropathy, history of seizure activity on antiepileptics, coronary artery disease, COPD, obstructive sleep apnea who was brought into the hospital by EMS after family called EMS due to concern for stroke.  Patient was noted to be aphasic.  Patient was brought into the ED.  A code stroke was called.  Patient was evaluated by neurology.  Underwent CT head, CT angiogram head and neck followed by MRI which was negative for stroke.  Blood work showed that patient was dehydrated with acute kidney injury and had severe hypokalemia.  He was hospitalized for further management.    HOSPITAL COURSE:   Acute kidney injury/hypokalemia Came in with a creatinine of 1.7 and a potassium of 2.5.  Baseline renal function is normal. Patient was hydrated.  Was given  potassium supplements.  Diuretics were placed on hold.  Renal function improved.  Back to baseline.  Renal ultrasound does not show any hydronephrosis.  Potassium level has improved as well.    Transient aphasia in the setting of previous history of stroke Symptoms thought to be secondary to dehydration and electrolyte abnormalities.  Neurological workup has been unremarkable.  MRI did not show any new stroke. Continue with apixaban and statin.  No further neurological workup at this time.   Chronic diastolic CHF/history of aortic stenosis He is status post TAVR recently in July Experienced ventricular bigeminy without any symptoms.   Troponins have been normal.  No chest pain currently. Okay to resume all home medications including diuretics.   Diabetes mellitus type 2 with hyperlipidemia   Paroxysmal atrial fibrillation Continue with metoprolol and apixaban.   Seizure disorder Followed by Dr. Olegario Messier with neurology.  Continue Keppra and Vimpat.  Continue Lyrica.   Obstructive sleep apnea CPAP   History of anxiety and depression Continue with the psychotropic medications.   History of COPD Stable.   Morbid obesity Estimated body mass index is 43.87 kg/m as calculated from the following:   Height as of this encounter: 6\' 2"  (1.88 m).   Weight as of this encounter: 155 kg.   Patient is stable.  Randel Pigg on going home today.  Okay for discharge.  PERTINENT LABS:  The results of significant diagnostics from this hospitalization (including imaging, microbiology, ancillary and laboratory) are listed below for reference.    Labs:   Basic Metabolic Panel: Recent Labs  Lab 05/07/23 0051 05/07/23 1426 05/08/23 0219 05/08/23 1502 05/09/23 0636  NA 135 139 134* 134* 136  K 2.5* 2.9* 2.9* 3.3* 3.7  CL 96* 93* 95* 98 101  CO2 23 28 26 25 27   GLUCOSE 127* 146* 130* 138* 101*  BUN 44* 40* 33* 22 15  CREATININE 1.54* 1.29* 1.34* 0.92 0.96  CALCIUM 8.4* 9.1 8.8* 8.9 8.8*  MG  --    --  1.7  --  1.9   Liver Function Tests: Recent Labs  Lab 05/07/23 0051 05/08/23 0219  AST 24 18  ALT 17 17  ALKPHOS 79 61  BILITOT 0.9 0.4  PROT 6.1* 5.9*  ALBUMIN 3.2* 3.0*    CBC: Recent Labs  Lab 05/07/23 0030 05/07/23 0051 05/08/23 0213  WBC  --  14.9* 12.5*  NEUTROABS  --  10.2*  --   HGB 16.0 14.4 13.1  HCT 47.0 44.3 39.4  MCV  --  85.9 84.9  PLT  --  303 263    CBG: Recent Labs  Lab 05/08/23 1143 05/08/23 1213 05/08/23 1604 05/08/23 2005 05/09/23 0808  GLUCAP 153* 148* 140* 137* 162*     IMAGING STUDIES US RENAL  Result Date: 05/08/2023 CLINICAL DATA:  Acute kidney injury EXAM: RENAL / URINARY TRACT ULTRASOUND COMPLETE COMPARISON:  08/13/2020.  CT 01/27/2023 FINDINGS: Right Kidney: Renal measurements: 14.8 x 5.7 x 7.3 cm = volume: 320 mL. Normal echotexture. No suspicious mass or hydronephrosis. Renal cysts are noted, the largest 2.6 cm. Left Kidney: Renal measurements: 14.8 x 7.1 x 6.4 cm = volume: 354 mL. 1.4 cm midpole cyst. Normal echotexture. No hydronephrosis. Bladder: Appears normal for degree of bladder distention. Other: None. IMPRESSION: No acute findings.  No hydronephrosis. Electronically Signed   By: Charlett Nose M.D.   On: 05/08/2023 23:35   MR BRAIN WO CONTRAST  Result Date: 05/07/2023 CLINICAL DATA:  Initial evaluation for neuro deficit, stroke suspected. EXAM: MRI HEAD WITHOUT CONTRAST TECHNIQUE: Multiplanar, multiecho pulse sequences of the brain and surrounding structures were obtained without intravenous contrast. COMPARISON:  Prior CTs from earlier the same day. FINDINGS: Brain: Cerebral volume within normal limits. Mild patchy T2/FLAIR hyperintensity involving the periventricular white matter, most characteristic of chronic microvascular ischemic disease, mild for age. No evidence for acute or subacute ischemia. Gray-white matter differentiation maintained. No areas of chronic cortical infarction. No acute or chronic intracranial blood  products. No mass lesion, midline shift or mass effect. No hydrocephalus or extra-axial fluid collection. Pituitary gland and suprasellar region within normal limits. Vascular: Major intracranial vascular flow voids are maintained. Skull and upper cervical spine: Craniocervical junction within normal limits. Bone marrow signal intensity normal. No scalp soft tissue abnormality. Sinuses/Orbits: Globes orbital soft tissues within normal limits. Scattered mucosal thickening present about the sphenoid ethmoidal sinuses. Paranasal sinuses are otherwise clear. No significant mastoid effusion. Other: None. IMPRESSION: 1. No acute intracranial abnormality. 2. Mild chronic microvascular ischemic disease for age. Electronically Signed   By: Rise Mu M.D.   On: 05/07/2023 03:02   CT ANGIO HEAD NECK W WO CM (CODE STROKE)  Result Date: 05/07/2023 CLINICAL DATA:  Initial evaluation for neuro deficit, stroke. EXAM: CT ANGIOGRAPHY HEAD AND NECK WITH AND WITHOUT CONTRAST TECHNIQUE: Multidetector CT imaging of the head and neck was performed using the standard protocol during bolus administration of intravenous contrast. Multiplanar CT image reconstructions and MIPs were obtained to evaluate the vascular anatomy. Carotid stenosis measurements (when applicable) are obtained utilizing NASCET criteria, using the distal internal carotid diameter as the denominator. RADIATION DOSE REDUCTION: This exam was performed according to the departmental dose-optimization program which  includes automated exposure control, adjustment of the mA and/or kV according to patient size and/or use of iterative reconstruction technique. CONTRAST:  75mL OMNIPAQUE IOHEXOL 350 MG/ML SOLN COMPARISON:  Prior CT from earlier the same day as well as prior exam from 05/25/2022. FINDINGS: CTA NECK FINDINGS Aortic arch: Visualized aortic arch within normal limits for caliber. Bovine branching pattern noted. No stenosis about the origin of the great  vessels. Right carotid system: Right common and internal carotid arteries are patent without dissection. Mild atheromatous change about the right carotid bulb without hemodynamically significant stenosis. Left carotid system: Left common and internal carotid arteries are patent without dissection. Mild atheromatous change about the left carotid bulb without hemodynamically significant stenosis. Vertebral arteries: Both vertebral arteries arise from subclavian arteries. No proximal subclavian artery stenosis. Vertebral arteries are patent without stenosis or dissection. Skeleton: No discrete or worrisome osseous lesions. Moderate spondylosis, most pronounced at C5-6 and C6-7. Other neck: No other acute finding. Upper chest: No other acute finding. Review of the MIP images confirms the above findings CTA HEAD FINDINGS Anterior circulation: Mild atheromatous change about the carotid siphons without stenosis. A1 segments, anterior communicating artery complex. Anterior cerebral arteries patent without stenosis. No M1 stenosis or occlusion. No proximal MCA branch occlusion or high-grade stenosis. Distal to the branches perfused and symmetric. Posterior circulation: Both V4 segments patent without stenosis. Left vertebral artery dominant. Left PICA patent at its origin. Right PICA not well seen. Basilar patent without stenosis. Superior cerebellar and posterior cerebral arteries patent bilaterally. Venous sinuses: Patent allowing for timing the contrast bolus. Anatomic variants: None significant.  No aneurysm. Review of the MIP images confirms the above findings IMPRESSION: 1. Negative CTA for large vessel occlusion or other emergent finding. 2. Mild atheromatous change about the carotid bifurcations and carotid siphons without hemodynamically significant or correctable stenosis. These results were communicated to Dr. Derry Lory at 12:58 am on 05/07/2023 by text page via the [facility] messaging system. Electronically Signed    By: Rise Mu M.D.   On: 05/07/2023 01:01   DG Chest Portable 1 View  Result Date: 05/07/2023 CLINICAL DATA:  Stroke.  Aphasia and chest pain. EXAM: PORTABLE CHEST 1 VIEW COMPARISON:  02/06/2023 FINDINGS: Cardiac enlargement. No vascular congestion, edema, or consolidation. No pleural effusions. No pneumothorax. Mediastinal contours appear intact. Calcification of the aorta. IMPRESSION: Cardiac enlargement.  No evidence of active pulmonary disease. Electronically Signed   By: Burman Nieves M.D.   On: 05/07/2023 00:58   CT HEAD CODE STROKE WO CONTRAST  Result Date: 05/07/2023 CLINICAL DATA:  Code stroke. EXAM: CT HEAD WITHOUT CONTRAST TECHNIQUE: Contiguous axial images were obtained from the base of the skull through the vertex without intravenous contrast. RADIATION DOSE REDUCTION: This exam was performed according to the departmental dose-optimization program which includes automated exposure control, adjustment of the mA and/or kV according to patient size and/or use of iterative reconstruction technique. COMPARISON:  Prior study from 05/25/2022 FINDINGS: Brain: Cerebral volume within normal limits. No acute intracranial hemorrhage. No acute large vessel territory infarct. No mass lesion or midline shift. No hydrocephalus or extra-axial fluid collection. Vascular: No abnormal hyperdense vessel. Scattered vascular calcifications noted within the carotid siphons. Skull: Scalp soft tissues and calvarium within normal limits. Sinuses/Orbits: Globes and orbital soft tissues within normal limits. Scattered mucosal thickening present about the sphenoethmoidal sinuses. No significant mastoid effusion. Other: None. ASPECTS [facility] Stroke Program Early CT Score) - Ganglionic level infarction (caudate, lentiform nuclei, internal capsule, insula, M1-M3 cortex): 7 - Supraganglionic  infarction (M4-M6 cortex): 3 Total score (0-10 with 10 being normal): 10 IMPRESSION: 1. No acute intracranial abnormality.  2. ASPECTS is 10. These results were communicated to Dr. Derry Lory at 12:26 am on 05/07/2023 by text page via the [facility] messaging system. Electronically Signed   By: Rise Mu M.D.   On: 05/07/2023 00:27    DISCHARGE EXAMINATION: Vitals:   05/08/23 2003 05/09/23 0348 05/09/23 0731 05/09/23 0806  BP: 111/61 119/60  117/66  Pulse: (!) 43 71  (!) 40  Resp:    19  Temp: 98 F (36.7 C)   97.7 F (36.5 C)  TempSrc: Oral     SpO2: 91% 91% 95% 91%  Weight:      Height:       General appearance: Awake alert.  In no distress Resp: Clear to auscultation bilaterally.  Normal effort Cardio: S1-S2 is normal regular.  No S3-S4.  No rubs murmurs or bruit GI: Abdomen is soft.  Nontender nondistended.  Bowel sounds are present normal.  No masses organomegaly    DISPOSITION: Home  Discharge Instructions     Call MD for:  difficulty breathing, headache or visual disturbances   Complete by: As directed    Call MD for:  extreme fatigue   Complete by: As directed    Call MD for:  persistant dizziness or light-headedness   Complete by: As directed    Call MD for:  persistant nausea and vomiting   Complete by: As directed    Call MD for:  severe uncontrolled pain   Complete by: As directed    Call MD for:  temperature >100.4   Complete by: As directed    Diet - low sodium heart healthy   Complete by: As directed    Diet Carb Modified   Complete by: As directed    Discharge instructions   Complete by: As directed    Please take your medications as prescribed.  Resume your diuretics including the torsemide and the spironolactone from 05/10/2023.  Follow-up with your primary care provider in 1 week.  At that time repeat blood work will need to be done to check your potassium level as well as your kidney function.  You were cared for by a hospitalist during your hospital stay. If you have any questions about your discharge medications or the care you received while you were in the  hospital after you are discharged, you can call the unit and asked to speak with the hospitalist on call if the hospitalist that took care of you is not available. Once you are discharged, your primary care physician will handle any further medical issues. Please note that NO REFILLS for any discharge medications will be authorized once you are discharged, as it is imperative that you return to your primary care physician (or establish a relationship with a primary care physician if you do not have one) for your aftercare needs so that they can reassess your need for medications and monitor your lab values. If you do not have a primary care physician, you can call [phone] for a physician referral.   Increase activity slowly   Complete by: As directed          Allergies as of 05/09/2023       Reactions   Vancomycin Other (See Comments)   "Red Man Syndrome" 02/02/17: possible cause for rash under both arms   Niacin And Related Other (See Comments)   Red man syndrome   Tubersol [tuberculin, Ppd]  Other (See Comments)   Reaction unknown   Doxycycline Rash, Other (See Comments)        Medication List     TAKE these medications    acetaminophen 650 MG CR tablet Commonly known as: TYLENOL Take 1,300 mg by mouth every 8 (eight) hours as needed for pain.   albuterol 108 (90 Base) MCG/ACT inhaler Commonly known as: VENTOLIN HFA Inhale 2 puffs into the lungs every 6 (six) hours as needed for wheezing or shortness of breath.   Alcohol Prep 70 % Pads 6 Pads by Does not apply route daily.   allopurinol 100 MG tablet Commonly known as: ZYLOPRIM Take 200 mg by mouth 2 (two) times daily.   amoxicillin 500 MG tablet Commonly known as: AMOXIL Take 4 tablets (2,000 mg total) by mouth as directed. 1 hour prior to dental work including cleanings   apixaban 5 MG Tabs tablet Commonly known as: Eliquis Take 1 tablet (5 mg total) by mouth 2 (two) times daily.   ARIPiprazole 5 MG  tablet Commonly known as: ABILIFY TAKE ONE TABLET BY MOUTH EVERYDAY AT BEDTIME What changed:  how much to take how to take this when to take this additional instructions   atorvastatin 40 MG tablet Commonly known as: LIPITOR Take 1 tablet (40 mg total) by mouth daily.   B-D ULTRAFINE III SHORT PEN 31G X 8 MM Misc Generic drug: Insulin Pen Needle Inject 1 each into the skin 3 (three) times daily before meals. Use as instructed 4X daily.   busPIRone 10 MG tablet Commonly known as: BUSPAR Take 1 tablet (10 mg total) by mouth 3 (three) times daily.   cetirizine 10 MG tablet Commonly known as: ZYRTEC Take 10 mg by mouth at bedtime.   cyanocobalamin 1000 MCG tablet Commonly known as: VITAMIN B12 Take 1 tablet (1,000 mcg total) by mouth daily.   dapagliflozin propanediol 10 MG Tabs tablet Commonly known as: Farxiga TAKE ONE TABLET BY MOUTH BEFORE BREAKFAST What changed:  how much to take how to take this when to take this additional instructions   diclofenac Sodium 1 % Gel Commonly known as: VOLTAREN Apply 4 g topically 4 (four) times daily. To right knee.   fenofibrate 145 MG tablet Commonly known as: Tricor Take 1 tablet (145 mg total) by mouth daily.   ferrous sulfate 324 (65 Fe) MG Tbec Take 324 mg by mouth daily with breakfast.   fluticasone 50 MCG/ACT nasal spray Commonly known as: FLONASE Place 2 sprays into both nostrils daily.   icosapent Ethyl 1 g capsule Commonly known as: Vascepa Take 2 capsules (2 g total) by mouth 2 (two) times daily.   Lacosamide 150 MG Tabs Take 1 tablet (150 mg total) by mouth 2 (two) times daily.   levETIRAcetam 500 MG tablet Commonly known as: KEPPRA Take 1 tablet (500 mg total) by mouth 2 (two) times daily.   metFORMIN 500 MG 24 hr tablet Commonly known as: GLUCOPHAGE-XR Take 4 tablets (2,000 mg total) by mouth daily.   metoprolol succinate 50 MG 24 hr tablet Commonly known as: TOPROL-XL Take 1 tablet (50 mg total) by  mouth daily.   mirtazapine 15 MG tablet Commonly known as: REMERON Take 1 tablet (15 mg total) by mouth at bedtime.   NovoLOG FlexPen 100 UNIT/ML FlexPen Generic drug: insulin aspart INJECT 12 UNITS BEFORE MEALS, keep sugar TWO HOURS AFTER meals UNDER 180 AT least   omeprazole 20 MG capsule Commonly known as: PRILOSEC Take 20 mg by mouth every  morning.   potassium chloride SA 20 MEQ tablet Commonly known as: KLOR-CON M Take 2 tablets (40 mEq total) by mouth 2 (two) times daily.   prazosin 2 MG capsule Commonly known as: MINIPRESS Take 1 capsule (2 mg total) by mouth at bedtime.   pregabalin 150 MG capsule Commonly known as: LYRICA Take 1 capsule (150 mg total) by mouth 3 (three) times daily.   repaglinide 2 MG tablet Commonly known as: PRANDIN TAKE TWO TABLETS BY MOUTH TWICE DAILY BEFORE A meal What changed: See the new instructions.   spironolactone 25 MG tablet Commonly known as: ALDACTONE Take 0.5 tablets (12.5 mg total) by mouth daily.   tirzepatide 15 MG/0.5ML Pen Commonly known as: MOUNJARO Inject 15 mg into the skin once a week. What changed: when to take this   torsemide 20 MG tablet Commonly known as: DEMADEX Take 6 tablets (120 mg total) by mouth in the morning and at bedtime.   traZODone 100 MG tablet Commonly known as: DESYREL Take 2 tablets (200 mg total) by mouth at bedtime.   Trelegy Ellipta 200-62.5-25 MCG/ACT Aepb Generic drug: Fluticasone-Umeclidin-Vilant INHALE 1 PUFF BY MOUTH INTO LUNGS ONCE DAILY   Tresiba FlexTouch 100 UNIT/ML FlexTouch Pen Generic drug: insulin degludec INJECT 40 UNITS into THE SKIN ONCE DAILY What changed: See the new instructions.   venlafaxine XR 75 MG 24 hr capsule Commonly known as: EFFEXOR-XR Take 3 capsules (225 mg total) by mouth daily with breakfast.   VITAMIN D3 PO Take 2,000 Units by mouth daily.          Follow-up Information     [facility] Follow up.   Specialty: Home Health  Services Why: [facility] will provide home health services.  They will call you in the next 24-48 hours to start services. Contact information: [address] [place] [address] [phone]         [name], PA-C. Schedule an appointment as soon as possible for a visit in 1 week(s).   Specialty: Family Medicine Why: post hospitalization follow up Contact information: [address] [place] [address] [phone]                 TOTAL DISCHARGE TIME: 35 minutes  Bellatrix Devonshire Rito Ehrlich  [facility] Pager on [email or web address]  05/09/2023, 11:49 AM

## 2023-05-09 NOTE — Progress Notes (Signed)
Physical Therapy Treatment Patient Details Name: Lawrence [name] MRN: [id number] DOB: August 23, 1955 Today's Date: 05/09/2023   History of Present Illness Pt is 67 yo male presented on 05/07/23  with concern for stroke due to aphasia.  MRI was negative for stroke.  Pt was found to be dehydrated with AKI and severe hypokalemia.  Pt has been receiving potassium today.  Pt with hx including but not limited to  atrial fibrillation on anticoagulation, history of severe aortic stenosis status post TAVR recently, prior stroke with residual left-sided weakness, diabetes mellitus type 2 with neuropathy, history of seizure activity on antiepileptics, coronary artery disease, COPD, obstructive sleep apnea    PT Comments  Pt greeted resting in bed and agreeable to session with continued progress towards acute goals. Pt continues to be limited by decreased activity tolerance, R knee pain with severe genu varus, and impaired balance/postural reactions. Pt able to complete bed mobility, transfers and gait with RW for support with grossly CGA for safety with pt able to increased gait distance significantly this session with x1 standing rest needed due to fatigue. Pt was educated on continued DME use to maximize functional independence, safety, and decrease risk for falls as well as appropriate activity progression with pt verbalizing understanding. Pt continues to benefit from skilled PT services to progress toward functional mobility goals.     If plan is discharge home, recommend the following: A little help with walking and/or transfers;A little help with bathing/dressing/bathroom;Assistance with cooking/housework;Help with stairs or ramp for entrance   Can travel by private vehicle        Equipment Recommendations  None recommended by PT    Recommendations for Other Services       Precautions / Restrictions Precautions Precautions: Fall Restrictions Weight Bearing Restrictions: No     Mobility  Bed  Mobility Overal bed mobility: Needs Assistance Bed Mobility: Supine to Sit     Supine to sit: Supervision     General bed mobility comments: increased time    Transfers Overall transfer level: Needs assistance Equipment used: Rolling walker (2 wheels) Transfers: Sit to/from Stand Sit to Stand: From elevated surface, Contact guard assist           General transfer comment: cues for hand placement    Ambulation/Gait Ambulation/Gait assistance: Contact guard assist Gait Distance (Feet): 90 Feet Assistive device: Rolling walker (2 wheels) Gait Pattern/deviations: Step-to pattern, Decreased stride length Gait velocity: decreased     General Gait Details: Pt with severe genu varus on R that increased with weight bearing - reports baseline, no pain, does not regularly wear brace.  No LOB but did fatigue easily needing standing rest break   Stairs             Wheelchair Mobility     Tilt Bed    Modified Rankin (Stroke Patients Only)       Balance Overall balance assessment: Needs assistance Sitting-balance support: No upper extremity supported Sitting balance-Leahy Scale: Good     Standing balance support: Bilateral upper extremity supported, Reliant on assistive device for balance Standing balance-Leahy Scale: Poor Standing balance comment: steady with RW                            Cognition Arousal: Alert Behavior During Therapy: WFL for tasks assessed/performed Overall Cognitive Status: Within Functional Limits for tasks assessed  General Comments: cog WFL for basic orientation and command following        Exercises      General Comments General comments (skin integrity, edema, etc.): VSS on RA      Pertinent Vitals/Pain Pain Assessment Pain Assessment: Faces Faces Pain Scale: Hurts a little bit Pain Location: bil feet and knee with weight bearing Pain Descriptors / Indicators:  Sore Pain Intervention(s): Monitored during session, Limited activity within patient's tolerance    Home Living                          Prior Function            PT Goals (current goals can now be found in the care plan section) Acute Rehab PT Goals Patient Stated Goal: return home PT Goal Formulation: With patient Time For Goal Achievement: 05/21/23 Progress towards PT goals: Progressing toward goals    Frequency    Min 1X/week      PT Plan      Co-evaluation              AM-PAC PT "6 Clicks" Mobility   Outcome Measure  Help needed turning from your back to your side while in a flat bed without using bedrails?: A Little Help needed moving from lying on your back to sitting on the side of a flat bed without using bedrails?: A Little Help needed moving to and from a bed to a chair (including a wheelchair)?: A Little Help needed standing up from a chair using your arms (e.g., wheelchair or bedside chair)?: A Little Help needed to walk in hospital room?: A Little Help needed climbing 3-5 steps with a railing? : A Lot 6 Click Score: 17    End of Session   Activity Tolerance: Patient tolerated treatment well Patient left: with call bell/phone within reach;in bed (seated up EOB) Nurse Communication: Mobility status PT Visit Diagnosis: Other abnormalities of gait and mobility (R26.89);Muscle weakness (generalized) (M62.81)     Time: 1022-1040 PT Time Calculation (min) (ACUTE ONLY): 18 min  Charges:    $Gait Training: 8-22 mins PT General Charges $$ ACUTE PT VISIT: 1 Visit                     Quamir Willemsen R. PTA Acute Rehabilitation Services Office: [phone]   Catalina Antigua 05/09/2023, 12:16 PM

## 2023-05-10 ENCOUNTER — Other Ambulatory Visit: Payer: Self-pay | Admitting: "Endocrinology

## 2023-05-10 DIAGNOSIS — E1165 Type 2 diabetes mellitus with hyperglycemia: Secondary | ICD-10-CM

## 2023-05-14 ENCOUNTER — Other Ambulatory Visit: Payer: Self-pay

## 2023-05-14 DIAGNOSIS — F4310 Post-traumatic stress disorder, unspecified: Secondary | ICD-10-CM

## 2023-05-14 MED ORDER — PRAZOSIN HCL 2 MG PO CAPS: 4.0000 mg | ORAL_CAPSULE | Freq: Every day | ORAL | 2 refills | Status: DC

## 2023-05-15 ENCOUNTER — Emergency Department (HOSPITAL_COMMUNITY): Payer: No Typology Code available for payment source

## 2023-05-15 ENCOUNTER — Telehealth: Payer: Self-pay | Admitting: Neurology

## 2023-05-15 ENCOUNTER — Encounter (HOSPITAL_COMMUNITY): Payer: Self-pay | Admitting: *Deleted

## 2023-05-15 ENCOUNTER — Other Ambulatory Visit: Payer: Self-pay

## 2023-05-15 ENCOUNTER — Emergency Department (HOSPITAL_COMMUNITY)
Admission: EM | Admit: 2023-05-15 | Discharge: 2023-05-15 | Disposition: A | Discharge status: Home / Self Care | Payer: No Typology Code available for payment source | Attending: Emergency Medicine | Admitting: Emergency Medicine

## 2023-05-15 DIAGNOSIS — Z794 Long term (current) use of insulin: Secondary | ICD-10-CM | POA: Insufficient documentation

## 2023-05-15 DIAGNOSIS — Z7984 Long term (current) use of oral hypoglycemic drugs: Secondary | ICD-10-CM | POA: Insufficient documentation

## 2023-05-15 DIAGNOSIS — G459 Transient cerebral ischemic attack, unspecified: Secondary | ICD-10-CM | POA: Diagnosis not present

## 2023-05-15 DIAGNOSIS — J45909 Unspecified asthma, uncomplicated: Secondary | ICD-10-CM | POA: Insufficient documentation

## 2023-05-15 DIAGNOSIS — J449 Chronic obstructive pulmonary disease, unspecified: Secondary | ICD-10-CM | POA: Insufficient documentation

## 2023-05-15 DIAGNOSIS — I959 Hypotension, unspecified: Secondary | ICD-10-CM | POA: Insufficient documentation

## 2023-05-15 DIAGNOSIS — I5032 Chronic diastolic (congestive) heart failure: Secondary | ICD-10-CM | POA: Insufficient documentation

## 2023-05-15 DIAGNOSIS — R531 Weakness: Secondary | ICD-10-CM

## 2023-05-15 DIAGNOSIS — R4781 Slurred speech: Secondary | ICD-10-CM | POA: Insufficient documentation

## 2023-05-15 DIAGNOSIS — R918 Other nonspecific abnormal finding of lung field: Secondary | ICD-10-CM | POA: Diagnosis not present

## 2023-05-15 DIAGNOSIS — Z7901 Long term (current) use of anticoagulants: Secondary | ICD-10-CM | POA: Insufficient documentation

## 2023-05-15 DIAGNOSIS — E119 Type 2 diabetes mellitus without complications: Secondary | ICD-10-CM | POA: Diagnosis not present

## 2023-05-15 DIAGNOSIS — Z79899 Other long term (current) drug therapy: Secondary | ICD-10-CM | POA: Insufficient documentation

## 2023-05-15 DIAGNOSIS — R001 Bradycardia, unspecified: Secondary | ICD-10-CM | POA: Diagnosis not present

## 2023-05-15 DIAGNOSIS — Z7401 Bed confinement status: Secondary | ICD-10-CM | POA: Diagnosis not present

## 2023-05-15 DIAGNOSIS — I110 Hypertensive heart disease with heart failure: Secondary | ICD-10-CM | POA: Diagnosis not present

## 2023-05-15 DIAGNOSIS — R4182 Altered mental status, unspecified: Secondary | ICD-10-CM | POA: Diagnosis not present

## 2023-05-15 DIAGNOSIS — G8190 Hemiplegia, unspecified affecting unspecified side: Secondary | ICD-10-CM | POA: Diagnosis not present

## 2023-05-15 DIAGNOSIS — I10 Essential (primary) hypertension: Secondary | ICD-10-CM | POA: Diagnosis not present

## 2023-05-15 DIAGNOSIS — E876 Hypokalemia: Secondary | ICD-10-CM | POA: Diagnosis not present

## 2023-05-15 LAB — COMPREHENSIVE METABOLIC PANEL
ALT: 21 U/L (ref 0–44)
AST: 19 U/L (ref 15–41)
Albumin: 3.4 g/dL — ABNORMAL LOW (ref 3.5–5.0)
Alkaline Phosphatase: 67 U/L (ref 38–126)
Anion gap: 16 — ABNORMAL HIGH (ref 5–15)
BUN: 19 mg/dL (ref 8–23)
CO2: 28 mmol/L (ref 22–32)
Calcium: 9.2 mg/dL (ref 8.9–10.3)
Chloride: 96 mmol/L — ABNORMAL LOW (ref 98–111)
Creatinine, Ser: 1 mg/dL (ref 0.61–1.24)
GFR, Estimated: >60 mL/min (ref >60)
Glucose, Bld: 121 mg/dL — ABNORMAL HIGH (ref 70–99)
Potassium: 3.4 mmol/L — ABNORMAL LOW (ref 3.5–5.1)
Sodium: 140 mmol/L (ref 135–145)
Total Bilirubin: 0.3 mg/dL (ref 0.3–1.2)
Total Protein: 6.9 g/dL (ref 6.5–8.1)

## 2023-05-15 LAB — URINALYSIS, W/ REFLEX TO CULTURE (INFECTION SUSPECTED)
Bacteria, UA: NONE SEEN
Bilirubin Urine: NEGATIVE
Glucose, UA: >=500 mg/dL — AB
Hgb urine dipstick: NEGATIVE
Ketones, ur: NEGATIVE mg/dL
Leukocytes,Ua: NEGATIVE
Nitrite: NEGATIVE
Protein, ur: NEGATIVE mg/dL
Specific Gravity, Urine: 1.007 (ref 1.005–1.030)
pH: 6 (ref 5.0–8.0)

## 2023-05-15 LAB — BRAIN NATRIURETIC PEPTIDE: B Natriuretic Peptide: 73.1 pg/mL (ref 0.0–100.0)

## 2023-05-15 LAB — CBC WITH DIFFERENTIAL/PLATELET
Abs Immature Granulocytes: 0.09 10*3/uL — ABNORMAL HIGH (ref 0.00–0.07)
Basophils Absolute: 0.1 10*3/uL (ref 0.0–0.1)
Basophils Relative: 0 %
Eosinophils Absolute: 0.3 10*3/uL (ref 0.0–0.5)
Eosinophils Relative: 3 %
HCT: 44.2 % (ref 39.0–52.0)
Hemoglobin: 14.2 g/dL (ref 13.0–17.0)
Immature Granulocytes: 1 %
Lymphocytes Relative: 12 %
Lymphs Abs: 1.6 10*3/uL (ref 0.7–4.0)
MCH: 28.1 pg (ref 26.0–34.0)
MCHC: 32.1 g/dL (ref 30.0–36.0)
MCV: 87.5 fL (ref 80.0–100.0)
Monocytes Absolute: 0.8 10*3/uL (ref 0.1–1.0)
Monocytes Relative: 6 %
Neutro Abs: 10.8 10*3/uL — ABNORMAL HIGH (ref 1.7–7.7)
Neutrophils Relative %: 78 %
Platelets: 244 10*3/uL (ref 150–400)
RBC: 5.05 MIL/uL (ref 4.22–5.81)
RDW: 15.9 % — ABNORMAL HIGH (ref 11.5–15.5)
WBC: 13.7 10*3/uL — ABNORMAL HIGH (ref 4.0–10.5)
nRBC: 0 % (ref 0.0–0.2)

## 2023-05-15 LAB — TROPONIN I (HIGH SENSITIVITY)
Troponin I (High Sensitivity): 9 ng/L (ref <18)
Troponin I (High Sensitivity): 8 ng/L (ref <18)

## 2023-05-15 MED ORDER — PREGABALIN 25 MG PO CAPS
150.0000 mg | ORAL_CAPSULE | Freq: Once | ORAL | Status: AC
  Administered 2023-05-15: 150 mg via ORAL
  Filled 2023-05-15: qty 2

## 2023-05-15 MED ORDER — LEVETIRACETAM 500 MG PO TABS
500.0000 mg | ORAL_TABLET | Freq: Once | ORAL | Status: AC
  Administered 2023-05-15: 500 mg via ORAL
  Filled 2023-05-15: qty 1

## 2023-05-15 MED ORDER — SODIUM CHLORIDE 0.9 % IV BOLUS
500.0000 mL | Freq: Once | INTRAVENOUS | Status: AC
  Administered 2023-05-15: 500 mL via INTRAVENOUS

## 2023-05-15 NOTE — ED Notes (Signed)
Pt in NAD at dc from ED. A&O. Respirations even & unlabored. Skin warm & dry. P discharged with PTAR for transport home. Bedside report given to PTAR.

## 2023-05-15 NOTE — ED Notes (Addendum)
This RN reviewed discharge instructions with patient. He verbalized understanding and denied any further questions. Pt waiting for PTAR in hallway. PTAR contacted and paperwork on charge desk.

## 2023-05-15 NOTE — ED Triage Notes (Signed)
Ems states they were called out for left sided weakness and slurred speech however they found this to be his baseline, c/o not feeling well for several days, was hospitalized several weeks ago and was here for 4 days for hypokalemia  , sx. Today are similar to previous problem. Patient is alert to loud verbal stimuli and oriented. Patient states he is tired however was able to sleep last pm.

## 2023-05-15 NOTE — Telephone Encounter (Signed)
Call to partner [name], She reports patient was taken to ER today via ambulance due to slurred speech and weakness. Mainly on the right side. She does report tremors in hand and arms but again mainly on the right side. She says he just had a hospital admission last week as well and was told that he is dehydrated and has low magnesium and potassium. She is concerned because says something is just not right and they are sending him home this evening.  She reports no seizures but she thinks that the tremors in hands are seizures.  Advised Dr. Teresa Coombs was gone for the day but I would send him a note of what was going on. [name] appreciative of call.

## 2023-05-15 NOTE — Discharge Instructions (Signed)
Your workup was reassuring today.  Your potassium is only slightly below normal.  You have been given some fluids.  Follow-up with your doctor.

## 2023-05-15 NOTE — Telephone Encounter (Signed)
Pt's significant other is not in agreement with the hospital wanting to discharge pt to go home. She fears that when released she is going to have to send him right back.  His speech is still slurred, and to her he has shown no sign of improvement.  Pt's significant other states that she wants Dr Karie Georges take on this.  The significant other states if he is sent home she may just keep him home so that he can have a stroke and then maybe something more will be done.  Phone rep advised the significant other that her remarks would be documented and that the request for a call back would be submitted. The option of her sending a my chart message was presented to her, she said she may try to do so.

## 2023-05-15 NOTE — ED Provider Notes (Signed)
[place] EMERGENCY DEPARTMENT AT [facility] Provider Note   CSN: [id number] Arrival date & time: 05/15/23  1044     History  Chief Complaint  Patient presents with   Fatigue    Lawrence [name] is a 67 y.o. male.  HPI Patient presents with weakness. Reportedly also has slurred speech.  Somewhat slow to answer.  Recent admission for same.  Found to be hypokalemia.  Had extensive workup. Denies fever.  Appears to be moving all extremities.  Reportedly has not been sleeping well   Past Medical History:  Diagnosis Date   Acquired dilation of ascending aorta and aortic root (HCC)    40mm by echo 01/2021   Adenomatous colon polyp 2007   Anemia    Anxiety    Asthma    BPH without obstruction/lower urinary tract symptoms 02/22/2017   Chronic diastolic (congestive) heart failure (HCC)    Chronic venous stasis 03/07/2019   COPD (chronic obstructive pulmonary disease) (HCC)    Coronary artery calcification seen on CAT scan    Depression    Diabetic neuropathy (HCC) 09/11/2019   History of colon polyps 08/24/2018   Hypertension    Morbid obesity (HCC)    OSA (obstructive sleep apnea)    Pain due to onychomycosis of toenails of both feet 09/11/2019   Peripheral neuropathy 02/22/2017   Primary osteoarthritis, left shoulder 03/05/2017   PTSD (post-traumatic stress disorder)    Pure hypercholesterolemia    QT prolongation 03/07/2019   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 Lynnette Caffey and Dr. Laneta Simmers   Seizures [facility])    Severe aortic stenosis    Sinus tachycardia 03/07/2019   Sleep apnea    CPAP   Type 2 diabetes mellitus with vascular disease (HCC) 09/11/2019    Home Medications Prior to Admission medications   Medication Sig Start Date End Date Taking? Authorizing Provider  acetaminophen (TYLENOL) 650 MG CR tablet Take 1,300 mg by mouth every 8 (eight) hours as needed for pain.    [provider]  albuterol (VENTOLIN HFA) 108 (90 Base) MCG/ACT inhaler Inhale 2 puffs into the lungs every 6 (six) hours as needed for wheezing or shortness of breath. 04/17/23   Hunsucker, Lesia Sago, MD  Alcohol Swabs ([employer] PREP) 70 % PADS USE AS DIRECTED 05/11/23   Altamese [place], MD  allopurinol (ZYLOPRIM) 100 MG tablet Take 200 mg by mouth 2 (two) times daily.    [provider]  amoxicillin (AMOXIL) 500 MG tablet Take 4 tablets (2,000 mg total) by mouth as directed. 1 hour prior to dental work including cleanings 02/15/23   Janetta Hora, PA-C  apixaban (ELIQUIS) 5 MG TABS tablet Take 1 tablet (5 mg total) by mouth 2 (two) times daily. 04/17/23   Laurey Morale, MD  ARIPiprazole (ABILIFY) 5 MG tablet TAKE ONE TABLET BY MOUTH EVERYDAY AT BEDTIME Patient taking differently: Take 5 mg by mouth at bedtime. 04/17/23   Mozingo, Thereasa Solo, NP  atorvastatin (LIPITOR) 40 MG tablet Take 1 tablet (40 mg total) by mouth daily. 04/17/23   Laurey Morale, MD  busPIRone (BUSPAR) 10 MG tablet Take 1 tablet (10 mg total) by mouth 3 (three) times daily. 04/17/23   Mozingo, Thereasa Solo, NP  cetirizine (ZYRTEC) 10 MG tablet Take 10 mg by mouth at bedtime.     [provider]  Cholecalciferol (VITAMIN D3 PO) Take 2,000 Units by mouth daily.  [provider]  cyanocobalamin (VITAMIN B12) 1000 MCG tablet Take 1 tablet (1,000 mcg total) by mouth daily. 05/04/23   Windell Norfolk, MD  dapagliflozin propanediol (FARXIGA) 10 MG TABS tablet TAKE ONE TABLET BY MOUTH BEFORE BREAKFAST Patient taking differently: Take 10 mg by mouth daily. 04/17/23   Laurey Morale, MD  diclofenac Sodium (VOLTAREN) 1 % GEL Apply 4 g topically 4 (four) times daily. To right knee. 10/31/22   Arrien, York Ram, MD  fenofibrate (TRICOR) 145 MG tablet Take 1 tablet (145 mg total) by mouth daily. 04/17/23   Laurey Morale, MD  ferrous sulfate 324 (65 Fe) MG TBEC Take 324 mg by mouth daily with breakfast.     [provider]  fluticasone (FLONASE) 50 MCG/ACT nasal spray Place 2 sprays into both nostrils daily.     [provider]  Fluticasone-Umeclidin-Vilant (TRELEGY ELLIPTA) 200-62.5-25 MCG/ACT AEPB INHALE 1 PUFF BY MOUTH INTO LUNGS ONCE DAILY 08/18/22   Hunsucker, Lesia Sago, MD  icosapent Ethyl (VASCEPA) 1 g capsule Take 2 capsules (2 g total) by mouth 2 (two) times daily. 04/17/23   Laurey Morale, MD  insulin aspart (NOVOLOG FLEXPEN) 100 UNIT/ML FlexPen INJECT 12 UNITS BEFORE MEALS, keep sugar TWO HOURS AFTER meals UNDER 180 AT least 04/17/23   Motwani, Komal, MD  Insulin Pen Needle (B-D ULTRAFINE III SHORT PEN) 31G X 8 MM MISC Inject 1 each into the skin 3 (three) times daily before meals. Use as instructed 4X daily. 04/17/23   Altamese [place]apa, MD  Lacosamide 150 MG TABS Take 1 tablet (150 mg total) by mouth 2 (two) times daily. 05/04/23   Windell Norfolk, MD  levETIRAcetam (KEPPRA) 500 MG tablet Take 1 tablet (500 mg total) by mouth 2 (two) times daily. 05/04/23   Windell Norfolk, MD  metFORMIN (GLUCOPHAGE-XR) 500 MG 24 hr tablet Take 4 tablets (2,000 mg total) by mouth daily. 04/17/23   Altamese [place], MD  metoprolol succinate (TOPROL-XL) 50 MG 24 hr tablet Take 1 tablet (50 mg total) by mouth daily. 04/17/23   Laurey Morale, MD  mirtazapine (REMERON) 15 MG tablet Take 1 tablet (15 mg total) by mouth at bedtime. 04/17/23   Mozingo, Thereasa Solo, NP  omeprazole (PRILOSEC) 20 MG capsule Take 20 mg by mouth every morning. 05/29/20   [provider]  potassium chloride SA (KLOR-CON M) 20 MEQ tablet Take 2 tablets (40 mEq total) by mouth 2 (two) times daily. 04/17/23   Laurey Morale, MD  prazosin (MINIPRESS) 2 MG capsule Take 2 capsules (4 mg total) by mouth at bedtime. 05/14/23   Mozingo, Thereasa Solo, NP  pregabalin (LYRICA) 150 MG capsule Take 1 capsule (150 mg total) by mouth 3 (three) times daily. 05/04/23   Windell Norfolk, MD  repaglinide (PRANDIN) 2 MG tablet TAKE TWO TABLETS BY MOUTH  TWICE DAILY BEFORE A meal Patient taking differently: Take 2 mg by mouth 2 (two) times daily before a meal. 10/04/22   Reather Littler, MD  spironolactone (ALDACTONE) 25 MG tablet Take 0.5 tablets (12.5 mg total) by mouth daily. 04/17/23   Laurey Morale, MD  tirzepatide [facility]) 15 MG/0.5ML Pen Inject 15 mg into the skin once a week. Patient taking differently: Inject 15 mg into the skin every Monday. 04/17/23   Altamese Moore, MD  torsemide (DEMADEX) 20 MG tablet Take 6 tablets (120 mg total) by mouth in the morning and at bedtime. 04/17/23   Laurey Morale, MD  traZODone (DESYREL) 100 MG tablet  Take 2 tablets (200 mg total) by mouth at bedtime. 04/17/23   Mozingo, Thereasa Solo, NP  TRESIBA FLEXTOUCH 100 UNIT/ML FlexTouch Pen INJECT 40 UNITS into THE SKIN ONCE DAILY Patient taking differently: Inject 40 Units into the skin daily. 01/27/23   Altamese [place], MD  venlafaxine XR (EFFEXOR-XR) 75 MG 24 hr capsule Take 3 capsules (225 mg total) by mouth daily with breakfast. 04/17/23   Mozingo, Thereasa Solo, NP      Allergies    Vancomycin; Niacin and related; Tubersol [tuberculin, ppd]; and Doxycycline    Review of Systems   Review of Systems  Physical Exam Updated Vital Signs BP 111/64   Pulse 81   Temp 98 F (36.7 C) (Oral)   Resp 15   Ht 6\' 2"  (1.88 m)   Wt (!) 155 kg   SpO2 95%   BMI 43.87 kg/m  Physical Exam Vitals reviewed.  HENT:     Head: Atraumatic.  Cardiovascular:     Rate and Rhythm: Normal rate.  Pulmonary:     Breath sounds: No wheezing.  Abdominal:     Tenderness: There is no abdominal tenderness.  Musculoskeletal:        General: No tenderness.     Right lower leg: Edema present.     Left lower leg: Edema present.  Neurological:     Mental Status: He is alert.     Comments: Awakes and answers questions, however somewhat slow to answer.  Able to tell me what he ate for breakfast this morning, he worries that.     ED Results / Procedures / Treatments    Labs (all labs ordered are listed, but only abnormal results are displayed) Labs Reviewed  CBC WITH DIFFERENTIAL/PLATELET - Abnormal; Notable for the following components:      Result Value   WBC 13.7 (*)    RDW 15.9 (*)    Neutro Abs 10.8 (*)    Abs Immature Granulocytes 0.09 (*)    All other components within normal limits  COMPREHENSIVE METABOLIC PANEL - Abnormal; Notable for the following components:   Potassium 3.4 (*)    Chloride 96 (*)    Glucose, Bld 121 (*)    Albumin 3.4 (*)    Anion gap 16 (*)    All other components within normal limits  URINALYSIS, W/ REFLEX TO CULTURE (INFECTION SUSPECTED) - Abnormal; Notable for the following components:   Color, Urine STRAW (*)    Glucose, UA >=500 (*)    All other components within normal limits  BRAIN NATRIURETIC PEPTIDE  TROPONIN I (HIGH SENSITIVITY)  TROPONIN I (HIGH SENSITIVITY)    EKG EKG Interpretation Date/Time:  Monday May 15 2023 11:36:28 EDT Ventricular Rate:  91 PR Interval:  259 QRS Duration:  110 QT Interval:  374 QTC Calculation: 461 R Axis:   -55  Text Interpretation: Sinus rhythm Prolonged PR interval Inferior infarct, old Confirmed by Benjiman Core [phone]) on 05/15/2023 3:48:33 PM  Radiology DG Chest Portable 1 View  Result Date: 05/15/2023 CLINICAL DATA:  Altered mental status. EXAM: PORTABLE CHEST 1 VIEW COMPARISON:  05/07/2023. FINDINGS: There are increased interstitial markings, mainly in the bilateral lower lobes. Bilateral lungs are otherwise grossly clear. No dense consolidation or lung collapse. Right lateral costophrenic angle is clear. Left lateral costophrenic angle is not included in the film. Stable cardio-mediastinal silhouette. No acute osseous abnormalities. The soft tissues are within normal limits. IMPRESSION: 1. Increased interstitial markings, mainly in the bilateral lower lobes. This may reflect atelectasis  or early interstitial edema. Electronically Signed   By: Jules Schick  M.D.   On: 05/15/2023 14:15    Procedures Procedures    Medications Ordered in ED Medications  pregabalin (LYRICA) capsule 150 mg (has no administration in time range)  sodium chloride 0.9 % bolus 500 mL (500 mLs Intravenous New Bag/Given 05/15/23 1529)    ED Course/ Medical Decision Making/ A&P                                 Medical Decision Making Amount and/or Complexity of Data Reviewed Labs: ordered. Radiology: ordered.   Patient reported weakness.  Reportedly slurred speech.  Reviewing notes it appears that he may be at his baseline but just more fatigued.  Differential diagnosis longer does include electrolyte abnormality which is recently had.  Also infection.  Will get chest x-ray.  Will get basic blood work.  Will get urinalysis.  Workup reassuring.  Mild hypokalemia.  No UTI.  Had some mild hypotension but given some fluids.  Feeling somewhat better.  Mental status improved.  Potentially could be medication related.  Appears stable for discharge home however.  Will discharge        Final Clinical Impression(s) / ED Diagnoses Final diagnoses:  Weakness    Rx / DC Orders ED Discharge Orders     None         Benjiman Core, MD 05/15/23 1616

## 2023-05-16 NOTE — Telephone Encounter (Signed)
Pt checking on message from yesterday, if nurse has spoken with Dr. Teresa Coombs. Pt feeling shaking today, take 20 mins to type in phone number. Have an appt with PCP on 04/17/09/24, Thursday. Would like a call from the nurse

## 2023-05-18 DIAGNOSIS — Z6841 Body Mass Index (BMI) 40.0 and over, adult: Secondary | ICD-10-CM | POA: Diagnosis not present

## 2023-05-18 DIAGNOSIS — I480 Paroxysmal atrial fibrillation: Secondary | ICD-10-CM | POA: Diagnosis not present

## 2023-05-18 DIAGNOSIS — E1151 Type 2 diabetes mellitus with diabetic peripheral angiopathy without gangrene: Secondary | ICD-10-CM | POA: Diagnosis not present

## 2023-05-18 DIAGNOSIS — I110 Hypertensive heart disease with heart failure: Secondary | ICD-10-CM | POA: Diagnosis not present

## 2023-05-18 DIAGNOSIS — E876 Hypokalemia: Secondary | ICD-10-CM | POA: Diagnosis not present

## 2023-05-18 DIAGNOSIS — D6869 Other thrombophilia: Secondary | ICD-10-CM | POA: Diagnosis not present

## 2023-05-18 DIAGNOSIS — G40909 Epilepsy, unspecified, not intractable, without status epilepticus: Secondary | ICD-10-CM | POA: Diagnosis not present

## 2023-05-18 DIAGNOSIS — I5033 Acute on chronic diastolic (congestive) heart failure: Secondary | ICD-10-CM | POA: Diagnosis not present

## 2023-05-25 ENCOUNTER — Encounter: Payer: Self-pay | Admitting: "Endocrinology

## 2023-05-25 ENCOUNTER — Encounter (HOSPITAL_COMMUNITY): Payer: Self-pay | Admitting: Adult Health

## 2023-05-25 ENCOUNTER — Telehealth (HOSPITAL_COMMUNITY): Payer: Self-pay | Admitting: Adult Health

## 2023-05-25 ENCOUNTER — Ambulatory Visit: Payer: Medicare HMO | Admitting: "Endocrinology

## 2023-05-25 VITALS — BP 130/84 | HR 93 | Ht 74.0 in | Wt 346.0 lb

## 2023-05-25 DIAGNOSIS — Z7984 Long term (current) use of oral hypoglycemic drugs: Secondary | ICD-10-CM

## 2023-05-25 DIAGNOSIS — Z794 Long term (current) use of insulin: Secondary | ICD-10-CM | POA: Diagnosis not present

## 2023-05-25 DIAGNOSIS — Z7985 Long-term (current) use of injectable non-insulin antidiabetic drugs: Secondary | ICD-10-CM | POA: Diagnosis not present

## 2023-05-25 DIAGNOSIS — E782 Mixed hyperlipidemia: Secondary | ICD-10-CM | POA: Diagnosis not present

## 2023-05-25 DIAGNOSIS — E1165 Type 2 diabetes mellitus with hyperglycemia: Secondary | ICD-10-CM

## 2023-05-25 DIAGNOSIS — E11649 Type 2 diabetes mellitus with hypoglycemia without coma: Secondary | ICD-10-CM | POA: Diagnosis not present

## 2023-05-25 LAB — POCT GLYCOSYLATED HEMOGLOBIN (HGB A1C): Hemoglobin A1C: 6.4 % — AB (ref 4.0–5.6)

## 2023-05-25 NOTE — Telephone Encounter (Signed)
  Card[employer] Remote Monitoring  S/P Cardi[employer] Implant   PAD Goal: 14 Most recent reading: 18 suggestive of fluid accumulation .   Recommended changes: Please call. Verify he is taking torsemide 120 twice a day. Please set up follow up in the HF clinic.   I continue to review and analyze the patients PA pressures weekly (and more often as needed) to bring PA pressures within the optimal range.    Basilio Meadow NP-C  1:33 PM

## 2023-05-25 NOTE — Progress Notes (Signed)
Outpatient Endocrinology Note Lawrence Lincoln, MD  05/25/23   [name] [birth year] [id number]  Referring Provider: [name], PA-C Primary Care Provider: [name], PA-C Reason for consultation: Subjective   Assessment & Plan  Diagnoses and all orders for this visit:  Uncontrolled type 2 diabetes mellitus with hypoglycemia without coma (HCC) -     POCT glycosylated hemoglobin (Hb A1C)  Long-term insulin use (HCC)  Long term (current) use of oral hypoglycemic drugs  Long-term (current) use of injectable non-insulin antidiabetic drugs  Mixed hypercholesterolemia and hypertriglyceridemia    Diabetes complicated by stroke Hba1c goal less than 7.0, current Hba1c is 6.4. Will recommend Lawrence following: Farxiga 10 mg daily, metformin ER 2 g daily, Prandin 4 mg twice daily, Mounjaro 15 mg weekly Insulin regimen: Tresiba 40 units daily, decrease Novolog to 11 units tidac   Wants to stay on current regimen except dose escalation as needed   No known contraindications to any of above medications  Hyperlipidemia -Last LDL at goal: 60 -atorvastatin 40 mg qd, ?vascepa 1 gm 2 pills bid -Follow low fat diet and exercise   -Blood pressure goal <140/90 - Microalbumin/creatinine at goal < 30 - on ACE/ARB ?losartan 25 mg every day -diet changes including salt restriction -limit eating outside -counseled BP targets per standards of diabetes care -Uncontrolled blood pressure can lead to retinopathy, nephropathy and cardiovascular and atherosclerotic heart disease  Reviewed and counseled on: -A1C target -Blood sugar targets -Complications of uncontrolled diabetes  -Checking blood sugar before meals and bedtime and bring log next visit -All medications with mechanism of action and side effects -Hypoglycemia management: rule of 15's, Glucagon Emergency Kit and medical alert ID -low-carb low-fat plate-method diet -At least 20 minutes of physical activity  per day -Annual dilated retinal eye exam and foot exam -compliance and follow up needs -follow up as scheduled or earlier if problem gets worse  Call if blood sugar is less than 70 or consistently above 250    Take a 15 gm snack of carbohydrate at bedtime before you go to sleep if your blood sugar is less than 100.    If you are going to fast after midnight for a test or procedure, ask your physician for instructions on how to reduce/decrease your insulin dose.    Call if blood sugar is less than 70 or consistently above 250  -Treating a low sugar by rule of 15  (15 gms of sugar every 15 min until sugar is more than 70) If you feel your sugar is low, test your sugar to be sure If your sugar is low (less than 70), then take 15 grams of a fast acting Carbohydrate (3-4 glucose tablets or glucose gel or 4 ounces of juice or regular soda) Recheck your sugar 15 min after treating low to make sure it is more than 70 If sugar is still less than 70, treat again with 15 grams of carbohydrate          Don't drive Lawrence hour of hypoglycemia  If unconscious/unable to eat or drink by mouth, use glucagon injection or nasal spray baqsimi and call 911. Can repeat again in 15 min if still unconscious.  Return in about 3 months (around 08/25/2023).   I have reviewed current medications, nurse's notes, allergies, vital signs, past medical and surgical history, family medical history, and social history for this encounter. Counseled [name] on symptoms, examination findings, lab findings, imaging results, treatment decisions and monitoring and prognosis. Lawrence [name]  understood Lawrence recommendations and agrees with Lawrence treatment plan. All questions regarding treatment plan were fully answered.  [place], MD  05/25/23    History of Present Illness Lawrence [name] is a 67 y.o. year old male who presents for follow up of Type 2 diabetes mellitus.  ANTHONI [name] was first diagnosed in 2018.    Home  diabetes regimen: Farxiga 10 mg daily, metformin ER 2 g daily, Prandin 4 mg twice daily, Mounjaro 15 mg weekly Insulin regimen: Tresiba 40 units daily, Novolog 12 units tidac   He has been on progressively increasing number of medications for his diabetes over Lawrence last 5 years or so. Insulin was apparently used with small doses of insulin for 2 months in 2022.  COMPLICATIONS -  MI, + multiple stroke -  retinopathy +  neuropathy -  nephropathy  BLOOD SUGAR DATA  CGM interpretation: At today's visit, we reviewed her CGM downloads. Lawrence full report is scanned in Lawrence media. Reviewing Lawrence CGM trends, BG are well controlled across Lawrence day except rare random lows.  Physical Exam  BP 130/84   Pulse 93   Ht 6\' 2"  (1.88 m)   Wt (!) 346 lb (156.9 kg)   SpO2 97%   BMI 44.42 kg/m    Constitutional: well developed, well nourished Head: normocephalic, atraumatic Eyes: sclera anicteric, no redness Neck: supple Lungs: normal respiratory effort Neurology: alert and oriented Skin: dry, no appreciable rashes Musculoskeletal: no appreciable defects Psychiatric: normal mood and affect  Current Medications [name]'s Medications  New Prescriptions   No medications on file  Previous Medications   ACETAMINOPHEN (TYLENOL) 650 MG CR TABLET    Take 1,300 mg by mouth every 8 (eight) hours as needed for pain.   ALBUTEROL (VENTOLIN HFA) 108 (90 BASE) MCG/ACT INHALER    Inhale 2 puffs into Lawrence lungs every 6 (six) hours as needed for wheezing or shortness of breath.   ALCOHOL SWABS ([employer] PREP) 70 % PADS    USE AS DIRECTED   ALLOPURINOL (ZYLOPRIM) 100 MG TABLET    Take 200 mg by mouth 2 (two) times daily.   AMOXICILLIN (AMOXIL) 500 MG TABLET    Take 4 tablets (2,000 mg total) by mouth as directed. 1 hour prior to dental work including cleanings   APIXABAN (ELIQUIS) 5 MG TABS TABLET    Take 1 tablet (5 mg total) by mouth 2 (two) times daily.   ARIPIPRAZOLE (ABILIFY) 5 MG TABLET    TAKE ONE TABLET BY MOUTH  EVERYDAY AT BEDTIME   ATORVASTATIN (LIPITOR) 40 MG TABLET    Take 1 tablet (40 mg total) by mouth daily.   BUSPIRONE (BUSPAR) 10 MG TABLET    Take 1 tablet (10 mg total) by mouth 3 (three) times daily.   CETIRIZINE (ZYRTEC) 10 MG TABLET    Take 10 mg by mouth at bedtime.    CHOLECALCIFEROL (VITAMIN D3 PO)    Take 2,000 Units by mouth daily.   CYANOCOBALAMIN (VITAMIN B12) 1000 MCG TABLET    Take 1 tablet (1,000 mcg total) by mouth daily.   DAPAGLIFLOZIN PROPANEDIOL (FARXIGA) 10 MG TABS TABLET    TAKE ONE TABLET BY MOUTH BEFORE BREAKFAST   DICLOFENAC SODIUM (VOLTAREN) 1 % GEL    Apply 4 g topically 4 (four) times daily. To right knee.   FENOFIBRATE (TRICOR) 145 MG TABLET    Take 1 tablet (145 mg total) by mouth daily.   FERROUS SULFATE 324 (65 FE) MG TBEC  Take 324 mg by mouth daily with breakfast.    FLUTICASONE (FLONASE) 50 MCG/ACT NASAL SPRAY    Place 2 sprays into both nostrils daily.    FLUTICASONE-UMECLIDIN-VILANT (TRELEGY ELLIPTA) 200-62.5-25 MCG/ACT AEPB    INHALE 1 PUFF BY MOUTH INTO LUNGS ONCE DAILY   ICOSAPENT ETHYL (VASCEPA) 1 G CAPSULE    Take 2 capsules (2 g total) by mouth 2 (two) times daily.   INSULIN ASPART (NOVOLOG FLEXPEN) 100 UNIT/ML FLEXPEN    INJECT 12 UNITS BEFORE MEALS, keep sugar TWO HOURS AFTER meals UNDER 180 AT least   INSULIN PEN NEEDLE (B-D ULTRAFINE III SHORT PEN) 31G X 8 MM MISC    Inject 1 each into Lawrence skin 3 (three) times daily before meals. Use as instructed 4X daily.   LACOSAMIDE 150 MG TABS    Take 1 tablet (150 mg total) by mouth 2 (two) times daily.   LEVETIRACETAM (KEPPRA) 500 MG TABLET    Take 1 tablet (500 mg total) by mouth 2 (two) times daily.   METFORMIN (GLUCOPHAGE-XR) 500 MG 24 HR TABLET    Take 4 tablets (2,000 mg total) by mouth daily.   METOPROLOL SUCCINATE (TOPROL-XL) 50 MG 24 HR TABLET    Take 1 tablet (50 mg total) by mouth daily.   MIRTAZAPINE (REMERON) 15 MG TABLET    Take 1 tablet (15 mg total) by mouth at bedtime.   OMEPRAZOLE (PRILOSEC)  20 MG CAPSULE    Take 20 mg by mouth every morning.   POTASSIUM CHLORIDE SA (KLOR-CON M) 20 MEQ TABLET    Take 2 tablets (40 mEq total) by mouth 2 (two) times daily.   PRAZOSIN (MINIPRESS) 2 MG CAPSULE    Take 2 capsules (4 mg total) by mouth at bedtime.   PREGABALIN (LYRICA) 150 MG CAPSULE    Take 1 capsule (150 mg total) by mouth 3 (three) times daily.   REPAGLINIDE (PRANDIN) 2 MG TABLET    TAKE TWO TABLETS BY MOUTH TWICE DAILY BEFORE A meal   SPIRONOLACTONE (ALDACTONE) 25 MG TABLET    Take 0.5 tablets (12.5 mg total) by mouth daily.   TIRZEPATIDE (MOUNJARO) 15 MG/0.5ML PEN    Inject 15 mg into Lawrence skin once a week.   TORSEMIDE (DEMADEX) 20 MG TABLET    Take 6 tablets (120 mg total) by mouth in Lawrence morning and at bedtime.   TRAZODONE (DESYREL) 100 MG TABLET    Take 2 tablets (200 mg total) by mouth at bedtime.   TRESIBA FLEXTOUCH 100 UNIT/ML FLEXTOUCH PEN    INJECT 40 UNITS into Lawrence SKIN ONCE DAILY   VENLAFAXINE XR (EFFEXOR-XR) 75 MG 24 HR CAPSULE    Take 3 capsules (225 mg total) by mouth daily with breakfast.  Modified Medications   No medications on file  Discontinued Medications   No medications on file    Allergies Allergies  Allergen Reactions   Vancomycin Other (See Comments)    "Red Man Syndrome" 02/02/17: possible cause for rash under both arms   Niacin And Related Other (See Comments)    Red man syndrome   Tubersol [Tuberculin, Ppd] Other (See Comments)    Reaction unknown   Doxycycline Rash and Other (See Comments)    Past Medical History Past Medical History:  Diagnosis Date   Acquired dilation of ascending aorta and aortic root (HCC)    40mm by echo 01/2021   Adenomatous colon polyp 2007   Anemia    Anxiety    Asthma  BPH without obstruction/lower urinary tract symptoms 02/22/2017   Chronic diastolic (congestive) heart failure (HCC)    Chronic venous stasis 03/07/2019   COPD (chronic obstructive pulmonary disease) (HCC)    Coronary artery calcification seen  on CAT scan    Depression    Diabetic neuropathy (HCC) 09/11/2019   History of colon polyps 08/24/2018   Hypertension    Morbid obesity (HCC)    OSA (obstructive sleep apnea)    Pain due to onychomycosis of toenails of both feet 09/11/2019   Peripheral neuropathy 02/22/2017   Primary osteoarthritis, left shoulder 03/05/2017   PTSD (post-traumatic stress disorder)    Pure hypercholesterolemia    QT prolongation 03/07/2019   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 Lynnette Caffey and Dr. Laneta Simmers   Seizures [facility])    Severe aortic stenosis    Sinus tachycardia 03/07/2019   Sleep apnea    CPAP   Type 2 diabetes mellitus with vascular disease (HCC) 09/11/2019    Past Surgical History Past Surgical History:  Procedure Laterality Date   ENDOVENOUS ABLATION SAPHENOUS VEIN W/ LASER Right 08/20/2020   endovenous laser ablation right greater saphenous vein by Cari Caraway MD    ENDOVENOUS ABLATION SAPHENOUS VEIN W/ LASER Left 11/16/2022   endovenous laser ablation left greater saphenous vein by Cari Caraway MD   INTRAOPERATIVE TRANSTHORACIC ECHOCARDIOGRAM N/A 02/07/2023   Procedure: INTRAOPERATIVE TRANSTHORACIC ECHOCARDIOGRAM;  Surgeon: Orbie Pyo, MD;  Location: [facility] INVASIVE CV LAB;  Service: Open Heart Surgery;  Laterality: N/A;   JOINT REPLACEMENT     left knee replacement x 2   KNEE ARTHROSCOPY Bilateral    LEFT HEART CATH AND CORONARY ANGIOGRAPHY N/A 01/17/2018   Procedure: LEFT HEART CATH AND CORONARY ANGIOGRAPHY;  Surgeon: Marykay Lex, MD;  Location: [facility] INVASIVE CV LAB;  Service: Cardiovascular;  Laterality: N/A;   PRESSURE SENSOR/CARDIOMEMS N/A 08/26/2021   Procedure: PRESSURE SENSOR/CARDIOMEMS;  Surgeon: Laurey Morale, MD;  [facility] INVASIVE CV LAB;  Service: Cardiovascular;  Laterality: N/A;   RIGHT HEART CATH N/A 08/26/2021   Procedure: RIGHT HEART CATH;  Surgeon: Laurey Morale, MD;  Location: [facility]  INVASIVE CV LAB;  Service: Cardiovascular;  Laterality: N/A;   RIGHT HEART CATH AND CORONARY ANGIOGRAPHY N/A 01/26/2023   Procedure: RIGHT HEART CATH AND CORONARY ANGIOGRAPHY;  Surgeon: Orbie Pyo, MD;  Location: [facility] INVASIVE CV LAB;  Service: Cardiovascular;  Laterality: N/A;   TEE WITHOUT CARDIOVERSION N/A 08/26/2021   Procedure: TRANSESOPHAGEAL ECHOCARDIOGRAM (TEE);  Surgeon: Laurey Morale, MD;  Location[facility] ENDOSCOPY;  Service: Cardiovascular;  Laterality: N/A;   TOOTH EXTRACTION N/A 02/03/2023   Procedure: DENTAL RESTORATION/EXTRACTIONS;  Surgeon: [name], DMD;  Location: [facility] OR;  Service: Oral Surgery;  Laterality: N/A;   TRANSCATHETER AORTIC VALVE REPLACEMENT, TRANSFEMORAL Right 02/07/2023   Procedure: Transcatheter Aortic Valve Replacement, Transfemoral;  Surgeon: Orbie Pyo, MD;  Location: [facility] INVASIVE CV LAB;  Service: Open Heart Surgery;  Laterality: Right;   UMBILICAL HERNIA REPAIR      Family History family history includes CAD in his maternal grandfather; Diabetes in an other family member.  Social History Social History   Socioeconomic History   Marital status: Significant Other    Spouse name: [name]   Number of children: 1   Years of education: Not on file   Highest education level: Associate degree: academic program  Occupational History   Occupation: retired    Comment: Building control surveyor  Tobacco Use   Smoking status: Former    Current packs/day: 0.00    Average packs/day: 1 pack/day for 44.0 years (44.0 ttl pk-yrs)    Types: Cigarettes    Start date: 03/07/1972    Quit date: 03/07/2016    Years since quitting: 7.2    Passive exposure: Past   Smokeless tobacco: Never  Vaping Use   Vaping status: Never Used  Substance and Sexual Activity   Alcohol use: Yes    Comment: rare   Drug use: No   Sexual activity: Not on file  Other Topics Concern   Not on file  Social History Narrative   Lives with wife [name]   Right Handed   Drinks 1-2  cups caffeine   Social Determinants of Health   Financial Resource Strain: Medium Risk (12/06/2021)   Received from [facility], [facility]   Overall Financial Resource Strain (CARDIA)    Difficulty of Paying Living Expenses: Somewhat hard  Food Insecurity: Food Insecurity Present (05/07/2023)   Hunger Vital Sign    Worried About Running Out of Food in [place] Last Year: Sometimes true    Ran Out of Food in [place] Last Year: Sometimes true  Transportation Needs: No Transportation Needs (05/07/2023)   PRAPARE - Administrator, Civil Service (Medical): No    Lack of Transportation (Non-Medical): No  Physical Activity: Unknown (12/06/2021)   Received from [facility], [facility]   Exercise Vital Sign    Days of Exercise per Week: [name] declined    Minutes of Exercise per Session: 10 min  Stress: No Stress Concern Present (01/21/2022)   Received from [facility], [facility] of Occupational Health - Occupational Stress Questionnaire    Feeling of Stress : Only a little  Recent Concern: Stress - Stress Concern Present (11/19/2021)   Received from [facility] of Occupational Health - Occupational Stress Questionnaire    Feeling of Stress : To some extent  Social Connections: Unknown (12/18/2022)   Received from [facility], [facility]   Social Network    Social Network: Not on file  Intimate Partner Violence: Not At Risk (05/07/2023)   Humiliation, Afraid, Rape, and Kick questionnaire    Fear of Current or Ex-Partner: No    Emotionally Abused: No    Physically Abused: No    Sexually Abused: No    Lab Results  Component Value Date   HGBA1C 6.4 (A) 05/25/2023   HGBA1C 6.4 (H) 02/07/2023   HGBA1C 6.4 (A) 01/19/2023   Lab Results  Component Value Date   CHOL 139 12/05/2022   Lab Results  Component Value Date   HDL 31 (L) 12/05/2022   Lab Results  Component Value Date   LDLCALC 60 12/05/2022   Lab Results   Component Value Date   TRIG 239 (H) 12/05/2022   Lab Results  Component Value Date   CHOLHDL 4.5 12/05/2022   Lab Results  Component Value Date   CREATININE 1.00 05/15/2023   No results found for: "GFR" Lab Results  Component Value Date   MICROALBUR <0.7 03/23/2022      Component Value Date/Time   NA 140 05/15/2023 1203   NA 136 02/15/2023 1415   K 3.4 (L) 05/15/2023 1203   CL 96 (L) 05/15/2023 1203   CO2 28 05/15/2023 1203   GLUCOSE 121 (H) 05/15/2023 1203   BUN 19 05/15/2023 1203   BUN 37 (H) 02/15/2023 1415  CREATININE 1.00 05/15/2023 1203   CALCIUM 9.2 05/15/2023 1203   PROT 6.9 05/15/2023 1203   ALBUMIN 3.4 (L) 05/15/2023 1203   AST 19 05/15/2023 1203   ALT 21 05/15/2023 1203   ALKPHOS 67 05/15/2023 1203   BILITOT 0.3 05/15/2023 1203   GFRNONAA >60 05/15/2023 1203   GFRAA 77 05/20/2020 1038      Latest Ref Rng & Units 05/15/2023   12:03 PM 05/09/2023    6:36 AM 05/08/2023    3:02 PM  BMP  Glucose 70 - 99 mg/dL 161  096  045   BUN 8 - 23 mg/dL 19  15  22    Creatinine 0.61 - 1.24 mg/dL 4.09  8.11  9.14   Sodium 135 - 145 mmol/L 140  136  134   Potassium 3.5 - 5.1 mmol/L 3.4  3.7  3.3   Chloride 98 - 111 mmol/L 96  101  98   CO2 22 - 32 mmol/L 28  27  25    Calcium 8.9 - 10.3 mg/dL 9.2  8.8  8.9        Component Value Date/Time   WBC 13.7 (H) 05/15/2023 1203   RBC 5.05 05/15/2023 1203   HGB 14.2 05/15/2023 1203   HCT 44.2 05/15/2023 1203   PLT 244 05/15/2023 1203   MCV 87.5 05/15/2023 1203   MCH 28.1 05/15/2023 1203   MCHC 32.1 05/15/2023 1203   RDW 15.9 (H) 05/15/2023 1203   LYMPHSABS 1.6 05/15/2023 1203   MONOABS 0.8 05/15/2023 1203   EOSABS 0.3 05/15/2023 1203   BASOSABS 0.1 05/15/2023 1203     Parts of this note may have been dictated using voice recognition software. There may be variances in spelling and vocabulary which are unintentional. Not all errors are proofread. Please notify Lawrence Thereasa Parkin if any discrepancies are noted or if Lawrence  meaning of any statement is not clear.

## 2023-05-25 NOTE — Patient Instructions (Signed)

## 2023-05-25 NOTE — Telephone Encounter (Signed)
Pt confirms he is taking torsemide 120 mg BD   Follow up appt made 11/1 @130 

## 2023-05-25 NOTE — Progress Notes (Deleted)
Error.  See phone note.

## 2023-05-29 ENCOUNTER — Telehealth: Payer: Self-pay | Admitting: Adult Health

## 2023-05-29 NOTE — Telephone Encounter (Signed)
Pt called in and said his PTSD is causing him to have nightmares. Wakes up quite a bit with them. Of note, his voice was slurred and very hard to understand over the phone. Just said that he needs help with his nightmares.

## 2023-05-29 NOTE — Telephone Encounter (Signed)
Patient has difficulty getting to sleep. Reviewed all his meds as noted below. He said when he gets to sleep he doesn't stay asleep and wakes up with things running thru his mind. He said the prazosin does not help with NM. He denies any new stressors. He was seen last month, FU due in Dec.   Prazosin 2mg  - 2 tabs at hs Abilify 5mg  daily Effexor XR 75mg  - 3 every morning Buspar 5mg  TID  Mirtazapine 15mg  at hs Trazadone 200mg  at hs

## 2023-05-30 NOTE — Telephone Encounter (Signed)
Called patient and he was taking a bath.

## 2023-05-30 NOTE — Telephone Encounter (Signed)
Patient is not seeing any type of therapist, reported he had been doing well until now and he doesn't know why he is struggling now. Discussed sleep hygiene - usual bedtime, no late caffeine use, TV before bed.  He is agreeable to tapering prazosin.

## 2023-05-31 ENCOUNTER — Other Ambulatory Visit: Payer: Self-pay

## 2023-05-31 ENCOUNTER — Telehealth: Payer: Self-pay

## 2023-05-31 NOTE — Telephone Encounter (Addendum)
He takes 2 mg capsules - 2 at bedtime.  Should he just reduce to 1 capsule?

## 2023-05-31 NOTE — Telephone Encounter (Signed)
Lawrence [name] is calling asking about his lab results , please advise?

## 2023-06-01 NOTE — Telephone Encounter (Signed)
Patient notified

## 2023-06-07 ENCOUNTER — Other Ambulatory Visit: Payer: Self-pay | Admitting: "Endocrinology

## 2023-06-07 ENCOUNTER — Other Ambulatory Visit: Payer: Self-pay | Admitting: Adult Health

## 2023-06-07 DIAGNOSIS — F331 Major depressive disorder, recurrent, moderate: Secondary | ICD-10-CM

## 2023-06-07 DIAGNOSIS — E1165 Type 2 diabetes mellitus with hyperglycemia: Secondary | ICD-10-CM

## 2023-06-07 DIAGNOSIS — F411 Generalized anxiety disorder: Secondary | ICD-10-CM

## 2023-06-07 NOTE — Progress Notes (Signed)
PCP: [name], PA-C Cardiology: Dr. Mayford Knife HF Cardiology: Dr. Shirlee Latch  67 y.o. with history of paroxysmal atrial fibrillation, OSA/?OHS, morbid obesity, and chronic diastolic CHF was referred by Dr. Mayford Knife for evaluation of CHF.  Patient has been admitted multiple times in the last year for CHF exacerbations.  He uses a walker around the house and a motorized scooter outside the house.   He had a negative cath in 2019 and a negative Cardiolite in 4/22.  Echo in 11/22 was a difficult study but showed EF 60-65%, mild LVH, mildly dilated LV, moderate aortic stenosis (trileaflet aortic valve) with mean gradient 22 mmHg, DI 0.27.   He was admitted in 12/22 with fever/PNA as well as chest pain (ruled out for MI).  He had atrial fibrillation/RVR this admission but converted back to NSR.  He was diuresed. Additionally, he was thought to have a partial seizure and Keppra was restarted.   Cardiomems was placed 08/2021  Admitted 12/15/21 with left facial droop. CT of head negative. Neurology felt this was secondary to breakthrough seizures. Continued on Keppra.   Patient has been seen by [facility] for venous insufficiency, has had laser ablation of the great saphenous vein.   Echo in 10/23 showed EF 60-65%, mild LV dilation, normal RV, moderate AS with mean gradient 28 and AVA around 1 cm^2.    Admitted in 3/24 with CHF and diuresed.   Admitted 6/24 for acute hypoxic respiratory failure secondary to a/c CHF and severe AS.  He had ongoing chest pain and shortness of breath. Echo  showed EF 60-65%, severe AS w/ mean gradient 40 mmHg and AVR by VTI 1.03 cm, RV normal. R/LHC demonstrated mild-mod diffuse nonobstructive CAD, mRA 9, PAP 34/15 w/ mean 24, mPCW 14, FICK CO 9.7 L/min and Fick cardiac index of 3.5 L/min/m. He was diuresed over 30L. Had dental extractions and started on Keflex. Underwent TAVR with a 34 mm Medtronic Evolut FX THV via the TF approach on 02/07/23. Post operative echo showed EF 60%,  severe LVH, normally functioning TAVR with a mean gradient of 17 mmHg and no PVL and small pericardial effusion. Also discharged with a Zio AT given a new LBBB and pre op RBBB. W/u for amyloidosis also initiated. Multiple myeloma panel showed no M-spike protein. Kappa free light chain elevated at 23.1 however Kappa Lambda ratio WNL. PYP scan ordered. Discharge weight 368 lbs. Also of note, he was discharged home on a lower dose of torsemide, given he had diuresed a massive amount. Prior to admit, he was on torsemide 100 mg bid. He was discharged home on 80 mg BID + Farxiga 10 and spiro 12.5 mg.   Seen in structural heart clinic 1 wk post-op f/u. Was doing well. Groin sites healing well. BMP stable w/ SCr 1.06 and K 3.9. EKG showed junctional rhythm 87 bpm.  Plan to RTC in 1 month w/ repeat echo.   Zio AT showed mostly NSR, no high grade blocks, frequent ventricular beats  Echo 8/24 showed EF 55%, normally function TAVR with mean gradient 15.2 mmHg and no PVL, ascending aorta dilation 41 mm  Today he returns for HF follow up with his family member + service dog, Chole. Overall feeling fine. He has SOB walking short distances on flat ground. Occasional atypical CP/left shoulder. Resolves spontaneously.  Occasional palpitations and positional dizziness. Denies abnormal bleeding, edema, or PND/Orthopnea. Appetite ok. No fever or chills. Weight at home 346 pounds. Taking all medications. Seen in ED earlier this month  with weakness, K 3.4, no UTI. Has a wound on his bottom, but is healing. Has HH aide 4 days/week. Wears CPAP at night. Sleeps in hospital bed. Was drinking a lot of fluid recently, Mems up to 18 and was called and instructed to increase torsemide to 120 bid.  ECG (personally reviewed from 05/16/23): NSR 91 bpm  Cardiomems Reading today  = 16 Goal =14   Labs (11/22): K 3.7, creatinine 0.89 Labs (1/23): K 3.7, creatinine 0.93 => 0.84, BNP 18 => 37 Labs (03/09/22): K 3.6, creatinine 1 Labs (3/24):  K 3.8, creatinine 0.89, BNP 22 Labs (7/24): K 3.9, creatinine 1.06, Multiple Myeloma = No M-spike, KFLC elevated 23.1, K/L LC Ratio normal 1.27  Labs (10/24): K 3.4, creatinine 1.0  PMH: 1. Type 2 diabetes 2. Atrial fibrillation: Paroxysmal.  3. HTN 4. Hyperlipidemia 5. OSA, ?OHS: Uses Bipap.  6. Obesity 7. SVT 8. Chest pain: LHC in 6/19 with minimal CAD.  - Cardiolite (4/22): No ischemia.  9. Depression/anxiety.  10. Chronic diastolic CHF: Echo in 11/22 with EF 60-65%, mild LVH, mildly dilated LV, moderate aortic stenosis (trileaflet aortic valve) with mean gradient 22 mmHg, DI 0.27.  - RHC (1/23): RA mean 16, PA 49/24, mean PCWP 16, CI 3.13, PVR 2 WU.  - Cardomems in place.  - Echo (10/23): EF 60-65%, mild LV dilation, normal RV, moderate AS with mean gradient 28 and AVA around 1 cm^2.  11. Partial seizures 12. Aortic stenosis: TEE (1/23) with EF 60-65%, mild LVH, mild RVE with normal RV systolic function, mid AI, moderate AS with AVA 1.35 cm^2 and mean gradient 28 mmHg.  - Echo (10/23) with moderate AS with mean gradient 28 and AVA around 1 cm^2. - Echo (8/24): EF 55%, normal RV, normally functioning TAVR with mean gradient of 15.2 mmHg and no PVL. Mild dilation of ascending aorta 41 mm. 13. Venous insufficiency: Laser ablation of great saphenous vein.   Social History   Socioeconomic History   Marital status: Significant Other    Spouse name: [name]   Number of children: 1   Years of education: Not on file   Highest education level: Associate degree: academic program  Occupational History   Occupation: retired    Comment: Building control surveyor  Tobacco Use   Smoking status: Former    Current packs/day: 0.00    Average packs/day: 1 pack/day for 44.0 years (44.0 ttl pk-yrs)    Types: Cigarettes    Start date: 03/07/1972    Quit date: 03/07/2016    Years since quitting: 7.2    Passive exposure: Past   Smokeless tobacco: Never  Vaping Use   Vaping status: Never Used   Substance and Sexual Activity   Alcohol use: Yes    Comment: rare   Drug use: No   Sexual activity: Not on file  Other Topics Concern   Not on file  Social History Narrative   Lives with wife [name]   Right Handed   Drinks 1-2 cups caffeine   Social Determinants of Health   Financial Resource Strain: Medium Risk (12/06/2021)   Received from [facility], [facility]   Overall Financial Resource Strain (CARDIA)    Difficulty of Paying Living Expenses: Somewhat hard  Food Insecurity: Food Insecurity Present (05/07/2023)   Hunger Vital Sign    Worried About Running Out of Food in the Last Year: Sometimes true    Ran Out of Food in the Last Year: Sometimes true  Transportation Needs: No Transportation Needs (  05/07/2023)   PRAPARE - Administrator, Civil Service (Medical): No    Lack of Transportation (Non-Medical): No  Physical Activity: Unknown (12/06/2021)   Received from [facility], [facility]   Exercise Vital Sign    Days of Exercise per Week: Patient declined    Minutes of Exercise per Session: 10 min  Stress: No Stress Concern Present (01/21/2022)   Received from [facility], [facility] of Occupational Health - Occupational Stress Questionnaire    Feeling of Stress : Only a little  Recent Concern: Stress - Stress Concern Present (11/19/2021)   Received from [facility] of Occupational Health - Occupational Stress Questionnaire    Feeling of Stress : To some extent  Social Connections: Unknown (12/18/2022)   Received from [facility], [facility]   Social Network    Social Network: Not on file  Intimate Partner Violence: Not At Risk (05/07/2023)   Humiliation, Afraid, Rape, and Kick questionnaire    Fear of Current or Ex-Partner: No    Emotionally Abused: No    Physically Abused: No    Sexually Abused: No   Family History  Problem Relation Age of Onset   CAD Maternal Grandfather    Diabetes Other     Diabetes Mellitus II Neg Hx    Colon cancer Neg Hx    Esophageal cancer Neg Hx    Inflammatory bowel disease Neg Hx    Liver disease Neg Hx    Pancreatic cancer Neg Hx    Rectal cancer Neg Hx    Stomach cancer Neg Hx    Sleep apnea Neg Hx    ROS: All systems reviewed and negative except as per HPI.   Current Outpatient Medications  Medication Sig Dispense Refill   acetaminophen (TYLENOL) 650 MG CR tablet Take 1,300 mg by mouth every 8 (eight) hours as needed for pain.     albuterol (VENTOLIN HFA) 108 (90 Base) MCG/ACT inhaler Inhale 2 puffs into the lungs every 6 (six) hours as needed for wheezing or shortness of breath. 1 each 6   Alcohol Swabs ([employer] PREP) 70 % PADS USE AS DIRECTED 200 each 0   allopurinol (ZYLOPRIM) 100 MG tablet Take 200 mg by mouth 2 (two) times daily.     amoxicillin (AMOXIL) 500 MG tablet Take 4 tablets (2,000 mg total) by mouth as directed. 1 hour prior to dental work including cleanings 12 tablet 12   apixaban (ELIQUIS) 5 MG TABS tablet Take 1 tablet (5 mg total) by mouth 2 (two) times daily. 180 tablet 3   ARIPiprazole (ABILIFY) 5 MG tablet Take 1 tablet (5 mg total) by mouth at bedtime. 60 tablet 0   atorvastatin (LIPITOR) 40 MG tablet Take 1 tablet (40 mg total) by mouth daily. 90 tablet 3   busPIRone (BUSPAR) 10 MG tablet Take 1 tablet (10 mg total) by mouth 3 (three) times daily. 270 tablet 0   cetirizine (ZYRTEC) 10 MG tablet Take 10 mg by mouth at bedtime.      Cholecalciferol (VITAMIN D3 PO) Take 2,000 Units by mouth daily.     dapagliflozin propanediol (FARXIGA) 10 MG TABS tablet TAKE ONE TABLET BY MOUTH BEFORE BREAKFAST (Patient taking differently: Take 10 mg by mouth daily.) 90 tablet 3   diclofenac Sodium (VOLTAREN) 1 % GEL Apply 4 g topically 4 (four) times daily. To right knee. 100 g 0   fenofibrate (TRICOR) 145 MG tablet Take 1 tablet (145  mg total) by mouth daily. 90 tablet 3   ferrous sulfate 324 (65 Fe) MG TBEC Take 324 mg by mouth daily with  breakfast.      fluticasone (FLONASE) 50 MCG/ACT nasal spray Place 2 sprays into both nostrils daily.      Fluticasone-Umeclidin-Vilant (TRELEGY ELLIPTA) 200-62.5-25 MCG/ACT AEPB INHALE 1 PUFF BY MOUTH INTO LUNGS ONCE DAILY 60 each 11   icosapent Ethyl (VASCEPA) 1 g capsule Take 2 capsules (2 g total) by mouth 2 (two) times daily. 120 capsule 3   insulin aspart (NOVOLOG FLEXPEN) 100 UNIT/ML FlexPen INJECT 12 UNITS BEFORE MEALS, keep sugar TWO HOURS AFTER meals UNDER 180 AT least 15 mL 3   insulin degludec (TRESIBA FLEXTOUCH) 100 UNIT/ML FlexTouch Pen INJECT 40 UNITS INTO THE SKIN ONCE DAILY 10 mL 0   Insulin Pen Needle (B-D ULTRAFINE III SHORT PEN) 31G X 8 MM MISC Inject 1 each into the skin 3 (three) times daily before meals. Use as instructed 4X daily. 100 each 5   Lacosamide 150 MG TABS Take 1 tablet (150 mg total) by mouth 2 (two) times daily. 180 tablet 3   levETIRAcetam (KEPPRA) 500 MG tablet Take 1 tablet (500 mg total) by mouth 2 (two) times daily. 180 tablet 3   metFORMIN (GLUCOPHAGE-XR) 500 MG 24 hr tablet Take 4 tablets (2,000 mg total) by mouth daily. 360 tablet 3   metoprolol succinate (TOPROL-XL) 50 MG 24 hr tablet Take 1 tablet (50 mg total) by mouth daily. 90 tablet 3   mirtazapine (REMERON) 15 MG tablet Take 1 tablet (15 mg total) by mouth at bedtime. 30 tablet 0   omeprazole (PRILOSEC) 20 MG capsule Take 20 mg by mouth every morning.     potassium chloride SA (KLOR-CON M) 20 MEQ tablet Take 2 tablets (40 mEq total) by mouth 2 (two) times daily. 360 tablet 3   prazosin (MINIPRESS) 2 MG capsule Take 2 capsules (4 mg total) by mouth at bedtime. (Patient taking differently: Take 2 mg by mouth at bedtime.) 60 capsule 2   pregabalin (LYRICA) 150 MG capsule Take 1 capsule (150 mg total) by mouth 3 (three) times daily. 270 capsule 3   repaglinide (PRANDIN) 2 MG tablet TAKE TWO TABLETS BY MOUTH TWICE DAILY BEFORE A meal (Patient taking differently: Take 2 mg by mouth 2 (two) times daily  before a meal.) 120 tablet 0   spironolactone (ALDACTONE) 25 MG tablet Take 0.5 tablets (12.5 mg total) by mouth daily. 45 tablet 3   tirzepatide (MOUNJARO) 15 MG/0.5ML Pen INJECT 15 MG INTO THE SKIN ONCE A WEEK. 6 mL 0   torsemide (DEMADEX) 20 MG tablet Take 6 tablets (120 mg total) by mouth in the morning and at bedtime. 1080 tablet 3   traZODone (DESYREL) 100 MG tablet TAKE 2 TABLETS BY MOUTH AT BEDTIME. 120 tablet 0   venlafaxine XR (EFFEXOR-XR) 75 MG 24 hr capsule Take 3 capsules (225 mg total) by mouth daily with breakfast. 270 capsule 0   No current facility-administered medications for this encounter.   BP 120/64   Pulse 78   Wt (!) 156.9 kg (346 lb)   SpO2 92%   BMI 44.42 kg/m   Wt Readings from Last 3 Encounters:  06/09/23 (!) 156.9 kg (346 lb)  05/25/23 (!) 156.9 kg (346 lb)  05/15/23 (!) 155 kg (341 lb 11.4 oz)   PHYSICAL EXAM: General:  NAD. No resp difficulty, arrived in motorized scooter, chronically-ill appearing. HEENT: Normal Neck: Supple. Thick neck, no JVD.  Carotids 2+ bilat; no bruits. No lymphadenopathy or thryomegaly appreciated. Cor: PMI nondisplaced. Regular rate & rhythm. No rubs, gallops or murmurs. Lungs: Clear Abdomen: Soft, obese, nontender, nondistended. No hepatosplenomegaly. No bruits or masses. Good bowel sounds. Extremities: No cyanosis, clubbing, rash, trace pedal edema, chronic venous stasis changed to BLE Neuro: Alert & oriented x 3, cranial nerves grossly intact. Moves all 4 extremities w/o difficulty. Affect pleasant.  Assessment/Plan: 1. Chronic Diastolic CHF: Echo (11/22) with EF 60-65%, mild LVH, mildly dilated LV, moderate aortic stenosis (trileaflet aortic valve) with mean gradient 22 mmHg, DI 0.27.  Echo in 10/23 with EF 60-65%, mild LV dilation, normal RV, moderate AS with mean gradient 28 and AVA around 1 cm^2. Echo 5/24 EF 60-65%, severe AS w/ mean gradient 40 mmHg and AVR by VTI 1.03 cm, RV normal. R/LHC 6/24 mild-mod diffuse  nonobstructive CAD, mRA 9, PAP 34/15 w/ mean 24, mPCW 14, FICK CO 9.7 L/min and Fick cardiac index of 3.5 L/min/m. S/p TAVR 02/07/23. Echo 7/24 EF 60-65%, RV hyperdynamic. Echo 8/24 showed EF 55%, stable TAVR. Chronically NYHA III, confounded by obesity and deconditioning. Exam difficult for volume but Cardiomems very mildly elevated. - Increase spironolactone to 25 mg daily, decrease KCL to 40/20. BMET/BNP today, repeat BMET in 1 week. - Continue torsemide 120 mg bid - Continue daily CardioMEMs transmissions for remote monitoring  - Continue Farxiga 10 mg daily  - Continue Toprol XL 50 mg daily  - Given constellation of peripheral neuropathy, diastolic CHF and conduction system disease/atrial arrhythmias, he is undergoing w/u to screen for cardiac amyloidosis. Multiple Myeloma = No M-spike, KFLC elevated 23.1, K/L LC Ratio normal 1.27. PYP scan was equivocal. Consider repeating in a 1-2 years. 2. Aortic stenosis: Moderate AS on TEE in 1/23 and echo in 10/23. Severe by TTE 5/24 w/ mean gradient 40 mmHg, AVR by VTI 1.03 cm. LHC w/ nonobstructive CAD. S/p TAVR 7/24 with 34 mm Medtronic Evolut valve. No paravalvular leak and preserved LV function on echo post TAVR.  - Echo 8/24 showed EF 55%, normally function TAVR with mean gradient 15.2 mmHg and no PVL - Needs SBE prophylaxis prior to dental work  3. Atrial fibrillation: Paroxysmal. Zio AT post TAVR showed no high grade blocks. Regular on exam today. - Continue apixaban 5 mg bid. No bleeding issues. - Continue Toprol XL  4. New LBBB and pre op RBBB, s/p TAVR - as above, Zio AT with no high grade heart blocks. - Most recent ECG with QRS 110 msec 4. OSA/?OHS: Using Bipap nightly. Continue Bipap.  5. Obesity: Body mass index is 44.42 kg/m. - Continue tirzepatide, wt is trending down  6. Hyperlipidemia: On statin.  - recent Lipid panel 4/24 showed LDL to be at goal at 60 mg/dL  7. Seizures: On Keppra - followed by neurology   Follow up in 3  months with Dr. Park Liter, PA-C  06/09/2023

## 2023-06-09 ENCOUNTER — Ambulatory Visit (HOSPITAL_COMMUNITY)
Admission: RE | Admit: 2023-06-09 | Discharge: 2023-06-09 | Disposition: A | Discharge status: Home / Self Care | Payer: Medicare HMO | Source: Ambulatory Visit | Attending: Family Medicine | Admitting: Family Medicine

## 2023-06-09 ENCOUNTER — Telehealth (HOSPITAL_COMMUNITY): Payer: Self-pay

## 2023-06-09 ENCOUNTER — Encounter (HOSPITAL_COMMUNITY): Payer: Self-pay

## 2023-06-09 VITALS — BP 120/64 | HR 78 | Wt 346.0 lb

## 2023-06-09 DIAGNOSIS — I2510 Atherosclerotic heart disease of native coronary artery without angina pectoris: Secondary | ICD-10-CM | POA: Diagnosis not present

## 2023-06-09 DIAGNOSIS — E785 Hyperlipidemia, unspecified: Secondary | ICD-10-CM | POA: Diagnosis not present

## 2023-06-09 DIAGNOSIS — I110 Hypertensive heart disease with heart failure: Secondary | ICD-10-CM | POA: Diagnosis not present

## 2023-06-09 DIAGNOSIS — Z6841 Body Mass Index (BMI) 40.0 and over, adult: Secondary | ICD-10-CM | POA: Diagnosis not present

## 2023-06-09 DIAGNOSIS — Z952 Presence of prosthetic heart valve: Secondary | ICD-10-CM

## 2023-06-09 DIAGNOSIS — Z7901 Long term (current) use of anticoagulants: Secondary | ICD-10-CM | POA: Insufficient documentation

## 2023-06-09 DIAGNOSIS — I480 Paroxysmal atrial fibrillation: Secondary | ICD-10-CM | POA: Insufficient documentation

## 2023-06-09 DIAGNOSIS — I5032 Chronic diastolic (congestive) heart failure: Secondary | ICD-10-CM | POA: Diagnosis not present

## 2023-06-09 DIAGNOSIS — Z79899 Other long term (current) drug therapy: Secondary | ICD-10-CM | POA: Insufficient documentation

## 2023-06-09 DIAGNOSIS — I452 Bifascicular block: Secondary | ICD-10-CM | POA: Insufficient documentation

## 2023-06-09 DIAGNOSIS — Z95811 Presence of heart assist device: Secondary | ICD-10-CM | POA: Diagnosis not present

## 2023-06-09 DIAGNOSIS — I2583 Coronary atherosclerosis due to lipid rich plaque: Secondary | ICD-10-CM

## 2023-06-09 DIAGNOSIS — I447 Left bundle-branch block, unspecified: Secondary | ICD-10-CM | POA: Diagnosis not present

## 2023-06-09 DIAGNOSIS — I350 Nonrheumatic aortic (valve) stenosis: Secondary | ICD-10-CM | POA: Diagnosis not present

## 2023-06-09 DIAGNOSIS — R569 Unspecified convulsions: Secondary | ICD-10-CM | POA: Diagnosis not present

## 2023-06-09 DIAGNOSIS — Z87891 Personal history of nicotine dependence: Secondary | ICD-10-CM | POA: Insufficient documentation

## 2023-06-09 DIAGNOSIS — G4733 Obstructive sleep apnea (adult) (pediatric): Secondary | ICD-10-CM | POA: Diagnosis not present

## 2023-06-09 DIAGNOSIS — Z7985 Long-term (current) use of injectable non-insulin antidiabetic drugs: Secondary | ICD-10-CM | POA: Insufficient documentation

## 2023-06-09 DIAGNOSIS — E782 Mixed hyperlipidemia: Secondary | ICD-10-CM | POA: Diagnosis not present

## 2023-06-09 DIAGNOSIS — G629 Polyneuropathy, unspecified: Secondary | ICD-10-CM | POA: Insufficient documentation

## 2023-06-09 LAB — BRAIN NATRIURETIC PEPTIDE: B Natriuretic Peptide: 23.1 pg/mL (ref 0.0–100.0)

## 2023-06-09 LAB — BASIC METABOLIC PANEL
Anion gap: 9 (ref 5–15)
BUN: 38 mg/dL — ABNORMAL HIGH (ref 8–23)
CO2: 27 mmol/L (ref 22–32)
Calcium: 9.9 mg/dL (ref 8.9–10.3)
Chloride: 103 mmol/L (ref 98–111)
Creatinine, Ser: 1.31 mg/dL — ABNORMAL HIGH (ref 0.61–1.24)
GFR, Estimated: >60 mL/min (ref >60)
Glucose, Bld: 178 mg/dL — ABNORMAL HIGH (ref 70–99)
Potassium: 4 mmol/L (ref 3.5–5.1)
Sodium: 139 mmol/L (ref 135–145)

## 2023-06-09 MED ORDER — POTASSIUM CHLORIDE CRYS ER 20 MEQ PO TBCR: EXTENDED_RELEASE_TABLET | ORAL | 3 refills | Status: DC

## 2023-06-09 MED ORDER — SPIRONOLACTONE 25 MG PO TABS: 25.0000 mg | ORAL_TABLET | Freq: Every day | ORAL | 3 refills | Status: DC

## 2023-06-09 NOTE — Patient Instructions (Addendum)
,  Medication Changes:  INCREASE SPIRONOLACTONE TO 25MG  ONCE DAILY   DECREASE POTASSIUM TO TAKE (2 TABLETS) IN THE MORNING, TAKE (1 TABLET) IN THE EVENING  Lab Work:  Labs done today, your results will be available in MyChart, we will contact you for abnormal readings.  THEN BLOOD WORK AGAIN IN 7 DAYS WITH HOME HEALTH   Follow-Up in: 3 MONTHS WITH DR. Shirlee Latch PLEASE CALL OUR OFFICE AROUND DECEMBER TO GET SCHEDULED FOR YOUR APPOINTMENT. PHONE NUMBER IS [phone] OPTION 2   At the [facility], you and your health needs are our priority. We have a designated team specialized in the treatment of Heart Failure. This Care Team includes your primary Heart Failure Specialized Cardiologist (physician), Advanced Practice Providers (APPs- Physician Assistants and Nurse Practitioners), and Pharmacist who all work together to provide you with the care you need, when you need it.   You may see any of the following providers on your designated Care Team at your next follow up:  Dr. Arvilla Meres Dr. Marca Ancona Dr. Dorthula Nettles Dr. Theresia Bough Tonye Becket, NP Robbie Lis, [place] [facility] [place], [place] Brynda Peon, NP [place] Lee, NP Karle Plumber, PharmD   Please be sure to bring in all your medications bottles to every appointment.   Need to Contact us:  If you have any questions or concerns before your next appointment please send [place] a message through [place] or call our office at [phone].    TO LEAVE A MESSAGE FOR THE NURSE SELECT OPTION 2, PLEASE LEAVE A MESSAGE INCLUDING: YOUR NAME DATE OF BIRTH CALL BACK NUMBER REASON FOR CALL**this is important as we prioritize the call backs  YOU WILL RECEIVE A CALL BACK THE SAME DAY AS LONG AS YOU CALL BEFORE 4:00 PM

## 2023-06-09 NOTE — Addendum Note (Signed)
Addended by: Bea Laura B on: 06/09/2023 03:00 PM   Modules accepted: Orders

## 2023-06-09 NOTE — Telephone Encounter (Signed)
Spoke with [facility]- they are unable to do labs due to nursing not seeing patient- only OT/PT   Spoke with patient, scheduled repeat BMET in 1 week. Patient aware and verbalized understanding.

## 2023-06-09 NOTE — Telephone Encounter (Signed)
Called [facility] to order repeat BMET for 7 days to be completed. Attempted to do this via phone unable to reach. Left message with clinical manager to call back.

## 2023-06-11 ENCOUNTER — Encounter (HOSPITAL_COMMUNITY): Payer: Self-pay | Admitting: Cardiology

## 2023-06-15 ENCOUNTER — Encounter: Payer: Self-pay | Admitting: Podiatry

## 2023-06-15 ENCOUNTER — Ambulatory Visit (INDEPENDENT_AMBULATORY_CARE_PROVIDER_SITE_OTHER): Payer: Medicare HMO | Admitting: Podiatry

## 2023-06-15 DIAGNOSIS — M79674 Pain in right toe(s): Secondary | ICD-10-CM | POA: Diagnosis not present

## 2023-06-15 DIAGNOSIS — M79675 Pain in left toe(s): Secondary | ICD-10-CM | POA: Diagnosis not present

## 2023-06-15 DIAGNOSIS — B351 Tinea unguium: Secondary | ICD-10-CM | POA: Diagnosis not present

## 2023-06-15 DIAGNOSIS — E1142 Type 2 diabetes mellitus with diabetic polyneuropathy: Secondary | ICD-10-CM

## 2023-06-15 DIAGNOSIS — I878 Other specified disorders of veins: Secondary | ICD-10-CM

## 2023-06-15 NOTE — Progress Notes (Signed)
This patient returns to my office for at risk foot care.  This patient requires this care by a professional since this patient will be at risk due to having diabetic neuropathy and angiopathy and venous stasis.  . This patient is unable to cut nails himself since the patient cannot reach his nails.These nails are painful walking and wearing shoes.This patient presents for at risk foot care today.  General Appearance  Alert, conversant and in no acute stress.  Vascular  Dorsalis pedis and posterior tibial  pulses are not  palpable  billaterally due to severe swelling feet.  Capillary return is within normal limits  bilaterally. Temperature is within normal limits  Bilaterally. Venous stasis right leg.  Neurologic  Senn-Weinstein monofilament wire test absent right foot.  Diminished left foot.. Muscle power within normal limits bilaterally.  Nails Thick disfigured discolored nails with subungual debris  from hallux to fifth toes bilaterally.  Pincer hallux nails   B/L.   Orthopedic  No limitations of motion  feet .  No crepitus or effusions noted.  No bony pathology or digital deformities noted.  Severe HAV  B/L.  Skin  normotropic skin with no porokeratosis noted bilaterally.  No signs of infections or ulcers noted.     Onychomycosis  Pain in right toes  Pain in left toes    Consent was obtained for treatment procedures.   Mechanical debridement of nails 1-5  bilaterally performed with a nail nipper.  Filed with dremel without incident.  Hematoma fifth toe right foot.   Return office visit   3 months                   Told patient to return for periodic foot care and evaluation due to potential at risk complications.   Helane Gunther DPM

## 2023-06-16 ENCOUNTER — Ambulatory Visit (HOSPITAL_COMMUNITY)
Admission: RE | Admit: 2023-06-16 | Discharge: 2023-06-16 | Disposition: A | Discharge status: Home / Self Care | Payer: Medicare HMO | Source: Ambulatory Visit | Attending: Cardiology | Admitting: Cardiology

## 2023-06-16 DIAGNOSIS — I5032 Chronic diastolic (congestive) heart failure: Secondary | ICD-10-CM | POA: Insufficient documentation

## 2023-06-16 LAB — BASIC METABOLIC PANEL
Anion gap: 11 (ref 5–15)
BUN: 32 mg/dL — ABNORMAL HIGH (ref 8–23)
CO2: 29 mmol/L (ref 22–32)
Calcium: 9.2 mg/dL (ref 8.9–10.3)
Chloride: 101 mmol/L (ref 98–111)
Creatinine, Ser: 1.24 mg/dL (ref 0.61–1.24)
GFR, Estimated: >60 mL/min (ref >60)
Glucose, Bld: 179 mg/dL — ABNORMAL HIGH (ref 70–99)
Potassium: 4.1 mmol/L (ref 3.5–5.1)
Sodium: 141 mmol/L (ref 135–145)

## 2023-06-24 ENCOUNTER — Other Ambulatory Visit: Payer: Self-pay

## 2023-06-24 ENCOUNTER — Emergency Department (HOSPITAL_COMMUNITY): Payer: Medicare HMO

## 2023-06-24 ENCOUNTER — Encounter (HOSPITAL_COMMUNITY): Payer: Self-pay

## 2023-06-24 ENCOUNTER — Emergency Department (HOSPITAL_COMMUNITY): Payer: Non-veteran care

## 2023-06-24 ENCOUNTER — Inpatient Hospital Stay (HOSPITAL_COMMUNITY)
Admission: EM | Admit: 2023-06-24 | Discharge: 2023-06-28 | DRG: 064 | Disposition: A | Discharge status: Rehabilitation Facility | Payer: Non-veteran care | Attending: Internal Medicine | Admitting: Internal Medicine

## 2023-06-24 DIAGNOSIS — I5033 Acute on chronic diastolic (congestive) heart failure: Secondary | ICD-10-CM | POA: Diagnosis not present

## 2023-06-24 DIAGNOSIS — I83018 Varicose veins of right lower extremity with ulcer other part of lower leg: Secondary | ICD-10-CM | POA: Diagnosis present

## 2023-06-24 DIAGNOSIS — F329 Major depressive disorder, single episode, unspecified: Secondary | ICD-10-CM | POA: Diagnosis present

## 2023-06-24 DIAGNOSIS — K5900 Constipation, unspecified: Secondary | ICD-10-CM | POA: Diagnosis not present

## 2023-06-24 DIAGNOSIS — E1165 Type 2 diabetes mellitus with hyperglycemia: Secondary | ICD-10-CM

## 2023-06-24 DIAGNOSIS — I10 Essential (primary) hypertension: Secondary | ICD-10-CM | POA: Diagnosis present

## 2023-06-24 DIAGNOSIS — N1831 Chronic kidney disease, stage 3a: Secondary | ICD-10-CM | POA: Diagnosis not present

## 2023-06-24 DIAGNOSIS — G9341 Metabolic encephalopathy: Secondary | ICD-10-CM | POA: Diagnosis present

## 2023-06-24 DIAGNOSIS — F09 Unspecified mental disorder due to known physiological condition: Secondary | ICD-10-CM | POA: Diagnosis not present

## 2023-06-24 DIAGNOSIS — I69354 Hemiplegia and hemiparesis following cerebral infarction affecting left non-dominant side: Secondary | ICD-10-CM | POA: Diagnosis not present

## 2023-06-24 DIAGNOSIS — I700 Atherosclerosis of aorta: Secondary | ICD-10-CM | POA: Diagnosis not present

## 2023-06-24 DIAGNOSIS — E1140 Type 2 diabetes mellitus with diabetic neuropathy, unspecified: Secondary | ICD-10-CM | POA: Diagnosis not present

## 2023-06-24 DIAGNOSIS — I639 Cerebral infarction, unspecified: Secondary | ICD-10-CM | POA: Diagnosis present

## 2023-06-24 DIAGNOSIS — G8929 Other chronic pain: Secondary | ICD-10-CM | POA: Diagnosis present

## 2023-06-24 DIAGNOSIS — Z860101 Personal history of adenomatous and serrated colon polyps: Secondary | ICD-10-CM

## 2023-06-24 DIAGNOSIS — D72829 Elevated white blood cell count, unspecified: Secondary | ICD-10-CM | POA: Diagnosis not present

## 2023-06-24 DIAGNOSIS — Z7985 Long-term (current) use of injectable non-insulin antidiabetic drugs: Secondary | ICD-10-CM

## 2023-06-24 DIAGNOSIS — M10071 Idiopathic gout, right ankle and foot: Secondary | ICD-10-CM | POA: Diagnosis not present

## 2023-06-24 DIAGNOSIS — I63442 Cerebral infarction due to embolism of left cerebellar artery: Secondary | ICD-10-CM | POA: Diagnosis not present

## 2023-06-24 DIAGNOSIS — E1122 Type 2 diabetes mellitus with diabetic chronic kidney disease: Secondary | ICD-10-CM | POA: Diagnosis present

## 2023-06-24 DIAGNOSIS — Z9889 Other specified postprocedural states: Secondary | ICD-10-CM

## 2023-06-24 DIAGNOSIS — R404 Transient alteration of awareness: Secondary | ICD-10-CM | POA: Diagnosis not present

## 2023-06-24 DIAGNOSIS — G40909 Epilepsy, unspecified, not intractable, without status epilepticus: Secondary | ICD-10-CM | POA: Diagnosis not present

## 2023-06-24 DIAGNOSIS — M19012 Primary osteoarthritis, left shoulder: Secondary | ICD-10-CM | POA: Diagnosis present

## 2023-06-24 DIAGNOSIS — I63542 Cerebral infarction due to unspecified occlusion or stenosis of left cerebellar artery: Secondary | ICD-10-CM | POA: Diagnosis not present

## 2023-06-24 DIAGNOSIS — Z7984 Long term (current) use of oral hypoglycemic drugs: Secondary | ICD-10-CM

## 2023-06-24 DIAGNOSIS — I83028 Varicose veins of left lower extremity with ulcer other part of lower leg: Secondary | ICD-10-CM | POA: Diagnosis present

## 2023-06-24 DIAGNOSIS — I350 Nonrheumatic aortic (valve) stenosis: Secondary | ICD-10-CM | POA: Diagnosis present

## 2023-06-24 DIAGNOSIS — N400 Enlarged prostate without lower urinary tract symptoms: Secondary | ICD-10-CM | POA: Diagnosis present

## 2023-06-24 DIAGNOSIS — G629 Polyneuropathy, unspecified: Secondary | ICD-10-CM | POA: Diagnosis not present

## 2023-06-24 DIAGNOSIS — R29705 NIHSS score 5: Secondary | ICD-10-CM | POA: Diagnosis not present

## 2023-06-24 DIAGNOSIS — Z794 Long term (current) use of insulin: Secondary | ICD-10-CM

## 2023-06-24 DIAGNOSIS — Z5986 Financial insecurity: Secondary | ICD-10-CM

## 2023-06-24 DIAGNOSIS — L97829 Non-pressure chronic ulcer of other part of left lower leg with unspecified severity: Secondary | ICD-10-CM | POA: Diagnosis present

## 2023-06-24 DIAGNOSIS — I452 Bifascicular block: Secondary | ICD-10-CM | POA: Diagnosis present

## 2023-06-24 DIAGNOSIS — M109 Gout, unspecified: Secondary | ICD-10-CM | POA: Diagnosis present

## 2023-06-24 DIAGNOSIS — Z66 Do not resuscitate: Secondary | ICD-10-CM | POA: Diagnosis not present

## 2023-06-24 DIAGNOSIS — I6523 Occlusion and stenosis of bilateral carotid arteries: Secondary | ICD-10-CM | POA: Diagnosis not present

## 2023-06-24 DIAGNOSIS — G4700 Insomnia, unspecified: Secondary | ICD-10-CM | POA: Diagnosis not present

## 2023-06-24 DIAGNOSIS — Z6841 Body Mass Index (BMI) 40.0 and over, adult: Secondary | ICD-10-CM | POA: Diagnosis not present

## 2023-06-24 DIAGNOSIS — R29810 Facial weakness: Secondary | ICD-10-CM | POA: Diagnosis not present

## 2023-06-24 DIAGNOSIS — I878 Other specified disorders of veins: Secondary | ICD-10-CM | POA: Diagnosis present

## 2023-06-24 DIAGNOSIS — J4489 Other specified chronic obstructive pulmonary disease: Secondary | ICD-10-CM | POA: Diagnosis present

## 2023-06-24 DIAGNOSIS — Z96652 Presence of left artificial knee joint: Secondary | ICD-10-CM | POA: Diagnosis present

## 2023-06-24 DIAGNOSIS — I951 Orthostatic hypotension: Secondary | ICD-10-CM | POA: Diagnosis not present

## 2023-06-24 DIAGNOSIS — I771 Stricture of artery: Secondary | ICD-10-CM | POA: Diagnosis not present

## 2023-06-24 DIAGNOSIS — I130 Hypertensive heart and chronic kidney disease with heart failure and stage 1 through stage 4 chronic kidney disease, or unspecified chronic kidney disease: Secondary | ICD-10-CM | POA: Diagnosis present

## 2023-06-24 DIAGNOSIS — Z8249 Family history of ischemic heart disease and other diseases of the circulatory system: Secondary | ICD-10-CM

## 2023-06-24 DIAGNOSIS — R600 Localized edema: Secondary | ICD-10-CM | POA: Diagnosis not present

## 2023-06-24 DIAGNOSIS — E1142 Type 2 diabetes mellitus with diabetic polyneuropathy: Secondary | ICD-10-CM | POA: Diagnosis not present

## 2023-06-24 DIAGNOSIS — I480 Paroxysmal atrial fibrillation: Secondary | ICD-10-CM | POA: Diagnosis present

## 2023-06-24 DIAGNOSIS — J9602 Acute respiratory failure with hypercapnia: Secondary | ICD-10-CM | POA: Diagnosis present

## 2023-06-24 DIAGNOSIS — R55 Syncope and collapse: Secondary | ICD-10-CM | POA: Diagnosis not present

## 2023-06-24 DIAGNOSIS — Z833 Family history of diabetes mellitus: Secondary | ICD-10-CM

## 2023-06-24 DIAGNOSIS — I5032 Chronic diastolic (congestive) heart failure: Secondary | ICD-10-CM | POA: Diagnosis present

## 2023-06-24 DIAGNOSIS — R4781 Slurred speech: Secondary | ICD-10-CM | POA: Diagnosis present

## 2023-06-24 DIAGNOSIS — I482 Chronic atrial fibrillation, unspecified: Secondary | ICD-10-CM | POA: Diagnosis present

## 2023-06-24 DIAGNOSIS — F410 Panic disorder [episodic paroxysmal anxiety] without agoraphobia: Secondary | ICD-10-CM | POA: Diagnosis present

## 2023-06-24 DIAGNOSIS — R531 Weakness: Secondary | ICD-10-CM | POA: Diagnosis not present

## 2023-06-24 DIAGNOSIS — Z888 Allergy status to other drugs, medicaments and biological substances status: Secondary | ICD-10-CM

## 2023-06-24 DIAGNOSIS — R9089 Other abnormal findings on diagnostic imaging of central nervous system: Secondary | ICD-10-CM | POA: Diagnosis not present

## 2023-06-24 DIAGNOSIS — E876 Hypokalemia: Secondary | ICD-10-CM | POA: Diagnosis not present

## 2023-06-24 DIAGNOSIS — R0902 Hypoxemia: Secondary | ICD-10-CM | POA: Diagnosis not present

## 2023-06-24 DIAGNOSIS — G4733 Obstructive sleep apnea (adult) (pediatric): Secondary | ICD-10-CM | POA: Diagnosis not present

## 2023-06-24 DIAGNOSIS — J441 Chronic obstructive pulmonary disease with (acute) exacerbation: Secondary | ICD-10-CM | POA: Diagnosis not present

## 2023-06-24 DIAGNOSIS — I6389 Other cerebral infarction: Secondary | ICD-10-CM | POA: Diagnosis not present

## 2023-06-24 DIAGNOSIS — F5104 Psychophysiologic insomnia: Secondary | ICD-10-CM | POA: Diagnosis present

## 2023-06-24 DIAGNOSIS — R29703 NIHSS score 3: Secondary | ICD-10-CM | POA: Diagnosis not present

## 2023-06-24 DIAGNOSIS — L97819 Non-pressure chronic ulcer of other part of right lower leg with unspecified severity: Secondary | ICD-10-CM | POA: Diagnosis present

## 2023-06-24 DIAGNOSIS — F32A Depression, unspecified: Secondary | ICD-10-CM | POA: Diagnosis present

## 2023-06-24 DIAGNOSIS — R479 Unspecified speech disturbances: Secondary | ICD-10-CM | POA: Diagnosis not present

## 2023-06-24 DIAGNOSIS — N179 Acute kidney failure, unspecified: Secondary | ICD-10-CM | POA: Diagnosis not present

## 2023-06-24 DIAGNOSIS — I493 Ventricular premature depolarization: Secondary | ICD-10-CM | POA: Diagnosis not present

## 2023-06-24 DIAGNOSIS — Z881 Allergy status to other antibiotic agents status: Secondary | ICD-10-CM

## 2023-06-24 DIAGNOSIS — E662 Morbid (severe) obesity with alveolar hypoventilation: Secondary | ICD-10-CM | POA: Diagnosis present

## 2023-06-24 DIAGNOSIS — F5101 Primary insomnia: Secondary | ICD-10-CM | POA: Diagnosis not present

## 2023-06-24 DIAGNOSIS — Z7951 Long term (current) use of inhaled steroids: Secondary | ICD-10-CM

## 2023-06-24 DIAGNOSIS — I2510 Atherosclerotic heart disease of native coronary artery without angina pectoris: Secondary | ICD-10-CM | POA: Diagnosis present

## 2023-06-24 DIAGNOSIS — Z952 Presence of prosthetic heart valve: Secondary | ICD-10-CM | POA: Diagnosis not present

## 2023-06-24 DIAGNOSIS — Z5941 Food insecurity: Secondary | ICD-10-CM

## 2023-06-24 DIAGNOSIS — L97911 Non-pressure chronic ulcer of unspecified part of right lower leg limited to breakdown of skin: Secondary | ICD-10-CM | POA: Diagnosis not present

## 2023-06-24 DIAGNOSIS — L97921 Non-pressure chronic ulcer of unspecified part of left lower leg limited to breakdown of skin: Secondary | ICD-10-CM | POA: Diagnosis not present

## 2023-06-24 DIAGNOSIS — J9601 Acute respiratory failure with hypoxia: Secondary | ICD-10-CM

## 2023-06-24 DIAGNOSIS — I83019 Varicose veins of right lower extremity with ulcer of unspecified site: Secondary | ICD-10-CM | POA: Diagnosis not present

## 2023-06-24 DIAGNOSIS — I77810 Thoracic aortic ectasia: Secondary | ICD-10-CM | POA: Diagnosis present

## 2023-06-24 DIAGNOSIS — E7800 Pure hypercholesterolemia, unspecified: Secondary | ICD-10-CM | POA: Diagnosis present

## 2023-06-24 DIAGNOSIS — M792 Neuralgia and neuritis, unspecified: Secondary | ICD-10-CM | POA: Diagnosis not present

## 2023-06-24 DIAGNOSIS — I6782 Cerebral ischemia: Secondary | ICD-10-CM | POA: Diagnosis not present

## 2023-06-24 DIAGNOSIS — G894 Chronic pain syndrome: Secondary | ICD-10-CM | POA: Diagnosis not present

## 2023-06-24 DIAGNOSIS — R29707 NIHSS score 7: Secondary | ICD-10-CM | POA: Diagnosis not present

## 2023-06-24 DIAGNOSIS — R471 Dysarthria and anarthria: Secondary | ICD-10-CM | POA: Diagnosis present

## 2023-06-24 DIAGNOSIS — Z87891 Personal history of nicotine dependence: Secondary | ICD-10-CM

## 2023-06-24 DIAGNOSIS — I890 Lymphedema, not elsewhere classified: Secondary | ICD-10-CM | POA: Diagnosis present

## 2023-06-24 DIAGNOSIS — J984 Other disorders of lung: Secondary | ICD-10-CM | POA: Diagnosis not present

## 2023-06-24 DIAGNOSIS — R29709 NIHSS score 9: Secondary | ICD-10-CM | POA: Diagnosis present

## 2023-06-24 DIAGNOSIS — J449 Chronic obstructive pulmonary disease, unspecified: Secondary | ICD-10-CM | POA: Diagnosis present

## 2023-06-24 DIAGNOSIS — Z953 Presence of xenogenic heart valve: Secondary | ICD-10-CM

## 2023-06-24 DIAGNOSIS — F411 Generalized anxiety disorder: Secondary | ICD-10-CM | POA: Diagnosis present

## 2023-06-24 DIAGNOSIS — F4312 Post-traumatic stress disorder, chronic: Secondary | ICD-10-CM | POA: Diagnosis present

## 2023-06-24 DIAGNOSIS — Z79899 Other long term (current) drug therapy: Secondary | ICD-10-CM

## 2023-06-24 DIAGNOSIS — Z7901 Long term (current) use of anticoagulants: Secondary | ICD-10-CM

## 2023-06-24 DIAGNOSIS — M542 Cervicalgia: Secondary | ICD-10-CM | POA: Diagnosis present

## 2023-06-24 DIAGNOSIS — I440 Atrioventricular block, first degree: Secondary | ICD-10-CM | POA: Diagnosis not present

## 2023-06-24 DIAGNOSIS — E1169 Type 2 diabetes mellitus with other specified complication: Secondary | ICD-10-CM | POA: Diagnosis not present

## 2023-06-24 LAB — POCT I-STAT, CHEM 8
BUN: 44 mg/dL — ABNORMAL HIGH (ref 8–23)
Calcium, Ion: 1.04 mmol/L — ABNORMAL LOW (ref 1.15–1.40)
Chloride: 103 mmol/L (ref 98–111)
Creatinine, Ser: 1.3 mg/dL — ABNORMAL HIGH (ref 0.61–1.24)
Glucose, Bld: 80 mg/dL (ref 70–99)
HCT: 44 % (ref 39.0–52.0)
Hemoglobin: 15 g/dL (ref 13.0–17.0)
Potassium: 5.3 mmol/L — ABNORMAL HIGH (ref 3.5–5.1)
Sodium: 139 mmol/L (ref 135–145)
TCO2: 29 mmol/L (ref 22–32)

## 2023-06-24 LAB — DIFFERENTIAL
Abs Immature Granulocytes: 0.07 10*3/uL (ref 0.00–0.07)
Basophils Absolute: 0.1 10*3/uL (ref 0.0–0.1)
Basophils Relative: 1 %
Eosinophils Absolute: 0.5 10*3/uL (ref 0.0–0.5)
Eosinophils Relative: 5 %
Immature Granulocytes: 1 %
Lymphocytes Relative: 20 %
Lymphs Abs: 1.8 10*3/uL (ref 0.7–4.0)
Monocytes Absolute: 0.8 10*3/uL (ref 0.1–1.0)
Monocytes Relative: 9 %
Neutro Abs: 5.8 10*3/uL (ref 1.7–7.7)
Neutrophils Relative %: 64 %

## 2023-06-24 LAB — RAPID URINE DRUG SCREEN, HOSP PERFORMED
Amphetamines: NOT DETECTED
Barbiturates: NOT DETECTED
Benzodiazepines: NOT DETECTED
Cocaine: NOT DETECTED
Opiates: NOT DETECTED
Tetrahydrocannabinol: NOT DETECTED

## 2023-06-24 LAB — CBC
HCT: 43.8 % (ref 39.0–52.0)
Hemoglobin: 14 g/dL (ref 13.0–17.0)
MCH: 28.9 pg (ref 26.0–34.0)
MCHC: 32 g/dL (ref 30.0–36.0)
MCV: 90.5 fL (ref 80.0–100.0)
Platelets: 265 10*3/uL (ref 150–400)
RBC: 4.84 MIL/uL (ref 4.22–5.81)
RDW: 17.3 % — ABNORMAL HIGH (ref 11.5–15.5)
WBC: 9 10*3/uL (ref 4.0–10.5)
nRBC: 0 % (ref 0.0–0.2)

## 2023-06-24 LAB — COMPREHENSIVE METABOLIC PANEL
ALT: 23 U/L (ref 0–44)
AST: 30 U/L (ref 15–41)
Albumin: 3.7 g/dL (ref 3.5–5.0)
Alkaline Phosphatase: 56 U/L (ref 38–126)
Anion gap: 12 (ref 5–15)
BUN: 29 mg/dL — ABNORMAL HIGH (ref 8–23)
CO2: 25 mmol/L (ref 22–32)
Calcium: 9.6 mg/dL (ref 8.9–10.3)
Chloride: 102 mmol/L (ref 98–111)
Creatinine, Ser: 1.18 mg/dL (ref 0.61–1.24)
GFR, Estimated: >60 mL/min (ref >60)
Glucose, Bld: 82 mg/dL (ref 70–99)
Potassium: 4.3 mmol/L (ref 3.5–5.1)
Sodium: 139 mmol/L (ref 135–145)
Total Bilirubin: 1 mg/dL (ref <1.2)
Total Protein: 6.9 g/dL (ref 6.5–8.1)

## 2023-06-24 LAB — I-STAT ARTERIAL BLOOD GAS, ED
Acid-Base Excess: 2 mmol/L (ref 0.0–2.0)
Bicarbonate: 27 mmol/L (ref 20.0–28.0)
Calcium, Ion: 1.26 mmol/L (ref 1.15–1.40)
HCT: 39 % (ref 39.0–52.0)
Hemoglobin: 13.3 g/dL (ref 13.0–17.0)
O2 Saturation: 92 %
Patient temperature: 97.6
Potassium: 3.1 mmol/L — ABNORMAL LOW (ref 3.5–5.1)
Sodium: 140 mmol/L (ref 135–145)
TCO2: 28 mmol/L (ref 22–32)
pCO2 arterial: 42.5 mm[Hg] (ref 32–48)
pH, Arterial: 7.408 (ref 7.35–7.45)
pO2, Arterial: 61 mm[Hg] — ABNORMAL LOW (ref 83–108)

## 2023-06-24 LAB — URINALYSIS, ROUTINE W REFLEX MICROSCOPIC
Bilirubin Urine: NEGATIVE
Glucose, UA: 150 mg/dL — AB
Hgb urine dipstick: NEGATIVE
Ketones, ur: NEGATIVE mg/dL
Leukocytes,Ua: NEGATIVE
Nitrite: NEGATIVE
Protein, ur: NEGATIVE mg/dL
Specific Gravity, Urine: 1.009 (ref 1.005–1.030)
pH: 6 (ref 5.0–8.0)

## 2023-06-24 LAB — PROTIME-INR
INR: 1 (ref 0.8–1.2)
Prothrombin Time: 13.1 s (ref 11.4–15.2)

## 2023-06-24 LAB — ETHANOL: Alcohol, Ethyl (B): <10 mg/dL (ref <10)

## 2023-06-24 LAB — APTT: aPTT: 29 s (ref 24–36)

## 2023-06-24 MED ORDER — IOHEXOL 350 MG/ML SOLN
75.0000 mL | Freq: Once | INTRAVENOUS | Status: AC | PRN
  Administered 2023-06-24: 75 mL via INTRAVENOUS

## 2023-06-24 MED ORDER — LORAZEPAM 2 MG/ML IJ SOLN
0.5000 mg | Freq: Once | INTRAMUSCULAR | Status: AC | PRN
  Administered 2023-06-24: 0.5 mg via INTRAVENOUS
  Filled 2023-06-24: qty 1

## 2023-06-24 NOTE — Consult Note (Signed)
NEUROLOGY CONSULT NOTE   Date of service: June 24, 2023 Patient Name: Lawrence [name] MRN:  [id number] DOB:  Dec 17, 1955 Chief Complaint: "Strokelike symptoms-code stroke activated for left-sided weakness and decreased level of consciousness" Requesting Provider: Tegeler, Canary Brim, *  History of Present Illness  Lawrence [name] is a 67 y.o. male  has a past medical history of Acquired dilation of ascending aorta and aortic root (HCC), Adenomatous colon polyp (2007), Anemia, Anxiety, Asthma, BPH without obstruction/lower urinary tract symptoms (02/22/2017), Chronic diastolic (congestive) heart failure (HCC), Chronic venous stasis (03/07/2019), COPD (chronic obstructive pulmonary disease) (HCC), Coronary artery calcification seen on CAT scan, Depression, Diabetic neuropathy (HCC) (09/11/2019), History of colon polyps (08/24/2018), Hypertension, Morbid obesity (HCC), OSA (obstructive sleep apnea), Pain due to onychomycosis of toenails of both feet (09/11/2019), Peripheral neuropathy (02/22/2017), Primary osteoarthritis, left shoulder (03/05/2017), PTSD (post-traumatic stress disorder), Pure hypercholesterolemia, QT prolongation (03/07/2019), S/P TAVR (transcatheter aortic valve replacement) (02/07/2023), Seizures (HCC), Severe aortic stenosis, Sinus tachycardia (03/07/2019), Sleep apnea, and Type 2 diabetes mellitus with vascular disease (HCC) (09/11/2019).   Has residual left-sided weakness from prior strokes, brought in by EMS when they were called to evaluate him for unresponsiveness.  Last known well was 5:30 PM.  Patient was found unresponsive on his recliner.  He had to be sternal rubbed before he came around.  No witnessed seizure activity.  On coming around, it was noted that he was weak on the left side and had slurred speech and was slow to respond to questions.  Given the fact that he was on Eliquis and had prior strokes, code stroke was activated and he was brought in by emergency traffic  to the ER. Evaluated at the bridge.  See detailed exam below.  Evaluated later in the room-had some neck shaking but was awake during that episode-it almost looks like he is volitionally shaking his head to say no with increasing frequency.  No postictal state following this.  No tongue biting.  No urinary incontinence  LKW: 5:30 PM Modified rankin score: 4-Needs assistance to walk and tend to bodily needs IV Thrombolysis: No-on Eliquis EVT: No ELVO NIHSS 9   ROS  Comprehensive ROS performed and pertinent positives documented in HPI   Past History   Past Medical History:  Diagnosis Date   Acquired dilation of ascending aorta and aortic root (HCC)    40mm by echo 01/2021   Adenomatous colon polyp 2007   Anemia    Anxiety    Asthma    BPH without obstruction/lower urinary tract symptoms 02/22/2017   Chronic diastolic (congestive) heart failure (HCC)    Chronic venous stasis 03/07/2019   COPD (chronic obstructive pulmonary disease) (HCC)    Coronary artery calcification seen on CAT scan    Depression    Diabetic neuropathy (HCC) 09/11/2019   History of colon polyps 08/24/2018   Hypertension    Morbid obesity (HCC)    OSA (obstructive sleep apnea)    Pain due to onychomycosis of toenails of both feet 09/11/2019   Peripheral neuropathy 02/22/2017   Primary osteoarthritis, left shoulder 03/05/2017   PTSD (post-traumatic stress disorder)    Pure hypercholesterolemia    QT prolongation 03/07/2019   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 Lynnette Caffey and Dr. Laneta Simmers   Seizures [facility])    Severe aortic stenosis    Sinus tachycardia 03/07/2019   Sleep apnea    CPAP   Type 2 diabetes mellitus with  vascular disease (HCC) 09/11/2019    Past Surgical History:  Procedure Laterality Date   ENDOVENOUS ABLATION SAPHENOUS VEIN W/ LASER Right 08/20/2020   endovenous laser ablation right greater saphenous vein by Cari Caraway MD     ENDOVENOUS ABLATION SAPHENOUS VEIN W/ LASER Left 11/16/2022   endovenous laser ablation left greater saphenous vein by Cari Caraway MD   INTRAOPERATIVE TRANSTHORACIC ECHOCARDIOGRAM N/A 02/07/2023   Procedure: INTRAOPERATIVE TRANSTHORACIC ECHOCARDIOGRAM;  Surgeon: Orbie Pyo, MD;  Location: [facility] INVASIVE CV LAB;  Service: Open Heart Surgery;  Laterality: N/A;   JOINT REPLACEMENT     left knee replacement x 2   KNEE ARTHROSCOPY Bilateral    LEFT HEART CATH AND CORONARY ANGIOGRAPHY N/A 01/17/2018   Procedure: LEFT HEART CATH AND CORONARY ANGIOGRAPHY;  Surgeon: Marykay Lex, MD;  Location: [facility] INVASIVE CV LAB;  Service: Cardiovascular;  Laterality: N/A;   PRESSURE SENSOR/CARDIOMEMS N/A 08/26/2021   Procedure: PRESSURE SENSOR/CARDIOMEMS;  Surgeon: Laurey Morale, MD;  Location: [facility] INVASIVE CV LAB;  Service: Cardiovascular;  Laterality: N/A;   RIGHT HEART CATH N/A 08/26/2021   Procedure: RIGHT HEART CATH;  Surgeon: Laurey Morale, MD;  Location: [facility] INVASIVE CV LAB;  Service: Cardiovascular;  Laterality: N/A;   RIGHT HEART CATH AND CORONARY ANGIOGRAPHY N/A 01/26/2023   Procedure: RIGHT HEART CATH AND CORONARY ANGIOGRAPHY;  Surgeon: Orbie Pyo, MD;  Location: [facility] INVASIVE CV LAB;  Service: Cardiovascular;  Laterality: N/A;   TEE WITHOUT CARDIOVERSION N/A 08/26/2021   Procedure: TRANSESOPHAGEAL ECHOCARDIOGRAM (TEE);  Surgeon: Laurey Morale, MD;  Location: [facility] ENDOSCOPY;  Service: Cardiovascular;  Laterality: N/A;   TOOTH EXTRACTION N/A 02/03/2023   Procedure: DENTAL RESTORATION/EXTRACTIONS;  Surgeon: [name], DMD;  Location: [facility] OR;  Service: Oral Surgery;  Laterality: N/A;   TRANSCATHETER AORTIC VALVE REPLACEMENT, TRANSFEMORAL Right 02/07/2023   Procedure: Transcatheter Aortic Valve Replacement, Transfemoral;  Surgeon: Orbie Pyo, MD;  Location: [facility] INVASIVE CV LAB;  Service: Open Heart Surgery;  Laterality: Right;   UMBILICAL HERNIA REPAIR      Family History: Family  History  Problem Relation Age of Onset   CAD Maternal Grandfather    Diabetes Other    Diabetes Mellitus II Neg Hx    Colon cancer Neg Hx    Esophageal cancer Neg Hx    Inflammatory bowel disease Neg Hx    Liver disease Neg Hx    Pancreatic cancer Neg Hx    Rectal cancer Neg Hx    Stomach cancer Neg Hx    Sleep apnea Neg Hx     Social History  reports that he quit smoking about 7 years ago. His smoking use included cigarettes. He started smoking about 51 years ago. He has a 44 pack-year smoking history. He has been exposed to tobacco smoke. He has never used smokeless tobacco. He reports current alcohol use. He reports that he does not use drugs.  Allergies  Allergen Reactions   Vancomycin Other (See Comments)    "Red Man Syndrome" 02/02/17: possible cause for rash under both arms   Niacin And Related Other (See Comments)    Red man syndrome   Tubersol [Tuberculin, Ppd] Other (See Comments)    Reaction unknown   Doxycycline Rash and Other (See Comments)    Medications  No current facility-administered medications for this encounter.  Current Outpatient Medications:    acetaminophen (TYLENOL) 650 MG CR tablet, Take 1,300 mg by mouth every 8 (eight) hours as needed for pain., Disp: , Rfl:  albuterol (VENTOLIN HFA) 108 (90 Base) MCG/ACT inhaler, Inhale 2 puffs into the lungs every 6 (six) hours as needed for wheezing or shortness of breath., Disp: 1 each, Rfl: 6   Alcohol Swabs ([employer] PREP) 70 % PADS, USE AS DIRECTED, Disp: 200 each, Rfl: 0   allopurinol (ZYLOPRIM) 100 MG tablet, Take 200 mg by mouth 2 (two) times daily., Disp: , Rfl:    amoxicillin (AMOXIL) 500 MG tablet, Take 4 tablets (2,000 mg total) by mouth as directed. 1 hour prior to dental work including cleanings, Disp: 12 tablet, Rfl: 12   apixaban (ELIQUIS) 5 MG TABS tablet, Take 1 tablet (5 mg total) by mouth 2 (two) times daily., Disp: 180 tablet, Rfl: 3   ARIPiprazole (ABILIFY) 5 MG tablet, Take 1 tablet (5 mg  total) by mouth at bedtime., Disp: 60 tablet, Rfl: 0   atorvastatin (LIPITOR) 40 MG tablet, Take 1 tablet (40 mg total) by mouth daily., Disp: 90 tablet, Rfl: 3   busPIRone (BUSPAR) 10 MG tablet, Take 1 tablet (10 mg total) by mouth 3 (three) times daily., Disp: 270 tablet, Rfl: 0   cetirizine (ZYRTEC) 10 MG tablet, Take 10 mg by mouth at bedtime. , Disp: , Rfl:    Cholecalciferol (VITAMIN D3 PO), Take 2,000 Units by mouth daily., Disp: , Rfl:    dapagliflozin propanediol (FARXIGA) 10 MG TABS tablet, TAKE ONE TABLET BY MOUTH BEFORE BREAKFAST (Patient taking differently: Take 10 mg by mouth daily.), Disp: 90 tablet, Rfl: 3   diclofenac Sodium (VOLTAREN) 1 % GEL, Apply 4 g topically 4 (four) times daily. To right knee., Disp: 100 g, Rfl: 0   fenofibrate (TRICOR) 145 MG tablet, Take 1 tablet (145 mg total) by mouth daily., Disp: 90 tablet, Rfl: 3   ferrous sulfate 324 (65 Fe) MG TBEC, Take 324 mg by mouth daily with breakfast. , Disp: , Rfl:    fluticasone (FLONASE) 50 MCG/ACT nasal spray, Place 2 sprays into both nostrils daily. , Disp: , Rfl:    Fluticasone-Umeclidin-Vilant (TRELEGY ELLIPTA) 200-62.5-25 MCG/ACT AEPB, INHALE 1 PUFF BY MOUTH INTO LUNGS ONCE DAILY, Disp: 60 each, Rfl: 11   icosapent Ethyl (VASCEPA) 1 g capsule, Take 2 capsules (2 g total) by mouth 2 (two) times daily., Disp: 120 capsule, Rfl: 3   insulin aspart (NOVOLOG FLEXPEN) 100 UNIT/ML FlexPen, INJECT 12 UNITS BEFORE MEALS, keep sugar TWO HOURS AFTER meals UNDER 180 AT least, Disp: 15 mL, Rfl: 3   insulin degludec (TRESIBA FLEXTOUCH) 100 UNIT/ML FlexTouch Pen, INJECT 40 UNITS INTO THE SKIN ONCE DAILY, Disp: 10 mL, Rfl: 0   Insulin Pen Needle (B-D ULTRAFINE III SHORT PEN) 31G X 8 MM MISC, Inject 1 each into the skin 3 (three) times daily before meals. Use as instructed 4X daily., Disp: 100 each, Rfl: 5   Lacosamide 150 MG TABS, Take 1 tablet (150 mg total) by mouth 2 (two) times daily., Disp: 180 tablet, Rfl: 3   levETIRAcetam  (KEPPRA) 500 MG tablet, Take 1 tablet (500 mg total) by mouth 2 (two) times daily., Disp: 180 tablet, Rfl: 3   metFORMIN (GLUCOPHAGE-XR) 500 MG 24 hr tablet, Take 4 tablets (2,000 mg total) by mouth daily., Disp: 360 tablet, Rfl: 3   metoprolol succinate (TOPROL-XL) 50 MG 24 hr tablet, Take 1 tablet (50 mg total) by mouth daily., Disp: 90 tablet, Rfl: 3   mirtazapine (REMERON) 15 MG tablet, Take 1 tablet (15 mg total) by mouth at bedtime., Disp: 30 tablet, Rfl: 0   omeprazole (PRILOSEC)  20 MG capsule, Take 20 mg by mouth every morning., Disp: , Rfl:    potassium chloride SA (KLOR-CON M) 20 MEQ tablet, TAKE (2 TABLETS) IN THE MORNING, TAKE (1 TABLET) IN THE EVENING, Disp: 90 tablet, Rfl: 3   prazosin (MINIPRESS) 2 MG capsule, Take 2 capsules (4 mg total) by mouth at bedtime. (Patient taking differently: Take 2 mg by mouth at bedtime.), Disp: 60 capsule, Rfl: 2   pregabalin (LYRICA) 150 MG capsule, Take 1 capsule (150 mg total) by mouth 3 (three) times daily., Disp: 270 capsule, Rfl: 3   repaglinide (PRANDIN) 2 MG tablet, TAKE TWO TABLETS BY MOUTH TWICE DAILY BEFORE A meal (Patient taking differently: Take 2 mg by mouth 2 (two) times daily before a meal.), Disp: 120 tablet, Rfl: 0   spironolactone (ALDACTONE) 25 MG tablet, Take 1 tablet (25 mg total) by mouth daily., Disp: 30 tablet, Rfl: 3   tirzepatide (MOUNJARO) 15 MG/0.5ML Pen, INJECT 15 MG INTO THE SKIN ONCE A WEEK., Disp: 6 mL, Rfl: 0   torsemide (DEMADEX) 20 MG tablet, Take 6 tablets (120 mg total) by mouth in the morning and at bedtime., Disp: 1080 tablet, Rfl: 3   traZODone (DESYREL) 100 MG tablet, TAKE 2 TABLETS BY MOUTH AT BEDTIME., Disp: 120 tablet, Rfl: 0   venlafaxine XR (EFFEXOR-XR) 75 MG 24 hr capsule, Take 3 capsules (225 mg total) by mouth daily with breakfast., Disp: 270 capsule, Rfl: 0  Vitals   Vitals:   July 08, 2023 2100 2023/07/08 2111  Weight: (!) 169.2 kg (!) 169.2 kg  Height:  6\' 2"  (1.88 m)    Body mass index is  47.89 kg/m.  Physical Exam  General: Awake but a little lethargic, no apparent distress HEENT: Normocephalic atraumatic Lungs: Clear Cardiovascular: Regular rhythm Abdomen nondistended nontender Extremities: Extremely edematous with open ulcers from venous stasis and redness extending up to mid calf. Neurological exam Awake but somewhat lethargic in no apparent distress Mildly dysarthric speech No evidence of aphasia Diminished attention concentration Cranial nerves: Pupils equal round react light, extract movements intact, visual fields full, facial sensation diminished on the left, left lower facial weakness at rest and on smiling, tongue and palate midline. Motor examination with drift in the left upper extremity.  No drift in the right upper extremity.  He is able to lift the right lower extremity with a lot of discomfort in the ER for about 5 seconds with minimal drift.  Left lower extremity is not antigravity. Sensation diminished on the left hemibody Coordination examination with no dysmetria in the upper extremities, unable to test in the lower extremities. NIH stroke scale 1a Level of Conscious.: 0 1b LOC Questions: 0 1c LOC Commands: 0 2 Best Gaze: 0 3 Visual: 0 4 Facial Palsy: 1 5a Motor Arm - left: 1 5b Motor Arm - Right: 0 6a Motor Leg - Left: 1 6b Motor Leg - Right: 3 7 Limb Ataxia: 0 8 Sensory: 2 9 Best Language: 0 10 Dysarthria: 1 11 Extinct. and Inatten.: 0 TOTAL: 9  Labs/Imaging/Neurodiagnostic studies   CBC:  Recent Labs  Lab 2023-07-08 2107 08-Jul-2023 2109  WBC  --  9.0  NEUTROABS  --  5.8  HGB 15.0 14.0  HCT 44.0 43.8  MCV  --  90.5  PLT  --  265   Basic Metabolic Panel:  Lab Results  Component Value Date   NA 139 2023-07-08   K 5.3 (H) 07-08-2023   CO2 29 06/16/2023   GLUCOSE 80 07/08/23  BUN 44 (H) 06/24/2023   CREATININE 1.30 (H) 06/24/2023   CALCIUM 9.2 06/16/2023   GFRNONAA >60 06/16/2023   GFRAA 77 05/20/2020   Lipid Panel:   Lab Results  Component Value Date   LDLCALC 60 12/05/2022   HgbA1c:  Lab Results  Component Value Date   HGBA1C 6.4 (A) 05/25/2023   Urine Drug Screen:     Component Value Date/Time   LABOPIA NONE DETECTED 05/07/2023 0511   COCAINSCRNUR NONE DETECTED 05/07/2023 0511   LABBENZ NONE DETECTED 05/07/2023 0511   AMPHETMU NONE DETECTED 05/07/2023 0511   THCU NONE DETECTED 05/07/2023 0511   LABBARB NONE DETECTED 05/07/2023 0511    Alcohol Level     Component Value Date/Time   ETH <10 05/07/2023 0051   INR  Lab Results  Component Value Date   INR 1.0 05/07/2023   APTT  Lab Results  Component Value Date   APTT 29 05/07/2023   AED levels:  Lab Results  Component Value Date   LEVETIRACETA 23.2 09/10/2021   CT Head without contrast(Personally reviewed): Aspects 10.  CT angio Head and Neck with contrast(Personally reviewed): No ELVO.  ASSESSMENT   Lawrence [name] is a 67 y.o. male  has a past medical history of Acquired dilation of ascending aorta and aortic root (HCC), Adenomatous colon polyp (2007), Anemia, Anxiety, Asthma, BPH without obstruction/lower urinary tract symptoms (02/22/2017), Chronic diastolic (congestive) heart failure (HCC), Chronic venous stasis (03/07/2019), COPD (chronic obstructive pulmonary disease) (HCC), Coronary artery calcification seen on CAT scan, Depression, Diabetic neuropathy (HCC) (09/11/2019), History of colon polyps (08/24/2018), Hypertension, Morbid obesity (HCC), OSA (obstructive sleep apnea), Pain due to onychomycosis of toenails of both feet (09/11/2019), Peripheral neuropathy (02/22/2017), Primary osteoarthritis, left shoulder (03/05/2017), PTSD (post-traumatic stress disorder), Pure hypercholesterolemia, QT prolongation (03/07/2019), S/P TAVR (transcatheter aortic valve replacement) (02/07/2023), Seizures (HCC), Severe aortic stenosis, Sinus tachycardia (03/07/2019), Sleep apnea, and Type 2 diabetes mellitus with vascular disease (HCC)  (09/11/2019).  Baseline left-sided weakness from prior strokes presenting for evaluation of decreased level of consciousness and worsening left-sided weakness. On my examination, he is a little bit lethargic but still exhibits left-sided hemiparesis which is his known deficit from prior strokes. At this point, he could be having recrudescence of old stroke symptoms versus a new stroke. Also has a history of seizures although nothing was witnessed today, could have been an unwitnessed seizure.  Other possibility is his sleep apnea causing hypercarbia and narcosis.  Needs further workup to rule out any infectious cause and an MRI to rule out a new stroke.  Unless either of them are concerning, do not see a need for admission   RECOMMENDATIONS  MRI brain without contrast UA, chest x-ray Check ABG Maintain seizure precautions Every hour neurochecks for the next couple of hours and then can change to every 2 hour neurochecks. Further recommendations based on clinical course and above test Plan discussed with Dr. Rush Landmark ______________________________________________________________________   Signed, Milon Dikes, MD [facility]

## 2023-06-24 NOTE — Code Documentation (Signed)
Stroke Response Nurse Documentation Code Documentation  EDYN NORDELL is a 67 y.o. male arriving to [facility]  via [facility] on 11/16 with past medical hx of HTN, OSA with CPAP, CHF, COPD, CVA with left sided UE weakness, HLD and seizures. On Eliquis (apixaban) daily. Code stroke was activated by EMS.   Patient from home where he was LKW at 1730 and now complaining of left sided weakness and slurred speech.   Stroke team at the bedside on patient arrival. Labs drawn and patient cleared for CT by Dr. Emeline General. Patient to CT with team. NIHSS 7, see documentation for details and code stroke times. Patient with left facial droop, left arm weakness, left leg weakness, left decreased sensation, and dysarthria  on exam. The following imaging was completed:  CT Head and CTA. Patient is not a candidate for IV Thrombolytic due to Elliquis. Patient is not a candidate for IR due to No LVO.   Care Plan: Neuro checks q1 hr x2, then q2 hrs.   Bedside handoff with ED RN Raynelle Fanning.    Rose Fillers  Rapid Response RN

## 2023-06-24 NOTE — ED Triage Notes (Signed)
Pt c/o left sided weakness, last known well at 17:30. Per EMS pt was hard to wake up after going to sleep in the recliner, upon waking pt had slurred speech, left side facial droop and feeling "fuzzy" headed. Pt took his Eliquis at 1.Pt has a h/o seizures.

## 2023-06-24 NOTE — ED Provider Notes (Signed)
[place] EMERGENCY DEPARTMENT AT [facility] Provider Note   CSN: [id number] Arrival date & time: 06/24/23  2108  An emergency department physician performed an initial assessment on this suspected stroke patient at 2055.  History  Chief Complaint  Patient presents with   Weakness    Lawrence [name] is a 67 y.o. male.  The history is provided by the patient and medical records. No language interpreter was used.  Weakness Severity:  Severe Onset quality:  Gradual Duration: last normal 5:30. Timing:  Unable to specify Relieved by:  Nothing Worsened by:  Nothing Ineffective treatments:  None tried Associated symptoms: aphasia and stroke symptoms   Associated symptoms: no abdominal pain, no chest pain, no cough, no diarrhea, no dysuria, no numbness in extremities, no falls, no fever, no foul-smelling urine, no headaches, no loss of consciousness, no nausea, no near-syncope, no shortness of breath and no vision change        Home Medications Prior to Admission medications   Medication Sig Start Date End Date Taking? Authorizing Provider  acetaminophen (TYLENOL) 650 MG CR tablet Take 1,300 mg by mouth every 8 (eight) hours as needed for pain.    [provider]  albuterol (VENTOLIN HFA) 108 (90 Base) MCG/ACT inhaler Inhale 2 puffs into the lungs every 6 (six) hours as needed for wheezing or shortness of breath. 04/17/23   Hunsucker, Lesia Sago, MD  Alcohol Swabs ([facility] PREP) 70 % PADS USE AS DIRECTED 05/11/23   Altamese Westfield, MD  allopurinol (ZYLOPRIM) 100 MG tablet Take 200 mg by mouth 2 (two) times daily.    [provider]  amoxicillin (AMOXIL) 500 MG tablet Take 4 tablets (2,000 mg total) by mouth as directed. 1 hour prior to dental work including cleanings 02/15/23   Janetta Hora, PA-C  apixaban (ELIQUIS) 5 MG TABS tablet Take 1 tablet (5 mg total) by mouth 2 (two) times daily. 04/17/23   Laurey Morale, MD  ARIPiprazole (ABILIFY) 5 MG tablet  Take 1 tablet (5 mg total) by mouth at bedtime. 06/08/23   Mozingo, Thereasa Solo, NP  atorvastatin (LIPITOR) 40 MG tablet Take 1 tablet (40 mg total) by mouth daily. 04/17/23   Laurey Morale, MD  busPIRone (BUSPAR) 10 MG tablet Take 1 tablet (10 mg total) by mouth 3 (three) times daily. 04/17/23   Mozingo, Thereasa Solo, NP  cetirizine (ZYRTEC) 10 MG tablet Take 10 mg by mouth at bedtime.     [provider]  Cholecalciferol (VITAMIN D3 PO) Take 2,000 Units by mouth daily.    [provider]  dapagliflozin propanediol (FARXIGA) 10 MG TABS tablet TAKE ONE TABLET BY MOUTH BEFORE BREAKFAST Patient taking differently: Take 10 mg by mouth daily. 04/17/23   Laurey Morale, MD  diclofenac Sodium (VOLTAREN) 1 % GEL Apply 4 g topically 4 (four) times daily. To right knee. 10/31/22   Arrien, [place] Ram, MD  fenofibrate (TRICOR) 145 MG tablet Take 1 tablet (145 mg total) by mouth daily. 04/17/23   Laurey Morale, MD  ferrous sulfate 324 (65 Fe) MG TBEC Take 324 mg by mouth daily with breakfast.     [provider]  fluticasone (FLONASE) 50 MCG/ACT nasal spray Place 2 sprays into both nostrils daily.     [provider]  Fluticasone-Umeclidin-Vilant (TRELEGY ELLIPTA) 200-62.5-25 MCG/ACT AEPB INHALE 1 PUFF BY MOUTH INTO LUNGS ONCE DAILY 08/18/22   Hunsucker, Lesia Sago, MD  icosapent Ethyl (VASCEPA) 1 g capsule Take 2 capsules (  2 g total) by mouth 2 (two) times daily. 04/17/23   Laurey Morale, MD  insulin aspart (NOVOLOG FLEXPEN) 100 UNIT/ML FlexPen INJECT 12 UNITS BEFORE MEALS, keep sugar TWO HOURS AFTER meals UNDER 180 AT least 04/17/23   Motwani, Komal, MD  insulin degludec (TRESIBA FLEXTOUCH) 100 UNIT/ML FlexTouch Pen INJECT 40 UNITS INTO THE SKIN ONCE DAILY 06/07/23   Motwani, Komal, MD  Insulin Pen Needle (B-D ULTRAFINE III SHORT PEN) 31G X 8 MM MISC Inject 1 each into the skin 3 (three) times daily before meals. Use as instructed 4X daily. 04/17/23   Altamese Avon, MD   Lacosamide 150 MG TABS Take 1 tablet (150 mg total) by mouth 2 (two) times daily. 05/04/23   Windell Norfolk, MD  levETIRAcetam (KEPPRA) 500 MG tablet Take 1 tablet (500 mg total) by mouth 2 (two) times daily. 05/04/23   Windell Norfolk, MD  metFORMIN (GLUCOPHAGE-XR) 500 MG 24 hr tablet Take 4 tablets (2,000 mg total) by mouth daily. 04/17/23   Altamese Audubon, MD  metoprolol succinate (TOPROL-XL) 50 MG 24 hr tablet Take 1 tablet (50 mg total) by mouth daily. 04/17/23   Laurey Morale, MD  mirtazapine (REMERON) 15 MG tablet Take 1 tablet (15 mg total) by mouth at bedtime. 04/17/23   Mozingo, Thereasa Solo, NP  omeprazole (PRILOSEC) 20 MG capsule Take 20 mg by mouth every morning. 05/29/20   [provider]  potassium chloride SA (KLOR-CON M) 20 MEQ tablet TAKE (2 TABLETS) IN THE MORNING, TAKE (1 TABLET) IN THE EVENING 06/09/23   Milford, Anderson Malta, FNP  prazosin (MINIPRESS) 2 MG capsule Take 2 capsules (4 mg total) by mouth at bedtime. Patient taking differently: Take 2 mg by mouth at bedtime. 05/14/23   Mozingo, Thereasa Solo, NP  pregabalin (LYRICA) 150 MG capsule Take 1 capsule (150 mg total) by mouth 3 (three) times daily. 05/04/23   Windell Norfolk, MD  repaglinide (PRANDIN) 2 MG tablet TAKE TWO TABLETS BY MOUTH TWICE DAILY BEFORE A meal Patient taking differently: Take 2 mg by mouth 2 (two) times daily before a meal. 10/04/22   Reather Littler, MD  spironolactone (ALDACTONE) 25 MG tablet Take 1 tablet (25 mg total) by mouth daily. 06/09/23   Milford, Anderson Malta, FNP  tirzepatide [facility]) 15 MG/0.5ML Pen INJECT 15 MG INTO THE SKIN ONCE A WEEK. 06/07/23   Motwani, Carin Hock, MD  torsemide (DEMADEX) 20 MG tablet Take 6 tablets (120 mg total) by mouth in the morning and at bedtime. 04/17/23   Laurey Morale, MD  traZODone (DESYREL) 100 MG tablet TAKE 2 TABLETS BY MOUTH AT BEDTIME. 06/08/23   Mozingo, Thereasa Solo, NP  venlafaxine XR (EFFEXOR-XR) 75 MG 24 hr capsule Take 3 capsules (225 mg  total) by mouth daily with breakfast. 04/17/23   Mozingo, Thereasa Solo, NP      Allergies    Vancomycin; Niacin and related; Tubersol [tuberculin, ppd]; and Doxycycline    Review of Systems   Review of Systems  Constitutional:  Positive for fatigue. Negative for chills, diaphoresis and fever.  HENT:  Negative for congestion.   Eyes:  Negative for visual disturbance.  Respiratory:  Negative for cough, chest tightness, shortness of breath and wheezing.   Cardiovascular:  Negative for chest pain and near-syncope.  Gastrointestinal:  Negative for abdominal pain, diarrhea and nausea.  Genitourinary:  Negative for dysuria and flank pain.  Musculoskeletal:  Negative for back pain and falls.  Skin:  Negative for rash.  Neurological:  Positive for speech difficulty and weakness. Negative for loss of consciousness, light-headedness, numbness and headaches.  Psychiatric/Behavioral:  Negative for agitation.   All other systems reviewed and are negative.   Physical Exam Updated Vital Signs BP (!) 113/51   Pulse 84   Temp 97.6 F (36.4 C) (Oral)   Resp (!) 21   Ht 6\' 2"  (1.88 m)   Wt (!) 169.2 kg   SpO2 94%   BMI 47.89 kg/m  Physical Exam Vitals and nursing note reviewed.  Constitutional:      General: He is not in acute distress.    Appearance: He is well-developed. He is not ill-appearing, toxic-appearing or diaphoretic.  HENT:     Head: Normocephalic and atraumatic.     Mouth/Throat:     Mouth: Mucous membranes are moist.  Eyes:     Extraocular Movements: Extraocular movements intact.     Conjunctiva/sclera: Conjunctivae normal.     Pupils: Pupils are equal, round, and reactive to light.  Cardiovascular:     Rate and Rhythm: Normal rate and regular rhythm.     Heart sounds: No murmur heard. Pulmonary:     Effort: Pulmonary effort is normal. No respiratory distress.     Breath sounds: Normal breath sounds. No wheezing, rhonchi or rales.  Chest:     Chest wall: No  tenderness.  Abdominal:     General: Abdomen is flat.     Palpations: Abdomen is soft.     Tenderness: There is no abdominal tenderness. There is no guarding or rebound.  Musculoskeletal:        General: No swelling or tenderness.     Cervical back: Neck supple. No tenderness.  Skin:    General: Skin is warm and dry.     Capillary Refill: Capillary refill takes less than 2 seconds.     Findings: No erythema or rash.  Neurological:     Mental Status: He is alert.     GCS: GCS eye subscore is 4. GCS verbal subscore is 5. GCS motor subscore is 6.     Cranial Nerves: No facial asymmetry.     Sensory: No sensory deficit.     Motor: Weakness present.     Comments: Weakness to left arm and left leg.  On my initial evaluation had aphasia but gradually improved during ED stay.  Psychiatric:        Mood and Affect: Mood normal.     ED Results / Procedures / Treatments   Labs (all labs ordered are listed, but only abnormal results are displayed) Labs Reviewed  CBC - Abnormal; Notable for the following components:      Result Value   RDW 17.3 (*)    All other components within normal limits  COMPREHENSIVE METABOLIC PANEL - Abnormal; Notable for the following components:   BUN 29 (*)    All other components within normal limits  URINALYSIS, ROUTINE W REFLEX MICROSCOPIC - Abnormal; Notable for the following components:   Color, Urine COLORLESS (*)    Glucose, UA 150 (*)    All other components within normal limits  POCT I-STAT, CHEM 8 - Abnormal; Notable for the following components:   Potassium 5.3 (*)    BUN 44 (*)    Creatinine, Ser 1.30 (*)    Calcium, Ion 1.04 (*)    All other components within normal limits  I-STAT ARTERIAL BLOOD GAS, ED - Abnormal; Notable for the following components:   pO2, Arterial 61 (*)  Potassium 3.1 (*)    All other components within normal limits  ETHANOL  PROTIME-INR  APTT  DIFFERENTIAL  RAPID URINE DRUG SCREEN, HOSP PERFORMED  BLOOD GAS,  ARTERIAL  TSH  I-STAT CHEM 8, ED    EKG None  Radiology DG Chest Portable 1 View  Result Date: 06/24/2023 CLINICAL DATA:  Altered level of consciousness EXAM: PORTABLE CHEST 1 VIEW COMPARISON:  05/15/2023 FINDINGS: Single frontal view of the chest demonstrates a stable cardiac silhouette. Chronic scarring at the lung bases. No acute airspace disease, effusion, or pneumothorax. No acute bony abnormalities. IMPRESSION: 1. Chronic bibasilar scarring.  No acute airspace disease. Electronically Signed   By: Sharlet Salina M.D.   On: 06/24/2023 22:52   CT ANGIO HEAD NECK W WO CM  Result Date: 06/24/2023 CLINICAL DATA:  Stroke/TIA. Determine embolic source. Left-sided facial droop. EXAM: CT ANGIOGRAPHY HEAD AND NECK WITH AND WITHOUT CONTRAST TECHNIQUE: Multidetector CT imaging of the head and neck was performed using the standard protocol during bolus administration of intravenous contrast. Multiplanar CT image reconstructions and MIPs were obtained to evaluate the vascular anatomy. Carotid stenosis measurements (when applicable) are obtained utilizing NASCET criteria, using the distal internal carotid diameter as the denominator. RADIATION DOSE REDUCTION: This exam was performed according to the departmental dose-optimization program which includes automated exposure control, adjustment of the mA and/or kV according to patient size and/or use of iterative reconstruction technique. CONTRAST:  75mL OMNIPAQUE IOHEXOL 350 MG/ML SOLN COMPARISON:  CT head without contrast 06/24/2023. CT angio head and neck 05/07/2023. FINDINGS: CTA NECK FINDINGS Aortic arch: Atherosclerotic calcifications are present at the aortic arch without significant stenosis relative to the distal vessel. A scratched at the left common carotid artery shares a common origin with the innominate artery. Right carotid system: The right common carotid artery is within normal limits. Atherosclerotic calcifications are present at the bifurcation  without significant stenosis. Mild tortuosity is present in the cervical left ICA. Left carotid system: The left common carotid artery is within normal limits. Atherosclerotic calcifications are present at the bifurcation. No significant stenosis is present. Mild tortuosity is present the cervical left ICA. Vertebral arteries: The left vertebral artery is the dominant vessel. Both vertebral arteries originate from the subclavian arteries without significant stenosis. Significant stenosis is present either vertebral artery in. Skeleton: Neck level degenerative changes are again noted in the cervical spine. No focal osseous lesions are present. Other neck: Soft tissues the neck are otherwise unremarkable. Salivary glands are within normal limits. Thyroid is normal. No significant adenopathy is present. No focal mucosal or submucosal lesions are present. Upper chest: Mild dependent atelectasis is present in the lungs bilaterally. Review of the MIP images confirms the above findings CTA HEAD FINDINGS Anterior circulation: The internal carotid arteries are within normal limits from the skull base to the ICA termini. The A1 and M1 segments are normal. The anterior communicating artery is patent. MCA bifurcations are normal. The ACA and MCA branch vessels are within normal limits. No aneurysm is present. Posterior circulation: Left vertebral artery is dominant vessel. The PICA origins are visualized and normal. The vertebrobasilar junction and basilar artery are normal. The superior cerebellar arteries are patent. Both posterior cerebral arteries originate from basilar tip. PCA branch vessels are normal bilaterally. Venous sinuses: The dural sinuses are patent. The straight sinus and deep cerebral veins are intact. Cortical veins are within normal limits. No significant vascular malformation is evident. Anatomic variants: None Review of the MIP images confirms the above findings IMPRESSION:  1. No emergent large vessel  occlusion. 2. Atherosclerotic changes at the carotid bifurcations bilaterally without significant stenosis. 3. Mild tortuosity of the cervical internal carotid arteries bilaterally likely reflects chronic hypertension. 4. Degenerative changes in the cervical spine. 5.  Aortic Atherosclerosis (ICD10-I70.0). Electronically Signed   By: Marin Roberts M.D.   On: 06/24/2023 21:33   CT HEAD CODE STROKE WO CONTRAST  Result Date: 06/24/2023 CLINICAL DATA:  Code stroke.  Initial evaluation for acute stroke. EXAM: CT HEAD WITHOUT CONTRAST TECHNIQUE: Contiguous axial images were obtained from the base of the skull through the vertex without intravenous contrast. RADIATION DOSE REDUCTION: This exam was performed according to the departmental dose-optimization program which includes automated exposure control, adjustment of the mA and/or kV according to patient size and/or use of iterative reconstruction technique. COMPARISON:  Prior study from 05/07/2023 FINDINGS: Brain: Cerebral volume within normal limits. Mild chronic microvascular ischemic disease. No acute intracranial hemorrhage. No acute large vessel territory infarct. No mass lesion or midline shift. No hydrocephalus or extra-axial fluid collection. Vascular: No abnormal hyperdense vessel. Skull: Scalp soft tissues within normal limits.  Calvarium intact. Sinuses/Orbits: Globes and orbital soft tissues within normal limits. Mild mucosal thickening about the ethmoidal air cells. Paranasal sinuses are otherwise clear. No significant mastoid effusion. Other: None. ASPECTS [facility] Stroke Program Early CT Score) - Ganglionic level infarction (caudate, lentiform nuclei, internal capsule, insula, M1-M3 cortex): 7 - Supraganglionic infarction (M4-M6 cortex): 3 Total score (0-10 with 10 being normal): 10 IMPRESSION: 1. No acute intracranial abnormality. 2. Aspects is 10. 3. Mild chronic microvascular ischemic disease. These results were communicated to Dr. Wilford Corner at  9:14 pm on 06/24/2023 by text page via the [facility] messaging system. Electronically Signed   By: Rise Mu M.D.   On: 06/24/2023 21:15    Procedures Procedures    Medications Ordered in ED Medications  LORazepam (ATIVAN) injection 0.5 mg (has no administration in time range)  iohexol (OMNIPAQUE) 350 MG/ML injection 75 mL (75 mLs Intravenous Contrast Given 06/24/23 2119)    ED Course/ Medical Decision Making/ A&P                                 Medical Decision Making Amount and/or Complexity of Data Reviewed Labs: ordered. Radiology: ordered.  Risk Prescription drug management.    LD EPLIN is a 67 y.o. male with a past medical history significant for sleep apnea, CHF, CAD, atrial fibrillation on Eliquis therapy, previous stroke with left-sided deficits, diabetes, previous TAVR, anxiety, depression who presents as a code stroke.  According to EMS and patient, patient start taking a nap at about 5:30 PM and then when he woke up he was having slow speech and more left-sided weakness in his left arm and left leg.  He reportedly more sleepy and tired.  He was denying any pain in his head, neck, chest, or abdomen.  Denies any trauma.  On arrival patient quickly taken to CT scanner and airway was intact.  CT scan did not show subacute bleed or convincing evidence of acute stroke at this time.  On my further exam, patient is still having slow speech.  Lungs are clear and chest nontender.  Abdomen nontender.  He has weakness in his left arm and left leg that he reports is still worse than his baseline.  No numbness appreciated.  Symmetric smile.  Neurology recommended MRI and other workup look for metabolic issues.  On  my reassessment prior to transfer of care, patient speech is back to normal but he still has more weakness in his left arm and left leg.  He will get the MRI and will wait for reassessment.  If patient is back to baseline and workup reassuring, he might be  stable for discharge home.  Care transferred oncoming team to wait for MRI and reassessment.           Final Clinical Impression(s) / ED Diagnoses Final diagnoses:  Transient speech disturbance  Left-sided weakness    Clinical Impression: 1. Transient speech disturbance   2. Left-sided weakness     Disposition: Care transferred to oncoming team to wait for MRI results and reassessment.  The patient back to baseline, suspect he may be stable for discharge home.  This note was prepared with assistance of Conservation officer, historic buildings. Occasional wrong-word or sound-a-like substitutions may have occurred due to the inherent limitations of voice recognition software.     Eliab Closson, Canary Brim, MD 06/24/23 2350

## 2023-06-24 NOTE — ED Notes (Signed)
Report received from [facility]. Assumed care of pt at this time.

## 2023-06-24 NOTE — ED Notes (Signed)
Patient transported to MRI 

## 2023-06-25 DIAGNOSIS — I6781 Acute cerebrovascular insufficiency: Secondary | ICD-10-CM

## 2023-06-25 DIAGNOSIS — Z794 Long term (current) use of insulin: Secondary | ICD-10-CM | POA: Diagnosis not present

## 2023-06-25 DIAGNOSIS — I482 Chronic atrial fibrillation, unspecified: Secondary | ICD-10-CM

## 2023-06-25 DIAGNOSIS — I6389 Other cerebral infarction: Secondary | ICD-10-CM | POA: Diagnosis not present

## 2023-06-25 DIAGNOSIS — E876 Hypokalemia: Secondary | ICD-10-CM | POA: Diagnosis not present

## 2023-06-25 DIAGNOSIS — E1140 Type 2 diabetes mellitus with diabetic neuropathy, unspecified: Secondary | ICD-10-CM | POA: Diagnosis not present

## 2023-06-25 DIAGNOSIS — R479 Unspecified speech disturbances: Secondary | ICD-10-CM | POA: Diagnosis not present

## 2023-06-25 DIAGNOSIS — Z66 Do not resuscitate: Secondary | ICD-10-CM | POA: Diagnosis present

## 2023-06-25 DIAGNOSIS — I639 Cerebral infarction, unspecified: Secondary | ICD-10-CM | POA: Diagnosis not present

## 2023-06-25 DIAGNOSIS — K5900 Constipation, unspecified: Secondary | ICD-10-CM | POA: Diagnosis not present

## 2023-06-25 DIAGNOSIS — G4733 Obstructive sleep apnea (adult) (pediatric): Secondary | ICD-10-CM | POA: Diagnosis not present

## 2023-06-25 DIAGNOSIS — E1142 Type 2 diabetes mellitus with diabetic polyneuropathy: Secondary | ICD-10-CM | POA: Diagnosis present

## 2023-06-25 DIAGNOSIS — G40909 Epilepsy, unspecified, not intractable, without status epilepticus: Secondary | ICD-10-CM | POA: Diagnosis present

## 2023-06-25 DIAGNOSIS — R531 Weakness: Secondary | ICD-10-CM

## 2023-06-25 DIAGNOSIS — I63442 Cerebral infarction due to embolism of left cerebellar artery: Secondary | ICD-10-CM | POA: Diagnosis present

## 2023-06-25 DIAGNOSIS — R600 Localized edema: Secondary | ICD-10-CM | POA: Diagnosis not present

## 2023-06-25 DIAGNOSIS — I10 Essential (primary) hypertension: Secondary | ICD-10-CM | POA: Diagnosis not present

## 2023-06-25 DIAGNOSIS — L97911 Non-pressure chronic ulcer of unspecified part of right lower leg limited to breakdown of skin: Secondary | ICD-10-CM | POA: Diagnosis not present

## 2023-06-25 DIAGNOSIS — J441 Chronic obstructive pulmonary disease with (acute) exacerbation: Secondary | ICD-10-CM | POA: Diagnosis not present

## 2023-06-25 DIAGNOSIS — G9341 Metabolic encephalopathy: Secondary | ICD-10-CM | POA: Diagnosis present

## 2023-06-25 DIAGNOSIS — J4489 Other specified chronic obstructive pulmonary disease: Secondary | ICD-10-CM | POA: Diagnosis present

## 2023-06-25 DIAGNOSIS — M10071 Idiopathic gout, right ankle and foot: Secondary | ICD-10-CM | POA: Diagnosis not present

## 2023-06-25 DIAGNOSIS — M109 Gout, unspecified: Secondary | ICD-10-CM | POA: Diagnosis not present

## 2023-06-25 DIAGNOSIS — I83019 Varicose veins of right lower extremity with ulcer of unspecified site: Secondary | ICD-10-CM | POA: Diagnosis not present

## 2023-06-25 DIAGNOSIS — E1122 Type 2 diabetes mellitus with diabetic chronic kidney disease: Secondary | ICD-10-CM | POA: Diagnosis present

## 2023-06-25 DIAGNOSIS — I130 Hypertensive heart and chronic kidney disease with heart failure and stage 1 through stage 4 chronic kidney disease, or unspecified chronic kidney disease: Secondary | ICD-10-CM | POA: Diagnosis present

## 2023-06-25 DIAGNOSIS — F329 Major depressive disorder, single episode, unspecified: Secondary | ICD-10-CM | POA: Diagnosis present

## 2023-06-25 DIAGNOSIS — N179 Acute kidney failure, unspecified: Secondary | ICD-10-CM | POA: Diagnosis not present

## 2023-06-25 DIAGNOSIS — I452 Bifascicular block: Secondary | ICD-10-CM | POA: Diagnosis present

## 2023-06-25 DIAGNOSIS — I63542 Cerebral infarction due to unspecified occlusion or stenosis of left cerebellar artery: Secondary | ICD-10-CM | POA: Diagnosis not present

## 2023-06-25 DIAGNOSIS — F5101 Primary insomnia: Secondary | ICD-10-CM | POA: Diagnosis not present

## 2023-06-25 DIAGNOSIS — I5033 Acute on chronic diastolic (congestive) heart failure: Secondary | ICD-10-CM | POA: Diagnosis not present

## 2023-06-25 DIAGNOSIS — L97819 Non-pressure chronic ulcer of other part of right lower leg with unspecified severity: Secondary | ICD-10-CM | POA: Diagnosis present

## 2023-06-25 DIAGNOSIS — G894 Chronic pain syndrome: Secondary | ICD-10-CM | POA: Diagnosis not present

## 2023-06-25 DIAGNOSIS — D72829 Elevated white blood cell count, unspecified: Secondary | ICD-10-CM | POA: Diagnosis not present

## 2023-06-25 DIAGNOSIS — I350 Nonrheumatic aortic (valve) stenosis: Secondary | ICD-10-CM | POA: Diagnosis present

## 2023-06-25 DIAGNOSIS — I69354 Hemiplegia and hemiparesis following cerebral infarction affecting left non-dominant side: Secondary | ICD-10-CM | POA: Diagnosis not present

## 2023-06-25 DIAGNOSIS — F09 Unspecified mental disorder due to known physiological condition: Secondary | ICD-10-CM | POA: Diagnosis not present

## 2023-06-25 DIAGNOSIS — L97921 Non-pressure chronic ulcer of unspecified part of left lower leg limited to breakdown of skin: Secondary | ICD-10-CM | POA: Diagnosis not present

## 2023-06-25 DIAGNOSIS — J9602 Acute respiratory failure with hypercapnia: Secondary | ICD-10-CM | POA: Diagnosis present

## 2023-06-25 DIAGNOSIS — I493 Ventricular premature depolarization: Secondary | ICD-10-CM | POA: Diagnosis not present

## 2023-06-25 DIAGNOSIS — N1831 Chronic kidney disease, stage 3a: Secondary | ICD-10-CM | POA: Diagnosis present

## 2023-06-25 DIAGNOSIS — I480 Paroxysmal atrial fibrillation: Secondary | ICD-10-CM | POA: Diagnosis present

## 2023-06-25 DIAGNOSIS — Z952 Presence of prosthetic heart valve: Secondary | ICD-10-CM | POA: Diagnosis not present

## 2023-06-25 DIAGNOSIS — E662 Morbid (severe) obesity with alveolar hypoventilation: Secondary | ICD-10-CM | POA: Diagnosis present

## 2023-06-25 DIAGNOSIS — G4700 Insomnia, unspecified: Secondary | ICD-10-CM | POA: Diagnosis not present

## 2023-06-25 DIAGNOSIS — E1169 Type 2 diabetes mellitus with other specified complication: Secondary | ICD-10-CM | POA: Diagnosis not present

## 2023-06-25 DIAGNOSIS — G629 Polyneuropathy, unspecified: Secondary | ICD-10-CM | POA: Diagnosis not present

## 2023-06-25 DIAGNOSIS — M792 Neuralgia and neuritis, unspecified: Secondary | ICD-10-CM | POA: Diagnosis not present

## 2023-06-25 DIAGNOSIS — I83018 Varicose veins of right lower extremity with ulcer other part of lower leg: Secondary | ICD-10-CM | POA: Diagnosis present

## 2023-06-25 DIAGNOSIS — I951 Orthostatic hypotension: Secondary | ICD-10-CM | POA: Diagnosis not present

## 2023-06-25 DIAGNOSIS — L97829 Non-pressure chronic ulcer of other part of left lower leg with unspecified severity: Secondary | ICD-10-CM | POA: Diagnosis present

## 2023-06-25 DIAGNOSIS — Z6841 Body Mass Index (BMI) 40.0 and over, adult: Secondary | ICD-10-CM | POA: Diagnosis not present

## 2023-06-25 DIAGNOSIS — I77810 Thoracic aortic ectasia: Secondary | ICD-10-CM | POA: Diagnosis present

## 2023-06-25 DIAGNOSIS — F32A Depression, unspecified: Secondary | ICD-10-CM | POA: Diagnosis not present

## 2023-06-25 DIAGNOSIS — I5032 Chronic diastolic (congestive) heart failure: Secondary | ICD-10-CM | POA: Diagnosis present

## 2023-06-25 LAB — CBG MONITORING, ED
Glucose-Capillary: 106 mg/dL — ABNORMAL HIGH (ref 70–99)
Glucose-Capillary: 179 mg/dL — ABNORMAL HIGH (ref 70–99)
Glucose-Capillary: 121 mg/dL — ABNORMAL HIGH (ref 70–99)

## 2023-06-25 LAB — CBC
HCT: 40.6 % (ref 39.0–52.0)
Hemoglobin: 12.9 g/dL — ABNORMAL LOW (ref 13.0–17.0)
MCH: 29.1 pg (ref 26.0–34.0)
MCHC: 31.8 g/dL (ref 30.0–36.0)
MCV: 91.4 fL (ref 80.0–100.0)
Platelets: 263 10*3/uL (ref 150–400)
RBC: 4.44 MIL/uL (ref 4.22–5.81)
RDW: 17.2 % — ABNORMAL HIGH (ref 11.5–15.5)
WBC: 9.9 10*3/uL (ref 4.0–10.5)
nRBC: 0 % (ref 0.0–0.2)

## 2023-06-25 LAB — COMPREHENSIVE METABOLIC PANEL
ALT: 18 U/L (ref 0–44)
AST: 15 U/L (ref 15–41)
Albumin: 3.3 g/dL — ABNORMAL LOW (ref 3.5–5.0)
Alkaline Phosphatase: 45 U/L (ref 38–126)
Anion gap: 8 (ref 5–15)
BUN: 30 mg/dL — ABNORMAL HIGH (ref 8–23)
CO2: 26 mmol/L (ref 22–32)
Calcium: 8.9 mg/dL (ref 8.9–10.3)
Chloride: 106 mmol/L (ref 98–111)
Creatinine, Ser: 1.14 mg/dL (ref 0.61–1.24)
GFR, Estimated: >60 mL/min (ref >60)
Glucose, Bld: 109 mg/dL — ABNORMAL HIGH (ref 70–99)
Potassium: 3.2 mmol/L — ABNORMAL LOW (ref 3.5–5.1)
Sodium: 140 mmol/L (ref 135–145)
Total Bilirubin: 0.4 mg/dL (ref <1.2)
Total Protein: 6.4 g/dL — ABNORMAL LOW (ref 6.5–8.1)

## 2023-06-25 LAB — TSH: TSH: 1.561 u[IU]/mL (ref 0.350–4.500)

## 2023-06-25 LAB — GLUCOSE, CAPILLARY
Glucose-Capillary: 145 mg/dL — ABNORMAL HIGH (ref 70–99)
Glucose-Capillary: 99 mg/dL (ref 70–99)

## 2023-06-25 LAB — LIPID PANEL
Cholesterol: 151 mg/dL (ref 0–200)
HDL: 30 mg/dL — ABNORMAL LOW (ref >40)
LDL Cholesterol: 79 mg/dL (ref 0–99)
Total CHOL/HDL Ratio: 5 {ratio}
Triglycerides: 209 mg/dL — ABNORMAL HIGH (ref <150)
VLDL: 42 mg/dL — ABNORMAL HIGH (ref 0–40)

## 2023-06-25 MED ORDER — VENLAFAXINE HCL ER 75 MG PO CP24
225.0000 mg | ORAL_CAPSULE | Freq: Every day | ORAL | Status: DC
  Administered 2023-06-25 – 2023-06-28 (×4): 225 mg via ORAL
  Filled 2023-06-25 (×4): qty 3

## 2023-06-25 MED ORDER — INSULIN ASPART 100 UNIT/ML IJ SOLN
12.0000 [IU] | Freq: Three times a day (TID) | INTRAMUSCULAR | Status: DC
  Administered 2023-06-27 (×3): 12 [IU] via SUBCUTANEOUS
  Administered 2023-06-28: 6 [IU] via SUBCUTANEOUS
  Administered 2023-06-28 (×2): 12 [IU] via SUBCUTANEOUS
  Filled 2023-06-25: qty 0.12

## 2023-06-25 MED ORDER — INSULIN ASPART 100 UNIT/ML IJ SOLN: 0.0000 [IU] | Freq: Every day | INTRAMUSCULAR | Status: DC

## 2023-06-25 MED ORDER — ARIPIPRAZOLE 5 MG PO TABS
5.0000 mg | ORAL_TABLET | Freq: Every day | ORAL | Status: DC
  Administered 2023-06-25 – 2023-06-28 (×4): 5 mg via ORAL
  Filled 2023-06-25 (×4): qty 1

## 2023-06-25 MED ORDER — FLUTICASONE FUROATE-VILANTEROL 200-25 MCG/ACT IN AEPB
1.0000 | INHALATION_SPRAY | Freq: Every day | RESPIRATORY_TRACT | Status: DC
  Administered 2023-06-25 – 2023-06-26 (×2): 1 via RESPIRATORY_TRACT
  Filled 2023-06-25 (×2): qty 28

## 2023-06-25 MED ORDER — UMECLIDINIUM BROMIDE 62.5 MCG/ACT IN AEPB
1.0000 | INHALATION_SPRAY | Freq: Every day | RESPIRATORY_TRACT | Status: DC
  Administered 2023-06-25 – 2023-06-28 (×3): 1 via RESPIRATORY_TRACT
  Filled 2023-06-25: qty 7

## 2023-06-25 MED ORDER — FENOFIBRATE 160 MG PO TABS
160.0000 mg | ORAL_TABLET | Freq: Every day | ORAL | Status: DC
  Administered 2023-06-25 – 2023-06-28 (×4): 160 mg via ORAL
  Filled 2023-06-25 (×4): qty 1

## 2023-06-25 MED ORDER — TORSEMIDE 20 MG PO TABS
120.0000 mg | ORAL_TABLET | Freq: Two times a day (BID) | ORAL | Status: DC
  Administered 2023-06-25 – 2023-06-28 (×8): 120 mg via ORAL
  Filled 2023-06-25 (×8): qty 6

## 2023-06-25 MED ORDER — INSULIN GLARGINE-YFGN 100 UNIT/ML ~~LOC~~ SOLN
40.0000 [IU] | SUBCUTANEOUS | Status: DC
  Administered 2023-06-25 – 2023-06-28 (×4): 40 [IU] via SUBCUTANEOUS
  Filled 2023-06-25 (×4): qty 0.4

## 2023-06-25 MED ORDER — INSULIN ASPART 100 UNIT/ML IJ SOLN: 0.0000 [IU] | Freq: Three times a day (TID) | INTRAMUSCULAR | Status: DC

## 2023-06-25 MED ORDER — ATORVASTATIN CALCIUM 40 MG PO TABS
40.0000 mg | ORAL_TABLET | Freq: Every day | ORAL | Status: DC
  Administered 2023-06-25: 40 mg via ORAL
  Filled 2023-06-25: qty 1

## 2023-06-25 MED ORDER — ACETAMINOPHEN 160 MG/5ML PO SOLN: 650.0000 mg | ORAL | Status: DC | PRN

## 2023-06-25 MED ORDER — LACOSAMIDE 50 MG PO TABS
150.0000 mg | ORAL_TABLET | Freq: Two times a day (BID) | ORAL | Status: DC
  Administered 2023-06-25 – 2023-06-28 (×7): 150 mg via ORAL
  Filled 2023-06-25 (×7): qty 3

## 2023-06-25 MED ORDER — PREGABALIN 75 MG PO CAPS
150.0000 mg | ORAL_CAPSULE | Freq: Three times a day (TID) | ORAL | Status: DC
  Administered 2023-06-25 – 2023-06-28 (×11): 150 mg via ORAL
  Filled 2023-06-25 (×11): qty 2

## 2023-06-25 MED ORDER — METOPROLOL TARTRATE 5 MG/5ML IV SOLN: 5.0000 mg | INTRAVENOUS | Status: DC | PRN

## 2023-06-25 MED ORDER — LEVETIRACETAM 500 MG PO TABS
500.0000 mg | ORAL_TABLET | Freq: Two times a day (BID) | ORAL | Status: DC
Start: 2023-06-25 — End: 2023-06-28
  Administered 2023-06-25 – 2023-06-28 (×7): 500 mg via ORAL
  Filled 2023-06-25 (×7): qty 1

## 2023-06-25 MED ORDER — BUSPIRONE HCL 5 MG PO TABS
10.0000 mg | ORAL_TABLET | Freq: Three times a day (TID) | ORAL | Status: DC
  Administered 2023-06-25 – 2023-06-28 (×11): 10 mg via ORAL
  Filled 2023-06-25 (×3): qty 2
  Filled 2023-06-25: qty 1
  Filled 2023-06-25 (×7): qty 2

## 2023-06-25 MED ORDER — INSULIN ASPART 100 UNIT/ML IJ SOLN: 0.0000 [IU] | INTRAMUSCULAR | Status: DC

## 2023-06-25 MED ORDER — ALLOPURINOL 100 MG PO TABS
200.0000 mg | ORAL_TABLET | Freq: Two times a day (BID) | ORAL | Status: DC
  Administered 2023-06-25 – 2023-06-26 (×3): 200 mg via ORAL
  Filled 2023-06-25 (×3): qty 2

## 2023-06-25 MED ORDER — INSULIN ASPART 100 UNIT/ML IJ SOLN
0.0000 [IU] | Freq: Three times a day (TID) | INTRAMUSCULAR | Status: DC
  Administered 2023-06-25 – 2023-06-27 (×5): 2 [IU] via SUBCUTANEOUS
  Administered 2023-06-28: 5 [IU] via SUBCUTANEOUS
  Administered 2023-06-28: 2 [IU] via SUBCUTANEOUS

## 2023-06-25 MED ORDER — KETOROLAC TROMETHAMINE 30 MG/ML IJ SOLN
30.0000 mg | Freq: Once | INTRAMUSCULAR | Status: AC
  Administered 2023-06-25: 30 mg via INTRAVENOUS
  Filled 2023-06-25: qty 1

## 2023-06-25 MED ORDER — ICOSAPENT ETHYL 1 G PO CAPS
2.0000 g | ORAL_CAPSULE | Freq: Two times a day (BID) | ORAL | Status: DC
  Administered 2023-06-25 – 2023-06-28 (×8): 2 g via ORAL
  Filled 2023-06-25 (×9): qty 2

## 2023-06-25 MED ORDER — ACETAMINOPHEN 325 MG PO TABS
650.0000 mg | ORAL_TABLET | ORAL | Status: DC | PRN
  Administered 2023-06-28: 650 mg via ORAL
  Filled 2023-06-25 (×4): qty 2

## 2023-06-25 MED ORDER — HYDROCODONE-ACETAMINOPHEN 5-325 MG PO TABS
1.0000 | ORAL_TABLET | Freq: Four times a day (QID) | ORAL | Status: DC | PRN
  Administered 2023-06-25 – 2023-06-28 (×8): 1 via ORAL
  Filled 2023-06-25 (×8): qty 1

## 2023-06-25 MED ORDER — STROKE: EARLY STAGES OF RECOVERY BOOK
Freq: Once | Status: AC
Start: 2023-06-26 — End: 2023-06-26
  Filled 2023-06-25: qty 1

## 2023-06-25 MED ORDER — ACETAMINOPHEN 650 MG RE SUPP
650.0000 mg | RECTAL | Status: DC | PRN
Start: 2023-06-25 — End: 2023-06-27

## 2023-06-25 MED ORDER — APIXABAN 5 MG PO TABS
5.0000 mg | ORAL_TABLET | Freq: Two times a day (BID) | ORAL | Status: DC
  Administered 2023-06-25 – 2023-06-28 (×7): 5 mg via ORAL
  Filled 2023-06-25 (×7): qty 1

## 2023-06-25 NOTE — ED Notes (Signed)
Pt assisted with BM

## 2023-06-25 NOTE — Progress Notes (Addendum)
STROKE TEAM PROGRESS NOTE   BRIEF HPI: [name] is a 67 y.o. male with history of Acquired dilation of ascending aorta and aortic root (HCC), Adenomatous colon polyp (2007), Anemia, Anxiety, Asthma, BPH without obstruction/lower urinary tract symptoms (02/22/2017), Chronic diastolic (congestive) heart failure (HCC), Chronic venous stasis (03/07/2019), COPD (chronic obstructive pulmonary disease) (HCC), Coronary artery calcification seen on CAT scan, Depression, Diabetic neuropathy (HCC) (09/11/2019), History of colon polyps (08/24/2018), Hypertension, Morbid obesity (HCC), OSA (obstructive sleep apnea), Pain due to onychomycosis of toenails of both feet (09/11/2019), Peripheral neuropathy (02/22/2017), Primary osteoarthritis, left shoulder (03/05/2017), PTSD (post-traumatic stress disorder), Pure hypercholesterolemia, QT prolongation (03/07/2019), S/P TAVR (transcatheter aortic valve replacement) (02/07/2023), Seizures (HCC), Severe aortic stenosis, Sinus tachycardia (03/07/2019), Sleep apnea, and Type 2 diabetes mellitus with vascular disease (HCC) (09/11/2019).   Has residual left-sided weakness from prior strokes, brought in by EMS when they were called to evaluate him for unresponsiveness.  Last known well was 5:30 PM.  Patient was found unresponsive on his recliner.  He had to be sternal rubbed before he came around.  No witnessed seizure activity.  On coming around, it was noted that he was weak on the left side and had slurred speech and was slow to respond to questions.  Given the fact that he was on Eliquis and had prior strokes, code stroke was activated and he was brought in by emergency traffic to the ER. Evaluated at the bridge.  See detailed exam below.   Evaluated later in the room-had some neck shaking but was awake during that episode-it almost looks like he is volitionally shaking his head to say no with increasing frequency.  No postictal state following this.  No tongue biting.   No urinary incontinence   LKW: 5:30 PM 06/24/2023 Modified rankin score: 4-Needs assistance to walk and tend to bodily needs IV Thrombolysis: No-on Eliquis EVT: No ELVO  NIH on Admission 9  SIGNIFICANT HOSPITAL EVENTS MRI w/ acute left cerebellar stroke  INTERIM HISTORY/SUBJECTIVE Feels better this morning, some mild neck pain. Chronic left sided weakness from prior strokes. Patient had worsening left sided weakness and slurred speech yesterday. Weakness and numbness feels better currently. Compliant on home eliquis.   PT/OT/SLP and ECHO today   OBJECTIVE  CBC    Component Value Date/Time   WBC 9.9 06/25/2023 0426   RBC 4.44 06/25/2023 0426   HGB 12.9 (L) 06/25/2023 0426   HCT 40.6 06/25/2023 0426   PLT 263 06/25/2023 0426   MCV 91.4 06/25/2023 0426   MCH 29.1 06/25/2023 0426   MCHC 31.8 06/25/2023 0426   RDW 17.2 (H) 06/25/2023 0426   LYMPHSABS 1.8 06/24/2023 2109   MONOABS 0.8 06/24/2023 2109   EOSABS 0.5 06/24/2023 2109   BASOSABS 0.1 06/24/2023 2109    BMET    Component Value Date/Time   NA 140 06/25/2023 0426   NA 136 02/15/2023 1415   K 3.2 (L) 06/25/2023 0426   CL 106 06/25/2023 0426   CO2 26 06/25/2023 0426   GLUCOSE 109 (H) 06/25/2023 0426   BUN 30 (H) 06/25/2023 0426   BUN 37 (H) 02/15/2023 1415   CREATININE 1.14 06/25/2023 0426   CALCIUM 8.9 06/25/2023 0426   EGFR 78 02/15/2023 1415   GFRNONAA >60 06/25/2023 0426    IMAGING past 24 hours MR BRAIN WO CONTRAST  Result Date: 06/25/2023 CLINICAL DATA:  Initial evaluation for neuro deficit, stroke suspected. EXAM: MRI HEAD WITHOUT CONTRAST TECHNIQUE: Multiplanar, multiecho pulse sequences of the brain and  surrounding structures were obtained without intravenous contrast. COMPARISON:  Prior CTs from 06/24/2023. FINDINGS: Brain: Mild age-related cerebral volume loss. Few scattered subcentimeter foci of T2/FLAIR signal intensity seen involving the supratentorial cerebral white matter, most like related  chronic microvascular ischemic disease, less than is typically seen for patient age. 5 mm focus of restricted diffusion seen involving the left cerebellum (series 5, image 57), consistent with a small acute ischemic nonhemorrhagic infarct. No other evidence for acute or subacute ischemia elsewhere. No acute or chronic intracranial blood products. No mass lesion, midline shift or mass effect. No hydrocephalus or extra-axial fluid collection. Pituitary gland suprasellar region within normal limits. Vascular: Major intracranial vascular flow voids are maintained. Skull and upper cervical spine: Craniocervical junction within normal limits. Bone marrow signal intensity normal. No scalp soft tissue abnormality. Sinuses/Orbits: Globes and orbital soft tissues within normal limits. Mild mucosal thickening present about the sphenoid ethmoidal sinuses. No mastoid effusion. Other: None. IMPRESSION: 1. 5 mm acute ischemic nonhemorrhagic left cerebellar infarct. 2. Underlying mild age-related cerebral volume loss with mild chronic microvascular ischemic disease. Electronically Signed   By: Rise Mu M.D.   On: 06/25/2023 01:14   DG Chest Portable 1 View  Result Date: 06/24/2023 CLINICAL DATA:  Altered level of consciousness EXAM: PORTABLE CHEST 1 VIEW COMPARISON:  05/15/2023 FINDINGS: Single frontal view of the chest demonstrates a stable cardiac silhouette. Chronic scarring at the lung bases. No acute airspace disease, effusion, or pneumothorax. No acute bony abnormalities. IMPRESSION: 1. Chronic bibasilar scarring.  No acute airspace disease. Electronically Signed   By: Sharlet Salina M.D.   On: 06/24/2023 22:52   CT ANGIO HEAD NECK W WO CM  Result Date: 06/24/2023 CLINICAL DATA:  Stroke/TIA. Determine embolic source. Left-sided facial droop. EXAM: CT ANGIOGRAPHY HEAD AND NECK WITH AND WITHOUT CONTRAST TECHNIQUE: Multidetector CT imaging of the head and neck was performed using the standard protocol  during bolus administration of intravenous contrast. Multiplanar CT image reconstructions and MIPs were obtained to evaluate the vascular anatomy. Carotid stenosis measurements (when applicable) are obtained utilizing NASCET criteria, using the distal internal carotid diameter as the denominator. RADIATION DOSE REDUCTION: This exam was performed according to the departmental dose-optimization program which includes automated exposure control, adjustment of the mA and/or kV according to patient size and/or use of iterative reconstruction technique. CONTRAST:  75mL OMNIPAQUE IOHEXOL 350 MG/ML SOLN COMPARISON:  CT head without contrast 06/24/2023. CT angio head and neck 05/07/2023. FINDINGS: CTA NECK FINDINGS Aortic arch: Atherosclerotic calcifications are present at the aortic arch without significant stenosis relative to the distal vessel. A scratched at the left common carotid artery shares a common origin with the innominate artery. Right carotid system: The right common carotid artery is within normal limits. Atherosclerotic calcifications are present at the bifurcation without significant stenosis. Mild tortuosity is present in the cervical left ICA. Left carotid system: The left common carotid artery is within normal limits. Atherosclerotic calcifications are present at the bifurcation. No significant stenosis is present. Mild tortuosity is present the cervical left ICA. Vertebral arteries: The left vertebral artery is the dominant vessel. Both vertebral arteries originate from the subclavian arteries without significant stenosis. Significant stenosis is present either vertebral artery in. Skeleton: Neck level degenerative changes are again noted in the cervical spine. No focal osseous lesions are present. Other neck: Soft tissues the neck are otherwise unremarkable. Salivary glands are within normal limits. Thyroid is normal. No significant adenopathy is present. No focal mucosal or submucosal lesions are  present.  Upper chest: Mild dependent atelectasis is present in the lungs bilaterally. Review of the MIP images confirms the above findings CTA HEAD FINDINGS Anterior circulation: The internal carotid arteries are within normal limits from the skull base to the ICA termini. The A1 and M1 segments are normal. The anterior communicating artery is patent. MCA bifurcations are normal. The ACA and MCA branch vessels are within normal limits. No aneurysm is present. Posterior circulation: Left vertebral artery is dominant vessel. The PICA origins are visualized and normal. The vertebrobasilar junction and basilar artery are normal. The superior cerebellar arteries are patent. Both posterior cerebral arteries originate from basilar tip. PCA branch vessels are normal bilaterally. Venous sinuses: The dural sinuses are patent. The straight sinus and deep cerebral veins are intact. Cortical veins are within normal limits. No significant vascular malformation is evident. Anatomic variants: None Review of the MIP images confirms the above findings IMPRESSION: 1. No emergent large vessel occlusion. 2. Atherosclerotic changes at the carotid bifurcations bilaterally without significant stenosis. 3. Mild tortuosity of the cervical internal carotid arteries bilaterally likely reflects chronic hypertension. 4. Degenerative changes in the cervical spine. 5.  Aortic Atherosclerosis (ICD10-I70.0). Electronically Signed   By: Marin Roberts M.D.   On: 06/24/2023 21:33   CT HEAD CODE STROKE WO CONTRAST  Result Date: 06/24/2023 CLINICAL DATA:  Code stroke.  Initial evaluation for acute stroke. EXAM: CT HEAD WITHOUT CONTRAST TECHNIQUE: Contiguous axial images were obtained from the base of the skull through the vertex without intravenous contrast. RADIATION DOSE REDUCTION: This exam was performed according to the departmental dose-optimization program which includes automated exposure control, adjustment of the mA and/or kV  according to patient size and/or use of iterative reconstruction technique. COMPARISON:  Prior study from 05/07/2023 FINDINGS: Brain: Cerebral volume within normal limits. Mild chronic microvascular ischemic disease. No acute intracranial hemorrhage. No acute large vessel territory infarct. No mass lesion or midline shift. No hydrocephalus or extra-axial fluid collection. Vascular: No abnormal hyperdense vessel. Skull: Scalp soft tissues within normal limits.  Calvarium intact. Sinuses/Orbits: Globes and orbital soft tissues within normal limits. Mild mucosal thickening about the ethmoidal air cells. Paranasal sinuses are otherwise clear. No significant mastoid effusion. Other: None. ASPECTS [facility] Stroke Program Early CT Score) - Ganglionic level infarction (caudate, lentiform nuclei, internal capsule, insula, M1-M3 cortex): 7 - Supraganglionic infarction (M4-M6 cortex): 3 Total score (0-10 with 10 being normal): 10 IMPRESSION: 1. No acute intracranial abnormality. 2. Aspects is 10. 3. Mild chronic microvascular ischemic disease. These results were communicated to Dr. Wilford Corner at 9:14 pm on 06/24/2023 by text page via the [facility] messaging system. Electronically Signed   By: Rise Mu M.D.   On: 06/24/2023 21:15    Vitals:   06/25/23 0447 06/25/23 0451 06/25/23 0500 06/25/23 0605  BP: (!) 98/57  97/60 (!) 91/56  Pulse: 87  90 88  Resp: 13  18 18   Temp:  (!) 97.4 F (36.3 C)    TempSrc:  Oral    SpO2: 95%   93%  Weight:      Height:         PHYSICAL EXAM General:  Alert, well-nourished, well-developed patient in no acute distress Psych:  Mood and affect appropriate for situation Respiratory:  Regular, unlabored respirations on 3L  GI: Abdomen soft and nontender   NEURO:  Mental Status: AA&Ox3, patient is able to give clear and coherent history Speech/Language: speech is without dysarthria or aphasia.  Cranial Nerves:  II: PERRL. Visual fields full.  III,  IV, VI: EOMI. Eyelids  elevate symmetrically.  V: Sensation decreased in the left VII: Face is symmetrical resting and smiling VIII: hearing intact to voice. IX, X: Palate elevates symmetrically. Phonation is normal.  OZ:[id number] shrug 5/5. XII: tongue is midline without fasciculations. Motor: Left sided weakness in upper, w/ pronation and mild drift 3/5, lower extremity  2/5   Tone: is normal and bulk is normal Sensation- Intact to light touch bilaterally. Extinction absent to light touch to DSS.   Coordination: FTN intact bilaterally delayed in left compared to right  Gait- deferred Most Recent NIH 3    ASSESSMENT/PLAN  Acute left cerebellar infarct  Etiology:  Chronic small vessel disease, infarct location does not completely fit left sided deficits,  Code Stroke CT head No acute abnormality. Chronic small vessel disease. ASPECTS 10.    CTA head & neck No LVO MRI  5 mm acute ischemic nonhemorrhagic left cerebellar infarct.  2D Echo pending  LDL 79 HgbA1c 6.4 VTE prophylaxis - Eliquis  Eliquis 5 mg BID prior to admission and will continue PT/OT/SLP evaluations pending  Disposition:  Pending   Hx of Stroke/TIA Residual left sided hemiparesis from prior strokes  Home meds: Eliquis 5 mg BID, Lipitor 40 mg daily    Atrial fibrillation Home Meds: Eliquis 5 mg BID and metoprolol 50 mg daily  Continue telemetry monitoring Continue home eliquis  Holding metoprolol   Hypertension Home meds:  Metoprolol succinate 50 mg daily, spironolactone 25 mg daily Stable Hold home anti-hypertensives  Permissive Hypertension for 24 hours Blood Pressure Goal: BP less than 220/110, can start normalizing BP tomorrow.    Hyperlipidemia Home meds:  Lipitor 40 mg daily and fenofibrate 160 mg daily resumed in hospital LDL 79, goal < 70  Continue home medications  Continue statin at discharge  Type 2 Diabetes Controlled  Home meds:  Metformin, Evaristo Bury, Novolog. Mounjaro HgbA1c 6.4, goal < 7.0 CBGs Semglee 40  units daily and SSI per primary team  Recommend close follow-up with PCP for better DM control  Other Stroke Risk Factors Obesity, Body mass index is 47.89 kg/m., BMI >/= 30 associated with increased stroke risk, recommend weight loss, diet and exercise as appropriate  Congestive heart failure: Hold home meds Farxiga 10 mg qAM, metoprolol 50 mg daily, spironolactone 25 mg daily  Obstructive sleep apnea, CPAP ordered   Other Active Problems Seizures: Home meds: Resume Keppra 500 mg BID and vimpat 150 mg BID  Peripheral neuropathy: Home meds:  Hold Pregabalin 150 mg TID daily  Depression: Home meds: Resume venlafaxine and abilify, hold trazodone  Anxiety: Home meds: Resume Buspar 10 mg TID daily  Gout:  Home meds: Resume Allopurinol   COPD: Trelegy, flonase and zyrtec resumed   Hospital day # 1   ATTENDING NOTE: I reviewed above note and agree with the assessment and plan. Pt was seen and examined. Together with the NP/PA, we formulated the assessment and plan of care which reflects our mutual decision.  I have made any additions or clarifications directly to the above note and agree with the findings and plan as currently documented.  For detailed assessment and plan, please refer to above/below as I have made changes wherever appropriate. I spent a total of 35 minutes dedicated to the care of this patient.   Windell Norfolk, MD Stroke Neurology 06/25/2023 2:52 PM    To contact Stroke Continuity provider, please refer to [email or web address]. After hours, contact General Neurology

## 2023-06-25 NOTE — Progress Notes (Signed)
PROGRESS NOTE    Lawrence [name]  ZOX:[id number] DOB: 09-15-1955 DOA: 06/24/2023 PCP: [name], PA-C  Chief Complaint  Patient presents with   Weakness    Hospital Course:  Lawrence [name] is 67 y.o. male with atrial fibrillation on anticoagulation, prior CVA with residual left-sided weakness, type 2 diabetes with neuropathy, seizure disorder on antiepileptics, CAD, COPD, OSA, history of severe aortic stenosis status post TAVR who presented to the ER on 11/16 via EMS as a code stroke.  Reportedly patient's family found him unresponsive in his recliner.  On arrival to the ER patient was hemodynamically stable with mild AKI.  CT head/CTA head and neck were largely within normal limits.  Neurology was consulted.  Brain MRI revealed 5 mm acute ischemic nonhemorrhagic left cerebellar infarct.  Patient was admitted to [facility] on 11/17  Subjective: Patient reports feeling much better than yesterday.  Currently no family at bedside.  He is conversant with minimal difficulty.  He does endorse left lower extremity weakness that is more severe than his baseline.  At this time he is nonambulatory given this weakness.    Objective: Vitals:   06/25/23 0500 06/25/23 0605 06/25/23 0840 06/25/23 0907  BP: 97/60 (!) 91/56  102/63  Pulse: 90 88  84  Resp: 18 18  11   Temp:   97.6 F (36.4 C)   TempSrc:   Oral   SpO2:  93%  94%  Weight:      Height:        Intake/Output Summary (Last 24 hours) at 06/25/2023 1114 Last data filed at 06/25/2023 0515 Gross per 24 hour  Intake --  Output 1400 ml  Net -1400 ml   Filed Weights   06/24/23 2100 06/24/23 2111  Weight: (!) 169.2 kg (!) 169.2 kg    Examination: General exam: Appears calm and comfortable, NAD, morbid obesity Respiratory system: No work of breathing, on 3 L O2  cardiovascular system: S1 & S2 heard, RRR.  Gastrointestinal system: Abdomen is nondistended, soft and nontender.  Neuro: Alert and oriented.  Conversant.  RLE 5/5, LLE  2/5, LUE 4/5, RUE 5/5 Extremities: Lower extremity lymphedema, chronic skin changes, some erythema bilaterally, left lower leg with some weeping and small open lesion. Skin: Left lower extremity edema Psychiatry: Demonstrates appropriate judgement and insight. Mood & affect appropriate for situation.   Assessment & Plan:  Principal Problem:   Acute CVA (cerebrovascular accident) (HCC) Active Problems:   Chronic diastolic CHF (congestive heart failure) (HCC)   Essential hypertension   COPD (chronic obstructive pulmonary disease) (HCC)   Hemiparesis affecting left side as late effect of cerebrovascular accident (CVA) (HCC)   Gout attack   Coronary artery calcification seen on CAT scan   Depression   CVA (cerebral vascular accident) (HCC)   Atrial fibrillation, chronic (HCC)  Acute CVA MRI with 5 mm acute ischemic left cerebellar infarct - Neurology consulted - Continue home dose Eliquis without changes - 2D echo with bubble study ordered and pending - Continue frequent neurochecks - Monitor on telemetry - Continue permissive hypertension, hold antihypertensives for now - LDL 79, will continue statin for now - PT/OT/SLP, suspect patient will need rehab at discharge.  History of CVA At baseline patient does have some left lower extremity weakness though he was previously ambulatory with a rollator - Home meds as above  Acute respiratory failure - Patient not on home O2 - Chest x-ray without acute airspace disease - Suspect current etiology is due to obesity hypoventilation syndrome  with acute deconditioning given current CVA -Continue supplemental O2, wean as tolerated.  Expect improvement with increased ambulation  OSA - CPAP at night  Lower extremity venous stasis Bilateral lower extremity venous stasis with ulcers - Wound care nursing consult - No concerns for acute infection at this time - Continue to monitor  Acute metabolic encephalopathy - Present on arrival, now  resolved to baseline  Chronic diastolic heart failure - Home med spironolactone, torsemide on hold given low BP and desires for permissive hypertension as above - Continue Eliquis, statin, fenofibrate  COPD - Continue Trelegy, Flonase, Zyrtec  Depression - Continue all home meds  Atrial fibrillation Neurology has cleared patient for resumption of Eliquis which he has been taking outpatient.  He denies any missed doses.  Currently he is rate controlled without metoprolol which we are holding for permissive hypertension. - Continue to monitor on telemetry  Hypertension - Permissive hypertension for new CVA.  Hold all antihypertensives for now  Type 2 diabetes with peripheral neuropathy - Sliding scale insulin, basal bolus as tolerated - Titrate up daily  Gout flare - Right great toe gout flare - Allopurinol resumed - Status post single dose IV Toradol  BMI 47 - Outpatient follow up for lifestyle modification and risk factor management   DVT prophylaxis: Home dose Eliquis Code Status: DNR Family Communication: None present at bedside, discussed with patient direction Disposition:  Status is: Inpatient Remains inpatient appropriate because: ongoing work up.  Anticipate eventual DC to SNF/Rehab. PT evals pending    Consultants:  Neuro  Procedures:  none  Antimicrobials:  Anti-infectives (From admission, onward)    None       Data Reviewed: I have personally reviewed following labs and imaging studies CBC: Recent Labs  Lab 06/24/23 2107 06/24/23 2109 06/24/23 2155 06/25/23 0426  WBC  --  9.0  --  9.9  NEUTROABS  --  5.8  --   --   HGB 15.0 14.0 13.3 12.9*  HCT 44.0 43.8 39.0 40.6  MCV  --  90.5  --  91.4  PLT  --  265  --  263   Basic Metabolic Panel: Recent Labs  Lab 06/24/23 2107 06/24/23 2109 06/24/23 2155 06/25/23 0426  NA 139 139 140 140  K 5.3* 4.3 3.1* 3.2*  CL 103 102  --  106  CO2  --  25  --  26  GLUCOSE 80 82  --  109*  BUN 44* 29*   --  30*  CREATININE 1.30* 1.18  --  1.14  CALCIUM  --  9.6  --  8.9   GFR: Estimated Creatinine Clearance: 105.5 mL/min (by C-G formula based on SCr of 1.14 mg/dL). Liver Function Tests: Recent Labs  Lab 06/24/23 2109 06/25/23 0426  AST 30 15  ALT 23 18  ALKPHOS 56 45  BILITOT 1.0 0.4  PROT 6.9 6.4*  ALBUMIN 3.7 3.3*   CBG: Recent Labs  Lab 06/25/23 0409 06/25/23 0839  GLUCAP 106* 179*    No results found for this or any previous visit (from the past 240 hour(s)).   Radiology Studies: MR BRAIN WO CONTRAST  Result Date: 06/25/2023 CLINICAL DATA:  Initial evaluation for neuro deficit, stroke suspected. EXAM: MRI HEAD WITHOUT CONTRAST TECHNIQUE: Multiplanar, multiecho pulse sequences of the brain and surrounding structures were obtained without intravenous contrast. COMPARISON:  Prior CTs from 06/24/2023. FINDINGS: Brain: Mild age-related cerebral volume loss. Few scattered subcentimeter foci of T2/FLAIR signal intensity seen involving the supratentorial cerebral white  matter, most like related chronic microvascular ischemic disease, less than is typically seen for patient age. 5 mm focus of restricted diffusion seen involving the left cerebellum (series 5, image 57), consistent with a small acute ischemic nonhemorrhagic infarct. No other evidence for acute or subacute ischemia elsewhere. No acute or chronic intracranial blood products. No mass lesion, midline shift or mass effect. No hydrocephalus or extra-axial fluid collection. Pituitary gland suprasellar region within normal limits. Vascular: Major intracranial vascular flow voids are maintained. Skull and upper cervical spine: Craniocervical junction within normal limits. Bone marrow signal intensity normal. No scalp soft tissue abnormality. Sinuses/Orbits: Globes and orbital soft tissues within normal limits. Mild mucosal thickening present about the sphenoid ethmoidal sinuses. No mastoid effusion. Other: None. IMPRESSION: 1. 5 mm  acute ischemic nonhemorrhagic left cerebellar infarct. 2. Underlying mild age-related cerebral volume loss with mild chronic microvascular ischemic disease. Electronically Signed   By: Rise Mu M.D.   On: 06/25/2023 01:14   DG Chest Portable 1 View  Result Date: 06/24/2023 CLINICAL DATA:  Altered level of consciousness EXAM: PORTABLE CHEST 1 VIEW COMPARISON:  05/15/2023 FINDINGS: Single frontal view of the chest demonstrates a stable cardiac silhouette. Chronic scarring at the lung bases. No acute airspace disease, effusion, or pneumothorax. No acute bony abnormalities. IMPRESSION: 1. Chronic bibasilar scarring.  No acute airspace disease. Electronically Signed   By: Sharlet Salina M.D.   On: 06/24/2023 22:52   CT ANGIO HEAD NECK W WO CM  Result Date: 06/24/2023 CLINICAL DATA:  Stroke/TIA. Determine embolic source. Left-sided facial droop. EXAM: CT ANGIOGRAPHY HEAD AND NECK WITH AND WITHOUT CONTRAST TECHNIQUE: Multidetector CT imaging of the head and neck was performed using the standard protocol during bolus administration of intravenous contrast. Multiplanar CT image reconstructions and MIPs were obtained to evaluate the vascular anatomy. Carotid stenosis measurements (when applicable) are obtained utilizing NASCET criteria, using the distal internal carotid diameter as the denominator. RADIATION DOSE REDUCTION: This exam was performed according to the departmental dose-optimization program which includes automated exposure control, adjustment of the mA and/or kV according to patient size and/or use of iterative reconstruction technique. CONTRAST:  75mL OMNIPAQUE IOHEXOL 350 MG/ML SOLN COMPARISON:  CT head without contrast 06/24/2023. CT angio head and neck 05/07/2023. FINDINGS: CTA NECK FINDINGS Aortic arch: Atherosclerotic calcifications are present at the aortic arch without significant stenosis relative to the distal vessel. A scratched at the left common carotid artery shares a common  origin with the innominate artery. Right carotid system: The right common carotid artery is within normal limits. Atherosclerotic calcifications are present at the bifurcation without significant stenosis. Mild tortuosity is present in the cervical left ICA. Left carotid system: The left common carotid artery is within normal limits. Atherosclerotic calcifications are present at the bifurcation. No significant stenosis is present. Mild tortuosity is present the cervical left ICA. Vertebral arteries: The left vertebral artery is the dominant vessel. Both vertebral arteries originate from the subclavian arteries without significant stenosis. Significant stenosis is present either vertebral artery in. Skeleton: Neck level degenerative changes are again noted in the cervical spine. No focal osseous lesions are present. Other neck: Soft tissues the neck are otherwise unremarkable. Salivary glands are within normal limits. Thyroid is normal. No significant adenopathy is present. No focal mucosal or submucosal lesions are present. Upper chest: Mild dependent atelectasis is present in the lungs bilaterally. Review of the MIP images confirms the above findings CTA HEAD FINDINGS Anterior circulation: The internal carotid arteries are within normal limits from the  skull base to the ICA termini. The A1 and M1 segments are normal. The anterior communicating artery is patent. MCA bifurcations are normal. The ACA and MCA branch vessels are within normal limits. No aneurysm is present. Posterior circulation: Left vertebral artery is dominant vessel. The PICA origins are visualized and normal. The vertebrobasilar junction and basilar artery are normal. The superior cerebellar arteries are patent. Both posterior cerebral arteries originate from basilar tip. PCA branch vessels are normal bilaterally. Venous sinuses: The dural sinuses are patent. The straight sinus and deep cerebral veins are intact. Cortical veins are within normal  limits. No significant vascular malformation is evident. Anatomic variants: None Review of the MIP images confirms the above findings IMPRESSION: 1. No emergent large vessel occlusion. 2. Atherosclerotic changes at the carotid bifurcations bilaterally without significant stenosis. 3. Mild tortuosity of the cervical internal carotid arteries bilaterally likely reflects chronic hypertension. 4. Degenerative changes in the cervical spine. 5.  Aortic Atherosclerosis (ICD10-I70.0). Electronically Signed   By: Marin Roberts M.D.   On: 06/24/2023 21:33   CT HEAD CODE STROKE WO CONTRAST  Result Date: 06/24/2023 CLINICAL DATA:  Code stroke.  Initial evaluation for acute stroke. EXAM: CT HEAD WITHOUT CONTRAST TECHNIQUE: Contiguous axial images were obtained from the base of the skull through the vertex without intravenous contrast. RADIATION DOSE REDUCTION: This exam was performed according to the departmental dose-optimization program which includes automated exposure control, adjustment of the mA and/or kV according to patient size and/or use of iterative reconstruction technique. COMPARISON:  Prior study from 05/07/2023 FINDINGS: Brain: Cerebral volume within normal limits. Mild chronic microvascular ischemic disease. No acute intracranial hemorrhage. No acute large vessel territory infarct. No mass lesion or midline shift. No hydrocephalus or extra-axial fluid collection. Vascular: No abnormal hyperdense vessel. Skull: Scalp soft tissues within normal limits.  Calvarium intact. Sinuses/Orbits: Globes and orbital soft tissues within normal limits. Mild mucosal thickening about the ethmoidal air cells. Paranasal sinuses are otherwise clear. No significant mastoid effusion. Other: None. ASPECTS [facility] Stroke Program Early CT Score) - Ganglionic level infarction (caudate, lentiform nuclei, internal capsule, insula, M1-M3 cortex): 7 - Supraganglionic infarction (M4-M6 cortex): 3 Total score (0-10 with 10 being  normal): 10 IMPRESSION: 1. No acute intracranial abnormality. 2. Aspects is 10. 3. Mild chronic microvascular ischemic disease. These results were communicated to Dr. Wilford Corner at 9:14 pm on 06/24/2023 by text page via the [facility] messaging system. Electronically Signed   By: Rise Mu M.D.   On: 06/24/2023 21:15    Scheduled Meds:  [START ON 06/26/2023]  stroke: early stages of recovery book   Does not apply Once   allopurinol  200 mg Oral BID   apixaban  5 mg Oral BID   ARIPiprazole  5 mg Oral Daily   atorvastatin  40 mg Oral Daily   busPIRone  10 mg Oral TID   fenofibrate  160 mg Oral Daily   fluticasone furoate-vilanterol  1 puff Inhalation Daily   And   umeclidinium bromide  1 puff Inhalation Daily   icosapent Ethyl  2 g Oral BID   insulin aspart  0-15 Units Subcutaneous TID WC   insulin aspart  0-5 Units Subcutaneous QHS   insulin aspart  12 Units Subcutaneous TID WC   insulin glargine-yfgn  40 Units Subcutaneous Q24H   lacosamide  150 mg Oral BID   levETIRAcetam  500 mg Oral BID   pregabalin  150 mg Oral TID   torsemide  120 mg Oral BID   venlafaxine  XR  225 mg Oral Q breakfast   Continuous Infusions:   LOS: 0 days    Time spent:   Debarah Crape, DO [facility]  To contact the attending physician between 7A-7P please use Epic Chat. To contact the covering physician during after hours 7P-7A, please review Amion.   06/25/2023, 11:14 AM

## 2023-06-25 NOTE — ED Notes (Signed)
Pt has passed swallow screen. Diet order requested from MD Crosley via EPIC message.

## 2023-06-25 NOTE — ED Notes (Signed)
Pt returned back to room from MRI.

## 2023-06-25 NOTE — Plan of Care (Signed)
  Problem: Education: Goal: Ability to describe self-care measures that may prevent or decrease complications (Diabetes Survival Skills Education) will improve Outcome: Progressing Goal: Individualized Educational Video(s) Outcome: Progressing   Problem: Coping: Goal: Ability to adjust to condition or change in health will improve Outcome: Progressing   Problem: Fluid Volume: Goal: Ability to maintain a balanced intake and output will improve Outcome: Progressing   Problem: Health Behavior/Discharge Planning: Goal: Ability to identify and utilize available resources and services will improve Outcome: Progressing Goal: Ability to manage health-related needs will improve Outcome: Progressing   Problem: Metabolic: Goal: Ability to maintain appropriate glucose levels will improve Outcome: Progressing   Problem: Nutritional: Goal: Maintenance of adequate nutrition will improve Outcome: Progressing Goal: Progress toward achieving an optimal weight will improve Outcome: Progressing   Problem: Skin Integrity: Goal: Risk for impaired skin integrity will decrease Outcome: Progressing   Problem: Tissue Perfusion: Goal: Adequacy of tissue perfusion will improve Outcome: Progressing   Problem: Education: Goal: Knowledge of disease or condition will improve Outcome: Progressing Goal: Knowledge of secondary prevention will improve Outcome: Progressing Goal: Knowledge of patient specific risk factors will improve Outcome: Progressing   Problem: Ischemic Stroke/TIA Tissue Perfusion: Goal: Complications of ischemic stroke/TIA will be minimized Outcome: Progressing   Problem: Coping: Goal: Will verbalize positive feelings about self Outcome: Progressing Goal: Will identify appropriate support needs Outcome: Progressing   Problem: Health Behavior/Discharge Planning: Goal: Ability to manage health-related needs will improve Outcome: Progressing Goal: Goals will be  collaboratively established with patient/family Outcome: Progressing   Problem: Self-Care: Goal: Ability to participate in self-care as condition permits will improve Outcome: Progressing Goal: Verbalization of feelings and concerns over difficulty with self-care will improve Outcome: Progressing Goal: Ability to communicate needs accurately will improve Outcome: Progressing   Problem: Nutrition: Goal: Risk of aspiration will decrease Outcome: Progressing Goal: Dietary intake will improve Outcome: Progressing   Problem: Education: Goal: Knowledge of General Education information will improve Description: Including pain rating scale, medication(s)/side effects and non-pharmacologic comfort measures Outcome: Progressing   Problem: Health Behavior/Discharge Planning: Goal: Ability to manage health-related needs will improve Outcome: Progressing   Problem: Clinical Measurements: Goal: Ability to maintain clinical measurements within normal limits will improve Outcome: Progressing Goal: Will remain free from infection Outcome: Progressing Goal: Diagnostic test results will improve Outcome: Progressing Goal: Respiratory complications will improve Outcome: Progressing Goal: Cardiovascular complication will be avoided Outcome: Progressing   Problem: Activity: Goal: Risk for activity intolerance will decrease Outcome: Progressing   Problem: Nutrition: Goal: Adequate nutrition will be maintained Outcome: Progressing   Problem: Coping: Goal: Level of anxiety will decrease Outcome: Progressing   Problem: Elimination: Goal: Will not experience complications related to bowel motility Outcome: Progressing Goal: Will not experience complications related to urinary retention Outcome: Progressing   Problem: Pain Management: Goal: General experience of comfort will improve Outcome: Progressing   Problem: Safety: Goal: Ability to remain free from injury will improve Outcome:  Progressing   Problem: Skin Integrity: Goal: Risk for impaired skin integrity will decrease Outcome: Progressing

## 2023-06-25 NOTE — Progress Notes (Signed)
   06/25/23 2129  BiPAP/CPAP/SIPAP  Reason BIPAP/CPAP not in use (S)  Non-compliant (Pt states he does not want to use a hospital cpap tonight, and that he would prefer to stay on the nasal cannula.  Will notify staff if he changes his mind)

## 2023-06-25 NOTE — ED Notes (Signed)
Pts wounds cleansed with wound cleanser. Pts open wounds on L lower leg dressed with non adherent gauze and wrapped.

## 2023-06-25 NOTE — H&P (Addendum)
LLE strength 1/5PCP:   [name], PA-C   Chief Complaint:  Left-sided weakness  HPI: This is a 67 year old male with past medical history of Atrial fibrillation on anticoagulation, prior CVA w/ residual left-sided weakness, T2DM w/ neuropathy, h/o seizure activity on antiepileptics,  CAD, COPD, OSA,  h/o severe aortic stenosis sp TAVR.  Patient lives at home with his family, today the family had difficulty waking the patient up.  He was poorly responsive in his recliner.  Per patient his wife states she was trying for at least 10 minutes.  911 was called.  Patient had left-sided weakness.  He came in as a code stroke.  Patient presenting blood pressure 155/102, HR 94.  Potassium 5.3, creatinine 1.3 [baseline normal].  UA without infection, UDS normal.  CT head/CTA head and neck : Normal.  MRI brain: 5 mm acute ischemic nonhemorrhagic left cerebellar infarct.  Neurology consulted to see patient. Patient additionally on 3 L oxygen, does not use oxygen at home.  Review of systems: Per HPI  Past Medical History: Past Medical History:  Diagnosis Date   Acquired dilation of ascending aorta and aortic root (HCC)    40mm by echo 01/2021   Adenomatous colon polyp 2007   Anemia    Anxiety    Asthma    BPH without obstruction/lower urinary tract symptoms 02/22/2017   Chronic diastolic (congestive) heart failure (HCC)    Chronic venous stasis 03/07/2019   COPD (chronic obstructive pulmonary disease) (HCC)    Coronary artery calcification seen on CAT scan    Depression    Diabetic neuropathy (HCC) 09/11/2019   History of colon polyps 08/24/2018   Hypertension    Morbid obesity (HCC)    OSA (obstructive sleep apnea)    Pain due to onychomycosis of toenails of both feet 09/11/2019   Peripheral neuropathy 02/22/2017   Primary osteoarthritis, left shoulder 03/05/2017   PTSD (post-traumatic stress disorder)    Pure hypercholesterolemia    QT prolongation 03/07/2019   S/P TAVR  (transcatheter aortic valve replacement) 02/07/2023   34mm Evolut FX via TF approach with Dr. Lynnette Caffey and Dr. Laneta Simmers   Seizures [facility])    Severe aortic stenosis    Sinus tachycardia 03/07/2019   Sleep apnea    CPAP   Type 2 diabetes mellitus with vascular disease (HCC) 09/11/2019   Past Surgical History:  Procedure Laterality Date   ENDOVENOUS ABLATION SAPHENOUS VEIN W/ LASER Right 08/20/2020   endovenous laser ablation right greater saphenous vein by Cari Caraway MD    ENDOVENOUS ABLATION SAPHENOUS VEIN W/ LASER Left 11/16/2022   endovenous laser ablation left greater saphenous vein by Cari Caraway MD   INTRAOPERATIVE TRANSTHORACIC ECHOCARDIOGRAM N/A 02/07/2023   Procedure: INTRAOPERATIVE TRANSTHORACIC ECHOCARDIOGRAM;  Surgeon: Orbie Pyo, MD;  Location: [facility] INVASIVE CV LAB;  Service: Open Heart Surgery;  Laterality: N/A;   JOINT REPLACEMENT     left knee replacement x 2   KNEE ARTHROSCOPY Bilateral    LEFT HEART CATH AND CORONARY ANGIOGRAPHY N/A 01/17/2018   Procedure: LEFT HEART CATH AND CORONARY ANGIOGRAPHY;  Surgeon: Marykay Lex, MD;  Location: [facility] INVASIVE CV LAB;  Service: Cardiovascular;  Laterality: N/A;   PRESSURE SENSOR/CARDIOMEMS N/A 08/26/2021   Procedure: PRESSURE SENSOR/CARDIOMEMS;  Surgeon: Laurey Morale, MD;  Location: [facility] INVASIVE CV LAB;  Service: Cardiovascular;  Laterality: N/A;   RIGHT HEART CATH N/A 08/26/2021   Procedure: RIGHT HEART CATH;  Surgeon: Laurey Morale, MD;  Location: [facility] INVASIVE CV LAB;  Service:  Cardiovascular;  Laterality: N/A;   RIGHT HEART CATH AND CORONARY ANGIOGRAPHY N/A 01/26/2023   Procedure: RIGHT HEART CATH AND CORONARY ANGIOGRAPHY;  Surgeon: Orbie Pyo, MD;  Location: [facility] INVASIVE CV LAB;  Service: Cardiovascular;  Laterality: N/A;   TEE WITHOUT CARDIOVERSION N/A 08/26/2021   Procedure: TRANSESOPHAGEAL ECHOCARDIOGRAM (TEE);  Surgeon: Laurey Morale, MD;  Location: [facility] ENDOSCOPY;  Service: Cardiovascular;  Laterality: N/A;    TOOTH EXTRACTION N/A 02/03/2023   Procedure: DENTAL RESTORATION/EXTRACTIONS;  Surgeon: [name], DMD;  Location: [facility] OR;  Service: Oral Surgery;  Laterality: N/A;   TRANSCATHETER AORTIC VALVE REPLACEMENT, TRANSFEMORAL Right 02/07/2023   Procedure: Transcatheter Aortic Valve Replacement, Transfemoral;  Surgeon: Orbie Pyo, MD;  Location: [facility] INVASIVE CV LAB;  Service: Open Heart Surgery;  Laterality: Right;   UMBILICAL HERNIA REPAIR      Medications: Prior to Admission medications   Medication Sig Start Date End Date Taking? Authorizing Provider  acetaminophen (TYLENOL) 650 MG CR tablet Take 1,300 mg by mouth every 8 (eight) hours as needed for pain.    [provider]  albuterol (VENTOLIN HFA) 108 (90 Base) MCG/ACT inhaler Inhale 2 puffs into the lungs every 6 (six) hours as needed for wheezing or shortness of breath. 04/17/23   Hunsucker, Lesia Sago, MD  Alcohol Swabs ([facility] PREP) 70 % PADS USE AS DIRECTED 05/11/23   Altamese [place], MD  allopurinol (ZYLOPRIM) 100 MG tablet Take 200 mg by mouth 2 (two) times daily.    [provider]  amoxicillin (AMOXIL) 500 MG tablet Take 4 tablets (2,000 mg total) by mouth as directed. 1 hour prior to dental work including cleanings 02/15/23   Janetta Hora, PA-C  apixaban (ELIQUIS) 5 MG TABS tablet Take 1 tablet (5 mg total) by mouth 2 (two) times daily. 04/17/23   Laurey Morale, MD  ARIPiprazole (ABILIFY) 5 MG tablet Take 1 tablet (5 mg total) by mouth at bedtime. 06/08/23   Mozingo, Thereasa Solo, NP  atorvastatin (LIPITOR) 40 MG tablet Take 1 tablet (40 mg total) by mouth daily. 04/17/23   Laurey Morale, MD  busPIRone (BUSPAR) 10 MG tablet Take 1 tablet (10 mg total) by mouth 3 (three) times daily. 04/17/23   Mozingo, Thereasa Solo, NP  cetirizine (ZYRTEC) 10 MG tablet Take 10 mg by mouth at bedtime.     [provider]  Cholecalciferol (VITAMIN D3 PO) Take 2,000 Units by mouth daily.    [provider]   dapagliflozin propanediol (FARXIGA) 10 MG TABS tablet TAKE ONE TABLET BY MOUTH BEFORE BREAKFAST Patient taking differently: Take 10 mg by mouth daily. 04/17/23   Laurey Morale, MD  diclofenac Sodium (VOLTAREN) 1 % GEL Apply 4 g topically 4 (four) times daily. To right knee. 10/31/22   Arrien, York Ram, MD  fenofibrate (TRICOR) 145 MG tablet Take 1 tablet (145 mg total) by mouth daily. 04/17/23   Laurey Morale, MD  ferrous sulfate 324 (65 Fe) MG TBEC Take 324 mg by mouth daily with breakfast.     [provider]  fluticasone (FLONASE) 50 MCG/ACT nasal spray Place 2 sprays into both nostrils daily.     [provider]  Fluticasone-Umeclidin-Vilant (TRELEGY ELLIPTA) 200-62.5-25 MCG/ACT AEPB INHALE 1 PUFF BY MOUTH INTO LUNGS ONCE DAILY 08/18/22   Hunsucker, Lesia Sago, MD  icosapent Ethyl (VASCEPA) 1 g capsule Take 2 capsules (2 g total) by mouth 2 (two) times daily. 04/17/23   Laurey Morale, MD  insulin aspart (NOVOLOG FLEXPEN)  100 UNIT/ML FlexPen INJECT 12 UNITS BEFORE MEALS, keep sugar TWO HOURS AFTER meals UNDER 180 AT least 04/17/23   Motwani, Komal, MD  insulin degludec (TRESIBA FLEXTOUCH) 100 UNIT/ML FlexTouch Pen INJECT 40 UNITS INTO THE SKIN ONCE DAILY 06/07/23   Motwani, Komal, MD  Insulin Pen Needle (B-D ULTRAFINE III SHORT PEN) 31G X 8 MM MISC Inject 1 each into the skin 3 (three) times daily before meals. Use as instructed 4X daily. 04/17/23   Altamese Wadley, MD  Lacosamide 150 MG TABS Take 1 tablet (150 mg total) by mouth 2 (two) times daily. 05/04/23   Windell Norfolk, MD  levETIRAcetam (KEPPRA) 500 MG tablet Take 1 tablet (500 mg total) by mouth 2 (two) times daily. 05/04/23   Windell Norfolk, MD  metFORMIN (GLUCOPHAGE-XR) 500 MG 24 hr tablet Take 4 tablets (2,000 mg total) by mouth daily. 04/17/23   Altamese [place], MD  metoprolol succinate (TOPROL-XL) 50 MG 24 hr tablet Take 1 tablet (50 mg total) by mouth daily. 04/17/23   Laurey Morale, MD  mirtazapine (REMERON) 15 MG  tablet Take 1 tablet (15 mg total) by mouth at bedtime. 04/17/23   Mozingo, Thereasa Solo, NP  omeprazole (PRILOSEC) 20 MG capsule Take 20 mg by mouth every morning. 05/29/20   [provider]  potassium chloride SA (KLOR-CON M) 20 MEQ tablet TAKE (2 TABLETS) IN THE MORNING, TAKE (1 TABLET) IN THE EVENING 06/09/23   Milford, Anderson Malta, FNP  prazosin (MINIPRESS) 2 MG capsule Take 2 capsules (4 mg total) by mouth at bedtime. Patient taking differently: Take 2 mg by mouth at bedtime. 05/14/23   Mozingo, Thereasa Solo, NP  pregabalin (LYRICA) 150 MG capsule Take 1 capsule (150 mg total) by mouth 3 (three) times daily. 05/04/23   Windell Norfolk, MD  repaglinide (PRANDIN) 2 MG tablet TAKE TWO TABLETS BY MOUTH TWICE DAILY BEFORE A meal Patient taking differently: Take 2 mg by mouth 2 (two) times daily before a meal. 10/04/22   Reather Littler, MD  spironolactone (ALDACTONE) 25 MG tablet Take 1 tablet (25 mg total) by mouth daily. 06/09/23   Milford, Anderson Malta, FNP  tirzepatide [facility]) 15 MG/0.5ML Pen INJECT 15 MG INTO THE SKIN ONCE A WEEK. 06/07/23   Motwani, Carin Hock, MD  torsemide (DEMADEX) 20 MG tablet Take 6 tablets (120 mg total) by mouth in the morning and at bedtime. 04/17/23   Laurey Morale, MD  traZODone (DESYREL) 100 MG tablet TAKE 2 TABLETS BY MOUTH AT BEDTIME. 06/08/23   Mozingo, Thereasa Solo, NP  venlafaxine XR (EFFEXOR-XR) 75 MG 24 hr capsule Take 3 capsules (225 mg total) by mouth daily with breakfast. 04/17/23   Mozingo, Thereasa Solo, NP    Allergies:   Allergies  Allergen Reactions   Vancomycin Other (See Comments)    "Red Man Syndrome" 02/02/17: possible cause for rash under both arms   Niacin And Related Other (See Comments)    Red man syndrome   Tubersol [Tuberculin, Ppd] Other (See Comments)    Reaction unknown   Doxycycline Rash and Other (See Comments)    Social History:  reports that he quit smoking about 7 years ago. His smoking use included  cigarettes. He started smoking about 51 years ago. He has a 44 pack-year smoking history. He has been exposed to tobacco smoke. He has never used smokeless tobacco. He reports current alcohol use. He reports that he does not use drugs.  Family History: Family History  Problem Relation Age of Onset  CAD Maternal Grandfather    Diabetes Other    Diabetes Mellitus II Neg Hx    Colon cancer Neg Hx    Esophageal cancer Neg Hx    Inflammatory bowel disease Neg Hx    Liver disease Neg Hx    Pancreatic cancer Neg Hx    Rectal cancer Neg Hx    Stomach cancer Neg Hx    Sleep apnea Neg Hx     Physical Exam: Vitals:   06/25/23 0146 06/25/23 0147 06/25/23 0148 06/25/23 0149  BP:   (!) 103/59   Pulse: 88 88 86 88  Resp: 16 16 14 15   Temp:      TempSrc:      SpO2: (!) 87% 90% (!) 89% 91%  Weight:      Height:        General: A&O x 3, extreme morbid obesity, chronically ill-appearing gentleman Eyes: Pink conjunctiva, no scleral icterus ENT: Moist oral mucosa, neck supple, no thyromegaly Lungs: CTA B/L, no wheeze, no crackles, no use of accessory muscles Cardiovascular: RRR, no regurgitation, no gallops, no murmurs. No carotid bruits, no JVD Abdomen: soft, positive BS, non-tender, obese abdomen GU: not examined Neuro: CN II - XII grossly intact, sensation decreased LLE, LUE, left side of face  Musculoskeletal: LLE strength 1/5,RLE 5/5, RUE 5/5, LUE 4.5/5 Skin: B/L LE ulcers, chronic 3+ B/L edema. Psych: appropriate patient  Labs on Admission:  Recent Labs    06/24/23 2107 06/24/23 2109 06/24/23 2155  NA 139 139 140  K 5.3* 4.3 3.1*  CL 103 102  --   CO2  --  25  --   GLUCOSE 80 82  --   BUN 44* 29*  --   CREATININE 1.30* 1.18  --   CALCIUM  --  9.6  --    Recent Labs    06/24/23 2109  AST 30  ALT 23  ALKPHOS 56  BILITOT 1.0  PROT 6.9  ALBUMIN 3.7    Recent Labs    06/24/23 2109 06/24/23 2155  WBC 9.0  --   NEUTROABS 5.8  --   HGB 14.0 13.3  HCT 43.8 39.0   MCV 90.5  --   PLT 265  --     Recent Labs    06/24/23 2328  TSH 1.561    Radiological Exams on Admission: MR BRAIN WO CONTRAST  Result Date: 06/25/2023 CLINICAL DATA:  Initial evaluation for neuro deficit, stroke suspected. EXAM: MRI HEAD WITHOUT CONTRAST TECHNIQUE: Multiplanar, multiecho pulse sequences of the brain and surrounding structures were obtained without intravenous contrast. COMPARISON:  Prior CTs from 06/24/2023. FINDINGS: Brain: Mild age-related cerebral volume loss. Few scattered subcentimeter foci of T2/FLAIR signal intensity seen involving the supratentorial cerebral white matter, most like related chronic microvascular ischemic disease, less than is typically seen for patient age. 5 mm focus of restricted diffusion seen involving the left cerebellum (series 5, image 57), consistent with a small acute ischemic nonhemorrhagic infarct. No other evidence for acute or subacute ischemia elsewhere. No acute or chronic intracranial blood products. No mass lesion, midline shift or mass effect. No hydrocephalus or extra-axial fluid collection. Pituitary gland suprasellar region within normal limits. Vascular: Major intracranial vascular flow voids are maintained. Skull and upper cervical spine: Craniocervical junction within normal limits. Bone marrow signal intensity normal. No scalp soft tissue abnormality. Sinuses/Orbits: Globes and orbital soft tissues within normal limits. Mild mucosal thickening present about the sphenoid ethmoidal sinuses. No mastoid effusion. Other: None. IMPRESSION: 1. 5 mm  acute ischemic nonhemorrhagic left cerebellar infarct. 2. Underlying mild age-related cerebral volume loss with mild chronic microvascular ischemic disease. Electronically Signed   By: Rise Mu M.D.   On: 06/25/2023 01:14   DG Chest Portable 1 View  Result Date: 06/24/2023 CLINICAL DATA:  Altered level of consciousness EXAM: PORTABLE CHEST 1 VIEW COMPARISON:  05/15/2023 FINDINGS:  Single frontal view of the chest demonstrates a stable cardiac silhouette. Chronic scarring at the lung bases. No acute airspace disease, effusion, or pneumothorax. No acute bony abnormalities. IMPRESSION: 1. Chronic bibasilar scarring.  No acute airspace disease. Electronically Signed   By: Sharlet Salina M.D.   On: 06/24/2023 22:52   CT ANGIO HEAD NECK W WO CM  Result Date: 06/24/2023 CLINICAL DATA:  Stroke/TIA. Determine embolic source. Left-sided facial droop. EXAM: CT ANGIOGRAPHY HEAD AND NECK WITH AND WITHOUT CONTRAST TECHNIQUE: Multidetector CT imaging of the head and neck was performed using the standard protocol during bolus administration of intravenous contrast. Multiplanar CT image reconstructions and MIPs were obtained to evaluate the vascular anatomy. Carotid stenosis measurements (when applicable) are obtained utilizing NASCET criteria, using the distal internal carotid diameter as the denominator. RADIATION DOSE REDUCTION: This exam was performed according to the departmental dose-optimization program which includes automated exposure control, adjustment of the mA and/or kV according to patient size and/or use of iterative reconstruction technique. CONTRAST:  75mL OMNIPAQUE IOHEXOL 350 MG/ML SOLN COMPARISON:  CT head without contrast 06/24/2023. CT angio head and neck 05/07/2023. FINDINGS: CTA NECK FINDINGS Aortic arch: Atherosclerotic calcifications are present at the aortic arch without significant stenosis relative to the distal vessel. A scratched at the left common carotid artery shares a common origin with the innominate artery. Right carotid system: The right common carotid artery is within normal limits. Atherosclerotic calcifications are present at the bifurcation without significant stenosis. Mild tortuosity is present in the cervical left ICA. Left carotid system: The left common carotid artery is within normal limits. Atherosclerotic calcifications are present at the bifurcation. No  significant stenosis is present. Mild tortuosity is present the cervical left ICA. Vertebral arteries: The left vertebral artery is the dominant vessel. Both vertebral arteries originate from the subclavian arteries without significant stenosis. Significant stenosis is present either vertebral artery in. Skeleton: Neck level degenerative changes are again noted in the cervical spine. No focal osseous lesions are present. Other neck: Soft tissues the neck are otherwise unremarkable. Salivary glands are within normal limits. Thyroid is normal. No significant adenopathy is present. No focal mucosal or submucosal lesions are present. Upper chest: Mild dependent atelectasis is present in the lungs bilaterally. Review of the MIP images confirms the above findings CTA HEAD FINDINGS Anterior circulation: The internal carotid arteries are within normal limits from the skull base to the ICA termini. The A1 and M1 segments are normal. The anterior communicating artery is patent. MCA bifurcations are normal. The ACA and MCA branch vessels are within normal limits. No aneurysm is present. Posterior circulation: Left vertebral artery is dominant vessel. The PICA origins are visualized and normal. The vertebrobasilar junction and basilar artery are normal. The superior cerebellar arteries are patent. Both posterior cerebral arteries originate from basilar tip. PCA branch vessels are normal bilaterally. Venous sinuses: The dural sinuses are patent. The straight sinus and deep cerebral veins are intact. Cortical veins are within normal limits. No significant vascular malformation is evident. Anatomic variants: None Review of the MIP images confirms the above findings IMPRESSION: 1. No emergent large vessel occlusion. 2. Atherosclerotic changes at  the carotid bifurcations bilaterally without significant stenosis. 3. Mild tortuosity of the cervical internal carotid arteries bilaterally likely reflects chronic hypertension. 4.  Degenerative changes in the cervical spine. 5.  Aortic Atherosclerosis (ICD10-I70.0). Electronically Signed   By: Marin Roberts M.D.   On: 06/24/2023 21:33   CT HEAD CODE STROKE WO CONTRAST  Result Date: 06/24/2023 CLINICAL DATA:  Code stroke.  Initial evaluation for acute stroke. EXAM: CT HEAD WITHOUT CONTRAST TECHNIQUE: Contiguous axial images were obtained from the base of the skull through the vertex without intravenous contrast. RADIATION DOSE REDUCTION: This exam was performed according to the departmental dose-optimization program which includes automated exposure control, adjustment of the mA and/or kV according to patient size and/or use of iterative reconstruction technique. COMPARISON:  Prior study from 05/07/2023 FINDINGS: Brain: Cerebral volume within normal limits. Mild chronic microvascular ischemic disease. No acute intracranial hemorrhage. No acute large vessel territory infarct. No mass lesion or midline shift. No hydrocephalus or extra-axial fluid collection. Vascular: No abnormal hyperdense vessel. Skull: Scalp soft tissues within normal limits.  Calvarium intact. Sinuses/Orbits: Globes and orbital soft tissues within normal limits. Mild mucosal thickening about the ethmoidal air cells. Paranasal sinuses are otherwise clear. No significant mastoid effusion. Other: None. ASPECTS [facility] Stroke Program Early CT Score) - Ganglionic level infarction (caudate, lentiform nuclei, internal capsule, insula, M1-M3 cortex): 7 - Supraganglionic infarction (M4-M6 cortex): 3 Total score (0-10 with 10 being normal): 10 IMPRESSION: 1. No acute intracranial abnormality. 2. Aspects is 10. 3. Mild chronic microvascular ischemic disease. These results were communicated to Dr. Wilford Corner at 9:14 pm on 06/24/2023 by text page via the [facility] messaging system. Electronically Signed   By: Rise Mu M.D.   On: 06/24/2023 21:15    Assessment/Plan Present on Admission:  CVA -CVA order set  initiated -Neurology Dr Jerrell Belfast has seen patient.  Input appreciated. -Maintain patient on Eliquis home dose, without changing.-2D echo with bubble study ordered.  Frequent neurochecks -Permissive hypertension -Lipid panel ordered, home statin resumed   Gouty flare -right great toe -Allopurinol resumed.  First dose now -Single IV dose Toradol ordered   Acute respiratory failure -Likely secondary to obesity hypoventilatory syndrome -Continue oxygen via nasal cannula   OSA -CPAP ordered   B/L lower extremity venous stasis ulcers -Wound care per nursing   Acute metabolic encephalopathy -Resolved   Chronic diastolic CHF (congestive heart failure) (HCC) -Spironolactone, Demadex on hold tonight.  Patient with borderline BP 103/59. -Eliquis, atorvastatin, fenofibrate   COPD (chronic obstructive pulmonary disease) (HCC) -Trelegy, Flonase, Zyrtec resumed   Depression -Abilify, BuSpar, venlafaxine resumed -Trazodone on hold tonight   Atrial fibrillation -Metoprolol on hold with permissive hypertension.  Careful for A-fib with RVR -Eliquis resumed -Monday Wednesday and Mondays   HTN -Permissive hypertension orders   T2DM //  peripheral neuropathy -Sign scale insulin -Neurontin on hold tonight   History of CVA with residual left-sided weakness  Lawrence [name] 06/25/2023, 2:14 AM

## 2023-06-25 NOTE — ED Notes (Signed)
ED TO INPATIENT HANDOFF REPORT  ED Nurse Name and Phone [phone]  S Name/Age/Gender [name] 67 y.o. male Room/Bed: 040C/040C  Code Status   Code Status: Do not attempt resuscitation (DNR) - Comfort care  Home/SNF/Other Skilled nursing facility Patient oriented to: self, place, time, and situation Is this baseline? Yes   Triage Complete: Triage complete  Chief Complaint Acute CVA (cerebrovascular accident) [facility]) [I63.9]  Triage Note Pt c/o left sided weakness, last known well at 17:30. Per EMS pt was hard to wake up after going to sleep in the recliner, upon waking pt had slurred speech, left side facial droop and feeling "fuzzy" headed. Pt took his Eliquis at 33.Pt has a h/o seizures.    Allergies Allergies  Allergen Reactions   Vancomycin Other (See Comments)    "Red Man Syndrome" 02/02/17: possible cause for rash under both arms   Niacin And Related Other (See Comments)    Red man syndrome   Tubersol [Tuberculin, Ppd] Other (See Comments)    Reaction unknown   Doxycycline Rash and Other (See Comments)    Level of Care/Admitting Diagnosis ED Disposition     ED Disposition  Admit   Condition  --   Comment  Hospital Area: [facility] [[id number]]  Level of Care: Telemetry Cardiac [[id number]]  May admit patient to [facility] or [facility] if equivalent level of care is available:: No  Covid Evaluation: Asymptomatic - no recent exposure (last 10 days) testing not required  Diagnosis: Acute CVA (cerebrovascular accident) [facility]) [[id number]]  Admitting Physician: Gery Pray [[id number]]  Attending Physician: Gery Pray [[id number]]  Certification:: I certify this patient will need inpatient services for at least 2 midnights  Expected Medical Readiness: 06/27/2023          B Medical/Surgery History Past Medical History:  Diagnosis Date   Acquired dilation of ascending aorta and aortic root (HCC)    40mm by echo 01/2021   Adenomatous colon polyp  2007   Anemia    Anxiety    Asthma    BPH without obstruction/lower urinary tract symptoms 02/22/2017   Chronic diastolic (congestive) heart failure (HCC)    Chronic venous stasis 03/07/2019   COPD (chronic obstructive pulmonary disease) (HCC)    Coronary artery calcification seen on CAT scan    Depression    Diabetic neuropathy (HCC) 09/11/2019   History of colon polyps 08/24/2018   Hypertension    Morbid obesity (HCC)    OSA (obstructive sleep apnea)    Pain due to onychomycosis of toenails of both feet 09/11/2019   Peripheral neuropathy 02/22/2017   Primary osteoarthritis, left shoulder 03/05/2017   PTSD (post-traumatic stress disorder)    Pure hypercholesterolemia    QT prolongation 03/07/2019   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 Lynnette Caffey and Dr. Laneta Simmers   Seizures [facility])    Severe aortic stenosis    Sinus tachycardia 03/07/2019   Sleep apnea    CPAP   Type 2 diabetes mellitus with vascular disease (HCC) 09/11/2019   Past Surgical History:  Procedure Laterality Date   ENDOVENOUS ABLATION SAPHENOUS VEIN W/ LASER Right 08/20/2020   endovenous laser ablation right greater saphenous vein by Cari Caraway MD    ENDOVENOUS ABLATION SAPHENOUS VEIN W/ LASER Left 11/16/2022   endovenous laser ablation left greater saphenous vein by Cari Caraway MD   INTRAOPERATIVE TRANSTHORACIC ECHOCARDIOGRAM N/A 02/07/2023   Procedure: INTRAOPERATIVE TRANSTHORACIC ECHOCARDIOGRAM;  Surgeon: Lynnette Caffey,  Charlies Constable, MD;  Location: [facility] INVASIVE CV LAB;  Service: Open Heart Surgery;  Laterality: N/A;   JOINT REPLACEMENT     left knee replacement x 2   KNEE ARTHROSCOPY Bilateral    LEFT HEART CATH AND CORONARY ANGIOGRAPHY N/A 01/17/2018   Procedure: LEFT HEART CATH AND CORONARY ANGIOGRAPHY;  Surgeon: Marykay Lex, MD;  Location: [facility] INVASIVE CV LAB;  Service: Cardiovascular;  Laterality: N/A;   PRESSURE SENSOR/CARDIOMEMS N/A 08/26/2021    Procedure: PRESSURE SENSOR/CARDIOMEMS;  Surgeon: Laurey Morale, MD;  Location: [facility] INVASIVE CV LAB;  Service: Cardiovascular;  Laterality: N/A;   RIGHT HEART CATH N/A 08/26/2021   Procedure: RIGHT HEART CATH;  Surgeon: Laurey Morale, MD;  Location: [facility] INVASIVE CV LAB;  Service: Cardiovascular;  Laterality: N/A;   RIGHT HEART CATH AND CORONARY ANGIOGRAPHY N/A 01/26/2023   Procedure: RIGHT HEART CATH AND CORONARY ANGIOGRAPHY;  Surgeon: Orbie Pyo, MD;  Location: [facility] INVASIVE CV LAB;  Service: Cardiovascular;  Laterality: N/A;   TEE WITHOUT CARDIOVERSION N/A 08/26/2021   Procedure: TRANSESOPHAGEAL ECHOCARDIOGRAM (TEE);  Surgeon: Laurey Morale, MD;  Location: [facility] ENDOSCOPY;  Service: Cardiovascular;  Laterality: N/A;   TOOTH EXTRACTION N/A 02/03/2023   Procedure: DENTAL RESTORATION/EXTRACTIONS;  Surgeon: [name], DMD;  Location: [facility] OR;  Service: Oral Surgery;  Laterality: N/A;   TRANSCATHETER AORTIC VALVE REPLACEMENT, TRANSFEMORAL Right 02/07/2023   Procedure: Transcatheter Aortic Valve Replacement, Transfemoral;  Surgeon: Orbie Pyo, MD;  Location: [facility] INVASIVE CV LAB;  Service: Open Heart Surgery;  Laterality: Right;   UMBILICAL HERNIA REPAIR       A IV Location/Drains/Wounds Patient Lines/Drains/Airways Status     Active Line/Drains/Airways     Name Placement date Placement time Site Days   Peripheral IV 06/24/23 20 G Left Hand 06/24/23  --  Hand  1   Wound / Incision (Open or Dehisced) 10/26/22 Venous stasis ulcer Leg Left 10/26/22  2314  Leg  242            Intake/Output Last 24 hours  Intake/Output Summary (Last 24 hours) at 06/25/2023 1510 Last data filed at 06/25/2023 0515 Gross per 24 hour  Intake --  Output 1400 ml  Net -1400 ml    Labs/Imaging Results for orders placed or performed during the hospital encounter of 06/24/23 (from the past 48 hour(s))  I-STAT, chem 8     Status: Abnormal   Collection Time: 06/24/23  9:07 PM  Result Value Ref Range    Sodium 139 135 - 145 mmol/L   Potassium 5.3 (H) 3.5 - 5.1 mmol/L   Chloride 103 98 - 111 mmol/L   BUN 44 (H) 8 - 23 mg/dL   Creatinine, Ser 5.62 (H) 0.61 - 1.24 mg/dL   Glucose, Bld 80 70 - 99 mg/dL    Comment: Glucose reference range applies only to samples taken after fasting for at least 8 hours.   Calcium, Ion 1.04 (L) 1.15 - 1.40 mmol/L   TCO2 29 22 - 32 mmol/L   Hemoglobin 15.0 13.0 - 17.0 g/dL   HCT 13.0 86.5 - 78.4 %  Ethanol     Status: None   Collection Time: 06/24/23  9:09 PM  Result Value Ref Range   Alcohol, Ethyl (B) <10 <10 mg/dL    Comment: (NOTE) Lowest detectable limit for serum alcohol is 10 mg/dL.  For medical purposes only. Performed at [facility], [address] [place], [place] [address]   Protime-INR     Status:  None   Collection Time: 06/24/23  9:09 PM  Result Value Ref Range   Prothrombin Time 13.1 11.4 - 15.2 seconds   INR 1.0 0.8 - 1.2    Comment: (NOTE) INR goal varies based on device and disease states. Performed at [facility], [address], [place], [place] [address]   APTT     Status: None   Collection Time: 06/24/23  9:09 PM  Result Value Ref Range   aPTT 29 24 - 36 seconds    Comment: Performed at [facility], [address] [place], [place] [address]  CBC     Status: Abnormal   Collection Time: 06/24/23  9:09 PM  Result Value Ref Range   WBC 9.0 4.0 - 10.5 K/uL   RBC 4.84 4.22 - 5.81 MIL/uL   Hemoglobin 14.0 13.0 - 17.0 g/dL   HCT 09.8 11.9 - 14.7 %   MCV 90.5 80.0 - 100.0 fL   MCH 28.9 26.0 - 34.0 pg   MCHC 32.0 30.0 - 36.0 g/dL   RDW 82.9 (H) 56.2 - 13.0 %   Platelets 265 150 - 400 K/uL   nRBC 0.0 0.0 - 0.2 %    Comment: Performed at [facility], [address], [place], [place] [address]  Differential     Status: None   Collection Time: 06/24/23  9:09 PM  Result Value Ref Range   Neutrophils Relative % 64 %   Neutro Abs 5.8 1.7 - 7.7 K/uL   Lymphocytes Relative 20 %   Lymphs Abs 1.8 0.7  - 4.0 K/uL   Monocytes Relative 9 %   Monocytes Absolute 0.8 0.1 - 1.0 K/uL   Eosinophils Relative 5 %   Eosinophils Absolute 0.5 0.0 - 0.5 K/uL   Basophils Relative 1 %   Basophils Absolute 0.1 0.0 - 0.1 K/uL   Immature Granulocytes 1 %   Abs Immature Granulocytes 0.07 0.00 - 0.07 K/uL    Comment: Performed at [facility], [address] [place], [place] [address]  Comprehensive metabolic panel     Status: Abnormal   Collection Time: 06/24/23  9:09 PM  Result Value Ref Range   Sodium 139 135 - 145 mmol/L   Potassium 4.3 3.5 - 5.1 mmol/L    Comment: HEMOLYSIS AT THIS LEVEL MAY AFFECT RESULT   Chloride 102 98 - 111 mmol/L   CO2 25 22 - 32 mmol/L   Glucose, Bld 82 70 - 99 mg/dL    Comment: Glucose reference range applies only to samples taken after fasting for at least 8 hours.   BUN 29 (H) 8 - 23 mg/dL   Creatinine, Ser 9.52 0.61 - 1.24 mg/dL   Calcium 9.6 8.9 - 84.1 mg/dL   Total Protein 6.9 6.5 - 8.1 g/dL   Albumin 3.7 3.5 - 5.0 g/dL   AST 30 15 - 41 U/L    Comment: HEMOLYSIS AT THIS LEVEL MAY AFFECT RESULT   ALT 23 0 - 44 U/L    Comment: HEMOLYSIS AT THIS LEVEL MAY AFFECT RESULT   Alkaline Phosphatase 56 38 - 126 U/L   Total Bilirubin 1.0 <1.2 mg/dL    Comment: HEMOLYSIS AT THIS LEVEL MAY AFFECT RESULT   GFR, Estimated >60 >60 mL/min    Comment: (NOTE) Calculated using the CKD-EPI Creatinine Equation (2021)    Anion gap 12 5 - 15    Comment: Performed at [facility], [address], [place], [place]  [address]  Urine rapid drug screen (hosp performed)     Status: None   Collection Time: 06/24/23  9:29 PM  Result Value Ref Range   Opiates NONE DETECTED NONE DETECTED   Cocaine NONE DETECTED NONE DETECTED   Benzodiazepines NONE DETECTED NONE DETECTED   Amphetamines NONE DETECTED NONE DETECTED   Tetrahydrocannabinol NONE DETECTED NONE DETECTED   Barbiturates NONE DETECTED NONE DETECTED    Comment: (NOTE) DRUG SCREEN FOR MEDICAL PURPOSES ONLY.  IF  CONFIRMATION IS NEEDED FOR ANY PURPOSE, NOTIFY LAB WITHIN 5 DAYS.  LOWEST DETECTABLE LIMITS FOR URINE DRUG SCREEN Drug Class                     Cutoff (ng/mL) Amphetamine and metabolites    1000 Barbiturate and metabolites    200 Benzodiazepine                 200 Opiates and metabolites        300 Cocaine and metabolites        300 THC                            50 Performed at [facility], [address], [place], [place] [address]   Urinalysis, Routine w reflex microscopic -Urine, Clean Catch     Status: Abnormal   Collection Time: 06/24/23  9:29 PM  Result Value Ref Range   Color, Urine COLORLESS (A) YELLOW   APPearance CLEAR CLEAR   Specific Gravity, Urine 1.009 1.005 - 1.030   pH 6.0 5.0 - 8.0   Glucose, UA 150 (A) NEGATIVE mg/dL   Hgb urine dipstick NEGATIVE NEGATIVE   Bilirubin Urine NEGATIVE NEGATIVE   Ketones, ur NEGATIVE NEGATIVE mg/dL   Protein, ur NEGATIVE NEGATIVE mg/dL   Nitrite NEGATIVE NEGATIVE   Leukocytes,Ua NEGATIVE NEGATIVE    Comment: Performed at [facility], [address] [place], [place] [address]  I-Stat arterial blood gas, ED     Status: Abnormal   Collection Time: 06/24/23  9:55 PM  Result Value Ref Range   pH, Arterial 7.408 7.35 - 7.45   pCO2 arterial 42.5 32 - 48 mmHg   pO2, Arterial 61 (L) 83 - 108 mmHg   Bicarbonate 27.0 20.0 - 28.0 mmol/L   TCO2 28 22 - 32 mmol/L   O2 Saturation 92 %   Acid-Base Excess 2.0 0.0 - 2.0 mmol/L   Sodium 140 135 - 145 mmol/L   Potassium 3.1 (L) 3.5 - 5.1 mmol/L   Calcium, Ion 1.26 1.15 - 1.40 mmol/L   HCT 39.0 39.0 - 52.0 %   Hemoglobin 13.3 13.0 - 17.0 g/dL   Patient temperature 91.4 F    Collection site RADIAL, ALLEN'S TEST ACCEPTABLE    Drawn by RT    Sample type ARTERIAL   TSH     Status: None   Collection Time: 06/24/23 11:28 PM  Result Value Ref Range   TSH 1.561 0.350 - 4.500 uIU/mL    Comment: Performed by a 3rd Generation assay with a functional sensitivity of <=0.01  uIU/mL. Performed at [facility], [address] [place], [place] [address]   CBG monitoring, ED     Status: Abnormal   Collection Time: 06/25/23  4:09 AM  Result Value Ref Range   Glucose-Capillary 106 (H) 70 - 99 mg/dL    Comment: Glucose reference range applies only to samples taken after fasting  for at least 8 hours.  CBC     Status: Abnormal   Collection Time: 06/25/23  4:26 AM  Result Value Ref Range   WBC 9.9 4.0 - 10.5 K/uL   RBC 4.44 4.22 - 5.81 MIL/uL   Hemoglobin 12.9 (L) 13.0 - 17.0 g/dL   HCT 16.1 09.6 - 04.5 %   MCV 91.4 80.0 - 100.0 fL   MCH 29.1 26.0 - 34.0 pg   MCHC 31.8 30.0 - 36.0 g/dL   RDW 40.9 (H) 81.1 - 91.4 %   Platelets 263 150 - 400 K/uL   nRBC 0.0 0.0 - 0.2 %    Comment: Performed at [facility] Lab, [address] [place], [place] [address]  Comprehensive metabolic panel     Status: Abnormal   Collection Time: 06/25/23  4:26 AM  Result Value Ref Range   Sodium 140 135 - 145 mmol/L   Potassium 3.2 (L) 3.5 - 5.1 mmol/L   Chloride 106 98 - 111 mmol/L   CO2 26 22 - 32 mmol/L   Glucose, Bld 109 (H) 70 - 99 mg/dL    Comment: Glucose reference range applies only to samples taken after fasting for at least 8 hours.   BUN 30 (H) 8 - 23 mg/dL   Creatinine, Ser 6.21 0.61 - 1.24 mg/dL   Calcium 8.9 8.9 - 30.8 mg/dL   Total Protein 6.4 (L) 6.5 - 8.1 g/dL   Albumin 3.3 (L) 3.5 - 5.0 g/dL   AST 15 15 - 41 U/L   ALT 18 0 - 44 U/L   Alkaline Phosphatase 45 38 - 126 U/L   Total Bilirubin 0.4 <1.2 mg/dL   GFR, Estimated >65 >78 mL/min    Comment: (NOTE) Calculated using the CKD-EPI Creatinine Equation (2021)    Anion gap 8 5 - 15    Comment: Performed at [facility], [address], [place], [place] [address]  Lipid panel     Status: Abnormal   Collection Time: 06/25/23  4:26 AM  Result Value Ref Range   Cholesterol 151 0 - 200 mg/dL   Triglycerides 952 (H) <150 mg/dL   HDL 30 (L) >84 mg/dL   Total CHOL/HDL Ratio 5.0 RATIO   VLDL 42 (H)  0 - 40 mg/dL   LDL Cholesterol 79 0 - 99 mg/dL    Comment:        Total Cholesterol/HDL:CHD Risk Coronary Heart Disease Risk Table                     Men   Women  1/2 Average Risk   3.4   3.3  Average Risk       5.0   4.4  2 X Average Risk   9.6   7.1  3 X Average Risk  23.4   11.0        Use the calculated Patient Ratio above and the CHD Risk Table to determine the patient's CHD Risk.        ATP III CLASSIFICATION (LDL):  <100     mg/dL   Optimal  132-440  mg/dL   Near or Above                    Optimal  130-159  mg/dL   Borderline  102-725  mg/dL   High  >366     mg/dL   Very High Performed at [facility], [address], [place], [place]  [address]   CBG monitoring, ED     Status: Abnormal   Collection Time: 06/25/23  8:39 AM  Result Value Ref Range   Glucose-Capillary 179 (H) 70 - 99 mg/dL    Comment: Glucose reference range applies only to samples taken after fasting for at least 8 hours.  CBG monitoring, ED     Status: Abnormal   Collection Time: 06/25/23 12:23 PM  Result Value Ref Range   Glucose-Capillary 121 (H) 70 - 99 mg/dL    Comment: Glucose reference range applies only to samples taken after fasting for at least 8 hours.   MR BRAIN WO CONTRAST  Result Date: 06/25/2023 CLINICAL DATA:  Initial evaluation for neuro deficit, stroke suspected. EXAM: MRI HEAD WITHOUT CONTRAST TECHNIQUE: Multiplanar, multiecho pulse sequences of the brain and surrounding structures were obtained without intravenous contrast. COMPARISON:  Prior CTs from 06/24/2023. FINDINGS: Brain: Mild age-related cerebral volume loss. Few scattered subcentimeter foci of T2/FLAIR signal intensity seen involving the supratentorial cerebral white matter, most like related chronic microvascular ischemic disease, less than is typically seen for patient age. 5 mm focus of restricted diffusion seen involving the left cerebellum (series 5, image 57), consistent with a small acute ischemic nonhemorrhagic  infarct. No other evidence for acute or subacute ischemia elsewhere. No acute or chronic intracranial blood products. No mass lesion, midline shift or mass effect. No hydrocephalus or extra-axial fluid collection. Pituitary gland suprasellar region within normal limits. Vascular: Major intracranial vascular flow voids are maintained. Skull and upper cervical spine: Craniocervical junction within normal limits. Bone marrow signal intensity normal. No scalp soft tissue abnormality. Sinuses/Orbits: Globes and orbital soft tissues within normal limits. Mild mucosal thickening present about the sphenoid ethmoidal sinuses. No mastoid effusion. Other: None. IMPRESSION: 1. 5 mm acute ischemic nonhemorrhagic left cerebellar infarct. 2. Underlying mild age-related cerebral volume loss with mild chronic microvascular ischemic disease. Electronically Signed   By: Rise Mu M.D.   On: 06/25/2023 01:14   DG Chest Portable 1 View  Result Date: 06/24/2023 CLINICAL DATA:  Altered level of consciousness EXAM: PORTABLE CHEST 1 VIEW COMPARISON:  05/15/2023 FINDINGS: Single frontal view of the chest demonstrates a stable cardiac silhouette. Chronic scarring at the lung bases. No acute airspace disease, effusion, or pneumothorax. No acute bony abnormalities. IMPRESSION: 1. Chronic bibasilar scarring.  No acute airspace disease. Electronically Signed   By: Sharlet Salina M.D.   On: 06/24/2023 22:52   CT ANGIO HEAD NECK W WO CM  Result Date: 06/24/2023 CLINICAL DATA:  Stroke/TIA. Determine embolic source. Left-sided facial droop. EXAM: CT ANGIOGRAPHY HEAD AND NECK WITH AND WITHOUT CONTRAST TECHNIQUE: Multidetector CT imaging of the head and neck was performed using the standard protocol during bolus administration of intravenous contrast. Multiplanar CT image reconstructions and MIPs were obtained to evaluate the vascular anatomy. Carotid stenosis measurements (when applicable) are obtained utilizing NASCET criteria,  using the distal internal carotid diameter as the denominator. RADIATION DOSE REDUCTION: This exam was performed according to the departmental dose-optimization program which includes automated exposure control, adjustment of the mA and/or kV according to patient size and/or use of iterative reconstruction technique. CONTRAST:  75mL OMNIPAQUE IOHEXOL 350 MG/ML SOLN COMPARISON:  CT head without contrast 06/24/2023. CT angio head and neck 05/07/2023. FINDINGS: CTA NECK FINDINGS Aortic arch: Atherosclerotic calcifications are present at the aortic arch without significant stenosis relative to the distal vessel. A scratched at the left common carotid artery shares a common origin with the innominate artery. Right carotid system: The right  common carotid artery is within normal limits. Atherosclerotic calcifications are present at the bifurcation without significant stenosis. Mild tortuosity is present in the cervical left ICA. Left carotid system: The left common carotid artery is within normal limits. Atherosclerotic calcifications are present at the bifurcation. No significant stenosis is present. Mild tortuosity is present the cervical left ICA. Vertebral arteries: The left vertebral artery is the dominant vessel. Both vertebral arteries originate from the subclavian arteries without significant stenosis. Significant stenosis is present either vertebral artery in. Skeleton: Neck level degenerative changes are again noted in the cervical spine. No focal osseous lesions are present. Other neck: Soft tissues the neck are otherwise unremarkable. Salivary glands are within normal limits. Thyroid is normal. No significant adenopathy is present. No focal mucosal or submucosal lesions are present. Upper chest: Mild dependent atelectasis is present in the lungs bilaterally. Review of the MIP images confirms the above findings CTA HEAD FINDINGS Anterior circulation: The internal carotid arteries are within normal limits from  the skull base to the ICA termini. The A1 and M1 segments are normal. The anterior communicating artery is patent. MCA bifurcations are normal. The ACA and MCA branch vessels are within normal limits. No aneurysm is present. Posterior circulation: Left vertebral artery is dominant vessel. The PICA origins are visualized and normal. The vertebrobasilar junction and basilar artery are normal. The superior cerebellar arteries are patent. Both posterior cerebral arteries originate from basilar tip. PCA branch vessels are normal bilaterally. Venous sinuses: The dural sinuses are patent. The straight sinus and deep cerebral veins are intact. Cortical veins are within normal limits. No significant vascular malformation is evident. Anatomic variants: None Review of the MIP images confirms the above findings IMPRESSION: 1. No emergent large vessel occlusion. 2. Atherosclerotic changes at the carotid bifurcations bilaterally without significant stenosis. 3. Mild tortuosity of the cervical internal carotid arteries bilaterally likely reflects chronic hypertension. 4. Degenerative changes in the cervical spine. 5.  Aortic Atherosclerosis (ICD10-I70.0). Electronically Signed   By: Marin Roberts M.D.   On: 06/24/2023 21:33   CT HEAD CODE STROKE WO CONTRAST  Result Date: 06/24/2023 CLINICAL DATA:  Code stroke.  Initial evaluation for acute stroke. EXAM: CT HEAD WITHOUT CONTRAST TECHNIQUE: Contiguous axial images were obtained from the base of the skull through the vertex without intravenous contrast. RADIATION DOSE REDUCTION: This exam was performed according to the departmental dose-optimization program which includes automated exposure control, adjustment of the mA and/or kV according to patient size and/or use of iterative reconstruction technique. COMPARISON:  Prior study from 05/07/2023 FINDINGS: Brain: Cerebral volume within normal limits. Mild chronic microvascular ischemic disease. No acute intracranial  hemorrhage. No acute large vessel territory infarct. No mass lesion or midline shift. No hydrocephalus or extra-axial fluid collection. Vascular: No abnormal hyperdense vessel. Skull: Scalp soft tissues within normal limits.  Calvarium intact. Sinuses/Orbits: Globes and orbital soft tissues within normal limits. Mild mucosal thickening about the ethmoidal air cells. Paranasal sinuses are otherwise clear. No significant mastoid effusion. Other: None. ASPECTS [facility] Stroke Program Early CT Score) - Ganglionic level infarction (caudate, lentiform nuclei, internal capsule, insula, M1-M3 cortex): 7 - Supraganglionic infarction (M4-M6 cortex): 3 Total score (0-10 with 10 being normal): 10 IMPRESSION: 1. No acute intracranial abnormality. 2. Aspects is 10. 3. Mild chronic microvascular ischemic disease. These results were communicated to Dr. Wilford Corner at 9:14 pm on 06/24/2023 by text page via the [facility] messaging system. Electronically Signed   By: Rise Mu M.D.   On: 06/24/2023 21:15  Pending Labs Unresulted Labs (From admission, onward)     Start     Ordered   06/26/23 0500  CBC with Differential/Platelet  Daily,   R      06/25/23 0725   06/26/23 0500  Comprehensive metabolic panel  Daily,   R      06/25/23 0725   06/26/23 0500  Phosphorus  Daily,   R      06/25/23 0725   06/26/23 0500  Magnesium  Daily,   R      06/25/23 0725   06/24/23 2139  Blood gas, arterial  Once,   R        06/24/23 2138            Vitals/Pain Today's Vitals   06/25/23 0605 06/25/23 0840 06/25/23 0907 06/25/23 1200  BP: (!) 91/56  102/63 125/78  Pulse: 88  84 85  Resp: 18  11 16   Temp:  97.6 F (36.4 C)    TempSrc:  Oral    SpO2: 93%  94% 93%  Weight:      Height:      PainSc:        Isolation Precautions No active isolations  Medications Medications  apixaban (ELIQUIS) tablet 5 mg (5 mg Oral Given 06/25/23 0404)  allopurinol (ZYLOPRIM) tablet 200 mg (200 mg Oral Given 06/25/23 0404)   ARIPiprazole (ABILIFY) tablet 5 mg (5 mg Oral Given 06/25/23 0900)  atorvastatin (LIPITOR) tablet 40 mg (40 mg Oral Given 06/25/23 0900)  busPIRone (BUSPAR) tablet 10 mg (10 mg Oral Given 06/25/23 0900)  fenofibrate tablet 160 mg (160 mg Oral Given 06/25/23 0900)  fluticasone furoate-vilanterol (BREO ELLIPTA) 200-25 MCG/ACT 1 puff (1 puff Inhalation Given 06/25/23 0900)    And  umeclidinium bromide (INCRUSE ELLIPTA) 62.5 MCG/ACT 1 puff (1 puff Inhalation Given 06/25/23 0900)  icosapent Ethyl (VASCEPA) 1 g capsule 2 g (2 g Oral Given 06/25/23 0900)  insulin glargine-yfgn (SEMGLEE) injection 40 Units (40 Units Subcutaneous Given 06/25/23 0900)  insulin aspart (novoLOG) injection 12 Units (12 Units Subcutaneous Not Given 06/25/23 1251)  lacosamide (VIMPAT) tablet 150 mg (150 mg Oral Given 06/25/23 0900)  levETIRAcetam (KEPPRA) tablet 500 mg (500 mg Oral Given 06/25/23 0900)  pregabalin (LYRICA) capsule 150 mg (150 mg Oral Given 06/25/23 0900)  venlafaxine XR (EFFEXOR-XR) 24 hr capsule 225 mg (225 mg Oral Given 06/25/23 0857)  torsemide (DEMADEX) tablet 120 mg (120 mg Oral Given 06/25/23 0857)   stroke: early stages of recovery book (has no administration in time range)  acetaminophen (TYLENOL) tablet 650 mg (has no administration in time range)    Or  acetaminophen (TYLENOL) 160 MG/5ML solution 650 mg (has no administration in time range)    Or  acetaminophen (TYLENOL) suppository 650 mg (has no administration in time range)  metoprolol tartrate (LOPRESSOR) injection 5 mg (has no administration in time range)  insulin aspart (novoLOG) injection 0-15 Units ( Subcutaneous Not Given 06/25/23 1231)  insulin aspart (novoLOG) injection 0-5 Units (has no administration in time range)  iohexol (OMNIPAQUE) 350 MG/ML injection 75 mL (75 mLs Intravenous Contrast Given 06/24/23 2119)  LORazepam (ATIVAN) injection 0.5 mg (0.5 mg Intravenous Given 06/24/23 2344)  ketorolac (TORADOL) 30 MG/ML injection 30  mg (30 mg Intravenous Given 06/25/23 0407)    Mobility non-ambulatory     Focused Assessments Neuro Assessment Handoff:  Swallow screen pass? yes Cardiac Rhythm: Normal sinus rhythm NIH Stroke Scale  Dizziness Present: No Headache Present: No Interval: Shift assessment Level of Consciousness (  1a.)   : Alert, keenly responsive LOC Questions (1b. )   : Answers both questions correctly LOC Commands (1c. )   : Performs both tasks correctly Best Gaze (2. )  : Normal Visual (3. )  : No visual loss Facial Palsy (4. )    : Normal symmetrical movements Motor Arm, Left (5a. )   : Drift Motor Arm, Right (5b. ) : No drift Motor Leg, Left (6a. )  : Some effort against gravity Motor Leg, Right (6b. ) : No drift Limb Ataxia (7. ): Absent Sensory (8. )  : Mild-to-moderate sensory loss, patient feels pinprick is less sharp or is dull on the affected side, or there is a loss of superficial pain with pinprick, but patient is aware of being touched Best Language (9. )  : No aphasia Dysarthria (10. ): Mild-to-moderate dysarthria, patient slurs at least some words and, at worst, can be understood with some difficulty Extinction/Inattention (11.)   : No Abnormality Complete NIHSS TOTAL: 5 Last date known well: 06/24/23 Last time known well: 1730 Neuro Assessment: Exceptions to [facility] Neuro Checks:   Initial (06/24/23 2122)  Has TPA been given? No If patient is a Neuro Trauma and patient is going to OR before floor call report to 4N Charge nurse: [phone] or [phone]   R Recommendations: See Admitting Provider Note  Report given to:   Additional Notes:

## 2023-06-25 NOTE — ED Notes (Signed)
ED TO INPATIENT HANDOFF REPORT  ED Nurse Name and Phone #: [phone]  S Name/Age/Gender Lawrence [name] 67 y.o. male Room/Bed: 040C/040C  Code Status   Code Status: Do not attempt resuscitation (DNR) - Comfort care  Home/SNF/Other Home Patient oriented to: self, place, time, and situation Is this baseline? Yes   Triage Complete: Triage complete  Chief Complaint Acute CVA (cerebrovascular accident) [facility]) [I63.9]  Triage Note Pt c/o left sided weakness, last known well at 17:30. Per EMS pt was hard to wake up after going to sleep in the recliner, upon waking pt had slurred speech, left side facial droop and feeling "fuzzy" headed. Pt took his Eliquis at 38.Pt has a h/o seizures.    Allergies Allergies  Allergen Reactions   Vancomycin Other (See Comments)    "Red Man Syndrome" 02/02/17: possible cause for rash under both arms   Niacin And Related Other (See Comments)    Red man syndrome   Tubersol [Tuberculin, Ppd] Other (See Comments)    Reaction unknown   Doxycycline Rash and Other (See Comments)    Level of Care/Admitting Diagnosis ED Disposition     ED Disposition  Admit   Condition  --   Comment  Hospital Area: [facility] [[id number]]  Level of Care: Telemetry Cardiac [[id number]]  May admit patient to [facility] or [facility] if equivalent level of care is available:: No  Covid Evaluation: Asymptomatic - no recent exposure (last 10 days) testing not required  Diagnosis: Acute CVA (cerebrovascular accident) [facility]) [[id number]]  Admitting Physician: Gery Pray [[id number]]  Attending Physician: Gery Pray [[id number]]  Certification:: I certify this patient will need inpatient services for at least 2 midnights  Expected Medical Readiness: 06/27/2023          B Medical/Surgery History Past Medical History:  Diagnosis Date   Acquired dilation of ascending aorta and aortic root (HCC)    40mm by echo 01/2021   Adenomatous colon polyp 2007   Anemia     Anxiety    Asthma    BPH without obstruction/lower urinary tract symptoms 02/22/2017   Chronic diastolic (congestive) heart failure (HCC)    Chronic venous stasis 03/07/2019   COPD (chronic obstructive pulmonary disease) (HCC)    Coronary artery calcification seen on CAT scan    Depression    Diabetic neuropathy (HCC) 09/11/2019   History of colon polyps 08/24/2018   Hypertension    Morbid obesity (HCC)    OSA (obstructive sleep apnea)    Pain due to onychomycosis of toenails of both feet 09/11/2019   Peripheral neuropathy 02/22/2017   Primary osteoarthritis, left shoulder 03/05/2017   PTSD (post-traumatic stress disorder)    Pure hypercholesterolemia    QT prolongation 03/07/2019   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 Lynnette Caffey and Dr. Laneta Simmers   Seizures [facility])    Severe aortic stenosis    Sinus tachycardia 03/07/2019   Sleep apnea    CPAP   Type 2 diabetes mellitus with vascular disease (HCC) 09/11/2019   Past Surgical History:  Procedure Laterality Date   ENDOVENOUS ABLATION SAPHENOUS VEIN W/ LASER Right 08/20/2020   endovenous laser ablation right greater saphenous vein by Cari Caraway MD    ENDOVENOUS ABLATION SAPHENOUS VEIN W/ LASER Left 11/16/2022   endovenous laser ablation left greater saphenous vein by Cari Caraway MD   INTRAOPERATIVE TRANSTHORACIC ECHOCARDIOGRAM N/A 02/07/2023   Procedure: INTRAOPERATIVE TRANSTHORACIC ECHOCARDIOGRAM;  Surgeon: Alverda Skeans  K, MD;  Location: [facility] INVASIVE CV LAB;  Service: Open Heart Surgery;  Laterality: N/A;   JOINT REPLACEMENT     left knee replacement x 2   KNEE ARTHROSCOPY Bilateral    LEFT HEART CATH AND CORONARY ANGIOGRAPHY N/A 01/17/2018   Procedure: LEFT HEART CATH AND CORONARY ANGIOGRAPHY;  Surgeon: Marykay Lex, MD;  Location: [facility] INVASIVE CV LAB;  Service: Cardiovascular;  Laterality: N/A;   PRESSURE SENSOR/CARDIOMEMS N/A 08/26/2021   Procedure: PRESSURE  SENSOR/CARDIOMEMS;  Surgeon: Laurey Morale, MD;  Location: [facility] INVASIVE CV LAB;  Service: Cardiovascular;  Laterality: N/A;   RIGHT HEART CATH N/A 08/26/2021   Procedure: RIGHT HEART CATH;  Surgeon: Laurey Morale, MD;  Location: [facility] INVASIVE CV LAB;  Service: Cardiovascular;  Laterality: N/A;   RIGHT HEART CATH AND CORONARY ANGIOGRAPHY N/A 01/26/2023   Procedure: RIGHT HEART CATH AND CORONARY ANGIOGRAPHY;  Surgeon: Orbie Pyo, MD;  Location: [facility] INVASIVE CV LAB;  Service: Cardiovascular;  Laterality: N/A;   TEE WITHOUT CARDIOVERSION N/A 08/26/2021   Procedure: TRANSESOPHAGEAL ECHOCARDIOGRAM (TEE);  Surgeon: Laurey Morale, MD;  Location: [facility] ENDOSCOPY;  Service: Cardiovascular;  Laterality: N/A;   TOOTH EXTRACTION N/A 02/03/2023   Procedure: DENTAL RESTORATION/EXTRACTIONS;  Surgeon: [name], DMD;  Location: [facility] OR;  Service: Oral Surgery;  Laterality: N/A;   TRANSCATHETER AORTIC VALVE REPLACEMENT, TRANSFEMORAL Right 02/07/2023   Procedure: Transcatheter Aortic Valve Replacement, Transfemoral;  Surgeon: Orbie Pyo, MD;  Location: [facility] INVASIVE CV LAB;  Service: Open Heart Surgery;  Laterality: Right;   UMBILICAL HERNIA REPAIR       A IV Location/Drains/Wounds Patient Lines/Drains/Airways Status     Active Line/Drains/Airways     Name Placement date Placement time Site Days   Peripheral IV 06/24/23 20 G Left Hand 06/24/23  --  Hand  1   Wound / Incision (Open or Dehisced) 10/26/22 Venous stasis ulcer Leg Left 10/26/22  2314  Leg  242            Intake/Output Last 24 hours  Intake/Output Summary (Last 24 hours) at 06/25/2023 1434 Last data filed at 06/25/2023 0515 Gross per 24 hour  Intake --  Output 1400 ml  Net -1400 ml    Labs/Imaging Results for orders placed or performed during the hospital encounter of 06/24/23 (from the past 48 hour(s))  I-STAT, chem 8     Status: Abnormal   Collection Time: 06/24/23  9:07 PM  Result Value Ref Range   Sodium 139 135 -  145 mmol/L   Potassium 5.3 (H) 3.5 - 5.1 mmol/L   Chloride 103 98 - 111 mmol/L   BUN 44 (H) 8 - 23 mg/dL   Creatinine, Ser 1.19 (H) 0.61 - 1.24 mg/dL   Glucose, Bld 80 70 - 99 mg/dL    Comment: Glucose reference range applies only to samples taken after fasting for at least 8 hours.   Calcium, Ion 1.04 (L) 1.15 - 1.40 mmol/L   TCO2 29 22 - 32 mmol/L   Hemoglobin 15.0 13.0 - 17.0 g/dL   HCT 14.7 82.9 - 56.2 %  Ethanol     Status: None   Collection Time: 06/24/23  9:09 PM  Result Value Ref Range   Alcohol, Ethyl (B) <10 <10 mg/dL    Comment: (NOTE) Lowest detectable limit for serum alcohol is 10 mg/dL.  For medical purposes only. Performed at [facility], [address] [place], [place] [address]   Protime-INR     Status: None  Collection Time: 06/24/23  9:09 PM  Result Value Ref Range   Prothrombin Time 13.1 11.4 - 15.2 seconds   INR 1.0 0.8 - 1.2    Comment: (NOTE) INR goal varies based on device and disease states. Performed at [facility], [address] [place], [place] [address]   APTT     Status: None   Collection Time: 06/24/23  9:09 PM  Result Value Ref Range   aPTT 29 24 - 36 seconds    Comment: Performed at [facility], [address] [place], [place] [address]  CBC     Status: Abnormal   Collection Time: 06/24/23  9:09 PM  Result Value Ref Range   WBC 9.0 4.0 - 10.5 K/uL   RBC 4.84 4.22 - 5.81 MIL/uL   Hemoglobin 14.0 13.0 - 17.0 g/dL   HCT 09.8 11.9 - 14.7 %   MCV 90.5 80.0 - 100.0 fL   MCH 28.9 26.0 - 34.0 pg   MCHC 32.0 30.0 - 36.0 g/dL   RDW 82.9 (H) 56.2 - 13.0 %   Platelets 265 150 - 400 K/uL   nRBC 0.0 0.0 - 0.2 %    Comment: Performed at [facility], [address], [place], [place] [address]  Differential     Status: None   Collection Time: 06/24/23  9:09 PM  Result Value Ref Range   Neutrophils Relative % 64 %   Neutro Abs 5.8 1.7 - 7.7 K/uL   Lymphocytes Relative 20 %   Lymphs Abs 1.8 0.7 - 4.0 K/uL    Monocytes Relative 9 %   Monocytes Absolute 0.8 0.1 - 1.0 K/uL   Eosinophils Relative 5 %   Eosinophils Absolute 0.5 0.0 - 0.5 K/uL   Basophils Relative 1 %   Basophils Absolute 0.1 0.0 - 0.1 K/uL   Immature Granulocytes 1 %   Abs Immature Granulocytes 0.07 0.00 - 0.07 K/uL    Comment: Performed at [facility], [address] [place], [place] [address]  Comprehensive metabolic panel     Status: Abnormal   Collection Time: 06/24/23  9:09 PM  Result Value Ref Range   Sodium 139 135 - 145 mmol/L   Potassium 4.3 3.5 - 5.1 mmol/L    Comment: HEMOLYSIS AT THIS LEVEL MAY AFFECT RESULT   Chloride 102 98 - 111 mmol/L   CO2 25 22 - 32 mmol/L   Glucose, Bld 82 70 - 99 mg/dL    Comment: Glucose reference range applies only to samples taken after fasting for at least 8 hours.   BUN 29 (H) 8 - 23 mg/dL   Creatinine, Ser 9.52 0.61 - 1.24 mg/dL   Calcium 9.6 8.9 - 84.1 mg/dL   Total Protein 6.9 6.5 - 8.1 g/dL   Albumin 3.7 3.5 - 5.0 g/dL   AST 30 15 - 41 U/L    Comment: HEMOLYSIS AT THIS LEVEL MAY AFFECT RESULT   ALT 23 0 - 44 U/L    Comment: HEMOLYSIS AT THIS LEVEL MAY AFFECT RESULT   Alkaline Phosphatase 56 38 - 126 U/L   Total Bilirubin 1.0 <1.2 mg/dL    Comment: HEMOLYSIS AT THIS LEVEL MAY AFFECT RESULT   GFR, Estimated >60 >60 mL/min    Comment: (NOTE) Calculated using the CKD-EPI Creatinine Equation (2021)    Anion gap 12 5 - 15    Comment: Performed at [facility], [address], [place], [place] [address]  Urine  rapid drug screen (hosp performed)     Status: None   Collection Time: 06/24/23  9:29 PM  Result Value Ref Range   Opiates NONE DETECTED NONE DETECTED   Cocaine NONE DETECTED NONE DETECTED   Benzodiazepines NONE DETECTED NONE DETECTED   Amphetamines NONE DETECTED NONE DETECTED   Tetrahydrocannabinol NONE DETECTED NONE DETECTED   Barbiturates NONE DETECTED NONE DETECTED    Comment: (NOTE) DRUG SCREEN FOR MEDICAL PURPOSES ONLY.  IF CONFIRMATION IS  NEEDED FOR ANY PURPOSE, NOTIFY LAB WITHIN 5 DAYS.  LOWEST DETECTABLE LIMITS FOR URINE DRUG SCREEN Drug Class                     Cutoff (ng/mL) Amphetamine and metabolites    1000 Barbiturate and metabolites    200 Benzodiazepine                 200 Opiates and metabolites        300 Cocaine and metabolites        300 THC                            50 Performed at [facility], [address], [place], [place] [address]   Urinalysis, Routine w reflex microscopic -Urine, Clean Catch     Status: Abnormal   Collection Time: 06/24/23  9:29 PM  Result Value Ref Range   Color, Urine COLORLESS (A) YELLOW   APPearance CLEAR CLEAR   Specific Gravity, Urine 1.009 1.005 - 1.030   pH 6.0 5.0 - 8.0   Glucose, UA 150 (A) NEGATIVE mg/dL   Hgb urine dipstick NEGATIVE NEGATIVE   Bilirubin Urine NEGATIVE NEGATIVE   Ketones, ur NEGATIVE NEGATIVE mg/dL   Protein, ur NEGATIVE NEGATIVE mg/dL   Nitrite NEGATIVE NEGATIVE   Leukocytes,Ua NEGATIVE NEGATIVE    Comment: Performed at [facility], [address], [place], [place] [address]  I-Stat arterial blood gas, ED     Status: Abnormal   Collection Time: 06/24/23  9:55 PM  Result Value Ref Range   pH, Arterial 7.408 7.35 - 7.45   pCO2 arterial 42.5 32 - 48 mmHg   pO2, Arterial 61 (L) 83 - 108 mmHg   Bicarbonate 27.0 20.0 - 28.0 mmol/L   TCO2 28 22 - 32 mmol/L   O2 Saturation 92 %   Acid-Base Excess 2.0 0.0 - 2.0 mmol/L   Sodium 140 135 - 145 mmol/L   Potassium 3.1 (L) 3.5 - 5.1 mmol/L   Calcium, Ion 1.26 1.15 - 1.40 mmol/L   HCT 39.0 39.0 - 52.0 %   Hemoglobin 13.3 13.0 - 17.0 g/dL   Patient temperature 01.0 F    Collection site RADIAL, ALLEN'S TEST ACCEPTABLE    Drawn by RT    Sample type ARTERIAL   TSH     Status: None   Collection Time: 06/24/23 11:28 PM  Result Value Ref Range   TSH 1.561 0.350 - 4.500 uIU/mL    Comment: Performed by a 3rd Generation assay with a functional sensitivity of <=0.01 uIU/mL. Performed at  [facility], [address] [place], [place] [address]   CBG monitoring, ED     Status: Abnormal   Collection Time: 06/25/23  4:09 AM  Result Value Ref Range   Glucose-Capillary 106 (H) 70 - 99 mg/dL    Comment: Glucose reference range applies only to samples taken after fasting for at least  8 hours.  CBC     Status: Abnormal   Collection Time: 06/25/23  4:26 AM  Result Value Ref Range   WBC 9.9 4.0 - 10.5 K/uL   RBC 4.44 4.22 - 5.81 MIL/uL   Hemoglobin 12.9 (L) 13.0 - 17.0 g/dL   HCT 62.9 52.8 - 41.3 %   MCV 91.4 80.0 - 100.0 fL   MCH 29.1 26.0 - 34.0 pg   MCHC 31.8 30.0 - 36.0 g/dL   RDW 24.4 (H) 01.0 - 27.2 %   Platelets 263 150 - 400 K/uL   nRBC 0.0 0.0 - 0.2 %    Comment: Performed at [facility], [address], [place], [place] [address]  Comprehensive metabolic panel     Status: Abnormal   Collection Time: 06/25/23  4:26 AM  Result Value Ref Range   Sodium 140 135 - 145 mmol/L   Potassium 3.2 (L) 3.5 - 5.1 mmol/L   Chloride 106 98 - 111 mmol/L   CO2 26 22 - 32 mmol/L   Glucose, Bld 109 (H) 70 - 99 mg/dL    Comment: Glucose reference range applies only to samples taken after fasting for at least 8 hours.   BUN 30 (H) 8 - 23 mg/dL   Creatinine, Ser 4.03 0.61 - 1.24 mg/dL   Calcium 8.9 8.9 - 47.4 mg/dL   Total Protein 6.4 (L) 6.5 - 8.1 g/dL   Albumin 3.3 (L) 3.5 - 5.0 g/dL   AST 15 15 - 41 U/L   ALT 18 0 - 44 U/L   Alkaline Phosphatase 45 38 - 126 U/L   Total Bilirubin 0.4 <1.2 mg/dL   GFR, Estimated >25 >95 mL/min    Comment: (NOTE) Calculated using the CKD-EPI Creatinine Equation (2021)    Anion gap 8 5 - 15    Comment: Performed at [facility], [address] [place], [place] [address]  Lipid panel     Status: Abnormal   Collection Time: 06/25/23  4:26 AM  Result Value Ref Range   Cholesterol 151 0 - 200 mg/dL   Triglycerides 643 (H) <150 mg/dL   HDL 30 (L) >32 mg/dL   Total CHOL/HDL Ratio 5.0 RATIO   VLDL 42 (H) 0 - 40 mg/dL   LDL  Cholesterol 79 0 - 99 mg/dL    Comment:        Total Cholesterol/HDL:CHD Risk Coronary Heart Disease Risk Table                     Men   Women  1/2 Average Risk   3.4   3.3  Average Risk       5.0   4.4  2 X Average Risk   9.6   7.1  3 X Average Risk  23.4   11.0        Use the calculated Patient Ratio above and the CHD Risk Table to determine the patient's CHD Risk.        ATP III CLASSIFICATION (LDL):  <100     mg/dL   Optimal  951-884  mg/dL   Near or Above                    Optimal  130-159  mg/dL   Borderline  166-063  mg/dL   High  >016     mg/dL   Very High Performed at [facility], [address], [place], [place] [address]  CBG monitoring, ED     Status: Abnormal   Collection Time: 06/25/23  8:39 AM  Result Value Ref Range   Glucose-Capillary 179 (H) 70 - 99 mg/dL    Comment: Glucose reference range applies only to samples taken after fasting for at least 8 hours.  CBG monitoring, ED     Status: Abnormal   Collection Time: 06/25/23 12:23 PM  Result Value Ref Range   Glucose-Capillary 121 (H) 70 - 99 mg/dL    Comment: Glucose reference range applies only to samples taken after fasting for at least 8 hours.   MR BRAIN WO CONTRAST  Result Date: 06/25/2023 CLINICAL DATA:  Initial evaluation for neuro deficit, stroke suspected. EXAM: MRI HEAD WITHOUT CONTRAST TECHNIQUE: Multiplanar, multiecho pulse sequences of the brain and surrounding structures were obtained without intravenous contrast. COMPARISON:  Prior CTs from 06/24/2023. FINDINGS: Brain: Mild age-related cerebral volume loss. Few scattered subcentimeter foci of T2/FLAIR signal intensity seen involving the supratentorial cerebral white matter, most like related chronic microvascular ischemic disease, less than is typically seen for patient age. 5 mm focus of restricted diffusion seen involving the left cerebellum (series 5, image 57), consistent with a small acute ischemic nonhemorrhagic infarct. No other  evidence for acute or subacute ischemia elsewhere. No acute or chronic intracranial blood products. No mass lesion, midline shift or mass effect. No hydrocephalus or extra-axial fluid collection. Pituitary gland suprasellar region within normal limits. Vascular: Major intracranial vascular flow voids are maintained. Skull and upper cervical spine: Craniocervical junction within normal limits. Bone marrow signal intensity normal. No scalp soft tissue abnormality. Sinuses/Orbits: Globes and orbital soft tissues within normal limits. Mild mucosal thickening present about the sphenoid ethmoidal sinuses. No mastoid effusion. Other: None. IMPRESSION: 1. 5 mm acute ischemic nonhemorrhagic left cerebellar infarct. 2. Underlying mild age-related cerebral volume loss with mild chronic microvascular ischemic disease. Electronically Signed   By: Rise Mu M.D.   On: 06/25/2023 01:14   DG Chest Portable 1 View  Result Date: 06/24/2023 CLINICAL DATA:  Altered level of consciousness EXAM: PORTABLE CHEST 1 VIEW COMPARISON:  05/15/2023 FINDINGS: Single frontal view of the chest demonstrates a stable cardiac silhouette. Chronic scarring at the lung bases. No acute airspace disease, effusion, or pneumothorax. No acute bony abnormalities. IMPRESSION: 1. Chronic bibasilar scarring.  No acute airspace disease. Electronically Signed   By: Sharlet Salina M.D.   On: 06/24/2023 22:52   CT ANGIO HEAD NECK W WO CM  Result Date: 06/24/2023 CLINICAL DATA:  Stroke/TIA. Determine embolic source. Left-sided facial droop. EXAM: CT ANGIOGRAPHY HEAD AND NECK WITH AND WITHOUT CONTRAST TECHNIQUE: Multidetector CT imaging of the head and neck was performed using the standard protocol during bolus administration of intravenous contrast. Multiplanar CT image reconstructions and MIPs were obtained to evaluate the vascular anatomy. Carotid stenosis measurements (when applicable) are obtained utilizing NASCET criteria, using the distal  internal carotid diameter as the denominator. RADIATION DOSE REDUCTION: This exam was performed according to the departmental dose-optimization program which includes automated exposure control, adjustment of the mA and/or kV according to patient size and/or use of iterative reconstruction technique. CONTRAST:  75mL OMNIPAQUE IOHEXOL 350 MG/ML SOLN COMPARISON:  CT head without contrast 06/24/2023. CT angio head and neck 05/07/2023. FINDINGS: CTA NECK FINDINGS Aortic arch: Atherosclerotic calcifications are present at the aortic arch without significant stenosis relative to the distal vessel. A scratched at the left common carotid artery shares a common origin with the innominate artery. Right carotid system: The right common carotid artery  is within normal limits. Atherosclerotic calcifications are present at the bifurcation without significant stenosis. Mild tortuosity is present in the cervical left ICA. Left carotid system: The left common carotid artery is within normal limits. Atherosclerotic calcifications are present at the bifurcation. No significant stenosis is present. Mild tortuosity is present the cervical left ICA. Vertebral arteries: The left vertebral artery is the dominant vessel. Both vertebral arteries originate from the subclavian arteries without significant stenosis. Significant stenosis is present either vertebral artery in. Skeleton: Neck level degenerative changes are again noted in the cervical spine. No focal osseous lesions are present. Other neck: Soft tissues the neck are otherwise unremarkable. Salivary glands are within normal limits. Thyroid is normal. No significant adenopathy is present. No focal mucosal or submucosal lesions are present. Upper chest: Mild dependent atelectasis is present in the lungs bilaterally. Review of the MIP images confirms the above findings CTA HEAD FINDINGS Anterior circulation: The internal carotid arteries are within normal limits from the skull base to  the ICA termini. The A1 and M1 segments are normal. The anterior communicating artery is patent. MCA bifurcations are normal. The ACA and MCA branch vessels are within normal limits. No aneurysm is present. Posterior circulation: Left vertebral artery is dominant vessel. The PICA origins are visualized and normal. The vertebrobasilar junction and basilar artery are normal. The superior cerebellar arteries are patent. Both posterior cerebral arteries originate from basilar tip. PCA branch vessels are normal bilaterally. Venous sinuses: The dural sinuses are patent. The straight sinus and deep cerebral veins are intact. Cortical veins are within normal limits. No significant vascular malformation is evident. Anatomic variants: None Review of the MIP images confirms the above findings IMPRESSION: 1. No emergent large vessel occlusion. 2. Atherosclerotic changes at the carotid bifurcations bilaterally without significant stenosis. 3. Mild tortuosity of the cervical internal carotid arteries bilaterally likely reflects chronic hypertension. 4. Degenerative changes in the cervical spine. 5.  Aortic Atherosclerosis (ICD10-I70.0). Electronically Signed   By: Marin Roberts M.D.   On: 06/24/2023 21:33   CT HEAD CODE STROKE WO CONTRAST  Result Date: 06/24/2023 CLINICAL DATA:  Code stroke.  Initial evaluation for acute stroke. EXAM: CT HEAD WITHOUT CONTRAST TECHNIQUE: Contiguous axial images were obtained from the base of the skull through the vertex without intravenous contrast. RADIATION DOSE REDUCTION: This exam was performed according to the departmental dose-optimization program which includes automated exposure control, adjustment of the mA and/or kV according to patient size and/or use of iterative reconstruction technique. COMPARISON:  Prior study from 05/07/2023 FINDINGS: Brain: Cerebral volume within normal limits. Mild chronic microvascular ischemic disease. No acute intracranial hemorrhage. No acute  large vessel territory infarct. No mass lesion or midline shift. No hydrocephalus or extra-axial fluid collection. Vascular: No abnormal hyperdense vessel. Skull: Scalp soft tissues within normal limits.  Calvarium intact. Sinuses/Orbits: Globes and orbital soft tissues within normal limits. Mild mucosal thickening about the ethmoidal air cells. Paranasal sinuses are otherwise clear. No significant mastoid effusion. Other: None. ASPECTS [facility] Stroke Program Early CT Score) - Ganglionic level infarction (caudate, lentiform nuclei, internal capsule, insula, M1-M3 cortex): 7 - Supraganglionic infarction (M4-M6 cortex): 3 Total score (0-10 with 10 being normal): 10 IMPRESSION: 1. No acute intracranial abnormality. 2. Aspects is 10. 3. Mild chronic microvascular ischemic disease. These results were communicated to Dr. Wilford Corner at 9:14 pm on 06/24/2023 by text page via the [employer] messaging system. Electronically Signed   By: Rise Mu M.D.   On: 06/24/2023 21:15    Pending Labs  Unresulted Labs (From admission, onward)     Start     Ordered   06/26/23 0500  CBC with Differential/Platelet  Daily,   R      06/25/23 0725   06/26/23 0500  Comprehensive metabolic panel  Daily,   R      06/25/23 0725   06/26/23 0500  Phosphorus  Daily,   R      06/25/23 0725   06/26/23 0500  Magnesium  Daily,   R      06/25/23 0725   06/24/23 2139  Blood gas, arterial  Once,   R        06/24/23 2138            Vitals/Pain Today's Vitals   06/25/23 0605 06/25/23 0840 06/25/23 0907 06/25/23 1200  BP: (!) 91/56  102/63 125/78  Pulse: 88  84 85  Resp: 18  11 16   Temp:  97.6 F (36.4 C)    TempSrc:  Oral    SpO2: 93%  94% 93%  Weight:      Height:      PainSc:        Isolation Precautions No active isolations  Medications Medications  apixaban (ELIQUIS) tablet 5 mg (5 mg Oral Given 06/25/23 0404)  allopurinol (ZYLOPRIM) tablet 200 mg (200 mg Oral Given 06/25/23 0404)  ARIPiprazole (ABILIFY)  tablet 5 mg (5 mg Oral Given 06/25/23 0900)  atorvastatin (LIPITOR) tablet 40 mg (40 mg Oral Given 06/25/23 0900)  busPIRone (BUSPAR) tablet 10 mg (10 mg Oral Given 06/25/23 0900)  fenofibrate tablet 160 mg (160 mg Oral Given 06/25/23 0900)  fluticasone furoate-vilanterol (BREO ELLIPTA) 200-25 MCG/ACT 1 puff (1 puff Inhalation Given 06/25/23 0900)    And  umeclidinium bromide (INCRUSE ELLIPTA) 62.5 MCG/ACT 1 puff (1 puff Inhalation Given 06/25/23 0900)  icosapent Ethyl (VASCEPA) 1 g capsule 2 g (2 g Oral Given 06/25/23 0900)  insulin glargine-yfgn (SEMGLEE) injection 40 Units (40 Units Subcutaneous Given 06/25/23 0900)  insulin aspart (novoLOG) injection 12 Units (12 Units Subcutaneous Not Given 06/25/23 1251)  lacosamide (VIMPAT) tablet 150 mg (150 mg Oral Given 06/25/23 0900)  levETIRAcetam (KEPPRA) tablet 500 mg (500 mg Oral Given 06/25/23 0900)  pregabalin (LYRICA) capsule 150 mg (150 mg Oral Given 06/25/23 0900)  venlafaxine XR (EFFEXOR-XR) 24 hr capsule 225 mg (225 mg Oral Given 06/25/23 0857)  torsemide (DEMADEX) tablet 120 mg (120 mg Oral Given 06/25/23 0857)   stroke: early stages of recovery book (has no administration in time range)  acetaminophen (TYLENOL) tablet 650 mg (has no administration in time range)    Or  acetaminophen (TYLENOL) 160 MG/5ML solution 650 mg (has no administration in time range)    Or  acetaminophen (TYLENOL) suppository 650 mg (has no administration in time range)  metoprolol tartrate (LOPRESSOR) injection 5 mg (has no administration in time range)  insulin aspart (novoLOG) injection 0-15 Units ( Subcutaneous Not Given 06/25/23 1231)  insulin aspart (novoLOG) injection 0-5 Units (has no administration in time range)  iohexol (OMNIPAQUE) 350 MG/ML injection 75 mL (75 mLs Intravenous Contrast Given 06/24/23 2119)  LORazepam (ATIVAN) injection 0.5 mg (0.5 mg Intravenous Given 06/24/23 2344)  ketorolac (TORADOL) 30 MG/ML injection 30 mg (30 mg Intravenous  Given 06/25/23 0407)    Mobility walks with person assist     Focused Assessments Neuro Assessment Handoff:  Swallow screen pass? Yes  Cardiac Rhythm: Normal sinus rhythm NIH Stroke Scale  Dizziness Present: No Headache Present: No Interval: Shift assessment Level  of Consciousness (1a.)   : Alert, keenly responsive LOC Questions (1b. )   : Answers both questions correctly LOC Commands (1c. )   : Performs both tasks correctly Best Gaze (2. )  : Normal Visual (3. )  : No visual loss Facial Palsy (4. )    : Normal symmetrical movements Motor Arm, Left (5a. )   : Drift Motor Arm, Right (5b. ) : No drift Motor Leg, Left (6a. )  : Some effort against gravity Motor Leg, Right (6b. ) : No drift Limb Ataxia (7. ): Absent Sensory (8. )  : Mild-to-moderate sensory loss, patient feels pinprick is less sharp or is dull on the affected side, or there is a loss of superficial pain with pinprick, but patient is aware of being touched Best Language (9. )  : No aphasia Dysarthria (10. ): Mild-to-moderate dysarthria, patient slurs at least some words and, at worst, can be understood with some difficulty Extinction/Inattention (11.)   : No Abnormality Complete NIHSS TOTAL: 5 Last date known well: 06/24/23 Last time known well: 1730 Neuro Assessment: Exceptions to [facility] Neuro Checks:   Initial (06/24/23 2122)  Has TPA been given? No If patient is a Neuro Trauma and patient is going to OR before floor call report to 4N Charge nurse: [phone] or [phone]   R Recommendations: See Admitting Provider Note  Report given to:   Additional Notes:

## 2023-06-26 ENCOUNTER — Inpatient Hospital Stay (HOSPITAL_COMMUNITY): Payer: Non-veteran care

## 2023-06-26 DIAGNOSIS — I6389 Other cerebral infarction: Secondary | ICD-10-CM | POA: Diagnosis not present

## 2023-06-26 DIAGNOSIS — I639 Cerebral infarction, unspecified: Secondary | ICD-10-CM | POA: Diagnosis not present

## 2023-06-26 LAB — GLUCOSE, CAPILLARY
Glucose-Capillary: 81 mg/dL (ref 70–99)
Glucose-Capillary: 124 mg/dL — ABNORMAL HIGH (ref 70–99)
Glucose-Capillary: 108 mg/dL — ABNORMAL HIGH (ref 70–99)
Glucose-Capillary: 130 mg/dL — ABNORMAL HIGH (ref 70–99)
Glucose-Capillary: 104 mg/dL — ABNORMAL HIGH (ref 70–99)

## 2023-06-26 LAB — ECHOCARDIOGRAM COMPLETE BUBBLE STUDY
AR max vel: 1.01 cm2
AV Area VTI: 1.11 cm2
AV Area mean vel: 1.09 cm2
AV Mean grad: 15.7 mm[Hg]
AV Peak grad: 29.4 mm[Hg]
Ao pk vel: 2.71 m/s
Area-P 1/2: 4.39 cm2
S' Lateral: 4.6 cm

## 2023-06-26 LAB — PHOSPHORUS: Phosphorus: 3.7 mg/dL (ref 2.5–4.6)

## 2023-06-26 LAB — CBC WITH DIFFERENTIAL/PLATELET
Abs Immature Granulocytes: 0.05 10*3/uL (ref 0.00–0.07)
Basophils Absolute: 0 10*3/uL (ref 0.0–0.1)
Basophils Relative: 0 %
Eosinophils Absolute: 0.4 10*3/uL (ref 0.0–0.5)
Eosinophils Relative: 5 %
HCT: 40.7 % (ref 39.0–52.0)
Hemoglobin: 13.1 g/dL (ref 13.0–17.0)
Immature Granulocytes: 1 %
Lymphocytes Relative: 17 %
Lymphs Abs: 1.6 10*3/uL (ref 0.7–4.0)
MCH: 29.2 pg (ref 26.0–34.0)
MCHC: 32.2 g/dL (ref 30.0–36.0)
MCV: 90.6 fL (ref 80.0–100.0)
Monocytes Absolute: 0.7 10*3/uL (ref 0.1–1.0)
Monocytes Relative: 8 %
Neutro Abs: 6.4 10*3/uL (ref 1.7–7.7)
Neutrophils Relative %: 69 %
Platelets: 277 10*3/uL (ref 150–400)
RBC: 4.49 MIL/uL (ref 4.22–5.81)
RDW: 17.2 % — ABNORMAL HIGH (ref 11.5–15.5)
WBC: 9.3 10*3/uL (ref 4.0–10.5)
nRBC: 0 % (ref 0.0–0.2)

## 2023-06-26 LAB — COMPREHENSIVE METABOLIC PANEL
ALT: 17 U/L (ref 0–44)
AST: 13 U/L — ABNORMAL LOW (ref 15–41)
Albumin: 3.3 g/dL — ABNORMAL LOW (ref 3.5–5.0)
Alkaline Phosphatase: 41 U/L (ref 38–126)
Anion gap: 9 (ref 5–15)
BUN: 27 mg/dL — ABNORMAL HIGH (ref 8–23)
CO2: 29 mmol/L (ref 22–32)
Calcium: 8.4 mg/dL — ABNORMAL LOW (ref 8.9–10.3)
Chloride: 101 mmol/L (ref 98–111)
Creatinine, Ser: 1.29 mg/dL — ABNORMAL HIGH (ref 0.61–1.24)
GFR, Estimated: >60 mL/min (ref >60)
Glucose, Bld: 101 mg/dL — ABNORMAL HIGH (ref 70–99)
Potassium: 3.2 mmol/L — ABNORMAL LOW (ref 3.5–5.1)
Sodium: 139 mmol/L (ref 135–145)
Total Bilirubin: 0.4 mg/dL (ref <1.2)
Total Protein: 6.4 g/dL — ABNORMAL LOW (ref 6.5–8.1)

## 2023-06-26 LAB — HEMOGLOBIN A1C
Hgb A1c MFr Bld: 6.5 % — ABNORMAL HIGH (ref 4.8–5.6)
Mean Plasma Glucose: 139.85 mg/dL

## 2023-06-26 LAB — MAGNESIUM: Magnesium: 2.3 mg/dL (ref 1.7–2.4)

## 2023-06-26 MED ORDER — PANTOPRAZOLE SODIUM 40 MG PO TBEC
40.0000 mg | DELAYED_RELEASE_TABLET | Freq: Every day | ORAL | Status: DC
  Administered 2023-06-26 – 2023-06-28 (×3): 40 mg via ORAL
  Filled 2023-06-26 (×3): qty 1

## 2023-06-26 MED ORDER — POTASSIUM CHLORIDE CRYS ER 20 MEQ PO TBCR
20.0000 meq | EXTENDED_RELEASE_TABLET | Freq: Once | ORAL | Status: AC
  Administered 2023-06-26: 20 meq via ORAL
  Filled 2023-06-26: qty 1

## 2023-06-26 MED ORDER — VITAMIN D 25 MCG (1000 UNIT) PO TABS
2000.0000 [IU] | ORAL_TABLET | Freq: Every day | ORAL | Status: DC
  Administered 2023-06-26 – 2023-06-28 (×3): 2000 [IU] via ORAL
  Filled 2023-06-26 (×3): qty 2

## 2023-06-26 MED ORDER — METHOCARBAMOL 500 MG PO TABS
500.0000 mg | ORAL_TABLET | Freq: Three times a day (TID) | ORAL | Status: DC | PRN
  Administered 2023-06-26 – 2023-06-28 (×5): 500 mg via ORAL
  Filled 2023-06-26 (×5): qty 1

## 2023-06-26 MED ORDER — POTASSIUM CHLORIDE 10 MEQ/100ML IV SOLN
10.0000 meq | INTRAVENOUS | Status: AC
  Administered 2023-06-26 (×2): 10 meq via INTRAVENOUS
  Filled 2023-06-26 (×2): qty 100

## 2023-06-26 MED ORDER — FERROUS SULFATE 325 (65 FE) MG PO TABS
325.0000 mg | ORAL_TABLET | Freq: Every day | ORAL | Status: DC
  Administered 2023-06-26 – 2023-06-28 (×3): 325 mg via ORAL
  Filled 2023-06-26 (×3): qty 1

## 2023-06-26 MED ORDER — VITAMIN B-12 1000 MCG PO TABS
1000.0000 ug | ORAL_TABLET | Freq: Every day | ORAL | Status: DC
  Administered 2023-06-26 – 2023-06-28 (×3): 1000 ug via ORAL
  Filled 2023-06-26 (×3): qty 1

## 2023-06-26 MED ORDER — COLCHICINE 0.3 MG HALF TABLET
0.3000 mg | ORAL_TABLET | Freq: Two times a day (BID) | ORAL | Status: DC
  Administered 2023-06-26 – 2023-06-28 (×5): 0.3 mg via ORAL
  Filled 2023-06-26 (×6): qty 1

## 2023-06-26 MED ORDER — SPIRONOLACTONE 25 MG PO TABS
25.0000 mg | ORAL_TABLET | Freq: Every day | ORAL | Status: DC
  Administered 2023-06-26 – 2023-06-28 (×3): 25 mg via ORAL
  Filled 2023-06-26 (×3): qty 1

## 2023-06-26 MED ORDER — ROSUVASTATIN CALCIUM 20 MG PO TABS
20.0000 mg | ORAL_TABLET | Freq: Every day | ORAL | Status: DC
  Administered 2023-06-26 – 2023-06-28 (×3): 20 mg via ORAL
  Filled 2023-06-26 (×3): qty 1

## 2023-06-26 MED ORDER — MAGNESIUM SULFATE 2 GM/50ML IV SOLN
2.0000 g | Freq: Once | INTRAVENOUS | Status: AC
  Administered 2023-06-26: 2 g via INTRAVENOUS
  Filled 2023-06-26: qty 50

## 2023-06-26 MED ORDER — METOPROLOL TARTRATE 25 MG PO TABS: 25.0000 mg | ORAL_TABLET | Freq: Two times a day (BID) | ORAL | Status: DC

## 2023-06-26 MED ORDER — POTASSIUM CHLORIDE CRYS ER 20 MEQ PO TBCR
40.0000 meq | EXTENDED_RELEASE_TABLET | Freq: Once | ORAL | Status: AC
  Administered 2023-06-26: 40 meq via ORAL
  Filled 2023-06-26: qty 2

## 2023-06-26 MED ORDER — METOPROLOL TARTRATE 12.5 MG HALF TABLET
12.5000 mg | ORAL_TABLET | Freq: Two times a day (BID) | ORAL | Status: DC
  Administered 2023-06-26 – 2023-06-28 (×5): 12.5 mg via ORAL
  Filled 2023-06-26 (×5): qty 1

## 2023-06-26 NOTE — Progress Notes (Signed)
OT Cancellation Note  Patient Details Name: Lawrence [name] MRN: [id number] DOB: [birth year]   Cancelled Treatment:    Reason Eval/Treat Not Completed: Patient at procedure or test/ unavailable (in Echo. Will return later time.)  [facility] 06/26/2023, 10:34 AM Luisa Dago, OT/L   Acute OT Clinical Specialist Acute Rehabilitation Services Pager [phone] Office [phone]

## 2023-06-26 NOTE — Consult Note (Addendum)
Cardiology Consultation   Patient ID: [name] MRN: [id number]; DOB: 1956/02/23  Admit date: 06/24/2023 Date of Consult: 06/26/2023  PCP:  [name], PA-C   [facility] Providers Cardiologist:  Armanda Magic, MD  Electrophysiologist:  Regan Lemming, MD  Advanced Heart Failure:  Marca Ancona, MD  {  Patient Profile:   Lawrence [name] is a 67 y.o. male with a hx of paroxysmal atrial fibrillation, aortic stenosis status post TAVR, chronic HFpEF, PVCs, seizures, Hypertension, hyperlipidemia, type 2 diabetes, OSA uses BiPAP, who is being seen 06/26/2023 for the evaluation of wide-complex tachycardia at the request of Dr. Thedore Mins.  History of Present Illness:   Lawrence [name] is followed by advanced heart failure and last seen 06/09/2023.  This year in June he was admitted for CHF exacerbation and severe aortic stenosis.  She had severe AS with a mean gradient of 40 and a VTI of 1.03.  Preoperative heart catheterization showed mild to moderate diffuse nonobstructive CAD with normal cardiac output with slightly elevated filling pressures.  During that admission he had significant diuresis over 30 L.  Eventually underwent TAVR 34 mm Medtronic Evolut FX THV via the TF approach on 02/07/23.  Postoperatively he had done well with no complications and had post mean gradient of 17.  He was discharged with a heart monitor given new left bundle branch block and preoperative right bundle branch block.  He started being worked up for amyloidosis.  Multiple myeloma panel did not show any M spike protein but did show elevated kappa free light chain at 23 however normal kappa lambda ratio.  PYP scan was ordered and was equivocal with no further recommendations to continue workup besides follow-up PYP scan in 1 to 2 years.  Additionally, he has been seen on various occasions throughout this year for heart failure exacerbation and requires high-dose torsemide 120 mg twice daily along with  [place] and spironolactone.  Also has had a heart monitor that showed frequent PVCs approximately 9% burden.  He does not ambulate very well given his large size and BMI of 48.  He states that he moves around with a rollator, and ambulates short distances.  Currently patient is being evaluated for acute ischemic, nonhemorrhagic left cerebral infarct.  Of note patient was last seen in September of this year and was noted to have new acute stroke vs recrudescence of prior stroke symptoms.  Patient does not have great memory of the events but had been reported to have had decreased responsiveness and was unarousable.  Wife had reported to have been trying to wake him up for approximately 10 minutes.  Also had some left-sided weakness.  Neurology saw him yesterday feels related to chronic small vessel disease.  Patient reports being compliant with his Eliquis.  Cardiology has been asked to see due to wide-complex tachycardia that was noted on his telemetry at approximately 11:12-11:14am.  There was concern for torsades so he has been given magnesium infusion and started on potassium as well.  Potassium was 3.2 today.  Magnesium 2.3  Overall, patient asymptomatic from this event, however his memory is poor and he does not recall details of this timeline.  Nurse had reported that he has felt a little bit dizzy today.  Regardless, patient has denied any associated chest pain, shortness of breath, palpitations, syncope with this event.  It is unclear of exactly what he was doing during this event.  PTA he did note some atypical chest pain that he stated  that would happen after ambulating and while sitting that he described as a dullness but denied any associated symptoms.  This was also documented in his previous encounter earlier this month and felt to be atypical.  Otherwise he reports compliancy with all of his medications and feels around baseline as far as a volume status.  No significant shortness of breath or  orthopnea.  Potassium 3.2, creatinine 1.29, BUN 27.  Magnesium 2.3.  AST 13.  Normal GFR.  A1c 6.5%.   Past Medical History:  Diagnosis Date   Acquired dilation of ascending aorta and aortic root (HCC)    40mm by echo 01/2021   Adenomatous colon polyp 2007   Anemia    Anxiety    Asthma    BPH without obstruction/lower urinary tract symptoms 02/22/2017   Chronic diastolic (congestive) heart failure (HCC)    Chronic venous stasis 03/07/2019   COPD (chronic obstructive pulmonary disease) (HCC)    Coronary artery calcification seen on CAT scan    Depression    Diabetic neuropathy (HCC) 09/11/2019   History of colon polyps 08/24/2018   Hypertension    Morbid obesity (HCC)    OSA (obstructive sleep apnea)    Pain due to onychomycosis of toenails of both feet 09/11/2019   Peripheral neuropathy 02/22/2017   Primary osteoarthritis, left shoulder 03/05/2017   PTSD (post-traumatic stress disorder)    Pure hypercholesterolemia    QT prolongation 03/07/2019   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 Lynnette Caffey and Dr. Laneta Simmers   Seizures [facility])    Severe aortic stenosis    Sinus tachycardia 03/07/2019   Sleep apnea    CPAP   Type 2 diabetes mellitus with vascular disease (HCC) 09/11/2019    Past Surgical History:  Procedure Laterality Date   ENDOVENOUS ABLATION SAPHENOUS VEIN W/ LASER Right 08/20/2020   endovenous laser ablation right greater saphenous vein by Cari Caraway MD    ENDOVENOUS ABLATION SAPHENOUS VEIN W/ LASER Left 11/16/2022   endovenous laser ablation left greater saphenous vein by Cari Caraway MD   INTRAOPERATIVE TRANSTHORACIC ECHOCARDIOGRAM N/A 02/07/2023   Procedure: INTRAOPERATIVE TRANSTHORACIC ECHOCARDIOGRAM;  Surgeon: Orbie Pyo, MD;  Location: [facility] INVASIVE CV LAB;  Service: Open Heart Surgery;  Laterality: N/A;   JOINT REPLACEMENT     left knee replacement x 2   KNEE ARTHROSCOPY Bilateral    LEFT  HEART CATH AND CORONARY ANGIOGRAPHY N/A 01/17/2018   Procedure: LEFT HEART CATH AND CORONARY ANGIOGRAPHY;  Surgeon: Marykay Lex, MD;  Location: [facility] INVASIVE CV LAB;  Service: Cardiovascular;  Laterality: N/A;   PRESSURE SENSOR/CARDIOMEMS N/A 08/26/2021   Procedure: PRESSURE SENSOR/CARDIOMEMS;  Surgeon: Laurey Morale, MD;  Location: [facility] INVASIVE CV LAB;  Service: Cardiovascular;  Laterality: N/A;   RIGHT HEART CATH N/A 08/26/2021   Procedure: RIGHT HEART CATH;  Surgeon: Laurey Morale, MD;  Location: [facility] INVASIVE CV LAB;  Service: Cardiovascular;  Laterality: N/A;   RIGHT HEART CATH AND CORONARY ANGIOGRAPHY N/A 01/26/2023   Procedure: RIGHT HEART CATH AND CORONARY ANGIOGRAPHY;  Surgeon: Orbie Pyo, MD;  Location: [facility] INVASIVE CV LAB;  Service: Cardiovascular;  Laterality: N/A;   TEE WITHOUT CARDIOVERSION N/A 08/26/2021   Procedure: TRANSESOPHAGEAL ECHOCARDIOGRAM (TEE);  Surgeon: Laurey Morale, MD;  Location: [facility] ENDOSCOPY;  Service: Cardiovascular;  Laterality: N/A;   TOOTH EXTRACTION N/A 02/03/2023   Procedure: DENTAL RESTORATION/EXTRACTIONS;  Surgeon: [name], DMD;  Location: [facility] OR;  Service: Oral  Surgery;  Laterality: N/A;   TRANSCATHETER AORTIC VALVE REPLACEMENT, TRANSFEMORAL Right 02/07/2023   Procedure: Transcatheter Aortic Valve Replacement, Transfemoral;  Surgeon: Orbie Pyo, MD;  Location: [facility] INVASIVE CV LAB;  Service: Open Heart Surgery;  Laterality: Right;   UMBILICAL HERNIA REPAIR       Inpatient Medications: Scheduled Meds:  apixaban  5 mg Oral BID   ARIPiprazole  5 mg Oral Daily   busPIRone  10 mg Oral TID   cholecalciferol  2,000 Units Oral Daily   colchicine  0.3 mg Oral BID   cyanocobalamin  1,000 mcg Oral Daily   fenofibrate  160 mg Oral Daily   ferrous sulfate  325 mg Oral Q breakfast   fluticasone furoate-vilanterol  1 puff Inhalation Daily   And   umeclidinium bromide  1 puff Inhalation Daily   icosapent Ethyl  2 g Oral BID   insulin aspart   0-15 Units Subcutaneous TID WC   insulin aspart  0-5 Units Subcutaneous QHS   insulin aspart  12 Units Subcutaneous TID WC   insulin glargine-yfgn  40 Units Subcutaneous Q24H   lacosamide  150 mg Oral BID   levETIRAcetam  500 mg Oral BID   metoprolol tartrate  12.5 mg Oral BID   pantoprazole  40 mg Oral Daily   pregabalin  150 mg Oral TID   rosuvastatin  20 mg Oral Daily   spironolactone  25 mg Oral Daily   torsemide  120 mg Oral BID   venlafaxine XR  225 mg Oral Q breakfast   Continuous Infusions:  potassium chloride 10 mEq (06/26/23 1325)   PRN Meds: acetaminophen **OR** acetaminophen (TYLENOL) oral liquid 160 mg/5 mL **OR** acetaminophen, HYDROcodone-acetaminophen, metoprolol tartrate  Allergies:    Allergies  Allergen Reactions   Vancomycin Other (See Comments)    "Red Man Syndrome" 02/02/17: possible cause for rash under both arms   Niacin And Related Other (See Comments)    Red man syndrome   Tubersol [Tuberculin, Ppd] Other (See Comments)    Reaction unknown   Doxycycline Rash and Other (See Comments)    Social History:   Social History   Socioeconomic History   Marital status: Significant Other    Spouse name: [name]   Number of children: 1   Years of education: Not on file   Highest education level: Associate degree: academic program  Occupational History   Occupation: retired    Comment: Building control surveyor  Tobacco Use   Smoking status: Former    Current packs/day: 0.00    Average packs/day: 1 pack/day for 44.0 years (44.0 ttl pk-yrs)    Types: Cigarettes    Start date: 03/07/1972    Quit date: 03/07/2016    Years since quitting: 7.3    Passive exposure: Past   Smokeless tobacco: Never  Vaping Use   Vaping status: Never Used  Substance and Sexual Activity   Alcohol use: Yes    Comment: rare   Drug use: No   Sexual activity: Not on file  Other Topics Concern   Not on file  Social History Narrative   Lives with wife [name]   Right Handed    Drinks 1-2 cups caffeine   Social Determinants of Health   Financial Resource Strain: Medium Risk (12/06/2021)   Received from [facility], [facility]   Overall Financial Resource Strain (CARDIA)    Difficulty of Paying Living Expenses: Somewhat hard  Food Insecurity: Food Insecurity Present (06/25/2023)   Hunger Vital Sign  Worried About Programme researcher, broadcasting/film/video in the Last Year: Sometimes true    [employer] of Food in the Last Year: Sometimes true  Transportation Needs: No Transportation Needs (06/25/2023)   PRAPARE - Administrator, Civil Service (Medical): No    Lack of Transportation (Non-Medical): No  Physical Activity: Unknown (12/06/2021)   Received from [facility], [facility]   Exercise Vital Sign    Days of Exercise per Week: Patient declined    Minutes of Exercise per Session: 10 min  Stress: No Stress Concern Present (01/21/2022)   Received from [facility] of Occupational Health - Occupational Stress Questionnaire    Feeling of Stress : Only a little  Recent Concern: Stress - Stress Concern Present (11/19/2021)   Received from [facility] of Occupational Health - Occupational Stress Questionnaire    Feeling of Stress : To some extent  Social Connections: Unknown (12/18/2022)   Received from [facility], [facility]   Social Network    Social Network: Not on file  Intimate Partner Violence: Not At Risk (06/25/2023)   Humiliation, Afraid, Rape, and Kick questionnaire    Fear of Current or Ex-Partner: No    Emotionally Abused: No    Physically Abused: No    Sexually Abused: No    Family History:   Family History  Problem Relation Age of Onset   CAD Maternal Grandfather    Diabetes Other    Diabetes Mellitus II Neg Hx    Colon cancer Neg Hx    Esophageal cancer Neg Hx    Inflammatory bowel disease Neg Hx    Liver disease Neg Hx    Pancreatic cancer Neg Hx    Rectal cancer Neg Hx     Stomach cancer Neg Hx    Sleep apnea Neg Hx      ROS:  Please see the history of present illness.  All other ROS reviewed and negative.     Physical Exam/Data:   Vitals:   06/26/23 0400 06/26/23 0742 06/26/23 0813 06/26/23 1200  BP: 130/83 112/74  (!) 141/89  Pulse: 82 79  80  Resp: 20 16  18   Temp: 98.3 F (36.8 C) 98.5 F (36.9 C)  98.9 F (37.2 C)  TempSrc: Axillary Oral    SpO2: 92% 92% 93% 92%  Weight:      Height:        Intake/Output Summary (Last 24 hours) at 06/26/2023 1358 Last data filed at 06/26/2023 1225 Gross per 24 hour  Intake --  Output 4330 ml  Net -4330 ml      06/24/2023    9:11 PM 06/24/2023    9:00 PM 06/09/2023    1:26 PM  Last 3 Weights  Weight (lbs) 373 lb 0.3 oz 373 lb 0.3 oz 346 lb  Weight (kg) 169.2 kg 169.2 kg 156.945 kg     Body mass index is 47.89 kg/m.  General: Morbidly obese on 3 L nasal cannula HEENT: normal Neck: Difficult to assess JVD Vascular: No carotid bruits; Distal pulses 2+ bilaterally Cardiac: Irregular, PVCs Lungs:  clear to auscultation bilaterally, no wheezing, rhonchi or rales  Abd: soft, nontender, no hepatomegaly  Ext: Mild edema, tortuous veins, compression wraps  Musculoskeletal:  No deformities, BUE and BLE strength normal and equal Skin: warm and dry  Neuro:  CNs 2-12 intact, no focal abnormalities noted Psych:  Normal affect   EKG:  The EKG was  personally reviewed and demonstrates: Normal sinus rhythm, first-degree AV block, PR 278.  QTc 481.  Frequent PVCs.  Low voltage.  This was a repeat, previous EKG with QTc that slightly less at 465. Telemetry:  Telemetry was personally reviewed and demonstrates: Normal sinus rhythm, frequent PVCs.  Heart rates in the 70s.  Wide-complex tachycardia that appear related to artifact.  See below for more detail.  Did have some pauses around 3 to 5 seconds associated with frequent PVCs  Relevant CV Studies: Echocardiogram with bubble study 06/26/2023  1. Technically  difficult study.   2. Left ventricular ejection fraction, by estimation, is 50 to 55%. The  left ventricle has low normal function. Left ventricular endocardial  border not optimally defined to evaluate regional wall motion. The left  ventricular internal cavity size was  dilated. Left ventricular diastolic function could not be evaluated.   3. Right ventricular systolic function is normal. The right ventricular  size is normal. Mildly increased right ventricular wall thickness.   4. There is no evidence of cardiac tamponade.   5. The mitral valve is grossly normal. No evidence of mitral valve  regurgitation. No evidence of mitral stenosis.   6. The aortic valve was not well visualized. Aortic valve regurgitation  is not visualized. But based on doppler parameter cannot rule out mild  (per max velocity and MG) to moderate aortic stenosis (dimensional index).   7. Aortic ascending aorta is structurally normal, with no evidence of  dilitation.   8. Agitated saline contrast bubble study was inconclusive.   Conclusion(s)/Recommendation(s): No intracardiac source of embolism  detected on this transthoracic study. Consider a transesophageal  echocardiogram to exclude cardiac source of embolism if clinically  indicated.   PYP scan 03/10/2023 Myocardial uptake was positive for radiotracer uptake. The visual grade of myocardial uptake relative to the ribs was Grade 1 (Myocardial uptake less than rib uptake).   Findings are equivocal (Grade 1) of cardiac ATTR amyloidosis.  Consider cardiac MRI for further evaluation  2-week cardiac monitor 03/08/2023 Patch Wear Time:  13 days and 1 hours (2024-07-03T14:34:31-0400 to 2024-07-20T18:35:49-0400)   Monitor 1 Patient had a min HR of 72 bpm, max HR of 97 bpm, and avg HR of 86 bpm. Predominant underlying rhythm was Sinus Rhythm First Degree AV Block was present. Bundle Branch Block/IVCD was present.. Isolated SVEs were rare (<1.0%), and no SVE Couplets or  SVE  Triplets were present. Isolated VEs were rare (<1.0%), and no VE Couplets or VE Triplets were present. Difficulty discerning atrial activity making definitive diagnosis difficult to ascertain.   Monitor 2 Patient had a min HR of 68 bpm, max HR of 188 bpm, and avg HR of 89 bpm. Predominant underlying rhythm was Sinus Rhythm. First Degree AV Block was present. QRS morphology changes were present due to Intermittent Bundle Branch Block. 1 run of Ventricular  Tachycardia occurred lasting 4 beats with a max rate of 162 bpm (avg 111 bpm). 598 Supraventricular Tachycardia runs occurred, the run with the fastest interval lasting 4 beats with a max rate of 188 bpm, the longest lasting 1 min 29 secs with an avg  rate of 103 bpm. Isolated SVEs were rare (<1.0%), SVE Couplets were rare (<1.0%), and SVE Triplets were rare (<1.0%). Isolated VEs were frequent (9.0%, [id number]), VE Couplets were occasional (1.4%, 9143), and VE Triplets were rare (<1.0%, 4119).  Ventricular Bigeminy and Trigeminy were present.   Conclusion:  1. Predominantly NSR 2. 10.4% ventricular beats 3. Frequent short SVT runs, ?  atrial tachycardia   Laboratory Data:  High Sensitivity Troponin:  No results for input(s): "TROPONINIHS" in the last 720 hours.   Chemistry Recent Labs  Lab 06/24/23 2109 06/24/23 2155 06/25/23 0426 06/26/23 0405  NA 139 140 140 139  K 4.3 3.1* 3.2* 3.2*  CL 102  --  106 101  CO2 25  --  26 29  GLUCOSE 82  --  109* 101*  BUN 29*  --  30* 27*  CREATININE 1.18  --  1.14 1.29*  CALCIUM 9.6  --  8.9 8.4*  MG  --   --   --  2.3  GFRNONAA >60  --  >60 >60  ANIONGAP 12  --  8 9    Recent Labs  Lab 06/24/23 2109 06/25/23 0426 06/26/23 0405  PROT 6.9 6.4* 6.4*  ALBUMIN 3.7 3.3* 3.3*  AST 30 15 13*  ALT 23 18 17   ALKPHOS 56 45 41  BILITOT 1.0 0.4 0.4   Lipids  Recent Labs  Lab 06/25/23 0426  CHOL 151  TRIG 209*  HDL 30*  LDLCALC 79  CHOLHDL 5.0    Hematology Recent Labs  Lab  06/24/23 2109 06/24/23 2155 06/25/23 0426 06/26/23 0405  WBC 9.0  --  9.9 9.3  RBC 4.84  --  4.44 4.49  HGB 14.0 13.3 12.9* 13.1  HCT 43.8 39.0 40.6 40.7  MCV 90.5  --  91.4 90.6  MCH 28.9  --  29.1 29.2  MCHC 32.0  --  31.8 32.2  RDW 17.3*  --  17.2* 17.2*  PLT 265  --  263 277   Thyroid  Recent Labs  Lab 06/24/23 2328  TSH 1.561    BNPNo results for input(s): "BNP", "PROBNP" in the last 168 hours.  DDimer No results for input(s): "DDIMER" in the last 168 hours.   Radiology/Studies:  ECHOCARDIOGRAM COMPLETE BUBBLE STUDY  Result Date: 06/26/2023    ECHOCARDIOGRAM REPORT   Patient Name:   BENEDICTO NAUMANN Date of Exam: 06/26/2023 Medical Rec #:  [id number]        Height:       74.0 in Accession #:    [id number]       Weight:       373.0 lb Date of Birth:  Jun 11, 1956        BSA:          2.833 m Patient Age:    67 years         BP:           112/74 mmHg Patient Gender: M                HR:           79 bpm. Exam Location:  Inpatient Procedure: 2D Echo, Cardiac Doppler, Color Doppler and Saline Contrast Bubble            Study Indications:    Stroke I63.9  History:        Patient has prior history of Echocardiogram examinations, most                 recent 03/10/2023. CHF, CAD, Stroke and COPD, Arrythmias:Atrial                 Fibrillation; Risk Factors:Hypertension, Dyslipidemia and Former                 Smoker.  Sonographer:    Dondra Prader RVT RCS Referring Phys: [phone] DEBBY CROSLEY  Sonographer  Comments: Technically difficult study due to poor echo windows, suboptimal apical window and suboptimal subcostal window. Image acquisition challenging due to COPD and Image acquisition challenging due to patient body habitus. IMPRESSIONS  1. Technically difficult study.  2. Left ventricular ejection fraction, by estimation, is 50 to 55%. The left ventricle has low normal function. Left ventricular endocardial border not optimally defined to evaluate regional wall motion. The left ventricular  internal cavity size was dilated. Left ventricular diastolic function could not be evaluated.  3. Right ventricular systolic function is normal. The right ventricular size is normal. Mildly increased right ventricular wall thickness.  4. There is no evidence of cardiac tamponade.  5. The mitral valve is grossly normal. No evidence of mitral valve regurgitation. No evidence of mitral stenosis.  6. The aortic valve was not well visualized. Aortic valve regurgitation is not visualized. But based on doppler parameter cannot rule out mild (per max velocity and MG) to moderate aortic stenosis (dimensional index).  7. Aortic ascending aorta is structurally normal, with no evidence of dilitation.  8. Agitated saline contrast bubble study was inconclusive. Conclusion(s)/Recommendation(s): No intracardiac source of embolism detected on this transthoracic study. Consider a transesophageal echocardiogram to exclude cardiac source of embolism if clinically indicated. FINDINGS  Left Ventricle: Left ventricular ejection fraction, by estimation, is 50 to 55%. The left ventricle has low normal function. Left ventricular endocardial border not optimally defined to evaluate regional wall motion. The left ventricular internal cavity  size was dilated. There is borderline left ventricular hypertrophy. Left ventricular diastolic function could not be evaluated. Right Ventricle: The right ventricular size is normal. Mildly increased right ventricular wall thickness. Right ventricular systolic function is normal. Left Atrium: Left atrial size was normal in size. Right Atrium: Right atrial size was normal in size. Pericardium: Trivial pericardial effusion is present. There is no evidence of cardiac tamponade. Mitral Valve: The mitral valve is grossly normal. No evidence of mitral valve regurgitation. No evidence of mitral valve stenosis. Tricuspid Valve: The tricuspid valve is grossly normal. Tricuspid valve regurgitation is not  demonstrated. No evidence of tricuspid stenosis. Aortic Valve: The aortic valve was not well visualized. Aortic valve regurgitation is not visualized. But based on doppler parameter cannot rule out mild (per max velocity and MG) to moderate aortic stenosis (dimensional index). Aortic valve mean gradient measures 15.7 mmHg. Aortic valve peak gradient measures 29.4 mmHg. Aortic valve area, by VTI measures 1.11 cm. Pulmonic Valve: The pulmonic valve was not well visualized. Pulmonic valve regurgitation is not visualized. No evidence of pulmonic stenosis. Aorta: Aortic root could not be assessed and ascending aorta is structurally normal, with no evidence of dilitation. IAS/Shunts: The interatrial septum was not assessed. Agitated saline contrast was given intravenously to evaluate for intracardiac shunting. Agitated saline contrast bubble study was inconclusive.  LEFT VENTRICLE PLAX 2D LVIDd:         6.30 cm LVIDs:         4.60 cm LV PW:         1.30 cm LV IVS:        1.10 cm LVOT diam:     1.80 cm LV SV:         58 LV SV Index:   21 LVOT Area:     2.54 cm  RIGHT VENTRICLE             IVC RV S prime:     16.20 cm/s  IVC diam: 3.30 cm LEFT ATRIUM  Index        RIGHT ATRIUM           Index LA diam:        4.80 cm 1.69 cm/m   RA Area:     17.10 cm LA Vol (A2C):   62.1 ml 21.92 ml/m  RA Volume:   43.60 ml  15.39 ml/m LA Vol (A4C):   77.2 ml 27.25 ml/m LA Biplane Vol: 70.8 ml 24.99 ml/m  AORTIC VALVE                     PULMONIC VALVE AV Area (Vmax):    1.01 cm      PV Vmax:       1.23 m/s AV Area (Vmean):   1.09 cm      PV Peak grad:  6.1 mmHg AV Area (VTI):     1.11 cm AV Vmax:           271.33 cm/s AV Vmean:          181.000 cm/s AV VTI:            0.524 m AV Peak Grad:      29.4 mmHg AV Mean Grad:      15.7 mmHg LVOT Vmax:         107.42 cm/s LVOT Vmean:        77.260 cm/s LVOT VTI:          0.229 m LVOT/AV VTI ratio: 0.44  AORTA Ao Asc diam: 3.80 cm MITRAL VALVE MV Area (PHT): 4.39 cm    SHUNTS  MV Decel Time: 173 msec    Systemic VTI:  0.23 m MV E velocity: 85.60 cm/s  Systemic Diam: 1.80 cm MV A velocity: 78.00 cm/s MV E/A ratio:  1.10 Sunit Tolia Electronically signed by Tessa Lerner Signature Date/Time: 06/26/2023/11:37:34 AM    Final    MR BRAIN WO CONTRAST  Result Date: 06/25/2023 CLINICAL DATA:  Initial evaluation for neuro deficit, stroke suspected. EXAM: MRI HEAD WITHOUT CONTRAST TECHNIQUE: Multiplanar, multiecho pulse sequences of the brain and surrounding structures were obtained without intravenous contrast. COMPARISON:  Prior CTs from 06/24/2023. FINDINGS: Brain: Mild age-related cerebral volume loss. Few scattered subcentimeter foci of T2/FLAIR signal intensity seen involving the supratentorial cerebral white matter, most like related chronic microvascular ischemic disease, less than is typically seen for patient age. 5 mm focus of restricted diffusion seen involving the left cerebellum (series 5, image 57), consistent with a small acute ischemic nonhemorrhagic infarct. No other evidence for acute or subacute ischemia elsewhere. No acute or chronic intracranial blood products. No mass lesion, midline shift or mass effect. No hydrocephalus or extra-axial fluid collection. Pituitary gland suprasellar region within normal limits. Vascular: Major intracranial vascular flow voids are maintained. Skull and upper cervical spine: Craniocervical junction within normal limits. Bone marrow signal intensity normal. No scalp soft tissue abnormality. Sinuses/Orbits: Globes and orbital soft tissues within normal limits. Mild mucosal thickening present about the sphenoid ethmoidal sinuses. No mastoid effusion. Other: None. IMPRESSION: 1. 5 mm acute ischemic nonhemorrhagic left cerebellar infarct. 2. Underlying mild age-related cerebral volume loss with mild chronic microvascular ischemic disease. Electronically Signed   By: Rise Mu M.D.   On: 06/25/2023 01:14   DG Chest Portable 1  View  Result Date: 06/24/2023 CLINICAL DATA:  Altered level of consciousness EXAM: PORTABLE CHEST 1 VIEW COMPARISON:  05/15/2023 FINDINGS: Single frontal view of the chest demonstrates a stable cardiac silhouette. Chronic scarring at the lung  bases. No acute airspace disease, effusion, or pneumothorax. No acute bony abnormalities. IMPRESSION: 1. Chronic bibasilar scarring.  No acute airspace disease. Electronically Signed   By: Sharlet Salina M.D.   On: 06/24/2023 22:52   CT ANGIO HEAD NECK W WO CM  Result Date: 06/24/2023 CLINICAL DATA:  Stroke/TIA. Determine embolic source. Left-sided facial droop. EXAM: CT ANGIOGRAPHY HEAD AND NECK WITH AND WITHOUT CONTRAST TECHNIQUE: Multidetector CT imaging of the head and neck was performed using the standard protocol during bolus administration of intravenous contrast. Multiplanar CT image reconstructions and MIPs were obtained to evaluate the vascular anatomy. Carotid stenosis measurements (when applicable) are obtained utilizing NASCET criteria, using the distal internal carotid diameter as the denominator. RADIATION DOSE REDUCTION: This exam was performed according to the departmental dose-optimization program which includes automated exposure control, adjustment of the mA and/or kV according to patient size and/or use of iterative reconstruction technique. CONTRAST:  75mL OMNIPAQUE IOHEXOL 350 MG/ML SOLN COMPARISON:  CT head without contrast 06/24/2023. CT angio head and neck 05/07/2023. FINDINGS: CTA NECK FINDINGS Aortic arch: Atherosclerotic calcifications are present at the aortic arch without significant stenosis relative to the distal vessel. A scratched at the left common carotid artery shares a common origin with the innominate artery. Right carotid system: The right common carotid artery is within normal limits. Atherosclerotic calcifications are present at the bifurcation without significant stenosis. Mild tortuosity is present in the cervical left ICA.  Left carotid system: The left common carotid artery is within normal limits. Atherosclerotic calcifications are present at the bifurcation. No significant stenosis is present. Mild tortuosity is present the cervical left ICA. Vertebral arteries: The left vertebral artery is the dominant vessel. Both vertebral arteries originate from the subclavian arteries without significant stenosis. Significant stenosis is present either vertebral artery in. Skeleton: Neck level degenerative changes are again noted in the cervical spine. No focal osseous lesions are present. Other neck: Soft tissues the neck are otherwise unremarkable. Salivary glands are within normal limits. Thyroid is normal. No significant adenopathy is present. No focal mucosal or submucosal lesions are present. Upper chest: Mild dependent atelectasis is present in the lungs bilaterally. Review of the MIP images confirms the above findings CTA HEAD FINDINGS Anterior circulation: The internal carotid arteries are within normal limits from the skull base to the ICA termini. The A1 and M1 segments are normal. The anterior communicating artery is patent. MCA bifurcations are normal. The ACA and MCA branch vessels are within normal limits. No aneurysm is present. Posterior circulation: Left vertebral artery is dominant vessel. The PICA origins are visualized and normal. The vertebrobasilar junction and basilar artery are normal. The superior cerebellar arteries are patent. Both posterior cerebral arteries originate from basilar tip. PCA branch vessels are normal bilaterally. Venous sinuses: The dural sinuses are patent. The straight sinus and deep cerebral veins are intact. Cortical veins are within normal limits. No significant vascular malformation is evident. Anatomic variants: None Review of the MIP images confirms the above findings IMPRESSION: 1. No emergent large vessel occlusion. 2. Atherosclerotic changes at the carotid bifurcations bilaterally without  significant stenosis. 3. Mild tortuosity of the cervical internal carotid arteries bilaterally likely reflects chronic hypertension. 4. Degenerative changes in the cervical spine. 5.  Aortic Atherosclerosis (ICD10-I70.0). Electronically Signed   By: Marin Roberts M.D.   On: 06/24/2023 21:33   CT HEAD CODE STROKE WO CONTRAST  Result Date: 06/24/2023 CLINICAL DATA:  Code stroke.  Initial evaluation for acute stroke. EXAM: CT HEAD WITHOUT CONTRAST TECHNIQUE:  Contiguous axial images were obtained from the base of the skull through the vertex without intravenous contrast. RADIATION DOSE REDUCTION: This exam was performed according to the departmental dose-optimization program which includes automated exposure control, adjustment of the mA and/or kV according to patient size and/or use of iterative reconstruction technique. COMPARISON:  Prior study from 05/07/2023 FINDINGS: Brain: Cerebral volume within normal limits. Mild chronic microvascular ischemic disease. No acute intracranial hemorrhage. No acute large vessel territory infarct. No mass lesion or midline shift. No hydrocephalus or extra-axial fluid collection. Vascular: No abnormal hyperdense vessel. Skull: Scalp soft tissues within normal limits.  Calvarium intact. Sinuses/Orbits: Globes and orbital soft tissues within normal limits. Mild mucosal thickening about the ethmoidal air cells. Paranasal sinuses are otherwise clear. No significant mastoid effusion. Other: None. ASPECTS [facility] Stroke Program Early CT Score) - Ganglionic level infarction (caudate, lentiform nuclei, internal capsule, insula, M1-M3 cortex): 7 - Supraganglionic infarction (M4-M6 cortex): 3 Total score (0-10 with 10 being normal): 10 IMPRESSION: 1. No acute intracranial abnormality. 2. Aspects is 10. 3. Mild chronic microvascular ischemic disease. These results were communicated to Dr. Wilford Corner at 9:14 pm on 06/24/2023 by text page via the [facility] messaging system. Electronically  Signed   By: Rise Mu M.D.   On: 06/24/2023 21:15     Assessment and Plan:   Abnormal telemetry, question torsades Frequent PVCs Reviewed telemetry with Dr. Tenny Craw.  Telemetry today 06/26/2023 from 11:12-11:14am.  There was initial concern for torsades, however after review of monitor strips closely it is probably artifact   There are clearly QRS complexes that occur through the period of baseline undulation, (normal beats and PVCs as seen previously)  Talking to the pt he was getting up to walk with PT at that time   Did feel dizzy  No syncope     In moving he may have created motion artifact that mimicked an arrythmia. Review of previous EKG QT interval was normal K was low    Would replete to 4 at least   Lopressor 12.5 mg started bid   Follow BP PTA was on Toprol-XL 50 mg daily.  Unsure why this was not continued, blood pressures can tolerate. Pharmacy to review medications that could potentially cause QT prolonging.  Please see note from today and consider making alternatives. Keep on tele.  Could consider a Zio patch   (he had worn one recently that showed 10% PVC burden)  .  Chronic HFpEF EF measured today 50 to 55%.  Normal RV function  Volume status difficult   Appears to be increased, though pt reports legs at baseline    Jardiance has been held for unknown reasons, continue to with if no contraindications Continue spironolactone 25 mg daily, torsemide 120 g twice daily  Again, watch K levels Was being evaluated for possible amyloidosis.  This seems less likely with a PYP indicating equivocal.  Recommendations to repeat study 1 to 2 years.  No M spike proteins.  Had elevated K FLC at 23.  CVA   Pt with 5 mm cerebellar CVA    Neuro evaluating patient  Bubble study inconclusive Further testing per neuro service   AS status post TAVR  Pt s/p TAVR in July 2024  Mean gradient in July was 17 mm Hg Today mean gradient 15 mm HG with dimensionless index 0.44     Paroxysmal  atrial fibrillation Maintaining normal sinus rhythm here. Reports 100% compliancy to Eliquis  Since small vessel disease will continue DOAC unless neuro has  concerns for cardioembolic and thinks it could be eliquis failure.   Eliquis and Lopressor 12.5 mg twice daily  Nonobstructive CAD Noted on recent cardiac catheterization.  Continue statin.  LDL 60.  Hx seizures OSA on CPAP Obesity DM Per primary team   New York Heart Association (NYHA) Functional Class NYHA Class III  CHA2DS2-VASc Score = 5  This indicates a 7.2% annual risk of stroke. The patient's score is based upon: CHF History: 1 HTN History: 1 Diabetes History: 1 Stroke History: 0 Vascular Disease History: 1 Age Score: 1 Gender Score: 0   For questions or updates, please contact [facility] Please consult [email or web address] for contact info under    Signed, Abagail Kitchens, PA-C  06/26/2023 1:58 PM   Pt seen and examined   I have amended note above by Thamas Jaegers to reflect my findings  Telemetry earlier today showed probable motion artifact and not torsede de pointes.   QRS complexed can be followed through throughout these tracings   ? Motion artifact  Last EKG QT interval not prolonged  On exam Neck full but JVP appears increased    Lungs are relatively clear  Cardiac RRR  No S3 ABd  Obese   nontender Ext are with 1+ edema    As noted above RepeatEKG Follow, replete electrolytes Continue telemetry  Reassess in am  May give additional diuresis   Dietrich Pates MD

## 2023-06-26 NOTE — Plan of Care (Signed)
  Problem: Coping: Goal: Ability to adjust to condition or change in health will improve Outcome: Progressing   Problem: Skin Integrity: Goal: Risk for impaired skin integrity will decrease Outcome: Progressing   Problem: Health Behavior/Discharge Planning: Goal: Ability to manage health-related needs will improve Outcome: Progressing   Problem: Education: Goal: Knowledge of General Education information will improve Description: Including pain rating scale, medication(s)/side effects and non-pharmacologic comfort measures Outcome: Progressing   Problem: Clinical Measurements: Goal: Respiratory complications will improve Outcome: Progressing

## 2023-06-26 NOTE — Progress Notes (Signed)
*  PRELIMINARY RESULTS* Echocardiogram 2D Echocardiogram has been performed.  Lawrence [name] 06/26/2023, 10:51 AM

## 2023-06-26 NOTE — Consult Note (Addendum)
WOC Nurse Consult Note: patient with history of chronic venous insufficiency, has followed with vascular surgery last seen in 01/2023 with orders to wear compression hose and elevate legs  Reason for Consult: L lower extremity wounds  Wound type: full thickness r/t venous insufficiency  Pressure Injury POA: NA  Measurement: 1.  L lower anterior leg 4 cm x 2 cm  x 0.1 cm full thickness 50% red 50% yellow; 3 cm x 3 cm jx 0.1 cm ust lateral to this area 75% yellow 25% red, 1 cm x 1 cm L pretibial 100% dry hemorrhagic tissue  2.  R lower anterior leg with scattered areas of what appear to be healing venous ulcers all dry scabbed  Wound bed: as above  Drainage (amount, consistency, odor) minimal serosanguinous to ulcers L leg  Periwound: edema and erythema, R greater than L  Dressing procedure/placement/frequency:  Cleanse L leg ulcers with NS, apply silver hydrofiber (Aquacel [place] [phone]) to wound bed daily, cover with ABD pad and wrap with Kerlix roll gauze beginning just above toes and ending right below knee.  Secure with Ace bandage wrapped in same fashion as Kerlix for light compression.  2.  Clean R lower leg with NS, apply Xeroform gauze [place] [phone]) to any scabbed areas, cover with dry gauze and secure with Kerlix roll gauze wrapped from just above toes to right below knee.    Patient should continue to follow-up with vascular surgeon as outpatient.    POC discussed with bedside nurse. WOC team will not follow. Re-consult if further needs arise.   Thank you,    Priscella Mann MSN, RN-BC, [employer] [phone]

## 2023-06-26 NOTE — Progress Notes (Signed)
Repeated respiration normal, no further escalation needed.    06/26/23 2312 06/26/23 2314  Assess: MEWS Score  Temp 97.7 F (36.5 C)  --   BP 122/86 122/86  MAP (mmHg) 98 98  Pulse Rate 79 86  ECG Heart Rate 79 77  Resp (!) 28 17  SpO2 93 % 93 %  O2 Device Room Air  --   Assess: MEWS Score  MEWS Temp 0 0  MEWS Systolic 0 0  MEWS Pulse 0 0  MEWS RR 2 0  MEWS LOC 0 0  MEWS Score 2 0  MEWS Score Color Yellow Green  Assess: if the MEWS score is Yellow or Red  Were vital signs accurate and taken at a resting state? Yes  --   Does the patient meet 2 or more of the SIRS criteria? No  --   Assess: SIRS CRITERIA  SIRS Temperature  0 0  SIRS Pulse 0 0  SIRS Respirations  1 0  SIRS WBC 0 0  SIRS Score Sum  1 0

## 2023-06-26 NOTE — Discharge Instructions (Addendum)
 Information on my medicine - ELIQUIS  (apixaban )  This medication education was reviewed with me or my healthcare representative as part of my discharge preparation.  Why was Eliquis  prescribed for you? Eliquis  was prescribed for you to reduce the risk of a blood clot forming that can cause a stroke if you have a medical condition called atrial fibrillation (a type of irregular heartbeat).  What do You need to know about Eliquis  ? Take your Eliquis  TWICE DAILY - one tablet in the morning and one tablet in the evening with or without food. If you have difficulty swallowing the tablet whole please discuss with your pharmacist how to take the medication safely.  Take Eliquis  exactly as prescribed by your doctor and DO NOT stop taking Eliquis  without talking to the doctor who prescribed the medication.  Stopping may increase your risk of developing a stroke.  Refill your prescription before you run out.  After discharge, you should have regular check-up appointments with your healthcare provider that is prescribing your Eliquis .  In the future your dose may need to be changed if your kidney function or weight changes by a significant amount or as you get older.  What do you do if you miss a dose? If you miss a dose, take it as soon as you remember on the same day and resume taking twice daily.  Do not take more than one dose of ELIQUIS  at the same time to make up a missed dose.  Important Safety Information A possible side effect of Eliquis  is bleeding. You should call your healthcare provider right away if you experience any of the following: Bleeding from an injury or your nose that does not stop. Unusual colored urine (red or dark brown) or unusual colored stools (red or black). Unusual bruising for unknown reasons. A serious fall or if you hit your head (even if there is no bleeding).  Some medicines may interact with Eliquis  and might increase your risk of bleeding or clotting while  on Eliquis . To help avoid this, consult your healthcare provider or pharmacist prior to using any new prescription or non-prescription medications, including herbals, vitamins, non-steroidal anti-inflammatory drugs (NSAIDs) and supplements.  This website has more information on Eliquis  (apixaban ): [email or web address] Lawrence [name]     Follow with Primary MD Lawrence Elders, PA-C in 7 days   Get CBC, CMP, 2 view Chest X ray -  checked next visit with your primary MD or CIR/SNF MD   Activity: As tolerated with Full fall precautions use walker/cane & assistance as needed  Disposition CIR/SNF  Diet: Heart Healthy Low Carb.  1.5 L fluid restriction per day.  Check CBGs q. ACH S.  Special Instructions: If you have smoked or chewed Tobacco  in the last 2 yrs please stop smoking, stop any regular Alcohol   and or any Recreational drug use.  On your next visit with your primary care physician please Get Medicines reviewed and adjusted.  Please request your Prim.MD to go over all Hospital Tests and Procedure/Radiological results at the follow up, please get all Hospital records sent to your Prim MD by signing hospital release before you go home.  If you experience worsening of your admission symptoms, develop shortness of breath, life threatening emergency, suicidal or homicidal thoughts you must seek medical attention immediately by calling 911 or calling your MD immediately  if symptoms less severe.  You Must read complete instructions/literature along with all the possible adverse reactions/side effects for all the Medicines you take and that have  been prescribed to you. Take any new Medicines after you have completely understood and accpet all the possible adverse reactions/side effects.   Do not drive when taking Pain medications.  Do not take more than prescribed Pain, Sleep and Anxiety Medications

## 2023-06-26 NOTE — Progress Notes (Signed)
PT Cancellation Note  Patient Details Name: Lawrence [name] MRN: [id number] DOB: 02/22/1956   Cancelled Treatment:    Reason Eval/Treat Not Completed: Patient at procedure or test/unavailable (Pt off the floor at echo. Will follow up if time allows)   Gladys Damme 06/26/2023, 10:34 AM

## 2023-06-26 NOTE — Progress Notes (Addendum)
STROKE TEAM PROGRESS NOTE   BRIEF HPI: [name] is a 67 y.o. male with history of Acquired dilation of ascending aorta and aortic root (HCC), Adenomatous colon polyp (2007), Anemia, Anxiety, Asthma, BPH without obstruction/lower urinary tract symptoms (02/22/2017), Chronic diastolic (congestive) heart failure (HCC), Chronic venous stasis (03/07/2019), COPD (chronic obstructive pulmonary disease) (HCC), Coronary artery calcification seen on CAT scan, Depression, Diabetic neuropathy (HCC) (09/11/2019), History of colon polyps (08/24/2018), Hypertension, Morbid obesity (HCC), OSA (obstructive sleep apnea), Pain due to onychomycosis of toenails of both feet (09/11/2019), Peripheral neuropathy (02/22/2017), Primary osteoarthritis, left shoulder (03/05/2017), PTSD (post-traumatic stress disorder), Pure hypercholesterolemia, QT prolongation (03/07/2019), S/P TAVR (transcatheter aortic valve replacement) (02/07/2023), Seizures (HCC), Severe aortic stenosis, Sinus tachycardia (03/07/2019), Sleep apnea, and Type 2 diabetes mellitus with vascular disease (HCC) (09/11/2019).   Has residual left-sided weakness from prior strokes, brought in by EMS when they were called to evaluate him for unresponsiveness.  Last known well was 5:30 PM.  Patient was found unresponsive on his recliner.  He had to be sternal rubbed before he came around.  No witnessed seizure activity.  On coming around, it was noted that he was weak on the left side and had slurred speech and was slow to respond to questions.  Given the fact that he was on Eliquis and had prior strokes, code stroke was activated and he was brought in by emergency traffic to the ER. Evaluated at the bridge.  See detailed exam below.   Evaluated later in the room-had some neck shaking but was awake during that episode-it almost looks like he is volitionally shaking his head to say no with increasing frequency.  No postictal state following this.  No tongue biting.   No urinary incontinence   LKW: 5:30 PM 06/24/2023 Modified rankin score: 4-Needs assistance to walk and tend to bodily needs IV Thrombolysis: No-on Eliquis EVT: No ELVO  NIH on Admission 9  SIGNIFICANT HOSPITAL EVENTS MRI w/ acute left cerebellar stroke  INTERIM HISTORY/SUBJECTIVE Patient continues to have left sided weakness. Feels that it is worsening. Having difficulty lifting left leg against gravity this morning and has decreased sensation. At baseline has some chronic left hemiparesis due to prior stroke. Can move around house with rollator and make some food for himself. Requires some assistance with showering.   OBJECTIVE  CBC    Component Value Date/Time   WBC 9.3 06/26/2023 0405   RBC 4.49 06/26/2023 0405   HGB 13.1 06/26/2023 0405   HCT 40.7 06/26/2023 0405   PLT 277 06/26/2023 0405   MCV 90.6 06/26/2023 0405   MCH 29.2 06/26/2023 0405   MCHC 32.2 06/26/2023 0405   RDW 17.2 (H) 06/26/2023 0405   LYMPHSABS 1.6 06/26/2023 0405   MONOABS 0.7 06/26/2023 0405   EOSABS 0.4 06/26/2023 0405   BASOSABS 0.0 06/26/2023 0405    BMET    Component Value Date/Time   NA 139 06/26/2023 0405   NA 136 02/15/2023 1415   K 3.2 (L) 06/26/2023 0405   CL 101 06/26/2023 0405   CO2 29 06/26/2023 0405   GLUCOSE 101 (H) 06/26/2023 0405   BUN 27 (H) 06/26/2023 0405   BUN 37 (H) 02/15/2023 1415   CREATININE 1.29 (H) 06/26/2023 0405   CALCIUM 8.4 (L) 06/26/2023 0405   EGFR 78 02/15/2023 1415   GFRNONAA >60 06/26/2023 0405    IMAGING past 24 hours No results found.  Vitals:   06/25/23 2024 06/26/23 0000 06/26/23 0400 06/26/23 0742  BP:  138/89  130/83 112/74  Pulse:  85 82 79  Resp:  16 20 16   Temp:  97.9 F (36.6 C) 98.3 F (36.8 C) 98.5 F (36.9 C)  TempSrc:  Oral Axillary Oral  SpO2: 93% 91% 92% 92%  Weight:      Height:         PHYSICAL EXAM General:  Alert, well-nourished, well-developed patient in no acute distress Psych:  Mood and affect appropriate for  situation Respiratory:  Regular, unlabored respirations on 3L  GI: Abdomen soft and nontender   NEURO:  Mental Status: AA&Ox3, patient is able to give clear and coherent history Speech/Language: speech is without dysarthria or aphasia.  Cranial Nerves:  II: PERRL. Visual fields full.  III, IV, VI: EOMI. Eyelids elevate symmetrically.  V: Sensation decreased in the left VII: Face is symmetrical resting and smiling VIII: hearing intact to voice. IX, X: Palate elevates symmetrically. Phonation is normal.  GE:[id number] shrug 5/5. XII: tongue is midline without fasciculations. Motor: Left sided weakness in upper, w/ pronation and mild drift 3/5, lower extremity  1/5   Tone: is normal and bulk is normal Sensation- Decreased sensation on the left side. Extinction absent to light touch to DSS.   Coordination: FTN intact bilaterally delayed in left compared to right  Gait- deferred Most Recent NIH 3   ASSESSMENT/PLAN  Acute small punctate left cerebellar infarct likely clinically silent but has worsening of existing left hemiparesis Etiology:  Embolic from A-fib despite on anticoagulation with Eliquis Code Stroke CT head No acute abnormality. Chronic small vessel disease. ASPECTS 10.    CTA head & neck No LVO MRI  5 mm acute ischemic nonhemorrhagic left cerebellar infarct.  2D Echo w/ bubble study EF 50-55%, borderline LVH, LA normal, bubble study inconclusive, septum not assessed  LDL 79 HgbA1c 6.4 VTE prophylaxis - Eliquis  Eliquis 5 mg BID prior to admission and will continue PT/OT/ recommending CIR  SLP evaluations pending  Disposition:  Pending   Hx of Stroke/TIA Residual left sided hemiparesis from prior strokes  Home meds: Eliquis 5 mg BID, Lipitor 40 mg daily    Atrial fibrillation Home Meds: Eliquis 5 mg BID and metoprolol 50 mg daily (discontinued)  Continue telemetry monitoring Continue home eliquis  Started metoprolol tartate 12.5 mg BID   Hypertension Home meds:   Metoprolol succinate 50 mg daily (discontinued), spironolactone 25 mg daily Stable Normotensive BP goal Resumed home spiro Started metoprolol tartate 12.5 mg BID    Hyperlipidemia Home meds:  Lipitor 40 mg fenofibrate 160 mg daily resumed in hospital LDL 79, goal < 70  Continue fenofibrate, d/c lipitor and transitioned to crestor 20 mg daily  Continue statin at discharge  Type 2 Diabetes Controlled  Home meds:  Metformin, Evaristo Bury, Novolog. Mounjaro HgbA1c 6.5, goal < 7.0 CBGs Semglee 40 units daily and SSI per primary team  Recommend close follow-up with PCP for better DM control  Other Stroke Risk Factors Obesity, Body mass index is 47.89 kg/m., BMI >/= 30 associated with increased stroke risk, recommend weight loss, diet and exercise as appropriate  Congestive heart failure: Continue metoprolol tartate 12.5 mg BID, spironolactone 25 mg daily, torsemide 120 mg BID  Obstructive sleep apnea, non-compliant with CPAP overgnight   Other Active Problems Seizures: Resumed Keppra 500 mg BID and vimpat 150 mg BID  Peripheral neuropathy: Hold Pregabalin 150 mg TID daily  Depression: Resumed venlafaxine and abilify, hold trazodone  Anxiety: Resumed Buspar 10 mg TID daily  Gout:  Switched to colchicine  COPD: Trelegy, flonase and zyrtec resumed   Hospital day # 2  I have personally obtained history,examined this patient, reviewed notes, independently viewed imaging studies, participated in medical decision making and plan of care.ROS completed by me personally and pertinent positives fully documented  I have made any additions or clarifications directly to the above note. Agree with note above.  Patient history of A-fib on anticoagulation with Eliquis and previous stroke with residual left hemiparesis presented with worsening of old deficits MRI shows no new right hemispheric infarct but shows incidental small punctate cerebellar embolic infarct.  Continue Eliquis as there is no definitive  data suggesting switching to Xarelto or Pradaxa as necessary still..  Continue physical occupational therapy.  Mobilize out of bed.  Likely need transfer to rehab.  Discussed with Dr. Thedore Mins.  Greater than 50% time during this 35-minute visit was spent in counseling and coordination of care about his embolic stroke and hemiparesis and discussion about treatment and answering questions.  Stroke team will sign off.  Kindly call for questions  Delia Heady, MD Medical Director [facility] Pager: [phone] 06/26/2023 3:41 PM  To contact Stroke Continuity provider, please refer to [email or web address]. After hours, contact General Neurology

## 2023-06-26 NOTE — Evaluation (Signed)
Occupational Therapy Evaluation Patient Details Name: Lawrence [name] MRN: [id number] DOB: 04-Nov-1955 Today's Date: 06/26/2023   History of Present Illness 67 y.o. male with atrial fibrillation on anticoagulation, prior CVA with residual left-sided weakness, type 2 diabetes with neuropathy, seizure disorder on antiepileptics, CAD, COPD, OSA, history of severe aortic stenosis status post TAVR who presented to the ER from home on 11/16 via EMS as a code stroke. MRI (+) acute ischemic nonhemorrhagic left cerebellar infarct.   Clinical Impression   PTA pt lives with his wife and has a PCA 4 days/week who assists with ADL and IADL tasks. On "non-PCA" days, pt's wife assits with ADL/IADL tasks as needed. At baseline, Lawrence [name] mobilizes using his rollator in the home and his scotter in the community. He enjoys being involved in the community and recently started attending a "drum session" at a senior center.  Lawrence [name] requires mod A with mobility and Mod to max A with ADL tasks due to below listed deficits. Do not feel Lawrence [name] is at his baseline and would benefit from Patient will benefit from intensive inpatient follow up therapy, >3 hours/day to maximize functional level of independence to facilitate safe DC home. On return to room, pt with HR into the 200s with R hand shaking. Nsg made aware and came to the room to assist the pt. Acute OT to follow.       If plan is discharge home, recommend the following: A lot of help with walking and/or transfers;A lot of help with bathing/dressing/bathroom;Assist for transportation    Functional Status Assessment  Patient has had a recent decline in their functional status and demonstrates the ability to make significant improvements in function in a reasonable and predictable amount of time.  Equipment Recommendations  None recommended by OT    Recommendations for Other Services Rehab consult     Precautions / Restrictions Precautions Precautions: Fall Precaution  Comments: watch HR adn O2      Mobility Bed Mobility Overal bed mobility: Needs Assistance Bed Mobility: Supine to Sit     Supine to sit: Supervision, HOB elevated, Used rails          Transfers Overall transfer level: Needs assistance Equipment used: Rolling walker (2 wheels) Transfers: Sit to/from Stand, Bed to chair/wheelchair/BSC Sit to Stand: Mod assist, +2 physical assistance     Step pivot transfers: Min assist, +2 physical assistance     General transfer comment: progressed to min A      Balance Overall balance assessment: Needs assistance   Sitting balance-Leahy Scale: Fair       Standing balance-Leahy Scale: Poor                             ADL either performed or assessed with clinical judgement   ADL Overall ADL's : Needs assistance/impaired Eating/Feeding: Modified independent   Grooming: Set up;Sitting   Upper Body Bathing: Minimal assistance;Sitting   Lower Body Bathing: Maximal assistance;Sit to/from stand   Upper Body Dressing : Moderate assistance   Lower Body Dressing: Maximal assistance;Sit to/from stand   Toilet Transfer: Moderate assistance;+2 for physical assistance;Stand-pivot;Rolling walker (2 wheels)   Toileting- Clothing Manipulation and Hygiene: Maximal assistance Toileting - Clothing Manipulation Details (indicate cue type and reason): incontinent of urine at times; uses urinal frequently     Functional mobility during ADLs: Moderate assistance;+2 for safety/equipment (progressed to min A +2 for safety)       Vision Baseline Vision/History: 1  Wears glasses Patient Visual Report: No change from baseline Vision Assessment?: Yes Eye Alignment: Within Functional Limits Ocular Range of Motion: Within Functional Limits Alignment/Gaze Preference: Within Defined Limits Tracking/Visual Pursuits: Decreased smoothness of horizontal tracking;Decreased smoothness of vertical tracking Saccades: Additional eye shifts  occurred during testing Convergence: Within functional limits Visual Fields: No apparent deficits     Perception Perception: Within Functional Limits       Praxis Praxis: WFL       Pertinent Vitals/Pain Pain Assessment Pain Assessment: Faces Faces Pain Scale: Hurts little more Pain Location: L foot Pain Descriptors / Indicators: Burning Pain Intervention(s): Limited activity within patient's tolerance     Extremity/Trunk Assessment Upper Extremity Assessment Upper Extremity Assessment: LUE deficits/detail LUE Deficits / Details: AROM overall WFL; generaly weaker than R but attempting ot use funcitonally; isolated movement patterns LUE Coordination: decreased fine motor   Lower Extremity Assessment Lower Extremity Assessment: Defer to PT evaluation   Cervical / Trunk Assessment Cervical / Trunk Assessment: Other exceptions (forward head; increased body habitus)   Communication Communication Communication: No apparent difficulties   Cognition Arousal: Alert Behavior During Therapy: WFL for tasks assessed/performed Overall Cognitive Status:  (will further assess; most likely close to baseline)                                       General Comments  BLE chronic leg wounds    Exercises Exercises: Other exercises Other Exercises Other Exercises: smooth pursuits head static   Shoulder Instructions      Home Living Family/patient expects to be discharged to:: Private residence Living Arrangements: Spouse/significant other Available Help at Discharge: Family;Available 24 hours/day;Personal care attendant Type of Home: Apartment Home Access: Level entry     Home Layout: One level     Bathroom Shower/Tub: Chief Strategy Officer: Standard Bathroom Accessibility: Yes How Accessible: Accessible via walker Home Equipment: Rollator (4 wheels);Electric scooter;Hospital bed (lift chair)   Additional Comments: Has a PCA 4 days a week for  about 4 hours a day (13 hour week); wears CPAP at night, no O2 at home      Prior Functioning/Environment Prior Level of Function : Needs assist       Physical Assist : ADLs (physical)   ADLs (physical): Bathing;Dressing Mobility Comments: Rollator for household mobility (short distances); Art gallery manager in community dwellings. ADLs Comments: aide assists with bathing/dressing, provides set up A for grooming tasks, light household chores,  utilizes transportation services. uses BSC does not go into bathroom; enjoys going into the community        OT Problem List: Decreased strength;Decreased activity tolerance;Impaired balance (sitting and/or standing);Decreased safety awareness;Decreased knowledge of use of DME or AE;Decreased knowledge of precautions;Obesity;Impaired UE functional use      OT Treatment/Interventions: Self-care/ADL training;Therapeutic exercise;Neuromuscular education;DME and/or AE instruction;Therapeutic activities;Patient/family education;Balance training;Visual/perceptual remediation/compensation    OT Goals(Current goals can be found in the care plan section) Acute Rehab OT Goals Patient Stated Goal: to get stronger OT Goal Formulation: With patient Time For Goal Achievement: 07/16/23 Potential to Achieve Goals: Good  OT Frequency: Min 1X/week    Co-evaluation PT/OT/SLP Co-Evaluation/Treatment: Yes Reason for Co-Treatment: To address functional/ADL transfers;For patient/therapist safety   OT goals addressed during session: ADL's and self-care      AM-PAC OT "6 Clicks" Daily Activity     Outcome Measure Help from another person eating meals?: None Help from another person  taking care of personal grooming?: A Little Help from another person toileting, which includes using toliet, bedpan, or urinal?: A Lot Help from another person bathing (including washing, rinsing, drying)?: A Lot Help from another person to put on and taking off regular upper body  clothing?: A Lot Help from another person to put on and taking off regular lower body clothing?: A Lot 6 Click Score: 15   End of Session Equipment Utilized During Treatment: Gait belt;Rolling walker (2 wheels);Oxygen (2L) Nurse Communication: Mobility status;Other (comment) (HR; R hand shaking)  Activity Tolerance: Patient tolerated treatment well;Other (comment) (Pt with increased HR end of session) Patient left: in chair;with call bell/phone within reach;with chair alarm set  OT Visit Diagnosis: Unsteadiness on feet (R26.81);Other abnormalities of gait and mobility (R26.89);Muscle weakness (generalized) (M62.81);Dizziness and giddiness (R42)                Time: 8657-8469 OT Time Calculation (min): 44 min Charges:  OT General Charges $OT Visit: 1 Visit OT Evaluation $OT Eval Moderate Complexity: 1 Mod OT Treatments $Self Care/Home Management : 8-22 mins  Luisa Dago, OT/L   Acute OT Clinical Specialist Acute Rehabilitation Services Pager [phone] Office [phone]   [facility] 06/26/2023, 12:53 PM

## 2023-06-26 NOTE — Progress Notes (Addendum)
PROGRESS NOTE                                                                                                                                                                                                             Patient Demographics:    Lawrence [name], is a 67 y.o. male, DOB - 1955/11/20, OZH:[id number]  Outpatient Primary MD for the patient is Lawrence Soho, PA-C    LOS - 1  Admit date - 06/24/2023    Chief Complaint  Patient presents with   Weakness       Brief Narrative (HPI from H&P)   67 y.o. male with atrial fibrillation on anticoagulation, prior CVA with residual left-sided weakness, type 2 diabetes with neuropathy, seizure disorder on antiepileptics, CAD, COPD, OSA, history of severe aortic stenosis status post TAVR who presented to the ER on 11/16 via EMS as a code stroke.  Reportedly patient's family found him unresponsive in his recliner.  On arrival to the ER patient was hemodynamically stable with mild AKI.  CT head/CTA head and neck were largely within normal limits.  Neurology was consulted.  Brain MRI revealed 5 mm acute ischemic nonhemorrhagic left cerebellar infarct.  Patient was admitted to [facility] on 11/17 and diagnosed with another acute stroke.   Subjective:    Lawrence [name] today has, No headache, No chest pain, No abdominal pain - No Nausea, No new weakness tingling or numbness, no shortness of breath, left lower extremity weakness is still present.   Assessment  & Plan :   Acute CVA MRI with 5 mm acute ischemic left cerebellar infarct, second such stroke in the last few months, MRI noted, CTA noted, recent 2D echo noted, new echo pending, neurology on board.  Continue Eliquis, statin dose adjusted for better LDL control, A1c stable, PT OT, may require SNF.  History of CVA At baseline patient does have some left lower extremity weakness though he was previously ambulatory with a rollator,  Home  meds as above   Acute respiratory failure - Patient not on home O2,  Chest x-ray without acute airspace disease, Suspect current etiology is due to obesity hypoventilation syndrome with acute deconditioning given current CVA, Continue supplemental O2, wean as tolerated in daytime, nighttime CPAP, outpatient pulmonary follow-up and sleep study.   Chr. Atrial fibrillation.  [place] vas 2 score of greater  than 5.  Continue Eliquis, rate in good control, blood pressure too low for beta-blocker right now.     Addendum - asymptomatic VT > 20 beat, 06/25/10.25 am - Mag 2 gms, KCL repeat, EF > 55%, B Blcoker once BP allows, Cards called  OSA  - CPAP at night   Chronic bilateral lower extremity venous stasis ulcers.  Wound care, resume diuretics .  Acute metabolic encephalopathy  - Present on arrival, now resolved to baseline   Chronic diastolic heart failure EF 60% on recent echocardiogram- Home med spironolactone, torsemide on hold given low BP and desires for permissive hypertension as above,  Continue Eliquis, statin, fenofibrate.  Resume diuretics.   COPD  - Continue Trelegy, Flonase, Zyrtec   Hypokalemia - replaced  Depression  - Continue all home meds   Hypertension  - Permissive hypertension , gradual addition on B Blocker   Gout flare  - Right great toe gout flare, colchicine, hold allopurinol as he has acute flare, as needed pain medications.   BMI 47  - Outpatient follow up for lifestyle modification and risk factor management  Type 2 diabetes with peripheral neuropathy - Sliding scale insulin, basal bolus as tolerated  Lab Results  Component Value Date   HGBA1C 6.4 (A) 05/25/2023   CBG (last 3)  Recent Labs    06/25/23 1700 06/25/23 2129 06/26/23 0742  GLUCAP 145* 99 124*            Condition - Extremely Guarded  Family Communication  :  None present  Code Status :  Full  Consults  :  Neurology  PUD Prophylaxis :  PPI   Procedures  :     TTE 03/10/23 -  1.  Left ventricular ejection fraction, by estimation, is 55 to 60%. The  left ventricle has normal function. The left ventricle has no regional  wall motion abnormalities. Left ventricular diastolic parameters are  consistent with Grade I diastolic  dysfunction (impaired relaxation).   2. Right ventricular systolic function is normal. The right ventricular  size is normal.   3. Left atrial size was moderately dilated.   4. The mitral valve is normal in structure. No evidence of mitral valve  regurgitation. No evidence of mitral stenosis.   5. The aortic valve has been repaired/replaced. There is moderate  calcification of the aortic valve. There is moderate thickening of the  aortic valve. Aortic valve regurgitation is not visualized. Mild aortic  valve stenosis. There is a TAVR-29mm  Medtonic Evolut FX valve present in the aortic position. Aortic valve mean  gradient measures 15.2 mmHg. Aortic valve Vmax measures 2.45 m/s.   6. Aortic dilatation noted. There is mild dilatation of the ascending  aorta, measuring 41 mm.   7. The inferior vena cava is normal in size with greater than 50%  respiratory variability, suggesting right atrial pressure of 3 mmHg.   MRI - 1.  5 mm acute ischemic nonhemorrhagic left cerebellar infarct. 2. Underlying mild age-related cerebral volume loss with mild chronic microvascular ischemic disease.  CTA - 1. No emergent large vessel occlusion. 2. Atherosclerotic changes at the carotid bifurcations bilaterally without significant stenosis. 3. Mild tortuosity of the cervical internal carotid arteries bilaterally likely reflects chronic hypertension. 4. Degenerative changes in the cervical spine. 5.  Aortic Atherosclerosis (ICD10-I70.0).      Disposition Plan  :    Status is: Inpatient  DVT Prophylaxis  :     apixaban (ELIQUIS) tablet 5 mg  Lab Results  Component Value Date   PLT 277 06/26/2023    Diet :  Diet Order             Diet heart  healthy/carb modified Room service appropriate? Yes; Fluid consistency: Thin  Diet effective now                    Inpatient Medications  Scheduled Meds:   stroke: early stages of recovery book   Does not apply Once   apixaban  5 mg Oral BID   ARIPiprazole  5 mg Oral Daily   busPIRone  10 mg Oral TID   cholecalciferol  2,000 Units Oral Daily   colchicine  0.3 mg Oral BID   cyanocobalamin  1,000 mcg Oral Daily   fenofibrate  160 mg Oral Daily   ferrous sulfate  325 mg Oral Q breakfast   fluticasone furoate-vilanterol  1 puff Inhalation Daily   And   umeclidinium bromide  1 puff Inhalation Daily   icosapent Ethyl  2 g Oral BID   insulin aspart  0-15 Units Subcutaneous TID WC   insulin aspart  0-5 Units Subcutaneous QHS   insulin aspart  12 Units Subcutaneous TID WC   insulin glargine-yfgn  40 Units Subcutaneous Q24H   lacosamide  150 mg Oral BID   levETIRAcetam  500 mg Oral BID   pantoprazole  40 mg Oral Daily   pregabalin  150 mg Oral TID   rosuvastatin  20 mg Oral Daily   spironolactone  25 mg Oral Daily   torsemide  120 mg Oral BID   venlafaxine XR  225 mg Oral Q breakfast   Continuous Infusions: PRN Meds:.acetaminophen **OR** acetaminophen (TYLENOL) oral liquid 160 mg/5 mL **OR** acetaminophen, HYDROcodone-acetaminophen, metoprolol tartrate  Antibiotics  :    Anti-infectives (From admission, onward)    None         Objective:   Vitals:   06/25/23 2024 06/26/23 0000 06/26/23 0400 06/26/23 0742  BP:  138/89 130/83 112/74  Pulse:  85 82 79  Resp:  16 20 16   Temp:  97.9 F (36.6 C) 98.3 F (36.8 C) 98.5 F (36.9 C)  TempSrc:  Oral Axillary Oral  SpO2: 93% 91% 92% 92%  Weight:      Height:        Wt Readings from Last 3 Encounters:  06/24/23 (!) 169.2 kg  06/09/23 (!) 156.9 kg  05/25/23 (!) 156.9 kg     Intake/Output Summary (Last 24 hours) at 06/26/2023 1012 Last data filed at 06/26/2023 0744 Gross per 24 hour  Intake --  Output 3130 ml   Net -3130 ml     Physical Exam  Awake Alert, No new F.N deficits, left lower extremity 4/5 Andrews.AT,PERRAL Supple Neck, No JVD,   Symmetrical Chest wall movement, Good air movement bilaterally, CTAB RRR,No Gallops,Rubs or new Murmurs,  +ve B.Sounds, Abd Soft, No tenderness,   No Cyanosis, Clubbing or edema        Data Review:    Recent Labs  Lab 06/24/23 2107 06/24/23 2109 06/24/23 2155 06/25/23 0426 06/26/23 0405  WBC  --  9.0  --  9.9 9.3  HGB 15.0 14.0 13.3 12.9* 13.1  HCT 44.0 43.8 39.0 40.6 40.7  PLT  --  265  --  263 277  MCV  --  90.5  --  91.4 90.6  MCH  --  28.9  --  29.1 29.2  MCHC  --  32.0  --  31.8 32.2  RDW  --  17.3*  --  17.2* 17.2*  LYMPHSABS  --  1.8  --   --  1.6  MONOABS  --  0.8  --   --  0.7  EOSABS  --  0.5  --   --  0.4  BASOSABS  --  0.1  --   --  0.0    Recent Labs  Lab 06/24/23 2107 06/24/23 2109 06/24/23 2155 06/24/23 2328 06/25/23 0426 06/26/23 0405  NA 139 139 140  --  140 139  K 5.3* 4.3 3.1*  --  3.2* 3.2*  CL 103 102  --   --  106 101  CO2  --  25  --   --  26 29  ANIONGAP  --  12  --   --  8 9  GLUCOSE 80 82  --   --  109* 101*  BUN 44* 29*  --   --  30* 27*  CREATININE 1.30* 1.18  --   --  1.14 1.29*  AST  --  30  --   --  15 13*  ALT  --  23  --   --  18 17  ALKPHOS  --  56  --   --  45 41  BILITOT  --  1.0  --   --  0.4 0.4  ALBUMIN  --  3.7  --   --  3.3* 3.3*  INR  --  1.0  --   --   --   --   TSH  --   --   --  1.561  --   --   MG  --   --   --   --   --  2.3  CALCIUM  --  9.6  --   --  8.9 8.4*      Recent Labs  Lab 06/24/23 2109 06/24/23 2328 06/25/23 0426 06/26/23 0405  INR 1.0  --   --   --   TSH  --  1.561  --   --   MG  --   --   --  2.3  CALCIUM 9.6  --  8.9 8.4*    --------------------------------------------------------------------------------------------------------------- Lab Results  Component Value Date   CHOL 151 06/25/2023   HDL 30 (L) 06/25/2023   LDLCALC 79 06/25/2023   TRIG  209 (H) 06/25/2023   CHOLHDL 5.0 06/25/2023    Lab Results  Component Value Date   HGBA1C 6.4 (A) 05/25/2023   Recent Labs    06/24/23 2328  TSH 1.561     Radiology Reports MR BRAIN WO CONTRAST  Result Date: 06/25/2023 CLINICAL DATA:  Initial evaluation for neuro deficit, stroke suspected. EXAM: MRI HEAD WITHOUT CONTRAST TECHNIQUE: Multiplanar, multiecho pulse sequences of the brain and surrounding structures were obtained without intravenous contrast. COMPARISON:  Prior CTs from 06/24/2023. FINDINGS: Brain: Mild age-related cerebral volume loss. Few scattered subcentimeter foci of T2/FLAIR signal intensity seen involving the supratentorial cerebral white matter, most like related chronic microvascular ischemic disease, less than is typically seen for patient age. 5 mm focus of restricted diffusion seen involving the left cerebellum (series 5, image 57), consistent with a small acute ischemic nonhemorrhagic infarct. No other evidence for acute or subacute ischemia elsewhere. No acute or chronic intracranial blood products. No mass lesion, midline shift or mass effect. No hydrocephalus or extra-axial fluid collection. Pituitary gland suprasellar region within normal limits. Vascular: Major intracranial vascular flow voids are maintained.  Skull and upper cervical spine: Craniocervical junction within normal limits. Bone marrow signal intensity normal. No scalp soft tissue abnormality. Sinuses/Orbits: Globes and orbital soft tissues within normal limits. Mild mucosal thickening present about the sphenoid ethmoidal sinuses. No mastoid effusion. Other: None. IMPRESSION: 1. 5 mm acute ischemic nonhemorrhagic left cerebellar infarct. 2. Underlying mild age-related cerebral volume loss with mild chronic microvascular ischemic disease. Electronically Signed   By: Rise Mu M.D.   On: 06/25/2023 01:14   DG Chest Portable 1 View  Result Date: 06/24/2023 CLINICAL DATA:  Altered level of  consciousness EXAM: PORTABLE CHEST 1 VIEW COMPARISON:  05/15/2023 FINDINGS: Single frontal view of the chest demonstrates a stable cardiac silhouette. Chronic scarring at the lung bases. No acute airspace disease, effusion, or pneumothorax. No acute bony abnormalities. IMPRESSION: 1. Chronic bibasilar scarring.  No acute airspace disease. Electronically Signed   By: Sharlet Salina M.D.   On: 06/24/2023 22:52   CT ANGIO HEAD NECK W WO CM  Result Date: 06/24/2023 CLINICAL DATA:  Stroke/TIA. Determine embolic source. Left-sided facial droop. EXAM: CT ANGIOGRAPHY HEAD AND NECK WITH AND WITHOUT CONTRAST TECHNIQUE: Multidetector CT imaging of the head and neck was performed using the standard protocol during bolus administration of intravenous contrast. Multiplanar CT image reconstructions and MIPs were obtained to evaluate the vascular anatomy. Carotid stenosis measurements (when applicable) are obtained utilizing NASCET criteria, using the distal internal carotid diameter as the denominator. RADIATION DOSE REDUCTION: This exam was performed according to the departmental dose-optimization program which includes automated exposure control, adjustment of the mA and/or kV according to patient size and/or use of iterative reconstruction technique. CONTRAST:  75mL OMNIPAQUE IOHEXOL 350 MG/ML SOLN COMPARISON:  CT head without contrast 06/24/2023. CT angio head and neck 05/07/2023. FINDINGS: CTA NECK FINDINGS Aortic arch: Atherosclerotic calcifications are present at the aortic arch without significant stenosis relative to the distal vessel. A scratched at the left common carotid artery shares a common origin with the innominate artery. Right carotid system: The right common carotid artery is within normal limits. Atherosclerotic calcifications are present at the bifurcation without significant stenosis. Mild tortuosity is present in the cervical left ICA. Left carotid system: The left common carotid artery is within  normal limits. Atherosclerotic calcifications are present at the bifurcation. No significant stenosis is present. Mild tortuosity is present the cervical left ICA. Vertebral arteries: The left vertebral artery is the dominant vessel. Both vertebral arteries originate from the subclavian arteries without significant stenosis. Significant stenosis is present either vertebral artery in. Skeleton: Neck level degenerative changes are again noted in the cervical spine. No focal osseous lesions are present. Other neck: Soft tissues the neck are otherwise unremarkable. Salivary glands are within normal limits. Thyroid is normal. No significant adenopathy is present. No focal mucosal or submucosal lesions are present. Upper chest: Mild dependent atelectasis is present in the lungs bilaterally. Review of the MIP images confirms the above findings CTA HEAD FINDINGS Anterior circulation: The internal carotid arteries are within normal limits from the skull base to the ICA termini. The A1 and M1 segments are normal. The anterior communicating artery is patent. MCA bifurcations are normal. The ACA and MCA branch vessels are within normal limits. No aneurysm is present. Posterior circulation: Left vertebral artery is dominant vessel. The PICA origins are visualized and normal. The vertebrobasilar junction and basilar artery are normal. The superior cerebellar arteries are patent. Both posterior cerebral arteries originate from basilar tip. PCA branch vessels are normal bilaterally. Venous sinuses: The dural  sinuses are patent. The straight sinus and deep cerebral veins are intact. Cortical veins are within normal limits. No significant vascular malformation is evident. Anatomic variants: None Review of the MIP images confirms the above findings IMPRESSION: 1. No emergent large vessel occlusion. 2. Atherosclerotic changes at the carotid bifurcations bilaterally without significant stenosis. 3. Mild tortuosity of the cervical  internal carotid arteries bilaterally likely reflects chronic hypertension. 4. Degenerative changes in the cervical spine. 5.  Aortic Atherosclerosis (ICD10-I70.0). Electronically Signed   By: Marin Roberts M.D.   On: 06/24/2023 21:33   CT HEAD CODE STROKE WO CONTRAST  Result Date: 06/24/2023 CLINICAL DATA:  Code stroke.  Initial evaluation for acute stroke. EXAM: CT HEAD WITHOUT CONTRAST TECHNIQUE: Contiguous axial images were obtained from the base of the skull through the vertex without intravenous contrast. RADIATION DOSE REDUCTION: This exam was performed according to the departmental dose-optimization program which includes automated exposure control, adjustment of the mA and/or kV according to patient size and/or use of iterative reconstruction technique. COMPARISON:  Prior study from 05/07/2023 FINDINGS: Brain: Cerebral volume within normal limits. Mild chronic microvascular ischemic disease. No acute intracranial hemorrhage. No acute large vessel territory infarct. No mass lesion or midline shift. No hydrocephalus or extra-axial fluid collection. Vascular: No abnormal hyperdense vessel. Skull: Scalp soft tissues within normal limits.  Calvarium intact. Sinuses/Orbits: Globes and orbital soft tissues within normal limits. Mild mucosal thickening about the ethmoidal air cells. Paranasal sinuses are otherwise clear. No significant mastoid effusion. Other: None. ASPECTS [facility] Stroke Program Early CT Score) - Ganglionic level infarction (caudate, lentiform nuclei, internal capsule, insula, M1-M3 cortex): 7 - Supraganglionic infarction (M4-M6 cortex): 3 Total score (0-10 with 10 being normal): 10 IMPRESSION: 1. No acute intracranial abnormality. 2. Aspects is 10. 3. Mild chronic microvascular ischemic disease. These results were communicated to Dr. Wilford Corner at 9:14 pm on 06/24/2023 by text page via the [facility] messaging system. Electronically Signed   By: Rise Mu M.D.   On: 06/24/2023 21:15       Signature  -   Susa Raring M.D on 06/26/2023 at 10:12 AM   -  To page go to [email or web address]

## 2023-06-26 NOTE — Progress Notes (Signed)
SLP Cancellation Note  Patient Details Name: Lawrence [name] MRN: [id number] DOB: 03/05/1956   Cancelled treatment:       Reason Eval/Treat Not Completed: Patient at procedure or test/unavailable; nursing deferred eval; ST will continue efforts.   Pat [name],M.S.,CCC-SLP 06/26/2023, 1:45 PM

## 2023-06-26 NOTE — Evaluation (Signed)
Physical Therapy Evaluation Patient Details Name: Lawrence [name] MRN: [id number] DOB: [birth year] Today's Date: 06/26/2023  History of Present Illness  67 y.o. male with atrial fibrillation on anticoagulation, prior CVA with residual left-sided weakness, type 2 diabetes with neuropathy, seizure disorder on antiepileptics, CAD, COPD, OSA, history of severe aortic stenosis status post TAVR who presented to the ER from home on 11/16 via EMS as a code stroke. MRI (+) acute ischemic nonhemorrhagic left cerebellar infarct.  Clinical Impression  Pt presents with admitting diagnosis above. Co-treat with OT.  Pt today was able to transfer to chair and take a few steps in room with RW Min A +2 for safety. PTA pt reports that he was Mod I with rollator. At end of session pt noted with increased shakiness in R arm which pt attributed to a seizure. HR up to 229 with tele reading VTach/ VFib. RN called into room and vitals returned to WNL after ~3 minutes. Pt remained conversive and appeared to be in NAD. Patient will benefit from intensive inpatient follow up therapy, >3 hours/day. PT will continue to follow.         If plan is discharge home, recommend the following: A lot of help with walking and/or transfers;A little help with bathing/dressing/bathroom;Assistance with cooking/housework;Assist for transportation;Help with stairs or ramp for entrance   Can travel by private vehicle        Equipment Recommendations Other (comment) (Per accepting facility)  Recommendations for Other Services       Functional Status Assessment Patient has had a recent decline in their functional status and demonstrates the ability to make significant improvements in function in a reasonable and predictable amount of time.     Precautions / Restrictions Precautions Precautions: Fall Precaution Comments: watch HR adn O2 Restrictions Weight Bearing Restrictions: No      Mobility  Bed Mobility Overal bed mobility:  Needs Assistance Bed Mobility: Supine to Sit     Supine to sit: Supervision, HOB elevated, Used rails          Transfers Overall transfer level: Needs assistance Equipment used: Rolling walker (2 wheels) Transfers: Sit to/from Stand, Bed to chair/wheelchair/BSC Sit to Stand: Mod assist, +2 physical assistance   Step pivot transfers: Min assist, +2 physical assistance       General transfer comment: progressed to min A    Ambulation/Gait Ambulation/Gait assistance: Min assist, +2 safety/equipment Gait Distance (Feet): 6 Feet Assistive device: Rolling walker (2 wheels) Gait Pattern/deviations: Knee hyperextension - right, Decreased stride length, Step-through pattern, Trunk flexed Gait velocity: decreased     General Gait Details: Pt with continued extreme R knee hyperextension. +2 for safety as pt noted to fatigue very quickly.  Stairs            Wheelchair Mobility     Tilt Bed    Modified Rankin (Stroke Patients Only)       Balance Overall balance assessment: Needs assistance Sitting-balance support: Bilateral upper extremity supported, No upper extremity supported Sitting balance-Leahy Scale: Fair     Standing balance support: Bilateral upper extremity supported, During functional activity, Reliant on assistive device for balance Standing balance-Leahy Scale: Poor                               Pertinent Vitals/Pain Pain Assessment Pain Assessment: Faces Faces Pain Scale: Hurts little more Pain Location: L foot Pain Descriptors / Indicators: Burning Pain Intervention(s): Limited activity within patient's  tolerance    Home Living Family/patient expects to be discharged to:: Private residence Living Arrangements: Spouse/significant other Available Help at Discharge: Family;Available 24 hours/day;Personal care attendant Type of Home: Apartment Home Access: Level entry       Home Layout: One level Home Equipment: Rollator (4  wheels);Electric scooter;Hospital bed (lift chair) Additional Comments: Has a PCA 4 days a week for about 4 hours a day (13 hour week); wears CPAP at night, no O2 at home    Prior Function Prior Level of Function : Needs assist       Physical Assist : ADLs (physical)   ADLs (physical): Bathing;Dressing Mobility Comments: Rollator for household mobility (short distances); Art gallery manager in community dwellings. ADLs Comments: aide assists with bathing/dressing, provides set up A for grooming tasks, light household chores,  utilizes transportation services. uses BSC does not go into bathroom; enjoys going into the community     Extremity/Trunk Assessment   Upper Extremity Assessment Upper Extremity Assessment: Defer to OT evaluation LUE Deficits / Details: AROM overall WFL; generaly weaker than R but attempting ot use funcitonally; isolated movement patterns LUE Coordination: decreased fine motor    Lower Extremity Assessment Lower Extremity Assessment: Generalized weakness (Extreme Knee hyperextension on R. Weaker on L>R.)    Cervical / Trunk Assessment Cervical / Trunk Assessment: Other exceptions (forward head; increased body habitus)  Communication   Communication Communication: No apparent difficulties  Cognition Arousal: Alert Behavior During Therapy: WFL for tasks assessed/performed Overall Cognitive Status:  (will further assess; most likely close to baseline)                                          General Comments General comments (skin integrity, edema, etc.): At end of session pt noted with increased shakiness in R arm which pt attributed to a seizure. HR up to 229 with tele reading VTach/ VFib. RN called into room and vitals returned to WNL after ~3 minutes. Pt reamined conversive and appeared to be in NAD.    Exercises Other Exercises Other Exercises: smooth pursuits head static   Assessment/Plan    PT Assessment Patient needs continued PT  services  PT Problem List Decreased strength;Decreased range of motion;Decreased activity tolerance;Decreased mobility;Decreased balance;Decreased coordination;Decreased knowledge of use of DME;Decreased safety awareness;Decreased knowledge of precautions;Cardiopulmonary status limiting activity;Impaired sensation       PT Treatment Interventions DME instruction;Gait training;Stair training;Functional mobility training;Therapeutic activities;Therapeutic exercise;Balance training;Neuromuscular re-education;Patient/family education    PT Goals (Current goals can be found in the Care Plan section)  Acute Rehab PT Goals Patient Stated Goal: to get better PT Goal Formulation: With patient Time For Goal Achievement: 07/10/23 Potential to Achieve Goals: Good    Frequency Min 1X/week     Co-evaluation PT/OT/SLP Co-Evaluation/Treatment: Yes Reason for Co-Treatment: To address functional/ADL transfers;For patient/therapist safety PT goals addressed during session: Mobility/safety with mobility;Proper use of DME OT goals addressed during session: ADL's and self-care       AM-PAC PT "6 Clicks" Mobility  Outcome Measure Help needed turning from your back to your side while in a flat bed without using bedrails?: A Little Help needed moving from lying on your back to sitting on the side of a flat bed without using bedrails?: A Little Help needed moving to and from a bed to a chair (including a wheelchair)?: A Lot Help needed standing up from a chair using your arms (  e.g., wheelchair or bedside chair)?: A Lot Help needed to walk in hospital room?: A Lot Help needed climbing 3-5 steps with a railing? : Total 6 Click Score: 13    End of Session Equipment Utilized During Treatment: Gait belt Activity Tolerance: Patient tolerated treatment well;Patient limited by fatigue Patient left: in chair;with call bell/phone within reach;with chair alarm set;with nursing/sitter in room Nurse Communication:  Mobility status;Other (comment) (HR) PT Visit Diagnosis: Other abnormalities of gait and mobility (R26.89)    Time: 4098-1191 PT Time Calculation (min) (ACUTE ONLY): 50 min   Charges:   PT Evaluation $PT Eval Moderate Complexity: 1 Mod   PT General Charges $$ ACUTE PT VISIT: 1 Visit         Shela Nevin, PT, DPT Acute Rehab Services [phone]   Gladys Damme 06/26/2023, 1:50 PM

## 2023-06-26 NOTE — Progress Notes (Signed)
Rx asked to review meds with associated risk for torsade. After going through his extensive list of home meds. These are the ones with some association with torsade:  Aripiprazole Levetiracetam Mirtazapine Trazodone Venlafaxine Omeprazole  Combination of these could have additive risk  [name], PharmD, BCIDP, AAHIVP, CPP Infectious Disease Pharmacist 06/26/2023 1:19 PM

## 2023-06-27 DIAGNOSIS — I639 Cerebral infarction, unspecified: Secondary | ICD-10-CM | POA: Diagnosis not present

## 2023-06-27 LAB — CBC WITH DIFFERENTIAL/PLATELET
Abs Immature Granulocytes: 0.06 10*3/uL (ref 0.00–0.07)
Basophils Absolute: 0.1 10*3/uL (ref 0.0–0.1)
Basophils Relative: 1 %
Eosinophils Absolute: 0.5 10*3/uL (ref 0.0–0.5)
Eosinophils Relative: 5 %
HCT: 38.7 % — ABNORMAL LOW (ref 39.0–52.0)
Hemoglobin: 12.3 g/dL — ABNORMAL LOW (ref 13.0–17.0)
Immature Granulocytes: 1 %
Lymphocytes Relative: 16 %
Lymphs Abs: 1.7 10*3/uL (ref 0.7–4.0)
MCH: 28.7 pg (ref 26.0–34.0)
MCHC: 31.8 g/dL (ref 30.0–36.0)
MCV: 90.2 fL (ref 80.0–100.0)
Monocytes Absolute: 0.8 10*3/uL (ref 0.1–1.0)
Monocytes Relative: 7 %
Neutro Abs: 7.6 10*3/uL (ref 1.7–7.7)
Neutrophils Relative %: 70 %
Platelets: 264 10*3/uL (ref 150–400)
RBC: 4.29 MIL/uL (ref 4.22–5.81)
RDW: 17 % — ABNORMAL HIGH (ref 11.5–15.5)
WBC: 10.7 10*3/uL — ABNORMAL HIGH (ref 4.0–10.5)
nRBC: 0 % (ref 0.0–0.2)

## 2023-06-27 LAB — COMPREHENSIVE METABOLIC PANEL
ALT: 17 U/L (ref 0–44)
AST: 18 U/L (ref 15–41)
Albumin: 3.2 g/dL — ABNORMAL LOW (ref 3.5–5.0)
Alkaline Phosphatase: 42 U/L (ref 38–126)
Anion gap: 8 (ref 5–15)
BUN: 23 mg/dL (ref 8–23)
CO2: 31 mmol/L (ref 22–32)
Calcium: 8.4 mg/dL — ABNORMAL LOW (ref 8.9–10.3)
Chloride: 99 mmol/L (ref 98–111)
Creatinine, Ser: 1.37 mg/dL — ABNORMAL HIGH (ref 0.61–1.24)
GFR, Estimated: 57 mL/min — ABNORMAL LOW (ref >60)
Glucose, Bld: 139 mg/dL — ABNORMAL HIGH (ref 70–99)
Potassium: 3.2 mmol/L — ABNORMAL LOW (ref 3.5–5.1)
Sodium: 138 mmol/L (ref 135–145)
Total Bilirubin: 0.4 mg/dL (ref <1.2)
Total Protein: 6.2 g/dL — ABNORMAL LOW (ref 6.5–8.1)

## 2023-06-27 LAB — GLUCOSE, CAPILLARY
Glucose-Capillary: 136 mg/dL — ABNORMAL HIGH (ref 70–99)
Glucose-Capillary: 138 mg/dL — ABNORMAL HIGH (ref 70–99)
Glucose-Capillary: 107 mg/dL — ABNORMAL HIGH (ref 70–99)
Glucose-Capillary: 94 mg/dL (ref 70–99)

## 2023-06-27 LAB — MAGNESIUM: Magnesium: 2.5 mg/dL — ABNORMAL HIGH (ref 1.7–2.4)

## 2023-06-27 LAB — PHOSPHORUS: Phosphorus: 3.3 mg/dL (ref 2.5–4.6)

## 2023-06-27 MED ORDER — POTASSIUM CHLORIDE CRYS ER 20 MEQ PO TBCR: 20.0000 meq | EXTENDED_RELEASE_TABLET | Freq: Every day | ORAL | Status: DC

## 2023-06-27 MED ORDER — POTASSIUM CHLORIDE CRYS ER 20 MEQ PO TBCR
40.0000 meq | EXTENDED_RELEASE_TABLET | Freq: Once | ORAL | Status: AC
  Administered 2023-06-27: 40 meq via ORAL
  Filled 2023-06-27: qty 4

## 2023-06-27 MED ORDER — POTASSIUM CHLORIDE 10 MEQ/100ML IV SOLN
10.0000 meq | INTRAVENOUS | Status: AC
  Filled 2023-06-27: qty 100

## 2023-06-27 MED ORDER — POTASSIUM CHLORIDE CRYS ER 20 MEQ PO TBCR
20.0000 meq | EXTENDED_RELEASE_TABLET | Freq: Two times a day (BID) | ORAL | Status: DC
  Administered 2023-06-27: 20 meq via ORAL
  Filled 2023-06-27: qty 1

## 2023-06-27 MED ORDER — POTASSIUM CHLORIDE 10 MEQ/100ML IV SOLN
10.0000 meq | INTRAVENOUS | Status: AC
  Administered 2023-06-27 (×3): 10 meq via INTRAVENOUS
  Filled 2023-06-27 (×2): qty 100

## 2023-06-27 NOTE — Plan of Care (Signed)
  Problem: Metabolic: Goal: Ability to maintain appropriate glucose levels will improve Outcome: Not Progressing   Problem: Tissue Perfusion: Goal: Adequacy of tissue perfusion will improve Outcome: Not Progressing   Problem: Skin Integrity: Goal: Risk for impaired skin integrity will decrease Outcome: Not Progressing   Problem: Ischemic Stroke/TIA Tissue Perfusion: Goal: Complications of ischemic stroke/TIA will be minimized Outcome: Not Progressing   Problem: Ischemic Stroke/TIA Tissue Perfusion: Goal: Complications of ischemic stroke/TIA will be minimized Outcome: Not Progressing   Problem: Clinical Measurements: Goal: Will remain free from infection Outcome: Not Progressing

## 2023-06-27 NOTE — Progress Notes (Signed)
Rounding Note    Patient Name: Lawrence [name] Date of Encounter: 06/27/2023  [facility] Cardiologist: Armanda Magic, MD / CHF service  Subjective   Pt denies CP  Breathing is fair   Remains on O2  Does not use at home   Inpatient Medications    Scheduled Meds:  apixaban  5 mg Oral BID   ARIPiprazole  5 mg Oral Daily   busPIRone  10 mg Oral TID   cholecalciferol  2,000 Units Oral Daily   colchicine  0.3 mg Oral BID   cyanocobalamin  1,000 mcg Oral Daily   fenofibrate  160 mg Oral Daily   ferrous sulfate  325 mg Oral Q breakfast   fluticasone furoate-vilanterol  1 puff Inhalation Daily   And   umeclidinium bromide  1 puff Inhalation Daily   icosapent Ethyl  2 g Oral BID   insulin aspart  0-15 Units Subcutaneous TID WC   insulin aspart  0-5 Units Subcutaneous QHS   insulin aspart  12 Units Subcutaneous TID WC   insulin glargine-yfgn  40 Units Subcutaneous Q24H   lacosamide  150 mg Oral BID   levETIRAcetam  500 mg Oral BID   metoprolol tartrate  12.5 mg Oral BID   pantoprazole  40 mg Oral Daily   [START ON 06/28/2023] potassium chloride  20 mEq Oral QHS   potassium chloride  40 mEq Oral Once   pregabalin  150 mg Oral TID   rosuvastatin  20 mg Oral Daily   spironolactone  25 mg Oral Daily   torsemide  120 mg Oral BID   venlafaxine XR  225 mg Oral Q breakfast   Continuous Infusions:  potassium chloride     PRN Meds: acetaminophen **OR** [DISCONTINUED] acetaminophen (TYLENOL) oral liquid 160 mg/5 mL **OR** [DISCONTINUED] acetaminophen, HYDROcodone-acetaminophen, methocarbamol, metoprolol tartrate   Vital Signs    Vitals:   06/27/23 0413 06/27/23 0816 06/27/23 0920 06/27/23 1117  BP: 120/75 110/80 134/86 131/83  Pulse: 75 73  86  Resp: 13 17  16   Temp: 98.4 F (36.9 C) 98.2 F (36.8 C)  98.7 F (37.1 C)  TempSrc: Oral Oral  Oral  SpO2: 92% 93%  92%  Weight:      Height:        Intake/Output Summary (Last 24 hours) at 06/27/2023 1146 Last  data filed at 06/27/2023 0100 Gross per 24 hour  Intake 237 ml  Output 3510 ml  Net -3273 ml      06/24/2023    9:11 PM 06/24/2023    9:00 PM 06/09/2023    1:26 PM  Last 3 Weights  Weight (lbs) 373 lb 0.3 oz 373 lb 0.3 oz 346 lb  Weight (kg) 169.2 kg 169.2 kg 156.945 kg      Telemetry    SR with PVCs    - Personally Reviewed  ECG    No new  - Personally Reviewed  Physical Exam  \ GEN: Morbidly obese 67 yo in no  acute distress.   Neck: Neck is full  Unable to assess JVP  Cardiac: RRR, no murmurs, Respiratory: Clear to auscultation GI: Soft, nontender   MS: Tr to 1+ edema;   Labs    High Sensitivity Troponin:  No results for input(s): "TROPONINIHS" in the last 720 hours.   Chemistry Recent Labs  Lab 06/25/23 0426 06/26/23 0405 06/27/23 0410  NA 140 139 138  K 3.2* 3.2* 3.2*  CL 106 101 99  CO2 26 29  31  GLUCOSE 109* 101* 139*  BUN 30* 27* 23  CREATININE 1.14 1.29* 1.37*  CALCIUM 8.9 8.4* 8.4*  MG  --  2.3 2.5*  PROT 6.4* 6.4* 6.2*  ALBUMIN 3.3* 3.3* 3.2*  AST 15 13* 18  ALT 18 17 17   ALKPHOS 45 41 42  BILITOT 0.4 0.4 0.4  GFRNONAA >60 >60 57*  ANIONGAP 8 9 8     Lipids  Recent Labs  Lab 06/25/23 0426  CHOL 151  TRIG 209*  HDL 30*  LDLCALC 79  CHOLHDL 5.0    Hematology Recent Labs  Lab 06/25/23 0426 06/26/23 0405 06/27/23 0410  WBC 9.9 9.3 10.7*  RBC 4.44 4.49 4.29  HGB 12.9* 13.1 12.3*  HCT 40.6 40.7 38.7*  MCV 91.4 90.6 90.2  MCH 29.1 29.2 28.7  MCHC 31.8 32.2 31.8  RDW 17.2* 17.2* 17.0*  PLT 263 277 264   Thyroid  Recent Labs  Lab 06/24/23 2328  TSH 1.561    BNPNo results for input(s): "BNP", "PROBNP" in the last 168 hours.  DDimer No results for input(s): "DDIMER" in the last 168 hours.   Radiology    ECHOCARDIOGRAM COMPLETE BUBBLE STUDY  Addendum Date: 06/26/2023   Status post 34 mm TAVR Medtronic evolute FX valve (02/07/2023), at the aortic position, grossly well-seated, no perivalvular leak, no valvular  regurgitation, either mild valvular stenosis versus prosthesis patient mismatch (peak velocity 2.7 m/s, peak gradient 29 mmHg, mean gradient 16 mmHg, dimensional index 0.44, EOA 1.11 cm, BSA 2.8 m2, acceleration time 82 ms. Clinical correlation required. [employer] Electronically Amended 06/26/2023, 5:34 PM   Final (Amended)    Result Date: 06/26/2023    ECHOCARDIOGRAM REPORT   Patient Name:   Lawrence [name] Date of Exam: 06/26/2023 Medical Rec #:  [id number]        Height:       74.0 in Accession #:    [id number]       Weight:       373.0 lb Date of Birth:  [birth year]        BSA:          2.833 m Patient Age:    67 years         BP:           112/74 mmHg Patient Gender: M                HR:           79 bpm. Exam Location:  Inpatient Procedure: 2D Echo, Cardiac Doppler, Color Doppler and Saline Contrast Bubble            Study Indications:    Stroke I63.9  History:        Patient has prior history of Echocardiogram examinations, most                 recent 03/10/2023. CHF, CAD, Stroke and COPD, Arrythmias:Atrial                 Fibrillation; Risk Factors:Hypertension, Dyslipidemia and Former                 Smoker.  Sonographer:    Dondra Prader RVT RCS Referring Phys: [phone] DEBBY CROSLEY  Sonographer Comments: Technically difficult study due to poor echo windows, suboptimal apical window and suboptimal subcostal window. Image acquisition challenging due to COPD and Image acquisition challenging due to patient body habitus. IMPRESSIONS  1. Technically difficult study.  2. Left ventricular ejection fraction, by  estimation, is 50 to 55%. The left ventricle has low normal function. Left ventricular endocardial border not optimally defined to evaluate regional wall motion. The left ventricular internal cavity size was dilated. Left ventricular diastolic function could not be evaluated.  3. Right ventricular systolic function is normal. The right ventricular size is normal. Mildly increased right ventricular wall  thickness.  4. There is no evidence of cardiac tamponade.  5. The mitral valve is grossly normal. No evidence of mitral valve regurgitation. No evidence of mitral stenosis.  6. The aortic valve was not well visualized. Aortic valve regurgitation is not visualized. But based on doppler parameter cannot rule out mild (per max velocity and MG) to moderate aortic stenosis (dimensional index).  7. Aortic ascending aorta is structurally normal, with no evidence of dilitation.  8. Agitated saline contrast bubble study was inconclusive. Conclusion(s)/Recommendation(s): No intracardiac source of embolism detected on this transthoracic study. Consider a transesophageal echocardiogram to exclude cardiac source of embolism if clinically indicated. FINDINGS  Left Ventricle: Left ventricular ejection fraction, by estimation, is 50 to 55%. The left ventricle has low normal function. Left ventricular endocardial border not optimally defined to evaluate regional wall motion. The left ventricular internal cavity  size was dilated. There is borderline left ventricular hypertrophy. Left ventricular diastolic function could not be evaluated. Right Ventricle: The right ventricular size is normal. Mildly increased right ventricular wall thickness. Right ventricular systolic function is normal. Left Atrium: Left atrial size was normal in size. Right Atrium: Right atrial size was normal in size. Pericardium: Trivial pericardial effusion is present. There is no evidence of cardiac tamponade. Mitral Valve: The mitral valve is grossly normal. No evidence of mitral valve regurgitation. No evidence of mitral valve stenosis. Tricuspid Valve: The tricuspid valve is grossly normal. Tricuspid valve regurgitation is not demonstrated. No evidence of tricuspid stenosis. Aortic Valve: The aortic valve was not well visualized. Aortic valve regurgitation is not visualized. But based on doppler parameter cannot rule out mild (per max velocity and MG) to  moderate aortic stenosis (dimensional index). Aortic valve mean gradient measures 15.7 mmHg. Aortic valve peak gradient measures 29.4 mmHg. Aortic valve area, by VTI measures 1.11 cm. Pulmonic Valve: The pulmonic valve was not well visualized. Pulmonic valve regurgitation is not visualized. No evidence of pulmonic stenosis. Aorta: Aortic root could not be assessed and ascending aorta is structurally normal, with no evidence of dilitation. IAS/Shunts: The interatrial septum was not assessed. Agitated saline contrast was given intravenously to evaluate for intracardiac shunting. Agitated saline contrast bubble study was inconclusive.  LEFT VENTRICLE PLAX 2D LVIDd:         6.30 cm LVIDs:         4.60 cm LV PW:         1.30 cm LV IVS:        1.10 cm LVOT diam:     1.80 cm LV SV:         58 LV SV Index:   21 LVOT Area:     2.54 cm  RIGHT VENTRICLE             IVC RV S prime:     16.20 cm/s  IVC diam: 3.30 cm LEFT ATRIUM             Index        RIGHT ATRIUM           Index LA diam:        4.80 cm 1.69 cm/m  RA Area:     17.10 cm LA Vol (A2C):   62.1 ml 21.92 ml/m  RA Volume:   43.60 ml  15.39 ml/m LA Vol (A4C):   77.2 ml 27.25 ml/m LA Biplane Vol: 70.8 ml 24.99 ml/m  AORTIC VALVE                     PULMONIC VALVE AV Area (Vmax):    1.01 cm      PV Vmax:       1.23 m/s AV Area (Vmean):   1.09 cm      PV Peak grad:  6.1 mmHg AV Area (VTI):     1.11 cm AV Vmax:           271.33 cm/s AV Vmean:          181.000 cm/s AV VTI:            0.524 m AV Peak Grad:      29.4 mmHg AV Mean Grad:      15.7 mmHg LVOT Vmax:         107.42 cm/s LVOT Vmean:        77.260 cm/s LVOT VTI:          0.229 m LVOT/AV VTI ratio: 0.44  AORTA Ao Asc diam: 3.80 cm MITRAL VALVE MV Area (PHT): 4.39 cm    SHUNTS MV Decel Time: 173 msec    Systemic VTI:  0.23 m MV E velocity: 85.60 cm/s  Systemic Diam: 1.80 cm MV A velocity: 78.00 cm/s MV E/A ratio:  1.10 [employer] Electronically signed by Tessa Lerner Signature Date/Time:  06/26/2023/11:37:34 AM    Final (Amended)     Cardiac Studies  1. Technically difficult study.   2. Left ventricular ejection fraction, by estimation, is 50 to 55%. The  left ventricle has low normal function. Left ventricular endocardial  border not optimally defined to evaluate regional wall motion. The left  ventricular internal cavity size was  dilated. Left ventricular diastolic function could not be evaluated.   3. Right ventricular systolic function is normal. The right ventricular  size is normal. Mildly increased right ventricular wall thickness.   4. There is no evidence of cardiac tamponade.   5. The mitral valve is grossly normal. No evidence of mitral valve  regurgitation. No evidence of mitral stenosis.   6. The aortic valve was not well visualized. Aortic valve regurgitation  is not visualized. But based on doppler parameter cannot rule out mild  (per max velocity and MG) to moderate aortic stenosis (dimensional index).   7. Aortic ascending aorta is structurally normal, with no evidence of  dilitation.   8. Agitated saline contrast bubble study was inconclusive.   Conclusion(s)/Recommendation(s): No intracardiac source of embolism  detected on this transthoracic study. Consider a transesophageal  echocardiogram to exclude cardiac source of embolism if clinically  indicated.     Patient Profile      SAUEL ROBNETT is a 67 y.o. male with a hx of paroxysmal atrial fibrillation, aortic stenosis status post TAVR, chronic HFpEF, PVCs, seizures, Hypertension, hyperlipidemia, type 2 diabetes, OSA uses BiPAP, who is being seen 06/26/2023 for the evaluation of wide-complex tachycardia at the request of Dr. Thedore Mins.   Assessment & Plan    1  Rhythm  Pt with SR with PVCs No significant arrhythmias B BLocker added yesterday     Can increase to 25 mg bid  Follow  After D/C would set up for outpt  Zio patch   2  HFpEF    Volume status is difficult given pt's body habitus Pt   is on O2  now   He had not been prior to hospitalization   Follow with current dose of torsemide   With bump in Cr, reluctant to push further    Reassess in am    For questions or updates, please contact [facility] Please consult [email or web address] for contact info under        Signed, Dietrich Pates, MD  06/27/2023, 11:46 AM

## 2023-06-27 NOTE — PMR Pre-admission (Signed)
 PMR Admission Coordinator Pre-Admission Assessment  Patient: Lawrence [name] is an 67 y.o., male MRN: [id number] DOB: 1956-04-13 Height: 6\' 2"  (188 cm) Weight: (!) 169.2 kg  Insurance Information HMO:     PPO:      PCP:      IPA:      80/20:      OTHER:  PRIMARY: [facility]      Policy#: [id number]      Subscriber: pt CM Name: Hassie Lint      Phone#: [phone]     Fax#: [phone] Pre-Cert#: tbd on admit      Employer:  Benefits:  Phone #: [phone]     Name:  Eff. Date: 12/31/2017     Deduct: $0      Out of Pocket Max: $0      Life Max: n/a CIR: 100%      SNF:  Outpatient:      Co-Pay:  Home Health:       Co-Pay:  DME:      Co-Pay:  Providers:  SECONDARY: [employer]      Policy#: [id number]     Phone#: [phone]  Financial Counselor:       Phone#:   The "Data Collection Information Summary" for patients in Inpatient Rehabilitation Facilities with attached "Privacy Act Statement-Health Care Records" was provided and verbally reviewed with: Patient and Family  Emergency Contact Information Contact Information     Name Relation Home Work Mobile   Lawrence [name] Significant other [phone]     Lawrence [name] Sister [phone]  [phone]      Other Contacts   None on File     Current Medical History  Patient Admitting Diagnosis: CVA   History of Present Illness: Pt is a 66 y/o male with PMH of afib (on anticoagulation), DM, seizures, CAD, COPD, OSA (on home cpap), aortic stenosis (s/p TAVR), and CVA with residual L sided weakness who admitted to [facility] on 06/24/23 with c/o difficulty arousing, slurred speech, and L sided facial droop.  In ED BP soft at 113/51, sp02 94%, potassium 5.3, creatinine 1.30, hgb A1C 6.4.  NIHSS 9.  Neurology consulted.  Imaging revealed small L inferior cerebellar infarct felt to be embolic in the setting of AFib despite being on anticoagulation.  Echo with bubble study inconclusive.  Recommendations to continue eliquis  at  discharge. Therapy evaluations completed and pt was recommended for CIR.   Complete NIHSS TOTAL: 3  Patient's medical record from Lawrence [name] has been reviewed by the rehabilitation admission coordinator and physician.  Past Medical History  Past Medical History:  Diagnosis Date   Acquired dilation of ascending aorta and aortic root (HCC)    40mm by echo 01/2021   Adenomatous colon polyp 2007   Anemia    Anxiety    Asthma    BPH without obstruction/lower urinary tract symptoms 02/22/2017   Chronic diastolic (congestive) heart failure (HCC)    Chronic venous stasis 03/07/2019   COPD (chronic obstructive pulmonary disease) (HCC)    Coronary artery calcification seen on CAT scan    Depression    Diabetic neuropathy (HCC) 09/11/2019   History of colon polyps 08/24/2018   Hypertension    Morbid obesity (HCC)    OSA (obstructive sleep apnea)    Pain due to onychomycosis of toenails of both feet 09/11/2019   Peripheral neuropathy 02/22/2017   Primary osteoarthritis, left shoulder 03/05/2017   PTSD (post-traumatic stress disorder)    Pure hypercholesterolemia    QT prolongation 03/07/2019  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 Lorie Rook and Dr. Sherene Dilling   Seizures [facility])    Severe aortic stenosis    Sinus tachycardia 03/07/2019   Sleep apnea    CPAP   Type 2 diabetes mellitus with vascular disease (HCC) 09/11/2019    Has the patient had major surgery during 100 days prior to admission? No  Family History   family history includes CAD in his maternal grandfather; Diabetes in an other family member.  Current Medications  Current Facility-Administered Medications:    acetaminophen  (TYLENOL ) tablet 650 mg, 650 mg, Oral, Q4H PRN, 650 mg at 06/28/23 0239 **OR** [DISCONTINUED] acetaminophen  (TYLENOL ) 160 MG/5ML solution 650 mg, 650 mg, Per Tube, Q4H PRN **OR** [DISCONTINUED] acetaminophen  (TYLENOL ) suppository 650 mg, 650  mg, Rectal, Q4H PRN, Crosley, Debby, MD   apixaban  (ELIQUIS ) tablet 5 mg, 5 mg, Oral, BID, Crosley, Debby, MD, 5 mg at 06/28/23 1009   ARIPiprazole  (ABILIFY ) tablet 5 mg, 5 mg, Oral, Daily, Crosley, Debby, MD, 5 mg at 06/28/23 1009   busPIRone  (BUSPAR ) tablet 10 mg, 10 mg, Oral, TID, Crosley, Debby, MD, 10 mg at 06/28/23 1010   cholecalciferol  (VITAMIN D3) 25 MCG (1000 UNIT) tablet 2,000 Units, 2,000 Units, Oral, Daily, Singh, Prashant K, MD, 2,000 Units at 06/28/23 1010   colchicine  tablet 0.3 mg, 0.3 mg, Oral, BID, Singh, Prashant K, MD, 0.3 mg at 06/28/23 1012   cyanocobalamin  (VITAMIN B12) tablet 1,000 mcg, 1,000 mcg, Oral, Daily, Singh, Prashant K, MD, 1,000 mcg at 06/28/23 1009   fenofibrate  tablet 160 mg, 160 mg, Oral, Daily, Crosley, Debby, MD, 160 mg at 06/28/23 1010   ferrous sulfate  tablet 325 mg, 325 mg, Oral, Q breakfast, Singh, Prashant K, MD, 325 mg at 06/28/23 1012   fluticasone  furoate-vilanterol (BREO ELLIPTA ) 200-25 MCG/ACT 1 puff, 1 puff, Inhalation, Daily, 1 puff at 06/26/23 0813 **AND** umeclidinium bromide  (INCRUSE ELLIPTA ) 62.5 MCG/ACT 1 puff, 1 puff, Inhalation, Daily, Crosley, Debby, MD, 1 puff at 06/28/23 0842   HYDROcodone -acetaminophen  (NORCO/VICODIN) 5-325 MG per tablet 1 tablet, 1 tablet, Oral, Q6H PRN, Dezii, Alexandra, DO, 1 tablet at 06/28/23 0518   icosapent  Ethyl (VASCEPA ) 1 g capsule 2 g, 2 g, Oral, BID, Crosley, Debby, MD, 2 g at 06/28/23 1012   insulin  aspart (novoLOG ) injection 0-15 Units, 0-15 Units, Subcutaneous, TID WC, Crosley, Debby, MD, 2 Units at 06/28/23 1007   insulin  aspart (novoLOG ) injection 0-5 Units, 0-5 Units, Subcutaneous, QHS, Crosley, Debby, MD   insulin  aspart (novoLOG ) injection 12 Units, 12 Units, Subcutaneous, TID WC, Crosley, Debby, MD, 12 Units at 06/28/23 1005   insulin  glargine-yfgn (SEMGLEE ) injection 40 Units, 40 Units, Subcutaneous, Q24H, Crosley, Debby, MD, 40 Units at 06/28/23 1005   lacosamide  (VIMPAT ) tablet 150 mg, 150 mg,  Oral, BID, Crosley, Debby, MD, 150 mg at 06/28/23 1010   levETIRAcetam  (KEPPRA ) tablet 500 mg, 500 mg, Oral, BID, Crosley, Debby, MD, 500 mg at 06/28/23 1009   methocarbamol  (ROBAXIN ) tablet 500 mg, 500 mg, Oral, Q8H PRN, Singh, Prashant K, MD, 500 mg at 06/28/23 1040   metoprolol  tartrate (LOPRESSOR ) injection 5 mg, 5 mg, Intravenous, Q4H PRN, Crosley, Debby, MD   metoprolol  tartrate (LOPRESSOR ) tablet 12.5 mg, 12.5 mg, Oral, BID, Singh, Prashant K, MD, 12.5 mg at 06/28/23 1012   pantoprazole  (PROTONIX ) EC tablet 40 mg, 40 mg, Oral, Daily, Singh, Prashant K, MD, 40 mg at 06/28/23 1009   potassium chloride  SA (KLOR-CON  M) CR tablet 40 mEq, 40  mEq, Oral, Daily, Singh, Prashant K, MD, 40 mEq at 06/28/23 1010   pregabalin  (LYRICA ) capsule 150 mg, 150 mg, Oral, TID, Crosley, Debby, MD, 150 mg at 06/28/23 1010   rosuvastatin  (CRESTOR ) tablet 20 mg, 20 mg, Oral, Daily, Singh, Prashant K, MD, 20 mg at 06/28/23 1009   spironolactone  (ALDACTONE ) tablet 25 mg, 25 mg, Oral, Daily, Singh, Prashant K, MD, 25 mg at 06/28/23 1009   torsemide  (DEMADEX ) tablet 120 mg, 120 mg, Oral, BID, Crosley, Debby, MD, 120 mg at 06/28/23 1009   venlafaxine  XR (EFFEXOR -XR) 24 hr capsule 225 mg, 225 mg, Oral, Q breakfast, Crosley, Debby, MD, 225 mg at 06/28/23 1010  Patients Current Diet:  Diet Order             Diet heart healthy/carb modified Room service appropriate? Yes; Fluid consistency: Thin  Diet effective now                   Precautions / Restrictions Precautions Precautions: Fall Precaution Comments: watch HR adn O2 Restrictions Weight Bearing Restrictions: No   Has the patient had 2 or more falls or a fall with injury in the past year? No  Prior Activity Level Community (5-7x/wk): rollator for household distances, scooter for community distances, uses acces [place] for transportation, assist for ADLs (spongebathing/dressing)  Prior Functional Level Self Care: Did the patient need help bathing,  dressing, using the toilet or eating? Needed some help  Indoor Mobility: Did the patient need assistance with walking from room to room (with or without device)? Independent  Stairs: Did the patient need assistance with internal or external stairs (with or without device)? Dependent  Functional Cognition: Did the patient need help planning regular tasks such as shopping or remembering to take medications? Independent  Patient Information Are you of Hispanic, Latino/a,or Spanish origin?: A. No, not of Hispanic, Latino/a, or Spanish origin What is your race?: A. White Do you need or want an interpreter to communicate with a doctor or health care staff?: 0. No  Patient's Response To:  Health Literacy and Transportation Is the patient able to respond to health literacy and transportation needs?: Yes Health Literacy - How often do you need to have someone help you when you read instructions, pamphlets, or other written material from your doctor or pharmacy?: Never In the past 12 months, has lack of transportation kept you from medical appointments or from getting medications?: No In the past 12 months, has lack of transportation kept you from meetings, work, or from getting things needed for daily living?: No  Journalist, newspaper / Equipment Home Equipment: Occupational hygienist (4 wheels), Electric scooter, Hospital bed (lift chair)  Prior Device Use: Indicate devices/aids used by the patient prior to current illness, exacerbation or injury? Motorized wheelchair or scooter and Government social research officer  Arousal/Alertness: Awake/alert Overall Cognitive Status: Impaired/Different from baseline Orientation Level: Oriented X4 Attention: Sustained Sustained Attention: Impaired Sustained Attention Impairment: Verbal basic Memory: Impaired Memory Impairment: Retrieval deficit Awareness: Appears intact Problem Solving: Impaired Problem Solving Impairment: Verbal basic    Extremity  Assessment (includes Sensation/Coordination)  Upper Extremity Assessment: Defer to OT evaluation LUE Deficits / Details: AROM overall WFL; generaly weaker than R but attempting ot use funcitonally; isolated movement patterns LUE Coordination: decreased fine motor  Lower Extremity Assessment: Generalized weakness (Extreme Knee hyperextension on R. Weaker on L>R.)    ADLs  Overall ADL's : Needs assistance/impaired Eating/Feeding: Modified independent Grooming: Set up, Sitting Upper Body Bathing: Minimal assistance, Sitting  Lower Body Bathing: Maximal assistance, Sit to/from stand Upper Body Dressing : Moderate assistance Lower Body Dressing: Maximal assistance, Sit to/from stand Toilet Transfer: Moderate assistance, +2 for physical assistance, Stand-pivot, Rolling walker (2 wheels) Toileting- Clothing Manipulation and Hygiene: Maximal assistance Toileting - Clothing Manipulation Details (indicate cue type and reason): incontinent of urine at times; uses urinal frequently Functional mobility during ADLs: Moderate assistance, +2 for safety/equipment (progressed to min A +2 for safety)    Mobility  Overal bed mobility: Needs Assistance Bed Mobility: Supine to Sit Supine to sit: Supervision, HOB elevated, Used rails General bed mobility comments: increased time    Transfers  Overall transfer level: Needs assistance Equipment used: Rolling walker (2 wheels) Transfers: Sit to/from Stand, Bed to chair/wheelchair/BSC Sit to Stand: Mod assist, Min assist Bed to/from chair/wheelchair/BSC transfer type:: Step pivot Step pivot transfers: Min assist General transfer comment: STS from elevated EOB with mod A progressing to min A from the recliner. Min A for stability and R knee blocking to step to the recliner    Ambulation / Gait / Stairs / Psychologist, prison and probation services  Ambulation/Gait Ambulation/Gait assistance: Min assist, +2 safety/equipment Gait Distance (Feet): 6 Feet Assistive device: Rolling  walker (2 wheels) Gait Pattern/deviations: Knee hyperextension - right, Decreased stride length, Step-through pattern, Trunk flexed General Gait Details: deferred due to increased fatigue and need for chair follow for safety Gait velocity: decreased    Posture / Balance Dynamic Sitting Balance Sitting balance - Comments: sitting EOB Balance Overall balance assessment: Needs assistance Sitting-balance support: Bilateral upper extremity supported, No upper extremity supported Sitting balance-Leahy Scale: Fair Sitting balance - Comments: sitting EOB Standing balance support: Bilateral upper extremity supported, During functional activity, Reliant on assistive device for balance Standing balance-Leahy Scale: Poor Standing balance comment: reliant on RW support    Special needs/care consideration CPAP, Oxygen 3L acutely, and Diabetic management yes   Previous Home Environment (from acute therapy documentation) Living Arrangements: Spouse/significant other  Lives With: Significant other Available Help at Discharge: Family, Available 24 hours/day, Personal care attendant Type of Home: Apartment Home Layout: One level Home Access: Level entry Bathroom Shower/Tub: Engineer, manufacturing systems: Standard Bathroom Accessibility: Yes How Accessible: Accessible via walker Home Care Services: Yes Type of Home Care Services: Homehealth aide, Home PT Home Care Agency (if known): [facility] Additional Comments: Has a PCA 4 days a week for about 4 hours a day (13 hour week); wears CPAP at night, no O2 at home  Discharge Living Setting Plans for Discharge Living Setting: Patient's home, Lives with (comment) (significant other, Bobbe Burner) Type of Home at Discharge: Apartment Discharge Home Layout: One level Discharge Home Access: Level entry Discharge Bathroom Shower/Tub: Tub/shower unit Discharge Bathroom Toilet: Standard Discharge Bathroom Accessibility: Yes How  Accessible: Accessible via walker Does the patient have any problems obtaining your medications?: No  Social/Family/Support Systems Patient Roles: Partner Anticipated Caregiver: Bobbe Burner and PCA Anticipated Caregiver's Contact Information: [name] [phone] Ability/Limitations of Caregiver: supervision mobility, min/mod assist ADls Caregiver Availability: 24/7 Discharge Plan Discussed with Primary Caregiver: Yes Is Caregiver In Agreement with Plan?: Yes Does Caregiver/Family have Issues with Lodging/Transportation while Pt is in Rehab?: No  Goals Patient/Family Goal for Rehab: PT/OT supervision to min assist, SLP supervision to mod I Expected length of stay: 16-20 days Additional Information: Discharge plan: return to pt's apart with S/O and PCA providing assit for ADLs as per baseline, supervision goals for mobility Pt/Family Agrees to Admission and willing to participate: Yes Program Orientation Provided &  Reviewed with Pt/Caregiver Including Roles  & Responsibilities: Yes  Decrease burden of Care through IP rehab admission: no  Possible need for SNF placement upon discharge: Not anticipated.  Plan to discharge back home with s/o and PCA providing assist for ADls, supervision level for mobility with rollator.   Patient Condition: I have reviewed medical records from [facility], spoken with  [facility] team , and patient and spouse. I met with patient at the bedside and discussed via phone for inpatient rehabilitation assessment.  Patient will benefit from ongoing PT, OT, and SLP, can actively participate in 3 hours of therapy a day 5 days of the week, and can make measurable gains during the admission.  Patient will also benefit from the coordinated team approach during an Inpatient Acute Rehabilitation admission.  The patient will receive intensive therapy as well as Rehabilitation physician, nursing, social worker, and care management interventions.  Due to bladder management, bowel  management, safety, skin/wound care, disease management, medication administration, pain management, and patient education the patient requires 24 hour a day rehabilitation nursing.  The patient is currently mod assist to max assist with mobility and basic ADLs.  Discharge setting and therapy post discharge at home with home health is anticipated.  Patient has agreed to participate in the Acute Inpatient Rehabilitation Program and will admit today.  Preadmission Screen Completed By:  Mickey Alar, PT, DPT 06/28/2023 11:11 AM ______________________________________________________________________   Discussed status with Dr. Rayleen Cal on 06/28/23  at 11:11 AM  and received approval for admission today.  Admission Coordinator:  Caitlin E Warren, PT, DPT time 11:11 AM Alanna Hu 06/28/23    Assessment/Plan: Diagnosis: CVA Does the need for close, 24 hr/day Medical supervision in concert with the patient's rehab needs make it unreasonable for this patient to be served in a less intensive setting? Yes Co-Morbidities requiring supervision/potential complications: Respiratory failure, Afib, Vtach vs torsades, OSA, LE edema, Metabolic encephalopathy, CHF, CKD 3A, HTN, Gout, Morbid obesity, Dm2 Due to bladder management, bowel management, safety, skin/wound care, disease management, medication administration, pain management, and patient education, does the patient require 24 hr/day rehab nursing? Yes Does the patient require coordinated care of a physician, rehab nurse, PT, OT, and SLP to address physical and functional deficits in the context of the above medical diagnosis(es)? Yes Addressing deficits in the following areas: balance, endurance, locomotion, strength, transferring, bowel/bladder control, bathing, dressing, feeding, grooming, toileting, cognition, speech, language, swallowing, and psychosocial support Can the patient actively participate in an intensive therapy program of at least 3 hrs of  therapy 5 days a week? Yes The potential for patient to make measurable gains while on inpatient rehab is excellent Anticipated functional outcomes upon discharge from inpatient rehab: supervision and min assist PT, supervision and min assist OT, modified independent and supervision SLP Estimated rehab length of stay to reach the above functional goals is: 16-20 Anticipated discharge destination: Home 10. Overall Rehab/Functional Prognosis: good   MD Signature: Lylia Sand

## 2023-06-27 NOTE — Progress Notes (Signed)
Physical Therapy Treatment Patient Details Name: Lawrence [name] MRN: [id number] DOB: November 10, 1955 Today's Date: 06/27/2023   History of Present Illness 67 y.o. male with atrial fibrillation on anticoagulation, prior CVA with residual left-sided weakness, type 2 diabetes with neuropathy, seizure disorder on antiepileptics, CAD, COPD, OSA, history of severe aortic stenosis status post TAVR who presented to the ER from home on 11/16 via EMS as a code stroke. MRI (+) acute ischemic nonhemorrhagic left cerebellar infarct.    PT Comments  Pt received in supine and agreeable to session. Pt demonstrates good progress towards functional mobility goals, however remains limited by impaired activity tolerance and weakness. Pt able to progress to min A to stand and step pivot to recliner, however requires blocking of R knee due to hyperextension. Pt reports this hyperextension is chronic and he has a brace at home that his wife can bring. Pt able to tolerate additional standing trial, but reports mild dizziness and increased fatigue requiring seated rest break. Pt continues to benefit from PT services to progress toward functional mobility goals.     If plan is discharge home, recommend the following: A lot of help with walking and/or transfers;A little help with bathing/dressing/bathroom;Assistance with cooking/housework;Assist for transportation;Help with stairs or ramp for entrance   Can travel by private vehicle        Equipment Recommendations  Other (comment) (per accepting facility)    Recommendations for Other Services       Precautions / Restrictions Precautions Precautions: Fall Precaution Comments: watch HR adn O2 Restrictions Weight Bearing Restrictions: No     Mobility  Bed Mobility Overal bed mobility: Needs Assistance Bed Mobility: Supine to Sit     Supine to sit: Supervision, HOB elevated, Used rails     General bed mobility comments: increased time    Transfers Overall  transfer level: Needs assistance Equipment used: Rolling walker (2 wheels) Transfers: Sit to/from Stand, Bed to chair/wheelchair/BSC Sit to Stand: Mod assist, Min assist   Step pivot transfers: Min assist       General transfer comment: STS from elevated EOB with mod A progressing to min A from the recliner. Min A for stability and R knee blocking to step to the recliner    Ambulation/Gait               General Gait Details: deferred due to increased fatigue and need for chair follow for safety   Modified Rankin (Stroke Patients Only) Modified Rankin (Stroke Patients Only) Pre-Morbid Rankin Score: Moderate disability Modified Rankin: Moderately severe disability     Balance Overall balance assessment: Needs assistance Sitting-balance support: Bilateral upper extremity supported, No upper extremity supported Sitting balance-Leahy Scale: Fair Sitting balance - Comments: sitting EOB   Standing balance support: Bilateral upper extremity supported, During functional activity, Reliant on assistive device for balance Standing balance-Leahy Scale: Poor Standing balance comment: reliant on RW support                            Cognition Arousal: Alert Behavior During Therapy: WFL for tasks assessed/performed Overall Cognitive Status: Impaired/Different from baseline                                          Exercises Total Joint Exercises Towel Squeeze: AROM, Seated, Both, 5 reps General Exercises - Lower Extremity Long Arc Quad: AROM, Seated,  Right, 5 reps    General Comments        Pertinent Vitals/Pain Pain Assessment Pain Assessment: No/denies pain     PT Goals (current goals can now be found in the care plan section) Acute Rehab PT Goals Patient Stated Goal: to get better PT Goal Formulation: With patient Time For Goal Achievement: 07/10/23 Progress towards PT goals: Progressing toward goals    Frequency    Min  1X/week       AM-PAC PT "6 Clicks" Mobility   Outcome Measure  Help needed turning from your back to your side while in a flat bed without using bedrails?: A Little Help needed moving from lying on your back to sitting on the side of a flat bed without using bedrails?: A Little Help needed moving to and from a bed to a chair (including a wheelchair)?: A Little Help needed standing up from a chair using your arms (e.g., wheelchair or bedside chair)?: A Little Help needed to walk in hospital room?: Total (>67ft) Help needed climbing 3-5 steps with a railing? : Total 6 Click Score: 14    End of Session Equipment Utilized During Treatment: Gait belt Activity Tolerance: Patient tolerated treatment well;Patient limited by fatigue Patient left: in chair;with call bell/phone within reach;with chair alarm set (with SLP present) Nurse Communication: Mobility status PT Visit Diagnosis: Other abnormalities of gait and mobility (R26.89)     Time: 4888-9169 PT Time Calculation (min) (ACUTE ONLY): 24 min  Charges:    $Therapeutic Exercise: 8-22 mins $Therapeutic Activity: 8-22 mins PT General Charges $$ ACUTE PT VISIT: 1 Visit                     Johny Shock, PTA Acute Rehabilitation Services Secure Chat Preferred  [phone]    Johny Shock 06/27/2023, 4:33 PM

## 2023-06-27 NOTE — Progress Notes (Addendum)
Inpatient Rehab Coordinator Note:  I met with patient at bedside to discuss CIR recommendations and goals/expectations of CIR stay.  We reviewed 3 hrs/day of therapy, physician follow up, and average length of stay 2 weeks (dependent upon progress) with goals of supervision to min assist.  Pt reports he has a s/o at home who provides some assist with ADLs at baseline.  We reviewed need for prior auth and I can start that process today.  I updated his s/o, [name], on the phone.  We will follow.   Estill Dooms, PT, DPT Admissions Coordinator [phone] 06/27/23  12:13 PM

## 2023-06-27 NOTE — Progress Notes (Signed)
PROGRESS NOTE                                                                                                                                                                                                             Patient Demographics:    Lawrence [name], is a 67 y.o. male, DOB - 09-Jul-1956, ZOX:[id number]  Outpatient Primary MD for the patient is [name], PA-C    LOS - 2  Admit date - 06/24/2023    Chief Complaint  Patient presents with   Weakness       Brief Narrative (HPI from H&P)   67 y.o. male with atrial fibrillation on anticoagulation, prior CVA with residual left-sided weakness, type 2 diabetes with neuropathy, seizure disorder on antiepileptics, CAD, COPD, OSA, history of severe aortic stenosis status post TAVR who presented to the ER on 11/16 via EMS as a code stroke.  Reportedly patient's family found him unresponsive in his recliner.  On arrival to the ER patient was hemodynamically stable with mild AKI.  CT head/CTA head and neck were largely within normal limits.  Neurology was consulted.  Brain MRI revealed 5 mm acute ischemic nonhemorrhagic left cerebellar infarct.  Patient was admitted to [facility] on 11/17 and diagnosed with another acute stroke.   Subjective:   Patient in bed, appears comfortable, denies any headache, no fever, no chest pain or pressure, no shortness of breath , no abdominal pain. No focal weakness.   Assessment  & Plan :   Acute CVA MRI with 5 mm acute ischemic left cerebellar infarct -  second such stroke in the last few months, MRI noted, CTA noted, nonacute echo, neurology on board.  Continue Eliquis, statin dose adjusted for better LDL control, A1c stable, PT OT, may require SNF.  Discussed with stroke team Dr. Pearlean Brownie on 06/26/2023, the present stroke is silent as it does not correlate with his deficit of left lower extremity weakness, this deficit is chronic present on admission  from previous stroke.  History of CVA At baseline patient does have some left lower extremity weakness though he was previously ambulatory with a rollator,  Home meds as above   Acute respiratory failure - Patient not on home O2,  Chest x-ray without acute airspace disease, Suspect current etiology is due to obesity hypoventilation syndrome with acute deconditioning given current CVA, Continue  supplemental O2, wean as tolerated in daytime, nighttime CPAP, outpatient pulmonary follow-up and sleep study.   Chr. Atrial fibrillation.  [place] vas 2 score of greater than 5.  Continue Eliquis, rate in good control, blood pressure too low for beta-blocker right now.    Questionable V. tach versus torsades.  Seen by cardiology likely artifact, monitor on telemetry, patient was asymptomatic, EF is about 50%, monitor electrolytes.  OSA  - CPAP at night   Chronic bilateral lower extremity venous stasis ulcers.  Wound care, resume diuretics .  Acute metabolic encephalopathy  - Present on arrival, now resolved to baseline   Chronic diastolic heart failure EF 60% on recent echocardiogram- Home med spironolactone, torsemide on hold given low BP and desires for permissive hypertension as above,  Continue Eliquis, statin, fenofibrate, on home diuretics.   COPD  - Continue Trelegy, Flonase, Zyrtec   CKD 3A.  Baseline creatinine anywhere between 1 and 1.4, monitor on diuretics.    Hypokalemia - replaced  Depression  - Continue all home meds   Hypertension  - Permissive hypertension , gradual addition on B Blocker   Gout flare  - Right great toe gout flare, colchicine, hold allopurinol as he has acute flare, as needed pain medications.   BMI 47  - Outpatient follow up for lifestyle modification and risk factor management  Type 2 diabetes with peripheral neuropathy - Sliding scale insulin, basal bolus as tolerated  Lab Results  Component Value Date   HGBA1C 6.5 (H) 06/26/2023   CBG (last 3)  Recent  Labs    06/26/23 1604 06/26/23 2045 06/27/23 0815  GLUCAP 130* 104* 136*            Condition - Extremely Guarded  Family Communication  :  None present  Code Status :  Full  Consults  :  Neurology  PUD Prophylaxis :  PPI   Procedures  :     TTE -  1. Technically difficult study.  2. Left ventricular ejection fraction, by estimation, is 50 to 55%. The left ventricle has low normal function. Left ventricular endocardial border not optimally defined to evaluate regional wall motion. The left ventricular internal cavity size was dilated. Left ventricular diastolic function could not be evaluated.  3. Right ventricular systolic function is normal. The right ventricular size is normal. Mildly increased right ventricular wall thickness.  4. There is no evidence of cardiac tamponade.  5. The mitral valve is grossly normal. No evidence of mitral valve regurgitation. No evidence of mitral stenosis.  6. The aortic valve was not well visualized. Aortic valve regurgitation is not visualized. But based on doppler parameter cannot rule out mild (per max velocity and MG) to moderate aortic stenosis (dimensional index).  7. Aortic ascending aorta is structurally normal, with no evidence of dilitation.  8. Agitated saline contrast bubble study was inconclusive.  MRI - 1.  5 mm acute ischemic nonhemorrhagic left cerebellar infarct. 2. Underlying mild age-related cerebral volume loss with mild chronic microvascular ischemic disease.  CTA - 1. No emergent large vessel occlusion. 2. Atherosclerotic changes at the carotid bifurcations bilaterally without significant stenosis. 3. Mild tortuosity of the cervical internal carotid arteries bilaterally likely reflects chronic hypertension. 4. Degenerative changes in the cervical spine. 5.  Aortic Atherosclerosis (ICD10-I70.0).      Disposition Plan  :    Status is: Inpatient  DVT Prophylaxis  :     apixaban (ELIQUIS) tablet 5 mg     Lab Results   Component  Value Date   PLT 264 06/27/2023    Diet :  Diet Order             Diet heart healthy/carb modified Room service appropriate? Yes; Fluid consistency: Thin  Diet effective now                    Inpatient Medications  Scheduled Meds:  apixaban  5 mg Oral BID   ARIPiprazole  5 mg Oral Daily   busPIRone  10 mg Oral TID   cholecalciferol  2,000 Units Oral Daily   colchicine  0.3 mg Oral BID   cyanocobalamin  1,000 mcg Oral Daily   fenofibrate  160 mg Oral Daily   ferrous sulfate  325 mg Oral Q breakfast   fluticasone furoate-vilanterol  1 puff Inhalation Daily   And   umeclidinium bromide  1 puff Inhalation Daily   icosapent Ethyl  2 g Oral BID   insulin aspart  0-15 Units Subcutaneous TID WC   insulin aspart  0-5 Units Subcutaneous QHS   insulin aspart  12 Units Subcutaneous TID WC   insulin glargine-yfgn  40 Units Subcutaneous Q24H   lacosamide  150 mg Oral BID   levETIRAcetam  500 mg Oral BID   metoprolol tartrate  12.5 mg Oral BID   pantoprazole  40 mg Oral Daily   [START ON 06/28/2023] potassium chloride  20 mEq Oral QHS   potassium chloride  40 mEq Oral Once   pregabalin  150 mg Oral TID   rosuvastatin  20 mg Oral Daily   spironolactone  25 mg Oral Daily   torsemide  120 mg Oral BID   venlafaxine XR  225 mg Oral Q breakfast   Continuous Infusions:  potassium chloride     PRN Meds:.acetaminophen **OR** [DISCONTINUED] acetaminophen (TYLENOL) oral liquid 160 mg/5 mL **OR** [DISCONTINUED] acetaminophen, HYDROcodone-acetaminophen, methocarbamol, metoprolol tartrate  Antibiotics  :    Anti-infectives (From admission, onward)    None         Objective:   Vitals:   06/26/23 2314 06/27/23 0413 06/27/23 0816 06/27/23 0920  BP: 122/86 120/75 110/80 134/86  Pulse: 86 75 73   Resp: 17 13 17    Temp:  98.4 F (36.9 C) 98.2 F (36.8 C)   TempSrc:  Oral Oral   SpO2: 93% 92% 93%   Weight:      Height:        Wt Readings from Last 3 Encounters:   06/24/23 (!) 169.2 kg  06/09/23 (!) 156.9 kg  05/25/23 (!) 156.9 kg     Intake/Output Summary (Last 24 hours) at 06/27/2023 0949 Last data filed at 06/27/2023 0100 Gross per 24 hour  Intake 237 ml  Output 4110 ml  Net -3873 ml     Physical Exam  Awake Alert, No new F.N deficits, left lower extremity 4/5 [place].AT,PERRAL Supple Neck, No JVD,   Symmetrical Chest wall movement, Good air movement bilaterally, CTAB RRR,No Gallops,Rubs or new Murmurs,  +ve B.Sounds, Abd Soft, No tenderness,   No Cyanosis, Clubbing or edema        Data Review:    Recent Labs  Lab 06/24/23 2109 06/24/23 2155 06/25/23 0426 06/26/23 0405 06/27/23 0410  WBC 9.0  --  9.9 9.3 10.7*  HGB 14.0 13.3 12.9* 13.1 12.3*  HCT 43.8 39.0 40.6 40.7 38.7*  PLT 265  --  263 277 264  MCV 90.5  --  91.4 90.6 90.2  MCH 28.9  --  29.1  29.2 28.7  MCHC 32.0  --  31.8 32.2 31.8  RDW 17.3*  --  17.2* 17.2* 17.0*  LYMPHSABS 1.8  --   --  1.6 1.7  MONOABS 0.8  --   --  0.7 0.8  EOSABS 0.5  --   --  0.4 0.5  BASOSABS 0.1  --   --  0.0 0.1    Recent Labs  Lab 06/24/23 2107 06/24/23 2109 06/24/23 2155 06/24/23 2328 06/25/23 0426 06/26/23 0403 06/26/23 0405 06/27/23 0410  NA 139 139 140  --  140  --  139 138  K 5.3* 4.3 3.1*  --  3.2*  --  3.2* 3.2*  CL 103 102  --   --  106  --  101 99  CO2  --  25  --   --  26  --  29 31  ANIONGAP  --  12  --   --  8  --  9 8  GLUCOSE 80 82  --   --  109*  --  101* 139*  BUN 44* 29*  --   --  30*  --  27* 23  CREATININE 1.30* 1.18  --   --  1.14  --  1.29* 1.37*  AST  --  30  --   --  15  --  13* 18  ALT  --  23  --   --  18  --  17 17  ALKPHOS  --  56  --   --  45  --  41 42  BILITOT  --  1.0  --   --  0.4  --  0.4 0.4  ALBUMIN  --  3.7  --   --  3.3*  --  3.3* 3.2*  INR  --  1.0  --   --   --   --   --   --   TSH  --   --   --  1.561  --   --   --   --   HGBA1C  --   --   --   --   --  6.5*  --   --   MG  --   --   --   --   --   --  2.3 2.5*  CALCIUM  --  9.6   --   --  8.9  --  8.4* 8.4*      Recent Labs  Lab 06/24/23 2109 06/24/23 2328 06/25/23 0426 06/26/23 0403 06/26/23 0405 06/27/23 0410  INR 1.0  --   --   --   --   --   TSH  --  1.561  --   --   --   --   HGBA1C  --   --   --  6.5*  --   --   MG  --   --   --   --  2.3 2.5*  CALCIUM 9.6  --  8.9  --  8.4* 8.4*    --------------------------------------------------------------------------------------------------------------- Lab Results  Component Value Date   CHOL 151 06/25/2023   HDL 30 (L) 06/25/2023   LDLCALC 79 06/25/2023   TRIG 209 (H) 06/25/2023   CHOLHDL 5.0 06/25/2023    Lab Results  Component Value Date   HGBA1C 6.5 (H) 06/26/2023   Recent Labs    06/24/23 2328  TSH 1.561     Radiology Reports ECHOCARDIOGRAM COMPLETE BUBBLE STUDY  Addendum Date: 06/26/2023  Status post 34 mm TAVR Medtronic evolute FX valve (02/07/2023), at the aortic position, grossly well-seated, no perivalvular leak, no valvular regurgitation, either mild valvular stenosis versus prosthesis patient mismatch (peak velocity 2.7 m/s, peak gradient 29 mmHg, mean gradient 16 mmHg, dimensional index 0.44, EOA 1.11 cm, BSA 2.8 m2, acceleration time 82 ms. Clinical correlation required. [employer] Electronically Amended 06/26/2023, 5:34 PM   Final (Amended)    Result Date: 06/26/2023    ECHOCARDIOGRAM REPORT   Patient Name:   Lawrence [name] Date of Exam: 06/26/2023 Medical Rec #:  [id number]        Height:       74.0 in Accession #:    [id number]       Weight:       373.0 lb Date of Birth:  1956-02-14        BSA:          2.833 m Patient Age:    67 years         BP:           112/74 mmHg Patient Gender: M                HR:           79 bpm. Exam Location:  Inpatient Procedure: 2D Echo, Cardiac Doppler, Color Doppler and Saline Contrast Bubble            Study Indications:    Stroke I63.9  History:        Patient has prior history of Echocardiogram examinations, most                 recent  03/10/2023. CHF, CAD, Stroke and COPD, Arrythmias:Atrial                 Fibrillation; Risk Factors:Hypertension, Dyslipidemia and Former                 Smoker.  Sonographer:    Dondra Prader RVT RCS Referring Phys: [phone] DEBBY CROSLEY  Sonographer Comments: Technically difficult study due to poor echo windows, suboptimal apical window and suboptimal subcostal window. Image acquisition challenging due to COPD and Image acquisition challenging due to patient body habitus. IMPRESSIONS  1. Technically difficult study.  2. Left ventricular ejection fraction, by estimation, is 50 to 55%. The left ventricle has low normal function. Left ventricular endocardial border not optimally defined to evaluate regional wall motion. The left ventricular internal cavity size was dilated. Left ventricular diastolic function could not be evaluated.  3. Right ventricular systolic function is normal. The right ventricular size is normal. Mildly increased right ventricular wall thickness.  4. There is no evidence of cardiac tamponade.  5. The mitral valve is grossly normal. No evidence of mitral valve regurgitation. No evidence of mitral stenosis.  6. The aortic valve was not well visualized. Aortic valve regurgitation is not visualized. But based on doppler parameter cannot rule out mild (per max velocity and MG) to moderate aortic stenosis (dimensional index).  7. Aortic ascending aorta is structurally normal, with no evidence of dilitation.  8. Agitated saline contrast bubble study was inconclusive. Conclusion(s)/Recommendation(s): No intracardiac source of embolism detected on this transthoracic study. Consider a transesophageal echocardiogram to exclude cardiac source of embolism if clinically indicated. FINDINGS  Left Ventricle: Left ventricular ejection fraction, by estimation, is 50 to 55%. The left ventricle has low normal function. Left ventricular endocardial border not optimally defined to evaluate regional wall motion. The left  ventricular internal cavity  size was dilated. There is borderline left ventricular hypertrophy. Left ventricular diastolic function could not be evaluated. Right Ventricle: The right ventricular size is normal. Mildly increased right ventricular wall thickness. Right ventricular systolic function is normal. Left Atrium: Left atrial size was normal in size. Right Atrium: Right atrial size was normal in size. Pericardium: Trivial pericardial effusion is present. There is no evidence of cardiac tamponade. Mitral Valve: The mitral valve is grossly normal. No evidence of mitral valve regurgitation. No evidence of mitral valve stenosis. Tricuspid Valve: The tricuspid valve is grossly normal. Tricuspid valve regurgitation is not demonstrated. No evidence of tricuspid stenosis. Aortic Valve: The aortic valve was not well visualized. Aortic valve regurgitation is not visualized. But based on doppler parameter cannot rule out mild (per max velocity and MG) to moderate aortic stenosis (dimensional index). Aortic valve mean gradient measures 15.7 mmHg. Aortic valve peak gradient measures 29.4 mmHg. Aortic valve area, by VTI measures 1.11 cm. Pulmonic Valve: The pulmonic valve was not well visualized. Pulmonic valve regurgitation is not visualized. No evidence of pulmonic stenosis. Aorta: Aortic root could not be assessed and ascending aorta is structurally normal, with no evidence of dilitation. IAS/Shunts: The interatrial septum was not assessed. Agitated saline contrast was given intravenously to evaluate for intracardiac shunting. Agitated saline contrast bubble study was inconclusive.  LEFT VENTRICLE PLAX 2D LVIDd:         6.30 cm LVIDs:         4.60 cm LV PW:         1.30 cm LV IVS:        1.10 cm LVOT diam:     1.80 cm LV SV:         58 LV SV Index:   21 LVOT Area:     2.54 cm  RIGHT VENTRICLE             IVC RV S prime:     16.20 cm/s  IVC diam: 3.30 cm LEFT ATRIUM             Index        RIGHT ATRIUM           Index  LA diam:        4.80 cm 1.69 cm/m   RA Area:     17.10 cm LA Vol (A2C):   62.1 ml 21.92 ml/m  RA Volume:   43.60 ml  15.39 ml/m LA Vol (A4C):   77.2 ml 27.25 ml/m LA Biplane Vol: 70.8 ml 24.99 ml/m  AORTIC VALVE                     PULMONIC VALVE AV Area (Vmax):    1.01 cm      PV Vmax:       1.23 m/s AV Area (Vmean):   1.09 cm      PV Peak grad:  6.1 mmHg AV Area (VTI):     1.11 cm AV Vmax:           271.33 cm/s AV Vmean:          181.000 cm/s AV VTI:            0.524 m AV Peak Grad:      29.4 mmHg AV Mean Grad:      15.7 mmHg LVOT Vmax:         107.42 cm/s LVOT Vmean:        77.260 cm/s LVOT VTI:  0.229 m LVOT/AV VTI ratio: 0.44  AORTA Ao Asc diam: 3.80 cm MITRAL VALVE MV Area (PHT): 4.39 cm    SHUNTS MV Decel Time: 173 msec    Systemic VTI:  0.23 m MV E velocity: 85.60 cm/s  Systemic Diam: 1.80 cm MV A velocity: 78.00 cm/s MV E/A ratio:  1.10 [employer] Electronically signed by Tessa Lerner Signature Date/Time: 06/26/2023/11:37:34 AM    Final (Amended)    MR BRAIN WO CONTRAST  Result Date: 06/25/2023 CLINICAL DATA:  Initial evaluation for neuro deficit, stroke suspected. EXAM: MRI HEAD WITHOUT CONTRAST TECHNIQUE: Multiplanar, multiecho pulse sequences of the brain and surrounding structures were obtained without intravenous contrast. COMPARISON:  Prior CTs from 06/24/2023. FINDINGS: Brain: Mild age-related cerebral volume loss. Few scattered subcentimeter foci of T2/FLAIR signal intensity seen involving the supratentorial cerebral white matter, most like related chronic microvascular ischemic disease, less than is typically seen for patient age. 5 mm focus of restricted diffusion seen involving the left cerebellum (series 5, image 57), consistent with a small acute ischemic nonhemorrhagic infarct. No other evidence for acute or subacute ischemia elsewhere. No acute or chronic intracranial blood products. No mass lesion, midline shift or mass effect. No hydrocephalus or extra-axial fluid  collection. Pituitary gland suprasellar region within normal limits. Vascular: Major intracranial vascular flow voids are maintained. Skull and upper cervical spine: Craniocervical junction within normal limits. Bone marrow signal intensity normal. No scalp soft tissue abnormality. Sinuses/Orbits: Globes and orbital soft tissues within normal limits. Mild mucosal thickening present about the sphenoid ethmoidal sinuses. No mastoid effusion. Other: None. IMPRESSION: 1. 5 mm acute ischemic nonhemorrhagic left cerebellar infarct. 2. Underlying mild age-related cerebral volume loss with mild chronic microvascular ischemic disease. Electronically Signed   By: Rise Mu M.D.   On: 06/25/2023 01:14   DG Chest Portable 1 View  Result Date: 06/24/2023 CLINICAL DATA:  Altered level of consciousness EXAM: PORTABLE CHEST 1 VIEW COMPARISON:  05/15/2023 FINDINGS: Single frontal view of the chest demonstrates a stable cardiac silhouette. Chronic scarring at the lung bases. No acute airspace disease, effusion, or pneumothorax. No acute bony abnormalities. IMPRESSION: 1. Chronic bibasilar scarring.  No acute airspace disease. Electronically Signed   By: Sharlet Salina M.D.   On: 06/24/2023 22:52   CT ANGIO HEAD NECK W WO CM  Result Date: 06/24/2023 CLINICAL DATA:  Stroke/TIA. Determine embolic source. Left-sided facial droop. EXAM: CT ANGIOGRAPHY HEAD AND NECK WITH AND WITHOUT CONTRAST TECHNIQUE: Multidetector CT imaging of the head and neck was performed using the standard protocol during bolus administration of intravenous contrast. Multiplanar CT image reconstructions and MIPs were obtained to evaluate the vascular anatomy. Carotid stenosis measurements (when applicable) are obtained utilizing NASCET criteria, using the distal internal carotid diameter as the denominator. RADIATION DOSE REDUCTION: This exam was performed according to the departmental dose-optimization program which includes automated exposure  control, adjustment of the mA and/or kV according to patient size and/or use of iterative reconstruction technique. CONTRAST:  75mL OMNIPAQUE IOHEXOL 350 MG/ML SOLN COMPARISON:  CT head without contrast 06/24/2023. CT angio head and neck 05/07/2023. FINDINGS: CTA NECK FINDINGS Aortic arch: Atherosclerotic calcifications are present at the aortic arch without significant stenosis relative to the distal vessel. A scratched at the left common carotid artery shares a common origin with the innominate artery. Right carotid system: The right common carotid artery is within normal limits. Atherosclerotic calcifications are present at the bifurcation without significant stenosis. Mild tortuosity is present in the cervical left ICA. Left carotid system:  The left common carotid artery is within normal limits. Atherosclerotic calcifications are present at the bifurcation. No significant stenosis is present. Mild tortuosity is present the cervical left ICA. Vertebral arteries: The left vertebral artery is the dominant vessel. Both vertebral arteries originate from the subclavian arteries without significant stenosis. Significant stenosis is present either vertebral artery in. Skeleton: Neck level degenerative changes are again noted in the cervical spine. No focal osseous lesions are present. Other neck: Soft tissues the neck are otherwise unremarkable. Salivary glands are within normal limits. Thyroid is normal. No significant adenopathy is present. No focal mucosal or submucosal lesions are present. Upper chest: Mild dependent atelectasis is present in the lungs bilaterally. Review of the MIP images confirms the above findings CTA HEAD FINDINGS Anterior circulation: The internal carotid arteries are within normal limits from the skull base to the ICA termini. The A1 and M1 segments are normal. The anterior communicating artery is patent. MCA bifurcations are normal. The ACA and MCA branch vessels are within normal limits. No  aneurysm is present. Posterior circulation: Left vertebral artery is dominant vessel. The PICA origins are visualized and normal. The vertebrobasilar junction and basilar artery are normal. The superior cerebellar arteries are patent. Both posterior cerebral arteries originate from basilar tip. PCA branch vessels are normal bilaterally. Venous sinuses: The dural sinuses are patent. The straight sinus and deep cerebral veins are intact. Cortical veins are within normal limits. No significant vascular malformation is evident. Anatomic variants: None Review of the MIP images confirms the above findings IMPRESSION: 1. No emergent large vessel occlusion. 2. Atherosclerotic changes at the carotid bifurcations bilaterally without significant stenosis. 3. Mild tortuosity of the cervical internal carotid arteries bilaterally likely reflects chronic hypertension. 4. Degenerative changes in the cervical spine. 5.  Aortic Atherosclerosis (ICD10-I70.0). Electronically Signed   By: Marin Roberts M.D.   On: 06/24/2023 21:33   CT HEAD CODE STROKE WO CONTRAST  Result Date: 06/24/2023 CLINICAL DATA:  Code stroke.  Initial evaluation for acute stroke. EXAM: CT HEAD WITHOUT CONTRAST TECHNIQUE: Contiguous axial images were obtained from the base of the skull through the vertex without intravenous contrast. RADIATION DOSE REDUCTION: This exam was performed according to the departmental dose-optimization program which includes automated exposure control, adjustment of the mA and/or kV according to patient size and/or use of iterative reconstruction technique. COMPARISON:  Prior study from 05/07/2023 FINDINGS: Brain: Cerebral volume within normal limits. Mild chronic microvascular ischemic disease. No acute intracranial hemorrhage. No acute large vessel territory infarct. No mass lesion or midline shift. No hydrocephalus or extra-axial fluid collection. Vascular: No abnormal hyperdense vessel. Skull: Scalp soft tissues within  normal limits.  Calvarium intact. Sinuses/Orbits: Globes and orbital soft tissues within normal limits. Mild mucosal thickening about the ethmoidal air cells. Paranasal sinuses are otherwise clear. No significant mastoid effusion. Other: None. ASPECTS [facility] Stroke Program Early CT Score) - Ganglionic level infarction (caudate, lentiform nuclei, internal capsule, insula, M1-M3 cortex): 7 - Supraganglionic infarction (M4-M6 cortex): 3 Total score (0-10 with 10 being normal): 10 IMPRESSION: 1. No acute intracranial abnormality. 2. Aspects is 10. 3. Mild chronic microvascular ischemic disease. These results were communicated to Dr. Wilford Corner at 9:14 pm on 06/24/2023 by text page via the [facility] messaging system. Electronically Signed   By: Rise Mu M.D.   On: 06/24/2023 21:15      Signature  -   Susa Raring M.D on 06/27/2023 at 9:49 AM   -  To page go to [email or web address]

## 2023-06-27 NOTE — Consult Note (Signed)
[facility] Liaison Note  06/27/2023  Lawrence [name] 1956-05-24 [id number]  Covering [name] [facility] hospital liaison)  Location: [facility] Liaison screened the patient remotely at [facility].  Insurance:  [employer]   Lawrence [name] is a 67 y.o. male who is a Primary Care Patient of [name], PA-[facility]. The patient was screened for  readmission hospitalization with noted extreme risk score for unplanned readmission risk with 3 IP/1 ED in 6 months.  The patient was assessed for potential Care Management service needs for post hospital transition for care coordination. Review of patient's electronic medical record reveals patient was admitted with Acute CVA. Care management services will be provided by his physician's office Deboraha Sprang) post hospital discharge. No anticipated needs via VBCI services at this time.  Plan: [facility] Liaison will continue to follow progress and disposition to asess for post hospital community care coordination/management needs.  Referral request for community care coordination: pending disposition.   VBCI Care Management/Population Health does not replace or interfere with any arrangements made by the Inpatient Transition of Care team.   For questions contact:   Elliot Cousin, RN, [facility] Liaison [place]   [facility], Population Health Office Hours MTWF  8:00 am-6:00 pm Direct Dial: [phone] mobile [phone] [Office toll free line] Office Hours are M-F 8:30 - 5 pm [email or web address]

## 2023-06-27 NOTE — Progress Notes (Signed)
   06/27/23 2203  BiPAP/CPAP/SIPAP  $ Non-Invasive Home Ventilator  Initial  BiPAP/CPAP/SIPAP Pt Type Adult  BiPAP/CPAP/SIPAP Resmed  Mask Type Full face mask  Mask Size Large  Patient Home Equipment Yes  Safety Check Completed by RT for Home Unit Yes, no issues noted   Pt resting comfortably with his home cpap on.  Has been instructed to call for RT if there are any issues with his home unit.  RT will continue to monitor

## 2023-06-27 NOTE — Plan of Care (Signed)
Problem: Education: Goal: Ability to describe self-care measures that may prevent or decrease complications (Diabetes Survival Skills Education) will improve Outcome: Progressing Goal: Individualized Educational Video(s) Outcome: Progressing   Problem: Coping: Goal: Ability to adjust to condition or change in health will improve Outcome: Progressing   Problem: Fluid Volume: Goal: Ability to maintain a balanced intake and output will improve Outcome: Progressing   Problem: Health Behavior/Discharge Planning: Goal: Ability to identify and utilize available resources and services will improve Outcome: Progressing Goal: Ability to manage health-related needs will improve Outcome: Progressing   Problem: Metabolic: Goal: Ability to maintain appropriate glucose levels will improve Outcome: Progressing   Problem: Nutritional: Goal: Maintenance of adequate nutrition will improve Outcome: Progressing Goal: Progress toward achieving an optimal weight will improve Outcome: Progressing   Problem: Skin Integrity: Goal: Risk for impaired skin integrity will decrease Outcome: Progressing   Problem: Tissue Perfusion: Goal: Adequacy of tissue perfusion will improve Outcome: Progressing   Problem: Education: Goal: Knowledge of disease or condition will improve Outcome: Progressing Goal: Knowledge of secondary prevention will improve (MUST DOCUMENT ALL) Outcome: Progressing Goal: Knowledge of patient specific risk factors will improve Elta [place] N/A or DELETE if not current risk factor) Outcome: Progressing   Problem: Ischemic Stroke/TIA Tissue Perfusion: Goal: Complications of ischemic stroke/TIA will be minimized Outcome: Progressing   Problem: Coping: Goal: Will verbalize positive feelings about self Outcome: Progressing Goal: Will identify appropriate support needs Outcome: Progressing   Problem: Health Behavior/Discharge Planning: Goal: Ability to manage health-related needs  will improve Outcome: Progressing Goal: Goals will be collaboratively established with patient/family Outcome: Progressing   Problem: Self-Care: Goal: Ability to participate in self-care as condition permits will improve Outcome: Progressing Goal: Verbalization of feelings and concerns over difficulty with self-care will improve Outcome: Progressing Goal: Ability to communicate needs accurately will improve Outcome: Progressing   Problem: Nutrition: Goal: Risk of aspiration will decrease Outcome: Progressing Goal: Dietary intake will improve Outcome: Progressing   Problem: Education: Goal: Knowledge of General Education information will improve Description: Including pain rating scale, medication(s)/side effects and non-pharmacologic comfort measures Outcome: Progressing   Problem: Health Behavior/Discharge Planning: Goal: Ability to manage health-related needs will improve Outcome: Progressing   Problem: Clinical Measurements: Goal: Ability to maintain clinical measurements within normal limits will improve Outcome: Progressing Goal: Will remain free from infection Outcome: Progressing Goal: Diagnostic test results will improve Outcome: Progressing Goal: Respiratory complications will improve Outcome: Progressing Goal: Cardiovascular complication will be avoided Outcome: Progressing   Problem: Activity: Goal: Risk for activity intolerance will decrease Outcome: Progressing   Problem: Nutrition: Goal: Adequate nutrition will be maintained Outcome: Progressing   Problem: Coping: Goal: Level of anxiety will decrease Outcome: Progressing   Problem: Elimination: Goal: Will not experience complications related to bowel motility Outcome: Progressing Goal: Will not experience complications related to urinary retention Outcome: Progressing   Problem: Education: Goal: Knowledge of disease or condition will improve Outcome: Progressing Goal: Knowledge of secondary  prevention will improve (MUST DOCUMENT ALL) Outcome: Progressing Goal: Knowledge of patient specific risk factors will improve Elta [place] N/A or DELETE if not current risk factor) Outcome: Progressing   Problem: Ischemic Stroke/TIA Tissue Perfusion: Goal: Complications of ischemic stroke/TIA will be minimized Outcome: Progressing   Problem: Coping: Goal: Will verbalize positive feelings about self Outcome: Progressing Goal: Will identify appropriate support needs Outcome: Progressing   Problem: Health Behavior/Discharge Planning: Goal: Ability to manage health-related needs will improve Outcome: Progressing Goal: Goals will be collaboratively established with patient/family Outcome: Progressing   Problem:  Self-Care: Goal: Ability to participate in self-care as condition permits will improve Outcome: Progressing Goal: Verbalization of feelings and concerns over difficulty with self-care will improve Outcome: Progressing Goal: Ability to communicate needs accurately will improve Outcome: Progressing   Problem: Nutrition: Goal: Risk of aspiration will decrease Outcome: Progressing Goal: Dietary intake will improve Outcome: Progressing

## 2023-06-27 NOTE — Evaluation (Signed)
Speech Language Pathology Evaluation Patient Details Name: Lawrence [name] MRN: [id number] DOB: [birth year] Today's Date: 06/27/2023 Time: 4098-1191 SLP Time Calculation (min) (ACUTE ONLY): 10 min  Problem List:  Patient Active Problem List   Diagnosis Date Noted   CVA (cerebral vascular accident) (HCC) 06/25/2023   Atrial fibrillation, chronic (HCC) 06/25/2023   Acute CVA (cerebrovascular accident) (HCC) 06/25/2023   S/P TAVR (transcatheter aortic valve replacement) 02/07/2023   Acute hypoxic respiratory failure (HCC) 01/23/2023   Nail, injury by, initial encounter 12/09/2022   Heart failure (HCC) 10/27/2022   Gout attack 10/27/2022   CHF (congestive heart failure) (HCC) 10/26/2022   Right sided weakness 05/26/2022   Dysarthria 05/25/2022   Ischemic stroke (HCC) 02/25/2022   Hemiparesis affecting left side as late effect of cerebrovascular accident (CVA) (HCC) 02/25/2022   Durable power of attorney for healthcare exists  02/25/2022   Depression 08/07/2021   Paronychia of great toe, left 07/07/2021   Stroke-like symptoms 04/27/2021   Ambulatory dysfunction 04/27/2021   Left-sided weakness 02/09/2021   Atrial fibrillation with rapid ventricular response (HCC) 02/08/2021   AF (paroxysmal atrial fibrillation) (HCC) 01/25/2021   COPD (chronic obstructive pulmonary disease) (HCC) 01/25/2021   Aortic stenosis 01/21/2021   Acquired dilation of ascending aorta and aortic root (HCC) 01/21/2021   Coronary artery disease due to lipid rich plaque    SVT (supraventricular tachycardia) (HCC)    Seizure disorder (HCC)    Essential hypertension 10/17/2020   Hypokalemia 10/17/2020   Cellulitis of right leg 12/04/2019   Pain due to onychomycosis of toenails of both feet 09/11/2019   Diabetic neuropathy (HCC) 09/11/2019   Type 2 diabetes mellitus with hyperlipidemia (HCC) 09/11/2019   Sinus tachycardia 03/07/2019   Chronic venous stasis 03/07/2019   History of anemia 12/26/2018   Anemia  08/24/2018   History of colon polyps 08/24/2018   Do not resuscitate status 08/24/2018   Chronic diastolic CHF (congestive heart failure) (HCC) 08/18/2018   Coronary artery calcification seen on CAT scan    Pure hypercholesterolemia    Chest pain    Shortness of breath    Right foot pain 01/13/2018   Acute on chronic diastolic CHF (congestive heart failure) (HCC) 01/13/2018   Type 2 diabetes mellitus with obesity (HCC) 01/13/2018   Lower extremity cellulitis 11/07/2017   Primary osteoarthritis, left shoulder 03/05/2017   BPH without obstruction/lower urinary tract symptoms 02/22/2017   Hematuria 02/22/2017   Peripheral neuropathy 02/22/2017   Physical deconditioning 02/22/2017   Class 3 obesity    PTSD (post-traumatic stress disorder)    Anasarca 01/31/2017   Bilateral lower extremity edema 01/31/2017   OSA (obstructive sleep apnea)    Past Medical History:  Past Medical History:  Diagnosis Date   Acquired dilation of ascending aorta and aortic root (HCC)    40mm by echo 01/2021   Adenomatous colon polyp 2007   Anemia    Anxiety    Asthma    BPH without obstruction/lower urinary tract symptoms 02/22/2017   Chronic diastolic (congestive) heart failure (HCC)    Chronic venous stasis 03/07/2019   COPD (chronic obstructive pulmonary disease) (HCC)    Coronary artery calcification seen on CAT scan    Depression    Diabetic neuropathy (HCC) 09/11/2019   History of colon polyps 08/24/2018   Hypertension    Morbid obesity (HCC)    OSA (obstructive sleep apnea)    Pain due to onychomycosis of toenails of both feet 09/11/2019   Peripheral neuropathy 02/22/2017   Primary  osteoarthritis, left shoulder 03/05/2017   PTSD (post-traumatic stress disorder)    Pure hypercholesterolemia    QT prolongation 03/07/2019   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 Lynnette Caffey and Dr. Laneta Simmers   Seizures [facility])    Severe aortic stenosis     Sinus tachycardia 03/07/2019   Sleep apnea    CPAP   Type 2 diabetes mellitus with vascular disease (HCC) 09/11/2019   Past Surgical History:  Past Surgical History:  Procedure Laterality Date   ENDOVENOUS ABLATION SAPHENOUS VEIN W/ LASER Right 08/20/2020   endovenous laser ablation right greater saphenous vein by Cari Caraway MD    ENDOVENOUS ABLATION SAPHENOUS VEIN W/ LASER Left 11/16/2022   endovenous laser ablation left greater saphenous vein by Cari Caraway MD   INTRAOPERATIVE TRANSTHORACIC ECHOCARDIOGRAM N/A 02/07/2023   Procedure: INTRAOPERATIVE TRANSTHORACIC ECHOCARDIOGRAM;  Surgeon: Orbie Pyo, MD;  Location: [facility] INVASIVE CV LAB;  Service: Open Heart Surgery;  Laterality: N/A;   JOINT REPLACEMENT     left knee replacement x 2   KNEE ARTHROSCOPY Bilateral    LEFT HEART CATH AND CORONARY ANGIOGRAPHY N/A 01/17/2018   Procedure: LEFT HEART CATH AND CORONARY ANGIOGRAPHY;  Surgeon: Marykay Lex, MD;  Location: [facility] INVASIVE CV LAB;  Service: Cardiovascular;  Laterality: N/A;   PRESSURE SENSOR/CARDIOMEMS N/A 08/26/2021   Procedure: PRESSURE SENSOR/CARDIOMEMS;  Surgeon: Laurey Morale, MD;  Location: [facility] INVASIVE CV LAB;  Service: Cardiovascular;  Laterality: N/A;   RIGHT HEART CATH N/A 08/26/2021   Procedure: RIGHT HEART CATH;  Surgeon: Laurey Morale, MD;  Location: [facility] INVASIVE CV LAB;  Service: Cardiovascular;  Laterality: N/A;   RIGHT HEART CATH AND CORONARY ANGIOGRAPHY N/A 01/26/2023   Procedure: RIGHT HEART CATH AND CORONARY ANGIOGRAPHY;  Surgeon: Orbie Pyo, MD;  Location: [facility] INVASIVE CV LAB;  Service: Cardiovascular;  Laterality: N/A;   TEE WITHOUT CARDIOVERSION N/A 08/26/2021   Procedure: TRANSESOPHAGEAL ECHOCARDIOGRAM (TEE);  Surgeon: Laurey Morale, MD;  Location: [facility] ENDOSCOPY;  Service: Cardiovascular;  Laterality: N/A;   TOOTH EXTRACTION N/A 02/03/2023   Procedure: DENTAL RESTORATION/EXTRACTIONS;  Surgeon: [name], DMD;  Location: [facility] OR;  Service:  Oral Surgery;  Laterality: N/A;   TRANSCATHETER AORTIC VALVE REPLACEMENT, TRANSFEMORAL Right 02/07/2023   Procedure: Transcatheter Aortic Valve Replacement, Transfemoral;  Surgeon: Orbie Pyo, MD;  Location: [facility] INVASIVE CV LAB;  Service: Open Heart Surgery;  Laterality: Right;   UMBILICAL HERNIA REPAIR     HPI:  Lawrence [name] is a 67 yo male presenting to ED 11/17 with AMS and L sided weakness. MRI Brain with acute ischemic nonhemorrhagic L cerebellar infarct. Previously seen by SLP 05/26/22 with overall WFL performance on a standardized cognitive evaluation, but with increased difficulty with word finding and processing. PMH includes A-fib, prior CVA with residual L sided weakness, T2DM, history of seizure activity, CAD, COPD, OSA, aortic stenosis s/p TAVR   Assessment / Plan / Recommendation Clinical Impression  Pt reports that he is typically independent with all ADLs and lives with his significant other. Pt scored 23/30 on the SLUMS (a score of 27 or above is considered WFL) characterized by deficits related to memory (retrieval > storage), sustained attention, and problem solving. He had difficulty with a simple calculation and was only able to name 12 animals during a one minute divergent naming activity. Following a delay, pt only able to recall 2/5 novel words. Per previous SLP notes and pt reports, this is  significantly different than baseline. He also endorses mild word finding difficulties, which could be attributed to processing. Recommend ongoing SLP f/u to address deficits listed above as well as intensive f/u upon d/c. Will continue following.    SLP Assessment  SLP Recommendation/Assessment: Patient needs continued Speech Lanaguage Pathology Services SLP Visit Diagnosis: Cognitive communication deficit (R41.841)    Recommendations for follow up therapy are one component of a multi-disciplinary discharge planning process, led by the attending physician.  Recommendations may be  updated based on patient status, additional functional criteria and insurance authorization.    Follow Up Recommendations  Acute inpatient rehab (3hours/day)    Assistance Recommended at Discharge  Frequent or constant Supervision/Assistance  Functional Status Assessment Patient has had a recent decline in their functional status and demonstrates the ability to make significant improvements in function in a reasonable and predictable amount of time.  Frequency and Duration min 2x/week  1 week      SLP Evaluation Cognition  Overall Cognitive Status: Impaired/Different from baseline Arousal/Alertness: Awake/alert Orientation Level: Oriented X4 Attention: Sustained Sustained Attention: Impaired Sustained Attention Impairment: Verbal basic Memory: Impaired Memory Impairment: Retrieval deficit Awareness: Appears intact Problem Solving: Impaired Problem Solving Impairment: Verbal basic       Comprehension  Auditory Comprehension Overall Auditory Comprehension: Appears within functional limits for tasks assessed    Expression Expression Primary Mode of Expression: Verbal Verbal Expression Overall Verbal Expression: Appears within functional limits for tasks assessed Written Expression Dominant Hand: Right   Oral / Motor  Oral Motor/Sensory Function Overall Oral Motor/Sensory Function: Within functional limits Motor Speech Overall Motor Speech: Appears within functional limits for tasks assessed            Gwynneth Aliment, M.A., CF-SLP Speech Language Pathology, Acute Rehabilitation Services  Secure Chat preferred [phone]  06/27/2023, 4:21 PM

## 2023-06-28 ENCOUNTER — Inpatient Hospital Stay (HOSPITAL_COMMUNITY)
Admission: AD | Admit: 2023-06-28 | Discharge: 2023-07-12 | DRG: 057 | Disposition: A | Discharge status: Home Health | Payer: No Typology Code available for payment source | Source: Intra-hospital | Attending: Physical Medicine & Rehabilitation | Admitting: Physical Medicine & Rehabilitation

## 2023-06-28 ENCOUNTER — Encounter (HOSPITAL_COMMUNITY): Payer: Self-pay | Admitting: Physical Medicine & Rehabilitation

## 2023-06-28 ENCOUNTER — Other Ambulatory Visit: Payer: Self-pay

## 2023-06-28 DIAGNOSIS — I83019 Varicose veins of right lower extremity with ulcer of unspecified site: Secondary | ICD-10-CM | POA: Diagnosis present

## 2023-06-28 DIAGNOSIS — G629 Polyneuropathy, unspecified: Secondary | ICD-10-CM

## 2023-06-28 DIAGNOSIS — F411 Generalized anxiety disorder: Secondary | ICD-10-CM | POA: Diagnosis present

## 2023-06-28 DIAGNOSIS — I77810 Thoracic aortic ectasia: Secondary | ICD-10-CM | POA: Diagnosis present

## 2023-06-28 DIAGNOSIS — M109 Gout, unspecified: Secondary | ICD-10-CM

## 2023-06-28 DIAGNOSIS — R600 Localized edema: Secondary | ICD-10-CM | POA: Diagnosis not present

## 2023-06-28 DIAGNOSIS — I639 Cerebral infarction, unspecified: Secondary | ICD-10-CM | POA: Diagnosis not present

## 2023-06-28 DIAGNOSIS — Z952 Presence of prosthetic heart valve: Secondary | ICD-10-CM

## 2023-06-28 DIAGNOSIS — E1142 Type 2 diabetes mellitus with diabetic polyneuropathy: Secondary | ICD-10-CM | POA: Diagnosis present

## 2023-06-28 DIAGNOSIS — K5900 Constipation, unspecified: Secondary | ICD-10-CM

## 2023-06-28 DIAGNOSIS — N1831 Chronic kidney disease, stage 3a: Secondary | ICD-10-CM

## 2023-06-28 DIAGNOSIS — Z888 Allergy status to other drugs, medicaments and biological substances status: Secondary | ICD-10-CM

## 2023-06-28 DIAGNOSIS — I63542 Cerebral infarction due to unspecified occlusion or stenosis of left cerebellar artery: Secondary | ICD-10-CM | POA: Diagnosis not present

## 2023-06-28 DIAGNOSIS — R000 Tachycardia, unspecified: Secondary | ICD-10-CM | POA: Diagnosis present

## 2023-06-28 DIAGNOSIS — Z860101 Personal history of adenomatous and serrated colon polyps: Secondary | ICD-10-CM

## 2023-06-28 DIAGNOSIS — G4733 Obstructive sleep apnea (adult) (pediatric): Secondary | ICD-10-CM | POA: Diagnosis present

## 2023-06-28 DIAGNOSIS — I480 Paroxysmal atrial fibrillation: Secondary | ICD-10-CM | POA: Diagnosis present

## 2023-06-28 DIAGNOSIS — E1140 Type 2 diabetes mellitus with diabetic neuropathy, unspecified: Secondary | ICD-10-CM | POA: Diagnosis not present

## 2023-06-28 DIAGNOSIS — Z8249 Family history of ischemic heart disease and other diseases of the circulatory system: Secondary | ICD-10-CM

## 2023-06-28 DIAGNOSIS — E1169 Type 2 diabetes mellitus with other specified complication: Secondary | ICD-10-CM | POA: Diagnosis not present

## 2023-06-28 DIAGNOSIS — R0602 Shortness of breath: Secondary | ICD-10-CM | POA: Diagnosis not present

## 2023-06-28 DIAGNOSIS — Z96652 Presence of left artificial knee joint: Secondary | ICD-10-CM | POA: Diagnosis present

## 2023-06-28 DIAGNOSIS — I959 Hypotension, unspecified: Secondary | ICD-10-CM

## 2023-06-28 DIAGNOSIS — I5033 Acute on chronic diastolic (congestive) heart failure: Secondary | ICD-10-CM | POA: Diagnosis not present

## 2023-06-28 DIAGNOSIS — F09 Unspecified mental disorder due to known physiological condition: Secondary | ICD-10-CM

## 2023-06-28 DIAGNOSIS — G894 Chronic pain syndrome: Secondary | ICD-10-CM | POA: Diagnosis not present

## 2023-06-28 DIAGNOSIS — N179 Acute kidney failure, unspecified: Secondary | ICD-10-CM

## 2023-06-28 DIAGNOSIS — G459 Transient cerebral ischemic attack, unspecified: Secondary | ICD-10-CM | POA: Diagnosis not present

## 2023-06-28 DIAGNOSIS — F410 Panic disorder [episodic paroxysmal anxiety] without agoraphobia: Secondary | ICD-10-CM | POA: Diagnosis present

## 2023-06-28 DIAGNOSIS — G40909 Epilepsy, unspecified, not intractable, without status epilepticus: Secondary | ICD-10-CM | POA: Diagnosis present

## 2023-06-28 DIAGNOSIS — E876 Hypokalemia: Secondary | ICD-10-CM | POA: Diagnosis not present

## 2023-06-28 DIAGNOSIS — Z7951 Long term (current) use of inhaled steroids: Secondary | ICD-10-CM

## 2023-06-28 DIAGNOSIS — I10 Essential (primary) hypertension: Secondary | ICD-10-CM | POA: Diagnosis present

## 2023-06-28 DIAGNOSIS — R001 Bradycardia, unspecified: Secondary | ICD-10-CM | POA: Diagnosis not present

## 2023-06-28 DIAGNOSIS — I130 Hypertensive heart and chronic kidney disease with heart failure and stage 1 through stage 4 chronic kidney disease, or unspecified chronic kidney disease: Secondary | ICD-10-CM | POA: Diagnosis present

## 2023-06-28 DIAGNOSIS — R008 Other abnormalities of heart beat: Secondary | ICD-10-CM | POA: Diagnosis not present

## 2023-06-28 DIAGNOSIS — R4189 Other symptoms and signs involving cognitive functions and awareness: Secondary | ICD-10-CM | POA: Diagnosis present

## 2023-06-28 DIAGNOSIS — I951 Orthostatic hypotension: Secondary | ICD-10-CM

## 2023-06-28 DIAGNOSIS — Z7985 Long-term (current) use of injectable non-insulin antidiabetic drugs: Secondary | ICD-10-CM

## 2023-06-28 DIAGNOSIS — Z9682 Presence of neurostimulator: Secondary | ICD-10-CM

## 2023-06-28 DIAGNOSIS — R0989 Other specified symptoms and signs involving the circulatory and respiratory systems: Secondary | ICD-10-CM | POA: Diagnosis not present

## 2023-06-28 DIAGNOSIS — F329 Major depressive disorder, single episode, unspecified: Secondary | ICD-10-CM | POA: Diagnosis present

## 2023-06-28 DIAGNOSIS — F32A Depression, unspecified: Secondary | ICD-10-CM

## 2023-06-28 DIAGNOSIS — E7800 Pure hypercholesterolemia, unspecified: Secondary | ICD-10-CM | POA: Diagnosis present

## 2023-06-28 DIAGNOSIS — I4891 Unspecified atrial fibrillation: Secondary | ICD-10-CM

## 2023-06-28 DIAGNOSIS — Z79899 Other long term (current) drug therapy: Secondary | ICD-10-CM

## 2023-06-28 DIAGNOSIS — E1165 Type 2 diabetes mellitus with hyperglycemia: Secondary | ICD-10-CM

## 2023-06-28 DIAGNOSIS — L97911 Non-pressure chronic ulcer of unspecified part of right lower leg limited to breakdown of skin: Secondary | ICD-10-CM | POA: Diagnosis present

## 2023-06-28 DIAGNOSIS — M792 Neuralgia and neuritis, unspecified: Secondary | ICD-10-CM | POA: Diagnosis not present

## 2023-06-28 DIAGNOSIS — L97921 Non-pressure chronic ulcer of unspecified part of left lower leg limited to breakdown of skin: Secondary | ICD-10-CM | POA: Diagnosis present

## 2023-06-28 DIAGNOSIS — I5032 Chronic diastolic (congestive) heart failure: Secondary | ICD-10-CM | POA: Diagnosis not present

## 2023-06-28 DIAGNOSIS — I493 Ventricular premature depolarization: Secondary | ICD-10-CM | POA: Diagnosis not present

## 2023-06-28 DIAGNOSIS — D72829 Elevated white blood cell count, unspecified: Secondary | ICD-10-CM | POA: Diagnosis present

## 2023-06-28 DIAGNOSIS — I69354 Hemiplegia and hemiparesis following cerebral infarction affecting left non-dominant side: Principal | ICD-10-CM

## 2023-06-28 DIAGNOSIS — Z794 Long term (current) use of insulin: Secondary | ICD-10-CM

## 2023-06-28 DIAGNOSIS — Z87891 Personal history of nicotine dependence: Secondary | ICD-10-CM

## 2023-06-28 DIAGNOSIS — I83029 Varicose veins of left lower extremity with ulcer of unspecified site: Secondary | ICD-10-CM | POA: Diagnosis present

## 2023-06-28 DIAGNOSIS — I517 Cardiomegaly: Secondary | ICD-10-CM | POA: Diagnosis not present

## 2023-06-28 DIAGNOSIS — Z953 Presence of xenogenic heart valve: Secondary | ICD-10-CM | POA: Diagnosis not present

## 2023-06-28 DIAGNOSIS — I878 Other specified disorders of veins: Secondary | ICD-10-CM | POA: Diagnosis present

## 2023-06-28 DIAGNOSIS — E119 Type 2 diabetes mellitus without complications: Secondary | ICD-10-CM

## 2023-06-28 DIAGNOSIS — I482 Chronic atrial fibrillation, unspecified: Secondary | ICD-10-CM | POA: Diagnosis present

## 2023-06-28 DIAGNOSIS — Z833 Family history of diabetes mellitus: Secondary | ICD-10-CM

## 2023-06-28 DIAGNOSIS — Z7901 Long term (current) use of anticoagulants: Secondary | ICD-10-CM

## 2023-06-28 DIAGNOSIS — R531 Weakness: Secondary | ICD-10-CM | POA: Diagnosis present

## 2023-06-28 DIAGNOSIS — R079 Chest pain, unspecified: Secondary | ICD-10-CM | POA: Diagnosis not present

## 2023-06-28 DIAGNOSIS — J4489 Other specified chronic obstructive pulmonary disease: Secondary | ICD-10-CM | POA: Diagnosis present

## 2023-06-28 DIAGNOSIS — I2510 Atherosclerotic heart disease of native coronary artery without angina pectoris: Secondary | ICD-10-CM | POA: Diagnosis present

## 2023-06-28 DIAGNOSIS — Z881 Allergy status to other antibiotic agents status: Secondary | ICD-10-CM

## 2023-06-28 DIAGNOSIS — I509 Heart failure, unspecified: Secondary | ICD-10-CM

## 2023-06-28 DIAGNOSIS — I440 Atrioventricular block, first degree: Secondary | ICD-10-CM | POA: Diagnosis not present

## 2023-06-28 DIAGNOSIS — Z6841 Body Mass Index (BMI) 40.0 and over, adult: Secondary | ICD-10-CM | POA: Diagnosis not present

## 2023-06-28 DIAGNOSIS — J441 Chronic obstructive pulmonary disease with (acute) exacerbation: Secondary | ICD-10-CM

## 2023-06-28 DIAGNOSIS — E1122 Type 2 diabetes mellitus with diabetic chronic kidney disease: Secondary | ICD-10-CM | POA: Diagnosis present

## 2023-06-28 DIAGNOSIS — F5104 Psychophysiologic insomnia: Secondary | ICD-10-CM | POA: Diagnosis present

## 2023-06-28 DIAGNOSIS — Z713 Dietary counseling and surveillance: Secondary | ICD-10-CM

## 2023-06-28 DIAGNOSIS — Z7401 Bed confinement status: Secondary | ICD-10-CM | POA: Diagnosis not present

## 2023-06-28 DIAGNOSIS — G4700 Insomnia, unspecified: Secondary | ICD-10-CM | POA: Diagnosis not present

## 2023-06-28 DIAGNOSIS — F5101 Primary insomnia: Secondary | ICD-10-CM | POA: Diagnosis not present

## 2023-06-28 DIAGNOSIS — F4312 Post-traumatic stress disorder, chronic: Secondary | ICD-10-CM | POA: Diagnosis present

## 2023-06-28 DIAGNOSIS — N400 Enlarged prostate without lower urinary tract symptoms: Secondary | ICD-10-CM | POA: Diagnosis present

## 2023-06-28 DIAGNOSIS — R0902 Hypoxemia: Secondary | ICD-10-CM | POA: Diagnosis not present

## 2023-06-28 DIAGNOSIS — Z7984 Long term (current) use of oral hypoglycemic drugs: Secondary | ICD-10-CM

## 2023-06-28 DIAGNOSIS — G8929 Other chronic pain: Secondary | ICD-10-CM | POA: Diagnosis present

## 2023-06-28 LAB — COMPREHENSIVE METABOLIC PANEL
ALT: 16 U/L (ref 0–44)
AST: 16 U/L (ref 15–41)
Albumin: 3.4 g/dL — ABNORMAL LOW (ref 3.5–5.0)
Alkaline Phosphatase: 44 U/L (ref 38–126)
Anion gap: 9 (ref 5–15)
BUN: 23 mg/dL (ref 8–23)
CO2: 29 mmol/L (ref 22–32)
Calcium: 8.7 mg/dL — ABNORMAL LOW (ref 8.9–10.3)
Chloride: 101 mmol/L (ref 98–111)
Creatinine, Ser: 1.04 mg/dL (ref 0.61–1.24)
GFR, Estimated: >60 mL/min (ref >60)
Glucose, Bld: 91 mg/dL (ref 70–99)
Potassium: 3.6 mmol/L (ref 3.5–5.1)
Sodium: 139 mmol/L (ref 135–145)
Total Bilirubin: 0.7 mg/dL (ref <1.2)
Total Protein: 6.6 g/dL (ref 6.5–8.1)

## 2023-06-28 LAB — CBC WITH DIFFERENTIAL/PLATELET
Abs Immature Granulocytes: 0.07 10*3/uL (ref 0.00–0.07)
Basophils Absolute: 0 10*3/uL (ref 0.0–0.1)
Basophils Relative: 0 %
Eosinophils Absolute: 0.4 10*3/uL (ref 0.0–0.5)
Eosinophils Relative: 4 %
HCT: 40.1 % (ref 39.0–52.0)
Hemoglobin: 12.8 g/dL — ABNORMAL LOW (ref 13.0–17.0)
Immature Granulocytes: 1 %
Lymphocytes Relative: 20 %
Lymphs Abs: 2.1 10*3/uL (ref 0.7–4.0)
MCH: 28.3 pg (ref 26.0–34.0)
MCHC: 31.9 g/dL (ref 30.0–36.0)
MCV: 88.7 fL (ref 80.0–100.0)
Monocytes Absolute: 0.8 10*3/uL (ref 0.1–1.0)
Monocytes Relative: 8 %
Neutro Abs: 7 10*3/uL (ref 1.7–7.7)
Neutrophils Relative %: 67 %
Platelets: 291 10*3/uL (ref 150–400)
RBC: 4.52 MIL/uL (ref 4.22–5.81)
RDW: 16.4 % — ABNORMAL HIGH (ref 11.5–15.5)
WBC: 10.4 10*3/uL (ref 4.0–10.5)
nRBC: 0 % (ref 0.0–0.2)

## 2023-06-28 LAB — GLUCOSE, CAPILLARY
Glucose-Capillary: 122 mg/dL — ABNORMAL HIGH (ref 70–99)
Glucose-Capillary: 229 mg/dL — ABNORMAL HIGH (ref 70–99)
Glucose-Capillary: 76 mg/dL (ref 70–99)
Glucose-Capillary: 164 mg/dL — ABNORMAL HIGH (ref 70–99)

## 2023-06-28 LAB — PHOSPHORUS: Phosphorus: 3.4 mg/dL (ref 2.5–4.6)

## 2023-06-28 LAB — MAGNESIUM: Magnesium: 2.5 mg/dL — ABNORMAL HIGH (ref 1.7–2.4)

## 2023-06-28 MED ORDER — HYDROCODONE-ACETAMINOPHEN 5-325 MG PO TABS
1.0000 | ORAL_TABLET | Freq: Four times a day (QID) | ORAL | Status: DC | PRN
  Administered 2023-06-28: 1 via ORAL
  Filled 2023-06-28: qty 1

## 2023-06-28 MED ORDER — ROSUVASTATIN CALCIUM 20 MG PO TABS
20.0000 mg | ORAL_TABLET | Freq: Every day | ORAL | Status: DC
  Administered 2023-06-29 – 2023-07-02 (×4): 20 mg via ORAL
  Filled 2023-06-28 (×4): qty 1

## 2023-06-28 MED ORDER — LEVETIRACETAM 250 MG PO TABS
500.0000 mg | ORAL_TABLET | Freq: Two times a day (BID) | ORAL | Status: DC
  Administered 2023-06-28 – 2023-07-12 (×28): 500 mg via ORAL
  Filled 2023-06-28 (×28): qty 2

## 2023-06-28 MED ORDER — ACETAMINOPHEN 325 MG PO TABS
325.0000 mg | ORAL_TABLET | ORAL | Status: DC | PRN
  Administered 2023-06-29 – 2023-07-01 (×4): 650 mg via ORAL
  Administered 2023-07-02: 325 mg via ORAL
  Administered 2023-07-02 – 2023-07-03 (×4): 650 mg via ORAL
  Administered 2023-07-04 – 2023-07-05 (×2): 325 mg via ORAL
  Administered 2023-07-05 – 2023-07-11 (×8): 650 mg via ORAL
  Administered 2023-07-12: 325 mg via ORAL
  Filled 2023-06-28 (×23): qty 2

## 2023-06-28 MED ORDER — INSULIN ASPART 100 UNIT/ML IJ SOLN
12.0000 [IU] | Freq: Three times a day (TID) | INTRAMUSCULAR | Status: DC
  Administered 2023-06-29 – 2023-07-05 (×20): 12 [IU] via SUBCUTANEOUS

## 2023-06-28 MED ORDER — PROCHLORPERAZINE 25 MG RE SUPP: 12.5000 mg | Freq: Four times a day (QID) | RECTAL | Status: DC | PRN

## 2023-06-28 MED ORDER — PROCHLORPERAZINE EDISYLATE 10 MG/2ML IJ SOLN: 5.0000 mg | Freq: Four times a day (QID) | INTRAMUSCULAR | Status: DC | PRN

## 2023-06-28 MED ORDER — METHOCARBAMOL 500 MG PO TABS
500.0000 mg | ORAL_TABLET | Freq: Three times a day (TID) | ORAL | Status: DC | PRN
  Administered 2023-06-28 – 2023-07-12 (×22): 500 mg via ORAL
  Filled 2023-06-28 (×24): qty 1

## 2023-06-28 MED ORDER — METOLAZONE 5 MG PO TABS
5.0000 mg | ORAL_TABLET | Freq: Once | ORAL | Status: AC
  Administered 2023-06-28: 5 mg via ORAL
  Filled 2023-06-28: qty 1

## 2023-06-28 MED ORDER — PROCHLORPERAZINE MALEATE 5 MG PO TABS: 5.0000 mg | ORAL_TABLET | Freq: Four times a day (QID) | ORAL | Status: DC | PRN

## 2023-06-28 MED ORDER — VITAMIN B-12 1000 MCG PO TABS
1000.0000 ug | ORAL_TABLET | Freq: Every day | ORAL | Status: DC
  Administered 2023-06-29 – 2023-07-12 (×14): 1000 ug via ORAL
  Filled 2023-06-28 (×14): qty 1

## 2023-06-28 MED ORDER — UMECLIDINIUM BROMIDE 62.5 MCG/ACT IN AEPB
1.0000 | INHALATION_SPRAY | Freq: Every day | RESPIRATORY_TRACT | Status: DC
  Administered 2023-06-29 – 2023-07-12 (×10): 1 via RESPIRATORY_TRACT
  Filled 2023-06-28 (×2): qty 7

## 2023-06-28 MED ORDER — NOVOLOG FLEXPEN 100 UNIT/ML ~~LOC~~ SOPN: PEN_INJECTOR | SUBCUTANEOUS | Status: DC

## 2023-06-28 MED ORDER — PREGABALIN 50 MG PO CAPS
150.0000 mg | ORAL_CAPSULE | Freq: Three times a day (TID) | ORAL | Status: DC
  Administered 2023-06-28 – 2023-07-12 (×42): 150 mg via ORAL
  Filled 2023-06-28 (×42): qty 3

## 2023-06-28 MED ORDER — METOPROLOL TARTRATE 25 MG PO TABS: 25.0000 mg | ORAL_TABLET | Freq: Two times a day (BID) | ORAL | Status: DC

## 2023-06-28 MED ORDER — GUAIFENESIN-DM 100-10 MG/5ML PO SYRP: 5.0000 mL | ORAL_SOLUTION | Freq: Four times a day (QID) | ORAL | Status: DC | PRN

## 2023-06-28 MED ORDER — BISACODYL 10 MG RE SUPP: 10.0000 mg | Freq: Every day | RECTAL | Status: DC | PRN

## 2023-06-28 MED ORDER — INSULIN GLARGINE-YFGN 100 UNIT/ML ~~LOC~~ SOLN
40.0000 [IU] | SUBCUTANEOUS | Status: DC
  Administered 2023-06-29 – 2023-07-05 (×7): 40 [IU] via SUBCUTANEOUS
  Filled 2023-06-28 (×7): qty 0.4

## 2023-06-28 MED ORDER — POTASSIUM CHLORIDE CRYS ER 20 MEQ PO TBCR
40.0000 meq | EXTENDED_RELEASE_TABLET | Freq: Every day | ORAL | Status: DC
  Administered 2023-06-29: 40 meq via ORAL
  Filled 2023-06-28: qty 2

## 2023-06-28 MED ORDER — ALUM & MAG HYDROXIDE-SIMETH 200-200-20 MG/5ML PO SUSP
30.0000 mL | ORAL | Status: DC | PRN
Start: 2023-06-28 — End: 2023-07-12
  Filled 2023-06-28: qty 30

## 2023-06-28 MED ORDER — LACOSAMIDE 50 MG PO TABS
150.0000 mg | ORAL_TABLET | Freq: Two times a day (BID) | ORAL | Status: DC
Start: 2023-06-28 — End: 2023-07-12
  Administered 2023-06-28 – 2023-07-12 (×28): 150 mg via ORAL
  Filled 2023-06-28 (×27): qty 3

## 2023-06-28 MED ORDER — SPIRONOLACTONE 25 MG PO TABS
25.0000 mg | ORAL_TABLET | Freq: Every day | ORAL | Status: DC
  Administered 2023-06-29 – 2023-06-30 (×2): 25 mg via ORAL
  Filled 2023-06-28 (×2): qty 1

## 2023-06-28 MED ORDER — PANTOPRAZOLE SODIUM 40 MG PO TBEC
40.0000 mg | DELAYED_RELEASE_TABLET | Freq: Every day | ORAL | Status: DC
  Administered 2023-06-29 – 2023-07-12 (×14): 40 mg via ORAL
  Filled 2023-06-28 (×14): qty 1

## 2023-06-28 MED ORDER — FENOFIBRATE 160 MG PO TABS
160.0000 mg | ORAL_TABLET | Freq: Every day | ORAL | Status: DC
  Administered 2023-06-29 – 2023-07-12 (×14): 160 mg via ORAL
  Filled 2023-06-28 (×16): qty 1

## 2023-06-28 MED ORDER — BUSPIRONE HCL 10 MG PO TABS
10.0000 mg | ORAL_TABLET | Freq: Three times a day (TID) | ORAL | Status: DC
Start: 2023-06-28 — End: 2023-07-12
  Administered 2023-06-28 – 2023-07-12 (×42): 10 mg via ORAL
  Filled 2023-06-28 (×42): qty 1

## 2023-06-28 MED ORDER — FLUTICASONE FUROATE-VILANTEROL 200-25 MCG/ACT IN AEPB
1.0000 | INHALATION_SPRAY | Freq: Every day | RESPIRATORY_TRACT | Status: DC
  Administered 2023-06-29 – 2023-07-12 (×11): 1 via RESPIRATORY_TRACT
  Filled 2023-06-28: qty 28

## 2023-06-28 MED ORDER — ICOSAPENT ETHYL 1 G PO CAPS
2.0000 g | ORAL_CAPSULE | Freq: Two times a day (BID) | ORAL | Status: DC
  Administered 2023-06-28 – 2023-07-12 (×28): 2 g via ORAL
  Filled 2023-06-28 (×29): qty 2

## 2023-06-28 MED ORDER — FLEET ENEMA RE ENEM: 1.0000 | ENEMA | Freq: Once | RECTAL | Status: DC | PRN

## 2023-06-28 MED ORDER — MELATONIN 5 MG PO TABS
5.0000 mg | ORAL_TABLET | Freq: Every evening | ORAL | Status: DC | PRN
  Administered 2023-06-28 – 2023-07-05 (×6): 5 mg via ORAL
  Filled 2023-06-28 (×7): qty 1

## 2023-06-28 MED ORDER — VITAMIN D 25 MCG (1000 UNIT) PO TABS
2000.0000 [IU] | ORAL_TABLET | Freq: Every day | ORAL | Status: DC
  Administered 2023-06-29 – 2023-07-12 (×14): 2000 [IU] via ORAL
  Filled 2023-06-28 (×14): qty 2

## 2023-06-28 MED ORDER — VENLAFAXINE HCL ER 75 MG PO CP24
225.0000 mg | ORAL_CAPSULE | Freq: Every day | ORAL | Status: DC
  Administered 2023-06-29 – 2023-07-12 (×14): 225 mg via ORAL
  Filled 2023-06-28 (×14): qty 1

## 2023-06-28 MED ORDER — POTASSIUM CHLORIDE CRYS ER 20 MEQ PO TBCR
40.0000 meq | EXTENDED_RELEASE_TABLET | Freq: Every day | ORAL | Status: DC
  Administered 2023-06-28: 40 meq via ORAL
  Filled 2023-06-28: qty 2

## 2023-06-28 MED ORDER — TORSEMIDE 20 MG PO TABS
120.0000 mg | ORAL_TABLET | Freq: Two times a day (BID) | ORAL | Status: DC
  Administered 2023-06-29 – 2023-07-03 (×9): 120 mg via ORAL
  Filled 2023-06-28 (×9): qty 6

## 2023-06-28 MED ORDER — APIXABAN 5 MG PO TABS
5.0000 mg | ORAL_TABLET | Freq: Two times a day (BID) | ORAL | Status: DC
  Administered 2023-06-28 – 2023-07-12 (×28): 5 mg via ORAL
  Filled 2023-06-28 (×28): qty 1

## 2023-06-28 MED ORDER — METOPROLOL TARTRATE 25 MG PO TABS: 12.5000 mg | ORAL_TABLET | Freq: Two times a day (BID) | ORAL | Status: DC

## 2023-06-28 MED ORDER — FERROUS SULFATE 325 (65 FE) MG PO TABS
325.0000 mg | ORAL_TABLET | Freq: Every day | ORAL | Status: DC
  Administered 2023-06-29 – 2023-07-12 (×14): 325 mg via ORAL
  Filled 2023-06-28 (×14): qty 1

## 2023-06-28 MED ORDER — ARIPIPRAZOLE 5 MG PO TABS
5.0000 mg | ORAL_TABLET | Freq: Every day | ORAL | Status: DC
  Administered 2023-06-29 – 2023-07-11 (×13): 5 mg via ORAL
  Filled 2023-06-28 (×14): qty 1

## 2023-06-28 MED ORDER — COLCHICINE 0.6 MG PO TABS: 0.3000 mg | ORAL_TABLET | Freq: Every day | ORAL | Status: DC

## 2023-06-28 MED ORDER — DIPHENHYDRAMINE HCL 25 MG PO CAPS
25.0000 mg | ORAL_CAPSULE | Freq: Four times a day (QID) | ORAL | Status: DC | PRN
  Administered 2023-07-01 – 2023-07-06 (×3): 25 mg via ORAL
  Filled 2023-06-28 (×3): qty 1

## 2023-06-28 MED ORDER — ROSUVASTATIN CALCIUM 20 MG PO TABS: 20.0000 mg | ORAL_TABLET | Freq: Every day | ORAL | Status: DC

## 2023-06-28 MED ORDER — COLCHICINE 0.3 MG HALF TABLET
0.3000 mg | ORAL_TABLET | Freq: Two times a day (BID) | ORAL | Status: DC
  Administered 2023-06-28 – 2023-06-29 (×2): 0.3 mg via ORAL
  Filled 2023-06-28 (×3): qty 1

## 2023-06-28 NOTE — Progress Notes (Signed)
Mobility Specialist Progress Note;   06/28/23 1320  Mobility  Activity Transferred from chair to bed  Level of Assistance Moderate assist, patient does 50-74%  Assistive Device Front wheel walker  Activity Response Tolerated well  Mobility Referral Yes  $Mobility charge 1 Mobility  Mobility Specialist Start Time (ACUTE ONLY) 1320  Mobility Specialist Stop Time (ACUTE ONLY) 1330  Mobility Specialist Time Calculation (min) (ACUTE ONLY) 10 min   Pt requesting assistance back to bed. Required ModA for transfer from chair to bed. No c/o during session. VSS throughout. Pt left in bed with all needs met. Alarm on.   Caesar Bookman Mobility Specialist Please contact via SecureChat or Rehab Office [phone]

## 2023-06-28 NOTE — Discharge Summary (Addendum)
Lawrence [name] NWG:[id number] DOB: [birth year] DOA: 06/24/2023  PCP: [name], PA-C  Admit date: 06/24/2023  Discharge date: 06/28/2023  Admitted From: Home   Disposition:  CIR/SNF   Recommendations for Outpatient Follow-up:   Follow up with PCP in 1-2 weeks  PCP Please obtain BMP/CBC, 2 view CXR in 1week,  (see Discharge instructions)   PCP Please follow up on the following pending results:    Home Health: None   Equipment/Devices: None  Consultations: NEuro, Cards Discharge Condition: Stable    CODE STATUS: Full    Diet Recommendation: Heart Healthy Low Carb.  1.5 L fluid restriction per day.  Check CBGs q. ACH S.    Chief Complaint  Patient presents with   Weakness     Brief history of present illness from the day of admission and additional interim summary    67 y.o. male with atrial fibrillation on anticoagulation, prior CVA with residual left-sided weakness, type 2 diabetes with neuropathy, seizure disorder on antiepileptics, CAD, COPD, OSA, history of severe aortic stenosis status post TAVR who presented to the ER on 11/16 via EMS as a code stroke.  Reportedly patient's family found him unresponsive in his recliner.  On arrival to the ER patient was hemodynamically stable with mild AKI.  CT head/CTA head and neck were largely within normal limits.  Neurology was consulted.  Brain MRI revealed 5 mm acute ischemic nonhemorrhagic left cerebellar infarct.  Patient was admitted to [facility] on 11/17 and diagnosed with another acute stroke.                                                                  Hospital Course    Acute CVA MRI with 5 mm acute ischemic left cerebellar infarct -  second such stroke in the last few months, MRI noted, CTA noted, nonacute echo, neurology on board.  Continue  Eliquis, statin dose adjusted for better LDL control, A1c stable, PT OT, may require SNF.  Discussed with stroke team Dr. Pearlean Brownie on 06/26/2023, the present stroke is silent as it does not correlate with his deficit of left lower extremity weakness, this deficit is chronic present on admission from previous stroke.   History of CVA At baseline patient does have some left lower extremity weakness though he was previously ambulatory with a rollator,  Home meds as above.  Continue PT OT may require CIR/SNF.  Stable for discharge.   Acute respiratory failure - Patient not on home O2,  Chest x-ray without acute airspace disease, Suspect current etiology is due to obesity hypoventilation syndrome with acute deconditioning given current CVA, Continue supplemental O2, wean as tolerated in daytime, nighttime CPAP, outpatient pulmonary follow-up and sleep study.   Chr. Atrial fibrillation.  [place] vas 2 score of greater than 5.  Continue Eliquis,  rate in good control, dose beta-blocker gradually uptitrate as tolerated by blood pressure.    Questionable V. tach versus torsades.  Seen by cardiology likely artifact, monitor on telemetry, patient was asymptomatic, EF is about 50%, monitor electrolytes.  B.Blocker as tolerated.   OSA  - CPAP at night   Chronic bilateral lower extremity venous stasis ulcers.  Wound care, resumed diuretics and fluid restriction.   Acute metabolic encephalopathy  - Present on arrival, now resolved to baseline, likely metabolic encephalopathy.   Chronic diastolic heart failure EF 60% on recent echocardiogram- Home med spironolactone, torsemide on hold given low BP and desires for permissive hypertension as above,  Continue Eliquis, statin, fenofibrate, on home diuretics.  Seen by [facility] cardiology this admission.   COPD  - Continue Trelegy, Flonase, Zyrtec   CKD 3A.  Baseline creatinine anywhere between 1 and 1.4, monitor on diuretics.     Hypokalemia - replaced   Depression  -  Continue all home meds   Hypertension  -low-dose beta-blocker resumed, gradually titrate up beta-blocker over the next 3 to 4 days baseline beta-blocker dose of 50 mg.   Gout flare  - Right great toe gout flare, colchicine, hold allopurinol as he has acute flare, overall much improved.  Colchicine for 5 more days, resume allopurinol in 2-3 weeks.   BMI 47  - Outpatient follow up for lifestyle modification and risk factor management   Type 2 diabetes with peripheral neuropathy -long-acting insulin along with sliding scale, continue home regimen.  Check CBGs q. ACH S adjust as needed.  Discharge diagnosis     Principal Problem:   Acute CVA (cerebrovascular accident) (HCC) Active Problems:   Chronic diastolic CHF (congestive heart failure) (HCC)   Essential hypertension   COPD (chronic obstructive pulmonary disease) (HCC)   Hemiparesis affecting left side as late effect of cerebrovascular accident (CVA) (HCC)   Gout attack   Coronary artery calcification seen on CAT scan   Depression   CVA (cerebral vascular accident) (HCC)   Atrial fibrillation, chronic [facility])    Discharge instructions    Discharge Instructions     Ambulatory Referral for Lung Cancer Scre   Complete by: As directed    Discharge instructions   Complete by: As directed    Follow with Primary MD [name], PA-C in 7 days   Get CBC, CMP, 2 view Chest X ray -  checked next visit with your primary MD or CIR/SNF MD   Activity: As tolerated with Full fall precautions use walker/cane & assistance as needed  Disposition CIR/SNF  Diet: Heart Healthy Low Carb.  1.5 L fluid restriction per day.  Check CBGs q. ACH S.  Special Instructions: If you have smoked or chewed Tobacco  in the last 2 yrs please stop smoking, stop any regular Alcohol  and or any Recreational drug use.  On your next visit with your primary care physician please Get Medicines reviewed and adjusted.  Please request your Prim.MD to go over  all Hospital Tests and Procedure/Radiological results at the follow up, please get all Hospital records sent to your Prim MD by signing hospital release before you go home.  If you experience worsening of your admission symptoms, develop shortness of breath, life threatening emergency, suicidal or homicidal thoughts you must seek medical attention immediately by calling 911 or calling your MD immediately  if symptoms less severe.  You Must read complete instructions/literature along with all the possible adverse reactions/side effects for all the Medicines you take  and that have been prescribed to you. Take any new Medicines after you have completely understood and accpet all the possible adverse reactions/side effects.   Do not drive when taking Pain medications.  Do not take more than prescribed Pain, Sleep and Anxiety Medications   Discharge wound care:   Complete by: As directed    1. Cleanse L leg ulcers with NS, apply silver hydrofiber (Aquacel [place] [phone]) to wound beds daily, cover with ABD pad and wrap with Kerlix roll gauze beginning just above toes and ending right below knee.  Secure with Ace bandage wrapped in same fashion as Kerlix for light compression.    2.  Clean R lower leg with NS, apply Xeroform gauze [place] [phone]) to any scabbed areas, cover with dry gauze and secure with Kerlix roll gauze wrapped from just above toes to right below knee.   Increase activity slowly   Complete by: As directed        Discharge Medications   Allergies as of 06/28/2023       Reactions   Vancomycin Other (See Comments)   "Red Man Syndrome" 02/02/17: possible cause for rash under both arms   Niacin And Related Other (See Comments)   Red man syndrome   Tubersol [tuberculin, Ppd] Other (See Comments)   Reaction unknown   Doxycycline Rash, Other (See Comments)        Medication List     STOP taking these medications    allopurinol 100 MG tablet Commonly known as: ZYLOPRIM    atorvastatin 40 MG tablet Commonly known as: LIPITOR   metoprolol succinate 50 MG 24 hr tablet Commonly known as: TOPROL-XL       TAKE these medications    acetaminophen 650 MG CR tablet Commonly known as: TYLENOL Take 1,300 mg by mouth every 8 (eight) hours as needed for pain.   albuterol 108 (90 Base) MCG/ACT inhaler Commonly known as: VENTOLIN HFA Inhale 2 puffs into the lungs every 6 (six) hours as needed for wheezing or shortness of breath.   apixaban 5 MG Tabs tablet Commonly known as: Eliquis Take 1 tablet (5 mg total) by mouth 2 (two) times daily.   ARIPiprazole 5 MG tablet Commonly known as: ABILIFY Take 1 tablet (5 mg total) by mouth at bedtime.   busPIRone 10 MG tablet Commonly known as: BUSPAR Take 1 tablet (10 mg total) by mouth 3 (three) times daily.   cetirizine 10 MG tablet Commonly known as: ZYRTEC Take 10 mg by mouth at bedtime.   colchicine 0.6 MG tablet Take 0.5 tablets (0.3 mg total) by mouth daily for 5 doses.   cyanocobalamin 1000 MCG tablet Commonly known as: VITAMIN B12 Take 1,000 mcg by mouth daily.   dapagliflozin propanediol 10 MG Tabs tablet Commonly known as: Farxiga TAKE ONE TABLET BY MOUTH BEFORE BREAKFAST   fenofibrate 145 MG tablet Commonly known as: Tricor Take 1 tablet (145 mg total) by mouth daily.   ferrous sulfate 324 (65 Fe) MG Tbec Take 324 mg by mouth daily with breakfast.   fluticasone 50 MCG/ACT nasal spray Commonly known as: FLONASE Place 2 sprays into both nostrils daily.   icosapent Ethyl 1 g capsule Commonly known as: Vascepa Take 2 capsules (2 g total) by mouth 2 (two) times daily.   Lacosamide 150 MG Tabs Take 1 tablet (150 mg total) by mouth 2 (two) times daily.   levETIRAcetam 500 MG tablet Commonly known as: KEPPRA Take 1 tablet (500 mg total) by mouth 2 (  two) times daily.   metFORMIN 500 MG 24 hr tablet Commonly known as: GLUCOPHAGE-XR Take 4 tablets (2,000 mg total) by mouth daily. What  changed: when to take this   metoprolol tartrate 25 MG tablet Commonly known as: LOPRESSOR Take 0.5 tablets (12.5 mg total) by mouth 2 (two) times daily.   mirtazapine 15 MG tablet Commonly known as: REMERON Take 1 tablet (15 mg total) by mouth at bedtime.   Mounjaro 15 MG/0.5ML Pen Generic drug: tirzepatide INJECT 15 MG INTO THE SKIN ONCE A WEEK. What changed: See the new instructions.   NovoLOG FlexPen 100 UNIT/ML FlexPen Generic drug: insulin aspart Before each meal 3 times a day, 140-199 - 2 units, 200-250 - 4 units, 251-299 - 6 units,  300-349 - 8 units,  350 or above 10 units. What changed: additional instructions   omeprazole 20 MG capsule Commonly known as: PRILOSEC Take 20 mg by mouth every morning.   potassium chloride SA 20 MEQ tablet Commonly known as: KLOR-CON M TAKE (2 TABLETS) IN THE MORNING, TAKE (1 TABLET) IN THE EVENING   prazosin 2 MG capsule Commonly known as: MINIPRESS Take 2 capsules (4 mg total) by mouth at bedtime.   pregabalin 150 MG capsule Commonly known as: LYRICA Take 1 capsule (150 mg total) by mouth 3 (three) times daily.   repaglinide 2 MG tablet Commonly known as: PRANDIN TAKE TWO TABLETS BY MOUTH TWICE DAILY BEFORE A meal What changed: See the new instructions.   rosuvastatin 20 MG tablet Commonly known as: CRESTOR Take 1 tablet (20 mg total) by mouth daily.   spironolactone 25 MG tablet Commonly known as: ALDACTONE Take 1 tablet (25 mg total) by mouth daily.   torsemide 20 MG tablet Commonly known as: DEMADEX Take 6 tablets (120 mg total) by mouth in the morning and at bedtime.   traZODone 100 MG tablet Commonly known as: DESYREL TAKE 2 TABLETS BY MOUTH AT BEDTIME.   Trelegy Ellipta 200-62.5-25 MCG/ACT Aepb Generic drug: Fluticasone-Umeclidin-Vilant INHALE 1 PUFF BY MOUTH INTO LUNGS ONCE DAILY   Tresiba FlexTouch 100 UNIT/ML FlexTouch Pen Generic drug: insulin degludec INJECT 40 UNITS INTO THE SKIN ONCE  DAILY   venlafaxine XR 75 MG 24 hr capsule Commonly known as: EFFEXOR-XR Take 3 capsules (225 mg total) by mouth daily with breakfast.   VITAMIN D3 PO Take 2,000 Units by mouth daily.               Discharge Care Instructions  (From admission, onward)           Start     Ordered   06/28/23 0000  Discharge wound care:       Comments: 1. Cleanse L leg ulcers with NS, apply silver hydrofiber (Aquacel [place] [phone]) to wound beds daily, cover with ABD pad and wrap with Kerlix roll gauze beginning just above toes and ending right below knee.  Secure with Ace bandage wrapped in same fashion as Kerlix for light compression.    2.  Clean R lower leg with NS, apply Xeroform gauze [place] [phone]) to any scabbed areas, cover with dry gauze and secure with Kerlix roll gauze wrapped from just above toes to right below knee.   06/28/23 0909              Major procedures and Radiology Reports - PLEASE review detailed and final reports thoroughly  -     ECHOCARDIOGRAM COMPLETE BUBBLE STUDY  Addendum Date: 06/26/2023   Status post 34  mm TAVR Medtronic evolute FX valve (02/07/2023), at the aortic position, grossly well-seated, no perivalvular leak, no valvular regurgitation, either mild valvular stenosis versus prosthesis patient mismatch (peak velocity 2.7 m/s, peak gradient 29 mmHg, mean gradient 16 mmHg, dimensional index 0.44, EOA 1.11 cm, BSA 2.8 m2, acceleration time 82 ms. Clinical correlation required. [employer] Electronically Amended 06/26/2023, 5:34 PM   Final (Amended)    Result Date: 06/26/2023    ECHOCARDIOGRAM REPORT   Patient Name:   KEIF KIERAN Date of Exam: 06/26/2023 Medical Rec #:  [id number]        Height:       74.0 in Accession #:    [id number]       Weight:       373.0 lb Date of Birth:  [birth year]        BSA:          2.833 m Patient Age:    67 years         BP:           112/74 mmHg Patient Gender: M                HR:           79 bpm. Exam Location:   Inpatient Procedure: 2D Echo, Cardiac Doppler, Color Doppler and Saline Contrast Bubble            Study Indications:    Stroke I63.9  History:        Patient has prior history of Echocardiogram examinations, most                 recent 03/10/2023. CHF, CAD, Stroke and COPD, Arrythmias:Atrial                 Fibrillation; Risk Factors:Hypertension, Dyslipidemia and Former                 Smoker.  Sonographer:    Dondra Prader RVT RCS Referring Phys: [phone] DEBBY CROSLEY  Sonographer Comments: Technically difficult study due to poor echo windows, suboptimal apical window and suboptimal subcostal window. Image acquisition challenging due to COPD and Image acquisition challenging due to patient body habitus. IMPRESSIONS  1. Technically difficult study.  2. Left ventricular ejection fraction, by estimation, is 50 to 55%. The left ventricle has low normal function. Left ventricular endocardial border not optimally defined to evaluate regional wall motion. The left ventricular internal cavity size was dilated. Left ventricular diastolic function could not be evaluated.  3. Right ventricular systolic function is normal. The right ventricular size is normal. Mildly increased right ventricular wall thickness.  4. There is no evidence of cardiac tamponade.  5. The mitral valve is grossly normal. No evidence of mitral valve regurgitation. No evidence of mitral stenosis.  6. The aortic valve was not well visualized. Aortic valve regurgitation is not visualized. But based on doppler parameter cannot rule out mild (per max velocity and MG) to moderate aortic stenosis (dimensional index).  7. Aortic ascending aorta is structurally normal, with no evidence of dilitation.  8. Agitated saline contrast bubble study was inconclusive. Conclusion(s)/Recommendation(s): No intracardiac source of embolism detected on this transthoracic study. Consider a transesophageal echocardiogram to exclude cardiac source of embolism if clinically indicated.  FINDINGS  Left Ventricle: Left ventricular ejection fraction, by estimation, is 50 to 55%. The left ventricle has low normal function. Left ventricular endocardial border not optimally defined to evaluate regional wall motion. The left ventricular internal cavity  size was  dilated. There is borderline left ventricular hypertrophy. Left ventricular diastolic function could not be evaluated. Right Ventricle: The right ventricular size is normal. Mildly increased right ventricular wall thickness. Right ventricular systolic function is normal. Left Atrium: Left atrial size was normal in size. Right Atrium: Right atrial size was normal in size. Pericardium: Trivial pericardial effusion is present. There is no evidence of cardiac tamponade. Mitral Valve: The mitral valve is grossly normal. No evidence of mitral valve regurgitation. No evidence of mitral valve stenosis. Tricuspid Valve: The tricuspid valve is grossly normal. Tricuspid valve regurgitation is not demonstrated. No evidence of tricuspid stenosis. Aortic Valve: The aortic valve was not well visualized. Aortic valve regurgitation is not visualized. But based on doppler parameter cannot rule out mild (per max velocity and MG) to moderate aortic stenosis (dimensional index). Aortic valve mean gradient measures 15.7 mmHg. Aortic valve peak gradient measures 29.4 mmHg. Aortic valve area, by VTI measures 1.11 cm. Pulmonic Valve: The pulmonic valve was not well visualized. Pulmonic valve regurgitation is not visualized. No evidence of pulmonic stenosis. Aorta: Aortic root could not be assessed and ascending aorta is structurally normal, with no evidence of dilitation. IAS/Shunts: The interatrial septum was not assessed. Agitated saline contrast was given intravenously to evaluate for intracardiac shunting. Agitated saline contrast bubble study was inconclusive.  LEFT VENTRICLE PLAX 2D LVIDd:         6.30 cm LVIDs:         4.60 cm LV PW:         1.30 cm LV IVS:         1.10 cm LVOT diam:     1.80 cm LV SV:         58 LV SV Index:   21 LVOT Area:     2.54 cm  RIGHT VENTRICLE             IVC RV S prime:     16.20 cm/s  IVC diam: 3.30 cm LEFT ATRIUM             Index        RIGHT ATRIUM           Index LA diam:        4.80 cm 1.69 cm/m   RA Area:     17.10 cm LA Vol (A2C):   62.1 ml 21.92 ml/m  RA Volume:   43.60 ml  15.39 ml/m LA Vol (A4C):   77.2 ml 27.25 ml/m LA Biplane Vol: 70.8 ml 24.99 ml/m  AORTIC VALVE                     PULMONIC VALVE AV Area (Vmax):    1.01 cm      PV Vmax:       1.23 m/s AV Area (Vmean):   1.09 cm      PV Peak grad:  6.1 mmHg AV Area (VTI):     1.11 cm AV Vmax:           271.33 cm/s AV Vmean:          181.000 cm/s AV VTI:            0.524 m AV Peak Grad:      29.4 mmHg AV Mean Grad:      15.7 mmHg LVOT Vmax:         107.42 cm/s LVOT Vmean:        77.260 cm/s LVOT VTI:  0.229 m LVOT/AV VTI ratio: 0.44  AORTA Ao Asc diam: 3.80 cm MITRAL VALVE MV Area (PHT): 4.39 cm    SHUNTS MV Decel Time: 173 msec    Systemic VTI:  0.23 m MV E velocity: 85.60 cm/s  Systemic Diam: 1.80 cm MV A velocity: 78.00 cm/s MV E/A ratio:  1.10 [employer] Electronically signed by Tessa Lerner Signature Date/Time: 06/26/2023/11:37:34 AM    Final (Amended)    MR BRAIN WO CONTRAST  Result Date: 06/25/2023 CLINICAL DATA:  Initial evaluation for neuro deficit, stroke suspected. EXAM: MRI HEAD WITHOUT CONTRAST TECHNIQUE: Multiplanar, multiecho pulse sequences of the brain and surrounding structures were obtained without intravenous contrast. COMPARISON:  Prior CTs from 06/24/2023. FINDINGS: Brain: Mild age-related cerebral volume loss. Few scattered subcentimeter foci of T2/FLAIR signal intensity seen involving the supratentorial cerebral white matter, most like related chronic microvascular ischemic disease, less than is typically seen for patient age. 5 mm focus of restricted diffusion seen involving the left cerebellum (series 5, image 57), consistent with a small  acute ischemic nonhemorrhagic infarct. No other evidence for acute or subacute ischemia elsewhere. No acute or chronic intracranial blood products. No mass lesion, midline shift or mass effect. No hydrocephalus or extra-axial fluid collection. Pituitary gland suprasellar region within normal limits. Vascular: Major intracranial vascular flow voids are maintained. Skull and upper cervical spine: Craniocervical junction within normal limits. Bone marrow signal intensity normal. No scalp soft tissue abnormality. Sinuses/Orbits: Globes and orbital soft tissues within normal limits. Mild mucosal thickening present about the sphenoid ethmoidal sinuses. No mastoid effusion. Other: None. IMPRESSION: 1. 5 mm acute ischemic nonhemorrhagic left cerebellar infarct. 2. Underlying mild age-related cerebral volume loss with mild chronic microvascular ischemic disease. Electronically Signed   By: Rise Mu M.D.   On: 06/25/2023 01:14   DG Chest Portable 1 View  Result Date: 06/24/2023 CLINICAL DATA:  Altered level of consciousness EXAM: PORTABLE CHEST 1 VIEW COMPARISON:  05/15/2023 FINDINGS: Single frontal view of the chest demonstrates a stable cardiac silhouette. Chronic scarring at the lung bases. No acute airspace disease, effusion, or pneumothorax. No acute bony abnormalities. IMPRESSION: 1. Chronic bibasilar scarring.  No acute airspace disease. Electronically Signed   By: Sharlet Salina M.D.   On: 06/24/2023 22:52   CT ANGIO HEAD NECK W WO CM  Result Date: 06/24/2023 CLINICAL DATA:  Stroke/TIA. Determine embolic source. Left-sided facial droop. EXAM: CT ANGIOGRAPHY HEAD AND NECK WITH AND WITHOUT CONTRAST TECHNIQUE: Multidetector CT imaging of the head and neck was performed using the standard protocol during bolus administration of intravenous contrast. Multiplanar CT image reconstructions and MIPs were obtained to evaluate the vascular anatomy. Carotid stenosis measurements (when applicable) are  obtained utilizing NASCET criteria, using the distal internal carotid diameter as the denominator. RADIATION DOSE REDUCTION: This exam was performed according to the departmental dose-optimization program which includes automated exposure control, adjustment of the mA and/or kV according to patient size and/or use of iterative reconstruction technique. CONTRAST:  75mL OMNIPAQUE IOHEXOL 350 MG/ML SOLN COMPARISON:  CT head without contrast 06/24/2023. CT angio head and neck 05/07/2023. FINDINGS: CTA NECK FINDINGS Aortic arch: Atherosclerotic calcifications are present at the aortic arch without significant stenosis relative to the distal vessel. A scratched at the left common carotid artery shares a common origin with the innominate artery. Right carotid system: The right common carotid artery is within normal limits. Atherosclerotic calcifications are present at the bifurcation without significant stenosis. Mild tortuosity is present in the cervical left ICA. Left carotid system:  The left common carotid artery is within normal limits. Atherosclerotic calcifications are present at the bifurcation. No significant stenosis is present. Mild tortuosity is present the cervical left ICA. Vertebral arteries: The left vertebral artery is the dominant vessel. Both vertebral arteries originate from the subclavian arteries without significant stenosis. Significant stenosis is present either vertebral artery in. Skeleton: Neck level degenerative changes are again noted in the cervical spine. No focal osseous lesions are present. Other neck: Soft tissues the neck are otherwise unremarkable. Salivary glands are within normal limits. Thyroid is normal. No significant adenopathy is present. No focal mucosal or submucosal lesions are present. Upper chest: Mild dependent atelectasis is present in the lungs bilaterally. Review of the MIP images confirms the above findings CTA HEAD FINDINGS Anterior circulation: The internal carotid  arteries are within normal limits from the skull base to the ICA termini. The A1 and M1 segments are normal. The anterior communicating artery is patent. MCA bifurcations are normal. The ACA and MCA branch vessels are within normal limits. No aneurysm is present. Posterior circulation: Left vertebral artery is dominant vessel. The PICA origins are visualized and normal. The vertebrobasilar junction and basilar artery are normal. The superior cerebellar arteries are patent. Both posterior cerebral arteries originate from basilar tip. PCA branch vessels are normal bilaterally. Venous sinuses: The dural sinuses are patent. The straight sinus and deep cerebral veins are intact. Cortical veins are within normal limits. No significant vascular malformation is evident. Anatomic variants: None Review of the MIP images confirms the above findings IMPRESSION: 1. No emergent large vessel occlusion. 2. Atherosclerotic changes at the carotid bifurcations bilaterally without significant stenosis. 3. Mild tortuosity of the cervical internal carotid arteries bilaterally likely reflects chronic hypertension. 4. Degenerative changes in the cervical spine. 5.  Aortic Atherosclerosis (ICD10-I70.0). Electronically Signed   By: Marin Roberts M.D.   On: 06/24/2023 21:33   CT HEAD CODE STROKE WO CONTRAST  Result Date: 06/24/2023 CLINICAL DATA:  Code stroke.  Initial evaluation for acute stroke. EXAM: CT HEAD WITHOUT CONTRAST TECHNIQUE: Contiguous axial images were obtained from the base of the skull through the vertex without intravenous contrast. RADIATION DOSE REDUCTION: This exam was performed according to the departmental dose-optimization program which includes automated exposure control, adjustment of the mA and/or kV according to patient size and/or use of iterative reconstruction technique. COMPARISON:  Prior study from 05/07/2023 FINDINGS: Brain: Cerebral volume within normal limits. Mild chronic microvascular ischemic  disease. No acute intracranial hemorrhage. No acute large vessel territory infarct. No mass lesion or midline shift. No hydrocephalus or extra-axial fluid collection. Vascular: No abnormal hyperdense vessel. Skull: Scalp soft tissues within normal limits.  Calvarium intact. Sinuses/Orbits: Globes and orbital soft tissues within normal limits. Mild mucosal thickening about the ethmoidal air cells. Paranasal sinuses are otherwise clear. No significant mastoid effusion. Other: None. ASPECTS [facility] Stroke Program Early CT Score) - Ganglionic level infarction (caudate, lentiform nuclei, internal capsule, insula, M1-M3 cortex): 7 - Supraganglionic infarction (M4-M6 cortex): 3 Total score (0-10 with 10 being normal): 10 IMPRESSION: 1. No acute intracranial abnormality. 2. Aspects is 10. 3. Mild chronic microvascular ischemic disease. These results were communicated to Dr. Wilford Corner at 9:14 pm on 06/24/2023 by text page via the [facility] messaging system. Electronically Signed   By: Rise Mu M.D.   On: 06/24/2023 21:15    Micro Results    No results found for this or any previous visit (from the past 240 hour(s)).  Today   Subjective    Marcy Salvo  Musil today has no headache,no chest abdominal pain,no new weakness tingling or numbness, feels much better wants to go home today.    Objective   Blood pressure 126/68, pulse 62, temperature 97.6 F (36.4 C), temperature source Oral, resp. rate 13, height 6\' 2"  (1.88 m), weight (!) 169.2 kg, SpO2 (!) 86%.   Intake/Output Summary (Last 24 hours) at 06/28/2023 0912 Last data filed at 06/28/2023 0834 Gross per 24 hour  Intake 240 ml  Output 5075 ml  Net -4835 ml    Exam  Awake Alert, No new F.N deficits,    [place].AT,PERRAL Supple Neck,   Symmetrical Chest wall movement, Good air movement bilaterally, CTAB RRR,No Gallops,   +ve B.Sounds, Abd Soft, Non tender,  No Cyanosis, Clubbing or edema    Data Review   Recent Labs  Lab 06/24/23 2109  06/24/23 2155 06/25/23 0426 06/26/23 0405 06/27/23 0410 06/28/23 0551  WBC 9.0  --  9.9 9.3 10.7* 10.4  HGB 14.0 13.3 12.9* 13.1 12.3* 12.8*  HCT 43.8 39.0 40.6 40.7 38.7* 40.1  PLT 265  --  263 277 264 291  MCV 90.5  --  91.4 90.6 90.2 88.7  MCH 28.9  --  29.1 29.2 28.7 28.3  MCHC 32.0  --  31.8 32.2 31.8 31.9  RDW 17.3*  --  17.2* 17.2* 17.0* 16.4*  LYMPHSABS 1.8  --   --  1.6 1.7 2.1  MONOABS 0.8  --   --  0.7 0.8 0.8  EOSABS 0.5  --   --  0.4 0.5 0.4  BASOSABS 0.1  --   --  0.0 0.1 0.0    Recent Labs  Lab 06/24/23 2109 06/24/23 2155 06/24/23 2328 06/25/23 0426 06/26/23 0403 06/26/23 0405 06/27/23 0410 06/28/23 0551  NA 139 140  --  140  --  139 138 139  K 4.3 3.1*  --  3.2*  --  3.2* 3.2* 3.6  CL 102  --   --  106  --  101 99 101  CO2 25  --   --  26  --  29 31 29   ANIONGAP 12  --   --  8  --  9 8 9   GLUCOSE 82  --   --  109*  --  101* 139* 91  BUN 29*  --   --  30*  --  27* 23 23  CREATININE 1.18  --   --  1.14  --  1.29* 1.37* 1.04  AST 30  --   --  15  --  13* 18 16  ALT 23  --   --  18  --  17 17 16   ALKPHOS 56  --   --  45  --  41 42 44  BILITOT 1.0  --   --  0.4  --  0.4 0.4 0.7  ALBUMIN 3.7  --   --  3.3*  --  3.3* 3.2* 3.4*  INR 1.0  --   --   --   --   --   --   --   TSH  --   --  1.561  --   --   --   --   --   HGBA1C  --   --   --   --  6.5*  --   --   --   MG  --   --   --   --   --  2.3 2.5* 2.5*  CALCIUM  9.6  --   --  8.9  --  8.4* 8.4* 8.7*    Total Time in preparing paper work, data evaluation and todays exam - 35 minutes  Signature  -    Susa Raring M.D on 06/28/2023 at 9:12 AM   -  To page go to [email or web address]

## 2023-06-28 NOTE — Plan of Care (Signed)
Problem: Education: Goal: Ability to describe self-care measures that may prevent or decrease complications (Diabetes Survival Skills Education) will improve Outcome: Progressing Goal: Individualized Educational Video(s) Outcome: Progressing   Problem: Coping: Goal: Ability to adjust to condition or change in health will improve Outcome: Progressing   Problem: Fluid Volume: Goal: Ability to maintain a balanced intake and output will improve Outcome: Progressing   Problem: Health Behavior/Discharge Planning: Goal: Ability to identify and utilize available resources and services will improve Outcome: Progressing Goal: Ability to manage health-related needs will improve Outcome: Progressing   Problem: Metabolic: Goal: Ability to maintain appropriate glucose levels will improve Outcome: Progressing   Problem: Nutritional: Goal: Maintenance of adequate nutrition will improve Outcome: Progressing Goal: Progress toward achieving an optimal weight will improve Outcome: Progressing   Problem: Skin Integrity: Goal: Risk for impaired skin integrity will decrease Outcome: Progressing   Problem: Tissue Perfusion: Goal: Adequacy of tissue perfusion will improve Outcome: Progressing   Problem: Education: Goal: Knowledge of disease or condition will improve Outcome: Progressing Goal: Knowledge of secondary prevention will improve (MUST DOCUMENT ALL) Outcome: Progressing Goal: Knowledge of patient specific risk factors will improve Loraine Leriche N/A or DELETE if not current risk factor) Outcome: Progressing   Problem: Ischemic Stroke/TIA Tissue Perfusion: Goal: Complications of ischemic stroke/TIA will be minimized Outcome: Progressing   Problem: Coping: Goal: Will verbalize positive feelings about self Outcome: Progressing Goal: Will identify appropriate support needs Outcome: Progressing   Problem: Health Behavior/Discharge Planning: Goal: Ability to manage health-related needs  will improve Outcome: Progressing Goal: Goals will be collaboratively established with patient/family Outcome: Progressing   Problem: Self-Care: Goal: Ability to participate in self-care as condition permits will improve Outcome: Progressing Goal: Verbalization of feelings and concerns over difficulty with self-care will improve Outcome: Progressing Goal: Ability to communicate needs accurately will improve Outcome: Progressing   Problem: Nutrition: Goal: Risk of aspiration will decrease Outcome: Progressing Goal: Dietary intake will improve Outcome: Progressing   Problem: Education: Goal: Knowledge of General Education information will improve Description: Including pain rating scale, medication(s)/side effects and non-pharmacologic comfort measures Outcome: Progressing   Problem: Health Behavior/Discharge Planning: Goal: Ability to manage health-related needs will improve Outcome: Progressing   Problem: Clinical Measurements: Goal: Ability to maintain clinical measurements within normal limits will improve Outcome: Progressing Goal: Will remain free from infection Outcome: Progressing Goal: Diagnostic test results will improve Outcome: Progressing Goal: Respiratory complications will improve Outcome: Progressing Goal: Cardiovascular complication will be avoided Outcome: Progressing   Problem: Activity: Goal: Risk for activity intolerance will decrease Outcome: Progressing   Problem: Education: Goal: Knowledge of disease or condition will improve Outcome: Progressing Goal: Knowledge of secondary prevention will improve (MUST DOCUMENT ALL) Outcome: Progressing Goal: Knowledge of patient specific risk factors will improve Loraine Leriche N/A or DELETE if not current risk factor) Outcome: Progressing   Problem: Ischemic Stroke/TIA Tissue Perfusion: Goal: Complications of ischemic stroke/TIA will be minimized Outcome: Progressing   Problem: Coping: Goal: Will verbalize  positive feelings about self Outcome: Progressing Goal: Will identify appropriate support needs Outcome: Progressing   Problem: Health Behavior/Discharge Planning: Goal: Ability to manage health-related needs will improve Outcome: Progressing Goal: Goals will be collaboratively established with patient/family Outcome: Progressing   Problem: Self-Care: Goal: Ability to participate in self-care as condition permits will improve Outcome: Progressing Goal: Verbalization of feelings and concerns over difficulty with self-care will improve Outcome: Progressing Goal: Ability to communicate needs accurately will improve Outcome: Progressing   Problem: Nutrition: Goal: Risk of aspiration will decrease Outcome: Progressing  Goal: Dietary intake will improve Outcome: Progressing   Problem: Education: Goal: Knowledge of patient specific risk factors will improve Loraine Leriche N/A or DELETE if not current risk factor) Outcome: Progressing   Problem: Ischemic Stroke/TIA Tissue Perfusion: Goal: Complications of ischemic stroke/TIA will be minimized Outcome: Progressing   Problem: Coping: Goal: Will verbalize positive feelings about self Outcome: Progressing Goal: Will identify appropriate support needs Outcome: Progressing

## 2023-06-28 NOTE — Progress Notes (Signed)
Rounding Note    Patient Name: HAWARD BIESINGER Date of Encounter: 06/28/2023  [facility] Cardiologist: Armanda Magic, MD / CHF service  Subjective   Pt a little SOB when I saw him   Still on O2   Inpatient Medications    Scheduled Meds:  apixaban  5 mg Oral BID   ARIPiprazole  5 mg Oral Daily   busPIRone  10 mg Oral TID   cholecalciferol  2,000 Units Oral Daily   colchicine  0.3 mg Oral BID   cyanocobalamin  1,000 mcg Oral Daily   fenofibrate  160 mg Oral Daily   ferrous sulfate  325 mg Oral Q breakfast   fluticasone furoate-vilanterol  1 puff Inhalation Daily   And   umeclidinium bromide  1 puff Inhalation Daily   icosapent Ethyl  2 g Oral BID   insulin aspart  0-15 Units Subcutaneous TID WC   insulin aspart  0-5 Units Subcutaneous QHS   insulin aspart  12 Units Subcutaneous TID WC   insulin glargine-yfgn  40 Units Subcutaneous Q24H   lacosamide  150 mg Oral BID   levETIRAcetam  500 mg Oral BID   metoprolol tartrate  12.5 mg Oral BID   pantoprazole  40 mg Oral Daily   potassium chloride  40 mEq Oral Daily   pregabalin  150 mg Oral TID   rosuvastatin  20 mg Oral Daily   spironolactone  25 mg Oral Daily   torsemide  120 mg Oral BID   venlafaxine XR  225 mg Oral Q breakfast   Continuous Infusions:   PRN Meds: acetaminophen **OR** [DISCONTINUED] acetaminophen (TYLENOL) oral liquid 160 mg/5 mL **OR** [DISCONTINUED] acetaminophen, HYDROcodone-acetaminophen, methocarbamol, metoprolol tartrate   Vital Signs    Vitals:   06/27/23 2323 06/28/23 0423 06/28/23 0832 06/28/23 1242  BP: 111/74 135/64 126/68 123/66  Pulse: 72 74 87 75  Resp: 16 13 13 16   Temp: 98.8 F (37.1 C) 98 F (36.7 C) 97.6 F (36.4 C) 98.6 F (37 C)  TempSrc: Oral Oral Oral Oral  SpO2: 90% 93% 92% 90%  Weight:      Height:        Intake/Output Summary (Last 24 hours) at 06/28/2023 1349 Last data filed at 06/28/2023 1245 Gross per 24 hour  Intake 240 ml  Output 5925 ml   Net -5685 ml      06/24/2023    9:11 PM 06/24/2023    9:00 PM 06/09/2023    1:26 PM  Last 3 Weights  Weight (lbs) 373 lb 0.3 oz 373 lb 0.3 oz 346 lb  Weight (kg) 169.2 kg 169.2 kg 156.945 kg      Telemetry    SR with intermitt  PVCs    - Personally Reviewed  ECG    No new  - Personally Reviewed  Physical Exam  \ GEN: Morbidly obese 67 yo in no  acute distress.  Sitting in chair     Neck: Neck again is  full  Unable to assess JVP  Cardiac: RRR, no murmurs, Respiratory: Clear to auscultation GI: Obese  Not tense   MS: Tr to 1+ edema;   Labs    High Sensitivity Troponin:  No results for input(s): "TROPONINIHS" in the last 720 hours.   Chemistry Recent Labs  Lab 06/26/23 0405 06/27/23 0410 06/28/23 0551  NA 139 138 139  K 3.2* 3.2* 3.6  CL 101 99 101  CO2 29 31 29   GLUCOSE 101* 139* 91  BUN 27* 23 23  CREATININE 1.29* 1.37* 1.04  CALCIUM 8.4* 8.4* 8.7*  MG 2.3 2.5* 2.5*  PROT 6.4* 6.2* 6.6  ALBUMIN 3.3* 3.2* 3.4*  AST 13* 18 16  ALT 17 17 16   ALKPHOS 41 42 44  BILITOT 0.4 0.4 0.7  GFRNONAA >60 57* >60  ANIONGAP 9 8 9     Lipids  Recent Labs  Lab 06/25/23 0426  CHOL 151  TRIG 209*  HDL 30*  LDLCALC 79  CHOLHDL 5.0    Hematology Recent Labs  Lab 06/26/23 0405 06/27/23 0410 06/28/23 0551  WBC 9.3 10.7* 10.4  RBC 4.49 4.29 4.52  HGB 13.1 12.3* 12.8*  HCT 40.7 38.7* 40.1  MCV 90.6 90.2 88.7  MCH 29.2 28.7 28.3  MCHC 32.2 31.8 31.9  RDW 17.2* 17.0* 16.4*  PLT 277 264 291   Thyroid  Recent Labs  Lab 06/24/23 2328  TSH 1.561    BNPNo results for input(s): "BNP", "PROBNP" in the last 168 hours.  DDimer No results for input(s): "DDIMER" in the last 168 hours.   Radiology    No results found.  Cardiac Studies  1. Technically difficult study.   2. Left ventricular ejection fraction, by estimation, is 50 to 55%. The  left ventricle has low normal function. Left ventricular endocardial  border not optimally defined to evaluate regional  wall motion. The left  ventricular internal cavity size was  dilated. Left ventricular diastolic function could not be evaluated.   3. Right ventricular systolic function is normal. The right ventricular  size is normal. Mildly increased right ventricular wall thickness.   4. There is no evidence of cardiac tamponade.   5. The mitral valve is grossly normal. No evidence of mitral valve  regurgitation. No evidence of mitral stenosis.   6. The aortic valve was not well visualized. Aortic valve regurgitation  is not visualized. But based on doppler parameter cannot rule out mild  (per max velocity and MG) to moderate aortic stenosis (dimensional index).   7. Aortic ascending aorta is structurally normal, with no evidence of  dilitation.   8. Agitated saline contrast bubble study was inconclusive.   Conclusion(s)/Recommendation(s): No intracardiac source of embolism  detected on this transthoracic study. Consider a transesophageal  echocardiogram to exclude cardiac source of embolism if clinically  indicated.     Patient Profile      REILY SCHEUNEMAN is a 67 y.o. male with a hx of paroxysmal atrial fibrillation, aortic stenosis status post TAVR, chronic HFpEF, PVCs, seizures, Hypertension, hyperlipidemia, type 2 diabetes, OSA uses BiPAP, who is being seen 06/26/2023 for the evaluation of wide-complex tachycardia at the request of Dr. Thedore Mins.   Assessment & Plan    1  Rhythm   Pt remains ins SR with PVCs    ON b blocker 25 bid    WIll make sure he is arranged for outpt Zio patch at d/c to evaluate burden Pt with SR with PVCs No significant arrhythmias B BLocker added yesterday     Would increase to 25 mg bid   Follow    2  HFpEF    Volume status is difficult given pt's body habitus But, he complains of SOB and he is still requiring O2 Aline   He was not on piror to hospitalization. Cr has come down    I would add Zaroxolyn and follow repsonse today    Reassess labs in AM    For  questions or updates, please contact [facility] Please consult [email or web address] for contact info under        Signed, Dietrich Pates, MD  06/28/2023, 1:49 PM

## 2023-06-28 NOTE — Progress Notes (Signed)
Inpatient Rehabilitation Admission Medication Review by a Pharmacist  A complete drug regimen review was completed for this patient to identify any potential clinically significant medication issues.  High Risk Drug Classes Is patient taking? Indication by Medication  Antipsychotic {Receiving?:26196} Aripiprazole - depression  Anticoagulant {Receiving?:26196} Apixaban - stroke prophylaxis  Antibiotic {Receiving?:26196}   Opioid {Receiving?:26196}   Antiplatelet {Receiving?:26196}   Hypoglycemics/insulin {Yes or No?:26198} Aspart, semglee - DM  Vasoactive Medication {Receiving?:26196} Metoprolol, spironolactone, torsemide - Afib, HFpEF, HTN  Chemotherapy {Receiving Chemo?:26197}   Other {Yes or No?:26198} Buspirone, venlafaxine XR - depression Vitamin D, B12, ferrous sulfate, KCl - supplement Colchicine - gout flare Fenofibrate, Vascepa - HLD Breo, Incruse - COPD Lacosamide, levetiracetam - seizures Pantoprazole   Pregabalin  Rosuvastatin      Type of Medication Issue Identified Description of Issue Recommendation(s)  Drug Interaction(s) (clinically significant)     Duplicate Therapy     Allergy     No Medication Administration End Date   Follow-op LOT for colchicine - 5 doses  Incorrect Dose     Additional Drug Therapy Needed     Significant med changes from prior encounter (inform family/care partners about these prior to discharge). PTA medication not resumed: allopurinol (held with flare), metoprolol XL (changed to IR), atorvastatin (changed to rosuvastatin),  Consider resuming PTA medications during CIR admit or upon discharge as appropriate    Other       Clinically significant medication issues were identified that warrant physician communication and completion of prescribed/recommended actions by midnight of the next day:  {Yes or No?:26198}  Name of provider notified for urgent issues identified:   Provider Method of Notification:     Pharmacist comments:    Time spent performing this drug regimen review (minutes):  ***

## 2023-06-28 NOTE — H&P (Signed)
Physical Medicine and Rehabilitation Admission H&P    Chief Complaint  Patient presents with   Weakness    HPI:  Lawrence [name] is a 67 year old male with history of CAF-on eliquis, CVA w/residual L-HP, T2DM with neuropathy, MDD, GAD, chronic pain s/p spinal cord stimulator, seizure d/o, COPD, OSA, TAVR, chronic diastolic CHF, SOB w/activity,chronic stasis ulcers,  morbid obesity-BMI 48;  who was admitted on 06/25/23 after found unresponsive in his recliner.  He aroused with sternal rub but was noted to have slurred speech with left sided weakness.  Dr. Wilford Corner questioned recurdescence of old stroke symptoms, unwitnessed seizure or hypercarbia with narcosis as cause. MRI brain revealed small left inferior infarct felt to be silent and Dr. Pearlean Brownie recommended continuing Eliquis bid. Acute respiratory failure treated with supplemental oxygen and he has been refusing CPAP.  Lyrica and Trazodone held per Dr. Teresa Coombs and BP meds held to allow for permissive HTN. He was noted to have wide complex tachycardia felt to be atrial in nature and low dose BB resumed with recommendations to titrate to 25 mg BID. He continues to report SOB/CP possibly due to fluid retention related to HFpEF, cardiology started [facility] today.  Plan for outpatient Zio patch.   WOC consulted for input on chronic stastis ulcers LLE with open wounds and RLE with healing wounds. Aquacel added for draining wound LLE and xeroform to scabbed areas on RLE with compression for edema control.   He has had multiple recent admissions and has aide 4 days/week to assist with B/D and home management. .    Lives in a one-story apartment no steps to enter.  He reports his significant other can assist after discharge.  Review of Systems  Constitutional:  Positive for malaise/fatigue. Negative for chills and fever.  HENT:  Negative for hearing loss.   Eyes:  Positive for double vision (has had double vision in the past, not currently having  this). Negative for blurred vision.  Respiratory:  Positive for shortness of breath.   Cardiovascular:  Positive for chest pain.  Gastrointestinal:  Negative for abdominal pain, constipation, diarrhea, nausea and vomiting.  Genitourinary:  Negative for dysuria.  Musculoskeletal:  Positive for myalgias.  Skin:  Negative for rash.  Neurological:  Positive for tingling, sensory change and focal weakness. Negative for dizziness, speech change and headaches.  Psychiatric/Behavioral:  The patient has insomnia (has nightmares at night).     Past Medical History:  Diagnosis Date   Acquired dilation of ascending aorta and aortic root (HCC)    40mm by echo 01/2021   Adenomatous colon polyp 2007   Anemia    Anxiety    Asthma    BPH without obstruction/lower urinary tract symptoms 02/22/2017   Chronic diastolic (congestive) heart failure (HCC)    Chronic venous stasis 03/07/2019   COPD (chronic obstructive pulmonary disease) (HCC)    Coronary artery calcification seen on CAT scan    Depression    Diabetic neuropathy (HCC) 09/11/2019   History of colon polyps 08/24/2018   Hypertension    Morbid obesity (HCC)    OSA (obstructive sleep apnea)    Pain due to onychomycosis of toenails of both feet 09/11/2019   Peripheral neuropathy 02/22/2017   Primary osteoarthritis, left shoulder 03/05/2017   PTSD (post-traumatic stress disorder)    Pure hypercholesterolemia    QT prolongation 03/07/2019   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 Lynnette Caffey and Dr.  Bartle   Seizures (HCC)    Severe aortic stenosis    Sinus tachycardia 03/07/2019   Sleep apnea    CPAP   Type 2 diabetes mellitus with vascular disease (HCC) 09/11/2019    Past Surgical History:  Procedure Laterality Date   ENDOVENOUS ABLATION SAPHENOUS VEIN W/ LASER Right 08/20/2020   endovenous laser ablation right greater saphenous vein by Cari Caraway MD    ENDOVENOUS  ABLATION SAPHENOUS VEIN W/ LASER Left 11/16/2022   endovenous laser ablation left greater saphenous vein by Cari Caraway MD   INTRAOPERATIVE TRANSTHORACIC ECHOCARDIOGRAM N/A 02/07/2023   Procedure: INTRAOPERATIVE TRANSTHORACIC ECHOCARDIOGRAM;  Surgeon: Orbie Pyo, MD;  Location: [facility] INVASIVE CV LAB;  Service: Open Heart Surgery;  Laterality: N/A;   JOINT REPLACEMENT     left knee replacement x 2   KNEE ARTHROSCOPY Bilateral    LEFT HEART CATH AND CORONARY ANGIOGRAPHY N/A 01/17/2018   Procedure: LEFT HEART CATH AND CORONARY ANGIOGRAPHY;  Surgeon: Marykay Lex, MD;  Location: [facility] INVASIVE CV LAB;  Service: Cardiovascular;  Laterality: N/A;   PRESSURE SENSOR/CARDIOMEMS N/A 08/26/2021   Procedure: PRESSURE SENSOR/CARDIOMEMS;  Surgeon: Laurey Morale, MD;  Location: [facility] INVASIVE CV LAB;  Service: Cardiovascular;  Laterality: N/A;   RIGHT HEART CATH N/A 08/26/2021   Procedure: RIGHT HEART CATH;  Surgeon: Laurey Morale, MD;  Location: [facility] INVASIVE CV LAB;  Service: Cardiovascular;  Laterality: N/A;   RIGHT HEART CATH AND CORONARY ANGIOGRAPHY N/A 01/26/2023   Procedure: RIGHT HEART CATH AND CORONARY ANGIOGRAPHY;  Surgeon: Orbie Pyo, MD;  Location: [facility] INVASIVE CV LAB;  Service: Cardiovascular;  Laterality: N/A;   TEE WITHOUT CARDIOVERSION N/A 08/26/2021   Procedure: TRANSESOPHAGEAL ECHOCARDIOGRAM (TEE);  Surgeon: Laurey Morale, MD;  Location: [facility] ENDOSCOPY;  Service: Cardiovascular;  Laterality: N/A;   TOOTH EXTRACTION N/A 02/03/2023   Procedure: DENTAL RESTORATION/EXTRACTIONS;  Surgeon: [name], DMD;  Location: [facility] OR;  Service: Oral Surgery;  Laterality: N/A;   TRANSCATHETER AORTIC VALVE REPLACEMENT, TRANSFEMORAL Right 02/07/2023   Procedure: Transcatheter Aortic Valve Replacement, Transfemoral;  Surgeon: Orbie Pyo, MD;  Location: [facility] INVASIVE CV LAB;  Service: Open Heart Surgery;  Laterality: Right;   UMBILICAL HERNIA REPAIR      Family History  Problem Relation Age of  Onset   CAD Maternal Grandfather    Diabetes Other    Diabetes Mellitus II Neg Hx    Colon cancer Neg Hx    Esophageal cancer Neg Hx    Inflammatory bowel disease Neg Hx    Liver disease Neg Hx    Pancreatic cancer Neg Hx    Rectal cancer Neg Hx    Stomach cancer Neg Hx    Sleep apnea Neg Hx    Social History:  reports that he quit smoking about 7 years ago. His smoking use included cigarettes. He started smoking about 51 years ago. He has a 44 pack-year smoking history. He has been exposed to tobacco smoke. He has never used smokeless tobacco. He reports current alcohol use. He reports that he does not use drugs. Allergies:  Allergies  Allergen Reactions   Vancomycin Other (See Comments)    "Red Man Syndrome" 02/02/17: possible cause for rash under both arms   Niacin And Related Other (See Comments)    Red man syndrome   Tubersol [Tuberculin, Ppd] Other (See Comments)    Reaction unknown   Doxycycline Rash and Other (See Comments)   Medications Prior to Admission  Medication Sig Dispense Refill  acetaminophen (TYLENOL) 650 MG CR tablet Take 1,300 mg by mouth every 8 (eight) hours as needed for pain.     albuterol (VENTOLIN HFA) 108 (90 Base) MCG/ACT inhaler Inhale 2 puffs into the lungs every 6 (six) hours as needed for wheezing or shortness of breath. 1 each 6   allopurinol (ZYLOPRIM) 100 MG tablet Take 200 mg by mouth 2 (two) times daily.     apixaban (ELIQUIS) 5 MG TABS tablet Take 1 tablet (5 mg total) by mouth 2 (two) times daily. 180 tablet 3   ARIPiprazole (ABILIFY) 5 MG tablet Take 1 tablet (5 mg total) by mouth at bedtime. 60 tablet 0   atorvastatin (LIPITOR) 40 MG tablet Take 1 tablet (40 mg total) by mouth daily. (Patient taking differently: Take 40 mg by mouth at bedtime.) 90 tablet 3   busPIRone (BUSPAR) 10 MG tablet Take 1 tablet (10 mg total) by mouth 3 (three) times daily. 270 tablet 0   cetirizine (ZYRTEC) 10 MG tablet Take 10 mg by mouth at bedtime.       Cholecalciferol (VITAMIN D3 PO) Take 2,000 Units by mouth daily.     cyanocobalamin (VITAMIN B12) 1000 MCG tablet Take 1,000 mcg by mouth daily.     dapagliflozin propanediol (FARXIGA) 10 MG TABS tablet TAKE ONE TABLET BY MOUTH BEFORE BREAKFAST 90 tablet 3   fenofibrate (TRICOR) 145 MG tablet Take 1 tablet (145 mg total) by mouth daily. 90 tablet 3   ferrous sulfate 324 (65 Fe) MG TBEC Take 324 mg by mouth daily with breakfast.      fluticasone (FLONASE) 50 MCG/ACT nasal spray Place 2 sprays into both nostrils daily.      Fluticasone-Umeclidin-Vilant (TRELEGY ELLIPTA) 200-62.5-25 MCG/ACT AEPB INHALE 1 PUFF BY MOUTH INTO LUNGS ONCE DAILY 60 each 11   icosapent Ethyl (VASCEPA) 1 g capsule Take 2 capsules (2 g total) by mouth 2 (two) times daily. 120 capsule 3   insulin degludec (TRESIBA FLEXTOUCH) 100 UNIT/ML FlexTouch Pen INJECT 40 UNITS INTO THE SKIN ONCE DAILY 10 mL 0   Lacosamide 150 MG TABS Take 1 tablet (150 mg total) by mouth 2 (two) times daily. 180 tablet 3   levETIRAcetam (KEPPRA) 500 MG tablet Take 1 tablet (500 mg total) by mouth 2 (two) times daily. 180 tablet 3   metFORMIN (GLUCOPHAGE-XR) 500 MG 24 hr tablet Take 4 tablets (2,000 mg total) by mouth daily. (Patient taking differently: Take 2,000 mg by mouth 2 (two) times daily with a meal.) 360 tablet 3   metoprolol succinate (TOPROL-XL) 50 MG 24 hr tablet Take 1 tablet (50 mg total) by mouth daily. 90 tablet 3   mirtazapine (REMERON) 15 MG tablet Take 1 tablet (15 mg total) by mouth at bedtime. 30 tablet 0   omeprazole (PRILOSEC) 20 MG capsule Take 20 mg by mouth every morning.     potassium chloride SA (KLOR-CON M) 20 MEQ tablet TAKE (2 TABLETS) IN THE MORNING, TAKE (1 TABLET) IN THE EVENING 90 tablet 3   prazosin (MINIPRESS) 2 MG capsule Take 2 capsules (4 mg total) by mouth at bedtime. 60 capsule 2   pregabalin (LYRICA) 150 MG capsule Take 1 capsule (150 mg total) by mouth 3 (three) times daily. 270 capsule 3    repaglinide (PRANDIN) 2 MG tablet TAKE TWO TABLETS BY MOUTH TWICE DAILY BEFORE A meal (Patient taking differently: Take 2 mg by mouth 2 (two) times daily before a meal.) 120 tablet 0   spironolactone (ALDACTONE) 25 MG  tablet Take 1 tablet (25 mg total) by mouth daily. 30 tablet 3   tirzepatide (MOUNJARO) 15 MG/0.5ML Pen INJECT 15 MG INTO THE SKIN ONCE A WEEK. (Patient taking differently: 15 mg once a week. Inject on Monday) 6 mL 0   torsemide (DEMADEX) 20 MG tablet Take 6 tablets (120 mg total) by mouth in the morning and at bedtime. 1080 tablet 3   traZODone (DESYREL) 100 MG tablet TAKE 2 TABLETS BY MOUTH AT BEDTIME. 120 tablet 0   venlafaxine XR (EFFEXOR-XR) 75 MG 24 hr capsule Take 3 capsules (225 mg total) by mouth daily with breakfast. 270 capsule 0   [DISCONTINUED] insulin aspart (NOVOLOG FLEXPEN) 100 UNIT/ML FlexPen INJECT 12 UNITS BEFORE MEALS, keep sugar TWO HOURS AFTER meals UNDER 180 AT least 15 mL 3      Home: Home Living Family/patient expects to be discharged to:: Private residence Living Arrangements: Spouse/significant other Available Help at Discharge: Family, Available 24 hours/day, Personal care attendant Type of Home: Apartment Home Access: Level entry Home Layout: One level Bathroom Shower/Tub: Engineer, manufacturing systems: Standard Bathroom Accessibility: Yes Home Equipment: Rollator (4 wheels), Electric scooter, Hospital bed (lift chair) Additional Comments: Has a PCA 4 days a week for about 4 hours a day (13 hour week); wears CPAP at night, no O2 at home  Lives With: Significant other   Functional History: Prior Function Prior Level of Function : Needs assist Physical Assist : ADLs (physical) ADLs (physical): Bathing, Dressing Mobility Comments: Rollator for household mobility (short distances); Art gallery manager in community dwellings. ADLs Comments: aide assists with bathing/dressing, provides set up A for grooming tasks, light household chores,  utilizes  transportation services. uses BSC does not go into bathroom; enjoys going into the community  Functional Status:  Mobility: Bed Mobility Overal bed mobility: Needs Assistance Bed Mobility: Supine to Sit Supine to sit: Supervision, HOB elevated, Used rails General bed mobility comments: increased time Transfers Overall transfer level: Needs assistance Equipment used: Rolling walker (2 wheels) Transfers: Sit to/from Stand, Bed to chair/wheelchair/BSC Sit to Stand: Mod assist, Min assist Bed to/from chair/wheelchair/BSC transfer type:: Step pivot Step pivot transfers: Min assist General transfer comment: STS from elevated EOB with mod A progressing to min A from the recliner. Min A for stability and R knee blocking to step to the recliner Ambulation/Gait Ambulation/Gait assistance: Min assist, +2 safety/equipment Gait Distance (Feet): 6 Feet Assistive device: Rolling walker (2 wheels) Gait Pattern/deviations: Knee hyperextension - right, Decreased stride length, Step-through pattern, Trunk flexed General Gait Details: deferred due to increased fatigue and need for chair follow for safety Gait velocity: decreased    ADL: ADL Overall ADL's : Needs assistance/impaired Eating/Feeding: Modified independent Grooming: Set up, Sitting Upper Body Bathing: Minimal assistance, Sitting Lower Body Bathing: Maximal assistance, Sit to/from stand Upper Body Dressing : Moderate assistance Lower Body Dressing: Maximal assistance, Sit to/from stand Toilet Transfer: Moderate assistance, +2 for physical assistance, Stand-pivot, Rolling walker (2 wheels) Toileting- Clothing Manipulation and Hygiene: Maximal assistance Toileting - Clothing Manipulation Details (indicate cue type and reason): incontinent of urine at times; uses urinal frequently Functional mobility during ADLs: Moderate assistance, +2 for safety/equipment (progressed to min A +2 for safety)  Cognition: Cognition Overall Cognitive  Status: Impaired/Different from baseline Arousal/Alertness: Awake/alert Orientation Level: Oriented X4 Attention: Sustained Sustained Attention: Impaired Sustained Attention Impairment: Verbal basic Memory: Impaired Memory Impairment: Retrieval deficit Awareness: Appears intact Problem Solving: Impaired Problem Solving Impairment: Verbal basic Cognition Arousal: Alert Behavior During Therapy: WFL for tasks assessed/performed Overall  Cognitive Status: Impaired/Different from baseline  Physical Exam: Blood pressure 126/68, pulse 87, temperature 97.6 F (36.4 C), temperature source Oral, resp. rate 13, height 6\' 2"  (1.88 m), weight (!) 169.2 kg, SpO2 92%.    General: , No apparent distress, morbidly obese HEENT: Head is normocephalic, atraumatic, PERRLA, EOMI, sclera anicteric, oral mucosa pink and moist, several teeth absent, wearing glasses neck: Supple  Heart: Reg rate and rhythm. No murmurs rubs or gallops, + Mild Chest wall tenderness  Chest: CTA bilaterally without wheezes, rales, or rhonchi; no distress +[place] 3L O2 Abdomen: Soft, mildly-tender, non-distended, bowel sounds positive. Extremities: No clubbing, cyanosis, 1+ bilateral lower extremity edema Psych: Pt's affect is appropriate. Pt is cooperative Skin: Dry dressing in place to  b/l ankles  Neuro:    Mental Status: AAOx3, memory intact, fund of knowledge appropriate Speech/Languate: Naming and repetition intact, fluent, follows simple commands CRANIAL NERVES: II: PERRL. Visual fields full III, IV, VI: EOM intact, no gaze preference or deviation V: Altered sensation left face VII: no asymmetry VIII: normal hearing to speech IX, X: normal palatal elevation XI: 5/5 head turn and 5/5 shoulder shrug bilaterally XII: Tongue midline    MOTOR: RUE: 5/5 Deltoid, 5/5 Biceps, 5/5 Triceps,5/5 Grip LUE: 4/5 Deltoid, 4/5 Biceps, 4/5 Triceps, 4/5 Grip RLE: HF 5/5, KE 5/5, ADF 5/5, APF 5/5 LLE: HF 1-2/5, KE 1/5, ADF 2/5,  APF 2/5   No hypertonia noted  REFLEXES: No ankle clonus noted  SENSORY: Altered sensation to light touch in left upper extremity and left lower extremity.  Coordination: Finger to nose mildly slowed in the left compared to the right  The left upper extremity IV looks okay   Results for orders placed or performed during the hospital encounter of 06/24/23 (from the past 48 hour(s))  Glucose, capillary     Status: Abnormal   Collection Time: 06/26/23 11:57 AM  Result Value Ref Range   Glucose-Capillary 108 (H) 70 - 99 mg/dL    Comment: Glucose reference range applies only to samples taken after fasting for at least 8 hours.  Glucose, capillary     Status: Abnormal   Collection Time: 06/26/23  4:04 PM  Result Value Ref Range   Glucose-Capillary 130 (H) 70 - 99 mg/dL    Comment: Glucose reference range applies only to samples taken after fasting for at least 8 hours.  Glucose, capillary     Status: Abnormal   Collection Time: 06/26/23  8:45 PM  Result Value Ref Range   Glucose-Capillary 104 (H) 70 - 99 mg/dL    Comment: Glucose reference range applies only to samples taken after fasting for at least 8 hours.   Comment 1 Notify RN    Comment 2 Document in Chart   CBC with Differential/Platelet     Status: Abnormal   Collection Time: 06/27/23  4:10 AM  Result Value Ref Range   WBC 10.7 (H) 4.0 - 10.5 K/uL   RBC 4.29 4.22 - 5.81 MIL/uL   Hemoglobin 12.3 (L) 13.0 - 17.0 g/dL   HCT 84.1 (L) 32.4 - 40.1 %   MCV 90.2 80.0 - 100.0 fL   MCH 28.7 26.0 - 34.0 pg   MCHC 31.8 30.0 - 36.0 g/dL   RDW 02.7 (H) 25.3 - 66.4 %   Platelets 264 150 - 400 K/uL   nRBC 0.0 0.0 - 0.2 %   Neutrophils Relative % 70 %   Neutro Abs 7.6 1.7 - 7.7 K/uL   Lymphocytes Relative 16 %  Lymphs Abs 1.7 0.7 - 4.0 K/uL   Monocytes Relative 7 %   Monocytes Absolute 0.8 0.1 - 1.0 K/uL   Eosinophils Relative 5 %   Eosinophils Absolute 0.5 0.0 - 0.5 K/uL   Basophils Relative 1 %   Basophils Absolute 0.1 0.0  - 0.1 K/uL   Immature Granulocytes 1 %   Abs Immature Granulocytes 0.06 0.00 - 0.07 K/uL    Comment: Performed at [facility], [address], [place], [place] [address]  Comprehensive metabolic panel     Status: Abnormal   Collection Time: 06/27/23  4:10 AM  Result Value Ref Range   Sodium 138 135 - 145 mmol/L   Potassium 3.2 (L) 3.5 - 5.1 mmol/L   Chloride 99 98 - 111 mmol/L   CO2 31 22 - 32 mmol/L   Glucose, Bld 139 (H) 70 - 99 mg/dL    Comment: Glucose reference range applies only to samples taken after fasting for at least 8 hours.   BUN 23 8 - 23 mg/dL   Creatinine, Ser 6.21 (H) 0.61 - 1.24 mg/dL   Calcium 8.4 (L) 8.9 - 10.3 mg/dL   Total Protein 6.2 (L) 6.5 - 8.1 g/dL   Albumin 3.2 (L) 3.5 - 5.0 g/dL   AST 18 15 - 41 U/L   ALT 17 0 - 44 U/L   Alkaline Phosphatase 42 38 - 126 U/L   Total Bilirubin 0.4 <1.2 mg/dL   GFR, Estimated 57 (L) >60 mL/min    Comment: (NOTE) Calculated using the CKD-EPI Creatinine Equation (2021)    Anion gap 8 5 - 15    Comment: Performed at [facility], [address] [place], [place] [address]  Phosphorus     Status: None   Collection Time: 06/27/23  4:10 AM  Result Value Ref Range   Phosphorus 3.3 2.5 - 4.6 mg/dL    Comment: Performed at [facility], [address] [place], [place] [address]  Magnesium     Status: Abnormal   Collection Time: 06/27/23  4:10 AM  Result Value Ref Range   Magnesium 2.5 (H) 1.7 - 2.4 mg/dL    Comment: Performed at [facility], [address], [place], [place] [address]  Glucose, capillary     Status: Abnormal   Collection Time: 06/27/23  8:15 AM  Result Value Ref Range   Glucose-Capillary 136 (H) 70 - 99 mg/dL    Comment: Glucose reference range applies only to samples taken after fasting for at least 8 hours.  Glucose, capillary     Status: Abnormal   Collection Time: 06/27/23 11:19 AM  Result Value Ref Range   Glucose-Capillary 138 (H) 70 - 99 mg/dL    Comment: Glucose  reference range applies only to samples taken after fasting for at least 8 hours.  Glucose, capillary     Status: Abnormal   Collection Time: 06/27/23  4:17 PM  Result Value Ref Range   Glucose-Capillary 107 (H) 70 - 99 mg/dL    Comment: Glucose reference range applies only to samples taken after fasting for at least 8 hours.  Glucose, capillary     Status: None   Collection Time: 06/27/23  9:16 PM  Result Value Ref Range   Glucose-Capillary 94 70 - 99 mg/dL    Comment: Glucose reference range applies only to samples taken after fasting for at least 8 hours.  CBC with Differential/Platelet     Status: Abnormal   Collection  Time: 06/28/23  5:51 AM  Result Value Ref Range   WBC 10.4 4.0 - 10.5 K/uL   RBC 4.52 4.22 - 5.81 MIL/uL   Hemoglobin 12.8 (L) 13.0 - 17.0 g/dL   HCT 24.4 01.0 - 27.2 %   MCV 88.7 80.0 - 100.0 fL   MCH 28.3 26.0 - 34.0 pg   MCHC 31.9 30.0 - 36.0 g/dL   RDW 53.6 (H) 64.4 - 03.4 %   Platelets 291 150 - 400 K/uL   nRBC 0.0 0.0 - 0.2 %   Neutrophils Relative % 67 %   Neutro Abs 7.0 1.7 - 7.7 K/uL   Lymphocytes Relative 20 %   Lymphs Abs 2.1 0.7 - 4.0 K/uL   Monocytes Relative 8 %   Monocytes Absolute 0.8 0.1 - 1.0 K/uL   Eosinophils Relative 4 %   Eosinophils Absolute 0.4 0.0 - 0.5 K/uL   Basophils Relative 0 %   Basophils Absolute 0.0 0.0 - 0.1 K/uL   Immature Granulocytes 1 %   Abs Immature Granulocytes 0.07 0.00 - 0.07 K/uL    Comment: Performed at [facility], [address] [place], [place] [address]  Comprehensive metabolic panel     Status: Abnormal   Collection Time: 06/28/23  5:51 AM  Result Value Ref Range   Sodium 139 135 - 145 mmol/L   Potassium 3.6 3.5 - 5.1 mmol/L   Chloride 101 98 - 111 mmol/L   CO2 29 22 - 32 mmol/L   Glucose, Bld 91 70 - 99 mg/dL    Comment: Glucose reference range applies only to samples taken after fasting for at least 8 hours.   BUN 23 8 - 23 mg/dL   Creatinine, Ser 5.63 0.61 - 1.24 mg/dL   Calcium 8.7 (L) 8.9  - 10.3 mg/dL   Total Protein 6.6 6.5 - 8.1 g/dL   Albumin 3.4 (L) 3.5 - 5.0 g/dL   AST 16 15 - 41 U/L   ALT 16 0 - 44 U/L   Alkaline Phosphatase 44 38 - 126 U/L   Total Bilirubin 0.7 <1.2 mg/dL   GFR, Estimated >87 >56 mL/min    Comment: (NOTE) Calculated using the CKD-EPI Creatinine Equation (2021)    Anion gap 9 5 - 15    Comment: Performed at [facility], [address] [place], [place] [address]  Phosphorus     Status: None   Collection Time: 06/28/23  5:51 AM  Result Value Ref Range   Phosphorus 3.4 2.5 - 4.6 mg/dL    Comment: Performed at [facility], [address], [place], [place] [address]  Magnesium     Status: Abnormal   Collection Time: 06/28/23  5:51 AM  Result Value Ref Range   Magnesium 2.5 (H) 1.7 - 2.4 mg/dL    Comment: Performed at [facility], [address] [place], [place] [address]  Glucose, capillary     Status: Abnormal   Collection Time: 06/28/23  8:29 AM  Result Value Ref Range   Glucose-Capillary 122 (H) 70 - 99 mg/dL    Comment: Glucose reference range applies only to samples taken after fasting for at least 8 hours.   No results found.    Blood pressure 126/68, pulse 87, temperature 97.6 F (36.4 C), temperature source Oral, resp. rate 13, height 6\' 2"  (1.88 m), weight (!) 169.2 kg, SpO2 92%.  Medical Problem List and Plan: 1. Functional deficits secondary to acute ischemic left cerebellar infarct  with hx of prior CVA  -patient may shower, please cover RLE skin breakdown  -ELOS/Goals: 16 to 20 days, PT OT supervision to min assist, SLP supervision MRI  -Admit to CIR 2.  Antithrombotics: -DVT/anticoagulation:  Pharmaceutical: Eliquis  -antiplatelet therapy: N/A 3. Chronic pain/Pain Management: Has spinal cord stimulator. Continue Tylenol prn.   --will d/c hydrocodone and monitor as denies pain at this time.  --continue robaxin prn  4. Mood/Behavior/Sleep: LCSW to follow for evaluation and support.   -antipsychotic  agents: Abilify-->will change to bedtime as at home.   --chronic insomnia was being managed with Trazodone and Remeron--> d/c per neurology --has PTSD/Nightmares but sleeping well in hospital. Resume Prasozin 2 mg/HS if BP stable.  5. Neuropsych/cognition: This patient is capable of making decisions on his own behalf. 6. Skin/Wound Care: Routine pressure relief measures.   -Bilateral lower extremity venous stasis ulcers.  Continue current wound care with Aquacel and Xeroform 7. Fluids/Electrolytes/Nutrition: Monitor I/O. Check CMET in am. Recheck Mg and Phos level in am8.  8. HTN: BP stable. Resume Prazosin when able  --Wide complex tachycardia: No significant arrhthymias seen per Dr. Tenny Craw. Plans on  --metoprolol 12.5 mg bid and can be increased to 25 mg bid.  9. T2DM with peripheral neuropathy: Hgb A1C-6.5 and well controlled. Monitor BS ac/hs and use SSI for elevated BS.   --continue insulin glargine 40 units daily with novolog 12 units TID 10. Chronic diastolic CHF: Added 1500 cc FR per recommendations. HH diet  --Daily weight and monitor for signs of overload--weight at admission 11/16- 373 lbs.  --evaluated by cards today-->BB increased to 25 mg BID, Demadex bid. Zaroxolyn X 1 dose --cards will follow for response in am.   11. Wide complex tachycardia/hx PAF: Started metoprolol 25mg  BID by cardiology. On Eliquis 12. Seizure d/o: Has been seizure free on Keppra and Vimpat -->followed by Dr. Teresa Coombs.  13. Major depressive: PTA--was on Effexor, Abilify, Remeron and Trazodone.   --lethargy could be due to daytime Abilify-->will change to nights  --Remeron and trazodone on hold per neuro 14. GAD w/panic attacks/PTSD: Chronic issues onoging and followed by [facility].  --Was on Prazosin and Buspar.  --Mood stable on current dose Buspar. Monitor for now  15. Hypokalemia: Improved with supplementation. Recheck in am 16. Leucocytosis: WBC trending down-->monitor 17. OSA: Now compliant with CPAP but has  been requiring 5 L oxygen per [place].   --question OHS as contributing factor leading to hypersomnia. Wean oxygen as able.  18. COPD: On Trilegy at home -->on Breo/Incruse  Continue to wean off oxygen.   19. Neuropathy: Managed with spinal cord stimulator and Lyrica (Dr. Beverly Gust Shaw/[facility]).  20. Gout flare R great toe: On colchicine bid for 5 more days, resume allopurinol in 2 to 3 weeks 21. CKD 3A:BUN/Scr-38/1.31 at admission.  22. Morbid Obesity. Body mass index is 47.89 kg/m.  -Dietary counseling     Jacquelynn Cree, PA-C 06/28/2023  I have personally performed a face to face diagnostic evaluation of this patient and formulated the key components of the plan.  Additionally, I have personally reviewed laboratory data, imaging studies, as well as relevant notes and concur with the physician assistant's documentation above.  The patient's status has not changed from the original H&P.  Any changes in documentation from the acute care chart have been noted above.  Fanny Dance, MD, [place]

## 2023-06-28 NOTE — Progress Notes (Addendum)
Inpatient Rehab Admissions Coordinator:   [facility] per [facility] request.  Will follow for determination.   1107: I have [facility] approval and a bed for this patient to admit to CIR today. Dr. Thedore Mins in agreement and [facility] aware.  I will let pt/family know.   Estill Dooms, PT, DPT Admissions Coordinator [phone] 06/28/23  10:08 AM

## 2023-06-28 NOTE — Progress Notes (Addendum)
 Lylia Sand, MD  Physician Physical Medicine and Rehabilitation   PMR Pre-admission    Signed   Date of Service: 06/28/2023 11:08 AM  Related encounter: ED to Hosp-Admission (Current) from 06/24/2023 in [facility] 5W Medical Specialty PCU   Signed     Expand All Collapse All  PMR Admission Coordinator Pre-Admission Assessment   Patient: Lawrence [name] is an 67 y.o., male MRN: [id number] DOB: Jan 14, 1956 Height: 6\' 2"  (188 cm) Weight: (!) 169.2 kg   Insurance Information HMO:     PPO:      PCP:      IPA:      80/20:      OTHER:  PRIMARY: [facility]      Policy#: [id number]      Subscriber: pt CM Name: Hassie Lint      Phone#: [phone]     Fax#: [phone] Pre-Cert#: : [id number] auth for CIR from [facility] with [facility] with updates due to her at fax listed above on 07/28/23    Employer:  Benefits:  Phone #: [phone]     Name:  Eff. Date: 12/31/2017     Deduct: $0      Out of Pocket Max: $0      Life Max: n/a CIR: 100%      SNF:  Outpatient:      Co-Pay:  Home Health:       Co-Pay:  DME:      Co-Pay:  Providers:  SECONDARY: [employer]      Policy#: [id number]     Phone#: [phone]   Financial Counselor:       Phone#:    The "Data Collection Information Summary" for patients in Inpatient Rehabilitation Facilities with attached "Privacy Act Statement-Health Care Records" was provided and verbally reviewed with: Patient and Family   Emergency Contact Information Contact Information       Name Relation Home Work Mobile    Ayott,Janet Significant other [phone]        Platt,Taylor Sister [phone]   [phone]         Other Contacts   None on File        Current Medical History  Patient Admitting Diagnosis: CVA    History of Present Illness: Pt is a 67 y/o male with PMH of afib (on anticoagulation), DM, seizures, CAD, COPD, OSA (on home cpap), aortic stenosis (s/p TAVR), and CVA with residual L sided weakness who admitted to [facility]  on 06/24/23 with c/o difficulty arousing, slurred speech, and L sided facial droop.  In ED BP soft at 113/51, sp02 94%, potassium 5.3, creatinine 1.30, hgb A1C 6.4.  NIHSS 9.  Neurology consulted.  Imaging revealed small L inferior cerebellar infarct felt to be embolic in the setting of AFib despite being on anticoagulation.  Echo with bubble study inconclusive.  Recommendations to continue eliquis  at discharge. Therapy evaluations completed and pt was recommended for CIR.    Complete NIHSS TOTAL: 3   Patient's medical record from [name] has been reviewed by the rehabilitation admission coordinator and physician.   Past Medical History      Past Medical History:  Diagnosis Date   Acquired dilation of ascending aorta and aortic root (HCC)      40mm by echo 01/2021   Adenomatous colon polyp 2007   Anemia     Anxiety     Asthma     BPH without obstruction/lower urinary tract symptoms 02/22/2017   Chronic diastolic (congestive) heart failure (HCC)  Chronic venous stasis 03/07/2019   COPD (chronic obstructive pulmonary disease) (HCC)     Coronary artery calcification seen on CAT scan     Depression     Diabetic neuropathy (HCC) 09/11/2019   History of colon polyps 08/24/2018   Hypertension     Morbid obesity (HCC)     OSA (obstructive sleep apnea)     Pain due to onychomycosis of toenails of both feet 09/11/2019   Peripheral neuropathy 02/22/2017   Primary osteoarthritis, left shoulder 03/05/2017   PTSD (post-traumatic stress disorder)     Pure hypercholesterolemia     QT prolongation 03/07/2019   S/P TAVR (transcatheter aortic valve replacement) 02/07/2023    34mm Evolut FX via TF approach with Dr. Lorie Rook and Dr. Sherene Dilling   Seizures [facility])     Severe aortic stenosis     Sinus tachycardia 03/07/2019   Sleep apnea      CPAP   Type 2 diabetes mellitus with vascular disease (HCC) 09/11/2019          Has the patient had major surgery during 100 days prior to admission? No    Family History   family history includes CAD in his maternal grandfather; Diabetes in an other family member.   Current Medications  Current Medications    Current Facility-Administered Medications:    acetaminophen  (TYLENOL ) tablet 650 mg, 650 mg, Oral, Q4H PRN, 650 mg at 06/28/23 0239 **OR** [DISCONTINUED] acetaminophen  (TYLENOL ) 160 MG/5ML solution 650 mg, 650 mg, Per Tube, Q4H PRN **OR** [DISCONTINUED] acetaminophen  (TYLENOL ) suppository 650 mg, 650 mg, Rectal, Q4H PRN, Crosley, Debby, MD   apixaban  (ELIQUIS ) tablet 5 mg, 5 mg, Oral, BID, Crosley, Debby, MD, 5 mg at 06/28/23 1009   ARIPiprazole  (ABILIFY ) tablet 5 mg, 5 mg, Oral, Daily, Crosley, Debby, MD, 5 mg at 06/28/23 1009   busPIRone  (BUSPAR ) tablet 10 mg, 10 mg, Oral, TID, Crosley, Debby, MD, 10 mg at 06/28/23 1010   cholecalciferol  (VITAMIN D3) 25 MCG (1000 UNIT) tablet 2,000 Units, 2,000 Units, Oral, Daily, Singh, Prashant K, MD, 2,000 Units at 06/28/23 1010   colchicine  tablet 0.3 mg, 0.3 mg, Oral, BID, Singh, Prashant K, MD, 0.3 mg at 06/28/23 1012   cyanocobalamin  (VITAMIN B12) tablet 1,000 mcg, 1,000 mcg, Oral, Daily, Singh, Prashant K, MD, 1,000 mcg at 06/28/23 1009   fenofibrate  tablet 160 mg, 160 mg, Oral, Daily, Crosley, Debby, MD, 160 mg at 06/28/23 1010   ferrous sulfate  tablet 325 mg, 325 mg, Oral, Q breakfast, Singh, Prashant K, MD, 325 mg at 06/28/23 1012   fluticasone  furoate-vilanterol (BREO ELLIPTA ) 200-25 MCG/ACT 1 puff, 1 puff, Inhalation, Daily, 1 puff at 06/26/23 0813 **AND** umeclidinium bromide  (INCRUSE ELLIPTA ) 62.5 MCG/ACT 1 puff, 1 puff, Inhalation, Daily, Crosley, Debby, MD, 1 puff at 06/28/23 0842   HYDROcodone -acetaminophen  (NORCO/VICODIN) 5-325 MG per tablet 1 tablet, 1 tablet, Oral, Q6H PRN, Dezii, Alexandra, DO, 1 tablet at 06/28/23 0518   icosapent  Ethyl (VASCEPA ) 1 g capsule 2 g, 2 g, Oral, BID, Crosley, Debby, MD, 2 g at 06/28/23 1012   insulin  aspart (novoLOG ) injection 0-15 Units, 0-15 Units,  Subcutaneous, TID WC, Crosley, Debby, MD, 2 Units at 06/28/23 1007   insulin  aspart (novoLOG ) injection 0-5 Units, 0-5 Units, Subcutaneous, QHS, Crosley, Debby, MD   insulin  aspart (novoLOG ) injection 12 Units, 12 Units, Subcutaneous, TID WC, Crosley, Debby, MD, 12 Units at 06/28/23 1005   insulin  glargine-yfgn (SEMGLEE ) injection 40 Units, 40 Units, Subcutaneous, Q24H, Crosley, Debby, MD, 40 Units at 06/28/23 1005   lacosamide  (  VIMPAT ) tablet 150 mg, 150 mg, Oral, BID, Crosley, Debby, MD, 150 mg at 06/28/23 1010   levETIRAcetam  (KEPPRA ) tablet 500 mg, 500 mg, Oral, BID, Crosley, Debby, MD, 500 mg at 06/28/23 1009   methocarbamol  (ROBAXIN ) tablet 500 mg, 500 mg, Oral, Q8H PRN, Singh, Prashant K, MD, 500 mg at 06/28/23 1040   metoprolol  tartrate (LOPRESSOR ) injection 5 mg, 5 mg, Intravenous, Q4H PRN, Crosley, Debby, MD   metoprolol  tartrate (LOPRESSOR ) tablet 12.5 mg, 12.5 mg, Oral, BID, Singh, Prashant K, MD, 12.5 mg at 06/28/23 1012   pantoprazole  (PROTONIX ) EC tablet 40 mg, 40 mg, Oral, Daily, Singh, Prashant K, MD, 40 mg at 06/28/23 1009   potassium chloride  SA (KLOR-CON  M) CR tablet 40 mEq, 40 mEq, Oral, Daily, Singh, Prashant K, MD, 40 mEq at 06/28/23 1010   pregabalin  (LYRICA ) capsule 150 mg, 150 mg, Oral, TID, Crosley, Debby, MD, 150 mg at 06/28/23 1010   rosuvastatin  (CRESTOR ) tablet 20 mg, 20 mg, Oral, Daily, Singh, Prashant K, MD, 20 mg at 06/28/23 1009   spironolactone  (ALDACTONE ) tablet 25 mg, 25 mg, Oral, Daily, Singh, Prashant K, MD, 25 mg at 06/28/23 1009   torsemide  (DEMADEX ) tablet 120 mg, 120 mg, Oral, BID, Crosley, Debby, MD, 120 mg at 06/28/23 1009   venlafaxine  XR (EFFEXOR -XR) 24 hr capsule 225 mg, 225 mg, Oral, Q breakfast, Crosley, Debby, MD, 225 mg at 06/28/23 1010     Patients Current Diet:  Diet Order                  Diet heart healthy/carb modified Room service appropriate? Yes; Fluid consistency: Thin  Diet effective now                         Precautions  / Restrictions Precautions Precautions: Fall Precaution Comments: watch HR adn O2 Restrictions Weight Bearing Restrictions: No    Has the patient had 2 or more falls or a fall with injury in the past year? No   Prior Activity Level Community (5-7x/wk): rollator for household distances, scooter for community distances, uses acces [place] for transportation, assist for ADLs (spongebathing/dressing)   Prior Functional Level Self Care: Did the patient need help bathing, dressing, using the toilet or eating? Needed some help   Indoor Mobility: Did the patient need assistance with walking from room to room (with or without device)? Independent   Stairs: Did the patient need assistance with internal or external stairs (with or without device)? Dependent   Functional Cognition: Did the patient need help planning regular tasks such as shopping or remembering to take medications? Independent   Patient Information Are you of Hispanic, Latino/a,or Spanish origin?: A. No, not of Hispanic, Latino/a, or Spanish origin What is your race?: A. White Do you need or want an interpreter to communicate with a doctor or health care staff?: 0. No   Patient's Response To:  Health Literacy and Transportation Is the patient able to respond to health literacy and transportation needs?: Yes Health Literacy - How often do you need to have someone help you when you read instructions, pamphlets, or other written material from your doctor or pharmacy?: Never In the past 12 months, has lack of transportation kept you from medical appointments or from getting medications?: No In the past 12 months, has lack of transportation kept you from meetings, work, or from getting things needed for daily living?: No   Journalist, newspaper / Equipment Home Equipment: Occupational hygienist (4 wheels), [employer] scooter,  Hospital bed (lift chair)   Prior Device Use: Indicate devices/aids used by the patient prior to current illness,  exacerbation or injury? Motorized wheelchair or scooter and Transport planner   Arousal/Alertness: Awake/alert Overall Cognitive Status: Impaired/Different from baseline Orientation Level: Oriented X4 Attention: Sustained Sustained Attention: Impaired Sustained Attention Impairment: Verbal basic Memory: Impaired Memory Impairment: Retrieval deficit Awareness: Appears intact Problem Solving: Impaired Problem Solving Impairment: Verbal basic    Extremity Assessment (includes Sensation/Coordination)   Upper Extremity Assessment: Defer to OT evaluation LUE Deficits / Details: AROM overall WFL; generaly weaker than R but attempting ot use funcitonally; isolated movement patterns LUE Coordination: decreased fine motor  Lower Extremity Assessment: Generalized weakness (Extreme Knee hyperextension on R. Weaker on L>R.)     ADLs   Overall ADL's : Needs assistance/impaired Eating/Feeding: Modified independent Grooming: Set up, Sitting Upper Body Bathing: Minimal assistance, Sitting Lower Body Bathing: Maximal assistance, Sit to/from stand Upper Body Dressing : Moderate assistance Lower Body Dressing: Maximal assistance, Sit to/from stand Toilet Transfer: Moderate assistance, +2 for physical assistance, Stand-pivot, Rolling walker (2 wheels) Toileting- Clothing Manipulation and Hygiene: Maximal assistance Toileting - Clothing Manipulation Details (indicate cue type and reason): incontinent of urine at times; uses urinal frequently Functional mobility during ADLs: Moderate assistance, +2 for safety/equipment (progressed to min A +2 for safety)     Mobility   Overal bed mobility: Needs Assistance Bed Mobility: Supine to Sit Supine to sit: Supervision, HOB elevated, Used rails General bed mobility comments: increased time     Transfers   Overall transfer level: Needs assistance Equipment used: Rolling walker (2 wheels) Transfers: Sit to/from Stand, Bed to  chair/wheelchair/BSC Sit to Stand: Mod assist, Min assist Bed to/from chair/wheelchair/BSC transfer type:: Step pivot Step pivot transfers: Min assist General transfer comment: STS from elevated EOB with mod A progressing to min A from the recliner. Min A for stability and R knee blocking to step to the recliner     Ambulation / Gait / Stairs / Psychologist, prison and probation services   Ambulation/Gait Ambulation/Gait assistance: Min assist, +2 safety/equipment Gait Distance (Feet): 6 Feet Assistive device: Rolling walker (2 wheels) Gait Pattern/deviations: Knee hyperextension - right, Decreased stride length, Step-through pattern, Trunk flexed General Gait Details: deferred due to increased fatigue and need for chair follow for safety Gait velocity: decreased     Posture / Balance Dynamic Sitting Balance Sitting balance - Comments: sitting EOB Balance Overall balance assessment: Needs assistance Sitting-balance support: Bilateral upper extremity supported, No upper extremity supported Sitting balance-Leahy Scale: Fair Sitting balance - Comments: sitting EOB Standing balance support: Bilateral upper extremity supported, During functional activity, Reliant on assistive device for balance Standing balance-Leahy Scale: Poor Standing balance comment: reliant on RW support     Special needs/care consideration CPAP, Oxygen 3L acutely, and Diabetic management yes    Previous Home Environment (from acute therapy documentation) Living Arrangements: Spouse/significant other  Lives With: Significant other Available Help at Discharge: Family, Available 24 hours/day, Personal care attendant Type of Home: Apartment Home Layout: One level Home Access: Level entry Bathroom Shower/Tub: Engineer, manufacturing systems: Standard Bathroom Accessibility: Yes How Accessible: Accessible via walker Home Care Services: Yes Type of Home Care Services: Homehealth aide, Home PT Home Care Agency (if known): [facility] Additional Comments: Has a PCA 4 days a week for about 4 hours a day (13 hour week); wears CPAP at night, no O2 at home   Discharge Living Setting Plans for Discharge Living Setting:  Patient's home, Lives with (comment) (significant other, Bobbe Burner) Type of Home at Discharge: Apartment Discharge Home Layout: One level Discharge Home Access: Level entry Discharge Bathroom Shower/Tub: Tub/shower unit Discharge Bathroom Toilet: Standard Discharge Bathroom Accessibility: Yes How Accessible: Accessible via walker Does the patient have any problems obtaining your medications?: No   Social/Family/Support Systems Patient Roles: Partner Anticipated Caregiver: Bobbe Burner and PCA Anticipated Caregiver's Contact Information: [name] [phone] Ability/Limitations of Caregiver: supervision mobility, min/mod assist ADls Caregiver Availability: 24/7 Discharge Plan Discussed with Primary Caregiver: Yes Is Caregiver In Agreement with Plan?: Yes Does Caregiver/Family have Issues with Lodging/Transportation while Pt is in Rehab?: No   Goals Patient/Family Goal for Rehab: PT/OT supervision to min assist, SLP supervision to mod I Expected length of stay: 16-20 days Additional Information: Discharge plan: return to pt's apart with S/O and PCA providing assit for ADLs as per baseline, supervision goals for mobility Pt/Family Agrees to Admission and willing to participate: Yes Program Orientation Provided & Reviewed with Pt/Caregiver Including Roles  & Responsibilities: Yes   Decrease burden of Care through IP rehab admission: no   Possible need for SNF placement upon discharge: Not anticipated.  Plan to discharge back home with s/o and PCA providing assist for ADls, supervision level for mobility with rollator.    Patient Condition: I have reviewed medical records from [facility], spoken with  [facility] team , and patient and spouse. I met with patient at the bedside and discussed via phone for  inpatient rehabilitation assessment.  Patient will benefit from ongoing PT, OT, and SLP, can actively participate in 3 hours of therapy a day 5 days of the week, and can make measurable gains during the admission.  Patient will also benefit from the coordinated team approach during an Inpatient Acute Rehabilitation admission.  The patient will receive intensive therapy as well as Rehabilitation physician, nursing, social worker, and care management interventions.  Due to bladder management, bowel management, safety, skin/wound care, disease management, medication administration, pain management, and patient education the patient requires 24 hour a day rehabilitation nursing.  The patient is currently mod assist to max assist with mobility and basic ADLs.  Discharge setting and therapy post discharge at home with home health is anticipated.  Patient has agreed to participate in the Acute Inpatient Rehabilitation Program and will admit today.   Preadmission Screen Completed By:  Mickey Alar, PT, DPT 06/28/2023 11:11 AM ______________________________________________________________________   Discussed status with Dr. Rayleen Cal on 06/28/23  at 11:11 AM  and received approval for admission today.   Admission Coordinator:  Genice Kimberlin E Orian Amberg, PT, DPT time 11:11 AM Alanna Hu 06/28/23     Assessment/Plan: Diagnosis: CVA Does the need for close, 24 hr/day Medical supervision in concert with the patient's rehab needs make it unreasonable for this patient to be served in a less intensive setting? Yes Co-Morbidities requiring supervision/potential complications: Respiratory failure, Afib, Vtach vs torsades, OSA, LE edema, Metabolic encephalopathy, CHF, CKD 3A, HTN, Gout, Morbid obesity, Dm2 Due to bladder management, bowel management, safety, skin/wound care, disease management, medication administration, pain management, and patient education, does the patient require 24 hr/day rehab nursing? Yes Does the patient  require coordinated care of a physician, rehab nurse, PT, OT, and SLP to address physical and functional deficits in the context of the above medical diagnosis(es)? Yes Addressing deficits in the following areas: balance, endurance, locomotion, strength, transferring, bowel/bladder control, bathing, dressing, feeding, grooming, toileting, cognition, speech, language, swallowing, and psychosocial support Can the patient actively participate in  an intensive therapy program of at least 3 hrs of therapy 5 days a week? Yes The potential for patient to make measurable gains while on inpatient rehab is excellent Anticipated functional outcomes upon discharge from inpatient rehab: supervision and min assist PT, supervision and min assist OT, modified independent and supervision SLP Estimated rehab length of stay to reach the above functional goals is: 16-20 Anticipated discharge destination: Home 10. Overall Rehab/Functional Prognosis: good     MD Signature: Lylia Sand          Revision History

## 2023-06-28 NOTE — TOC CM/SW Note (Signed)
Transition of Care [facility]) - Inpatient Brief Assessment   Patient Details  Name: Lawrence [name] MRN: [id number] Date of Birth: 12-04-1955  Transition of Care [facility]) CM/SW Contact:    Mearl Latin, LCSW Phone Number: 06/28/2023, 11:53 AM   Clinical Narrative: Patient admitting to CIR today. No TOC needs identified at this time.    Transition of Care Asessment: Insurance and Status: Insurance coverage has been reviewed Patient has primary care physician: Yes Home environment has been reviewed: From home Prior level of function:: Independent Prior/Current Home Services: No current home services Social Determinants of Health Reivew: SDOH reviewed no interventions necessary Readmission risk has been reviewed: Yes Transition of care needs: no transition of care needs at this time

## 2023-06-28 NOTE — Progress Notes (Signed)
Mobility Specialist Progress Note;   06/28/23 1145  Mobility  Activity Stood at bedside (Chair)  Level of Assistance Moderate assist, patient does 50-74%  Assistive Device Front wheel walker  Activity Response Tolerated fair  Mobility Referral Yes  $Mobility charge 1 Mobility  Mobility Specialist Start Time (ACUTE ONLY) 1145  Mobility Specialist Stop Time (ACUTE ONLY) 1200  Mobility Specialist Time Calculation (min) (ACUTE ONLY) 15 min   Pt agreeable to mobility. Worked on STS from chair during session. Required ModA from a non elevated surface for STS. Pt performed 3x STS from chair, getting progressively better with each one. R knee buckling displayed but chair was able to assist blocking it some. Displayed fatigue and pain in R knee at EOS. Pt left in chair with all needs met.  Lawrence [name] Mobility Specialist Please contact via SecureChat or Rehab Office [phone]

## 2023-06-28 NOTE — Progress Notes (Signed)
   06/28/23 2104  BiPAP/CPAP/SIPAP  $ Non-Invasive Home Ventilator  Subsequent  BiPAP/CPAP/SIPAP Pt Type Adult  BiPAP/CPAP/SIPAP Resmed  Mask Type Full face mask  Mask Size Large  PEEP 4 cmH20  Patient Home Equipment Yes  Safety Check Completed by RT for Home Unit Yes, no issues noted

## 2023-06-28 NOTE — H&P (Addendum)
Physical Medicine and Rehabilitation Admission H&P        Chief Complaint  Patient presents with   Weakness      HPI:  Lawrence [name] is a 67 year old male with history of CAF-on eliquis, CVA w/residual L-HP, T2DM with neuropathy, MDD, GAD, chronic pain s/p spinal cord stimulator, seizure d/o, COPD, OSA, TAVR, chronic diastolic CHF, SOB w/activity,chronic stasis ulcers,  morbid obesity-BMI 48;  who was admitted on 06/25/23 after found unresponsive in his recliner.  He aroused with sternal rub but was noted to have slurred speech with left sided weakness.  Dr. Wilford Corner questioned recurdescence of old stroke symptoms, unwitnessed seizure or hypercarbia with narcosis as cause. MRI brain revealed small left inferior infarct felt to be silent and Dr. Pearlean Brownie recommended continuing Eliquis bid. Acute respiratory failure treated with supplemental oxygen and he has been refusing CPAP.   Lyrica and Trazodone held per Dr. Teresa Coombs and BP meds held to allow for permissive HTN. He was noted to have wide complex tachycardia felt to be atrial in nature and low dose BB resumed with recommendations to titrate to 25 mg BID. He continues to report SOB/CP possibly due to fluid retention related to HFpEF, cardiology started [facility] today.  Plan for outpatient Zio patch.    WOC consulted for input on chronic stastis ulcers LLE with open wounds and RLE with healing wounds. Aquacel added for draining wound LLE and xeroform to scabbed areas on RLE with compression for edema control.    He has had multiple recent admissions and has aide 4 days/week to assist with B/D and home management. .     Lives in a one-story apartment no steps to enter.  He reports his significant other can assist after discharge.   Review of Systems  Constitutional:  Positive for malaise/fatigue. Negative for chills and fever.  HENT:  Negative for hearing loss.   Eyes:  Positive for double vision (has had double vision in the past, not  currently having this). Negative for blurred vision.  Respiratory:  Positive for shortness of breath.   Cardiovascular:  Positive for chest pain.  Gastrointestinal:  Negative for abdominal pain, constipation, diarrhea, nausea and vomiting.  Genitourinary:  Negative for dysuria.  Musculoskeletal:  Positive for myalgias.  Skin:  Negative for rash.  Neurological:  Positive for tingling, sensory change and focal weakness. Negative for dizziness, speech change and headaches.  Psychiatric/Behavioral:  The patient has insomnia (has nightmares at night).           Past Medical History:  Diagnosis Date   Acquired dilation of ascending aorta and aortic root (HCC)      40mm by echo 01/2021   Adenomatous colon polyp 2007   Anemia     Anxiety     Asthma     BPH without obstruction/lower urinary tract symptoms 02/22/2017   Chronic diastolic (congestive) heart failure (HCC)     Chronic venous stasis 03/07/2019   COPD (chronic obstructive pulmonary disease) (HCC)     Coronary artery calcification seen on CAT scan     Depression     Diabetic neuropathy (HCC) 09/11/2019   History of colon polyps 08/24/2018   Hypertension     Morbid obesity (HCC)     OSA (obstructive sleep apnea)     Pain due to onychomycosis of toenails of both feet 09/11/2019   Peripheral neuropathy 02/22/2017   Primary osteoarthritis, left shoulder 03/05/2017   PTSD (post-traumatic stress disorder)  Pure hypercholesterolemia     QT prolongation 03/07/2019   S/P TAVR (transcatheter aortic valve replacement) 02/07/2023    34mm Evolut FX via TF approach with Dr. Lynnette Caffey and Dr. Laneta Simmers   Seizures [facility])     Severe aortic stenosis     Sinus tachycardia 03/07/2019   Sleep apnea      CPAP   Type 2 diabetes mellitus with vascular disease (HCC) 09/11/2019               Past Surgical History:  Procedure Laterality Date   ENDOVENOUS ABLATION SAPHENOUS VEIN W/ LASER Right 08/20/2020    endovenous laser ablation right  greater saphenous vein by Cari Caraway MD    ENDOVENOUS ABLATION SAPHENOUS VEIN W/ LASER Left 11/16/2022    endovenous laser ablation left greater saphenous vein by Cari Caraway MD   INTRAOPERATIVE TRANSTHORACIC ECHOCARDIOGRAM N/A 02/07/2023    Procedure: INTRAOPERATIVE TRANSTHORACIC ECHOCARDIOGRAM;  Surgeon: Orbie Pyo, MD;  Location: [facility] INVASIVE CV LAB;  Service: Open Heart Surgery;  Laterality: N/A;   JOINT REPLACEMENT        left knee replacement x 2   KNEE ARTHROSCOPY Bilateral     LEFT HEART CATH AND CORONARY ANGIOGRAPHY N/A 01/17/2018    Procedure: LEFT HEART CATH AND CORONARY ANGIOGRAPHY;  Surgeon: Marykay Lex, MD;  Location: [facility] INVASIVE CV LAB;  Service: Cardiovascular;  Laterality: N/A;   PRESSURE SENSOR/CARDIOMEMS N/A 08/26/2021    Procedure: PRESSURE SENSOR/CARDIOMEMS;  Surgeon: Laurey Morale, MD;  Location: [facility] INVASIVE CV LAB;  Service: Cardiovascular;  Laterality: N/A;   RIGHT HEART CATH N/A 08/26/2021    Procedure: RIGHT HEART CATH;  Surgeon: Laurey Morale, MD;  Location: [facility] INVASIVE CV LAB;  Service: Cardiovascular;  Laterality: N/A;   RIGHT HEART CATH AND CORONARY ANGIOGRAPHY N/A 01/26/2023    Procedure: RIGHT HEART CATH AND CORONARY ANGIOGRAPHY;  Surgeon: Orbie Pyo, MD;  Location: [facility] INVASIVE CV LAB;  Service: Cardiovascular;  Laterality: N/A;   TEE WITHOUT CARDIOVERSION N/A 08/26/2021    Procedure: TRANSESOPHAGEAL ECHOCARDIOGRAM (TEE);  Surgeon: Laurey Morale, MD;  Location: [facility] ENDOSCOPY;  Service: Cardiovascular;  Laterality: N/A;   TOOTH EXTRACTION N/A 02/03/2023    Procedure: DENTAL RESTORATION/EXTRACTIONS;  Surgeon: [name], DMD;  Location: [facility] OR;  Service: Oral Surgery;  Laterality: N/A;   TRANSCATHETER AORTIC VALVE REPLACEMENT, TRANSFEMORAL Right 02/07/2023    Procedure: Transcatheter Aortic Valve Replacement, Transfemoral;  Surgeon: Orbie Pyo, MD;  Location: [facility] INVASIVE CV LAB;  Service: Open Heart Surgery;  Laterality: Right;    UMBILICAL HERNIA REPAIR                   Family History  Problem Relation Age of Onset   CAD Maternal Grandfather     Diabetes Other     Diabetes Mellitus II Neg Hx     Colon cancer Neg Hx     Esophageal cancer Neg Hx     Inflammatory bowel disease Neg Hx     Liver disease Neg Hx     Pancreatic cancer Neg Hx     Rectal cancer Neg Hx     Stomach cancer Neg Hx     Sleep apnea Neg Hx          Social History:  reports that he quit smoking about 7 years ago. His smoking use included cigarettes. He started smoking about 51 years ago. He has a 44 pack-year smoking history. He has been exposed to tobacco smoke. He has  never used smokeless tobacco. He reports current alcohol use. He reports that he does not use drugs. Allergies:  Allergies       Allergies  Allergen Reactions   Vancomycin Other (See Comments)      "Red Man Syndrome" 02/02/17: possible cause for rash under both arms   Niacin And Related Other (See Comments)      Red man syndrome   Tubersol [Tuberculin, Ppd] Other (See Comments)      Reaction unknown   Doxycycline Rash and Other (See Comments)            Medications Prior to Admission  Medication Sig Dispense Refill   acetaminophen (TYLENOL) 650 MG CR tablet Take 1,300 mg by mouth every 8 (eight) hours as needed for pain.       albuterol (VENTOLIN HFA) 108 (90 Base) MCG/ACT inhaler Inhale 2 puffs into the lungs every 6 (six) hours as needed for wheezing or shortness of breath. 1 each 6   allopurinol (ZYLOPRIM) 100 MG tablet Take 200 mg by mouth 2 (two) times daily.       apixaban (ELIQUIS) 5 MG TABS tablet Take 1 tablet (5 mg total) by mouth 2 (two) times daily. 180 tablet 3   ARIPiprazole (ABILIFY) 5 MG tablet Take 1 tablet (5 mg total) by mouth at bedtime. 60 tablet 0   atorvastatin (LIPITOR) 40 MG tablet Take 1 tablet (40 mg total) by mouth daily. (Patient taking differently: Take 40 mg by mouth at bedtime.) 90 tablet 3   busPIRone (BUSPAR) 10 MG tablet Take 1  tablet (10 mg total) by mouth 3 (three) times daily. 270 tablet 0   cetirizine (ZYRTEC) 10 MG tablet Take 10 mg by mouth at bedtime.        Cholecalciferol (VITAMIN D3 PO) Take 2,000 Units by mouth daily.       cyanocobalamin (VITAMIN B12) 1000 MCG tablet Take 1,000 mcg by mouth daily.       dapagliflozin propanediol (FARXIGA) 10 MG TABS tablet TAKE ONE TABLET BY MOUTH BEFORE BREAKFAST 90 tablet 3   fenofibrate (TRICOR) 145 MG tablet Take 1 tablet (145 mg total) by mouth daily. 90 tablet 3   ferrous sulfate 324 (65 Fe) MG TBEC Take 324 mg by mouth daily with breakfast.        fluticasone (FLONASE) 50 MCG/ACT nasal spray Place 2 sprays into both nostrils daily.        Fluticasone-Umeclidin-Vilant (TRELEGY ELLIPTA) 200-62.5-25 MCG/ACT AEPB INHALE 1 PUFF BY MOUTH INTO LUNGS ONCE DAILY 60 each 11   icosapent Ethyl (VASCEPA) 1 g capsule Take 2 capsules (2 g total) by mouth 2 (two) times daily. 120 capsule 3   insulin degludec (TRESIBA FLEXTOUCH) 100 UNIT/ML FlexTouch Pen INJECT 40 UNITS INTO THE SKIN ONCE DAILY 10 mL 0   Lacosamide 150 MG TABS Take 1 tablet (150 mg total) by mouth 2 (two) times daily. 180 tablet 3   levETIRAcetam (KEPPRA) 500 MG tablet Take 1 tablet (500 mg total) by mouth 2 (two) times daily. 180 tablet 3   metFORMIN (GLUCOPHAGE-XR) 500 MG 24 hr tablet Take 4 tablets (2,000 mg total) by mouth daily. (Patient taking differently: Take 2,000 mg by mouth 2 (two) times daily with a meal.) 360 tablet 3   metoprolol succinate (TOPROL-XL) 50 MG 24 hr tablet Take 1 tablet (50 mg total) by mouth daily. 90 tablet 3   mirtazapine (REMERON) 15 MG tablet Take 1 tablet (15 mg total) by mouth at bedtime. 30 tablet 0  omeprazole (PRILOSEC) 20 MG capsule Take 20 mg by mouth every morning.       potassium chloride SA (KLOR-CON M) 20 MEQ tablet TAKE (2 TABLETS) IN THE MORNING, TAKE (1 TABLET) IN THE EVENING 90 tablet 3   prazosin (MINIPRESS) 2 MG capsule Take 2 capsules (4 mg total) by mouth  at bedtime. 60 capsule 2   pregabalin (LYRICA) 150 MG capsule Take 1 capsule (150 mg total) by mouth 3 (three) times daily. 270 capsule 3   repaglinide (PRANDIN) 2 MG tablet TAKE TWO TABLETS BY MOUTH TWICE DAILY BEFORE A meal (Patient taking differently: Take 2 mg by mouth 2 (two) times daily before a meal.) 120 tablet 0   spironolactone (ALDACTONE) 25 MG tablet Take 1 tablet (25 mg total) by mouth daily. 30 tablet 3   tirzepatide (MOUNJARO) 15 MG/0.5ML Pen INJECT 15 MG INTO THE SKIN ONCE A WEEK. (Patient taking differently: 15 mg once a week. Inject on Monday) 6 mL 0   torsemide (DEMADEX) 20 MG tablet Take 6 tablets (120 mg total) by mouth in the morning and at bedtime. 1080 tablet 3   traZODone (DESYREL) 100 MG tablet TAKE 2 TABLETS BY MOUTH AT BEDTIME. 120 tablet 0   venlafaxine XR (EFFEXOR-XR) 75 MG 24 hr capsule Take 3 capsules (225 mg total) by mouth daily with breakfast. 270 capsule 0   [DISCONTINUED] insulin aspart (NOVOLOG FLEXPEN) 100 UNIT/ML FlexPen INJECT 12 UNITS BEFORE MEALS, keep sugar TWO HOURS AFTER meals UNDER 180 AT least 15 mL 3              Home: Home Living Family/patient expects to be discharged to:: Private residence Living Arrangements: Spouse/significant other Available Help at Discharge: Family, Available 24 hours/day, Personal care attendant Type of Home: Apartment Home Access: Level entry Home Layout: One level Bathroom Shower/Tub: Engineer, manufacturing systems: Standard Bathroom Accessibility: Yes Home Equipment: Rollator (4 wheels), Electric scooter, Hospital bed (lift chair) Additional Comments: Has a PCA 4 days a week for about 4 hours a day (13 hour week); wears CPAP at night, no O2 at home  Lives With: Significant other   Functional History: Prior Function Prior Level of Function : Needs assist Physical Assist : ADLs (physical) ADLs (physical): Bathing, Dressing Mobility Comments: Rollator for household mobility (short distances); Geophysicist/field seismologist in community dwellings. ADLs Comments: aide assists with bathing/dressing, provides set up A for grooming tasks, light household chores,  utilizes transportation services. uses BSC does not go into bathroom; enjoys going into the community   Functional Status:  Mobility: Bed Mobility Overal bed mobility: Needs Assistance Bed Mobility: Supine to Sit Supine to sit: Supervision, HOB elevated, Used rails General bed mobility comments: increased time Transfers Overall transfer level: Needs assistance Equipment used: Rolling walker (2 wheels) Transfers: Sit to/from Stand, Bed to chair/wheelchair/BSC Sit to Stand: Mod assist, Min assist Bed to/from chair/wheelchair/BSC transfer type:: Step pivot Step pivot transfers: Min assist General transfer comment: STS from elevated EOB with mod A progressing to min A from the recliner. Min A for stability and R knee blocking to step to the recliner Ambulation/Gait Ambulation/Gait assistance: Min assist, +2 safety/equipment Gait Distance (Feet): 6 Feet Assistive device: Rolling walker (2 wheels) Gait Pattern/deviations: Knee hyperextension - right, Decreased stride length, Step-through pattern, Trunk flexed General Gait Details: deferred due to increased fatigue and need for chair follow for safety Gait velocity: decreased   ADL: ADL Overall ADL's : Needs assistance/impaired Eating/Feeding: Modified independent Grooming: Set up, Sitting Upper  Body Bathing: Minimal assistance, Sitting Lower Body Bathing: Maximal assistance, Sit to/from stand Upper Body Dressing : Moderate assistance Lower Body Dressing: Maximal assistance, Sit to/from stand Toilet Transfer: Moderate assistance, +2 for physical assistance, Stand-pivot, Rolling walker (2 wheels) Toileting- Clothing Manipulation and Hygiene: Maximal assistance Toileting - Clothing Manipulation Details (indicate cue type and reason): incontinent of urine at times; uses urinal  frequently Functional mobility during ADLs: Moderate assistance, +2 for safety/equipment (progressed to min A +2 for safety)   Cognition: Cognition Overall Cognitive Status: Impaired/Different from baseline Arousal/Alertness: Awake/alert Orientation Level: Oriented X4 Attention: Sustained Sustained Attention: Impaired Sustained Attention Impairment: Verbal basic Memory: Impaired Memory Impairment: Retrieval deficit Awareness: Appears intact Problem Solving: Impaired Problem Solving Impairment: Verbal basic Cognition Arousal: Alert Behavior During Therapy: WFL for tasks assessed/performed Overall Cognitive Status: Impaired/Different from baseline   Physical Exam: Blood pressure 126/68, pulse 87, temperature 97.6 F (36.4 C), temperature source Oral, resp. rate 13, height 6\' 2"  (1.88 m), weight (!) 169.2 kg, SpO2 92%.       General: , No apparent distress, morbidly obese HEENT: Head is normocephalic, atraumatic, PERRLA, EOMI, sclera anicteric, oral mucosa pink and moist, several teeth absent, wearing glasses neck: Supple  Heart: Reg rate and rhythm. No murmurs rubs or gallops, + Mild Chest wall tenderness  Chest: CTA bilaterally without wheezes, rales, or rhonchi; no distress +[place] 3L O2 Abdomen: Soft, mildly-tender, non-distended, bowel sounds positive. Extremities: No clubbing, cyanosis, 1+ bilateral lower extremity edema Psych: Pt's affect is appropriate. Pt is cooperative Skin: Dry dressing in place to  b/l ankles   Neuro:     Mental Status: AAOx3, memory intact, fund of knowledge appropriate Speech/[place]anguate: Naming and repetition intact, fluent, follows simple commands CRANIAL NERVES: II: PERRL. Visual fields full III, IV, VI: EOM intact, no gaze preference or deviation V: Altered sensation left face VII: no asymmetry VIII: normal hearing to speech IX, X: normal palatal elevation XI: 5/5 head turn and 5/5 shoulder shrug bilaterally XII: Tongue midline        MOTOR: RUE: 5/5 Deltoid, 5/5 Biceps, 5/5 Triceps,5/5 Grip LUE: 4/5 Deltoid, 4/5 Biceps, 4/5 Triceps, 4/5 Grip RLE: HF 5/5, KE 5/5, ADF 5/5, APF 5/5 LLE: HF 1-2/5, KE 1/5, ADF 2/5, APF 2/5     No hypertonia noted   REFLEXES: No ankle clonus noted   SENSORY: Altered sensation to light touch in left upper extremity and left lower extremity.   Coordination: Finger to nose mildly slowed in the left compared to the right   The left upper extremity IV looks okay     Lab Results Last 48 Hours        Results for orders placed or performed during the hospital encounter of 06/24/23 (from the past 48 hour(s))  Glucose, capillary     Status: Abnormal    Collection Time: 06/26/23 11:57 AM  Result Value Ref Range    Glucose-Capillary 108 (H) 70 - 99 mg/dL      Comment: Glucose reference range applies only to samples taken after fasting for at least 8 hours.  Glucose, capillary     Status: Abnormal    Collection Time: 06/26/23  4:04 PM  Result Value Ref Range    Glucose-Capillary 130 (H) 70 - 99 mg/dL      Comment: Glucose reference range applies only to samples taken after fasting for at least 8 hours.  Glucose, capillary     Status: Abnormal    Collection Time: 06/26/23  8:45 PM  Result Value Ref  Range    Glucose-Capillary 104 (H) 70 - 99 mg/dL      Comment: Glucose reference range applies only to samples taken after fasting for at least 8 hours.    Comment 1 Notify RN      Comment 2 Document in Chart    CBC with Differential/Platelet     Status: Abnormal    Collection Time: 06/27/23  4:10 AM  Result Value Ref Range    WBC 10.7 (H) 4.0 - 10.5 K/uL    RBC 4.29 4.22 - 5.81 MIL/uL    Hemoglobin 12.3 (L) 13.0 - 17.0 g/dL    HCT 81.1 (L) 91.4 - 52.0 %    MCV 90.2 80.0 - 100.0 fL    MCH 28.7 26.0 - 34.0 pg    MCHC 31.8 30.0 - 36.0 g/dL    RDW 78.2 (H) 95.6 - 15.5 %    Platelets 264 150 - 400 K/uL    nRBC 0.0 0.0 - 0.2 %    Neutrophils Relative % 70 %    Neutro Abs 7.6 1.7 - 7.7  K/uL    Lymphocytes Relative 16 %    Lymphs Abs 1.7 0.7 - 4.0 K/uL    Monocytes Relative 7 %    Monocytes Absolute 0.8 0.1 - 1.0 K/uL    Eosinophils Relative 5 %    Eosinophils Absolute 0.5 0.0 - 0.5 K/uL    Basophils Relative 1 %    Basophils Absolute 0.1 0.0 - 0.1 K/uL    Immature Granulocytes 1 %    Abs Immature Granulocytes 0.06 0.00 - 0.07 K/uL      Comment: Performed at [facility], [address] [place], [place] [address]  Comprehensive metabolic panel     Status: Abnormal    Collection Time: 06/27/23  4:10 AM  Result Value Ref Range    Sodium 138 135 - 145 mmol/L    Potassium 3.2 (L) 3.5 - 5.1 mmol/L    Chloride 99 98 - 111 mmol/L    CO2 31 22 - 32 mmol/L    Glucose, Bld 139 (H) 70 - 99 mg/dL      Comment: Glucose reference range applies only to samples taken after fasting for at least 8 hours.    BUN 23 8 - 23 mg/dL    Creatinine, Ser 6.57 (H) 0.61 - 1.24 mg/dL    Calcium 8.4 (L) 8.9 - 10.3 mg/dL    Total Protein 6.2 (L) 6.5 - 8.1 g/dL    Albumin 3.2 (L) 3.5 - 5.0 g/dL    AST 18 15 - 41 U/L    ALT 17 0 - 44 U/L    Alkaline Phosphatase 42 38 - 126 U/L    Total Bilirubin 0.4 <1.2 mg/dL    GFR, Estimated 57 (L) >60 mL/min      Comment: (NOTE) Calculated using the CKD-EPI Creatinine Equation (2021)      Anion gap 8 5 - 15      Comment: Performed at [facility], [address] [place], [place] [address]  Phosphorus     Status: None    Collection Time: 06/27/23  4:10 AM  Result Value Ref Range    Phosphorus 3.3 2.5 - 4.6 mg/dL      Comment: Performed at [facility], [address] [place], [place] [address]  Magnesium     Status: Abnormal    Collection Time: 06/27/23  4:10 AM  Result Value Ref Range  Magnesium 2.5 (H) 1.7 - 2.4 mg/dL      Comment: Performed at [facility], [address], [place], [place] [address]  Glucose, capillary     Status: Abnormal    Collection Time: 06/27/23  8:15 AM  Result Value Ref Range     Glucose-Capillary 136 (H) 70 - 99 mg/dL      Comment: Glucose reference range applies only to samples taken after fasting for at least 8 hours.  Glucose, capillary     Status: Abnormal    Collection Time: 06/27/23 11:19 AM  Result Value Ref Range    Glucose-Capillary 138 (H) 70 - 99 mg/dL      Comment: Glucose reference range applies only to samples taken after fasting for at least 8 hours.  Glucose, capillary     Status: Abnormal    Collection Time: 06/27/23  4:17 PM  Result Value Ref Range    Glucose-Capillary 107 (H) 70 - 99 mg/dL      Comment: Glucose reference range applies only to samples taken after fasting for at least 8 hours.  Glucose, capillary     Status: None    Collection Time: 06/27/23  9:16 PM  Result Value Ref Range    Glucose-Capillary 94 70 - 99 mg/dL      Comment: Glucose reference range applies only to samples taken after fasting for at least 8 hours.  CBC with Differential/Platelet     Status: Abnormal    Collection Time: 06/28/23  5:51 AM  Result Value Ref Range    WBC 10.4 4.0 - 10.5 K/uL    RBC 4.52 4.22 - 5.81 MIL/uL    Hemoglobin 12.8 (L) 13.0 - 17.0 g/dL    HCT 44.0 10.2 - 72.5 %    MCV 88.7 80.0 - 100.0 fL    MCH 28.3 26.0 - 34.0 pg    MCHC 31.9 30.0 - 36.0 g/dL    RDW 36.6 (H) 44.0 - 15.5 %    Platelets 291 150 - 400 K/uL    nRBC 0.0 0.0 - 0.2 %    Neutrophils Relative % 67 %    Neutro Abs 7.0 1.7 - 7.7 K/uL    Lymphocytes Relative 20 %    Lymphs Abs 2.1 0.7 - 4.0 K/uL    Monocytes Relative 8 %    Monocytes Absolute 0.8 0.1 - 1.0 K/uL    Eosinophils Relative 4 %    Eosinophils Absolute 0.4 0.0 - 0.5 K/uL    Basophils Relative 0 %    Basophils Absolute 0.0 0.0 - 0.1 K/uL    Immature Granulocytes 1 %    Abs Immature Granulocytes 0.07 0.00 - 0.07 K/uL      Comment: Performed at [facility], [address], [place], [place] [address]  Comprehensive metabolic panel     Status: Abnormal    Collection Time: 06/28/23  5:51 AM  Result Value Ref  Range    Sodium 139 135 - 145 mmol/L    Potassium 3.6 3.5 - 5.1 mmol/L    Chloride 101 98 - 111 mmol/L    CO2 29 22 - 32 mmol/L    Glucose, Bld 91 70 - 99 mg/dL      Comment: Glucose reference range applies only to samples taken after fasting for at least 8 hours.    BUN 23 8 - 23 mg/dL    Creatinine, Ser 5.95 0.61 - 1.24 mg/dL    Calcium 8.7 (L) 8.9 - 10.3  mg/dL    Total Protein 6.6 6.5 - 8.1 g/dL    Albumin 3.4 (L) 3.5 - 5.0 g/dL    AST 16 15 - 41 U/L    ALT 16 0 - 44 U/L    Alkaline Phosphatase 44 38 - 126 U/L    Total Bilirubin 0.7 <1.2 mg/dL    GFR, Estimated >33 >29 mL/min      Comment: (NOTE) Calculated using the CKD-EPI Creatinine Equation (2021)      Anion gap 9 5 - 15      Comment: Performed at [facility], [address] [place], [place] [address]  Phosphorus     Status: None    Collection Time: 06/28/23  5:51 AM  Result Value Ref Range    Phosphorus 3.4 2.5 - 4.6 mg/dL      Comment: Performed at [facility], [address], [place], [place] [address]  Magnesium     Status: Abnormal    Collection Time: 06/28/23  5:51 AM  Result Value Ref Range    Magnesium 2.5 (H) 1.7 - 2.4 mg/dL      Comment: Performed at [facility], [address], [place], [place] [address]  Glucose, capillary     Status: Abnormal    Collection Time: 06/28/23  8:29 AM  Result Value Ref Range    Glucose-Capillary 122 (H) 70 - 99 mg/dL      Comment: Glucose reference range applies only to samples taken after fasting for at least 8 hours.      Imaging Results (Last 48 hours)  No results found.         Blood pressure 126/68, pulse 87, temperature 97.6 F (36.4 C), temperature source Oral, resp. rate 13, height 6\' 2"  (1.88 m), weight (!) 169.2 kg, SpO2 92%.   Medical Problem List and Plan: 1. Functional deficits secondary to acute ischemic left cerebellar infarct with hx of prior CVA             -patient may shower, please cover RLE skin breakdown              -ELOS/Goals: 16 to 20 days, PT OT supervision to min assist, SLP supervision MRI             -Admit to CIR 2.  Antithrombotics: -DVT/anticoagulation:  Pharmaceutical: Eliquis             -antiplatelet therapy: N/A 3. Chronic pain/Pain Management: Has spinal cord stimulator. Continue Tylenol prn.   --will d/c hydrocodone and monitor as denies pain at this time.  --continue robaxin prn  4. Mood/Behavior/Sleep: LCSW to follow for evaluation and support.              -antipsychotic agents: Abilify-->will change to bedtime as at home.              --chronic insomnia was being managed with Trazodone and Remeron--> d/c per neurology --has PTSD/Nightmares but sleeping well in hospital. Resume Prasozin 2 mg/HS if BP stable.  5. Neuropsych/cognition: This patient is capable of making decisions on his own behalf. 6. Skin/Wound Care: Routine pressure relief measures.              -Bilateral lower extremity venous stasis ulcers.  Continue current wound care with Aquacel and Xeroform 7. Fluids/Electrolytes/Nutrition: Monitor I/O. Check CMET in am. Recheck Mg and Phos level in am8.  8. HTN: BP stable. Resume Prazosin when able  --Wide complex tachycardia: No significant arrhthymias seen per  Dr. Tenny Craw. Plans on             --metoprolol 12.5 mg bid and can be increased to 25 mg bid.  9. T2DM with peripheral neuropathy: Hgb A1C-6.5 and well controlled. Monitor BS ac/hs and use SSI for elevated BS.              --continue insulin glargine 40 units daily with novolog 12 units TID 10. Chronic diastolic CHF: Added 1500 cc FR per recommendations. HH diet  --Daily weight and monitor for signs of overload--weight at admission 11/16- 373 lbs.  --evaluated by cards today-->BB increased to 25 mg BID, Demadex bid. Zaroxolyn X 1 dose --cards will follow for response in am.   11. Wide complex tachycardia/hx PAF: Started metoprolol 25mg  BID by cardiology. On Eliquis 12. Seizure d/o: Has been seizure free on Keppra and Vimpat  -->followed by Dr. Teresa Coombs.  13. Major depressive: PTA--was on Effexor, Abilify, Remeron and Trazodone.              --lethargy could be due to daytime Abilify-->will change to nights             --Remeron and trazodone on hold per neuro 14. GAD w/panic attacks/PTSD: Chronic issues onoging and followed by [facility].  --Was on Prazosin and Buspar.             --Mood stable on current dose Buspar. Monitor for now  15. Hypokalemia: Improved with supplementation. Recheck in am 16. Leucocytosis: WBC trending down-->monitor 17. OSA: Now compliant with CPAP but has been requiring 5 L oxygen per [facility].              --question OHS as contributing factor leading to hypersomnia. Wean oxygen as able.  18. COPD: On Trilegy at home -->on Breo/Incruse  Continue to wean off oxygen.   19. Neuropathy: Managed with spinal cord stimulator and Lyrica (Dr. Beverly Gust Shaw/[facility]).  20. Gout flare R great toe: On colchicine bid for 5 more days, resume allopurinol in 2 to 3 weeks 21. CKD 3A:BUN/Scr-38/1.31 at admission.  22. Morbid Obesity. Body mass index is 47.89 kg/m.             -Dietary counseling         Jacquelynn Cree, PA-C 06/28/2023   I have personally performed a face to face diagnostic evaluation of this patient and formulated the key components of the plan.  Additionally, I have personally reviewed laboratory data, imaging studies, as well as relevant notes and concur with the physician assistant's documentation above.   The patient's status has not changed from the original H&P.  Any changes in documentation from the acute care chart have been noted above.   Fanny Dance, MD, [place] [facility]

## 2023-06-28 NOTE — TOC Progression Note (Signed)
Transition of Care [facility]) - Progression Note    Patient Details  Name: Lawrence [name] MRN: [id number] Date of Birth: 04-05-1956  Transition of [facility]) CM/SW Contact  Gordy Clement, RN Phone Number: 06/28/2023, 12:58 PM  Clinical Narrative:    RNCM notified that patient was active with [facility] pta.  Liaison notified that patient will DC to CIR.         Expected Discharge Plan and Services         Expected Discharge Date: 06/28/23                                     Social Determinants of Health (SDOH) Interventions SDOH Screenings   Food Insecurity: Food Insecurity Present (06/25/2023)  Housing: Low Risk  (06/25/2023)  Transportation Needs: No Transportation Needs (06/25/2023)  Utilities: Not At Risk (06/25/2023)  Alcohol Screen: Low Risk  (06/08/2021)  Financial Resource Strain: Medium Risk (12/06/2021)   Received from [facility], [facility]  Physical Activity: Unknown (12/06/2021)   Received from [facility], [facility]  Social Connections: Unknown (12/18/2022)   Received from [facility], [facility]  Stress: No Stress Concern Present (01/21/2022)   Received from [facility], [facility]  Recent Concern: Stress - Stress Concern Present (11/19/2021)   Received from [facility]  Tobacco Use: Medium Risk (06/24/2023)    Readmission Risk Interventions    05/08/2023    4:37 PM 01/25/2023    4:23 PM 08/12/2021   10:14 AM  Readmission Risk Prevention Plan  Transportation Screening Complete Complete Complete  PCP or Specialist Appt within 3-5 Days  Complete   HRI or Home Care Consult  Complete   Social Work Consult for Recovery Care Planning/Counseling  Complete   Palliative Care Screening  Not Applicable   Medication Review [facility]) Complete Referral to Pharmacy Complete  PCP or Specialist appointment within 3-5 days of discharge Complete  Complete  HRI or Home Care Consult Complete  Complete  SW Recovery  Care/Counseling Consult Complete  Complete  Palliative Care Screening Complete  Not Applicable  Skilled Nursing Facility Not Applicable  Not Applicable

## 2023-06-28 NOTE — Progress Notes (Signed)
Pt's wife Fredderick Phenix, was called by this Clinical research associate, primary RN Leavy Cella, of room change to [facility] room 01.

## 2023-06-28 NOTE — Plan of Care (Signed)
  Problem: Education: Goal: Knowledge of disease or condition will improve Outcome: Progressing   Problem: Coping: Goal: Will verbalize positive feelings about self Outcome: Progressing Goal: Will identify appropriate support needs Outcome: Progressing   Problem: Health Behavior/Discharge Planning: Goal: Ability to manage health-related needs will improve Outcome: Progressing   Problem: Nutrition: Goal: Dietary intake will improve Outcome: Progressing   Problem: Education: Goal: Knowledge of disease or condition will improve Outcome: Progressing   Problem: Ischemic Stroke/TIA Tissue Perfusion: Goal: Complications of ischemic stroke/TIA will be minimized Outcome: Progressing   Problem: Coping: Goal: Will verbalize positive feelings about self Outcome: Progressing   Problem: Health Behavior/Discharge Planning: Goal: Ability to manage health-related needs will improve Outcome: Progressing

## 2023-06-29 ENCOUNTER — Inpatient Hospital Stay (HOSPITAL_COMMUNITY): Payer: No Typology Code available for payment source

## 2023-06-29 DIAGNOSIS — E1142 Type 2 diabetes mellitus with diabetic polyneuropathy: Secondary | ICD-10-CM | POA: Diagnosis not present

## 2023-06-29 DIAGNOSIS — E876 Hypokalemia: Secondary | ICD-10-CM

## 2023-06-29 DIAGNOSIS — I63542 Cerebral infarction due to unspecified occlusion or stenosis of left cerebellar artery: Secondary | ICD-10-CM | POA: Diagnosis not present

## 2023-06-29 DIAGNOSIS — G4733 Obstructive sleep apnea (adult) (pediatric): Secondary | ICD-10-CM | POA: Diagnosis not present

## 2023-06-29 DIAGNOSIS — G4700 Insomnia, unspecified: Secondary | ICD-10-CM

## 2023-06-29 LAB — MAGNESIUM: Magnesium: 2.3 mg/dL (ref 1.7–2.4)

## 2023-06-29 LAB — CBC WITH DIFFERENTIAL/PLATELET
Abs Immature Granulocytes: 0.09 10*3/uL — ABNORMAL HIGH (ref 0.00–0.07)
Basophils Absolute: 0.1 10*3/uL (ref 0.0–0.1)
Basophils Relative: 0 %
Eosinophils Absolute: 0.5 10*3/uL (ref 0.0–0.5)
Eosinophils Relative: 4 %
HCT: 42.8 % (ref 39.0–52.0)
Hemoglobin: 14.1 g/dL (ref 13.0–17.0)
Immature Granulocytes: 1 %
Lymphocytes Relative: 16 %
Lymphs Abs: 1.8 10*3/uL (ref 0.7–4.0)
MCH: 28.7 pg (ref 26.0–34.0)
MCHC: 32.9 g/dL (ref 30.0–36.0)
MCV: 87 fL (ref 80.0–100.0)
Monocytes Absolute: 1 10*3/uL (ref 0.1–1.0)
Monocytes Relative: 8 %
Neutro Abs: 8.3 10*3/uL — ABNORMAL HIGH (ref 1.7–7.7)
Neutrophils Relative %: 71 %
Platelets: 312 10*3/uL (ref 150–400)
RBC: 4.92 MIL/uL (ref 4.22–5.81)
RDW: 16.4 % — ABNORMAL HIGH (ref 11.5–15.5)
WBC: 11.7 10*3/uL — ABNORMAL HIGH (ref 4.0–10.5)
nRBC: 0 % (ref 0.0–0.2)

## 2023-06-29 LAB — GLUCOSE, CAPILLARY
Glucose-Capillary: 125 mg/dL — ABNORMAL HIGH (ref 70–99)
Glucose-Capillary: 133 mg/dL — ABNORMAL HIGH (ref 70–99)
Glucose-Capillary: 125 mg/dL — ABNORMAL HIGH (ref 70–99)
Glucose-Capillary: 204 mg/dL — ABNORMAL HIGH (ref 70–99)

## 2023-06-29 LAB — COMPREHENSIVE METABOLIC PANEL
ALT: 19 U/L (ref 0–44)
AST: 18 U/L (ref 15–41)
Albumin: 3.7 g/dL (ref 3.5–5.0)
Alkaline Phosphatase: 47 U/L (ref 38–126)
Anion gap: 9 (ref 5–15)
BUN: 25 mg/dL — ABNORMAL HIGH (ref 8–23)
CO2: 31 mmol/L (ref 22–32)
Calcium: 8.8 mg/dL — ABNORMAL LOW (ref 8.9–10.3)
Chloride: 97 mmol/L — ABNORMAL LOW (ref 98–111)
Creatinine, Ser: 1.27 mg/dL — ABNORMAL HIGH (ref 0.61–1.24)
GFR, Estimated: >60 mL/min (ref >60)
Glucose, Bld: 115 mg/dL — ABNORMAL HIGH (ref 70–99)
Potassium: 3.1 mmol/L — ABNORMAL LOW (ref 3.5–5.1)
Sodium: 137 mmol/L (ref 135–145)
Total Bilirubin: 0.7 mg/dL (ref <1.2)
Total Protein: 7.1 g/dL (ref 6.5–8.1)

## 2023-06-29 LAB — PHOSPHORUS: Phosphorus: 3.7 mg/dL (ref 2.5–4.6)

## 2023-06-29 MED ORDER — COLCHICINE 0.3 MG HALF TABLET
0.3000 mg | ORAL_TABLET | Freq: Two times a day (BID) | ORAL | Status: DC
  Administered 2023-06-29 – 2023-06-30 (×2): 0.3 mg via ORAL
  Filled 2023-06-29 (×2): qty 1

## 2023-06-29 MED ORDER — INSULIN ASPART 100 UNIT/ML IJ SOLN
0.0000 [IU] | Freq: Three times a day (TID) | INTRAMUSCULAR | Status: DC
  Administered 2023-06-29: 1 [IU] via SUBCUTANEOUS
  Administered 2023-06-30: 2 [IU] via SUBCUTANEOUS
  Administered 2023-06-30: 1 [IU] via SUBCUTANEOUS
  Administered 2023-06-30 – 2023-07-02 (×3): 2 [IU] via SUBCUTANEOUS
  Administered 2023-07-03: 1 [IU] via SUBCUTANEOUS
  Administered 2023-07-03: 2 [IU] via SUBCUTANEOUS
  Administered 2023-07-06 (×2): 1 [IU] via SUBCUTANEOUS
  Administered 2023-07-11 – 2023-07-12 (×2): 2 [IU] via SUBCUTANEOUS

## 2023-06-29 MED ORDER — POTASSIUM CHLORIDE CRYS ER 20 MEQ PO TBCR
20.0000 meq | EXTENDED_RELEASE_TABLET | Freq: Every day | ORAL | Status: DC
  Administered 2023-06-29: 20 meq via ORAL
  Filled 2023-06-29: qty 1

## 2023-06-29 MED ORDER — POTASSIUM CHLORIDE CRYS ER 20 MEQ PO TBCR
40.0000 meq | EXTENDED_RELEASE_TABLET | Freq: Every day | ORAL | Status: DC
  Administered 2023-06-30: 40 meq via ORAL
  Filled 2023-06-29: qty 2

## 2023-06-29 MED ORDER — POTASSIUM CHLORIDE CRYS ER 20 MEQ PO TBCR: 20.0000 meq | EXTENDED_RELEASE_TABLET | Freq: Two times a day (BID) | ORAL | Status: DC

## 2023-06-29 MED ORDER — INSULIN ASPART 100 UNIT/ML IJ SOLN
0.0000 [IU] | Freq: Every day | INTRAMUSCULAR | Status: DC
  Administered 2023-06-29: 2 [IU] via SUBCUTANEOUS

## 2023-06-29 NOTE — Progress Notes (Signed)
Inpatient Rehabilitation Center Individual Statement of Services  Patient Name:  Lawrence [name]  Date:  06/29/2023  Welcome to the Inpatient Rehabilitation Center.  Our goal is to provide you with an individualized program based on your diagnosis and situation, designed to meet your specific needs.  With this comprehensive rehabilitation program, you will be expected to participate in at least 3 hours of rehabilitation therapies Monday-Friday, with modified therapy programming on the weekends.  Your rehabilitation program will include the following services:  Physical Therapy (PT), Occupational Therapy (OT), Speech Therapy (ST), 24 hour per day rehabilitation nursing, Neuropsychology, Care Coordinator, Rehabilitation Medicine, Nutrition Services, and Pharmacy Services  Weekly team conferences will be held on Wednesday to discuss your progress.  Your Inpatient Rehabilitation Care Coordinator will talk with you frequently to get your input and to update you on team discussions.  Team conferences with you and your family in attendance may also be held.  Expected length of stay: 16-20 days  Overall anticipated outcome: CGA-supervision level  Depending on your progress and recovery, your program may change. Your Inpatient Rehabilitation Care Coordinator will coordinate services and will keep you informed of any changes. Your Inpatient Rehabilitation Care Coordinator's name and contact numbers are listed  below.  The following services may also be recommended but are not provided by the Inpatient Rehabilitation Center:   Home Health Rehabiltiation Services Outpatient Rehabilitation Services    Arrangements will be made to provide these services after discharge if needed.  Arrangements include referral to agencies that provide these services.  Your insurance has been verified to be:  [employer] & Medicaid Your primary doctor is:  Cala Bradford  Pertinent information will be shared  with your doctor and your insurance company.  Inpatient Rehabilitation Care Coordinator:  Dossie Der, [place] [phone] or Luna [place]  Information discussed with and copy given to patient by: Lucy Chris, 06/29/2023, 10:45 AM

## 2023-06-29 NOTE — Progress Notes (Signed)
Rounding Note    Patient Name: Lawrence [name] Date of Encounter: 06/29/2023  [facility] Cardiologist: Armanda Magic, MD / CHF service  Subjective   Pt in bed Breathing is fair     Inpatient Medications    Scheduled Meds:  apixaban  5 mg Oral BID   ARIPiprazole  5 mg Oral Q supper   busPIRone  10 mg Oral TID   cholecalciferol  2,000 Units Oral Daily   colchicine  0.3 mg Oral BID   cyanocobalamin  1,000 mcg Oral Daily   fenofibrate  160 mg Oral Daily   ferrous sulfate  325 mg Oral Q breakfast   fluticasone furoate-vilanterol  1 puff Inhalation Daily   And   umeclidinium bromide  1 puff Inhalation Daily   icosapent Ethyl  2 g Oral BID   insulin aspart  0-5 Units Subcutaneous QHS   insulin aspart  0-9 Units Subcutaneous TID WC   insulin aspart  12 Units Subcutaneous TID WC   insulin glargine-yfgn  40 Units Subcutaneous Q24H   lacosamide  150 mg Oral BID   levETIRAcetam  500 mg Oral BID   pantoprazole  40 mg Oral Daily   potassium chloride  20 mEq Oral Q supper   [START ON 06/30/2023] potassium chloride  40 mEq Oral Q breakfast   pregabalin  150 mg Oral TID   rosuvastatin  20 mg Oral Q supper   spironolactone  25 mg Oral Daily   torsemide  120 mg Oral BID   venlafaxine XR  225 mg Oral Q breakfast   Continuous Infusions:   PRN Meds: acetaminophen, alum & mag hydroxide-simeth, bisacodyl, diphenhydrAMINE, guaiFENesin-dextromethorphan, melatonin, methocarbamol, prochlorperazine **OR** prochlorperazine **OR** prochlorperazine, sodium phosphate   Vital Signs    Vitals:   06/29/23 1340 06/29/23 1416 06/29/23 1418 06/29/23 1420  BP: (!) 146/70 (!) 148/65 (!) 155/97 (!) 122/92  Pulse: (!) 43 (!) 45 (!) 47 (!) 42  Resp: 19     Temp: 97.6 F (36.4 C)     TempSrc:      SpO2: 94% 97%    Weight:    (!) 153.5 kg  Height:        Intake/Output Summary (Last 24 hours) at 06/29/2023 1851 Last data filed at 06/29/2023 1820 Gross per 24 hour  Intake 720 ml   Output 4750 ml  Net -4030 ml      06/29/2023    2:20 PM 06/29/2023    5:00 AM 06/28/2023    9:18 PM  Last 3 Weights  Weight (lbs) 338 lb 8 oz 339 lb 4.6 oz 353 lb 6.4 oz  Weight (kg) 153.543 kg 153.9 kg 160.3 kg      Telemetry    No tele    - Personally Reviewed  ECG    No new  - Personally Reviewed  Physical Exam  \ GEN: Morbidly obese 67 yo in no  acute distress.  Neck: Neck full  Cardiac: RRR, no murmurs, Respiratory: Clear to auscultation GI: Obese  Not tense   MS: Tr to 1+ edema;   Labs    High Sensitivity Troponin:  No results for input(s): "TROPONINIHS" in the last 720 hours.   Chemistry Recent Labs  Lab 06/27/23 0410 06/28/23 0551 06/29/23 0515  NA 138 139 137  K 3.2* 3.6 3.1*  CL 99 101 97*  CO2 31 29 31   GLUCOSE 139* 91 115*  BUN 23 23 25*  CREATININE 1.37* 1.04 1.27*  CALCIUM 8.4* 8.7*  8.8*  MG 2.5* 2.5* 2.3  PROT 6.2* 6.6 7.1  ALBUMIN 3.2* 3.4* 3.7  AST 18 16 18   ALT 17 16 19   ALKPHOS 42 44 47  BILITOT 0.4 0.7 0.7  GFRNONAA 57* >60 >60  ANIONGAP 8 9 9     Lipids  Recent Labs  Lab 06/25/23 0426  CHOL 151  TRIG 209*  HDL 30*  LDLCALC 79  CHOLHDL 5.0    Hematology Recent Labs  Lab 06/27/23 0410 06/28/23 0551 06/29/23 0515  WBC 10.7* 10.4 11.7*  RBC 4.29 4.52 4.92  HGB 12.3* 12.8* 14.1  HCT 38.7* 40.1 42.8  MCV 90.2 88.7 87.0  MCH 28.7 28.3 28.7  MCHC 31.8 31.9 32.9  RDW 17.0* 16.4* 16.4*  PLT 264 291 312   Thyroid  Recent Labs  Lab 06/24/23 2328  TSH 1.561    BNPNo results for input(s): "BNP", "PROBNP" in the last 168 hours.  DDimer No results for input(s): "DDIMER" in the last 168 hours.   Radiology    No results found.  Cardiac Studies  1. Technically difficult study.   2. Left ventricular ejection fraction, by estimation, is 50 to 55%. The  left ventricle has low normal function. Left ventricular endocardial  border not optimally defined to evaluate regional wall motion. The left  ventricular internal  cavity size was  dilated. Left ventricular diastolic function could not be evaluated.   3. Right ventricular systolic function is normal. The right ventricular  size is normal. Mildly increased right ventricular wall thickness.   4. There is no evidence of cardiac tamponade.   5. The mitral valve is grossly normal. No evidence of mitral valve  regurgitation. No evidence of mitral stenosis.   6. The aortic valve was not well visualized. Aortic valve regurgitation  is not visualized. But based on doppler parameter cannot rule out mild  (per max velocity and MG) to moderate aortic stenosis (dimensional index).   7. Aortic ascending aorta is structurally normal, with no evidence of  dilitation.   8. Agitated saline contrast bubble study was inconclusive.   Conclusion(s)/Recommendation(s): No intracardiac source of embolism  detected on this transthoracic study. Consider a transesophageal  echocardiogram to exclude cardiac source of embolism if clinically  indicated.     Patient Profile      [name] is a 67 y.o. male with a hx of paroxysmal atrial fibrillation, aortic stenosis status post TAVR, chronic HFpEF, PVCs, seizures, Hypertension, hyperlipidemia, type 2 diabetes, OSA uses BiPAP, who is being seen 06/26/2023 for the evaluation of wide-complex tachycardia at the request of Dr. Thedore Mins.   Assessment & Plan    1  Rhythm   Pt followed on inpatient service for SR and PVCs     Pulse noted to be slow today    OK to back down on b blocker a bit     Will set up Pt remains ins SR with PVCs    ON b blocker  Will set up for Zio patch to wear while on service     2  HFpEF    Volume status is difficult given pt's body habitus But, he complains of SOB and he is still requiring O2 [place]   He was not on piror to hospitalization.  Yesterday pt given2.5 mg zaroxylyn along with torsemide   Appears to have had a significant response   Clinically he does appear to be a little less volume  overloaded though again still on O2. Wlll reassess in am  For questions or updates, please contact [facility] Please consult [email or web address] for contact info under        Signed, Dietrich Pates, MD  06/29/2023, 6:51 PM

## 2023-06-29 NOTE — Evaluation (Signed)
Speech Language Pathology Assessment and Plan  Patient Details  Name: Lawrence [name] MRN: [id number] Date of Birth: [birth year]  SLP Diagnosis: Cognitive Impairments;Dysphagia;Aphasia  Rehab Potential: Excellent ELOS: 16-20 days    Today's Date: 06/29/2023 SLP Individual Time: 5366-4403 SLP Individual Time Calculation (min): 56 min   Hospital Problem: Principal Problem:   Acute stroke due to occlusion of left cerebellar artery (HCC) Active Problems:   Primary hypertension   Type 2 diabetes mellitus with diabetic polyneuropathy, with long-term current use of insulin (HCC)  Past Medical History:  Past Medical History:  Diagnosis Date   Acquired dilation of ascending aorta and aortic root (HCC)    40mm by echo 01/2021   Adenomatous colon polyp 2007   Anemia    Anxiety    Asthma    BPH without obstruction/lower urinary tract symptoms 02/22/2017   Chronic diastolic (congestive) heart failure (HCC)    Chronic venous stasis 03/07/2019   COPD (chronic obstructive pulmonary disease) (HCC)    Coronary artery calcification seen on CAT scan    Depression    Diabetic neuropathy (HCC) 09/11/2019   History of colon polyps 08/24/2018   Hypertension    Morbid obesity (HCC)    OSA (obstructive sleep apnea)    Pain due to onychomycosis of toenails of both feet 09/11/2019   Peripheral neuropathy 02/22/2017   Primary osteoarthritis, left shoulder 03/05/2017   PTSD (post-traumatic stress disorder)    Pure hypercholesterolemia    QT prolongation 03/07/2019   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 Lynnette Caffey and Dr. Laneta Simmers   Seizures [facility])    Severe aortic stenosis    Sinus tachycardia 03/07/2019   Sleep apnea    CPAP   Type 2 diabetes mellitus with vascular disease (HCC) 09/11/2019   Past Surgical History:  Past Surgical History:  Procedure Laterality Date   ENDOVENOUS ABLATION SAPHENOUS VEIN W/ LASER Right 08/20/2020    endovenous laser ablation right greater saphenous vein by Cari Caraway MD    ENDOVENOUS ABLATION SAPHENOUS VEIN W/ LASER Left 11/16/2022   endovenous laser ablation left greater saphenous vein by Cari Caraway MD   INTRAOPERATIVE TRANSTHORACIC ECHOCARDIOGRAM N/A 02/07/2023   Procedure: INTRAOPERATIVE TRANSTHORACIC ECHOCARDIOGRAM;  Surgeon: Orbie Pyo, MD;  Location: [facility] INVASIVE CV LAB;  Service: Open Heart Surgery;  Laterality: N/A;   JOINT REPLACEMENT     left knee replacement x 2   KNEE ARTHROSCOPY Bilateral    LEFT HEART CATH AND CORONARY ANGIOGRAPHY N/A 01/17/2018   Procedure: LEFT HEART CATH AND CORONARY ANGIOGRAPHY;  Surgeon: Marykay Lex, MD;  Location: [facility] INVASIVE CV LAB;  Service: Cardiovascular;  Laterality: N/A;   PRESSURE SENSOR/CARDIOMEMS N/A 08/26/2021   Procedure: PRESSURE SENSOR/CARDIOMEMS;  Surgeon: Laurey Morale, MD;  Location: [facility] INVASIVE CV LAB;  Service: Cardiovascular;  Laterality: N/A;   RIGHT HEART CATH N/A 08/26/2021   Procedure: RIGHT HEART CATH;  Surgeon: Laurey Morale, MD;  Location: [facility] INVASIVE CV LAB;  Service: Cardiovascular;  Laterality: N/A;   RIGHT HEART CATH AND CORONARY ANGIOGRAPHY N/A 01/26/2023   Procedure: RIGHT HEART CATH AND CORONARY ANGIOGRAPHY;  Surgeon: Orbie Pyo, MD;  Location: [facility] INVASIVE CV LAB;  Service: Cardiovascular;  Laterality: N/A;   TEE WITHOUT CARDIOVERSION N/A 08/26/2021   Procedure: TRANSESOPHAGEAL ECHOCARDIOGRAM (TEE);  Surgeon: Laurey Morale, MD;  Location: [facility] ENDOSCOPY;  Service: Cardiovascular;  Laterality: N/A;   TOOTH EXTRACTION N/A 02/03/2023   Procedure: DENTAL RESTORATION/EXTRACTIONS;  Surgeon: [name], DMD;  Location: [facility] OR;  Service: Oral Surgery;  Laterality: N/A;   TRANSCATHETER AORTIC VALVE REPLACEMENT, TRANSFEMORAL Right 02/07/2023   Procedure: Transcatheter Aortic Valve Replacement, Transfemoral;  Surgeon: Orbie Pyo, MD;  Location: [facility] INVASIVE CV LAB;  Service: Open Heart Surgery;   Laterality: Right;   UMBILICAL HERNIA REPAIR      Assessment / Plan / Recommendation Clinical Impression  Lawrence [name] is a 67 year old male with history of CAF-on eliquis, CVA w/residual L-HP, T2DM with neuropathy, MDD, GAD, chronic pain s/p spinal cord stimulator, seizure d/o, COPD, OSA, TAVR, chronic diastolic CHF, SOB w/activity,chronic stasis ulcers,  morbid obesity-BMI 48;  who was admitted on 06/25/23 after found unresponsive in his recliner.  He aroused with sternal rub but was noted to have slurred speech with left sided weakness.  Dr. Wilford Corner questioned recurdescence of old stroke symptoms, unwitnessed seizure or hypercarbia with narcosis as cause. MRI brain revealed small left inferior infarct felt to be silent and Dr. Pearlean Brownie recommended continuing Eliquis bid. Acute respiratory failure treated with supplemental oxygen and he has been refusing CPAP. He has had multiple recent admissions and has aide 4 days/week to assist with B/D and home management. Patient was admitted to CIR on 06/28/23  COGNISTAT administered and revealed strengths in orientation, attention, calculations, and executive function. Mild impairments observed in areas of memory. At baseline, pt reports independence with medication management and cooking. He reports completing finances in conjunction with his wife. Pt states that he has not noticed changes in cognition since CVA. Informally, pt interpreted therapy schedule, verbalized recent and past medical hx, and recalled this clinician's name throughout session. Mild impairments observed in attention as pt required intermittent redirection to task with ability to redirect back.   Pt reported word finding deficits post recent CVA. Language portions of COGNISTAT completed with no overt findings. There were no observed instances of word finding deficits observed during session.  Bedside Swallow Evaluation completed due to pt report of globus sensation at baseline occurring  ~1-2x a day. Pt consumed trials of solids and thin liquids. He consumed thin liquids via single and consecutive sips with no s/sx pen/asp. He consumed solids with adequate bolus formation, transit, and deglutition with no overt s/sx pen/asp. Pt may benefit from GI f/u for globus sensation. SLP recommending continuation of regular solids and thin liquids.   Skilled Therapeutic Interventions          BSE, informal assessment measures, and COGNISTAT administered. Please see full report for additional details.     SLP Assessment  Patient will need skilled Speech Lanaguage Pathology Services during CIR admission    Recommendations  Recommended Consults: Consider GI evaluation SLP Diet Recommendations: Age appropriate regular solids;Thin Liquid Administration via: Straw;Cup Medication Administration: Whole meds with liquid Supervision: Patient able to self feed Compensations: Slow rate;Small sips/bites Postural Changes and/or Swallow Maneuvers: Seated upright 90 degrees;Upright 30-60 min after meal Oral Care Recommendations: Oral care BID Patient destination: Home Follow up Recommendations: None Equipment Recommended: None recommended by SLP    SLP Frequency 1 to 3 out of 7 days   SLP Duration  SLP Intensity  SLP Treatment/Interventions 16-20 days  Minumum of 1-2 x/day, 30 to 90 minutes  Cognitive remediation/compensation;Internal/external aids;Speech/Language facilitation;Therapeutic Activities;Functional tasks;Patient/family education;Dysphagia/aspiration precaution training    Pain Pain Assessment Pain Scale: 0-10 Pain Score: 0-No pain  Prior Functioning Cognitive/Linguistic Baseline: Within functional limits Type of Home: Apartment  Lives With: Significant other Available Help at Discharge: Family;Available  24 hours/day;Personal care attendant  SLP Evaluation Cognition Overall Cognitive Status: Impaired/Different from baseline Arousal/Alertness: Awake/alert Orientation  Level: Oriented X4 Year: 2024 Month: November Day of Week: Correct Attention: Sustained Sustained Attention: Impaired Sustained Attention Impairment: Verbal basic Memory: Impaired Memory Impairment: Retrieval deficit Awareness: Impaired Awareness Impairment: Emergent impairment Problem Solving: Impaired Problem Solving Impairment: Functional complex Executive Function: Initiating;Self Correcting Initiating: Appears intact Self Correcting: Impaired Self Correcting Impairment: Functional complex Safety/Judgment: Appears intact  Comprehension Auditory Comprehension Overall Auditory Comprehension: Appears within functional limits for tasks assessed Expression Expression Primary Mode of Expression: Verbal Verbal Expression Overall Verbal Expression: Appears within functional limits for tasks assessed Level of Generative/Spontaneous Verbalization: Conversation Naming: No impairment Other Verbal Expression Comments: Pt c/o word finding deficits post CVA Written Expression Dominant Hand: Right Oral Motor Oral Motor/Sensory Function Overall Oral Motor/Sensory Function: Within functional limits Motor Speech Overall Motor Speech: Appears within functional limits for tasks assessed  Care Tool Care Tool Cognition Ability to hear (with hearing aid or hearing appliances if normally used Ability to hear (with hearing aid or hearing appliances if normally used): 0. Adequate - no difficulty in normal conservation, social interaction, listening to TV   Expression of Ideas and Wants Expression of Ideas and Wants: 4. Without difficulty (complex and basic) - expresses complex messages without difficulty and with speech that is clear and easy to understand   Understanding Verbal and Non-Verbal Content Understanding Verbal and Non-Verbal Content: 4. Understands (complex and basic) - clear comprehension without cues or repetitions  Memory/Recall Ability Memory/Recall Ability : Current season;That  he or she is in a hospital/hospital unit   Bedside Swallowing Assessment General Diet Prior to this Study: Regular;Thin liquids (Level 0) Temperature Spikes Noted: No Respiratory Status: Room air History of Recent Intubation: No Behavior/Cognition: Alert;Cooperative;Pleasant mood Oral Cavity - Dentition: Missing dentition Self-Feeding Abilities: Able to feed self Patient Positioning: Upright in bed Baseline Vocal Quality: Normal Volitional Cough: Strong Volitional Swallow: Able to elicit  Oral Care Assessment Oral Assessment  (WDL): Within Defined Limits Lips: Symmetrical Teeth: Missing (Comment) Tongue: Pink Mucous Membrane(s): Moist Saliva: Moist, saliva free flowing Level of Consciousness: Alert Is patient on any of following O2 devices?: None of the above Nutritional status: No high risk factors Oral Assessment Risk : Low Risk Ice Chips Ice chips: Not tested Thin Liquid Thin Liquid: Within functional limits Nectar Thick Nectar Thick Liquid: Not tested Honey Thick Honey Thick Liquid: Not tested Puree Puree: Not tested Solid Solid: Within functional limits Other Comments: Pt c/o globus sensation at baseline occuring ~1-2x a day BSE Assessment Suspected Esophageal Findings Suspected Esophageal Findings: Globus sensation Risk for Aspiration Impact on safety and function: No limitations Other Related Risk Factors: Previous CVA  Short Term Goals: Week 1: SLP Short Term Goal 1 (Week 1): Patient will solve moderately complex problems with 90% acc given sup A SLP Short Term Goal 2 (Week 1): Patient will recall novel information given a 10- 15 minute delay with 90% acc with sup A SLP Short Term Goal 3 (Week 1): Patient will selectively attend to task in a mildly distracting environment for >25 minutes with sup A SLP Short Term Goal 4 (Week 1): Patient will utilize word finding strategies at the sentence level with sup A  Refer to Care Plan for Long Term  Goals  Recommendations for other services: None   Discharge Criteria: Patient will be discharged from SLP if patient refuses treatment 3 consecutive times without medical reason, if treatment goals not met, if there  is a change in medical status, if patient makes no progress towards goals or if patient is discharged from hospital.  The above assessment, treatment plan, treatment alternatives and goals were discussed and mutually agreed upon: by patient  Renaee Munda 06/29/2023, 12:39 PM

## 2023-06-29 NOTE — Progress Notes (Signed)
Inpatient Rehabilitation  Patient information reviewed and entered into eRehab system by Oyuki Hogan M. Eri Mcevers, M.A., CCC/SLP, PPS Coordinator.  Information including medical coding, functional ability and quality indicators will be reviewed and updated through discharge.    

## 2023-06-29 NOTE — Plan of Care (Signed)
  Problem: RH Expression Communication Goal: LTG Patient will increase word finding of common (SLP) Description: LTG:  Patient will increase word finding of common objects/daily info/abstract thoughts with cues using compensatory strategies (SLP). Flowsheets (Taken 06/29/2023 1245) LTG: Patient will increase word finding of common (SLP): Independent

## 2023-06-29 NOTE — Evaluation (Signed)
Physical Therapy Assessment and Plan  Patient Details  Name: Lawrence [name] MRN: [id number] Date of Birth: Jan 02, 1956  PT Diagnosis: Abnormal posture, Abnormality of gait, Cognitive deficits, Difficulty walking, Dizziness and giddiness, Edema, Hemiparesis non-dominant, Impaired cognition, Impaired sensation, and Muscle weakness Rehab Potential: Fair ELOS: 16-20 days   Today's Date: 06/29/2023 PT Individual Time: 1300-1340 PT Individual Time Calculation (min): 40 min    Hospital Problem: Principal Problem:   Acute stroke due to occlusion of left cerebellar artery (HCC) Active Problems:   Primary hypertension   Type 2 diabetes mellitus with diabetic polyneuropathy, with long-term current use of insulin (HCC)   Past Medical History:  Past Medical History:  Diagnosis Date   Acquired dilation of ascending aorta and aortic root (HCC)    40mm by echo 01/2021   Adenomatous colon polyp 2007   Anemia    Anxiety    Asthma    BPH without obstruction/lower urinary tract symptoms 02/22/2017   Chronic diastolic (congestive) heart failure (HCC)    Chronic venous stasis 03/07/2019   COPD (chronic obstructive pulmonary disease) (HCC)    Coronary artery calcification seen on CAT scan    Depression    Diabetic neuropathy (HCC) 09/11/2019   History of colon polyps 08/24/2018   Hypertension    Morbid obesity (HCC)    OSA (obstructive sleep apnea)    Pain due to onychomycosis of toenails of both feet 09/11/2019   Peripheral neuropathy 02/22/2017   Primary osteoarthritis, left shoulder 03/05/2017   PTSD (post-traumatic stress disorder)    Pure hypercholesterolemia    QT prolongation 03/07/2019   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 Lynnette Caffey and Dr. Laneta Simmers   Seizures [facility])    Severe aortic stenosis    Sinus tachycardia 03/07/2019   Sleep apnea    CPAP   Type 2 diabetes mellitus with vascular disease (HCC) 09/11/2019    Past Surgical History:  Past Surgical History:  Procedure Laterality Date   ENDOVENOUS ABLATION SAPHENOUS VEIN W/ LASER Right 08/20/2020   endovenous laser ablation right greater saphenous vein by Cari Caraway MD    ENDOVENOUS ABLATION SAPHENOUS VEIN W/ LASER Left 11/16/2022   endovenous laser ablation left greater saphenous vein by Cari Caraway MD   INTRAOPERATIVE TRANSTHORACIC ECHOCARDIOGRAM N/A 02/07/2023   Procedure: INTRAOPERATIVE TRANSTHORACIC ECHOCARDIOGRAM;  Surgeon: Orbie Pyo, MD;  Location: [facility] INVASIVE CV LAB;  Service: Open Heart Surgery;  Laterality: N/A;   JOINT REPLACEMENT     left knee replacement x 2   KNEE ARTHROSCOPY Bilateral    LEFT HEART CATH AND CORONARY ANGIOGRAPHY N/A 01/17/2018   Procedure: LEFT HEART CATH AND CORONARY ANGIOGRAPHY;  Surgeon: Marykay Lex, MD;  Location: [facility] INVASIVE CV LAB;  Service: Cardiovascular;  Laterality: N/A;   PRESSURE SENSOR/CARDIOMEMS N/A 08/26/2021   Procedure: PRESSURE SENSOR/CARDIOMEMS;  Surgeon: Laurey Morale, MD;  Location: [facility] INVASIVE CV LAB;  Service: Cardiovascular;  Laterality: N/A;   RIGHT HEART CATH N/A 08/26/2021   Procedure: RIGHT HEART CATH;  Surgeon: Laurey Morale, MD;  Location: [facility] INVASIVE CV LAB;  Service: Cardiovascular;  Laterality: N/A;   RIGHT HEART CATH AND CORONARY ANGIOGRAPHY N/A 01/26/2023   Procedure: RIGHT HEART CATH AND CORONARY ANGIOGRAPHY;  Surgeon: Orbie Pyo, MD;  Location: [facility] INVASIVE CV LAB;  Service: Cardiovascular;  Laterality: N/A;   TEE WITHOUT CARDIOVERSION N/A 08/26/2021   Procedure: TRANSESOPHAGEAL ECHOCARDIOGRAM (TEE);  Surgeon: Laurey Morale, MD;  Location: [facility]  ENDOSCOPY;  Service: Cardiovascular;  Laterality: N/A;   TOOTH EXTRACTION N/A 02/03/2023   Procedure: DENTAL RESTORATION/EXTRACTIONS;  Surgeon: [name], DMD;  Location: [facility] OR;  Service: Oral Surgery;  Laterality: N/A;   TRANSCATHETER AORTIC VALVE REPLACEMENT, TRANSFEMORAL Right 02/07/2023   Procedure:  Transcatheter Aortic Valve Replacement, Transfemoral;  Surgeon: Orbie Pyo, MD;  Location: [facility] INVASIVE CV LAB;  Service: Open Heart Surgery;  Laterality: Right;   UMBILICAL HERNIA REPAIR      Assessment & Plan Clinical Impression: Patient is a 67 y.o. year old male with history of CAF-on eliquis, CVA w/residual L-HP, T2DM with neuropathy, MDD, GAD, chronic pain s/p spinal cord stimulator, seizure d/o, COPD, OSA, TAVR, chronic diastolic CHF, SOB w/activity,chronic stasis ulcers,  morbid obesity-BMI 48;  who was admitted on 06/25/23 after found unresponsive in his recliner.  He aroused with sternal rub but was noted to have slurred speech with left sided weakness.  Dr. Wilford Corner questioned recurdescence of old stroke symptoms, unwitnessed seizure or hypercarbia with narcosis as cause. MRI brain revealed small left inferior infarct felt to be silent and Dr. Pearlean Brownie recommended continuing Eliquis bid. Acute respiratory failure treated with supplemental oxygen and he has been refusing CPAP.   Lyrica and Trazodone held per Dr. Teresa Coombs and BP meds held to allow for permissive HTN. He was noted to have wide complex tachycardia felt to be atrial in nature and low dose BB resumed with recommendations to titrate to 25 mg BID. He continues to report SOB/CP possibly due to fluid retention related to HFpEF, cardiology started [facility] today.  Plan for outpatient Zio patch.    WOC consulted for input on chronic stastis ulcers LLE with open wounds and RLE with healing wounds. Aquacel added for draining wound LLE and xeroform to scabbed areas on RLE with compression for edema control.    He has had multiple recent admissions and has aide 4 days/week to assist with B/D and home management. .     Lives in a one-story apartment no steps to enter.  He reports his significant other can assist after discharge    Patient currently requires mod with mobility secondary to muscle weakness, decreased cardiorespiratoy endurance  and decreased oxygen support, impaired timing and sequencing, unbalanced muscle activation, and decreased coordination, decreased attention, decreased awareness, decreased problem solving, and decreased memory, and decreased sitting balance, decreased standing balance, hemiplegia, and decreased balance strategies.  Prior to hospitalization, patient was modified independent  with household mobility with rollator and lived with Significant other Lawrence [name]) in a Apartment  Home access is  Level entry.  Patient will benefit from skilled PT intervention to maximize safe functional mobility, minimize fall risk, and decrease caregiver burden for planned discharge home with intermittent assist.  Anticipate patient will benefit from follow up [facility] at discharge.  PT - End of Session Activity Tolerance: Decreased this session PT Assessment Rehab Potential (ACUTE/IP ONLY): Fair PT Barriers to Discharge: Home environment access/layout;Wound Care;Weight;New oxygen PT Barriers to Discharge Comments: dizziness, pain, neuropathy PT Patient demonstrates impairments in the following area(s): Balance;Edema;Endurance;Motor;Pain;Safety;Sensory;Skin Integrity PT Transfers Functional Problem(s): Bed Mobility;Bed to Chair;Car;Furniture PT Locomotion Functional Problem(s): Ambulation;Wheelchair Mobility PT Plan PT Intensity: Minimum of 1-2 x/day ,45 to 90 minutes PT Frequency: 5 out of 7 days PT Duration Estimated Length of Stay: 16-20 days PT Treatment/Interventions: Ambulation/gait training;Discharge planning;Psychosocial support;Functional mobility training;Therapeutic Activities;Visual/perceptual remediation/compensation;Balance/vestibular training;Disease management/prevention;Neuromuscular re-education;Skin care/wound management;Therapeutic Exercise;Wheelchair propulsion/positioning;Cognitive remediation/compensation;DME/adaptive equipment instruction;Pain management;Splinting/orthotics;UE/LE Strength taining/ROM;Community  reintegration;Patient/family education;Stair training;UE/LE Coordination activities PT Transfers Anticipated Outcome(s): mod I  PT Locomotion Anticipated Outcome(s): supervision PT Recommendation Follow Up Recommendations: Home health PT Patient destination: Home Equipment Recommended: To be determined Equipment Details: pt has rollator, electric scooter, and BSC   PT Evaluation Precautions/Restrictions Precautions Precautions: Fall Precaution Comments: L hemi, Restrictions Weight Bearing Restrictions: No Pain Interference Pain Interference Pain Effect on Sleep: 3. Frequently Pain Interference with Therapy Activities: 1. Rarely or not at all Pain Interference with Day-to-Day Activities: 2. Occasionally Home Living/Prior Functioning Home Living Available Help at Discharge: Family;Available 24 hours/day;Personal care attendant Type of Home: Apartment Home Access: Level entry Home Layout: One level Bathroom Shower/Tub: Engineer, manufacturing systems: Standard (pt has a BSC) Bathroom Accessibility: Yes Additional Comments: Has a PCA 4 days a week for about 4 hours a day (13 hour week); wears CPAP at night, no O2 at home  Lives With: Significant other Lawrence [name]) Prior Function Level of Independence: Independent with gait;Needs assistance with ADLs;Needs assistance with homemaking;Independent with transfers (with rollator)  Able to Take Stairs?: No Driving: No Vocation: Retired Vision/Perception  Vision - History Ability to See in Adequate Light: 1 Impaired Perception Perception: Within Functional Limits Praxis Praxis: WFL  Cognition Overall Cognitive Status: Impaired/Different from baseline Arousal/Alertness: Awake/alert Orientation Level: Oriented X4 Year: 2024 Month: November Day of Week: Correct Attention: Sustained Sustained Attention: Impaired Sustained Attention Impairment: Verbal basic Memory: Impaired Memory Impairment: Retrieval deficit Awareness:  Impaired Awareness Impairment: Emergent impairment Problem Solving: Impaired Problem Solving Impairment: Functional complex Executive Function: Initiating;Self Correcting Initiating: Appears intact Self Correcting: Impaired Self Correcting Impairment: Functional complex Safety/Judgment: Appears intact Sensation Sensation Light Touch: Impaired by gross assessment Additional Comments: B LE light touch impaired-2/2 neuropathy-pt unable to detect light touch Coordination Gross Motor Movements are Fluid and Coordinated: No Coordination and Movement Description: limited 2/2 L hemi Finger Nose Finger Test: dysmetria L UE-pt hiting L finger to upper lip Heel Shin Test: limited by L LE weakness Motor  Motor Motor: Hemiplegia Motor - Skilled Clinical Observations: L hemi   Trunk/Postural Assessment  Cervical Assessment Cervical Assessment: Exceptions to [facility] (forward head) Thoracic Assessment Thoracic Assessment: Exceptions to [facility] (kyphosis) Lumbar Assessment Lumbar Assessment: Exceptions to [facility] (posterior pelvic tilt) Postural Control Postural Control: Deficits on evaluation Righting Reactions: delayed Protective Responses: delayed  Balance Balance Balance Assessed: Yes Static Sitting Balance Static Sitting - Level of Assistance: 5: Stand by assistance (supervision) Dynamic Sitting Balance Dynamic Sitting - Level of Assistance: 4: Min Oncologist Standing - Balance Support: Bilateral upper extremity supported Static Standing - Level of Assistance: 5: Stand by assistance (CGA) Dynamic Standing Balance Dynamic Standing - Balance Support: Bilateral upper extremity supported Dynamic Standing - Level of Assistance: 4: Min assist Dynamic Standing - Comments: stand pivot transfer with RW Extremity Assessment  RLE Assessment RLE Assessment: Exceptions to [facility] General Strength Comments: grossly 3+/5 LLE Assessment LLE Assessment: Exceptions to [facility] General  Strength Comments: grossly 3-/5  Care Tool Care Tool Bed Mobility Roll left and right activity   Roll left and right assist level: Contact Guard/Touching assist    Sit to lying activity   Sit to lying assist level: Minimal Assistance - Patient > 75%    Lying to sitting on side of bed activity   Lying to sitting on side of bed assist level: the ability to move from lying on the back to sitting on the side of the bed with no back support.: Minimal Assistance - Patient > 75%     Care Tool Transfers Sit to stand transfer  Sit to stand assist level: Moderate Assistance - Patient 50 - 74%    Chair/bed transfer   Chair/bed transfer assist level: Moderate Assistance - Patient 50 - 74%    Car transfer Car transfer activity did not occur: Safety/medical concerns        Care Tool Locomotion Ambulation Ambulation activity did not occur: Safety/medical concerns        Walk 10 feet activity Walk 10 feet activity did not occur: Safety/medical concerns       Walk 50 feet with 2 turns activity Walk 50 feet with 2 turns activity did not occur: Safety/medical concerns      Walk 150 feet activity Walk 150 feet activity did not occur: Safety/medical concerns      Walk 10 feet on uneven surfaces activity Walk 10 feet on uneven surfaces activity did not occur: Safety/medical concerns      Stairs Stair activity did not occur: Safety/medical concerns        Walk up/down 1 step activity  Safety/Medical      Walk up/down 4 steps activity Walk up/down 4 steps activity did not occur: Safety/medical concerns      Walk up/down 12 steps activity Walk up/down 12 steps activity did not occur: Safety/medical concerns      Pick up small objects from floor   Pick up small object from the floor assist level: Total Assistance - Patient < 25%    Wheelchair Is the patient using a wheelchair?: Yes Type of Wheelchair: Manual   Wheelchair assist level: Dependent - Patient 0%    Wheel 50 feet  with 2 turns activity   Assist Level: Dependent - Patient 0%  Wheel 150 feet activity   Assist Level: Dependent - Patient 0%    Refer to Care Plan for Long Term Goals  SHORT TERM GOAL WEEK 1 PT Short Term Goal 1 (Week 1): pt will perform sit to stand with LRAD and min A PT Short Term Goal 2 (Week 1): Pt will perform bed to chair transfer with LRAD and min A PT Short Term Goal 3 (Week 1): pt will ambulate 20 feet with LRAD and mod A or less  Recommendations for other services: None   Skilled Therapeutic Intervention Mobility Bed Mobility Supine to Sit: Minimal Assistance - Patient > 75% Transfers Transfers: Sit to Stand;Stand Pivot Transfers Sit to Stand: Moderate Assistance - Patient 50-74% Stand Pivot Transfers: Moderate Assistance - Patient 50 - 74% Stand Pivot Transfer Details: Verbal cues for precautions/safety;Verbal cues for safe use of DME/AE Transfer (Assistive device): Rolling walker Locomotion  Gait Ambulation: No Gait Gait: No Stairs / Additional Locomotion Stairs: No Wheelchair Mobility Wheelchair Mobility: No   Discharge Criteria: Patient will be discharged from PT if patient refuses treatment 3 consecutive times without medical reason, if treatment goals not met, if there is a change in medical status, if patient makes no progress towards goals or if patient is discharged from hospital.  The above assessment, treatment plan, treatment alternatives and goals were discussed and mutually agreed upon: by patient  Pt seated in WC upon arrival. Pt agreeable to therapy. Pt reports 4/10 R neuropathic pain in R foot, premedicated.   Evaluation completed (see details above and below) with education on PT POC and goals and individual treatment initiated with focus on transfer training.   Vitals: eval limited 2/2 dizziness, vitals assessed 2L O2 90-93%, HR 46, BP 163/82. Pt reports dizziness upon standing, notified nurse and PA, nurse and PA present to perform  EKG.    Bed mobility: min A with use of bedrails and min A for L LE  Transfers: sit to stand with mod A with RW, stand pivot transfer with RW with mod A for power up and CGA for pivot, no evidence of buckling.   Pt supine in bed at end of session with bed alarm on and needs within reach with nurse and PA in room at end of session.     [facility] [place], [place], DPT  06/29/2023, 3:38 PM

## 2023-06-29 NOTE — Consult Note (Signed)
[facility] Liaison Consult Note    06/29/2023  Lawrence [name] 1956-03-06 [id number]  Follow up:  Patient admitted to [facility], CIR program  Primary Care Provider: [name], PA-C  INSURANCE: [facility] Medicare,   Will follow up with [facility] transition team for transition update to CIR.  [name], RN, BSN, CCM [employer], [facility] Liaison Direct Dial: [phone] or secure chat Email: [email or web address]

## 2023-06-29 NOTE — Plan of Care (Signed)
  Problem: RH Balance Goal: LTG: Patient will maintain dynamic sitting balance (OT) Description: LTG:  Patient will maintain dynamic sitting balance with assistance during activities of daily living (OT) Flowsheets (Taken 06/29/2023 2150) LTG: Pt will maintain dynamic sitting balance during ADLs with: Independent with assistive device Goal: LTG Patient will maintain dynamic standing with ADLs (OT) Description: LTG:  Patient will maintain dynamic standing balance with assist during activities of daily living (OT)  Flowsheets (Taken 06/29/2023 2150) LTG: Pt will maintain dynamic standing balance during ADLs with: Contact Guard/Touching assist   Problem: Sit to Stand Goal: LTG:  Patient will perform sit to stand in prep for activites of daily living with assistance level (OT) Description: LTG:  Patient will perform sit to stand in prep for activites of daily living with assistance level (OT) Flowsheets (Taken 06/29/2023 2150) LTG: PT will perform sit to stand in prep for activites of daily living with assistance level: Contact Guard/Touching assist   Problem: RH Grooming Goal: LTG Patient will perform grooming w/assist,cues/equip (OT) Description: LTG: Patient will perform grooming with assist, with/without cues using equipment (OT) Flowsheets (Taken 06/29/2023 2150) LTG: Pt will perform grooming with assistance level of: Independent with assistive device    Problem: RH Bathing Goal: LTG Patient will bathe all body parts with assist levels (OT) Description: LTG: Patient will bathe all body parts with assist levels (OT) Flowsheets (Taken 06/29/2023 2150) LTG: Pt will perform bathing with assistance level/cueing: Minimal Assistance - Patient > 75%   Problem: RH Dressing Goal: LTG Patient will perform upper body dressing (OT) Description: LTG Patient will perform upper body dressing with assist, with/without cues (OT). Flowsheets (Taken 06/29/2023 2150) LTG: Pt will perform upper body  dressing with assistance level of: Independent with assistive device Goal: LTG Patient will perform lower body dressing w/assist (OT) Description: LTG: Patient will perform lower body dressing with assist, with/without cues in positioning using equipment (OT) Flowsheets (Taken 06/29/2023 2150) LTG: Pt will perform lower body dressing with assistance level of: Minimal Assistance - Patient > 75%   Problem: RH Toileting Goal: LTG Patient will perform toileting task (3/3 steps) with assistance level (OT) Description: LTG: Patient will perform toileting task (3/3 steps) with assistance level (OT)  Flowsheets (Taken 06/29/2023 2150) LTG: Pt will perform toileting task (3/3 steps) with assistance level: Contact Guard/Touching assist   Problem: RH Functional Use of Upper Extremity Goal: LTG Patient will use RT/LT upper extremity as a (OT) Description: LTG: Patient will use right/left upper extremity as a stabilizer/gross assist/diminished/nondominant/dominant level with assist, with/without cues during functional activity (OT) Flowsheets (Taken 06/29/2023 2150) LTG: Use of upper extremity in functional activities: LUE as nondominant level   Problem: RH Toilet Transfers Goal: LTG Patient will perform toilet transfers w/assist (OT) Description: LTG: Patient will perform toilet transfers with assist, with/without cues using equipment (OT) Flowsheets (Taken 06/29/2023 2150) LTG: Pt will perform toilet transfers with assistance level of: Contact Guard/Touching assist   Problem: RH Tub/Shower Transfers Goal: LTG Patient will perform tub/shower transfers w/assist (OT) Description: LTG: Patient will perform tub/shower transfers with assist, with/without cues using equipment (OT) Flowsheets (Taken 06/29/2023 2150) LTG: Pt will perform tub/shower stall transfers with assistance level of: Contact Guard/Touching assist

## 2023-06-29 NOTE — Progress Notes (Signed)
PROGRESS NOTE   Subjective/Complaints: Reports difficulty sleeping last night. No additional concerns.  Denies chest pain today.  ROS: Patient denies fever, rash, sore throat, blurred vision, dizziness, nausea, vomiting, diarrhea, cough, shortness of breath or chest pain, headache, or mood change.    Objective:   No results found. Recent Labs    06/28/23 0551 06/29/23 0515  WBC 10.4 11.7*  HGB 12.8* 14.1  HCT 40.1 42.8  PLT 291 312   Recent Labs    06/28/23 0551 06/29/23 0515  NA 139 137  K 3.6 3.1*  CL 101 97*  CO2 29 31  GLUCOSE 91 115*  BUN 23 25*  CREATININE 1.04 1.27*  CALCIUM 8.7* 8.8*    Intake/Output Summary (Last 24 hours) at 06/29/2023 1053 Last data filed at 06/29/2023 1000 Gross per 24 hour  Intake 240 ml  Output 4300 ml  Net -4060 ml        Physical Exam: Vital Signs Blood pressure 128/65, pulse 65, temperature 98.1 F (36.7 C), temperature source Oral, resp. rate 17, height 6\' 2"  (1.88 m), weight (!) 153.9 kg, SpO2 94%.     General: , No apparent distress, morbidly obese HEENT: Head is normocephalic, atraumatic,MMM neck: Supple  Heart: Reg rate and rhythm.  Chest: CTA bilaterally without wheezes, rales, or rhonchi; nonlabored breathing Abdomen: Soft, mildly-tender, non-distended, bowel sounds positive. Extremities: No clubbing, cyanosis, 1+ bilateral lower extremity edema Psych: Pt's affect is appropriate. Pt is cooperative Skin: Dry dressing in place to  b/l ankles   Neuro:     Mental Status: AAOx3, memory intact, fund of knowledge appropriate Speech/Languate: Naming and repetition intact, fluent, follows simple commands CRANIAL NERVES: Altered sensation left face to light touch       MOTOR: RUE: 5/5 Deltoid, 5/5 Biceps, 5/5 Triceps,5/5 Grip LUE: 4/5 Deltoid, 4/5 Biceps, 4/5 Triceps, 4/5 Grip RLE: HF 5/5, KE 5/5, ADF 5/5, APF 5/5 LLE: HF 1-2/5, KE 1/5, ADF 2/5, APF 2/5      No hypertonia noted    SENSORY: Altered sensation to light touch in left upper extremity and left lower extremity.   Coordination: Finger to nose mildly slowed in the left compared to the right      Assessment/Plan: 1. Functional deficits which require 3+ hours per day of interdisciplinary therapy in a comprehensive inpatient rehab setting. Physiatrist is providing close team supervision and 24 hour management of active medical problems listed below. Physiatrist and rehab team continue to assess barriers to discharge/monitor patient progress toward functional and medical goals  Care Tool:  Bathing              Bathing assist       Upper Body Dressing/Undressing Upper body dressing        Upper body assist      Lower Body Dressing/Undressing Lower body dressing            Lower body assist       Toileting Toileting    Toileting assist       Transfers Chair/bed transfer  Transfers assist           Locomotion Ambulation   Ambulation assist  Walk 10 feet activity   Assist           Walk 50 feet activity   Assist           Walk 150 feet activity   Assist           Walk 10 feet on uneven surface  activity   Assist           Wheelchair     Assist               Wheelchair 50 feet with 2 turns activity    Assist            Wheelchair 150 feet activity     Assist          Blood pressure 128/65, pulse 65, temperature 98.1 F (36.7 C), temperature source Oral, resp. rate 17, height 6\' 2"  (1.88 m), weight (!) 153.9 kg, SpO2 94%.  Medical Problem List and Plan: 1. Functional deficits secondary to acute ischemic left cerebellar infarct with hx of prior CVA             -patient may shower, please cover RLE skin breakdown             -ELOS/Goals: 16 to 20 days, PT OT supervision to min assist, SLP supervision MRI             -Continue CIR, evaluations by therapy  today 2.  Antithrombotics: -DVT/anticoagulation:  Pharmaceutical: Eliquis             -antiplatelet therapy: N/A 3. Chronic pain/Pain Management: Has spinal cord stimulator. Continue Tylenol prn.   --will d/c hydrocodone and monitor as denies pain at this time.  --continue robaxin prn  4. Mood/Behavior/Sleep: LCSW to follow for evaluation and support.              -antipsychotic agents: Abilify-->will change to bedtime as at home.              --chronic insomnia was being managed with Trazodone and Remeron--> d/c per neurology --has PTSD/Nightmares but sleeping well in hospital. Resume Prasozin 2 mg/HS if BP stable.  5. Neuropsych/cognition: This patient is capable of making decisions on his own behalf. 6. Skin/Wound Care: Routine pressure relief measures.              -Bilateral lower extremity venous stasis ulcers.  Continue current wound care with Aquacel and Xeroform 7. Fluids/Electrolytes/Nutrition: Monitor I/O. Check CMET in am. Recheck Mg and Phos level in am8.  8. HTN: BP stable. Resume Prazosin when able  --Wide complex tachycardia: No significant arrhthymias seen per Dr. Tenny Craw. Plans on             --metoprolol 12.5 mg bid and can be increased to 25 mg bid.  9. T2DM with peripheral neuropathy: Hgb A1C-6.5 and well controlled. Monitor BS ac/hs and use SSI for elevated BS.              --continue insulin glargine 40 units daily with novolog 12 units TID  -11/21 well-controlled continue current regimen  CBG (last 3)  Recent Labs    06/28/23 2053 06/29/23 0546 06/29/23 1150  GLUCAP 164* 125* 133*    10. Chronic diastolic CHF: Added 1500 cc FR per recommendations. HH diet  --Daily weight and monitor for signs of overload--weight at admission 11/16- 373 lbs.  --evaluated by cards today-->BB increased to 25 mg BID, Demadex bid. Zaroxolyn X 1 dose --cards will follow for response in am.  11/21 not sure that weight today was accurate Filed Weights   06/28/23 1935 06/28/23 2118  06/29/23 0500  Weight: (!) 160.3 kg (!) 160.3 kg (!) 153.9 kg    11. Wide complex tachycardia/hx PAF: Started metoprolol 25mg  BID by cardiology. On Eliquis 12. Seizure d/o: Has been seizure free on Keppra and Vimpat -->followed by Dr. Teresa Coombs.  13. Major depressive: PTA--was on Effexor, Abilify, Remeron and Trazodone.              --lethargy could be due to daytime Abilify-->will change to nights             --Remeron and trazodone on hold per neuro  -Continue melatonin 14. GAD w/panic attacks/PTSD: Chronic issues onoging and followed by [facility].  --Was on Prazosin and Buspar.             --Mood stable on current dose Buspar. Monitor for now  15. Hypokalemia: Improved with supplementation.  -11/21 potassium lower today at 3.1, will change potassium supplement to 20 mEq twice daily.  Recheck tomorrow 16. Leucocytosis: WBC trending down-->monitor 17. OSA: Now compliant with CPAP but has been requiring 5 L oxygen per [place].              --question OHS as contributing factor leading to hypersomnia. Wean oxygen as able.   -Appears to be tolerating weaning down nasal O2, he reports he used his CPAP at night 18. COPD: On Trilegy at home -->on Breo/Incruse  Continue to wean off oxygen.   19. Neuropathy: Managed with spinal cord stimulator and Lyrica (Dr. Beverly Gust Shaw/[facility]).  20. Gout flare R great toe: On colchicine bid for 5 more days, resume allopurinol in 2 to 3 weeks 21. CKD 3A:BUN/Scr-38/1.31 at admission.   -Creatinine a little higher today at 1.27/BUN 25.  Continue to monitor 22. Morbid Obesity. Body mass index is 47.89 kg/m.             -Dietary counseling    LOS: 1 days A FACE TO FACE EVALUATION WAS PERFORMED  Fanny Dance 06/29/2023, 10:53 AM

## 2023-06-29 NOTE — Plan of Care (Signed)
  Problem: Consults Goal: RH STROKE PATIENT EDUCATION Description: See Patient Education module for education specifics  Outcome: Progressing   Problem: RH BOWEL ELIMINATION Goal: RH STG MANAGE BOWEL WITH ASSISTANCE Description: STG Manage Bowel with min Assistance. Outcome: Progressing Goal: RH STG MANAGE BOWEL W/MEDICATION W/ASSISTANCE Description: STG Manage Bowel with Medication with min Assistance. Outcome: Progressing   Problem: RH BLADDER ELIMINATION Goal: RH STG MANAGE BLADDER WITH ASSISTANCE Description: STG Manage Bladder With min Assistance Outcome: Progressing   Problem: RH SKIN INTEGRITY Goal: RH STG SKIN FREE OF INFECTION/BREAKDOWN Description: Skin will remain intact and free of infection/breakdown with min assist  Outcome: Progressing Goal: RH STG MAINTAIN SKIN INTEGRITY WITH ASSISTANCE Description: STG Maintain Skin Integrity With min Assistance. Outcome: Progressing   Problem: RH SAFETY Goal: RH STG ADHERE TO SAFETY PRECAUTIONS W/ASSISTANCE/DEVICE Description: STG Adhere to Safety Precautions With min Assistance/Device. Outcome: Progressing Goal: RH STG DECREASED RISK OF FALL WITH ASSISTANCE Description: STG Decreased Risk of Fall With min Assistance. Outcome: Progressing   Problem: RH PAIN MANAGEMENT Goal: RH STG PAIN MANAGED AT OR BELOW PT'S PAIN GOAL Description: Less than 4 with PRN medications min assist  Outcome: Progressing   Problem: RH KNOWLEDGE DEFICIT Goal: RH STG INCREASE KNOWLEDGE OF HYPERTENSION Description: Patient/caregiver will be able to manage cardiac medications and self care from nursing education, nursing handouts, and other resources independently  Outcome: Progressing Goal: RH STG INCREASE KNOWLEGDE OF HYPERLIPIDEMIA Description: Patient/caregiver will be able to manage cholesterol medications and diet/lifestyle modifications to improve HDL of 30 and trig of 209 from nursing education and nursing handouts independently  Outcome:  Progressing Goal: RH STG INCREASE KNOWLEDGE OF STROKE PROPHYLAXIS Description: Patient/caregiver will be able to manage Eliquis, self care, and other medications to reduce the risk of stroke from nursing educations, nursing handouts, and other resources independently  Outcome: Progressing

## 2023-06-29 NOTE — Progress Notes (Signed)
Inpatient Rehabilitation Care Coordinator Assessment and Plan Patient Details  Name: Lawrence [name] MRN: [id number] Date of Birth: 1956/02/23  Today's Date: 06/29/2023  Hospital Problems: Principal Problem:   Acute stroke due to occlusion of left cerebellar artery [facility]) Active Problems:   Primary hypertension   Type 2 diabetes mellitus with diabetic polyneuropathy, with long-term current use of insulin (HCC)  Past Medical History:  Past Medical History:  Diagnosis Date   Acquired dilation of ascending aorta and aortic root (HCC)    40mm by echo 01/2021   Adenomatous colon polyp 2007   Anemia    Anxiety    Asthma    BPH without obstruction/lower urinary tract symptoms 02/22/2017   Chronic diastolic (congestive) heart failure (HCC)    Chronic venous stasis 03/07/2019   COPD (chronic obstructive pulmonary disease) (HCC)    Coronary artery calcification seen on CAT scan    Depression    Diabetic neuropathy (HCC) 09/11/2019   History of colon polyps 08/24/2018   Hypertension    Morbid obesity (HCC)    OSA (obstructive sleep apnea)    Pain due to onychomycosis of toenails of both feet 09/11/2019   Peripheral neuropathy 02/22/2017   Primary osteoarthritis, left shoulder 03/05/2017   PTSD (post-traumatic stress disorder)    Pure hypercholesterolemia    QT prolongation 03/07/2019   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 Lynnette Caffey and Dr. Laneta Simmers   Seizures [facility])    Severe aortic stenosis    Sinus tachycardia 03/07/2019   Sleep apnea    CPAP   Type 2 diabetes mellitus with vascular disease (HCC) 09/11/2019   Past Surgical History:  Past Surgical History:  Procedure Laterality Date   ENDOVENOUS ABLATION SAPHENOUS VEIN W/ LASER Right 08/20/2020   endovenous laser ablation right greater saphenous vein by Cari Caraway MD    ENDOVENOUS ABLATION SAPHENOUS VEIN W/ LASER Left 11/16/2022   endovenous laser ablation  left greater saphenous vein by Cari Caraway MD   INTRAOPERATIVE TRANSTHORACIC ECHOCARDIOGRAM N/A 02/07/2023   Procedure: INTRAOPERATIVE TRANSTHORACIC ECHOCARDIOGRAM;  Surgeon: Orbie Pyo, MD;  Location: [facility] INVASIVE CV LAB;  Service: Open Heart Surgery;  Laterality: N/A;   JOINT REPLACEMENT     left knee replacement x 2   KNEE ARTHROSCOPY Bilateral    LEFT HEART CATH AND CORONARY ANGIOGRAPHY N/A 01/17/2018   Procedure: LEFT HEART CATH AND CORONARY ANGIOGRAPHY;  Surgeon: Marykay Lex, MD;  Location: [facility] INVASIVE CV LAB;  Service: Cardiovascular;  Laterality: N/A;   PRESSURE SENSOR/CARDIOMEMS N/A 08/26/2021   Procedure: PRESSURE SENSOR/CARDIOMEMS;  Surgeon: Laurey Morale, MD;  Location: [facility] INVASIVE CV LAB;  Service: Cardiovascular;  Laterality: N/A;   RIGHT HEART CATH N/A 08/26/2021   Procedure: RIGHT HEART CATH;  Surgeon: Laurey Morale, MD;  Location: [facility] INVASIVE CV LAB;  Service: Cardiovascular;  Laterality: N/A;   RIGHT HEART CATH AND CORONARY ANGIOGRAPHY N/A 01/26/2023   Procedure: RIGHT HEART CATH AND CORONARY ANGIOGRAPHY;  Surgeon: Orbie Pyo, MD;  Location: [facility] INVASIVE CV LAB;  Service: Cardiovascular;  Laterality: N/A;   TEE WITHOUT CARDIOVERSION N/A 08/26/2021   Procedure: TRANSESOPHAGEAL ECHOCARDIOGRAM (TEE);  Surgeon: Laurey Morale, MD;  Location: [facility] ENDOSCOPY;  Service: Cardiovascular;  Laterality: N/A;   TOOTH EXTRACTION N/A 02/03/2023   Procedure: DENTAL RESTORATION/EXTRACTIONS;  Surgeon: [name], DMD;  Location: [facility] OR;  Service: Oral Surgery;  Laterality: N/A;   TRANSCATHETER AORTIC VALVE REPLACEMENT, TRANSFEMORAL Right 02/07/2023  Procedure: Transcatheter Aortic Valve Replacement, Transfemoral;  Surgeon: Orbie Pyo, MD;  Location: [facility] INVASIVE CV LAB;  Service: Open Heart Surgery;  Laterality: Right;   UMBILICAL HERNIA REPAIR     Social History:  reports that he quit smoking about 7 years ago. His smoking use included cigarettes. He started smoking  about 51 years ago. He has a 44 pack-year smoking history. He has been exposed to tobacco smoke. He has never used smokeless tobacco. He reports current alcohol use. He reports that he does not use drugs.  Family / Support Systems Marital Status: Single Patient Roles: Partner Spouse/Significant Other: [name] [phone] Children: Daughter in [place] where pt is from Other Supports: Taylor-sister [phone] Anticipated Caregiver: [name] and PCA from [place] Ability/Limitations of Caregiver: [name] has health issues but can do supervision with care. PCA assists with B & D and home managment Caregiver Availability: 24/7 Family Dynamics: Close with [name] and sister and daughter. He has been managing and wants to get back to this. he feels this stroke was light and is thankful for this.  Social History Preferred language: English Religion: Protestant Cultural Background: No issues Education: Charity fundraiser - How often do you need to have someone help you when you read instructions, pamphlets, or other written material from your doctor or pharmacy?: Never Writes: Yes Employment Status: Retired Marine scientist Issues: No issues Guardian/Conservator: None-according to MD pt is capable of making his own decisions while here. [name] comes daily to provide supprt   Abuse/Neglect Abuse/Neglect Assessment Can Be Completed: Yes Physical Abuse: Denies Verbal Abuse: Denies Sexual Abuse: Denies Exploitation of patient/patient's resources: Denies Self-Neglect: Denies  Patient response to: Social Isolation - How often do you feel lonely or isolated from those around you?: Rarely  Emotional Status Pt's affect, behavior and adjustment status: Pt is motivated to get back to the level he was prior to this admission. he feels his stroke was light and it only affected his leg this time. He does want to get mobile like he was prior to admission. He does use the lift chair, rolloatro and scooter to  assist him Recent Psychosocial Issues: other health issues-prior CVA's and multiple other health issues Psychiatric History: Hx-depression and takes medications for this he finds helpful. He would benefit from seeing neuro-psych while here. Have placed on list to be seen Substance Abuse History: No issues  Patient / Family Perceptions, Expectations & Goals Pt/Family understanding of illness & functional limitations: Pt can explain his stroke and deficits he is hopeful he will do well here and get back to his prior level. He does talk with the MD's involved and feels he has a good understanding of his plan moving forward. Premorbid pt/family roles/activities: partner, father, sibling, friend, veteran, etc Anticipated changes in roles/activities/participation: resume Pt/family expectations/goals: Pt states: " I hope to get back to where I was before this, but glad to be here."  [employer]: Other (Comment) (PCA 4 hours x 4 days a week via Medicaid) Premorbid Home Care/DME Agencies: Other (Comment) (scooter, lift chair, rollator, bsc, hospital bed) Transportation available at discharge: [facility]ses [facility] or his [employer] transport, cant get in and out of a car Is the patient able to respond to transportation needs?: Yes In the past 12 months, has lack of transportation kept you from medical appointments or from getting medications?: No In the past 12 months, has lack of transportation kept you from meetings, work, or from getting things needed for daily living?: No Resource referrals  recommended: Neuropsychology  Discharge Planning Living Arrangements: Spouse/significant other Support Systems: Spouse/significant other, Children, Other relatives, Friends/neighbors, Home care staff Type of Residence: Private residence Insurance Resources: Media planner (specify), Medicaid (specify county) ([employer] medicare and [employer]) Surveyor, quantity Resources: Tree surgeon, Family  Support Financial Screen Referred: No Living Expenses: Psychologist, sport and exercise Management: Patient, Significant Other Does the patient have any problems obtaining your medications?: No Home Management: Aide and [name] Patient/Family Preliminary Plans: Return home with [name] and his PCA assist. He was mobile prior to admission unsure how far he could walk. He is aware being evaluated today and goals being set for stay here. Care Coordinator Barriers to Discharge: Decreased caregiver support, Lack of/limited family support, Weight Care Coordinator Anticipated Follow Up Needs: HH/OP  Clinical Impression Pleasant gentleman who is motivated to be here and to work on his leg strength and mobility after having a stroke. His partner-Janet is not able to physically assist but can be there with him. Will await therapy evaluations and work on discharge needs. Pt has equipment used prior to admission and has had home health in the past. Have placed on neuro-psych list due to history of depression.   Lucy Chris 06/29/2023, 10:43 AM

## 2023-06-29 NOTE — Discharge Instructions (Addendum)
Inpatient Rehab Discharge Instructions  Lawrence [name] Discharge date and time:  07/12/23  Activities/Precautions/ Functional Status: Activity: no lifting, driving, or strenuous exercise till cleared by MD Diet: cardiac diet and diabetic diet Limit fluids to 1500 cc/day Wound Care: Wash legs with soap and water, pat dry. Apply eucerin, cover with abd pad for padding and ace wrap from mid foot to below the knee.    Functional status:  ___ No restrictions     ___ Walk up steps independently ___ 24/7 supervision/assistance   ___ Walk up steps with assistance _X__ Intermittent supervision/assistance  ___ Bathe/dress independently ___ Walk with walker     _X__ Bathe/dress with assistance ___ Walk Independently    ___ Shower independently ___ Walk with assistance    ___ Shower with assistance _X__ No alcohol     ___ Return to work/school ________    Special Instructions:    COMMUNITY REFERRALS UPON DISCHARGE:    Home Health:   PT  OT   RN                 Agency:[facility] Phone:[phone]   Medical Equipment/Items Ordered:HAS ALL NEEDED EQUIPMENT FROM PAST ADMISSIONS-TUB BENCH WAS RECOMMENDED BUT PT AND SO REFUSED GETTING [facility]                                                 Agency/Supplier:NA  PCA-[facility]-AIDE TO RESUME 4 HOURS-4 DAYS PER WEEK   My questions have been answered and I understand these instructions. I will adhere to these goals and the provided educational materials after my discharge from the hospital.  Patient/Caregiver Signature _______________________________ Date __________  Clinician Signature _______________________________________ Date __________  Please bring this form and your medication list with you to all your follow-up doctor's appointments.   Information on my medicine - ELIQUIS (apixaban)  This medication education was reviewed with me or my healthcare representative as part of my discharge preparation.   Why was  Eliquis prescribed for you? Eliquis was prescribed for you to reduce the risk of a blood clot forming that can cause a stroke if you have a medical condition called atrial fibrillation (a type of irregular heartbeat).  What do You need to know about Eliquis ? Take your Eliquis TWICE DAILY - one tablet in the morning and one tablet in the evening with or without food. If you have difficulty swallowing the tablet whole please discuss with your pharmacist how to take the medication safely.  Take Eliquis exactly as prescribed by your doctor and DO NOT stop taking Eliquis without talking to the doctor who prescribed the medication.  Stopping may increase your risk of developing a stroke.  Refill your prescription before you run out.  After discharge, you should have regular check-up appointments with your healthcare provider that is prescribing your Eliquis.  In the future your dose may need to be changed if your kidney function or weight changes by a significant amount or as you get older.  What do you do if you miss a dose? If you miss a dose, take it as soon as you remember on the same day and resume taking twice daily.  Do not take more than one dose of ELIQUIS at the same time to make up a missed dose.  Important Safety Information A possible side effect of  Eliquis is bleeding. You should call your healthcare provider right away if you experience any of the following: Bleeding from an injury or your nose that does not stop. Unusual colored urine (red or dark brown) or unusual colored stools (red or black). Unusual bruising for unknown reasons. A serious fall or if you hit your head (even if there is no bleeding).  Some medicines may interact with Eliquis and might increase your risk of bleeding or clotting while on Eliquis. To help avoid this, consult your healthcare provider or pharmacist prior to using any new prescription or non-prescription medications, including herbals, vitamins,  non-steroidal anti-inflammatory drugs (NSAIDs) and supplements.  This website has more information on Eliquis (apixaban): http://www.eliquis.com/eliquis/home

## 2023-06-29 NOTE — Evaluation (Signed)
Occupational Therapy Assessment and Plan  Patient Details  Name: Lawrence [name] MRN: [id number] Date of Birth: 11-11-1955  OT Diagnosis: abnormal posture, ataxia, cognitive deficits, disturbance of vision, hemiplegia affecting non-dominant side, muscle weakness (generalized), other lymphedema, and swelling of limb Rehab Potential: Rehab Potential (ACUTE ONLY): Good ELOS: 16- 20 days   Today's Date: 06/29/2023 OT Individual Time:  -        Hospital Problem: Principal Problem:   Acute stroke due to occlusion of left cerebellar artery (HCC) Active Problems:   Primary hypertension   Type 2 diabetes mellitus with diabetic polyneuropathy, with long-term current use of insulin (HCC)   Past Medical History:  Past Medical History:  Diagnosis Date   Acquired dilation of ascending aorta and aortic root (HCC)    40mm by echo 01/2021   Adenomatous colon polyp 2007   Anemia    Anxiety    Asthma    BPH without obstruction/lower urinary tract symptoms 02/22/2017   Chronic diastolic (congestive) heart failure (HCC)    Chronic venous stasis 03/07/2019   COPD (chronic obstructive pulmonary disease) (HCC)    Coronary artery calcification seen on CAT scan    Depression    Diabetic neuropathy (HCC) 09/11/2019   History of colon polyps 08/24/2018   Hypertension    Morbid obesity (HCC)    OSA (obstructive sleep apnea)    Pain due to onychomycosis of toenails of both feet 09/11/2019   Peripheral neuropathy 02/22/2017   Primary osteoarthritis, left shoulder 03/05/2017   PTSD (post-traumatic stress disorder)    Pure hypercholesterolemia    QT prolongation 03/07/2019   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 Lynnette Caffey and Dr. Laneta Simmers   Seizures [facility])    Severe aortic stenosis    Sinus tachycardia 03/07/2019   Sleep apnea    CPAP   Type 2 diabetes mellitus with vascular disease (HCC) 09/11/2019   Past Surgical History:  Past  Surgical History:  Procedure Laterality Date   ENDOVENOUS ABLATION SAPHENOUS VEIN W/ LASER Right 08/20/2020   endovenous laser ablation right greater saphenous vein by Cari Caraway MD    ENDOVENOUS ABLATION SAPHENOUS VEIN W/ LASER Left 11/16/2022   endovenous laser ablation left greater saphenous vein by Cari Caraway MD   INTRAOPERATIVE TRANSTHORACIC ECHOCARDIOGRAM N/A 02/07/2023   Procedure: INTRAOPERATIVE TRANSTHORACIC ECHOCARDIOGRAM;  Surgeon: Orbie Pyo, MD;  Location: [facility] INVASIVE CV LAB;  Service: Open Heart Surgery;  Laterality: N/A;   JOINT REPLACEMENT     left knee replacement x 2   KNEE ARTHROSCOPY Bilateral    LEFT HEART CATH AND CORONARY ANGIOGRAPHY N/A 01/17/2018   Procedure: LEFT HEART CATH AND CORONARY ANGIOGRAPHY;  Surgeon: Marykay Lex, MD;  Location: [facility] INVASIVE CV LAB;  Service: Cardiovascular;  Laterality: N/A;   PRESSURE SENSOR/CARDIOMEMS N/A 08/26/2021   Procedure: PRESSURE SENSOR/CARDIOMEMS;  Surgeon: Laurey Morale, MD;  Location: [facility] INVASIVE CV LAB;  Service: Cardiovascular;  Laterality: N/A;   RIGHT HEART CATH N/A 08/26/2021   Procedure: RIGHT HEART CATH;  Surgeon: Laurey Morale, MD;  Location: [facility] INVASIVE CV LAB;  Service: Cardiovascular;  Laterality: N/A;   RIGHT HEART CATH AND CORONARY ANGIOGRAPHY N/A 01/26/2023   Procedure: RIGHT HEART CATH AND CORONARY ANGIOGRAPHY;  Surgeon: Orbie Pyo, MD;  Location: [facility] INVASIVE CV LAB;  Service: Cardiovascular;  Laterality: N/A;   TEE WITHOUT CARDIOVERSION N/A 08/26/2021   Procedure: TRANSESOPHAGEAL ECHOCARDIOGRAM (TEE);  Surgeon: Laurey Morale, MD;  Location: [facility] ENDOSCOPY;  Service: Cardiovascular;  Laterality: N/A;   TOOTH EXTRACTION N/A 02/03/2023   Procedure: DENTAL RESTORATION/EXTRACTIONS;  Surgeon: [name], DMD;  Location: [facility] OR;  Service: Oral Surgery;  Laterality: N/A;   TRANSCATHETER AORTIC VALVE REPLACEMENT, TRANSFEMORAL Right 02/07/2023   Procedure: Transcatheter Aortic Valve Replacement,  Transfemoral;  Surgeon: Orbie Pyo, MD;  Location: [facility] INVASIVE CV LAB;  Service: Open Heart Surgery;  Laterality: Right;   UMBILICAL HERNIA REPAIR      Assessment & Plan Clinical Impression: Patient is a 67 y.o. year old male with history of CAF-on eliquis, CVA w/residual L-HP, T2DM with neuropathy, MDD, GAD, chronic pain s/p spinal cord stimulator, seizure d/o, COPD, OSA, TAVR, chronic diastolic CHF, SOB w/activity,chronic stasis ulcers,  morbid obesity-BMI 48;  who was admitted on 06/25/23 after found unresponsive in his recliner.  He aroused with sternal rub but was noted to have slurred speech with left sided weakness.  Dr. Wilford Corner questioned recurdescence of old stroke symptoms, unwitnessed seizure or hypercarbia with narcosis as cause. MRI brain revealed small left inferior infarct felt to be silent and Dr. Pearlean Brownie recommended continuing Eliquis bid. Acute respiratory failure treated with supplemental oxygen and he has been refusing CPAP.   Lyrica and Trazodone held per Dr. Teresa Coombs and BP meds held to allow for permissive HTN. He was noted to have wide complex tachycardia felt to be atrial in nature and low dose BB resumed with recommendations to titrate to 25 mg BID. He continues to report SOB/CP possibly due to fluid retention related to HFpEF, cardiology started [facility] today.  Plan for outpatient Zio patch.    WOC consulted for input on chronic stastis ulcers LLE with open wounds and RLE with healing wounds. Aquacel added for draining wound LLE and xeroform to scabbed areas on RLE with compression for edema control.    He has had multiple recent admissions and has aide 4 days/week to assist with B/D and home management. .     Lives in a one-story apartment no steps to enter.  He reports his significant other can assist after discharge      Patient currently requires max with basic self-care skills and IADL secondary to muscle weakness and muscle joint tightness, decreased  cardiorespiratoy endurance and decreased oxygen support, impaired timing and sequencing, unbalanced muscle activation, ataxia, decreased coordination, and decreased motor planning, decreased visual perceptual skills and decreased visual motor skills, decreased attention, decreased awareness, decreased problem solving, decreased safety awareness, decreased memory, and delayed processing, and decreased sitting balance, decreased standing balance, decreased postural control, hemiplegia, and decreased balance strategies.  Prior to hospitalization, patient could complete BADL's with S UB, min A LB and mod I simple mobility.   Patient will benefit from skilled intervention to decrease level of assist with basic self-care skills, increase independence with basic self-care skills, and increase level of independence with iADL prior to discharge home with care partner.  Anticipate patient will require minimal physical assistance and follow up home health.  OT Evaluation Precautions/Restrictions  Precautions Precautions: Fall Precaution Comments: L hemi, Restrictions Weight Bearing Restrictions: No  Vital Signs Therapy Vitals Temp: 98.1 F (36.7 C) Temp Source: Oral Pulse Rate: (!) 42 Resp: 16 BP: 131/73 Patient Position (if appropriate): Lying Oxygen Therapy SpO2: 93 % O2 Device: Nasal Cannula Pain Pain Assessment Pain Scale: 0-10 Pain Score: 4  Pain Type: Neuropathic pain Pain Location: Foot Pain Orientation: Right Pain Descriptors / Indicators: Burning Pain Onset: On-going Patients Stated Pain Goal: 2 Pain Intervention(s):  Pain med given for lower pain score than stated, per patient request;Rest;Distraction;Relaxation;Repositioned;Elevated extremity Multiple Pain Sites: No Home Living/Prior Functioning Home Living Living Arrangements: Spouse/significant other Available Help at Discharge: Family, Available 24 hours/day, Personal care attendant Type of Home: Apartment Home Access:  Level entry Home Layout: One level Bathroom Shower/Tub: Armed forces operational officer Accessibility: Yes Additional Comments: Has a PCA 4 days a week for about 4 hours a day (13 hour week); wears CPAP at night, no O2 at home  Lives With: Significant other IADL History Mode of Transportation: Car Occupation: Retired Prior Function Level of Independence: Independent with gait, Needs assistance with ADLs, Needs assistance with homemaking, Independent with transfers  Able to Take Stairs?: No Driving: No 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 Baseline Vision/History: 1 Wears glasses Ability to See in Adequate Light: 1 Impaired Patient Visual Report: Diplopia Vision Assessment?: Vision impaired- to be further tested in functional context Tracking/Visual Pursuits: Decreased smoothness of horizontal tracking;Decreased smoothness of vertical tracking Saccades: Additional eye shifts occurred during testing Convergence: Within functional limits Visual Fields: No apparent deficits Perception  Perception: Impaired Praxis Praxis: WFL Cognition Cognition Overall Cognitive Status: Impaired/Different from baseline Arousal/Alertness: Awake/alert Memory: Impaired Memory Impairment: Retrieval deficit Attention: Sustained Sustained Attention: Impaired Sustained Attention Impairment: Verbal basic Awareness: Impaired Awareness Impairment: Emergent impairment Problem Solving: Impaired Problem Solving Impairment: Functional complex Executive Function: Initiating;Self Correcting Initiating: Appears intact Self Correcting: Impaired Self Correcting Impairment: Functional complex Safety/Judgment: Appears intact Brief Interview for Mental Status (BIMS) Repetition of Three Words (First Attempt): 3 Temporal Orientation: Year: Correct Temporal Orientation: Month: Accurate within 5 days Temporal Orientation: Day: Correct Recall: "Sock": Yes, no  cue required Recall: "Blue": Yes, no cue required Recall: "Bed": Yes, after cueing ("a piece of furniture") BIMS Summary Score: 14 Sensation Sensation Light Touch: Impaired by gross assessment Hot/Cold: Appears Intact Proprioception: Appears Intact Stereognosis: Appears Intact Additional Comments: B LE light touch impaired-2/2 neuropathy-pt unable to detect light touch Coordination Gross Motor Movements are Fluid and Coordinated: No Fine Motor Movements are Fluid and Coordinated: No Coordination and Movement Description: limited 2/2 L hemi Finger Nose Finger Test: dysmetria L UE-pt hiting L finger to upper lip 9 Hole Peg Test: 20 sec L, 40 sec R Motor  Motor Motor: Hemiplegia Motor - Skilled Clinical Observations: L hemi  Trunk/Postural Assessment  Cervical Assessment Cervical Assessment: Exceptions to [facility] Thoracic Assessment Thoracic Assessment: Exceptions to [facility] Lumbar Assessment Lumbar Assessment: Exceptions to [facility] Postural Control Postural Control: Deficits on evaluation Righting Reactions: delayed Protective Responses: delayed  Balance Balance Balance Assessed: Yes Static Sitting Balance Static Sitting - Level of Assistance: 5: Stand by assistance Dynamic Sitting Balance Dynamic Sitting - Level of Assistance: 4: Min assist Sitting balance - Comments: sitting EOB Static Standing Balance Static Standing - Balance Support: Bilateral upper extremity supported Static Standing - Level of Assistance: 5: Stand by assistance Dynamic Standing Balance Dynamic Standing - Balance Support: Bilateral upper extremity supported Dynamic Standing - Level of Assistance: 4: Min assist Extremity/Trunk Assessment RUE Assessment RUE Assessment: Within Functional Limits General Strength Comments: 4/5 overall,  grip 78 lbs R LUE Assessment LUE Assessment: Exceptions to [facility] General Strength Comments: 3-/5 prox, 3+/5 distal with 55 lbs L  Care Tool Care Tool Self Care Eating    Eating Assist Level: Set up assist    Oral Care    Oral Care Assist Level: Contact Guard/Toucning assist    Bathing   Body parts bathed by patient: Right arm;Left arm;Chest;Abdomen;Front perineal  area;Face Body parts bathed by helper: Right upper leg;Left upper leg;Right lower leg;Left lower leg   Assist Level: Maximal Assistance - Patient 24 - 49%    Upper Body Dressing(including orthotics)   What is the patient wearing?: Pull over shirt        Lower Body Dressing (excluding footwear)   What is the patient wearing?: Pants      Putting on/Taking off footwear   What is the patient wearing?: Socks Assist for footwear: Total Assistance - Patient < 25%       Care Tool Toileting Toileting activity   Assist for toileting: Maximal Assistance - Patient 25 - 49%     Care Tool Bed Mobility Roll left and right activity   Roll left and right assist level: Contact Guard/Touching assist    Sit to lying activity   Sit to lying assist level: Minimal Assistance - Patient > 75%    Lying to sitting on side of bed activity   Lying to sitting on side of bed assist level: the ability to move from lying on the back to sitting on the side of the bed with no back support.: Minimal Assistance - Patient > 75%     Care Tool Transfers Sit to stand transfer   Sit to stand assist level: Moderate Assistance - Patient 50 - 74%    Chair/bed transfer   Chair/bed transfer assist level: Moderate Assistance - Patient 50 - 74%     Toilet transfer   Assist Level: Maximal Assistance - Patient 24 - 49%     Care Tool Cognition  Expression of Ideas and Wants Expression of Ideas and Wants: 4. Without difficulty (complex and basic) - expresses complex messages without difficulty and with speech that is clear and easy to understand  Understanding Verbal and Non-Verbal Content Understanding Verbal and Non-Verbal Content: 4. Understands (complex and basic) - clear comprehension without cues or repetitions    Memory/Recall Ability Memory/Recall Ability : Current season;That he or she is in a hospital/hospital unit   Refer to Care Plan for Long Term Goals  SHORT TERM GOAL WEEK 1 OT Short Term Goal 1 (Week 1): Pt will perform sit to stand with min A for ADL tasks OT Short Term Goal 2 (Week 1): Pt will stand for 1 min with min A sink side during ADL OT Short Term Goal 3 (Week 1): Pt will access bathroom toilet with commode support with min A SPT  Recommendations for other services: Neuropsych and Therapeutic Recreation  Pet therapy and Stress management   Skilled Therapeutic Intervention ADL ADL Eating: Set up Where Assessed-Eating: Wheelchair Grooming: Contact guard Where Assessed-Grooming: Sitting at sink Upper Body Bathing: Minimal assistance Where Assessed-Upper Body Bathing: Sitting at sink Lower Body Bathing: Maximal assistance Where Assessed-Lower Body Bathing: Standing at sink;Sitting at sink Upper Body Dressing: Minimal assistance Where Assessed-Upper Body Dressing: Sitting at sink Lower Body Dressing: Dependent Where Assessed-Lower Body Dressing: Sitting at sink;Standing at sink Toileting: Moderate assistance Where Assessed-Toileting: Bedside Commode Toilet Transfer: Moderate assistance Toilet Transfer Method: Stand pivot Acupuncturist: Extra wide bedside commode Tub/Shower Transfer: Unable to assess Film/video editor: Unable to assess ADL Comments: max A LB, CGA UB, mod A sit to stand and SPT with + 2 for standby only due to body habitus and height Mobility  Bed Mobility Supine to Sit: Minimal Assistance - Patient > 75% Transfers Sit to Stand: Moderate Assistance - Patient 50-74%  OT Intervention/Treatment:   Pt seen for full initial OT  evaluation and training session this am. Pt in bed upon OT arrival. OT introduced role of therapy and purpose of session. Pt open to all presented assessment and training this visit and able to provide an extended  treatment within evaluation time due to schedule opportunity and pt need.  OT assisted and assessed ADL's, mobility, vision, sensation. cognition/lang, G/FMC, strength and balance throughout session. See above for levels. Pt with mild fleeting intermittent diplopia, decreased processing and STM and L> R sided weakness and sensory deficits. Pt will benefit from skilled OT services at CIR to maximize function and safety with recommendation to return home with S for simple BADL's, min A LB ADL's and and CGA for mobility/transfers. Rec HHOT services upon d/c home. Pt left at end of session in recliner with LE's elevated and chair alarm set, tray table and nurse call bell within reach.   Discharge Criteria: Patient will be discharged from OT if patient refuses treatment 3 consecutive times without medical reason, if treatment goals not met, if there is a change in medical status, if patient makes no progress towards goals or if patient is discharged from hospital.  The above assessment, treatment plan, treatment alternatives and goals were discussed and mutually agreed upon: by patient  Vicenta Dunning 06/29/2023, 10:52 PM

## 2023-06-29 NOTE — Plan of Care (Signed)
  Problem: RH Problem Solving Goal: LTG Patient will demonstrate problem solving for (SLP) Description: LTG:  Patient will demonstrate problem solving for basic/complex daily situations with cues  (SLP) Flowsheets (Taken 06/29/2023 1244) LTG: Patient will demonstrate problem solving for (SLP): Complex daily situations LTG Patient will demonstrate problem solving for: Modified Independent   Problem: RH Memory Goal: LTG Patient will demonstrate ability for day to day (SLP) Description: LTG:   Patient will demonstrate ability for day to day recall/carryover during cognitive/linguistic activities with assist  (SLP) Flowsheets (Taken 06/29/2023 1244) LTG: Patient will demonstrate ability for day to day recall: New information LTG: Patient will demonstrate ability for day to day recall/carryover during cognitive/linguistic activities with assist (SLP): Modified Independent   Problem: RH Attention Goal: LTG Patient will demonstrate this level of attention during functional activites (SLP) Description: LTG:  Patient will will demonstrate this level of attention during functional activites (SLP) Flowsheets (Taken 06/29/2023 1244) Patient will demonstrate during cognitive/linguistic activities the attention type of: Sustained Patient will demonstrate this level of attention during cognitive/linguistic activities in: Controlled LTG: Patient will demonstrate this level of attention during cognitive/linguistic activities with assistance of (SLP): Modified Independent

## 2023-06-29 NOTE — Plan of Care (Signed)
  Problem: RH Balance Goal: LTG Patient will maintain dynamic sitting balance (PT) Description: LTG:  Patient will maintain dynamic sitting balance with assistance during mobility activities (PT) Flowsheets (Taken 06/29/2023 1540) LTG: Pt will maintain dynamic sitting balance during mobility activities with:: Independent with assistive device  Goal: LTG Patient will maintain dynamic standing balance (PT) Description: LTG:  Patient will maintain dynamic standing balance with assistance during mobility activities (PT) Flowsheets (Taken 06/29/2023 1540) LTG: Pt will maintain dynamic standing balance during mobility activities with:: Independent with assistive device    Problem: Sit to Stand Goal: LTG:  Patient will perform sit to stand with assistance level (PT) Description: LTG:  Patient will perform sit to stand with assistance level (PT) Flowsheets (Taken 06/29/2023 1540) LTG: PT will perform sit to stand in preparation for functional mobility with assistance level: Independent with assistive device   Problem: RH Bed Mobility Goal: LTG Patient will perform bed mobility with assist (PT) Description: LTG: Patient will perform bed mobility with assistance, with/without cues (PT). Flowsheets (Taken 06/29/2023 1540) LTG: Pt will perform bed mobility with assistance level of: Independent with assistive device    Problem: RH Bed to Chair Transfers Goal: LTG Patient will perform bed/chair transfers w/assist (PT) Description: LTG: Patient will perform bed to chair transfers with assistance (PT). Flowsheets (Taken 06/29/2023 1540) LTG: Pt will perform Bed to Chair Transfers with assistance level: Independent with assistive device    Problem: RH Furniture Transfers Goal: LTG Patient will perform furniture transfers w/assist (OT/PT) Description: LTG: Patient will perform furniture transfers  with assistance (OT/PT). Flowsheets (Taken 06/29/2023 1540) LTG: Pt will perform furniture transfers with  assist:: Independent with assistive device    Problem: RH Ambulation Goal: LTG Patient will ambulate in controlled environment (PT) Description: LTG: Patient will ambulate in a controlled environment, # of feet with assistance (PT). Flowsheets (Taken 06/29/2023 1540) LTG: Pt will ambulate in controlled environ  assist needed:: Supervision/Verbal cueing LTG: Ambulation distance in controlled environment: 150 feet with LRAD Goal: LTG Patient will ambulate in home environment (PT) Description: LTG: Patient will ambulate in home environment, # of feet with assistance (PT). Flowsheets (Taken 06/29/2023 1540) LTG: Pt will ambulate in home environ  assist needed:: Supervision/Verbal cueing LTG: Ambulation distance in home environment: 50 feet with LRAD

## 2023-06-29 NOTE — Progress Notes (Addendum)
Patient reported to be bradycardic and dizzy with therapy attempts this afternoon. HR was noted to be in 40's on pulse ox and checked radially by nurse with confirmation of bradycardia. Per reports was also dizzy with am PT session which was felt to be due to hypoxia.  On exam, patient in bed and NAD. On 2 L oxygen per [place] (was on EKG showed frequent PVCs. Has had persistent hypokalemia which was supplemented with 40 meq KCL. Resume home regimen of 40 in am/20 in pm.  Is a little dry but better than admission. Will recheck BMET in am. CXR also ordered.   Weight down to 339 lbs today--question accurate. Orthostatic vitals and repeat weight ordered. Discussed with cardiology and they plan on seeing him today to give recommendations.

## 2023-06-29 NOTE — Progress Notes (Signed)
Patient ID: Lawrence [name], male   DOB: 1956/04/19, 67 y.o.   MRN: [id number] Met with the patient to follow up on current situation, rehab schedule, team conference and plan of care. Patient with dizziness and bradycardia during therapy; using supplemental oxygen @ 3L/Min [place]. C/O neuropathic pain in feet; asked significant other to bring in shoes when she comes in later today. Reviewed medications and check back after modifications made to medications per PAC Pam (post episode). Patient note this CVA negated "everything he had overcome with his first stroke". Wants to be able to get back to his baseline with "mobility of the left leg; arm is pretty good". Reports feeling "dry as a desert". Reviewed diet modification recommendations and medications taken at home. Continue to follow along to address educational needs to facilitate preparation for discharge. Pamelia Hoit

## 2023-06-30 ENCOUNTER — Inpatient Hospital Stay (HOSPITAL_BASED_OUTPATIENT_CLINIC_OR_DEPARTMENT_OTHER)
Admit: 2023-06-30 | Discharge: 2023-06-30 | Disposition: A | Discharge status: Home / Self Care | Payer: Medicare HMO | Attending: Physician Assistant | Admitting: Physician Assistant

## 2023-06-30 ENCOUNTER — Other Ambulatory Visit: Payer: Self-pay

## 2023-06-30 DIAGNOSIS — I509 Heart failure, unspecified: Secondary | ICD-10-CM

## 2023-06-30 DIAGNOSIS — I639 Cerebral infarction, unspecified: Secondary | ICD-10-CM | POA: Diagnosis not present

## 2023-06-30 DIAGNOSIS — I63542 Cerebral infarction due to unspecified occlusion or stenosis of left cerebellar artery: Secondary | ICD-10-CM

## 2023-06-30 DIAGNOSIS — I5033 Acute on chronic diastolic (congestive) heart failure: Secondary | ICD-10-CM

## 2023-06-30 DIAGNOSIS — I10 Essential (primary) hypertension: Secondary | ICD-10-CM | POA: Diagnosis not present

## 2023-06-30 DIAGNOSIS — N1831 Chronic kidney disease, stage 3a: Secondary | ICD-10-CM

## 2023-06-30 DIAGNOSIS — I480 Paroxysmal atrial fibrillation: Secondary | ICD-10-CM

## 2023-06-30 DIAGNOSIS — E1142 Type 2 diabetes mellitus with diabetic polyneuropathy: Secondary | ICD-10-CM | POA: Diagnosis not present

## 2023-06-30 LAB — BASIC METABOLIC PANEL
Anion gap: 12 (ref 5–15)
BUN: 35 mg/dL — ABNORMAL HIGH (ref 8–23)
CO2: 30 mmol/L (ref 22–32)
Calcium: 9.3 mg/dL (ref 8.9–10.3)
Chloride: 93 mmol/L — ABNORMAL LOW (ref 98–111)
Creatinine, Ser: 1.35 mg/dL — ABNORMAL HIGH (ref 0.61–1.24)
GFR, Estimated: 58 mL/min — ABNORMAL LOW (ref >60)
Glucose, Bld: 147 mg/dL — ABNORMAL HIGH (ref 70–99)
Potassium: 3.2 mmol/L — ABNORMAL LOW (ref 3.5–5.1)
Sodium: 135 mmol/L (ref 135–145)

## 2023-06-30 LAB — GLUCOSE, CAPILLARY
Glucose-Capillary: 193 mg/dL — ABNORMAL HIGH (ref 70–99)
Glucose-Capillary: 124 mg/dL — ABNORMAL HIGH (ref 70–99)
Glucose-Capillary: 163 mg/dL — ABNORMAL HIGH (ref 70–99)
Glucose-Capillary: 145 mg/dL — ABNORMAL HIGH (ref 70–99)

## 2023-06-30 MED ORDER — COLCHICINE 0.3 MG HALF TABLET
0.3000 mg | ORAL_TABLET | Freq: Every day | ORAL | Status: DC
  Administered 2023-07-01 – 2023-07-03 (×3): 0.3 mg via ORAL
  Filled 2023-06-30 (×3): qty 1

## 2023-06-30 MED ORDER — POTASSIUM CHLORIDE CRYS ER 20 MEQ PO TBCR
40.0000 meq | EXTENDED_RELEASE_TABLET | Freq: Two times a day (BID) | ORAL | Status: DC
  Administered 2023-06-30 – 2023-07-03 (×6): 40 meq via ORAL
  Filled 2023-06-30 (×6): qty 2

## 2023-06-30 MED ORDER — POTASSIUM CHLORIDE CRYS ER 20 MEQ PO TBCR: 40.0000 meq | EXTENDED_RELEASE_TABLET | Freq: Once | ORAL | Status: DC

## 2023-06-30 MED ORDER — POTASSIUM CHLORIDE CRYS ER 20 MEQ PO TBCR
40.0000 meq | EXTENDED_RELEASE_TABLET | Freq: Once | ORAL | Status: AC
  Administered 2023-06-30: 40 meq via ORAL
  Filled 2023-06-30: qty 2

## 2023-06-30 NOTE — Progress Notes (Signed)
Ortho vitals taken. Standing dropped to 99/59 hr 46. Patient asymptomatic. See flowsheet.

## 2023-06-30 NOTE — Progress Notes (Signed)
PROGRESS NOTE   Subjective/Complaints: Patient reports he slept well overnight.  Intermittent dizziness reported.  Later in the morning patient reported chest pain with PT and was noted to be bradycardic.  EKG was ordered.  Cardiology starting Zio patch.  Chest pain noted to be resolved later and he was seen by cardiology.    ROS: Patient denies fever, rash, sore throat, blurred vision, nausea, vomiting, diarrhea, cough or chest pain, headache, or mood change. + dizzy when standing SOB- unchanged over past few days Chest pain-resolved later in the morning   Objective:   DG Chest 2 View  Result Date: 06/29/2023 CLINICAL DATA:  Hypoxia. EXAM: CHEST - 2 VIEW COMPARISON:  Chest radiograph dated 06/24/2023. FINDINGS: Cardiomegaly with vascular congestion. No focal consolidation, pleural effusion, pneumothorax. Aortic valve repair. Degenerative changes of the spine. No acute osseous pathology. IMPRESSION: Cardiomegaly with vascular congestion.  No focal consolidation. Electronically Signed   By: Elgie Collard M.D.   On: 06/29/2023 18:57   Recent Labs    06/28/23 0551 06/29/23 0515  WBC 10.4 11.7*  HGB 12.8* 14.1  HCT 40.1 42.8  PLT 291 312   Recent Labs    06/29/23 0515 06/30/23 0646  NA 137 135  K 3.1* 3.2*  CL 97* 93*  CO2 31 30  GLUCOSE 115* 147*  BUN 25* 35*  CREATININE 1.27* 1.35*  CALCIUM 8.8* 9.3    Intake/Output Summary (Last 24 hours) at 06/30/2023 0911 Last data filed at 06/30/2023 0800 Gross per 24 hour  Intake 720 ml  Output 1925 ml  Net -1205 ml        Physical Exam: Vital Signs Blood pressure (!) 144/83, pulse (!) 41, temperature 97.9 F (36.6 C), temperature source Oral, resp. rate 18, height 6\' 2"  (1.88 m), weight (!) 156.8 kg, SpO2 97%.     General: , No apparent distress, morbidly obese HEENT: Head is normocephalic, atraumatic,MMM neck: Supple  Heart: Reg rate and rhythm.  No murmurs  noted Chest: CTA bilaterally without wheezes, rales, or rhonchi; nonlabored breathing Abdomen: Soft, mildly-tender, non-distended, bowel sounds positive. Extremities: No clubbing, cyanosis, 1+ bilateral lower extremity edema Psych: Pt's affect is appropriate. Pt is cooperative Skin: Dry dressing in place to  b/l ankles   Neuro:     Mental Status: AAOx3, memory intact, fund of knowledge appropriate Speech/Languate: Naming and repetition intact, fluent, follows simple commands CRANIAL NERVES: Altered sensation left face to light touch.  Otherwise cranial nerves II through XII grossly intact       MOTOR: RUE: 5/5 Deltoid, 5/5 Biceps, 5/5 Triceps,5/5 Grip LUE: 4/5 Deltoid, 4/5 Biceps, 4/5 Triceps, 4/5 Grip RLE: HF 5/5, KE 5/5, ADF 5/5, APF 5/5 LLE: HF 1-2/5, KE 1/5, ADF 2/5, APF 2/5     No hypertonia noted    SENSORY: Altered sensation to light touch in left upper extremity and left lower extremity.   Assessment/Plan: 1. Functional deficits which require 3+ hours per day of interdisciplinary therapy in a comprehensive inpatient rehab setting. Physiatrist is providing close team supervision and 24 hour management of active medical problems listed below. Physiatrist and rehab team continue to assess barriers to discharge/monitor patient progress toward functional and medical  goals  Care Tool:  Bathing    Body parts bathed by patient: Right arm, Left arm, Chest, Abdomen, Front perineal area, Face   Body parts bathed by helper: Right upper leg, Left upper leg, Right lower leg, Left lower leg     Bathing assist Assist Level: Maximal Assistance - Patient 24 - 49%     Upper Body Dressing/Undressing Upper body dressing   What is the patient wearing?: Pull over shirt    Upper body assist Assist Level: Minimal Assistance - Patient > 75%    Lower Body Dressing/Undressing Lower body dressing      What is the patient wearing?: Pants     Lower body assist Assist for lower  body dressing: Maximal Assistance - Patient 25 - 49%     Toileting Toileting    Toileting assist Assist for toileting: Maximal Assistance - Patient 25 - 49%     Transfers Chair/bed transfer  Transfers assist     Chair/bed transfer assist level: Moderate Assistance - Patient 50 - 74%     Locomotion Ambulation   Ambulation assist   Ambulation activity did not occur: Safety/medical concerns          Walk 10 feet activity   Assist  Walk 10 feet activity did not occur: Safety/medical concerns        Walk 50 feet activity   Assist Walk 50 feet with 2 turns activity did not occur: Safety/medical concerns         Walk 150 feet activity   Assist Walk 150 feet activity did not occur: Safety/medical concerns         Walk 10 feet on uneven surface  activity   Assist Walk 10 feet on uneven surfaces activity did not occur: Safety/medical concerns         Wheelchair     Assist Is the patient using a wheelchair?: Yes Type of Wheelchair: Manual    Wheelchair assist level: Dependent - Patient 0%      Wheelchair 50 feet with 2 turns activity    Assist        Assist Level: Dependent - Patient 0%   Wheelchair 150 feet activity     Assist      Assist Level: Dependent - Patient 0%   Blood pressure (!) 144/83, pulse (!) 41, temperature 97.9 F (36.6 C), temperature source Oral, resp. rate 18, height 6\' 2"  (1.88 m), weight (!) 156.8 kg, SpO2 97%.  Medical Problem List and Plan: 1. Functional deficits secondary to acute ischemic left cerebellar infarct with hx of prior CVA             -patient may shower, please cover RLE skin breakdown             -ELOS/Goals: 16 to 20 days, PT OT supervision to min assist, SLP supervision MRI             -Continue CIR 2.  Antithrombotics: -DVT/anticoagulation:  Pharmaceutical: Eliquis             -antiplatelet therapy: N/A 3. Chronic pain/Pain Management: Has spinal cord stimulator. Continue  Tylenol prn.   --will d/c hydrocodone and monitor as denies pain at this time.  --continue robaxin prn  4. Mood/Behavior/Sleep: LCSW to follow for evaluation and support.              -antipsychotic agents: Abilify-->will change to bedtime as at home.              --chronic insomnia  was being managed with Trazodone and Remeron--> d/c per neurology --has PTSD/Nightmares but sleeping well in hospital. Resume Prasozin 2 mg/HS if BP stable.  5. Neuropsych/cognition: This patient is capable of making decisions on his own behalf. 6. Skin/Wound Care: Routine pressure relief measures.              -Bilateral lower extremity venous stasis ulcers.  Continue current wound care with Aquacel and Xeroform 7. Fluids/Electrolytes/Nutrition: Monitor I/O. Check CMET in am. Recheck Mg and Phos level in am8.    8. HTN: BP stable. Resume Prazosin when able  --Wide complex tachycardia: No significant arrhthymias seen per Dr. Tenny Craw.              --metoprolol 12.5 mg bid and can be increased to 25 mg bid.   -11/22 metoprolol was discontinued due to bradycardia 9. T2DM with peripheral neuropathy: Hgb A1C-6.5 and well controlled. Monitor BS ac/hs and use SSI for elevated BS.              --continue insulin glargine 40 units daily with novolog 12 units TID  -11/22 fair control, continue to monitor for now  CBG (last 3)  Recent Labs    06/29/23 1629 06/29/23 2114 06/30/23 0553  GLUCAP 125* 204* 193*    10. Chronic diastolic CHF: Added 1500 cc FR per recommendations. HH diet  --Daily weight and monitor for signs of overload--weight at admission 11/16- 373 lbs.  --evaluated by cards today-->BB increased to 25 mg BID, Demadex bid. Zaroxolyn X 1 dose --cards will follow for response in am.   11/22 cardiology following-appreciate assistance, cardiology holding off on spironolactone due to dizziness Filed Weights   06/29/23 0500 06/29/23 1420 06/30/23 0500  Weight: (!) 153.9 kg (!) 153.5 kg (!) 156.8 kg    11.  Wide complex tachycardia/hx PAF: Started metoprolol 25mg  BID by cardiology. On Eliquis  -11/22 Zio started by cardiology, metoprolol was stopped.  Cardiology feels that pulse may be undercalled due to PVCs. 12. Seizure d/o: Has been seizure free on Keppra and Vimpat -->followed by Dr. Teresa Coombs.  13. Major depressive: PTA--was on Effexor, Abilify, Remeron and Trazodone.              --lethargy could be due to daytime Abilify-->will change to nights             --Remeron and trazodone on hold per neuro  -Continue melatonin 14. GAD w/panic attacks/PTSD: Chronic issues onoging and followed by [facility].  --Was on Prazosin and Buspar.             --Mood stable on current dose Buspar. Monitor for now  15. Hypokalemia: Improved with supplementation.  -11/21 potassium lower today at 3.1, will change potassium supplement to 20 mEq twice daily.  Recheck tomorrow  -11/22 K+ up to 3.2, increase to in am and in evening  11/24 potassium supplement increased to 40 mEq twice daily 16. Leucocytosis: WBC trending down-->monitor 17. OSA: Now compliant with CPAP but has been requiring 5 L oxygen per [place].              --question OHS as contributing factor leading to hypersomnia. Wean oxygen as able.   -Appears to be tolerating weaning down nasal O2, he reports he used his CPAP at night 18. COPD: On Trilegy at home -->on Breo/Incruse  Continue to wean off oxygen.   19. Neuropathy: Managed with spinal cord stimulator and Lyrica (Dr. Beverly Gust Shaw/[facility]).  20. Gout flare R great toe: On colchicine  bid for 5 more days, resume allopurinol in 2 to 3 weeks 21. CKD 3A:BUN/Scr-38/1.31 at admission.   -Creatinine a little higher today at 1.35/BUN 35 22. Morbid Obesity. Body mass index is 47.89 kg/m.             -Dietary counseling    LOS: 2 days A FACE TO FACE EVALUATION WAS PERFORMED  Fanny Dance 06/30/2023, 9:11 AM

## 2023-06-30 NOTE — Progress Notes (Signed)
K still low at 3.2. Will give additional 40 meq this am and monitor daily labs

## 2023-06-30 NOTE — Progress Notes (Signed)
Speech Language Pathology Daily Session Note  Patient Details  Name: Lawrence [name] MRN: [id number] Date of Birth: 07-02-1956  Today's Date: 06/30/2023 SLP Individual Time: 0200-0246 SLP Individual Time Calculation (min): 46 min  Short Term Goals: Week 1: SLP Short Term Goal 1 (Week 1): Patient will solve moderately complex problems with 90% acc given sup A SLP Short Term Goal 2 (Week 1): Patient will recall novel information given a 10- 15 minute delay with 90% acc with sup A SLP Short Term Goal 3 (Week 1): Patient will selectively attend to task in a mildly distracting environment for >25 minutes with sup A SLP Short Term Goal 4 (Week 1): Patient will utilize word finding strategies at the sentence level with sup A  Skilled Therapeutic Interventions:   Patient was seen in PM to address cognitive re- training. Pt was alert and seated upright in bed upon SLP arrival. He denied pain and was agreeable for session. SLP introduced WRAP compensatory strategies and examples of utilization. Pt added insight into education including verbalizing uses of his phone, calendar, and tablet as external aids. He also stated his significant other acted as an Event organiser partner. Pt challenged to recall moderate level novel information presented verbally. SLP guided pt in utilization of repetition and association strategies. After a 20 minute distracted delay, pt recalled information with 100% acc. In additional minutes of session, SLP began medication management. Given review of current medication list, pt identified familiar vs unfamiliar medication and rationale for taking them. SLP challenged pt in medication problem solving through identifying solutions to scenarios presented verbally. Pt completed task with 83% acc with sup A. At conclusion of session, pt was left in bed with call button within reach and bed alarm active. SLP to continue POC.  Pain Pain Assessment Pain Scale: 0-10 Pain Score: 5  Pain  Type: Neuropathic pain Pain Location: Foot Pain Orientation: Right  Therapy/Group: Individual Therapy  Renaee Munda 06/30/2023, 3:33 PM

## 2023-06-30 NOTE — IPOC Note (Signed)
Overall Plan of Care [facility]) Patient Details Name: Lawrence [name] MRN: [id number] DOB: [birth year]  Admitting Diagnosis: Acute stroke due to occlusion of left cerebellar artery [facility])  Hospital Problems: Principal Problem:   Acute stroke due to occlusion of left cerebellar artery (HCC) Active Problems:   Primary hypertension   Type 2 diabetes mellitus with diabetic polyneuropathy, with long-term current use of insulin (HCC)     Functional Problem List: Nursing Bowel, Endurance, Medication Management, Motor, Pain, Safety, Sensory  PT Balance, Edema, Endurance, Motor, Pain, Safety, Sensory, Skin Integrity  OT Balance, Edema, Safety, Vision, Endurance, Pain, Sensory, Cognition, Motor, Perception, Skin Integrity  SLP Cognition  TR         Basic ADL's: OT Grooming, Bathing, Dressing, Toileting     Advanced  ADL's: OT Simple Meal Preparation, Laundry, Light Housekeeping     Transfers: PT Bed Mobility, Bed to Chair, Set designer, Occupational psychologist, Research scientist (life sciences): PT Ambulation, Psychologist, prison and probation services     Additional Impairments: OT Fuctional Use of Upper Extremity  SLP Swallowing, Social Cognition, Communication expression Problem Solving, Memory, Attention, Awareness  TR      Anticipated Outcomes Item Anticipated Outcome  Self Feeding mod I  Swallowing  Mod I   Basic self-care  Supervision UB, min A LB  Toileting  Supervision   Bathroom Transfers CGA  Bowel/Bladder  continent of B/B  Transfers  mod I  Locomotion  supervision  Communication  mod I  Cognition  Mod I  Pain  less than 4  Safety/Judgment  with cues   Therapy Plan: PT Intensity: Minimum of 1-2 x/day ,45 to 90 minutes PT Frequency: 5 out of 7 days PT Duration Estimated Length of Stay: 16-20 days OT Intensity: Minimum of 1-2 x/day, 45 to 90 minutes OT Frequency: 5 out of 7 days OT Duration/Estimated Length of Stay: 16- 20 days SLP Intensity: Minumum of 1-2 x/day, 30 to 90 minutes SLP  Frequency: 1 to 3 out of 7 days SLP Duration/Estimated Length of Stay: 16-20 days   Team Interventions: Nursing Interventions Patient/Family Education, Bowel Management, Disease Management/Prevention, Pain Management, Medication Management, Discharge Planning, Psychosocial Support  PT interventions Ambulation/gait training, Discharge planning, Psychosocial support, Functional mobility training, Therapeutic Activities, Visual/perceptual remediation/compensation, Balance/vestibular training, Disease management/prevention, Neuromuscular re-education, Skin care/wound management, Therapeutic Exercise, Wheelchair propulsion/positioning, Cognitive remediation/compensation, DME/adaptive equipment instruction, Pain management, Splinting/orthotics, UE/LE Strength taining/ROM, Firefighter, Equities trader education, Museum/gallery curator, UE/LE Coordination activities  OT Interventions Warden/ranger, Community reintegration, Disease mangement/prevention, Development worker, international aid stimulation, Neuromuscular re-education, Equities trader education, Self Care/advanced ADL retraining, Splinting/orthotics, Therapeutic Exercise, UE/LE Coordination activities, Wheelchair propulsion/positioning, Cognitive remediation/compensation, Discharge planning, DME/adaptive equipment instruction, Functional mobility training, Pain management, Psychosocial support, Skin care/wound managment, Therapeutic Activities, UE/LE Strength taining/ROM, Visual/perceptual remediation/compensation  SLP Interventions Cognitive remediation/compensation, Internal/external aids, Speech/Language facilitation, Therapeutic Activities, Functional tasks, Patient/family education, Dysphagia/aspiration precaution training  TR Interventions    SW/CM Interventions Discharge Planning, Psychosocial Support, Patient/Family Education   Barriers to Discharge MD  Medical stability and Wound care  Nursing Decreased caregiver support, Lack of/limited  family support, Weight, Medication compliance l level with level entry  PT Home environment access/layout, Wound Care, Weight, New oxygen dizziness, pain, neuropathy  OT Wound Care, Weight, New oxygen body habitus, O2 needs  SLP      SW Decreased caregiver support, Lack of/limited family support, Weight     Team Discharge Planning: Destination: PT-Home ,OT- Home , SLP-Home Projected Follow-up: PT-Home health PT, OT-  Home health OT, SLP-None Projected Equipment Needs:  PT-To be determined, OT- Wheelchair (measurements), Wheelchair cushion (measurements), SLP-None recommended by SLP Equipment Details: PT-pt has rollator, electric scooter, and BSC, OT-may need w/c and cushion (bari) Patient/family involved in discharge planning: PT- Patient,  OT-Patient, SLP-Patient  MD ELOS: 16-20 Medical Rehab Prognosis:  Good Assessment: The patient has been admitted for CIR therapies with the diagnosis of acute ischemic left cerebellar infarct with hx of prior CVA . The team will be addressing functional mobility, strength, stamina, balance, safety, adaptive techniques and equipment, self-care, bowel and bladder mgt, patient and caregiver education. Goals have been set at Supervision. Anticipated discharge destination is home.        See Team Conference Notes for weekly updates to the plan of care

## 2023-06-30 NOTE — Progress Notes (Signed)
Will place a live zio x 3 days for bradycardia with PT.   Dr. Mayford Knife to read.

## 2023-06-30 NOTE — Progress Notes (Signed)
Rounding Note    Patient Name: Lawrence [name] Date of Encounter: 06/30/2023  [facility] Cardiologist: Armanda Magic, MD / CHF service  Subjective   PT comfortable in bed   Was dizzy earlier with PT   Still on O2 (3L)  Says he was not on at home   Inpatient Medications    Scheduled Meds:  apixaban  5 mg Oral BID   ARIPiprazole  5 mg Oral Q supper   busPIRone  10 mg Oral TID   cholecalciferol  2,000 Units Oral Daily   [START ON 07/01/2023] colchicine  0.3 mg Oral Daily   cyanocobalamin  1,000 mcg Oral Daily   fenofibrate  160 mg Oral Daily   ferrous sulfate  325 mg Oral Q breakfast   fluticasone furoate-vilanterol  1 puff Inhalation Daily   And   umeclidinium bromide  1 puff Inhalation Daily   icosapent Ethyl  2 g Oral BID   insulin aspart  0-5 Units Subcutaneous QHS   insulin aspart  0-9 Units Subcutaneous TID WC   insulin aspart  12 Units Subcutaneous TID WC   insulin glargine-yfgn  40 Units Subcutaneous Q24H   lacosamide  150 mg Oral BID   levETIRAcetam  500 mg Oral BID   pantoprazole  40 mg Oral Daily   potassium chloride  20 mEq Oral Q supper   potassium chloride  40 mEq Oral Q breakfast   pregabalin  150 mg Oral TID   rosuvastatin  20 mg Oral Q supper   spironolactone  25 mg Oral Daily   torsemide  120 mg Oral BID   venlafaxine XR  225 mg Oral Q breakfast   Continuous Infusions:   PRN Meds: acetaminophen, alum & mag hydroxide-simeth, bisacodyl, diphenhydrAMINE, guaiFENesin-dextromethorphan, melatonin, methocarbamol, prochlorperazine **OR** prochlorperazine **OR** prochlorperazine, sodium phosphate   Vital Signs    Vitals:   06/30/23 0539 06/30/23 0613 06/30/23 1304 06/30/23 1307  BP: (!) 99/59 (!) 144/83 117/69   Pulse: (!) 46 (!) 41 (!) 49   Resp:   19   Temp:   (!) 97.1 F (36.2 C) (!) 97.1 F (36.2 C)  TempSrc:   Oral Oral  SpO2:  97% 96%   Weight:      Height:        Intake/Output Summary (Last 24 hours) at 06/30/2023 1349 Last  data filed at 06/30/2023 1233 Gross per 24 hour  Intake 716 ml  Output 1425 ml  Net -709 ml      06/30/2023    5:00 AM 06/29/2023    2:20 PM 06/29/2023    5:00 AM  Last 3 Weights  Weight (lbs) 345 lb 10.9 oz 338 lb 8 oz 339 lb 4.6 oz  Weight (kg) 156.8 kg 153.543 kg 153.9 kg      Telemetry    No tele  Zio just hooked up    - Personally Reviewed  ECG    No new  - Personally Reviewed  Physical Exam  \ GEN: Morbidly obese 67 yo in no  acute distress.  Neck: Neck full  Cardiac: RRR, no murmur Respiratory: Clear to auscultation GI: Obese  Not tense   MS: Tr LE  edema;   Labs    High Sensitivity Troponin:  No results for input(s): "TROPONINIHS" in the last 720 hours.   Chemistry Recent Labs  Lab 06/27/23 0410 06/28/23 0551 06/29/23 0515 06/30/23 0646  NA 138 139 137 135  K 3.2* 3.6 3.1* 3.2*  CL 99  101 97* 93*  CO2 31 29 31 30   GLUCOSE 139* 91 115* 147*  BUN 23 23 25* 35*  CREATININE 1.37* 1.04 1.27* 1.35*  CALCIUM 8.4* 8.7* 8.8* 9.3  MG 2.5* 2.5* 2.3  --   PROT 6.2* 6.6 7.1  --   ALBUMIN 3.2* 3.4* 3.7  --   AST 18 16 18   --   ALT 17 16 19   --   ALKPHOS 42 44 47  --   BILITOT 0.4 0.7 0.7  --   GFRNONAA 57* >60 >60 58*  ANIONGAP 8 9 9 12     Lipids  Recent Labs  Lab 06/25/23 0426  CHOL 151  TRIG 209*  HDL 30*  LDLCALC 79  CHOLHDL 5.0    Hematology Recent Labs  Lab 06/27/23 0410 06/28/23 0551 06/29/23 0515  WBC 10.7* 10.4 11.7*  RBC 4.29 4.52 4.92  HGB 12.3* 12.8* 14.1  HCT 38.7* 40.1 42.8  MCV 90.2 88.7 87.0  MCH 28.7 28.3 28.7  MCHC 31.8 31.9 32.9  RDW 17.0* 16.4* 16.4*  PLT 264 291 312   Thyroid  Recent Labs  Lab 06/24/23 2328  TSH 1.561    BNPNo results for input(s): "BNP", "PROBNP" in the last 168 hours.  DDimer No results for input(s): "DDIMER" in the last 168 hours.   Radiology    DG Chest 2 View  Result Date: 06/29/2023 CLINICAL DATA:  Hypoxia. EXAM: CHEST - 2 VIEW COMPARISON:  Chest radiograph dated 06/24/2023.  FINDINGS: Cardiomegaly with vascular congestion. No focal consolidation, pleural effusion, pneumothorax. Aortic valve repair. Degenerative changes of the spine. No acute osseous pathology. IMPRESSION: Cardiomegaly with vascular congestion.  No focal consolidation. Electronically Signed   By: Elgie Collard M.D.   On: 06/29/2023 18:57    Cardiac Studies  1. Technically difficult study.   2. Left ventricular ejection fraction, by estimation, is 50 to 55%. The  left ventricle has low normal function. Left ventricular endocardial  border not optimally defined to evaluate regional wall motion. The left  ventricular internal cavity size was  dilated. Left ventricular diastolic function could not be evaluated.   3. Right ventricular systolic function is normal. The right ventricular  size is normal. Mildly increased right ventricular wall thickness.   4. There is no evidence of cardiac tamponade.   5. The mitral valve is grossly normal. No evidence of mitral valve  regurgitation. No evidence of mitral stenosis.   6. The aortic valve was not well visualized. Aortic valve regurgitation  is not visualized. But based on doppler parameter cannot rule out mild  (per max velocity and MG) to moderate aortic stenosis (dimensional index).   7. Aortic ascending aorta is structurally normal, with no evidence of  dilitation.   8. Agitated saline contrast bubble study was inconclusive.   Conclusion(s)/Recommendation(s): No intracardiac source of embolism  detected on this transthoracic study. Consider a transesophageal  echocardiogram to exclude cardiac source of embolism if clinically  indicated.     Patient Profile      Lawrence [name] is a 67 y.o. male with a hx of paroxysmal atrial fibrillation, aortic stenosis status post TAVR, chronic HFpEF, PVCs, seizures, Hypertension, hyperlipidemia, type 2 diabetes, OSA uses BiPAP, who is being seen 06/26/2023 for the evaluation of wide-complex tachycardia at  the request of Dr. Thedore Mins.   Assessment & Plan    1  Rhythm   Pt followed on inpatient service for SR and PVCs     When pulse checked it is  often low   I think this is undercalled due to PVCs He is off of b blocker    Set up for Zio patch today to evaluate burden      2  HFpEF    Volume status is difficult given pt's body habitus but it is improved from a few days ag (On torsemide 120 bid as he as PTA and spironolactone)   He got one dose of Zaroxolyn a couple days ago    Volume may still be up some as he  is still requiring O2 [place]  and he did not prior to admit    WIth dizziness I would back off of spironolactone   ] Increase K to 40 bid    FOllow electrolytes and renal function and exam    Will follow   For questions or updates, please contact [facility] Please consult [email or web address] for contact info under        Signed, Dietrich Pates, MD  06/30/2023, 1:49 PM

## 2023-06-30 NOTE — Progress Notes (Signed)
Physical Therapy Session Note  Patient Details  Name: Lawrence [name] MRN: [id number] Date of Birth: [birth year]  Today's Date: 06/30/2023 PT Individual Time: 1001-1105 PT Individual Time Calculation (min): 64 min   Short Term Goals: Week 1:  PT Short Term Goal 1 (Week 1): pt will perform sit to stand with LRAD and min A PT Short Term Goal 2 (Week 1): Pt will perform bed to chair transfer with LRAD and min A PT Short Term Goal 3 (Week 1): pt will ambulate 20 feet with LRAD and mod A or less  Skilled Therapeutic Interventions/Progress Updates: Pt presents semi-reclined in bed and agreeable to therapy.  Pt Ok'd for increased mobility, nsg performed EKG, same results as yesterday.  Pt transferred sup to sit w/ supervision and siderails.  Pt cued for breathing during exertion.  Pt states chest pain (chronic) of 3/10.  Pt O2 @ on 4 LPM of 97% and HR 45.  PA in to room and states will be getting monitor 2/2 frequency of PVCs which skew HR.  Pt states some lightheadedness but tolerable.  Pt performs sit to stand w/ mod A to complete transfer, but initiates well w/ cues for breathing technique.  Pt amb forward and back 4' x 2 before requiring seated rest.  BP at 117/74 and HR remains in 40's w/ activity w/ O@ remaining > 97%.  Pt amb x 45' w/ RW and CGA including turns.  Pt requires extended seated rest breaks 2/2 fatigue.  Pt missed PT therapy time x 11' for monitor application.  Pt remained sitting in recliner w/ LES elevated, S.O. in room.     Therapy Documentation Precautions:  Precautions Precautions: Fall Precaution Comments: L hemi, Restrictions Weight Bearing Restrictions: No General: PT Amount of Missed Time (min): 11 Minutes PT Missed Treatment Reason: Other (Comment) (pt receiving monitor) Vital Signs:   Pain:3/10 chest, Ok'd for therapy as is chronic pain. Pain Assessment Pain Scale: 0-10 Pain Score: 0-No pain    Therapy/Group: Individual Therapy  [name] 06/30/2023, 11:16 AM

## 2023-06-30 NOTE — Progress Notes (Signed)
Physical Therapy Session Note  Patient Details  Name: Lawrence [name] MRN: [id number] Date of Birth: Feb 15, 1956  Today's Date: 06/30/2023 PT Individual Time: 0840-0920 PT Individual Time Calculation (min): 40 min   Short Term Goals: Week 1:  PT Short Term Goal 1 (Week 1): pt will perform sit to stand with LRAD and min A PT Short Term Goal 2 (Week 1): Pt will perform bed to chair transfer with LRAD and min A PT Short Term Goal 3 (Week 1): pt will ambulate 20 feet with LRAD and mod A or less  Skilled Therapeutic Interventions/Progress Updates:    Pt presents in bed and agreeable and eager for therapies. Cleared with RN about no new cardiology recommendations and just continue to monitor BP and HR during session.  BP assessed in supine with HOB elevated and pt denies any dizziness at this time. Pt currently on 4L with O2 = 99-100% so decreased to 3 L per orders and remained > 94% throughout session. Supervision to come to EOB with extra time and denies any dizziness. BP actually increased slightly and pt instructed in seated LE therex including LAQ and marches for improved circulation and overall general strengthening/sitting tolerance. BP and HR monitored while EOB. BP stable and then noted HR to increase at times with activity up to 60-80 bpm but then rapidly drop back down to the low 40's and remain (this was same as start of session). Scooted EOB towards head of bed with same result. Was about to attempt to stand but then noted on monitor that HR was dropping below 40, 36-37 bpm. RN notified and continued to monitor EOB. Pt then reporting feeling more foggy headed and closed eyes once. Returned to supine and scooted up in bed independently. Vitals assessed again and HR continuing with same pattern and then dropped to 35-42 again. Pt starts reporting palpitations and chest pain reported as tightness in lateral band at center of sternum.   Stayed with patient while RN communicating with MD and  getting EKG machine - pain increasing per pt report towards L side of ribs and then to R. Hand off to nurses in room to perform EKG. (Did not bill patient for this time)  Therapy Documentation Precautions:  Precautions Precautions: Fall Precaution Comments: L hemi, Restrictions Weight Bearing Restrictions: No General:   Vital Signs: 112/71 mmHg; HR = 41 bpm in bed with HOB elevated 124/63 mmHg; HR = 42 bpm seated EOB 128/64 mmHg; HR = 37-41 bpm seated EOB after activity and about 3 min  SpO2: 97 % O2 Device: Nasal Cannula O2 Flow Rate (L/min): 4 L/min Pain: No pain initially. Chest pain began towards end of session- RN and MD aware.        Therapy/Group: Individual Therapy  Karolee Stamps Darrol Poke, PT, DPT, CBIS  06/30/2023, 9:38 AM

## 2023-06-30 NOTE — Progress Notes (Signed)
Occupational Therapy Session Note  Patient Details  Name: Lawrence [name] MRN: [id number] Date of Birth: 1955/08/24  Today's Date: 06/30/2023 OT Individual Time: 1445-1530 OT Individual Time Calculation (min): 45 min    Short Term Goals: Week 1:  OT Short Term Goal 1 (Week 1): Pt will perform sit to stand with min A for ADL tasks OT Short Term Goal 2 (Week 1): Pt will stand for 1 min with min A sink side during ADL OT Short Term Goal 3 (Week 1): Pt will access bathroom toilet with commode support with min A SPT  Skilled Therapeutic Interventions/Progress Updates:   Pt seen for skilled OT. Pt just returned to bed after oob all day. Pt agreeable to EOB oral care and UE therex. Pt now with Zio patch monitor and device and cannot get wet so no showers for 3 days. EOB OT progressed L UE coordination and sitting balance as well as breathing integration as pt has had several low HR and dizziness episodes thus O2 now 3 ltrs via [place]. Completed activity EOB with evidence of weakness in L UE however integrated 80% of time. Training with yellow tband for between visits 10 reps x 3 sets triceps press. Cues for pacing and breathing integration. Pt able to return bed level with CGA and increased effort for L LE. Left pt bed level with all needs, nurse call button and chair alarm set.    Pain: B LE 5/10 with relief with distraction, therex, re-wrapping ACE wraps that came loose and elevation to 4/10   Therapy Documentation Precautions:  Precautions Precautions: Fall Precaution Comments: L hemi, Restrictions Weight Bearing Restrictions: No  Therapy/Group: Individual Therapy  Vicenta Dunning 06/30/2023, 3:23 PM

## 2023-07-01 DIAGNOSIS — I63542 Cerebral infarction due to unspecified occlusion or stenosis of left cerebellar artery: Secondary | ICD-10-CM | POA: Diagnosis not present

## 2023-07-01 LAB — GLUCOSE, CAPILLARY
Glucose-Capillary: 181 mg/dL — ABNORMAL HIGH (ref 70–99)
Glucose-Capillary: 89 mg/dL (ref 70–99)
Glucose-Capillary: 93 mg/dL (ref 70–99)
Glucose-Capillary: 187 mg/dL — ABNORMAL HIGH (ref 70–99)

## 2023-07-01 NOTE — Progress Notes (Signed)
Rounding Note    Patient Name: Lawrence [name] Date of Encounter: 07/01/2023  [facility] Cardiologist: Armanda Magic, MD / CHF service  Subjective   Pt in bed Breathing is fair     Inpatient Medications    Scheduled Meds:  apixaban  5 mg Oral BID   ARIPiprazole  5 mg Oral Q supper   busPIRone  10 mg Oral TID   cholecalciferol  2,000 Units Oral Daily   colchicine  0.3 mg Oral Daily   cyanocobalamin  1,000 mcg Oral Daily   fenofibrate  160 mg Oral Daily   ferrous sulfate  325 mg Oral Q breakfast   fluticasone furoate-vilanterol  1 puff Inhalation Daily   And   umeclidinium bromide  1 puff Inhalation Daily   icosapent Ethyl  2 g Oral BID   insulin aspart  0-5 Units Subcutaneous QHS   insulin aspart  0-9 Units Subcutaneous TID WC   insulin aspart  12 Units Subcutaneous TID WC   insulin glargine-yfgn  40 Units Subcutaneous Q24H   lacosamide  150 mg Oral BID   levETIRAcetam  500 mg Oral BID   pantoprazole  40 mg Oral Daily   potassium chloride  40 mEq Oral BID   pregabalin  150 mg Oral TID   rosuvastatin  20 mg Oral Q supper   torsemide  120 mg Oral BID   venlafaxine XR  225 mg Oral Q breakfast   Continuous Infusions:   PRN Meds: acetaminophen, alum & mag hydroxide-simeth, bisacodyl, diphenhydrAMINE, guaiFENesin-dextromethorphan, melatonin, methocarbamol, prochlorperazine **OR** prochlorperazine **OR** prochlorperazine, sodium phosphate   Vital Signs    Vitals:   06/30/23 1307 06/30/23 1955 07/01/23 0309 07/01/23 0745  BP:  139/72 115/71   Pulse:  (!) 41 76 (!) 50  Resp:  19 18 18   Temp: (!) 97.1 F (36.2 C) 97.8 F (36.6 C) 98.1 F (36.7 C)   TempSrc: Oral  Oral   SpO2:  95% 98% 100%  Weight:      Height:        Intake/Output Summary (Last 24 hours) at 07/01/2023 1144 Last data filed at 07/01/2023 0813 Gross per 24 hour  Intake 1076 ml  Output 2070 ml  Net -994 ml      06/30/2023    5:00 AM 06/29/2023    2:20 PM 06/29/2023    5:00  AM  Last 3 Weights  Weight (lbs) 345 lb 10.9 oz 338 lb 8 oz 339 lb 4.6 oz  Weight (kg) 156.8 kg 153.543 kg 153.9 kg      Telemetry    No tele    - Personally Reviewed  ECG    No new  - Personally Reviewed  Physical Exam  \ GEN: Morbidly obese 67 yo in no  acute distress.  Neck: Neck full  Cardiac: RRR, no murmurs, Respiratory: Clear to auscultation GI: Obese  Not tense   MS: Tr to 1+ edema;   Labs    High Sensitivity Troponin:  No results for input(s): "TROPONINIHS" in the last 720 hours.   Chemistry Recent Labs  Lab 06/27/23 0410 06/28/23 0551 06/29/23 0515 06/30/23 0646  NA 138 139 137 135  K 3.2* 3.6 3.1* 3.2*  CL 99 101 97* 93*  CO2 31 29 31 30   GLUCOSE 139* 91 115* 147*  BUN 23 23 25* 35*  CREATININE 1.37* 1.04 1.27* 1.35*  CALCIUM 8.4* 8.7* 8.8* 9.3  MG 2.5* 2.5* 2.3  --   PROT 6.2* 6.6  7.1  --   ALBUMIN 3.2* 3.4* 3.7  --   AST 18 16 18   --   ALT 17 16 19   --   ALKPHOS 42 44 47  --   BILITOT 0.4 0.7 0.7  --   GFRNONAA 57* >60 >60 58*  ANIONGAP 8 9 9 12     Lipids  Recent Labs  Lab 06/25/23 0426  CHOL 151  TRIG 209*  HDL 30*  LDLCALC 79  CHOLHDL 5.0    Hematology Recent Labs  Lab 06/27/23 0410 06/28/23 0551 06/29/23 0515  WBC 10.7* 10.4 11.7*  RBC 4.29 4.52 4.92  HGB 12.3* 12.8* 14.1  HCT 38.7* 40.1 42.8  MCV 90.2 88.7 87.0  MCH 28.7 28.3 28.7  MCHC 31.8 31.9 32.9  RDW 17.0* 16.4* 16.4*  PLT 264 291 312   Thyroid  Recent Labs  Lab 06/24/23 2328  TSH 1.561    BNPNo results for input(s): "BNP", "PROBNP" in the last 168 hours.  DDimer No results for input(s): "DDIMER" in the last 168 hours.   Radiology    DG Chest 2 View  Result Date: 06/29/2023 CLINICAL DATA:  Hypoxia. EXAM: CHEST - 2 VIEW COMPARISON:  Chest radiograph dated 06/24/2023. FINDINGS: Cardiomegaly with vascular congestion. No focal consolidation, pleural effusion, pneumothorax. Aortic valve repair. Degenerative changes of the spine. No acute osseous pathology.  IMPRESSION: Cardiomegaly with vascular congestion.  No focal consolidation. Electronically Signed   By: Elgie Collard M.D.   On: 06/29/2023 18:57    Cardiac Studies  1. Technically difficult study.   2. Left ventricular ejection fraction, by estimation, is 50 to 55%. The  left ventricle has low normal function. Left ventricular endocardial  border not optimally defined to evaluate regional wall motion. The left  ventricular internal cavity size was  dilated. Left ventricular diastolic function could not be evaluated.   3. Right ventricular systolic function is normal. The right ventricular  size is normal. Mildly increased right ventricular wall thickness.   4. There is no evidence of cardiac tamponade.   5. The mitral valve is grossly normal. No evidence of mitral valve  regurgitation. No evidence of mitral stenosis.   6. The aortic valve was not well visualized. Aortic valve regurgitation  is not visualized. But based on doppler parameter cannot rule out mild  (per max velocity and MG) to moderate aortic stenosis (dimensional index).   7. Aortic ascending aorta is structurally normal, with no evidence of  dilitation.   8. Agitated saline contrast bubble study was inconclusive.   Conclusion(s)/Recommendation(s): No intracardiac source of embolism  detected on this transthoracic study. Consider a transesophageal  echocardiogram to exclude cardiac source of embolism if clinically  indicated.     Patient Profile      Lawrence [name] is a 67 y.o. male with a hx of paroxysmal atrial fibrillation, aortic stenosis status post TAVR, chronic HFpEF, PVCs, seizures, Hypertension, hyperlipidemia, type 2 diabetes, OSA uses BiPAP, who is being seen 06/26/2023 for the evaluation of wide-complex tachycardia at the request of Dr. Thedore Mins.   Assessment & Plan    1  Rhythm   Pt followed on inpatient service for SR and PVCs     Pulse noted to be slow today -- possibly PVCs   Continue  betablocker Off telemetry ON b blocker  Zio monitor in place    2  HFpEF    Volume status is difficult given pt's body habitus But, he complains of SOB and he is still requiring  O2 [place]   He was not on piror to hospitalization.  Does not appear to be terribly volume overloaded though difficult to assess.   For questions or updates, please contact [facility] Please consult [email or web address] for contact info under        Signed, Maurice Small, MD  07/01/2023, 11:44 AM

## 2023-07-01 NOTE — Progress Notes (Signed)
PROGRESS NOTE   Subjective/Complaints: No acute complaints.  No events overnight.  Patient denies any increased peripheral edema today.  Does complain of some cramping and pain in his bilateral feet, which he thinks is secondary to neuropathy.  He does not endorse being on any medications for this, however on chart review he is on max dose Lyrica and venlafaxine.  Vital stable, no complaints.   ROS: Patient denies fever, rash, sore throat, blurred vision, nausea, vomiting, diarrhea, cough or chest pain, headache, or mood change. + dizzy when standing-none endorsed today SOB- unchanged over past few days Chest pain-resolved later in the morning Peripheral neuropathy-new, worsening  Objective:   DG Chest 2 View  Result Date: 06/29/2023 CLINICAL DATA:  Hypoxia. EXAM: CHEST - 2 VIEW COMPARISON:  Chest radiograph dated 06/24/2023. FINDINGS: Cardiomegaly with vascular congestion. No focal consolidation, pleural effusion, pneumothorax. Aortic valve repair. Degenerative changes of the spine. No acute osseous pathology. IMPRESSION: Cardiomegaly with vascular congestion.  No focal consolidation. Electronically Signed   By: Elgie Collard M.D.   On: 06/29/2023 18:57   Recent Labs    06/29/23 0515  WBC 11.7*  HGB 14.1  HCT 42.8  PLT 312   Recent Labs    06/29/23 0515 06/30/23 0646  NA 137 135  K 3.1* 3.2*  CL 97* 93*  CO2 31 30  GLUCOSE 115* 147*  BUN 25* 35*  CREATININE 1.27* 1.35*  CALCIUM 8.8* 9.3    Intake/Output Summary (Last 24 hours) at 07/01/2023 1354 Last data filed at 07/01/2023 1255 Gross per 24 hour  Intake 1080 ml  Output 2995 ml  Net -1915 ml        Physical Exam: Vital Signs Blood pressure 122/65, pulse (!) 42, temperature 98.2 F (36.8 C), temperature source Oral, resp. rate 18, height 6\' 2"  (1.88 m), weight (!) 156.8 kg, SpO2 98%.     General: , No apparent distress, morbidly obese.  Laying in  bed. HEENT: Head is normocephalic, atraumatic,MMM neck: Supple  Heart: Reg rate and rhythm.  No murmurs, rubs, or gallops.  Noted Chest: CTA bilaterally without wheezes, rales, or rhonchi; nonlabored breathing Abdomen: Soft, mildly-tender, non-distended, bowel sounds positive. Extremities: No clubbing, cyanosis, trace bilateral lower extremity edema Psych: Pt's affect is appropriate. Pt is cooperative Skin: Dry dressing in place to  b/l ankles, no apparent breakdown.   Neuro:     Mental Status: AAOx3, memory intact, fund of knowledge appropriate Speech/[place]: Naming and repetition intact, fluent, follows simple commands CRANIAL NERVES: Altered sensation left face to light touch.  Otherwise cranial nerves II through XII grossly intact       MOTOR: Moving all 4 extremities in bed  From prior exams: RUE: 5/5 Deltoid, 5/5 Biceps, 5/5 Triceps,5/5 Grip LUE: 4/5 Deltoid, 4/5 Biceps, 4/5 Triceps, 4/5 Grip RLE: HF 5/5, KE 5/5, ADF 5/5, APF 5/5 LLE: HF 1-2/5, KE 1/5, ADF 2/5, APF 2/5     No hypertonia noted    SENSORY: Altered sensation to light touch in left upper extremity and left lower extremity, as well as bilateral feet and all toes   Assessment/Plan: 1. Functional deficits which require 3+ hours per day of interdisciplinary therapy in  a comprehensive inpatient rehab setting. Physiatrist is providing close team supervision and 24 hour management of active medical problems listed below. Physiatrist and rehab team continue to assess barriers to discharge/monitor patient progress toward functional and medical goals  Care Tool:  Bathing    Body parts bathed by patient: Right arm, Left arm, Chest, Abdomen, Front perineal area, Face   Body parts bathed by helper: Right upper leg, Left upper leg, Right lower leg, Left lower leg     Bathing assist Assist Level: Maximal Assistance - Patient 24 - 49%     Upper Body Dressing/Undressing Upper body dressing   What is the patient  wearing?: Pull over shirt    Upper body assist Assist Level: Minimal Assistance - Patient > 75%    Lower Body Dressing/Undressing Lower body dressing      What is the patient wearing?: Pants     Lower body assist Assist for lower body dressing: Maximal Assistance - Patient 25 - 49%     Toileting Toileting    Toileting assist Assist for toileting: Maximal Assistance - Patient 25 - 49%     Transfers Chair/bed transfer  Transfers assist     Chair/bed transfer assist level: Minimal Assistance - Patient > 75%     Locomotion Ambulation   Ambulation assist   Ambulation activity did not occur: Safety/medical concerns  Assist level: Minimal Assistance - Patient > 75% Assistive device: Walker-rolling Max distance: 45   Walk 10 feet activity   Assist  Walk 10 feet activity did not occur: Safety/medical concerns  Assist level: Minimal Assistance - Patient > 75% Assistive device: Walker-rolling   Walk 50 feet activity   Assist Walk 50 feet with 2 turns activity did not occur: Safety/medical concerns         Walk 150 feet activity   Assist Walk 150 feet activity did not occur: Safety/medical concerns         Walk 10 feet on uneven surface  activity   Assist Walk 10 feet on uneven surfaces activity did not occur: Safety/medical concerns         Wheelchair     Assist Is the patient using a wheelchair?: Yes Type of Wheelchair: Manual    Wheelchair assist level: Dependent - Patient 0%      Wheelchair 50 feet with 2 turns activity    Assist        Assist Level: Dependent - Patient 0%   Wheelchair 150 feet activity     Assist      Assist Level: Dependent - Patient 0%   Blood pressure 122/65, pulse (!) 42, temperature 98.2 F (36.8 C), temperature source Oral, resp. rate 18, height 6\' 2"  (1.88 m), weight (!) 156.8 kg, SpO2 98%.  Medical Problem List and Plan: 1. Functional deficits secondary to acute ischemic left  cerebellar infarct with hx of prior CVA             -patient may shower, please cover RLE skin breakdown             -ELOS/Goals: 16 to 20 days, PT OT supervision to min assist, SLP supervision MRI             -Continue CIR 2.  Antithrombotics: -DVT/anticoagulation:  Pharmaceutical: Eliquis             -antiplatelet therapy: N/A 3. Chronic pain/Pain Management: Has spinal cord stimulator. Continue Tylenol prn.   --will d/c hydrocodone and monitor as denies pain at this time.  --  continue robaxin prn  4. Mood/Behavior/Sleep: LCSW to follow for evaluation and support.              -antipsychotic agents: Abilify-->will change to bedtime as at home.              --chronic insomnia was being managed with Trazodone and Remeron--> d/c per neurology --has PTSD/Nightmares but sleeping well in hospital. Resume Prasozin 2 mg/HS if BP stable.  5. Neuropsych/cognition: This patient is capable of making decisions on his own behalf. 6. Skin/Wound Care: Routine pressure relief measures.              -Bilateral lower extremity venous stasis ulcers.  Continue current wound care with Aquacel and Xeroform 7. Fluids/Electrolytes/Nutrition: Monitor I/O. Check CMET in am. Recheck Mg and Phos level in am8.    8. HTN: BP stable. Resume Prazosin when able  --Wide complex tachycardia: No significant arrhthymias seen per Dr. Tenny Craw.              --metoprolol 12.5 mg bid and can be increased to 25 mg bid.   -11/22 metoprolol was discontinued due to bradycardia    07/01/2023   12:52 PM 07/01/2023    7:45 AM 07/01/2023    3:09 AM  Vitals with BMI  Systolic 122  115  Diastolic 65  71  Pulse 42 50 76    -11-23: Normotensive, mild intermittent bradycardia, appreciate cardiology assistance and medication titration  9. T2DM with peripheral neuropathy: Hgb A1C-6.5 and well controlled. Monitor BS ac/hs and use SSI for elevated BS.              --continue insulin glargine 40 units daily with novolog 12 units TID  -11/22  fair control, continue to monitor for now  CBG (last 3)  Recent Labs    06/30/23 2107 07/01/23 0621 07/01/23 1123  GLUCAP 145* 181* 89    10. Chronic diastolic CHF: Added 1500 cc FR per recommendations. HH diet  --Daily weight and monitor for signs of overload--weight at admission 11/16- 373 lbs.  --evaluated by cards today-->BB increased to 25 mg BID, Demadex bid. Zaroxolyn X 1 dose --cards will follow for response in am.   11/22 cardiology following-appreciate assistance, cardiology holding off on spironolactone due to dizziness 11-23: No new weights overnight, no apparent fluid overload, continue current regimen Filed Weights   06/29/23 0500 06/29/23 1420 06/30/23 0500  Weight: (!) 153.9 kg (!) 153.5 kg (!) 156.8 kg    11. Wide complex tachycardia/hx PAF: Started metoprolol 25mg  BID by cardiology. On Eliquis  -11/22 Zio started by cardiology, metoprolol was stopped.  Cardiology feels that pulse may be undercalled due to PVCs. 12. Seizure d/o: Has been seizure free on Keppra and Vimpat -->followed by Dr. Teresa Coombs.  13. Major depressive: PTA--was on Effexor, Abilify, Remeron and Trazodone.              --lethargy could be due to daytime Abilify-->will change to nights             --Remeron and trazodone on hold per neuro  -Continue melatonin 14. GAD w/panic attacks/PTSD: Chronic issues onoging and [facility].  --Was on Prazosin and Buspar.             --Mood stable on current dose Buspar. Monitor for now  15. Hypokalemia: Improved with supplementation.  -11/21 potassium lower today at 3.1, will change potassium supplement to 20 mEq twice daily.  Recheck tomorrow  -11/22 K+ up to 3.2, increase to  in am and in evening  -11/22 potassium supplement increased to 40 mEq twice daily; repeat on Monday  16. Leucocytosis: WBC trending down-->monitor 17. OSA: Now compliant with CPAP but has been requiring 5 L oxygen per [place].              --question OHS as contributing factor  leading to hypersomnia. Wean oxygen as able.   -Appears to be tolerating weaning down nasal O2, he reports he used his CPAP at night  11-23: Compliant with CPAP, stable on 2 L nasal cannula  18. COPD: On Trilegy at home -->on Breo/Incruse  Continue to wean off oxygen.   19. Neuropathy: Managed with spinal cord stimulator and Lyrica (Dr. Beverly Gust Shaw/[facility]).   -11-23: Patient complaining of worsening symptoms, especially at night.  However, appears already on max dose Lyrica and venlafaxine.  Defer further medication adjustments to primary team.   20. Gout flare R great toe: On colchicine bid for 5 more days, resume allopurinol in 2 to 3 weeks 21. CKD 3A:BUN/Scr-38/1.31 at admission.   -Creatinine a little higher today at 1.35/BUN 35 22. Morbid Obesity. Body mass index is 47.89 kg/m.             -Dietary counseling    LOS: 3 days A FACE TO FACE EVALUATION WAS PERFORMED  Angelina Sheriff 07/01/2023, 1:54 PM

## 2023-07-01 NOTE — Plan of Care (Signed)
  Problem: Consults Goal: RH STROKE PATIENT EDUCATION Description: See Patient Education module for education specifics  Outcome: Progressing   Problem: RH BOWEL ELIMINATION Goal: RH STG MANAGE BOWEL WITH ASSISTANCE Description: STG Manage Bowel with min Assistance. Outcome: Progressing Goal: RH STG MANAGE BOWEL W/MEDICATION W/ASSISTANCE Description: STG Manage Bowel with Medication with min Assistance. Outcome: Progressing   Problem: RH BLADDER ELIMINATION Goal: RH STG MANAGE BLADDER WITH ASSISTANCE Description: STG Manage Bladder With min Assistance Outcome: Progressing   Problem: RH SKIN INTEGRITY Goal: RH STG SKIN FREE OF INFECTION/BREAKDOWN Description: Skin will remain intact and free of infection/breakdown with min assist  Outcome: Progressing Goal: RH STG MAINTAIN SKIN INTEGRITY WITH ASSISTANCE Description: STG Maintain Skin Integrity With min Assistance. Outcome: Progressing   Problem: RH SAFETY Goal: RH STG ADHERE TO SAFETY PRECAUTIONS W/ASSISTANCE/DEVICE Description: STG Adhere to Safety Precautions With min Assistance/Device. Outcome: Progressing Goal: RH STG DECREASED RISK OF FALL WITH ASSISTANCE Description: STG Decreased Risk of Fall With min Assistance. Outcome: Progressing   Problem: RH PAIN MANAGEMENT Goal: RH STG PAIN MANAGED AT OR BELOW PT'S PAIN GOAL Description: Less than 4 with PRN medications min assist  Outcome: Progressing   Problem: RH KNOWLEDGE DEFICIT Goal: RH STG INCREASE KNOWLEDGE OF HYPERTENSION Description: Patient/caregiver will be able to manage cardiac medications and self care from nursing education, nursing handouts, and other resources independently  Outcome: Progressing Goal: RH STG INCREASE KNOWLEGDE OF HYPERLIPIDEMIA Description: Patient/caregiver will be able to manage cholesterol medications and diet/lifestyle modifications to improve HDL of 30 and trig of 209 from nursing education and nursing handouts independently  Outcome:  Progressing Goal: RH STG INCREASE KNOWLEDGE OF STROKE PROPHYLAXIS Description: Patient/caregiver will be able to manage Eliquis, self care, and other medications to reduce the risk of stroke from nursing educations, nursing handouts, and other resources independently  Outcome: Progressing   Problem: Education: Goal: Ability to describe self-care measures that may prevent or decrease complications (Diabetes Survival Skills Education) will improve Outcome: Progressing Goal: Individualized Educational Video(s) Outcome: Progressing   Problem: Coping: Goal: Ability to adjust to condition or change in health will improve Outcome: Progressing   Problem: Fluid Volume: Goal: Ability to maintain a balanced intake and output will improve Outcome: Progressing   Problem: Health Behavior/Discharge Planning: Goal: Ability to identify and utilize available resources and services will improve Outcome: Progressing Goal: Ability to manage health-related needs will improve Outcome: Progressing   Problem: Metabolic: Goal: Ability to maintain appropriate glucose levels will improve Outcome: Progressing   Problem: Nutritional: Goal: Maintenance of adequate nutrition will improve Outcome: Progressing Goal: Progress toward achieving an optimal weight will improve Outcome: Progressing   Problem: Skin Integrity: Goal: Risk for impaired skin integrity will decrease Outcome: Progressing   Problem: Tissue Perfusion: Goal: Adequacy of tissue perfusion will improve Outcome: Progressing

## 2023-07-02 ENCOUNTER — Inpatient Hospital Stay (HOSPITAL_COMMUNITY): Payer: No Typology Code available for payment source

## 2023-07-02 DIAGNOSIS — I63542 Cerebral infarction due to unspecified occlusion or stenosis of left cerebellar artery: Secondary | ICD-10-CM | POA: Diagnosis not present

## 2023-07-02 LAB — GLUCOSE, CAPILLARY
Glucose-Capillary: 177 mg/dL — ABNORMAL HIGH (ref 70–99)
Glucose-Capillary: 104 mg/dL — ABNORMAL HIGH (ref 70–99)
Glucose-Capillary: 112 mg/dL — ABNORMAL HIGH (ref 70–99)
Glucose-Capillary: 121 mg/dL — ABNORMAL HIGH (ref 70–99)

## 2023-07-02 MED ORDER — METOPROLOL TARTRATE 12.5 MG HALF TABLET
12.5000 mg | ORAL_TABLET | Freq: Two times a day (BID) | ORAL | Status: DC
  Administered 2023-07-02 – 2023-07-03 (×2): 12.5 mg via ORAL
  Filled 2023-07-02 (×3): qty 1

## 2023-07-02 NOTE — Plan of Care (Signed)
  Problem: Consults Goal: RH STROKE PATIENT EDUCATION Description: See Patient Education module for education specifics  Outcome: Progressing   Problem: RH BOWEL ELIMINATION Goal: RH STG MANAGE BOWEL WITH ASSISTANCE Description: STG Manage Bowel with min Assistance. Outcome: Progressing Goal: RH STG MANAGE BOWEL W/MEDICATION W/ASSISTANCE Description: STG Manage Bowel with Medication with min Assistance. Outcome: Progressing   Problem: RH BLADDER ELIMINATION Goal: RH STG MANAGE BLADDER WITH ASSISTANCE Description: STG Manage Bladder With min Assistance Outcome: Progressing   Problem: RH SKIN INTEGRITY Goal: RH STG SKIN FREE OF INFECTION/BREAKDOWN Description: Skin will remain intact and free of infection/breakdown with min assist  Outcome: Progressing Goal: RH STG MAINTAIN SKIN INTEGRITY WITH ASSISTANCE Description: STG Maintain Skin Integrity With min Assistance. Outcome: Progressing   Problem: RH SAFETY Goal: RH STG ADHERE TO SAFETY PRECAUTIONS W/ASSISTANCE/DEVICE Description: STG Adhere to Safety Precautions With min Assistance/Device. Outcome: Progressing Goal: RH STG DECREASED RISK OF FALL WITH ASSISTANCE Description: STG Decreased Risk of Fall With min Assistance. Outcome: Progressing   Problem: RH PAIN MANAGEMENT Goal: RH STG PAIN MANAGED AT OR BELOW PT'S PAIN GOAL Description: Less than 4 with PRN medications min assist  Outcome: Progressing   Problem: RH KNOWLEDGE DEFICIT Goal: RH STG INCREASE KNOWLEDGE OF HYPERTENSION Description: Patient/caregiver will be able to manage cardiac medications and self care from nursing education, nursing handouts, and other resources independently  Outcome: Progressing Goal: RH STG INCREASE KNOWLEGDE OF HYPERLIPIDEMIA Description: Patient/caregiver will be able to manage cholesterol medications and diet/lifestyle modifications to improve HDL of 30 and trig of 209 from nursing education and nursing handouts independently  Outcome:  Progressing Goal: RH STG INCREASE KNOWLEDGE OF STROKE PROPHYLAXIS Description: Patient/caregiver will be able to manage Eliquis, self care, and other medications to reduce the risk of stroke from nursing educations, nursing handouts, and other resources independently  Outcome: Progressing   Problem: Education: Goal: Ability to describe self-care measures that may prevent or decrease complications (Diabetes Survival Skills Education) will improve Outcome: Progressing Goal: Individualized Educational Video(s) Outcome: Progressing   Problem: Coping: Goal: Ability to adjust to condition or change in health will improve Outcome: Progressing   Problem: Fluid Volume: Goal: Ability to maintain a balanced intake and output will improve Outcome: Progressing   Problem: Health Behavior/Discharge Planning: Goal: Ability to identify and utilize available resources and services will improve Outcome: Progressing Goal: Ability to manage health-related needs will improve Outcome: Progressing   Problem: Metabolic: Goal: Ability to maintain appropriate glucose levels will improve Outcome: Progressing   Problem: Nutritional: Goal: Maintenance of adequate nutrition will improve Outcome: Progressing Goal: Progress toward achieving an optimal weight will improve Outcome: Progressing   Problem: Skin Integrity: Goal: Risk for impaired skin integrity will decrease Outcome: Progressing   Problem: Tissue Perfusion: Goal: Adequacy of tissue perfusion will improve Outcome: Progressing

## 2023-07-02 NOTE — Progress Notes (Signed)
Occupational Therapy Session Note  Patient Details  Name: Lawrence [name] MRN: [id number] Date of Birth: [birth year]  Today's Date: 07/02/2023 OT Individual Time: 3086-5784 OT Individual Time Calculation (min): 70 min    Short Term Goals: Week 1:  OT Short Term Goal 1 (Week 1): Pt will perform sit to stand with min A for ADL tasks OT Short Term Goal 2 (Week 1): Pt will stand for 1 min with min A sink side during ADL OT Short Term Goal 3 (Week 1): Pt will access bathroom toilet with commode support with min A SPT  Skilled Therapeutic Interventions/Progress Updates:  Pt greeted sitting recliner for skilled OT session with focus on BADL retraining.   Pain: Pt with un-rated "spasms in RLE  OT offering intermediate rest breaks and positioning suggestions throughout session to address pain/fatigue and maximize participation/safety in session.   Functional Transfers: Pt performs STS from recliner with Min A + HDRW.   Vitals: Prior to activity: BP=151/72, HR= lowest recorded 27 BPM; highest recorded 84 BPM, 02= 95 at 3L [place] With UB care: BP=123/79, HR=lowest recorded 29 BPM; highest recorded 84, 02=95 at 3L [place] With LB care: BP=122/71,  HR=lowest recorded 26; highest recorded 80, O2=95 at 3L [place]  Self Care Tasks: Pt sponge-bathes/dresses UB with setup A, increased time and effort. Pt with increased dizziness and "fuzzy headed," requiring extended rest-break to manage symptoms. LB sponge-bathing/dressing with A for B lower-legs and buttock; threading & hiking over bottom/hips. Oral care with setup, sitting in recliner.  Therapeutic Exercise: Pt instructed in x3 [facility] activities with use of yellow thera-putty; gross grasp, isolated pincer grip, and retrieving bead.   Pt remained sitting in recliner with 4Ps assessed and immediate needs met. Pt continues to be appropriate for skilled OT intervention to promote further functional independence in ADLs/IADLs.   Therapy Documentation Precautions:   Precautions Precautions: Fall Precaution Comments: L hemi, Restrictions Weight Bearing Restrictions: No   Therapy/Group: Individual Therapy  Lou Cal, OTR/L, MSOT  07/02/2023, 5:40 AM

## 2023-07-02 NOTE — Progress Notes (Signed)
Speech Language Pathology Daily Session Note  Patient Details  Name: Lawrence [name] MRN: [id number] Date of Birth: 26-Jan-1956  Today's Date: 07/02/2023 SLP Individual Time: 1132-1228 SLP Individual Time Calculation (min): 56 min  Short Term Goals: Week 1: SLP Short Term Goal 1 (Week 1): Patient will solve moderately complex problems with 90% acc given sup A SLP Short Term Goal 2 (Week 1): Patient will recall novel information given a 10- 15 minute delay with 90% acc with sup A SLP Short Term Goal 3 (Week 1): Patient will selectively attend to task in a mildly distracting environment for >25 minutes with sup A SLP Short Term Goal 4 (Week 1): Patient will utilize word finding strategies at the sentence level with sup A  Skilled Therapeutic Interventions:   Pt seen for skilled SLP session to address cognitive goals including visual memory, working memory, attention, and problem solving. Pt recalled 95% of visual information following a 3-5 minute delay.  He participated in multiple rounds of a novel card game with a goal of sequencing number 1-10 while adhering to multiple rules. Pt required min cues to attend to specific rules. He demonstrated carryover of game as rounds progressed. Pt left sitting upright in recliner with chair alarm set and call bell in reach. Continue SLP PoC.   Pain Pain Assessment Pain Scale: 0-10 Pain Score: 0-No pain  Therapy/Group: Individual Therapy  Ellery Plunk 07/02/2023, 12:38 PM

## 2023-07-02 NOTE — Progress Notes (Signed)
   07/02/23 1951  BiPAP/CPAP/SIPAP  BiPAP/CPAP/SIPAP Pt Type Adult  BiPAP/CPAP/SIPAP Resmed  Reason BIPAP/CPAP not in use  (pt has home CPAP machine, no assistance needed)

## 2023-07-02 NOTE — Progress Notes (Signed)
Physical Therapy Session Note  Patient Details  Name: Lawrence [name] MRN: [id number] Date of Birth: November 22, 1955  Today's Date: 07/02/2023 PT Individual Time: 0145-0245 PT Individual Time Calculation (min): 60 min   Short Term Goals: Week 1:  PT Short Term Goal 1 (Week 1): pt will perform sit to stand with LRAD and min A PT Short Term Goal 2 (Week 1): Pt will perform bed to chair transfer with LRAD and min A PT Short Term Goal 3 (Week 1): pt will ambulate 20 feet with LRAD and mod A or less  Skilled Therapeutic Interventions/Progress Updates:   Pt received sitting in recliner. Pt agreeable to PT tx without complaints of pain. Pt on 3LO2.  Therex: STS from recliner chair switching Ues x 10 with it being harder to push to standing using R UE. ISO LAQ with ankle pump x 20; seated hip abduction/adduction x 20; seated clam shell x 20; bil LAQ x 20; wheelchair mobility forward and backward with Ues only, Les only x 92 ft each bout, each technique. STS were min A to initiate progressing to max A with fatigue. Gait with RW 21 ft with PT CGA for functional LE strengthening and WB tolerance. Pt required a seated rest break and was unable to complete second trial due to fatigue. Pt required seated rest breaks with seated therex; demonstrates decreased endurance.   All performed to increase functional strengthening. Pt left in room in recliner, alarm on, all needs within reach. No complaints of pain, only fatigue.      Therapy Documentation Precautions:  Precautions Precautions: Fall Precaution Comments: L hemi, Restrictions Weight Bearing Restrictions: No   Therapy/Group: Individual Therapy  [name] 07/02/2023, 3:42 PM

## 2023-07-02 NOTE — Progress Notes (Signed)
PROGRESS NOTE   Subjective/Complaints: No acute complaints.  No events overnight. Patient with ongoing bilateral lower extremity neuropathy, but not significantly worsened today.  Reviewed EP's note from yesterday, discussed case with them this a.m., strong recommendation that patient resume beta-blocker at a low dose despite bradycardia with suspicion that heart rate is related to missed PVCs; unfortunately unable to interrogate Zio patch or please continuous monitoring in inpatient rehab.  Weights appear to be slightly uptrending, patient denies any worsening lower extremity edema or shortness of breath.  He is on 2 L of oxygen today, endorses he does not wear oxygen at home but believes this is more related to his COPD than his CHF.  ROS: Patient denies fever, rash, sore throat, blurred vision, nausea, vomiting, diarrhea, cough or chest pain, headache, or mood change. + dizzy when standing-not endorsed +SOB-requiring intermittent 2 L nasal cannula-unchanged +Chest pain-resolved   + Peripheral neuropathy-stable  Objective:   No results found. No results for input(s): "WBC", "HGB", "HCT", "PLT" in the last 72 hours.  Recent Labs    06/30/23 0646  NA 135  K 3.2*  CL 93*  CO2 30  GLUCOSE 147*  BUN 35*  CREATININE 1.35*  CALCIUM 9.3    Intake/Output Summary (Last 24 hours) at 07/02/2023 7829 Last data filed at 07/02/2023 0300 Gross per 24 hour  Intake 480 ml  Output 3600 ml  Net -3120 ml        Physical Exam: Vital Signs Blood pressure 129/86, pulse 72, temperature 97.6 F (36.4 C), temperature source Oral, resp. rate 18, height 6\' 2"  (1.88 m), weight (!) 157.2 kg, SpO2 96%.     General: , No apparent distress, morbidly obese.  Sitting up in bedside chair. HEENT: Head is normocephalic, atraumatic,MMM neck: Supple.  No JVD. Heart: Reg rate and rhythm.  No murmurs, rubs, or gallops.  Noted Chest: CTA  bilaterally without wheezes, rales, or rhonchi; nonlabored breathing.  On 2 L nasal cannula. Abdomen: Soft, mildly-distended, nontender, bowel sounds positive. Extremities: No clubbing, cyanosis, trace bilateral lower extremity edema-unchanged from prior exam.  Bilateral lower extremity wraps. Psych: Appropriate mood and affect, cooperative Skin: Dry dressing in place to  b/l ankles, no apparent breakdown.   Neuro:  Mental Status: AAOx3, memory intact, fund of knowledge appropriate.  No apparent deficits. Cranial nerves: positive left trigeminal sensory deficit.   MOTOR: Moving all 4 extremities antigravity and against resistance.  From prior exams: RUE: 5/5 Deltoid, 5/5 Biceps, 5/5 Triceps,5/5 Grip LUE: 4/5 Deltoid, 4/5 Biceps, 4/5 Triceps, 4/5 Grip RLE: HF 5/5, KE 5/5, ADF 5/5, APF 5/5 LLE: HF 1-2/5, KE 1/5, ADF 2/5, APF 2/5     No hypertonia noted    SENSORY: Altered sensation to light touch in left upper extremity and left lower extremity, as well as bilateral feet and all toes   Assessment/Plan: 1. Functional deficits which require 3+ hours per day of interdisciplinary therapy in a comprehensive inpatient rehab setting. Physiatrist is providing close team supervision and 24 hour management of active medical problems listed below. Physiatrist and rehab team continue to assess barriers to discharge/monitor patient progress toward functional and medical goals  Care Tool:  Bathing  Body parts bathed by patient: Right arm, Left arm, Chest, Abdomen, Front perineal area, Face   Body parts bathed by helper: Right upper leg, Left upper leg, Right lower leg, Left lower leg     Bathing assist Assist Level: Maximal Assistance - Patient 24 - 49%     Upper Body Dressing/Undressing Upper body dressing   What is the patient wearing?: Pull over shirt    Upper body assist Assist Level: Minimal Assistance - Patient > 75%    Lower Body Dressing/Undressing Lower body dressing       What is the patient wearing?: Pants     Lower body assist Assist for lower body dressing: Maximal Assistance - Patient 25 - 49%     Toileting Toileting    Toileting assist Assist for toileting: Maximal Assistance - Patient 25 - 49%     Transfers Chair/bed transfer  Transfers assist     Chair/bed transfer assist level: Minimal Assistance - Patient > 75%     Locomotion Ambulation   Ambulation assist   Ambulation activity did not occur: Safety/medical concerns  Assist level: Minimal Assistance - Patient > 75% Assistive device: Walker-rolling Max distance: 45   Walk 10 feet activity   Assist  Walk 10 feet activity did not occur: Safety/medical concerns  Assist level: Minimal Assistance - Patient > 75% Assistive device: Walker-rolling   Walk 50 feet activity   Assist Walk 50 feet with 2 turns activity did not occur: Safety/medical concerns         Walk 150 feet activity   Assist Walk 150 feet activity did not occur: Safety/medical concerns         Walk 10 feet on uneven surface  activity   Assist Walk 10 feet on uneven surfaces activity did not occur: Safety/medical concerns         Wheelchair     Assist Is the patient using a wheelchair?: Yes Type of Wheelchair: Manual    Wheelchair assist level: Dependent - Patient 0%      Wheelchair 50 feet with 2 turns activity    Assist        Assist Level: Dependent - Patient 0%   Wheelchair 150 feet activity     Assist      Assist Level: Dependent - Patient 0%   Blood pressure 129/86, pulse 72, temperature 97.6 F (36.4 C), temperature source Oral, resp. rate 18, height 6\' 2"  (1.88 m), weight (!) 157.2 kg, SpO2 96%.  Medical Problem List and Plan: 1. Functional deficits secondary to acute ischemic left cerebellar infarct with hx of prior CVA             -patient may shower, please cover RLE skin breakdown             -ELOS/Goals: 16 to 20 days, PT OT supervision to min  assist, SLP supervision MRI             -Continue CIR  2.  Antithrombotics: -DVT/anticoagulation:  Pharmaceutical: Eliquis             -antiplatelet therapy: N/A 3. Chronic pain/Pain Management: Has spinal cord stimulator. Continue Tylenol prn.   --will d/c hydrocodone and monitor as denies pain at this time.  --continue robaxin prn  4. Mood/Behavior/Sleep: LCSW to follow for evaluation and support.              -antipsychotic agents: Abilify-->will change to bedtime as at home.              --  chronic insomnia was being managed with Trazodone and Remeron--> d/c per neurology --has PTSD/Nightmares but sleeping well in hospital. Resume Prasozin 2 mg/HS if BP stable.  5. Neuropsych/cognition: This patient is capable of making decisions on his own behalf. 6. Skin/Wound Care: Routine pressure relief measures.              -Bilateral lower extremity venous stasis ulcers.  Continue current wound care with Aquacel and Xeroform 7. Fluids/Electrolytes/Nutrition: Monitor I/O. Check CMET in am. Recheck Mg and Phos level in am8.    8. HTN: BP stable. Resume Prazosin when able  --Wide complex tachycardia: No significant arrhthymias seen per Dr. Tenny Craw.              --metoprolol 12.5 mg bid and can be increased to 25 mg bid.   -11/22 metoprolol was discontinued due to bradycardia  -11-23: Normotensive, mild intermittent bradycardia, appreciate cardiology assistance and medication titration  -11/24: Normotensive, changes as below  9. T2DM with peripheral neuropathy: Hgb A1C-6.5 and well controlled. Monitor BS ac/hs and use SSI for elevated BS.              --continue insulin glargine 40 units daily with novolog 12 units TID  -11/22 fair control, continue to monitor for now  07-01-23: Fair control, no changes this weekend  CBG (last 3)  Recent Labs    07/01/23 1635 07/01/23 2116 07/02/23 0555  GLUCAP 93 187* 177*    10. Chronic diastolic CHF: Added 1500 cc FR per recommendations. HH diet   --Daily weight and monitor for signs of overload--weight at admission 11/16- 373 lbs.  --evaluated by cards today-->BB increased to 25 mg BID, Demadex bid. Zaroxolyn X 1 dose --cards will follow for response in am.   11/22 cardiology following-appreciate assistance, cardiology holding off on spironolactone due to dizziness 11-23: No new weights overnight, no apparent fluid overload, continue current regimen 11/24: weight uptrending mildly from 11-22, no external signs of worsening volume overload, chest x-ray clear.  Per cardiology, could consider IV Lasix if additional diuresis needed, will defer at this time given medication changes below.  Filed Weights   06/29/23 1420 06/30/23 0500 07/02/23 0440  Weight: (!) 153.5 kg (!) 156.8 kg (!) 157.2 kg    11. Wide complex tachycardia/hx PAF/bradycardia: Started metoprolol 25mg  BID by cardiology. On Eliquis  -11/22 Zio started by cardiology, metoprolol was stopped.  Cardiology feels that pulse may be undercalled due to PVCs.  11-24: Per recommendation by EP, resumed metoprolol 12.5 mg twice daily despite ongoing bradycardia.  Nursing informed not to hold medication for heart rate.  Unfortunately, unable to interrogate Zio patch.  Appreciate cardiology input in this case.  12. Seizure d/o: Has been seizure free on Keppra and Vimpat -->followed by Dr. Teresa Coombs.  13. Major depressive: PTA--was on Effexor, Abilify, Remeron and Trazodone.              --lethargy could be due to daytime Abilify-->will change to nights             --Remeron and trazodone on hold per neuro  -Continue melatonin 14. GAD w/panic attacks/PTSD: Chronic issues onoging and followed by [facility]  --Was on Prazosin and Buspar.             --Mood stable on current dose Buspar. Monitor for now  15. Hypokalemia: Improved with supplementation.  -11/21 potassium lower today at 3.1, will change potassium supplement to 20 mEq twice daily.  Recheck tomorrow  -11/22 K+ up to 3.2, increase  to  in am and in evening  -11/22 potassium supplement increased to 40 mEq twice daily; repeat on Monday  16. Leucocytosis: WBC trending down-->monitor 17. OSA: Now compliant with CPAP but has been requiring 5 L oxygen per [place].              --question OHS as contributing factor leading to hypersomnia. Wean oxygen as able.   -Appears to be tolerating weaning down nasal O2, he reports he used his CPAP at night  07-01-23: Compliant with CPAP, stable on 2 L nasal cannula  18. COPD: On Trilegy at home -->on Breo/Incruse  Continue to wean off oxygen.   19. Neuropathy: Managed with spinal cord stimulator and Lyrica (Dr. Beverly Gust Shaw/[facility]).   -11-23: Patient complaining of worsening symptoms, especially at night.  However, appears already on max dose Lyrica and venlafaxine.  Defer further medication adjustments to primary team.   20. Gout flare R great toe: On colchicine bid for 5 more days, resume allopurinol in 2 to 3 weeks 21. CKD 3A:BUN/Scr-38/1.31 at admission.   -Creatinine a little higher today at 1.35/BUN 35 22. Morbid Obesity. Body mass index is 47.89 kg/m.             -Dietary counseling    LOS: 4 days A FACE TO FACE EVALUATION WAS PERFORMED  Angelina Sheriff 07/02/2023, 8:21 AM

## 2023-07-02 NOTE — Progress Notes (Signed)
Rounding Note    Patient Name: Lawrence [name] Date of Encounter: 07/02/2023   [facility] Cardiologist: Armanda Magic, MD / CHF service  Subjective   Pt in bed Breathing is fair     Inpatient Medications    Scheduled Meds:  apixaban  5 mg Oral BID   ARIPiprazole  5 mg Oral Q supper   busPIRone  10 mg Oral TID   cholecalciferol  2,000 Units Oral Daily   colchicine  0.3 mg Oral Daily   cyanocobalamin  1,000 mcg Oral Daily   fenofibrate  160 mg Oral Daily   ferrous sulfate  325 mg Oral Q breakfast   fluticasone furoate-vilanterol  1 puff Inhalation Daily   And   umeclidinium bromide  1 puff Inhalation Daily   icosapent Ethyl  2 g Oral BID   insulin aspart  0-5 Units Subcutaneous QHS   insulin aspart  0-9 Units Subcutaneous TID WC   insulin aspart  12 Units Subcutaneous TID WC   insulin glargine-yfgn  40 Units Subcutaneous Q24H   lacosamide  150 mg Oral BID   levETIRAcetam  500 mg Oral BID   metoprolol tartrate  12.5 mg Oral BID   pantoprazole  40 mg Oral Daily   potassium chloride  40 mEq Oral BID   pregabalin  150 mg Oral TID   rosuvastatin  20 mg Oral Q supper   torsemide  120 mg Oral BID   venlafaxine XR  225 mg Oral Q breakfast   Continuous Infusions:   PRN Meds: acetaminophen, alum & mag hydroxide-simeth, bisacodyl, diphenhydrAMINE, guaiFENesin-dextromethorphan, melatonin, methocarbamol, prochlorperazine **OR** prochlorperazine **OR** prochlorperazine, sodium phosphate   Vital Signs    Vitals:   07/01/23 1252 07/01/23 1913 07/02/23 0325 07/02/23 0440  BP: 122/65 113/61 129/86   Pulse: (!) 42 (!) 44 72   Resp: 18 18 18    Temp: 98.2 F (36.8 C) (!) 97.5 F (36.4 C) 97.6 F (36.4 C)   TempSrc: Oral  Oral   SpO2: 98% 93% 96%   Weight:    (!) 157.2 kg  Height:        Intake/Output Summary (Last 24 hours) at 07/02/2023 1024 Last data filed at 07/02/2023 1914 Gross per 24 hour  Intake 720 ml  Output 3500 ml  Net -2780 ml       07/02/2023    4:40 AM 06/30/2023    5:00 AM 06/29/2023    2:20 PM  Last 3 Weights  Weight (lbs) 346 lb 7.3 oz 345 lb 10.9 oz 338 lb 8 oz  Weight (kg) 157.15 kg 156.8 kg 153.543 kg      Telemetry    No tele    - Personally Reviewed  ECG    No new  - Personally Reviewed  Physical Exam  \ GEN: Morbidly obese 67 yo in no  acute distress.  Neck: Neck full  Cardiac: RRR, no murmurs, Respiratory: Clear to auscultation GI: Obese  Not tense   MS: Tr to 1+ edema;   Labs    High Sensitivity Troponin:  No results for input(s): "TROPONINIHS" in the last 720 hours.   Chemistry Recent Labs  Lab 06/27/23 0410 06/28/23 0551 06/29/23 0515 06/30/23 0646  NA 138 139 137 135  K 3.2* 3.6 3.1* 3.2*  CL 99 101 97* 93*  CO2 31 29 31 30   GLUCOSE 139* 91 115* 147*  BUN 23 23 25* 35*  CREATININE 1.37* 1.04 1.27* 1.35*  CALCIUM 8.4* 8.7* 8.8* 9.3  MG 2.5* 2.5* 2.3  --   PROT 6.2* 6.6 7.1  --   ALBUMIN 3.2* 3.4* 3.7  --   AST 18 16 18   --   ALT 17 16 19   --   ALKPHOS 42 44 47  --   BILITOT 0.4 0.7 0.7  --   GFRNONAA 57* >60 >60 58*  ANIONGAP 8 9 9 12     Lipids  No results for input(s): "CHOL", "TRIG", "HDL", "LABVLDL", "LDLCALC", "CHOLHDL" in the last 168 hours.   Hematology Recent Labs  Lab 06/27/23 0410 06/28/23 0551 06/29/23 0515  WBC 10.7* 10.4 11.7*  RBC 4.29 4.52 4.92  HGB 12.3* 12.8* 14.1  HCT 38.7* 40.1 42.8  MCV 90.2 88.7 87.0  MCH 28.7 28.3 28.7  MCHC 31.8 31.9 32.9  RDW 17.0* 16.4* 16.4*  PLT 264 291 312   Thyroid  No results for input(s): "TSH", "FREET4" in the last 168 hours.   BNPNo results for input(s): "BNP", "PROBNP" in the last 168 hours.  DDimer No results for input(s): "DDIMER" in the last 168 hours.   Radiology    No results found.  Cardiac Studies  1. Technically difficult study.   2. Left ventricular ejection fraction, by estimation, is 50 to 55%. The  left ventricle has low normal function. Left ventricular endocardial  border not  optimally defined to evaluate regional wall motion. The left  ventricular internal cavity size was  dilated. Left ventricular diastolic function could not be evaluated.   3. Right ventricular systolic function is normal. The right ventricular  size is normal. Mildly increased right ventricular wall thickness.   4. There is no evidence of cardiac tamponade.   5. The mitral valve is grossly normal. No evidence of mitral valve  regurgitation. No evidence of mitral stenosis.   6. The aortic valve was not well visualized. Aortic valve regurgitation  is not visualized. But based on doppler parameter cannot rule out mild  (per max velocity and MG) to moderate aortic stenosis (dimensional index).   7. Aortic ascending aorta is structurally normal, with no evidence of  dilitation.   8. Agitated saline contrast bubble study was inconclusive.   Conclusion(s)/Recommendation(s): No intracardiac source of embolism  detected on this transthoracic study. Consider a transesophageal  echocardiogram to exclude cardiac source of embolism if clinically  indicated.     Patient Profile      Lawrence [name] is a 67 y.o. male with a hx of paroxysmal atrial fibrillation, aortic stenosis status post TAVR, chronic HFpEF, PVCs, seizures, Hypertension, hyperlipidemia, type 2 diabetes, OSA uses BiPAP, who is being seen 06/26/2023 for the evaluation of wide-complex tachycardia at the request of Dr. Thedore Mins.   Assessment & Plan    1  Rhythm   Pt followed on inpatient service for SR and PVCs     Pulse noted to be slow intermittently -- likely PVCs   Resume betablocker Off telemetry - PVCs would be easier to manage on telemetry Zio monitor in place    2  HFpEF    Volume status is difficult given pt's body habitus But, he complains of SOB and he is still requiring O2 [place]   He was not on prior to hospitalization. IV diuretics today   For questions or updates, please contact [facility] Please consult  [email or web address] for contact info under        Signed, Maurice Small, MD  07/02/2023, 10:24 AM

## 2023-07-03 DIAGNOSIS — N179 Acute kidney failure, unspecified: Secondary | ICD-10-CM

## 2023-07-03 DIAGNOSIS — D72829 Elevated white blood cell count, unspecified: Secondary | ICD-10-CM | POA: Diagnosis not present

## 2023-07-03 DIAGNOSIS — I63542 Cerebral infarction due to unspecified occlusion or stenosis of left cerebellar artery: Secondary | ICD-10-CM | POA: Diagnosis not present

## 2023-07-03 DIAGNOSIS — G629 Polyneuropathy, unspecified: Secondary | ICD-10-CM

## 2023-07-03 DIAGNOSIS — I10 Essential (primary) hypertension: Secondary | ICD-10-CM | POA: Diagnosis not present

## 2023-07-03 DIAGNOSIS — Z952 Presence of prosthetic heart valve: Secondary | ICD-10-CM

## 2023-07-03 DIAGNOSIS — E1169 Type 2 diabetes mellitus with other specified complication: Secondary | ICD-10-CM

## 2023-07-03 DIAGNOSIS — I5033 Acute on chronic diastolic (congestive) heart failure: Secondary | ICD-10-CM | POA: Diagnosis not present

## 2023-07-03 DIAGNOSIS — I480 Paroxysmal atrial fibrillation: Secondary | ICD-10-CM | POA: Diagnosis not present

## 2023-07-03 DIAGNOSIS — I493 Ventricular premature depolarization: Secondary | ICD-10-CM

## 2023-07-03 DIAGNOSIS — E876 Hypokalemia: Secondary | ICD-10-CM | POA: Diagnosis not present

## 2023-07-03 DIAGNOSIS — I951 Orthostatic hypotension: Secondary | ICD-10-CM

## 2023-07-03 DIAGNOSIS — I959 Hypotension, unspecified: Secondary | ICD-10-CM

## 2023-07-03 LAB — BASIC METABOLIC PANEL
Anion gap: 13 (ref 5–15)
BUN: 32 mg/dL — ABNORMAL HIGH (ref 8–23)
CO2: 27 mmol/L (ref 22–32)
Calcium: 9 mg/dL (ref 8.9–10.3)
Chloride: 94 mmol/L — ABNORMAL LOW (ref 98–111)
Creatinine, Ser: 1.41 mg/dL — ABNORMAL HIGH (ref 0.61–1.24)
GFR, Estimated: 55 mL/min — ABNORMAL LOW (ref >60)
Glucose, Bld: 132 mg/dL — ABNORMAL HIGH (ref 70–99)
Potassium: 3.4 mmol/L — ABNORMAL LOW (ref 3.5–5.1)
Sodium: 134 mmol/L — ABNORMAL LOW (ref 135–145)

## 2023-07-03 LAB — CBC
HCT: 43.8 % (ref 39.0–52.0)
Hemoglobin: 14.4 g/dL (ref 13.0–17.0)
MCH: 29 pg (ref 26.0–34.0)
MCHC: 32.9 g/dL (ref 30.0–36.0)
MCV: 88.1 fL (ref 80.0–100.0)
Platelets: 309 10*3/uL (ref 150–400)
RBC: 4.97 MIL/uL (ref 4.22–5.81)
RDW: 15.9 % — ABNORMAL HIGH (ref 11.5–15.5)
WBC: 9.6 10*3/uL (ref 4.0–10.5)
nRBC: 0 % (ref 0.0–0.2)

## 2023-07-03 LAB — GLUCOSE, CAPILLARY
Glucose-Capillary: 152 mg/dL — ABNORMAL HIGH (ref 70–99)
Glucose-Capillary: 145 mg/dL — ABNORMAL HIGH (ref 70–99)
Glucose-Capillary: 115 mg/dL — ABNORMAL HIGH (ref 70–99)
Glucose-Capillary: 108 mg/dL — ABNORMAL HIGH (ref 70–99)

## 2023-07-03 MED ORDER — METOPROLOL SUCCINATE ER 25 MG PO TB24
25.0000 mg | ORAL_TABLET | Freq: Every day | ORAL | Status: DC
  Administered 2023-07-04 – 2023-07-12 (×9): 25 mg via ORAL
  Filled 2023-07-03 (×9): qty 1

## 2023-07-03 MED ORDER — POTASSIUM CHLORIDE CRYS ER 20 MEQ PO TBCR
40.0000 meq | EXTENDED_RELEASE_TABLET | Freq: Once | ORAL | Status: AC
  Administered 2023-07-03: 40 meq via ORAL
  Filled 2023-07-03: qty 2

## 2023-07-03 MED ORDER — ROSUVASTATIN CALCIUM 20 MG PO TABS
40.0000 mg | ORAL_TABLET | Freq: Every day | ORAL | Status: DC
  Administered 2023-07-03 – 2023-07-11 (×9): 40 mg via ORAL
  Filled 2023-07-03 (×9): qty 2

## 2023-07-03 MED ORDER — METOPROLOL TARTRATE 12.5 MG HALF TABLET
12.5000 mg | ORAL_TABLET | Freq: Two times a day (BID) | ORAL | Status: AC
  Administered 2023-07-03: 12.5 mg via ORAL
  Filled 2023-07-03: qty 1

## 2023-07-03 MED ORDER — COLCHICINE 0.3 MG HALF TABLET
0.3000 mg | ORAL_TABLET | Freq: Every day | ORAL | Status: AC
  Administered 2023-07-04: 0.3 mg via ORAL
  Filled 2023-07-03: qty 1

## 2023-07-03 MED ORDER — POTASSIUM CHLORIDE CRYS ER 20 MEQ PO TBCR
40.0000 meq | EXTENDED_RELEASE_TABLET | Freq: Two times a day (BID) | ORAL | Status: AC
  Administered 2023-07-03: 40 meq via ORAL
  Filled 2023-07-03: qty 2

## 2023-07-03 MED ORDER — POTASSIUM CHLORIDE CRYS ER 20 MEQ PO TBCR
40.0000 meq | EXTENDED_RELEASE_TABLET | Freq: Three times a day (TID) | ORAL | Status: DC
  Administered 2023-07-04 – 2023-07-12 (×26): 40 meq via ORAL
  Filled 2023-07-03 (×26): qty 2

## 2023-07-03 NOTE — Progress Notes (Signed)
Rounding Note    Patient Name: Lawrence [name] Date of Encounter: 07/03/2023  [facility] Cardiologist: Armanda Magic, MD / CHF service  Subjective   Pt working w/ PT, unable to get on standing scale, can stand w/ more elevated seat and a walker - per family, dry wt 346 lbs, today is 349  Inpatient Medications    Scheduled Meds:  apixaban  5 mg Oral BID   ARIPiprazole  5 mg Oral Q supper   busPIRone  10 mg Oral TID   cholecalciferol  2,000 Units Oral Daily   colchicine  0.3 mg Oral Daily   cyanocobalamin  1,000 mcg Oral Daily   fenofibrate  160 mg Oral Daily   ferrous sulfate  325 mg Oral Q breakfast   fluticasone furoate-vilanterol  1 puff Inhalation Daily   And   umeclidinium bromide  1 puff Inhalation Daily   icosapent Ethyl  2 g Oral BID   insulin aspart  0-5 Units Subcutaneous QHS   insulin aspart  0-9 Units Subcutaneous TID WC   insulin aspart  12 Units Subcutaneous TID WC   insulin glargine-yfgn  40 Units Subcutaneous Q24H   lacosamide  150 mg Oral BID   levETIRAcetam  500 mg Oral BID   metoprolol tartrate  12.5 mg Oral BID   pantoprazole  40 mg Oral Daily   potassium chloride  40 mEq Oral BID   pregabalin  150 mg Oral TID   rosuvastatin  20 mg Oral Q supper   torsemide  120 mg Oral BID   venlafaxine XR  225 mg Oral Q breakfast   Continuous Infusions:   PRN Meds: acetaminophen, alum & mag hydroxide-simeth, bisacodyl, diphenhydrAMINE, guaiFENesin-dextromethorphan, melatonin, methocarbamol, prochlorperazine **OR** prochlorperazine **OR** prochlorperazine, sodium phosphate   Vital Signs    Vitals:   07/02/23 2000 07/03/23 0441 07/03/23 0500 07/03/23 0756  BP: (!) 143/77 (!) 129/95  (!) 99/53  Pulse: (!) 43 74  72  Resp: 18 18    Temp: (!) 97.5 F (36.4 C) (!) 97.4 F (36.3 C)    TempSrc: Oral Oral    SpO2: 96% 97%    Weight:   (!) 158.3 kg   Height:        Intake/Output Summary (Last 24 hours) at 07/03/2023 0939 Last data filed at  07/03/2023 0737 Gross per 24 hour  Intake 1077 ml  Output 1500 ml  Net -423 ml      07/03/2023    5:00 AM 07/02/2023    4:40 AM 06/30/2023    5:00 AM  Last 3 Weights  Weight (lbs) 349 lb 0.5 oz 346 lb 7.3 oz 345 lb 10.9 oz  Weight (kg) 158.32 kg 157.15 kg 156.8 kg      Telemetry    No tele    - Personally Reviewed  ECG    No new  - Personally Reviewed  Physical Exam   GEN: Morbidly obese 67 yo in no  acute distress at rest on O2.  Neck: Neck full, not able to see JVD Cardiac: RRR, no murmurs, Respiratory: Clear to auscultation GI: Obese  Not tense   MS: legs wrapped and not disturbed   Labs    High Sensitivity Troponin:  No results for input(s): "TROPONINIHS" in the last 720 hours.   Chemistry Recent Labs  Lab 06/27/23 0410 06/28/23 0551 06/29/23 0515 06/30/23 0646 07/03/23 0614  NA 138 139 137 135 134*  K 3.2* 3.6 3.1* 3.2* 3.4*  CL 99 101  97* 93* 94*  CO2 31 29 31 30 27   GLUCOSE 139* 91 115* 147* 132*  BUN 23 23 25* 35* 32*  CREATININE 1.37* 1.04 1.27* 1.35* 1.41*  CALCIUM 8.4* 8.7* 8.8* 9.3 9.0  MG 2.5* 2.5* 2.3  --   --   PROT 6.2* 6.6 7.1  --   --   ALBUMIN 3.2* 3.4* 3.7  --   --   AST 18 16 18   --   --   ALT 17 16 19   --   --   ALKPHOS 42 44 47  --   --   BILITOT 0.4 0.7 0.7  --   --   GFRNONAA 57* >60 >60 58* 55*  ANIONGAP 8 9 9 12 13     Lipids  No results for input(s): "CHOL", "TRIG", "HDL", "LABVLDL", "LDLCALC", "CHOLHDL" in the last 168 hours.   Hematology Recent Labs  Lab 06/28/23 0551 06/29/23 0515 07/03/23 0614  WBC 10.4 11.7* 9.6  RBC 4.52 4.92 4.97  HGB 12.8* 14.1 14.4  HCT 40.1 42.8 43.8  MCV 88.7 87.0 88.1  MCH 28.3 28.7 29.0  MCHC 31.9 32.9 32.9  RDW 16.4* 16.4* 15.9*  PLT 291 312 309   Thyroid  No results for input(s): "TSH", "FREET4" in the last 168 hours.   BNPNo results for input(s): "BNP", "PROBNP" in the last 168 hours.  DDimer No results for input(s): "DDIMER" in the last 168 hours.   Radiology    DG  CHEST PORT 1 VIEW  Result Date: 07/02/2023 CLINICAL DATA:  [id number] SOB (shortness of breath) [id number] EXAM: PORTABLE CHEST 1 VIEW COMPARISON:  June 29, 2023 FINDINGS: The cardiomediastinal silhouette is unchanged and enlarged in contour. No pleural effusion. No pneumothorax. No acute pleuroparenchymal abnormality. IMPRESSION: No acute cardiopulmonary abnormality. Electronically Signed   By: Meda Klinefelter M.D.   On: 07/02/2023 16:23    Cardiac Studies   ECHO: 06/26/2023 1. Technically difficult study.   2. Left ventricular ejection fraction, by estimation, is 50 to 55%. The left ventricle has low normal function. Left ventricular endocardial border not optimally defined to evaluate regional wall motion. The left ventricular internal cavity size was  dilated. Left ventricular diastolic function could not be evaluated.   3. Right ventricular systolic function is normal. The right ventricular size is normal. Mildly increased right ventricular wall thickness.   4. There is no evidence of cardiac tamponade.   5. The mitral valve is grossly normal. No evidence of mitral valve regurgitation. No evidence of mitral stenosis.   6. The aortic valve was not well visualized. Aortic valve regurgitation is not visualized. But based on doppler parameter cannot rule out mild (per max velocity and MG) to moderate aortic stenosis (dimensional index).   7. Aortic ascending aorta is structurally normal, with no evidence of dilitation.   8. Agitated saline contrast bubble study was inconclusive.   Conclusion(s)/Recommendation(s): No intracardiac source of embolism detected on this transthoracic study. Consider a transesophageal echocardiogram to exclude cardiac source of embolism if clinically indicated.     Patient Profile      Lawrence [name] is a 67 y.o. male with a hx of paroxysmal atrial fibrillation, aortic stenosis status post TAVR, chronic HFpEF, PVCs, seizures, Hypertension, hyperlipidemia, type 2  diabetes, OSA uses BiPAP, who is being seen 06/26/2023 for the evaluation of wide-complex tachycardia at the request of Dr. Thedore Mins.   Assessment & Plan    1  Rhythm   Pt followed on inpatient service for SR  and PVCs     Pulse noted to be slow intermittently -- likely PVCs   On metoprolol 12.5 mg bid Off telemetry - PVCs would be easier to manage on telemetry Zio monitor in place   - have requested download  2  HFpEF    - Volume status is difficult given pt's body habitus - But, he complains of SOB and he is still requiring 3 lpm O2.  -   He was not on prior to hospitalization. - IV diuretics mentioned in 11/24 note, not given - BUN somewhat elevated, Cr trending up - on Torsemide 120 mg bid - K+ 3.4 on standing Kdur 40 meq bid >> give extra 40 meq today - med changes per IM   For questions or updates, please contact [facility] Please consult [email or web address] for contact info under        Signed, Theodore Demark, PA-C  07/03/2023, 9:39 AM

## 2023-07-03 NOTE — Plan of Care (Signed)
  Problem: Consults Goal: RH STROKE PATIENT EDUCATION Description: See Patient Education module for education specifics  Outcome: Progressing   Problem: RH BOWEL ELIMINATION Goal: RH STG MANAGE BOWEL WITH ASSISTANCE Description: STG Manage Bowel with min Assistance. Outcome: Progressing Goal: RH STG MANAGE BOWEL W/MEDICATION W/ASSISTANCE Description: STG Manage Bowel with Medication with min Assistance. Outcome: Progressing   Problem: RH BLADDER ELIMINATION Goal: RH STG MANAGE BLADDER WITH ASSISTANCE Description: STG Manage Bladder With min Assistance Outcome: Progressing   Problem: RH SKIN INTEGRITY Goal: RH STG SKIN FREE OF INFECTION/BREAKDOWN Description: Skin will remain intact and free of infection/breakdown with min assist  Outcome: Progressing Goal: RH STG MAINTAIN SKIN INTEGRITY WITH ASSISTANCE Description: STG Maintain Skin Integrity With min Assistance. Outcome: Progressing   Problem: RH SAFETY Goal: RH STG ADHERE TO SAFETY PRECAUTIONS W/ASSISTANCE/DEVICE Description: STG Adhere to Safety Precautions With min Assistance/Device. Outcome: Progressing Goal: RH STG DECREASED RISK OF FALL WITH ASSISTANCE Description: STG Decreased Risk of Fall With min Assistance. Outcome: Progressing   Problem: RH PAIN MANAGEMENT Goal: RH STG PAIN MANAGED AT OR BELOW PT'S PAIN GOAL Description: Less than 4 with PRN medications min assist  Outcome: Progressing   Problem: RH KNOWLEDGE DEFICIT Goal: RH STG INCREASE KNOWLEDGE OF HYPERTENSION Description: Patient/caregiver will be able to manage cardiac medications and self care from nursing education, nursing handouts, and other resources independently  Outcome: Progressing Goal: RH STG INCREASE KNOWLEGDE OF HYPERLIPIDEMIA Description: Patient/caregiver will be able to manage cholesterol medications and diet/lifestyle modifications to improve HDL of 30 and trig of 209 from nursing education and nursing handouts independently  Outcome:  Progressing Goal: RH STG INCREASE KNOWLEDGE OF STROKE PROPHYLAXIS Description: Patient/caregiver will be able to manage Eliquis, self care, and other medications to reduce the risk of stroke from nursing educations, nursing handouts, and other resources independently  Outcome: Progressing   Problem: Education: Goal: Ability to describe self-care measures that may prevent or decrease complications (Diabetes Survival Skills Education) will improve Outcome: Progressing Goal: Individualized Educational Video(s) Outcome: Progressing   Problem: Coping: Goal: Ability to adjust to condition or change in health will improve Outcome: Progressing   Problem: Fluid Volume: Goal: Ability to maintain a balanced intake and output will improve Outcome: Progressing   Problem: Health Behavior/Discharge Planning: Goal: Ability to identify and utilize available resources and services will improve Outcome: Progressing Goal: Ability to manage health-related needs will improve Outcome: Progressing   Problem: Metabolic: Goal: Ability to maintain appropriate glucose levels will improve Outcome: Progressing   Problem: Nutritional: Goal: Maintenance of adequate nutrition will improve Outcome: Progressing Goal: Progress toward achieving an optimal weight will improve Outcome: Progressing

## 2023-07-03 NOTE — Progress Notes (Signed)
Pt. Has home cpap. Pt. States he's able to operate himself.

## 2023-07-03 NOTE — Progress Notes (Addendum)
PROGRESS NOTE   Subjective/Complaints: No acute events overnight.  He asked about his occasional low heart rate.  Currently wearing Zio patch.  Doing well with therapy.  No additional concerns.  ROS: Patient denies fever, chills, malaise, rash, sore throat, blurred vision, nausea, vomiting, diarrhea, cough or chest pain, headache, or mood change. + dizzy when standing-improved +SOB-requiring intermittent 2 L nasal cannula-unchanged +Chest pain-resolved   + Peripheral neuropathy-stable  Objective:   DG CHEST PORT 1 VIEW  Result Date: 07/02/2023 CLINICAL DATA:  141880 SOB (shortness of breath) 141880 EXAM: PORTABLE CHEST 1 VIEW COMPARISON:  June 29, 2023 FINDINGS: The cardiomediastinal silhouette is unchanged and enlarged in contour. No pleural effusion. No pneumothorax. No acute pleuroparenchymal abnormality. IMPRESSION: No acute cardiopulmonary abnormality. Electronically Signed   By: Meda Klinefelter M.D.   On: 07/02/2023 16:23   Recent Labs    07/03/23 0614  WBC 9.6  HGB 14.4  HCT 43.8  PLT 309    Recent Labs    07/03/23 0614  NA 134*  K 3.4*  CL 94*  CO2 27  GLUCOSE 132*  BUN 32*  CREATININE 1.41*  CALCIUM 9.0    Intake/Output Summary (Last 24 hours) at 07/03/2023 0804 Last data filed at 07/03/2023 0737 Gross per 24 hour  Intake 1317 ml  Output 1800 ml  Net -483 ml        Physical Exam: Vital Signs Blood pressure (!) 99/53, pulse 72, temperature (!) 97.4 F (36.3 C), temperature source Oral, resp. rate 18, height 6\' 2"  (1.88 m), weight (!) 158.3 kg, SpO2 97%.     General: , No apparent distress, morbidly obese.  Sitting in wheelchair working with therapy HEENT: Head is normocephalic, atraumatic,MMM neck: Supple.  No JVD. Heart: Reg rate and rhythm.  No murmurs, rubs, or gallops.  Noted Chest: CTA bilaterally without wheezes, rales, or rhonchi; nonlabored breathing.  On 2 L nasal cannula.   Zio patch in place Abdomen: Soft, mildly-distended, nontender, bowel sounds positive. Extremities: No clubbing, cyanosis, trace bilateral lower extremity edema-unchanged from prior exam.  Bilateral lower extremity wraps. Psych: Appropriate mood and affect, cooperative Skin: Dry dressing in place to  b/l ankles, no apparent breakdown.   Neuro:  Mental Status: AAOx3, memory intact, fund of knowledge appropriate.  No apparent deficits. Cranial nerves: positive left trigeminal sensory deficit.   MOTOR: Moving all 4 extremities antigravity and against resistance.  From prior exams: RUE: 5/5 Deltoid, 5/5 Biceps, 5/5 Triceps,5/5 Grip LUE: 4/5 Deltoid, 4/5 Biceps, 4/5 Triceps, 4/5 Grip RLE: HF 5/5, KE 5/5, ADF 5/5, APF 5/5 LLE: HF 1-2/5, KE 1/5, ADF 2/5, APF 2/5     No hypertonia noted    SENSORY: Altered sensation to light touch in left upper extremity and left lower extremity, as well as bilateral feet and all toes   Assessment/Plan: 1. Functional deficits which require 3+ hours per day of interdisciplinary therapy in a comprehensive inpatient rehab setting. Physiatrist is providing close team supervision and 24 hour management of active medical problems listed below. Physiatrist and rehab team continue to assess barriers to discharge/monitor patient progress toward functional and medical goals  Care Tool:  Bathing    Body parts  bathed by patient: Right arm, Left arm, Chest, Abdomen, Front perineal area, Face   Body parts bathed by helper: Right upper leg, Left upper leg, Right lower leg, Left lower leg     Bathing assist Assist Level: Maximal Assistance - Patient 24 - 49%     Upper Body Dressing/Undressing Upper body dressing   What is the patient wearing?: Pull over shirt    Upper body assist Assist Level: Minimal Assistance - Patient > 75%    Lower Body Dressing/Undressing Lower body dressing      What is the patient wearing?: Pants     Lower body assist Assist  for lower body dressing: Maximal Assistance - Patient 25 - 49%     Toileting Toileting    Toileting assist Assist for toileting: Maximal Assistance - Patient 25 - 49%     Transfers Chair/bed transfer  Transfers assist     Chair/bed transfer assist level: Minimal Assistance - Patient > 75%     Locomotion Ambulation   Ambulation assist   Ambulation activity did not occur: Safety/medical concerns  Assist level: Minimal Assistance - Patient > 75% Assistive device: Walker-rolling Max distance: 45   Walk 10 feet activity   Assist  Walk 10 feet activity did not occur: Safety/medical concerns  Assist level: Minimal Assistance - Patient > 75% Assistive device: Walker-rolling   Walk 50 feet activity   Assist Walk 50 feet with 2 turns activity did not occur: Safety/medical concerns         Walk 150 feet activity   Assist Walk 150 feet activity did not occur: Safety/medical concerns         Walk 10 feet on uneven surface  activity   Assist Walk 10 feet on uneven surfaces activity did not occur: Safety/medical concerns         Wheelchair     Assist Is the patient using a wheelchair?: Yes Type of Wheelchair: Manual    Wheelchair assist level: Dependent - Patient 0%      Wheelchair 50 feet with 2 turns activity    Assist        Assist Level: Dependent - Patient 0%   Wheelchair 150 feet activity     Assist      Assist Level: Dependent - Patient 0%   Blood pressure (!) 99/53, pulse 72, temperature (!) 97.4 F (36.3 C), temperature source Oral, resp. rate 18, height 6\' 2"  (1.88 m), weight (!) 158.3 kg, SpO2 97%.  Medical Problem List and Plan: 1. Functional deficits secondary to acute ischemic left cerebellar infarct with hx of prior CVA             -patient may shower, please cover RLE skin breakdown             -ELOS/Goals: 16 to 20 days, PT OT supervision to min assist, SLP supervision MRI             -Continue CIR  2.   Antithrombotics: -DVT/anticoagulation:  Pharmaceutical: Eliquis             -antiplatelet therapy: N/A 3. Chronic pain/Pain Management: Has spinal cord stimulator. Continue Tylenol prn.   --will d/c hydrocodone and monitor as denies pain at this time.  --continue robaxin prn  4. Mood/Behavior/Sleep: LCSW to follow for evaluation and support.              -antipsychotic agents: Abilify-->will change to bedtime as at home.              --  chronic insomnia was being managed with Trazodone and Remeron--> d/c per neurology --has PTSD/Nightmares but sleeping well in hospital. Resume Prasozin 2 mg/HS if BP stable.  5. Neuropsych/cognition: This patient is capable of making decisions on his own behalf. 6. Skin/Wound Care: Routine pressure relief measures.              -Bilateral lower extremity venous stasis ulcers.  Continue current wound care with Aquacel and Xeroform 7. Fluids/Electrolytes/Nutrition: Monitor I/O. Check CMET in am. Recheck Mg and Phos level in am8.    8. HTN: BP stable. Resume Prazosin when able  --Wide complex tachycardia: No significant arrhthymias seen per Dr. Tenny Craw.              --metoprolol 12.5 mg bid and can be increased to 25 mg bid.   -11/22 metoprolol was discontinued due to bradycardia  -11-23: Normotensive, mild intermittent bradycardia, appreciate cardiology assistance and medication titration  -11/25 BP soft but overall stable today, continue to monitor     07/03/2023    7:56 AM 07/03/2023    5:00 AM 07/03/2023    4:41 AM  Vitals with BMI  Weight  349 lbs 1 oz   BMI  44.79   Systolic 99  129  Diastolic 53  95  Pulse 72  74     9. T2DM with peripheral neuropathy: Hgb A1C-6.5 and well controlled. Monitor BS ac/hs and use SSI for elevated BS.              --continue insulin glargine 40 units daily with novolog 12 units TID  -11/22 fair control, continue to monitor for now  11-25 well-controlled overall  CBG (last 3)  Recent Labs    07/02/23 1632  07/02/23 2059 07/03/23 0539  GLUCAP 112* 121* 152*    10. Chronic diastolic CHF: Added 1500 cc FR per recommendations. HH diet  --Daily weight and monitor for signs of overload--weight at admission 11/16- 373 lbs.  --evaluated by cards today-->BB increased to 25 mg BID, Demadex bid. Zaroxolyn X 1 dose --cards will follow for response in am.   11/22 cardiology following-appreciate assistance, cardiology holding off on spironolactone due to dizziness 11-23: No new weights overnight, no apparent fluid overload, continue current regimen 11/24: weight uptrending mildly from 11-22, no external signs of worsening volume overload, chest x-ray clear.  Per cardiology, could consider IV Lasix if additional diuresis needed, will defer at this time given medication changes below.  Filed Weights   06/30/23 0500 07/02/23 0440 07/03/23 0500  Weight: (!) 156.8 kg (!) 157.2 kg (!) 158.3 kg    11. Wide complex tachycardia/hx PAF/bradycardia/PVCs: Started metoprolol 25mg  BID by cardiology. On Eliquis  -11/22 Zio started by cardiology, metoprolol was stopped.  Cardiology feels that pulse may be undercalled due to PVCs.  11-24: Per recommendation by EP, resumed metoprolol 12.5 mg twice daily despite ongoing bradycardia.  Nursing informed not to hold medication for heart rate.  Unfortunately, unable to interrogate Zio patch.  Appreciate cardiology input in this case.  -11/25 cardiology has seen him today, will monitor for recommendations, zio patch in place  12. Seizure d/o: Has been seizure free on Keppra and Vimpat -->followed by Dr. Teresa Coombs.  13. Major depressive: PTA--was on Effexor, Abilify, Remeron and Trazodone.              --lethargy could be due to daytime Abilify-->will change to nights             --Remeron and trazodone on hold per neuro  -  Continue melatonin 14. GAD w/panic attacks/PTSD: Chronic issues onoging and followed by [facility].  --Was on Prazosin and Buspar.             --Mood stable on current  dose Buspar. Monitor for now  15. Hypokalemia: Improved with supplementation.  -11/21 potassium lower today at 3.1, will change potassium supplement to 20 mEq twice daily.  Recheck tomorrow  -11/22 K+ up to 3.2, increase to in am and in evening  -11/22 potassium supplement increased to 40 mEq twice daily; repeat on Monday  -11/25 potassium up to 3.4 continue supplementation 16. Leucocytosis: WBC trending down-->monitor  -11/25 within normal limits 17. OSA: Now compliant with CPAP but has been requiring 5 L oxygen per [place].              --question OHS as contributing factor leading to hypersomnia. Wean oxygen as able.   -Appears to be tolerating weaning down nasal O2, he reports he used his CPAP at night  07-01-23: Compliant with CPAP, stable on 2 L nasal cannula  18. COPD: On Trilegy at home -->on Breo/Incruse  Continue to wean off oxygen.   19. Neuropathy: Managed with spinal cord stimulator and Lyrica (Dr. Beverly Gust Shaw/[facility]).   -11-23: Patient complaining of worsening symptoms, especially at night.  However, appears already on max dose Lyrica and venlafaxine.  Defer further medication adjustments to primary team.   20. Gout flare R great toe: On colchicine bid for 5 more days, resume allopurinol in 2 to 3 weeks  -11/25 colchicine was decreased to daily, discontinue after 1 more day 21. CKD 3A:BUN/Scr-38/1.31 at admission.   -Creatinine a little higher today at 1.41/BUN 32, patient is on diuretics per cardiology 22. Morbid Obesity. Body mass index is 47.89 kg/m.             -Dietary counseling    LOS: 5 days A FACE TO FACE EVALUATION WAS PERFORMED  Fanny Dance 07/03/2023, 8:04 AM

## 2023-07-03 NOTE — Plan of Care (Signed)
  Problem: RH Balance Goal: LTG Patient will maintain dynamic standing balance (PT) Description: LTG:  Patient will maintain dynamic standing balance with assistance during mobility activities (PT) Flowsheets (Taken 07/03/2023 0731) LTG: Pt will maintain dynamic standing balance during mobility activities with:: Contact Guard/Touching assist   Problem: Sit to Stand Goal: LTG:  Patient will perform sit to stand with assistance level (PT) Description: LTG:  Patient will perform sit to stand with assistance level (PT) Flowsheets (Taken 07/03/2023 0731) LTG: PT will perform sit to stand in preparation for functional mobility with assistance level: Contact Guard/Touching assist   Problem: RH Bed to Chair Transfers Goal: LTG Patient will perform bed/chair transfers w/assist (PT) Description: LTG: Patient will perform bed to chair transfers with assistance (PT). Flowsheets (Taken 07/03/2023 0731) LTG: Pt will perform Bed to Chair Transfers with assistance level: Contact Guard/Touching assist   Problem: RH Furniture Transfers Goal: LTG Patient will perform furniture transfers w/assist (OT/PT) Description: LTG: Patient will perform furniture transfers  with assistance (OT/PT). Flowsheets (Taken 07/03/2023 0731) LTG: Pt will perform furniture transfers with assist:: Contact Guard/Touching assist   Problem: RH Ambulation Goal: LTG Patient will ambulate in controlled environment (PT) Description: LTG: Patient will ambulate in a controlled environment, # of feet with assistance (PT). Flowsheets (Taken 07/03/2023 0731) LTG: Pt will ambulate in controlled environ  assist needed:: Contact Guard/Touching assist Goal: LTG Patient will ambulate in home environment (PT) Description: LTG: Patient will ambulate in home environment, # of feet with assistance (PT). Flowsheets (Taken 07/03/2023 0731) LTG: Pt will ambulate in home environ  assist needed:: Contact Guard/Touching assist

## 2023-07-03 NOTE — Progress Notes (Signed)
Physical Therapy Session Note  Patient Details  Name: Lawrence [name] MRN: [id number] Date of Birth: 1955/09/04  Today's Date: 07/03/2023 PT Individual Time: 8469-6295, 0915-1004 PT Individual Time Calculation (min): 79 min, 49 min   Short Term Goals: Week 1:  PT Short Term Goal 1 (Week 1): pt will perform sit to stand with LRAD and min A PT Short Term Goal 2 (Week 1): Pt will perform bed to chair transfer with LRAD and min A PT Short Term Goal 3 (Week 1): pt will ambulate 20 feet with LRAD and mod A or less  Skilled Therapeutic Interventions/Progress Updates:      Treatment Session 1   Pt seated in WC upon arrival.Pt agreeable to therapy. Pt reports 4/10 neuropathic pain in R foot, premedicated.   Pt on 3L O2 95, HR 67 -pt denies any lightheadedness/dizziness at rest, 3L O2 98, HR 70 after wheelchair propulsion, 3L O2 98, HR 74 after first gait trial, 3L O2 98, HR 71 after 2nd gait trial.   Pt self propelled WC 80 feet with B UE, with supervision, verbal cues provided for technique for energy conservation.   Pt ambulated 1x28, 1x20 feet with RW and CGA, with WC in tow and therapist managing oxygen tank, verbal and tactile cues provided for maintenance of slight knee bend in R knee as pt demos significant R knee hyerextension with R LE stance. Pt required seated rest break 2/2 fatigue.   Pt performed sit to stand x2 with heavy mod A from [facility], verbal cues provided for technique.   Pt performed sit to stand x 43from elevated mat table with RW and light mod A, verbal cues provided for technique.   Rhonda PA present to do cardiac assessment. Attempted to get pt on standing scale per PA request. Pt requiring max A, despite change in positioning, and elevating mat table, pt unable to reach full standing,   Pt performed stand pivot transfer with +2 A for safety, with min A for power up from elevated mat table and CGA for pivot, verbal cues provided for positioning and technique  Discussed  pt home set up with wife, pt wife reports she doesn't feel a wheelchair will fit throughout the house. Therapist provided pt with home measurement sheet.   Discussed pt R knee hyperextension, pt reports he has been doing it since first stroke. Pt reports he has a knee brace at home but pt doesn't feel it helps. Therapist requested wife to bring brace. Pt wife plans to bring tomorrow.   Pt seated in WC at end of session with all needs within reach and bed alarm on wit wife in room.    Treatment Session 2   Pt seated in WC upon arrival. Pt agreeable to therapy. Pt reports 6/10 R foot pain, nurse present to administer pain medicine during session, therapist provided rest and repositioning as needed.   Pt 3L O2 94, HR 64 at rest, decreased oxygen saturation to 2L to assess pt tolerance. 2L O2 94, HR 78 at rest. Pt 2L O2 remained 92 and above throughout session.   Pt self propelled WC from room to main gym with B UE and supervision, intermittent verbal cues provided for technique for energy conservation.   Pt performed sit to stand x5 from elevated mat table, with min-max A for 1 episode of L LE buckling 2/2 fatigue, pt requires prolonged rest break between trails 2/2 fatigue, therapist providing approximation to L LE.   Pt performed 1x10 ankle pumps  B, 1x10 LAQ B (lacking ~15 deg on L LE, 1x15 glute sets holding for 5 seconds, seated alternating marching x10 B.   Pt attempted sit to stand from [facility] with +1 A, however returned to sitting 2/2 safety. Pt performed with +1 min A, with +2 for safety with approximation to B LE with min A.   Pt ambulated 20 feet with RW and CGA, verbal cues provided for upright posture and forward gaze.   Pt requesting to use bathroom at end of session. Pt performed stand pivot transfer WC to BSC with +2 mod A 2/2 fatigue. Pt with tech on BSC at end of session, recommended tech use steady and  +2 for safety 2/2 fluctuating level of assist required with fatigue.     Therapy Documentation Precautions:  Precautions Precautions: Fall Precaution Comments: L hemi, Restrictions Weight Bearing Restrictions: No  Therapy/Group: Individual Therapy  [facility] Ambrose [place], [place], DPT  07/03/2023, 7:39 AM

## 2023-07-03 NOTE — Progress Notes (Signed)
Occupational Therapy Session Note  Patient Details  Name: Lawrence [name] MRN: [id number] Date of Birth: [birth year]  Today's Date: 07/03/2023 OT Individual Time: 4403-4742 OT Individual Time Calculation (min): 44 min    Short Term Goals: Week 1:  OT Short Term Goal 1 (Week 1): Pt will perform sit to stand with min A for ADL tasks OT Short Term Goal 2 (Week 1): Pt will stand for 1 min with min A sink side during ADL OT Short Term Goal 3 (Week 1): Pt will access bathroom toilet with commode support with min A SPT  Skilled Therapeutic Interventions/Progress Updates:   Pt seen for skilled OT am session. Sponge bath due to Zio monitor still in place and pt reports MD relayed it may be d/c'd today for potential shower tomorrow. Pt already toileted and reports feeling stronger overall and much less to no B LE pain and restlessness. Transferred to EOB with close S, sit to stand from regular surface of bed with CGA, SPT to w/c with RW with CGA. Sink side sponge bathing seated and standing with S for UB bathing and dressing and mod A overall LE bathing and dressing due to decreased functional reach to buttocks and LE's. Training with energy conservation and breathing as well as use of AE for shorts reach over feet. TEVA sandals applied by OT. Stood 1 min max with need for seated rest during oral care component of grooming. Seated shaving with set up only. O2 2 ltrs in place with no episodes of dizziness reported. Left with hand off to nursing to complete sink side needs.   Pain: significant decreased LE pain this session, reports "very low" in B LE's with pain meds given earlier   Therapy Documentation Precautions:  Precautions Precautions: Fall Precaution Comments: L hemi, Restrictions Weight Bearing Restrictions: No   Therapy/Group: Individual Therapy  Vicenta Dunning 07/03/2023, 7:41 AM

## 2023-07-03 NOTE — Progress Notes (Signed)
Occupational Therapy Session Note  Patient Details  Name: Lawrence [name] MRN: [id number] Date of Birth: [birth year]  Today's Date: 07/03/2023 OT Individual Time: 1120-1202 OT Individual Time Calculation (min): 42 min    Short Term Goals: Week 1:  OT Short Term Goal 1 (Week 1): Pt will perform sit to stand with min A for ADL tasks OT Short Term Goal 2 (Week 1): Pt will stand for 1 min with min A sink side during ADL OT Short Term Goal 3 (Week 1): Pt will access bathroom toilet with commode support with min A SPT  Skilled Therapeutic Interventions/Progress Updates:   Pt greeted sitting in [facility] for skilled OT session with focus on standing tolerance/balance.   Pain: Pt reported 8/10 pain in R-knee during activity, MD messaged about Volteran gel for topical pain releif. OT offering intermediate rest breaks and positioning suggestions throughout session to address pain/fatigue and maximize participation/safety in session.   Functional Transfers: Pt self propels from room<>day room with increased time and effort for UE conditioning. Pt performs multiple STS transfers, overall Mod A provided.   Therapeutic Activities: In main therapy gym, pt instructed in dynamic standing balance activity with focus on unilateral support for carryover into standing ADLs. Pt tolerates x2 reps of ~1-2 mins of standing, CGA + RW.   Therapeutic Exercise: Pt performs 2x5 (5 sec holds) of WC pushups for UE strengthening required during functional transfers.   Pt remained sitting in WC with 4Ps assessed and immediate needs met. Pt continues to be appropriate for skilled OT intervention to promote further functional independence in ADLs/IADLs.   Therapy Documentation Precautions:  Precautions Precautions: Fall Precaution Comments: L hemi, Restrictions Weight Bearing Restrictions: No   Therapy/Group: Individual Therapy  Lou Cal, OTR/L, MSOT  07/03/2023, 6:05 AM

## 2023-07-04 DIAGNOSIS — F09 Unspecified mental disorder due to known physiological condition: Secondary | ICD-10-CM | POA: Diagnosis not present

## 2023-07-04 DIAGNOSIS — I63542 Cerebral infarction due to unspecified occlusion or stenosis of left cerebellar artery: Secondary | ICD-10-CM | POA: Diagnosis not present

## 2023-07-04 DIAGNOSIS — N179 Acute kidney failure, unspecified: Secondary | ICD-10-CM | POA: Diagnosis not present

## 2023-07-04 DIAGNOSIS — Z952 Presence of prosthetic heart valve: Secondary | ICD-10-CM | POA: Diagnosis not present

## 2023-07-04 DIAGNOSIS — G629 Polyneuropathy, unspecified: Secondary | ICD-10-CM

## 2023-07-04 DIAGNOSIS — I5033 Acute on chronic diastolic (congestive) heart failure: Secondary | ICD-10-CM | POA: Diagnosis not present

## 2023-07-04 DIAGNOSIS — I480 Paroxysmal atrial fibrillation: Secondary | ICD-10-CM | POA: Diagnosis not present

## 2023-07-04 LAB — GLUCOSE, CAPILLARY
Glucose-Capillary: 98 mg/dL (ref 70–99)
Glucose-Capillary: 98 mg/dL (ref 70–99)
Glucose-Capillary: 141 mg/dL — ABNORMAL HIGH (ref 70–99)

## 2023-07-04 LAB — BASIC METABOLIC PANEL
Anion gap: 8 (ref 5–15)
BUN: 28 mg/dL — ABNORMAL HIGH (ref 8–23)
CO2: 28 mmol/L (ref 22–32)
Calcium: 9 mg/dL (ref 8.9–10.3)
Chloride: 100 mmol/L (ref 98–111)
Creatinine, Ser: 1.28 mg/dL — ABNORMAL HIGH (ref 0.61–1.24)
GFR, Estimated: >60 mL/min (ref >60)
Glucose, Bld: 102 mg/dL — ABNORMAL HIGH (ref 70–99)
Potassium: 3.9 mmol/L (ref 3.5–5.1)
Sodium: 136 mmol/L (ref 135–145)

## 2023-07-04 MED ORDER — TORSEMIDE 20 MG PO TABS: 100.0000 mg | ORAL_TABLET | Freq: Every evening | ORAL | Status: DC

## 2023-07-04 MED ORDER — TORSEMIDE 20 MG PO TABS: 120.0000 mg | ORAL_TABLET | Freq: Every morning | ORAL | Status: DC

## 2023-07-04 MED ORDER — TORSEMIDE 20 MG PO TABS
120.0000 mg | ORAL_TABLET | Freq: Once | ORAL | Status: AC
  Administered 2023-07-04: 120 mg via ORAL

## 2023-07-04 MED ORDER — TORSEMIDE 20 MG PO TABS
120.0000 mg | ORAL_TABLET | Freq: Two times a day (BID) | ORAL | Status: DC
  Administered 2023-07-04 – 2023-07-05 (×2): 120 mg via ORAL
  Filled 2023-07-04 (×3): qty 6

## 2023-07-04 MED ORDER — TRAZODONE HCL 50 MG PO TABS
50.0000 mg | ORAL_TABLET | Freq: Every day | ORAL | Status: DC
  Administered 2023-07-04 – 2023-07-11 (×8): 50 mg via ORAL
  Filled 2023-07-04 (×8): qty 1

## 2023-07-04 NOTE — Progress Notes (Signed)
 Occupational Therapy Weekly Progress Note  Patient Details  Name: Lawrence [name] MRN: [id number] Date of Birth: Oct 14, 1955  Beginning of progress report period: June 29, 2023 End of progress report period: July 05, 2023  {CHL IP REHAB OT TIME CALCULATIONS:304400400}  {CHL IP REHAB OT TIME CALCULATIONS:304400400}  Patient has met {number 1-5:22450} of {number 1-5:20334} short term goals.  ***  Patient continues to demonstrate the following deficits: {impairments:3041632} and therefore will continue to benefit from skilled OT intervention to enhance overall performance with {ADL/iADL:3041649}.  Patient {LTG progression:3041653}.  {plan of ZWCH:8527782}  OT Short Term Goals {OT UMP:5361443}  Skilled Therapeutic Interventions/Progress Updates:   Session 1: Pt greeted *** for skilled OT session with focus on ***.   Pain: Pt reported ***/10 pain, stating "***" in reference to ***. OT offering intermediate rest breaks and positioning suggestions throughout session to address pain/fatigue and maximize participation/safety in session.   Functional Transfers:  Self Care Tasks:  Therapeutic Activities:  Therapeutic Exercise:   Education:  Pt remained *** with 4Ps assessed and immediate needs met. Pt continues to be appropriate for skilled OT intervention to promote further functional independence in ADLs/IADLs.   Session 2: Pt greeted *** for skilled OT session with focus on ***.   Pain: Pt reported ***/10 pain, stating "***" in reference to ***. OT offering intermediate rest breaks and positioning suggestions throughout session to address pain/fatigue and maximize participation/safety in session.   Functional Transfers:  Self Care Tasks:  Therapeutic Activities:  Therapeutic Exercise:   Education:  Pt remained *** with 4Ps assessed and immediate needs met. Pt continues to be appropriate for skilled OT intervention to promote further functional independence in  ADLs/IADLs.    Therapy Documentation Precautions:  Precautions Precautions: Fall Precaution Comments: L hemi, Restrictions Weight Bearing Restrictions: No   Therapy/Group: Individual Therapy  Lou Cal, OTR/L, MSOT  07/04/2023, 6:12 PM

## 2023-07-04 NOTE — Progress Notes (Addendum)
PROGRESS NOTE   Subjective/Complaints: Patient reports poor sleep at night, he would like trazodone and mirtazapine restarted.  Reports aching pain in his legs and feet-this pain is chronic.  ROS: Patient denies fever, chills, malaise, rash, sore throat, blurred vision, nausea, vomiting, diarrhea, cough or chest pain, headache, or mood change. + dizzy when standing-improved +SOB-stable over the past few days +Chest pain-resolved   + Peripheral neuropathy-more painful today  Objective:   No results found. Recent Labs    07/03/23 0614  WBC 9.6  HGB 14.4  HCT 43.8  PLT 309    Recent Labs    07/03/23 0614 07/04/23 0516  NA 134* 136  K 3.4* 3.9  CL 94* 100  CO2 27 28  GLUCOSE 132* 102*  BUN 32* 28*  CREATININE 1.41* 1.28*  CALCIUM 9.0 9.0    Intake/Output Summary (Last 24 hours) at 07/04/2023 1336 Last data filed at 07/04/2023 1248 Gross per 24 hour  Intake 720 ml  Output 1000 ml  Net -280 ml        Physical Exam: Vital Signs Blood pressure 99/68, pulse 68, temperature 97.9 F (36.6 C), temperature source Oral, resp. rate 16, height 6\' 2"  (1.88 m), weight (!) 156.4 kg, SpO2 100%.     General: , No apparent distress, morbidly obese.  Sitting in bed HEENT: Head is normocephalic, atraumatic,MMM neck: Supple.  No LAD Heart: Reg rate and rhythm.   Zio patch removed Chest: CTA bilaterally without wheezes, rales, or rhonchi; nonlabored breathing.  On 2 L nasal cannula.   Abdomen: Soft, mildly-distended, nontender, bowel sounds positive. Extremities: No clubbing, cyanosis, trace bilateral lower extremity edema-unchanged from prior exam.  Bilateral lower extremities wrapped  Psych: Appropriate mood and affect, cooperative Skin: Dry dressing in place to  b/l ankles, no apparent breakdown.   Neuro:  Mental Status: AAOx3, memory intact, fund of knowledge appropriate.  No apparent deficits. Cranial nerves:  positive left trigeminal sensory deficit.   MOTOR: Moving all 4 extremities antigravity and against resistance.  From prior exams: RUE: 5/5 Deltoid, 5/5 Biceps, 5/5 Triceps,5/5 Grip LUE: 4/5 Deltoid, 4/5 Biceps, 4/5 Triceps, 4/5 Grip RLE: HF 5/5, KE 5/5, ADF 5/5, APF 5/5 LLE: HF 1-2/5, KE 1/5, ADF 2/5, APF 2/5     No hypertonia noted    SENSORY: Altered sensation to light touch in left upper extremity and left lower extremity, as well as bilateral feet and all toes. Minimal tenderness b/l Feet and ankles    Assessment/Plan: 1. Functional deficits which require 3+ hours per day of interdisciplinary therapy in a comprehensive inpatient rehab setting. Physiatrist is providing close team supervision and 24 hour management of active medical problems listed below. Physiatrist and rehab team continue to assess barriers to discharge/monitor patient progress toward functional and medical goals  Care Tool:  Bathing    Body parts bathed by patient: Right arm, Left arm, Chest, Abdomen, Front perineal area, Face   Body parts bathed by helper: Right upper leg, Left upper leg, Right lower leg, Left lower leg     Bathing assist Assist Level: Maximal Assistance - Patient 24 - 49%     Upper Body Dressing/Undressing Upper body dressing  What is the patient wearing?: Pull over shirt    Upper body assist Assist Level: Minimal Assistance - Patient > 75%    Lower Body Dressing/Undressing Lower body dressing      What is the patient wearing?: Pants     Lower body assist Assist for lower body dressing: Maximal Assistance - Patient 25 - 49%     Toileting Toileting    Toileting assist Assist for toileting: Maximal Assistance - Patient 25 - 49%     Transfers Chair/bed transfer  Transfers assist     Chair/bed transfer assist level: Minimal Assistance - Patient > 75%     Locomotion Ambulation   Ambulation assist   Ambulation activity did not occur: Safety/medical  concerns  Assist level: Minimal Assistance - Patient > 75% Assistive device: Walker-rolling Max distance: 45   Walk 10 feet activity   Assist  Walk 10 feet activity did not occur: Safety/medical concerns  Assist level: Minimal Assistance - Patient > 75% Assistive device: Walker-rolling   Walk 50 feet activity   Assist Walk 50 feet with 2 turns activity did not occur: Safety/medical concerns         Walk 150 feet activity   Assist Walk 150 feet activity did not occur: Safety/medical concerns         Walk 10 feet on uneven surface  activity   Assist Walk 10 feet on uneven surfaces activity did not occur: Safety/medical concerns         Wheelchair     Assist Is the patient using a wheelchair?: Yes Type of Wheelchair: Manual    Wheelchair assist level: Dependent - Patient 0%      Wheelchair 50 feet with 2 turns activity    Assist        Assist Level: Dependent - Patient 0%   Wheelchair 150 feet activity     Assist      Assist Level: Dependent - Patient 0%   Blood pressure 99/68, pulse 68, temperature 97.9 F (36.6 C), temperature source Oral, resp. rate 16, height 6\' 2"  (1.88 m), weight (!) 156.4 kg, SpO2 100%.  Medical Problem List and Plan: 1. Functional deficits secondary to acute ischemic left cerebellar infarct with hx of prior CVA             -patient may shower, please cover RLE skin breakdown             -ELOS/Goals: 16 to 20 days, PT OT supervision to min assist, SLP supervision MRI             -Continue CIR  -Team conference tomorrow  2.  Antithrombotics: -DVT/anticoagulation:  Pharmaceutical: Eliquis             -antiplatelet therapy: N/A 3. Chronic pain/Pain Management: Has spinal cord stimulator. Continue Tylenol prn.   --will d/c hydrocodone and monitor as denies pain at this time.  --continue robaxin prn  4. Mood/Behavior/Sleep: LCSW to follow for evaluation and support.              -antipsychotic agents:  Abilify-->will change to bedtime as at home.              --chronic insomnia was being managed with Trazodone and Remeron--> d/c per neurology --has PTSD/Nightmares but sleeping well in hospital. Resume Prasozin 2 mg/HS if BP stable.  -11/26 will restart trazodone at lower dose of 50 mg nightly 5. Neuropsych/cognition: This patient is capable of making decisions on his own behalf. 6. Skin/Wound Care: Routine  pressure relief measures.              -Bilateral lower extremity venous stasis ulcers.  Continue current wound care with Aquacel and Xeroform 7. Fluids/Electrolytes/Nutrition: Monitor I/O. Check CMET in am. Recheck Mg and Phos level in am8.    8. HTN: BP stable. Resume Prazosin when able  --Wide complex tachycardia: No significant arrhthymias seen per Dr. Tenny Craw.              --metoprolol 12.5 mg bid and can be increased to 25 mg bid.   -11/22 metoprolol was discontinued due to bradycardia  -11-23: Normotensive, mild intermittent bradycardia, appreciate cardiology assistance and medication titration  -11/26 BP soft but stable continue current regimen  -Toprol-XL 25 mg daily per cardiology     07/04/2023    1:13 PM 07/04/2023    4:46 AM 07/04/2023    4:41 AM  Vitals with BMI  Weight  344 lbs 13 oz   BMI  44.25   Systolic 99  113  Diastolic 68  65  Pulse 68  68     9. T2DM with peripheral neuropathy: Hgb A1C-6.5 and well controlled. Monitor BS ac/hs and use SSI for elevated BS.              --continue insulin glargine 40 units daily with novolog 12 units TID  -11/22 fair control, continue to monitor for now  11-26 controlled continue to monitor  CBG (last 3)  Recent Labs    07/03/23 1647 07/03/23 2123 07/04/23 1123  GLUCAP 115* 108* 98    10. Chronic diastolic CHF: Added 1500 cc FR per recommendations. HH diet  --Daily weight and monitor for signs of overload--weight at admission 11/16- 373 lbs.  --evaluated by cards today-->BB increased to 25 mg BID, Demadex bid.  Zaroxolyn X 1 dose --cards will follow for response in am.   11/22 cardiology following-appreciate assistance, cardiology holding off on spironolactone due to dizziness 11-23: No new weights overnight, no apparent fluid overload, continue current regimen 11/24: weight uptrending mildly from 11-22, no external signs of worsening volume overload, chest x-ray clear.  Per cardiology, could consider IV Lasix if additional diuresis needed, will defer at this time given medication changes below. 11/26 per cardiology continue torsemide 120 mg twice daily  Filed Weights   07/02/23 0440 07/03/23 0500 07/04/23 0446  Weight: (!) 157.2 kg (!) 158.3 kg (!) 156.4 kg    11. Wide complex tachycardia/hx PAF/bradycardia/PVCs: Started metoprolol 25mg  BID by cardiology. On Eliquis  -11/22 Zio started by cardiology, metoprolol was stopped.  Cardiology feels that pulse may be undercalled due to PVCs.  11-24: Per recommendation by EP, resumed metoprolol 12.5 mg twice daily despite ongoing bradycardia.  Nursing informed not to hold medication for heart rate.  Unfortunately, unable to interrogate Zio patch.  Appreciate cardiology input in this case.  -11/25 cardiology has seen him today, will monitor for recommendations, zio patch in place  12. Seizure d/o: Has been seizure free on Keppra and Vimpat -->followed by Dr. Teresa Coombs.  13. Major depressive: PTA--was on Effexor, Abilify, Remeron and Trazodone.              --lethargy could be due to daytime Abilify-->will change to nights             --Remeron and trazodone on hold per neuro  -Continue melatonin 14. GAD w/panic attacks/PTSD: Chronic issues onoging and followed by [facility].  --Was on Prazosin and Buspar.             --  Mood stable on current dose Buspar. Monitor for now  15. Hypokalemia: Improved with supplementation.  -11/21 potassium lower today at 3.1, will change potassium supplement to 20 mEq twice daily.  Recheck tomorrow  -11/22 K+ up to 3.2, increase to  in am and in evening  -11/22 potassium supplement increased to 40 mEq twice daily; repeat on Monday  -11/25 potassium up to 3.4 continue supplementation  11/26 improved today recheck tomorrow 16. Leucocytosis: WBC trending down-->monitor  -11/25 within normal limits 17. OSA: Now compliant with CPAP but has been requiring 5 L oxygen per [place].              --question OHS as contributing factor leading to hypersomnia. Wean oxygen as able.   -Appears to be tolerating weaning down nasal O2, he reports he used his CPAP at night  07-01-23: Compliant with CPAP, stable on 2 L nasal cannula  18. COPD: On Trilegy at home -->on Breo/Incruse  Continue to wean off oxygen.   19. Neuropathy: Managed with spinal cord stimulator and Lyrica (Dr. Beverly Gust Shaw/[facility]).   -11-23: Patient complaining of worsening symptoms, especially at night.  However, appears already on max dose Lyrica and venlafaxine.  Defer further medication adjustments to primary team.   20. Gout flare R great toe: On colchicine bid for 5 more days, resume allopurinol in 2 to 3 weeks  -11/25 colchicine was decreased to daily, discontinue after 1 more day 21. CKD 3A:BUN/Scr-38/1.31 at admission.   -Creatinine a little higher today at 1.41/BUN 32, patient is on diuretics per cardiology  -11/26 torsemide was held yesterday, creatinine down to 1.28 22. Morbid Obesity. Body mass index is 47.89 kg/m.             -Dietary counseling    LOS: 6 days A FACE TO FACE EVALUATION WAS PERFORMED  Fanny Dance 07/04/2023, 1:36 PM

## 2023-07-04 NOTE — Progress Notes (Signed)
Speech Language Pathology Daily Session Note  Patient Details  Name: Lawrence [name] MRN: [id number] Date of Birth: [birth year]  Today's Date: 07/04/2023 SLP Individual Time: 0803-0900 SLP Individual Time Calculation (min): 57 min  Short Term Goals: Week 1: SLP Short Term Goal 1 (Week 1): Patient will solve moderately complex problems with 90% acc given sup A SLP Short Term Goal 2 (Week 1): Patient will recall novel information given a 10- 15 minute delay with 90% acc with sup A SLP Short Term Goal 3 (Week 1): Patient will selectively attend to task in a mildly distracting environment for >25 minutes with sup A SLP Short Term Goal 4 (Week 1): Patient will utilize word finding strategies at the sentence level with sup A  Skilled Therapeutic Interventions:   Patient was seen in am to address cognitive re- training. Pt was alert and seen at bedside. He initially reported 3/10 pain in left foot however as session progressed pt reported increased cramping in mainly right foot. Nursing in and out during session and providing medication management. During session, pt became emotional regarding pain and medical concerns. SLP provided active listening and encouragement as needed. Pt able to be redirected toward task when appropriate. SLP reviewed current medication list. Pt able to utilize myChart to locate medications he was taking at home. SLP guided pt in comparing and contrasting lists. Pt reports current medication regime is consistent with placing AM and PM meds in separate draws. He reports hx of utilizing pill boxes however as medication amount increased they would not all fit in pill box. SLP challenged pt in completion of pill box this date to assess organization, initiation, error awareness, and problem solving. Given review of task, pt filled pill box with 4 meds mod I without errors. At conclusion of session, pt was left seated upright in bed with call button within reach. SLP to continue POC.    Pain Pain Assessment Pain Scale: 0-10 Pain Score: 8  Pain Type: Acute pain Pain Location: Foot Pain Orientation: Right Pain Descriptors / Indicators: Cramping Pain Intervention(s): Medication (See eMAR)  Therapy/Group: Individual Therapy  Lawrence [name] 07/04/2023, 8:54 AM

## 2023-07-04 NOTE — Progress Notes (Addendum)
Rounding Note    Patient Name: Lawrence [name] Date of Encounter: 07/04/2023  [facility] Cardiologist: Armanda Magic, MD / CHF service  Subjective   Currently in bed complaining of severe pain in his feet bilaterally.  Denies any chest pain or shortness of breath.  Inpatient Medications    Scheduled Meds:  apixaban  5 mg Oral BID   ARIPiprazole  5 mg Oral Q supper   busPIRone  10 mg Oral TID   cholecalciferol  2,000 Units Oral Daily   cyanocobalamin  1,000 mcg Oral Daily   fenofibrate  160 mg Oral Daily   ferrous sulfate  325 mg Oral Q breakfast   fluticasone furoate-vilanterol  1 puff Inhalation Daily   And   umeclidinium bromide  1 puff Inhalation Daily   icosapent Ethyl  2 g Oral BID   insulin aspart  0-5 Units Subcutaneous QHS   insulin aspart  0-9 Units Subcutaneous TID WC   insulin aspart  12 Units Subcutaneous TID WC   insulin glargine-yfgn  40 Units Subcutaneous Q24H   lacosamide  150 mg Oral BID   levETIRAcetam  500 mg Oral BID   metoprolol succinate  25 mg Oral Daily   pantoprazole  40 mg Oral Daily   potassium chloride  40 mEq Oral TID   pregabalin  150 mg Oral TID   rosuvastatin  40 mg Oral Q supper   venlafaxine XR  225 mg Oral Q breakfast   Continuous Infusions:   PRN Meds: acetaminophen, alum & mag hydroxide-simeth, bisacodyl, diphenhydrAMINE, guaiFENesin-dextromethorphan, melatonin, methocarbamol, prochlorperazine **OR** prochlorperazine **OR** prochlorperazine, sodium phosphate   Vital Signs    Vitals:   07/03/23 1309 07/03/23 1948 07/04/23 0441 07/04/23 0446  BP: 106/85 (!) 119/58 113/65   Pulse: 76 78 68   Resp: 19 18 18    Temp: 97.8 F (36.6 C) 98.1 F (36.7 C) 97.9 F (36.6 C)   TempSrc: Oral     SpO2: 98% 97% 98%   Weight:    (!) 156.4 kg  Height:        Intake/Output Summary (Last 24 hours) at 07/04/2023 0955 Last data filed at 07/04/2023 0820 Gross per 24 hour  Intake 1610 ml  Output 1700 ml  Net -90 ml       07/04/2023    4:46 AM 07/03/2023    5:00 AM 07/02/2023    4:40 AM  Last 3 Weights  Weight (lbs) 344 lb 12.8 oz 349 lb 0.5 oz 346 lb 7.3 oz  Weight (kg) 156.4 kg 158.32 kg 157.15 kg      Telemetry    No tele    - Personally Reviewed  ECG    No new  - Personally Reviewed  Physical Exam   GEN: Well nourished, well developed in no acute distress HEENT: Normal NECK: No JVD; No carotid bruits LYMPHATICS: No lymphadenopathy CARDIAC:RRR, no rubs, gallops 2/6 systolic murmur at the right upper sternal border rating to left upper sternal border RESPIRATORY:  Clear to auscultation without rales, wheezing or rhonchi  ABDOMEN: Soft, non-tender, non-distended MUSCULOSKELETAL: Legs are wrapped bilaterally; No deformity  SKIN: Warm and dry NEUROLOGIC:  Alert and oriented x 3 PSYCHIATRIC:  Normal affect  Labs    High Sensitivity Troponin:  No results for input(s): "TROPONINIHS" in the last 720 hours.   Chemistry Recent Labs  Lab 06/28/23 0551 06/29/23 0515 06/30/23 0646 07/03/23 0614 07/04/23 0516  NA 139 137 135 134* 136  K 3.6 3.1* 3.2* 3.4*  3.9  CL 101 97* 93* 94* 100  CO2 29 31 30 27 28   GLUCOSE 91 115* 147* 132* 102*  BUN 23 25* 35* 32* 28*  CREATININE 1.04 1.27* 1.35* 1.41* 1.28*  CALCIUM 8.7* 8.8* 9.3 9.0 9.0  MG 2.5* 2.3  --   --   --   PROT 6.6 7.1  --   --   --   ALBUMIN 3.4* 3.7  --   --   --   AST 16 18  --   --   --   ALT 16 19  --   --   --   ALKPHOS 44 47  --   --   --   BILITOT 0.7 0.7  --   --   --   GFRNONAA >60 >60 58* 55* >60  ANIONGAP 9 9 12 13 8     Lipids  No results for input(s): "CHOL", "TRIG", "HDL", "LABVLDL", "LDLCALC", "CHOLHDL" in the last 168 hours.   Hematology Recent Labs  Lab 06/28/23 0551 06/29/23 0515 07/03/23 0614  WBC 10.4 11.7* 9.6  RBC 4.52 4.92 4.97  HGB 12.8* 14.1 14.4  HCT 40.1 42.8 43.8  MCV 88.7 87.0 88.1  MCH 28.3 28.7 29.0  MCHC 31.9 32.9 32.9  RDW 16.4* 16.4* 15.9*  PLT 291 312 309   Thyroid  No results for  input(s): "TSH", "FREET4" in the last 168 hours.   BNPNo results for input(s): "BNP", "PROBNP" in the last 168 hours.  DDimer No results for input(s): "DDIMER" in the last 168 hours.   Radiology    DG CHEST PORT 1 VIEW  Result Date: 07/02/2023 CLINICAL DATA:  141880 SOB (shortness of breath) [id number] EXAM: PORTABLE CHEST 1 VIEW COMPARISON:  June 29, 2023 FINDINGS: The cardiomediastinal silhouette is unchanged and enlarged in contour. No pleural effusion. No pneumothorax. No acute pleuroparenchymal abnormality. IMPRESSION: No acute cardiopulmonary abnormality. Electronically Signed   By: Meda Klinefelter M.D.   On: 07/02/2023 16:23    Cardiac Studies   ECHO: 06/26/2023 1. Technically difficult study.   2. Left ventricular ejection fraction, by estimation, is 50 to 55%. The left ventricle has low normal function. Left ventricular endocardial border not optimally defined to evaluate regional wall motion. The left ventricular internal cavity size was  dilated. Left ventricular diastolic function could not be evaluated.   3. Right ventricular systolic function is normal. The right ventricular size is normal. Mildly increased right ventricular wall thickness.   4. There is no evidence of cardiac tamponade.   5. The mitral valve is grossly normal. No evidence of mitral valve regurgitation. No evidence of mitral stenosis.   6. The aortic valve was not well visualized. Aortic valve regurgitation is not visualized. But based on doppler parameter cannot rule out mild (per max velocity and MG) to moderate aortic stenosis (dimensional index).   7. Aortic ascending aorta is structurally normal, with no evidence of dilitation.   8. Agitated saline contrast bubble study was inconclusive.   Conclusion(s)/Recommendation(s): No intracardiac source of embolism detected on this transthoracic study. Consider a transesophageal echocardiogram to exclude cardiac source of embolism if clinically indicated.      Patient Profile      Lawrence [name] is a 67 y.o. male with a hx of paroxysmal atrial fibrillation, aortic stenosis status post TAVR, chronic HFpEF, PVCs, seizures, Hypertension, hyperlipidemia, type 2 diabetes, OSA uses BiPAP, who is being seen 06/26/2023 for the evaluation of wide-complex tachycardia at the request of Dr. Thedore Mins.  Assessment & Plan    Abnormal Tele Rhythm   -initially consulted for possible torsades but after review of telemetry was felt to be artifact while up working with PT. There was no syncope although he was having some dizziness. QT interval was normal.   -Pulse noted to be slow intermittently -->> likely PVCs   -Currently wearing a Zio monitor but could not download the data to review -Continue Toprol 25 mg daily  Chronic HFpEF    - Volume status is difficult given pt's body habitus - He has no shortness of breath at this time and grossly appears euvolemic on exam - We held his dose of torsemide last night with improvement in serum creatinine and continued brisk diuresis - He put out 1.7 L yesterday and is net -10 L since admission - His weight is 344 pounds today (weight down 9 pounds from admission) and looks like dry weight is anywhere between 341 and 346 pounds. - His spironolactone was increased to 25 mg daily at last office visit earlier in the month by AHF clinic and serum creatinine has been anywhere from 1.18->>1.41 since then.  Serum creatinine 1.28 today - He appears euvolemic on exam,  K+ 3.4  - will check ReDS Vest reading today - discussed wutg AHF and will continue on Torsemide 120mg  BID without Cleda Daub or Farxiga for now and reassess outpt -on standing Kdur 40 meq Tid >> potassium 3.9 today>>check BMET in am -Continue Toprol-XL 25 mg daily  Severe AS  -s/p TAVR -mean TAVR gradient on echo 03/2023  HTN -BP controlled on exam -continue Toprol XL 25mg  daily  PAF -continue Apixaban 5mg  BID and Toprol XL 25mg   daily  Nonobstructive CAD -denies any anginal sx -continue statin and BB -no ASA due to DOAC  HLD -LDL goal < 70 -LDL 79 this admit -continue fenofibrate 160mg  daily, Vascepa 2gm BID and Crestor 40mg  daily (increased this admission) -repeat FLP and ALT in 6 weeks  [facility] will sign off.   Medication Recommendations: Torsemide 120 mg twice daily, Crestor 40 mg daily, K. Dur 40 mEq 3 times daily, Toprol XL 25 mg daily, Vascepa 2 g twice daily, fenofibrate 160 mg daily, Eliquis 5 mg twice daily Other recommendations (labs, testing, etc): BMET 1 week in advance heart failure clinic Follow up as an outpatient:  AHF clinic in 1 week  I have personally spent 45 minutes involved in face-to-face and non-face-to-face activities for this patient on the day of the visit. Professional time spent includes the following activities: Preparing to see the patient (review of tests including 2D echo), Obtaining and/or reviewing separately obtained history (admission/discharge record and last OV note by AHF clinic), Performing a medically appropriate examination and/or evaluation , Ordering medications/tests/procedures, referring and communicating with other health care professionals, Documenting clinical information in the EMR, Independently interpreting results (not separately reported), Communicating results to the patient/family/caregiver, Counseling and educating the patient/family/caregiver and Care coordination (not separately reported).    For questions or updates, please contact [facility] Please consult [email or web address] for contact info under        Signed, Armanda Magic, MD  07/04/2023, 9:55 AM

## 2023-07-04 NOTE — Consult Note (Signed)
Neuropsychological Consultation Comprehensive Inpatient Rehab   Patient:   Lawrence [name]   DOB:   Jul 04, 1956  MR Number:  [id number]  Location:  [facility] [address] CENTER [address] [place] [address] Dept: [phone] Loc: [phone]           Date of Service:   07/04/2023  Start Time:   2 PM End Time:   3 PM  Provider/Observer:  Arley Phenix, Psy.D.       Clinical Neuropsychologist       Billing Code/Service: [phone]  Reason for Service:    Lawrence [name] is an 67 year old male referred for neuropsychological consultation due to coping and adjustment issues and ongoing cognitive deficits post cerebrovascular accident and is currently admitted onto the comprehensive inpatient rehabilitation unit.  Patient has a significant past medical history for CAF on Eliquis, CVA with residual deficits, type 2 diabetes with neuropathy, major depressive disorder, generalized anxiety disorder, chronic pain with previous placement of spinal cord stimulator, seizure disorder, COPD, OSA, TAVR, chronic diastolic congestive heart failure, shortness of breath with activity, morbid obesity.  Patient was admitted on 06/25/2023 when he was found unresponsive in his recliner.  Patient was able to be aroused with sternal rub but displayed slurred speech and left-sided weakness.  There was Some concern that his symptoms were residual effects of his old stroke symptoms, unwitnessed seizure or hypercarbia.  MRI brain did reveal a small left inferior infarct felt to be silent.  Neurologist recommended continuing Eliquis.  Patient was in acute respiratory failure and treated with supplemental oxygen.  Patient has been refusing CPAP use although he has been diagnosed with obstructive sleep apnea and significant cardiopulmonary issues.  Patient has had multiple recent admissions because of medical issues.  Patient does have an aide 4 days a week for  assistance and home management.  During the clinical visit today, the patient was oriented to being in the hospital but very vague about details with regard to his current hospitalization.  This is not particularly unusual given the number of hospitalizations recently and his ongoing cognitive deficits including cardiovascular history and long-term pulmonary issues.  Patient reported that his biggest issue was his neuropathy which she has been dealing with some time but was unsure why they had decreased his pain medication.  A discussion was had about some of the pain medications and the limits regarding reduced arousal and reduced heart/breathing frequency with these medicines.  Patient has a history of significant depression and anxiety reports these are not inhibiting his capacity to engage in therapies but that his pain is a primary issue.  Patient with significant attentional deficits displayed today and limited memory and learning capacity.  HPI for the current admission:    HPI:  Lawrence [name] is a 67 year old male with history of CAF-on eliquis, CVA w/residual L-HP, T2DM with neuropathy, MDD, GAD, chronic pain s/p spinal cord stimulator, seizure d/o, COPD, OSA, TAVR, chronic diastolic CHF, SOB w/activity,chronic stasis ulcers,  morbid obesity-BMI 48;  who was admitted on 06/25/23 after found unresponsive in his recliner.  He aroused with sternal rub but was noted to have slurred speech with left sided weakness.  Dr. Wilford Corner questioned recurdescence of old stroke symptoms, unwitnessed seizure or hypercarbia with narcosis as cause. MRI brain revealed small left inferior infarct felt to be silent and Dr. Pearlean Brownie recommended continuing Eliquis bid. Acute respiratory failure treated with supplemental oxygen and he has been  refusing CPAP.   Lyrica and Trazodone held per Dr. Teresa Coombs and BP meds held to allow for permissive HTN. He was noted to have wide complex tachycardia felt to be atrial in nature and low  dose BB resumed with recommendations to titrate to 25 mg BID. He continues to report SOB/CP possibly due to fluid retention related to HFpEF, cardiology started [facility] today.  Plan for outpatient Zio patch.    WOC consulted for input on chronic stastis ulcers LLE with open wounds and RLE with healing wounds. Aquacel added for draining wound LLE and xeroform to scabbed areas on RLE with compression for edema control.    He has had multiple recent admissions and has aide 4 days/week to assist with B/D and home management. .     Lives in a one-story apartment no steps to enter.  He reports his significant other can assist after discharge.  Medical History:   Past Medical History:  Diagnosis Date   Acquired dilation of ascending aorta and aortic root (HCC)    40mm by echo 01/2021   Adenomatous colon polyp 2007   Anemia    Anxiety    Asthma    BPH without obstruction/lower urinary tract symptoms 02/22/2017   Chronic diastolic (congestive) heart failure (HCC)    Chronic venous stasis 03/07/2019   COPD (chronic obstructive pulmonary disease) (HCC)    Coronary artery calcification seen on CAT scan    Depression    Diabetic neuropathy (HCC) 09/11/2019   History of colon polyps 08/24/2018   Hypertension    Morbid obesity (HCC)    OSA (obstructive sleep apnea)    Pain due to onychomycosis of toenails of both feet 09/11/2019   Peripheral neuropathy 02/22/2017   Primary osteoarthritis, left shoulder 03/05/2017   PTSD (post-traumatic stress disorder)    Pure hypercholesterolemia    QT prolongation 03/07/2019   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 Lynnette Caffey and Dr. Laneta Simmers   Seizures [facility])    Severe aortic stenosis    Sinus tachycardia 03/07/2019   Sleep apnea    CPAP   Type 2 diabetes mellitus with vascular disease (HCC) 09/11/2019         Patient Active Problem List   Diagnosis Date Noted   Polyneuropathy 07/04/2023    AKI (acute kidney injury) (HCC) 07/03/2023   Orthostatic hypotension 07/03/2023   Stage 3a chronic kidney disease (HCC) 06/30/2023   Acute stroke due to occlusion of left cerebellar artery (HCC) 06/28/2023   Type 2 diabetes mellitus with diabetic polyneuropathy, with long-term current use of insulin (HCC) 06/28/2023   CVA (cerebral vascular accident) (HCC) 06/25/2023   Atrial fibrillation, chronic (HCC) 06/25/2023   Acute CVA (cerebrovascular accident) (HCC) 06/25/2023   S/P TAVR (transcatheter aortic valve replacement) 02/07/2023   Acute hypoxic respiratory failure (HCC) 01/23/2023   Nail, injury by, initial encounter 12/09/2022   Heart failure (HCC) 10/27/2022   Gout attack 10/27/2022   CHF (congestive heart failure) (HCC) 10/26/2022   Right sided weakness 05/26/2022   Dysarthria 05/25/2022   Ischemic stroke (HCC) 02/25/2022   Hemiparesis affecting left side as late effect of cerebrovascular accident (CVA) (HCC) 02/25/2022   Durable power of attorney for healthcare exists  02/25/2022   Depression 08/07/2021   Paronychia of great toe, left 07/07/2021   Stroke-like symptoms 04/27/2021   Ambulatory dysfunction 04/27/2021   Left-sided weakness 02/09/2021   Atrial fibrillation with rapid ventricular response (HCC) 02/08/2021   AF (  paroxysmal atrial fibrillation) (HCC) 01/25/2021   COPD (chronic obstructive pulmonary disease) (HCC) 01/25/2021   Aortic stenosis 01/21/2021   Acquired dilation of ascending aorta and aortic root (HCC) 01/21/2021   Coronary artery disease due to lipid rich plaque    SVT (supraventricular tachycardia) (HCC)    Seizure disorder (HCC)    Primary hypertension 10/17/2020   Hypokalemia 10/17/2020   Cellulitis of right leg 12/04/2019   Pain due to onychomycosis of toenails of both feet 09/11/2019   Diabetic neuropathy (HCC) 09/11/2019   Diabetes mellitus (HCC) 09/11/2019   Sinus tachycardia 03/07/2019   Chronic venous stasis 03/07/2019   History of anemia  12/26/2018   Anemia 08/24/2018   History of colon polyps 08/24/2018   Do not resuscitate status 08/24/2018   Chronic diastolic CHF (congestive heart failure) (HCC) 08/18/2018   Coronary artery calcification seen on CAT scan    Pure hypercholesterolemia    Chest pain    Shortness of breath    Right foot pain 01/13/2018   Acute on chronic diastolic CHF (congestive heart failure) (HCC) 01/13/2018   Type 2 diabetes mellitus with obesity (HCC) 01/13/2018   Lower extremity cellulitis 11/07/2017   Primary osteoarthritis, left shoulder 03/05/2017   BPH without obstruction/lower urinary tract symptoms 02/22/2017   Hematuria 02/22/2017   Peripheral neuropathy 02/22/2017   Physical deconditioning 02/22/2017   Class 3 obesity    PTSD (post-traumatic stress disorder)    Anasarca 01/31/2017   Bilateral lower extremity edema 01/31/2017   OSA (obstructive sleep apnea)     Behavioral Observation/Mental Status:   MIAN KOLM  presents as a 67 y.o.-year-old Right handed Caucasian Male who appeared his stated age. his dress was Appropriate and he was Fairly Groomed and his manners were Appropriate to the situation.  his participation was indicative of Inattentive and Redirectable behaviors.  There were physical disabilities noted.  he displayed an appropriate level of cooperation and motivation.    Interactions:    Active Inattentive and Redirectable  Attention:   abnormal and attention span appeared shorter than expected for age  Memory:   abnormal; global memory impairment noted  Visuo-spatial:   not examined  Speech (Volume):  normal  Speech:   normal; some word finding issues noted  Thought Process:  Coherent and Circumstantial  Coherent and Directed  Though Content:  WNL; not suicidal and not homicidal  Orientation:   person, place, and situation  Judgment:   Poor  Planning:   Poor  Affect:    Blunted and  Lethargic  Mood:    Dysphoric  Insight:   Fair  Intelligence:   normal  Psychiatric History:  Patient with past medical history including significant depression and anxiety in the past but denying current severe depression or anxiety.  Family Med/Psych History:  Family History  Problem Relation Age of Onset   CAD Maternal Grandfather    Diabetes Other    Diabetes Mellitus II Neg Hx    Colon cancer Neg Hx    Esophageal cancer Neg Hx    Inflammatory bowel disease Neg Hx    Liver disease Neg Hx    Pancreatic cancer Neg Hx    Rectal cancer Neg Hx    Stomach cancer Neg Hx    Sleep apnea Neg Hx    Impression/DX:   HPI:  WHITTAKER HLINKA is a 67 year old male with history of CAF-on eliquis, CVA w/residual L-HP, T2DM with neuropathy, MDD, GAD, chronic pain s/p spinal cord stimulator, seizure d/o,  COPD, OSA, TAVR, chronic diastolic CHF, SOB w/activity,chronic stasis ulcers,  morbid obesity-BMI 48;  who was admitted on 06/25/23 after found unresponsive in his recliner.  He aroused with sternal rub but was noted to have slurred speech with left sided weakness.  Dr. Wilford Corner questioned recurdescence of old stroke symptoms, unwitnessed seizure or hypercarbia with narcosis as cause. MRI brain revealed small left inferior infarct felt to be silent and Dr. Pearlean Brownie recommended continuing Eliquis bid. Acute respiratory failure treated with supplemental oxygen and he has been refusing CPAP.   Lyrica and Trazodone held per Dr. Teresa Coombs and BP meds held to allow for permissive HTN. He was noted to have wide complex tachycardia felt to be atrial in nature and low dose BB resumed with recommendations to titrate to 25 mg BID. He continues to report SOB/CP possibly due to fluid retention related to HFpEF, cardiology started [facility] today.  Plan for outpatient Zio patch.    WOC consulted for input on chronic stastis ulcers LLE with open wounds and RLE with healing wounds. Aquacel added for draining wound LLE and  xeroform to scabbed areas on RLE with compression for edema control.    He has had multiple recent admissions and has aide 4 days/week to assist with B/D and home management. .     Lives in a one-story apartment no steps to enter.  He reports his significant other can assist after discharge.  Disposition/Plan:  Today I attempted to work on coping and adjustment issues and try to motivate the patient to be more active in his self-care particularly around his medical issues.  Patient has significant cardiovascular and pulmonary issues that he is not always diligent in taking care of and has had previous strokes with more recent silent stroke and history of seizure disorder and not always compliant with CPAP.  Patient has reduced cognitive functioning from baseline consistent with his past medical history.          Electronically Signed   _______________________ Arley Phenix, Psy.D. Clinical Neuropsychologist

## 2023-07-04 NOTE — Progress Notes (Signed)
Occupational Therapy Session Note  Patient Details  Name: Lawrence [name] MRN: [id number] Date of Birth: 11-03-1955  Today's Date: 07/04/2023 OT Individual Time: 9528-4132 OT Individual Time Calculation (min): 56 min   Today's Date: 07/04/2023 OT Individual Time: 4401-0272 OT Individual Time Calculation (min): 31 min   Short Term Goals: Week 1:  OT Short Term Goal 1 (Week 1): Pt will perform sit to stand with min A for ADL tasks OT Short Term Goal 2 (Week 1): Pt will stand for 1 min with min A sink side during ADL OT Short Term Goal 3 (Week 1): Pt will access bathroom toilet with commode support with min A SPT  Skilled Therapeutic Interventions/Progress Updates:   Session 1: Pt greeted resting in bed for skilled OT session with focus on BADL retraining.   Pain: Pt with heightened pain in RLE due to spasming, improving with mobility. OT offering intermediate rest breaks and positioning suggestions throughout session to address pain/fatigue and maximize participation/safety in session.   Functional Transfers: Pt comes to EOB with supervision with HOB elevated + use of bed rail. Pt attempts STS from heightened EOB, requiring Mod + HDRW (+2 present for safety), unable to reach full stance with BLE bucking and therapist assisting return to EOB. Bed further elevated, Mod A + HDRW for successful stand, CGA-light Min A + HDRW for stand-pivot from EOB>WC. Unsuccessful STS at sink-side with use of sink-edge, Mod A + HDRW for x2 STS for LB care. Arm-rests adjusted on WC for more efficient power-up during STS.   Self Care Tasks: Pt bathes lower body with Min A for buttock cleansing due to decreased standing balance/tolerance, increased effort for reaching bilateral lower legs, would benefit from long-handled sponge. LB garment threading with supervision + unilateral support on WC, A for hiking over bottom/hips. Setup/supervision for UB care. Time dedicated to reviewing home-measurement sheet,  verifying potential need for TTB if able to progress to bathing at shower-level.   Pt remained sitting in WC with 4Ps assessed and immediate needs met. Pt continues to be appropriate for skilled OT intervention to promote further functional independence in ADLs/IADLs.   Session 2: Pt greeted sitting in recliner for skilled OT session with focus on functional transfers, standing balance, and activity tolerance.   Pain: Pt with un-rated pain in R-knee, OT offering intermediate rest breaks and positioning suggestions throughout session to address pain/fatigue and maximize participation/safety in session.   Functional Transfers: Pt performs STS from recliner with Mod A + HDRW, ambulating transfer from recliner>EOB with CGA + HDRW (cuing for RW proximity). Multiple STS from EOB at decreasing heights, Min-Mod A (at slightly higher height than WC/recliner). No instances of knee buckling this session. Sit>supine + boosting in bed with supervision and use of bed features.  Therapeutic Activities: In standing, pt performs 2 x 10 reps of alternating punches, initially alternating support on HDRW fading to no UE support, CGA throughout activity.   Pt remained resting in bed with 4Ps assessed and immediate needs met. Pt continues to be appropriate for skilled OT intervention to promote further functional independence in ADLs/IADLs.   Therapy Documentation Precautions:  Precautions Precautions: Fall Precaution Comments: L hemi, Restrictions Weight Bearing Restrictions: No   Therapy/Group: Individual Therapy  Lou Cal, OTR/L, MSOT  07/04/2023, 5:29 AM

## 2023-07-04 NOTE — Progress Notes (Signed)
REDS Clip  READING (normal 20-35%) = 38  CHEST RULER (in) =45 Clip Station =  D  Dr. Mayford Knife notified of REDS reading.   Rhae Hammock, BSN, Scientist, clinical (histocompatibility and immunogenetics) Only

## 2023-07-04 NOTE — Progress Notes (Signed)
Physical Therapy Session Note  Patient Details  Name: Lawrence [name] MRN: [id number] Date of Birth: Oct 28, 1955  Today's Date: 07/04/2023 PT Individual Time: 1032-1129 PT Individual Time Calculation (min): 57 min   Short Term Goals: Week 1:  PT Short Term Goal 1 (Week 1): pt will perform sit to stand with LRAD and min A PT Short Term Goal 2 (Week 1): Pt will perform bed to chair transfer with LRAD and min A PT Short Term Goal 3 (Week 1): pt will ambulate 20 feet with LRAD and mod A or less  Skilled Therapeutic Interventions/Progress Updates:      Pt seated in WC upon arrival. Pt agreeable to therapy. Pt reports 4/10 R knee pain, therapist donned R knee brace, and provided rest breaks and repositioning as needed. Pt on 2L O2 throughout session 94-98%. HR 67-86 until pt perfomred rolling R and L, dropped to 46-then to 39 , notified nursing.    Pt wife present and brought home measurement sheet. Pt wife reports he has a rollator, hospital bed, electric scooter for community (does not fit in house), BSC, lift chair.   OT reports pt attempted sit to stand from elevated hospital bed but pt buckled 2/2 fatigue. Therapist opted to utilize steady for safety as 2nd person unavailable.   Pt performed sit to stand with steady with min-mod A.   Pt performed mini squats to steady seat 1x3, 1x5, 1x6,   verbal and tactile cues provided for technique.   Cardiac present to perform a REDS during pt rest break.   Therapist assessed pt bed mobility, pt performed sit to supine and rolling B with supervision with use of bed rails. Pt HR dropped to 46 on pulse ox, pt performed supine to sit with supervision. Pt seated EOB with supervision, with pt intemittently closing eyes, and pt reporting "negligible chest pain" Notified nursing, RN and PA present during session. Manual HR assessed and 55, PA cleared pt to continue therapy.   Pt seated EOB with nurse in room at end of session, recommended nurse utilize  steady for transfer back to bed.            Therapy Documentation Precautions:  Precautions Precautions: Fall Precaution Comments: L hemi, Restrictions Weight Bearing Restrictions: No  Therapy/Group: Individual Therapy  [facility] Ambrose Finland, [place], DPT  07/04/2023, 7:46 AM

## 2023-07-05 DIAGNOSIS — I63542 Cerebral infarction due to unspecified occlusion or stenosis of left cerebellar artery: Secondary | ICD-10-CM | POA: Diagnosis not present

## 2023-07-05 DIAGNOSIS — E876 Hypokalemia: Secondary | ICD-10-CM | POA: Diagnosis not present

## 2023-07-05 DIAGNOSIS — G629 Polyneuropathy, unspecified: Secondary | ICD-10-CM | POA: Diagnosis not present

## 2023-07-05 DIAGNOSIS — E1169 Type 2 diabetes mellitus with other specified complication: Secondary | ICD-10-CM | POA: Diagnosis not present

## 2023-07-05 LAB — BASIC METABOLIC PANEL
Anion gap: 14 (ref 5–15)
BUN: 25 mg/dL — ABNORMAL HIGH (ref 8–23)
CO2: 28 mmol/L (ref 22–32)
Calcium: 8.9 mg/dL (ref 8.9–10.3)
Chloride: 98 mmol/L (ref 98–111)
Creatinine, Ser: 1.44 mg/dL — ABNORMAL HIGH (ref 0.61–1.24)
GFR, Estimated: 54 mL/min — ABNORMAL LOW (ref >60)
Glucose, Bld: 100 mg/dL — ABNORMAL HIGH (ref 70–99)
Potassium: 4.1 mmol/L (ref 3.5–5.1)
Sodium: 140 mmol/L (ref 135–145)

## 2023-07-05 LAB — GLUCOSE, CAPILLARY
Glucose-Capillary: 95 mg/dL (ref 70–99)
Glucose-Capillary: 83 mg/dL (ref 70–99)
Glucose-Capillary: 111 mg/dL — ABNORMAL HIGH (ref 70–99)
Glucose-Capillary: 81 mg/dL (ref 70–99)

## 2023-07-05 MED ORDER — TORSEMIDE 20 MG PO TABS
120.0000 mg | ORAL_TABLET | Freq: Two times a day (BID) | ORAL | Status: DC
  Administered 2023-07-06 – 2023-07-12 (×13): 120 mg via ORAL
  Filled 2023-07-05 (×13): qty 6

## 2023-07-05 MED ORDER — DICLOFENAC SODIUM 1 % EX GEL: 4.0000 g | Freq: Four times a day (QID) | CUTANEOUS | Status: DC

## 2023-07-05 MED ORDER — MUSCLE RUB 10-15 % EX CREA
TOPICAL_CREAM | CUTANEOUS | Status: DC | PRN
  Administered 2023-07-05: 1 via TOPICAL
  Filled 2023-07-05: qty 85

## 2023-07-05 MED ORDER — INSULIN GLARGINE-YFGN 100 UNIT/ML ~~LOC~~ SOLN
36.0000 [IU] | SUBCUTANEOUS | Status: DC
  Administered 2023-07-06: 36 [IU] via SUBCUTANEOUS
  Filled 2023-07-05: qty 0.36

## 2023-07-05 NOTE — Progress Notes (Signed)
Speech Language Pathology Weekly Progress and Session Note  Patient Details  Name: Lawrence [name] MRN: [id number] Date of Birth: 1956-04-28  Beginning of progress report period: June 29, 2023 End of progress report period: July 05, 2023  Today's Date: 07/05/2023  Short Term Goals: Week 1: SLP Short Term Goal 1 (Week 1): Patient will solve moderately complex problems with 90% acc given sup A SLP Short Term Goal 1 - Progress (Week 1): Met SLP Short Term Goal 2 (Week 1): Patient will recall novel information given a 10- 15 minute delay with 90% acc with sup A SLP Short Term Goal 2 - Progress (Week 1): Met SLP Short Term Goal 3 (Week 1): Patient will selectively attend to task in a mildly distracting environment for >25 minutes with sup A SLP Short Term Goal 3 - Progress (Week 1): Progressing toward goal SLP Short Term Goal 4 (Week 1): Patient will utilize word finding strategies at the sentence level with sup A SLP Short Term Goal 4 - Progress (Week 1): Progressing toward goal    New Short Term Goals: Week 2: SLP Short Term Goal 1 (Week 2): Patient will utilize word finding strategies at the sentence level with sup A SLP Short Term Goal 2 (Week 2): Patient will selectively attend to task in a mildly distracting environment for >25 minutes with sup A  Weekly Progress Updates: Patient has made steady gains and has met 2 of 4 STG's this reporting period due to improved recall and problem solving. Currently, pt continues to require sup to min A for attention and word finding. Pt/ family education is ongoing. Pt would benefit from continued skilled SLP intervention to maximize communication and cognition in order to maximize his functional independence prior to discharge.  Intensity: Minumum of 1-2 x/day, 30 to 90 minutes Frequency: 1 to 3 out of 7 days Duration/Length of Stay: 07/12/23 Treatment/Interventions: Cognitive remediation/compensation;Internal/external aids;Speech/Language  facilitation;Therapeutic Activities;Functional tasks;Patient/family education;Dysphagia/aspiration precaution training   Therapy/Group: Individual Therapy  Renaee Munda 07/05/2023, 4:35 PM

## 2023-07-05 NOTE — Progress Notes (Signed)
Patient ID: Lawrence [name], male   DOB: Jan 20, 1956, 67 y.o.   MRN: [id number]  Met with pt and his significant other who is here visiting to give team conference update regarding progress this week in therapies. He is having a wonderful day today and walked 120 ft with CGA level and PT therapist. He hopes this will continue. His goals are CGA now since upgraded and his target discharge date is 12/4. He is glad to be off the O2 now. Discussed having Janet-significant other come in for education and she reports she has been here and feels she knows how he is doing and how he moves. Discussed dressing changes and she reports [facility] and have in the past. Also discussed a tub bench and both report he will not use this they are [facility] for [facility] to [facility] a walk in shower. So will not order this. Discussed ambulance home and will set up for 11:00 on 12/4. Will let Center well know of discharge date and they will resume services. Work toward discharge next wednesday

## 2023-07-05 NOTE — Plan of Care (Signed)
  Problem: RH Tub/Shower Transfers Goal: LTG Patient will perform tub/shower transfers w/assist (OT) Description: LTG: Patient will perform tub/shower transfers with assist, with/without cues using equipment (OT) Outcome: Not Applicable (Discharge goal) Note: Goal no longer appropriate for DC; patient comfortable with sponge-bathing until bathroom is remodeled.

## 2023-07-05 NOTE — Plan of Care (Signed)
  Problem: Consults Goal: RH STROKE PATIENT EDUCATION Description: See Patient Education module for education specifics  Outcome: Progressing   Problem: RH BOWEL ELIMINATION Goal: RH STG MANAGE BOWEL WITH ASSISTANCE Description: STG Manage Bowel with min Assistance. Outcome: Progressing Goal: RH STG MANAGE BOWEL W/MEDICATION W/ASSISTANCE Description: STG Manage Bowel with Medication with min Assistance. Outcome: Progressing   Problem: RH BLADDER ELIMINATION Goal: RH STG MANAGE BLADDER WITH ASSISTANCE Description: STG Manage Bladder With min Assistance Outcome: Progressing   Problem: RH SKIN INTEGRITY Goal: RH STG SKIN FREE OF INFECTION/BREAKDOWN Description: Skin will remain intact and free of infection/breakdown with min assist  Outcome: Progressing Goal: RH STG MAINTAIN SKIN INTEGRITY WITH ASSISTANCE Description: STG Maintain Skin Integrity With min Assistance. Outcome: Progressing   Problem: RH SAFETY Goal: RH STG ADHERE TO SAFETY PRECAUTIONS W/ASSISTANCE/DEVICE Description: STG Adhere to Safety Precautions With min Assistance/Device. Outcome: Progressing   Problem: RH PAIN MANAGEMENT Goal: RH STG PAIN MANAGED AT OR BELOW PT'S PAIN GOAL Description: Less than 4 with PRN medications min assist  Outcome: Progressing   Problem: RH KNOWLEDGE DEFICIT Goal: RH STG INCREASE KNOWLEDGE OF HYPERTENSION Description: Patient/caregiver will be able to manage cardiac medications and self care from nursing education, nursing handouts, and other resources independently  Outcome: Progressing Goal: RH STG INCREASE KNOWLEGDE OF HYPERLIPIDEMIA Description: Patient/caregiver will be able to manage cholesterol medications and diet/lifestyle modifications to improve HDL of 30 and trig of 209 from nursing education and nursing handouts independently  Outcome: Progressing Goal: RH STG INCREASE KNOWLEDGE OF STROKE PROPHYLAXIS Description: Patient/caregiver will be able to manage Eliquis, self  care, and other medications to reduce the risk of stroke from nursing educations, nursing handouts, and other resources independently  Outcome: Progressing

## 2023-07-05 NOTE — Progress Notes (Signed)
PROGRESS NOTE   Subjective/Complaints: He is happy because he is off oxygen this AM! Pain is controlled this AM. Slept a little better last night.   ROS: Patient denies fever, chills, malaise, rash, sore throat, blurred vision, nausea, vomiting, diarrhea, cough or chest pain, headache, or mood change. + dizzy when standing-improved +SOB-improved + Peripheral neuropathy-improved   Objective:   No results found. Recent Labs    07/03/23 0614  WBC 9.6  HGB 14.4  HCT 43.8  PLT 309    Recent Labs    07/04/23 0516 07/05/23 0453  NA 136 140  K 3.9 4.1  CL 100 98  CO2 28 28  GLUCOSE 102* 100*  BUN 28* 25*  CREATININE 1.28* 1.44*  CALCIUM 9.0 8.9    Intake/Output Summary (Last 24 hours) at 07/05/2023 1025 Last data filed at 07/05/2023 1001 Gross per 24 hour  Intake 957 ml  Output 3600 ml  Net -2643 ml        Physical Exam: Vital Signs Blood pressure 137/81, pulse 64, temperature 97.6 F (36.4 C), temperature source Oral, resp. rate 18, height 6\' 2"  (1.88 m), weight (!) 153.3 kg, SpO2 95%.     General: , No apparent distress, morbidly obese.  Sitting in bed HEENT: Head is normocephalic, atraumatic,MMM neck: Supple.  No LAD Heart: Reg rate and rhythm.   Zio patch removed Chest: CTA bilaterally without wheezes, rales, or rhonchi; nonlabored breathing.  On  Room AIR Abdomen: Soft, mildly-distended, nontender, bowel sounds positive. Extremities: No clubbing, cyanosis, trace bilateral lower extremity edema-unchanged from prior exam.  Bilateral lower extremities wrapped  Psych: Appropriate mood and affect, cooperative Skin: Dry dressing in place to  b/l ankles, no apparent breakdown.   Neuro:  Mental Status: AAOx3, memory intact, fund of knowledge appropriate.  No apparent deficits. Cranial nerves: positive left trigeminal sensory deficit.   MOTOR: Moving all 4 extremities antigravity and against  resistance.  From prior exams: RUE: 5/5 Deltoid, 5/5 Biceps, 5/5 Triceps,5/5 Grip LUE: 4/5 Deltoid, 4/5 Biceps, 4/5 Triceps, 4/5 Grip RLE: HF 5/5, KE 5/5, ADF 5/5, APF 5/5 LLE: HF 1-2/5, KE 1/5, ADF 2/5, APF 2/5     No hypertonia noted    SENSORY: Altered sensation to light touch in left upper extremity and left lower extremity, as well as bilateral feet and all toes. Minimal tenderness b/l Feet and ankles    Assessment/Plan: 1. Functional deficits which require 3+ hours per day of interdisciplinary therapy in a comprehensive inpatient rehab setting. Physiatrist is providing close team supervision and 24 hour management of active medical problems listed below. Physiatrist and rehab team continue to assess barriers to discharge/monitor patient progress toward functional and medical goals  Care Tool:  Bathing    Body parts bathed by patient: Right arm, Left arm, Chest, Abdomen, Front perineal area, Face   Body parts bathed by helper: Right upper leg, Left upper leg, Right lower leg, Left lower leg     Bathing assist Assist Level: Maximal Assistance - Patient 24 - 49%     Upper Body Dressing/Undressing Upper body dressing   What is the patient wearing?: Pull over shirt    Upper body assist Assist  Level: Minimal Assistance - Patient > 75%    Lower Body Dressing/Undressing Lower body dressing      What is the patient wearing?: Pants     Lower body assist Assist for lower body dressing: Maximal Assistance - Patient 25 - 49%     Toileting Toileting    Toileting assist Assist for toileting: Maximal Assistance - Patient 25 - 49%     Transfers Chair/bed transfer  Transfers assist     Chair/bed transfer assist level: Minimal Assistance - Patient > 75%     Locomotion Ambulation   Ambulation assist   Ambulation activity did not occur: Safety/medical concerns  Assist level: Minimal Assistance - Patient > 75% Assistive device: Walker-rolling Max distance: 45    Walk 10 feet activity   Assist  Walk 10 feet activity did not occur: Safety/medical concerns  Assist level: Minimal Assistance - Patient > 75% Assistive device: Walker-rolling   Walk 50 feet activity   Assist Walk 50 feet with 2 turns activity did not occur: Safety/medical concerns         Walk 150 feet activity   Assist Walk 150 feet activity did not occur: Safety/medical concerns         Walk 10 feet on uneven surface  activity   Assist Walk 10 feet on uneven surfaces activity did not occur: Safety/medical concerns         Wheelchair     Assist Is the patient using a wheelchair?: Yes Type of Wheelchair: Manual    Wheelchair assist level: Dependent - Patient 0%      Wheelchair 50 feet with 2 turns activity    Assist        Assist Level: Dependent - Patient 0%   Wheelchair 150 feet activity     Assist      Assist Level: Dependent - Patient 0%   Blood pressure 137/81, pulse 64, temperature 97.6 F (36.4 C), temperature source Oral, resp. rate 18, height 6\' 2"  (1.88 m), weight (!) 153.3 kg, SpO2 95%.  Medical Problem List and Plan: 1. Functional deficits secondary to acute ischemic left cerebellar infarct with hx of prior CVA             -patient may shower, please cover RLE skin breakdown             -ELOS/Goals: 16 to 20 days, PT OT supervision to min assist, SLP supervision MRI             -Continue CIR  -Team conference today please see physician documentation under team conference tab, met with team  to discuss problems,progress, and goals. Formulized individual treatment plan based on medical history, underlying problem and comorbidities.    2.  Antithrombotics: -DVT/anticoagulation:  Pharmaceutical: Eliquis             -antiplatelet therapy: N/A 3. Chronic pain/Pain Management: Has spinal cord stimulator. Continue Tylenol prn.   --will d/c hydrocodone and monitor as denies pain at this time.  --continue robaxin prn  4.  Mood/Behavior/Sleep: LCSW to follow for evaluation and support.              -antipsychotic agents: Abilify-->will change to bedtime as at home.              --chronic insomnia was being managed with Trazodone and Remeron--> d/c per neurology --has PTSD/Nightmares but sleeping well in hospital. Resume Prasozin 2 mg/HS if BP stable.  -11/26 will restart trazodone at lower dose of 50 mg nightly -11/27  insomnia improved this AM 5. Neuropsych/cognition: This patient is capable of making decisions on his own behalf. 6. Skin/Wound Care: Routine pressure relief measures.              -Bilateral lower extremity venous stasis ulcers.  Continue current wound care with Aquacel and Xeroform 7. Fluids/Electrolytes/Nutrition: Monitor I/O. Check CMET in am. Recheck Mg and Phos level in am8.    8. HTN: BP stable. Resume Prazosin when able  --Wide complex tachycardia: No significant arrhthymias seen per Dr. Tenny Craw.              --metoprolol 12.5 mg bid and can be increased to 25 mg bid.   -11/22 metoprolol was discontinued due to bradycardia  -11-23: Normotensive, mild intermittent bradycardia, appreciate cardiology assistance and medication titration  -11/26 BP soft but stable continue current regimen  -Toprol-XL 25 mg daily per cardiology  -11/27 Controlled BP continue current regimen     07/05/2023    5:03 AM 07/04/2023    7:30 PM 07/04/2023    1:13 PM  Vitals with BMI  Weight 338 lbs    BMI 43.38    Systolic 137 136 99  Diastolic 81 78 68  Pulse 64 78 68     9. T2DM with peripheral neuropathy: Hgb A1C-6.5 and well controlled. Monitor BS ac/hs and use SSI for elevated BS.              --continue insulin glargine 40 units daily with novolog 12 units TID  -11/22 fair control, continue to monitor for now  11-27 well controlled decrease glargine to 36 units to reduce risk of hypoglycemia  CBG (last 3)  Recent Labs    07/04/23 1641 07/04/23 2048 07/05/23 0559  GLUCAP 98 141* 95    10.  Chronic diastolic CHF: Added 1500 cc FR per recommendations. HH diet  --Daily weight and monitor for signs of overload--weight at admission 11/16- 373 lbs.  --evaluated by cards today-->BB increased to 25 mg BID, Demadex bid. Zaroxolyn X 1 dose --cards will follow for response in am.   11/22 cardiology following-appreciate assistance, cardiology holding off on spironolactone due to dizziness 11-23: No new weights overnight, no apparent fluid overload, continue current regimen 11/24: weight uptrending mildly from 11-22, no external signs of worsening volume overload, chest x-ray clear.  Per cardiology, could consider IV Lasix if additional diuresis needed, will defer at this time given medication changes below. 11/26 per cardiology continue torsemide 120 mg twice daily 11/27 wt trending down, off O2 [place], continue current  Filed Weights   07/03/23 0500 07/04/23 0446 07/05/23 0503  Weight: (!) 158.3 kg (!) 156.4 kg (!) 153.3 kg    11. Wide complex tachycardia/hx PAF/bradycardia/PVCs: Started metoprolol 25mg  BID by cardiology. On Eliquis  -11/22 Zio started by cardiology, metoprolol was stopped.  Cardiology feels that pulse may be undercalled due to PVCs.  11-24: Per recommendation by EP, resumed metoprolol 12.5 mg twice daily despite ongoing bradycardia.  Nursing informed not to hold medication for heart rate.  Unfortunately, unable to interrogate Zio patch.  Appreciate cardiology input in this case.  -11/25 cardiology has seen him today, will monitor for recommendations, zio patch in place  12. Seizure d/o: Has been seizure free on Keppra and Vimpat -->followed by Dr. Teresa Coombs.  13. Major depressive: PTA--was on Effexor, Abilify, Remeron and Trazodone.              --lethargy could be due to daytime Abilify-->will change to nights             --  Remeron and trazodone on hold per neuro  -Continue melatonin 14. GAD w/panic attacks/PTSD: Chronic issues onoging and followed by [facility].  --Was on Prazosin  and Buspar.             --Mood stable on current dose Buspar. Monitor for now  15. Hypokalemia: Improved with supplementation.  -11/21 potassium lower today at 3.1, will change potassium supplement to 20 mEq twice daily.  Recheck tomorrow  -11/22 K+ up to 3.2, increase to in am and in evening  -11/22 potassium supplement increased to 40 mEq twice daily; repeat on Monday  -11/25 potassium up to 3.4 continue supplementation  11/21 up to 4.1 this AM, continue current regimen and monitor  16. Leucocytosis: WBC trending down-->monitor  -11/25 within normal limits 17. OSA: Now compliant with CPAP but has been requiring 5 L oxygen per [place].              --question OHS as contributing factor leading to hypersomnia. Wean oxygen as able.   -Appears to be tolerating weaning down nasal O2, he reports he used his CPAP at night  07-01-23: Compliant with CPAP, stable on 2 L nasal cannula  18. COPD: On Trilegy at home -->on Breo/Incruse  Continue to wean off oxygen.   19. Neuropathy: Managed with spinal cord stimulator and Lyrica (Dr. Beverly Gust Shaw/[facility]).   -11-23: Patient complaining of worsening symptoms, especially at night.  However, appears already on max dose Lyrica and venlafaxine.  Defer further medication adjustments to primary team.   11/27 pain controlled this AM  20. Gout flare R great toe: On colchicine bid for 5 more days, resume allopurinol in 2 to 3 weeks  -11/25 colchicine was decreased to daily, discontinue after 1 more day 21. CKD 3A:BUN/Scr-38/1.31 at admission.   -Creatinine a little higher today at 1.41/BUN 32, patient is on diuretics per cardiology  -11/26 torsemide was held yesterday, creatinine down to 1.28  -11/27 hold torsemide this evening, Cr up to 1.44 22. Morbid Obesity. Body mass index is 47.89 kg/m.             -Dietary counseling    LOS: 7 days A FACE TO FACE EVALUATION WAS PERFORMED  Fanny Dance 07/05/2023, 10:25 AM

## 2023-07-05 NOTE — Plan of Care (Signed)
  Problem: Consults Goal: RH STROKE PATIENT EDUCATION Description: See Patient Education module for education specifics  Outcome: Progressing   Problem: RH BOWEL ELIMINATION Goal: RH STG MANAGE BOWEL WITH ASSISTANCE Description: STG Manage Bowel with min Assistance. Outcome: Progressing Goal: RH STG MANAGE BOWEL W/MEDICATION W/ASSISTANCE Description: STG Manage Bowel with Medication with min Assistance. Outcome: Progressing   Problem: RH BLADDER ELIMINATION Goal: RH STG MANAGE BLADDER WITH ASSISTANCE Description: STG Manage Bladder With min Assistance Outcome: Progressing   Problem: RH SKIN INTEGRITY Goal: RH STG SKIN FREE OF INFECTION/BREAKDOWN Description: Skin will remain intact and free of infection/breakdown with min assist  Outcome: Progressing Goal: RH STG MAINTAIN SKIN INTEGRITY WITH ASSISTANCE Description: STG Maintain Skin Integrity With min Assistance. Outcome: Progressing   Problem: RH SAFETY Goal: RH STG ADHERE TO SAFETY PRECAUTIONS W/ASSISTANCE/DEVICE Description: STG Adhere to Safety Precautions With min Assistance/Device. Outcome: Progressing Goal: RH STG DECREASED RISK OF FALL WITH ASSISTANCE Description: STG Decreased Risk of Fall With min Assistance. Outcome: Progressing   Problem: RH PAIN MANAGEMENT Goal: RH STG PAIN MANAGED AT OR BELOW PT'S PAIN GOAL Description: Less than 4 with PRN medications min assist  Outcome: Progressing   Problem: RH KNOWLEDGE DEFICIT Goal: RH STG INCREASE KNOWLEDGE OF HYPERTENSION Description: Patient/caregiver will be able to manage cardiac medications and self care from nursing education, nursing handouts, and other resources independently  Outcome: Progressing Goal: RH STG INCREASE KNOWLEGDE OF HYPERLIPIDEMIA Description: Patient/caregiver will be able to manage cholesterol medications and diet/lifestyle modifications to improve HDL of 30 and trig of 209 from nursing education and nursing handouts independently  Outcome:  Progressing Goal: RH STG INCREASE KNOWLEDGE OF STROKE PROPHYLAXIS Description: Patient/caregiver will be able to manage Eliquis, self care, and other medications to reduce the risk of stroke from nursing educations, nursing handouts, and other resources independently  Outcome: Progressing   Problem: Education: Goal: Ability to describe self-care measures that may prevent or decrease complications (Diabetes Survival Skills Education) will improve Outcome: Progressing Goal: Individualized Educational Video(s) Outcome: Progressing   Problem: Coping: Goal: Ability to adjust to condition or change in health will improve Outcome: Progressing   Problem: Fluid Volume: Goal: Ability to maintain a balanced intake and output will improve Outcome: Progressing   Problem: Health Behavior/Discharge Planning: Goal: Ability to identify and utilize available resources and services will improve Outcome: Progressing Goal: Ability to manage health-related needs will improve Outcome: Progressing   Problem: Metabolic: Goal: Ability to maintain appropriate glucose levels will improve Outcome: Progressing   Problem: Nutritional: Goal: Maintenance of adequate nutrition will improve Outcome: Progressing Goal: Progress toward achieving an optimal weight will improve Outcome: Progressing   Problem: Skin Integrity: Goal: Risk for impaired skin integrity will decrease Outcome: Progressing   Problem: Tissue Perfusion: Goal: Adequacy of tissue perfusion will improve Outcome: Progressing

## 2023-07-05 NOTE — Patient Care Conference (Signed)
Inpatient RehabilitationTeam Conference and Plan of Care Update Date: 07/05/2023   Time: 12:01 PM    Patient Name: Lawrence [name]      Medical Record Number: [id number]  Date of Birth: 1955-10-15 Sex: Male         Room/Bed: 4M01C/4M01C-01 Payor Info: Payor: [employer] / Plan: [facility] / Product Type: *No Product type* /    Admit Date/Time:  06/28/2023  6:59 PM  Primary Diagnosis:  Acute stroke due to occlusion of left cerebellar artery [facility])  Hospital Problems: Principal Problem:   Acute stroke due to occlusion of left cerebellar artery (HCC) Active Problems:   Diabetes mellitus (HCC)   Primary hypertension   Type 2 diabetes mellitus with diabetic polyneuropathy, with long-term current use of insulin (HCC)   Stage 3a chronic kidney disease (HCC)   AKI (acute kidney injury) (HCC)   Orthostatic hypotension   Polyneuropathy   Cognitive and neurobehavioral dysfunction    Expected Discharge Date: Expected Discharge Date: 07/12/23  Team Members Present: Physician leading conference: Dr. Fanny Dance Social Worker Present: Dossie Der, LCSW Nurse Present: Chana Bode, RN PT Present: Ambrose Finland, PT OT Present: Lou Cal, OT SLP Present: Pablo Lawrence, SLP PPS Coordinator present : Fae Pippin, SLP     Current Status/Progress Goal Weekly Team Focus  Bowel/Bladder   pt continent of b/b   Remain continent   Assist with toileting    Swallow/Nutrition/ Hydration               ADL's   Setup/supervision for UB care; Min A for LB care (decreased standing balance/tolerance); Mod A for toileting   Min A LB, S UB, CGA bathroom transfers   Functional transfers, AE appropriateness, and activity tolerance with O2 titration    Mobility   bed mobility with supervision with bed features, sit to stand and stand pivot transfer with RW/Rollator and CGA from elevated surface, and min A from [facility], rollator seat and recliner, ambulation  x123 feet with RW and Rollator with CGA and R knee brace   mod I/CGA  D/C 12/4, pt will need ambulance transfer home    Communication                Safety/Cognition/ Behavioral Observations  Min deficits in awareness, memory, problem solving, and attention   mod I   higher level functional task (med management and financial management), attention, and awareness    Pain   pt c/o bilateral lower leg pain/muscle spasms, prn meds given   <3 pain score   Assess pain    Skin   LLL ulcer, healed RLL ulcer, see wound orders   Promote healing, maintain skin integrity  Assess qshift and prn      Discharge Planning:  Home with significant other who is there but not able to provide much physical assist. Has aide from [place] for four hours four days per week. Has all needed equipment from past admissions   Team Discussion: Patient post left cerebellar CVA with encephalopathy. Holding diuretics; monitoring creatine, off supplemental oxygen and holding O2 sats WNL.  Neuropathic pain addressed.  Patient on target to meet rehab goals: yes, currently needs set up for upper body care and min assist for lower body and mod assist for toileting. (Sponge bathed at baseline) Needs supervision - min assist for cognition; SLP signed off on services. Able to ambulate up to 123' using a rollator with CGA - min assist as standing tolerance improved and pain addressed.  Goals for discharge set for min assist overall.  *See Care Plan and progress notes for long and short-term goals.   Revisions to Treatment Plan:  N/a   Teaching Needs: Safety, skin care, medications, dietary modifications, transfers, toileting, etc.   Current Barriers to Discharge: Decreased caregiver support, Home enviroment access/layout, and Wound care  Possible Resolutions to Barriers: Family education DME: RW Ambulance non emergent transportation     Medical Summary Current Status: CVA, COPD, insomnia, HTN, DM2, Neuropathic  pain, CHF, low potassium, CKD 3A  Barriers to Discharge: Oxygen Requirement;Medical stability;Renal Insufficiency/Failure;Uncontrolled Pain;Cardiac Complications  Barriers to Discharge Comments: CVA, COPD, insomnia, HTN, DM2, Neuropathic pain, CHF, low potassium, CKD 3A Possible Resolutions to [employer] Focus: [place] O2 stopped, adjust insulin dose, hold diuretic tonight, monitor potassium   Continued Need for Acute Rehabilitation Level of Care: The patient requires daily medical management by a physician with specialized training in physical medicine and rehabilitation for the following reasons: Direction of a multidisciplinary physical rehabilitation program to maximize functional independence : Yes Medical management of patient stability for increased activity during participation in an intensive rehabilitation regime.: Yes Analysis of laboratory values and/or radiology reports with any subsequent need for medication adjustment and/or medical intervention. : Yes   I attest that I was present, lead the team conference, and concur with the assessment and plan of the team.   Chana Bode B 07/05/2023, 12:41 PM

## 2023-07-05 NOTE — Progress Notes (Signed)
Physical Therapy Weekly Progress Note  Patient Details  Name: Lawrence [name] MRN: [id number] Date of Birth: 1956/02/15  Beginning of progress report period: June 29, 2023 End of progress report period: July 05, 2023  Today's Date: 07/05/2023 PT Individual Time: 2536-6440, 3474-2595 PT Individual Time Calculation (min): 57 min, 58 min  Patient has met 1 of 3 short term goals.  Pt level of assist has fluctuated this week between min-max A for sit to stand, stand pivot transfer with RW 2/2 B LE buckling with power up. Pt demos improved consistency of sit to stand and stand pivot transfer with RW and Rollator with CGA from elevated surface and min A from WC, rollator, and recliner. Will continue to monitor consistency. Pt ambulating 123 feet with Rollator and RW.   Patient continues to demonstrate the following deficits muscle weakness, decreased cardiorespiratoy endurance, and decreased standing balance and decreased balance strategies and therefore will continue to benefit from skilled PT intervention to increase functional independence with mobility.  Patient progressing toward long term goals..  Continue plan of care.  PT Short Term Goals Week 1:  PT Short Term Goal 1 (Week 1): pt will perform sit to stand with LRAD and min A PT Short Term Goal 1 - Progress (Week 1): Met PT Short Term Goal 2 (Week 1): Pt will perform bed to chair transfer with LRAD and min A PT Short Term Goal 2 - Progress (Week 1): Met PT Short Term Goal 3 (Week 1): pt will ambulate 20 feet with LRAD and mod A or less PT Short Term Goal 3 - Progress (Week 1): Met Week 2:  PT Short Term Goal 1 (Week 2): STG=LTG 2/2 ELOS  Skilled Therapeutic Interventions/Progress Updates:      Pt seated in recliner upon arrival. Pt agreeable to thanksgiving. Pt reports 3/10 neuropathic pain in L LE. Therapist donned R LE knee brace with total A.   Pt on room air today, O2 93-97 throughout session. HR in the 70s throughout  session.   Pt performed stand pivot transfer with min A and increased time for power up, with therapist providing approximation to L LE, and CGA for pivot.   Pt performed sit to stand throughout session with CGA from elevated mat table, min A from WC, recliner, and rollator seat, verbal cues provided for technique.   Pt ambulated 123 feet with RW and CGA, verbal cues provided for glute activation and upright posture. Pt ambulated 123 feet with rollator and CGA. Pt reports preference for rollator. Will continue to practice transfers and gait with rollator as pt previously using it at baseline.   Pt seated in recliner with all needs within reach at end of session.   Treatment Session 2   Pt seated in recliner upon arrival. Pt agreeable to therapy. Pt reports 5/10 pain in R knee and L foot.   Pt on RA throughout session with O2 91-98 throughout session. Pt reports dizziness with seated exercises-BP assessed 146/80, HR 60  Pt reports fatigue during last ambulation with OT and that he should have stopped but kept pushing forward.   Education provided on energy conservation, safety, and recognizing signs and symptoms of fatigue,pt verbalized understanding and reports his indication of need to sit is increased trunk flexion.   Pt ambulated from room to main gym (total 123 feet) with 1 seated rest break on rollator, pt demos carry over of technique of parking rollator against the wall, pt requires CGA for stability, pt requires CGA/light  min A for power up, verbal cues provided for UE positioning on rollator for safety.   Pt ambulated 1x123 feet with rollator and CGA, verbal cues provided for upright posutre and posterior chain activation, and R hip and knee flexion versus circumduction for R LE advancement  Pt performed the following therex for B LE/posterior chain strengthening, verbal cues provided to reduce compensation and to perform within available range.    1x10 seated rows   1x10 seated  chin tucks  2x10 B LE LAQ   2x10 B LE seated alternating marching  2x10 B LE heel/toe raises  2x10 L LE quad sets holding for 3 seconds   2x10 glute sets holding for 3 seconds  2x10 long sitting hip abduction    Pt seated in recliner at end of session with all needs within reach and chair alarm on.       Therapy Documentation Precautions:  Precautions Precautions: Fall Precaution Comments: L hemi, Restrictions Weight Bearing Restrictions: No  Therapy/Group: Individual Therapy  [facility] Ambrose Finland, [place], DPT  07/05/2023, 7:46 AM

## 2023-07-05 NOTE — Progress Notes (Signed)
   07/05/23 1948  BiPAP/CPAP/SIPAP  BiPAP/CPAP/SIPAP Pt Type Adult  BiPAP/CPAP/SIPAP Resmed  Reason BIPAP/CPAP not in use  (pt has home CPAP, no assistance needed.)

## 2023-07-06 DIAGNOSIS — I5032 Chronic diastolic (congestive) heart failure: Secondary | ICD-10-CM | POA: Diagnosis not present

## 2023-07-06 DIAGNOSIS — E876 Hypokalemia: Secondary | ICD-10-CM | POA: Diagnosis not present

## 2023-07-06 DIAGNOSIS — I63542 Cerebral infarction due to unspecified occlusion or stenosis of left cerebellar artery: Secondary | ICD-10-CM | POA: Diagnosis not present

## 2023-07-06 DIAGNOSIS — E1169 Type 2 diabetes mellitus with other specified complication: Secondary | ICD-10-CM | POA: Diagnosis not present

## 2023-07-06 LAB — BASIC METABOLIC PANEL
Anion gap: 11 (ref 5–15)
BUN: 24 mg/dL — ABNORMAL HIGH (ref 8–23)
CO2: 22 mmol/L (ref 22–32)
Calcium: 9.1 mg/dL (ref 8.9–10.3)
Chloride: 99 mmol/L (ref 98–111)
Creatinine, Ser: 1.2 mg/dL (ref 0.61–1.24)
GFR, Estimated: >60 mL/min (ref >60)
Glucose, Bld: 102 mg/dL — ABNORMAL HIGH (ref 70–99)
Potassium: 4 mmol/L (ref 3.5–5.1)
Sodium: 132 mmol/L — ABNORMAL LOW (ref 135–145)

## 2023-07-06 LAB — CBC
HCT: 43.5 % (ref 39.0–52.0)
Hemoglobin: 14.2 g/dL (ref 13.0–17.0)
MCH: 29 pg (ref 26.0–34.0)
MCHC: 32.6 g/dL (ref 30.0–36.0)
MCV: 88.8 fL (ref 80.0–100.0)
Platelets: 272 10*3/uL (ref 150–400)
RBC: 4.9 MIL/uL (ref 4.22–5.81)
RDW: 15.5 % (ref 11.5–15.5)
WBC: 8.9 10*3/uL (ref 4.0–10.5)
nRBC: 0 % (ref 0.0–0.2)

## 2023-07-06 LAB — GLUCOSE, CAPILLARY
Glucose-Capillary: 97 mg/dL (ref 70–99)
Glucose-Capillary: 146 mg/dL — ABNORMAL HIGH (ref 70–99)
Glucose-Capillary: 136 mg/dL — ABNORMAL HIGH (ref 70–99)
Glucose-Capillary: 111 mg/dL — ABNORMAL HIGH (ref 70–99)

## 2023-07-06 MED ORDER — INSULIN ASPART 100 UNIT/ML IJ SOLN
10.0000 [IU] | Freq: Three times a day (TID) | INTRAMUSCULAR | Status: DC
  Administered 2023-07-06 – 2023-07-08 (×8): 10 [IU] via SUBCUTANEOUS

## 2023-07-06 MED ORDER — INSULIN GLARGINE-YFGN 100 UNIT/ML ~~LOC~~ SOLN
34.0000 [IU] | SUBCUTANEOUS | Status: DC
  Administered 2023-07-07 – 2023-07-11 (×5): 34 [IU] via SUBCUTANEOUS
  Filled 2023-07-06 (×5): qty 0.34

## 2023-07-06 MED ORDER — MELATONIN 5 MG PO TABS
10.0000 mg | ORAL_TABLET | Freq: Every evening | ORAL | Status: DC | PRN
  Administered 2023-07-07: 10 mg via ORAL
  Filled 2023-07-06 (×2): qty 2

## 2023-07-06 NOTE — Progress Notes (Addendum)
PROGRESS NOTE   Subjective/Complaints: No acute events noted overnight.  Patient reports it took him a while to fall asleep last night.  No additional concerns.  LBM 11/26  ROS: Patient denies fever, chills, malaise, rash, sore throat, blurred vision, nausea, vomiting, diarrhea, cough or chest pain, headache, or mood change. + dizzy when standing-improved +SOB-improved + Peripheral neuropathy-pain is improved today + Insomnia chronic   Objective:   No results found. Recent Labs    07/06/23 0602  WBC 8.9  HGB 14.2  HCT 43.5  PLT 272    Recent Labs    07/05/23 0453 07/06/23 0602  NA 140 132*  K 4.1 4.0  CL 98 99  CO2 28 22  GLUCOSE 100* 102*  BUN 25* 24*  CREATININE 1.44* 1.20  CALCIUM 8.9 9.1    Intake/Output Summary (Last 24 hours) at 07/06/2023 1112 Last data filed at 07/06/2023 1046 Gross per 24 hour  Intake 1440 ml  Output 3400 ml  Net -1960 ml        Physical Exam: Vital Signs Blood pressure 105/64, pulse 68, temperature 97.6 F (36.4 C), resp. rate 18, height 6\' 2"  (1.88 m), weight (!) 155.2 kg, SpO2 96%.     General: , No apparent distress, morbidly obese.  Sitting in bedside chair HEENT: Head is normocephalic, atraumatic,MMM neck: Supple.  No LAD Heart: Reg rate and rhythm.  Chest: CTA bilaterally without wheezes, rales, or rhonchi; nonlabored breathing.  On  Room AIR Abdomen: Soft, mildly-distended, nontender, bowel sounds positive. Extremities: No clubbing, cyanosis, trace bilateral lower extremity edema-unchanged from prior exam.  Bilateral lower extremities wrapped  Psych: Appropriate mood and affect, cooperative Skin: Dry dressing in place to  b/l ankles, no apparent breakdown.   Neuro:  Mental Status: AAOx3, memory intact, fund of knowledge appropriate.  No apparent deficits. Cranial nerves: positive left trigeminal sensory deficit.   MOTOR: Moving all 4  extremities    From prior exams: RUE: 5/5 Deltoid, 5/5 Biceps, 5/5 Triceps,5/5 Grip LUE: 4/5 Deltoid, 4/5 Biceps, 4/5 Triceps, 4/5 Grip RLE: HF 5/5, KE 5/5, ADF 5/5, APF 5/5 LLE: HF 1-2/5, KE 1/5, ADF 2/5, APF 2/5     No hypertonia noted    SENSORY: Altered sensation to light touch in left upper extremity and left lower extremity, as well as bilateral feet and all toes. Minimal tenderness b/l Feet and ankles    Assessment/Plan: 1. Functional deficits which require 3+ hours per day of interdisciplinary therapy in a comprehensive inpatient rehab setting. Physiatrist is providing close team supervision and 24 hour management of active medical problems listed below. Physiatrist and rehab team continue to assess barriers to discharge/monitor patient progress toward functional and medical goals  Care Tool:  Bathing    Body parts bathed by patient: Right arm, Left arm, Chest, Abdomen, Front perineal area, Face, Right upper leg, Left upper leg, Right lower leg, Left lower leg   Body parts bathed by helper: Buttocks     Bathing assist Assist Level: Minimal Assistance - Patient > 75%     Upper Body Dressing/Undressing Upper body dressing   What is the patient wearing?: Pull over shirt    Upper body assist  Assist Level: Set up assist    Lower Body Dressing/Undressing Lower body dressing      What is the patient wearing?: Underwear/pull up (Shorts)     Lower body assist Assist for lower body dressing: Minimal Assistance - Patient > 75%     Toileting Toileting    Toileting assist Assist for toileting: Maximal Assistance - Patient 25 - 49%     Transfers Chair/bed transfer  Transfers assist     Chair/bed transfer assist level: Minimal Assistance - Patient > 75%     Locomotion Ambulation   Ambulation assist   Ambulation activity did not occur: Safety/medical concerns  Assist level: Contact Guard/Touching assist Assistive device: Rollator Max distance: 123  feet   Walk 10 feet activity   Assist  Walk 10 feet activity did not occur: Safety/medical concerns  Assist level: Contact Guard/Touching assist Assistive device: Rollator   Walk 50 feet activity   Assist Walk 50 feet with 2 turns activity did not occur: Safety/medical concerns  Assist level: Contact Guard/Touching assist Assistive device: Rollator    Walk 150 feet activity   Assist Walk 150 feet activity did not occur: Safety/medical concerns         Walk 10 feet on uneven surface  activity   Assist Walk 10 feet on uneven surfaces activity did not occur: Safety/medical concerns         Wheelchair     Assist Is the patient using a wheelchair?: Yes Type of Wheelchair: Manual    Wheelchair assist level: Independent      Wheelchair 50 feet with 2 turns activity    Assist        Assist Level: Independent   Wheelchair 150 feet activity     Assist      Assist Level: Independent   Blood pressure 105/64, pulse 68, temperature 97.6 F (36.4 C), resp. rate 18, height 6\' 2"  (1.88 m), weight (!) 155.2 kg, SpO2 96%.  Medical Problem List and Plan: 1. Functional deficits secondary to acute ischemic left cerebellar infarct with hx of prior CVA             -patient may shower, please cover RLE skin breakdown             -ELOS/Goals: 16 to 20 days, PT OT supervision to min assist, SLP supervision MRI             -Continue CIR  -Expected discharge 12/4  2.  Antithrombotics: -DVT/anticoagulation:  Pharmaceutical: Eliquis             -antiplatelet therapy: N/A 3. Chronic pain/Pain Management: Has spinal cord stimulator. Continue Tylenol prn.   --will d/c hydrocodone and monitor as denies pain at this time.  --continue robaxin prn  -11/29 Knee brace reordered 4. Mood/Behavior/Sleep: LCSW to follow for evaluation and support.              -antipsychotic agents: Abilify-->will change to bedtime as at home.              --chronic insomnia was being  managed with Trazodone and Remeron--> d/c per neurology --has PTSD/Nightmares but sleeping well in hospital. Resume Prasozin 2 mg/HS if BP stable.  -11/26 will restart trazodone at lower dose of 50 mg nightly -11/28 increase melatonin to 10 mg 5. Neuropsych/cognition: This patient is capable of making decisions on his own behalf. 6. Skin/Wound Care: Routine pressure relief measures.              -Bilateral lower extremity  venous stasis ulcers.  Continue current wound care with Aquacel and Xeroform 7. Fluids/Electrolytes/Nutrition: Monitor I/O. Check CMET in am. Recheck Mg and Phos level in am8.    8. HTN: BP stable. Resume Prazosin when able  --Wide complex tachycardia: No significant arrhthymias seen per Dr. Tenny Craw.              --metoprolol 12.5 mg bid and can be increased to 25 mg bid.   -11/22 metoprolol was discontinued due to bradycardia  -11-23: Normotensive, mild intermittent bradycardia, appreciate cardiology assistance and medication titration  -11/26 BP soft but stable continue current regimen  -Toprol-XL 25 mg daily per cardiology  -11/28 Controlled BP continue current regimen     07/06/2023    6:00 AM 07/06/2023    5:05 AM 07/05/2023    7:14 PM  Vitals with BMI  Weight 342 lbs 2 oz    BMI 43.9    Systolic  105 132  Diastolic  64 74  Pulse  68 75     9. T2DM with peripheral neuropathy: Hgb A1C-6.5 and well controlled. Monitor BS ac/hs and use SSI for elevated BS.              --continue insulin glargine 40 units daily with novolog 12 units TID  -11/22 fair control, continue to monitor for now  11-27 well controlled decrease glargine to 36 units to reduce risk of hypoglycemia  11/28 decrease NovoLog to 10 units 3 times daily and glargine to 34 units to avoid risk of hypoglycemia  CBG (last 3)  Recent Labs    07/05/23 1608 07/05/23 2043 07/06/23 0557  GLUCAP 111* 81 97    10. Chronic diastolic CHF: Added 1500 cc FR per recommendations. HH diet  --Daily weight  and monitor for signs of overload--weight at admission 11/16- 373 lbs.  --evaluated by cards today-->BB increased to 25 mg BID, Demadex bid. Zaroxolyn X 1 dose --cards will follow for response in am.   11/22 cardiology following-appreciate assistance, cardiology holding off on spironolactone due to dizziness 11-23: No new weights overnight, no apparent fluid overload, continue current regimen 11/24: weight uptrending mildly from 11-22, no external signs of worsening volume overload, chest x-ray clear.  Per cardiology, could consider IV Lasix if additional diuresis needed, will defer at this time given medication changes below. 11/26 per cardiology continue torsemide 120 mg twice daily 11/27 wt trending down, off O2 [place], continue current 11/28 weight appears overall stable, tolerating room air continue to monitor  Filed Weights   07/04/23 0446 07/05/23 0503 07/06/23 0600  Weight: (!) 156.4 kg (!) 153.3 kg (!) 155.2 kg    11. Wide complex tachycardia/hx PAF/bradycardia/PVCs: Started metoprolol 25mg  BID by cardiology. On Eliquis  -11/22 Zio started by cardiology, metoprolol was stopped.  Cardiology feels that pulse may be undercalled due to PVCs.  11-24: Per recommendation by EP, resumed metoprolol 12.5 mg twice daily despite ongoing bradycardia.  Nursing informed not to hold medication for heart rate.  Unfortunately, unable to interrogate Zio patch.  Appreciate cardiology input in this case.  -11/25 cardiology has seen him today, will monitor for recommendations, zio patch in place  12. Seizure d/o: Has been seizure free on Keppra and Vimpat -->followed by Dr. Teresa Coombs.  13. Major depressive: PTA--was on Effexor, Abilify, Remeron and Trazodone.              --lethargy could be due to daytime Abilify-->will change to nights             --  Remeron and trazodone on hold per neuro  -Continue melatonin 14. GAD w/panic attacks/PTSD: Chronic issues onoging and followed by [facility].  --Was on Prazosin and  Buspar.             --Mood stable on current dose Buspar. Monitor for now  15. Hypokalemia: Improved with supplementation.  -11/21 potassium lower today at 3.1, will change potassium supplement to 20 mEq twice daily.  Recheck tomorrow  -11/22 K+ up to 3.2, increase to in am and in evening  -11/22 potassium supplement increased to 40 mEq twice daily; repeat on Monday  -11/25 potassium up to 3.4 continue supplementation  11/21 up to 4.1 this AM, continue current regimen and monitor   11/22 potassium stable at 4.0  16. Leucocytosis: WBC trending down-->monitor  -11/25 within normal limits 17. OSA: Now compliant with CPAP but has been requiring 5 L oxygen per .              --question OHS as contributing factor leading to hypersomnia. Wean oxygen as able.   -Appears to be tolerating weaning down nasal O2, he reports he used his CPAP at night  07-01-23: Compliant with CPAP, stable on 2 L nasal cannula  11/28 compliant with CPAP  18. COPD: On Trilegy at home -->on Breo/Incruse  Continue to wean off oxygen.   19. Neuropathy: Managed with spinal cord stimulator and Lyrica (Dr. Beverly Gust Shaw/[facility]).   -11-23: Patient complaining of worsening symptoms, especially at night.  However, appears already on max dose Lyrica and venlafaxine.  Defer further medication adjustments to primary team.   11/27 pain controlled this AM  20. Gout flare R great toe: On colchicine bid for 5 more days, resume allopurinol in 2 to 3 weeks  -11/25 colchicine was decreased to daily, discontinue after 1 more day 21. CKD 3A:BUN/Scr-38/1.31 at admission.   -Creatinine a little higher today at 1.41/BUN 32, patient is on diuretics per cardiology  -11/26 torsemide was held yesterday, creatinine down to 1.28  -11/27 hold torsemide this evening, Cr up to 1.44  11/28 creatinine improved to 1.2, BUN 24.  As torsemide 120 mg twice daily  22. Morbid Obesity. Body mass index is 47.89 kg/m.             -Dietary  counseling    LOS: 8 days A FACE TO FACE EVALUATION WAS PERFORMED  Fanny Dance 07/06/2023, 11:12 AM

## 2023-07-07 DIAGNOSIS — E876 Hypokalemia: Secondary | ICD-10-CM | POA: Diagnosis not present

## 2023-07-07 DIAGNOSIS — E1169 Type 2 diabetes mellitus with other specified complication: Secondary | ICD-10-CM | POA: Diagnosis not present

## 2023-07-07 DIAGNOSIS — I63542 Cerebral infarction due to unspecified occlusion or stenosis of left cerebellar artery: Secondary | ICD-10-CM | POA: Diagnosis not present

## 2023-07-07 DIAGNOSIS — I5032 Chronic diastolic (congestive) heart failure: Secondary | ICD-10-CM | POA: Diagnosis not present

## 2023-07-07 LAB — GLUCOSE, CAPILLARY
Glucose-Capillary: 102 mg/dL — ABNORMAL HIGH (ref 70–99)
Glucose-Capillary: 100 mg/dL — ABNORMAL HIGH (ref 70–99)
Glucose-Capillary: 88 mg/dL (ref 70–99)
Glucose-Capillary: 119 mg/dL — ABNORMAL HIGH (ref 70–99)

## 2023-07-07 LAB — BASIC METABOLIC PANEL
Anion gap: 9 (ref 5–15)
BUN: 24 mg/dL — ABNORMAL HIGH (ref 8–23)
CO2: 27 mmol/L (ref 22–32)
Calcium: 8.8 mg/dL — ABNORMAL LOW (ref 8.9–10.3)
Chloride: 102 mmol/L (ref 98–111)
Creatinine, Ser: 1.21 mg/dL (ref 0.61–1.24)
GFR, Estimated: >60 mL/min (ref >60)
Glucose, Bld: 102 mg/dL — ABNORMAL HIGH (ref 70–99)
Potassium: 4.1 mmol/L (ref 3.5–5.1)
Sodium: 138 mmol/L (ref 135–145)

## 2023-07-07 NOTE — Progress Notes (Signed)
Physical Therapy Session Note  Patient Details  Name: Lawrence [name] MRN: [id number] Date of Birth: 1955-11-01  Today's Date: 07/07/2023 PT Individual Time: [phone] and [id number] PT Individual Time Calculation (min): 60 min and 59 min   Short Term Goals: Week 2:  PT Short Term Goal 1 (Week 2): STG=LTG 2/2 ELOS  Skilled Therapeutic Interventions/Progress Updates:    Session 1: Pt received sitting in recliner and agreeable to therapy session. Donned R knee sleeve (pt unaware of proper orientation of sleeve). Pt reports he plans to wear his sandals at home so donned them for therapy session.   Planned to use rollator during session to continue assessing safety of this AD vs RW.  Vitals during session on RA: SpO2 94-96% and HR 73-88bpm  Pt reports his goal is to be able to walk the 116ft breezeway in his apartment complex using his rollator.  Sit>stand recliner>rollator with heavy min A for lifting to stand and therapist providing facilitation at L knee for approximation and activation of quads.  Gait training ~167ft to main therapy gym using rollator with CGA for safety and 1x heavy min A due to L knee buckling during gait from toe catching and pt having to use B UE support on AD with the assistance of therapist to remain standing and recover to upright. Pt demonstrating the following gait deviations with therapist providing the described cuing and facilitation for improvement:  - slow gait speed - severe knee genu recurvatum bilaterally (R worse than L) with bias towards lateral side - R LE adducts too far toward midline - longer step length on L compared to R - heavy reliance on B UE support on AD throughout gait   Repeated sit<>stands to/from slightly elevated EOM<>rollator with light min A for lifting x8reps - pt has to use compensatory technique as described due to BLE weakness  - when coming to stand, has to first extend B knees and then performs hip extension once B UE  support on AD - when going to sit, places both hands back and keeps knees extended until performing hip flexion to rest backs of legs against seat prior to flexing knees to rest down on chair  Seated L LE long arc quads x10reps with active assist due to pt lacking ability to achieve ~20degrees of terminal knee extension due to paresis.   Standing B UE support on rollator performed L LE stance phase hip/knee extensor activation while avoiding locking knee into hyperextension via R LE marches x5reps increased to x10reps - min A for balance and therapist guarding L knee from hyperextension and buckling while providing tactile faciltiation for quad activation. Pt relies heavily on B UE support on AD to maintain upright. Cuing throughout to increase hip extension for improved upright posture. Pt reports really enjoying this activity and that his knee feels better when he is activating his muscles.  Gait training ~123ft back to room using rollator as described above but without any knee buckling. Pt left seated in recliner with needs in reach and chair alarm on.   Session 2: Pt received sitting in recliner and is agreeable to therapy session. Already wearing R knee sleeve. Sit>stand recliner>rollator requiring 2x attempts to rise due to poor powering up the first time - light min A for lifting - continues to have impaired sequencing of biomechanics while rising to stand as described above.  Gait training ~169ft to ortho gym using rollator with CGA for steadying. Pt demonstrating the following gait deviations with therapist  providing the described cuing and facilitation for improvement:  - continues with above gait deviations, but pt reports he is trying not to rely on snapping L knee back into hyperextension for stability during stance, possible slight visible improvement on L side compared to earlier but difficult to tell - no instances of knee buckling during gait this afternoon  HR 78bpm and SpO2 97% on  RA  B LE strengthening and reciprocal movement pattern training on Nustep against level 4-5 resistance for totaling 195steps and averaging 30-40steps per minute. Therapist providing music during to promote increased speed of movement for cardiovascular endurance training. Pt reports some, unrated, R knee pain during this, but dissipates afterwards with rest break.  Pt reports he enjoys exercising in the pool - educated pt on recommendation to start with HHPT, but the option to advance to aquatic OPPT if/when appropriate - asked SW to provide pt with this information.  Sitting EOM, B LE long arc quads 2x10reps - continues to lack ~20degrees of terminal knee extension on L side.  Standing B UE support on rollator with mirror feedback performed x8reps glute squeezes with focus on upright posture.  Advanced to standing with unilateral UE support on rollator while grasping horseshoes from the top of mirror 2x  4 reps per UE - therapist providing facilitation at L knee to avoid hyperextension and guard for L buckle with focus on L glute and quad activation during stance and decreased reliance on bony structural support.  Gait training back to room as described above using rollator. Sit>supine with light min A for placing B LEs into bed and using bedrail support. Pt left supported upright in bed in the care of NT.   Therapy Documentation Precautions:  Precautions Precautions: Fall Precaution Comments: L hemi, Restrictions Weight Bearing Restrictions: No   Pain:  Session 1: Reports pain is "not too bad" today in his knee.  Session 2: Continues to report pain is "not too bad," but did increase with use of Nustep due to pt needing to have more space to have his hip externally rotated due to join malalignment. Pain dissipates with rest break.    Therapy/Group: Individual Therapy  Ginny Forth , PT, DPT, NCS, CSRS 07/07/2023, 7:49 AM

## 2023-07-07 NOTE — Progress Notes (Signed)
Orthopedic Tech Progress Note Patient Details:  Lawrence [name] Nov 11, 1955 [id number]  Patient ID: Lawrence [name], male   DOB: March 08, 1956, 67 y.o.   MRN: [id number]  Lawrence [name] 07/07/2023, 10:21 PM Called in order to [employer] for a NEOPRENE HINGED KNEE BRACE

## 2023-07-07 NOTE — Progress Notes (Signed)
   07/06/23 2300  BiPAP/CPAP/SIPAP  BiPAP/CPAP/SIPAP Pt Type Adult  Patient Home Equipment Yes  Safety Check Completed by RT for Home Unit Yes, no issues noted   Pt placed self on home CPAP. No issues noted at this time.

## 2023-07-07 NOTE — Progress Notes (Signed)
Occupational Therapy Session Note  Patient Details  Name: Lawrence [name] MRN: [id number] Date of Birth: [birth year]  Today's Date: 07/07/2023 OT Individual Time: 1301-1411 OT Individual Time Calculation (min): 70 min    Short Term Goals: Week 2:  OT Short Term Goal 1 (Week 2): STGs=LTGs due to patient's estimated length of stay.  Skilled Therapeutic Interventions/Progress Updates:   Pt open to all skilled OT services offered this session. This OT was IE clinician and pt has made significant gains since. Pt up in recliner and was able to amb from recliner all the way to main gym 154 ft with min A-CGA to power up to stand to rollator and CGA with w/c follow with min cues to widen BOS x 2 instances. Pt then able to complete 2 coordination activities for L UE NMRE. 1st pipe tree with 16 components and minor increased time but 100% accuracy and integration.  Pt also able to perform small thumbtack design push pin activity with 3 color alternating pins retrieved from small container. Increased time needed and 95% accuracy and L hand consistency. OT transported pt w/c level back to room for energy conservation due to long last PT session. Pt independent after set up to complete oral care seated then min A-CGA for rollator transfer 5 ft back into recliner. Pt elevated LE's and left with all needs and chair alarm active.    Pain: 6/10 B LE's due to neuropathy with relief with elevation and oral and topical meds   Therapy Documentation Precautions:  Precautions Precautions: Fall Precaution Comments: L hemi, Restrictions Weight Bearing Restrictions: No  Therapy/Group: Individual Therapy  Vicenta Dunning 07/07/2023, 12:59 PM

## 2023-07-07 NOTE — Plan of Care (Signed)
  Problem: Consults Goal: RH STROKE PATIENT EDUCATION Description: See Patient Education module for education specifics  Outcome: Progressing   Problem: RH BOWEL ELIMINATION Goal: RH STG MANAGE BOWEL WITH ASSISTANCE Description: STG Manage Bowel with min Assistance. Outcome: Progressing Goal: RH STG MANAGE BOWEL W/MEDICATION W/ASSISTANCE Description: STG Manage Bowel with Medication with min Assistance. Outcome: Progressing   Problem: RH BLADDER ELIMINATION Goal: RH STG MANAGE BLADDER WITH ASSISTANCE Description: STG Manage Bladder With min Assistance Outcome: Progressing   Problem: RH SKIN INTEGRITY Goal: RH STG SKIN FREE OF INFECTION/BREAKDOWN Description: Skin will remain intact and free of infection/breakdown with min assist  Outcome: Progressing Goal: RH STG MAINTAIN SKIN INTEGRITY WITH ASSISTANCE Description: STG Maintain Skin Integrity With min Assistance. Outcome: Progressing   Problem: RH SAFETY Goal: RH STG ADHERE TO SAFETY PRECAUTIONS W/ASSISTANCE/DEVICE Description: STG Adhere to Safety Precautions With min Assistance/Device. Outcome: Progressing Goal: RH STG DECREASED RISK OF FALL WITH ASSISTANCE Description: STG Decreased Risk of Fall With min Assistance. Outcome: Progressing   Problem: RH PAIN MANAGEMENT Goal: RH STG PAIN MANAGED AT OR BELOW PT'S PAIN GOAL Description: Less than 4 with PRN medications min assist  Outcome: Progressing   Problem: RH KNOWLEDGE DEFICIT Goal: RH STG INCREASE KNOWLEDGE OF HYPERTENSION Description: Patient/caregiver will be able to manage cardiac medications and self care from nursing education, nursing handouts, and other resources independently  Outcome: Progressing Goal: RH STG INCREASE KNOWLEGDE OF HYPERLIPIDEMIA Description: Patient/caregiver will be able to manage cholesterol medications and diet/lifestyle modifications to improve HDL of 30 and trig of 209 from nursing education and nursing handouts independently  Outcome:  Progressing Goal: RH STG INCREASE KNOWLEDGE OF STROKE PROPHYLAXIS Description: Patient/caregiver will be able to manage Eliquis, self care, and other medications to reduce the risk of stroke from nursing educations, nursing handouts, and other resources independently  Outcome: Progressing   Problem: Education: Goal: Ability to describe self-care measures that may prevent or decrease complications (Diabetes Survival Skills Education) will improve Outcome: Progressing Goal: Individualized Educational Video(s) Outcome: Progressing   Problem: Coping: Goal: Ability to adjust to condition or change in health will improve Outcome: Progressing   Problem: Fluid Volume: Goal: Ability to maintain a balanced intake and output will improve Outcome: Progressing   Problem: Health Behavior/Discharge Planning: Goal: Ability to identify and utilize available resources and services will improve Outcome: Progressing Goal: Ability to manage health-related needs will improve Outcome: Progressing   Problem: Metabolic: Goal: Ability to maintain appropriate glucose levels will improve Outcome: Progressing   Problem: Nutritional: Goal: Maintenance of adequate nutrition will improve Outcome: Progressing Goal: Progress toward achieving an optimal weight will improve Outcome: Progressing   Problem: Skin Integrity: Goal: Risk for impaired skin integrity will decrease Outcome: Progressing   Problem: Tissue Perfusion: Goal: Adequacy of tissue perfusion will improve Outcome: Progressing

## 2023-07-07 NOTE — Progress Notes (Signed)
 Occupational Therapy Session Note  Patient Details  Name: Lawrence [name] MRN: [id number] Date of Birth: 09-23-1955  Today's Date: 07/08/2023 OT Individual Time: 0702-0800 OT Individual Time Calculation (min): 58 min    Short Term Goals: Week 2:  OT Short Term Goal 1 (Week 2): STGs=LTGs due to patient's estimated length of stay.  Skilled Therapeutic Interventions/Progress Updates:  Pt greeted resting in bed for skilled OT session with focus on BADL retraining.   Pain: Pt with no reports of pain, OT offering intermediate rest breaks and positioning suggestions throughout session to address pain/fatigue and maximize participation/safety in session.   Functional Transfers: STS from EOB with supervision + rollator x2 attempts due to first transfer of the day. Ambulating transfer into/out of  bathroom and onto BSC placed in shower-stall with same level of assistance.   Self Care Tasks: Bathing at shower-level seated on [facility] with setup/supervision, use of long-handled sponge for back, lateral leans for pericare (use of grab bar & cuing). UB/LB clothing management with setup/supervision. Sitting on rollator at sink-side, pt completes oral care and hair brushing with distant supervision.  Pt remained sitting in recliner with 4Ps assessed and immediate needs met. Pt continues to be appropriate for skilled OT intervention to promote further functional independence in ADLs/IADLs.   Therapy Documentation Precautions:  Precautions Precautions: Fall Precaution Comments: L hemi, Restrictions Weight Bearing Restrictions: No   Therapy/Group: Individual Therapy  Artemus Biles, OTR/L, MSOT  07/08/2023, 7:04 AM

## 2023-07-07 NOTE — Progress Notes (Signed)
PROGRESS NOTE   Subjective/Complaints: No acute events overnight noted.  Patient using his CPAP.  He reports his neoprene hinged knee brace hinges broken.  LBM 11/28  ROS: Patient denies fever, chills, malaise, rash, sore throat, blurred vision, nausea, vomiting, diarrhea, constipation cough or chest pain, headache, or mood change.  + Peripheral neuropathy-pain is improved today + Insomnia chronic-improved   Objective:   No results found. Recent Labs    07/06/23 0602  WBC 8.9  HGB 14.2  HCT 43.5  PLT 272    Recent Labs    07/06/23 0602 07/07/23 0412  NA 132* 138  K 4.0 4.1  CL 99 102  CO2 22 27  GLUCOSE 102* 102*  BUN 24* 24*  CREATININE 1.20 1.21  CALCIUM 9.1 8.8*    Intake/Output Summary (Last 24 hours) at 07/07/2023 0858 Last data filed at 07/07/2023 0814 Gross per 24 hour  Intake 480 ml  Output 2020 ml  Net -1540 ml        Physical Exam: Vital Signs Blood pressure 112/71, pulse 73, temperature 97.6 F (36.4 C), resp. rate 18, height 6\' 2"  (1.88 m), weight (!) 153.4 kg, SpO2 95%.     General: , No apparent distress, morbidly obese.  Sitting in bedside chair HEENT: Head is normocephalic, atraumatic,MMM neck: Supple.  No LAD Heart: Reg rate and rhythm.  Chest: CTA bilaterally without wheezes, rales, or rhonchi; nonlabored breathing.  On  Room AIR Abdomen: Soft, mildly-distended, nontender, bowel sounds positive. Extremities: No clubbing, cyanosis, trace bilateral lower extremity edema-unchanged from prior exam.  Bilateral lower extremities wrapped  Psych: Appropriate mood and affect, cooperative Skin: Dry dressing in place to  b/l ankles, no apparent breakdown.  Neuro:  Mental Status: AAOx3, memory intact, fund of knowledge appropriate.  No apparent deficits. Cranial nerves: positive left trigeminal sensory deficit.   MOTOR: Moving all 4 extremities Wearing hinged neoprene knee  brace   From prior exams: RUE: 5/5 Deltoid, 5/5 Biceps, 5/5 Triceps,5/5 Grip LUE: 4/5 Deltoid, 4/5 Biceps, 4/5 Triceps, 4/5 Grip RLE: HF 5/5, KE 5/5, ADF 5/5, APF 5/5 LLE: HF 1-2/5, KE 1/5, ADF 2/5, APF 2/5     No hypertonia noted    SENSORY: Altered sensation to light touch in left upper extremity and left lower extremity, as well as bilateral feet and all toes. Minimal tenderness b/l Feet and ankles    Assessment/Plan: 1. Functional deficits which require 3+ hours per day of interdisciplinary therapy in a comprehensive inpatient rehab setting. Physiatrist is providing close team supervision and 24 hour management of active medical problems listed below. Physiatrist and rehab team continue to assess barriers to discharge/monitor patient progress toward functional and medical goals  Care Tool:  Bathing    Body parts bathed by patient: Right arm, Left arm, Chest, Abdomen, Front perineal area, Face, Right upper leg, Left upper leg, Right lower leg, Left lower leg   Body parts bathed by helper: Buttocks     Bathing assist Assist Level: Minimal Assistance - Patient > 75%     Upper Body Dressing/Undressing Upper body dressing   What is the patient wearing?: Pull over shirt    Upper body assist Assist Level:  Set up assist    Lower Body Dressing/Undressing Lower body dressing      What is the patient wearing?: Underwear/pull up (Shorts)     Lower body assist Assist for lower body dressing: Minimal Assistance - Patient > 75%     Toileting Toileting    Toileting assist Assist for toileting: Maximal Assistance - Patient 25 - 49%     Transfers Chair/bed transfer  Transfers assist     Chair/bed transfer assist level: Minimal Assistance - Patient > 75%     Locomotion Ambulation   Ambulation assist   Ambulation activity did not occur: Safety/medical concerns  Assist level: Contact Guard/Touching assist Assistive device: Rollator Max distance: 123 feet    Walk 10 feet activity   Assist  Walk 10 feet activity did not occur: Safety/medical concerns  Assist level: Contact Guard/Touching assist Assistive device: Rollator   Walk 50 feet activity   Assist Walk 50 feet with 2 turns activity did not occur: Safety/medical concerns  Assist level: Contact Guard/Touching assist Assistive device: Rollator    Walk 150 feet activity   Assist Walk 150 feet activity did not occur: Safety/medical concerns         Walk 10 feet on uneven surface  activity   Assist Walk 10 feet on uneven surfaces activity did not occur: Safety/medical concerns         Wheelchair     Assist Is the patient using a wheelchair?: Yes Type of Wheelchair: Manual    Wheelchair assist level: Independent      Wheelchair 50 feet with 2 turns activity    Assist        Assist Level: Independent   Wheelchair 150 feet activity     Assist      Assist Level: Independent   Blood pressure 112/71, pulse 73, temperature 97.6 F (36.4 C), resp. rate 18, height 6\' 2"  (1.88 m), weight (!) 153.4 kg, SpO2 95%.  Medical Problem List and Plan: 1. Functional deficits secondary to acute ischemic left cerebellar infarct with hx of prior CVA             -patient may shower, please cover RLE skin breakdown             -ELOS/Goals: 16 to 20 days, PT OT supervision to min assist, SLP supervision MRI             -Continue CIR  -Expected discharge 12/4  2.  Antithrombotics: -DVT/anticoagulation:  Pharmaceutical: Eliquis             -antiplatelet therapy: N/A 3. Chronic pain/Pain Management: Has spinal cord stimulator. Continue Tylenol prn.   --will d/c hydrocodone and monitor as denies pain at this time.  --continue robaxin prn  4. Mood/Behavior/Sleep: LCSW to follow for evaluation and support.              -antipsychotic agents: Abilify-->will change to bedtime as at home.              --chronic insomnia was being managed with Trazodone and  Remeron--> d/c per neurology --has PTSD/Nightmares but sleeping well in hospital. Resume Prasozin 2 mg/HS if BP stable.  -11/26 will restart trazodone at lower dose of 50 mg nightly -11/28 increase melatonin to 10 mg  -11/29 improved continue current regimen 5. Neuropsych/cognition: This patient is capable of making decisions on his own behalf. 6. Skin/Wound Care: Routine pressure relief measures.              -Bilateral lower extremity  venous stasis ulcers.  Continue current wound care with Aquacel and Xeroform 7. Fluids/Electrolytes/Nutrition: Monitor I/O. Check CMET in am. Recheck Mg and Phos level in am8.    8. HTN: BP stable. Resume Prazosin when able  --Wide complex tachycardia: No significant arrhthymias seen per Dr. Tenny Craw.              --metoprolol 12.5 mg bid and can be increased to 25 mg bid.   -11/22 metoprolol was discontinued due to bradycardia  -11-23: Normotensive, mild intermittent bradycardia, appreciate cardiology assistance and medication titration  -11/26 BP soft but stable continue current regimen  -Toprol-XL 25 mg daily per cardiology  -11/29 BP controlled continue current     07/07/2023    7:49 AM 07/07/2023    5:00 AM 07/07/2023    4:55 AM  Vitals with BMI  Weight  338 lbs 3 oz   BMI  43.4   Systolic 112  101  Diastolic 71  63  Pulse 73  70     9. T2DM with peripheral neuropathy: Hgb A1C-6.5 and well controlled. Monitor BS ac/hs and use SSI for elevated BS.              --continue insulin glargine 40 units daily with novolog 12 units TID  -11/22 fair control, continue to monitor for now  11-27 well controlled decrease glargine to 36 units to reduce risk of hypoglycemia  11/28 decrease NovoLog to 10 units 3 times daily and glargine to 34 units to avoid risk of hypoglycemia  11/29 well-controlled continue current regimen and monitor  CBG (last 3)  Recent Labs    07/06/23 1524 07/06/23 2059 07/07/23 0557  GLUCAP 136* 111* 102*    10. Chronic  diastolic CHF: Added 1500 cc FR per recommendations. HH diet  --Daily weight and monitor for signs of overload--weight at admission 11/16- 373 lbs.  --evaluated by cards today-->BB increased to 25 mg BID, Demadex bid. Zaroxolyn X 1 dose --cards will follow for response in am.   11/22 cardiology following-appreciate assistance, cardiology holding off on spironolactone due to dizziness 11-23: No new weights overnight, no apparent fluid overload, continue current regimen 11/24: weight uptrending mildly from 11-22, no external signs of worsening volume overload, chest x-ray clear.  Per cardiology, could consider IV Lasix if additional diuresis needed, will defer at this time given medication changes below. 11/26 per cardiology continue torsemide 120 mg twice daily 11/27 wt trending down, off O2 [place], continue current 11/28 weight appears overall stable, tolerating room air continue to monitor 11/29 wt stable, continue current medication, stable on RA  Filed Weights   07/05/23 0503 07/06/23 0600 07/07/23 0500  Weight: (!) 153.3 kg (!) 155.2 kg (!) 153.4 kg    11. Wide complex tachycardia/hx PAF/bradycardia/PVCs: Started metoprolol 25mg  BID by cardiology. On Eliquis  -11/22 Zio started by cardiology, metoprolol was stopped.  Cardiology feels that pulse may be undercalled due to PVCs.  11-24: Per recommendation by EP, resumed metoprolol 12.5 mg twice daily despite ongoing bradycardia.  Nursing informed not to hold medication for heart rate.  Unfortunately, unable to interrogate Zio patch.  Appreciate cardiology input in this case.  -11/25 cardiology has seen him today, will monitor for recommendations, zio patch in place  12. Seizure d/o: Has been seizure free on Keppra and Vimpat -->followed by Dr. Teresa Coombs.  13. Major depressive: PTA--was on Effexor, Abilify, Remeron and Trazodone.              --lethargy could be due to  daytime Abilify-->will change to nights             --Remeron and trazodone on  hold per neuro  -Continue melatonin 14. GAD w/panic attacks/PTSD: Chronic issues onoging and followed by [facility].  --Was on Prazosin and Buspar.             --Mood stable on current dose Buspar. Monitor for now  15. Hypokalemia: Improved with supplementation.  -11/21 potassium lower today at 3.1, will change potassium supplement to 20 mEq twice daily.  Recheck tomorrow  -11/22 K+ up to 3.2, increase to in am and in evening  -11/22 potassium supplement increased to 40 mEq twice daily; repeat on Monday  -11/25 potassium up to 3.4 continue supplementation  11/21 up to 4.1 this AM, continue current regimen and monitor   11/29 K+ stable at 4.1  16. Leucocytosis: WBC trending down-->monitor  -11/25 within normal limits 17. OSA: Now compliant with CPAP but has been requiring 5 L oxygen per [place].              --question OHS as contributing factor leading to hypersomnia. Wean oxygen as able.   -Appears to be tolerating weaning down nasal O2, he reports he used his CPAP at night  07-01-23: Compliant with CPAP, stable on 2 L nasal cannula  11/28 compliant with CPAP  18. COPD: On Trilegy at home -->on Breo/Incruse  Continue to wean off oxygen.   19. Neuropathy: Managed with spinal cord stimulator and Lyrica (Dr. Beverly Gust Shaw/[facility]).   -11-23: Patient complaining of worsening symptoms, especially at night.  However, appears already on max dose Lyrica and venlafaxine.  Defer further medication adjustments to primary team.   11/27 pain controlled this AM  20. Gout flare R great toe: On colchicine bid for 5 more days, resume allopurinol in 2 to 3 weeks  -11/25 colchicine was decreased to daily, discontinue after 1 more day 21. CKD 3A:BUN/Scr-38/1.31 at admission.   -Creatinine a little higher today at 1.41/BUN 32, patient is on diuretics per cardiology  -11/26 torsemide was held yesterday, creatinine down to 1.28  -11/27 hold torsemide this evening, Cr up to 1.44  11/28 creatinine improved to 1.2,  BUN 24.  As torsemide 120 mg twice daily  11/29 Cr stable at 1.21/bun 24 22. Morbid Obesity. Body mass index is 47.89 kg/m.             -Dietary counseling    LOS: 9 days A FACE TO FACE EVALUATION WAS PERFORMED  Fanny Dance 07/07/2023, 8:58 AM

## 2023-07-08 DIAGNOSIS — F5101 Primary insomnia: Secondary | ICD-10-CM

## 2023-07-08 DIAGNOSIS — I10 Essential (primary) hypertension: Secondary | ICD-10-CM | POA: Diagnosis not present

## 2023-07-08 DIAGNOSIS — I63542 Cerebral infarction due to unspecified occlusion or stenosis of left cerebellar artery: Secondary | ICD-10-CM | POA: Diagnosis not present

## 2023-07-08 DIAGNOSIS — E1169 Type 2 diabetes mellitus with other specified complication: Secondary | ICD-10-CM | POA: Diagnosis not present

## 2023-07-08 DIAGNOSIS — M792 Neuralgia and neuritis, unspecified: Secondary | ICD-10-CM

## 2023-07-08 DIAGNOSIS — E876 Hypokalemia: Secondary | ICD-10-CM | POA: Diagnosis not present

## 2023-07-08 LAB — GLUCOSE, CAPILLARY
Glucose-Capillary: 117 mg/dL — ABNORMAL HIGH (ref 70–99)
Glucose-Capillary: 105 mg/dL — ABNORMAL HIGH (ref 70–99)
Glucose-Capillary: 120 mg/dL — ABNORMAL HIGH (ref 70–99)
Glucose-Capillary: 127 mg/dL — ABNORMAL HIGH (ref 70–99)

## 2023-07-08 LAB — CBC
HCT: 43.3 % (ref 39.0–52.0)
Hemoglobin: 14.1 g/dL (ref 13.0–17.0)
MCH: 29.2 pg (ref 26.0–34.0)
MCHC: 32.6 g/dL (ref 30.0–36.0)
MCV: 89.6 fL (ref 80.0–100.0)
Platelets: 247 10*3/uL (ref 150–400)
RBC: 4.83 MIL/uL (ref 4.22–5.81)
RDW: 15.2 % (ref 11.5–15.5)
WBC: 9.6 10*3/uL (ref 4.0–10.5)
nRBC: 0 % (ref 0.0–0.2)

## 2023-07-08 MED ORDER — NITROGLYCERIN 0.4 MG SL SUBL
0.4000 mg | SUBLINGUAL_TABLET | SUBLINGUAL | Status: DC | PRN
Start: 2023-07-08 — End: 2023-07-12
  Administered 2023-07-08 (×2): 0.4 mg via SUBLINGUAL
  Filled 2023-07-08: qty 1

## 2023-07-08 MED ORDER — INSULIN ASPART 100 UNIT/ML IJ SOLN
8.0000 [IU] | Freq: Three times a day (TID) | INTRAMUSCULAR | Status: DC
  Administered 2023-07-09 – 2023-07-12 (×10): 8 [IU] via SUBCUTANEOUS

## 2023-07-08 NOTE — Plan of Care (Signed)
  Problem: Consults Goal: RH STROKE PATIENT EDUCATION Description: See Patient Education module for education specifics  Outcome: Progressing   Problem: RH BOWEL ELIMINATION Goal: RH STG MANAGE BOWEL WITH ASSISTANCE Description: STG Manage Bowel with min Assistance. Outcome: Progressing Goal: RH STG MANAGE BOWEL W/MEDICATION W/ASSISTANCE Description: STG Manage Bowel with Medication with min Assistance. Outcome: Progressing   Problem: RH BLADDER ELIMINATION Goal: RH STG MANAGE BLADDER WITH ASSISTANCE Description: STG Manage Bladder With min Assistance Outcome: Progressing   Problem: RH SKIN INTEGRITY Goal: RH STG SKIN FREE OF INFECTION/BREAKDOWN Description: Skin will remain intact and free of infection/breakdown with min assist  Outcome: Progressing Goal: RH STG MAINTAIN SKIN INTEGRITY WITH ASSISTANCE Description: STG Maintain Skin Integrity With min Assistance. Outcome: Progressing   Problem: RH SAFETY Goal: RH STG ADHERE TO SAFETY PRECAUTIONS W/ASSISTANCE/DEVICE Description: STG Adhere to Safety Precautions With min Assistance/Device. Outcome: Progressing Goal: RH STG DECREASED RISK OF FALL WITH ASSISTANCE Description: STG Decreased Risk of Fall With min Assistance. Outcome: Progressing   Problem: RH PAIN MANAGEMENT Goal: RH STG PAIN MANAGED AT OR BELOW PT'S PAIN GOAL Description: Less than 4 with PRN medications min assist  Outcome: Progressing   Problem: RH KNOWLEDGE DEFICIT Goal: RH STG INCREASE KNOWLEDGE OF HYPERTENSION Description: Patient/caregiver will be able to manage cardiac medications and self care from nursing education, nursing handouts, and other resources independently  Outcome: Progressing Goal: RH STG INCREASE KNOWLEGDE OF HYPERLIPIDEMIA Description: Patient/caregiver will be able to manage cholesterol medications and diet/lifestyle modifications to improve HDL of 30 and trig of 209 from nursing education and nursing handouts independently  Outcome:  Progressing Goal: RH STG INCREASE KNOWLEDGE OF STROKE PROPHYLAXIS Description: Patient/caregiver will be able to manage Eliquis, self care, and other medications to reduce the risk of stroke from nursing educations, nursing handouts, and other resources independently  Outcome: Progressing   Problem: Education: Goal: Ability to describe self-care measures that may prevent or decrease complications (Diabetes Survival Skills Education) will improve Outcome: Progressing Goal: Individualized Educational Video(s) Outcome: Progressing   Problem: Coping: Goal: Ability to adjust to condition or change in health will improve Outcome: Progressing   Problem: Fluid Volume: Goal: Ability to maintain a balanced intake and output will improve Outcome: Progressing   Problem: Health Behavior/Discharge Planning: Goal: Ability to identify and utilize available resources and services will improve Outcome: Progressing Goal: Ability to manage health-related needs will improve Outcome: Progressing   Problem: Metabolic: Goal: Ability to maintain appropriate glucose levels will improve Outcome: Progressing   Problem: Nutritional: Goal: Maintenance of adequate nutrition will improve Outcome: Progressing Goal: Progress toward achieving an optimal weight will improve Outcome: Progressing   Problem: Skin Integrity: Goal: Risk for impaired skin integrity will decrease Outcome: Progressing   Problem: Tissue Perfusion: Goal: Adequacy of tissue perfusion will improve Outcome: Progressing

## 2023-07-08 NOTE — Progress Notes (Signed)
Physical Therapy Session Note  Patient Details  Name: Lawrence [name] MRN: [id number] Date of Birth: 1955-09-23  Today's Date: 07/08/2023 PT Individual Time: 0924-1036 PT Individual Time Calculation (min): 72 min   Short Term Goals: Week 2:  PT Short Term Goal 1 (Week 2): STG=LTG 2/2 ELOS  Skilled Therapeutic Interventions/Progress Updates: Patient sitting in recliner with wife present on entrance to room. Patient alert and agreeable to PT session.   Patient reported unrated pain in R medial around arch (medical staff already aware) with rest breaks provided as needed.   Therapeutic Activity: Transfers: Pt performed sit<>stand transfers throughout session with min/heavy minA to rollator.   Gait Training:  Pt ambulated from room<>ortho gym using rollator with CGA/light  minA for safety. Pt demonstrated the following gait deviations with therapist providing the described cuing and facilitation for improvement:  - Genu recurvatum B (pt reports pain when first standing  - Forward flexed posture to the L more than the R - Decreased cadence and step clearance B LE's Pt ambulated short distance in ortho gym in rollator with primary focus on maintaining upright posture with soft bend in B knees (following NMRE). Pt with noted decrease in report of feelings of B knees bucking (hard to assess visually due to this problem being chronic). Pt then with short rest break prior to ambulating back to room in rollator with CGA. Pt provided self with recalled cues to stand upright (pt previously educated that forward flexed posture can increase hyperextension in knees).   Neuromuscular Re-ed: NMR facilitated during session with focus on neuromuscular control of B quadriceps. - Standing TKE with PTA pulling LE in knee flexion in order to work on eccentric control of quadriceps. Pt performed until close to fatigue (2 rounds) on B LE's. Pt and pt's wife further educated on rational. Pt required rest breaks  throughout due to fatigue and B knee pain (R>L). Pt also with VC to extend through hips due to forward flexed posture to the L   NMR performed for improvements in motor control and coordination, balance, sequencing, judgement, and self confidence/ efficacy in performing all aspects of mobility at highest level of independence.   Patient sitting in recliner at end of session with brakes locked, wife present, chair alarm set, and all needs within reach.      Therapy Documentation Precautions:  Precautions Precautions: Fall Precaution Comments: L hemi, Restrictions Weight Bearing Restrictions: No  Therapy/Group: Individual Therapy  Damante Spragg PTA 07/08/2023, 12:23 PM

## 2023-07-08 NOTE — Significant Event (Signed)
Rapid Response Event Note   Reason for Call :  CP 5/6  Initial Focused Assessment:  Pt lying in bed with eyes closed. He is visibly uncomfortable and mildly SOB. His hand is over his chest. He is c/o 6/10 cramping nonradiating pain over his L chest. He says he had some pain in his chest yesterday that went away on its own but this CP feels different now.  Lung are diminished t/o. Skin is warm and dry.   T-97.6, HR-75, BP-110/65, RR-21, SpO2-97% on RA  Interventions:  EKG-SR with 1st degree AV block NTG 0.48mcg SL x 2 CBC/BMP/Trop Plan of Care:  Chest pain relieved after 2 doses SL NTG. Await troponin results. Continue to monitor pt. Please call RRT if further assistance needed.   Event Summary:   MD Notified: [place], [place] Call [phone] Arrival [phone] End Time:2305  Terrilyn Saver, RN

## 2023-07-08 NOTE — Progress Notes (Signed)
PROGRESS NOTE   Subjective/Complaints: No new concerns today. Reports he slept "like a rock". Foot pain improved with use of muscle relaxer.   LBM 11/28  ROS: Patient denies fever, chills,  rash, sore throat, blurred vision, nausea, vomiting, diarrhea, constipation cough or chest pain, headache, or mood change. Denies new motor or sensory change.   + Peripheral neuropathy - controlled + Insomnia chronic-improved   Objective:   No results found. Recent Labs    07/06/23 0602  WBC 8.9  HGB 14.2  HCT 43.5  PLT 272    Recent Labs    07/06/23 0602 07/07/23 0412  NA 132* 138  K 4.0 4.1  CL 99 102  CO2 22 27  GLUCOSE 102* 102*  BUN 24* 24*  CREATININE 1.20 1.21  CALCIUM 9.1 8.8*    Intake/Output Summary (Last 24 hours) at 07/08/2023 1821 Last data filed at 07/08/2023 1735 Gross per 24 hour  Intake 1610 ml  Output 2850 ml  Net -1240 ml        Physical Exam: Vital Signs Blood pressure 107/70, pulse 77, temperature (!) 97.5 F (36.4 C), temperature source Oral, resp. rate 18, height 6\' 2"  (1.88 m), weight (!) 153.4 kg, SpO2 92%.     General: , No apparent distress, morbidly obese.  Sitting in bedside chair HEENT: Head is normocephalic, atraumatic,MMM neck: Supple.  No LAD Heart: Reg rate and rhythm.  No MRG Chest: CTA bilaterally without wheezes, rales, or rhonchi; nonlabored breathing.  On  Room AIR Abdomen: Soft, mildly-distended, nontender, bowel sounds positive. Extremities: No clubbing, cyanosis, trace bilateral lower extremity edema-unchanged from prior exam.  Bilateral lower extremities wrapped  Psych: Appropriate mood and affect, cooperative Skin: Dry dressing in place to  b/l ankles  Neuro:  Mental Status: AAOx3, memory intact, fund of knowledge appropriate.  No apparent deficits. Cranial nerves: positive left trigeminal sensory deficit.   MOTOR: Moving all 4 extremities   From prior  exams: RUE: 5/5 Deltoid, 5/5 Biceps, 5/5 Triceps,5/5 Grip LUE: 4/5 Deltoid, 4/5 Biceps, 4/5 Triceps, 4/5 Grip RLE: HF 5/5, KE 5/5, ADF 5/5, APF 5/5 LLE: HF 1-2/5, KE 1/5, ADF 2/5, APF 2/5     No hypertonia noted    SENSORY: Altered sensation to light touch in left upper extremity and left lower extremity, as well as bilateral feet and all toes. Minimal tenderness b/l Feet and ankles    Assessment/Plan: 1. Functional deficits which require 3+ hours per day of interdisciplinary therapy in a comprehensive inpatient rehab setting. Physiatrist is providing close team supervision and 24 hour management of active medical problems listed below. Physiatrist and rehab team continue to assess barriers to discharge/monitor patient progress toward functional and medical goals  Care Tool:  Bathing    Body parts bathed by patient: Right arm, Left arm, Chest, Abdomen, Front perineal area, Face, Right upper leg, Left upper leg, Right lower leg, Left lower leg, Buttocks   Body parts bathed by helper: Buttocks     Bathing assist Assist Level: Supervision/Verbal cueing     Upper Body Dressing/Undressing Upper body dressing   What is the patient wearing?: Pull over shirt    Upper body assist Assist Level:  Set up assist    Lower Body Dressing/Undressing Lower body dressing      What is the patient wearing?: Underwear/pull up (Shorts)     Lower body assist Assist for lower body dressing: Minimal Assistance - Patient > 75%     Toileting Toileting    Toileting assist Assist for toileting: Maximal Assistance - Patient 25 - 49%     Transfers Chair/bed transfer  Transfers assist     Chair/bed transfer assist level: Minimal Assistance - Patient > 75%     Locomotion Ambulation   Ambulation assist   Ambulation activity did not occur: Safety/medical concerns  Assist level: Contact Guard/Touching assist Assistive device: Rollator Max distance: 123 feet   Walk 10 feet  activity   Assist  Walk 10 feet activity did not occur: Safety/medical concerns  Assist level: Contact Guard/Touching assist Assistive device: Rollator   Walk 50 feet activity   Assist Walk 50 feet with 2 turns activity did not occur: Safety/medical concerns  Assist level: Contact Guard/Touching assist Assistive device: Rollator    Walk 150 feet activity   Assist Walk 150 feet activity did not occur: Safety/medical concerns         Walk 10 feet on uneven surface  activity   Assist Walk 10 feet on uneven surfaces activity did not occur: Safety/medical concerns         Wheelchair     Assist Is the patient using a wheelchair?: Yes Type of Wheelchair: Manual    Wheelchair assist level: Independent      Wheelchair 50 feet with 2 turns activity    Assist        Assist Level: Independent   Wheelchair 150 feet activity     Assist      Assist Level: Independent   Blood pressure 107/70, pulse 77, temperature (!) 97.5 F (36.4 C), temperature source Oral, resp. rate 18, height 6\' 2"  (1.88 m), weight (!) 153.4 kg, SpO2 92%.  Medical Problem List and Plan: 1. Functional deficits secondary to acute ischemic left cerebellar infarct with hx of prior CVA             -patient may shower, please cover RLE skin breakdown             -ELOS/Goals: 16 to 20 days, PT OT supervision to min assist, SLP supervision MRI             -Continue CIR  -Expected discharge 12/4  2.  Antithrombotics: -DVT/anticoagulation:  Pharmaceutical: Eliquis             -antiplatelet therapy: N/A 3. Chronic pain/Pain Management: Has spinal cord stimulator. Continue Tylenol prn.   --will d/c hydrocodone and monitor as denies pain at this time.  --continue robaxin prn  4. Mood/Behavior/Sleep: LCSW to follow for evaluation and support.              -antipsychotic agents: Abilify-->will change to bedtime as at home.              --chronic insomnia was being managed with  Trazodone and Remeron--> d/c per neurology --has PTSD/Nightmares but sleeping well in hospital. Resume Prasozin 2 mg/HS if BP stable.  -11/26 will restart trazodone at lower dose of 50 mg nightly -11/28 increase melatonin to 10 mg  -11/30 sleeping well, continue current regimen  5. Neuropsych/cognition: This patient is capable of making decisions on his own behalf. 6. Skin/Wound Care: Routine pressure relief measures.              -  Bilateral lower extremity venous stasis ulcers.  Continue current wound care with Aquacel and Xeroform 7. Fluids/Electrolytes/Nutrition: Monitor I/O. Check CMET in am. Recheck Mg and Phos level in am8.    8. HTN: BP stable. Resume Prazosin when able  --Wide complex tachycardia: No significant arrhthymias seen per Dr. Tenny Craw.              --metoprolol 12.5 mg bid and can be increased to 25 mg bid.   -11/22 metoprolol was discontinued due to bradycardia  -11-23: Normotensive, mild intermittent bradycardia, appreciate cardiology assistance and medication titration  -11/26 BP soft but stable continue current regimen  -Toprol-XL 25 mg daily per cardiology  -11/30 BP well controlled overall     07/08/2023   12:28 PM 07/08/2023    9:15 AM 07/08/2023    8:09 AM  Vitals with BMI  Systolic 107 124 829  Diastolic 70 73 59  Pulse 77 66 69     9. T2DM with peripheral neuropathy: Hgb A1C-6.5 and well controlled. Monitor BS ac/hs and use SSI for elevated BS.              --continue insulin glargine 40 units daily with novolog 12 units TID  -11/22 fair control, continue to monitor for now  11-27 well controlled decrease glargine to 36 units to reduce risk of hypoglycemia  11/28 decrease NovoLog to 10 units 3 times daily and glargine to 34 units to avoid risk of hypoglycemia  11/30 decrease Novolog to 8 units TID to avoid hypoglycemia  CBG (last 3)  Recent Labs    07/08/23 0620 07/08/23 1125 07/08/23 1629  GLUCAP 117* 105* 120*    10. Chronic diastolic CHF:  Added 1500 cc FR per recommendations. HH diet  --Daily weight and monitor for signs of overload--weight at admission 11/16- 373 lbs.  --evaluated by cards today-->BB increased to 25 mg BID, Demadex bid. Zaroxolyn X 1 dose --cards will follow for response in am.   11/22 cardiology following-appreciate assistance, cardiology holding off on spironolactone due to dizziness 11-23: No new weights overnight, no apparent fluid overload, continue current regimen 11/24: weight uptrending mildly from 11-22, no external signs of worsening volume overload, chest x-ray clear.  Per cardiology, could consider IV Lasix if additional diuresis needed, will defer at this time given medication changes below. 11/26 per cardiology continue torsemide 120 mg twice daily 11/27 wt trending down, off O2 [place], continue current 11/28 weight appears overall stable, tolerating room air continue to monitor 11/29 wt stable, continue current medication, stable on RA  Filed Weights   07/05/23 0503 07/06/23 0600 07/07/23 0500  Weight: (!) 153.3 kg (!) 155.2 kg (!) 153.4 kg    11. Wide complex tachycardia/hx PAF/bradycardia/PVCs: Started metoprolol 25mg  BID by cardiology. On Eliquis  -11/22 Zio started by cardiology, metoprolol was stopped.  Cardiology feels that pulse may be undercalled due to PVCs.  11-24: Per recommendation by EP, resumed metoprolol 12.5 mg twice daily despite ongoing bradycardia.  Nursing informed not to hold medication for heart rate.  Unfortunately, unable to interrogate Zio patch.  Appreciate cardiology input in this case.  -11/25 cardiology has seen him today, will monitor for recommendations, zio patch in place  12. Seizure d/o: Has been seizure free on Keppra and Vimpat -->followed by Dr. Teresa Coombs.  13. Major depressive: PTA--was on Effexor, Abilify, Remeron and Trazodone.              --lethargy could be due to daytime Abilify-->will change to nights             --  Remeron and trazodone on hold per  neuro  -Continue melatonin 14. GAD w/panic attacks/PTSD: Chronic issues onoging and followed by [facility].  --Was on Prazosin and Buspar.             --Mood stable on current dose Buspar. Monitor for now  15. Hypokalemia: Improved with supplementation.  -11/21 potassium lower today at 3.1, will change potassium supplement to 20 mEq twice daily.  Recheck tomorrow  -11/22 K+ up to 3.2, increase to in am and in evening  -11/22 potassium supplement increased to 40 mEq twice daily; repeat on Monday  -11/25 potassium up to 3.4 continue supplementation  11/21 up to 4.1 this AM, continue current regimen and monitor   11/29 K+ stable at 4.1  Recheck monday  16. Leucocytosis: WBC trending down-->monitor  -11/25 within normal limits 17. OSA: Now compliant with CPAP but has been requiring 5 L oxygen per [place].              --question OHS as contributing factor leading to hypersomnia. Wean oxygen as able.   -Appears to be tolerating weaning down nasal O2, he reports he used his CPAP at night  07-01-23: Compliant with CPAP, stable on 2 L nasal cannula  11/28 compliant with CPAP  18. COPD: On Trilegy at home -->on Breo/Incruse  Continue to wean off oxygen.   19. Neuropathy: Managed with spinal cord stimulator and Lyrica (Dr. Beverly Gust Shaw/[facility]).   -11-23: Patient complaining of worsening symptoms, especially at night.  However, appears already on max dose Lyrica and venlafaxine.  Defer further medication adjustments to primary team.   11/30 pain controlled, reports robaxin helping   20. Gout flare R great toe: On colchicine bid for 5 more days, resume allopurinol in 2 to 3 weeks  -11/25 colchicine was decreased to daily, discontinue after 1 more day 21. CKD 3A:BUN/Scr-38/1.31 at admission.   -Creatinine a little higher today at 1.41/BUN 32, patient is on diuretics per cardiology  -11/26 torsemide was held yesterday, creatinine down to 1.28  -11/27 hold torsemide this evening, Cr up to 1.44  11/28  creatinine improved to 1.2, BUN 24.  As torsemide 120 mg twice daily  11/29 Cr stable at 1.21/bun 24 22. Morbid Obesity. Body mass index is 47.89 kg/m.             -Dietary counseling    LOS: 10 days A FACE TO FACE EVALUATION WAS PERFORMED  Fanny Dance 07/08/2023, 6:21 PM

## 2023-07-08 NOTE — Progress Notes (Signed)
Approximately 2210 patient reporting chest pain 5/6 out of 10. Patient initially described and throbbing, later as cramping. Patient denies pain radiating . Vitals obtained. Rapid Response notified and EKG obtained. MD France Ravens PA made aware. Rapid response assessed patient at bedside. Patient reported pain relief after 2 doses of nitroglycerin. Vitals in the chart. [address] notified of results. New lab orders received.    0010- Patient asleep, placed self on home CPAP.   0614- No further events overnight. Patient denies pain this morning.

## 2023-07-09 DIAGNOSIS — K5900 Constipation, unspecified: Secondary | ICD-10-CM

## 2023-07-09 DIAGNOSIS — I63542 Cerebral infarction due to unspecified occlusion or stenosis of left cerebellar artery: Secondary | ICD-10-CM | POA: Diagnosis not present

## 2023-07-09 DIAGNOSIS — E876 Hypokalemia: Secondary | ICD-10-CM | POA: Diagnosis not present

## 2023-07-09 DIAGNOSIS — E1169 Type 2 diabetes mellitus with other specified complication: Secondary | ICD-10-CM | POA: Diagnosis not present

## 2023-07-09 DIAGNOSIS — I10 Essential (primary) hypertension: Secondary | ICD-10-CM | POA: Diagnosis not present

## 2023-07-09 LAB — TROPONIN I (HIGH SENSITIVITY)
Troponin I (High Sensitivity): 5 ng/L (ref <18)
Troponin I (High Sensitivity): 4 ng/L (ref <18)

## 2023-07-09 LAB — BASIC METABOLIC PANEL
Anion gap: 9 (ref 5–15)
Anion gap: 7 (ref 5–15)
BUN: 21 mg/dL (ref 8–23)
BUN: 19 mg/dL (ref 8–23)
CO2: 28 mmol/L (ref 22–32)
CO2: 26 mmol/L (ref 22–32)
Calcium: 8.9 mg/dL (ref 8.9–10.3)
Calcium: 8.9 mg/dL (ref 8.9–10.3)
Chloride: 101 mmol/L (ref 98–111)
Chloride: 105 mmol/L (ref 98–111)
Creatinine, Ser: 1.21 mg/dL (ref 0.61–1.24)
Creatinine, Ser: 1.17 mg/dL (ref 0.61–1.24)
GFR, Estimated: >60 mL/min (ref >60)
GFR, Estimated: >60 mL/min (ref >60)
Glucose, Bld: 104 mg/dL — ABNORMAL HIGH (ref 70–99)
Glucose, Bld: 135 mg/dL — ABNORMAL HIGH (ref 70–99)
Potassium: 3.8 mmol/L (ref 3.5–5.1)
Potassium: 3.6 mmol/L (ref 3.5–5.1)
Sodium: 138 mmol/L (ref 135–145)
Sodium: 138 mmol/L (ref 135–145)

## 2023-07-09 LAB — GLUCOSE, CAPILLARY
Glucose-Capillary: 110 mg/dL — ABNORMAL HIGH (ref 70–99)
Glucose-Capillary: 92 mg/dL (ref 70–99)
Glucose-Capillary: 119 mg/dL — ABNORMAL HIGH (ref 70–99)
Glucose-Capillary: 102 mg/dL — ABNORMAL HIGH (ref 70–99)

## 2023-07-09 MED ORDER — POLYETHYLENE GLYCOL 3350 17 G PO PACK: 34.0000 g | PACK | Freq: Every day | ORAL | Status: DC

## 2023-07-09 MED ORDER — SENNOSIDES-DOCUSATE SODIUM 8.6-50 MG PO TABS
1.0000 | ORAL_TABLET | Freq: Every day | ORAL | Status: DC
  Administered 2023-07-09: 1 via ORAL
  Filled 2023-07-09 (×3): qty 1

## 2023-07-09 MED ORDER — POLYETHYLENE GLYCOL 3350 17 G PO PACK
34.0000 g | PACK | Freq: Once | ORAL | Status: AC
  Administered 2023-07-09: 34 g via ORAL
  Filled 2023-07-09: qty 2

## 2023-07-09 MED ORDER — POLYETHYLENE GLYCOL 3350 17 G PO PACK
17.0000 g | PACK | Freq: Every day | ORAL | Status: DC
  Administered 2023-07-10 – 2023-07-11 (×2): 17 g via ORAL
  Filled 2023-07-09 (×3): qty 1

## 2023-07-09 MED ORDER — SORBITOL 70 % SOLN: 30.0000 mL | Freq: Every day | Status: DC | PRN

## 2023-07-09 NOTE — Progress Notes (Addendum)
PROGRESS NOTE   Subjective/Complaints: Rapid response yesterday due to chest pain. Pain resolved after SL NTG. No chest pain or other concerns today.   LBM 11/28  ROS: Patient denies fever, chills,  rash, sore throat, blurred vision, nausea, vomiting, diarrhea, constipation cough or chest pain, headache, or mood change. Denies new motor or sensory change.   + Peripheral neuropathy - controlled + Insomnia chronic-improved + Chest pain- resolved this AM + constipation  Objective:   No results found. Recent Labs    07/08/23 2319  WBC 9.6  HGB 14.1  HCT 43.3  PLT 247    Recent Labs    07/08/23 2319 07/09/23 0530  NA 138 138  K 3.8 3.6  CL 101 105  CO2 28 26  GLUCOSE 104* 135*  BUN 21 19  CREATININE 1.21 1.17  CALCIUM 8.9 8.9    Intake/Output Summary (Last 24 hours) at 07/09/2023 1054 Last data filed at 07/09/2023 0932 Gross per 24 hour  Intake 1490 ml  Output 2500 ml  Net -1010 ml        Physical Exam: Vital Signs Blood pressure 113/60, pulse 86, temperature 97.6 F (36.4 C), temperature source Oral, resp. rate 18, height 6\' 2"  (1.88 m), weight (!) 154.9 kg, SpO2 95%.     General: , No apparent distress, morbidly obese.  Sitting in bed HEENT: Head is normocephalic, atraumatic,MMM neck: Supple.  No LAD Heart: Reg rate and rhythm.  No MRG Chest: CTA bilaterally without wheezes, rales, or rhonchi; nonlabored breathing.  On  Room AIR Abdomen: Soft, mildly distended, nontender, bowel sounds positive. Extremities: No clubbing, cyanosis, trace bilateral lower extremity edema-unchanged from prior exam.  Bilateral lower extremities wrapped  Psych: Appropriate mood and affect, cooperative Skin: Dry dressing in place to  b/l ankles  Neuro:  Mental Status: AAOx3, memory intact, fund of knowledge appropriate.  No apparent deficits.   MOTOR: Moving all 4 extremities in bed   From prior exams: RUE: 5/5  Deltoid, 5/5 Biceps, 5/5 Triceps,5/5 Grip LUE: 4/5 Deltoid, 4/5 Biceps, 4/5 Triceps, 4/5 Grip RLE: HF 5/5, KE 5/5, ADF 5/5, APF 5/5 LLE: HF 1-2/5, KE 1/5, ADF 2/5, APF 2/5     No hypertonia noted    SENSORY: Altered sensation to light touch in left upper extremity and left lower extremity, as well as bilateral feet and all toes. Minimal tenderness b/l Feet and ankles    Assessment/Plan: 1. Functional deficits which require 3+ hours per day of interdisciplinary therapy in a comprehensive inpatient rehab setting. Physiatrist is providing close team supervision and 24 hour management of active medical problems listed below. Physiatrist and rehab team continue to assess barriers to discharge/monitor patient progress toward functional and medical goals  Care Tool:  Bathing    Body parts bathed by patient: Right arm, Left arm, Chest, Abdomen, Front perineal area, Face, Right upper leg, Left upper leg, Right lower leg, Left lower leg, Buttocks   Body parts bathed by helper: Buttocks     Bathing assist Assist Level: Supervision/Verbal cueing     Upper Body Dressing/Undressing Upper body dressing   What is the patient wearing?: Pull over shirt    Upper body assist  Assist Level: Set up assist    Lower Body Dressing/Undressing Lower body dressing      What is the patient wearing?: Underwear/pull up (Shorts)     Lower body assist Assist for lower body dressing: Minimal Assistance - Patient > 75%     Toileting Toileting    Toileting assist Assist for toileting: Maximal Assistance - Patient 25 - 49%     Transfers Chair/bed transfer  Transfers assist     Chair/bed transfer assist level: Minimal Assistance - Patient > 75%     Locomotion Ambulation   Ambulation assist   Ambulation activity did not occur: Safety/medical concerns  Assist level: Contact Guard/Touching assist Assistive device: Rollator Max distance: 123 feet   Walk 10 feet activity   Assist   Walk 10 feet activity did not occur: Safety/medical concerns  Assist level: Contact Guard/Touching assist Assistive device: Rollator   Walk 50 feet activity   Assist Walk 50 feet with 2 turns activity did not occur: Safety/medical concerns  Assist level: Contact Guard/Touching assist Assistive device: Rollator    Walk 150 feet activity   Assist Walk 150 feet activity did not occur: Safety/medical concerns         Walk 10 feet on uneven surface  activity   Assist Walk 10 feet on uneven surfaces activity did not occur: Safety/medical concerns         Wheelchair     Assist Is the patient using a wheelchair?: Yes Type of Wheelchair: Manual    Wheelchair assist level: Independent      Wheelchair 50 feet with 2 turns activity    Assist        Assist Level: Independent   Wheelchair 150 feet activity     Assist      Assist Level: Independent   Blood pressure 113/60, pulse 86, temperature 97.6 F (36.4 C), temperature source Oral, resp. rate 18, height 6\' 2"  (1.88 m), weight (!) 154.9 kg, SpO2 95%.  Medical Problem List and Plan: 1. Functional deficits secondary to acute ischemic left cerebellar infarct with hx of prior CVA             -patient may shower, please cover RLE skin breakdown             -ELOS/Goals: 16 to 20 days, PT OT supervision to min assist, SLP supervision MRI             -Continue CIR  -Expected discharge 12/4  2.  Antithrombotics: -DVT/anticoagulation:  Pharmaceutical: Eliquis             -antiplatelet therapy: N/A 3. Chronic pain/Pain Management: Has spinal cord stimulator. Continue Tylenol prn.   --will d/c hydrocodone and monitor as denies pain at this time.  --continue robaxin prn  4. Mood/Behavior/Sleep: LCSW to follow for evaluation and support.              -antipsychotic agents: Abilify-->will change to bedtime as at home.              --chronic insomnia was being managed with Trazodone and Remeron--> d/c per  neurology --has PTSD/Nightmares but sleeping well in hospital. Resume Prasozin 2 mg/HS if BP stable.  -11/26 will restart trazodone at lower dose of 50 mg nightly -11/28 increase melatonin to 10 mg  -12/1 insomnia has improved, continue current regimen  5. Neuropsych/cognition: This patient is capable of making decisions on his own behalf. 6. Skin/Wound Care: Routine pressure relief measures.              -  Bilateral lower extremity venous stasis ulcers.  Continue current wound care with Aquacel and Xeroform 7. Fluids/Electrolytes/Nutrition: Monitor I/O. Check CMET in am. Recheck Mg and Phos level in am8.    8. HTN: BP stable. Resume Prazosin when able  --Wide complex tachycardia: No significant arrhthymias seen per Dr. Tenny Craw.              --metoprolol 12.5 mg bid and can be increased to 25 mg bid.   -11/22 metoprolol was discontinued due to bradycardia  -11-23: Normotensive, mild intermittent bradycardia, appreciate cardiology assistance and medication titration  -11/26 BP soft but stable continue current regimen  -Toprol-XL 25 mg daily per cardiology  -12/1 BM controlled, continue current      07/09/2023    4:35 AM 07/09/2023    4:33 AM 07/09/2023    4:25 AM  Vitals with BMI  Weight 341 lbs 6 oz    BMI 43.81    Systolic  113 108  Diastolic  60 62  Pulse  86 81     9. T2DM with peripheral neuropathy: Hgb A1C-6.5 and well controlled. Monitor BS ac/hs and use SSI for elevated BS.              --continue insulin glargine 40 units daily with novolog 12 units TID  -11/22 fair control, continue to monitor for now  11-27 well controlled decrease glargine to 36 units to reduce risk of hypoglycemia  11/28 decrease NovoLog to 10 units 3 times daily and glargine to 34 units to avoid risk of hypoglycemia  11/30 decrease Novolog to 8 units TID to avoid hypoglycemia  12/1 well controlled CBGS, continue current regimen   CBG (last 3)  Recent Labs    07/08/23 1629 07/08/23 2110  07/09/23 0608  GLUCAP 120* 127* 110*    10. Chronic diastolic CHF: Added 1500 cc FR per recommendations. HH diet  --Daily weight and monitor for signs of overload--weight at admission 11/16- 373 lbs.  --evaluated by cards today-->BB increased to 25 mg BID, Demadex bid. Zaroxolyn X 1 dose --cards will follow for response in am.   11/22 cardiology following-appreciate assistance, cardiology holding off on spironolactone due to dizziness 11-23: No new weights overnight, no apparent fluid overload, continue current regimen 11/24: weight uptrending mildly from 11-22, no external signs of worsening volume overload, chest x-ray clear.  Per cardiology, could consider IV Lasix if additional diuresis needed, will defer at this time given medication changes below. 11/26 per cardiology continue torsemide 120 mg twice daily 11/27 wt trending down, off O2 [place], continue current 11/28 weight appears overall stable, tolerating room air continue to monitor 12/1 wt stable, continue current medication, stable on RA, episode of chest pain last night, troponin-neg, EKG with 1st degree block. Resolved with NTG. Continue to monitor   Filed Weights   07/06/23 0600 07/07/23 0500 07/09/23 0435  Weight: (!) 155.2 kg (!) 153.4 kg (!) 154.9 kg    11. Wide complex tachycardia/hx PAF/bradycardia/PVCs: Started metoprolol 25mg  BID by cardiology. On Eliquis  -11/22 Zio started by cardiology, metoprolol was stopped.  Cardiology feels that pulse may be undercalled due to PVCs.  11-24: Per recommendation by EP, resumed metoprolol 12.5 mg twice daily despite ongoing bradycardia.  Nursing informed not to hold medication for heart rate.  Unfortunately, unable to interrogate Zio patch.  Appreciate cardiology input in this case.  -11/25 cardiology has seen him today, will monitor for recommendations, zio patch in place  12. Seizure d/o: Has been seizure free on Keppra  and Vimpat -->followed by Dr. Teresa Coombs.  13. Major depressive:  PTA--was on Effexor, Abilify, Remeron and Trazodone.              --lethargy could be due to daytime Abilify-->will change to nights             --Remeron and trazodone on hold per neuro  -Continue melatonin 14. GAD w/panic attacks/PTSD: Chronic issues onoging and followed by [facility].  --Was on Prazosin and Buspar.             --Mood stable on current dose Buspar. Monitor for now  15. Hypokalemia: Improved with supplementation.  -11/21 potassium lower today at 3.1, will change potassium supplement to 20 mEq twice daily.  Recheck tomorrow  -11/22 K+ up to 3.2, increase to in am and in evening  -11/22 potassium supplement increased to 40 mEq twice daily; repeat on Monday  -11/25 potassium up to 3.4 continue supplementation  11/21 up to 4.1 this AM, continue current regimen and monitor   12/1 K+ 3.6 today, continue Klor con M TID   16. Leucocytosis: WBC trending down-->monitor  -11/25 within normal limits 17. OSA: Now compliant with CPAP but has been requiring 5 L oxygen per [facility].              --question OHS as contributing factor leading to hypersomnia. Wean oxygen as able.   -Appears to be tolerating weaning down nasal O2, he reports he used his CPAP at night  07-01-23: Compliant with CPAP, stable on 2 L nasal cannula  11/28 compliant with CPAP  18. COPD: On Trilegy at home -->on Breo/Incruse  Continue to wean off oxygen.   19. Neuropathy: Managed with spinal cord stimulator and Lyrica (Dr. Beverly Gust Shaw/[facility]).   -11-23: Patient complaining of worsening symptoms, especially at night.  However, appears already on max dose Lyrica and venlafaxine.  Defer further medication adjustments to primary team.   11/30 pain controlled, reports robaxin helping   20. Gout flare R great toe: On colchicine bid for 5 more days, resume allopurinol in 2 to 3 weeks  -11/25 colchicine was decreased to daily, discontinue after 1 more day 21. CKD 3A:BUN/Scr-38/1.31 at admission.   -Creatinine a little  higher today at 1.41/BUN 32, patient is on diuretics per cardiology  -11/26 torsemide was held yesterday, creatinine down to 1.28  -11/27 hold torsemide this evening, Cr up to 1.44  11/28 creatinine improved to 1.2, BUN 24.  As torsemide 120 mg twice daily  12/1 Cr/BUN down to 1.17/16 today, recheck tuesday 22. Morbid Obesity. Body mass index is 47.89 kg/m.             -Dietary counseling 23. Constipation   -12/1 miralax ordered, Senokot HS. PRN sorbitol 30ml   LOS: 11 days A FACE TO FACE EVALUATION WAS PERFORMED  Fanny Dance 07/09/2023, 10:54 AM

## 2023-07-10 DIAGNOSIS — R600 Localized edema: Secondary | ICD-10-CM

## 2023-07-10 DIAGNOSIS — I10 Essential (primary) hypertension: Secondary | ICD-10-CM | POA: Diagnosis not present

## 2023-07-10 DIAGNOSIS — I63542 Cerebral infarction due to unspecified occlusion or stenosis of left cerebellar artery: Secondary | ICD-10-CM | POA: Diagnosis not present

## 2023-07-10 DIAGNOSIS — G894 Chronic pain syndrome: Secondary | ICD-10-CM

## 2023-07-10 LAB — GLUCOSE, CAPILLARY
Glucose-Capillary: 97 mg/dL (ref 70–99)
Glucose-Capillary: 86 mg/dL (ref 70–99)
Glucose-Capillary: 87 mg/dL (ref 70–99)
Glucose-Capillary: 113 mg/dL — ABNORMAL HIGH (ref 70–99)

## 2023-07-10 MED ORDER — ALLOPURINOL 100 MG PO TABS: 200.0000 mg | ORAL_TABLET | Freq: Every day | ORAL | Status: DC

## 2023-07-10 MED ORDER — TRAZODONE HCL 100 MG PO TABS: 50.0000 mg | ORAL_TABLET | Freq: Every day | ORAL | Status: DC

## 2023-07-10 MED ORDER — ACETAMINOPHEN ER 650 MG PO TBCR: 650.0000 mg | EXTENDED_RELEASE_TABLET | Freq: Three times a day (TID) | ORAL | Status: DC | PRN

## 2023-07-10 MED ORDER — MUSCLE RUB 10-15 % EX CREA: 1.0000 | TOPICAL_CREAM | CUTANEOUS | Status: DC | PRN

## 2023-07-10 NOTE — Plan of Care (Signed)
  Problem: RH Problem Solving Goal: LTG Patient will demonstrate problem solving for (SLP) Description: LTG:  Patient will demonstrate problem solving for basic/complex daily situations with cues  (SLP) Outcome: Completed/Met Flowsheets (Taken 07/10/2023 1615) LTG Patient will demonstrate problem solving for: Modified Independent   Problem: RH Memory Goal: LTG Patient will demonstrate ability for day to day (SLP) Description: LTG:   Patient will demonstrate ability for day to day recall/carryover during cognitive/linguistic activities with assist  (SLP) Outcome: Completed/Met Flowsheets (Taken 07/10/2023 1615) LTG: Patient will demonstrate ability for day to day recall: New information LTG: Patient will demonstrate ability for day to day recall/carryover during cognitive/linguistic activities with assist (SLP): Modified Independent   Problem: RH Attention Goal: LTG Patient will demonstrate this level of attention during functional activites (SLP) Description: LTG:  Patient will will demonstrate this level of attention during functional activites (SLP) Outcome: Completed/Met Flowsheets Taken 07/10/2023 1615 Patient will demonstrate this level of attention during cognitive/linguistic activities in: Controlled LTG: Patient will demonstrate this level of attention during cognitive/linguistic activities with assistance of (SLP): Modified Independent Taken 06/29/2023 1244 Patient will demonstrate during cognitive/linguistic activities the attention type of: Sustained   Problem: RH Expression Communication Goal: LTG Patient will increase word finding of common (SLP) Description: LTG:  Patient will increase word finding of common objects/daily info/abstract thoughts with cues using compensatory strategies (SLP). Outcome: Not Met (add Reason) Flowsheets (Taken 07/10/2023 1615) LTG: Patient will increase word finding of common (SLP): Modified Independent Note: Slower than anticipated progress

## 2023-07-10 NOTE — Plan of Care (Signed)
  Problem: Nutritional: Goal: Maintenance of adequate nutrition will improve Outcome: Progressing Goal: Progress toward achieving an optimal weight will improve Outcome: Progressing   Problem: Skin Integrity: Goal: Risk for impaired skin integrity will decrease Outcome: Progressing   Problem: Tissue Perfusion: Goal: Adequacy of tissue perfusion will improve Outcome: Progressing

## 2023-07-10 NOTE — Progress Notes (Signed)
PROGRESS NOTE   Subjective/Complaints: No further issues with chest pain. Found him walking with PT today. Has consistent pain in right knee with weight bearing. Wearing a right knee sleeve with some benefit.   ROS: Patient denies fever, rash, sore throat, blurred vision, dizziness, nausea, vomiting, diarrhea, cough, shortness of breath or chest pain,   headache, or mood change.     Objective:   No results found. Recent Labs    07/08/23 2319  WBC 9.6  HGB 14.1  HCT 43.3  PLT 247    Recent Labs    07/08/23 2319 07/09/23 0530  NA 138 138  K 3.8 3.6  CL 101 105  CO2 28 26  GLUCOSE 104* 135*  BUN 21 19  CREATININE 1.21 1.17  CALCIUM 8.9 8.9    Intake/Output Summary (Last 24 hours) at 07/10/2023 0913 Last data filed at 07/10/2023 5284 Gross per 24 hour  Intake 1080 ml  Output 3575 ml  Net -2495 ml        Physical Exam: Vital Signs Blood pressure 106/67, pulse 64, temperature 97.6 F (36.4 C), temperature source Oral, resp. rate 18, height 6\' 2"  (1.88 m), weight (!) 155.5 kg, SpO2 96%.     Constitutional: No distress . Vital signs reviewed. HEENT: NCAT, EOMI, oral membranes moist Neck: supple Cardiovascular: RRR without murmur. No JVD    Respiratory/Chest: CTA Bilaterally without wheezes or rales. Normal effort    GI/Abdomen: BS +, non-tender, non-distended Ext: no clubbing, cyanosis, 1+ LE edema Psych: pleasant and cooperative  Skin: Dry dressing in place to  b/l ankles, with ACE, both loose  Neuro:  Mental Status: AAOx3, memory intact, fund of knowledge appropriate.  No apparent deficits.  RUE: 5/5 Deltoid, 5/5 Biceps, 5/5 Triceps,5/5 Grip LUE: 4/5 Deltoid, 4/5 Biceps, 4/5 Triceps, 4/5 Grip RLE: HF 5/5, KE 5/5, ADF 5/5, APF 5/5 LLE: HF 2/5, KE 2/5, ADF 3/5, APF 3/5  SENSORY: Altered sensation to light touch in left upper extremity and left lower extremity, as well as bilateral feet and all toes.  Minimal tenderness b/l Feet and ankles  Musc: genu recurvatum bilaterally in stance (pt says this is chronic)  Assessment/Plan: 1. Functional deficits which require 3+ hours per day of interdisciplinary therapy in a comprehensive inpatient rehab setting. Physiatrist is providing close team supervision and 24 hour management of active medical problems listed below. Physiatrist and rehab team continue to assess barriers to discharge/monitor patient progress toward functional and medical goals  Care Tool:  Bathing    Body parts bathed by patient: Right arm, Left arm, Chest, Abdomen, Front perineal area, Face, Right upper leg, Left upper leg, Right lower leg, Left lower leg, Buttocks   Body parts bathed by helper: Buttocks     Bathing assist Assist Level: Supervision/Verbal cueing     Upper Body Dressing/Undressing Upper body dressing   What is the patient wearing?: Pull over shirt    Upper body assist Assist Level: Set up assist    Lower Body Dressing/Undressing Lower body dressing      What is the patient wearing?: Underwear/pull up (Shorts)     Lower body assist Assist for lower body dressing: Minimal Assistance -  Patient > 75%     Editor, commissioning assist Assist for toileting: Maximal Assistance - Patient 25 - 49%     Transfers Chair/bed transfer  Transfers assist     Chair/bed transfer assist level: Minimal Assistance - Patient > 75%     Locomotion Ambulation   Ambulation assist   Ambulation activity did not occur: Safety/medical concerns  Assist level: Contact Guard/Touching assist Assistive device: Rollator Max distance: 123 feet   Walk 10 feet activity   Assist  Walk 10 feet activity did not occur: Safety/medical concerns  Assist level: Contact Guard/Touching assist Assistive device: Rollator   Walk 50 feet activity   Assist Walk 50 feet with 2 turns activity did not occur: Safety/medical concerns  Assist level: Contact  Guard/Touching assist Assistive device: Rollator    Walk 150 feet activity   Assist Walk 150 feet activity did not occur: Safety/medical concerns         Walk 10 feet on uneven surface  activity   Assist Walk 10 feet on uneven surfaces activity did not occur: Safety/medical concerns         Wheelchair     Assist Is the patient using a wheelchair?: Yes Type of Wheelchair: Manual    Wheelchair assist level: Independent      Wheelchair 50 feet with 2 turns activity    Assist        Assist Level: Independent   Wheelchair 150 feet activity     Assist      Assist Level: Independent   Blood pressure 106/67, pulse 64, temperature 97.6 F (36.4 C), temperature source Oral, resp. rate 18, height 6\' 2"  (1.88 m), weight (!) 155.5 kg, SpO2 96%.  Medical Problem List and Plan: 1. Functional deficits secondary to acute ischemic left cerebellar infarct with hx of prior CVA             -patient may shower, please cover RLE skin breakdown             -ELOS/Goals: 16 to 20 days, PT OT supervision to min assist, SLP supervision MRI            -Continue CIR therapies including PT, OT. Could he benefit from knee cages?  -Expected discharge 12/4  2.  Antithrombotics: -DVT/anticoagulation:  Pharmaceutical: Eliquis             -antiplatelet therapy: N/A 3. Chronic pain/Pain Management: Has spinal cord stimulator. Continue Tylenol prn.   --will d/c hydrocodone and monitor as denies pain at this time.  --continue robaxin prn  4. Mood/Behavior/Sleep: LCSW to follow for evaluation and support.              -antipsychotic agents: Abilify-->will change to bedtime as at home.              --chronic insomnia was being managed with Trazodone and Remeron--> d/c per neurology --has PTSD/Nightmares but sleeping well in hospital. Resume Prasozin 2 mg/HS if BP stable.  -11/26 will restart trazodone at lower dose of 50 mg nightly -11/28 increase melatonin to 10 mg  -12/2  insomnia has improved, continue current regimen  5. Neuropsych/cognition: This patient is capable of making decisions on his own behalf. 6. Skin/Wound Care: Routine pressure relief measures.              -Bilateral lower extremity venous stasis ulcers.  Continue current wound care with Aquacel and Xeroform 7. Fluids/Electrolytes/Nutrition: Monitor I/O. Check CMET in am. Recheck Mg and Phos level  in am8.    8. HTN: BP stable. Resume Prazosin when able  --Wide complex tachycardia: No significant arrhthymias seen per Dr. Tenny Craw.              --metoprolol 12.5 mg bid and can be increased to 25 mg bid.   -11/22 metoprolol was discontinued due to bradycardia  -11-23: Normotensive, mild intermittent bradycardia, appreciate cardiology assistance and medication titration  -11/26 BP soft but stable continue current regimen  -Toprol-XL 25 mg daily per cardiology  -12/2 bp remains under control      07/10/2023    7:49 AM 07/10/2023    3:11 AM 07/10/2023    3:10 AM  Vitals with BMI  Weight  342 lbs 13 oz   BMI  43.99   Systolic 106  122  Diastolic 67  65  Pulse 64  61     9. T2DM with peripheral neuropathy: Hgb A1C-6.5 and well controlled. Monitor BS ac/hs and use SSI for elevated BS.              --continue insulin glargine 40 units daily with novolog 12 units TID  -11/22 fair control, continue to monitor for now  11-27 well controlled decrease glargine to 36 units to reduce risk of hypoglycemia  11/28 decrease NovoLog to 10 units 3 times daily and glargine to 34 units to avoid risk of hypoglycemia  11/30 decrease Novolog to 8 units TID to avoid hypoglycemia  12/1 well controlled CBGS, continue current regimen   CBG (last 3)  Recent Labs    07/09/23 1642 07/09/23 2041 07/10/23 0605  GLUCAP 119* 102* 97    10. Chronic diastolic CHF: Added 1500 cc FR per recommendations. HH diet  --Daily weight and monitor for signs of overload--weight at admission 11/16- 373 lbs.  --evaluated by cards  today-->BB increased to 25 mg BID, Demadex bid. Zaroxolyn X 1 dose --cards will follow for response in am.   11/22 cardiology following-appreciate assistance, cardiology holding off on spironolactone due to dizziness 11-23: No new weights overnight, no apparent fluid overload, continue current regimen 11/24: weight uptrending mildly from 11-22, no external signs of worsening volume overload, chest x-ray clear.  Per cardiology, could consider IV Lasix if additional diuresis needed, will defer at this time given medication changes below. 11/26 per cardiology continue torsemide 120 mg twice daily 11/27 wt trending down, off O2 [place], continue current 11/28 weight appears overall stable, tolerating room air continue to monitor 12/1 wt stable, continue current medication, stable on RA, episode of chest pain last night, troponin-neg, EKG with 1st degree block. Resolved with NTG. Continue to monitor   12/2 weights trending up a bit again, but he was -2.3 L yesterday and 20L for admit  -monitor only for now [facility] Weights   07/07/23 0500 07/09/23 0435 07/10/23 0311  Weight: (!) 153.4 kg (!) 154.9 kg (!) 155.5 kg    11. Wide complex tachycardia/hx PAF/bradycardia/PVCs: Started metoprolol 25mg  BID by cardiology. On Eliquis  -11/22 Zio started by cardiology, metoprolol was stopped.  Cardiology feels that pulse may be undercalled due to PVCs.  11-24: Per recommendation by EP, resumed metoprolol 12.5 mg twice daily despite ongoing bradycardia.  Nursing informed not to hold medication for heart rate.  Unfortunately, unable to interrogate Zio patch.  Appreciate cardiology input in this case.  -11/25 cardiology has seen him today, will monitor for recommendations, zio patch in place  12. Seizure d/o: Has been seizure free on Keppra and Vimpat -->followed by Dr. Teresa Coombs.  13. Major depressive: PTA--was on Effexor, Abilify, Remeron and Trazodone.              --lethargy could be due to daytime Abilify-->will change  to nights             --Remeron and trazodone on hold per neuro  -Continue melatonin 14. GAD w/panic attacks/PTSD: Chronic issues onoging and followed by [facility].  --Was on Prazosin and Buspar.             --Mood stable on current dose Buspar. Monitor for now  15. Hypokalemia: Improved with supplementation.  -11/21 potassium lower today at 3.1, will change potassium supplement to 20 mEq twice daily.  Recheck tomorrow  -11/22 K+ up to 3.2, increase to in am and in evening  -11/22 potassium supplement increased to 40 mEq twice daily; repeat on Monday  -11/25 potassium up to 3.4 continue supplementation  11/21 up to 4.1 this AM, continue current regimen and monitor   12/1 K+ 3.6 today, continue Klor con M TID   16. Leucocytosis: WBC trending down-->monitor  -11/25 within normal limits 17. OSA: Now compliant with CPAP but has been requiring 5 L oxygen per [place].              --question OHS as contributing factor leading to hypersomnia. Wean oxygen as able.   -Appears to be tolerating weaning down nasal O2, he reports he used his CPAP at night  07-01-23: Compliant with CPAP, stable on 2 L nasal cannula  12/2 remains compliant with CPAP  18. COPD: On Trilegy at home -->on Breo/Incruse  Continue to wean off oxygen.   19. Neuropathy: Managed with spinal cord stimulator and Lyrica (Dr. Beverly Gust Shaw/[facility]).   -11-23: Patient complaining of worsening symptoms, especially at night.  However, appears already on max dose Lyrica and venlafaxine.  Defer further medication adjustments to primary team.   11/30 pain controlled, reports robaxin helping   20. Gout flare R great toe: On colchicine bid for 5 more days, resume allopurinol in 2 to 3 weeks  -11/25 colchicine was decreased to daily, discontinue after 1 more day 21. CKD 3A:BUN/Scr-38/1.31 at admission.   -Creatinine a little higher today at 1.41/BUN 32, patient is on diuretics per cardiology  -11/26 torsemide was held yesterday,  creatinine down to 1.28  -11/27 hold torsemide this evening, Cr up to 1.44  11/28 creatinine improved to 1.2, BUN 24.  As torsemide 120 mg twice daily  12/1 Cr/BUN down to 1.17/16 today, recheck tuesday 22. Morbid Obesity. Body mass index is 47.89 kg/m.             -Dietary counseling 23. Constipation   -12/1 miralax ordered, Senokot HS. PRN sorbitol 30ml  -had large BM 12/1  LOS: 12 days A FACE TO FACE EVALUATION WAS PERFORMED  Ranelle Oyster 07/10/2023, 9:13 AM

## 2023-07-10 NOTE — Progress Notes (Signed)
Speech Language Pathology Discharge Summary  Patient Details  Name: MUHANAD BJORNSON MRN: [id number] Date of Birth: 1956-06-29  Date of Discharge from SLP service:July 10, 2023  Today's Date: 07/10/2023 SLP Individual Time: 1302-1401 SLP Individual Time Calculation (min): 59 min   Skilled Therapeutic Interventions:  Patient was seen in PM to address cognitive re- training and word finding. Pt was alert and seated upright in recliner upon SLP arrival. He greeted this SLP by name and was agreeable for session. Pt challenged to complete medication management task started in last session. SLP verbalized medications to pre fill in a BID pill box. Pt recalled rationale for taking medication and frequency with mod I. He filled a BID pill box with accurate identification of errors and indep correction. In additional minutes of session, SLP guided pt in a conversational exchange in order to assess word finding. SLP reviewed strategies of circumlocution and word finding. Throughout conversation, pt with x2 instances of halting. Pt able to carryover strategies indep and continue conversation without intervention. At conclusion of session, pt was left seated upright in recliner with call button within reach.    Patient has met 3 of 4 long term goals.  Patient to discharge at overall Modified Independent level.  Reasons goals not met: Slower than anticipated progress   Clinical Impression/Discharge Summary: Patient has made steady gains and has met 3 of 4 LTG's this admission due to improved recall, problem solving and word finding. Currently, pt is an overall mod I for attention and word finding. Pt/ family education complete and patient will discharge home with intermittent supervision from family and friends. No f/u skilled services warranted at this time.  Care Partner:  Caregiver Able to Provide Assistance: Yes  Type of Caregiver Assistance: Cognitive  Recommendation:  None     Equipment: none    Reasons for discharge: Treatment goals met;Discharged from hospital   Patient/Family Agrees with Progress Made and Goals Achieved: Yes    Renaee Munda 07/10/2023, 4:19 PM

## 2023-07-10 NOTE — Progress Notes (Signed)
Physical Therapy Session Note  Patient Details  Name: Lawrence [name] MRN: [id number] Date of Birth: Oct 31, 1955  Today's Date: 07/10/2023 PT Individual Time: 0802-0901 PT Individual Time Calculation (min): 59 min   Short Term Goals: Week 2:  PT Short Term Goal 1 (Week 2): STG=LTG 2/2 ELOS  Skilled Therapeutic Interventions/Progress Updates:      Pt supine in bed upon arrival. Pt agreeable to therapy. Pt reports 6/10 R knee pain, notified nursing, therapist provided rest and repositioning throughout session.   Pt reports he got a new knee brace, therapist donned with total A to ensure proper fit.   Pt ambulated with rollator and CGA from room to main gym, verbal/tactie  cues provided for maintenance of slight ben in right knee and step width to reduce R LE adducting to past midline, pt demos improved steppage of R LE but difficulty correcting knee hyperextension. Pt reports improved comfort with R knee brace.   Pt donned and doffed knee brace x3 throughout session with supervision/min A, verbal cues provided for technique and correct orientation. Pt demos improved carry over with continued practice.   Pt performed standing L LE marching  2x10 from mat table with therapist blocking to prevent R knee hyperextension, verbal and tactile cues provided for upright posture, quad activation and maintenance of slight knee bend in R knee.   Pt performed sit to stand with rollator   Pt picked up 2 cones from standing with rollator and reacher, with min A and heavy/tactile cues for safety 2/2 increased B knee hyperextension with forward trunk lean. Pt reports his wife previously picked up stuff from home from standing position when walking. Education provided to continue this as pt wife will need to be with pt providing CGA with ambulation. Pt agreeable. Education provided to utilize reacher to pick up objects off floor from seated position to reduce fall risk. Pt agreeable and reports he has 4  reachers at home that he uses for that purpose.   Pt ambulated main gym to room with rollator, and CGA/min A with fatigue, 2/2 heavy UE support on Rollator. Upon return to room, theraist reiterated previous energy conservation techniques and taking rest break on rollator with fatigue. Pt able to recall signs and symptoms of fatgiue,  pt reports he should have taken a seated rest break on rollator at nursing station.   Pt seated in recliner with all needs within reach and bed alarm on.   Therapy Documentation Precautions:  Precautions Precautions: Fall Precaution Comments: L hemi, Restrictions Weight Bearing Restrictions: No  Therapy/Group: Individual Therapy  [facility] [place], Fuig, DPT  07/10/2023, 8:24 AM

## 2023-07-10 NOTE — Progress Notes (Signed)
Occupational Therapy Session Note  Patient Details  Name: Lawrence [name] MRN: [id number] Date of Birth: September 14, 1955  Today's Date: 07/10/2023 OT Individual Time: 1105-1200 OT Individual Time Calculation (min): 55 min   Today's Date: 07/10/2023 OT Individual Time: 1420-1530 OT Individual Time Calculation (min): 70 min   Short Term Goals: Week 2:  OT Short Term Goal 1 (Week 2): STGs=LTGs due to patient's estimated length of stay.  Skilled Therapeutic Interventions/Progress Updates:   Session 1: Pt greeted sitting in recliner for skilled OT session with focus on IADL participation and general conditioning.   Pain: Pt reported 2/10 in R-knee and bilateral feet pain,  OT offering intermediate rest breaks and positioning suggestions throughout session to address pain/fatigue and maximize participation/safety in session.   Functional Transfers: Pt performs all STS transfers with close supervision + rollator, ambulating from room<>ortho gym with same level of assistance, cuing required for upright gaze and decreased BUE reliance.   Self Care Tasks: Pt and OT discuss home kitchen setup, simple meal prep simulated at seated level on rollator. Pt demos appropriate levels of kitchen safety, including transportation of food and during meal clean up.   Therapeutic Exercise: Pt instructed in 2 x 10 reps of bicep curls and shoulder press, cuing required for correct form.    Education: Pt re-educated on purpose of rollator at home-level mobility, re-iterating the rollator is not meant to roll around the home.   Pt remained resting in recliner with 4Ps assessed and immediate needs met. Pt continues to be appropriate for skilled OT intervention to promote further functional independence in ADLs/IADLs.   Session 2: Pt greeted sitting in recliner for skilled OT session with focus on LUE NMR.   Pain: Pt reported 3/10 pain in B-feet and 2/10 in R-knee, pre-medicated. Pt with increased reports of fatigue.  OT offering intermediate rest breaks and positioning suggestions throughout session to address pain/fatigue and maximize participation/safety in session.   Functional Transfers: Pt performs STS transfers with CGA-light Min A + rollator due to increased fatigue. Pt ambulates from room<>main therapy gym with close supervision, x1 instance of abrupt stop, stating "I almost fell", no LOB. Ambulation back to room with x1 seated rest-break.   Therapeutic Activities:  Grip Strength RUE ~33 lbs LUE ~25 lbs    9 HPT RUE= 58 secs LUE= 64 secs  Therapeutic Exercise: Pt instructed in series of UE strengthening exercises with green thera-band, details below: -Horizontal Ab/adduction -Shoulder Flexion/Extension with therapist anchoring -Shoulder Diagonal Pulls -Tricep Extensions  Pt completes 1 set /5 reps of each exercise, multi-modal cuing for correct form. Rest breaks provided intermediately.     Pt remained sitting in recliner with 4Ps assessed and immediate needs met. Pt continues to be appropriate for skilled OT intervention to promote further functional independence in ADLs/IADLs.   Therapy Documentation Precautions:  Precautions Precautions: Fall Precaution Comments: L hemi, Restrictions Weight Bearing Restrictions: No   Therapy/Group: Individual Therapy  Lou Cal, OTR/L, MSOT  07/10/2023, 6:45 AM

## 2023-07-11 ENCOUNTER — Encounter: Payer: Self-pay | Admitting: "Endocrinology

## 2023-07-11 LAB — GLUCOSE, CAPILLARY
Glucose-Capillary: 105 mg/dL — ABNORMAL HIGH (ref 70–99)
Glucose-Capillary: 153 mg/dL — ABNORMAL HIGH (ref 70–99)
Glucose-Capillary: 98 mg/dL (ref 70–99)
Glucose-Capillary: 139 mg/dL — ABNORMAL HIGH (ref 70–99)

## 2023-07-11 LAB — CBC
HCT: 43.5 % (ref 39.0–52.0)
Hemoglobin: 13.9 g/dL (ref 13.0–17.0)
MCH: 28.7 pg (ref 26.0–34.0)
MCHC: 32 g/dL (ref 30.0–36.0)
MCV: 89.9 fL (ref 80.0–100.0)
Platelets: 246 10*3/uL (ref 150–400)
RBC: 4.84 MIL/uL (ref 4.22–5.81)
RDW: 15.1 % (ref 11.5–15.5)
WBC: 10.2 10*3/uL (ref 4.0–10.5)
nRBC: 0 % (ref 0.0–0.2)

## 2023-07-11 LAB — BASIC METABOLIC PANEL
Anion gap: 9 (ref 5–15)
BUN: 18 mg/dL (ref 8–23)
CO2: 27 mmol/L (ref 22–32)
Calcium: 8.9 mg/dL (ref 8.9–10.3)
Chloride: 104 mmol/L (ref 98–111)
Creatinine, Ser: 1.1 mg/dL (ref 0.61–1.24)
GFR, Estimated: >60 mL/min (ref >60)
Glucose, Bld: 109 mg/dL — ABNORMAL HIGH (ref 70–99)
Potassium: 3.7 mmol/L (ref 3.5–5.1)
Sodium: 140 mmol/L (ref 135–145)

## 2023-07-11 MED ORDER — POTASSIUM CHLORIDE CRYS ER 20 MEQ PO TBCR
40.0000 meq | EXTENDED_RELEASE_TABLET | Freq: Three times a day (TID) | ORAL | 0 refills | Status: DC
  Filled 2023-07-11: qty 180, 30d supply, fill #0

## 2023-07-11 MED ORDER — TRESIBA FLEXTOUCH 100 UNIT/ML ~~LOC~~ SOPN: 30.0000 [IU] | PEN_INJECTOR | Freq: Every day | SUBCUTANEOUS | Status: DC

## 2023-07-11 MED ORDER — ALLOPURINOL 100 MG PO TABS: 100.0000 mg | ORAL_TABLET | Freq: Two times a day (BID) | ORAL | Status: DC

## 2023-07-11 MED ORDER — ALLOPURINOL 100 MG PO TABS: ORAL_TABLET | ORAL | Status: DC

## 2023-07-11 MED ORDER — ALLOPURINOL 300 MG PO TABS: 150.0000 mg | ORAL_TABLET | Freq: Two times a day (BID) | ORAL | Status: DC

## 2023-07-11 MED ORDER — INSULIN GLARGINE-YFGN 100 UNIT/ML ~~LOC~~ SOLN
30.0000 [IU] | SUBCUTANEOUS | Status: DC
  Administered 2023-07-12: 30 [IU] via SUBCUTANEOUS
  Filled 2023-07-11: qty 0.3

## 2023-07-11 MED ORDER — ALLOPURINOL 100 MG PO TABS: 200.0000 mg | ORAL_TABLET | Freq: Two times a day (BID) | ORAL | Status: DC

## 2023-07-11 MED ORDER — POLYETHYLENE GLYCOL 3350 17 GM/SCOOP PO POWD
17.0000 g | Freq: Every day | ORAL | 0 refills | Status: AC
  Filled 2023-07-11: qty 238, 14d supply, fill #0

## 2023-07-11 MED ORDER — NOVOLOG FLEXPEN 100 UNIT/ML ~~LOC~~ SOPN
PEN_INJECTOR | SUBCUTANEOUS | 3 refills | Status: DC
  Filled 2023-07-11: qty 15, 30d supply, fill #0

## 2023-07-11 MED ORDER — METOPROLOL SUCCINATE ER 25 MG PO TB24
25.0000 mg | ORAL_TABLET | Freq: Every day | ORAL | 0 refills | Status: DC
  Filled 2023-07-11: qty 30, 30d supply, fill #0

## 2023-07-11 MED ORDER — COLCHICINE 0.6 MG PO TABS
0.6000 mg | ORAL_TABLET | Freq: Every day | ORAL | Status: DC
  Administered 2023-07-11 – 2023-07-12 (×2): 0.6 mg via ORAL
  Filled 2023-07-11 (×2): qty 1

## 2023-07-11 MED ORDER — ALLOPURINOL 100 MG PO TABS
100.0000 mg | ORAL_TABLET | Freq: Every day | ORAL | Status: DC
  Administered 2023-07-11 – 2023-07-12 (×2): 100 mg via ORAL
  Filled 2023-07-11 (×2): qty 1

## 2023-07-11 MED ORDER — SENNOSIDES-DOCUSATE SODIUM 8.6-50 MG PO TABS
1.0000 | ORAL_TABLET | Freq: Every day | ORAL | 0 refills | Status: DC
  Filled 2023-07-11: qty 30, 30d supply, fill #0

## 2023-07-11 MED ORDER — METHOCARBAMOL 500 MG PO TABS
500.0000 mg | ORAL_TABLET | Freq: Three times a day (TID) | ORAL | 0 refills | Status: DC | PRN
  Filled 2023-07-11: qty 60, 20d supply, fill #0

## 2023-07-11 MED ORDER — COLCHICINE 0.6 MG PO TABS
0.6000 mg | ORAL_TABLET | Freq: Every day | ORAL | 0 refills | Status: DC
  Filled 2023-07-11: qty 6, 6d supply, fill #0

## 2023-07-11 NOTE — Progress Notes (Signed)
Patient states he feels much better now, after a nap. Notified PA.    Tilden Dome, LPN

## 2023-07-11 NOTE — Progress Notes (Signed)
Patient ID: Lawrence [name], male   DOB: [birth year], 67 y.o.   MRN: [id number] Met with pt and significant other to see feels ready for discharge tomorrow. Pt is doing much better than he was doing prior to admission. Both pleased with his progress while here. Will set up non-emergency ambulance for 12:00. See in am.

## 2023-07-11 NOTE — Progress Notes (Signed)
Occupational Therapy Discharge Summary  Patient Details  Name: Lawrence [name] MRN: [id number] Date of Birth: January 16, 1956  Date of Discharge from OT service:July 11, 2023  Patient has met 10 of 10 long term goals due to improved activity tolerance, improved balance, ability to compensate for deficits, and functional use of  LEFT upper and LEFT lower extremity.  Patient to discharge at overall Supervision-CGA level. Patient's care partner is independent to provide the necessary physical and cognitive assistance at discharge.    Reasons goals not met: NA  Recommendation:  Patient will benefit from ongoing skilled OT services in home health setting to continue to advance functional skills in the area of BADL, iADL, and Reduce care partner burden.  Equipment: Long-handled sponge  Reasons for discharge: treatment goals met and discharge from hospital  Patient/family agrees with progress made and goals achieved: Yes  OT Discharge Precautions/Restrictions  Precautions Precautions: Fall Precaution Comments: Slight L hemiparesis Required Braces or Orthoses: Other Brace Other Brace: R Neoprene Hinged Knee Brace for pain management and structual alignment. Restrictions Weight Bearing Restrictions: No Pain Pain Assessment Pain Scale: 0-10 Pain Score: 3  Pain Type: Acute pain Pain Location: Foot (Including) Pain Orientation: Right Pain Descriptors / Indicators: Sharp;Aching Pain Intervention(s): Medication (See eMAR);Shower;Rest ADL ADL Equipment Provided: Long-handled sponge Eating: Independent Where Assessed-Eating: Chair, Bed level Grooming: Modified independent Where Assessed-Grooming: Sitting at sink Upper Body Bathing: Modified independent Where Assessed-Upper Body Bathing: Shower Lower Body Bathing: Modified independent Where Assessed-Lower Body Bathing: Shower Upper Body Dressing: Independent Where Assessed-Upper Body Dressing: Chair Lower Body Dressing:  Supervision/safety Where Assessed-Lower Body Dressing: Chair Toileting: Supervision/safety Where Assessed-Toileting: Bedside Commode, Toilet Toilet Transfer: Close supervision Toilet Transfer Method: Proofreader: Extra wide Gaffer: Not assessed Film/video editor: Close supervision Film/video editor Method: Designer, industrial/product: Grab bars, Other (comment) (BSC in place of TTB)  Vision Baseline Vision/History: 1 Wears glasses Patient Visual Report: Diplopia (Fluctuating with fatigue) Vision Assessment?: No apparent visual deficits Perception  Perception: Within Functional Limits Praxis Praxis: WFL Cognition Cognition Overall Cognitive Status: Within Functional Limits for tasks assessed Arousal/Alertness: Awake/alert Memory: Impaired Memory Impairment: Retrieval deficit;Decreased recall of new information Awareness: Appears intact Problem Solving: Appears intact Safety/Judgment: Appears intact Brief Interview for Mental Status (BIMS) Repetition of Three Words (First Attempt): 3 Temporal Orientation: Year: Correct Temporal Orientation: Month: Accurate within 5 days Temporal Orientation: Day: Correct Recall: "Sock": Yes, no cue required Recall: "Blue": Yes, no cue required Recall: "Bed": Yes, no cue required BIMS Summary Score: 15 Sensation Sensation Light Touch: Impaired Detail Peripheral sensation comments: Numbness in bilateral finger tips; Increased pain in R foot Light Touch Impaired Details: Impaired RUE;Impaired LUE;Absent RLE Hot/Cold: Appears Intact Proprioception: Appears Intact Stereognosis: Appears Intact Coordination Gross Motor Movements are Fluid and Coordinated: No Fine Motor Movements are Fluid and Coordinated: No Coordination and Movement Description: Limited due L hemiparesis and R-knee structural deficits. Motor  Motor Motor: Hemiplegia Motor - Skilled Clinical  Observations: L hemi Mobility  Transfers Sit to Stand: Supervision/Verbal cueing Stand to Sit: Supervision/Verbal cueing  Trunk/Postural Assessment  Cervical Assessment Cervical Assessment: Exceptions to [facility] (forward head) Thoracic Assessment Thoracic Assessment: Exceptions to [facility] (kyphosis) Lumbar Assessment Lumbar Assessment: Exceptions to [facility] (posterior pelvic tilt) Postural Control Postural Control: Deficits on evaluation Righting Reactions: delayed Protective Responses: delayed  Balance Balance Balance Assessed: Yes Static Sitting Balance Static Sitting - Balance Support: Feet supported Static Sitting - Level of Assistance: 7: Independent Dynamic Sitting Balance Dynamic Sitting -  Balance Support: Feet supported;During functional activity Dynamic Sitting - Level of Assistance: 6: Modified independent (Device/Increase time) Dynamic Sitting - Balance Activities: Reaching for objects;Lateral lean/weight shifting Static Standing Balance Static Standing - Balance Support: Bilateral upper extremity supported;During functional activity Static Standing - Level of Assistance: 5: Stand by assistance (SUP) Dynamic Standing Balance Dynamic Standing - Balance Support: During functional activity Dynamic Standing - Level of Assistance: 5: Stand by assistance (SUP-CGA (with increased fatigue and/or pain)) Dynamic Standing - Balance Activities: Reaching for objects;Lateral lean/weight shifting Extremity/Trunk Assessment RUE Assessment RUE Assessment: Within Functional Limits LUE Assessment LUE Assessment: Exceptions to [facility] General Strength Comments: 3-/5 prox, 3+/5 distal LUE Strength Left Hand Gross Grasp: Functional   Isabella Stalling 07/11/2023, 10:15 AM

## 2023-07-11 NOTE — Progress Notes (Signed)
Physical Therapy Discharge Summary  Patient Details  Name: Lawrence [name] MRN: [id number] Date of Birth: 02-04-1956  Date of Discharge from PT service:July 11, 2023  Today's Date: 07/11/2023 PT Individual Time: 1046-1130, [id number]  PT Individual Time Calculation (min): 44 min, 32 min    Patient has met 8 of 8 long term goals due to improved activity tolerance, improved balance, improved postural control, increased strength, increased range of motion, decreased pain, ability to compensate for deficits, improved attention, improved awareness, and improved coordination.  Patient to discharge at an ambulatory level  CGA .   Patient's care partner is independent to provide the necessary physical assistance at discharge.  Recommendation:  Patient will benefit from ongoing skilled PT services in home health setting to continue to advance safe functional mobility, address ongoing impairments in gait, balance, strength, endurance, and minimize fall risk.  Equipment: Pt has hospital bed, lift chair, rollator, reacher  Reasons for discharge: treatment goals met and discharge from hospital  Patient/family agrees with progress made and goals achieved: Yes  PT Discharge Precautions/Restrictions Precautions Precautions: Fall Precaution Comments: Slight L hemiparesis Required Braces or Orthoses: Other Brace Other Brace: R Neoprene Hinged Knee Brace for pain management and structual alignment. Restrictions Weight Bearing Restrictions: No Pain Interference Pain Interference Pain Effect on Sleep: 2. Occasionally Pain Interference with Therapy Activities: 1. Rarely or not at all Pain Interference with Day-to-Day Activities: 2. Occasionally Vision/Perception  Vision - History Ability to See in Adequate Light: 1 Impaired Perception Perception: Within Functional Limits Praxis Praxis: WFL  Cognition Overall Cognitive Status: Within Functional Limits for tasks assessed Arousal/Alertness:  Awake/alert Orientation Level: Oriented X4 Memory: Impaired Memory Impairment: Retrieval deficit;Decreased recall of new information Awareness: Appears intact Awareness Impairment: Emergent impairment Problem Solving: Appears intact Executive Function: Initiating;Self Correcting Initiating: Appears intact Self Correcting: Appears intact Safety/Judgment: Appears intact Sensation Sensation Light Touch: Impaired Detail Peripheral sensation comments: Numbness in bilateral finger tips; neuroapthic pain in B feet Light Touch Impaired Details: Impaired RUE;Impaired LUE;Absent RLE Hot/Cold: Appears Intact Proprioception: Appears Intact Stereognosis: Appears Intact Coordination Gross Motor Movements are Fluid and Coordinated: No Fine Motor Movements are Fluid and Coordinated: No Coordination and Movement Description: Limited due L hemiparesis and R-knee structural deficits--B Knee hyperextension (R LE>L LE) and genurecurvatum Motor  Motor Motor: Hemiplegia Motor - Skilled Clinical Observations: L hemi  Mobility Bed Mobility Bed Mobility: Rolling Right;Rolling Left Rolling Right: Independent with assistive device Rolling Left: Independent with assistive device Supine to Sit: Independent with assistive device Transfers Transfers: Sit to Stand;Stand Pivot Transfers;Stand to Sit Sit to Stand: Contact Guard/Touching assist;Supervision/Verbal cueing Stand to Sit: Supervision/Verbal cueing;Contact Guard/Touching assist Stand Pivot Transfers: Supervision/Verbal cueing;Contact Guard/Touching assist Stand Pivot Transfer Details: Verbal cues for precautions/safety;Verbal cues for safe use of DME/AE Transfer (Assistive device): Rollator Locomotion  Gait Ambulation: Yes Gait Assistance: Contact Guard/Touching assist Gait Distance (Feet): 130 Feet Assistive device: Rollator Gait Gait: Yes Gait Pattern: Impaired Gait Pattern: Step-through pattern;Trunk flexed (increased step length L LE, B  LE knee hyperextension (R LE> L LE), R LE genu varus)) Gait velocity: decreased Stairs / Additional Locomotion Stairs: No Corporate treasurer: Yes Wheelchair Assistance: Independent with Scientist, research (life sciences): Both upper extremities Wheelchair Parts Management: Supervision/cueing  Trunk/Postural Assessment  Cervical Assessment Cervical Assessment: Exceptions to [facility] (forward head) Thoracic Assessment Thoracic Assessment: Exceptions to [facility] (kyphosis) Lumbar Assessment Lumbar Assessment: Exceptions to [facility] (posterior pelvic tilt) Postural Control Postural Control: Deficits on evaluation Righting Reactions: delayed Protective Responses: delayed  Balance Balance  Balance Assessed: Yes Static Sitting Balance Static Sitting - Balance Support: Feet supported Static Sitting - Level of Assistance: 7: Independent Dynamic Sitting Balance Dynamic Sitting - Balance Support: Feet supported;During functional activity Dynamic Sitting - Level of Assistance: 6: Modified independent (Device/Increase time) Dynamic Sitting - Balance Activities: Reaching for objects;Lateral lean/weight shifting Static Standing Balance Static Standing - Balance Support: Bilateral upper extremity supported;During functional activity Static Standing - Level of Assistance: 5: Stand by assistance (supervision) Dynamic Standing Balance Dynamic Standing - Balance Support: During functional activity Dynamic Standing - Level of Assistance: 5: Stand by assistance (sup/CGA (with fatigue/pain)) Dynamic Standing - Balance Activities: Reaching for objects;Lateral lean/weight shifting Extremity Assessment  RUE Assessment RUE Assessment: Within Functional Limits LUE Assessment LUE Assessment: Exceptions to [facility] General Strength Comments: 3-/5 prox, 3+/5 distal LUE Strength Left Hand Gross Grasp: Functional RLE Assessment RLE Assessment: Exceptions to [facility] General Strength Comments: grossly  4/5 LLE Assessment LLE Assessment: Exceptions to [facility] General Strength Comments: grossly 3+/5  Treatment Session 1   Pt seated in recliner upon arrival. Pt agreeable to therapy. Pt denies any pain.   Pt wife present for family training. Education provided for pt R knee brace, purpose, orientation, donning/doffing. Education provided to wear when OOB for improved knee stability. Education provided for pt need for CGA with transfers and ambulation with Rollator, education provided on technique for performing seated rest break for transfers on rollator. Education provided on recognizing pt signs of fatigue, and encouraging pt to take seated rest break if wife notices signs of fatigue to reduce fall risk. Pt performed sit to stand and ambulation x54 feet with rollator, and performed seated rest break with rollator parked against wall (initiated by patient 2/2 dizziness, R knee pain and fatigue. Pt wife demos carry over of assist and verbal cues to pt for safety.   Pt continues to reports dizziness and foggy headedness with seated rest break. Therapist assessed vitals, BP 118/64 HR 96. Notified nurse of pt symptoms. Pt transported dependent to room in rollator for safety 2/2 pt reporting increased dizziness with attempt to stand pivot transfer to WC.   Pt performed stand pivot transfer WC to hospital bed and CGA with +2 for safety and sit to supine with supervision with use of bed features.   Pt supine in bed with bed alarm on and needs within reach.     Treatment Session 2   Pt supine in bed upon arrival. Pt agreeable to therapy. Pt denies any pain.   Pt stood and performed:   1x10 standing marching B with CGA and rollator  1x10 trunk extension and glute sets holding for 5 seconds x10  Pt performed sit to stand and ambulation 1x114, 1x55 feet with RW and CGA, verbal cues provided for decreased L LE step length and R knee flexion.   Pt seated EOB at end of session with bed alarm on and needs  within reach.       [facility], [place], DPT  07/11/2023, 3:16 PM

## 2023-07-11 NOTE — Progress Notes (Signed)
Reported to have some dizziness with therapy and was fatigued with activity-->just tired and was going to take a nap per nursing. No CP or SOB reported. EKG repeated and unchanged--on PVCs or bigeminy. Labs today look good--no abnormality. Will monitor for now.

## 2023-07-11 NOTE — Progress Notes (Signed)
Nursing education provided this morning with patient. Patient provided teach back.   Tilden Dome, LPN

## 2023-07-11 NOTE — Progress Notes (Signed)
PROGRESS NOTE   Subjective/Complaints: Patient sitting in bed this morning.  No new concerns.  He continues to have neuropathic pain in his feet-chronic and overall controlled with medications.  Denies any further chest pain yesterday or today  ROS: Patient denies fever, rash, sore throat, blurred vision, dizziness, nausea, vomiting, diarrhea, cough, shortness of breath or chest pain,   headache, or mood change.  + Chronic burning/sore pain in his feet    Objective:   No results found. Recent Labs    07/08/23 2319 07/11/23 0453  WBC 9.6 10.2  HGB 14.1 13.9  HCT 43.3 43.5  PLT 247 246    Recent Labs    07/09/23 0530 07/11/23 0453  NA 138 140  K 3.6 3.7  CL 105 104  CO2 26 27  GLUCOSE 135* 109*  BUN 19 18  CREATININE 1.17 1.10  CALCIUM 8.9 8.9    Intake/Output Summary (Last 24 hours) at 07/11/2023 1336 Last data filed at 07/11/2023 1254 Gross per 24 hour  Intake 1410 ml  Output 2100 ml  Net -690 ml        Physical Exam: Vital Signs Blood pressure 129/83, pulse 75, temperature 98.6 F (37 C), temperature source Oral, resp. rate 13, height 6\' 2"  (1.88 m), weight (!) 155.5 kg, SpO2 93%.     Constitutional: No distress . Vital signs reviewed. HEENT: NCAT, EOMI, oral membranes moist Neck: supple Cardiovascular: RRR without murmur.  No MRG Respiratory/Chest: CTA Bilaterally without wheezes or rales. Normal effort.  On room air GI/Abdomen: BS +, non-tender, non-distended Ext: no clubbing, cyanosis, trace bilateral lower extremity edema Psych: pleasant and cooperative  Skin: Dry dressing in place to  b/l ankles, with ACE, both loose  Neuro:  Mental Status: AAOx3, memory intact, fund of knowledge appropriate.  No apparent deficits.  RUE: 5/5 Deltoid, 5/5 Biceps, 5/5 Triceps,5/5 Grip LUE: 4/5 Deltoid, 4/5 Biceps, 4/5 Triceps, 4/5 Grip RLE: HF 5/5, KE 5/5, ADF 5/5, APF 5/5 LLE: HF 2/5, KE 2/5, ADF 3/5, APF  3/5  SENSORY: Altered sensation to light touch in left upper extremity and left lower extremity, as well as bilateral feet and all toes. Minimal tenderness b/l Feet and ankles - stable exam 12/3 Musc: genu recurvatum bilaterally in stance (pt says this is chronic)  Assessment/Plan: 1. Functional deficits which require 3+ hours per day of interdisciplinary therapy in a comprehensive inpatient rehab setting. Physiatrist is providing close team supervision and 24 hour management of active medical problems listed below. Physiatrist and rehab team continue to assess barriers to discharge/monitor patient progress toward functional and medical goals  Care Tool:  Bathing    Body parts bathed by patient: Right arm, Left arm, Chest, Abdomen, Front perineal area, Face, Right upper leg, Left upper leg, Right lower leg, Left lower leg, Buttocks   Body parts bathed by helper: Buttocks     Bathing assist Assist Level: Supervision/Verbal cueing     Upper Body Dressing/Undressing Upper body dressing   What is the patient wearing?: Pull over shirt    Upper body assist Assist Level: Set up assist    Lower Body Dressing/Undressing Lower body dressing      What is  the patient wearing?: Underwear/pull up (Shorts)     Lower body assist Assist for lower body dressing: Minimal Assistance - Patient > 75%     Editor, commissioning assist Assist for toileting: Maximal Assistance - Patient 25 - 49%     Transfers Chair/bed transfer  Transfers assist     Chair/bed transfer assist level: Minimal Assistance - Patient > 75%     Locomotion Ambulation   Ambulation assist   Ambulation activity did not occur: Safety/medical concerns  Assist level: Contact Guard/Touching assist Assistive device: Rollator Max distance: 123 feet   Walk 10 feet activity   Assist  Walk 10 feet activity did not occur: Safety/medical concerns  Assist level: Contact Guard/Touching  assist Assistive device: Rollator   Walk 50 feet activity   Assist Walk 50 feet with 2 turns activity did not occur: Safety/medical concerns  Assist level: Contact Guard/Touching assist Assistive device: Rollator    Walk 150 feet activity   Assist Walk 150 feet activity did not occur: Safety/medical concerns         Walk 10 feet on uneven surface  activity   Assist Walk 10 feet on uneven surfaces activity did not occur: Safety/medical concerns         Wheelchair     Assist Is the patient using a wheelchair?: Yes Type of Wheelchair: Manual    Wheelchair assist level: Independent      Wheelchair 50 feet with 2 turns activity    Assist        Assist Level: Independent   Wheelchair 150 feet activity     Assist      Assist Level: Independent   Blood pressure 129/83, pulse 75, temperature 98.6 F (37 C), temperature source Oral, resp. rate 13, height 6\' 2"  (1.88 m), weight (!) 155.5 kg, SpO2 93%.  Medical Problem List and Plan: 1. Functional deficits secondary to acute ischemic left cerebellar infarct with hx of prior CVA             -patient may shower, please cover RLE skin breakdown             -ELOS/Goals: 16 to 20 days, PT OT supervision to min assist, SLP supervision MRI            -Continue CIR therapies including PT, OT. Could he benefit from knee cages?  -Expected discharge 12/4-patient excited about discharge planned  2.  Antithrombotics: -DVT/anticoagulation:  Pharmaceutical: Eliquis             -antiplatelet therapy: N/A 3. Chronic pain/Pain Management: Has spinal cord stimulator. Continue Tylenol prn.   --will d/c hydrocodone and monitor as denies pain at this time.  --continue robaxin prn  4. Mood/Behavior/Sleep: LCSW to follow for evaluation and support.              -antipsychotic agents: Abilify-->will change to bedtime as at home.              --chronic insomnia was being managed with Trazodone and Remeron--> d/c per  neurology --has PTSD/Nightmares but sleeping well in hospital. Resume Prasozin 2 mg/HS if BP stable.  -11/26 will restart trazodone at lower dose of 50 mg nightly -11/28 increase melatonin to 10 mg  -12/3 insomnia continues to be improved, continue current regimen 5. Neuropsych/cognition: This patient is capable of making decisions on his own behalf. 6. Skin/Wound Care: Routine pressure relief measures.              -Bilateral lower  extremity venous stasis ulcers.  Continue current wound care with Aquacel and Xeroform 7. Fluids/Electrolytes/Nutrition: Monitor I/O. Check CMET in am. Recheck Mg and Phos level in am8.    8. HTN: BP stable. Resume Prazosin when able  --Wide complex tachycardia: No significant arrhthymias seen per Dr. Tenny Craw.              --metoprolol 12.5 mg bid and can be increased to 25 mg bid.   -11/22 metoprolol was discontinued due to bradycardia  -11-23: Normotensive, mild intermittent bradycardia, appreciate cardiology assistance and medication titration  -11/26 BP soft but stable continue current regimen  -Toprol-XL 25 mg daily per cardiology  -12/3 BP well-controlled, continue to monitor     07/11/2023   12:57 PM 07/11/2023    8:25 AM 07/11/2023    7:45 AM  Vitals with BMI  Systolic 129  119  Diastolic 83  70  Pulse 75 78 75     9. T2DM with peripheral neuropathy: Hgb A1C-6.5 and well controlled. Monitor BS ac/hs and use SSI for elevated BS.              --continue insulin glargine 40 units daily with novolog 12 units TID  -11/22 fair control, continue to monitor for now  11-27 well controlled decrease glargine to 36 units to reduce risk of hypoglycemia  11/28 decrease NovoLog to 10 units 3 times daily and glargine to 34 units to avoid risk of hypoglycemia  11/30 decrease Novolog to 8 units TID to avoid hypoglycemia  12/3 CBGs well-controlled, continue current regimen  CBG (last 3)  Recent Labs    07/10/23 2111 07/11/23 0606 07/11/23 1125  GLUCAP 113*  105* 153*    10. Chronic diastolic CHF: Added 1500 cc FR per recommendations. HH diet  --Daily weight and monitor for signs of overload--weight at admission 11/16- 373 lbs.  --evaluated by cards today-->BB increased to 25 mg BID, Demadex bid. Zaroxolyn X 1 dose --cards will follow for response in am.   11/22 cardiology following-appreciate assistance, cardiology holding off on spironolactone due to dizziness 11-23: No new weights overnight, no apparent fluid overload, continue current regimen 11/24: weight uptrending mildly from 11-22, no external signs of worsening volume overload, chest x-ray clear.  Per cardiology, could consider IV Lasix if additional diuresis needed, will defer at this time given medication changes below. 11/26 per cardiology continue torsemide 120 mg twice daily 11/27 wt trending down, off O2 [place], continue current 11/28 weight appears overall stable, tolerating room air continue to monitor 12/1 wt stable, continue current medication, stable on RA, episode of chest pain last night, troponin-neg, EKG with 1st degree block. Resolved with NTG. Continue to monitor   12/2 weights trending up a bit again, but he was -2.3 L yesterday and 20L for admit  -monitor only for now 12/3 difficult to assess his fluid status but does not appear fluid overloaded, continue torsemide current dose Filed Weights   07/07/23 0500 07/09/23 0435 07/10/23 0311  Weight: (!) 153.4 kg (!) 154.9 kg (!) 155.5 kg    11. Wide complex tachycardia/hx PAF/bradycardia/PVCs: Started metoprolol 25mg  BID by cardiology. On Eliquis  -11/22 Zio started by cardiology, metoprolol was stopped.  Cardiology feels that pulse may be undercalled due to PVCs.  11-24: Per recommendation by EP, resumed metoprolol 12.5 mg twice daily despite ongoing bradycardia.  Nursing informed not to hold medication for heart rate.  Unfortunately, unable to interrogate Zio patch.  Appreciate cardiology input in this case.  -11/25 cardiology  has seen him today, will monitor for recommendations, zio patch in place  12/3 this afternoon patient was reporting some dizziness and fatigue with activity.  EKG stable with sinus rhythm and first-degree AV block.  Continue to monitor  12. Seizure d/o: Has been seizure free on Keppra and Vimpat -->followed by Dr. Teresa Coombs.  13. Major depressive: PTA--was on Effexor, Abilify, Remeron and Trazodone.              --lethargy could be due to daytime Abilify-->will change to nights             --Remeron and trazodone on hold per neuro  -Continue melatonin 14. GAD w/panic attacks/PTSD: Chronic issues onoging and followed by [facility].  --Was on Prazosin and Buspar.             --Mood stable on current dose Buspar. Monitor for now  15. Hypokalemia: Improved with supplementation.  -11/21 potassium lower today at 3.1, will change potassium supplement to 20 mEq twice daily.  Recheck tomorrow  -11/22 K+ up to 3.2, increase to in am and in evening  -11/22 potassium supplement increased to 40 mEq twice daily; repeat on Monday  -11/25 potassium up to 3.4 continue supplementation  11/21 up to 4.1 this AM, continue current regimen and monitor   12/1 K+ 3.6 today, continue Klor con M TID   12/3 potassium stable at 3.7, continue current regimen.  Will need outpatient follow-up to recheck potassium 16. Leucocytosis: WBC trending down-->monitor  -11/25 within normal limits 17. OSA: Now compliant with CPAP but has been requiring 5 L oxygen per [place].              --question OHS as contributing factor leading to hypersomnia. Wean oxygen as able.   -Appears to be tolerating weaning down nasal O2, he reports he used his CPAP at night  07-01-23: Compliant with CPAP, stable on 2 L nasal cannula  12/2 remains compliant with CPAP  18. COPD: On Trilegy at home -->on Breo/[place]  Continue to wean off oxygen.   19. Neuropathy likely related to diabetes mellitus type 2: Managed with spinal cord stimulator and  Lyrica (Dr. Beverly Gust Shaw/[facility]).   -11-23: Patient complaining of worsening symptoms, especially at night.  However, appears already on max dose Lyrica and venlafaxine.  Defer further medication adjustments to primary team.   11/30 pain controlled, reports robaxin helping   12/3 discussed with patient could consider Qutenza as an outpatient  20. Gout flare R great toe: On colchicine bid for 5 more days, resume allopurinol in 2 to 3 weeks  -11/25 colchicine was decreased to daily, discontinue after 1 more day 21. CKD 3A:BUN/Scr-38/1.31 at admission.   -Creatinine a little higher today at 1.41/BUN 32, patient is on diuretics per cardiology  -11/26 torsemide was held yesterday, creatinine down to 1.28  -11/27 hold torsemide this evening, Cr up to 1.44  11/28 creatinine improved to 1.2, BUN 24.  As torsemide 120 mg twice daily  12/1 Cr/BUN down to 1.17/16 today, recheck Tuesday  12/3 BUN/creatinine stable at 18/1.1 22. Morbid Obesity. Body mass index is 47.89 kg/m.             -Dietary counseling 23. Constipation   -12/1 miralax ordered, Senokot HS. PRN sorbitol 30ml  -LBM 12/2-improved  LOS: 13 days A FACE TO FACE EVALUATION WAS PERFORMED  Fanny Dance 07/11/2023, 1:36 PM

## 2023-07-11 NOTE — Progress Notes (Signed)
Patient stated he was feeling dizzy and soon after he felt "fuzzy". Reassessed no change from prior assessment. Vital signs WNL. Notified Delle Reining, [place]. New orders placed. PA notified of results.    Rickey Farrier Colt Martelle,LPN

## 2023-07-11 NOTE — Plan of Care (Signed)
  Problem: RH Balance Goal: LTG Patient will maintain dynamic sitting balance (PT) Description: LTG:  Patient will maintain dynamic sitting balance with assistance during mobility activities (PT) Outcome: Completed/Met Goal: LTG Patient will maintain dynamic standing balance (PT) Description: LTG:  Patient will maintain dynamic standing balance with assistance during mobility activities (PT) Outcome: Completed/Met   Problem: Sit to Stand Goal: LTG:  Patient will perform sit to stand with assistance level (PT) Description: LTG:  Patient will perform sit to stand with assistance level (PT) Outcome: Completed/Met   Problem: RH Bed Mobility Goal: LTG Patient will perform bed mobility with assist (PT) Description: LTG: Patient will perform bed mobility with assistance, with/without cues (PT). Outcome: Completed/Met   Problem: RH Bed to Chair Transfers Goal: LTG Patient will perform bed/chair transfers w/assist (PT) Description: LTG: Patient will perform bed to chair transfers with assistance (PT). Outcome: Completed/Met   Problem: RH Furniture Transfers Goal: LTG Patient will perform furniture transfers w/assist (OT/PT) Description: LTG: Patient will perform furniture transfers  with assistance (OT/PT). Outcome: Completed/Met   Problem: RH Ambulation Goal: LTG Patient will ambulate in controlled environment (PT) Description: LTG: Patient will ambulate in a controlled environment, # of feet with assistance (PT). Outcome: Completed/Met Goal: LTG Patient will ambulate in home environment (PT) Description: LTG: Patient will ambulate in home environment, # of feet with assistance (PT). Outcome: Completed/Met

## 2023-07-11 NOTE — Progress Notes (Signed)
Occupational Therapy Session Note  Patient Details  Name: Lawrence [name] MRN: [id number] Date of Birth: 05/24/1956  Today's Date: 07/11/2023 OT Individual Time: 0900-1010 OT Individual Time Calculation (min): 70 min   Today's Date: 07/11/2023 OT Missed Time: 60 Minutes Missed Time Reason: Patient fatigue;Patient ill (comment) (Orthostatic symptoms)  Short Term Goals: Week 2:  OT Short Term Goal 1 (Week 2): STGs=LTGs due to patient's estimated length of stay.  Skilled Therapeutic Interventions/Progress Updates:   Session 1: Pt greeted resting in bed for skilled OT session with focus on BADL participation.    Pain: Pt pain noted above, OT offering intermediate rest breaks and positioning suggestions throughout session to address pain/fatigue and maximize participation/safety in session.    Functional Transfers: Pt ambulates into/out bathroom with close supervision + rollator. Walk-in shower transfer with use of BSC instead of TTB with same of assistance + use of grab bar. STS at supervision level with increased time to power up into standing.    Self Care Tasks: Full-body bathing at shower level with setup/distant supervision. Use of long-handled sponge and lateral leans for posterior care. Unofficial caregiver education completed with significant other, Lawrence [name], discussion focused current level of functioning, overall supervision level of assistance needed, and energy conservation techniques for fall prevention. Both parties receptive to conversation.    Pt remained sitting in recliner with 4Ps assessed and immediate needs met. Pt continues to be appropriate for skilled OT intervention to promote further functional independence in ADLs/IADLs.   Session 2: Pt greeted resting in bed with LPN present for wound care. Pt declines session due to increased fatigue and orthostatic symptoms, 60 mins of skilled OT missed.   Therapy Documentation Precautions:  Precautions Precautions:  Fall Precaution Comments: Slight L hemiparesis Required Braces or Orthoses: Other Brace Other Brace: R Neoprene Hinged Knee Brace for pain management and structual alignment. Restrictions Weight Bearing Restrictions: No  Therapy/Group: Individual Therapy  Lou Cal, OTR/L, MSOT  07/11/2023, 12:27 PM

## 2023-07-11 NOTE — Progress Notes (Signed)
Erroneous encounter

## 2023-07-11 NOTE — Plan of Care (Signed)
  Problem: RH Balance Goal: LTG: Patient will maintain dynamic sitting balance (OT) Description: LTG:  Patient will maintain dynamic sitting balance with assistance during activities of daily living (OT) Outcome: Completed/Met Goal: LTG Patient will maintain dynamic standing with ADLs (OT) Description: LTG:  Patient will maintain dynamic standing balance with assist during activities of daily living (OT)  Outcome: Completed/Met   Problem: Sit to Stand Goal: LTG:  Patient will perform sit to stand in prep for activites of daily living with assistance level (OT) Description: LTG:  Patient will perform sit to stand in prep for activites of daily living with assistance level (OT) Outcome: Completed/Met   Problem: RH Grooming Goal: LTG Patient will perform grooming w/assist,cues/equip (OT) Description: LTG: Patient will perform grooming with assist, with/without cues using equipment (OT) Outcome: Completed/Met   Problem: RH Bathing Goal: LTG Patient will bathe all body parts with assist levels (OT) Description: LTG: Patient will bathe all body parts with assist levels (OT) Outcome: Completed/Met   Problem: RH Dressing Goal: LTG Patient will perform upper body dressing (OT) Description: LTG Patient will perform upper body dressing with assist, with/without cues (OT). Outcome: Completed/Met Goal: LTG Patient will perform lower body dressing w/assist (OT) Description: LTG: Patient will perform lower body dressing with assist, with/without cues in positioning using equipment (OT) Outcome: Completed/Met   Problem: RH Toileting Goal: LTG Patient will perform toileting task (3/3 steps) with assistance level (OT) Description: LTG: Patient will perform toileting task (3/3 steps) with assistance level (OT)  Outcome: Completed/Met   Problem: RH Functional Use of Upper Extremity Goal: LTG Patient will use RT/LT upper extremity as a (OT) Description: LTG: Patient will use right/left upper  extremity as a stabilizer/gross assist/diminished/nondominant/dominant level with assist, with/without cues during functional activity (OT) Outcome: Completed/Met   Problem: RH Toilet Transfers Goal: LTG Patient will perform toilet transfers w/assist (OT) Description: LTG: Patient will perform toilet transfers with assist, with/without cues using equipment (OT) Outcome: Completed/Met

## 2023-07-12 ENCOUNTER — Other Ambulatory Visit: Payer: Self-pay | Admitting: "Endocrinology

## 2023-07-12 ENCOUNTER — Other Ambulatory Visit (HOSPITAL_COMMUNITY): Payer: Self-pay

## 2023-07-12 ENCOUNTER — Ambulatory Visit: Payer: Medicare HMO | Admitting: Adult Health

## 2023-07-12 DIAGNOSIS — M109 Gout, unspecified: Secondary | ICD-10-CM

## 2023-07-12 LAB — GLUCOSE, CAPILLARY
Glucose-Capillary: 176 mg/dL — ABNORMAL HIGH (ref 70–99)
Glucose-Capillary: 78 mg/dL (ref 70–99)

## 2023-07-12 MED ORDER — TORSEMIDE 20 MG PO TABS: 120.0000 mg | ORAL_TABLET | Freq: Two times a day (BID) | ORAL | Status: DC

## 2023-07-12 MED ORDER — COLCHICINE 0.6 MG PO TABS
0.6000 mg | ORAL_TABLET | Freq: Every day | ORAL | 0 refills | Status: AC
  Filled 2023-07-12: qty 30, 30d supply, fill #0

## 2023-07-12 MED ORDER — INSULIN PEN NEEDLE 32G X 4 MM MISC
0 refills | Status: DC
Start: 2023-07-12 — End: 2023-07-22
  Filled 2023-07-12: qty 100, 33d supply, fill #0

## 2023-07-12 MED ORDER — ROSUVASTATIN CALCIUM 40 MG PO TABS
40.0000 mg | ORAL_TABLET | Freq: Every day | ORAL | 0 refills | Status: DC
  Filled 2023-07-12: qty 30, 30d supply, fill #0

## 2023-07-12 MED ORDER — ALLOPURINOL 100 MG PO TABS: ORAL_TABLET | ORAL | Status: DC

## 2023-07-12 NOTE — Plan of Care (Signed)
  Problem: Education: Goal: Ability to describe self-care measures that may prevent or decrease complications (Diabetes Survival Skills Education) will improve 07/12/2023 1258 by Elgie Congo, LPN Outcome: Adequate for Discharge 07/12/2023 1258 by Elgie Congo, LPN Outcome: Adequate for Discharge 07/12/2023 1258 by Elgie Congo, LPN Outcome: Progressing Goal: Individualized Educational Video(s) 07/12/2023 1258 by Elgie Congo, LPN Outcome: Adequate for Discharge 07/12/2023 1258 by Elgie Congo, LPN Outcome: Adequate for Discharge 07/12/2023 1258 by Tilden Dome A, LPN Outcome: Progressing   Problem: Coping: Goal: Ability to adjust to condition or change in health will improve 07/12/2023 1258 by Elgie Congo, LPN Outcome: Adequate for Discharge 07/12/2023 1258 by Elgie Congo, LPN Outcome: Adequate for Discharge 07/12/2023 1258 by Tilden Dome A, LPN Outcome: Progressing   Problem: Fluid Volume: Goal: Ability to maintain a balanced intake and output will improve 07/12/2023 1258 by Elgie Congo, LPN Outcome: Adequate for Discharge 07/12/2023 1258 by Elgie Congo, LPN Outcome: Adequate for Discharge 07/12/2023 1258 by Elgie Congo, LPN Outcome: Progressing   Problem: Health Behavior/Discharge Planning: Goal: Ability to identify and utilize available resources and services will improve 07/12/2023 1258 by Elgie Congo, LPN Outcome: Adequate for Discharge 07/12/2023 1258 by Elgie Congo, LPN Outcome: Adequate for Discharge 07/12/2023 1258 by Tilden Dome A, LPN Outcome: Progressing Goal: Ability to manage health-related needs will improve 07/12/2023 1258 by Elgie Congo, LPN Outcome: Adequate for Discharge 07/12/2023 1258 by Elgie Congo, LPN Outcome: Adequate for Discharge 07/12/2023 1258 by Tilden Dome A, LPN Outcome: Progressing   Problem: Metabolic: Goal: Ability to maintain appropriate glucose levels will improve 07/12/2023 1258 by  Elgie Congo, LPN Outcome: Adequate for Discharge 07/12/2023 1258 by Elgie Congo, LPN Outcome: Adequate for Discharge 07/12/2023 1258 by Tilden Dome A, LPN Outcome: Progressing   Problem: Nutritional: Goal: Maintenance of adequate nutrition will improve 07/12/2023 1258 by Elgie Congo, LPN Outcome: Adequate for Discharge 07/12/2023 1258 by Elgie Congo, LPN Outcome: Adequate for Discharge 07/12/2023 1258 by Tilden Dome A, LPN Outcome: Progressing Goal: Progress toward achieving an optimal weight will improve 07/12/2023 1258 by Elgie Congo, LPN Outcome: Adequate for Discharge 07/12/2023 1258 by Elgie Congo, LPN Outcome: Adequate for Discharge 07/12/2023 1258 by Tilden Dome A, LPN Outcome: Progressing   Problem: Skin Integrity: Goal: Risk for impaired skin integrity will decrease 07/12/2023 1258 by Tilden Dome A, LPN Outcome: Adequate for Discharge 07/12/2023 1258 by Elgie Congo, LPN Outcome: Adequate for Discharge 07/12/2023 1258 by Tilden Dome A, LPN Outcome: Progressing   Problem: Tissue Perfusion: Goal: Adequacy of tissue perfusion will improve 07/12/2023 1258 by Elgie Congo, LPN Outcome: Adequate for Discharge 07/12/2023 1258 by Elgie Congo, LPN Outcome: Adequate for Discharge 07/12/2023 1258 by Elgie Congo, LPN Outcome: Progressing

## 2023-07-12 NOTE — Discharge Summary (Signed)
Physician Discharge Summary  Patient ID: Lawrence [name] MRN: [id number] DOB/AGE: 1956-06-03 67 y.o.  Admit date: 06/28/2023 Discharge date: 07/12/2023  Discharge Diagnoses:  Principal Problem:   Acute stroke due to occlusion of left cerebellar artery (HCC) Active Problems:   Diabetes mellitus (HCC)   Primary hypertension   Type 2 diabetes mellitus with diabetic polyneuropathy, with long-term current use of insulin (HCC)   Stage 3a chronic kidney disease (HCC)   AKI (acute kidney injury) (HCC)   Orthostatic hypotension   Polyneuropathy   Cognitive and neurobehavioral dysfunction   Constipation   Gout   Discharged Condition: {condition:18240}  Significant Diagnostic Studies: DG CHEST PORT 1 VIEW  Result Date: 07/02/2023 CLINICAL DATA:  141880 SOB (shortness of breath) 141880 EXAM: PORTABLE CHEST 1 VIEW COMPARISON:  June 29, 2023 FINDINGS: The cardiomediastinal silhouette is unchanged and enlarged in contour. No pleural effusion. No pneumothorax. No acute pleuroparenchymal abnormality. IMPRESSION: No acute cardiopulmonary abnormality. Electronically Signed   By: Meda Klinefelter M.D.   On: 07/02/2023 16:23   DG Chest 2 View  Result Date: 06/29/2023 CLINICAL DATA:  Hypoxia. EXAM: CHEST - 2 VIEW COMPARISON:  Chest radiograph dated 06/24/2023. FINDINGS: Cardiomegaly with vascular congestion. No focal consolidation, pleural effusion, pneumothorax. Aortic valve repair. Degenerative changes of the spine. No acute osseous pathology. IMPRESSION: Cardiomegaly with vascular congestion.  No focal consolidation. Electronically Signed   By: Elgie Collard M.D.   On: 06/29/2023 18:57   ECHOCARDIOGRAM COMPLETE BUBBLE STUDY  Addendum Date: 06/26/2023   Status post 34 mm TAVR Medtronic evolute FX valve (02/07/2023), at the aortic position, grossly well-seated, no perivalvular leak, no valvular regurgitation, either mild valvular stenosis versus prosthesis patient mismatch (peak velocity  2.7 m/s, peak gradient 29 mmHg, mean gradient 16 mmHg, dimensional index 0.44, EOA 1.11 cm, BSA 2.8 m2, acceleration time 82 ms. Clinical correlation required. [employer] Electronically Amended 06/26/2023, 5:34 PM   Final (Amended)    Result Date: 06/26/2023    ECHOCARDIOGRAM REPORT   Patient Name:   Lawrence [name] Date of Exam: 06/26/2023 Medical Rec #:  [id number]        Height:       74.0 in Accession #:    [id number]       Weight:       373.0 lb Date of Birth:  15-Nov-1955        BSA:          2.833 m Patient Age:    67 years         BP:           112/74 mmHg Patient Gender: M                HR:           79 bpm. Exam Location:  Inpatient Procedure: 2D Echo, Cardiac Doppler, Color Doppler and Saline Contrast Bubble            Study Indications:    Stroke I63.9  History:        Patient has prior history of Echocardiogram examinations, most                 recent 03/10/2023. CHF, CAD, Stroke and COPD, Arrythmias:Atrial                 Fibrillation; Risk Factors:Hypertension, Dyslipidemia and Former                 Smoker.  Sonographer:    Dondra Prader RVT RCS  Referring Phys: [id number] DEBBY CROSLEY  Sonographer Comments: Technically difficult study due to poor echo windows, suboptimal apical window and suboptimal subcostal window. Image acquisition challenging due to COPD and Image acquisition challenging due to patient body habitus. IMPRESSIONS  1. Technically difficult study.  2. Left ventricular ejection fraction, by estimation, is 50 to 55%. The left ventricle has low normal function. Left ventricular endocardial border not optimally defined to evaluate regional wall motion. The left ventricular internal cavity size was dilated. Left ventricular diastolic function could not be evaluated.  3. Right ventricular systolic function is normal. The right ventricular size is normal. Mildly increased right ventricular wall thickness.  4. There is no evidence of cardiac tamponade.  5. The mitral valve is grossly normal. No  evidence of mitral valve regurgitation. No evidence of mitral stenosis.  6. The aortic valve was not well visualized. Aortic valve regurgitation is not visualized. But based on doppler parameter cannot rule out mild (per max velocity and MG) to moderate aortic stenosis (dimensional index).  7. Aortic ascending aorta is structurally normal, with no evidence of dilitation.  8. Agitated saline contrast bubble study was inconclusive. Conclusion(s)/Recommendation(s): No intracardiac source of embolism detected on this transthoracic study. Consider a transesophageal echocardiogram to exclude cardiac source of embolism if clinically indicated. FINDINGS  Left Ventricle: Left ventricular ejection fraction, by estimation, is 50 to 55%. The left ventricle has low normal function. Left ventricular endocardial border not optimally defined to evaluate regional wall motion. The left ventricular internal cavity  size was dilated. There is borderline left ventricular hypertrophy. Left ventricular diastolic function could not be evaluated. Right Ventricle: The right ventricular size is normal. Mildly increased right ventricular wall thickness. Right ventricular systolic function is normal. Left Atrium: Left atrial size was normal in size. Right Atrium: Right atrial size was normal in size. Pericardium: Trivial pericardial effusion is present. There is no evidence of cardiac tamponade. Mitral Valve: The mitral valve is grossly normal. No evidence of mitral valve regurgitation. No evidence of mitral valve stenosis. Tricuspid Valve: The tricuspid valve is grossly normal. Tricuspid valve regurgitation is not demonstrated. No evidence of tricuspid stenosis. Aortic Valve: The aortic valve was not well visualized. Aortic valve regurgitation is not visualized. But based on doppler parameter cannot rule out mild (per max velocity and MG) to moderate aortic stenosis (dimensional index). Aortic valve mean gradient measures 15.7 mmHg. Aortic  valve peak gradient measures 29.4 mmHg. Aortic valve area, by VTI measures 1.11 cm. Pulmonic Valve: The pulmonic valve was not well visualized. Pulmonic valve regurgitation is not visualized. No evidence of pulmonic stenosis. Aorta: Aortic root could not be assessed and ascending aorta is structurally normal, with no evidence of dilitation. IAS/Shunts: The interatrial septum was not assessed. Agitated saline contrast was given intravenously to evaluate for intracardiac shunting. Agitated saline contrast bubble study was inconclusive.  LEFT VENTRICLE PLAX 2D LVIDd:         6.30 cm LVIDs:         4.60 cm LV PW:         1.30 cm LV IVS:        1.10 cm LVOT diam:     1.80 cm LV SV:         58 LV SV Index:   21 LVOT Area:     2.54 cm  RIGHT VENTRICLE             IVC RV S prime:     16.20 cm/s  IVC diam: 3.30  cm LEFT ATRIUM             Index        RIGHT ATRIUM           Index LA diam:        4.80 cm 1.69 cm/m   RA Area:     17.10 cm LA Vol (A2C):   62.1 ml 21.92 ml/m  RA Volume:   43.60 ml  15.39 ml/m LA Vol (A4C):   77.2 ml 27.25 ml/m LA Biplane Vol: 70.8 ml 24.99 ml/m  AORTIC VALVE                     PULMONIC VALVE AV Area (Vmax):    1.01 cm      PV Vmax:       1.23 m/s AV Area (Vmean):   1.09 cm      PV Peak grad:  6.1 mmHg AV Area (VTI):     1.11 cm AV Vmax:           271.33 cm/s AV Vmean:          181.000 cm/s AV VTI:            0.524 m AV Peak Grad:      29.4 mmHg AV Mean Grad:      15.7 mmHg LVOT Vmax:         107.42 cm/s LVOT Vmean:        77.260 cm/s LVOT VTI:          0.229 m LVOT/AV VTI ratio: 0.44  AORTA Ao Asc diam: 3.80 cm MITRAL VALVE MV Area (PHT): 4.39 cm    SHUNTS MV Decel Time: 173 msec    Systemic VTI:  0.23 m MV E velocity: 85.60 cm/s  Systemic Diam: 1.80 cm MV A velocity: 78.00 cm/s MV E/A ratio:  1.10 [employer] Electronically signed by Tessa Lerner Signature Date/Time: 06/26/2023/11:37:34 AM    Final (Amended)    MR BRAIN WO CONTRAST  Result Date: 06/25/2023 CLINICAL DATA:   Initial evaluation for neuro deficit, stroke suspected. EXAM: MRI HEAD WITHOUT CONTRAST TECHNIQUE: Multiplanar, multiecho pulse sequences of the brain and surrounding structures were obtained without intravenous contrast. COMPARISON:  Prior CTs from 06/24/2023. FINDINGS: Brain: Mild age-related cerebral volume loss. Few scattered subcentimeter foci of T2/FLAIR signal intensity seen involving the supratentorial cerebral white matter, most like related chronic microvascular ischemic disease, less than is typically seen for patient age. 5 mm focus of restricted diffusion seen involving the left cerebellum (series 5, image 57), consistent with a small acute ischemic nonhemorrhagic infarct. No other evidence for acute or subacute ischemia elsewhere. No acute or chronic intracranial blood products. No mass lesion, midline shift or mass effect. No hydrocephalus or extra-axial fluid collection. Pituitary gland suprasellar region within normal limits. Vascular: Major intracranial vascular flow voids are maintained. Skull and upper cervical spine: Craniocervical junction within normal limits. Bone marrow signal intensity normal. No scalp soft tissue abnormality. Sinuses/Orbits: Globes and orbital soft tissues within normal limits. Mild mucosal thickening present about the sphenoid ethmoidal sinuses. No mastoid effusion. Other: None. IMPRESSION: 1. 5 mm acute ischemic nonhemorrhagic left cerebellar infarct. 2. Underlying mild age-related cerebral volume loss with mild chronic microvascular ischemic disease. Electronically Signed   By: Rise Mu M.D.   On: 06/25/2023 01:14   DG Chest Portable 1 View  Result Date: 06/24/2023 CLINICAL DATA:  Altered level of consciousness EXAM: PORTABLE CHEST 1 VIEW COMPARISON:  05/15/2023 FINDINGS: Single  frontal view of the chest demonstrates a stable cardiac silhouette. Chronic scarring at the lung bases. No acute airspace disease, effusion, or pneumothorax. No acute bony  abnormalities. IMPRESSION: 1. Chronic bibasilar scarring.  No acute airspace disease. Electronically Signed   By: Sharlet Salina M.D.   On: 06/24/2023 22:52   CT ANGIO HEAD NECK W WO CM  Result Date: 06/24/2023 CLINICAL DATA:  Stroke/TIA. Determine embolic source. Left-sided facial droop. EXAM: CT ANGIOGRAPHY HEAD AND NECK WITH AND WITHOUT CONTRAST TECHNIQUE: Multidetector CT imaging of the head and neck was performed using the standard protocol during bolus administration of intravenous contrast. Multiplanar CT image reconstructions and MIPs were obtained to evaluate the vascular anatomy. Carotid stenosis measurements (when applicable) are obtained utilizing NASCET criteria, using the distal internal carotid diameter as the denominator. RADIATION DOSE REDUCTION: This exam was performed according to the departmental dose-optimization program which includes automated exposure control, adjustment of the mA and/or kV according to patient size and/or use of iterative reconstruction technique. CONTRAST:  75mL OMNIPAQUE IOHEXOL 350 MG/ML SOLN COMPARISON:  CT head without contrast 06/24/2023. CT angio head and neck 05/07/2023. FINDINGS: CTA NECK FINDINGS Aortic arch: Atherosclerotic calcifications are present at the aortic arch without significant stenosis relative to the distal vessel. A scratched at the left common carotid artery shares a common origin with the innominate artery. Right carotid system: The right common carotid artery is within normal limits. Atherosclerotic calcifications are present at the bifurcation without significant stenosis. Mild tortuosity is present in the cervical left ICA. Left carotid system: The left common carotid artery is within normal limits. Atherosclerotic calcifications are present at the bifurcation. No significant stenosis is present. Mild tortuosity is present the cervical left ICA. Vertebral arteries: The left vertebral artery is the dominant vessel. Both vertebral arteries  originate from the subclavian arteries without significant stenosis. Significant stenosis is present either vertebral artery in. Skeleton: Neck level degenerative changes are again noted in the cervical spine. No focal osseous lesions are present. Other neck: Soft tissues the neck are otherwise unremarkable. Salivary glands are within normal limits. Thyroid is normal. No significant adenopathy is present. No focal mucosal or submucosal lesions are present. Upper chest: Mild dependent atelectasis is present in the lungs bilaterally. Review of the MIP images confirms the above findings CTA HEAD FINDINGS Anterior circulation: The internal carotid arteries are within normal limits from the skull base to the ICA termini. The A1 and M1 segments are normal. The anterior communicating artery is patent. MCA bifurcations are normal. The ACA and MCA branch vessels are within normal limits. No aneurysm is present. Posterior circulation: Left vertebral artery is dominant vessel. The PICA origins are visualized and normal. The vertebrobasilar junction and basilar artery are normal. The superior cerebellar arteries are patent. Both posterior cerebral arteries originate from basilar tip. PCA branch vessels are normal bilaterally. Venous sinuses: The dural sinuses are patent. The straight sinus and deep cerebral veins are intact. Cortical veins are within normal limits. No significant vascular malformation is evident. Anatomic variants: None Review of the MIP images confirms the above findings IMPRESSION: 1. No emergent large vessel occlusion. 2. Atherosclerotic changes at the carotid bifurcations bilaterally without significant stenosis. 3. Mild tortuosity of the cervical internal carotid arteries bilaterally likely reflects chronic hypertension. 4. Degenerative changes in the cervical spine. 5.  Aortic Atherosclerosis (ICD10-I70.0). Electronically Signed   By: Marin Roberts M.D.   On: 06/24/2023 21:33   CT HEAD CODE  STROKE WO CONTRAST  Result Date: 06/24/2023 CLINICAL  DATA:  Code stroke.  Initial evaluation for acute stroke. EXAM: CT HEAD WITHOUT CONTRAST TECHNIQUE: Contiguous axial images were obtained from the base of the skull through the vertex without intravenous contrast. RADIATION DOSE REDUCTION: This exam was performed according to the departmental dose-optimization program which includes automated exposure control, adjustment of the mA and/or kV according to patient size and/or use of iterative reconstruction technique. COMPARISON:  Prior study from 05/07/2023 FINDINGS: Brain: Cerebral volume within normal limits. Mild chronic microvascular ischemic disease. No acute intracranial hemorrhage. No acute large vessel territory infarct. No mass lesion or midline shift. No hydrocephalus or extra-axial fluid collection. Vascular: No abnormal hyperdense vessel. Skull: Scalp soft tissues within normal limits.  Calvarium intact. Sinuses/Orbits: Globes and orbital soft tissues within normal limits. Mild mucosal thickening about the ethmoidal air cells. Paranasal sinuses are otherwise clear. No significant mastoid effusion. Other: None. ASPECTS [facility] Stroke Program Early CT Score) - Ganglionic level infarction (caudate, lentiform nuclei, internal capsule, insula, M1-M3 cortex): 7 - Supraganglionic infarction (M4-M6 cortex): 3 Total score (0-10 with 10 being normal): 10 IMPRESSION: 1. No acute intracranial abnormality. 2. Aspects is 10. 3. Mild chronic microvascular ischemic disease. These results were communicated to Dr. Wilford Corner at 9:14 pm on 06/24/2023 by text page via the [facility] messaging system. Electronically Signed   By: Rise Mu M.D.   On: 06/24/2023 21:15    Labs:  Basic Metabolic Panel: Recent Labs  Lab 07/06/23 0602 07/07/23 0412 07/08/23 2319 07/09/23 0530 07/11/23 0453  NA 132* 138 138 138 140  K 4.0 4.1 3.8 3.6 3.7  CL 99 102 101 105 104  CO2 22 27 28 26 27   GLUCOSE 102* 102* 104* 135*  109*  BUN 24* 24* 21 19 18   CREATININE 1.20 1.21 1.21 1.17 1.10  CALCIUM 9.1 8.8* 8.9 8.9 8.9    CBC: Recent Labs  Lab 07/06/23 0602 07/08/23 2319 07/11/23 0453  WBC 8.9 9.6 10.2  HGB 14.2 14.1 13.9  HCT 43.5 43.3 43.5  MCV 88.8 89.6 89.9  PLT 272 247 246    CBG: Recent Labs  Lab 07/11/23 0606 07/11/23 1125 07/11/23 1628 07/11/23 2056 07/12/23 0616  GLUCAP 105* 153* 98 139* 176*    Brief HPI:   LAZARO FIORENTINO is a 67 y.o. male ***   Hospital Course: NIALL TIERRABLANCA was admitted to rehab 06/28/2023 for inpatient therapies to consist of PT, ST and OT at least three hours five days a week. Past admission physiatrist, therapy team and rehab RN have worked together to provide customized collaborative inpatient rehab.   Blood pressures were monitored on TID basis and   Diabetes has been monitored with ac/hs CBG checks and SSI was use prn for tighter BS control.    Rehab course: During patient's stay in rehab weekly team conferences were held to monitor patient's progress, set goals and discuss barriers to discharge. At admission, patient required  He/She  has had improvement in activity tolerance, balance, postural control as well as ability to compensate for deficits. He/She has had improvement in functional use RUE/LUE  and RLE/LLE as well as improvement in awareness       Disposition:  There are no questions and answers to display.         Diet:  Special Instructions:   Allergies as of 07/12/2023       Reactions   Vancomycin Other (See Comments)   "Red Man Syndrome" 02/02/17: possible cause for rash under both arms   Niacin And  Related Other (See Comments)   Red man syndrome   Tubersol [tuberculin, Ppd] Other (See Comments)   Reaction unknown   Doxycycline Rash, Other (See Comments)     Med Rec must be completed prior to using this SMART[employer]***       Follow-up Information     [name], PA-C Follow up.   Specialty: Family  Medicine Why: Call in 1-2 days for post hospital follow up Contact information: [address] [place] [address] [phone]         Fanny Dance, MD. Call.   Specialty: Physical Medicine and Rehabilitation Why: As needed Contact information: [address] [place] [address] [phone]         [facility] Follow up on 07/13/2023.   Specialty: Cardiology Why: 3:30PM. Need BMET on the same day. Follow up with Dr. Shirlee Latch or APP Contact information: [address] [place] [address] [phone]                Signed: Jacquelynn Cree 07/12/2023, 10:20 AM

## 2023-07-12 NOTE — Progress Notes (Signed)
PROGRESS NOTE   Subjective/Complaints: No new concerns this AM. Looking forward to DC home.   ROS: Patient denies fever, chills, malaise,  rash, sore throat, blurred vision, dizziness, nausea, vomiting, diarrhea, cough, shortness of breath or chest pain,  headache, or mood change.   + Chronic burning/sore pain in his feet     Objective:   No results found. Recent Labs    07/11/23 0453  WBC 10.2  HGB 13.9  HCT 43.5  PLT 246    Recent Labs    07/11/23 0453  NA 140  K 3.7  CL 104  CO2 27  GLUCOSE 109*  BUN 18  CREATININE 1.10  CALCIUM 8.9    Intake/Output Summary (Last 24 hours) at 07/12/2023 0955 Last data filed at 07/12/2023 0929 Gross per 24 hour  Intake 840 ml  Output 2225 ml  Net -1385 ml        Physical Exam: Vital Signs Blood pressure 131/85, pulse 78, temperature 97.6 F (36.4 C), resp. rate 16, height 6\' 2"  (1.88 m), weight (!) 155.5 kg, SpO2 93%.     Constitutional: No distress . Vital signs reviewed. Transferring with nursing assistance HEENT: NCAT, conjugate gaze, oral membranes moist Neck: supple Cardiovascular: RRR without murmur.  No MRG Respiratory/Chest: CTA Bilaterally without wheezes or rales. Normal effort.  On room air GI/Abdomen: BS +, non-tender, non-distended, soft Ext: no clubbing, cyanosis, trace bilateral lower extremity edema Psych: pleasant and cooperative  Skin: Dry dressing in place to  b/l ankles, with ACE, both loose  Neuro:  Mental Status: alert and awake,  memory intact, fund of knowledge appropriate. Fair insight,  No apparent deficits.  RUE: 5/5 Deltoid, 5/5 Biceps, 5/5 Triceps,5/5 Grip LUE: 4/5 Deltoid, 4/5 Biceps, 4/5 Triceps, 4/5 Grip RLE: HF 5/5, KE 5/5, ADF 5/5, APF 5/5 LLE: HF 2/5, KE 2/5, ADF 3/5, APF 3/5  SENSORY: Altered sensation to light touch in left upper extremity and left lower extremity, as well as bilateral feet and all toes. Minimal  tenderness b/l Feet and ankles - stable exam 12/3 Musc: genu recurvatum bilaterally in stance (pt says this is chronic)  Assessment/Plan: 1. Functional deficits which require 3+ hours per day of interdisciplinary therapy in a comprehensive inpatient rehab setting. Physiatrist is providing close team supervision and 24 hour management of active medical problems listed below. Physiatrist and rehab team continue to assess barriers to discharge/monitor patient progress toward functional and medical goals  Care Tool:  Bathing    Body parts bathed by patient: Right arm, Left arm, Chest, Abdomen, Front perineal area, Face, Right upper leg, Left upper leg, Right lower leg, Left lower leg, Buttocks   Body parts bathed by helper: Buttocks     Bathing assist Assist Level: Independent with assistive device     Upper Body Dressing/Undressing Upper body dressing   What is the patient wearing?: Pull over shirt    Upper body assist Assist Level: Independent    Lower Body Dressing/Undressing Lower body dressing      What is the patient wearing?: Underwear/pull up (Shorts)     Lower body assist Assist for lower body dressing: Supervision/Verbal cueing     Toileting Toileting  Toileting assist Assist for toileting: Supervision/Verbal cueing     Transfers Chair/bed transfer  Transfers assist     Chair/bed transfer assist level: Contact Guard/Touching assist     Locomotion Ambulation   Ambulation assist   Ambulation activity did not occur: Safety/medical concerns  Assist level: Contact Guard/Touching assist Assistive device: Rollator Max distance: 130 feet   Walk 10 feet activity   Assist  Walk 10 feet activity did not occur: Safety/medical concerns  Assist level: Contact Guard/Touching assist Assistive device: Rollator   Walk 50 feet activity   Assist Walk 50 feet with 2 turns activity did not occur: Safety/medical concerns  Assist level: Contact  Guard/Touching assist Assistive device: Rollator    Walk 150 feet activity   Assist Walk 150 feet activity did not occur: Safety/medical concerns         Walk 10 feet on uneven surface  activity   Assist Walk 10 feet on uneven surfaces activity did not occur: Safety/medical concerns         Wheelchair     Assist Is the patient using a wheelchair?: Yes Type of Wheelchair: Manual    Wheelchair assist level: Independent Max wheelchair distance: 150    Wheelchair 50 feet with 2 turns activity    Assist        Assist Level: Independent   Wheelchair 150 feet activity     Assist      Assist Level: Independent   Blood pressure 131/85, pulse 78, temperature 97.6 F (36.4 C), resp. rate 16, height 6\' 2"  (1.88 m), weight (!) 155.5 kg, SpO2 93%.  Medical Problem List and Plan: 1. Functional deficits secondary to acute ischemic left cerebellar infarct with hx of prior CVA             -patient may shower, please cover RLE skin breakdown             -ELOS/Goals: 16 to 20 days, PT OT supervision to min assist, SLP supervision MRI            -Continue CIR therapies including PT, OT. Could he benefit from knee cages?  -Expected discharge 12/4-Ok for DC home today  2.  Antithrombotics: -DVT/anticoagulation:  Pharmaceutical: Eliquis             -antiplatelet therapy: N/A 3. Chronic pain/Pain Management: Has spinal cord stimulator. Continue Tylenol prn.   --will d/c hydrocodone and monitor as denies pain at this time.  --continue robaxin prn  4. Mood/Behavior/Sleep: LCSW to follow for evaluation and support.              -antipsychotic agents: Abilify-->will change to bedtime as at home.              --chronic insomnia was being managed with Trazodone and Remeron--> d/c per neurology --has PTSD/Nightmares but sleeping well in hospital. Resume Prasozin 2 mg/HS if BP stable.  -11/26 will restart trazodone at lower dose of 50 mg nightly -11/28 increase melatonin  to 10 mg  -12/3 insomnia continues to be improved, continue current regimen 5. Neuropsych/cognition: This patient is capable of making decisions on his own behalf. 6. Skin/Wound Care: Routine pressure relief measures.              -Bilateral lower extremity venous stasis ulcers.  Continue current wound care with Aquacel and Xeroform 7. Fluids/Electrolytes/Nutrition: Monitor I/O. Check CMET in am. Recheck Mg and Phos level in am8.    8. HTN: BP stable. Resume Prazosin when able  --Wide complex  tachycardia: No significant arrhthymias seen per Dr. Tenny Craw.              --metoprolol 12.5 mg bid and can be increased to 25 mg bid.   -11/22 metoprolol was discontinued due to bradycardia  -11-23: Normotensive, mild intermittent bradycardia, appreciate cardiology assistance and medication titration  -11/26 BP soft but stable continue current regimen  -Toprol-XL 25 mg daily per cardiology  -12/4 well controlled, continue current      07/12/2023    8:17 AM 07/12/2023    4:27 AM 07/11/2023    7:49 PM  Vitals with BMI  Systolic 131 115 664  Diastolic 85 71 85  Pulse 78 73 73     9. T2DM with peripheral neuropathy: Hgb A1C-6.5 and well controlled. Monitor BS ac/hs and use SSI for elevated BS.              --continue insulin glargine 40 units daily with novolog 12 units TID  -11/22 fair control, continue to monitor for now  11-27 well controlled decrease glargine to 36 units to reduce risk of hypoglycemia  11/28 decrease NovoLog to 10 units 3 times daily and glargine to 34 units to avoid risk of hypoglycemia  11/30 decrease Novolog to 8 units TID to avoid hypoglycemia  12/4 CBGs controlled, continue current medications   CBG (last 3)  Recent Labs    07/11/23 1628 07/11/23 2056 07/12/23 0616  GLUCAP 98 139* 176*    10. Chronic diastolic CHF: Added 1500 cc FR per recommendations. HH diet  --Daily weight and monitor for signs of overload--weight at admission 11/16- 373 lbs.  --evaluated by  cards today-->BB increased to 25 mg BID, Demadex bid. Zaroxolyn X 1 dose --cards will follow for response in am.   11/22 cardiology following-appreciate assistance, cardiology holding off on spironolactone due to dizziness 11-23: No new weights overnight, no apparent fluid overload, continue current regimen 11/24: weight uptrending mildly from 11-22, no external signs of worsening volume overload, chest x-ray clear.  Per cardiology, could consider IV Lasix if additional diuresis needed, will defer at this time given medication changes below. 11/26 per cardiology continue torsemide 120 mg twice daily 11/27 wt trending down, off O2 [place], continue current 11/28 weight appears overall stable, tolerating room air continue to monitor 12/1 wt stable, continue current medication, stable on RA, episode of chest pain last night, troponin-neg, EKG with 1st degree block. Resolved with NTG. Continue to monitor   12/2 weights trending up a bit again, but he was -2.3 L yesterday and 20L for admit  -monitor only for now 12/3 difficult to assess his fluid status but does not appear fluid overloaded, continue torsemide current dose 12/4 f/u with cardiology outpatient Filed Weights   07/07/23 0500 07/09/23 0435 07/10/23 0311  Weight: (!) 153.4 kg (!) 154.9 kg (!) 155.5 kg    11. Wide complex tachycardia/hx PAF/bradycardia/PVCs: Started metoprolol 25mg  BID by cardiology. On Eliquis  -11/22 Zio started by cardiology, metoprolol was stopped.  Cardiology feels that pulse may be undercalled due to PVCs.  11-24: Per recommendation by EP, resumed metoprolol 12.5 mg twice daily despite ongoing bradycardia.  Nursing informed not to hold medication for heart rate.  Unfortunately, unable to interrogate Zio patch.  Appreciate cardiology input in this case.  -11/25 cardiology has seen him today, will monitor for recommendations, zio patch in place  12/3 this afternoon patient was reporting some dizziness and fatigue with  activity.  EKG stable with sinus rhythm and first-degree AV block.  Continue to monitor  12. Seizure d/o: Has been seizure free on Keppra and Vimpat -->followed by Dr. Teresa Coombs.  13. Major depressive: PTA--was on Effexor, Abilify, Remeron and Trazodone.              --lethargy could be due to daytime Abilify-->will change to nights             --Remeron and trazodone on hold per neuro  -Continue melatonin 14. GAD w/panic attacks/PTSD: Chronic issues onoging and followed by [facility].  --Was on Prazosin and Buspar.             --Mood stable on current dose Buspar. Monitor for now  15. Hypokalemia: Improved with supplementation.  -11/21 potassium lower today at 3.1, will change potassium supplement to 20 mEq twice daily.  Recheck tomorrow  -11/22 K+ up to 3.2, increase to in am and in evening  -11/22 potassium supplement increased to 40 mEq twice daily; repeat on Monday  -11/25 potassium up to 3.4 continue supplementation  11/21 up to 4.1 this AM, continue current regimen and monitor   12/1 K+ 3.6 today, continue Klor con M TID   12/3 potassium stable at 3.7, continue current regimen.  Will need outpatient follow-up to recheck potassium  12/4 recheck levels with PCP or cardiology outpatient after DC recommended 16. Leucocytosis: WBC trending down-->monitor  -11/25 within normal limits 17. OSA: Now compliant with CPAP but has been requiring 5 L oxygen per [place].              --question OHS as contributing factor leading to hypersomnia. Wean oxygen as able.   -Appears to be tolerating weaning down nasal O2, he reports he used his CPAP at night  07-01-23: Compliant with CPAP, stable on 2 L nasal cannula  12/2 remains compliant with CPAP  18. COPD: On Trilegy at home -->on Breo/Incruse  Continue to wean off oxygen.   19. Neuropathy likely related to diabetes mellitus type 2: Managed with spinal cord stimulator and Lyrica (Dr. Beverly Gust Shaw/[facility]).   -11-23: Patient complaining of worsening  symptoms, especially at night.  However, appears already on max dose Lyrica and venlafaxine.  Defer further medication adjustments to primary team.   11/30 pain controlled, reports robaxin helping   12/3 discussed with patient could consider Qutenza as an outpatient  20. Gout flare R great toe: On colchicine bid for 5 more days, resume allopurinol in 2 to 3 weeks  -11/25 colchicine was decreased to daily, discontinue after 1 more day  -12/4 Allopurinol and short term colchicine restarted 21. CKD 3A:BUN/Scr-38/1.31 at admission.   -Creatinine a little higher today at 1.41/BUN 32, patient is on diuretics per cardiology  -11/26 torsemide was held yesterday, creatinine down to 1.28  -11/27 hold torsemide this evening, Cr up to 1.44  11/28 creatinine improved to 1.2, BUN 24.  As torsemide 120 mg twice daily  12/1 Cr/BUN down to 1.17/16 today, recheck Tuesday  12/3 BUN/creatinine stable at 18/1.1  continue current dose torsemide  22. Morbid Obesity. Body mass index is 47.89 kg/m.             -Dietary counseling 23. Constipation   -12/1 miralax ordered, Senokot HS. PRN sorbitol 30ml  -LBM 12/2-improved  LOS: 14 days A FACE TO FACE EVALUATION WAS PERFORMED  Fanny Dance 07/12/2023, 9:55 AM

## 2023-07-12 NOTE — Progress Notes (Addendum)
Inpatient Rehabilitation Discharge Medication Review by a Pharmacist  A complete drug regimen review was completed for this patient to identify any potential clinically significant medication issues.  High Risk Drug Classes Is patient taking? Indication by Medication  Antipsychotic Yes  Aripiprazole for depression  Anticoagulant Yes Apixaban for afib  Antibiotic No   Opioid No   Antiplatelet No   Hypoglycemics/insulin Yes Insulin aspart and degludec for diabetes  Vasoactive Medication Yes Metoprolol for afib   Chemotherapy No   Other Yes Acetaminophen for mild pain Albuterol for shortness of breath Allopurinol and colchicine for gout Buspirone for anxiety Fenofibrate, vascepa, rosuvastatin for HLD Vitamin B12, D, Potasisum for supplement Fluticasone for allergies Lacosamide and levetiracetam for seizures Methocarbamol for muscle spasms Omeprazole for GERD Miralax and senna-doc for constipation Pregabalin for neuropathy Torsemide for HFpEF Trelegy for COPD Venlafaxine for depression      Type of Medication Issue Identified Description of Issue Recommendation(s)  Drug Interaction(s) (clinically significant)     Duplicate Therapy     Allergy     No Medication Administration End Date     Incorrect Dose     Additional Drug Therapy Needed     Significant med changes from prior encounter (inform family/care partners about these prior to discharge). NEW rosuvastatin, colchicine CHANGE allopurinol dose DECREASE trazodone INCREASE potassium  STOP dapagliflozin, metformin, spironolactone, prazosin, mirtazapine, repaglinide Team to educate  Other  Colchicine is to prevent gout flare as allopurinol is titrated back up per Gout Guidelines and should continue for at least 30 days. Prescription sent for 6 days.   Rosuvastatin is new and prescription not sent OK to increase prescription to 30 days per Derrill Memo to review     Clinically significant medication issues were  identified that warrant physician communication and completion of prescribed/recommended actions by midnight of the next day:  Yes  Name of provider notified for urgent issues identified: Marissa Nestle  Provider Method of Notification: Phone call     Pharmacist comments: Pam to review atorvastatin to rosuvastatin switch and determine if new prescription to be sent. Clinically significant medication issue was resolved per above. Rosuvastatin Rx sent.   Time spent performing this drug regimen review (minutes):  25  Alphia Moh, PharmD, [place], [facility] Clinical Pharmacist  Please check AMION for all [facility] phone numbers After 10:00 PM, call Main Pharmacy [phone]

## 2023-07-12 NOTE — Progress Notes (Signed)
Inpatient Rehabilitation Care Coordinator Discharge Note   Patient Details  Name: Lawrence [name] MRN: [id number] Date of Birth: Dec 26, 1955   Discharge location: HOME WITH SIGNIFICANT OTHER WHO CAN PROVIDE 24/7 SUPERVISION  Length of Stay: 14 DAYS  Discharge activity level: SUPERVISION-CGA LEVEL  Home/community participation: LIMITED  Patient response EX:BMWUXL Literacy - How often do you need to have someone help you when you read instructions, pamphlets, or other written material from your doctor or pharmacy?: Never  Patient response KG:MWNUUV Isolation - How often do you feel lonely or isolated from those around you?: Rarely  Services provided included: MD, RD, PT, OT, SLP, RN, CM, TR, Pharmacy, Neuropsych, SW  Financial Services:  Field seismologist Utilized: Private Insurance [employer], [employer] MEDICARE AND MEDICAID  Choices offered to/list presented to: PT  Follow-up services arranged:  Home Health, Patient/Family request agency HH/DME Home Health Agency: [facility]  PT  OT  RN      HH/DME Requested Agency: [facility]  Patient response to transportation need: Is the patient able to respond to transportation needs?: Yes In the past 12 months, has lack of transportation kept you from medical appointments or from getting medications?: No In the past 12 months, has lack of transportation kept you from meetings, work, or from getting things needed for daily living?: No   Patient/Family verbalized understanding of follow-up arrangements:  Yes  Individual responsible for coordination of the follow-up plan: SELF AND [phone]  Confirmed correct DME delivered: Lucy Chris 07/12/2023    Comments (or additional information):PT FEELS READY TO GO HOME TODAY AND IS DOING BETTER THAN PRIOR TO ADMISSION  Summary of Stay    Date/Time Discharge Planning CSW  07/05/23 0924 Home with significant other who is there but not able to provide much physical assist. Has  aide from [place] for four hours four days per week. Has all needed equipment from past admissions RGD       Rosalio Catterton, Lemar Livings

## 2023-07-12 NOTE — Progress Notes (Signed)
Discharge instructions provided by Rinaldo Cloud love, PA, TOC provided medications. Patient discharged via [facility].    Tilden Dome, LPN

## 2023-07-12 NOTE — Progress Notes (Signed)
Meds reviewed per pharm recommendations--Crestor listed at home med but was on Lipitor 40 mg PTA. Will order Crestor 40 mg (increased 11/25) and have patient follow up with cards. Had mild gout flare and has been on allopurinol for prophylaxis for years--not a new start.  This was discussed with Dr. Benjie Karvonen and recs reviewed. We will recommend titration to patient and follow up with PCP for further recommendations.

## 2023-07-13 ENCOUNTER — Encounter (HOSPITAL_COMMUNITY): Payer: Medicare HMO

## 2023-07-13 ENCOUNTER — Other Ambulatory Visit (HOSPITAL_COMMUNITY): Payer: Self-pay | Admitting: Cardiology

## 2023-07-13 DIAGNOSIS — I130 Hypertensive heart and chronic kidney disease with heart failure and stage 1 through stage 4 chronic kidney disease, or unspecified chronic kidney disease: Secondary | ICD-10-CM | POA: Diagnosis not present

## 2023-07-13 DIAGNOSIS — I5032 Chronic diastolic (congestive) heart failure: Secondary | ICD-10-CM | POA: Diagnosis not present

## 2023-07-13 DIAGNOSIS — I480 Paroxysmal atrial fibrillation: Secondary | ICD-10-CM | POA: Diagnosis not present

## 2023-07-13 DIAGNOSIS — D631 Anemia in chronic kidney disease: Secondary | ICD-10-CM | POA: Diagnosis not present

## 2023-07-13 DIAGNOSIS — G40909 Epilepsy, unspecified, not intractable, without status epilepticus: Secondary | ICD-10-CM | POA: Diagnosis not present

## 2023-07-13 DIAGNOSIS — I69354 Hemiplegia and hemiparesis following cerebral infarction affecting left non-dominant side: Secondary | ICD-10-CM | POA: Diagnosis not present

## 2023-07-13 DIAGNOSIS — E1142 Type 2 diabetes mellitus with diabetic polyneuropathy: Secondary | ICD-10-CM | POA: Diagnosis not present

## 2023-07-13 DIAGNOSIS — E1122 Type 2 diabetes mellitus with diabetic chronic kidney disease: Secondary | ICD-10-CM | POA: Diagnosis not present

## 2023-07-13 DIAGNOSIS — N1831 Chronic kidney disease, stage 3a: Secondary | ICD-10-CM | POA: Diagnosis not present

## 2023-07-14 ENCOUNTER — Emergency Department (HOSPITAL_COMMUNITY): Payer: No Typology Code available for payment source

## 2023-07-14 ENCOUNTER — Encounter (HOSPITAL_COMMUNITY): Payer: Self-pay

## 2023-07-14 ENCOUNTER — Other Ambulatory Visit: Payer: Self-pay

## 2023-07-14 ENCOUNTER — Observation Stay (HOSPITAL_COMMUNITY): Payer: No Typology Code available for payment source

## 2023-07-14 ENCOUNTER — Inpatient Hospital Stay (HOSPITAL_COMMUNITY)
Admission: EM | Admit: 2023-07-14 | Discharge: 2023-07-17 | DRG: 312 | Disposition: A | Discharge status: Home Health | Payer: No Typology Code available for payment source | Attending: Internal Medicine | Admitting: Internal Medicine

## 2023-07-14 DIAGNOSIS — E860 Dehydration: Secondary | ICD-10-CM | POA: Diagnosis present

## 2023-07-14 DIAGNOSIS — G9341 Metabolic encephalopathy: Secondary | ICD-10-CM | POA: Diagnosis not present

## 2023-07-14 DIAGNOSIS — Z7901 Long term (current) use of anticoagulants: Secondary | ICD-10-CM

## 2023-07-14 DIAGNOSIS — E1169 Type 2 diabetes mellitus with other specified complication: Secondary | ICD-10-CM | POA: Diagnosis present

## 2023-07-14 DIAGNOSIS — G40909 Epilepsy, unspecified, not intractable, without status epilepticus: Secondary | ICD-10-CM

## 2023-07-14 DIAGNOSIS — J4489 Other specified chronic obstructive pulmonary disease: Secondary | ICD-10-CM | POA: Diagnosis present

## 2023-07-14 DIAGNOSIS — F419 Anxiety disorder, unspecified: Secondary | ICD-10-CM | POA: Diagnosis present

## 2023-07-14 DIAGNOSIS — Z860101 Personal history of adenomatous and serrated colon polyps: Secondary | ICD-10-CM

## 2023-07-14 DIAGNOSIS — I951 Orthostatic hypotension: Principal | ICD-10-CM | POA: Diagnosis present

## 2023-07-14 DIAGNOSIS — J9601 Acute respiratory failure with hypoxia: Secondary | ICD-10-CM | POA: Diagnosis not present

## 2023-07-14 DIAGNOSIS — R569 Unspecified convulsions: Secondary | ICD-10-CM

## 2023-07-14 DIAGNOSIS — I69354 Hemiplegia and hemiparesis following cerebral infarction affecting left non-dominant side: Secondary | ICD-10-CM

## 2023-07-14 DIAGNOSIS — E66813 Obesity, class 3: Secondary | ICD-10-CM | POA: Diagnosis present

## 2023-07-14 DIAGNOSIS — R531 Weakness: Secondary | ICD-10-CM | POA: Diagnosis not present

## 2023-07-14 DIAGNOSIS — E1122 Type 2 diabetes mellitus with diabetic chronic kidney disease: Secondary | ICD-10-CM | POA: Diagnosis present

## 2023-07-14 DIAGNOSIS — N1831 Chronic kidney disease, stage 3a: Secondary | ICD-10-CM | POA: Diagnosis present

## 2023-07-14 DIAGNOSIS — T50915A Adverse effect of multiple unspecified drugs, medicaments and biological substances, initial encounter: Secondary | ICD-10-CM | POA: Diagnosis present

## 2023-07-14 DIAGNOSIS — I83019 Varicose veins of right lower extremity with ulcer of unspecified site: Secondary | ICD-10-CM | POA: Diagnosis present

## 2023-07-14 DIAGNOSIS — I5032 Chronic diastolic (congestive) heart failure: Secondary | ICD-10-CM | POA: Diagnosis present

## 2023-07-14 DIAGNOSIS — Z87891 Personal history of nicotine dependence: Secondary | ICD-10-CM

## 2023-07-14 DIAGNOSIS — Z7985 Long-term (current) use of injectable non-insulin antidiabetic drugs: Secondary | ICD-10-CM

## 2023-07-14 DIAGNOSIS — J449 Chronic obstructive pulmonary disease, unspecified: Secondary | ICD-10-CM | POA: Diagnosis present

## 2023-07-14 DIAGNOSIS — E1140 Type 2 diabetes mellitus with diabetic neuropathy, unspecified: Secondary | ICD-10-CM | POA: Diagnosis present

## 2023-07-14 DIAGNOSIS — Z8249 Family history of ischemic heart disease and other diseases of the circulatory system: Secondary | ICD-10-CM

## 2023-07-14 DIAGNOSIS — Y92009 Unspecified place in unspecified non-institutional (private) residence as the place of occurrence of the external cause: Secondary | ICD-10-CM

## 2023-07-14 DIAGNOSIS — I83029 Varicose veins of left lower extremity with ulcer of unspecified site: Secondary | ICD-10-CM | POA: Diagnosis present

## 2023-07-14 DIAGNOSIS — E7800 Pure hypercholesterolemia, unspecified: Secondary | ICD-10-CM | POA: Diagnosis present

## 2023-07-14 DIAGNOSIS — N179 Acute kidney failure, unspecified: Secondary | ICD-10-CM | POA: Diagnosis not present

## 2023-07-14 DIAGNOSIS — I499 Cardiac arrhythmia, unspecified: Secondary | ICD-10-CM | POA: Diagnosis not present

## 2023-07-14 DIAGNOSIS — Z96652 Presence of left artificial knee joint: Secondary | ICD-10-CM | POA: Diagnosis present

## 2023-07-14 DIAGNOSIS — R4182 Altered mental status, unspecified: Principal | ICD-10-CM

## 2023-07-14 DIAGNOSIS — N400 Enlarged prostate without lower urinary tract symptoms: Secondary | ICD-10-CM | POA: Diagnosis present

## 2023-07-14 DIAGNOSIS — Z6841 Body Mass Index (BMI) 40.0 and over, adult: Secondary | ICD-10-CM

## 2023-07-14 DIAGNOSIS — I480 Paroxysmal atrial fibrillation: Secondary | ICD-10-CM | POA: Diagnosis present

## 2023-07-14 DIAGNOSIS — W19XXXA Unspecified fall, initial encounter: Secondary | ICD-10-CM | POA: Diagnosis present

## 2023-07-14 DIAGNOSIS — J9602 Acute respiratory failure with hypercapnia: Secondary | ICD-10-CM | POA: Diagnosis present

## 2023-07-14 DIAGNOSIS — G4733 Obstructive sleep apnea (adult) (pediatric): Secondary | ICD-10-CM | POA: Diagnosis present

## 2023-07-14 DIAGNOSIS — Z794 Long term (current) use of insulin: Secondary | ICD-10-CM

## 2023-07-14 DIAGNOSIS — Z888 Allergy status to other drugs, medicaments and biological substances status: Secondary | ICD-10-CM

## 2023-07-14 DIAGNOSIS — Z91199 Patient's noncompliance with other medical treatment and regimen due to unspecified reason: Secondary | ICD-10-CM

## 2023-07-14 DIAGNOSIS — Z79899 Other long term (current) drug therapy: Secondary | ICD-10-CM

## 2023-07-14 DIAGNOSIS — Z66 Do not resuscitate: Secondary | ICD-10-CM | POA: Diagnosis present

## 2023-07-14 DIAGNOSIS — I6930 Unspecified sequelae of cerebral infarction: Secondary | ICD-10-CM

## 2023-07-14 DIAGNOSIS — Z953 Presence of xenogenic heart valve: Secondary | ICD-10-CM

## 2023-07-14 DIAGNOSIS — Z881 Allergy status to other antibiotic agents status: Secondary | ICD-10-CM

## 2023-07-14 DIAGNOSIS — I639 Cerebral infarction, unspecified: Secondary | ICD-10-CM

## 2023-07-14 DIAGNOSIS — R400 Somnolence: Secondary | ICD-10-CM | POA: Diagnosis not present

## 2023-07-14 DIAGNOSIS — I10 Essential (primary) hypertension: Secondary | ICD-10-CM | POA: Diagnosis present

## 2023-07-14 DIAGNOSIS — I2510 Atherosclerotic heart disease of native coronary artery without angina pectoris: Secondary | ICD-10-CM | POA: Diagnosis present

## 2023-07-14 DIAGNOSIS — Z833 Family history of diabetes mellitus: Secondary | ICD-10-CM

## 2023-07-14 DIAGNOSIS — E669 Obesity, unspecified: Secondary | ICD-10-CM

## 2023-07-14 DIAGNOSIS — I130 Hypertensive heart and chronic kidney disease with heart failure and stage 1 through stage 4 chronic kidney disease, or unspecified chronic kidney disease: Secondary | ICD-10-CM | POA: Diagnosis present

## 2023-07-14 LAB — BLOOD GAS, VENOUS
Acid-Base Excess: 4.4 mmol/L — ABNORMAL HIGH (ref 0.0–2.0)
Bicarbonate: 33.2 mmol/L — ABNORMAL HIGH (ref 20.0–28.0)
Drawn by: 5979
O2 Saturation: 37.4 %
Patient temperature: 37
pCO2, Ven: 69 mm[Hg] — ABNORMAL HIGH (ref 44–60)
pH, Ven: 7.29 (ref 7.25–7.43)
pO2, Ven: <31 mm[Hg] — CL (ref 32–45)

## 2023-07-14 LAB — URINALYSIS, ROUTINE W REFLEX MICROSCOPIC
Bilirubin Urine: NEGATIVE
Glucose, UA: NEGATIVE mg/dL
Hgb urine dipstick: NEGATIVE
Ketones, ur: NEGATIVE mg/dL
Leukocytes,Ua: NEGATIVE
Nitrite: NEGATIVE
Protein, ur: NEGATIVE mg/dL
Specific Gravity, Urine: 1.019 (ref 1.005–1.030)
pH: 5 (ref 5.0–8.0)

## 2023-07-14 LAB — DIFFERENTIAL
Abs Immature Granulocytes: 0.04 10*3/uL (ref 0.00–0.07)
Basophils Absolute: 0.1 10*3/uL (ref 0.0–0.1)
Basophils Relative: 1 %
Eosinophils Absolute: 0.5 10*3/uL (ref 0.0–0.5)
Eosinophils Relative: 5 %
Immature Granulocytes: 0 %
Lymphocytes Relative: 22 %
Lymphs Abs: 2.3 10*3/uL (ref 0.7–4.0)
Monocytes Absolute: 0.9 10*3/uL (ref 0.1–1.0)
Monocytes Relative: 8 %
Neutro Abs: 6.7 10*3/uL (ref 1.7–7.7)
Neutrophils Relative %: 64 %

## 2023-07-14 LAB — I-STAT ARTERIAL BLOOD GAS, ED
Acid-Base Excess: 2 mmol/L (ref 0.0–2.0)
Bicarbonate: 28.8 mmol/L — ABNORMAL HIGH (ref 20.0–28.0)
Calcium, Ion: 1.31 mmol/L (ref 1.15–1.40)
HCT: 44 % (ref 39.0–52.0)
Hemoglobin: 15 g/dL (ref 13.0–17.0)
O2 Saturation: 91 %
Patient temperature: 98.1
Potassium: 3.5 mmol/L (ref 3.5–5.1)
Sodium: 142 mmol/L (ref 135–145)
TCO2: 30 mmol/L (ref 22–32)
pCO2 arterial: 52.2 mm[Hg] — ABNORMAL HIGH (ref 32–48)
pH, Arterial: 7.348 — ABNORMAL LOW (ref 7.35–7.45)
pO2, Arterial: 64 mm[Hg] — ABNORMAL LOW (ref 83–108)

## 2023-07-14 LAB — COMPREHENSIVE METABOLIC PANEL
ALT: 24 U/L (ref 0–44)
AST: 21 U/L (ref 15–41)
Albumin: 4 g/dL (ref 3.5–5.0)
Alkaline Phosphatase: 64 U/L (ref 38–126)
Anion gap: 14 (ref 5–15)
BUN: 29 mg/dL — ABNORMAL HIGH (ref 8–23)
CO2: 24 mmol/L (ref 22–32)
Calcium: 9.6 mg/dL (ref 8.9–10.3)
Chloride: 102 mmol/L (ref 98–111)
Creatinine, Ser: 1.86 mg/dL — ABNORMAL HIGH (ref 0.61–1.24)
GFR, Estimated: 39 mL/min — ABNORMAL LOW (ref >60)
Glucose, Bld: 122 mg/dL — ABNORMAL HIGH (ref 70–99)
Potassium: 4 mmol/L (ref 3.5–5.1)
Sodium: 140 mmol/L (ref 135–145)
Total Bilirubin: 0.6 mg/dL (ref <1.2)
Total Protein: 7.3 g/dL (ref 6.5–8.1)

## 2023-07-14 LAB — CBG MONITORING, ED
Glucose-Capillary: 144 mg/dL — ABNORMAL HIGH (ref 70–99)
Glucose-Capillary: 111 mg/dL — ABNORMAL HIGH (ref 70–99)

## 2023-07-14 LAB — I-STAT CHEM 8, ED
BUN: 35 mg/dL — ABNORMAL HIGH (ref 8–23)
Calcium, Ion: 1.06 mmol/L — ABNORMAL LOW (ref 1.15–1.40)
Chloride: 105 mmol/L (ref 98–111)
Creatinine, Ser: 2 mg/dL — ABNORMAL HIGH (ref 0.61–1.24)
Glucose, Bld: 119 mg/dL — ABNORMAL HIGH (ref 70–99)
HCT: 47 % (ref 39.0–52.0)
Hemoglobin: 16 g/dL (ref 13.0–17.0)
Potassium: 4.1 mmol/L (ref 3.5–5.1)
Sodium: 142 mmol/L (ref 135–145)
TCO2: 27 mmol/L (ref 22–32)

## 2023-07-14 LAB — PROTIME-INR
INR: 1.1 (ref 0.8–1.2)
Prothrombin Time: 14.3 s (ref 11.4–15.2)

## 2023-07-14 LAB — RAPID URINE DRUG SCREEN, HOSP PERFORMED
Amphetamines: NOT DETECTED
Barbiturates: NOT DETECTED
Benzodiazepines: NOT DETECTED
Cocaine: NOT DETECTED
Opiates: NOT DETECTED
Tetrahydrocannabinol: NOT DETECTED

## 2023-07-14 LAB — CBC
HCT: 46.6 % (ref 39.0–52.0)
Hemoglobin: 14.7 g/dL (ref 13.0–17.0)
MCH: 28.7 pg (ref 26.0–34.0)
MCHC: 31.5 g/dL (ref 30.0–36.0)
MCV: 90.8 fL (ref 80.0–100.0)
Platelets: 240 10*3/uL (ref 150–400)
RBC: 5.13 MIL/uL (ref 4.22–5.81)
RDW: 14.9 % (ref 11.5–15.5)
WBC: 10.5 10*3/uL (ref 4.0–10.5)
nRBC: 0 % (ref 0.0–0.2)

## 2023-07-14 LAB — TSH: TSH: 1.916 u[IU]/mL (ref 0.350–4.500)

## 2023-07-14 LAB — APTT: aPTT: 29 s (ref 24–36)

## 2023-07-14 LAB — ETHANOL: Alcohol, Ethyl (B): <10 mg/dL (ref <10)

## 2023-07-14 LAB — AMMONIA: Ammonia: 31 umol/L (ref 9–35)

## 2023-07-14 LAB — GLUCOSE, CAPILLARY
Glucose-Capillary: 127 mg/dL — ABNORMAL HIGH (ref 70–99)
Glucose-Capillary: 108 mg/dL — ABNORMAL HIGH (ref 70–99)

## 2023-07-14 LAB — TROPONIN I (HIGH SENSITIVITY): Troponin I (High Sensitivity): 5 ng/L (ref <18)

## 2023-07-14 LAB — BRAIN NATRIURETIC PEPTIDE: B Natriuretic Peptide: 27.5 pg/mL (ref 0.0–100.0)

## 2023-07-14 MED ORDER — TRAZODONE HCL 50 MG PO TABS
50.0000 mg | ORAL_TABLET | Freq: Every day | ORAL | Status: DC
  Administered 2023-07-14 – 2023-07-17 (×4): 50 mg via ORAL
  Filled 2023-07-14 (×4): qty 1

## 2023-07-14 MED ORDER — APIXABAN 5 MG PO TABS: 5.0000 mg | ORAL_TABLET | Freq: Two times a day (BID) | ORAL | Status: DC

## 2023-07-14 MED ORDER — SODIUM CHLORIDE 0.9 % IV SOLN
INTRAVENOUS | Status: DC
Start: 2023-07-14 — End: 2023-07-15

## 2023-07-14 MED ORDER — LEVETIRACETAM 500 MG PO TABS: 500.0000 mg | ORAL_TABLET | Freq: Two times a day (BID) | ORAL | Status: DC

## 2023-07-14 MED ORDER — LACOSAMIDE 50 MG PO TABS
150.0000 mg | ORAL_TABLET | Freq: Two times a day (BID) | ORAL | Status: DC
  Administered 2023-07-14 – 2023-07-17 (×7): 150 mg via ORAL
  Filled 2023-07-14 (×7): qty 3

## 2023-07-14 MED ORDER — ALBUTEROL SULFATE (2.5 MG/3ML) 0.083% IN NEBU: 2.5000 mg | INHALATION_SOLUTION | RESPIRATORY_TRACT | Status: DC | PRN

## 2023-07-14 MED ORDER — FLUTICASONE FUROATE-VILANTEROL 200-25 MCG/ACT IN AEPB
1.0000 | INHALATION_SPRAY | Freq: Every day | RESPIRATORY_TRACT | Status: DC
  Administered 2023-07-15 – 2023-07-17 (×3): 1 via RESPIRATORY_TRACT
  Filled 2023-07-14: qty 28

## 2023-07-14 MED ORDER — SODIUM CHLORIDE 0.9 % IV SOLN: INTRAVENOUS | Status: DC

## 2023-07-14 MED ORDER — VENLAFAXINE HCL ER 75 MG PO CP24
225.0000 mg | ORAL_CAPSULE | Freq: Every day | ORAL | Status: DC
  Administered 2023-07-15 – 2023-07-17 (×3): 225 mg via ORAL
  Filled 2023-07-14 (×3): qty 3

## 2023-07-14 MED ORDER — PANTOPRAZOLE SODIUM 40 MG PO TBEC
40.0000 mg | DELAYED_RELEASE_TABLET | Freq: Every day | ORAL | Status: DC
  Administered 2023-07-15 – 2023-07-17 (×3): 40 mg via ORAL
  Filled 2023-07-14 (×4): qty 1

## 2023-07-14 MED ORDER — SENNOSIDES-DOCUSATE SODIUM 8.6-50 MG PO TABS
1.0000 | ORAL_TABLET | Freq: Every day | ORAL | Status: DC
Start: 2023-07-15 — End: 2023-07-18
  Administered 2023-07-14 – 2023-07-16 (×3): 1 via ORAL
  Filled 2023-07-14 (×3): qty 1

## 2023-07-14 MED ORDER — ALLOPURINOL 100 MG PO TABS
100.0000 mg | ORAL_TABLET | Freq: Every day | ORAL | Status: DC
  Administered 2023-07-15 – 2023-07-17 (×3): 100 mg via ORAL
  Filled 2023-07-14 (×3): qty 1

## 2023-07-14 MED ORDER — SODIUM CHLORIDE 0.9% FLUSH
3.0000 mL | Freq: Two times a day (BID) | INTRAVENOUS | Status: DC
  Administered 2023-07-15 – 2023-07-17 (×5): 3 mL via INTRAVENOUS

## 2023-07-14 MED ORDER — LORAZEPAM 2 MG/ML IJ SOLN
0.5000 mg | Freq: Once | INTRAMUSCULAR | Status: AC | PRN
  Administered 2023-07-14: 0.5 mg via INTRAVENOUS
  Filled 2023-07-14: qty 1

## 2023-07-14 MED ORDER — POLYETHYLENE GLYCOL 3350 17 G PO PACK
17.0000 g | PACK | Freq: Every day | ORAL | Status: DC
  Filled 2023-07-14 (×3): qty 1

## 2023-07-14 MED ORDER — INSULIN ASPART 100 UNIT/ML IJ SOLN
0.0000 [IU] | Freq: Three times a day (TID) | INTRAMUSCULAR | Status: DC
  Administered 2023-07-14: 1 [IU] via SUBCUTANEOUS
  Administered 2023-07-15: 2 [IU] via SUBCUTANEOUS
  Administered 2023-07-15: 3 [IU] via SUBCUTANEOUS
  Administered 2023-07-15 – 2023-07-16 (×3): 1 [IU] via SUBCUTANEOUS
  Administered 2023-07-17 (×2): 2 [IU] via SUBCUTANEOUS
  Administered 2023-07-17: 3 [IU] via SUBCUTANEOUS

## 2023-07-14 MED ORDER — UMECLIDINIUM BROMIDE 62.5 MCG/ACT IN AEPB
1.0000 | INHALATION_SPRAY | Freq: Every day | RESPIRATORY_TRACT | Status: DC
  Administered 2023-07-15 – 2023-07-17 (×3): 1 via RESPIRATORY_TRACT
  Filled 2023-07-14: qty 7

## 2023-07-14 MED ORDER — BUSPIRONE HCL 10 MG PO TABS
10.0000 mg | ORAL_TABLET | Freq: Three times a day (TID) | ORAL | Status: DC
  Administered 2023-07-14 – 2023-07-17 (×10): 10 mg via ORAL
  Filled 2023-07-14 (×10): qty 1

## 2023-07-14 MED ORDER — IPRATROPIUM-ALBUTEROL 0.5-2.5 (3) MG/3ML IN SOLN: 3.0000 mL | RESPIRATORY_TRACT | Status: DC | PRN

## 2023-07-14 MED ORDER — ACETAMINOPHEN 325 MG PO TABS
650.0000 mg | ORAL_TABLET | Freq: Four times a day (QID) | ORAL | Status: DC | PRN
  Administered 2023-07-15 – 2023-07-17 (×3): 650 mg via ORAL
  Filled 2023-07-14 (×3): qty 2

## 2023-07-14 MED ORDER — ROSUVASTATIN CALCIUM 20 MG PO TABS
40.0000 mg | ORAL_TABLET | Freq: Every day | ORAL | Status: DC
  Administered 2023-07-15 – 2023-07-17 (×3): 40 mg via ORAL
  Filled 2023-07-14 (×3): qty 2

## 2023-07-14 MED ORDER — METHOCARBAMOL 500 MG PO TABS
500.0000 mg | ORAL_TABLET | Freq: Three times a day (TID) | ORAL | Status: DC | PRN
  Administered 2023-07-14 – 2023-07-17 (×6): 500 mg via ORAL
  Filled 2023-07-14 (×6): qty 1

## 2023-07-14 MED ORDER — LEVETIRACETAM 500 MG PO TABS
500.0000 mg | ORAL_TABLET | Freq: Two times a day (BID) | ORAL | Status: DC
  Administered 2023-07-14 – 2023-07-17 (×8): 500 mg via ORAL
  Filled 2023-07-14 (×8): qty 1

## 2023-07-14 MED ORDER — METOPROLOL SUCCINATE ER 25 MG PO TB24
25.0000 mg | ORAL_TABLET | Freq: Every day | ORAL | Status: DC
  Administered 2023-07-15 – 2023-07-17 (×3): 25 mg via ORAL
  Filled 2023-07-14 (×3): qty 1

## 2023-07-14 MED ORDER — ARIPIPRAZOLE 5 MG PO TABS
5.0000 mg | ORAL_TABLET | Freq: Every evening | ORAL | Status: DC
  Administered 2023-07-14 – 2023-07-17 (×4): 5 mg via ORAL
  Filled 2023-07-14 (×4): qty 1

## 2023-07-14 MED ORDER — VITAMIN B-12 1000 MCG PO TABS
1000.0000 ug | ORAL_TABLET | Freq: Every day | ORAL | Status: DC
Start: 2023-07-15 — End: 2023-07-18
  Administered 2023-07-15 – 2023-07-17 (×3): 1000 ug via ORAL
  Filled 2023-07-14 (×3): qty 1

## 2023-07-14 MED ORDER — FLUTICASONE PROPIONATE 50 MCG/ACT NA SUSP
2.0000 | Freq: Every day | NASAL | Status: DC
  Administered 2023-07-15 – 2023-07-17 (×3): 2 via NASAL
  Filled 2023-07-14: qty 16

## 2023-07-14 MED ORDER — ICOSAPENT ETHYL 1 G PO CAPS
2.0000 g | ORAL_CAPSULE | Freq: Two times a day (BID) | ORAL | Status: DC
  Administered 2023-07-15 – 2023-07-17 (×6): 2 g via ORAL
  Filled 2023-07-14 (×6): qty 2

## 2023-07-14 MED ORDER — IOHEXOL 350 MG/ML SOLN
75.0000 mL | Freq: Once | INTRAVENOUS | Status: AC | PRN
  Administered 2023-07-14: 75 mL via INTRAVENOUS

## 2023-07-14 MED ORDER — INSULIN GLARGINE-YFGN 100 UNIT/ML ~~LOC~~ SOLN
30.0000 [IU] | Freq: Every day | SUBCUTANEOUS | Status: DC
Start: 2023-07-15 — End: 2023-07-18
  Administered 2023-07-15 – 2023-07-17 (×3): 30 [IU] via SUBCUTANEOUS
  Filled 2023-07-14 (×4): qty 0.3

## 2023-07-14 MED ORDER — FENOFIBRATE 160 MG PO TABS
160.0000 mg | ORAL_TABLET | Freq: Every day | ORAL | Status: DC
  Administered 2023-07-15 – 2023-07-17 (×3): 160 mg via ORAL
  Filled 2023-07-14 (×3): qty 1

## 2023-07-14 MED ORDER — ACETAMINOPHEN 650 MG RE SUPP: 650.0000 mg | Freq: Four times a day (QID) | RECTAL | Status: DC | PRN

## 2023-07-14 MED ORDER — PREGABALIN 75 MG PO CAPS
150.0000 mg | ORAL_CAPSULE | Freq: Three times a day (TID) | ORAL | Status: DC
  Administered 2023-07-14 – 2023-07-17 (×10): 150 mg via ORAL
  Filled 2023-07-14 (×10): qty 2

## 2023-07-14 MED ORDER — APIXABAN 5 MG PO TABS
5.0000 mg | ORAL_TABLET | Freq: Two times a day (BID) | ORAL | Status: DC
  Administered 2023-07-14 – 2023-07-17 (×8): 5 mg via ORAL
  Filled 2023-07-14 (×6): qty 1
  Filled 2023-07-14: qty 2
  Filled 2023-07-14: qty 1

## 2023-07-14 NOTE — Progress Notes (Signed)
RT was walking by pt room and pt took off BIPAP. RT placed pt on [place]. Pt is tolerating well and vitals stable. Pt is aware of where he is and who he is. RT will monitor.

## 2023-07-14 NOTE — Progress Notes (Signed)
Patient has a Scientist, research (life sciences), Per [employer] A.Tillery. Patient can't have MRI because her leads are not safe for MRI.

## 2023-07-14 NOTE — Progress Notes (Addendum)
  Patient passed bedside swallow screen.  Ordered heart healthy diet. Patient and patient's wife is very upset that MRI has been not done yet and he has been waiting for that since 1:30 PM.  Patient's wife reported that patient has a spinal stimulator which can be switched to MRI mode so MRI can be obtained.  Patient at the bedside had the remote and he placed the spinal stimulator to MRI mode and we have coordinated with the radiology department tonight and patient is going for MRI at 9:40 p.m.   Addendum, - MRI no acute intracranial abnormality.  No evidence of a stroke.

## 2023-07-14 NOTE — ED Notes (Signed)
ED TO INPATIENT HANDOFF REPORT  ED Nurse Name and Phone #: Forde Dandy [phone]  S Name/Age/Gender Lawrence [name] 67 y.o. male Room/Bed: 015C/015C  Code Status   Code Status: Limited: Do not attempt resuscitation (DNR) -DNR-LIMITED -Do Not Intubate/DNI   Home/SNF/Other Home Patient oriented to: self, place, time, and situation Is this baseline? Yes   Triage Complete: Triage complete  Chief Complaint Left-sided weakness [R53.1] Acute metabolic encephalopathy [G93.41]  Triage Note Arrives [facility] as a code stroke. Notification made to 911 after patient has slid from his lift assist recliner where he sleeps.   Reported to have substantial left sided weakness compared to chronic left sided weakness.   Recent small vessel CVA. Compliant on Eliquis for afib.   Last known normal - 2230 07/13/23   Allergies Allergies  Allergen Reactions   Vancomycin Other (See Comments)    "Red Man Syndrome" 02/02/17: possible cause for rash under both arms   Niacin And Related Other (See Comments)    Red man syndrome   Tubersol [Tuberculin, Ppd] Other (See Comments)    Reaction unknown   Doxycycline Rash and Other (See Comments)    Level of Care/Admitting Diagnosis ED Disposition     ED Disposition  Admit   Condition  --   Comment  Hospital Area: [facility] [[id number]]  Level of Care: Progressive [102]  Admit to Progressive based on following criteria: NEUROLOGICAL AND NEUROSURGICAL complex patients with significant risk of instability, who do not meet ICU criteria, yet require close observation or frequent assessment (< / = every 2 - 4 hours) with medical / nursing intervention.  May place patient in observation at [facility] or [facility] if equivalent level of care is available:: No  Covid Evaluation: Asymptomatic - no recent exposure (last 10 days) testing not required  Diagnosis: Acute metabolic encephalopathy [[id number]]  Admitting Physician: Clydie Braun  [[id number]]  Attending Physician: Clydie Braun [[id number]]          B Medical/Surgery History Past Medical History:  Diagnosis Date   Acquired dilation of ascending aorta and aortic root (HCC)    40mm by echo 01/2021   Adenomatous colon polyp 2007   Anemia    Anxiety    Asthma    BPH without obstruction/lower urinary tract symptoms 02/22/2017   Chronic diastolic (congestive) heart failure (HCC)    Chronic venous stasis 03/07/2019   COPD (chronic obstructive pulmonary disease) (HCC)    Coronary artery calcification seen on CAT scan    Depression    Diabetic neuropathy (HCC) 09/11/2019   History of colon polyps 08/24/2018   Hypertension    Morbid obesity (HCC)    OSA (obstructive sleep apnea)    Pain due to onychomycosis of toenails of both feet 09/11/2019   Peripheral neuropathy 02/22/2017   Primary osteoarthritis, left shoulder 03/05/2017   PTSD (post-traumatic stress disorder)    Pure hypercholesterolemia    QT prolongation 03/07/2019   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 Lynnette Caffey and Dr. Laneta Simmers   Seizures [facility])    Severe aortic stenosis    Sinus tachycardia 03/07/2019   Sleep apnea    CPAP   Type 2 diabetes mellitus with vascular disease (HCC) 09/11/2019   Past Surgical History:  Procedure Laterality Date   ENDOVENOUS ABLATION SAPHENOUS VEIN W/ LASER Right 08/20/2020   endovenous laser ablation right greater saphenous vein by Cari Caraway MD    ENDOVENOUS ABLATION  SAPHENOUS VEIN W/ LASER Left 11/16/2022   endovenous laser ablation left greater saphenous vein by Cari Caraway MD   INTRAOPERATIVE TRANSTHORACIC ECHOCARDIOGRAM N/A 02/07/2023   Procedure: INTRAOPERATIVE TRANSTHORACIC ECHOCARDIOGRAM;  Surgeon: Orbie Pyo, MD;  Location: [facility] INVASIVE CV LAB;  Service: Open Heart Surgery;  Laterality: N/A;   JOINT REPLACEMENT     left knee replacement x 2   KNEE ARTHROSCOPY Bilateral    LEFT HEART  CATH AND CORONARY ANGIOGRAPHY N/A 01/17/2018   Procedure: LEFT HEART CATH AND CORONARY ANGIOGRAPHY;  Surgeon: Marykay Lex, MD;  Location: [facility] INVASIVE CV LAB;  Service: Cardiovascular;  Laterality: N/A;   PRESSURE SENSOR/CARDIOMEMS N/A 08/26/2021   Procedure: PRESSURE SENSOR/CARDIOMEMS;  Surgeon: Laurey Morale, MD;  Location: [facility] INVASIVE CV LAB;  Service: Cardiovascular;  Laterality: N/A;   RIGHT HEART CATH N/A 08/26/2021   Procedure: RIGHT HEART CATH;  Surgeon: Laurey Morale, MD;  Location: [facility] INVASIVE CV LAB;  Service: Cardiovascular;  Laterality: N/A;   RIGHT HEART CATH AND CORONARY ANGIOGRAPHY N/A 01/26/2023   Procedure: RIGHT HEART CATH AND CORONARY ANGIOGRAPHY;  Surgeon: Orbie Pyo, MD;  Location: [facility] INVASIVE CV LAB;  Service: Cardiovascular;  Laterality: N/A;   TEE WITHOUT CARDIOVERSION N/A 08/26/2021   Procedure: TRANSESOPHAGEAL ECHOCARDIOGRAM (TEE);  Surgeon: Laurey Morale, MD;  Location: [facility] ENDOSCOPY;  Service: Cardiovascular;  Laterality: N/A;   TOOTH EXTRACTION N/A 02/03/2023   Procedure: DENTAL RESTORATION/EXTRACTIONS;  Surgeon: [name], DMD;  Location: [facility] OR;  Service: Oral Surgery;  Laterality: N/A;   TRANSCATHETER AORTIC VALVE REPLACEMENT, TRANSFEMORAL Right 02/07/2023   Procedure: Transcatheter Aortic Valve Replacement, Transfemoral;  Surgeon: Orbie Pyo, MD;  Location: [facility] INVASIVE CV LAB;  Service: Open Heart Surgery;  Laterality: Right;   UMBILICAL HERNIA REPAIR       A IV Location/Drains/Wounds Patient Lines/Drains/Airways Status     Active Line/Drains/Airways     Name Placement date Placement time Site Days   Peripheral IV 07/14/23 20 G 1" Left Antecubital 07/14/23  0355  Antecubital  less than 1   Wound / Incision (Open or Dehisced) 10/26/22 Venous stasis ulcer Leg Left 10/26/22  2314  Leg  261            Intake/Output Last 24 hours  Intake/Output Summary (Last 24 hours) at 07/14/2023 1543 Last data filed at 07/14/2023 4696 Gross per  24 hour  Intake --  Output 1300 ml  Net -1300 ml    Labs/Imaging Results for orders placed or performed during the hospital encounter of 07/14/23 (from the past 48 hour(s))  Ethanol     Status: None   Collection Time: 07/14/23  3:14 AM  Result Value Ref Range   Alcohol, Ethyl (B) <10 <10 mg/dL    Comment: (NOTE) Lowest detectable limit for serum alcohol is 10 mg/dL.  For medical purposes only. Performed at [facility] Lab, [address], [place], [place] [address]   Protime-INR     Status: None   Collection Time: 07/14/23  3:14 AM  Result Value Ref Range   Prothrombin Time 14.3 11.4 - 15.2 seconds   INR 1.1 0.8 - 1.2    Comment: (NOTE) INR goal varies based on device and disease states. Performed at [facility] Lab, [address], [place], [place] [address]   APTT     Status: None   Collection Time: 07/14/23  3:14 AM  Result Value Ref Range   aPTT 29 24 - 36 seconds    Comment:  Performed at [facility], [address] [place], [place] [address]  CBC     Status: None   Collection Time: 07/14/23  3:14 AM  Result Value Ref Range   WBC 10.5 4.0 - 10.5 K/uL   RBC 5.13 4.22 - 5.81 MIL/uL   Hemoglobin 14.7 13.0 - 17.0 g/dL   HCT 40.9 81.1 - 91.4 %   MCV 90.8 80.0 - 100.0 fL   MCH 28.7 26.0 - 34.0 pg   MCHC 31.5 30.0 - 36.0 g/dL   RDW 78.2 95.6 - 21.3 %   Platelets 240 150 - 400 K/uL   nRBC 0.0 0.0 - 0.2 %    Comment: Performed at [facility] Lab, [address] [place], [place] [address]  Differential     Status: None   Collection Time: 07/14/23  3:14 AM  Result Value Ref Range   Neutrophils Relative % 64 %   Neutro Abs 6.7 1.7 - 7.7 K/uL   Lymphocytes Relative 22 %   Lymphs Abs 2.3 0.7 - 4.0 K/uL   Monocytes Relative 8 %   Monocytes Absolute 0.9 0.1 - 1.0 K/uL   Eosinophils Relative 5 %   Eosinophils Absolute 0.5 0.0 - 0.5 K/uL   Basophils Relative 1 %   Basophils Absolute 0.1 0.0 - 0.1 K/uL   Immature Granulocytes 0 %   Abs Immature  Granulocytes 0.04 0.00 - 0.07 K/uL    Comment: Performed at [facility] Lab, [address] [place], [place] [address]  Comprehensive metabolic panel     Status: Abnormal   Collection Time: 07/14/23  3:14 AM  Result Value Ref Range   Sodium 140 135 - 145 mmol/L   Potassium 4.0 3.5 - 5.1 mmol/L   Chloride 102 98 - 111 mmol/L   CO2 24 22 - 32 mmol/L   Glucose, Bld 122 (H) 70 - 99 mg/dL    Comment: Glucose reference range applies only to samples taken after fasting for at least 8 hours.   BUN 29 (H) 8 - 23 mg/dL   Creatinine, Ser 2.95 (H) 0.61 - 1.24 mg/dL   Calcium 9.6 8.9 - 28.4 mg/dL   Total Protein 7.3 6.5 - 8.1 g/dL   Albumin 4.0 3.5 - 5.0 g/dL   AST 21 15 - 41 U/L   ALT 24 0 - 44 U/L   Alkaline Phosphatase 64 38 - 126 U/L   Total Bilirubin 0.6 <1.2 mg/dL   GFR, Estimated 39 (L) >60 mL/min    Comment: (NOTE) Calculated using the CKD-EPI Creatinine Equation (2021)    Anion gap 14 5 - 15    Comment: Performed at [facility] Lab, [address], [place], [place] [address]  Brain natriuretic peptide     Status: None   Collection Time: 07/14/23  3:14 AM  Result Value Ref Range   B Natriuretic Peptide 27.5 0.0 - 100.0 pg/mL    Comment: Performed at [facility] Lab, [address], [place], [place] [address]  I-stat chem 8, ED     Status: Abnormal   Collection Time: 07/14/23  3:16 AM  Result Value Ref Range   Sodium 142 135 - 145 mmol/L   Potassium 4.1 3.5 - 5.1 mmol/L   Chloride 105 98 - 111 mmol/L   BUN 35 (H) 8 - 23 mg/dL   Creatinine, Ser 2.53 (H) 0.61 - 1.24 mg/dL   Glucose, Bld 664 (H) 70 - 99 mg/dL    Comment: Glucose  reference range applies only to samples taken after fasting for at least 8 hours.   Calcium, Ion 1.06 (L) 1.15 - 1.40 mmol/L   TCO2 27 22 - 32 mmol/L   Hemoglobin 16.0 13.0 - 17.0 g/dL   HCT 69.6 29.5 - 28.4 %  Urine rapid drug screen (hosp performed)     Status: None   Collection Time: 07/14/23  5:28 AM  Result Value Ref Range   Opiates NONE  DETECTED NONE DETECTED   Cocaine NONE DETECTED NONE DETECTED   Benzodiazepines NONE DETECTED NONE DETECTED   Amphetamines NONE DETECTED NONE DETECTED   Tetrahydrocannabinol NONE DETECTED NONE DETECTED   Barbiturates NONE DETECTED NONE DETECTED    Comment: (NOTE) DRUG SCREEN FOR MEDICAL PURPOSES ONLY.  IF CONFIRMATION IS NEEDED FOR ANY PURPOSE, NOTIFY LAB WITHIN 5 DAYS.  LOWEST DETECTABLE LIMITS FOR URINE DRUG SCREEN Drug Class                     Cutoff (ng/mL) Amphetamine and metabolites    1000 Barbiturate and metabolites    200 Benzodiazepine                 200 Opiates and metabolites        300 Cocaine and metabolites        300 THC                            50 Performed at [facility] Lab, [address], [place], [place] [address]   Urinalysis, Routine w reflex microscopic -Urine, Clean Catch     Status: None   Collection Time: 07/14/23  5:28 AM  Result Value Ref Range   Color, Urine YELLOW YELLOW   APPearance CLEAR CLEAR   Specific Gravity, Urine 1.019 1.005 - 1.030   pH 5.0 5.0 - 8.0   Glucose, UA NEGATIVE NEGATIVE mg/dL   Hgb urine dipstick NEGATIVE NEGATIVE   Bilirubin Urine NEGATIVE NEGATIVE   Ketones, ur NEGATIVE NEGATIVE mg/dL   Protein, ur NEGATIVE NEGATIVE mg/dL   Nitrite NEGATIVE NEGATIVE   Leukocytes,Ua NEGATIVE NEGATIVE    Comment: Performed at [facility], [address], [place], [place] [address]  I-Stat arterial blood gas, ED [facility] ED, [facility], [facility])     Status: Abnormal   Collection Time: 07/14/23  6:01 AM  Result Value Ref Range   pH, Arterial 7.348 (L) 7.35 - 7.45   pCO2 arterial 52.2 (H) 32 - 48 mmHg   pO2, Arterial 64 (L) 83 - 108 mmHg   Bicarbonate 28.8 (H) 20.0 - 28.0 mmol/L   TCO2 30 22 - 32 mmol/L   O2 Saturation 91 %   Acid-Base Excess 2.0 0.0 - 2.0 mmol/L   Sodium 142 135 - 145 mmol/L   Potassium 3.5 3.5 - 5.1 mmol/L   Calcium, Ion 1.31 1.15 - 1.40 mmol/L   HCT 44.0 39.0 - 52.0 %   Hemoglobin 15.0 13.0 - 17.0 g/dL   Patient  temperature 98.1 F    Collection site RADIAL, ALLEN'S TEST ACCEPTABLE    Drawn by Operator    Sample type ARTERIAL   Ammonia     Status: None   Collection Time: 07/14/23  6:28 AM  Result Value Ref Range   Ammonia 31 9 - 35 umol/L    Comment: Performed at [facility] Lab, [address] [place], [place] [address]  TSH     Status: None  Collection Time: 07/14/23  6:28 AM  Result Value Ref Range   TSH 1.916 0.350 - 4.500 uIU/mL    Comment: Performed by a 3rd Generation assay with a functional sensitivity of <=0.01 uIU/mL. Performed at [facility] Lab, [address], [place], [place] [address]   Troponin I (High Sensitivity)     Status: None   Collection Time: 07/14/23 10:45 AM  Result Value Ref Range   Troponin I (High Sensitivity) 5 <18 ng/L    Comment: (NOTE) Elevated high sensitivity troponin I (hsTnI) values and significant  changes across serial measurements may suggest ACS but many other  chronic and acute conditions are known to elevate hsTnI results.  Refer to the "Links" section for chest pain algorithms and additional  guidance. Performed at [facility] Lab, [address], [place], [place] [address]   CBG monitoring, ED     Status: Abnormal   Collection Time: 07/14/23 11:54 AM  Result Value Ref Range   Glucose-Capillary 144 (H) 70 - 99 mg/dL    Comment: Glucose reference range applies only to samples taken after fasting for at least 8 hours.  Blood gas, venous     Status: Abnormal   Collection Time: 07/14/23 11:58 AM  Result Value Ref Range   pH, Ven 7.29 7.25 - 7.43   pCO2, Ven 69 (H) 44 - 60 mmHg   pO2, Ven <31 (LL) 32 - 45 mmHg    Comment: CRITICAL RESULT CALLED TO, READ BACK BY AND VERIFIED WITH: K. MOON, RN 1227 07/14/2023 BY MACEDA,J.    Bicarbonate 33.2 (H) 20.0 - 28.0 mmol/L   Acid-Base Excess 4.4 (H) 0.0 - 2.0 mmol/L   O2 Saturation 37.4 %   Patient temperature 37.0    Collection site R HAND    Drawn by [id number]     Comment: Performed at [facility] Lab, [address], [place], [place] [address]   DG Chest Portable 1 View  Result Date: 07/14/2023 CLINICAL DATA:  67 year old male with shortness of breath. Code stroke, left side weakness. EXAM: PORTABLE CHEST 1 VIEW COMPARISON:  Portable chest 07/02/2023. CTA head and neck 0331 hours today. FINDINGS: Portable AP semi upright view at 0418 hours. Low lung volumes. Stable cardiac and mediastinal contours, mediastinal lipomatosis partially visible on CTA today. Visualized tracheal air column is within normal limits. Crowding of lung markings. No pneumothorax, pulmonary edema, pleural effusion or consolidation. IMPRESSION: Low lung volumes.  No other acute cardiopulmonary abnormality. Electronically Signed   By: Odessa Fleming M.D.   On: 07/14/2023 04:50   CT ANGIO HEAD NECK W WO CM (CODE STROKE)  Result Date: 07/14/2023 CLINICAL DATA:  Left-sided weakness EXAM: CT ANGIOGRAPHY HEAD AND NECK WITH AND WITHOUT CONTRAST TECHNIQUE: Multidetector CT imaging of the head and neck was performed using the standard protocol during bolus administration of intravenous contrast. Multiplanar CT image reconstructions and MIPs were obtained to evaluate the vascular anatomy. Carotid stenosis measurements (when applicable) are obtained utilizing NASCET criteria, using the distal internal carotid diameter as the denominator. RADIATION DOSE REDUCTION: This exam was performed according to the departmental dose-optimization program which includes automated exposure control, adjustment of the mA and/or kV according to patient size and/or use of iterative reconstruction technique. CONTRAST:  75mL OMNIPAQUE IOHEXOL 350 MG/ML SOLN COMPARISON:  None Available. FINDINGS: CTA NECK FINDINGS SKELETON: No acute abnormality or high grade bony spinal canal stenosis. OTHER NECK: Debris throughout the cervical esophagus UPPER CHEST: No pneumothorax or pleural effusion. No nodules  or masses. AORTIC ARCH: There is calcific atherosclerosis of the  aortic arch. Normal variant aortic arch branching pattern with the brachiocephalic and left common carotid arteries sharing a common origin. RIGHT CAROTID SYSTEM: No dissection, occlusion or aneurysm. Mild atherosclerotic calcification at the carotid bifurcation without hemodynamically significant stenosis. LEFT CAROTID SYSTEM: No dissection, occlusion or aneurysm. There is mixed density atherosclerosis extending into the proximal ICA, resulting in less than 50% stenosis. VERTEBRAL ARTERIES: Left dominant configuration. There is no dissection, occlusion or flow-limiting stenosis to the skull base (V1-V3 segments). CTA HEAD FINDINGS POSTERIOR CIRCULATION: Vertebral arteries are normal. No proximal occlusion of the anterior or inferior cerebellar arteries. Basilar artery is normal. Superior cerebellar arteries are normal. Posterior cerebral arteries are normal. ANTERIOR CIRCULATION: Intracranial internal carotid arteries are normal. Anterior cerebral arteries are normal. Middle cerebral arteries are normal. VENOUS SINUSES: As permitted by contrast timing, patent. ANATOMIC VARIANTS: None Review of the MIP images confirms the above findings. IMPRESSION: 1. No emergent large vessel occlusion or hemodynamically significant stenosis of the head or neck. 2. Debris throughout the cervical esophagus, suggestive of gastroesophageal reflux. Aortic Atherosclerosis (ICD10-I70.0). Electronically Signed   By: Deatra Robinson M.D.   On: 07/14/2023 03:45   CT HEAD CODE STROKE WO CONTRAST  Result Date: 07/14/2023 CLINICAL DATA:  Code stroke.  Left-sided weakness EXAM: CT HEAD WITHOUT CONTRAST TECHNIQUE: Contiguous axial images were obtained from the base of the skull through the vertex without intravenous contrast. RADIATION DOSE REDUCTION: This exam was performed according to the departmental dose-optimization program which includes automated exposure control, adjustment of the mA and/or kV according to patient size and/or use of  iterative reconstruction technique. COMPARISON:  None Available. FINDINGS: Brain: There is no mass, hemorrhage or extra-axial collection. The size and configuration of the ventricles and extra-axial CSF spaces are normal. The brain parenchyma is normal, without evidence of acute or chronic infarction. Vascular: No abnormal hyperdensity of the major intracranial arteries or dural venous sinuses. No intracranial atherosclerosis. Skull: The visualized skull base, calvarium and extracranial soft tissues are normal. Sinuses/Orbits: No fluid levels or advanced mucosal thickening of the visualized paranasal sinuses. No mastoid or middle ear effusion. The orbits are normal. ASPECTS [facility] Stroke Program Early CT Score) - Ganglionic level infarction (caudate, lentiform nuclei, internal capsule, insula, M1-M3 cortex): 7 - Supraganglionic infarction (M4-M6 cortex): 3 Total score (0-10 with 10 being normal): 10 IMPRESSION: 1. No acute intracranial abnormality. 2. ASPECTS is 10. These results were communicated to Dr. Erick Blinks at 3:35 am on 07/14/2023 by text page via the [facility] messaging system. Electronically Signed   By: Deatra Robinson M.D.   On: 07/14/2023 03:37    Pending Labs Unresulted Labs (From admission, onward)     Start     Ordered   07/15/23 0500  CBC  Tomorrow morning,   R        07/14/23 0830   07/15/23 0500  Basic metabolic panel  Tomorrow morning,   R        07/14/23 0830            Vitals/Pain Today's Vitals   07/14/23 1230 07/14/23 1412 07/14/23 1515 07/14/23 1530  BP:   106/72 110/71  Pulse: 76  74 75  Resp:    15  Temp:  97.7 F (36.5 C)    TempSrc:  Oral    SpO2: 96%  98% 98%  Weight:      Height:      PainSc:  Isolation Precautions No active isolations  Medications Medications  sodium chloride flush (NS) 0.9 % injection 3 mL (3 mLs Intravenous Not Given 07/14/23 0918)  acetaminophen (TYLENOL) tablet 650 mg (has no administration in time range)    Or   acetaminophen (TYLENOL) suppository 650 mg (has no administration in time range)  albuterol (PROVENTIL) (2.5 MG/3ML) 0.083% nebulizer solution 2.5 mg (has no administration in time range)  0.9 %  sodium chloride infusion ( Intravenous New Bag/Given 07/14/23 0922)  insulin aspart (novoLOG) injection 0-9 Units (1 Units Subcutaneous Given 07/14/23 1230)  levETIRAcetam (KEPPRA) tablet 500 mg (500 mg Oral Given 07/14/23 1253)  apixaban (ELIQUIS) tablet 5 mg (5 mg Oral Given 07/14/23 1253)  LORazepam (ATIVAN) injection 0.5 mg (has no administration in time range)  iohexol (OMNIPAQUE) 350 MG/ML injection 75 mL (75 mLs Intravenous Contrast Given 07/14/23 0331)    Mobility walks with device     Focused Assessments Neuro Assessment Handoff:  Swallow screen pass? No  Cardiac Rhythm: Atrial fibrillation NIH Stroke Scale  Dizziness Present: No Headache Present: No Interval: Shift assessment Level of Consciousness (1a.)   : Alert, keenly responsive LOC Questions (1b. )   : Answers both questions correctly LOC Commands (1c. )   : Performs both tasks correctly Best Gaze (2. )  : Normal Visual (3. )  : No visual loss Facial Palsy (4. )    : Normal symmetrical movements Motor Arm, Left (5a. )   : Drift Motor Arm, Right (5b. ) : No drift Motor Leg, Left (6a. )  : Some effort against gravity Motor Leg, Right (6b. ) : No drift Limb Ataxia (7. ): Present in one limb Sensory (8. )  : Mild-to-moderate sensory loss, patient feels pinprick is less sharp or is dull on the affected side, or there is a loss of superficial pain with pinprick, but patient is aware of being touched Best Language (9. )  : No aphasia Dysarthria (10. ): Normal Extinction/Inattention (11.)   : No Abnormality Complete NIHSS TOTAL: 5 Last date known well: 07/13/23 Last time known well: 2230 Neuro Assessment: Exceptions to WDL Neuro Checks:   Initial (07/14/23 0302)  Has TPA been given? No If patient is a Neuro Trauma and patient  is going to OR before floor call report to 4N Charge nurse: [phone] or [phone]   R Recommendations: See Admitting Provider Note  Report given to:   Additional Notes: .

## 2023-07-14 NOTE — Consult Note (Addendum)
NEUROLOGY CONSULT NOTE   Date of service: July 14, 2023 Patient Name: Lawrence [name] MRN:  [id number] DOB:  September 22, 1955 Chief Complaint: "somnolence, worsening of his baseline L sided weakness" Requesting Provider: Glynn Octave, MD  History of Present Illness  Lawrence [name] is a 67 y.o. male  has a past medical history of Acquired dilation of ascending aorta and aortic root (HCC), Adenomatous colon polyp (2007), Anemia, Anxiety, Asthma, BPH without obstruction/lower urinary tract symptoms (02/22/2017), Chronic diastolic (congestive) heart failure (HCC), Chronic venous stasis (03/07/2019), COPD (chronic obstructive pulmonary disease) (HCC), Coronary artery calcification seen on CAT scan, Depression, Diabetic neuropathy (HCC) (09/11/2019), History of colon polyps (08/24/2018), Hypertension, Morbid obesity (HCC), OSA (obstructive sleep apnea), Pain due to onychomycosis of toenails of both feet (09/11/2019), Peripheral neuropathy (02/22/2017), Primary osteoarthritis, left shoulder (03/05/2017), PTSD (post-traumatic stress disorder), Pure hypercholesterolemia, QT prolongation (03/07/2019), S/P TAVR (transcatheter aortic valve replacement) (02/07/2023), Seizures (HCC), Severe aortic stenosis, Sinus tachycardia (03/07/2019), Sleep apnea, and Type 2 diabetes mellitus with vascular disease (HCC) (09/11/2019). Has residual left-sided weakness from prior strokes, brought in by EMS when they were called to evaluate him for unresponsiveness. Last see normal was 2230 when he was seen by his wife. He slept in a recliner and wife found him slid out of the recliner in AM and called EMS. He is on eliquis and recently came in with similar presentation and was found to have a small L cerebellar stroke and therefore EMS brought him in as a code stroke.  He was recently admitted for a L cerebellar stroke and discharged home after rehab.  LKW: 2230 on 07/13/23 Modified rankin score: 4-Needs assistance to walk and  tend to bodily needs IV Thrombolysis: not offered, on eliquis EVT: not offered 2/2 no LVO.  NIHSS components Score: Comment  1a Level of Conscious 0[]  1[x]  2[]  3[]      1b LOC Questions 0[x]  1[]  2[]       1c LOC Commands 0[x]  1[]  2[]       2 Best Gaze 0[x]  1[]  2[]       3 Visual 0[x]  1[]  2[]  3[]      4 Facial Palsy 0[x]  1[]  2[]  3[]      5a Motor Arm - left 0[]  1[x]  2[]  3[]  4[]  UN[]    5b Motor Arm - Right 0[x]  1[]  2[]  3[]  4[]  UN[]    6a Motor Leg - Left 0[]  1[]  2[x]  3[]  4[]  UN[]    6b Motor Leg - Right 0[]  1[x]  2[]  3[]  4[]  UN[]    7 Limb Ataxia 0[x]  1[]  2[]  3[]  UN[]     8 Sensory 0[]  1[x]  2[]  UN[]      9 Best Language 0[x]  1[]  2[]  3[]      10 Dysarthria 0[x]  1[]  2[]  UN[]      11 Extinct. and Inattention 0[x]  1[]  2[]       TOTAL: 6      ROS  Comprehensive ROS performed and pertinent positives documented in HPI   Past History   Past Medical History:  Diagnosis Date   Acquired dilation of ascending aorta and aortic root (HCC)    40mm by echo 01/2021   Adenomatous colon polyp 2007   Anemia    Anxiety    Asthma    BPH without obstruction/lower urinary tract symptoms 02/22/2017   Chronic diastolic (congestive) heart failure (HCC)    Chronic venous stasis 03/07/2019   COPD (chronic obstructive pulmonary disease) (HCC)    Coronary artery calcification seen on CAT scan    Depression    Diabetic neuropathy (HCC) 09/11/2019  History of colon polyps 08/24/2018   Hypertension    Morbid obesity (HCC)    OSA (obstructive sleep apnea)    Pain due to onychomycosis of toenails of both feet 09/11/2019   Peripheral neuropathy 02/22/2017   Primary osteoarthritis, left shoulder 03/05/2017   PTSD (post-traumatic stress disorder)    Pure hypercholesterolemia    QT prolongation 03/07/2019   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 Lynnette Caffey and Dr. Laneta Simmers   Seizures [facility])    Severe aortic stenosis    Sinus tachycardia 03/07/2019   Sleep  apnea    CPAP   Type 2 diabetes mellitus with vascular disease (HCC) 09/11/2019    Past Surgical History:  Procedure Laterality Date   ENDOVENOUS ABLATION SAPHENOUS VEIN W/ LASER Right 08/20/2020   endovenous laser ablation right greater saphenous vein by Cari Caraway MD    ENDOVENOUS ABLATION SAPHENOUS VEIN W/ LASER Left 11/16/2022   endovenous laser ablation left greater saphenous vein by Cari Caraway MD   INTRAOPERATIVE TRANSTHORACIC ECHOCARDIOGRAM N/A 02/07/2023   Procedure: INTRAOPERATIVE TRANSTHORACIC ECHOCARDIOGRAM;  Surgeon: Orbie Pyo, MD;  Location: [facility] INVASIVE CV LAB;  Service: Open Heart Surgery;  Laterality: N/A;   JOINT REPLACEMENT     left knee replacement x 2   KNEE ARTHROSCOPY Bilateral    LEFT HEART CATH AND CORONARY ANGIOGRAPHY N/A 01/17/2018   Procedure: LEFT HEART CATH AND CORONARY ANGIOGRAPHY;  Surgeon: Marykay Lex, MD;  Location: [facility] INVASIVE CV LAB;  Service: Cardiovascular;  Laterality: N/A;   PRESSURE SENSOR/CARDIOMEMS N/A 08/26/2021   Procedure: PRESSURE SENSOR/CARDIOMEMS;  Surgeon: Laurey Morale, MD;  Location: [facility] INVASIVE CV LAB;  Service: Cardiovascular;  Laterality: N/A;   RIGHT HEART CATH N/A 08/26/2021   Procedure: RIGHT HEART CATH;  Surgeon: Laurey Morale, MD;  Location: [facility] INVASIVE CV LAB;  Service: Cardiovascular;  Laterality: N/A;   RIGHT HEART CATH AND CORONARY ANGIOGRAPHY N/A 01/26/2023   Procedure: RIGHT HEART CATH AND CORONARY ANGIOGRAPHY;  Surgeon: Orbie Pyo, MD;  Location: [facility] INVASIVE CV LAB;  Service: Cardiovascular;  Laterality: N/A;   TEE WITHOUT CARDIOVERSION N/A 08/26/2021   Procedure: TRANSESOPHAGEAL ECHOCARDIOGRAM (TEE);  Surgeon: Laurey Morale, MD;  Location: [facility] ENDOSCOPY;  Service: Cardiovascular;  Laterality: N/A;   TOOTH EXTRACTION N/A 02/03/2023   Procedure: DENTAL RESTORATION/EXTRACTIONS;  Surgeon: [name], DMD;  Location: [facility] OR;  Service: Oral Surgery;  Laterality: N/A;   TRANSCATHETER AORTIC VALVE  REPLACEMENT, TRANSFEMORAL Right 02/07/2023   Procedure: Transcatheter Aortic Valve Replacement, Transfemoral;  Surgeon: Orbie Pyo, MD;  Location: [facility] INVASIVE CV LAB;  Service: Open Heart Surgery;  Laterality: Right;   UMBILICAL HERNIA REPAIR      Family History: Family History  Problem Relation Age of Onset   CAD Maternal Grandfather    Diabetes Other    Diabetes Mellitus II Neg Hx    Colon cancer Neg Hx    Esophageal cancer Neg Hx    Inflammatory bowel disease Neg Hx    Liver disease Neg Hx    Pancreatic cancer Neg Hx    Rectal cancer Neg Hx    Stomach cancer Neg Hx    Sleep apnea Neg Hx     Social History  reports that he quit smoking about 7 years ago. His smoking use included cigarettes. He started smoking about 51 years ago. He has a 44 pack-year smoking history. He has been exposed to tobacco smoke. He has never used  smokeless tobacco. He reports current alcohol use. He reports that he does not use drugs.  Allergies  Allergen Reactions   Vancomycin Other (See Comments)    "Red Man Syndrome" 02/02/17: possible cause for rash under both arms   Niacin And Related Other (See Comments)    Red man syndrome   Tubersol [Tuberculin, Ppd] Other (See Comments)    Reaction unknown   Doxycycline Rash and Other (See Comments)    Medications  No current facility-administered medications for this encounter.  Current Outpatient Medications:    acetaminophen (TYLENOL) 650 MG CR tablet, Take 1 tablet (650 mg total) by mouth every 8 (eight) hours as needed for pain., Disp: , Rfl:    albuterol (VENTOLIN HFA) 108 (90 Base) MCG/ACT inhaler, Inhale 2 puffs into the lungs every 6 (six) hours as needed for wheezing or shortness of breath., Disp: 1 each, Rfl: 6   allopurinol (ZYLOPRIM) 100 MG tablet, Take 1 tablet (100 mg total) by mouth daily for 6 days, THEN 1 tablet (100 mg total) 2 (two) times daily for 7 days, THEN 1.5 tablets (150 mg total) 2 (two) times daily for 7 days, THEN 2  tablets (200 mg total) 2 (two) times daily., Disp: , Rfl:    apixaban (ELIQUIS) 5 MG TABS tablet, Take 1 tablet (5 mg total) by mouth 2 (two) times daily., Disp: 180 tablet, Rfl: 3   ARIPiprazole (ABILIFY) 5 MG tablet, Take 1 tablet (5 mg total) by mouth at bedtime., Disp: 60 tablet, Rfl: 0   busPIRone (BUSPAR) 10 MG tablet, Take 1 tablet (10 mg total) by mouth 3 (three) times daily., Disp: 270 tablet, Rfl: 0   Cholecalciferol (VITAMIN D3 PO), Take 2,000 Units by mouth daily., Disp: , Rfl:    colchicine 0.6 MG tablet, Take 1 tablet (0.6 mg total) by mouth daily., Disp: 30 tablet, Rfl: 0   cyanocobalamin (VITAMIN B12) 1000 MCG tablet, Take 1,000 mcg by mouth daily., Disp: , Rfl:    fenofibrate (TRICOR) 145 MG tablet, Take 1 tablet (145 mg total) by mouth daily., Disp: 90 tablet, Rfl: 3   ferrous sulfate 324 (65 Fe) MG TBEC, Take 324 mg by mouth daily with breakfast. , Disp: , Rfl:    fluticasone (FLONASE) 50 MCG/ACT nasal spray, Place 2 sprays into both nostrils daily. , Disp: , Rfl:    Fluticasone-Umeclidin-Vilant (TRELEGY ELLIPTA) 200-62.5-25 MCG/ACT AEPB, INHALE 1 PUFF BY MOUTH INTO LUNGS ONCE DAILY, Disp: 60 each, Rfl: 11   icosapent Ethyl (VASCEPA) 1 g capsule, Take 2 capsules (2 g total) by mouth 2 (two) times daily., Disp: 120 capsule, Rfl: 3   insulin aspart (NOVOLOG FLEXPEN) 100 UNIT/ML FlexPen, INJECT 8 UNITS BEFORE MEALS, keep sugar TWO HOURS AFTER meals UNDER 180 AT least, Disp: 15 mL, Rfl: 3   insulin degludec (TRESIBA FLEXTOUCH) 100 UNIT/ML FlexTouch Pen, Inject 30 Units into the skin daily., Disp: , Rfl:    Insulin Pen Needle 32G X 4 MM MISC, Use as directed, Disp: 100 each, Rfl: 0   KLOR-CON M20 20 MEQ tablet, TAKE 2 TABLETS (40 MEQ TOTAL) BY MOUTH 2 (TWO) TIMES DAILY. EXCEPT TUESDAYS WHEN YOU TAKE ( 3 TABS), Disp: 351 tablet, Rfl: 3   Lacosamide 150 MG TABS, Take 1 tablet (150 mg total) by mouth 2 (two) times daily., Disp: 180 tablet, Rfl: 3   levETIRAcetam (KEPPRA) 500 MG  tablet, Take 1 tablet (500 mg total) by mouth 2 (two) times daily., Disp: 180 tablet, Rfl: 3   Menthol-Methyl  Salicylate (MUSCLE RUB) 10-15 % CREA, Apply 1 Application topically as needed for muscle pain (to knees for pain)., Disp: , Rfl:    methocarbamol (ROBAXIN) 500 MG tablet, Take 1 tablet (500 mg total) by mouth every 8 (eight) hours as needed for muscle spasms., Disp: 60 tablet, Rfl: 0   metoprolol succinate (TOPROL-XL) 25 MG 24 hr tablet, Take 1 tablet (25 mg total) by mouth daily., Disp: 30 tablet, Rfl: 0   omeprazole (PRILOSEC) 20 MG capsule, Take 20 mg by mouth every morning., Disp: , Rfl:    polyethylene glycol powder (GLYCOLAX/MIRALAX) 17 GM/SCOOP powder, Mix in water as directed and take 1 capful (17 g) by mouth daily., Disp: 238 g, Rfl: 0   pregabalin (LYRICA) 150 MG capsule, Take 1 capsule (150 mg total) by mouth 3 (three) times daily., Disp: 270 capsule, Rfl: 3   rosuvastatin (CRESTOR) 40 MG tablet, Take 1 tablet (40 mg total) by mouth daily., Disp: 30 tablet, Rfl: 0   senna-docusate (SENOKOT-S) 8.6-50 MG tablet, Take 1 tablet by mouth at bedtime., Disp: 30 tablet, Rfl: 0   tirzepatide (MOUNJARO) 15 MG/0.5ML Pen, INJECT 15 MG INTO THE SKIN ONCE A WEEK. (Patient taking differently: 15 mg once a week. Inject on Monday), Disp: 6 mL, Rfl: 0   torsemide (DEMADEX) 20 MG tablet, Take 6 tablets (120 mg total) by mouth 2 (two) times daily., Disp: , Rfl:    traZODone (DESYREL) 100 MG tablet, Take 0.5 tablets (50 mg total) by mouth at bedtime., Disp: , Rfl:    venlafaxine XR (EFFEXOR-XR) 75 MG 24 hr capsule, Take 3 capsules (225 mg total) by mouth daily with breakfast., Disp: 270 capsule, Rfl: 0  Vitals  There were no vitals filed for this visit.  There is no height or weight on file to calculate BMI.  Physical Exam  General: Laying comfortably in bed; in no acute distress.  HENT: Normal oropharynx and mucosa. Normal external appearance of ears and nose.  Neck: Supple, no pain or tenderness   CV: No JVD. No peripheral edema.  Pulmonary: Symmetric Chest rise. Normal respiratory effort.  Abdomen: Soft to touch, non-tender.  Ext: No cyanosis, edema, or deformity  Skin: No rash. Normal palpation of skin.   Musculoskeletal: Normal digits and nails by inspection. No clubbing.   Neurologic Examination  Mental status/Cognition: somnolent, oriented to self, place, month and year, good attention.  Speech/language: Fluent, comprehension intact, object naming intact, repetition intact.  Cranial nerves:   CN II Pupils equal and reactive to light, no VF deficits    CN III,IV,VI EOM intact, no gaze preference or deviation, no nystagmus    CN V normal sensation in V1, V2, and V3 segments bilaterally    CN VII no asymmetry, no nasolabial fold flattening    CN VIII normal hearing to speech    CN IX & X normal palatal elevation, no uvular deviation    CN XI 5/5 head turn and 5/5 shoulder shrug bilaterally    CN XII midline tongue protrusion    Motor:  Muscle bulk: normal, tone normao, pronator drift noted in LUE. Mvmt Root Nerve  Muscle Right Left Comments  SA C5/6 Ax Deltoid     EF C5/6 Mc Biceps 5 4   EE C6/7/8 Rad Triceps 5 4   WF C6/7 Med FCR     WE C7/8 PIN ECU     F Ab C8/T1 U ADM/FDI 5 4   HF L1/2/3 Fem Illopsoas 4+ 4   KE L2/3/4  Fem Quad     DF L4/5 D Peron Tib Ant 5 5   PF S1/2 Tibial Grc/Sol 5 5    Sensation:  Light touch Decreased slightly on the left to light touch   Pin prick    Temperature    Vibration   Proprioception    Coordination/Complex Motor:  - Finger to Nose intact BL - Heel to shin unable to do due to body habitus. - Rapid alternating movement are slowed throughout - Gait: deferred.   Labs/Imaging/Neurodiagnostic studies   CBC:  Recent Labs  Lab Jul 17, 2023 2319 07/11/23 0453  WBC 9.6 10.2  HGB 14.1 13.9  HCT 43.3 43.5  MCV 89.6 89.9  PLT 247 246   Basic Metabolic Panel:  Lab Results  Component Value Date   NA 140 07/11/2023   K 3.7  07/11/2023   CO2 27 07/11/2023   GLUCOSE 109 (H) 07/11/2023   BUN 18 07/11/2023   CREATININE 1.10 07/11/2023   CALCIUM 8.9 07/11/2023   GFRNONAA >60 07/11/2023   GFRAA 77 05/20/2020   Lipid Panel:  Lab Results  Component Value Date   LDLCALC 79 06/25/2023   HgbA1c:  Lab Results  Component Value Date   HGBA1C 6.5 (H) 06/26/2023   Urine Drug Screen:     Component Value Date/Time   LABOPIA NONE DETECTED 06/24/2023 2129   COCAINSCRNUR NONE DETECTED 06/24/2023 2129   LABBENZ NONE DETECTED 06/24/2023 2129   AMPHETMU NONE DETECTED 06/24/2023 2129   THCU NONE DETECTED 06/24/2023 2129   LABBARB NONE DETECTED 06/24/2023 2129    Alcohol Level     Component Value Date/Time   ETH <10 06/24/2023 2109   INR  Lab Results  Component Value Date   INR 1.0 06/24/2023   APTT  Lab Results  Component Value Date   APTT 29 06/24/2023   AED levels:  Lab Results  Component Value Date   LEVETIRACETA 23.2 09/10/2021    CT Head without contrast(Personally reviewed): CTH was negative for a large hypodensity concerning for a large territory infarct or hyperdensity concerning for an ICH  CT angio Head and Neck with contrast(Personally reviewed): No LVO.  MRI Brain: pending  Neurodiagnostics rEEG:  pending  ASSESSMENT   NATHANIE DETTLING is a 67 y.o. male with obesity, prior stroke, afibb on eliquis, recent L cerebellar stroke p/w somnolence and worsening left sided weakness. Noted weakness seems stable when compared to prior neuro exams. He is somnolent but no witnessed seizure like activity noted. He is following commands and unlikely to be in subclinical seizures. Somnolence could also be due to sedating medications. He had similar presentation back in nov 2024 and at that time, MRI Brain demonstrated a L cerebellar stroke.  Will get MRI brain and routine EEG to evaluate but no further workup if negative.  RECOMMENDATIONS  - MRI brain w/o C - rEEG - no further workup if  negative. - we will follow up on MRI Brain and rEEG but do not plan to see him again. IF the primary team would like [place] to follow along or re-evaluate, please reach out to [place]. ______________________________________________________________________    Signed, Erick Blinks, MD [facility]

## 2023-07-14 NOTE — ED Notes (Signed)
MRI unable to complete diagnostics. Says there is a stimulator present and pt has no remote to place in MRI safe mode. Also sts device would need to be fully charged. ED MD notified.

## 2023-07-14 NOTE — ED Provider Notes (Signed)
[place] EMERGENCY DEPARTMENT AT [facility] Provider Note   CSN: [id number] Arrival date & time: 07/14/23  7829     History  No chief complaint on file.   Lawrence [name] is a 67 y.o. male.  5 caveat for acuity of condition.  Patient brought in his code stroke by EMS.  He has a history of hypertension, obesity, COPD, diabetes, previous stroke with left-sided weakness.  Aortic valve replacement, on Eliquis. EMS was called as patient slid out of his recliner to the ground and needed assistance getting up.  No syncope.  No head injury.  EMS noted that he had left-sided weakness involving his arm and his leg and code stroke was activated.  Last seen normal was 10:30 PM.  Denies any headache.  Denies hitting his head.  No chest pain or shortness of breath.  Does feel weaker on his left side than usual but does have left-sided weakness from previous stroke.  Seen on arrival with Dr. Derry Lory of neurology  The history is provided by the patient and the EMS personnel. The history is limited by the condition of the patient.       Home Medications Prior to Admission medications   Medication Sig Start Date End Date Taking? Authorizing Provider  acetaminophen (TYLENOL) 650 MG CR tablet Take 1 tablet (650 mg total) by mouth every 8 (eight) hours as needed for pain. 07/10/23   Love, Evlyn Kanner, PA-C  albuterol (VENTOLIN HFA) 108 (90 Base) MCG/ACT inhaler Inhale 2 puffs into the lungs every 6 (six) hours as needed for wheezing or shortness of breath. 04/17/23   Hunsucker, Lesia Sago, MD  allopurinol (ZYLOPRIM) 100 MG tablet Take 1 tablet (100 mg total) by mouth daily for 6 days, THEN 1 tablet (100 mg total) 2 (two) times daily for 7 days, THEN 1.5 tablets (150 mg total) 2 (two) times daily for 7 days, THEN 2 tablets (200 mg total) 2 (two) times daily. 07/12/23 07/26/24  Jacquelynn Cree, PA-C  apixaban (ELIQUIS) 5 MG TABS tablet Take 1 tablet (5 mg total) by mouth 2 (two) times daily. 04/17/23    Laurey Morale, MD  ARIPiprazole (ABILIFY) 5 MG tablet Take 1 tablet (5 mg total) by mouth at bedtime. 06/08/23   Mozingo, Thereasa Solo, NP  busPIRone (BUSPAR) 10 MG tablet Take 1 tablet (10 mg total) by mouth 3 (three) times daily. 04/17/23   Mozingo, Thereasa Solo, NP  Cholecalciferol (VITAMIN D3 PO) Take 2,000 Units by mouth daily.    [provider]  colchicine 0.6 MG tablet Take 1 tablet (0.6 mg total) by mouth daily. 07/12/23   Love, Evlyn Kanner, PA-C  cyanocobalamin (VITAMIN B12) 1000 MCG tablet Take 1,000 mcg by mouth daily.    [provider]  fenofibrate (TRICOR) 145 MG tablet Take 1 tablet (145 mg total) by mouth daily. 04/17/23   Laurey Morale, MD  ferrous sulfate 324 (65 Fe) MG TBEC Take 324 mg by mouth daily with breakfast.     [provider]  fluticasone (FLONASE) 50 MCG/ACT nasal spray Place 2 sprays into both nostrils daily.     [provider]  Fluticasone-Umeclidin-Vilant (TRELEGY ELLIPTA) 200-62.5-25 MCG/ACT AEPB INHALE 1 PUFF BY MOUTH INTO LUNGS ONCE DAILY 08/18/22   Hunsucker, Lesia Sago, MD  icosapent Ethyl (VASCEPA) 1 g capsule Take 2 capsules (2 g total) by mouth 2 (two) times daily. 04/17/23   Laurey Morale, MD  insulin aspart (NOVOLOG FLEXPEN) 100 UNIT/ML FlexPen INJECT  8 UNITS BEFORE MEALS, keep sugar TWO HOURS AFTER meals UNDER 180 AT least 07/11/23   Love, Pamela S, PA-C  insulin degludec (TRESIBA FLEXTOUCH) 100 UNIT/ML FlexTouch Pen Inject 30 Units into the skin daily. 07/11/23   Love, Evlyn Kanner, PA-C  Insulin Pen Needle 32G X 4 MM MISC Use as directed 07/12/23   Love, Evlyn Kanner, PA-C  KLOR-CON M20 20 MEQ tablet TAKE 2 TABLETS (40 MEQ TOTAL) BY MOUTH 2 (TWO) TIMES DAILY. EXCEPT TUESDAYS WHEN YOU TAKE ( 3 TABS) 07/13/23   Laurey Morale, MD  Lacosamide 150 MG TABS Take 1 tablet (150 mg total) by mouth 2 (two) times daily. 05/04/23   Windell Norfolk, MD  levETIRAcetam (KEPPRA) 500 MG tablet Take 1 tablet (500 mg total) by mouth 2  (two) times daily. 05/04/23   Windell Norfolk, MD  Menthol-Methyl Salicylate (MUSCLE RUB) 10-15 % CREA Apply 1 Application topically as needed for muscle pain (to knees for pain). 07/10/23   Love, Evlyn Kanner, PA-C  methocarbamol (ROBAXIN) 500 MG tablet Take 1 tablet (500 mg total) by mouth every 8 (eight) hours as needed for muscle spasms. 07/11/23   Love, Evlyn Kanner, PA-C  metoprolol succinate (TOPROL-XL) 25 MG 24 hr tablet Take 1 tablet (25 mg total) by mouth daily. 07/12/23   Love, Evlyn Kanner, PA-C  omeprazole (PRILOSEC) 20 MG capsule Take 20 mg by mouth every morning. 05/29/20   [provider]  polyethylene glycol powder (GLYCOLAX/MIRALAX) 17 GM/SCOOP powder Mix in water as directed and take 1 capful (17 g) by mouth daily. 07/12/23   Love, Evlyn Kanner, PA-C  pregabalin (LYRICA) 150 MG capsule Take 1 capsule (150 mg total) by mouth 3 (three) times daily. 05/04/23   Windell Norfolk, MD  rosuvastatin (CRESTOR) 40 MG tablet Take 1 tablet (40 mg total) by mouth daily. 07/12/23   Love, Evlyn Kanner, PA-C  senna-docusate (SENOKOT-S) 8.6-50 MG tablet Take 1 tablet by mouth at bedtime. 07/11/23   Love, Evlyn Kanner, PA-C  tirzepatide [facility]) 15 MG/0.5ML Pen INJECT 15 MG INTO THE SKIN ONCE A WEEK. Patient taking differently: 15 mg once a week. Inject on Monday 06/07/23   Altamese Dublin, MD  torsemide (DEMADEX) 20 MG tablet Take 6 tablets (120 mg total) by mouth 2 (two) times daily. 07/12/23   Love, Evlyn Kanner, PA-C  traZODone (DESYREL) 100 MG tablet Take 0.5 tablets (50 mg total) by mouth at bedtime. 07/10/23   Jacquelynn Cree, PA-C  venlafaxine XR (EFFEXOR-XR) 75 MG 24 hr capsule Take 3 capsules (225 mg total) by mouth daily with breakfast. 04/17/23   Mozingo, Thereasa Solo, NP      Allergies    Vancomycin; Niacin and related; Tubersol [tuberculin, ppd]; and Doxycycline    Review of Systems   Review of Systems  Constitutional:  Negative for activity change and appetite change.  HENT:  Negative for congestion.    Respiratory:  Negative for cough, chest tightness and shortness of breath.   Cardiovascular:  Negative for chest pain.  Gastrointestinal:  Negative for abdominal pain, nausea and vomiting.  Genitourinary:  Negative for dysuria and hematuria.  Musculoskeletal:  Negative for arthralgias and myalgias.  Skin:  Negative for rash.  Neurological:  Positive for weakness and numbness. Negative for dizziness and headaches.   all other systems are negative except as noted in the HPI and PMH.    Physical Exam Updated Vital Signs BP 116/84   Pulse 89   Temp 98.1 F (36.7 C) (Oral)  Resp 14   Ht 6\' 2"  (1.88 m)   Wt (!) 158.8 kg   SpO2 96%   BMI 44.94 kg/m  Physical Exam Vitals and nursing note reviewed.  Constitutional:      General: He is not in acute distress.    Appearance: He is well-developed. He is obese.     Comments: Somnolent but arousable, answers questions, oriented to person and place.  HENT:     Head: Normocephalic and atraumatic.     Mouth/Throat:     Pharynx: No oropharyngeal exudate.  Eyes:     Conjunctiva/sclera: Conjunctivae normal.     Pupils: Pupils are equal, round, and reactive to light.  Neck:     Comments: No meningismus. Cardiovascular:     Rate and Rhythm: Normal rate and regular rhythm.     Heart sounds: Normal heart sounds. No murmur heard. Pulmonary:     Effort: Pulmonary effort is normal. No respiratory distress.     Breath sounds: Normal breath sounds.  Abdominal:     Palpations: Abdomen is soft.     Tenderness: There is no abdominal tenderness. There is no guarding or rebound.  Musculoskeletal:        General: No tenderness. Normal range of motion.     Cervical back: Normal range of motion and neck supple.  Skin:    General: Skin is warm.  Neurological:     Mental Status: He is alert and oriented to person, place, and time.     Cranial Nerves: No cranial nerve deficit.     Motor: No abnormal muscle tone.     Coordination: Coordination  normal.     Comments: No facial droop.  Tongue is midline.  Left-sided weakness in grip strength with pronator drift of left arm.  Able to raise each leg off the bed bilaterally.  Visual fields full to confrontation  Psychiatric:        Behavior: Behavior normal.     ED Results / Procedures / Treatments   Labs (all labs ordered are listed, but only abnormal results are displayed) Labs Reviewed  COMPREHENSIVE METABOLIC PANEL - Abnormal; Notable for the following components:      Result Value   Glucose, Bld 122 (*)    BUN 29 (*)    Creatinine, Ser 1.86 (*)    GFR, Estimated 39 (*)    All other components within normal limits  I-STAT CHEM 8, ED - Abnormal; Notable for the following components:   BUN 35 (*)    Creatinine, Ser 2.00 (*)    Glucose, Bld 119 (*)    Calcium, Ion 1.06 (*)    All other components within normal limits  I-STAT ARTERIAL BLOOD GAS, ED - Abnormal; Notable for the following components:   pH, Arterial 7.348 (*)    pCO2 arterial 52.2 (*)    pO2, Arterial 64 (*)    Bicarbonate 28.8 (*)    All other components within normal limits  ETHANOL  PROTIME-INR  APTT  CBC  DIFFERENTIAL  URINALYSIS, ROUTINE W REFLEX MICROSCOPIC  BRAIN NATRIURETIC PEPTIDE  RAPID URINE DRUG SCREEN, HOSP PERFORMED  AMMONIA  TSH    EKG EKG Interpretation Date/Time:  Friday July 14 2023 03:45:49 EST Ventricular Rate:  82 PR Interval:  310 QRS Duration:  125 QT Interval:  400 QTC Calculation: 468 R Axis:   -55  Text Interpretation: Sinus rhythm Prolonged PR interval Nonspecific IVCD with LAD Inferior infarct, old No significant change was found Confirmed by Glynn Octave (  [id number]) on 07/14/2023 4:00:51 AM  Radiology CT ANGIO HEAD NECK W WO CM (CODE STROKE)  Result Date: 07/14/2023 CLINICAL DATA:  Left-sided weakness EXAM: CT ANGIOGRAPHY HEAD AND NECK WITH AND WITHOUT CONTRAST TECHNIQUE: Multidetector CT imaging of the head and neck was performed using the standard protocol  during bolus administration of intravenous contrast. Multiplanar CT image reconstructions and MIPs were obtained to evaluate the vascular anatomy. Carotid stenosis measurements (when applicable) are obtained utilizing NASCET criteria, using the distal internal carotid diameter as the denominator. RADIATION DOSE REDUCTION: This exam was performed according to the departmental dose-optimization program which includes automated exposure control, adjustment of the mA and/or kV according to patient size and/or use of iterative reconstruction technique. CONTRAST:  75mL OMNIPAQUE IOHEXOL 350 MG/ML SOLN COMPARISON:  None Available. FINDINGS: CTA NECK FINDINGS SKELETON: No acute abnormality or high grade bony spinal canal stenosis. OTHER NECK: Debris throughout the cervical esophagus UPPER CHEST: No pneumothorax or pleural effusion. No nodules or masses. AORTIC ARCH: There is calcific atherosclerosis of the aortic arch. Normal variant aortic arch branching pattern with the brachiocephalic and left common carotid arteries sharing a common origin. RIGHT CAROTID SYSTEM: No dissection, occlusion or aneurysm. Mild atherosclerotic calcification at the carotid bifurcation without hemodynamically significant stenosis. LEFT CAROTID SYSTEM: No dissection, occlusion or aneurysm. There is mixed density atherosclerosis extending into the proximal ICA, resulting in less than 50% stenosis. VERTEBRAL ARTERIES: Left dominant configuration. There is no dissection, occlusion or flow-limiting stenosis to the skull base (V1-V3 segments). CTA HEAD FINDINGS POSTERIOR CIRCULATION: Vertebral arteries are normal. No proximal occlusion of the anterior or inferior cerebellar arteries. Basilar artery is normal. Superior cerebellar arteries are normal. Posterior cerebral arteries are normal. ANTERIOR CIRCULATION: Intracranial internal carotid arteries are normal. Anterior cerebral arteries are normal. Middle cerebral arteries are normal. VENOUS SINUSES:  As permitted by contrast timing, patent. ANATOMIC VARIANTS: None Review of the MIP images confirms the above findings. IMPRESSION: 1. No emergent large vessel occlusion or hemodynamically significant stenosis of the head or neck. 2. Debris throughout the cervical esophagus, suggestive of gastroesophageal reflux. Aortic Atherosclerosis (ICD10-I70.0). Electronically Signed   By: Deatra Robinson M.D.   On: 07/14/2023 03:45   CT HEAD CODE STROKE WO CONTRAST  Result Date: 07/14/2023 CLINICAL DATA:  Code stroke.  Left-sided weakness EXAM: CT HEAD WITHOUT CONTRAST TECHNIQUE: Contiguous axial images were obtained from the base of the skull through the vertex without intravenous contrast. RADIATION DOSE REDUCTION: This exam was performed according to the departmental dose-optimization program which includes automated exposure control, adjustment of the mA and/or kV according to patient size and/or use of iterative reconstruction technique. COMPARISON:  None Available. FINDINGS: Brain: There is no mass, hemorrhage or extra-axial collection. The size and configuration of the ventricles and extra-axial CSF spaces are normal. The brain parenchyma is normal, without evidence of acute or chronic infarction. Vascular: No abnormal hyperdensity of the major intracranial arteries or dural venous sinuses. No intracranial atherosclerosis. Skull: The visualized skull base, calvarium and extracranial soft tissues are normal. Sinuses/Orbits: No fluid levels or advanced mucosal thickening of the visualized paranasal sinuses. No mastoid or middle ear effusion. The orbits are normal. ASPECTS [facility] Stroke Program Early CT Score) - Ganglionic level infarction (caudate, lentiform nuclei, internal capsule, insula, M1-M3 cortex): 7 - Supraganglionic infarction (M4-M6 cortex): 3 Total score (0-10 with 10 being normal): 10 IMPRESSION: 1. No acute intracranial abnormality. 2. ASPECTS is 10. These results were communicated to Dr. Erick Blinks at 3:35 am on 07/14/2023 by text  page via the [facility] messaging system. Electronically Signed   By: Deatra Robinson M.D.   On: 07/14/2023 03:37    Procedures .Critical Care  Performed by: Glynn Octave, MD Authorized by: Glynn Octave, MD   Critical care provider statement:    Critical care time (minutes):  35   Critical care time was exclusive of:  Separately billable procedures and treating other patients   Critical care was necessary to treat or prevent imminent or life-threatening deterioration of the following conditions:  CNS failure or compromise   Critical care was time spent personally by me on the following activities:  Development of treatment plan with patient or surrogate, discussions with consultants, evaluation of patient's response to treatment, examination of patient, ordering and review of laboratory studies, ordering and review of radiographic studies, ordering and performing treatments and interventions, pulse oximetry, re-evaluation of patient's condition, review of old charts, blood draw for specimens and obtaining history from patient or surrogate   I assumed direction of critical care for this patient from another provider in my specialty: no     Care discussed with: admitting provider       Medications Ordered in ED Medications  iohexol (OMNIPAQUE) 350 MG/ML injection 75 mL (has no administration in time range)    ED Course/ Medical Decision Making/ A&P                                 Medical Decision Making Amount and/or Complexity of Data Reviewed Independent Historian: EMS Labs: ordered. Decision-making details documented in ED Course. Radiology: ordered and independent interpretation performed. Decision-making details documented in ED Course. ECG/medicine tests: ordered and independent interpretation performed. Decision-making details documented in ED Course.   Patient seen as code stroke with left-sided weakness, left seen normal 10:30 PM.   Airway stable.  Taken emergently to CT scanner.  CT head negative for hemorrhage.  Does not show any obvious large area of ischemia.  Hypoxic to 85% on room air and quite somnolent.  Placed on nasal cannula oxygen, will obtain EKG and chest x-ray  Chart review shows patient was discharged from inpatient rehab 2 days ago after stay for cerebellar infarct.  He was found to have volume overload at that time and was given Zaroxolyn and followed by cardiology  It is possible obesity hypoventilation syndrome versus diastolic CHF.  EF is 60%.  Torsemide was held during his previous admission due to low blood pressures.  Creatinine today has worsened to 1.8.  Patient quite somnolent on arrival.  EKG does not show any acute ischemia.  Patient not TNK candidate due to Eliquis use and recent stroke. Neurology recommending MRI and EEG> do not suspect subclinical seizures.  Sedating medications such as trazodone, Abilify and Wellbutrin may be contributing.  MRI not able to be done tonight as patient has a stimulator that needs remote to shut it off.  He does have new oxygen requirement and likely obesity hypoventilation syndrome.  Chest x-ray negative for infiltrate.  Will check ABG.  ABG without significant CO2 retention.  pH is normal.  Mentation slowly improving.  He denies any pain.  Left-sided weakness persists.  Similar presentation last month when he was found to have a cerebellar stroke.  Recommending MRI and EEG and no further workup if negative.  Given his somnolence, will plan for hospitalist admission.  Discussed with Dr. Maisie Fus.        Final Clinical Impression(s) /  ED Diagnoses Final diagnoses:  None    Rx / DC Orders ED Discharge Orders     None         Dezarai Prew, Jeannett Senior, MD 07/14/23 [id number]

## 2023-07-14 NOTE — ED Notes (Signed)
Patient friend brought in remote to stimulater however didn't bring the charger, called and she stated she didn't have a ride and wasn't sure when she would be coming. MRI aware

## 2023-07-14 NOTE — Progress Notes (Signed)
Pt placed on BIPAP 12/6 on 40% per order and is tolerating well. RT will monitor.     07/14/23 0844  Vent Select  Invasive or Noninvasive Noninvasive  Adult Vent Y  Adult Ventilator Settings  Vent Type Servo i  Vent Mode Bi-Vent;PCV  Set Rate 22 bmp  FiO2 (%) 40 %  IPAP 12 cmH20  EPAP 6 cmH20  Pressure Control 6 cmH20  PEEP 6 cmH20  Adult Ventilator Measurements  Peak Airway Pressure 11 L/min  Mean Airway Pressure 6 cmH20  Resp Rate Spontaneous 5 br/min  Resp Rate Total 27 br/min  Exhaled Vt 503 mL  Measured Ve 14.1 L  I:E Ratio Measured 1:2.0  Auto PEEP 0 cmH20  Total PEEP 6 cmH20  SpO2 99 %  Adult Ventilator Alarms  Alarms On Y  Ve High Alarm 21 L/min  Ve Low Alarm 4 L/min  Resp Rate High Alarm 38 br/min  Resp Rate Low Alarm 8  PEEP Low Alarm 3 cmH2O  Press High Alarm 40 cmH2O  T Apnea 20 sec(s)  VAP Prevention  HOB> 30 Degrees Y  Breath Sounds  Bilateral Breath Sounds Diminished  Vent Respiratory Assessment  Respiratory Pattern Regular;Unlabored

## 2023-07-14 NOTE — Plan of Care (Signed)
  Problem: Education: Goal: Knowledge of disease or condition will improve Outcome: Progressing   Problem: Ischemic Stroke/TIA Tissue Perfusion: Goal: Complications of ischemic stroke/TIA will be minimized Outcome: Progressing   Problem: Coping: Goal: Will identify appropriate support needs Outcome: Progressing   Problem: Health Behavior/Discharge Planning: Goal: Goals will be collaboratively established with patient/family Outcome: Progressing

## 2023-07-14 NOTE — Progress Notes (Signed)
EEG complete - results pending 

## 2023-07-14 NOTE — H&P (Signed)
History and Physical    Patient: Lawrence [name] NWG:[id number] DOB: [birth year] DOA: 07/14/2023 DOS: the patient was seen and examined on 07/14/2023 PCP: [name], PA-C  Patient coming from: Home via EMS  Chief Complaint:  Chief Complaint  Patient presents with   Code Stroke   HPI: Lawrence [name] is a 67 y.o. male with medical history significant of hypertension, atrial fibrillation on Eliquis, CAD, diastolic congestive heart failure, s/p TAVR, recent left cerebellar stroke, diabetes mellitus type 2, seizure disorder, COPD, spinal stimulator who presented after having a fall.  History is mostly obtained from patient's wife as well as review of records due to his current condition.  He reported to have last been seen normal yesterday evening at 2230 watching television.  At around 2 am in the morning his wife had heard a noise and came to check on him. She checked the cameras and saw that he was walking back from kitchen prior to the fall.  Patient reported that he felt lightheaded and tried to sit on his rollator, but missed and landed on his knee.  Denied any loss of consciousness or trauma to his head.  She makes note that the EMTs did not get the story correctly and did not even check his vital signs.  Records note he had just recently been hospitalized from in November after being noted to have left-sided weakness and slurred speech found to have a left cerebellar infarct.  He went to rehab thereafter and was just discharged home 2 days ago.  He had been doing well at home, but still needed some assistance with getting around.  At baseline patient is not on oxygen.  He did report having some discomfort in his chest, but otherwise was unable to tell me why he was here at this time.  Patient's wife also notes that he has been compliant with CPAP, but was not wearing it last night because he had not gotten the bed yet.   Patient was seen as a code stroke upon arrival to the emergency  department and was evaluated by neurology.  CT scan of the brain did not note any acute abnormality.  Patient was not a TNK candidate due to being on Eliquis.  CT angiogram of the head and neck did not show any large vessel occlusion.  Patient was noted to be afebrile with respiration 14-24, O2 saturations as low as 85% with improvement on 3 L of nasal cannula oxygen, and all other vital signs maintained.  Labs noted WBC 10.5 without signs of left shift, BUN 29, creatinine 1.86, alcohol level undetectable, and BNP 27.5.  Chest x-ray noted low lung volumes.  Urinalysis showed no signs for infection.  UDS was negative.  Neurology recommended checking MRI and EEG given recent stroke.  Review of Systems: As mentioned in the history of present illness. All other systems reviewed and are negative. Past Medical History:  Diagnosis Date   Acquired dilation of ascending aorta and aortic root (HCC)    40mm by echo 01/2021   Adenomatous colon polyp 2007   Anemia    Anxiety    Asthma    BPH without obstruction/lower urinary tract symptoms 02/22/2017   Chronic diastolic (congestive) heart failure (HCC)    Chronic venous stasis 03/07/2019   COPD (chronic obstructive pulmonary disease) (HCC)    Coronary artery calcification seen on CAT scan    Depression    Diabetic neuropathy (HCC) 09/11/2019   History of colon polyps 08/24/2018  Hypertension    Morbid obesity (HCC)    OSA (obstructive sleep apnea)    Pain due to onychomycosis of toenails of both feet 09/11/2019   Peripheral neuropathy 02/22/2017   Primary osteoarthritis, left shoulder 03/05/2017   PTSD (post-traumatic stress disorder)    Pure hypercholesterolemia    QT prolongation 03/07/2019   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 Lynnette Caffey and Dr. Laneta Simmers   Seizures [facility])    Severe aortic stenosis    Sinus tachycardia 03/07/2019   Sleep apnea    CPAP   Type 2 diabetes mellitus  with vascular disease (HCC) 09/11/2019   Past Surgical History:  Procedure Laterality Date   ENDOVENOUS ABLATION SAPHENOUS VEIN W/ LASER Right 08/20/2020   endovenous laser ablation right greater saphenous vein by Cari Caraway MD    ENDOVENOUS ABLATION SAPHENOUS VEIN W/ LASER Left 11/16/2022   endovenous laser ablation left greater saphenous vein by Cari Caraway MD   INTRAOPERATIVE TRANSTHORACIC ECHOCARDIOGRAM N/A 02/07/2023   Procedure: INTRAOPERATIVE TRANSTHORACIC ECHOCARDIOGRAM;  Surgeon: Orbie Pyo, MD;  Location: [facility] INVASIVE CV LAB;  Service: Open Heart Surgery;  Laterality: N/A;   JOINT REPLACEMENT     left knee replacement x 2   KNEE ARTHROSCOPY Bilateral    LEFT HEART CATH AND CORONARY ANGIOGRAPHY N/A 01/17/2018   Procedure: LEFT HEART CATH AND CORONARY ANGIOGRAPHY;  Surgeon: Marykay Lex, MD;  Location: [facility] INVASIVE CV LAB;  Service: Cardiovascular;  Laterality: N/A;   PRESSURE SENSOR/CARDIOMEMS N/A 08/26/2021   Procedure: PRESSURE SENSOR/CARDIOMEMS;  Surgeon: Laurey Morale, MD;  Location: [facility] INVASIVE CV LAB;  Service: Cardiovascular;  Laterality: N/A;   RIGHT HEART CATH N/A 08/26/2021   Procedure: RIGHT HEART CATH;  Surgeon: Laurey Morale, MD;  Location: [facility] INVASIVE CV LAB;  Service: Cardiovascular;  Laterality: N/A;   RIGHT HEART CATH AND CORONARY ANGIOGRAPHY N/A 01/26/2023   Procedure: RIGHT HEART CATH AND CORONARY ANGIOGRAPHY;  Surgeon: Orbie Pyo, MD;  Location: [facility] INVASIVE CV LAB;  Service: Cardiovascular;  Laterality: N/A;   TEE WITHOUT CARDIOVERSION N/A 08/26/2021   Procedure: TRANSESOPHAGEAL ECHOCARDIOGRAM (TEE);  Surgeon: Laurey Morale, MD;  Location: [facility] ENDOSCOPY;  Service: Cardiovascular;  Laterality: N/A;   TOOTH EXTRACTION N/A 02/03/2023   Procedure: DENTAL RESTORATION/EXTRACTIONS;  Surgeon: [name], DMD;  Location: [facility] OR;  Service: Oral Surgery;  Laterality: N/A;   TRANSCATHETER AORTIC VALVE REPLACEMENT, TRANSFEMORAL Right 02/07/2023    Procedure: Transcatheter Aortic Valve Replacement, Transfemoral;  Surgeon: Orbie Pyo, MD;  Location: [facility] INVASIVE CV LAB;  Service: Open Heart Surgery;  Laterality: Right;   UMBILICAL HERNIA REPAIR     Social History:  reports that he quit smoking about 7 years ago. His smoking use included cigarettes. He started smoking about 51 years ago. He has a 44 pack-year smoking history. He has been exposed to tobacco smoke. He has never used smokeless tobacco. He reports current alcohol use. He reports that he does not use drugs.  Allergies  Allergen Reactions   Vancomycin Other (See Comments)    "Red Man Syndrome" 02/02/17: possible cause for rash under both arms   Niacin And Related Other (See Comments)    Red man syndrome   Tubersol [Tuberculin, Ppd] Other (See Comments)    Reaction unknown   Doxycycline Rash and Other (See Comments)    Family History  Problem Relation Age of Onset   CAD Maternal Grandfather    Diabetes Other  Diabetes Mellitus II Neg Hx    Colon cancer Neg Hx    Esophageal cancer Neg Hx    Inflammatory bowel disease Neg Hx    Liver disease Neg Hx    Pancreatic cancer Neg Hx    Rectal cancer Neg Hx    Stomach cancer Neg Hx    Sleep apnea Neg Hx     Prior to Admission medications   Medication Sig Start Date End Date Taking? Authorizing Provider  acetaminophen (TYLENOL) 650 MG CR tablet Take 1 tablet (650 mg total) by mouth every 8 (eight) hours as needed for pain. 07/10/23   Love, Evlyn Kanner, PA-C  albuterol (VENTOLIN HFA) 108 (90 Base) MCG/ACT inhaler Inhale 2 puffs into the lungs every 6 (six) hours as needed for wheezing or shortness of breath. 04/17/23   Hunsucker, Lesia Sago, MD  allopurinol (ZYLOPRIM) 100 MG tablet Take 1 tablet (100 mg total) by mouth daily for 6 days, THEN 1 tablet (100 mg total) 2 (two) times daily for 7 days, THEN 1.5 tablets (150 mg total) 2 (two) times daily for 7 days, THEN 2 tablets (200 mg total) 2 (two) times daily. 07/12/23 07/26/24   Jacquelynn Cree, PA-C  apixaban (ELIQUIS) 5 MG TABS tablet Take 1 tablet (5 mg total) by mouth 2 (two) times daily. 04/17/23   Laurey Morale, MD  ARIPiprazole (ABILIFY) 5 MG tablet Take 1 tablet (5 mg total) by mouth at bedtime. 06/08/23   Mozingo, Thereasa Solo, NP  busPIRone (BUSPAR) 10 MG tablet Take 1 tablet (10 mg total) by mouth 3 (three) times daily. 04/17/23   Mozingo, Thereasa Solo, NP  Cholecalciferol (VITAMIN D3 PO) Take 2,000 Units by mouth daily.    [provider]  colchicine 0.6 MG tablet Take 1 tablet (0.6 mg total) by mouth daily. 07/12/23   Love, Evlyn Kanner, PA-C  cyanocobalamin (VITAMIN B12) 1000 MCG tablet Take 1,000 mcg by mouth daily.    [provider]  fenofibrate (TRICOR) 145 MG tablet Take 1 tablet (145 mg total) by mouth daily. 04/17/23   Laurey Morale, MD  ferrous sulfate 324 (65 Fe) MG TBEC Take 324 mg by mouth daily with breakfast.     [provider]  fluticasone (FLONASE) 50 MCG/ACT nasal spray Place 2 sprays into both nostrils daily.     [provider]  Fluticasone-Umeclidin-Vilant (TRELEGY ELLIPTA) 200-62.5-25 MCG/ACT AEPB INHALE 1 PUFF BY MOUTH INTO LUNGS ONCE DAILY 08/18/22   Hunsucker, Lesia Sago, MD  icosapent Ethyl (VASCEPA) 1 g capsule Take 2 capsules (2 g total) by mouth 2 (two) times daily. 04/17/23   Laurey Morale, MD  insulin aspart (NOVOLOG FLEXPEN) 100 UNIT/ML FlexPen INJECT 8 UNITS BEFORE MEALS, keep sugar TWO HOURS AFTER meals UNDER 180 AT least 07/11/23   Love, Pamela S, PA-C  insulin degludec (TRESIBA FLEXTOUCH) 100 UNIT/ML FlexTouch Pen Inject 30 Units into the skin daily. 07/11/23   Love, Evlyn Kanner, PA-C  Insulin Pen Needle 32G X 4 MM MISC Use as directed 07/12/23   Love, Evlyn Kanner, PA-C  KLOR-CON M20 20 MEQ tablet TAKE 2 TABLETS (40 MEQ TOTAL) BY MOUTH 2 (TWO) TIMES DAILY. EXCEPT TUESDAYS WHEN YOU TAKE ( 3 TABS) 07/13/23   Laurey Morale, MD  Lacosamide 150 MG TABS Take 1 tablet (150 mg total) by mouth 2 (two)  times daily. 05/04/23   Windell Norfolk, MD  levETIRAcetam (KEPPRA) 500 MG tablet Take 1 tablet (500 mg total) by mouth 2 (two) times  daily. 05/04/23   Windell Norfolk, MD  Menthol-Methyl Salicylate (MUSCLE RUB) 10-15 % CREA Apply 1 Application topically as needed for muscle pain (to knees for pain). 07/10/23   Love, Evlyn Kanner, PA-C  methocarbamol (ROBAXIN) 500 MG tablet Take 1 tablet (500 mg total) by mouth every 8 (eight) hours as needed for muscle spasms. 07/11/23   Love, Evlyn Kanner, PA-C  metoprolol succinate (TOPROL-XL) 25 MG 24 hr tablet Take 1 tablet (25 mg total) by mouth daily. 07/12/23   Love, Evlyn Kanner, PA-C  omeprazole (PRILOSEC) 20 MG capsule Take 20 mg by mouth every morning. 05/29/20   [provider]  polyethylene glycol powder (GLYCOLAX/MIRALAX) 17 GM/SCOOP powder Mix in water as directed and take 1 capful (17 g) by mouth daily. 07/12/23   Love, Evlyn Kanner, PA-C  pregabalin (LYRICA) 150 MG capsule Take 1 capsule (150 mg total) by mouth 3 (three) times daily. 05/04/23   Windell Norfolk, MD  rosuvastatin (CRESTOR) 40 MG tablet Take 1 tablet (40 mg total) by mouth daily. 07/12/23   Love, Evlyn Kanner, PA-C  senna-docusate (SENOKOT-S) 8.6-50 MG tablet Take 1 tablet by mouth at bedtime. 07/11/23   Love, Evlyn Kanner, PA-C  tirzepatide [facility]) 15 MG/0.5ML Pen INJECT 15 MG INTO THE SKIN ONCE A WEEK. Patient taking differently: 15 mg once a week. Inject on Monday 06/07/23   Altamese Alpine, MD  torsemide (DEMADEX) 20 MG tablet Take 6 tablets (120 mg total) by mouth 2 (two) times daily. 07/12/23   Love, Evlyn Kanner, PA-C  traZODone (DESYREL) 100 MG tablet Take 0.5 tablets (50 mg total) by mouth at bedtime. 07/10/23   Jacquelynn Cree, PA-C  venlafaxine XR (EFFEXOR-XR) 75 MG 24 hr capsule Take 3 capsules (225 mg total) by mouth daily with breakfast. 04/17/23   Mozingo, Thereasa Solo, NP    Physical Exam: Vitals:   07/14/23 0545 07/14/23 0630 07/14/23 0645 07/14/23 0801  BP: 116/84 106/71 118/76   Pulse: 89 89  89   Resp: 14 15 17    Temp:    98 F (36.7 C)  TempSrc:    Oral  SpO2: 96% 93% 93%   Weight:      Height:         Constitutional: Elderly obese male who appears to be in no acute distress Eyes: PERRL, lids and conjunctivae normal ENMT: Mucous membranes are moist.  Fair dentition Neck: normal, supple, no JVD appreciated at this time Respiratory: Decreased overall aeration without significant wheezes appreciated at this time. Cardiovascular: Regular rate and rhythm, no murmurs / rubs / gallops.  Trace lower extremity edema. 2+ pedal pulses.   Abdomen: Protuberant no tenderness, no masses palpated.   Bowel sounds positive.  Musculoskeletal: no clubbing / cyanosis. No joint deformity upper and lower extremities. Good ROM, no contractures. Normal muscle tone.  Skin: no rashes, lesions, ulcers. No induration Neurologic: CN 2-12 grossly intact.  Left Sonnett weakness appreciated. Psychiatric: Lethargic.  Oriented to place and time, but not circumstance  Data Reviewed:  Reviewed labs, imaging, and pertinent records as documented.  Assessment and Plan:  Left-sided weakness Acute encephalopathy Patient presented with somnolence and reports of increased left-sided weakness after falling at home.  No reported seizure activity.  Initial CT scan of the head did not note any acute abnormalities.  He was not a thrombolytic candidate due to being on anticoagulation.  CTA of the head and neck did not reveal any large vessel occlusion.  Neurology had been consulted and recommended checking MRI of  the brain and EEG.  Patient had similar presentation when seen in the hospital last month where he was found to have a stroke.  Unclear if symptoms are related to hypercapnia. -Admit to a progressive bed -Delirium precautions -Neurochecks -Follow-up MRI of the brain -Follow-up EEG  Acute respiratory failure with hypoxia and hypercapnia Patient is not on oxygen at baseline, but is supposed to be on CPAP  at night.  Chest x-ray had noted low lung volumes without acute abnormality.  ABG noted pH 7.348 with pCO2 52.2, and pO2 64.  Patient was placed on BiPAP. -Continuous pulse oximetry with oxygen to maintain O2 saturation greater than 90% -BiPAP -Recheck VBG -Breathing treatments as needed  Paroxysmal atrial fibrillation on chronic anticoagulation Patient appears to be in a sinus rhythm at this time.  CHA2DS2-VASc score appears to be greater than 5. -Continue Eliquis and beta-blocker  Acute kidney injury superimposed on chronic kidney disease stage IIIa Creatinine noted to be 1.86 with BUN 29.  Baseline creatinine previously noted to be within normal limits at 1.1-1.4.  During prior hospitalization patient was noted to have acute kidney injury related to dehydration.  Urinalysis did not show any significant signs for infection.  He reported feeling lightheaded prior to falling is on high dose of diuretics -Strict I&Os -Hold possible nephrotoxic agents -Normal saline IV fluids at 75 mL/h.   Fall at home Patient presented after having a fall at home while coming from the kitchen from what his wife could see on monitors in the home.   He needs assistance with ambulation and has been utilizing a walker. -Fall precautions -PT/OT to evaluate and treat  History of CVA with residual deficit Patient had just recently been hospitalized in November found to have a 5 mm acute ischemic nonhemorrhagic left cerebellar infarct.  Infarct did not correlate with the left lower extremity weakness patient was noted to have and thought likely due to prior stroke. -PT/OT to evaluate and treat   Controlled diabetes mellitus type 2, with long-term use of insulin Last hemoglobin A1c noted to be 6.5 when checked on 11/18.   Home regimen includes Tresiba 30 units daily, NovoLog 8 units before meals, and Mounjaro -Hypoglycemic protocols -Continue pharmacy substitution of Semglee -CBGs before every meal with sensitive  SSI -adjust regimen as needed  Essential hypertension -Continue metoprolol with no signs of stroke on MRI  COPD On physical exam patient without significant wheezing appreciated at this time. -Continue home inhaler  -DuoNebs as needed for shortness of breath or wheezing  Seizure disorder No seizure activity was reported. -Seizure precautions -Continue Keppra and lacosamide  Chronic diastolic CHF On physical exam patient has some lower extremity edema, but does not appear grossly fluid overloaded on.  BNP was reassuring at 27.4.  Last echocardiogram noted EF to be 55 to 60% with grade 1 diastolic dysfunction back on 03/10/2023. -Strict intake and output -Daily weights -Held torsemide due to AKI.  Monitor closely and determine when to resume diuretics at adjusted dose  Chronic bilateral lower extremity stasis ulcers -Continue wound care anxiety  Anxiety  OSA on CPAP Patient was not wearing CPAP last night as he had not gone to bed yet when he had his fall. -Continue CPAP at night  Morbid obesity BMI 44.94 kg/m  DVT prophylaxis: Eliquis Advance Care Planning:   Code Status: Limited: Do not attempt resuscitation (DNR) -DNR-LIMITED -Do Not Intubate/DNI    Consults: None  Family Communication: Family updated at bedside  Severity of Illness: The  appropriate patient status for this patient is OBSERVATION. Observation status is judged to be reasonable and necessary in order to provide the required intensity of service to ensure the patient's safety. The patient's presenting symptoms, physical exam findings, and initial radiographic and laboratory data in the context of their medical condition is felt to place them at decreased risk for further clinical deterioration. Furthermore, it is anticipated that the patient will be medically stable for discharge from the hospital within 2 midnights of admission.   Author: Clydie Braun, MD 07/14/2023 8:14 AM  For on call review  [email or web address].

## 2023-07-14 NOTE — Procedures (Signed)
Patient Name: Lawrence [name]  MRN: [id number]  Epilepsy Attending: Charlsie Quest  Referring Physician/Provider: Erick Blinks, MD  Date: 07/14/2023 Duration: 26.22 mins  Patient history: 67yo M with ams getting eeg to evaluate for seizure  Level of alertness: Awake, asleep  AEDs during EEG study: LEV  Technical aspects: This EEG study was done with scalp electrodes positioned according to the 10-20 International system of electrode placement. Electrical activity was reviewed with band pass filter of 1-70Hz , sensitivity of 7 uV/mm, display speed of 62mm/sec with a 60Hz  notched filter applied as appropriate. EEG data were recorded continuously and digitally stored.  Video monitoring was available and reviewed as appropriate.  Description: The posterior dominant rhythm consists of 7.5 Hz activity of moderate voltage (25-35 uV) seen predominantly in posterior head regions, symmetric and reactive to eye opening and eye closing. Sleep was characterized by vertex waves, sleep spindles (12 to 14 Hz), maximal frontocentral region. Physiologic photic driving was not seen during photic stimulation. Hyperventilation was not performed.     IMPRESSION: This study is within normal limits. No seizures or definite epileptiform discharges were seen throughout the recording.  A normal interictal EEG does not exclude the diagnosis of epilepsy.   Lawrence [name]

## 2023-07-14 NOTE — ED Triage Notes (Signed)
Arrives [facility] as a code stroke. Notification made to 911 after patient has slid from his lift assist recliner where he sleeps.   Reported to have substantial left sided weakness compared to chronic left sided weakness.   Recent small vessel CVA. Compliant on Eliquis for afib.   Last known normal - 2230 07/13/23

## 2023-07-14 NOTE — ED Notes (Signed)
Spoke with significant other. She plans to bring "stimulator" remote soon as she is able to find a ride so pt can go to MRI.

## 2023-07-14 NOTE — Code Documentation (Signed)
Responded to Code Stroke called at 0251 for L sided weakness, [id number]. Pt arrived at 0305, CBG-122, NIH-6, CT head negative for acute changes, CTA-no LVO. TNK not given-pt on eliquis. Plan stroke workup. Please complete VS/neuro checks q2h x 12, then q4h.

## 2023-07-14 NOTE — Progress Notes (Signed)
   07/14/23 2129  Spiritual Encounters  Type of Visit Initial  Care provided to: Patient  Conversation partners present during encounter Nurse  Referral source Patient request  Reason for visit Routine spiritual support  OnCall Visit Yes  Spiritual Framework  Presenting Themes Impactful experiences and emotions;Values and beliefs  Community/Connection Family  Patient Stress Factors Health changes;Loss of control  Interventions  Spiritual Care Interventions Made Established relationship of care and support;Compassionate presence;Reflective listening;Normalization of emotions;Prayer;Encouragement  Intervention Outcomes  Outcomes Reduced anxiety;Connection to spiritual care;Awareness of support;Autonomy/agency  Spiritual Care Plan  Spiritual Care Issues Still Outstanding No further spiritual care needs at this time (see row info)   Patient requested to see a chaplain. Patient requested the chaplain to assist him in getting some food. Chaplain listened to the patient's concerns and provided a compassionate presence. Patient prayed with the chaplain. No further needs at this time.   Arlyce Dice, Chaplain Resident [phone]

## 2023-07-15 DIAGNOSIS — W19XXXA Unspecified fall, initial encounter: Secondary | ICD-10-CM | POA: Diagnosis present

## 2023-07-15 DIAGNOSIS — G9341 Metabolic encephalopathy: Secondary | ICD-10-CM | POA: Diagnosis present

## 2023-07-15 DIAGNOSIS — Z66 Do not resuscitate: Secondary | ICD-10-CM | POA: Diagnosis present

## 2023-07-15 DIAGNOSIS — Z6841 Body Mass Index (BMI) 40.0 and over, adult: Secondary | ICD-10-CM | POA: Diagnosis not present

## 2023-07-15 DIAGNOSIS — R531 Weakness: Secondary | ICD-10-CM | POA: Diagnosis not present

## 2023-07-15 DIAGNOSIS — I130 Hypertensive heart and chronic kidney disease with heart failure and stage 1 through stage 4 chronic kidney disease, or unspecified chronic kidney disease: Secondary | ICD-10-CM | POA: Diagnosis present

## 2023-07-15 DIAGNOSIS — I5032 Chronic diastolic (congestive) heart failure: Secondary | ICD-10-CM | POA: Diagnosis present

## 2023-07-15 DIAGNOSIS — J9601 Acute respiratory failure with hypoxia: Secondary | ICD-10-CM | POA: Diagnosis present

## 2023-07-15 DIAGNOSIS — G40909 Epilepsy, unspecified, not intractable, without status epilepticus: Secondary | ICD-10-CM | POA: Diagnosis present

## 2023-07-15 DIAGNOSIS — I83019 Varicose veins of right lower extremity with ulcer of unspecified site: Secondary | ICD-10-CM | POA: Diagnosis present

## 2023-07-15 DIAGNOSIS — E7800 Pure hypercholesterolemia, unspecified: Secondary | ICD-10-CM | POA: Diagnosis present

## 2023-07-15 DIAGNOSIS — I69354 Hemiplegia and hemiparesis following cerebral infarction affecting left non-dominant side: Secondary | ICD-10-CM | POA: Diagnosis not present

## 2023-07-15 DIAGNOSIS — Z953 Presence of xenogenic heart valve: Secondary | ICD-10-CM | POA: Diagnosis not present

## 2023-07-15 DIAGNOSIS — Y92009 Unspecified place in unspecified non-institutional (private) residence as the place of occurrence of the external cause: Secondary | ICD-10-CM | POA: Diagnosis not present

## 2023-07-15 DIAGNOSIS — E1140 Type 2 diabetes mellitus with diabetic neuropathy, unspecified: Secondary | ICD-10-CM | POA: Diagnosis present

## 2023-07-15 DIAGNOSIS — I83029 Varicose veins of left lower extremity with ulcer of unspecified site: Secondary | ICD-10-CM | POA: Diagnosis present

## 2023-07-15 DIAGNOSIS — I480 Paroxysmal atrial fibrillation: Secondary | ICD-10-CM | POA: Diagnosis present

## 2023-07-15 DIAGNOSIS — R400 Somnolence: Secondary | ICD-10-CM | POA: Diagnosis present

## 2023-07-15 DIAGNOSIS — N400 Enlarged prostate without lower urinary tract symptoms: Secondary | ICD-10-CM | POA: Diagnosis present

## 2023-07-15 DIAGNOSIS — I951 Orthostatic hypotension: Secondary | ICD-10-CM | POA: Diagnosis present

## 2023-07-15 DIAGNOSIS — Z87891 Personal history of nicotine dependence: Secondary | ICD-10-CM | POA: Diagnosis not present

## 2023-07-15 DIAGNOSIS — N1831 Chronic kidney disease, stage 3a: Secondary | ICD-10-CM | POA: Diagnosis present

## 2023-07-15 DIAGNOSIS — J4489 Other specified chronic obstructive pulmonary disease: Secondary | ICD-10-CM | POA: Diagnosis present

## 2023-07-15 DIAGNOSIS — Z794 Long term (current) use of insulin: Secondary | ICD-10-CM | POA: Diagnosis not present

## 2023-07-15 DIAGNOSIS — E1122 Type 2 diabetes mellitus with diabetic chronic kidney disease: Secondary | ICD-10-CM | POA: Diagnosis present

## 2023-07-15 DIAGNOSIS — J9602 Acute respiratory failure with hypercapnia: Secondary | ICD-10-CM | POA: Diagnosis present

## 2023-07-15 DIAGNOSIS — N179 Acute kidney failure, unspecified: Secondary | ICD-10-CM | POA: Diagnosis present

## 2023-07-15 LAB — BRAIN NATRIURETIC PEPTIDE: B Natriuretic Peptide: 78.1 pg/mL (ref 0.0–100.0)

## 2023-07-15 LAB — BASIC METABOLIC PANEL
Anion gap: 7 (ref 5–15)
BUN: 20 mg/dL (ref 8–23)
CO2: 27 mmol/L (ref 22–32)
Calcium: 8.7 mg/dL — ABNORMAL LOW (ref 8.9–10.3)
Chloride: 105 mmol/L (ref 98–111)
Creatinine, Ser: 1.07 mg/dL (ref 0.61–1.24)
GFR, Estimated: >60 mL/min (ref >60)
Glucose, Bld: 141 mg/dL — ABNORMAL HIGH (ref 70–99)
Potassium: 3.6 mmol/L (ref 3.5–5.1)
Sodium: 139 mmol/L (ref 135–145)

## 2023-07-15 LAB — GLUCOSE, CAPILLARY
Glucose-Capillary: 155 mg/dL — ABNORMAL HIGH (ref 70–99)
Glucose-Capillary: 162 mg/dL — ABNORMAL HIGH (ref 70–99)
Glucose-Capillary: 231 mg/dL — ABNORMAL HIGH (ref 70–99)
Glucose-Capillary: 138 mg/dL — ABNORMAL HIGH (ref 70–99)
Glucose-Capillary: 129 mg/dL — ABNORMAL HIGH (ref 70–99)

## 2023-07-15 LAB — CBC
HCT: 42.3 % (ref 39.0–52.0)
Hemoglobin: 13.6 g/dL (ref 13.0–17.0)
MCH: 29.1 pg (ref 26.0–34.0)
MCHC: 32.2 g/dL (ref 30.0–36.0)
MCV: 90.4 fL (ref 80.0–100.0)
Platelets: 243 10*3/uL (ref 150–400)
RBC: 4.68 MIL/uL (ref 4.22–5.81)
RDW: 15.2 % (ref 11.5–15.5)
WBC: 9 10*3/uL (ref 4.0–10.5)
nRBC: 0 % (ref 0.0–0.2)

## 2023-07-15 MED ORDER — COLCHICINE 0.6 MG PO TABS
0.6000 mg | ORAL_TABLET | Freq: Every day | ORAL | Status: DC
  Administered 2023-07-15 – 2023-07-17 (×3): 0.6 mg via ORAL
  Filled 2023-07-15 (×7): qty 1

## 2023-07-15 MED ORDER — ORAL CARE MOUTH RINSE: 15.0000 mL | OROMUCOSAL | Status: DC | PRN

## 2023-07-15 MED ORDER — SODIUM CHLORIDE 0.9 % IV BOLUS
250.0000 mL | Freq: Once | INTRAVENOUS | Status: AC
  Administered 2023-07-15: 250 mL via INTRAVENOUS

## 2023-07-15 NOTE — Progress Notes (Addendum)
PROGRESS NOTE    Lawrence [name]  PIR:[id number] DOB: 1956/04/25 DOA: 07/14/2023 PCP: [name], PA-C    Brief Narrative:  Lawrence [name] is a 67 y.o. male with medical history significant of hypertension, atrial fibrillation on Eliquis, CAD, diastolic congestive heart failure, s/p TAVR, recent left cerebellar stroke, diabetes mellitus type 2, seizure disorder, COPD, spinal stimulator who presented after having a fall.  History is mostly obtained from patient's wife as well as review of records due to his current condition.  He reported to have last been seen normal yesterday evening at 2230 watching television.  At around 2 am in the morning his wife had heard a noise and came to check on him. She checked the cameras and saw that he was walking back from kitchen prior to the fall.  Patient reported that he felt lightheaded and tried to sit on his rollator, but missed and landed on his knee.  Denied any loss of consciousness or trauma to his head.  She makes note that the EMTs did not get the story correctly and did not even check his vital signs.  Records note he had just recently been hospitalized from in November after being noted to have left-sided weakness and slurred speech found to have a left cerebellar infarct.  He went to rehab thereafter and was just discharged home 2 days ago.  He had been doing well at home, but still needed some assistance with getting around.  At baseline patient is not on oxygen.  He did report having some discomfort in his chest, but otherwise was unable to tell me why he was here at this time.  Patient's wife also notes that he has been compliant with CPAP, but was not wearing it last night because he had not gotten the bed yet.    Assessment and Plan: Left-sided weakness Acute encephalopathy -MRI/EEG negative -PT eval  Addendum -+ orthostatics per PT -IVF and recheck in AM with TED hose  Acute respiratory failure with hypoxia and  hypercapnia Patient is not on oxygen at baseline, but is supposed to be on CPAP at night. -appears improved   Paroxysmal atrial fibrillation on chronic anticoagulation Patient appears to be in a sinus rhythm at this time.  CHA2DS2-VASc score appears to be greater than 5. -Continue Eliquis and beta-blocker   Acute kidney injury superimposed on chronic kidney disease stage IIIa Creatinine noted to be 1.86 with BUN 29.  Baseline creatinine previously noted to be within normal limits at 1.1-1.4.  During prior hospitalization patient was noted to have acute kidney injury related to dehydration.  Urinalysis did not show any significant signs for infection.  He reported feeling lightheaded prior to falling is on high dose of diuretics -Hold possible nephrotoxic agents    Fall at home Patient presented after having a fall at home while coming from the kitchen from what his wife could see on monitors in the home.   He needs assistance with ambulation and has been utilizing a walker. -Fall precautions -PT/OT to evaluate and treat   History of CVA with residual deficit Patient had just recently been hospitalized in November found to have a 5 mm acute ischemic nonhemorrhagic left cerebellar infarct.  Infarct did not correlate with the left lower extremity weakness patient was noted to have and thought likely due to prior stroke. -PT/OT to evaluate and treat     Controlled diabetes mellitus type 2, with long-term use of insulin -SSI   Essential hypertension -Continue metoprolol with  no signs of stroke on MRI   COPD On physical exam patient without significant wheezing appreciated at this time. -Continue home inhaler  -DuoNebs as needed for shortness of breath or wheezing   Seizure disorder No seizure activity was reported. -Seizure precautions -Continue Keppra and lacosamide   Chronic diastolic CHF On physical exam patient has some lower extremity edema, but does not appear grossly fluid  overloaded on.  BNP was reassuring at 27.4.  Last echocardiogram noted EF to be 55 to 60% with grade 1 diastolic dysfunction back on 03/10/2023. -Strict intake and output -Daily weights -Held torsemide due to AKI.  Monitor closely and determine when to resume diuretics at adjusted dose   Chronic bilateral lower extremity stasis ulcers -Continue wound care anxiety   Anxiety   OSA on CPAP Patient was not wearing CPAP last night as he had not gone to bed yet when he had his fall. -Continue CPAP at night   Morbid obesity Estimated body mass index is 44.94 kg/m as calculated from the following:   Height as of this encounter: 6\' 2"  (1.88 m).   Weight as of this encounter: 158.8 kg.   DVT prophylaxis:  apixaban (ELIQUIS) tablet 5 mg    Code Status: Do not attempt resuscitation (DNR) PRE-ARREST INTERVENTIONS DESIRED Family Communication: called Lawrence [name]  Disposition Plan:  Level of care: Med-Surg Status is: Observation The patient will require care spanning > 2 midnights and should be moved to inpatient because: PT eval    Consultants:  Neuro (signed off)   Subjective: No SOB, no CP  Objective: Vitals:   07/14/23 2009 07/14/23 2358 07/15/23 0333 07/15/23 0854  BP: (!) 128/93 120/78 124/76 (!) 134/58  Pulse: 81 83 72 83  Resp: 18 19 18 20   Temp:  97.8 F (36.6 C) (!) 97.5 F (36.4 C) 97.7 F (36.5 C)  TempSrc:  Oral Oral Oral  SpO2: 98% 98% 97% 92%  Weight:      Height:        Intake/Output Summary (Last 24 hours) at 07/15/2023 1055 Last data filed at 07/15/2023 0645 Gross per 24 hour  Intake 1472.11 ml  Output 1100 ml  Net 372.11 ml   Filed Weights   07/14/23 0353  Weight: (!) 158.8 kg    Examination:   General: Appearance:    Severely obese male in no acute distress     Lungs:     respirations unlabored  Heart:    Normal heart rate.    MS:   All extremities are intact.    Neurologic:   Awake, alert       Data Reviewed: I have personally reviewed  following labs and imaging studies  CBC: Recent Labs  Lab 07/08/23 2319 07/11/23 0453 07/14/23 0314 07/14/23 0316 07/14/23 0601 07/15/23 0434  WBC 9.6 10.2 10.5  --   --  9.0  NEUTROABS  --   --  6.7  --   --   --   HGB 14.1 13.9 14.7 16.0 15.0 13.6  HCT 43.3 43.5 46.6 47.0 44.0 42.3  MCV 89.6 89.9 90.8  --   --  90.4  PLT 247 246 240  --   --  243   Basic Metabolic Panel: Recent Labs  Lab 07/08/23 2319 07/09/23 0530 07/11/23 0453 07/14/23 0314 07/14/23 0316 07/14/23 0601 07/15/23 0434  NA 138 138 140 140 142 142 139  K 3.8 3.6 3.7 4.0 4.1 3.5 3.6  CL 101 105 104 102 105  --  105  CO2 28 26 27 24   --   --  27  GLUCOSE 104* 135* 109* 122* 119*  --  141*  BUN 21 19 18  29* 35*  --  20  CREATININE 1.21 1.17 1.10 1.86* 2.00*  --  1.07  CALCIUM 8.9 8.9 8.9 9.6  --   --  8.7*   GFR: Estimated Creatinine Clearance: 108.3 mL/min (by C-G formula based on SCr of 1.07 mg/dL). Liver Function Tests: Recent Labs  Lab 07/14/23 0314  AST 21  ALT 24  ALKPHOS 64  BILITOT 0.6  PROT 7.3  ALBUMIN 4.0   No results for input(s): "LIPASE", "AMYLASE" in the last 168 hours. Recent Labs  Lab 07/14/23 0628  AMMONIA 31   Coagulation Profile: Recent Labs  Lab 07/14/23 0314  INR 1.1   Cardiac Enzymes: No results for input(s): "CKTOTAL", "CKMB", "CKMBINDEX", "TROPONINI" in the last 168 hours. BNP (last 3 results) No results for input(s): "PROBNP" in the last 8760 hours. HbA1C: No results for input(s): "HGBA1C" in the last 72 hours. CBG: Recent Labs  Lab 07/14/23 1650 07/14/23 1807 07/14/23 2105 07/15/23 0617 07/15/23 0849  GLUCAP 111* 127* 108* 155* 162*   Lipid Profile: No results for input(s): "CHOL", "HDL", "LDLCALC", "TRIG", "CHOLHDL", "LDLDIRECT" in the last 72 hours. Thyroid Function Tests: Recent Labs    07/14/23 0628  TSH 1.916   Anemia Panel: No results for input(s): "VITAMINB12", "FOLATE", "FERRITIN", "TIBC", "IRON", "RETICCTPCT" in the last 72  hours. Sepsis Labs: No results for input(s): "PROCALCITON", "LATICACIDVEN" in the last 168 hours.  No results found for this or any previous visit (from the past 240 hour(s)).       Radiology Studies: MR BRAIN WO CONTRAST  Result Date: 07/14/2023 CLINICAL DATA:  Acute neurologic deficit.  Left-sided weakness. EXAM: MRI HEAD WITHOUT CONTRAST TECHNIQUE: Multiplanar, multiecho pulse sequences of the brain and surrounding structures were obtained without intravenous contrast. COMPARISON:  06/25/2023 FINDINGS: Brain: No acute infarct, mass effect or extra-axial collection. No acute or chronic hemorrhage. Minimal multifocal hyperintense T2-weight signal within the white matter. The midline structures are normal. Vascular: Normal flow voids. Skull and upper cervical spine: Normal calvarium and skull base. Visualized upper cervical spine and soft tissues are normal. Sinuses/Orbits:No paranasal sinus fluid levels or advanced mucosal thickening. No mastoid or middle ear effusion. Normal orbits. IMPRESSION: No acute intracranial abnormality. Minimal chronic small vessel disease. Electronically Signed   By: Deatra Robinson M.D.   On: 07/14/2023 22:38   EEG adult  Result Date: 07/14/2023 Charlsie Quest, MD     07/14/2023  4:55 PM Patient Name: Lawrence [name] MRN: [id number] Epilepsy Attending: Charlsie Quest Referring Physician/Provider: Erick Blinks, MD Date: 07/14/2023 Duration: 26.22 mins Patient history: 67yo M with ams getting eeg to evaluate for seizure Level of alertness: Awake, asleep AEDs during EEG study: LEV Technical aspects: This EEG study was done with scalp electrodes positioned according to the 10-20 International system of electrode placement. Electrical activity was reviewed with band pass filter of 1-70Hz , sensitivity of 7 uV/mm, display speed of 71mm/sec with a 60Hz  notched filter applied as appropriate. EEG data were recorded continuously and digitally stored.  Video monitoring was  available and reviewed as appropriate. Description: The posterior dominant rhythm consists of 7.5 Hz activity of moderate voltage (25-35 uV) seen predominantly in posterior head regions, symmetric and reactive to eye opening and eye closing. Sleep was characterized by vertex waves, sleep spindles (12 to 14 Hz), maximal frontocentral region. Physiologic photic  driving was not seen during photic stimulation. Hyperventilation was not performed.   IMPRESSION: This study is within normal limits. No seizures or definite epileptiform discharges were seen throughout the recording. A normal interictal EEG does not exclude the diagnosis of epilepsy. Charlsie Quest   DG Chest Portable 1 View  Result Date: 07/14/2023 CLINICAL DATA:  67 year old male with shortness of breath. Code stroke, left side weakness. EXAM: PORTABLE CHEST 1 VIEW COMPARISON:  Portable chest 07/02/2023. CTA head and neck 0331 hours today. FINDINGS: Portable AP semi upright view at 0418 hours. Low lung volumes. Stable cardiac and mediastinal contours, mediastinal lipomatosis partially visible on CTA today. Visualized tracheal air column is within normal limits. Crowding of lung markings. No pneumothorax, pulmonary edema, pleural effusion or consolidation. IMPRESSION: Low lung volumes.  No other acute cardiopulmonary abnormality. Electronically Signed   By: Odessa Fleming M.D.   On: 07/14/2023 04:50   CT ANGIO HEAD NECK W WO CM (CODE STROKE)  Result Date: 07/14/2023 CLINICAL DATA:  Left-sided weakness EXAM: CT ANGIOGRAPHY HEAD AND NECK WITH AND WITHOUT CONTRAST TECHNIQUE: Multidetector CT imaging of the head and neck was performed using the standard protocol during bolus administration of intravenous contrast. Multiplanar CT image reconstructions and MIPs were obtained to evaluate the vascular anatomy. Carotid stenosis measurements (when applicable) are obtained utilizing NASCET criteria, using the distal internal carotid diameter as the denominator.  RADIATION DOSE REDUCTION: This exam was performed according to the departmental dose-optimization program which includes automated exposure control, adjustment of the mA and/or kV according to patient size and/or use of iterative reconstruction technique. CONTRAST:  75mL OMNIPAQUE IOHEXOL 350 MG/ML SOLN COMPARISON:  None Available. FINDINGS: CTA NECK FINDINGS SKELETON: No acute abnormality or high grade bony spinal canal stenosis. OTHER NECK: Debris throughout the cervical esophagus UPPER CHEST: No pneumothorax or pleural effusion. No nodules or masses. AORTIC ARCH: There is calcific atherosclerosis of the aortic arch. Normal variant aortic arch branching pattern with the brachiocephalic and left common carotid arteries sharing a common origin. RIGHT CAROTID SYSTEM: No dissection, occlusion or aneurysm. Mild atherosclerotic calcification at the carotid bifurcation without hemodynamically significant stenosis. LEFT CAROTID SYSTEM: No dissection, occlusion or aneurysm. There is mixed density atherosclerosis extending into the proximal ICA, resulting in less than 50% stenosis. VERTEBRAL ARTERIES: Left dominant configuration. There is no dissection, occlusion or flow-limiting stenosis to the skull base (V1-V3 segments). CTA HEAD FINDINGS POSTERIOR CIRCULATION: Vertebral arteries are normal. No proximal occlusion of the anterior or inferior cerebellar arteries. Basilar artery is normal. Superior cerebellar arteries are normal. Posterior cerebral arteries are normal. ANTERIOR CIRCULATION: Intracranial internal carotid arteries are normal. Anterior cerebral arteries are normal. Middle cerebral arteries are normal. VENOUS SINUSES: As permitted by contrast timing, patent. ANATOMIC VARIANTS: None Review of the MIP images confirms the above findings. IMPRESSION: 1. No emergent large vessel occlusion or hemodynamically significant stenosis of the head or neck. 2. Debris throughout the cervical esophagus, suggestive of  gastroesophageal reflux. Aortic Atherosclerosis (ICD10-I70.0). Electronically Signed   By: Deatra Robinson M.D.   On: 07/14/2023 03:45   CT HEAD CODE STROKE WO CONTRAST  Result Date: 07/14/2023 CLINICAL DATA:  Code stroke.  Left-sided weakness EXAM: CT HEAD WITHOUT CONTRAST TECHNIQUE: Contiguous axial images were obtained from the base of the skull through the vertex without intravenous contrast. RADIATION DOSE REDUCTION: This exam was performed according to the departmental dose-optimization program which includes automated exposure control, adjustment of the mA and/or kV according to patient size and/or use of iterative reconstruction technique.  COMPARISON:  None Available. FINDINGS: Brain: There is no mass, hemorrhage or extra-axial collection. The size and configuration of the ventricles and extra-axial CSF spaces are normal. The brain parenchyma is normal, without evidence of acute or chronic infarction. Vascular: No abnormal hyperdensity of the major intracranial arteries or dural venous sinuses. No intracranial atherosclerosis. Skull: The visualized skull base, calvarium and extracranial soft tissues are normal. Sinuses/Orbits: No fluid levels or advanced mucosal thickening of the visualized paranasal sinuses. No mastoid or middle ear effusion. The orbits are normal. ASPECTS [facility] Stroke Program Early CT Score) - Ganglionic level infarction (caudate, lentiform nuclei, internal capsule, insula, M1-M3 cortex): 7 - Supraganglionic infarction (M4-M6 cortex): 3 Total score (0-10 with 10 being normal): 10 IMPRESSION: 1. No acute intracranial abnormality. 2. ASPECTS is 10. These results were communicated to Dr. Erick Blinks at 3:35 am on 07/14/2023 by text page via the [facility] messaging system. Electronically Signed   By: Deatra Robinson M.D.   On: 07/14/2023 03:37        Scheduled Meds:  allopurinol  100 mg Oral Daily   apixaban  5 mg Oral BID   ARIPiprazole  5 mg Oral QPM   busPIRone  10 mg Oral TID    cyanocobalamin  1,000 mcg Oral Daily   fenofibrate  160 mg Oral Daily   fluticasone  2 spray Each Nare Daily   fluticasone furoate-vilanterol  1 puff Inhalation Daily   And   umeclidinium bromide  1 puff Inhalation Daily   icosapent Ethyl  2 g Oral BID   insulin aspart  0-9 Units Subcutaneous TID WC   insulin glargine-yfgn  30 Units Subcutaneous Daily   lacosamide  150 mg Oral BID   levETIRAcetam  500 mg Oral BID   metoprolol succinate  25 mg Oral Daily   pantoprazole  40 mg Oral Daily   polyethylene glycol  17 g Oral Daily   pregabalin  150 mg Oral TID   rosuvastatin  40 mg Oral Daily   senna-docusate  1 tablet Oral QHS   sodium chloride flush  3 mL Intravenous Q12H   traZODone  50 mg Oral QHS   venlafaxine XR  225 mg Oral Q breakfast   Continuous Infusions:   LOS: 0 days    Time spent: 45 minutes spent on chart review, discussion with nursing staff, consultants, updating family and interview/physical exam; more than 50% of that time was spent in counseling and/or coordination of care.    Joseph Art, DO [facility] Available via Epic secure chat 7am-7pm After these hours, please refer to coverage provider listed on [email or web address] 07/15/2023, 10:55 AM

## 2023-07-15 NOTE — Progress Notes (Signed)
[id number]  Patient also requested to see the chaplain and the [facility]. Both were informed.

## 2023-07-15 NOTE — Progress Notes (Signed)
2000H Patient was upset because he hasn't eaten anything the whole day according to him. I explained to him that there is an order for him no food and drinks, only sips of water with his medications. Then his wife called in the nurse's station regarding patient's concern and also about the MRI not done today. According to her they asked her to bring the remote for the stimulator, which she did and informed her that they will do it at 1:30PM. She demanded to call the doctor and inform the doctor to call her why MRI was not done. She said she will report about it to the management tomorrow. I messaged Dr. Janalyn Shy and informed her about the patient and wife concerns. I did a swallow screen which the patient passed. She ordered the diet and I inform the patient that the doctor will see him.  21:06H Dr. Janalyn Shy updated me that she already called his wife and explained to her. Doctor asked me to call MRI and ask what's the patient's possible schedule.  2130H I called MRI and explained that patient has already the stimulator charged  since this afternoon. They said they are not aware that it has been  brought already and charged. They asked if patient ready for the MRI. I asked the patient and said he is ready for the procedure.  [id number] Transport came and IV lorazepam given prior sending the patient.  [id number] Patient send to radiology for MRI  2240 Patient back to the unit.

## 2023-07-15 NOTE — Progress Notes (Signed)
2130H Asked patient if he wants me to call the RT to put on his Cpap, Patient refused.

## 2023-07-15 NOTE — Plan of Care (Signed)
  Problem: Education: Goal: Ability to describe self-care measures that may prevent or decrease complications (Diabetes Survival Skills Education) will improve Outcome: Progressing Goal: Individualized Educational Video(s) Outcome: Progressing   Problem: Coping: Goal: Ability to adjust to condition or change in health will improve Outcome: Progressing   Problem: Nutritional: Goal: Maintenance of adequate nutrition will improve Outcome: Progressing Goal: Progress toward achieving an optimal weight will improve Outcome: Progressing   Problem: Skin Integrity: Goal: Risk for impaired skin integrity will decrease Outcome: Progressing   Problem: Elimination: Goal: Will not experience complications related to bowel motility Outcome: Progressing Goal: Will not experience complications related to urinary retention Outcome: Progressing   Problem: Safety: Goal: Ability to remain free from injury will improve Outcome: Progressing

## 2023-07-15 NOTE — Evaluation (Signed)
Physical Therapy Evaluation Patient Details Name: Lawrence [name] MRN: [id number] DOB: 22-Oct-1955 Today's Date: 07/15/2023  History of Present Illness  67 y.o. male who presented after having a fall. CT head no acute changes. EEG WNL. MRI brain no acute changes. PMH atrial fibrillation on anticoagulation, prior CVA with residual left-sided weakness, Nov '24 L cerebellar CVA, type 2 diabetes with neuropathy, seizure disorder on antiepileptics, CAD, COPD, OSA, spinal stimulator, bil LE stasis ulcers, history of severe aortic stenosis status post TAVR  Clinical Impression   Pt admitted secondary to problem above with deficits below. PTA patient was home from SNF x 1 day after rehabilitating from Lt cerebellar CVA in Nov '24. Reports he was dizzy before he fell and then "everything just gave out." Pt currently requires min assist to stand at EOB, however could not stand entire time to get standing BP due to dizziness and returned to sitting. Unable to get him OOB to chair as he would not be able to stand from recliner (he uses a lift chair at home).  Anticipate patient will benefit from PT to address problems listed below.Will continue to follow acutely to maximize functional mobility independence and safety. Patient will benefit from continued inpatient follow up therapy, <3 hours/day. If dizziness improves, could consider home with HHPT.           If plan is discharge home, recommend the following: Assistance with cooking/housework;Assist for transportation;Help with stairs or ramp for entrance;Two people to help with walking and/or transfers   Can travel by private vehicle   No    Equipment Recommendations None recommended by PT  Recommendations for Other Services  OT consult    Functional Status Assessment Patient has had a recent decline in their functional status and demonstrates the ability to make significant improvements in function in a reasonable and predictable amount of time.      Precautions / Restrictions Precautions Precautions: Fall Precaution Comments: Slight L hemiparesis; reports dizzy before he fell at home Restrictions Weight Bearing Restrictions: No      Mobility  Bed Mobility Overal bed mobility: Modified Independent Bed Mobility: Supine to Sit, Sit to Supine     Supine to sit: Modified independent (Device/Increase time), HOB elevated, Used rails Sit to supine: Independent   General bed mobility comments: with HOB flat, able to scoot himself up to [facility] with use of bil UEs on rails    Transfers Overall transfer level: Needs assistance Equipment used: Rollator (4 wheels) Transfers: Sit to/from Stand Sit to Stand: Min assist, From elevated surface (bed elevated to simulate home)           General transfer comment: unable to pivot to recliner as pt would be unable to stand from low surface and due to dizziness with need to sit prior to standing BP even completed    Ambulation/Gait               General Gait Details: unable  Stairs            Wheelchair Mobility     Tilt Bed    Modified Rankin (Stroke Patients Only)       Balance Overall balance assessment: Needs assistance, History of Falls Sitting-balance support: No upper extremity supported, Feet supported Sitting balance-Leahy Scale: Good     Standing balance support: Bilateral upper extremity supported, During functional activity, Reliant on assistive device for balance Standing balance-Leahy Scale: Poor  Pertinent Vitals/Pain Pain Assessment Pain Assessment: 0-10 Pain Score: 4  Pain Location: bil feet Pain Descriptors / Indicators: Burning Pain Intervention(s): Limited activity within patient's tolerance, Monitored during session, Premedicated before session    Home Living Family/patient expects to be discharged to:: Private residence Living Arrangements: Spouse/significant other Available Help at Discharge:  Family;Available 24 hours/day;Personal care attendant (4 days/week x 4 hours/day) Type of Home: Apartment Home Access: Level entry       Home Layout: One level Home Equipment: Rollator (4 wheels);Electric scooter;Hospital bed;BSC/3in1;Lift chair      Prior Function Prior Level of Function : Needs assist             Mobility Comments: Rollator for household mobility (short distances); Art gallery manager in community ADLs Comments: aide assists with bathing/dressing, provides set up A for grooming tasks, light household chores,  utilizes transportation services. uses BSC does not go into bathroom; enjoys going into the community     Extremity/Trunk Assessment   Upper Extremity Assessment Upper Extremity Assessment: Generalized weakness    Lower Extremity Assessment Lower Extremity Assessment: Generalized weakness    Cervical / Trunk Assessment Cervical / Trunk Assessment: Other exceptions Cervical / Trunk Exceptions: morbid obesity  Communication   Communication Communication: No apparent difficulties Cueing Techniques: Verbal cues  Cognition Arousal: Alert Behavior During Therapy: WFL for tasks assessed/performed Overall Cognitive Status: Within Functional Limits for tasks assessed                                          General Comments General comments (skin integrity, edema, etc.): See vitals flowsheet for orthostatic BPs    Exercises     Assessment/Plan    PT Assessment Patient needs continued PT services  PT Problem List Decreased strength;Decreased activity tolerance;Decreased balance;Decreased mobility;Decreased knowledge of use of DME;Cardiopulmonary status limiting activity;Obesity       PT Treatment Interventions DME instruction;Gait training;Stair training;Functional mobility training;Therapeutic activities;Therapeutic exercise;Balance training;Neuromuscular re-education;Patient/family education    PT Goals (Current goals can be  found in the Care Plan section)  Acute Rehab PT Goals Patient Stated Goal: to get better PT Goal Formulation: With patient Time For Goal Achievement: 07/29/23 Potential to Achieve Goals: Good    Frequency Min 1X/week     Co-evaluation               AM-PAC PT "6 Clicks" Mobility  Outcome Measure Help needed turning from your back to your side while in a flat bed without using bedrails?: A Little Help needed moving from lying on your back to sitting on the side of a flat bed without using bedrails?: A Little Help needed moving to and from a bed to a chair (including a wheelchair)?: A Little Help needed standing up from a chair using your arms (e.g., wheelchair or bedside chair)?: A Lot Help needed to walk in hospital room?: Total Help needed climbing 3-5 steps with a railing? : Total 6 Click Score: 13    End of Session   Activity Tolerance: Treatment limited secondary to medical complications (Comment) (dizziness, drop in DBP) Patient left: in bed;with call bell/phone within reach;with bed alarm set Nurse Communication: Mobility status;Other (comment) (orthostatic BPs) PT Visit Diagnosis: Other abnormalities of gait and mobility (R26.89);History of falling (Z91.81);Dizziness and giddiness (R42)    Time: 9562-1308 PT Time Calculation (min) (ACUTE ONLY): 34 min   Charges:   PT Evaluation $PT Eval  Low Complexity: 1 Low PT Treatments $Therapeutic Activity: 8-22 mins PT General Charges $$ ACUTE PT VISIT: 1 Visit          [name], PT Acute Rehabilitation Services  Office [phone]   [name] 07/15/2023, 11:52 AM

## 2023-07-15 NOTE — Plan of Care (Signed)
MRI brain No acute intracranial abnormality. Minimal chronic small vessel disease.  EEG: This study is within normal limits. No seizures or definite epileptiform discharges were seen throughout the recording. A normal interictal EEG does not exclude the diagnosis of epilepsy.  As per Dr. Derry Lory original note from 12/6 "- no further workup if negative. - we will follow up on MRI Brain and rEEG but do not plan to see him again. IF the primary team would like [place] to follow along or re-evaluate, please reach out to [place]."  Discussed with primary team via secure chat  Brooke Dare MD-PhD [facility] [phone] Available 7 AM to 7 PM, outside these hours please contact Neurologist on call listed on AMION

## 2023-07-16 DIAGNOSIS — R531 Weakness: Secondary | ICD-10-CM | POA: Diagnosis not present

## 2023-07-16 LAB — CBC
HCT: 44 % (ref 39.0–52.0)
Hemoglobin: 14.4 g/dL (ref 13.0–17.0)
MCH: 29.6 pg (ref 26.0–34.0)
MCHC: 32.7 g/dL (ref 30.0–36.0)
MCV: 90.3 fL (ref 80.0–100.0)
Platelets: 195 10*3/uL (ref 150–400)
RBC: 4.87 MIL/uL (ref 4.22–5.81)
RDW: 14.7 % (ref 11.5–15.5)
WBC: 7.2 10*3/uL (ref 4.0–10.5)
nRBC: 0 % (ref 0.0–0.2)

## 2023-07-16 LAB — BASIC METABOLIC PANEL
Anion gap: 7 (ref 5–15)
BUN: 15 mg/dL (ref 8–23)
CO2: 22 mmol/L (ref 22–32)
Calcium: 8.8 mg/dL — ABNORMAL LOW (ref 8.9–10.3)
Chloride: 105 mmol/L (ref 98–111)
Creatinine, Ser: 0.85 mg/dL (ref 0.61–1.24)
GFR, Estimated: >60 mL/min (ref >60)
Glucose, Bld: 101 mg/dL — ABNORMAL HIGH (ref 70–99)
Potassium: 3.5 mmol/L (ref 3.5–5.1)
Sodium: 134 mmol/L — ABNORMAL LOW (ref 135–145)

## 2023-07-16 LAB — GLUCOSE, CAPILLARY
Glucose-Capillary: 100 mg/dL — ABNORMAL HIGH (ref 70–99)
Glucose-Capillary: 148 mg/dL — ABNORMAL HIGH (ref 70–99)
Glucose-Capillary: 128 mg/dL — ABNORMAL HIGH (ref 70–99)
Glucose-Capillary: 121 mg/dL — ABNORMAL HIGH (ref 70–99)

## 2023-07-16 NOTE — Progress Notes (Signed)
   07/15/23 2351  BiPAP/CPAP/SIPAP  Reason BIPAP/CPAP not in use Non-compliant (pt refused)  BiPAP/CPAP /SiPAP Vitals  Temp 97.7 F (36.5 C)  Pulse Rate 74  Resp 17  BP 120/66  SpO2 96 %  MEWS Score/Color  MEWS Score 0  MEWS Score Color Green

## 2023-07-16 NOTE — Progress Notes (Signed)
Physical Therapy Treatment Patient Details Name: Lawrence [name] MRN: [id number] DOB: Apr 12, 1956 Today's Date: 07/16/2023   History of Present Illness 67 y.o. male who presented after having a fall. CT head no acute changes. EEG WNL. MRI brain no acute changes. PMH atrial fibrillation on anticoagulation, prior CVA with residual left-sided weakness, Nov '24 L cerebellar CVA, type 2 diabetes with neuropathy, seizure disorder on antiepileptics, CAD, COPD, OSA, spinal stimulator, bil LE stasis ulcers, history of severe aortic stenosis status post TAVR    PT Comments  Patient with knee high TED hose donned and wife present. BP still dropped (both SBP and DBP) with standing >1 minute (see vitals flowsheet; he was unable to fully make it to 3 minutes due to severe dizziness).  BP recovered in sitting and pt able to stand-pivot from bed to chair to allow time upright. With wife present, got a better understanding of the fact pt has been having dizzy spells at home, and prior to this fall they had been manageable with pt returning to sitting if he was too dizzy to walk. Dizziness was less severe today, but still worse than his baseline dizziness. Patient and wife hopeful he will continue to improve and be able to go home Monday. Discharge plan updated for HHPT.    If plan is discharge home, recommend the following: Assistance with cooking/housework;Assist for transportation;Help with stairs or ramp for entrance;A little help with walking and/or transfers;A little help with bathing/dressing/bathroom   Can travel by private vehicle        Equipment Recommendations  None recommended by PT    Recommendations for Other Services OT consult     Precautions / Restrictions Precautions Precautions: Fall Precaution Comments: Slight L hemiparesis; reports dizzy before he fell at home Restrictions Weight Bearing Restrictions: No     Mobility  Bed Mobility Overal bed mobility: Needs Assistance Bed  Mobility: Supine to Sit     Supine to sit: Min assist     General bed mobility comments: with HOB flat and no rail required min assist to come to sit    Transfers Overall transfer level: Needs assistance Equipment used: Rollator (4 wheels) Transfers: Sit to/from Stand Sit to Stand: From elevated surface, Contact guard assist (bed elevated to simulate home)   Step pivot transfers: Contact guard assist       General transfer comment: after seated rest post-orthostatic BPs, pt able to step-pivot to recliner (built up seat for incr ease of standing from recliner)    Ambulation/Gait               General Gait Details: unable due to severe dizziness and drop in BP with standing x 2 minutes   Stairs             Wheelchair Mobility     Tilt Bed    Modified Rankin (Stroke Patients Only) Modified Rankin (Stroke Patients Only) Pre-Morbid Rankin Score: Moderate disability Modified Rankin: Moderately severe disability     Balance Overall balance assessment: Needs assistance, History of Falls Sitting-balance support: No upper extremity supported, Feet supported Sitting balance-Leahy Scale: Good Sitting balance - Comments: sitting EOB   Standing balance support: Bilateral upper extremity supported, During functional activity, Reliant on assistive device for balance Standing balance-Leahy Scale: Poor Standing balance comment: reliant on RW support                            Cognition Arousal: Alert Behavior During  Therapy: WFL for tasks assessed/performed Overall Cognitive Status: Within Functional Limits for tasks assessed                                          Exercises      General Comments General comments (skin integrity, edema, etc.): Wife present. Reports he has been having dizziness with changes in position for sometime, however these episodes are worse (he feels worse).      Pertinent Vitals/Pain Pain  Assessment Pain Assessment: No/denies pain    Home Living                          Prior Function            PT Goals (current goals can now be found in the care plan section) Acute Rehab PT Goals Patient Stated Goal: to get better PT Goal Formulation: With patient Time For Goal Achievement: 07/29/23 Potential to Achieve Goals: Good Progress towards PT goals: Progressing toward goals    Frequency    Min 1X/week      PT Plan      Co-evaluation              AM-PAC PT "6 Clicks" Mobility   Outcome Measure  Help needed turning from your back to your side while in a flat bed without using bedrails?: A Little Help needed moving from lying on your back to sitting on the side of a flat bed without using bedrails?: A Little Help needed moving to and from a bed to a chair (including a wheelchair)?: A Little Help needed standing up from a chair using your arms (e.g., wheelchair or bedside chair)?: A Little Help needed to walk in hospital room?: Total (<20 ft) Help needed climbing 3-5 steps with a railing? : Total 6 Click Score: 14    End of Session   Activity Tolerance: Treatment limited secondary to medical complications (Comment) (dizziness, drop in DBP) Patient left: with call bell/phone within reach;in chair;with chair alarm set;with family/visitor present Nurse Communication: Mobility status;Other (comment) (orthostatic BPs) PT Visit Diagnosis: Other abnormalities of gait and mobility (R26.89);History of falling (Z91.81);Dizziness and giddiness (R42)     Time: 9147-8295 PT Time Calculation (min) (ACUTE ONLY): 31 min  Charges:    $Gait Training: 8-22 mins $Therapeutic Activity: 8-22 mins PT General Charges $$ ACUTE PT VISIT: 1 Visit                      [name], PT Acute Rehabilitation Services  Office [phone]    [name] 07/16/2023, 10:02 AM

## 2023-07-16 NOTE — Plan of Care (Signed)
  Problem: Coping: Goal: Ability to adjust to condition or change in health will improve Outcome: Progressing   Problem: Fluid Volume: Goal: Ability to maintain a balanced intake and output will improve Outcome: Progressing   Problem: Skin Integrity: Goal: Risk for impaired skin integrity will decrease Outcome: Progressing   Problem: Safety: Goal: Ability to remain free from injury will improve Outcome: Progressing   Problem: Pain Management: Goal: General experience of comfort will improve Outcome: Progressing   Problem: Elimination: Goal: Will not experience complications related to bowel motility Outcome: Progressing Goal: Will not experience complications related to urinary retention Outcome: Progressing

## 2023-07-16 NOTE — Progress Notes (Signed)
PROGRESS NOTE    Lawrence [name]  NWG:[id number] DOB: 1956-06-23 DOA: 07/14/2023 PCP: [name], PA-C    Brief Narrative:  Lawrence [name] is a 67 y.o. male with medical history significant of hypertension, atrial fibrillation on Eliquis, CAD, diastolic congestive heart failure, s/p TAVR, recent left cerebellar stroke, DM 2, seizure disorder, COPD, spinal stimulator who presented after having a mechanical fall. Denied any loss of consciousness or trauma to his head. Records note he had just recently been hospitalized from in November after being noted to have left-sided weakness and slurred speech found to have a left cerebellar infarct.  He went to rehab thereafter and was just discharged home 2 days ago PTA. Patient's wife also notes that he has been compliant with CPAP.  Patient admitted for further management.    Today, patient denies any new complaints, still reports some lightheadedness.  Currently orthostatic negative   Assessment and Plan:  Fall at home Likely orthostatic hypotension, noted positive orthostatic vitals on 12/7 Repeat orthostatic vitals negative s/p IV fluids, TED hose Fall precautions PT/OT to evaluate and treat  History of CVA with residual deficit on L Acute metabolic encephalopathy Patient had just recently been hospitalized in November found to have a 5 mm acute ischemic nonhemorrhagic left cerebellar infarct Repeat MRI with no acute infarct EEG negative UA negative Chest x-ray unremarkable PT/OT, monitor closely  Chronic diastolic CHF BNP 57.8, falsely low and morbid obesity Last echocardiogram noted EF to be 55 to 60% with grade 1 diastolic dysfunction back on 03/10/2023. Strict intake and output, daily weights Continue to hold torsemide due to AKI  Seizure disorder No seizure activity was reported Seizure precautions Continue Keppra and lacosamide  Paroxysmal atrial fibrillation on chronic anticoagulation Patient appears to be in a  sinus rhythm at this time CHA2DS2-VASc score appears to be greater than 5. Continue Eliquis and beta-blocker   Acute kidney injury superimposed on chronic kidney disease stage IIIa Resolved Creatinine noted to be 1.86 with BUN 29 Baseline creatinine 1.1-1.4.  Controlled diabetes mellitus type 2, with long-term use of insulin SSI, semglee   Essential hypertension Continue metoprolol   COPD Continue home inhaler, DuoNebs    Chronic bilateral lower extremity stasis ulcers Continue wound care   OSA on CPAP Continue CPAP at night   Morbid obesity Estimated body mass index is 44.94 kg/m as calculated from the following:   Height as of this encounter: 6\' 2"  (1.88 m).   Weight as of this encounter: 158.8 kg.   DVT prophylaxis: Place TED hose Start: 07/15/23 1145 apixaban (ELIQUIS) tablet 5 mg    Code Status: Do not attempt resuscitation (DNR) PRE-ARREST INTERVENTIONS DESIRED Family Communication: None at bedside  Disposition Plan:  Level of care: Med-Surg Status is: Inpatient The patient will require care spanning > 2 midnights and should be moved to inpatient because: Level of care    Consultants:  Neuro (signed off)   Subjective: Still with some lightheadedness, denied any dizziness upon standing today, orthostatic negative.    Objective: Vitals:   07/16/23 0734 07/16/23 0955 07/16/23 1115 07/16/23 1607  BP: 129/71 (!) 143/87 138/71 (!) 167/99  Pulse: 79  71 70  Resp: 18  17 17   Temp: 98 F (36.7 C)  97.6 F (36.4 C) 97.6 F (36.4 C)  TempSrc: Oral  Oral Oral  SpO2: 97%  96% 96%  Weight:      Height:        Intake/Output Summary (Last 24 hours) at 07/16/2023 1701  Last data filed at 07/16/2023 1650 Gross per 24 hour  Intake 503 ml  Output 2780 ml  Net -2277 ml   Filed Weights   07/14/23 0353  Weight: (!) 158.8 kg    Examination: General: NAD, morbid obesity Cardiovascular: S1, S2 present Respiratory: Diminished breath sounds  bilaterally Abdomen: Soft, nontender, nondistended, bowel sounds present Musculoskeletal: bilateral pedal edema noted Skin: Normal Psychiatry: Normal mood        Data Reviewed: I have personally reviewed following labs and imaging studies  CBC: Recent Labs  Lab 07/11/23 0453 07/14/23 0314 07/14/23 0316 07/14/23 0601 07/15/23 0434 07/16/23 0634  WBC 10.2 10.5  --   --  9.0 7.2  NEUTROABS  --  6.7  --   --   --   --   HGB 13.9 14.7 16.0 15.0 13.6 14.4  HCT 43.5 46.6 47.0 44.0 42.3 44.0  MCV 89.9 90.8  --   --  90.4 90.3  PLT 246 240  --   --  243 195   Basic Metabolic Panel: Recent Labs  Lab 07/11/23 0453 07/14/23 0314 07/14/23 0316 07/14/23 0601 07/15/23 0434 07/16/23 0634  NA 140 140 142 142 139 134*  K 3.7 4.0 4.1 3.5 3.6 3.5  CL 104 102 105  --  105 105  CO2 27 24  --   --  27 22  GLUCOSE 109* 122* 119*  --  141* 101*  BUN 18 29* 35*  --  20 15  CREATININE 1.10 1.86* 2.00*  --  1.07 0.85  CALCIUM 8.9 9.6  --   --  8.7* 8.8*   GFR: Estimated Creatinine Clearance: 136.4 mL/min (by C-G formula based on SCr of 0.85 mg/dL). Liver Function Tests: Recent Labs  Lab 07/14/23 0314  AST 21  ALT 24  ALKPHOS 64  BILITOT 0.6  PROT 7.3  ALBUMIN 4.0   No results for input(s): "LIPASE", "AMYLASE" in the last 168 hours. Recent Labs  Lab 07/14/23 0628  AMMONIA 31   Coagulation Profile: Recent Labs  Lab 07/14/23 0314  INR 1.1   Cardiac Enzymes: No results for input(s): "CKTOTAL", "CKMB", "CKMBINDEX", "TROPONINI" in the last 168 hours. BNP (last 3 results) No results for input(s): "PROBNP" in the last 8760 hours. HbA1C: No results for input(s): "HGBA1C" in the last 72 hours. CBG: Recent Labs  Lab 07/15/23 1644 07/15/23 2138 07/16/23 0626 07/16/23 1115 07/16/23 1608  GLUCAP 138* 129* 100* 148* 128*   Lipid Profile: No results for input(s): "CHOL", "HDL", "LDLCALC", "TRIG", "CHOLHDL", "LDLDIRECT" in the last 72 hours. Thyroid Function Tests: Recent  Labs    07/14/23 0628  TSH 1.916   Anemia Panel: No results for input(s): "VITAMINB12", "FOLATE", "FERRITIN", "TIBC", "IRON", "RETICCTPCT" in the last 72 hours. Sepsis Labs: No results for input(s): "PROCALCITON", "LATICACIDVEN" in the last 168 hours.  No results found for this or any previous visit (from the past 240 hour(s)).       Radiology Studies: MR BRAIN WO CONTRAST  Result Date: 07/14/2023 CLINICAL DATA:  Acute neurologic deficit.  Left-sided weakness. EXAM: MRI HEAD WITHOUT CONTRAST TECHNIQUE: Multiplanar, multiecho pulse sequences of the brain and surrounding structures were obtained without intravenous contrast. COMPARISON:  06/25/2023 FINDINGS: Brain: No acute infarct, mass effect or extra-axial collection. No acute or chronic hemorrhage. Minimal multifocal hyperintense T2-weight signal within the white matter. The midline structures are normal. Vascular: Normal flow voids. Skull and upper cervical spine: Normal calvarium and skull base. Visualized upper cervical spine and soft tissues are  normal. Sinuses/Orbits:No paranasal sinus fluid levels or advanced mucosal thickening. No mastoid or middle ear effusion. Normal orbits. IMPRESSION: No acute intracranial abnormality. Minimal chronic small vessel disease. Electronically Signed   By: Deatra Robinson M.D.   On: 07/14/2023 22:38        Scheduled Meds:  allopurinol  100 mg Oral Daily   apixaban  5 mg Oral BID   ARIPiprazole  5 mg Oral QPM   busPIRone  10 mg Oral TID   colchicine  0.6 mg Oral Daily   cyanocobalamin  1,000 mcg Oral Daily   fenofibrate  160 mg Oral Daily   fluticasone  2 spray Each Nare Daily   fluticasone furoate-vilanterol  1 puff Inhalation Daily   And   umeclidinium bromide  1 puff Inhalation Daily   icosapent Ethyl  2 g Oral BID   insulin aspart  0-9 Units Subcutaneous TID WC   insulin glargine-yfgn  30 Units Subcutaneous Daily   lacosamide  150 mg Oral BID   levETIRAcetam  500 mg Oral BID    metoprolol succinate  25 mg Oral Daily   pantoprazole  40 mg Oral Daily   polyethylene glycol  17 g Oral Daily   pregabalin  150 mg Oral TID   rosuvastatin  40 mg Oral Daily   senna-docusate  1 tablet Oral QHS   sodium chloride flush  3 mL Intravenous Q12H   traZODone  50 mg Oral QHS   venlafaxine XR  225 mg Oral Q breakfast   Continuous Infusions:   LOS: 1 day        Briant Cedar, MD [facility] Available via Epic secure chat 7am-7pm After these hours, please refer to coverage provider listed on [email or web address] 07/16/2023, 5:01 PM

## 2023-07-16 NOTE — Progress Notes (Signed)
   07/16/23 2117  BiPAP/CPAP/SIPAP  Reason BIPAP/CPAP not in use Non-compliant (machine in room)

## 2023-07-17 ENCOUNTER — Other Ambulatory Visit (HOSPITAL_COMMUNITY): Payer: Self-pay

## 2023-07-17 ENCOUNTER — Ambulatory Visit: Payer: Medicare HMO | Admitting: Surgery

## 2023-07-17 DIAGNOSIS — R531 Weakness: Secondary | ICD-10-CM | POA: Diagnosis not present

## 2023-07-17 LAB — BASIC METABOLIC PANEL
Anion gap: 7 (ref 5–15)
BUN: 11 mg/dL (ref 8–23)
CO2: 27 mmol/L (ref 22–32)
Calcium: 9.2 mg/dL (ref 8.9–10.3)
Chloride: 105 mmol/L (ref 98–111)
Creatinine, Ser: 0.91 mg/dL (ref 0.61–1.24)
GFR, Estimated: >60 mL/min (ref >60)
Glucose, Bld: 139 mg/dL — ABNORMAL HIGH (ref 70–99)
Potassium: 3.7 mmol/L (ref 3.5–5.1)
Sodium: 139 mmol/L (ref 135–145)

## 2023-07-17 LAB — GLUCOSE, CAPILLARY
Glucose-Capillary: 144 mg/dL — ABNORMAL HIGH (ref 70–99)
Glucose-Capillary: 151 mg/dL — ABNORMAL HIGH (ref 70–99)
Glucose-Capillary: 213 mg/dL — ABNORMAL HIGH (ref 70–99)
Glucose-Capillary: 173 mg/dL — ABNORMAL HIGH (ref 70–99)
Glucose-Capillary: 146 mg/dL — ABNORMAL HIGH (ref 70–99)

## 2023-07-17 MED ORDER — HYDROCERIN EX CREA
1.0000 | TOPICAL_CREAM | Freq: Two times a day (BID) | CUTANEOUS | 0 refills | Status: AC
  Filled 2023-07-17: qty 240, 30d supply, fill #0

## 2023-07-17 MED ORDER — HYDROCERIN EX CREA
TOPICAL_CREAM | Freq: Two times a day (BID) | CUTANEOUS | Status: DC
  Filled 2023-07-17: qty 113

## 2023-07-17 NOTE — Progress Notes (Signed)
Physical Therapy Treatment Patient Details Name: Lawrence [name] MRN: [id number] DOB: 10/07/1955 Today's Date: 07/17/2023   History of Present Illness 67 y.o. male who presented after having a fall. CT head no acute changes. EEG WNL. MRI brain no acute changes. +orthostasis  PMH atrial fibrillation on anticoagulation, prior CVA with residual left-sided weakness, Nov '24 L cerebellar CVA, type 2 diabetes with neuropathy, seizure disorder on antiepileptics, CAD, COPD, OSA, spinal stimulator, bil LE stasis ulcers, history of severe aortic stenosis status post TAVR    PT Comments  Patient up in recliner on arrival. Reported mild dizziness when he got up with nursing. Orthostatic BPs taken in sitting (145/82 HR 66) and standing (132/94 HR 72). Did not wait for 3 minute standing BP in order to progress to ambulation with pt able to walk 70 ft with CGA and rollator. Seated rest due to fatigue. Walked again x 35 ft with sitting due to dizziness which quickly cleared once seated. Discussed discharge plan. Patient has been dealing with orthostasis and dizziness for some time now. Discussed he will get up and move more at home than at [name] and pt agrees he would rather go home with HHPT. Will continue to follow acutely.    If plan is discharge home, recommend the following: Assistance with cooking/housework;Assist for transportation;Help with stairs or ramp for entrance;A little help with walking and/or transfers;A little help with bathing/dressing/bathroom   Can travel by private vehicle     Yes  Equipment Recommendations  None recommended by PT    Recommendations for Other Services       Precautions / Restrictions Precautions Precautions: Fall Precaution Comments: Slight L hemiparesis; reports dizzy before he fell at home Restrictions Weight Bearing Restrictions: No     Mobility  Bed Mobility               General bed mobility comments: up in recliner    Transfers Overall transfer  level: Needs assistance Equipment used: Rollator (4 wheels) Transfers: Sit to/from Stand Sit to Stand: From elevated surface, Min assist           General transfer comment: from built-up recliner x 2 reps with min assist for boosting    Ambulation/Gait Ambulation/Gait assistance: +2 safety/equipment, Contact guard assist Gait Distance (Feet): 50 Feet (seated rest; 35) Assistive device: Rollator (4 wheels) Gait Pattern/deviations: Knee hyperextension - right, Decreased stride length, Step-through pattern, Trunk flexed, Antalgic Gait velocity: decreased     General Gait Details: initially stopped to rest due to fatigue; 2nd walk stopped because of dizziness   Stairs             Wheelchair Mobility     Tilt Bed    Modified Rankin (Stroke Patients Only)       Balance Overall balance assessment: Needs assistance, History of Falls Sitting-balance support: No upper extremity supported, Feet supported Sitting balance-Leahy Scale: Good     Standing balance support: Bilateral upper extremity supported, During functional activity, Reliant on assistive device for balance Standing balance-Leahy Scale: Poor Standing balance comment: reliant on RW support                            Cognition Arousal: Alert Behavior During Therapy: WFL for tasks assessed/performed Overall Cognitive Status: Within Functional Limits for tasks assessed  Exercises      General Comments General comments (skin integrity, edema, etc.): See vitals flowsheet for BPs. Discussion re: pros's and con's of HH vs SNF and pt in agreement with plan to discharge home when medically appropriate      Pertinent Vitals/Pain Pain Assessment Pain Assessment: Faces Faces Pain Scale: Hurts little more Pain Location: rt knee Pain Descriptors / Indicators: Aching Pain Intervention(s): Limited activity within patient's tolerance, Monitored  during session    Home Living                          Prior Function            PT Goals (current goals can now be found in the care plan section) Acute Rehab PT Goals Patient Stated Goal: to get better PT Goal Formulation: With patient Time For Goal Achievement: 07/29/23 Potential to Achieve Goals: Good Progress towards PT goals: Progressing toward goals    Frequency    Min 1X/week      PT Plan      Co-evaluation              AM-PAC PT "6 Clicks" Mobility   Outcome Measure  Help needed turning from your back to your side while in a flat bed without using bedrails?: A Little Help needed moving from lying on your back to sitting on the side of a flat bed without using bedrails?: A Little Help needed moving to and from a bed to a chair (including a wheelchair)?: A Little Help needed standing up from a chair using your arms (e.g., wheelchair or bedside chair)?: A Little Help needed to walk in hospital room?: A Little Help needed climbing 3-5 steps with a railing? : Total 6 Click Score: 16    End of Session Equipment Utilized During Treatment: Gait belt Activity Tolerance: Patient limited by fatigue Patient left: with call bell/phone within reach;in chair;with chair alarm set   PT Visit Diagnosis: Other abnormalities of gait and mobility (R26.89);History of falling (Z91.81);Dizziness and giddiness (R42)     Time: 5284-1324 PT Time Calculation (min) (ACUTE ONLY): 27 min  Charges:    $Gait Training: 23-37 mins PT General Charges $$ ACUTE PT VISIT: 1 Visit                      Lawrence [name], PT Acute Rehabilitation Services  Office [phone]    Lawrence [name] 07/17/2023, 9:54 AM

## 2023-07-17 NOTE — Consult Note (Signed)
WOC Nurse Consult Note: Reason for Consult: LLE wound  Wound type: scattered partial thickness scabbed areas (0.2 cm x 0.2 cm)  Pressure Injury POA: NA  Measurement: as above  Wound bed: dry scabbed  Drainage (amount, consistency, odor) none Periwound: edema, peeling skin  Dressing procedure/placement/frequency: Clean B legs with soap and water, apply Eucerin cream 2 times daily. DO NOT PLACE IN BETWEEN TOES.   Patient states home health comes out and wraps legs "when they need it". No open wounds and no weeping noted at todays visit.   POC discussed with patient.  WOC team will not follow.  Re-consult if further needs arise.   Thank you,    Priscella Mann MSN, RN-BC, [employer] [phone]

## 2023-07-17 NOTE — Progress Notes (Signed)
DC summary reviewed with patient, all questions answered. Awaiting ambulance to transport.    07/17/23 1229  Vitals  Temp 98.2 F (36.8 C)  Temp Source Oral  BP (!) 140/86  MAP (mmHg) 96  BP Location Right Arm  Patient Position (if appropriate) Sitting  Pulse Rate 76  Resp 18  MEWS COLOR  MEWS Score Color Green  MEWS Score  MEWS Temp 0  MEWS Systolic 0  MEWS Pulse 0  MEWS RR 0  MEWS LOC 0  MEWS Score 0

## 2023-07-17 NOTE — Plan of Care (Signed)
  Problem: Fluid Volume: Goal: Ability to maintain a balanced intake and output will improve Outcome: Progressing   Problem: Nutritional: Goal: Maintenance of adequate nutrition will improve Outcome: Progressing Goal: Progress toward achieving an optimal weight will improve Outcome: Progressing   Problem: Skin Integrity: Goal: Risk for impaired skin integrity will decrease Outcome: Progressing   Problem: Activity: Goal: Risk for activity intolerance will decrease Outcome: Progressing   Problem: Pain Management: Goal: General experience of comfort will improve Outcome: Progressing   Problem: Safety: Goal: Ability to remain free from injury will improve Outcome: Progressing   Problem: Skin Integrity: Goal: Risk for impaired skin integrity will decrease Outcome: Progressing

## 2023-07-17 NOTE — Discharge Summary (Signed)
Physician Discharge Summary   Patient: Lawrence [name] MRN: [id number] DOB: 1955/08/26  Admit date:     07/14/2023  Discharge date: 07/17/23  Discharge Physician: Briant Cedar   PCP: [name], PA-C     Recommendations at discharge:  Follow-up with PCP in 1 week Follow-up with cardiology on 08/04/2023  Discharge Diagnoses: Principal Problem:   Left-sided weakness Active Problems:   Acute metabolic encephalopathy   Acute respiratory failure with hypoxia and hypercapnia (HCC)   Acute kidney injury superimposed on stage 3a chronic kidney disease (HCC)   Fall at home, initial encounter   OSA (obstructive sleep apnea)   Type 2 diabetes mellitus with obesity (HCC)   Chronic diastolic CHF (congestive heart failure) (HCC)   Primary hypertension   Seizure disorder (HCC)   AF (paroxysmal atrial fibrillation) (HCC)   COPD (chronic obstructive pulmonary disease) (HCC)   Hemiparesis affecting left side as late effect of cerebrovascular accident (CVA) (HCC)   Obesity, Class III, BMI 40-49.9 (morbid obesity) (HCC)   History of CVA with residual deficit   [facility] Course: Lawrence [name] is a 67 y.o. male with medical history significant of hypertension, atrial fibrillation on Eliquis, CAD, diastolic congestive heart failure, s/p TAVR, recent left cerebellar stroke, DM 2, seizure disorder, COPD, spinal stimulator who presented after having a mechanical fall. Denied any loss of consciousness or trauma to his head. Records note he had just recently been hospitalized from in November after being noted to have left-sided weakness and slurred speech found to have a left cerebellar infarct.  He went to rehab thereafter and was just discharged home 2 days ago PTA. Patient's wife also notes that he has been compliant with CPAP.  Patient admitted for further management.    Today, patient still reports feeling better.  Denies any further dizziness upon standing.  Advised  patient to follow-up closely with cardiology/CHF clinic for possible diuretics adjustment.  Patient was found to be orthostatic positive, with dizziness/lightheadedness.  Discussed with cardiology, appointment made for 08/04/2023, to be followed outpt.  Discussed extensively with patient, if persistent lightheadedness/dizziness worsens, call office to possible adjust medications.    Assessment and Plan: Fall at home Likely orthostatic hypotension, noted positive orthostatic vitals on 12/7 Vs polypharmacy Vs recent CVA Vs deconditioning Repeat orthostatic vitals negative s/p IV fluids, TED hose Fall precautions PT/OT to evaluate and treat- HH   History of CVA with residual deficit on L Acute metabolic encephalopathy Patient had just recently been hospitalized in November found to have a 5 mm acute ischemic nonhemorrhagic left cerebellar infarct Repeat MRI with no acute infarct EEG negative UA negative Chest x-ray unremarkable   Chronic diastolic CHF BNP 47.4, falsely low and morbid obesity Last echocardiogram noted EF to be 55 to 60% with grade 1 diastolic dysfunction back on 03/10/2023 Held home torsemide due to AKI Restart home torsemide, but may need adjustments due to symptomatic orthostatic vitals   Seizure disorder No seizure activity was reported Seizure precautions Continue Keppra and lacosamide   Paroxysmal atrial fibrillation on chronic anticoagulation Patient appears to be in a sinus rhythm at this time CHA2DS2-VASc score appears to be greater than 5. Continue Eliquis and beta-blocker   Acute kidney injury superimposed on chronic kidney disease stage IIIa Resolved Creatinine noted to be 1.86 with BUN 29 Baseline creatinine 1.1-1.4.   Controlled diabetes mellitus type 2, with long-term use of insulin SSI, semglee   Essential hypertension Continue metoprolol   COPD Continue home  inhaler, DuoNebs    Chronic bilateral lower extremity stasis ulcers Continue wound  care   OSA on CPAP Continue CPAP at night   Morbid obesity Estimated body mass index is 44.94 kg/m as calculated from the following:   Height as of this encounter: 6\' 2"  (1.88 m).   Weight as of this encounter: 158.8 kg.      Consultants: None Procedures performed: None Disposition: Home health Diet recommendation:  Cardiac diet    DISCHARGE MEDICATION: Allergies as of 07/17/2023       Reactions   Vancomycin Other (See Comments)   "Red Man Syndrome" 02/02/17: possible cause for rash under both arms   Niacin And Related Other (See Comments)   Red man syndrome   Tubersol [tuberculin, Ppd] Other (See Comments)   Reaction unknown   Doxycycline Rash, Other (See Comments)        Medication List     STOP taking these medications    metolazone 2.5 MG tablet Commonly known as: ZAROXOLYN       TAKE these medications    acetaminophen 650 MG CR tablet Commonly known as: TYLENOL Take 1 tablet (650 mg total) by mouth every 8 (eight) hours as needed for pain.   albuterol 108 (90 Base) MCG/ACT inhaler Commonly known as: VENTOLIN HFA Inhale 2 puffs into the lungs every 6 (six) hours as needed for wheezing or shortness of breath.   allopurinol 100 MG tablet Commonly known as: ZYLOPRIM Take 1 tablet (100 mg total) by mouth daily for 6 days, THEN 1 tablet (100 mg total) 2 (two) times daily for 7 days, THEN 1.5 tablets (150 mg total) 2 (two) times daily for 7 days, THEN 2 tablets (200 mg total) 2 (two) times daily. Start taking on: July 12, 2023   apixaban 5 MG Tabs tablet Commonly known as: Eliquis Take 1 tablet (5 mg total) by mouth 2 (two) times daily.   ARIPiprazole 5 MG tablet Commonly known as: ABILIFY Take 1 tablet (5 mg total) by mouth at bedtime. What changed: when to take this   BD Pen Needle Nano U/F 32G X 4 MM Misc Generic drug: Insulin Pen Needle Use as directed   busPIRone 10 MG tablet Commonly known as: BUSPAR Take 1 tablet (10 mg total) by  mouth 3 (three) times daily.   colchicine 0.6 MG tablet Take 1 tablet (0.6 mg total) by mouth daily.   cyanocobalamin 1000 MCG tablet Commonly known as: VITAMIN B12 Take 1,000 mcg by mouth daily.   fenofibrate 145 MG tablet Commonly known as: Tricor Take 1 tablet (145 mg total) by mouth daily.   ferrous sulfate 324 (65 Fe) MG Tbec Take 324 mg by mouth daily with breakfast.   fluticasone 50 MCG/ACT nasal spray Commonly known as: FLONASE Place 2 sprays into both nostrils daily.   hydrocerin Crea Apply 1 Application topically 2 (two) times daily.   icosapent Ethyl 1 g capsule Commonly known as: Vascepa Take 2 capsules (2 g total) by mouth 2 (two) times daily.   Klor-Con M20 20 MEQ tablet Generic drug: potassium chloride SA TAKE 2 TABLETS (40 MEQ TOTAL) BY MOUTH 2 (TWO) TIMES DAILY. EXCEPT TUESDAYS WHEN YOU TAKE ( 3 TABS) What changed: See the new instructions.   Lacosamide 150 MG Tabs Take 1 tablet (150 mg total) by mouth 2 (two) times daily.   levETIRAcetam 500 MG tablet Commonly known as: KEPPRA Take 1 tablet (500 mg total) by mouth 2 (two) times daily.   methocarbamol  500 MG tablet Commonly known as: ROBAXIN Take 1 tablet (500 mg total) by mouth every 8 (eight) hours as needed for muscle spasms.   metoprolol succinate 25 MG 24 hr tablet Commonly known as: TOPROL-XL Take 1 tablet (25 mg total) by mouth daily.   Mounjaro 15 MG/0.5ML Pen Generic drug: tirzepatide INJECT 15 MG INTO THE SKIN ONCE A WEEK. What changed: See the new instructions.   Muscle Rub 10-15 % Crea Apply 1 Application topically as needed for muscle pain (to knees for pain).   NovoLOG FlexPen 100 UNIT/ML FlexPen Generic drug: insulin aspart INJECT 8 UNITS BEFORE MEALS, keep sugar TWO HOURS AFTER meals UNDER 180 AT least   omeprazole 20 MG capsule Commonly known as: PRILOSEC Take 20 mg by mouth every morning.   polyethylene glycol powder 17 GM/SCOOP powder Commonly known as:  GLYCOLAX/MIRALAX Mix in water as directed and take 1 capful (17 g) by mouth daily.   pregabalin 150 MG capsule Commonly known as: LYRICA Take 1 capsule (150 mg total) by mouth 3 (three) times daily.   rosuvastatin 40 MG tablet Commonly known as: CRESTOR Take 1 tablet (40 mg total) by mouth daily.   Senna-S 8.6-50 MG tablet Generic drug: senna-docusate Take 1 tablet by mouth at bedtime.   torsemide 20 MG tablet Commonly known as: DEMADEX Take 6 tablets (120 mg total) by mouth 2 (two) times daily.   traZODone 100 MG tablet Commonly known as: DESYREL Take 0.5 tablets (50 mg total) by mouth at bedtime.   Trelegy Ellipta 200-62.5-25 MCG/ACT Aepb Generic drug: Fluticasone-Umeclidin-Vilant INHALE 1 PUFF BY MOUTH INTO LUNGS ONCE DAILY   Tresiba FlexTouch 100 UNIT/ML FlexTouch Pen Generic drug: insulin degludec Inject 30 Units into the skin daily.   venlafaxine XR 75 MG 24 hr capsule Commonly known as: EFFEXOR-XR Take 3 capsules (225 mg total) by mouth daily with breakfast.   VITAMIN D3 PO Take 2,000 Units by mouth daily.        Follow-up Information     [facility] .   Specialty: Home Health Services Why: The home health agency will contact you for the next home visit. Contact information: [address] [place] [address] [phone]         [name], PA-C Follow up in 1 week(s).   Specialty: Family Medicine Contact information: [address] [place] [address] [phone]         Laurey Morale, MD. Go on 08/04/2023.   Specialty: Cardiology Contact information: [address] [place] [address] [place] [address] [phone]                Discharge Exam: Ceasar Mons Weights   07/14/23 0353  Weight: (!) 158.8 kg   General: NAD, morbidly obese Cardiovascular: S1, S2 present Respiratory: Diminished breath sounds bilaterally Abdomen: Soft, nontender, nondistended, bowel sounds  present Musculoskeletal: No bilateral pedal edema noted Skin: Normal Psychiatry: Normal mood   Condition at discharge: stable  The results of significant diagnostics from this hospitalization (including imaging, microbiology, ancillary and laboratory) are listed below for reference.   Imaging Studies: MR BRAIN WO CONTRAST  Result Date: 07/14/2023 CLINICAL DATA:  Acute neurologic deficit.  Left-sided weakness. EXAM: MRI HEAD WITHOUT CONTRAST TECHNIQUE: Multiplanar, multiecho pulse sequences of the brain and surrounding structures were obtained without intravenous contrast. COMPARISON:  06/25/2023 FINDINGS: Brain: No acute infarct, mass effect or extra-axial collection. No acute or chronic hemorrhage. Minimal multifocal hyperintense T2-weight signal within the white matter. The  midline structures are normal. Vascular: Normal flow voids. Skull and upper cervical spine: Normal calvarium and skull base. Visualized upper cervical spine and soft tissues are normal. Sinuses/Orbits:No paranasal sinus fluid levels or advanced mucosal thickening. No mastoid or middle ear effusion. Normal orbits. IMPRESSION: No acute intracranial abnormality. Minimal chronic small vessel disease. Electronically Signed   By: Deatra Robinson M.D.   On: 07/14/2023 22:38   EEG adult  Result Date: 07/14/2023 Charlsie Quest, MD     07/14/2023  4:55 PM Patient Name: Lawrence [name] MRN: [id number] Epilepsy Attending: Charlsie Quest Referring Physician/Provider: Erick Blinks, MD Date: 07/14/2023 Duration: 26.22 mins Patient history: 67yo M with ams getting eeg to evaluate for seizure Level of alertness: Awake, asleep AEDs during EEG study: LEV Technical aspects: This EEG study was done with scalp electrodes positioned according to the 10-20 International system of electrode placement. Electrical activity was reviewed with band pass filter of 1-70Hz , sensitivity of 7 uV/mm, display speed of 46mm/sec with a 60Hz  notched filter  applied as appropriate. EEG data were recorded continuously and digitally stored.  Video monitoring was available and reviewed as appropriate. Description: The posterior dominant rhythm consists of 7.5 Hz activity of moderate voltage (25-35 uV) seen predominantly in posterior head regions, symmetric and reactive to eye opening and eye closing. Sleep was characterized by vertex waves, sleep spindles (12 to 14 Hz), maximal frontocentral region. Physiologic photic driving was not seen during photic stimulation. Hyperventilation was not performed.   IMPRESSION: This study is within normal limits. No seizures or definite epileptiform discharges were seen throughout the recording. A normal interictal EEG does not exclude the diagnosis of epilepsy. Charlsie Quest   DG Chest Portable 1 View  Result Date: 07/14/2023 CLINICAL DATA:  67 year old male with shortness of breath. Code stroke, left side weakness. EXAM: PORTABLE CHEST 1 VIEW COMPARISON:  Portable chest 07/02/2023. CTA head and neck 0331 hours today. FINDINGS: Portable AP semi upright view at 0418 hours. Low lung volumes. Stable cardiac and mediastinal contours, mediastinal lipomatosis partially visible on CTA today. Visualized tracheal air column is within normal limits. Crowding of lung markings. No pneumothorax, pulmonary edema, pleural effusion or consolidation. IMPRESSION: Low lung volumes.  No other acute cardiopulmonary abnormality. Electronically Signed   By: Odessa Fleming M.D.   On: 07/14/2023 04:50   CT ANGIO HEAD NECK W WO CM (CODE STROKE)  Result Date: 07/14/2023 CLINICAL DATA:  Left-sided weakness EXAM: CT ANGIOGRAPHY HEAD AND NECK WITH AND WITHOUT CONTRAST TECHNIQUE: Multidetector CT imaging of the head and neck was performed using the standard protocol during bolus administration of intravenous contrast. Multiplanar CT image reconstructions and MIPs were obtained to evaluate the vascular anatomy. Carotid stenosis measurements (when applicable) are  obtained utilizing NASCET criteria, using the distal internal carotid diameter as the denominator. RADIATION DOSE REDUCTION: This exam was performed according to the departmental dose-optimization program which includes automated exposure control, adjustment of the mA and/or kV according to patient size and/or use of iterative reconstruction technique. CONTRAST:  75mL OMNIPAQUE IOHEXOL 350 MG/ML SOLN COMPARISON:  None Available. FINDINGS: CTA NECK FINDINGS SKELETON: No acute abnormality or high grade bony spinal canal stenosis. OTHER NECK: Debris throughout the cervical esophagus UPPER CHEST: No pneumothorax or pleural effusion. No nodules or masses. AORTIC ARCH: There is calcific atherosclerosis of the aortic arch. Normal variant aortic arch branching pattern with the brachiocephalic and left common carotid arteries sharing a common origin. RIGHT CAROTID SYSTEM: No dissection, occlusion or aneurysm. Mild atherosclerotic calcification  at the carotid bifurcation without hemodynamically significant stenosis. LEFT CAROTID SYSTEM: No dissection, occlusion or aneurysm. There is mixed density atherosclerosis extending into the proximal ICA, resulting in less than 50% stenosis. VERTEBRAL ARTERIES: Left dominant configuration. There is no dissection, occlusion or flow-limiting stenosis to the skull base (V1-V3 segments). CTA HEAD FINDINGS POSTERIOR CIRCULATION: Vertebral arteries are normal. No proximal occlusion of the anterior or inferior cerebellar arteries. Basilar artery is normal. Superior cerebellar arteries are normal. Posterior cerebral arteries are normal. ANTERIOR CIRCULATION: Intracranial internal carotid arteries are normal. Anterior cerebral arteries are normal. Middle cerebral arteries are normal. VENOUS SINUSES: As permitted by contrast timing, patent. ANATOMIC VARIANTS: None Review of the MIP images confirms the above findings. IMPRESSION: 1. No emergent large vessel occlusion or hemodynamically significant  stenosis of the head or neck. 2. Debris throughout the cervical esophagus, suggestive of gastroesophageal reflux. Aortic Atherosclerosis (ICD10-I70.0). Electronically Signed   By: Deatra Robinson M.D.   On: 07/14/2023 03:45   CT HEAD CODE STROKE WO CONTRAST  Result Date: 07/14/2023 CLINICAL DATA:  Code stroke.  Left-sided weakness EXAM: CT HEAD WITHOUT CONTRAST TECHNIQUE: Contiguous axial images were obtained from the base of the skull through the vertex without intravenous contrast. RADIATION DOSE REDUCTION: This exam was performed according to the departmental dose-optimization program which includes automated exposure control, adjustment of the mA and/or kV according to patient size and/or use of iterative reconstruction technique. COMPARISON:  None Available. FINDINGS: Brain: There is no mass, hemorrhage or extra-axial collection. The size and configuration of the ventricles and extra-axial CSF spaces are normal. The brain parenchyma is normal, without evidence of acute or chronic infarction. Vascular: No abnormal hyperdensity of the major intracranial arteries or dural venous sinuses. No intracranial atherosclerosis. Skull: The visualized skull base, calvarium and extracranial soft tissues are normal. Sinuses/Orbits: No fluid levels or advanced mucosal thickening of the visualized paranasal sinuses. No mastoid or middle ear effusion. The orbits are normal. ASPECTS [facility] Stroke Program Early CT Score) - Ganglionic level infarction (caudate, lentiform nuclei, internal capsule, insula, M1-M3 cortex): 7 - Supraganglionic infarction (M4-M6 cortex): 3 Total score (0-10 with 10 being normal): 10 IMPRESSION: 1. No acute intracranial abnormality. 2. ASPECTS is 10. These results were communicated to Dr. Erick Blinks at 3:35 am on 07/14/2023 by text page via the [facility] messaging system. Electronically Signed   By: Deatra Robinson M.D.   On: 07/14/2023 03:37   LONG TERM MONITOR-LIVE TELEMETRY (3-14 DAYS)  Result  Date: 07/12/2023   Predominant rhythm was normal sinus rhythm with average heart rate 81bpm (ranged from 64 to 96bpm)   Rave PACs and atrial triplets and 1 run of SVT for 4 beats at 169bpm   Frequent PVCs with PVC load 32.2%   Rare ventricular couplets, triplets and bigeminal and trigeminal PVCs. Patch Wear Time:  3 days and 0 hours (2024-11-22T11:10:22-0500 to 2024-11-25T11:15:29-0500) Patient had a min HR of 64 bpm, max HR of 169 bpm, and avg HR of 81 bpm. Predominant underlying rhythm was Sinus Rhythm. 1 run of Supraventricular Tachycardia occurred lasting 4 beats with a max rate of 169 bpm (avg 165 bpm). Isolated SVEs were rare (<1.0%), SVE Triplets were rare (<1.0%), and no SVE Couplets were present. Isolated VEs were frequent (32.2%, 83281), VE Couplets were rare (<1.0%, 983), and VE Triplets were rare (<1.0%, 110). Ventricular Bigeminy and Trigeminy were present.   DG CHEST PORT 1 VIEW  Result Date: 07/02/2023 CLINICAL DATA:  141880 SOB (shortness of breath) [id number] EXAM: PORTABLE CHEST 1  VIEW COMPARISON:  June 29, 2023 FINDINGS: The cardiomediastinal silhouette is unchanged and enlarged in contour. No pleural effusion. No pneumothorax. No acute pleuroparenchymal abnormality. IMPRESSION: No acute cardiopulmonary abnormality. Electronically Signed   By: Meda Klinefelter M.D.   On: 07/02/2023 16:23   DG Chest 2 View  Result Date: 06/29/2023 CLINICAL DATA:  Hypoxia. EXAM: CHEST - 2 VIEW COMPARISON:  Chest radiograph dated 06/24/2023. FINDINGS: Cardiomegaly with vascular congestion. No focal consolidation, pleural effusion, pneumothorax. Aortic valve repair. Degenerative changes of the spine. No acute osseous pathology. IMPRESSION: Cardiomegaly with vascular congestion.  No focal consolidation. Electronically Signed   By: Elgie Collard M.D.   On: 06/29/2023 18:57   ECHOCARDIOGRAM COMPLETE BUBBLE STUDY  Addendum Date: 06/26/2023   Status post 34 mm TAVR Medtronic evolute FX valve  (02/07/2023), at the aortic position, grossly well-seated, no perivalvular leak, no valvular regurgitation, either mild valvular stenosis versus prosthesis patient mismatch (peak velocity 2.7 m/s, peak gradient 29 mmHg, mean gradient 16 mmHg, dimensional index 0.44, EOA 1.11 cm, BSA 2.8 m2, acceleration time 82 ms. Clinical correlation required. [employer] Electronically Amended 06/26/2023, 5:34 PM   Final (Amended)    Result Date: 06/26/2023    ECHOCARDIOGRAM REPORT   Patient Name:   Lawrence [name] Date of Exam: 06/26/2023 Medical Rec #:  [id number]        Height:       74.0 in Accession #:    [id number]       Weight:       373.0 lb Date of Birth:  10-30-1955        BSA:          2.833 m Patient Age:    67 years         BP:           112/74 mmHg Patient Gender: M                HR:           79 bpm. Exam Location:  Inpatient Procedure: 2D Echo, Cardiac Doppler, Color Doppler and Saline Contrast Bubble            Study Indications:    Stroke I63.9  History:        Patient has prior history of Echocardiogram examinations, most                 recent 03/10/2023. CHF, CAD, Stroke and COPD, Arrythmias:Atrial                 Fibrillation; Risk Factors:Hypertension, Dyslipidemia and Former                 Smoker.  Sonographer:    Dondra Prader RVT RCS Referring Phys: [phone] DEBBY CROSLEY  Sonographer Comments: Technically difficult study due to poor echo windows, suboptimal apical window and suboptimal subcostal window. Image acquisition challenging due to COPD and Image acquisition challenging due to patient body habitus. IMPRESSIONS  1. Technically difficult study.  2. Left ventricular ejection fraction, by estimation, is 50 to 55%. The left ventricle has low normal function. Left ventricular endocardial border not optimally defined to evaluate regional wall motion. The left ventricular internal cavity size was dilated. Left ventricular diastolic function could not be evaluated.  3. Right ventricular systolic function  is normal. The right ventricular size is normal. Mildly increased right ventricular wall thickness.  4. There is no evidence of cardiac tamponade.  5. The mitral valve is grossly normal. No evidence of  mitral valve regurgitation. No evidence of mitral stenosis.  6. The aortic valve was not well visualized. Aortic valve regurgitation is not visualized. But based on doppler parameter cannot rule out mild (per max velocity and MG) to moderate aortic stenosis (dimensional index).  7. Aortic ascending aorta is structurally normal, with no evidence of dilitation.  8. Agitated saline contrast bubble study was inconclusive. Conclusion(s)/Recommendation(s): No intracardiac source of embolism detected on this transthoracic study. Consider a transesophageal echocardiogram to exclude cardiac source of embolism if clinically indicated. FINDINGS  Left Ventricle: Left ventricular ejection fraction, by estimation, is 50 to 55%. The left ventricle has low normal function. Left ventricular endocardial border not optimally defined to evaluate regional wall motion. The left ventricular internal cavity  size was dilated. There is borderline left ventricular hypertrophy. Left ventricular diastolic function could not be evaluated. Right Ventricle: The right ventricular size is normal. Mildly increased right ventricular wall thickness. Right ventricular systolic function is normal. Left Atrium: Left atrial size was normal in size. Right Atrium: Right atrial size was normal in size. Pericardium: Trivial pericardial effusion is present. There is no evidence of cardiac tamponade. Mitral Valve: The mitral valve is grossly normal. No evidence of mitral valve regurgitation. No evidence of mitral valve stenosis. Tricuspid Valve: The tricuspid valve is grossly normal. Tricuspid valve regurgitation is not demonstrated. No evidence of tricuspid stenosis. Aortic Valve: The aortic valve was not well visualized. Aortic valve regurgitation is not  visualized. But based on doppler parameter cannot rule out mild (per max velocity and MG) to moderate aortic stenosis (dimensional index). Aortic valve mean gradient measures 15.7 mmHg. Aortic valve peak gradient measures 29.4 mmHg. Aortic valve area, by VTI measures 1.11 cm. Pulmonic Valve: The pulmonic valve was not well visualized. Pulmonic valve regurgitation is not visualized. No evidence of pulmonic stenosis. Aorta: Aortic root could not be assessed and ascending aorta is structurally normal, with no evidence of dilitation. IAS/Shunts: The interatrial septum was not assessed. Agitated saline contrast was given intravenously to evaluate for intracardiac shunting. Agitated saline contrast bubble study was inconclusive.  LEFT VENTRICLE PLAX 2D LVIDd:         6.30 cm LVIDs:         4.60 cm LV PW:         1.30 cm LV IVS:        1.10 cm LVOT diam:     1.80 cm LV SV:         58 LV SV Index:   21 LVOT Area:     2.54 cm  RIGHT VENTRICLE             IVC RV S prime:     16.20 cm/s  IVC diam: 3.30 cm LEFT ATRIUM             Index        RIGHT ATRIUM           Index LA diam:        4.80 cm 1.69 cm/m   RA Area:     17.10 cm LA Vol (A2C):   62.1 ml 21.92 ml/m  RA Volume:   43.60 ml  15.39 ml/m LA Vol (A4C):   77.2 ml 27.25 ml/m LA Biplane Vol: 70.8 ml 24.99 ml/m  AORTIC VALVE                     PULMONIC VALVE AV Area (Vmax):    1.01 cm  PV Vmax:       1.23 m/s AV Area (Vmean):   1.09 cm      PV Peak grad:  6.1 mmHg AV Area (VTI):     1.11 cm AV Vmax:           271.33 cm/s AV Vmean:          181.000 cm/s AV VTI:            0.524 m AV Peak Grad:      29.4 mmHg AV Mean Grad:      15.7 mmHg LVOT Vmax:         107.42 cm/s LVOT Vmean:        77.260 cm/s LVOT VTI:          0.229 m LVOT/AV VTI ratio: 0.44  AORTA Ao Asc diam: 3.80 cm MITRAL VALVE MV Area (PHT): 4.39 cm    SHUNTS MV Decel Time: 173 msec    Systemic VTI:  0.23 m MV E velocity: 85.60 cm/s  Systemic Diam: 1.80 cm MV A velocity: 78.00 cm/s MV E/A ratio:   1.10 Sunit Tolia Electronically signed by Tessa Lerner Signature Date/Time: 06/26/2023/11:37:34 AM    Final (Amended)    MR BRAIN WO CONTRAST  Result Date: 06/25/2023 CLINICAL DATA:  Initial evaluation for neuro deficit, stroke suspected. EXAM: MRI HEAD WITHOUT CONTRAST TECHNIQUE: Multiplanar, multiecho pulse sequences of the brain and surrounding structures were obtained without intravenous contrast. COMPARISON:  Prior CTs from 06/24/2023. FINDINGS: Brain: Mild age-related cerebral volume loss. Few scattered subcentimeter foci of T2/FLAIR signal intensity seen involving the supratentorial cerebral white matter, most like related chronic microvascular ischemic disease, less than is typically seen for patient age. 5 mm focus of restricted diffusion seen involving the left cerebellum (series 5, image 57), consistent with a small acute ischemic nonhemorrhagic infarct. No other evidence for acute or subacute ischemia elsewhere. No acute or chronic intracranial blood products. No mass lesion, midline shift or mass effect. No hydrocephalus or extra-axial fluid collection. Pituitary gland suprasellar region within normal limits. Vascular: Major intracranial vascular flow voids are maintained. Skull and upper cervical spine: Craniocervical junction within normal limits. Bone marrow signal intensity normal. No scalp soft tissue abnormality. Sinuses/Orbits: Globes and orbital soft tissues within normal limits. Mild mucosal thickening present about the sphenoid ethmoidal sinuses. No mastoid effusion. Other: None. IMPRESSION: 1. 5 mm acute ischemic nonhemorrhagic left cerebellar infarct. 2. Underlying mild age-related cerebral volume loss with mild chronic microvascular ischemic disease. Electronically Signed   By: Rise Mu M.D.   On: 06/25/2023 01:14   DG Chest Portable 1 View  Result Date: 06/24/2023 CLINICAL DATA:  Altered level of consciousness EXAM: PORTABLE CHEST 1 VIEW COMPARISON:  05/15/2023  FINDINGS: Single frontal view of the chest demonstrates a stable cardiac silhouette. Chronic scarring at the lung bases. No acute airspace disease, effusion, or pneumothorax. No acute bony abnormalities. IMPRESSION: 1. Chronic bibasilar scarring.  No acute airspace disease. Electronically Signed   By: Sharlet Salina M.D.   On: 06/24/2023 22:52   CT ANGIO HEAD NECK W WO CM  Result Date: 06/24/2023 CLINICAL DATA:  Stroke/TIA. Determine embolic source. Left-sided facial droop. EXAM: CT ANGIOGRAPHY HEAD AND NECK WITH AND WITHOUT CONTRAST TECHNIQUE: Multidetector CT imaging of the head and neck was performed using the standard protocol during bolus administration of intravenous contrast. Multiplanar CT image reconstructions and MIPs were obtained to evaluate the vascular anatomy. Carotid stenosis measurements (when applicable) are obtained utilizing NASCET criteria, using the distal  internal carotid diameter as the denominator. RADIATION DOSE REDUCTION: This exam was performed according to the departmental dose-optimization program which includes automated exposure control, adjustment of the mA and/or kV according to patient size and/or use of iterative reconstruction technique. CONTRAST:  75mL OMNIPAQUE IOHEXOL 350 MG/ML SOLN COMPARISON:  CT head without contrast 06/24/2023. CT angio head and neck 05/07/2023. FINDINGS: CTA NECK FINDINGS Aortic arch: Atherosclerotic calcifications are present at the aortic arch without significant stenosis relative to the distal vessel. A scratched at the left common carotid artery shares a common origin with the innominate artery. Right carotid system: The right common carotid artery is within normal limits. Atherosclerotic calcifications are present at the bifurcation without significant stenosis. Mild tortuosity is present in the cervical left ICA. Left carotid system: The left common carotid artery is within normal limits. Atherosclerotic calcifications are present at the  bifurcation. No significant stenosis is present. Mild tortuosity is present the cervical left ICA. Vertebral arteries: The left vertebral artery is the dominant vessel. Both vertebral arteries originate from the subclavian arteries without significant stenosis. Significant stenosis is present either vertebral artery in. Skeleton: Neck level degenerative changes are again noted in the cervical spine. No focal osseous lesions are present. Other neck: Soft tissues the neck are otherwise unremarkable. Salivary glands are within normal limits. Thyroid is normal. No significant adenopathy is present. No focal mucosal or submucosal lesions are present. Upper chest: Mild dependent atelectasis is present in the lungs bilaterally. Review of the MIP images confirms the above findings CTA HEAD FINDINGS Anterior circulation: The internal carotid arteries are within normal limits from the skull base to the ICA termini. The A1 and M1 segments are normal. The anterior communicating artery is patent. MCA bifurcations are normal. The ACA and MCA branch vessels are within normal limits. No aneurysm is present. Posterior circulation: Left vertebral artery is dominant vessel. The PICA origins are visualized and normal. The vertebrobasilar junction and basilar artery are normal. The superior cerebellar arteries are patent. Both posterior cerebral arteries originate from basilar tip. PCA branch vessels are normal bilaterally. Venous sinuses: The dural sinuses are patent. The straight sinus and deep cerebral veins are intact. Cortical veins are within normal limits. No significant vascular malformation is evident. Anatomic variants: None Review of the MIP images confirms the above findings IMPRESSION: 1. No emergent large vessel occlusion. 2. Atherosclerotic changes at the carotid bifurcations bilaterally without significant stenosis. 3. Mild tortuosity of the cervical internal carotid arteries bilaterally likely reflects chronic  hypertension. 4. Degenerative changes in the cervical spine. 5.  Aortic Atherosclerosis (ICD10-I70.0). Electronically Signed   By: Marin Roberts M.D.   On: 06/24/2023 21:33   CT HEAD CODE STROKE WO CONTRAST  Result Date: 06/24/2023 CLINICAL DATA:  Code stroke.  Initial evaluation for acute stroke. EXAM: CT HEAD WITHOUT CONTRAST TECHNIQUE: Contiguous axial images were obtained from the base of the skull through the vertex without intravenous contrast. RADIATION DOSE REDUCTION: This exam was performed according to the departmental dose-optimization program which includes automated exposure control, adjustment of the mA and/or kV according to patient size and/or use of iterative reconstruction technique. COMPARISON:  Prior study from 05/07/2023 FINDINGS: Brain: Cerebral volume within normal limits. Mild chronic microvascular ischemic disease. No acute intracranial hemorrhage. No acute large vessel territory infarct. No mass lesion or midline shift. No hydrocephalus or extra-axial fluid collection. Vascular: No abnormal hyperdense vessel. Skull: Scalp soft tissues within normal limits.  Calvarium intact. Sinuses/Orbits: Globes and orbital soft tissues within normal limits. Mild  mucosal thickening about the ethmoidal air cells. Paranasal sinuses are otherwise clear. No significant mastoid effusion. Other: None. ASPECTS [facility] Stroke Program Early CT Score) - Ganglionic level infarction (caudate, lentiform nuclei, internal capsule, insula, M1-M3 cortex): 7 - Supraganglionic infarction (M4-M6 cortex): 3 Total score (0-10 with 10 being normal): 10 IMPRESSION: 1. No acute intracranial abnormality. 2. Aspects is 10. 3. Mild chronic microvascular ischemic disease. These results were communicated to Dr. Wilford Corner at 9:14 pm on 06/24/2023 by text page via the [facility] messaging system. Electronically Signed   By: Rise Mu M.D.   On: 06/24/2023 21:15    Microbiology: Results for orders placed or performed  during the hospital encounter of 01/22/23  Respiratory (~20 pathogens) panel by PCR     Status: None   Collection Time: 01/23/23  8:35 AM   Specimen: Nasopharyngeal Swab; Respiratory  Result Value Ref Range Status   Adenovirus NOT DETECTED NOT DETECTED Final   Coronavirus 229E NOT DETECTED NOT DETECTED Final    Comment: (NOTE) The Coronavirus on the Respiratory Panel, DOES NOT test for the novel  Coronavirus (2019 nCoV)    Coronavirus HKU1 NOT DETECTED NOT DETECTED Final   Coronavirus NL63 NOT DETECTED NOT DETECTED Final   Coronavirus OC43 NOT DETECTED NOT DETECTED Final   Metapneumovirus NOT DETECTED NOT DETECTED Final   Rhinovirus / Enterovirus NOT DETECTED NOT DETECTED Final   Influenza A NOT DETECTED NOT DETECTED Final   Influenza B NOT DETECTED NOT DETECTED Final   Parainfluenza Virus 1 NOT DETECTED NOT DETECTED Final   Parainfluenza Virus 2 NOT DETECTED NOT DETECTED Final   Parainfluenza Virus 3 NOT DETECTED NOT DETECTED Final   Parainfluenza Virus 4 NOT DETECTED NOT DETECTED Final   Respiratory Syncytial Virus NOT DETECTED NOT DETECTED Final   Bordetella pertussis NOT DETECTED NOT DETECTED Final   Bordetella Parapertussis NOT DETECTED NOT DETECTED Final   Chlamydophila pneumoniae NOT DETECTED NOT DETECTED Final   Mycoplasma pneumoniae NOT DETECTED NOT DETECTED Final    Comment: Performed at [facility], [address], [place], [place] [address]  SARS Coronavirus 2 by RT PCR (hospital order, performed in [facility] hospital lab) *cepheid single result test* Anterior Nasal Swab     Status: None   Collection Time: 01/23/23  8:35 AM   Specimen: Anterior Nasal Swab  Result Value Ref Range Status   SARS Coronavirus 2 by RT PCR NEGATIVE NEGATIVE Final    Comment: Performed at [facility], [address] [place], [place] [address]  Surgical PCR screen     Status: Abnormal   Collection Time: 02/03/23  2:00 AM   Specimen: Nasal Mucosa; Nasal Swab  Result Value Ref  Range Status   MRSA, PCR NEGATIVE NEGATIVE Final   Staphylococcus aureus POSITIVE (A) NEGATIVE Final    Comment: (NOTE) The Xpert SA Assay (FDA approved for NASAL specimens in patients 14 years of age and older), is one component of a comprehensive surveillance program. It is not intended to diagnose infection nor to guide or monitor treatment. Performed at [facility], [address] [place], [place] [address]     Labs: CBC: Recent Labs  Lab 07/11/23 0453 07/14/23 0314 07/14/23 0316 07/14/23 0601 07/15/23 0434 07/16/23 0634  WBC 10.2 10.5  --   --  9.0 7.2  NEUTROABS  --  6.7  --   --   --   --   HGB 13.9 14.7 16.0 15.0 13.6 14.4  HCT 43.5 46.6  47.0 44.0 42.3 44.0  MCV 89.9 90.8  --   --  90.4 90.3  PLT 246 240  --   --  243 195   Basic Metabolic Panel: Recent Labs  Lab 07/11/23 0453 07/14/23 0314 07/14/23 0316 07/14/23 0601 07/15/23 0434 07/16/23 0634 07/17/23 0623  NA 140 140 142 142 139 134* 139  K 3.7 4.0 4.1 3.5 3.6 3.5 3.7  CL 104 102 105  --  105 105 105  CO2 27 24  --   --  27 22 27   GLUCOSE 109* 122* 119*  --  141* 101* 139*  BUN 18 29* 35*  --  20 15 11   CREATININE 1.10 1.86* 2.00*  --  1.07 0.85 0.91  CALCIUM 8.9 9.6  --   --  8.7* 8.8* 9.2   Liver Function Tests: Recent Labs  Lab 07/14/23 0314  AST 21  ALT 24  ALKPHOS 64  BILITOT 0.6  PROT 7.3  ALBUMIN 4.0   CBG: Recent Labs  Lab 07/16/23 1608 07/16/23 2116 07/17/23 0626 07/17/23 0820 07/17/23 1226  GLUCAP 128* 121* 144* 151* 213*    Discharge time spent: greater than 30 minutes.  Signed: Briant Cedar, MD [facility] 07/17/2023

## 2023-07-17 NOTE — Consult Note (Signed)
[facility] Liaison Consult Note    07/17/2023  TANNOR PRETORIUS 1956-06-02 [id number]  Insurance: Francine Graven Medicare will transfer to [facility]   Primary Care Provider: [name], PA-C with Deboraha Sprang at [facility], this provider is listed for the transition of care follow up appointments  and [facility] Transition team calls   [facility] Liaison met patient at the bedside up in bedside chair. Introduce self and reason for bedside rounding visit. Patient endorses PCP. The patient was screened for 7 day readmission hospitalization with noted extreme risk score for unplanned readmission risk 5 hospital admissions in 6 months.  The patient was assessed for potential Community Care Coordination service needs for post hospital transition for care coordination. Review of patient's electronic medical record reveals patient is for home with [facility] today. Met with patient at the bedside. Patient endorses ongoing follow up with [place].  Plan: [facility] Liaison will send update to [facility] transition team.  Referral request for community care coordination: anticipate follow up from [facility] Transition team.     [facility], [facility] does not replace or interfere with any arrangements made by the Inpatient Transition of Care team.   For questions contact:   [name], RN, BSN, CCM [place]  [facility], [facility] Liaison Direct Dial: [phone] or secure chat Email: [email or web address]

## 2023-07-17 NOTE — TOC Transition Note (Signed)
Transition of Care [facility]) - CM/SW Discharge Note   Patient Details  Name: KEYONTAY WAMBACH MRN: [id number] Date of Birth: 1955-09-21  Transition of [facility] CM/SW Contact:  Kermit Balo, RN Phone Number: 07/17/2023, 11:12 AM   Clinical Narrative:     Pt is discharging home with his spouse. She is with him most of the time.  Pt is active with [facility] for home health services and asked to continue with them. Kelly with [facility] aware of resumption orders. Information on the AVS.  DME at home: rollator/ hospital bed/ BSC/ CPAP/ lift chair/ electric scooter Pt uses [place] access for transportation. Pt needs ambulance transport home.   Final next level of care: Home w Home Health Services Barriers to Discharge: No Barriers Identified   Patient Goals and CMS Choice CMS Medicare.gov Compare Post Acute Care list provided to:: Patient Choice offered to / list presented to : Patient  Discharge Placement                         Discharge Plan and Services Additional resources added to the After Visit Summary for                            HH Arranged: RN, PT, OT [facility] Agency: [facility] Date [facility] Agency Contacted: 07/17/23   Representative spoke with at [facility] Agency: Tresa Endo  Social Determinants of Health (SDOH) Interventions SDOH Screenings   Food Insecurity: No Food Insecurity (07/16/2023)  Recent Concern: Food Insecurity - Food Insecurity Present (06/25/2023)  Housing: Low Risk  (07/16/2023)  Transportation Needs: No Transportation Needs (07/16/2023)  Utilities: Not At Risk (07/16/2023)  Alcohol Screen: Low Risk  (06/08/2021)  Financial Resource Strain: Medium Risk (12/06/2021)   Received from [facility], [facility]  Physical Activity: Unknown (12/06/2021)   Received from [facility], [facility]  Social Connections: Unknown (12/18/2022)   Received from [facility], [facility]  Stress: No Stress Concern Present (01/21/2022)   Received  from [facility], [facility]  Recent Concern: Stress - Stress Concern Present (11/19/2021)   Received from [facility]  Tobacco Use: Medium Risk (07/14/2023)     Readmission Risk Interventions    05/08/2023    4:37 PM 01/25/2023    4:23 PM 08/12/2021   10:14 AM  Readmission Risk Prevention Plan  Transportation Screening Complete Complete Complete  PCP or Specialist Appt within 3-5 Days  Complete   HRI or Home Care Consult  Complete   Social Work Consult for Recovery Care Planning/Counseling  Complete   Palliative Care Screening  Not Applicable   Medication Review [facility]) Complete Referral to Pharmacy Complete  PCP or Specialist appointment within 3-5 days of discharge Complete  Complete  HRI or Home Care Consult Complete  Complete  SW Recovery Care/Counseling Consult Complete  Complete  Palliative Care Screening Complete  Not Applicable  Skilled Nursing Facility Not Applicable  Not Applicable

## 2023-07-21 ENCOUNTER — Emergency Department (HOSPITAL_COMMUNITY): Payer: Medicare HMO

## 2023-07-21 ENCOUNTER — Inpatient Hospital Stay (HOSPITAL_COMMUNITY)
Admission: EM | Admit: 2023-07-21 | Discharge: 2023-07-25 | DRG: 683 | Disposition: A | Discharge status: Skilled Nursing Facility | Payer: Medicare HMO | Attending: Internal Medicine | Admitting: Internal Medicine

## 2023-07-21 ENCOUNTER — Other Ambulatory Visit: Payer: Self-pay

## 2023-07-21 ENCOUNTER — Encounter (HOSPITAL_COMMUNITY): Payer: Self-pay | Admitting: *Deleted

## 2023-07-21 DIAGNOSIS — I69322 Dysarthria following cerebral infarction: Secondary | ICD-10-CM | POA: Diagnosis not present

## 2023-07-21 DIAGNOSIS — R296 Repeated falls: Secondary | ICD-10-CM | POA: Diagnosis not present

## 2023-07-21 DIAGNOSIS — I4821 Permanent atrial fibrillation: Secondary | ICD-10-CM | POA: Diagnosis present

## 2023-07-21 DIAGNOSIS — G459 Transient cerebral ischemic attack, unspecified: Secondary | ICD-10-CM | POA: Diagnosis not present

## 2023-07-21 DIAGNOSIS — F331 Major depressive disorder, recurrent, moderate: Secondary | ICD-10-CM

## 2023-07-21 DIAGNOSIS — E1122 Type 2 diabetes mellitus with diabetic chronic kidney disease: Secondary | ICD-10-CM | POA: Diagnosis not present

## 2023-07-21 DIAGNOSIS — I5030 Unspecified diastolic (congestive) heart failure: Secondary | ICD-10-CM | POA: Diagnosis not present

## 2023-07-21 DIAGNOSIS — I5033 Acute on chronic diastolic (congestive) heart failure: Secondary | ICD-10-CM | POA: Diagnosis present

## 2023-07-21 DIAGNOSIS — F4310 Post-traumatic stress disorder, unspecified: Secondary | ICD-10-CM | POA: Diagnosis present

## 2023-07-21 DIAGNOSIS — E538 Deficiency of other specified B group vitamins: Secondary | ICD-10-CM | POA: Diagnosis present

## 2023-07-21 DIAGNOSIS — K219 Gastro-esophageal reflux disease without esophagitis: Secondary | ICD-10-CM | POA: Diagnosis present

## 2023-07-21 DIAGNOSIS — G9341 Metabolic encephalopathy: Secondary | ICD-10-CM | POA: Diagnosis present

## 2023-07-21 DIAGNOSIS — F419 Anxiety disorder, unspecified: Secondary | ICD-10-CM | POA: Diagnosis present

## 2023-07-21 DIAGNOSIS — N400 Enlarged prostate without lower urinary tract symptoms: Secondary | ICD-10-CM | POA: Diagnosis present

## 2023-07-21 DIAGNOSIS — J449 Chronic obstructive pulmonary disease, unspecified: Secondary | ICD-10-CM | POA: Diagnosis not present

## 2023-07-21 DIAGNOSIS — Z66 Do not resuscitate: Secondary | ICD-10-CM | POA: Diagnosis present

## 2023-07-21 DIAGNOSIS — I639 Cerebral infarction, unspecified: Secondary | ICD-10-CM | POA: Diagnosis not present

## 2023-07-21 DIAGNOSIS — E7800 Pure hypercholesterolemia, unspecified: Secondary | ICD-10-CM | POA: Diagnosis present

## 2023-07-21 DIAGNOSIS — R262 Difficulty in walking, not elsewhere classified: Secondary | ICD-10-CM | POA: Diagnosis not present

## 2023-07-21 DIAGNOSIS — Z794 Long term (current) use of insulin: Secondary | ICD-10-CM | POA: Diagnosis not present

## 2023-07-21 DIAGNOSIS — G4733 Obstructive sleep apnea (adult) (pediatric): Secondary | ICD-10-CM | POA: Diagnosis present

## 2023-07-21 DIAGNOSIS — E1142 Type 2 diabetes mellitus with diabetic polyneuropathy: Secondary | ICD-10-CM | POA: Diagnosis not present

## 2023-07-21 DIAGNOSIS — Z79899 Other long term (current) drug therapy: Secondary | ICD-10-CM

## 2023-07-21 DIAGNOSIS — G40909 Epilepsy, unspecified, not intractable, without status epilepticus: Secondary | ICD-10-CM | POA: Diagnosis present

## 2023-07-21 DIAGNOSIS — R5381 Other malaise: Secondary | ICD-10-CM | POA: Diagnosis not present

## 2023-07-21 DIAGNOSIS — R531 Weakness: Secondary | ICD-10-CM

## 2023-07-21 DIAGNOSIS — R0989 Other specified symptoms and signs involving the circulatory and respiratory systems: Secondary | ICD-10-CM | POA: Diagnosis not present

## 2023-07-21 DIAGNOSIS — Z953 Presence of xenogenic heart valve: Secondary | ICD-10-CM

## 2023-07-21 DIAGNOSIS — E876 Hypokalemia: Secondary | ICD-10-CM | POA: Diagnosis present

## 2023-07-21 DIAGNOSIS — I130 Hypertensive heart and chronic kidney disease with heart failure and stage 1 through stage 4 chronic kidney disease, or unspecified chronic kidney disease: Secondary | ICD-10-CM | POA: Diagnosis not present

## 2023-07-21 DIAGNOSIS — N179 Acute kidney failure, unspecified: Principal | ICD-10-CM | POA: Diagnosis present

## 2023-07-21 DIAGNOSIS — F32A Depression, unspecified: Secondary | ICD-10-CM | POA: Diagnosis present

## 2023-07-21 DIAGNOSIS — I499 Cardiac arrhythmia, unspecified: Secondary | ICD-10-CM | POA: Diagnosis not present

## 2023-07-21 DIAGNOSIS — D509 Iron deficiency anemia, unspecified: Secondary | ICD-10-CM | POA: Diagnosis not present

## 2023-07-21 DIAGNOSIS — E1169 Type 2 diabetes mellitus with other specified complication: Secondary | ICD-10-CM | POA: Diagnosis not present

## 2023-07-21 DIAGNOSIS — I878 Other specified disorders of veins: Secondary | ICD-10-CM | POA: Diagnosis present

## 2023-07-21 DIAGNOSIS — I482 Chronic atrial fibrillation, unspecified: Secondary | ICD-10-CM | POA: Diagnosis present

## 2023-07-21 DIAGNOSIS — I69811 Memory deficit following other cerebrovascular disease: Secondary | ICD-10-CM | POA: Diagnosis not present

## 2023-07-21 DIAGNOSIS — Z881 Allergy status to other antibiotic agents status: Secondary | ICD-10-CM

## 2023-07-21 DIAGNOSIS — Z833 Family history of diabetes mellitus: Secondary | ICD-10-CM

## 2023-07-21 DIAGNOSIS — I63542 Cerebral infarction due to unspecified occlusion or stenosis of left cerebellar artery: Secondary | ICD-10-CM | POA: Diagnosis not present

## 2023-07-21 DIAGNOSIS — N1831 Chronic kidney disease, stage 3a: Secondary | ICD-10-CM | POA: Diagnosis present

## 2023-07-21 DIAGNOSIS — R404 Transient alteration of awareness: Secondary | ICD-10-CM | POA: Diagnosis not present

## 2023-07-21 DIAGNOSIS — Z8249 Family history of ischemic heart disease and other diseases of the circulatory system: Secondary | ICD-10-CM

## 2023-07-21 DIAGNOSIS — D631 Anemia in chronic kidney disease: Secondary | ICD-10-CM | POA: Diagnosis not present

## 2023-07-21 DIAGNOSIS — Z7901 Long term (current) use of anticoagulants: Secondary | ICD-10-CM

## 2023-07-21 DIAGNOSIS — W19XXXA Unspecified fall, initial encounter: Secondary | ICD-10-CM | POA: Diagnosis not present

## 2023-07-21 DIAGNOSIS — E860 Dehydration: Secondary | ICD-10-CM | POA: Diagnosis not present

## 2023-07-21 DIAGNOSIS — I350 Nonrheumatic aortic (valve) stenosis: Secondary | ICD-10-CM | POA: Diagnosis present

## 2023-07-21 DIAGNOSIS — Z860101 Personal history of adenomatous and serrated colon polyps: Secondary | ICD-10-CM

## 2023-07-21 DIAGNOSIS — R0902 Hypoxemia: Secondary | ICD-10-CM | POA: Diagnosis not present

## 2023-07-21 DIAGNOSIS — J4489 Other specified chronic obstructive pulmonary disease: Secondary | ICD-10-CM | POA: Diagnosis present

## 2023-07-21 DIAGNOSIS — G2581 Restless legs syndrome: Secondary | ICD-10-CM | POA: Diagnosis present

## 2023-07-21 DIAGNOSIS — Z7985 Long-term (current) use of injectable non-insulin antidiabetic drugs: Secondary | ICD-10-CM

## 2023-07-21 DIAGNOSIS — I4891 Unspecified atrial fibrillation: Secondary | ICD-10-CM | POA: Diagnosis not present

## 2023-07-21 DIAGNOSIS — Z6841 Body Mass Index (BMI) 40.0 and over, adult: Secondary | ICD-10-CM | POA: Diagnosis not present

## 2023-07-21 DIAGNOSIS — I5032 Chronic diastolic (congestive) heart failure: Secondary | ICD-10-CM | POA: Diagnosis not present

## 2023-07-21 DIAGNOSIS — Z87891 Personal history of nicotine dependence: Secondary | ICD-10-CM

## 2023-07-21 DIAGNOSIS — F411 Generalized anxiety disorder: Secondary | ICD-10-CM

## 2023-07-21 DIAGNOSIS — Z96652 Presence of left artificial knee joint: Secondary | ICD-10-CM | POA: Diagnosis present

## 2023-07-21 DIAGNOSIS — I2510 Atherosclerotic heart disease of native coronary artery without angina pectoris: Secondary | ICD-10-CM | POA: Diagnosis present

## 2023-07-21 DIAGNOSIS — E869 Volume depletion, unspecified: Secondary | ICD-10-CM | POA: Diagnosis present

## 2023-07-21 DIAGNOSIS — Z7401 Bed confinement status: Secondary | ICD-10-CM | POA: Diagnosis not present

## 2023-07-21 DIAGNOSIS — Z8673 Personal history of transient ischemic attack (TIA), and cerebral infarction without residual deficits: Secondary | ICD-10-CM

## 2023-07-21 DIAGNOSIS — I480 Paroxysmal atrial fibrillation: Secondary | ICD-10-CM | POA: Diagnosis present

## 2023-07-21 DIAGNOSIS — M6281 Muscle weakness (generalized): Secondary | ICD-10-CM | POA: Diagnosis not present

## 2023-07-21 DIAGNOSIS — I959 Hypotension, unspecified: Secondary | ICD-10-CM | POA: Diagnosis not present

## 2023-07-21 DIAGNOSIS — E66813 Obesity, class 3: Secondary | ICD-10-CM | POA: Diagnosis present

## 2023-07-21 LAB — CK: Total CK: 63 U/L (ref 49–397)

## 2023-07-21 LAB — CBC WITH DIFFERENTIAL/PLATELET
Abs Immature Granulocytes: 0.04 10*3/uL (ref 0.00–0.07)
Basophils Absolute: 0.1 10*3/uL (ref 0.0–0.1)
Basophils Relative: 1 %
Eosinophils Absolute: 0.4 10*3/uL (ref 0.0–0.5)
Eosinophils Relative: 3 %
HCT: 46.4 % (ref 39.0–52.0)
Hemoglobin: 14.9 g/dL (ref 13.0–17.0)
Immature Granulocytes: 0 %
Lymphocytes Relative: 11 %
Lymphs Abs: 1.4 10*3/uL (ref 0.7–4.0)
MCH: 29 pg (ref 26.0–34.0)
MCHC: 32.1 g/dL (ref 30.0–36.0)
MCV: 90.3 fL (ref 80.0–100.0)
Monocytes Absolute: 0.7 10*3/uL (ref 0.1–1.0)
Monocytes Relative: 6 %
Neutro Abs: 10.2 10*3/uL — ABNORMAL HIGH (ref 1.7–7.7)
Neutrophils Relative %: 79 %
Platelets: 226 10*3/uL (ref 150–400)
RBC: 5.14 MIL/uL (ref 4.22–5.81)
RDW: 15 % (ref 11.5–15.5)
WBC: 12.8 10*3/uL — ABNORMAL HIGH (ref 4.0–10.5)
nRBC: 0 % (ref 0.0–0.2)

## 2023-07-21 LAB — URINALYSIS, ROUTINE W REFLEX MICROSCOPIC
Bilirubin Urine: NEGATIVE
Glucose, UA: 50 mg/dL — AB
Hgb urine dipstick: NEGATIVE
Ketones, ur: NEGATIVE mg/dL
Leukocytes,Ua: NEGATIVE
Nitrite: NEGATIVE
Protein, ur: NEGATIVE mg/dL
Specific Gravity, Urine: 1.009 (ref 1.005–1.030)
pH: 5 (ref 5.0–8.0)

## 2023-07-21 LAB — COMPREHENSIVE METABOLIC PANEL
ALT: 33 U/L (ref 0–44)
AST: 22 U/L (ref 15–41)
Albumin: 3.9 g/dL (ref 3.5–5.0)
Alkaline Phosphatase: 71 U/L (ref 38–126)
Anion gap: 13 (ref 5–15)
BUN: 24 mg/dL — ABNORMAL HIGH (ref 8–23)
CO2: 29 mmol/L (ref 22–32)
Calcium: 9.2 mg/dL (ref 8.9–10.3)
Chloride: 100 mmol/L (ref 98–111)
Creatinine, Ser: 1.46 mg/dL — ABNORMAL HIGH (ref 0.61–1.24)
GFR, Estimated: 53 mL/min — ABNORMAL LOW (ref >60)
Glucose, Bld: 105 mg/dL — ABNORMAL HIGH (ref 70–99)
Potassium: 3.4 mmol/L — ABNORMAL LOW (ref 3.5–5.1)
Sodium: 142 mmol/L (ref 135–145)
Total Bilirubin: 0.7 mg/dL (ref <1.2)
Total Protein: 7.1 g/dL (ref 6.5–8.1)

## 2023-07-21 LAB — I-STAT CHEM 8, ED
BUN: 26 mg/dL — ABNORMAL HIGH (ref 8–23)
Calcium, Ion: 1.14 mmol/L — ABNORMAL LOW (ref 1.15–1.40)
Chloride: 100 mmol/L (ref 98–111)
Creatinine, Ser: 1.6 mg/dL — ABNORMAL HIGH (ref 0.61–1.24)
Glucose, Bld: 103 mg/dL — ABNORMAL HIGH (ref 70–99)
HCT: 45 % (ref 39.0–52.0)
Hemoglobin: 15.3 g/dL (ref 13.0–17.0)
Potassium: 3.2 mmol/L — ABNORMAL LOW (ref 3.5–5.1)
Sodium: 144 mmol/L (ref 135–145)
TCO2: 33 mmol/L — ABNORMAL HIGH (ref 22–32)

## 2023-07-21 LAB — TROPONIN I (HIGH SENSITIVITY): Troponin I (High Sensitivity): 5 ng/L (ref <18)

## 2023-07-21 MED ORDER — ACETAMINOPHEN 500 MG PO TABS
1000.0000 mg | ORAL_TABLET | Freq: Once | ORAL | Status: AC
  Administered 2023-07-21: 1000 mg via ORAL
  Filled 2023-07-21: qty 2

## 2023-07-21 MED ORDER — LEVETIRACETAM 500 MG PO TABS
500.0000 mg | ORAL_TABLET | Freq: Two times a day (BID) | ORAL | Status: DC
  Administered 2023-07-22 – 2023-07-25 (×7): 500 mg via ORAL
  Filled 2023-07-21 (×7): qty 1

## 2023-07-21 MED ORDER — PROCHLORPERAZINE EDISYLATE 10 MG/2ML IJ SOLN: 5.0000 mg | Freq: Four times a day (QID) | INTRAMUSCULAR | Status: DC | PRN

## 2023-07-21 MED ORDER — PANTOPRAZOLE SODIUM 40 MG PO TBEC
40.0000 mg | DELAYED_RELEASE_TABLET | Freq: Every day | ORAL | Status: DC
  Administered 2023-07-22 – 2023-07-25 (×4): 40 mg via ORAL
  Filled 2023-07-21 (×4): qty 1

## 2023-07-21 MED ORDER — POLYETHYLENE GLYCOL 3350 17 G PO PACK: 17.0000 g | PACK | Freq: Every day | ORAL | Status: DC | PRN

## 2023-07-21 MED ORDER — ACETAMINOPHEN 325 MG PO TABS
650.0000 mg | ORAL_TABLET | Freq: Four times a day (QID) | ORAL | Status: DC | PRN
  Administered 2023-07-23 – 2023-07-24 (×3): 650 mg via ORAL
  Filled 2023-07-21 (×4): qty 2

## 2023-07-21 MED ORDER — SODIUM CHLORIDE 0.9 % IV SOLN: INTRAVENOUS | Status: DC

## 2023-07-21 MED ORDER — LACOSAMIDE 50 MG PO TABS: 150.0000 mg | ORAL_TABLET | Freq: Two times a day (BID) | ORAL | Status: DC

## 2023-07-21 MED ORDER — ROSUVASTATIN CALCIUM 20 MG PO TABS
40.0000 mg | ORAL_TABLET | Freq: Every day | ORAL | Status: DC
  Administered 2023-07-22 – 2023-07-25 (×4): 40 mg via ORAL
  Filled 2023-07-21 (×4): qty 2

## 2023-07-21 MED ORDER — POTASSIUM CHLORIDE CRYS ER 20 MEQ PO TBCR
20.0000 meq | EXTENDED_RELEASE_TABLET | Freq: Two times a day (BID) | ORAL | Status: AC
  Administered 2023-07-22 (×2): 20 meq via ORAL
  Filled 2023-07-21 (×2): qty 1

## 2023-07-21 MED ORDER — LACOSAMIDE 50 MG PO TABS
150.0000 mg | ORAL_TABLET | Freq: Two times a day (BID) | ORAL | Status: DC
  Administered 2023-07-22 – 2023-07-25 (×7): 150 mg via ORAL
  Filled 2023-07-21 (×7): qty 3

## 2023-07-21 MED ORDER — FENOFIBRATE 54 MG PO TABS
54.0000 mg | ORAL_TABLET | Freq: Every day | ORAL | Status: DC
  Administered 2023-07-23 – 2023-07-25 (×3): 54 mg via ORAL
  Filled 2023-07-21 (×4): qty 1

## 2023-07-21 MED ORDER — ARIPIPRAZOLE 5 MG PO TABS
5.0000 mg | ORAL_TABLET | Freq: Every day | ORAL | Status: DC
  Administered 2023-07-22 – 2023-07-25 (×4): 5 mg via ORAL
  Filled 2023-07-21 (×4): qty 1

## 2023-07-21 MED ORDER — METHOCARBAMOL 500 MG PO TABS
500.0000 mg | ORAL_TABLET | Freq: Once | ORAL | Status: AC
  Administered 2023-07-21: 500 mg via ORAL
  Filled 2023-07-21: qty 1

## 2023-07-21 MED ORDER — MELATONIN 5 MG PO TABS
5.0000 mg | ORAL_TABLET | Freq: Every evening | ORAL | Status: DC | PRN
  Administered 2023-07-24 (×2): 5 mg via ORAL
  Filled 2023-07-21 (×2): qty 1

## 2023-07-21 MED ORDER — ICOSAPENT ETHYL 1 G PO CAPS
2.0000 g | ORAL_CAPSULE | Freq: Two times a day (BID) | ORAL | Status: DC
Start: 2023-07-22 — End: 2023-07-25
  Administered 2023-07-22 – 2023-07-25 (×6): 2 g via ORAL
  Filled 2023-07-21 (×8): qty 2

## 2023-07-21 NOTE — ED Notes (Signed)
ED Provider at bedside. 

## 2023-07-21 NOTE — ED Notes (Signed)
ED TO INPATIENT HANDOFF REPORT  ED Nurse Name and Phone #: Trinna Post, RN 39  S Name/Age/Gender Lawrence [name] 67 y.o. male Room/Bed: 045C/045C  Code Status   Code Status: Limited: Do not attempt resuscitation (DNR) -DNR-LIMITED -Do Not Intubate/DNI   Home/SNF/Other Home Patient oriented to: self, place, time, and situation Is this baseline? Yes   Triage Complete: Triage complete  Chief Complaint Multiple falls [R29.6]  Triage Note Pt arrives via [facility]. Per their report, pt is coming from home (lives with wife)  had multiple falls this week (2 today) weakness Trying to get back into powered recliner, sitting on edge and slid out onto the floor. Did not hit his head. No pain, no injury. No LOC. On eliquis. Issues ambulating since d/c from hospital admission, due to start physical therapy next week. Vitals 160/68, hr 70's, afib, bigeminy., cbg 157.   Allergies Allergies  Allergen Reactions   Vancomycin Other (See Comments)    "Red Man Syndrome" 02/02/17: possible cause for rash under both arms   Niacin And Related Other (See Comments)    Red man syndrome   Tubersol [Tuberculin, Ppd] Other (See Comments)    Reaction unknown   Doxycycline Rash and Other (See Comments)    Level of Care/Admitting Diagnosis ED Disposition     ED Disposition  Admit   Condition  --   Comment  Hospital Area: [facility] [[id number]]  Level of Care: Telemetry Surgical [105]  May admit patient to [facility] or [facility] if equivalent level of care is available:: Yes  Covid Evaluation: Asymptomatic - no recent exposure (last 10 days) testing not required  Diagnosis: Multiple falls [721903]  Admitting Physician: Darlin Drop [[id number]]  Attending Physician: Darlin Drop [[id number]]  Certification:: I certify this patient will need inpatient services for at least 2 midnights  Expected Medical Readiness: 07/23/2023          B Medical/Surgery History Past Medical History:   Diagnosis Date   Acquired dilation of ascending aorta and aortic root (HCC)    40mm by echo 01/2021   Adenomatous colon polyp 2007   Anemia    Anxiety    Asthma    BPH without obstruction/lower urinary tract symptoms 02/22/2017   Chronic diastolic (congestive) heart failure (HCC)    Chronic venous stasis 03/07/2019   COPD (chronic obstructive pulmonary disease) (HCC)    Coronary artery calcification seen on CAT scan    Depression    Diabetic neuropathy (HCC) 09/11/2019   History of colon polyps 08/24/2018   Hypertension    Morbid obesity (HCC)    OSA (obstructive sleep apnea)    Pain due to onychomycosis of toenails of both feet 09/11/2019   Peripheral neuropathy 02/22/2017   Primary osteoarthritis, left shoulder 03/05/2017   PTSD (post-traumatic stress disorder)    Pure hypercholesterolemia    QT prolongation 03/07/2019   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 Lynnette Caffey and Dr. Laneta Simmers   Seizures [facility])    Severe aortic stenosis    Sinus tachycardia 03/07/2019   Sleep apnea    CPAP   Type 2 diabetes mellitus with vascular disease (HCC) 09/11/2019   Past Surgical History:  Procedure Laterality Date   ENDOVENOUS ABLATION SAPHENOUS VEIN W/ LASER Right 08/20/2020   endovenous laser ablation right greater saphenous vein by Cari Caraway MD    ENDOVENOUS ABLATION SAPHENOUS VEIN W/ LASER Left 11/16/2022   endovenous laser  ablation left greater saphenous vein by Cari Caraway MD   INTRAOPERATIVE TRANSTHORACIC ECHOCARDIOGRAM N/A 02/07/2023   Procedure: INTRAOPERATIVE TRANSTHORACIC ECHOCARDIOGRAM;  Surgeon: Orbie Pyo, MD;  Location: [facility] INVASIVE CV LAB;  Service: Open Heart Surgery;  Laterality: N/A;   JOINT REPLACEMENT     left knee replacement x 2   KNEE ARTHROSCOPY Bilateral    LEFT HEART CATH AND CORONARY ANGIOGRAPHY N/A 01/17/2018   Procedure: LEFT HEART CATH AND CORONARY ANGIOGRAPHY;  Surgeon: Marykay Lex, MD;  Location: [facility] INVASIVE CV LAB;  Service: Cardiovascular;  Laterality: N/A;   PRESSURE SENSOR/CARDIOMEMS N/A 08/26/2021   Procedure: PRESSURE SENSOR/CARDIOMEMS;  Surgeon: Laurey Morale, MD;  Location: [facility] INVASIVE CV LAB;  Service: Cardiovascular;  Laterality: N/A;   RIGHT HEART CATH N/A 08/26/2021   Procedure: RIGHT HEART CATH;  Surgeon: Laurey Morale, MD;  Location: [facility] INVASIVE CV LAB;  Service: Cardiovascular;  Laterality: N/A;   RIGHT HEART CATH AND CORONARY ANGIOGRAPHY N/A 01/26/2023   Procedure: RIGHT HEART CATH AND CORONARY ANGIOGRAPHY;  Surgeon: Orbie Pyo, MD;  Location: [facility] INVASIVE CV LAB;  Service: Cardiovascular;  Laterality: N/A;   TEE WITHOUT CARDIOVERSION N/A 08/26/2021   Procedure: TRANSESOPHAGEAL ECHOCARDIOGRAM (TEE);  Surgeon: Laurey Morale, MD;  Location: [facility] ENDOSCOPY;  Service: Cardiovascular;  Laterality: N/A;   TOOTH EXTRACTION N/A 02/03/2023   Procedure: DENTAL RESTORATION/EXTRACTIONS;  Surgeon: [name], DMD;  Location: [facility] OR;  Service: Oral Surgery;  Laterality: N/A;   TRANSCATHETER AORTIC VALVE REPLACEMENT, TRANSFEMORAL Right 02/07/2023   Procedure: Transcatheter Aortic Valve Replacement, Transfemoral;  Surgeon: Orbie Pyo, MD;  Location: [facility] INVASIVE CV LAB;  Service: Open Heart Surgery;  Laterality: Right;   UMBILICAL HERNIA REPAIR       A IV Location/Drains/Wounds Patient Lines/Drains/Airways Status     Active Line/Drains/Airways     Name Placement date Placement time Site Days   Peripheral IV 07/21/23 20 G Right;Medial Forearm 07/21/23  2200  Forearm  less than 1   Wound / Incision (Open or Dehisced) 10/26/22 Venous stasis ulcer Leg Left 10/26/22  2314  Leg  268            Intake/Output Last 24 hours No intake or output data in the 24 hours ending 07/21/23 2352  Labs/Imaging Results for orders placed or performed during the hospital encounter of 07/21/23 (from the past 48 hours)  CBC with Differential     Status: Abnormal    Collection Time: 07/21/23  9:37 PM  Result Value Ref Range   WBC 12.8 (H) 4.0 - 10.5 K/uL   RBC 5.14 4.22 - 5.81 MIL/uL   Hemoglobin 14.9 13.0 - 17.0 g/dL   HCT 16.1 09.6 - 04.5 %   MCV 90.3 80.0 - 100.0 fL   MCH 29.0 26.0 - 34.0 pg   MCHC 32.1 30.0 - 36.0 g/dL   RDW 40.9 81.1 - 91.4 %   Platelets 226 150 - 400 K/uL   nRBC 0.0 0.0 - 0.2 %   Neutrophils Relative % 79 %   Neutro Abs 10.2 (H) 1.7 - 7.7 K/uL   Lymphocytes Relative 11 %   Lymphs Abs 1.4 0.7 - 4.0 K/uL   Monocytes Relative 6 %   Monocytes Absolute 0.7 0.1 - 1.0 K/uL   Eosinophils Relative 3 %   Eosinophils Absolute 0.4 0.0 - 0.5 K/uL   Basophils Relative 1 %   Basophils Absolute 0.1 0.0 - 0.1 K/uL   Immature Granulocytes 0 %   Abs Immature  Granulocytes 0.04 0.00 - 0.07 K/uL    Comment: Performed at [facility], [address], [place], [place] [address]  Troponin I (High Sensitivity)     Status: None   Collection Time: 07/21/23  9:37 PM  Result Value Ref Range   Troponin I (High Sensitivity) 5 <18 ng/L    Comment: (NOTE) Elevated high sensitivity troponin I (hsTnI) values and significant  changes across serial measurements may suggest ACS but many other  chronic and acute conditions are known to elevate hsTnI results.  Refer to the "Links" section for chest pain algorithms and additional  guidance. Performed at [facility], [address], [place], [place] [address]   Comprehensive metabolic panel     Status: Abnormal   Collection Time: 07/21/23  9:37 PM  Result Value Ref Range   Sodium 142 135 - 145 mmol/L   Potassium 3.4 (L) 3.5 - 5.1 mmol/L   Chloride 100 98 - 111 mmol/L   CO2 29 22 - 32 mmol/L   Glucose, Bld 105 (H) 70 - 99 mg/dL    Comment: Glucose reference range applies only to samples taken after fasting for at least 8 hours.   BUN 24 (H) 8 - 23 mg/dL   Creatinine, Ser 0.98 (H) 0.61 - 1.24 mg/dL   Calcium 9.2 8.9 - 11.9 mg/dL   Total Protein 7.1 6.5 - 8.1 g/dL   Albumin 3.9 3.5 - 5.0  g/dL   AST 22 15 - 41 U/L   ALT 33 0 - 44 U/L   Alkaline Phosphatase 71 38 - 126 U/L   Total Bilirubin 0.7 <1.2 mg/dL   GFR, Estimated 53 (L) >60 mL/min    Comment: (NOTE) Calculated using the CKD-EPI Creatinine Equation (2021)    Anion gap 13 5 - 15    Comment: Performed at [facility], [address] [place], [place] [address]  Urinalysis, Routine w reflex microscopic -Urine, Clean Catch     Status: Abnormal   Collection Time: 07/21/23  9:37 PM  Result Value Ref Range   Color, Urine YELLOW YELLOW   APPearance CLEAR CLEAR   Specific Gravity, Urine 1.009 1.005 - 1.030   pH 5.0 5.0 - 8.0   Glucose, UA 50 (A) NEGATIVE mg/dL   Hgb urine dipstick NEGATIVE NEGATIVE   Bilirubin Urine NEGATIVE NEGATIVE   Ketones, ur NEGATIVE NEGATIVE mg/dL   Protein, ur NEGATIVE NEGATIVE mg/dL   Nitrite NEGATIVE NEGATIVE   Leukocytes,Ua NEGATIVE NEGATIVE    Comment: Performed at [facility], [address], [place], [place] [address]  CK     Status: None   Collection Time: 07/21/23  9:37 PM  Result Value Ref Range   Total CK 63 49 - 397 U/L    Comment: Performed at [facility], [address] [place], [place] [address]  I-stat chem 8, ED     Status: Abnormal   Collection Time: 07/21/23 10:05 PM  Result Value Ref Range   Sodium 144 135 - 145 mmol/L   Potassium 3.2 (L) 3.5 - 5.1 mmol/L   Chloride 100 98 - 111 mmol/L   BUN 26 (H) 8 - 23 mg/dL   Creatinine, Ser 7.84 (H) 0.61 - 1.24 mg/dL   Glucose, Bld 696 (H) 70 - 99 mg/dL    Comment: Glucose reference range applies only to samples taken after fasting for at least 8 hours.   Calcium, Ion 1.14 (L) 1.15 - 1.40 mmol/L  TCO2 33 (H) 22 - 32 mmol/L   Hemoglobin 15.3 13.0 - 17.0 g/dL   HCT 40.9 81.1 - 91.4 %   DG Chest Port 1 View Result Date: 07/21/2023 CLINICAL DATA:  Weakness. EXAM: PORTABLE CHEST 1 VIEW COMPARISON:  Chest radiograph 07/14/2023. FINDINGS: Low lung volumes accentuate the pulmonary vasculature and  cardiomediastinal silhouette. No consolidation or pulmonary edema. No pleural effusion or pneumothorax. IMPRESSION: Low lung volumes without evidence of acute cardiopulmonary disease. Electronically Signed   By: Orvan Falconer M.D.   On: 07/21/2023 21:40   CT Head Wo Contrast Result Date: 07/21/2023 CLINICAL DATA:  Transient ischemic attack (TIA).  Fall. EXAM: CT HEAD WITHOUT CONTRAST TECHNIQUE: Contiguous axial images were obtained from the base of the skull through the vertex without intravenous contrast. RADIATION DOSE REDUCTION: This exam was performed according to the departmental dose-optimization program which includes automated exposure control, adjustment of the mA and/or kV according to patient size and/or use of iterative reconstruction technique. COMPARISON:  MRI brain 07/14/2023. FINDINGS: Brain: No acute hemorrhage. Unchanged mild chronic small-vessel disease. Cortical gray-white differentiation is otherwise preserved. Prominence of the ventricles and sulci within expected range for age. No hydrocephalus or extra-axial collection. No mass effect or midline shift. Vascular: No hyperdense vessel or unexpected calcification. Skull: No calvarial fracture or suspicious bone lesion. Skull base is unremarkable. Sinuses/Orbits: Mild mucosal disease in the inferior left maxillary sinus and partial opacification of the ethmoid air cells. Orbits are unremarkable. Other: None. IMPRESSION: 1. No acute intracranial abnormality. 2. Unchanged mild chronic small-vessel disease. Electronically Signed   By: Orvan Falconer M.D.   On: 07/21/2023 21:39    Pending Labs Unresulted Labs (From admission, onward)     Start     Ordered   07/22/23 0500  CBC  Tomorrow morning,   R        07/21/23 2348   07/22/23 0500  Comprehensive metabolic panel  Tomorrow morning,   R        07/21/23 2348   07/22/23 0500  Magnesium  Tomorrow morning,   R        07/21/23 2348   07/22/23 0500  Phosphorus  Tomorrow morning,   R         07/21/23 2348            Vitals/Pain Today's Vitals   07/21/23 2315 07/21/23 2334 07/21/23 2335 07/21/23 2337  BP: 115/66   107/65  Pulse: 93   94  Resp: 15   19  Temp:    98.1 F (36.7 C)  TempSrc:    Oral  SpO2: 94%   100%  Weight:   (!) 155.1 kg   Height:   6\' 2"  (1.88 m)   PainSc:  5       Isolation Precautions No active isolations  Medications Medications  acetaminophen (TYLENOL) tablet 650 mg (has no administration in time range)  prochlorperazine (COMPAZINE) injection 5 mg (has no administration in time range)  polyethylene glycol (MIRALAX / GLYCOLAX) packet 17 g (has no administration in time range)  melatonin tablet 5 mg (has no administration in time range)  rosuvastatin (CRESTOR) tablet 40 mg (has no administration in time range)  levETIRAcetam (KEPPRA) tablet 500 mg (has no administration in time range)  Lacosamide TABS 150 mg (has no administration in time range)  ARIPiprazole (ABILIFY) tablet 5 mg (has no administration in time range)  fenofibrate tablet 54 mg (has no administration in time range)  icosapent Ethyl (VASCEPA) 1 g capsule 2  g (has no administration in time range)  pantoprazole (PROTONIX) EC tablet 40 mg (has no administration in time range)  acetaminophen (TYLENOL) tablet 1,000 mg (1,000 mg Oral Given 07/21/23 2123)  methocarbamol (ROBAXIN) tablet 500 mg (500 mg Oral Given 07/21/23 2123)    Mobility walks with device     Focused Assessments     R Recommendations: See Admitting Provider Note  Report given to:   Additional Notes:

## 2023-07-21 NOTE — ED Provider Notes (Signed)
[place] EMERGENCY DEPARTMENT AT [facility] Provider Note   CSN: [id number] Arrival date & time: 07/21/23  2046     History {Add pertinent medical, surgical, social history, OB history to HPI:1} Chief Complaint  Patient presents with   Lawrence [name] is a 67 y.o. male.  67 year old male with prior medical history as detailed below presents for evaluation.  Patient with gradually progressive generalized weakness over the last 2 weeks.  Patient reports multiple falls at home with increasing difficulty with ambulation despite use of rollator at home.  Today he fell 3 times -each time required EMS for lifting assistance.  Patient was agreeable with transport to the ED this evening for evaluation.  On arrival he is without complaint. Denies injury.  Patient reports increased weakness especially with ambulation since recent hospital admission.  Medical history includes hypertension, atrial fibrillation on Eliquis, CAD, diastolic congestive heart failure, s/p TAVR, recent left cerebellar stroke, DM 2, seizure disorder, COPD, spinal stimulator.  The history is provided by the patient and medical records.       Home Medications Prior to Admission medications   Medication Sig Start Date End Date Taking? Authorizing Provider  acetaminophen (TYLENOL) 650 MG CR tablet Take 1 tablet (650 mg total) by mouth every 8 (eight) hours as needed for pain. 07/10/23   Love, Evlyn Kanner, PA-C  albuterol (VENTOLIN HFA) 108 (90 Base) MCG/ACT inhaler Inhale 2 puffs into the lungs every 6 (six) hours as needed for wheezing or shortness of breath. 04/17/23   Hunsucker, Lesia Sago, MD  allopurinol (ZYLOPRIM) 100 MG tablet Take 1 tablet (100 mg total) by mouth daily for 6 days, THEN 1 tablet (100 mg total) 2 (two) times daily for 7 days, THEN 1.5 tablets (150 mg total) 2 (two) times daily for 7 days, THEN 2 tablets (200 mg total) 2 (two) times daily. 07/12/23 07/26/24  Jacquelynn Cree, PA-C   apixaban (ELIQUIS) 5 MG TABS tablet Take 1 tablet (5 mg total) by mouth 2 (two) times daily. 04/17/23   Laurey Morale, MD  ARIPiprazole (ABILIFY) 5 MG tablet Take 1 tablet (5 mg total) by mouth at bedtime. Patient taking differently: Take 5 mg by mouth every evening. 06/08/23   Mozingo, Thereasa Solo, NP  busPIRone (BUSPAR) 10 MG tablet Take 1 tablet (10 mg total) by mouth 3 (three) times daily. 04/17/23   Mozingo, Thereasa Solo, NP  Cholecalciferol (VITAMIN D3 PO) Take 2,000 Units by mouth daily.    [provider]  colchicine 0.6 MG tablet Take 1 tablet (0.6 mg total) by mouth daily. 07/12/23   Love, Evlyn Kanner, PA-C  cyanocobalamin (VITAMIN B12) 1000 MCG tablet Take 1,000 mcg by mouth daily.    [provider]  fenofibrate (TRICOR) 145 MG tablet Take 1 tablet (145 mg total) by mouth daily. 04/17/23   Laurey Morale, MD  ferrous sulfate 324 (65 Fe) MG TBEC Take 324 mg by mouth daily with breakfast.     [provider]  fluticasone (FLONASE) 50 MCG/ACT nasal spray Place 2 sprays into both nostrils daily.     [provider]  Fluticasone-Umeclidin-Vilant (TRELEGY ELLIPTA) 200-62.5-25 MCG/ACT AEPB INHALE 1 PUFF BY MOUTH INTO LUNGS ONCE DAILY 08/18/22   Hunsucker, Lesia Sago, MD  hydrocerin (EUCERIN) CREA Apply 1 Application topically 2 (two) times daily. 07/17/23 08/16/23  Briant Cedar, MD  icosapent Ethyl (VASCEPA) 1 g capsule Take 2 capsules (2 g total) by mouth 2 (two) times  daily. 04/17/23   Laurey Morale, MD  insulin aspart (NOVOLOG FLEXPEN) 100 UNIT/ML FlexPen INJECT 8 UNITS BEFORE MEALS, keep sugar TWO HOURS AFTER meals UNDER 180 AT least 07/11/23   Love, Pamela S, PA-C  insulin degludec (TRESIBA FLEXTOUCH) 100 UNIT/ML FlexTouch Pen Inject 30 Units into the skin daily. 07/11/23   Love, Evlyn Kanner, PA-C  Insulin Pen Needle 32G X 4 MM MISC Use as directed 07/12/23   Love, Evlyn Kanner, PA-C  KLOR-CON M20 20 MEQ tablet TAKE 2 TABLETS (40 MEQ TOTAL) BY MOUTH 2 (TWO)  TIMES DAILY. EXCEPT TUESDAYS WHEN YOU TAKE ( 3 TABS) Patient taking differently: Take 40 mEq by mouth 3 (three) times daily. 07/13/23   Laurey Morale, MD  Lacosamide 150 MG TABS Take 1 tablet (150 mg total) by mouth 2 (two) times daily. 05/04/23   Windell Norfolk, MD  levETIRAcetam (KEPPRA) 500 MG tablet Take 1 tablet (500 mg total) by mouth 2 (two) times daily. 05/04/23   Windell Norfolk, MD  Menthol-Methyl Salicylate (MUSCLE RUB) 10-15 % CREA Apply 1 Application topically as needed for muscle pain (to knees for pain). 07/10/23   Love, Evlyn Kanner, PA-C  methocarbamol (ROBAXIN) 500 MG tablet Take 1 tablet (500 mg total) by mouth every 8 (eight) hours as needed for muscle spasms. 07/11/23   Love, Evlyn Kanner, PA-C  metoprolol succinate (TOPROL-XL) 25 MG 24 hr tablet Take 1 tablet (25 mg total) by mouth daily. 07/12/23   Love, Evlyn Kanner, PA-C  omeprazole (PRILOSEC) 20 MG capsule Take 20 mg by mouth every morning. 05/29/20   [provider]  polyethylene glycol powder (GLYCOLAX/MIRALAX) 17 GM/SCOOP powder Mix in water as directed and take 1 capful (17 g) by mouth daily. 07/12/23   Love, Evlyn Kanner, PA-C  pregabalin (LYRICA) 150 MG capsule Take 1 capsule (150 mg total) by mouth 3 (three) times daily. 05/04/23   Windell Norfolk, MD  rosuvastatin (CRESTOR) 40 MG tablet Take 1 tablet (40 mg total) by mouth daily. 07/12/23   Love, Evlyn Kanner, PA-C  senna-docusate (SENOKOT-S) 8.6-50 MG tablet Take 1 tablet by mouth at bedtime. 07/11/23   Love, Evlyn Kanner, PA-C  tirzepatide [facility]) 15 MG/0.5ML Pen INJECT 15 MG INTO THE SKIN ONCE A WEEK. Patient taking differently: 15 mg once a week. Inject on Monday 06/07/23   Altamese [place], MD  torsemide (DEMADEX) 20 MG tablet Take 6 tablets (120 mg total) by mouth 2 (two) times daily. 07/12/23   Love, Evlyn Kanner, PA-C  traZODone (DESYREL) 100 MG tablet Take 0.5 tablets (50 mg total) by mouth at bedtime. 07/10/23   Jacquelynn Cree, PA-C  venlafaxine XR (EFFEXOR-XR) 75 MG 24 hr  capsule Take 3 capsules (225 mg total) by mouth daily with breakfast. 04/17/23   Mozingo, Thereasa Solo, NP      Allergies    Vancomycin; Niacin and related; Tubersol [tuberculin, ppd]; and Doxycycline    Review of Systems   Review of Systems  All other systems reviewed and are negative.   Physical Exam Updated Vital Signs BP 125/75 (BP Location: Right Arm)   Pulse 88   Temp 97.6 F (36.4 C)   Resp 16   SpO2 95%  Physical Exam Vitals and nursing note reviewed.  Constitutional:      General: He is not in acute distress.    Appearance: Normal appearance. He is well-developed.  HENT:     Head: Normocephalic and atraumatic.  Eyes:     Conjunctiva/sclera: Conjunctivae normal.  Pupils: Pupils are equal, round, and reactive to light.  Cardiovascular:     Rate and Rhythm: Normal rate and regular rhythm.     Heart sounds: Normal heart sounds.  Pulmonary:     Effort: Pulmonary effort is normal. No respiratory distress.     Breath sounds: Normal breath sounds.  Abdominal:     General: There is no distension.     Palpations: Abdomen is soft.     Tenderness: There is no abdominal tenderness.  Musculoskeletal:        General: No deformity. Normal range of motion.     Cervical back: Normal range of motion and neck supple.  Skin:    General: Skin is warm and dry.  Neurological:     General: No focal deficit present.     Mental Status: He is alert and oriented to person, place, and time.     ED Results / Procedures / Treatments   Labs (all labs ordered are listed, but only abnormal results are displayed) Labs Reviewed - No data to display  EKG None  Radiology No results found.  Procedures Procedures  {Document cardiac monitor, telemetry assessment procedure when appropriate:1}  Medications Ordered in ED Medications - No data to display  ED Course/ Medical Decision Making/ A&P   {   Click here for ABCD2, HEART and other calculatorsREFRESH Note before signing  :1}                              Medical Decision Making Amount and/or Complexity of Data Reviewed Labs: ordered. Radiology: ordered.    Medical Screen Complete  This patient presented to the ED with complaint of weakness.  This complaint involves an extensive number of treatment options. The initial differential diagnosis includes, but is not limited to, ***  This presentation is: {IllnessRisk:19196::"***","Acute","Chronic","Self-Limited","Previously Undiagnosed","Uncertain Prognosis","Complicated","Systemic Symptoms","Threat to Life/Bodily Function"}    Co morbidities that complicated the patient's evaluation  ***   Additional history obtained:  Additional history obtained from {History source:19196::"EMS","Spouse","Family","Friend","Caregiver"} External records from outside sources obtained and reviewed including prior ED visits and prior Inpatient records.    Lab Tests:  I ordered and personally interpreted labs.  The pertinent results include:  ***   Imaging Studies ordered:  I ordered imaging studies including ***  I independently visualized and interpreted obtained imaging which showed *** I agree with the radiologist interpretation.   Cardiac Monitoring:  The patient was maintained on a cardiac monitor.  I personally viewed and interpreted the cardiac monitor which showed an underlying rhythm of: ***   Medicines ordered:  I ordered medication including ***  for ***  Reevaluation of the patient after these medicines showed that the patient: {resolved/improved/worsened:23923::"improved"}    Test Considered:  ***   Critical Interventions:  ***   Consultations Obtained:  I consulted ***,  and discussed lab and imaging findings as well as pertinent plan of care.    Problem List / ED Course:  ***   Reevaluation:  After the interventions noted above, I reevaluated the patient and found that they have:  {resolved/improved/worsened:23923::"improved"}   Social Determinants of Health:  ***   Disposition:  After consideration of the diagnostic results and the patients response to treatment, I feel that the patent would benefit from ***.    {Document critical care time when appropriate:1} {Document review of labs and clinical decision tools ie heart score, Chads2Vasc2 etc:1}  {Document your independent review of radiology images,  and any outside records:1} {Document your discussion with family members, caretakers, and with consultants:1} {Document social determinants of health affecting pt's care:1} {Document your decision making why or why not admission, treatments were needed:1} Final Clinical Impression(s) / ED Diagnoses Final diagnoses:  None    Rx / DC Orders ED Discharge Orders     None

## 2023-07-21 NOTE — ED Triage Notes (Signed)
Pt arrives via [facility]. Per their report, pt is coming from home (lives with wife)  had multiple falls this week (2 today) weakness Trying to get back into powered recliner, sitting on edge and slid out onto the floor. Did not hit his head. No pain, no injury. No LOC. On eliquis. Issues ambulating since d/c from hospital admission, due to start physical therapy next week. Vitals 160/68, hr 70's, afib, bigeminy., cbg 157.

## 2023-07-21 NOTE — H&P (Signed)
Fresh History and Physical  Lawrence [name] OVF:[id number] DOB: 1955/09/16 DOA: 07/21/2023  Referring physician: Dr. Rodena Medin, EDP  PCP: [name], PA-C  Outpatient Specialists: Cardiology. Patient coming from: Home.  Chief Complaint: Multiple falls.  HPI: Lawrence [name] is a 67 y.o. male with medical history significant for hypertension, permanent atrial fibrillation on Eliquis, coronary artery disease, HFpEF, valvular disease status post TAVR, recent left cerebellar stroke, type 2 diabetes, seizure disorder, COPD, status post spinal stimulator, recently admitted on 07/14/2023, recurrent falls, and discharged on 07/17/2023, who presents to the ED with multiple falls at home requiring EMS to come to his house to get him off the floor after his falls.  He finally agreed to come to the ED for further evaluation.  In the ED, no fracture seen on imaging.  UA negative for pyuria.  Creatinine elevated above baseline and hypokalemia.  EDP requested admission for AKI, PT OT and possible placement.  Admitted by [facility], hospitalist service.  ED Course: Temperature 97.9.  BP 115/66, pulse 93, respiratory rate 18, O2 saturation 94% on room air.  Lab studies notable for WBC 12.8, hemoglobin 14.9, platelet count 226.  Serum potassium 3.2, glucose 103, BUN 26, creatinine 1.60 from baseline creatinine 0.91, GFR 53.  Troponin 5, CPK 63.  Review of Systems: Review of systems as noted in the HPI. All other systems reviewed and are negative.   Past Medical History:  Diagnosis Date   Acquired dilation of ascending aorta and aortic root (HCC)    40mm by echo 01/2021   Adenomatous colon polyp 2007   Anemia    Anxiety    Asthma    BPH without obstruction/lower urinary tract symptoms 02/22/2017   Chronic diastolic (congestive) heart failure (HCC)    Chronic venous stasis 03/07/2019   COPD (chronic obstructive pulmonary disease) (HCC)    Coronary artery calcification seen on CAT scan    Depression     Diabetic neuropathy (HCC) 09/11/2019   History of colon polyps 08/24/2018   Hypertension    Morbid obesity (HCC)    OSA (obstructive sleep apnea)    Pain due to onychomycosis of toenails of both feet 09/11/2019   Peripheral neuropathy 02/22/2017   Primary osteoarthritis, left shoulder 03/05/2017   PTSD (post-traumatic stress disorder)    Pure hypercholesterolemia    QT prolongation 03/07/2019   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 Lynnette Caffey and Dr. Laneta Simmers   Seizures [facility]    Severe aortic stenosis    Sinus tachycardia 03/07/2019   Sleep apnea    CPAP   Type 2 diabetes mellitus with vascular disease (HCC) 09/11/2019   Past Surgical History:  Procedure Laterality Date   ENDOVENOUS ABLATION SAPHENOUS VEIN W/ LASER Right 08/20/2020   endovenous laser ablation right greater saphenous vein by Cari Caraway MD    ENDOVENOUS ABLATION SAPHENOUS VEIN W/ LASER Left 11/16/2022   endovenous laser ablation left greater saphenous vein by Cari Caraway MD   INTRAOPERATIVE TRANSTHORACIC ECHOCARDIOGRAM N/A 02/07/2023   Procedure: INTRAOPERATIVE TRANSTHORACIC ECHOCARDIOGRAM;  Surgeon: Orbie Pyo, MD;  Location: [facility] INVASIVE CV LAB;  Service: Open Heart Surgery;  Laterality: N/A;   JOINT REPLACEMENT     left knee replacement x 2   KNEE ARTHROSCOPY Bilateral    LEFT HEART CATH AND CORONARY ANGIOGRAPHY N/A 01/17/2018   Procedure: LEFT HEART CATH AND CORONARY ANGIOGRAPHY;  Surgeon: Marykay Lex, MD;  Location: [facility] INVASIVE CV LAB;  Service:  Cardiovascular;  Laterality: N/A;   PRESSURE SENSOR/CARDIOMEMS N/A 08/26/2021   Procedure: PRESSURE SENSOR/CARDIOMEMS;  Surgeon: Laurey Morale, MD;  Location: [facility] INVASIVE CV LAB;  Service: Cardiovascular;  Laterality: N/A;   RIGHT HEART CATH N/A 08/26/2021   Procedure: RIGHT HEART CATH;  Surgeon: Laurey Morale, MD;  Location: [facility] INVASIVE CV LAB;  Service: Cardiovascular;  Laterality: N/A;    RIGHT HEART CATH AND CORONARY ANGIOGRAPHY N/A 01/26/2023   Procedure: RIGHT HEART CATH AND CORONARY ANGIOGRAPHY;  Surgeon: Orbie Pyo, MD;  Location: [facility] INVASIVE CV LAB;  Service: Cardiovascular;  Laterality: N/A;   TEE WITHOUT CARDIOVERSION N/A 08/26/2021   Procedure: TRANSESOPHAGEAL ECHOCARDIOGRAM (TEE);  Surgeon: Laurey Morale, MD;  Location: [facility] ENDOSCOPY;  Service: Cardiovascular;  Laterality: N/A;   TOOTH EXTRACTION N/A 02/03/2023   Procedure: DENTAL RESTORATION/EXTRACTIONS;  Surgeon: [name], DMD;  Location: [facility] OR;  Service: Oral Surgery;  Laterality: N/A;   TRANSCATHETER AORTIC VALVE REPLACEMENT, TRANSFEMORAL Right 02/07/2023   Procedure: Transcatheter Aortic Valve Replacement, Transfemoral;  Surgeon: Orbie Pyo, MD;  Location: [facility] INVASIVE CV LAB;  Service: Open Heart Surgery;  Laterality: Right;   UMBILICAL HERNIA REPAIR      Social History:  reports that he quit smoking about 7 years ago. His smoking use included cigarettes. He started smoking about 51 years ago. He has a 44 pack-year smoking history. He has been exposed to tobacco smoke. He has never used smokeless tobacco. He reports current alcohol use. He reports that he does not use drugs.   Allergies  Allergen Reactions   Vancomycin Other (See Comments)    "Red Man Syndrome" 02/02/17: possible cause for rash under both arms   Niacin And Related Other (See Comments)    Red man syndrome   Tubersol [Tuberculin, Ppd] Other (See Comments)    Reaction unknown   Doxycycline Rash and Other (See Comments)    Family History  Problem Relation Age of Onset   CAD Maternal Grandfather    Diabetes Other    Diabetes Mellitus II Neg Hx    Colon cancer Neg Hx    Esophageal cancer Neg Hx    Inflammatory bowel disease Neg Hx    Liver disease Neg Hx    Pancreatic cancer Neg Hx    Rectal cancer Neg Hx    Stomach cancer Neg Hx    Sleep apnea Neg Hx       Prior to Admission medications   Medication Sig Start Date End  Date Taking? Authorizing Provider  acetaminophen (TYLENOL) 650 MG CR tablet Take 1 tablet (650 mg total) by mouth every 8 (eight) hours as needed for pain. 07/10/23   Love, Evlyn Kanner, PA-C  albuterol (VENTOLIN HFA) 108 (90 Base) MCG/ACT inhaler Inhale 2 puffs into the lungs every 6 (six) hours as needed for wheezing or shortness of breath. 04/17/23   Hunsucker, Lesia Sago, MD  allopurinol (ZYLOPRIM) 100 MG tablet Take 1 tablet (100 mg total) by mouth daily for 6 days, THEN 1 tablet (100 mg total) 2 (two) times daily for 7 days, THEN 1.5 tablets (150 mg total) 2 (two) times daily for 7 days, THEN 2 tablets (200 mg total) 2 (two) times daily. 07/12/23 07/26/24  Jacquelynn Cree, PA-C  apixaban (ELIQUIS) 5 MG TABS tablet Take 1 tablet (5 mg total) by mouth 2 (two) times daily. 04/17/23   Laurey Morale, MD  ARIPiprazole (ABILIFY) 5 MG tablet Take 1 tablet (5 mg total) by mouth at bedtime. Patient taking  differently: Take 5 mg by mouth every evening. 06/08/23   Mozingo, Thereasa Solo, NP  busPIRone (BUSPAR) 10 MG tablet Take 1 tablet (10 mg total) by mouth 3 (three) times daily. 04/17/23   Mozingo, Thereasa Solo, NP  Cholecalciferol (VITAMIN D3 PO) Take 2,000 Units by mouth daily.    [provider]  colchicine 0.6 MG tablet Take 1 tablet (0.6 mg total) by mouth daily. 07/12/23   Love, Evlyn Kanner, PA-C  cyanocobalamin (VITAMIN B12) 1000 MCG tablet Take 1,000 mcg by mouth daily.    [provider]  fenofibrate (TRICOR) 145 MG tablet Take 1 tablet (145 mg total) by mouth daily. 04/17/23   Laurey Morale, MD  ferrous sulfate 324 (65 Fe) MG TBEC Take 324 mg by mouth daily with breakfast.     [provider]  fluticasone (FLONASE) 50 MCG/ACT nasal spray Place 2 sprays into both nostrils daily.     [provider]  Fluticasone-Umeclidin-Vilant (TRELEGY ELLIPTA) 200-62.5-25 MCG/ACT AEPB INHALE 1 PUFF BY MOUTH INTO LUNGS ONCE DAILY 08/18/22   Hunsucker, Lesia Sago, MD  hydrocerin  (EUCERIN) CREA Apply 1 Application topically 2 (two) times daily. 07/17/23 08/16/23  Briant Cedar, MD  icosapent Ethyl (VASCEPA) 1 g capsule Take 2 capsules (2 g total) by mouth 2 (two) times daily. 04/17/23   Laurey Morale, MD  insulin aspart (NOVOLOG FLEXPEN) 100 UNIT/ML FlexPen INJECT 8 UNITS BEFORE MEALS, keep sugar TWO HOURS AFTER meals UNDER 180 AT least 07/11/23   Love, Pamela S, PA-C  insulin degludec (TRESIBA FLEXTOUCH) 100 UNIT/ML FlexTouch Pen Inject 30 Units into the skin daily. 07/11/23   Love, Evlyn Kanner, PA-C  Insulin Pen Needle 32G X 4 MM MISC Use as directed 07/12/23   Love, Evlyn Kanner, PA-C  KLOR-CON M20 20 MEQ tablet TAKE 2 TABLETS (40 MEQ TOTAL) BY MOUTH 2 (TWO) TIMES DAILY. EXCEPT TUESDAYS WHEN YOU TAKE ( 3 TABS) Patient taking differently: Take 40 mEq by mouth 3 (three) times daily. 07/13/23   Laurey Morale, MD  Lacosamide 150 MG TABS Take 1 tablet (150 mg total) by mouth 2 (two) times daily. 05/04/23   Windell Norfolk, MD  levETIRAcetam (KEPPRA) 500 MG tablet Take 1 tablet (500 mg total) by mouth 2 (two) times daily. 05/04/23   Windell Norfolk, MD  Menthol-Methyl Salicylate (MUSCLE RUB) 10-15 % CREA Apply 1 Application topically as needed for muscle pain (to knees for pain). 07/10/23   Love, Evlyn Kanner, PA-C  methocarbamol (ROBAXIN) 500 MG tablet Take 1 tablet (500 mg total) by mouth every 8 (eight) hours as needed for muscle spasms. 07/11/23   Love, Evlyn Kanner, PA-C  metoprolol succinate (TOPROL-XL) 25 MG 24 hr tablet Take 1 tablet (25 mg total) by mouth daily. 07/12/23   Love, Evlyn Kanner, PA-C  omeprazole (PRILOSEC) 20 MG capsule Take 20 mg by mouth every morning. 05/29/20   [provider]  polyethylene glycol powder (GLYCOLAX/MIRALAX) 17 GM/SCOOP powder Mix in water as directed and take 1 capful (17 g) by mouth daily. 07/12/23   Love, Evlyn Kanner, PA-C  pregabalin (LYRICA) 150 MG capsule Take 1 capsule (150 mg total) by mouth 3 (three) times daily. 05/04/23   Windell Norfolk, MD   rosuvastatin (CRESTOR) 40 MG tablet Take 1 tablet (40 mg total) by mouth daily. 07/12/23   Love, Evlyn Kanner, PA-C  senna-docusate (SENOKOT-S) 8.6-50 MG tablet Take 1 tablet by mouth at bedtime. 07/11/23   Love, Evlyn Kanner, PA-C  tirzepatide [facility]) 15 MG/0.5ML  Pen INJECT 15 MG INTO THE SKIN ONCE A WEEK. Patient taking differently: 15 mg once a week. Inject on Monday 06/07/23   Altamese [place], MD  torsemide (DEMADEX) 20 MG tablet Take 6 tablets (120 mg total) by mouth 2 (two) times daily. 07/12/23   Love, Evlyn Kanner, PA-C  traZODone (DESYREL) 100 MG tablet Take 0.5 tablets (50 mg total) by mouth at bedtime. 07/10/23   Love, Evlyn Kanner, PA-C  venlafaxine XR (EFFEXOR-XR) 75 MG 24 hr capsule Take 3 capsules (225 mg total) by mouth daily with breakfast. 04/17/23   Mozingo, Thereasa Solo, NP    Physical Exam: BP 115/66   Pulse 93   Temp 97.9 F (36.6 C)   Resp 15   SpO2 94%   General: 67 y.o. year-old male well developed well nourished in no acute distress.  Alert and oriented x3. Cardiovascular: Regular rate and rhythm with no rubs or gallops.  No thyromegaly or JVD noted.  No lower extremity edema. 2/4 pulses in all 4 extremities. Respiratory: Clear to auscultation with no wheezes or rales. Good inspiratory effort. Abdomen: Soft nontender nondistended with normal bowel sounds x4 quadrants. Muskuloskeletal: No cyanosis, clubbing or edema noted bilaterally Neuro: CN II-XII intact, strength, sensation, reflexes Skin: No ulcerative lesions noted or rashes Psychiatry: Judgement and insight appear normal. Mood is appropriate for condition and setting          Labs on Admission:  Basic Metabolic Panel: Recent Labs  Lab 07/15/23 0434 07/16/23 0634 07/17/23 0623 07/21/23 2137 07/21/23 2205  NA 139 134* 139 142 144  K 3.6 3.5 3.7 3.4* 3.2*  CL 105 105 105 100 100  CO2 27 22 27 29   --   GLUCOSE 141* 101* 139* 105* 103*  BUN 20 15 11  24* 26*  CREATININE 1.07 0.85 0.91 1.46* 1.60*  CALCIUM 8.7*  8.8* 9.2 9.2  --    Liver Function Tests: Recent Labs  Lab 07/21/23 2137  AST 22  ALT 33  ALKPHOS 71  BILITOT 0.7  PROT 7.1  ALBUMIN 3.9   No results for input(s): "LIPASE", "AMYLASE" in the last 168 hours. No results for input(s): "AMMONIA" in the last 168 hours. CBC: Recent Labs  Lab 07/15/23 0434 07/16/23 0634 07/21/23 2137 07/21/23 2205  WBC 9.0 7.2 12.8*  --   NEUTROABS  --   --  10.2*  --   HGB 13.6 14.4 14.9 15.3  HCT 42.3 44.0 46.4 45.0  MCV 90.4 90.3 90.3  --   PLT 243 195 226  --    Cardiac Enzymes: Recent Labs  Lab 07/21/23 2137  CKTOTAL 63    BNP (last 3 results) Recent Labs    06/09/23 1354 07/14/23 0314 07/15/23 1012  BNP 23.1 27.5 78.1    ProBNP (last 3 results) No results for input(s): "PROBNP" in the last 8760 hours.  CBG: Recent Labs  Lab 07/17/23 0626 07/17/23 0820 07/17/23 1226 07/17/23 1613 07/17/23 2125  GLUCAP 144* 151* 213* 173* 146*    Radiological Exams on Admission: DG Chest Port 1 View Result Date: 07/21/2023 CLINICAL DATA:  Weakness. EXAM: PORTABLE CHEST 1 VIEW COMPARISON:  Chest radiograph 07/14/2023. FINDINGS: Low lung volumes accentuate the pulmonary vasculature and cardiomediastinal silhouette. No consolidation or pulmonary edema. No pleural effusion or pneumothorax. IMPRESSION: Low lung volumes without evidence of acute cardiopulmonary disease. Electronically Signed   By: Orvan Falconer M.D.   On: 07/21/2023 21:40   CT Head Wo Contrast Result Date: 07/21/2023 CLINICAL DATA:  Transient ischemic  attack (TIA).  Fall. EXAM: CT HEAD WITHOUT CONTRAST TECHNIQUE: Contiguous axial images were obtained from the base of the skull through the vertex without intravenous contrast. RADIATION DOSE REDUCTION: This exam was performed according to the departmental dose-optimization program which includes automated exposure control, adjustment of the mA and/or kV according to patient size and/or use of iterative reconstruction  technique. COMPARISON:  MRI brain 07/14/2023. FINDINGS: Brain: No acute hemorrhage. Unchanged mild chronic small-vessel disease. Cortical gray-white differentiation is otherwise preserved. Prominence of the ventricles and sulci within expected range for age. No hydrocephalus or extra-axial collection. No mass effect or midline shift. Vascular: No hyperdense vessel or unexpected calcification. Skull: No calvarial fracture or suspicious bone lesion. Skull base is unremarkable. Sinuses/Orbits: Mild mucosal disease in the inferior left maxillary sinus and partial opacification of the ethmoid air cells. Orbits are unremarkable. Other: None. IMPRESSION: 1. No acute intracranial abnormality. 2. Unchanged mild chronic small-vessel disease. Electronically Signed   By: Orvan Falconer M.D.   On: 07/21/2023 21:39    EKG: I independently viewed the EKG done and my findings are as followed: Atrial fibrillation rate of 96.  Nonspecific ST-T changes.  QTc 426.  Assessment/Plan Present on Admission: **None**  Principal Problem:   Multiple falls  Multiple falls in the setting of recently diagnosed cerebellar stroke(MRI brain 06/25/2023) PT OT assessment Fall precautions Obtain orthostatic vital signs. TOC consulted to assist with discharge planning  AKI suspect prerenal in setting of dehydration from poor oral intake Normal baseline renal function Presented with creatinine of 1.60 with GFR 53. Start gentle IV fluid hydration NS at 50 cc/h x 1 day Avoid nephrotoxic agents, dehydration and hypotension Monitor urine output Repeat renal function panel in the morning  Hypokalemia Serum potassium 3.2 Repleted orally with KCl 20 mill equivalent x 2 doses. Check magnesium level Repeat level in the morning  Persistent atrial fibrillation on Eliquis Hold off Eliquis for now due to risk of bleeding Monitor on telemetry BPs are currently soft.  Seizure disorder Resume home AEDs. Seizure  precautions.  Hyperlipidemia Resume home regimen  Chronic HFpEF Euvolemic on exam Start strict I's and O's and daily weight Closely monitor volume status while on gentle IV fluid hydration NS at 50 cc/h x 1 day  Iron-deficiency anemia Resume home regimen.  Type 2 diabetes with hyperlipidemia Last hemoglobin A1c 6.5 on 06/26/2023 Hold off insulin sliding scale for now, start if hyperglycemic Continue CBGs  Diabetic polyneuropathy Resume home regimen.  GERD Resume home PPI  Severe morbid obesity BMI 43 Recommend weight loss outpatient with regular physical activity and healthy diet.   Time: 75 minutes.   DVT prophylaxis: SCDs  Code Status: DNR  Family Communication: None at bedside  Disposition Plan: Admitted to telemetry surgical unit  Consults called: None.  Admission status: Inpatient status.   Status is: Inpatient The patient requires at least 2 midnights for further evaluation and treatment of present condition.  Darlin Drop MD [facility] Pager [phone]  If 7PM-7AM, please contact night-coverage [email or web address] Password [facility]  07/21/2023, 11:27 PM

## 2023-07-21 NOTE — ED Notes (Signed)
Patient transported to CT 

## 2023-07-22 DIAGNOSIS — R531 Weakness: Secondary | ICD-10-CM | POA: Diagnosis not present

## 2023-07-22 DIAGNOSIS — R296 Repeated falls: Secondary | ICD-10-CM | POA: Diagnosis not present

## 2023-07-22 LAB — CBC
HCT: 40.1 % (ref 39.0–52.0)
Hemoglobin: 13.4 g/dL (ref 13.0–17.0)
MCH: 29.5 pg (ref 26.0–34.0)
MCHC: 33.4 g/dL (ref 30.0–36.0)
MCV: 88.1 fL (ref 80.0–100.0)
Platelets: 202 10*3/uL (ref 150–400)
RBC: 4.55 MIL/uL (ref 4.22–5.81)
RDW: 15 % (ref 11.5–15.5)
WBC: 9.3 10*3/uL (ref 4.0–10.5)
nRBC: 0 % (ref 0.0–0.2)

## 2023-07-22 LAB — GLUCOSE, CAPILLARY: Glucose-Capillary: 153 mg/dL — ABNORMAL HIGH (ref 70–99)

## 2023-07-22 LAB — TROPONIN I (HIGH SENSITIVITY): Troponin I (High Sensitivity): 4 ng/L (ref <18)

## 2023-07-22 LAB — MAGNESIUM: Magnesium: 1.5 mg/dL — ABNORMAL LOW (ref 1.7–2.4)

## 2023-07-22 LAB — VITAMIN B12: Vitamin B-12: 194 pg/mL (ref 180–914)

## 2023-07-22 LAB — BASIC METABOLIC PANEL
Anion gap: 10 (ref 5–15)
BUN: 24 mg/dL — ABNORMAL HIGH (ref 8–23)
CO2: 27 mmol/L (ref 22–32)
Calcium: 8.5 mg/dL — ABNORMAL LOW (ref 8.9–10.3)
Chloride: 101 mmol/L (ref 98–111)
Creatinine, Ser: 1.16 mg/dL (ref 0.61–1.24)
GFR, Estimated: >60 mL/min (ref >60)
Glucose, Bld: 114 mg/dL — ABNORMAL HIGH (ref 70–99)
Potassium: 3.2 mmol/L — ABNORMAL LOW (ref 3.5–5.1)
Sodium: 138 mmol/L (ref 135–145)

## 2023-07-22 LAB — PHOSPHORUS: Phosphorus: 3.2 mg/dL (ref 2.5–4.6)

## 2023-07-22 MED ORDER — VITAMIN B-12 1000 MCG PO TABS
1000.0000 ug | ORAL_TABLET | Freq: Every day | ORAL | Status: DC
  Administered 2023-07-22 – 2023-07-23 (×2): 1000 ug via ORAL
  Filled 2023-07-22 (×2): qty 1

## 2023-07-22 MED ORDER — MIRTAZAPINE 15 MG PO TABS
15.0000 mg | ORAL_TABLET | Freq: Every day | ORAL | Status: DC
  Administered 2023-07-22 – 2023-07-24 (×3): 15 mg via ORAL
  Filled 2023-07-22 (×3): qty 1

## 2023-07-22 MED ORDER — FLUTICASONE FUROATE-VILANTEROL 200-25 MCG/ACT IN AEPB
1.0000 | INHALATION_SPRAY | Freq: Every day | RESPIRATORY_TRACT | Status: DC
  Administered 2023-07-24 – 2023-07-25 (×2): 1 via RESPIRATORY_TRACT
  Filled 2023-07-22: qty 28

## 2023-07-22 MED ORDER — BACITRACIN ZINC 500 UNIT/GM EX OINT
TOPICAL_OINTMENT | Freq: Two times a day (BID) | CUTANEOUS | Status: DC
  Administered 2023-07-22 – 2023-07-25 (×4): 31.5 via TOPICAL
  Filled 2023-07-22: qty 28.4

## 2023-07-22 MED ORDER — METHOCARBAMOL 500 MG PO TABS
500.0000 mg | ORAL_TABLET | Freq: Once | ORAL | Status: AC | PRN
  Administered 2023-07-23: 500 mg via ORAL
  Filled 2023-07-22: qty 1

## 2023-07-22 MED ORDER — VENLAFAXINE HCL ER 75 MG PO CP24
75.0000 mg | ORAL_CAPSULE | Freq: Every day | ORAL | Status: DC
Start: 2023-07-23 — End: 2023-07-25
  Administered 2023-07-23 – 2023-07-25 (×3): 75 mg via ORAL
  Filled 2023-07-22 (×3): qty 1

## 2023-07-22 MED ORDER — SODIUM CHLORIDE 0.9 % IV SOLN: INTRAVENOUS | Status: AC

## 2023-07-22 MED ORDER — ATORVASTATIN CALCIUM 40 MG PO TABS: 40.0000 mg | ORAL_TABLET | Freq: Every day | ORAL | Status: DC

## 2023-07-22 MED ORDER — HYDROMORPHONE HCL 1 MG/ML IJ SOLN
0.5000 mg | INTRAMUSCULAR | Status: AC | PRN
  Administered 2023-07-22 (×2): 0.5 mg via INTRAVENOUS
  Filled 2023-07-22 (×2): qty 0.5

## 2023-07-22 MED ORDER — UMECLIDINIUM BROMIDE 62.5 MCG/ACT IN AEPB
1.0000 | INHALATION_SPRAY | Freq: Every day | RESPIRATORY_TRACT | Status: DC
  Administered 2023-07-23 – 2023-07-25 (×3): 1 via RESPIRATORY_TRACT
  Filled 2023-07-22: qty 7

## 2023-07-22 MED ORDER — METOPROLOL SUCCINATE ER 50 MG PO TB24
50.0000 mg | ORAL_TABLET | Freq: Every day | ORAL | Status: DC
  Administered 2023-07-22 – 2023-07-25 (×3): 50 mg via ORAL
  Filled 2023-07-22 (×4): qty 1

## 2023-07-22 MED ORDER — INSULIN DEGLUDEC 100 UNIT/ML ~~LOC~~ SOPN
20.0000 [IU] | PEN_INJECTOR | Freq: Every day | SUBCUTANEOUS | Status: DC
  Administered 2023-07-23 – 2023-07-24 (×2): 20 [IU] via SUBCUTANEOUS
  Filled 2023-07-22: qty 3

## 2023-07-22 MED ORDER — COLCHICINE 0.6 MG PO TABS
0.6000 mg | ORAL_TABLET | Freq: Every day | ORAL | Status: DC
  Administered 2023-07-22 – 2023-07-25 (×4): 0.6 mg via ORAL
  Filled 2023-07-22 (×4): qty 1

## 2023-07-22 MED ORDER — ROPINIROLE HCL 0.25 MG PO TABS
0.2500 mg | ORAL_TABLET | Freq: Two times a day (BID) | ORAL | Status: DC
  Administered 2023-07-22 – 2023-07-25 (×7): 0.25 mg via ORAL
  Filled 2023-07-22 (×8): qty 1

## 2023-07-22 MED ORDER — POTASSIUM CHLORIDE 10 MEQ/100ML IV SOLN
10.0000 meq | INTRAVENOUS | Status: AC
  Administered 2023-07-22 (×3): 10 meq via INTRAVENOUS
  Filled 2023-07-22 (×2): qty 100

## 2023-07-22 MED ORDER — MAGNESIUM SULFATE 2 GM/50ML IV SOLN
2.0000 g | Freq: Once | INTRAVENOUS | Status: AC
  Administered 2023-07-22: 2 g via INTRAVENOUS
  Filled 2023-07-22: qty 50

## 2023-07-22 MED ORDER — BUSPIRONE HCL 5 MG PO TABS
10.0000 mg | ORAL_TABLET | Freq: Three times a day (TID) | ORAL | Status: DC
  Administered 2023-07-22 – 2023-07-25 (×9): 10 mg via ORAL
  Filled 2023-07-22 (×9): qty 2

## 2023-07-22 MED ORDER — APIXABAN 5 MG PO TABS
5.0000 mg | ORAL_TABLET | Freq: Two times a day (BID) | ORAL | Status: DC
  Administered 2023-07-22 – 2023-07-25 (×7): 5 mg via ORAL
  Filled 2023-07-22 (×7): qty 1

## 2023-07-22 MED ORDER — OXYCODONE HCL 5 MG PO TABS
5.0000 mg | ORAL_TABLET | Freq: Four times a day (QID) | ORAL | Status: AC | PRN
  Administered 2023-07-22 (×2): 5 mg via ORAL
  Filled 2023-07-22 (×2): qty 1

## 2023-07-22 NOTE — Evaluation (Signed)
Physical Therapy Evaluation Patient Details Name: Lawrence [name] MRN: [id number] DOB: 09-07-1955 Today's Date: 07/22/2023  History of Present Illness  67 y.o. male who presents on 07/21/23 with multiple falls. Just went home from same problem 07/17/23.  PMH atrial fibrillation on anticoagulation, prior CVA with residual left-sided weakness, Nov '24 L cerebellar CVA, type 2 diabetes with neuropathy, seizure disorder on antiepileptics, CAD, COPD, OSA, spinal stimulator, bil LE stasis ulcers, history of severe aortic stenosis status post TAVR  Clinical Impression  Pt admitted with above diagnosis. Pt reports that he has had dizziness while up since he went home from the hospital last week. See orthostatic vitals below in comments. Do feel that his BP is contributing somewhat to this feeling and his falls. Pt with generalized weakness as well and needed mod A +2 to come to sitting EOB and was then unable to stand to RW. With stedy pt was able to achieve standing with mod A +2 for power up and fwd wt shift. Pt again attempted standing independently from flaps of stedy but was unable, needed mod A +2. Patient will benefit from continued inpatient follow up therapy, <3 hours/day, pt agreeable.  Pt currently with functional limitations due to the deficits listed below (see PT Problem List). Pt will benefit from acute skilled PT to increase their independence and safety with mobility to allow discharge.           If plan is discharge home, recommend the following: Assistance with cooking/housework;Assist for transportation;Help with stairs or ramp for entrance;Two people to help with walking and/or transfers;A lot of help with bathing/dressing/bathroom   Can travel by private vehicle   No    Equipment Recommendations None recommended by PT  Recommendations for Other Services  OT consult    Functional Status Assessment Patient has had a recent decline in their functional status and demonstrates the  ability to make significant improvements in function in a reasonable and predictable amount of time.     Precautions / Restrictions Precautions Precautions: Fall Precaution Comments: Slight L hemiparesis; reports light headedness over past few weeks that has contributed to falls Required Braces or Orthoses: Other Brace Other Brace: R Neoprene Hinged Knee Brace for pain management and structual alignment. Not used during eval, ask next session if he has it with him Restrictions Weight Bearing Restrictions Per Provider Order: No      Mobility  Bed Mobility Overal bed mobility: Needs Assistance Bed Mobility: Supine to Sit, Sit to Supine     Supine to sit: +2 for physical assistance, Mod assist Sit to supine: Mod assist, +2 for physical assistance   General bed mobility comments: needed increased time to mobilize. Mod A +2 for trunk with coming to sitting and with LE's for return to supine    Transfers Overall transfer level: Needs assistance Equipment used: Rolling walker (2 wheels), Ambulation equipment used Transfers: Sit to/from Stand Sit to Stand: From elevated surface, Mod assist, +2 physical assistance           General transfer comment: pt unable to stand to RW even with mod A +2 given. Stedy obtained and pt able to stand to it with mod A +2 for power up and fwd wt shift. Pt reports he feels weak in UE's and LE's as well as light headed. Pt attempted to stand from flaps of stedy and was again unable until mod A +2 given. Pt deferred transfer to chair today Transfer via Lift Equipment: Stedy  Ambulation/Gait  General Gait Details: unable to step feet in standing today  Stairs            Wheelchair Mobility     Tilt Bed    Modified Rankin (Stroke Patients Only)       Balance Overall balance assessment: Needs assistance, History of Falls Sitting-balance support: No upper extremity supported, Feet supported Sitting balance-Leahy Scale:  Fair Sitting balance - Comments: sitting EOB   Standing balance support: Bilateral upper extremity supported, During functional activity, Reliant on assistive device for balance Standing balance-Leahy Scale: Zero Standing balance comment: completely dependent on UE support as well as external assist                             Pertinent Vitals/Pain Pain Assessment Pain Assessment: No/denies pain    Home Living Family/patient expects to be discharged to:: Private residence Living Arrangements: Spouse/significant other Available Help at Discharge: Family;Available 24 hours/day;Personal care attendant (4 days/week x 4 hours/day) Type of Home: Apartment Home Access: Level entry       Home Layout: One level Home Equipment: Rollator (4 wheels);Electric scooter;Hospital bed;BSC/3in1;Lift chair Additional Comments: Has a PCA 4 days a week for about 4 hours a day (13 hour week); wears CPAP at night, no O2 at home    Prior Function Prior Level of Function : Needs assist       Physical Assist : ADLs (physical)   ADLs (physical): Bathing;Dressing Mobility Comments: Rollator for household mobility (short distances); Art gallery manager in community ADLs Comments: aide assists with bathing/dressing, provides set up A for grooming tasks, light household chores,  utilizes transportation services. uses BSC does not go into bathroom; enjoys going into the community     Extremity/Trunk Assessment   Upper Extremity Assessment Upper Extremity Assessment: Defer to OT evaluation;Generalized weakness    Lower Extremity Assessment Lower Extremity Assessment: Generalized weakness (L weaker than R since CVA)    Cervical / Trunk Assessment Cervical / Trunk Assessment: Other exceptions Cervical / Trunk Exceptions: morbid obesity  Communication   Communication Communication: No apparent difficulties Cueing Techniques: Verbal cues  Cognition Arousal: Alert Behavior During Therapy: WFL  for tasks assessed/performed Overall Cognitive Status: Within Functional Limits for tasks assessed                                          General Comments General comments (skin integrity, edema, etc.): BP supine 124/62, sitting 121/56, standing 117/50, supine 123/79. Pt lightheaded in sitting and standing. SPO2 91-93% on RA. HR in low 80's    Exercises     Assessment/Plan    PT Assessment Patient needs continued PT services  PT Problem List Decreased strength;Decreased activity tolerance;Decreased balance;Decreased mobility;Decreased knowledge of use of DME;Cardiopulmonary status limiting activity;Obesity       PT Treatment Interventions DME instruction;Gait training;Stair training;Functional mobility training;Therapeutic activities;Therapeutic exercise;Balance training;Neuromuscular re-education;Patient/family education    PT Goals (Current goals can be found in the Care Plan section)  Acute Rehab PT Goals Patient Stated Goal: to get better PT Goal Formulation: With patient Time For Goal Achievement: 08/05/23 Potential to Achieve Goals: Fair    Frequency Min 1X/week     Co-evaluation               AM-PAC PT "6 Clicks" Mobility  Outcome Measure Help needed turning from your back to your side  while in a flat bed without using bedrails?: A Lot Help needed moving from lying on your back to sitting on the side of a flat bed without using bedrails?: A Lot Help needed moving to and from a bed to a chair (including a wheelchair)?: Total Help needed standing up from a chair using your arms (e.g., wheelchair or bedside chair)?: Total Help needed to walk in hospital room?: Total   6 Click Score: 7    End of Session   Activity Tolerance: Patient limited by fatigue Patient left: with call bell/phone within reach;in bed;with bed alarm set Nurse Communication: Mobility status;Other (comment) (orthostatic BPs) PT Visit Diagnosis: Other abnormalities of gait  and mobility (R26.89);History of falling (Z91.81);Dizziness and giddiness (R42)    Time: 9629-5284 PT Time Calculation (min) (ACUTE ONLY): 29 min   Charges:   PT Evaluation $PT Eval Moderate Complexity: 1 Mod PT Treatments $Therapeutic Activity: 8-22 mins PT General Charges $$ ACUTE PT VISIT: 1 Visit         Lyanne Co, PT  Acute Rehab Services Secure chat preferred Office [phone]   Lawana Chambers Deano Tomaszewski 07/22/2023, 11:21 AM

## 2023-07-22 NOTE — Progress Notes (Signed)
PROGRESS NOTE    Lawrence [name]  UJW:[id number] DOB: 1956-03-11 DOA: 07/21/2023 PCP: [name], PA-C    Chief Complaint  Patient presents with   Fall    Brief Narrative:   Lawrence [name] is a 67 y.o. male with medical history significant for hypertension, permanent atrial fibrillation on Eliquis, coronary artery disease, HFpEF, valvular disease status post TAVR, recent left cerebellar stroke, type 2 diabetes, seizure disorder, COPD, status post spinal stimulator, recently admitted on 07/14/2023 for recurrent falls, and discharged on 07/17/2023, who presents to the ED with multiple falls at home requiring EMS to come to his house to get him off the floor after his falls.  He finally agreed to come to the ED for further evaluation.   In the ED, no head injuries seen on imaging.  UA negative for pyuria.  Creatinine elevated above baseline and hypokalemia.  EDP requested admission for management of AKI, PT/OT, and possible rehab placement.  Admitted by [facility], hospitalist service.   ED Course: Temperature 97.9.  BP 115/66, pulse 93, respiratory rate 18, O2 saturation 94% on room air.  Lab studies notable for WBC 12.8, hemoglobin 14.9, platelet count 226.  Serum potassium 3.2, glucose 103, BUN 26, creatinine 1.60 from baseline creatinine 0.91, GFR 53.  Troponin 5, CPK 63.  Assessment & Plan:   Principal Problem:   Multiple falls   Multiple falls in the setting of recently diagnosed cerebellar stroke(MRI brain 06/25/2023) PT OT assessment Fall precautions No evidence of infection for TOC consulted to assist with discharge planning   AKI suspect prerenal in setting of dehydration from poor oral intake Continue with IV fluids for now Hold home diuretics Avoid nephrotoxic medications  Hypokalemia Hypomagnesemia -Repleted   Persistent atrial fibrillation on Eliquis Continue with Eliquis Resume beta-blockers for heart rate control   Seizure disorder Resume home  AEDs. Seizure precautions.   Hyperlipidemia Resume home regimen   Chronic HFpEF Euvolemic on exam Hold torsemide for now given AKI and soft blood pressure On IV fluids, monitor closely for volume overload   Iron-deficiency anemia Resume home regimen.   Type 2 diabetes with hyperlipidemia Last hemoglobin A1c 6.5 on 06/26/2023 Resume homeTresiba at a lower dose 30>> 20 units, will add insulin sliding scale   Diabetic polyneuropathy Resume home regimen.   GERD Resume home PPI   Severe morbid obesity BMI 43 Recommend weight loss outpatient with regular physical activity and healthy diet.   Restless leg syndrome -Will start on low-dose Requip  Severe morbid obesity BMI 43 Recommend weight loss outpatient with regular physical activity and healthy diet.  OSA -Continue with CPAP  DVT prophylaxis: Eliquis Code Status: DNR Family Communication: None at bedside Disposition:   Status is: Inpatient    Consultants:  None Subjective:  Still reports generalized weakness, fatigue, reports good appetite  Objective: Vitals:   07/22/23 0302 07/22/23 0303 07/22/23 0400 07/22/23 0823  BP:   (!) 110/56 124/62  Pulse: 91 87 90 89  Resp: 15 13 14 15   Temp:   97.7 F (36.5 C) (!) 97.3 F (36.3 C)  TempSrc:   Axillary Oral  SpO2: (!) 89% 91% (!) 85% 94%  Weight:      Height:        Intake/Output Summary (Last 24 hours) at 07/22/2023 1147 Last data filed at 07/22/2023 0824 Gross per 24 hour  Intake 142.62 ml  Output 1075 ml  Net -932.38 ml   Filed Weights   07/21/23 2335 07/22/23 0246  Weight: [name])  155.1 kg (!) 158.6 kg    Examination:  Awake Alert, Oriented X 3, frail, chronically ill-appearing Symmetrical Chest wall movement, Good air movement bilaterally, CTAB RRR,No Gallops,Rubs or new Murmurs, No Parasternal Heave +ve B.Sounds, Abd Soft, No tenderness, No rebound - guarding or rigidity. No Cyanosis, Clubbing, lower extremity mild edema with small wound in  right shin and left knee area    Data Reviewed: I have personally reviewed following labs and imaging studies  CBC: Recent Labs  Lab 07/16/23 0634 07/21/23 2137 07/21/23 2205 07/22/23 0427  WBC 7.2 12.8*  --  9.3  NEUTROABS  --  10.2*  --   --   HGB 14.4 14.9 15.3 13.4  HCT 44.0 46.4 45.0 40.1  MCV 90.3 90.3  --  88.1  PLT 195 226  --  202    Basic Metabolic Panel: Recent Labs  Lab 07/16/23 0634 07/17/23 0623 07/21/23 2137 07/21/23 2205 07/22/23 0427  NA 134* 139 142 144 138  K 3.5 3.7 3.4* 3.2* 3.2*  CL 105 105 100 100 101  CO2 22 27 29   --  27  GLUCOSE 101* 139* 105* 103* 114*  BUN 15 11 24* 26* 24*  CREATININE 0.85 0.91 1.46* 1.60* 1.16  CALCIUM 8.8* 9.2 9.2  --  8.5*  MG  --   --   --   --  1.5*  PHOS  --   --   --   --  3.2    GFR: Estimated Creatinine Clearance: 99.9 mL/min (by C-G formula based on SCr of 1.16 mg/dL).  Liver Function Tests: Recent Labs  Lab 07/21/23 2137  AST 22  ALT 33  ALKPHOS 71  BILITOT 0.7  PROT 7.1  ALBUMIN 3.9    CBG: Recent Labs  Lab 07/17/23 0626 07/17/23 0820 07/17/23 1226 07/17/23 1613 07/17/23 2125  GLUCAP 144* 151* 213* 173* 146*     No results found for this or any previous visit (from the past 240 hours).       Radiology Studies: DG Chest Port 1 View Result Date: 07/21/2023 CLINICAL DATA:  Weakness. EXAM: PORTABLE CHEST 1 VIEW COMPARISON:  Chest radiograph 07/14/2023. FINDINGS: Low lung volumes accentuate the pulmonary vasculature and cardiomediastinal silhouette. No consolidation or pulmonary edema. No pleural effusion or pneumothorax. IMPRESSION: Low lung volumes without evidence of acute cardiopulmonary disease. Electronically Signed   By: Orvan Falconer M.D.   On: 07/21/2023 21:40   CT Head Wo Contrast Result Date: 07/21/2023 CLINICAL DATA:  Transient ischemic attack (TIA).  Fall. EXAM: CT HEAD WITHOUT CONTRAST TECHNIQUE: Contiguous axial images were obtained from the base of the skull through  the vertex without intravenous contrast. RADIATION DOSE REDUCTION: This exam was performed according to the departmental dose-optimization program which includes automated exposure control, adjustment of the mA and/or kV according to patient size and/or use of iterative reconstruction technique. COMPARISON:  MRI brain 07/14/2023. FINDINGS: Brain: No acute hemorrhage. Unchanged mild chronic small-vessel disease. Cortical gray-white differentiation is otherwise preserved. Prominence of the ventricles and sulci within expected range for age. No hydrocephalus or extra-axial collection. No mass effect or midline shift. Vascular: No hyperdense vessel or unexpected calcification. Skull: No calvarial fracture or suspicious bone lesion. Skull base is unremarkable. Sinuses/Orbits: Mild mucosal disease in the inferior left maxillary sinus and partial opacification of the ethmoid air cells. Orbits are unremarkable. Other: None. IMPRESSION: 1. No acute intracranial abnormality. 2. Unchanged mild chronic small-vessel disease. Electronically Signed   By: Orvan Falconer M.D.   On:  07/21/2023 21:39        Scheduled Meds:  apixaban  5 mg Oral BID   ARIPiprazole  5 mg Oral Daily   atorvastatin  40 mg Oral QHS   bacitracin   Topical BID   busPIRone  10 mg Oral TID   colchicine  0.6 mg Oral Daily   cyanocobalamin  1,000 mcg Oral Daily   fenofibrate  54 mg Oral Daily   fluticasone furoate-vilanterol  1 puff Inhalation Daily   And   umeclidinium bromide  1 puff Inhalation Daily   icosapent Ethyl  2 g Oral BID   insulin degludec  20 Units Subcutaneous Daily   lacosamide  150 mg Oral BID   levETIRAcetam  500 mg Oral BID   metoprolol succinate  50 mg Oral Daily   mirtazapine  15 mg Oral QHS   pantoprazole  40 mg Oral Daily   rOPINIRole  0.25 mg Oral BID   rosuvastatin  40 mg Oral Daily   [START ON 07/23/2023] venlafaxine XR  75 mg Oral Q breakfast   Continuous Infusions:  sodium chloride 50 mL/hr at 07/22/23  0303     LOS: 1 day       Huey Bienenstock, MD [facility]   To contact the attending provider between 7A-7P or the covering provider during after hours 7P-7A, please log into the web site [email or web address] and access using universal [place] password for that web site. If you do not have the password, please call the hospital operator.  07/22/2023, 11:47 AM

## 2023-07-22 NOTE — Evaluation (Signed)
Occupational Therapy Evaluation Patient Details Name: Lawrence [name] MRN: [id number] DOB: [birth year] Today's Date: 07/22/2023   History of Present Illness 67 y.o. male who presents on 07/21/23 with multiple falls. Just went home from same problem 07/17/23.  PMH atrial fibrillation on anticoagulation, prior CVA with residual left-sided weakness, Nov '24 L cerebellar CVA, type 2 diabetes with neuropathy, seizure disorder on antiepileptics, CAD, COPD, OSA, spinal stimulator, bil LE stasis ulcers, history of severe aortic stenosis status post TAVR   Clinical Impression   Pt admitted for above, deferred standing trials during session based on pt needing significant +2 assist with ambulation equipment to stand. Educated pt on seated exercises (see below) but they were halted following an onset of lightheadedness impacting the pt's sitting balance. Pt BP noted to bed within his normal values while in acute stay, reinforced proper inhale/exhale sequence but suspect that pt may not been sequencing it correctly which may have led to dizziness. OT to continue following pt acutely to address deficits and help transition to next level of care. Patient would benefit from post acute skilled rehab facility with <3 hours of therapy and 24/7 support       If plan is discharge home, recommend the following: A lot of help with bathing/dressing/bathroom;Assist for transportation;Two people to help with walking and/or transfers    Functional Status Assessment  Patient has had a recent decline in their functional status and demonstrates the ability to make significant improvements in function in a reasonable and predictable amount of time.  Equipment Recommendations  None recommended by OT (TBD at next level of care)    Recommendations for Other Services       Precautions / Restrictions Precautions Precautions: Fall Precaution Comments: Slight L hemiparesis; reports light headedness over past few weeks that  has contributed to falls Required Braces or Orthoses: Other Brace Other Brace: R Neoprene Hinged Knee Brace for pain management and structual alignment. Not used during eval, ask next session if he has it with him Restrictions Weight Bearing Restrictions Per Provider Order: No      Mobility Bed Mobility Overal bed mobility: Needs Assistance Bed Mobility: Supine to Sit, Sit to Supine     Supine to sit: Supervision Sit to supine: Contact guard assist, HOB elevated, Used rails   General bed mobility comments: Pt scooting laterally with CGA no phsyical assist and pulls himself up in bed.    Transfers                   General transfer comment: deferred      Balance Overall balance assessment: Needs assistance, History of Falls Sitting-balance support: No upper extremity supported, Feet supported Sitting balance-Leahy Scale: Fair Sitting balance - Comments: sitting EOB- some postural sway when feeling light headed. Postural control: Posterior lean     Standing balance comment: defer                           ADL either performed or assessed with clinical judgement   ADL Overall ADL's : Needs assistance/impaired Eating/Feeding: Independent;Sitting   Grooming: Set up;Sitting   Upper Body Bathing: Sitting;Set up   Lower Body Bathing: Sitting/lateral leans;Maximal assistance   Upper Body Dressing : Sitting;Set up   Lower Body Dressing: Total assistance;Sitting/lateral leans   Toilet Transfer: +2 for physical assistance;+2 for safety/equipment;Total assistance Toilet Transfer Details (indicate cue type and reason): Pt not able to stand with PT and +2 assist. Deferred standing trials  Toileting- Clothing Manipulation and Hygiene: Total assistance               Vision         Perception         Praxis         Pertinent Vitals/Pain Pain Assessment Pain Assessment: Faces Faces Pain Scale: Hurts a little bit Pain Location: rt knee Pain  Descriptors / Indicators: Aching Pain Intervention(s): Limited activity within patient's tolerance, Monitored during session     Extremity/Trunk Assessment Upper Extremity Assessment Upper Extremity Assessment: Generalized weakness (Can complete bed mobility with rails but hard time suppporting self for pushoffs. ROM WFL)   Lower Extremity Assessment Lower Extremity Assessment: Generalized weakness (L weaker than R since CVA)   Cervical / Trunk Assessment Cervical / Trunk Assessment: Other exceptions Cervical / Trunk Exceptions: morbid obesity   Communication Communication Communication: No apparent difficulties Cueing Techniques: Verbal cues   Cognition Arousal: Alert Behavior During Therapy: WFL for tasks assessed/performed Overall Cognitive Status: Within Functional Limits for tasks assessed                                       General Comments  Pt c/o lightheadedness during exercises, Bp 120/62(79) seated    Exercises General Exercises - Upper Extremity Shoulder Flexion: AROM, Strengthening, 15 reps, Theraband, Seated Theraband Level (Shoulder Flexion): Level 3 (Green) Shoulder Extension: AROM, Right, 5 reps, Limitations, Strengthening, Theraband Theraband Level (Shoulder Extension): Level 3 (Green) Shoulder Extension Limitations: Pt became light headed, activity halted Shoulder Horizontal ABduction: AROM, 20 reps, Strengthening, Both, Seated, Theraband Theraband Level (Shoulder Horizontal Abduction): Level 3 (Green) Elbow Flexion: AROM, 20 reps, Strengthening, Both, Seated, Theraband Theraband Level (Elbow Flexion): Level 3 (Green) Elbow Extension: AROM, Strengthening, Right, 20 reps, Theraband, Both, Seated Theraband Level (Elbow Extension): Level 3 (Green)   Shoulder Instructions      Home Living Family/patient expects to be discharged to:: Private residence Living Arrangements: Spouse/significant other Available Help at Discharge:  Family;Available 24 hours/day;Personal care attendant (4 days/week x 4 hours/day) Type of Home: Apartment Home Access: Level entry     Home Layout: One level     Bathroom Shower/Tub: Tub/shower unit;Sponge bathes at baseline   Bathroom Toilet: Standard Bathroom Accessibility: Yes   Home Equipment: Rollator (4 wheels);Electric scooter;Hospital bed;BSC/3in1;Lift chair   Additional Comments: Has a PCA 4 days a week for about 4 hours a day (13 hour week); wears CPAP at night, no O2 at home  Lives With: Significant other    Prior Functioning/Environment Prior Level of Function : Needs assist       Physical Assist : ADLs (physical)   ADLs (physical): Bathing;Dressing Mobility Comments: Rollator for household mobility (short distances); Art gallery manager in community ADLs Comments: aide assists with bathing/dressing, provides set up A for grooming tasks, light household chores,  utilizes transportation services. uses BSC does not go into bathroom; enjoys going into the community        OT Problem List: Decreased strength;Impaired balance (sitting and/or standing);Obesity      OT Treatment/Interventions: Self-care/ADL training;Therapeutic exercise;Neuromuscular education;DME and/or AE instruction;Therapeutic activities;Patient/family education;Balance training;Visual/perceptual remediation/compensation    OT Goals(Current goals can be found in the care plan section) Acute Rehab OT Goals Patient Stated Goal: To get stronger OT Goal Formulation: With patient Time For Goal Achievement: 08/05/23 Potential to Achieve Goals: Good ADL Goals Pt Will Transfer to Toilet: with +2 assist;with min assist (  using stedy equipment) Pt/caregiver will Perform Home Exercise Program: Increased strength;Both right and left upper extremity;With theraband;With written HEP provided;Independently Additional ADL Goal #2: Pt will demonstrate proper use of breathing techniques when exercising to reduce  risk/onset of light headedness  OT Frequency: Min 1X/week    Co-evaluation              AM-PAC OT "6 Clicks" Daily Activity     Outcome Measure Help from another person eating meals?: None Help from another person taking care of personal grooming?: A Little Help from another person toileting, which includes using toliet, bedpan, or urinal?: Total Help from another person bathing (including washing, rinsing, drying)?: A Lot Help from another person to put on and taking off regular upper body clothing?: A Little Help from another person to put on and taking off regular lower body clothing?: Total 6 Click Score: 14   End of Session Equipment Utilized During Treatment: Other (comment) (theraband) Nurse Communication: Need for lift equipment (significant assist needed +2 if using stedy)  Activity Tolerance: Patient tolerated treatment well Patient left: in bed;with call bell/phone within reach;with bed alarm set  OT Visit Diagnosis: Unsteadiness on feet (R26.81);Other abnormalities of gait and mobility (R26.89);History of falling (Z91.81);Repeated falls (R29.6)                Time: 4098-1191 OT Time Calculation (min): 32 min Charges:  OT General Charges $OT Visit: 1 Visit OT Evaluation $OT Eval Moderate Complexity: 1 Mod OT Treatments $Therapeutic Exercise: 8-22 mins  07/22/2023  AB, OTR/L  Acute Rehabilitation Services  Office: [phone]   Tristan Schroeder 07/22/2023, 5:29 PM

## 2023-07-22 NOTE — Plan of Care (Signed)

## 2023-07-23 ENCOUNTER — Inpatient Hospital Stay (HOSPITAL_COMMUNITY): Payer: Medicare HMO

## 2023-07-23 DIAGNOSIS — R296 Repeated falls: Secondary | ICD-10-CM | POA: Diagnosis not present

## 2023-07-23 LAB — GLUCOSE, CAPILLARY
Glucose-Capillary: 176 mg/dL — ABNORMAL HIGH (ref 70–99)
Glucose-Capillary: 169 mg/dL — ABNORMAL HIGH (ref 70–99)
Glucose-Capillary: 156 mg/dL — ABNORMAL HIGH (ref 70–99)
Glucose-Capillary: 162 mg/dL — ABNORMAL HIGH (ref 70–99)

## 2023-07-23 LAB — PHOSPHORUS: Phosphorus: 3.1 mg/dL (ref 2.5–4.6)

## 2023-07-23 LAB — IRON AND TIBC
Iron: 53 ug/dL (ref 45–182)
Saturation Ratios: 13 % — ABNORMAL LOW (ref 17.9–39.5)
TIBC: 398 ug/dL (ref 250–450)
UIBC: 345 ug/dL

## 2023-07-23 LAB — BASIC METABOLIC PANEL
Anion gap: 8 (ref 5–15)
BUN: 17 mg/dL (ref 8–23)
CO2: 28 mmol/L (ref 22–32)
Calcium: 9.2 mg/dL (ref 8.9–10.3)
Chloride: 101 mmol/L (ref 98–111)
Creatinine, Ser: 0.99 mg/dL (ref 0.61–1.24)
GFR, Estimated: >60 mL/min (ref >60)
Glucose, Bld: 170 mg/dL — ABNORMAL HIGH (ref 70–99)
Potassium: 3.4 mmol/L — ABNORMAL LOW (ref 3.5–5.1)
Sodium: 137 mmol/L (ref 135–145)

## 2023-07-23 LAB — CBC
HCT: 38.8 % — ABNORMAL LOW (ref 39.0–52.0)
Hemoglobin: 12.7 g/dL — ABNORMAL LOW (ref 13.0–17.0)
MCH: 29.1 pg (ref 26.0–34.0)
MCHC: 32.7 g/dL (ref 30.0–36.0)
MCV: 88.8 fL (ref 80.0–100.0)
Platelets: 191 10*3/uL (ref 150–400)
RBC: 4.37 MIL/uL (ref 4.22–5.81)
RDW: 14.8 % (ref 11.5–15.5)
WBC: 8.5 10*3/uL (ref 4.0–10.5)
nRBC: 0 % (ref 0.0–0.2)

## 2023-07-23 LAB — MAGNESIUM: Magnesium: 1.7 mg/dL (ref 1.7–2.4)

## 2023-07-23 LAB — FERRITIN: Ferritin: 71 ng/mL (ref 24–336)

## 2023-07-23 MED ORDER — LORAZEPAM 2 MG/ML IJ SOLN: 1.0000 mg | Freq: Once | INTRAMUSCULAR | Status: AC | PRN

## 2023-07-23 MED ORDER — CYANOCOBALAMIN 1000 MCG/ML IJ SOLN
1000.0000 ug | Freq: Every day | INTRAMUSCULAR | Status: DC
  Administered 2023-07-23 – 2023-07-25 (×3): 1000 ug via SUBCUTANEOUS
  Filled 2023-07-23 (×3): qty 1

## 2023-07-23 MED ORDER — MAGNESIUM SULFATE 2 GM/50ML IV SOLN
2.0000 g | Freq: Once | INTRAVENOUS | Status: AC
  Administered 2023-07-23: 2 g via INTRAVENOUS
  Filled 2023-07-23: qty 50

## 2023-07-23 MED ORDER — LORAZEPAM 2 MG/ML IJ SOLN
INTRAMUSCULAR | Status: AC
  Administered 2023-07-23: 1 mg via INTRAVENOUS
  Filled 2023-07-23: qty 1

## 2023-07-23 MED ORDER — OXYCODONE HCL 5 MG PO TABS
5.0000 mg | ORAL_TABLET | Freq: Four times a day (QID) | ORAL | Status: AC | PRN
  Administered 2023-07-23 (×2): 5 mg via ORAL
  Filled 2023-07-23 (×2): qty 1

## 2023-07-23 MED ORDER — POTASSIUM CHLORIDE CRYS ER 20 MEQ PO TBCR
40.0000 meq | EXTENDED_RELEASE_TABLET | Freq: Once | ORAL | Status: AC
  Administered 2023-07-23: 40 meq via ORAL
  Filled 2023-07-23: qty 2

## 2023-07-23 MED ORDER — INSULIN ASPART 100 UNIT/ML IJ SOLN
0.0000 [IU] | Freq: Three times a day (TID) | INTRAMUSCULAR | Status: DC
  Administered 2023-07-23: 3 [IU] via SUBCUTANEOUS
  Administered 2023-07-24: 5 [IU] via SUBCUTANEOUS
  Administered 2023-07-24: 3 [IU] via SUBCUTANEOUS
  Administered 2023-07-24: 2 [IU] via SUBCUTANEOUS
  Administered 2023-07-25: 5 [IU] via SUBCUTANEOUS
  Administered 2023-07-25: 3 [IU] via SUBCUTANEOUS

## 2023-07-23 MED ORDER — INSULIN ASPART 100 UNIT/ML IJ SOLN: 0.0000 [IU] | Freq: Every day | INTRAMUSCULAR | Status: DC

## 2023-07-23 MED ORDER — METHOCARBAMOL 500 MG PO TABS
500.0000 mg | ORAL_TABLET | Freq: Three times a day (TID) | ORAL | Status: DC | PRN
  Administered 2023-07-23 – 2023-07-24 (×4): 500 mg via ORAL
  Filled 2023-07-23 (×4): qty 1

## 2023-07-23 MED ORDER — HYDROMORPHONE HCL 1 MG/ML IJ SOLN
0.5000 mg | INTRAMUSCULAR | Status: AC | PRN
  Administered 2023-07-23 (×2): 0.5 mg via INTRAVENOUS
  Filled 2023-07-23 (×2): qty 0.5

## 2023-07-23 NOTE — Plan of Care (Signed)

## 2023-07-23 NOTE — TOC Progression Note (Signed)
Transition of Care [facility]) - Progression Note    Patient Details  Name: Lawrence [name] MRN: [id number] Date of Birth: 06-17-1956  Transition of Care [facility]) CM/SW Contact  Nicanor Bake Phone Number: [phone] 07/23/2023, 11:12 AM  Clinical Narrative: CSW  met with pt and wife, [name] at bedside. Pt and wife stated that they are agreeable with additional rehab therapy. Pt stated that the last SNF he went to, he wishes not to return back to that facility. Pt and wife could not remember the name of the facility but provided directions. CSW explained that SNF process to pt and spouse.   TOC will continue following.          Expected Discharge Plan and Services                                               Social Determinants of Health (SDOH) Interventions SDOH Screenings   Food Insecurity: No Food Insecurity (07/22/2023)  Recent Concern: Food Insecurity - Food Insecurity Present (06/25/2023)  Housing: Unknown (07/22/2023)  Transportation Needs: No Transportation Needs (07/22/2023)  Utilities: Not At Risk (07/22/2023)  Alcohol Screen: Low Risk  (06/08/2021)  Financial Resource Strain: Medium Risk (12/06/2021)   Received from [facility], [facility]  Physical Activity: Unknown (12/06/2021)   Received from [facility], [facility]  Social Connections: Unknown (12/18/2022)   Received from [facility], [facility]  Stress: No Stress Concern Present (01/21/2022)   Received from [facility], [facility]  Recent Concern: Stress - Stress Concern Present (11/19/2021)   Received from [facility]  Tobacco Use: Medium Risk (07/21/2023)    Readmission Risk Interventions    05/08/2023    4:37 PM 01/25/2023    4:23 PM 08/12/2021   10:14 AM  Readmission Risk Prevention Plan  Transportation Screening Complete Complete Complete  PCP or Specialist Appt within 3-5 Days  Complete   HRI or Home Care Consult  Complete   Social Work Consult for Recovery Care  Planning/Counseling  Complete   Palliative Care Screening  Not Applicable   Medication Review Oceanographer) Complete Referral to Pharmacy Complete  PCP or Specialist appointment within 3-5 days of discharge Complete  Complete  HRI or Home Care Consult Complete  Complete  SW Recovery Care/Counseling Consult Complete  Complete  Palliative Care Screening Complete  Not Applicable  Skilled Nursing Facility Not Applicable  Not Applicable

## 2023-07-23 NOTE — Progress Notes (Signed)
Patient refused CPAP for the night  

## 2023-07-23 NOTE — Progress Notes (Signed)
PROGRESS NOTE    Lawrence [name]  UEA:[id number] DOB: 1955-09-02 DOA: 07/21/2023 PCP: [name], PA-C    Chief Complaint  Patient presents with   Fall    Brief Narrative:   Lawrence [name] is a 67 y.o. male with medical history significant for hypertension, permanent atrial fibrillation on Eliquis, coronary artery disease, HFpEF, valvular disease status post TAVR, recent left cerebellar stroke, type 2 diabetes, seizure disorder, COPD, status post spinal stimulator, recently admitted on 07/14/2023 for recurrent falls, and discharged on 07/17/2023, who presents to the ED with multiple falls at home requiring EMS to come to his house to get him off the floor after his falls.  He finally agreed to come to the ED for further evaluation.   In the ED, no head injuries seen on imaging.  UA negative for pyuria.  Creatinine elevated above baseline and hypokalemia.  EDP requested admission for management of AKI, PT/OT, and possible rehab placement.  Admitted by [facility], hospitalist service.   ED Course: Temperature 97.9.  BP 115/66, pulse 93, respiratory rate 18, O2 saturation 94% on room air.  Lab studies notable for WBC 12.8, hemoglobin 14.9, platelet count 226.  Serum potassium 3.2, glucose 103, BUN 26, creatinine 1.60 from baseline creatinine 0.91, GFR 53.  Troponin 5, CPK 63.  Assessment & Plan:   Principal Problem:   Multiple falls   Multiple falls in the setting of recently diagnosed cerebellar stroke(MRI brain 06/25/2023) PT OT assessment, will need SNF placement, TOC consulted Fall precautions No evidence of infection for -report worsening of her balance and gait over last 2 days, I will obtain MRI brain to rule out any acute ischemic event or stroke. -B12 level remains low on p.o. supplements, will start on IM supplement   AKI suspect prerenal in setting of dehydration from poor oral intake IV fluids, renal function has normalized, he is with good oral intake so we will hold on  any further IV fluids, but meanwhile we will keep holding his diuretics. Avoid nephrotoxic medications  Hypokalemia Hypomagnesemia -Repleted   Persistent atrial fibrillation on Eliquis Continue with Eliquis Resume beta-blockers for heart rate control   Seizure disorder Resume home AEDs. Seizure precautions.   Hyperlipidemia Resume home regimen   Chronic HFpEF Euvolemic on exam Hold torsemide for now given AKI and soft blood pressure  monitor closely for volume overload   Iron-deficiency anemia Resume home regimen. -Will check ferritin and iron and if low will start on IV iron   Type 2 diabetes with hyperlipidemia Last hemoglobin A1c 6.5 on 06/26/2023 Resume homeTresiba at a lower dose 30>> 20 units, will add insulin sliding scale   Diabetic polyneuropathy Resume home regimen.   GERD Resume home PPI   Severe morbid obesity BMI 43 Recommend weight loss outpatient with regular physical activity and healthy diet. He is already on Mounjaro   Restless leg syndrome -Will start on low-dose Requip -Will check iron-ferritin level, if low will need to replace -Reports some spasms as well will add Robaxin will give IV magnesium  Severe morbid obesity BMI 43 Recommend weight loss outpatient with regular physical activity and healthy diet.  OSA -Continue with CPAP  B12 deficiency -B12 level is low despite p.o. supplements, will start on Im  DVT prophylaxis: Eliquis Code Status: DNR Family Communication: Significant other at bedside Disposition:   Status is: Inpatient    Consultants:  None Subjective:  He does report right lower extremity spasms, still reports unsteady gait  Objective: Vitals:  07/22/23 1944 07/23/23 0537 07/23/23 0800 07/23/23 0817  BP: 128/68 (!) 103/46 95/73 109/75  Pulse: 89 75 84 75  Resp: 16 19 20 12   Temp: 98 F (36.7 C) 98.1 F (36.7 C) 97.8 F (36.6 C)   TempSrc: Oral Oral Oral   SpO2: 90% 91% 92% 92%  Weight:       Height:        Intake/Output Summary (Last 24 hours) at 07/23/2023 1154 Last data filed at 07/23/2023 1031 Gross per 24 hour  Intake 360 ml  Output 1970 ml  Net -1610 ml   Filed Weights   07/21/23 2335 07/22/23 0246  Weight: (!) 155.1 kg (!) 158.6 kg    Examination:  Awake Alert, Oriented X 3, No new F.N deficits, Normal affect Symmetrical Chest wall movement, Good air movement bilaterally, CTAB RRR,No Gallops,Rubs or new Murmurs, No Parasternal Heave +ve B.Sounds, Abd Soft, No tenderness, No rebound - guarding or rigidity. No Cyanosis, Clubbing or edema, No new Rash or bruise       Data Reviewed: I have personally reviewed following labs and imaging studies  CBC: Recent Labs  Lab 07/21/23 2137 07/21/23 2205 07/22/23 0427 07/23/23 0527  WBC 12.8*  --  9.3 8.5  NEUTROABS 10.2*  --   --   --   HGB 14.9 15.3 13.4 12.7*  HCT 46.4 45.0 40.1 38.8*  MCV 90.3  --  88.1 88.8  PLT 226  --  202 191    Basic Metabolic Panel: Recent Labs  Lab 07/17/23 0623 07/21/23 2137 07/21/23 2205 07/22/23 0427 07/23/23 0527  NA 139 142 144 138 137  K 3.7 3.4* 3.2* 3.2* 3.4*  CL 105 100 100 101 101  CO2 27 29  --  27 28  GLUCOSE 139* 105* 103* 114* 170*  BUN 11 24* 26* 24* 17  CREATININE 0.91 1.46* 1.60* 1.16 0.99  CALCIUM 9.2 9.2  --  8.5* 9.2  MG  --   --   --  1.5* 1.7  PHOS  --   --   --  3.2 3.1    GFR: Estimated Creatinine Clearance: 117.1 mL/min (by C-G formula based on SCr of 0.99 mg/dL).  Liver Function Tests: Recent Labs  Lab 07/21/23 2137  AST 22  ALT 33  ALKPHOS 71  BILITOT 0.7  PROT 7.1  ALBUMIN 3.9    CBG: Recent Labs  Lab 07/17/23 1226 07/17/23 1613 07/17/23 2125 07/22/23 2115 07/23/23 0906  GLUCAP 213* 173* 146* 153* 176*     No results found for this or any previous visit (from the past 240 hours).       Radiology Studies: DG Chest Port 1 View Result Date: 07/21/2023 CLINICAL DATA:  Weakness. EXAM: PORTABLE CHEST 1 VIEW  COMPARISON:  Chest radiograph 07/14/2023. FINDINGS: Low lung volumes accentuate the pulmonary vasculature and cardiomediastinal silhouette. No consolidation or pulmonary edema. No pleural effusion or pneumothorax. IMPRESSION: Low lung volumes without evidence of acute cardiopulmonary disease. Electronically Signed   By: Orvan Falconer M.D.   On: 07/21/2023 21:40   CT Head Wo Contrast Result Date: 07/21/2023 CLINICAL DATA:  Transient ischemic attack (TIA).  Fall. EXAM: CT HEAD WITHOUT CONTRAST TECHNIQUE: Contiguous axial images were obtained from the base of the skull through the vertex without intravenous contrast. RADIATION DOSE REDUCTION: This exam was performed according to the departmental dose-optimization program which includes automated exposure control, adjustment of the mA and/or kV according to patient size and/or use of iterative reconstruction technique. COMPARISON:  MRI brain 07/14/2023. FINDINGS: Brain: No acute hemorrhage. Unchanged mild chronic small-vessel disease. Cortical gray-white differentiation is otherwise preserved. Prominence of the ventricles and sulci within expected range for age. No hydrocephalus or extra-axial collection. No mass effect or midline shift. Vascular: No hyperdense vessel or unexpected calcification. Skull: No calvarial fracture or suspicious bone lesion. Skull base is unremarkable. Sinuses/Orbits: Mild mucosal disease in the inferior left maxillary sinus and partial opacification of the ethmoid air cells. Orbits are unremarkable. Other: None. IMPRESSION: 1. No acute intracranial abnormality. 2. Unchanged mild chronic small-vessel disease. Electronically Signed   By: Orvan Falconer M.D.   On: 07/21/2023 21:39        Scheduled Meds:  apixaban  5 mg Oral BID   ARIPiprazole  5 mg Oral Daily   bacitracin   Topical BID   busPIRone  10 mg Oral TID   colchicine  0.6 mg Oral Daily   cyanocobalamin  1,000 mcg Oral Daily   fenofibrate  54 mg Oral Daily    fluticasone furoate-vilanterol  1 puff Inhalation Daily   And   umeclidinium bromide  1 puff Inhalation Daily   icosapent Ethyl  2 g Oral BID   insulin aspart  0-15 Units Subcutaneous TID WC   insulin aspart  0-5 Units Subcutaneous QHS   insulin degludec  20 Units Subcutaneous Daily   lacosamide  150 mg Oral BID   levETIRAcetam  500 mg Oral BID   metoprolol succinate  50 mg Oral Daily   mirtazapine  15 mg Oral QHS   pantoprazole  40 mg Oral Daily   potassium chloride  40 mEq Oral Once   rOPINIRole  0.25 mg Oral BID   rosuvastatin  40 mg Oral Daily   venlafaxine XR  75 mg Oral Q breakfast   Continuous Infusions:  magnesium sulfate bolus IVPB       LOS: 2 days       Huey Bienenstock, MD [facility]   To contact the attending provider between 7A-7P or the covering provider during after hours 7P-7A, please log into the web site [email or web address] and access using universal [place] password for that web site. If you do not have the password, please call the hospital operator.  07/23/2023, 11:54 AM

## 2023-07-24 ENCOUNTER — Inpatient Hospital Stay (HOSPITAL_COMMUNITY): Payer: Medicare HMO

## 2023-07-24 ENCOUNTER — Other Ambulatory Visit (HOSPITAL_COMMUNITY): Payer: Medicare HMO

## 2023-07-24 DIAGNOSIS — N179 Acute kidney failure, unspecified: Secondary | ICD-10-CM | POA: Diagnosis not present

## 2023-07-24 DIAGNOSIS — R296 Repeated falls: Secondary | ICD-10-CM | POA: Diagnosis not present

## 2023-07-24 LAB — GLUCOSE, CAPILLARY
Glucose-Capillary: 250 mg/dL — ABNORMAL HIGH (ref 70–99)
Glucose-Capillary: 146 mg/dL — ABNORMAL HIGH (ref 70–99)
Glucose-Capillary: 167 mg/dL — ABNORMAL HIGH (ref 70–99)
Glucose-Capillary: 168 mg/dL — ABNORMAL HIGH (ref 70–99)

## 2023-07-24 LAB — BASIC METABOLIC PANEL
Anion gap: 6 (ref 5–15)
BUN: 13 mg/dL (ref 8–23)
CO2: 24 mmol/L (ref 22–32)
Calcium: 9.2 mg/dL (ref 8.9–10.3)
Chloride: 107 mmol/L (ref 98–111)
Creatinine, Ser: 0.87 mg/dL (ref 0.61–1.24)
GFR, Estimated: >60 mL/min (ref >60)
Glucose, Bld: 145 mg/dL — ABNORMAL HIGH (ref 70–99)
Potassium: 3.6 mmol/L (ref 3.5–5.1)
Sodium: 137 mmol/L (ref 135–145)

## 2023-07-24 LAB — CBC
HCT: 41 % (ref 39.0–52.0)
Hemoglobin: 13.5 g/dL (ref 13.0–17.0)
MCH: 29 pg (ref 26.0–34.0)
MCHC: 32.9 g/dL (ref 30.0–36.0)
MCV: 88.2 fL (ref 80.0–100.0)
Platelets: 207 10*3/uL (ref 150–400)
RBC: 4.65 MIL/uL (ref 4.22–5.81)
RDW: 14.6 % (ref 11.5–15.5)
WBC: 8.6 10*3/uL (ref 4.0–10.5)
nRBC: 0 % (ref 0.0–0.2)

## 2023-07-24 MED ORDER — FERROUS SULFATE 325 (65 FE) MG PO TABS
325.0000 mg | ORAL_TABLET | Freq: Two times a day (BID) | ORAL | Status: DC
  Administered 2023-07-24 – 2023-07-25 (×3): 325 mg via ORAL
  Filled 2023-07-24 (×3): qty 1

## 2023-07-24 MED ORDER — SENNOSIDES-DOCUSATE SODIUM 8.6-50 MG PO TABS
1.0000 | ORAL_TABLET | Freq: Two times a day (BID) | ORAL | Status: DC
  Administered 2023-07-24 – 2023-07-25 (×3): 1 via ORAL
  Filled 2023-07-24 (×2): qty 1

## 2023-07-24 MED ORDER — INSULIN DEGLUDEC 100 UNIT/ML ~~LOC~~ SOPN
22.0000 [IU] | PEN_INJECTOR | Freq: Every day | SUBCUTANEOUS | Status: DC
Start: 2023-07-25 — End: 2023-07-25
  Administered 2023-07-25: 22 [IU] via SUBCUTANEOUS
  Filled 2023-07-24: qty 3

## 2023-07-24 MED ORDER — HYDROMORPHONE HCL 1 MG/ML IJ SOLN
0.5000 mg | INTRAMUSCULAR | Status: DC | PRN
  Administered 2023-07-24: 0.5 mg via INTRAVENOUS
  Filled 2023-07-24: qty 0.5

## 2023-07-24 MED ORDER — LORAZEPAM 2 MG/ML IJ SOLN
1.0000 mg | Freq: Once | INTRAMUSCULAR | Status: AC | PRN
  Administered 2023-07-24: 1 mg via INTRAVENOUS
  Filled 2023-07-24: qty 1

## 2023-07-24 MED ORDER — TORSEMIDE 20 MG PO TABS
60.0000 mg | ORAL_TABLET | Freq: Two times a day (BID) | ORAL | Status: DC
  Administered 2023-07-24 – 2023-07-25 (×3): 60 mg via ORAL
  Filled 2023-07-24 (×3): qty 3

## 2023-07-24 NOTE — [place] FL2 (Addendum)
[place] MEDICAID FL2 LEVEL OF CARE FORM     IDENTIFICATION  Patient Name: Lawrence [name] Birthdate: [birth year] Sex: male Admission Date (Current Location): 07/21/2023  [facility] and [place] Number:  Producer, television/film/video and Address:  [facility] [place], [address], [place], [place] [address]      Provider Number: [id number]  Attending Physician Name and Address:  Elgergawy, Leana Roe, MD  Relative Name and Phone Number:       Current Level of Care: Hospital Recommended Level of Care: Skilled Nursing Facility Prior Approval Number:    Date Approved/Denied:   PASRR Number: [id number] E expires 08/23/23  Discharge Plan: SNF    Current Diagnoses: Patient Active Problem List   Diagnosis Date Noted   Multiple falls 07/21/2023   Fall 07/15/2023   Acute metabolic encephalopathy 07/14/2023   Obesity, Class III, BMI 40-49.9 (morbid obesity) (HCC) 07/14/2023   History of CVA with residual deficit 07/14/2023   Fall at home, initial encounter 07/14/2023   Gout 07/12/2023   Constipation 07/09/2023   Polyneuropathy 07/04/2023   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   Acute kidney injury superimposed on stage 3a chronic kidney disease (HCC) 07/03/2023   Orthostatic hypotension 07/03/2023   Stage 3a chronic kidney disease (HCC) 06/30/2023   Acute stroke due to occlusion of left cerebellar artery (HCC) 06/28/2023   Type 2 diabetes mellitus with diabetic polyneuropathy, with long-term current use of insulin (HCC) 06/28/2023   CVA (cerebral vascular accident) (HCC) 06/25/2023   Atrial fibrillation, chronic (HCC) 06/25/2023   Acute CVA (cerebrovascular accident) (HCC) 06/25/2023   S/P TAVR (transcatheter aortic valve replacement) 02/07/2023   Acute respiratory failure with hypoxia and hypercapnia (HCC) 01/23/2023   Nail, injury by, initial encounter 12/09/2022   Heart failure (HCC) 10/27/2022   Gout attack 10/27/2022   CHF (congestive heart  failure) (HCC) 10/26/2022   Right sided weakness 05/26/2022   Dysarthria 05/25/2022   Ischemic stroke (HCC) 02/25/2022   Hemiparesis affecting left side as late effect of cerebrovascular accident (CVA) (HCC) 02/25/2022   Durable power of attorney for healthcare exists  02/25/2022   Depression 08/07/2021   Paronychia of great toe, left 07/07/2021   Stroke-like symptoms 04/27/2021   Ambulatory dysfunction 04/27/2021   Left-sided weakness 02/09/2021   Atrial fibrillation with rapid ventricular response (HCC) 02/08/2021   AF (paroxysmal atrial fibrillation) (HCC) 01/25/2021   COPD (chronic obstructive pulmonary disease) (HCC) 01/25/2021   Aortic stenosis 01/21/2021   Acquired dilation of ascending aorta and aortic root (HCC) 01/21/2021   Coronary artery disease due to lipid rich plaque    SVT (supraventricular tachycardia) (HCC)    Seizure disorder (HCC)    Primary hypertension 10/17/2020   Hypokalemia 10/17/2020   Cellulitis of right leg 12/04/2019   Pain due to onychomycosis of toenails of both feet 09/11/2019   Diabetic neuropathy (HCC) 09/11/2019   Diabetes mellitus (HCC) 09/11/2019   Sinus tachycardia 03/07/2019   Chronic venous stasis 03/07/2019   History of anemia 12/26/2018   Anemia 08/24/2018   History of colon polyps 08/24/2018   Do not resuscitate status 08/24/2018   Chronic diastolic CHF (congestive heart failure) (HCC) 08/18/2018   Coronary artery calcification seen on CAT scan    Pure hypercholesterolemia    Chest pain    Shortness of breath    Right foot pain 01/13/2018   Acute on chronic diastolic CHF (congestive heart failure) (HCC) 01/13/2018   Type 2 diabetes mellitus with obesity (HCC) 01/13/2018   Lower  extremity cellulitis 11/07/2017   Primary osteoarthritis, left shoulder 03/05/2017   BPH without obstruction/lower urinary tract symptoms 02/22/2017   Hematuria 02/22/2017   Peripheral neuropathy 02/22/2017   Physical deconditioning 02/22/2017   Class 3  obesity    PTSD (post-traumatic stress disorder)    Anasarca 01/31/2017   Bilateral lower extremity edema 01/31/2017   OSA (obstructive sleep apnea)     Orientation RESPIRATION BLADDER Height & Weight     Time, Self, Situation, Place  Normal, Other (Comment) (Has home cpap) Continent Weight: (!) 349 lb 10.4 oz (158.6 kg) Height:  6\' 2"  (188 cm)  BEHAVIORAL SYMPTOMS/MOOD NEUROLOGICAL BOWEL NUTRITION STATUS      Continent Diet  AMBULATORY STATUS COMMUNICATION OF NEEDS Skin   Extensive Assist Verbally Other (Comment) (wound on pretibial; venous stasis ulcer on leg)                       Personal Care Assistance Level of Assistance  Bathing, Feeding, Dressing Bathing Assistance: Maximum assistance Feeding assistance: Limited assistance Dressing Assistance: Maximum assistance     Functional Limitations Info  Sight Sight Info: Impaired        SPECIAL CARE FACTORS FREQUENCY  PT (By licensed PT), OT (By licensed OT)     PT Frequency: 5x/week OT Frequency: 5x/week            Contractures Contractures Info: Not present    Additional Factors Info  Code Status, Allergies, Psychotropic, Insulin Sliding Scale Code Status Info: DNR Allergies Info: Vancomycin, Niacin And Related, Tubersol (Tuberculin, Ppd), Doxycycline Psychotropic Info: Effexor Insulin Sliding Scale Info: See dc summary       Current Medications (07/24/2023):  This is the current hospital active medication list Current Facility-Administered Medications  Medication Dose Route Frequency Provider Last Rate Last Admin   acetaminophen (TYLENOL) tablet 650 mg  650 mg Oral Q6H PRN Dow Adolph N, DO   650 mg at 07/24/23 1027   apixaban (ELIQUIS) tablet 5 mg  5 mg Oral BID Elgergawy, Leana Roe, MD   5 mg at 07/24/23 0751   ARIPiprazole (ABILIFY) tablet 5 mg  5 mg Oral Daily Dow Adolph N, DO   5 mg at 07/24/23 2536   bacitracin ointment   Topical BID Elgergawy, Leana Roe, MD   Given at 07/24/23 0755    busPIRone (BUSPAR) tablet 10 mg  10 mg Oral TID Elgergawy, Leana Roe, MD   10 mg at 07/24/23 0753   colchicine tablet 0.6 mg  0.6 mg Oral Daily Elgergawy, Leana Roe, MD   0.6 mg at 07/24/23 6440   cyanocobalamin (VITAMIN B12) injection 1,000 mcg  1,000 mcg Subcutaneous Daily Elgergawy, Leana Roe, MD   1,000 mcg at 07/24/23 0753   fenofibrate tablet 54 mg  54 mg Oral Daily Dow Adolph N, DO   54 mg at 07/24/23 0753   fluticasone furoate-vilanterol (BREO ELLIPTA) 200-25 MCG/ACT 1 puff  1 puff Inhalation Daily Elgergawy, Leana Roe, MD   1 puff at 07/24/23 3474   And   umeclidinium bromide (INCRUSE ELLIPTA) 62.5 MCG/ACT 1 puff  1 puff Inhalation Daily Elgergawy, Leana Roe, MD   1 puff at 07/24/23 0804   HYDROmorphone (DILAUDID) injection 0.5 mg  0.5 mg Intravenous Q4H PRN Opyd, Lavone Neri, MD   0.5 mg at 07/24/23 0451   icosapent Ethyl (VASCEPA) 1 g capsule 2 g  2 g Oral BID Dow Adolph N, DO   2 g at 07/24/23 0753   insulin aspart (  novoLOG) injection 0-15 Units  0-15 Units Subcutaneous TID WC Elgergawy, Leana Roe, MD   5 Units at 07/24/23 0754   insulin aspart (novoLOG) injection 0-5 Units  0-5 Units Subcutaneous QHS Elgergawy, Leana Roe, MD       insulin degludec (TRESIBA) 100 UNIT/ML FlexTouch Pen 20 Units  20 Units Subcutaneous Daily Elgergawy, Leana Roe, MD   20 Units at 07/24/23 0754   lacosamide (VIMPAT) tablet 150 mg  150 mg Oral BID Darlin Drop, DO   150 mg at 07/24/23 2841   levETIRAcetam (KEPPRA) tablet 500 mg  500 mg Oral BID Dow Adolph N, DO   500 mg at 07/24/23 3244   melatonin tablet 5 mg  5 mg Oral QHS PRN Dow Adolph N, DO   5 mg at 07/24/23 0158   methocarbamol (ROBAXIN) tablet 500 mg  500 mg Oral Q8H PRN Elgergawy, Leana Roe, MD   500 mg at 07/24/23 0752   metoprolol succinate (TOPROL-XL) 24 hr tablet 50 mg  50 mg Oral Daily Elgergawy, Leana Roe, MD   50 mg at 07/24/23 0752   mirtazapine (REMERON) tablet 15 mg  15 mg Oral QHS Elgergawy, Leana Roe, MD   15 mg at 07/23/23 2042    pantoprazole (PROTONIX) EC tablet 40 mg  40 mg Oral Daily Dow Adolph N, DO   40 mg at 07/24/23 0751   polyethylene glycol (MIRALAX / GLYCOLAX) packet 17 g  17 g Oral Daily PRN Dow Adolph N, DO       prochlorperazine (COMPAZINE) injection 5 mg  5 mg Intravenous Q6H PRN Hall, Carole N, DO       rOPINIRole (REQUIP) tablet 0.25 mg  0.25 mg Oral BID Elgergawy, Leana Roe, MD   0.25 mg at 07/24/23 0751   rosuvastatin (CRESTOR) tablet 40 mg  40 mg Oral Daily Dow Adolph N, DO   40 mg at 07/24/23 0751   venlafaxine XR (EFFEXOR-XR) 24 hr capsule 75 mg  75 mg Oral Q breakfast Elgergawy, Leana Roe, MD   75 mg at 07/24/23 0102     Discharge Medications: Please see discharge summary for a list of discharge medications.  Relevant Imaging Results:  Relevant Lab Results:   Additional Information SS#: [id number]. Weighs [id number] [phone])  Mearl Latin, LCSW

## 2023-07-24 NOTE — Progress Notes (Signed)
Patient refused CPAP for the night  

## 2023-07-24 NOTE — Plan of Care (Signed)

## 2023-07-24 NOTE — Progress Notes (Signed)
PROGRESS NOTE    Lawrence [name]  WGN:[id number] DOB: 1956-01-06 DOA: 07/21/2023 PCP: [name], PA-C    Chief Complaint  Patient presents with   Fall    Brief Narrative:   Lawrence [name] is a 67 y.o. male with medical history significant for hypertension, permanent atrial fibrillation on Eliquis, coronary artery disease, HFpEF, valvular disease status post TAVR, recent left cerebellar stroke, type 2 diabetes, seizure disorder, COPD, status post spinal stimulator, recently admitted on 07/14/2023 for recurrent falls, and discharged on 07/17/2023, who presents to the ED with multiple falls at home requiring EMS to come to his house to get him off the floor after his falls.  He finally agreed to come to the ED for further evaluation.   In the ED, no head injuries seen on imaging.  UA negative for pyuria.  Creatinine elevated above baseline and hypokalemia.  EDP requested admission for management of AKI, PT/OT, and possible rehab placement.  Admitted by [facility], hospitalist service.   ED Course: Temperature 97.9.  BP 115/66, pulse 93, respiratory rate 18, O2 saturation 94% on room air.  Lab studies notable for WBC 12.8, hemoglobin 14.9, platelet count 226.  Serum potassium 3.2, glucose 103, BUN 26, creatinine 1.60 from baseline creatinine 0.91, GFR 53.  Troponin 5, CPK 63.  Assessment & Plan:   Principal Problem:   Multiple falls   Multiple falls in the setting of recently diagnosed cerebellar stroke(MRI brain 06/25/2023) PT OT assessment, will need SNF placement, TOC consulted Fall precautions No evidence of infection for -report worsening of her balance and gait over last 2 days, I will obtain MRI brain to rule out any acute ischemic event or stroke.,  Patient with spinal cord stimulator, could not be performed over the weekend, should be performed today. -B12 level remains low on p.o. supplements, will start on IM supplement, likely will need IM supplement on discharge.   AKI  suspect prerenal in setting of dehydration from poor oral intake -Resolved with IV fluids and holding his home diuresis, now blood pressure and renal function has normalized, and started to develop some lower extremity edema, so we will resume on torsemide 60 mg p.o. twice daily (home dose 120 mg p.o. twice daily)  Avoid nephrotoxic medications  Hypokalemia Hypomagnesemia -Repleted   Persistent atrial fibrillation on Eliquis Continue with Eliquis Resume beta-blockers for heart rate control   Seizure disorder Resume home AEDs. Seizure precautions.   Hyperlipidemia Resume home regimen   Chronic HFpEF Euvolemic on exam Hold torsemide for now given AKI and soft blood pressure  monitor closely for volume overload   Iron-deficiency anemia Resume home regimen. -Will check ferritin and iron and if low will start on IV iron   Type 2 diabetes with hyperlipidemia Last hemoglobin A1c 6.5 on 06/26/2023 Resume homeTresiba at a lower dose 30>> 20 units, will add insulin sliding scale   Diabetic polyneuropathy Resume home regimen.   GERD Resume home PPI   Severe morbid obesity BMI 43 Recommend weight loss outpatient with regular physical activity and healthy diet. He is already on Mounjaro   Restless leg syndrome -Will start on low-dose Requip -Iron and ferritin are borderline low, will continue with iron supplements  -Continue with as needed   Severe morbid obesity BMI 43 Recommend weight loss outpatient with regular physical activity and healthy diet.  OSA -Continue with CPAP, unable to tolerate hospital CPAP machine, will request significant other to bring  B12 deficiency -B12 level is low despite p.o. supplements, currently  on IM supplements  DVT prophylaxis: Eliquis Code Status: DNR Family Communication: None at bedside today Disposition: Will need SNF placement.  Status is: Inpatient    Consultants:  None Subjective:  Patient reports he is unable to  tolerate hospital CPAP machines, he will request his significant other to bring his machine from home.  Objective: Vitals:   07/24/23 0007 07/24/23 0403 07/24/23 0752 07/24/23 0804  BP: 128/60 102/65 129/77   Pulse: 92 76 94 98  Resp: 16 18 20 16   Temp: 97.9 F (36.6 C) 97.8 F (36.6 C) 97.8 F (36.6 C)   TempSrc: Oral Oral Oral   SpO2: 91% 92%  93%  Weight:      Height:        Intake/Output Summary (Last 24 hours) at 07/24/2023 1134 Last data filed at 07/23/2023 2000 Gross per 24 hour  Intake --  Output 1300 ml  Net -1300 ml   Filed Weights   07/21/23 2335 07/22/23 0246  Weight: (!) 155.1 kg (!) 158.6 kg    Examination:  Awake Alert, oriented x 3, but he is [place] confused, slow to respond today Symmetrical Chest wall movement, Good air movement bilaterally, CTAB RRR,No Gallops,Rubs or new Murmurs, No Parasternal Heave +ve B.Sounds, Abd Soft, No tenderness, No rebound - guarding or rigidity. No Cyanosis, Clubbing +1 lower extremity edema      Data Reviewed: I have personally reviewed following labs and imaging studies  CBC: Recent Labs  Lab 07/21/23 2137 07/21/23 2205 07/22/23 0427 07/23/23 0527 07/24/23 0416  WBC 12.8*  --  9.3 8.5 8.6  NEUTROABS 10.2*  --   --   --   --   HGB 14.9 15.3 13.4 12.7* 13.5  HCT 46.4 45.0 40.1 38.8* 41.0  MCV 90.3  --  88.1 88.8 88.2  PLT 226  --  202 191 207    Basic Metabolic Panel: Recent Labs  Lab 07/21/23 2137 07/21/23 2205 07/22/23 0427 07/23/23 0527 07/24/23 0416  NA 142 144 138 137 137  K 3.4* 3.2* 3.2* 3.4* 3.6  CL 100 100 101 101 107  CO2 29  --  27 28 24   GLUCOSE 105* 103* 114* 170* 145*  BUN 24* 26* 24* 17 13  CREATININE 1.46* 1.60* 1.16 0.99 0.87  CALCIUM 9.2  --  8.5* 9.2 9.2  MG  --   --  1.5* 1.7  --   PHOS  --   --  3.2 3.1  --     GFR: Estimated Creatinine Clearance: 133.3 mL/min (by C-G formula based on SCr of 0.87 mg/dL).  Liver Function Tests: Recent Labs  Lab 07/21/23 2137  AST  22  ALT 33  ALKPHOS 71  BILITOT 0.7  PROT 7.1  ALBUMIN 3.9    CBG: Recent Labs  Lab 07/23/23 0906 07/23/23 1158 07/23/23 1624 07/23/23 2118 07/24/23 0748  GLUCAP 176* 169* 156* 162* 250*     No results found for this or any previous visit (from the past 240 hours).       Radiology Studies: No results found.       Scheduled Meds:  apixaban  5 mg Oral BID   ARIPiprazole  5 mg Oral Daily   bacitracin   Topical BID   busPIRone  10 mg Oral TID   colchicine  0.6 mg Oral Daily   cyanocobalamin  1,000 mcg Subcutaneous Daily   fenofibrate  54 mg Oral Daily   ferrous sulfate  325 mg Oral BID WC  fluticasone furoate-vilanterol  1 puff Inhalation Daily   And   umeclidinium bromide  1 puff Inhalation Daily   icosapent Ethyl  2 g Oral BID   insulin aspart  0-15 Units Subcutaneous TID WC   insulin aspart  0-5 Units Subcutaneous QHS   insulin degludec  20 Units Subcutaneous Daily   lacosamide  150 mg Oral BID   levETIRAcetam  500 mg Oral BID   metoprolol succinate  50 mg Oral Daily   mirtazapine  15 mg Oral QHS   pantoprazole  40 mg Oral Daily   rOPINIRole  0.25 mg Oral BID   rosuvastatin  40 mg Oral Daily   senna-docusate  1 tablet Oral BID   venlafaxine XR  75 mg Oral Q breakfast   Continuous Infusions:     LOS: 3 days       Huey Bienenstock, MD [facility]   To contact the attending provider between 7A-7P or the covering provider during after hours 7P-7A, please log into the web site [email or web address] and access using universal [place] password for that web site. If you do not have the password, please call the hospital operator.  07/24/2023, 11:34 AM

## 2023-07-24 NOTE — TOC Progression Note (Addendum)
Transition of Care [facility]) - Progression Note    Patient Details  Name: Lawrence [name] MRN: [id number] Date of Birth: 1955/08/20  Transition of Care [facility]) CM/SW Contact  Mearl Latin, LCSW Phone Number: 07/24/2023, 12:02 PM  Clinical Narrative:    12pm-CSW met with patient and provided SNF bed offers. He prefers [facility] due to [place] location. He provided permission for CSW to update his significant other as well. CSW spoke with [name] and she reported agreement with plan. CSW provided her with contact info for [facility] [place] for caregiver support. CSW made [facility] aware of the bed request (patient will need a Bariatric bed). Significant other will bring patient's home CPAP to the hospital and SNF.   Pasrr pending; clinicals uploaded.   2:08 PM-Pasrr received and placed on Fl2.   Expected Discharge Plan: Skilled Nursing Facility Barriers to Discharge: Continued Medical Work up, English as a second language teacher  Expected Discharge Plan and Services In-house Referral: Clinical Social Work   Post Acute Care Choice: Skilled Nursing Facility Living arrangements for the past 2 months: Single Family Home                                       Social Determinants of Health (SDOH) Interventions SDOH Screenings   Food Insecurity: No Food Insecurity (07/22/2023)  Recent Concern: Food Insecurity - Food Insecurity Present (06/25/2023)  Housing: Unknown (07/22/2023)  Transportation Needs: No Transportation Needs (07/22/2023)  Utilities: Not At Risk (07/22/2023)  Alcohol Screen: Low Risk  (06/08/2021)  Financial Resource Strain: Medium Risk (12/06/2021)   Received from [facility], [facility]  Physical Activity: Unknown (12/06/2021)   Received from [facility], [facility]  Social Connections: Unknown (12/18/2022)   Received from [facility], [facility]  Stress: No Stress Concern Present (01/21/2022)   Received from [facility], [facility]  Recent  Concern: Stress - Stress Concern Present (11/19/2021)   Received from [facility]  Tobacco Use: Medium Risk (07/21/2023)    Readmission Risk Interventions    07/24/2023   11:56 AM 05/08/2023    4:37 PM 01/25/2023    4:23 PM  Readmission Risk Prevention Plan  Transportation Screening Complete Complete Complete  PCP or Specialist Appt within 3-5 Days   Complete  HRI or Home Care Consult   Complete  Social Work Consult for Recovery Care Planning/Counseling   Complete  Palliative Care Screening   Not Applicable  Medication Review Oceanographer) Complete Complete Referral to Pharmacy  PCP or Specialist appointment within 3-5 days of discharge Complete Complete   HRI or Home Care Consult Complete Complete   SW Recovery Care/Counseling Consult Complete Complete   Palliative Care Screening Not Applicable Complete   Skilled Nursing Facility Complete Not Applicable

## 2023-07-25 ENCOUNTER — Inpatient Hospital Stay (HOSPITAL_COMMUNITY): Payer: Medicare HMO

## 2023-07-25 DIAGNOSIS — E119 Type 2 diabetes mellitus without complications: Secondary | ICD-10-CM | POA: Diagnosis not present

## 2023-07-25 DIAGNOSIS — I69811 Memory deficit following other cerebrovascular disease: Secondary | ICD-10-CM | POA: Diagnosis not present

## 2023-07-25 DIAGNOSIS — N1831 Chronic kidney disease, stage 3a: Secondary | ICD-10-CM | POA: Diagnosis not present

## 2023-07-25 DIAGNOSIS — I639 Cerebral infarction, unspecified: Secondary | ICD-10-CM | POA: Diagnosis not present

## 2023-07-25 DIAGNOSIS — E1142 Type 2 diabetes mellitus with diabetic polyneuropathy: Secondary | ICD-10-CM | POA: Diagnosis not present

## 2023-07-25 DIAGNOSIS — E1169 Type 2 diabetes mellitus with other specified complication: Secondary | ICD-10-CM | POA: Diagnosis not present

## 2023-07-25 DIAGNOSIS — G4730 Sleep apnea, unspecified: Secondary | ICD-10-CM | POA: Diagnosis not present

## 2023-07-25 DIAGNOSIS — R197 Diarrhea, unspecified: Secondary | ICD-10-CM | POA: Diagnosis not present

## 2023-07-25 DIAGNOSIS — I959 Hypotension, unspecified: Secondary | ICD-10-CM | POA: Diagnosis not present

## 2023-07-25 DIAGNOSIS — I10 Essential (primary) hypertension: Secondary | ICD-10-CM | POA: Diagnosis not present

## 2023-07-25 DIAGNOSIS — F331 Major depressive disorder, recurrent, moderate: Secondary | ICD-10-CM | POA: Diagnosis not present

## 2023-07-25 DIAGNOSIS — Z8673 Personal history of transient ischemic attack (TIA), and cerebral infarction without residual deficits: Secondary | ICD-10-CM | POA: Diagnosis not present

## 2023-07-25 DIAGNOSIS — R195 Other fecal abnormalities: Secondary | ICD-10-CM | POA: Diagnosis not present

## 2023-07-25 DIAGNOSIS — R262 Difficulty in walking, not elsewhere classified: Secondary | ICD-10-CM | POA: Diagnosis not present

## 2023-07-25 DIAGNOSIS — J449 Chronic obstructive pulmonary disease, unspecified: Secondary | ICD-10-CM | POA: Diagnosis not present

## 2023-07-25 DIAGNOSIS — I63542 Cerebral infarction due to unspecified occlusion or stenosis of left cerebellar artery: Secondary | ICD-10-CM | POA: Diagnosis not present

## 2023-07-25 DIAGNOSIS — I5030 Unspecified diastolic (congestive) heart failure: Secondary | ICD-10-CM | POA: Diagnosis not present

## 2023-07-25 DIAGNOSIS — M6281 Muscle weakness (generalized): Secondary | ICD-10-CM | POA: Diagnosis not present

## 2023-07-25 DIAGNOSIS — N179 Acute kidney failure, unspecified: Secondary | ICD-10-CM | POA: Diagnosis not present

## 2023-07-25 DIAGNOSIS — M79671 Pain in right foot: Secondary | ICD-10-CM | POA: Diagnosis not present

## 2023-07-25 DIAGNOSIS — R0902 Hypoxemia: Secondary | ICD-10-CM | POA: Diagnosis not present

## 2023-07-25 DIAGNOSIS — E569 Vitamin deficiency, unspecified: Secondary | ICD-10-CM | POA: Diagnosis not present

## 2023-07-25 DIAGNOSIS — I4821 Permanent atrial fibrillation: Secondary | ICD-10-CM | POA: Diagnosis not present

## 2023-07-25 DIAGNOSIS — M109 Gout, unspecified: Secondary | ICD-10-CM | POA: Diagnosis not present

## 2023-07-25 DIAGNOSIS — Z9189 Other specified personal risk factors, not elsewhere classified: Secondary | ICD-10-CM | POA: Diagnosis not present

## 2023-07-25 DIAGNOSIS — R404 Transient alteration of awareness: Secondary | ICD-10-CM | POA: Diagnosis not present

## 2023-07-25 DIAGNOSIS — F411 Generalized anxiety disorder: Secondary | ICD-10-CM | POA: Diagnosis not present

## 2023-07-25 DIAGNOSIS — I69322 Dysarthria following cerebral infarction: Secondary | ICD-10-CM | POA: Diagnosis not present

## 2023-07-25 DIAGNOSIS — I5032 Chronic diastolic (congestive) heart failure: Secondary | ICD-10-CM | POA: Diagnosis not present

## 2023-07-25 DIAGNOSIS — E11649 Type 2 diabetes mellitus with hypoglycemia without coma: Secondary | ICD-10-CM | POA: Diagnosis not present

## 2023-07-25 DIAGNOSIS — Z7401 Bed confinement status: Secondary | ICD-10-CM | POA: Diagnosis not present

## 2023-07-25 DIAGNOSIS — R296 Repeated falls: Secondary | ICD-10-CM | POA: Diagnosis not present

## 2023-07-25 LAB — GLUCOSE, CAPILLARY
Glucose-Capillary: 164 mg/dL — ABNORMAL HIGH (ref 70–99)
Glucose-Capillary: 211 mg/dL — ABNORMAL HIGH (ref 70–99)

## 2023-07-25 MED ORDER — METHOCARBAMOL 500 MG PO TABS: 500.0000 mg | ORAL_TABLET | Freq: Three times a day (TID) | ORAL | Status: DC | PRN

## 2023-07-25 MED ORDER — POTASSIUM CHLORIDE CRYS ER 20 MEQ PO TBCR
40.0000 meq | EXTENDED_RELEASE_TABLET | Freq: Once | ORAL | Status: AC
  Administered 2023-07-25: 40 meq via ORAL
  Filled 2023-07-25: qty 2

## 2023-07-25 MED ORDER — ALLOPURINOL 100 MG PO TABS: 100.0000 mg | ORAL_TABLET | Freq: Every day | ORAL | Status: AC

## 2023-07-25 MED ORDER — BISACODYL 5 MG PO TBEC: 10.0000 mg | DELAYED_RELEASE_TABLET | Freq: Every day | ORAL | Status: DC | PRN

## 2023-07-25 MED ORDER — INSULIN ASPART 100 UNIT/ML IJ SOLN: 0.0000 [IU] | Freq: Three times a day (TID) | INTRAMUSCULAR | Status: DC

## 2023-07-25 MED ORDER — INSULIN ASPART 100 UNIT/ML IJ SOLN: 0.0000 [IU] | Freq: Every day | INTRAMUSCULAR | 11 refills | Status: DC

## 2023-07-25 MED ORDER — PREGABALIN 75 MG PO CAPS: 75.0000 mg | ORAL_CAPSULE | Freq: Two times a day (BID) | ORAL | 0 refills | Status: DC

## 2023-07-25 MED ORDER — ROPINIROLE HCL 0.25 MG PO TABS: 0.2500 mg | ORAL_TABLET | Freq: Two times a day (BID) | ORAL | Status: AC

## 2023-07-25 MED ORDER — SENNOSIDES-DOCUSATE SODIUM 8.6-50 MG PO TABS: 2.0000 | ORAL_TABLET | Freq: Two times a day (BID) | ORAL | Status: DC

## 2023-07-25 MED ORDER — VENLAFAXINE HCL ER 75 MG PO CP24: 150.0000 mg | ORAL_CAPSULE | Freq: Every day | ORAL | Status: DC

## 2023-07-25 MED ORDER — BISACODYL 5 MG PO TBEC
10.0000 mg | DELAYED_RELEASE_TABLET | Freq: Once | ORAL | Status: AC
  Administered 2023-07-25: 10 mg via ORAL
  Filled 2023-07-25: qty 2

## 2023-07-25 MED ORDER — SENNOSIDES-DOCUSATE SODIUM 8.6-50 MG PO TABS
1.0000 | ORAL_TABLET | Freq: Once | ORAL | Status: AC
  Administered 2023-07-25: 1 via ORAL
  Filled 2023-07-25: qty 1

## 2023-07-25 MED ORDER — POTASSIUM CHLORIDE CRYS ER 20 MEQ PO TBCR: 40.0000 meq | EXTENDED_RELEASE_TABLET | Freq: Every day | ORAL | Status: DC

## 2023-07-25 MED ORDER — POLYETHYLENE GLYCOL 3350 17 G PO PACK
34.0000 g | PACK | Freq: Two times a day (BID) | ORAL | Status: DC
  Administered 2023-07-25: 34 g via ORAL
  Filled 2023-07-25: qty 2

## 2023-07-25 MED ORDER — POLYETHYLENE GLYCOL 3350 17 G PO PACK
34.0000 g | PACK | Freq: Once | ORAL | Status: AC
  Administered 2023-07-25: 34 g via ORAL
  Filled 2023-07-25: qty 2

## 2023-07-25 MED ORDER — TORSEMIDE 20 MG PO TABS: 60.0000 mg | ORAL_TABLET | Freq: Two times a day (BID) | ORAL | Status: DC

## 2023-07-25 MED ORDER — TRESIBA FLEXTOUCH 100 UNIT/ML ~~LOC~~ SOPN: 25.0000 [IU] | PEN_INJECTOR | Freq: Every day | SUBCUTANEOUS | Status: DC

## 2023-07-25 NOTE — TOC Transition Note (Signed)
Transition of Care [facility]) - Discharge Note   Patient Details  Name: Lawrence [name] MRN: [id number] Date of Birth: 1955/08/16  [facility]) CM/SW Contact:  Mearl Latin, LCSW Phone Number: 07/25/2023, 4:16 PM   Clinical Narrative:    Patient will DC to: [facility] Anticipated DC date: 07/25/23 Family notified: Significant other, [name] Transport by: Sharin Mons   Per MD patient ready for DC to [facility]. RN to call report prior to discharge [phone] room 128b). RN, patient, patient's family, and facility notified of DC. Discharge Summary and FL2 sent to facility. DC packet on chart including signed script. Ambulance transport requested for patient.   CSW will sign off for now as social work intervention is no longer needed. Please consult [place] again if new needs arise.     Final next level of care: Skilled Nursing Facility Barriers to Discharge: Barriers Resolved   Patient Goals and CMS Choice Patient states their goals for this hospitalization and ongoing recovery are:: Rehab CMS Medicare.gov Compare Post Acute Care list provided to:: Patient Choice offered to / list presented to : Patient (Significant other)      Discharge Placement                       Discharge Plan and Services Additional resources added to the After Visit Summary for   In-house Referral: Clinical Social Work   Post Acute Care Choice: Skilled Nursing Facility                               Social Drivers of Health (SDOH) Interventions SDOH Screenings   Food Insecurity: No Food Insecurity (07/22/2023)  Recent Concern: Food Insecurity - Food Insecurity Present (06/25/2023)  Housing: Unknown (07/22/2023)  Transportation Needs: No Transportation Needs (07/22/2023)  Utilities: Not At Risk (07/22/2023)  Alcohol Screen: Low Risk  (06/08/2021)  Financial Resource Strain: Medium Risk (12/06/2021)   Received [facility]  Physical Activity: Unknown  (12/06/2021)   Received from [facility], [facility]  Social Connections: Unknown (12/18/2022)   Received from [facility], [facility]  Stress: No Stress Concern Present (01/21/2022)   Received from [facility], [facility]  Recent Concern: Stress - Stress Concern Present (11/19/2021)   Received from [facility]  Tobacco Use: Medium Risk (07/21/2023)     Readmission Risk Interventions    07/24/2023   11:56 AM 05/08/2023    4:37 PM 01/25/2023    4:23 PM  Readmission Risk Prevention Plan  Transportation Screening Complete Complete Complete  PCP or Specialist Appt within 3-5 Days   Complete  HRI or Home Care Consult   Complete  Social Work Consult for Recovery Care Planning/Counseling   Complete  Palliative Care Screening   Not Applicable  Medication Review Oceanographer) Complete Complete Referral to Pharmacy  PCP or Specialist appointment within 3-5 days of discharge Complete Complete   HRI or Home Care Consult Complete Complete   SW Recovery Care/Counseling Consult Complete Complete   Palliative Care Screening Not Applicable Complete   Skilled Nursing Facility Complete Not Applicable

## 2023-07-25 NOTE — Progress Notes (Signed)
Physical Therapy Treatment Patient Details Name: Lawrence [name] MRN: [id number] DOB: July 04, 1956 Today's Date: 07/25/2023   History of Present Illness 67 y.o. male who presents on 07/21/23 with multiple falls. Just went home from same problem 07/17/23.  PMH atrial fibrillation on anticoagulation, prior CVA with residual left-sided weakness, Nov '24 L cerebellar CVA, type 2 diabetes with neuropathy, seizure disorder on antiepileptics, CAD, COPD, OSA, spinal stimulator, bil LE stasis ulcers, history of severe aortic stenosis status post TAVR    PT Comments  Pt received sitting EOB and agreeable to session. Pt demonstrates improved power up to stand, so progressed from stedy to RW. Pt noted to keep B knees straight with increased trunk flexion when sitting, so pt educated on flexing B knees for controlled descent. When pt flexes B knees, he is unable to control descent due to weakness. Pt able to step pivot to recliner with R knee blocked due to instability and hyperextension. Pt reports having a knee brace, however it is not in the room. Pt continues to benefit from PT services to progress toward functional mobility goals.    If plan is discharge home, recommend the following: Assistance with cooking/housework;Assist for transportation;Help with stairs or ramp for entrance;Two people to help with walking and/or transfers;A lot of help with bathing/dressing/bathroom   Can travel by private vehicle     No  Equipment Recommendations  None recommended by PT    Recommendations for Other Services       Precautions / Restrictions Precautions Precautions: Fall Precaution Comments: Slight L hemiparesis; reports light headedness over past few weeks that has contributed to falls Required Braces or Orthoses: Other Brace Other Brace: R Neoprene Hinged Knee Brace for pain management and structual alignment- Not in pt's room Restrictions Weight Bearing Restrictions Per Provider Order: No      Mobility  Bed Mobility               General bed mobility comments: pt sitting EOB upon arrival    Transfers Overall transfer level: Needs assistance Equipment used: Rolling walker (2 wheels), Ambulation equipment used Transfers: Sit to/from Stand, Bed to chair/wheelchair/BSC Sit to Stand: From elevated surface, +2 physical assistance, Min assist   Step pivot transfers: Min assist, +2 physical assistance       General transfer comment: STS from EOB with stedy progressing to RW with min A +2 for power up. Pt able to pivot to recliner with assist for weight shift, balance, and R knee blocking to prevent hyperextension. Increased difficulty advancing LLE and heavy reliance on BUE support Transfer via Lift Equipment: Stedy  Ambulation/Gait             Pre-gait activities: standing marches General Gait Details: deferred due to fatigue and instability      Balance Overall balance assessment: Needs assistance, History of Falls Sitting-balance support: No upper extremity supported, Feet supported Sitting balance-Leahy Scale: Fair Sitting balance - Comments: sitting EOB   Standing balance support: Bilateral upper extremity supported, During functional activity, Reliant on assistive device for balance Standing balance-Leahy Scale: Poor Standing balance comment: with stedy and RW support                            Cognition Arousal: Alert Behavior During Therapy: WFL for tasks assessed/performed Overall Cognitive Status: Within Functional Limits for tasks assessed  Exercises      General Comments        Pertinent Vitals/Pain Pain Assessment Pain Assessment: Faces Faces Pain Scale: Hurts a little bit Pain Location: rt knee Pain Descriptors / Indicators: Aching Pain Intervention(s): Limited activity within patient's tolerance, Monitored during session, Repositioned     PT Goals (current  goals can now be found in the care plan section) Acute Rehab PT Goals Patient Stated Goal: to get better PT Goal Formulation: With patient Time For Goal Achievement: 08/05/23 Progress towards PT goals: Progressing toward goals    Frequency    Min 1X/week       AM-PAC PT "6 Clicks" Mobility   Outcome Measure  Help needed turning from your back to your side while in a flat bed without using bedrails?: A Lot Help needed moving from lying on your back to sitting on the side of a flat bed without using bedrails?: A Lot Help needed moving to and from a bed to a chair (including a wheelchair)?: A Lot Help needed standing up from a chair using your arms (e.g., wheelchair or bedside chair)?: A Lot Help needed to walk in hospital room?: Total Help needed climbing 3-5 steps with a railing? : Total 6 Click Score: 10    End of Session Equipment Utilized During Treatment: Gait belt Activity Tolerance: Patient limited by fatigue;Patient tolerated treatment well Patient left: in chair;with call bell/phone within reach;with chair alarm set Nurse Communication: Mobility status PT Visit Diagnosis: Other abnormalities of gait and mobility (R26.89);History of falling (Z91.81);Dizziness and giddiness (R42)     Time: 5409-8119 PT Time Calculation (min) (ACUTE ONLY): 23 min  Charges:    $Therapeutic Activity: 23-37 mins PT General Charges $$ ACUTE PT VISIT: 1 Visit                     Johny Shock, PTA Acute Rehabilitation Services Secure Chat Preferred  [phone]    Johny Shock 07/25/2023, 11:24 AM

## 2023-07-25 NOTE — Progress Notes (Addendum)
Pt's MRI was not done last night due to battery issue with the spinal stimulator; advised to charge first. At 2344, sent a message via secure chat to MRI that pt is ready and that battery is fully charged; no response. Called MRI 00:51, they said they'll call me back but haven't received a call up to this time. Also, patient had an episode on non-sustained bradycardia going as low as 35bpm but eventually rose to 70s-80s. Also, patient has an episode of confusion and agitation at night time. He usually take off all his chest leads and oxygen sensor. Dangles in bed from time to time and go back to sleep. When asked if he's in pain or something, He answered he's fine. He answered all orientation questions, conversed well and can be redirected. Side rails are up most of the time due to high risk for fall due to multiple falls but he's pulling it down by himself. Bed alarm has been off as well as it keeps on beeping due to his agitation in the bed of frequent turning around the bed. Patient reminded on fall precautions and advised to call whenever he needs help.

## 2023-07-25 NOTE — Care Management Important Message (Signed)
Important Message  Patient Details  Name: Lawrence [name] MRN: [id number] Date of Birth: June 22, 1956   Important Message Given:  Yes - Medicare IM     Dorena Bodo 07/25/2023, 3:53 PM

## 2023-07-25 NOTE — Discharge Instructions (Signed)
Follow with Primary MD SNF physician    Activity: As tolerated with Full fall precautions use walker/cane & assistance as needed   Disposition SNF   Diet: Heart Healthy   For Heart failure patients - Check your Weight same time everyday, if you gain over 2 pounds, or you develop in leg swelling, experience more shortness of breath or chest pain, call your Primary MD immediately. Follow Cardiac Low Salt Diet and 1.5 lit/day fluid restriction.   On your next visit with your primary care physician please Get Medicines reviewed and adjusted.   Please request your Prim.MD to go over all Hospital Tests and Procedure/Radiological results at the follow up, please get all Hospital records sent to your Prim MD by signing hospital release before you go home.   If you experience worsening of your admission symptoms, develop shortness of breath, life threatening emergency, suicidal or homicidal thoughts you must seek medical attention immediately by calling 911 or calling your MD immediately  if symptoms less severe.  You Must read complete instructions/literature along with all the possible adverse reactions/side effects for all the Medicines you take and that have been prescribed to you. Take any new Medicines after you have completely understood and accpet all the possible adverse reactions/side effects.   Do not drive, operating heavy machinery, perform activities at heights, swimming or participation in water activities or provide baby sitting services if your were admitted for syncope or siezures until you have seen by Primary MD or a Neurologist and advised to do so again.  Do not drive when taking Pain medications.    Do not take more than prescribed Pain, Sleep and Anxiety Medications  Special Instructions: If you have smoked or chewed Tobacco  in the last 2 yrs please stop smoking, stop any regular Alcohol  and or any Recreational drug use.  Wear Seat belts while driving.   Please  note  You were cared for by a hospitalist during your hospital stay. If you have any questions about your discharge medications or the care you received while you were in the hospital after you are discharged, you can call the unit and asked to speak with the hospitalist on call if the hospitalist that took care of you is not available. Once you are discharged, your primary care physician will handle any further medical issues. Please note that NO REFILLS for any discharge medications will be authorized once you are discharged, as it is imperative that you return to your primary care physician (or establish a relationship with a primary care physician if you do not have one) for your aftercare needs so that they can reassess your need for medications and monitor your lab values.

## 2023-07-25 NOTE — TOC Progression Note (Addendum)
Transition of Care [facility]) - Progression Note    Patient Details  Name: Lawrence [name] MRN: [id number] Date of Birth: 1955/08/10  Transition of Care [facility] CM/SW Contact  Mearl Latin, LCSW Phone Number: 07/25/2023, 11:58 AM  Clinical Narrative:    12pm-CSW submitted and received insurance approval for [employer], Ref# [id number], effective 07/25/2023-07/27/2023. CSW left voicemail for patient's significant other to make her aware.   2:45pm-CSW updated [name]. She brought his CPAP last night.   Expected Discharge Plan: Skilled Nursing Facility Barriers to Discharge: Barriers Resolved  Expected Discharge Plan and Services In-house Referral: Clinical Social Work   Post Acute Care Choice: Skilled Nursing Facility Living arrangements for the past 2 months: Single Family Home                                       Social Determinants of Health (SDOH) Interventions SDOH Screenings   Food Insecurity: No Food Insecurity (07/22/2023)  Recent Concern: Food Insecurity - Food Insecurity Present (06/25/2023)  Housing: Unknown (07/22/2023)  Transportation Needs: No Transportation Needs (07/22/2023)  Utilities: Not At Risk (07/22/2023)  Alcohol Screen: Low Risk  (06/08/2021)  Financial Resource Strain: Medium Risk (12/06/2021)   Received from [facility], [facility]  Physical Activity: Unknown (12/06/2021)   Received from [facility], [facility]  Social Connections: Unknown (12/18/2022)   Received from [facility], [facility]  Stress: No Stress Concern Present (01/21/2022)   Received from [facility], [facility]  Recent Concern: Stress - Stress Concern Present (11/19/2021)   Received from [facility]  Tobacco Use: Medium Risk (07/21/2023)    Readmission Risk Interventions    07/24/2023   11:56 AM 05/08/2023    4:37 PM 01/25/2023    4:23 PM  Readmission Risk Prevention Plan  Transportation Screening Complete Complete Complete  PCP or Specialist Appt  within 3-5 Days   Complete  HRI or Home Care Consult   Complete  Social Work Consult for Recovery Care Planning/Counseling   Complete  Palliative Care Screening   Not Applicable  Medication Review [employer]) Complete Complete Referral to Pharmacy  PCP or Specialist appointment within 3-5 days of discharge Complete Complete   HRI or Home Care Consult Complete Complete   SW Recovery Care/Counseling Consult Complete Complete   Palliative Care Screening Not Applicable Complete   Skilled Nursing Facility Complete Not Applicable

## 2023-07-25 NOTE — Discharge Summary (Addendum)
Physician Discharge Summary  Lawrence [name] HYQ:[id number] DOB: 10/02/1955 DOA: 07/21/2023  PCP: [name], PA-C  Admit date: 07/21/2023 Discharge date: 07/25/2023  Admitted From: (Home) Disposition:  (SNF)  Recommendations for Outpatient Follow-up:  Check CBC, CMP in 3 days Diuresis closely and adjust as needed as his torsemide dose has been decreased by half, but it seems appropriate at this point but keep watching closely for volume overload Continue with CPAP at bedtime, patient has his own machine Is on multiple psychotropic medications, has been gradually decreased, try to taper if possible Her B12 level closely on p.o. supplements if remains elevated then will need to change to IM    Code status: DNR, this has been confirmed by patient,   Diet recommendation: Heart Healthy / Carb Modified   Brief/Interim Summary:  Lawrence [name] is a 67 y.o. male with medical history significant for hypertension, permanent atrial fibrillation on Eliquis, coronary artery disease, HFpEF, valvular disease status post TAVR, recent left cerebellar stroke, type 2 diabetes, seizure disorder, COPD, status post spinal stimulator, hospitalization due to acute CVA, for which she was discharged to CIR, and he was discharged home after that, he had recent admission on 07/14/2023 for recurrent falls, and discharged on 07/17/2023, he presents to the ED with multiple falls at home requiring EMS to come to his house to get him off the floor after his falls.  He finally agreed to come to the ED for further evaluation.   In the ED, no head injuries seen on imaging.  UA negative for pyuria.  Creatinine elevated above baseline and hypokalemia.  EDP requested admission for management of AKI, PT/OT, and possible rehab placement.  Admitted by [facility], hospitalist service.   Temperature 97.9.  BP 115/66, pulse 93, respiratory rate 18, O2 saturation 94% on room air.  Lab studies notable for WBC 12.8, hemoglobin 14.9,  platelet count 226.  Serum potassium 3.2, glucose 103, BUN 26, creatinine 1.60 from baseline creatinine 0.91, GFR 53.  Troponin 5, CPK 63.   Multiple falls  Ambulatory dysfunction  -Appears multifactorial, in the setting of recent acute CVA's, as well he is on multiple antihypertensive medications significant days diuresis with concern of orthostasis, as well he is on multiple psychotropic agents, all these factors can be contributing to his recurrent falls.. -He was seen by PT-OT, recommendation for SNF placement -Try to minimize psychotropic agents  -Has B12 deficiency, continue with supplements -Patient has been adjusted, diuresis dose has been adjusted as well - No evidence of infection for -CT head with no acute findings had repeated CT head at time of discharge as MRI brain could not be obtained over last 48 hours given he is stimulator which has not been charged, CT head at time of discharge with no acute findings, he is currently at baseline   AKI suspect prerenal in setting of dehydration from poor oral intake -Resolved with IV fluids and holding his home diuresis, now blood pressure and renal function has normalized, and started to develop some lower extremity edema, he was resumed back on torsemide, but I have cut his dose, he was on significantly elevated dose of 120 mg of oral torsemide twice a day, this has been decreased to 60 mg p.o. twice daily.    Hypokalemia Hypomagnesemia -Repleted, need to monitor closely and replace as needed  Recnet acute CVA -Continue with Eliquis, lipid-lowering agents, PT OT consulted, plan for SNF placement -CT head x 2 with no acute findings    Persistent atrial  fibrillation on Eliquis Continue with Eliquis Resume beta-blockers for heart rate control   Seizure disorder Resume home AEDs. Seizure precautions.    Hyperlipidemia Resume home regimen   Chronic HFpEF Euvolemic on exam, orthostasis and volume depleted on presentation, please  see above discussion regarding resuming his torsemide dose at 50%.   Iron-deficiency  -He does not have anemia, but previous workup significant for iron deficiency, will continue with p.o. supplement especially in the setting of restless leg syndrome  Type 2 diabetes with hyperlipidemia -Hold metformin at time of discharge given volume depletion, dehydration on presentation, and to minimize medication Last hemoglobin A1c 6.5 on 06/26/2023 Resume homeTresiba at a lower dose, and continue with insulin sliding scale Continue with Mounjaro in the setting of obesity    Diabetic polyneuropathy Polypharmacy Restless leg syndrome -With significant right lower extremity tingling, numbness, twitching, resembles listless leg syndrome, he is on multiple medications for that, he is on significant dose Lyrica 150 3 times daily, which is significantly high dose,'s so I will decrease to 75 mg p.o. twice daily specially with his multiple falls, I have tried him on Requip during hospital stay without much improvement so I will hold in starting a new medicine at time of discharge. -Her B12 closely given B12 deficiency will contribute to tingling and numbness   GERD Resume home PPI   Obesity class III BMI 43 Recommend weight loss outpatient with regular physical activity and healthy diet. He is already on Mounjaro    OSA -Continue with CPAP, continue with his own CPAP   B12 deficiency -B12 level is low despite p.o. supplements, sure if he has been compliant as an outpatient, received IM supplement during hospital stay, continue with p.o. as an outpatient and recheck level in 4 to 6 weeks, and if remains low then he will need to be on IM supplements.    Discharge Diagnoses:  Principal Problem:   Multiple falls Active Problems:   Acute on chronic diastolic CHF (congestive heart failure) (HCC)   Acute metabolic encephalopathy   Acute kidney injury superimposed on stage 3a chronic kidney disease  (HCC)   AF (paroxysmal atrial fibrillation) (HCC)   Ambulatory dysfunction   Atrial fibrillation, chronic (HCC)   Acute CVA (cerebrovascular accident) (HCC)   Type 2 diabetes mellitus with diabetic polyneuropathy, with long-term current use of insulin [facility])    Discharge Instructions  Discharge Instructions     Diet - low sodium heart healthy   Complete by: As directed    Discharge instructions   Complete by: As directed    Increase activity slowly   Complete by: As directed    No wound care   Complete by: As directed       Allergies as of 07/25/2023       Reactions   Vancomycin Other (See Comments)   "Red Man Syndrome" 02/02/17: possible cause for rash under both arms   Niacin And Related Other (See Comments)   Red man syndrome   Tubersol [tuberculin, Ppd] Other (See Comments)   Reaction unknown   Doxycycline Rash, Other (See Comments)        Medication List     STOP taking these medications    metFORMIN 500 MG 24 hr tablet Commonly known as: GLUCOPHAGE-XR   NovoLOG FlexPen 100 UNIT/ML FlexPen Generic drug: insulin aspart Replaced by: insulin aspart 100 UNIT/ML injection   prazosin 2 MG capsule Commonly known as: MINIPRESS   traZODone 100 MG tablet Commonly known as: DESYREL  TAKE these medications    acetaminophen 650 MG CR tablet Commonly known as: TYLENOL Take 1 tablet (650 mg total) by mouth every 8 (eight) hours as needed for pain.   albuterol 108 (90 Base) MCG/ACT inhaler Commonly known as: VENTOLIN HFA Inhale 2 puffs into the lungs every 6 (six) hours as needed for wheezing or shortness of breath.   allopurinol 100 MG tablet Commonly known as: Zyloprim Take 1 tablet (100 mg total) by mouth daily. What changed: See the new instructions.   apixaban 5 MG Tabs tablet Commonly known as: Eliquis Take 1 tablet (5 mg total) by mouth 2 (two) times daily.   ARIPiprazole 5 MG tablet Commonly known as: ABILIFY Take 1 tablet (5 mg total)  by mouth at bedtime. What changed: when to take this   atorvastatin 40 MG tablet Commonly known as: LIPITOR Take 40 mg by mouth at bedtime.   bisacodyl 5 MG EC tablet Commonly known as: DULCOLAX Take 2 tablets (10 mg total) by mouth daily as needed for moderate constipation.   busPIRone 10 MG tablet Commonly known as: BUSPAR Take 1 tablet (10 mg total) by mouth 3 (three) times daily.   colchicine 0.6 MG tablet Take 1 tablet (0.6 mg total) by mouth daily.   cyanocobalamin 1000 MCG tablet Commonly known as: VITAMIN B12 Take 1,000 mcg by mouth daily.   fenofibrate 145 MG tablet Commonly known as: Tricor Take 1 tablet (145 mg total) by mouth daily.   ferrous sulfate 324 (65 Fe) MG Tbec Take 324 mg by mouth daily with breakfast.   fluticasone 50 MCG/ACT nasal spray Commonly known as: FLONASE Place 2 sprays into both nostrils daily.   hydrocerin Crea Apply 1 Application topically 2 (two) times daily.   icosapent Ethyl 1 g capsule Commonly known as: Vascepa Take 2 capsules (2 g total) by mouth 2 (two) times daily.   insulin aspart 100 UNIT/ML injection Commonly known as: novoLOG Inject 0-15 Units into the skin 3 (three) times daily with meals. 0-15 Units, Subcutaneous, 3 times daily with meals, First dose on Sun 07/23/23 at 1700 Correction coverage: Moderate (average weight, post-op) CBG < 70: Implement Hypoglycemia Standing Orders and refer to Hypoglycemia Standing Orders sidebar report CBG 70 - 120: 0 units CBG 121 - 150: 2 units CBG 151 - 200: 3 units CBG 201 - 250: 5 units CBG 251 - 300: 8 units CBG 301 - 350: 11 units CBG 351 - 400: 15 units CBG > 400: call MD and obtain STAT lab verification Replaces: NovoLOG FlexPen 100 UNIT/ML FlexPen   insulin aspart 100 UNIT/ML injection Commonly known as: novoLOG Inject 0-5 Units into the skin at bedtime. 0-5 Units, Subcutaneous, Daily at bedtime, First dose on Sun 07/23/23 at 2200 Correction coverage: HS scale CBG < 70: Implement  Hypoglycemia Standing Orders and refer to Hypoglycemia Standing Orders sidebar report CBG 70 - 120: 0 units CBG 121 - 150: 0 units CBG 151 - 200: 0 units CBG 201 - 250: 2 units CBG 251 - 300: 3 units CBG 301 - 350: 4 units CBG 351 - 400: 5 units   Lacosamide 150 MG Tabs Take 1 tablet (150 mg total) by mouth 2 (two) times daily.   levETIRAcetam 500 MG tablet Commonly known as: KEPPRA Take 1 tablet (500 mg total) by mouth 2 (two) times daily.   methocarbamol 500 MG tablet Commonly known as: ROBAXIN Take 1 tablet (500 mg total) by mouth every 8 (eight) hours as needed for muscle spasms.  metoprolol succinate 50 MG 24 hr tablet Commonly known as: TOPROL-XL Take 50 mg by mouth daily.   mirtazapine 15 MG tablet Commonly known as: REMERON Take 15 mg by mouth at bedtime.   Mounjaro 15 MG/0.5ML Pen Generic drug: tirzepatide INJECT 15 MG INTO THE SKIN ONCE A WEEK. What changed: See the new instructions.   Muscle Rub 10-15 % Crea Apply 1 Application topically as needed for muscle pain (to knees for pain).   omeprazole 20 MG capsule Commonly known as: PRILOSEC Take 20 mg by mouth every morning.   polyethylene glycol powder 17 GM/SCOOP powder Commonly known as: GLYCOLAX/MIRALAX Mix in water as directed and take 1 capful (17 g) by mouth daily.   potassium chloride SA 20 MEQ tablet Commonly known as: Klor-Con M20 Take 2 tablets (40 mEq total) by mouth daily. What changed: See the new instructions.   pregabalin 75 MG capsule Commonly known as: Lyrica Take 1 capsule (75 mg total) by mouth 2 (two) times daily. What changed:  medication strength how much to take when to take this   rOPINIRole 0.25 MG tablet Commonly known as: REQUIP Take 1 tablet (0.25 mg total) by mouth 2 (two) times daily.   rosuvastatin 40 MG tablet Commonly known as: CRESTOR Take 1 tablet (40 mg total) by mouth daily. What changed: when to take this   senna-docusate 8.6-50 MG tablet Commonly known as:  Senokot-S Take 2 tablets by mouth 2 (two) times daily. What changed:  how much to take when to take this   torsemide 20 MG tablet Commonly known as: DEMADEX Take 3 tablets (60 mg total) by mouth 2 (two) times daily. What changed: how much to take   Trelegy Ellipta 200-62.5-25 MCG/ACT Aepb Generic drug: Fluticasone-Umeclidin-Vilant INHALE 1 PUFF BY MOUTH INTO LUNGS ONCE DAILY   Tresiba FlexTouch 100 UNIT/ML FlexTouch Pen Generic drug: insulin degludec Inject 25 Units into the skin daily. What changed: how much to take   venlafaxine XR 75 MG 24 hr capsule Commonly known as: EFFEXOR-XR Take 2 capsules (150 mg total) by mouth daily with breakfast. What changed: how much to take        Contact information for after-discharge care     Destination     [facility] Preferred SNF .   Service: Skilled Nursing Contact information: [address] [place] [address] [phone]                    Allergies  Allergen Reactions   Vancomycin Other (See Comments)    "Red Man Syndrome" 02/02/17: possible cause for rash under both arms   Niacin And Related Other (See Comments)    Red man syndrome   Tubersol [Tuberculin, Ppd] Other (See Comments)    Reaction unknown   Doxycycline Rash and Other (See Comments)    Consultations: none   Procedures/Studies: CT HEAD WO CONTRAST ( ) Result Date: 07/25/2023 CLINICAL DATA:  Multiple falls EXAM: CT HEAD WITHOUT CONTRAST TECHNIQUE: Contiguous axial images were obtained from the base of the skull through the vertex without intravenous contrast. RADIATION DOSE REDUCTION: This exam was performed according to the departmental dose-optimization program which includes automated exposure control, adjustment of the mA and/or kV according to patient size and/or use of iterative reconstruction technique. COMPARISON:  07/21/2023 FINDINGS: Brain: No evidence of acute infarction, hemorrhage, mass, mass effect, or  midline shift. No hydrocephalus or extra-axial fluid collection. Vascular: No hyperdense vessel. Skull: Negative for fracture or focal lesion. Sinuses/Orbits: Mucosal thickening in  the ethmoid air cells and inferior left maxillary sinus. No acute finding in the orbits. Other: The mastoid air cells are well aerated. IMPRESSION: No acute intracranial process. Electronically Signed   By: Wiliam Ke M.D.   On: 07/25/2023 13:53   DG Chest Port 1 View Result Date: 07/21/2023 CLINICAL DATA:  Weakness. EXAM: PORTABLE CHEST 1 VIEW COMPARISON:  Chest radiograph 07/14/2023. FINDINGS: Low lung volumes accentuate the pulmonary vasculature and cardiomediastinal silhouette. No consolidation or pulmonary edema. No pleural effusion or pneumothorax. IMPRESSION: Low lung volumes without evidence of acute cardiopulmonary disease. Electronically Signed   By: Orvan Falconer M.D.   On: 07/21/2023 21:40   CT Head Wo Contrast Result Date: 07/21/2023 CLINICAL DATA:  Transient ischemic attack (TIA).  Fall. EXAM: CT HEAD WITHOUT CONTRAST TECHNIQUE: Contiguous axial images were obtained from the base of the skull through the vertex without intravenous contrast. RADIATION DOSE REDUCTION: This exam was performed according to the departmental dose-optimization program which includes automated exposure control, adjustment of the mA and/or kV according to patient size and/or use of iterative reconstruction technique. COMPARISON:  MRI brain 07/14/2023. FINDINGS: Brain: No acute hemorrhage. Unchanged mild chronic small-vessel disease. Cortical gray-white differentiation is otherwise preserved. Prominence of the ventricles and sulci within expected range for age. No hydrocephalus or extra-axial collection. No mass effect or midline shift. Vascular: No hyperdense vessel or unexpected calcification. Skull: No calvarial fracture or suspicious bone lesion. Skull base is unremarkable. Sinuses/Orbits: Mild mucosal disease in the inferior left  maxillary sinus and partial opacification of the ethmoid air cells. Orbits are unremarkable. Other: None. IMPRESSION: 1. No acute intracranial abnormality. 2. Unchanged mild chronic small-vessel disease. Electronically Signed   By: Orvan Falconer M.D.   On: 07/21/2023 21:39   MR BRAIN WO CONTRAST Result Date: 07/14/2023 CLINICAL DATA:  Acute neurologic deficit.  Left-sided weakness. EXAM: MRI HEAD WITHOUT CONTRAST TECHNIQUE: Multiplanar, multiecho pulse sequences of the brain and surrounding structures were obtained without intravenous contrast. COMPARISON:  06/25/2023 FINDINGS: Brain: No acute infarct, mass effect or extra-axial collection. No acute or chronic hemorrhage. Minimal multifocal hyperintense T2-weight signal within the white matter. The midline structures are normal. Vascular: Normal flow voids. Skull and upper cervical spine: Normal calvarium and skull base. Visualized upper cervical spine and soft tissues are normal. Sinuses/Orbits:No paranasal sinus fluid levels or advanced mucosal thickening. No mastoid or middle ear effusion. Normal orbits. IMPRESSION: No acute intracranial abnormality. Minimal chronic small vessel disease. Electronically Signed   By: Deatra Robinson M.D.   On: 07/14/2023 22:38   EEG adult Result Date: 07/14/2023 Lawrence Quest, MD     07/14/2023  4:55 PM Patient Name: Lawrence [name] MRN: [id number] Epilepsy Attending: Charlsie [name] Referring Physician/Provider: Erick Blinks, MD Date: 07/14/2023 Duration: 26.22 mins Patient history: 67yo M with ams getting eeg to evaluate for seizure Level of alertness: Awake, asleep AEDs during EEG study: LEV Technical aspects: This EEG study was done with scalp electrodes positioned according to the 10-20 International system of electrode placement. Electrical activity was reviewed with band pass filter of 1-70Hz , sensitivity of 7 uV/mm, display speed of 14mm/sec with a 60Hz  notched filter applied as appropriate. EEG data were  recorded continuously and digitally stored.  Video monitoring was available and reviewed as appropriate. Description: The posterior dominant rhythm consists of 7.5 Hz activity of moderate voltage (25-35 uV) seen predominantly in posterior head regions, symmetric and reactive to eye opening and eye closing. Sleep was characterized by vertex waves, sleep spindles (12 to  14 Hz), maximal frontocentral region. Physiologic photic driving was not seen during photic stimulation. Hyperventilation was not performed.   IMPRESSION: This study is within normal limits. No seizures or definite epileptiform discharges were seen throughout the recording. A normal interictal EEG does not exclude the diagnosis of epilepsy. Lawrence [name]   DG Chest Portable 1 View Result Date: 07/14/2023 CLINICAL DATA:  67 year old male with shortness of breath. Code stroke, left side weakness. EXAM: PORTABLE CHEST 1 VIEW COMPARISON:  Portable chest 07/02/2023. CTA head and neck 0331 hours today. FINDINGS: Portable AP semi upright view at 0418 hours. Low lung volumes. Stable cardiac and mediastinal contours, mediastinal lipomatosis partially visible on CTA today. Visualized tracheal air column is within normal limits. Crowding of lung markings. No pneumothorax, pulmonary edema, pleural effusion or consolidation. IMPRESSION: Low lung volumes.  No other acute cardiopulmonary abnormality. Electronically Signed   By: Odessa Fleming M.D.   On: 07/14/2023 04:50   CT ANGIO HEAD NECK W WO CM (CODE STROKE) Result Date: 07/14/2023 CLINICAL DATA:  Left-sided weakness EXAM: CT ANGIOGRAPHY HEAD AND NECK WITH AND WITHOUT CONTRAST TECHNIQUE: Multidetector CT imaging of the head and neck was performed using the standard protocol during bolus administration of intravenous contrast. Multiplanar CT image reconstructions and MIPs were obtained to evaluate the vascular anatomy. Carotid stenosis measurements (when applicable) are obtained utilizing NASCET criteria, using  the distal internal carotid diameter as the denominator. RADIATION DOSE REDUCTION: This exam was performed according to the departmental dose-optimization program which includes automated exposure control, adjustment of the mA and/or kV according to patient size and/or use of iterative reconstruction technique. CONTRAST:  75mL OMNIPAQUE IOHEXOL 350 MG/ML SOLN COMPARISON:  None Available. FINDINGS: CTA NECK FINDINGS SKELETON: No acute abnormality or high grade bony spinal canal stenosis. OTHER NECK: Debris throughout the cervical esophagus UPPER CHEST: No pneumothorax or pleural effusion. No nodules or masses. AORTIC ARCH: There is calcific atherosclerosis of the aortic arch. Normal variant aortic arch branching pattern with the brachiocephalic and left common carotid arteries sharing a common origin. RIGHT CAROTID SYSTEM: No dissection, occlusion or aneurysm. Mild atherosclerotic calcification at the carotid bifurcation without hemodynamically significant stenosis. LEFT CAROTID SYSTEM: No dissection, occlusion or aneurysm. There is mixed density atherosclerosis extending into the proximal ICA, resulting in less than 50% stenosis. VERTEBRAL ARTERIES: Left dominant configuration. There is no dissection, occlusion or flow-limiting stenosis to the skull base (V1-V3 segments). CTA HEAD FINDINGS POSTERIOR CIRCULATION: Vertebral arteries are normal. No proximal occlusion of the anterior or inferior cerebellar arteries. Basilar artery is normal. Superior cerebellar arteries are normal. Posterior cerebral arteries are normal. ANTERIOR CIRCULATION: Intracranial internal carotid arteries are normal. Anterior cerebral arteries are normal. Middle cerebral arteries are normal. VENOUS SINUSES: As permitted by contrast timing, patent. ANATOMIC VARIANTS: None Review of the MIP images confirms the above findings. IMPRESSION: 1. No emergent large vessel occlusion or hemodynamically significant stenosis of the head or neck. 2. Debris  throughout the cervical esophagus, suggestive of gastroesophageal reflux. Aortic Atherosclerosis (ICD10-I70.0). Electronically Signed   By: Deatra Robinson M.D.   On: 07/14/2023 03:45   CT HEAD CODE STROKE WO CONTRAST Result Date: 07/14/2023 CLINICAL DATA:  Code stroke.  Left-sided weakness EXAM: CT HEAD WITHOUT CONTRAST TECHNIQUE: Contiguous axial images were obtained from the base of the skull through the vertex without intravenous contrast. RADIATION DOSE REDUCTION: This exam was performed according to the departmental dose-optimization program which includes automated exposure control, adjustment of the mA and/or kV according to patient size and/or use  of iterative reconstruction technique. COMPARISON:  None Available. FINDINGS: Brain: There is no mass, hemorrhage or extra-axial collection. The size and configuration of the ventricles and extra-axial CSF spaces are normal. The brain parenchyma is normal, without evidence of acute or chronic infarction. Vascular: No abnormal hyperdensity of the major intracranial arteries or dural venous sinuses. No intracranial atherosclerosis. Skull: The visualized skull base, calvarium and extracranial soft tissues are normal. Sinuses/Orbits: No fluid levels or advanced mucosal thickening of the visualized paranasal sinuses. No mastoid or middle ear effusion. The orbits are normal. ASPECTS [facility] Stroke Program Early CT Score) - Ganglionic level infarction (caudate, lentiform nuclei, internal capsule, insula, M1-M3 cortex): 7 - Supraganglionic infarction (M4-M6 cortex): 3 Total score (0-10 with 10 being normal): 10 IMPRESSION: 1. No acute intracranial abnormality. 2. ASPECTS is 10. These results were communicated to Dr. Erick Blinks at 3:35 am on 07/14/2023 by text page via the [facility] messaging system. Electronically Signed   By: Deatra Robinson M.D.   On: 07/14/2023 03:37   LONG TERM MONITOR-LIVE TELEMETRY (3-14 DAYS) Result Date: 07/12/2023   Predominant rhythm was  normal sinus rhythm with average heart rate 81bpm (ranged from 64 to 96bpm)   Rave PACs and atrial triplets and 1 run of SVT for 4 beats at 169bpm   Frequent PVCs with PVC load 32.2%   Rare ventricular couplets, triplets and bigeminal and trigeminal PVCs. Patch Wear Time:  3 days and 0 hours (2024-11-22T11:10:22-0500 to 2024-11-25T11:15:29-0500) Patient had a min HR of 64 bpm, max HR of 169 bpm, and avg HR of 81 bpm. Predominant underlying rhythm was Sinus Rhythm. 1 run of Supraventricular Tachycardia occurred lasting 4 beats with a max rate of 169 bpm (avg 165 bpm). Isolated SVEs were rare (<1.0%), SVE Triplets were rare (<1.0%), and no SVE Couplets were present. Isolated VEs were frequent (32.2%, 83281), VE Couplets were rare (<1.0%, 983), and VE Triplets were rare (<1.0%, 110). Ventricular Bigeminy and Trigeminy were present.   DG CHEST PORT 1 VIEW Result Date: 07/02/2023 CLINICAL DATA:  141880 SOB (shortness of breath) [id number] EXAM: PORTABLE CHEST 1 VIEW COMPARISON:  June 29, 2023 FINDINGS: The cardiomediastinal silhouette is unchanged and enlarged in contour. No pleural effusion. No pneumothorax. No acute pleuroparenchymal abnormality. IMPRESSION: No acute cardiopulmonary abnormality. Electronically Signed   By: Meda Klinefelter M.D.   On: 07/02/2023 16:23   DG Chest 2 View Result Date: 06/29/2023 CLINICAL DATA:  Hypoxia. EXAM: CHEST - 2 VIEW COMPARISON:  Chest radiograph dated 06/24/2023. FINDINGS: Cardiomegaly with vascular congestion. No focal consolidation, pleural effusion, pneumothorax. Aortic valve repair. Degenerative changes of the spine. No acute osseous pathology. IMPRESSION: Cardiomegaly with vascular congestion.  No focal consolidation. Electronically Signed   By: Elgie Collard M.D.   On: 06/29/2023 18:57   ECHOCARDIOGRAM COMPLETE BUBBLE STUDY Addendum Date: 06/26/2023 Status post 34 mm TAVR Medtronic evolute FX valve (02/07/2023), at the aortic position, grossly well-seated, no  perivalvular leak, no valvular regurgitation, either mild valvular stenosis versus prosthesis patient mismatch (peak velocity 2.7 m/s, peak gradient 29 mmHg, mean gradient 16 mmHg, dimensional index 0.44, EOA 1.11 cm, BSA 2.8 m2, acceleration time 82 ms. Clinical correlation required. [employer] Electronically Amended 06/26/2023, 5:34 PM   Final [facility])    Result Date: 06/26/2023    ECHOCARDIOGRAM REPORT   Patient Name:   Lawrence [name] Date of Exam: 06/26/2023 Medical Rec #:  [id number]        Height:       74.0 in Accession #:  [id number]       Weight:       373.0 lb Date of Birth:  [birth year]        BSA:          2.833 m Patient Age:    67 years         BP:           112/74 mmHg Patient Gender: M                HR:           79 bpm. Exam Location:  Inpatient Procedure: 2D Echo, Cardiac Doppler, Color Doppler and Saline Contrast Bubble            Study Indications:    Stroke I63.9  History:        Patient has prior history of Echocardiogram examinations, most                 recent 03/10/2023. CHF, CAD, Stroke and COPD, Arrythmias:Atrial                 Fibrillation; Risk Factors:Hypertension, Dyslipidemia and Former                 Smoker.  Sonographer:    Dondra Prader RVT RCS Referring Phys: [phone] DEBBY CROSLEY  Sonographer Comments: Technically difficult study due to poor echo windows, suboptimal apical window and suboptimal subcostal window. Image acquisition challenging due to COPD and Image acquisition challenging due to patient body habitus. IMPRESSIONS  1. Technically difficult study.  2. Left ventricular ejection fraction, by estimation, is 50 to 55%. The left ventricle has low normal function. Left ventricular endocardial border not optimally defined to evaluate regional wall motion. The left ventricular internal cavity size was dilated. Left ventricular diastolic function could not be evaluated.  3. Right ventricular systolic function is normal. The right ventricular size is normal. Mildly  increased right ventricular wall thickness.  4. There is no evidence of cardiac tamponade.  5. The mitral valve is grossly normal. No evidence of mitral valve regurgitation. No evidence of mitral stenosis.  6. The aortic valve was not well visualized. Aortic valve regurgitation is not visualized. But based on doppler parameter cannot rule out mild (per max velocity and MG) to moderate aortic stenosis (dimensional index).  7. Aortic ascending aorta is structurally normal, with no evidence of dilitation.  8. Agitated saline contrast bubble study was inconclusive. Conclusion(s)/Recommendation(s): No intracardiac source of embolism detected on this transthoracic study. Consider a transesophageal echocardiogram to exclude cardiac source of embolism if clinically indicated. FINDINGS  Left Ventricle: Left ventricular ejection fraction, by estimation, is 50 to 55%. The left ventricle has low normal function. Left ventricular endocardial border not optimally defined to evaluate regional wall motion. The left ventricular internal cavity  size was dilated. There is borderline left ventricular hypertrophy. Left ventricular diastolic function could not be evaluated. Right Ventricle: The right ventricular size is normal. Mildly increased right ventricular wall thickness. Right ventricular systolic function is normal. Left Atrium: Left atrial size was normal in size. Right Atrium: Right atrial size was normal in size. Pericardium: Trivial pericardial effusion is present. There is no evidence of cardiac tamponade. Mitral Valve: The mitral valve is grossly normal. No evidence of mitral valve regurgitation. No evidence of mitral valve stenosis. Tricuspid Valve: The tricuspid valve is grossly normal. Tricuspid valve regurgitation is not demonstrated. No evidence of tricuspid stenosis. Aortic Valve: The aortic valve was not well visualized. Aortic valve regurgitation  is not visualized. But based on doppler parameter cannot rule out  mild (per max velocity and MG) to moderate aortic stenosis (dimensional index). Aortic valve mean gradient measures 15.7 mmHg. Aortic valve peak gradient measures 29.4 mmHg. Aortic valve area, by VTI measures 1.11 cm. Pulmonic Valve: The pulmonic valve was not well visualized. Pulmonic valve regurgitation is not visualized. No evidence of pulmonic stenosis. Aorta: Aortic root could not be assessed and ascending aorta is structurally normal, with no evidence of dilitation. IAS/Shunts: The interatrial septum was not assessed. Agitated saline contrast was given intravenously to evaluate for intracardiac shunting. Agitated saline contrast bubble study was inconclusive.  LEFT VENTRICLE PLAX 2D LVIDd:         6.30 cm LVIDs:         4.60 cm LV PW:         1.30 cm LV IVS:        1.10 cm LVOT diam:     1.80 cm LV SV:         58 LV SV Index:   21 LVOT Area:     2.54 cm  RIGHT VENTRICLE             IVC RV S prime:     16.20 cm/s  IVC diam: 3.30 cm LEFT ATRIUM             Index        RIGHT ATRIUM           Index LA diam:        4.80 cm 1.69 cm/m   RA Area:     17.10 cm LA Vol (A2C):   62.1 ml 21.92 ml/m  RA Volume:   43.60 ml  15.39 ml/m LA Vol (A4C):   77.2 ml 27.25 ml/m LA Biplane Vol: 70.8 ml 24.99 ml/m  AORTIC VALVE                     PULMONIC VALVE AV Area (Vmax):    1.01 cm      PV Vmax:       1.23 m/s AV Area (Vmean):   1.09 cm      PV Peak grad:  6.1 mmHg AV Area (VTI):     1.11 cm AV Vmax:           271.33 cm/s AV Vmean:          181.000 cm/s AV VTI:            0.524 m AV Peak Grad:      29.4 mmHg AV Mean Grad:      15.7 mmHg LVOT Vmax:         107.42 cm/s LVOT Vmean:        77.260 cm/s LVOT VTI:          0.229 m LVOT/AV VTI ratio: 0.44  AORTA Ao Asc diam: 3.80 cm MITRAL VALVE MV Area (PHT): 4.39 cm    SHUNTS MV Decel Time: 173 msec    Systemic VTI:  0.23 m MV E velocity: 85.60 cm/s  Systemic Diam: 1.80 cm MV A velocity: 78.00 cm/s MV E/A ratio:  1.10 [employer] Electronically signed by Tessa Lerner  Signature Date/Time: 06/26/2023/11:37:34 AM    Final (Amended)       Subjective:  Significant events overnight, he denies any complaints today, he is eager for discharge today   discharge Exam: Vitals:   07/25/23 0802 07/25/23 1215  BP: 113/63 120/65  Pulse: [name])  55 92  Resp: 18 12  Temp: 97.9 F (36.6 C) (!) 97.3 F (36.3 C)  SpO2:     Vitals:   07/24/23 2321 07/25/23 0409 07/25/23 0802 07/25/23 1215  BP: 105/79 (!) 110/58 113/63 120/65  Pulse: 93 86 (!) 55 92  Resp: 19 18 18 12   Temp: 97.9 F (36.6 C) 97.8 F (36.6 C) 97.9 F (36.6 C) (!) 97.3 F (36.3 C)  TempSrc: Oral Oral Oral Oral  SpO2: 92%     Weight:      Height:        General: Pt is alert, awake, not in acute distress Cardiovascular: RRR, S1/S2 +, no rubs, no gallops Respiratory: CTA bilaterally, no wheezing, no rhonchi Abdominal: Soft, NT, ND, bowel sounds + Extremities: Lower extremity edema has resolved today, he has chronic lower extremity skin changes    The results of significant diagnostics from this hospitalization (including imaging, microbiology, ancillary and laboratory) are listed below for reference.     Microbiology: No results found for this or any previous visit (from the past 240 hours).   Labs: BNP (last 3 results) Recent Labs    06/09/23 1354 07/14/23 0314 07/15/23 1012  BNP 23.1 27.5 78.1   Basic Metabolic Panel: Recent Labs  Lab 07/21/23 2137 07/21/23 2205 07/22/23 0427 07/23/23 0527 07/24/23 0416  NA 142 144 138 137 137  K 3.4* 3.2* 3.2* 3.4* 3.6  CL 100 100 101 101 107  CO2 29  --  27 28 24   GLUCOSE 105* 103* 114* 170* 145*  BUN 24* 26* 24* 17 13  CREATININE 1.46* 1.60* 1.16 0.99 0.87  CALCIUM 9.2  --  8.5* 9.2 9.2  MG  --   --  1.5* 1.7  --   PHOS  --   --  3.2 3.1  --    Liver Function Tests: Recent Labs  Lab 07/21/23 2137  AST 22  ALT 33  ALKPHOS 71  BILITOT 0.7  PROT 7.1  ALBUMIN 3.9   No results for input(s): "LIPASE", "AMYLASE" in the last  168 hours. No results for input(s): "AMMONIA" in the last 168 hours. CBC: Recent Labs  Lab 07/21/23 2137 07/21/23 2205 07/22/23 0427 07/23/23 0527 07/24/23 0416  WBC 12.8*  --  9.3 8.5 8.6  NEUTROABS 10.2*  --   --   --   --   HGB 14.9 15.3 13.4 12.7* 13.5  HCT 46.4 45.0 40.1 38.8* 41.0  MCV 90.3  --  88.1 88.8 88.2  PLT 226  --  202 191 207   Cardiac Enzymes: Recent Labs  Lab 07/21/23 2137  CKTOTAL 63   BNP: Invalid input(s): "POCBNP" CBG: Recent Labs  Lab 07/24/23 1148 07/24/23 1610 07/24/23 2124 07/25/23 0805 07/25/23 1217  GLUCAP 146* 167* 168* 164* 211*   D-Dimer No results for input(s): "DDIMER" in the last 72 hours. Hgb A1c No results for input(s): "HGBA1C" in the last 72 hours. Lipid Profile No results for input(s): "CHOL", "HDL", "LDLCALC", "TRIG", "CHOLHDL", "LDLDIRECT" in the last 72 hours. Thyroid function studies No results for input(s): "TSH", "T4TOTAL", "T3FREE", "THYROIDAB" in the last 72 hours.  Invalid input(s): "FREET3" Anemia work up Recent Labs    07/23/23 1413  FERRITIN 71  TIBC 398  IRON 53   Urinalysis    Component Value Date/Time   COLORURINE YELLOW 07/21/2023 2137   APPEARANCEUR CLEAR 07/21/2023 2137   LABSPEC 1.009 07/21/2023 2137   PHURINE 5.0 07/21/2023 2137   GLUCOSEU 50 (A) 07/21/2023 2137  HGBUR NEGATIVE 07/21/2023 2137   BILIRUBINUR NEGATIVE 07/21/2023 2137   KETONESUR NEGATIVE 07/21/2023 2137   PROTEINUR NEGATIVE 07/21/2023 2137   NITRITE NEGATIVE 07/21/2023 2137   LEUKOCYTESUR NEGATIVE 07/21/2023 2137   Sepsis Labs Recent Labs  Lab 07/21/23 2137 07/22/23 0427 07/23/23 0527 07/24/23 0416  WBC 12.8* 9.3 8.5 8.6   Microbiology No results found for this or any previous visit (from the past 240 hours).   Time coordinating discharge: Over 30 minutes  SIGNED:   Huey Bienenstock, MD  [facility] 07/25/2023, 3:23 PM Pager   If 7PM-7AM, please contact night-coverage [email or web address] Password  [email or web address]

## 2023-07-25 NOTE — Plan of Care (Signed)
  Problem: Education: Goal: Knowledge of General Education information will improve Description: Including pain rating scale, medication(s)/side effects and non-pharmacologic comfort measures Outcome: Progressing   Problem: Health Behavior/Discharge Planning: Goal: Ability to manage health-related needs will improve Outcome: Progressing   Problem: Clinical Measurements: Goal: Ability to maintain clinical measurements within normal limits will improve Outcome: Progressing Goal: Will remain free from infection Outcome: Progressing   Problem: Activity: Goal: Risk for activity intolerance will decrease Outcome: Progressing   Problem: Nutrition: Goal: Adequate nutrition will be maintained Outcome: Progressing   Problem: Coping: Goal: Level of anxiety will decrease Outcome: Progressing   Problem: Elimination: Goal: Will not experience complications related to bowel motility Outcome: Progressing   Problem: Pain Management: Goal: General experience of comfort will improve Outcome: Progressing   Problem: Safety: Goal: Ability to remain free from injury will improve Outcome: Progressing   Problem: Skin Integrity: Goal: Risk for impaired skin integrity will decrease Outcome: Progressing

## 2023-07-26 DIAGNOSIS — I5032 Chronic diastolic (congestive) heart failure: Secondary | ICD-10-CM | POA: Diagnosis not present

## 2023-07-26 DIAGNOSIS — F411 Generalized anxiety disorder: Secondary | ICD-10-CM | POA: Diagnosis not present

## 2023-07-26 DIAGNOSIS — I4821 Permanent atrial fibrillation: Secondary | ICD-10-CM | POA: Diagnosis not present

## 2023-07-26 DIAGNOSIS — R296 Repeated falls: Secondary | ICD-10-CM | POA: Diagnosis not present

## 2023-07-26 DIAGNOSIS — R197 Diarrhea, unspecified: Secondary | ICD-10-CM | POA: Diagnosis not present

## 2023-07-26 NOTE — Consult Note (Signed)
[facility] Liaison Consult Note    07/26/2023  BROM FLORIDA [birth year] [id number]  Insurance: [employer] Medicare  PCP: [name], Vernice Jefferson at [facility]  Patient was reviewed for less than 7 days readmission from home with multiple falls and transition to [facility] for [facility].  Plan: Notified [facility] transition team of readmission and disposition for readmission prevention needs for extreme high risk patient with 6 admissions in the past 6 months.  For questions, please contact:  [name], RN, BSN, CCM [place]  [facility], Population Health, [facility] Liaison Direct Dial: [phone] or secure chat Email: [email or web address]

## 2023-07-29 DIAGNOSIS — E11649 Type 2 diabetes mellitus with hypoglycemia without coma: Secondary | ICD-10-CM | POA: Diagnosis not present

## 2023-07-29 DIAGNOSIS — M6281 Muscle weakness (generalized): Secondary | ICD-10-CM | POA: Diagnosis not present

## 2023-07-29 DIAGNOSIS — R296 Repeated falls: Secondary | ICD-10-CM | POA: Diagnosis not present

## 2023-07-30 DIAGNOSIS — M109 Gout, unspecified: Secondary | ICD-10-CM | POA: Diagnosis not present

## 2023-07-31 DIAGNOSIS — I4821 Permanent atrial fibrillation: Secondary | ICD-10-CM | POA: Diagnosis not present

## 2023-07-31 DIAGNOSIS — I5032 Chronic diastolic (congestive) heart failure: Secondary | ICD-10-CM | POA: Diagnosis not present

## 2023-07-31 DIAGNOSIS — F331 Major depressive disorder, recurrent, moderate: Secondary | ICD-10-CM | POA: Diagnosis not present

## 2023-07-31 DIAGNOSIS — I639 Cerebral infarction, unspecified: Secondary | ICD-10-CM | POA: Diagnosis not present

## 2023-07-31 DIAGNOSIS — M6281 Muscle weakness (generalized): Secondary | ICD-10-CM | POA: Diagnosis not present

## 2023-07-31 DIAGNOSIS — E119 Type 2 diabetes mellitus without complications: Secondary | ICD-10-CM | POA: Diagnosis not present

## 2023-07-31 DIAGNOSIS — E569 Vitamin deficiency, unspecified: Secondary | ICD-10-CM | POA: Diagnosis not present

## 2023-07-31 DIAGNOSIS — G4730 Sleep apnea, unspecified: Secondary | ICD-10-CM | POA: Diagnosis not present

## 2023-07-31 DIAGNOSIS — R296 Repeated falls: Secondary | ICD-10-CM | POA: Diagnosis not present

## 2023-07-31 DIAGNOSIS — I10 Essential (primary) hypertension: Secondary | ICD-10-CM | POA: Diagnosis not present

## 2023-08-01 DIAGNOSIS — M109 Gout, unspecified: Secondary | ICD-10-CM | POA: Diagnosis not present

## 2023-08-01 DIAGNOSIS — M6281 Muscle weakness (generalized): Secondary | ICD-10-CM | POA: Diagnosis not present

## 2023-08-01 DIAGNOSIS — M79671 Pain in right foot: Secondary | ICD-10-CM | POA: Diagnosis not present

## 2023-08-02 DIAGNOSIS — Z9189 Other specified personal risk factors, not elsewhere classified: Secondary | ICD-10-CM | POA: Diagnosis not present

## 2023-08-02 DIAGNOSIS — R195 Other fecal abnormalities: Secondary | ICD-10-CM | POA: Diagnosis not present

## 2023-08-02 DIAGNOSIS — M79671 Pain in right foot: Secondary | ICD-10-CM | POA: Diagnosis not present

## 2023-08-02 DIAGNOSIS — M6281 Muscle weakness (generalized): Secondary | ICD-10-CM | POA: Diagnosis not present

## 2023-08-03 DIAGNOSIS — M6281 Muscle weakness (generalized): Secondary | ICD-10-CM | POA: Diagnosis not present

## 2023-08-03 DIAGNOSIS — M79671 Pain in right foot: Secondary | ICD-10-CM | POA: Diagnosis not present

## 2023-08-03 DIAGNOSIS — M109 Gout, unspecified: Secondary | ICD-10-CM | POA: Diagnosis not present

## 2023-08-04 ENCOUNTER — Encounter (HOSPITAL_COMMUNITY): Payer: Medicare HMO

## 2023-08-04 DIAGNOSIS — M6281 Muscle weakness (generalized): Secondary | ICD-10-CM | POA: Diagnosis not present

## 2023-08-04 DIAGNOSIS — I10 Essential (primary) hypertension: Secondary | ICD-10-CM | POA: Diagnosis not present

## 2023-08-04 DIAGNOSIS — R296 Repeated falls: Secondary | ICD-10-CM | POA: Diagnosis not present

## 2023-08-04 DIAGNOSIS — Z8673 Personal history of transient ischemic attack (TIA), and cerebral infarction without residual deficits: Secondary | ICD-10-CM | POA: Diagnosis not present

## 2023-08-09 DIAGNOSIS — M109 Gout, unspecified: Secondary | ICD-10-CM | POA: Diagnosis not present

## 2023-08-09 DIAGNOSIS — M79671 Pain in right foot: Secondary | ICD-10-CM | POA: Diagnosis not present

## 2023-08-09 DIAGNOSIS — R262 Difficulty in walking, not elsewhere classified: Secondary | ICD-10-CM | POA: Diagnosis not present

## 2023-08-09 DIAGNOSIS — M6281 Muscle weakness (generalized): Secondary | ICD-10-CM | POA: Diagnosis not present

## 2023-08-09 DIAGNOSIS — I69322 Dysarthria following cerebral infarction: Secondary | ICD-10-CM | POA: Diagnosis not present

## 2023-08-09 DIAGNOSIS — I69354 Hemiplegia and hemiparesis following cerebral infarction affecting left non-dominant side: Secondary | ICD-10-CM | POA: Diagnosis not present

## 2023-08-09 DIAGNOSIS — G4700 Insomnia, unspecified: Secondary | ICD-10-CM | POA: Diagnosis not present

## 2023-08-09 DIAGNOSIS — E611 Iron deficiency: Secondary | ICD-10-CM | POA: Diagnosis not present

## 2023-08-09 DIAGNOSIS — J449 Chronic obstructive pulmonary disease, unspecified: Secondary | ICD-10-CM | POA: Diagnosis not present

## 2023-08-09 DIAGNOSIS — J45909 Unspecified asthma, uncomplicated: Secondary | ICD-10-CM | POA: Diagnosis not present

## 2023-08-09 DIAGNOSIS — I69811 Memory deficit following other cerebrovascular disease: Secondary | ICD-10-CM | POA: Diagnosis not present

## 2023-08-09 DIAGNOSIS — L22 Diaper dermatitis: Secondary | ICD-10-CM | POA: Diagnosis not present

## 2023-08-09 DIAGNOSIS — Z79899 Other long term (current) drug therapy: Secondary | ICD-10-CM | POA: Diagnosis not present

## 2023-08-09 DIAGNOSIS — E785 Hyperlipidemia, unspecified: Secondary | ICD-10-CM | POA: Diagnosis not present

## 2023-08-09 DIAGNOSIS — W19XXXA Unspecified fall, initial encounter: Secondary | ICD-10-CM | POA: Diagnosis not present

## 2023-08-09 DIAGNOSIS — M150 Primary generalized (osteo)arthritis: Secondary | ICD-10-CM | POA: Diagnosis not present

## 2023-08-09 DIAGNOSIS — I2510 Atherosclerotic heart disease of native coronary artery without angina pectoris: Secondary | ICD-10-CM | POA: Diagnosis not present

## 2023-08-09 DIAGNOSIS — I5030 Unspecified diastolic (congestive) heart failure: Secondary | ICD-10-CM | POA: Diagnosis not present

## 2023-08-09 DIAGNOSIS — G894 Chronic pain syndrome: Secondary | ICD-10-CM | POA: Diagnosis not present

## 2023-08-09 DIAGNOSIS — E538 Deficiency of other specified B group vitamins: Secondary | ICD-10-CM | POA: Diagnosis not present

## 2023-08-09 DIAGNOSIS — E1169 Type 2 diabetes mellitus with other specified complication: Secondary | ICD-10-CM | POA: Diagnosis not present

## 2023-08-09 DIAGNOSIS — I4821 Permanent atrial fibrillation: Secondary | ICD-10-CM | POA: Diagnosis not present

## 2023-08-09 DIAGNOSIS — E1142 Type 2 diabetes mellitus with diabetic polyneuropathy: Secondary | ICD-10-CM | POA: Diagnosis not present

## 2023-08-09 DIAGNOSIS — I63542 Cerebral infarction due to unspecified occlusion or stenosis of left cerebellar artery: Secondary | ICD-10-CM | POA: Diagnosis not present

## 2023-08-09 DIAGNOSIS — N1831 Chronic kidney disease, stage 3a: Secondary | ICD-10-CM | POA: Diagnosis not present

## 2023-08-10 ENCOUNTER — Other Ambulatory Visit: Payer: Self-pay | Admitting: Adult Health

## 2023-08-10 DIAGNOSIS — F4310 Post-traumatic stress disorder, unspecified: Secondary | ICD-10-CM

## 2023-08-10 DIAGNOSIS — L22 Diaper dermatitis: Secondary | ICD-10-CM | POA: Diagnosis not present

## 2023-08-10 NOTE — Telephone Encounter (Signed)
 Pt is said he is in a rehab facility. While he is there we can not rf his medication, correct? And, he confirmed that he is taking Minipress 2 mg

## 2023-08-10 NOTE — Telephone Encounter (Signed)
 It depends on how long patient will be in rehab. The facility is giving medication so he doesn't need a RF until he is discharged.

## 2023-08-12 DIAGNOSIS — G4700 Insomnia, unspecified: Secondary | ICD-10-CM | POA: Diagnosis not present

## 2023-08-13 DIAGNOSIS — M6281 Muscle weakness (generalized): Secondary | ICD-10-CM | POA: Diagnosis not present

## 2023-08-13 DIAGNOSIS — G4700 Insomnia, unspecified: Secondary | ICD-10-CM | POA: Diagnosis not present

## 2023-08-14 DIAGNOSIS — G4700 Insomnia, unspecified: Secondary | ICD-10-CM | POA: Diagnosis not present

## 2023-08-14 DIAGNOSIS — M6281 Muscle weakness (generalized): Secondary | ICD-10-CM | POA: Diagnosis not present

## 2023-08-14 DIAGNOSIS — W19XXXA Unspecified fall, initial encounter: Secondary | ICD-10-CM | POA: Diagnosis not present

## 2023-08-15 DIAGNOSIS — I5030 Unspecified diastolic (congestive) heart failure: Secondary | ICD-10-CM | POA: Diagnosis not present

## 2023-08-15 DIAGNOSIS — W19XXXA Unspecified fall, initial encounter: Secondary | ICD-10-CM | POA: Diagnosis not present

## 2023-08-15 DIAGNOSIS — I63542 Cerebral infarction due to unspecified occlusion or stenosis of left cerebellar artery: Secondary | ICD-10-CM | POA: Diagnosis not present

## 2023-08-15 DIAGNOSIS — M6281 Muscle weakness (generalized): Secondary | ICD-10-CM | POA: Diagnosis not present

## 2023-08-15 DIAGNOSIS — R262 Difficulty in walking, not elsewhere classified: Secondary | ICD-10-CM | POA: Diagnosis not present

## 2023-08-15 DIAGNOSIS — M25571 Pain in right ankle and joints of right foot: Secondary | ICD-10-CM | POA: Diagnosis not present

## 2023-08-15 DIAGNOSIS — I69811 Memory deficit following other cerebrovascular disease: Secondary | ICD-10-CM | POA: Diagnosis not present

## 2023-08-15 DIAGNOSIS — Z79899 Other long term (current) drug therapy: Secondary | ICD-10-CM | POA: Diagnosis not present

## 2023-08-15 DIAGNOSIS — M79671 Pain in right foot: Secondary | ICD-10-CM | POA: Diagnosis not present

## 2023-08-15 DIAGNOSIS — G4700 Insomnia, unspecified: Secondary | ICD-10-CM | POA: Diagnosis not present

## 2023-08-16 DIAGNOSIS — G4700 Insomnia, unspecified: Secondary | ICD-10-CM | POA: Diagnosis not present

## 2023-08-16 DIAGNOSIS — M6281 Muscle weakness (generalized): Secondary | ICD-10-CM | POA: Diagnosis not present

## 2023-08-16 DIAGNOSIS — M79671 Pain in right foot: Secondary | ICD-10-CM | POA: Diagnosis not present

## 2023-08-17 DIAGNOSIS — M6281 Muscle weakness (generalized): Secondary | ICD-10-CM | POA: Diagnosis not present

## 2023-08-17 DIAGNOSIS — G894 Chronic pain syndrome: Secondary | ICD-10-CM | POA: Diagnosis not present

## 2023-08-23 ENCOUNTER — Encounter (HOSPITAL_COMMUNITY): Payer: Medicare HMO

## 2023-08-28 ENCOUNTER — Ambulatory Visit (INDEPENDENT_AMBULATORY_CARE_PROVIDER_SITE_OTHER): Payer: 59 | Admitting: Surgery

## 2023-08-28 ENCOUNTER — Encounter: Payer: Self-pay | Admitting: Surgery

## 2023-08-28 VITALS — BP 103/69 | HR 77 | Temp 98.1°F | Resp 20

## 2023-08-28 DIAGNOSIS — I83012 Varicose veins of right lower extremity with ulcer of calf: Secondary | ICD-10-CM | POA: Diagnosis not present

## 2023-08-28 DIAGNOSIS — L97219 Non-pressure chronic ulcer of right calf with unspecified severity: Secondary | ICD-10-CM

## 2023-08-28 NOTE — Progress Notes (Signed)
[facility] [place]  Patient name: Lawrence [name] MRN: [id number] DOB: 07-19-1956 Sex: male   REASON FOR VISIT:    Follow up  HISOTRY OF PRESENT ILLNESS:    Lawrence [name] is a 68 y.o. male who has been followed by Dr. Edilia Bo for CEAP class V disease.  He also has concomitant lymphedema.  He has been able to heal a ulcer on his left leg.  He underwent left saphenous vein laser ablation on 12/01/2022 he does have reflux in the anterior accessory vein.  Fortunately, he was able to heal the wound.  He continues to wear thigh-high compression.  He has a history of right saphenous vein ablation in 2022  Currently he does not have any active ulcers.  He continues to wear his compression socks.  Unfortunately, he has had a stroke since we last saw him.  He continues to be evaluated for coronary issues.   PAST MEDICAL HISTORY:   Past Medical History:  Diagnosis Date   Acquired dilation of ascending aorta and aortic root (HCC)    40mm by echo 01/2021   Adenomatous colon polyp 2007   Anemia    Anxiety    Asthma    BPH without obstruction/lower urinary tract symptoms 02/22/2017   Chronic diastolic (congestive) heart failure (HCC)    Chronic venous stasis 03/07/2019   COPD (chronic obstructive pulmonary disease) (HCC)    Coronary artery calcification seen on CAT scan    Depression    Diabetic neuropathy (HCC) 09/11/2019   History of colon polyps 08/24/2018   Hypertension    Morbid obesity (HCC)    OSA (obstructive sleep apnea)    Pain due to onychomycosis of toenails of both feet 09/11/2019   Peripheral neuropathy 02/22/2017   Primary osteoarthritis, left shoulder 03/05/2017   PTSD (post-traumatic stress disorder)    Pure hypercholesterolemia    QT prolongation 03/07/2019   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 Lynnette Caffey and Dr. Laneta Simmers   Seizures [facility])     Severe aortic stenosis    Sinus tachycardia 03/07/2019   Sleep apnea    CPAP   Type 2 diabetes mellitus with vascular disease (HCC) 09/11/2019     FAMILY HISTORY:   Family History  Problem Relation Age of Onset   CAD Maternal Grandfather    Diabetes Other    Diabetes Mellitus II Neg Hx    Colon cancer Neg Hx    Esophageal cancer Neg Hx    Inflammatory bowel disease Neg Hx    Liver disease Neg Hx    Pancreatic cancer Neg Hx    Rectal cancer Neg Hx    Stomach cancer Neg Hx    Sleep apnea Neg Hx     SOCIAL HISTORY:   Social History   Tobacco Use   Smoking status: Former    Current packs/day: 0.00    Average packs/day: 1 pack/day for 44.0 years (44.0 ttl pk-yrs)    Types: Cigarettes    Start date: 03/07/1972    Quit date: 03/07/2016    Years since quitting: 7.4    Passive exposure: Past   Smokeless tobacco: Never  Substance Use Topics   Alcohol use: Yes    Comment: rare     ALLERGIES:   Allergies  Allergen Reactions   Vancomycin Other (See Comments)    "Red Man Syndrome" 02/02/17: possible cause for rash under both arms   Niacin And  Related Other (See Comments)    Red man syndrome   Tubersol [Tuberculin, Ppd] Other (See Comments)    Reaction unknown   Doxycycline Rash and Other (See Comments)     CURRENT MEDICATIONS:   Current Outpatient Medications  Medication Sig Dispense Refill   acetaminophen (TYLENOL) 650 MG CR tablet Take 1 tablet (650 mg total) by mouth every 8 (eight) hours as needed for pain.     albuterol (VENTOLIN HFA) 108 (90 Base) MCG/ACT inhaler Inhale 2 puffs into the lungs every 6 (six) hours as needed for wheezing or shortness of breath. 1 each 6   allopurinol (ZYLOPRIM) 100 MG tablet Take 1 tablet (100 mg total) by mouth daily.     apixaban (ELIQUIS) 5 MG TABS tablet Take 1 tablet (5 mg total) by mouth 2 (two) times daily. 180 tablet 3   ARIPiprazole (ABILIFY) 5 MG tablet Take 1 tablet (5 mg total) by mouth at bedtime. (Patient taking  differently: Take 5 mg by mouth every evening.) 60 tablet 0   atorvastatin (LIPITOR) 40 MG tablet Take 40 mg by mouth at bedtime.     bisacodyl (DULCOLAX) 5 MG EC tablet Take 2 tablets (10 mg total) by mouth daily as needed for moderate constipation.     busPIRone (BUSPAR) 10 MG tablet Take 1 tablet (10 mg total) by mouth 3 (three) times daily. 270 tablet 0   colchicine 0.6 MG tablet Take 1 tablet (0.6 mg total) by mouth daily. 30 tablet 0   cyanocobalamin (VITAMIN B12) 1000 MCG tablet Take 1,000 mcg by mouth daily.     fenofibrate (TRICOR) 145 MG tablet Take 1 tablet (145 mg total) by mouth daily. 90 tablet 3   ferrous sulfate 324 (65 Fe) MG TBEC Take 324 mg by mouth daily with breakfast.      fluticasone (FLONASE) 50 MCG/ACT nasal spray Place 2 sprays into both nostrils daily.      Fluticasone-Umeclidin-Vilant (TRELEGY ELLIPTA) 200-62.5-25 MCG/ACT AEPB INHALE 1 PUFF BY MOUTH INTO LUNGS ONCE DAILY 60 each 11   icosapent Ethyl (VASCEPA) 1 g capsule Take 2 capsules (2 g total) by mouth 2 (two) times daily. 120 capsule 3   insulin aspart (NOVOLOG) 100 UNIT/ML injection Inject 0-15 Units into the skin 3 (three) times daily with meals. 0-15 Units, Subcutaneous, 3 times daily with meals, First dose on Sun 07/23/23 at 1700 Correction coverage: Moderate (average weight, post-op) CBG < 70: Implement Hypoglycemia Standing Orders and refer to Hypoglycemia Standing Orders sidebar report CBG 70 - 120: 0 units CBG 121 - 150: 2 units CBG 151 - 200: 3 units CBG 201 - 250: 5 units CBG 251 - 300: 8 units CBG 301 - 350: 11 units CBG 351 - 400: 15 units CBG > 400: call MD and obtain STAT lab verification     insulin aspart (NOVOLOG) 100 UNIT/ML injection Inject 0-5 Units into the skin at bedtime. 0-5 Units, Subcutaneous, Daily at bedtime, First dose on Sun 07/23/23 at 2200 Correction coverage: HS scale CBG < 70: Implement Hypoglycemia Standing Orders and refer to Hypoglycemia Standing Orders sidebar report CBG 70 - 120: 0  units CBG 121 - 150: 0 units CBG 151 - 200: 0 units CBG 201 - 250: 2 units CBG 251 - 300: 3 units CBG 301 - 350: 4 units CBG 351 - 400: 5 units 10 mL 11   insulin degludec (TRESIBA FLEXTOUCH) 100 UNIT/ML FlexTouch Pen Inject 25 Units into the skin daily.     Lacosamide 150 MG  TABS Take 1 tablet (150 mg total) by mouth 2 (two) times daily. 180 tablet 3   levETIRAcetam (KEPPRA) 500 MG tablet Take 1 tablet (500 mg total) by mouth 2 (two) times daily. 180 tablet 3   Menthol-Methyl Salicylate (MUSCLE RUB) 10-15 % CREA Apply 1 Application topically as needed for muscle pain (to knees for pain).     methocarbamol (ROBAXIN) 500 MG tablet Take 1 tablet (500 mg total) by mouth every 8 (eight) hours as needed for muscle spasms.     metoprolol succinate (TOPROL-XL) 50 MG 24 hr tablet Take 50 mg by mouth daily.     mirtazapine (REMERON) 15 MG tablet Take 15 mg by mouth at bedtime.     omeprazole (PRILOSEC) 20 MG capsule Take 20 mg by mouth every morning.     polyethylene glycol powder (GLYCOLAX/MIRALAX) 17 GM/SCOOP powder Mix in water as directed and take 1 capful (17 g) by mouth daily. 238 g 0   potassium chloride SA (KLOR-CON M20) 20 MEQ tablet Take 2 tablets (40 mEq total) by mouth daily.     pregabalin (LYRICA) 75 MG capsule Take 1 capsule (75 mg total) by mouth 2 (two) times daily. 30 capsule 0   rOPINIRole (REQUIP) 0.25 MG tablet Take 1 tablet (0.25 mg total) by mouth 2 (two) times daily.     rosuvastatin (CRESTOR) 40 MG tablet Take 1 tablet (40 mg total) by mouth daily. (Patient taking differently: Take 40 mg by mouth at bedtime.) 30 tablet 0   senna-docusate (SENOKOT-S) 8.6-50 MG tablet Take 2 tablets by mouth 2 (two) times daily.     tirzepatide (MOUNJARO) 15 MG/0.5ML Pen INJECT 15 MG INTO THE SKIN ONCE A WEEK. (Patient taking differently: 15 mg once a week. Inject on Monday) 6 mL 0   torsemide (DEMADEX) 20 MG tablet Take 3 tablets (60 mg total) by mouth 2 (two) times daily.     venlafaxine XR  (EFFEXOR-XR) 75 MG 24 hr capsule Take 2 capsules (150 mg total) by mouth daily with breakfast.     No current facility-administered medications for this visit.    REVIEW OF SYSTEMS:   [X]  denotes positive finding, [ ]  denotes negative finding Cardiac  Comments:  Chest pain or chest pressure:    Shortness of breath upon exertion:    Short of breath when lying flat:    Irregular heart rhythm:        Vascular    Pain in calf, thigh, or hip brought on by ambulation:    Pain in feet at night that wakes you up from your sleep:     Blood clot in your veins:    Leg swelling:  x       Pulmonary    Oxygen at home:    Productive cough:     Wheezing:         Neurologic    Sudden weakness in arms or legs:     Sudden numbness in arms or legs:     Sudden onset of difficulty speaking or slurred speech:    Temporary loss of vision in one eye:     Problems with dizziness:         Gastrointestinal    Blood in stool:     Vomited blood:         Genitourinary    Burning when urinating:     Blood in urine:        Psychiatric    Major depression:  Hematologic    Bleeding problems:    Problems with blood clotting too easily:        Skin    Rashes or ulcers:        Constitutional    Fever or chills:      PHYSICAL EXAM:   Vitals:   08/28/23 1023  BP: 103/69  Pulse: 77  Resp: 20  Temp: 98.1 F (36.7 C)  SpO2: 94%    GENERAL: The patient is a well-nourished male, in no acute distress. The vital signs are documented above. CARDIAC: There is a regular rate and rhythm.  VASCULAR: Bilateral edema.  No active ulcers PULMONARY: Non-labored respirations MUSCULOSKELETAL: There are no major deformities or cyanosis. NEUROLOGIC: No focal weakness or paresthesias are detected. SKIN: There are no ulcers or rashes noted. PSYCHIATRIC: The patient has a normal affect.  STUDIES:   None  MEDICAL ISSUES:   CEAP class 5: Currently, the patient is without active ulceration.  He  has undergone successful bilateral lower extremity saphenous vein ablation.  There are no indications for intervention at this time.  I will bring him back in 1 year for repeat bilateral reflux studies to make sure that his saphenous veins remain closed and that there are no significant issues with his anterior accessory veins.  In the meantime, he will continue to keep his legs elevated and wear his compression.    Charlena Cross, MD, FACS [facility] [phone] Pager [phone]

## 2023-08-31 ENCOUNTER — Ambulatory Visit: Payer: Medicare HMO | Admitting: "Endocrinology

## 2023-09-07 ENCOUNTER — Other Ambulatory Visit: Payer: Self-pay | Admitting: Adult Health

## 2023-09-07 DIAGNOSIS — F411 Generalized anxiety disorder: Secondary | ICD-10-CM

## 2023-09-07 DIAGNOSIS — F331 Major depressive disorder, recurrent, moderate: Secondary | ICD-10-CM

## 2023-09-07 NOTE — Telephone Encounter (Signed)
Please schedule pt an appt lv 09/4- cancled 12/4 appt.

## 2023-09-07 NOTE — Telephone Encounter (Signed)
Pt is scheduled 09/14/23

## 2023-09-14 ENCOUNTER — Encounter: Payer: Self-pay | Admitting: Adult Health

## 2023-09-14 ENCOUNTER — Ambulatory Visit: Payer: 59 | Admitting: Adult Health

## 2023-09-14 DIAGNOSIS — F331 Major depressive disorder, recurrent, moderate: Secondary | ICD-10-CM | POA: Diagnosis not present

## 2023-09-14 DIAGNOSIS — F4310 Post-traumatic stress disorder, unspecified: Secondary | ICD-10-CM

## 2023-09-14 DIAGNOSIS — G4700 Insomnia, unspecified: Secondary | ICD-10-CM

## 2023-09-14 DIAGNOSIS — F410 Panic disorder [episodic paroxysmal anxiety] without agoraphobia: Secondary | ICD-10-CM

## 2023-09-14 DIAGNOSIS — F411 Generalized anxiety disorder: Secondary | ICD-10-CM | POA: Diagnosis not present

## 2023-09-14 NOTE — Progress Notes (Signed)
 Lawrence [name] [id number] 1956-03-20 68 y.o.  Subjective:   Patient ID:  Lawrence [name] is a 68 y.o. (DOB 09-16-1955) male.  Chief Complaint: No chief complaint on file.   HPI Lawrence [name] presents to the office today for follow-up of panic attacks, MDD, GAD, PTSD, and insomnia.  Previously followed by [place].  Describes mood today as "so-so". Pleasant. Flat. Reports tearfulness. Mood symptoms - reports some depression with situational stressors. Reports anxiety about his health and future - reports another stroke. Reports irritability at times. Reports decreased panic attacks. Reports some over thinking. Denies worry and rumination. Mood is lower. Reports recent hospitalizations. Stating "I'm not doing too well physically". Reports possibly move to a lon term care facility due to his acuity. Would like to remain at home, but is uncertain if he can. Feels like current medications are helpful. He and partner doing well. Decreased interest and motivation. Taking medications as prescribed.  Energy levels varies. Active, does not have a regular exercise routine.   Enjoys some usual interests and activities. Lives with significant other. No family local. Has a good friend locally. Appetite adequate. Weight loss 360 pounds. Sleeps better some nights than others. Averages 4 to 6 hours a night. Focus and concentration difficulties. Completing tasks. Managing aspects of household. Retired [employer]. Disabled. Denies SI or HI.  Denies AH or VH. Denies self harm. Denies substance use.  Previous medication trials: Ambien    Flowsheet Row ED to Hosp-Admission (Discharged) from 07/21/2023 in [place] [facility] ED to Hosp-Admission (Discharged) from 07/14/2023 in [facility] (Discharged) from 06/28/2023 in [facility] CENTER B  C-SSRS RISK CATEGORY No Risk No Risk No Risk        Review of Systems:  Review of Systems   Musculoskeletal:  Negative for gait problem.  Neurological:  Negative for tremors.  Psychiatric/Behavioral:         Please refer to HPI    Medications: I have reviewed the patient's current medications.  Current Outpatient Medications  Medication Sig Dispense Refill   acetaminophen  (TYLENOL ) 650 MG CR tablet Take 1 tablet (650 mg total) by mouth every 8 (eight) hours as needed for pain.     albuterol  (VENTOLIN  HFA) 108 (90 Base) MCG/ACT inhaler Inhale 2 puffs into the lungs every 6 (six) hours as needed for wheezing or shortness of breath. 1 each 6   allopurinol  (ZYLOPRIM ) 100 MG tablet Take 1 tablet (100 mg total) by mouth daily.     apixaban  (ELIQUIS ) 5 MG TABS tablet Take 1 tablet (5 mg total) by mouth 2 (two) times daily. 180 tablet 3   ARIPiprazole  (ABILIFY ) 5 MG tablet TAKE 1 TABLET BY MOUTH EVERYDAY AT BEDTIME 30 tablet 0   atorvastatin  (LIPITOR) 40 MG tablet Take 40 mg by mouth at bedtime.     bisacodyl  (DULCOLAX) 5 MG EC tablet Take 2 tablets (10 mg total) by mouth daily as needed for moderate constipation.     busPIRone  (BUSPAR ) 10 MG tablet Take 1 tablet (10 mg total) by mouth 3 (three) times daily. 270 tablet 0   colchicine  0.6 MG tablet Take 1 tablet (0.6 mg total) by mouth daily. 30 tablet 0   cyanocobalamin  (VITAMIN B12) 1000 MCG tablet Take 1,000 mcg by mouth daily.     fenofibrate  (TRICOR ) 145 MG tablet Take 1 tablet (145 mg total) by mouth daily. 90 tablet 3   ferrous sulfate  324 (65 Fe) MG TBEC  Take 324 mg by mouth daily with breakfast.      fluticasone  (FLONASE ) 50 MCG/ACT nasal spray Place 2 sprays into both nostrils daily.      Fluticasone -Umeclidin-Vilant (TRELEGY ELLIPTA ) 200-62.5-25 MCG/ACT AEPB INHALE 1 PUFF BY MOUTH INTO LUNGS ONCE DAILY 60 each 11   icosapent  Ethyl (VASCEPA ) 1 g capsule Take 2 capsules (2 g total) by mouth 2 (two) times daily. 120 capsule 3   insulin  aspart (NOVOLOG ) 100 UNIT/ML injection Inject 0-15 Units into the skin 3 (three) times daily  with meals. 0-15 Units, Subcutaneous, 3 times daily with meals, First dose on Sun 07/23/23 at 1700 Correction coverage: Moderate (average weight, post-op) CBG < 70: Implement Hypoglycemia Standing Orders and refer to Hypoglycemia Standing Orders sidebar report CBG 70 - 120: 0 units CBG 121 - 150: 2 units CBG 151 - 200: 3 units CBG 201 - 250: 5 units CBG 251 - 300: 8 units CBG 301 - 350: 11 units CBG 351 - 400: 15 units CBG > 400: call MD and obtain STAT lab verification     insulin  aspart (NOVOLOG ) 100 UNIT/ML injection Inject 0-5 Units into the skin at bedtime. 0-5 Units, Subcutaneous, Daily at bedtime, First dose on Sun 07/23/23 at 2200 Correction coverage: HS scale CBG < 70: Implement Hypoglycemia Standing Orders and refer to Hypoglycemia Standing Orders sidebar report CBG 70 - 120: 0 units CBG 121 - 150: 0 units CBG 151 - 200: 0 units CBG 201 - 250: 2 units CBG 251 - 300: 3 units CBG 301 - 350: 4 units CBG 351 - 400: 5 units 10 mL 11   insulin  degludec (TRESIBA  FLEXTOUCH) 100 UNIT/ML FlexTouch Pen Inject 25 Units into the skin daily.     Lacosamide  150 MG TABS Take 1 tablet (150 mg total) by mouth 2 (two) times daily. 180 tablet 3   levETIRAcetam  (KEPPRA ) 500 MG tablet Take 1 tablet (500 mg total) by mouth 2 (two) times daily. 180 tablet 3   Menthol -Methyl Salicylate  (MUSCLE RUB) 10-15 % CREA Apply 1 Application topically as needed for muscle pain (to knees for pain).     methocarbamol  (ROBAXIN ) 500 MG tablet Take 1 tablet (500 mg total) by mouth every 8 (eight) hours as needed for muscle spasms.     metoprolol  succinate (TOPROL -XL) 50 MG 24 hr tablet Take 50 mg by mouth daily.     mirtazapine  (REMERON ) 15 MG tablet Take 15 mg by mouth at bedtime.     omeprazole (PRILOSEC) 20 MG capsule Take 20 mg by mouth every morning.     polyethylene glycol powder (GLYCOLAX /MIRALAX ) 17 GM/SCOOP powder Mix in water as directed and take 1 capful (17 g) by mouth daily. 238 g 0   potassium chloride  SA (KLOR-CON  M20)  20 MEQ tablet Take 2 tablets (40 mEq total) by mouth daily.     pregabalin  (LYRICA ) 75 MG capsule Take 1 capsule (75 mg total) by mouth 2 (two) times daily. 30 capsule 0   rOPINIRole  (REQUIP ) 0.25 MG tablet Take 1 tablet (0.25 mg total) by mouth 2 (two) times daily.     rosuvastatin  (CRESTOR ) 40 MG tablet Take 1 tablet (40 mg total) by mouth daily. (Patient taking differently: Take 40 mg by mouth at bedtime.) 30 tablet 0   senna-docusate (SENOKOT-S) 8.6-50 MG tablet Take 2 tablets by mouth 2 (two) times daily.     tirzepatide  (MOUNJARO ) 15 MG/0.5ML Pen INJECT 15 MG INTO THE SKIN ONCE A WEEK. (Patient taking differently: 15 mg once a week.  Inject on Monday) 6 mL 0   torsemide  (DEMADEX ) 20 MG tablet Take 3 tablets (60 mg total) by mouth 2 (two) times daily.     venlafaxine  XR (EFFEXOR -XR) 75 MG 24 hr capsule Take 2 capsules (150 mg total) by mouth daily with breakfast.     No current facility-administered medications for this visit.    Medication Side Effects: None  Allergies:  Allergies  Allergen Reactions   Vancomycin  Other (See Comments)    "Red Man Syndrome" 02/02/17: possible cause for rash under both arms   Niacin And Related Other (See Comments)    Red man syndrome   Tubersol [Tuberculin, Ppd] Other (See Comments)    Reaction unknown   Doxycycline  Rash and Other (See Comments)    Past Medical History:  Diagnosis Date   Acquired dilation of ascending aorta and aortic root (HCC)    40mm by echo 01/2021   Adenomatous colon polyp 2007   Anemia    Anxiety    Asthma    BPH without obstruction/lower urinary tract symptoms 02/22/2017   Chronic diastolic (congestive) heart failure (HCC)    Chronic venous stasis 03/07/2019   COPD (chronic obstructive pulmonary disease) (HCC)    Coronary artery calcification seen on CAT scan    Depression    Diabetic neuropathy (HCC) 09/11/2019   History of colon polyps 08/24/2018   Hypertension    Morbid obesity (HCC)    OSA (obstructive sleep  apnea)    Pain due to onychomycosis of toenails of both feet 09/11/2019   Peripheral neuropathy 02/22/2017   Primary osteoarthritis, left shoulder 03/05/2017   PTSD (post-traumatic stress disorder)    Pure hypercholesterolemia    QT prolongation 03/07/2019   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 Lorie Rook and Dr. Sherene Dilling   Seizures [facility])    Severe aortic stenosis    Sinus tachycardia 03/07/2019   Sleep apnea    CPAP   Type 2 diabetes mellitus with vascular disease (HCC) 09/11/2019    Past Medical History, Surgical history, Social history, and Family history were reviewed and updated as appropriate.   Please see review of systems for further details on the patient's review from today.   Objective:   Physical Exam:  There were no vitals taken for this visit.  Physical Exam Constitutional:      General: He is not in acute distress. Musculoskeletal:        General: No deformity.  Neurological:     Mental Status: He is alert and oriented to person, place, and time.     Coordination: Coordination normal.  Psychiatric:        Attention and Perception: Attention and perception normal. He does not perceive auditory or visual hallucinations.        Mood and Affect: Mood is anxious and depressed. Affect is flat. Affect is not labile, blunt, angry or inappropriate.        Speech: Speech normal.        Behavior: Behavior normal.        Thought Content: Thought content normal. Thought content is not paranoid or delusional. Thought content does not include homicidal or suicidal ideation. Thought content does not include homicidal or suicidal plan.        Cognition and Memory: Cognition and memory normal.        Judgment: Judgment normal.     Comments: Insight intact     Lab Review:  Component Value Date/Time   NA 137 07/24/2023 0416   NA 136 02/15/2023 1415   K 3.6 07/24/2023 0416   CL 107 07/24/2023 0416   CO2 24  07/24/2023 0416   GLUCOSE 145 (H) 07/24/2023 0416   BUN 13 07/24/2023 0416   BUN 37 (H) 02/15/2023 1415   CREATININE 0.87 07/24/2023 0416   CALCIUM  9.2 07/24/2023 0416   PROT 7.1 07/21/2023 2137   ALBUMIN 3.9 07/21/2023 2137   AST 22 07/21/2023 2137   ALT 33 07/21/2023 2137   ALKPHOS 71 07/21/2023 2137   BILITOT 0.7 07/21/2023 2137   GFRNONAA >60 07/24/2023 0416   GFRAA 77 05/20/2020 1038       Component Value Date/Time   WBC 8.6 07/24/2023 0416   RBC 4.65 07/24/2023 0416   HGB 13.5 07/24/2023 0416   HCT 41.0 07/24/2023 0416   PLT 207 07/24/2023 0416   MCV 88.2 07/24/2023 0416   MCH 29.0 07/24/2023 0416   MCHC 32.9 07/24/2023 0416   RDW 14.6 07/24/2023 0416   LYMPHSABS 1.4 07/21/2023 2137   MONOABS 0.7 07/21/2023 2137   EOSABS 0.4 07/21/2023 2137   BASOSABS 0.1 07/21/2023 2137    No results found for: "POCLITH", "LITHIUM"   No results found for: "PHENYTOIN", "PHENOBARB", "VALPROATE", "CBMZ"   .res Assessment: Plan:    Plan:  PDMP reviewed  Prazosin  2mg  - 2 tabs at hs Abilify  5mg  daily Effexor  XR 75mg  - 3 every morning Buspar  5mg  TID  Mirtazapine  15mg  at hs Trazadone 200mg  at hs  Solomon Dupre - [facility] - pain management - spinal stimulator  RTC 3 months  Patient advised to contact office with any questions, adverse effects, or acute worsening in signs and symptoms.  Discussed potential metabolic side effects associated with atypical antipsychotics, as well as potential risk for movement side effects. Advised pt to contact office if movement side effects occur.   There are no diagnoses linked to this encounter.   Please see After Visit Summary for patient specific instructions.  Future Appointments  Date Time Provider Department Center  09/14/2023  9:15 AM Maiko Salais, Ursula Gardner, NP [facility] None  09/21/2023  8:15 AM Ruffin Cotton, DPM [facility]  01/17/2024  1:05 PM [facility] ECHO 5 [facility]  01/17/2024  2:20 PM [facility]  [facility] HE[facility]  05/09/2024  9:15 AM Cassandra Cleveland, MD [facility] None    No orders of the defined types were placed in this encounter.   -------------------------------

## 2023-09-18 ENCOUNTER — Telehealth (HOSPITAL_COMMUNITY): Payer: Self-pay

## 2023-09-18 ENCOUNTER — Other Ambulatory Visit (HOSPITAL_COMMUNITY): Payer: Self-pay

## 2023-09-18 NOTE — Telephone Encounter (Signed)
 Advanced Heart Failure Patient Advocate Encounter  Received notification from [employer] that prior authorization is required for Icosapent . Review of plan shows that brand Vascepa  is preferred.  Test billing for Vascepa  (DAW 9) 1 gm returns refill too soon rejection, 0.5 gm returns $0 copay for 30 or 90 days.  No prior authorization submitted at this time.  Kennis Peacock, CPhT Rx Patient Advocate Phone: [phone]

## 2023-09-19 ENCOUNTER — Other Ambulatory Visit: Payer: Self-pay

## 2023-09-19 DIAGNOSIS — I872 Venous insufficiency (chronic) (peripheral): Secondary | ICD-10-CM

## 2023-09-20 DIAGNOSIS — G459 Transient cerebral ischemic attack, unspecified: Secondary | ICD-10-CM | POA: Diagnosis not present

## 2023-09-20 DIAGNOSIS — I5032 Chronic diastolic (congestive) heart failure: Secondary | ICD-10-CM | POA: Diagnosis not present

## 2023-09-20 DIAGNOSIS — E1151 Type 2 diabetes mellitus with diabetic peripheral angiopathy without gangrene: Secondary | ICD-10-CM | POA: Diagnosis not present

## 2023-09-20 DIAGNOSIS — L989 Disorder of the skin and subcutaneous tissue, unspecified: Secondary | ICD-10-CM | POA: Diagnosis not present

## 2023-09-20 DIAGNOSIS — E876 Hypokalemia: Secondary | ICD-10-CM | POA: Diagnosis not present

## 2023-09-20 DIAGNOSIS — N179 Acute kidney failure, unspecified: Secondary | ICD-10-CM | POA: Diagnosis not present

## 2023-09-20 DIAGNOSIS — I4819 Other persistent atrial fibrillation: Secondary | ICD-10-CM | POA: Diagnosis not present

## 2023-09-20 DIAGNOSIS — G63 Polyneuropathy in diseases classified elsewhere: Secondary | ICD-10-CM | POA: Diagnosis not present

## 2023-09-21 ENCOUNTER — Ambulatory Visit: Payer: Medicaid Other | Admitting: Podiatry

## 2023-09-30 ENCOUNTER — Emergency Department (HOSPITAL_COMMUNITY): Payer: No Typology Code available for payment source

## 2023-09-30 ENCOUNTER — Emergency Department (HOSPITAL_COMMUNITY)
Admission: EM | Admit: 2023-09-30 | Discharge: 2023-09-30 | Disposition: A | Discharge status: Home / Self Care | Payer: No Typology Code available for payment source | Attending: Emergency Medicine | Admitting: Emergency Medicine

## 2023-09-30 ENCOUNTER — Encounter (HOSPITAL_COMMUNITY): Payer: Self-pay

## 2023-09-30 ENCOUNTER — Other Ambulatory Visit: Payer: Self-pay

## 2023-09-30 DIAGNOSIS — Z794 Long term (current) use of insulin: Secondary | ICD-10-CM | POA: Insufficient documentation

## 2023-09-30 DIAGNOSIS — R000 Tachycardia, unspecified: Secondary | ICD-10-CM | POA: Diagnosis not present

## 2023-09-30 DIAGNOSIS — R531 Weakness: Secondary | ICD-10-CM | POA: Diagnosis not present

## 2023-09-30 DIAGNOSIS — I480 Paroxysmal atrial fibrillation: Secondary | ICD-10-CM | POA: Insufficient documentation

## 2023-09-30 DIAGNOSIS — R079 Chest pain, unspecified: Secondary | ICD-10-CM

## 2023-09-30 DIAGNOSIS — J449 Chronic obstructive pulmonary disease, unspecified: Secondary | ICD-10-CM | POA: Diagnosis not present

## 2023-09-30 DIAGNOSIS — I10 Essential (primary) hypertension: Secondary | ICD-10-CM | POA: Diagnosis not present

## 2023-09-30 DIAGNOSIS — Z7901 Long term (current) use of anticoagulants: Secondary | ICD-10-CM | POA: Diagnosis not present

## 2023-09-30 DIAGNOSIS — R600 Localized edema: Secondary | ICD-10-CM | POA: Diagnosis not present

## 2023-09-30 DIAGNOSIS — Z79899 Other long term (current) drug therapy: Secondary | ICD-10-CM | POA: Diagnosis not present

## 2023-09-30 DIAGNOSIS — Z743 Need for continuous supervision: Secondary | ICD-10-CM | POA: Diagnosis not present

## 2023-09-30 DIAGNOSIS — I4891 Unspecified atrial fibrillation: Secondary | ICD-10-CM | POA: Diagnosis not present

## 2023-09-30 DIAGNOSIS — I509 Heart failure, unspecified: Secondary | ICD-10-CM | POA: Diagnosis not present

## 2023-09-30 DIAGNOSIS — I2510 Atherosclerotic heart disease of native coronary artery without angina pectoris: Secondary | ICD-10-CM | POA: Insufficient documentation

## 2023-09-30 DIAGNOSIS — Z7401 Bed confinement status: Secondary | ICD-10-CM | POA: Diagnosis not present

## 2023-09-30 DIAGNOSIS — I110 Hypertensive heart disease with heart failure: Secondary | ICD-10-CM | POA: Insufficient documentation

## 2023-09-30 LAB — BASIC METABOLIC PANEL
Anion gap: 12 (ref 5–15)
BUN: 24 mg/dL — ABNORMAL HIGH (ref 8–23)
CO2: 27 mmol/L (ref 22–32)
Calcium: 9.8 mg/dL (ref 8.9–10.3)
Chloride: 103 mmol/L (ref 98–111)
Creatinine, Ser: 1.22 mg/dL (ref 0.61–1.24)
GFR, Estimated: >60 mL/min (ref >60)
Glucose, Bld: 92 mg/dL (ref 70–99)
Potassium: 4.1 mmol/L (ref 3.5–5.1)
Sodium: 142 mmol/L (ref 135–145)

## 2023-09-30 LAB — CBC WITH DIFFERENTIAL/PLATELET
Abs Immature Granulocytes: 0.13 10*3/uL — ABNORMAL HIGH (ref 0.00–0.07)
Basophils Absolute: 0.1 10*3/uL (ref 0.0–0.1)
Basophils Relative: 1 %
Eosinophils Absolute: 0.6 10*3/uL — ABNORMAL HIGH (ref 0.0–0.5)
Eosinophils Relative: 5 %
HCT: 43 % (ref 39.0–52.0)
Hemoglobin: 13.9 g/dL (ref 13.0–17.0)
Immature Granulocytes: 1 %
Lymphocytes Relative: 14 %
Lymphs Abs: 1.7 10*3/uL (ref 0.7–4.0)
MCH: 29.9 pg (ref 26.0–34.0)
MCHC: 32.3 g/dL (ref 30.0–36.0)
MCV: 92.5 fL (ref 80.0–100.0)
Monocytes Absolute: 0.9 10*3/uL (ref 0.1–1.0)
Monocytes Relative: 8 %
Neutro Abs: 8.2 10*3/uL — ABNORMAL HIGH (ref 1.7–7.7)
Neutrophils Relative %: 71 %
Platelets: 267 10*3/uL (ref 150–400)
RBC: 4.65 MIL/uL (ref 4.22–5.81)
RDW: 15.3 % (ref 11.5–15.5)
WBC: 11.5 10*3/uL — ABNORMAL HIGH (ref 4.0–10.5)
nRBC: 0 % (ref 0.0–0.2)

## 2023-09-30 LAB — TROPONIN I (HIGH SENSITIVITY)
Troponin I (High Sensitivity): 11 ng/L (ref <18)
Troponin I (High Sensitivity): 11 ng/L (ref <18)

## 2023-09-30 LAB — BRAIN NATRIURETIC PEPTIDE: B Natriuretic Peptide: 24.4 pg/mL (ref 0.0–100.0)

## 2023-09-30 LAB — MAGNESIUM: Magnesium: 2.1 mg/dL (ref 1.7–2.4)

## 2023-09-30 MED ORDER — OXYCODONE HCL 5 MG PO TABS
5.0000 mg | ORAL_TABLET | Freq: Once | ORAL | Status: AC
  Administered 2023-09-30: 5 mg via ORAL
  Filled 2023-09-30: qty 1

## 2023-09-30 NOTE — ED Provider Notes (Signed)
 Lawrence [name] EMERGENCY DEPARTMENT AT [facility] Provider Note   CSN: [id number] Arrival date & time: 09/30/23  1811     History {Add pertinent medical, surgical, social history, OB history to HPI:1} Chief Complaint  Patient presents with  . Chest Pain    Lawrence [name] is a 68 y.o. male.  Patient is a 68 year old male with a past medical history of paroxysmal A-fib on Eliquis, CAD, COPD, hypertension, CHF presenting to the emergency department with chest pain.  His chest pain started about 4 hours prior to arrival while he was at rest watching TV.  He states that the pain radiated down his left arm to his elbow.  He states that the pain feels similar to when he has been in A-fib in the past.  Per EMS, the patient was in A-fib on their arrival and spontaneously converted to sinus and route.  He reports that his symptoms did improve though he still having some chest pain at this time.  He denies any increased shortness of breath from his baseline.  Does report some increased swelling in his legs over the last few days.  Denies any recent vomiting or diarrhea.  States he has been taking his medications as prescribed.  The history is provided by the patient and the EMS personnel.  Chest Pain      Home Medications Prior to Admission medications   Medication Sig Start Date End Date Taking? Authorizing Provider  acetaminophen (TYLENOL) 650 MG CR tablet Take 1 tablet (650 mg total) by mouth every 8 (eight) hours as needed for pain. 07/10/23   Love, Evlyn Kanner, PA-C  albuterol (VENTOLIN HFA) 108 (90 Base) MCG/ACT inhaler Inhale 2 puffs into the lungs every 6 (six) hours as needed for wheezing or shortness of breath. 04/17/23   Hunsucker, Lesia Sago, MD  allopurinol (ZYLOPRIM) 100 MG tablet Take 1 tablet (100 mg total) by mouth daily. 07/25/23 07/24/24  Elgergawy, Leana Roe, MD  apixaban (ELIQUIS) 5 MG TABS tablet Take 1 tablet (5 mg total) by mouth 2 (two) times daily. 04/17/23   Laurey Morale, MD  ARIPiprazole (ABILIFY) 5 MG tablet TAKE 1 TABLET BY MOUTH EVERYDAY AT BEDTIME 09/07/23   Mozingo, Thereasa Solo, NP  atorvastatin (LIPITOR) 40 MG tablet Take 40 mg by mouth at bedtime. 07/14/23   [provider]  bisacodyl (DULCOLAX) 5 MG EC tablet Take 2 tablets (10 mg total) by mouth daily as needed for moderate constipation. 07/25/23   Elgergawy, Leana Roe, MD  busPIRone (BUSPAR) 10 MG tablet Take 1 tablet (10 mg total) by mouth 3 (three) times daily. 04/17/23   Mozingo, Thereasa Solo, NP  colchicine 0.6 MG tablet Take 1 tablet (0.6 mg total) by mouth daily. 07/12/23   Love, Evlyn Kanner, PA-C  cyanocobalamin (VITAMIN B12) 1000 MCG tablet Take 1,000 mcg by mouth daily.    [provider]  fenofibrate (TRICOR) 145 MG tablet Take 1 tablet (145 mg total) by mouth daily. 04/17/23   Laurey Morale, MD  ferrous sulfate 324 (65 Fe) MG TBEC Take 324 mg by mouth daily with breakfast.     [provider]  fluticasone (FLONASE) 50 MCG/ACT nasal spray Place 2 sprays into both nostrils daily.     [provider]  Fluticasone-Umeclidin-Vilant (TRELEGY ELLIPTA) 200-62.5-25 MCG/ACT AEPB INHALE 1 PUFF BY MOUTH INTO LUNGS ONCE DAILY 08/18/22   Hunsucker, Lesia Sago, MD  icosapent Ethyl (VASCEPA) 1 g capsule Take 2 capsules (2 g total) by mouth  2 (two) times daily. 04/17/23   Laurey Morale, MD  insulin aspart (NOVOLOG) 100 UNIT/ML injection Inject 0-15 Units into the skin 3 (three) times daily with meals. 0-15 Units, Subcutaneous, 3 times daily with meals, First dose on Sun 07/23/23 at 1700 Correction coverage: Moderate (average weight, post-op) CBG < 70: Implement Hypoglycemia Standing Orders and refer to Hypoglycemia Standing Orders sidebar report CBG 70 - 120: 0 units CBG 121 - 150: 2 units CBG 151 - 200: 3 units CBG 201 - 250: 5 units CBG 251 - 300: 8 units CBG 301 - 350: 11 units CBG 351 - 400: 15 units CBG > 400: call MD and obtain STAT lab verification 07/25/23    Elgergawy, Leana Roe, MD  insulin aspart (NOVOLOG) 100 UNIT/ML injection Inject 0-5 Units into the skin at bedtime. 0-5 Units, Subcutaneous, Daily at bedtime, First dose on Sun 07/23/23 at 2200 Correction coverage: HS scale CBG < 70: Implement Hypoglycemia Standing Orders and refer to Hypoglycemia Standing Orders sidebar report CBG 70 - 120: 0 units CBG 121 - 150: 0 units CBG 151 - 200: 0 units CBG 201 - 250: 2 units CBG 251 - 300: 3 units CBG 301 - 350: 4 units CBG 351 - 400: 5 units 07/25/23   Elgergawy, Leana Roe, MD  insulin degludec (TRESIBA FLEXTOUCH) 100 UNIT/ML FlexTouch Pen Inject 25 Units into the skin daily. 07/25/23   Elgergawy, Leana Roe, MD  Lacosamide 150 MG TABS Take 1 tablet (150 mg total) by mouth 2 (two) times daily. 05/04/23   Windell Norfolk, MD  levETIRAcetam (KEPPRA) 500 MG tablet Take 1 tablet (500 mg total) by mouth 2 (two) times daily. 05/04/23   Windell Norfolk, MD  Menthol-Methyl Salicylate (MUSCLE RUB) 10-15 % CREA Apply 1 Application topically as needed for muscle pain (to knees for pain). 07/10/23   Love, Evlyn Kanner, PA-C  methocarbamol (ROBAXIN) 500 MG tablet Take 1 tablet (500 mg total) by mouth every 8 (eight) hours as needed for muscle spasms. 07/25/23   Elgergawy, Leana Roe, MD  metoprolol succinate (TOPROL-XL) 50 MG 24 hr tablet Take 50 mg by mouth daily. 07/14/23   [provider]  mirtazapine (REMERON) 15 MG tablet Take 15 mg by mouth at bedtime. 07/14/23   [provider]  omeprazole (PRILOSEC) 20 MG capsule Take 20 mg by mouth every morning. 05/29/20   [provider]  polyethylene glycol powder (GLYCOLAX/MIRALAX) 17 GM/SCOOP powder Mix in water as directed and take 1 capful (17 g) by mouth daily. 07/12/23   Love, Evlyn Kanner, PA-C  potassium chloride SA (KLOR-CON M20) 20 MEQ tablet Take 2 tablets (40 mEq total) by mouth daily. 07/25/23   Elgergawy, Leana Roe, MD  pregabalin (LYRICA) 75 MG capsule Take 1 capsule (75 mg total) by mouth 2 (two) times daily.  07/25/23   Elgergawy, Leana Roe, MD  rOPINIRole (REQUIP) 0.25 MG tablet Take 1 tablet (0.25 mg total) by mouth 2 (two) times daily. 07/25/23   Elgergawy, Leana Roe, MD  rosuvastatin (CRESTOR) 40 MG tablet Take 1 tablet (40 mg total) by mouth daily. Patient taking differently: Take 40 mg by mouth at bedtime. 07/12/23   Love, Evlyn Kanner, PA-C  senna-docusate (SENOKOT-S) 8.6-50 MG tablet Take 2 tablets by mouth 2 (two) times daily. 07/25/23   Elgergawy, Leana Roe, MD  tirzepatide [facility]) 15 MG/0.5ML Pen INJECT 15 MG INTO THE SKIN ONCE A WEEK. Patient taking differently: 15 mg once a week. Inject on Monday 06/07/23   Altamese Loomis, MD  torsemide (DEMADEX) 20 MG tablet Take 3 tablets (60 mg total) by mouth 2 (two) times daily. 07/25/23   Elgergawy, Leana Roe, MD  venlafaxine XR (EFFEXOR-XR) 75 MG 24 hr capsule Take 2 capsules (150 mg total) by mouth daily with breakfast. 07/25/23   Elgergawy, Leana Roe, MD      Allergies    Vancomycin; Niacin and related; Tubersol [tuberculin, ppd]; and Doxycycline    Review of Systems   Review of Systems  Cardiovascular:  Positive for chest pain.    Physical Exam Updated Vital Signs BP 111/68   Temp 97.7 F (36.5 C) (Oral)   Resp 16   Ht 6\' 1"  (1.854 m)   Wt (!) 156.5 kg   BMI 45.52 kg/m  Physical Exam Vitals and nursing note reviewed.  Constitutional:      General: He is not in acute distress.    Appearance: He is well-developed. He is obese.  HENT:     Head: Normocephalic.  Eyes:     Extraocular Movements: Extraocular movements intact.  Cardiovascular:     Rate and Rhythm: Normal rate and regular rhythm.     Pulses:          Radial pulses are 2+ on the right side and 2+ on the left side.     Heart sounds: Normal heart sounds.  Pulmonary:     Effort: Pulmonary effort is normal.     Breath sounds: Normal breath sounds.  Chest:     Chest wall: No tenderness.  Abdominal:     Palpations: Abdomen is soft.     Tenderness: There is no abdominal  tenderness.  Musculoskeletal:        General: Normal range of motion.     Cervical back: Normal range of motion and neck supple.     Right lower leg: Edema (trace, non-pitting) present.     Left lower leg: Edema (trace, non-pitting) present.  Skin:    General: Skin is warm and dry.  Neurological:     General: No focal deficit present.     Mental Status: He is alert and oriented to person, place, and time.  Psychiatric:        Mood and Affect: Mood normal.        Behavior: Behavior normal.    ED Results / Procedures / Treatments   Labs (all labs ordered are listed, but only abnormal results are displayed) Labs Reviewed  CBC WITH DIFFERENTIAL/PLATELET  BASIC METABOLIC PANEL  MAGNESIUM  BRAIN NATRIURETIC PEPTIDE  TROPONIN I (HIGH SENSITIVITY)    EKG None  Radiology No results found.  Procedures Procedures  {Document cardiac monitor, telemetry assessment procedure when appropriate:1}  Medications Ordered in ED Medications  oxyCODONE (Oxy IR/ROXICODONE) immediate release tablet 5 mg (has no administration in time range)    ED Course/ Medical Decision Making/ A&P Clinical Course as of 09/30/23 2145  Sat Sep 30, 2023  2024 Mild leukocytosis otherwise labs within normal range including negative troponin. Symptoms ongoing for 4 hrs prior to arrival so single troponin is sufficient. [VK]    Clinical Course User Index [VK] Rexford Maus, DO   {   Click here for ABCD2, HEART and other calculatorsREFRESH Note before signing :1}                              Medical Decision Making This patient presents to the ED with chief complaint(s) of chest pain with pertinent past  medical history of a fib, CAD, HTN, CHF, COPD which further complicates the presenting complaint. The complaint involves an extensive differential diagnosis and also carries with it a high risk of complications and morbidity.    The differential diagnosis includes ACS, arrhythmia, anemia, pneumonia,  pneumothorax, pulmonary edema, pleural effusion, no wheezing on exam making COPD exacerbation unlikely  Additional history obtained: Additional history obtained from EMS  Records reviewed outpatient cardiology records  ED Course and Reassessment: On patient's arrival he was hemodynamically stable in no acute distress.  EKG on arrival showed normal sinus rhythm without acute ischemic changes.  Patient will have labs including troponin and chest x-ray performed.  He was given pain control and will be closely reassessed.  Independent labs interpretation:  The following labs were independently interpreted: within normal range  Independent visualization of imaging: - I independently visualized the following imaging with scope of interpretation limited to determining acute life threatening conditions related to emergency care: CXR, which revealed no acute disease  Consultation: - Consulted or discussed management/test interpretation w/ external professional: N/A  Consideration for admission or further workup: Patient has no emergent conditions requiring admission or further work-up at this time and is stable for discharge home with primary care follow-up  Social Determinants of health: N/A    Amount and/or Complexity of Data Reviewed Labs: ordered. Radiology: ordered.  Risk Prescription drug management.   ***  {Document critical care time when appropriate:1} {Document review of labs and clinical decision tools ie heart score, Chads2Vasc2 etc:1}  {Document your independent review of radiology images, and any outside records:1} {Document your discussion with family members, caretakers, and with consultants:1} {Document social determinants of health affecting pt's care:1} {Document your decision making why or why not admission, treatments were needed:1} Final Clinical Impression(s) / ED Diagnoses Final diagnoses:  None    Rx / DC Orders ED Discharge Orders     None

## 2023-09-30 NOTE — Discharge Instructions (Signed)
 You were seen in the emergency department for your chest pain.  You were in atrial fibrillation with EMS but were in a normal heart rhythm by the time you arrived in the ER.  Your workup here appeared normal with no signs of heart attack or stress on your heart and your electrolytes were normal.  You can follow-up with your cardiologist in the next few days to have your symptoms rechecked and see if you may be going in and out of A-fib and if you would need any medication changes.  You should return to the emergency department if you having significantly worsening chest pain, severe shortness of breath, your heart rate is significantly elevated and does not go back to normal on its own or if you have any other new or concerning symptoms.

## 2023-09-30 NOTE — ED Triage Notes (Signed)
 PT BIB [facility] from home when PT started having chest pain that began 4 hours ago, when EMS arrived PT was in Afib and PT endorsed Hx of Afib and feeling similar to previous exacerbations. Given 324mg  aspirin by EMS. PT converted out of Afib en route per EMS., with CP subsiding. PT endorses 2/10 Chest Pain radiating into arm on left side. [facility] vitals HR 90, O2 91% room air, BP 120/78  CBG 100. Aox4.

## 2023-10-02 ENCOUNTER — Other Ambulatory Visit: Payer: Self-pay

## 2023-10-02 ENCOUNTER — Emergency Department (HOSPITAL_COMMUNITY): Payer: No Typology Code available for payment source

## 2023-10-02 ENCOUNTER — Encounter (HOSPITAL_COMMUNITY): Payer: Self-pay | Admitting: Emergency Medicine

## 2023-10-02 ENCOUNTER — Observation Stay (HOSPITAL_COMMUNITY)
Admission: EM | Admit: 2023-10-02 | Discharge: 2023-10-10 | Disposition: A | Discharge status: Home / Self Care | Payer: No Typology Code available for payment source | Attending: Family Medicine | Admitting: Family Medicine

## 2023-10-02 DIAGNOSIS — F419 Anxiety disorder, unspecified: Secondary | ICD-10-CM | POA: Diagnosis not present

## 2023-10-02 DIAGNOSIS — Z8709 Personal history of other diseases of the respiratory system: Secondary | ICD-10-CM

## 2023-10-02 DIAGNOSIS — R531 Weakness: Secondary | ICD-10-CM | POA: Diagnosis not present

## 2023-10-02 DIAGNOSIS — G8929 Other chronic pain: Secondary | ICD-10-CM | POA: Diagnosis not present

## 2023-10-02 DIAGNOSIS — Z794 Long term (current) use of insulin: Secondary | ICD-10-CM | POA: Diagnosis not present

## 2023-10-02 DIAGNOSIS — I480 Paroxysmal atrial fibrillation: Secondary | ICD-10-CM | POA: Diagnosis not present

## 2023-10-02 DIAGNOSIS — E1140 Type 2 diabetes mellitus with diabetic neuropathy, unspecified: Secondary | ICD-10-CM | POA: Diagnosis not present

## 2023-10-02 DIAGNOSIS — R4182 Altered mental status, unspecified: Principal | ICD-10-CM

## 2023-10-02 DIAGNOSIS — Z8673 Personal history of transient ischemic attack (TIA), and cerebral infarction without residual deficits: Secondary | ICD-10-CM | POA: Insufficient documentation

## 2023-10-02 DIAGNOSIS — N400 Enlarged prostate without lower urinary tract symptoms: Secondary | ICD-10-CM | POA: Diagnosis not present

## 2023-10-02 DIAGNOSIS — I10 Essential (primary) hypertension: Secondary | ICD-10-CM | POA: Diagnosis not present

## 2023-10-02 DIAGNOSIS — G9341 Metabolic encephalopathy: Secondary | ICD-10-CM | POA: Diagnosis present

## 2023-10-02 DIAGNOSIS — I350 Nonrheumatic aortic (valve) stenosis: Secondary | ICD-10-CM | POA: Diagnosis not present

## 2023-10-02 DIAGNOSIS — F332 Major depressive disorder, recurrent severe without psychotic features: Secondary | ICD-10-CM | POA: Diagnosis not present

## 2023-10-02 DIAGNOSIS — Z87898 Personal history of other specified conditions: Secondary | ICD-10-CM

## 2023-10-02 DIAGNOSIS — Z8669 Personal history of other diseases of the nervous system and sense organs: Secondary | ICD-10-CM | POA: Insufficient documentation

## 2023-10-02 DIAGNOSIS — Z954 Presence of other heart-valve replacement: Secondary | ICD-10-CM | POA: Insufficient documentation

## 2023-10-02 DIAGNOSIS — S0083XA Contusion of other part of head, initial encounter: Secondary | ICD-10-CM | POA: Diagnosis not present

## 2023-10-02 DIAGNOSIS — I5032 Chronic diastolic (congestive) heart failure: Secondary | ICD-10-CM | POA: Diagnosis not present

## 2023-10-02 DIAGNOSIS — S0990XA Unspecified injury of head, initial encounter: Secondary | ICD-10-CM | POA: Diagnosis not present

## 2023-10-02 DIAGNOSIS — J449 Chronic obstructive pulmonary disease, unspecified: Secondary | ICD-10-CM | POA: Insufficient documentation

## 2023-10-02 DIAGNOSIS — Z87891 Personal history of nicotine dependence: Secondary | ICD-10-CM | POA: Diagnosis not present

## 2023-10-02 DIAGNOSIS — E66813 Obesity, class 3: Secondary | ICD-10-CM | POA: Insufficient documentation

## 2023-10-02 DIAGNOSIS — E11649 Type 2 diabetes mellitus with hypoglycemia without coma: Principal | ICD-10-CM | POA: Insufficient documentation

## 2023-10-02 DIAGNOSIS — Z1152 Encounter for screening for COVID-19: Secondary | ICD-10-CM | POA: Diagnosis not present

## 2023-10-02 DIAGNOSIS — E162 Hypoglycemia, unspecified: Secondary | ICD-10-CM | POA: Diagnosis present

## 2023-10-02 DIAGNOSIS — G8194 Hemiplegia, unspecified affecting left nondominant side: Secondary | ICD-10-CM | POA: Diagnosis not present

## 2023-10-02 DIAGNOSIS — F1090 Alcohol use, unspecified, uncomplicated: Secondary | ICD-10-CM | POA: Insufficient documentation

## 2023-10-02 DIAGNOSIS — I110 Hypertensive heart disease with heart failure: Secondary | ICD-10-CM | POA: Diagnosis not present

## 2023-10-02 DIAGNOSIS — Z79899 Other long term (current) drug therapy: Secondary | ICD-10-CM | POA: Insufficient documentation

## 2023-10-02 DIAGNOSIS — R6510 Systemic inflammatory response syndrome (SIRS) of non-infectious origin without acute organ dysfunction: Secondary | ICD-10-CM | POA: Insufficient documentation

## 2023-10-02 DIAGNOSIS — G9389 Other specified disorders of brain: Secondary | ICD-10-CM | POA: Diagnosis not present

## 2023-10-02 DIAGNOSIS — Z6841 Body Mass Index (BMI) 40.0 and over, adult: Secondary | ICD-10-CM | POA: Insufficient documentation

## 2023-10-02 DIAGNOSIS — E119 Type 2 diabetes mellitus without complications: Secondary | ICD-10-CM

## 2023-10-02 LAB — COMPREHENSIVE METABOLIC PANEL
ALT: 14 U/L (ref 0–44)
AST: 18 U/L (ref 15–41)
Albumin: 3.8 g/dL (ref 3.5–5.0)
Alkaline Phosphatase: 41 U/L (ref 38–126)
Anion gap: 11 (ref 5–15)
BUN: 21 mg/dL (ref 8–23)
CO2: 26 mmol/L (ref 22–32)
Calcium: 9.1 mg/dL (ref 8.9–10.3)
Chloride: 103 mmol/L (ref 98–111)
Creatinine, Ser: 1.08 mg/dL (ref 0.61–1.24)
GFR, Estimated: >60 mL/min (ref >60)
Glucose, Bld: 60 mg/dL — ABNORMAL LOW (ref 70–99)
Potassium: 4.1 mmol/L (ref 3.5–5.1)
Sodium: 140 mmol/L (ref 135–145)
Total Bilirubin: 0.6 mg/dL (ref 0.0–1.2)
Total Protein: 6.4 g/dL — ABNORMAL LOW (ref 6.5–8.1)

## 2023-10-02 LAB — RESP PANEL BY RT-PCR (RSV, FLU A&B, COVID)  RVPGX2
Influenza A by PCR: NEGATIVE
Influenza B by PCR: NEGATIVE
Resp Syncytial Virus by PCR: NEGATIVE
SARS Coronavirus 2 by RT PCR: NEGATIVE

## 2023-10-02 LAB — CBC
HCT: 41.1 % (ref 39.0–52.0)
Hemoglobin: 13.3 g/dL (ref 13.0–17.0)
MCH: 30 pg (ref 26.0–34.0)
MCHC: 32.4 g/dL (ref 30.0–36.0)
MCV: 92.8 fL (ref 80.0–100.0)
Platelets: 269 10*3/uL (ref 150–400)
RBC: 4.43 MIL/uL (ref 4.22–5.81)
RDW: 15.2 % (ref 11.5–15.5)
WBC: 13.3 10*3/uL — ABNORMAL HIGH (ref 4.0–10.5)
nRBC: 0 % (ref 0.0–0.2)

## 2023-10-02 LAB — I-STAT ARTERIAL BLOOD GAS, ED
Acid-Base Excess: 4 mmol/L — ABNORMAL HIGH (ref 0.0–2.0)
Bicarbonate: 30.6 mmol/L — ABNORMAL HIGH (ref 20.0–28.0)
Calcium, Ion: 1.28 mmol/L (ref 1.15–1.40)
HCT: 41 % (ref 39.0–52.0)
Hemoglobin: 13.9 g/dL (ref 13.0–17.0)
O2 Saturation: 86 %
Patient temperature: 97.3
Potassium: 3.9 mmol/L (ref 3.5–5.1)
Sodium: 140 mmol/L (ref 135–145)
TCO2: 32 mmol/L (ref 22–32)
pCO2 arterial: 53.4 mm[Hg] — ABNORMAL HIGH (ref 32–48)
pH, Arterial: 7.364 (ref 7.35–7.45)
pO2, Arterial: 53 mm[Hg] — ABNORMAL LOW (ref 83–108)

## 2023-10-02 LAB — URINALYSIS, ROUTINE W REFLEX MICROSCOPIC
Bilirubin Urine: NEGATIVE
Glucose, UA: NEGATIVE mg/dL
Hgb urine dipstick: NEGATIVE
Ketones, ur: NEGATIVE mg/dL
Nitrite: NEGATIVE
Protein, ur: NEGATIVE mg/dL
Specific Gravity, Urine: 1.006 (ref 1.005–1.030)
pH: 5 (ref 5.0–8.0)

## 2023-10-02 LAB — LACTIC ACID, PLASMA: Lactic Acid, Venous: 1.7 mmol/L (ref 0.5–1.9)

## 2023-10-02 LAB — CBG MONITORING, ED
Glucose-Capillary: 104 mg/dL — ABNORMAL HIGH (ref 70–99)
Glucose-Capillary: 112 mg/dL — ABNORMAL HIGH (ref 70–99)
Glucose-Capillary: 114 mg/dL — ABNORMAL HIGH (ref 70–99)
Glucose-Capillary: 187 mg/dL — ABNORMAL HIGH (ref 70–99)
Glucose-Capillary: 69 mg/dL — ABNORMAL LOW (ref 70–99)

## 2023-10-02 LAB — AMMONIA: Ammonia: 29 umol/L (ref 9–35)

## 2023-10-02 LAB — CK: Total CK: 54 U/L (ref 49–397)

## 2023-10-02 MED ORDER — SODIUM CHLORIDE 0.9 % IV SOLN: 250.0000 mL | INTRAVENOUS | Status: AC | PRN

## 2023-10-02 MED ORDER — SODIUM CHLORIDE 0.9% FLUSH: 3.0000 mL | INTRAVENOUS | Status: DC | PRN

## 2023-10-02 MED ORDER — DEXTROSE 50 % IV SOLN
1.0000 | Freq: Once | INTRAVENOUS | Status: AC
Start: 2023-10-02 — End: 2023-10-02
  Administered 2023-10-02: 50 mL via INTRAVENOUS
  Filled 2023-10-02: qty 50

## 2023-10-02 MED ORDER — ACETAMINOPHEN 500 MG PO TABS
1000.0000 mg | ORAL_TABLET | Freq: Four times a day (QID) | ORAL | Status: DC | PRN
  Administered 2023-10-02 – 2023-10-07 (×5): 1000 mg via ORAL
  Filled 2023-10-02 (×6): qty 2

## 2023-10-02 MED ORDER — FUROSEMIDE 10 MG/ML IJ SOLN
40.0000 mg | Freq: Once | INTRAMUSCULAR | Status: AC
  Administered 2023-10-02: 40 mg via INTRAVENOUS
  Filled 2023-10-02: qty 4

## 2023-10-02 MED ORDER — SODIUM CHLORIDE 0.9% FLUSH
3.0000 mL | Freq: Two times a day (BID) | INTRAVENOUS | Status: DC
  Administered 2023-10-02 – 2023-10-10 (×16): 3 mL via INTRAVENOUS

## 2023-10-02 MED ORDER — IOHEXOL 350 MG/ML SOLN
75.0000 mL | Freq: Once | INTRAVENOUS | Status: AC | PRN
  Administered 2023-10-02: 75 mL via INTRAVENOUS

## 2023-10-02 NOTE — ED Provider Notes (Signed)
  Physical Exam  BP 138/87 (BP Location: Right Arm)   Pulse 95   Temp 97.9 F (36.6 C) (Oral)   Resp 14   Ht 6\' 2"  (1.88 m)   Wt (!) 156.5 kg   SpO2 97%   BMI 44.30 kg/m   Physical Exam  Procedures  Procedures  ED Course / MDM   Clinical Course as of 10/02/23 2346  Mon Oct 02, 2023  1321 Ammonia [DR]    Clinical Course User Index [DR] Margarita Grizzle, MD   Medical Decision Making Amount and/or Complexity of Data Reviewed Labs: ordered. Decision-making details documented in ED Course. Radiology: ordered.  Risk OTC drugs. Prescription drug management. Decision regarding hospitalization.   41J with morbid obesity, afib on AC, DM2, here with afib with RVR on sat, spontaneously converted and Dc'd. Presented altered and confused. Home health sent him the the ER (lives at home with his girfriend). Hx of previous CVA with L sided deficits. No clear etiology of AMS. CT Head pending. Hypoglycemic to 60. Repeat CBG 114.  Patient still remains somnolent and confused, intermittently dozing off.  Hospitalist medicine consulted for admission for observation, patient could benefit from MRI imaging inpatient.  I spoke with Dr. Allena Katz of on-call hospitalist medicine for admission for observation and further monitoring.  She recommended CT chest abdomen pelvis to further evaluate for possible sepsis given the patient's mild hypothermia 95.8 and leukocytosis to 13.3.  She requests repaged after this is performed.  Also adding on lactic acid and CK.  In addition to COVID and influenza PCR testing.  Labs: Leukocytosis of 13.3, urinalysis with trace leukocytes, rare bacteria, unconvincing for UTI, negative nitrites, 0-5 WBCs.  Lactic acid 1.7, CK normal.  COVID, influenza, RSV PCR testing negative.  CT C/A/P:  IMPRESSION:  No acute cardiopulmonary disease.    Mild left colonic diverticulosis, without evidence of  diverticulitis.    Additional ancillary findings as above.    Aortic  Atherosclerosis (ICD10-I70.0) and Emphysema (ICD10-J43.9).   Reengaged hospitalist medicine for admission for observation. No clear source of infection identified on workup.  Repeat blood sugar was with recurrent hypoglycemia at 69.  Patient was offered juice and tolerating oral intake. Recheck after juice 104. Pt with recurrent episodes of hypoglycemia, needs admission for observation.

## 2023-10-02 NOTE — ED Notes (Signed)
 BGL-69. Alex, RN gave patient two cups of apple juice. Patient drinking. Will recheck sugar in 20 min. MD-Lawsing informed

## 2023-10-02 NOTE — ED Provider Notes (Signed)
  EMERGENCY DEPARTMENT AT [facility] Provider Note   CSN: [id number] Arrival date & time: 10/02/23  1131     History  No chief complaint on file.   Lawrence [name] is a 68 y.o. male.  HPI 68 year old male presents today via EMS with report of altered mental status and lethargy.  Per report patient was seen here on Saturday for A-fib.  He is reported to have been cardioverted per notes he had spontaneous conversion prior to any intervention per report he has been declining in strength and mentation since that time.  EMS reports that the patient was not following commands well enough to complete a full assessment.  They report that he is normally alert and oriented at baseline.  He has a history of prior stroke with left-sided weakness with last known well on Saturday.  He has home health aides that come in today they decided that he was not at his baseline and called EMS.  Patient reports that he is coming from home, denies pain or injury.  He is able to answer some of my questions but is slow to respond.  EMS reports that he received Narcan prior to transfer.  They report 93% saturation room air end-tidal CO2 of 44 respiratory rate of 12 CBG of 156 heart rate of 80 and blood pressure of 140/90.    Home Medications Prior to Admission medications   Medication Sig Start Date End Date Taking? Authorizing Provider  acetaminophen (TYLENOL) 650 MG CR tablet Take 1 tablet (650 mg total) by mouth every 8 (eight) hours as needed for pain. 07/10/23   Love, Evlyn Kanner, PA-C  albuterol (VENTOLIN HFA) 108 (90 Base) MCG/ACT inhaler Inhale 2 puffs into the lungs every 6 (six) hours as needed for wheezing or shortness of breath. 04/17/23   Hunsucker, Lesia Sago, MD  allopurinol (ZYLOPRIM) 100 MG tablet Take 1 tablet (100 mg total) by mouth daily. 07/25/23 07/24/24  Elgergawy, Leana Roe, MD  apixaban (ELIQUIS) 5 MG TABS tablet Take 1 tablet (5 mg total) by mouth 2 (two) times daily. 04/17/23    Laurey Morale, MD  ARIPiprazole (ABILIFY) 5 MG tablet TAKE 1 TABLET BY MOUTH EVERYDAY AT BEDTIME 09/07/23   Mozingo, Thereasa Solo, NP  atorvastatin (LIPITOR) 40 MG tablet Take 40 mg by mouth at bedtime. 07/14/23   [provider]  bisacodyl (DULCOLAX) 5 MG EC tablet Take 2 tablets (10 mg total) by mouth daily as needed for moderate constipation. 07/25/23   Elgergawy, Leana Roe, MD  busPIRone (BUSPAR) 10 MG tablet Take 1 tablet (10 mg total) by mouth 3 (three) times daily. 04/17/23   Mozingo, Thereasa Solo, NP  colchicine 0.6 MG tablet Take 1 tablet (0.6 mg total) by mouth daily. 07/12/23   Love, Evlyn Kanner, PA-C  cyanocobalamin (VITAMIN B12) 1000 MCG tablet Take 1,000 mcg by mouth daily.    [provider]  fenofibrate (TRICOR) 145 MG tablet Take 1 tablet (145 mg total) by mouth daily. 04/17/23   Laurey Morale, MD  ferrous sulfate 324 (65 Fe) MG TBEC Take 324 mg by mouth daily with breakfast.     [provider]  fluticasone (FLONASE) 50 MCG/ACT nasal spray Place 2 sprays into both nostrils daily.     [provider]  Fluticasone-Umeclidin-Vilant (TRELEGY ELLIPTA) 200-62.5-25 MCG/ACT AEPB INHALE 1 PUFF BY MOUTH INTO LUNGS ONCE DAILY 08/18/22   Hunsucker, Lesia Sago, MD  icosapent Ethyl (VASCEPA) 1 g capsule Take 2 capsules (2 g  total) by mouth 2 (two) times daily. 04/17/23   Laurey Morale, MD  insulin aspart (NOVOLOG) 100 UNIT/ML injection Inject 0-15 Units into the skin 3 (three) times daily with meals. 0-15 Units, Subcutaneous, 3 times daily with meals, First dose on Sun 07/23/23 at 1700 Correction coverage: Moderate (average weight, post-op) CBG < 70: Implement Hypoglycemia Standing Orders and refer to Hypoglycemia Standing Orders sidebar report CBG 70 - 120: 0 units CBG 121 - 150: 2 units CBG 151 - 200: 3 units CBG 201 - 250: 5 units CBG 251 - 300: 8 units CBG 301 - 350: 11 units CBG 351 - 400: 15 units CBG > 400: call MD and obtain STAT lab verification 07/25/23    Elgergawy, Leana Roe, MD  insulin aspart (NOVOLOG) 100 UNIT/ML injection Inject 0-5 Units into the skin at bedtime. 0-5 Units, Subcutaneous, Daily at bedtime, First dose on Sun 07/23/23 at 2200 Correction coverage: HS scale CBG < 70: Implement Hypoglycemia Standing Orders and refer to Hypoglycemia Standing Orders sidebar report CBG 70 - 120: 0 units CBG 121 - 150: 0 units CBG 151 - 200: 0 units CBG 201 - 250: 2 units CBG 251 - 300: 3 units CBG 301 - 350: 4 units CBG 351 - 400: 5 units 07/25/23   Elgergawy, Leana Roe, MD  insulin degludec (TRESIBA FLEXTOUCH) 100 UNIT/ML FlexTouch Pen Inject 25 Units into the skin daily. 07/25/23   Elgergawy, Leana Roe, MD  Lacosamide 150 MG TABS Take 1 tablet (150 mg total) by mouth 2 (two) times daily. 05/04/23   Windell Norfolk, MD  levETIRAcetam (KEPPRA) 500 MG tablet Take 1 tablet (500 mg total) by mouth 2 (two) times daily. 05/04/23   Windell Norfolk, MD  Menthol-Methyl Salicylate (MUSCLE RUB) 10-15 % CREA Apply 1 Application topically as needed for muscle pain (to knees for pain). 07/10/23   Love, Evlyn Kanner, PA-C  methocarbamol (ROBAXIN) 500 MG tablet Take 1 tablet (500 mg total) by mouth every 8 (eight) hours as needed for muscle spasms. 07/25/23   Elgergawy, Leana Roe, MD  metoprolol succinate (TOPROL-XL) 50 MG 24 hr tablet Take 50 mg by mouth daily. 07/14/23   [provider]  mirtazapine (REMERON) 15 MG tablet Take 15 mg by mouth at bedtime. 07/14/23   [provider]  omeprazole (PRILOSEC) 20 MG capsule Take 20 mg by mouth every morning. 05/29/20   [provider]  polyethylene glycol powder (GLYCOLAX/MIRALAX) 17 GM/SCOOP powder Mix in water as directed and take 1 capful (17 g) by mouth daily. 07/12/23   Love, Evlyn Kanner, PA-C  potassium chloride SA (KLOR-CON M20) 20 MEQ tablet Take 2 tablets (40 mEq total) by mouth daily. 07/25/23   Elgergawy, Leana Roe, MD  pregabalin (LYRICA) 75 MG capsule Take 1 capsule (75 mg total) by mouth 2 (two) times daily.  07/25/23   Elgergawy, Leana Roe, MD  rOPINIRole (REQUIP) 0.25 MG tablet Take 1 tablet (0.25 mg total) by mouth 2 (two) times daily. 07/25/23   Elgergawy, Leana Roe, MD  rosuvastatin (CRESTOR) 40 MG tablet Take 1 tablet (40 mg total) by mouth daily. Patient taking differently: Take 40 mg by mouth at bedtime. 07/12/23   Love, Evlyn Kanner, PA-C  senna-docusate (SENOKOT-S) 8.6-50 MG tablet Take 2 tablets by mouth 2 (two) times daily. 07/25/23   Elgergawy, Leana Roe, MD  tirzepatide [facility]) 15 MG/0.5ML Pen INJECT 15 MG INTO THE SKIN ONCE A WEEK. Patient taking differently: 15 mg once a week. Inject on Monday 06/07/23  Altamese , MD  torsemide (DEMADEX) 20 MG tablet Take 3 tablets (60 mg total) by mouth 2 (two) times daily. 07/25/23   Elgergawy, Leana Roe, MD  venlafaxine XR (EFFEXOR-XR) 75 MG 24 hr capsule Take 2 capsules (150 mg total) by mouth daily with breakfast. 07/25/23   Elgergawy, Leana Roe, MD      Allergies    Vancomycin; Niacin and related; Tubersol [tuberculin, ppd]; and Doxycycline    Review of Systems   Review of Systems  Physical Exam Updated Vital Signs BP 139/87   Pulse 84   Temp (!) 97.4 F (36.3 C) (Oral)   Resp 14   SpO2 98%  Physical Exam Vitals and nursing note reviewed.  Constitutional:      General: He is not in acute distress.    Appearance: He is obese. He is ill-appearing.  HENT:     Head: Normocephalic.     Right Ear: External ear normal.     Left Ear: External ear normal.     Nose: Nose normal.     Mouth/Throat:     Pharynx: Oropharynx is clear.  Eyes:     Pupils: Pupils are equal, round, and reactive to light.  Cardiovascular:     Rate and Rhythm: Normal rate.     Pulses: Normal pulses.  Pulmonary:     Effort: Pulmonary effort is normal.     Breath sounds: Normal breath sounds.  Abdominal:     General: Bowel sounds are normal.     Palpations: Abdomen is soft.  Musculoskeletal:        General: Normal range of motion.     Cervical back: Normal  range of motion.  Skin:    General: Skin is warm.     Capillary Refill: Capillary refill takes less than 2 seconds.  Neurological:     General: No focal deficit present.  Psychiatric:        Mood and Affect: Mood normal.     ED Results / Procedures / Treatments   Labs (all labs ordered are listed, but only abnormal results are displayed) Labs Reviewed  CBC - Abnormal; Notable for the following components:      Result Value   WBC 13.3 (*)    All other components within normal limits  COMPREHENSIVE METABOLIC PANEL - Abnormal; Notable for the following components:   Glucose, Bld 60 (*)    Total Protein 6.4 (*)    All other components within normal limits  URINALYSIS, ROUTINE W REFLEX MICROSCOPIC - Abnormal; Notable for the following components:   Color, Urine STRAW (*)    Leukocytes,Ua TRACE (*)    Bacteria, UA RARE (*)    All other components within normal limits  I-STAT ARTERIAL BLOOD GAS, ED - Abnormal; Notable for the following components:   pCO2 arterial 53.4 (*)    pO2, Arterial 53 (*)    Bicarbonate 30.6 (*)    Acid-Base Excess 4.0 (*)    All other components within normal limits  AMMONIA  CBG MONITORING, ED    EKG None  Radiology DG Chest Port 1 View Result Date: 10/02/2023 CLINICAL DATA:  Altered mental status. EXAM: PORTABLE CHEST 1 VIEW COMPARISON:  09/30/2023. FINDINGS: Low lung volume. There is diffuse moderate vascular congestion, which is accentuated by low lung volume. Bilateral lung fields are otherwise clear. Bilateral costophrenic angles are clear. Stable cardio-mediastinal silhouette. No acute osseous abnormalities. The soft tissues are within normal limits. IMPRESSION: *Moderate diffuse pulmonary vascular congestion, accentuated by low lung  volume. Electronically Signed   By: Jules Schick M.D.   On: 10/02/2023 14:21   DG Chest 2 View Result Date: 09/30/2023 CLINICAL DATA:  Chest pain EXAM: CHEST - 2 VIEW COMPARISON:  07/21/2023 FINDINGS: Lungs are  clear.  No pleural effusion or pneumothorax. The heart is top normal in size. Degenerative changes of the visualized thoracolumbar spine. IMPRESSION: Normal chest radiographs. Electronically Signed   By: Charline Bills M.D.   On: 09/30/2023 19:39    Procedures .Critical Care  Performed by: Margarita Grizzle, MD Authorized by: Margarita Grizzle, MD   Critical care provider statement:    Critical care time (minutes):  30   Critical care was time spent personally by me on the following activities:  Development of treatment plan with patient or surrogate, discussions with consultants, evaluation of patient's response to treatment, examination of patient, ordering and review of laboratory studies, ordering and review of radiographic studies, ordering and performing treatments and interventions, pulse oximetry, re-evaluation of patient's condition and review of old charts     Medications Ordered in ED Medications  furosemide (LASIX) injection 40 mg (has no administration in time range)  dextrose 50 % solution 50 mL (has no administration in time range)    ED Course/ Medical Decision Making/ A&P Clinical Course as of 10/02/23 1558  Mon Oct 02, 2023  1321 Ammonia [DR]    Clinical Course User Index [DR] Margarita Grizzle, MD                                 Medical Decision Making Amount and/or Complexity of Data Reviewed Labs: ordered. Decision-making details documented in ED Course. Radiology: ordered.  Risk Prescription drug management.   68 year old male presents today with altered mental status. Differential diagnosis includes but is not limited to infection, acute electrolyte abnormality, hypoglycemia, intracranial abnormality, hypoxemic respiratory failure, hypercarbic respiratory failure Patient evaluated here in the ED with EGD PE H7.36 pCO2 53 pO2 53 CBC with leukocytosis 13,300 otherwise within normal limits Complete metabolic panel with mild hyperglycemia glucose of 60 otherwise  within normal limits Urine clear Ammonia is collected but not in process at this time awaiting head CT results         Final Clinical Impression(s) / ED Diagnoses Final diagnoses:  Altered mental status, unspecified altered mental status type    Rx / DC Orders ED Discharge Orders     None         Margarita Grizzle, MD 10/02/23 [id number]

## 2023-10-02 NOTE — ED Notes (Signed)
 [name] (Significant other) called asking for a update. Her number is [phone].

## 2023-10-02 NOTE — ED Notes (Signed)
 Patient transported to CT

## 2023-10-02 NOTE — ED Notes (Signed)
 Trinna Post, RN updated significant other Alona Bene regarding patient status.

## 2023-10-02 NOTE — ED Triage Notes (Signed)
 PT BIB [facility] from Home for AMS/lethargy.   Pt was at hospital Saturday for afib.  DC Sat.  Has been declining in strength and mentation since.  EMS states that pt has not followed commands well enough to complete a full assessment.   Pt is A&O at baseline.  Hx of stroke with left side weakness.  LKW Sat.  Home health came by today and agreed pt is not at baseline.  PT does not complain of pain. VSS.  1 mg Narcan given with no change.  93% RA, ETC02 44 RR 12  CBG 156, HR 80, 140/90

## 2023-10-03 ENCOUNTER — Encounter (HOSPITAL_COMMUNITY): Payer: Self-pay | Admitting: Internal Medicine

## 2023-10-03 ENCOUNTER — Other Ambulatory Visit: Payer: Self-pay | Admitting: Adult Health

## 2023-10-03 DIAGNOSIS — I5032 Chronic diastolic (congestive) heart failure: Secondary | ICD-10-CM

## 2023-10-03 DIAGNOSIS — G9341 Metabolic encephalopathy: Secondary | ICD-10-CM

## 2023-10-03 DIAGNOSIS — F331 Major depressive disorder, recurrent, moderate: Secondary | ICD-10-CM

## 2023-10-03 DIAGNOSIS — E119 Type 2 diabetes mellitus without complications: Secondary | ICD-10-CM

## 2023-10-03 DIAGNOSIS — Z8709 Personal history of other diseases of the respiratory system: Secondary | ICD-10-CM

## 2023-10-03 DIAGNOSIS — E162 Hypoglycemia, unspecified: Secondary | ICD-10-CM | POA: Diagnosis not present

## 2023-10-03 DIAGNOSIS — R6510 Systemic inflammatory response syndrome (SIRS) of non-infectious origin without acute organ dysfunction: Secondary | ICD-10-CM | POA: Diagnosis not present

## 2023-10-03 DIAGNOSIS — Z87898 Personal history of other specified conditions: Secondary | ICD-10-CM

## 2023-10-03 DIAGNOSIS — Z794 Long term (current) use of insulin: Secondary | ICD-10-CM

## 2023-10-03 DIAGNOSIS — F411 Generalized anxiety disorder: Secondary | ICD-10-CM

## 2023-10-03 LAB — COMPREHENSIVE METABOLIC PANEL
ALT: 13 U/L (ref 0–44)
AST: 19 U/L (ref 15–41)
Albumin: 3.4 g/dL — ABNORMAL LOW (ref 3.5–5.0)
Alkaline Phosphatase: 35 U/L — ABNORMAL LOW (ref 38–126)
Anion gap: 6 (ref 5–15)
BUN: 17 mg/dL (ref 8–23)
CO2: 27 mmol/L (ref 22–32)
Calcium: 8.2 mg/dL — ABNORMAL LOW (ref 8.9–10.3)
Chloride: 104 mmol/L (ref 98–111)
Creatinine, Ser: 1.16 mg/dL (ref 0.61–1.24)
GFR, Estimated: >60 mL/min (ref >60)
Glucose, Bld: 103 mg/dL — ABNORMAL HIGH (ref 70–99)
Potassium: 4 mmol/L (ref 3.5–5.1)
Sodium: 137 mmol/L (ref 135–145)
Total Bilirubin: 0.6 mg/dL (ref 0.0–1.2)
Total Protein: 6.2 g/dL — ABNORMAL LOW (ref 6.5–8.1)

## 2023-10-03 LAB — CBC WITH DIFFERENTIAL/PLATELET
Abs Immature Granulocytes: 0.08 10*3/uL — ABNORMAL HIGH (ref 0.00–0.07)
Basophils Absolute: 0 10*3/uL (ref 0.0–0.1)
Basophils Relative: 0 %
Eosinophils Absolute: 0.4 10*3/uL (ref 0.0–0.5)
Eosinophils Relative: 4 %
HCT: 41.8 % (ref 39.0–52.0)
Hemoglobin: 13.8 g/dL (ref 13.0–17.0)
Immature Granulocytes: 1 %
Lymphocytes Relative: 16 %
Lymphs Abs: 1.7 10*3/uL (ref 0.7–4.0)
MCH: 29.9 pg (ref 26.0–34.0)
MCHC: 33 g/dL (ref 30.0–36.0)
MCV: 90.7 fL (ref 80.0–100.0)
Monocytes Absolute: 0.8 10*3/uL (ref 0.1–1.0)
Monocytes Relative: 8 %
Neutro Abs: 7.4 10*3/uL (ref 1.7–7.7)
Neutrophils Relative %: 71 %
Platelets: 266 10*3/uL (ref 150–400)
RBC: 4.61 MIL/uL (ref 4.22–5.81)
RDW: 15.1 % (ref 11.5–15.5)
WBC: 10.4 10*3/uL (ref 4.0–10.5)
nRBC: 0 % (ref 0.0–0.2)

## 2023-10-03 LAB — BLOOD GAS, VENOUS
Acid-Base Excess: 8.1 mmol/L — ABNORMAL HIGH (ref 0.0–2.0)
Bicarbonate: 35.1 mmol/L — ABNORMAL HIGH (ref 20.0–28.0)
O2 Saturation: 72.7 %
Patient temperature: 36.5
pCO2, Ven: 57 mm[Hg] (ref 44–60)
pH, Ven: 7.4 (ref 7.25–7.43)
pO2, Ven: 41 mm[Hg] (ref 32–45)

## 2023-10-03 LAB — CBG MONITORING, ED
Glucose-Capillary: 90 mg/dL (ref 70–99)
Glucose-Capillary: 83 mg/dL (ref 70–99)
Glucose-Capillary: 128 mg/dL — ABNORMAL HIGH (ref 70–99)
Glucose-Capillary: 86 mg/dL (ref 70–99)
Glucose-Capillary: 123 mg/dL — ABNORMAL HIGH (ref 70–99)
Glucose-Capillary: 105 mg/dL — ABNORMAL HIGH (ref 70–99)

## 2023-10-03 LAB — MAGNESIUM: Magnesium: 2 mg/dL (ref 1.7–2.4)

## 2023-10-03 LAB — BRAIN NATRIURETIC PEPTIDE: B Natriuretic Peptide: 33.5 pg/mL (ref 0.0–100.0)

## 2023-10-03 LAB — GLUCOSE, CAPILLARY
Glucose-Capillary: 94 mg/dL (ref 70–99)
Glucose-Capillary: 118 mg/dL — ABNORMAL HIGH (ref 70–99)

## 2023-10-03 LAB — PROCALCITONIN: Procalcitonin: <0.1 ng/mL

## 2023-10-03 MED ORDER — HYDROCODONE-ACETAMINOPHEN 5-325 MG PO TABS
1.0000 | ORAL_TABLET | ORAL | Status: DC | PRN
  Administered 2023-10-03 – 2023-10-10 (×23): 1 via ORAL
  Filled 2023-10-03 (×25): qty 1

## 2023-10-03 MED ORDER — VITAMIN B-12 1000 MCG PO TABS
1000.0000 ug | ORAL_TABLET | Freq: Every day | ORAL | Status: DC
  Administered 2023-10-03 – 2023-10-10 (×8): 1000 ug via ORAL
  Filled 2023-10-03 (×8): qty 1

## 2023-10-03 MED ORDER — PREGABALIN 75 MG PO CAPS
150.0000 mg | ORAL_CAPSULE | Freq: Three times a day (TID) | ORAL | Status: DC
  Administered 2023-10-03 – 2023-10-10 (×22): 150 mg via ORAL
  Filled 2023-10-03 (×21): qty 2

## 2023-10-03 MED ORDER — FERROUS SULFATE 325 (65 FE) MG PO TABS
324.0000 mg | ORAL_TABLET | Freq: Every day | ORAL | Status: DC
  Administered 2023-10-04 – 2023-10-10 (×7): 324 mg via ORAL
  Filled 2023-10-03 (×7): qty 1

## 2023-10-03 MED ORDER — APIXABAN 5 MG PO TABS
5.0000 mg | ORAL_TABLET | Freq: Two times a day (BID) | ORAL | Status: DC
  Administered 2023-10-03 – 2023-10-10 (×15): 5 mg via ORAL
  Filled 2023-10-03 (×15): qty 1

## 2023-10-03 MED ORDER — ARIPIPRAZOLE 5 MG PO TABS
5.0000 mg | ORAL_TABLET | Freq: Every day | ORAL | Status: DC
  Administered 2023-10-03 – 2023-10-09 (×7): 5 mg via ORAL
  Filled 2023-10-03 (×7): qty 1

## 2023-10-03 MED ORDER — FLUTICASONE FUROATE-VILANTEROL 200-25 MCG/ACT IN AEPB
1.0000 | INHALATION_SPRAY | Freq: Every day | RESPIRATORY_TRACT | Status: DC
  Administered 2023-10-03 – 2023-10-10 (×8): 1 via RESPIRATORY_TRACT
  Filled 2023-10-03: qty 28

## 2023-10-03 MED ORDER — INSULIN ASPART 100 UNIT/ML IJ SOLN
0.0000 [IU] | Freq: Three times a day (TID) | INTRAMUSCULAR | Status: DC
  Administered 2023-10-04 – 2023-10-05 (×2): 1 [IU] via SUBCUTANEOUS
  Administered 2023-10-06: 2 [IU] via SUBCUTANEOUS

## 2023-10-03 MED ORDER — POLYETHYLENE GLYCOL 3350 17 G PO PACK
17.0000 g | PACK | Freq: Every day | ORAL | Status: DC
  Administered 2023-10-03 – 2023-10-09 (×5): 17 g via ORAL
  Filled 2023-10-03 (×7): qty 1

## 2023-10-03 MED ORDER — POLYETHYLENE GLYCOL 3350 17 GM/SCOOP PO POWD
17.0000 g | Freq: Every day | ORAL | Status: DC
  Filled 2023-10-03: qty 119

## 2023-10-03 MED ORDER — DEXTROSE 50 % IV SOLN
1.0000 | INTRAVENOUS | Status: DC | PRN
Start: 2023-10-03 — End: 2023-10-10

## 2023-10-03 MED ORDER — ATORVASTATIN CALCIUM 40 MG PO TABS
40.0000 mg | ORAL_TABLET | Freq: Every day | ORAL | Status: DC
  Administered 2023-10-03 – 2023-10-09 (×7): 40 mg via ORAL
  Filled 2023-10-03 (×7): qty 1

## 2023-10-03 MED ORDER — TORSEMIDE 20 MG PO TABS
60.0000 mg | ORAL_TABLET | Freq: Two times a day (BID) | ORAL | Status: DC
  Administered 2023-10-03 – 2023-10-10 (×16): 60 mg via ORAL
  Filled 2023-10-03 (×16): qty 3

## 2023-10-03 MED ORDER — FENTANYL CITRATE PF 50 MCG/ML IJ SOSY
50.0000 ug | PREFILLED_SYRINGE | INTRAMUSCULAR | Status: DC | PRN
  Administered 2023-10-03 (×2): 50 ug via INTRAVENOUS
  Filled 2023-10-03 (×2): qty 1

## 2023-10-03 MED ORDER — UMECLIDINIUM BROMIDE 62.5 MCG/ACT IN AEPB
1.0000 | INHALATION_SPRAY | Freq: Every day | RESPIRATORY_TRACT | Status: DC
  Administered 2023-10-03 – 2023-10-10 (×8): 1 via RESPIRATORY_TRACT
  Filled 2023-10-03 (×2): qty 7

## 2023-10-03 MED ORDER — LACOSAMIDE 50 MG PO TABS
150.0000 mg | ORAL_TABLET | Freq: Two times a day (BID) | ORAL | Status: DC
  Administered 2023-10-03 – 2023-10-10 (×14): 150 mg via ORAL
  Filled 2023-10-03 (×14): qty 3

## 2023-10-03 MED ORDER — LEVETIRACETAM 500 MG PO TABS
500.0000 mg | ORAL_TABLET | Freq: Two times a day (BID) | ORAL | Status: DC
Start: 2023-10-03 — End: 2023-10-10
  Administered 2023-10-03 – 2023-10-10 (×15): 500 mg via ORAL
  Filled 2023-10-03 (×15): qty 1

## 2023-10-03 MED ORDER — MIRTAZAPINE 15 MG PO TABS
15.0000 mg | ORAL_TABLET | Freq: Every day | ORAL | Status: DC
  Administered 2023-10-03 – 2023-10-09 (×7): 15 mg via ORAL
  Filled 2023-10-03 (×7): qty 1

## 2023-10-03 MED ORDER — MELATONIN 3 MG PO TABS
3.0000 mg | ORAL_TABLET | Freq: Every evening | ORAL | Status: DC | PRN
  Administered 2023-10-05 – 2023-10-08 (×4): 3 mg via ORAL
  Filled 2023-10-03 (×4): qty 1

## 2023-10-03 MED ORDER — HYDROMORPHONE HCL 1 MG/ML IJ SOLN
0.5000 mg | Freq: Once | INTRAMUSCULAR | Status: AC
  Administered 2023-10-03: 0.5 mg via INTRAVENOUS
  Filled 2023-10-03: qty 0.5

## 2023-10-03 MED ORDER — ALBUTEROL SULFATE (2.5 MG/3ML) 0.083% IN NEBU: 2.5000 mg | INHALATION_SOLUTION | RESPIRATORY_TRACT | Status: DC | PRN

## 2023-10-03 MED ORDER — NALOXONE HCL 0.4 MG/ML IJ SOLN: 0.4000 mg | INTRAMUSCULAR | Status: DC | PRN

## 2023-10-03 MED ORDER — ONDANSETRON HCL 4 MG/2ML IJ SOLN: 4.0000 mg | Freq: Four times a day (QID) | INTRAMUSCULAR | Status: DC | PRN

## 2023-10-03 NOTE — Progress Notes (Signed)
 PROGRESS NOTE    Lawrence [name]  ZOX:[id number] DOB: January 23, 1956 DOA: 10/02/2023 PCP: [name], PA-C  Outpatient Specialists:     Brief Narrative:  Patient is a 68 year old male, morbidly obese, with past medical history significant for type 2 diabetes mellitus, chronic diastolic CHF, COPD, severe aortic stenosis s/p TAVR in 2024 and seizure disorder.  Patient was admitted with recurrent hypoglycemia.  Patient may have recently increased basal insulin (Tresiba from 25 units every morning to 30 units every morning).  Blood sugar is stabilizing.  Patient reports peripheral neuropathy (involving lower legs).  Long-acting insulin is currently on hold.  Patient is on sensitive sliding scale insulin protocol.  10/03/2023: Patient was admitted earlier today.   Assessment & Plan:   Principal Problem:   Hypoglycemia Active Problems:   Chronic diastolic CHF (congestive heart failure) (HCC)   DM2 (diabetes mellitus, type 2) (HCC)   Acute metabolic encephalopathy   SIRS (systemic inflammatory response syndrome) (HCC)   History of COPD   History of seizures   Recurrent hypoglycemia: -Continue to monitor blood sugar closely. -Sensitive sliding scale insulin coverage. -Long-acting insulin is on hold. -Check A1c. -Further management will depend on hospital course.  Diabetic neuropathy: -Resume home dose Lyrica.   DVT prophylaxis: Apixaban. Code Status: DO NOT RESUSCITATE. Family Communication:  Disposition Plan: Observation.   Consultants:  None.  Procedures:  None.  Antimicrobials:  None.   Subjective: -Reports paresthesias of lower legs.  Objective: Vitals:   10/03/23 0345 10/03/23 0400 10/03/23 0500 10/03/23 0549  BP:  112/72 131/78   Pulse: 87 86 86 83  Resp: 11 13 12 13   Temp:    97.6 F (36.4 C)  TempSrc:    Oral  SpO2: 93% 93% 97% 96%  Weight:      Height:        Intake/Output Summary (Last 24 hours) at 10/03/2023 1021 Last data filed at  10/03/2023 0800 Gross per 24 hour  Intake --  Output 1800 ml  Net -1800 ml   Filed Weights   10/02/23 2043  Weight: (!) 156.5 kg    Examination:  General exam: Appears calm and comfortable.  Patient is morbidly obese. Respiratory system: Clear to auscultation.  Cardiovascular system: S1 & S2 heard Gastrointestinal system: Abdomen is morbidly obese, soft and nontender.  Central nervous system: Awake and alert.  Patient moves all extremities.   Extremities: Fullness of the ankle.  Data Reviewed: I have personally reviewed following labs and imaging studies  CBC: Recent Labs  Lab 09/30/23 1839 10/02/23 1238 10/02/23 1312 10/03/23 0259  WBC 11.5*  --  13.3* 10.4  NEUTROABS 8.2*  --   --  7.4  HGB 13.9 13.9 13.3 13.8  HCT 43.0 41.0 41.1 41.8  MCV 92.5  --  92.8 90.7  PLT 267  --  269 266   Basic Metabolic Panel: Recent Labs  Lab 09/30/23 1839 10/02/23 1238 10/02/23 1312 10/03/23 0259  NA 142 140 140 137  K 4.1 3.9 4.1 4.0  CL 103  --  103 104  CO2 27  --  26 27  GLUCOSE 92  --  60* 103*  BUN 24*  --  21 17  CREATININE 1.22  --  1.08 1.16  CALCIUM 9.8  --  9.1 8.2*  MG 2.1  --   --  2.0   GFR: Estimated Creatinine Clearance: 99.1 mL/min (by C-G formula based on SCr of 1.16 mg/dL). Liver Function Tests: Recent Labs  Lab 10/02/23 1312 10/03/23  0259  AST 18 19  ALT 14 13  ALKPHOS 41 35*  BILITOT 0.6 0.6  PROT 6.4* 6.2*  ALBUMIN 3.8 3.4*   No results for input(s): "LIPASE", "AMYLASE" in the last 168 hours. Recent Labs  Lab 10/02/23 1543  AMMONIA 29   Coagulation Profile: No results for input(s): "INR", "PROTIME" in the last 168 hours. Cardiac Enzymes: Recent Labs  Lab 10/02/23 1850  CKTOTAL 54   BNP (last 3 results) No results for input(s): "PROBNP" in the last 8760 hours. HbA1C: No results for input(s): "HGBA1C" in the last 72 hours. CBG: Recent Labs  Lab 10/03/23 0211 10/03/23 0246 10/03/23 0333 10/03/23 0550 10/03/23 0754  GLUCAP 90  83 128* 86 123*   Lipid Profile: No results for input(s): "CHOL", "HDL", "LDLCALC", "TRIG", "CHOLHDL", "LDLDIRECT" in the last 72 hours. Thyroid Function Tests: No results for input(s): "TSH", "T4TOTAL", "FREET4", "T3FREE", "THYROIDAB" in the last 72 hours. Anemia Panel: No results for input(s): "VITAMINB12", "FOLATE", "FERRITIN", "TIBC", "IRON", "RETICCTPCT" in the last 72 hours. Urine analysis:    Component Value Date/Time   COLORURINE STRAW (A) 10/02/2023 1312   APPEARANCEUR CLEAR 10/02/2023 1312   LABSPEC 1.006 10/02/2023 1312   PHURINE 5.0 10/02/2023 1312   GLUCOSEU NEGATIVE 10/02/2023 1312   HGBUR NEGATIVE 10/02/2023 1312   BILIRUBINUR NEGATIVE 10/02/2023 1312   KETONESUR NEGATIVE 10/02/2023 1312   PROTEINUR NEGATIVE 10/02/2023 1312   NITRITE NEGATIVE 10/02/2023 1312   LEUKOCYTESUR TRACE (A) 10/02/2023 1312   Sepsis Labs: @LABRCNTIP (procalcitonin:4,lacticidven:4)  ) Recent Results (from the past 240 hours)  Resp panel by RT-PCR (RSV, Flu A&B, Covid) Anterior Nasal Swab     Status: None   Collection Time: 10/02/23  6:50 PM   Specimen: Anterior Nasal Swab  Result Value Ref Range Status   SARS Coronavirus 2 by RT PCR NEGATIVE NEGATIVE Final   Influenza A by PCR NEGATIVE NEGATIVE Final   Influenza B by PCR NEGATIVE NEGATIVE Final    Comment: (NOTE) The Xpert Xpress SARS-CoV-2/FLU/RSV plus assay is intended as an aid in the diagnosis of influenza from Nasopharyngeal swab specimens and should not be used as a sole basis for treatment. Nasal washings and aspirates are unacceptable for Xpert Xpress SARS-CoV-2/FLU/RSV testing.  Fact Sheet for Patients: BloggerCourse.com  Fact Sheet for Healthcare Providers: SeriousBroker.it  This test is not yet approved or cleared by the [place] FDA and has been authorized for detection and/or diagnosis of SARS-CoV-2 by FDA under an Emergency Use Authorization (EUA). This EUA will  remain in effect (meaning this test can be used) for the duration of the COVID-19 declaration under Section 564(b)(1) of the Act, 21 U.S.C. section 360bbb-3(b)(1), unless the authorization is terminated or revoked.     Resp Syncytial Virus by PCR NEGATIVE NEGATIVE Final    Comment: (NOTE) Fact Sheet for Patients: BloggerCourse.com  Fact Sheet for Healthcare Providers: SeriousBroker.it  This test is not yet approved or cleared by the [place] FDA and has been authorized for detection and/or diagnosis of SARS-CoV-2 by FDA under an Emergency Use Authorization (EUA). This EUA will remain in effect (meaning this test can be used) for the duration of the COVID-19 declaration under Section 564(b)(1) of the Act, 21 U.S.C. section 360bbb-3(b)(1), unless the authorization is terminated or revoked.  Performed at [facility], [address] [place], [place] [address]          Radiology Studies: CT CHEST ABDOMEN PELVIS W CONTRAST Result Date: 10/02/2023 CLINICAL DATA:  Sepsis EXAM: CT CHEST,  ABDOMEN, AND PELVIS WITH CONTRAST TECHNIQUE: Multidetector CT imaging of the chest, abdomen and pelvis was performed following the standard protocol during bolus administration of intravenous contrast. RADIATION DOSE REDUCTION: This exam was performed according to the departmental dose-optimization program which includes automated exposure control, adjustment of the mA and/or kV according to patient size and/or use of iterative reconstruction technique. CONTRAST:  75mL OMNIPAQUE IOHEXOL 350 MG/ML SOLN COMPARISON:  CTA chest abdomen pelvis dated 01/27/2023 FINDINGS: CT CHEST FINDINGS Cardiovascular: The heart is normal in size. No pericardial effusion. Status post TAVR. No evidence of thoracic aortic aneurysm. Atherosclerotic calcifications of the aortic arch. Moderate three-vessel coronary atherosclerosis. Mediastinum/Nodes: No suspicious  mediastinal lymphadenopathy. Visualized thyroid is unremarkable. Lungs/Pleura: Mild centrilobular and paraseptal emphysematous changes, upper lung predominant. No suspicious pulmonary nodules. Mild scarring/atelectasis in the bilateral lower lobes. No focal consolidation. No pleural effusion pneumothorax. Musculoskeletal: Degenerative changes of the thoracic spine. CT ABDOMEN PELVIS FINDINGS Hepatobiliary: Mild hepatic steatosis. 4.1 cm inferior right hepatic lobe cyst (series 3/image 60). Gallbladder is unremarkable. No intrahepatic or extrahepatic ductal dilatation. Pancreas: Within normal limits. Spleen: Within normal limits. Adrenals/Urinary Tract: Adrenal glands are within normal limits. Left kidney is within normal limits. Right lower pole renal cysts measuring up to 2.8 cm (series 3/image 30), benign (Bosniak I). No follow-up is recommended. Bladder is normal limits. Stomach/Bowel: Stomach is within normal limits. No evidence of bowel obstruction. Small duodenal diverticulum. Normal appendix (series 3/image 94). Mild left colonic diverticulosis, without evidence of diverticulitis. Mild to moderate left colonic stool burden. Vascular/Lymphatic: No evidence of abdominal aortic aneurysm. Atherosclerotic calcifications of the abdominal aorta and branch vessels, although vessels remain patent. No suspicious abdominopelvic lymphadenopathy. Reproductive: Prostate is unremarkable. Other: No abdominopelvic ascites. Musculoskeletal: Mild degenerative changes of the lumbar spine. Spinal stimulator at T12-L1. IMPRESSION: No acute cardiopulmonary disease. Mild left colonic diverticulosis, without evidence of diverticulitis. Additional ancillary findings as above. Aortic Atherosclerosis (ICD10-I70.0) and Emphysema (ICD10-J43.9). Electronically Signed   By: Charline Bills M.D.   On: 10/02/2023 21:32   CT Head Wo Contrast Result Date: 10/02/2023 CLINICAL DATA:  Altered mental status and lethargy. Recent hospitalization  for atrial fibrillation, declining strength in mentation since. History of stroke with left-sided weakness. EXAM: CT HEAD WITHOUT CONTRAST TECHNIQUE: Contiguous axial images were obtained from the base of the skull through the vertex without intravenous contrast. RADIATION DOSE REDUCTION: This exam was performed according to the departmental dose-optimization program which includes automated exposure control, adjustment of the mA and/or kV according to patient size and/or use of iterative reconstruction technique. COMPARISON:  MRI brain 07/14/2023.  CT head 07/25/2023 FINDINGS: Brain: No acute intracranial hemorrhage. No CT evidence of acute infarct. Nonspecific hypoattenuation in the periventricular and subcortical white matter favored to reflect chronic microvascular ischemic changes. No edema, mass effect, or midline shift. The basilar cisterns are patent. Ventricles: The ventricles are normal. Vascular: No hyperdense vessel or unexpected calcification. Skull: No acute or aggressive finding. Orbits: Orbits are symmetric. Sinuses: Mucosal thickening in the ethmoid sinuses, right greater than left. Additional mucosal thickening in the sphenoid sinuses with small air-fluid level in the left sphenoid sinus. Mucosal thickening in the left maxillary sinus. Other: Mastoid air cells are clear. IMPRESSION: 1. No acute intracranial process. 2. Mild chronic microvascular ischemic changes. 3. Paranasal sinus disease with small air-fluid level in the left sphenoid sinus. Correlate for acute sinusitis. Electronically Signed   By: Emily Filbert M.D.   On: 10/02/2023 16:30   DG Chest Port 1 View Result Date: 10/02/2023 CLINICAL  DATA:  Altered mental status. EXAM: PORTABLE CHEST 1 VIEW COMPARISON:  09/30/2023. FINDINGS: Low lung volume. There is diffuse moderate vascular congestion, which is accentuated by low lung volume. Bilateral lung fields are otherwise clear. Bilateral costophrenic angles are clear. Stable  cardio-mediastinal silhouette. No acute osseous abnormalities. The soft tissues are within normal limits. IMPRESSION: *Moderate diffuse pulmonary vascular congestion, accentuated by low lung volume. Electronically Signed   By: Jules Schick M.D.   On: 10/02/2023 14:21        Scheduled Meds:  apixaban  5 mg Oral BID   atorvastatin  40 mg Oral QHS   fluticasone furoate-vilanterol  1 puff Inhalation Daily   And   umeclidinium bromide  1 puff Inhalation Daily   insulin aspart  0-6 Units Subcutaneous TID WC   levETIRAcetam  500 mg Oral BID   sodium chloride flush  3 mL Intravenous Q12H   torsemide  60 mg Oral BID   Continuous Infusions:  sodium chloride       LOS: 0 days    Time spent: 35 minutes.    Berton Mount, MD  [facility] Pager #: [phone] 7PM-7AM contact night coverage as above

## 2023-10-03 NOTE — H&P (Signed)
 History and Physical      Lawrence [name] ZOX:[id number] DOB: Mar 15, 1956 DOA: 10/02/2023; DOS: 10/03/2023  PCP: [name], PA-C  Patient coming from: home   I have personally briefly reviewed patient's old medical records in [facility]  Chief Complaint: Hypoglycemia  HPI: Lawrence [name] is a 68 y.o. male with medical history significant for type 2 diabetes mellitus, chronic diastolic heart failure, COPD, severe aortic stenosis status post TAVR in July 2024, seizure disorder, who is admitted to [facility] on 10/02/2023 with recurrent hypoglycemia after presenting from home to [facility] ED complaining of low blood sugar.   The patient, who has a history of type 2 diabetes mellitus, conveys that his blood sugar has been running low over the last 1 to 2 days.  He notes that this is in proximity to the recent increase in dose of his basal insulin in the form of Tresiba from his previous dose of 25 units subcu every morning to 30 units SQ every morning.  He is also on sliding scale NovoLog as an outpatient.  Noninvasive polyurea's.  Family has noted that the patient Slightly confused relative to baseline with the low blood sugars, which appears improved with improving blood sugars.  Denies any associated subjective fever, chills, rigors, generalized myalgias.  No recent shortness of breath or cough.    ED Course:  Vital signs in the ED were notable for the following: Temperature max 98, heart rates in the 70s to 90s; systolic pressures in the 1 teens 130s; respiratory rate 14-20, oxygen saturation 97 to 100% on 2 L nasal cannula.  Unclear what his baseline is in terms of self ox requirements.  Labs were notable for the following: CMP notable for glucose of 60, creatinine 1.08.  CBC notable for evidence of count 13,300.  Lactic acid 1.7.  Urinalysis showed no blood cells.  COVID influenza, RSV PCR all negative.  Per my interpretation, EKG in ED demonstrated the following: EKG read  as accelerated junctional rhythm, with heart rate 90, no evidence of T wave or ST changes.  Imaging in the ED, per corresponding formal radiology read, was notable for the following: CT chest abdomen pelvis showed no evidence of acute cardiopulmonary process, Cleen evidence of intracardiac effusion, pneumothorax, CT abdomen/pelvis showed no evidence of acute intra-abdominal or acute process.  Noncontrast CT head showed noes acute intracranial process, clearance of intracranial variance of acute infarct.  While in the ED, the following were administered: Lasix 6 mg IV x 1, amp D50.   \The patient received juice as well as given D50, although blood sugar began to trend down again, associated with mild confusion, ultimately prompting ED to request admission for refractory hypoglycemia, symptomatic.  Subsequently, the patient was admitted for further evaluation and treatment symptomatic refractory hypoglycemia.     Review of Systems: As per HPI otherwise 10 point review of systems negative.   Past Medical History:  Diagnosis Date   Acquired dilation of ascending aorta and aortic root (HCC)    40mm by echo 01/2021   Adenomatous colon polyp 2007   Anemia    Anxiety    Asthma    BPH without obstruction/lower urinary tract symptoms 02/22/2017   Chronic diastolic (congestive) heart failure (HCC)    Chronic venous stasis 03/07/2019   COPD (chronic obstructive pulmonary disease) (HCC)    Coronary artery calcification seen on CAT scan    Depression    Diabetic neuropathy (HCC) 09/11/2019   History of colon polyps  08/24/2018   Hypertension    Morbid obesity (HCC)    OSA (obstructive sleep apnea)    Pain due to onychomycosis of toenails of both feet 09/11/2019   Peripheral neuropathy 02/22/2017   Primary osteoarthritis, left shoulder 03/05/2017   PTSD (post-traumatic stress disorder)    Pure hypercholesterolemia    QT prolongation 03/07/2019   S/P TAVR (transcatheter aortic valve  replacement) 02/07/2023   34mm Evolut FX via TF approach with Dr. Lynnette Caffey and Dr. Laneta Simmers   Seizures [facility])    Severe aortic stenosis    Sinus tachycardia 03/07/2019   Sleep apnea    CPAP   Type 2 diabetes mellitus with vascular disease (HCC) 09/11/2019    Past Surgical History:  Procedure Laterality Date   ENDOVENOUS ABLATION SAPHENOUS VEIN W/ LASER Right 08/20/2020   endovenous laser ablation right greater saphenous vein by Cari Caraway MD    ENDOVENOUS ABLATION SAPHENOUS VEIN W/ LASER Left 11/16/2022   endovenous laser ablation left greater saphenous vein by Cari Caraway MD   INTRAOPERATIVE TRANSTHORACIC ECHOCARDIOGRAM N/A 02/07/2023   Procedure: INTRAOPERATIVE TRANSTHORACIC ECHOCARDIOGRAM;  Surgeon: Orbie Pyo, MD;  Location: [facility] INVASIVE CV LAB;  Service: Open Heart Surgery;  Laterality: N/A;   JOINT REPLACEMENT     left knee replacement x 2   KNEE ARTHROSCOPY Bilateral    LEFT HEART CATH AND CORONARY ANGIOGRAPHY N/A 01/17/2018   Procedure: LEFT HEART CATH AND CORONARY ANGIOGRAPHY;  Surgeon: Marykay Lex, MD;  Location: [facility] INVASIVE CV LAB;  Service: Cardiovascular;  Laterality: N/A;   PRESSURE SENSOR/CARDIOMEMS N/A 08/26/2021   Procedure: PRESSURE SENSOR/CARDIOMEMS;  Surgeon: Laurey Morale, MD;  Location: [facility] INVASIVE CV LAB;  Service: Cardiovascular;  Laterality: N/A;   RIGHT HEART CATH N/A 08/26/2021   Procedure: RIGHT HEART CATH;  Surgeon: Laurey Morale, MD;  Location: [facility] INVASIVE CV LAB;  Service: Cardiovascular;  Laterality: N/A;   RIGHT HEART CATH AND CORONARY ANGIOGRAPHY N/A 01/26/2023   Procedure: RIGHT HEART CATH AND CORONARY ANGIOGRAPHY;  Surgeon: Orbie Pyo, MD;  Location: [facility] INVASIVE CV LAB;  Service: Cardiovascular;  Laterality: N/A;   TEE WITHOUT CARDIOVERSION N/A 08/26/2021   Procedure: TRANSESOPHAGEAL ECHOCARDIOGRAM (TEE);  Surgeon: Laurey Morale, MD;  Location: [facility] ENDOSCOPY;  Service: Cardiovascular;  Laterality: N/A;   TOOTH EXTRACTION N/A  02/03/2023   Procedure: DENTAL RESTORATION/EXTRACTIONS;  Surgeon: [name], DMD;  Location: [facility] OR;  Service: Oral Surgery;  Laterality: N/A;   TRANSCATHETER AORTIC VALVE REPLACEMENT, TRANSFEMORAL Right 02/07/2023   Procedure: Transcatheter Aortic Valve Replacement, Transfemoral;  Surgeon: Orbie Pyo, MD;  Location: [facility] INVASIVE CV LAB;  Service: Open Heart Surgery;  Laterality: Right;   UMBILICAL HERNIA REPAIR      Social History:  reports that he quit smoking about 7 years ago. His smoking use included cigarettes. He started smoking about 51 years ago. He has a 44 pack-year smoking history. He has been exposed to tobacco smoke. He has never used smokeless tobacco. He reports current alcohol use. He reports that he does not use drugs.   Allergies  Allergen Reactions   Vancomycin Other (See Comments)    "Red Man Syndrome" 02/02/17: possible cause for rash under both arms   Niacin And Related Other (See Comments)    Red man syndrome   Tubersol [Tuberculin, Ppd] Other (See Comments)    Reaction unknown   Doxycycline Rash and Other (See Comments)    Family History  Problem Relation Age of Onset   CAD Maternal Grandfather  Diabetes Other    Diabetes Mellitus II Neg Hx    Colon cancer Neg Hx    Esophageal cancer Neg Hx    Inflammatory bowel disease Neg Hx    Liver disease Neg Hx    Pancreatic cancer Neg Hx    Rectal cancer Neg Hx    Stomach cancer Neg Hx    Sleep apnea Neg Hx     Family history reviewed and not pertinent    Prior to Admission medications   Medication Sig Start Date End Date Taking? Authorizing Provider  acetaminophen (TYLENOL) 650 MG CR tablet Take 1 tablet (650 mg total) by mouth every 8 (eight) hours as needed for pain. 07/10/23  Yes Love, Evlyn Kanner, PA-C  albuterol (VENTOLIN HFA) 108 (90 Base) MCG/ACT inhaler Inhale 2 puffs into the lungs every 6 (six) hours as needed for wheezing or shortness of breath. 04/17/23  Yes Hunsucker, Lesia Sago, MD   allopurinol (ZYLOPRIM) 100 MG tablet Take 1 tablet (100 mg total) by mouth daily. 07/25/23 07/24/24 Yes Elgergawy, Leana Roe, MD  apixaban (ELIQUIS) 5 MG TABS tablet Take 1 tablet (5 mg total) by mouth 2 (two) times daily. 04/17/23  Yes Laurey Morale, MD  ARIPiprazole (ABILIFY) 5 MG tablet TAKE 1 TABLET BY MOUTH EVERYDAY AT BEDTIME 09/07/23  Yes Mozingo, Thereasa Solo, NP  atorvastatin (LIPITOR) 40 MG tablet Take 40 mg by mouth at bedtime. 07/14/23  Yes [provider]  bisacodyl (DULCOLAX) 5 MG EC tablet Take 2 tablets (10 mg total) by mouth daily as needed for moderate constipation. 07/25/23  Yes Elgergawy, Leana Roe, MD  busPIRone (BUSPAR) 10 MG tablet Take 1 tablet (10 mg total) by mouth 3 (three) times daily. 04/17/23  Yes Mozingo, Thereasa Solo, NP  colchicine 0.6 MG tablet Take 1 tablet (0.6 mg total) by mouth daily. 07/12/23  Yes Love, Evlyn Kanner, PA-C  cyanocobalamin (VITAMIN B12) 1000 MCG tablet Take 1,000 mcg by mouth daily.   Yes [provider]  fenofibrate (TRICOR) 145 MG tablet Take 1 tablet (145 mg total) by mouth daily. 04/17/23  Yes Laurey Morale, MD  ferrous sulfate 324 (65 Fe) MG TBEC Take 324 mg by mouth daily with breakfast.    Yes [provider]  fluticasone (FLONASE) 50 MCG/ACT nasal spray Place 2 sprays into both nostrils daily.    Yes [provider]  Fluticasone-Umeclidin-Vilant (TRELEGY ELLIPTA) 200-62.5-25 MCG/ACT AEPB INHALE 1 PUFF BY MOUTH INTO LUNGS ONCE DAILY 08/18/22  Yes Hunsucker, Lesia Sago, MD  HUMALOG 100 UNIT/ML injection Inject into the skin as directed. 08/18/23  Yes [provider]  icosapent Ethyl (VASCEPA) 1 g capsule Take 2 capsules (2 g total) by mouth 2 (two) times daily. 04/17/23  Yes Laurey Morale, MD  insulin aspart (NOVOLOG) 100 UNIT/ML injection Inject 0-15 Units into the skin 3 (three) times daily with meals. 0-15 Units, Subcutaneous, 3 times daily with meals, First dose on Sun 07/23/23 at 1700 Correction  coverage: Moderate (average weight, post-op) CBG < 70: Implement Hypoglycemia Standing Orders and refer to Hypoglycemia Standing Orders sidebar report CBG 70 - 120: 0 units CBG 121 - 150: 2 units CBG 151 - 200: 3 units CBG 201 - 250: 5 units CBG 251 - 300: 8 units CBG 301 - 350: 11 units CBG 351 - 400: 15 units CBG > 400: call MD and obtain STAT lab verification 07/25/23  Yes Elgergawy, Leana Roe, MD  insulin aspart (NOVOLOG) 100 UNIT/ML injection Inject 0-5 Units into the  skin at bedtime. 0-5 Units, Subcutaneous, Daily at bedtime, First dose on Sun 07/23/23 at 2200 Correction coverage: HS scale CBG < 70: Implement Hypoglycemia Standing Orders and refer to Hypoglycemia Standing Orders sidebar report CBG 70 - 120: 0 units CBG 121 - 150: 0 units CBG 151 - 200: 0 units CBG 201 - 250: 2 units CBG 251 - 300: 3 units CBG 301 - 350: 4 units CBG 351 - 400: 5 units 07/25/23  Yes Elgergawy, Leana Roe, MD  insulin degludec (TRESIBA FLEXTOUCH) 100 UNIT/ML FlexTouch Pen Inject 25 Units into the skin daily. 07/25/23  Yes Elgergawy, Leana Roe, MD  Lacosamide 150 MG TABS Take 1 tablet (150 mg total) by mouth 2 (two) times daily. 05/04/23  Yes Windell Norfolk, MD  levETIRAcetam (KEPPRA) 500 MG tablet Take 1 tablet (500 mg total) by mouth 2 (two) times daily. 05/04/23  Yes Windell Norfolk, MD  Menthol-Methyl Salicylate (MUSCLE RUB) 10-15 % CREA Apply 1 Application topically as needed for muscle pain (to knees for pain). 07/10/23  Yes Love, Evlyn Kanner, PA-C  metolazone (ZAROXOLYN) 2.5 MG tablet Take 2.5 mg by mouth once a week. 09/20/23  Yes [provider]  metoprolol succinate (TOPROL-XL) 50 MG 24 hr tablet Take 50 mg by mouth daily. 07/14/23  Yes [provider]  mirtazapine (REMERON) 15 MG tablet Take 15 mg by mouth at bedtime. 07/14/23  Yes [provider]  mupirocin ointment (BACTROBAN) 2 % Apply 1 Application topically. 09/20/23  Yes [provider]  omeprazole (PRILOSEC) 20 MG capsule Take 20 mg  by mouth every morning. 05/29/20  Yes [provider]  polyethylene glycol powder (GLYCOLAX/MIRALAX) 17 GM/SCOOP powder Mix in water as directed and take 1 capful (17 g) by mouth daily. 07/12/23  Yes Love, Evlyn Kanner, PA-C  potassium chloride SA (KLOR-CON M20) 20 MEQ tablet Take 2 tablets (40 mEq total) by mouth daily. Patient taking differently: Take 20-40 mEq by mouth See admin instructions. Take 2 tablets in the morning and 1 tablet in the evening 07/25/23  Yes Elgergawy, Leana Roe, MD  prazosin (MINIPRESS) 2 MG capsule Take 2 mg by mouth at bedtime. 08/17/23  Yes [provider]  pregabalin (LYRICA) 150 MG capsule Take 150 mg by mouth 3 (three) times daily. 09/29/23  Yes [provider]  repaglinide (PRANDIN) 2 MG tablet Take 4 mg by mouth 2 (two) times daily. 07/24/23  Yes [provider]  rOPINIRole (REQUIP) 0.25 MG tablet Take 1 tablet (0.25 mg total) by mouth 2 (two) times daily. 07/25/23  Yes Elgergawy, Leana Roe, MD  spironolactone (ALDACTONE) 25 MG tablet Take 12.5 mg by mouth daily. 09/01/23  Yes [provider]  tirzepatide Greggory Keen) 15 MG/0.5ML Pen INJECT 15 MG INTO THE SKIN ONCE A WEEK. Patient taking differently: 15 mg once a week. Inject on Monday 06/07/23  Yes Motwani, Komal, MD  torsemide (DEMADEX) 20 MG tablet Take 3 tablets (60 mg total) by mouth 2 (two) times daily. 07/25/23  Yes Elgergawy, Leana Roe, MD  traMADol (ULTRAM) 50 MG tablet Take 50 mg by mouth every 8 (eight) hours as needed for moderate pain (pain score 4-6). 08/17/23  Yes [provider]  traZODone (DESYREL) 100 MG tablet Take 200 mg by mouth at bedtime. 09/10/23  Yes [provider]  atorvastatin (LIPITOR) 80 MG tablet Take 80 mg by mouth daily. Patient not taking: Reported on 10/02/2023 08/17/23   [provider]  methocarbamol (ROBAXIN) 500 MG tablet Take 1 tablet (500 mg total)  by mouth every 8 (eight) hours as needed for muscle spasms. Patient not  taking: Reported on 10/02/2023 07/25/23   Elgergawy, Leana Roe, MD  senna-docusate (SENOKOT-S) 8.6-50 MG tablet Take 2 tablets by mouth 2 (two) times daily. Patient not taking: Reported on 10/02/2023 07/25/23   Elgergawy, Leana Roe, MD  venlafaxine XR (EFFEXOR-XR) 75 MG 24 hr capsule Take 2 capsules (150 mg total) by mouth daily with breakfast. Patient not taking: Reported on 10/02/2023 07/25/23   Elgergawy, Leana Roe, MD     Objective    Physical Exam: Vitals:   10/02/23 2215 10/02/23 2317 10/02/23 2340 10/03/23 0000  BP: 98/83 138/87  (!) 130/97  Pulse: 96 100 95 97  Resp: 14 15 14 19   Temp:  97.9 F (36.6 C)    TempSrc:  Oral    SpO2: 96% 99% 97% 98%  Weight:      Height:        General: appears to be stated age; alert Skin: warm, dry, no rash Head:  AT/[place] Mouth:  Oral mucosa membranes appear moist, normal dentition Neck: supple; trachea midline Heart:  RRR; did not appreciate any M/R/G Lungs: CTAB, did not appreciate any wheezes, rales, or rhonchi Abdomen: + BS; soft, ND, NT Vascular: 2+ pedal pulses b/l; 2+ radial pulses b/l Extremities: Trace edema bilateral lower extremities, no muscle wasting Neuro: strength and sensation intact in upper and lower extremities b/l    Labs on Admission: I have personally reviewed following labs and imaging studies  CBC: Recent Labs  Lab 09/30/23 1839 10/02/23 1238 10/02/23 1312  WBC 11.5*  --  13.3*  NEUTROABS 8.2*  --   --   HGB 13.9 13.9 13.3  HCT 43.0 41.0 41.1  MCV 92.5  --  92.8  PLT 267  --  269   Basic Metabolic Panel: Recent Labs  Lab 09/30/23 1839 10/02/23 1238 10/02/23 1312  NA 142 140 140  K 4.1 3.9 4.1  CL 103  --  103  CO2 27  --  26  GLUCOSE 92  --  60*  BUN 24*  --  21  CREATININE 1.22  --  1.08  CALCIUM 9.8  --  9.1  MG 2.1  --   --    GFR: Estimated Creatinine Clearance: 106.5 mL/min (by C-G formula based on SCr of 1.08 mg/dL). Liver Function Tests: Recent Labs  Lab 10/02/23 1312  AST 18   ALT 14  ALKPHOS 41  BILITOT 0.6  PROT 6.4*  ALBUMIN 3.8   No results for input(s): "LIPASE", "AMYLASE" in the last 168 hours. Recent Labs  Lab 10/02/23 1543  AMMONIA 29   Coagulation Profile: No results for input(s): "INR", "PROTIME" in the last 168 hours. Cardiac Enzymes: Recent Labs  Lab 10/02/23 1850  CKTOTAL 54   BNP (last 3 results) No results for input(s): "PROBNP" in the last 8760 hours. HbA1C: No results for input(s): "HGBA1C" in the last 72 hours. CBG: Recent Labs  Lab 10/02/23 1711 10/02/23 1848 10/02/23 2248 10/02/23 2310 10/02/23 2356  GLUCAP 114* 112* 69* 104* 187*   Lipid Profile: No results for input(s): "CHOL", "HDL", "LDLCALC", "TRIG", "CHOLHDL", "LDLDIRECT" in the last 72 hours. Thyroid Function Tests: No results for input(s): "TSH", "T4TOTAL", "FREET4", "T3FREE", "THYROIDAB" in the last 72 hours. Anemia Panel: No results for input(s): "VITAMINB12", "FOLATE", "FERRITIN", "TIBC", "IRON", "RETICCTPCT" in the last 72 hours. Urine analysis:    Component Value Date/Time   COLORURINE STRAW (A) 10/02/2023 1312   APPEARANCEUR CLEAR  10/02/2023 1312   LABSPEC 1.006 10/02/2023 1312   PHURINE 5.0 10/02/2023 1312   GLUCOSEU NEGATIVE 10/02/2023 1312   HGBUR NEGATIVE 10/02/2023 1312   BILIRUBINUR NEGATIVE 10/02/2023 1312   KETONESUR NEGATIVE 10/02/2023 1312   PROTEINUR NEGATIVE 10/02/2023 1312   NITRITE NEGATIVE 10/02/2023 1312   LEUKOCYTESUR TRACE (A) 10/02/2023 1312    Radiological Exams on Admission: CT CHEST ABDOMEN PELVIS W CONTRAST Result Date: 10/02/2023 CLINICAL DATA:  Sepsis EXAM: CT CHEST, ABDOMEN, AND PELVIS WITH CONTRAST TECHNIQUE: Multidetector CT imaging of the chest, abdomen and pelvis was performed following the standard protocol during bolus administration of intravenous contrast. RADIATION DOSE REDUCTION: This exam was performed according to the departmental dose-optimization program which includes automated exposure control, adjustment  of the mA and/or kV according to patient size and/or use of iterative reconstruction technique. CONTRAST:  75mL OMNIPAQUE IOHEXOL 350 MG/ML SOLN COMPARISON:  CTA chest abdomen pelvis dated 01/27/2023 FINDINGS: CT CHEST FINDINGS Cardiovascular: The heart is normal in size. No pericardial effusion. Status post TAVR. No evidence of thoracic aortic aneurysm. Atherosclerotic calcifications of the aortic arch. Moderate three-vessel coronary atherosclerosis. Mediastinum/Nodes: No suspicious mediastinal lymphadenopathy. Visualized thyroid is unremarkable. Lungs/Pleura: Mild centrilobular and paraseptal emphysematous changes, upper lung predominant. No suspicious pulmonary nodules. Mild scarring/atelectasis in the bilateral lower lobes. No focal consolidation. No pleural effusion pneumothorax. Musculoskeletal: Degenerative changes of the thoracic spine. CT ABDOMEN PELVIS FINDINGS Hepatobiliary: Mild hepatic steatosis. 4.1 cm inferior right hepatic lobe cyst (series 3/image 60). Gallbladder is unremarkable. No intrahepatic or extrahepatic ductal dilatation. Pancreas: Within normal limits. Spleen: Within normal limits. Adrenals/Urinary Tract: Adrenal glands are within normal limits. Left kidney is within normal limits. Right lower pole renal cysts measuring up to 2.8 cm (series 3/image 30), benign (Bosniak I). No follow-up is recommended. Bladder is normal limits. Stomach/Bowel: Stomach is within normal limits. No evidence of bowel obstruction. Small duodenal diverticulum. Normal appendix (series 3/image 94). Mild left colonic diverticulosis, without evidence of diverticulitis. Mild to moderate left colonic stool burden. Vascular/Lymphatic: No evidence of abdominal aortic aneurysm. Atherosclerotic calcifications of the abdominal aorta and branch vessels, although vessels remain patent. No suspicious abdominopelvic lymphadenopathy. Reproductive: Prostate is unremarkable. Other: No abdominopelvic ascites. Musculoskeletal: Mild  degenerative changes of the lumbar spine. Spinal stimulator at T12-L1. IMPRESSION: No acute cardiopulmonary disease. Mild left colonic diverticulosis, without evidence of diverticulitis. Additional ancillary findings as above. Aortic Atherosclerosis (ICD10-I70.0) and Emphysema (ICD10-J43.9). Electronically Signed   By: Charline Bills M.D.   On: 10/02/2023 21:32   CT Head Wo Contrast Result Date: 10/02/2023 CLINICAL DATA:  Altered mental status and lethargy. Recent hospitalization for atrial fibrillation, declining strength in mentation since. History of stroke with left-sided weakness. EXAM: CT HEAD WITHOUT CONTRAST TECHNIQUE: Contiguous axial images were obtained from the base of the skull through the vertex without intravenous contrast. RADIATION DOSE REDUCTION: This exam was performed according to the departmental dose-optimization program which includes automated exposure control, adjustment of the mA and/or kV according to patient size and/or use of iterative reconstruction technique. COMPARISON:  MRI brain 07/14/2023.  CT head 07/25/2023 FINDINGS: Brain: No acute intracranial hemorrhage. No CT evidence of acute infarct. Nonspecific hypoattenuation in the periventricular and subcortical white matter favored to reflect chronic microvascular ischemic changes. No edema, mass effect, or midline shift. The basilar cisterns are patent. Ventricles: The ventricles are normal. Vascular: No hyperdense vessel or unexpected calcification. Skull: No acute or aggressive finding. Orbits: Orbits are symmetric. Sinuses: Mucosal thickening in the ethmoid sinuses, right greater than left. Additional mucosal thickening  in the sphenoid sinuses with small air-fluid level in the left sphenoid sinus. Mucosal thickening in the left maxillary sinus. Other: Mastoid air cells are clear. IMPRESSION: 1. No acute intracranial process. 2. Mild chronic microvascular ischemic changes. 3. Paranasal sinus disease with small air-fluid level  in the left sphenoid sinus. Correlate for acute sinusitis. Electronically Signed   By: Emily Filbert M.D.   On: 10/02/2023 16:30   DG Chest Port 1 View Result Date: 10/02/2023 CLINICAL DATA:  Altered mental status. EXAM: PORTABLE CHEST 1 VIEW COMPARISON:  09/30/2023. FINDINGS: Low lung volume. There is diffuse moderate vascular congestion, which is accentuated by low lung volume. Bilateral lung fields are otherwise clear. Bilateral costophrenic angles are clear. Stable cardio-mediastinal silhouette. No acute osseous abnormalities. The soft tissues are within normal limits. IMPRESSION: *Moderate diffuse pulmonary vascular congestion, accentuated by low lung volume. Electronically Signed   By: Jules Schick M.D.   On: 10/02/2023 14:21      Assessment/Plan   Principal Problem:   Hypoglycemia Active Problems:   Acute metabolic encephalopathy   Chronic diastolic CHF (congestive heart failure) (HCC)   DM2 (diabetes mellitus, type 2) (HCC)   SIRS (systemic inflammatory response syndrome) (HCC)   History of COPD   History of seizures      #) Recurrent hypoglycemia in the context of insulin-dependent type 2 diabetes mellitus: Patient has noted recurrent hypoglycemia over the last few days following recent increase in basal insulin dose by 5 units, as further quantified above.  In the ED, he is noted to have initial blood sugars in the 60s, with some confusion, with refractory hypoglycemia in spite of juice as well as amp of D50.  Concern for potential contribution towards  hypoglycemia from recent adjustment to outpatient basal insulin.  No evidence to suggest underlying infectious contribution.  Of note, most recent hemoglobin A1c was 6.5% in November 2024.  Plan:    Every 2 hours CBG monitoring x 3 occurrences, followed by qac/hs cbg monitoring starting at 8 AM.  Add on hemoglobin A1c level.  Hold home Evaristo Bury for now.                #) Acute metabolic encephalopathy: Confusion  relative to baseline mental status at times of hypoglycemia, improving with euglycemia.  Suggestive of symptomatic hypoglycemia.  No evidence of additional contributing factors at this time, including no evidence of underlying infectious process.  However, in the context of his history of underlying COPD, also check blood gas to evaluate for any contribution from hypercapnic encephalopathy.   Plan: Further evaluation management of refractory hypoglycemia, as above.  CMP, CBC morning.  Check VBG.                 #) SIRS criteria: Presents with mild leukocytosis as well as mild tachycardia.  However, in the absence of any evidence of underlying infectious process,  30-year for sepsis not currently met.  CT chest showed no evidence of acute pulm process, including evidence of allergy to suggest pneumonia, will CT abdomen pelvis showed no evidence of acute intra-abdominal or intrapelvic process.  Urinalysis is inconsistent with UTI, will COVID, flu Enza, RSV PCR are all negative.  He appears hemodynamically stable.  Therefore, in the absence of any evidence of underlying infectious process, will refrain from initiation of antibiotics at this time.  Plan: Add on procalcitonin level.  Repeat CBC in the morning.  Monitor on telemetry overnight.                                  #)  Chronic diastolic heart failure: documented history of such, with most recent echocardiogram performed in November 2024, which was notable for LVEF 50 to 55%, indeterminate diastolic parameters. No clinical evidence to suggest acutely decompensated heart failure at this time, including CT chest which showed no evidence of infiltrate, edema, effusion. home diuretic regimen reportedly consists of the following: Torsemide on twice daily basis.  As he had missed his torsemide dose earlier in the day, he received a dose of IV Lasix in the emergency department this evening.  Plan: monitor strict  I's & O's and daily weights. Repeat CMP in AM. Check serum mag level. Continue home diuretic regimen.  Add on BMP.                #) COPD: Documented history thereof, without clinical evidence of acute exacerbation at this time.   Outpatient respiratory regimen includes the following: Trelegy.  It is noted that he is wearing 2 to 3 L nasal cannula in the ED.  Unclear if he has any baseline supplemental oxygen requirements.  Will pursue additional chart review to evaluate this.  Plan: cont outpatient Trelegy. Prn albuterol nebulizer. Check CMP and serum magnesium level in the AM.  Check VBG.                   #) History of seizures: Documented history of such, without clinical evidence to suggest active seizures at this time.  Outpatient antiepileptic regimen consists of: Keppra.    Plan: Continue outpatient antiepileptic regimen.      DVT prophylaxis: SCD's   Code Status: DNR, consistent with active DNR form on file as well as code status documentation from most recent prior hospitalization Family Communication: none Disposition Plan: Per Rounding Team Consults called: none;  Admission status: Observation     I SPENT GREATER THAN 75  MINUTES IN CLINICAL CARE TIME/MEDICAL DECISION-MAKING IN COMPLETING THIS ADMISSION.      Chaney Born Remy Voiles DO [facility]  From 7PM - 7AM   10/03/2023, 12:34 AM

## 2023-10-04 DIAGNOSIS — F4310 Post-traumatic stress disorder, unspecified: Secondary | ICD-10-CM

## 2023-10-04 DIAGNOSIS — E162 Hypoglycemia, unspecified: Secondary | ICD-10-CM | POA: Diagnosis not present

## 2023-10-04 LAB — GLUCOSE, CAPILLARY
Glucose-Capillary: 192 mg/dL — ABNORMAL HIGH (ref 70–99)
Glucose-Capillary: 76 mg/dL (ref 70–99)
Glucose-Capillary: 92 mg/dL (ref 70–99)
Glucose-Capillary: 99 mg/dL (ref 70–99)

## 2023-10-04 LAB — HEMOGLOBIN A1C
Hgb A1c MFr Bld: 6.5 % — ABNORMAL HIGH (ref 4.8–5.6)
Hgb A1c MFr Bld: 6.6 % — ABNORMAL HIGH (ref 4.8–5.6)
Mean Plasma Glucose: 140 mg/dL
Mean Plasma Glucose: 142.72 mg/dL

## 2023-10-04 MED ORDER — OXYCODONE HCL 5 MG PO TABS
5.0000 mg | ORAL_TABLET | Freq: Once | ORAL | Status: AC
  Administered 2023-10-05: 5 mg via ORAL
  Filled 2023-10-04: qty 1

## 2023-10-04 MED ORDER — METOPROLOL SUCCINATE ER 50 MG PO TB24
50.0000 mg | ORAL_TABLET | Freq: Every day | ORAL | Status: DC
  Administered 2023-10-04 – 2023-10-10 (×7): 50 mg via ORAL
  Filled 2023-10-04 (×7): qty 1

## 2023-10-04 MED ORDER — METHOCARBAMOL 500 MG PO TABS
500.0000 mg | ORAL_TABLET | Freq: Three times a day (TID) | ORAL | Status: DC | PRN
  Administered 2023-10-04 – 2023-10-10 (×11): 500 mg via ORAL
  Filled 2023-10-04 (×12): qty 1

## 2023-10-04 MED ORDER — BUSPIRONE HCL 5 MG PO TABS: 10.0000 mg | ORAL_TABLET | Freq: Two times a day (BID) | ORAL | Status: DC

## 2023-10-04 MED ORDER — DICLOFENAC SODIUM 1 % EX GEL
4.0000 g | Freq: Four times a day (QID) | CUTANEOUS | Status: DC
  Administered 2023-10-04 – 2023-10-10 (×22): 4 g via TOPICAL
  Filled 2023-10-04: qty 100

## 2023-10-04 MED ORDER — VENLAFAXINE HCL ER 75 MG PO CP24: 150.0000 mg | ORAL_CAPSULE | Freq: Every day | ORAL | Status: DC

## 2023-10-04 MED ORDER — BUSPIRONE HCL 5 MG PO TABS
10.0000 mg | ORAL_TABLET | Freq: Three times a day (TID) | ORAL | Status: DC
  Administered 2023-10-04 – 2023-10-10 (×18): 10 mg via ORAL
  Filled 2023-10-04 (×18): qty 2

## 2023-10-04 MED ORDER — TRAZODONE HCL 50 MG PO TABS
200.0000 mg | ORAL_TABLET | Freq: Every evening | ORAL | Status: DC | PRN
Start: 2023-10-04 — End: 2023-10-10
  Administered 2023-10-06 – 2023-10-09 (×4): 200 mg via ORAL
  Filled 2023-10-04 (×4): qty 4
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
## 2023-10-04 NOTE — Hospital Course (Addendum)
 Lawrence [name] is a 68 y.o. male with a history of diabetes mellitus type 2, chronic diastolic heart failure, COPD, severe aortic stenosis s/p TAVR, seizure disorder, depression, anxiety, diastolic heart failure, morbid obesity.  Patient presented secondary to hypoglycemia with recurrent episodes. Physical therapy recommending SNF on discharge.

## 2023-10-04 NOTE — Progress Notes (Signed)
 PROGRESS NOTE    Lawrence [name]  ZOX:[id number] DOB: October 28, 1955 DOA: 10/02/2023 PCP: [name], PA-C   Brief Narrative: Lawrence [name] is a 68 y.o. male with a history of diabetes mellitus type 2, chronic diastolic heart failure, COPD, severe aortic stenosis s/p TAVR, seizure disorder, depression, anxiety, diastolic heart failure, morbid obesity.  Patient presented secondary to hypoglycemia with recurrent episodes.   Assessment and Plan:  Recurrent hypoglycemia In setting of insulin use for diabetes mellitus. Patient has recently had an increase in basal insulin by 5 units per history. Patient received D50 and home [place] held. Hypoglycemia resolved.  Diabetes mellitus type 2 Uncontrolled with hypoglycemia. Patient is on Tresiba, Prandin, Novolog and [place] as an outpatient, which was held. -Hold long acting insulin -Continue SSI  Acute metabolic encephalopathy Secondary to hypoglycemia. Resolved.  Paroxysmal atrial fibrillation Currently in sinus rhythm. Patient is on Eliquis and Toprol-XL as an outpatient. -Continue Eliquis and Toprol XL  SIRS No infectious source identified. Presentation seems likely related to hypoglycemia.  Chronic diastolic heart failure -Continue torsemide  History of seizures -Continue Keppra  Severe aortic stenosis s/p TAVR Noted.  History of CVA Left hemiplegia Noted. Patient is on Coumadin for paroxysmal atrial fibrillation. -PT/OT eval  Anxiety Depression -Continue Remeron -Continue Buspar and Abilify  BPH -Continue prazosin  COPD -Continue Incruse Ellipta and Breo Ellipta  Morbid obesity, class III Estimated body mass index is 44.3 kg/m as calculated from the following:   Height as of this encounter: 6\' 2"  (1.88 m).   Weight as of this encounter: 156.5 kg.   DVT prophylaxis: Eliquis Code Status:   Code Status: Limited: Do not attempt resuscitation (DNR) -DNR-LIMITED -Do Not Intubate/DNI  Family  Communication: None at bedside Disposition Plan: Discharge pending PT/OT recommendations   Consultants:  None  Procedures:  None  Antimicrobials: None    Subjective: Patient reports feeling weak and fatigued. No other issues overnight. Reports for the past several weeks, he has had worsening lower extremity strength to the point of not being able to ambulate. He reports staying in a chair most of the day.  Objective: BP 129/75   Pulse 84   Temp 98.3 F (36.8 C)   Resp 18   Ht 6\' 2"  (1.88 m)   Wt (!) 156.5 kg   SpO2 93%   BMI 44.30 kg/m   Examination:  General exam: Appears calm and comfortable Respiratory system: Clear to auscultation. Respiratory effort normal. Cardiovascular system: S1 & S2 heard, RRR. Gastrointestinal system: Abdomen is nondistended, soft and nontender. Normal bowel sounds heard. Central nervous system: Alert and oriented. 4/5 RLE and 3/5 LLE strength. Musculoskeletal: Non-pitting BLE edema.  Psychiatry: Judgement and insight appear normal. Mood & affect appropriate.    Data Reviewed: I have personally reviewed following labs and imaging studies  CBC Lab Results  Component Value Date   WBC 10.4 10/03/2023   RBC 4.61 10/03/2023   HGB 13.8 10/03/2023   HCT 41.8 10/03/2023   MCV 90.7 10/03/2023   MCH 29.9 10/03/2023   PLT 266 10/03/2023   MCHC 33.0 10/03/2023   RDW 15.1 10/03/2023   LYMPHSABS 1.7 10/03/2023   MONOABS 0.8 10/03/2023   EOSABS 0.4 10/03/2023   BASOSABS 0.0 10/03/2023     Last metabolic panel Lab Results  Component Value Date   NA 137 10/03/2023   K 4.0 10/03/2023   CL 104 10/03/2023   CO2 27 10/03/2023   BUN 17 10/03/2023   CREATININE 1.16 10/03/2023  GLUCOSE 103 (H) 10/03/2023   GFRNONAA >60 10/03/2023   GFRAA 77 05/20/2020   CALCIUM 8.2 (L) 10/03/2023   PHOS 3.1 07/23/2023   PROT 6.2 (L) 10/03/2023   ALBUMIN 3.4 (L) 10/03/2023   LABGLOB 2.8 02/08/2023   BILITOT 0.6 10/03/2023   ALKPHOS 35 (L) 10/03/2023    AST 19 10/03/2023   ALT 13 10/03/2023   ANIONGAP 6 10/03/2023    GFR: Estimated Creatinine Clearance: 99.1 mL/min (by C-G formula based on SCr of 1.16 mg/dL).  Recent Results (from the past 240 hours)  Resp panel by RT-PCR (RSV, Flu A&B, Covid) Anterior Nasal Swab     Status: None   Collection Time: 10/02/23  6:50 PM   Specimen: Anterior Nasal Swab  Result Value Ref Range Status   SARS Coronavirus 2 by RT PCR NEGATIVE NEGATIVE Final   Influenza A by PCR NEGATIVE NEGATIVE Final   Influenza B by PCR NEGATIVE NEGATIVE Final    Comment: (NOTE) The Xpert Xpress SARS-CoV-2/FLU/RSV plus assay is intended as an aid in the diagnosis of influenza from Nasopharyngeal swab specimens and should not be used as a sole basis for treatment. Nasal washings and aspirates are unacceptable for Xpert Xpress SARS-CoV-2/FLU/RSV testing.  Fact Sheet for Patients: BloggerCourse.com  Fact Sheet for Healthcare Providers: SeriousBroker.it  This test is not yet approved or cleared by the [place] FDA and has been authorized for detection and/or diagnosis of SARS-CoV-2 by FDA under an Emergency Use Authorization (EUA). This EUA will remain in effect (meaning this test can be used) for the duration of the COVID-19 declaration under Section 564(b)(1) of the Act, 21 U.S.C. section 360bbb-3(b)(1), unless the authorization is terminated or revoked.     Resp Syncytial Virus by PCR NEGATIVE NEGATIVE Final    Comment: (NOTE) Fact Sheet for Patients: BloggerCourse.com  Fact Sheet for Healthcare Providers: SeriousBroker.it  This test is not yet approved or cleared by the [place] FDA and has been authorized for detection and/or diagnosis of SARS-CoV-2 by FDA under an Emergency Use Authorization (EUA). This EUA will remain in effect (meaning this test can be used) for the duration of the COVID-19  declaration under Section 564(b)(1) of the Act, 21 U.S.C. section 360bbb-3(b)(1), unless the authorization is terminated or revoked.  Performed at [facility], [address] [place], [place] [address]       Radiology Studies: CT CHEST ABDOMEN PELVIS W CONTRAST Result Date: 10/02/2023 CLINICAL DATA:  Sepsis EXAM: CT CHEST, ABDOMEN, AND PELVIS WITH CONTRAST TECHNIQUE: Multidetector CT imaging of the chest, abdomen and pelvis was performed following the standard protocol during bolus administration of intravenous contrast. RADIATION DOSE REDUCTION: This exam was performed according to the departmental dose-optimization program which includes automated exposure control, adjustment of the mA and/or kV according to patient size and/or use of iterative reconstruction technique. CONTRAST:  75mL OMNIPAQUE IOHEXOL 350 MG/ML SOLN COMPARISON:  CTA chest abdomen pelvis dated 01/27/2023 FINDINGS: CT CHEST FINDINGS Cardiovascular: The heart is normal in size. No pericardial effusion. Status post TAVR. No evidence of thoracic aortic aneurysm. Atherosclerotic calcifications of the aortic arch. Moderate three-vessel coronary atherosclerosis. Mediastinum/Nodes: No suspicious mediastinal lymphadenopathy. Visualized thyroid is unremarkable. Lungs/Pleura: Mild centrilobular and paraseptal emphysematous changes, upper lung predominant. No suspicious pulmonary nodules. Mild scarring/atelectasis in the bilateral lower lobes. No focal consolidation. No pleural effusion pneumothorax. Musculoskeletal: Degenerative changes of the thoracic spine. CT ABDOMEN PELVIS FINDINGS Hepatobiliary: Mild hepatic steatosis. 4.1 cm inferior right hepatic lobe cyst (series 3/image 60). Gallbladder is  unremarkable. No intrahepatic or extrahepatic ductal dilatation. Pancreas: Within normal limits. Spleen: Within normal limits. Adrenals/Urinary Tract: Adrenal glands are within normal limits. Left kidney is within normal limits. Right lower  pole renal cysts measuring up to 2.8 cm (series 3/image 30), benign (Bosniak I). No follow-up is recommended. Bladder is normal limits. Stomach/Bowel: Stomach is within normal limits. No evidence of bowel obstruction. Small duodenal diverticulum. Normal appendix (series 3/image 94). Mild left colonic diverticulosis, without evidence of diverticulitis. Mild to moderate left colonic stool burden. Vascular/Lymphatic: No evidence of abdominal aortic aneurysm. Atherosclerotic calcifications of the abdominal aorta and branch vessels, although vessels remain patent. No suspicious abdominopelvic lymphadenopathy. Reproductive: Prostate is unremarkable. Other: No abdominopelvic ascites. Musculoskeletal: Mild degenerative changes of the lumbar spine. Spinal stimulator at T12-L1. IMPRESSION: No acute cardiopulmonary disease. Mild left colonic diverticulosis, without evidence of diverticulitis. Additional ancillary findings as above. Aortic Atherosclerosis (ICD10-I70.0) and Emphysema (ICD10-J43.9). Electronically Signed   By: Charline Bills M.D.   On: 10/02/2023 21:32   CT Head Wo Contrast Result Date: 10/02/2023 CLINICAL DATA:  Altered mental status and lethargy. Recent hospitalization for atrial fibrillation, declining strength in mentation since. History of stroke with left-sided weakness. EXAM: CT HEAD WITHOUT CONTRAST TECHNIQUE: Contiguous axial images were obtained from the base of the skull through the vertex without intravenous contrast. RADIATION DOSE REDUCTION: This exam was performed according to the departmental dose-optimization program which includes automated exposure control, adjustment of the mA and/or kV according to patient size and/or use of iterative reconstruction technique. COMPARISON:  MRI brain 07/14/2023.  CT head 07/25/2023 FINDINGS: Brain: No acute intracranial hemorrhage. No CT evidence of acute infarct. Nonspecific hypoattenuation in the periventricular and subcortical white matter favored to  reflect chronic microvascular ischemic changes. No edema, mass effect, or midline shift. The basilar cisterns are patent. Ventricles: The ventricles are normal. Vascular: No hyperdense vessel or unexpected calcification. Skull: No acute or aggressive finding. Orbits: Orbits are symmetric. Sinuses: Mucosal thickening in the ethmoid sinuses, right greater than left. Additional mucosal thickening in the sphenoid sinuses with small air-fluid level in the left sphenoid sinus. Mucosal thickening in the left maxillary sinus. Other: Mastoid air cells are clear. IMPRESSION: 1. No acute intracranial process. 2. Mild chronic microvascular ischemic changes. 3. Paranasal sinus disease with small air-fluid level in the left sphenoid sinus. Correlate for acute sinusitis. Electronically Signed   By: Emily Filbert M.D.   On: 10/02/2023 16:30   DG Chest Port 1 View Result Date: 10/02/2023 CLINICAL DATA:  Altered mental status. EXAM: PORTABLE CHEST 1 VIEW COMPARISON:  09/30/2023. FINDINGS: Low lung volume. There is diffuse moderate vascular congestion, which is accentuated by low lung volume. Bilateral lung fields are otherwise clear. Bilateral costophrenic angles are clear. Stable cardio-mediastinal silhouette. No acute osseous abnormalities. The soft tissues are within normal limits. IMPRESSION: *Moderate diffuse pulmonary vascular congestion, accentuated by low lung volume. Electronically Signed   By: Jules Schick M.D.   On: 10/02/2023 14:21      LOS: 0 days    Jacquelin Hawking, MD [facility] 10/04/2023, 10:05 AM   If 7PM-7AM, please contact night-coverage [email or web address]

## 2023-10-04 NOTE — Evaluation (Signed)
 Physical Therapy Evaluation Patient Details Name: Lawrence [name] MRN: [id number] DOB: 03/23/1956 Today's Date: 10/04/2023  History of Present Illness  Pt is a 68 y/o M admitted on 10/02/23 after presenting with AMS & lethargy. Pt is being treated for recurrent hypoglycemia. PMH: DM2, chronic diastolic heart failure, COPD, severe aortic stenosis s/p TAVR, seizure disorder, depression, anxiety, diastolic heart failure, morbid obesity, CVA with residual L sided weakness, Nov '24 L cerebellar CVA, spinal stimulator  Clinical Impression  Pt seen for PT evaluation with PT joining end of OT session. Prior to admission pt was requiring assistance with transfers to/from rollator with pt sitting on rollator & propelling around the home with his feet. Pt receives assistance from his wife & a personal care aide. On this date, pt requires MAX assist +2 for STS attempts from recliner with RW & then with steady with pt able to clear buttocks but unable to shift pelvis anteriorly & come to full upright standing. Pt would benefit from ongoing acute PT services to address strengthening, balance, endurance, to increase independence & decrease caregiver burden & fall risk with mobility. Recommend post acute rehab in setting <3 hours therapy/day upon d/c.        If plan is discharge home, recommend the following: Two people to help with walking and/or transfers;Two people to help with bathing/dressing/bathroom   Can travel by private vehicle   No    Equipment Recommendations Other (comment) (defer to next venue)  Recommendations for Other Services       Functional Status Assessment Patient has had a recent decline in their functional status and demonstrates the ability to make significant improvements in function in a reasonable and predictable amount of time.     Precautions / Restrictions Precautions Precautions: Fall Restrictions Weight Bearing Restrictions Per Provider Order: No      Mobility  Bed  Mobility               General bed mobility comments: not tested, pt received & left sitting in recliner    Transfers Overall transfer level: Needs assistance Equipment used: Rolling walker (2 wheels), Ambulation equipment used Transfers: Sit to/from Stand Sit to Stand: Max assist, +2 physical assistance, +2 safety/equipment, Via lift equipment           General transfer comment: STS attempt from recliner x 2 with pt able to clear buttocks but unable to fully shift pelvis anteriorly & come to upright standing; transitioned to using steady with pt able to clear buttocks more but still unable to shift pelvis anteriorly & come to upright standing to place steady pads underneath him Transfer via Lift Equipment: Stedy  Ambulation/Gait                  Stairs            Wheelchair Mobility     Tilt Bed    Modified Rankin (Stroke Patients Only)       Balance Overall balance assessment: Needs assistance Sitting-balance support: Feet supported, Bilateral upper extremity supported Sitting balance-Leahy Scale: Fair     Standing balance support: During functional activity, Bilateral upper extremity supported, Reliant on assistive device for balance Standing balance-Leahy Scale: Poor                               Pertinent Vitals/Pain Pain Assessment Pain Assessment: Faces Faces Pain Scale: Hurts little more Pain Location: bil feet Pain Descriptors / Indicators:  Discomfort Pain Intervention(s): Monitored during session    Home Living Family/patient expects to be discharged to:: Private residence Living Arrangements: Spouse/significant other Available Help at Discharge: Family;Personal care attendant Type of Home: Apartment Home Access: Level entry       Home Layout: One level Home Equipment: Rollator (4 wheels);Electric scooter;Hospital bed;BSC/3in1;Lift chair Additional Comments: PTA 4 days/week 8:30-11:30am    Prior Function Prior  Level of Function : Needs assist             Mobility Comments: was using rollator, however now sits on rollator and propels with feet/is pushed by girlfriend, assist for transfers ADLs Comments: assist for bathing/dressing     Extremity/Trunk Assessment   Upper Extremity Assessment Upper Extremity Assessment: Generalized weakness    Lower Extremity Assessment Lower Extremity Assessment: LLE deficits/detail;RLE deficits/detail (hx of BLE neuropathy) RLE Deficits / Details: 3/5 knee extension in sitting LLE Deficits / Details: 2-/5 knee extension 2/2 hx of CVA       Communication        Cognition Arousal: Alert Behavior During Therapy: WFL for tasks assessed/performed                               Following commands impaired: Follows one step commands with increased time     Cueing Cueing Techniques: Verbal cues, Tactile cues     General Comments      Exercises     Assessment/Plan    PT Assessment Patient needs continued PT services  PT Problem List Decreased strength;Decreased activity tolerance;Decreased mobility;Decreased balance;Decreased range of motion;Decreased knowledge of use of DME;Pain       PT Treatment Interventions DME instruction;Balance training;Modalities;Gait training;Neuromuscular re-education;Stair training;Therapeutic activities;Therapeutic exercise;Functional mobility training;Patient/family education;Wheelchair mobility training;Manual techniques    PT Goals (Current goals can be found in the Care Plan section)  Acute Rehab PT Goals Patient Stated Goal: get better PT Goal Formulation: With patient Time For Goal Achievement: 10/18/23 Potential to Achieve Goals: Good    Frequency Min 1X/week     Co-evaluation PT/OT/SLP Co-Evaluation/Treatment: Yes Reason for Co-Treatment: For patient/therapist safety PT goals addressed during session: Mobility/safety with mobility         AM-PAC PT "6 Clicks" Mobility  Outcome  Measure Help needed turning from your back to your side while in a flat bed without using bedrails?: A Lot Help needed moving from lying on your back to sitting on the side of a flat bed without using bedrails?: A Lot Help needed moving to and from a bed to a chair (including a wheelchair)?: Total Help needed standing up from a chair using your arms (e.g., wheelchair or bedside chair)?: Total Help needed to walk in hospital room?: Total Help needed climbing 3-5 steps with a railing? : Total 6 Click Score: 8    End of Session Equipment Utilized During Treatment: Gait belt Activity Tolerance: Patient tolerated treatment well Patient left: in chair;with chair alarm set;with call bell/phone within reach Nurse Communication: Mobility status PT Visit Diagnosis: Muscle weakness (generalized) (M62.81);Other abnormalities of gait and mobility (R26.89)    Time: 0102-7253 PT Time Calculation (min) (ACUTE ONLY): 14 min   Charges:   PT Evaluation $PT Eval Moderate Complexity: 1 Mod   PT General Charges $$ ACUTE PT VISIT: 1 Visit         Aleda Grana, PT, DPT 10/04/23, 2:36 PM   [name] 10/04/2023, 2:34 PM

## 2023-10-04 NOTE — Progress Notes (Signed)
 Initial Nutrition Assessment  DOCUMENTATION CODES:  Morbid obesity  INTERVENTION:  Calorie count initiated to monitor for poor PO intake as contributor to admission MVI w/ minerals Discussed diabetic diet and insulin regimen   NUTRITION DIAGNOSIS:  Altered nutrition lab value related to chronic illness as evidenced by other (comment) (T2DM/hypoglycemia).  GOAL:  Patient will meet greater than or equal to 90% of their needs  MONITOR:  Labs, PO intake  REASON FOR ASSESSMENT:  Consult Calorie Count  ASSESSMENT:   Pt presenting with AMS/lethargy and hypoglycemia. PMH: T2DM, COPD, seizure disorder, chronic diastolic heart failure, severe aortic stenosis s/p TAVR in July 2024.  Patient pleasant and conversant during bedside visit today.   Calorie count re-initiated today to assess intake as potential contributor to hypoglycemic episodes. He endorses adequate appetite at home and states recent increase in basal insulin as cause. He reports no recent issues with chewing or swallowing and no N/V/C/D.   24 Hour Recall B: scramble w/ eggs, sausage, peppers, and onions, juice/coffee L: leftovers from dinner the night before OR sandwich OR cereal D: chicken, potatoes, green beans Snacks: cheese curls, chips, or fruit  Discussed importance of protein intake with carbohydrate sources. Reviewed complex carbohydrates versus simple carbohydrates and refined sugars. Most recent A1c 6.6, suggesting adequate blood sugar management prior to increase in insulin dosage. He could not recall the reason for the insulin adjustment.   UBW reported as 157kg with no significant weight changes or recent past. This is verified via chart review. Some mild, non-pitting edema observed to BLEs. Pt endorses this is baseline and 2/2 increased immobility/weakness in last couple of weeks.   Admit/Current Weight: 156.5kg  Did require D50 to stabilize blood sugars and one dose of furosemide in the ED. Long-acting  insulin on hold currently and sensitive SSI regimen in place.   Meds: cyanocobalamin, ferrous sulfate, SSI, mirtazipine, Miralax, torsemide  Labs:  CBGs 60-103 x48 hours A1c 6.6 (09/2023)  NUTRITION - FOCUSED PHYSICAL EXAM:  Flowsheet Row Most Recent Value  Orbital Region No depletion  Upper Arm Region No depletion  Thoracic and Lumbar Region No depletion  Buccal Region No depletion  Temple Region No depletion  Clavicle Bone Region No depletion  Clavicle and Acromion Bone Region No depletion  Scapular Bone Region No depletion  Dorsal Hand No depletion  Patellar Region No depletion  Anterior Thigh Region No depletion  Posterior Calf Region No depletion  Edema (RD Assessment) Mild  Hair Reviewed  Eyes Reviewed  Mouth Reviewed  Skin Reviewed  Nails Reviewed    Diet Order:   Diet Order             Diet regular Room service appropriate? Yes; Fluid consistency: Thin  Diet effective now             EDUCATION NEEDS:  Education needs have been addressed  Skin:  Skin Assessment: Reviewed RN Assessment  Last BM:  2/25  Height:  Ht Readings from Last 1 Encounters:  10/02/23 6\' 2"  (1.88 m)   Weight:  Wt Readings from Last 1 Encounters:  10/02/23 (!) 156.5 kg    Ideal Body Weight:  86.4 kg  BMI:  Body mass index is 44.3 kg/m.  Estimated Nutritional Needs:   Kcal:  2100-2300kcal  Protein:  95-110g  Fluid:  >2L/day  Myrtie Cruise MS, RD, LDN Registered Dietitian Clinical Nutrition RD Inpatient Contact Info in Amion

## 2023-10-04 NOTE — TOC CM/SW Note (Addendum)
 Transition of Care [facility]) - Inpatient Brief Assessment   Patient Details  Name: Lawrence [name] MRN: [id number] Date of Birth: 06/21/1956  [facility]) CM/SW Contact:    Tom-Johnson, Hershal Coria, RN Phone Number: 10/04/2023, 1:46 PM   Clinical Narrative:  Patient presented to the ED with AMS/Lethargy and Low Blood Sugar. Admitted with Hypoglycemia. Patient on [place] Severe, Novolog and [place] at home. Also on Eliquis.  Has hx of Type 2 DM, COPD, Seizure Disorder, Chronic Diastolic Heart Failure, and severe Aortic Stenosis s/p TAVR in July 2024.    From home with girlfriend, [name]. Has a daughter who lives in [place]. Modified Independent at home. Does not drive, uses [facility] to transport to and from appointments.  Has all necessary DME's at home.  PCP is [name], PA-C and uses [facility] on [address]. Patient is currently active with home health disciplines with [facility]. Resumption of care order placed, info on AVS.  Patient is active with the [facility]. Notification # is [id number] and Authorization # is [id number].  Patient not Medically ready for discharge.  CM will continue to follow as patient progresses with care towards discharge.                  Transition of Care Asessment: Insurance and Status: Insurance coverage has been reviewed Patient has primary care physician: Yes Home environment has been reviewed: Yes Prior level of function:: Modified Independent Prior/Current Home Services: Current home services (Home health) Social Drivers of Health Review: SDOH reviewed no interventions necessary Readmission risk has been reviewed: Yes Transition of care needs: transition of care needs identified, TOC will continue to follow

## 2023-10-04 NOTE — Evaluation (Signed)
 Occupational Therapy Evaluation Patient Details Name: Lawrence [name] MRN: [id number] DOB: 03/13/1956 Today's Date: 10/04/2023   History of Present Illness   Pt is a 68 y/o M admitted on 10/02/23 after presenting with AMS & lethargy. Pt is being treated for recurrent hypoglycemia. PMH: DM2, chronic diastolic heart failure, COPD, severe aortic stenosis s/p TAVR, seizure disorder, depression, anxiety, diastolic heart failure, morbid obesity, CVA with residual L sided weakness, Nov '24 L cerebellar CVA, spinal stimulator     Clinical Impressions Pt reports having assist at baseline from girlfriend/PCA for ADLs, has been sitting and using feet to get around on rollator at home. Pt currently needing up to max A for ADLs, min A for bed mobility and max+2 for transfers with RW and standing with Stedy. Pt presenting with impairments listed below, will follow acutely. Patient will benefit from continued inpatient follow up therapy, <3 hours/day to maximize safety/ind with ADL/functional mobility.      If plan is discharge home, recommend the following:   Two people to help with walking and/or transfers;A lot of help with bathing/dressing/bathroom;Assistance with cooking/housework;Direct supervision/assist for medications management;Direct supervision/assist for financial management;Assist for transportation;Help with stairs or ramp for entrance     Functional Status Assessment   Patient has had a recent decline in their functional status and demonstrates the ability to make significant improvements in function in a reasonable and predictable amount of time.     Equipment Recommendations   Audiological scientist for [employer]   PT consult     Precautions/Restrictions   Precautions Precautions: Fall Restrictions [employer] Bearing Restrictions Per Provider Order: No     Mobility Bed Mobility Overal bed mobility: Needs Assistance Bed Mobility: Supine to Sit      Supine to sit: Min assist     General bed mobility comments: min A for trunk elevation    Transfers Overall transfer level: Needs assistance Equipment used: Rolling walker (2 wheels), Ambulation equipment used Transfers: Sit to/from Stand Sit to Stand: Max assist, +2 physical assistance, +2 safety/equipment, Via Scientist, product/process development via Lift Equipment: Stedy    Balance Overall balance assessment: Needs assistance Sitting-balance support: Feet supported, Bilateral upper extremity supported Sitting balance-Leahy Scale: Fair     Standing balance support: During functional activity, Bilateral upper extremity supported, Reliant on assistive device for balance Standing balance-Leahy Scale: Poor                             ADL either performed or assessed with clinical judgement   ADL Overall ADL's : Needs assistance/impaired Eating/Feeding: Set up;Sitting   Grooming: Set up;Sitting   Upper Body Bathing: Moderate assistance   Lower Body Bathing: Maximal assistance   Upper Body Dressing : Moderate assistance   Lower Body Dressing: Maximal assistance   Toilet Transfer: Maximal assistance;+2 for physical assistance   Toileting- Clothing Manipulation and Hygiene: Total assistance       Functional mobility during ADLs: Maximal assistance;+2 for physical assistance;Rolling walker (2 wheels)       Vision   Vision Assessment?: No apparent visual deficits     Perception Perception: Not tested       Praxis Praxis: Not tested       Pertinent Vitals/Pain Pain Assessment Pain Assessment: Faces Pain Score: 3  Faces Pain Scale: Hurts little more Pain Location: bil feet Pain Descriptors / Indicators:  Discomfort Pain Intervention(s): Limited activity within patient's tolerance, Monitored during session, Repositioned     Extremity/Trunk Assessment Upper Extremity Assessment Upper Extremity Assessment: Generalized weakness   Lower  Extremity Assessment Lower Extremity Assessment: Defer to PT evaluation RLE Deficits / Details: 3/5 knee extension in sitting LLE Deficits / Details: 2-/5 knee extension 2/2 hx of CVA   Cervical / Trunk Assessment Cervical / Trunk Assessment: Other exceptions (body habitus)   Communication Communication Communication: No apparent difficulties   Cognition Arousal: Alert Behavior During Therapy: WFL for tasks assessed/performed Cognition: No apparent impairments                                 Following commands impaired: Follows one step commands with increased time     Cueing  General Comments   Cueing Techniques: Verbal cues;Tactile cues      Exercises     Shoulder Instructions      Home Living Family/patient expects to be discharged to:: Private residence Living Arrangements: Spouse/significant other Available Help at Discharge: Family;Personal care attendant Type of Home: Apartment Home Access: Level entry     Home Layout: One level     Bathroom Shower/Tub: Tub/shower unit;Sponge bathes at baseline   Bathroom Toilet: Standard Bathroom Accessibility: Yes   Home Equipment: Rollator (4 wheels);Electric scooter;Hospital bed;BSC/3in1;Lift chair   Additional Comments: PTA 4 days/week 8:30-11:30am      Prior Functioning/Environment Prior Level of Function : Needs assist             Mobility Comments: was using rollator, however now sits on rollator and propels with feet/is pushed by girlfriend, assist for transfers ADLs Comments: assist for bathing/dressing    OT Problem List: Decreased strength;Decreased range of motion;Decreased activity tolerance;Impaired balance (sitting and/or standing);Decreased safety awareness   OT Treatment/Interventions: Self-care/ADL training;Therapeutic exercise;DME and/or AE instruction;Energy conservation;Therapeutic activities;Patient/family education;Balance training      OT Goals(Current goals can be  found in the care plan section)   Acute Rehab OT Goals Patient Stated Goal: none stated OT Goal Formulation: With patient Time For Goal Achievement: 10/18/23 Potential to Achieve Goals: Good ADL Goals Pt Will Perform Upper Body Dressing: with min assist;sitting Pt Will Perform Lower Body Dressing: with mod assist;sitting/lateral leans;sit to/from stand Pt Will Transfer to Toilet: with mod assist;bedside commode;squat pivot transfer;stand pivot transfer Additional ADL Goal #1: pt will perform bed mobility CGA in prep for ADLs   OT Frequency:  Min 1X/week    Co-evaluation   Reason for Co-Treatment: For patient/therapist safety PT goals addressed during session: Mobility/safety with mobility        AM-PAC OT "6 Clicks" Daily Activity     Outcome Measure Help from another person eating meals?: A Little Help from another person taking care of personal grooming?: A Little Help from another person toileting, which includes using toliet, bedpan, or urinal?: A Lot Help from another person bathing (including washing, rinsing, drying)?: A Lot Help from another person to put on and taking off regular upper body clothing?: A Lot Help from another person to put on and taking off regular lower body clothing?: A Lot 6 Click Score: 14   End of Session Equipment Utilized During Treatment: Rolling walker (2 wheels);Gait belt;Other (comment) (stedy) Nurse Communication: Mobility status  Activity Tolerance: Patient tolerated treatment well Patient left: with call bell/phone within reach;in chair;with chair alarm set  OT Visit Diagnosis: Unsteadiness on feet (R26.81);Other abnormalities of gait and mobility (R26.89);Repeated  falls (R29.6);Muscle weakness (generalized) (M62.81)                Time: 2130-8657 OT Time Calculation (min): 38 min Charges:  OT General Charges $OT Visit: 1 Visit OT Evaluation $OT Eval Moderate Complexity: 1 Mod OT Treatments $Self Care/Home Management : 8-22  mins  Carver Fila, OTD, OTR/L SecureChat Preferred Acute Rehab [phone]   Carver Fila Koonce 10/04/2023, 3:12 PM

## 2023-10-05 ENCOUNTER — Ambulatory Visit: Payer: 59 | Admitting: Podiatry

## 2023-10-05 DIAGNOSIS — F4310 Post-traumatic stress disorder, unspecified: Secondary | ICD-10-CM | POA: Diagnosis not present

## 2023-10-05 DIAGNOSIS — F332 Major depressive disorder, recurrent severe without psychotic features: Secondary | ICD-10-CM

## 2023-10-05 DIAGNOSIS — E162 Hypoglycemia, unspecified: Secondary | ICD-10-CM | POA: Diagnosis not present

## 2023-10-05 DIAGNOSIS — R4182 Altered mental status, unspecified: Secondary | ICD-10-CM

## 2023-10-05 LAB — GLUCOSE, CAPILLARY
Glucose-Capillary: 107 mg/dL — ABNORMAL HIGH (ref 70–99)
Glucose-Capillary: 170 mg/dL — ABNORMAL HIGH (ref 70–99)
Glucose-Capillary: 100 mg/dL — ABNORMAL HIGH (ref 70–99)
Glucose-Capillary: 131 mg/dL — ABNORMAL HIGH (ref 70–99)

## 2023-10-05 MED ORDER — HYDRALAZINE HCL 25 MG PO TABS: 25.0000 mg | ORAL_TABLET | Freq: Four times a day (QID) | ORAL | Status: DC | PRN

## 2023-10-05 MED ORDER — VENLAFAXINE HCL ER 75 MG PO CP24
150.0000 mg | ORAL_CAPSULE | Freq: Every day | ORAL | Status: DC
  Administered 2023-10-05 – 2023-10-10 (×6): 150 mg via ORAL
  Filled 2023-10-05 (×6): qty 2

## 2023-10-05 NOTE — Consult Note (Signed)
 [facility] Psychiatric Consult Follow up  Patient Name: .Lawrence [name]  MRN: [id number]  DOB: 09/04/1955  Consult Order details:  Orders (From admission, onward)     Start     Ordered   10/03/23 1553  IP CONSULT TO PSYCHIATRY       Ordering Provider: Barnetta Chapel, MD  Provider:  (Not yet assigned)  Question Answer Comment  Location [facility]   Reason for Consult? Psych consult      10/03/23 1552             Mode of Visit: In person    Psychiatry Consult Evaluation  Service Date: October 05, 2023 LOS:  LOS: 0 days  Chief Complaint hypoglycemia   Primary Psychiatric Diagnoses  Major depressive disorder, recurrent, moderate 2.  PTSD   Assessment  Lawrence [name] is a 68 y.o. male admitted: Medicallyfor 10/02/2023 11:31 AM for hypoglycemia. He carries the psychiatric diagnoses of PTSD, MDD and has a past medical history of  type 2 diabetes mellitus, chronic diastolic heart failure, COPD, severe aortic stenosis status post TAVR in July 2024, seizure disorder.   He meets criteria for PTSD based on flashbacks and nightmares over traumatic events.  Current outpatient psychotropic medications include Buspar, Remeron, Effexor, Trazodone and Abilify and historically he has had a positive response to these medications. He was compliant with medications prior to admission as evidenced by self-report. On initial examination, patient was pleasant, engaged easily in conversation, cried at times. Please see plan below for detailed recommendations.   Diagnoses:  Active Hospital problems: Principal Problem:   Hypoglycemia Active Problems:   Chronic diastolic CHF (congestive heart failure) (HCC)   DM2 (diabetes mellitus, type 2) (HCC)   Acute metabolic encephalopathy   SIRS (systemic inflammatory response syndrome) (HCC)   History of COPD   History of seizures    Plan   ## Psychiatric Medication Recommendations:  Continue Remeron 15 mg Continue  Trazodone 200 mg daily at bedtime Continue Ability 5 mg daily Continue Effexor 150 mg daily Increase Buspar 10 mg daily to BID   ## Medical Decision Making Capacity: Not specifically addressed in this encounter  ## Further Work-up:  -- most recent EKG on 10/02/2023 had QtC of 476 -- Pertinent labwork reviewed earlier this admission includes: CBC, U/A, blood gas, CMP, A1C   ## Disposition:-- There are no psychiatric contraindications to discharge at this time  ## Behavioral / Environmental: - No specific recommendations at this time.     ## Safety and Observation Level:  - Based on my clinical evaluation, I estimate the patient to be at low risk of self harm in the current setting. - At this time, we recommend  routine. This decision is based on my review of the chart including patient's history and current presentation, interview of the patient, mental status examination, and consideration of suicide risk including evaluating suicidal ideation, plan, intent, suicidal or self-harm behaviors, risk factors, and protective factors. This judgment is based on our ability to directly address suicide risk, implement suicide prevention strategies, and develop a safety plan while the patient is in the clinical setting. Please contact our team if there is a concern that risk level has changed.  CSSR Risk Category:C-SSRS RISK CATEGORY: Moderate Risk  Suicide Risk Assessment: Patient has following modifiable risk factors for suicide: None, which we are addressing by setting up a therapy appointment. Patient has following non-modifiable or demographic risk factors for suicide: male gender, history of suicide attempt,  and psychiatric hospitalization Patient has the following protective factors against suicide: Access to outpatient mental health care, Supportive family, Cultural, spiritual, or religious beliefs that discourage suicide, Pets in the home, and no history of NSSIB  Thank you for this consult  request. Recommendations have been communicated to the primary team.  We will continue to follow at this time.   [name], NP       History of Present Illness  Relevant Aspects of [facility]:  Admitted on 10/02/2023 for hypoglycemia.   Patient Report:  The client was watching tv prior to the assessment.  He engaged easily in conversation.  His depression has increased recently to a moderate level since he started developing weakness in both legs and cannot walk along with the helicopter/plane crash on the news.  Passive suicidal ideations at times, none currently.  One past suicide threat to shoot himself when he and his wife were divorcing in 2010, hospitalized afterwards, no other hospitalizations.  "I learned my lesson."  He has a provider for medication management at [facility] and compliant with his medications.  Appetite is increased in the hospital, sleep is "ok" in the hospital, "fairly well" at home.  Anxiety is moderate with no panic attacks.  Denies psychosis and substance abuse.  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  Nightmares started back nightly after the helicopter/plane crash in [place] recently as he had comrades in the [place] who had a helicopter crash with 3 of the 4 dying along and finding people after boat and plane crashes.  Some flashbacks recently, "I thought I had this behind me", tearful at times as he talked about things.  Therapeutic communication provided along with support.  He lives with a supportive girlfriend and his "little angel", Chloe, his dog.  Discussed medications and agreeable to increase his Buspar and to attend therapy, called placed to [facility] to arrange this for him.  2/27: The client is "hanging in there".  His depression is "fair high" as he is overwhelmed with recent information that his girlfriend told him that she can no longer take care of him.  They have been together since 2008 and have a  house together.  He is scheduled to go to rehab after stabilization and then "I know I'm going to have to go to a nursing home."   His girlfriend is still being supportive just can no longer physically care for him.  He was able to transfer with a lift to the chair today with PT.  Emotional support provided as he processed this information.  He did report his suicidal ideations are "not so bad" today, passive in nature with no intent.   A couple of flashbacks today, less PTSD symptoms today.  His appetite is "good", sleep was not good, "I fear I will not wake up".  He is also having pain in his legs and visibly restlessness noted on assessment. He was interested in therapy and an appointment was made at his outpatient facility, time and date given to him which he put in his phone.  4/15 at 10 am with Adalberto Ill at [facility].  Will continue to monitor his sleep before adjusting medications as he is on 200 mg of Trazodone and 15 mg of  Remeron.  Psych ROS:  Depression: moderate Anxiety:  moderate Mania (lifetime and current): denies Psychosis: (lifetime and current): denies  Review of Systems  Constitutional: Negative.   HENT: Negative.  Eyes: Negative.   Respiratory: Negative.    Cardiovascular: Negative.   Gastrointestinal: Negative.   Genitourinary: Negative.   Musculoskeletal:        Pain and restlessness in his legs  Skin: Negative.   Neurological: Negative.   Psychiatric/Behavioral:  Positive for depression. The patient is nervous/anxious.      Psychiatric and Social History  Psychiatric History:  Information collected from patient and chart  Prev Dx/Sx: PTSD, MDD, anxiety Current Psych Provider: Katheran James. 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 rate 18, height 6\' 2"  (1.88 m), weight (!) 156.5 kg, SpO2 94%. Body mass index is 44.3 kg/m.  Physical Exam Vitals and nursing note reviewed.  Constitutional:      Appearance: Normal appearance.  HENT:     Head: Normocephalic.     Nose: Nose normal.  Pulmonary:     Effort: Pulmonary effort is normal.  Neurological:     General: No focal deficit present.     Mental Status: He is alert and oriented to person, place, and time.     Mental Status Exam: General Appearance: Casual  Orientation:  Full (Time, Place, and Person)  Memory:  Immediate;   Good Recent;   Good Remote;   Good  Concentration:  Concentration: Good and Attention Span: Good  Recall:  Good  Attention  Good  Eye Contact:  Good  Speech:  Normal Rate  Language:  Good  Volume:  Normal  Mood: depressed, anxious  Affect:  Congruent  Thought Process:  Coherent  Thought Content:  Logical  Suicidal Thoughts:  No  Homicidal Thoughts:  No  Judgement:  Good  Insight:  Good  Psychomotor Activity:  Decreased  Akathisia:  No  Fund of Knowledge:  Good      Assets:  Housing Intimacy Leisure Time Resilience Social Support  Cognition:  WNL  ADL's:  Impaired  AIMS (if indicated):        Other History   These have been pulled in through the EMR,  reviewed, and updated if appropriate.  Family History:  The patient's family history includes CAD in his maternal grandfather; Diabetes in an other family member.  Medical History: Past Medical History:  Diagnosis Date   Acquired dilation of ascending aorta and aortic root (HCC)    40mm by echo 01/2021   Adenomatous colon polyp 2007   Anemia    Anxiety    Asthma    BPH without obstruction/lower urinary tract symptoms 02/22/2017   Chronic diastolic (congestive) heart failure (HCC)    Chronic venous stasis 03/07/2019   COPD (chronic obstructive pulmonary disease) (HCC)    Coronary artery calcification  seen on CAT scan    Depression    Diabetic neuropathy (HCC) 09/11/2019   History of colon polyps 08/24/2018   Hypertension    Morbid obesity (HCC)    OSA (obstructive sleep apnea)    Pain due to onychomycosis of toenails of both feet 09/11/2019   Peripheral neuropathy 02/22/2017   Primary osteoarthritis, left shoulder 03/05/2017   PTSD (post-traumatic stress disorder)    Pure hypercholesterolemia    QT prolongation 03/07/2019   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 Lynnette Caffey and Dr. Laneta Simmers   Seizures [facility])    Severe aortic stenosis    Sinus tachycardia 03/07/2019   Sleep apnea    CPAP   Type 2 diabetes mellitus with vascular disease (HCC) 09/11/2019    Surgical History: Past Surgical History:  Procedure Laterality Date   ENDOVENOUS ABLATION SAPHENOUS VEIN W/ LASER Right 08/20/2020   endovenous laser ablation right greater saphenous vein by Cari Caraway MD    ENDOVENOUS ABLATION SAPHENOUS VEIN W/ LASER Left 11/16/2022   endovenous laser ablation left greater saphenous vein by Cari Caraway MD   INTRAOPERATIVE TRANSTHORACIC ECHOCARDIOGRAM N/A 02/07/2023   Procedure: INTRAOPERATIVE TRANSTHORACIC ECHOCARDIOGRAM;  Surgeon: Orbie Pyo, MD;  Location: [facility] INVASIVE CV LAB;  Service: Open Heart Surgery;  Laterality:  N/A;   JOINT REPLACEMENT     left knee replacement x 2   KNEE ARTHROSCOPY Bilateral    LEFT HEART CATH AND CORONARY ANGIOGRAPHY N/A 01/17/2018   Procedure: LEFT HEART CATH AND CORONARY ANGIOGRAPHY;  Surgeon: Marykay Lex, MD;  Location: [facility] INVASIVE CV LAB;  Service: Cardiovascular;  Laterality: N/A;   PRESSURE SENSOR/CARDIOMEMS N/A 08/26/2021   Procedure: PRESSURE SENSOR/CARDIOMEMS;  Surgeon: Laurey Morale, MD;  Location: [facility] INVASIVE CV LAB;  Service: Cardiovascular;  Laterality: N/A;   RIGHT HEART CATH N/A 08/26/2021   Procedure: RIGHT HEART CATH;  Surgeon: Laurey Morale, MD;  Location: [facility] INVASIVE CV LAB;  Service: Cardiovascular;  Laterality: N/A;   RIGHT HEART CATH AND CORONARY ANGIOGRAPHY N/A 01/26/2023   Procedure: RIGHT HEART CATH AND CORONARY ANGIOGRAPHY;  Surgeon: Orbie Pyo, MD;  Location: [facility] INVASIVE CV LAB;  Service: Cardiovascular;  Laterality: N/A;   TEE WITHOUT CARDIOVERSION N/A 08/26/2021   Procedure: TRANSESOPHAGEAL ECHOCARDIOGRAM (TEE);  Surgeon: Laurey Morale, MD;  Location: [facility] ENDOSCOPY;  Service: Cardiovascular;  Laterality: N/A;   TOOTH EXTRACTION N/A 02/03/2023   Procedure: DENTAL RESTORATION/EXTRACTIONS;  Surgeon: [name], DMD;  Location: [facility] OR;  Service: Oral Surgery;  Laterality: N/A;   TRANSCATHETER AORTIC VALVE REPLACEMENT, TRANSFEMORAL Right 02/07/2023   Procedure: Transcatheter Aortic Valve Replacement, Transfemoral;  Surgeon: Orbie Pyo, MD;  Location: [facility] INVASIVE CV LAB;  Service: Open Heart Surgery;  Laterality: Right;   UMBILICAL HERNIA REPAIR       Medications:   Current Facility-Administered Medications:    acetaminophen (TYLENOL) tablet 1,000 mg, 1,000 mg, Oral, Q6H PRN, Ernie Avena, MD, 1,000 mg at 10/04/23 1700   albuterol (PROVENTIL) (2.5 MG/3ML) 0.083% nebulizer solution 2.5 mg, 2.5 mg, Nebulization, Q4H PRN, Howerter, Justin B, DO   apixaban (ELIQUIS) tablet 5 mg, 5 mg, Oral, BID, Howerter, Justin B, DO, 5 mg at  10/05/23 0859   ARIPiprazole (ABILIFY) tablet 5 mg, 5 mg, Oral, QHS, Ogbata, Sylvester I, MD, 5 mg at 10/04/23 2152   atorvastatin (LIPITOR) tablet 40 mg, 40 mg, Oral, QHS, Howerter, Justin B, DO, 40 mg at 10/04/23 2151   busPIRone (BUSPAR)  tablet 10 mg, 10 mg, Oral, TID, Narda Bonds, MD, 10 mg at 10/05/23 0900   cyanocobalamin (VITAMIN B12) tablet 1,000 mcg, 1,000 mcg, Oral, Daily, Berton Mount I, MD, 1,000 mcg at 10/05/23 0900   dextrose 50 % solution 50 mL, 1 ampule, Intravenous, Q1H PRN, Howerter, Justin B, DO   diclofenac Sodium (VOLTAREN) 1 % topical gel 4 g, 4 g, Topical, QID, Narda Bonds, MD, 4 g at 10/05/23 1301   ferrous sulfate tablet 324 mg, 324 mg, Oral, Q breakfast, Berton Mount I, MD, 324 mg at 10/05/23 0901   fluticasone furoate-vilanterol (BREO ELLIPTA) 200-25 MCG/ACT 1 puff, 1 puff, Inhalation, Daily, 1 puff at 10/05/23 0804 **AND** umeclidinium bromide (INCRUSE ELLIPTA) 62.5 MCG/ACT 1 puff, 1 puff, Inhalation, Daily, Howerter, Justin B, DO, 1 puff at 10/05/23 1610   hydrALAZINE (APRESOLINE) tablet 25 mg, 25 mg, Oral, Q6H PRN, Narda Bonds, MD   HYDROcodone-acetaminophen (NORCO/VICODIN) 5-325 MG per tablet 1 tablet, 1 tablet, Oral, Q4H PRN, Berton Mount I, MD, 1 tablet at 10/05/23 1259   insulin aspart (novoLOG) injection 0-6 Units, 0-6 Units, Subcutaneous, TID WC, Howerter, Justin B, DO, 1 Units at 10/05/23 1259   lacosamide (VIMPAT) tablet 150 mg, 150 mg, Oral, BID, Berton Mount I, MD, 150 mg at 10/05/23 0859   levETIRAcetam (KEPPRA) tablet 500 mg, 500 mg, Oral, BID, Howerter, Justin B, DO, 500 mg at 10/05/23 0900   melatonin tablet 3 mg, 3 mg, Oral, QHS PRN, Howerter, Justin B, DO, 3 mg at 10/05/23 0157   methocarbamol (ROBAXIN) tablet 500 mg, 500 mg, Oral, Q8H PRN, Narda Bonds, MD, 500 mg at 10/04/23 1925   metoprolol succinate (TOPROL-XL) 24 hr tablet 50 mg, 50 mg, Oral, Daily, Narda Bonds, MD, 50 mg at 10/05/23 0901   mirtazapine  (REMERON) tablet 15 mg, 15 mg, Oral, QHS, Berton Mount I, MD, 15 mg at 10/04/23 2151   naloxone [facility]) injection 0.4 mg, 0.4 mg, Intravenous, PRN, Howerter, Justin B, DO   ondansetron (ZOFRAN) injection 4 mg, 4 mg, Intravenous, Q6H PRN, Howerter, Justin B, DO   polyethylene glycol (MIRALAX / GLYCOLAX) packet 17 g, 17 g, Oral, Daily, Berton Mount I, MD, 17 g at 10/04/23 0842   pregabalin (LYRICA) capsule 150 mg, 150 mg, Oral, TID, Berton Mount I, MD, 150 mg at 10/05/23 0859   sodium chloride flush (NS) 0.9 % injection 3 mL, 3 mL, Intravenous, Q12H, Ernie Avena, MD, 3 mL at 10/05/23 0901   sodium chloride flush (NS) 0.9 % injection 3 mL, 3 mL, Intravenous, PRN, Ernie Avena, MD   torsemide [facility]) tablet 60 mg, 60 mg, Oral, BID, Howerter, Justin B, DO, 60 mg at 10/05/23 0859   traZODone (DESYREL) tablet 200 mg, 200 mg, Oral, QHS PRN, Charm Rings, NP   venlafaxine XR (EFFEXOR-XR) 24 hr capsule 150 mg, 150 mg, Oral, Q breakfast, Mariel Craft, MD, 150 mg at 10/05/23 1424  Allergies: Allergies  Allergen Reactions   Vancomycin Other (See Comments)    "Red Man Syndrome" 02/02/17: possible cause for rash under both arms   Niacin And Related Other (See Comments)    Red man syndrome   Tubersol [Tuberculin, Ppd] Other (See Comments)    Reaction unknown   Doxycycline Rash and Other (See Comments)    [name], NP

## 2023-10-05 NOTE — Plan of Care (Signed)

## 2023-10-05 NOTE — Progress Notes (Signed)
 PROGRESS NOTE    Lawrence [name]  ZOX:[id number] DOB: [birth year] DOA: 10/02/2023 PCP: [name], PA-C   Brief Narrative: Lawrence [name] is a 68 y.o. male with a history of diabetes mellitus type 2, chronic diastolic heart failure, COPD, severe aortic stenosis s/p TAVR, seizure disorder, depression, anxiety, diastolic heart failure, morbid obesity.  Patient presented secondary to hypoglycemia with recurrent episodes.   Assessment and Plan:  Recurrent hypoglycemia In setting of insulin use for diabetes mellitus. Patient has recently had an increase in basal insulin by 5 units per history. Patient received D50 and home [place] held. Hypoglycemia resolved.  Diabetes mellitus type 2 Uncontrolled with hypoglycemia. Patient is on Tresiba, Prandin, Novolog and [place] as an outpatient, which was held. -Hold long acting insulin -Continue SSI  Acute metabolic encephalopathy Secondary to hypoglycemia. Resolved.  Paroxysmal atrial fibrillation Currently in sinus rhythm. Patient is on Eliquis and Toprol-XL as an outpatient. -Continue Eliquis and Toprol XL  SIRS No infectious source identified. Presentation seems likely related to hypoglycemia.  Chronic diastolic heart failure -Continue torsemide  History of seizures -Continue Keppra  Severe aortic stenosis s/p TAVR Noted.  Peripheral neuropathy -Continue Lyrica -Continue Voltaren gel  History of CVA Left hemiplegia Noted. Patient is on Coumadin for paroxysmal atrial fibrillation. -PT/OT eval  Anxiety Depression -Continue Remeron -Continue Buspar and Abilify  BPH -Continue prazosin  COPD -Continue Incruse Ellipta and Breo Ellipta  Morbid obesity, class III Estimated body mass index is 44.3 kg/m as calculated from the following:   Height as of this encounter: 6\' 2"  (1.88 m).   Weight as of this encounter: 156.5 kg.   DVT prophylaxis: Eliquis Code Status:   Code Status: Limited: Do not attempt  resuscitation (DNR) -DNR-LIMITED -Do Not Intubate/DNI  Family Communication: None at bedside Disposition Plan: Discharge to SNF pending bed availability. Medically stable for discharge.   Consultants:  None  Procedures:  None  Antimicrobials: None    Subjective: No concerns at this time.  Objective: BP (!) 153/117 (BP Location: Right Arm)   Pulse 76   Temp 98.9 F (37.2 C)   Resp 18   Ht 6\' 2"  (1.88 m)   Wt (!) 156.5 kg   SpO2 94%   BMI 44.30 kg/m   Examination:  General exam: Appears calm and comfortable Respiratory system: Clear to auscultation. Respiratory effort normal. Cardiovascular system: S1 & S2 heard, RRR. No murmurs. Gastrointestinal system: Abdomen is nondistended, soft and nontender.  Normal bowel sounds heard. Central nervous system: Alert and oriented. No focal neurological deficits. Musculoskeletal: Non-pitting bilateral LE edema. No calf tenderness Psychiatry: Judgement and insight appear normal. Mood & affect appropriate.    Data Reviewed: I have personally reviewed following labs and imaging studies  CBC Lab Results  Component Value Date   WBC 10.4 10/03/2023   RBC 4.61 10/03/2023   HGB 13.8 10/03/2023   HCT 41.8 10/03/2023   MCV 90.7 10/03/2023   MCH 29.9 10/03/2023   PLT 266 10/03/2023   MCHC 33.0 10/03/2023   RDW 15.1 10/03/2023   LYMPHSABS 1.7 10/03/2023   MONOABS 0.8 10/03/2023   EOSABS 0.4 10/03/2023   BASOSABS 0.0 10/03/2023     Last metabolic panel Lab Results  Component Value Date   NA 137 10/03/2023   K 4.0 10/03/2023   CL 104 10/03/2023   CO2 27 10/03/2023   BUN 17 10/03/2023   CREATININE 1.16 10/03/2023   GLUCOSE 103 (H) 10/03/2023   GFRNONAA >60 10/03/2023   GFRAA 77 05/20/2020  CALCIUM 8.2 (L) 10/03/2023   PHOS 3.1 07/23/2023   PROT 6.2 (L) 10/03/2023   ALBUMIN 3.4 (L) 10/03/2023   LABGLOB 2.8 02/08/2023   BILITOT 0.6 10/03/2023   ALKPHOS 35 (L) 10/03/2023   AST 19 10/03/2023   ALT 13 10/03/2023    ANIONGAP 6 10/03/2023    GFR: Estimated Creatinine Clearance: 99.1 mL/min (by C-G formula based on SCr of 1.16 mg/dL).  Recent Results (from the past 240 hours)  Resp panel by RT-PCR (RSV, Flu A&B, Covid) Anterior Nasal Swab     Status: None   Collection Time: 10/02/23  6:50 PM   Specimen: Anterior Nasal Swab  Result Value Ref Range Status   SARS Coronavirus 2 by RT PCR NEGATIVE NEGATIVE Final   Influenza A by PCR NEGATIVE NEGATIVE Final   Influenza B by PCR NEGATIVE NEGATIVE Final    Comment: (NOTE) The Xpert Xpress SARS-CoV-2/FLU/RSV plus assay is intended as an aid in the diagnosis of influenza from Nasopharyngeal swab specimens and should not be used as a sole basis for treatment. Nasal washings and aspirates are unacceptable for Xpert Xpress SARS-CoV-2/FLU/RSV testing.  Fact Sheet for Patients: BloggerCourse.com  Fact Sheet for Healthcare Providers: SeriousBroker.it  This test is not yet approved or cleared by the [place] FDA and has been authorized for detection and/or diagnosis of SARS-CoV-2 by FDA under an Emergency Use Authorization (EUA). This EUA will remain in effect (meaning this test can be used) for the duration of the COVID-19 declaration under Section 564(b)(1) of the Act, 21 U.S.C. section 360bbb-3(b)(1), unless the authorization is terminated or revoked.     Resp Syncytial Virus by PCR NEGATIVE NEGATIVE Final    Comment: (NOTE) Fact Sheet for Patients: BloggerCourse.com  Fact Sheet for Healthcare Providers: SeriousBroker.it  This test is not yet approved or cleared by the [place] FDA and has been authorized for detection and/or diagnosis of SARS-CoV-2 by FDA under an Emergency Use Authorization (EUA). This EUA will remain in effect (meaning this test can be used) for the duration of the COVID-19 declaration under Section 564(b)(1) of the Act,  21 U.S.C. section 360bbb-3(b)(1), unless the authorization is terminated or revoked.  Performed at [facility], [address] [place], [place] [address]       Radiology Studies: No results found.     LOS: 0 days    Jacquelin Hawking, MD [facility] 10/05/2023, 1:09 PM   If 7PM-7AM, please contact night-coverage [email or web address]

## 2023-10-05 NOTE — [place] FL2 (Signed)
 [place] MEDICAID FL2 LEVEL OF CARE FORM     IDENTIFICATION  Patient Name: Lawrence [name] Birthdate: 17-Aug-1955 Sex: male Admission Date (Current Location): 10/02/2023  [facility] and [place] Number:  Producer, television/film/video and Address:  [facility], [address], [place], [place] [address]      Provider Number: [id number]  Attending Physician Name and Address:  Narda Bonds, MD  Relative Name and Phone Number:       Current Level of Care: Hospital Recommended Level of Care: Skilled Nursing Facility Prior Approval Number:    Date Approved/Denied:   PASRR Number: PASRR under review  Discharge Plan: SNF    Current Diagnoses: Patient Active Problem List   Diagnosis Date Noted   Hypoglycemia 10/03/2023   SIRS (systemic inflammatory response syndrome) (HCC) 10/03/2023   History of COPD 10/03/2023   History of seizures 10/03/2023   Multiple falls 07/21/2023   Fall 07/15/2023   Acute metabolic encephalopathy 07/14/2023   Obesity, Class III, BMI 40-49.9 (morbid obesity) (HCC) 07/14/2023   History of CVA with residual deficit 07/14/2023   Fall at home, initial encounter 07/14/2023   Gout 07/12/2023   Constipation 07/09/2023   Polyneuropathy 07/04/2023   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   Acute kidney injury superimposed on stage 3a chronic kidney disease (HCC) 07/03/2023   Orthostatic hypotension 07/03/2023   Stage 3a chronic kidney disease (HCC) 06/30/2023   Acute stroke due to occlusion of left cerebellar artery (HCC) 06/28/2023   Type 2 diabetes mellitus with diabetic polyneuropathy, with long-term current use of insulin (HCC) 06/28/2023   CVA (cerebral vascular accident) (HCC) 06/25/2023   Atrial fibrillation, chronic (HCC) 06/25/2023   Acute CVA (cerebrovascular accident) (HCC) 06/25/2023   S/P TAVR (transcatheter aortic valve replacement) 02/07/2023   Acute respiratory failure with hypoxia and hypercapnia (HCC)  01/23/2023   Nail, injury by, initial encounter 12/09/2022   Heart failure (HCC) 10/27/2022   Gout attack 10/27/2022   CHF (congestive heart failure) (HCC) 10/26/2022   Right sided weakness 05/26/2022   Dysarthria 05/25/2022   Ischemic stroke (HCC) 02/25/2022   Hemiparesis affecting left side as late effect of cerebrovascular accident (CVA) (HCC) 02/25/2022   Durable power of attorney for healthcare exists  02/25/2022   Depression 08/07/2021   Paronychia of great toe, left 07/07/2021   Stroke-like symptoms 04/27/2021   Ambulatory dysfunction 04/27/2021   Left-sided weakness 02/09/2021   Atrial fibrillation with rapid ventricular response (HCC) 02/08/2021   AF (paroxysmal atrial fibrillation) (HCC) 01/25/2021   COPD (chronic obstructive pulmonary disease) (HCC) 01/25/2021   Aortic stenosis 01/21/2021   Acquired dilation of ascending aorta and aortic root (HCC) 01/21/2021   Coronary artery disease due to lipid rich plaque    SVT (supraventricular tachycardia) (HCC)    Seizure disorder (HCC)    Primary hypertension 10/17/2020   Hypokalemia 10/17/2020   Cellulitis of right leg 12/04/2019   Pain due to onychomycosis of toenails of both feet 09/11/2019   Diabetic neuropathy (HCC) 09/11/2019   DM2 (diabetes mellitus, type 2) (HCC) 09/11/2019   Sinus tachycardia 03/07/2019   Chronic venous stasis 03/07/2019   History of anemia 12/26/2018   Anemia 08/24/2018   History of colon polyps 08/24/2018   Do not resuscitate status 08/24/2018   Chronic diastolic CHF (congestive heart failure) (HCC) 08/18/2018   Coronary artery calcification seen on CAT scan    Pure hypercholesterolemia    Chest pain    Shortness of breath    Right  foot pain 01/13/2018   Acute on chronic diastolic CHF (congestive heart failure) (HCC) 01/13/2018   Type 2 diabetes mellitus with obesity (HCC) 01/13/2018   Lower extremity cellulitis 11/07/2017   Primary osteoarthritis, left shoulder 03/05/2017   BPH without  obstruction/lower urinary tract symptoms 02/22/2017   Hematuria 02/22/2017   Peripheral neuropathy 02/22/2017   Physical deconditioning 02/22/2017   Class 3 obesity    PTSD (post-traumatic stress disorder)    Anasarca 01/31/2017   Bilateral lower extremity edema 01/31/2017   OSA (obstructive sleep apnea)     Orientation RESPIRATION BLADDER Height & Weight     Self, Time, Situation, Place  Normal Incontinent Weight: (!) 345 lb (156.5 kg) Height:  6\' 2"  (188 cm)  BEHAVIORAL SYMPTOMS/MOOD NEUROLOGICAL BOWEL NUTRITION STATUS      Continent Diet (Please see discharge summary)  AMBULATORY STATUS COMMUNICATION OF NEEDS Skin   Extensive Assist Verbally Other (Comment) (Abrasion,Leg,Bil.,Wound/Incision LDAs)                       Personal Care Assistance Level of Assistance  Bathing, Feeding, Dressing Bathing Assistance: Maximum assistance Feeding assistance: Limited assistance Dressing Assistance: Maximum assistance     Functional Limitations Info  Sight, Hearing, Speech Sight Info: Adequate Hearing Info: Adequate Speech Info: Adequate    SPECIAL CARE FACTORS FREQUENCY  PT (By licensed PT), OT (By licensed OT)     PT Frequency: 5x min weekly OT Frequency: 5x min weekly            Contractures Contractures Info: Not present    Additional Factors Info  Code Status, Allergies, Psychotropic, Insulin Sliding Scale Code Status Info: DNR Allergies Info: Vancomycin,Niacin And Related,Tubersol (tuberculin, Ppd),Doxycycline Psychotropic Info: ARIPiprazole (ABILIFY) tablet 5 mg daily at bedtime,busPIRone (BUSPAR) tablet 10 mg 3 times daily,mirtazapine (REMERON) tablet 15 mg daily at bedtime,pregabalin (LYRICA) capsule 150 mg 3 times daily,  venlafaxine XR (EFFEXOR-XR) 24 hr capsule 150 mg daily with breakfast,  levETIRAcetam (KEPPRA) tablet 500 mg 2 times daily Insulin Sliding Scale Info: insulin aspart (novoLOG) injection 0-6 Units 3 times daily with meals       Current  Medications (10/05/2023):  This is the current hospital active medication list Current Facility-Administered Medications  Medication Dose Route Frequency Provider Last Rate Last Admin   acetaminophen (TYLENOL) tablet 1,000 mg  1,000 mg Oral Q6H PRN Ernie Avena, MD   1,000 mg at 10/04/23 1700   albuterol (PROVENTIL) (2.5 MG/3ML) 0.083% nebulizer solution 2.5 mg  2.5 mg Nebulization Q4H PRN Howerter, Justin B, DO       apixaban (ELIQUIS) tablet 5 mg  5 mg Oral BID Howerter, Justin B, DO   5 mg at 10/05/23 0859   ARIPiprazole (ABILIFY) tablet 5 mg  5 mg Oral QHS Berton Mount I, MD   5 mg at 10/04/23 2152   atorvastatin (LIPITOR) tablet 40 mg  40 mg Oral QHS Howerter, Justin B, DO   40 mg at 10/04/23 2151   busPIRone (BUSPAR) tablet 10 mg  10 mg Oral TID Narda Bonds, MD   10 mg at 10/05/23 0900   cyanocobalamin (VITAMIN B12) tablet 1,000 mcg  1,000 mcg Oral Daily Berton Mount I, MD   1,000 mcg at 10/05/23 0900   dextrose 50 % solution 50 mL  1 ampule Intravenous Q1H PRN Howerter, Justin B, DO       diclofenac Sodium (VOLTAREN) 1 % topical gel 4 g  4 g Topical QID Narda Bonds, MD  4 g at 10/05/23 1301   ferrous sulfate tablet 324 mg  324 mg Oral Q breakfast Berton Mount I, MD   324 mg at 10/05/23 0901   fluticasone furoate-vilanterol (BREO ELLIPTA) 200-25 MCG/ACT 1 puff  1 puff Inhalation Daily Howerter, Justin B, DO   1 puff at 10/05/23 0804   And   umeclidinium bromide (INCRUSE ELLIPTA) 62.5 MCG/ACT 1 puff  1 puff Inhalation Daily Howerter, Justin B, DO   1 puff at 10/05/23 1610   hydrALAZINE (APRESOLINE) tablet 25 mg  25 mg Oral Q6H PRN Narda Bonds, MD       HYDROcodone-acetaminophen (NORCO/VICODIN) 5-325 MG per tablet 1 tablet  1 tablet Oral Q4H PRN Berton Mount I, MD   1 tablet at 10/05/23 1259   insulin aspart (novoLOG) injection 0-6 Units  0-6 Units Subcutaneous TID WC Howerter, Justin B, DO   1 Units at 10/05/23 1259   lacosamide (VIMPAT) tablet 150 mg  150 mg  Oral BID Berton Mount I, MD   150 mg at 10/05/23 0859   levETIRAcetam (KEPPRA) tablet 500 mg  500 mg Oral BID Howerter, Justin B, DO   500 mg at 10/05/23 0900   melatonin tablet 3 mg  3 mg Oral QHS PRN Howerter, Justin B, DO   3 mg at 10/05/23 0157   methocarbamol (ROBAXIN) tablet 500 mg  500 mg Oral Q8H PRN Narda Bonds, MD   500 mg at 10/04/23 1925   metoprolol succinate (TOPROL-XL) 24 hr tablet 50 mg  50 mg Oral Daily Narda Bonds, MD   50 mg at 10/05/23 0901   mirtazapine (REMERON) tablet 15 mg  15 mg Oral QHS Berton Mount I, MD   15 mg at 10/04/23 2151   naloxone [facility]) injection 0.4 mg  0.4 mg Intravenous PRN Howerter, Justin B, DO       ondansetron (ZOFRAN) injection 4 mg  4 mg Intravenous Q6H PRN Howerter, Justin B, DO       polyethylene glycol (MIRALAX / GLYCOLAX) packet 17 g  17 g Oral Daily Berton Mount I, MD   17 g at 10/04/23 0842   pregabalin (LYRICA) capsule 150 mg  150 mg Oral TID Berton Mount I, MD   150 mg at 10/05/23 0859   sodium chloride flush (NS) 0.9 % injection 3 mL  3 mL Intravenous Q12H Ernie Avena, MD   3 mL at 10/05/23 0901   sodium chloride flush (NS) 0.9 % injection 3 mL  3 mL Intravenous PRN Ernie Avena, MD       torsemide [facility]) tablet 60 mg  60 mg Oral BID Howerter, Justin B, DO   60 mg at 10/05/23 0859   traZODone (DESYREL) tablet 200 mg  200 mg Oral QHS PRN Charm Rings, NP       venlafaxine XR (EFFEXOR-XR) 24 hr capsule 150 mg  150 mg Oral Q breakfast Mariel Craft, MD   150 mg at 10/05/23 1424     Discharge Medications: Please see discharge summary for a list of discharge medications.  Relevant Imaging Results:  Relevant Lab Results:   Additional Information [id number]  Delilah Shan, LCSWA

## 2023-10-05 NOTE — TOC Initial Note (Signed)
 Transition of Care [facility]) - Initial/Assessment Note    Patient Details  Name: Lawrence [name] MRN: [id number] Date of Birth: 09/12/1955  [facility] CM/SW Contact:    Eduard Roux, LCSW Phone Number: 10/05/2023, 1:56 PM  Clinical Narrative:                  CSW met with patient at bedside. CSW introduced self and explained role. Patient states he lives in the home w/significant other. He reports he was at a SNF in December. CSW explained the SNF process and co-pay status. CSW explained hopefully, Medicaid will cover co-payments. Patient states he is agreeable to short term rehab at [facility].   TOC will provide bed offers once available  TOC will continue to follow and assist with discharge planning.   Expected Discharge Plan: Skilled Nursing Facility Barriers to Discharge: English as a second language teacher, SNF Pending bed offer   Patient Goals and CMS Choice            Expected Discharge Plan and Services In-house Referral: Clinical Social Work     Living arrangements for the past 2 months: Apartment                           HH Arranged: Charity fundraiser, Nurse's Aide HH Agency: [facility] Date HH Agency Contacted: 10/04/23 Time HH Agency Contacted: 1100 Representative spoke with at [facility] Agency: Adele Dan  Prior Living Arrangements/Services Living arrangements for the past 2 months: Apartment Lives with:: Self, Significant Other Patient language and need for interpreter reviewed:: No        Need for Family Participation in Patient Care: Yes (Comment)     Criminal Activity/Legal Involvement Pertinent to Current Situation/Hospitalization: No - Comment as needed  Activities of Daily Living   ADL Screening (condition at time of admission) Independently performs ADLs?: No Does the patient have a NEW difficulty with bathing/dressing/toileting/self-feeding that is expected to last >3 days?: No (needs assist) Does the patient have a NEW difficulty with  getting in/out of bed, walking, or climbing stairs that is expected to last >3 days?: No (needs assist baseline up with walker/rollater) Does the patient have a NEW difficulty with communication that is expected to last >3 days?: No Is the patient deaf or have difficulty hearing?: No Does the patient have difficulty seeing, even when wearing glasses/contacts?: No Does the patient have difficulty concentrating, remembering, or making decisions?: Yes  Permission Sought/Granted Permission sought to share information with : Family Supports Permission granted to share information with : Yes, Verbal Permission Granted  Share Information with NAME: Janett Ayott  Permission granted to share info w AGENCY: SNFs  Permission granted to share info w Relationship: sig/other  Permission granted to share info w Contact Information: [phone]  Emotional Assessment Appearance:: Appears stated age Attitude/Demeanor/Rapport: Engaged, Self-Confident Affect (typically observed): Accepting, Appropriate Orientation: : Oriented to Self, Oriented to Place, Oriented to  Time, Oriented to Situation Alcohol / Substance Use: Not Applicable Psych Involvement: No (comment)  Admission diagnosis:  Hypoglycemia [E16.2] SIRS (systemic inflammatory response syndrome) (HCC) [R65.10] Altered mental status, unspecified altered mental status type [R41.82] Patient Active Problem List   Diagnosis Date Noted   Hypoglycemia 10/03/2023   SIRS (systemic inflammatory response syndrome) (HCC) 10/03/2023   History of COPD 10/03/2023   History of seizures 10/03/2023   Multiple falls 07/21/2023   Fall 07/15/2023   Acute metabolic encephalopathy 07/14/2023   Obesity, Class III, BMI 40-49.9 (morbid obesity) (  HCC) 07/14/2023   History of CVA with residual deficit 07/14/2023   Fall at home, initial encounter 07/14/2023   Gout 07/12/2023   Constipation 07/09/2023   Polyneuropathy 07/04/2023   Cognitive and neurobehavioral  dysfunction 07/04/2023   Acute kidney injury superimposed on stage 3a chronic kidney disease (HCC) 07/03/2023   Orthostatic hypotension 07/03/2023   Stage 3a chronic kidney disease (HCC) 06/30/2023   Acute stroke due to occlusion of left cerebellar artery (HCC) 06/28/2023   Type 2 diabetes mellitus with diabetic polyneuropathy, with long-term current use of insulin (HCC) 06/28/2023   CVA (cerebral vascular accident) (HCC) 06/25/2023   Atrial fibrillation, chronic (HCC) 06/25/2023   Acute CVA (cerebrovascular accident) (HCC) 06/25/2023   S/P TAVR (transcatheter aortic valve replacement) 02/07/2023   Acute respiratory failure with hypoxia and hypercapnia (HCC) 01/23/2023   Nail, injury by, initial encounter 12/09/2022   Heart failure (HCC) 10/27/2022   Gout attack 10/27/2022   CHF (congestive heart failure) (HCC) 10/26/2022   Right sided weakness 05/26/2022   Dysarthria 05/25/2022   Ischemic stroke (HCC) 02/25/2022   Hemiparesis affecting left side as late effect of cerebrovascular accident (CVA) (HCC) 02/25/2022   Durable power of attorney for healthcare exists  02/25/2022   Depression 08/07/2021   Paronychia of great toe, left 07/07/2021   Stroke-like symptoms 04/27/2021   Ambulatory dysfunction 04/27/2021   Left-sided weakness 02/09/2021   Atrial fibrillation with rapid ventricular response (HCC) 02/08/2021   AF (paroxysmal atrial fibrillation) (HCC) 01/25/2021   COPD (chronic obstructive pulmonary disease) (HCC) 01/25/2021   Aortic stenosis 01/21/2021   Acquired dilation of ascending aorta and aortic root (HCC) 01/21/2021   Coronary artery disease due to lipid rich plaque    SVT (supraventricular tachycardia) (HCC)    Seizure disorder (HCC)    Primary hypertension 10/17/2020   Hypokalemia 10/17/2020   Cellulitis of right leg 12/04/2019   Pain due to onychomycosis of toenails of both feet 09/11/2019   Diabetic neuropathy (HCC) 09/11/2019   DM2 (diabetes mellitus, type 2) (HCC)  09/11/2019   Sinus tachycardia 03/07/2019   Chronic venous stasis 03/07/2019   History of anemia 12/26/2018   Anemia 08/24/2018   History of colon polyps 08/24/2018   Do not resuscitate status 08/24/2018   Chronic diastolic CHF (congestive heart failure) (HCC) 08/18/2018   Coronary artery calcification seen on CAT scan    Pure hypercholesterolemia    Chest pain    Shortness of breath    Right foot pain 01/13/2018   Acute on chronic diastolic CHF (congestive heart failure) (HCC) 01/13/2018   Type 2 diabetes mellitus with obesity (HCC) 01/13/2018   Lower extremity cellulitis 11/07/2017   Primary osteoarthritis, left shoulder 03/05/2017   BPH without obstruction/lower urinary tract symptoms 02/22/2017   Hematuria 02/22/2017   Peripheral neuropathy 02/22/2017   Physical deconditioning 02/22/2017   Class 3 obesity    PTSD (post-traumatic stress disorder)    Anasarca 01/31/2017   Bilateral lower extremity edema 01/31/2017   OSA (obstructive sleep apnea)    PCP:  [name], PA-C Pharmacy:   [id number] [place], [place] - [address] [place] [address] Phone: [phone] Fax: [phone]  [id number] [place], [place] - [address] AT [facility] & [address] [place] [address] [place] [address] Phone: [phone] Fax: [phone]     Social Drivers of Health (SDOH) Social History: SDOH Screenings   Food Insecurity: No Food Insecurity (10/03/2023)  Housing:  Low Risk  (10/03/2023)  Transportation Needs: Unmet Transportation Needs (10/03/2023)  Utilities: Not At Risk (10/03/2023)  Alcohol Screen: Low Risk  (06/08/2021)  Financial Resource Strain: Medium Risk (12/06/2021)   Received from [facility]  Physical Activity: Unknown (12/06/2021)   Received from [facility]  Social Connections: Socially Isolated (10/03/2023)  Stress: No Stress Concern Present (01/21/2022)    Received from [facility]  Recent Concern: Stress - Stress Concern Present (11/19/2021)   Received from [facility]  Tobacco Use: Medium Risk (10/03/2023)   SDOH Interventions: Transportation Interventions: Inpatient TOC   Readmission Risk Interventions    07/24/2023   11:56 AM 05/08/2023    4:37 PM 01/25/2023    4:23 PM  Readmission Risk Prevention Plan  Transportation Screening Complete Complete Complete  PCP or Specialist Appt within 3-5 Days   Complete  HRI or Home Care Consult   Complete  Social Work Consult for Recovery Care Planning/Counseling   Complete  Palliative Care Screening   Not Applicable  Medication Review Oceanographer) Complete Complete Referral to Pharmacy  PCP or Specialist appointment within 3-5 days of discharge Complete Complete   HRI or Home Care Consult Complete Complete   SW Recovery Care/Counseling Consult Complete Complete   Palliative Care Screening Not Applicable Complete   Skilled Nursing Facility Complete Not Applicable

## 2023-10-05 NOTE — Progress Notes (Addendum)
 RE: Lawrence [name]. Stivers  Date of Birth: February 14, 1956  Date: 10/05/2023    To Whom It May Concern:   Please be advised that the above-named patient will require a short-term nursing home stay - anticipated 30 days or less for rehabilitation and strengthening. The plan is for return home.

## 2023-10-05 NOTE — TOC Progression Note (Signed)
 Transition of Care [facility]) - Progression Note    Patient Details  Name: Lawrence [name] MRN: [id number] Date of Birth: 1956/03/01  Transition [facility] Care [facility]) CM/SW Contact  Delilah Shan, LCSWA Phone Number: 10/05/2023, 4:31 PM  Clinical Narrative:     CSW submitted requested clinicals to [facility] must for review. Patients passr pending. CSW faxed patient out for SNF. CSW will continue to follow.  Expected Discharge Plan: Skilled Nursing Facility Barriers to Discharge: English as a second language teacher, SNF Pending bed offer  Expected Discharge Plan and Services In-house Referral: Clinical Social Work     Living arrangements for the past 2 months: Apartment                           HH Arranged: Charity fundraiser, Nurse's Aide HH Agency: [facility] Date HH Agency Contacted: 10/04/23 Time HH Agency Contacted: 1100 Representative spoke with at [facility] Agency: Adele Dan   Social Determinants of Health (SDOH) Interventions SDOH Screenings   Food Insecurity: No Food Insecurity (10/03/2023)  Housing: Low Risk  (10/03/2023)  Transportation Needs: Unmet Transportation Needs (10/03/2023)  Utilities: Not At Risk (10/03/2023)  Alcohol Screen: Low Risk  (06/08/2021)  Financial Resource Strain: Medium Risk (12/06/2021)   Received from [facility]  Physical Activity: Unknown (12/06/2021)   [facility] from [facility]  Social Connections: Socially Isolated (10/03/2023)  Stress: No Stress Concern Present (01/21/2022)   Received from [facility]  Recent Concern: Stress - Stress Concern Present (11/19/2021)   Received from [facility]  Tobacco Use: Medium Risk (10/03/2023)    Readmission Risk Interventions    07/24/2023   11:56 AM 05/08/2023    4:37 PM 01/25/2023    4:23 PM  Readmission Risk Prevention Plan  Transportation Screening Complete Complete Complete  PCP or Specialist Appt within 3-5 Days   Complete  HRI or Home Care Consult   Complete   Social Work Consult for Recovery Care Planning/Counseling   Complete  Palliative Care Screening   Not Applicable  Medication Review Oceanographer) Complete Complete Referral to Pharmacy  PCP or Specialist appointment within 3-5 days of discharge Complete Complete   HRI or Home Care Consult Complete Complete   SW Recovery Care/Counseling Consult Complete Complete   Palliative Care Screening Not Applicable Complete   Skilled Nursing Facility Complete Not Applicable

## 2023-10-06 DIAGNOSIS — E162 Hypoglycemia, unspecified: Secondary | ICD-10-CM | POA: Diagnosis not present

## 2023-10-06 LAB — GLUCOSE, CAPILLARY
Glucose-Capillary: 100 mg/dL — ABNORMAL HIGH (ref 70–99)
Glucose-Capillary: 136 mg/dL — ABNORMAL HIGH (ref 70–99)
Glucose-Capillary: 142 mg/dL — ABNORMAL HIGH (ref 70–99)
Glucose-Capillary: 165 mg/dL — ABNORMAL HIGH (ref 70–99)
Glucose-Capillary: 230 mg/dL — ABNORMAL HIGH (ref 70–99)

## 2023-10-06 NOTE — Progress Notes (Signed)
 Physical Therapy Treatment Patient Details Name: Lawrence [name] MRN: [id number] DOB: 1955-10-08 Today's Date: 10/06/2023   History of Present Illness Pt is a 68 y/o M admitted on 10/02/23 after presenting with AMS & lethargy. Pt is being treated for recurrent hypoglycemia. PMH: DM2, chronic diastolic heart failure, COPD, severe aortic stenosis s/p TAVR, seizure disorder, depression, anxiety, diastolic heart failure, morbid obesity, CVA with residual L sided weakness, Nov '24 L cerebellar CVA, spinal stimulator    PT Comments  Pt seen for PT tx with pt agreeable. Pt is able to complete bed mobility with mod I. Pt attempts STS with max assist +1, pt able to clear buttocks but unable to come to upright standing. Pt performs lateral scoot bed>drop arm recliner with min assist with good technique. Pt performs strengthening exercises. Will continue to follow pt acutely to progress mobility as able.    If plan is discharge home, recommend the following: Two people to help with walking and/or transfers;Two people to help with bathing/dressing/bathroom   Can travel by private vehicle     No  Equipment Recommendations  Other (comment) (defer to next venue)    Recommendations for Other Services       Precautions / Restrictions Precautions Precautions: Fall Restrictions Weight Bearing Restrictions Per Provider Order: No     Mobility  Bed Mobility Overal bed mobility: Needs Assistance Bed Mobility: Supine to Sit     Supine to sit: Modified independent (Device/Increase time), HOB elevated, Used rails (exit R side of bed with HOB elevated)          Transfers Overall transfer level: Needs assistance   Transfers: Sit to/from Stand, Bed to chair/wheelchair/BSC            Lateral/Scoot Transfers: Min assist (bed>drop arm recliner on R) General transfer comment: Pt attempted STS from elevated EOB with cuing re: hand placement to push to standing, pt attempts to stand with pt able to  clear buttocks but unable to upright trunk or shift pelvis anteriorly so assisted to sitting back on EOB. Pt completes lateral scoot bed>drop arm recliner with good awareness of safety (pt tells PT to ensure brakes are locked), & good ability to scoot using BUE/BLE.    Ambulation/Gait                   Stairs             Wheelchair Mobility     Tilt Bed    Modified Rankin (Stroke Patients Only)       Balance Overall balance assessment: Needs assistance Sitting-balance support: Feet supported, Bilateral upper extremity supported Sitting balance-Leahy Scale: Good                                      Communication Communication Communication: No apparent difficulties  Cognition Arousal: Alert Behavior During Therapy: WFL for tasks assessed/performed   PT - Cognitive impairments: No apparent impairments                           Following commands impaired: Only follows one step commands consistently    Cueing    Exercises General Exercises - Lower Extremity Gluteal Sets: AROM, Strengthening, 10 reps Hip Flexion/Marching: AROM, Seated, Strengthening, Right, Left, 20 reps    General Comments        Pertinent Vitals/Pain Pain Assessment Pain Assessment: 0-10 Pain  Score: 5  Pain Location: bil feet Pain Descriptors / Indicators: Discomfort Pain Intervention(s): RN gave pain meds during session, Monitored during session    Home Living                          Prior Function            PT Goals (current goals can now be found in the care plan section) Acute Rehab PT Goals Patient Stated Goal: get better PT Goal Formulation: With patient Time For Goal Achievement: 10/18/23 Potential to Achieve Goals: Fair Progress towards PT goals: Progressing toward goals    Frequency    Min 1X/week      PT Plan      Co-evaluation              AM-PAC PT "6 Clicks" Mobility   Outcome Measure  Help needed  turning from your back to your side while in a flat bed without using bedrails?: A Little Help needed moving from lying on your back to sitting on the side of a flat bed without using bedrails?: A Lot Help needed moving to and from a bed to a chair (including a wheelchair)?: Total Help needed standing up from a chair using your arms (e.g., wheelchair or bedside chair)?: Total Help needed to walk in hospital room?: Total Help needed climbing 3-5 steps with a railing? : Total 6 Click Score: 9    End of Session Equipment Utilized During Treatment: Gait belt Activity Tolerance: Patient tolerated treatment well;Patient limited by fatigue Patient left: in chair;with call bell/phone within reach;with chair alarm set Nurse Communication: Mobility status PT Visit Diagnosis: Muscle weakness (generalized) (M62.81);Other abnormalities of gait and mobility (R26.89);Difficulty in walking, not elsewhere classified (R26.2);Pain Pain - Right/Left:  (bilateral) Pain - part of body: Ankle and joints of foot     Time: 7829-5621 PT Time Calculation (min) (ACUTE ONLY): 23 min  Charges:    $Therapeutic Activity: 23-37 mins PT General Charges $$ ACUTE PT VISIT: 1 Visit                     Aleda Grana, PT, DPT 10/06/23, 2:16 PM   [name] 10/06/2023, 2:15 PM

## 2023-10-06 NOTE — Progress Notes (Signed)
 Pt refusing cpap.

## 2023-10-06 NOTE — TOC Progression Note (Signed)
 Transition of Care [facility]) - Progression Note    Patient Details  Name: Lawrence [name] MRN: [id number] Date of Birth: 02-12-1956  Transition [facility]) CM/SW Contact  Eduard Roux, [place] Phone Number: 10/06/2023, 5:04 PM  Clinical Narrative:     Cherlyn Roberts still pending   Started insurance auth for [employer], reference # [id number].   Informed [facility]/Tammy- Kristin Bruins has been started and patient will need bariatric bed and equipment.  TOC will continue to follow and assist with discharge planning.  Antony Blackbird, MSW, LCSW Clinical Social Worker       Expected Discharge Plan: Skilled Nursing Facility Barriers to Discharge: English as a second language teacher, SNF Pending bed offer, Awaiting State Approval Financial controller)  Expected Discharge Plan and Services In-house Referral: Clinical Social Work     Living arrangements for the past 2 months: Apartment                           HH Arranged: Charity fundraiser, Nurse's Aide [facility] Date HH Agency Contacted: 10/04/23 Time HH Agency Contacted: 1100 Representative spoke with at [facility] Agency: Adele Dan   Social Determinants of Health (SDOH) Interventions SDOH Screenings   Food Insecurity: No Food Insecurity (10/03/2023)  Housing: Low Risk  (10/03/2023)  Transportation Needs: Unmet Transportation Needs (10/03/2023)  Utilities: Not At Risk (10/03/2023)  Alcohol Screen: Low Risk  (06/08/2021)  Financial Resource Strain: Medium Risk (12/06/2021)   Received from [facility], [facility]  Physical Activity: Unknown (12/06/2021)   Received from [facility], [facility]  Social Connections: Socially Isolated (10/03/2023)  Stress: No Stress Concern Present (01/21/2022)   Received from [facility], [facility]  Recent Concern: Stress - Stress Concern Present (11/19/2021)   Received from [facility]  Tobacco Use: Medium Risk (10/03/2023)    Readmission Risk Interventions    07/24/2023   11:56 AM 05/08/2023     4:37 PM 01/25/2023    4:23 PM  Readmission Risk Prevention Plan  Transportation Screening Complete Complete Complete  PCP or Specialist Appt within 3-5 Days   Complete  HRI or Home Care Consult   Complete  Social Work Consult for Recovery Care Planning/Counseling   Complete  Palliative Care Screening   Not Applicable  Medication Review Oceanographer) Complete Complete Referral to Pharmacy  PCP or Specialist appointment within 3-5 days of discharge Complete Complete   HRI or Home Care Consult Complete Complete   SW Recovery Care/Counseling Consult Complete Complete   Palliative Care Screening Not Applicable Complete   Skilled Nursing Facility Complete Not Applicable

## 2023-10-06 NOTE — Progress Notes (Signed)
 PROGRESS NOTE    Lawrence [name]  AVW:[id number] DOB: February 11, 1956 DOA: 10/02/2023 PCP: [name], PA-C   Brief Narrative: Lawrence [name] is a 68 y.o. male with a history of diabetes mellitus type 2, chronic diastolic heart failure, COPD, severe aortic stenosis s/p TAVR, seizure disorder, depression, anxiety, diastolic heart failure, morbid obesity.  Patient presented secondary to hypoglycemia with recurrent episodes.   Assessment and Plan:  Recurrent hypoglycemia In setting of insulin use for diabetes mellitus. Patient has recently had an increase in basal insulin by 5 units per history. Patient received D50 and home [place] held. Hypoglycemia resolved.  Diabetes mellitus type 2 Uncontrolled with hypoglycemia. Patient is on Tresiba, Prandin, Novolog and [place] as an outpatient, which was held. -Hold long acting insulin -Continue SSI  Acute metabolic encephalopathy Secondary to hypoglycemia. Resolved.  Paroxysmal atrial fibrillation Currently in sinus rhythm. Patient is on Eliquis and Toprol-XL as an outpatient. -Continue Eliquis and Toprol XL  SIRS No infectious source identified. Presentation seems likely related to hypoglycemia.  Chronic diastolic heart failure -Continue torsemide  History of seizures -Continue Keppra  Severe aortic stenosis s/p TAVR Noted.  Peripheral neuropathy -Continue Lyrica -Continue Voltaren gel  History of CVA Left hemiplegia Noted. Patient is on Coumadin for paroxysmal atrial fibrillation. -PT/OT eval  Anxiety Depression -Continue Remeron -Continue Buspar and Abilify  BPH -Continue prazosin  COPD -Continue Incruse Ellipta and Breo Ellipta  Morbid obesity, class III Estimated body mass index is 44.33 kg/m as calculated from the following:   Height as of this encounter: 6\' 2"  (1.88 m).   Weight as of this encounter: 156.6 kg.   DVT prophylaxis: Eliquis Code Status:   Code Status: Limited: Do not attempt  resuscitation (DNR) -DNR-LIMITED -Do Not Intubate/DNI  Family Communication: None at bedside Disposition Plan: Discharge to SNF pending bed availability. Medically stable for discharge.   Consultants:  None  Procedures:  None  Antimicrobials: None    Subjective: No issues from overnight. States he did not get CPAP overnight. Per respiratory therapy note, patient refused, however.  Objective: BP 111/79   Pulse 85   Temp (!) 97.4 F (36.3 C) (Oral)   Resp 18   Ht 6\' 2"  (1.88 m)   Wt (!) 156.6 kg   SpO2 93%   BMI 44.33 kg/m   Examination:  General exam: Appears calm and comfortable   Data Reviewed: I have personally reviewed following labs and imaging studies  CBC Lab Results  Component Value Date   WBC 10.4 10/03/2023   RBC 4.61 10/03/2023   HGB 13.8 10/03/2023   HCT 41.8 10/03/2023   MCV 90.7 10/03/2023   MCH 29.9 10/03/2023   PLT 266 10/03/2023   MCHC 33.0 10/03/2023   RDW 15.1 10/03/2023   LYMPHSABS 1.7 10/03/2023   MONOABS 0.8 10/03/2023   EOSABS 0.4 10/03/2023   BASOSABS 0.0 10/03/2023     Last metabolic panel Lab Results  Component Value Date   NA 137 10/03/2023   K 4.0 10/03/2023   CL 104 10/03/2023   CO2 27 10/03/2023   BUN 17 10/03/2023   CREATININE 1.16 10/03/2023   GLUCOSE 103 (H) 10/03/2023   GFRNONAA >60 10/03/2023   GFRAA 77 05/20/2020   CALCIUM 8.2 (L) 10/03/2023   PHOS 3.1 07/23/2023   PROT 6.2 (L) 10/03/2023   ALBUMIN 3.4 (L) 10/03/2023   LABGLOB 2.8 02/08/2023   BILITOT 0.6 10/03/2023   ALKPHOS 35 (L) 10/03/2023   AST 19 10/03/2023   ALT 13 10/03/2023  ANIONGAP 6 10/03/2023    GFR: Estimated Creatinine Clearance: 99.2 mL/min (by C-G formula based on SCr of 1.16 mg/dL).  Recent Results (from the past 240 hours)  Resp panel by RT-PCR (RSV, Flu A&B, Covid) Anterior Nasal Swab     Status: None   Collection Time: 10/02/23  6:50 PM   Specimen: Anterior Nasal Swab  Result Value Ref Range Status   SARS Coronavirus 2 by  RT PCR NEGATIVE NEGATIVE Final   Influenza A by PCR NEGATIVE NEGATIVE Final   Influenza B by PCR NEGATIVE NEGATIVE Final    Comment: (NOTE) The Xpert Xpress SARS-CoV-2/FLU/RSV plus assay is intended as an aid in the diagnosis of influenza from Nasopharyngeal swab specimens and should not be used as a sole basis for treatment. Nasal washings and aspirates are unacceptable for Xpert Xpress SARS-CoV-2/FLU/RSV testing.  Fact Sheet for Patients: BloggerCourse.com  Fact Sheet for Healthcare Providers: SeriousBroker.it  This test is not yet approved or cleared by the [place] FDA and has been authorized for detection and/or diagnosis of SARS-CoV-2 by FDA under an Emergency Use Authorization (EUA). This EUA will remain in effect (meaning this test can be used) for the duration of the COVID-19 declaration under Section 564(b)(1) of the Act, 21 U.S.C. section 360bbb-3(b)(1), unless the authorization is terminated or revoked.     Resp Syncytial Virus by PCR NEGATIVE NEGATIVE Final    Comment: (NOTE) Fact Sheet for Patients: BloggerCourse.com  Fact Sheet for Healthcare Providers: SeriousBroker.it  This test is not yet approved or cleared by the [place] FDA and has been authorized for detection and/or diagnosis of SARS-CoV-2 by FDA under an Emergency Use Authorization (EUA). This EUA will remain in effect (meaning this test can be used) for the duration of the COVID-19 declaration under Section 564(b)(1) of the Act, 21 U.S.C. section 360bbb-3(b)(1), unless the authorization is terminated or revoked.  Performed at [facility], [address], [place], [place] [address]       Radiology Studies: No results found.     LOS: 0 days    Jacquelin Hawking, MD [facility] 10/06/2023, 9:56 AM   If 7PM-7AM, please contact night-coverage [email or web address]

## 2023-10-06 NOTE — Plan of Care (Signed)
  Problem: Education: Goal: Ability to describe self-care measures that may prevent or decrease complications (Diabetes Survival Skills Education) will improve Outcome: Progressing   Problem: Coping: Goal: Ability to adjust to condition or change in health will improve Outcome: Progressing   Problem: Fluid Volume: Goal: Ability to maintain a balanced intake and output will improve Outcome: Progressing   Problem: Health Behavior/Discharge Planning: Goal: Ability to identify and utilize available resources and services will improve Outcome: Progressing Goal: Ability to manage health-related needs will improve Outcome: Progressing   Problem: Metabolic: Goal: Ability to maintain appropriate glucose levels will improve Outcome: Progressing   Problem: Nutritional: Goal: Maintenance of adequate nutrition will improve Outcome: Progressing Goal: Progress toward achieving an optimal weight will improve Outcome: Progressing   Problem: Skin Integrity: Goal: Risk for impaired skin integrity will decrease Outcome: Progressing   Problem: Tissue Perfusion: Goal: Adequacy of tissue perfusion will improve Outcome: Progressing   Problem: Education: Goal: Knowledge of General Education information will improve Description: Including pain rating scale, medication(s)/side effects and non-pharmacologic comfort measures Outcome: Progressing   Problem: Health Behavior/Discharge Planning: Goal: Ability to manage health-related needs will improve Outcome: Progressing   Problem: Clinical Measurements: Goal: Ability to maintain clinical measurements within normal limits will improve Outcome: Progressing Goal: Will remain free from infection Outcome: Progressing Goal: Diagnostic test results will improve Outcome: Progressing Goal: Respiratory complications will improve Outcome: Progressing Goal: Cardiovascular complication will be avoided Outcome: Progressing   Problem: Activity: Goal:  Risk for activity intolerance will decrease Outcome: Progressing   Problem: Nutrition: Goal: Adequate nutrition will be maintained Outcome: Progressing   Problem: Coping: Goal: Level of anxiety will decrease Outcome: Progressing   Problem: Elimination: Goal: Will not experience complications related to bowel motility Outcome: Progressing Goal: Will not experience complications related to urinary retention Outcome: Progressing   Problem: Pain Managment: Goal: General experience of comfort will improve and/or be controlled Outcome: Progressing   Problem: Safety: Goal: Ability to remain free from injury will improve Outcome: Progressing   Problem: Skin Integrity: Goal: Risk for impaired skin integrity will decrease Outcome: Progressing

## 2023-10-06 NOTE — TOC Progression Note (Signed)
 Transition of Care [facility]) - Progression Note    Patient Details  Name: Lawrence [name] MRN: [id number] Date of Birth: 1955/09/08  [facility]) CM/SW Contact  Eduard Roux, [place] Phone Number: 10/06/2023, 8:57 AM  Clinical Narrative:     PASRR still pending   Expected Discharge Plan: Skilled Nursing Facility Barriers to Discharge: Insurance Authorization, SNF Pending bed offer  Expected Discharge Plan and Services In-house Referral: Clinical Social Work     Living arrangements for the past 2 months: Apartment                           HH Arranged: Charity fundraiser, Nurse's Aide HH [facility]: [facility] Date HH Agency Contacted: 10/04/23 Time HH Agency Contacted: 1100 Representative spoke with at [facility] Agency: Adele Dan   Social Determinants of Health (SDOH) Interventions SDOH Screenings   Food Insecurity: No Food Insecurity (10/03/2023)  Housing: Low Risk  (10/03/2023)  Transportation Needs: Unmet Transportation Needs (10/03/2023)  Utilities: Not At Risk (10/03/2023)  Alcohol Screen: Low Risk  (06/08/2021)  Financial Resource Strain: Medium Risk (12/06/2021)   Received from [facility], [facility]  Physical Activity: Unknown (12/06/2021)   Received from [facility], [facility]  Social Connections: Socially Isolated (10/03/2023)  Stress: No Stress Concern Present (01/21/2022)   Received from [facility], [facility]  Recent Concern: Stress - Stress Concern Present (11/19/2021)   Received from [facility]  Tobacco Use: Medium Risk (10/03/2023)    Readmission Risk Interventions    07/24/2023   11:56 AM 05/08/2023    4:37 PM 01/25/2023    4:23 PM  Readmission Risk Prevention Plan  Transportation Screening Complete Complete Complete  PCP or Specialist Appt within 3-5 Days   Complete  HRI or Home Care Consult   Complete  Social Work Consult for Recovery Care Planning/Counseling   Complete  Palliative Care Screening   Not Applicable   Medication Review Oceanographer) Complete Complete Referral to Pharmacy  PCP or Specialist appointment within 3-5 days of discharge Complete Complete   HRI or Home Care Consult Complete Complete   SW Recovery Care/Counseling Consult Complete Complete   Palliative Care Screening Not Applicable Complete   Skilled Nursing Facility Complete Not Applicable

## 2023-10-06 NOTE — Progress Notes (Signed)
 Calorie Count Note Patient has good appetite and PO intake meeting 95% of his calorie needs and 87% of his protein needs. Labs and medications reviewed. No further nutrition interventions warranted at this time. RD contact information provided. If additional nutrition issues arise, please re-consult RD.   48 hour calorie count ordered.  Diet: Regular diet, thin liquids  Supplements: None   2/26:  Breakfast: 75% (570 kcal, 21 gm protein) Lunch: 50% (375 kcal, 17 gm protein) Dinner: 100% (770 kcal, 46 gm protein)  2/27:  Breakfast: 100% (890 kcal, 39 gm protein) Lunch: 100% (720 kcal, 21 gm protein) Dinner: 100% (677 kcal, 22 gm protein) Supplements:   48 Hour average total intake: 2001 kcal (95% of minimum estimated needs)  83 gm protein (87% of minimum estimated needs)  Estimated Nutritional Needs:  Kcal:  2100-2300kcal Protein:  95-110g Fluid:  >2L/day  INTERVENTION:  Discontinue calorie count  MVI w/ minerals Encourage protein intake    NUTRITION DIAGNOSIS:  Altered nutrition lab value related to chronic illness as evidenced by other (comment) (T2DM/hypoglycemia).   GOAL:  Patient will meet greater than or equal to 90% of their needs   Elliot Dally, RD Registered Dietitian  See Amion for more information

## 2023-10-06 NOTE — TOC Progression Note (Signed)
 Transition of Care [facility]) - Progression Note    Patient Details  Name: Lawrence [name] MRN: [id number] Date of Birth: [birth year]  Transition of Care [facility]) CM/SW Contact  Eduard Roux, [place] Phone Number: 10/06/2023, 11:21 AM  Clinical Narrative:     CSW met with patient  at bedside. CSW provide bed offers. CSW called patient's spouse as requested- no answer.   TOC will follow up with later today for SNF choice.  Antony Blackbird, MSW, LCSW Clinical Social Worker    Expected Discharge Plan: Skilled Nursing Facility Barriers to Discharge: English as a second language teacher, SNF Pending bed offer, Awaiting State Approval Financial controller)  Expected Discharge Plan and Services In-house Referral: Clinical Social Work     Living arrangements for the past 2 months: Apartment                           HH Arranged: Charity fundraiser, Nurse's Aide HH [facility] Date HH Agency Contacted: 10/04/23 Time HH Agency Contacted: 1100 Representative spoke with at [facility] Agency: Adele Dan   Social Determinants of Health (SDOH) Interventions SDOH Screenings   Food Insecurity: No Food Insecurity (10/03/2023)  Housing: Low Risk  (10/03/2023)  Transportation Needs: Unmet Transportation Needs (10/03/2023)  Utilities: Not At Risk (10/03/2023)  Alcohol Screen: Low Risk  (06/08/2021)  Financial Resource Strain: Medium Risk (12/06/2021)   Received from [facility], [facility]  Physical Activity: Unknown (12/06/2021)   Received from [facility], [facility]  Social Connections: Socially Isolated (10/03/2023)  Stress: No Stress Concern Present (01/21/2022)   Received from [facility], [facility]  Recent Concern: Stress - Stress Concern Present (11/19/2021)   Received from [facility]  Tobacco Use: Medium Risk (10/03/2023)    Readmission Risk Interventions    07/24/2023   11:56 AM 05/08/2023    4:37 PM 01/25/2023    4:23 PM  Readmission Risk Prevention Plan  Transportation Screening  Complete Complete Complete  PCP or Specialist Appt within 3-5 Days   Complete  HRI or Home Care Consult   Complete  Social Work Consult for Recovery Care Planning/Counseling   Complete  Palliative Care Screening   Not Applicable  Medication Review Oceanographer) Complete Complete Referral to Pharmacy  PCP or Specialist appointment within 3-5 days of discharge Complete Complete   HRI or Home Care Consult Complete Complete   SW Recovery Care/Counseling Consult Complete Complete   Palliative Care Screening Not Applicable Complete   Skilled Nursing Facility Complete Not Applicable

## 2023-10-07 DIAGNOSIS — I63542 Cerebral infarction due to unspecified occlusion or stenosis of left cerebellar artery: Secondary | ICD-10-CM | POA: Diagnosis not present

## 2023-10-07 DIAGNOSIS — I69811 Memory deficit following other cerebrovascular disease: Secondary | ICD-10-CM | POA: Diagnosis not present

## 2023-10-07 DIAGNOSIS — F4310 Post-traumatic stress disorder, unspecified: Secondary | ICD-10-CM | POA: Diagnosis not present

## 2023-10-07 DIAGNOSIS — M6281 Muscle weakness (generalized): Secondary | ICD-10-CM | POA: Diagnosis not present

## 2023-10-07 DIAGNOSIS — R262 Difficulty in walking, not elsewhere classified: Secondary | ICD-10-CM | POA: Diagnosis not present

## 2023-10-07 DIAGNOSIS — E162 Hypoglycemia, unspecified: Secondary | ICD-10-CM | POA: Diagnosis not present

## 2023-10-07 DIAGNOSIS — I5030 Unspecified diastolic (congestive) heart failure: Secondary | ICD-10-CM | POA: Diagnosis not present

## 2023-10-07 LAB — GLUCOSE, CAPILLARY
Glucose-Capillary: 146 mg/dL — ABNORMAL HIGH (ref 70–99)
Glucose-Capillary: 115 mg/dL — ABNORMAL HIGH (ref 70–99)
Glucose-Capillary: 131 mg/dL — ABNORMAL HIGH (ref 70–99)
Glucose-Capillary: 105 mg/dL — ABNORMAL HIGH (ref 70–99)

## 2023-10-07 NOTE — Plan of Care (Signed)

## 2023-10-07 NOTE — Progress Notes (Deleted)
SATURATION QUALIFICATIONS: (This note is used to comply with regulatory documentation for home oxygen)  Patient Saturations on Room Air at Rest = 93%  Patient Saturations on Room Air while Ambulating = 91%  Patient Saturations on 0 Liters of oxygen while Ambulating = 91%  Please briefly explain why patient needs home oxygen: 

## 2023-10-07 NOTE — Consult Note (Signed)
 [facility] Psychiatric Consult Follow up  Patient Name: .Lawrence [name]  MRN: [id number]  DOB: 06/01/1956  Consult Order details:  Orders (From admission, onward)     Start     Ordered   10/03/23 1553  IP CONSULT TO PSYCHIATRY       Ordering Provider: Barnetta Chapel, MD  Provider:  (Not yet assigned)  Question Answer Comment  Location [facility]   Reason for Consult? Psych consult      10/03/23 1552             Mode of Visit: In person    Psychiatry Consult Evaluation  Service Date: October 07, 2023 LOS:  LOS: 0 days  Chief Complaint hypoglycemia   Primary Psychiatric Diagnoses  Major depressive disorder, recurrent, moderate 2.  PTSD   Assessment  Lawrence [name] is a 68 y.o. male admitted: Medicallyfor 10/02/2023 11:31 AM for hypoglycemia. He carries the psychiatric diagnoses of PTSD, MDD and has a past medical history of  type 2 diabetes mellitus, chronic diastolic heart failure, COPD, severe aortic stenosis status post TAVR in July 2024, seizure disorder.   He meets criteria for PTSD based on flashbacks and nightmares over traumatic events.  Current outpatient psychotropic medications include Buspar, Remeron, Effexor, Trazodone and Abilify and historically he has had a positive response to these medications. He was compliant with medications prior to admission as evidenced by self-report. On initial examination, patient was pleasant, engaged easily in conversation, cried at times. Please see plan below for detailed recommendations.   Diagnoses:  Active Hospital problems: Principal Problem:   Hypoglycemia Active Problems:   Chronic diastolic CHF (congestive heart failure) (HCC)   DM2 (diabetes mellitus, type 2) (HCC)   Acute metabolic encephalopathy   SIRS (systemic inflammatory response syndrome) (HCC)   History of COPD   History of seizures   Altered mental status   MDD (major depressive disorder), recurrent severe, without psychosis  (HCC)    Plan   ## Psychiatric Medication Recommendations:  Continue Remeron 15 mg Continue Trazodone 200 mg daily at bedtime Continue Ability 5 mg daily Continue Effexor 150 mg daily Increase Buspar 10 mg daily to BID   ## Medical Decision Making Capacity: Not specifically addressed in this encounter  ## Further Work-up:  -- most recent EKG on 10/02/2023 had QtC of 476 -- Pertinent labwork reviewed earlier this admission includes: CBC, U/A, blood gas, CMP, A1C   ## Disposition:-- There are no psychiatric contraindications to discharge at this time  ## Behavioral / Environmental: - No specific recommendations at this time.     ## Safety and Observation Level:  - Based on my clinical evaluation, I estimate the patient to be at low risk of self harm in the current setting. - At this time, we recommend  routine. This decision is based on my review of the chart including patient's history and current presentation, interview of the patient, mental status examination, and consideration of suicide risk including evaluating suicidal ideation, plan, intent, suicidal or self-harm behaviors, risk factors, and protective factors. This judgment is based on our ability to directly address suicide risk, implement suicide prevention strategies, and develop a safety plan while the patient is in the clinical setting. Please contact our team if there is a concern that risk level has changed.  CSSR Risk Category:C-SSRS RISK CATEGORY: Moderate Risk  Suicide Risk Assessment: Patient has following modifiable risk factors for suicide: None, which we are addressing by setting up a therapy appointment.  Patient has following non-modifiable or demographic risk factors for suicide: male gender, history of suicide attempt, and psychiatric hospitalization Patient has the following protective factors against suicide: Access to outpatient mental health care, Supportive family, Cultural, spiritual, or religious  beliefs that discourage suicide, Pets in the home, and no history of NSSIB  Thank you for this consult request. Recommendations have been communicated to the primary team.  We will continue to follow at this time.   Lawrence Means, NP       History of Present Illness  Relevant Aspects of [facility] Course:  Admitted on 10/02/2023 for hypoglycemia.   Patient Report:  The client was watching tv prior to the assessment.  He engaged easily in conversation.  His depression has increased recently to a moderate level since he started developing weakness in both legs and cannot walk along with the helicopter/plane crash on the news.  Passive suicidal ideations at times, none currently.  One past suicide threat to shoot himself when he and his wife were divorcing in 2010, hospitalized afterwards, no other hospitalizations.  "I learned my lesson."  He has a provider for medication management at [facility] and compliant with his medications.  Appetite is increased in the hospital, sleep is "ok" in the hospital, "fairly well" at home.  Anxiety is moderate with no panic attacks.  Denies psychosis and substance abuse.  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  Nightmares started back nightly after the helicopter/plane crash in [place] recently as he had comrades in the [place] who had a helicopter crash with 3 of the 4 dying along and finding people after boat and plane crashes.  Some flashbacks recently, "I thought I had this behind me", tearful at times as he talked about things.  Therapeutic communication provided along with support.  He lives with a supportive girlfriend and his "little angel", Chloe, his dog.  Discussed medications and agreeable to increase his Buspar and to attend therapy, called placed to [facility] to arrange this for him.  2/27: The client is "hanging in there".  His depression is "fair high" as he is overwhelmed with recent information that his  girlfriend told him that she can no longer take care of him.  They have been together since 2008 and have a house together.  He is scheduled to go to rehab after stabilization and then "I know I'm going to have to go to a nursing home."   His girlfriend is still being supportive just can no longer physically care for him.  He was able to transfer with a lift to the chair today with PT.  Emotional support provided as he processed this information.  He did report his suicidal ideations are "not so bad" today, passive in nature with no intent.   A couple of flashbacks today, less PTSD symptoms today.  His appetite is "good", sleep was not good, "I fear I will not wake up".  He is also having pain in his legs and visibly restlessness noted on assessment. He was interested in therapy and an appointment was made at his outpatient facility, time and date given to him which he put in his phone.  4/15 at 10 am with Adalberto Ill at [facility].  Will continue to monitor his sleep before adjusting medications as he is on 200 mg of Trazodone and 15 mg of  Remeron.  3/1: Client reports depression is "okay", but he is in a "pretty good mood"  since his girlfriend [name] and emotional support dog were just here at the hospital today to visit.  He reports a good appetite, jokingly referring to his stomach and that he never has a problem with appetite.  When discussing his thoughts and mood, he indicates the television reports of the helicopter crash have decreased, which has helped him by reducing the exposure he has to those images.  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  Discussed ways to acknowledge the past, while not letting it rule his thoughts or direct his behaviors in the present.  Client agrees and is amenable to continuing to work on his thoughts and how those affect him now.  He admits some thoughts of suicide last night,  but states they were short lived and he had no plan or action with the thought.  When asked what kept him from acting on the thoughts, client states his girlfriend and dog are good reasons for him to stay alive.  He reports that there are a number of people who want him to stay here and continue living, but it can be difficult.  Client reported pain in the right foot and had noticeable continuous movement of both legs during assessment.  Reported pain to his nurse for continued pain management.  Client acknowledges rehab is a good next step, but had some uncertainty about discharge today or tomorrow.  He selected a rehab facility and seems content with the decision.  No obvious signs of distress, he interacted well, answered questions appropriately, made good eye contact, and though discussing some delicate subjects, was calm and pleasant.  Collateral information obtained from his girlfriend, [name], who has no safety concerns for the patient.  She wanted the team to emphasize to the patient that she is not leaving him and is going to be there for him in rehab and a SNF.    Psych ROS:  Depression: moderate Anxiety:  moderate Mania (lifetime and current): denies Psychosis: (lifetime and current): denies  Review of Systems  Constitutional: Negative.   HENT: Negative.    Eyes: Negative.   Respiratory: Negative.    Cardiovascular: Negative.   Gastrointestinal: Negative.   Genitourinary: Negative.   Musculoskeletal:  Positive for myalgias (right foot).       Pain and restlessness in his legs  Skin: Negative.   Neurological: Negative.   Psychiatric/Behavioral:  Positive for depression.   All other systems reviewed and are negative.    Psychiatric and Social History  Psychiatric History:  Information collected from patient and chart  Prev Dx/Sx: PTSD, MDD, anxiety Current Psych Provider: Katheran James. 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 rate 18, height 6\' 2"  (1.88 m), weight (!) 156.6 kg, SpO2 (!) 89%. Body mass index is 44.33 kg/m.  Physical Exam Vitals and nursing note reviewed.  Constitutional:      Appearance: Normal appearance. He is obese.  HENT:     Head: Normocephalic and atraumatic.     Right Ear: External ear normal.     Left Ear: External ear normal.     Nose: Nose normal.     Mouth/Throat:     Mouth: Mucous membranes are moist.     Pharynx: Oropharynx is clear.  Eyes:     Conjunctiva/sclera: Conjunctivae normal.  Pulmonary:     Effort: Pulmonary effort is normal.  Musculoskeletal:     Cervical back: Normal range of motion.  Skin:    General: Skin is dry.  Neurological:     General: No focal deficit present.     Mental Status: He is alert and oriented to person, place, and time.  Psychiatric:        Mood and Affect: Mood normal.     Mental Status Exam: General Appearance: Casual  Orientation:  Full (Time, Place, and Person)  Memory:  Immediate;   Good Recent;   Good Remote;   Good  Concentration:   Concentration: Good and Attention Span: Good  Recall:  Good  Attention  Good  Eye Contact:  Good  Speech:  Normal Rate  Language:  Good  Volume:  Normal  Mood: depressed, anxious  Affect:  Congruent  Thought Process:  Coherent  Thought Content:  Logical  Suicidal Thoughts:  No  Homicidal Thoughts:  No  Judgement:  Good  Insight:  Good  Psychomotor Activity:  Decreased  Akathisia:  No  Fund of Knowledge:  Good      Assets:  Housing Intimacy Leisure Time Resilience Social Support  Cognition:  WNL  ADL's:  Impaired  AIMS (if indicated):        Other History   These have been pulled in through the EMR, reviewed, and updated if appropriate.  Family History:  The patient's family history includes CAD in his maternal grandfather; Diabetes in an other family member.  Medical History: Past Medical History:  Diagnosis Date   Acquired dilation of ascending aorta and aortic root (HCC)    40mm by echo 01/2021   Adenomatous colon polyp 2007   Anemia    Anxiety    Asthma    BPH without obstruction/lower urinary tract symptoms 02/22/2017   Chronic diastolic (congestive) heart failure (HCC)    Chronic venous stasis 03/07/2019   COPD (chronic obstructive pulmonary disease) (HCC)    Coronary artery calcification seen on CAT scan    Depression    Diabetic neuropathy (HCC) 09/11/2019   History of colon polyps 08/24/2018   Hypertension    Morbid obesity (HCC)    OSA (obstructive sleep apnea)    Pain due to onychomycosis of toenails of both feet 09/11/2019   Peripheral neuropathy 02/22/2017   Primary osteoarthritis, left shoulder 03/05/2017   PTSD (post-traumatic stress disorder)    Pure hypercholesterolemia    QT prolongation 03/07/2019   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 Lynnette Caffey and Dr. Laneta Simmers   Seizures [facility])    Severe aortic stenosis  Sinus tachycardia 03/07/2019   Sleep apnea    CPAP   Type 2  diabetes mellitus with vascular disease (HCC) 09/11/2019    Surgical History: Past Surgical History:  Procedure Laterality Date   ENDOVENOUS ABLATION SAPHENOUS VEIN W/ LASER Right 08/20/2020   endovenous laser ablation right greater saphenous vein by Cari Caraway MD    ENDOVENOUS ABLATION SAPHENOUS VEIN W/ LASER Left 11/16/2022   endovenous laser ablation left greater saphenous vein by Cari Caraway MD   INTRAOPERATIVE TRANSTHORACIC ECHOCARDIOGRAM N/A 02/07/2023   Procedure: INTRAOPERATIVE TRANSTHORACIC ECHOCARDIOGRAM;  Surgeon: Orbie Pyo, MD;  Location: [facility] INVASIVE CV LAB;  Service: Open Heart Surgery;  Laterality: N/A;   JOINT REPLACEMENT     left knee replacement x 2   KNEE ARTHROSCOPY Bilateral    LEFT HEART CATH AND CORONARY ANGIOGRAPHY N/A 01/17/2018   Procedure: LEFT HEART CATH AND CORONARY ANGIOGRAPHY;  Surgeon: Marykay Lex, MD;  Location: [facility] INVASIVE CV LAB;  Service: Cardiovascular;  Laterality: N/A;   PRESSURE SENSOR/CARDIOMEMS N/A 08/26/2021   Procedure: PRESSURE SENSOR/CARDIOMEMS;  Surgeon: Laurey Morale, MD;  Location: [facility] INVASIVE CV LAB;  Service: Cardiovascular;  Laterality: N/A;   RIGHT HEART CATH N/A 08/26/2021   Procedure: RIGHT HEART CATH;  Surgeon: Laurey Morale, MD;  Location: [facility] INVASIVE CV LAB;  Service: Cardiovascular;  Laterality: N/A;   RIGHT HEART CATH AND CORONARY ANGIOGRAPHY N/A 01/26/2023   Procedure: RIGHT HEART CATH AND CORONARY ANGIOGRAPHY;  Surgeon: Orbie Pyo, MD;  Location: [facility] INVASIVE CV LAB;  Service: Cardiovascular;  Laterality: N/A;   TEE WITHOUT CARDIOVERSION N/A 08/26/2021   Procedure: TRANSESOPHAGEAL ECHOCARDIOGRAM (TEE);  Surgeon: Laurey Morale, MD;  Location: [facility] ENDOSCOPY;  Service: Cardiovascular;  Laterality: N/A;   TOOTH EXTRACTION N/A 02/03/2023   Procedure: DENTAL RESTORATION/EXTRACTIONS;  Surgeon: [name], DMD;  Location: [facility] OR;  Service: Oral Surgery;  Laterality: N/A;   TRANSCATHETER AORTIC VALVE  REPLACEMENT, TRANSFEMORAL Right 02/07/2023   Procedure: Transcatheter Aortic Valve Replacement, Transfemoral;  Surgeon: Orbie Pyo, MD;  Location: [facility] INVASIVE CV LAB;  Service: Open Heart Surgery;  Laterality: Right;   UMBILICAL HERNIA REPAIR       Medications:   Current Facility-Administered Medications:    acetaminophen (TYLENOL) tablet 1,000 mg, 1,000 mg, Oral, Q6H PRN, Ernie Avena, MD, 1,000 mg at 10/07/23 0848   albuterol (PROVENTIL) (2.5 MG/3ML) 0.083% nebulizer solution 2.5 mg, 2.5 mg, Nebulization, Q4H PRN, Howerter, Justin B, DO   apixaban (ELIQUIS) tablet 5 mg, 5 mg, Oral, BID, Howerter, Justin B, DO, 5 mg at 10/07/23 0847   ARIPiprazole (ABILIFY) tablet 5 mg, 5 mg, Oral, QHS, Berton Mount I, MD, 5 mg at 10/06/23 2130   atorvastatin (LIPITOR) tablet 40 mg, 40 mg, Oral, QHS, Howerter, Justin B, DO, 40 mg at 10/06/23 2131   busPIRone (BUSPAR) tablet 10 mg, 10 mg, Oral, TID, Narda Bonds, MD, 10 mg at 10/07/23 4098   cyanocobalamin (VITAMIN B12) tablet 1,000 mcg, 1,000 mcg, Oral, Daily, Berton Mount I, MD, 1,000 mcg at 10/07/23 0848   dextrose 50 % solution 50 mL, 1 ampule, Intravenous, Q1H PRN, Howerter, Justin B, DO   diclofenac Sodium (VOLTAREN) 1 % topical gel 4 g, 4 g, Topical, QID, Nettey, Ralph A, MD, 4 g at 10/07/23 1259   ferrous sulfate tablet 324 mg, 324 mg, Oral, Q breakfast, Berton Mount I, MD, 324 mg at 10/07/23 0848   fluticasone furoate-vilanterol (BREO ELLIPTA) 200-25 MCG/ACT 1 puff, 1 puff, Inhalation, Daily, 1 puff at  10/07/23 0904 **AND** umeclidinium bromide (INCRUSE ELLIPTA) 62.5 MCG/ACT 1 puff, 1 puff, Inhalation, Daily, Howerter, Justin B, DO, 1 puff at 10/07/23 0904   hydrALAZINE (APRESOLINE) tablet 25 mg, 25 mg, Oral, Q6H PRN, Narda Bonds, MD   HYDROcodone-acetaminophen (NORCO/VICODIN) 5-325 MG per tablet 1 tablet, 1 tablet, Oral, Q4H PRN, Berton Mount I, MD, 1 tablet at 10/07/23 1248   insulin aspart (novoLOG) injection 0-6  Units, 0-6 Units, Subcutaneous, TID WC, Howerter, Justin B, DO, 2 Units at 10/06/23 0827   lacosamide (VIMPAT) tablet 150 mg, 150 mg, Oral, BID, Berton Mount I, MD, 150 mg at 10/07/23 0847   levETIRAcetam (KEPPRA) tablet 500 mg, 500 mg, Oral, BID, Howerter, Justin B, DO, 500 mg at 10/07/23 0847   melatonin tablet 3 mg, 3 mg, Oral, QHS PRN, Howerter, Justin B, DO, 3 mg at 10/06/23 2130   methocarbamol (ROBAXIN) tablet 500 mg, 500 mg, Oral, Q8H PRN, Narda Bonds, MD, 500 mg at 10/07/23 0848   metoprolol succinate (TOPROL-XL) 24 hr tablet 50 mg, 50 mg, Oral, Daily, Narda Bonds, MD, 50 mg at 10/07/23 0847   mirtazapine (REMERON) tablet 15 mg, 15 mg, Oral, QHS, Berton Mount I, MD, 15 mg at 10/06/23 2130   naloxone [facility]) injection 0.4 mg, 0.4 mg, Intravenous, PRN, Howerter, Justin B, DO   ondansetron (ZOFRAN) injection 4 mg, 4 mg, Intravenous, Q6H PRN, Howerter, Justin B, DO   polyethylene glycol (MIRALAX / GLYCOLAX) packet 17 g, 17 g, Oral, Daily, Berton Mount I, MD, 17 g at 10/07/23 0846   pregabalin (LYRICA) capsule 150 mg, 150 mg, Oral, TID, Berton Mount I, MD, 150 mg at 10/07/23 0847   sodium chloride flush (NS) 0.9 % injection 3 mL, 3 mL, Intravenous, Q12H, Ernie Avena, MD, 3 mL at 10/07/23 0936   sodium chloride flush (NS) 0.9 % injection 3 mL, 3 mL, Intravenous, PRN, Ernie Avena, MD   torsemide [facility]) tablet 60 mg, 60 mg, Oral, BID, Howerter, Justin B, DO, 60 mg at 10/07/23 0847   traZODone (DESYREL) tablet 200 mg, 200 mg, Oral, QHS PRN, Charm Rings, NP, 200 mg at 10/06/23 2204   venlafaxine XR (EFFEXOR-XR) 24 hr capsule 150 mg, 150 mg, Oral, Q breakfast, Mariel Craft, MD, 150 mg at 10/07/23 5366  Allergies: Allergies  Allergen Reactions   Vancomycin Other (See Comments)    "Red Man Syndrome" 02/02/17: possible cause for rash under both arms   Niacin And Related Other (See Comments)    Red man syndrome   Tubersol [Tuberculin, Ppd] Other (See  Comments)    Reaction unknown   Doxycycline Rash and Other (See Comments)    Lawrence Means, NP

## 2023-10-07 NOTE — Progress Notes (Signed)
 PROGRESS NOTE    Lawrence [name]  WUJ:[id number] DOB: 06-12-1956 DOA: 10/02/2023 PCP: [name], PA-C   Brief Narrative: Lawrence [name] is a 68 y.o. male with a history of diabetes mellitus type 2, chronic diastolic heart failure, COPD, severe aortic stenosis s/p TAVR, seizure disorder, depression, anxiety, diastolic heart failure, morbid obesity.  Patient presented secondary to hypoglycemia with recurrent episodes.   Assessment and Plan:  Recurrent hypoglycemia In setting of insulin use for diabetes mellitus. Patient has recently had an increase in basal insulin by 5 units per history. Patient received D50 and home [place] held. Hypoglycemia resolved.  Diabetes mellitus type 2 Uncontrolled with hypoglycemia. Patient is on Tresiba, Prandin, Novolog and [place] as an outpatient, which was held. -Hold long acting insulin -Continue SSI  Acute metabolic encephalopathy Secondary to hypoglycemia. Resolved.  Paroxysmal atrial fibrillation Currently in sinus rhythm. Patient is on Eliquis and Toprol-XL as an outpatient. -Continue Eliquis and Toprol XL  SIRS No infectious source identified. Presentation seems likely related to hypoglycemia.  Chronic diastolic heart failure -Continue torsemide  History of seizures -Continue Keppra  Severe aortic stenosis s/p TAVR Noted.  Peripheral neuropathy -Continue Lyrica -Continue Voltaren gel  History of CVA Left hemiplegia Noted. Patient is on Coumadin for paroxysmal atrial fibrillation. -PT/OT eval  Anxiety Depression -Continue Remeron -Continue Buspar and Abilify  BPH -Continue prazosin  COPD -Continue Incruse Ellipta and Breo Ellipta  Morbid obesity, class III Estimated body mass index is 44.33 kg/m as calculated from the following:   Height as of this encounter: 6\' 2"  (1.88 m).   Weight as of this encounter: 156.6 kg.   DVT prophylaxis: Eliquis Code Status:   Code Status: Limited: Do not attempt  resuscitation (DNR) -DNR-LIMITED -Do Not Intubate/DNI  Family Communication: Significant other at bedside Disposition Plan: Discharge to SNF pending bed availability. Medically stable for discharge.   Consultants:  None  Procedures:  None  Antimicrobials: None    Subjective: No concerns this morning. Did not use his CPAP overnight.  Objective: BP 116/80 (BP Location: Left Arm)   Pulse 78   Temp 98.3 F (36.8 C)   Resp 18   Ht 6\' 2"  (1.88 m)   Wt (!) 156.6 kg   SpO2 93%   BMI 44.33 kg/m   Examination:  General exam: Appears calm and comfortable    Data Reviewed: I have personally reviewed following labs and imaging studies  CBC Lab Results  Component Value Date   WBC 10.4 10/03/2023   RBC 4.61 10/03/2023   HGB 13.8 10/03/2023   HCT 41.8 10/03/2023   MCV 90.7 10/03/2023   MCH 29.9 10/03/2023   PLT 266 10/03/2023   MCHC 33.0 10/03/2023   RDW 15.1 10/03/2023   LYMPHSABS 1.7 10/03/2023   MONOABS 0.8 10/03/2023   EOSABS 0.4 10/03/2023   BASOSABS 0.0 10/03/2023     Last metabolic panel Lab Results  Component Value Date   NA 137 10/03/2023   K 4.0 10/03/2023   CL 104 10/03/2023   CO2 27 10/03/2023   BUN 17 10/03/2023   CREATININE 1.16 10/03/2023   GLUCOSE 103 (H) 10/03/2023   GFRNONAA >60 10/03/2023   GFRAA 77 05/20/2020   CALCIUM 8.2 (L) 10/03/2023   PHOS 3.1 07/23/2023   PROT 6.2 (L) 10/03/2023   ALBUMIN 3.4 (L) 10/03/2023   LABGLOB 2.8 02/08/2023   BILITOT 0.6 10/03/2023   ALKPHOS 35 (L) 10/03/2023   AST 19 10/03/2023   ALT 13 10/03/2023   ANIONGAP 6  10/03/2023    GFR: Estimated Creatinine Clearance: 99.2 mL/min (by C-G formula based on SCr of 1.16 mg/dL).  Recent Results (from the past 240 hours)  Resp panel by RT-PCR (RSV, Flu A&B, Covid) Anterior Nasal Swab     Status: None   Collection Time: 10/02/23  6:50 PM   Specimen: Anterior Nasal Swab  Result Value Ref Range Status   SARS Coronavirus 2 by RT PCR NEGATIVE NEGATIVE Final    Influenza A by PCR NEGATIVE NEGATIVE Final   Influenza B by PCR NEGATIVE NEGATIVE Final    Comment: (NOTE) The Xpert Xpress SARS-CoV-2/FLU/RSV plus assay is intended as an aid in the diagnosis of influenza from Nasopharyngeal swab specimens and should not be used as a sole basis for treatment. Nasal washings and aspirates are unacceptable for Xpert Xpress SARS-CoV-2/FLU/RSV testing.  Fact Sheet for Patients: BloggerCourse.com  Fact Sheet for Healthcare Providers: SeriousBroker.it  This test is not yet approved or cleared by the [place] FDA and has been authorized for detection and/or diagnosis of SARS-CoV-2 by FDA under an Emergency Use Authorization (EUA). This EUA will remain in effect (meaning this test can be used) for the duration of the COVID-19 declaration under Section 564(b)(1) of the Act, 21 U.S.C. section 360bbb-3(b)(1), unless the authorization is terminated or revoked.     Resp Syncytial Virus by PCR NEGATIVE NEGATIVE Final    Comment: (NOTE) Fact Sheet for Patients: BloggerCourse.com  Fact Sheet for Healthcare Providers: SeriousBroker.it  This test is not yet approved or cleared by the [place] FDA and has been authorized for detection and/or diagnosis of SARS-CoV-2 by FDA under an Emergency Use Authorization (EUA). This EUA will remain in effect (meaning this test can be used) for the duration of the COVID-19 declaration under Section 564(b)(1) of the Act, 21 U.S.C. section 360bbb-3(b)(1), unless the authorization is terminated or revoked.  Performed at [facility], [address] [place], [place] [address]       Radiology Studies: No results found.     LOS: 0 days    Jacquelin Hawking, MD [facility] 10/07/2023, 9:07 AM   If 7PM-7AM, please contact night-coverage [email or web address]

## 2023-10-08 DIAGNOSIS — E162 Hypoglycemia, unspecified: Secondary | ICD-10-CM | POA: Diagnosis not present

## 2023-10-08 DIAGNOSIS — R29898 Other symptoms and signs involving the musculoskeletal system: Secondary | ICD-10-CM | POA: Diagnosis not present

## 2023-10-08 LAB — GLUCOSE, CAPILLARY
Glucose-Capillary: 93 mg/dL (ref 70–99)
Glucose-Capillary: 94 mg/dL (ref 70–99)

## 2023-10-08 NOTE — Plan of Care (Signed)

## 2023-10-08 NOTE — Progress Notes (Addendum)
 PROGRESS NOTE    MAKANI SECKMAN  MVH:[id number] DOB: May 21, 1956 DOA: 10/02/2023 PCP: [name], PA-C   Brief Narrative: Lawrence [name] is a 68 y.o. male with a history of diabetes mellitus type 2, chronic diastolic heart failure, COPD, severe aortic stenosis s/p TAVR, seizure disorder, depression, anxiety, diastolic heart failure, morbid obesity.  Patient presented secondary to hypoglycemia with recurrent episodes.   Assessment and Plan:  Recurrent hypoglycemia In setting of insulin use for diabetes mellitus. Patient has recently had an increase in basal insulin by 5 units per history. Patient received D50 and home [place] held. Hypoglycemia resolved.  Diabetes mellitus type 2 Uncontrolled with hypoglycemia. Patient is on Tresiba, Prandin, Novolog and [place] as an outpatient, which was held. -Hold long acting insulin -Continue SSI  Acute metabolic encephalopathy Secondary to hypoglycemia. Resolved.  Paroxysmal atrial fibrillation Currently in sinus rhythm. Patient is on Eliquis and Toprol-XL as an outpatient. -Continue Eliquis and Toprol XL  SIRS No infectious source identified. Presentation seems likely related to hypoglycemia.  Chronic diastolic heart failure -Continue torsemide  History of seizures -Continue Keppra  Severe aortic stenosis s/p TAVR Noted.  Peripheral neuropathy -Continue Lyrica -Continue Voltaren gel  Generalized weakness Patient with upper and lower extremity weakness. No back/neck pain. No injuries noted. Patient states this has been for the past week, however on chart review, it appears he has been worked up for similar symptoms in the past. MRI from 2022 shows arthritis and mild spinal canal stenosis. Patient also with history of lumbar MRI significant for small central disc protrusion; unclear if he has worsening disease leading to lower extremity weakness. -MRI cervical/lumbar spine  History of CVA Left hemiplegia Noted.  Patient is on Coumadin for paroxysmal atrial fibrillation. -PT/OT eval  Anxiety Depression -Continue Remeron -Continue Buspar and Abilify  BPH -Continue prazosin  COPD -Continue Incruse Ellipta and Breo Ellipta  Morbid obesity, class III Estimated body mass index is 44.33 kg/m as calculated from the following:   Height as of this encounter: 6\' 2"  (1.88 m).   Weight as of this encounter: 156.6 kg.   DVT prophylaxis: Eliquis Code Status:   Code Status: Limited: Do not attempt resuscitation (DNR) -DNR-LIMITED -Do Not Intubate/DNI  Family Communication: Significant other at bedside Disposition Plan: Discharge to SNF pending bed availability. Medically stable for discharge.   Consultants:  None  Procedures:  None  Antimicrobials: None    Subjective: Patient reports he has been having weakness in his upper arms, mostly affecting his ability to raise his arms above his shoulder level.  Objective: BP 114/72   Pulse 74   Temp 97.8 F (36.6 C) (Oral)   Resp 18   Ht 6\' 2"  (1.88 m)   Wt (!) 156.6 kg   SpO2 92%   BMI 44.33 kg/m   Examination:  General exam: Appears calm and comfortable  Respiratory system: Respiratory effort normal. Central nervous system: Alert and oriented. 3-4/5 upper extremity strength. Could not obtain upper arm reflexes Psychiatry: Judgement and insight appear normal. Mood & affect appropriate.    Data Reviewed: I have personally reviewed following labs and imaging studies  CBC Lab Results  Component Value Date   WBC 10.4 10/03/2023   RBC 4.61 10/03/2023   HGB 13.8 10/03/2023   HCT 41.8 10/03/2023   MCV 90.7 10/03/2023   MCH 29.9 10/03/2023   PLT 266 10/03/2023   MCHC 33.0 10/03/2023   RDW 15.1 10/03/2023   LYMPHSABS 1.7 10/03/2023   MONOABS 0.8 10/03/2023  EOSABS 0.4 10/03/2023   BASOSABS 0.0 10/03/2023     Last metabolic panel Lab Results  Component Value Date   NA 137 10/03/2023   K 4.0 10/03/2023   CL 104 10/03/2023    CO2 27 10/03/2023   BUN 17 10/03/2023   CREATININE 1.16 10/03/2023   GLUCOSE 103 (H) 10/03/2023   GFRNONAA >60 10/03/2023   GFRAA 77 05/20/2020   CALCIUM 8.2 (L) 10/03/2023   PHOS 3.1 07/23/2023   PROT 6.2 (L) 10/03/2023   ALBUMIN 3.4 (L) 10/03/2023   LABGLOB 2.8 02/08/2023   BILITOT 0.6 10/03/2023   ALKPHOS 35 (L) 10/03/2023   AST 19 10/03/2023   ALT 13 10/03/2023   ANIONGAP 6 10/03/2023    GFR: Estimated Creatinine Clearance: 99.2 mL/min (by C-G formula based on SCr of 1.16 mg/dL).  Recent Results (from the past 240 hours)  Resp panel by RT-PCR (RSV, Flu A&B, Covid) Anterior Nasal Swab     Status: None   Collection Time: 10/02/23  6:50 PM   Specimen: Anterior Nasal Swab  Result Value Ref Range Status   SARS Coronavirus 2 by RT PCR NEGATIVE NEGATIVE Final   Influenza A by PCR NEGATIVE NEGATIVE Final   Influenza B by PCR NEGATIVE NEGATIVE Final    Comment: (NOTE) The Xpert Xpress SARS-CoV-2/FLU/RSV plus assay is intended as an aid in the diagnosis of influenza from Nasopharyngeal swab specimens and should not be used as a sole basis for treatment. Nasal washings and aspirates are unacceptable for Xpert Xpress SARS-CoV-2/FLU/RSV testing.  Fact Sheet for Patients: BloggerCourse.com  Fact Sheet for Healthcare Providers: SeriousBroker.it  This test is not yet approved or cleared by the [place] FDA and has been authorized for detection and/or diagnosis of SARS-CoV-2 by FDA under an Emergency Use Authorization (EUA). This EUA will remain in effect (meaning this test can be used) for the duration of the COVID-19 declaration under Section 564(b)(1) of the Act, 21 U.S.C. section 360bbb-3(b)(1), unless the authorization is terminated or revoked.     Resp Syncytial Virus by PCR NEGATIVE NEGATIVE Final    Comment: (NOTE) Fact Sheet for Patients: BloggerCourse.com  Fact Sheet for Healthcare  Providers: SeriousBroker.it  This test is not yet approved or cleared by the [place] FDA and has been authorized for detection and/or diagnosis of SARS-CoV-2 by FDA under an Emergency Use Authorization (EUA). This EUA will remain in effect (meaning this test can be used) for the duration of the COVID-19 declaration under Section 564(b)(1) of the Act, 21 U.S.C. section 360bbb-3(b)(1), unless the authorization is terminated or revoked.  Performed at [facility], [address] [place], [place] [address]       Radiology Studies: No results found.     LOS: 0 days    Jacquelin Hawking, MD [facility] 10/08/2023, 11:49 AM   If 7PM-7AM, please contact night-coverage [email or web address]

## 2023-10-09 ENCOUNTER — Observation Stay (HOSPITAL_COMMUNITY)

## 2023-10-09 DIAGNOSIS — E162 Hypoglycemia, unspecified: Secondary | ICD-10-CM | POA: Diagnosis not present

## 2023-10-09 DIAGNOSIS — R29898 Other symptoms and signs involving the musculoskeletal system: Secondary | ICD-10-CM | POA: Diagnosis not present

## 2023-10-09 LAB — GLUCOSE, CAPILLARY
Glucose-Capillary: 105 mg/dL — ABNORMAL HIGH (ref 70–99)
Glucose-Capillary: 137 mg/dL — ABNORMAL HIGH (ref 70–99)
Glucose-Capillary: 92 mg/dL (ref 70–99)
Glucose-Capillary: 93 mg/dL (ref 70–99)
Glucose-Capillary: 99 mg/dL (ref 70–99)

## 2023-10-09 MED ORDER — LORAZEPAM 2 MG/ML IJ SOLN
1.0000 mg | Freq: Once | INTRAMUSCULAR | Status: AC | PRN
  Administered 2023-10-09: 1 mg via INTRAVENOUS
  Filled 2023-10-09: qty 1

## 2023-10-09 NOTE — Progress Notes (Signed)
Patient placed himself on home CPAP for the night.  

## 2023-10-09 NOTE — Plan of Care (Signed)

## 2023-10-09 NOTE — Progress Notes (Signed)
 Occupational Therapy Treatment Patient Details Name: Lawrence [name] MRN: [id number] DOB: 1955-10-04 Today's Date: 10/09/2023   History of present illness Pt is a 68 y/o M admitted on 10/02/23 after presenting with AMS & lethargy. Pt is being treated for recurrent hypoglycemia. PMH: DM2, chronic diastolic heart failure, COPD, severe aortic stenosis s/p TAVR, seizure disorder, depression, anxiety, diastolic heart failure, morbid obesity, CVA with residual L sided weakness, Nov '24 L cerebellar CVA, spinal stimulator   OT comments  Pt making gradual progress towards OT goals this session. Pt continues to present with global deconditioning. Planned to work on standing and functional mobility this session with skilled + 2 assistance present however pt declined stating that "every time he stood he fell. " Pt agreeable to complete lateral scoot to recliner to R side as lunch had just arrived and pt eager to eat lunch OOB. Pt completed task with MIN A +2 mostly for safety and to manage bed pads. Patient will benefit from continued inpatient follow up therapy, <3 hours/day        If plan is discharge home, recommend the following:  Two people to help with walking and/or transfers;A lot of help with bathing/dressing/bathroom;Assistance with cooking/housework;Direct supervision/assist for medications management;Direct supervision/assist for financial management;Assist for transportation;Help with stairs or ramp for entrance   Equipment Recommendations  Hoyer lift    Recommendations for Other Services      Precautions / Restrictions Precautions Precautions: Fall Restrictions Weight Bearing Restrictions Per Provider Order: No       Mobility Bed Mobility Overal bed mobility: Needs Assistance Bed Mobility: Supine to Sit     Supine to sit: Contact guard, Supervision, HOB elevated     General bed mobility comments: + time and heavy reliance on bed features    Transfers Overall transfer  level: Needs assistance Equipment used: None              Lateral/Scoot Transfers: +2 physical assistance, +2 safety/equipment, Min assist General transfer comment: pt declined attempting to stand even with + assist provided, pt only wanting to scoot into recliner. pt completed task with MIN A +2 mostly for safety and assist to manage bed pads while scooting. + weakness noted in BUEs even with scooting     Balance Overall balance assessment: Needs assistance Sitting-balance support: Feet supported, Bilateral upper extremity supported Sitting balance-Leahy Scale: Good         Standing balance comment: NT                           ADL either performed or assessed with clinical judgement   ADL Overall ADL's : Needs assistance/impaired                     Lower Body Dressing: Total assistance;Bed level Lower Body Dressing Details (indicate cue type and reason): to don socks from bed level Toilet Transfer: Minimal assistance;+2 for safety/equipment;+2 for physical assistance Toilet Transfer Details (indicate cue type and reason): simulated via lateral scoot to recliner to R side         Functional mobility during ADLs: Minimal assistance;+2 for physical assistance;+2 for safety/equipment;Cueing for safety;Cueing for sequencing (lateral scoot to recliner to R side) General ADL Comments: ADL participation impacted by decreased activity tolerance and generalized weakness    Extremity/Trunk Assessment Upper Extremity Assessment Upper Extremity Assessment: Generalized weakness   Lower Extremity Assessment Lower Extremity Assessment: Generalized weakness   Cervical / Trunk Assessment Cervical /  Trunk Assessment: Other exceptions (body habitus)    Vision   Vision Assessment?: No apparent visual deficits   Perception Perception Perception: Not tested   Praxis Praxis Praxis: Not tested   Communication Communication Communication: No apparent  difficulties   Cognition Arousal: Alert Behavior During Therapy: WFL for tasks assessed/performed, Flat affect Cognition: No apparent impairments                               Following commands: Intact        Cueing   Cueing Techniques: Verbal cues  Exercises      Shoulder Instructions       General Comments      Pertinent Vitals/ Pain       Pain Assessment Pain Assessment: Faces Faces Pain Scale: Hurts little more Pain Location: BLEs Pain Descriptors / Indicators: Discomfort, Grimacing Pain Intervention(s): Monitored during session, Repositioned  Home Living                                          Prior Functioning/Environment              Frequency  Min 1X/week        Progress Toward Goals  OT Goals(current goals can now be found in the care plan section)  Progress towards OT goals: Progressing toward goals (incrementally)  Acute Rehab OT Goals Patient Stated Goal: to eat lunch in chair OT Goal Formulation: With patient Time For Goal Achievement: 10/18/23 Potential to Achieve Goals: Fair  Plan      Co-evaluation                 AM-PAC OT "6 Clicks" Daily Activity     Outcome Measure   Help from another person eating meals?: None Help from another person taking care of personal grooming?: A Little Help from another person toileting, which includes using toliet, bedpan, or urinal?: A Lot Help from another person bathing (including washing, rinsing, drying)?: A Lot Help from another person to put on and taking off regular upper body clothing?: A Lot Help from another person to put on and taking off regular lower body clothing?: A Lot 6 Click Score: 15    End of Session Equipment Utilized During Treatment: Gait belt  OT Visit Diagnosis: Unsteadiness on feet (R26.81);Other abnormalities of gait and mobility (R26.89);Repeated falls (R29.6);Muscle weakness (generalized) (M62.81)   Activity Tolerance  Patient tolerated treatment well   Patient Left in chair;with call bell/phone within reach;with chair alarm set   Nurse Communication Mobility status;Other (comment) (NT present at end of session)        Time: 1136-1150 OT Time Calculation (min): 14 min  Charges: OT General Charges $OT Visit: 1 Visit OT Treatments $Self Care/Home Management : 8-22 mins  Lenor Derrick., COTA/L Acute Rehabilitation Services [phone]   Barron Schmid 10/09/2023, 12:02 PM

## 2023-10-09 NOTE — Progress Notes (Signed)
 Physical Therapy Treatment  Patient Details Name: Lawrence [name] MRN: [id number] DOB: 1955/12/17 Today's Date: 10/09/2023   History of Present Illness Pt is a 68 y/o M admitted on 10/02/23 after presenting with AMS & lethargy. Pt is being treated for recurrent hypoglycemia. PMH: DM2, chronic diastolic heart failure, COPD, severe aortic stenosis s/p TAVR, seizure disorder, depression, anxiety, diastolic heart failure, morbid obesity, CVA with residual L sided weakness, Nov '24 L cerebellar CVA, spinal stimulator    PT Comments  Pt progressing towards physical therapy goals. Was able to perform transfers with up to +2 mod assist and Stedy for support. Pt with decreased strength and muscular endurance in UE's and LE's, however relying heavily on UE's to prop onto 2 posterior chain weakness. Will continue to follow and progress as able per POC.     If plan is discharge home, recommend the following: Two people to help with walking and/or transfers;Two people to help with bathing/dressing/bathroom   Can travel by private vehicle     No  Equipment Recommendations  Other (comment) (Pt wants a Public librarian)    Recommendations for Other Services       Precautions / Restrictions Precautions Precautions: Fall Recall of Precautions/Restrictions: Intact Restrictions Weight Bearing Restrictions Per Provider Order: No     Mobility  Bed Mobility Overal bed mobility: Needs Assistance Bed Mobility: Sit to Supine       Sit to supine: Mod assist, +2 for physical assistance   General bed mobility comments: +2 assist required for LE elevation into bed and trunk control to transition from sitting to supine.    Transfers Overall transfer level: Needs assistance Equipment used: None Transfers: Sit to/from Stand, Bed to chair/wheelchair/BSC Sit to Stand: +2 physical assistance, +2 safety/equipment, Via lift equipment, Mod assist           General transfer comment: Heavy reliance on momentum, and  pt cued for breathing techniques when attempting stand. Pt reporting lightheadedness with standing attempts, however states it is improved with breathing technique. Heavy +2 mod assist for power up and to extend hips enough to clear flaps from [facility]. Pt with heavy use of UE's on Stedy. Transfer via Lift Equipment: Stedy  Ambulation/Gait               General Gait Details: Unable at this time.   Stairs             Wheelchair Mobility     Tilt Bed    Modified Rankin (Stroke Patients Only)       Balance Overall balance assessment: Needs assistance Sitting-balance support: Feet supported, Bilateral upper extremity supported Sitting balance-Leahy Scale: Good     Standing balance support: During functional activity, Bilateral upper extremity supported, Reliant on assistive device for balance Standing balance-Leahy Scale: Poor Standing balance comment: NT                            Communication Communication Communication: No apparent difficulties  Cognition Arousal: Alert Behavior During Therapy: WFL for tasks assessed/performed, Flat affect   PT - Cognitive impairments: No apparent impairments                         Following commands: Intact Following commands impaired: Follows multi-step commands inconsistently    Cueing Cueing Techniques: Verbal cues, Gestural cues  Exercises      General Comments        Pertinent Vitals/Pain Pain  Assessment Pain Assessment: Faces Faces Pain Scale: Hurts even more Pain Location: Back, hips, knees with standing attempts Pain Descriptors / Indicators: Discomfort, Grimacing, Moaning Pain Intervention(s): Limited activity within patient's tolerance, Monitored during session, Repositioned    Home Living                          Prior Function            PT Goals (current goals can now be found in the care plan section) Acute Rehab PT Goals Patient Stated Goal: get better PT  Goal Formulation: With patient Time For Goal Achievement: 10/18/23 Potential to Achieve Goals: Fair Progress towards PT goals: Progressing toward goals    Frequency    Min 1X/week      PT Plan      Co-evaluation              AM-PAC PT "6 Clicks" Mobility   Outcome Measure  Help needed turning from your back to your side while in a flat bed without using bedrails?: A Little Help needed moving from lying on your back to sitting on the side of a flat bed without using bedrails?: A Lot Help needed moving to and from a bed to a chair (including a wheelchair)?: Total Help needed standing up from a chair using your arms (e.g., wheelchair or bedside chair)?: Total Help needed to walk in hospital room?: Total Help needed climbing 3-5 steps with a railing? : Total 6 Click Score: 9    End of Session Equipment Utilized During Treatment: Gait belt Activity Tolerance: Patient tolerated treatment well;Patient limited by fatigue Patient left: in chair;with call bell/phone within reach;with chair alarm set Nurse Communication: Mobility status PT Visit Diagnosis: Muscle weakness (generalized) (M62.81);Other abnormalities of gait and mobility (R26.89);Difficulty in walking, not elsewhere classified (R26.2);Pain Pain - Right/Left:  (bilateral) Pain - part of body: Hip;Knee (back)     Time: 8119-1478 PT Time Calculation (min) (ACUTE ONLY): 18 min  Charges:    $Gait Training: 8-22 mins PT General Charges $$ ACUTE PT VISIT: 1 Visit                     Lawrence [name], PT, DPT Acute Rehabilitation Services Secure Chat Preferred Office: [phone]    Lawrence [name] 10/09/2023, 3:08 PM

## 2023-10-09 NOTE — Progress Notes (Signed)
 PROGRESS NOTE    [name]  WUJ:[id number] DOB: [birth year] DOA: 10/02/2023 PCP: Lawrence Soho, PA-C   Brief Narrative: Lawrence [name] is a 68 y.o. male with a history of diabetes mellitus type 2, chronic diastolic heart failure, COPD, severe aortic stenosis s/p TAVR, seizure disorder, depression, anxiety, diastolic heart failure, morbid obesity.  Patient presented secondary to hypoglycemia with recurrent episodes.   Assessment and Plan:  Recurrent hypoglycemia In setting of insulin use for diabetes mellitus. Patient has recently had an increase in basal insulin by 5 units per history. Patient received D50 and home [place] held. Hypoglycemia resolved.  Diabetes mellitus type 2 Uncontrolled with hypoglycemia. Patient is on Tresiba, Prandin, Novolog and [place] as an outpatient, which was held. -Hold long acting insulin -Continue SSI  Acute metabolic encephalopathy Secondary to hypoglycemia. Resolved.  Paroxysmal atrial fibrillation Currently in sinus rhythm. Patient is on Eliquis and Toprol-XL as an outpatient. -Continue Eliquis and Toprol XL  SIRS No infectious source identified. Presentation seems likely related to hypoglycemia.  Chronic diastolic heart failure -Continue torsemide  History of seizures -Continue Keppra  Severe aortic stenosis s/p TAVR Noted.  Peripheral neuropathy Chronic pain Patient has a spinal cord stimulator. -Continue Lyrica -Continue Voltaren gel  Generalized weakness Patient with upper and lower extremity weakness. No back/neck pain. No injuries noted. Patient states this has been for the past week, however on chart review, it appears he has been worked up for similar symptoms in the past. MRI from 2022 shows arthritis and mild spinal canal stenosis. Patient also with history of lumbar MRI significant for small central disc protrusion; unclear if he has worsening disease leading to lower extremity weakness. -MRI cervical/lumbar  spine pending  History of CVA Left hemiplegia Noted. Patient is on Coumadin for paroxysmal atrial fibrillation. -PT/OT eval  Anxiety Depression -Continue Remeron -Continue Buspar and Abilify  BPH -Continue prazosin  COPD -Continue Incruse Ellipta and Breo Ellipta  Morbid obesity, class III Estimated body mass index is 44.33 kg/m as calculated from the following:   Height as of this encounter: 6\' 2"  (1.88 m).   Weight as of this encounter: 156.6 kg.   DVT prophylaxis: Eliquis Code Status:   Code Status: Limited: Do not attempt resuscitation (DNR) -DNR-LIMITED -Do Not Intubate/DNI  Family Communication: None at bedside Disposition Plan: Discharge to SNF pending bed availability. Medically stable for discharge pending MRI studies.   Consultants:  None  Procedures:  None  Antimicrobials: None    Subjective: No specific concerns this morning.  Objective: BP 125/75   Pulse 66   Temp 98 F (36.7 C)   Resp 18   Ht 6\' 2"  (1.88 m)   Wt (!) 156.6 kg   SpO2 98%   BMI 44.33 kg/m   Examination:  General exam: Appears calm and comfortable Respiratory system: Clear to auscultation. Respiratory effort normal. Cardiovascular system: S1 & S2 heard, RRR. No murmurs. Gastrointestinal system: Abdomen is nondistended, soft and nontender. Normal bowel sounds heard. Central nervous system: Alert and oriented. No focal neurological deficits. Psychiatry: Judgement and insight appear normal. Mood & affect appropriate.    Data Reviewed: I have personally reviewed following labs and imaging studies  CBC Lab Results  Component Value Date   WBC 10.4 10/03/2023   RBC 4.61 10/03/2023   HGB 13.8 10/03/2023   HCT 41.8 10/03/2023   MCV 90.7 10/03/2023   MCH 29.9 10/03/2023   PLT 266 10/03/2023   MCHC 33.0 10/03/2023   RDW 15.1 10/03/2023  LYMPHSABS 1.7 10/03/2023   MONOABS 0.8 10/03/2023   EOSABS 0.4 10/03/2023   BASOSABS 0.0 10/03/2023     Last metabolic panel Lab  Results  Component Value Date   NA 137 10/03/2023   K 4.0 10/03/2023   CL 104 10/03/2023   CO2 27 10/03/2023   BUN 17 10/03/2023   CREATININE 1.16 10/03/2023   GLUCOSE 103 (H) 10/03/2023   GFRNONAA >60 10/03/2023   GFRAA 77 05/20/2020   CALCIUM 8.2 (L) 10/03/2023   PHOS 3.1 07/23/2023   PROT 6.2 (L) 10/03/2023   ALBUMIN 3.4 (L) 10/03/2023   LABGLOB 2.8 02/08/2023   BILITOT 0.6 10/03/2023   ALKPHOS 35 (L) 10/03/2023   AST 19 10/03/2023   ALT 13 10/03/2023   ANIONGAP 6 10/03/2023    GFR: Estimated Creatinine Clearance: 99.2 mL/min (by C-G formula based on SCr of 1.16 mg/dL).  Recent Results (from the past 240 hours)  Resp panel by RT-PCR (RSV, Flu A&B, Covid) Anterior Nasal Swab     Status: None   Collection Time: 10/02/23  6:50 PM   Specimen: Anterior Nasal Swab  Result Value Ref Range Status   SARS Coronavirus 2 by RT PCR NEGATIVE NEGATIVE Final   Influenza A by PCR NEGATIVE NEGATIVE Final   Influenza B by PCR NEGATIVE NEGATIVE Final    Comment: (NOTE) The Xpert Xpress SARS-CoV-2/FLU/RSV plus assay is intended as an aid in the diagnosis of influenza from Nasopharyngeal swab specimens and should not be used as a sole basis for treatment. Nasal washings and aspirates are unacceptable for Xpert Xpress SARS-CoV-2/FLU/RSV testing.  Fact Sheet for Patients: BloggerCourse.com  Fact Sheet for Healthcare Providers: SeriousBroker.it  This test is not yet approved or cleared by the [place] FDA and has been authorized for detection and/or diagnosis of SARS-CoV-2 by FDA under an Emergency Use Authorization (EUA). This EUA will remain in effect (meaning this test can be used) for the duration of the COVID-19 declaration under Section 564(b)(1) of the Act, 21 U.S.C. section 360bbb-3(b)(1), unless the authorization is terminated or revoked.     Resp Syncytial Virus by PCR NEGATIVE NEGATIVE Final    Comment: (NOTE) Fact  Sheet for Patients: BloggerCourse.com  Fact Sheet for Healthcare Providers: SeriousBroker.it  This test is not yet approved or cleared by the [place] FDA and has been authorized for detection and/or diagnosis of SARS-CoV-2 by FDA under an Emergency Use Authorization (EUA). This EUA will remain in effect (meaning this test can be used) for the duration of the COVID-19 declaration under Section 564(b)(1) of the Act, 21 U.S.C. section 360bbb-3(b)(1), unless the authorization is terminated or revoked.  Performed at [facility], [address], [place], [place] [address]       Radiology Studies: No results found.     LOS: 0 days    Jacquelin Hawking, MD [facility] 10/09/2023, 11:44 AM   If 7PM-7AM, please contact night-coverage [email or web address]

## 2023-10-09 NOTE — TOC Progression Note (Addendum)
 Transition of Care [facility]) - Progression Note    Patient Details  Name: Lawrence [name] MRN: [id number] Date of Birth: 1956/07/05  Transition of [facility]) CM/SW Contact  Delilah Shan, LCSWA Phone Number: 10/09/2023, 11:44 AM  Clinical Narrative:     CSW received call from Tennis Must. With [employer] who is going to come and complete interview with patient between 2:00pm-3:00pm today. Insurance authorization currently pending for SNF. CSW will continue to follow.  Patients insurance authorization went to a peer to peer for review. CSW infomed MD. Tele# [phone] option 5. Due by 5 pm today .    Expected Discharge Plan: Skilled Nursing Facility Barriers to Discharge: English as a second language teacher, SNF Pending bed offer, Awaiting State Approval Financial controller)  Expected Discharge Plan and Services In-house Referral: Clinical Social Work     Living arrangements for the past 2 months: Apartment                           HH Arranged: Charity fundraiser, Nurse's Aide HH Agency: [facility] Date HH Agency Contacted: 10/04/23 Time HH Agency Contacted: 1100 Representative spoke with at [facility] Agency: Adele Dan   Social Determinants of Health (SDOH) Interventions SDOH Screenings   Food Insecurity: No Food Insecurity (10/03/2023)  Housing: Low Risk  (10/03/2023)  Transportation Needs: Unmet Transportation Needs (10/03/2023)  Utilities: Not At Risk (10/03/2023)  Alcohol Screen: Low Risk  (06/08/2021)  Financial Resource Strain: Medium Risk (12/06/2021)   Received from [facility]  Physical Activity: Unknown (12/06/2021)   Received from [facility], [facility]  Social Connections: Socially Isolated (10/03/2023)  Stress: No Stress Concern Present (01/21/2022)   Received from [facility], [facility]  Recent Concern: Stress - Stress Concern Present (11/19/2021)   Received from [facility]  Tobacco Use: Medium Risk (10/03/2023)    Readmission Risk Interventions    07/24/2023    11:56 AM 05/08/2023    4:37 PM 01/25/2023    4:23 PM  Readmission Risk Prevention Plan  Transportation Screening Complete Complete Complete  PCP or Specialist Appt within 3-5 Days   Complete  HRI or Home Care Consult   Complete  Social Work Consult for Recovery Care Planning/Counseling   Complete  Palliative Care Screening   Not Applicable  Medication Review Oceanographer) Complete Complete Referral to Pharmacy  PCP or Specialist appointment within 3-5 days of discharge Complete Complete   HRI or Home Care Consult Complete Complete   SW Recovery Care/Counseling Consult Complete Complete   Palliative Care Screening Not Applicable Complete   Skilled Nursing Facility Complete Not Applicable

## 2023-10-10 ENCOUNTER — Other Ambulatory Visit: Payer: Self-pay

## 2023-10-10 ENCOUNTER — Emergency Department (HOSPITAL_COMMUNITY)

## 2023-10-10 ENCOUNTER — Emergency Department (HOSPITAL_COMMUNITY)
Admission: EM | Admit: 2023-10-10 | Discharge: 2023-10-10 | Disposition: A | Discharge status: Skilled Nursing Facility | Attending: Emergency Medicine | Admitting: Emergency Medicine

## 2023-10-10 DIAGNOSIS — E161 Other hypoglycemia: Secondary | ICD-10-CM | POA: Diagnosis not present

## 2023-10-10 DIAGNOSIS — E872 Acidosis, unspecified: Secondary | ICD-10-CM | POA: Diagnosis present

## 2023-10-10 DIAGNOSIS — I639 Cerebral infarction, unspecified: Secondary | ICD-10-CM | POA: Diagnosis not present

## 2023-10-10 DIAGNOSIS — G9389 Other specified disorders of brain: Secondary | ICD-10-CM | POA: Diagnosis not present

## 2023-10-10 DIAGNOSIS — M6281 Muscle weakness (generalized): Secondary | ICD-10-CM | POA: Diagnosis not present

## 2023-10-10 DIAGNOSIS — S0990XA Unspecified injury of head, initial encounter: Secondary | ICD-10-CM | POA: Diagnosis present

## 2023-10-10 DIAGNOSIS — Z7901 Long term (current) use of anticoagulants: Secondary | ICD-10-CM | POA: Insufficient documentation

## 2023-10-10 DIAGNOSIS — E876 Hypokalemia: Secondary | ICD-10-CM | POA: Diagnosis not present

## 2023-10-10 DIAGNOSIS — Z794 Long term (current) use of insulin: Secondary | ICD-10-CM | POA: Diagnosis not present

## 2023-10-10 DIAGNOSIS — S80211A Abrasion, right knee, initial encounter: Secondary | ICD-10-CM | POA: Insufficient documentation

## 2023-10-10 DIAGNOSIS — J449 Chronic obstructive pulmonary disease, unspecified: Secondary | ICD-10-CM | POA: Insufficient documentation

## 2023-10-10 DIAGNOSIS — Z1152 Encounter for screening for COVID-19: Secondary | ICD-10-CM | POA: Diagnosis not present

## 2023-10-10 DIAGNOSIS — N179 Acute kidney failure, unspecified: Secondary | ICD-10-CM | POA: Diagnosis present

## 2023-10-10 DIAGNOSIS — I482 Chronic atrial fibrillation, unspecified: Secondary | ICD-10-CM | POA: Diagnosis present

## 2023-10-10 DIAGNOSIS — N1831 Chronic kidney disease, stage 3a: Secondary | ICD-10-CM | POA: Diagnosis present

## 2023-10-10 DIAGNOSIS — Z953 Presence of xenogenic heart valve: Secondary | ICD-10-CM | POA: Diagnosis not present

## 2023-10-10 DIAGNOSIS — G8194 Hemiplegia, unspecified affecting left nondominant side: Secondary | ICD-10-CM | POA: Diagnosis not present

## 2023-10-10 DIAGNOSIS — G8911 Acute pain due to trauma: Secondary | ICD-10-CM | POA: Diagnosis not present

## 2023-10-10 DIAGNOSIS — W228XXA Striking against or struck by other objects, initial encounter: Secondary | ICD-10-CM | POA: Diagnosis not present

## 2023-10-10 DIAGNOSIS — Z6841 Body Mass Index (BMI) 40.0 and over, adult: Secondary | ICD-10-CM | POA: Diagnosis not present

## 2023-10-10 DIAGNOSIS — J441 Chronic obstructive pulmonary disease with (acute) exacerbation: Secondary | ICD-10-CM | POA: Diagnosis present

## 2023-10-10 DIAGNOSIS — E11649 Type 2 diabetes mellitus with hypoglycemia without coma: Secondary | ICD-10-CM | POA: Diagnosis not present

## 2023-10-10 DIAGNOSIS — S0083XA Contusion of other part of head, initial encounter: Secondary | ICD-10-CM | POA: Insufficient documentation

## 2023-10-10 DIAGNOSIS — E119 Type 2 diabetes mellitus without complications: Secondary | ICD-10-CM | POA: Insufficient documentation

## 2023-10-10 DIAGNOSIS — I452 Bifascicular block: Secondary | ICD-10-CM | POA: Diagnosis present

## 2023-10-10 DIAGNOSIS — A4189 Other specified sepsis: Secondary | ICD-10-CM | POA: Diagnosis present

## 2023-10-10 DIAGNOSIS — E66813 Obesity, class 3: Secondary | ICD-10-CM | POA: Diagnosis not present

## 2023-10-10 DIAGNOSIS — E1142 Type 2 diabetes mellitus with diabetic polyneuropathy: Secondary | ICD-10-CM | POA: Diagnosis present

## 2023-10-10 DIAGNOSIS — R0602 Shortness of breath: Secondary | ICD-10-CM | POA: Diagnosis not present

## 2023-10-10 DIAGNOSIS — G40909 Epilepsy, unspecified, not intractable, without status epilepticus: Secondary | ICD-10-CM | POA: Diagnosis present

## 2023-10-10 DIAGNOSIS — J1000 Influenza due to other identified influenza virus with unspecified type of pneumonia: Secondary | ICD-10-CM | POA: Diagnosis present

## 2023-10-10 DIAGNOSIS — J9601 Acute respiratory failure with hypoxia: Secondary | ICD-10-CM | POA: Diagnosis present

## 2023-10-10 DIAGNOSIS — I110 Hypertensive heart disease with heart failure: Secondary | ICD-10-CM | POA: Diagnosis not present

## 2023-10-10 DIAGNOSIS — I5033 Acute on chronic diastolic (congestive) heart failure: Secondary | ICD-10-CM | POA: Diagnosis not present

## 2023-10-10 DIAGNOSIS — G8929 Other chronic pain: Secondary | ICD-10-CM | POA: Diagnosis not present

## 2023-10-10 DIAGNOSIS — Z66 Do not resuscitate: Secondary | ICD-10-CM | POA: Diagnosis present

## 2023-10-10 DIAGNOSIS — I5032 Chronic diastolic (congestive) heart failure: Secondary | ICD-10-CM | POA: Diagnosis present

## 2023-10-10 DIAGNOSIS — J440 Chronic obstructive pulmonary disease with acute lower respiratory infection: Secondary | ICD-10-CM | POA: Diagnosis present

## 2023-10-10 DIAGNOSIS — E162 Hypoglycemia, unspecified: Secondary | ICD-10-CM | POA: Diagnosis not present

## 2023-10-10 DIAGNOSIS — R531 Weakness: Secondary | ICD-10-CM | POA: Diagnosis not present

## 2023-10-10 DIAGNOSIS — Z743 Need for continuous supervision: Secondary | ICD-10-CM | POA: Diagnosis not present

## 2023-10-10 DIAGNOSIS — Y92239 Unspecified place in hospital as the place of occurrence of the external cause: Secondary | ICD-10-CM | POA: Diagnosis not present

## 2023-10-10 DIAGNOSIS — I4891 Unspecified atrial fibrillation: Secondary | ICD-10-CM | POA: Diagnosis not present

## 2023-10-10 DIAGNOSIS — F32A Depression, unspecified: Secondary | ICD-10-CM | POA: Diagnosis present

## 2023-10-10 DIAGNOSIS — Z8673 Personal history of transient ischemic attack (TIA), and cerebral infarction without residual deficits: Secondary | ICD-10-CM | POA: Diagnosis not present

## 2023-10-10 DIAGNOSIS — R918 Other nonspecific abnormal finding of lung field: Secondary | ICD-10-CM | POA: Diagnosis not present

## 2023-10-10 DIAGNOSIS — I130 Hypertensive heart and chronic kidney disease with heart failure and stage 1 through stage 4 chronic kidney disease, or unspecified chronic kidney disease: Secondary | ICD-10-CM | POA: Diagnosis present

## 2023-10-10 DIAGNOSIS — I509 Heart failure, unspecified: Secondary | ICD-10-CM | POA: Diagnosis not present

## 2023-10-10 DIAGNOSIS — Z79899 Other long term (current) drug therapy: Secondary | ICD-10-CM | POA: Diagnosis not present

## 2023-10-10 DIAGNOSIS — J189 Pneumonia, unspecified organism: Secondary | ICD-10-CM | POA: Diagnosis not present

## 2023-10-10 DIAGNOSIS — I480 Paroxysmal atrial fibrillation: Secondary | ICD-10-CM | POA: Diagnosis present

## 2023-10-10 DIAGNOSIS — J101 Influenza due to other identified influenza virus with other respiratory manifestations: Secondary | ICD-10-CM | POA: Diagnosis present

## 2023-10-10 DIAGNOSIS — E1122 Type 2 diabetes mellitus with diabetic chronic kidney disease: Secondary | ICD-10-CM | POA: Diagnosis present

## 2023-10-10 DIAGNOSIS — R6520 Severe sepsis without septic shock: Secondary | ICD-10-CM | POA: Diagnosis present

## 2023-10-10 DIAGNOSIS — E1140 Type 2 diabetes mellitus with diabetic neuropathy, unspecified: Secondary | ICD-10-CM | POA: Diagnosis not present

## 2023-10-10 DIAGNOSIS — E1165 Type 2 diabetes mellitus with hyperglycemia: Secondary | ICD-10-CM | POA: Diagnosis not present

## 2023-10-10 LAB — GLUCOSE, CAPILLARY
Glucose-Capillary: 99 mg/dL (ref 70–99)
Glucose-Capillary: 109 mg/dL — ABNORMAL HIGH (ref 70–99)
Glucose-Capillary: 117 mg/dL — ABNORMAL HIGH (ref 70–99)

## 2023-10-10 MED ORDER — LACOSAMIDE 150 MG PO TABS: 150.0000 mg | ORAL_TABLET | Freq: Two times a day (BID) | ORAL | 0 refills | Status: AC

## 2023-10-10 MED ORDER — METOPROLOL SUCCINATE ER 25 MG PO TB24: 25.0000 mg | ORAL_TABLET | Freq: Every day | ORAL | Status: DC

## 2023-10-10 MED ORDER — PREGABALIN 150 MG PO CAPS: 150.0000 mg | ORAL_CAPSULE | Freq: Three times a day (TID) | ORAL | 0 refills | Status: AC

## 2023-10-10 MED ORDER — POTASSIUM CHLORIDE CRYS ER 20 MEQ PO TBCR
20.0000 meq | EXTENDED_RELEASE_TABLET | ORAL | Status: DC
Start: 2023-10-10 — End: 2024-05-02

## 2023-10-10 MED ORDER — HYDROCODONE-ACETAMINOPHEN 5-325 MG PO TABS: 1.0000 | ORAL_TABLET | ORAL | 0 refills | Status: DC | PRN

## 2023-10-10 NOTE — Plan of Care (Signed)

## 2023-10-10 NOTE — Progress Notes (Signed)
 Orthopedic Tech Progress Note Patient Details:  Lawrence [name] 06/10/1956 [id number] Level 2 Trauma. Not needed Patient ID: Lawrence [name], male   DOB: 1956-07-06, 68 y.o.   MRN: [id number]  Lawrence [name] 10/10/2023, 7:26 PM

## 2023-10-10 NOTE — Progress Notes (Signed)
 Chaplain stepped in for a visit with pt.  Chaplain engaged in ministry of presence while conversing with pt.  Chaplain prayed at bedside.  Vernell Morgans Chaplain

## 2023-10-10 NOTE — TOC Transition Note (Signed)
 Transition of Care [facility]) - Discharge Note   Patient Details  Name: Lawrence [name] MRN: [id number] Date of Birth: October 21, 1955  Transition of Care [facility]) CM/SW Contact:  Carley Hammed, LCSW Phone Number: 10/10/2023, 12:26 PM   Clinical Narrative:    Pt to be transported to [facility] via [facility]. Nurse to call report to [phone]. Rm# [phone]. PASRR approved [id number] F.   Final next level of care: Skilled Nursing Facility Barriers to Discharge: Barriers Resolved   Patient Goals and CMS Choice            Discharge Placement              Patient [facility] bed at:  [facility]) Patient to be transferred to facility by: [facility] Name of family member notified: [name] Patient and family notified of of transfer: 10/10/23  Discharge Plan and Services Additional resources added to the After Visit Summary for   In-house Referral: Clinical Social Work                        HH Arranged: Charity fundraiser, Nurse's Aide HH Agency: [facility] Date HH Agency Contacted: 10/04/23 Time HH Agency Contacted: 1100 Representative spoke with at [facility] Agency: Adele Dan  Social Drivers of Health (SDOH) Interventions SDOH Screenings   Food Insecurity: No Food Insecurity (10/03/2023)  Housing: Low Risk  (10/03/2023)  Transportation Needs: Unmet Transportation Needs (10/03/2023)  Utilities: Not At Risk (10/03/2023)  Alcohol Screen: Low Risk  (06/08/2021)  Financial Resource Strain: Medium Risk (12/06/2021)   Received from [facility], [facility]  Physical Activity: Unknown (12/06/2021)   [facility]  Social Connections: Socially Isolated (10/03/2023)  Stress: No Stress Concern Present (01/21/2022)   Received from [facility], [facility]  Recent Concern: Stress - Stress Concern Present (11/19/2021)   Received from [facility]  Tobacco Use: Medium Risk (10/03/2023)     Readmission Risk Interventions    07/24/2023   11:56 AM 05/08/2023    4:37 PM  01/25/2023    4:23 PM  Readmission Risk Prevention Plan  Transportation Screening Complete Complete Complete  PCP or Specialist Appt within 3-5 Days   Complete  HRI or Home Care Consult   Complete  Social Work Consult for Recovery Care Planning/Counseling   Complete  Palliative Care Screening   Not Applicable  Medication Review Oceanographer) Complete Complete Referral to Pharmacy  PCP or Specialist appointment within 3-5 days of discharge Complete Complete   HRI or Home Care Consult Complete Complete   SW Recovery Care/Counseling Consult Complete Complete   Palliative Care Screening Not Applicable Complete   Skilled Nursing Facility Complete Not Applicable

## 2023-10-10 NOTE — Discharge Instructions (Signed)
 The x-ray today looks normal.

## 2023-10-10 NOTE — Progress Notes (Signed)
 Report given to Nurse Darius at [facility].

## 2023-10-10 NOTE — ED Triage Notes (Signed)
 Pt BIB [facility] from [facility]. Pt was d/c today going to the facility for rehab. [facility] was unloading the pt when the stretcher turned over. Staff lowered it to the ground. Pt did hit head on the door and knee on the ground. Pt does take eliquis.  Pt c/o L side head pain.

## 2023-10-10 NOTE — Discharge Summary (Addendum)
 Physician Discharge Summary   Patient: Lawrence [name] MRN: [id number] DOB: April 20, 1956  Admit date:     10/02/2023  Discharge date: 10/10/23  Discharge Physician: Jacquelin Hawking, MD   PCP: [name], PA-C   Recommendations at discharge:  PCP visit for hospital follow-up Neurology follow-up for spinal stenosis Repeat BMP in 3-5 days  Discharge Diagnoses: Principal Problem:   Hypoglycemia Active Problems:   Acute metabolic encephalopathy   Chronic diastolic CHF (congestive heart failure) (HCC)   DM2 (diabetes mellitus, type 2) (HCC)   SIRS (systemic inflammatory response syndrome) (HCC)   History of COPD   History of seizures   Altered mental status   MDD (major depressive disorder), recurrent severe, without psychosis (HCC)  Resolved Problems:   * No resolved hospital problems. *  Hospital Course: Lawrence [name] is a 68 y.o. male with a history of diabetes mellitus type 2, chronic diastolic heart failure, COPD, severe aortic stenosis s/p TAVR, seizure disorder, depression, anxiety, diastolic heart failure, morbid obesity.  Patient presented secondary to hypoglycemia with recurrent episodes. Physical therapy recommending SNF on discharge.  Assessment and Plan:  Recurrent hypoglycemia In setting of insulin use for diabetes mellitus. Patient has recently had an increase in basal insulin by 5 units per history. Patient received D50 and home [place] held. Hypoglycemia resolved.   Diabetes mellitus type 2 Uncontrolled with hypoglycemia. Patient is on Tresiba, Prandin, Novolog and [place] as an outpatient, which was held. Continue Prandin and Mounjaro. Hold insulin.   Acute metabolic encephalopathy Secondary to hypoglycemia. Resolved.   Paroxysmal atrial fibrillation Currently in sinus rhythm. Patient is on Eliquis and Toprol-XL as an outpatient. Continue Eliquis and Toprol XL; Toprol XL decreased to 25 mg secondary to possible sinus bradycardia, in addition to first  degree block.   SIRS No infectious source identified. Presentation seems likely related to hypoglycemia.   Chronic diastolic heart failure Continue torsemide and spironolactone.   History of seizures Continue Keppra   Severe aortic stenosis s/p TAVR Noted.   Peripheral neuropathy Chronic pain Patient has a spinal cord stimulator. Continue Lyrica.   Generalized weakness Patient with upper and lower extremity weakness. No back/neck pain. No injuries noted. Patient states this has been for the past week, however on chart review, it appears he has been worked up for similar symptoms in the past. MRI from 2022 shows arthritis and mild spinal canal stenosis. Patient also with history of lumbar MRI significant for small central disc protrusion; unclear if he has worsening disease leading to lower extremity weakness. Repeat MRI cervical and lumbar spine shows severe cervical spine stenosis. Recommend for patient to follow-up with neurosurgery as an outpatient.   History of CVA Left hemiplegia Noted. Patient is on Coumadin for paroxysmal atrial fibrillation. PT/OT recommending SNF.   Anxiety Depression Continue Remeron, Buspar and Abilify.   BPH Continue prazosin.   COPD Continue outpatient regimen.   Morbid obesity, class III Estimated body mass index is 44.33 kg/m as calculated from the following:   Height as of this encounter: 6\' 2"  (1.88 m).   Weight as of this encounter: 156.6 kg.   Consultants:  None   Procedures:  None  Disposition: Skilled nursing facility Diet recommendation: Carb modified diet   DISCHARGE MEDICATION: Allergies as of 10/10/2023       Reactions   Niacin And Related Other (See Comments)   Red man syndrome   Tubersol [tuberculin, Ppd] Other (See Comments)   Reaction unknown   Doxycycline Rash, Other (See Comments)  Vancomycin Other (See Comments)   Vancomycin flushing reaction 02/02/17        Medication List     STOP taking these  medications    HumaLOG 100 UNIT/ML injection Generic drug: insulin lispro   insulin aspart 100 UNIT/ML injection Commonly known as: novoLOG   traMADol 50 MG tablet Commonly known as: Vedia Pereyra FlexTouch 100 UNIT/ML FlexTouch Pen Generic drug: insulin degludec       TAKE these medications    acetaminophen 650 MG CR tablet Commonly known as: TYLENOL Take 1 tablet (650 mg total) by mouth every 8 (eight) hours as needed for pain.   albuterol 108 (90 Base) MCG/ACT inhaler Commonly known as: VENTOLIN HFA Inhale 2 puffs into the lungs every 6 (six) hours as needed for wheezing or shortness of breath.   allopurinol 100 MG tablet Commonly known as: Zyloprim Take 1 tablet (100 mg total) by mouth daily.   apixaban 5 MG Tabs tablet Commonly known as: Eliquis Take 1 tablet (5 mg total) by mouth 2 (two) times daily.   ARIPiprazole 5 MG tablet Commonly known as: ABILIFY TAKE 1 TABLET BY MOUTH EVERYDAY AT BEDTIME   atorvastatin 40 MG tablet Commonly known as: LIPITOR Take 40 mg by mouth at bedtime. What changed: Another medication with the same name was removed. Continue taking this medication, and follow the directions you see here.   bisacodyl 5 MG EC tablet Commonly known as: DULCOLAX Take 2 tablets (10 mg total) by mouth daily as needed for moderate constipation.   busPIRone 10 MG tablet Commonly known as: BUSPAR Take 1 tablet (10 mg total) by mouth 3 (three) times daily.   colchicine 0.6 MG tablet Take 1 tablet (0.6 mg total) by mouth daily.   cyanocobalamin 1000 MCG tablet Commonly known as: VITAMIN B12 Take 1,000 mcg by mouth daily.   fenofibrate 145 MG tablet Commonly known as: Tricor Take 1 tablet (145 mg total) by mouth daily.   ferrous sulfate 324 (65 Fe) MG Tbec Take 324 mg by mouth daily with breakfast.   fluticasone 50 MCG/ACT nasal spray Commonly known as: FLONASE Place 2 sprays into both nostrils daily.   HYDROcodone-acetaminophen 5-325 MG  tablet Commonly known as: NORCO/VICODIN Take 1 tablet by mouth every 4 (four) hours as needed for moderate pain (pain score 4-6) or severe pain (pain score 7-10).   icosapent Ethyl 1 g capsule Commonly known as: Vascepa Take 2 capsules (2 g total) by mouth 2 (two) times daily.   Lacosamide 150 MG Tabs Take 1 tablet (150 mg total) by mouth 2 (two) times daily.   levETIRAcetam 500 MG tablet Commonly known as: KEPPRA Take 1 tablet (500 mg total) by mouth 2 (two) times daily.   methocarbamol 500 MG tablet Commonly known as: ROBAXIN Take 1 tablet (500 mg total) by mouth every 8 (eight) hours as needed for muscle spasms.   metolazone 2.5 MG tablet Commonly known as: ZAROXOLYN Take 2.5 mg by mouth once a week.   metoprolol succinate 25 MG 24 hr tablet Commonly known as: TOPROL-XL Take 1 tablet (25 mg total) by mouth daily. What changed:  medication strength how much to take   mirtazapine 15 MG tablet Commonly known as: REMERON Take 15 mg by mouth at bedtime.   Mounjaro 15 MG/0.5ML Pen Generic drug: tirzepatide INJECT 15 MG INTO THE SKIN ONCE A WEEK. What changed: See the new instructions.   mupirocin ointment 2 % Commonly known as: BACTROBAN Apply 1 Application topically.  Muscle Rub 10-15 % Crea Apply 1 Application topically as needed for muscle pain (to knees for pain).   omeprazole 20 MG capsule Commonly known as: PRILOSEC Take 20 mg by mouth every morning.   polyethylene glycol powder 17 GM/SCOOP powder Commonly known as: GLYCOLAX/MIRALAX Mix in water as directed and take 1 capful (17 g) by mouth daily.   potassium chloride SA 20 MEQ tablet Commonly known as: Klor-Con M20 Take 1-2 tablets (20-40 mEq total) by mouth See admin instructions. Take 2 tablets in the morning and 1 tablet in the evening   prazosin 2 MG capsule Commonly known as: MINIPRESS Take 2 mg by mouth at bedtime.   pregabalin 150 MG capsule Commonly known as: LYRICA Take 1 capsule (150 mg  total) by mouth 3 (three) times daily.   repaglinide 2 MG tablet Commonly known as: PRANDIN Take 4 mg by mouth 2 (two) times daily.   rOPINIRole 0.25 MG tablet Commonly known as: REQUIP Take 1 tablet (0.25 mg total) by mouth 2 (two) times daily.   senna-docusate 8.6-50 MG tablet Commonly known as: Senokot-S Take 2 tablets by mouth 2 (two) times daily.   spironolactone 25 MG tablet Commonly known as: ALDACTONE Take 12.5 mg by mouth daily.   torsemide 20 MG tablet Commonly known as: DEMADEX Take 3 tablets (60 mg total) by mouth 2 (two) times daily.   traZODone 100 MG tablet Commonly known as: DESYREL Take 200 mg by mouth at bedtime.   Trelegy Ellipta 200-62.5-25 MCG/ACT Aepb Generic drug: Fluticasone-Umeclidin-Vilant INHALE 1 PUFF BY MOUTH INTO LUNGS ONCE DAILY   venlafaxine XR 75 MG 24 hr capsule Commonly known as: EFFEXOR-XR Take 2 capsules (150 mg total) by mouth daily with breakfast.        Contact information for follow-up providers     [name], PA-C Follow up.   Specialty: Family Medicine Why: For hospital follow-up Contact information: [address] [place] [address] [phone]         Dawley, Alan Mulder, DO Follow up.   Why: Neurosurgery follwo-up for cervical and lumbar stenosis Contact information: [address] [place] [address] [place] [address] [phone]              Contact information for after-discharge care     Destination     [facility] .   Service: Skilled Paramedic information: [address] [place] [address] [phone]                    Discharge Exam: BP 125/80 (BP Location: Other (Comment)) Comment (BP Location): right forearm  Pulse 70   Temp 98.1 F (36.7 C) (Oral)   Resp 18   Ht 6\' 2"  (1.88 m)   Wt (!) 156.6 kg   SpO2 95%   BMI 44.33 kg/m   General exam: Appears calm and comfortable Respiratory system: Clear to auscultation.  Respiratory effort normal. Cardiovascular system: S1 & S2 heard, RRR. Gastrointestinal system: Abdomen is nondistended, soft and nontender. Normal bowel sounds heard. Central nervous system: Alert and oriented. Psychiatry: Judgement and insight appear normal. Mood & affect appropriate.   Condition at discharge: stable  The results of significant diagnostics from this hospitalization (including imaging, microbiology, ancillary and laboratory) are listed below for reference.   Imaging Studies: MR LUMBAR SPINE WO CONTRAST Result Date: 10/09/2023 CLINICAL DATA:  Spinal stenosis, cervical; Lumbar radiculopathy, no red flags, no prior management. EXAM: MRI CERVICAL AND LUMBAR SPINE WITHOUT CONTRAST TECHNIQUE: Multiplanar  and multiecho pulse sequences of the cervical spine, to include the craniocervical junction and cervicothoracic junction, and lumbar spine, were obtained without intravenous contrast. COMPARISON:  None Available. FINDINGS: MRI CERVICAL SPINE FINDINGS Alignment: No substantial sagittal subluxation. Vertebrae: No fracture, evidence of discitis, or bone lesion. Cord: Normal cord signal. Posterior Fossa, vertebral arteries, paraspinal tissues: Negative. Disc levels: C2-C3: Left facet and uncovertebral hypertrophy with moderate to severe left foraminal stenosis. Patent canal and right foramen. C3-C4: Posterior disc osteophyte complex and ligamentum flavum thickening. Bilateral facet and uncovertebral hypertrophy. Resulting severe canal stenosis and severe right greater than left foraminal stenosis. C4-C5: Right greater than left facet and uncovertebral hypertrophy. Resulting severe right and moderate left foraminal stenosis. Patent canal. C5-C6: Posterior disc osteophyte complex. Bilateral facet uncovertebral hypertrophy. Severe bilateral foraminal stenosis. Mild canal stenosis. C6-C7: Left greater than right facet and uncovertebral hypertrophy. Resulting moderate to severe bilateral foraminal  stenosis. Patent canal. C7-T1: Bilateral uncovertebral hypertrophy. Resulting mild bilateral foraminal stenosis. Patent canal. MRI LUMBAR SPINE FINDINGS Motion limited study. Also, incomplete study without axial T1. Within this limitation: Segmentation: Standard. Alignment:  No substantial sagittal subluxation. Vertebrae: Mild edema along the anterior/superior L3 vertebral body is likely degenerative in etiology. No specific evidence of acute fracture or discitis/osteomyelitis. Conus medullaris and cauda equina: Conus extends to the L1 level. Conus appears normal. Paraspinal and other soft tissues: Unremarkable. Disc levels: T12-L1: No significant disc protrusion, foraminal stenosis, or canal stenosis. L1-L2: No significant disc protrusion, foraminal stenosis, or canal stenosis. L2-L3: Mild disc bulge.  No significant stenosis. L3-L4: No significant disc protrusion, foraminal stenosis, or canal stenosis. L4-L5: Small central disc protrusion. No significant canal or foraminal stenosis. L5-S1: Mild broad disc bulging. Bilateral facet arthropathy. Moderate right and mild left foraminal stenosis. Patent canal. IMPRESSION: MRI CERVICAL SPINE: 1. Severe canal stenosis at C3-C4. 2. Severe foraminal stenosis on the right at C4-C5 and bilaterally at C3-C4 and C5-C6. 3. Moderate to severe foraminal stenosis on the left at C2-C3 and bilaterally at C6-C7. MRI LUMBAR SPINE: Moderate right and mild left foraminal stenosis at L5-S1. Electronically Signed   By: Feliberto Harts M.D.   On: 10/09/2023 23:25   MR CERVICAL SPINE WO CONTRAST Result Date: 10/09/2023 CLINICAL DATA:  Spinal stenosis, cervical; Lumbar radiculopathy, no red flags, no prior management. EXAM: MRI CERVICAL AND LUMBAR SPINE WITHOUT CONTRAST TECHNIQUE: Multiplanar and multiecho pulse sequences of the cervical spine, to include the craniocervical junction and cervicothoracic junction, and lumbar spine, were obtained without intravenous contrast. COMPARISON:   None Available. FINDINGS: MRI CERVICAL SPINE FINDINGS Alignment: No substantial sagittal subluxation. Vertebrae: No fracture, evidence of discitis, or bone lesion. Cord: Normal cord signal. Posterior Fossa, vertebral arteries, paraspinal tissues: Negative. Disc levels: C2-C3: Left facet and uncovertebral hypertrophy with moderate to severe left foraminal stenosis. Patent canal and right foramen. C3-C4: Posterior disc osteophyte complex and ligamentum flavum thickening. Bilateral facet and uncovertebral hypertrophy. Resulting severe canal stenosis and severe right greater than left foraminal stenosis. C4-C5: Right greater than left facet and uncovertebral hypertrophy. Resulting severe right and moderate left foraminal stenosis. Patent canal. C5-C6: Posterior disc osteophyte complex. Bilateral facet uncovertebral hypertrophy. Severe bilateral foraminal stenosis. Mild canal stenosis. C6-C7: Left greater than right facet and uncovertebral hypertrophy. Resulting moderate to severe bilateral foraminal stenosis. Patent canal. C7-T1: Bilateral uncovertebral hypertrophy. Resulting mild bilateral foraminal stenosis. Patent canal. MRI LUMBAR SPINE FINDINGS Motion limited study. Also, incomplete study without axial T1. Within this limitation: Segmentation: Standard. Alignment:  No substantial sagittal subluxation. Vertebrae: Mild edema along  the anterior/superior L3 vertebral body is likely degenerative in etiology. No specific evidence of acute fracture or discitis/osteomyelitis. Conus medullaris and cauda equina: Conus extends to the L1 level. Conus appears normal. Paraspinal and other soft tissues: Unremarkable. Disc levels: T12-L1: No significant disc protrusion, foraminal stenosis, or canal stenosis. L1-L2: No significant disc protrusion, foraminal stenosis, or canal stenosis. L2-L3: Mild disc bulge.  No significant stenosis. L3-L4: No significant disc protrusion, foraminal stenosis, or canal stenosis. L4-L5: Small central  disc protrusion. No significant canal or foraminal stenosis. L5-S1: Mild broad disc bulging. Bilateral facet arthropathy. Moderate right and mild left foraminal stenosis. Patent canal. IMPRESSION: MRI CERVICAL SPINE: 1. Severe canal stenosis at C3-C4. 2. Severe foraminal stenosis on the right at C4-C5 and bilaterally at C3-C4 and C5-C6. 3. Moderate to severe foraminal stenosis on the left at C2-C3 and bilaterally at C6-C7. MRI LUMBAR SPINE: Moderate right and mild left foraminal stenosis at L5-S1. Electronically Signed   By: Feliberto Harts M.D.   On: 10/09/2023 23:25   CT CHEST ABDOMEN PELVIS W CONTRAST Result Date: 10/02/2023 CLINICAL DATA:  Sepsis EXAM: CT CHEST, ABDOMEN, AND PELVIS WITH CONTRAST TECHNIQUE: Multidetector CT imaging of the chest, abdomen and pelvis was performed following the standard protocol during bolus administration of intravenous contrast. RADIATION DOSE REDUCTION: This exam was performed according to the departmental dose-optimization program which includes automated exposure control, adjustment of the mA and/or kV according to patient size and/or use of iterative reconstruction technique. CONTRAST:  75mL OMNIPAQUE IOHEXOL 350 MG/ML SOLN COMPARISON:  CTA chest abdomen pelvis dated 01/27/2023 FINDINGS: CT CHEST FINDINGS Cardiovascular: The heart is normal in size. No pericardial effusion. Status post TAVR. No evidence of thoracic aortic aneurysm. Atherosclerotic calcifications of the aortic arch. Moderate three-vessel coronary atherosclerosis. Mediastinum/Nodes: No suspicious mediastinal lymphadenopathy. Visualized thyroid is unremarkable. Lungs/Pleura: Mild centrilobular and paraseptal emphysematous changes, upper lung predominant. No suspicious pulmonary nodules. Mild scarring/atelectasis in the bilateral lower lobes. No focal consolidation. No pleural effusion pneumothorax. Musculoskeletal: Degenerative changes of the thoracic spine. CT ABDOMEN PELVIS FINDINGS Hepatobiliary: Mild  hepatic steatosis. 4.1 cm inferior right hepatic lobe cyst (series 3/image 60). Gallbladder is unremarkable. No intrahepatic or extrahepatic ductal dilatation. Pancreas: Within normal limits. Spleen: Within normal limits. Adrenals/Urinary Tract: Adrenal glands are within normal limits. Left kidney is within normal limits. Right lower pole renal cysts measuring up to 2.8 cm (series 3/image 30), benign (Bosniak I). No follow-up is recommended. Bladder is normal limits. Stomach/Bowel: Stomach is within normal limits. No evidence of bowel obstruction. Small duodenal diverticulum. Normal appendix (series 3/image 94). Mild left colonic diverticulosis, without evidence of diverticulitis. Mild to moderate left colonic stool burden. Vascular/Lymphatic: No evidence of abdominal aortic aneurysm. Atherosclerotic calcifications of the abdominal aorta and branch vessels, although vessels remain patent. No suspicious abdominopelvic lymphadenopathy. Reproductive: Prostate is unremarkable. Other: No abdominopelvic ascites. Musculoskeletal: Mild degenerative changes of the lumbar spine. Spinal stimulator at T12-L1. IMPRESSION: No acute cardiopulmonary disease. Mild left colonic diverticulosis, without evidence of diverticulitis. Additional ancillary findings as above. Aortic Atherosclerosis (ICD10-I70.0) and Emphysema (ICD10-J43.9). Electronically Signed   By: Charline Bills M.D.   On: 10/02/2023 21:32   CT Head Wo Contrast Result Date: 10/02/2023 CLINICAL DATA:  Altered mental status and lethargy. Recent hospitalization for atrial fibrillation, declining strength in mentation since. History of stroke with left-sided weakness. EXAM: CT HEAD WITHOUT CONTRAST TECHNIQUE: Contiguous axial images were obtained from the base of the skull through the vertex without intravenous contrast. RADIATION DOSE REDUCTION: This exam was performed according to  the departmental dose-optimization program which includes automated exposure control,  adjustment of the mA and/or kV according to patient size and/or use of iterative reconstruction technique. COMPARISON:  MRI brain 07/14/2023.  CT head 07/25/2023 FINDINGS: Brain: No acute intracranial hemorrhage. No CT evidence of acute infarct. Nonspecific hypoattenuation in the periventricular and subcortical white matter favored to reflect chronic microvascular ischemic changes. No edema, mass effect, or midline shift. The basilar cisterns are patent. Ventricles: The ventricles are normal. Vascular: No hyperdense vessel or unexpected calcification. Skull: No acute or aggressive finding. Orbits: Orbits are symmetric. Sinuses: Mucosal thickening in the ethmoid sinuses, right greater than left. Additional mucosal thickening in the sphenoid sinuses with small air-fluid level in the left sphenoid sinus. Mucosal thickening in the left maxillary sinus. Other: Mastoid air cells are clear. IMPRESSION: 1. No acute intracranial process. 2. Mild chronic microvascular ischemic changes. 3. Paranasal sinus disease with small air-fluid level in the left sphenoid sinus. Correlate for acute sinusitis. Electronically Signed   By: Emily Filbert M.D.   On: 10/02/2023 16:30   DG Chest Port 1 View Result Date: 10/02/2023 CLINICAL DATA:  Altered mental status. EXAM: PORTABLE CHEST 1 VIEW COMPARISON:  09/30/2023. FINDINGS: Low lung volume. There is diffuse moderate vascular congestion, which is accentuated by low lung volume. Bilateral lung fields are otherwise clear. Bilateral costophrenic angles are clear. Stable cardio-mediastinal silhouette. No acute osseous abnormalities. The soft tissues are within normal limits. IMPRESSION: *Moderate diffuse pulmonary vascular congestion, accentuated by low lung volume. Electronically Signed   By: Jules Schick M.D.   On: 10/02/2023 14:21   DG Chest 2 View Result Date: 09/30/2023 CLINICAL DATA:  Chest pain EXAM: CHEST - 2 VIEW COMPARISON:  07/21/2023 FINDINGS: Lungs are clear.  No pleural  effusion or pneumothorax. The heart is top normal in size. Degenerative changes of the visualized thoracolumbar spine. IMPRESSION: Normal chest radiographs. Electronically Signed   By: Charline Bills M.D.   On: 09/30/2023 19:39    Microbiology: Results for orders placed or performed during the hospital encounter of 10/02/23  Resp panel by RT-PCR (RSV, Flu A&B, Covid) Anterior Nasal Swab     Status: None   Collection Time: 10/02/23  6:50 PM   Specimen: Anterior Nasal Swab  Result Value Ref Range Status   SARS Coronavirus 2 by RT PCR NEGATIVE NEGATIVE Final   Influenza A by PCR NEGATIVE NEGATIVE Final   Influenza B by PCR NEGATIVE NEGATIVE Final    Comment: (NOTE) The Xpert Xpress SARS-CoV-2/FLU/RSV plus assay is intended as an aid in the diagnosis of influenza from Nasopharyngeal swab specimens and should not be used as a sole basis for treatment. Nasal washings and aspirates are unacceptable for Xpert Xpress SARS-CoV-2/FLU/RSV testing.  Fact Sheet for Patients: BloggerCourse.com  Fact Sheet for Healthcare Providers: SeriousBroker.it  This test is not yet approved or cleared by the [place] FDA and has been authorized for detection and/or diagnosis of SARS-CoV-2 by FDA under an Emergency Use Authorization (EUA). This EUA will remain in effect (meaning this test can be used) for the duration of the COVID-19 declaration under Section 564(b)(1) of the Act, 21 U.S.C. section 360bbb-3(b)(1), unless the authorization is terminated or revoked.     Resp Syncytial Virus by PCR NEGATIVE NEGATIVE Final    Comment: (NOTE) Fact Sheet for Patients: BloggerCourse.com  Fact Sheet for Healthcare Providers: SeriousBroker.it  This test is not yet approved or cleared by the [place] FDA and has been authorized for detection and/or diagnosis of SARS-CoV-2  by FDA under an Emergency Use  Authorization (EUA). This EUA will remain in effect (meaning this test can be used) for the duration of the COVID-19 declaration under Section 564(b)(1) of the Act, 21 U.S.C. section 360bbb-3(b)(1), unless the authorization is terminated or revoked.  Performed at [facility], [address] [place], [place] [address]     Labs:  CBG: Recent Labs  Lab 10/09/23 1151 10/09/23 1616 10/09/23 2120 10/10/23 0734 10/10/23 1123  GLUCAP 105* 92 137* 99 109*    Discharge time spent: 35 minutes.  Signed: Jacquelin Hawking, MD [facility] 10/10/2023

## 2023-10-10 NOTE — ED Notes (Signed)
 [facility] called

## 2023-10-10 NOTE — ED Provider Notes (Signed)
 [place] EMERGENCY DEPARTMENT AT [facility] Provider Note   CSN: [id number] Arrival date & time: 10/10/23  1905     History  Chief Complaint  Patient presents with   Lawrence [name] is a 68 y.o. male.  Pt is a 68 y.o. male with a history of diabetes mellitus type 2, chronic diastolic heart failure, COPD, severe aortic stenosis s/p TAVR, seizure disorder, depression, anxiety, diastolic heart failure, morbid obesity who was just discharged from the hospital today and is returning due to head trauma.  It was reported that patient was going back by P [facility] to [facility] and this stretcher hit an uneven portion of the floor and tipped the patient out.  They reported that they were slowly lowering him to the floor but his head hit the door frame on the left side pretty hard.  He scraped his right knee as well.  There was no loss of consciousness and patient reports overall he feels okay just has a mild headache on the left side.  He does take Eliquis.  The history is provided by the patient and the EMS personnel.  Fall       Home Medications Prior to Admission medications   Medication Sig Start Date End Date Taking? Authorizing Provider  albuterol (VENTOLIN HFA) 108 (90 Base) MCG/ACT inhaler Inhale 2 puffs into the lungs every 6 (six) hours as needed for wheezing or shortness of breath. 04/17/23  Yes Hunsucker, Lesia Sago, MD  allopurinol (ZYLOPRIM) 100 MG tablet Take 1 tablet (100 mg total) by mouth daily. 07/25/23 07/24/24 Yes Elgergawy, Leana Roe, MD  apixaban (ELIQUIS) 5 MG TABS tablet Take 1 tablet (5 mg total) by mouth 2 (two) times daily. 04/17/23  Yes Laurey Morale, MD  ARIPiprazole (ABILIFY) 5 MG tablet TAKE 1 TABLET BY MOUTH EVERYDAY AT BEDTIME 10/03/23  Yes Mozingo, Thereasa Solo, NP  atorvastatin (LIPITOR) 40 MG tablet Take 40 mg by mouth at bedtime. 07/14/23  Yes [provider]  busPIRone (BUSPAR) 10 MG tablet Take 1 tablet (10 mg total) by mouth 3  (three) times daily. 04/17/23  Yes Mozingo, Thereasa Solo, NP  colchicine 0.6 MG tablet Take 1 tablet (0.6 mg total) by mouth daily. 07/12/23  Yes Love, Evlyn Kanner, PA-C  cyanocobalamin (VITAMIN B12) 1000 MCG tablet Take 1,000 mcg by mouth daily.   Yes [provider]  fenofibrate (TRICOR) 145 MG tablet Take 1 tablet (145 mg total) by mouth daily. 04/17/23  Yes Laurey Morale, MD  ferrous sulfate 324 (65 Fe) MG TBEC Take 324 mg by mouth daily with breakfast.    Yes [provider]  fluticasone (FLONASE) 50 MCG/ACT nasal spray Place 2 sprays into both nostrils daily.    Yes [provider]  Fluticasone-Umeclidin-Vilant (TRELEGY ELLIPTA) 200-62.5-25 MCG/ACT AEPB INHALE 1 PUFF BY MOUTH INTO LUNGS ONCE DAILY 08/18/22  Yes Hunsucker, Lesia Sago, MD  icosapent Ethyl (VASCEPA) 1 g capsule Take 2 capsules (2 g total) by mouth 2 (two) times daily. 04/17/23  Yes Laurey Morale, MD  Lacosamide 150 MG TABS Take 1 tablet (150 mg total) by mouth 2 (two) times daily. 10/10/23  Yes Narda Bonds, MD  levETIRAcetam (KEPPRA) 500 MG tablet Take 1 tablet (500 mg total) by mouth 2 (two) times daily. 05/04/23  Yes Camara, Amalia Hailey, MD  metolazone (ZAROXOLYN) 2.5 MG tablet Take 2.5 mg by mouth once a week. 09/20/23  Yes [provider]  metoprolol succinate (TOPROL-XL) 25  MG 24 hr tablet Take 1 tablet (25 mg total) by mouth daily. 10/10/23  Yes Narda Bonds, MD  mirtazapine (REMERON) 15 MG tablet Take 15 mg by mouth at bedtime. 07/14/23  Yes [provider]  mupirocin ointment (BACTROBAN) 2 % Apply 1 Application topically 2 (two) times daily. 09/20/23  Yes [provider]  omeprazole (PRILOSEC) 20 MG capsule Take 20 mg by mouth daily. 05/29/20  Yes [provider]  polyethylene glycol powder (GLYCOLAX/MIRALAX) 17 GM/SCOOP powder Mix in water as directed and take 1 capful (17 g) by mouth daily. Patient taking differently: Take 17 g by mouth daily as needed (constipation).  07/12/23  Yes Love, Evlyn Kanner, PA-C  potassium chloride SA (KLOR-CON M20) 20 MEQ tablet Take 1-2 tablets (20-40 mEq total) by mouth See admin instructions. Take 2 tablets in the morning and 1 tablet in the evening 10/10/23  Yes Narda Bonds, MD  prazosin (MINIPRESS) 2 MG capsule Take 2 mg by mouth at bedtime. 08/17/23  Yes [provider]  pregabalin (LYRICA) 150 MG capsule Take 1 capsule (150 mg total) by mouth 3 (three) times daily. 10/10/23  Yes Narda Bonds, MD  repaglinide (PRANDIN) 2 MG tablet Take 4 mg by mouth 2 (two) times daily. 07/24/23  Yes [provider]  rOPINIRole (REQUIP) 0.25 MG tablet Take 1 tablet (0.25 mg total) by mouth 2 (two) times daily. 07/25/23  Yes Elgergawy, Leana Roe, MD  spironolactone (ALDACTONE) 25 MG tablet Take 12.5 mg by mouth daily. 09/01/23  Yes [provider]  tirzepatide Greggory Keen) 15 MG/0.5ML Pen INJECT 15 MG INTO THE SKIN ONCE A WEEK. Patient taking differently: 15 mg once a week. Inject on Monday 06/07/23  Yes Motwani, Komal, MD  torsemide (DEMADEX) 20 MG tablet Take 3 tablets (60 mg total) by mouth 2 (two) times daily. 07/25/23  Yes Elgergawy, Leana Roe, MD  traZODone (DESYREL) 100 MG tablet Take 200 mg by mouth at bedtime. 09/10/23  Yes [provider]  HYDROcodone-acetaminophen (NORCO/VICODIN) 5-325 MG tablet Take 1 tablet by mouth every 4 (four) hours as needed for moderate pain (pain score 4-6) or severe pain (pain score 7-10). Patient not taking: Reported on 10/10/2023 10/10/23   Narda Bonds, MD      Allergies    Niacin and related; Tubersol [tuberculin, ppd]; Firvanq [vancomycin]; and Vibramycin [doxycycline]    Review of Systems   Review of Systems  Physical Exam Updated Vital Signs BP (!) 109/55   Pulse 78   Resp 13   Ht 6\' 2"  (1.88 m)   Wt (!) 157 kg   SpO2 95%   BMI 44.44 kg/m  Physical Exam Vitals and nursing note reviewed.  Constitutional:      General: He is not in acute distress.    Appearance:  He is well-developed.  HENT:     Head: Normocephalic and atraumatic.   Eyes:     Conjunctiva/sclera: Conjunctivae normal.     Pupils: Pupils are equal, round, and reactive to light.  Cardiovascular:     Rate and Rhythm: Normal rate and regular rhythm.     Heart sounds: No murmur heard. Pulmonary:     Effort: Pulmonary effort is normal. No respiratory distress.     Breath sounds: Normal breath sounds. No wheezing or rales.  Abdominal:     General: There is no distension.     Palpations: Abdomen is soft.     Tenderness: There is no abdominal tenderness. There is no guarding or  rebound.  Musculoskeletal:        General: No tenderness. Normal range of motion.     Cervical back: Normal range of motion and neck supple.       Legs:  Skin:    General: Skin is warm and dry.     Findings: No erythema or rash.  Neurological:     Mental Status: He is alert and oriented to person, place, and time.  Psychiatric:        Behavior: Behavior normal.     ED Results / Procedures / Treatments   Labs (all labs ordered are listed, but only abnormal results are displayed) Labs Reviewed - No data to display  EKG None  Radiology CT Head Wo Contrast Result Date: 10/10/2023 CLINICAL DATA:  Status post fall. EXAM: CT HEAD WITHOUT CONTRAST TECHNIQUE: Contiguous axial images were obtained from the base of the skull through the vertex without intravenous contrast. RADIATION DOSE REDUCTION: This exam was performed according to the departmental dose-optimization program which includes automated exposure control, adjustment of the mA and/or kV according to patient size and/or use of iterative reconstruction technique. COMPARISON:  October 02, 2023 FINDINGS: Brain: There is mild cerebral atrophy with widening of the extra-axial spaces and ventricular dilatation. There are areas of decreased attenuation within the white matter tracts of the supratentorial brain, consistent with microvascular disease changes.  Vascular: No hyperdense vessel or unexpected calcification. Skull: Normal. Negative for fracture or focal lesion. Sinuses/Orbits: There is moderate severity left-sided sphenoid sinus and bilateral ethmoid sinus mucosal thickening. Mild left maxillary sinus mucosal thickening is also seen with a subcentimeter left maxillary sinus polyp versus mucous retention cyst. Other: None. IMPRESSION: 1. No acute intracranial abnormality. 2. Generalized cerebral atrophy with widening of the extra-axial spaces and ventricular dilatation. 3. Moderate severity left-sided sphenoid sinus and bilateral ethmoid sinus disease. Electronically Signed   By: Aram Candela M.D.   On: 10/10/2023 21:32   MR LUMBAR SPINE WO CONTRAST Result Date: 10/09/2023 CLINICAL DATA:  Spinal stenosis, cervical; Lumbar radiculopathy, no red flags, no prior management. EXAM: MRI CERVICAL AND LUMBAR SPINE WITHOUT CONTRAST TECHNIQUE: Multiplanar and multiecho pulse sequences of the cervical spine, to include the craniocervical junction and cervicothoracic junction, and lumbar spine, were obtained without intravenous contrast. COMPARISON:  None Available. FINDINGS: MRI CERVICAL SPINE FINDINGS Alignment: No substantial sagittal subluxation. Vertebrae: No fracture, evidence of discitis, or bone lesion. Cord: Normal cord signal. Posterior Fossa, vertebral arteries, paraspinal tissues: Negative. Disc levels: C2-C3: Left facet and uncovertebral hypertrophy with moderate to severe left foraminal stenosis. Patent canal and right foramen. C3-C4: Posterior disc osteophyte complex and ligamentum flavum thickening. Bilateral facet and uncovertebral hypertrophy. Resulting severe canal stenosis and severe right greater than left foraminal stenosis. C4-C5: Right greater than left facet and uncovertebral hypertrophy. Resulting severe right and moderate left foraminal stenosis. Patent canal. C5-C6: Posterior disc osteophyte complex. Bilateral facet uncovertebral  hypertrophy. Severe bilateral foraminal stenosis. Mild canal stenosis. C6-C7: Left greater than right facet and uncovertebral hypertrophy. Resulting moderate to severe bilateral foraminal stenosis. Patent canal. C7-T1: Bilateral uncovertebral hypertrophy. Resulting mild bilateral foraminal stenosis. Patent canal. MRI LUMBAR SPINE FINDINGS Motion limited study. Also, incomplete study without axial T1. Within this limitation: Segmentation: Standard. Alignment:  No substantial sagittal subluxation. Vertebrae: Mild edema along the anterior/superior L3 vertebral body is likely degenerative in etiology. No specific evidence of acute fracture or discitis/osteomyelitis. Conus medullaris and cauda equina: Conus extends to the L1 level. Conus appears normal. Paraspinal and other soft tissues: Unremarkable. Disc  levels: T12-L1: No significant disc protrusion, foraminal stenosis, or canal stenosis. L1-L2: No significant disc protrusion, foraminal stenosis, or canal stenosis. L2-L3: Mild disc bulge.  No significant stenosis. L3-L4: No significant disc protrusion, foraminal stenosis, or canal stenosis. L4-L5: Small central disc protrusion. No significant canal or foraminal stenosis. L5-S1: Mild broad disc bulging. Bilateral facet arthropathy. Moderate right and mild left foraminal stenosis. Patent canal. IMPRESSION: MRI CERVICAL SPINE: 1. Severe canal stenosis at C3-C4. 2. Severe foraminal stenosis on the right at C4-C5 and bilaterally at C3-C4 and C5-C6. 3. Moderate to severe foraminal stenosis on the left at C2-C3 and bilaterally at C6-C7. MRI LUMBAR SPINE: Moderate right and mild left foraminal stenosis at L5-S1. Electronically Signed   By: Feliberto Harts M.D.   On: 10/09/2023 23:25   MR CERVICAL SPINE WO CONTRAST Result Date: 10/09/2023 CLINICAL DATA:  Spinal stenosis, cervical; Lumbar radiculopathy, no red flags, no prior management. EXAM: MRI CERVICAL AND LUMBAR SPINE WITHOUT CONTRAST TECHNIQUE: Multiplanar and  multiecho pulse sequences of the cervical spine, to include the craniocervical junction and cervicothoracic junction, and lumbar spine, were obtained without intravenous contrast. COMPARISON:  None Available. FINDINGS: MRI CERVICAL SPINE FINDINGS Alignment: No substantial sagittal subluxation. Vertebrae: No fracture, evidence of discitis, or bone lesion. Cord: Normal cord signal. Posterior Fossa, vertebral arteries, paraspinal tissues: Negative. Disc levels: C2-C3: Left facet and uncovertebral hypertrophy with moderate to severe left foraminal stenosis. Patent canal and right foramen. C3-C4: Posterior disc osteophyte complex and ligamentum flavum thickening. Bilateral facet and uncovertebral hypertrophy. Resulting severe canal stenosis and severe right greater than left foraminal stenosis. C4-C5: Right greater than left facet and uncovertebral hypertrophy. Resulting severe right and moderate left foraminal stenosis. Patent canal. C5-C6: Posterior disc osteophyte complex. Bilateral facet uncovertebral hypertrophy. Severe bilateral foraminal stenosis. Mild canal stenosis. C6-C7: Left greater than right facet and uncovertebral hypertrophy. Resulting moderate to severe bilateral foraminal stenosis. Patent canal. C7-T1: Bilateral uncovertebral hypertrophy. Resulting mild bilateral foraminal stenosis. Patent canal. MRI LUMBAR SPINE FINDINGS Motion limited study. Also, incomplete study without axial T1. Within this limitation: Segmentation: Standard. Alignment:  No substantial sagittal subluxation. Vertebrae: Mild edema along the anterior/superior L3 vertebral body is likely degenerative in etiology. No specific evidence of acute fracture or discitis/osteomyelitis. Conus medullaris and cauda equina: Conus extends to the L1 level. Conus appears normal. Paraspinal and other soft tissues: Unremarkable. Disc levels: T12-L1: No significant disc protrusion, foraminal stenosis, or canal stenosis. L1-L2: No significant disc  protrusion, foraminal stenosis, or canal stenosis. L2-L3: Mild disc bulge.  No significant stenosis. L3-L4: No significant disc protrusion, foraminal stenosis, or canal stenosis. L4-L5: Small central disc protrusion. No significant canal or foraminal stenosis. L5-S1: Mild broad disc bulging. Bilateral facet arthropathy. Moderate right and mild left foraminal stenosis. Patent canal. IMPRESSION: MRI CERVICAL SPINE: 1. Severe canal stenosis at C3-C4. 2. Severe foraminal stenosis on the right at C4-C5 and bilaterally at C3-C4 and C5-C6. 3. Moderate to severe foraminal stenosis on the left at C2-C3 and bilaterally at C6-C7. MRI LUMBAR SPINE: Moderate right and mild left foraminal stenosis at L5-S1. Electronically Signed   By: Feliberto Harts M.D.   On: 10/09/2023 23:25    Procedures Procedures    Medications Ordered in ED Medications - No data to display  ED Course/ Medical Decision Making/ A&P                                 Medical Decision Making Amount and/or Complexity  of Data Reviewed Radiology: ordered and independent interpretation performed. Decision-making details documented in ED Course.   Pt with multiple medical problems and comorbidities and presenting today with a complaint that caries a high risk for morbidity and mortality.  Here today after falling out of the P TAR stretcher and hitting his head on the door frame.  Patient was just being discharged from the hospital for hypoglycemia.  Patient reports overall he is feeling fine he just has some mild pain on the left side of his head where he hit the door frame.  He is otherwise mentating normally and is having no vomiting.  Blood pressure is normal.  No other signs of significant injury from the fall as he was lowered to the floor.  Head CT pending.  9:42 PM CTs of the head without intracranial hemorrhage.  Allergy reports generalized atrophy and sinus disease but no other acute abnormalities.  Patient is stable for discharge back  to [facility].         Final Clinical Impression(s) / ED Diagnoses Final diagnoses:  Injury of head, initial encounter    Rx / DC Orders ED Discharge Orders     None         Gwyneth Sprout, MD 10/10/23 2142

## 2023-10-12 ENCOUNTER — Other Ambulatory Visit (HOSPITAL_COMMUNITY): Payer: Self-pay | Admitting: Cardiology

## 2023-10-12 DIAGNOSIS — E782 Mixed hyperlipidemia: Secondary | ICD-10-CM

## 2023-10-13 DIAGNOSIS — R531 Weakness: Secondary | ICD-10-CM | POA: Diagnosis not present

## 2023-10-13 DIAGNOSIS — I480 Paroxysmal atrial fibrillation: Secondary | ICD-10-CM | POA: Diagnosis not present

## 2023-10-13 DIAGNOSIS — I639 Cerebral infarction, unspecified: Secondary | ICD-10-CM | POA: Diagnosis not present

## 2023-10-13 DIAGNOSIS — E1140 Type 2 diabetes mellitus with diabetic neuropathy, unspecified: Secondary | ICD-10-CM | POA: Diagnosis not present

## 2023-10-13 DIAGNOSIS — I5032 Chronic diastolic (congestive) heart failure: Secondary | ICD-10-CM | POA: Diagnosis not present

## 2023-10-13 DIAGNOSIS — E1142 Type 2 diabetes mellitus with diabetic polyneuropathy: Secondary | ICD-10-CM | POA: Diagnosis not present

## 2023-10-13 DIAGNOSIS — G8929 Other chronic pain: Secondary | ICD-10-CM | POA: Diagnosis not present

## 2023-10-16 DIAGNOSIS — E1142 Type 2 diabetes mellitus with diabetic polyneuropathy: Secondary | ICD-10-CM | POA: Diagnosis not present

## 2023-10-16 DIAGNOSIS — I639 Cerebral infarction, unspecified: Secondary | ICD-10-CM | POA: Diagnosis not present

## 2023-10-16 DIAGNOSIS — I5032 Chronic diastolic (congestive) heart failure: Secondary | ICD-10-CM | POA: Diagnosis not present

## 2023-10-16 DIAGNOSIS — E1140 Type 2 diabetes mellitus with diabetic neuropathy, unspecified: Secondary | ICD-10-CM | POA: Diagnosis not present

## 2023-10-16 DIAGNOSIS — J449 Chronic obstructive pulmonary disease, unspecified: Secondary | ICD-10-CM | POA: Diagnosis not present

## 2023-10-16 DIAGNOSIS — I480 Paroxysmal atrial fibrillation: Secondary | ICD-10-CM | POA: Diagnosis not present

## 2023-10-17 DIAGNOSIS — I5033 Acute on chronic diastolic (congestive) heart failure: Secondary | ICD-10-CM | POA: Diagnosis not present

## 2023-10-17 DIAGNOSIS — Z8673 Personal history of transient ischemic attack (TIA), and cerebral infarction without residual deficits: Secondary | ICD-10-CM | POA: Diagnosis not present

## 2023-10-17 DIAGNOSIS — J449 Chronic obstructive pulmonary disease, unspecified: Secondary | ICD-10-CM | POA: Diagnosis not present

## 2023-10-17 DIAGNOSIS — E119 Type 2 diabetes mellitus without complications: Secondary | ICD-10-CM | POA: Diagnosis not present

## 2023-10-17 DIAGNOSIS — G8194 Hemiplegia, unspecified affecting left nondominant side: Secondary | ICD-10-CM | POA: Diagnosis not present

## 2023-10-17 DIAGNOSIS — I4891 Unspecified atrial fibrillation: Secondary | ICD-10-CM | POA: Diagnosis not present

## 2023-10-20 ENCOUNTER — Encounter (HOSPITAL_COMMUNITY): Payer: Self-pay | Admitting: Internal Medicine

## 2023-10-20 ENCOUNTER — Inpatient Hospital Stay (HOSPITAL_COMMUNITY)
Admission: EM | Admit: 2023-10-20 | Discharge: 2023-10-25 | DRG: 871 | Disposition: A | Discharge status: Skilled Nursing Facility | Source: Skilled Nursing Facility | Attending: Internal Medicine | Admitting: Internal Medicine

## 2023-10-20 ENCOUNTER — Emergency Department (HOSPITAL_COMMUNITY)

## 2023-10-20 ENCOUNTER — Other Ambulatory Visit: Payer: Self-pay

## 2023-10-20 DIAGNOSIS — F32A Depression, unspecified: Secondary | ICD-10-CM | POA: Diagnosis present

## 2023-10-20 DIAGNOSIS — J101 Influenza due to other identified influenza virus with other respiratory manifestations: Secondary | ICD-10-CM | POA: Diagnosis not present

## 2023-10-20 DIAGNOSIS — A4189 Other specified sepsis: Secondary | ICD-10-CM | POA: Diagnosis present

## 2023-10-20 DIAGNOSIS — N179 Acute kidney failure, unspecified: Secondary | ICD-10-CM | POA: Diagnosis present

## 2023-10-20 DIAGNOSIS — Z1152 Encounter for screening for COVID-19: Secondary | ICD-10-CM | POA: Diagnosis not present

## 2023-10-20 DIAGNOSIS — R918 Other nonspecific abnormal finding of lung field: Secondary | ICD-10-CM | POA: Diagnosis not present

## 2023-10-20 DIAGNOSIS — N1831 Chronic kidney disease, stage 3a: Secondary | ICD-10-CM | POA: Diagnosis present

## 2023-10-20 DIAGNOSIS — I480 Paroxysmal atrial fibrillation: Secondary | ICD-10-CM | POA: Diagnosis present

## 2023-10-20 DIAGNOSIS — Y92239 Unspecified place in hospital as the place of occurrence of the external cause: Secondary | ICD-10-CM | POA: Diagnosis not present

## 2023-10-20 DIAGNOSIS — Z953 Presence of xenogenic heart valve: Secondary | ICD-10-CM

## 2023-10-20 DIAGNOSIS — R6520 Severe sepsis without septic shock: Secondary | ICD-10-CM | POA: Diagnosis present

## 2023-10-20 DIAGNOSIS — I5032 Chronic diastolic (congestive) heart failure: Secondary | ICD-10-CM | POA: Diagnosis present

## 2023-10-20 DIAGNOSIS — J440 Chronic obstructive pulmonary disease with acute lower respiratory infection: Secondary | ICD-10-CM | POA: Diagnosis present

## 2023-10-20 DIAGNOSIS — Z7985 Long-term (current) use of injectable non-insulin antidiabetic drugs: Secondary | ICD-10-CM

## 2023-10-20 DIAGNOSIS — J441 Chronic obstructive pulmonary disease with (acute) exacerbation: Secondary | ICD-10-CM | POA: Diagnosis present

## 2023-10-20 DIAGNOSIS — E872 Acidosis, unspecified: Secondary | ICD-10-CM | POA: Diagnosis present

## 2023-10-20 DIAGNOSIS — J9601 Acute respiratory failure with hypoxia: Secondary | ICD-10-CM | POA: Diagnosis not present

## 2023-10-20 DIAGNOSIS — Z7901 Long term (current) use of anticoagulants: Secondary | ICD-10-CM

## 2023-10-20 DIAGNOSIS — E1165 Type 2 diabetes mellitus with hyperglycemia: Secondary | ICD-10-CM | POA: Diagnosis not present

## 2023-10-20 DIAGNOSIS — Z6841 Body Mass Index (BMI) 40.0 and over, adult: Secondary | ICD-10-CM | POA: Diagnosis not present

## 2023-10-20 DIAGNOSIS — T380X5A Adverse effect of glucocorticoids and synthetic analogues, initial encounter: Secondary | ICD-10-CM | POA: Diagnosis not present

## 2023-10-20 DIAGNOSIS — J449 Chronic obstructive pulmonary disease, unspecified: Secondary | ICD-10-CM | POA: Diagnosis not present

## 2023-10-20 DIAGNOSIS — Z8673 Personal history of transient ischemic attack (TIA), and cerebral infarction without residual deficits: Secondary | ICD-10-CM

## 2023-10-20 DIAGNOSIS — I452 Bifascicular block: Secondary | ICD-10-CM | POA: Diagnosis present

## 2023-10-20 DIAGNOSIS — M6281 Muscle weakness (generalized): Secondary | ICD-10-CM | POA: Diagnosis not present

## 2023-10-20 DIAGNOSIS — Z5982 Transportation insecurity: Secondary | ICD-10-CM

## 2023-10-20 DIAGNOSIS — N400 Enlarged prostate without lower urinary tract symptoms: Secondary | ICD-10-CM | POA: Diagnosis present

## 2023-10-20 DIAGNOSIS — E1122 Type 2 diabetes mellitus with diabetic chronic kidney disease: Secondary | ICD-10-CM | POA: Diagnosis present

## 2023-10-20 DIAGNOSIS — J1000 Influenza due to other identified influenza virus with unspecified type of pneumonia: Secondary | ICD-10-CM | POA: Diagnosis present

## 2023-10-20 DIAGNOSIS — Z8249 Family history of ischemic heart disease and other diseases of the circulatory system: Secondary | ICD-10-CM

## 2023-10-20 DIAGNOSIS — F419 Anxiety disorder, unspecified: Secondary | ICD-10-CM | POA: Diagnosis present

## 2023-10-20 DIAGNOSIS — I5033 Acute on chronic diastolic (congestive) heart failure: Secondary | ICD-10-CM | POA: Diagnosis not present

## 2023-10-20 DIAGNOSIS — I509 Heart failure, unspecified: Secondary | ICD-10-CM | POA: Diagnosis not present

## 2023-10-20 DIAGNOSIS — E876 Hypokalemia: Secondary | ICD-10-CM | POA: Diagnosis present

## 2023-10-20 DIAGNOSIS — Z881 Allergy status to other antibiotic agents status: Secondary | ICD-10-CM

## 2023-10-20 DIAGNOSIS — Z794 Long term (current) use of insulin: Secondary | ICD-10-CM

## 2023-10-20 DIAGNOSIS — I4891 Unspecified atrial fibrillation: Secondary | ICD-10-CM | POA: Diagnosis not present

## 2023-10-20 DIAGNOSIS — I639 Cerebral infarction, unspecified: Secondary | ICD-10-CM | POA: Diagnosis present

## 2023-10-20 DIAGNOSIS — E7800 Pure hypercholesterolemia, unspecified: Secondary | ICD-10-CM | POA: Diagnosis present

## 2023-10-20 DIAGNOSIS — Z79899 Other long term (current) drug therapy: Secondary | ICD-10-CM

## 2023-10-20 DIAGNOSIS — Z952 Presence of prosthetic heart valve: Secondary | ICD-10-CM

## 2023-10-20 DIAGNOSIS — I10 Essential (primary) hypertension: Secondary | ICD-10-CM | POA: Diagnosis present

## 2023-10-20 DIAGNOSIS — I130 Hypertensive heart and chronic kidney disease with heart failure and stage 1 through stage 4 chronic kidney disease, or unspecified chronic kidney disease: Secondary | ICD-10-CM | POA: Diagnosis present

## 2023-10-20 DIAGNOSIS — Z66 Do not resuscitate: Secondary | ICD-10-CM | POA: Diagnosis present

## 2023-10-20 DIAGNOSIS — I878 Other specified disorders of veins: Secondary | ICD-10-CM | POA: Diagnosis present

## 2023-10-20 DIAGNOSIS — Z860101 Personal history of adenomatous and serrated colon polyps: Secondary | ICD-10-CM

## 2023-10-20 DIAGNOSIS — Z96652 Presence of left artificial knee joint: Secondary | ICD-10-CM | POA: Diagnosis present

## 2023-10-20 DIAGNOSIS — E119 Type 2 diabetes mellitus without complications: Secondary | ICD-10-CM | POA: Diagnosis not present

## 2023-10-20 DIAGNOSIS — J189 Pneumonia, unspecified organism: Secondary | ICD-10-CM | POA: Diagnosis not present

## 2023-10-20 DIAGNOSIS — Z833 Family history of diabetes mellitus: Secondary | ICD-10-CM

## 2023-10-20 DIAGNOSIS — M19012 Primary osteoarthritis, left shoulder: Secondary | ICD-10-CM | POA: Diagnosis present

## 2023-10-20 DIAGNOSIS — R0602 Shortness of breath: Secondary | ICD-10-CM | POA: Diagnosis not present

## 2023-10-20 DIAGNOSIS — E1142 Type 2 diabetes mellitus with diabetic polyneuropathy: Secondary | ICD-10-CM | POA: Diagnosis present

## 2023-10-20 DIAGNOSIS — Z888 Allergy status to other drugs, medicaments and biological substances status: Secondary | ICD-10-CM

## 2023-10-20 DIAGNOSIS — E66813 Obesity, class 3: Secondary | ICD-10-CM | POA: Diagnosis not present

## 2023-10-20 DIAGNOSIS — G8194 Hemiplegia, unspecified affecting left nondominant side: Secondary | ICD-10-CM | POA: Diagnosis not present

## 2023-10-20 DIAGNOSIS — I69828 Other speech and language deficits following other cerebrovascular disease: Secondary | ICD-10-CM | POA: Diagnosis not present

## 2023-10-20 DIAGNOSIS — G40909 Epilepsy, unspecified, not intractable, without status epilepticus: Secondary | ICD-10-CM | POA: Diagnosis present

## 2023-10-20 DIAGNOSIS — I482 Chronic atrial fibrillation, unspecified: Secondary | ICD-10-CM | POA: Diagnosis present

## 2023-10-20 DIAGNOSIS — Z5986 Financial insecurity: Secondary | ICD-10-CM

## 2023-10-20 DIAGNOSIS — Z604 Social exclusion and rejection: Secondary | ICD-10-CM | POA: Diagnosis present

## 2023-10-20 DIAGNOSIS — I493 Ventricular premature depolarization: Secondary | ICD-10-CM | POA: Diagnosis present

## 2023-10-20 DIAGNOSIS — Z87891 Personal history of nicotine dependence: Secondary | ICD-10-CM

## 2023-10-20 DIAGNOSIS — G4733 Obstructive sleep apnea (adult) (pediatric): Secondary | ICD-10-CM | POA: Diagnosis present

## 2023-10-20 DIAGNOSIS — J09X1 Influenza due to identified novel influenza A virus with pneumonia: Secondary | ICD-10-CM | POA: Diagnosis present

## 2023-10-20 DIAGNOSIS — I2510 Atherosclerotic heart disease of native coronary artery without angina pectoris: Secondary | ICD-10-CM | POA: Diagnosis present

## 2023-10-20 LAB — CBC
HCT: 42.1 % (ref 39.0–52.0)
Hemoglobin: 14.1 g/dL (ref 13.0–17.0)
MCH: 30.3 pg (ref 26.0–34.0)
MCHC: 33.5 g/dL (ref 30.0–36.0)
MCV: 90.3 fL (ref 80.0–100.0)
Platelets: 178 10*3/uL (ref 150–400)
RBC: 4.66 MIL/uL (ref 4.22–5.81)
RDW: 15.2 % (ref 11.5–15.5)
WBC: 9.2 10*3/uL (ref 4.0–10.5)
nRBC: 0 % (ref 0.0–0.2)

## 2023-10-20 LAB — RESP PANEL BY RT-PCR (RSV, FLU A&B, COVID)  RVPGX2
Influenza A by PCR: POSITIVE — AB
Influenza B by PCR: NEGATIVE
Resp Syncytial Virus by PCR: NEGATIVE
SARS Coronavirus 2 by RT PCR: NEGATIVE

## 2023-10-20 LAB — I-STAT CG4 LACTIC ACID, ED
Lactic Acid, Venous: 2 mmol/L (ref 0.5–1.9)
Lactic Acid, Venous: 1.2 mmol/L (ref 0.5–1.9)

## 2023-10-20 LAB — COMPREHENSIVE METABOLIC PANEL
ALT: 28 U/L (ref 0–44)
AST: 26 U/L (ref 15–41)
Albumin: 3.6 g/dL (ref 3.5–5.0)
Alkaline Phosphatase: 46 U/L (ref 38–126)
Anion gap: 10 (ref 5–15)
BUN: 13 mg/dL (ref 8–23)
CO2: 27 mmol/L (ref 22–32)
Calcium: 8.7 mg/dL — ABNORMAL LOW (ref 8.9–10.3)
Chloride: 97 mmol/L — ABNORMAL LOW (ref 98–111)
Creatinine, Ser: 1.34 mg/dL — ABNORMAL HIGH (ref 0.61–1.24)
GFR, Estimated: 58 mL/min — ABNORMAL LOW (ref >60)
Glucose, Bld: 126 mg/dL — ABNORMAL HIGH (ref 70–99)
Potassium: 3.2 mmol/L — ABNORMAL LOW (ref 3.5–5.1)
Sodium: 134 mmol/L — ABNORMAL LOW (ref 135–145)
Total Bilirubin: 0.7 mg/dL (ref 0.0–1.2)
Total Protein: 6.5 g/dL (ref 6.5–8.1)

## 2023-10-20 LAB — GLUCOSE, CAPILLARY
Glucose-Capillary: 84 mg/dL (ref 70–99)
Glucose-Capillary: 112 mg/dL — ABNORMAL HIGH (ref 70–99)

## 2023-10-20 LAB — CBG MONITORING, ED: Glucose-Capillary: 106 mg/dL — ABNORMAL HIGH (ref 70–99)

## 2023-10-20 LAB — TSH: TSH: 1.29 u[IU]/mL (ref 0.350–4.500)

## 2023-10-20 LAB — BRAIN NATRIURETIC PEPTIDE: B Natriuretic Peptide: 235.5 pg/mL — ABNORMAL HIGH (ref 0.0–100.0)

## 2023-10-20 LAB — STREP PNEUMONIAE URINARY ANTIGEN: Strep Pneumo Urinary Antigen: NEGATIVE

## 2023-10-20 LAB — PROCALCITONIN: Procalcitonin: 0.14 ng/mL

## 2023-10-20 MED ORDER — PANTOPRAZOLE SODIUM 40 MG PO TBEC
40.0000 mg | DELAYED_RELEASE_TABLET | Freq: Every day | ORAL | Status: DC
  Administered 2023-10-20 – 2023-10-25 (×6): 40 mg via ORAL
  Filled 2023-10-20 (×6): qty 1

## 2023-10-20 MED ORDER — ACETAMINOPHEN 650 MG RE SUPP
650.0000 mg | Freq: Once | RECTAL | Status: AC
  Administered 2023-10-20: 650 mg via RECTAL
  Filled 2023-10-20: qty 1

## 2023-10-20 MED ORDER — METOPROLOL SUCCINATE ER 25 MG PO TB24
25.0000 mg | ORAL_TABLET | Freq: Every day | ORAL | Status: DC
  Administered 2023-10-20 – 2023-10-25 (×6): 25 mg via ORAL
  Filled 2023-10-20 (×6): qty 1

## 2023-10-20 MED ORDER — SENNA-DOCUSATE SODIUM 8.6-50 MG PO TABS: 2.0000 | ORAL_TABLET | Freq: Two times a day (BID) | ORAL | Status: DC

## 2023-10-20 MED ORDER — SODIUM CHLORIDE 0.9% FLUSH: 3.0000 mL | INTRAVENOUS | Status: DC | PRN

## 2023-10-20 MED ORDER — SODIUM CHLORIDE 0.9% FLUSH
3.0000 mL | Freq: Two times a day (BID) | INTRAVENOUS | Status: DC
  Administered 2023-10-20 – 2023-10-25 (×11): 3 mL via INTRAVENOUS

## 2023-10-20 MED ORDER — FLUTICASONE FUROATE-VILANTEROL 200-25 MCG/ACT IN AEPB
1.0000 | INHALATION_SPRAY | Freq: Every day | RESPIRATORY_TRACT | Status: DC
  Filled 2023-10-20: qty 28

## 2023-10-20 MED ORDER — SODIUM CHLORIDE 0.9 % IV SOLN
500.0000 mg | INTRAVENOUS | Status: DC
  Filled 2023-10-20: qty 5

## 2023-10-20 MED ORDER — SODIUM CHLORIDE 0.9 % IV BOLUS (SEPSIS)
1000.0000 mL | Freq: Once | INTRAVENOUS | Status: AC
  Administered 2023-10-20: 1000 mL via INTRAVENOUS

## 2023-10-20 MED ORDER — SODIUM CHLORIDE 0.9 % IV SOLN: 250.0000 mL | INTRAVENOUS | Status: AC | PRN

## 2023-10-20 MED ORDER — ACETAMINOPHEN 650 MG RE SUPP: 650.0000 mg | Freq: Four times a day (QID) | RECTAL | Status: DC | PRN

## 2023-10-20 MED ORDER — FENOFIBRATE 160 MG PO TABS
160.0000 mg | ORAL_TABLET | Freq: Every day | ORAL | Status: DC
  Administered 2023-10-20 – 2023-10-25 (×6): 160 mg via ORAL
  Filled 2023-10-20 (×6): qty 1

## 2023-10-20 MED ORDER — OSELTAMIVIR PHOSPHATE 75 MG PO CAPS
75.0000 mg | ORAL_CAPSULE | Freq: Two times a day (BID) | ORAL | Status: AC
  Administered 2023-10-20 – 2023-10-24 (×10): 75 mg via ORAL
  Filled 2023-10-20 (×10): qty 1

## 2023-10-20 MED ORDER — COLCHICINE 0.6 MG PO TABS
0.6000 mg | ORAL_TABLET | Freq: Every day | ORAL | Status: DC
Start: 2023-10-20 — End: 2023-10-25
  Administered 2023-10-20 – 2023-10-25 (×6): 0.6 mg via ORAL
  Filled 2023-10-20 (×6): qty 1

## 2023-10-20 MED ORDER — VENLAFAXINE HCL ER 75 MG PO CP24
150.0000 mg | ORAL_CAPSULE | Freq: Every day | ORAL | Status: DC
  Administered 2023-10-21 – 2023-10-25 (×5): 150 mg via ORAL
  Filled 2023-10-20 (×5): qty 2

## 2023-10-20 MED ORDER — ATORVASTATIN CALCIUM 40 MG PO TABS
40.0000 mg | ORAL_TABLET | Freq: Every day | ORAL | Status: DC
  Administered 2023-10-20 – 2023-10-24 (×5): 40 mg via ORAL
  Filled 2023-10-20 (×5): qty 1

## 2023-10-20 MED ORDER — SODIUM CHLORIDE 0.9 % IV SOLN
500.0000 mg | Freq: Once | INTRAVENOUS | Status: AC
  Administered 2023-10-20: 500 mg via INTRAVENOUS
  Filled 2023-10-20: qty 5

## 2023-10-20 MED ORDER — POLYETHYLENE GLYCOL 3350 17 GM/SCOOP PO POWD: 17.0000 g | Freq: Every day | ORAL | Status: DC

## 2023-10-20 MED ORDER — ALLOPURINOL 100 MG PO TABS
100.0000 mg | ORAL_TABLET | Freq: Every day | ORAL | Status: DC
  Administered 2023-10-20 – 2023-10-25 (×6): 100 mg via ORAL
  Filled 2023-10-20 (×6): qty 1

## 2023-10-20 MED ORDER — INSULIN ASPART 100 UNIT/ML IJ SOLN
0.0000 [IU] | Freq: Every day | INTRAMUSCULAR | Status: DC
  Administered 2023-10-22: 3 [IU] via SUBCUTANEOUS
  Administered 2023-10-23 – 2023-10-24 (×2): 2 [IU] via SUBCUTANEOUS

## 2023-10-20 MED ORDER — INSULIN ASPART 100 UNIT/ML IJ SOLN
0.0000 [IU] | Freq: Three times a day (TID) | INTRAMUSCULAR | Status: DC
  Administered 2023-10-21: 3 [IU] via SUBCUTANEOUS
  Administered 2023-10-21 – 2023-10-22 (×2): 2 [IU] via SUBCUTANEOUS

## 2023-10-20 MED ORDER — LEVETIRACETAM 500 MG PO TABS
500.0000 mg | ORAL_TABLET | Freq: Two times a day (BID) | ORAL | Status: DC
  Administered 2023-10-20 – 2023-10-25 (×11): 500 mg via ORAL
  Filled 2023-10-20 (×11): qty 1

## 2023-10-20 MED ORDER — REPAGLINIDE 1 MG PO TABS
2.0000 mg | ORAL_TABLET | Freq: Two times a day (BID) | ORAL | Status: DC
Start: 2023-10-20 — End: 2023-10-25
  Administered 2023-10-20 – 2023-10-25 (×11): 2 mg via ORAL
  Filled 2023-10-20 (×2): qty 2
  Filled 2023-10-20: qty 1
  Filled 2023-10-20 (×10): qty 2

## 2023-10-20 MED ORDER — SODIUM CHLORIDE 0.9 % IV SOLN: 2.0000 g | INTRAVENOUS | Status: DC

## 2023-10-20 MED ORDER — ICOSAPENT ETHYL 1 G PO CAPS
2.0000 g | ORAL_CAPSULE | Freq: Two times a day (BID) | ORAL | Status: DC
  Administered 2023-10-20 – 2023-10-25 (×11): 2 g via ORAL
  Filled 2023-10-20 (×12): qty 2

## 2023-10-20 MED ORDER — POTASSIUM CHLORIDE CRYS ER 20 MEQ PO TBCR
40.0000 meq | EXTENDED_RELEASE_TABLET | Freq: Once | ORAL | Status: AC
  Administered 2023-10-20: 40 meq via ORAL
  Filled 2023-10-20: qty 2

## 2023-10-20 MED ORDER — LACOSAMIDE 50 MG PO TABS
150.0000 mg | ORAL_TABLET | Freq: Two times a day (BID) | ORAL | Status: DC
  Administered 2023-10-20 – 2023-10-25 (×11): 150 mg via ORAL
  Filled 2023-10-20 (×11): qty 3

## 2023-10-20 MED ORDER — LACOSAMIDE 150 MG PO TABS: 150.0000 mg | ORAL_TABLET | Freq: Two times a day (BID) | ORAL | Status: DC

## 2023-10-20 MED ORDER — PREGABALIN 25 MG PO CAPS
150.0000 mg | ORAL_CAPSULE | Freq: Three times a day (TID) | ORAL | Status: DC
Start: 2023-10-20 — End: 2023-10-25
  Administered 2023-10-20 – 2023-10-25 (×16): 150 mg via ORAL
  Filled 2023-10-20 (×16): qty 2

## 2023-10-20 MED ORDER — GUAIFENESIN-DM 100-10 MG/5ML PO SYRP: 5.0000 mL | ORAL_SOLUTION | ORAL | Status: DC | PRN

## 2023-10-20 MED ORDER — ARIPIPRAZOLE 5 MG PO TABS
5.0000 mg | ORAL_TABLET | Freq: Every day | ORAL | Status: DC
  Administered 2023-10-20 – 2023-10-24 (×5): 5 mg via ORAL
  Filled 2023-10-20 (×5): qty 1

## 2023-10-20 MED ORDER — APIXABAN 5 MG PO TABS
5.0000 mg | ORAL_TABLET | Freq: Two times a day (BID) | ORAL | Status: DC
  Administered 2023-10-20 – 2023-10-25 (×11): 5 mg via ORAL
  Filled 2023-10-20 (×11): qty 1

## 2023-10-20 MED ORDER — IPRATROPIUM-ALBUTEROL 0.5-2.5 (3) MG/3ML IN SOLN
3.0000 mL | Freq: Four times a day (QID) | RESPIRATORY_TRACT | Status: DC
  Administered 2023-10-20 – 2023-10-21 (×4): 3 mL via RESPIRATORY_TRACT
  Filled 2023-10-20 (×4): qty 3

## 2023-10-20 MED ORDER — SODIUM CHLORIDE 0.9 % IV SOLN
2.0000 g | INTRAVENOUS | Status: AC
  Administered 2023-10-21 – 2023-10-24 (×4): 2 g via INTRAVENOUS
  Filled 2023-10-20 (×4): qty 20

## 2023-10-20 MED ORDER — VITAMIN B-12 1000 MCG PO TABS
1000.0000 ug | ORAL_TABLET | Freq: Every day | ORAL | Status: DC
  Administered 2023-10-20 – 2023-10-25 (×6): 1000 ug via ORAL
  Filled 2023-10-20 (×6): qty 1

## 2023-10-20 MED ORDER — SODIUM CHLORIDE 0.9 % IV SOLN
1.0000 g | Freq: Once | INTRAVENOUS | Status: AC
  Administered 2023-10-20: 1 g via INTRAVENOUS
  Filled 2023-10-20: qty 10

## 2023-10-20 MED ORDER — METHOCARBAMOL 500 MG PO TABS
500.0000 mg | ORAL_TABLET | Freq: Three times a day (TID) | ORAL | Status: DC | PRN
  Administered 2023-10-20 – 2023-10-24 (×5): 500 mg via ORAL
  Filled 2023-10-20 (×5): qty 1

## 2023-10-20 MED ORDER — UMECLIDINIUM BROMIDE 62.5 MCG/ACT IN AEPB
1.0000 | INHALATION_SPRAY | Freq: Every day | RESPIRATORY_TRACT | Status: DC
  Filled 2023-10-20: qty 7

## 2023-10-20 MED ORDER — SENNOSIDES-DOCUSATE SODIUM 8.6-50 MG PO TABS
2.0000 | ORAL_TABLET | Freq: Two times a day (BID) | ORAL | Status: DC
  Administered 2023-10-21: 2 via ORAL
  Filled 2023-10-20 (×9): qty 2

## 2023-10-20 MED ORDER — TRAZODONE HCL 100 MG PO TABS
200.0000 mg | ORAL_TABLET | Freq: Every day | ORAL | Status: DC
  Administered 2023-10-20 – 2023-10-24 (×5): 200 mg via ORAL
  Filled 2023-10-20 (×5): qty 2

## 2023-10-20 MED ORDER — BENZONATATE 100 MG PO CAPS
200.0000 mg | ORAL_CAPSULE | Freq: Three times a day (TID) | ORAL | Status: DC
  Administered 2023-10-20 – 2023-10-25 (×16): 200 mg via ORAL
  Filled 2023-10-20 (×16): qty 2

## 2023-10-20 MED ORDER — AZITHROMYCIN 500 MG IV SOLR
500.0000 mg | INTRAVENOUS | Status: DC
  Administered 2023-10-21 – 2023-10-22 (×2): 500 mg via INTRAVENOUS
  Filled 2023-10-20 (×2): qty 5

## 2023-10-20 MED ORDER — BISACODYL 5 MG PO TBEC: 10.0000 mg | DELAYED_RELEASE_TABLET | Freq: Every day | ORAL | Status: DC | PRN

## 2023-10-20 MED ORDER — POLYETHYLENE GLYCOL 3350 17 G PO PACK
17.0000 g | PACK | Freq: Every day | ORAL | Status: DC
  Administered 2023-10-21: 17 g via ORAL
  Filled 2023-10-20 (×6): qty 1

## 2023-10-20 MED ORDER — ROPINIROLE HCL 0.25 MG PO TABS
0.2500 mg | ORAL_TABLET | Freq: Two times a day (BID) | ORAL | Status: DC
  Administered 2023-10-20 – 2023-10-25 (×11): 0.25 mg via ORAL
  Filled 2023-10-20 (×12): qty 1

## 2023-10-20 MED ORDER — MIRTAZAPINE 15 MG PO TABS
15.0000 mg | ORAL_TABLET | Freq: Every day | ORAL | Status: DC
  Administered 2023-10-20 – 2023-10-24 (×5): 15 mg via ORAL
  Filled 2023-10-20 (×5): qty 1

## 2023-10-20 MED ORDER — BUSPIRONE HCL 10 MG PO TABS
10.0000 mg | ORAL_TABLET | Freq: Three times a day (TID) | ORAL | Status: DC
  Administered 2023-10-20 – 2023-10-25 (×16): 10 mg via ORAL
  Filled 2023-10-20 (×16): qty 1

## 2023-10-20 MED ORDER — FLUTICASONE PROPIONATE 50 MCG/ACT NA SUSP
2.0000 | Freq: Every day | NASAL | Status: DC
  Administered 2023-10-21 – 2023-10-25 (×5): 2 via NASAL
  Filled 2023-10-20: qty 16

## 2023-10-20 MED ORDER — FERROUS SULFATE 325 (65 FE) MG PO TABS
325.0000 mg | ORAL_TABLET | Freq: Every day | ORAL | Status: DC
Start: 2023-10-20 — End: 2023-10-25
  Administered 2023-10-20 – 2023-10-25 (×6): 325 mg via ORAL
  Filled 2023-10-20 (×6): qty 1

## 2023-10-20 MED ORDER — ACETAMINOPHEN 325 MG PO TABS
650.0000 mg | ORAL_TABLET | Freq: Four times a day (QID) | ORAL | Status: DC | PRN
  Administered 2023-10-20 – 2023-10-23 (×4): 650 mg via ORAL
  Filled 2023-10-20 (×4): qty 2

## 2023-10-20 NOTE — ED Triage Notes (Signed)
 BIB EMS from [place] [facility] for worsening SOB x2 days.  SPO2 88% on EMS initial encounter, Presents to the ED on 4L via Nasal canula. SPO2 97%. Hx of COPD, A-Fib, CHF and T2DM.

## 2023-10-20 NOTE — H&P (Addendum)
 History and Physical  Patient: Lawrence [name] NWG:[id number] DOB: 11/22/1955 DOA: 10/20/2023 DOS: the patient was seen and examined on 10/20/2023 Patient coming from: [facility]   Chief Complaint:  Chief Complaint  Patient presents with   Shortness of Breath   HPI: Lawrence [name] is a 68 y.o. male with PMH of COPD, severe aortic stenosis status post TAVR in July 2024, seizure disorder, chronic diastolic CHF (follows Dr. Shirlee Latch), atrial fibrillation on Eliquis, gout, anxiety, BPH, chronic venous stasis, morbid obesity presented from SNF with worsening shortness of breath in the last 2 days.  Patient is somewhat poor historian, reports that he was having subjective fevers and chills, shortness of breath, sleeps on the hospital bed sitting upright, felt congested.  He was sent to the ED and was noted to be hypoxic with O2 sats 88% by the EMS.  Per patient, at baseline he does not use any O2. No nausea vomiting, chest pain or any diarrhea.    ED course: In ED, patient was noted to have temp of 102.2 F, RR 24, heart rate 98-103, BP 153/71, O2 sats 96% on 5 L O2 via [place] Labs showed sodium 134, potassium 3.2, BUN 13, creatinine 1.3.  Baseline creatinine 1.1 on 10/03/2023 Lactic acid 2.0, BNP 235 Respiratory virus panel showed influenza A positive.  RSV, COVID-negative. Chest x-ray showed new bandlike opacity in the right lower lung may reflect atelectasis and/or airspace disease.   Review of Systems: As mentioned in the history of present illness. All other systems reviewed and are negative. Past Medical History:  Diagnosis Date   Acquired dilation of ascending aorta and aortic root (HCC)    40mm by echo 01/2021   Adenomatous colon polyp 2007   Anemia    Anxiety    Asthma    BPH without obstruction/lower urinary tract symptoms 02/22/2017   Chronic diastolic (congestive) heart failure (HCC)    Chronic venous stasis 03/07/2019   COPD (chronic obstructive pulmonary disease) (HCC)     Coronary artery calcification seen on CAT scan    Depression    Diabetic neuropathy (HCC) 09/11/2019   History of colon polyps 08/24/2018   Hypertension    Morbid obesity (HCC)    OSA (obstructive sleep apnea)    Pain due to onychomycosis of toenails of both feet 09/11/2019   Peripheral neuropathy 02/22/2017   Primary osteoarthritis, left shoulder 03/05/2017   PTSD (post-traumatic stress disorder)    Pure hypercholesterolemia    QT prolongation 03/07/2019   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 Lynnette Caffey and Dr. Laneta Simmers   Seizures [facility])    Severe aortic stenosis    Sinus tachycardia 03/07/2019   Sleep apnea    CPAP   Type 2 diabetes mellitus with vascular disease (HCC) 09/11/2019   Past Surgical History:  Procedure Laterality Date   ENDOVENOUS ABLATION SAPHENOUS VEIN W/ LASER Right 08/20/2020   endovenous laser ablation right greater saphenous vein by Cari Caraway MD    ENDOVENOUS ABLATION SAPHENOUS VEIN W/ LASER Left 11/16/2022   endovenous laser ablation left greater saphenous vein by Cari Caraway MD   INTRAOPERATIVE TRANSTHORACIC ECHOCARDIOGRAM N/A 02/07/2023   Procedure: INTRAOPERATIVE TRANSTHORACIC ECHOCARDIOGRAM;  Surgeon: Orbie Pyo, MD;  Location: [facility] INVASIVE CV LAB;  Service: Open Heart Surgery;  Laterality: N/A;   JOINT REPLACEMENT     left knee replacement x 2   KNEE ARTHROSCOPY Bilateral    LEFT HEART CATH AND  CORONARY ANGIOGRAPHY N/A 01/17/2018   Procedure: LEFT HEART CATH AND CORONARY ANGIOGRAPHY;  Surgeon: Marykay Lex, MD;  Location[facility] INVASIVE CV LAB;  Service: Cardiovascular;  Laterality: N/A;   PRESSURE SENSOR/CARDIOMEMS N/A 08/26/2021   Procedure: PRESSURE SENSOR/CARDIOMEMS;  Surgeon: Laurey Morale, MD;  Location: [facility] INVASIVE CV LAB;  Service: Cardiovascular;  Laterality: N/A;   RIGHT HEART CATH N/A 08/26/2021   Procedure: RIGHT HEART CATH;  Surgeon: Laurey Morale, MD;  Location:  [facility] INVASIVE CV LAB;  Service: Cardiovascular;  Laterality: N/A;   RIGHT HEART CATH AND CORONARY ANGIOGRAPHY N/A 01/26/2023   Procedure: RIGHT HEART CATH AND CORONARY ANGIOGRAPHY;  Surgeon: Orbie Pyo, MD;  Location: [facility] INVASIVE CV LAB;  Service: Cardiovascular;  Laterality: N/A;   TEE WITHOUT CARDIOVERSION N/A 08/26/2021   Procedure: TRANSESOPHAGEAL ECHOCARDIOGRAM (TEE);  Surgeon: Laurey Morale, MD;  Location: [facility] ENDOSCOPY;  Service: Cardiovascular;  Laterality: N/A;   TOOTH EXTRACTION N/A 02/03/2023   Procedure: DENTAL RESTORATION/EXTRACTIONS;  Surgeon: [name], DMD;  Location: [facility] OR;  Service: Oral Surgery;  Laterality: N/A;   TRANSCATHETER AORTIC VALVE REPLACEMENT, TRANSFEMORAL Right 02/07/2023   Procedure: Transcatheter Aortic Valve Replacement, Transfemoral;  Surgeon: Orbie Pyo, MD;  Location: [facility] INVASIVE CV LAB;  Service: Open Heart Surgery;  Laterality: Right;   UMBILICAL HERNIA REPAIR     Social History:  reports that he quit smoking about 7 years ago. His smoking use included cigarettes. He started smoking about 51 years ago. He has a 44 pack-year smoking history. He has been exposed to tobacco smoke. He has never used smokeless tobacco. He reports current alcohol use. He reports that he does not use drugs. Allergies  Allergen Reactions   Niacin And Related Other (See Comments)    Redness, flushing   Ppd [Tuberculin Purified Protein Derivative]     Unknown rxn (MAR)   Tubersol [Tuberculin, Ppd] Other (See Comments)    Unknown rxn (MAR)   Firvanq [Vancomycin] Other (See Comments)    Red man syndrome   Vibramycin [Doxycycline] Rash   Family History  Problem Relation Age of Onset   CAD Maternal Grandfather    Diabetes Other    Diabetes Mellitus II Neg Hx    Colon cancer Neg Hx    Esophageal cancer Neg Hx    Inflammatory bowel disease Neg Hx    Liver disease Neg Hx    Pancreatic cancer Neg Hx    Rectal cancer Neg Hx    Stomach cancer Neg Hx    Sleep apnea  Neg Hx    Prior to Admission medications   Medication Sig Start Date End Date Taking? Authorizing Provider  acetaminophen (TYLENOL) 650 MG CR tablet Take 650 mg by mouth every 8 (eight) hours as needed for pain.   Yes [provider]  albuterol (VENTOLIN HFA) 108 (90 Base) MCG/ACT inhaler Inhale 2 puffs into the lungs every 6 (six) hours as needed for wheezing or shortness of breath. 04/17/23  Yes Hunsucker, Lesia Sago, MD  allopurinol (ZYLOPRIM) 100 MG tablet Take 1 tablet (100 mg total) by mouth daily. 07/25/23 07/24/24 Yes Elgergawy, Leana Roe, MD  apixaban (ELIQUIS) 5 MG TABS tablet Take 1 tablet (5 mg total) by mouth 2 (two) times daily. 04/17/23  Yes Laurey Morale, MD  ARIPiprazole (ABILIFY) 5 MG tablet TAKE 1 TABLET BY MOUTH EVERYDAY AT BEDTIME 10/03/23  Yes Mozingo, Thereasa Solo, NP  atorvastatin (LIPITOR) 40 MG tablet Take 40 mg by mouth at bedtime. 07/14/23  Yes  [provider]  bisacodyl (DULCOLAX) 5 MG EC tablet Take 10 mg by mouth daily as needed for moderate constipation.   Yes [provider]  busPIRone (BUSPAR) 10 MG tablet Take 1 tablet (10 mg total) by mouth 3 (three) times daily. 04/17/23  Yes Mozingo, Thereasa Solo, NP  colchicine 0.6 MG tablet Take 1 tablet (0.6 mg total) by mouth daily. 07/12/23  Yes Love, Evlyn Kanner, PA-C  cyanocobalamin (VITAMIN B12) 1000 MCG tablet Take 1,000 mcg by mouth daily.   Yes [provider]  fenofibrate (TRICOR) 145 MG tablet Take 1 tablet (145 mg total) by mouth daily. 04/17/23  Yes Laurey Morale, MD  ferrous sulfate 325 (65 FE) MG tablet Take 325 mg by mouth daily.   Yes [provider]  fluticasone (FLONASE) 50 MCG/ACT nasal spray Place 2 sprays into both nostrils daily.    Yes [provider]  HYDROcodone-acetaminophen (NORCO/VICODIN) 5-325 MG tablet Take 1 tablet by mouth every 4 (four) hours as needed for moderate pain (pain score 4-6) or severe pain (pain score 7-10). 10/10/23  Yes Narda Bonds, MD  icosapent Ethyl (VASCEPA) 1 g capsule TAKE 2 CAPSULES BY MOUTH TWICE A DAY 10/12/23  Yes Laurey Morale, MD  Lacosamide 150 MG TABS Take 1 tablet (150 mg total) by mouth 2 (two) times daily. 10/10/23  Yes Narda Bonds, MD  levETIRAcetam (KEPPRA) 500 MG tablet Take 1 tablet (500 mg total) by mouth 2 (two) times daily. 05/04/23  Yes Windell Norfolk, MD  Menthol-Methyl Salicylate (MUSCLE RUB) 10-15 % CREA Apply 1 Application topically as needed for muscle pain. Apply 4gm to knees.   Yes [provider]  methocarbamol (ROBAXIN) 500 MG tablet Take 500 mg by mouth every 8 (eight) hours as needed for muscle spasms.   Yes [provider]  metolazone (ZAROXOLYN) 2.5 MG tablet Take 2.5 mg by mouth once a week. Every Tuesday. 09/20/23  Yes [provider]  metoprolol succinate (TOPROL-XL) 50 MG 24 hr tablet Take 50 mg by mouth daily. Take with or immediately following a meal.   Yes [provider]  mirtazapine (REMERON) 15 MG tablet Take 15 mg by mouth at bedtime. 07/14/23  Yes [provider]  omeprazole (PRILOSEC) 20 MG capsule Take 20 mg by mouth daily. 05/29/20  Yes [provider]  polyethylene glycol powder (GLYCOLAX/MIRALAX) 17 GM/SCOOP powder Mix in water as directed and take 1 capful (17 g) by mouth daily. 07/12/23  Yes Love, Evlyn Kanner, PA-C  potassium chloride SA (KLOR-CON M20) 20 MEQ tablet Take 1-2 tablets (20-40 mEq total) by mouth See admin instructions. Take 2 tablets in the morning and 1 tablet in the evening Patient taking differently: Take 20-40 mEq by mouth See admin instructions. Take 2 tablets ( ) in the morning and 1 tablet ( ) in the evening 10/10/23  Yes Narda Bonds, MD  prazosin (MINIPRESS) 2 MG capsule Take 2 mg by mouth at bedtime. 08/17/23  Yes [provider]  pregabalin (LYRICA) 150 MG capsule Take 1 capsule (150 mg total) by mouth 3 (three) times daily. 10/10/23  Yes Narda Bonds, MD  repaglinide  (PRANDIN) 2 MG tablet Take 2 mg by mouth 2 (two) times daily. 07/24/23  Yes [provider]  rOPINIRole (REQUIP) 0.25 MG tablet Take 1 tablet (0.25 mg total) by mouth 2 (two) times daily. 07/25/23  Yes Elgergawy, Leana Roe, MD  sennosides-docusate sodium (SENOKOT-S) 8.6-50 MG tablet Take 2 tablets by mouth in the  morning and at bedtime.   Yes [provider]  spironolactone (ALDACTONE) 25 MG tablet Take 12.5 mg by mouth daily. 09/01/23  Yes [provider]  Tiotropium Bromide Monohydrate (SPIRIVA RESPIMAT) 2.5 MCG/ACT AERS Inhale 2 puffs into the lungs daily.   Yes [provider]  tirzepatide (MOUNJARO) 15 MG/0.5ML Pen INJECT 15 MG INTO THE SKIN ONCE A WEEK. Patient taking differently: Inject 15 mg into the skin once a week. Inject on Wednesday 06/07/23  Yes Motwani, Carin Hock, MD  torsemide (DEMADEX) 20 MG tablet Take 3 tablets (60 mg total) by mouth 2 (two) times daily. 07/25/23  Yes Elgergawy, Leana Roe, MD  traZODone (DESYREL) 100 MG tablet Take 200 mg by mouth at bedtime. 09/10/23  Yes [provider]  venlafaxine XR (EFFEXOR-XR) 150 MG 24 hr capsule Take 150 mg by mouth daily with breakfast.   Yes [provider]  Fluticasone-Umeclidin-Vilant (TRELEGY ELLIPTA) 200-62.5-25 MCG/ACT AEPB INHALE 1 PUFF BY MOUTH INTO LUNGS ONCE DAILY Patient not taking: Reported on 10/20/2023 08/18/22   Hunsucker, Lesia Sago, MD  mupirocin ointment (BACTROBAN) 2 % Apply 1 Application topically 2 (two) times daily. Patient not taking: Reported on 10/20/2023 09/20/23   [provider]   Physical Exam: Vitals:   10/20/23 0700 10/20/23 0711 10/20/23 0714 10/20/23 0730  BP: (!) 138/108   134/72  Pulse: (!) 103   (!) 102  Resp: 18   19  Temp:   99.5 F (37.5 C)   TempSrc:   Oral   SpO2: 96% 98%  95%     General: Alert, awake, oriented x3, NAD, sitting upright in the bed, on O2 5 L via [place] Eyes: pink conjunctiva, anicteric sclera, PERLA HEENT: normocephalic,  atraumatic, oropharynx clear Neck: supple, no masses or lymphadenopathy, no JVD CVS: irregularly irregular Resp : Diminished breath sound at the bases, scattered rhonchi GI : Soft, nontender, nondistended, positive bowel sounds. No hepatomegaly.  Obese Ext: trace bilateral lower extremity edema with chronic venous stasis changes  Musculoskeletal: No clubbing or cyanosis, positive pedal pulses. No contracture. ROM intact  Neuro: Grossly intact, no focal neurological deficits, strength 5/5 upper and lower extremities bilaterally Psych: alert and oriented x 3, normal mood and affect Skin: no rashes or lesions, warm and dry   Data Reviewed: I have reviewed ED notes, Vitals, Lab results and outpatient records.   Recent Labs  Lab 10/20/23 0605  NA 134*  K 3.2*  CL 97*  CO2 27  GLUCOSE 126*  BUN 13  CREATININE 1.34*  CALCIUM 8.7*   Recent Labs  Lab 10/20/23 0605  WBC 9.2  HGB 14.1  HCT 42.1  MCV 90.3  PLT 178    Assessment and Plan Principal Problem:   Sepsis with Acute respiratory failure with hypoxia (HCC)   Influenza A with pneumonia -Met SIRS criteria on admission, presented with fevers, congestion, shortness of breath, new hypoxia on 5 L O2 via [place], not on O2 at baseline, lactic acidosis, AKI.  Chest x-ray with new bandlike opacity in the right lower lung atelectasis versus infiltrates. -Placed on Tamiflu, for now continue IV Zithromax, IV Rocephin -Follow blood cultures, obtain procalcitonin -Pulmonary hygiene, I-S, flutter valve, DuoNebs, Tessalon Perles   Active Problems:   Hypokalemia -Replaced    Acute kidney injury superimposed on stage 3a chronic kidney disease (HCC) -Baseline creatinine 1.1 -Received IV fluid bolus in ED -Will hold spironolactone, torsemide today.  Takes metolazone (takes weekly on Tuesdays), on hold  Diabetes mellitus type 2, with diabetic  polyneuropathy -Hemoglobin A1c 6.6 on 10/04/2023 -Placed on sliding scale insulin while  inpatient    Pure hypercholesterolemia -Continue statin, fenofibrate    Primary hypertension -BP currently borderline 116/75, hold spironolactone, torsemide, prazosin, Toprol-XL -Will resume Toprol-XL and torsemide as BP improves.  Chronic diastolic CHF -2D echo 03/10/2023 had shown EF of 55 to 60%, G1 DD, normal right ventricular systolic function, TAVR+ -BNP slightly elevated however no acute pulmonary edema, appears euvolemic -Given AKI, holding torsemide today, will resume in a.m. -Placed on strict I's and O's and daily weights    Seizure disorder (HCC) -Continue Keppra, lacosamide    COPD (chronic obstructive pulmonary disease) (HCC) -Currently no acute wheezing, Trelegy, DuoNebs    S/P TAVR (transcatheter aortic valve replacement) -No acute issues  Prior history of CVA (cerebral vascular accident) (HCC) -Continue statin, eliquis    Atrial fibrillation, chronic (HCC) -Heart rate currently controlled, will resume beta-blocker as BP allows -Continue eliquis    Obesity, Class III, BMI 40-49.9 (morbid obesity) (HCC) -Currently on Mounjaro outpatient  Anxiety, depression -Patient is on several psych medications, will resume -Continue trazodone, mirtazapine, BuSpar, Effexor, Abilify     Advance Care Planning:   Code Status: Limited: Do not attempt resuscitation (DNR) -DNR-LIMITED -Do Not Intubate/DNI discussed with the patient. Consults: None Family Communication: No family member at the bedside Severity of Illness:      The appropriate patient status for this patient is INPATIENT. Inpatient status is judged to be reasonable and necessary in order to provide the required intensity of service to ensure the patient's safety. The patient's presenting symptoms, physical exam findings, and initial radiographic and laboratory data in the context of their chronic comorbidities is felt to place them at high risk for further clinical deterioration. Furthermore, it is not anticipated  that the patient will be medically stable for discharge from the hospital within 2 midnights of admission.   * I certify that at the point of admission it is my clinical judgment that the patient will require inpatient hospital care spanning beyond 2 midnights from the point of admission due to high intensity of service, high risk for further deterioration and high frequency of surveillance required.*    Author: Thad Ranger, MD 10/20/2023 9:31 AM For on call review [email or web address].

## 2023-10-20 NOTE — Progress Notes (Signed)
 Transition of Care [facility]) - Inpatient Brief Assessment   Patient Details  Name: Lawrence [name] MRN: [id number] Date of Birth: Oct 22, 1955  Transition of Care [facility]) CM/SW Contact:    Oletta Cohn, RN Phone Number: 10/20/2023, 2:09 PM   Clinical Narrative: Pt active at [facility] Medical Provider: Frederick Peers: Corrie Mckusick   Desk phone: [phone] Please call [facility] transfer coordinator for additional information [phone]   Transition of Care Asessment: Insurance and Status: (P) Insurance coverage has been reviewed Patient has primary care physician: (P) Yes Home environment has been reviewed: (P) From [employer] Prior level of function:: (P) modified independent Prior/Current Home Services: (P) No current home services Social Drivers of Health Review: (P) SDOH reviewed no interventions necessary Readmission risk has been reviewed: (P) Yes Transition of care needs: (P) transition of care needs identified, TOC will continue to follow

## 2023-10-20 NOTE — Discharge Planning (Signed)
 RNCM notes veteran admission.  Called April A[facility] to get collateral information regarding [facility] SW for discharge planning coordination.  TOC will update information as it becomes available.

## 2023-10-20 NOTE — Sepsis Progress Note (Signed)
 Code Sepsis protocol being monitored by eLink.

## 2023-10-20 NOTE — ED Notes (Signed)
 Pt is on 2L 02 per [place]

## 2023-10-20 NOTE — ED Provider Notes (Signed)
 68 year old male presenting to hosptial with concern for PNA and new oxygen requirement of 4-5L  Hx of COPD, CHF, diabetes, A Fib Patient is DNR/DNI but is requesting IV fluids and/or antibiotics  Given rocephin, azithromycin, fluid bolus Blood cx ordered per Dr Bebe Shaggy, lactate 2.0  Recent hospitalization for metabolic derangement and AMS   Physical Exam  BP (!) 138/108   Pulse (!) 103   Temp 99.5 F (37.5 C) (Oral)   Resp 18   SpO2 98%   Physical Exam  Procedures  Procedures  ED Course / MDM   Clinical Course as of 10/20/23 0818  Fri Oct 20, 2023  0643 Patient extensive medical conditions, was just recently in the hospital presents with increasing shortness of breath and oxygen requirement.  Patient is also febrile suspect he has pneumonia. [DW]  [phone] Spoke to his significant other [name] at his request.  She was informed that he is back in the hospital [DW]  [phone] Patient awake and alert, currently on nasal cannula 5 L which is new.  Patient found to have pneumonia, IV fluids and antibiotics have been ordered [DW]  0732 Creatinine(!): 1.34 Renal insufficiency [DW]  0732 Signed out to dr Renaye Rakers with admission pending [DW]  0808 Influenza A By PCR(!): POSITIVE [MT]    Clinical Course User Index [DW] Zadie Rhine, MD [MT] Renaye Rakers Kermit Balo, MD   Medical Decision Making Amount and/or Complexity of Data Reviewed Labs: ordered. Decision-making details documented in ED Course. Radiology: ordered.  Risk OTC drugs. Decision regarding hospitalization.   Admitted to hospitalist service at 815 AM  Influenza Positive - hospitalist agrees to continue antibiotics for now for possible superimposed infection, she will order tamiflu       Terald Sleeper, MD 10/20/23 [phone]

## 2023-10-20 NOTE — ED Provider Notes (Signed)
 [facility] EMERGENCY DEPARTMENT AT [facility] Provider Note   CSN: [id number] Arrival date & time: 10/20/23  0542     History  Chief Complaint  Patient presents with   Shortness of Breath   Level 5 caveat due to acuity of condition Lawrence [name] is a 68 y.o. male.  The history is provided by the patient. The history is limited by the condition of the patient.  Patient with extensive history including CHF and atrial fibrillation presents with shortness of breath.  It is reported patient had increasing shortness of breath for the past 2 days.  Patient resides in a nursing home.  He was noted to be hypoxic which is new.  Patient also noted to have a fever    Home Medications Prior to Admission medications   Medication Sig Start Date End Date Taking? Authorizing Provider  albuterol (VENTOLIN HFA) 108 (90 Base) MCG/ACT inhaler Inhale 2 puffs into the lungs every 6 (six) hours as needed for wheezing or shortness of breath. 04/17/23   Hunsucker, Lesia Sago, MD  allopurinol (ZYLOPRIM) 100 MG tablet Take 1 tablet (100 mg total) by mouth daily. 07/25/23 07/24/24  Elgergawy, Leana Roe, MD  apixaban (ELIQUIS) 5 MG TABS tablet Take 1 tablet (5 mg total) by mouth 2 (two) times daily. 04/17/23   Laurey Morale, MD  ARIPiprazole (ABILIFY) 5 MG tablet TAKE 1 TABLET BY MOUTH EVERYDAY AT BEDTIME 10/03/23   Mozingo, Thereasa Solo, NP  atorvastatin (LIPITOR) 40 MG tablet Take 40 mg by mouth at bedtime. 07/14/23   [provider]  busPIRone (BUSPAR) 10 MG tablet Take 1 tablet (10 mg total) by mouth 3 (three) times daily. 04/17/23   Mozingo, Thereasa Solo, NP  colchicine 0.6 MG tablet Take 1 tablet (0.6 mg total) by mouth daily. 07/12/23   Love, Evlyn Kanner, PA-C  cyanocobalamin (VITAMIN B12) 1000 MCG tablet Take 1,000 mcg by mouth daily.    [provider]  fenofibrate (TRICOR) 145 MG tablet Take 1 tablet (145 mg total) by mouth daily. 04/17/23   Laurey Morale, MD  ferrous  sulfate 324 (65 Fe) MG TBEC Take 324 mg by mouth daily with breakfast.     [provider]  fluticasone (FLONASE) 50 MCG/ACT nasal spray Place 2 sprays into both nostrils daily.     [provider]  Fluticasone-Umeclidin-Vilant (TRELEGY ELLIPTA) 200-62.5-25 MCG/ACT AEPB INHALE 1 PUFF BY MOUTH INTO LUNGS ONCE DAILY 08/18/22   Hunsucker, Lesia Sago, MD  HYDROcodone-acetaminophen (NORCO/VICODIN) 5-325 MG tablet Take 1 tablet by mouth every 4 (four) hours as needed for moderate pain (pain score 4-6) or severe pain (pain score 7-10). Patient not taking: Reported on 10/10/2023 10/10/23   Narda Bonds, MD  icosapent Ethyl (VASCEPA) 1 g capsule TAKE 2 CAPSULES BY MOUTH TWICE A DAY 10/12/23   Laurey Morale, MD  Lacosamide 150 MG TABS Take 1 tablet (150 mg total) by mouth 2 (two) times daily. 10/10/23   Narda Bonds, MD  levETIRAcetam (KEPPRA) 500 MG tablet Take 1 tablet (500 mg total) by mouth 2 (two) times daily. 05/04/23   Windell Norfolk, MD  metolazone (ZAROXOLYN) 2.5 MG tablet Take 2.5 mg by mouth once a week. 09/20/23   [provider]  metoprolol succinate (TOPROL-XL) 25 MG 24 hr tablet Take 1 tablet (25 mg total) by mouth daily. 10/10/23   Narda Bonds, MD  mirtazapine (REMERON) 15 MG tablet Take 15 mg by mouth at bedtime. 07/14/23   [provider]  mupirocin ointment (BACTROBAN) 2 % Apply 1 Application topically 2 (two) times daily. 09/20/23   [provider]  omeprazole (PRILOSEC) 20 MG capsule Take 20 mg by mouth daily. 05/29/20   [provider]  polyethylene glycol powder (GLYCOLAX/MIRALAX) 17 GM/SCOOP powder Mix in water as directed and take 1 capful (17 g) by mouth daily. Patient taking differently: Take 17 g by mouth daily as needed (constipation). 07/12/23   Love, Evlyn Kanner, PA-C  potassium chloride SA (KLOR-CON M20) 20 MEQ tablet Take 1-2 tablets (20-40 mEq total) by mouth See admin instructions. Take 2 tablets in the morning and 1 tablet in the  evening 10/10/23   Narda Bonds, MD  prazosin (MINIPRESS) 2 MG capsule Take 2 mg by mouth at bedtime. 08/17/23   [provider]  pregabalin (LYRICA) 150 MG capsule Take 1 capsule (150 mg total) by mouth 3 (three) times daily. 10/10/23   Narda Bonds, MD  repaglinide (PRANDIN) 2 MG tablet Take 4 mg by mouth 2 (two) times daily. 07/24/23   [provider]  rOPINIRole (REQUIP) 0.25 MG tablet Take 1 tablet (0.25 mg total) by mouth 2 (two) times daily. 07/25/23   Elgergawy, Leana Roe, MD  spironolactone (ALDACTONE) 25 MG tablet Take 12.5 mg by mouth daily. 09/01/23   [provider]  tirzepatide Greggory Keen) 15 MG/0.5ML Pen INJECT 15 MG INTO THE SKIN ONCE A WEEK. Patient taking differently: 15 mg once a week. Inject on Monday 06/07/23   Altamese Kingsville, MD  torsemide (DEMADEX) 20 MG tablet Take 3 tablets (60 mg total) by mouth 2 (two) times daily. 07/25/23   Elgergawy, Leana Roe, MD  traZODone (DESYREL) 100 MG tablet Take 200 mg by mouth at bedtime. 09/10/23   [provider]      Allergies    Niacin and related; Tubersol [tuberculin, ppd]; Firvanq [vancomycin]; and Vibramycin [doxycycline]    Review of Systems   Review of Systems  Unable to perform ROS: Acuity of condition    Physical Exam Updated Vital Signs BP (!) 138/108   Pulse (!) 103   Temp 99.5 F (37.5 C) (Oral)   Resp 18   SpO2 98%  Physical Exam CONSTITUTIONAL: Ill-appearing, distressed HEAD: Normocephalic/atraumatic ENMT: Mucous membranes dry, no stridor, no drooling NECK: supple no meningeal signs CV: S1/S2 noted, no murmurs/rubs/gallops noted LUNGS: Tachypnea, decreased breath sounds bilaterally ABDOMEN: soft, nontender, obese GU exam performed nurse present, no scrotal edema or erythema, No wounds are noted to the buttocks NEURO: Pt is awake/alert, he appears confused, moves all extremities EXTREMITIES: pulses normal/equal in both feet, full ROM, no deformities SKIN: warm,  ED Results /  Procedures / Treatments   Labs (all labs ordered are listed, but only abnormal results are displayed) Labs Reviewed  COMPREHENSIVE METABOLIC PANEL - Abnormal; Notable for the following components:      Result Value   Sodium 134 (*)    Potassium 3.2 (*)    Chloride 97 (*)    Glucose, Bld 126 (*)    Creatinine, Ser 1.34 (*)    Calcium 8.7 (*)    GFR, Estimated 58 (*)    All other components within normal limits  BRAIN NATRIURETIC PEPTIDE - Abnormal; Notable for the following components:   B Natriuretic Peptide 235.5 (*)    All other components within normal limits  I-STAT CG4 LACTIC ACID, ED - Abnormal; Notable for the following components:   Lactic Acid, Venous 2.0 (*)    All other components  within normal limits  RESP PANEL BY RT-PCR (RSV, FLU A&B, COVID)  RVPGX2  CULTURE, BLOOD (ROUTINE X 2)  CULTURE, BLOOD (ROUTINE X 2)  CBC  I-STAT CG4 LACTIC ACID, ED    EKG EKG Interpretation Date/Time:  Friday October 20 2023 05:48:45 EDT Ventricular Rate:  104 PR Interval:    QRS Duration:  126 QT Interval:  353 QTC Calculation: 432 R Axis:   -28  Text Interpretation: indeterminate rhythm suspect afib Ventricular bigeminy Nonspecific intraventricular conduction delay Probable anterior infarct, age indeterminate Confirmed by Zadie Rhine ([id number]) on 10/20/2023 5:54:37 AM  Radiology DG Chest Port 1 View Result Date: 10/20/2023 CLINICAL DATA:  Shortness of breath.  History of COPD, CHF and AFib. EXAM: PORTABLE CHEST 1 VIEW COMPARISON:  10/02/2023 FINDINGS: Stable cardiomediastinal contours. New bandlike opacity within the right lower lung is identified which may reflect atelectasis and or airspace disease. No signs of pleural effusion or interstitial edema. Visualized osseous structures appear grossly intact. IMPRESSION: New bandlike opacity within the right lower lung may reflect atelectasis and/or airspace disease. Electronically Signed   By: Signa Kell M.D.   On: 10/20/2023 07:00     Procedures .Critical Care  Performed by: Zadie Rhine, MD Authorized by: Zadie Rhine, MD   Critical care provider statement:    Critical care time (minutes):  40   Critical care start time:  10/20/2023 5:50 AM   Critical care end time:  10/20/2023 6:30 AM   Critical care time was exclusive of:  Separately billable procedures and treating other patients   Critical care was necessary to treat or prevent imminent or life-threatening deterioration of the following conditions:  Respiratory failure and sepsis   Critical care was time spent personally by me on the following activities:  Development of treatment plan with patient or surrogate, examination of patient, re-evaluation of patient's condition, pulse oximetry, ordering and review of radiographic studies, ordering and review of laboratory studies, ordering and performing treatments and interventions, review of old charts, evaluation of patient's response to treatment and obtaining history from patient or surrogate   I assumed direction of critical care for this patient from another provider in my specialty: no       Medications Ordered in ED Medications  azithromycin (ZITHROMAX) 500 mg in sodium chloride 0.9 % 250 mL IVPB (500 mg Intravenous New Bag/Given 10/20/23 0710)  sodium chloride 0.9 % bolus 1,000 mL (1,000 mLs Intravenous New Bag/Given 10/20/23 0645)  cefTRIAXone (ROCEPHIN) 1 g in sodium chloride 0.9 % 100 mL IVPB (0 g Intravenous Stopped 10/20/23 0711)  acetaminophen (TYLENOL) suppository 650 mg (650 mg Rectal Given 10/20/23 6045)    ED Course/ Medical Decision Making/ A&P Clinical Course as of 10/20/23 0733  Fri Oct 20, 2023  0643 Patient extensive medical conditions, was just recently in the hospital presents with increasing shortness of breath and oxygen requirement.  Patient is also febrile suspect he has pneumonia. [DW]  [phone] Spoke to his significant other [name] at his request.  She was informed that he is back in  the hospital [DW]  [phone] Patient awake and alert, currently on nasal cannula 5 L which is new.  Patient found to have pneumonia, IV fluids and antibiotics have been ordered [DW]  0732 Creatinine(!): 1.34 Renal insufficiency [DW]  0732 Signed out to dr Renaye Rakers with admission pending [DW]    Clinical Course User Index [DW] Zadie Rhine, MD  Medical Decision Making Amount and/or Complexity of Data Reviewed Labs: ordered. Decision-making details documented in ED Course. Radiology: ordered.  Risk OTC drugs. Decision regarding hospitalization.   This patient presents to the ED for concern of shortness of breath, this involves an extensive number of treatment options, and is a complaint that carries with it a high risk of complications and morbidity.  The differential diagnosis includes but is not limited to Acute coronary syndrome, pneumonia, acute pulmonary edema, pneumothorax, acute anemia, pulmonary embolism    Comorbidities that complicate the patient evaluation: Patient's presentation is complicated by their history of CHF, obesity  Social Determinants of Health: Patient resides in a nursing home   increases the complexity of managing their presentation  Additional history obtained: Additional history obtained from  significant other Records reviewed previous admission documents  Lab Tests: I Ordered, and personally interpreted labs.  The pertinent results include: Elevated lactate  Imaging Studies ordered: I ordered imaging studies including X-ray chest   I independently visualized and interpreted imaging which showed pneumonia I agree with the radiologist interpretation  Cardiac Monitoring: The patient was maintained on a cardiac monitor.  I personally viewed and interpreted the cardiac monitor which showed an underlying rhythm of:   Indeterminate  Medicines ordered and prescription drug management: I ordered medication including Tylenol  for fever Reevaluation of the patient after these medicines showed that the patient    improved   Critical Interventions:   IV fluids and antibiotics   Reevaluation: After the interventions noted above, I reevaluated the patient and found that they have :improved  Complexity of problems addressed: Patient's presentation is most consistent with  acute presentation with potential threat to life or bodily function  Disposition: After consideration of the diagnostic results and the patient's response to treatment,  I feel that the patent would benefit from admission   .           Final Clinical Impression(s) / ED Diagnoses Final diagnoses:  Acute respiratory failure with hypoxia (HCC)  Community acquired pneumonia of right lower lobe of lung    Rx / DC Orders ED Discharge Orders     None         Zadie Rhine, MD 10/20/23 [phone]

## 2023-10-21 DIAGNOSIS — J189 Pneumonia, unspecified organism: Secondary | ICD-10-CM | POA: Diagnosis not present

## 2023-10-21 DIAGNOSIS — J9601 Acute respiratory failure with hypoxia: Secondary | ICD-10-CM | POA: Diagnosis not present

## 2023-10-21 DIAGNOSIS — E66813 Obesity, class 3: Secondary | ICD-10-CM

## 2023-10-21 DIAGNOSIS — N179 Acute kidney failure, unspecified: Secondary | ICD-10-CM | POA: Diagnosis not present

## 2023-10-21 DIAGNOSIS — J101 Influenza due to other identified influenza virus with other respiratory manifestations: Secondary | ICD-10-CM | POA: Diagnosis not present

## 2023-10-21 DIAGNOSIS — J440 Chronic obstructive pulmonary disease with acute lower respiratory infection: Secondary | ICD-10-CM

## 2023-10-21 LAB — BLOOD CULTURE ID PANEL (REFLEXED) - BCID2

## 2023-10-21 LAB — CBC
HCT: 37.7 % — ABNORMAL LOW (ref 39.0–52.0)
Hemoglobin: 12.5 g/dL — ABNORMAL LOW (ref 13.0–17.0)
MCH: 29.7 pg (ref 26.0–34.0)
MCHC: 33.2 g/dL (ref 30.0–36.0)
MCV: 89.5 fL (ref 80.0–100.0)
Platelets: 179 10*3/uL (ref 150–400)
RBC: 4.21 MIL/uL — ABNORMAL LOW (ref 4.22–5.81)
RDW: 15.4 % (ref 11.5–15.5)
WBC: 5.5 10*3/uL (ref 4.0–10.5)
nRBC: 0 % (ref 0.0–0.2)

## 2023-10-21 LAB — BASIC METABOLIC PANEL
Anion gap: 10 (ref 5–15)
BUN: 11 mg/dL (ref 8–23)
CO2: 25 mmol/L (ref 22–32)
Calcium: 8.7 mg/dL — ABNORMAL LOW (ref 8.9–10.3)
Chloride: 104 mmol/L (ref 98–111)
Creatinine, Ser: 0.99 mg/dL (ref 0.61–1.24)
GFR, Estimated: >60 mL/min (ref >60)
Glucose, Bld: 85 mg/dL (ref 70–99)
Potassium: 3.4 mmol/L — ABNORMAL LOW (ref 3.5–5.1)
Sodium: 139 mmol/L (ref 135–145)

## 2023-10-21 LAB — LEGIONELLA PNEUMOPHILA SEROGP 1 UR AG: L. pneumophila Serogp 1 Ur Ag: NEGATIVE

## 2023-10-21 LAB — GLUCOSE, CAPILLARY
Glucose-Capillary: 93 mg/dL (ref 70–99)
Glucose-Capillary: 152 mg/dL — ABNORMAL HIGH (ref 70–99)
Glucose-Capillary: 231 mg/dL — ABNORMAL HIGH (ref 70–99)
Glucose-Capillary: 168 mg/dL — ABNORMAL HIGH (ref 70–99)

## 2023-10-21 LAB — BRAIN NATRIURETIC PEPTIDE: B Natriuretic Peptide: 98 pg/mL (ref 0.0–100.0)

## 2023-10-21 MED ORDER — IPRATROPIUM-ALBUTEROL 0.5-2.5 (3) MG/3ML IN SOLN: 3.0000 mL | RESPIRATORY_TRACT | Status: DC | PRN

## 2023-10-21 MED ORDER — METHYLPREDNISOLONE SODIUM SUCC 125 MG IJ SOLR
60.0000 mg | Freq: Two times a day (BID) | INTRAMUSCULAR | Status: DC
  Administered 2023-10-21 – 2023-10-22 (×3): 60 mg via INTRAVENOUS
  Filled 2023-10-21 (×3): qty 2

## 2023-10-21 MED ORDER — HYDROCODONE-ACETAMINOPHEN 5-325 MG PO TABS
1.0000 | ORAL_TABLET | Freq: Four times a day (QID) | ORAL | Status: DC | PRN
  Administered 2023-10-21 – 2023-10-25 (×12): 1 via ORAL
  Filled 2023-10-21 (×12): qty 1

## 2023-10-21 MED ORDER — TRAMADOL HCL 50 MG PO TABS
50.0000 mg | ORAL_TABLET | Freq: Once | ORAL | Status: AC | PRN
  Administered 2023-10-21: 50 mg via ORAL
  Filled 2023-10-21: qty 1

## 2023-10-21 MED ORDER — TORSEMIDE 20 MG PO TABS
60.0000 mg | ORAL_TABLET | Freq: Two times a day (BID) | ORAL | Status: DC
  Administered 2023-10-21 – 2023-10-25 (×9): 60 mg via ORAL
  Filled 2023-10-21 (×9): qty 3

## 2023-10-21 MED ORDER — SPIRONOLACTONE 12.5 MG HALF TABLET
12.5000 mg | ORAL_TABLET | Freq: Every day | ORAL | Status: DC
  Administered 2023-10-21 – 2023-10-25 (×5): 12.5 mg via ORAL
  Filled 2023-10-21 (×5): qty 1

## 2023-10-21 MED ORDER — ARFORMOTEROL TARTRATE 15 MCG/2ML IN NEBU
15.0000 ug | INHALATION_SOLUTION | Freq: Two times a day (BID) | RESPIRATORY_TRACT | Status: DC
  Administered 2023-10-21 – 2023-10-25 (×9): 15 ug via RESPIRATORY_TRACT
  Filled 2023-10-21 (×9): qty 2

## 2023-10-21 MED ORDER — POTASSIUM CHLORIDE CRYS ER 20 MEQ PO TBCR
40.0000 meq | EXTENDED_RELEASE_TABLET | Freq: Once | ORAL | Status: AC
  Administered 2023-10-21: 40 meq via ORAL
  Filled 2023-10-21: qty 2

## 2023-10-21 MED ORDER — BUDESONIDE 0.25 MG/2ML IN SUSP
0.2500 mg | Freq: Two times a day (BID) | RESPIRATORY_TRACT | Status: DC
  Administered 2023-10-21 – 2023-10-25 (×9): 0.25 mg via RESPIRATORY_TRACT
  Filled 2023-10-21 (×9): qty 2

## 2023-10-21 NOTE — Progress Notes (Signed)
 [facility]                                                                              Becky Berberian, is a 68 y.o. male, DOB - 02-Mar-1956, WNU:[id number] Admit date - 10/20/2023    Outpatient Primary MD for the patient is [name], PA-C  LOS - 1  days  Chief Complaint  Patient presents with   Shortness of Breath       Brief summary   Patient is a 68 y.o. male with PMH of COPD, severe aortic stenosis status post TAVR in July 2024, seizure disorder, chronic diastolic CHF (follows Dr. Shirlee Latch), atrial fibrillation on Eliquis, gout, anxiety, BPH, chronic venous stasis, morbid obesity presented from SNF with worsening shortness of breath in the last 2 days.  Patient is somewhat poor historian, reports that he was having subjective fevers and chills, shortness of breath, sleeps on the hospital bed sitting upright, felt congested.  He was sent to the ED and was noted to be hypoxic with O2 sats 88% by the EMS.  Per patient, at baseline he does not use any O2. No nausea vomiting, chest pain or any diarrhea.  In ED, patient was noted to have temp of 102.2 F, RR 24, heart rate 98-103, BP 153/71, O2 sats 96% on 5 L O2 via [place]  Lactic acid 2.0, BNP 235 Respiratory virus panel showed influenza A positive.  RSV, COVID-negative. Chest x-ray showed new bandlike opacity in the right lower lung may reflect atelectasis and/or airspace disease.   Assessment & Plan    Principal Problem:     Sepsis with Acute respiratory failure with hypoxia (HCC)   Influenza A with pneumonia -Met SIRS criteria on admission, presented with fevers, congestion, shortness of breath, new hypoxia on 5 L O2 via [place], not on O2 at baseline, lactic acidosis, AKI.  Chest x-ray with new bandlike opacity in the right lower lung atelectasis versus infiltrates. -Continue Tamiflu.,  IV Zithromax, IV Rocephin.   -Urine strep antigen negative  -BC ID Staph species+ -Pulmonary hygiene, I-S, flutter valve,  DuoNebs, Tessalon Perles  -Continue to wean O2 as tolerated, O2 sats 94% on 3 L   Active Problems: COPD with acute exacerbation -Noted diffuse wheezing today bilaterally, possibly acute COPD exacerbation with pulmonary edema -Resumed torsemide, placed on scheduled DuoNebs, Pulmicort, Brovana -Currently on IV antibiotics, placed on IV Solu-Medrol 60 mg every 12 hours -Flutter valve, I-S    Hypokalemia -Replaced     Acute kidney injury superimposed on stage 3a chronic kidney disease (HCC) -Baseline creatinine 1.1, creatinine 1.3 on admission, lactic acidosis, received IV fluid bolus in ED -Creatinine stable, 0.9, resumed torsemide 60 mg twice daily   Diabetes mellitus type 2, with diabetic polyneuropathy -Hemoglobin A1c 6.6 on 10/04/2023 -Placed on sliding scale insulin while inpatient CBG (last 3)  Recent Labs    10/20/23 2104 10/21/23 0611 10/21/23 1204  GLUCAP 112* 93 152*        Pure hypercholesterolemia -Continue statin, fenofibrate     Primary hypertension -In ED, BP borderline, held spironolactone, torsemide, prazosin, Toprol-XL -Resumed Toprol-XL, torsemide.   Chronic diastolic CHF -2D echo  03/10/2023 had shown EF of 55 to 60%, G1 DD, normal right ventricular systolic function, TAVR+ -BNP slightly elevated however no acute pulmonary edema, volume status difficult to assess due to morbid obesity and ongoing pulmonary issues -Appreciate cardiology evaluation, resumed torsemide, cardiology added spironolactone today     Seizure disorder (HCC) -Continue Keppra, lacosamide       S/P TAVR (transcatheter aortic valve replacement) -No acute issues   Prior history of CVA (cerebral vascular accident) (HCC) -Continue statin, eliquis     Atrial fibrillation, chronic (HCC) -Heart rate controlled, resumed Toprol-XL -Continue eliquis     Obesity, Class III, BMI 40-49.9 (morbid obesity) (HCC) -Currently on Mounjaro outpatient   Anxiety, depression -Patient is on several  psych medications, will resume -Continue trazodone, mirtazapine, BuSpar, Effexor, Abilify   Obesity class III Estimated body mass index is 44.5 kg/m as calculated from the following:   Height as of this encounter: 6\' 2"  (1.88 m).   Weight as of this encounter: 157.2 kg.  Code Status: DNR DVT Prophylaxis:   apixaban (ELIQUIS) tablet 5 mg   Level of Care: Level of care: Progressive Family Communication: Updated patient Disposition Plan:      Remains inpatient appropriate:      Procedures:    Consultants:   Cardiology  Antimicrobials:   Anti-infectives (From admission, onward)    Start     Dose/Rate Route Frequency Ordered Stop   10/21/23 1000  azithromycin (ZITHROMAX) 500 mg in sodium chloride 0.9 % 250 mL IVPB        500 mg 250 mL/hr over 60 Minutes Intravenous Every 24 hours 10/20/23 1044 10/25/23 0959   10/21/23 1000  cefTRIAXone (ROCEPHIN) 2 g in sodium chloride 0.9 % 100 mL IVPB        2 g 200 mL/hr over 30 Minutes Intravenous Every 24 hours 10/20/23 1045 10/25/23 0959   10/20/23 1000  cefTRIAXone (ROCEPHIN) 2 g in sodium chloride 0.9 % 100 mL IVPB  Status:  Discontinued        2 g 200 mL/hr over 30 Minutes Intravenous Every 24 hours 10/20/23 0954 10/20/23 1045   10/20/23 1000  azithromycin (ZITHROMAX) 500 mg in sodium chloride 0.9 % 250 mL IVPB  Status:  Discontinued        500 mg 250 mL/hr over 60 Minutes Intravenous Every 24 hours 10/20/23 0954 10/20/23 1043   10/20/23 0830  oseltamivir (TAMIFLU) capsule 75 mg        75 mg Oral 2 times daily 10/20/23 0820 10/25/23 0959   10/20/23 0645  cefTRIAXone (ROCEPHIN) 1 g in sodium chloride 0.9 % 100 mL IVPB        1 g 200 mL/hr over 30 Minutes Intravenous  Once 10/20/23 0637 10/20/23 0711   10/20/23 0645  azithromycin (ZITHROMAX) 500 mg in sodium chloride 0.9 % 250 mL IVPB        500 mg 250 mL/hr over 60 Minutes Intravenous  Once 10/20/23 0637 10/20/23 0829          Medications  allopurinol  100 mg Oral Daily    apixaban  5 mg Oral BID   arformoterol  15 mcg Nebulization BID   ARIPiprazole  5 mg Oral QHS   atorvastatin  40 mg Oral QHS   benzonatate  200 mg Oral TID   budesonide (PULMICORT) nebulizer solution  0.25 mg Nebulization BID   busPIRone  10 mg Oral TID   colchicine  0.6 mg Oral Daily   cyanocobalamin  1,000 mcg Oral  Daily   fenofibrate  160 mg Oral Daily   ferrous sulfate  325 mg Oral Daily   fluticasone  2 spray Each Nare Daily   icosapent Ethyl  2 g Oral BID   insulin aspart  0-5 Units Subcutaneous QHS   insulin aspart  0-9 Units Subcutaneous TID WC   lacosamide  150 mg Oral BID   levETIRAcetam  500 mg Oral BID   methylPREDNISolone (SOLU-MEDROL) injection  60 mg Intravenous Q12H   metoprolol succinate  25 mg Oral Daily   mirtazapine  15 mg Oral QHS   oseltamivir  75 mg Oral BID   pantoprazole  40 mg Oral Daily   polyethylene glycol  17 g Oral Daily   pregabalin  150 mg Oral TID   repaglinide  2 mg Oral BID   rOPINIRole  0.25 mg Oral BID   senna-docusate  2 tablet Oral BID   sodium chloride flush  3 mL Intravenous Q12H   spironolactone  12.5 mg Oral Daily   torsemide  60 mg Oral BID   traZODone  200 mg Oral QHS   venlafaxine XR  150 mg Oral Q breakfast      Subjective:   Lawrence [name] was seen and examined today.  Today O2 sats improving however diffuse wheezing bilaterally, still has shortness of breath.  No chest pain, fever chills nausea or vomiting.  Objective:   Vitals:   10/21/23 0742 10/21/23 0748 10/21/23 0749 10/21/23 0751  BP: (!) 143/81     Pulse: (!) 47     Resp: 20     Temp: 98.3 F (36.8 C)     TempSrc: Oral     SpO2: 100% 93% 94% 94%  Weight:      Height:        Intake/Output Summary (Last 24 hours) at 10/21/2023 1239 Last data filed at 10/21/2023 0742 Gross per 24 hour  Intake 897 ml  Output 3400 ml  Net -2503 ml     Wt Readings from Last 3 Encounters:  10/21/23 (!) 157.2 kg  10/10/23 (!) 157 kg  10/07/23 (!) 156.6 kg      Exam General: Alert and oriented x 3, NAD Cardiovascular: S1 S2 auscultated,  RRR Respiratory: Bilateral expiratory wheezing with bibasilar crackles Gastrointestinal: Soft, nontender, nondistended, + bowel sounds Ext: trace pedal edema bilaterally, chronic venous stasis Neuro: no new deficits Psych: Normal affect     Data Reviewed:  I have personally reviewed following labs    CBC Lab Results  Component Value Date   WBC 5.5 10/21/2023   RBC 4.21 (L) 10/21/2023   HGB 12.5 (L) 10/21/2023   HCT 37.7 (L) 10/21/2023   MCV 89.5 10/21/2023   MCH 29.7 10/21/2023   PLT 179 10/21/2023   MCHC 33.2 10/21/2023   RDW 15.4 10/21/2023   LYMPHSABS 1.7 10/03/2023   MONOABS 0.8 10/03/2023   EOSABS 0.4 10/03/2023   BASOSABS 0.0 10/03/2023     Last metabolic panel Lab Results  Component Value Date   NA 139 10/21/2023   K 3.4 (L) 10/21/2023   CL 104 10/21/2023   CO2 25 10/21/2023   BUN 11 10/21/2023   CREATININE 0.99 10/21/2023   GLUCOSE 85 10/21/2023   GFRNONAA >60 10/21/2023   GFRAA 77 05/20/2020   CALCIUM 8.7 (L) 10/21/2023   PHOS 3.1 07/23/2023   PROT 6.5 10/20/2023   ALBUMIN 3.6 10/20/2023   LABGLOB 2.8 02/08/2023   BILITOT 0.7 10/20/2023   ALKPHOS 46 10/20/2023   AST  26 10/20/2023   ALT 28 10/20/2023   ANIONGAP 10 10/21/2023    CBG (last 3)  Recent Labs    10/20/23 2104 10/21/23 0611 10/21/23 1204  GLUCAP 112* 93 152*      Coagulation Profile: No results for input(s): "INR", "PROTIME" in the last 168 hours.   Radiology Studies: I have personally reviewed the imaging studies  DG Chest Port 1 View Result Date: 10/20/2023 CLINICAL DATA:  Shortness of breath.  History of COPD, CHF and AFib. EXAM: PORTABLE CHEST 1 VIEW COMPARISON:  10/02/2023 FINDINGS: Stable cardiomediastinal contours. New bandlike opacity within the right lower lung is identified which may reflect atelectasis and or airspace disease. No signs of pleural effusion or interstitial edema.  Visualized osseous structures appear grossly intact. IMPRESSION: New bandlike opacity within the right lower lung may reflect atelectasis and/or airspace disease. Electronically Signed   By: Signa Kell M.D.   On: 10/20/2023 07:00       Jemar Paulsen M.D. [facility] 10/21/2023, 12:39 PM  Available via Epic secure chat 7am-7pm After 7 pm, please refer to night coverage provider listed on amion.

## 2023-10-21 NOTE — Plan of Care (Signed)
  Problem: Education: Goal: Ability to describe self-care measures that may prevent or decrease complications (Diabetes Survival Skills Education) will improve Outcome: Progressing   Problem: Fluid Volume: Goal: Ability to maintain a balanced intake and output will improve Outcome: Progressing   Problem: Nutritional: Goal: Progress toward achieving an optimal weight will improve Outcome: Progressing   Problem: Skin Integrity: Goal: Risk for impaired skin integrity will decrease Outcome: Progressing   Problem: Nutritional: Goal: Maintenance of adequate nutrition will improve Outcome: Progressing

## 2023-10-21 NOTE — Consult Note (Addendum)
 Cardiology Consultation   Patient ID: Lawrence [name] MRN: [id number]; DOB: July 15, 1956  Admit date: 10/20/2023 Date of Consult: 10/21/2023  PCP:  [name], PA-C   Lawrence [name] Cardiologist:  Armanda Magic, MD  Electrophysiologist:  Regan Lemming, MD  Advanced Heart Failure:  Marca Ancona, MD   Patient Profile:   Lawrence [name] is a 68 y.o. male with a hx of chronic HFpEF, paroxysmal atrial fibrillation, aortic stenosis status post TAVR 02/2023, left saphenous vein laser ablation on 11/2022 PVCs, seizures, hypertension, hyperlipidemia, type 2 diabetes, COPD, OSA uses BiPAP, CVA 2024 who is being seen 10/21/2023 for the evaluation of possible CHF at the request of Dr. Isidoro Donning.  History of Present Illness:   Lawrence [name] followed by advanced heart failure and last seen 06/09/2023.  This year in June he was admitted for CHF exacerbation and severe aortic stenosis.  He had severe AS with a mean gradient of 40 and a VTI of 1.03.  Preoperative heart catheterization showed mild to moderate diffuse nonobstructive CAD with normal cardiac output with slightly elevated filling pressures.  During that admission he had significant diuresis over 30 L.  Eventually underwent TAVR 34 mm Medtronic Evolut FX THV via the TF approach on 02/07/23.  Postoperatively he had done well with no complications and had post mean gradient of 17.  He was discharged with a heart monitor given new left bundle branch block and preoperative right bundle branch block.  He started being worked up for amyloidosis.  Multiple myeloma panel did not show any M spike protein but did show elevated kappa free light chain at 23 however normal kappa lambda ratio.  PYP scan was ordered and was equivocal with no further recommendations to continue workup besides follow-up PYP scan in 1 to 2 years.  Additionally, he has been seen on various occasions throughout this year for heart failure exacerbation and requires  high-dose torsemide 120 mg twice daily along with [place] and spironolactone.  Also has had a heart monitor that showed frequent PVCs approximately 9% burden.  He does not ambulate very well given his large size.  We last saw patient in the hospital November 2024 when he was admitted for stroke but there was concern of wide-complex tachycardia felt to be related to artifact.  He was also wearing a heart monitor and during this time that showed PVC burden of around 32%.  He was supposed to follow-up with advanced heart failure but has not been seen.  Currently patient admitted for flu/sepsis noted to be febrile, hypoxic requiring supplemental oxygen via nasal cannula.  Lactic acid 2.0, normalized now.  Cardiology asked to see due to concerns of CHF component.  He had mild AKI on arrival so his diuretics were held.  On interview patient is somewhat confused although does not note any significant complaints at this time.  He just got a flutter valve treatment and feels that his shortness of breath has improved some but still looks visibly uncomfortable and lethargic.  Struggles to answer some questions and somnolent on exam.  However per his report does not note any significant chest pain, peripheral edema, mild orthopnea, significant shortness of breath.  He has been compliant with all of his medications at home  BNP 235.  Chest x-ray showing right lower lung atelectasis versus airspace disease.  Blood cultures pending.  Procalcitonin 0.14.  Potassium 3.4, repleted.  Creatinine 0.99.  WBC 5.5.  Hemoglobin 12.5.  Past Medical History:  Diagnosis Date  Acquired dilation of ascending aorta and aortic root (HCC)    40mm by echo 01/2021   Adenomatous colon polyp 2007   Anemia    Anxiety    Asthma    BPH without obstruction/lower urinary tract symptoms 02/22/2017   Chronic diastolic (congestive) heart failure (HCC)    Chronic venous stasis 03/07/2019   COPD (chronic obstructive pulmonary disease) (HCC)     Coronary artery calcification seen on CAT scan    Depression    Diabetic neuropathy (HCC) 09/11/2019   History of colon polyps 08/24/2018   Hypertension    Morbid obesity (HCC)    OSA (obstructive sleep apnea)    Pain due to onychomycosis of toenails of both feet 09/11/2019   Peripheral neuropathy 02/22/2017   Primary osteoarthritis, left shoulder 03/05/2017   PTSD (post-traumatic stress disorder)    Pure hypercholesterolemia    QT prolongation 03/07/2019   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 Lynnette Caffey and Dr. Laneta Simmers   Seizures [facility])    Severe aortic stenosis    Sinus tachycardia 03/07/2019   Sleep apnea    CPAP   Type 2 diabetes mellitus with vascular disease (HCC) 09/11/2019    Past Surgical History:  Procedure Laterality Date   ENDOVENOUS ABLATION SAPHENOUS VEIN W/ LASER Right 08/20/2020   endovenous laser ablation right greater saphenous vein by Cari Caraway MD    ENDOVENOUS ABLATION SAPHENOUS VEIN W/ LASER Left 11/16/2022   endovenous laser ablation left greater saphenous vein by Cari Caraway MD   INTRAOPERATIVE TRANSTHORACIC ECHOCARDIOGRAM N/A 02/07/2023   Procedure: INTRAOPERATIVE TRANSTHORACIC ECHOCARDIOGRAM;  Surgeon: Orbie Pyo, MD;  Location: [facility] INVASIVE CV LAB;  Service: Open Heart Surgery;  Laterality: N/A;   JOINT REPLACEMENT     left knee replacement x 2   KNEE ARTHROSCOPY Bilateral    LEFT HEART CATH AND CORONARY ANGIOGRAPHY N/A 01/17/2018   Procedure: LEFT HEART CATH AND CORONARY ANGIOGRAPHY;  Surgeon: Marykay Lex, MD;  Location: [facility] INVASIVE CV LAB;  Service: Cardiovascular;  Laterality: N/A;   PRESSURE SENSOR/CARDIOMEMS N/A 08/26/2021   Procedure: PRESSURE SENSOR/CARDIOMEMS;  Surgeon: Laurey Morale, MD;  Location: [facility] INVASIVE CV LAB;  Service: Cardiovascular;  Laterality: N/A;   RIGHT HEART CATH N/A 08/26/2021   Procedure: RIGHT HEART CATH;  Surgeon: Laurey Morale, MD;   Location: [facility] INVASIVE CV LAB;  Service: Cardiovascular;  Laterality: N/A;   RIGHT HEART CATH AND CORONARY ANGIOGRAPHY N/A 01/26/2023   Procedure: RIGHT HEART CATH AND CORONARY ANGIOGRAPHY;  Surgeon: Orbie Pyo, MD;  Location: [facility] INVASIVE CV LAB;  Service: Cardiovascular;  Laterality: N/A;   TEE WITHOUT CARDIOVERSION N/A 08/26/2021   Procedure: TRANSESOPHAGEAL ECHOCARDIOGRAM (TEE);  Surgeon: Laurey Morale, MD;  Location: [facility] ENDOSCOPY;  Service: Cardiovascular;  Laterality: N/A;   TOOTH EXTRACTION N/A 02/03/2023   Procedure: DENTAL RESTORATION/EXTRACTIONS;  Surgeon: [name], DMD;  Location: [facility] OR;  Service: Oral Surgery;  Laterality: N/A;   TRANSCATHETER AORTIC VALVE REPLACEMENT, TRANSFEMORAL Right 02/07/2023   Procedure: Transcatheter Aortic Valve Replacement, Transfemoral;  Surgeon: Orbie Pyo, MD;  Location: [facility] INVASIVE CV LAB;  Service: Open Heart Surgery;  Laterality: Right;   UMBILICAL HERNIA REPAIR       Inpatient Medications: Scheduled Meds:  allopurinol  100 mg Oral Daily   apixaban  5 mg Oral BID   arformoterol  15 mcg Nebulization BID   ARIPiprazole  5 mg Oral QHS   atorvastatin  40 mg  Oral QHS   benzonatate  200 mg Oral TID   budesonide (PULMICORT) nebulizer solution  0.25 mg Nebulization BID   busPIRone  10 mg Oral TID   colchicine  0.6 mg Oral Daily   cyanocobalamin  1,000 mcg Oral Daily   fenofibrate  160 mg Oral Daily   ferrous sulfate  325 mg Oral Daily   fluticasone  2 spray Each Nare Daily   icosapent Ethyl  2 g Oral BID   insulin aspart  0-5 Units Subcutaneous QHS   insulin aspart  0-9 Units Subcutaneous TID WC   lacosamide  150 mg Oral BID   levETIRAcetam  500 mg Oral BID   methylPREDNISolone (SOLU-MEDROL) injection  60 mg Intravenous Q12H   metoprolol succinate  25 mg Oral Daily   mirtazapine  15 mg Oral QHS   oseltamivir  75 mg Oral BID   pantoprazole  40 mg Oral Daily   polyethylene glycol  17 g Oral Daily   pregabalin  150 mg Oral TID    repaglinide  2 mg Oral BID   rOPINIRole  0.25 mg Oral BID   senna-docusate  2 tablet Oral BID   sodium chloride flush  3 mL Intravenous Q12H   torsemide  60 mg Oral BID   traZODone  200 mg Oral QHS   venlafaxine XR  150 mg Oral Q breakfast   Continuous Infusions:  sodium chloride     azithromycin     cefTRIAXone (ROCEPHIN)  IV     PRN Meds: sodium chloride, acetaminophen **OR** acetaminophen, bisacodyl, guaiFENesin-dextromethorphan, HYDROcodone-acetaminophen, ipratropium-albuterol, methocarbamol, sodium chloride flush  Allergies:    Allergies  Allergen Reactions   Niacin And Related Other (See Comments)    Redness, flushing   Ppd [Tuberculin Purified Protein Derivative]     Unknown rxn (MAR)   Tubersol [Tuberculin, Ppd] Other (See Comments)    Unknown rxn (MAR)   Firvanq [Vancomycin] Other (See Comments)    Red man syndrome   Vibramycin [Doxycycline] Rash    Social History:   Social History   Socioeconomic History   Marital status: Significant Other    Spouse name: [name]   Number of children: 1   Years of education: Not on file   Highest education level: Associate degree: academic program  Occupational History   Occupation: retired    Comment: Building control surveyor  Tobacco Use   Smoking status: Former    Current packs/day: 0.00    Average packs/day: 1 pack/day for 44.0 years (44.0 ttl pk-yrs)    Types: Cigarettes    Start date: 03/07/1972    Quit date: 03/07/2016    Years since quitting: 7.6    Passive exposure: Past   Smokeless tobacco: Never  Vaping Use   Vaping status: Never Used  Substance and Sexual Activity   Alcohol use: Yes    Comment: rare   Drug use: No   Sexual activity: Not Currently  Other Topics Concern   Not on file  Social History Narrative   Lives with wife [name]   Right Handed   Drinks 1-2 cups caffeine   Social Drivers of Health   Financial Resource Strain: Medium Risk (12/06/2021)   [facility], [facility]    Overall Financial Resource Strain (CARDIA)    Difficulty of Paying Living Expenses: Somewhat hard  Food Insecurity: No Food Insecurity (10/20/2023)   Hunger Vital Sign    Worried About Running Out of Food in the Last Year: Never true  Ran Out of Food in the Last Year: Never true  Transportation Needs: No Transportation Needs (10/20/2023)   PRAPARE - Administrator, Civil Service (Medical): No    Lack of Transportation (Non-Medical): No  Recent Concern: Transportation Needs - Unmet Transportation Needs (10/03/2023)   PRAPARE - Administrator, Civil Service (Medical): Yes    Lack of Transportation (Non-Medical): No  Physical Activity: Unknown (12/06/2021)   Received from [facility], [facility]   Exercise Vital Sign    Days of Exercise per Week: Patient declined    Minutes of Exercise per Session: 10 min  Stress: No Stress Concern Present (01/21/2022)   Received from [facility], [facility] of Occupational Health - Occupational Stress Questionnaire    Feeling of Stress : Only a little  Recent Concern: Stress - Stress Concern Present (11/19/2021)   Received from [facility] of Occupational Health - Occupational Stress Questionnaire    Feeling of Stress : To some extent  Social Connections: Socially Isolated (10/20/2023)   Social Connection and Isolation Panel [NHANES]    Frequency of Communication with Friends and Family: Once a week    Frequency of Social Gatherings with Friends and Family: Once a week    Attends Religious Services: Never    Database administrator or Organizations: No    Attends Banker Meetings: Never    Marital Status: Divorced  Catering manager Violence: Not At Risk (10/20/2023)   Humiliation, Afraid, Rape, and Kick questionnaire    Fear of Current or Ex-Partner: No    Emotionally Abused: No    Physically Abused: No    Sexually Abused: No    Family History:    Family History   Problem Relation Age of Onset   CAD Maternal Grandfather    Diabetes Other    Diabetes Mellitus II Neg Hx    Colon cancer Neg Hx    Esophageal cancer Neg Hx    Inflammatory bowel disease Neg Hx    Liver disease Neg Hx    Pancreatic cancer Neg Hx    Rectal cancer Neg Hx    Stomach cancer Neg Hx    Sleep apnea Neg Hx      ROS:  Please see the history of present illness.   All other ROS reviewed and negative.     Physical Exam/Data:   Vitals:   10/21/23 0742 10/21/23 0748 10/21/23 0749 10/21/23 0751  BP: (!) 143/81     Pulse: (!) 47     Resp: 20     Temp: 98.3 F (36.8 C)     TempSrc: Oral     SpO2: 100% 93% 94% 94%  Weight:      Height:        Intake/Output Summary (Last 24 hours) at 10/21/2023 0854 Last data filed at 10/21/2023 0742 Gross per 24 hour  Intake 897 ml  Output 3400 ml  Net -2503 ml      10/21/2023    4:46 AM 10/20/2023    5:10 PM 10/10/2023    7:15 PM  Last 3 Weights  Weight (lbs) 346 lb 9 oz 348 lb 5.2 oz 346 lb 2 oz  Weight (kg) 157.2 kg 158 kg 157 kg     Body mass index is 44.5 kg/m.  General: Somnolent, lethargic, on supplemental oxygen HEENT: normal Neck: Difficult to assess JVD Vascular: No carotid bruits; Distal pulses 2+ bilaterally Cardiac:  normal  S1, S2; RRR; 2/6 murmur  Lungs: Mild crackles in lower bases Abd: soft, nontender, no hepatomegaly  Ext: no edema Musculoskeletal:  No deformities, BUE and BLE strength normal and equal Skin: warm and dry  Neuro:  CNs 2-12 intact, no focal abnormalities noted Psych:  Normal affect   EKG:  The EKG was personally reviewed and demonstrates: Difficult to interpret but would likely suspect he is in atrial fibrillation heart rate 100s.  No acute ST-T wave changes.  Frequent PVCs. Telemetry:  Telemetry was personally reviewed and demonstrates: Looks to be more sinus but could be possibly A-fib.  Heart rates in the 80s to 90s very frequent PVCs  Relevant CV Studies: 3-day heart monitor  07/11/2023 Predominant rhythm was normal sinus rhythm with average heart rate 81bpm (ranged from 64 to 96bpm)   Rave PACs and atrial triplets and 1 run of SVT for 4 beats at 169bpm   Frequent PVCs with PVC load 32.2%   Rare ventricular couplets, triplets and bigeminal and trigeminal PVCs.     Patch Wear Time:  3 days and 0 hours (2024-11-22T11:10:22-0500 to 2024-11-25T11:15:29-0500)   Patient had a min HR of 64 bpm, max HR of 169 bpm, and avg HR of 81 bpm. Predominant underlying rhythm was Sinus Rhythm. 1 run of Supraventricular Tachycardia occurred lasting 4 beats with a max rate of 169 bpm (avg 165 bpm). Isolated SVEs were rare  (<1.0%), SVE Triplets were rare (<1.0%), and no SVE Couplets were present. Isolated VEs were frequent (32.2%, 83281), VE Couplets were rare (<1.0%, 983), and VE Triplets were rare (<1.0%, 110). Ventricular Bigeminy and Trigeminy were present.   Echocardiogram 06/26/2023 1. Technically difficult study.   2. Left ventricular ejection fraction, by estimation, is 50 to 55%. The  left ventricle has low normal function. Left ventricular endocardial  border not optimally defined to evaluate regional wall motion. The left  ventricular internal cavity size was  dilated. Left ventricular diastolic function could not be evaluated.   3. Right ventricular systolic function is normal. The right ventricular  size is normal. Mildly increased right ventricular wall thickness.   4. There is no evidence of cardiac tamponade.   5. The mitral valve is grossly normal. No evidence of mitral valve  regurgitation. No evidence of mitral stenosis.   6. The aortic valve was not well visualized. Aortic valve regurgitation  is not visualized. But based on doppler parameter cannot rule out mild  (per max velocity and MG) to moderate aortic stenosis (dimensional index).   7. Aortic ascending aorta is structurally normal, with no evidence of  dilitation.   8. Agitated saline contrast bubble  study was inconclusive.   Conclusion(s)/Recommendation(s): No intracardiac source of embolism  detected on this transthoracic study. Consider a transesophageal  echocardiogram to exclude cardiac source of embolism if clinically  indicated.   Laboratory Data:  High Sensitivity Troponin:   Recent Labs  Lab 09/30/23 1839 09/30/23 2039  TROPONINIHS 11 11     Chemistry Recent Labs  Lab 10/20/23 0605 10/21/23 0333  NA 134* 139  K 3.2* 3.4*  CL 97* 104  CO2 27 25  GLUCOSE 126* 85  BUN 13 11  CREATININE 1.34* 0.99  CALCIUM 8.7* 8.7*  GFRNONAA 58* >60  ANIONGAP 10 10    Recent Labs  Lab 10/20/23 0605  PROT 6.5  ALBUMIN 3.6  AST 26  ALT 28  ALKPHOS 46  BILITOT 0.7   Lipids No results for input(s): "CHOL", "TRIG", "HDL", "LABVLDL", "LDLCALC", "CHOLHDL"  in the last 168 hours.  Hematology Recent Labs  Lab 10/20/23 0605 10/21/23 0333  WBC 9.2 5.5  RBC 4.66 4.21*  HGB 14.1 12.5*  HCT 42.1 37.7*  MCV 90.3 89.5  MCH 30.3 29.7  MCHC 33.5 33.2  RDW 15.2 15.4  PLT 178 179   Thyroid  Recent Labs  Lab 10/20/23 1000  TSH 1.290    BNP Recent Labs  Lab 10/20/23 0605  BNP 235.5*    DDimer No results for input(s): "DDIMER" in the last 168 hours.   Radiology/Studies:  DG Chest Port 1 View Result Date: 10/20/2023 CLINICAL DATA:  Shortness of breath.  History of COPD, CHF and AFib. EXAM: PORTABLE CHEST 1 VIEW COMPARISON:  10/02/2023 FINDINGS: Stable cardiomediastinal contours. New bandlike opacity within the right lower lung is identified which may reflect atelectasis and or airspace disease. No signs of pleural effusion or interstitial edema. Visualized osseous structures appear grossly intact. IMPRESSION: New bandlike opacity within the right lower lung may reflect atelectasis and/or airspace disease. Electronically Signed   By: Signa Kell M.D.   On: 10/20/2023 07:00     Assessment and Plan:   Chronic HFpEF Volume status is difficult to assess however I would  say he looks to be overall euvolemic with some mild congestion in his lungs.  Baseline weight seems to be around 341-346, 346 today.  Much of his presentation is more consistent with the flu. PTA was on: torsemide 60 mg twice daily, spironolactone 12.5 mg, metolazone 2.5 mg once a week every Tuesday. Would continue this regiment now.  There was previous concerns of possible amyloid, PYP scan was equivocal and he has no evidence of M spike proteins.  Had elevated K FLC at 23.  Felt to be unlikely and recommended repeat imaging in 1 to 2 years. If volume status is unclear could consider Reds vest  PAF Frequent PVCs Suspect he is primarily in sinus but with frequent ventricular ectopy difficult to tell.  Previous heart monitor showing burden of 32%.  Continue with Eliquis 5 mg twice daily, Toprol-XL 25 mg  Status post TAVR 03/2023 Last echocardiogram in November 2024, valve was not well-visualized.  Felt to be moderate AS with mean gradient 15.  Seems stable.  Mild to moderate nonobstructive CAD Hyperlipidemia Catheterization June 2024 with no high-grade obstructions.  Preserved cardiac output.  No anginal complaints here, stable Continue with atorvastatin 40 mg, fenofibrate 160, Vascepa  Positive flu/sepsis Recent CVA Morbid obesity Seizure COPD Diabetes Managed per primary team    Risk Assessment/Risk Scores:   New York Heart Association (NYHA) Functional Class NYHA Class III  CHA2DS2-VASc Score = 5  :1} This indicates a 7.2% annual risk of stroke. The patient's score is based upon: CHF History: 1 HTN History: 1 Diabetes History: 1 Stroke History: 0 Vascular Disease History: 1 Age Score: 1 Gender Score: 0 .  He is asking for something to drink.  He is already had like 3 cups of ice water already at 24 and he is fluid restriction and he is he had he just had lunch and he had to drink is okay at the time he if he calls a him know he will tell the nurse because he is on fluid  restriction   For questions or updates, please contact [facility] Please consult [email or web address] for contact info under    Signed, Abagail Kitchens, PA-C  10/21/2023 8:54 AM   History and all data above reviewed.  Patient examined.  I  agree with the findings as above.  He presents for evaluation of progressive shortness of breath.  He is found to be febrile and influenza positive.  He is being managed for sepsis.  We are asked to comment on a possible heart failure component to his complaints.   Chest x-ray does not suggest overt volume overload.  BNP was very mildly elevated.  Intake and output since admission is net negative but likely incomplete.  Initially his home torsemide dose was held but it was restarted today.  He does not recall any of the details of his hospitalization.  He says he just got confused.  He says that he has had a cough productive of sputum but does not recall SOB, PND or orthopnea.  He apparently lives in a nursing home and sounds like he is only able to get up to the toilet with help and spends his time in his bed in his room for the most part.  He does not report weight gain or edema.  He does not report chest pain.    The patient exam reveals COR:RRR  ,  Lungs: Decreased breath sounds with coarse crackles at the chest   ,  Abd: Positive bowel sounds, no rebound no guarding, Ext Mild edema   .  All available labs, radiology testing, previous records reviewed.  Agree with documented assessment and plan:  Acute on chronic diastolic heart failure: I agree with restarting his oral diuretics.  I reviewed extensive office records and  His potassium has been supplemented.  He has not tolerated SGLT2i secondary to hypotension and would not likely tolerate med titration.  It is difficult to assess his volume status but he does not have significant lower extremity edema and I don't appreciate elevated neck veins.  CXR did not demonstrate significant edema and BNP was only  mildly elevated.  At this point I don't think that he needs IV diuresis but we have resumed his previous home diuretic.  Continue treatment for flu/sepsis per primary team.  TAVR:  Normal function previously.  No further imaging is indicated.   Atrial fib:  Currently NSR with ectopy.  Continue DOAC .       Fayrene Fearing Sandhya Denherder  12:46 PM  10/21/2023

## 2023-10-21 NOTE — Progress Notes (Signed)
 PHARMACY - PHYSICIAN COMMUNICATION CRITICAL VALUE ALERT - BLOOD CULTURE IDENTIFICATION (BCID)  Lawrence [name] is an 68 y.o. male who presented to [facility] on 10/20/2023 with a chief complaint of SOB  Assessment:  1/4 Bcx: staph spp. (No gene resistance)  Name of physician (or Provider) Contacted: Dr. Loney Loh  Current antibiotics: Ceftriaxone 2g IV every 24 hours + azithromycin 500mg  IV every 24 hours   Changes to prescribed antibiotics recommended:   -Continue current regimen  Results for orders placed or performed during the hospital encounter of 10/20/23  Blood Culture ID Panel (Reflexed) (Collected: 10/20/2023  6:06 AM)  Result Value Ref Range   Enterococcus faecalis NOT DETECTED NOT DETECTED   Enterococcus Faecium NOT DETECTED NOT DETECTED   Listeria monocytogenes NOT DETECTED NOT DETECTED   Staphylococcus species DETECTED (A) NOT DETECTED   Staphylococcus aureus (BCID) NOT DETECTED NOT DETECTED   Staphylococcus epidermidis NOT DETECTED NOT DETECTED   Staphylococcus lugdunensis NOT DETECTED NOT DETECTED   Streptococcus species NOT DETECTED NOT DETECTED   Streptococcus agalactiae NOT DETECTED NOT DETECTED   Streptococcus pneumoniae NOT DETECTED NOT DETECTED   Streptococcus pyogenes NOT DETECTED NOT DETECTED   A.calcoaceticus-baumannii NOT DETECTED NOT DETECTED   Bacteroides fragilis NOT DETECTED NOT DETECTED   Enterobacterales NOT DETECTED NOT DETECTED   Enterobacter cloacae complex NOT DETECTED NOT DETECTED   Escherichia coli NOT DETECTED NOT DETECTED   Klebsiella aerogenes NOT DETECTED NOT DETECTED   Klebsiella oxytoca NOT DETECTED NOT DETECTED   Klebsiella pneumoniae NOT DETECTED NOT DETECTED   Proteus species NOT DETECTED NOT DETECTED   Salmonella species NOT DETECTED NOT DETECTED   Serratia marcescens NOT DETECTED NOT DETECTED   Haemophilus influenzae NOT DETECTED NOT DETECTED   Neisseria meningitidis NOT DETECTED NOT DETECTED   Pseudomonas aeruginosa NOT DETECTED  NOT DETECTED   Stenotrophomonas maltophilia NOT DETECTED NOT DETECTED   Candida albicans NOT DETECTED NOT DETECTED   Candida auris NOT DETECTED NOT DETECTED   Candida glabrata NOT DETECTED NOT DETECTED   Candida krusei NOT DETECTED NOT DETECTED   Candida parapsilosis NOT DETECTED NOT DETECTED   Candida tropicalis NOT DETECTED NOT DETECTED   Cryptococcus neoformans/gattii NOT DETECTED NOT DETECTED    Arabella Merles, PharmD. Clinical Pharmacist 10/21/2023 6:02 AM

## 2023-10-22 DIAGNOSIS — J189 Pneumonia, unspecified organism: Secondary | ICD-10-CM | POA: Diagnosis not present

## 2023-10-22 DIAGNOSIS — J101 Influenza due to other identified influenza virus with other respiratory manifestations: Secondary | ICD-10-CM | POA: Diagnosis not present

## 2023-10-22 DIAGNOSIS — J9601 Acute respiratory failure with hypoxia: Secondary | ICD-10-CM | POA: Diagnosis not present

## 2023-10-22 LAB — CBC
HCT: 40.1 % (ref 39.0–52.0)
Hemoglobin: 13.3 g/dL (ref 13.0–17.0)
MCH: 29.6 pg (ref 26.0–34.0)
MCHC: 33.2 g/dL (ref 30.0–36.0)
MCV: 89.3 fL (ref 80.0–100.0)
Platelets: 195 10*3/uL (ref 150–400)
RBC: 4.49 MIL/uL (ref 4.22–5.81)
RDW: 15 % (ref 11.5–15.5)
WBC: 3.3 10*3/uL — ABNORMAL LOW (ref 4.0–10.5)
nRBC: 0 % (ref 0.0–0.2)

## 2023-10-22 LAB — RENAL FUNCTION PANEL
Albumin: 3 g/dL — ABNORMAL LOW (ref 3.5–5.0)
Anion gap: 16 — ABNORMAL HIGH (ref 5–15)
BUN: 19 mg/dL (ref 8–23)
CO2: 23 mmol/L (ref 22–32)
Calcium: 8.4 mg/dL — ABNORMAL LOW (ref 8.9–10.3)
Chloride: 98 mmol/L (ref 98–111)
Creatinine, Ser: 1.05 mg/dL (ref 0.61–1.24)
GFR, Estimated: >60 mL/min (ref >60)
Glucose, Bld: 208 mg/dL — ABNORMAL HIGH (ref 70–99)
Phosphorus: 3.3 mg/dL (ref 2.5–4.6)
Potassium: 3.7 mmol/L (ref 3.5–5.1)
Sodium: 137 mmol/L (ref 135–145)

## 2023-10-22 LAB — GLUCOSE, CAPILLARY
Glucose-Capillary: 196 mg/dL — ABNORMAL HIGH (ref 70–99)
Glucose-Capillary: 212 mg/dL — ABNORMAL HIGH (ref 70–99)
Glucose-Capillary: 306 mg/dL — ABNORMAL HIGH (ref 70–99)
Glucose-Capillary: 254 mg/dL — ABNORMAL HIGH (ref 70–99)

## 2023-10-22 MED ORDER — INSULIN GLARGINE 100 UNIT/ML ~~LOC~~ SOLN
7.0000 [IU] | Freq: Every day | SUBCUTANEOUS | Status: DC
  Administered 2023-10-22 – 2023-10-23 (×2): 7 [IU] via SUBCUTANEOUS
  Filled 2023-10-22 (×2): qty 0.07

## 2023-10-22 MED ORDER — METHYLPREDNISOLONE SODIUM SUCC 40 MG IJ SOLR
40.0000 mg | Freq: Two times a day (BID) | INTRAMUSCULAR | Status: DC
  Administered 2023-10-22: 40 mg via INTRAVENOUS
  Filled 2023-10-22: qty 1

## 2023-10-22 MED ORDER — INSULIN ASPART 100 UNIT/ML IJ SOLN
0.0000 [IU] | Freq: Three times a day (TID) | INTRAMUSCULAR | Status: DC
  Administered 2023-10-22: 11 [IU] via SUBCUTANEOUS
  Administered 2023-10-23: 8 [IU] via SUBCUTANEOUS
  Administered 2023-10-23: 5 [IU] via SUBCUTANEOUS
  Administered 2023-10-23: 8 [IU] via SUBCUTANEOUS
  Administered 2023-10-24 (×3): 5 [IU] via SUBCUTANEOUS
  Administered 2023-10-25: 3 [IU] via SUBCUTANEOUS
  Administered 2023-10-25: 8 [IU] via SUBCUTANEOUS

## 2023-10-22 MED ORDER — AZITHROMYCIN 250 MG PO TABS
500.0000 mg | ORAL_TABLET | Freq: Every day | ORAL | Status: AC
  Administered 2023-10-23 – 2023-10-24 (×2): 500 mg via ORAL
  Filled 2023-10-22 (×2): qty 2

## 2023-10-22 NOTE — Progress Notes (Signed)
 This patient is receiving the antibiotic azithromycin by the  intravenous route. Based on criteria approved by the Pharmacy and Therapeutics Committee, and the Infectious Disease Division, the antibiotic(s) is / are being converted to equivalent oral dose form(s).   These criteria include:  - Patient being treated for a respiratory tract infection, urinary tract infection, cellulitis, or Clostridium Difficile Associated Diarrhea  - The patient is not neutropenic and does not exhibit a GI malabsorption state  - The patient is eating (either orally or per tube) and/or has been taking other orally administered medications for at least 24 hours.  - The patient is improving clinically (physician assessment and a 24-hour Tmax of < 100.6F).    If you have questions about this conversion, please contact the pharmacy department.   Thank you.    Romie Minus, PharmD PGY1 Pharmacy Resident  Please check AMION for all [facility] Pharmacy phone numbers After 10:00 PM, call Main Pharmacy [phone] 10/22/23

## 2023-10-22 NOTE — Progress Notes (Signed)
 [facility]                                                                              Lawrence [name], is a 68 y.o. male, DOB - 10/24/1955, ZOX:[id number] Admit date - 10/20/2023    Outpatient Primary MD for the patient is Lawrence Soho, PA-C  LOS - 2  days  Chief Complaint  Patient presents with   Shortness of Breath       Brief summary   Patient is a 68 y.o. male with PMH of COPD, severe aortic stenosis status post TAVR in July 2024, seizure disorder, chronic diastolic CHF (follows Dr. Shirlee Latch), atrial fibrillation on Eliquis, gout, anxiety, BPH, chronic venous stasis, morbid obesity presented from SNF with worsening shortness of breath in the last 2 days.  Patient is somewhat poor historian, reports that he was having subjective fevers and chills, shortness of breath, sleeps on the hospital bed sitting upright, felt congested.  He was sent to the ED and was noted to be hypoxic with O2 sats 88% by the EMS.  Per patient, at baseline he does not use any O2. No nausea vomiting, chest pain or any diarrhea.  In ED, patient was noted to have temp of 102.2 F, RR 24, heart rate 98-103, BP 153/71, O2 sats 96% on 5 L O2 via [place]  Lactic acid 2.0, BNP 235 Respiratory virus panel showed influenza A positive.  RSV, COVID-negative. Chest x-ray showed new bandlike opacity in the right lower lung may reflect atelectasis and/or airspace disease.   Assessment & Plan    Principal Problem:     Sepsis with Acute respiratory failure with hypoxia (HCC)   Influenza A with pneumonia -Met SIRS criteria on admission, presented with fevers, congestion, shortness of breath, new hypoxia on 5 L O2 via [place], not on O2 at baseline, lactic acidosis, AKI.  Chest x-ray with new bandlike opacity in the right lower lung atelectasis versus infiltrates. --Urine strep antigen negative  -BC ID Staph species+, follow blood cultures -Pulmonary hygiene, I-S, flutter valve, DuoNebs, Tessalon Perles -Wean  O2 as tolerated, currently on 3 L O2 via [place] -Continue Tamiflu, IV Rocephin, Zithromax   Active Problems: COPD with acute exacerbation -Noted diffuse wheezing today bilaterally, possibly acute COPD exacerbation with pulmonary edema -Resumed torsemide, placed on scheduled DuoNebs, Pulmicort, Brovana -Improving, taper IV Solu-Medrol to 40 mg every 12 hours -Flutter valve, I-S    Hypokalemia -Replace as needed     Acute kidney injury superimposed on stage 3a chronic kidney disease (HCC) -Baseline creatinine 1.1, creatinine 1.3 on admission, lactic acidosis, received IV fluid bolus in ED -Creatinine stable, continue torsemide, spironolactone, resumed by cardiology   Diabetes mellitus type 2, with diabetic polyneuropathy -Hemoglobin A1c 6.6 on 10/04/2023 -Placed on sliding scale insulin while inpatient CBG (last 3)  Recent Labs    10/21/23 2057 10/22/23 0515 10/22/23 1041  GLUCAP 168* 196* 212*   CBGs elevated likely due to IV steroids -Change to moderate SSI, added Lantus 7 units daily     Pure hypercholesterolemia -Continue statin, fenofibrate     Primary hypertension -BP stable, continue Toprol-XL, spironolactone, torsemide  Chronic diastolic CHF -2D echo 03/10/2023 had shown EF of 55 to 60%, G1 DD, normal right ventricular systolic function, TAVR+ -BNP slightly elevated however no acute pulmonary edema, volume status difficult to assess due to morbid obesity and ongoing pulmonary issues -Cardiology following resumed torsemide, Aldactone -Negative balance of 8.7 L, weight 348.33lbs on admission, 339.5 today     Seizure disorder (HCC) -Continue Keppra, lacosamide       S/P TAVR (transcatheter aortic valve replacement) -No acute issues   Prior history of CVA (cerebral vascular accident) (HCC) -Continue statin, eliquis     Atrial fibrillation, chronic (HCC) -Heart rate controlled, resumed Toprol-XL -Continue eliquis     Obesity, Class III, BMI 40-49.9 (morbid  obesity) (HCC) -Currently on Mounjaro outpatient   Anxiety, depression -Patient is on several psych medications, will resume -Continue trazodone, mirtazapine, BuSpar, Effexor, Abilify   Obesity class III Estimated body mass index is 43.59 kg/m as calculated from the following:   Height as of this encounter: 6\' 2"  (1.88 m).   Weight as of this encounter: 154 kg.  Code Status: DNR DVT Prophylaxis:   apixaban (ELIQUIS) tablet 5 mg   Level of Care: Level of care: Progressive Family Communication: Updated patient Disposition Plan:      Remains inpatient appropriate:      Procedures:    Consultants:   Cardiology  Antimicrobials:   Anti-infectives (From admission, onward)    Start     Dose/Rate Route Frequency Ordered Stop   10/21/23 1000  azithromycin (ZITHROMAX) 500 mg in sodium chloride 0.9 % 250 mL IVPB        500 mg 250 mL/hr over 60 Minutes Intravenous Every 24 hours 10/20/23 1044 10/25/23 0959   10/21/23 1000  cefTRIAXone (ROCEPHIN) 2 g in sodium chloride 0.9 % 100 mL IVPB        2 g 200 mL/hr over 30 Minutes Intravenous Every 24 hours 10/20/23 1045 10/25/23 0959   10/20/23 1000  cefTRIAXone (ROCEPHIN) 2 g in sodium chloride 0.9 % 100 mL IVPB  Status:  Discontinued        2 g 200 mL/hr over 30 Minutes Intravenous Every 24 hours 10/20/23 0954 10/20/23 1045   10/20/23 1000  azithromycin (ZITHROMAX) 500 mg in sodium chloride 0.9 % 250 mL IVPB  Status:  Discontinued        500 mg 250 mL/hr over 60 Minutes Intravenous Every 24 hours 10/20/23 0954 10/20/23 1043   10/20/23 0830  oseltamivir (TAMIFLU) capsule 75 mg        75 mg Oral 2 times daily 10/20/23 0820 10/25/23 0959   10/20/23 0645  cefTRIAXone (ROCEPHIN) 1 g in sodium chloride 0.9 % 100 mL IVPB        1 g 200 mL/hr over 30 Minutes Intravenous  Once 10/20/23 0637 10/20/23 0711   10/20/23 0645  azithromycin (ZITHROMAX) 500 mg in sodium chloride 0.9 % 250 mL IVPB        500 mg 250 mL/hr over 60 Minutes Intravenous   Once 10/20/23 0637 10/20/23 0829          Medications  allopurinol  100 mg Oral Daily   apixaban  5 mg Oral BID   arformoterol  15 mcg Nebulization BID   ARIPiprazole  5 mg Oral QHS   atorvastatin  40 mg Oral QHS   benzonatate  200 mg Oral TID   budesonide (PULMICORT) nebulizer solution  0.25 mg Nebulization BID   busPIRone  10 mg Oral TID   colchicine  0.6 mg Oral Daily   cyanocobalamin  1,000 mcg Oral Daily   fenofibrate  160 mg Oral Daily   ferrous sulfate  325 mg Oral Daily   fluticasone  2 spray Each Nare Daily   icosapent Ethyl  2 g Oral BID   insulin aspart  0-5 Units Subcutaneous QHS   insulin aspart  0-9 Units Subcutaneous TID WC   insulin glargine  7 Units Subcutaneous Daily   lacosamide  150 mg Oral BID   levETIRAcetam  500 mg Oral BID   methylPREDNISolone (SOLU-MEDROL) injection  60 mg Intravenous Q12H   metoprolol succinate  25 mg Oral Daily   mirtazapine  15 mg Oral QHS   oseltamivir  75 mg Oral BID   pantoprazole  40 mg Oral Daily   polyethylene glycol  17 g Oral Daily   pregabalin  150 mg Oral TID   repaglinide  2 mg Oral BID   rOPINIRole  0.25 mg Oral BID   senna-docusate  2 tablet Oral BID   sodium chloride flush  3 mL Intravenous Q12H   spironolactone  12.5 mg Oral Daily   torsemide  60 mg Oral BID   traZODone  200 mg Oral QHS   venlafaxine XR  150 mg Oral Q breakfast      Subjective:   Philander Ake was seen and examined today.  Feeling better today, still on O2 3 L O2 via [place].  Wheezing improving.  Fevers or chills, chest pain, acute shortness of breath.   Objective:   Vitals:   10/22/23 0511 10/22/23 0732 10/22/23 0757 10/22/23 1036  BP: 120/74  (!) 126/53 122/85  Pulse: 73  77 68  Resp: 17  20 20   Temp: (!) 97.5 F (36.4 C)  97.6 F (36.4 C) (!) 97.5 F (36.4 C)  TempSrc: Oral  Oral Oral  SpO2: 95% 95% 93% 93%  Weight: (!) 154 kg     Height:        Intake/Output Summary (Last 24 hours) at 10/22/2023 1120 Last data filed at  10/22/2023 0800 Gross per 24 hour  Intake 627 ml  Output 5750 ml  Net -5123 ml     Wt Readings from Last 3 Encounters:  10/22/23 (!) 154 kg  10/10/23 (!) 157 kg  10/07/23 (!) 156.6 kg    Physical Exam General: Alert and oriented x 3, NAD Cardiovascular: S1 S2 clear, RRR.  Respiratory: Bilateral expiratory wheezing, improving Gastrointestinal: Soft, nontender, nondistended, NBS, obese Ext: trace pedal edema bilaterally Neuro: no new deficits Psych: Normal affect    Data Reviewed:  I have personally reviewed following labs    CBC Lab Results  Component Value Date   WBC 3.3 (L) 10/22/2023   RBC 4.49 10/22/2023   HGB 13.3 10/22/2023   HCT 40.1 10/22/2023   MCV 89.3 10/22/2023   MCH 29.6 10/22/2023   PLT 195 10/22/2023   MCHC 33.2 10/22/2023   RDW 15.0 10/22/2023   LYMPHSABS 1.7 10/03/2023   MONOABS 0.8 10/03/2023   EOSABS 0.4 10/03/2023   BASOSABS 0.0 10/03/2023     Last metabolic panel Lab Results  Component Value Date   NA 137 10/22/2023   K 3.7 10/22/2023   CL 98 10/22/2023   CO2 23 10/22/2023   BUN 19 10/22/2023   CREATININE 1.05 10/22/2023   GLUCOSE 208 (H) 10/22/2023   GFRNONAA >60 10/22/2023   GFRAA 77 05/20/2020   CALCIUM 8.4 (L) 10/22/2023   PHOS 3.3 10/22/2023   PROT 6.5 10/20/2023  ALBUMIN 3.0 (L) 10/22/2023   LABGLOB 2.8 02/08/2023   BILITOT 0.7 10/20/2023   ALKPHOS 46 10/20/2023   AST 26 10/20/2023   ALT 28 10/20/2023   ANIONGAP 16 (H) 10/22/2023    CBG (last 3)  Recent Labs    10/21/23 2057 10/22/23 0515 10/22/23 1041  GLUCAP 168* 196* 212*      Coagulation Profile: No results for input(s): "INR", "PROTIME" in the last 168 hours.   Radiology Studies: I have personally reviewed the imaging studies  No results found.      Thad Ranger M.D. [facility] 10/22/2023, 11:20 AM  Available via Epic secure chat 7am-7pm After 7 pm, please refer to night coverage provider listed on amion.

## 2023-10-22 NOTE — Progress Notes (Signed)
    Chart reviewed.  No new suggestions.  We will follow as needed during this hospitalization.

## 2023-10-23 DIAGNOSIS — J189 Pneumonia, unspecified organism: Secondary | ICD-10-CM | POA: Diagnosis not present

## 2023-10-23 DIAGNOSIS — J9601 Acute respiratory failure with hypoxia: Secondary | ICD-10-CM | POA: Diagnosis not present

## 2023-10-23 DIAGNOSIS — J101 Influenza due to other identified influenza virus with other respiratory manifestations: Secondary | ICD-10-CM | POA: Diagnosis not present

## 2023-10-23 LAB — CBC
HCT: 40.8 % (ref 39.0–52.0)
Hemoglobin: 13.8 g/dL (ref 13.0–17.0)
MCH: 30.2 pg (ref 26.0–34.0)
MCHC: 33.8 g/dL (ref 30.0–36.0)
MCV: 89.3 fL (ref 80.0–100.0)
Platelets: 212 10*3/uL (ref 150–400)
RBC: 4.57 MIL/uL (ref 4.22–5.81)
RDW: 14.8 % (ref 11.5–15.5)
WBC: 5.3 10*3/uL (ref 4.0–10.5)
nRBC: 0 % (ref 0.0–0.2)

## 2023-10-23 LAB — RENAL FUNCTION PANEL
Albumin: 3.1 g/dL — ABNORMAL LOW (ref 3.5–5.0)
Anion gap: 14 (ref 5–15)
BUN: 28 mg/dL — ABNORMAL HIGH (ref 8–23)
CO2: 28 mmol/L (ref 22–32)
Calcium: 8.6 mg/dL — ABNORMAL LOW (ref 8.9–10.3)
Chloride: 98 mmol/L (ref 98–111)
Creatinine, Ser: 1.27 mg/dL — ABNORMAL HIGH (ref 0.61–1.24)
GFR, Estimated: >60 mL/min (ref >60)
Glucose, Bld: 278 mg/dL — ABNORMAL HIGH (ref 70–99)
Phosphorus: 3.1 mg/dL (ref 2.5–4.6)
Potassium: 3.6 mmol/L (ref 3.5–5.1)
Sodium: 140 mmol/L (ref 135–145)

## 2023-10-23 LAB — GLUCOSE, CAPILLARY
Glucose-Capillary: 258 mg/dL — ABNORMAL HIGH (ref 70–99)
Glucose-Capillary: 235 mg/dL — ABNORMAL HIGH (ref 70–99)
Glucose-Capillary: 261 mg/dL — ABNORMAL HIGH (ref 70–99)
Glucose-Capillary: 225 mg/dL — ABNORMAL HIGH (ref 70–99)
Glucose-Capillary: 297 mg/dL — ABNORMAL HIGH (ref 70–99)
Glucose-Capillary: 226 mg/dL — ABNORMAL HIGH (ref 70–99)

## 2023-10-23 LAB — CULTURE, BLOOD (ROUTINE X 2): Special Requests: ADEQUATE

## 2023-10-23 MED ORDER — PRAZOSIN HCL 2 MG PO CAPS
2.0000 mg | ORAL_CAPSULE | Freq: Every day | ORAL | Status: DC
  Administered 2023-10-23 – 2023-10-24 (×2): 2 mg via ORAL
  Filled 2023-10-23 (×3): qty 1

## 2023-10-23 MED ORDER — PREDNISONE 20 MG PO TABS
40.0000 mg | ORAL_TABLET | Freq: Every day | ORAL | Status: AC
  Administered 2023-10-23 – 2023-10-25 (×3): 40 mg via ORAL
  Filled 2023-10-23 (×3): qty 2

## 2023-10-23 MED ORDER — INSULIN GLARGINE 100 UNIT/ML ~~LOC~~ SOLN
10.0000 [IU] | Freq: Every day | SUBCUTANEOUS | Status: DC
  Administered 2023-10-24: 10 [IU] via SUBCUTANEOUS
  Filled 2023-10-23: qty 0.1

## 2023-10-23 MED ORDER — INSULIN ASPART 100 UNIT/ML IJ SOLN
5.0000 [IU] | Freq: Three times a day (TID) | INTRAMUSCULAR | Status: DC
  Administered 2023-10-23 – 2023-10-25 (×6): 5 [IU] via SUBCUTANEOUS

## 2023-10-23 MED ORDER — INSULIN GLARGINE 100 UNIT/ML ~~LOC~~ SOLN
3.0000 [IU] | Freq: Once | SUBCUTANEOUS | Status: AC
  Administered 2023-10-23: 3 [IU] via SUBCUTANEOUS
  Filled 2023-10-23: qty 0.03

## 2023-10-23 NOTE — Plan of Care (Signed)
  Problem: Education: Goal: Ability to describe self-care measures that may prevent or decrease complications (Diabetes Survival Skills Education) will improve Outcome: Progressing   Problem: Coping: Goal: Ability to adjust to condition or change in health will improve Outcome: Progressing   Problem: Fluid Volume: Goal: Ability to maintain a balanced intake and output will improve Outcome: Progressing   Problem: Health Behavior/Discharge Planning: Goal: Ability to identify and utilize available resources and services will improve Outcome: Progressing   Problem: Nutritional: Goal: Maintenance of adequate nutrition will improve Outcome: Progressing   Problem: Education: Goal: Knowledge of General Education information will improve Description: Including pain rating scale, medication(s)/side effects and non-pharmacologic comfort measures Outcome: Progressing   Problem: Metabolic: Goal: Ability to maintain appropriate glucose levels will improve Outcome: Not Progressing   Problem: Nutritional: Goal: Progress toward achieving an optimal weight will improve Outcome: Not Progressing   Problem: Health Behavior/Discharge Planning: Goal: Ability to manage health-related needs will improve Outcome: Not Progressing   Problem: Clinical Measurements: Goal: Respiratory complications will improve Outcome: Not Progressing

## 2023-10-23 NOTE — Inpatient Diabetes Management (Signed)
 Inpatient Diabetes Program Recommendations  AACE/ADA: New Consensus Statement on Inpatient Glycemic Control (2015)  Target Ranges:  Prepandial:   less than 140 mg/dL      Peak postprandial:   less than 180 mg/dL (1-2 hours)      Critically ill patients:  140 - 180 mg/dL    Latest Reference Range & Units 10/04/23 03:37  Hemoglobin A1C 4.8 - 5.6 % 6.6 (H)  (H): Data is abnormally high  Latest Reference Range & Units 10/22/23 05:15 10/22/23 10:41 10/22/23 16:39 10/22/23 21:01  Glucose-Capillary 70 - 99 mg/dL 213 (H)  2 units Novolog 212 (H)    7 units Lantus @1101  306 (H)  11 units Novolog  254 (H)  3 units Novolog   (H): Data is abnormally high  Latest Reference Range & Units 10/23/23 05:07  Glucose-Capillary 70 - 99 mg/dL 086 (H)  8 units Novolog   (H): Data is abnormally high     Admit with: Sepsis with Acute respiratory failure with hypoxia/ Influenza A with pneumonia  History: DM  Home DM Meds: Mounjaro 15 mg Qweek       Prandin 2 mg BID  Current Orders: Novolog Moderate Correction Scale/ SSI (0-15 units) TID AC + HS     Lantus 7 units daily     Prandin 2 mg BID     MD- Note Solumedrol reduced to 40 mg BID  CBGs >200--Eating 100% meals  Please consider while getting Steroids:  1. Increase Lantus to 10 units Daily  2. Start Novolog Meal Coverage: Novolog 6 units TID with meals HOLD if pt NPO HOLD if pt eats <50% meals     --Will follow patient during hospitalization--  Ambrose Finland RN, MSN, CDCES Diabetes Coordinator Inpatient Glycemic Control Team Team Pager: [phone] (8a-5p)

## 2023-10-23 NOTE — Plan of Care (Signed)
   Problem: Coping: Goal: Ability to adjust to condition or change in health will improve Outcome: Progressing   Problem: Fluid Volume: Goal: Ability to maintain a balanced intake and output will improve Outcome: Progressing

## 2023-10-23 NOTE — Progress Notes (Signed)
 [facility]                                                                              Lawrence [name], is a 68 y.o. male, DOB - 1955-10-05, ZOX:[id number] Admit date - 10/20/2023    Outpatient Primary MD for the patient is [name], PA-C  LOS - 3  days  Chief Complaint  Patient presents with   Shortness of Breath       Brief summary   Patient is a 68 y.o. male with PMH of COPD, severe aortic stenosis status post TAVR in July 2024, seizure disorder, chronic diastolic CHF (follows Dr. Shirlee Latch), atrial fibrillation on Eliquis, gout, anxiety, BPH, chronic venous stasis, morbid obesity presented from SNF with worsening shortness of breath in the last 2 days.  Patient is somewhat poor historian, reports that he was having subjective fevers and chills, shortness of breath, sleeps on the hospital bed sitting upright, felt congested.  He was sent to the ED and was noted to be hypoxic with O2 sats 88% by the EMS.  Per patient, at baseline he does not use any O2. No nausea vomiting, chest pain or any diarrhea.  In ED, patient was noted to have temp of 102.2 F, RR 24, heart rate 98-103, BP 153/71, O2 sats 96% on 5 L O2 via [place]  Lactic acid 2.0, BNP 235 Respiratory virus panel showed influenza A positive.  RSV, COVID-negative. Chest x-ray showed new bandlike opacity in the right lower lung may reflect atelectasis and/or airspace disease.   Assessment & Plan    Principal Problem:     Sepsis with Acute respiratory failure with hypoxia (HCC)   Influenza A with pneumonia -Met SIRS criteria on admission, presented with fevers, congestion, shortness of breath, new hypoxia on 5 L O2 via [place], not on O2 at baseline, lactic acidosis, AKI.  Chest x-ray with new bandlike opacity in the right lower lung atelectasis versus infiltrates. --Urine strep antigen negative  -BC ID Staph species+, blood cultures showed 1 out of 2 Staph warneri, likely contaminant -Pulmonary hygiene, I-S,  flutter valve, DuoNebs, Tessalon Perles -Wean O2, currently O2 sats 93 to 94% on 2 L -Continue Tamiflu, day #4 today, IV Rocephin, Zithromax, will transition to oral antibiotics in a.m.   Active Problems: COPD with acute exacerbation -Noted diffuse wheezing today bilaterally, possibly acute COPD exacerbation with pulmonary edema -Resumed torsemide, placed on scheduled DuoNebs, Pulmicort, Brovana -Continue flutter valve, I-S -Discontinued IV Solu-Medrol, causing significant hyperglycemia.  Wheezing is improving, transition to oral prednisone 40 mg daily    Hypokalemia -Replace as needed     Acute kidney injury superimposed on stage 3a chronic kidney disease (HCC) -Baseline creatinine 1.1, creatinine 1.3 on admission, lactic acidosis, received IV fluid bolus in ED -Creatinine stable, continue torsemide, spironolactone, resumed by cardiology   Diabetes mellitus type 2, with diabetic polyneuropathy -Hemoglobin A1c 6.6 on 10/04/2023 -Placed on sliding scale insulin while inpatient CBG (last 3)  Recent Labs    10/23/23 0507 10/23/23 1053 10/23/23 1115  GLUCAP 258* 235* 261*   CBGs elevated likely due to IV steroids -Discontinued IV steroids,  Increased Lantus to  10 units daily, added NovoLog meal coverage 5 units 3 times daily AC, continue SSI Hopefully CBGs will improve after discontinuation of IV steroids     Pure hypercholesterolemia -Continue statin, fenofibrate     Primary hypertension -BP stable, continue Toprol-XL, spironolactone, torsemide    Chronic diastolic CHF -2D echo 03/10/2023 had shown EF of 55 to 60%, G1 DD, normal right ventricular systolic function, TAVR+ -BNP slightly elevated however no acute pulmonary edema, volume status difficult to assess due to morbid obesity and ongoing pulmonary issues -Cardiology following resumed torsemide, Aldactone -Negative balance of 10.8 L , weight 348.33lbs on admission, 338 today     Seizure disorder (HCC) -Continue Keppra,  lacosamide       S/P TAVR (transcatheter aortic valve replacement) -No acute issues   Prior history of CVA (cerebral vascular accident) (HCC) -Continue statin, eliquis     Atrial fibrillation, chronic (HCC) -Heart rate controlled, resumed Toprol-XL -Continue eliquis     Obesity, Class III, BMI 40-49.9 (morbid obesity) (HCC) -Currently on Mounjaro outpatient   Anxiety, depression -Patient is on several psych medications, will resume -Continue trazodone, mirtazapine, BuSpar, Effexor, Abilify   Obesity class III Estimated body mass index is 43.45 kg/m as calculated from the following:   Height as of this encounter: 6\' 2"  (1.88 m).   Weight as of this encounter: 153.5 kg.  Code Status: DNR DVT Prophylaxis:   apixaban (ELIQUIS) tablet 5 mg   Level of Care: Level of care: Progressive Family Communication: Updated patient Disposition Plan:      Remains inpatient appropriate:   Hopefully DC back to SNF in a.m.   Procedures:    Consultants:   Cardiology  Antimicrobials:   Anti-infectives (From admission, onward)    Start     Dose/Rate Route Frequency Ordered Stop   10/23/23 1000  azithromycin (ZITHROMAX) tablet 500 mg        500 mg Oral Daily 10/22/23 1225 10/25/23 0959   10/21/23 1000  azithromycin (ZITHROMAX) 500 mg in sodium chloride 0.9 % 250 mL IVPB  Status:  Discontinued        500 mg 250 mL/hr over 60 Minutes Intravenous Every 24 hours 10/20/23 1044 10/22/23 1225   10/21/23 1000  cefTRIAXone (ROCEPHIN) 2 g in sodium chloride 0.9 % 100 mL IVPB        2 g 200 mL/hr over 30 Minutes Intravenous Every 24 hours 10/20/23 1045 10/25/23 0959   10/20/23 1000  cefTRIAXone (ROCEPHIN) 2 g in sodium chloride 0.9 % 100 mL IVPB  Status:  Discontinued        2 g 200 mL/hr over 30 Minutes Intravenous Every 24 hours 10/20/23 0954 10/20/23 1045   10/20/23 1000  azithromycin (ZITHROMAX) 500 mg in sodium chloride 0.9 % 250 mL IVPB  Status:  Discontinued        500 mg 250 mL/hr over  60 Minutes Intravenous Every 24 hours 10/20/23 0954 10/20/23 1043   10/20/23 0830  oseltamivir (TAMIFLU) capsule 75 mg        75 mg Oral 2 times daily 10/20/23 0820 10/25/23 0959   10/20/23 0645  cefTRIAXone (ROCEPHIN) 1 g in sodium chloride 0.9 % 100 mL IVPB        1 g 200 mL/hr over 30 Minutes Intravenous  Once 10/20/23 0637 10/20/23 0711   10/20/23 0645  azithromycin (ZITHROMAX) 500 mg in sodium chloride 0.9 % 250 mL IVPB        500 mg 250 mL/hr over 60 Minutes Intravenous  Once 10/20/23 0637 10/20/23 0829          Medications  allopurinol  100 mg Oral Daily   apixaban  5 mg Oral BID   arformoterol  15 mcg Nebulization BID   ARIPiprazole  5 mg Oral QHS   atorvastatin  40 mg Oral QHS   azithromycin  500 mg Oral Daily   benzonatate  200 mg Oral TID   budesonide (PULMICORT) nebulizer solution  0.25 mg Nebulization BID   busPIRone  10 mg Oral TID   colchicine  0.6 mg Oral Daily   cyanocobalamin  1,000 mcg Oral Daily   fenofibrate  160 mg Oral Daily   ferrous sulfate  325 mg Oral Daily   fluticasone  2 spray Each Nare Daily   icosapent Ethyl  2 g Oral BID   insulin aspart  0-15 Units Subcutaneous TID WC   insulin aspart  0-5 Units Subcutaneous QHS   insulin aspart  5 Units Subcutaneous TID WC   [START ON 10/24/2023] insulin glargine  10 Units Subcutaneous Daily   insulin glargine  3 Units Subcutaneous Once   lacosamide  150 mg Oral BID   levETIRAcetam  500 mg Oral BID   metoprolol succinate  25 mg Oral Daily   mirtazapine  15 mg Oral QHS   oseltamivir  75 mg Oral BID   pantoprazole  40 mg Oral Daily   polyethylene glycol  17 g Oral Daily   predniSONE  40 mg Oral QAC breakfast   pregabalin  150 mg Oral TID   repaglinide  2 mg Oral BID   rOPINIRole  0.25 mg Oral BID   senna-docusate  2 tablet Oral BID   sodium chloride flush  3 mL Intravenous Q12H   spironolactone  12.5 mg Oral Daily   torsemide  60 mg Oral BID   traZODone  200 mg Oral QHS   venlafaxine XR  150 mg Oral  Q breakfast      Subjective:   Joshus Rogan was seen and examined today.  States had a nightmare last night hence not feeling too good this morning due to PTSD.  Weaned O2 from 3 L to 2 L, O2 sats remained stable 94%.  Wheezing improving, no chest pain, acute shortness of breath, fevers or chills.   Objective:   Vitals:   10/23/23 0500 10/23/23 0740 10/23/23 0822 10/23/23 1055  BP:  (!) 144/86  121/71  Pulse:  65  67  Resp:  17  18  Temp:  97.7 F (36.5 C)  97.6 F (36.4 C)  TempSrc:  Oral  Oral  SpO2:  94% 94% 93%  Weight: (!) 153.5 kg     Height:        Intake/Output Summary (Last 24 hours) at 10/23/2023 1432 Last data filed at 10/23/2023 1224 Gross per 24 hour  Intake 2273 ml  Output 3975 ml  Net -1702 ml     Wt Readings from Last 3 Encounters:  10/23/23 (!) 153.5 kg  10/10/23 (!) 157 kg  10/07/23 (!) 156.6 kg   Physical Exam General: Alert and oriented x 3, NAD Cardiovascular: S1 S2 clear, RRR.  Respiratory: Diminished breath sound at the bases with scattered rhonchi Gastrointestinal: Soft, nontender, nondistended, NBS Ext: trace pedal edema bilaterally Neuro: no new deficits Psych: Normal affect    Data Reviewed:  I have personally reviewed following labs    CBC Lab Results  Component Value Date   WBC 5.3 10/23/2023   RBC 4.57 10/23/2023  HGB 13.8 10/23/2023   HCT 40.8 10/23/2023   MCV 89.3 10/23/2023   MCH 30.2 10/23/2023   PLT 212 10/23/2023   MCHC 33.8 10/23/2023   RDW 14.8 10/23/2023   LYMPHSABS 1.7 10/03/2023   MONOABS 0.8 10/03/2023   EOSABS 0.4 10/03/2023   BASOSABS 0.0 10/03/2023     Last metabolic panel Lab Results  Component Value Date   NA 140 10/23/2023   K 3.6 10/23/2023   CL 98 10/23/2023   CO2 28 10/23/2023   BUN 28 (H) 10/23/2023   CREATININE 1.27 (H) 10/23/2023   GLUCOSE 278 (H) 10/23/2023   GFRNONAA >60 10/23/2023   GFRAA 77 05/20/2020   CALCIUM 8.6 (L) 10/23/2023   PHOS 3.1 10/23/2023   PROT 6.5 10/20/2023    ALBUMIN 3.1 (L) 10/23/2023   LABGLOB 2.8 02/08/2023   BILITOT 0.7 10/20/2023   ALKPHOS 46 10/20/2023   AST 26 10/20/2023   ALT 28 10/20/2023   ANIONGAP 14 10/23/2023    CBG (last 3)  Recent Labs    10/23/23 0507 10/23/23 1053 10/23/23 1115  GLUCAP 258* 235* 261*      Coagulation Profile: No results for input(s): "INR", "PROTIME" in the last 168 hours.   Radiology Studies: I have personally reviewed the imaging studies  No results found.      Thad Ranger M.D. [facility] 10/23/2023, 2:32 PM  Available via Epic secure chat 7am-7pm After 7 pm, please refer to night coverage provider listed on amion.

## 2023-10-23 NOTE — TOC Initial Note (Addendum)
 Transition of Care [facility]) - Initial/Assessment Note    Patient Details  Name: Lawrence [name] MRN: [id number] Date of Birth: [birth year]  [facility]) CM/SW Contact:    Michaela Corner, LCSWA Phone Number: 10/23/2023, 10:39 AM  Clinical Narrative:  CSW met patient at bedside and introduced self/role. Patient stated he came from [place] [facility] and gave CSW permission to contact [facility]. CSW asked patient permission to contact family. Patient stated to only contact [name] at [phone].   11:00 AM Per Wille Celeste, acceptance back to [facility] is contingent on bed availability after today.   TOC will continue to follow.       Expected Discharge Plan: Skilled Nursing Facility Barriers to Discharge: Continued Medical Work up, English as a second language teacher   Patient Goals and CMS Choice Patient states their goals for this hospitalization and ongoing recovery are:: To get better          Expected Discharge Plan and Services In-house Referral: Clinical Social Work     Living arrangements for the past 2 months: Skilled Holiday representative                   DME Agency: NA       HH Arranged: Charity fundraiser, Nurse's Aide HH Agency: [facility] Date HH Agency Contacted: 10/04/23 Time HH Agency Contacted: 1100 Representative spoke with at [facility] Agency: Adele Dan  Prior Living Arrangements/Services Living arrangements for the past 2 months: Skilled Nursing Facility Lives with:: Facility Resident Patient language and need for interpreter reviewed:: No Do you feel safe going back to the place where you live?: Yes      Need for Family Participation in Patient Care: Yes (Comment)   Current home services: DME (CPAP< rollator, bedside commode and electric scooter.) Criminal Activity/Legal Involvement Pertinent to Current Situation/Hospitalization: No - Comment as needed  Activities of Daily Living   ADL Screening (condition at time of admission) Independently performs ADLs?: No Does  the patient have a NEW difficulty with bathing/dressing/toileting/self-feeding that is expected to last >3 days?: No Does the patient have a NEW difficulty with getting in/out of bed, walking, or climbing stairs that is expected to last >3 days?: No Does the patient have a NEW difficulty with communication that is expected to last >3 days?: No Is the patient deaf or have difficulty hearing?: No Does the patient have difficulty seeing, even when wearing glasses/contacts?: No Does the patient have difficulty concentrating, remembering, or making decisions?: Yes  Permission Sought/Granted Permission sought to share information with : Family Supports Permission granted to share information with : Yes, Verbal Permission Granted  Share Information with NAME: Janett Ayott  Permission granted to share info w AGENCY: SNFs  Permission granted to share info w Relationship: sig/other  Permission granted to share info w Contact Information: [phone]  Emotional Assessment Appearance:: Appears stated age Attitude/Demeanor/Rapport: Engaged Affect (typically observed): Pleasant, Calm Orientation: : Oriented to Self, Oriented to Place, Oriented to  Time, Oriented to Situation Alcohol / Substance Use: Not Applicable Psych Involvement: No (comment)  Admission diagnosis:  Influenza A [J10.1] Acute respiratory failure with hypoxia (HCC) [J96.01] Community acquired pneumonia of right lower lobe of lung [J18.9] Patient Active Problem List   Diagnosis Date Noted   Acute respiratory failure with hypoxia (HCC) 10/20/2023   Influenza A with pneumonia 10/20/2023   Altered mental status 10/05/2023   MDD (major depressive disorder), recurrent severe, without psychosis (HCC) 10/05/2023   Hypoglycemia 10/03/2023   SIRS (systemic inflammatory response syndrome) (HCC) 10/03/2023  History of COPD 10/03/2023   History of seizures 10/03/2023   Multiple falls 07/21/2023   Fall 07/15/2023   Acute metabolic  encephalopathy 07/14/2023   Obesity, Class III, BMI 40-49.9 (morbid obesity) (HCC) 07/14/2023   History of CVA with residual deficit 07/14/2023   Fall at home, initial encounter 07/14/2023   Gout 07/12/2023   Constipation 07/09/2023   Polyneuropathy 07/04/2023   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   Acute kidney injury superimposed on stage 3a chronic kidney disease (HCC) 07/03/2023   Orthostatic hypotension 07/03/2023   Stage 3a chronic kidney disease (HCC) 06/30/2023   Acute stroke due to occlusion of left cerebellar artery (HCC) 06/28/2023   Type 2 diabetes mellitus with diabetic polyneuropathy, with long-term current use of insulin (HCC) 06/28/2023   CVA (cerebral vascular accident) (HCC) 06/25/2023   Atrial fibrillation, chronic (HCC) 06/25/2023   Acute CVA (cerebrovascular accident) (HCC) 06/25/2023   S/P TAVR (transcatheter aortic valve replacement) 02/07/2023   Acute respiratory failure with hypoxia and hypercapnia (HCC) 01/23/2023   Nail, injury by, initial encounter 12/09/2022   Heart failure (HCC) 10/27/2022   Gout attack 10/27/2022   CHF (congestive heart failure) (HCC) 10/26/2022   Right sided weakness 05/26/2022   Dysarthria 05/25/2022   Ischemic stroke (HCC) 02/25/2022   Hemiparesis affecting left side as late effect of cerebrovascular accident (CVA) (HCC) 02/25/2022   Durable power of attorney for healthcare exists  02/25/2022   Depression 08/07/2021   Paronychia of great toe, left 07/07/2021   Stroke-like symptoms 04/27/2021   Ambulatory dysfunction 04/27/2021   Left-sided weakness 02/09/2021   Atrial fibrillation with rapid ventricular response (HCC) 02/08/2021   AF (paroxysmal atrial fibrillation) (HCC) 01/25/2021   COPD (chronic obstructive pulmonary disease) (HCC) 01/25/2021   Aortic stenosis 01/21/2021   Acquired dilation of ascending aorta and aortic root (HCC) 01/21/2021   Coronary artery disease due to lipid rich plaque    SVT  (supraventricular tachycardia) (HCC)    Seizure disorder (HCC)    Primary hypertension 10/17/2020   Hypokalemia 10/17/2020   Cellulitis of right leg 12/04/2019   Pain due to onychomycosis of toenails of both feet 09/11/2019   Diabetic neuropathy (HCC) 09/11/2019   DM2 (diabetes mellitus, type 2) (HCC) 09/11/2019   Sinus tachycardia 03/07/2019   Chronic venous stasis 03/07/2019   History of anemia 12/26/2018   Anemia 08/24/2018   History of colon polyps 08/24/2018   Do not resuscitate status 08/24/2018   Chronic diastolic CHF (congestive heart failure) (HCC) 08/18/2018   Coronary artery calcification seen on CAT scan    Pure hypercholesterolemia    Chest pain    Shortness of breath    Right foot pain 01/13/2018   Acute on chronic diastolic CHF (congestive heart failure) (HCC) 01/13/2018   Type 2 diabetes mellitus with obesity (HCC) 01/13/2018   Lower extremity cellulitis 11/07/2017   Primary osteoarthritis, left shoulder 03/05/2017   BPH without obstruction/lower urinary tract symptoms 02/22/2017   Hematuria 02/22/2017   Peripheral neuropathy 02/22/2017   Physical deconditioning 02/22/2017   Class 3 obesity    PTSD (post-traumatic stress disorder)    Anasarca 01/31/2017   Bilateral lower extremity edema 01/31/2017   OSA (obstructive sleep apnea)    PCP:  [name], PA-C Pharmacy:   [id number] [place], [place] - [address] [place] [address] Phone: [phone] Fax: [phone]  [id number] [place],  - [address] AT [facility] [address] [id number]  [address] [place] [address] Phone: [phone] Fax: [phone]     Social Drivers of Health (SDOH) Social History: SDOH Screenings   Food Insecurity: No Food Insecurity (10/20/2023)  Housing: Low Risk  (10/20/2023)  Transportation Needs: No Transportation Needs (10/20/2023)  Recent Concern: Transportation Needs - Unmet  Transportation Needs (10/03/2023)  Utilities: Not At Risk (10/20/2023)  Alcohol Screen: Low Risk  (06/08/2021)  Financial Resource Strain: Medium Risk (12/06/2021)   Received from [facility]  Physical Activity: Unknown (12/06/2021)   Received from [facility]  Social Connections: Socially Isolated (10/20/2023)  Stress: No Stress Concern Present (01/21/2022)   Received from [facility], [facility]  Recent Concern: Stress - Stress Concern Present (11/19/2021)   Received from [facility]  Tobacco Use: Medium Risk (10/20/2023)   SDOH Interventions:     Readmission Risk Interventions    07/24/2023   11:56 AM 05/08/2023    4:37 PM 01/25/2023    4:23 PM  Readmission Risk Prevention Plan  Transportation Screening Complete Complete Complete  PCP or Specialist Appt within 3-5 Days   Complete  HRI or Home Care Consult   Complete  Social Work Consult for Recovery Care Planning/Counseling   Complete  Palliative Care Screening   Not Applicable  Medication Review Oceanographer) Complete Complete Referral to Pharmacy  PCP or Specialist appointment within 3-5 days of discharge Complete Complete   HRI or Home Care Consult Complete Complete   SW Recovery Care/Counseling Consult Complete Complete   Palliative Care Screening Not Applicable Complete   Skilled Nursing Facility Complete Not Applicable

## 2023-10-24 DIAGNOSIS — J101 Influenza due to other identified influenza virus with other respiratory manifestations: Secondary | ICD-10-CM | POA: Diagnosis not present

## 2023-10-24 DIAGNOSIS — J189 Pneumonia, unspecified organism: Secondary | ICD-10-CM | POA: Diagnosis not present

## 2023-10-24 DIAGNOSIS — J9601 Acute respiratory failure with hypoxia: Secondary | ICD-10-CM | POA: Diagnosis not present

## 2023-10-24 LAB — MRSA NEXT GEN BY PCR, NASAL: MRSA by PCR Next Gen: NOT DETECTED

## 2023-10-24 LAB — RENAL FUNCTION PANEL
Albumin: 3.2 g/dL — ABNORMAL LOW (ref 3.5–5.0)
Anion gap: 13 (ref 5–15)
BUN: 31 mg/dL — ABNORMAL HIGH (ref 8–23)
CO2: 31 mmol/L (ref 22–32)
Calcium: 8.7 mg/dL — ABNORMAL LOW (ref 8.9–10.3)
Chloride: 97 mmol/L — ABNORMAL LOW (ref 98–111)
Creatinine, Ser: 1.18 mg/dL (ref 0.61–1.24)
GFR, Estimated: >60 mL/min (ref >60)
Glucose, Bld: 199 mg/dL — ABNORMAL HIGH (ref 70–99)
Phosphorus: 3.3 mg/dL (ref 2.5–4.6)
Potassium: 3.4 mmol/L — ABNORMAL LOW (ref 3.5–5.1)
Sodium: 141 mmol/L (ref 135–145)

## 2023-10-24 LAB — GLUCOSE, CAPILLARY
Glucose-Capillary: 213 mg/dL — ABNORMAL HIGH (ref 70–99)
Glucose-Capillary: 221 mg/dL — ABNORMAL HIGH (ref 70–99)
Glucose-Capillary: 212 mg/dL — ABNORMAL HIGH (ref 70–99)
Glucose-Capillary: 235 mg/dL — ABNORMAL HIGH (ref 70–99)

## 2023-10-24 LAB — CBC
HCT: 43.5 % (ref 39.0–52.0)
Hemoglobin: 14.5 g/dL (ref 13.0–17.0)
MCH: 29.9 pg (ref 26.0–34.0)
MCHC: 33.3 g/dL (ref 30.0–36.0)
MCV: 89.7 fL (ref 80.0–100.0)
Platelets: 218 10*3/uL (ref 150–400)
RBC: 4.85 MIL/uL (ref 4.22–5.81)
RDW: 14.6 % (ref 11.5–15.5)
WBC: 6 10*3/uL (ref 4.0–10.5)
nRBC: 0 % (ref 0.0–0.2)

## 2023-10-24 MED ORDER — POTASSIUM CHLORIDE CRYS ER 20 MEQ PO TBCR
40.0000 meq | EXTENDED_RELEASE_TABLET | Freq: Once | ORAL | Status: AC
  Administered 2023-10-24: 40 meq via ORAL
  Filled 2023-10-24: qty 2

## 2023-10-24 MED ORDER — REVEFENACIN 175 MCG/3ML IN SOLN
175.0000 ug | Freq: Every day | RESPIRATORY_TRACT | Status: DC
  Administered 2023-10-25: 175 ug via RESPIRATORY_TRACT
  Filled 2023-10-24: qty 3

## 2023-10-24 MED ORDER — INSULIN GLARGINE 100 UNIT/ML ~~LOC~~ SOLN
3.0000 [IU] | Freq: Once | SUBCUTANEOUS | Status: AC
  Administered 2023-10-24: 3 [IU] via SUBCUTANEOUS
  Filled 2023-10-24: qty 0.03

## 2023-10-24 MED ORDER — INSULIN GLARGINE 100 UNIT/ML ~~LOC~~ SOLN
13.0000 [IU] | Freq: Every day | SUBCUTANEOUS | Status: DC
  Administered 2023-10-25: 13 [IU] via SUBCUTANEOUS
  Filled 2023-10-24: qty 0.13

## 2023-10-24 NOTE — TOC Progression Note (Signed)
 Transition of Care [facility]) - Progression Note    Patient Details  Name: Lawrence [name] MRN: [id number] Date of Birth: [birth year]  Transition [facility]) CM/SW Contact  Michaela Corner, [place] Phone Number: 10/24/2023, 10:34 AM  Clinical Narrative:  CSW awaiting PT/OT eval to submit for insurance auth.   TOC will continue to follow.   Expected Discharge Plan: Skilled Nursing Facility Barriers to Discharge: Continued Medical Work up, English as a second language teacher  Expected Discharge Plan and Services In-house Referral: Clinical Social Work     Living arrangements for the past 2 months: Skilled Holiday representative                   DME Agency: NA       HH Arranged: Charity fundraiser, Nurse's Aide HH Agency: [facility] ([facility]) Date HH Agency Contacted: 10/04/23 Time HH Agency Contacted: 1100 Representative spoke with at [facility] Agency: Adele Dan   Social Determinants of Health (SDOH) Interventions SDOH Screenings   Food Insecurity: No Food Insecurity (10/20/2023)  Housing: Low Risk  (10/20/2023)  Transportation Needs: No Transportation Needs (10/20/2023)  Recent Concern: Transportation Needs - Unmet Transportation Needs (10/03/2023)  Utilities: Not At Risk (10/20/2023)  Alcohol Screen: Low Risk  (06/08/2021)  Financial Resource Strain: Medium Risk (12/06/2021)   Received from [facility]  Physical Activity: Unknown (12/06/2021)   Received from [facility]  Social Connections: Socially Isolated (10/20/2023)  Stress: No Stress Concern Present (01/21/2022)   Received from [facility]  Recent Concern: Stress - Stress Concern Present (11/19/2021)   Received from [facility]  Tobacco Use: Medium Risk (10/20/2023)    Readmission Risk Interventions    07/24/2023   11:56 AM 05/08/2023    4:37 PM 01/25/2023    4:23 PM  Readmission Risk Prevention Plan  Transportation Screening Complete Complete Complete  PCP or Specialist Appt within 3-5 Days    Complete  HRI or Home Care Consult   Complete  Social Work Consult for Recovery Care Planning/Counseling   Complete  Palliative Care Screening   Not Applicable  Medication Review Oceanographer) Complete Complete Referral to Pharmacy  PCP or Specialist appointment within 3-5 days of discharge Complete Complete   HRI or Home Care Consult Complete Complete   SW Recovery Care/Counseling Consult Complete Complete   Palliative Care Screening Not Applicable Complete   Skilled Nursing Facility Complete Not Applicable

## 2023-10-24 NOTE — TOC Progression Note (Addendum)
 Transition of Care [facility]) - Progression Note    Patient Details  Name: Lawrence [name] MRN: [id number] Date of Birth: 06/15/1956  Transition of Care [facility]) CM/SW Contact  Michaela Corner, [place] Phone Number: 10/24/2023, 12:27 PM  Clinical Narrative:   CSW submitted for insurance auth at this time. Auth id [id number].   2:48 PM Auth is still pending at this time. CSW updated MD.   3:59 PM Auth approved for [employer]. Approval dates 10/24/2023-10/26/2023.  CSW reached out to [place] to ask if patient can dc to facility tomorrow.   4:13 PM Per Wadie Lessen, patient discharging back to them is the plan but facility will need to work on making sure he has a room. CSW was told to contact [place] in the morning for follow up on discharge.   4:30 PM Wadie Lessen inquired about patients droplet pre. Per MD, pt will be off droplet tomorrow and last dose of Tamiflu tonight. CSW provided this information to [place] at [place].   TOC will continue to follow.   Expected Discharge Plan: Skilled Nursing Facility Barriers to Discharge: Continued Medical Work up, English as a second language teacher  Expected Discharge Plan and Services In-house Referral: Clinical Social Work     Living arrangements for the past 2 months: Skilled Holiday representative                   DME Agency: NA       HH Arranged: Charity fundraiser, Nurse's Aide HH Agency: [facility] ([facility]) Date HH Agency Contacted: 10/04/23 Time HH Agency Contacted: 1100 Representative spoke with at [facility] Agency: Adele Dan   Social Determinants of Health (SDOH) Interventions SDOH Screenings   Food Insecurity: No Food Insecurity (10/20/2023)  Housing: Low Risk  (10/20/2023)  Transportation Needs: No Transportation Needs (10/20/2023)  Recent Concern: Transportation Needs - Unmet Transportation Needs (10/03/2023)  Utilities: Not At Risk (10/20/2023)  Alcohol Screen: Low Risk  (06/08/2021)  Financial Resource Strain: Medium Risk (12/06/2021)   Received from [facility]  Physical Activity: Unknown (12/06/2021)   Received from [facility]  Social Connections: Socially Isolated (10/20/2023)  Stress: No Stress Concern Present (01/21/2022)   Received from [facility]  Recent Concern: Stress - Stress Concern Present (11/19/2021)   Received from [facility]  Tobacco Use: Medium Risk (10/20/2023)    Readmission Risk Interventions    07/24/2023   11:56 AM 05/08/2023    4:37 PM 01/25/2023    4:23 PM  Readmission Risk Prevention Plan  Transportation Screening Complete Complete Complete  PCP or Specialist Appt within 3-5 Days   Complete  HRI or Home Care Consult   Complete  Social Work Consult for Recovery Care Planning/Counseling   Complete  Palliative Care Screening   Not Applicable  Medication Review [employer]) Complete Complete Referral to Pharmacy  PCP or Specialist appointment within 3-5 days of discharge Complete Complete   HRI or Home Care Consult Complete Complete   SW Recovery Care/Counseling Consult Complete Complete   Palliative Care Screening Not Applicable Complete   Skilled Nursing Facility Complete Not Applicable

## 2023-10-24 NOTE — Progress Notes (Signed)
 [facility]                                                                              Lawrence [name], is a 68 y.o. male, DOB - [birth year], ZOX:[id number] Admit date - 10/20/2023    Outpatient Primary MD for the patient is [name], PA-C  LOS - 4  days  Chief Complaint  Patient presents with   Shortness of Breath       Brief summary   Patient is a 68 y.o. male with PMH of COPD, severe aortic stenosis status post TAVR in July 2024, seizure disorder, chronic diastolic CHF (follows Dr. Shirlee Latch), atrial fibrillation on Eliquis, gout, anxiety, BPH, chronic venous stasis, morbid obesity presented from SNF with worsening shortness of breath in the last 2 days.  Patient is somewhat poor historian, reports that he was having subjective fevers and chills, shortness of breath, sleeps on the hospital bed sitting upright, felt congested.  He was sent to the ED and was noted to be hypoxic with O2 sats 88% by the EMS.  Per patient, at baseline he does not use any O2. No nausea vomiting, chest pain or any diarrhea.  In ED, patient was noted to have temp of 102.2 F, RR 24, heart rate 98-103, BP 153/71, O2 sats 96% on 5 L O2 via [place]  Lactic acid 2.0, BNP 235 Respiratory virus panel showed influenza A positive.  RSV, COVID-negative. Chest x-ray showed new bandlike opacity in the right lower lung may reflect atelectasis and/or airspace disease.   Assessment & Plan    Principal Problem:     Sepsis with Acute respiratory failure with hypoxia (HCC)   Influenza A with pneumonia -Met SIRS criteria on admission, presented with fevers, congestion, shortness of breath, new hypoxia on 5 L O2 via [place], not on O2 at baseline, lactic acidosis, AKI.  Chest x-ray with new bandlike opacity in the right lower lung atelectasis versus infiltrates. --Urine strep antigen negative  -BC ID Staph species+, blood cultures showed 1 out of 2 Staph warneri, likely contaminant -Pulmonary hygiene, I-S,  flutter valve, DuoNebs, Tessalon Perles -Weaned off of O2, O2 sats 90 to 94% on room air -Will complete Tamiflu today  -On IV Rocephin, Zithromax    Active Problems: COPD with acute exacerbation -Noted diffuse wheezing today bilaterally, possibly acute COPD exacerbation with pulmonary edema -Resumed torsemide, placed on scheduled DuoNebs, Pulmicort, Brovana -Continue flutter valve, I-S -Wheezing improving, continue oral prednisone    Hypokalemia -Replace as needed     Acute kidney injury superimposed on stage 3a chronic kidney disease (HCC) -Baseline creatinine 1.1, creatinine 1.3 on admission, lactic acidosis, received IV fluid bolus in ED -Creatinine stable at baseline, continue torsemide, spironolactone     Diabetes mellitus type 2, with diabetic polyneuropathy -Hemoglobin A1c 6.6 on 10/04/2023 -Placed on sliding scale insulin while inpatient CBG (last 3)  Recent Labs    10/23/23 2056 10/24/23 0526 10/24/23 1122  GLUCAP 226* 213* 221*   CBG slightly elevated due to steroids -Increase Lantus to 13 units, continue NovoLog meal coverage 5 units 3 times daily AC, continue SSI      Pure  hypercholesterolemia -Continue statin, fenofibrate     Primary hypertension -BP stable, continue Toprol-XL, spironolactone, torsemide    Chronic diastolic CHF -2D echo 03/10/2023 had shown EF of 55 to 60%, G1 DD, normal right ventricular systolic function, TAVR+ -BNP slightly elevated however no acute pulmonary edema, volume status difficult to assess due to morbid obesity and ongoing pulmonary issues -Cardiology following resumed torsemide, Aldactone -Negative balance of 10.8 L , weight 348.33lbs on admission, 338 today     Seizure disorder (HCC) -Continue Keppra, lacosamide       S/P TAVR (transcatheter aortic valve replacement) -No acute issues   Prior history of CVA (cerebral vascular accident) (HCC) -Continue statin, eliquis     Atrial fibrillation, chronic (HCC) -Heart rate  controlled, resumed Toprol-XL -Continue eliquis     Obesity, Class III, BMI 40-49.9 (morbid obesity) (HCC) -Currently on Mounjaro outpatient   Anxiety, depression -Patient is on several psych medications, will resume -Continue trazodone, mirtazapine, BuSpar, Effexor, Abilify   Obesity class III Estimated body mass index is 43.17 kg/m as calculated from the following:   Height as of this encounter: 6\' 2"  (1.88 m).   Weight as of this encounter: 152.5 kg.  Code Status: DNR DVT Prophylaxis:   apixaban (ELIQUIS) tablet 5 mg   Level of Care: Level of care: Progressive Family Communication: Updated patient Disposition Plan:      Remains inpatient appropriate:   Hopefully DC back to SNF once authorization approved   Procedures:    Consultants:   Cardiology  Antimicrobials:   Anti-infectives (From admission, onward)    Start     Dose/Rate Route Frequency Ordered Stop   10/23/23 1000  azithromycin (ZITHROMAX) tablet 500 mg        500 mg Oral Daily 10/22/23 1225 10/24/23 0928   10/21/23 1000  azithromycin (ZITHROMAX) 500 mg in sodium chloride 0.9 % 250 mL IVPB  Status:  Discontinued        500 mg 250 mL/hr over 60 Minutes Intravenous Every 24 hours 10/20/23 1044 10/22/23 1225   10/21/23 1000  cefTRIAXone (ROCEPHIN) 2 g in sodium chloride 0.9 % 100 mL IVPB        2 g 200 mL/hr over 30 Minutes Intravenous Every 24 hours 10/20/23 1045 10/24/23 1023   10/20/23 1000  cefTRIAXone (ROCEPHIN) 2 g in sodium chloride 0.9 % 100 mL IVPB  Status:  Discontinued        2 g 200 mL/hr over 30 Minutes Intravenous Every 24 hours 10/20/23 0954 10/20/23 1045   10/20/23 1000  azithromycin (ZITHROMAX) 500 mg in sodium chloride 0.9 % 250 mL IVPB  Status:  Discontinued        500 mg 250 mL/hr over 60 Minutes Intravenous Every 24 hours 10/20/23 0954 10/20/23 1043   10/20/23 0830  oseltamivir (TAMIFLU) capsule 75 mg        75 mg Oral 2 times daily 10/20/23 0820 10/25/23 0959   10/20/23 0645   cefTRIAXone (ROCEPHIN) 1 g in sodium chloride 0.9 % 100 mL IVPB        1 g 200 mL/hr over 30 Minutes Intravenous  Once 10/20/23 0637 10/20/23 0711   10/20/23 0645  azithromycin (ZITHROMAX) 500 mg in sodium chloride 0.9 % 250 mL IVPB        500 mg 250 mL/hr over 60 Minutes Intravenous  Once 10/20/23 0637 10/20/23 0829          Medications  allopurinol  100 mg Oral Daily   apixaban  5 mg  Oral BID   arformoterol  15 mcg Nebulization BID   ARIPiprazole  5 mg Oral QHS   atorvastatin  40 mg Oral QHS   benzonatate  200 mg Oral TID   budesonide (PULMICORT) nebulizer solution  0.25 mg Nebulization BID   busPIRone  10 mg Oral TID   colchicine  0.6 mg Oral Daily   cyanocobalamin  1,000 mcg Oral Daily   fenofibrate  160 mg Oral Daily   ferrous sulfate  325 mg Oral Daily   fluticasone  2 spray Each Nare Daily   icosapent Ethyl  2 g Oral BID   insulin aspart  0-15 Units Subcutaneous TID WC   insulin aspart  0-5 Units Subcutaneous QHS   insulin aspart  5 Units Subcutaneous TID WC   insulin glargine  10 Units Subcutaneous Daily   lacosamide  150 mg Oral BID   levETIRAcetam  500 mg Oral BID   metoprolol succinate  25 mg Oral Daily   mirtazapine  15 mg Oral QHS   oseltamivir  75 mg Oral BID   pantoprazole  40 mg Oral Daily   polyethylene glycol  17 g Oral Daily   prazosin  2 mg Oral QHS   predniSONE  40 mg Oral QAC breakfast   pregabalin  150 mg Oral TID   repaglinide  2 mg Oral BID   revefenacin  175 mcg Nebulization Daily   rOPINIRole  0.25 mg Oral BID   senna-docusate  2 tablet Oral BID   sodium chloride flush  3 mL Intravenous Q12H   spironolactone  12.5 mg Oral Daily   torsemide  60 mg Oral BID   traZODone  200 mg Oral QHS   venlafaxine XR  150 mg Oral Q breakfast      Subjective:   Lawrence [name] was seen and examined today.  Feels a whole lot better today, weaned off of O2.  Wheezing improved.  Hoping he will be able to discharge today.   Objective:   Vitals:    10/24/23 0751 10/24/23 0910 10/24/23 1122 10/24/23 1438  BP: 127/88  121/68 123/71  Pulse: 72  71 68  Resp: 20  17 15   Temp: 98 F (36.7 C)  97.8 F (36.6 C) 97.9 F (36.6 C)  TempSrc: Oral  Oral Oral  SpO2: 94% 93% 90% 90%  Weight:      Height:        Intake/Output Summary (Last 24 hours) at 10/24/2023 1444 Last data filed at 10/24/2023 1443 Gross per 24 hour  Intake 480 ml  Output 5000 ml  Net -4520 ml     Wt Readings from Last 3 Encounters:  10/24/23 (!) 152.5 kg  10/10/23 (!) 157 kg  10/07/23 (!) 156.6 kg   Physical Exam General: Alert and oriented x 3, NAD Cardiovascular: S1 S2 clear, RRR.  Respiratory: CTAB, no wheezing Gastrointestinal: Soft, nontender, nondistended, NBS Ext: no pedal edema bilaterally Neuro: no new deficits Psych: Normal affect     Data Reviewed:  I have personally reviewed following labs    CBC Lab Results  Component Value Date   WBC 6.0 10/24/2023   RBC 4.85 10/24/2023   HGB 14.5 10/24/2023   HCT 43.5 10/24/2023   MCV 89.7 10/24/2023   MCH 29.9 10/24/2023   PLT 218 10/24/2023   MCHC 33.3 10/24/2023   RDW 14.6 10/24/2023   LYMPHSABS 1.7 10/03/2023   MONOABS 0.8 10/03/2023   EOSABS 0.4 10/03/2023   BASOSABS 0.0 10/03/2023  Last metabolic panel Lab Results  Component Value Date   NA 141 10/24/2023   K 3.4 (L) 10/24/2023   CL 97 (L) 10/24/2023   CO2 31 10/24/2023   BUN 31 (H) 10/24/2023   CREATININE 1.18 10/24/2023   GLUCOSE 199 (H) 10/24/2023   GFRNONAA >60 10/24/2023   GFRAA 77 05/20/2020   CALCIUM 8.7 (L) 10/24/2023   PHOS 3.3 10/24/2023   PROT 6.5 10/20/2023   ALBUMIN 3.2 (L) 10/24/2023   LABGLOB 2.8 02/08/2023   BILITOT 0.7 10/20/2023   ALKPHOS 46 10/20/2023   AST 26 10/20/2023   ALT 28 10/20/2023   ANIONGAP 13 10/24/2023    CBG (last 3)  Recent Labs    10/23/23 2056 10/24/23 0526 10/24/23 1122  GLUCAP 226* 213* 221*      Coagulation Profile: No results for input(s): "INR", "PROTIME" in the  last 168 hours.   Radiology Studies: I have personally reviewed the imaging studies  No results found.      Thad Ranger M.D. [facility] 10/24/2023, 2:44 PM  Available via Epic secure chat 7am-7pm After 7 pm, please refer to night coverage provider listed on amion.

## 2023-10-24 NOTE — Evaluation (Signed)
 Physical Therapy Evaluation Patient Details Name: Lawrence [name] MRN: [id number] DOB: [birth year] Today's Date: 10/24/2023  History of Present Illness  Pt is a 68 y.o. male who presented 10/20/23 due to SOB from SNF. Pt found + for influenza A and chest x ray showed new bandlike opacity. Pt was admitted with sepsis with acute respiratory failure. PMH atrial fibrillation on anticoagulation, prior CVA with residual left-sided weakness, Nov '24 L cerebellar CVA, type 2 diabetes with neuropathy, seizure disorder on antiepileptics, CAD, COPD, OSA, spinal stimulator, bil LE stasis ulcers, history of severe aortic stenosis status post TAVR.   Clinical Impression  Pt admitted with above diagnosis. PTA, pt was residing at [facility] receiving therapy services and requiring assistance for functional mobility and ADLs. Pt currently with functional limitations due to the deficits listed below (see PT Problem List). He performed bed mobility with supervision-CGA using bedrails. Pt performed 5 sit-to-stand transfers using the sara stedy to various surfaces including BSC, bed, and chair. As he fatigued, pt required increased assistance for hip clearance in order to rest on the stedy seat. He is highly motivated and willing to participate, but requires intermittent rest breaks. Pt will benefit from acute skilled PT to increase his independence and safety with mobility to allow discharge.  Recommend return to post-acute rehab, <3 hours/day therapy.     If plan is discharge home, recommend the following: Two people to help with walking and/or transfers;Two people to help with bathing/dressing/bathroom;Assist for transportation;Help with stairs or ramp for entrance   Can travel by private vehicle   No    Equipment Recommendations None recommended by PT (Pt already has DME)  Recommendations for Other Services       Functional Status Assessment Patient has had a recent decline in their functional status and  demonstrates the ability to make significant improvements in function in a reasonable and predictable amount of time.     Precautions / Restrictions Precautions Precautions: Fall Recall of Precautions/Restrictions: Intact Restrictions Weight Bearing Restrictions Per Provider Order: No      Mobility  Bed Mobility Overal bed mobility: Needs Assistance Bed Mobility: Supine to Sit, Sit to Supine, Rolling Rolling: Used rails, Contact guard assist   Supine to sit: HOB elevated, Used rails, Contact guard Sit to supine: Supervision   General bed mobility comments: Pt sat up on R side of bed with increased time, HOB elevated to 60deg, and use of bedrail. He was able to scoot fwd and laterally with BUE support on bed. Returning to supine pt brought BLE back into bed and was able to assist in repositioning by pulling on bedrails. Pt rolled R/L for hygiene to be addressed.    Transfers Overall transfer level: Needs assistance Equipment used: Ambulation equipment used Antony Salmon) Transfers: Sit to/from Stand, Bed to chair/wheelchair/BSC Sit to Stand: Via lift equipment, Max assist, +2 physical assistance, +2 safety/equipment          Lateral/Scoot Transfers: Contact guard assist General transfer comment: Pt completed 5 stands using stedy. He required maxA x2 to power up into standing and lacked full hip ext despite VC/TC. Pt was able to maintain static stance for hygiene to be addressed for ~40mins with excessive fwd lean, but quickly fatigued and had to return to bed for pericare to be completed. On the last stand d/t fatigue pt required a 3rd person to facilitate hip positioning to allow clearence onto stedy seat. Good eccentric control with sitting. Transfer via Lift Equipment: Stedy  Ambulation/Gait  Stairs            Wheelchair Mobility     Tilt Bed    Modified Rankin (Stroke Patients Only)       Balance Overall balance assessment: Needs  assistance Sitting-balance support: Feet supported, Bilateral upper extremity supported Sitting balance-Leahy Scale: Fair Sitting balance - Comments: Pt sat EOB with supervision and engaged in scooting with SBA.                                     Pertinent Vitals/Pain Pain Assessment Pain Assessment: 0-10 Faces Pain Scale: Hurts a little bit Pain Location: R anterior knee Pain Descriptors / Indicators: Aching, Discomfort Pain Intervention(s): Monitored during session, Limited activity within patient's tolerance    Home Living Family/patient expects to be discharged to:: Skilled nursing facility Living Arrangements: Spouse/significant other Available Help at Discharge: Family;Personal care attendant;Available PRN/intermittently (Prior to SNF pt had a PCA 4 days/week 8:30-11:30am) Type of Home: Apartment Home Access: Level entry       Home Layout: One level Home Equipment: Rollator (4 wheels);Electric scooter;Hospital bed;BSC/3in1;Lift chair Additional Comments: PTA 4 days/week 8:30-11:30am    Prior Function Prior Level of Function : Needs assist       Physical Assist : ADLs (physical);Mobility (physical) Mobility (physical): Transfers;Gait;Stairs;Bed mobility ADLs (physical): Bathing;Dressing;Toileting;IADLs Mobility Comments: Pt reports at [name] he was recieveing assistance from facility staff for all mobility, completing transfers using a lift, and mobilizing in a w/c. Pt reports he would propell w/c using hands or feet, but required assistance from staff when fatigued. ADLs Comments: Pt reports at [facility] he was recieveing assistance from facility staff for most ADLs. He reports attempting to assist with any activities as much as he could.     Extremity/Trunk Assessment   Upper Extremity Assessment Upper Extremity Assessment: Defer to OT evaluation    Lower Extremity Assessment Lower Extremity Assessment: Generalized weakness    Cervical  / Trunk Assessment Cervical / Trunk Assessment: Other exceptions Cervical / Trunk Exceptions: Body Habitus  Communication   Communication Communication: No apparent difficulties    Cognition Arousal: Alert Behavior During Therapy: WFL for tasks assessed/performed   PT - Cognitive impairments: No apparent impairments                       PT - Cognition Comments: Pt A,Ox4 Following commands: Intact       Cueing Cueing Techniques: Verbal cues     General Comments General comments (skin integrity, edema, etc.): Pt reported intermittent dizziness during session, resolved with resting periods. BP: seated EOB 99/56 (66), standing 119/78 (90), seated EOB 133/85 (101). SpO2 >88% on RA throughout session, seen post-nebulizer tx.    Exercises     Assessment/Plan    PT Assessment Patient needs continued PT services  PT Problem List Decreased strength;Decreased activity tolerance;Decreased mobility;Decreased balance;Decreased knowledge of use of DME;Pain       PT Treatment Interventions DME instruction;Balance training;Modalities;Gait training;Neuromuscular re-education;Stair training;Therapeutic activities;Therapeutic exercise;Functional mobility training;Patient/family education;Wheelchair mobility training;Manual techniques    PT Goals (Current goals can be found in the Care Plan section)  Acute Rehab PT Goals Patient Stated Goal: Return to rehab and get stronger PT Goal Formulation: With patient Time For Goal Achievement: 11/07/23 Potential to Achieve Goals: Fair    Frequency Min 2X/week     Co-evaluation   Reason for Co-Treatment: For patient/therapist safety PT goals addressed  during session: Balance;Mobility/safety with mobility OT goals addressed during session: ADL's and self-care       AM-PAC PT "6 Clicks" Mobility  Outcome Measure Help needed turning from your back to your side while in a flat bed without using bedrails?: A Little Help needed moving  from lying on your back to sitting on the side of a flat bed without using bedrails?: A Lot Help needed moving to and from a bed to a chair (including a wheelchair)?: Total Help needed standing up from a chair using your arms (e.g., wheelchair or bedside chair)?: Total Help needed to walk in hospital room?: Total Help needed climbing 3-5 steps with a railing? : Total 6 Click Score: 9    End of Session Equipment Utilized During Treatment: Gait belt Activity Tolerance: Patient tolerated treatment well;Patient limited by fatigue Patient left: in chair;with call bell/phone within reach;with chair alarm set Nurse Communication: Mobility status;Need for lift equipment PT Visit Diagnosis: Muscle weakness (generalized) (M62.81);Other abnormalities of gait and mobility (R26.89);Difficulty in walking, not elsewhere classified (R26.2);Pain Pain - Right/Left: Right Pain - part of body: Knee    Time: 0910-1003 PT Time Calculation (min) (ACUTE ONLY): 53 min   Charges:   PT Evaluation $PT Eval Moderate Complexity: 1 Mod PT Treatments $Therapeutic Activity: 8-22 mins PT General Charges $$ ACUTE PT VISIT: 1 Visit         Cheri Guppy, PT, DPT Acute Rehabilitation Services Office: [phone] Secure Chat Preferred  Richardson Chiquito 10/24/2023, 11:50 AM

## 2023-10-24 NOTE — Evaluation (Signed)
 Occupational Therapy Evaluation Patient Details Name: Lawrence [name] MRN: [id number] DOB: 1956/05/24 Today's Date: 10/24/2023   History of Present Illness   Pt is a 68 yr old male who presented 10/20/23 due to SOB from SNF. Pt found + for influenza A and chest x ray showed new bandlike opacity. Pt was admitted with sepsis with acute respiratory failure. PMH atrial fibrillation on anticoagulation, prior CVA with residual left-sided weakness, Nov '24 L cerebellar CVA, type 2 diabetes with neuropathy, seizure disorder on antiepileptics, CAD, COPD, OSA, spinal stimulator, bil LE stasis ulcers, history of severe aortic stenosis status post TAVR     Clinical Impressions Pt was very motivated to participate in session and agreeable to return back to SNF once medically stable. At this time pt completed supine to sitting with CGA with HOB elevated and bed rail. Pt then completed 5 sit to stand transfers to steady with max x2 for 4/5 and max x3 for 1/5 transfers to be able to clear enough surface for steady. Pt completed BM on BSC and then required total assist for peri care. Patient will benefit from continued inpatient follow up therapy, <3 hours/day.     If plan is discharge home, recommend the following:   Two people to help with walking and/or transfers;Two people to help with bathing/dressing/bathroom;Assistance with cooking/housework;Assist for transportation     Functional Status Assessment   Patient has had a recent decline in their functional status and demonstrates the ability to make significant improvements in function in a reasonable and predictable amount of time.     Equipment Recommendations   [employer] lift     Recommendations for [employer]         Precautions/Restrictions   Precautions Precautions: Fall Recall of Precautions/Restrictions: Intact Precaution/Restrictions Comments: Pt reported to many falls to report. Has BLE weakness and decrease in sensation in  BLE Restrictions Weight Bearing Restrictions Per Provider Order: No     Mobility Bed Mobility Overal bed mobility: Needs Assistance Bed Mobility: Supine to Sit     Supine to sit: Contact guard Sit to supine: Supervision        Transfers Overall transfer level: Needs assistance Equipment used:  (steady) Transfers: Sit to/from Stand Sit to Stand: +2 physical assistance, +2 safety/equipment, Via lift equipment          Lateral/Scoot Transfers: Contact guard assist, From elevated surface General transfer comment: Pt needed x3 assist on 5th transfers as started to fatigue with ability to increase in clerance to place steady under pt Transfer via Lift Equipment: Stedy    Balance Overall balance assessment: Needs assistance Sitting-balance support: Feet supported, Bilateral upper extremity supported Sitting balance-Leahy Scale: Fair Sitting balance - Comments: when completion of any BLE movement they need slight increase in assist and noted some dizziness when intially coming into a seated position                                   ADL either performed or assessed with clinical judgement   ADL Overall ADL's : Needs assistance/impaired Eating/Feeding: Independent;Sitting   Grooming: Wash/dry hands;Wash/dry face;Set up;Sitting   Upper Body Bathing: Contact guard assist;Set up;Sitting   Lower Body Bathing: Moderate assistance;Bed level Lower Body Bathing Details (indicate cue type and reason): long sitting Upper Body Dressing : Set up;Sitting   Lower Body Dressing: Contact guard assist;Bed level Lower Body Dressing Details (indicate cue type and reason): pt was able  to long sit and with increase in time was able to don BLE Toilet Transfer: +2 for physical assistance;+2 for safety/equipment;BSC/3in1 (with steady)   Toileting- Clothing Manipulation and Hygiene: Total assistance;+2 for physical assistance;+2 for safety/equipment;Sit to/from stand Toileting -  Clothing Manipulation Details (indicate cue type and reason): max x2 to sit to stand and then total assist while stadning to complete             Vision         Perception         Praxis         Pertinent Vitals/Pain Pain Assessment Pain Assessment: 0-10 Faces Pain Scale: Hurts a little bit Pain Location: B knees with stadning Pain Descriptors / Indicators: Aching Pain Intervention(s): Limited activity within patient's tolerance     Extremity/Trunk Assessment Upper Extremity Assessment Upper Extremity Assessment: Generalized weakness (LUE weaker then the RLE)   Lower Extremity Assessment Lower Extremity Assessment: Defer to PT evaluation       Communication Communication Communication: No apparent difficulties   Cognition Arousal: Alert Behavior During Therapy: WFL for tasks assessed/performed Cognition: No apparent impairments                               Following commands: Intact       Cueing  General Comments   Cueing Techniques: Verbal cues      Exercises     Shoulder Instructions      Home Living Family/patient expects to be discharged to:: Skilled nursing facility [facility]) Living Arrangements: Spouse/significant other Available Help at Discharge: Family;Personal care attendant Type of Home: Apartment Home Access: Level entry     Home Layout: One level     Bathroom Shower/Tub: Tub/shower unit;Sponge bathes at baseline   [employer]: Standard Bathroom Accessibility: Yes How Accessible: Accessible via walker Home Equipment: Rollator (4 wheels);Electric scooter;Hospital bed;BSC/3in1;Lift chair   Additional Comments: PTA 4 days/week 8:30-11:30am      Prior Functioning/Environment Prior Level of Function : Needs assist       Physical Assist : ADLs (physical)   ADLs (physical): Bathing;Dressing Mobility Comments: was using rollator, however now sits on rollator and propels with feet/is pushed by  girlfriend, assist for transfers ADLs Comments: assist for bathing/dressing    OT Problem List: Decreased strength;Decreased range of motion;Decreased activity tolerance;Impaired balance (sitting and/or standing);Decreased safety awareness   OT Treatment/Interventions: Self-care/ADL training;Therapeutic exercise;DME and/or AE instruction;Energy conservation;Therapeutic activities;Patient/family education;Balance training      OT Goals(Current goals can be found in the care plan section)   Acute Rehab OT Goals Patient Stated Goal: to be able to get up OT Goal Formulation: With patient Time For Goal Achievement: 11/07/23 Potential to Achieve Goals: Fair ADL Goals Pt Will Perform Grooming: Independently;sitting Pt Will Perform Upper Body Bathing: with modified independence;sitting Pt Will Perform Lower Body Bathing: with mod assist;sit to/from stand Pt Will Transfer to Toilet: with max assist;squat pivot transfer Additional ADL Goal #1: Pt will be able to squat pivot transfer with max assist to next surface   OT Frequency:  Min 1X/week    Co-evaluation PT/OT/SLP Co-Evaluation/Treatment: Yes Reason for Co-Treatment: For patient/therapist safety   OT goals addressed during session: ADL's and self-care      AM-PAC OT "6 Clicks" Daily Activity     Outcome Measure Help from another person eating meals?: None Help from another person taking care of personal grooming?: A Little Help from another person toileting,  which includes using toliet, bedpan, or urinal?: A Lot Help from another person bathing (including washing, rinsing, drying)?: A Lot Help from another person to put on and taking off regular upper body clothing?: A Little Help from another person to put on and taking off regular lower body clothing?: A Lot 6 Click Score: 16   End of Session Equipment Utilized During Treatment: Gait belt Nurse Communication: Mobility status  Activity Tolerance: Patient tolerated treatment  well Patient left: in chair;with call bell/phone within reach;with chair alarm set;with nursing/sitter in room  OT Visit Diagnosis: Unsteadiness on feet (R26.81);Other abnormalities of gait and mobility (R26.89);Repeated falls (R29.6);Muscle weakness (generalized) (M62.81)                Time: 4782-9562 OT Time Calculation (min): 53 min Charges:  OT General Charges $OT Visit: 1 Visit OT Evaluation $OT Eval Low Complexity: 1 Low OT Treatments $Self Care/Home Management : 8-22 mins  Presley Raddle OTR/L  Acute Rehab Services  [phone] office number   Alphia Moh 10/24/2023, 10:43 AM

## 2023-10-24 NOTE — NC FL2 (Signed)
 [place] MEDICAID FL2 LEVEL OF CARE FORM     IDENTIFICATION  Patient Name: Lawrence [name] Birthdate: 12-02-1955 Sex: male Admission Date (Current Location): 10/20/2023  [facility] and [place] Number:  Producer, television/film/video and Address:  [facility] [place], [address], [place], [place] [address]      Provider Number: [id number]  Attending Physician Name and Address:  Cathren Harsh, MD  Relative Name and Phone Number:       Current Level of Care: Hospital Recommended Level of Care: Skilled Nursing Facility Prior Approval Number:    Date Approved/Denied:   PASRR Number: [id number]  Discharge Plan: SNF    Current Diagnoses: Patient Active Problem List   Diagnosis Date Noted   Acute respiratory failure with hypoxia (HCC) 10/20/2023   Influenza A with pneumonia 10/20/2023   Altered mental status 10/05/2023   MDD (major depressive disorder), recurrent severe, without psychosis (HCC) 10/05/2023   Hypoglycemia 10/03/2023   SIRS (systemic inflammatory response syndrome) (HCC) 10/03/2023   History of COPD 10/03/2023   History of seizures 10/03/2023   Multiple falls 07/21/2023   Fall 07/15/2023   Acute metabolic encephalopathy 07/14/2023   Obesity, Class III, BMI 40-49.9 (morbid obesity) (HCC) 07/14/2023   History of CVA with residual deficit 07/14/2023   Fall at home, initial encounter 07/14/2023   Gout 07/12/2023   Constipation 07/09/2023   Polyneuropathy 07/04/2023   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   Acute kidney injury superimposed on stage 3a chronic kidney disease (HCC) 07/03/2023   Orthostatic hypotension 07/03/2023   Stage 3a chronic kidney disease (HCC) 06/30/2023   Acute stroke due to occlusion of left cerebellar artery (HCC) 06/28/2023   Type 2 diabetes mellitus with diabetic polyneuropathy, with long-term current use of insulin (HCC) 06/28/2023   CVA (cerebral vascular accident) (HCC) 06/25/2023   Atrial  fibrillation, chronic (HCC) 06/25/2023   Acute CVA (cerebrovascular accident) (HCC) 06/25/2023   S/P TAVR (transcatheter aortic valve replacement) 02/07/2023   Acute respiratory failure with hypoxia and hypercapnia (HCC) 01/23/2023   Nail, injury by, initial encounter 12/09/2022   Heart failure (HCC) 10/27/2022   Gout attack 10/27/2022   CHF (congestive heart failure) (HCC) 10/26/2022   Right sided weakness 05/26/2022   Dysarthria 05/25/2022   Ischemic stroke (HCC) 02/25/2022   Hemiparesis affecting left side as late effect of cerebrovascular accident (CVA) (HCC) 02/25/2022   Durable power of attorney for healthcare exists  02/25/2022   Depression 08/07/2021   Paronychia of great toe, left 07/07/2021   Stroke-like symptoms 04/27/2021   Ambulatory dysfunction 04/27/2021   Left-sided weakness 02/09/2021   Atrial fibrillation with rapid ventricular response (HCC) 02/08/2021   AF (paroxysmal atrial fibrillation) (HCC) 01/25/2021   COPD (chronic obstructive pulmonary disease) (HCC) 01/25/2021   Aortic stenosis 01/21/2021   Acquired dilation of ascending aorta and aortic root (HCC) 01/21/2021   Coronary artery disease due to lipid rich plaque    SVT (supraventricular tachycardia) (HCC)    Seizure disorder (HCC)    Primary hypertension 10/17/2020   Hypokalemia 10/17/2020   Cellulitis of right leg 12/04/2019   Pain due to onychomycosis of toenails of both feet 09/11/2019   Diabetic neuropathy (HCC) 09/11/2019   DM2 (diabetes mellitus, type 2) (HCC) 09/11/2019   Sinus tachycardia 03/07/2019   Chronic venous stasis 03/07/2019   History of anemia 12/26/2018   Anemia 08/24/2018   History of colon polyps 08/24/2018   Do not resuscitate status 08/24/2018   Chronic diastolic CHF (congestive heart  failure) (HCC) 08/18/2018   Coronary artery calcification seen on CAT scan    Pure hypercholesterolemia    Chest pain    Shortness of breath    Right foot pain 01/13/2018   Acute on chronic  diastolic CHF (congestive heart failure) (HCC) 01/13/2018   Type 2 diabetes mellitus with obesity (HCC) 01/13/2018   Lower extremity cellulitis 11/07/2017   Primary osteoarthritis, left shoulder 03/05/2017   BPH without obstruction/lower urinary tract symptoms 02/22/2017   Hematuria 02/22/2017   Peripheral neuropathy 02/22/2017   Physical deconditioning 02/22/2017   Class 3 obesity    PTSD (post-traumatic stress disorder)    Anasarca 01/31/2017   Bilateral lower extremity edema 01/31/2017   OSA (obstructive sleep apnea)     Orientation RESPIRATION BLADDER Height & Weight     Self, Time, Situation, Place  Normal Continent, External catheter Weight: (!) 336 lb 3.2 oz (152.5 kg) Height:  6\' 2"  (188 cm)  BEHAVIORAL SYMPTOMS/MOOD NEUROLOGICAL BOWEL NUTRITION STATUS      Continent Diet (See dc summary)  AMBULATORY STATUS COMMUNICATION OF NEEDS Skin   Extensive Assist Verbally Normal                       Personal Care Assistance Level of Assistance  Bathing, Feeding, Dressing Bathing Assistance: Maximum assistance Feeding assistance: Limited assistance Dressing Assistance: Maximum assistance     Functional Limitations Info  Sight, Hearing, Speech Sight Info: Adequate Hearing Info: Adequate Speech Info: Adequate    SPECIAL CARE FACTORS FREQUENCY  PT (By licensed PT), OT (By licensed OT)     PT Frequency: 5x/ week OT Frequency: 5x/ week            Contractures Contractures Info: Not present    Additional Factors Info  Code Status, Allergies, Psychotropic, Insulin Sliding Scale Code Status Info: DNR limited Allergies Info: Vancomycin,Niacin And Related,Tubersol (tuberculin, Ppd),Doxycycline Psychotropic Info: ARIPiprazole (ABILIFY) tablet 5 mg daily at bedtime,busPIRone (BUSPAR) tablet 10 mg 3 times daily,mirtazapine (REMERON) tablet 15 mg daily at bedtime,pregabalin (LYRICA) capsule 150 mg 3 times daily, venlafaxine XR (EFFEXOR-XR) 24 hr capsule 150 mg daily with  breakfast, levETIRAcetam (KEPPRA) tablet 500 mg 2 times daily Insulin Sliding Scale Info: insulin aspart (novoLOG) injection 0-6 Units 3 times daily with meals       Current Medications (10/24/2023):  This is the current hospital active medication list Current Facility-Administered Medications  Medication Dose Route Frequency Provider Last Rate Last Admin   acetaminophen (TYLENOL) tablet 650 mg  650 mg Oral Q6H PRN Rai, Ripudeep K, MD   650 mg at 10/23/23 1753   Or   acetaminophen (TYLENOL) suppository 650 mg  650 mg Rectal Q6H PRN Rai, Ripudeep K, MD       allopurinol (ZYLOPRIM) tablet 100 mg  100 mg Oral Daily Rai, Ripudeep K, MD   100 mg at 10/24/23 5784   apixaban (ELIQUIS) tablet 5 mg  5 mg Oral BID Rai, Ripudeep K, MD   5 mg at 10/24/23 6962   arformoterol (BROVANA) nebulizer solution 15 mcg  15 mcg Nebulization BID Rai, Ripudeep K, MD   15 mcg at 10/24/23 9528   ARIPiprazole (ABILIFY) tablet 5 mg  5 mg Oral QHS Rai, Ripudeep K, MD   5 mg at 10/23/23 2001   atorvastatin (LIPITOR) tablet 40 mg  40 mg Oral QHS Rai, Ripudeep K, MD   40 mg at 10/23/23 2001   benzonatate (TESSALON) capsule 200 mg  200 mg Oral TID Rai, Ripudeep  K, MD   200 mg at 10/24/23 7425   bisacodyl (DULCOLAX) EC tablet 10 mg  10 mg Oral Daily PRN Rai, Ripudeep K, MD       budesonide (PULMICORT) nebulizer solution 0.25 mg  0.25 mg Nebulization BID Rai, Ripudeep K, MD   0.25 mg at 10/24/23 0909   busPIRone (BUSPAR) tablet 10 mg  10 mg Oral TID Rai, Delene Ruffini, MD   10 mg at 10/24/23 9563   colchicine tablet 0.6 mg  0.6 mg Oral Daily Rai, Ripudeep K, MD   0.6 mg at 10/24/23 8756   cyanocobalamin (VITAMIN B12) tablet 1,000 mcg  1,000 mcg Oral Daily Rai, Ripudeep K, MD   1,000 mcg at 10/24/23 4332   fenofibrate tablet 160 mg  160 mg Oral Daily Rai, Ripudeep K, MD   160 mg at 10/24/23 9518   ferrous sulfate tablet 325 mg  325 mg Oral Daily Rai, Ripudeep K, MD   325 mg at 10/24/23 0927   fluticasone (FLONASE) 50 MCG/ACT nasal  spray 2 spray  2 spray Each Nare Daily Rai, Ripudeep K, MD   2 spray at 10/24/23 0929   guaiFENesin-dextromethorphan (ROBITUSSIN DM) 100-10 MG/5ML syrup 5 mL  5 mL Oral Q4H PRN Rai, Ripudeep K, MD       HYDROcodone-acetaminophen (NORCO/VICODIN) 5-325 MG per tablet 1 tablet  1 tablet Oral Q6H PRN Rai, Ripudeep K, MD   1 tablet at 10/24/23 1005   icosapent Ethyl (VASCEPA) 1 g capsule 2 g  2 g Oral BID Rai, Ripudeep K, MD   2 g at 10/24/23 0930   insulin aspart (novoLOG) injection 0-15 Units  0-15 Units Subcutaneous TID WC Rai, Ripudeep K, MD   5 Units at 10/24/23 1153   insulin aspart (novoLOG) injection 0-5 Units  0-5 Units Subcutaneous QHS Rai, Ripudeep K, MD   2 Units at 10/23/23 2232   insulin aspart (novoLOG) injection 5 Units  5 Units Subcutaneous TID WC Rai, Ripudeep K, MD   5 Units at 10/24/23 1154   insulin glargine (LANTUS) injection 10 Units  10 Units Subcutaneous Daily Rai, Ripudeep K, MD   10 Units at 10/24/23 0947   ipratropium-albuterol (DUONEB) 0.5-2.5 (3) MG/3ML nebulizer solution 3 mL  3 mL Nebulization Q4H PRN Rai, Ripudeep K, MD       lacosamide (VIMPAT) tablet 150 mg  150 mg Oral BID Rai, Ripudeep K, MD   150 mg at 10/24/23 0928   levETIRAcetam (KEPPRA) tablet 500 mg  500 mg Oral BID Rai, Ripudeep K, MD   500 mg at 10/24/23 8416   methocarbamol (ROBAXIN) tablet 500 mg  500 mg Oral Q8H PRN Rai, Ripudeep K, MD   500 mg at 10/23/23 1754   metoprolol succinate (TOPROL-XL) 24 hr tablet 25 mg  25 mg Oral Daily Rai, Ripudeep K, MD   25 mg at 10/24/23 0928   mirtazapine (REMERON) tablet 15 mg  15 mg Oral QHS Rai, Ripudeep K, MD   15 mg at 10/23/23 2001   oseltamivir (TAMIFLU) capsule 75 mg  75 mg Oral BID Rai, Ripudeep K, MD   75 mg at 10/24/23 0930   pantoprazole (PROTONIX) EC tablet 40 mg  40 mg Oral Daily Rai, Ripudeep K, MD   40 mg at 10/24/23 0927   polyethylene glycol (MIRALAX / GLYCOLAX) packet 17 g  17 g Oral Daily Rai, Ripudeep K, MD   17 g at 10/21/23 0913   prazosin (MINIPRESS)  capsule 2 mg  2 mg  Oral QHS Rai, Ripudeep K, MD   2 mg at 10/23/23 2002   predniSONE (DELTASONE) tablet 40 mg  40 mg Oral QAC breakfast Rai, Ripudeep K, MD   40 mg at 10/24/23 0524   pregabalin (LYRICA) capsule 150 mg  150 mg Oral TID Rai, Delene Ruffini, MD   150 mg at 10/24/23 9147   repaglinide (PRANDIN) tablet 2 mg  2 mg Oral BID Rai, Ripudeep K, MD   2 mg at 10/24/23 8295   revefenacin (YUPELRI) nebulizer solution 175 mcg  175 mcg Nebulization Daily Rai, Ripudeep K, MD       rOPINIRole (REQUIP) tablet 0.25 mg  0.25 mg Oral BID Rai, Ripudeep K, MD   0.25 mg at 10/24/23 0930   senna-docusate (Senokot-S) tablet 2 tablet  2 tablet Oral BID Rai, Ripudeep K, MD   2 tablet at 10/21/23 0915   sodium chloride flush (NS) 0.9 % injection 3 mL  3 mL Intravenous Q12H Rai, Ripudeep K, MD   3 mL at 10/24/23 0932   sodium chloride flush (NS) 0.9 % injection 3 mL  3 mL Intravenous PRN Rai, Ripudeep K, MD       spironolactone (ALDACTONE) tablet 12.5 mg  12.5 mg Oral Daily Yvonna Alanis L, PA-C   12.5 mg at 10/24/23 6213   torsemide (DEMADEX) tablet 60 mg  60 mg Oral BID Rai, Ripudeep K, MD   60 mg at 10/24/23 0758   traZODone (DESYREL) tablet 200 mg  200 mg Oral QHS Rai, Ripudeep K, MD   200 mg at 10/23/23 2002   venlafaxine XR (EFFEXOR-XR) 24 hr capsule 150 mg  150 mg Oral Q breakfast Rai, Ripudeep K, MD   150 mg at 10/24/23 0800     Discharge Medications: Please see discharge summary for a list of discharge medications.  Relevant Imaging Results:  Relevant Lab Results:   Additional Information [id number]  Michaela Corner, [place]

## 2023-10-24 NOTE — Plan of Care (Signed)
   Problem: Coping: Goal: Ability to adjust to condition or change in health will improve Outcome: Progressing   Problem: Fluid Volume: Goal: Ability to maintain a balanced intake and output will improve Outcome: Progressing

## 2023-10-25 DIAGNOSIS — I69811 Memory deficit following other cerebrovascular disease: Secondary | ICD-10-CM | POA: Diagnosis not present

## 2023-10-25 DIAGNOSIS — I5030 Unspecified diastolic (congestive) heart failure: Secondary | ICD-10-CM | POA: Diagnosis not present

## 2023-10-25 DIAGNOSIS — I5032 Chronic diastolic (congestive) heart failure: Secondary | ICD-10-CM | POA: Diagnosis not present

## 2023-10-25 DIAGNOSIS — E119 Type 2 diabetes mellitus without complications: Secondary | ICD-10-CM | POA: Diagnosis not present

## 2023-10-25 DIAGNOSIS — I480 Paroxysmal atrial fibrillation: Secondary | ICD-10-CM | POA: Diagnosis not present

## 2023-10-25 DIAGNOSIS — I5033 Acute on chronic diastolic (congestive) heart failure: Secondary | ICD-10-CM | POA: Diagnosis not present

## 2023-10-25 DIAGNOSIS — M6281 Muscle weakness (generalized): Secondary | ICD-10-CM | POA: Diagnosis not present

## 2023-10-25 DIAGNOSIS — I63542 Cerebral infarction due to unspecified occlusion or stenosis of left cerebellar artery: Secondary | ICD-10-CM | POA: Diagnosis not present

## 2023-10-25 DIAGNOSIS — E1142 Type 2 diabetes mellitus with diabetic polyneuropathy: Secondary | ICD-10-CM

## 2023-10-25 DIAGNOSIS — J101 Influenza due to other identified influenza virus with other respiratory manifestations: Secondary | ICD-10-CM | POA: Diagnosis not present

## 2023-10-25 DIAGNOSIS — E1140 Type 2 diabetes mellitus with diabetic neuropathy, unspecified: Secondary | ICD-10-CM | POA: Diagnosis not present

## 2023-10-25 DIAGNOSIS — Z794 Long term (current) use of insulin: Secondary | ICD-10-CM

## 2023-10-25 DIAGNOSIS — R262 Difficulty in walking, not elsewhere classified: Secondary | ICD-10-CM | POA: Diagnosis not present

## 2023-10-25 DIAGNOSIS — Z8673 Personal history of transient ischemic attack (TIA), and cerebral infarction without residual deficits: Secondary | ICD-10-CM | POA: Diagnosis not present

## 2023-10-25 DIAGNOSIS — G8194 Hemiplegia, unspecified affecting left nondominant side: Secondary | ICD-10-CM | POA: Diagnosis not present

## 2023-10-25 DIAGNOSIS — I69828 Other speech and language deficits following other cerebrovascular disease: Secondary | ICD-10-CM | POA: Diagnosis not present

## 2023-10-25 DIAGNOSIS — I4891 Unspecified atrial fibrillation: Secondary | ICD-10-CM | POA: Diagnosis not present

## 2023-10-25 DIAGNOSIS — J9601 Acute respiratory failure with hypoxia: Secondary | ICD-10-CM | POA: Diagnosis not present

## 2023-10-25 DIAGNOSIS — J441 Chronic obstructive pulmonary disease with (acute) exacerbation: Secondary | ICD-10-CM | POA: Diagnosis not present

## 2023-10-25 DIAGNOSIS — G8929 Other chronic pain: Secondary | ICD-10-CM | POA: Diagnosis not present

## 2023-10-25 DIAGNOSIS — R531 Weakness: Secondary | ICD-10-CM | POA: Diagnosis not present

## 2023-10-25 DIAGNOSIS — J189 Pneumonia, unspecified organism: Secondary | ICD-10-CM | POA: Diagnosis not present

## 2023-10-25 DIAGNOSIS — R6520 Severe sepsis without septic shock: Secondary | ICD-10-CM | POA: Diagnosis not present

## 2023-10-25 DIAGNOSIS — J449 Chronic obstructive pulmonary disease, unspecified: Secondary | ICD-10-CM | POA: Diagnosis not present

## 2023-10-25 LAB — RENAL FUNCTION PANEL
Albumin: 3.2 g/dL — ABNORMAL LOW (ref 3.5–5.0)
Anion gap: 7 (ref 5–15)
BUN: 32 mg/dL — ABNORMAL HIGH (ref 8–23)
CO2: 33 mmol/L — ABNORMAL HIGH (ref 22–32)
Calcium: 8.5 mg/dL — ABNORMAL LOW (ref 8.9–10.3)
Chloride: 99 mmol/L (ref 98–111)
Creatinine, Ser: 1.3 mg/dL — ABNORMAL HIGH (ref 0.61–1.24)
GFR, Estimated: >60 mL/min (ref >60)
Glucose, Bld: 191 mg/dL — ABNORMAL HIGH (ref 70–99)
Phosphorus: 3.2 mg/dL (ref 2.5–4.6)
Potassium: 3.8 mmol/L (ref 3.5–5.1)
Sodium: 139 mmol/L (ref 135–145)

## 2023-10-25 LAB — CULTURE, BLOOD (ROUTINE X 2)
Culture: NO GROWTH
Special Requests: ADEQUATE

## 2023-10-25 LAB — GLUCOSE, CAPILLARY
Glucose-Capillary: 162 mg/dL — ABNORMAL HIGH (ref 70–99)
Glucose-Capillary: 266 mg/dL — ABNORMAL HIGH (ref 70–99)

## 2023-10-25 MED ORDER — GUAIFENESIN-DM 100-10 MG/5ML PO SYRP: 5.0000 mL | ORAL_SOLUTION | ORAL | Status: DC | PRN

## 2023-10-25 MED ORDER — HYDROCODONE-ACETAMINOPHEN 5-325 MG PO TABS: 1.0000 | ORAL_TABLET | Freq: Four times a day (QID) | ORAL | 0 refills | Status: DC | PRN

## 2023-10-25 MED ORDER — METOPROLOL SUCCINATE ER 25 MG PO TB24
25.0000 mg | ORAL_TABLET | Freq: Every day | ORAL | Status: DC
Start: 2023-10-25 — End: 2023-12-08

## 2023-10-25 MED ORDER — INSULIN ASPART 100 UNIT/ML IJ SOLN: 0.0000 [IU] | Freq: Three times a day (TID) | INTRAMUSCULAR | Status: DC

## 2023-10-25 MED ORDER — PREDNISONE 20 MG PO TABS
40.0000 mg | ORAL_TABLET | Freq: Every day | ORAL | Status: AC
Start: 2023-10-26 — End: 2023-10-28

## 2023-10-25 NOTE — Progress Notes (Signed)
 Repot called to [facility] LPN at [facility].

## 2023-10-25 NOTE — Discharge Summary (Signed)
 Physician Discharge Summary   Patient: Lawrence [name] MRN: [id number] DOB: [birth year]  Admit date:     10/20/2023  Discharge date: 10/25/23  Discharge Physician: Thad Ranger, MD    PCP: [name], PA-C   Recommendations at discharge:   Continue prednisone 40 g p.o. daily for 2 more days then off Patient is currently placed on sliding scale insulin while on prednisone.  He may not need further insulin once the steroid induced hyperglycemia is improved. Patient has completed the course of Tamiflu and antibiotics x 5 days, does not need any further droplet precautions.   Discharge Diagnoses:    Acute respiratory failure with hypoxia (HCC) Influenza A with pneumonia   Hypokalemia   Acute kidney injury superimposed on stage 3a chronic kidney disease (HCC)   Class 3 obesity   OSA (obstructive sleep apnea)   Pure hypercholesterolemia   Primary hypertension   Seizure disorder (HCC)   COPD (chronic obstructive pulmonary disease) (HCC)   S/P TAVR (transcatheter aortic valve replacement)   CVA (cerebral vascular accident) (HCC)   Atrial fibrillation, chronic (HCC)   Type 2 diabetes mellitus with diabetic polyneuropathy, with long-term current use of insulin (HCC)   Obesity, Class III, BMI 40-49.9 (morbid obesity) [facility])    Hospital Course:  Patient is a 68 y.o. male with PMH of COPD, severe aortic stenosis status post TAVR in July 2024, seizure disorder, chronic diastolic CHF (follows Dr. Shirlee Latch), atrial fibrillation on Eliquis, gout, anxiety, BPH, chronic venous stasis, morbid obesity presented from SNF with worsening shortness of breath in the last 2 days.  Patient is somewhat poor historian, reports that he was having subjective fevers and chills, shortness of breath, sleeps on the hospital bed sitting upright, felt congested.  He was sent to the ED and was noted to be hypoxic with O2 sats 88% by the EMS.  Per patient, at baseline he does not use any O2. No nausea vomiting,  chest pain or any diarrhea.  In ED, patient was noted to have temp of 102.2 F, RR 24, heart rate 98-103, BP 153/71, O2 sats 96% on 5 L O2 via [place]  Lactic acid 2.0, BNP 235 Respiratory virus panel showed influenza A positive.  RSV, COVID-negative. Chest x-ray showed new bandlike opacity in the right lower lung may reflect atelectasis and/or airspace disease.   Assessment and Plan:   Sepsis with Acute respiratory failure with hypoxia (HCC)   Influenza A with pneumonia -Met SIRS criteria on admission, presented with fevers, congestion, shortness of breath, new hypoxia on 5 L O2 via [place], not on O2 at baseline, lactic acidosis, AKI.  Chest x-ray with new bandlike opacity in the right lower lung atelectasis versus infiltrates. --Urine strep antigen negative  -BC ID Staph species+, blood cultures showed 1 out of 2 Staph warneri, likely contaminant -Patient has completed Tamiflu, IV Rocephin and Zithromax for 5 days. -Weaned off O2.   COPD with acute exacerbation -Noted diffuse wheezing today bilaterally, possibly acute COPD exacerbation with pulmonary edema -Resumed torsemide, placed on scheduled DuoNebs, Pulmicort, Brovana -Continue flutter valve, I-S -Wheezing improved.  He was placed on IV steroids however caused significant hyperglycemia hence was transitioned to oral prednisone, 40 mg daily, continue for 2 more days to complete 5 days.      Hypokalemia -Replace as needed     Acute kidney injury superimposed on stage 3a chronic kidney disease (HCC) -Baseline creatinine 1.1, creatinine 1.3 on admission, lactic acidosis, received IV fluid bolus in ED -Creatinine stable at  baseline, continue torsemide, spironolactone      Diabetes mellitus type 2, with diabetic polyneuropathy -Hemoglobin A1c 6.6 on 10/04/2023 -Outpatient on prandin, Mounjaro weekly -Patient was placed on IV Solu-Medrol and caused significant hyperglycemia hence placed on Lantus, sliding scale insulin -Patient has been  transitioned to oral prednisone, CBGs are improving.  Continue moderate sliding scale insulin while on prednisone.  He may not need any further insulin once off the steroids.      Pure hypercholesterolemia -Continue statin, fenofibrate     Primary hypertension -BP stable, continue Toprol-XL, spironolactone, torsemide    Chronic diastolic CHF -2D echo 03/10/2023 had shown EF of 55 to 60%, G1 DD, normal right ventricular systolic function, TAVR+ -Cardiology was consulted, recommended to resume torsemide, Aldactone.  Continue metolazone weekly -Negative balance of 18.7 L - weight 348.33lbs on admission, 330.6 at discharge     Seizure disorder (HCC) -Continue Keppra, lacosamide       S/P TAVR (transcatheter aortic valve replacement) -No acute issues   Prior history of CVA (cerebral vascular accident) (HCC) -Continue statin, eliquis     Atrial fibrillation, chronic (HCC) -Heart rate controlled, resumed Toprol-XL -Continue eliquis   Anxiety, depression -Patient is on several psych medications, will resume -Continue trazodone, mirtazapine, BuSpar, Effexor, Abilify    Obesity class III BMI 40-49.9 (morbid obesity) (HCC) Estimated body mass index is 43.17 kg/m as calculated from the following:   Height as of this encounter: 6\' 2"  (1.88 m).   Weight as of this encounter: 152.5 kg. -Continue Mounjaro outpatient       Pain control - [facility] Controlled Substance Reporting System database was reviewed. and patient was instructed, not to drive, operate heavy machinery, perform activities at heights, swimming or participation in water activities or provide baby-sitting services while on Pain, Sleep and Anxiety Medications; until their outpatient Physician has advised to do so again. Also recommended to not to take more than prescribed Pain, Sleep and Anxiety Medications.  Consultants: Cardiology Procedures performed:   Disposition: Skilled nursing facility Diet recommendation:   Discharge Diet Orders (From admission, onward)     Start     Ordered   10/25/23 0000  Diet Carb Modified        10/25/23 1101            DISCHARGE MEDICATION: Allergies as of 10/25/2023       Reactions   Niacin And Related Other (See Comments)   Redness, flushing   Ppd [tuberculin Purified Protein Derivative]    Unknown rxn (MAR)   Tubersol [tuberculin, Ppd] Other (See Comments)   Unknown rxn (MAR)   Firvanq [vancomycin] Other (See Comments)   Red man syndrome   Vibramycin [doxycycline] Rash        Medication List     TAKE these medications    acetaminophen 650 MG CR tablet Commonly known as: TYLENOL Take 650 mg by mouth every 8 (eight) hours as needed for pain.   albuterol 108 (90 Base) MCG/ACT inhaler Commonly known as: VENTOLIN HFA Inhale 2 puffs into the lungs every 6 (six) hours as needed for wheezing or shortness of breath.   allopurinol 100 MG tablet Commonly known as: Zyloprim Take 1 tablet (100 mg total) by mouth daily.   apixaban 5 MG Tabs tablet Commonly known as: Eliquis Take 1 tablet (5 mg total) by mouth 2 (two) times daily.   ARIPiprazole 5 MG tablet Commonly known as: ABILIFY TAKE 1 TABLET BY MOUTH EVERYDAY AT BEDTIME   atorvastatin 40 MG  tablet Commonly known as: LIPITOR Take 40 mg by mouth at bedtime.   bisacodyl 5 MG EC tablet Commonly known as: DULCOLAX Take 10 mg by mouth daily as needed for moderate constipation.   busPIRone 10 MG tablet Commonly known as: BUSPAR Take 1 tablet (10 mg total) by mouth 3 (three) times daily.   colchicine 0.6 MG tablet Take 1 tablet (0.6 mg total) by mouth daily.   cyanocobalamin 1000 MCG tablet Commonly known as: VITAMIN B12 Take 1,000 mcg by mouth daily.   fenofibrate 145 MG tablet Commonly known as: Tricor Take 1 tablet (145 mg total) by mouth daily.   ferrous sulfate 325 (65 FE) MG tablet Take 325 mg by mouth daily.   fluticasone 50 MCG/ACT nasal spray Commonly known as:  FLONASE Place 2 sprays into both nostrils daily.   guaiFENesin-dextromethorphan 100-10 MG/5ML syrup Commonly known as: ROBITUSSIN DM Take 5 mLs by mouth every 4 (four) hours as needed for cough.   HYDROcodone-acetaminophen 5-325 MG tablet Commonly known as: NORCO/VICODIN Take 1 tablet by mouth every 6 (six) hours as needed for moderate pain (pain score 4-6) or severe pain (pain score 7-10). What changed: when to take this   insulin aspart 100 UNIT/ML injection Commonly known as: novoLOG Inject 0-15 Units into the skin 3 (three) times daily with meals. Sliding scale  CBG 70 - 120: 0 units: CBG 121 - 150: 2 units; CBG 151 - 200: 3 units; CBG 201 - 250: 5 units; CBG 251 - 300: 8 units;CBG 301 - 350: 11 units; CBG 351 - 400: 15 units; CBG > 400 : 15 units and notify MD   Lacosamide 150 MG Tabs Take 1 tablet (150 mg total) by mouth 2 (two) times daily.   levETIRAcetam 500 MG tablet Commonly known as: KEPPRA Take 1 tablet (500 mg total) by mouth 2 (two) times daily.   methocarbamol 500 MG tablet Commonly known as: ROBAXIN Take 500 mg by mouth every 8 (eight) hours as needed for muscle spasms.   metolazone 2.5 MG tablet Commonly known as: ZAROXOLYN Take 2.5 mg by mouth once a week. Every Tuesday.   metoprolol succinate 25 MG 24 hr tablet Commonly known as: TOPROL-XL Take 1 tablet (25 mg total) by mouth daily. Take with or immediately following a meal. What changed:  medication strength how much to take   mirtazapine 15 MG tablet Commonly known as: REMERON Take 15 mg by mouth at bedtime.   Mounjaro 15 MG/0.5ML Pen Generic drug: tirzepatide INJECT 15 MG INTO THE SKIN ONCE A WEEK. What changed: See the new instructions.   mupirocin ointment 2 % Commonly known as: BACTROBAN Apply 1 Application topically 2 (two) times daily.   Muscle Rub 10-15 % Crea Apply 1 Application topically as needed for muscle pain. Apply 4gm to knees.   omeprazole 20 MG capsule Commonly known as:  PRILOSEC Take 20 mg by mouth daily.   polyethylene glycol powder 17 GM/SCOOP powder Commonly known as: GLYCOLAX/MIRALAX Mix in water as directed and take 1 capful (17 g) by mouth daily.   potassium chloride SA 20 MEQ tablet Commonly known as: Klor-Con M20 Take 1-2 tablets (20-40 mEq total) by mouth See admin instructions. Take 2 tablets in the morning and 1 tablet in the evening What changed: additional instructions   prazosin 2 MG capsule Commonly known as: MINIPRESS Take 2 mg by mouth at bedtime.   predniSONE 20 MG tablet Commonly known as: DELTASONE Take 2 tablets (40 mg total) by mouth daily  with breakfast for 2 days. Start taking on: October 26, 2023   pregabalin 150 MG capsule Commonly known as: LYRICA Take 1 capsule (150 mg total) by mouth 3 (three) times daily.   repaglinide 2 MG tablet Commonly known as: PRANDIN Take 2 mg by mouth 2 (two) times daily.   rOPINIRole 0.25 MG tablet Commonly known as: REQUIP Take 1 tablet (0.25 mg total) by mouth 2 (two) times daily.   sennosides-docusate sodium 8.6-50 MG tablet Commonly known as: SENOKOT-S Take 2 tablets by mouth in the morning and at bedtime.   Spiriva Respimat 2.5 MCG/ACT Aers Generic drug: Tiotropium Bromide Monohydrate Inhale 2 puffs into the lungs daily.   spironolactone 25 MG tablet Commonly known as: ALDACTONE Take 12.5 mg by mouth daily.   torsemide 20 MG tablet Commonly known as: DEMADEX Take 3 tablets (60 mg total) by mouth 2 (two) times daily.   traZODone 100 MG tablet Commonly known as: DESYREL Take 200 mg by mouth at bedtime.   Trelegy Ellipta 200-62.5-25 MCG/ACT Aepb Generic drug: Fluticasone-Umeclidin-Vilant INHALE 1 PUFF BY MOUTH INTO LUNGS ONCE DAILY   Vascepa 1 g capsule Generic drug: icosapent Ethyl TAKE 2 CAPSULES BY MOUTH TWICE A DAY   venlafaxine XR 150 MG 24 hr capsule Commonly known as: EFFEXOR-XR Take 150 mg by mouth daily with breakfast.        Follow-up Information      [name], PA-C. Schedule an appointment as soon as possible for a visit in 2 week(s).   Specialty: Family Medicine Why: for hospital follow-up Contact information: [address] [place] [address] [phone]                Discharge Exam: Ceasar Mons Weights   10/23/23 0500 10/24/23 0448 10/25/23 0503  Weight: (!) 153.5 kg (!) 152.5 kg (!) 150 kg   S: No acute complaints, feeling better today, off of O2.  Hoping to discharge today.  BP 126/84 (BP Location: Right Arm)   Pulse 66   Temp (!) 97.5 F (36.4 C) (Oral)   Resp 14   Ht 6\' 2"  (1.88 m)   Wt (!) 150 kg   SpO2 90%   BMI 42.46 kg/m   Physical Exam General: Alert and oriented x 3, NAD Cardiovascular: S1 S2 clear, RRR.  Respiratory: CTAB, no wheezing, rales or rhonchi Gastrointestinal: Soft, nontender, nondistended, NBS, obese Ext: no pedal edema bilaterally Neuro: no new deficits Psych: Normal affect    Condition at discharge: fair  The results of significant diagnostics from this hospitalization (including imaging, microbiology, ancillary and laboratory) are listed below for reference.   Imaging Studies: DG Chest Port 1 View Result Date: 10/20/2023 CLINICAL DATA:  Shortness of breath.  History of COPD, CHF and AFib. EXAM: PORTABLE CHEST 1 VIEW COMPARISON:  10/02/2023 FINDINGS: Stable cardiomediastinal contours. New bandlike opacity within the right lower lung is identified which may reflect atelectasis and or airspace disease. No signs of pleural effusion or interstitial edema. Visualized osseous structures appear grossly intact. IMPRESSION: New bandlike opacity within the right lower lung may reflect atelectasis and/or airspace disease. Electronically Signed   By: Signa Kell M.D.   On: 10/20/2023 07:00   CT Head Wo Contrast Result Date: 10/10/2023 CLINICAL DATA:  Status post fall. EXAM: CT HEAD WITHOUT CONTRAST TECHNIQUE: Contiguous axial images were obtained from the base of the skull  through the vertex without intravenous contrast. RADIATION DOSE REDUCTION: This exam was performed according to the departmental dose-optimization program which includes automated  exposure control, adjustment of the mA and/or kV according to patient size and/or use of iterative reconstruction technique. COMPARISON:  October 02, 2023 FINDINGS: Brain: There is mild cerebral atrophy with widening of the extra-axial spaces and ventricular dilatation. There are areas of decreased attenuation within the white matter tracts of the supratentorial brain, consistent with microvascular disease changes. Vascular: No hyperdense vessel or unexpected calcification. Skull: Normal. Negative for fracture or focal lesion. Sinuses/Orbits: There is moderate severity left-sided sphenoid sinus and bilateral ethmoid sinus mucosal thickening. Mild left maxillary sinus mucosal thickening is also seen with a subcentimeter left maxillary sinus polyp versus mucous retention cyst. Other: None. IMPRESSION: 1. No acute intracranial abnormality. 2. Generalized cerebral atrophy with widening of the extra-axial spaces and ventricular dilatation. 3. Moderate severity left-sided sphenoid sinus and bilateral ethmoid sinus disease. Electronically Signed   By: Aram Candela M.D.   On: 10/10/2023 21:32   MR LUMBAR SPINE WO CONTRAST Result Date: 10/09/2023 CLINICAL DATA:  Spinal stenosis, cervical; Lumbar radiculopathy, no red flags, no prior management. EXAM: MRI CERVICAL AND LUMBAR SPINE WITHOUT CONTRAST TECHNIQUE: Multiplanar and multiecho pulse sequences of the cervical spine, to include the craniocervical junction and cervicothoracic junction, and lumbar spine, were obtained without intravenous contrast. COMPARISON:  None Available. FINDINGS: MRI CERVICAL SPINE FINDINGS Alignment: No substantial sagittal subluxation. Vertebrae: No fracture, evidence of discitis, or bone lesion. Cord: Normal cord signal. Posterior Fossa, vertebral arteries,  paraspinal tissues: Negative. Disc levels: C2-C3: Left facet and uncovertebral hypertrophy with moderate to severe left foraminal stenosis. Patent canal and right foramen. C3-C4: Posterior disc osteophyte complex and ligamentum flavum thickening. Bilateral facet and uncovertebral hypertrophy. Resulting severe canal stenosis and severe right greater than left foraminal stenosis. C4-C5: Right greater than left facet and uncovertebral hypertrophy. Resulting severe right and moderate left foraminal stenosis. Patent canal. C5-C6: Posterior disc osteophyte complex. Bilateral facet uncovertebral hypertrophy. Severe bilateral foraminal stenosis. Mild canal stenosis. C6-C7: Left greater than right facet and uncovertebral hypertrophy. Resulting moderate to severe bilateral foraminal stenosis. Patent canal. C7-T1: Bilateral uncovertebral hypertrophy. Resulting mild bilateral foraminal stenosis. Patent canal. MRI LUMBAR SPINE FINDINGS Motion limited study. Also, incomplete study without axial T1. Within this limitation: Segmentation: Standard. Alignment:  No substantial sagittal subluxation. Vertebrae: Mild edema along the anterior/superior L3 vertebral body is likely degenerative in etiology. No specific evidence of acute fracture or discitis/osteomyelitis. Conus medullaris and cauda equina: Conus extends to the L1 level. Conus appears normal. Paraspinal and other soft tissues: Unremarkable. Disc levels: T12-L1: No significant disc protrusion, foraminal stenosis, or canal stenosis. L1-L2: No significant disc protrusion, foraminal stenosis, or canal stenosis. L2-L3: Mild disc bulge.  No significant stenosis. L3-L4: No significant disc protrusion, foraminal stenosis, or canal stenosis. L4-L5: Small central disc protrusion. No significant canal or foraminal stenosis. L5-S1: Mild broad disc bulging. Bilateral facet arthropathy. Moderate right and mild left foraminal stenosis. Patent canal. IMPRESSION: MRI CERVICAL SPINE: 1. Severe  canal stenosis at C3-C4. 2. Severe foraminal stenosis on the right at C4-C5 and bilaterally at C3-C4 and C5-C6. 3. Moderate to severe foraminal stenosis on the left at C2-C3 and bilaterally at C6-C7. MRI LUMBAR SPINE: Moderate right and mild left foraminal stenosis at L5-S1. Electronically Signed   By: Feliberto Harts M.D.   On: 10/09/2023 23:25   MR CERVICAL SPINE WO CONTRAST Result Date: 10/09/2023 CLINICAL DATA:  Spinal stenosis, cervical; Lumbar radiculopathy, no red flags, no prior management. EXAM: MRI CERVICAL AND LUMBAR SPINE WITHOUT CONTRAST TECHNIQUE: Multiplanar and multiecho pulse sequences of the cervical spine,  to include the craniocervical junction and cervicothoracic junction, and lumbar spine, were obtained without intravenous contrast. COMPARISON:  None Available. FINDINGS: MRI CERVICAL SPINE FINDINGS Alignment: No substantial sagittal subluxation. Vertebrae: No fracture, evidence of discitis, or bone lesion. Cord: Normal cord signal. Posterior Fossa, vertebral arteries, paraspinal tissues: Negative. Disc levels: C2-C3: Left facet and uncovertebral hypertrophy with moderate to severe left foraminal stenosis. Patent canal and right foramen. C3-C4: Posterior disc osteophyte complex and ligamentum flavum thickening. Bilateral facet and uncovertebral hypertrophy. Resulting severe canal stenosis and severe right greater than left foraminal stenosis. C4-C5: Right greater than left facet and uncovertebral hypertrophy. Resulting severe right and moderate left foraminal stenosis. Patent canal. C5-C6: Posterior disc osteophyte complex. Bilateral facet uncovertebral hypertrophy. Severe bilateral foraminal stenosis. Mild canal stenosis. C6-C7: Left greater than right facet and uncovertebral hypertrophy. Resulting moderate to severe bilateral foraminal stenosis. Patent canal. C7-T1: Bilateral uncovertebral hypertrophy. Resulting mild bilateral foraminal stenosis. Patent canal. MRI LUMBAR SPINE FINDINGS  Motion limited study. Also, incomplete study without axial T1. Within this limitation: Segmentation: Standard. Alignment:  No substantial sagittal subluxation. Vertebrae: Mild edema along the anterior/superior L3 vertebral body is likely degenerative in etiology. No specific evidence of acute fracture or discitis/osteomyelitis. Conus medullaris and cauda equina: Conus extends to the L1 level. Conus appears normal. Paraspinal and other soft tissues: Unremarkable. Disc levels: T12-L1: No significant disc protrusion, foraminal stenosis, or canal stenosis. L1-L2: No significant disc protrusion, foraminal stenosis, or canal stenosis. L2-L3: Mild disc bulge.  No significant stenosis. L3-L4: No significant disc protrusion, foraminal stenosis, or canal stenosis. L4-L5: Small central disc protrusion. No significant canal or foraminal stenosis. L5-S1: Mild broad disc bulging. Bilateral facet arthropathy. Moderate right and mild left foraminal stenosis. Patent canal. IMPRESSION: MRI CERVICAL SPINE: 1. Severe canal stenosis at C3-C4. 2. Severe foraminal stenosis on the right at C4-C5 and bilaterally at C3-C4 and C5-C6. 3. Moderate to severe foraminal stenosis on the left at C2-C3 and bilaterally at C6-C7. MRI LUMBAR SPINE: Moderate right and mild left foraminal stenosis at L5-S1. Electronically Signed   By: Feliberto Harts M.D.   On: 10/09/2023 23:25   CT CHEST ABDOMEN PELVIS W CONTRAST Result Date: 10/02/2023 CLINICAL DATA:  Sepsis EXAM: CT CHEST, ABDOMEN, AND PELVIS WITH CONTRAST TECHNIQUE: Multidetector CT imaging of the chest, abdomen and pelvis was performed following the standard protocol during bolus administration of intravenous contrast. RADIATION DOSE REDUCTION: This exam was performed according to the departmental dose-optimization program which includes automated exposure control, adjustment of the mA and/or kV according to patient size and/or use of iterative reconstruction technique. CONTRAST:  75mL OMNIPAQUE  IOHEXOL 350 MG/ML SOLN COMPARISON:  CTA chest abdomen pelvis dated 01/27/2023 FINDINGS: CT CHEST FINDINGS Cardiovascular: The heart is normal in size. No pericardial effusion. Status post TAVR. No evidence of thoracic aortic aneurysm. Atherosclerotic calcifications of the aortic arch. Moderate three-vessel coronary atherosclerosis. Mediastinum/Nodes: No suspicious mediastinal lymphadenopathy. Visualized thyroid is unremarkable. Lungs/Pleura: Mild centrilobular and paraseptal emphysematous changes, upper lung predominant. No suspicious pulmonary nodules. Mild scarring/atelectasis in the bilateral lower lobes. No focal consolidation. No pleural effusion pneumothorax. Musculoskeletal: Degenerative changes of the thoracic spine. CT ABDOMEN PELVIS FINDINGS Hepatobiliary: Mild hepatic steatosis. 4.1 cm inferior right hepatic lobe cyst (series 3/image 60). Gallbladder is unremarkable. No intrahepatic or extrahepatic ductal dilatation. Pancreas: Within normal limits. Spleen: Within normal limits. Adrenals/Urinary Tract: Adrenal glands are within normal limits. Left kidney is within normal limits. Right lower pole renal cysts measuring up to 2.8 cm (series 3/image 30), benign (Bosniak I). No  follow-up is recommended. Bladder is normal limits. Stomach/Bowel: Stomach is within normal limits. No evidence of bowel obstruction. Small duodenal diverticulum. Normal appendix (series 3/image 94). Mild left colonic diverticulosis, without evidence of diverticulitis. Mild to moderate left colonic stool burden. Vascular/Lymphatic: No evidence of abdominal aortic aneurysm. Atherosclerotic calcifications of the abdominal aorta and branch vessels, although vessels remain patent. No suspicious abdominopelvic lymphadenopathy. Reproductive: Prostate is unremarkable. Other: No abdominopelvic ascites. Musculoskeletal: Mild degenerative changes of the lumbar spine. Spinal stimulator at T12-L1. IMPRESSION: No acute cardiopulmonary disease. Mild  left colonic diverticulosis, without evidence of diverticulitis. Additional ancillary findings as above. Aortic Atherosclerosis (ICD10-I70.0) and Emphysema (ICD10-J43.9). Electronically Signed   By: Charline Bills M.D.   On: 10/02/2023 21:32   CT Head Wo Contrast Result Date: 10/02/2023 CLINICAL DATA:  Altered mental status and lethargy. Recent hospitalization for atrial fibrillation, declining strength in mentation since. History of stroke with left-sided weakness. EXAM: CT HEAD WITHOUT CONTRAST TECHNIQUE: Contiguous axial images were obtained from the base of the skull through the vertex without intravenous contrast. RADIATION DOSE REDUCTION: This exam was performed according to the departmental dose-optimization program which includes automated exposure control, adjustment of the mA and/or kV according to patient size and/or use of iterative reconstruction technique. COMPARISON:  MRI brain 07/14/2023.  CT head 07/25/2023 FINDINGS: Brain: No acute intracranial hemorrhage. No CT evidence of acute infarct. Nonspecific hypoattenuation in the periventricular and subcortical white matter favored to reflect chronic microvascular ischemic changes. No edema, mass effect, or midline shift. The basilar cisterns are patent. Ventricles: The ventricles are normal. Vascular: No hyperdense vessel or unexpected calcification. Skull: No acute or aggressive finding. Orbits: Orbits are symmetric. Sinuses: Mucosal thickening in the ethmoid sinuses, right greater than left. Additional mucosal thickening in the sphenoid sinuses with small air-fluid level in the left sphenoid sinus. Mucosal thickening in the left maxillary sinus. Other: Mastoid air cells are clear. IMPRESSION: 1. No acute intracranial process. 2. Mild chronic microvascular ischemic changes. 3. Paranasal sinus disease with small air-fluid level in the left sphenoid sinus. Correlate for acute sinusitis. Electronically Signed   By: Emily Filbert M.D.   On: 10/02/2023  16:30   DG Chest Port 1 View Result Date: 10/02/2023 CLINICAL DATA:  Altered mental status. EXAM: PORTABLE CHEST 1 VIEW COMPARISON:  09/30/2023. FINDINGS: Low lung volume. There is diffuse moderate vascular congestion, which is accentuated by low lung volume. Bilateral lung fields are otherwise clear. Bilateral costophrenic angles are clear. Stable cardio-mediastinal silhouette. No acute osseous abnormalities. The soft tissues are within normal limits. IMPRESSION: *Moderate diffuse pulmonary vascular congestion, accentuated by low lung volume. Electronically Signed   By: Jules Schick M.D.   On: 10/02/2023 14:21   DG Chest 2 View Result Date: 09/30/2023 CLINICAL DATA:  Chest pain EXAM: CHEST - 2 VIEW COMPARISON:  07/21/2023 FINDINGS: Lungs are clear.  No pleural effusion or pneumothorax. The heart is top normal in size. Degenerative changes of the visualized thoracolumbar spine. IMPRESSION: Normal chest radiographs. Electronically Signed   By: Charline Bills M.D.   On: 09/30/2023 19:39    Microbiology: Results for orders placed or performed during the hospital encounter of 10/20/23  Resp panel by RT-PCR (RSV, Flu A&B, Covid) Anterior Nasal Swab     Status: Abnormal   Collection Time: 10/20/23  6:05 AM   Specimen: Anterior Nasal Swab  Result Value Ref Range Status   SARS Coronavirus 2 by RT PCR NEGATIVE NEGATIVE Final   Influenza A by PCR POSITIVE (A) NEGATIVE Final  Influenza B by PCR NEGATIVE NEGATIVE Final    Comment: (NOTE) The Xpert Xpress SARS-CoV-2/FLU/RSV plus assay is intended as an aid in the diagnosis of influenza from Nasopharyngeal swab specimens and should not be used as a sole basis for treatment. Nasal washings and aspirates are unacceptable for Xpert Xpress SARS-CoV-2/FLU/RSV testing.  Fact Sheet for Patients: BloggerCourse.com  Fact Sheet for Healthcare Providers: SeriousBroker.it  This test is not yet approved or  cleared by the [place] FDA and has been authorized for detection and/or diagnosis of SARS-CoV-2 by FDA under an Emergency Use Authorization (EUA). This EUA will remain in effect (meaning this test can be used) for the duration of the COVID-19 declaration under Section 564(b)(1) of the Act, 21 U.S.C. section 360bbb-3(b)(1), unless the authorization is terminated or revoked.     Resp Syncytial Virus by PCR NEGATIVE NEGATIVE Final    Comment: (NOTE) Fact Sheet for Patients: BloggerCourse.com  Fact Sheet for Healthcare Providers: SeriousBroker.it  This test is not yet approved or cleared by the [place] FDA and has been authorized for detection and/or diagnosis of SARS-CoV-2 by FDA under an Emergency Use Authorization (EUA). This EUA will remain in effect (meaning this test can be used) for the duration of the COVID-19 declaration under Section 564(b)(1) of the Act, 21 U.S.C. section 360bbb-3(b)(1), unless the authorization is terminated or revoked.  Performed at [facility], [address] [place], [place] [address]   Blood Culture (routine x 2)     Status: Abnormal   Collection Time: 10/20/23  6:06 AM   Specimen: BLOOD  Result Value Ref Range Status   Specimen Description BLOOD RIGHT ANTECUBITAL  Final   Special Requests   Final    BOTTLES DRAWN AEROBIC AND ANAEROBIC Blood Culture adequate volume   Culture  Setup Time   Final    GRAM POSITIVE COCCI AEROBIC BOTTLE ONLY CRITICAL RESULT CALLED TO, READ BACK BY AND VERIFIED WITH: J [facility]  10/21/23 MK    Culture (A)  Final    STAPHYLOCOCCUS WARNERI THE SIGNIFICANCE OF ISOLATING THIS ORGANISM FROM A SINGLE SET OF BLOOD CULTURES WHEN MULTIPLE SETS ARE DRAWN IS UNCERTAIN. PLEASE NOTIFY THE MICROBIOLOGY DEPARTMENT WITHIN ONE WEEK IF SPECIATION AND SENSITIVITIES ARE REQUIRED. Performed at [facility], [address] [place], [place] [address]     Report Status 10/23/2023 FINAL  Final  Blood Culture ID Panel (Reflexed)     Status: Abnormal   Collection Time: 10/20/23  6:06 AM  Result Value Ref Range Status   Enterococcus faecalis NOT DETECTED NOT DETECTED Final   Enterococcus Faecium NOT DETECTED NOT DETECTED Final   Listeria monocytogenes NOT DETECTED NOT DETECTED Final   Staphylococcus species DETECTED (A) NOT DETECTED Final    Comment: CRITICAL RESULT CALLED TO, READ BACK BY AND VERIFIED WITH: J WYLAND,PHARMD@[email or web address]  10/21/23 MK    Staphylococcus aureus (BCID) NOT DETECTED NOT DETECTED Final   Staphylococcus epidermidis NOT DETECTED NOT DETECTED Final   Staphylococcus lugdunensis NOT DETECTED NOT DETECTED Final   Streptococcus species NOT DETECTED NOT DETECTED Final   Streptococcus agalactiae NOT DETECTED NOT DETECTED Final   Streptococcus pneumoniae NOT DETECTED NOT DETECTED Final   Streptococcus pyogenes NOT DETECTED NOT DETECTED Final   A.calcoaceticus-baumannii NOT DETECTED NOT DETECTED Final   Bacteroides fragilis NOT DETECTED NOT DETECTED Final   Enterobacterales NOT DETECTED NOT DETECTED Final   Enterobacter cloacae complex NOT DETECTED NOT DETECTED Final   Escherichia coli NOT DETECTED NOT DETECTED Final   Klebsiella aerogenes  NOT DETECTED NOT DETECTED Final   Klebsiella oxytoca NOT DETECTED NOT DETECTED Final   Klebsiella pneumoniae NOT DETECTED NOT DETECTED Final   Proteus species NOT DETECTED NOT DETECTED Final   Salmonella species NOT DETECTED NOT DETECTED Final   Serratia marcescens NOT DETECTED NOT DETECTED Final   Haemophilus influenzae NOT DETECTED NOT DETECTED Final   Neisseria meningitidis NOT DETECTED NOT DETECTED Final   Pseudomonas aeruginosa NOT DETECTED NOT DETECTED Final   Stenotrophomonas maltophilia NOT DETECTED NOT DETECTED Final   Candida albicans NOT DETECTED NOT DETECTED Final   Candida auris NOT DETECTED NOT DETECTED Final   Candida glabrata NOT DETECTED NOT DETECTED Final   Candida krusei NOT  DETECTED NOT DETECTED Final   Candida parapsilosis NOT DETECTED NOT DETECTED Final   Candida tropicalis NOT DETECTED NOT DETECTED Final   Cryptococcus neoformans/gattii NOT DETECTED NOT DETECTED Final    Comment: Performed at [facility], [address], [place], [place] [address]  Blood Culture (routine x 2)     Status: None   Collection Time: 10/20/23  6:31 AM   Specimen: BLOOD LEFT ARM  Result Value Ref Range Status   Specimen Description BLOOD LEFT ARM  Final   Special Requests   Final    BOTTLES DRAWN AEROBIC AND ANAEROBIC Blood Culture adequate volume   Culture   Final    NO GROWTH 5 DAYS Performed at [facility], [address], [place], [place] [address]    Report Status 10/25/2023 FINAL  Final  MRSA Next Gen by PCR, Nasal     Status: None   Collection Time: 10/24/23  2:35 PM   Specimen: Nasal Mucosa; Nasal Swab  Result Value Ref Range Status   MRSA by PCR Next Gen NOT DETECTED NOT DETECTED Final    Comment: (NOTE) The GeneXpert MRSA Assay (FDA approved for NASAL specimens only), is one component of a comprehensive MRSA colonization surveillance program. It is not intended to diagnose MRSA infection nor to guide or monitor treatment for MRSA infections. Test performance is not FDA approved in patients less than 22 years old. Performed at [facility], [address] [place], [place] [address]     Labs: CBC: Recent Labs  Lab 10/20/23 [id number] 10/21/23 0333 10/22/23 0302 10/23/23 0327 10/24/23 0302  WBC 9.2 5.5 3.3* 5.3 6.0  HGB 14.1 12.5* 13.3 13.8 14.5  HCT 42.1 37.7* 40.1 40.8 43.5  MCV 90.3 89.5 89.3 89.3 89.7  PLT 178 179 195 212 218   Basic Metabolic Panel: Recent Labs  Lab 10/21/23 0333 10/22/23 0302 10/23/23 0327 10/24/23 0302 10/25/23 0237  NA 139 137 140 141 139  K 3.4* 3.7 3.6 3.4* 3.8  CL 104 98 98 97* 99  CO2 25 23 28 31  33*  GLUCOSE 85 208* 278* 199* 191*  BUN 11 19 28* 31* 32*  CREATININE 0.99 1.05 1.27* 1.18 1.30*   CALCIUM 8.7* 8.4* 8.6* 8.7* 8.5*  PHOS  --  3.3 3.1 3.3 3.2   Liver Function Tests: Recent Labs  Lab 10/20/23 0605 10/22/23 0302 10/23/23 0327 10/24/23 0302 10/25/23 0237  AST 26  --   --   --   --   ALT 28  --   --   --   --   ALKPHOS 46  --   --   --   --   BILITOT 0.7  --   --   --   --   PROT 6.5  --   --   --   --  ALBUMIN 3.6 3.0* 3.1* 3.2* 3.2*   CBG: Recent Labs  Lab 10/24/23 1122 10/24/23 1615 10/24/23 2107 10/25/23 0534 10/25/23 1045  GLUCAP 221* 212* 235* 162* 266*    Discharge time spent: greater than 30 minutes.  Signed: Thad Ranger, MD [facility] 10/25/2023

## 2023-10-25 NOTE — Plan of Care (Signed)
   Problem: Coping: Goal: Ability to adjust to condition or change in health will improve Outcome: Progressing   Problem: Fluid Volume: Goal: Ability to maintain a balanced intake and output will improve Outcome: Progressing

## 2023-10-25 NOTE — TOC Progression Note (Addendum)
 Transition of Care [facility]) - Progression Note    Patient Details  Name: Lawrence [name] MRN: [id number] Date of Birth: 1956/05/14  Transition [facility] Care [facility] CM/SW Contact  Michaela Corner, [place] Phone Number: 10/25/2023, 10:45 AM  Clinical Narrative:   [facility] asked CSW if patient is okay with a semiprivate room. CSW informed patient that [facility] only has semi private rooms at this time. Patient stated that is okay if that is all they have. CSW notified [facility].   11:26 AM Per [facility], they need CSW to wait to call transport until 3 PM today.   TOC will continue to follow.    Expected Discharge Plan: Skilled Nursing Facility Barriers to Discharge: Continued Medical Work up, English as a second language teacher  Expected Discharge Plan and Services In-house Referral: Clinical Social Work     Living arrangements for the past 2 months: Skilled Holiday representative                   DME Agency: NA       HH Arranged: Charity fundraiser, Nurse's Aide HH Agency: [facility] ([facility]) Date HH Agency Contacted: 10/04/23 Time HH Agency Contacted: 1100 Representative spoke with at [facility] Agency: [facility] Dan   Social Determinants of Health (SDOH) Interventions SDOH Screenings   Food Insecurity: No Food Insecurity (10/20/2023)  Housing: Low Risk  (10/20/2023)  Transportation Needs: No Transportation Needs (10/20/2023)  Recent Concern: Transportation Needs - Unmet Transportation Needs (10/03/2023)  Utilities: Not At Risk (10/20/2023)  Alcohol Screen: Low Risk  (06/08/2021)  Financial Resource Strain: Medium Risk (12/06/2021)   Received from [facility], [facility]  Physical Activity: Unknown (12/06/2021)   Received from [facility], [facility]  Social Connections: Socially Isolated (10/20/2023)  Stress: No Stress Concern Present (01/21/2022)   Received from [facility], [facility]  Recent Concern: Stress - Stress Concern Present (11/19/2021)   Received from [facility]  Tobacco Use: Medium Risk  (10/20/2023)    Readmission Risk Interventions    07/24/2023   11:56 AM 05/08/2023    4:37 PM 01/25/2023    4:23 PM  Readmission Risk Prevention Plan  Transportation Screening Complete Complete Complete  PCP or Specialist Appt within 3-5 Days   Complete  HRI or Home Care Consult   Complete  Social Work Consult for Recovery Care Planning/Counseling   Complete  Palliative Care Screening   Not Applicable  Medication Review Oceanographer) Complete Complete Referral to Pharmacy  PCP or Specialist appointment within 3-5 days of discharge Complete Complete   HRI or Home Care Consult Complete Complete   SW Recovery Care/Counseling Consult Complete Complete   Palliative Care Screening Not Applicable Complete   Skilled Nursing Facility Complete Not Applicable

## 2023-10-25 NOTE — TOC Transition Note (Signed)
 Transition of Care [facility] - Discharge Note   Patient Details  Name: RONITH BERTI MRN: [id number] Date of Birth: 1955-12-31  [facility] of [facility] CM/SW Contact:  Michaela Corner, LCSWA Phone Number: 10/25/2023, 11:01 AM   Clinical Narrative:   [facility] will [facility]: [facility] Anticipated DC date: 10/25/23 Family notified: [name]  Transport by: Sharin Mons   Per MD patient ready for DC to [facility]. RN to call report prior to discharge [phone]; room 155B ). RN, patient, patient's family, and facility notified of DC. Discharge Summary and FL2 sent to facility. DC packet on chart. Ambulance transport requested for patient.   CSW will sign off for now as social work intervention is no longer needed. Please consult [place] again if new needs arise.      Final next level of care: Skilled Nursing Facility Barriers to Discharge: Barriers Resolved   Patient Goals and CMS Choice Patient states their goals for this hospitalization and ongoing recovery are:: To get better          Discharge Placement              Patient chooses bed at: Other - please specify in the comment section below: [facility]) Patient to be transferred to facility by: [facility] Name of family member notified: [name] Patient and family notified of of transfer: 10/25/23  Discharge Plan and Services Additional resources added to the After Visit Summary for   In-house Referral: Clinical Social Work                DME Agency: NA       HH Arranged: Charity fundraiser, Nurse's Aide HH Agency: [facility] Date HH Agency Contacted: 10/04/23 Time HH Agency Contacted: 1100 Representative spoke with at [facility] Agency: [facility]  Social Drivers of Health (SDOH) Interventions SDOH Screenings   Food Insecurity: No Food Insecurity (10/20/2023)  Housing: Low Risk  (10/20/2023)  Transportation Needs: No Transportation Needs (10/20/2023)  Recent Concern: Transportation Needs - Unmet Transportation Needs (10/03/2023)   Utilities: Not At Risk (10/20/2023)  Alcohol Screen: Low Risk  (06/08/2021)  Financial Resource Strain: Medium Risk (12/06/2021)   Received from [facility], [facility]  Physical Activity: Unknown (12/06/2021)   Received from [facility], [facility]  Social Connections: Socially Isolated (10/20/2023)  Stress: No Stress Concern Present (01/21/2022)   Received from [facility], [facility]  Recent Concern: Stress - Stress Concern Present (11/19/2021)   Received from [facility]  Tobacco Use: Medium Risk (10/20/2023)     Readmission Risk Interventions    07/24/2023   11:56 AM 05/08/2023    4:37 PM 01/25/2023    4:23 PM  Readmission Risk Prevention Plan  Transportation Screening Complete Complete Complete  PCP or Specialist Appt within 3-5 Days   Complete  HRI or Home Care Consult   Complete  Social Work Consult for Recovery Care Planning/Counseling   Complete  Palliative Care Screening   Not Applicable  Medication Review Oceanographer) Complete Complete Referral to Pharmacy  PCP or Specialist appointment within 3-5 days of discharge Complete Complete   HRI or Home Care Consult Complete Complete   SW Recovery Care/Counseling Consult Complete Complete   Palliative Care Screening Not Applicable Complete   Skilled Nursing Facility Complete Not Applicable

## 2023-10-27 DIAGNOSIS — I5032 Chronic diastolic (congestive) heart failure: Secondary | ICD-10-CM | POA: Diagnosis not present

## 2023-10-27 DIAGNOSIS — R6520 Severe sepsis without septic shock: Secondary | ICD-10-CM | POA: Diagnosis not present

## 2023-10-27 DIAGNOSIS — J441 Chronic obstructive pulmonary disease with (acute) exacerbation: Secondary | ICD-10-CM | POA: Diagnosis not present

## 2023-10-27 DIAGNOSIS — J9601 Acute respiratory failure with hypoxia: Secondary | ICD-10-CM | POA: Diagnosis not present

## 2023-10-30 DIAGNOSIS — I480 Paroxysmal atrial fibrillation: Secondary | ICD-10-CM | POA: Diagnosis not present

## 2023-10-30 DIAGNOSIS — R531 Weakness: Secondary | ICD-10-CM | POA: Diagnosis not present

## 2023-10-30 DIAGNOSIS — J441 Chronic obstructive pulmonary disease with (acute) exacerbation: Secondary | ICD-10-CM | POA: Diagnosis not present

## 2023-10-30 DIAGNOSIS — R6520 Severe sepsis without septic shock: Secondary | ICD-10-CM | POA: Diagnosis not present

## 2023-10-30 DIAGNOSIS — J9601 Acute respiratory failure with hypoxia: Secondary | ICD-10-CM | POA: Diagnosis not present

## 2023-10-30 DIAGNOSIS — I5032 Chronic diastolic (congestive) heart failure: Secondary | ICD-10-CM | POA: Diagnosis not present

## 2023-10-31 DIAGNOSIS — G8929 Other chronic pain: Secondary | ICD-10-CM | POA: Diagnosis not present

## 2023-10-31 DIAGNOSIS — E1140 Type 2 diabetes mellitus with diabetic neuropathy, unspecified: Secondary | ICD-10-CM | POA: Diagnosis not present

## 2023-11-07 ENCOUNTER — Telehealth (HOSPITAL_COMMUNITY): Payer: Self-pay

## 2023-11-07 DIAGNOSIS — R262 Difficulty in walking, not elsewhere classified: Secondary | ICD-10-CM | POA: Diagnosis not present

## 2023-11-07 DIAGNOSIS — I69811 Memory deficit following other cerebrovascular disease: Secondary | ICD-10-CM | POA: Diagnosis not present

## 2023-11-07 DIAGNOSIS — I5030 Unspecified diastolic (congestive) heart failure: Secondary | ICD-10-CM | POA: Diagnosis not present

## 2023-11-07 DIAGNOSIS — M6281 Muscle weakness (generalized): Secondary | ICD-10-CM | POA: Diagnosis not present

## 2023-11-07 DIAGNOSIS — I5033 Acute on chronic diastolic (congestive) heart failure: Secondary | ICD-10-CM | POA: Diagnosis not present

## 2023-11-07 DIAGNOSIS — J449 Chronic obstructive pulmonary disease, unspecified: Secondary | ICD-10-CM | POA: Diagnosis not present

## 2023-11-07 DIAGNOSIS — Z8673 Personal history of transient ischemic attack (TIA), and cerebral infarction without residual deficits: Secondary | ICD-10-CM | POA: Diagnosis not present

## 2023-11-07 DIAGNOSIS — I63542 Cerebral infarction due to unspecified occlusion or stenosis of left cerebellar artery: Secondary | ICD-10-CM | POA: Diagnosis not present

## 2023-11-07 DIAGNOSIS — G8194 Hemiplegia, unspecified affecting left nondominant side: Secondary | ICD-10-CM | POA: Diagnosis not present

## 2023-11-07 DIAGNOSIS — I4891 Unspecified atrial fibrillation: Secondary | ICD-10-CM | POA: Diagnosis not present

## 2023-11-07 DIAGNOSIS — E119 Type 2 diabetes mellitus without complications: Secondary | ICD-10-CM | POA: Diagnosis not present

## 2023-11-07 NOTE — Telephone Encounter (Signed)
 Called to confirm/remind patient of their appointment [facility] on [facility].   Appointment:   [] Confirmed  [x] Left mess   [] No answer/No voice mail  [] Phone not in service  And to bring in all medications and/or complete list.

## 2023-11-08 ENCOUNTER — Inpatient Hospital Stay (HOSPITAL_COMMUNITY)
Admission: RE | Admit: 2023-11-08 | Discharge: 2023-11-08 | Disposition: A | Discharge status: Home / Self Care | Source: Ambulatory Visit

## 2023-11-08 ENCOUNTER — Other Ambulatory Visit: Payer: Self-pay | Admitting: Adult Health

## 2023-11-13 DIAGNOSIS — R262 Difficulty in walking, not elsewhere classified: Secondary | ICD-10-CM | POA: Diagnosis not present

## 2023-11-13 DIAGNOSIS — M6281 Muscle weakness (generalized): Secondary | ICD-10-CM | POA: Diagnosis not present

## 2023-11-13 DIAGNOSIS — I69811 Memory deficit following other cerebrovascular disease: Secondary | ICD-10-CM | POA: Diagnosis not present

## 2023-11-13 DIAGNOSIS — I63542 Cerebral infarction due to unspecified occlusion or stenosis of left cerebellar artery: Secondary | ICD-10-CM | POA: Diagnosis not present

## 2023-11-13 DIAGNOSIS — I5030 Unspecified diastolic (congestive) heart failure: Secondary | ICD-10-CM | POA: Diagnosis not present

## 2023-11-17 DIAGNOSIS — E1140 Type 2 diabetes mellitus with diabetic neuropathy, unspecified: Secondary | ICD-10-CM | POA: Diagnosis not present

## 2023-11-17 DIAGNOSIS — G8929 Other chronic pain: Secondary | ICD-10-CM | POA: Diagnosis not present

## 2023-11-20 DIAGNOSIS — R2689 Other abnormalities of gait and mobility: Secondary | ICD-10-CM | POA: Diagnosis not present

## 2023-11-20 DIAGNOSIS — N19 Unspecified kidney failure: Secondary | ICD-10-CM | POA: Diagnosis not present

## 2023-11-20 DIAGNOSIS — J449 Chronic obstructive pulmonary disease, unspecified: Secondary | ICD-10-CM | POA: Diagnosis not present

## 2023-11-20 DIAGNOSIS — M6281 Muscle weakness (generalized): Secondary | ICD-10-CM | POA: Diagnosis not present

## 2023-11-20 DIAGNOSIS — E119 Type 2 diabetes mellitus without complications: Secondary | ICD-10-CM | POA: Diagnosis not present

## 2023-11-20 DIAGNOSIS — I4891 Unspecified atrial fibrillation: Secondary | ICD-10-CM | POA: Diagnosis not present

## 2023-11-20 DIAGNOSIS — I5033 Acute on chronic diastolic (congestive) heart failure: Secondary | ICD-10-CM | POA: Diagnosis not present

## 2023-11-20 DIAGNOSIS — G8194 Hemiplegia, unspecified affecting left nondominant side: Secondary | ICD-10-CM | POA: Diagnosis not present

## 2023-11-20 DIAGNOSIS — Z8673 Personal history of transient ischemic attack (TIA), and cerebral infarction without residual deficits: Secondary | ICD-10-CM | POA: Diagnosis not present

## 2023-11-21 ENCOUNTER — Ambulatory Visit: Payer: 59 | Admitting: Professional Counselor

## 2023-11-21 DIAGNOSIS — M6281 Muscle weakness (generalized): Secondary | ICD-10-CM | POA: Diagnosis not present

## 2023-11-21 DIAGNOSIS — K219 Gastro-esophageal reflux disease without esophagitis: Secondary | ICD-10-CM | POA: Diagnosis not present

## 2023-11-21 DIAGNOSIS — G8194 Hemiplegia, unspecified affecting left nondominant side: Secondary | ICD-10-CM | POA: Diagnosis not present

## 2023-11-21 DIAGNOSIS — G4700 Insomnia, unspecified: Secondary | ICD-10-CM | POA: Diagnosis not present

## 2023-11-21 DIAGNOSIS — E119 Type 2 diabetes mellitus without complications: Secondary | ICD-10-CM | POA: Diagnosis not present

## 2023-11-21 DIAGNOSIS — R2689 Other abnormalities of gait and mobility: Secondary | ICD-10-CM | POA: Diagnosis not present

## 2023-11-21 DIAGNOSIS — E1140 Type 2 diabetes mellitus with diabetic neuropathy, unspecified: Secondary | ICD-10-CM | POA: Diagnosis not present

## 2023-11-21 DIAGNOSIS — I480 Paroxysmal atrial fibrillation: Secondary | ICD-10-CM | POA: Diagnosis not present

## 2023-11-22 DIAGNOSIS — E119 Type 2 diabetes mellitus without complications: Secondary | ICD-10-CM | POA: Diagnosis not present

## 2023-11-22 DIAGNOSIS — E1151 Type 2 diabetes mellitus with diabetic peripheral angiopathy without gangrene: Secondary | ICD-10-CM | POA: Diagnosis not present

## 2023-11-22 DIAGNOSIS — G8194 Hemiplegia, unspecified affecting left nondominant side: Secondary | ICD-10-CM | POA: Diagnosis not present

## 2023-11-22 DIAGNOSIS — M6281 Muscle weakness (generalized): Secondary | ICD-10-CM | POA: Diagnosis not present

## 2023-11-22 DIAGNOSIS — Z794 Long term (current) use of insulin: Secondary | ICD-10-CM | POA: Diagnosis not present

## 2023-11-22 DIAGNOSIS — G63 Polyneuropathy in diseases classified elsewhere: Secondary | ICD-10-CM | POA: Diagnosis not present

## 2023-11-22 DIAGNOSIS — R2689 Other abnormalities of gait and mobility: Secondary | ICD-10-CM | POA: Diagnosis not present

## 2023-11-22 DIAGNOSIS — E1142 Type 2 diabetes mellitus with diabetic polyneuropathy: Secondary | ICD-10-CM | POA: Diagnosis not present

## 2023-11-22 DIAGNOSIS — I10 Essential (primary) hypertension: Secondary | ICD-10-CM | POA: Diagnosis not present

## 2023-11-22 DIAGNOSIS — I4819 Other persistent atrial fibrillation: Secondary | ICD-10-CM | POA: Diagnosis not present

## 2023-11-23 DIAGNOSIS — R2689 Other abnormalities of gait and mobility: Secondary | ICD-10-CM | POA: Diagnosis not present

## 2023-11-23 DIAGNOSIS — E119 Type 2 diabetes mellitus without complications: Secondary | ICD-10-CM | POA: Diagnosis not present

## 2023-11-23 DIAGNOSIS — M6281 Muscle weakness (generalized): Secondary | ICD-10-CM | POA: Diagnosis not present

## 2023-11-23 DIAGNOSIS — G8194 Hemiplegia, unspecified affecting left nondominant side: Secondary | ICD-10-CM | POA: Diagnosis not present

## 2023-11-24 DIAGNOSIS — N182 Chronic kidney disease, stage 2 (mild): Secondary | ICD-10-CM | POA: Diagnosis not present

## 2023-11-24 DIAGNOSIS — R109 Unspecified abdominal pain: Secondary | ICD-10-CM | POA: Diagnosis not present

## 2023-11-24 DIAGNOSIS — E785 Hyperlipidemia, unspecified: Secondary | ICD-10-CM | POA: Diagnosis not present

## 2023-11-24 DIAGNOSIS — M6281 Muscle weakness (generalized): Secondary | ICD-10-CM | POA: Diagnosis not present

## 2023-11-24 DIAGNOSIS — D509 Iron deficiency anemia, unspecified: Secondary | ICD-10-CM | POA: Diagnosis not present

## 2023-11-24 DIAGNOSIS — G8194 Hemiplegia, unspecified affecting left nondominant side: Secondary | ICD-10-CM | POA: Diagnosis not present

## 2023-11-24 DIAGNOSIS — R2689 Other abnormalities of gait and mobility: Secondary | ICD-10-CM | POA: Diagnosis not present

## 2023-11-24 DIAGNOSIS — E876 Hypokalemia: Secondary | ICD-10-CM | POA: Diagnosis not present

## 2023-11-24 DIAGNOSIS — M109 Gout, unspecified: Secondary | ICD-10-CM | POA: Diagnosis not present

## 2023-11-24 DIAGNOSIS — E119 Type 2 diabetes mellitus without complications: Secondary | ICD-10-CM | POA: Diagnosis not present

## 2023-11-26 DIAGNOSIS — G8194 Hemiplegia, unspecified affecting left nondominant side: Secondary | ICD-10-CM | POA: Diagnosis not present

## 2023-11-26 DIAGNOSIS — E119 Type 2 diabetes mellitus without complications: Secondary | ICD-10-CM | POA: Diagnosis not present

## 2023-11-26 DIAGNOSIS — R2689 Other abnormalities of gait and mobility: Secondary | ICD-10-CM | POA: Diagnosis not present

## 2023-11-26 DIAGNOSIS — M6281 Muscle weakness (generalized): Secondary | ICD-10-CM | POA: Diagnosis not present

## 2023-11-27 ENCOUNTER — Emergency Department (HOSPITAL_COMMUNITY)

## 2023-11-27 ENCOUNTER — Inpatient Hospital Stay (HOSPITAL_COMMUNITY)
Admission: EM | Admit: 2023-11-27 | Discharge: 2023-12-08 | DRG: 871 | Disposition: A | Discharge status: Skilled Nursing Facility | Source: Skilled Nursing Facility | Attending: Internal Medicine | Admitting: Internal Medicine

## 2023-11-27 ENCOUNTER — Other Ambulatory Visit: Payer: Self-pay

## 2023-11-27 DIAGNOSIS — U071 COVID-19: Secondary | ICD-10-CM | POA: Diagnosis not present

## 2023-11-27 DIAGNOSIS — W050XXA Fall from non-moving wheelchair, initial encounter: Secondary | ICD-10-CM | POA: Diagnosis present

## 2023-11-27 DIAGNOSIS — Z8673 Personal history of transient ischemic attack (TIA), and cerebral infarction without residual deficits: Secondary | ICD-10-CM

## 2023-11-27 DIAGNOSIS — F32A Depression, unspecified: Secondary | ICD-10-CM | POA: Diagnosis present

## 2023-11-27 DIAGNOSIS — I878 Other specified disorders of veins: Secondary | ICD-10-CM | POA: Diagnosis present

## 2023-11-27 DIAGNOSIS — I959 Hypotension, unspecified: Secondary | ICD-10-CM | POA: Diagnosis present

## 2023-11-27 DIAGNOSIS — Z794 Long term (current) use of insulin: Secondary | ICD-10-CM

## 2023-11-27 DIAGNOSIS — Z66 Do not resuscitate: Secondary | ICD-10-CM | POA: Diagnosis present

## 2023-11-27 DIAGNOSIS — R6510 Systemic inflammatory response syndrome (SIRS) of non-infectious origin without acute organ dysfunction: Secondary | ICD-10-CM | POA: Diagnosis not present

## 2023-11-27 DIAGNOSIS — Z1621 Resistance to vancomycin: Secondary | ICD-10-CM | POA: Diagnosis present

## 2023-11-27 DIAGNOSIS — E66813 Obesity, class 3: Secondary | ICD-10-CM | POA: Diagnosis present

## 2023-11-27 DIAGNOSIS — Z952 Presence of prosthetic heart valve: Secondary | ICD-10-CM

## 2023-11-27 DIAGNOSIS — Z881 Allergy status to other antibiotic agents status: Secondary | ICD-10-CM

## 2023-11-27 DIAGNOSIS — Z953 Presence of xenogenic heart valve: Secondary | ICD-10-CM

## 2023-11-27 DIAGNOSIS — Z79899 Other long term (current) drug therapy: Secondary | ICD-10-CM

## 2023-11-27 DIAGNOSIS — G40909 Epilepsy, unspecified, not intractable, without status epilepticus: Secondary | ICD-10-CM

## 2023-11-27 DIAGNOSIS — S0990XA Unspecified injury of head, initial encounter: Secondary | ICD-10-CM | POA: Diagnosis not present

## 2023-11-27 DIAGNOSIS — E7800 Pure hypercholesterolemia, unspecified: Secondary | ICD-10-CM | POA: Diagnosis present

## 2023-11-27 DIAGNOSIS — G4733 Obstructive sleep apnea (adult) (pediatric): Secondary | ICD-10-CM | POA: Diagnosis present

## 2023-11-27 DIAGNOSIS — A4181 Sepsis due to Enterococcus: Secondary | ICD-10-CM | POA: Diagnosis not present

## 2023-11-27 DIAGNOSIS — E1142 Type 2 diabetes mellitus with diabetic polyneuropathy: Secondary | ICD-10-CM | POA: Diagnosis present

## 2023-11-27 DIAGNOSIS — E1169 Type 2 diabetes mellitus with other specified complication: Secondary | ICD-10-CM | POA: Diagnosis not present

## 2023-11-27 DIAGNOSIS — I77810 Thoracic aortic ectasia: Secondary | ICD-10-CM | POA: Diagnosis present

## 2023-11-27 DIAGNOSIS — F419 Anxiety disorder, unspecified: Secondary | ICD-10-CM | POA: Diagnosis present

## 2023-11-27 DIAGNOSIS — W19XXXA Unspecified fall, initial encounter: Secondary | ICD-10-CM | POA: Diagnosis not present

## 2023-11-27 DIAGNOSIS — F4310 Post-traumatic stress disorder, unspecified: Secondary | ICD-10-CM | POA: Diagnosis present

## 2023-11-27 DIAGNOSIS — M109 Gout, unspecified: Secondary | ICD-10-CM | POA: Diagnosis present

## 2023-11-27 DIAGNOSIS — Z8701 Personal history of pneumonia (recurrent): Secondary | ICD-10-CM

## 2023-11-27 DIAGNOSIS — Z96652 Presence of left artificial knee joint: Secondary | ICD-10-CM | POA: Diagnosis present

## 2023-11-27 DIAGNOSIS — Z7985 Long-term (current) use of injectable non-insulin antidiabetic drugs: Secondary | ICD-10-CM

## 2023-11-27 DIAGNOSIS — E11649 Type 2 diabetes mellitus with hypoglycemia without coma: Secondary | ICD-10-CM | POA: Diagnosis not present

## 2023-11-27 DIAGNOSIS — J449 Chronic obstructive pulmonary disease, unspecified: Secondary | ICD-10-CM | POA: Diagnosis present

## 2023-11-27 DIAGNOSIS — E785 Hyperlipidemia, unspecified: Secondary | ICD-10-CM | POA: Diagnosis present

## 2023-11-27 DIAGNOSIS — I499 Cardiac arrhythmia, unspecified: Secondary | ICD-10-CM | POA: Diagnosis not present

## 2023-11-27 DIAGNOSIS — I2510 Atherosclerotic heart disease of native coronary artery without angina pectoris: Secondary | ICD-10-CM | POA: Diagnosis present

## 2023-11-27 DIAGNOSIS — I493 Ventricular premature depolarization: Secondary | ICD-10-CM | POA: Diagnosis present

## 2023-11-27 DIAGNOSIS — Z8249 Family history of ischemic heart disease and other diseases of the circulatory system: Secondary | ICD-10-CM

## 2023-11-27 DIAGNOSIS — I5032 Chronic diastolic (congestive) heart failure: Secondary | ICD-10-CM | POA: Diagnosis present

## 2023-11-27 DIAGNOSIS — Z993 Dependence on wheelchair: Secondary | ICD-10-CM

## 2023-11-27 DIAGNOSIS — Z87891 Personal history of nicotine dependence: Secondary | ICD-10-CM

## 2023-11-27 DIAGNOSIS — I480 Paroxysmal atrial fibrillation: Secondary | ICD-10-CM | POA: Diagnosis present

## 2023-11-27 DIAGNOSIS — S0081XA Abrasion of other part of head, initial encounter: Secondary | ICD-10-CM | POA: Diagnosis present

## 2023-11-27 DIAGNOSIS — E119 Type 2 diabetes mellitus without complications: Secondary | ICD-10-CM | POA: Diagnosis present

## 2023-11-27 DIAGNOSIS — I110 Hypertensive heart disease with heart failure: Secondary | ICD-10-CM | POA: Diagnosis present

## 2023-11-27 DIAGNOSIS — Z6841 Body Mass Index (BMI) 40.0 and over, adult: Secondary | ICD-10-CM

## 2023-11-27 DIAGNOSIS — N400 Enlarged prostate without lower urinary tract symptoms: Secondary | ICD-10-CM | POA: Diagnosis present

## 2023-11-27 DIAGNOSIS — R509 Fever, unspecified: Secondary | ICD-10-CM | POA: Diagnosis not present

## 2023-11-27 DIAGNOSIS — E669 Obesity, unspecified: Secondary | ICD-10-CM

## 2023-11-27 DIAGNOSIS — Z7901 Long term (current) use of anticoagulants: Secondary | ICD-10-CM

## 2023-11-27 DIAGNOSIS — G4489 Other headache syndrome: Secondary | ICD-10-CM | POA: Diagnosis not present

## 2023-11-27 LAB — I-STAT CG4 LACTIC ACID, ED
Lactic Acid, Venous: 1.8 mmol/L (ref 0.5–1.9)
Lactic Acid, Venous: 0.8 mmol/L (ref 0.5–1.9)

## 2023-11-27 LAB — COMPREHENSIVE METABOLIC PANEL WITH GFR
ALT: 25 U/L (ref 0–44)
AST: 25 U/L (ref 15–41)
Albumin: 3.3 g/dL — ABNORMAL LOW (ref 3.5–5.0)
Alkaline Phosphatase: 43 U/L (ref 38–126)
Anion gap: 10 (ref 5–15)
BUN: 14 mg/dL (ref 8–23)
CO2: 26 mmol/L (ref 22–32)
Calcium: 8.7 mg/dL — ABNORMAL LOW (ref 8.9–10.3)
Chloride: 101 mmol/L (ref 98–111)
Creatinine, Ser: 1.12 mg/dL (ref 0.61–1.24)
GFR, Estimated: >60 mL/min (ref >60)
Glucose, Bld: 196 mg/dL — ABNORMAL HIGH (ref 70–99)
Potassium: 3.7 mmol/L (ref 3.5–5.1)
Sodium: 137 mmol/L (ref 135–145)
Total Bilirubin: 0.9 mg/dL (ref 0.0–1.2)
Total Protein: 6.1 g/dL — ABNORMAL LOW (ref 6.5–8.1)

## 2023-11-27 LAB — CBC WITH DIFFERENTIAL/PLATELET
Abs Immature Granulocytes: 0.05 10*3/uL (ref 0.00–0.07)
Basophils Absolute: 0 10*3/uL (ref 0.0–0.1)
Basophils Relative: 0 %
Eosinophils Absolute: 0.1 10*3/uL (ref 0.0–0.5)
Eosinophils Relative: 1 %
HCT: 40.5 % (ref 39.0–52.0)
Hemoglobin: 13.6 g/dL (ref 13.0–17.0)
Immature Granulocytes: 1 %
Lymphocytes Relative: 8 %
Lymphs Abs: 0.6 10*3/uL — ABNORMAL LOW (ref 0.7–4.0)
MCH: 30.7 pg (ref 26.0–34.0)
MCHC: 33.6 g/dL (ref 30.0–36.0)
MCV: 91.4 fL (ref 80.0–100.0)
Monocytes Absolute: 0.6 10*3/uL (ref 0.1–1.0)
Monocytes Relative: 8 %
Neutro Abs: 6.7 10*3/uL (ref 1.7–7.7)
Neutrophils Relative %: 82 %
Platelets: 184 10*3/uL (ref 150–400)
RBC: 4.43 MIL/uL (ref 4.22–5.81)
RDW: 13.8 % (ref 11.5–15.5)
WBC: 8.1 10*3/uL (ref 4.0–10.5)
nRBC: 0 % (ref 0.0–0.2)

## 2023-11-27 LAB — URINALYSIS, W/ REFLEX TO CULTURE (INFECTION SUSPECTED)
Bilirubin Urine: NEGATIVE
Glucose, UA: 50 mg/dL — AB
Hgb urine dipstick: NEGATIVE
Ketones, ur: NEGATIVE mg/dL
Leukocytes,Ua: NEGATIVE
Nitrite: NEGATIVE
Protein, ur: NEGATIVE mg/dL
Specific Gravity, Urine: 1.009 (ref 1.005–1.030)
pH: 7 (ref 5.0–8.0)

## 2023-11-27 LAB — RESP PANEL BY RT-PCR (RSV, FLU A&B, COVID)  RVPGX2
Influenza A by PCR: NEGATIVE
Influenza B by PCR: NEGATIVE
Resp Syncytial Virus by PCR: NEGATIVE
SARS Coronavirus 2 by RT PCR: POSITIVE — AB

## 2023-11-27 LAB — I-STAT CHEM 8, ED
BUN: 16 mg/dL (ref 8–23)
Calcium, Ion: 1.12 mmol/L — ABNORMAL LOW (ref 1.15–1.40)
Chloride: 99 mmol/L (ref 98–111)
Creatinine, Ser: 1.1 mg/dL (ref 0.61–1.24)
Glucose, Bld: 192 mg/dL — ABNORMAL HIGH (ref 70–99)
HCT: 40 % (ref 39.0–52.0)
Hemoglobin: 13.6 g/dL (ref 13.0–17.0)
Potassium: 3.6 mmol/L (ref 3.5–5.1)
Sodium: 139 mmol/L (ref 135–145)
TCO2: 27 mmol/L (ref 22–32)

## 2023-11-27 LAB — TROPONIN I (HIGH SENSITIVITY)
Troponin I (High Sensitivity): 10 ng/L (ref <18)
Troponin I (High Sensitivity): 14 ng/L (ref <18)

## 2023-11-27 LAB — GLUCOSE, CAPILLARY
Glucose-Capillary: 105 mg/dL — ABNORMAL HIGH (ref 70–99)
Glucose-Capillary: 116 mg/dL — ABNORMAL HIGH (ref 70–99)

## 2023-11-27 LAB — BRAIN NATRIURETIC PEPTIDE: B Natriuretic Peptide: 74.8 pg/mL (ref 0.0–100.0)

## 2023-11-27 LAB — PROTIME-INR
INR: 1.1 (ref 0.8–1.2)
Prothrombin Time: 14.7 s (ref 11.4–15.2)

## 2023-11-27 LAB — HIV ANTIBODY (ROUTINE TESTING W REFLEX): HIV Screen 4th Generation wRfx: NONREACTIVE

## 2023-11-27 LAB — PROCALCITONIN: Procalcitonin: 0.39 ng/mL

## 2023-11-27 MED ORDER — VANCOMYCIN HCL IN DEXTROSE 1-5 GM/200ML-% IV SOLN
1000.0000 mg | Freq: Once | INTRAVENOUS | Status: DC
Start: 2023-11-27 — End: 2023-11-27

## 2023-11-27 MED ORDER — FENOFIBRATE 160 MG PO TABS
160.0000 mg | ORAL_TABLET | Freq: Every day | ORAL | Status: DC
  Administered 2023-11-28 – 2023-12-08 (×11): 160 mg via ORAL
  Filled 2023-11-27 (×11): qty 1

## 2023-11-27 MED ORDER — REPAGLINIDE 1 MG PO TABS
2.0000 mg | ORAL_TABLET | Freq: Two times a day (BID) | ORAL | Status: DC
  Administered 2023-11-28 – 2023-12-05 (×16): 2 mg via ORAL
  Filled 2023-11-27: qty 1
  Filled 2023-11-27 (×5): qty 2
  Filled 2023-11-27: qty 1
  Filled 2023-11-27: qty 2
  Filled 2023-11-27: qty 1
  Filled 2023-11-27 (×8): qty 2
  Filled 2023-11-27: qty 1
  Filled 2023-11-27 (×2): qty 2

## 2023-11-27 MED ORDER — MUSCLE RUB 10-15 % EX CREA
1.0000 | TOPICAL_CREAM | CUTANEOUS | Status: DC | PRN
  Administered 2023-11-27 – 2023-12-07 (×5): 1 via TOPICAL
  Filled 2023-11-27: qty 85

## 2023-11-27 MED ORDER — APIXABAN 5 MG PO TABS
5.0000 mg | ORAL_TABLET | Freq: Two times a day (BID) | ORAL | Status: DC
  Administered 2023-11-27 – 2023-12-08 (×22): 5 mg via ORAL
  Filled 2023-11-27 (×7): qty 1
  Filled 2023-11-27: qty 2
  Filled 2023-11-27 (×14): qty 1

## 2023-11-27 MED ORDER — VENLAFAXINE HCL ER 75 MG PO CP24
150.0000 mg | ORAL_CAPSULE | Freq: Every day | ORAL | Status: DC
  Administered 2023-11-28 – 2023-12-08 (×11): 150 mg via ORAL
  Filled 2023-11-27 (×11): qty 2

## 2023-11-27 MED ORDER — LEVETIRACETAM 500 MG PO TABS
500.0000 mg | ORAL_TABLET | Freq: Two times a day (BID) | ORAL | Status: DC
  Administered 2023-11-27 – 2023-12-08 (×22): 500 mg via ORAL
  Filled 2023-11-27 (×22): qty 1

## 2023-11-27 MED ORDER — ROPINIROLE HCL 0.25 MG PO TABS
0.2500 mg | ORAL_TABLET | Freq: Two times a day (BID) | ORAL | Status: DC
  Administered 2023-11-27 – 2023-12-08 (×22): 0.25 mg via ORAL
  Filled 2023-11-27 (×24): qty 1

## 2023-11-27 MED ORDER — ATORVASTATIN CALCIUM 40 MG PO TABS
40.0000 mg | ORAL_TABLET | Freq: Every day | ORAL | Status: DC
  Administered 2023-11-27: 40 mg via ORAL
  Filled 2023-11-27: qty 1

## 2023-11-27 MED ORDER — LACTATED RINGERS IV SOLN: INTRAVENOUS | Status: DC

## 2023-11-27 MED ORDER — LACTATED RINGERS IV BOLUS
1000.0000 mL | Freq: Once | INTRAVENOUS | Status: AC
  Administered 2023-11-27: 1000 mL via INTRAVENOUS

## 2023-11-27 MED ORDER — ACETAMINOPHEN 325 MG PO TABS
650.0000 mg | ORAL_TABLET | Freq: Four times a day (QID) | ORAL | Status: DC | PRN
  Administered 2023-11-29 – 2023-12-08 (×10): 650 mg via ORAL
  Filled 2023-11-27 (×12): qty 2

## 2023-11-27 MED ORDER — ARIPIPRAZOLE 5 MG PO TABS
5.0000 mg | ORAL_TABLET | Freq: Every day | ORAL | Status: DC
  Administered 2023-11-27 – 2023-12-07 (×11): 5 mg via ORAL
  Filled 2023-11-27 (×11): qty 1

## 2023-11-27 MED ORDER — PIPERACILLIN-TAZOBACTAM 3.375 G IVPB 30 MIN
3.3750 g | Freq: Once | INTRAVENOUS | Status: AC
  Administered 2023-11-27: 3.375 g via INTRAVENOUS
  Filled 2023-11-27: qty 50

## 2023-11-27 MED ORDER — IOHEXOL 350 MG/ML SOLN
125.0000 mL | Freq: Once | INTRAVENOUS | Status: AC | PRN
  Administered 2023-11-27: 125 mL via INTRAVENOUS

## 2023-11-27 MED ORDER — UMECLIDINIUM BROMIDE 62.5 MCG/ACT IN AEPB
1.0000 | INHALATION_SPRAY | Freq: Every day | RESPIRATORY_TRACT | Status: DC
  Administered 2023-12-04 – 2023-12-08 (×5): 1 via RESPIRATORY_TRACT
  Filled 2023-11-27 (×3): qty 7

## 2023-11-27 MED ORDER — LACOSAMIDE 50 MG PO TABS
150.0000 mg | ORAL_TABLET | Freq: Two times a day (BID) | ORAL | Status: DC
  Administered 2023-11-27 – 2023-12-08 (×22): 150 mg via ORAL
  Filled 2023-11-27 (×22): qty 3

## 2023-11-27 MED ORDER — SODIUM CHLORIDE 0.9% FLUSH
3.0000 mL | Freq: Two times a day (BID) | INTRAVENOUS | Status: DC
  Administered 2023-11-27 – 2023-12-08 (×21): 3 mL via INTRAVENOUS

## 2023-11-27 MED ORDER — INSULIN ASPART 100 UNIT/ML IJ SOLN
0.0000 [IU] | Freq: Every day | INTRAMUSCULAR | Status: DC
  Administered 2023-11-28: 2 [IU] via SUBCUTANEOUS

## 2023-11-27 MED ORDER — PRAZOSIN HCL 2 MG PO CAPS
2.0000 mg | ORAL_CAPSULE | Freq: Every day | ORAL | Status: DC
  Administered 2023-11-28 – 2023-12-07 (×10): 2 mg via ORAL
  Filled 2023-11-27 (×12): qty 1

## 2023-11-27 MED ORDER — HYDROCODONE-ACETAMINOPHEN 5-325 MG PO TABS
1.0000 | ORAL_TABLET | Freq: Four times a day (QID) | ORAL | Status: DC | PRN
  Administered 2023-11-27 – 2023-12-08 (×18): 1 via ORAL
  Filled 2023-11-27 (×19): qty 1

## 2023-11-27 MED ORDER — FLUTICASONE PROPIONATE 50 MCG/ACT NA SUSP
2.0000 | Freq: Every day | NASAL | Status: DC
  Administered 2023-11-28 – 2023-12-08 (×11): 2 via NASAL
  Filled 2023-11-27 (×2): qty 16

## 2023-11-27 MED ORDER — INSULIN ASPART 100 UNIT/ML IJ SOLN
0.0000 [IU] | Freq: Three times a day (TID) | INTRAMUSCULAR | Status: DC
  Administered 2023-11-28: 2 [IU] via SUBCUTANEOUS
  Administered 2023-11-28: 3 [IU] via SUBCUTANEOUS
  Administered 2023-11-28: 2 [IU] via SUBCUTANEOUS
  Administered 2023-11-29: 1 [IU] via SUBCUTANEOUS
  Administered 2023-11-29: 2 [IU] via SUBCUTANEOUS
  Administered 2023-11-29: 3 [IU] via SUBCUTANEOUS
  Administered 2023-12-01: 1 [IU] via SUBCUTANEOUS
  Administered 2023-12-01 – 2023-12-02 (×2): 2 [IU] via SUBCUTANEOUS
  Administered 2023-12-02: 3 [IU] via SUBCUTANEOUS

## 2023-11-27 MED ORDER — TRAZODONE HCL 50 MG PO TABS
200.0000 mg | ORAL_TABLET | Freq: Every day | ORAL | Status: DC
  Administered 2023-11-27 – 2023-12-07 (×11): 200 mg via ORAL
  Filled 2023-11-27 (×11): qty 4

## 2023-11-27 MED ORDER — BUSPIRONE HCL 5 MG PO TABS
10.0000 mg | ORAL_TABLET | Freq: Three times a day (TID) | ORAL | Status: DC
  Administered 2023-11-27 – 2023-12-08 (×33): 10 mg via ORAL
  Filled 2023-11-27 (×33): qty 2

## 2023-11-27 MED ORDER — TIOTROPIUM BROMIDE MONOHYDRATE 2.5 MCG/ACT IN AERS: 2.0000 | INHALATION_SPRAY | Freq: Every day | RESPIRATORY_TRACT | Status: DC

## 2023-11-27 MED ORDER — ONDANSETRON HCL 4 MG PO TABS
4.0000 mg | ORAL_TABLET | Freq: Four times a day (QID) | ORAL | Status: DC | PRN
Start: 2023-11-27 — End: 2023-12-09

## 2023-11-27 MED ORDER — GUAIFENESIN ER 600 MG PO TB12
600.0000 mg | ORAL_TABLET | Freq: Two times a day (BID) | ORAL | Status: DC
  Administered 2023-11-27 – 2023-12-08 (×22): 600 mg via ORAL
  Filled 2023-11-27 (×24): qty 1

## 2023-11-27 MED ORDER — ACETAMINOPHEN 650 MG RE SUPP: 650.0000 mg | Freq: Four times a day (QID) | RECTAL | Status: DC | PRN

## 2023-11-27 MED ORDER — ALLOPURINOL 100 MG PO TABS
100.0000 mg | ORAL_TABLET | Freq: Every day | ORAL | Status: DC
  Administered 2023-11-27 – 2023-11-28 (×2): 100 mg via ORAL
  Filled 2023-11-27 (×2): qty 1

## 2023-11-27 MED ORDER — PREGABALIN 75 MG PO CAPS
150.0000 mg | ORAL_CAPSULE | Freq: Three times a day (TID) | ORAL | Status: DC
  Administered 2023-11-27 – 2023-12-07 (×30): 150 mg via ORAL
  Filled 2023-11-27 (×14): qty 2
  Filled 2023-11-27 (×3): qty 6
  Filled 2023-11-27 (×13): qty 2

## 2023-11-27 MED ORDER — ALBUTEROL SULFATE (2.5 MG/3ML) 0.083% IN NEBU: 2.5000 mg | INHALATION_SOLUTION | RESPIRATORY_TRACT | Status: DC | PRN

## 2023-11-27 MED ORDER — MIRTAZAPINE 15 MG PO TABS
15.0000 mg | ORAL_TABLET | Freq: Every day | ORAL | Status: DC
  Administered 2023-11-27 – 2023-12-07 (×11): 15 mg via ORAL
  Filled 2023-11-27 (×11): qty 1

## 2023-11-27 MED ORDER — ONDANSETRON HCL 4 MG/2ML IJ SOLN
4.0000 mg | Freq: Four times a day (QID) | INTRAMUSCULAR | Status: DC | PRN
Start: 2023-11-27 — End: 2023-12-09

## 2023-11-27 MED ORDER — ACETAMINOPHEN 500 MG PO TABS
1000.0000 mg | ORAL_TABLET | Freq: Once | ORAL | Status: AC
  Administered 2023-11-27: 1000 mg via ORAL
  Filled 2023-11-27: qty 2

## 2023-11-27 MED ORDER — VANCOMYCIN HCL 2000 MG/400ML IV SOLN
2000.0000 mg | Freq: Once | INTRAVENOUS | Status: AC
  Administered 2023-11-27: 2000 mg via INTRAVENOUS
  Filled 2023-11-27: qty 400

## 2023-11-27 NOTE — H&P (Addendum)
 History and Physical    Patient: Lawrence [name] ZOX:[id number] DOB: [birth year] DOA: 11/27/2023 DOS: the patient was seen and examined on 11/27/2023 PCP: Darnelle Elders, PA-C  Patient coming from: [facility] via EMS  Chief Complaint:  Chief Complaint  Patient presents with   Level 2 FOT   Code Sepsis   HPI: SANAY BELMAR is a 68 y.o. male with medical history significant of COPD, severe aortic stenosis status post TAVR in July 2024, seizure disorder, chronic diastolic CHF (follows Dr. Mitzie Anda), atrial fibrillation on Eliquis , CVA, gout, anxiety, BPH, chronic venous stasis, and morbid obesity presents after having an unwitnessed fall out of his wheelchair.  Records note patient had just recently been hospitalized 3/14-3/19 for sepsis with acute respiratory failure with hypoxia secondary to influenza A with pneumonia and COPD exacerbation.  The patient, who normally uses a wheelchair for mobility, experienced a fall after leaning forward attempting to pick something off the floor with his pick-up stick and hit his head.  Denied any loss of consciousness.  He has had recent coughing and was aware of a sick contact, specifically one that lives across the hallway from him.   He reports increased shortness of breath recently and has also experienced leg swelling. He is currently taking Lasix  (furosemide ) for this condition.  He uses a CPAP machine for sleep apnea.  In the emergency department patient was noted to be febrile up to 101.9 F with tachypnea and blood pressures noted to be as low as 77/38 -108/61, and O2 saturations maintained on room air.  Labs including CBC, lactic acid, and high-sensitivity troponins were unremarkable.  Chest x-ray noted underinflation with enlarged cardiopericardial silhouette.  Urinalysis did not show significant signs for infection.  CT scan of the head head and cervical spine have been obtained which noted no acute intercranial abnormality hematoma  of the right side of the forehead, paranasal sinus disease, and multifactorial severe spinal canal stenosis at C3-4 as previously noted on MRI from 3/3.  COVID-19 screening was positive.  Blood cultures have been taken.  Patient has been given 2 L of lactated Ringer 's, acetaminophen  1000 mg p.o., vancomycin , and Zosyn   Review of Systems: As mentioned in the history of present illness. All other systems reviewed and are negative. Past Medical History:  Diagnosis Date   Acquired dilation of ascending aorta and aortic root (HCC)    40mm by echo 01/2021   Adenomatous colon polyp 2007   Anemia    Anxiety    Asthma    BPH without obstruction/lower urinary tract symptoms 02/22/2017   Chronic diastolic (congestive) heart failure (HCC)    Chronic venous stasis 03/07/2019   COPD (chronic obstructive pulmonary disease) (HCC)    Coronary artery calcification seen on CAT scan    Depression    Diabetic neuropathy (HCC) 09/11/2019   History of colon polyps 08/24/2018   Hypertension    Morbid obesity (HCC)    OSA (obstructive sleep apnea)    Pain due to onychomycosis of toenails of both feet 09/11/2019   Peripheral neuropathy 02/22/2017   Primary osteoarthritis, left shoulder 03/05/2017   PTSD (post-traumatic stress disorder)    Pure hypercholesterolemia    QT prolongation 03/07/2019   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 Lorie Rook and Dr. Sherene Dilling   Seizures [facility])    Severe aortic stenosis    Sinus tachycardia 03/07/2019   Sleep apnea    CPAP   Type  2 diabetes mellitus with vascular disease (HCC) 09/11/2019   Past Surgical History:  Procedure Laterality Date   ENDOVENOUS ABLATION SAPHENOUS VEIN W/ LASER Right 08/20/2020   endovenous laser ablation right greater saphenous vein by Kirtland Perfect MD    ENDOVENOUS ABLATION SAPHENOUS VEIN W/ LASER Left 11/16/2022   endovenous laser ablation left greater saphenous vein by Kirtland Perfect  MD   INTRAOPERATIVE TRANSTHORACIC ECHOCARDIOGRAM N/A 02/07/2023   Procedure: INTRAOPERATIVE TRANSTHORACIC ECHOCARDIOGRAM;  Surgeon: Kyra Phy, MD;  Location: [facility] INVASIVE CV LAB;  Service: Open Heart Surgery;  Laterality: N/A;   JOINT REPLACEMENT     left knee replacement x 2   KNEE ARTHROSCOPY Bilateral    LEFT HEART CATH AND CORONARY ANGIOGRAPHY N/A 01/17/2018   Procedure: LEFT HEART CATH AND CORONARY ANGIOGRAPHY;  Surgeon: Arleen Lacer, MD;  Location: [facility] INVASIVE CV LAB;  Service: Cardiovascular;  Laterality: N/A;   PRESSURE SENSOR/CARDIOMEMS N/A 08/26/2021   Procedure: PRESSURE SENSOR/CARDIOMEMS;  Surgeon: Darlis Eisenmenger, MD;  Location: [facility] INVASIVE CV LAB;  Service: Cardiovascular;  Laterality: N/A;   RIGHT HEART CATH N/A 08/26/2021   Procedure: RIGHT HEART CATH;  Surgeon: Darlis Eisenmenger, MD;  Location: [facility] INVASIVE CV LAB;  Service: Cardiovascular;  Laterality: N/A;   RIGHT HEART CATH AND CORONARY ANGIOGRAPHY N/A 01/26/2023   Procedure: RIGHT HEART CATH AND CORONARY ANGIOGRAPHY;  Surgeon: Kyra Phy, MD;  Location: [facility] INVASIVE CV LAB;  Service: Cardiovascular;  Laterality: N/A;   TEE WITHOUT CARDIOVERSION N/A 08/26/2021   Procedure: TRANSESOPHAGEAL ECHOCARDIOGRAM (TEE);  Surgeon: Darlis Eisenmenger, MD;  Location: [facility] ENDOSCOPY;  Service: Cardiovascular;  Laterality: N/A;   TOOTH EXTRACTION N/A 02/03/2023   Procedure: DENTAL RESTORATION/EXTRACTIONS;  Surgeon: Ascencion Lava, DMD;  Location: [facility] OR;  Service: Oral Surgery;  Laterality: N/A;   TRANSCATHETER AORTIC VALVE REPLACEMENT, TRANSFEMORAL Right 02/07/2023   Procedure: Transcatheter Aortic Valve Replacement, Transfemoral;  Surgeon: Kyra Phy, MD;  Location: [facility] INVASIVE CV LAB;  Service: Open Heart Surgery;  Laterality: Right;   UMBILICAL HERNIA REPAIR     Social History:  reports that he quit smoking about 7 years ago. His smoking use included cigarettes. He started smoking about 51 years ago. He has a 44 pack-year  smoking history. He has been exposed to tobacco smoke. He has never used smokeless tobacco. He reports current alcohol  use. He reports that he does not use drugs.  Allergies  Allergen Reactions   Niacin And Related Other (See Comments)    Redness, flushing   Ppd [Tuberculin Purified Protein Derivative]     Unknown rxn (MAR)   Tubersol [Tuberculin, Ppd] Other (See Comments)    Unknown rxn (MAR)   Firvanq  [Vancomycin ] Other (See Comments)    Red man syndrome   Vibramycin  [Doxycycline ] Rash    Family History  Problem Relation Age of Onset   CAD Maternal Grandfather    Diabetes Other    Diabetes Mellitus II Neg Hx    Colon cancer Neg Hx    Esophageal cancer Neg Hx    Inflammatory bowel disease Neg Hx    Liver disease Neg Hx    Pancreatic cancer Neg Hx    Rectal cancer Neg Hx    Stomach cancer Neg Hx    Sleep apnea Neg Hx     Prior to Admission medications   Medication Sig Start Date End Date Taking? Authorizing Provider  traZODone  (DESYREL ) 100 MG tablet TAKE 2 TABLETS BY MOUTH AT BEDTIME 11/09/23   Mozingo, Regina Nattalie, NP  acetaminophen  (TYLENOL ) 650 MG CR tablet Take 650 mg by mouth every 8 (eight) hours as needed for pain.    [provider]  albuterol  (VENTOLIN  HFA) 108 (90 Base) MCG/ACT inhaler Inhale 2 puffs into the lungs every 6 (six) hours as needed for wheezing or shortness of breath. 04/17/23   Hunsucker, Archer Kobs, MD  allopurinol  (ZYLOPRIM ) 100 MG tablet Take 1 tablet (100 mg total) by mouth daily. 07/25/23 07/24/24  Elgergawy, Ardia Kraft, MD  apixaban  (ELIQUIS ) 5 MG TABS tablet Take 1 tablet (5 mg total) by mouth 2 (two) times daily. 04/17/23   Darlis Eisenmenger, MD  ARIPiprazole  (ABILIFY ) 5 MG tablet TAKE 1 TABLET BY MOUTH EVERYDAY AT BEDTIME 10/03/23   Mozingo, Regina Nattalie, NP  atorvastatin  (LIPITOR) 40 MG tablet Take 40 mg by mouth at bedtime. 07/14/23   [provider]  bisacodyl  (DULCOLAX) 5 MG EC tablet Take 10 mg by mouth daily as needed for  moderate constipation.    [provider]  busPIRone  (BUSPAR ) 10 MG tablet Take 1 tablet (10 mg total) by mouth 3 (three) times daily. 04/17/23   Mozingo, Regina Nattalie, NP  colchicine  0.6 MG tablet Take 1 tablet (0.6 mg total) by mouth daily. 07/12/23   Love, Renay Carota, PA-C  cyanocobalamin  (VITAMIN B12) 1000 MCG tablet Take 1,000 mcg by mouth daily.    [provider]  fenofibrate  (TRICOR ) 145 MG tablet Take 1 tablet (145 mg total) by mouth daily. 04/17/23   Darlis Eisenmenger, MD  ferrous sulfate  325 (65 FE) MG tablet Take 325 mg by mouth daily.    [provider]  fluticasone  (FLONASE ) 50 MCG/ACT nasal spray Place 2 sprays into both nostrils daily.     [provider]  Fluticasone -Umeclidin-Vilant (TRELEGY ELLIPTA ) 200-62.5-25 MCG/ACT AEPB INHALE 1 PUFF BY MOUTH INTO LUNGS ONCE DAILY Patient not taking: Reported on 10/20/2023 08/18/22   Hunsucker, Archer Kobs, MD  guaiFENesin -dextromethorphan (ROBITUSSIN DM) 100-10 MG/5ML syrup Take 5 mLs by mouth every 4 (four) hours as needed for cough. 10/25/23   Rai, Hurman Maiden, MD  HYDROcodone -acetaminophen  (NORCO/VICODIN) 5-325 MG tablet Take 1 tablet by mouth every 6 (six) hours as needed for moderate pain (pain score 4-6) or severe pain (pain score 7-10). 10/25/23   Rai, Hurman Maiden, MD  icosapent  Ethyl (VASCEPA ) 1 g capsule TAKE 2 CAPSULES BY MOUTH TWICE A DAY 10/12/23   Darlis Eisenmenger, MD  insulin  aspart (NOVOLOG ) 100 UNIT/ML injection Inject 0-15 Units into the skin 3 (three) times daily with meals. Sliding scale  CBG 70 - 120: 0 units: CBG 121 - 150: 2 units; CBG 151 - 200: 3 units; CBG 201 - 250: 5 units; CBG 251 - 300: 8 units;CBG 301 - 350: 11 units; CBG 351 - 400: 15 units; CBG > 400 : 15 units and notify MD 10/25/23   Rai, Hurman Maiden, MD  Lacosamide  150 MG TABS Take 1 tablet (150 mg total) by mouth 2 (two) times daily. 10/10/23   Verlyn Goad, MD  levETIRAcetam  (KEPPRA ) 500 MG tablet Take 1 tablet (500 mg total) by mouth 2  (two) times daily. 05/04/23   Camara, Amadou, MD  Menthol -Methyl Salicylate  (MUSCLE RUB) 10-15 % CREA Apply 1 Application topically as needed for muscle pain. Apply 4gm to knees.    [provider]  methocarbamol  (ROBAXIN ) 500 MG tablet Take 500 mg by mouth every 8 (eight) hours as needed for muscle spasms.    [provider]  metolazone  (ZAROXOLYN ) 2.5 MG  tablet Take 2.5 mg by mouth once a week. Every Tuesday. 09/20/23   [provider]  metoprolol  succinate (TOPROL -XL) 25 MG 24 hr tablet Take 1 tablet (25 mg total) by mouth daily. Take with or immediately following a meal. 10/25/23   Rai, Ripudeep K, MD  mirtazapine  (REMERON ) 15 MG tablet Take 15 mg by mouth at bedtime. 07/14/23   [provider]  mupirocin  ointment (BACTROBAN ) 2 % Apply 1 Application topically 2 (two) times daily. Patient not taking: Reported on 10/20/2023 09/20/23   [provider]  omeprazole (PRILOSEC) 20 MG capsule Take 20 mg by mouth daily. 05/29/20   [provider]  polyethylene glycol powder (GLYCOLAX /MIRALAX ) 17 GM/SCOOP powder Mix in water as directed and take 1 capful (17 g) by mouth daily. 07/12/23   Love, Renay Carota, PA-C  potassium chloride  SA (KLOR-CON  M20) 20 MEQ tablet Take 1-2 tablets (20-40 mEq total) by mouth See admin instructions. Take 2 tablets in the morning and 1 tablet in the evening Patient taking differently: Take 20-40 mEq by mouth See admin instructions. Take 2 tablets (40mEq) in the morning and 1 tablet ( ) in the evening 10/10/23   Verlyn Goad, MD  prazosin  (MINIPRESS ) 2 MG capsule Take 2 mg by mouth at bedtime. 08/17/23   [provider]  pregabalin  (LYRICA ) 150 MG capsule Take 1 capsule (150 mg total) by mouth 3 (three) times daily. 10/10/23   Verlyn Goad, MD  repaglinide  (PRANDIN ) 2 MG tablet Take 2 mg by mouth 2 (two) times daily. 07/24/23   [provider]  rOPINIRole  (REQUIP ) 0.25 MG tablet Take 1 tablet (0.25 mg total) by  mouth 2 (two) times daily. 07/25/23   Elgergawy, Ardia Kraft, MD  sennosides-docusate sodium  (SENOKOT-S) 8.6-50 MG tablet Take 2 tablets by mouth in the morning and at bedtime.    [provider]  spironolactone  (ALDACTONE ) 25 MG tablet Take 12.5 mg by mouth daily. 09/01/23   [provider]  Tiotropium Bromide  Monohydrate (SPIRIVA  RESPIMAT) 2.5 MCG/ACT AERS Inhale 2 puffs into the lungs daily.    [provider]  tirzepatide  (MOUNJARO ) 15 MG/0.5ML Pen INJECT 15 MG INTO THE SKIN ONCE A WEEK. Patient taking differently: Inject 15 mg into the skin once a week. Inject on Wednesday 06/07/23   Motwani, Komal, MD  torsemide  (DEMADEX ) 20 MG tablet Take 3 tablets (60 mg total) by mouth 2 (two) times daily. 07/25/23   Elgergawy, Ardia Kraft, MD  venlafaxine  XR (EFFEXOR -XR) 150 MG 24 hr capsule Take 150 mg by mouth daily with breakfast.    [provider]    Physical Exam: Vitals:   11/27/23 1330 11/27/23 1345 11/27/23 1400 11/27/23 1415  BP: (!) 100/42 (!) 96/55 99/60 (!) 88/57  Pulse: (!) 44 (!) 44 (!) 44 (!) 45  Resp: 17 (!) 21 18 16   Temp:      TempSrc:      SpO2: (!) 89% 100% 94% 93%  Weight:      Height:       Constitutional: Morbidly obese male who appears to be lethargic but will awaken and answer questions Eyes: PERRL, lids and conjunctivae   ENMT: Mucous membranes are moist.  Normal dentition.  Neck: normal, supple,  Respiratory: Decreased overall aeration with no significant wheezes appreciated at this time. Cardiovascular: Regular rate with extra beats appreciated. No extremity edema. 2+ pedal pulses.   Abdomen: no tenderness, no masses palpated. No hepatosplenomegaly. Bowel sounds positive.  Musculoskeletal: no clubbing / cyanosis. No joint  deformity upper and lower extremities. Good ROM, no contractures. Normal muscle tone.  Skin: Patient appreciated of the right side of the forehead with surrounding bruising. Neurologic: CN 2-12 grossly intact.  Able  to move all extremities. Psychiatric: Normal judgment and insight.  Lethargic, but oriented x 3.  Data Reviewed:  EKG reveals sinus rhythm at 91 bpm with multiple preventricular complexes.  Reviewed labs, imaging, and pertinent records as documented.   Assessment and Plan:  SIRS COVID-19 infection Patient noted to be febrile up to 101.9 F while in the ED with tachypnea meeting SIRS criteria and hypotension.  White blood cell count was within limits and lactic acid was noted to be reassuring at 1.8->0.8.  Initial chest x-ray noted reinflation with enlarged cardiopericardial silhouette.  Blood cultures were obtained and patient was started on antibiotics and vancomycin  and Zosyn .  COVID-19 screening was noted to be positive and patient did admit to having recent sick contact.  At this time patient had just recently been treated for COPD exacerbation with influenza A with pneumonia last month.  CT scan of the chest abdomen and pelvis revealed no acute abnormality. - Admit to a progressive bed -COVID-19 order set utilized - Follow-up blood cultures - Add on procalcitonin - Incentive spirometry and flutter valve - Patient not a candidate for Paxlovid due to being on Eliquis  and is not currently requiring oxygen - Reassess and determine need of continuation of antibiotics - Mucinex   Hypotension Acute.  Initial blood pressures noted to be as low as 77/38.  Suspect secondary to acute infection.  Blood pressures improved with IV fluids. - Goal MAP greater than 65 - Hold home blood pressure regimen at this time.  Reassess and determine when medically appropriate to resume  Fall out of wheelchair CT scan of the head and cervical spine did not note any acute abnormality and chronic changes as previously noted on previous MRI imaging from 3/3. - PT/OT to evaluate and treat  Diabetes mellitus type 2, with long-term use of insulin  On admission glucose noted to be 196.  Last available hemoglobin A1c  noted to be 6.6 on 10/04/2023.  Patient is on sliding scale insulin , Prandin , and Mounjaro . - Hypoglycemia protocol - Continue Prandin  - CBGs before every meal with sensitive SSI  COPD, without acute exacerbation at this time Patient does not appear to have active wheezing on physical exam although decreased overall aeration. -Albuterol  nebs as needed for shortness of breath/wheezing   Paroxysmal atrial fibrillation on chronic anticoagulation Patient appears to be in sinus rhythm at this time. - Continue Eliquis   Diastolic congestive heart failure Chronic.  Patient appears to be euvolemic at this time.  Last echocardiogram noted EF to be 50 to 55% with indeterminate diastolic parameters when checked in 06/2023. - Strict I&Os and daily weights - Determine when medically appropriate to resume diuretics on hold due to hypotension  Status post TAVR No acute issues noted per last echocardiogram from 06/2023.  Seizure disorder - Continue Keppra  and lacosamide    Hyperlipidemia - Continue fenofibrate  and atorvastatin   Anxiety and depression - Continue trazodone , mirtazapine , buspirone , venlafaxine , and aripiprazole   Morbid obesity OSA on CPAP BMI 46.22 kg/m -Continue CPAP nightly   Advance Care Planning:   Code Status: Do not attempt resuscitation (DNR) PRE-ARREST INTERVENTIONS DESIRED    Consults: None  Family Communication:  wife updated over the phone.  Severity of Illness: The appropriate patient status for this patient is OBSERVATION. Observation status is judged to be reasonable and necessary in  order to provide the required intensity of service to ensure the patient's safety. The patient's presenting symptoms, physical exam findings, and initial radiographic and laboratory data in the context of their medical condition is felt to place them at decreased risk for further clinical deterioration. Furthermore, it is anticipated that the patient will be medically stable for  discharge from the hospital within 2 midnights of admission.   Author: Lena Qualia, MD 11/27/2023 2:52 PM  For on call review [email or web address].

## 2023-11-27 NOTE — Sepsis Progress Note (Signed)
 Per provider, using ideal body weight for fluid calculations for sepsis protocol

## 2023-11-27 NOTE — Progress Notes (Signed)
 Patient to 5W06 at this time

## 2023-11-27 NOTE — Progress Notes (Signed)
 ED Pharmacy Antibiotic Sign Off An antibiotic consult was received from an ED provider for vancomycin  and Zosyn  per pharmacy dosing for sepsis. A chart review was completed to assess appropriateness.   The following one time order(s) were placed:  Vancomycin  2000 mg IV x1 Zosyn  3.375 gm IV x1  Further antibiotic and/or antibiotic pharmacy consults should be ordered by the admitting provider if indicated.   Thank you for allowing pharmacy to be a part of this patient's care.   Volney Grumbles, PharmD PGY-1 Acute Care Pharmacy Resident 11/27/2023 10:16 AM

## 2023-11-27 NOTE — ED Notes (Signed)
 Trauma Response Nurse Documentation  Lawrence [name] is a 68 y.o. male arriving to [facility] ED via EMS  On Eliquis  (apixaban ) daily. Trauma was activated as a Level 2 based on the following trauma criteria Elderly patients > 65 with head trauma on anti-coagulation (excluding ASA).  Patient cleared for CT by Dr. Isaiah Marc. Pt transported to CT with trauma response nurse present to monitor. RN remained with the patient throughout their absence from the department for clinical observation. GCS 15.  History   Past Medical History:  Diagnosis Date   Acquired dilation of ascending aorta and aortic root (HCC)    40mm by echo 01/2021   Adenomatous colon polyp 2007   Anemia    Anxiety    Asthma    BPH without obstruction/lower urinary tract symptoms 02/22/2017   Chronic diastolic (congestive) heart failure (HCC)    Chronic venous stasis 03/07/2019   COPD (chronic obstructive pulmonary disease) (HCC)    Coronary artery calcification seen on CAT scan    Depression    Diabetic neuropathy (HCC) 09/11/2019   History of colon polyps 08/24/2018   Hypertension    Morbid obesity (HCC)    OSA (obstructive sleep apnea)    Pain due to onychomycosis of toenails of both feet 09/11/2019   Peripheral neuropathy 02/22/2017   Primary osteoarthritis, left shoulder 03/05/2017   PTSD (post-traumatic stress disorder)    Pure hypercholesterolemia    QT prolongation 03/07/2019   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 Lorie Rook and Dr. Sherene Dilling   Seizures [facility])    Severe aortic stenosis    Sinus tachycardia 03/07/2019   Sleep apnea    CPAP   Type 2 diabetes mellitus with vascular disease (HCC) 09/11/2019     Past Surgical History:  Procedure Laterality Date   ENDOVENOUS ABLATION SAPHENOUS VEIN W/ LASER Right 08/20/2020   endovenous laser ablation right greater saphenous vein by Kirtland Perfect MD    ENDOVENOUS ABLATION SAPHENOUS VEIN W/ LASER  Left 11/16/2022   endovenous laser ablation left greater saphenous vein by Kirtland Perfect MD   INTRAOPERATIVE TRANSTHORACIC ECHOCARDIOGRAM N/A 02/07/2023   Procedure: INTRAOPERATIVE TRANSTHORACIC ECHOCARDIOGRAM;  Surgeon: Thukkani, Arun K, MD;  Location: [facility] INVASIVE CV LAB;  Service: Open Heart Surgery;  Laterality: N/A;   JOINT REPLACEMENT     left knee replacement x 2   KNEE ARTHROSCOPY Bilateral    LEFT HEART CATH AND CORONARY ANGIOGRAPHY N/A 01/17/2018   Procedure: LEFT HEART CATH AND CORONARY ANGIOGRAPHY;  Surgeon: Arleen Lacer, MD;  Location: [facility] INVASIVE CV LAB;  Service: Cardiovascular;  Laterality: N/A;   PRESSURE SENSOR/CARDIOMEMS N/A 08/26/2021   Procedure: PRESSURE SENSOR/CARDIOMEMS;  Surgeon: Darlis Eisenmenger, MD;  Location[facility] INVASIVE CV LAB;  Service: Cardiovascular;  Laterality: N/A;   RIGHT HEART CATH N/A 08/26/2021   Procedure: RIGHT HEART CATH;  Surgeon: Darlis Eisenmenger, MD;  Location: [facility] INVASIVE CV LAB;  Service: Cardiovascular;  Laterality: N/A;   RIGHT HEART CATH AND CORONARY ANGIOGRAPHY N/A 01/26/2023   Procedure: RIGHT HEART CATH AND CORONARY ANGIOGRAPHY;  Surgeon: Kyra Phy, MD;  Location: [facility] INVASIVE CV LAB;  Service: Cardiovascular;  Laterality: N/A;   TEE WITHOUT CARDIOVERSION N/A 08/26/2021   Procedure: TRANSESOPHAGEAL ECHOCARDIOGRAM (TEE);  Surgeon: Darlis Eisenmenger, MD;  Location: [facility] ENDOSCOPY;  Service: Cardiovascular;  Laterality: N/A;   TOOTH EXTRACTION N/A 02/03/2023   Procedure: DENTAL RESTORATION/EXTRACTIONS;  Surgeon: Ascencion Lava, DMD;  Location: [facility] OR;  Service: Oral Surgery;  Laterality: N/A;   TRANSCATHETER AORTIC VALVE REPLACEMENT, TRANSFEMORAL Right 02/07/2023   Procedure: Transcatheter Aortic Valve Replacement, Transfemoral;  Surgeon: Kyra Phy, MD;  Location: [facility] INVASIVE CV LAB;  Service: Open Heart Surgery;  Laterality: Right;   UMBILICAL HERNIA REPAIR       Initial Focused Assessment (If applicable, or please see trauma  documentation): Patient A&Ox4, GCS 15, PERR 3 Airway intact, bilateral breath sounds Pulses 1+  CT's Completed:   CT Head, CT C-Spine, CT Chest w/ contrast, and CT abdomen/pelvis w/ contrast   Interventions:  IV, labs CXR CT Head/Cspine/C/A/P Blood cultures Abx  Plan for disposition:  Admission to floor   Event Summary: Patient to ED after falling from wheelchair, on Eliquis  for afib. Imaging was completed and revealed no traumatic injury. Patient subsequently tested positive for covid. Patient to be admitted to inpatient unit for management of respiratory illness.  Bedside handoff with ED RN Germain Kohler.    Henri Loft Ouida Abeyta  Trauma Response RN  Please call TRN at [phone] for further assistance.

## 2023-11-27 NOTE — Progress Notes (Signed)
   11/27/23 0951  Spiritual Encounters  Type of Visit Initial  Care provided to: Patient  Conversation partners present during encounter Nurse  Reason for visit Routine spiritual support   Chaplain responding to Trauma 2, Fall On Thinners.  Pt was alert and oriented, speaking with the medical team.  When I asked, Pt stated he was unsure if his wife was coming to the ED or not.  He did not desire any further spiritual care at this time.  Chaplain services remain available by Spiritual Consult or for emergent cases, paging [phone]  Chaplain Lawrence [name], MDiv [name] [phone]

## 2023-11-27 NOTE — Sepsis Progress Note (Signed)
 Elink monitoring for the code sepsis protocol.

## 2023-11-27 NOTE — ED Triage Notes (Signed)
 Pt BIB [facility] from [facility] d/t unwitnessed fall out of his w/c when reaching for something in the floor. Is A/Ox4, denies LOC, did hit his forehead on the floor, takes Eliquis  with Hx of COPD, CHF & recent stroke with Lt sided deficit at baseline. Was Dx recently with PNE & has had recent covid exposure (per EMS) & upon their arrival was 100.9 temp. 140/70, resp 26, Cap 26, 80-110bpm with PVCs.

## 2023-11-27 NOTE — ED Notes (Signed)
 X-ray at bedside

## 2023-11-27 NOTE — Sepsis Progress Note (Signed)
Notified provider and bedside nurse of need to order and administer antibiotics. 

## 2023-11-27 NOTE — ED Provider Notes (Signed)
 [place] EMERGENCY DEPARTMENT AT [facility] Provider Note   CSN: [id number] Arrival date & time: 11/27/23  4098     History  Chief Complaint  Patient presents with   Level 2 FOT   Code Sepsis    Lawrence [name] is a 68 y.o. male with extensive past medical history as listed below presents via EMS following an unwitnessed fall at his facility.  Patient reports that he was leaning down to pick something while sitting in his wheelchair and fell and hit his head.  Did not lose consciousness.  Patient is on Eliquis  following a recent CVA proximately 6 weeks ago.  Patient does endorse that he was feeling dizzy prior to the fall.  No associated chest pain or shortness of breath.  He denies any cough, vomiting or diarrhea.  No dysuria or increased frequency.  He does note that yesterday he started feeling generally unwell.  HPI  Past Medical History:  Diagnosis Date   Acquired dilation of ascending aorta and aortic root (HCC)    40mm by echo 01/2021   Adenomatous colon polyp 2007   Anemia    Anxiety    Asthma    BPH without obstruction/lower urinary tract symptoms 02/22/2017   Chronic diastolic (congestive) heart failure (HCC)    Chronic venous stasis 03/07/2019   COPD (chronic obstructive pulmonary disease) (HCC)    Coronary artery calcification seen on CAT scan    Depression    Diabetic neuropathy (HCC) 09/11/2019   History of colon polyps 08/24/2018   Hypertension    Morbid obesity (HCC)    OSA (obstructive sleep apnea)    Pain due to onychomycosis of toenails of both feet 09/11/2019   Peripheral neuropathy 02/22/2017   Primary osteoarthritis, left shoulder 03/05/2017   PTSD (post-traumatic stress disorder)    Pure hypercholesterolemia    QT prolongation 03/07/2019   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 Lorie Rook and Dr. Sherene Dilling   Seizures [facility])    Severe aortic stenosis    Sinus tachycardia  03/07/2019   Sleep apnea    CPAP   Type 2 diabetes mellitus with vascular disease (HCC) 09/11/2019      Home Medications Prior to Admission medications   Medication Sig Start Date End Date Taking? Authorizing Provider  traZODone  (DESYREL ) 100 MG tablet TAKE 2 TABLETS BY MOUTH AT BEDTIME 11/09/23   Mozingo, Regina Nattalie, NP  acetaminophen  (TYLENOL ) 650 MG CR tablet Take 650 mg by mouth every 8 (eight) hours as needed for pain.    [provider]  albuterol  (VENTOLIN  HFA) 108 (90 Base) MCG/ACT inhaler Inhale 2 puffs into the lungs every 6 (six) hours as needed for wheezing or shortness of breath. 04/17/23   Hunsucker, Archer Kobs, MD  allopurinol  (ZYLOPRIM ) 100 MG tablet Take 1 tablet (100 mg total) by mouth daily. 07/25/23 07/24/24  Elgergawy, Ardia Kraft, MD  apixaban  (ELIQUIS ) 5 MG TABS tablet Take 1 tablet (5 mg total) by mouth 2 (two) times daily. 04/17/23   Darlis Eisenmenger, MD  ARIPiprazole  (ABILIFY ) 5 MG tablet TAKE 1 TABLET BY MOUTH EVERYDAY AT BEDTIME 10/03/23   Mozingo, Regina Nattalie, NP  atorvastatin  (LIPITOR ) 40 MG tablet Take 40 mg by mouth at bedtime. 07/14/23   [provider]  bisacodyl  (DULCOLAX) 5 MG EC tablet Take 10 mg by mouth daily as needed for moderate constipation.    [provider]  busPIRone  (BUSPAR ) 10 MG tablet  Take 1 tablet (10 mg total) by mouth 3 (three) times daily. 04/17/23   Mozingo, Regina Nattalie, NP  colchicine  0.6 MG tablet Take 1 tablet (0.6 mg total) by mouth daily. 07/12/23   Love, Renay Carota, PA-C  cyanocobalamin  (VITAMIN B12) 1000 MCG tablet Take 1,000 mcg by mouth daily.    [provider]  fenofibrate  (TRICOR ) 145 MG tablet Take 1 tablet (145 mg total) by mouth daily. 04/17/23   Darlis Eisenmenger, MD  ferrous sulfate  325 (65 FE) MG tablet Take 325 mg by mouth daily.    [provider]  fluticasone  (FLONASE ) 50 MCG/ACT nasal spray Place 2 sprays into both nostrils daily.     [provider]   Fluticasone -Umeclidin-Vilant (TRELEGY ELLIPTA ) 200-62.5-25 MCG/ACT AEPB INHALE 1 PUFF BY MOUTH INTO LUNGS ONCE DAILY Patient not taking: Reported on 10/20/2023 08/18/22   Hunsucker, Archer Kobs, MD  guaiFENesin -dextromethorphan (ROBITUSSIN DM) 100-10 MG/5ML syrup Take 5 mLs by mouth every 4 (four) hours as needed for cough. 10/25/23   Rai, Hurman Maiden, MD  HYDROcodone -acetaminophen  (NORCO/VICODIN) 5-325 MG tablet Take 1 tablet by mouth every 6 (six) hours as needed for moderate pain (pain score 4-6) or severe pain (pain score 7-10). 10/25/23   Rai, Hurman Maiden, MD  icosapent  Ethyl (VASCEPA ) 1 g capsule TAKE 2 CAPSULES BY MOUTH TWICE A DAY 10/12/23   Darlis Eisenmenger, MD  insulin  aspart (NOVOLOG ) 100 UNIT/ML injection Inject 0-15 Units into the skin 3 (three) times daily with meals. Sliding scale  CBG 70 - 120: 0 units: CBG 121 - 150: 2 units; CBG 151 - 200: 3 units; CBG 201 - 250: 5 units; CBG 251 - 300: 8 units;CBG 301 - 350: 11 units; CBG 351 - 400: 15 units; CBG > 400 : 15 units and notify MD 10/25/23   Rai, Hurman Maiden, MD  Lacosamide  150 MG TABS Take 1 tablet (150 mg total) by mouth 2 (two) times daily. 10/10/23   Verlyn Goad, MD  levETIRAcetam  (KEPPRA ) 500 MG tablet Take 1 tablet (500 mg total) by mouth 2 (two) times daily. 05/04/23   Camara, Amadou, MD  Menthol -Methyl Salicylate  (MUSCLE RUB) 10-15 % CREA Apply 1 Application topically as needed for muscle pain. Apply 4gm to knees.    [provider]  methocarbamol  (ROBAXIN ) 500 MG tablet Take 500 mg by mouth every 8 (eight) hours as needed for muscle spasms.    [provider]  metolazone  (ZAROXOLYN ) 2.5 MG tablet Take 2.5 mg by mouth once a week. Every Tuesday. 09/20/23   [provider]  metoprolol  succinate (TOPROL -XL) 25 MG 24 hr tablet Take 1 tablet (25 mg total) by mouth daily. Take with or immediately following a meal. 10/25/23   Rai, Ripudeep K, MD  mirtazapine  (REMERON ) 15 MG tablet Take 15 mg by mouth at bedtime. 07/14/23    [provider]  mupirocin  ointment (BACTROBAN ) 2 % Apply 1 Application topically 2 (two) times daily. Patient not taking: Reported on 10/20/2023 09/20/23   [provider]  omeprazole (PRILOSEC) 20 MG capsule Take 20 mg by mouth daily. 05/29/20   [provider]  polyethylene glycol powder (GLYCOLAX /MIRALAX ) 17 GM/SCOOP powder Mix in water as directed and take 1 capful (17 g) by mouth daily. 07/12/23   Love, Renay Carota, PA-C  potassium chloride  SA (KLOR-CON  M20) 20 MEQ tablet Take 1-2 tablets (20-40 mEq total) by mouth See admin instructions. Take 2 tablets in the morning and 1 tablet in the evening Patient taking differently: Take  20-40 mEq by mouth See admin instructions. Take 2 tablets (40mEq) in the morning and 1 tablet ( ) in the evening 10/10/23   Verlyn Goad, MD  prazosin  (MINIPRESS ) 2 MG capsule Take 2 mg by mouth at bedtime. 08/17/23   [provider]  pregabalin  (LYRICA ) 150 MG capsule Take 1 capsule (150 mg total) by mouth 3 (three) times daily. 10/10/23   Verlyn Goad, MD  repaglinide  (PRANDIN ) 2 MG tablet Take 2 mg by mouth 2 (two) times daily. 07/24/23   [provider]  rOPINIRole  (REQUIP ) 0.25 MG tablet Take 1 tablet (0.25 mg total) by mouth 2 (two) times daily. 07/25/23   Elgergawy, Ardia Kraft, MD  sennosides-docusate sodium  (SENOKOT-S) 8.6-50 MG tablet Take 2 tablets by mouth in the morning and at bedtime.    [provider]  spironolactone  (ALDACTONE ) 25 MG tablet Take 12.5 mg by mouth daily. 09/01/23   [provider]  Tiotropium Bromide  Monohydrate (SPIRIVA  RESPIMAT) 2.5 MCG/ACT AERS Inhale 2 puffs into the lungs daily.    [provider]  tirzepatide  (MOUNJARO ) 15 MG/0.5ML Pen INJECT 15 MG INTO THE SKIN ONCE A WEEK. Patient taking differently: Inject 15 mg into the skin once a week. Inject on Wednesday 06/07/23   Motwani, Komal, MD  torsemide  (DEMADEX ) 20 MG tablet Take 3 tablets (60 mg total) by mouth 2 (two)  times daily. 07/25/23   Elgergawy, Ardia Kraft, MD  venlafaxine  XR (EFFEXOR -XR) 150 MG 24 hr capsule Take 150 mg by mouth daily with breakfast.    [provider]      Allergies    Niacin and related; Ppd [tuberculin purified protein derivative]; Tubersol [tuberculin, ppd]; Firvanq  [vancomycin ]; and Vibramycin  [doxycycline ]    Review of Systems   Review of Systems  Neurological:  Positive for headaches.    Physical Exam Updated Vital Signs BP (!) 88/57   Pulse (!) 45   Temp 98.4 F (36.9 C) (Oral)   Resp 16   Ht 6\' 2"  (1.88 m)   Wt (!) 163.3 kg   SpO2 93%   BMI 46.22 kg/m  Physical Exam Vitals and nursing note reviewed.  Constitutional:      General: He is not in acute distress.    Appearance: He is well-developed.  HENT:     Head:     Comments: Superficial abrasion on right forehead with mild surrounding ecchymosis, mild associated tenderness, no step-offs Eyes:     Conjunctiva/sclera: Conjunctivae normal.  Neck:     Comments: No midline cervical tenderness Cardiovascular:     Rate and Rhythm: Normal rate and regular rhythm.     Heart sounds: No murmur heard. Pulmonary:     Effort: Pulmonary effort is normal. No respiratory distress.     Breath sounds: Normal breath sounds.  Abdominal:     Palpations: Abdomen is soft.     Tenderness: There is no abdominal tenderness.  Musculoskeletal:        General: No swelling.     Cervical back: Neck supple.  Skin:    General: Skin is warm and dry.     Capillary Refill: Capillary refill takes less than 2 seconds.  Neurological:     Mental Status: He is alert.     Comments: Patient is alert and oriented. There is no abnormal phonation. Symmetric smile without facial droop. Moves all extremities spontaneously. 5/5 strength in upper and lower extremities. . No sensation deficit. There is no nystagmus. EOMI, PERRL. Coordination intact with finger to nose.  Psychiatric:        Mood and Affect: Mood normal.     ED  Results / Procedures / Treatments   Labs (all labs ordered are listed, but only abnormal results are displayed) Labs Reviewed  RESP PANEL BY RT-PCR (RSV, FLU A&B, COVID)  RVPGX2 - Abnormal; Notable for the following components:      Result Value   SARS Coronavirus 2 by RT PCR POSITIVE (*)    All other components within normal limits  COMPREHENSIVE METABOLIC PANEL WITH GFR - Abnormal; Notable for the following components:   Glucose, Bld 196 (*)    Calcium  8.7 (*)    Total Protein 6.1 (*)    Albumin 3.3 (*)    All other components within normal limits  CBC WITH DIFFERENTIAL/PLATELET - Abnormal; Notable for the following components:   Lymphs Abs 0.6 (*)    All other components within normal limits  URINALYSIS, W/ REFLEX TO CULTURE (INFECTION SUSPECTED) - Abnormal; Notable for the following components:   Glucose, UA 50 (*)    Bacteria, UA RARE (*)    All other components within normal limits  I-STAT CHEM 8, ED - Abnormal; Notable for the following components:   Glucose, Bld 192 (*)    Calcium , Ion 1.12 (*)    All other components within normal limits  CULTURE, BLOOD (ROUTINE X 2)  CULTURE, BLOOD (ROUTINE X 2)  PROTIME-INR  I-STAT CG4 LACTIC ACID, ED  I-STAT CG4 LACTIC ACID, ED  TROPONIN I (HIGH SENSITIVITY)  TROPONIN I (HIGH SENSITIVITY)    EKG EKG Interpretation Date/Time:  Monday November 27 2023 10:23:26 EDT Ventricular Rate:  91 PR Interval:  253 QRS Duration:  116 QT Interval:  369 QTC Calculation: 454 R Axis:   -28  Text Interpretation: Sinus rhythm Multiple ventricular premature complexes Prolonged PR interval Nonspecific intraventricular conduction delay Low voltage, extremity and precordial leads Confirmed by Hiawatha Lout ([id number]) on 11/27/2023 12:46:53 PM  Radiology CT Head Wo Contrast Result Date: 11/27/2023 CLINICAL DATA:  Provided history: Head trauma, GCS = 15, no focal neuro findings. Neck trauma. Additional history provided: Fall on blood thinners. EXAM: CT  HEAD WITHOUT CONTRAST CT CERVICAL SPINE WITHOUT CONTRAST TECHNIQUE: Multidetector CT imaging of the head and cervical spine was performed following the standard protocol without intravenous contrast. Multiplanar CT image reconstructions of the cervical spine were also generated. RADIATION DOSE REDUCTION: This exam was performed according to the departmental dose-optimization program which includes automated exposure control, adjustment of the mA and/or kV according to patient size and/or use of iterative reconstruction technique. COMPARISON:  Prior head CT examinations 10/10/2023 and earlier. Cervical spine MRI 10/09/2023. FINDINGS: CT HEAD FINDINGS Brain: Generalized cerebral atrophy. There is no acute intracranial hemorrhage. No demarcated cortical infarct. No extra-axial fluid collection. No evidence of an intracranial mass. No midline shift. Vascular: No hyperdense vessel.  Atherosclerotic calcifications. Skull: No calvarial fracture or aggressive osseous lesion. Sinuses/Orbits: No mass or acute finding within the imaged orbits. Minimal mucosal thickening within the bilateral maxillary sinuses. Trace mucosal thickening within the right sphenoid sinus. Mild-to-moderate mucosal thickening within the left sphenoid sinus. Mild-to-moderate bilateral ethmoid sinusitis. Mild-to-moderate mucosal thickening within the left frontal sinus. Other: Forehead hematoma on the right. CT CERVICAL SPINE FINDINGS Alignment: Levocurvature of the cervical spine. 3 mm grade 1 anterolisthesis at C2-C3, C3-C4 and C4-C5. Skull base and vertebrae: The basion-dental and atlanto-dental intervals are maintained.No evidence of acute fracture to the cervical spine. 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 Soft tissues and  spinal canal: No prevertebral fluid or swelling. No visible canal hematoma. Disc levels: Cervical spondylosis with multilevel disc space narrowing, disc bulges/central disc protrusions, posterior disc osteophyte complexes,  uncovertebral hypertrophy and facet arthropathy. Erosions are noted along the left C3-C4 facet joint. Disc degeneration is greatest at C5-C6, C6-C7 and C7-T1 (advanced at these levels). Multilevel spinal canal narrowing. Most notably at C3-C4, a disc bulge contributes to multifactorial severe spinal canal stenosis (as was demonstrated on the recent prior cervical spine MRI of 10/09/2023). Multilevel bony foraminal stenosis. Degenerative changes also present at the C1-C2 articulation. Upper chest: No consolidation within the imaged lung apices. No visible pneumothorax. IMPRESSION: CT head: 1.  No evidence of an acute intracranial abnormality. 2. Forehead hematoma on the right. 3. Cerebral atrophy. 4. Paranasal sinus disease as described. CT cervical spine: 1. No evidence of an acute cervical spine fracture. 2. Grade 1 anterolisthesis at C2-C3, C3-C4 and C4-C5. 3. Levocurvature of the cervical spine. 4. Cervical spondylosis as described within the body of the report. Multifactorial severe spinal canal stenosis at C3-C4 (as was demonstrated on the recent prior cervical spine MRI of 10/09/2023). Multilevel bony neural foraminal narrowing. 5. Erosions noted along the left C3-C4 facet joint, a finding which can be seen in setting of CPPD arthropathy. 6. 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 Electronically Signed   By: Bascom Lily D.O.   On: 11/27/2023 13:27   CT Cervical Spine Wo Contrast Result Date: 11/27/2023 CLINICAL DATA:  Provided history: Head trauma, GCS = 15, no focal neuro findings. Neck trauma. Additional history provided: Fall on blood thinners. EXAM: CT HEAD WITHOUT CONTRAST CT CERVICAL SPINE WITHOUT CONTRAST TECHNIQUE: Multidetector CT imaging of the head and cervical spine was performed following the standard protocol without intravenous contrast. Multiplanar CT image reconstructions of the cervical spine were also generated. RADIATION DOSE REDUCTION: This exam was performed according to the  departmental dose-optimization program which includes automated exposure control, adjustment of the mA and/or kV according to patient size and/or use of iterative reconstruction technique. COMPARISON:  Prior head CT examinations 10/10/2023 and earlier. Cervical spine MRI 10/09/2023. FINDINGS: CT HEAD FINDINGS Brain: Generalized cerebral atrophy. There is no acute intracranial hemorrhage. No demarcated cortical infarct. No extra-axial fluid collection. No evidence of an intracranial mass. No midline shift. Vascular: No hyperdense vessel.  Atherosclerotic calcifications. Skull: No calvarial fracture or aggressive osseous lesion. Sinuses/Orbits: No mass or acute finding within the imaged orbits. Minimal mucosal thickening within the bilateral maxillary sinuses. Trace mucosal thickening within the right sphenoid sinus. Mild-to-moderate mucosal thickening within the left sphenoid sinus. Mild-to-moderate bilateral ethmoid sinusitis. Mild-to-moderate mucosal thickening within the left frontal sinus. Other: Forehead hematoma on the right. CT CERVICAL SPINE FINDINGS Alignment: Levocurvature of the cervical spine. 3 mm grade 1 anterolisthesis at C2-C3, C3-C4 and C4-C5. Skull base and vertebrae: The basion-dental and atlanto-dental intervals are maintained.No evidence of acute fracture to the cervical spine. 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 Soft tissues and spinal canal: No prevertebral fluid or swelling. No visible canal hematoma. Disc levels: Cervical spondylosis with multilevel disc space narrowing, disc bulges/central disc protrusions, posterior disc osteophyte complexes, uncovertebral hypertrophy and facet arthropathy. Erosions are noted along the left C3-C4 facet joint. Disc degeneration is greatest at C5-C6, C6-C7 and C7-T1 (advanced at these levels). Multilevel spinal canal narrowing. Most notably at C3-C4, a disc bulge contributes to multifactorial severe spinal canal stenosis (as was demonstrated on the recent  prior cervical spine MRI of 10/09/2023). Multilevel bony foraminal stenosis. Degenerative  changes also present at the C1-C2 articulation. Upper chest: No consolidation within the imaged lung apices. No visible pneumothorax. IMPRESSION: CT head: 1.  No evidence of an acute intracranial abnormality. 2. Forehead hematoma on the right. 3. Cerebral atrophy. 4. Paranasal sinus disease as described. CT cervical spine: 1. No evidence of an acute cervical spine fracture. 2. Grade 1 anterolisthesis at C2-C3, C3-C4 and C4-C5. 3. Levocurvature of the cervical spine. 4. Cervical spondylosis as described within the body of the report. Multifactorial severe spinal canal stenosis at C3-C4 (as was demonstrated on the recent prior cervical spine MRI of 10/09/2023). Multilevel bony neural foraminal narrowing. 5. Erosions noted along the left C3-C4 facet joint, a finding which can be seen in setting of CPPD arthropathy. 6. 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 Electronically Signed   By: Bascom Lily D.O.   On: 11/27/2023 13:27   DG Chest Port 1 View Result Date: 11/27/2023 CLINICAL DATA:  Sepsis EXAM: PORTABLE CHEST 1 VIEW COMPARISON:  10/20/2023. FINDINGS: Enlarged cardiopericardial silhouette. Tortuous ectatic aorta. No consolidation, pneumothorax or effusion. No edema. Prominent central vasculature. Film is under penetrated. Overlapping cardiac leads. Metallic focus along the left side of the heart. Status post TAVR. IMPRESSION: Underinflation with enlarged cardiopericardial silhouette. Prior TAVR. Electronically Signed   By: Adrianna Horde M.D.   On: 11/27/2023 11:30    Procedures Procedures    Medications Ordered in ED Medications  lactated ringers  infusion ( Intravenous New Bag/Given 11/27/23 1109)  vancomycin  (VANCOREADY) IVPB 2000 mg/400 mL (2,000 mg Intravenous New Bag/Given 11/27/23 1110)  lactated ringers  bolus 1,000 mL (0 mLs Intravenous Stopped 11/27/23 1122)  piperacillin -tazobactam (ZOSYN ) IVPB 3.375 g (0 g  Intravenous Stopped 11/27/23 1150)  acetaminophen  (TYLENOL ) tablet 1,000 mg (1,000 mg Oral Given 11/27/23 1122)  iohexol  (OMNIPAQUE ) 350 MG/ML injection 125 mL (125 mLs Intravenous Contrast Given 11/27/23 1230)  lactated ringers  bolus 1,000 mL (1,000 mLs Intravenous New Bag/Given 11/27/23 1404)    ED Course/ Medical Decision Making/ A&P Clinical Course as of 11/27/23 1456  Mon Nov 27, 2023  1146 CT Head Wo Contrast [WS]    Clinical Course User Index [WS] Mordecai Applebaum, MD                                 Medical Decision Making Amount and/or Complexity of Data Reviewed Labs: ordered. Radiology: ordered.  Risk OTC drugs. Prescription drug management.   This patient presents to the ED with chief complaint(s) of fall.  The complaint involves an extensive differential diagnosis and also carries with it a high risk of complications and morbidity.   pertinent past medical history as listed in HPI  The differential diagnosis includes  Sepsis, UTI, pneumonia, URI, acute abdomen, intracranial hemorrhage, fracture, dislocation, sprain  Additional history obtained: Additional history obtained from EMS  Records reviewed Care Everywhere/External Records  Initial Assessment:   Patient presents hypotensive 86/68, febrile and tachycardic following a fall out of the wheelchair onto his forehead.  Patient did sustain a superficial abrasion with surrounding ecchymosis on his right forehead.  Denies any cervical midline tenderness.  Will obtain CT head and cervical spine for further evaluation.   He has no neurodeficits on exam.  Does report feeling somewhat dizzy prior to the fall.  Not associated with chest pain or shortness of breath.  States that yesterday he was feeling generally unwell without dysuria, shortness of breath, vomiting or diarrhea.  No focal abdominal  tenderness.  Will begin undifferentiated sepsis workup.  Independent ECG interpretation:  Sinus rhythm, occasional PVC,  prolonged PR interval  Independent labs interpretation:  The following labs were independently interpreted:  UA without nitrates, leukocytes or hemoglobin, CBC without leukocytosis, lactic within normal limits, CMP without significant abnormality, respiratory panel positive for COVID-19  Independent visualization and interpretation of imaging: I independently visualized the following imaging with scope of interpretation limited to determining acute life threatening conditions related to emergency care: Chest x-ray underinflation with enlarged cardiac silhouette CT head/cervical spine forehead hematoma on the right otherwise no other acute abnormality CT chest abdomen pelvis pending  Treatment and Reassessment: Patient given liter bolus of normal saline following first assessment and started on empiric broad-spectrum antibiotics  Patient requiring another bolus of fluids, remains rather hypertensive with maps just above 60, noted to be positive for COVID-19, no longer afebrile.  CT abdomen pelvis are still pending, however given persistent hypertension we will pursue admission  Consultations obtained:   Hospitalist Dr. Felipe Horton agreed for admission  Disposition:   Patient will be admitted for further workup and evaluation  Social Determinants of Health:   none  This note was dictated with voice recognition software.  Despite best efforts at proofreading, errors may have occurred which can change the documentation meaning.          Final Clinical Impression(s) / ED Diagnoses Final diagnoses:  COVID-19    Rx / DC Orders ED Discharge Orders     None         Stanton Earthly 11/27/23 1542    Mordecai Applebaum, MD 11/28/23 8295    Mordecai Applebaum, MD 11/28/23 [phone]

## 2023-11-27 NOTE — Plan of Care (Signed)
  Problem: Education: Goal: Knowledge of risk factors and measures for prevention of condition will improve Outcome: Not Progressing   Problem: Coping: Goal: Psychosocial and spiritual needs will be supported Outcome: Not Progressing   Problem: Respiratory: Goal: Will maintain a patent airway Outcome: Not Progressing Goal: Complications related to the disease process, condition or treatment will be avoided or minimized Outcome: Not Progressing   Problem: Education: Goal: Ability to describe self-care measures that may prevent or decrease complications (Diabetes Survival Skills Education) will improve Outcome: Not Progressing Goal: Individualized Educational Video(s) Outcome: Not Progressing   Problem: Coping: Goal: Ability to adjust to condition or change in health will improve Outcome: Not Progressing   Problem: Fluid Volume: Goal: Ability to maintain a balanced intake and output will improve Outcome: Not Progressing   Problem: Health Behavior/Discharge Planning: Goal: Ability to identify and utilize available resources and services will improve Outcome: Not Progressing Goal: Ability to manage health-related needs will improve Outcome: Not Progressing   Problem: Metabolic: Goal: Ability to maintain appropriate glucose levels will improve Outcome: Not Progressing   Problem: Nutritional: Goal: Maintenance of adequate nutrition will improve Outcome: Not Progressing Goal: Progress toward achieving an optimal weight will improve Outcome: Not Progressing   Problem: Tissue Perfusion: Goal: Adequacy of tissue perfusion will improve Outcome: Not Progressing   Problem: Education: Goal: Knowledge of General Education information will improve Description: Including pain rating scale, medication(s)/side effects and non-pharmacologic comfort measures Outcome: Not Progressing

## 2023-11-27 NOTE — Progress Notes (Signed)
 Orthopedic Tech Progress Note Patient Details:  Lawrence [name] 11-03-1955 [id number]  LEVEL 2 TRAUMA   Patient ID: [name], male   DOB: 12-09-1955, 68 y.o.   MRN: [id number]  Kermitt Pedlar 11/27/2023, 10:22 AM

## 2023-11-28 ENCOUNTER — Observation Stay (HOSPITAL_COMMUNITY)

## 2023-11-28 ENCOUNTER — Encounter (HOSPITAL_COMMUNITY): Admitting: Cardiology

## 2023-11-28 ENCOUNTER — Inpatient Hospital Stay (HOSPITAL_COMMUNITY)

## 2023-11-28 ENCOUNTER — Ambulatory Visit (HOSPITAL_COMMUNITY)

## 2023-11-28 DIAGNOSIS — B952 Enterococcus as the cause of diseases classified elsewhere: Secondary | ICD-10-CM | POA: Diagnosis not present

## 2023-11-28 DIAGNOSIS — J449 Chronic obstructive pulmonary disease, unspecified: Secondary | ICD-10-CM | POA: Diagnosis present

## 2023-11-28 DIAGNOSIS — U071 COVID-19: Secondary | ICD-10-CM | POA: Diagnosis not present

## 2023-11-28 DIAGNOSIS — Z87891 Personal history of nicotine dependence: Secondary | ICD-10-CM | POA: Diagnosis not present

## 2023-11-28 DIAGNOSIS — E66813 Obesity, class 3: Secondary | ICD-10-CM | POA: Diagnosis present

## 2023-11-28 DIAGNOSIS — Z7401 Bed confinement status: Secondary | ICD-10-CM | POA: Diagnosis not present

## 2023-11-28 DIAGNOSIS — A4181 Sepsis due to Enterococcus: Secondary | ICD-10-CM | POA: Diagnosis present

## 2023-11-28 DIAGNOSIS — R569 Unspecified convulsions: Secondary | ICD-10-CM

## 2023-11-28 DIAGNOSIS — R6889 Other general symptoms and signs: Secondary | ICD-10-CM | POA: Diagnosis not present

## 2023-11-28 DIAGNOSIS — Z794 Long term (current) use of insulin: Secondary | ICD-10-CM | POA: Diagnosis not present

## 2023-11-28 DIAGNOSIS — I77810 Thoracic aortic ectasia: Secondary | ICD-10-CM | POA: Diagnosis present

## 2023-11-28 DIAGNOSIS — G40909 Epilepsy, unspecified, not intractable, without status epilepticus: Secondary | ICD-10-CM | POA: Diagnosis present

## 2023-11-28 DIAGNOSIS — Z881 Allergy status to other antibiotic agents status: Secondary | ICD-10-CM | POA: Diagnosis not present

## 2023-11-28 DIAGNOSIS — E1142 Type 2 diabetes mellitus with diabetic polyneuropathy: Secondary | ICD-10-CM | POA: Diagnosis present

## 2023-11-28 DIAGNOSIS — Z79899 Other long term (current) drug therapy: Secondary | ICD-10-CM | POA: Diagnosis not present

## 2023-11-28 DIAGNOSIS — E7800 Pure hypercholesterolemia, unspecified: Secondary | ICD-10-CM | POA: Diagnosis present

## 2023-11-28 DIAGNOSIS — Z953 Presence of xenogenic heart valve: Secondary | ICD-10-CM | POA: Diagnosis not present

## 2023-11-28 DIAGNOSIS — F32A Depression, unspecified: Secondary | ICD-10-CM | POA: Diagnosis present

## 2023-11-28 DIAGNOSIS — Z96652 Presence of left artificial knee joint: Secondary | ICD-10-CM | POA: Diagnosis present

## 2023-11-28 DIAGNOSIS — R7881 Bacteremia: Secondary | ICD-10-CM | POA: Diagnosis not present

## 2023-11-28 DIAGNOSIS — R509 Fever, unspecified: Secondary | ICD-10-CM | POA: Diagnosis not present

## 2023-11-28 DIAGNOSIS — I3139 Other pericardial effusion (noninflammatory): Secondary | ICD-10-CM | POA: Diagnosis not present

## 2023-11-28 DIAGNOSIS — I509 Heart failure, unspecified: Secondary | ICD-10-CM | POA: Diagnosis not present

## 2023-11-28 DIAGNOSIS — Z993 Dependence on wheelchair: Secondary | ICD-10-CM | POA: Diagnosis not present

## 2023-11-28 DIAGNOSIS — I480 Paroxysmal atrial fibrillation: Secondary | ICD-10-CM | POA: Diagnosis present

## 2023-11-28 DIAGNOSIS — Z6841 Body Mass Index (BMI) 40.0 and over, adult: Secondary | ICD-10-CM | POA: Diagnosis not present

## 2023-11-28 DIAGNOSIS — Z1621 Resistance to vancomycin: Secondary | ICD-10-CM | POA: Diagnosis present

## 2023-11-28 DIAGNOSIS — Z743 Need for continuous supervision: Secondary | ICD-10-CM | POA: Diagnosis not present

## 2023-11-28 DIAGNOSIS — I38 Endocarditis, valve unspecified: Secondary | ICD-10-CM | POA: Diagnosis not present

## 2023-11-28 DIAGNOSIS — Z66 Do not resuscitate: Secondary | ICD-10-CM | POA: Diagnosis present

## 2023-11-28 DIAGNOSIS — E11649 Type 2 diabetes mellitus with hypoglycemia without coma: Secondary | ICD-10-CM | POA: Diagnosis not present

## 2023-11-28 DIAGNOSIS — J441 Chronic obstructive pulmonary disease with (acute) exacerbation: Secondary | ICD-10-CM

## 2023-11-28 DIAGNOSIS — I2510 Atherosclerotic heart disease of native coronary artery without angina pectoris: Secondary | ICD-10-CM | POA: Diagnosis not present

## 2023-11-28 DIAGNOSIS — I110 Hypertensive heart disease with heart failure: Secondary | ICD-10-CM | POA: Diagnosis present

## 2023-11-28 DIAGNOSIS — W050XXA Fall from non-moving wheelchair, initial encounter: Secondary | ICD-10-CM | POA: Diagnosis present

## 2023-11-28 DIAGNOSIS — I5032 Chronic diastolic (congestive) heart failure: Secondary | ICD-10-CM | POA: Diagnosis present

## 2023-11-28 DIAGNOSIS — N400 Enlarged prostate without lower urinary tract symptoms: Secondary | ICD-10-CM | POA: Diagnosis present

## 2023-11-28 LAB — CBC
HCT: 37.2 % — ABNORMAL LOW (ref 39.0–52.0)
Hemoglobin: 12.3 g/dL — ABNORMAL LOW (ref 13.0–17.0)
MCH: 30.1 pg (ref 26.0–34.0)
MCHC: 33.1 g/dL (ref 30.0–36.0)
MCV: 91 fL (ref 80.0–100.0)
Platelets: 153 10*3/uL (ref 150–400)
RBC: 4.09 MIL/uL — ABNORMAL LOW (ref 4.22–5.81)
RDW: 14 % (ref 11.5–15.5)
WBC: 6.7 10*3/uL (ref 4.0–10.5)
nRBC: 0 % (ref 0.0–0.2)

## 2023-11-28 LAB — PROCALCITONIN: Procalcitonin: 0.35 ng/mL

## 2023-11-28 LAB — BLOOD CULTURE ID PANEL (REFLEXED) - BCID2
A.calcoaceticus-baumannii: NOT DETECTED
Bacteroides fragilis: NOT DETECTED
Candida albicans: NOT DETECTED
Candida auris: NOT DETECTED
Candida glabrata: NOT DETECTED
Candida krusei: NOT DETECTED
Candida parapsilosis: NOT DETECTED
Candida tropicalis: NOT DETECTED
Cryptococcus neoformans/gattii: NOT DETECTED
Enterobacter cloacae complex: NOT DETECTED
Enterobacterales: NOT DETECTED
Enterococcus Faecium: DETECTED — AB
Enterococcus faecalis: NOT DETECTED
Escherichia coli: NOT DETECTED
Haemophilus influenzae: NOT DETECTED
Klebsiella aerogenes: NOT DETECTED
Klebsiella oxytoca: NOT DETECTED
Klebsiella pneumoniae: NOT DETECTED
Listeria monocytogenes: NOT DETECTED
Neisseria meningitidis: NOT DETECTED
Proteus species: NOT DETECTED
Pseudomonas aeruginosa: NOT DETECTED
Salmonella species: NOT DETECTED
Serratia marcescens: NOT DETECTED
Staphylococcus aureus (BCID): NOT DETECTED
Staphylococcus epidermidis: NOT DETECTED
Staphylococcus lugdunensis: NOT DETECTED
Staphylococcus species: NOT DETECTED
Stenotrophomonas maltophilia: NOT DETECTED
Streptococcus agalactiae: NOT DETECTED
Streptococcus pneumoniae: NOT DETECTED
Streptococcus pyogenes: NOT DETECTED
Streptococcus species: NOT DETECTED
Vancomycin resistance: DETECTED — AB

## 2023-11-28 LAB — MRSA NEXT GEN BY PCR, NASAL: MRSA by PCR Next Gen: NOT DETECTED

## 2023-11-28 LAB — BASIC METABOLIC PANEL WITH GFR
Anion gap: 7 (ref 5–15)
BUN: 13 mg/dL (ref 8–23)
CO2: 26 mmol/L (ref 22–32)
Calcium: 8.4 mg/dL — ABNORMAL LOW (ref 8.9–10.3)
Chloride: 104 mmol/L (ref 98–111)
Creatinine, Ser: 1.05 mg/dL (ref 0.61–1.24)
GFR, Estimated: >60 mL/min (ref >60)
Glucose, Bld: 97 mg/dL (ref 70–99)
Potassium: 3.2 mmol/L — ABNORMAL LOW (ref 3.5–5.1)
Sodium: 137 mmol/L (ref 135–145)

## 2023-11-28 LAB — C-REACTIVE PROTEIN: CRP: 4.1 mg/dL — ABNORMAL HIGH (ref <1.0)

## 2023-11-28 LAB — URIC ACID: Uric Acid, Serum: 5.5 mg/dL (ref 3.7–8.6)

## 2023-11-28 LAB — GLUCOSE, CAPILLARY
Glucose-Capillary: 160 mg/dL — ABNORMAL HIGH (ref 70–99)
Glucose-Capillary: 179 mg/dL — ABNORMAL HIGH (ref 70–99)
Glucose-Capillary: 231 mg/dL — ABNORMAL HIGH (ref 70–99)

## 2023-11-28 LAB — CK: Total CK: 67 U/L (ref 49–397)

## 2023-11-28 LAB — BRAIN NATRIURETIC PEPTIDE: B Natriuretic Peptide: 94.5 pg/mL (ref 0.0–100.0)

## 2023-11-28 MED ORDER — POTASSIUM CHLORIDE CRYS ER 20 MEQ PO TBCR
40.0000 meq | EXTENDED_RELEASE_TABLET | Freq: Once | ORAL | Status: AC
  Administered 2023-11-28: 40 meq via ORAL
  Filled 2023-11-28: qty 2

## 2023-11-28 MED ORDER — COLCHICINE 0.6 MG PO TABS
0.6000 mg | ORAL_TABLET | Freq: Two times a day (BID) | ORAL | Status: DC
  Administered 2023-11-28 – 2023-12-08 (×21): 0.6 mg via ORAL
  Filled 2023-11-28 (×22): qty 1

## 2023-11-28 MED ORDER — VITAMIN B-12 1000 MCG PO TABS
1000.0000 ug | ORAL_TABLET | Freq: Every day | ORAL | Status: DC
Start: 2023-11-28 — End: 2023-12-09
  Administered 2023-11-28 – 2023-12-08 (×11): 1000 ug via ORAL
  Filled 2023-11-28 (×11): qty 1

## 2023-11-28 MED ORDER — INSULIN GLARGINE-YFGN 100 UNIT/ML ~~LOC~~ SOLN
8.0000 [IU] | Freq: Once | SUBCUTANEOUS | Status: AC
  Administered 2023-11-28: 8 [IU] via SUBCUTANEOUS
  Filled 2023-11-28: qty 0.08

## 2023-11-28 MED ORDER — SODIUM CHLORIDE 0.9 % IV SOLN
1200.0000 mg | Freq: Every day | INTRAVENOUS | Status: DC
  Administered 2023-11-28: 1200 mg via INTRAVENOUS
  Filled 2023-11-28: qty 24

## 2023-11-28 MED ORDER — MIDODRINE HCL 5 MG PO TABS
10.0000 mg | ORAL_TABLET | Freq: Three times a day (TID) | ORAL | Status: DC
  Administered 2023-11-28 – 2023-12-01 (×11): 10 mg via ORAL
  Filled 2023-11-28 (×13): qty 2

## 2023-11-28 MED ORDER — SODIUM CHLORIDE 0.9 % IV SOLN
10.0000 mg/kg | Freq: Every day | INTRAVENOUS | Status: DC
  Administered 2023-11-29 – 2023-12-07 (×9): 1100 mg via INTRAVENOUS
  Filled 2023-11-28 (×10): qty 22

## 2023-11-28 MED ORDER — SODIUM CHLORIDE 0.9 % IV SOLN
2.0000 g | INTRAVENOUS | Status: DC
  Administered 2023-11-28 – 2023-12-07 (×48): 2 g via INTRAVENOUS
  Filled 2023-11-28 (×60): qty 2000

## 2023-11-28 MED ORDER — LACTATED RINGERS IV SOLN: INTRAVENOUS | Status: AC

## 2023-11-28 MED ORDER — METHYLPREDNISOLONE SODIUM SUCC 40 MG IJ SOLR
40.0000 mg | Freq: Once | INTRAMUSCULAR | Status: AC
  Administered 2023-11-28: 40 mg via INTRAVENOUS
  Filled 2023-11-28: qty 1

## 2023-11-28 MED ORDER — BISACODYL 5 MG PO TBEC: 10.0000 mg | DELAYED_RELEASE_TABLET | Freq: Every day | ORAL | Status: DC | PRN

## 2023-11-28 NOTE — Progress Notes (Addendum)
 PHARMACY - PHYSICIAN COMMUNICATION CRITICAL VALUE ALERT - BLOOD CULTURE IDENTIFICATION (BCID)  Lawrence [name] is an 68 y.o. male who presented to [facility] on 11/27/2023 from [facility]  with a chief complaint of hypotension/fever/sepsis  Assessment:   Blood culture growing vancomycin  resistant Enterococcus faecalis   Name of physician (or Provider) Contacted:  Dr. Farrel Hones  Current antibiotics:  None  Changes to prescribed antibiotics recommended:  Start Daptomycin  1200 mg IV q24h  Results for orders placed or performed during the hospital encounter of 11/27/23  Blood Culture ID Panel (Reflexed) (Collected: 11/27/2023  9:33 AM)  Result Value Ref Range   Enterococcus faecalis NOT DETECTED NOT DETECTED   Enterococcus Faecium DETECTED (A) NOT DETECTED   Listeria monocytogenes NOT DETECTED NOT DETECTED   Staphylococcus species NOT DETECTED NOT DETECTED   Staphylococcus aureus (BCID) NOT DETECTED NOT DETECTED   Staphylococcus epidermidis NOT DETECTED NOT DETECTED   Staphylococcus lugdunensis NOT DETECTED NOT DETECTED   Streptococcus species NOT DETECTED NOT DETECTED   Streptococcus agalactiae NOT DETECTED NOT DETECTED   Streptococcus pneumoniae NOT DETECTED NOT DETECTED   Streptococcus pyogenes NOT DETECTED NOT DETECTED   A.calcoaceticus-baumannii NOT DETECTED NOT DETECTED   Bacteroides fragilis NOT DETECTED NOT DETECTED   Enterobacterales NOT DETECTED NOT DETECTED   Enterobacter cloacae complex NOT DETECTED NOT DETECTED   Escherichia coli NOT DETECTED NOT DETECTED   Klebsiella aerogenes NOT DETECTED NOT DETECTED   Klebsiella oxytoca NOT DETECTED NOT DETECTED   Klebsiella pneumoniae NOT DETECTED NOT DETECTED   Proteus species NOT DETECTED NOT DETECTED   Salmonella species NOT DETECTED NOT DETECTED   Serratia marcescens NOT DETECTED NOT DETECTED   Haemophilus influenzae NOT DETECTED NOT DETECTED   Neisseria meningitidis NOT DETECTED NOT DETECTED   Pseudomonas aeruginosa NOT  DETECTED NOT DETECTED   Stenotrophomonas maltophilia NOT DETECTED NOT DETECTED   Candida albicans NOT DETECTED NOT DETECTED   Candida auris NOT DETECTED NOT DETECTED   Candida glabrata NOT DETECTED NOT DETECTED   Candida krusei NOT DETECTED NOT DETECTED   Candida parapsilosis NOT DETECTED NOT DETECTED   Candida tropicalis NOT DETECTED NOT DETECTED   Cryptococcus neoformans/gattii NOT DETECTED NOT DETECTED   Vancomycin  resistance DETECTED (A) NOT DETECTED    Carlota Chestnut 11/28/2023  5:35 AM

## 2023-11-28 NOTE — Consult Note (Signed)
 [facility]    Date of Admission:  11/27/2023     Total days of antibiotics 0   Daptomycin    Ampicillin                Reason for Consult: VRE bacteremia     Referring Provider: autoconsultation  Primary Care Provider: Darnelle Elders, PA-C   Assessment: Lawrence [name] is a 68 y.o. male admitted with -    VRE Bacteremia (E Faecium) -  High Grade blood stream infection (growing in all 4 bottles collected 4/12) without any good explanation for bacteremia. TAVR in place raises high degree of concern for bacterial PVE. He has no localizing complaints to investigate otherwise. U/A bland and without any dysuria.  Has had back to back viral infections (Flu A and now COVID).  - repeat blood cultures tomorrow 4/23 AM - Recommend combination therapy with daptomycin  + ampicillin   - Send for daptomycin  MIC  - FU for susceptibilities. Hopeful it is ampicillin  friendly  - TEE discussed with cardiology - recommended to start with surface echo and then can consider TEE if negative after 10d of isolation for acute covid infection   S/P TAVR July 2024 -  Concern of course that he has prosthetic valve endocarditis with unexplained high grade bacteremia.  - TTE ordered   COVID + (CT value 26) - Recently with Influenza A (March 2025)  AECOPD -  Requiring 2 LPM nasal cannula and breathing comfortably on exam / interview today. Denies any upper respiratory symptoms. Continue airborne/contact isolation x 10 days  No indications for treatment at this time.  - airborne / contact isolation day 2   Plan: FU micro pending  Send blood cultures for daptomycin  MIC Recommend dapto + ampicillin  for treatment  TTE ordered, will need TEE if negative or inadequate study  Repeat blood cultures in AM Follow for any other localizing signs of infection     Principal Problem:   COVID-19 virus infection Active Problems:   OSA (obstructive sleep apnea)   Type 2  diabetes mellitus with obesity (HCC)   Chronic diastolic CHF (congestive heart failure) (HCC)   Seizure disorder (HCC)   AF (paroxysmal atrial fibrillation) (HCC)   COPD (chronic obstructive pulmonary disease) (HCC)   S/P TAVR (transcatheter aortic valve replacement)   Hypotension   Obesity, Class III, BMI 40-49.9 (morbid obesity) (HCC)   SIRS (systemic inflammatory response syndrome) (HCC)   Fall from wheelchair   Hyperlipidemia    apixaban   5 mg Oral BID   ARIPiprazole   5 mg Oral QHS   atorvastatin   40 mg Oral QHS   busPIRone   10 mg Oral TID   colchicine   0.6 mg Oral BID   cyanocobalamin   1,000 mcg Oral Daily   fenofibrate   160 mg Oral Daily   fluticasone   2 spray Each Nare Daily   guaiFENesin   600 mg Oral BID   insulin  aspart  0-5 Units Subcutaneous QHS   insulin  aspart  0-9 Units Subcutaneous TID WC   lacosamide   150 mg Oral BID   levETIRAcetam   500 mg Oral BID   midodrine   10 mg Oral TID WC   mirtazapine   15 mg Oral QHS   prazosin   2 mg Oral QHS   pregabalin   150 mg Oral TID   repaglinide   2 mg Oral BID   rOPINIRole   0.25 mg Oral BID   sodium chloride  flush  3 mL Intravenous Q12H  traZODone   200 mg Oral QHS   umeclidinium bromide   1 puff Inhalation Daily   venlafaxine  XR  150 mg Oral Q breakfast    HPI: Lawrence [name] is a 68 y.o. male admitted from SNF with fall and fever   Was at rehab and fell out of wheelchair. Does not remember much about when he came here.  Described some right sided foot pain after fall but does not recall any fevers or chills or respiratory symptoms. He reports foot pain that is normal for him. He says he fell out of the wheelchair while he was reaching for something. Did not have any weakness or dizziness. He was hypotensive when he arrived that resonded well to IVF and no pressors.   Had TAVR in July 2024. Did well and procedure went well. He has not noted any   Hospitalization 3/14 - 3/19 for influenza A infection with superimposed PNA  and COPD exacerbation.    Review of Systems: Review of Systems  Constitutional:  Positive for fever. Negative for chills, diaphoresis and malaise/fatigue.  Cardiovascular:  Negative for chest pain and leg swelling.  Gastrointestinal:  Negative for abdominal pain, diarrhea, nausea and vomiting.  Genitourinary:  Negative for dysuria.  Musculoskeletal:  Negative for back pain and joint pain.  Skin:  Negative for rash.  All other systems reviewed and are negative.   Past Medical History:  Diagnosis Date   Acquired dilation of ascending aorta and aortic root (HCC)    40mm by echo 01/2021   Adenomatous colon polyp 2007   Anemia    Anxiety    Asthma    BPH without obstruction/lower urinary tract symptoms 02/22/2017   Chronic diastolic (congestive) heart failure (HCC)    Chronic venous stasis 03/07/2019   COPD (chronic obstructive pulmonary disease) (HCC)    Coronary artery calcification seen on CAT scan    Depression    Diabetic neuropathy (HCC) 09/11/2019   History of colon polyps 08/24/2018   Hypertension    Morbid obesity (HCC)    OSA (obstructive sleep apnea)    Pain due to onychomycosis of toenails of both feet 09/11/2019   Peripheral neuropathy 02/22/2017   Primary osteoarthritis, left shoulder 03/05/2017   PTSD (post-traumatic stress disorder)    Pure hypercholesterolemia    QT prolongation 03/07/2019   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 Lorie Rook and Dr. Sherene Dilling   Seizures [facility])    Severe aortic stenosis    Sinus tachycardia 03/07/2019   Sleep apnea    CPAP   Type 2 diabetes mellitus with vascular disease (HCC) 09/11/2019    Social History   Tobacco Use   Smoking status: Former    Current packs/day: 0.00    Average packs/day: 1 pack/day for 44.0 years (44.0 ttl pk-yrs)    Types: Cigarettes    Start date: 03/07/1972    Quit date: 03/07/2016    Years since quitting: 7.7    Passive exposure: Past    Smokeless tobacco: Never  Vaping Use   Vaping status: Never Used  Substance Use Topics   Alcohol  use: Yes    Comment: rare   Drug use: No    Family History  Problem Relation Age of Onset   CAD Maternal Grandfather    Diabetes Other    Diabetes Mellitus II Neg Hx    Colon cancer Neg Hx    Esophageal cancer Neg Hx    Inflammatory bowel disease  Neg Hx    Liver disease Neg Hx    Pancreatic cancer Neg Hx    Rectal cancer Neg Hx    Stomach cancer Neg Hx    Sleep apnea Neg Hx    Allergies  Allergen Reactions   Niacin And Related Other (See Comments)    Redness, flushing   Ppd [Tuberculin Purified Protein Derivative]     Unknown rxn (MAR)   Tubersol [Tuberculin, Ppd] Other (See Comments)    Unknown rxn (MAR)   Firvanq  [Vancomycin ] Other (See Comments)    Red man syndrome   Vibramycin  [Doxycycline ] Rash    OBJECTIVE: Blood pressure (!) 114/58, pulse (!) 104, temperature 99.3 F (37.4 C), temperature source Oral, resp. rate 14, height 6\' 2"  (1.88 m), weight (!) 159.7 kg, SpO2 94%.  Physical Exam Constitutional:      Appearance: Normal appearance. He is obese. He is not ill-appearing.  Cardiovascular:     Rate and Rhythm: Normal rate.     Heart sounds: No murmur heard. Pulmonary:     Effort: Pulmonary effort is normal.     Breath sounds: Normal breath sounds.     Comments: On 2 lpm [place]  Musculoskeletal:     Cervical back: Normal range of motion.  Skin:    General: Skin is warm.     Capillary Refill: Capillary refill takes less than 2 seconds.     Findings: Rash (b/l stasis dermatitis on LEs) present.  Neurological:     Mental Status: He is alert and oriented to person, place, and time.     Lab Results Lab Results  Component Value Date   WBC 6.7 11/28/2023   HGB 12.3 (L) 11/28/2023   HCT 37.2 (L) 11/28/2023   MCV 91.0 11/28/2023   PLT 153 11/28/2023    Lab Results  Component Value Date   CREATININE 1.05 11/28/2023   BUN 13 11/28/2023   NA 137 11/28/2023    K 3.2 (L) 11/28/2023   CL 104 11/28/2023   CO2 26 11/28/2023    Lab Results  Component Value Date   ALT 25 11/27/2023   AST 25 11/27/2023   ALKPHOS 43 11/27/2023   BILITOT 0.9 11/27/2023     Microbiology: Recent Results (from the past 240 hours)  Resp panel by RT-PCR (RSV, Flu A&B, Covid) Anterior Nasal Swab     Status: Abnormal   Collection Time: 11/27/23  9:33 AM   Specimen: Anterior Nasal Swab  Result Value Ref Range Status   SARS Coronavirus 2 by RT PCR POSITIVE (A) NEGATIVE Final   Influenza A by PCR NEGATIVE NEGATIVE Final   Influenza B by PCR NEGATIVE NEGATIVE Final    Comment: (NOTE) The Xpert Xpress SARS-CoV-2/FLU/RSV plus assay is intended as an aid in the diagnosis of influenza from Nasopharyngeal swab specimens and should not be used as a sole basis for treatment. Nasal washings and aspirates are unacceptable for Xpert Xpress SARS-CoV-2/FLU/RSV testing.  Fact Sheet for Patients: BloggerCourse.com  Fact Sheet for Healthcare Providers: SeriousBroker.it  This test is not yet approved or cleared by the [place]  FDA and has been authorized for detection and/or diagnosis of SARS-CoV-2 by FDA under an Emergency Use Authorization (EUA). This EUA will remain in effect (meaning this test can be used) for the duration of the COVID-19 declaration under Section 564(b)(1) of the Act, 21 U.S.C. section 360bbb-3(b)(1), unless the authorization is terminated or revoked.     Resp Syncytial Virus by PCR NEGATIVE NEGATIVE Final    Comment: (  NOTE) Fact Sheet for Patients: BloggerCourse.com  Fact Sheet for Healthcare Providers: SeriousBroker.it  This test is not yet approved or cleared by the [place]  FDA and has been authorized for detection and/or diagnosis of SARS-CoV-2 by FDA under an Emergency Use Authorization (EUA). This EUA will remain in effect (meaning  this test can be used) for the duration of the COVID-19 declaration under Section 564(b)(1) of the Act, 21 U.S.C. section 360bbb-3(b)(1), unless the authorization is terminated or revoked.  Performed at [facility], [address], [place], [place] [address]   Blood Culture (routine x 2)     Status: None (Preliminary result)   Collection Time: 11/27/23  9:33 AM   Specimen: BLOOD  Result Value Ref Range Status   Specimen Description BLOOD LEFT ANTECUBITAL  Final   Special Requests   Final    BOTTLES DRAWN AEROBIC AND ANAEROBIC Blood Culture results may not be optimal due to an inadequate volume of blood received in culture bottles   Culture  Setup Time   Final    GRAM POSITIVE COCCI IN BOTH AEROBIC AND ANAEROBIC BOTTLES CRITICAL RESULT CALLED TO, READ BACK BY AND VERIFIED WITH: Alycia Babcock A [id number] FCP Performed at [facility], [address] [place], [place] [address]    Culture GRAM POSITIVE COCCI  Final   Report Status PENDING  Incomplete  Blood Culture ID Panel (Reflexed)     Status: Abnormal   Collection Time: 11/27/23  9:33 AM  Result Value Ref Range Status   Enterococcus faecalis NOT DETECTED NOT DETECTED Final   Enterococcus Faecium DETECTED (A) NOT DETECTED Final    Comment: CRITICAL RESULT CALLED TO, READ BACK BY AND VERIFIED WITH: PHARMD GREG A [id number] FCP      Listeria monocytogenes NOT DETECTED NOT DETECTED Final   Staphylococcus species NOT DETECTED NOT DETECTED Final   Staphylococcus aureus (BCID) NOT DETECTED NOT DETECTED Final   Staphylococcus epidermidis NOT DETECTED NOT DETECTED Final   Staphylococcus lugdunensis NOT DETECTED NOT DETECTED Final   Streptococcus species NOT DETECTED NOT DETECTED Final   Streptococcus agalactiae NOT DETECTED NOT DETECTED Final   Streptococcus pneumoniae NOT DETECTED NOT DETECTED Final   Streptococcus pyogenes NOT DETECTED NOT DETECTED Final   A.calcoaceticus-baumannii NOT DETECTED NOT DETECTED Final    Bacteroides fragilis NOT DETECTED NOT DETECTED Final   Enterobacterales NOT DETECTED NOT DETECTED Final   Enterobacter cloacae complex NOT DETECTED NOT DETECTED Final   Escherichia coli NOT DETECTED NOT DETECTED Final   Klebsiella aerogenes NOT DETECTED NOT DETECTED Final   Klebsiella oxytoca NOT DETECTED NOT DETECTED Final   Klebsiella pneumoniae NOT DETECTED NOT DETECTED Final   Proteus species NOT DETECTED NOT DETECTED Final   Salmonella species NOT DETECTED NOT DETECTED Final   Serratia marcescens NOT DETECTED NOT DETECTED Final   Haemophilus influenzae NOT DETECTED NOT DETECTED Final   Neisseria meningitidis NOT DETECTED NOT DETECTED Final   Pseudomonas aeruginosa NOT DETECTED NOT DETECTED Final   Stenotrophomonas maltophilia NOT DETECTED NOT DETECTED Final   Candida albicans NOT DETECTED NOT DETECTED Final   Candida auris NOT DETECTED NOT DETECTED Final   Candida glabrata NOT DETECTED NOT DETECTED Final   Candida krusei NOT DETECTED NOT DETECTED Final   Candida parapsilosis NOT DETECTED NOT DETECTED Final   Candida tropicalis NOT DETECTED NOT DETECTED Final   Cryptococcus neoformans/gattii NOT DETECTED NOT DETECTED Final   Vancomycin  resistance DETECTED (A) NOT DETECTED Final    Comment: CRITICAL RESULT CALLED  TO, READ BACK BY AND VERIFIED WITH: Alycia Babcock A [id number] FCP   Performed at [facility], [address], [place], [place] [address]   Blood Culture (routine x 2)     Status: None (Preliminary result)   Collection Time: 11/27/23 10:23 AM   Specimen: BLOOD  Result Value Ref Range Status   Specimen Description BLOOD RIGHT ANTECUBITAL  Final   Special Requests   Final    BOTTLES DRAWN AEROBIC AND ANAEROBIC Blood Culture results may not be optimal due to an inadequate volume of blood received in culture bottles   Culture  Setup Time   Final    GRAM POSITIVE COCCI IN BOTH AEROBIC AND ANAEROBIC BOTTLES CRITICAL VALUE NOTED.  VALUE IS CONSISTENT WITH PREVIOUSLY  REPORTED AND CALLED VALUE. Performed at [facility], [address] [place], [place] [address]    Culture GRAM POSITIVE COCCI  Final   Report Status PENDING  Incomplete  MRSA Next Gen by PCR, Nasal     Status: None   Collection Time: 11/28/23  5:52 AM   Specimen: Nasal Mucosa; Nasal Swab  Result Value Ref Range Status   MRSA by PCR Next Gen NOT DETECTED NOT DETECTED Final    Comment: (NOTE) The GeneXpert MRSA Assay (FDA approved for NASAL specimens only), is one component of a comprehensive MRSA colonization surveillance program. It is not intended to diagnose MRSA infection nor to guide or monitor treatment for MRSA infections. Test performance is not FDA approved in patients less than 47 years old. Performed at [facility], [address], [place], [place] [address]      Gibson Kurtz, MSN, NP-C [facility]  [email or web address] Pager: [phone] Office: [phone] [facility] Main Line: [phone] *Secure Chat Communication Welcome

## 2023-11-28 NOTE — Procedures (Signed)
 Patient Name: HASANI DIEMER  MRN: [id number]  Epilepsy Attending: Arleene Lack  Referring Physician/Provider: Cala Castleman, MD  Date: 11/28/2023 Duration: 22.33 mins  Patient history: 68yo M has been having twitching in his right lower extremity for a few days. EEG to evaluate for seizure  Level of alertness: Awake, asleep  AEDs during EEG study: LEV, LCM  Technical aspects: This EEG study was done with scalp electrodes positioned according to the 10-20 International system of electrode placement. Electrical activity was reviewed with band pass filter of 1-70Hz , sensitivity of 7 uV/mm, display speed of 76mm/sec with a 60Hz  notched filter applied as appropriate. EEG data were recorded continuously and digitally stored.  Video monitoring was available and reviewed as appropriate.  Description: The posterior dominant rhythm consists of 7.5 Hz activity of moderate voltage (25-35 uV) seen predominantly in posterior head regions, symmetric and reactive to eye opening and eye closing. Sleep was characterized by vertex waves, sleep spindles (12 to 14 Hz), maximal frontocentral region. Physiologic photic driving was not seen during photic stimulation.  Hyperventilation was not performed.     IMPRESSION: This study is within normal limits. No seizures or definite epileptiform discharges were seen throughout the recording.   A normal interictal EEG does not exclude the diagnosis of epilepsy.  Kaylan Friedmann O Jaedin Trumbo

## 2023-11-28 NOTE — TOC Initial Note (Signed)
 Transition of Care [facility]) - Initial/Assessment Note    Patient Details  Name: Lawrence [name] MRN: [id number] Date of Birth: April 14, 1956  Transition of Care [facility]) CM/SW Contact:    Jannice Mends, LCSW Phone Number: 11/28/2023, 2:50 PM  Clinical Narrative:                 Patient admitted from [facility]. Per facility, they request CSW attempt insurance authorization. Otherwise patient will return Medicaid pending.   Expected Discharge Plan: Skilled Nursing Facility Barriers to Discharge: Continued Medical Work up   Patient Goals and CMS Choice Patient states their goals for this hospitalization and ongoing recovery are:: Return to SNF          Expected Discharge Plan and Services In-house Referral: Clinical Social Work   Post Acute Care Choice: Skilled Nursing Facility Living arrangements for the past 2 months: Skilled Nursing Facility                                      Prior Living Arrangements/Services Living arrangements for the past 2 months: Skilled Nursing Facility Lives with:: Facility Resident Patient language and need for interpreter reviewed:: Yes Do you feel safe going back to the place where you live?: Yes      Need for Family Participation in Patient Care: Yes (Comment) Care giver support system in place?: Yes (comment) Current home services: DME Criminal Activity/Legal Involvement Pertinent to Current Situation/Hospitalization: No - Comment as needed  Activities of Daily Living      Permission Sought/Granted Permission sought to share information with : Facility Industrial/product designer granted to share information with : Yes, Verbal Permission Granted     Permission granted to share info w AGENCY: [name]        Emotional Assessment       Orientation: : Oriented to Self, Oriented to Place, Oriented to  Time, Oriented to Situation Alcohol  / Substance Use: Not Applicable Psych Involvement: No (comment)  Admission  diagnosis:  COVID-19 virus infection [U07.1] COVID-19 [U07.1] Patient Active Problem List   Diagnosis Date Noted   COVID-19 virus infection 11/27/2023   Fever 11/27/2023   Fall from wheelchair 11/27/2023   Hyperlipidemia 11/27/2023   Acute respiratory failure with hypoxia (HCC) 10/20/2023   Influenza A with pneumonia 10/20/2023   Altered mental status 10/05/2023   MDD (major depressive disorder), recurrent severe, without psychosis (HCC) 10/05/2023   Hypoglycemia 10/03/2023   SIRS (systemic inflammatory response syndrome) (HCC) 10/03/2023   History of COPD 10/03/2023   History of seizures 10/03/2023   Multiple falls 07/21/2023   Fall 07/15/2023   Acute metabolic encephalopathy 07/14/2023   Obesity, Class III, BMI 40-49.9 (morbid obesity) (HCC) 07/14/2023   History of CVA with residual deficit 07/14/2023   Fall at home, initial encounter 07/14/2023   Gout 07/12/2023   Constipation 07/09/2023   Polyneuropathy 07/04/2023   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   Acute kidney injury superimposed on stage 3a chronic kidney disease (HCC) 07/03/2023   Hypotension 07/03/2023   Stage 3a chronic kidney disease (HCC) 06/30/2023   Acute stroke due to occlusion of left cerebellar artery (HCC) 06/28/2023   Type 2 diabetes mellitus with diabetic polyneuropathy, with long-term current use of insulin  (HCC) 06/28/2023   CVA (cerebral vascular accident) (HCC) 06/25/2023   Atrial fibrillation, chronic (HCC) 06/25/2023   Acute CVA (cerebrovascular accident) (HCC) 06/25/2023   S/P TAVR (transcatheter aortic  valve replacement) 02/07/2023   Acute respiratory failure with hypoxia and hypercapnia (HCC) 01/23/2023   Nail, injury by, initial encounter 12/09/2022   Heart failure (HCC) 10/27/2022   Gout attack 10/27/2022   CHF (congestive heart failure) (HCC) 10/26/2022   Right sided weakness 05/26/2022   Dysarthria 05/25/2022   Ischemic stroke (HCC) 02/25/2022   Hemiparesis affecting  left side as late effect of cerebrovascular accident (CVA) (HCC) 02/25/2022   Durable power of attorney for healthcare exists  02/25/2022   Depression 08/07/2021   Paronychia of great toe, left 07/07/2021   Stroke-like symptoms 04/27/2021   Ambulatory dysfunction 04/27/2021   Left-sided weakness 02/09/2021   Atrial fibrillation with rapid ventricular response (HCC) 02/08/2021   AF (paroxysmal atrial fibrillation) (HCC) 01/25/2021   COPD (chronic obstructive pulmonary disease) (HCC) 01/25/2021   Aortic stenosis 01/21/2021   Acquired dilation of ascending aorta and aortic root (HCC) 01/21/2021   Coronary artery disease due to lipid rich plaque    SVT (supraventricular tachycardia) (HCC)    Seizure disorder (HCC)    Primary hypertension 10/17/2020   Hypokalemia 10/17/2020   Cellulitis of right leg 12/04/2019   Pain due to onychomycosis of toenails of both feet 09/11/2019   Diabetic neuropathy (HCC) 09/11/2019   DM2 (diabetes mellitus, type 2) (HCC) 09/11/2019   Sinus tachycardia 03/07/2019   Chronic venous stasis 03/07/2019   History of anemia 12/26/2018   Anemia 08/24/2018   History of colon polyps 08/24/2018   Do not resuscitate status 08/24/2018   Chronic diastolic CHF (congestive heart failure) (HCC) 08/18/2018   Coronary artery calcification seen on CAT scan    Pure hypercholesterolemia    Chest pain    Shortness of breath    Right foot pain 01/13/2018   Acute on chronic diastolic CHF (congestive heart failure) (HCC) 01/13/2018   Type 2 diabetes mellitus with obesity (HCC) 01/13/2018   Lower extremity cellulitis 11/07/2017   Primary osteoarthritis, left shoulder 03/05/2017   BPH without obstruction/lower urinary tract symptoms 02/22/2017   Hematuria 02/22/2017   Peripheral neuropathy 02/22/2017   Physical deconditioning 02/22/2017   Class 3 obesity    PTSD (post-traumatic stress disorder)    Anasarca 01/31/2017   Bilateral lower extremity edema 01/31/2017   OSA  (obstructive sleep apnea)    PCP:  Darnelle Elders, PA-C Pharmacy:   [id number] [place], [place] - [address] [place] [address] Phone: [phone] Fax: [phone]  [id number] [place], [place] - [address] AT [facility] & [address] [place] [address] Phone: [phone] Fax: [phone]     Social Drivers of Health (SDOH) Social History: SDOH Screenings   Food Insecurity: No Food Insecurity (10/20/2023)  Housing: Low Risk  (10/20/2023)  Transportation Needs: No Transportation Needs (10/20/2023)  Recent Concern: Transportation Needs - Unmet Transportation Needs (10/03/2023)  Utilities: Not At Risk (10/20/2023)  Alcohol  Screen: Low Risk  (06/08/2021)  Financial Resource Strain: Medium Risk (12/06/2021)   Received [facility], [facility]  Physical Activity: Unknown (12/06/2021)   Received from [facility], [facility]  Social Connections: Socially Isolated (10/20/2023)  Stress: No Stress Concern Present (01/21/2022)   Received from [facility], [facility]  Recent Concern: Stress - Stress Concern Present (11/19/2021)   Received from [facility]  Tobacco Use: Medium Risk (10/20/2023)   SDOH Interventions:     Readmission Risk Interventions    11/28/2023    2:45 PM 07/24/2023  11:56 AM 05/08/2023    4:37 PM  Readmission Risk Prevention Plan  Transportation Screening Complete Complete Complete  Medication Review Oceanographer) Complete Complete Complete  PCP or Specialist appointment within 3-5 days of discharge Complete Complete Complete  HRI or Home Care Consult Complete Complete Complete  SW Recovery Care/Counseling Consult Complete Complete Complete  Palliative Care Screening Not Applicable Not Applicable Complete  Skilled Nursing Facility Complete Complete Not Applicable

## 2023-11-28 NOTE — Progress Notes (Signed)
 PROGRESS NOTE                                                                                                                                                                                                             Patient Demographics:    Lawrence [name], is a 68 y.o. male, DOB - Dec 30, 1955, ZOX:[id number]  Outpatient Primary MD for the patient is Lawrence Elders, PA-C    LOS - 0  Admit date - 11/27/2023    Chief Complaint  Patient presents with   Level 2 FOT   Code Sepsis       Brief Narrative (HPI from H&P)    68 y.o. male with medical history significant of COPD, severe aortic stenosis status post TAVR in July 2024, seizure disorder, chronic diastolic CHF (follows Dr. Mitzie [name]), atrial fibrillation on Eliquis , CVA, gout, OSA on nighttime CPAP, anxiety, BPH, chronic venous stasis, and morbid obesity presents after having an unwitnessed fall out of his wheelchair.   Records note patient had just recently been hospitalized 3/14-3/19 for sepsis with acute respiratory failure with hypoxia secondary to influenza A with pneumonia and COPD exacerbation.   The patient, who normally uses a wheelchair for mobility, experienced a fall after leaning forward attempting to pick something off the floor with his pick-up stick and hit his head.  Denied any loss of consciousness.  He was found to be febrile was COVID-19 positive, his only complaint was right foot pain, subsequently his blood cultures came back positive for gram-positive cocci 2 out of 2 when he was admitted.   Subjective:    Lawrence [name] today has, No headache, No chest pain, No abdominal pain - No Nausea, No new weakness tingling or numbness, no cough or shortness of breath, has right foot pain.   Assessment  & Plan :   Sepsis, Incidental COVID-19 infection.  2 out of 2 blood culture positive for Enterococcus.  Source unclear, CT chest abdomen pelvis nonacute, his  only complaint is ongoing pain in his right foot which could be due to underlying gout, x-ray right foot, currently on IV daptomycin , ID following, appears nontoxic.  Continue supportive care with IV fluids and midodrine  for blood pressure, TTE and TEE pending.  Has underlying history of MR.  Monitor.   Stray of gout, ongoing right foot pain for the last few weeks,  twitching in the right leg.  Has history of gout and seizures, check EEG, uric acid level stable, hold allopurinol , continue colchicine , trial of steroid on 11/28/2023.  Check x-ray right foot along with EEG.    Incidental 19 infection.  Acute but appears to be incidental.  No cough or shortness of breath, inflammatory markers stable, not a candidate for Paxlovid due to Eliquis .  Continue to monitor.   Hypotension Acute.  Initial blood pressures noted to be as low as 77/38.  Suspect secondary to acute infection.  Blood pressures improved with IV fluids.  Continue gentle hydration, hold diuretics, add midodrine  and monitor.   Fall out of wheelchair - CT scan of the head and cervical spine did not note any acute abnormality and chronic changes as previously noted on previous MRI imaging from 3/3.  PT/OT to evaluate and treat.   COPD, without acute exacerbation at this time  Patient does not appear to have active wheezing on physical exam although decreased overall aeration. Albuterol  nebs as needed for shortness of breath/wheezing.   Paroxysmal atrial fibrillation, [place] vasc 2 score of greater than 3 on chronic anticoagulation  Patient appears to be in sinus rhythm at this time. Continue Eliquis .  Not on rate controlling agents.   Chr. Diastolic congestive heart failure - has mild fluid overload which is chronic, blood pressure too low holding diuretics.  Status post TAVR No acute issues noted per last echocardiogram from 06/2023.  TTE and TEE requested due to bacteremia.   Seizure disorder  - Continue Keppra  and lacosamide , has been having  twitching in his right lower extremity for a few days, will check EEG.   Hyperlipidemia  - Continue fenofibrate  and atorvastatin    Anxiety and depression - Continue trazodone , mirtazapine , buspirone , venlafaxine , and aripiprazole    Morbid obesity - OSA on CPAP  BMI 46.22 kg/m, follow-up with PCP weight loss, continue nighttime CPAP.   Diabetes mellitus type 2, with long-term use of insulin  -  Patient is on sliding scale insulin , Prandin , and Mounjaro .  Continue Prandin  & SSI  Lab Results  Component Value Date   HGBA1C 6.6 (H) 10/04/2023   CBG (last 3)  Recent Labs    11/27/23 1657 11/27/23 2140 11/28/23 0907  GLUCAP 105* 116* 160*          Condition - Extremely Guarded  Family Communication  :  called Lawrence [name] [phone] 11/28/2023 at 10 AM message left  Code Status :  DNR  Consults  :  ID, PCCM  PUD Prophylaxis :     Procedures  :     TEE   TTE   EEG  CT head.  C-spine.    CT head: 1.  No evidence of an acute intracranial abnormality. 2. Forehead hematoma on the right. 3. Cerebral atrophy. 4. Paranasal sinus disease as described.   CT cervical spine: 1. No evidence of an acute cervical spine fracture. 2. Grade 1 anterolisthesis at C2-C3, C3-C4 and C4-C5. 3. Levocurvature of the cervical spine. 4. Cervical spondylosis as described within the body of the report. Multifactorial severe spinal canal stenosis at C3-C4 (as was demonstrated on the recent prior cervical spine MRI of 10/09/2023). Multilevel bony neural foraminal narrowing. 5. Erosions noted along the left C3-C4 facet joint, a finding which can be seen in setting of CPPD arthropathy. 6. 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  CT Chest, abdomen and pelvis-  1. No acute findings. 2. Aortic Atherosclerosis (ICD10-I70.0) and Emphysema (ICD10-J43.9). Coronary and systemic atherosclerosis. 3.  Sigmoid colon diverticulosis. 4. Gluteal and upper thigh muscular atrophy with the exception of the hip adductor musculature.  There is some paraspinal muscular atrophy and atrophy of the latissimus dorsi muscles. 5. Dorsal stimulator device noted electrodes at the T12-L1 level.      Disposition Plan  :    Status is: Inpatient   DVT Prophylaxis  :    Place TED hose Start: 11/28/23 1000 apixaban  (ELIQUIS ) tablet 5 mg     Lab Results  Component Value Date   PLT 153 11/28/2023    Diet :  Diet Order             Diet heart healthy/carb modified Room service appropriate? Yes; Fluid consistency: Thin  Diet effective now                    Inpatient Medications  Scheduled Meds:  apixaban   5 mg Oral BID   ARIPiprazole   5 mg Oral QHS   atorvastatin   40 mg Oral QHS   busPIRone   10 mg Oral TID   colchicine   0.6 mg Oral BID   cyanocobalamin   1,000 mcg Oral Daily   fenofibrate   160 mg Oral Daily   fluticasone   2 spray Each Nare Daily   guaiFENesin   600 mg Oral BID   insulin  aspart  0-5 Units Subcutaneous QHS   insulin  aspart  0-9 Units Subcutaneous TID WC   lacosamide   150 mg Oral BID   levETIRAcetam   500 mg Oral BID   midodrine   10 mg Oral TID WC   mirtazapine   15 mg Oral QHS   prazosin   2 mg Oral QHS   pregabalin   150 mg Oral TID   repaglinide   2 mg Oral BID   rOPINIRole   0.25 mg Oral BID   sodium chloride  flush  3 mL Intravenous Q12H   traZODone   200 mg Oral QHS   umeclidinium bromide   1 puff Inhalation Daily   venlafaxine  XR  150 mg Oral Q breakfast   Continuous Infusions:  DAPTOmycin      lactated ringers      PRN Meds:.acetaminophen  **OR** acetaminophen , albuterol , bisacodyl , HYDROcodone -acetaminophen , Muscle Rub, ondansetron  **OR** ondansetron  (ZOFRAN ) IV  Antibiotics  :    Anti-infectives (From admission, onward)    Start     Dose/Rate Route Frequency Ordered Stop   11/28/23 0800  DAPTOmycin  (CUBICIN ) 1,200 mg in sodium chloride  0.9 % IVPB        1,200 mg 148 mL/hr over 30 Minutes Intravenous Daily 11/28/23 0545     11/27/23 1015  piperacillin -tazobactam (ZOSYN ) IVPB 3.375 g         3.375 g 100 mL/hr over 30 Minutes Intravenous  Once 11/27/23 1007 11/27/23 1150   11/27/23 1015  vancomycin  (VANCOCIN ) IVPB 1000 mg/200 mL premix  Status:  Discontinued        1,000 mg 200 mL/hr over 60 Minutes Intravenous  Once 11/27/23 1007 11/27/23 1011   11/27/23 1015  vancomycin  (VANCOREADY) IVPB 2000 mg/400 mL        2,000 mg 100 mL/hr over 240 Minutes Intravenous  Once 11/27/23 1012 11/27/23 1558         Objective:   Vitals:   11/27/23 2330 11/28/23 0441 11/28/23 0446 11/28/23 0910  BP: 104/61 (!) 139/90  (!) 114/58  Pulse: 95 94  (!) 104  Resp: 19 17  14   Temp: 98.5 F (36.9 C) 97.8 F (36.6 C)  99.3 F (37.4 C)  TempSrc: Oral Oral  Oral  SpO2:  91% 95%  94%  Weight:   (!) 159.7 kg   Height:        Wt Readings from Last 3 Encounters:  11/28/23 (!) 159.7 kg  10/25/23 (!) 150 kg  10/10/23 (!) 157 kg     Intake/Output Summary (Last 24 hours) at 11/28/2023 1005 Last data filed at 11/27/2023 2330 Gross per 24 hour  Intake 3 ml  Output 450 ml  Net -447 ml     Physical Exam  Awake Alert, No new F.N deficits, Normal affect [place].AT,PERRAL Supple Neck, No JVD,   Symmetrical Chest wall movement, Good air movement bilaterally, CTAB RRR,No Gallops,Rubs or new Murmurs,  +ve B.Sounds, Abd Soft, No tenderness,   Trace edema    Data Review:    Recent Labs  Lab 11/27/23 0933 11/27/23 1054 11/28/23 0435  WBC 8.1  --  6.7  HGB 13.6 13.6 12.3*  HCT 40.5 40.0 37.2*  PLT 184  --  153  MCV 91.4  --  91.0  MCH 30.7  --  30.1  MCHC 33.6  --  33.1  RDW 13.8  --  14.0  LYMPHSABS 0.6*  --   --   MONOABS 0.6  --   --   EOSABS 0.1  --   --   BASOSABS 0.0  --   --     Recent Labs  Lab 11/27/23 0933 11/27/23 0959 11/27/23 1054 11/27/23 1300 11/27/23 2109 11/28/23 0435  NA 137  --  139  --   --  137  K 3.7  --  3.6  --   --  3.2*  CL 101  --  99  --   --  104  CO2 26  --   --   --   --  26  ANIONGAP 10  --   --   --   --  7  GLUCOSE 196*  --  192*   --   --  97  BUN 14  --  16  --   --  13  CREATININE 1.12  --  1.10  --   --  1.05  AST 25  --   --   --   --   --   ALT 25  --   --   --   --   --   ALKPHOS 43  --   --   --   --   --   BILITOT 0.9  --   --   --   --   --   ALBUMIN 3.3*  --   --   --   --   --   CRP  --   --   --   --   --  4.1*  PROCALCITON  --   --   --   --  0.39 0.35  LATICACIDVEN  --  1.8  --  0.8  --   --   INR 1.1  --   --   --   --   --   BNP  --   --   --   --  74.8 94.5  CALCIUM  8.7*  --   --   --   --  8.4*      Recent Labs  Lab 11/27/23 0933 11/27/23 0959 11/27/23 1300 11/27/23 2109 11/28/23 0435  CRP  --   --   --   --  4.1*  PROCALCITON  --   --   --  0.39 0.35  LATICACIDVEN  --  1.8 0.8  --   --   INR 1.1  --   --   --   --   BNP  --   --   --  74.8 94.5  CALCIUM  8.7*  --   --   --  8.4*    --------------------------------------------------------------------------------------------------------------- Lab Results  Component Value Date   CHOL 151 06/25/2023   HDL 30 (L) 06/25/2023   LDLCALC 79 06/25/2023   TRIG 209 (H) 06/25/2023   CHOLHDL 5.0 06/25/2023    Lab Results  Component Value Date   HGBA1C 6.6 (H) 10/04/2023   No results for input(s): "TSH", "T4TOTAL", "FREET4", "T3FREE", "THYROIDAB" in the last 72 hours. No results for input(s): "VITAMINB12", "FOLATE", "FERRITIN", "TIBC", "IRON", "RETICCTPCT" in the last 72 hours. ------------------------------------------------------------------------------------------------------------------ Cardiac Enzymes No results for input(s): "CKMB", "TROPONINI", "MYOGLOBIN" in the last 168 hours.  Invalid input(s): "CK"  Micro Results Recent Results (from the past 240 hours)  Resp panel by RT-PCR (RSV, Flu A&B, Covid) Anterior Nasal Swab     Status: Abnormal   Collection Time: 11/27/23  9:33 AM   Specimen: Anterior Nasal Swab  Result Value Ref Range Status   SARS Coronavirus 2 by RT PCR POSITIVE (A) NEGATIVE Final   Influenza A by PCR  NEGATIVE NEGATIVE Final   Influenza B by PCR NEGATIVE NEGATIVE Final    Comment: (NOTE) The Xpert Xpress SARS-CoV-2/FLU/RSV plus assay is intended as an aid in the diagnosis of influenza from Nasopharyngeal swab specimens and should not be used as a sole basis for treatment. Nasal washings and aspirates are unacceptable for Xpert Xpress SARS-CoV-2/FLU/RSV testing.  Fact Sheet for Patients: BloggerCourse.com  Fact Sheet for Healthcare Providers: SeriousBroker.it  This test is not yet approved or cleared by the [place]  FDA and has been authorized for detection and/or diagnosis of SARS-CoV-2 by FDA under an Emergency Use Authorization (EUA). This EUA will remain in effect (meaning this test can be used) for the duration of the COVID-19 declaration under Section 564(b)(1) of the Act, 21 U.S.C. section 360bbb-3(b)(1), unless the authorization is terminated or revoked.     Resp Syncytial Virus by PCR NEGATIVE NEGATIVE Final    Comment: (NOTE) Fact Sheet for Patients: BloggerCourse.com  Fact Sheet for Healthcare Providers: SeriousBroker.it  This test is not yet approved or cleared by the [place]  FDA and has been authorized for detection and/or diagnosis of SARS-CoV-2 by FDA under an Emergency Use Authorization (EUA). This EUA will remain in effect (meaning this test can be used) for the duration of the COVID-19 declaration under Section 564(b)(1) of the Act, 21 U.S.C. section 360bbb-3(b)(1), unless the authorization is terminated or revoked.  Performed at [facility], [address] [place], [place] [address]   Blood Culture (routine x 2)     Status: None (Preliminary result)   Collection Time: 11/27/23  9:33 AM   Specimen: BLOOD  Result Value Ref Range Status   Specimen Description BLOOD LEFT ANTECUBITAL  Final   Special Requests   Final    BOTTLES DRAWN  AEROBIC AND ANAEROBIC Blood Culture results may not be optimal due to an inadequate volume of blood received in culture bottles   Culture  Setup Time   Final    GRAM POSITIVE COCCI IN BOTH AEROBIC AND ANAEROBIC BOTTLES CRITICAL RESULT CALLED TO, READ BACK BY AND VERIFIED WITH: Alycia Babcock A [id number] FCP Performed at [facility], [address], [place],  [place] [address]    Culture GRAM POSITIVE COCCI  Final   Report Status PENDING  Incomplete  Blood Culture ID Panel (Reflexed)     Status: Abnormal   Collection Time: 11/27/23  9:33 AM  Result Value Ref Range Status   Enterococcus faecalis NOT DETECTED NOT DETECTED Final   Enterococcus Faecium DETECTED (A) NOT DETECTED Final    Comment: CRITICAL RESULT CALLED TO, READ BACK BY AND VERIFIED WITH: PHARMD GREG A [id number] FCP      Listeria monocytogenes NOT DETECTED NOT DETECTED Final   Staphylococcus species NOT DETECTED NOT DETECTED Final   Staphylococcus aureus (BCID) NOT DETECTED NOT DETECTED Final   Staphylococcus epidermidis NOT DETECTED NOT DETECTED Final   Staphylococcus lugdunensis NOT DETECTED NOT DETECTED Final   Streptococcus species NOT DETECTED NOT DETECTED Final   Streptococcus agalactiae NOT DETECTED NOT DETECTED Final   Streptococcus pneumoniae NOT DETECTED NOT DETECTED Final   Streptococcus pyogenes NOT DETECTED NOT DETECTED Final   A.calcoaceticus-baumannii NOT DETECTED NOT DETECTED Final   Bacteroides fragilis NOT DETECTED NOT DETECTED Final   Enterobacterales NOT DETECTED NOT DETECTED Final   Enterobacter cloacae complex NOT DETECTED NOT DETECTED Final   Escherichia coli NOT DETECTED NOT DETECTED Final   Klebsiella aerogenes NOT DETECTED NOT DETECTED Final   Klebsiella oxytoca NOT DETECTED NOT DETECTED Final   Klebsiella pneumoniae NOT DETECTED NOT DETECTED Final   Proteus species NOT DETECTED NOT DETECTED Final   Salmonella species NOT DETECTED NOT DETECTED Final   Serratia marcescens NOT DETECTED NOT  DETECTED Final   Haemophilus influenzae NOT DETECTED NOT DETECTED Final   Neisseria meningitidis NOT DETECTED NOT DETECTED Final   Pseudomonas aeruginosa NOT DETECTED NOT DETECTED Final   Stenotrophomonas maltophilia NOT DETECTED NOT DETECTED Final   Candida albicans NOT DETECTED NOT DETECTED Final   Candida auris NOT DETECTED NOT DETECTED Final   Candida glabrata NOT DETECTED NOT DETECTED Final   Candida krusei NOT DETECTED NOT DETECTED Final   Candida parapsilosis NOT DETECTED NOT DETECTED Final   Candida tropicalis NOT DETECTED NOT DETECTED Final   Cryptococcus neoformans/gattii NOT DETECTED NOT DETECTED Final   Vancomycin  resistance DETECTED (A) NOT DETECTED Final    Comment: CRITICAL RESULT CALLED TO, READ BACK BY AND VERIFIED WITH: Alycia Babcock A [id number] FCP   Performed at [facility], [address], [place], [place] [address]   Blood Culture (routine x 2)     Status: None (Preliminary result)   Collection Time: 11/27/23 10:23 AM   Specimen: BLOOD  Result Value Ref Range Status   Specimen Description BLOOD RIGHT ANTECUBITAL  Final   Special Requests   Final    BOTTLES DRAWN AEROBIC AND ANAEROBIC Blood Culture results may not be optimal due to an inadequate volume of blood received in culture bottles   Culture  Setup Time   Final    GRAM POSITIVE COCCI IN BOTH AEROBIC AND ANAEROBIC BOTTLES CRITICAL VALUE NOTED.  VALUE IS CONSISTENT WITH PREVIOUSLY REPORTED AND CALLED VALUE. Performed at [facility], [address], [place], [place] [address]    Culture GRAM POSITIVE COCCI  Final   Report Status PENDING  Incomplete  MRSA Next Gen by PCR, Nasal     Status: None   Collection Time: 11/28/23  5:52 AM   Specimen: Nasal Mucosa; Nasal Swab  Result Value Ref Range Status   MRSA by PCR Next Gen NOT DETECTED NOT DETECTED Final    Comment: (NOTE) The GeneXpert MRSA  Assay (FDA approved for NASAL specimens only), is one component of a comprehensive MRSA colonization  surveillance program. It is not intended to diagnose MRSA infection nor to guide or monitor treatment for MRSA infections. Test performance is not FDA approved in patients less than 51 years old. Performed at [facility], [address], [place], [place] [address]     Radiology Report DG Chest Port 1 View Result Date: 11/28/2023 CLINICAL DATA:  Shortness of breath EXAM: PORTABLE CHEST 1 VIEW COMPARISON:  11/27/2023 FINDINGS: Cardiomegaly with vascular congestion. Diffuse interstitial prominence, favor interstitial edema. Probable layering effusions with bibasilar opacities, likely atelectasis. IMPRESSION: Cardiomegaly with vascular congestion and interstitial prominence, likely interstitial edema. Layering bilateral effusions with bibasilar atelectasis. Electronically Signed   By: Janeece Mechanic M.D.   On: 11/28/2023 10:01   CT CHEST ABDOMEN PELVIS W CONTRAST Result Date: 11/27/2023 CLINICAL DATA:  Poly trauma, fall from wheelchair. Anti coagulation. EXAM: CT CHEST, ABDOMEN, AND PELVIS WITH CONTRAST TECHNIQUE: Multidetector CT imaging of the chest, abdomen and pelvis was performed following the standard protocol during bolus administration of intravenous contrast. Due to patient body habitus, part of the abdomen was not included initial imaging, the patient was readministered contrast and reimaged with a larger field of view. RADIATION DOSE REDUCTION: This exam was performed according to the departmental dose-optimization program which includes automated exposure control, adjustment of the mA and/or kV according to patient size and/or use of iterative reconstruction technique. CONTRAST:  OMNIPAQUE  IOHEXOL  350 MG/ML SOLN COMPARISON:  10/02/2023 FINDINGS: CT CHEST FINDINGS Cardiovascular: Coronary, aortic arch, and branch vessel atherosclerotic vascular disease. TAVR noted. There is some linear high density in a left lower lobe pulmonary arterial branch on image 5 series 9, likely incidental  embolized methacrylate. Mediastinum/Nodes: Unremarkable Lungs/Pleura: Emphysema. Mild lower lobe dependent atelectasis bilaterally. Musculoskeletal: Thoracic spondylosis. CT ABDOMEN PELVIS FINDINGS Hepatobiliary: Simple right hepatic lobe cyst on image 33 series 9 warrants no further imaging workup. Gallbladder unremarkable. Pancreas: Unremarkable Spleen: Unremarkable Adrenals/Urinary Tract: Benign right renal cysts warrant no further imaging workup. Adrenal glands appear normal. Urinary bladder unremarkable. Stomach/Bowel: Sigmoid colon diverticulosis. Vascular/Lymphatic: Atherosclerosis is present, including aortoiliac atherosclerotic disease. Atheromatous plaque proximally in the SMA without overt occlusion. Reproductive: Unremarkable Other: No supplemental non-categorized findings. Musculoskeletal: Dorsal stimulator device noted electrodes at the T12-L1 level. Gluteal and upper thigh muscular atrophy with the exception of the hip adductor musculature. There is some paraspinal muscular atrophy and atrophy of the latissimus dorsi muscles. IMPRESSION: 1. No acute findings. 2. Aortic Atherosclerosis (ICD10-I70.0) and Emphysema (ICD10-J43.9). Coronary and systemic atherosclerosis. 3. Sigmoid colon diverticulosis. 4. Gluteal and upper thigh muscular atrophy with the exception of the hip adductor musculature. There is some paraspinal muscular atrophy and atrophy of the latissimus dorsi muscles. 5. Dorsal stimulator device noted electrodes at the T12-L1 level. Electronically Signed   By: Freida Jes M.D.   On: 11/27/2023 16:05   CT Head Wo Contrast Result Date: 11/27/2023 CLINICAL DATA:  Provided history: Head trauma, GCS = 15, no focal neuro findings. Neck trauma. Additional history provided: Fall on blood thinners. EXAM: CT HEAD WITHOUT CONTRAST CT CERVICAL SPINE WITHOUT CONTRAST TECHNIQUE: Multidetector CT imaging of the head and cervical spine was performed following the standard protocol without  intravenous contrast. Multiplanar CT image reconstructions of the cervical spine were also generated. RADIATION DOSE REDUCTION: This exam was performed according to the departmental dose-optimization program which includes automated exposure control, adjustment of the mA and/or kV according to patient size and/or use of  iterative reconstruction technique. COMPARISON:  Prior head CT examinations 10/10/2023 and earlier. Cervical spine MRI 10/09/2023. FINDINGS: CT HEAD FINDINGS Brain: Generalized cerebral atrophy. There is no acute intracranial hemorrhage. No demarcated cortical infarct. No extra-axial fluid collection. No evidence of an intracranial mass. No midline shift. Vascular: No hyperdense vessel.  Atherosclerotic calcifications. Skull: No calvarial fracture or aggressive osseous lesion. Sinuses/Orbits: No mass or acute finding within the imaged orbits. Minimal mucosal thickening within the bilateral maxillary sinuses. Trace mucosal thickening within the right sphenoid sinus. Mild-to-moderate mucosal thickening within the left sphenoid sinus. Mild-to-moderate bilateral ethmoid sinusitis. Mild-to-moderate mucosal thickening within the left frontal sinus. Other: Forehead hematoma on the right. CT CERVICAL SPINE FINDINGS Alignment: Levocurvature of the cervical spine. 3 mm grade 1 anterolisthesis at C2-C3, C3-C4 and C4-C5. Skull base and vertebrae: The basion-dental and atlanto-dental intervals are maintained.No evidence of acute fracture to the cervical spine. 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 Soft tissues and spinal canal: No prevertebral fluid or swelling. No visible canal hematoma. Disc levels: Cervical spondylosis with multilevel disc space narrowing, disc bulges/central disc protrusions, posterior disc osteophyte complexes, uncovertebral hypertrophy and facet arthropathy. Erosions are noted along the left C3-C4 facet joint. Disc degeneration is greatest at C5-C6, C6-C7 and C7-T1 (advanced at these  levels). Multilevel spinal canal narrowing. Most notably at C3-C4, a disc bulge contributes to multifactorial severe spinal canal stenosis (as was demonstrated on the recent prior cervical spine MRI of 10/09/2023). Multilevel bony foraminal stenosis. Degenerative changes also present at the C1-C2 articulation. Upper chest: No consolidation within the imaged lung apices. No visible pneumothorax. IMPRESSION: CT head: 1.  No evidence of an acute intracranial abnormality. 2. Forehead hematoma on the right. 3. Cerebral atrophy. 4. Paranasal sinus disease as described. CT cervical spine: 1. No evidence of an acute cervical spine fracture. 2. Grade 1 anterolisthesis at C2-C3, C3-C4 and C4-C5. 3. Levocurvature of the cervical spine. 4. Cervical spondylosis as described within the body of the report. Multifactorial severe spinal canal stenosis at C3-C4 (as was demonstrated on the recent prior cervical spine MRI of 10/09/2023). Multilevel bony neural foraminal narrowing. 5. Erosions noted along the left C3-C4 facet joint, a finding which can be seen in setting of CPPD arthropathy. 6. 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 Electronically Signed   By: Bascom Lily D.O.   On: 11/27/2023 13:27   CT Cervical Spine Wo Contrast Result Date: 11/27/2023 CLINICAL DATA:  Provided history: Head trauma, GCS = 15, no focal neuro findings. Neck trauma. Additional history provided: Fall on blood thinners. EXAM: CT HEAD WITHOUT CONTRAST CT CERVICAL SPINE WITHOUT CONTRAST TECHNIQUE: Multidetector CT imaging of the head and cervical spine was performed following the standard protocol without intravenous contrast. Multiplanar CT image reconstructions of the cervical spine were also generated. RADIATION DOSE REDUCTION: This exam was performed according to the departmental dose-optimization program which includes automated exposure control, adjustment of the mA and/or kV according to patient size and/or use of iterative reconstruction technique.  COMPARISON:  Prior head CT examinations 10/10/2023 and earlier. Cervical spine MRI 10/09/2023. FINDINGS: CT HEAD FINDINGS Brain: Generalized cerebral atrophy. There is no acute intracranial hemorrhage. No demarcated cortical infarct. No extra-axial fluid collection. No evidence of an intracranial mass. No midline shift. Vascular: No hyperdense vessel.  Atherosclerotic calcifications. Skull: No calvarial fracture or aggressive osseous lesion. Sinuses/Orbits: No mass or acute finding within the imaged orbits. Minimal mucosal thickening within the bilateral maxillary sinuses. Trace mucosal thickening within the right sphenoid sinus. Mild-to-moderate mucosal thickening  within the left sphenoid sinus. Mild-to-moderate bilateral ethmoid sinusitis. Mild-to-moderate mucosal thickening within the left frontal sinus. Other: Forehead hematoma on the right. CT CERVICAL SPINE FINDINGS Alignment: Levocurvature of the cervical spine. 3 mm grade 1 anterolisthesis at C2-C3, C3-C4 and C4-C5. Skull base and vertebrae: The basion-dental and atlanto-dental intervals are maintained.No evidence of acute fracture to the cervical spine. 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 Soft tissues and spinal canal: No prevertebral fluid or swelling. No visible canal hematoma. Disc levels: Cervical spondylosis with multilevel disc space narrowing, disc bulges/central disc protrusions, posterior disc osteophyte complexes, uncovertebral hypertrophy and facet arthropathy. Erosions are noted along the left C3-C4 facet joint. Disc degeneration is greatest at C5-C6, C6-C7 and C7-T1 (advanced at these levels). Multilevel spinal canal narrowing. Most notably at C3-C4, a disc bulge contributes to multifactorial severe spinal canal stenosis (as was demonstrated on the recent prior cervical spine MRI of 10/09/2023). Multilevel bony foraminal stenosis. Degenerative changes also present at the C1-C2 articulation. Upper chest: No consolidation within the imaged lung  apices. No visible pneumothorax. IMPRESSION: CT head: 1.  No evidence of an acute intracranial abnormality. 2. Forehead hematoma on the right. 3. Cerebral atrophy. 4. Paranasal sinus disease as described. CT cervical spine: 1. No evidence of an acute cervical spine fracture. 2. Grade 1 anterolisthesis at C2-C3, C3-C4 and C4-C5. 3. Levocurvature of the cervical spine. 4. Cervical spondylosis as described within the body of the report. Multifactorial severe spinal canal stenosis at C3-C4 (as was demonstrated on the recent prior cervical spine MRI of 10/09/2023). Multilevel bony neural foraminal narrowing. 5. Erosions noted along the left C3-C4 facet joint, a finding which can be seen in setting of CPPD arthropathy. 6. 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 Electronically Signed   By: Bascom Lily D.O.   On: 11/27/2023 13:27   DG Chest Port 1 View Result Date: 11/27/2023 CLINICAL DATA:  Sepsis EXAM: PORTABLE CHEST 1 VIEW COMPARISON:  10/20/2023. FINDINGS: Enlarged cardiopericardial silhouette. Tortuous ectatic aorta. No consolidation, pneumothorax or effusion. No edema. Prominent central vasculature. Film is under penetrated. Overlapping cardiac leads. Metallic focus along the left side of the heart. Status post TAVR. IMPRESSION: Underinflation with enlarged cardiopericardial silhouette. Prior TAVR. Electronically Signed   By: Adrianna Horde M.D.   On: 11/27/2023 11:30     Signature  -   Lynnwood Sauer M.D on 11/28/2023 at 10:05 AM   -  To page go to [email or web address]

## 2023-11-28 NOTE — Evaluation (Signed)
 Occupational Therapy Evaluation Patient Details Name: Lawrence [name] MRN: [id number] DOB: Dec 30, 1955 Today's Date: 11/28/2023   History of Present Illness   Pt is a 68 y/o M admitted on 11/27/23 after presenting with c/o unwitnessed fall out of w/c. Pt tested positive for COVID-19. Pt also found to have acute hypotension. Of note, pt with recent admission 3/14-3/19/25 for ARF with hypoxia 2/2 Flu A, PNA & COPD exacerbation. PMH: COPD, aortic stenosis s/p TAVR July 2024, seizure disorder, chronic diastolic CHF, a-fib on Eliquis , CVA, gout, anxiety, BPH, chronic venous statis, morbid obesity, depression, neuropathy, HTN, PTSD, DM2     Clinical Impressions PTA, pt from [facility] where he is currently working with therapy but also transitioning to LTC. Pt reports transferring via scoot transfers vs sit to stand lift at SNF and receives assist for LB ADLs. Pt presents now with deficits in cardiopulmonary tolerance, strength and endurance. Pt unable to fully stand from elevated bed despite +2 assist but able to demonstrate scoot transfer with Mod A x 2. Lift pad placed in chair for optimal transfer back to bed. Pt requiring Min A for UB ADL and Mod-Total A for LB ADLs (lateral leans vs bed level). Recommend return to [employer] at DC.     If plan is discharge home, recommend the following:   A lot of help with walking and/or transfers;Two people to help with walking and/or transfers;A lot of help with bathing/dressing/bathroom;Two people to help with bathing/dressing/bathroom     Functional Status Assessment   Patient has had a recent decline in their functional status and demonstrates the ability to make significant improvements in function in a reasonable and predictable amount of time.     Equipment Recommendations   None recommended by OT     Recommendations for Other Services         Precautions/Restrictions   Precautions Precautions: Fall Restrictions Weight Bearing  Restrictions Per Provider Order: No     Mobility Bed Mobility Overal bed mobility: Needs Assistance Bed Mobility: Supine to Sit     Supine to sit: Supervision, HOB elevated, Used rails          Transfers Overall transfer level: Needs assistance Equipment used: Rolling walker (2 wheels) Transfers: Sit to/from Stand, Bed to chair/wheelchair/BSC Sit to Stand: Max assist, +2 physical assistance, From elevated surface          Lateral/Scoot Transfers: Mod assist, +2 physical assistance, +2 safety/equipment, From elevated surface General transfer comment: Attempted STS from elevated EOB with RW, pt unable to come to full upright standing 2/2 BLE weakness. Pt transfers bed>drop arm recliner via lateral scoot with cuing re: head/hips relationship, assistance to scoot buttocks.      Balance Overall balance assessment: Needs assistance Sitting-balance support: Feet supported, Bilateral upper extremity supported Sitting balance-Leahy Scale: Fair     Standing balance support: During functional activity, Bilateral upper extremity supported, Reliant on assistive device for balance Standing balance-Leahy Scale: Zero                             ADL either performed or assessed with clinical judgement   ADL Overall ADL's : Needs assistance/impaired Eating/Feeding: Independent   Grooming: Set up;Sitting;Wash/dry face   Upper Body Bathing: Minimal assistance;Sitting   Lower Body Bathing: Moderate assistance;Sitting/lateral leans;Bed level   Upper Body Dressing : Minimal assistance;Sitting   Lower Body Dressing: Maximal assistance;Sitting/lateral leans;Bed level       Toileting- Clothing Manipulation  and Hygiene: Total assistance;Bed level         General ADL Comments: Unable to complete LB ADLs in standing due to inability to stand fully upright     Vision Ability to See in Adequate Light: 0 Adequate Patient Visual Report: No change from baseline Vision  Assessment?: No apparent visual deficits     Perception         Praxis         Pertinent Vitals/Pain Pain Assessment Pain Assessment: No/denies pain     Extremity/Trunk Assessment Upper Extremity Assessment Upper Extremity Assessment: Overall WFL for tasks assessed;Right hand dominant   Lower Extremity Assessment Lower Extremity Assessment: Defer to PT evaluation   Cervical / Trunk Assessment Cervical / Trunk Assessment: Normal   Communication Communication Communication: No apparent difficulties   Cognition Arousal: Alert Behavior During Therapy: WFL for tasks assessed/performed Cognition: No apparent impairments                               Following commands: Intact       Cueing  General Comments   Cueing Techniques: Verbal cues  IV disconnected so paused IV, notified front desk via call bell.   Exercises     Shoulder Instructions      Home Living Family/patient expects to be discharged to:: Skilled nursing facility                                 Additional Comments: Was at [facility] for short term rehab, working on transitioning to long term care.      Prior Functioning/Environment Prior Level of Function : Needs assist             Mobility Comments: Mod I for bed mobility in hospital bed, 1 assist for lateral scoots elevated bed>low w/c, sit<>stand lift w/c>bed ADLs Comments: Pt reports at [facility] he was recieveing assistance from facility staff for most ADLs. He reports attempting to assist with any activities as much as he could. Using bedpan vs sit to stand lift for toileting if already up in w/c    OT Problem List: Decreased strength;Decreased activity tolerance;Impaired balance (sitting and/or standing);Cardiopulmonary status limiting activity   OT Treatment/Interventions: Self-care/ADL training;Therapeutic exercise;Energy conservation;DME and/or AE instruction;Therapeutic activities;Patient/family  education;Balance training      OT Goals(Current goals can be found in the care plan section)   Acute Rehab OT Goals Patient Stated Goal: be able to walk again, continue working with therapy OT Goal Formulation: With patient Time For Goal Achievement: 12/12/23 Potential to Achieve Goals: Good   OT Frequency:  Min 1X/week    Co-evaluation PT/OT/SLP Co-Evaluation/Treatment: Yes Reason for Co-Treatment: For patient/therapist safety;To address functional/ADL transfers PT goals addressed during session: Mobility/safety with mobility;Balance OT goals addressed during session: ADL's and self-care;Proper use of Adaptive equipment and DME      AM-PAC OT "6 Clicks" Daily Activity     Outcome Measure Help from another person eating meals?: None Help from another person taking care of personal grooming?: A Little Help from another person toileting, which includes using toliet, bedpan, or urinal?: Total Help from another person bathing (including washing, rinsing, drying)?: A Lot Help from another person to put on and taking off regular upper body clothing?: A Little Help from another person to put on and taking off regular lower body clothing?: Total 6 Click Score: 14  End of Session Equipment Utilized During Treatment: Oxygen  Activity Tolerance: Patient tolerated treatment well Patient left: in chair;with call bell/phone within reach;with chair alarm set  OT Visit Diagnosis: Unsteadiness on feet (R26.81);Other abnormalities of gait and mobility (R26.89);Muscle weakness (generalized) (M62.81)                Time: 1140-1210 OT Time Calculation (min): 30 min Charges:  OT General Charges $OT Visit: 1 Visit OT Evaluation $OT Eval Moderate Complexity: 1 Mod  Lawrence Pretty, OTR/L Acute Rehab Services Office: [phone]   Annabella Barr 11/28/2023, 12:37 PM

## 2023-11-28 NOTE — Progress Notes (Addendum)
 Pharmacy Antibiotic Note  Lawrence [name] is a 68 y.o. male admitted on 11/27/2023 with VRE bacteremia. Pharmacy has been consulted for Daptomycin  dosing.  Optimal Daptomycin  dosing for VRE is 10-12 mg/kg, given BMI 45 will utilize an adjusted body weight. Baseline CK 67. Will hold atorvastatin  for now to reduce rhabdo risk - discussed with Dr. Zelda Hickman.   Hx TAVR - likely will need TEE for eval for IE however since delayed will add Ampicillin  for synergy.  Plan: - Adjust Daptomycin  to 1100 mg IV every 24 hours (~10 mg/kg AdjBW) - Add Ampicillin  2g IV every 4 hours - Hold atorvastatin  while on high-dose Daptomycin  for VRE - Will check CK 1-2x weekly - next on Friday - Will continue to follow renal function, culture results, LOT, and antibiotic de-escalation plans   Height: 6\' 2"  (188 cm) Weight: (!) 159.7 kg (352 lb 1.2 oz) IBW/kg (Calculated) : 82.2  Temp (24hrs), Avg:99.1 F (37.3 C), Min:97.8 F (36.6 C), Max:100.1 F (37.8 C)  Recent Labs  Lab 11/27/23 0933 11/27/23 0959 11/27/23 1054 11/27/23 1300 11/28/23 0435  WBC 8.1  --   --   --  6.7  CREATININE 1.12  --  1.10  --  1.05  LATICACIDVEN  --  1.8  --  0.8  --     Estimated Creatinine Clearance: 110.8 mL/min (by C-G formula based on SCr of 1.05 mg/dL).    Allergies  Allergen Reactions   Niacin And Related Other (See Comments)    Redness, flushing   Ppd [Tuberculin Purified Protein Derivative]     Unknown rxn (MAR)   Tubersol [Tuberculin, Ppd] Other (See Comments)    Unknown rxn (MAR)   Firvanq  [Vancomycin ] Other (See Comments)    Red man syndrome   Vibramycin  [Doxycycline ] Rash    Antimicrobials this admission: Vancomycin  4/21 x 1 Zosyn  4/21 x 1 Daptomycin  4/22 >>  Dose adjustments this admission:   Microbiology results: 4/21 COVID >> positive 4/21 BCx >> 4/4 GPC (BCID VRE) 4/22 MRSA PCR >> neg  Thank you for allowing pharmacy to be a part of this patient's care.  Garland Junk, PharmD, BCPS,  BCIDP Infectious Diseases Clinical Pharmacist 11/28/2023 2:18 PM   **Pharmacist phone directory can now be found on [email or web address] ([email or web address]).  Listed under [facility].

## 2023-11-28 NOTE — Progress Notes (Incomplete)
 Dapto MIC

## 2023-11-28 NOTE — Plan of Care (Signed)
  Problem: Clinical Measurements: Goal: Ability to maintain clinical measurements within normal limits will improve Outcome: Progressing Goal: Will remain free from infection Outcome: Progressing Goal: Respiratory complications will improve Outcome: Progressing   Problem: Activity: Goal: Risk for activity intolerance will decrease Outcome: Progressing   Problem: Pain Managment: Goal: General experience of comfort will improve and/or be controlled Outcome: Progressing

## 2023-11-28 NOTE — Progress Notes (Signed)
Patient declined CPAP. No unit in room at this time. °

## 2023-11-28 NOTE — Progress Notes (Signed)
Routine EEG complete. Results pending.

## 2023-11-28 NOTE — Evaluation (Signed)
 Physical Therapy Evaluation Patient Details Name: Lawrence [name] MRN: [id number] DOB: 01-14-1956 Today's Date: 11/28/2023  History of Present Illness  Pt is a 68 y/o M admitted on 11/27/23 after presenting with c/o unwitnessed fall out of w/c. Pt tested positive for COVID-19. Pt also found to have acute hypotension. Of note, pt with recent admission 3/14-3/19/25 for ARF with hypoxia 2/2 Flu A, PNA & COPD exacerbation. PMH: COPD, aortic stenosis s/p TAVR July 2024, seizure disorder, chronic diastolic CHF, a-fib on Eliquis , CVA, gout, anxiety, BPH, chronic venous statis, morbid obesity, depression, neuropathy, HTN, PTSD, DM2  Clinical Impression  Pt seen for PT evaluation with co-tx with OT. Pt reports he was at [facility] but working on transitioning to LTC. Pt reports actively working with therapy, performing lateral scoot transfers from elevated to lower surfaces with 1 assist but using sit<>stand lift to transfer out of w/c. On this date, pt attempts sit<>stand from elevated EOB but unable to come to full upright standing. Pt performs lateral scoot elevated bed>lower drop arm recliner with mod assist +2 with cuing re: head hips relationship & assistance for scooting. Pt does note fatigue during session but is agreeable to continue participating & motivated to get better. Will continue to follow pt acutely to progress mobility as able.        If plan is discharge home, recommend the following: Two people to help with walking and/or transfers;Two people to help with bathing/dressing/bathroom   Can travel by private vehicle   No    Equipment Recommendations Other (comment) (defer to next venue)  Recommendations for Other Services       Functional Status Assessment Patient has had a recent decline in their functional status and demonstrates the ability to make significant improvements in function in a reasonable and predictable amount of time.     Precautions / Restrictions  Precautions Precautions: Fall Restrictions Weight Bearing Restrictions Per Provider Order: No      Mobility  Bed Mobility Overal bed mobility: Needs Assistance Bed Mobility: Supine to Sit     Supine to sit: Supervision, HOB elevated, Used rails (exit R side of bed)          Transfers Overall transfer level: Needs assistance Equipment used: Rolling walker (2 wheels) Transfers: Sit to/from Stand, Bed to chair/wheelchair/BSC Sit to Stand: Max assist, +2 physical assistance, From elevated surface          Lateral/Scoot Transfers: Mod assist, +2 physical assistance, +2 safety/equipment, From elevated surface General transfer comment: Attempted STS from elevated EOB with RW, pt unable to come to full upright standing 2/2 BLE weakness. Pt transfers bed>drop arm recliner via lateral scoot with cuing re: head/hips relationship, assistance to scoot buttocks.    Ambulation/Gait                  Stairs            Wheelchair Mobility     Tilt Bed    Modified Rankin (Stroke Patients Only)       Balance Overall balance assessment: Needs assistance Sitting-balance support: Feet supported, Bilateral upper extremity supported Sitting balance-Leahy Scale: Fair     Standing balance support: During functional activity, Bilateral upper extremity supported, Reliant on assistive device for balance Standing balance-Leahy Scale: Zero                               Pertinent Vitals/Pain Pain Assessment Pain Assessment: No/denies  pain    Home Living Family/patient expects to be discharged to:: Skilled nursing facility                   Additional Comments: Was at [facility] for short term rehab, working on transitioning to long term care.    Prior Function Prior Level of Function : Needs assist             Mobility Comments: Mod I for bed mobility in hospital bed, 1 assist for lateral scoots elevated bed>low w/c, sit<>stand lift w/c>bed        Extremity/Trunk Assessment   Upper Extremity Assessment Upper Extremity Assessment: Overall WFL for tasks assessed;Defer to OT evaluation    Lower Extremity Assessment Lower Extremity Assessment: Generalized weakness (LLE spasming throughout session while pt lying in bed (hip & knee flexes, ankle dorsiflexes) pt reports this is involuntary & has been happening since he was admitted for the flu, reports it's 2/2 gout)       Communication        Cognition Arousal: Alert Behavior During Therapy: WFL for tasks assessed/performed   PT - Cognitive impairments: No apparent impairments                         Following commands: Intact       Cueing Cueing Techniques: Verbal cues     General Comments General comments (skin integrity, edema, etc.): IV disconnected so paused, called nurse to room    Exercises     Assessment/Plan    PT Assessment Patient needs continued PT services  PT Problem List Decreased strength;Cardiopulmonary status limiting activity;Decreased activity tolerance;Decreased balance;Decreased mobility;Decreased range of motion;Decreased knowledge of use of DME       PT Treatment Interventions Balance training;DME instruction;Modalities;Gait training;Neuromuscular re-education;Cognitive remediation;Functional mobility training;Therapeutic activities;Wheelchair mobility training;Patient/family education;Therapeutic exercise;Manual techniques    PT Goals (Current goals can be found in the Care Plan section)  Acute Rehab PT Goals Patient Stated Goal: get stronger, walk again PT Goal Formulation: With patient Time For Goal Achievement: 12/12/23 Potential to Achieve Goals: Poor    Frequency Min 2X/week     Co-evaluation PT/OT/SLP Co-Evaluation/Treatment: Yes Reason for Co-Treatment: For patient/therapist safety;To address functional/ADL transfers PT goals addressed during session: Mobility/safety with mobility;Balance         AM-PAC  PT "6 Clicks" Mobility  Outcome Measure Help needed turning from your back to your side while in a flat bed without using bedrails?: A Little Help needed moving from lying on your back to sitting on the side of a flat bed without using bedrails?: A Lot Help needed moving to and from a bed to a chair (including a wheelchair)?: Total Help needed standing up from a chair using your arms (e.g., wheelchair or bedside chair)?: Total Help needed to walk in hospital room?: Total Help needed climbing 3-5 steps with a railing? : Total 6 Click Score: 9    End of Session Equipment Utilized During Treatment: Gait belt;Oxygen Activity Tolerance: Patient tolerated treatment well;Patient limited by fatigue Patient left: in chair;with chair alarm set;with call bell/phone within reach Nurse Communication: Mobility status PT Visit Diagnosis: Muscle weakness (generalized) (M62.81);Other abnormalities of gait and mobility (R26.89);History of falling (Z91.81)    Time: 1610-9604 PT Time Calculation (min) (ACUTE ONLY): 31 min   Charges:   PT Evaluation $PT Eval Moderate Complexity: 1 Mod   PT General Charges $$ ACUTE PT VISIT: 1 Visit  Emaline Handsome, PT, DPT 11/28/23, 12:31 PM  Venetta Gill 11/28/2023, 12:30 PM

## 2023-11-28 NOTE — [place] FL2 (Signed)
 [place]  MEDICAID FL2 LEVEL OF CARE FORM     IDENTIFICATION  Patient Name: Lawrence [name] Birthdate: 11/19/1955 Sex: male Admission Date (Current Location): 11/27/2023  [facility] and [place] Number:  Producer, television/film/video and Address:  [facility], [address] [place], [place] [address]      Provider Number: [id number]  Attending Physician Name and Address:  Cala Castleman, MD  Relative Name and Phone Number:       Current Level of Care: Hospital Recommended Level of Care: Skilled Nursing Facility Prior Approval Number:    Date Approved/Denied:   PASRR Number: [id number]  Discharge Plan: SNF    Current Diagnoses: Patient Active Problem List   Diagnosis Date Noted   COVID-19 virus infection 11/27/2023   Fever 11/27/2023   Fall from wheelchair 11/27/2023   Hyperlipidemia 11/27/2023   Acute respiratory failure with hypoxia (HCC) 10/20/2023   Influenza A with pneumonia 10/20/2023   Altered mental status 10/05/2023   MDD (major depressive disorder), recurrent severe, without psychosis (HCC) 10/05/2023   Hypoglycemia 10/03/2023   SIRS (systemic inflammatory response syndrome) (HCC) 10/03/2023   History of COPD 10/03/2023   History of seizures 10/03/2023   Multiple falls 07/21/2023   Fall 07/15/2023   Acute metabolic encephalopathy 07/14/2023   Obesity, Class III, BMI 40-49.9 (morbid obesity) (HCC) 07/14/2023   History of CVA with residual deficit 07/14/2023   Fall at home, initial encounter 07/14/2023   Gout 07/12/2023   Constipation 07/09/2023   Polyneuropathy 07/04/2023   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   Acute kidney injury superimposed on stage 3a chronic kidney disease (HCC) 07/03/2023   Hypotension 07/03/2023   Stage 3a chronic kidney disease (HCC) 06/30/2023   Acute stroke due to occlusion of left cerebellar artery (HCC) 06/28/2023   Type 2 diabetes mellitus with diabetic polyneuropathy, with long-term  current use of insulin  (HCC) 06/28/2023   CVA (cerebral vascular accident) (HCC) 06/25/2023   Atrial fibrillation, chronic (HCC) 06/25/2023   Acute CVA (cerebrovascular accident) (HCC) 06/25/2023   S/P TAVR (transcatheter aortic valve replacement) 02/07/2023   Acute respiratory failure with hypoxia and hypercapnia (HCC) 01/23/2023   Nail, injury by, initial encounter 12/09/2022   Heart failure (HCC) 10/27/2022   Gout attack 10/27/2022   CHF (congestive heart failure) (HCC) 10/26/2022   Right sided weakness 05/26/2022   Dysarthria 05/25/2022   Ischemic stroke (HCC) 02/25/2022   Hemiparesis affecting left side as late effect of cerebrovascular accident (CVA) (HCC) 02/25/2022   Durable power of attorney for healthcare exists  02/25/2022   Depression 08/07/2021   Paronychia of great toe, left 07/07/2021   Stroke-like symptoms 04/27/2021   Ambulatory dysfunction 04/27/2021   Left-sided weakness 02/09/2021   Atrial fibrillation with rapid ventricular response (HCC) 02/08/2021   AF (paroxysmal atrial fibrillation) (HCC) 01/25/2021   COPD (chronic obstructive pulmonary disease) (HCC) 01/25/2021   Aortic stenosis 01/21/2021   Acquired dilation of ascending aorta and aortic root (HCC) 01/21/2021   Coronary artery disease due to lipid rich plaque    SVT (supraventricular tachycardia) (HCC)    Seizure disorder (HCC)    Primary hypertension 10/17/2020   Hypokalemia 10/17/2020   Cellulitis of right leg 12/04/2019   Pain due to onychomycosis of toenails of both feet 09/11/2019   Diabetic neuropathy (HCC) 09/11/2019   DM2 (diabetes mellitus, type 2) (HCC) 09/11/2019   Sinus tachycardia 03/07/2019   Chronic venous stasis 03/07/2019   History of anemia 12/26/2018   Anemia 08/24/2018  History of colon polyps 08/24/2018   Do not resuscitate status 08/24/2018   Chronic diastolic CHF (congestive heart failure) (HCC) 08/18/2018   Coronary artery calcification seen on CAT scan    Pure  hypercholesterolemia    Chest pain    Shortness of breath    Right foot pain 01/13/2018   Acute on chronic diastolic CHF (congestive heart failure) (HCC) 01/13/2018   Type 2 diabetes mellitus with obesity (HCC) 01/13/2018   Lower extremity cellulitis 11/07/2017   Primary osteoarthritis, left shoulder 03/05/2017   BPH without obstruction/lower urinary tract symptoms 02/22/2017   Hematuria 02/22/2017   Peripheral neuropathy 02/22/2017   Physical deconditioning 02/22/2017   Class 3 obesity    PTSD (post-traumatic stress disorder)    Anasarca 01/31/2017   Bilateral lower extremity edema 01/31/2017   OSA (obstructive sleep apnea)     Orientation RESPIRATION BLADDER Height & Weight     Self, Time, Situation, Place  O2 (3L nasal cannula) Continent, External catheter Weight: (!) 352 lb 1.2 oz (159.7 kg) Height:  6\' 2"  (188 cm)  BEHAVIORAL SYMPTOMS/MOOD NEUROLOGICAL BOWEL NUTRITION STATUS      Continent Diet  AMBULATORY STATUS COMMUNICATION OF NEEDS Skin   Extensive Assist Verbally Normal                       Personal Care Assistance Level of Assistance  Bathing, Feeding, Dressing Bathing Assistance: Maximum assistance Feeding assistance: Limited assistance Dressing Assistance: Maximum assistance     Functional Limitations Info             SPECIAL CARE FACTORS FREQUENCY  PT (By licensed PT), OT (By licensed OT)     PT Frequency: eval and treat OT Frequency: eval and treat            Contractures Contractures Info: Not present    Additional Factors Info  Code Status, Allergies, Isolation Precautions Code Status Info: DNR Allergies Info: Niacin And Related, Ppd (Tuberculin Purified Protein Derivative), Tubersol (Tuberculin, Ppd), Firvanq  (Vancomycin ), Vibramycin  (Doxycycline )     Isolation Precautions Info: COVID+     Current Medications (11/28/2023):  This is the current hospital active medication list Current Facility-Administered Medications  Medication  Dose Route Frequency Provider Last Rate Last Admin   acetaminophen  (TYLENOL ) tablet 650 mg  650 mg Oral Q6H PRN Smith, Rondell A, MD       Or   acetaminophen  (TYLENOL ) suppository 650 mg  650 mg Rectal Q6H PRN Manny Sees A, MD       albuterol  (PROVENTIL ) (2.5 MG/3ML) 0.083% nebulizer solution 2.5 mg  2.5 mg Nebulization Q4H PRN Smith, Rondell A, MD       ampicillin  (OMNIPEN) 2 g in sodium chloride  0.9 % 100 mL IVPB  2 g Intravenous Q4H Liane Redman, MD       apixaban  (ELIQUIS ) tablet 5 mg  5 mg Oral BID Smith, Rondell A, MD   5 mg at 11/28/23 4259   ARIPiprazole  (ABILIFY ) tablet 5 mg  5 mg Oral QHS Smith, Rondell A, MD   5 mg at 11/27/23 2130   bisacodyl  (DULCOLAX) EC tablet 10 mg  10 mg Oral Daily PRN Singh, Prashant K, MD       busPIRone  (BUSPAR ) tablet 10 mg  10 mg Oral TID Smith, Rondell A, MD   10 mg at 11/28/23 5638   colchicine  tablet 0.6 mg  0.6 mg Oral BID Singh, Prashant K, MD   0.6 mg at 11/28/23 7564   cyanocobalamin  (VITAMIN  B12) tablet 1,000 mcg  1,000 mcg Oral Daily Singh, Prashant K, MD   1,000 mcg at 11/28/23 1128   [START ON 11/29/2023] DAPTOmycin  (CUBICIN ) 1,100 mg in sodium chloride  0.9 % IVPB  10 mg/kg (Adjusted) Intravenous Q1400 Sierra Dresser, [facility]       fenofibrate  tablet 160 mg  160 mg Oral Daily Smith, Rondell A, MD   160 mg at 11/28/23 9562   fluticasone  (FLONASE ) 50 MCG/ACT nasal spray 2 spray  2 spray Each Nare Daily Manny Sees A, MD   2 spray at 11/28/23 1308   guaiFENesin  (MUCINEX ) 12 hr tablet 600 mg  600 mg Oral BID Smith, Rondell A, MD   600 mg at 11/28/23 6578   HYDROcodone -acetaminophen  (NORCO/VICODIN) 5-325 MG per tablet 1 tablet  1 tablet Oral Q6H PRN Smith, Rondell A, MD   1 tablet at 11/27/23 2133   insulin  aspart (novoLOG ) injection 0-5 Units  0-5 Units Subcutaneous QHS Smith, Rondell A, MD       insulin  aspart (novoLOG ) injection 0-9 Units  0-9 Units Subcutaneous TID WC Smith, Rondell A, MD   2 Units at 11/28/23 1317   lacosamide  (VIMPAT )  tablet 150 mg  150 mg Oral BID Smith, Rondell A, MD   150 mg at 11/28/23 4696   lactated ringers  infusion   Intravenous Continuous Singh, Prashant K, MD       levETIRAcetam  (KEPPRA ) tablet 500 mg  500 mg Oral BID Smith, Rondell A, MD   500 mg at 11/28/23 2952   midodrine  (PROAMATINE ) tablet 10 mg  10 mg Oral TID WC Singh, Prashant K, MD   10 mg at 11/28/23 1128   mirtazapine  (REMERON ) tablet 15 mg  15 mg Oral QHS Smith, Rondell A, MD   15 mg at 11/27/23 2131   Muscle Rub CREA 1 Application  1 Application Topical PRN Lena Qualia, MD   1 Application at 11/27/23 2327   ondansetron  (ZOFRAN ) tablet 4 mg  4 mg Oral Q6H PRN Smith, Rondell A, MD       Or   ondansetron  (ZOFRAN ) injection 4 mg  4 mg Intravenous Q6H PRN Smith, Rondell A, MD       prazosin  (MINIPRESS ) capsule 2 mg  2 mg Oral QHS Smith, Rondell A, MD       pregabalin  (LYRICA ) capsule 150 mg  150 mg Oral TID Smith, Rondell A, MD   150 mg at 11/28/23 8413   repaglinide  (PRANDIN ) tablet 2 mg  2 mg Oral BID Smith, Rondell A, MD   2 mg at 11/28/23 2440   rOPINIRole  (REQUIP ) tablet 0.25 mg  0.25 mg Oral BID Smith, Rondell A, MD   0.25 mg at 11/28/23 1027   sodium chloride  flush (NS) 0.9 % injection 3 mL  3 mL Intravenous Q12H Smith, Rondell A, MD   3 mL at 11/28/23 0958   traZODone  (DESYREL ) tablet 200 mg  200 mg Oral QHS Smith, Rondell A, MD   200 mg at 11/27/23 2131   umeclidinium bromide  (INCRUSE ELLIPTA ) 62.5 MCG/ACT 1 puff  1 puff Inhalation Daily Reome, Earle J, RPH       venlafaxine  XR (EFFEXOR -XR) 24 hr capsule 150 mg  150 mg Oral Q breakfast Smith, Rondell A, MD   150 mg at 11/28/23 2536     Discharge Medications: Please see discharge summary for a list of discharge medications.  Relevant Imaging Results:  Relevant Lab Results:   Additional Information [id number]  Adajah Cocking S Geraldene Eisel, LCSW

## 2023-11-29 ENCOUNTER — Inpatient Hospital Stay (HOSPITAL_COMMUNITY)

## 2023-11-29 DIAGNOSIS — I38 Endocarditis, valve unspecified: Secondary | ICD-10-CM | POA: Diagnosis not present

## 2023-11-29 DIAGNOSIS — U071 COVID-19: Secondary | ICD-10-CM | POA: Diagnosis not present

## 2023-11-29 LAB — CBC WITH DIFFERENTIAL/PLATELET
Abs Immature Granulocytes: 0.03 10*3/uL (ref 0.00–0.07)
Basophils Absolute: 0 10*3/uL (ref 0.0–0.1)
Basophils Relative: 0 %
Eosinophils Absolute: 0 10*3/uL (ref 0.0–0.5)
Eosinophils Relative: 0 %
HCT: 35.9 % — ABNORMAL LOW (ref 39.0–52.0)
Hemoglobin: 12 g/dL — ABNORMAL LOW (ref 13.0–17.0)
Immature Granulocytes: 1 %
Lymphocytes Relative: 17 %
Lymphs Abs: 0.8 10*3/uL (ref 0.7–4.0)
MCH: 29.9 pg (ref 26.0–34.0)
MCHC: 33.4 g/dL (ref 30.0–36.0)
MCV: 89.5 fL (ref 80.0–100.0)
Monocytes Absolute: 0.5 10*3/uL (ref 0.1–1.0)
Monocytes Relative: 11 %
Neutro Abs: 3.2 10*3/uL (ref 1.7–7.7)
Neutrophils Relative %: 71 %
Platelets: 185 10*3/uL (ref 150–400)
RBC: 4.01 MIL/uL — ABNORMAL LOW (ref 4.22–5.81)
RDW: 13.2 % (ref 11.5–15.5)
WBC: 4.6 10*3/uL (ref 4.0–10.5)
nRBC: 0 % (ref 0.0–0.2)

## 2023-11-29 LAB — ECHOCARDIOGRAM COMPLETE
AR max vel: 1.34 cm2
AV Area VTI: 1.58 cm2
AV Area mean vel: 1.39 cm2
AV Mean grad: 19 mmHg
AV Peak grad: 27.7 mmHg
Ao pk vel: 2.63 m/s
Height: 74 in
S' Lateral: 3.6 cm
Weight: 5675.52 [oz_av]

## 2023-11-29 LAB — BASIC METABOLIC PANEL WITH GFR
Anion gap: 8 (ref 5–15)
BUN: 15 mg/dL (ref 8–23)
CO2: 24 mmol/L (ref 22–32)
Calcium: 8.9 mg/dL (ref 8.9–10.3)
Chloride: 108 mmol/L (ref 98–111)
Creatinine, Ser: 0.84 mg/dL (ref 0.61–1.24)
GFR, Estimated: >60 mL/min (ref >60)
Glucose, Bld: 129 mg/dL — ABNORMAL HIGH (ref 70–99)
Potassium: 3.8 mmol/L (ref 3.5–5.1)
Sodium: 140 mmol/L (ref 135–145)

## 2023-11-29 LAB — GLUCOSE, CAPILLARY
Glucose-Capillary: 201 mg/dL — ABNORMAL HIGH (ref 70–99)
Glucose-Capillary: 176 mg/dL — ABNORMAL HIGH (ref 70–99)
Glucose-Capillary: 142 mg/dL — ABNORMAL HIGH (ref 70–99)
Glucose-Capillary: 207 mg/dL — ABNORMAL HIGH (ref 70–99)
Glucose-Capillary: 177 mg/dL — ABNORMAL HIGH (ref 70–99)

## 2023-11-29 LAB — BRAIN NATRIURETIC PEPTIDE: B Natriuretic Peptide: 149.1 pg/mL — ABNORMAL HIGH (ref 0.0–100.0)

## 2023-11-29 LAB — PROCALCITONIN: Procalcitonin: 0.17 ng/mL

## 2023-11-29 LAB — C-REACTIVE PROTEIN: CRP: 4.4 mg/dL — ABNORMAL HIGH (ref <1.0)

## 2023-11-29 LAB — MAGNESIUM: Magnesium: 2.2 mg/dL (ref 1.7–2.4)

## 2023-11-29 MED ORDER — METHYLPREDNISOLONE SODIUM SUCC 40 MG IJ SOLR
20.0000 mg | Freq: Once | INTRAMUSCULAR | Status: AC
  Administered 2023-11-29: 20 mg via INTRAVENOUS
  Filled 2023-11-29: qty 1

## 2023-11-29 NOTE — Progress Notes (Signed)
 PROGRESS NOTE                                                                                                                                                                                                             Patient Demographics:    Lawrence [name], is a 68 y.o. male, DOB - [birth year], ZOX:[id number]  Outpatient Primary MD for the patient is Darnelle Elders, PA-C    LOS - 1  Admit date - 11/27/2023    Chief Complaint  Patient presents with   Level 2 FOT   Code Sepsis       Brief Narrative (HPI from H&P)    69 y.o. male with medical history significant of COPD, severe aortic stenosis status post TAVR in July 2024, seizure disorder, chronic diastolic CHF (follows Dr. Mitzie Anda), atrial fibrillation on Eliquis , CVA, gout, OSA on nighttime CPAP, anxiety, BPH, chronic venous stasis, and morbid obesity presents after having an unwitnessed fall out of his wheelchair.   Records note patient had just recently been hospitalized 3/14-3/19 for sepsis with acute respiratory failure with hypoxia secondary to influenza A with pneumonia and COPD exacerbation.   The patient, who normally uses a wheelchair for mobility, experienced a fall after leaning forward attempting to pick something off the floor with his pick-up stick and hit his head.  Denied any loss of consciousness.  He was found to be febrile was COVID-19 positive, his only complaint was right foot pain, subsequently his blood cultures came back positive for gram-positive cocci 2 out of 2 when he was admitted.   Subjective:   Patient in bed, appears comfortable, denies any headache, no fever, no chest pain or pressure, no shortness of breath , no abdominal pain. No new focal weakness.  Right foot pain considerably improved.   Assessment  & Plan :   Sepsis, Incidental COVID-19 infection.  2 out of 2 blood culture positive for Enterococcus.  Source unclear, CT chest abdomen  pelvis nonacute, his only complaint is ongoing pain in his right foot which could be due to underlying gout, x-ray right foot, currently on IV daptomycin , ID following, appears nontoxic.  Continue supportive care with IV fluids and midodrine  for blood pressure, TTE and TEE pending.  Has underlying history of MR.  Monitor.   HX of gout, ongoing right foot pain for the last few  weeks, twitching in the right leg.  Has history of gout and seizures, stable EEG, stable uric acid level stable, hold allopurinol , continue colchicine , even a trial of IV steroid on 11/28/2023, repeat on 11/29/2023, right foot pain and twitching considerably improved.  X-ray of the right foot nonacute.  Incidental 19 infection.  Acute but appears to be incidental.  No cough or shortness of breath, inflammatory markers stable, not a candidate for Paxlovid due to Eliquis .  Continue to monitor.   Hypotension Acute.  Initial blood pressures noted to be as low as 77/38.  Suspect secondary to acute infection.  Blood pressures improved with IV fluids.  Continue gentle hydration, hold diuretics, add midodrine  and monitor.   Fall out of wheelchair - CT scan of the head and cervical spine did not note any acute abnormality and chronic changes as previously noted on previous MRI imaging from 3/3.  PT/OT to evaluate and treat.   COPD, without acute exacerbation at this time  Patient does not appear to have active wheezing on physical exam although decreased overall aeration. Albuterol  nebs as needed for shortness of breath/wheezing.   Paroxysmal atrial fibrillation, [place] vasc 2 score of greater than 3 on chronic anticoagulation  Patient appears to be in sinus rhythm at this time. Continue Eliquis .  Not on rate controlling agents.   Chr. Diastolic congestive heart failure - has mild fluid overload which is chronic, blood pressure too low holding diuretics.  Status post TAVR No acute issues noted per last echocardiogram from 06/2023.  TTE and  TEE requested due to bacteremia.   Seizure disorder  - Continue Keppra  and lacosamide , has been having twitching in his right lower extremity for a few days, will check EEG.   Hyperlipidemia  - Continue fenofibrate  and atorvastatin    Anxiety and depression - Continue trazodone , mirtazapine , buspirone , venlafaxine , and aripiprazole    Morbid obesity - OSA on CPAP  BMI 46.22 kg/m, follow-up with PCP weight loss, continue nighttime CPAP.   Diabetes mellitus type 2, with long-term use of insulin  -  Patient is on sliding scale insulin , Prandin , and Mounjaro .  Continue Prandin  & SSI  Lab Results  Component Value Date   HGBA1C 6.6 (H) 10/04/2023   CBG (last 3)  Recent Labs    11/28/23 1659 11/28/23 2046 11/29/23 0752  GLUCAP 231* 201* 176*          Condition - Extremely Guarded  Family Communication  :  called [name] [phone] 11/28/2023 at 10 AM message left  Code Status :  DNR  Consults  :  ID, PCCM  PUD Prophylaxis :     Procedures  :     TEE   TTE   EEG - non acute  CT head.  C-spine.    CT head: 1.  No evidence of an acute intracranial abnormality. 2. Forehead hematoma on the right. 3. Cerebral atrophy. 4. Paranasal sinus disease as described.   CT cervical spine: 1. No evidence of an acute cervical spine fracture. 2. Grade 1 anterolisthesis at C2-C3, C3-C4 and C4-C5. 3. Levocurvature of the cervical spine. 4. Cervical spondylosis as described within the body of the report. Multifactorial severe spinal canal stenosis at C3-C4 (as was demonstrated on the recent prior cervical spine MRI of 10/09/2023). Multilevel bony neural foraminal narrowing. 5. Erosions noted along the left C3-C4 facet joint, a finding which can be seen in setting of CPPD arthropathy. 6. 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  CT Chest, abdomen and pelvis-  1.  No acute findings. 2. Aortic Atherosclerosis (ICD10-I70.0) and Emphysema (ICD10-J43.9). Coronary and systemic atherosclerosis. 3. Sigmoid  colon diverticulosis. 4. Gluteal and upper thigh muscular atrophy with the exception of the hip adductor musculature. There is some paraspinal muscular atrophy and atrophy of the latissimus dorsi muscles. 5. Dorsal stimulator device noted electrodes at the T12-L1 level.      Disposition Plan  :    Status is: Inpatient   DVT Prophylaxis  :    Place TED hose Start: 11/28/23 1000 apixaban  (ELIQUIS ) tablet 5 mg     Lab Results  Component Value Date   PLT 185 11/29/2023    Diet :  Diet Order             Diet heart healthy/carb modified Room service appropriate? Yes; Fluid consistency: Thin  Diet effective now                    Inpatient Medications  Scheduled Meds:  apixaban   5 mg Oral BID   ARIPiprazole   5 mg Oral QHS   busPIRone   10 mg Oral TID   colchicine   0.6 mg Oral BID   cyanocobalamin   1,000 mcg Oral Daily   fenofibrate   160 mg Oral Daily   fluticasone   2 spray Each Nare Daily   guaiFENesin   600 mg Oral BID   insulin  aspart  0-5 Units Subcutaneous QHS   insulin  aspart  0-9 Units Subcutaneous TID WC   lacosamide   150 mg Oral BID   levETIRAcetam   500 mg Oral BID   methylPREDNISolone  (SOLU-MEDROL ) injection  20 mg Intravenous Once   midodrine   10 mg Oral TID WC   mirtazapine   15 mg Oral QHS   prazosin   2 mg Oral QHS   pregabalin   150 mg Oral TID   repaglinide   2 mg Oral BID   rOPINIRole   0.25 mg Oral BID   sodium chloride  flush  3 mL Intravenous Q12H   traZODone   200 mg Oral QHS   umeclidinium bromide   1 puff Inhalation Daily   venlafaxine  XR  150 mg Oral Q breakfast   Continuous Infusions:  ampicillin  (OMNIPEN) IV 2 g (11/29/23 0801)   DAPTOmycin      PRN Meds:.acetaminophen  **OR** acetaminophen , albuterol , bisacodyl , HYDROcodone -acetaminophen , Muscle Rub, ondansetron  **OR** ondansetron  (ZOFRAN ) IV  Antibiotics  :    Anti-infectives (From admission, onward)    Start     Dose/Rate Route Frequency Ordered Stop   11/29/23 1400  DAPTOmycin  (CUBICIN )  1,100 mg in sodium chloride  0.9 % IVPB        10 mg/kg  113.2 kg (Adjusted) 144 mL/hr over 30 Minutes Intravenous Daily 11/28/23 1408     11/28/23 1600  ampicillin  (OMNIPEN) 2 g in sodium chloride  0.9 % 100 mL IVPB        2 g 300 mL/hr over 20 Minutes Intravenous Every 4 hours 11/28/23 1453     11/28/23 0800  DAPTOmycin  (CUBICIN ) 1,200 mg in sodium chloride  0.9 % IVPB  Status:  Discontinued        1,200 mg 148 mL/hr over 30 Minutes Intravenous Daily 11/28/23 0545 11/28/23 1408   11/27/23 1015  piperacillin -tazobactam (ZOSYN ) IVPB 3.375 g        3.375 g 100 mL/hr over 30 Minutes Intravenous  Once 11/27/23 1007 11/27/23 1150   11/27/23 1015  vancomycin  (VANCOCIN ) IVPB 1000 mg/200 mL premix  Status:  Discontinued        1,000 mg 200 mL/hr over 60 Minutes Intravenous  Once  11/27/23 1007 11/27/23 1011   11/27/23 1015  vancomycin  (VANCOREADY) IVPB 2000 mg/400 mL        2,000 mg 100 mL/hr over 240 Minutes Intravenous  Once 11/27/23 1012 11/27/23 1558         Objective:   Vitals:   11/29/23 0400 11/29/23 0500 11/29/23 0754 11/29/23 0800  BP:   (!) 108/59 111/72  Pulse:   74 72  Resp:   17 16  Temp: 98.6 F (37 C)     TempSrc: Oral     SpO2:   92% 94%  Weight:  (!) 160.9 kg    Height:        Wt Readings from Last 3 Encounters:  11/29/23 (!) 160.9 kg  10/25/23 (!) 150 kg  10/10/23 (!) 157 kg     Intake/Output Summary (Last 24 hours) at 11/29/2023 0909 Last data filed at 11/29/2023 0753 Gross per 24 hour  Intake 540 ml  Output 3400 ml  Net -2860 ml     Physical Exam  Awake Alert, No new F.N deficits, Normal affect .AT,PERRAL Supple Neck, No JVD,   Symmetrical Chest wall movement, Good air movement bilaterally, CTAB RRR,No Gallops,Rubs or new Murmurs,  +ve B.Sounds, Abd Soft, No tenderness,   Trace edema, mild R foot tenderness, bilateral first MTP bunions    Data Review:    Recent Labs  Lab 11/27/23 0933 11/27/23 1054 11/28/23 0435 11/29/23 0516  WBC 8.1   --  6.7 4.6  HGB 13.6 13.6 12.3* 12.0*  HCT 40.5 40.0 37.2* 35.9*  PLT 184  --  153 185  MCV 91.4  --  91.0 89.5  MCH 30.7  --  30.1 29.9  MCHC 33.6  --  33.1 33.4  RDW 13.8  --  14.0 13.2  LYMPHSABS 0.6*  --   --  0.8  MONOABS 0.6  --   --  0.5  EOSABS 0.1  --   --  0.0  BASOSABS 0.0  --   --  0.0    Recent Labs  Lab 11/27/23 0933 11/27/23 0959 11/27/23 1054 11/27/23 1300 11/27/23 2109 11/28/23 0435 11/29/23 0516  NA 137  --  139  --   --  137 140  K 3.7  --  3.6  --   --  3.2* 3.8  CL 101  --  99  --   --  104 108  CO2 26  --   --   --   --  26 24  ANIONGAP 10  --   --   --   --  7 8  GLUCOSE 196*  --  192*  --   --  97 129*  BUN 14  --  16  --   --  13 15  CREATININE 1.12  --  1.10  --   --  1.05 0.84  AST 25  --   --   --   --   --   --   ALT 25  --   --   --   --   --   --   ALKPHOS 43  --   --   --   --   --   --   BILITOT 0.9  --   --   --   --   --   --   ALBUMIN 3.3*  --   --   --   --   --   --   CRP  --   --   --   --   --  4.1* 4.4*  PROCALCITON  --   --   --   --  0.39 0.35 0.17  LATICACIDVEN  --  1.8  --  0.8  --   --   --   INR 1.1  --   --   --   --   --   --   BNP  --   --   --   --  74.8 94.5 149.1*  MG  --   --   --   --   --   --  2.2  CALCIUM  8.7*  --   --   --   --  8.4* 8.9      Recent Labs  Lab 11/27/23 0933 11/27/23 0959 11/27/23 1300 11/27/23 2109 11/28/23 0435 11/29/23 0516  CRP  --   --   --   --  4.1* 4.4*  PROCALCITON  --   --   --  0.39 0.35 0.17  LATICACIDVEN  --  1.8 0.8  --   --   --   INR 1.1  --   --   --   --   --   BNP  --   --   --  74.8 94.5 149.1*  MG  --   --   --   --   --  2.2  CALCIUM  8.7*  --   --   --  8.4* 8.9    --------------------------------------------------------------------------------------------------------------- Lab Results  Component Value Date   CHOL 151 06/25/2023   HDL 30 (L) 06/25/2023   LDLCALC 79 06/25/2023   TRIG 209 (H) 06/25/2023   CHOLHDL 5.0 06/25/2023    Lab Results   Component Value Date   HGBA1C 6.6 (H) 10/04/2023   No results for input(s): "TSH", "T4TOTAL", "FREET4", "T3FREE", "THYROIDAB" in the last 72 hours. No results for input(s): "VITAMINB12", "FOLATE", "FERRITIN", "TIBC", "IRON", "RETICCTPCT" in the last 72 hours. ------------------------------------------------------------------------------------------------------------------ Cardiac Enzymes No results for input(s): "CKMB", "TROPONINI", "MYOGLOBIN" in the last 168 hours.  Invalid input(s): "CK"  Micro Results Recent Results (from the past 240 hours)  Resp panel by RT-PCR (RSV, Flu A&B, Covid) Anterior Nasal Swab     Status: Abnormal   Collection Time: 11/27/23  9:33 AM   Specimen: Anterior Nasal Swab  Result Value Ref Range Status   SARS Coronavirus 2 by RT PCR POSITIVE (A) NEGATIVE Final   Influenza A by PCR NEGATIVE NEGATIVE Final   Influenza B by PCR NEGATIVE NEGATIVE Final    Comment: (NOTE) The Xpert Xpress SARS-CoV-2/FLU/RSV plus assay is intended as an aid in the diagnosis of influenza from Nasopharyngeal swab specimens and should not be used as a sole basis for treatment. Nasal washings and aspirates are unacceptable for Xpert Xpress SARS-CoV-2/FLU/RSV testing.  Fact Sheet for Patients: BloggerCourse.com  Fact Sheet for Healthcare Providers: SeriousBroker.it  This test is not yet approved or cleared by the [place]  FDA and has been authorized for detection and/or diagnosis of SARS-CoV-2 by FDA under an Emergency Use Authorization (EUA). This EUA will remain in effect (meaning this test can be used) for the duration of the COVID-19 declaration under Section 564(b)(1) of the Act, 21 U.S.C. section 360bbb-3(b)(1), unless the authorization is terminated or revoked.     Resp Syncytial Virus by PCR NEGATIVE NEGATIVE Final    Comment: (NOTE) Fact Sheet for Patients: BloggerCourse.com  Fact  Sheet for Healthcare Providers: SeriousBroker.it  This test is not yet approved or cleared by the [place]  FDA and has  been authorized for detection and/or diagnosis of SARS-CoV-2 by FDA under an Emergency Use Authorization (EUA). This EUA will remain in effect (meaning this test can be used) for the duration of the COVID-19 declaration under Section 564(b)(1) of the Act, 21 U.S.C. section 360bbb-3(b)(1), unless the authorization is terminated or revoked.  Performed at [facility], [address] [place], [place] [address]   Blood Culture (routine x 2)     Status: None (Preliminary result)   Collection Time: 11/27/23  9:33 AM   Specimen: BLOOD  Result Value Ref Range Status   Specimen Description BLOOD LEFT ANTECUBITAL  Final   Special Requests   Final    BOTTLES DRAWN AEROBIC AND ANAEROBIC Blood Culture results may not be optimal due to an inadequate volume of blood received in culture bottles   Culture  Setup Time   Final    GRAM POSITIVE COCCI IN BOTH AEROBIC AND ANAEROBIC BOTTLES CRITICAL RESULT CALLED TO, READ BACK BY AND VERIFIED WITH: Alycia Babcock A [id number] FCP Performed at [facility], [address] [place], [place] [address]    Culture GRAM POSITIVE COCCI  Final   Report Status PENDING  Incomplete  Blood Culture ID Panel (Reflexed)     Status: Abnormal   Collection Time: 11/27/23  9:33 AM  Result Value Ref Range Status   Enterococcus faecalis NOT DETECTED NOT DETECTED Final   Enterococcus Faecium DETECTED (A) NOT DETECTED Final    Comment: CRITICAL RESULT CALLED TO, READ BACK BY AND VERIFIED WITH: PHARMD GREG A [phone] [id number] FCP      Listeria monocytogenes NOT DETECTED NOT DETECTED Final   Staphylococcus species NOT DETECTED NOT DETECTED Final   Staphylococcus aureus (BCID) NOT DETECTED NOT DETECTED Final   Staphylococcus epidermidis NOT DETECTED NOT DETECTED Final   Staphylococcus lugdunensis NOT DETECTED NOT DETECTED  Final   Streptococcus species NOT DETECTED NOT DETECTED Final   Streptococcus agalactiae NOT DETECTED NOT DETECTED Final   Streptococcus pneumoniae NOT DETECTED NOT DETECTED Final   Streptococcus pyogenes NOT DETECTED NOT DETECTED Final   A.calcoaceticus-baumannii NOT DETECTED NOT DETECTED Final   Bacteroides fragilis NOT DETECTED NOT DETECTED Final   Enterobacterales NOT DETECTED NOT DETECTED Final   Enterobacter cloacae complex NOT DETECTED NOT DETECTED Final   Escherichia coli NOT DETECTED NOT DETECTED Final   Klebsiella aerogenes NOT DETECTED NOT DETECTED Final   Klebsiella oxytoca NOT DETECTED NOT DETECTED Final   Klebsiella pneumoniae NOT DETECTED NOT DETECTED Final   Proteus species NOT DETECTED NOT DETECTED Final   Salmonella species NOT DETECTED NOT DETECTED Final   Serratia marcescens NOT DETECTED NOT DETECTED Final   Haemophilus influenzae NOT DETECTED NOT DETECTED Final   Neisseria meningitidis NOT DETECTED NOT DETECTED Final   Pseudomonas aeruginosa NOT DETECTED NOT DETECTED Final   Stenotrophomonas maltophilia NOT DETECTED NOT DETECTED Final   Candida albicans NOT DETECTED NOT DETECTED Final   Candida auris NOT DETECTED NOT DETECTED Final   Candida glabrata NOT DETECTED NOT DETECTED Final   Candida krusei NOT DETECTED NOT DETECTED Final   Candida parapsilosis NOT DETECTED NOT DETECTED Final   Candida tropicalis NOT DETECTED NOT DETECTED Final   Cryptococcus neoformans/gattii NOT DETECTED NOT DETECTED Final   Vancomycin  resistance DETECTED (A) NOT DETECTED Final    Comment: CRITICAL RESULT CALLED TO, READ BACK BY AND VERIFIED WITH: Alycia Babcock A [id number] FCP   Performed at [facility], [address] [place], [place] [address]  Blood Culture (routine x 2)     Status: None (Preliminary result)   Collection Time: 11/27/23 10:23 AM   Specimen: BLOOD  Result Value Ref Range Status   Specimen Description BLOOD RIGHT ANTECUBITAL  Final   Special Requests    Final    BOTTLES DRAWN AEROBIC AND ANAEROBIC Blood Culture results may not be optimal due to an inadequate volume of blood received in culture bottles   Culture  Setup Time   Final    GRAM POSITIVE COCCI IN BOTH AEROBIC AND ANAEROBIC BOTTLES CRITICAL VALUE NOTED.  VALUE IS CONSISTENT WITH PREVIOUSLY REPORTED AND CALLED VALUE. Performed at [facility], [address], [place], [place] [address]    Culture GRAM POSITIVE COCCI  Final   Report Status PENDING  Incomplete  MRSA Next Gen by PCR, Nasal     Status: None   Collection Time: 11/28/23  5:52 AM   Specimen: Nasal Mucosa; Nasal Swab  Result Value Ref Range Status   MRSA by PCR Next Gen NOT DETECTED NOT DETECTED Final    Comment: (NOTE) The GeneXpert MRSA Assay (FDA approved for NASAL specimens only), is one component of a comprehensive MRSA colonization surveillance program. It is not intended to diagnose MRSA infection nor to guide or monitor treatment for MRSA infections. Test performance is not FDA approved in patients less than 31 years old. Performed at [facility], [address], [place], [place] [address]     Radiology Report EEG adult Result Date: 11/28/2023 Arleene Lack, MD     11/28/2023 12:53 PM Patient Name: LAURIE LOVEJOY MRN: [id number] Epilepsy Attending: Arleene Lack Referring Physician/Provider: Cala Castleman, MD Date: 11/28/2023 Duration: 22.33 mins Patient history: 68yo M has been having twitching in his right lower extremity for a few days. EEG to evaluate for seizure Level of alertness: Awake, asleep AEDs during EEG study: LEV, LCM Technical aspects: This EEG study was done with scalp electrodes positioned according to the 10-20 International system of electrode placement. Electrical activity was reviewed with band pass filter of 1-70Hz , sensitivity of 7 uV/mm, display speed of 12mm/sec with a 60Hz  notched filter applied as appropriate. EEG data were recorded continuously and digitally  stored.  Video monitoring was available and reviewed as appropriate. Description: The posterior dominant rhythm consists of 7.5 Hz activity of moderate voltage (25-35 uV) seen predominantly in posterior head regions, symmetric and reactive to eye opening and eye closing. Sleep was characterized by vertex waves, sleep spindles (12 to 14 Hz), maximal frontocentral region. Physiologic photic driving was not seen during photic stimulation.  Hyperventilation was not performed.   IMPRESSION: This study is within normal limits. No seizures or definite epileptiform discharges were seen throughout the recording.  A normal interictal EEG does not exclude the diagnosis of epilepsy. Priyanka Suzanne Erps   DG Foot Complete Right Result Date: 11/28/2023 CLINICAL DATA:  Right foot pain EXAM: RIGHT FOOT COMPLETE - 3+ VIEW COMPARISON:  None Available. 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 IMPRESSION: Osteoarthrosis of the first metatarsophalangeal joint with hallux valgus and bunion deformity. Electronically Signed   By: Fredrich Jefferson M.D.   On: 11/28/2023 12:02   DG Chest Port 1 View Result Date: 11/28/2023 CLINICAL DATA:  Shortness of breath EXAM: PORTABLE CHEST 1 VIEW COMPARISON:  11/27/2023 FINDINGS: Cardiomegaly with vascular congestion. Diffuse interstitial prominence, favor interstitial edema. Probable layering effusions with bibasilar  opacities, likely atelectasis. IMPRESSION: Cardiomegaly with vascular congestion and interstitial prominence, likely interstitial edema. Layering bilateral effusions with bibasilar atelectasis. Electronically Signed   By: Janeece Mechanic M.D.   On: 11/28/2023 10:01   CT CHEST ABDOMEN PELVIS W CONTRAST Result Date: 11/27/2023 CLINICAL DATA:  Poly trauma, fall from wheelchair. Anti coagulation. EXAM: CT CHEST, ABDOMEN, AND PELVIS WITH CONTRAST TECHNIQUE:  Multidetector CT imaging of the chest, abdomen and pelvis was performed following the standard protocol during bolus administration of intravenous contrast. Due to patient body habitus, part of the abdomen was not included initial imaging, the patient was readministered contrast and reimaged with a larger field of view. RADIATION DOSE REDUCTION: This exam was performed according to the departmental dose-optimization program which includes automated exposure control, adjustment of the mA and/or kV according to patient size and/or use of iterative reconstruction technique. CONTRAST:  OMNIPAQUE  IOHEXOL  350 MG/ML SOLN COMPARISON:  10/02/2023 FINDINGS: CT CHEST FINDINGS Cardiovascular: Coronary, aortic arch, and branch vessel atherosclerotic vascular disease. TAVR noted. There is some linear high density in a left lower lobe pulmonary arterial branch on image 5 series 9, likely incidental embolized methacrylate. Mediastinum/Nodes: Unremarkable Lungs/Pleura: Emphysema. Mild lower lobe dependent atelectasis bilaterally. Musculoskeletal: Thoracic spondylosis. CT ABDOMEN PELVIS FINDINGS Hepatobiliary: Simple right hepatic lobe cyst on image 33 series 9 warrants no further imaging workup. Gallbladder unremarkable. Pancreas: Unremarkable Spleen: Unremarkable Adrenals/Urinary Tract: Benign right renal cysts warrant no further imaging workup. Adrenal glands appear normal. Urinary bladder unremarkable. Stomach/Bowel: Sigmoid colon diverticulosis. Vascular/Lymphatic: Atherosclerosis is present, including aortoiliac atherosclerotic disease. Atheromatous plaque proximally in the SMA without overt occlusion. Reproductive: Unremarkable Other: No supplemental non-categorized findings. Musculoskeletal: Dorsal stimulator device noted electrodes at the T12-L1 level. Gluteal and upper thigh muscular atrophy with the exception of the hip adductor musculature. There is some paraspinal muscular atrophy and atrophy of the latissimus  dorsi muscles. IMPRESSION: 1. No acute findings. 2. Aortic Atherosclerosis (ICD10-I70.0) and Emphysema (ICD10-J43.9). Coronary and systemic atherosclerosis. 3. Sigmoid colon diverticulosis. 4. Gluteal and upper thigh muscular atrophy with the exception of the hip adductor musculature. There is some paraspinal muscular atrophy and atrophy of the latissimus dorsi muscles. 5. Dorsal stimulator device noted electrodes at the T12-L1 level. Electronically Signed   By: Freida Jes M.D.   On: 11/27/2023 16:05   CT Head Wo Contrast Result Date: 11/27/2023 CLINICAL DATA:  Provided history: Head trauma, GCS = 15, no focal neuro findings. Neck trauma. Additional history provided: Fall on blood thinners. EXAM: CT HEAD WITHOUT CONTRAST CT CERVICAL SPINE WITHOUT CONTRAST TECHNIQUE: Multidetector CT imaging of the head and cervical spine was performed following the standard protocol without intravenous contrast. Multiplanar CT image reconstructions of the cervical spine were also generated. RADIATION DOSE REDUCTION: This exam was performed according to the departmental dose-optimization program which includes automated exposure control, adjustment of the mA and/or kV according to patient size and/or use of iterative reconstruction technique. COMPARISON:  Prior head CT examinations 10/10/2023 and earlier. Cervical spine MRI 10/09/2023. FINDINGS: CT HEAD FINDINGS Brain: Generalized cerebral atrophy. There is no acute intracranial hemorrhage. No demarcated cortical infarct. No extra-axial fluid collection. No evidence of an intracranial mass. No midline shift. Vascular: No hyperdense vessel.  Atherosclerotic calcifications. Skull: No calvarial fracture or aggressive osseous lesion. Sinuses/Orbits: No mass or acute finding within the imaged orbits. Minimal mucosal thickening within the bilateral maxillary sinuses. Trace mucosal thickening within the right sphenoid sinus. Mild-to-moderate mucosal thickening within the left  sphenoid sinus. Mild-to-moderate bilateral ethmoid sinusitis. Mild-to-moderate mucosal thickening within  the left frontal sinus. Other: Forehead hematoma on the right. CT CERVICAL SPINE FINDINGS Alignment: Levocurvature of the cervical spine. 3 mm grade 1 anterolisthesis at C2-C3, C3-C4 and C4-C5. Skull base and vertebrae: The basion-dental and atlanto-dental intervals are maintained.No evidence of acute fracture to the cervical spine. 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 Soft tissues and spinal canal: No prevertebral fluid or swelling. No visible canal hematoma. Disc levels: Cervical spondylosis with multilevel disc space narrowing, disc bulges/central disc protrusions, posterior disc osteophyte complexes, uncovertebral hypertrophy and facet arthropathy. Erosions are noted along the left C3-C4 facet joint. Disc degeneration is greatest at C5-C6, C6-C7 and C7-T1 (advanced at these levels). Multilevel spinal canal narrowing. Most notably at C3-C4, a disc bulge contributes to multifactorial severe spinal canal stenosis (as was demonstrated on the recent prior cervical spine MRI of 10/09/2023). Multilevel bony foraminal stenosis. Degenerative changes also present at the C1-C2 articulation. Upper chest: No consolidation within the imaged lung apices. No visible pneumothorax. IMPRESSION: CT head: 1.  No evidence of an acute intracranial abnormality. 2. Forehead hematoma on the right. 3. Cerebral atrophy. 4. Paranasal sinus disease as described. CT cervical spine: 1. No evidence of an acute cervical spine fracture. 2. Grade 1 anterolisthesis at C2-C3, C3-C4 and C4-C5. 3. Levocurvature of the cervical spine. 4. Cervical spondylosis as described within the body of the report. Multifactorial severe spinal canal stenosis at C3-C4 (as was demonstrated on the recent prior cervical spine MRI of 10/09/2023). Multilevel bony neural foraminal narrowing. 5. Erosions noted along the left C3-C4 facet joint, a finding which can be  seen in setting of CPPD arthropathy. 6. 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 Electronically Signed   By: Bascom Lily D.O.   On: 11/27/2023 13:27   CT Cervical Spine Wo Contrast Result Date: 11/27/2023 CLINICAL DATA:  Provided history: Head trauma, GCS = 15, no focal neuro findings. Neck trauma. Additional history provided: Fall on blood thinners. EXAM: CT HEAD WITHOUT CONTRAST CT CERVICAL SPINE WITHOUT CONTRAST TECHNIQUE: Multidetector CT imaging of the head and cervical spine was performed following the standard protocol without intravenous contrast. Multiplanar CT image reconstructions of the cervical spine were also generated. RADIATION DOSE REDUCTION: This exam was performed according to the departmental dose-optimization program which includes automated exposure control, adjustment of the mA and/or kV according to patient size and/or use of iterative reconstruction technique. COMPARISON:  Prior head CT examinations 10/10/2023 and earlier. Cervical spine MRI 10/09/2023. FINDINGS: CT HEAD FINDINGS Brain: Generalized cerebral atrophy. There is no acute intracranial hemorrhage. No demarcated cortical infarct. No extra-axial fluid collection. No evidence of an intracranial mass. No midline shift. Vascular: No hyperdense vessel.  Atherosclerotic calcifications. Skull: No calvarial fracture or aggressive osseous lesion. Sinuses/Orbits: No mass or acute finding within the imaged orbits. Minimal mucosal thickening within the bilateral maxillary sinuses. Trace mucosal thickening within the right sphenoid sinus. Mild-to-moderate mucosal thickening within the left sphenoid sinus. Mild-to-moderate bilateral ethmoid sinusitis. Mild-to-moderate mucosal thickening within the left frontal sinus. Other: Forehead hematoma on the right. CT CERVICAL SPINE FINDINGS Alignment: Levocurvature of the cervical spine. 3 mm grade 1 anterolisthesis at C2-C3, C3-C4 and C4-C5. Skull base and vertebrae: The basion-dental and  atlanto-dental intervals are maintained.No evidence of acute fracture to the cervical spine. 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 Soft tissues and spinal canal: No prevertebral fluid or swelling. No visible canal hematoma. Disc levels: Cervical spondylosis with multilevel disc space narrowing, disc bulges/central disc protrusions, posterior disc osteophyte complexes, uncovertebral hypertrophy and facet  arthropathy. Erosions are noted along the left C3-C4 facet joint. Disc degeneration is greatest at C5-C6, C6-C7 and C7-T1 (advanced at these levels). Multilevel spinal canal narrowing. Most notably at C3-C4, a disc bulge contributes to multifactorial severe spinal canal stenosis (as was demonstrated on the recent prior cervical spine MRI of 10/09/2023). Multilevel bony foraminal stenosis. Degenerative changes also present at the C1-C2 articulation. Upper chest: No consolidation within the imaged lung apices. No visible pneumothorax. IMPRESSION: CT head: 1.  No evidence of an acute intracranial abnormality. 2. Forehead hematoma on the right. 3. Cerebral atrophy. 4. Paranasal sinus disease as described. CT cervical spine: 1. No evidence of an acute cervical spine fracture. 2. Grade 1 anterolisthesis at C2-C3, C3-C4 and C4-C5. 3. Levocurvature of the cervical spine. 4. Cervical spondylosis as described within the body of the report. Multifactorial severe spinal canal stenosis at C3-C4 (as was demonstrated on the recent prior cervical spine MRI of 10/09/2023). Multilevel bony neural foraminal narrowing. 5. Erosions noted along the left C3-C4 facet joint, a finding which can be seen in setting of CPPD arthropathy. 6. 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 Electronically Signed   By: Bascom Lily D.O.   On: 11/27/2023 13:27   DG Chest Port 1 View Result Date: 11/27/2023 CLINICAL DATA:  Sepsis EXAM: PORTABLE CHEST 1 VIEW COMPARISON:  10/20/2023. FINDINGS: Enlarged cardiopericardial silhouette. Tortuous ectatic aorta. No  consolidation, pneumothorax or effusion. No edema. Prominent central vasculature. Film is under penetrated. Overlapping cardiac leads. Metallic focus along the left side of the heart. Status post TAVR. IMPRESSION: Underinflation with enlarged cardiopericardial silhouette. Prior TAVR. Electronically Signed   By: Adrianna Horde M.D.   On: 11/27/2023 11:30     Signature  -   Lynnwood Sauer M.D on 11/29/2023 at 9:09 AM   -  To page go to [email or web address]

## 2023-11-29 NOTE — Plan of Care (Signed)
   Problem: Education: Goal: Knowledge of risk factors and measures for prevention of condition will improve Outcome: Progressing   Problem: Coping: Goal: Psychosocial and spiritual needs will be supported Outcome: Progressing   Problem: Respiratory: Goal: Will maintain a patent airway Outcome: Progressing

## 2023-11-29 NOTE — Progress Notes (Signed)
   11/29/23 2039  BiPAP/CPAP/SIPAP  Reason BIPAP/CPAP not in use Non-compliant  BiPAP/CPAP /SiPAP Vitals  Pulse Rate 70  Resp 18  SpO2 94 %

## 2023-11-29 NOTE — Plan of Care (Signed)
  Problem: Nutritional: Goal: Maintenance of adequate nutrition will improve Outcome: Progressing   Problem: Nutrition: Goal: Adequate nutrition will be maintained Outcome: Progressing   Problem: Elimination: Goal: Will not experience complications related to bowel motility Outcome: Progressing

## 2023-11-30 DIAGNOSIS — U071 COVID-19: Secondary | ICD-10-CM | POA: Diagnosis not present

## 2023-11-30 LAB — CBC WITH DIFFERENTIAL/PLATELET
Abs Immature Granulocytes: 0.03 10*3/uL (ref 0.00–0.07)
Basophils Absolute: 0 10*3/uL (ref 0.0–0.1)
Basophils Relative: 0 %
Eosinophils Absolute: 0.1 10*3/uL (ref 0.0–0.5)
Eosinophils Relative: 1 %
HCT: 37.1 % — ABNORMAL LOW (ref 39.0–52.0)
Hemoglobin: 12.2 g/dL — ABNORMAL LOW (ref 13.0–17.0)
Immature Granulocytes: 1 %
Lymphocytes Relative: 30 %
Lymphs Abs: 1.5 10*3/uL (ref 0.7–4.0)
MCH: 29.8 pg (ref 26.0–34.0)
MCHC: 32.9 g/dL (ref 30.0–36.0)
MCV: 90.7 fL (ref 80.0–100.0)
Monocytes Absolute: 0.6 10*3/uL (ref 0.1–1.0)
Monocytes Relative: 12 %
Neutro Abs: 2.7 10*3/uL (ref 1.7–7.7)
Neutrophils Relative %: 56 %
Platelets: 182 10*3/uL (ref 150–400)
RBC: 4.09 MIL/uL — ABNORMAL LOW (ref 4.22–5.81)
RDW: 13.2 % (ref 11.5–15.5)
WBC: 4.9 10*3/uL (ref 4.0–10.5)
nRBC: 0 % (ref 0.0–0.2)

## 2023-11-30 LAB — CULTURE, BLOOD (ROUTINE X 2)

## 2023-11-30 LAB — BASIC METABOLIC PANEL WITH GFR
Anion gap: 9 (ref 5–15)
BUN: 18 mg/dL (ref 8–23)
CO2: 26 mmol/L (ref 22–32)
Calcium: 8.9 mg/dL (ref 8.9–10.3)
Chloride: 106 mmol/L (ref 98–111)
Creatinine, Ser: 1.03 mg/dL (ref 0.61–1.24)
GFR, Estimated: >60 mL/min (ref >60)
Glucose, Bld: 100 mg/dL — ABNORMAL HIGH (ref 70–99)
Potassium: 3.7 mmol/L (ref 3.5–5.1)
Sodium: 141 mmol/L (ref 135–145)

## 2023-11-30 LAB — BRAIN NATRIURETIC PEPTIDE: B Natriuretic Peptide: 144.4 pg/mL — ABNORMAL HIGH (ref 0.0–100.0)

## 2023-11-30 LAB — GLUCOSE, CAPILLARY
Glucose-Capillary: 135 mg/dL — ABNORMAL HIGH (ref 70–99)
Glucose-Capillary: 101 mg/dL — ABNORMAL HIGH (ref 70–99)
Glucose-Capillary: 118 mg/dL — ABNORMAL HIGH (ref 70–99)
Glucose-Capillary: 116 mg/dL — ABNORMAL HIGH (ref 70–99)

## 2023-11-30 LAB — PROCALCITONIN: Procalcitonin: <0.1 ng/mL

## 2023-11-30 LAB — C-REACTIVE PROTEIN: CRP: 1.1 mg/dL — ABNORMAL HIGH (ref <1.0)

## 2023-11-30 LAB — MAGNESIUM: Magnesium: 2 mg/dL (ref 1.7–2.4)

## 2023-11-30 MED ORDER — SPIRONOLACTONE 25 MG PO TABS
25.0000 mg | ORAL_TABLET | Freq: Every day | ORAL | Status: DC
  Administered 2023-11-30: 25 mg via ORAL
  Filled 2023-11-30: qty 1

## 2023-11-30 MED ORDER — TORSEMIDE 20 MG PO TABS
20.0000 mg | ORAL_TABLET | Freq: Every day | ORAL | Status: DC
  Administered 2023-11-30 – 2023-12-08 (×8): 20 mg via ORAL
  Filled 2023-11-30 (×8): qty 1

## 2023-11-30 NOTE — Plan of Care (Signed)

## 2023-11-30 NOTE — Discharge Instructions (Signed)

## 2023-11-30 NOTE — TOC Progression Note (Signed)
 Transition of Care [facility]) - Progression Note    Patient Details  Name: Lawrence [name] MRN: [id number] Date of Birth: Sep 21, 1955  [facility]) CM/SW Contact  Jannice Mends, LCSW Phone Number: 11/30/2023, 4:56 PM  Clinical Narrative:    CSW started insurance process for [name], Ref# [id number]. A denial will not hinder patient's return there.    Expected Discharge Plan: Skilled Nursing Facility Barriers to Discharge: Continued Medical Work up  Expected Discharge Plan and Services In-house Referral: Clinical Social Work   Post Acute Care Choice: Skilled Nursing Facility Living arrangements for the past 2 months: Skilled Nursing Facility                                       Social Determinants of Health (SDOH) Interventions SDOH Screenings   Food Insecurity: No Food Insecurity (11/29/2023)  Housing: Low Risk  (11/29/2023)  Transportation Needs: No Transportation Needs (11/29/2023)  Recent Concern: Transportation Needs - Unmet Transportation Needs (10/03/2023)  Utilities: Not At Risk (11/29/2023)  Alcohol  Screen: Low Risk  (06/08/2021)  Financial Resource Strain: Medium Risk (12/06/2021)   Received from [facility], [facility]  Physical Activity: Unknown (12/06/2021)   Received from [facility], [facility]  Social Connections: Socially Isolated (11/29/2023)  Stress: No Stress Concern Present (01/21/2022)   Received from [facility], [facility]  Recent Concern: Stress - Stress Concern Present (11/19/2021)   Received from [facility]  Tobacco Use: Medium Risk (10/20/2023)    Readmission Risk Interventions    11/28/2023    2:45 PM 07/24/2023   11:56 AM 05/08/2023    4:37 PM  Readmission Risk Prevention Plan  Transportation Screening Complete Complete Complete  Medication Review Oceanographer) Complete Complete Complete  PCP or Specialist appointment within 3-5 days of discharge Complete Complete Complete  HRI or Home Care Consult Complete  Complete Complete  SW Recovery Care/Counseling Consult Complete Complete Complete  Palliative Care Screening Not Applicable Not Applicable Complete  Skilled Nursing Facility Complete Complete Not Applicable

## 2023-11-30 NOTE — Plan of Care (Signed)
   Problem: Education: Goal: Knowledge of risk factors and measures for prevention of condition will improve Outcome: Progressing   Problem: Coping: Goal: Psychosocial and spiritual needs will be supported Outcome: Progressing   Problem: Respiratory: Goal: Will maintain a patent airway Outcome: Progressing

## 2023-11-30 NOTE — Progress Notes (Signed)
 ID PROGRESS NOTE  67yo M with hx of TAVR now with VRE bacteremia concern for endocarditis. Continue on daptomycin  plus ampicillin . Repeat blood cx NGTD at 48hr. Also has mild case of covid-19  Amp R VRE. On micro   A/P VRE bacteremia = continue with daptomycin  plus ampicillin . For the timebeing until can rule out endocarditis. Can place picc line if needed. Will need at least 2 wk of abtx since 4/23.  Cardiology would like to do TEE on 4/30 at end of 10 days of isolation. We will test for covid again on Sunday and see if CT value in the range of non-infectious  Covid 19 = continue with supportive care. Continue on airborne contact isolation  Adah Acron B. Levern Reader MD MPH [facility] [phone]

## 2023-11-30 NOTE — Progress Notes (Signed)
 PROGRESS NOTE                                                                                                                                                                                                             Patient Demographics:    Lawrence [name], is a 68 y.o. male, DOB - November 09, 1955, ZOX:[id number]  Outpatient Primary MD for the patient is Darnelle Elders, PA-C    LOS - 2  Admit date - 11/27/2023    Chief Complaint  Patient presents with   Level 2 FOT   Code Sepsis       Brief Narrative (HPI from H&P)    68 y.o. male with medical history significant of COPD, severe aortic stenosis status post TAVR in July 2024, seizure disorder, chronic diastolic CHF (follows Dr. Mitzie Anda), atrial fibrillation on Eliquis , CVA, gout, OSA on nighttime CPAP, anxiety, BPH, chronic venous stasis, and morbid obesity presents after having an unwitnessed fall out of his wheelchair.   Records note patient had just recently been hospitalized 3/14-3/19 for sepsis with acute respiratory failure with hypoxia secondary to influenza A with pneumonia and COPD exacerbation.   The patient, who normally uses a wheelchair for mobility, experienced a fall after leaning forward attempting to pick something off the floor with his pick-up stick and hit his head.  Denied any loss of consciousness.  He was found to be febrile was COVID-19 positive, his only complaint was right foot pain, subsequently his blood cultures came back positive for gram-positive cocci 2 out of 2 when he was admitted.   Subjective:   Patient in bed, appears comfortable, denies any headache, no fever, no chest pain or pressure, no shortness of breath , no abdominal pain. No focal weakness.   Assessment  & Plan :   Sepsis, Incidental COVID-19 infection.  2 out of 2 blood culture positive for Enterococcus.  Source unclear, CT chest abdomen pelvis nonacute, his only complaint is  ongoing pain in his right foot which could be due to underlying gout, x-ray right foot, currently on IV daptomycin , ID following, appears nontoxic.  Continue supportive care with IV fluids and midodrine  for blood pressure, TTE and TEE pending.  Has underlying history of MR.  Monitor.   HX of gout, ongoing right foot pain for the last few weeks, twitching in the right leg.  Has history of gout and seizures, stable EEG, stable uric acid level stable, hold allopurinol , continue colchicine , even a trial of IV steroid on 11/28/2023, repeat on 11/29/2023, right foot pain and twitching considerably improved.  X-ray of the right foot nonacute.  Incidental 19 infection.  Acute but appears to be incidental.  No cough or shortness of breath, inflammatory markers stable, not a candidate for Paxlovid due to Eliquis .  Continue to monitor.   Hypotension Acute.  Initial blood pressures noted to be as low as 77/38.  Suspect secondary to acute infection.  Blood pressures improved with IV fluids. Blood pressure has stabilized on midodrine , challenge with low-dose diuretics and gentle down titration of midodrine .   Fall out of wheelchair - CT scan of the head and cervical spine did not note any acute abnormality and chronic changes as previously noted on previous MRI imaging from 3/3.  PT/OT to evaluate and treat.   COPD, without acute exacerbation at this time  Patient does not appear to have active wheezing on physical exam although decreased overall aeration. Albuterol  nebs as needed for shortness of breath/wheezing.   Paroxysmal atrial fibrillation, [place] vasc 2 score of greater than 3 on chronic anticoagulation  Patient appears to be in sinus rhythm at this time. Continue Eliquis .  Not on rate controlling agents.   Chr. Diastolic congestive heart failure - has mild fluid overload which is chronic, blood pressure too low holding diuretics.  Status post TAVR No acute issues noted per last echocardiogram from 06/2023,  transthoracic echocardiogram is stable however cannot rule out vegetation, will request cardiology to consider TEE if they can.   Seizure disorder  - Continue Keppra  and lacosamide , has been having twitching in his right lower extremity for a few days, unremarkable EEG.   Hyperlipidemia  - Continue fenofibrate  and atorvastatin    Anxiety and depression - Continue trazodone , mirtazapine , buspirone , venlafaxine , and aripiprazole    Morbid obesity - OSA on CPAP  BMI 46.22 kg/m, follow-up with PCP weight loss, continue nighttime CPAP.   Diabetes mellitus type 2, with long-term use of insulin  -  Patient is on sliding scale insulin , Prandin , and Mounjaro .  Continue Prandin  & SSI  Lab Results  Component Value Date   HGBA1C 6.6 (H) 10/04/2023   CBG (last 3)  Recent Labs    11/29/23 1643 11/29/23 2051 11/30/23 0923  GLUCAP 207* 177* 135*          Condition - Extremely Guarded  Family Communication  :  called [name] [phone] 11/28/2023 at 10 AM message left  Code Status :  DNR  Consults  :  ID, PCCM  PUD Prophylaxis :     Procedures  :     TEE   TTE 1. Left ventricular ejection fraction, by estimation, is 60 to 65%. The left ventricle has normal function. The left ventricle has no regional wall motion abnormalities. Left ventricular diastolic function could not be evaluated.  2. Right ventricular systolic function is normal. The right ventricular size is normal. Tricuspid regurgitation signal is inadequate for assessing PA pressure.  3. Moderate pericardial effusion. The pericardial effusion is circumferential.  4. The mitral valve is normal in structure. No evidence of mitral valve regurgitation. No evidence of mitral stenosis.  5. The aortic valve is normal in structure. Aortic valve regurgitation is not visualized. No aortic stenosis is present. There is a 34 Medtronic CoreValve-Evolut Pro prosthetic (TAVR) valve present in the aortic position. Aortic valve area, by VTI measures  1.58 cm. Aortic  valve mean gradient measures 19.0 mmHg. Aortic valve Vmax measures 2.63 m/s.  6. The inferior vena cava is dilated in size with <50% respiratory variability, suggesting right atrial pressure of 15 mmHg. Conclusion(s)/Recommendation(s): Images are not adequate to rule out vegetation. Recommend TEE for further evaluation.  EEG - non acute  CT head.  C-spine.    CT head: 1.  No evidence of an acute intracranial abnormality. 2. Forehead hematoma on the right. 3. Cerebral atrophy. 4. Paranasal sinus disease as described.   CT cervical spine: 1. No evidence of an acute cervical spine fracture. 2. Grade 1 anterolisthesis at C2-C3, C3-C4 and C4-C5. 3. Levocurvature of the cervical spine. 4. Cervical spondylosis as described within the body of the report. Multifactorial severe spinal canal stenosis at C3-C4 (as was demonstrated on the recent prior cervical spine MRI of 10/09/2023). Multilevel bony neural foraminal narrowing. 5. Erosions noted along the left C3-C4 facet joint, a finding which can be seen in setting of CPPD arthropathy. 6. 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  CT Chest, abdomen and pelvis-  1. No acute findings. 2. Aortic Atherosclerosis (ICD10-I70.0) and Emphysema (ICD10-J43.9). Coronary and systemic atherosclerosis. 3. Sigmoid colon diverticulosis. 4. Gluteal and upper thigh muscular atrophy with the exception of the hip adductor musculature. There is some paraspinal muscular atrophy and atrophy of the latissimus dorsi muscles. 5. Dorsal stimulator device noted electrodes at the T12-L1 level.      Disposition Plan  :    Status is: Inpatient   DVT Prophylaxis  :    Place TED hose Start: 11/28/23 1000 apixaban  (ELIQUIS ) tablet 5 mg     Lab Results  Component Value Date   PLT 182 11/30/2023    Diet :  Diet Order             Diet heart healthy/carb modified Room service appropriate? Yes; Fluid consistency: Thin  Diet effective now                     Inpatient Medications  Scheduled Meds:  apixaban   5 mg Oral BID   ARIPiprazole   5 mg Oral QHS   busPIRone   10 mg Oral TID   colchicine   0.6 mg Oral BID   cyanocobalamin   1,000 mcg Oral Daily   fenofibrate   160 mg Oral Daily   fluticasone   2 spray Each Nare Daily   guaiFENesin   600 mg Oral BID   insulin  aspart  0-5 Units Subcutaneous QHS   insulin  aspart  0-9 Units Subcutaneous TID WC   lacosamide   150 mg Oral BID   levETIRAcetam   500 mg Oral BID   midodrine   10 mg Oral TID WC   mirtazapine   15 mg Oral QHS   prazosin   2 mg Oral QHS   pregabalin   150 mg Oral TID   repaglinide   2 mg Oral BID   rOPINIRole   0.25 mg Oral BID   sodium chloride  flush  3 mL Intravenous Q12H   traZODone   200 mg Oral QHS   umeclidinium bromide   1 puff Inhalation Daily   venlafaxine  XR  150 mg Oral Q breakfast   Continuous Infusions:  ampicillin  (OMNIPEN) IV Stopped (11/30/23 0428)   DAPTOmycin  1,100 mg (11/29/23 1440)   PRN Meds:.acetaminophen  **OR** acetaminophen , albuterol , bisacodyl , HYDROcodone -acetaminophen , Muscle Rub, ondansetron  **OR** ondansetron  (ZOFRAN ) IV  Antibiotics  :    Anti-infectives (From admission, onward)    Start     Dose/Rate Route Frequency Ordered Stop   11/29/23 1400  DAPTOmycin  (CUBICIN ) 1,100 mg in sodium chloride  0.9 % IVPB        10 mg/kg  113.2 kg (Adjusted) 144 mL/hr over 30 Minutes Intravenous Daily 11/28/23 1408     11/28/23 1600  ampicillin  (OMNIPEN) 2 g in sodium chloride  0.9 % 100 mL IVPB        2 g 300 mL/hr over 20 Minutes Intravenous Every 4 hours 11/28/23 1453     11/28/23 0800  DAPTOmycin  (CUBICIN ) 1,200 mg in sodium chloride  0.9 % IVPB  Status:  Discontinued        1,200 mg 148 mL/hr over 30 Minutes Intravenous Daily 11/28/23 0545 11/28/23 1408   11/27/23 1015  piperacillin -tazobactam (ZOSYN ) IVPB 3.375 g        3.375 g 100 mL/hr over 30 Minutes Intravenous  Once 11/27/23 1007 11/27/23 1150   11/27/23 1015  vancomycin  (VANCOCIN ) IVPB 1000 mg/200 mL  premix  Status:  Discontinued        1,000 mg 200 mL/hr over 60 Minutes Intravenous  Once 11/27/23 1007 11/27/23 1011   11/27/23 1015  vancomycin  (VANCOREADY) IVPB 2000 mg/400 mL        2,000 mg 100 mL/hr over 240 Minutes Intravenous  Once 11/27/23 1012 11/27/23 1558         Objective:   Vitals:   11/30/23 0050 11/30/23 0410 11/30/23 0500 11/30/23 0925  BP: 130/68 114/70  119/67  Pulse: 76   75  Resp: 19   17  Temp: 98.4 F (36.9 C) 98.2 F (36.8 C)  97.6 F (36.4 C)  TempSrc: Oral Oral  Oral  SpO2: 92%   93%  Weight:   (!) 161.2 kg   Height:        Wt Readings from Last 3 Encounters:  11/30/23 (!) 161.2 kg  10/25/23 (!) 150 kg  10/10/23 (!) 157 kg     Intake/Output Summary (Last 24 hours) at 11/30/2023 0936 Last data filed at 11/30/2023 0600 Gross per 24 hour  Intake 672 ml  Output 3950 ml  Net -3278 ml     Physical Exam  Awake Alert, No new F.N deficits, Normal affect [place].AT,PERRAL Supple Neck, No JVD,   Symmetrical Chest wall movement, Good air movement bilaterally, CTAB RRR,No Gallops,Rubs or new Murmurs,  +ve B.Sounds, Abd Soft, No tenderness,   Trace edema, mild R foot tenderness, bilateral first MTP bunions    Data Review:    Recent Labs  Lab 11/27/23 0933 11/27/23 1054 11/28/23 0435 11/29/23 0516 11/30/23 0511  WBC 8.1  --  6.7 4.6 4.9  HGB 13.6 13.6 12.3* 12.0* 12.2*  HCT 40.5 40.0 37.2* 35.9* 37.1*  PLT 184  --  153 185 182  MCV 91.4  --  91.0 89.5 90.7  MCH 30.7  --  30.1 29.9 29.8  MCHC 33.6  --  33.1 33.4 32.9  RDW 13.8  --  14.0 13.2 13.2  LYMPHSABS 0.6*  --   --  0.8 1.5  MONOABS 0.6  --   --  0.5 0.6  EOSABS 0.1  --   --  0.0 0.1  BASOSABS 0.0  --   --  0.0 0.0    Recent Labs  Lab 11/27/23 0933 11/27/23 0959 11/27/23 1054 11/27/23 1300 11/27/23 2109 11/28/23 0435 11/29/23 0516 11/30/23 0511  NA 137  --  139  --   --  137 140 141  K 3.7  --  3.6  --   --  3.2* 3.8 3.7  CL 101  --  99  --   --  104 108 106  CO2 26   --   --   --   --  26 24 26   ANIONGAP 10  --   --   --   --  7 8 9   GLUCOSE 196*  --  192*  --   --  97 129* 100*  BUN 14  --  16  --   --  13 15 18   CREATININE 1.12  --  1.10  --   --  1.05 0.84 1.03  AST 25  --   --   --   --   --   --   --   ALT 25  --   --   --   --   --   --   --   ALKPHOS 43  --   --   --   --   --   --   --   BILITOT 0.9  --   --   --   --   --   --   --   ALBUMIN 3.3*  --   --   --   --   --   --   --   CRP  --   --   --   --   --  4.1* 4.4* 1.1*  PROCALCITON  --   --   --   --  0.39 0.35 0.17 <0.10  LATICACIDVEN  --  1.8  --  0.8  --   --   --   --   INR 1.1  --   --   --   --   --   --   --   BNP  --   --   --   --  74.8 94.5 149.1* 144.4*  MG  --   --   --   --   --   --  2.2 2.0  CALCIUM  8.7*  --   --   --   --  8.4* 8.9 8.9      Recent Labs  Lab 11/27/23 0933 11/27/23 0959 11/27/23 1300 11/27/23 2109 11/28/23 0435 11/29/23 0516 11/30/23 0511  CRP  --   --   --   --  4.1* 4.4* 1.1*  PROCALCITON  --   --   --  0.39 0.35 0.17 <0.10  LATICACIDVEN  --  1.8 0.8  --   --   --   --   INR 1.1  --   --   --   --   --   --   BNP  --   --   --  74.8 94.5 149.1* 144.4*  MG  --   --   --   --   --  2.2 2.0  CALCIUM  8.7*  --   --   --  8.4* 8.9 8.9    --------------------------------------------------------------------------------------------------------------- Lab Results  Component Value Date   CHOL 151 06/25/2023   HDL 30 (L) 06/25/2023   LDLCALC 79 06/25/2023   TRIG 209 (H) 06/25/2023   CHOLHDL 5.0 06/25/2023    Lab Results  Component Value Date   HGBA1C 6.6 (H) 10/04/2023   No results for input(s): "TSH", "T4TOTAL", "FREET4", "T3FREE", "THYROIDAB" in the last 72 hours. No results for input(s): "VITAMINB12", "FOLATE", "FERRITIN", "TIBC", "IRON", "RETICCTPCT" in the last 72 hours. ------------------------------------------------------------------------------------------------------------------ Cardiac Enzymes No results for input(s): "CKMB",  "TROPONINI", "MYOGLOBIN" in the last 168 hours.  Invalid input(s): "CK"  Micro Results Recent Results (from the past 240 hours)  Resp panel by RT-PCR (RSV, Flu A&B, Covid) Anterior Nasal Swab     Status: Abnormal   Collection Time: 11/27/23  9:33 AM   Specimen: Anterior Nasal Swab  Result Value Ref Range Status   SARS Coronavirus 2 by RT PCR POSITIVE (A) NEGATIVE Final   Influenza A by PCR NEGATIVE NEGATIVE Final   Influenza B by PCR NEGATIVE NEGATIVE Final    Comment: (NOTE) The Xpert Xpress SARS-CoV-2/FLU/RSV plus assay is intended as an aid in the diagnosis of influenza from Nasopharyngeal swab specimens and should not be used as a sole basis for treatment. Nasal washings and aspirates are unacceptable for Xpert Xpress SARS-CoV-2/FLU/RSV testing.  Fact Sheet for Patients: BloggerCourse.com  Fact Sheet for Healthcare Providers: SeriousBroker.it  This test is not yet approved or cleared by the [place]  FDA and has been authorized for detection and/or diagnosis of SARS-CoV-2 by FDA under an Emergency Use Authorization (EUA). This EUA will remain in effect (meaning this test can be used) for the duration of the COVID-19 declaration under Section 564(b)(1) of the Act, 21 U.S.C. section 360bbb-3(b)(1), unless the authorization is terminated or revoked.     Resp Syncytial Virus by PCR NEGATIVE NEGATIVE Final    Comment: (NOTE) Fact Sheet for Patients: BloggerCourse.com  Fact Sheet for Healthcare Providers: SeriousBroker.it  This test is not yet approved or cleared by the [place]  FDA and has been authorized for detection and/or diagnosis of SARS-CoV-2 by FDA under an Emergency Use Authorization (EUA). This EUA will remain in effect (meaning this test can be used) for the duration of the COVID-19 declaration under Section 564(b)(1) of the Act, 21 U.S.C. section  360bbb-3(b)(1), unless the authorization is terminated or revoked.  Performed at [facility], [address] [place], [place] [address]   Blood Culture (routine x 2)     Status: Abnormal (Preliminary result)   Collection Time: 11/27/23  9:33 AM   Specimen: BLOOD  Result Value Ref Range Status   Specimen Description BLOOD LEFT ANTECUBITAL  Final   Special Requests   Final    BOTTLES DRAWN AEROBIC AND ANAEROBIC Blood Culture results may not be optimal due to an inadequate volume of blood received in culture bottles   Culture  Setup Time   Final    GRAM POSITIVE COCCI IN BOTH AEROBIC AND ANAEROBIC BOTTLES CRITICAL RESULT CALLED TO, READ BACK BY AND VERIFIED WITH: PHARMD GREG A [id number] FCP    Culture (A)  Final    ENTEROCOCCUS FAECIUM SUSCEPTIBILITIES TO FOLLOW Performed at [facility], [address], [place], [place] [address]    Report Status PENDING  Incomplete  Blood Culture ID Panel (Reflexed)     Status: Abnormal   Collection Time: 11/27/23  9:33 AM  Result Value Ref Range Status   Enterococcus faecalis NOT DETECTED NOT DETECTED Final   Enterococcus Faecium DETECTED (A) NOT DETECTED Final    Comment: CRITICAL RESULT CALLED TO, READ BACK BY AND VERIFIED WITH: PHARMD GREG A [id number] FCP      Listeria monocytogenes NOT DETECTED NOT DETECTED Final   Staphylococcus species NOT DETECTED NOT DETECTED Final   Staphylococcus aureus (BCID) NOT DETECTED NOT DETECTED Final   Staphylococcus epidermidis NOT DETECTED NOT DETECTED Final   Staphylococcus lugdunensis NOT DETECTED NOT DETECTED Final   Streptococcus species NOT DETECTED NOT DETECTED Final   Streptococcus agalactiae NOT DETECTED NOT DETECTED Final  Streptococcus pneumoniae NOT DETECTED NOT DETECTED Final   Streptococcus pyogenes NOT DETECTED NOT DETECTED Final   A.calcoaceticus-baumannii NOT DETECTED NOT DETECTED Final   Bacteroides fragilis NOT DETECTED NOT DETECTED Final   Enterobacterales NOT  DETECTED NOT DETECTED Final   Enterobacter cloacae complex NOT DETECTED NOT DETECTED Final   Escherichia coli NOT DETECTED NOT DETECTED Final   Klebsiella aerogenes NOT DETECTED NOT DETECTED Final   Klebsiella oxytoca NOT DETECTED NOT DETECTED Final   Klebsiella pneumoniae NOT DETECTED NOT DETECTED Final   Proteus species NOT DETECTED NOT DETECTED Final   Salmonella species NOT DETECTED NOT DETECTED Final   Serratia marcescens NOT DETECTED NOT DETECTED Final   Haemophilus influenzae NOT DETECTED NOT DETECTED Final   Neisseria meningitidis NOT DETECTED NOT DETECTED Final   Pseudomonas aeruginosa NOT DETECTED NOT DETECTED Final   Stenotrophomonas maltophilia NOT DETECTED NOT DETECTED Final   Candida albicans NOT DETECTED NOT DETECTED Final   Candida auris NOT DETECTED NOT DETECTED Final   Candida glabrata NOT DETECTED NOT DETECTED Final   Candida krusei NOT DETECTED NOT DETECTED Final   Candida parapsilosis NOT DETECTED NOT DETECTED Final   Candida tropicalis NOT DETECTED NOT DETECTED Final   Cryptococcus neoformans/gattii NOT DETECTED NOT DETECTED Final   Vancomycin  resistance DETECTED (A) NOT DETECTED Final    Comment: CRITICAL RESULT CALLED TO, READ BACK BY AND VERIFIED WITH: Alycia Babcock A [id number] FCP   Performed at [facility], [address], [place], [place] [address]   Blood Culture (routine x 2)     Status: Abnormal (Preliminary result)   Collection Time: 11/27/23 10:23 AM   Specimen: BLOOD  Result Value Ref Range Status   Specimen Description BLOOD RIGHT ANTECUBITAL  Final   Special Requests   Final    BOTTLES DRAWN AEROBIC AND ANAEROBIC Blood Culture results may not be optimal due to an inadequate volume of blood received in culture bottles   Culture  Setup Time   Final    GRAM POSITIVE COCCI IN BOTH AEROBIC AND ANAEROBIC BOTTLES CRITICAL VALUE NOTED.  VALUE IS CONSISTENT WITH PREVIOUSLY REPORTED AND CALLED VALUE. Performed at [facility], [address], [place], [place] [address]    Culture ENTEROCOCCUS FAECIUM (A)  Final   Report Status PENDING  Incomplete  MRSA Next Gen by PCR, Nasal     Status: None   Collection Time: 11/28/23  5:52 AM   Specimen: Nasal Mucosa; Nasal Swab  Result Value Ref Range Status   MRSA by PCR Next Gen NOT DETECTED NOT DETECTED Final    Comment: (NOTE) The GeneXpert MRSA Assay (FDA approved for NASAL specimens only), is one component of a comprehensive MRSA colonization surveillance program. It is not intended to diagnose MRSA infection nor to guide or monitor treatment for MRSA infections. Test performance is not FDA approved in patients less than 44 years old. Performed at [facility], [address], [place], [place] [address]     Radiology Report ECHOCARDIOGRAM COMPLETE Result Date: 11/29/2023    ECHOCARDIOGRAM REPORT   Patient Name:   KAILER HEINDEL Date of Exam: 11/29/2023 Medical Rec #:  [id number]        Height:       74.0 in Accession #:    [id number]       Weight:       354.7 lb Date of Birth:  06-17-1956        BSA:  2.773 m Patient Age:    67 years         BP:           112/54 mmHg Patient Gender: M                HR:           53 bpm. Exam Location:  Inpatient Procedure: 2D Echo, Cardiac Doppler and Color Doppler (Both Spectral and Color            Flow Doppler were utilized during procedure). Indications:    Endocarditis  History:        Patient has prior history of Echocardiogram examinations, most                 recent 06/26/2023. CHF, CAD, COPD, Aortic Valve Disease; Risk                 Factors:Sleep Apnea, Diabetes and Hypertension.                 Aortic Valve: 34 Medtronic CoreValve-Evolut Pro prosthetic,                 stented (TAVR) valve is present in the aortic position.  Sonographer:    Astrid Blamer Referring Phys: Gibson Kurtz, N IMPRESSIONS  1. Left ventricular ejection fraction, by estimation, is 60 to 65%. The left ventricle has normal function. The left ventricle  has no regional wall motion abnormalities. Left ventricular diastolic function could not be evaluated.  2. Right ventricular systolic function is normal. The right ventricular size is normal. Tricuspid regurgitation signal is inadequate for assessing PA pressure.  3. Moderate pericardial effusion. The pericardial effusion is circumferential.  4. The mitral valve is normal in structure. No evidence of mitral valve regurgitation. No evidence of mitral stenosis.  5. The aortic valve is normal in structure. Aortic valve regurgitation is not visualized. No aortic stenosis is present. There is a 34 Medtronic CoreValve-Evolut Pro prosthetic (TAVR) valve present in the aortic position. Aortic valve area, by VTI measures 1.58 cm. Aortic valve mean gradient measures 19.0 mmHg. Aortic valve Vmax measures 2.63 m/s.  6. The inferior vena cava is dilated in size with <50% respiratory variability, suggesting right atrial pressure of 15 mmHg. Conclusion(s)/Recommendation(s): Images are not adequate to rule out vegetation. Recommend TEE for further evaluation. FINDINGS  Left Ventricle: Left ventricular ejection fraction, by estimation, is 60 to 65%. The left ventricle has normal function. The left ventricle has no regional wall motion abnormalities. The left ventricular internal cavity size was normal in size. There is  no left ventricular hypertrophy. Left ventricular diastolic function could not be evaluated. Right Ventricle: The right ventricular size is normal. No increase in right ventricular wall thickness. Right ventricular systolic function is normal. Tricuspid regurgitation signal is inadequate for assessing PA pressure. Left Atrium: Left atrial size was normal in size. Right Atrium: Right atrial size was normal in size. Pericardium: A moderately sized pericardial effusion is present. The pericardial effusion is circumferential. Mitral Valve: The mitral valve is normal in structure. Mild mitral annular calcification. No  evidence of mitral valve regurgitation. No evidence of mitral valve stenosis. Tricuspid Valve: The tricuspid valve is normal in structure. Tricuspid valve regurgitation is not demonstrated. No evidence of tricuspid stenosis. Aortic Valve: The aortic valve is normal in structure. Aortic valve regurgitation is not visualized. No aortic stenosis is present. Aortic valve mean gradient measures 19.0 mmHg. Aortic valve peak gradient measures 27.7 mmHg. Aortic valve  area, by VTI measures 1.58 cm. There is a 34 Medtronic CoreValve-Evolut Pro prosthetic, stented (TAVR) valve present in the aortic position. Pulmonic Valve: The pulmonic valve was normal in structure. Pulmonic valve regurgitation is not visualized. No evidence of pulmonic stenosis. Aorta: The aortic root is normal in size and structure. Venous: The inferior vena cava is dilated in size with less than 50% respiratory variability, suggesting right atrial pressure of 15 mmHg. IAS/Shunts: No atrial level shunt detected by color flow Doppler.  LEFT VENTRICLE PLAX 2D LVIDd:         4.90 cm LVIDs:         3.60 cm LV PW:         1.00 cm LV IVS:        1.20 cm LVOT diam:     1.80 cm LV SV:         97 LV SV Index:   35 LVOT Area:     2.54 cm  RIGHT VENTRICLE RV S prime:     15.70 cm/s TAPSE (M-mode): 2.9 cm LEFT ATRIUM              Index        RIGHT ATRIUM           Index LA Vol (A2C):   104.0 ml 37.51 ml/m  RA Area:     21.20 cm LA Vol (A4C):   59.0 ml  21.28 ml/m  RA Volume:   62.00 ml  22.36 ml/m LA Biplane Vol: 85.5 ml  30.84 ml/m  AORTIC VALVE AV Area (Vmax):    1.34 cm AV Area (Vmean):   1.39 cm AV Area (VTI):     1.58 cm AV Vmax:           263.00 cm/s AV Vmean:          212.000 cm/s AV VTI:            0.617 m AV Peak Grad:      27.7 mmHg AV Mean Grad:      19.0 mmHg LVOT Vmax:         138.00 cm/s LVOT Vmean:        116.000 cm/s LVOT VTI:          0.382 m LVOT/AV VTI ratio: 0.62  SHUNTS Systemic VTI:  0.38 m Systemic Diam: 1.80 cm Gaylyn Keas MD  Electronically signed by Gaylyn Keas MD Signature Date/Time: 11/29/2023/4:10:14 PM    Final    EEG adult Result Date: 11/28/2023 Arleene Lack, MD     11/28/2023 12:53 PM Patient Name: GARET HOOTON MRN: [id number] Epilepsy Attending: Arleene Lack Referring Physician/Provider: Cala Castleman, MD Date: 11/28/2023 Duration: 22.33 mins Patient history: 68yo M has been having twitching in his right lower extremity for a few days. EEG to evaluate for seizure Level of alertness: Awake, asleep AEDs during EEG study: LEV, LCM Technical aspects: This EEG study was done with scalp electrodes positioned according to the 10-20 International system of electrode placement. Electrical activity was reviewed with band pass filter of 1-70Hz , sensitivity of 7 uV/mm, display speed of 26mm/sec with a 60Hz  notched filter applied as appropriate. EEG data were recorded continuously and digitally stored.  Video monitoring was available and reviewed as appropriate. Description: The posterior dominant rhythm consists of 7.5 Hz activity of moderate voltage (25-35 uV) seen predominantly in posterior head regions, symmetric and reactive to eye opening and eye closing. Sleep was characterized by vertex waves, sleep spindles (12  to 14 Hz), maximal frontocentral region. Physiologic photic driving was not seen during photic stimulation.  Hyperventilation was not performed.   IMPRESSION: This study is within normal limits. No seizures or definite epileptiform discharges were seen throughout the recording.  A normal interictal EEG does not exclude the diagnosis of epilepsy. Priyanka Suzanne Erps   DG Foot Complete Right Result Date: 11/28/2023 CLINICAL DATA:  Right foot pain EXAM: RIGHT FOOT COMPLETE - 3+ VIEW COMPARISON:  None Available. 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 IMPRESSION: Osteoarthrosis of  the first metatarsophalangeal joint with hallux valgus and bunion deformity. Electronically Signed   By: Fredrich Jefferson M.D.   On: 11/28/2023 12:02     Signature  -   Lynnwood Sauer M.D on 11/30/2023 at 9:36 AM   -  To page go to [email or web address]

## 2023-12-01 DIAGNOSIS — U071 COVID-19: Secondary | ICD-10-CM | POA: Diagnosis not present

## 2023-12-01 LAB — GLUCOSE, CAPILLARY
Glucose-Capillary: 124 mg/dL — ABNORMAL HIGH (ref 70–99)
Glucose-Capillary: 86 mg/dL (ref 70–99)
Glucose-Capillary: 146 mg/dL — ABNORMAL HIGH (ref 70–99)
Glucose-Capillary: 151 mg/dL — ABNORMAL HIGH (ref 70–99)

## 2023-12-01 LAB — CBC WITH DIFFERENTIAL/PLATELET
Abs Immature Granulocytes: 0.05 10*3/uL (ref 0.00–0.07)
Basophils Absolute: 0 10*3/uL (ref 0.0–0.1)
Basophils Relative: 1 %
Eosinophils Absolute: 0.2 10*3/uL (ref 0.0–0.5)
Eosinophils Relative: 5 %
HCT: 38.1 % — ABNORMAL LOW (ref 39.0–52.0)
Hemoglobin: 12.6 g/dL — ABNORMAL LOW (ref 13.0–17.0)
Immature Granulocytes: 1 %
Lymphocytes Relative: 30 %
Lymphs Abs: 1.5 10*3/uL (ref 0.7–4.0)
MCH: 30.1 pg (ref 26.0–34.0)
MCHC: 33.1 g/dL (ref 30.0–36.0)
MCV: 91.1 fL (ref 80.0–100.0)
Monocytes Absolute: 0.5 10*3/uL (ref 0.1–1.0)
Monocytes Relative: 10 %
Neutro Abs: 2.6 10*3/uL (ref 1.7–7.7)
Neutrophils Relative %: 53 %
Platelets: 195 10*3/uL (ref 150–400)
RBC: 4.18 MIL/uL — ABNORMAL LOW (ref 4.22–5.81)
RDW: 13.3 % (ref 11.5–15.5)
WBC: 4.9 10*3/uL (ref 4.0–10.5)
nRBC: 0 % (ref 0.0–0.2)

## 2023-12-01 LAB — CK: Total CK: 19 U/L — ABNORMAL LOW (ref 49–397)

## 2023-12-01 LAB — BASIC METABOLIC PANEL WITH GFR
Anion gap: 11 (ref 5–15)
BUN: 17 mg/dL (ref 8–23)
CO2: 26 mmol/L (ref 22–32)
Calcium: 8.8 mg/dL — ABNORMAL LOW (ref 8.9–10.3)
Chloride: 104 mmol/L (ref 98–111)
Creatinine, Ser: 1.02 mg/dL (ref 0.61–1.24)
GFR, Estimated: >60 mL/min (ref >60)
Glucose, Bld: 76 mg/dL (ref 70–99)
Potassium: 3.3 mmol/L — ABNORMAL LOW (ref 3.5–5.1)
Sodium: 141 mmol/L (ref 135–145)

## 2023-12-01 LAB — C-REACTIVE PROTEIN: CRP: 0.6 mg/dL (ref <1.0)

## 2023-12-01 LAB — MAGNESIUM: Magnesium: 2 mg/dL (ref 1.7–2.4)

## 2023-12-01 LAB — BRAIN NATRIURETIC PEPTIDE: B Natriuretic Peptide: 96.6 pg/mL (ref 0.0–100.0)

## 2023-12-01 LAB — PROCALCITONIN: Procalcitonin: <0.1 ng/mL

## 2023-12-01 MED ORDER — SPIRONOLACTONE 25 MG PO TABS
50.0000 mg | ORAL_TABLET | Freq: Every day | ORAL | Status: DC
  Administered 2023-12-01 – 2023-12-08 (×8): 50 mg via ORAL
  Filled 2023-12-01 (×8): qty 2

## 2023-12-01 MED ORDER — POTASSIUM CHLORIDE CRYS ER 20 MEQ PO TBCR
40.0000 meq | EXTENDED_RELEASE_TABLET | Freq: Once | ORAL | Status: AC
  Administered 2023-12-01: 40 meq via ORAL
  Filled 2023-12-01: qty 2

## 2023-12-01 NOTE — Plan of Care (Signed)
   Problem: Education: Goal: Knowledge of risk factors and measures for prevention of condition will improve Outcome: Progressing   Problem: Coping: Goal: Psychosocial and spiritual needs will be supported Outcome: Progressing   Problem: Respiratory: Goal: Will maintain a patent airway Outcome: Progressing Goal: Complications related to the disease process, condition or treatment will be avoided or minimized Outcome: Progressing

## 2023-12-01 NOTE — TOC Progression Note (Addendum)
 Transition of Care [facility]) - Progression Note    Patient Details  Name: Lawrence [name] MRN: [id number] Date of Birth: 27-Mar-1956  [facility] CM/SW Contact  Gursimran Litaker A [place], LCSW Phone Number: 12/01/2023, 3:04 PM  Clinical Narrative:     CSW withdrew authorization as pt's insurance as pt not yet medically stable. Will restart once pt is closer to being stable.     TOC will continue to follow.   Expected Discharge Plan: Skilled Nursing Facility Barriers to Discharge: Continued Medical Work up  Expected Discharge Plan and Services In-house Referral: Clinical Social Work   Post Acute Care Choice: Skilled Nursing Facility Living arrangements for the past 2 months: Skilled Nursing Facility                                       Social Determinants of Health (SDOH) Interventions SDOH Screenings   Food Insecurity: No Food Insecurity (11/29/2023)  Housing: Low Risk  (11/29/2023)  Transportation Needs: No Transportation Needs (11/29/2023)  Recent Concern: Transportation Needs - Unmet Transportation Needs (10/03/2023)  Utilities: Not At Risk (11/29/2023)  Alcohol  Screen: Low Risk  (06/08/2021)  Financial Resource Strain: Medium Risk (12/06/2021)   Received from [facility], [facility]  Physical Activity: Unknown (12/06/2021)   Received from [facility], [facility]  Social Connections: Socially Isolated (11/29/2023)  Stress: No Stress Concern Present (01/21/2022)   Received from [facility], [facility]  Recent Concern: Stress - Stress Concern Present (11/19/2021)   Received from [facility]  Tobacco Use: Medium Risk (10/20/2023)    Readmission Risk Interventions    11/28/2023    2:45 PM 07/24/2023   11:56 AM 05/08/2023    4:37 PM  Readmission Risk Prevention Plan  Transportation Screening Complete Complete Complete  Medication Review Oceanographer) Complete Complete Complete  PCP or Specialist appointment within 3-5 days of discharge Complete  Complete Complete  HRI or Home Care Consult Complete Complete Complete  SW Recovery Care/Counseling Consult Complete Complete Complete  Palliative Care Screening Not Applicable Not Applicable Complete  Skilled Nursing Facility Complete Complete Not Applicable

## 2023-12-01 NOTE — Progress Notes (Signed)
 PROGRESS NOTE                                                                                                                                                                                                             Patient Demographics:    Lawrence [name], is a 68 y.o. male, DOB - [birth year], ZOX:[id number]  Outpatient Primary MD for the patient is Darnelle Elders, PA-C    LOS - 3  Admit date - 11/27/2023    Chief Complaint  Patient presents with   Level 2 FOT   Code Sepsis       Brief Narrative (HPI from H&P)    68 y.o. male with medical history significant of COPD, severe aortic stenosis status post TAVR in July 2024, seizure disorder, chronic diastolic CHF (follows Dr. Mitzie Anda), atrial fibrillation on Eliquis , CVA, gout, OSA on nighttime CPAP, anxiety, BPH, chronic venous stasis, and morbid obesity presents after having an unwitnessed fall out of his wheelchair.   Records note patient had just recently been hospitalized 3/14-3/19 for sepsis with acute respiratory failure with hypoxia secondary to influenza A with pneumonia and COPD exacerbation.   The patient, who normally uses a wheelchair for mobility, experienced a fall after leaning forward attempting to pick something off the floor with his pick-up stick and hit his head.  Denied any loss of consciousness.  He was found to be febrile was COVID-19 positive, his only complaint was right foot pain, subsequently his blood cultures came back positive for gram-positive cocci 2 out of 2 when he was admitted.   Subjective:   Patient in bed, appears comfortable, denies any headache, no fever, no chest pain or pressure, no shortness of breath , no abdominal pain. No focal weakness.   Assessment  & Plan :   Sepsis, Incidental COVID-19 infection.  2 out of 2 blood culture positive for Enterococcus.  Source unclear, CT chest abdomen pelvis nonacute, his only complaint is  ongoing pain in his right foot which could be due to underlying gout, x-ray right foot, currently on IV Daptomycin  >> Ampicillin  12/01/23, ID following, appears nontoxic.  Continue supportive care with IV fluids and midodrine  for blood pressure, TTE stable, TEE after he finishes his COVID isolation.  Has underlying history of MR.  Monitor.   Status post TAVR No acute issues noted per last echocardiogram  from 06/2023, kindly see echo above.  HX of gout, ongoing right foot pain for the last few weeks, twitching in the right leg.  Has history of gout and seizures, stable EEG, stable uric acid level stable, hold allopurinol , continue colchicine , even a trial of IV steroid on 11/28/2023, repeat on 11/29/2023, right foot pain and twitching considerably improved.  X-ray of the right foot nonacute.  Incidental 19 infection.  Acute but appears to be incidental.  No cough or shortness of breath, inflammatory markers stable, not a candidate for Paxlovid due to Eliquis .  Continue to monitor.   Hypotension Acute.  Initial blood pressures noted to be as low as 77/38.  Suspect secondary to acute infection.  Blood pressures improved with IV fluids. Blood pressure has stabilized on midodrine , challenge with low-dose diuretics and gentle down titration of midodrine .   Fall out of wheelchair - CT scan of the head and cervical spine did not note any acute abnormality and chronic changes as previously noted on previous MRI imaging from 3/3.  PT/OT to evaluate and treat.   COPD, without acute exacerbation at this time  Patient does not appear to have active wheezing on physical exam although decreased overall aeration. Albuterol  nebs as needed for shortness of breath/wheezing.   Paroxysmal atrial fibrillation, [place] vasc 2 score of greater than 3 on chronic anticoagulation  Patient appears to be in sinus rhythm at this time. Continue Eliquis .  Not on rate controlling agents.   Chr. Diastolic congestive heart failure - has  mild fluid overload which is chronic, blood pressure too low holding diuretics.  Seizure disorder  - Continue Keppra  and lacosamide , has been having twitching in his right lower extremity for a few days, unremarkable EEG.   Hyperlipidemia  - Continue fenofibrate  and atorvastatin    Anxiety and depression - Continue trazodone , mirtazapine , buspirone , venlafaxine , and aripiprazole    Morbid obesity - OSA on CPAP  BMI 46.22 kg/m, follow-up with PCP weight loss, continue nighttime CPAP.   Diabetes mellitus type 2, with long-term use of insulin  -  Patient is on sliding scale insulin , Prandin , and Mounjaro .  Continue Prandin  & SSI  Lab Results  Component Value Date   HGBA1C 6.6 (H) 10/04/2023   CBG (last 3)  Recent Labs    11/30/23 1212 11/30/23 1640 11/30/23 2104  GLUCAP 101* 118* 116*          Condition - Extremely Guarded  Family Communication  :  called [name] [phone] 11/28/2023 at 10 AM message left  Code Status :  DNR  Consults  :  ID, PCCM  PUD Prophylaxis :     Procedures  :     TEE   TTE 1. Left ventricular ejection fraction, by estimation, is 60 to 65%. The left ventricle has normal function. The left ventricle has no regional wall motion abnormalities. Left ventricular diastolic function could not be evaluated.  2. Right ventricular systolic function is normal. The right ventricular size is normal. Tricuspid regurgitation signal is inadequate for assessing PA pressure.  3. Moderate pericardial effusion. The pericardial effusion is circumferential.  4. The mitral valve is normal in structure. No evidence of mitral valve regurgitation. No evidence of mitral stenosis.  5. The aortic valve is normal in structure. Aortic valve regurgitation is not visualized. No aortic stenosis is present. There is a 34 Medtronic CoreValve-Evolut Pro prosthetic (TAVR) valve present in the aortic position. Aortic valve area, by VTI measures 1.58 cm. Aortic valve mean gradient measures  19.0 mmHg. Aortic  valve Vmax measures 2.63 m/s.  6. The inferior vena cava is dilated in size with <50% respiratory variability, suggesting right atrial pressure of 15 mmHg. Conclusion(s)/Recommendation(s): Images are not adequate to rule out vegetation. Recommend TEE for further evaluation.  EEG - non acute  CT head.  C-spine.    CT head: 1.  No evidence of an acute intracranial abnormality. 2. Forehead hematoma on the right. 3. Cerebral atrophy. 4. Paranasal sinus disease as described.   CT cervical spine: 1. No evidence of an acute cervical spine fracture. 2. Grade 1 anterolisthesis at C2-C3, C3-C4 and C4-C5. 3. Levocurvature of the cervical spine. 4. Cervical spondylosis as described within the body of the report. Multifactorial severe spinal canal stenosis at C3-C4 (as was demonstrated on the recent prior cervical spine MRI of 10/09/2023). Multilevel bony neural foraminal narrowing. 5. Erosions noted along the left C3-C4 facet joint, a finding which can be seen in setting of CPPD arthropathy. 6. 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  CT Chest, abdomen and pelvis-  1. No acute findings. 2. Aortic Atherosclerosis (ICD10-I70.0) and Emphysema (ICD10-J43.9). Coronary and systemic atherosclerosis. 3. Sigmoid colon diverticulosis. 4. Gluteal and upper thigh muscular atrophy with the exception of the hip adductor musculature. There is some paraspinal muscular atrophy and atrophy of the latissimus dorsi muscles. 5. Dorsal stimulator device noted electrodes at the T12-L1 level.      Disposition Plan  :    Status is: Inpatient   DVT Prophylaxis  :    Place TED hose Start: 11/28/23 1000 apixaban  (ELIQUIS ) tablet 5 mg     Lab Results  Component Value Date   PLT 195 12/01/2023    Diet :  Diet Order             Diet heart healthy/carb modified Room service appropriate? Yes; Fluid consistency: Thin  Diet effective now                    Inpatient Medications  Scheduled Meds:   apixaban   5 mg Oral BID   ARIPiprazole   5 mg Oral QHS   busPIRone   10 mg Oral TID   colchicine   0.6 mg Oral BID   cyanocobalamin   1,000 mcg Oral Daily   fenofibrate   160 mg Oral Daily   fluticasone   2 spray Each Nare Daily   guaiFENesin   600 mg Oral BID   insulin  aspart  0-5 Units Subcutaneous QHS   insulin  aspart  0-9 Units Subcutaneous TID WC   lacosamide   150 mg Oral BID   levETIRAcetam   500 mg Oral BID   midodrine   10 mg Oral TID WC   mirtazapine   15 mg Oral QHS   prazosin   2 mg Oral QHS   pregabalin   150 mg Oral TID   repaglinide   2 mg Oral BID   rOPINIRole   0.25 mg Oral BID   sodium chloride  flush  3 mL Intravenous Q12H   spironolactone   25 mg Oral Daily   torsemide   20 mg Oral Daily   traZODone   200 mg Oral QHS   umeclidinium bromide   1 puff Inhalation Daily   venlafaxine  XR  150 mg Oral Q breakfast   Continuous Infusions:  ampicillin  (OMNIPEN) IV 2 g (12/01/23 0611)   DAPTOmycin  Stopped (11/30/23 1741)   PRN Meds:.acetaminophen  **OR** acetaminophen , albuterol , bisacodyl , HYDROcodone -acetaminophen , Muscle Rub, ondansetron  **OR** ondansetron  (ZOFRAN ) IV     Objective:   Vitals:   11/30/23 1959 11/30/23 2320 12/01/23 0410 12/01/23 0500  BP: Aaron Aas)  118/58 125/63 127/70   Pulse:  85 67   Resp:      Temp: 97.8 F (36.6 C) 98.3 F (36.8 C) (!) 97.3 F (36.3 C)   TempSrc: Oral Oral Oral   SpO2: 93% 96% 97%   Weight:    (!) 156.4 kg  Height:        Wt Readings from Last 3 Encounters:  12/01/23 (!) 156.4 kg  10/25/23 (!) 150 kg  10/10/23 (!) 157 kg     Intake/Output Summary (Last 24 hours) at 12/01/2023 0806 Last data filed at 12/01/2023 5621 Gross per 24 hour  Intake 1426.04 ml  Output 5150 ml  Net -3723.96 ml     Physical Exam  Awake Alert, No new F.N deficits, Normal affect Luis M. Cintron.AT,PERRAL Supple Neck, No JVD,   Symmetrical Chest wall movement, Good air movement bilaterally, CTAB RRR,No Gallops,Rubs or new Murmurs,  +ve B.Sounds, Abd Soft, No  tenderness,   Trace edema, mild R foot tenderness, bilateral first MTP bunions    Data Review:    Recent Labs  Lab 11/27/23 0933 11/27/23 1054 11/28/23 0435 11/29/23 0516 11/30/23 0511 12/01/23 0442  WBC 8.1  --  6.7 4.6 4.9 4.9  HGB 13.6 13.6 12.3* 12.0* 12.2* 12.6*  HCT 40.5 40.0 37.2* 35.9* 37.1* 38.1*  PLT 184  --  153 185 182 195  MCV 91.4  --  91.0 89.5 90.7 91.1  MCH 30.7  --  30.1 29.9 29.8 30.1  MCHC 33.6  --  33.1 33.4 32.9 33.1  RDW 13.8  --  14.0 13.2 13.2 13.3  LYMPHSABS 0.6*  --   --  0.8 1.5 1.5  MONOABS 0.6  --   --  0.5 0.6 0.5  EOSABS 0.1  --   --  0.0 0.1 0.2  BASOSABS 0.0  --   --  0.0 0.0 0.0    Recent Labs  Lab 11/27/23 0933 11/27/23 0959 11/27/23 1054 11/27/23 1300 11/27/23 2109 11/28/23 0435 11/29/23 0516 11/30/23 0511 12/01/23 0442  NA 137  --  139  --   --  137 140 141 141  K 3.7  --  3.6  --   --  3.2* 3.8 3.7 3.3*  CL 101  --  99  --   --  104 108 106 104  CO2 26  --   --   --   --  26 24 26 26   ANIONGAP 10  --   --   --   --  7 8 9 11   GLUCOSE 196*  --  192*  --   --  97 129* 100* 76  BUN 14  --  16  --   --  13 15 18 17   CREATININE 1.12  --  1.10  --   --  1.05 0.84 1.03 1.02  AST 25  --   --   --   --   --   --   --   --   ALT 25  --   --   --   --   --   --   --   --   ALKPHOS 43  --   --   --   --   --   --   --   --   BILITOT 0.9  --   --   --   --   --   --   --   --   ALBUMIN 3.3*  --   --   --   --   --   --   --   --  CRP  --   --   --   --   --  4.1* 4.4* 1.1* 0.6  PROCALCITON  --   --   --   --  0.39 0.35 0.17 <0.10  --   LATICACIDVEN  --  1.8  --  0.8  --   --   --   --   --   INR 1.1  --   --   --   --   --   --   --   --   BNP  --   --   --   --  74.8 94.5 149.1* 144.4*  --   MG  --   --   --   --   --   --  2.2 2.0 2.0  CALCIUM  8.7*  --   --   --   --  8.4* 8.9 8.9 8.8*      Recent Labs  Lab 11/27/23 0933 11/27/23 0959 11/27/23 1300 11/27/23 2109 11/28/23 0435 11/29/23 0516 11/30/23 0511 12/01/23 0442   CRP  --   --   --   --  4.1* 4.4* 1.1* 0.6  PROCALCITON  --   --   --  0.39 0.35 0.17 <0.10  --   LATICACIDVEN  --  1.8 0.8  --   --   --   --   --   INR 1.1  --   --   --   --   --   --   --   BNP  --   --   --  74.8 94.5 149.1* 144.4*  --   MG  --   --   --   --   --  2.2 2.0 2.0  CALCIUM  8.7*  --   --   --  8.4* 8.9 8.9 8.8*    --------------------------------------------------------------------------------------------------------------- Lab Results  Component Value Date   CHOL 151 06/25/2023   HDL 30 (L) 06/25/2023   LDLCALC 79 06/25/2023   TRIG 209 (H) 06/25/2023   CHOLHDL 5.0 06/25/2023    Lab Results  Component Value Date   HGBA1C 6.6 (H) 10/04/2023      Micro Results Recent Results (from the past 240 hours)  Resp panel by RT-PCR (RSV, Flu A&B, Covid) Anterior Nasal Swab     Status: Abnormal   Collection Time: 11/27/23  9:33 AM   Specimen: Anterior Nasal Swab  Result Value Ref Range Status   SARS Coronavirus 2 by RT PCR POSITIVE (A) NEGATIVE Final   Influenza A by PCR NEGATIVE NEGATIVE Final   Influenza B by PCR NEGATIVE NEGATIVE Final    Comment: (NOTE) The Xpert Xpress SARS-CoV-2/FLU/RSV plus assay is intended as an aid in the diagnosis of influenza from Nasopharyngeal swab specimens and should not be used as a sole basis for treatment. Nasal washings and aspirates are unacceptable for Xpert Xpress SARS-CoV-2/FLU/RSV testing.  Fact Sheet for Patients: BloggerCourse.com  Fact Sheet for Healthcare Providers: SeriousBroker.it  This test is not yet approved or cleared by the [place]  FDA and has been authorized for detection and/or diagnosis of SARS-CoV-2 by FDA under an Emergency Use Authorization (EUA). This EUA will remain in effect (meaning this test can be used) for the duration of the COVID-19 declaration under Section 564(b)(1) of the Act, 21 U.S.C. section 360bbb-3(b)(1), unless the authorization  is terminated or revoked.     Resp Syncytial Virus by PCR NEGATIVE NEGATIVE Final    Comment: (NOTE) Fact Sheet  for Patients: BloggerCourse.com  Fact Sheet for Healthcare Providers: SeriousBroker.it  This test is not yet approved or cleared by the [place]  FDA and has been authorized for detection and/or diagnosis of SARS-CoV-2 by FDA under an Emergency Use Authorization (EUA). This EUA will remain in effect (meaning this test can be used) for the duration of the COVID-19 declaration under Section 564(b)(1) of the Act, 21 U.S.C. section 360bbb-3(b)(1), unless the authorization is terminated or revoked.  Performed at [facility], [address] [place], [place] [address]   Blood Culture (routine x 2)     Status: Abnormal (Preliminary result)   Collection Time: 11/27/23  9:33 AM   Specimen: BLOOD  Result Value Ref Range Status   Specimen Description BLOOD LEFT ANTECUBITAL  Final   Special Requests   Final    BOTTLES DRAWN AEROBIC AND ANAEROBIC Blood Culture results may not be optimal due to an inadequate volume of blood received in culture bottles   Culture  Setup Time   Final    GRAM POSITIVE COCCI IN BOTH AEROBIC AND ANAEROBIC BOTTLES CRITICAL RESULT CALLED TO, READ BACK BY AND VERIFIED WITH: PHARMD GREG A [id number] FCP    Culture (A)  Final    ENTEROCOCCUS FAECIUM VANCOMYCIN  RESISTANT ENTEROCOCCUS Sent to [facility] for further susceptibility testing. Performed at [facility], [address] [place], [place] [address]    Report Status PENDING  Incomplete   Organism ID, Bacteria ENTEROCOCCUS FAECIUM  Final      Susceptibility   Enterococcus faecium - MIC*    AMPICILLIN  >=32 RESISTANT Resistant     VANCOMYCIN  >=32 RESISTANT Resistant     GENTAMICIN SYNERGY SENSITIVE Sensitive     * ENTEROCOCCUS FAECIUM  Blood Culture ID Panel (Reflexed)     Status: Abnormal   Collection Time: 11/27/23  9:33 AM   Result Value Ref Range Status   Enterococcus faecalis NOT DETECTED NOT DETECTED Final   Enterococcus Faecium DETECTED (A) NOT DETECTED Final    Comment: CRITICAL RESULT CALLED TO, READ BACK BY AND VERIFIED WITH: PHARMD GREG A [id number] FCP      Listeria monocytogenes NOT DETECTED NOT DETECTED Final   Staphylococcus species NOT DETECTED NOT DETECTED Final   Staphylococcus aureus (BCID) NOT DETECTED NOT DETECTED Final   Staphylococcus epidermidis NOT DETECTED NOT DETECTED Final   Staphylococcus lugdunensis NOT DETECTED NOT DETECTED Final   Streptococcus species NOT DETECTED NOT DETECTED Final   Streptococcus agalactiae NOT DETECTED NOT DETECTED Final   Streptococcus pneumoniae NOT DETECTED NOT DETECTED Final   Streptococcus pyogenes NOT DETECTED NOT DETECTED Final   A.calcoaceticus-baumannii NOT DETECTED NOT DETECTED Final   Bacteroides fragilis NOT DETECTED NOT DETECTED Final   Enterobacterales NOT DETECTED NOT DETECTED Final   Enterobacter cloacae complex NOT DETECTED NOT DETECTED Final   Escherichia coli NOT DETECTED NOT DETECTED Final   Klebsiella aerogenes NOT DETECTED NOT DETECTED Final   Klebsiella oxytoca NOT DETECTED NOT DETECTED Final   Klebsiella pneumoniae NOT DETECTED NOT DETECTED Final   Proteus species NOT DETECTED NOT DETECTED Final   Salmonella species NOT DETECTED NOT DETECTED Final   Serratia marcescens NOT DETECTED NOT DETECTED Final   Haemophilus influenzae NOT DETECTED NOT DETECTED Final   Neisseria meningitidis NOT DETECTED NOT DETECTED Final   Pseudomonas aeruginosa NOT DETECTED NOT DETECTED Final   Stenotrophomonas maltophilia NOT DETECTED NOT DETECTED Final   Candida albicans NOT DETECTED NOT DETECTED Final   Candida auris NOT DETECTED NOT DETECTED Final  Candida glabrata NOT DETECTED NOT DETECTED Final   Candida krusei NOT DETECTED NOT DETECTED Final   Candida parapsilosis NOT DETECTED NOT DETECTED Final   Candida tropicalis NOT DETECTED NOT DETECTED  Final   Cryptococcus neoformans/gattii NOT DETECTED NOT DETECTED Final   Vancomycin  resistance DETECTED (A) NOT DETECTED Final    Comment: CRITICAL RESULT CALLED TO, READ BACK BY AND VERIFIED WITH: Alycia Babcock A [id number] FCP   Performed at [facility], [address] [place], [place] [address]   Blood Culture (routine x 2)     Status: Abnormal   Collection Time: 11/27/23 10:23 AM   Specimen: BLOOD  Result Value Ref Range Status   Specimen Description BLOOD RIGHT ANTECUBITAL  Final   Special Requests   Final    BOTTLES DRAWN AEROBIC AND ANAEROBIC Blood Culture results may not be optimal due to an inadequate volume of blood received in culture bottles   Culture  Setup Time   Final    GRAM POSITIVE COCCI IN BOTH AEROBIC AND ANAEROBIC BOTTLES CRITICAL VALUE NOTED.  VALUE IS CONSISTENT WITH PREVIOUSLY REPORTED AND CALLED VALUE.    Culture (A)  Final    ENTEROCOCCUS FAECIUM SUSCEPTIBILITIES PERFORMED ON PREVIOUS CULTURE WITHIN THE LAST 5 DAYS. Performed at [facility], [address] [place], [place] [address]    Report Status 11/30/2023 FINAL  Final  MRSA Next Gen by PCR, Nasal     Status: None   Collection Time: 11/28/23  5:52 AM   Specimen: Nasal Mucosa; Nasal Swab  Result Value Ref Range Status   MRSA by PCR Next Gen NOT DETECTED NOT DETECTED Final    Comment: (NOTE) The GeneXpert MRSA Assay (FDA approved for NASAL specimens only), is one component of a comprehensive MRSA colonization surveillance program. It is not intended to diagnose MRSA infection nor to guide or monitor treatment for MRSA infections. Test performance is not FDA approved in patients less than 47 years old. Performed at [facility], [address], [place], [place] [address]   Culture, blood (Routine X 2) w Reflex to ID Panel     Status: None (Preliminary result)   Collection Time: 11/29/23  5:16 AM   Specimen: BLOOD LEFT ARM  Result Value Ref Range Status   Specimen Description  BLOOD LEFT ARM  Final   Special Requests   Final    BOTTLES DRAWN AEROBIC AND ANAEROBIC Blood Culture adequate volume   Culture   Final    NO GROWTH 1 DAY Performed at [facility], [address], [place], [place] [address]    Report Status PENDING  Incomplete  Culture, blood (Routine X 2) w Reflex to ID Panel     Status: None (Preliminary result)   Collection Time: 11/29/23  5:16 AM   Specimen: BLOOD RIGHT HAND  Result Value Ref Range Status   Specimen Description BLOOD RIGHT HAND  Final   Special Requests   Final    BOTTLES DRAWN AEROBIC AND ANAEROBIC Blood Culture adequate volume   Culture   Final    NO GROWTH 1 DAY Performed at [facility], [address], [place], [place] [address]    Report Status PENDING  Incomplete    Radiology Report ECHOCARDIOGRAM COMPLETE Result Date: 11/29/2023    ECHOCARDIOGRAM REPORT   Patient Name:   COHAN STIPES Date of Exam: 11/29/2023 Medical Rec #:  [id number]        Height:  74.0 in Accession #:    [id number]       Weight:       354.7 lb Date of Birth:  July 30, 1956        BSA:          2.773 m Patient Age:    67 years         BP:           112/54 mmHg Patient Gender: M                HR:           53 bpm. Exam Location:  Inpatient Procedure: 2D Echo, Cardiac Doppler and Color Doppler (Both Spectral and Color            Flow Doppler were utilized during procedure). Indications:    Endocarditis  History:        Patient has prior history of Echocardiogram examinations, most                 recent 06/26/2023. CHF, CAD, COPD, Aortic Valve Disease; Risk                 Factors:Sleep Apnea, Diabetes and Hypertension.                 Aortic Valve: 34 Medtronic CoreValve-Evolut Pro prosthetic,                 stented (TAVR) valve is present in the aortic position.  Sonographer:    Astrid Blamer Referring Phys: Gibson Kurtz, N IMPRESSIONS  1. Left ventricular ejection fraction, by estimation, is 60 to 65%. The left ventricle has normal  function. The left ventricle has no regional wall motion abnormalities. Left ventricular diastolic function could not be evaluated.  2. Right ventricular systolic function is normal. The right ventricular size is normal. Tricuspid regurgitation signal is inadequate for assessing PA pressure.  3. Moderate pericardial effusion. The pericardial effusion is circumferential.  4. The mitral valve is normal in structure. No evidence of mitral valve regurgitation. No evidence of mitral stenosis.  5. The aortic valve is normal in structure. Aortic valve regurgitation is not visualized. No aortic stenosis is present. There is a 34 Medtronic CoreValve-Evolut Pro prosthetic (TAVR) valve present in the aortic position. Aortic valve area, by VTI measures 1.58 cm. Aortic valve mean gradient measures 19.0 mmHg. Aortic valve Vmax measures 2.63 m/s.  6. The inferior vena cava is dilated in size with <50% respiratory variability, suggesting right atrial pressure of 15 mmHg. Conclusion(s)/Recommendation(s): Images are not adequate to rule out vegetation. Recommend TEE for further evaluation. FINDINGS  Left Ventricle: Left ventricular ejection fraction, by estimation, is 60 to 65%. The left ventricle has normal function. The left ventricle has no regional wall motion abnormalities. The left ventricular internal cavity size was normal in size. There is  no left ventricular hypertrophy. Left ventricular diastolic function could not be evaluated. Right Ventricle: The right ventricular size is normal. No increase in right ventricular wall thickness. Right ventricular systolic function is normal. Tricuspid regurgitation signal is inadequate for assessing PA pressure. Left Atrium: Left atrial size was normal in size. Right Atrium: Right atrial size was normal in size. Pericardium: A moderately sized pericardial effusion is present. The pericardial effusion is circumferential. Mitral Valve: The mitral valve is normal in structure. Mild mitral  annular calcification. No evidence of mitral valve regurgitation. No evidence of mitral valve stenosis. Tricuspid Valve: The tricuspid valve is normal in structure. Tricuspid valve  regurgitation is not demonstrated. No evidence of tricuspid stenosis. Aortic Valve: The aortic valve is normal in structure. Aortic valve regurgitation is not visualized. No aortic stenosis is present. Aortic valve mean gradient measures 19.0 mmHg. Aortic valve peak gradient measures 27.7 mmHg. Aortic valve area, by VTI measures 1.58 cm. There is a 34 Medtronic CoreValve-Evolut Pro prosthetic, stented (TAVR) valve present in the aortic position. Pulmonic Valve: The pulmonic valve was normal in structure. Pulmonic valve regurgitation is not visualized. No evidence of pulmonic stenosis. Aorta: The aortic root is normal in size and structure. Venous: The inferior vena cava is dilated in size with less than 50% respiratory variability, suggesting right atrial pressure of 15 mmHg. IAS/Shunts: No atrial level shunt detected by color flow Doppler.  LEFT VENTRICLE PLAX 2D LVIDd:         4.90 cm LVIDs:         3.60 cm LV PW:         1.00 cm LV IVS:        1.20 cm LVOT diam:     1.80 cm LV SV:         97 LV SV Index:   35 LVOT Area:     2.54 cm  RIGHT VENTRICLE RV S prime:     15.70 cm/s TAPSE (M-mode): 2.9 cm LEFT ATRIUM              Index        RIGHT ATRIUM           Index LA Vol (A2C):   104.0 ml 37.51 ml/m  RA Area:     21.20 cm LA Vol (A4C):   59.0 ml  21.28 ml/m  RA Volume:   62.00 ml  22.36 ml/m LA Biplane Vol: 85.5 ml  30.84 ml/m  AORTIC VALVE AV Area (Vmax):    1.34 cm AV Area (Vmean):   1.39 cm AV Area (VTI):     1.58 cm AV Vmax:           263.00 cm/s AV Vmean:          212.000 cm/s AV VTI:            0.617 m AV Peak Grad:      27.7 mmHg AV Mean Grad:      19.0 mmHg LVOT Vmax:         138.00 cm/s LVOT Vmean:        116.000 cm/s LVOT VTI:          0.382 m LVOT/AV VTI ratio: 0.62  SHUNTS Systemic VTI:  0.38 m Systemic Diam: 1.80  cm Gaylyn Keas MD Electronically signed by Gaylyn Keas MD Signature Date/Time: 11/29/2023/4:10:14 PM    Final      Signature  -   Lynnwood Sauer M.D on 12/01/2023 at 8:06 AM   -  To page go to [email or web address]

## 2023-12-01 NOTE — Progress Notes (Signed)
 Physical Therapy Treatment Patient Details Name: Lawrence [name] MRN: [id number] DOB: January 21, 1956 Today's Date: 12/01/2023   History of Present Illness Pt is a 68 y/o M admitted on 11/27/23 after presenting with c/o unwitnessed fall out of w/c. Pt tested positive for COVID-19. Pt also found to have acute hypotension. Of note, pt with recent admission 3/14-3/19/25 for ARF with hypoxia 2/2 Flu A, PNA & COPD exacerbation. PMH: COPD, aortic stenosis s/p TAVR July 2024, seizure disorder, chronic diastolic CHF, a-fib on Eliquis , CVA, gout, anxiety, BPH, chronic venous statis, morbid obesity, depression, neuropathy, HTN, PTSD, DM2    PT Comments  Eager to participate with therapy this afternoon. Tolerated bed mobility training with CGA-Min assist. Mod assist to stand from highly elevated bed surface using steady x2. Multiple stands from perched position on [place]. Requires Mod assist and bil knee block from device. Very effortful for pt, and knees buckle easily. Hoyer pad not in room. Will request new one to be delivered for recommended OOB to chair daily. Patient will continue to benefit from skilled physical therapy services to further improve independence with functional mobility. Patient will benefit from continued inpatient follow up therapy, <3 hours/day     If plan is discharge home, recommend the following: Two people to help with walking and/or transfers;Two people to help with bathing/dressing/bathroom   Can travel by private vehicle     No  Equipment Recommendations  Other (comment) (defer to next venue)    Recommendations for Other Services       Precautions / Restrictions Precautions Precautions: Fall Restrictions Weight Bearing Restrictions Per Provider Order: No     Mobility  Bed Mobility Overal bed mobility: Needs Assistance Bed Mobility: Supine to Sit, Sit to Supine     Supine to sit: HOB elevated, Used rails, Min assist Sit to supine: Contact guard assist   General bed  mobility comments: Min assist for patient to pull through therapist's hand when rising to EOB, cues for technique. CGA to return to supine.    Transfers Overall transfer level: Needs assistance Equipment used: Ambulation equipment used Transfers: Sit to/from Stand Sit to Stand: Mod assist, Via lift equipment, From elevated surface           General transfer comment: Completed sit to stand x2 from bed with [place] and bed highly elevated. Performed x3 from perched position on [place]. Each attempt with success for fully upright stance. Tolerated <30 seconds each. LEs do buckle.  Max cues for technique throughout. Transfer via Lift Equipment: Stedy  Ambulation/Gait                   Stairs             Wheelchair Mobility     Tilt Bed    Modified Rankin (Stroke Patients Only)       Balance Overall balance assessment: Needs assistance Sitting-balance support: Feet supported, No upper extremity supported Sitting balance-Leahy Scale: Fair     Standing balance support: During functional activity, Bilateral upper extremity supported, Reliant on assistive device for balance Standing balance-Leahy Scale: Zero Standing balance comment: Stedy for knee block bil                            Communication Communication Communication: No apparent difficulties  Cognition Arousal: Alert Behavior During Therapy: WFL for tasks assessed/performed   PT - Cognitive impairments: No apparent impairments  Following commands: Intact      Cueing Cueing Techniques: Verbal cues  Exercises      General Comments General comments (skin integrity, edema, etc.): Poor signal on O2 sensor but brief read indicated 100% on RA.      Pertinent Vitals/Pain Pain Assessment Pain Assessment: No/denies pain    Home Living                          Prior Function            PT Goals (current goals can now be found in the care  plan section) Acute Rehab PT Goals Patient Stated Goal: get stronger, walk again PT Goal Formulation: With patient Time For Goal Achievement: 12/12/23 Potential to Achieve Goals: Poor Progress towards PT goals: Progressing toward goals    Frequency    Min 2X/week      PT Plan      Co-evaluation              AM-PAC PT "6 Clicks" Mobility   Outcome Measure  Help needed turning from your back to your side while in a flat bed without using bedrails?: A Little Help needed moving from lying on your back to sitting on the side of a flat bed without using bedrails?: A Little Help needed moving to and from a bed to a chair (including a wheelchair)?: Total Help needed standing up from a chair using your arms (e.g., wheelchair or bedside chair)?: Total Help needed to walk in hospital room?: Total Help needed climbing 3-5 steps with a railing? : Total 6 Click Score: 10    End of Session Equipment Utilized During Treatment: Gait belt Activity Tolerance: Patient tolerated treatment well Patient left: with call bell/phone within reach;in bed;with bed alarm set (Bed in chair mode.)   PT Visit Diagnosis: Muscle weakness (generalized) (M62.81);History of falling (Z91.81);Difficulty in walking, not elsewhere classified (R26.2);Unsteadiness on feet (R26.81)     Time: 1245-1315 PT Time Calculation (min) (ACUTE ONLY): 30 min  Charges:    $Therapeutic Activity: 23-37 mins PT General Charges $$ ACUTE PT VISIT: 1 Visit                     Jory Ng, PT, DPT [facility]  Rehabilitation Services Physical Therapist Office: [phone] Website: [email or web address]    Alinda Irani 12/01/2023, 1:41 PM

## 2023-12-02 DIAGNOSIS — U071 COVID-19: Secondary | ICD-10-CM | POA: Diagnosis not present

## 2023-12-02 LAB — CBC WITH DIFFERENTIAL/PLATELET
Abs Immature Granulocytes: 0.06 10*3/uL (ref 0.00–0.07)
Basophils Absolute: 0 10*3/uL (ref 0.0–0.1)
Basophils Relative: 1 %
Eosinophils Absolute: 0.4 10*3/uL (ref 0.0–0.5)
Eosinophils Relative: 7 %
HCT: 39.9 % (ref 39.0–52.0)
Hemoglobin: 13.3 g/dL (ref 13.0–17.0)
Immature Granulocytes: 1 %
Lymphocytes Relative: 28 %
Lymphs Abs: 1.6 10*3/uL (ref 0.7–4.0)
MCH: 30.3 pg (ref 26.0–34.0)
MCHC: 33.3 g/dL (ref 30.0–36.0)
MCV: 90.9 fL (ref 80.0–100.0)
Monocytes Absolute: 0.6 10*3/uL (ref 0.1–1.0)
Monocytes Relative: 11 %
Neutro Abs: 3 10*3/uL (ref 1.7–7.7)
Neutrophils Relative %: 52 %
Platelets: 184 10*3/uL (ref 150–400)
RBC: 4.39 MIL/uL (ref 4.22–5.81)
RDW: 13.3 % (ref 11.5–15.5)
WBC: 5.8 10*3/uL (ref 4.0–10.5)
nRBC: 0 % (ref 0.0–0.2)

## 2023-12-02 LAB — GLUCOSE, CAPILLARY
Glucose-Capillary: 165 mg/dL — ABNORMAL HIGH (ref 70–99)
Glucose-Capillary: 125 mg/dL — ABNORMAL HIGH (ref 70–99)
Glucose-Capillary: 204 mg/dL — ABNORMAL HIGH (ref 70–99)
Glucose-Capillary: 195 mg/dL — ABNORMAL HIGH (ref 70–99)

## 2023-12-02 LAB — C-REACTIVE PROTEIN: CRP: 0.5 mg/dL (ref <1.0)

## 2023-12-02 LAB — MAGNESIUM: Magnesium: 2 mg/dL (ref 1.7–2.4)

## 2023-12-02 LAB — BASIC METABOLIC PANEL WITH GFR
Anion gap: 9 (ref 5–15)
BUN: 16 mg/dL (ref 8–23)
CO2: 28 mmol/L (ref 22–32)
Calcium: 8.7 mg/dL — ABNORMAL LOW (ref 8.9–10.3)
Chloride: 104 mmol/L (ref 98–111)
Creatinine, Ser: 1 mg/dL (ref 0.61–1.24)
GFR, Estimated: >60 mL/min (ref >60)
Glucose, Bld: 85 mg/dL (ref 70–99)
Potassium: 3.6 mmol/L (ref 3.5–5.1)
Sodium: 141 mmol/L (ref 135–145)

## 2023-12-02 LAB — PROCALCITONIN: Procalcitonin: <0.1 ng/mL

## 2023-12-02 LAB — BRAIN NATRIURETIC PEPTIDE: B Natriuretic Peptide: 64.1 pg/mL (ref 0.0–100.0)

## 2023-12-02 MED ORDER — MIDODRINE HCL 5 MG PO TABS
5.0000 mg | ORAL_TABLET | Freq: Three times a day (TID) | ORAL | Status: DC
  Administered 2023-12-02: 5 mg via ORAL

## 2023-12-02 MED ORDER — MIDODRINE HCL 5 MG PO TABS
5.0000 mg | ORAL_TABLET | Freq: Once | ORAL | Status: AC
  Administered 2023-12-02: 5 mg via ORAL

## 2023-12-02 NOTE — Plan of Care (Signed)

## 2023-12-02 NOTE — Plan of Care (Signed)
  Problem: Coping: Goal: Ability to adjust to condition or change in health will improve Outcome: Progressing   Problem: Clinical Measurements: Goal: Will remain free from infection Outcome: Progressing Goal: Respiratory complications will improve Outcome: Progressing   Problem: Coping: Goal: Level of anxiety will decrease Outcome: Progressing   Problem: Skin Integrity: Goal: Risk for impaired skin integrity will decrease Outcome: Progressing

## 2023-12-02 NOTE — Progress Notes (Signed)
 PROGRESS NOTE                                                                                                                                                                                                             Patient Demographics:    Lawrence [name], is a 68 y.o. male, DOB - [birth year], YQM:[id number]  Outpatient Primary MD for the patient is Darnelle Elders, PA-C    LOS - 4  Admit date - 11/27/2023    Chief Complaint  Patient presents with   Level 2 FOT   Code Sepsis       Brief Narrative (HPI from H&P)    68 y.o. male with medical history significant of COPD, severe aortic stenosis status post TAVR in July 2024, seizure disorder, chronic diastolic CHF (follows Dr. Mitzie Anda), atrial fibrillation on Eliquis , CVA, gout, OSA on nighttime CPAP, anxiety, BPH, chronic venous stasis, and morbid obesity presents after having an unwitnessed fall out of his wheelchair.   Records note patient had just recently been hospitalized 3/14-3/19 for sepsis with acute respiratory failure with hypoxia secondary to influenza A with pneumonia and COPD exacerbation.   The patient, who normally uses a wheelchair for mobility, experienced a fall after leaning forward attempting to pick something off the floor with his pick-up stick and hit his head.  Denied any loss of consciousness.  He was found to be febrile was COVID-19 positive, his only complaint was right foot pain, subsequently his blood cultures came back positive for gram-positive cocci 2 out of 2 when he was admitted.   Subjective:   Patient in bed, appears comfortable, denies any headache, no fever, no chest pain or pressure, no shortness of breath , no abdominal pain. No new focal weakness.    Assessment  & Plan :   Sepsis, Incidental COVID-19 infection.  2 out of 2 blood culture positive for Enterococcus.  Source unclear, CT chest abdomen pelvis nonacute, his only complaint is  ongoing pain in his right foot which could be due to underlying gout, x-ray right foot, currently on IV Daptomycin  >> Ampicillin  12/01/23, ID following, appears nontoxic.  Continue supportive care with IV fluids and midodrine  for blood pressure, TTE stable, TEE after he finishes his COVID isolation.  Has underlying history of MR.  Monitor.   Status post TAVR No acute issues noted per  last echocardiogram from 06/2023, kindly see echo above.  HX of gout, ongoing right foot pain for the last few weeks, twitching in the right leg.  Has history of gout and seizures, stable EEG, stable uric acid level stable, hold allopurinol , continue colchicine , even a trial of IV steroid on 11/28/2023, repeat on 11/29/2023, right foot pain and twitching considerably improved.  X-ray of the right foot nonacute.  Incidental 19 infection.  Acute but appears to be incidental.  No cough or shortness of breath, inflammatory markers stable, not a candidate for Paxlovid due to Eliquis .  Continue to monitor.   Hypotension Acute.  Initial blood pressures noted to be as low as 77/38.  Suspect secondary to acute infection.  Blood pressures improved with IV fluids. Blood pressure has stabilized on midodrine , challenge with low-dose diuretics and gentle down titration of midodrine .   Fall out of wheelchair - CT scan of the head and cervical spine did not note any acute abnormality and chronic changes as previously noted on previous MRI imaging from 3/3.  PT/OT to evaluate and treat.   COPD, without acute exacerbation at this time  Patient does not appear to have active wheezing on physical exam although decreased overall aeration. Albuterol  nebs as needed for shortness of breath/wheezing.   Paroxysmal atrial fibrillation, [place] vasc 2 score of greater than 3 on chronic anticoagulation  Patient appears to be in sinus rhythm at this time. Continue Eliquis .  Not on rate controlling agents.   Chr. Diastolic congestive heart failure - has  mild fluid overload which is chronic, blood pressure too low holding diuretics.  Seizure disorder  - Continue Keppra  and lacosamide , has been having twitching in his right lower extremity for a few days, unremarkable EEG.   Hyperlipidemia  - Continue fenofibrate  and atorvastatin    Anxiety and depression - Continue trazodone , mirtazapine , buspirone , venlafaxine , and aripiprazole    Morbid obesity - OSA on CPAP  BMI 46.22 kg/m, follow-up with PCP weight loss, continue nighttime CPAP.   Diabetes mellitus type 2, with long-term use of insulin  -  Patient is on sliding scale insulin , Prandin , and Mounjaro .  Continue Prandin  & SSI  Lab Results  Component Value Date   HGBA1C 6.6 (H) 10/04/2023   CBG (last 3)  Recent Labs    12/01/23 1719 12/01/23 2123 12/02/23 0819  GLUCAP 146* 151* 165*          Condition - Extremely Guarded  Family Communication  :  called [name] [phone] 11/28/2023 at 10 AM message left, updated 12/02/23  Code Status :  DNR  Consults  :  ID, PCCM  PUD Prophylaxis :     Procedures  :     TEE   TTE 1. Left ventricular ejection fraction, by estimation, is 60 to 65%. The left ventricle has normal function. The left ventricle has no regional wall motion abnormalities. Left ventricular diastolic function could not be evaluated.  2. Right ventricular systolic function is normal. The right ventricular size is normal. Tricuspid regurgitation signal is inadequate for assessing PA pressure.  3. Moderate pericardial effusion. The pericardial effusion is circumferential.  4. The mitral valve is normal in structure. No evidence of mitral valve regurgitation. No evidence of mitral stenosis.  5. The aortic valve is normal in structure. Aortic valve regurgitation is not visualized. No aortic stenosis is present. There is a 34 Medtronic CoreValve-Evolut Pro prosthetic (TAVR) valve present in the aortic position. Aortic valve area, by VTI measures 1.58 cm. Aortic valve mean  gradient  measures 19.0 mmHg. Aortic valve Vmax measures 2.63 m/s.  6. The inferior vena cava is dilated in size with <50% respiratory variability, suggesting right atrial pressure of 15 mmHg. Conclusion(s)/Recommendation(s): Images are not adequate to rule out vegetation. Recommend TEE for further evaluation.  EEG - non acute  CT head.  C-spine.    CT head: 1.  No evidence of an acute intracranial abnormality. 2. Forehead hematoma on the right. 3. Cerebral atrophy. 4. Paranasal sinus disease as described.   CT cervical spine: 1. No evidence of an acute cervical spine fracture. 2. Grade 1 anterolisthesis at C2-C3, C3-C4 and C4-C5. 3. Levocurvature of the cervical spine. 4. Cervical spondylosis as described within the body of the report. Multifactorial severe spinal canal stenosis at C3-C4 (as was demonstrated on the recent prior cervical spine MRI of 10/09/2023). Multilevel bony neural foraminal narrowing. 5. Erosions noted along the left C3-C4 facet joint, a finding which can be seen in setting of CPPD arthropathy. 6. 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  CT Chest, abdomen and pelvis-  1. No acute findings. 2. Aortic Atherosclerosis (ICD10-I70.0) and Emphysema (ICD10-J43.9). Coronary and systemic atherosclerosis. 3. Sigmoid colon diverticulosis. 4. Gluteal and upper thigh muscular atrophy with the exception of the hip adductor musculature. There is some paraspinal muscular atrophy and atrophy of the latissimus dorsi muscles. 5. Dorsal stimulator device noted electrodes at the T12-L1 level.      Disposition Plan  :    Status is: Inpatient   DVT Prophylaxis  :    Place TED hose Start: 11/28/23 1000 apixaban  (ELIQUIS ) tablet 5 mg     Lab Results  Component Value Date   PLT 184 12/02/2023    Diet :  Diet Order             Diet heart healthy/carb modified Room service appropriate? Yes; Fluid consistency: Thin  Diet effective now                    Inpatient  Medications  Scheduled Meds:  apixaban   5 mg Oral BID   ARIPiprazole   5 mg Oral QHS   busPIRone   10 mg Oral TID   colchicine   0.6 mg Oral BID   cyanocobalamin   1,000 mcg Oral Daily   fenofibrate   160 mg Oral Daily   fluticasone   2 spray Each Nare Daily   guaiFENesin   600 mg Oral BID   insulin  aspart  0-5 Units Subcutaneous QHS   insulin  aspart  0-9 Units Subcutaneous TID WC   lacosamide   150 mg Oral BID   levETIRAcetam   500 mg Oral BID   midodrine   10 mg Oral TID WC   mirtazapine   15 mg Oral QHS   prazosin   2 mg Oral QHS   pregabalin   150 mg Oral TID   repaglinide   2 mg Oral BID   rOPINIRole   0.25 mg Oral BID   sodium chloride  flush  3 mL Intravenous Q12H   spironolactone   50 mg Oral Daily   torsemide   20 mg Oral Daily   traZODone   200 mg Oral QHS   umeclidinium bromide   1 puff Inhalation Daily   venlafaxine  XR  150 mg Oral Q breakfast   Continuous Infusions:  ampicillin  (OMNIPEN) IV 2 g (12/02/23 0835)   DAPTOmycin  Stopped (12/01/23 2145)   PRN Meds:.acetaminophen  **OR** acetaminophen , albuterol , bisacodyl , HYDROcodone -acetaminophen , Muscle Rub, ondansetron  **OR** ondansetron  (ZOFRAN ) IV     Objective:   Vitals:   12/01/23 1610 12/02/23 0345 12/02/23 0411  12/02/23 0600  BP: 111/71 101/72    Pulse: 73 65    Resp: 16 16    Temp: 97.6 F (36.4 C) 97.7 F (36.5 C)    TempSrc: Oral Oral    SpO2: 94% 94%  93%  Weight:   (!) 159.3 kg   Height:        Wt Readings from Last 3 Encounters:  12/02/23 (!) 159.3 kg  10/25/23 (!) 150 kg  10/10/23 (!) 157 kg     Intake/Output Summary (Last 24 hours) at 12/02/2023 0954 Last data filed at 12/02/2023 0610 Gross per 24 hour  Intake 3 ml  Output 6550 ml  Net -6547 ml     Physical Exam  Awake Alert, No new F.N deficits, Normal affect [place].AT,PERRAL Supple Neck, No JVD,   Symmetrical Chest wall movement, Good air movement bilaterally, CTAB RRR,No Gallops,Rubs or new Murmurs,  +ve B.Sounds, Abd Soft, No tenderness,    Trace edema, mild R foot tenderness, bilateral first MTP bunions    Data Review:    Recent Labs  Lab 11/27/23 0933 11/27/23 1054 11/28/23 0435 11/29/23 0516 11/30/23 0511 12/01/23 0442 12/02/23 0516  WBC 8.1  --  6.7 4.6 4.9 4.9 5.8  HGB 13.6   < > 12.3* 12.0* 12.2* 12.6* 13.3  HCT 40.5   < > 37.2* 35.9* 37.1* 38.1* 39.9  PLT 184  --  153 185 182 195 184  MCV 91.4  --  91.0 89.5 90.7 91.1 90.9  MCH 30.7  --  30.1 29.9 29.8 30.1 30.3  MCHC 33.6  --  33.1 33.4 32.9 33.1 33.3  RDW 13.8  --  14.0 13.2 13.2 13.3 13.3  LYMPHSABS 0.6*  --   --  0.8 1.5 1.5 1.6  MONOABS 0.6  --   --  0.5 0.6 0.5 0.6  EOSABS 0.1  --   --  0.0 0.1 0.2 0.4  BASOSABS 0.0  --   --  0.0 0.0 0.0 0.0   < > = values in this interval not displayed.    Recent Labs  Lab 11/27/23 0933 11/27/23 0959 11/27/23 1054 11/27/23 1300 11/27/23 2109 11/28/23 0435 11/29/23 0516 11/30/23 0511 12/01/23 0442 12/02/23 0516  NA 137  --    < >  --   --  137 140 141 141 141  K 3.7  --    < >  --   --  3.2* 3.8 3.7 3.3* 3.6  CL 101  --    < >  --   --  104 108 106 104 104  CO2 26  --   --   --   --  26 24 26 26 28   ANIONGAP 10  --   --   --   --  7 8 9 11 9   GLUCOSE 196*  --    < >  --   --  97 129* 100* 76 85  BUN 14  --    < >  --   --  13 15 18 17 16   CREATININE 1.12  --    < >  --   --  1.05 0.84 1.03 1.02 1.00  AST 25  --   --   --   --   --   --   --   --   --   ALT 25  --   --   --   --   --   --   --   --   --  ALKPHOS 43  --   --   --   --   --   --   --   --   --   BILITOT 0.9  --   --   --   --   --   --   --   --   --   ALBUMIN 3.3*  --   --   --   --   --   --   --   --   --   CRP  --   --   --   --   --  4.1* 4.4* 1.1* 0.6 0.5  PROCALCITON  --   --   --   --    < > 0.35 0.17 <0.10 <0.10 <0.10  LATICACIDVEN  --  1.8  --  0.8  --   --   --   --   --   --   INR 1.1  --   --   --   --   --   --   --   --   --   BNP  --   --   --   --    < > 94.5 149.1* 144.4* 96.6 64.1  MG  --   --   --   --   --   --   2.2 2.0 2.0 2.0  CALCIUM  8.7*  --   --   --   --  8.4* 8.9 8.9 8.8* 8.7*   < > = values in this interval not displayed.      Recent Labs  Lab 11/27/23 0933 11/27/23 0959 11/27/23 1300 11/27/23 2109 11/28/23 0435 11/29/23 0516 11/30/23 0511 12/01/23 0442 12/02/23 0516  CRP  --   --   --   --  4.1* 4.4* 1.1* 0.6 0.5  PROCALCITON  --   --   --    < > 0.35 0.17 <0.10 <0.10 <0.10  LATICACIDVEN  --  1.8 0.8  --   --   --   --   --   --   INR 1.1  --   --   --   --   --   --   --   --   BNP  --   --   --    < > 94.5 149.1* 144.4* 96.6 64.1  MG  --   --   --   --   --  2.2 2.0 2.0 2.0  CALCIUM  8.7*  --   --   --  8.4* 8.9 8.9 8.8* 8.7*   < > = values in this interval not displayed.    --------------------------------------------------------------------------------------------------------------- Lab Results  Component Value Date   CHOL 151 06/25/2023   HDL 30 (L) 06/25/2023   LDLCALC 79 06/25/2023   TRIG 209 (H) 06/25/2023   CHOLHDL 5.0 06/25/2023    Lab Results  Component Value Date   HGBA1C 6.6 (H) 10/04/2023      Micro Results Recent Results (from the past 240 hours)  Resp panel by RT-PCR (RSV, Flu A&B, Covid) Anterior Nasal Swab     Status: Abnormal   Collection Time: 11/27/23  9:33 AM   Specimen: Anterior Nasal Swab  Result Value Ref Range Status   SARS Coronavirus 2 by RT PCR POSITIVE (A) NEGATIVE Final   Influenza A by PCR NEGATIVE NEGATIVE Final   Influenza B by PCR NEGATIVE NEGATIVE Final    Comment: (NOTE) The Xpert Xpress  SARS-CoV-2/FLU/RSV plus assay is intended as an aid in the diagnosis of influenza from Nasopharyngeal swab specimens and should not be used as a sole basis for treatment. Nasal washings and aspirates are unacceptable for Xpert Xpress SARS-CoV-2/FLU/RSV testing.  Fact Sheet for Patients: BloggerCourse.com  Fact Sheet for Healthcare Providers: SeriousBroker.it  This test is not yet  approved or cleared by the [place]  FDA and has been authorized for detection and/or diagnosis of SARS-CoV-2 by FDA under an Emergency Use Authorization (EUA). This EUA will remain in effect (meaning this test can be used) for the duration of the COVID-19 declaration under Section 564(b)(1) of the Act, 21 U.S.C. section 360bbb-3(b)(1), unless the authorization is terminated or revoked.     Resp Syncytial Virus by PCR NEGATIVE NEGATIVE Final    Comment: (NOTE) Fact Sheet for Patients: BloggerCourse.com  Fact Sheet for Healthcare Providers: SeriousBroker.it  This test is not yet approved or cleared by the [place]  FDA and has been authorized for detection and/or diagnosis of SARS-CoV-2 by FDA under an Emergency Use Authorization (EUA). This EUA will remain in effect (meaning this test can be used) for the duration of the COVID-19 declaration under Section 564(b)(1) of the Act, 21 U.S.C. section 360bbb-3(b)(1), unless the authorization is terminated or revoked.  Performed at [facility], [address], [place], [place] [address]   Blood Culture (routine x 2)     Status: Abnormal (Preliminary result)   Collection Time: 11/27/23  9:33 AM   Specimen: BLOOD  Result Value Ref Range Status   Specimen Description BLOOD LEFT ANTECUBITAL  Final   Special Requests   Final    BOTTLES DRAWN AEROBIC AND ANAEROBIC Blood Culture results may not be optimal due to an inadequate volume of blood received in culture bottles   Culture  Setup Time   Final    GRAM POSITIVE COCCI IN BOTH AEROBIC AND ANAEROBIC BOTTLES CRITICAL RESULT CALLED TO, READ BACK BY AND VERIFIED WITH: PHARMD GREG A [id number] FCP    Culture (A)  Final    ENTEROCOCCUS FAECIUM VANCOMYCIN  RESISTANT ENTEROCOCCUS Sent to [facility] for further susceptibility testing. Performed at [facility], [address], [place], [place] [address]    Report Status  PENDING  Incomplete   Organism ID, Bacteria ENTEROCOCCUS FAECIUM  Final      Susceptibility   Enterococcus faecium - MIC*    AMPICILLIN  >=32 RESISTANT Resistant     VANCOMYCIN  >=32 RESISTANT Resistant     GENTAMICIN SYNERGY SENSITIVE Sensitive     * ENTEROCOCCUS FAECIUM  Blood Culture ID Panel (Reflexed)     Status: Abnormal   Collection Time: 11/27/23  9:33 AM  Result Value Ref Range Status   Enterococcus faecalis NOT DETECTED NOT DETECTED Final   Enterococcus Faecium DETECTED (A) NOT DETECTED Final    Comment: CRITICAL RESULT CALLED TO, READ BACK BY AND VERIFIED WITH: PHARMD GREG A [id number] FCP      Listeria monocytogenes NOT DETECTED NOT DETECTED Final   Staphylococcus species NOT DETECTED NOT DETECTED Final   Staphylococcus aureus (BCID) NOT DETECTED NOT DETECTED Final   Staphylococcus epidermidis NOT DETECTED NOT DETECTED Final   Staphylococcus lugdunensis NOT DETECTED NOT DETECTED Final   Streptococcus species NOT DETECTED NOT DETECTED Final   Streptococcus agalactiae NOT DETECTED NOT DETECTED Final   Streptococcus pneumoniae NOT DETECTED NOT DETECTED Final   Streptococcus pyogenes NOT DETECTED NOT DETECTED Final   A.calcoaceticus-baumannii NOT DETECTED NOT DETECTED Final   Bacteroides fragilis  NOT DETECTED NOT DETECTED Final   Enterobacterales NOT DETECTED NOT DETECTED Final   Enterobacter cloacae complex NOT DETECTED NOT DETECTED Final   Escherichia coli NOT DETECTED NOT DETECTED Final   Klebsiella aerogenes NOT DETECTED NOT DETECTED Final   Klebsiella oxytoca NOT DETECTED NOT DETECTED Final   Klebsiella pneumoniae NOT DETECTED NOT DETECTED Final   Proteus species NOT DETECTED NOT DETECTED Final   Salmonella species NOT DETECTED NOT DETECTED Final   Serratia marcescens NOT DETECTED NOT DETECTED Final   Haemophilus influenzae NOT DETECTED NOT DETECTED Final   Neisseria meningitidis NOT DETECTED NOT DETECTED Final   Pseudomonas aeruginosa NOT DETECTED NOT DETECTED Final    Stenotrophomonas maltophilia NOT DETECTED NOT DETECTED Final   Candida albicans NOT DETECTED NOT DETECTED Final   Candida auris NOT DETECTED NOT DETECTED Final   Candida glabrata NOT DETECTED NOT DETECTED Final   Candida krusei NOT DETECTED NOT DETECTED Final   Candida parapsilosis NOT DETECTED NOT DETECTED Final   Candida tropicalis NOT DETECTED NOT DETECTED Final   Cryptococcus neoformans/gattii NOT DETECTED NOT DETECTED Final   Vancomycin  resistance DETECTED (A) NOT DETECTED Final    Comment: CRITICAL RESULT CALLED TO, READ BACK BY AND VERIFIED WITH: Alycia Babcock A [id number] FCP   Performed at [facility], [address], [place], [place] [address]   Blood Culture (routine x 2)     Status: Abnormal   Collection Time: 11/27/23 10:23 AM   Specimen: BLOOD  Result Value Ref Range Status   Specimen Description BLOOD RIGHT ANTECUBITAL  Final   Special Requests   Final    BOTTLES DRAWN AEROBIC AND ANAEROBIC Blood Culture results may not be optimal due to an inadequate volume of blood received in culture bottles   Culture  Setup Time   Final    GRAM POSITIVE COCCI IN BOTH AEROBIC AND ANAEROBIC BOTTLES CRITICAL VALUE NOTED.  VALUE IS CONSISTENT WITH PREVIOUSLY REPORTED AND CALLED VALUE.    Culture (A)  Final    ENTEROCOCCUS FAECIUM SUSCEPTIBILITIES PERFORMED ON PREVIOUS CULTURE WITHIN THE LAST 5 DAYS. Performed at [facility], [address] [place], [place] [address]    Report Status 11/30/2023 FINAL  Final  MRSA Next Gen by PCR, Nasal     Status: None   Collection Time: 11/28/23  5:52 AM   Specimen: Nasal Mucosa; Nasal Swab  Result Value Ref Range Status   MRSA by PCR Next Gen NOT DETECTED NOT DETECTED Final    Comment: (NOTE) The GeneXpert MRSA Assay (FDA approved for NASAL specimens only), is one component of a comprehensive MRSA colonization surveillance program. It is not intended to diagnose MRSA infection nor to guide or monitor treatment for MRSA  infections. Test performance is not FDA approved in patients less than 38 years old. Performed at [facility], [address], [place], [place] [address]   Culture, blood (Routine X 2) w Reflex to ID Panel     Status: None (Preliminary result)   Collection Time: 11/29/23  5:16 AM   Specimen: BLOOD LEFT ARM  Result Value Ref Range Status   Specimen Description BLOOD LEFT ARM  Final   Special Requests   Final    BOTTLES DRAWN AEROBIC AND ANAEROBIC Blood Culture adequate volume   Culture   Final    NO GROWTH 3 DAYS Performed at [facility], [address] [place], [place] [address]    Report Status PENDING  Incomplete  Culture, blood (  Routine X 2) w Reflex to ID Panel     Status: None (Preliminary result)   Collection Time: 11/29/23  5:16 AM   Specimen: BLOOD RIGHT HAND  Result Value Ref Range Status   Specimen Description BLOOD RIGHT HAND  Final   Special Requests   Final    BOTTLES DRAWN AEROBIC AND ANAEROBIC Blood Culture adequate volume   Culture   Final    NO GROWTH 3 DAYS Performed at [facility], [address], [place], [place] [address]    Report Status PENDING  Incomplete    Radiology Report No results found.    Signature  -   Lynnwood Sauer M.D on 12/02/2023 at 9:54 AM   -  To page go to [email or web address]

## 2023-12-03 DIAGNOSIS — U071 COVID-19: Secondary | ICD-10-CM | POA: Diagnosis not present

## 2023-12-03 LAB — CULTURE, BLOOD (ROUTINE X 2)

## 2023-12-03 LAB — CBC WITH DIFFERENTIAL/PLATELET
Abs Immature Granulocytes: 0.15 10*3/uL — ABNORMAL HIGH (ref 0.00–0.07)
Basophils Absolute: 0 10*3/uL (ref 0.0–0.1)
Basophils Relative: 1 %
Eosinophils Absolute: 0.5 10*3/uL (ref 0.0–0.5)
Eosinophils Relative: 8 %
HCT: 38.8 % — ABNORMAL LOW (ref 39.0–52.0)
Hemoglobin: 12.6 g/dL — ABNORMAL LOW (ref 13.0–17.0)
Immature Granulocytes: 2 %
Lymphocytes Relative: 31 %
Lymphs Abs: 2 10*3/uL (ref 0.7–4.0)
MCH: 29.8 pg (ref 26.0–34.0)
MCHC: 32.5 g/dL (ref 30.0–36.0)
MCV: 91.7 fL (ref 80.0–100.0)
Monocytes Absolute: 0.7 10*3/uL (ref 0.1–1.0)
Monocytes Relative: 10 %
Neutro Abs: 3.2 10*3/uL (ref 1.7–7.7)
Neutrophils Relative %: 48 %
Platelets: 224 10*3/uL (ref 150–400)
RBC: 4.23 MIL/uL (ref 4.22–5.81)
RDW: 13.6 % (ref 11.5–15.5)
WBC: 6.6 10*3/uL (ref 4.0–10.5)
nRBC: 0 % (ref 0.0–0.2)

## 2023-12-03 LAB — GLUCOSE, CAPILLARY
Glucose-Capillary: 105 mg/dL — ABNORMAL HIGH (ref 70–99)
Glucose-Capillary: 79 mg/dL (ref 70–99)
Glucose-Capillary: 135 mg/dL — ABNORMAL HIGH (ref 70–99)
Glucose-Capillary: 80 mg/dL (ref 70–99)
Glucose-Capillary: 137 mg/dL — ABNORMAL HIGH (ref 70–99)

## 2023-12-03 LAB — BASIC METABOLIC PANEL WITH GFR
Anion gap: 10 (ref 5–15)
BUN: 13 mg/dL (ref 8–23)
CO2: 25 mmol/L (ref 22–32)
Calcium: 8.6 mg/dL — ABNORMAL LOW (ref 8.9–10.3)
Chloride: 105 mmol/L (ref 98–111)
Creatinine, Ser: 0.87 mg/dL (ref 0.61–1.24)
GFR, Estimated: >60 mL/min (ref >60)
Glucose, Bld: 95 mg/dL (ref 70–99)
Potassium: 3.5 mmol/L (ref 3.5–5.1)
Sodium: 140 mmol/L (ref 135–145)

## 2023-12-03 LAB — SARS CORONAVIRUS 2 BY RT PCR: SARS Coronavirus 2 by RT PCR: POSITIVE — AB

## 2023-12-03 NOTE — Care Management (Signed)
 Encounter reported to [facility] via online Emergency care reporting link Confirmation [id number]

## 2023-12-03 NOTE — Plan of Care (Signed)
  Problem: Coping: Goal: Ability to adjust to condition or change in health will improve Outcome: Progressing   Problem: Skin Integrity: Goal: Risk for impaired skin integrity will decrease Outcome: Progressing   Problem: Clinical Measurements: Goal: Will remain free from infection Outcome: Progressing Goal: Respiratory complications will improve Outcome: Progressing   Problem: Pain Managment: Goal: General experience of comfort will improve and/or be controlled Outcome: Progressing

## 2023-12-03 NOTE — Progress Notes (Signed)
 PROGRESS NOTE                                                                                                                                                                                                             Patient Demographics:    Lawrence [name], is a 68 y.o. male, DOB - [birth year], ZOX:[id number]  Outpatient Primary MD for the patient is Darnelle Elders, PA-C    LOS - 5  Admit date - 11/27/2023    Chief Complaint  Patient presents with   Level 2 FOT   Code Sepsis       Brief Narrative (HPI from H&P)    68 y.o. male with medical history significant of COPD, severe aortic stenosis status post TAVR in July 2024, seizure disorder, chronic diastolic CHF (follows Dr. Mitzie Anda), atrial fibrillation on Eliquis , CVA, gout, OSA on nighttime CPAP, anxiety, BPH, chronic venous stasis, and morbid obesity presents after having an unwitnessed fall out of his wheelchair.   Records note patient had just recently been hospitalized 3/14-3/19 for sepsis with acute respiratory failure with hypoxia secondary to influenza A with pneumonia and COPD exacerbation.   The patient, who normally uses a wheelchair for mobility, experienced a fall after leaning forward attempting to pick something off the floor with his pick-up stick and hit his head.  Denied any loss of consciousness.  He was found to be febrile was COVID-19 positive, his only complaint was right foot pain, subsequently his blood cultures came back positive for gram-positive cocci 2 out of 2 when he was admitted.   Subjective:   Patient in bed, appears comfortable, denies any headache, no fever, no chest pain or pressure, no shortness of breath , no abdominal pain. No new focal weakness.   Assessment  & Plan :   Sepsis, Incidental COVID-19 infection.  2 out of 2 blood culture positive for Enterococcus.  Source unclear, CT chest abdomen pelvis nonacute, his only complaint is  ongoing pain in his right foot which could be due to underlying gout, x-ray right foot, currently on IV Daptomycin  >> Ampicillin  12/01/23, ID following, appears nontoxic.  Continue supportive care with IV fluids and midodrine  for blood pressure, TTE stable, TEE after he finishes his COVID isolation.  Has underlying history of MR.  Monitor.  COVID test being repeated on 12/03/2023.   Status post  TAVR No acute issues noted per last echocardiogram from 06/2023, kindly see echo above.  HX of gout, ongoing right foot pain for the last few weeks, twitching in the right leg.  Has history of gout and seizures, stable EEG, stable uric acid level stable, hold allopurinol , continue colchicine , even a trial of IV steroid on 11/28/2023, repeat on 11/29/2023, right foot pain and twitching considerably improved.  X-ray of the right foot nonacute.  Incidental 19 infection.  Acute but appears to be incidental.  No cough or shortness of breath, inflammatory markers stable, not a candidate for Paxlovid due to Eliquis .  Continue to monitor.   Hypotension Acute.  Initial blood pressures noted to be as low as 77/38.  Suspect secondary to acute infection.  Blood pressures improved with IV fluids. Blood pressure has stabilized on midodrine , challenge with low-dose diuretics and gentle down titration of midodrine .   Fall out of wheelchair - CT scan of the head and cervical spine did not note any acute abnormality and chronic changes as previously noted on previous MRI imaging from 3/3.  PT/OT to evaluate and treat.   COPD, without acute exacerbation at this time  Patient does not appear to have active wheezing on physical exam although decreased overall aeration. Albuterol  nebs as needed for shortness of breath/wheezing.   Paroxysmal atrial fibrillation, [place] vasc 2 score of greater than 3 on chronic anticoagulation  Patient appears to be in sinus rhythm at this time. Continue Eliquis .  Not on rate controlling agents.   Chr.  Diastolic congestive heart failure - has mild fluid overload which is chronic, blood pressure too low holding diuretics.  Seizure disorder  - Continue Keppra  and lacosamide , has been having twitching in his right lower extremity for a few days, unremarkable EEG.   Hyperlipidemia  - Continue fenofibrate  and atorvastatin    Anxiety and depression - Continue trazodone , mirtazapine , buspirone , venlafaxine , and aripiprazole    Morbid obesity - OSA on CPAP  BMI 46.22 kg/m, follow-up with PCP weight loss, continue nighttime CPAP.   Diabetes mellitus type 2, with long-term use of insulin  -  Patient is on sliding scale insulin , Prandin , and Mounjaro .  Continue Prandin  & SSI  Lab Results  Component Value Date   HGBA1C 6.6 (H) 10/04/2023   CBG (last 3)  Recent Labs    12/02/23 1622 12/02/23 1950 12/03/23 0758  GLUCAP 204* 195* 105*          Condition - Extremely Guarded  Family Communication  :  called [name] [phone] 11/28/2023 at 10 AM message left, updated 12/02/23  Code Status :  DNR  Consults  :  ID, PCCM  PUD Prophylaxis :     Procedures  :     TEE   TTE 1. Left ventricular ejection fraction, by estimation, is 60 to 65%. The left ventricle has normal function. The left ventricle has no regional wall motion abnormalities. Left ventricular diastolic function could not be evaluated.  2. Right ventricular systolic function is normal. The right ventricular size is normal. Tricuspid regurgitation signal is inadequate for assessing PA pressure.  3. Moderate pericardial effusion. The pericardial effusion is circumferential.  4. The mitral valve is normal in structure. No evidence of mitral valve regurgitation. No evidence of mitral stenosis.  5. The aortic valve is normal in structure. Aortic valve regurgitation is not visualized. No aortic stenosis is present. There is a 34 Medtronic CoreValve-Evolut Pro prosthetic (TAVR) valve present in the aortic position. Aortic valve area, by VTI  measures  1.58 cm. Aortic valve mean gradient measures 19.0 mmHg. Aortic valve Vmax measures 2.63 m/s.  6. The inferior vena cava is dilated in size with <50% respiratory variability, suggesting right atrial pressure of 15 mmHg. Conclusion(s)/Recommendation(s): Images are not adequate to rule out vegetation. Recommend TEE for further evaluation.  EEG - non acute  CT head.  C-spine.    CT head: 1.  No evidence of an acute intracranial abnormality. 2. Forehead hematoma on the right. 3. Cerebral atrophy. 4. Paranasal sinus disease as described.   CT cervical spine: 1. No evidence of an acute cervical spine fracture. 2. Grade 1 anterolisthesis at C2-C3, C3-C4 and C4-C5. 3. Levocurvature of the cervical spine. 4. Cervical spondylosis as described within the body of the report. Multifactorial severe spinal canal stenosis at C3-C4 (as was demonstrated on the recent prior cervical spine MRI of 10/09/2023). Multilevel bony neural foraminal narrowing. 5. Erosions noted along the left C3-C4 facet joint, a finding which can be seen in setting of CPPD arthropathy. 6. 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  CT Chest, abdomen and pelvis-  1. No acute findings. 2. Aortic Atherosclerosis (ICD10-I70.0) and Emphysema (ICD10-J43.9). Coronary and systemic atherosclerosis. 3. Sigmoid colon diverticulosis. 4. Gluteal and upper thigh muscular atrophy with the exception of the hip adductor musculature. There is some paraspinal muscular atrophy and atrophy of the latissimus dorsi muscles. 5. Dorsal stimulator device noted electrodes at the T12-L1 level.      Disposition Plan  :    Status is: Inpatient   DVT Prophylaxis  :    Place TED hose Start: 11/28/23 1000 apixaban  (ELIQUIS ) tablet 5 mg     Lab Results  Component Value Date   PLT 224 12/03/2023    Diet :  Diet Order             Diet heart healthy/carb modified Room service appropriate? Yes; Fluid consistency: Thin  Diet effective now                     Inpatient Medications  Scheduled Meds:  apixaban   5 mg Oral BID   ARIPiprazole   5 mg Oral QHS   busPIRone   10 mg Oral TID   colchicine   0.6 mg Oral BID   cyanocobalamin   1,000 mcg Oral Daily   fenofibrate   160 mg Oral Daily   fluticasone   2 spray Each Nare Daily   guaiFENesin   600 mg Oral BID   insulin  aspart  0-5 Units Subcutaneous QHS   insulin  aspart  0-9 Units Subcutaneous TID WC   lacosamide   150 mg Oral BID   levETIRAcetam   500 mg Oral BID   midodrine   5 mg Oral TID WC   mirtazapine   15 mg Oral QHS   prazosin   2 mg Oral QHS   pregabalin   150 mg Oral TID   repaglinide   2 mg Oral BID   rOPINIRole   0.25 mg Oral BID   sodium chloride  flush  3 mL Intravenous Q12H   spironolactone   50 mg Oral Daily   torsemide   20 mg Oral Daily   traZODone   200 mg Oral QHS   umeclidinium bromide   1 puff Inhalation Daily   venlafaxine  XR  150 mg Oral Q breakfast   Continuous Infusions:  ampicillin  (OMNIPEN) IV Stopped (12/03/23 0500)   DAPTOmycin  Stopped (12/02/23 1945)   PRN Meds:.acetaminophen  **OR** acetaminophen , albuterol , bisacodyl , HYDROcodone -acetaminophen , Muscle Rub, ondansetron  **OR** ondansetron  (ZOFRAN ) IV     Objective:   Vitals:   12/02/23  2336 12/03/23 0453 12/03/23 0500 12/03/23 0801  BP: 123/87 (!) 132/90  114/71  Pulse: 80 68  75  Resp: 16 (!) 24  16  Temp: 98.3 F (36.8 C) 98 F (36.7 C)  (!) 97.5 F (36.4 C)  TempSrc: Oral Oral  Oral  SpO2: 94% 95%  96%  Weight:   (!) 159.3 kg   Height:        Wt Readings from Last 3 Encounters:  12/03/23 (!) 159.3 kg  10/25/23 (!) 150 kg  10/10/23 (!) 157 kg     Intake/Output Summary (Last 24 hours) at 12/03/2023 0908 Last data filed at 12/03/2023 0130 Gross per 24 hour  Intake 363 ml  Output 4350 ml  Net -3987 ml     Physical Exam  Awake Alert, No new F.N deficits, Normal affect [place].AT,PERRAL Supple Neck, No JVD,   Symmetrical Chest wall movement, Good air movement bilaterally, CTAB RRR,No  Gallops,Rubs or new Murmurs,  +ve B.Sounds, Abd Soft, No tenderness,   Trace edema, mild R foot tenderness, bilateral first MTP bunions    Data Review:    Recent Labs  Lab 11/29/23 0516 11/30/23 0511 12/01/23 0442 12/02/23 0516 12/03/23 0559  WBC 4.6 4.9 4.9 5.8 6.6  HGB 12.0* 12.2* 12.6* 13.3 12.6*  HCT 35.9* 37.1* 38.1* 39.9 38.8*  PLT 185 182 195 184 224  MCV 89.5 90.7 91.1 90.9 91.7  MCH 29.9 29.8 30.1 30.3 29.8  MCHC 33.4 32.9 33.1 33.3 32.5  RDW 13.2 13.2 13.3 13.3 13.6  LYMPHSABS 0.8 1.5 1.5 1.6 2.0  MONOABS 0.5 0.6 0.5 0.6 0.7  EOSABS 0.0 0.1 0.2 0.4 0.5  BASOSABS 0.0 0.0 0.0 0.0 0.0    Recent Labs  Lab 11/27/23 0933 11/27/23 0959 11/27/23 1054 11/27/23 1300 11/27/23 2109 11/28/23 0435 11/29/23 0516 11/30/23 0511 12/01/23 0442 12/02/23 0516 12/03/23 0559  NA 137  --    < >  --   --  137 140 141 141 141 140  K 3.7  --    < >  --   --  3.2* 3.8 3.7 3.3* 3.6 3.5  CL 101  --    < >  --   --  104 108 106 104 104 105  CO2 26  --   --   --   --  26 24 26 26 28 25   ANIONGAP 10  --   --   --   --  7 8 9 11 9 10   GLUCOSE 196*  --    < >  --   --  97 129* 100* 76 85 95  BUN 14  --    < >  --   --  13 15 18 17 16 13   CREATININE 1.12  --    < >  --   --  1.05 0.84 1.03 1.02 1.00 0.87  AST 25  --   --   --   --   --   --   --   --   --   --   ALT 25  --   --   --   --   --   --   --   --   --   --   ALKPHOS 43  --   --   --   --   --   --   --   --   --   --   BILITOT 0.9  --   --   --   --   --   --   --   --   --   --  ALBUMIN 3.3*  --   --   --   --   --   --   --   --   --   --   CRP  --   --   --   --   --  4.1* 4.4* 1.1* 0.6 0.5  --   PROCALCITON  --   --   --   --    < > 0.35 0.17 <0.10 <0.10 <0.10  --   LATICACIDVEN  --  1.8  --  0.8  --   --   --   --   --   --   --   INR 1.1  --   --   --   --   --   --   --   --   --   --   BNP  --   --   --   --    < > 94.5 149.1* 144.4* 96.6 64.1  --   MG  --   --   --   --   --   --  2.2 2.0 2.0 2.0  --   CALCIUM   8.7*  --   --   --   --  8.4* 8.9 8.9 8.8* 8.7* 8.6*   < > = values in this interval not displayed.      Recent Labs  Lab 11/27/23 0933 11/27/23 0959 11/27/23 1300 11/27/23 2109 11/28/23 0435 11/29/23 0516 11/30/23 0511 12/01/23 0442 12/02/23 0516 12/03/23 0559  CRP  --   --   --   --  4.1* 4.4* 1.1* 0.6 0.5  --   PROCALCITON  --   --   --    < > 0.35 0.17 <0.10 <0.10 <0.10  --   LATICACIDVEN  --  1.8 0.8  --   --   --   --   --   --   --   INR 1.1  --   --   --   --   --   --   --   --   --   BNP  --   --   --    < > 94.5 149.1* 144.4* 96.6 64.1  --   MG  --   --   --   --   --  2.2 2.0 2.0 2.0  --   CALCIUM  8.7*  --   --   --  8.4* 8.9 8.9 8.8* 8.7* 8.6*   < > = values in this interval not displayed.    --------------------------------------------------------------------------------------------------------------- Lab Results  Component Value Date   CHOL 151 06/25/2023   HDL 30 (L) 06/25/2023   LDLCALC 79 06/25/2023   TRIG 209 (H) 06/25/2023   CHOLHDL 5.0 06/25/2023    Lab Results  Component Value Date   HGBA1C 6.6 (H) 10/04/2023      Micro Results Recent Results (from the past 240 hours)  Resp panel by RT-PCR (RSV, Flu A&B, Covid) Anterior Nasal Swab     Status: Abnormal   Collection Time: 11/27/23  9:33 AM   Specimen: Anterior Nasal Swab  Result Value Ref Range Status   SARS Coronavirus 2 by RT PCR POSITIVE (A) NEGATIVE Final   Influenza A by PCR NEGATIVE NEGATIVE Final   Influenza B by PCR NEGATIVE NEGATIVE Final    Comment: (NOTE) The Xpert Xpress SARS-CoV-2/FLU/RSV plus assay is intended as an aid in the diagnosis of influenza from Nasopharyngeal swab specimens  and should not be used as a sole basis for treatment. Nasal washings and aspirates are unacceptable for Xpert Xpress SARS-CoV-2/FLU/RSV testing.  Fact Sheet for Patients: BloggerCourse.com  Fact Sheet for Healthcare  Providers: SeriousBroker.it  This test is not yet approved or cleared by the [place]  FDA and has been authorized for detection and/or diagnosis of SARS-CoV-2 by FDA under an Emergency Use Authorization (EUA). This EUA will remain in effect (meaning this test can be used) for the duration of the COVID-19 declaration under Section 564(b)(1) of the Act, 21 U.S.C. section 360bbb-3(b)(1), unless the authorization is terminated or revoked.     Resp Syncytial Virus by PCR NEGATIVE NEGATIVE Final    Comment: (NOTE) Fact Sheet for Patients: BloggerCourse.com  Fact Sheet for Healthcare Providers: SeriousBroker.it  This test is not yet approved or cleared by the [place]  FDA and has been authorized for detection and/or diagnosis of SARS-CoV-2 by FDA under an Emergency Use Authorization (EUA). This EUA will remain in effect (meaning this test can be used) for the duration of the COVID-19 declaration under Section 564(b)(1) of the Act, 21 U.S.C. section 360bbb-3(b)(1), unless the authorization is terminated or revoked.  Performed at [facility], [address], [place], [place] [address]   Blood Culture (routine x 2)     Status: Abnormal (Preliminary result)   Collection Time: 11/27/23  9:33 AM   Specimen: BLOOD  Result Value Ref Range Status   Specimen Description BLOOD LEFT ANTECUBITAL  Final   Special Requests   Final    BOTTLES DRAWN AEROBIC AND ANAEROBIC Blood Culture results may not be optimal due to an inadequate volume of blood received in culture bottles   Culture  Setup Time   Final    GRAM POSITIVE COCCI IN BOTH AEROBIC AND ANAEROBIC BOTTLES CRITICAL RESULT CALLED TO, READ BACK BY AND VERIFIED WITH: PHARMD GREG A [id number] FCP    Culture (A)  Final    ENTEROCOCCUS FAECIUM VANCOMYCIN  RESISTANT ENTEROCOCCUS Sent to [facility] for further susceptibility testing. Performed at [facility], [address] [place], [place] [address]    Report Status PENDING  Incomplete   Organism ID, Bacteria ENTEROCOCCUS FAECIUM  Final      Susceptibility   Enterococcus faecium - MIC*    AMPICILLIN  >=32 RESISTANT Resistant     VANCOMYCIN  >=32 RESISTANT Resistant     GENTAMICIN SYNERGY SENSITIVE Sensitive     * ENTEROCOCCUS FAECIUM  Blood Culture ID Panel (Reflexed)     Status: Abnormal   Collection Time: 11/27/23  9:33 AM  Result Value Ref Range Status   Enterococcus faecalis NOT DETECTED NOT DETECTED Final   Enterococcus Faecium DETECTED (A) NOT DETECTED Final    Comment: CRITICAL RESULT CALLED TO, READ BACK BY AND VERIFIED WITH: PHARMD GREG A [id number] FCP      Listeria monocytogenes NOT DETECTED NOT DETECTED Final   Staphylococcus species NOT DETECTED NOT DETECTED Final   Staphylococcus aureus (BCID) NOT DETECTED NOT DETECTED Final   Staphylococcus epidermidis NOT DETECTED NOT DETECTED Final   Staphylococcus lugdunensis NOT DETECTED NOT DETECTED Final   Streptococcus species NOT DETECTED NOT DETECTED Final   Streptococcus agalactiae NOT DETECTED NOT DETECTED Final   Streptococcus pneumoniae NOT DETECTED NOT DETECTED Final   Streptococcus pyogenes NOT DETECTED NOT DETECTED Final   A.calcoaceticus-baumannii NOT DETECTED NOT DETECTED Final   Bacteroides fragilis NOT DETECTED NOT DETECTED Final   Enterobacterales NOT DETECTED NOT DETECTED Final   Enterobacter cloacae  complex NOT DETECTED NOT DETECTED Final   Escherichia coli NOT DETECTED NOT DETECTED Final   Klebsiella aerogenes NOT DETECTED NOT DETECTED Final   Klebsiella oxytoca NOT DETECTED NOT DETECTED Final   Klebsiella pneumoniae NOT DETECTED NOT DETECTED Final   Proteus species NOT DETECTED NOT DETECTED Final   Salmonella species NOT DETECTED NOT DETECTED Final   Serratia marcescens NOT DETECTED NOT DETECTED Final   Haemophilus influenzae NOT DETECTED NOT DETECTED Final   Neisseria meningitidis NOT DETECTED NOT  DETECTED Final   Pseudomonas aeruginosa NOT DETECTED NOT DETECTED Final   Stenotrophomonas maltophilia NOT DETECTED NOT DETECTED Final   Candida albicans NOT DETECTED NOT DETECTED Final   Candida auris NOT DETECTED NOT DETECTED Final   Candida glabrata NOT DETECTED NOT DETECTED Final   Candida krusei NOT DETECTED NOT DETECTED Final   Candida parapsilosis NOT DETECTED NOT DETECTED Final   Candida tropicalis NOT DETECTED NOT DETECTED Final   Cryptococcus neoformans/gattii NOT DETECTED NOT DETECTED Final   Vancomycin  resistance DETECTED (A) NOT DETECTED Final    Comment: CRITICAL RESULT CALLED TO, READ BACK BY AND VERIFIED WITH: Alycia Babcock A [id number] FCP   Performed at [facility], [address], [place], [place] [address]   Blood Culture (routine x 2)     Status: Abnormal   Collection Time: 11/27/23 10:23 AM   Specimen: BLOOD  Result Value Ref Range Status   Specimen Description BLOOD RIGHT ANTECUBITAL  Final   Special Requests   Final    BOTTLES DRAWN AEROBIC AND ANAEROBIC Blood Culture results may not be optimal due to an inadequate volume of blood received in culture bottles   Culture  Setup Time   Final    GRAM POSITIVE COCCI IN BOTH AEROBIC AND ANAEROBIC BOTTLES CRITICAL VALUE NOTED.  VALUE IS CONSISTENT WITH PREVIOUSLY REPORTED AND CALLED VALUE.    Culture (A)  Final    ENTEROCOCCUS FAECIUM SUSCEPTIBILITIES PERFORMED ON PREVIOUS CULTURE WITHIN THE LAST 5 DAYS. Performed at [facility], [address], [place], [place] [address]    Report Status 11/30/2023 FINAL  Final  MRSA Next Gen by PCR, Nasal     Status: None   Collection Time: 11/28/23  5:52 AM   Specimen: Nasal Mucosa; Nasal Swab  Result Value Ref Range Status   MRSA by PCR Next Gen NOT DETECTED NOT DETECTED Final    Comment: (NOTE) The GeneXpert MRSA Assay (FDA approved for NASAL specimens only), is one component of a comprehensive MRSA colonization surveillance program. It is not intended to  diagnose MRSA infection nor to guide or monitor treatment for MRSA infections. Test performance is not FDA approved in patients less than 71 years old. Performed at [facility], [address], [place], [place] [address]   Culture, blood (Routine X 2) w Reflex to ID Panel     Status: None (Preliminary result)   Collection Time: 11/29/23  5:16 AM   Specimen: BLOOD LEFT ARM  Result Value Ref Range Status   Specimen Description BLOOD LEFT ARM  Final   Special Requests   Final    BOTTLES DRAWN AEROBIC AND ANAEROBIC Blood Culture adequate volume   Culture   Final    NO GROWTH 3 DAYS Performed at [facility], [address] [place], [place] [address]    Report Status PENDING  Incomplete  Culture, blood (Routine X 2) w Reflex to ID Panel     Status: None (Preliminary result)  Collection Time: 11/29/23  5:16 AM   Specimen: BLOOD RIGHT HAND  Result Value Ref Range Status   Specimen Description BLOOD RIGHT HAND  Final   Special Requests   Final    BOTTLES DRAWN AEROBIC AND ANAEROBIC Blood Culture adequate volume   Culture   Final    NO GROWTH 3 DAYS Performed at [facility], [address], [place], [place] [address]    Report Status PENDING  Incomplete    Radiology Report No results found.    Signature  -   Lynnwood Sauer M.D on 12/03/2023 at 9:08 AM   -  To page go to [email or web address]

## 2023-12-03 NOTE — Progress Notes (Signed)
 ID PROGRESS NOTE   Afebrile   VRE bacteremia in the setting of TAVR concern for endocarditis. Also found to have mild COVID-19 illness   Will check covid-19 pcr CT value today to see if no longer in infectious window then discuss with cardiology to get TEE  Continue on dual therapy of amp/daptomycin .  Gerold Kos Levern Reader MD MPH [facility] [phone]

## 2023-12-04 DIAGNOSIS — U071 COVID-19: Secondary | ICD-10-CM

## 2023-12-04 LAB — CULTURE, BLOOD (ROUTINE X 2)
Culture: NO GROWTH
Culture: NO GROWTH
Special Requests: ADEQUATE
Special Requests: ADEQUATE

## 2023-12-04 LAB — GLUCOSE, CAPILLARY
Glucose-Capillary: 131 mg/dL — ABNORMAL HIGH (ref 70–99)
Glucose-Capillary: 81 mg/dL (ref 70–99)
Glucose-Capillary: 169 mg/dL — ABNORMAL HIGH (ref 70–99)
Glucose-Capillary: 70 mg/dL (ref 70–99)
Glucose-Capillary: 102 mg/dL — ABNORMAL HIGH (ref 70–99)

## 2023-12-04 NOTE — Progress Notes (Addendum)
 PROGRESS NOTE                                                                                                                                                                                                             Patient Demographics:    Lawrence [name], is a 68 y.o. male, DOB - 1956-06-26, WUJ:[id number]  Outpatient Primary MD for the patient is Darnelle Elders, PA-C    LOS - 6  Admit date - 11/27/2023    Chief Complaint  Patient presents with   Level 2 FOT   Code Sepsis       Brief Narrative (HPI from H&P)    68 y.o. male with medical history significant of COPD, severe aortic stenosis status post TAVR in July 2024, seizure disorder, chronic diastolic CHF (follows Dr. Mitzie Anda), atrial fibrillation on Eliquis , CVA, gout, OSA on nighttime CPAP, anxiety, BPH, chronic venous stasis, and morbid obesity presents after having an unwitnessed fall out of his wheelchair.   Records note patient had just recently been hospitalized 3/14-3/19 for sepsis with acute respiratory failure with hypoxia secondary to influenza A with pneumonia and COPD exacerbation.   The patient, who normally uses a wheelchair for mobility, experienced a fall after leaning forward attempting to pick something off the floor with his pick-up stick and hit his head.  Denied any loss of consciousness.  He was found to be febrile was COVID-19 positive, his only complaint was right foot pain, subsequently his blood cultures came back positive for gram-positive cocci 2 out of 2 when he was admitted.   Subjective:   Patient in bed, appears comfortable, denies any headache, no fever, no chest pain or pressure, no shortness of breath , no abdominal pain. No new focal weakness.    Assessment  & Plan :   Sepsis, Incidental COVID-19 infection.  2 out of 2 blood culture positive for Enterococcus.  Source unclear, CT chest abdomen pelvis nonacute, his only complaint is  ongoing pain in his right foot which could be due to underlying gout, x-ray right foot, currently on IV Daptomycin  >> Ampicillin  12/01/23, ID following, appears nontoxic.  Continue supportive care with IV fluids and midodrine  for blood pressure, TTE stable, TEE after he finishes his COVID isolation.  Has underlying history of MR.  Monitor.  COVID test  repeated on 12/03/2023, +ve CT value -  is still 25.8, previous was 19, so definitely improving and trending in the right direction.   Status post TAVR No acute issues noted per last echocardiogram from 06/2023, kindly see echo above.  HX of gout, ongoing right foot pain for the last few weeks, twitching in the right leg.  Has history of gout and seizures, stable EEG, stable uric acid level stable, hold allopurinol , continue colchicine , even a trial of IV steroid on 11/28/2023, repeat on 11/29/2023, right foot pain and twitching considerably improved.  X-ray of the right foot nonacute.  Incidental 19 infection.  Acute but appears to be incidental.  No cough or shortness of breath, inflammatory markers stable, not a candidate for Paxlovid due to Eliquis .  Continue to monitor.   Hypotension Acute.  Initial blood pressures noted to be as low as 77/38.  Suspect secondary to acute infection.  Blood pressures improved with IV fluids. Blood pressure has stabilized on midodrine , challenge with low-dose diuretics and gentle down titration of midodrine .   Fall out of wheelchair - CT scan of the head and cervical spine did not note any acute abnormality and chronic changes as previously noted on previous MRI imaging from 3/3.  PT/OT to evaluate and treat.   COPD, without acute exacerbation at this time  Patient does not appear to have active wheezing on physical exam although decreased overall aeration. Albuterol  nebs as needed for shortness of breath/wheezing.   Paroxysmal atrial fibrillation, [place] vasc 2 score of greater than 3 on chronic anticoagulation  Patient  appears to be in sinus rhythm at this time. Continue Eliquis .  Not on rate controlling agents.   Chr. Diastolic congestive heart failure - has mild fluid overload which is chronic, blood pressure too low holding diuretics.  Seizure disorder  - Continue Keppra  and lacosamide , has been having twitching in his right lower extremity for a few days, unremarkable EEG.   Hyperlipidemia  - Continue fenofibrate  and atorvastatin    Anxiety and depression - Continue trazodone , mirtazapine , buspirone , venlafaxine , and aripiprazole    Morbid obesity - OSA on CPAP  BMI 46.22 kg/m, follow-up with PCP weight loss, continue nighttime CPAP.   Diabetes mellitus type 2, with long-term use of insulin  -  Patient is on sliding scale insulin , Prandin , and Mounjaro .  Continue Prandin  & SSI  Lab Results  Component Value Date   HGBA1C 6.6 (H) 10/04/2023   CBG (last 3)  Recent Labs    12/03/23 1547 12/03/23 1622 12/03/23 2030  GLUCAP 80 135* 137*          Condition - Extremely Guarded  Family Communication  :  called [name] [phone] 11/28/2023 at 10 AM message left, updated 12/02/23  Code Status :  DNR  Consults  :  ID, PCCM  PUD Prophylaxis :     Procedures  :     TEE   TTE 1. Left ventricular ejection fraction, by estimation, is 60 to 65%. The left ventricle has normal function. The left ventricle has no regional wall motion abnormalities. Left ventricular diastolic function could not be evaluated.  2. Right ventricular systolic function is normal. The right ventricular size is normal. Tricuspid regurgitation signal is inadequate for assessing PA pressure.  3. Moderate pericardial effusion. The pericardial effusion is circumferential.  4. The mitral valve is normal in structure. No evidence of mitral valve regurgitation. No evidence of mitral stenosis.  5. The aortic valve is normal in structure. Aortic valve regurgitation is not visualized. No aortic stenosis is present. There is a  34 [employer]tronic  CoreValve-Evolut Pro prosthetic (TAVR) valve present in the aortic position. Aortic valve area, by VTI measures 1.58 cm. Aortic valve mean gradient measures 19.0 mmHg. Aortic valve Vmax measures 2.63 m/s.  6. The inferior vena cava is dilated in size with <50% respiratory variability, suggesting right atrial pressure of 15 mmHg. Conclusion(s)/Recommendation(s): Images are not adequate to rule out vegetation. Recommend TEE for further evaluation.  EEG - non acute  CT head.  C-spine.    CT head: 1.  No evidence of an acute intracranial abnormality. 2. Forehead hematoma on the right. 3. Cerebral atrophy. 4. Paranasal sinus disease as described.   CT cervical spine: 1. No evidence of an acute cervical spine fracture. 2. Grade 1 anterolisthesis at C2-C3, C3-C4 and C4-C5. 3. Levocurvature of the cervical spine. 4. Cervical spondylosis as described within the body of the report. Multifactorial severe spinal canal stenosis at C3-C4 (as was demonstrated on the recent prior cervical spine MRI of 10/09/2023). Multilevel bony neural foraminal narrowing. 5. Erosions noted along the left C3-C4 facet joint, a finding which can be seen in setting of CPPD arthropathy. 6. 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  CT Chest, abdomen and pelvis-  1. No acute findings. 2. Aortic Atherosclerosis (ICD10-I70.0) and Emphysema (ICD10-J43.9). Coronary and systemic atherosclerosis. 3. Sigmoid colon diverticulosis. 4. Gluteal and upper thigh muscular atrophy with the exception of the hip adductor musculature. There is some paraspinal muscular atrophy and atrophy of the latissimus dorsi muscles. 5. Dorsal stimulator device noted electrodes at the T12-L1 level.      Disposition Plan  :    Status is: Inpatient   DVT Prophylaxis  :    Place TED hose Start: 11/28/23 1000 apixaban  (ELIQUIS ) tablet 5 mg     Lab Results  Component Value Date   PLT 224 12/03/2023    Diet :  Diet Order             Diet heart healthy/carb  modified Room service appropriate? Yes; Fluid consistency: Thin  Diet effective now                    Inpatient Medications  Scheduled Meds:  apixaban   5 mg Oral BID   ARIPiprazole   5 mg Oral QHS   busPIRone   10 mg Oral TID   colchicine   0.6 mg Oral BID   cyanocobalamin   1,000 mcg Oral Daily   fenofibrate   160 mg Oral Daily   fluticasone   2 spray Each Nare Daily   guaiFENesin   600 mg Oral BID   insulin  aspart  0-5 Units Subcutaneous QHS   insulin  aspart  0-9 Units Subcutaneous TID WC   lacosamide   150 mg Oral BID   levETIRAcetam   500 mg Oral BID   mirtazapine   15 mg Oral QHS   prazosin   2 mg Oral QHS   pregabalin   150 mg Oral TID   repaglinide   2 mg Oral BID   rOPINIRole   0.25 mg Oral BID   sodium chloride  flush  3 mL Intravenous Q12H   spironolactone   50 mg Oral Daily   torsemide   20 mg Oral Daily   traZODone   200 mg Oral QHS   umeclidinium bromide   1 puff Inhalation Daily   venlafaxine  XR  150 mg Oral Q breakfast   Continuous Infusions:  ampicillin  (OMNIPEN) IV 2 g (12/04/23 0645)   DAPTOmycin  Stopped (12/03/23 2000)   PRN Meds:.acetaminophen  **OR** acetaminophen , albuterol , bisacodyl , HYDROcodone -acetaminophen , Muscle Rub, ondansetron  **OR** ondansetron  (ZOFRAN ) IV  Objective:   Vitals:   12/04/23 0024 12/04/23 0043 12/04/23 0418 12/04/23 0500  BP: 129/87  (!) 141/87   Pulse: 82 74 69   Resp: 14 15 17    Temp: 98.7 F (37.1 C)  (!) 97.2 F (36.2 C)   TempSrc: Oral  Oral   SpO2: 95% 96% 95%   Weight:    (!) 160 kg  Height:        Wt Readings from Last 3 Encounters:  12/04/23 (!) 160 kg  10/25/23 (!) 150 kg  10/10/23 (!) 157 kg     Intake/Output Summary (Last 24 hours) at 12/04/2023 0721 Last data filed at 12/04/2023 0417 Gross per 24 hour  Intake 243 ml  Output 4425 ml  Net -4182 ml     Physical Exam  Awake Alert, No new F.N deficits, Normal affect [place].AT,PERRAL Supple Neck, No JVD,   Symmetrical Chest wall movement, Good air movement  bilaterally, CTAB RRR,No Gallops,Rubs or new Murmurs,  +ve B.Sounds, Abd Soft, No tenderness,   Trace edema, mild R foot tenderness, bilateral first MTP bunions    Data Review:    Recent Labs  Lab 11/29/23 0516 11/30/23 0511 12/01/23 0442 12/02/23 0516 12/03/23 0559  WBC 4.6 4.9 4.9 5.8 6.6  HGB 12.0* 12.2* 12.6* 13.3 12.6*  HCT 35.9* 37.1* 38.1* 39.9 38.8*  PLT 185 182 195 184 224  MCV 89.5 90.7 91.1 90.9 91.7  MCH 29.9 29.8 30.1 30.3 29.8  MCHC 33.4 32.9 33.1 33.3 32.5  RDW 13.2 13.2 13.3 13.3 13.6  LYMPHSABS 0.8 1.5 1.5 1.6 2.0  MONOABS 0.5 0.6 0.5 0.6 0.7  EOSABS 0.0 0.1 0.2 0.4 0.5  BASOSABS 0.0 0.0 0.0 0.0 0.0    Recent Labs  Lab 11/27/23 0933 11/27/23 0959 11/27/23 1054 11/27/23 1300 11/27/23 2109 11/28/23 0435 11/29/23 0516 11/30/23 0511 12/01/23 0442 12/02/23 0516 12/03/23 0559  NA 137  --    < >  --   --  137 140 141 141 141 140  K 3.7  --    < >  --   --  3.2* 3.8 3.7 3.3* 3.6 3.5  CL 101  --    < >  --   --  104 108 106 104 104 105  CO2 26  --   --   --   --  26 24 26 26 28 25   ANIONGAP 10  --   --   --   --  7 8 9 11 9 10   GLUCOSE 196*  --    < >  --   --  97 129* 100* 76 85 95  BUN 14  --    < >  --   --  13 15 18 17 16 13   CREATININE 1.12  --    < >  --   --  1.05 0.84 1.03 1.02 1.00 0.87  AST 25  --   --   --   --   --   --   --   --   --   --   ALT 25  --   --   --   --   --   --   --   --   --   --   ALKPHOS 43  --   --   --   --   --   --   --   --   --   --   BILITOT 0.9  --   --   --   --   --   --   --   --   --   --  ALBUMIN 3.3*  --   --   --   --   --   --   --   --   --   --   CRP  --   --   --   --   --  4.1* 4.4* 1.1* 0.6 0.5  --   PROCALCITON  --   --   --   --    < > 0.35 0.17 <0.10 <0.10 <0.10  --   LATICACIDVEN  --  1.8  --  0.8  --   --   --   --   --   --   --   INR 1.1  --   --   --   --   --   --   --   --   --   --   BNP  --   --   --   --    < > 94.5 149.1* 144.4* 96.6 64.1  --   MG  --   --   --   --   --   --  2.2  2.0 2.0 2.0  --   CALCIUM  8.7*  --   --   --   --  8.4* 8.9 8.9 8.8* 8.7* 8.6*   < > = values in this interval not displayed.      Recent Labs  Lab 11/27/23 0933 11/27/23 0959 11/27/23 1300 11/27/23 2109 11/28/23 0435 11/29/23 0516 11/30/23 0511 12/01/23 0442 12/02/23 0516 12/03/23 0559  CRP  --   --   --   --  4.1* 4.4* 1.1* 0.6 0.5  --   PROCALCITON  --   --   --    < > 0.35 0.17 <0.10 <0.10 <0.10  --   LATICACIDVEN  --  1.8 0.8  --   --   --   --   --   --   --   INR 1.1  --   --   --   --   --   --   --   --   --   BNP  --   --   --    < > 94.5 149.1* 144.4* 96.6 64.1  --   MG  --   --   --   --   --  2.2 2.0 2.0 2.0  --   CALCIUM  8.7*  --   --   --  8.4* 8.9 8.9 8.8* 8.7* 8.6*   < > = values in this interval not displayed.    --------------------------------------------------------------------------------------------------------------- Lab Results  Component Value Date   CHOL 151 06/25/2023   HDL 30 (L) 06/25/2023   LDLCALC 79 06/25/2023   TRIG 209 (H) 06/25/2023   CHOLHDL 5.0 06/25/2023    Lab Results  Component Value Date   HGBA1C 6.6 (H) 10/04/2023      Micro Results Recent Results (from the past 240 hours)  Resp panel by RT-PCR (RSV, Flu A&B, Covid) Anterior Nasal Swab     Status: Abnormal   Collection Time: 11/27/23  9:33 AM   Specimen: Anterior Nasal Swab  Result Value Ref Range Status   SARS Coronavirus 2 by RT PCR POSITIVE (A) NEGATIVE Final   Influenza A by PCR NEGATIVE NEGATIVE Final   Influenza B by PCR NEGATIVE NEGATIVE Final    Comment: (NOTE) The Xpert Xpress SARS-CoV-2/FLU/RSV plus assay is intended as an aid in the diagnosis of influenza from Nasopharyngeal swab specimens  and should not be used as a sole basis for treatment. Nasal washings and aspirates are unacceptable for Xpert Xpress SARS-CoV-2/FLU/RSV testing.  Fact Sheet for Patients: BloggerCourse.com  Fact Sheet for Healthcare  Providers: SeriousBroker.it  This test is not yet approved or cleared by the [place]  FDA and has been authorized for detection and/or diagnosis of SARS-CoV-2 by FDA under an Emergency Use Authorization (EUA). This EUA will remain in effect (meaning this test can be used) for the duration of the COVID-19 declaration under Section 564(b)(1) of the Act, 21 U.S.C. section 360bbb-3(b)(1), unless the authorization is terminated or revoked.     Resp Syncytial Virus by PCR NEGATIVE NEGATIVE Final    Comment: (NOTE) Fact Sheet for Patients: BloggerCourse.com  Fact Sheet for Healthcare Providers: SeriousBroker.it  This test is not yet approved or cleared by the [place]  FDA and has been authorized for detection and/or diagnosis of SARS-CoV-2 by FDA under an Emergency Use Authorization (EUA). This EUA will remain in effect (meaning this test can be used) for the duration of the COVID-19 declaration under Section 564(b)(1) of the Act, 21 U.S.C. section 360bbb-3(b)(1), unless the authorization is terminated or revoked.  Performed at [facility], [address] [place], [place] [address]   Blood Culture (routine x 2)     Status: Abnormal   Collection Time: 11/27/23  9:33 AM   Specimen: BLOOD  Result Value Ref Range Status   Specimen Description BLOOD LEFT ANTECUBITAL  Final   Special Requests   Final    BOTTLES DRAWN AEROBIC AND ANAEROBIC Blood Culture results may not be optimal due to an inadequate volume of blood received in culture bottles   Culture  Setup Time   Final    GRAM POSITIVE COCCI IN BOTH AEROBIC AND ANAEROBIC BOTTLES CRITICAL RESULT CALLED TO, READ BACK BY AND VERIFIED WITH: PHARMD GREG A [id number] FCP    Culture (A)  Final    ENTEROCOCCUS FAECIUM VANCOMYCIN  RESISTANT ENTEROCOCCUS SEE SEPARATE REPORT Performed at [facility], [address], [place], [place]  [address]    Report Status 12/03/2023 FINAL  Final   Organism ID, Bacteria ENTEROCOCCUS FAECIUM  Final      Susceptibility   Enterococcus faecium - MIC*    AMPICILLIN  >=32 RESISTANT Resistant     VANCOMYCIN  >=32 RESISTANT Resistant     GENTAMICIN SYNERGY SENSITIVE Sensitive     * ENTEROCOCCUS FAECIUM  Blood Culture ID Panel (Reflexed)     Status: Abnormal   Collection Time: 11/27/23  9:33 AM  Result Value Ref Range Status   Enterococcus faecalis NOT DETECTED NOT DETECTED Final   Enterococcus Faecium DETECTED (A) NOT DETECTED Final    Comment: CRITICAL RESULT CALLED TO, READ BACK BY AND VERIFIED WITH: PHARMD GREG A [id number] FCP      Listeria monocytogenes NOT DETECTED NOT DETECTED Final   Staphylococcus species NOT DETECTED NOT DETECTED Final   Staphylococcus aureus (BCID) NOT DETECTED NOT DETECTED Final   Staphylococcus epidermidis NOT DETECTED NOT DETECTED Final   Staphylococcus lugdunensis NOT DETECTED NOT DETECTED Final   Streptococcus species NOT DETECTED NOT DETECTED Final   Streptococcus agalactiae NOT DETECTED NOT DETECTED Final   Streptococcus pneumoniae NOT DETECTED NOT DETECTED Final   Streptococcus pyogenes NOT DETECTED NOT DETECTED Final   A.calcoaceticus-baumannii NOT DETECTED NOT DETECTED Final   Bacteroides fragilis NOT DETECTED NOT DETECTED Final   Enterobacterales NOT DETECTED NOT DETECTED Final   Enterobacter cloacae complex NOT DETECTED NOT DETECTED  Final   Escherichia coli NOT DETECTED NOT DETECTED Final   Klebsiella aerogenes NOT DETECTED NOT DETECTED Final   Klebsiella oxytoca NOT DETECTED NOT DETECTED Final   Klebsiella pneumoniae NOT DETECTED NOT DETECTED Final   Proteus species NOT DETECTED NOT DETECTED Final   Salmonella species NOT DETECTED NOT DETECTED Final   Serratia marcescens NOT DETECTED NOT DETECTED Final   Haemophilus influenzae NOT DETECTED NOT DETECTED Final   Neisseria meningitidis NOT DETECTED NOT DETECTED Final   Pseudomonas aeruginosa  NOT DETECTED NOT DETECTED Final   Stenotrophomonas maltophilia NOT DETECTED NOT DETECTED Final   Candida albicans NOT DETECTED NOT DETECTED Final   Candida auris NOT DETECTED NOT DETECTED Final   Candida glabrata NOT DETECTED NOT DETECTED Final   Candida krusei NOT DETECTED NOT DETECTED Final   Candida parapsilosis NOT DETECTED NOT DETECTED Final   Candida tropicalis NOT DETECTED NOT DETECTED Final   Cryptococcus neoformans/gattii NOT DETECTED NOT DETECTED Final   Vancomycin  resistance DETECTED (A) NOT DETECTED Final    Comment: CRITICAL RESULT CALLED TO, READ BACK BY AND VERIFIED WITH: Alycia Babcock A [id number] FCP   Performed at [facility], [address], [place], [place] [address]   MIC (1 Drug)-blood cultures; 11/27/2023; Left Antecubital; enterococcus faecium; Daptomycin ; Patient immune status: Normal     Status: None   Collection Time: 11/27/23  9:33 AM   Specimen: Left Antecubital  Result Value Ref Range Status   Min Inhibitory Conc (1 Drug) Preliminary report  Final    Comment: (NOTE) Performed At: [facility] [address] [place], [place] [id number] Pearlean Botts MD XB:[id number]    Source BLOOD  Final    Comment: Performed at [facility], [address], [place], [place] [address]  MIC Result     Status: None   Collection Time: 11/27/23  9:33 AM  Result Value Ref Range Status   Result 1 (MIC) Enterococcus faecium  Final    Comment: (NOTE) Identification performed by account, not confirmed by this laboratory. DAPTOMYCIN  Performed At: [facility] [address] [place], [place] [id number] Pearlean Botts MD MW:[id number]   Blood Culture (routine x 2)     Status: Abnormal   Collection Time: 11/27/23 10:23 AM   Specimen: BLOOD  Result Value Ref Range Status   Specimen Description BLOOD RIGHT ANTECUBITAL  Final   Special Requests   Final    BOTTLES DRAWN AEROBIC AND ANAEROBIC Blood Culture results may not be optimal due to an  inadequate volume of blood received in culture bottles   Culture  Setup Time   Final    GRAM POSITIVE COCCI IN BOTH AEROBIC AND ANAEROBIC BOTTLES CRITICAL VALUE NOTED.  VALUE IS CONSISTENT WITH PREVIOUSLY REPORTED AND CALLED VALUE.    Culture (A)  Final    ENTEROCOCCUS FAECIUM SUSCEPTIBILITIES PERFORMED ON PREVIOUS CULTURE WITHIN THE LAST 5 DAYS. Performed at [facility], [address] [place], [place] [address]    Report Status 11/30/2023 FINAL  Final  MRSA Next Gen by PCR, Nasal     Status: None   Collection Time: 11/28/23  5:52 AM   Specimen: Nasal Mucosa; Nasal Swab  Result Value Ref Range Status   MRSA by PCR Next Gen NOT DETECTED NOT DETECTED Final    Comment: (NOTE) The GeneXpert MRSA Assay (FDA approved for NASAL specimens only), is one component of a comprehensive MRSA colonization surveillance program. It is not intended to diagnose MRSA infection nor to guide or  monitor treatment for MRSA infections. Test performance is not FDA approved in patients less than 29 years old. Performed at [facility], [address] [place], [place] [address]   Culture, blood (Routine X 2) w Reflex to ID Panel     Status: None   Collection Time: 11/29/23  5:16 AM   Specimen: BLOOD LEFT ARM  Result Value Ref Range Status   Specimen Description BLOOD LEFT ARM  Final   Special Requests   Final    BOTTLES DRAWN AEROBIC AND ANAEROBIC Blood Culture adequate volume   Culture   Final    NO GROWTH 5 DAYS Performed at [facility], [address], [place], [place] [address]    Report Status 12/04/2023 FINAL  Final  Culture, blood (Routine X 2) w Reflex to ID Panel     Status: None   Collection Time: 11/29/23  5:16 AM   Specimen: BLOOD RIGHT HAND  Result Value Ref Range Status   Specimen Description BLOOD RIGHT HAND  Final   Special Requests   Final    BOTTLES DRAWN AEROBIC AND ANAEROBIC Blood Culture adequate volume   Culture   Final    NO GROWTH 5 DAYS Performed at  [facility], [address] [place], [place] [address]    Report Status 12/04/2023 FINAL  Final  SARS Coronavirus 2 by RT PCR (hospital order, performed in [facility] hospital lab) *cepheid single result test* Anterior Nasal Swab     Status: Abnormal   Collection Time: 12/03/23  8:19 AM   Specimen: Anterior Nasal Swab  Result Value Ref Range Status   SARS Coronavirus 2 by RT PCR POSITIVE (A) NEGATIVE Final    Comment: Performed at [facility], [address], [place], [place] [address]    Radiology Report No results found.    Signature  -   Lynnwood Sauer M.D on 12/04/2023 at 7:21 AM   -  To page go to [email or web address]

## 2023-12-04 NOTE — Plan of Care (Signed)
  Problem: Coping: Goal: Ability to adjust to condition or change in health will improve Outcome: Progressing   Problem: Skin Integrity: Goal: Risk for impaired skin integrity will decrease Outcome: Progressing   Problem: Clinical Measurements: Goal: Will remain free from infection Outcome: Progressing Goal: Respiratory complications will improve Outcome: Progressing   Problem: Pain Managment: Goal: General experience of comfort will improve and/or be controlled Outcome: Progressing

## 2023-12-04 NOTE — Progress Notes (Signed)
 [facility]  Date of Admission:  11/27/2023     Reason for Follow Up: COVID-19 virus infection  Total days of antibiotics 8         ASSESSMENT:  [name] blood cultures from 11/29/2023 have finalized without growth to date indicating clearance of bacteremia in the setting of vancomycin  resistant Enterococcus faecium complicated by the presence of transcatheter aortic valve replacement as well as subsequent finding of COVID.  COVID testing positive with CT value of 21 and will need to remain on standard/airborne precautions.  Currently on supportive care for COVID as needed per internal medicine.  Awaiting TEE to evaluate for prosthetic valve endocarditis and can likely receive TEE on 5/1.  Reviewed plan of care to continue current dose of daptomycin  and ampicillin  for presumed endocarditis.  Therapeutic drug monitoring of CK levels while on daptomycin  with most recent being 19 on 12/01/2023.  Duration of treatment to be determined by TEE.  Remaining medical and supportive care per internal medicine.  PLAN:  Continue current dose of daptomycin  and ampicillin . Therapeutic drug monitoring of CK levels while on daptomycin . TEE pending secondary to COVID. Continue supportive care for COVID. Continue airborne and contact precautions. Remaining medical and supportive care per internal medicine.  Principal Problem:   COVID-19 virus infection Active Problems:   OSA (obstructive sleep apnea)   Type 2 diabetes mellitus with obesity (HCC)   Chronic diastolic CHF (congestive heart failure) (HCC)   Seizure disorder (HCC)   AF (paroxysmal atrial fibrillation) (HCC)   COPD (chronic obstructive pulmonary disease) (HCC)   S/P TAVR (transcatheter aortic valve replacement)   Hypotension   Obesity, Class III, BMI 40-49.9 (morbid obesity) (HCC)   SIRS (systemic inflammatory response syndrome) (HCC)   Fall from wheelchair   Hyperlipidemia    apixaban   5 mg Oral BID    ARIPiprazole   5 mg Oral QHS   busPIRone   10 mg Oral TID   colchicine   0.6 mg Oral BID   cyanocobalamin   1,000 mcg Oral Daily   fenofibrate   160 mg Oral Daily   fluticasone   2 spray Each Nare Daily   guaiFENesin   600 mg Oral BID   insulin  aspart  0-5 Units Subcutaneous QHS   insulin  aspart  0-9 Units Subcutaneous TID WC   lacosamide   150 mg Oral BID   levETIRAcetam   500 mg Oral BID   mirtazapine   15 mg Oral QHS   prazosin   2 mg Oral QHS   pregabalin   150 mg Oral TID   repaglinide   2 mg Oral BID   rOPINIRole   0.25 mg Oral BID   sodium chloride  flush  3 mL Intravenous Q12H   spironolactone   50 mg Oral Daily   torsemide   20 mg Oral Daily   traZODone   200 mg Oral QHS   umeclidinium bromide   1 puff Inhalation Daily   venlafaxine  XR  150 mg Oral Q breakfast    SUBJECTIVE:  Afebrile overnight with no acute events. Some fatigue but otherwise doing okay. No fevers or chills. Tolerating antibiotics with no adverse side effects. Has questions about the plan of care.   Allergies  Allergen Reactions   Niacin And Related Other (See Comments)    Redness, flushing   Ppd [Tuberculin Purified Protein Derivative]     Unknown rxn (MAR)   Tubersol [Tuberculin, Ppd] Other (See Comments)    Unknown rxn (MAR)   Firvanq  [Vancomycin ] Other (See Comments)    Red man syndrome  Vibramycin  [Doxycycline ] Rash     Review of Systems: Review of Systems  Constitutional:  Negative for chills, fever and weight loss.  Respiratory:  Negative for cough, shortness of breath and wheezing.   Cardiovascular:  Negative for chest pain and leg swelling.  Gastrointestinal:  Negative for abdominal pain, constipation, diarrhea, nausea and vomiting.  Skin:  Negative for rash.      OBJECTIVE: Vitals:   12/04/23 0043 12/04/23 0418 12/04/23 0500 12/04/23 0907  BP:  (!) 141/87  114/65  Pulse: 74 69  68  Resp: 15 17  13   Temp:  (!) 97.2 F (36.2 C)    TempSrc:  Oral    SpO2: 96% 95%  95%  Weight:   (!) 160 kg    Height:       Body mass index is 45.29 kg/m.  Physical Exam Constitutional:      General: He is not in acute distress.    Appearance: He is well-developed. He is obese.  Cardiovascular:     Rate and Rhythm: Normal rate and regular rhythm.     Heart sounds: Normal heart sounds.  Pulmonary:     Effort: Pulmonary effort is normal.     Breath sounds: Normal breath sounds.  Skin:    General: Skin is warm and dry.  Neurological:     Mental Status: He is alert and oriented to person, place, and time.  Psychiatric:        Mood and Affect: Mood normal.     Lab Results Lab Results  Component Value Date   WBC 6.6 12/03/2023   HGB 12.6 (L) 12/03/2023   HCT 38.8 (L) 12/03/2023   MCV 91.7 12/03/2023   PLT 224 12/03/2023    Lab Results  Component Value Date   CREATININE 0.87 12/03/2023   BUN 13 12/03/2023   NA 140 12/03/2023   K 3.5 12/03/2023   CL 105 12/03/2023   CO2 25 12/03/2023    Lab Results  Component Value Date   ALT 25 11/27/2023   AST 25 11/27/2023   ALKPHOS 43 11/27/2023   BILITOT 0.9 11/27/2023     Microbiology: Recent Results (from the past 240 hours)  Resp panel by RT-PCR (RSV, Flu A&B, Covid) Anterior Nasal Swab     Status: Abnormal   Collection Time: 11/27/23  9:33 AM   Specimen: Anterior Nasal Swab  Result Value Ref Range Status   SARS Coronavirus 2 by RT PCR POSITIVE (A) NEGATIVE Final   Influenza A by PCR NEGATIVE NEGATIVE Final   Influenza B by PCR NEGATIVE NEGATIVE Final    Comment: (NOTE) The Xpert Xpress SARS-CoV-2/FLU/RSV plus assay is intended as an aid in the diagnosis of influenza from Nasopharyngeal swab specimens and should not be used as a sole basis for treatment. Nasal washings and aspirates are unacceptable for Xpert Xpress SARS-CoV-2/FLU/RSV testing.  Fact Sheet for Patients: BloggerCourse.com  Fact Sheet for Healthcare Providers: SeriousBroker.it  This test is not yet  approved or cleared by the [place]  FDA and has been authorized for detection and/or diagnosis of SARS-CoV-2 by FDA under an Emergency Use Authorization (EUA). This EUA will remain in effect (meaning this test can be used) for the duration of the COVID-19 declaration under Section 564(b)(1) of the Act, 21 U.S.C. section 360bbb-3(b)(1), unless the authorization is terminated or revoked.     Resp Syncytial Virus by PCR NEGATIVE NEGATIVE Final    Comment: (NOTE) Fact Sheet for Patients: BloggerCourse.com  Fact Sheet for Healthcare Providers:  SeriousBroker.it  This test is not yet approved or cleared by the [place]  FDA and has been authorized for detection and/or diagnosis of SARS-CoV-2 by FDA under an Emergency Use Authorization (EUA). This EUA will remain in effect (meaning this test can be used) for the duration of the COVID-19 declaration under Section 564(b)(1) of the Act, 21 U.S.C. section 360bbb-3(b)(1), unless the authorization is terminated or revoked.  Performed at [facility], [address], [place], [place] [address]   Blood Culture (routine x 2)     Status: Abnormal   Collection Time: 11/27/23  9:33 AM   Specimen: BLOOD  Result Value Ref Range Status   Specimen Description BLOOD LEFT ANTECUBITAL  Final   Special Requests   Final    BOTTLES DRAWN AEROBIC AND ANAEROBIC Blood Culture results may not be optimal due to an inadequate volume of blood received in culture bottles   Culture  Setup Time   Final    GRAM POSITIVE COCCI IN BOTH AEROBIC AND ANAEROBIC BOTTLES CRITICAL RESULT CALLED TO, READ BACK BY AND VERIFIED WITH: PHARMD GREG A [id number] FCP    Culture (A)  Final    ENTEROCOCCUS FAECIUM VANCOMYCIN  RESISTANT ENTEROCOCCUS SEE SEPARATE REPORT Performed at [facility], [address], [place], [place] [address]    Report Status 12/03/2023 FINAL  Final   Organism ID, Bacteria  ENTEROCOCCUS FAECIUM  Final      Susceptibility   Enterococcus faecium - MIC*    AMPICILLIN  >=32 RESISTANT Resistant     VANCOMYCIN  >=32 RESISTANT Resistant     GENTAMICIN SYNERGY SENSITIVE Sensitive     * ENTEROCOCCUS FAECIUM  Blood Culture ID Panel (Reflexed)     Status: Abnormal   Collection Time: 11/27/23  9:33 AM  Result Value Ref Range Status   Enterococcus faecalis NOT DETECTED NOT DETECTED Final   Enterococcus Faecium DETECTED (A) NOT DETECTED Final    Comment: CRITICAL RESULT CALLED TO, READ BACK BY AND VERIFIED WITH: PHARMD GREG A [id number] FCP      Listeria monocytogenes NOT DETECTED NOT DETECTED Final   Staphylococcus species NOT DETECTED NOT DETECTED Final   Staphylococcus aureus (BCID) NOT DETECTED NOT DETECTED Final   Staphylococcus epidermidis NOT DETECTED NOT DETECTED Final   Staphylococcus lugdunensis NOT DETECTED NOT DETECTED Final   Streptococcus species NOT DETECTED NOT DETECTED Final   Streptococcus agalactiae NOT DETECTED NOT DETECTED Final   Streptococcus pneumoniae NOT DETECTED NOT DETECTED Final   Streptococcus pyogenes NOT DETECTED NOT DETECTED Final   A.calcoaceticus-baumannii NOT DETECTED NOT DETECTED Final   Bacteroides fragilis NOT DETECTED NOT DETECTED Final   Enterobacterales NOT DETECTED NOT DETECTED Final   Enterobacter cloacae complex NOT DETECTED NOT DETECTED Final   Escherichia coli NOT DETECTED NOT DETECTED Final   Klebsiella aerogenes NOT DETECTED NOT DETECTED Final   Klebsiella oxytoca NOT DETECTED NOT DETECTED Final   Klebsiella pneumoniae NOT DETECTED NOT DETECTED Final   Proteus species NOT DETECTED NOT DETECTED Final   Salmonella species NOT DETECTED NOT DETECTED Final   Serratia marcescens NOT DETECTED NOT DETECTED Final   Haemophilus influenzae NOT DETECTED NOT DETECTED Final   Neisseria meningitidis NOT DETECTED NOT DETECTED Final   Pseudomonas aeruginosa NOT DETECTED NOT DETECTED Final   Stenotrophomonas maltophilia NOT DETECTED  NOT DETECTED Final   Candida albicans NOT DETECTED NOT DETECTED Final   Candida auris NOT DETECTED NOT DETECTED Final   Candida glabrata NOT DETECTED NOT DETECTED Final   Candida krusei  NOT DETECTED NOT DETECTED Final   Candida parapsilosis NOT DETECTED NOT DETECTED Final   Candida tropicalis NOT DETECTED NOT DETECTED Final   Cryptococcus neoformans/gattii NOT DETECTED NOT DETECTED Final   Vancomycin  resistance DETECTED (A) NOT DETECTED Final    Comment: CRITICAL RESULT CALLED TO, READ BACK BY AND VERIFIED WITH: Alycia Babcock A [id number] FCP   Performed at [facility], [address] [place], [place] [address]   MIC (1 Drug)-blood cultures; 11/27/2023; Left Antecubital; enterococcus faecium; Daptomycin ; Patient immune status: Normal     Status: None   Collection Time: 11/27/23  9:33 AM   Specimen: Left Antecubital  Result Value Ref Range Status   Min Inhibitory Conc (1 Drug) Preliminary report  Final    Comment: (NOTE) Performed At: [facility] [address] [place], [place] [id number] Pearlean Botts MD XB:[id number]    Source BLOOD  Final    Comment: Performed at [facility], [address] [place], [place] [address]  MIC Result     Status: None   Collection Time: 11/27/23  9:33 AM  Result Value Ref Range Status   Result 1 (MIC) Enterococcus faecium  Final    Comment: (NOTE) Identification performed by account, not confirmed by this laboratory. DAPTOMYCIN  Performed At: [facility] [address] [place], [place] [id number] Pearlean Botts MD MW:[id number]   Blood Culture (routine x 2)     Status: Abnormal   Collection Time: 11/27/23 10:23 AM   Specimen: BLOOD  Result Value Ref Range Status   Specimen Description BLOOD RIGHT ANTECUBITAL  Final   Special Requests   Final    BOTTLES DRAWN AEROBIC AND ANAEROBIC Blood Culture results may not be optimal due to an inadequate volume of blood received in culture bottles   Culture  Setup Time    Final    GRAM POSITIVE COCCI IN BOTH AEROBIC AND ANAEROBIC BOTTLES CRITICAL VALUE NOTED.  VALUE IS CONSISTENT WITH PREVIOUSLY REPORTED AND CALLED VALUE.    Culture (A)  Final    ENTEROCOCCUS FAECIUM SUSCEPTIBILITIES PERFORMED ON PREVIOUS CULTURE WITHIN THE LAST 5 DAYS. Performed at [facility], [address] [place], [place] [address]    Report Status 11/30/2023 FINAL  Final  MRSA Next Gen by PCR, Nasal     Status: None   Collection Time: 11/28/23  5:52 AM   Specimen: Nasal Mucosa; Nasal Swab  Result Value Ref Range Status   MRSA by PCR Next Gen NOT DETECTED NOT DETECTED Final    Comment: (NOTE) The GeneXpert MRSA Assay (FDA approved for NASAL specimens only), is one component of a comprehensive MRSA colonization surveillance program. It is not intended to diagnose MRSA infection nor to guide or monitor treatment for MRSA infections. Test performance is not FDA approved in patients less than 17 years old. Performed at [facility], [address], [place], [place] [address]   Culture, blood (Routine X 2) w Reflex to ID Panel     Status: None   Collection Time: 11/29/23  5:16 AM   Specimen: BLOOD LEFT ARM  Result Value Ref Range Status   Specimen Description BLOOD LEFT ARM  Final   Special Requests   Final    BOTTLES DRAWN AEROBIC AND ANAEROBIC Blood Culture adequate volume   Culture   Final    NO GROWTH 5 DAYS Performed at [facility], [address] [place], [place] [address]    Report Status 12/04/2023 FINAL  Final  Culture, blood (Routine X 2) w Reflex to ID Panel     Status: None   Collection Time: 11/29/23  5:16 AM   Specimen: BLOOD RIGHT HAND  Result Value Ref Range Status   Specimen Description BLOOD RIGHT HAND  Final   Special Requests   Final    BOTTLES DRAWN AEROBIC AND ANAEROBIC Blood Culture adequate volume   Culture   Final    NO GROWTH 5 DAYS Performed at [facility], [address] [place], [place] [address]    Report  Status 12/04/2023 FINAL  Final  SARS Coronavirus 2 by RT PCR (hospital order, performed in [facility] hospital lab) *cepheid single result test* Anterior Nasal Swab     Status: Abnormal   Collection Time: 12/03/23  8:19 AM   Specimen: Anterior Nasal Swab  Result Value Ref Range Status   SARS Coronavirus 2 by RT PCR POSITIVE (A) NEGATIVE Final    Comment: Performed at [facility], [address] [place], [place] [address]     Marlan Silva, NP [facility]  12/04/2023  1:03 PM

## 2023-12-05 ENCOUNTER — Ambulatory Visit: Admitting: Professional Counselor

## 2023-12-05 DIAGNOSIS — U071 COVID-19: Secondary | ICD-10-CM | POA: Diagnosis not present

## 2023-12-05 LAB — CBC WITH DIFFERENTIAL/PLATELET
Abs Immature Granulocytes: 0.15 10*3/uL — ABNORMAL HIGH (ref 0.00–0.07)
Basophils Absolute: 0.1 10*3/uL (ref 0.0–0.1)
Basophils Relative: 1 %
Eosinophils Absolute: 0.5 10*3/uL (ref 0.0–0.5)
Eosinophils Relative: 7 %
HCT: 39.5 % (ref 39.0–52.0)
Hemoglobin: 13.3 g/dL (ref 13.0–17.0)
Immature Granulocytes: 2 %
Lymphocytes Relative: 29 %
Lymphs Abs: 1.9 10*3/uL (ref 0.7–4.0)
MCH: 30.1 pg (ref 26.0–34.0)
MCHC: 33.7 g/dL (ref 30.0–36.0)
MCV: 89.4 fL (ref 80.0–100.0)
Monocytes Absolute: 0.5 10*3/uL (ref 0.1–1.0)
Monocytes Relative: 7 %
Neutro Abs: 3.6 10*3/uL (ref 1.7–7.7)
Neutrophils Relative %: 54 %
Platelets: 231 10*3/uL (ref 150–400)
RBC: 4.42 MIL/uL (ref 4.22–5.81)
RDW: 13.4 % (ref 11.5–15.5)
WBC: 6.7 10*3/uL (ref 4.0–10.5)
nRBC: 0 % (ref 0.0–0.2)

## 2023-12-05 LAB — BASIC METABOLIC PANEL WITH GFR
Anion gap: 8 (ref 5–15)
BUN: 14 mg/dL (ref 8–23)
CO2: 27 mmol/L (ref 22–32)
Calcium: 8.9 mg/dL (ref 8.9–10.3)
Chloride: 106 mmol/L (ref 98–111)
Creatinine, Ser: 1.01 mg/dL (ref 0.61–1.24)
GFR, Estimated: >60 mL/min (ref >60)
Glucose, Bld: 94 mg/dL (ref 70–99)
Potassium: 3.7 mmol/L (ref 3.5–5.1)
Sodium: 141 mmol/L (ref 135–145)

## 2023-12-05 LAB — MINIMUM INHIBITORY CONC. (1 DRUG)

## 2023-12-05 LAB — GLUCOSE, CAPILLARY
Glucose-Capillary: 145 mg/dL — ABNORMAL HIGH (ref 70–99)
Glucose-Capillary: 140 mg/dL — ABNORMAL HIGH (ref 70–99)
Glucose-Capillary: 86 mg/dL (ref 70–99)
Glucose-Capillary: 62 mg/dL — ABNORMAL LOW (ref 70–99)
Glucose-Capillary: 98 mg/dL (ref 70–99)

## 2023-12-05 LAB — MIC RESULT

## 2023-12-05 LAB — CK: Total CK: 22 U/L — ABNORMAL LOW (ref 49–397)

## 2023-12-05 LAB — MAGNESIUM: Magnesium: 2 mg/dL (ref 1.7–2.4)

## 2023-12-05 NOTE — Progress Notes (Signed)
 Pt did not show for scheduled appt due to being in hospital. Pt called.  Lawrence [name], [facility]

## 2023-12-05 NOTE — Progress Notes (Signed)
 Occupational Therapy Treatment Patient Details Name: Lawrence [name] MRN: [id number] DOB: Dec 13, 1955 Today's Date: 12/05/2023   History of present illness Pt is a 68 y/o M admitted on 11/27/23 after presenting with c/o unwitnessed fall out of w/c. Pt tested positive for COVID-19. Pt also found to have acute hypotension. Of note, pt with recent admission 3/14-3/19/25 for ARF with hypoxia 2/2 Flu A, PNA & COPD exacerbation. PMH: COPD, aortic stenosis s/p TAVR July 2024, seizure disorder, chronic diastolic CHF, a-fib on Eliquis , CVA, gout, anxiety, BPH, chronic venous statis, morbid obesity, depression, neuropathy, HTN, PTSD, DM2   OT comments  Pt remains eager to work with therapies. Guided pt in ADLs at sink via [place] w/ good carryover. Pt requires Mod A x 2 for safety for multiple stands in [place] w/ cues for optimal posture. Provided UE HEP with level 2-3 theraband with pt familiar with these exercises. Reinforced other LE exercises EOB and bed level. Plan to progress weight shifting and lifting feet in [place] as pt able.       If plan is discharge home, recommend the following:  A lot of help with walking and/or transfers;Two people to help with walking and/or transfers;A lot of help with bathing/dressing/bathroom;Two people to help with bathing/dressing/bathroom   Equipment Recommendations  None recommended by OT    Recommendations for Other Services      Precautions / Restrictions Precautions Precautions: Fall Restrictions Weight Bearing Restrictions Per Provider Order: No       Mobility Bed Mobility Overal bed mobility: Needs Assistance Bed Mobility: Supine to Sit, Sit to Supine     Supine to sit: Supervision, HOB elevated, Used rails Sit to supine: Supervision        Transfers Overall transfer level: Needs assistance Equipment used: Ambulation equipment used Transfers: Sit to/from Stand Sit to Stand: Mod assist, +2 physical assistance, +2 safety/equipment, From elevated  surface           General transfer comment: Mod A x 2 to stand in [place] from elevated bed, able to stand with Min-Mod A x2 from [place] pads at sink and lighter Mod A x 2 from final stand at bedside. Pt able to stand statically in [place] fully upright for 30 seconds     Balance Overall balance assessment: Needs assistance Sitting-balance support: Feet supported, No upper extremity supported Sitting balance-Leahy Scale: Fair     Standing balance support: During functional activity, Reliant on assistive device for balance Standing balance-Leahy Scale: Zero                             ADL either performed or assessed with clinical judgement   ADL Overall ADL's : Needs assistance/impaired     Grooming: Oral care Grooming Details (indicate cue type and reason): Used stedy to perform brushing teeth and mouth wash seated and standing in [place] w/ pt eager to work on standing/WB through [employer] ADL Comments: Emphasis on UE HEP initiation, standing in [place] with ADLs at sink and other exercises to manage EOB/in bed    Extremity/Trunk Assessment Upper Extremity Assessment Upper Extremity Assessment: Overall WFL for tasks assessed;Right hand dominant   Lower Extremity Assessment Lower Extremity Assessment: Defer to PT evaluation        Vision   Vision Assessment?:  No apparent visual deficits   Perception     Praxis     Communication Communication Communication: No apparent difficulties   Cognition Arousal: Alert Behavior During Therapy: WFL for tasks assessed/performed Cognition: No apparent impairments                               Following commands: Intact        Cueing   Cueing Techniques: Verbal cues  Exercises Exercises: General Upper Extremity, Other exercises General Exercises - Upper Extremity Shoulder Flexion: Strengthening, 5 reps, Both, Seated, Theraband Theraband Level (Shoulder  Flexion): Level 3 (Green) Shoulder Horizontal ABduction: Strengthening, Both, 5 reps, Seated, Theraband Theraband Level (Shoulder Horizontal Abduction): Level 3 (Green) Elbow Flexion: Strengthening, Both, 5 reps, Seated, Theraband Theraband Level (Elbow Flexion): Level 3 (Green) Elbow Extension: Strengthening, Both, 5 reps, Seated, Theraband Theraband Level (Elbow Extension): Level 3 (Green) Other Exercises Other Exercises: ankle pumps, seated marches, leg kicks EOB Other Exercises: flutter kicks bed level + BLE abduction    Shoulder Instructions       General Comments VSS on RA    Pertinent Vitals/ Pain       Pain Assessment Pain Assessment: Faces Faces Pain Scale: Hurts a little bit Pain Location: R knee in standing Pain Descriptors / Indicators: Guarding, Grimacing Pain Intervention(s): Monitored during session, Limited activity within patient's tolerance  Home Living                                          Prior Functioning/Environment              Frequency  Min 1X/week        Progress Toward Goals  OT Goals(current goals can now be found in the care plan section)  Progress towards OT goals: Progressing toward goals  Acute Rehab OT Goals Patient Stated Goal: be able to stand and walk OT Goal Formulation: With patient Time For Goal Achievement: 12/12/23 Potential to Achieve Goals: Good ADL Goals Pt Will Perform Lower Body Bathing: with min assist;sitting/lateral leans;bed level;with adaptive equipment Pt Will Perform Lower Body Dressing: with min assist;with adaptive equipment;sitting/lateral leans;bed level Pt/caregiver will Perform Home Exercise Program: Increased strength;Both right and left upper extremity;With theraband;Independently;With written HEP provided  Plan      Co-evaluation                 AM-PAC OT "6 Clicks" Daily Activity     Outcome Measure   Help from another person eating meals?: None Help from another  person taking care of personal grooming?: A Little Help from another person toileting, which includes using toliet, bedpan, or urinal?: Total Help from another person bathing (including washing, rinsing, drying)?: A Lot Help from another person to put on and taking off regular upper body clothing?: A Little Help from another person to put on and taking off regular lower body clothing?: Total 6 Click Score: 14    End of Session    OT Visit Diagnosis: Unsteadiness on feet (R26.81);Other abnormalities of gait and mobility (R26.89);Muscle weakness (generalized) (M62.81)   Activity Tolerance Patient tolerated treatment well   Patient Left in bed;with call bell/phone within reach;with bed alarm set   Nurse Communication Mobility status        Time: 1610-9604 OT Time Calculation (min): 43 min  Charges: OT General Charges $OT  Visit: 1 Visit OT Treatments $Self Care/Home Management : 8-22 mins $Therapeutic Activity: 8-22 mins $Therapeutic Exercise: 8-22 mins  Lawrence Pretty, OTR/L Acute Rehab Services Office: [phone]   Annabella Barr 12/05/2023, 2:08 PM

## 2023-12-05 NOTE — Progress Notes (Signed)
 Overnight - Hypoglycemic to 62. Does not require SSI with current CBG. Discontinued these orders. Also discontinued his Repaglinide . Treat per hypoglycemia protocol   Arnulfo Larch, MD  [facility]

## 2023-12-05 NOTE — Progress Notes (Signed)
 Pharmacy Antibiotic Note  Lawrence [name] is a 68 y.o. male admitted on 11/27/2023 with VRE bacteremia. Pharmacy has been consulted for Daptomycin  dosing.  Optimal Daptomycin  dosing for VRE is 10-12 mg/kg, given BMI 45 will utilize an adjusted body weight. Baseline CK 67 and has remained stable - today was 22. Atorvastatin  held on initiation to reduce rhabdo risk - discussed with TRH-MD.  Hx TAVR - will need TEE for eval for IE however delayed due to COVID. Ampicillin  has been added for synergy. Noted this synergy is proposed even when ampicillin -resistant.   Plan: - Continue Daptomycin  to 1100 mg IV every 24 hours (~10 mg/kg AdjBW) - Continue Ampicillin  2g IV every 4 hours - Hold atorvastatin  while on high-dose Daptomycin  for VRE - Will check CK 2x weekly - on Tues/Friday for now - Will continue to follow renal function, culture results, LOT, and antibiotic de-escalation plans   Height: 6\' 2"  (188 cm) Weight: (!) 160.5 kg (353 lb 13.4 oz) IBW/kg (Calculated) : 82.2  Temp (24hrs), Avg:97.8 F (36.6 C), Min:97.8 F (36.6 C), Max:97.8 F (36.6 C)  Recent Labs  Lab 11/30/23 0511 12/01/23 0442 12/02/23 0516 12/03/23 0559 12/05/23 0430  WBC 4.9 4.9 5.8 6.6 6.7  CREATININE 1.03 1.02 1.00 0.87 1.01    Estimated Creatinine Clearance: 115.5 mL/min (by C-G formula based on SCr of 1.01 mg/dL).    Allergies  Allergen Reactions   Niacin And Related Other (See Comments)    Redness, flushing   Ppd [Tuberculin Purified Protein Derivative]     Unknown rxn (MAR)   Tubersol [Tuberculin, Ppd] Other (See Comments)    Unknown rxn (MAR)   Firvanq  [Vancomycin ] Other (See Comments)    Red man syndrome   Vibramycin  [Doxycycline ] Rash    Antimicrobials this admission: Vancomycin  4/21 x 1 Zosyn  4/21 x 1 Daptomycin  4/22 >> - CK 67, 19, 22  Dose adjustments this admission:   Microbiology results: 4/21 COVID >> positive 4/21 BCx >> 4/4 GPC (BCID VRE) 4/22 MRSA PCR >> neg  Thank you  for allowing pharmacy to be a part of this patient's care.  Garland Junk, PharmD, BCPS, BCIDP Infectious Diseases Clinical Pharmacist 12/05/2023 8:16 AM   **Pharmacist phone directory can now be found on [email or web address] ([email or web address]).  Listed under [facility].

## 2023-12-05 NOTE — Progress Notes (Addendum)
 PROGRESS NOTE                                                                                                                                                                                                             Patient Demographics:    Lawrence [name], is a 68 y.o. male, DOB - [birth year], WUJ:[id number]  Outpatient Primary MD for the patient is Darnelle Elders, PA-C    LOS - 7  Admit date - 11/27/2023    Chief Complaint  Patient presents with   Level 2 FOT   Code Sepsis       Brief Narrative (HPI from H&P)    68 y.o. male with medical history significant of COPD, severe aortic stenosis status post TAVR in July 2024, seizure disorder, chronic diastolic CHF (follows Dr. Mitzie Anda), atrial fibrillation on Eliquis , CVA, gout, OSA on nighttime CPAP, anxiety, BPH, chronic venous stasis, and morbid obesity presents after having an unwitnessed fall out of his wheelchair.   Records note patient had just recently been hospitalized 3/14-3/19 for sepsis with acute respiratory failure with hypoxia secondary to influenza A with pneumonia and COPD exacerbation.   The patient, who normally uses a wheelchair for mobility, experienced a fall after leaning forward attempting to pick something off the floor with his pick-up stick and hit his head.  Denied any loss of consciousness.  He was found to be febrile was COVID-19 positive, his only complaint was right foot pain, subsequently his blood cultures came back positive for gram-positive cocci 2 out of 2 when he was admitted.   Subjective:   Patient in bed, appears comfortable, denies any headache, no fever, no chest pain or pressure, no shortness of breath , no abdominal pain. No new focal weakness.   Assessment  & Plan :   Sepsis, Incidental COVID-19 infection.  2 out of 2 blood culture positive for Enterococcus.  Source unclear, CT chest abdomen pelvis nonacute, his only complaint is  ongoing pain in his right foot which could be due to underlying gout, x-ray right foot, currently on IV Daptomycin  >> Ampicillin  12/01/23, ID following, appears nontoxic.  Continue supportive care with IV fluids and midodrine  for blood pressure, TTE stable, TEE after he finishes his COVID isolation.  Has underlying history of MR.  Monitor.  COVID test  repeated on 12/03/2023, +ve CT value -  is still 25.8, previous was 19, so definitely improving and trending in the right direction.   Status post TAVR No acute issues noted per last echocardiogram from 06/2023, kindly see echo above.  HX of gout, ongoing right foot pain for the last few weeks, twitching in the right leg.  Has history of gout and seizures, stable EEG, stable uric acid level stable, hold allopurinol , continue colchicine , even a trial of IV steroid on 11/28/2023, repeat on 11/29/2023, right foot pain and twitching considerably improved.  X-ray of the right foot nonacute.  Incidental 19 infection.  Acute but appears to be incidental.  No cough or shortness of breath, inflammatory markers stable, not a candidate for Paxlovid due to Eliquis .  Continue to monitor.   Hypotension Acute.  Initial blood pressures noted to be as low as 77/38.  Suspect secondary to acute infection.  Blood pressures improved with IV fluids. Blood pressure has stabilized on midodrine , challenge with low-dose diuretics and gentle down titration of midodrine .   Fall out of wheelchair - CT scan of the head and cervical spine did not note any acute abnormality and chronic changes as previously noted on previous MRI imaging from 3/3.  PT/OT to evaluate and treat.   COPD, without acute exacerbation at this time  Patient does not appear to have active wheezing on physical exam although decreased overall aeration. Albuterol  nebs as needed for shortness of breath/wheezing.   Paroxysmal atrial fibrillation, [place] vasc 2 score of greater than 3 on chronic anticoagulation  Patient  appears to be in sinus rhythm at this time. Continue Eliquis .  Not on rate controlling agents.   Chr. Diastolic congestive heart failure - has mild fluid overload which is chronic, on oral diuretic less than home dose and doing well.  Seizure disorder  - Continue Keppra  and lacosamide , has been having twitching in his right lower extremity for a few days, unremarkable EEG.   Hyperlipidemia  - Continue fenofibrate  and atorvastatin    Anxiety and depression - Continue trazodone , mirtazapine , buspirone , venlafaxine , and aripiprazole    Morbid obesity - OSA on CPAP  BMI 46.22 kg/m, follow-up with PCP weight loss, continue nighttime CPAP.   Diabetes mellitus type 2, with long-term use of insulin  -  Patient is on sliding scale insulin , Prandin , and Mounjaro .  Continue Prandin  & SSI  Lab Results  Component Value Date   HGBA1C 6.6 (H) 10/04/2023   CBG (last 3)  Recent Labs    12/04/23 1617 12/04/23 2113 12/05/23 0826  GLUCAP 169* 102* 145*          Condition - Extremely Guarded  Family Communication  :  called [name] [phone] 11/28/2023 at 10 AM message left, updated 12/02/23  Code Status :  DNR  Consults  :  ID, PCCM  PUD Prophylaxis :     Procedures  :     TEE   TTE 1. Left ventricular ejection fraction, by estimation, is 60 to 65%. The left ventricle has normal function. The left ventricle has no regional wall motion abnormalities. Left ventricular diastolic function could not be evaluated.  2. Right ventricular systolic function is normal. The right ventricular size is normal. Tricuspid regurgitation signal is inadequate for assessing PA pressure.  3. Moderate pericardial effusion. The pericardial effusion is circumferential.  4. The mitral valve is normal in structure. No evidence of mitral valve regurgitation. No evidence of mitral stenosis.  5. The aortic valve is normal in structure. Aortic valve regurgitation is not visualized. No aortic stenosis is  present. There is a  34 Medtronic CoreValve-Evolut Pro prosthetic (TAVR) valve present in the aortic position. Aortic valve area, by VTI measures 1.58 cm. Aortic valve mean gradient measures 19.0 mmHg. Aortic valve Vmax measures 2.63 m/s.  6. The inferior vena cava is dilated in size with <50% respiratory variability, suggesting right atrial pressure of 15 mmHg. Conclusion(s)/Recommendation(s): Images are not adequate to rule out vegetation. Recommend TEE for further evaluation.  EEG - non acute  CT head.  C-spine.    CT head: 1.  No evidence of an acute intracranial abnormality. 2. Forehead hematoma on the right. 3. Cerebral atrophy. 4. Paranasal sinus disease as described.   CT cervical spine: 1. No evidence of an acute cervical spine fracture. 2. Grade 1 anterolisthesis at C2-C3, C3-C4 and C4-C5. 3. Levocurvature of the cervical spine. 4. Cervical spondylosis as described within the body of the report. Multifactorial severe spinal canal stenosis at C3-C4 (as was demonstrated on the recent prior cervical spine MRI of 10/09/2023). Multilevel bony neural foraminal narrowing. 5. Erosions noted along the left C3-C4 facet joint, a finding which can be seen in setting of CPPD arthropathy. 6. 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  CT Chest, abdomen and pelvis-  1. No acute findings. 2. Aortic Atherosclerosis (ICD10-I70.0) and Emphysema (ICD10-J43.9). Coronary and systemic atherosclerosis. 3. Sigmoid colon diverticulosis. 4. Gluteal and upper thigh muscular atrophy with the exception of the hip adductor musculature. There is some paraspinal muscular atrophy and atrophy of the latissimus dorsi muscles. 5. Dorsal stimulator device noted electrodes at the T12-L1 level.      Disposition Plan  :    Status is: Inpatient   DVT Prophylaxis  :    Place TED hose Start: 11/28/23 1000 apixaban  (ELIQUIS ) tablet 5 mg     Lab Results  Component Value Date   PLT 231 12/05/2023    Diet :  Diet Order             Diet heart  healthy/carb modified Room service appropriate? Yes; Fluid consistency: Thin  Diet effective now                    Inpatient Medications  Scheduled Meds:  apixaban   5 mg Oral BID   ARIPiprazole   5 mg Oral QHS   busPIRone   10 mg Oral TID   colchicine   0.6 mg Oral BID   cyanocobalamin   1,000 mcg Oral Daily   fenofibrate   160 mg Oral Daily   fluticasone   2 spray Each Nare Daily   guaiFENesin   600 mg Oral BID   insulin  aspart  0-5 Units Subcutaneous QHS   insulin  aspart  0-9 Units Subcutaneous TID WC   lacosamide   150 mg Oral BID   levETIRAcetam   500 mg Oral BID   mirtazapine   15 mg Oral QHS   prazosin   2 mg Oral QHS   pregabalin   150 mg Oral TID   repaglinide   2 mg Oral BID   rOPINIRole   0.25 mg Oral BID   sodium chloride  flush  3 mL Intravenous Q12H   spironolactone   50 mg Oral Daily   torsemide   20 mg Oral Daily   traZODone   200 mg Oral QHS   umeclidinium bromide   1 puff Inhalation Daily   venlafaxine  XR  150 mg Oral Q breakfast   Continuous Infusions:  ampicillin  (OMNIPEN) IV 2 g (12/05/23 0703)   DAPTOmycin  1,100 mg (12/04/23 1429)   PRN Meds:.acetaminophen  **OR** acetaminophen , albuterol , bisacodyl , HYDROcodone -acetaminophen , Muscle Rub,  ondansetron  **OR** ondansetron  (ZOFRAN ) IV     Objective:   Vitals:   12/05/23 0200 12/05/23 0400 12/05/23 0500 12/05/23 0825  BP:    127/68  Pulse: 75 74  71  Resp: (!) 23 15    Temp:    97.7 F (36.5 C)  TempSrc:    Oral  SpO2: (!) 85% (!) 82%  92%  Weight:   (!) 160.5 kg   Height:        Wt Readings from Last 3 Encounters:  12/05/23 (!) 160.5 kg  10/25/23 (!) 150 kg  10/10/23 (!) 157 kg     Intake/Output Summary (Last 24 hours) at 12/05/2023 0832 Last data filed at 12/05/2023 0703 Gross per 24 hour  Intake --  Output 6000 ml  Net -6000 ml     Physical Exam  Awake Alert, No new F.N deficits, Normal affect Alexis.AT,PERRAL Supple Neck, No JVD,   Symmetrical Chest wall movement, Good air movement  bilaterally, CTAB RRR,No Gallops,Rubs or new Murmurs,  +ve B.Sounds, Abd Soft, No tenderness,   Trace edema, mild R foot tenderness, bilateral first MTP bunions    Data Review:    Recent Labs  Lab 11/30/23 0511 12/01/23 0442 12/02/23 0516 12/03/23 0559 12/05/23 0430  WBC 4.9 4.9 5.8 6.6 6.7  HGB 12.2* 12.6* 13.3 12.6* 13.3  HCT 37.1* 38.1* 39.9 38.8* 39.5  PLT 182 195 184 224 231  MCV 90.7 91.1 90.9 91.7 89.4  MCH 29.8 30.1 30.3 29.8 30.1  MCHC 32.9 33.1 33.3 32.5 33.7  RDW 13.2 13.3 13.3 13.6 13.4  LYMPHSABS 1.5 1.5 1.6 2.0 1.9  MONOABS 0.6 0.5 0.6 0.7 0.5  EOSABS 0.1 0.2 0.4 0.5 0.5  BASOSABS 0.0 0.0 0.0 0.0 0.1    Recent Labs  Lab 11/29/23 0516 11/30/23 0511 12/01/23 0442 12/02/23 0516 12/03/23 0559 12/05/23 0430  NA 140 141 141 141 140 141  K 3.8 3.7 3.3* 3.6 3.5 3.7  CL 108 106 104 104 105 106  CO2 24 26 26 28 25 27   ANIONGAP 8 9 11 9 10 8   GLUCOSE 129* 100* 76 85 95 94  BUN 15 18 17 16 13 14   CREATININE 0.84 1.03 1.02 1.00 0.87 1.01  CRP 4.4* 1.1* 0.6 0.5  --   --   PROCALCITON 0.17 <0.10 <0.10 <0.10  --   --   BNP 149.1* 144.4* 96.6 64.1  --   --   MG 2.2 2.0 2.0 2.0  --  2.0  CALCIUM  8.9 8.9 8.8* 8.7* 8.6* 8.9      Recent Labs  Lab 11/29/23 0516 11/30/23 0511 12/01/23 0442 12/02/23 0516 12/03/23 0559 12/05/23 0430  CRP 4.4* 1.1* 0.6 0.5  --   --   PROCALCITON 0.17 <0.10 <0.10 <0.10  --   --   BNP 149.1* 144.4* 96.6 64.1  --   --   MG 2.2 2.0 2.0 2.0  --  2.0  CALCIUM  8.9 8.9 8.8* 8.7* 8.6* 8.9    --------------------------------------------------------------------------------------------------------------- Lab Results  Component Value Date   CHOL 151 06/25/2023   HDL 30 (L) 06/25/2023   LDLCALC 79 06/25/2023   TRIG 209 (H) 06/25/2023   CHOLHDL 5.0 06/25/2023    Lab Results  Component Value Date   HGBA1C 6.6 (H) 10/04/2023      Micro Results Recent Results (from the past 240 hours)  Resp panel by RT-PCR (RSV, Flu A&B, Covid)  Anterior Nasal Swab     Status: Abnormal   Collection Time: 11/27/23  9:33 AM   Specimen: Anterior Nasal Swab  Result Value Ref Range Status   SARS Coronavirus 2 by RT PCR POSITIVE (A) NEGATIVE Final   Influenza A by PCR NEGATIVE NEGATIVE Final   Influenza B by PCR NEGATIVE NEGATIVE Final    Comment: (NOTE) The Xpert Xpress SARS-CoV-2/FLU/RSV plus assay is intended as an aid in the diagnosis of influenza from Nasopharyngeal swab specimens and should not be used as a sole basis for treatment. Nasal washings and aspirates are unacceptable for Xpert Xpress SARS-CoV-2/FLU/RSV testing.  Fact Sheet for Patients: BloggerCourse.com  Fact Sheet for Healthcare Providers: SeriousBroker.it  This test is not yet approved or cleared by the [place]  FDA and has been authorized for detection and/or diagnosis of SARS-CoV-2 by FDA under an Emergency Use Authorization (EUA). This EUA will remain in effect (meaning this test can be used) for the duration of the COVID-19 declaration under Section 564(b)(1) of the Act, 21 U.S.C. section 360bbb-3(b)(1), unless the authorization is terminated or revoked.     Resp Syncytial Virus by PCR NEGATIVE NEGATIVE Final    Comment: (NOTE) Fact Sheet for Patients: BloggerCourse.com  Fact Sheet for Healthcare Providers: SeriousBroker.it  This test is not yet approved or cleared by the [place]  FDA and has been authorized for detection and/or diagnosis of SARS-CoV-2 by FDA under an Emergency Use Authorization (EUA). This EUA will remain in effect (meaning this test can be used) for the duration of the COVID-19 declaration under Section 564(b)(1) of the Act, 21 U.S.C. section 360bbb-3(b)(1), unless the authorization is terminated or revoked.  Performed at [facility], [address] [place], [place] [address]   Blood Culture (routine x 2)      Status: Abnormal   Collection Time: 11/27/23  9:33 AM   Specimen: BLOOD  Result Value Ref Range Status   Specimen Description BLOOD LEFT ANTECUBITAL  Final   Special Requests   Final    BOTTLES DRAWN AEROBIC AND ANAEROBIC Blood Culture results may not be optimal due to an inadequate volume of blood received in culture bottles   Culture  Setup Time   Final    GRAM POSITIVE COCCI IN BOTH AEROBIC AND ANAEROBIC BOTTLES CRITICAL RESULT CALLED TO, READ BACK BY AND VERIFIED WITH: PHARMD GREG A [id number] FCP    Culture (A)  Final    ENTEROCOCCUS FAECIUM VANCOMYCIN  RESISTANT ENTEROCOCCUS SEE SEPARATE REPORT Performed at [facility], [address] [place], [place] [address]    Report Status 12/03/2023 FINAL  Final   Organism ID, Bacteria ENTEROCOCCUS FAECIUM  Final      Susceptibility   Enterococcus faecium - MIC*    AMPICILLIN  >=32 RESISTANT Resistant     VANCOMYCIN  >=32 RESISTANT Resistant     GENTAMICIN SYNERGY SENSITIVE Sensitive     * ENTEROCOCCUS FAECIUM  Blood Culture ID Panel (Reflexed)     Status: Abnormal   Collection Time: 11/27/23  9:33 AM  Result Value Ref Range Status   Enterococcus faecalis NOT DETECTED NOT DETECTED Final   Enterococcus Faecium DETECTED (A) NOT DETECTED Final    Comment: CRITICAL RESULT CALLED TO, READ BACK BY AND VERIFIED WITH: PHARMD GREG A [id number] FCP      Listeria monocytogenes NOT DETECTED NOT DETECTED Final   Staphylococcus species NOT DETECTED NOT DETECTED Final   Staphylococcus aureus (BCID) NOT DETECTED NOT DETECTED Final   Staphylococcus epidermidis NOT DETECTED NOT DETECTED Final   Staphylococcus lugdunensis NOT DETECTED NOT DETECTED Final  Streptococcus species NOT DETECTED NOT DETECTED Final   Streptococcus agalactiae NOT DETECTED NOT DETECTED Final   Streptococcus pneumoniae NOT DETECTED NOT DETECTED Final   Streptococcus pyogenes NOT DETECTED NOT DETECTED Final   A.calcoaceticus-baumannii NOT DETECTED NOT DETECTED Final    Bacteroides fragilis NOT DETECTED NOT DETECTED Final   Enterobacterales NOT DETECTED NOT DETECTED Final   Enterobacter cloacae complex NOT DETECTED NOT DETECTED Final   Escherichia coli NOT DETECTED NOT DETECTED Final   Klebsiella aerogenes NOT DETECTED NOT DETECTED Final   Klebsiella oxytoca NOT DETECTED NOT DETECTED Final   Klebsiella pneumoniae NOT DETECTED NOT DETECTED Final   Proteus species NOT DETECTED NOT DETECTED Final   Salmonella species NOT DETECTED NOT DETECTED Final   Serratia marcescens NOT DETECTED NOT DETECTED Final   Haemophilus influenzae NOT DETECTED NOT DETECTED Final   Neisseria meningitidis NOT DETECTED NOT DETECTED Final   Pseudomonas aeruginosa NOT DETECTED NOT DETECTED Final   Stenotrophomonas maltophilia NOT DETECTED NOT DETECTED Final   Candida albicans NOT DETECTED NOT DETECTED Final   Candida auris NOT DETECTED NOT DETECTED Final   Candida glabrata NOT DETECTED NOT DETECTED Final   Candida krusei NOT DETECTED NOT DETECTED Final   Candida parapsilosis NOT DETECTED NOT DETECTED Final   Candida tropicalis NOT DETECTED NOT DETECTED Final   Cryptococcus neoformans/gattii NOT DETECTED NOT DETECTED Final   Vancomycin  resistance DETECTED (A) NOT DETECTED Final    Comment: CRITICAL RESULT CALLED TO, READ BACK BY AND VERIFIED WITH: Alycia Babcock A [id number] FCP   Performed at [facility], [address], [place], [place] [address]   MIC (1 Drug)-blood cultures; 11/27/2023; Left Antecubital; enterococcus faecium; Daptomycin ; Patient immune status: Normal     Status: None   Collection Time: 11/27/23  9:33 AM   Specimen: Left Antecubital  Result Value Ref Range Status   Min Inhibitory Conc (1 Drug) Preliminary report  Final    Comment: (NOTE) Performed At: [facility] [address] [place], [place] [id number] Pearlean Botts MD [id number]    Source BLOOD  Final    Comment: Performed at [facility], [address], [place], [place]  [address]  MIC Result     Status: None   Collection Time: 11/27/23  9:33 AM  Result Value Ref Range Status   Result 1 (MIC) Enterococcus faecium  Final    Comment: (NOTE) Identification performed by account, not confirmed by this laboratory. DAPTOMYCIN  Performed At: [facility] [address] [place], [place] [id number] Pearlean Botts MD KG:[id number]   Blood Culture (routine x 2)     Status: Abnormal   Collection Time: 11/27/23 10:23 AM   Specimen: BLOOD  Result Value Ref Range Status   Specimen Description BLOOD RIGHT ANTECUBITAL  Final   Special Requests   Final    BOTTLES DRAWN AEROBIC AND ANAEROBIC Blood Culture results may not be optimal due to an inadequate volume of blood received in culture bottles   Culture  Setup Time   Final    GRAM POSITIVE COCCI IN BOTH AEROBIC AND ANAEROBIC BOTTLES CRITICAL VALUE NOTED.  VALUE IS CONSISTENT WITH PREVIOUSLY REPORTED AND CALLED VALUE.    Culture (A)  Final    ENTEROCOCCUS FAECIUM SUSCEPTIBILITIES PERFORMED ON PREVIOUS CULTURE WITHIN THE LAST 5 DAYS. Performed at [facility], [address] [place], [place] [address]    Report Status 11/30/2023 FINAL  Final  MRSA Next Gen by PCR, Nasal     Status: None  Collection Time: 11/28/23  5:52 AM   Specimen: Nasal Mucosa; Nasal Swab  Result Value Ref Range Status   MRSA by PCR Next Gen NOT DETECTED NOT DETECTED Final    Comment: (NOTE) The GeneXpert MRSA Assay (FDA approved for NASAL specimens only), is one component of a comprehensive MRSA colonization surveillance program. It is not intended to diagnose MRSA infection nor to guide or monitor treatment for MRSA infections. Test performance is not FDA approved in patients less than 34 years old. Performed at [facility], [address] [place], [place] [address]   Culture, blood (Routine X 2) w Reflex to ID Panel     Status: None   Collection Time: 11/29/23  5:16 AM   Specimen: BLOOD LEFT ARM  Result Value Ref  Range Status   Specimen Description BLOOD LEFT ARM  Final   Special Requests   Final    BOTTLES DRAWN AEROBIC AND ANAEROBIC Blood Culture adequate volume   Culture   Final    NO GROWTH 5 DAYS Performed at [facility], [address] [place], [place] [address]    Report Status 12/04/2023 FINAL  Final  Culture, blood (Routine X 2) w Reflex to ID Panel     Status: None   Collection Time: 11/29/23  5:16 AM   Specimen: BLOOD RIGHT HAND  Result Value Ref Range Status   Specimen Description BLOOD RIGHT HAND  Final   Special Requests   Final    BOTTLES DRAWN AEROBIC AND ANAEROBIC Blood Culture adequate volume   Culture   Final    NO GROWTH 5 DAYS Performed at [facility], [address], [place], [place] [address]    Report Status 12/04/2023 FINAL  Final  SARS Coronavirus 2 by RT PCR (hospital order, performed in [facility] hospital lab) *cepheid single result test* Anterior Nasal Swab     Status: Abnormal   Collection Time: 12/03/23  8:19 AM   Specimen: Anterior Nasal Swab  Result Value Ref Range Status   SARS Coronavirus 2 by RT PCR POSITIVE (A) NEGATIVE Final    Comment: Performed at [facility], [address] [place], [place] [address]    Radiology Report No results found.    Signature  -   Lynnwood Sauer M.D on 12/05/2023 at 8:32 AM   -  To page go to [email or web address]

## 2023-12-05 NOTE — Progress Notes (Signed)
 Physical Therapy Treatment Patient Details Name: Lawrence [name] MRN: [id number] DOB: [birth year] Today's Date: 12/05/2023   History of Present Illness Pt is a 68 y/o M admitted on 11/27/23 after presenting with c/o unwitnessed fall out of w/c. Pt tested positive for COVID-19. Pt also found to have acute hypotension. Of note, pt with recent admission 3/14-3/19/25 for ARF with hypoxia 2/2 Flu A, PNA & COPD exacerbation. PMH: COPD, aortic stenosis s/p TAVR July 2024, seizure disorder, chronic diastolic CHF, a-fib on Eliquis , CVA, gout, anxiety, BPH, chronic venous statis, morbid obesity, depression, neuropathy, HTN, PTSD, DM2    PT Comments  Improved tolerance with standing today. Requires Stedy and Mod assist for boost to stand from highly elevated bed surface, BIL knees blocked due to buckling. Sit to stand multiple times. Stood up to 3 minutes on best attempt. Unable to transfer out of bed to recliner due to lack of equipment availability; requested XL maxi move lift pad order but out of stock apparently. Will continue to progress as tolerated. Patient will continue to benefit from skilled physical therapy services to further improve independence with functional mobility.     If plan is discharge home, recommend the following: Two people to help with walking and/or transfers;Two people to help with bathing/dressing/bathroom   Can travel by private vehicle     No  Equipment Recommendations  Other (comment) (defer to next venue)    Recommendations for Other Services       Precautions / Restrictions Precautions Precautions: Fall Restrictions Weight Bearing Restrictions Per Provider Order: No     Mobility  Bed Mobility Overal bed mobility: Needs Assistance Bed Mobility: Supine to Sit, Sit to Supine     Supine to sit: HOB elevated, Used rails, Contact guard Sit to supine: Contact guard assist   General bed mobility comments: CGA to rise to EOB with use of rails and cues. Returns to  supine with great effort but brought LEs into bed without physical assist today.    Transfers Overall transfer level: Needs assistance Equipment used: Ambulation equipment used Transfers: Sit to/from Stand Sit to Stand: Mod assist, Via lift equipment, From elevated surface           General transfer comment: Mod assist for boost to stand from high bed surface elevated to achieve full stand. Performed from bed x2, and from perched position on Stedy paddles x2. Max tolerance ~3 min in standing today with CGA, knees blocked by stedy pad or hyperextending to rest on ligaments. Unfortunately knees buckle rapidly when attempting to control unlocked knee position. Max cues for upright stance, glute and upper back squeeze. Transfer via Lift Equipment: Stedy  Ambulation/Gait                   Stairs             Wheelchair Mobility     Tilt Bed    Modified Rankin (Stroke Patients Only)       Balance Overall balance assessment: Needs assistance Sitting-balance support: Feet supported, No upper extremity supported Sitting balance-Leahy Scale: Fair     Standing balance support: During functional activity, Reliant on assistive device for balance Standing balance-Leahy Scale: Zero Standing balance comment: Stedy for knee block bil                            Communication Communication Communication: No apparent difficulties  Cognition Arousal: Alert Behavior During Therapy: WFL for tasks assessed/performed  PT - Cognitive impairments: No apparent impairments                         Following commands: Intact      Cueing Cueing Techniques: Verbal cues  Exercises      General Comments General comments (skin integrity, edema, etc.): VSS throughout on RA.      Pertinent Vitals/Pain Pain Assessment Pain Assessment: No/denies pain    Home Living                          Prior Function            PT Goals (current goals  can now be found in the care plan section) Acute Rehab PT Goals Patient Stated Goal: get stronger, walk again PT Goal Formulation: With patient Time For Goal Achievement: 12/12/23 Potential to Achieve Goals: Poor Progress towards PT goals: Progressing toward goals    Frequency    Min 2X/week      PT Plan      Co-evaluation              AM-PAC PT "6 Clicks" Mobility   Outcome Measure  Help needed turning from your back to your side while in a flat bed without using bedrails?: A Little Help needed moving from lying on your back to sitting on the side of a flat bed without using bedrails?: A Little Help needed moving to and from a bed to a chair (including a wheelchair)?: Total Help needed standing up from a chair using your arms (e.g., wheelchair or bedside chair)?: Total Help needed to walk in hospital room?: Total Help needed climbing 3-5 steps with a railing? : Total 6 Click Score: 10    End of Session Equipment Utilized During Treatment: Gait belt Activity Tolerance: Patient tolerated treatment well Patient left: with call bell/phone within reach;in bed;with bed alarm set (Bed in chair mode.) Nurse Communication:  ([employer] operational officer order XL maximove lift pad for patient room.) PT Visit Diagnosis: Muscle weakness (generalized) (M62.81);History of falling (Z91.81);Difficulty in walking, not elsewhere classified (R26.2);Unsteadiness on feet (R26.81)     Time: 1610-9604 PT Time Calculation (min) (ACUTE ONLY): 36 min  Charges:    $Therapeutic Activity: 23-37 mins PT General Charges $$ ACUTE PT VISIT: 1 Visit                     Jory Ng, PT, DPT [facility]  Rehabilitation Services Physical Therapist Office: [phone] Website: [email or web address]    Alinda Irani 12/05/2023, 2:04 PM

## 2023-12-06 DIAGNOSIS — U071 COVID-19: Secondary | ICD-10-CM | POA: Diagnosis not present

## 2023-12-06 LAB — CBC WITH DIFFERENTIAL/PLATELET
Abs Immature Granulocytes: 0.13 10*3/uL — ABNORMAL HIGH (ref 0.00–0.07)
Basophils Absolute: 0.1 10*3/uL (ref 0.0–0.1)
Basophils Relative: 1 %
Eosinophils Absolute: 0.5 10*3/uL (ref 0.0–0.5)
Eosinophils Relative: 7 %
HCT: 40.8 % (ref 39.0–52.0)
Hemoglobin: 13.4 g/dL (ref 13.0–17.0)
Immature Granulocytes: 2 %
Lymphocytes Relative: 35 %
Lymphs Abs: 2.5 10*3/uL (ref 0.7–4.0)
MCH: 30.1 pg (ref 26.0–34.0)
MCHC: 32.8 g/dL (ref 30.0–36.0)
MCV: 91.7 fL (ref 80.0–100.0)
Monocytes Absolute: 0.6 10*3/uL (ref 0.1–1.0)
Monocytes Relative: 9 %
Neutro Abs: 3.2 10*3/uL (ref 1.7–7.7)
Neutrophils Relative %: 46 %
Platelets: 231 10*3/uL (ref 150–400)
RBC: 4.45 MIL/uL (ref 4.22–5.81)
RDW: 13.7 % (ref 11.5–15.5)
WBC: 6.9 10*3/uL (ref 4.0–10.5)
nRBC: 0 % (ref 0.0–0.2)

## 2023-12-06 LAB — BASIC METABOLIC PANEL WITH GFR
Anion gap: 10 (ref 5–15)
BUN: 17 mg/dL (ref 8–23)
CO2: 24 mmol/L (ref 22–32)
Calcium: 8.5 mg/dL — ABNORMAL LOW (ref 8.9–10.3)
Chloride: 105 mmol/L (ref 98–111)
Creatinine, Ser: 0.96 mg/dL (ref 0.61–1.24)
GFR, Estimated: >60 mL/min (ref >60)
Glucose, Bld: 93 mg/dL (ref 70–99)
Potassium: 3.7 mmol/L (ref 3.5–5.1)
Sodium: 139 mmol/L (ref 135–145)

## 2023-12-06 LAB — GLUCOSE, CAPILLARY
Glucose-Capillary: 100 mg/dL — ABNORMAL HIGH (ref 70–99)
Glucose-Capillary: 124 mg/dL — ABNORMAL HIGH (ref 70–99)
Glucose-Capillary: 129 mg/dL — ABNORMAL HIGH (ref 70–99)
Glucose-Capillary: 135 mg/dL — ABNORMAL HIGH (ref 70–99)
Glucose-Capillary: 213 mg/dL — ABNORMAL HIGH (ref 70–99)
Glucose-Capillary: 90 mg/dL (ref 70–99)
Glucose-Capillary: 95 mg/dL (ref 70–99)

## 2023-12-06 LAB — MAGNESIUM: Magnesium: 2.1 mg/dL (ref 1.7–2.4)

## 2023-12-06 NOTE — Progress Notes (Signed)
 PROGRESS NOTE                                                                                                                                                                                                             Patient Demographics:    Lawrence [name], is a 68 y.o. male, DOB - 06-13-1956, AVW:[id number]  Outpatient Primary MD for the patient is Darnelle Elders, PA-C    LOS - 8  Admit date - 11/27/2023    Chief Complaint  Patient presents with   Level 2 FOT   Code Sepsis       Brief Narrative (HPI from H&P)    68 y.o. male with medical history significant of COPD, severe aortic stenosis status post TAVR in July 2024, seizure disorder, chronic diastolic CHF (follows Dr. Mitzie Anda), atrial fibrillation on Eliquis , CVA, gout, OSA on nighttime CPAP, anxiety, BPH, chronic venous stasis, and morbid obesity presents after having an unwitnessed fall out of his wheelchair.   Records note patient had just recently been hospitalized 3/14-3/19 for sepsis with acute respiratory failure with hypoxia secondary to influenza A with pneumonia and COPD exacerbation.   The patient, who normally uses a wheelchair for mobility, experienced a fall after leaning forward attempting to pick something off the floor with his pick-up stick and hit his head.  Denied any loss of consciousness.  He was found to be febrile was COVID-19 positive, his only complaint was right foot pain, subsequently his blood cultures came back positive for gram-positive cocci 2 out of 2 when he was admitted.   Subjective:   Patient in bed, appears comfortable, denies any headache, no fever, no chest pain or pressure, no shortness of breath , no abdominal pain. No new focal weakness.  Patient had hypoglycemia overnight, off sliding scale and repaglinide .   Assessment  & Plan :   Sepsis, Incidental COVID-19 infection.  2 out of 2 blood culture positive for Enterococcus.   Source unclear, CT chest abdomen pelvis nonacute, his only complaint is ongoing pain in his right foot which could be due to underlying gout, x-ray right foot, currently on IV Daptomycin  >> Ampicillin  12/01/23, ID following, appears nontoxic.  Continue supportive care with IV fluids and midodrine  for blood pressure, TTE stable, TEE after he finishes his COVID isolation.  Has underlying history of MR.  Monitor.  COVID test  repeated on 12/03/2023, +ve CT value - is still 25.8, previous was 19, so definitely improving and trending in the right direction. -Plan for TEE tomorrow.   Status post TAVR No acute issues noted per last echocardiogram from 06/2023, kindly see echo above.  HX of gout, ongoing right foot pain for the last few weeks, twitching in the right leg.  Has history of gout and seizures, stable EEG, stable uric acid level stable, hold allopurinol , continue colchicine , even a trial of IV steroid on 11/28/2023, repeat on 11/29/2023, right foot pain and twitching considerably improved.  X-ray of the right foot nonacute.  Incidental COVID 19 infection.  Acute but appears to be incidental.  No cough or shortness of breath, inflammatory markers stable, not a candidate for Paxlovid due to Eliquis .  Continue to monitor.   Hypotension Acute.  Initial blood pressures noted to be as low as 77/38.  Suspect secondary to acute infection.  Blood pressures improved with IV fluids. Blood pressure has stabilized on midodrine , challenge with low-dose diuretics and gentle down titration of midodrine .   Fall out of wheelchair - CT scan of the head and cervical spine did not note any acute abnormality and chronic changes as previously noted on previous MRI imaging from 3/3.  PT/OT to evaluate and treat.   COPD, without acute exacerbation at this time  Patient does not appear to have active wheezing on physical exam although decreased overall aeration. Albuterol  nebs as needed for shortness of breath/wheezing.    Paroxysmal atrial fibrillation, [place] vasc 2 score of greater than 3 on chronic anticoagulation  Patient appears to be in sinus rhythm at this time. Continue Eliquis .  Not on rate controlling agents.   Chr. Diastolic congestive heart failure - has mild fluid overload which is chronic, on oral diuretic less than home dose and doing well.  Seizure disorder  - Continue Keppra  and lacosamide , has been having twitching in his right lower extremity for a few days, unremarkable EEG.   Hyperlipidemia  - Continue fenofibrate  and atorvastatin     Anxiety and depression - Continue trazodone , mirtazapine , buspirone , venlafaxine , and aripiprazole    Morbid obesity - OSA on CPAP  BMI 46.22 kg/m, follow-up with PCP weight loss, continue nighttime CPAP. -Mounjaro  on hold   Diabetes mellitus type 2, with long-term use of insulin  -  -Hypoglycemia overnight, Prandin  and sliding scale has been discontinued Mounjaro  on hold  Lab Results  Component Value Date   HGBA1C 6.6 (H) 10/04/2023   CBG (last 3)  Recent Labs    12/06/23 0407 12/06/23 0810 12/06/23 1224  GLUCAP 90 129* 135*          Condition - Extremely Guarded  Family Communication  : None at bedside  Code Status :  DNR  Consults  :  ID, PCCM  PUD Prophylaxis :     Procedures  :     TEE   TTE 1. Left ventricular ejection fraction, by estimation, is 60 to 65%. The left ventricle has normal function. The left ventricle has no regional wall motion abnormalities. Left ventricular diastolic function could not be evaluated.  2. Right ventricular systolic function is normal. The right ventricular size is normal. Tricuspid regurgitation signal is inadequate for assessing PA pressure.  3. Moderate pericardial effusion. The pericardial effusion is circumferential.  4. The mitral valve is normal in structure. No evidence of mitral valve regurgitation. No evidence of mitral stenosis.  5. The aortic valve is normal in structure. Aortic valve  regurgitation is not visualized. No aortic stenosis is present. There is a 34 Medtronic CoreValve-Evolut Pro prosthetic (TAVR) valve present in the aortic position. Aortic valve area, by VTI measures 1.58 cm. Aortic valve mean gradient measures 19.0 mmHg. Aortic valve Vmax measures 2.63 m/s.  6. The inferior vena cava is dilated in size with <50% respiratory variability, suggesting right atrial pressure of 15 mmHg. Conclusion(s)/Recommendation(s): Images are not adequate to rule out vegetation. Recommend TEE for further evaluation.  EEG - non acute  CT head.  C-spine.    CT head: 1.  No evidence of an acute intracranial abnormality. 2. Forehead hematoma on the right. 3. Cerebral atrophy. 4. Paranasal sinus disease as described.   CT cervical spine: 1. No evidence of an acute cervical spine fracture. 2. Grade 1 anterolisthesis at C2-C3, C3-C4 and C4-C5. 3. Levocurvature of the cervical spine. 4. Cervical spondylosis as described within the body of the report. Multifactorial severe spinal canal stenosis at C3-C4 (as was demonstrated on the recent prior cervical spine MRI of 10/09/2023). Multilevel bony neural foraminal narrowing. 5. Erosions noted along the left C3-C4 facet joint, a finding which can be seen in setting of CPPD arthropathy. 6. 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  CT Chest, abdomen and pelvis-  1. No acute findings. 2. Aortic Atherosclerosis (ICD10-I70.0) and Emphysema (ICD10-J43.9). Coronary and systemic atherosclerosis. 3. Sigmoid colon diverticulosis. 4. Gluteal and upper thigh muscular atrophy with the exception of the hip adductor musculature. There is some paraspinal muscular atrophy and atrophy of the latissimus dorsi muscles. 5. Dorsal stimulator device noted electrodes at the T12-L1 level.      Disposition Plan  :    Status is: Inpatient   DVT Prophylaxis  :    Place TED hose Start: 11/28/23 1000 apixaban  (ELIQUIS ) tablet 5 mg     Lab Results  Component Value Date    PLT 231 12/06/2023    Diet :  Diet Order             Diet heart healthy/carb modified Room service appropriate? Yes; Fluid consistency: Thin  Diet effective now                    Inpatient Medications  Scheduled Meds:  apixaban   5 mg Oral BID   ARIPiprazole   5 mg Oral QHS   busPIRone   10 mg Oral TID   colchicine   0.6 mg Oral BID   cyanocobalamin   1,000 mcg Oral Daily   fenofibrate   160 mg Oral Daily   fluticasone   2 spray Each Nare Daily   guaiFENesin   600 mg Oral BID   lacosamide   150 mg Oral BID   levETIRAcetam   500 mg Oral BID   mirtazapine   15 mg Oral QHS   prazosin   2 mg Oral QHS   pregabalin   150 mg Oral TID   rOPINIRole   0.25 mg Oral BID   sodium chloride  flush  3 mL Intravenous Q12H   spironolactone   50 mg Oral Daily   torsemide   20 mg Oral Daily   traZODone   200 mg Oral QHS   umeclidinium bromide   1 puff Inhalation Daily   venlafaxine  XR  150 mg Oral Q breakfast   Continuous Infusions:  ampicillin  (OMNIPEN) IV 2 g (12/06/23 1050)   DAPTOmycin  1,100 mg (12/05/23 1412)   PRN Meds:.acetaminophen  **OR** acetaminophen , albuterol , bisacodyl , HYDROcodone -acetaminophen , Muscle Rub, ondansetron  **OR** ondansetron  (ZOFRAN ) IV     Objective:   Vitals:   12/06/23 0500 12/06/23 0810 12/06/23  0847 12/06/23 1220  BP:  115/76  107/68  Pulse:  69  65  Resp:  12  14  Temp:  97.9 F (36.6 C)  98.4 F (36.9 C)  TempSrc:  Oral  Oral  SpO2:  92% 96% 93%  Weight: (!) 160 kg     Height:        Wt Readings from Last 3 Encounters:  12/06/23 (!) 160 kg  10/25/23 (!) 150 kg  10/10/23 (!) 157 kg     Intake/Output Summary (Last 24 hours) at 12/06/2023 1259 Last data filed at 12/06/2023 0933 Gross per 24 hour  Intake --  Output 3300 ml  Net -3300 ml     Physical Exam    Awake Alert, Oriented X 3, frail, deconditioned Symmetrical Chest wall movement, Good air movement bilaterally, CTAB RRR,No Gallops,Rubs or new Murmurs, No Parasternal Heave +ve  B.Sounds, Abd Soft, No tenderness, No rebound - guarding or rigidity. Trace edema, both feet on floating boots     Data Review:    Recent Labs  Lab 12/01/23 0442 12/02/23 0516 12/03/23 0559 12/05/23 0430 12/06/23 0435  WBC 4.9 5.8 6.6 6.7 6.9  HGB 12.6* 13.3 12.6* 13.3 13.4  HCT 38.1* 39.9 38.8* 39.5 40.8  PLT 195 184 224 231 231  MCV 91.1 90.9 91.7 89.4 91.7  MCH 30.1 30.3 29.8 30.1 30.1  MCHC 33.1 33.3 32.5 33.7 32.8  RDW 13.3 13.3 13.6 13.4 13.7  LYMPHSABS 1.5 1.6 2.0 1.9 2.5  MONOABS 0.5 0.6 0.7 0.5 0.6  EOSABS 0.2 0.4 0.5 0.5 0.5  BASOSABS 0.0 0.0 0.0 0.1 0.1    Recent Labs  Lab 11/30/23 0511 12/01/23 0442 12/02/23 0516 12/03/23 0559 12/05/23 0430 12/06/23 0435  NA 141 141 141 140 141 139  K 3.7 3.3* 3.6 3.5 3.7 3.7  CL 106 104 104 105 106 105  CO2 26 26 28 25 27 24   ANIONGAP 9 11 9 10 8 10   GLUCOSE 100* 76 85 95 94 93  BUN 18 17 16 13 14 17   CREATININE 1.03 1.02 1.00 0.87 1.01 0.96  CRP 1.1* 0.6 0.5  --   --   --   PROCALCITON <0.10 <0.10 <0.10  --   --   --   BNP 144.4* 96.6 64.1  --   --   --   MG 2.0 2.0 2.0  --  2.0 2.1  CALCIUM  8.9 8.8* 8.7* 8.6* 8.9 8.5*      Recent Labs  Lab 11/30/23 0511 12/01/23 0442 12/02/23 0516 12/03/23 0559 12/05/23 0430 12/06/23 0435  CRP 1.1* 0.6 0.5  --   --   --   PROCALCITON <0.10 <0.10 <0.10  --   --   --   BNP 144.4* 96.6 64.1  --   --   --   MG 2.0 2.0 2.0  --  2.0 2.1  CALCIUM  8.9 8.8* 8.7* 8.6* 8.9 8.5*    --------------------------------------------------------------------------------------------------------------- Lab Results  Component Value Date   CHOL 151 06/25/2023   HDL 30 (L) 06/25/2023   LDLCALC 79 06/25/2023   TRIG 209 (H) 06/25/2023   CHOLHDL 5.0 06/25/2023    Lab Results  Component Value Date   HGBA1C 6.6 (H) 10/04/2023      Micro Results Recent Results (from the past 240 hours)  Resp panel by RT-PCR (RSV, Flu A&B, Covid) Anterior Nasal Swab     Status: Abnormal   Collection  Time: 11/27/23  9:33 AM   Specimen: Anterior Nasal Swab  Result Value Ref Range Status   SARS Coronavirus 2 by RT PCR POSITIVE (A) NEGATIVE Final   Influenza A by PCR NEGATIVE NEGATIVE Final   Influenza B by PCR NEGATIVE NEGATIVE Final    Comment: (NOTE) The Xpert Xpress SARS-CoV-2/FLU/RSV plus assay is intended as an aid in the diagnosis of influenza from Nasopharyngeal swab specimens and should not be used as a sole basis for treatment. Nasal washings and aspirates are unacceptable for Xpert Xpress SARS-CoV-2/FLU/RSV testing.  Fact Sheet for Patients: BloggerCourse.com  Fact Sheet for Healthcare Providers: SeriousBroker.it  This test is not yet approved or cleared by the [place]  FDA and has been authorized for detection and/or diagnosis of SARS-CoV-2 by FDA under an Emergency Use Authorization (EUA). This EUA will remain in effect (meaning this test can be used) for the duration of the COVID-19 declaration under Section 564(b)(1) of the Act, 21 U.S.C. section 360bbb-3(b)(1), unless the authorization is terminated or revoked.     Resp Syncytial Virus by PCR NEGATIVE NEGATIVE Final    Comment: (NOTE) Fact Sheet for Patients: BloggerCourse.com  Fact Sheet for Healthcare Providers: SeriousBroker.it  This test is not yet approved or cleared by the [place]  FDA and has been authorized for detection and/or diagnosis of SARS-CoV-2 by FDA under an Emergency Use Authorization (EUA). This EUA will remain in effect (meaning this test can be used) for the duration of the COVID-19 declaration under Section 564(b)(1) of the Act, 21 U.S.C. section 360bbb-3(b)(1), unless the authorization is terminated or revoked.  Performed at [facility], [address] [place], [place] [address]   Blood Culture (routine x 2)     Status: Abnormal   Collection Time: 11/27/23  9:33 AM    Specimen: BLOOD  Result Value Ref Range Status   Specimen Description BLOOD LEFT ANTECUBITAL  Final   Special Requests   Final    BOTTLES DRAWN AEROBIC AND ANAEROBIC Blood Culture results may not be optimal due to an inadequate volume of blood received in culture bottles   Culture  Setup Time   Final    GRAM POSITIVE COCCI IN BOTH AEROBIC AND ANAEROBIC BOTTLES CRITICAL RESULT CALLED TO, READ BACK BY AND VERIFIED WITH: PHARMD GREG A [id number] FCP    Culture (A)  Final    ENTEROCOCCUS FAECIUM VANCOMYCIN  RESISTANT ENTEROCOCCUS SEE SEPARATE REPORT Performed at [facility], [address], [place], [place] [address]    Report Status 12/03/2023 FINAL  Final   Organism ID, Bacteria ENTEROCOCCUS FAECIUM  Final      Susceptibility   Enterococcus faecium - MIC*    AMPICILLIN  >=32 RESISTANT Resistant     VANCOMYCIN  >=32 RESISTANT Resistant     GENTAMICIN SYNERGY SENSITIVE Sensitive     * ENTEROCOCCUS FAECIUM  Blood Culture ID Panel (Reflexed)     Status: Abnormal   Collection Time: 11/27/23  9:33 AM  Result Value Ref Range Status   Enterococcus faecalis NOT DETECTED NOT DETECTED Final   Enterococcus Faecium DETECTED (A) NOT DETECTED Final    Comment: CRITICAL RESULT CALLED TO, READ BACK BY AND VERIFIED WITH: PHARMD GREG A [id number] FCP      Listeria monocytogenes NOT DETECTED NOT DETECTED Final   Staphylococcus species NOT DETECTED NOT DETECTED Final   Staphylococcus aureus (BCID) NOT DETECTED NOT DETECTED Final   Staphylococcus epidermidis NOT DETECTED NOT DETECTED Final   Staphylococcus lugdunensis NOT DETECTED NOT DETECTED Final   Streptococcus species NOT DETECTED NOT DETECTED Final  Streptococcus agalactiae NOT DETECTED NOT DETECTED Final   Streptococcus pneumoniae NOT DETECTED NOT DETECTED Final   Streptococcus pyogenes NOT DETECTED NOT DETECTED Final   A.calcoaceticus-baumannii NOT DETECTED NOT DETECTED Final   Bacteroides fragilis NOT DETECTED NOT DETECTED Final    Enterobacterales NOT DETECTED NOT DETECTED Final   Enterobacter cloacae complex NOT DETECTED NOT DETECTED Final   Escherichia coli NOT DETECTED NOT DETECTED Final   Klebsiella aerogenes NOT DETECTED NOT DETECTED Final   Klebsiella oxytoca NOT DETECTED NOT DETECTED Final   Klebsiella pneumoniae NOT DETECTED NOT DETECTED Final   Proteus species NOT DETECTED NOT DETECTED Final   Salmonella species NOT DETECTED NOT DETECTED Final   Serratia marcescens NOT DETECTED NOT DETECTED Final   Haemophilus influenzae NOT DETECTED NOT DETECTED Final   Neisseria meningitidis NOT DETECTED NOT DETECTED Final   Pseudomonas aeruginosa NOT DETECTED NOT DETECTED Final   Stenotrophomonas maltophilia NOT DETECTED NOT DETECTED Final   Candida albicans NOT DETECTED NOT DETECTED Final   Candida auris NOT DETECTED NOT DETECTED Final   Candida glabrata NOT DETECTED NOT DETECTED Final   Candida krusei NOT DETECTED NOT DETECTED Final   Candida parapsilosis NOT DETECTED NOT DETECTED Final   Candida tropicalis NOT DETECTED NOT DETECTED Final   Cryptococcus neoformans/gattii NOT DETECTED NOT DETECTED Final   Vancomycin  resistance DETECTED (A) NOT DETECTED Final    Comment: CRITICAL RESULT CALLED TO, READ BACK BY AND VERIFIED WITH: Alycia Babcock A [id number] FCP   Performed at [facility], [address], [place], [place] [address]   MIC (1 Drug)-blood cultures; 11/27/2023; Left Antecubital; enterococcus faecium; Daptomycin ; Patient immune status: Normal     Status: None   Collection Time: 11/27/23  9:33 AM   Specimen: Left Antecubital  Result Value Ref Range Status   Min Inhibitory Conc (1 Drug) Final report  Corrected    Comment: (NOTE) Performed At: [facility] [address] [place], [place] [id number] Pearlean Botts MD BM:[id number] CORRECTED ON 04/29 AT 1335: PREVIOUSLY REPORTED AS Preliminary report    Source BLOOD  Final    Comment: Performed at [facility], [address]  [place], [place] [address]  MIC Result     Status: None   Collection Time: 11/27/23  9:33 AM  Result Value Ref Range Status   Result 1 (MIC) Enterococcus faecium  Final    Comment: (NOTE) Identification performed by account, not confirmed by this laboratory. Testing performed by broth microdilution. DAPTOMYCIN   2 ug/mL SUSCEPTIBLE Performed At: [facility] [address] [place], [place] [id number] Pearlean Botts MD QV:[id number]   Blood Culture (routine x 2)     Status: Abnormal   Collection Time: 11/27/23 10:23 AM   Specimen: BLOOD  Result Value Ref Range Status   Specimen Description BLOOD RIGHT ANTECUBITAL  Final   Special Requests   Final    BOTTLES DRAWN AEROBIC AND ANAEROBIC Blood Culture results may not be optimal due to an inadequate volume of blood received in culture bottles   Culture  Setup Time   Final    GRAM POSITIVE COCCI IN BOTH AEROBIC AND ANAEROBIC BOTTLES CRITICAL VALUE NOTED.  VALUE IS CONSISTENT WITH PREVIOUSLY REPORTED AND CALLED VALUE.    Culture (A)  Final    ENTEROCOCCUS FAECIUM SUSCEPTIBILITIES PERFORMED ON PREVIOUS CULTURE WITHIN THE LAST 5 DAYS. Performed at [facility], [address] [place], [place] [address]    Report Status 11/30/2023 FINAL  Final  MRSA Next Gen by  PCR, Nasal     Status: None   Collection Time: 11/28/23  5:52 AM   Specimen: Nasal Mucosa; Nasal Swab  Result Value Ref Range Status   MRSA by PCR Next Gen NOT DETECTED NOT DETECTED Final    Comment: (NOTE) The GeneXpert MRSA Assay (FDA approved for NASAL specimens only), is one component of a comprehensive MRSA colonization surveillance program. It is not intended to diagnose MRSA infection nor to guide or monitor treatment for MRSA infections. Test performance is not FDA approved in patients less than 59 years old. Performed at [facility], [address], [place], [place] [address]   Culture, blood (Routine X 2) w Reflex to ID Panel     Status: None    Collection Time: 11/29/23  5:16 AM   Specimen: BLOOD LEFT ARM  Result Value Ref Range Status   Specimen Description BLOOD LEFT ARM  Final   Special Requests   Final    BOTTLES DRAWN AEROBIC AND ANAEROBIC Blood Culture adequate volume   Culture   Final    NO GROWTH 5 DAYS Performed at [facility], [address] [place], [place] [address]    Report Status 12/04/2023 FINAL  Final  Culture, blood (Routine X 2) w Reflex to ID Panel     Status: None   Collection Time: 11/29/23  5:16 AM   Specimen: BLOOD RIGHT HAND  Result Value Ref Range Status   Specimen Description BLOOD RIGHT HAND  Final   Special Requests   Final    BOTTLES DRAWN AEROBIC AND ANAEROBIC Blood Culture adequate volume   Culture   Final    NO GROWTH 5 DAYS Performed at [facility], [address], [place], [place] [address]    Report Status 12/04/2023 FINAL  Final  SARS Coronavirus 2 by RT PCR (hospital order, performed in [facility] hospital lab) *cepheid single result test* Anterior Nasal Swab     Status: Abnormal   Collection Time: 12/03/23  8:19 AM   Specimen: Anterior Nasal Swab  Result Value Ref Range Status   SARS Coronavirus 2 by RT PCR POSITIVE (A) NEGATIVE Final    Comment: Performed at [facility], [address] [place], [place] [address]    Radiology Report No results found.    Signature  -   Seena Dadds M.D on 12/06/2023 at 12:59 PM   -  To page go to [email or web address]

## 2023-12-06 NOTE — Progress Notes (Signed)
   [facility] has been requested to perform a transesophageal echocardiogram on [name] for bacteremia.     The patient does NOT have any absolute or relative contraindications to a Transesophageal Echocardiogram (TEE).  The patient has: No other conditions that may impact this procedure.    After careful review of history and examination, the risks and benefits of transesophageal echocardiogram have been explained including risks of esophageal damage, perforation (1:10,000 risk), bleeding, pharyngeal hematoma as well as other potential complications associated with conscious sedation including aspiration, arrhythmia, respiratory failure and death. Alternatives to treatment were discussed, questions were answered. Patient is willing to proceed.   Signed, Warren Haber Cristobal Advani, [place]  12/06/2023 2:44 PM

## 2023-12-06 NOTE — H&P (View-Only) (Signed)
 PROGRESS NOTE                                                                                                                                                                                                             Patient Demographics:    Lawrence [name], is a 68 y.o. male, DOB - 06-13-1956, AVW:[id number]  Outpatient Primary MD for the patient is Darnelle Elders, PA-C    LOS - 8  Admit date - 11/27/2023    Chief Complaint  Patient presents with   Level 2 FOT   Code Sepsis       Brief Narrative (HPI from H&P)    68 y.o. male with medical history significant of COPD, severe aortic stenosis status post TAVR in July 2024, seizure disorder, chronic diastolic CHF (follows Dr. Mitzie Anda), atrial fibrillation on Eliquis , CVA, gout, OSA on nighttime CPAP, anxiety, BPH, chronic venous stasis, and morbid obesity presents after having an unwitnessed fall out of his wheelchair.   Records note patient had just recently been hospitalized 3/14-3/19 for sepsis with acute respiratory failure with hypoxia secondary to influenza A with pneumonia and COPD exacerbation.   The patient, who normally uses a wheelchair for mobility, experienced a fall after leaning forward attempting to pick something off the floor with his pick-up stick and hit his head.  Denied any loss of consciousness.  He was found to be febrile was COVID-19 positive, his only complaint was right foot pain, subsequently his blood cultures came back positive for gram-positive cocci 2 out of 2 when he was admitted.   Subjective:   Patient in bed, appears comfortable, denies any headache, no fever, no chest pain or pressure, no shortness of breath , no abdominal pain. No new focal weakness.  Patient had hypoglycemia overnight, off sliding scale and repaglinide .   Assessment  & Plan :   Sepsis, Incidental COVID-19 infection.  2 out of 2 blood culture positive for Enterococcus.   Source unclear, CT chest abdomen pelvis nonacute, his only complaint is ongoing pain in his right foot which could be due to underlying gout, x-ray right foot, currently on IV Daptomycin  >> Ampicillin  12/01/23, ID following, appears nontoxic.  Continue supportive care with IV fluids and midodrine  for blood pressure, TTE stable, TEE after he finishes his COVID isolation.  Has underlying history of MR.  Monitor.  COVID test  repeated on 12/03/2023, +ve CT value - is still 25.8, previous was 19, so definitely improving and trending in the right direction. -Plan for TEE tomorrow.   Status post TAVR No acute issues noted per last echocardiogram from 06/2023, kindly see echo above.  HX of gout, ongoing right foot pain for the last few weeks, twitching in the right leg.  Has history of gout and seizures, stable EEG, stable uric acid level stable, hold allopurinol , continue colchicine , even a trial of IV steroid on 11/28/2023, repeat on 11/29/2023, right foot pain and twitching considerably improved.  X-ray of the right foot nonacute.  Incidental COVID 19 infection.  Acute but appears to be incidental.  No cough or shortness of breath, inflammatory markers stable, not a candidate for Paxlovid due to Eliquis .  Continue to monitor.   Hypotension Acute.  Initial blood pressures noted to be as low as 77/38.  Suspect secondary to acute infection.  Blood pressures improved with IV fluids. Blood pressure has stabilized on midodrine , challenge with low-dose diuretics and gentle down titration of midodrine .   Fall out of wheelchair - CT scan of the head and cervical spine did not note any acute abnormality and chronic changes as previously noted on previous MRI imaging from 3/3.  PT/OT to evaluate and treat.   COPD, without acute exacerbation at this time  Patient does not appear to have active wheezing on physical exam although decreased overall aeration. Albuterol  nebs as needed for shortness of breath/wheezing.    Paroxysmal atrial fibrillation, [place] vasc 2 score of greater than 3 on chronic anticoagulation  Patient appears to be in sinus rhythm at this time. Continue Eliquis .  Not on rate controlling agents.   Chr. Diastolic congestive heart failure - has mild fluid overload which is chronic, on oral diuretic less than home dose and doing well.  Seizure disorder  - Continue Keppra  and lacosamide , has been having twitching in his right lower extremity for a few days, unremarkable EEG.   Hyperlipidemia  - Continue fenofibrate  and atorvastatin     Anxiety and depression - Continue trazodone , mirtazapine , buspirone , venlafaxine , and aripiprazole    Morbid obesity - OSA on CPAP  BMI 46.22 kg/m, follow-up with PCP weight loss, continue nighttime CPAP. -Mounjaro  on hold   Diabetes mellitus type 2, with long-term use of insulin  -  -Hypoglycemia overnight, Prandin  and sliding scale has been discontinued Mounjaro  on hold  Lab Results  Component Value Date   HGBA1C 6.6 (H) 10/04/2023   CBG (last 3)  Recent Labs    12/06/23 0407 12/06/23 0810 12/06/23 1224  GLUCAP 90 129* 135*          Condition - Extremely Guarded  Family Communication  : None at bedside  Code Status :  DNR  Consults  :  ID, PCCM  PUD Prophylaxis :     Procedures  :     TEE   TTE 1. Left ventricular ejection fraction, by estimation, is 60 to 65%. The left ventricle has normal function. The left ventricle has no regional wall motion abnormalities. Left ventricular diastolic function could not be evaluated.  2. Right ventricular systolic function is normal. The right ventricular size is normal. Tricuspid regurgitation signal is inadequate for assessing PA pressure.  3. Moderate pericardial effusion. The pericardial effusion is circumferential.  4. The mitral valve is normal in structure. No evidence of mitral valve regurgitation. No evidence of mitral stenosis.  5. The aortic valve is normal in structure. Aortic valve  regurgitation is not visualized. No aortic stenosis is present. There is a 34 Medtronic CoreValve-Evolut Pro prosthetic (TAVR) valve present in the aortic position. Aortic valve area, by VTI measures 1.58 cm. Aortic valve mean gradient measures 19.0 mmHg. Aortic valve Vmax measures 2.63 m/s.  6. The inferior vena cava is dilated in size with <50% respiratory variability, suggesting right atrial pressure of 15 mmHg. Conclusion(s)/Recommendation(s): Images are not adequate to rule out vegetation. Recommend TEE for further evaluation.  EEG - non acute  CT head.  C-spine.    CT head: 1.  No evidence of an acute intracranial abnormality. 2. Forehead hematoma on the right. 3. Cerebral atrophy. 4. Paranasal sinus disease as described.   CT cervical spine: 1. No evidence of an acute cervical spine fracture. 2. Grade 1 anterolisthesis at C2-C3, C3-C4 and C4-C5. 3. Levocurvature of the cervical spine. 4. Cervical spondylosis as described within the body of the report. Multifactorial severe spinal canal stenosis at C3-C4 (as was demonstrated on the recent prior cervical spine MRI of 10/09/2023). Multilevel bony neural foraminal narrowing. 5. Erosions noted along the left C3-C4 facet joint, a finding which can be seen in setting of CPPD arthropathy. 6. 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  CT Chest, abdomen and pelvis-  1. No acute findings. 2. Aortic Atherosclerosis (ICD10-I70.0) and Emphysema (ICD10-J43.9). Coronary and systemic atherosclerosis. 3. Sigmoid colon diverticulosis. 4. Gluteal and upper thigh muscular atrophy with the exception of the hip adductor musculature. There is some paraspinal muscular atrophy and atrophy of the latissimus dorsi muscles. 5. Dorsal stimulator device noted electrodes at the T12-L1 level.      Disposition Plan  :    Status is: Inpatient   DVT Prophylaxis  :    Place TED hose Start: 11/28/23 1000 apixaban  (ELIQUIS ) tablet 5 mg     Lab Results  Component Value Date    PLT 231 12/06/2023    Diet :  Diet Order             Diet heart healthy/carb modified Room service appropriate? Yes; Fluid consistency: Thin  Diet effective now                    Inpatient Medications  Scheduled Meds:  apixaban   5 mg Oral BID   ARIPiprazole   5 mg Oral QHS   busPIRone   10 mg Oral TID   colchicine   0.6 mg Oral BID   cyanocobalamin   1,000 mcg Oral Daily   fenofibrate   160 mg Oral Daily   fluticasone   2 spray Each Nare Daily   guaiFENesin   600 mg Oral BID   lacosamide   150 mg Oral BID   levETIRAcetam   500 mg Oral BID   mirtazapine   15 mg Oral QHS   prazosin   2 mg Oral QHS   pregabalin   150 mg Oral TID   rOPINIRole   0.25 mg Oral BID   sodium chloride  flush  3 mL Intravenous Q12H   spironolactone   50 mg Oral Daily   torsemide   20 mg Oral Daily   traZODone   200 mg Oral QHS   umeclidinium bromide   1 puff Inhalation Daily   venlafaxine  XR  150 mg Oral Q breakfast   Continuous Infusions:  ampicillin  (OMNIPEN) IV 2 g (12/06/23 1050)   DAPTOmycin  1,100 mg (12/05/23 1412)   PRN Meds:.acetaminophen  **OR** acetaminophen , albuterol , bisacodyl , HYDROcodone -acetaminophen , Muscle Rub, ondansetron  **OR** ondansetron  (ZOFRAN ) IV     Objective:   Vitals:   12/06/23 0500 12/06/23 0810 12/06/23  0847 12/06/23 1220  BP:  115/76  107/68  Pulse:  69  65  Resp:  12  14  Temp:  97.9 F (36.6 C)  98.4 F (36.9 C)  TempSrc:  Oral  Oral  SpO2:  92% 96% 93%  Weight: (!) 160 kg     Height:        Wt Readings from Last 3 Encounters:  12/06/23 (!) 160 kg  10/25/23 (!) 150 kg  10/10/23 (!) 157 kg     Intake/Output Summary (Last 24 hours) at 12/06/2023 1259 Last data filed at 12/06/2023 0933 Gross per 24 hour  Intake --  Output 3300 ml  Net -3300 ml     Physical Exam    Awake Alert, Oriented X 3, frail, deconditioned Symmetrical Chest wall movement, Good air movement bilaterally, CTAB RRR,No Gallops,Rubs or new Murmurs, No Parasternal Heave +ve  B.Sounds, Abd Soft, No tenderness, No rebound - guarding or rigidity. Trace edema, both feet on floating boots     Data Review:    Recent Labs  Lab 12/01/23 0442 12/02/23 0516 12/03/23 0559 12/05/23 0430 12/06/23 0435  WBC 4.9 5.8 6.6 6.7 6.9  HGB 12.6* 13.3 12.6* 13.3 13.4  HCT 38.1* 39.9 38.8* 39.5 40.8  PLT 195 184 224 231 231  MCV 91.1 90.9 91.7 89.4 91.7  MCH 30.1 30.3 29.8 30.1 30.1  MCHC 33.1 33.3 32.5 33.7 32.8  RDW 13.3 13.3 13.6 13.4 13.7  LYMPHSABS 1.5 1.6 2.0 1.9 2.5  MONOABS 0.5 0.6 0.7 0.5 0.6  EOSABS 0.2 0.4 0.5 0.5 0.5  BASOSABS 0.0 0.0 0.0 0.1 0.1    Recent Labs  Lab 11/30/23 0511 12/01/23 0442 12/02/23 0516 12/03/23 0559 12/05/23 0430 12/06/23 0435  NA 141 141 141 140 141 139  K 3.7 3.3* 3.6 3.5 3.7 3.7  CL 106 104 104 105 106 105  CO2 26 26 28 25 27 24   ANIONGAP 9 11 9 10 8 10   GLUCOSE 100* 76 85 95 94 93  BUN 18 17 16 13 14 17   CREATININE 1.03 1.02 1.00 0.87 1.01 0.96  CRP 1.1* 0.6 0.5  --   --   --   PROCALCITON <0.10 <0.10 <0.10  --   --   --   BNP 144.4* 96.6 64.1  --   --   --   MG 2.0 2.0 2.0  --  2.0 2.1  CALCIUM  8.9 8.8* 8.7* 8.6* 8.9 8.5*      Recent Labs  Lab 11/30/23 0511 12/01/23 0442 12/02/23 0516 12/03/23 0559 12/05/23 0430 12/06/23 0435  CRP 1.1* 0.6 0.5  --   --   --   PROCALCITON <0.10 <0.10 <0.10  --   --   --   BNP 144.4* 96.6 64.1  --   --   --   MG 2.0 2.0 2.0  --  2.0 2.1  CALCIUM  8.9 8.8* 8.7* 8.6* 8.9 8.5*    --------------------------------------------------------------------------------------------------------------- Lab Results  Component Value Date   CHOL 151 06/25/2023   HDL 30 (L) 06/25/2023   LDLCALC 79 06/25/2023   TRIG 209 (H) 06/25/2023   CHOLHDL 5.0 06/25/2023    Lab Results  Component Value Date   HGBA1C 6.6 (H) 10/04/2023      Micro Results Recent Results (from the past 240 hours)  Resp panel by RT-PCR (RSV, Flu A&B, Covid) Anterior Nasal Swab     Status: Abnormal   Collection  Time: 11/27/23  9:33 AM   Specimen: Anterior Nasal Swab  Result Value Ref Range Status   SARS Coronavirus 2 by RT PCR POSITIVE (A) NEGATIVE Final   Influenza A by PCR NEGATIVE NEGATIVE Final   Influenza B by PCR NEGATIVE NEGATIVE Final    Comment: (NOTE) The Xpert Xpress SARS-CoV-2/FLU/RSV plus assay is intended as an aid in the diagnosis of influenza from Nasopharyngeal swab specimens and should not be used as a sole basis for treatment. Nasal washings and aspirates are unacceptable for Xpert Xpress SARS-CoV-2/FLU/RSV testing.  Fact Sheet for Patients: BloggerCourse.com  Fact Sheet for Healthcare Providers: SeriousBroker.it  This test is not yet approved or cleared by the [place]  FDA and has been authorized for detection and/or diagnosis of SARS-CoV-2 by FDA under an Emergency Use Authorization (EUA). This EUA will remain in effect (meaning this test can be used) for the duration of the COVID-19 declaration under Section 564(b)(1) of the Act, 21 U.S.C. section 360bbb-3(b)(1), unless the authorization is terminated or revoked.     Resp Syncytial Virus by PCR NEGATIVE NEGATIVE Final    Comment: (NOTE) Fact Sheet for Patients: BloggerCourse.com  Fact Sheet for Healthcare Providers: SeriousBroker.it  This test is not yet approved or cleared by the [place]  FDA and has been authorized for detection and/or diagnosis of SARS-CoV-2 by FDA under an Emergency Use Authorization (EUA). This EUA will remain in effect (meaning this test can be used) for the duration of the COVID-19 declaration under Section 564(b)(1) of the Act, 21 U.S.C. section 360bbb-3(b)(1), unless the authorization is terminated or revoked.  Performed at [facility], [address] [place], [place] [address]   Blood Culture (routine x 2)     Status: Abnormal   Collection Time: 11/27/23  9:33 AM    Specimen: BLOOD  Result Value Ref Range Status   Specimen Description BLOOD LEFT ANTECUBITAL  Final   Special Requests   Final    BOTTLES DRAWN AEROBIC AND ANAEROBIC Blood Culture results may not be optimal due to an inadequate volume of blood received in culture bottles   Culture  Setup Time   Final    GRAM POSITIVE COCCI IN BOTH AEROBIC AND ANAEROBIC BOTTLES CRITICAL RESULT CALLED TO, READ BACK BY AND VERIFIED WITH: PHARMD GREG A [id number] FCP    Culture (A)  Final    ENTEROCOCCUS FAECIUM VANCOMYCIN  RESISTANT ENTEROCOCCUS SEE SEPARATE REPORT Performed at [facility], [address], [place], [place] [address]    Report Status 12/03/2023 FINAL  Final   Organism ID, Bacteria ENTEROCOCCUS FAECIUM  Final      Susceptibility   Enterococcus faecium - MIC*    AMPICILLIN  >=32 RESISTANT Resistant     VANCOMYCIN  >=32 RESISTANT Resistant     GENTAMICIN SYNERGY SENSITIVE Sensitive     * ENTEROCOCCUS FAECIUM  Blood Culture ID Panel (Reflexed)     Status: Abnormal   Collection Time: 11/27/23  9:33 AM  Result Value Ref Range Status   Enterococcus faecalis NOT DETECTED NOT DETECTED Final   Enterococcus Faecium DETECTED (A) NOT DETECTED Final    Comment: CRITICAL RESULT CALLED TO, READ BACK BY AND VERIFIED WITH: PHARMD GREG A [id number] FCP      Listeria monocytogenes NOT DETECTED NOT DETECTED Final   Staphylococcus species NOT DETECTED NOT DETECTED Final   Staphylococcus aureus (BCID) NOT DETECTED NOT DETECTED Final   Staphylococcus epidermidis NOT DETECTED NOT DETECTED Final   Staphylococcus lugdunensis NOT DETECTED NOT DETECTED Final   Streptococcus species NOT DETECTED NOT DETECTED Final  Streptococcus agalactiae NOT DETECTED NOT DETECTED Final   Streptococcus pneumoniae NOT DETECTED NOT DETECTED Final   Streptococcus pyogenes NOT DETECTED NOT DETECTED Final   A.calcoaceticus-baumannii NOT DETECTED NOT DETECTED Final   Bacteroides fragilis NOT DETECTED NOT DETECTED Final    Enterobacterales NOT DETECTED NOT DETECTED Final   Enterobacter cloacae complex NOT DETECTED NOT DETECTED Final   Escherichia coli NOT DETECTED NOT DETECTED Final   Klebsiella aerogenes NOT DETECTED NOT DETECTED Final   Klebsiella oxytoca NOT DETECTED NOT DETECTED Final   Klebsiella pneumoniae NOT DETECTED NOT DETECTED Final   Proteus species NOT DETECTED NOT DETECTED Final   Salmonella species NOT DETECTED NOT DETECTED Final   Serratia marcescens NOT DETECTED NOT DETECTED Final   Haemophilus influenzae NOT DETECTED NOT DETECTED Final   Neisseria meningitidis NOT DETECTED NOT DETECTED Final   Pseudomonas aeruginosa NOT DETECTED NOT DETECTED Final   Stenotrophomonas maltophilia NOT DETECTED NOT DETECTED Final   Candida albicans NOT DETECTED NOT DETECTED Final   Candida auris NOT DETECTED NOT DETECTED Final   Candida glabrata NOT DETECTED NOT DETECTED Final   Candida krusei NOT DETECTED NOT DETECTED Final   Candida parapsilosis NOT DETECTED NOT DETECTED Final   Candida tropicalis NOT DETECTED NOT DETECTED Final   Cryptococcus neoformans/gattii NOT DETECTED NOT DETECTED Final   Vancomycin  resistance DETECTED (A) NOT DETECTED Final    Comment: CRITICAL RESULT CALLED TO, READ BACK BY AND VERIFIED WITH: Alycia Babcock A [id number] FCP   Performed at [facility], [address], [place], [place] [address]   MIC (1 Drug)-blood cultures; 11/27/2023; Left Antecubital; enterococcus faecium; Daptomycin ; Patient immune status: Normal     Status: None   Collection Time: 11/27/23  9:33 AM   Specimen: Left Antecubital  Result Value Ref Range Status   Min Inhibitory Conc (1 Drug) Final report  Corrected    Comment: (NOTE) Performed At: [facility] [address] [place], [place] [id number] Pearlean Botts MD BM:[id number] CORRECTED ON 04/29 AT 1335: PREVIOUSLY REPORTED AS Preliminary report    Source BLOOD  Final    Comment: Performed at [facility], [address]  [place], [place] [address]  MIC Result     Status: None   Collection Time: 11/27/23  9:33 AM  Result Value Ref Range Status   Result 1 (MIC) Enterococcus faecium  Final    Comment: (NOTE) Identification performed by account, not confirmed by this laboratory. Testing performed by broth microdilution. DAPTOMYCIN   2 ug/mL SUSCEPTIBLE Performed At: [facility] [address] [place], [place] [id number] Pearlean Botts MD QV:[id number]   Blood Culture (routine x 2)     Status: Abnormal   Collection Time: 11/27/23 10:23 AM   Specimen: BLOOD  Result Value Ref Range Status   Specimen Description BLOOD RIGHT ANTECUBITAL  Final   Special Requests   Final    BOTTLES DRAWN AEROBIC AND ANAEROBIC Blood Culture results may not be optimal due to an inadequate volume of blood received in culture bottles   Culture  Setup Time   Final    GRAM POSITIVE COCCI IN BOTH AEROBIC AND ANAEROBIC BOTTLES CRITICAL VALUE NOTED.  VALUE IS CONSISTENT WITH PREVIOUSLY REPORTED AND CALLED VALUE.    Culture (A)  Final    ENTEROCOCCUS FAECIUM SUSCEPTIBILITIES PERFORMED ON PREVIOUS CULTURE WITHIN THE LAST 5 DAYS. Performed at [facility], [address] [place], [place] [address]    Report Status 11/30/2023 FINAL  Final  MRSA Next Gen by  PCR, Nasal     Status: None   Collection Time: 11/28/23  5:52 AM   Specimen: Nasal Mucosa; Nasal Swab  Result Value Ref Range Status   MRSA by PCR Next Gen NOT DETECTED NOT DETECTED Final    Comment: (NOTE) The GeneXpert MRSA Assay (FDA approved for NASAL specimens only), is one component of a comprehensive MRSA colonization surveillance program. It is not intended to diagnose MRSA infection nor to guide or monitor treatment for MRSA infections. Test performance is not FDA approved in patients less than 59 years old. Performed at [facility], [address], [place], [place] [address]   Culture, blood (Routine X 2) w Reflex to ID Panel     Status: None    Collection Time: 11/29/23  5:16 AM   Specimen: BLOOD LEFT ARM  Result Value Ref Range Status   Specimen Description BLOOD LEFT ARM  Final   Special Requests   Final    BOTTLES DRAWN AEROBIC AND ANAEROBIC Blood Culture adequate volume   Culture   Final    NO GROWTH 5 DAYS Performed at [facility], [address] [place], [place] [address]    Report Status 12/04/2023 FINAL  Final  Culture, blood (Routine X 2) w Reflex to ID Panel     Status: None   Collection Time: 11/29/23  5:16 AM   Specimen: BLOOD RIGHT HAND  Result Value Ref Range Status   Specimen Description BLOOD RIGHT HAND  Final   Special Requests   Final    BOTTLES DRAWN AEROBIC AND ANAEROBIC Blood Culture adequate volume   Culture   Final    NO GROWTH 5 DAYS Performed at [facility], [address], [place], [place] [address]    Report Status 12/04/2023 FINAL  Final  SARS Coronavirus 2 by RT PCR (hospital order, performed in [facility] hospital lab) *cepheid single result test* Anterior Nasal Swab     Status: Abnormal   Collection Time: 12/03/23  8:19 AM   Specimen: Anterior Nasal Swab  Result Value Ref Range Status   SARS Coronavirus 2 by RT PCR POSITIVE (A) NEGATIVE Final    Comment: Performed at [facility], [address] [place], [place] [address]    Radiology Report No results found.    Signature  -   Seena Dadds M.D on 12/06/2023 at 12:59 PM   -  To page go to [email or web address]

## 2023-12-06 NOTE — Plan of Care (Signed)
 Progressing

## 2023-12-06 NOTE — TOC Progression Note (Signed)
 Transition of Care [facility]) - Progression Note    Patient Details  Name: Lawrence [name] MRN: [id number] Date of Birth: February 26, 1956  [facility] CM/SW Contact  Jannice Mends, LCSW Phone Number: 12/06/2023, 8:53 AM  Clinical Narrative:    CSW continuing to follow. [name] updated.    Expected Discharge Plan: Skilled Nursing Facility Barriers to Discharge: Continued Medical Work up  Expected Discharge Plan and Services In-house Referral: Clinical Social Work   Post Acute Care Choice: Skilled Nursing Facility Living arrangements for the past 2 months: Skilled Nursing Facility                                       Social Determinants of Health (SDOH) Interventions SDOH Screenings   Food Insecurity: No Food Insecurity (11/29/2023)  Housing: Low Risk  (11/29/2023)  Transportation Needs: No Transportation Needs (11/29/2023)  Recent Concern: Transportation Needs - Unmet Transportation Needs (10/03/2023)  Utilities: Not At Risk (11/29/2023)  Alcohol  Screen: Low Risk  (06/08/2021)  Financial Resource Strain: Medium Risk (12/06/2021)   Received from [facility], [facility]  Physical Activity: Unknown (12/06/2021)   Received from [facility], [facility]  Social Connections: Socially Isolated (11/29/2023)  Stress: No Stress Concern Present (01/21/2022)   Received from [facility], [facility]  Recent Concern: Stress - Stress Concern Present (11/19/2021)   Received from [facility]  Tobacco Use: Medium Risk (10/20/2023)    Readmission Risk Interventions    11/28/2023    2:45 PM 07/24/2023   11:56 AM 05/08/2023    4:37 PM  Readmission Risk Prevention Plan  Transportation Screening Complete Complete Complete  Medication Review Oceanographer) Complete Complete Complete  PCP or Specialist appointment within 3-5 days of discharge Complete Complete Complete  HRI or Home Care Consult Complete Complete Complete  SW Recovery Care/Counseling Consult  Complete Complete Complete  Palliative Care Screening Not Applicable Not Applicable Complete  Skilled Nursing Facility Complete Complete Not Applicable

## 2023-12-07 ENCOUNTER — Inpatient Hospital Stay (HOSPITAL_COMMUNITY): Admitting: Certified Registered"

## 2023-12-07 ENCOUNTER — Encounter (HOSPITAL_COMMUNITY): Payer: Self-pay | Admitting: Internal Medicine

## 2023-12-07 ENCOUNTER — Encounter (HOSPITAL_COMMUNITY)
Admission: EM | Disposition: A | Discharge status: Skilled Nursing Facility | Payer: Self-pay | Source: Skilled Nursing Facility | Attending: Internal Medicine

## 2023-12-07 ENCOUNTER — Inpatient Hospital Stay (HOSPITAL_COMMUNITY)

## 2023-12-07 ENCOUNTER — Telehealth (HOSPITAL_COMMUNITY): Payer: Self-pay | Admitting: Pharmacy Technician

## 2023-12-07 ENCOUNTER — Other Ambulatory Visit (HOSPITAL_COMMUNITY): Payer: Self-pay

## 2023-12-07 DIAGNOSIS — R7881 Bacteremia: Secondary | ICD-10-CM

## 2023-12-07 DIAGNOSIS — I509 Heart failure, unspecified: Secondary | ICD-10-CM

## 2023-12-07 DIAGNOSIS — I3139 Other pericardial effusion (noninflammatory): Secondary | ICD-10-CM

## 2023-12-07 DIAGNOSIS — I110 Hypertensive heart disease with heart failure: Secondary | ICD-10-CM

## 2023-12-07 DIAGNOSIS — R509 Fever, unspecified: Secondary | ICD-10-CM

## 2023-12-07 DIAGNOSIS — U071 COVID-19: Secondary | ICD-10-CM | POA: Diagnosis not present

## 2023-12-07 DIAGNOSIS — I2510 Atherosclerotic heart disease of native coronary artery without angina pectoris: Secondary | ICD-10-CM

## 2023-12-07 HISTORY — PX: TRANSESOPHAGEAL ECHOCARDIOGRAM (CATH LAB): EP1270

## 2023-12-07 LAB — CBC WITH DIFFERENTIAL/PLATELET
Abs Immature Granulocytes: 0.14 10*3/uL — ABNORMAL HIGH (ref 0.00–0.07)
Basophils Absolute: 0.1 10*3/uL (ref 0.0–0.1)
Basophils Relative: 1 %
Eosinophils Absolute: 0.5 10*3/uL (ref 0.0–0.5)
Eosinophils Relative: 7 %
HCT: 42.3 % (ref 39.0–52.0)
Hemoglobin: 13.9 g/dL (ref 13.0–17.0)
Immature Granulocytes: 2 %
Lymphocytes Relative: 32 %
Lymphs Abs: 2.2 10*3/uL (ref 0.7–4.0)
MCH: 29.7 pg (ref 26.0–34.0)
MCHC: 32.9 g/dL (ref 30.0–36.0)
MCV: 90.4 fL (ref 80.0–100.0)
Monocytes Absolute: 0.7 10*3/uL (ref 0.1–1.0)
Monocytes Relative: 9 %
Neutro Abs: 3.5 10*3/uL (ref 1.7–7.7)
Neutrophils Relative %: 49 %
Platelets: 246 10*3/uL (ref 150–400)
RBC: 4.68 MIL/uL (ref 4.22–5.81)
RDW: 13.7 % (ref 11.5–15.5)
WBC: 7.1 10*3/uL (ref 4.0–10.5)
nRBC: 0 % (ref 0.0–0.2)

## 2023-12-07 LAB — GLUCOSE, CAPILLARY
Glucose-Capillary: 124 mg/dL — ABNORMAL HIGH (ref 70–99)
Glucose-Capillary: 103 mg/dL — ABNORMAL HIGH (ref 70–99)
Glucose-Capillary: 98 mg/dL (ref 70–99)
Glucose-Capillary: 223 mg/dL — ABNORMAL HIGH (ref 70–99)
Glucose-Capillary: 220 mg/dL — ABNORMAL HIGH (ref 70–99)

## 2023-12-07 LAB — ECHO TEE
AV Mean grad: 7 mmHg
AV Peak grad: 11.2 mmHg
Ao pk vel: 1.67 m/s

## 2023-12-07 LAB — BASIC METABOLIC PANEL WITH GFR
Anion gap: 10 (ref 5–15)
BUN: 19 mg/dL (ref 8–23)
CO2: 24 mmol/L (ref 22–32)
Calcium: 8.9 mg/dL (ref 8.9–10.3)
Chloride: 105 mmol/L (ref 98–111)
Creatinine, Ser: 0.94 mg/dL (ref 0.61–1.24)
GFR, Estimated: >60 mL/min (ref >60)
Glucose, Bld: 121 mg/dL — ABNORMAL HIGH (ref 70–99)
Potassium: 3.7 mmol/L (ref 3.5–5.1)
Sodium: 139 mmol/L (ref 135–145)

## 2023-12-07 LAB — MAGNESIUM: Magnesium: 2.1 mg/dL (ref 1.7–2.4)

## 2023-12-07 SURGERY — TRANSESOPHAGEAL ECHOCARDIOGRAM (TEE) (CATHLAB)
Anesthesia: Monitor Anesthesia Care

## 2023-12-07 MED ORDER — PREGABALIN 100 MG PO CAPS
100.0000 mg | ORAL_CAPSULE | Freq: Three times a day (TID) | ORAL | Status: DC
  Administered 2023-12-07 – 2023-12-08 (×4): 100 mg via ORAL
  Filled 2023-12-07 (×4): qty 1

## 2023-12-07 MED ORDER — LINEZOLID 600 MG PO TABS
600.0000 mg | ORAL_TABLET | Freq: Two times a day (BID) | ORAL | Status: DC
  Administered 2023-12-07 – 2023-12-08 (×2): 600 mg via ORAL
  Filled 2023-12-07 (×2): qty 1

## 2023-12-07 MED ORDER — SODIUM CHLORIDE 0.9% FLUSH
3.0000 mL | Freq: Two times a day (BID) | INTRAVENOUS | Status: DC
  Administered 2023-12-07: 3 mL via INTRAVENOUS

## 2023-12-07 MED ORDER — SODIUM CHLORIDE 0.9 % IV SOLN: INTRAVENOUS | Status: DC | PRN

## 2023-12-07 MED ORDER — SODIUM CHLORIDE 0.9% FLUSH: 3.0000 mL | INTRAVENOUS | Status: DC | PRN

## 2023-12-07 MED ORDER — MIDODRINE HCL 5 MG PO TABS
5.0000 mg | ORAL_TABLET | Freq: Three times a day (TID) | ORAL | Status: DC
  Administered 2023-12-07 – 2023-12-08 (×3): 5 mg via ORAL
  Filled 2023-12-07 (×3): qty 1

## 2023-12-07 MED ORDER — ATORVASTATIN CALCIUM 40 MG PO TABS
40.0000 mg | ORAL_TABLET | Freq: Every day | ORAL | Status: DC
  Administered 2023-12-07: 40 mg via ORAL
  Filled 2023-12-07: qty 1

## 2023-12-07 MED ORDER — PROPOFOL 500 MG/50ML IV EMUL
INTRAVENOUS | Status: DC | PRN
  Administered 2023-12-07: 100 ug/kg/min via INTRAVENOUS

## 2023-12-07 NOTE — Interval H&P Note (Signed)
 History and Physical Interval Note:  12/07/2023 1:06 PM  Lawrence [name]  has presented today for surgery, with the diagnosis of Bacteremia.  The various methods of treatment have been discussed with the patient and family. After consideration of risks, benefits and other options for treatment, the patient has consented to  Procedure(s): TRANSESOPHAGEAL ECHOCARDIOGRAM (N/A) as a surgical intervention.  The patient's history has been reviewed, patient examined, no change in status, stable for surgery.  I have reviewed the patient's chart and labs.  Questions were answered to the patient's satisfaction.     [name] A Elia Keenum

## 2023-12-07 NOTE — CV Procedure (Signed)
    TRANSESOPHAGEAL ECHOCARDIOGRAM   NAME:  Lawrence [name]    MRN: [id number] DOB:  05-09-1956    ADMIT DATE: 11/27/2023  INDICATIONS: Prosthetic Aortic Valve evaluation  PROCEDURE:   Informed consent was obtained prior to the procedure. The risks, benefits and alternatives for the procedure were discussed and the patient comprehended these risks.  Risks include, but are not limited to, cough, sore throat, vomiting, nausea, somnolence, esophageal and stomach trauma or perforation, bleeding, low blood pressure, aspiration, pneumonia, infection, trauma to the teeth and death.    Procedural time out performed. The oropharynx was anesthetized with topical 1% benzocaine.    Anesthesia was administered by Dr. Joyce Nixon and team.  TThe patient's heart rate, blood pressure, and oxygen saturation are monitored continuously during the procedure.   The transesophageal probe was inserted in the esophagus and stomach without difficulty and multiple views were obtained.   COMPLICATIONS:    There were no immediate complications.  KEY FINDINGS:  No vegetations see.  Normal prosthetic aortic valve paramters  Full report to follow. Further management per primary team.   Gloriann Larger, MD [place]  [facility]  1:20 PM

## 2023-12-07 NOTE — Progress Notes (Signed)
 PROGRESS NOTE                                                                                                                                                                                                             Patient Demographics:    Lawrence [name], is a 68 y.o. male, DOB - [birth year], AOZ:[id number]  Outpatient Primary MD for the patient is Oneil Bigness    LOS - 9  Admit date - 11/27/2023    Chief Complaint  Patient presents with   Level 2 FOT   Code Sepsis       Brief Narrative (HPI from H&P)     68 y.o. male with medical history significant of COPD, severe aortic stenosis status post TAVR in July 2024, seizure disorder, chronic diastolic CHF (follows Dr. Mitzie Anda), atrial fibrillation on Eliquis , CVA, gout, OSA on nighttime CPAP, anxiety, BPH, chronic venous stasis, and morbid obesity presents after having an unwitnessed fall out of his wheelchair.   Records note patient had just recently been hospitalized 3/14-3/19 for sepsis with acute respiratory failure with hypoxia secondary to influenza A with pneumonia and COPD exacerbation.   The patient, who normally uses a wheelchair for mobility, experienced a fall after leaning forward attempting to pick something off the floor with his pick-up stick and hit his head.  Denied any loss of consciousness.  He was found to be febrile was COVID-19 positive, his only complaint was right foot pain, subsequently his blood cultures came back positive for gram-positive cocci 2 out of 2 when he was admitted.   Subjective:   No significant events overnight, he denies any complaints today, he has been n.p.o. since midnight for procedure, no further hypoglycemia.      Assessment  & Plan :   Sepsis, Incidental COVID-19 infection.  2 out of 2 blood culture positive for Enterococcus.   -Source unclear, CT chest abdomen pelvis nonacute, his only complaint is ongoing pain in  his right foot which could be due to underlying gout, x-ray right foot, currently on IV Daptomycin  >> Ampicillin  12/01/23, ID following, appears nontoxic. - Plan for TEE today as he finished 10 days isolation post initial positive COVID test/21. - IV antibiotics management per ID   Status post TAVR  -No acute issues noted per last echocardiogram from 06/2023, kindly see echo above.  HX of gout, ongoing right foot pain for the last few weeks, twitching in the right leg.  Has history of gout and seizures, stable EEG, stable uric acid level stable, hold allopurinol , continue colchicine , even a trial of IV steroid on 11/28/2023, repeat on 11/29/2023, right foot pain and twitching considerably improved.  X-ray of the right foot nonacute.  Incidental COVID 19 infection.  - Asymptomatic,. - Off COVID-19 isolation as of 5/1   Hypotension  - Improving on midodrine  .   Fall out of wheelchair - CT scan of the head and cervical spine did not note any acute abnormality and chronic changes as previously noted on previous MRI imaging from 3/3.  PT/OT to evaluate and treat.   COPD, without acute exacerbation at this time  Patient does not appear to have active wheezing on physical exam although decreased overall aeration. Albuterol  nebs as needed for shortness of breath/wheezing.   Paroxysmal atrial fibrillation, [place] vasc 2 score of greater than 3 on chronic anticoagulation  Patient appears to be in sinus rhythm at this time. Continue Eliquis .  Not on rate controlling agents.   Chr. Diastolic congestive heart failure - has mild fluid overload which is chronic, on oral diuretic less than home dose and doing well.  Seizure disorder  - Continue Keppra  and lacosamide , has been having twitching in his right lower extremity for a few days, unremarkable EEG.   Hyperlipidemia  - Continue fenofibrate  and atorvastatin     Anxiety and depression - Continue trazodone , mirtazapine , buspirone , venlafaxine , and  aripiprazole    Morbid obesity - OSA on CPAP  BMI 46.22 kg/m, follow-up with PCP weight loss, continue nighttime CPAP. -Mounjaro  on hold   Diabetes mellitus type 2, with long-term use of insulin  -  -Hypoglycemia overnight, Prandin  and sliding scale has been discontinued Mounjaro  on hold  Lab Results  Component Value Date   HGBA1C 6.6 (H) 10/04/2023   CBG (last 3)  Recent Labs    12/06/23 2114 12/07/23 0619 12/07/23 0809  GLUCAP 213* 124* 103*          Condition - Extremely Guarded  Family Communication  : None at bedside  Code Status :  DNR  Consults  :  ID, PCCM  PUD Prophylaxis :     Procedures  :     TEE   TTE 1. Left ventricular ejection fraction, by estimation, is 60 to 65%. The left ventricle has normal function. The left ventricle has no regional wall motion abnormalities. Left ventricular diastolic function could not be evaluated.  2. Right ventricular systolic function is normal. The right ventricular size is normal. Tricuspid regurgitation signal is inadequate for assessing PA pressure.  3. Moderate pericardial effusion. The pericardial effusion is circumferential.  4. The mitral valve is normal in structure. No evidence of mitral valve regurgitation. No evidence of mitral stenosis.  5. The aortic valve is normal in structure. Aortic valve regurgitation is not visualized. No aortic stenosis is present. There is a 34 Medtronic CoreValve-Evolut Pro prosthetic (TAVR) valve present in the aortic position. Aortic valve area, by VTI measures 1.58 cm. Aortic valve mean gradient measures 19.0 mmHg. Aortic valve Vmax measures 2.63 m/s.  6. The inferior vena cava is dilated in size with <50% respiratory variability, suggesting right atrial pressure of 15 mmHg. Conclusion(s)/Recommendation(s): Images are not adequate to rule out vegetation. Recommend TEE for further evaluation.  EEG - non acute  CT head.  C-spine.    CT head: 1.  No evidence of an acute  intracranial  abnormality. 2. Forehead hematoma on the right. 3. Cerebral atrophy. 4. Paranasal sinus disease as described.   CT cervical spine: 1. No evidence of an acute cervical spine fracture. 2. Grade 1 anterolisthesis at C2-C3, C3-C4 and C4-C5. 3. Levocurvature of the cervical spine. 4. Cervical spondylosis as described within the body of the report. Multifactorial severe spinal canal stenosis at C3-C4 (as was demonstrated on the recent prior cervical spine MRI of 10/09/2023). Multilevel bony neural foraminal narrowing. 5. Erosions noted along the left C3-C4 facet joint, a finding which can be seen in setting of CPPD arthropathy. 6. 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  CT Chest, abdomen and pelvis-  1. No acute findings. 2. Aortic Atherosclerosis (ICD10-I70.0) and Emphysema (ICD10-J43.9). Coronary and systemic atherosclerosis. 3. Sigmoid colon diverticulosis. 4. Gluteal and upper thigh muscular atrophy with the exception of the hip adductor musculature. There is some paraspinal muscular atrophy and atrophy of the latissimus dorsi muscles. 5. Dorsal stimulator device noted electrodes at the T12-L1 level.      Disposition Plan  :    Status is: Inpatient   DVT Prophylaxis  :    Place TED hose Start: 11/28/23 1000 apixaban  (ELIQUIS ) tablet 5 mg     Lab Results  Component Value Date   PLT 246 12/07/2023    Diet :  Diet Order             Diet NPO time specified  Diet effective midnight                    Inpatient Medications  Scheduled Meds:  apixaban   5 mg Oral BID   ARIPiprazole   5 mg Oral QHS   busPIRone   10 mg Oral TID   colchicine   0.6 mg Oral BID   cyanocobalamin   1,000 mcg Oral Daily   fenofibrate   160 mg Oral Daily   fluticasone   2 spray Each Nare Daily   guaiFENesin   600 mg Oral BID   lacosamide   150 mg Oral BID   levETIRAcetam   500 mg Oral BID   mirtazapine   15 mg Oral QHS   prazosin   2 mg Oral QHS   pregabalin   150 mg Oral TID   rOPINIRole   0.25 mg Oral BID    sodium chloride  flush  3 mL Intravenous Q12H   sodium chloride  flush  3-10 mL Intravenous Q12H   spironolactone   50 mg Oral Daily   torsemide   20 mg Oral Daily   traZODone   200 mg Oral QHS   umeclidinium bromide   1 puff Inhalation Daily   venlafaxine  XR  150 mg Oral Q breakfast   Continuous Infusions:  ampicillin  (OMNIPEN) IV 2 g (12/07/23 0610)   DAPTOmycin  1,100 mg (12/06/23 1434)   PRN Meds:.acetaminophen  **OR** acetaminophen , albuterol , bisacodyl , HYDROcodone -acetaminophen , Muscle Rub, ondansetron  **OR** ondansetron  (ZOFRAN ) IV, sodium chloride  flush     Objective:   Vitals:   12/07/23 0000 12/07/23 0030 12/07/23 0457 12/07/23 0812  BP:  95/72 102/66 113/69  Pulse: 75 (!) 57 65 75  Resp: 18 19 13 10   Temp:    (!) 97.5 F (36.4 C)  TempSrc:    Oral  SpO2: 93%  96% 97%  Weight:   (!) 155.1 kg   Height:        Wt Readings from Last 3 Encounters:  12/07/23 (!) 155.1 kg  10/25/23 (!) 150 kg  10/10/23 (!) 157 kg     Intake/Output Summary (Last 24 hours) at 12/07/2023 1002 Last  data filed at 12/07/2023 0235 Gross per 24 hour  Intake 240 ml  Output 3500 ml  Net -3260 ml     Physical Exam    Awake Alert, Oriented X 3, frail, deconditioned Symmetrical Chest wall movement, Good air movement bilaterally, CTAB RRR,No Gallops,Rubs or new Murmurs, No Parasternal Heave +ve B.Sounds, Abd Soft, No tenderness, No rebound - guarding or rigidity. No Cyanosis, Clubbing or edema, No new Rash or bruise        Data Review:    Recent Labs  Lab 12/02/23 0516 12/03/23 0559 12/05/23 0430 12/06/23 0435 12/07/23 0443  WBC 5.8 6.6 6.7 6.9 7.1  HGB 13.3 12.6* 13.3 13.4 13.9  HCT 39.9 38.8* 39.5 40.8 42.3  PLT 184 224 231 231 246  MCV 90.9 91.7 89.4 91.7 90.4  MCH 30.3 29.8 30.1 30.1 29.7  MCHC 33.3 32.5 33.7 32.8 32.9  RDW 13.3 13.6 13.4 13.7 13.7  LYMPHSABS 1.6 2.0 1.9 2.5 2.2  MONOABS 0.6 0.7 0.5 0.6 0.7  EOSABS 0.4 0.5 0.5 0.5 0.5  BASOSABS 0.0 0.0 0.1 0.1 0.1     Recent Labs  Lab 12/01/23 0442 12/02/23 0516 12/03/23 0559 12/05/23 0430 12/06/23 0435 12/07/23 0443  NA 141 141 140 141 139 139  K 3.3* 3.6 3.5 3.7 3.7 3.7  CL 104 104 105 106 105 105  CO2 26 28 25 27 24 24   ANIONGAP 11 9 10 8 10 10   GLUCOSE 76 85 95 94 93 121*  BUN 17 16 13 14 17 19   CREATININE 1.02 1.00 0.87 1.01 0.96 0.94  CRP 0.6 0.5  --   --   --   --   PROCALCITON <0.10 <0.10  --   --   --   --   BNP 96.6 64.1  --   --   --   --   MG 2.0 2.0  --  2.0 2.1 2.1  CALCIUM  8.8* 8.7* 8.6* 8.9 8.5* 8.9      Recent Labs  Lab 12/01/23 0442 12/02/23 0516 12/03/23 0559 12/05/23 0430 12/06/23 0435 12/07/23 0443  CRP 0.6 0.5  --   --   --   --   PROCALCITON <0.10 <0.10  --   --   --   --   BNP 96.6 64.1  --   --   --   --   MG 2.0 2.0  --  2.0 2.1 2.1  CALCIUM  8.8* 8.7* 8.6* 8.9 8.5* 8.9    --------------------------------------------------------------------------------------------------------------- Lab Results  Component Value Date   CHOL 151 06/25/2023   HDL 30 (L) 06/25/2023   LDLCALC 79 06/25/2023   TRIG 209 (H) 06/25/2023   CHOLHDL 5.0 06/25/2023    Lab Results  Component Value Date   HGBA1C 6.6 (H) 10/04/2023      Micro Results Recent Results (from the past 240 hours)  Blood Culture (routine x 2)     Status: Abnormal   Collection Time: 11/27/23 10:23 AM   Specimen: BLOOD  Result Value Ref Range Status   Specimen Description BLOOD RIGHT ANTECUBITAL  Final   Special Requests   Final    BOTTLES DRAWN AEROBIC AND ANAEROBIC Blood Culture results may not be optimal due to an inadequate volume of blood received in culture bottles   Culture  Setup Time   Final    GRAM POSITIVE COCCI IN BOTH AEROBIC AND ANAEROBIC BOTTLES CRITICAL VALUE NOTED.  VALUE IS CONSISTENT WITH PREVIOUSLY REPORTED AND CALLED VALUE.    Culture (A)  Final    ENTEROCOCCUS FAECIUM SUSCEPTIBILITIES PERFORMED ON PREVIOUS CULTURE WITHIN THE LAST 5 DAYS. Performed at [facility], [address], [place], [place] [address]    Report Status 11/30/2023 FINAL  Final  MRSA Next Gen by PCR, Nasal     Status: None   Collection Time: 11/28/23  5:52 AM   Specimen: Nasal Mucosa; Nasal Swab  Result Value Ref Range Status   MRSA by PCR Next Gen NOT DETECTED NOT DETECTED Final    Comment: (NOTE) The GeneXpert MRSA Assay (FDA approved for NASAL specimens only), is one component of a comprehensive MRSA colonization surveillance program. It is not intended to diagnose MRSA infection nor to guide or monitor treatment for MRSA infections. Test performance is not FDA approved in patients less than 65 years old. Performed at [facility], [address], [place], [place] [address]   Culture, blood (Routine X 2) w Reflex to ID Panel     Status: None   Collection Time: 11/29/23  5:16 AM   Specimen: BLOOD LEFT ARM  Result Value Ref Range Status   Specimen Description BLOOD LEFT ARM  Final   Special Requests   Final    BOTTLES DRAWN AEROBIC AND ANAEROBIC Blood Culture adequate volume   Culture   Final    NO GROWTH 5 DAYS Performed at [facility], [address] [place], [place] [address]    Report Status 12/04/2023 FINAL  Final  Culture, blood (Routine X 2) w Reflex to ID Panel     Status: None   Collection Time: 11/29/23  5:16 AM   Specimen: BLOOD RIGHT HAND  Result Value Ref Range Status   Specimen Description BLOOD RIGHT HAND  Final   Special Requests   Final    BOTTLES DRAWN AEROBIC AND ANAEROBIC Blood Culture adequate volume   Culture   Final    NO GROWTH 5 DAYS Performed at [facility], [address] [place], [place] [address]    Report Status 12/04/2023 FINAL  Final  SARS Coronavirus 2 by RT PCR (hospital order, performed in [facility] hospital lab) *cepheid single result test* Anterior Nasal Swab     Status: Abnormal   Collection Time: 12/03/23  8:19 AM   Specimen: Anterior Nasal Swab  Result Value Ref Range Status   SARS  Coronavirus 2 by RT PCR POSITIVE (A) NEGATIVE Final    Comment: Performed at [facility], [address] [place], [place] [address]    Radiology Report No results found.    Signature  -   Seena Dadds M.D on 12/07/2023 at 10:02 AM   -  To page go to [email or web address]

## 2023-12-07 NOTE — Progress Notes (Signed)
 [facility]  Date of Admission:  11/27/2023     Reason for Follow Up: COVID-19 virus infection  Total days of antibiotics 10         ASSESSMENT:  Mr. Feiertag TEE was negative for endocarditis in the setting of vancomycin  resistant Enterococcus faecium bacteremia.  Discussed plan of care to change antibiotics to oral linezolid  600 mg p.o. twice daily for an additional 5 days as he has been treated for several days with appropriate therapy while awaiting TEE secondary to incidental finding of COVID-19.  Okay for discharge from ID standpoint.  Standard/universal precautions.  Remaining medical and supportive care per internal medicine.  PLAN:  Change antibiotics to linezolid  600 mg p.o. twice daily for an additional 5 days. No additional follow-up needed. Okay for discharge from ID standpoint. Standard/universal precautions. Remaining medical and supportive care per internal medicine.  ID will sign off.  Please reconsult if needed.  Principal Problem:   COVID-19 virus infection Active Problems:   OSA (obstructive sleep apnea)   Type 2 diabetes mellitus with obesity (HCC)   Chronic diastolic CHF (congestive heart failure) (HCC)   Seizure disorder (HCC)   AF (paroxysmal atrial fibrillation) (HCC)   COPD (chronic obstructive pulmonary disease) (HCC)   S/P TAVR (transcatheter aortic valve replacement)   Hypotension   Obesity, Class III, BMI 40-49.9 (morbid obesity) (HCC)   SIRS (systemic inflammatory response syndrome) (HCC)   Fall from wheelchair   Hyperlipidemia    apixaban   5 mg Oral BID   ARIPiprazole   5 mg Oral QHS   busPIRone   10 mg Oral TID   colchicine   0.6 mg Oral BID   cyanocobalamin   1,000 mcg Oral Daily   fenofibrate   160 mg Oral Daily   fluticasone   2 spray Each Nare Daily   guaiFENesin   600 mg Oral BID   lacosamide   150 mg Oral BID   levETIRAcetam   500 mg Oral BID   linezolid   600 mg Oral Q12H   midodrine   5 mg Oral TID WC    mirtazapine   15 mg Oral QHS   prazosin   2 mg Oral QHS   pregabalin   100 mg Oral TID   rOPINIRole   0.25 mg Oral BID   sodium chloride  flush  3 mL Intravenous Q12H   spironolactone   50 mg Oral Daily   torsemide   20 mg Oral Daily   traZODone   200 mg Oral QHS   umeclidinium bromide   1 puff Inhalation Daily   venlafaxine  XR  150 mg Oral Q breakfast    SUBJECTIVE:  Afebrile overnight with no acute events.  TEE today.  Family visiting at bedside.  Tolerating antibiotics with no adverse side effects.  Feeling well.  Allergies  Allergen Reactions   Niacin And Related Other (See Comments)    Redness, flushing   Ppd [Tuberculin Purified Protein Derivative]     Unknown rxn (MAR)   Tubersol [Tuberculin, Ppd] Other (See Comments)    Unknown rxn (MAR)   Firvanq  [Vancomycin ] Other (See Comments)    Red man syndrome   Vibramycin  [Doxycycline ] Rash     Review of Systems: Review of Systems  Constitutional:  Negative for chills, fever and weight loss.  Respiratory:  Negative for cough, shortness of breath and wheezing.   Cardiovascular:  Negative for chest pain and leg swelling.  Gastrointestinal:  Negative for abdominal pain, constipation, diarrhea, nausea and vomiting.  Skin:  Negative for rash.      OBJECTIVE: Vitals:  12/07/23 1251 12/07/23 1326 12/07/23 1330 12/07/23 1355  BP:  119/80 125/83 117/74  Pulse:  76 78 66  Resp:  14    Temp:  98.5 F (36.9 C)    TempSrc:  Temporal    SpO2: 95% 98% 97% 95%  Weight:      Height:       Body mass index is 43.9 kg/m.  Physical Exam Constitutional:      General: He is not in acute distress.    Appearance: He is well-developed.     Comments: Seated in bed; pleasant.  Cardiovascular:     Rate and Rhythm: Normal rate and regular rhythm.     Heart sounds: Normal heart sounds.  Pulmonary:     Effort: Pulmonary effort is normal.     Breath sounds: Normal breath sounds.  Skin:    General: Skin is warm and dry.  Neurological:      Mental Status: He is alert and oriented to person, place, and time.  Psychiatric:        Behavior: Behavior normal.        Thought Content: Thought content normal.        Judgment: Judgment normal.     Lab Results Lab Results  Component Value Date   WBC 7.1 12/07/2023   HGB 13.9 12/07/2023   HCT 42.3 12/07/2023   MCV 90.4 12/07/2023   PLT 246 12/07/2023    Lab Results  Component Value Date   CREATININE 0.94 12/07/2023   BUN 19 12/07/2023   NA 139 12/07/2023   K 3.7 12/07/2023   CL 105 12/07/2023   CO2 24 12/07/2023    Lab Results  Component Value Date   ALT 25 11/27/2023   AST 25 11/27/2023   ALKPHOS 43 11/27/2023   BILITOT 0.9 11/27/2023     Microbiology: Recent Results (from the past 240 hours)  MRSA Next Gen by PCR, Nasal     Status: None   Collection Time: 11/28/23  5:52 AM   Specimen: Nasal Mucosa; Nasal Swab  Result Value Ref Range Status   MRSA by PCR Next Gen NOT DETECTED NOT DETECTED Final    Comment: (NOTE) The GeneXpert MRSA Assay (FDA approved for NASAL specimens only), is one component of a comprehensive MRSA colonization surveillance program. It is not intended to diagnose MRSA infection nor to guide or monitor treatment for MRSA infections. Test performance is not FDA approved in patients less than 46 years old. Performed at [facility], [address], [place], [place] [address]   Culture, blood (Routine X 2) w Reflex to ID Panel     Status: None   Collection Time: 11/29/23  5:16 AM   Specimen: BLOOD LEFT ARM  Result Value Ref Range Status   Specimen Description BLOOD LEFT ARM  Final   Special Requests   Final    BOTTLES DRAWN AEROBIC AND ANAEROBIC Blood Culture adequate volume   Culture   Final    NO GROWTH 5 DAYS Performed at [facility], [address] [place], [place] [address]    Report Status 12/04/2023 FINAL  Final  Culture, blood (Routine X 2) w Reflex to ID Panel     Status: None   Collection Time: 11/29/23  5:16  AM   Specimen: BLOOD RIGHT HAND  Result Value Ref Range Status   Specimen Description BLOOD RIGHT HAND  Final   Special Requests   Final    BOTTLES DRAWN AEROBIC AND ANAEROBIC  Blood Culture adequate volume   Culture   Final    NO GROWTH 5 DAYS Performed at [facility], [address] [place], [place] [address]    Report Status 12/04/2023 FINAL  Final  SARS Coronavirus 2 by RT PCR (hospital order, performed in [facility] hospital lab) *cepheid single result test* Anterior Nasal Swab     Status: Abnormal   Collection Time: 12/03/23  8:19 AM   Specimen: Anterior Nasal Swab  Result Value Ref Range Status   SARS Coronavirus 2 by RT PCR POSITIVE (A) NEGATIVE Final    Comment: Performed at [facility], [address], [place], [place] [address]    I have personally spent 28 minutes involved in face-to-face and non-face-to-face activities for this patient on the day of the visit. Professional time spent includes the following activities: Preparing to see the patient (review of tests), Obtaining and/or reviewing separately obtained history (admission/discharge record), Performing a medically appropriate examination and/or evaluation , Ordering medications/tests/procedures, referring and communicating with other health care professionals, Documenting clinical information in the EMR, Independently interpreting results (not separately reported), Communicating results to the patient/family/caregiver, Counseling and educating the patient/family/caregiver and Care coordination (not separately reported).   Greg Tommie Bohlken, NP [facility]  12/07/2023  3:43 PM

## 2023-12-07 NOTE — Anesthesia Preprocedure Evaluation (Signed)
 Anesthesia Evaluation  Patient identified by MRN, date of birth, ID band Patient awake    Reviewed: Allergy & Precautions, H&P , NPO status , Patient's Chart, lab work & pertinent test results  Airway Mallampati: II   Neck ROM: full    Dental   Pulmonary shortness of breath, asthma , sleep apnea , COPD, former smoker   breath sounds clear to auscultation       Cardiovascular hypertension, + CAD and +CHF  + Valvular Problems/Murmurs  Rhythm:regular Rate:Normal  S/p TAVR for AS 02/2023.   Neuro/Psych Seizures -,  PSYCHIATRIC DISORDERS Anxiety Depression    CVA    GI/Hepatic   Endo/Other  diabetes, Type 2    Renal/GU      Musculoskeletal  (+) Arthritis ,    Abdominal   Peds  Hematology   Anesthesia Other Findings   Reproductive/Obstetrics                             Anesthesia Physical Anesthesia Plan  ASA: 3  Anesthesia Plan: MAC   Post-op Pain Management:    Induction: Intravenous  PONV Risk Score and Plan: 1 and Propofol  infusion and Treatment may vary due to age or medical condition  Airway Management Planned: Nasal Cannula  Additional Equipment:   Intra-op Plan:   Post-operative Plan:   Informed Consent: I have reviewed the patients History and Physical, chart, labs and discussed the procedure including the risks, benefits and alternatives for the proposed anesthesia with the patient or authorized representative who has indicated his/her understanding and acceptance.     Dental advisory given  Plan Discussed with: CRNA, Anesthesiologist and Surgeon  Anesthesia Plan Comments:        Anesthesia Quick Evaluation

## 2023-12-07 NOTE — Progress Notes (Signed)
 Occupational Therapy Treatment Patient Details Name: Lawrence [name] MRN: [id number] DOB: 09-29-1955 Today's Date: 12/07/2023   History of present illness Pt is a 68 y/o M admitted on 11/27/23 after presenting with c/o unwitnessed fall out of w/c. Pt tested positive for COVID-19. Pt also found to have acute hypotension. Of note, pt with recent admission 3/14-3/19/25 for ARF with hypoxia 2/2 Flu A, PNA & COPD exacerbation. PMH: COPD, aortic stenosis s/p TAVR July 2024, seizure disorder, chronic diastolic CHF, a-fib on Eliquis , CVA, gout, anxiety, BPH, chronic venous statis, morbid obesity, depression, neuropathy, HTN, PTSD, DM2   OT comments  Patient received in supine and off airborne precautions. Patient able to get to EOB with supervision and stood with mod assist +2 into stedy. Patient performed grooming and bathing tasks seated on Stedy pads and standing occasionally with min assist +2. Patient returned to bed due to expected to leave off unit for procedure. Patient will benefit from continued inpatient follow up therapy, <3 hours/day. Acute OT to continue to follow to address established goals to facilitate DC to next venue of care.       If plan is discharge home, recommend the following:  A lot of help with walking and/or transfers;Two people to help with walking and/or transfers;A lot of help with bathing/dressing/bathroom;Two people to help with bathing/dressing/bathroom   Equipment Recommendations  None recommended by OT    Recommendations for Other Services      Precautions / Restrictions Precautions Precautions: Fall Restrictions Weight Bearing Restrictions Per Provider Order: No       Mobility Bed Mobility Overal bed mobility: Needs Assistance Bed Mobility: Supine to Sit, Sit to Supine     Supine to sit: Supervision, HOB elevated, Used rails Sit to supine: Supervision   General bed mobility comments: able to pull self up in bed with bed features     Transfers Overall transfer level: Needs assistance Equipment used: Ambulation equipment used Transfers: Sit to/from Stand Sit to Stand: Mod assist, +2 physical assistance, +2 safety/equipment, From elevated surface           General transfer comment: Stood into [place] with mod assist +2 to power up and min assist +2 from [employer] via Lift Equipment: Stedy   Balance Overall balance assessment: Needs assistance Sitting-balance support: Feet supported, No upper extremity supported Sitting balance-Leahy Scale: Fair     Standing balance support: During functional activity, Reliant on assistive device for balance Standing balance-Leahy Scale: Zero Standing balance comment: brief standing while in [place]                           ADL either performed or assessed with clinical judgement   ADL Overall ADL's : Needs assistance/impaired     Grooming: Wash/dry hands;Wash/dry face;Oral care;Supervision/safety;Sitting Grooming Details (indicate cue type and reason): seated on Stedy, standing occasionally Upper Body Bathing: Minimal assistance;Sitting Upper Body Bathing Details (indicate cue type and reason): on Stedy                         Functional mobility during ADLs: Moderate assistance;+2 for physical assistance (with stedy) General ADL Comments: Patient performed bathing and grooming tasks seated on Stedy pads    Extremity/Trunk Assessment              Vision       Perception     Praxis     Communication Communication Communication: No apparent difficulties  Cognition Arousal: Alert Behavior During Therapy: WFL for tasks assessed/performed Cognition: No apparent impairments                               Following commands: Intact        Cueing   Cueing Techniques: Verbal cues  Exercises      Shoulder Instructions       General Comments VSS on RA    Pertinent Vitals/ Pain       Pain Assessment Pain  Assessment: Faces Faces Pain Scale: Hurts a little bit Pain Location: R knee in standing Pain Descriptors / Indicators: Guarding, Grimacing Pain Intervention(s): Limited activity within patient's tolerance, Monitored during session, Repositioned  Home Living                                          Prior Functioning/Environment              Frequency  Min 1X/week        Progress Toward Goals  OT Goals(current goals can now be found in the care plan section)  Progress towards OT goals: Progressing toward goals  Acute Rehab OT Goals Patient Stated Goal: get stronger OT Goal Formulation: With patient Time For Goal Achievement: 12/12/23 Potential to Achieve Goals: Good ADL Goals Pt Will Perform Lower Body Bathing: with min assist;sitting/lateral leans;bed level;with adaptive equipment Pt Will Perform Lower Body Dressing: with min assist;with adaptive equipment;sitting/lateral leans;bed level Pt/caregiver will Perform Home Exercise Program: Increased strength;Both right and left upper extremity;With theraband;Independently;With written HEP provided  Plan      Co-evaluation                 AM-PAC OT "6 Clicks" Daily Activity     Outcome Measure   Help from another person eating meals?: None Help from another person taking care of personal grooming?: A Little Help from another person toileting, which includes using toliet, bedpan, or urinal?: Total Help from another person bathing (including washing, rinsing, drying)?: A Lot Help from another person to put on and taking off regular upper body clothing?: A Little Help from another person to put on and taking off regular lower body clothing?: Total 6 Click Score: 14    End of Session Equipment Utilized During Treatment: Other (comment) Octaviano Belts)  OT Visit Diagnosis: Unsteadiness on feet (R26.81);Other abnormalities of gait and mobility (R26.89);Muscle weakness (generalized) (M62.81)   Activity  Tolerance Patient tolerated treatment well   Patient Left in bed;with call bell/phone within reach;with bed alarm set;with family/visitor present   Nurse Communication Mobility status        Time: 1610-9604 OT Time Calculation (min): 24 min  Charges: OT General Charges $OT Visit: 1 Visit OT Treatments $Self Care/Home Management : 23-37 mins  Anitra Barn, OTA Acute Rehabilitation Services  Office [phone]   Jovita Nipper 12/07/2023, 1:08 PM

## 2023-12-07 NOTE — Plan of Care (Signed)

## 2023-12-07 NOTE — Transfer of Care (Signed)
 Immediate Anesthesia Transfer of Care Note  Patient: Lawrence [name]  Procedure(s) Performed: TRANSESOPHAGEAL ECHOCARDIOGRAM  Patient Location: PACU  Anesthesia Type:MAC   Level of Consciousness: alert , oriented, and drowsy  Airway & Oxygen Therapy: Patient Spontanous Breathing  Post-op Assessment: Report given to RN and Post -op Vital signs reviewed and stable  Post vital signs: Reviewed and stable  Last Vitals:  Vitals Value Taken Time  BP    Temp    Pulse    Resp    SpO2      Last Pain:  Vitals:   12/07/23 1251  TempSrc:   PainSc: 0-No pain      Patients Stated Pain Goal: 3 (12/04/23 1155)  Complications: No notable events documented.

## 2023-12-07 NOTE — Telephone Encounter (Signed)
 Patient Product/process development scientist completed.    The patient is insured through [employer]. Patient has Medicare and is not eligible for a copay card, but may be able to apply for patient assistance or Medicare RX Payment Plan (Patient Must reach out to their plan, if eligible for payment plan), if available.    Ran test claim for linezolid  600 mg and the current 5 day co-pay is $0.00.   This test claim was processed through [facility]- copay amounts may vary at other pharmacies due to pharmacy/plan contracts, or as the patient moves through the different stages of their insurance plan.     Morgan Arab, CPHT Pharmacy Technician III Certified Patient Advocate [facility] Patient Advocate Team Direct Number: [phone]  Fax: [phone]

## 2023-12-08 DIAGNOSIS — U071 COVID-19: Secondary | ICD-10-CM | POA: Diagnosis not present

## 2023-12-08 LAB — GLUCOSE, CAPILLARY
Glucose-Capillary: 148 mg/dL — ABNORMAL HIGH (ref 70–99)
Glucose-Capillary: 117 mg/dL — ABNORMAL HIGH (ref 70–99)
Glucose-Capillary: 119 mg/dL — ABNORMAL HIGH (ref 70–99)

## 2023-12-08 LAB — BASIC METABOLIC PANEL WITH GFR
Anion gap: 10 (ref 5–15)
BUN: 14 mg/dL (ref 8–23)
CO2: 23 mmol/L (ref 22–32)
Calcium: 8.9 mg/dL (ref 8.9–10.3)
Chloride: 106 mmol/L (ref 98–111)
Creatinine, Ser: 0.99 mg/dL (ref 0.61–1.24)
GFR, Estimated: >60 mL/min (ref >60)
Glucose, Bld: 116 mg/dL — ABNORMAL HIGH (ref 70–99)
Potassium: 3.8 mmol/L (ref 3.5–5.1)
Sodium: 139 mmol/L (ref 135–145)

## 2023-12-08 LAB — CBC WITH DIFFERENTIAL/PLATELET
Abs Immature Granulocytes: 0.08 10*3/uL — ABNORMAL HIGH (ref 0.00–0.07)
Basophils Absolute: 0 10*3/uL (ref 0.0–0.1)
Basophils Relative: 1 %
Eosinophils Absolute: 0.5 10*3/uL (ref 0.0–0.5)
Eosinophils Relative: 7 %
HCT: 40.9 % (ref 39.0–52.0)
Hemoglobin: 13.4 g/dL (ref 13.0–17.0)
Immature Granulocytes: 1 %
Lymphocytes Relative: 30 %
Lymphs Abs: 2.1 10*3/uL (ref 0.7–4.0)
MCH: 29.9 pg (ref 26.0–34.0)
MCHC: 32.8 g/dL (ref 30.0–36.0)
MCV: 91.3 fL (ref 80.0–100.0)
Monocytes Absolute: 0.6 10*3/uL (ref 0.1–1.0)
Monocytes Relative: 9 %
Neutro Abs: 3.6 10*3/uL (ref 1.7–7.7)
Neutrophils Relative %: 52 %
Platelets: 235 10*3/uL (ref 150–400)
RBC: 4.48 MIL/uL (ref 4.22–5.81)
RDW: 13.6 % (ref 11.5–15.5)
WBC: 6.9 10*3/uL (ref 4.0–10.5)
nRBC: 0 % (ref 0.0–0.2)

## 2023-12-08 LAB — MAGNESIUM: Magnesium: 2 mg/dL (ref 1.7–2.4)

## 2023-12-08 MED ORDER — TORSEMIDE 20 MG PO TABS: 20.0000 mg | ORAL_TABLET | Freq: Two times a day (BID) | ORAL | Status: DC

## 2023-12-08 MED ORDER — MIDODRINE HCL 5 MG PO TABS: 5.0000 mg | ORAL_TABLET | Freq: Three times a day (TID) | ORAL | Status: DC

## 2023-12-08 MED ORDER — LINEZOLID 600 MG PO TABS: 600.0000 mg | ORAL_TABLET | Freq: Two times a day (BID) | ORAL | Status: AC

## 2023-12-08 MED ORDER — HYDROCODONE-ACETAMINOPHEN 5-325 MG PO TABS: 1.0000 | ORAL_TABLET | Freq: Four times a day (QID) | ORAL | 0 refills | Status: DC | PRN

## 2023-12-08 NOTE — TOC Progression Note (Addendum)
 Transition of Care [facility]) - Progression Note    Patient Details  Name: Lawrence [name] MRN: [id number] Date of Birth: [birth year]  [facility]) CM/SW Contact  Jannice Mends, LCSW Phone Number: 12/08/2023, 11:56 AM  Clinical Narrative:    [name] able to accept patient back today. CSW will initiate insurance authorization per facility request once updated PT note is in but will not delay discharge.   Insurance authorization started, Ref# [id number].  Expected Discharge Plan: Skilled Nursing Facility Barriers to Discharge: Continued Medical Work up  Expected Discharge Plan and Services In-house Referral: Clinical Social Work   Post Acute Care Choice: Skilled Nursing Facility Living arrangements for the past 2 months: Skilled Nursing Facility                                       Social Determinants of Health (SDOH) Interventions SDOH Screenings   Food Insecurity: No Food Insecurity (11/29/2023)  Housing: Low Risk  (11/29/2023)  Transportation Needs: No Transportation Needs (11/29/2023)  Recent Concern: Transportation Needs - Unmet Transportation Needs (10/03/2023)  Utilities: Not At Risk (11/29/2023)  Alcohol  Screen: Low Risk  (06/08/2021)  Financial Resource Strain: Medium Risk (12/06/2021)   Received from [facility], [facility]  Physical Activity: Unknown (12/06/2021)   Received from [facility], [facility]  Social Connections: Socially Isolated (11/29/2023)  Stress: No Stress Concern Present (01/21/2022)   Received from [facility], [facility]  Recent Concern: Stress - Stress Concern Present (11/19/2021)   Received from [facility]  Tobacco Use: Medium Risk (10/20/2023)    Readmission Risk Interventions    11/28/2023    2:45 PM 07/24/2023   11:56 AM 05/08/2023    4:37 PM  Readmission Risk Prevention Plan  Transportation Screening Complete Complete Complete  Medication Review Oceanographer) Complete Complete Complete  PCP or Specialist  appointment within 3-5 days of discharge Complete Complete Complete  HRI or Home Care Consult Complete Complete Complete  SW Recovery Care/Counseling Consult Complete Complete Complete  Palliative Care Screening Not Applicable Not Applicable Complete  Skilled Nursing Facility Complete Complete Not Applicable

## 2023-12-08 NOTE — TOC Transition Note (Signed)
 Transition of Care [facility]) - Discharge Note   Patient Details  Name: Lawrence [name] MRN: [id number] Date of Birth: 04-15-1956  Transition of Care [facility]) CM/SW Contact:  Jannice Mends, LCSW Phone Number: 12/08/2023, 4:51 PM   Clinical Narrative:    [facility] to[name] Anticipated DC date: 12/08/23 Family notified: [name] Transport by: Lyna Sandhoff called at 3:59pm   Per MD patient ready for DC to [facility]. RN to call report prior to discharge [phone]). RN, patient, patient's family, and facility notified of DC. Discharge Summary and FL2 sent to facility. DC packet on chart including signed script and DNR. Ambulance transport requested for patient.   CSW will sign off for now as social work intervention is no longer needed. Please consult us  again if new needs arise.     Final next level of care: Skilled Nursing Facility Barriers to Discharge: Barriers Resolved   Patient Goals and CMS Choice Patient states their goals for this hospitalization and ongoing recovery are:: Return to SNF          Discharge Placement   Existing PASRR number confirmed : 12/08/23          [facility] at:  [name]) Patient to be transferred to facility by: [facility] Name of family member notified: janet Patient and family notified of of transfer: 12/08/23  Discharge Plan and Services Additional resources added to the After Visit Summary for   In-house Referral: Clinical Social Work   Post Acute Care Choice: Skilled Nursing Facility                               Social Drivers of Health (SDOH) Interventions SDOH Screenings   Food Insecurity: No Food Insecurity (11/29/2023)  Housing: Low Risk  (11/29/2023)  Transportation Needs: No Transportation Needs (11/29/2023)  Recent Concern: Transportation Needs - Unmet Transportation Needs (10/03/2023)  Utilities: Not At Risk (11/29/2023)  Alcohol  Screen: Low Risk  (06/08/2021)  Financial Resource Strain: Medium Risk (12/06/2021)    Received from [facility], [facility]  Physical Activity: Unknown (12/06/2021)   Received [facility], [facility]  Social Connections: Socially Isolated (11/29/2023)  Stress: No Stress Concern Present (01/21/2022)   Received from [facility], [facility]  Recent Concern: Stress - Stress Concern Present (11/19/2021)   Received from [facility]  Tobacco Use: Medium Risk (10/20/2023)     Readmission Risk Interventions    11/28/2023    2:45 PM 07/24/2023   11:56 AM 05/08/2023    4:37 PM  Readmission Risk Prevention Plan  Transportation Screening Complete Complete Complete  Medication Review Oceanographer) Complete Complete Complete  PCP or Specialist appointment within 3-5 days of discharge Complete Complete Complete  HRI or Home Care Consult Complete Complete Complete  SW Recovery Care/Counseling Consult Complete Complete Complete  Palliative Care Screening Not Applicable Not Applicable Complete  Skilled Nursing Facility Complete Complete Not Applicable

## 2023-12-08 NOTE — Discharge Summary (Signed)
 Physician Discharge Summary  Lawrence [name] MWU:[id number] DOB: 1955-12-17 DOA: 11/27/2023  PCP: Darnelle Elders, PA-C  Admit date: 11/27/2023 Discharge date: 12/08/2023  Admitted From: ( SNF) Disposition:  (SNF)  Recommendations for Outpatient Follow-up:  Please obtain BMP/CBC in one week   Diet recommendation: Heart Healthy / Carb Modified   Brief/Interim Summary:  68 y.o. male with medical history significant of COPD, severe aortic stenosis status post TAVR in July 2024, seizure disorder, chronic diastolic CHF (follows Dr. Mitzie Anda), atrial fibrillation on Eliquis , CVA, gout, OSA on nighttime CPAP, anxiety, BPH, chronic venous stasis, and morbid obesity presents after having an unwitnessed fall out of his wheelchair.   Records note patient had just recently been hospitalized 3/14-3/19 for sepsis with acute respiratory failure with hypoxia secondary to influenza A with pneumonia and COPD exacerbation.   The patient, who normally uses a wheelchair for mobility, experienced a fall after leaning forward attempting to pick something off the floor with his pick-up stick and hit his head.  Denied any loss of consciousness.  He was found to be febrile was COVID-19 positive, his only complaint was right foot pain, subsequently his blood cultures came back positive for gram-positive cocci 2 out of 2 when he was admitted.  Workup significant for Enterococcus bacteremia, recommendation for TEE, but given his COVID-positive status cardiology has to wait 14 days before procedure was performed, TEE was negative for any vegetation on 5/1, so he is cleared for discharge on p.o. Zyvox  to finish total of 14 days treatment including hospital IV antibiotic course.  Sepsis, Incidental COVID-19 infection.  And Enterococcus bacteremia -Sepsis due to bacteremia, present on admission - Course of bacteremia is unclear , CT chest abdomen pelvis nonacute, his only complaint is ongoing pain in his right foot which could  be due to underlying gout, x-ray right foot, ID were consulted, commendation has been made for TEE to rule out vegetation given his history of TAVR, had to wait 10 days till he cleared his COVID isolation, TEE was performed 5/1, with no evidence of vegetation/endocarditis, patient was transitioned to p.o. Zyvox  at time of discharge to finish total of 15 days treatment .  Status post TAVR  -No acute issues noted per last echocardiogram from 06/2023, kindly see echo above.   HX of gout, ongoing right foot pain for the last few weeks, twitching in the right leg.  Has history of gout and seizures, stable EEG, stable uric acid level stable, continue home medications on discharge given a trial of IV steroid on 11/28/2023, repeat on 11/29/2023, right foot pain and twitching considerably improved.  X-ray of the right foot nonacute.   Incidental COVID 19 infection.  - Asymptomatic,. - Off COVID-19 isolation as of 5/1   Hypotension  - Improving on midodrine  .   Fall out of wheelchair - CT scan of the head and cervical spine did not note any acute abnormality and chronic changes as previously noted on previous MRI imaging from 3/3.  PT/OT to evaluate and treat.   COPD, without acute exacerbation at this time  Patient does not appear to have active wheezing on physical exam although decreased overall aeration. Albuterol  nebs as needed for shortness of breath/wheezing.   Paroxysmal atrial fibrillation, [place] vasc 2 score of greater than 3 on chronic anticoagulation  Patient appears to be in sinus rhythm at this time. Continue Eliquis .  Not on rate controlling agents.   Chr. Diastolic congestive heart failure - has mild fluid overload which is chronic, on  oral diuretic less than home dose and doing well.  Given soft blood pressure, diuresis dose has been decreased from home dose on discharge   Seizure disorder  - Continue Keppra  and lacosamide , has been having twitching in his right lower extremity for a few  days, unremarkable EEG.   Hyperlipidemia  - Continue fenofibrate  and atorvastatin      Anxiety and depression - Continue trazodone , mirtazapine , buspirone , venlafaxine , and aripiprazole    Morbid obesity - OSA on CPAP . BMI 46.22 kg/m, follow-up with PCP weight loss, he is on Mounjaro  -Mounjaro  on hold   Diabetes mellitus type 2, with long-term use of insulin  -  - Hypoglycemia, so Prandin  has been discontinued, continue with home dose sliding scale and Mounjaro .     Discharge Diagnoses:  Principal Problem:   COVID-19 virus infection Active Problems:   SIRS (systemic inflammatory response syndrome) (HCC)   Hypotension   Fall from wheelchair   Type 2 diabetes mellitus with obesity (HCC)   COPD (chronic obstructive pulmonary disease) (HCC)   AF (paroxysmal atrial fibrillation) (HCC)   Chronic diastolic CHF (congestive heart failure) (HCC)   S/P TAVR (transcatheter aortic valve replacement)   Seizure disorder (HCC)   Hyperlipidemia   OSA (obstructive sleep apnea)   Obesity, Class III, BMI 40-49.9 (morbid obesity)    Discharge Instructions  Discharge Instructions     Diet - low sodium heart healthy   Complete by: As directed    Discharge instructions   Complete by: As directed    Follow with Primary MD Darnelle Elders, PA-C-SNF physician  Get CBC, CMP,  checked  by Primary MD next visit.    Activity: As tolerated with Full fall precautions use walker/cane & assistance as needed   Disposition SNF   Diet: Heart healthy/carb modified, with 1.5 cc fluid restriction per day    On your next visit with your primary care physician please Get Medicines reviewed and adjusted.   Please request your Prim.MD to go over all Hospital Tests and Procedure/Radiological results at the follow up, please get all Hospital records sent to your Prim MD by signing hospital release before you go home.   If you experience worsening of your admission symptoms, develop shortness of  breath, life threatening emergency, suicidal or homicidal thoughts you must seek medical attention immediately by calling 911 or calling your MD immediately  if symptoms less severe.  You Must read complete instructions/literature along with all the possible adverse reactions/side effects for all the Medicines you take and that have been prescribed to you. Take any new Medicines after you have completely understood and accpet all the possible adverse reactions/side effects.   Do not drive, operating heavy machinery, perform activities at heights, swimming or participation in water activities or provide baby sitting services if your were admitted for syncope or siezures until you have seen by Primary MD or a Neurologist and advised to do so again.  Do not drive when taking Pain medications.    Do not take more than prescribed Pain, Sleep and Anxiety Medications  Special Instructions: If you have smoked or chewed Tobacco  in the last 2 yrs please stop smoking, stop any regular Alcohol   and or any Recreational drug use.  Wear Seat belts while driving.   Please note  You were cared for by a hospitalist during your hospital stay. If you have any questions about your discharge medications or the care you received while you were in the hospital after you are discharged, you can  call the unit and asked to speak with the hospitalist on call if the hospitalist that took care of you is not available. Once you are discharged, your primary care physician will handle any further medical issues. Please note that NO REFILLS for any discharge medications will be authorized once you are discharged, as it is imperative that you return to your primary care physician (or establish a relationship with a primary care physician if you do not have one) for your aftercare needs so that they can reassess your need for medications and monitor your lab values.   Increase activity slowly   Complete by: As directed        Allergies as of 12/08/2023       Reactions   Niacin And Related Other (See Comments)   Redness, flushing   Ppd [tuberculin Purified Protein Derivative]    Unknown rxn (MAR)   Tubersol [tuberculin, Ppd] Other (See Comments)   Unknown rxn (MAR)   Firvanq  [vancomycin ] Other (See Comments)   Red man syndrome   Vibramycin  [doxycycline ] Rash        Medication List     STOP taking these medications    metoprolol  succinate 25 MG 24 hr tablet Commonly known as: TOPROL -XL   repaglinide  2 MG tablet Commonly known as: PRANDIN        TAKE these medications    acetaminophen  650 MG CR tablet Commonly known as: TYLENOL  Take 650 mg by mouth every 8 (eight) hours as needed for pain.   albuterol  108 (90 Base) MCG/ACT inhaler Commonly known as: VENTOLIN  HFA Inhale 2 puffs into the lungs every 6 (six) hours as needed for wheezing or shortness of breath.   allopurinol  100 MG tablet Commonly known as: Zyloprim  Take 1 tablet (100 mg total) by mouth daily.   apixaban  5 MG Tabs tablet Commonly known as: Eliquis  Take 1 tablet (5 mg total) by mouth 2 (two) times daily.   ARIPiprazole  5 MG tablet Commonly known as: ABILIFY  TAKE 1 TABLET BY MOUTH EVERYDAY AT BEDTIME   atorvastatin  40 MG tablet Commonly known as: LIPITOR Take 40 mg by mouth at bedtime.   bisacodyl  5 MG EC tablet Commonly known as: DULCOLAX Take 10 mg by mouth daily as needed for moderate constipation.   busPIRone  10 MG tablet Commonly known as: BUSPAR  Take 1 tablet (10 mg total) by mouth 3 (three) times daily.   colchicine  0.6 MG tablet Take 1 tablet (0.6 mg total) by mouth daily.   cyanocobalamin  1000 MCG tablet Commonly known as: VITAMIN B12 Take 1,000 mcg by mouth daily.   fenofibrate  145 MG tablet Commonly known as: Tricor  Take 1 tablet (145 mg total) by mouth daily.   ferrous sulfate  325 (65 FE) MG tablet Take 325 mg by mouth daily.   fluticasone  50 MCG/ACT nasal spray Commonly known as:  FLONASE  Place 2 sprays into both nostrils daily.   guaiFENesin -dextromethorphan 100-10 MG/5ML syrup Commonly known as: ROBITUSSIN DM Take 5 mLs by mouth every 4 (four) hours as needed for cough.   HYDROcodone -acetaminophen  5-325 MG tablet Commonly known as: NORCO/VICODIN Take 1 tablet by mouth every 6 (six) hours as needed for severe pain (pain score 7-10). What changed: reasons to take this   insulin  aspart 100 UNIT/ML injection Commonly known as: novoLOG  Inject 0-15 Units into the skin 3 (three) times daily with meals. Sliding scale  CBG 70 - 120: 0 units: CBG 121 - 150: 2 units; CBG 151 - 200: 3 units; CBG 201 - 250: 5  units; CBG 251 - 300: 8 units;CBG 301 - 350: 11 units; CBG 351 - 400: 15 units; CBG > 400 : 15 units and notify MD   Lacosamide  150 MG Tabs Take 1 tablet (150 mg total) by mouth 2 (two) times daily.   levETIRAcetam  500 MG tablet Commonly known as: KEPPRA  Take 1 tablet (500 mg total) by mouth 2 (two) times daily.   linezolid  600 MG tablet Commonly known as: ZYVOX  Take 1 tablet (600 mg total) by mouth every 12 (twelve) hours for 5 days.   loperamide  2 MG capsule Commonly known as: IMODIUM  Take 2 mg by mouth daily as needed for diarrhea or loose stools.   methocarbamol  500 MG tablet Commonly known as: ROBAXIN  Take 500 mg by mouth every 8 (eight) hours as needed for muscle spasms.   metolazone  2.5 MG tablet Commonly known as: ZAROXOLYN  Take 2.5 mg by mouth once a week. Every Tuesday.   midodrine  5 MG tablet Commonly known as: PROAMATINE  Take 1 tablet (5 mg total) by mouth 3 (three) times daily with meals.   mirtazapine  15 MG tablet Commonly known as: REMERON  Take 15 mg by mouth at bedtime.   Mounjaro  15 MG/0.5ML Pen Generic drug: tirzepatide  INJECT 15 MG INTO THE SKIN ONCE A WEEK. What changed: See the new instructions.   Muscle Rub 10-15 % Crea Apply 1 Application topically as needed for muscle pain. Apply 4gm to knees.   omega-3 acid ethyl esters  1 g capsule Commonly known as: LOVAZA Take 2 g by mouth 2 (two) times daily.   omeprazole 20 MG capsule Commonly known as: PRILOSEC Take 20 mg by mouth daily.   polyethylene glycol powder 17 GM/SCOOP powder Commonly known as: GLYCOLAX /MIRALAX  Mix in water as directed and take 1 capful (17 g) by mouth daily.   potassium chloride  SA 20 MEQ tablet Commonly known as: Klor-Con  M20 Take 1-2 tablets (20-40 mEq total) by mouth See admin instructions. Take 2 tablets in the morning and 1 tablet in the evening What changed:  how much to take when to take this additional instructions   prazosin  2 MG capsule Commonly known as: MINIPRESS  Take 2 mg by mouth at bedtime.   pregabalin  150 MG capsule Commonly known as: LYRICA  Take 1 capsule (150 mg total) by mouth 3 (three) times daily.   rOPINIRole  0.25 MG tablet Commonly known as: REQUIP  Take 1 tablet (0.25 mg total) by mouth 2 (two) times daily.   sennosides-docusate sodium  8.6-50 MG tablet Commonly known as: SENOKOT-S Take 2 tablets by mouth in the morning and at bedtime.   Spiriva  Respimat 2.5 MCG/ACT Aers Generic drug: Tiotropium Bromide  Monohydrate Inhale 2 puffs into the lungs daily.   spironolactone  25 MG tablet Commonly known as: ALDACTONE  Take 12.5 mg by mouth daily.   torsemide  20 MG tablet Commonly known as: DEMADEX  Take 1 tablet (20 mg total) by mouth 2 (two) times daily. What changed: how much to take   traZODone  100 MG tablet Commonly known as: DESYREL  TAKE 2 TABLETS BY MOUTH AT BEDTIME   venlafaxine  XR 150 MG 24 hr capsule Commonly known as: EFFEXOR -XR Take 150 mg by mouth daily with breakfast.        Allergies  Allergen Reactions   Niacin And Related Other (See Comments)    Redness, flushing   Ppd [Tuberculin Purified Protein Derivative]     Unknown rxn (MAR)   Tubersol [Tuberculin, Ppd] Other (See Comments)    Unknown rxn (MAR)   Firvanq  [Vancomycin ] Other (See  Comments)    Red man syndrome    Vibramycin  [Doxycycline ] Rash    Consultations: Infectious disease   Procedures/Studies: ECHO TEE Result Date: 12/07/2023    TRANSESOPHOGEAL ECHO REPORT   Patient Name:   DYKE DUGGINS Date of Exam: 12/07/2023 Medical Rec #:  [id number]        Height:       74.0 in Accession #:    [id number]       Weight:       341.9 lb Date of Birth:  Oct 31, 1955        BSA:          2.730 m Patient Age:    67 years         BP:           139/89 mmHg Patient Gender: M                HR:           83 bpm. Exam Location:  Inpatient Procedure: Transesophageal Echo, 3D Echo, Color Doppler and Cardiac Doppler            (Both Spectral and Color Flow Doppler were utilized during            procedure). Indications:     Fever  History:         Patient has prior history of Echocardiogram examinations, most                  recent 11/29/2023. CHF, COPD; Risk Factors:Hypertension,                  Diabetes, Dyslipidemia and Sleep Apnea.                  Aortic Valve: 34 Medtronic Evolut FX valve is present in the                  aortic position. Procedure Date: 02/07/23.  Sonographer:     Sherline Distel Senior RDCS Referring Phys:  Marcie Sever, NICOLE Diagnosing Phys: Gloriann Larger MD PROCEDURE: After discussion of the risks and benefits of a TEE, an informed consent was obtained from the patient. The transesophogeal probe was passed without difficulty through the esophogus of the patient. Sedation performed by different physician. The patient was monitored while under deep sedation. Anesthestetic sedation was provided intravenously by Anesthesiology: 140mg  of Propofol . The patient developed no complications during the procedure.  IMPRESSIONS  1. Left ventricular ejection fraction, by estimation, is 55 to 60%. The left ventricle has normal function.  2. Right ventricular systolic function is normal. The right ventricular size is normal.  3. No left atrial/left atrial appendage thrombus was detected. The LAA emptying velocity was 52 cm/s.   4. A small pericardial effusion is present. The pericardial effusion is localized near the right ventricle.  5. The mitral valve is normal in structure. Trivial mitral valve regurgitation. No evidence of mitral stenosis.  6. The aortic valve has been repaired/replaced. Aortic valve regurgitation is not visualized. No aortic stenosis is present. There is a 34 Medtronic Evolut FX valve present in the aortic position. Procedure Date: 02/07/23. Echo findings are consistent with normal structure and function of the aortic valve prosthesis.  7. 3D performed of the aortic valve and 3D performed of the mitral valve and demonstrates No vegetations noted.  8. Cannot exclude a small PFO. FINDINGS  Left Ventricle: Left ventricular ejection fraction, by estimation, is 55 to 60%. The left ventricle has  normal function. The left ventricular internal cavity size was normal in size. There is no left ventricular hypertrophy. Right Ventricle: The right ventricular size is normal. No increase in right ventricular wall thickness. Right ventricular systolic function is normal. Left Atrium: Left atrial size was normal in size. No left atrial/left atrial appendage thrombus was detected. The LAA emptying velocity was 52 cm/s. Right Atrium: Right atrial size was normal in size. Pericardium: A small pericardial effusion is present. The pericardial effusion is localized near the right ventricle. Mitral Valve: The mitral valve is normal in structure. Trivial mitral valve regurgitation. No evidence of mitral valve stenosis. Tricuspid Valve: The tricuspid valve is normal in structure. Tricuspid valve regurgitation is mild . No evidence of tricuspid stenosis. Aortic Valve: CoreValve appears under-expanded. Despite this no evidence of PVL or central AI. No vegetation seen on 2D or 3D imaging. The aortic valve has been repaired/replaced. Aortic valve regurgitation is not visualized. No aortic stenosis is present. Aortic valve mean gradient measures  7.0 mmHg. Aortic valve peak gradient measures 11.2 mmHg. There is a 34 Medtronic Evolut FX valve present in the aortic position. Procedure Date: 02/07/23. Echo findings are consistent with normal structure and function of the aortic valve prosthesis. Pulmonic Valve: The pulmonic valve was normal in structure. Pulmonic valve regurgitation is trivial. No evidence of pulmonic stenosis. Aorta: The aortic root, ascending aorta, aortic arch and descending aorta are all structurally normal, with no evidence of dilitation or obstruction. IAS/Shunts: The interatrial septum appears to be lipomatous. Cannot exclude a small PFO. Additional Comments: 3D was performed not requiring image post processing on an independent workstation and was normal. Spectral Doppler performed. AORTIC VALVE AV Vmax:      167.00 cm/s AV Vmean:     122.000 cm/s AV VTI:       0.349 m AV Peak Grad: 11.2 mmHg AV Mean Grad: 7.0 mmHg Gloriann Larger MD Electronically signed by Gloriann Larger MD Signature Date/Time: 12/07/2023/2:06:31 PM    Final    EP STUDY Result Date: 12/07/2023 See surgical note for result.  ECHOCARDIOGRAM COMPLETE Result Date: 11/29/2023    ECHOCARDIOGRAM REPORT   Patient Name:   RANIEL DIGGES Date of Exam: 11/29/2023 Medical Rec #:  [id number]        Height:       74.0 in Accession #:    [id number]       Weight:       354.7 lb Date of Birth:  1956-04-23        BSA:          2.773 m Patient Age:    67 years         BP:           112/54 mmHg Patient Gender: M                HR:           53 bpm. Exam Location:  Inpatient Procedure: 2D Echo, Cardiac Doppler and Color Doppler (Both Spectral and Color            Flow Doppler were utilized during procedure). Indications:    Endocarditis  History:        Patient has prior history of Echocardiogram examinations, most                 recent 06/26/2023. CHF, CAD, COPD, Aortic Valve Disease; Risk                 Factors:Sleep  Apnea, Diabetes and Hypertension.                 Aortic  Valve: 34 Medtronic CoreValve-Evolut Pro prosthetic,                 stented (TAVR) valve is present in the aortic position.  Sonographer:    Astrid Blamer Referring Phys: Gibson Kurtz, N IMPRESSIONS  1. Left ventricular ejection fraction, by estimation, is 60 to 65%. The left ventricle has normal function. The left ventricle has no regional wall motion abnormalities. Left ventricular diastolic function could not be evaluated.  2. Right ventricular systolic function is normal. The right ventricular size is normal. Tricuspid regurgitation signal is inadequate for assessing PA pressure.  3. Moderate pericardial effusion. The pericardial effusion is circumferential.  4. The mitral valve is normal in structure. No evidence of mitral valve regurgitation. No evidence of mitral stenosis.  5. The aortic valve is normal in structure. Aortic valve regurgitation is not visualized. No aortic stenosis is present. There is a 34 Medtronic CoreValve-Evolut Pro prosthetic (TAVR) valve present in the aortic position. Aortic valve area, by VTI measures 1.58 cm. Aortic valve mean gradient measures 19.0 mmHg. Aortic valve Vmax measures 2.63 m/s.  6. The inferior vena cava is dilated in size with <50% respiratory variability, suggesting right atrial pressure of 15 mmHg. Conclusion(s)/Recommendation(s): Images are not adequate to rule out vegetation. Recommend TEE for further evaluation. FINDINGS  Left Ventricle: Left ventricular ejection fraction, by estimation, is 60 to 65%. The left ventricle has normal function. The left ventricle has no regional wall motion abnormalities. The left ventricular internal cavity size was normal in size. There is  no left ventricular hypertrophy. Left ventricular diastolic function could not be evaluated. Right Ventricle: The right ventricular size is normal. No increase in right ventricular wall thickness. Right ventricular systolic function is normal. Tricuspid regurgitation signal is inadequate for  assessing PA pressure. Left Atrium: Left atrial size was normal in size. Right Atrium: Right atrial size was normal in size. Pericardium: A moderately sized pericardial effusion is present. The pericardial effusion is circumferential. Mitral Valve: The mitral valve is normal in structure. Mild mitral annular calcification. No evidence of mitral valve regurgitation. No evidence of mitral valve stenosis. Tricuspid Valve: The tricuspid valve is normal in structure. Tricuspid valve regurgitation is not demonstrated. No evidence of tricuspid stenosis. Aortic Valve: The aortic valve is normal in structure. Aortic valve regurgitation is not visualized. No aortic stenosis is present. Aortic valve mean gradient measures 19.0 mmHg. Aortic valve peak gradient measures 27.7 mmHg. Aortic valve area, by VTI measures 1.58 cm. There is a 34 Medtronic CoreValve-Evolut Pro prosthetic, stented (TAVR) valve present in the aortic position. Pulmonic Valve: The pulmonic valve was normal in structure. Pulmonic valve regurgitation is not visualized. No evidence of pulmonic stenosis. Aorta: The aortic root is normal in size and structure. Venous: The inferior vena cava is dilated in size with less than 50% respiratory variability, suggesting right atrial pressure of 15 mmHg. IAS/Shunts: No atrial level shunt detected by color flow Doppler.  LEFT VENTRICLE PLAX 2D LVIDd:         4.90 cm LVIDs:         3.60 cm LV PW:         1.00 cm LV IVS:        1.20 cm LVOT diam:     1.80 cm LV SV:         97 LV SV Index:  35 LVOT Area:     2.54 cm  RIGHT VENTRICLE RV S prime:     15.70 cm/s TAPSE (M-mode): 2.9 cm LEFT ATRIUM              Index        RIGHT ATRIUM           Index LA Vol (A2C):   104.0 ml 37.51 ml/m  RA Area:     21.20 cm LA Vol (A4C):   59.0 ml  21.28 ml/m  RA Volume:   62.00 ml  22.36 ml/m LA Biplane Vol: 85.5 ml  30.84 ml/m  AORTIC VALVE AV Area (Vmax):    1.34 cm AV Area (Vmean):   1.39 cm AV Area (VTI):     1.58 cm AV Vmax:            263.00 cm/s AV Vmean:          212.000 cm/s AV VTI:            0.617 m AV Peak Grad:      27.7 mmHg AV Mean Grad:      19.0 mmHg LVOT Vmax:         138.00 cm/s LVOT Vmean:        116.000 cm/s LVOT VTI:          0.382 m LVOT/AV VTI ratio: 0.62  SHUNTS Systemic VTI:  0.38 m Systemic Diam: 1.80 cm Gaylyn Keas MD Electronically signed by Gaylyn Keas MD Signature Date/Time: 11/29/2023/4:10:14 PM    Final    EEG adult Result Date: 11/28/2023 Arleene Lack, MD     11/28/2023 12:53 PM Patient Name: Lawrence [name] MRN: [id number] Epilepsy Attending: Arleene Lack Referring Physician/Provider: Cala Castleman, MD Date: 11/28/2023 Duration: 22.33 mins Patient history: 68yo M has been having twitching in his right lower extremity for a few days. EEG to evaluate for seizure Level of alertness: Awake, asleep AEDs during EEG study: LEV, LCM Technical aspects: This EEG study was done with scalp electrodes positioned according to the 10-20 International system of electrode placement. Electrical activity was reviewed with band pass filter of 1-70Hz , sensitivity of 7 uV/mm, display speed of 36mm/sec with a 60Hz  notched filter applied as appropriate. EEG data were recorded continuously and digitally stored.  Video monitoring was available and reviewed as appropriate. Description: The posterior dominant rhythm consists of 7.5 Hz activity of moderate voltage (25-35 uV) seen predominantly in posterior head regions, symmetric and reactive to eye opening and eye closing. Sleep was characterized by vertex waves, sleep spindles (12 to 14 Hz), maximal frontocentral region. Physiologic photic driving was not seen during photic stimulation.  Hyperventilation was not performed.   IMPRESSION: This study is within normal limits. No seizures or definite epileptiform discharges were seen throughout the recording.  A normal interictal EEG does not exclude the diagnosis of epilepsy. Priyanka Suzanne Erps   DG Foot Complete  Right Result Date: 11/28/2023 CLINICAL DATA:  Right foot pain EXAM: RIGHT FOOT COMPLETE - 3+ VIEW COMPARISON:  None Available. 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 IMPRESSION: Osteoarthrosis of the first metatarsophalangeal joint with hallux valgus and bunion deformity. Electronically Signed   By: Fredrich Jefferson M.D.   On: 11/28/2023 12:02   DG Chest Port 1 View Result Date: 11/28/2023 CLINICAL DATA:  Shortness of breath EXAM: PORTABLE CHEST 1 VIEW COMPARISON:  11/27/2023 FINDINGS: Cardiomegaly with vascular  congestion. Diffuse interstitial prominence, favor interstitial edema. Probable layering effusions with bibasilar opacities, likely atelectasis. IMPRESSION: Cardiomegaly with vascular congestion and interstitial prominence, likely interstitial edema. Layering bilateral effusions with bibasilar atelectasis. Electronically Signed   By: Janeece Mechanic M.D.   On: 11/28/2023 10:01   CT CHEST ABDOMEN PELVIS W CONTRAST Result Date: 11/27/2023 CLINICAL DATA:  Poly trauma, fall from wheelchair. Anti coagulation. EXAM: CT CHEST, ABDOMEN, AND PELVIS WITH CONTRAST TECHNIQUE: Multidetector CT imaging of the chest, abdomen and pelvis was performed following the standard protocol during bolus administration of intravenous contrast. Due to patient body habitus, part of the abdomen was not included initial imaging, the patient was readministered contrast and reimaged with a larger field of view. RADIATION DOSE REDUCTION: This exam was performed according to the departmental dose-optimization program which includes automated exposure control, adjustment of the mA and/or kV according to patient size and/or use of iterative reconstruction technique. CONTRAST:  OMNIPAQUE  IOHEXOL  350 MG/ML SOLN COMPARISON:  10/02/2023 FINDINGS: CT CHEST FINDINGS Cardiovascular: Coronary, aortic arch, and  branch vessel atherosclerotic vascular disease. TAVR noted. There is some linear high density in a left lower lobe pulmonary arterial branch on image 5 series 9, likely incidental embolized methacrylate. Mediastinum/Nodes: Unremarkable Lungs/Pleura: Emphysema. Mild lower lobe dependent atelectasis bilaterally. Musculoskeletal: Thoracic spondylosis. CT ABDOMEN PELVIS FINDINGS Hepatobiliary: Simple right hepatic lobe cyst on image 33 series 9 warrants no further imaging workup. Gallbladder unremarkable. Pancreas: Unremarkable Spleen: Unremarkable Adrenals/Urinary Tract: Benign right renal cysts warrant no further imaging workup. Adrenal glands appear normal. Urinary bladder unremarkable. Stomach/Bowel: Sigmoid colon diverticulosis. Vascular/Lymphatic: Atherosclerosis is present, including aortoiliac atherosclerotic disease. Atheromatous plaque proximally in the SMA without overt occlusion. Reproductive: Unremarkable Other: No supplemental non-categorized findings. Musculoskeletal: Dorsal stimulator device noted electrodes at the T12-L1 level. Gluteal and upper thigh muscular atrophy with the exception of the hip adductor musculature. There is some paraspinal muscular atrophy and atrophy of the latissimus dorsi muscles. IMPRESSION: 1. No acute findings. 2. Aortic Atherosclerosis (ICD10-I70.0) and Emphysema (ICD10-J43.9). Coronary and systemic atherosclerosis. 3. Sigmoid colon diverticulosis. 4. Gluteal and upper thigh muscular atrophy with the exception of the hip adductor musculature. There is some paraspinal muscular atrophy and atrophy of the latissimus dorsi muscles. 5. Dorsal stimulator device noted electrodes at the T12-L1 level. Electronically Signed   By: Freida Jes M.D.   On: 11/27/2023 16:05   CT Head Wo Contrast Result Date: 11/27/2023 CLINICAL DATA:  Provided history: Head trauma, GCS = 15, no focal neuro findings. Neck trauma. Additional history provided: Fall on blood thinners. EXAM: CT HEAD  WITHOUT CONTRAST CT CERVICAL SPINE WITHOUT CONTRAST TECHNIQUE: Multidetector CT imaging of the head and cervical spine was performed following the standard protocol without intravenous contrast. Multiplanar CT image reconstructions of the cervical spine were also generated. RADIATION DOSE REDUCTION: This exam was performed according to the departmental dose-optimization program which includes automated exposure control, adjustment of the mA and/or kV according to patient size and/or use of iterative reconstruction technique. COMPARISON:  Prior head CT examinations 10/10/2023 and earlier. Cervical spine MRI 10/09/2023. FINDINGS: CT HEAD FINDINGS Brain: Generalized cerebral atrophy. There is no acute intracranial hemorrhage. No demarcated cortical infarct. No extra-axial fluid collection. No evidence of an intracranial mass. No midline shift. Vascular: No hyperdense vessel.  Atherosclerotic calcifications. Skull: No calvarial fracture or aggressive osseous lesion. Sinuses/Orbits: No mass or acute finding within the imaged orbits. Minimal mucosal thickening within the bilateral maxillary sinuses. Trace mucosal thickening within the right sphenoid sinus. Mild-to-moderate mucosal thickening within the  left sphenoid sinus. Mild-to-moderate bilateral ethmoid sinusitis. Mild-to-moderate mucosal thickening within the left frontal sinus. Other: Forehead hematoma on the right. CT CERVICAL SPINE FINDINGS Alignment: Levocurvature of the cervical spine. 3 mm grade 1 anterolisthesis at C2-C3, C3-C4 and C4-C5. Skull base and vertebrae: The basion-dental and atlanto-dental intervals are maintained.No evidence of acute fracture to the cervical spine. 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 Soft tissues and spinal canal: No prevertebral fluid or swelling. No visible canal hematoma. Disc levels: Cervical spondylosis with multilevel disc space narrowing, disc bulges/central disc protrusions, posterior disc osteophyte complexes,  uncovertebral hypertrophy and facet arthropathy. Erosions are noted along the left C3-C4 facet joint. Disc degeneration is greatest at C5-C6, C6-C7 and C7-T1 (advanced at these levels). Multilevel spinal canal narrowing. Most notably at C3-C4, a disc bulge contributes to multifactorial severe spinal canal stenosis (as was demonstrated on the recent prior cervical spine MRI of 10/09/2023). Multilevel bony foraminal stenosis. Degenerative changes also present at the C1-C2 articulation. Upper chest: No consolidation within the imaged lung apices. No visible pneumothorax. IMPRESSION: CT head: 1.  No evidence of an acute intracranial abnormality. 2. Forehead hematoma on the right. 3. Cerebral atrophy. 4. Paranasal sinus disease as described. CT cervical spine: 1. No evidence of an acute cervical spine fracture. 2. Grade 1 anterolisthesis at C2-C3, C3-C4 and C4-C5. 3. Levocurvature of the cervical spine. 4. Cervical spondylosis as described within the body of the report. Multifactorial severe spinal canal stenosis at C3-C4 (as was demonstrated on the recent prior cervical spine MRI of 10/09/2023). Multilevel bony neural foraminal narrowing. 5. Erosions noted along the left C3-C4 facet joint, a finding which can be seen in setting of CPPD arthropathy. 6. 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 Electronically Signed   By: Bascom Lily D.O.   On: 11/27/2023 13:27   CT Cervical Spine Wo Contrast Result Date: 11/27/2023 CLINICAL DATA:  Provided history: Head trauma, GCS = 15, no focal neuro findings. Neck trauma. Additional history provided: Fall on blood thinners. EXAM: CT HEAD WITHOUT CONTRAST CT CERVICAL SPINE WITHOUT CONTRAST TECHNIQUE: Multidetector CT imaging of the head and cervical spine was performed following the standard protocol without intravenous contrast. Multiplanar CT image reconstructions of the cervical spine were also generated. RADIATION DOSE REDUCTION: This exam was performed according to the  departmental dose-optimization program which includes automated exposure control, adjustment of the mA and/or kV according to patient size and/or use of iterative reconstruction technique. COMPARISON:  Prior head CT examinations 10/10/2023 and earlier. Cervical spine MRI 10/09/2023. FINDINGS: CT HEAD FINDINGS Brain: Generalized cerebral atrophy. There is no acute intracranial hemorrhage. No demarcated cortical infarct. No extra-axial fluid collection. No evidence of an intracranial mass. No midline shift. Vascular: No hyperdense vessel.  Atherosclerotic calcifications. Skull: No calvarial fracture or aggressive osseous lesion. Sinuses/Orbits: No mass or acute finding within the imaged orbits. Minimal mucosal thickening within the bilateral maxillary sinuses. Trace mucosal thickening within the right sphenoid sinus. Mild-to-moderate mucosal thickening within the left sphenoid sinus. Mild-to-moderate bilateral ethmoid sinusitis. Mild-to-moderate mucosal thickening within the left frontal sinus. Other: Forehead hematoma on the right. CT CERVICAL SPINE FINDINGS Alignment: Levocurvature of the cervical spine. 3 mm grade 1 anterolisthesis at C2-C3, C3-C4 and C4-C5. Skull base and vertebrae: The basion-dental and atlanto-dental intervals are maintained.No evidence of acute fracture to the cervical spine. 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 Soft tissues and spinal canal: No prevertebral fluid or swelling. No visible canal hematoma. Disc levels: Cervical spondylosis with multilevel disc space narrowing, disc  bulges/central disc protrusions, posterior disc osteophyte complexes, uncovertebral hypertrophy and facet arthropathy. Erosions are noted along the left C3-C4 facet joint. Disc degeneration is greatest at C5-C6, C6-C7 and C7-T1 (advanced at these levels). Multilevel spinal canal narrowing. Most notably at C3-C4, a disc bulge contributes to multifactorial severe spinal canal stenosis (as was demonstrated on the recent  prior cervical spine MRI of 10/09/2023). Multilevel bony foraminal stenosis. Degenerative changes also present at the C1-C2 articulation. Upper chest: No consolidation within the imaged lung apices. No visible pneumothorax. IMPRESSION: CT head: 1.  No evidence of an acute intracranial abnormality. 2. Forehead hematoma on the right. 3. Cerebral atrophy. 4. Paranasal sinus disease as described. CT cervical spine: 1. No evidence of an acute cervical spine fracture. 2. Grade 1 anterolisthesis at C2-C3, C3-C4 and C4-C5. 3. Levocurvature of the cervical spine. 4. Cervical spondylosis as described within the body of the report. Multifactorial severe spinal canal stenosis at C3-C4 (as was demonstrated on the recent prior cervical spine MRI of 10/09/2023). Multilevel bony neural foraminal narrowing. 5. Erosions noted along the left C3-C4 facet joint, a finding which can be seen in setting of CPPD arthropathy. 6. 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 Electronically Signed   By: Bascom Lily D.O.   On: 11/27/2023 13:27   DG Chest Port 1 View Result Date: 11/27/2023 CLINICAL DATA:  Sepsis EXAM: PORTABLE CHEST 1 VIEW COMPARISON:  10/20/2023. FINDINGS: Enlarged cardiopericardial silhouette. Tortuous ectatic aorta. No consolidation, pneumothorax or effusion. No edema. Prominent central vasculature. Film is under penetrated. Overlapping cardiac leads. Metallic focus along the left side of the heart. Status post TAVR. IMPRESSION: Underinflation with enlarged cardiopericardial silhouette. Prior TAVR. Electronically Signed   By: Adrianna Horde M.D.   On: 11/27/2023 11:30      Subjective: No significant events overnight, he denies any complaints today.  Discharge Exam: Vitals:   12/08/23 0502 12/08/23 0848  BP:    Pulse: (!) 42   Resp: 12   Temp:    SpO2: 94% 96%   Vitals:   12/07/23 1954 12/07/23 2346 12/08/23 0502 12/08/23 0848  BP: (!) 148/81 115/72    Pulse: (!) 45 63 (!) 42   Resp: 19 15 12    Temp: 99.2 F  (37.3 C) 98.9 F (37.2 C)    TempSrc: Oral Oral    SpO2: 92% 90% 94% 96%  Weight:   (!) 155.4 kg   Height:        General: Pt is alert, awake, not in acute distress Cardiovascular: RRR, S1/S2 +, no rubs, no gallops Respiratory: CTA bilaterally, no wheezing, no rhonchi Abdominal: Soft, NT, ND, bowel sounds + Extremities: no edema, no cyanosis    The results of significant diagnostics from this hospitalization (including imaging, microbiology, ancillary and laboratory) are listed below for reference.     Microbiology: Recent Results (from the past 240 hours)  Culture, blood (Routine X 2) w Reflex to ID Panel     Status: None   Collection Time: 11/29/23  5:16 AM   Specimen: BLOOD LEFT ARM  Result Value Ref Range Status   Specimen Description BLOOD LEFT ARM  Final   Special Requests   Final    BOTTLES DRAWN AEROBIC AND ANAEROBIC Blood Culture adequate volume   Culture   Final    NO GROWTH 5 DAYS Performed at [facility], [address], [place], [place] [address]    Report Status 12/04/2023 FINAL  Final  Culture, blood (Routine X 2) w Reflex  to ID Panel     Status: None   Collection Time: 11/29/23  5:16 AM   Specimen: BLOOD RIGHT HAND  Result Value Ref Range Status   Specimen Description BLOOD RIGHT HAND  Final   Special Requests   Final    BOTTLES DRAWN AEROBIC AND ANAEROBIC Blood Culture adequate volume   Culture   Final    NO GROWTH 5 DAYS Performed at [facility], [address], [place], [place] [address]    Report Status 12/04/2023 FINAL  Final  SARS Coronavirus 2 by RT PCR (hospital order, performed in [facility] hospital lab) *cepheid single result test* Anterior Nasal Swab     Status: Abnormal   Collection Time: 12/03/23  8:19 AM   Specimen: Anterior Nasal Swab  Result Value Ref Range Status   SARS Coronavirus 2 by RT PCR POSITIVE (A) NEGATIVE Final    Comment: Performed at [facility], [address], [place], [place] [address]      Labs: BNP (last 3 results) Recent Labs    11/30/23 0511 12/01/23 0442 12/02/23 0516  BNP 144.4* 96.6 64.1   Basic Metabolic Panel: Recent Labs  Lab 12/02/23 0516 12/03/23 0559 12/05/23 0430 12/06/23 0435 12/07/23 0443 12/08/23 0536  NA 141 140 141 139 139 139  K 3.6 3.5 3.7 3.7 3.7 3.8  CL 104 105 106 105 105 106  CO2 28 25 27 24 24 23   GLUCOSE 85 95 94 93 121* 116*  BUN 16 13 14 17 19 14   CREATININE 1.00 0.87 1.01 0.96 0.94 0.99  CALCIUM  8.7* 8.6* 8.9 8.5* 8.9 8.9  MG 2.0  --  2.0 2.1 2.1 2.0   Liver Function Tests: No results for input(s): "AST", "ALT", "ALKPHOS", "BILITOT", "PROT", "ALBUMIN" in the last 168 hours. No results for input(s): "LIPASE", "AMYLASE" in the last 168 hours. No results for input(s): "AMMONIA" in the last 168 hours. CBC: Recent Labs  Lab 12/03/23 0559 12/05/23 0430 12/06/23 0435 12/07/23 0443 12/08/23 0536  WBC 6.6 6.7 6.9 7.1 6.9  NEUTROABS 3.2 3.6 3.2 3.5 3.6  HGB 12.6* 13.3 13.4 13.9 13.4  HCT 38.8* 39.5 40.8 42.3 40.9  MCV 91.7 89.4 91.7 90.4 91.3  PLT 224 231 231 246 235   Cardiac Enzymes: Recent Labs  Lab 12/05/23 0430  CKTOTAL 22*   BNP: Invalid input(s): "POCBNP" CBG: Recent Labs  Lab 12/07/23 1155 12/07/23 1555 12/07/23 2204 12/08/23 0748 12/08/23 1245  GLUCAP 98 223* 220* 148* 117*   D-Dimer No results for input(s): "DDIMER" in the last 72 hours. Hgb A1c No results for input(s): "HGBA1C" in the last 72 hours. Lipid Profile No results for input(s): "CHOL", "HDL", "LDLCALC", "TRIG", "CHOLHDL", "LDLDIRECT" in the last 72 hours. Thyroid  function studies No results for input(s): "TSH", "T4TOTAL", "T3FREE", "THYROIDAB" in the last 72 hours.  Invalid input(s): "FREET3" Anemia work up No results for input(s): "VITAMINB12", "FOLATE", "FERRITIN", "TIBC", "IRON", "RETICCTPCT" in the last 72 hours. Urinalysis    Component Value Date/Time   COLORURINE YELLOW 11/27/2023 0937   APPEARANCEUR CLEAR 11/27/2023 0937    LABSPEC 1.009 11/27/2023 0937   PHURINE 7.0 11/27/2023 0937   GLUCOSEU 50 (A) 11/27/2023 0937   HGBUR NEGATIVE 11/27/2023 0937   BILIRUBINUR NEGATIVE 11/27/2023 0937   KETONESUR NEGATIVE 11/27/2023 0937   PROTEINUR NEGATIVE 11/27/2023 0937   NITRITE NEGATIVE 11/27/2023 0937   LEUKOCYTESUR NEGATIVE 11/27/2023 0937   Sepsis Labs Recent Labs  Lab 12/05/23 0430 12/06/23 0435 12/07/23 0443 12/08/23 0536  WBC 6.7 6.9 7.1 6.9   Microbiology Recent Results (from the past 240 hours)  Culture, blood (Routine X 2) w Reflex to ID Panel     Status: None   Collection Time: 11/29/23  5:16 AM   Specimen: BLOOD LEFT ARM  Result Value Ref Range Status   Specimen Description BLOOD LEFT ARM  Final   Special Requests   Final    BOTTLES DRAWN AEROBIC AND ANAEROBIC Blood Culture adequate volume   Culture   Final    NO GROWTH 5 DAYS Performed at [facility], [address], [place], [place] [address]    Report Status 12/04/2023 FINAL  Final  Culture, blood (Routine X 2) w Reflex to ID Panel     Status: None   Collection Time: 11/29/23  5:16 AM   Specimen: BLOOD RIGHT HAND  Result Value Ref Range Status   Specimen Description BLOOD RIGHT HAND  Final   Special Requests   Final    BOTTLES DRAWN AEROBIC AND ANAEROBIC Blood Culture adequate volume   Culture   Final    NO GROWTH 5 DAYS Performed at [facility], [address], [place], [place] [address]    Report Status 12/04/2023 FINAL  Final  SARS Coronavirus 2 by RT PCR (hospital order, performed in [facility] hospital lab) *cepheid single result test* Anterior Nasal Swab     Status: Abnormal   Collection Time: 12/03/23  8:19 AM   Specimen: Anterior Nasal Swab  Result Value Ref Range Status   SARS Coronavirus 2 by RT PCR POSITIVE (A) NEGATIVE Final    Comment: Performed at [facility], [address], [place], [place] [address]     Time coordinating discharge: Over 30 minutes  SIGNED:   Seena Dadds,  MD  [facility] 12/08/2023, 1:01 PM Pager   If 7PM-7AM, please contact night-coverage [email or web address] Password [email or web address]
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
## 2023-12-08 NOTE — Plan of Care (Signed)
 Adequate for rehab

## 2023-12-08 NOTE — Progress Notes (Signed)
 Called [employer] and gave report to [employer].  Roe Clare, RN

## 2023-12-08 NOTE — Progress Notes (Signed)
 Physical Therapy Treatment Patient Details Name: Lawrence [name] MRN: [id number] DOB: [birth year] Today's Date: 12/08/2023   History of Present Illness Pt is a 68 y/o M admitted on 11/27/23 after presenting with c/o unwitnessed fall out of w/c. Pt tested positive for COVID-19. Pt also found to have acute hypotension. Of note, pt with recent admission 3/14-3/19/25 for ARF with hypoxia 2/2 Flu A, PNA & COPD exacerbation. PMH: COPD, aortic stenosis s/p TAVR July 2024, seizure disorder, chronic diastolic CHF, a-fib on Eliquis , CVA, gout, anxiety, BPH, chronic venous statis, morbid obesity, depression, neuropathy, HTN, PTSD, DM2    PT Comments  Tolerated well; sit to stand transfer training with Stedy, mod assist from elevated surfaces. Knees continue to buckle with full weight bearing, needing bil Knee block. Transferred to recliner (lift pad in place for hoyer.) Patient will continue to benefit from skilled physical therapy services to further improve independence with functional mobility.     If plan is discharge home, recommend the following: Two people to help with walking and/or transfers;Two people to help with bathing/dressing/bathroom   Can travel by private vehicle     No  Equipment Recommendations  Other (comment) (defer to next venue)    Recommendations for Other Services       Precautions / Restrictions Precautions Precautions: Fall Restrictions Weight Bearing Restrictions Per Provider Order: No     Mobility  Bed Mobility Overal bed mobility: Needs Assistance Bed Mobility: Supine to Sit     Supine to sit: Supervision, HOB elevated, Used rails     General bed mobility comments: Supervision for safety, extra time but able to perform without physical assist.    Transfers Overall transfer level: Needs assistance Equipment used: Ambulation equipment used Transfers: Sit to/from Stand Sit to Stand: Mod assist, From elevated surface, Via lift equipment            General transfer comment: Mod assist for boost to stand from bed and paddles of stedy from perched position several times. Knees buckle. Needs assist for set-up and foot placement to maximize control and comfort in standing with bil knees blocked by device. Max multimodal cues for upright posture, hip exension. Transferred to recliner. Transfer via Lift Equipment: Stedy  Ambulation/Gait                   Stairs             Wheelchair Mobility     Tilt Bed    Modified Rankin (Stroke Patients Only)       Balance Overall balance assessment: Needs assistance Sitting-balance support: Feet supported, No upper extremity supported Sitting balance-Leahy Scale: Fair     Standing balance support: During functional activity, Reliant on assistive device for balance Standing balance-Leahy Scale: Zero Standing balance comment: Standing while in [employer] Communication: No apparent difficulties  Cognition Arousal: Alert Behavior During Therapy: WFL for tasks assessed/performed   PT - Cognitive impairments: No apparent impairments                         Following commands: Intact      Cueing Cueing Techniques: Verbal cues  Exercises      General Comments General comments (skin integrity, edema, etc.): SpO2 with variable waveform, avg 88-92%, no dyspnea. HR in 70s. on 2L  Pertinent Vitals/Pain Pain Assessment Pain Assessment: Faces Faces Pain Scale: Hurts a little bit Pain Location: R knee in standing Pain Descriptors / Indicators: Guarding, Grimacing Pain Intervention(s): Monitored during session, Repositioned, Limited activity within patient's tolerance    Home Living                          Prior Function            PT Goals (current goals can now be found in the care plan section) Acute Rehab PT Goals Patient Stated Goal: get stronger, walk again PT Goal  Formulation: With patient Time For Goal Achievement: 12/12/23 Potential to Achieve Goals: Poor Progress towards PT goals: Progressing toward goals    Frequency    Min 2X/week      PT Plan      Co-evaluation              AM-PAC PT "6 Clicks" Mobility   Outcome Measure  Help needed turning from your back to your side while in a flat bed without using bedrails?: A Little Help needed moving from lying on your back to sitting on the side of a flat bed without using bedrails?: A Little Help needed moving to and from a bed to a chair (including a wheelchair)?: Total Help needed standing up from a chair using your arms (e.g., wheelchair or bedside chair)?: Total Help needed to walk in hospital room?: Total Help needed climbing 3-5 steps with a railing? : Total 6 Click Score: 10    End of Session Equipment Utilized During Treatment: Gait belt Activity Tolerance: Patient tolerated treatment well Patient left: with call bell/phone within reach;in chair;with chair alarm set [place] [place] [employer] pad in place) Nurse Communication: Need for lift equipment;Mobility status PT Visit Diagnosis: Muscle weakness (generalized) (M62.81);History of falling (Z91.81);Difficulty in walking, not elsewhere classified (R26.2);Unsteadiness on feet (R26.81)     Time: 5784-6962 PT Time Calculation (min) (ACUTE ONLY): 19 min  Charges:    $Therapeutic Activity: 8-22 mins PT General Charges $$ ACUTE PT VISIT: 1 Visit                     Jory Ng, PT, DPT [facility]  Rehabilitation Services Physical Therapist Office: [phone] Website: [email or web address]    Alinda Irani 12/08/2023, 3:18 PM

## 2023-12-08 NOTE — Anesthesia Postprocedure Evaluation (Signed)
 Anesthesia Post Note  Patient: Lawrence [name]  Procedure(s) Performed: TRANSESOPHAGEAL ECHOCARDIOGRAM     Patient location during evaluation: Cath Lab Anesthesia Type: MAC Level of consciousness: awake and alert Pain management: pain level controlled Vital Signs Assessment: post-procedure vital signs reviewed and stable Respiratory status: spontaneous breathing, nonlabored ventilation, respiratory function stable and patient connected to nasal cannula oxygen Cardiovascular status: stable and blood pressure returned to baseline Postop Assessment: no apparent nausea or vomiting Anesthetic complications: no   No notable events documented.  Last Vitals:  Vitals:   12/08/23 0848 12/08/23 1500  BP:  (!) 118/55  Pulse:  80  Resp:  20  Temp:    SpO2: 96% 97%    Last Pain:  Vitals:   12/08/23 1619  TempSrc:   PainSc: 4                  Fritz Cauthon S

## 2023-12-10 DIAGNOSIS — R279 Unspecified lack of coordination: Secondary | ICD-10-CM | POA: Diagnosis not present

## 2023-12-10 DIAGNOSIS — M6281 Muscle weakness (generalized): Secondary | ICD-10-CM | POA: Diagnosis not present

## 2023-12-11 ENCOUNTER — Encounter (HOSPITAL_COMMUNITY): Payer: Self-pay | Admitting: Cardiology

## 2023-12-11 DIAGNOSIS — R279 Unspecified lack of coordination: Secondary | ICD-10-CM | POA: Diagnosis not present

## 2023-12-11 DIAGNOSIS — M6281 Muscle weakness (generalized): Secondary | ICD-10-CM | POA: Diagnosis not present

## 2023-12-11 NOTE — Progress Notes (Signed)
 5/5: CSW received insurance denial for SNF and relayed appeal info to [place].  Israella Hubert LCSW, MSW, [facility]

## 2023-12-12 ENCOUNTER — Other Ambulatory Visit (HOSPITAL_COMMUNITY): Payer: Self-pay | Admitting: Cardiology

## 2023-12-12 DIAGNOSIS — M6281 Muscle weakness (generalized): Secondary | ICD-10-CM | POA: Diagnosis not present

## 2023-12-12 DIAGNOSIS — R279 Unspecified lack of coordination: Secondary | ICD-10-CM | POA: Diagnosis not present

## 2023-12-13 DIAGNOSIS — I63542 Cerebral infarction due to unspecified occlusion or stenosis of left cerebellar artery: Secondary | ICD-10-CM | POA: Diagnosis not present

## 2023-12-13 DIAGNOSIS — M6281 Muscle weakness (generalized): Secondary | ICD-10-CM | POA: Diagnosis not present

## 2023-12-13 DIAGNOSIS — R262 Difficulty in walking, not elsewhere classified: Secondary | ICD-10-CM | POA: Diagnosis not present

## 2023-12-13 DIAGNOSIS — I5030 Unspecified diastolic (congestive) heart failure: Secondary | ICD-10-CM | POA: Diagnosis not present

## 2023-12-13 DIAGNOSIS — R279 Unspecified lack of coordination: Secondary | ICD-10-CM | POA: Diagnosis not present

## 2023-12-13 DIAGNOSIS — I69811 Memory deficit following other cerebrovascular disease: Secondary | ICD-10-CM | POA: Diagnosis not present

## 2023-12-14 ENCOUNTER — Telehealth: Payer: 59 | Admitting: Adult Health

## 2023-12-14 ENCOUNTER — Encounter: Payer: Self-pay | Admitting: Adult Health

## 2023-12-14 DIAGNOSIS — F411 Generalized anxiety disorder: Secondary | ICD-10-CM

## 2023-12-14 DIAGNOSIS — G4700 Insomnia, unspecified: Secondary | ICD-10-CM

## 2023-12-14 DIAGNOSIS — F410 Panic disorder [episodic paroxysmal anxiety] without agoraphobia: Secondary | ICD-10-CM | POA: Diagnosis not present

## 2023-12-14 DIAGNOSIS — R279 Unspecified lack of coordination: Secondary | ICD-10-CM | POA: Diagnosis not present

## 2023-12-14 DIAGNOSIS — F331 Major depressive disorder, recurrent, moderate: Secondary | ICD-10-CM

## 2023-12-14 DIAGNOSIS — M6281 Muscle weakness (generalized): Secondary | ICD-10-CM | POA: Diagnosis not present

## 2023-12-14 DIAGNOSIS — F4310 Post-traumatic stress disorder, unspecified: Secondary | ICD-10-CM | POA: Diagnosis not present

## 2023-12-14 NOTE — Progress Notes (Signed)
 Lawrence [name] [id number] [birth year] 68 y.o.  Virtual Visit via Video Note  I connected with pt @ on 12/14/23 at  9:00 AM EDT by a video enabled telemedicine application and verified that I am speaking with the correct person using two identifiers.   I discussed the limitations of evaluation and management by telemedicine and the availability of in person appointments. The patient expressed understanding and agreed to proceed.  I discussed the assessment and treatment plan with the patient. The patient was provided an opportunity to ask questions and all were answered. The patient agreed with the plan and demonstrated an understanding of the instructions.   The patient was advised to call back or seek an in-person evaluation if the symptoms worsen or if the condition fails to improve as anticipated.  I provided 25 minutes of non-face-to-face time during this encounter.  The patient was located at home.  The provider was located at [facility].   Lawrence Camera, NP   Subjective:   Patient ID:  Lawrence [name] is a 68 y.o. (DOB Apr 03, 1956) male.  Chief Complaint: No chief complaint on file.   HPI Lawrence [name] presents for follow-up of panic attacks, MDD, GAD, PTSD and insomnia.  Previously followed by [place].  Reports recent hospitalization for and blood infection and Covid.   Describes mood today as "not too god". Pleasant. Flat. Reports tearfulness. Mood symptoms - reports depression, anxiety and irritability. Reports varying interest and activities. Denies recent panic attacks. Reports some over thinking. Denies worry and rumination. Reports recent hospitalization leading to LTC placement - now living at [facility] in [place]. Reports mood is lower. Stating "my mood is not all that great". Feels like current medications are helpful. He and partner doing well - partner living at their apartment. Taking medications as prescribed.  Energy levels lower. Active,  working with P/T every week day.  Enjoys some usual interests and activities. Has a significant other. No family local.  Appetite adequate. Weight loss 344 from 360 pounds. Sleeps better some nights than others. Averages 4 hours a night and naps a few hours during the day. Reports focus and concentration has improved. Completing tasks. Is now staying in a LTC facility. Retired [employer]. Disabled. Denies SI or HI.  Denies AH or VH. Denies self harm. Denies substance use.  Previous medication trials: Ambien    Review of Systems:  Review of Systems  Musculoskeletal:  Negative for gait problem.  Neurological:  Negative for tremors.  Psychiatric/Behavioral:         Please refer to HPI    Medications: I have reviewed the patient's current medications.  Current Outpatient Medications  Medication Sig Dispense Refill   acetaminophen  (TYLENOL ) 650 MG CR tablet Take 650 mg by mouth every 8 (eight) hours as needed for pain.     albuterol  (VENTOLIN  HFA) 108 (90 Base) MCG/ACT inhaler Inhale 2 puffs into the lungs every 6 (six) hours as needed for wheezing or shortness of breath. 1 each 6   allopurinol  (ZYLOPRIM ) 100 MG tablet Take 1 tablet (100 mg total) by mouth daily.     apixaban  (ELIQUIS ) 5 MG TABS tablet Take 1 tablet (5 mg total) by mouth 2 (two) times daily. 180 tablet 3   ARIPiprazole  (ABILIFY ) 5 MG tablet TAKE 1 TABLET BY MOUTH EVERYDAY AT BEDTIME 60 tablet 2   atorvastatin  (LIPITOR) 40 MG tablet Take 40 mg by mouth at bedtime.     bisacodyl  (DULCOLAX) 5 MG EC tablet Take 10  mg by mouth daily as needed for moderate constipation.     busPIRone  (BUSPAR ) 10 MG tablet Take 1 tablet (10 mg total) by mouth 3 (three) times daily. 270 tablet 0   colchicine  0.6 MG tablet Take 1 tablet (0.6 mg total) by mouth daily. 30 tablet 0   cyanocobalamin  (VITAMIN B12) 1000 MCG tablet Take 1,000 mcg by mouth daily.     fenofibrate  (TRICOR ) 145 MG tablet Take 1 tablet (145 mg total) by mouth daily. 90  tablet 3   ferrous sulfate  325 (65 FE) MG tablet Take 325 mg by mouth daily.     fluticasone  (FLONASE ) 50 MCG/ACT nasal spray Place 2 sprays into both nostrils daily.      guaiFENesin -dextromethorphan (ROBITUSSIN DM) 100-10 MG/5ML syrup Take 5 mLs by mouth every 4 (four) hours as needed for cough.     HYDROcodone -acetaminophen  (NORCO/VICODIN) 5-325 MG tablet Take 1 tablet by mouth every 6 (six) hours as needed for severe pain (pain score 7-10). 10 tablet 0   insulin  aspart (NOVOLOG ) 100 UNIT/ML injection Inject 0-15 Units into the skin 3 (three) times daily with meals. Sliding scale  CBG 70 - 120: 0 units: CBG 121 - 150: 2 units; CBG 151 - 200: 3 units; CBG 201 - 250: 5 units; CBG 251 - 300: 8 units;CBG 301 - 350: 11 units; CBG 351 - 400: 15 units; CBG > 400 : 15 units and notify MD     Lacosamide  150 MG TABS Take 1 tablet (150 mg total) by mouth 2 (two) times daily. 10 tablet 0   levETIRAcetam  (KEPPRA ) 500 MG tablet Take 1 tablet (500 mg total) by mouth 2 (two) times daily. 180 tablet 3   loperamide  (IMODIUM ) 2 MG capsule Take 2 mg by mouth daily as needed for diarrhea or loose stools.     Menthol -Methyl Salicylate  (MUSCLE RUB) 10-15 % CREA Apply 1 Application topically as needed for muscle pain. Apply 4gm to knees.     methocarbamol  (ROBAXIN ) 500 MG tablet Take 500 mg by mouth every 8 (eight) hours as needed for muscle spasms.     metolazone  (ZAROXOLYN ) 2.5 MG tablet Take 2.5 mg by mouth once a week. Every Tuesday.     midodrine  (PROAMATINE ) 5 MG tablet Take 1 tablet (5 mg total) by mouth 3 (three) times daily with meals.     mirtazapine  (REMERON ) 15 MG tablet Take 15 mg by mouth at bedtime.     omega-3 acid ethyl esters (LOVAZA) 1 g capsule Take 2 g by mouth 2 (two) times daily.     omeprazole (PRILOSEC) 20 MG capsule Take 20 mg by mouth daily.     polyethylene glycol powder (GLYCOLAX /MIRALAX ) 17 GM/SCOOP powder Mix in water as directed and take 1 capful (17 g) by mouth daily. 238 g 0   potassium  chloride SA (KLOR-CON  M20) 20 MEQ tablet Take 1-2 tablets (20-40 mEq total) by mouth See admin instructions. Take 2 tablets in the morning and 1 tablet in the evening (Patient taking differently: Take 40 mEq by mouth daily.)     prazosin  (MINIPRESS ) 2 MG capsule Take 2 mg by mouth at bedtime.     pregabalin  (LYRICA ) 150 MG capsule Take 1 capsule (150 mg total) by mouth 3 (three) times daily. 15 capsule 0   rOPINIRole  (REQUIP ) 0.25 MG tablet Take 1 tablet (0.25 mg total) by mouth 2 (two) times daily.     sennosides-docusate sodium  (SENOKOT-S) 8.6-50 MG tablet Take 2 tablets by mouth in the morning and  at bedtime.     spironolactone  (ALDACTONE ) 25 MG tablet Take 12.5 mg by mouth daily.     Tiotropium Bromide  Monohydrate (SPIRIVA  RESPIMAT) 2.5 MCG/ACT AERS Inhale 2 puffs into the lungs daily.     tirzepatide  (MOUNJARO ) 15 MG/0.5ML Pen INJECT 15 MG INTO THE SKIN ONCE A WEEK. (Patient taking differently: Inject 15 mg into the skin once a week. Inject on Wednesday) 6 mL 0   torsemide  (DEMADEX ) 20 MG tablet Take 1 tablet (20 mg total) by mouth 2 (two) times daily.     traZODone  (DESYREL ) 100 MG tablet TAKE 2 TABLETS BY MOUTH AT BEDTIME 60 tablet 1   venlafaxine  XR (EFFEXOR -XR) 150 MG 24 hr capsule Take 150 mg by mouth daily with breakfast.     No current facility-administered medications for this visit.    Medication Side Effects: None  Allergies:  Allergies  Allergen Reactions   Niacin And Related Other (See Comments)    Redness, flushing   Ppd [Tuberculin Purified Protein Derivative]     Unknown rxn (MAR)   Tubersol [Tuberculin, Ppd] Other (See Comments)    Unknown rxn (MAR)   Firvanq  [Vancomycin ] Other (See Comments)    Red man syndrome   Vibramycin  [Doxycycline ] Rash    Past Medical History:  Diagnosis Date   Acquired dilation of ascending aorta and aortic root (HCC)    40mm by echo 01/2021   Adenomatous colon polyp 2007   Anemia    Anxiety    Asthma    BPH without  obstruction/lower urinary tract symptoms 02/22/2017   Chronic diastolic (congestive) heart failure (HCC)    Chronic venous stasis 03/07/2019   COPD (chronic obstructive pulmonary disease) (HCC)    Coronary artery calcification seen on CAT scan    Depression    Diabetic neuropathy (HCC) 09/11/2019   History of colon polyps 08/24/2018   Hypertension    Morbid obesity (HCC)    OSA (obstructive sleep apnea)    Pain due to onychomycosis of toenails of both feet 09/11/2019   Peripheral neuropathy 02/22/2017   Primary osteoarthritis, left shoulder 03/05/2017   PTSD (post-traumatic stress disorder)    Pure hypercholesterolemia    QT prolongation 03/07/2019   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 Lorie Rook and Dr. Sherene Dilling   Seizures [facility])    Severe aortic stenosis    Sinus tachycardia 03/07/2019   Sleep apnea    CPAP   Type 2 diabetes mellitus with vascular disease (HCC) 09/11/2019    Family History  Problem Relation Age of Onset   CAD Maternal Grandfather    Diabetes Other    Diabetes Mellitus II Neg Hx    Colon cancer Neg Hx    Esophageal cancer Neg Hx    Inflammatory bowel disease Neg Hx    Liver disease Neg Hx    Pancreatic cancer Neg Hx    Rectal cancer Neg Hx    Stomach cancer Neg Hx    Sleep apnea Neg Hx     Social History   Socioeconomic History   Marital status: Significant Other    Spouse name: Brenita Callow   Number of children: 1   Years of education: Not on file   Highest education level: Associate degree: academic program  Occupational History   Occupation: retired    Comment: Building control surveyor  Tobacco Use   Smoking status: Former    Current packs/day: 0.00    Average packs/day: 1 pack/day  for 44.0 years (44.0 ttl pk-yrs)    Types: Cigarettes    Start date: 03/07/1972    Quit date: 03/07/2016    Years since quitting: 7.7    Passive exposure: Past   Smokeless tobacco: Never  Vaping Use    Vaping status: Never Used  Substance and Sexual Activity   Alcohol  use: Yes    Comment: rare   Drug use: No   Sexual activity: Not Currently  Other Topics Concern   Not on file  Social History Narrative   Lives with wife [name]   Right Handed   Drinks 1-2 cups caffeine   Social Drivers of Health   Financial Resource Strain: Medium Risk (12/06/2021)   Received from [facility], [facility]   Overall Financial Resource Strain (CARDIA)    Difficulty of Paying Living Expenses: Somewhat hard  Food Insecurity: No Food Insecurity (11/29/2023)   Hunger Vital Sign    Worried About Running Out of Food in the Last Year: Never true    Ran Out of Food in the Last Year: Never true  Transportation Needs: No Transportation Needs (11/29/2023)   PRAPARE - Administrator, Civil Service (Medical): No    Lack of Transportation (Non-Medical): No  Recent Concern: Transportation Needs - Unmet Transportation Needs (10/03/2023)   PRAPARE - Administrator, Civil Service (Medical): Yes    Lack of Transportation (Non-Medical): No  Physical Activity: Unknown (12/06/2021)   Received from [facility], [facility]   Exercise Vital Sign    Days of Exercise per Week: Patient declined    Minutes of Exercise per Session: 10 min  Stress: No Stress Concern Present (01/21/2022)   Received from [facility], [facility] of Occupational Health - Occupational Stress Questionnaire    Feeling of Stress : Only a little  Recent Concern: Stress - Stress Concern Present (11/19/2021)   Received from [facility] of Occupational Health - Occupational Stress Questionnaire    Feeling of Stress : To some extent  Social Connections: Socially Isolated (11/29/2023)   Social Connection and Isolation Panel [NHANES]    Frequency of Communication with Friends and Family: Once a week    Frequency of Social Gatherings with Friends and Family: Once a week    Attends  Religious Services: Never    Database administrator or Organizations: No    Attends Banker Meetings: Never    Marital Status: Divorced  Catering manager Violence: Not At Risk (11/29/2023)   Humiliation, Afraid, Rape, and Kick questionnaire    Fear of Current or Ex-Partner: No    Emotionally Abused: No    Physically Abused: No    Sexually Abused: No    Past Medical History, Surgical history, Social history, and Family history were reviewed and updated as appropriate.   Please see review of systems for further details on the patient's review from today.   Objective:   Physical Exam:  There were no vitals taken for this visit.  Physical Exam Constitutional:      General: He is not in acute distress. Musculoskeletal:        General: No deformity.  Neurological:     Mental Status: He is alert and oriented to person, place, and time.     Coordination: Coordination normal.  Psychiatric:        Attention and Perception: Attention and perception normal. He does not perceive auditory or visual hallucinations.  Mood and Affect: Affect is not labile, blunt, angry or inappropriate.        Speech: Speech normal.        Behavior: Behavior normal.        Thought Content: Thought content normal. Thought content is not paranoid or delusional. Thought content does not include homicidal or suicidal ideation. Thought content does not include homicidal or suicidal plan.        Cognition and Memory: Cognition and memory normal.        Judgment: Judgment normal.     Comments: Insight intact     Lab Review:     Component Value Date/Time   NA 139 12/08/2023 0536   NA 136 02/15/2023 1415   K 3.8 12/08/2023 0536   CL 106 12/08/2023 0536   CO2 23 12/08/2023 0536   GLUCOSE 116 (H) 12/08/2023 0536   BUN 14 12/08/2023 0536   BUN 37 (H) 02/15/2023 1415   CREATININE 0.99 12/08/2023 0536   CALCIUM  8.9 12/08/2023 0536   PROT 6.1 (L) 11/27/2023 0933   ALBUMIN 3.3 (L) 11/27/2023  0933   AST 25 11/27/2023 0933   ALT 25 11/27/2023 0933   ALKPHOS 43 11/27/2023 0933   BILITOT 0.9 11/27/2023 0933   GFRNONAA >60 12/08/2023 0536   GFRAA 77 05/20/2020 1038       Component Value Date/Time   WBC 6.9 12/08/2023 0536   RBC 4.48 12/08/2023 0536   HGB 13.4 12/08/2023 0536   HCT 40.9 12/08/2023 0536   PLT 235 12/08/2023 0536   MCV 91.3 12/08/2023 0536   MCH 29.9 12/08/2023 0536   MCHC 32.8 12/08/2023 0536   RDW 13.6 12/08/2023 0536   LYMPHSABS 2.1 12/08/2023 0536   MONOABS 0.6 12/08/2023 0536   EOSABS 0.5 12/08/2023 0536   BASOSABS 0.0 12/08/2023 0536    No results found for: "POCLITH", "LITHIUM"   No results found for: "PHENYTOIN", "PHENOBARB", "VALPROATE", "CBMZ"   .res Assessment: Plan:    Plan:  PDMP reviewed  Currently in LTC facility - he is unsure about current medication regimen, but will ask staff to provide a current medication list.  Previous medications: Prazosin  2mg  - 2 tabs at hs Abilify  5mg  daily Effexor  XR 75mg  - 3 every morning Buspar  5mg  TID  Mirtazapine  15mg  at hs Trazadone 200mg  at hs  Solomon Dupre - [facility] - pain management - spinal stimulator  RTC 4 weeks  25 minutes spent dedicated to the care of this patient on the date of this encounter to include pre-visit review of records, ordering of medication, post visit documentation, and face-to-face time with the patient discussing panic attacks, MDD, GAD, PTSD, and insomnia. Discussed continuing current medication regimen.  Patient advised to contact office with any questions, adverse effects, or acute worsening in signs and symptoms.  Discussed potential metabolic side effects associated with atypical antipsychotics, as well as potential risk for movement side effects. Advised pt to contact office if movement side effects occur.   There are no diagnoses linked to this encounter.   Please see After Visit Summary for patient specific instructions.  Future Appointments  Date  Time Provider Department Center  12/14/2023  9:00 AM Royetta Probus, Ursula Gardner, NP CP-CP None  01/18/2024  1:00 PM HVC-ECHO 5 HVC-ECHO H&V  01/18/2024  2:20 PM Ardia Kraft, PA-C CVD-MAGST H&V  05/09/2024  9:15 AM Cassandra Cleveland, MD GNA-[facility]NA None    No orders of the defined types were placed in this encounter.     -------------------------------

## 2023-12-15 ENCOUNTER — Observation Stay (HOSPITAL_COMMUNITY)

## 2023-12-15 ENCOUNTER — Encounter (HOSPITAL_COMMUNITY): Payer: Self-pay

## 2023-12-15 ENCOUNTER — Emergency Department (HOSPITAL_COMMUNITY)

## 2023-12-15 ENCOUNTER — Inpatient Hospital Stay (HOSPITAL_COMMUNITY)
Admission: EM | Admit: 2023-12-15 | Discharge: 2023-12-20 | DRG: 689 | Disposition: A | Discharge status: Skilled Nursing Facility | Source: Skilled Nursing Facility | Attending: Internal Medicine | Admitting: Internal Medicine

## 2023-12-15 ENCOUNTER — Other Ambulatory Visit: Payer: Self-pay

## 2023-12-15 DIAGNOSIS — Z8673 Personal history of transient ischemic attack (TIA), and cerebral infarction without residual deficits: Secondary | ICD-10-CM

## 2023-12-15 DIAGNOSIS — Z952 Presence of prosthetic heart valve: Secondary | ICD-10-CM

## 2023-12-15 DIAGNOSIS — N390 Urinary tract infection, site not specified: Secondary | ICD-10-CM | POA: Diagnosis not present

## 2023-12-15 DIAGNOSIS — M4802 Spinal stenosis, cervical region: Secondary | ICD-10-CM | POA: Diagnosis present

## 2023-12-15 DIAGNOSIS — N400 Enlarged prostate without lower urinary tract symptoms: Secondary | ICD-10-CM | POA: Diagnosis present

## 2023-12-15 DIAGNOSIS — M6281 Muscle weakness (generalized): Secondary | ICD-10-CM | POA: Diagnosis not present

## 2023-12-15 DIAGNOSIS — J4489 Other specified chronic obstructive pulmonary disease: Secondary | ICD-10-CM | POA: Diagnosis not present

## 2023-12-15 DIAGNOSIS — I130 Hypertensive heart and chronic kidney disease with heart failure and stage 1 through stage 4 chronic kidney disease, or unspecified chronic kidney disease: Secondary | ICD-10-CM | POA: Diagnosis not present

## 2023-12-15 DIAGNOSIS — Z7985 Long-term (current) use of injectable non-insulin antidiabetic drugs: Secondary | ICD-10-CM

## 2023-12-15 DIAGNOSIS — Z66 Do not resuscitate: Secondary | ICD-10-CM | POA: Diagnosis not present

## 2023-12-15 DIAGNOSIS — R4781 Slurred speech: Secondary | ICD-10-CM | POA: Diagnosis not present

## 2023-12-15 DIAGNOSIS — E7800 Pure hypercholesterolemia, unspecified: Secondary | ICD-10-CM | POA: Diagnosis not present

## 2023-12-15 DIAGNOSIS — I878 Other specified disorders of veins: Secondary | ICD-10-CM | POA: Diagnosis present

## 2023-12-15 DIAGNOSIS — I5032 Chronic diastolic (congestive) heart failure: Secondary | ICD-10-CM | POA: Diagnosis present

## 2023-12-15 DIAGNOSIS — R279 Unspecified lack of coordination: Secondary | ICD-10-CM | POA: Diagnosis not present

## 2023-12-15 DIAGNOSIS — G459 Transient cerebral ischemic attack, unspecified: Secondary | ICD-10-CM | POA: Diagnosis not present

## 2023-12-15 DIAGNOSIS — R29810 Facial weakness: Secondary | ICD-10-CM | POA: Diagnosis not present

## 2023-12-15 DIAGNOSIS — J449 Chronic obstructive pulmonary disease, unspecified: Secondary | ICD-10-CM | POA: Diagnosis not present

## 2023-12-15 DIAGNOSIS — E119 Type 2 diabetes mellitus without complications: Secondary | ICD-10-CM

## 2023-12-15 DIAGNOSIS — R059 Cough, unspecified: Secondary | ICD-10-CM | POA: Diagnosis not present

## 2023-12-15 DIAGNOSIS — Z8249 Family history of ischemic heart disease and other diseases of the circulatory system: Secondary | ICD-10-CM

## 2023-12-15 DIAGNOSIS — N3000 Acute cystitis without hematuria: Secondary | ICD-10-CM

## 2023-12-15 DIAGNOSIS — B961 Klebsiella pneumoniae [K. pneumoniae] as the cause of diseases classified elsewhere: Secondary | ICD-10-CM | POA: Diagnosis present

## 2023-12-15 DIAGNOSIS — Z1611 Resistance to penicillins: Secondary | ICD-10-CM | POA: Diagnosis present

## 2023-12-15 DIAGNOSIS — Z953 Presence of xenogenic heart valve: Secondary | ICD-10-CM | POA: Diagnosis not present

## 2023-12-15 DIAGNOSIS — R531 Weakness: Principal | ICD-10-CM

## 2023-12-15 DIAGNOSIS — F419 Anxiety disorder, unspecified: Secondary | ICD-10-CM | POA: Diagnosis present

## 2023-12-15 DIAGNOSIS — Z87891 Personal history of nicotine dependence: Secondary | ICD-10-CM

## 2023-12-15 DIAGNOSIS — E1122 Type 2 diabetes mellitus with diabetic chronic kidney disease: Secondary | ICD-10-CM | POA: Diagnosis present

## 2023-12-15 DIAGNOSIS — Z8616 Personal history of COVID-19: Secondary | ICD-10-CM

## 2023-12-15 DIAGNOSIS — E66813 Obesity, class 3: Secondary | ICD-10-CM | POA: Diagnosis present

## 2023-12-15 DIAGNOSIS — G40909 Epilepsy, unspecified, not intractable, without status epilepticus: Secondary | ICD-10-CM

## 2023-12-15 DIAGNOSIS — E1140 Type 2 diabetes mellitus with diabetic neuropathy, unspecified: Secondary | ICD-10-CM | POA: Diagnosis present

## 2023-12-15 DIAGNOSIS — R471 Dysarthria and anarthria: Secondary | ICD-10-CM | POA: Diagnosis present

## 2023-12-15 DIAGNOSIS — G4733 Obstructive sleep apnea (adult) (pediatric): Secondary | ICD-10-CM | POA: Diagnosis not present

## 2023-12-15 DIAGNOSIS — F32A Depression, unspecified: Secondary | ICD-10-CM | POA: Diagnosis present

## 2023-12-15 DIAGNOSIS — I2583 Coronary atherosclerosis due to lipid rich plaque: Secondary | ICD-10-CM | POA: Diagnosis present

## 2023-12-15 DIAGNOSIS — I480 Paroxysmal atrial fibrillation: Secondary | ICD-10-CM | POA: Diagnosis present

## 2023-12-15 DIAGNOSIS — Z7901 Long term (current) use of anticoagulants: Secondary | ICD-10-CM | POA: Diagnosis not present

## 2023-12-15 DIAGNOSIS — I69354 Hemiplegia and hemiparesis following cerebral infarction affecting left non-dominant side: Secondary | ICD-10-CM

## 2023-12-15 DIAGNOSIS — Z833 Family history of diabetes mellitus: Secondary | ICD-10-CM

## 2023-12-15 DIAGNOSIS — Z881 Allergy status to other antibiotic agents status: Secondary | ICD-10-CM

## 2023-12-15 DIAGNOSIS — Z794 Long term (current) use of insulin: Secondary | ICD-10-CM

## 2023-12-15 DIAGNOSIS — G9389 Other specified disorders of brain: Secondary | ICD-10-CM | POA: Diagnosis not present

## 2023-12-15 DIAGNOSIS — R400 Somnolence: Secondary | ICD-10-CM

## 2023-12-15 DIAGNOSIS — I4891 Unspecified atrial fibrillation: Secondary | ICD-10-CM

## 2023-12-15 DIAGNOSIS — I10 Essential (primary) hypertension: Secondary | ICD-10-CM | POA: Diagnosis present

## 2023-12-15 DIAGNOSIS — Z96652 Presence of left artificial knee joint: Secondary | ICD-10-CM | POA: Diagnosis present

## 2023-12-15 DIAGNOSIS — G928 Other toxic encephalopathy: Secondary | ICD-10-CM | POA: Diagnosis not present

## 2023-12-15 DIAGNOSIS — Z823 Family history of stroke: Secondary | ICD-10-CM

## 2023-12-15 DIAGNOSIS — I2510 Atherosclerotic heart disease of native coronary artery without angina pectoris: Secondary | ICD-10-CM | POA: Diagnosis present

## 2023-12-15 DIAGNOSIS — E1169 Type 2 diabetes mellitus with other specified complication: Secondary | ICD-10-CM | POA: Diagnosis not present

## 2023-12-15 DIAGNOSIS — N1831 Chronic kidney disease, stage 3a: Secondary | ICD-10-CM | POA: Diagnosis present

## 2023-12-15 DIAGNOSIS — R29818 Other symptoms and signs involving the nervous system: Secondary | ICD-10-CM | POA: Diagnosis not present

## 2023-12-15 DIAGNOSIS — G629 Polyneuropathy, unspecified: Secondary | ICD-10-CM

## 2023-12-15 DIAGNOSIS — R079 Chest pain, unspecified: Secondary | ICD-10-CM | POA: Diagnosis not present

## 2023-12-15 DIAGNOSIS — F332 Major depressive disorder, recurrent severe without psychotic features: Secondary | ICD-10-CM | POA: Diagnosis present

## 2023-12-15 DIAGNOSIS — Z860101 Personal history of adenomatous and serrated colon polyps: Secondary | ICD-10-CM

## 2023-12-15 DIAGNOSIS — R5381 Other malaise: Secondary | ICD-10-CM | POA: Diagnosis present

## 2023-12-15 DIAGNOSIS — R2990 Unspecified symptoms and signs involving the nervous system: Secondary | ICD-10-CM | POA: Diagnosis present

## 2023-12-15 DIAGNOSIS — E669 Obesity, unspecified: Secondary | ICD-10-CM | POA: Diagnosis present

## 2023-12-15 DIAGNOSIS — F4310 Post-traumatic stress disorder, unspecified: Secondary | ICD-10-CM | POA: Diagnosis present

## 2023-12-15 DIAGNOSIS — I517 Cardiomegaly: Secondary | ICD-10-CM | POA: Diagnosis not present

## 2023-12-15 DIAGNOSIS — Z79899 Other long term (current) drug therapy: Secondary | ICD-10-CM

## 2023-12-15 DIAGNOSIS — R0789 Other chest pain: Secondary | ICD-10-CM | POA: Diagnosis not present

## 2023-12-15 LAB — DIFFERENTIAL
Abs Immature Granulocytes: 0.04 10*3/uL (ref 0.00–0.07)
Basophils Absolute: 0.1 10*3/uL (ref 0.0–0.1)
Basophils Relative: 0 %
Eosinophils Absolute: 0.2 10*3/uL (ref 0.0–0.5)
Eosinophils Relative: 2 %
Immature Granulocytes: 0 %
Lymphocytes Relative: 8 %
Lymphs Abs: 1.1 10*3/uL (ref 0.7–4.0)
Monocytes Absolute: 1.3 10*3/uL — ABNORMAL HIGH (ref 0.1–1.0)
Monocytes Relative: 9 %
Neutro Abs: 11 10*3/uL — ABNORMAL HIGH (ref 1.7–7.7)
Neutrophils Relative %: 81 %

## 2023-12-15 LAB — URINALYSIS, COMPLETE (UACMP) WITH MICROSCOPIC
Bilirubin Urine: NEGATIVE
Glucose, UA: 50 mg/dL — AB
Hgb urine dipstick: NEGATIVE
Ketones, ur: NEGATIVE mg/dL
Nitrite: NEGATIVE
Protein, ur: 30 mg/dL — AB
Specific Gravity, Urine: 1.017 (ref 1.005–1.030)
WBC, UA: >50 WBC/hpf (ref 0–5)
pH: 6 (ref 5.0–8.0)

## 2023-12-15 LAB — COMPREHENSIVE METABOLIC PANEL WITH GFR
ALT: 22 U/L (ref 0–44)
AST: 16 U/L (ref 15–41)
Albumin: 3.4 g/dL — ABNORMAL LOW (ref 3.5–5.0)
Alkaline Phosphatase: 56 U/L (ref 38–126)
Anion gap: 12 (ref 5–15)
BUN: 21 mg/dL (ref 8–23)
CO2: 26 mmol/L (ref 22–32)
Calcium: 8.9 mg/dL (ref 8.9–10.3)
Chloride: 95 mmol/L — ABNORMAL LOW (ref 98–111)
Creatinine, Ser: 1.26 mg/dL — ABNORMAL HIGH (ref 0.61–1.24)
GFR, Estimated: >60 mL/min (ref >60)
Glucose, Bld: 249 mg/dL — ABNORMAL HIGH (ref 70–99)
Potassium: 3.8 mmol/L (ref 3.5–5.1)
Sodium: 133 mmol/L — ABNORMAL LOW (ref 135–145)
Total Bilirubin: 0.9 mg/dL (ref 0.0–1.2)
Total Protein: 6.7 g/dL (ref 6.5–8.1)

## 2023-12-15 LAB — CBC
HCT: 40.7 % (ref 39.0–52.0)
Hemoglobin: 13.9 g/dL (ref 13.0–17.0)
MCH: 30.3 pg (ref 26.0–34.0)
MCHC: 34.2 g/dL (ref 30.0–36.0)
MCV: 88.7 fL (ref 80.0–100.0)
Platelets: 207 10*3/uL (ref 150–400)
RBC: 4.59 MIL/uL (ref 4.22–5.81)
RDW: 13.2 % (ref 11.5–15.5)
WBC: 13.6 10*3/uL — ABNORMAL HIGH (ref 4.0–10.5)
nRBC: 0 % (ref 0.0–0.2)

## 2023-12-15 LAB — POCT I-STAT, CHEM 8
BUN: 24 mg/dL — ABNORMAL HIGH (ref 8–23)
Calcium, Ion: 1.07 mmol/L — ABNORMAL LOW (ref 1.15–1.40)
Chloride: 94 mmol/L — ABNORMAL LOW (ref 98–111)
Creatinine, Ser: 1.2 mg/dL (ref 0.61–1.24)
Glucose, Bld: 242 mg/dL — ABNORMAL HIGH (ref 70–99)
HCT: 43 % (ref 39.0–52.0)
Hemoglobin: 14.6 g/dL (ref 13.0–17.0)
Potassium: 3.7 mmol/L (ref 3.5–5.1)
Sodium: 133 mmol/L — ABNORMAL LOW (ref 135–145)
TCO2: 26 mmol/L (ref 22–32)

## 2023-12-15 LAB — GLUCOSE, CAPILLARY: Glucose-Capillary: 212 mg/dL — ABNORMAL HIGH (ref 70–99)

## 2023-12-15 LAB — ETHANOL: Alcohol, Ethyl (B): <15 mg/dL (ref <15)

## 2023-12-15 LAB — APTT: aPTT: 28 s (ref 24–36)

## 2023-12-15 LAB — PROTIME-INR
INR: 1.2 (ref 0.8–1.2)
Prothrombin Time: 15.2 s (ref 11.4–15.2)

## 2023-12-15 LAB — CBG MONITORING, ED: Glucose-Capillary: 257 mg/dL — ABNORMAL HIGH (ref 70–99)

## 2023-12-15 MED ORDER — PANTOPRAZOLE SODIUM 40 MG PO TBEC
40.0000 mg | DELAYED_RELEASE_TABLET | Freq: Every day | ORAL | Status: DC
  Administered 2023-12-16 – 2023-12-20 (×5): 40 mg via ORAL
  Filled 2023-12-15 (×5): qty 1

## 2023-12-15 MED ORDER — SORBITOL 70 % SOLN: 30.0000 mL | Freq: Every day | Status: DC | PRN

## 2023-12-15 MED ORDER — LACOSAMIDE 150 MG PO TABS: 150.0000 mg | ORAL_TABLET | Freq: Two times a day (BID) | ORAL | Status: DC

## 2023-12-15 MED ORDER — SENNOSIDES-DOCUSATE SODIUM 8.6-50 MG PO TABS
2.0000 | ORAL_TABLET | Freq: Two times a day (BID) | ORAL | Status: DC
Start: 2023-12-15 — End: 2023-12-20
  Administered 2023-12-15 – 2023-12-17 (×3): 2 via ORAL
  Filled 2023-12-15 (×9): qty 2

## 2023-12-15 MED ORDER — POTASSIUM CHLORIDE CRYS ER 20 MEQ PO TBCR
20.0000 meq | EXTENDED_RELEASE_TABLET | Freq: Every evening | ORAL | Status: DC
  Administered 2023-12-15 – 2023-12-19 (×5): 20 meq via ORAL
  Filled 2023-12-15 (×5): qty 1

## 2023-12-15 MED ORDER — ATORVASTATIN CALCIUM 40 MG PO TABS
40.0000 mg | ORAL_TABLET | Freq: Every day | ORAL | Status: DC
  Administered 2023-12-15: 40 mg via ORAL
  Filled 2023-12-15: qty 1

## 2023-12-15 MED ORDER — MIDODRINE HCL 5 MG PO TABS
5.0000 mg | ORAL_TABLET | Freq: Three times a day (TID) | ORAL | Status: DC
  Administered 2023-12-16 – 2023-12-20 (×12): 5 mg via ORAL
  Filled 2023-12-15 (×12): qty 1

## 2023-12-15 MED ORDER — UMECLIDINIUM BROMIDE 62.5 MCG/ACT IN AEPB
1.0000 | INHALATION_SPRAY | Freq: Every day | RESPIRATORY_TRACT | Status: DC
  Administered 2023-12-16 – 2023-12-20 (×4): 1 via RESPIRATORY_TRACT
  Filled 2023-12-15: qty 7

## 2023-12-15 MED ORDER — ONDANSETRON HCL 4 MG/2ML IJ SOLN: 4.0000 mg | Freq: Four times a day (QID) | INTRAMUSCULAR | Status: DC | PRN

## 2023-12-15 MED ORDER — TORSEMIDE 20 MG PO TABS
20.0000 mg | ORAL_TABLET | Freq: Two times a day (BID) | ORAL | Status: DC
  Administered 2023-12-15 – 2023-12-20 (×10): 20 mg via ORAL
  Filled 2023-12-15 (×10): qty 1

## 2023-12-15 MED ORDER — POLYETHYLENE GLYCOL 3350 17 G PO PACK
17.0000 g | PACK | Freq: Every day | ORAL | Status: DC
  Administered 2023-12-15 – 2023-12-17 (×2): 17 g via ORAL
  Filled 2023-12-15 (×6): qty 1

## 2023-12-15 MED ORDER — METOLAZONE 2.5 MG PO TABS
2.5000 mg | ORAL_TABLET | ORAL | Status: DC
  Administered 2023-12-19: 2.5 mg via ORAL
  Filled 2023-12-15: qty 1

## 2023-12-15 MED ORDER — FLUTICASONE FUROATE-VILANTEROL 200-25 MCG/ACT IN AEPB
1.0000 | INHALATION_SPRAY | Freq: Every day | RESPIRATORY_TRACT | Status: DC
  Administered 2023-12-16 – 2023-12-20 (×4): 1 via RESPIRATORY_TRACT
  Filled 2023-12-15: qty 28

## 2023-12-15 MED ORDER — MIRTAZAPINE 15 MG PO TABS
15.0000 mg | ORAL_TABLET | Freq: Every day | ORAL | Status: DC
  Administered 2023-12-15: 15 mg via ORAL
  Filled 2023-12-15: qty 1

## 2023-12-15 MED ORDER — FENOFIBRATE 54 MG PO TABS
54.0000 mg | ORAL_TABLET | Freq: Every day | ORAL | Status: DC
  Administered 2023-12-16 – 2023-12-20 (×5): 54 mg via ORAL
  Filled 2023-12-15 (×5): qty 1

## 2023-12-15 MED ORDER — POTASSIUM CHLORIDE CRYS ER 20 MEQ PO TBCR: 20.0000 meq | EXTENDED_RELEASE_TABLET | ORAL | Status: DC

## 2023-12-15 MED ORDER — POLYETHYLENE GLYCOL 3350 17 G PO PACK: 17.0000 g | PACK | Freq: Every day | ORAL | Status: DC | PRN

## 2023-12-15 MED ORDER — SPIRONOLACTONE 12.5 MG HALF TABLET
12.5000 mg | ORAL_TABLET | Freq: Every day | ORAL | Status: DC
  Administered 2023-12-16 – 2023-12-20 (×5): 12.5 mg via ORAL
  Filled 2023-12-15 (×5): qty 1

## 2023-12-15 MED ORDER — ENOXAPARIN SODIUM 80 MG/0.8ML IJ SOSY
80.0000 mg | PREFILLED_SYRINGE | INTRAMUSCULAR | Status: DC
  Filled 2023-12-15: qty 0.8

## 2023-12-15 MED ORDER — BUSPIRONE HCL 10 MG PO TABS
10.0000 mg | ORAL_TABLET | Freq: Three times a day (TID) | ORAL | Status: DC
  Administered 2023-12-15 – 2023-12-16 (×2): 10 mg via ORAL
  Filled 2023-12-15 (×2): qty 1

## 2023-12-15 MED ORDER — METOPROLOL SUCCINATE ER 25 MG PO TB24: 50.0000 mg | ORAL_TABLET | Freq: Every day | ORAL | Status: DC

## 2023-12-15 MED ORDER — VENLAFAXINE HCL ER 75 MG PO CP24
150.0000 mg | ORAL_CAPSULE | Freq: Every day | ORAL | Status: DC
  Administered 2023-12-16 – 2023-12-20 (×5): 150 mg via ORAL
  Filled 2023-12-15 (×5): qty 2

## 2023-12-15 MED ORDER — TRAZODONE HCL 100 MG PO TABS
200.0000 mg | ORAL_TABLET | Freq: Every day | ORAL | Status: DC
  Administered 2023-12-15: 200 mg via ORAL
  Filled 2023-12-15: qty 2

## 2023-12-15 MED ORDER — FLUTICASONE PROPIONATE 50 MCG/ACT NA SUSP
2.0000 | Freq: Every day | NASAL | Status: DC
Start: 2023-12-16 — End: 2023-12-20
  Administered 2023-12-16 – 2023-12-20 (×5): 2 via NASAL
  Filled 2023-12-15: qty 16

## 2023-12-15 MED ORDER — APIXABAN 5 MG PO TABS
5.0000 mg | ORAL_TABLET | Freq: Two times a day (BID) | ORAL | Status: DC
  Administered 2023-12-15 – 2023-12-20 (×10): 5 mg via ORAL
  Filled 2023-12-15 (×10): qty 1

## 2023-12-15 MED ORDER — VITAMIN B-12 1000 MCG PO TABS
1000.0000 ug | ORAL_TABLET | Freq: Every day | ORAL | Status: DC
  Administered 2023-12-16 – 2023-12-20 (×5): 1000 ug via ORAL
  Filled 2023-12-15 (×5): qty 1

## 2023-12-15 MED ORDER — POTASSIUM CHLORIDE CRYS ER 20 MEQ PO TBCR
40.0000 meq | EXTENDED_RELEASE_TABLET | Freq: Every morning | ORAL | Status: DC
  Administered 2023-12-16 – 2023-12-20 (×5): 40 meq via ORAL
  Filled 2023-12-15 (×5): qty 2

## 2023-12-15 MED ORDER — HYDRALAZINE HCL 20 MG/ML IJ SOLN: 10.0000 mg | Freq: Four times a day (QID) | INTRAMUSCULAR | Status: DC | PRN

## 2023-12-15 MED ORDER — ARIPIPRAZOLE 5 MG PO TABS
5.0000 mg | ORAL_TABLET | Freq: Every day | ORAL | Status: DC
  Administered 2023-12-16 – 2023-12-20 (×5): 5 mg via ORAL
  Filled 2023-12-15 (×5): qty 1

## 2023-12-15 MED ORDER — SODIUM CHLORIDE 0.9% FLUSH: 3.0000 mL | Freq: Once | INTRAVENOUS | Status: DC

## 2023-12-15 MED ORDER — PREGABALIN 75 MG PO CAPS
150.0000 mg | ORAL_CAPSULE | Freq: Three times a day (TID) | ORAL | Status: DC
Start: 2023-12-15 — End: 2023-12-20
  Administered 2023-12-15 – 2023-12-20 (×14): 150 mg via ORAL
  Filled 2023-12-15 (×14): qty 2

## 2023-12-15 MED ORDER — FERROUS SULFATE 325 (65 FE) MG PO TABS
325.0000 mg | ORAL_TABLET | Freq: Every day | ORAL | Status: DC
  Administered 2023-12-16 – 2023-12-20 (×5): 325 mg via ORAL
  Filled 2023-12-15 (×5): qty 1

## 2023-12-15 MED ORDER — LACOSAMIDE 50 MG PO TABS
150.0000 mg | ORAL_TABLET | Freq: Two times a day (BID) | ORAL | Status: DC
  Administered 2023-12-15 – 2023-12-20 (×10): 150 mg via ORAL
  Filled 2023-12-15 (×10): qty 3

## 2023-12-15 MED ORDER — LEVETIRACETAM 500 MG PO TABS
500.0000 mg | ORAL_TABLET | Freq: Two times a day (BID) | ORAL | Status: DC
  Administered 2023-12-15 – 2023-12-20 (×10): 500 mg via ORAL
  Filled 2023-12-15 (×10): qty 1

## 2023-12-15 MED ORDER — OMEGA-3-ACID ETHYL ESTERS 1 G PO CAPS
2.0000 g | ORAL_CAPSULE | Freq: Two times a day (BID) | ORAL | Status: DC
  Administered 2023-12-15 – 2023-12-20 (×10): 2 g via ORAL
  Filled 2023-12-15 (×11): qty 2

## 2023-12-15 MED ORDER — LINEZOLID 600 MG PO TABS: 600.0000 mg | ORAL_TABLET | Freq: Two times a day (BID) | ORAL | Status: DC

## 2023-12-15 MED ORDER — SODIUM CHLORIDE 0.9 % IV SOLN: INTRAVENOUS | Status: AC

## 2023-12-15 MED ORDER — INSULIN ASPART 100 UNIT/ML IJ SOLN
0.0000 [IU] | Freq: Three times a day (TID) | INTRAMUSCULAR | Status: DC
  Administered 2023-12-16 (×3): 2 [IU] via SUBCUTANEOUS
  Administered 2023-12-17 (×3): 1 [IU] via SUBCUTANEOUS
  Administered 2023-12-18: 2 [IU] via SUBCUTANEOUS
  Administered 2023-12-18: 1 [IU] via SUBCUTANEOUS
  Administered 2023-12-18 – 2023-12-19 (×2): 2 [IU] via SUBCUTANEOUS
  Administered 2023-12-19 (×2): 1 [IU] via SUBCUTANEOUS
  Administered 2023-12-20: 3 [IU] via SUBCUTANEOUS
  Administered 2023-12-20: 2 [IU] via SUBCUTANEOUS

## 2023-12-15 MED ORDER — ACETAMINOPHEN 325 MG PO TABS
650.0000 mg | ORAL_TABLET | Freq: Four times a day (QID) | ORAL | Status: DC | PRN
  Administered 2023-12-16: 325 mg via ORAL
  Administered 2023-12-17 – 2023-12-19 (×3): 650 mg via ORAL
  Filled 2023-12-15 (×4): qty 2

## 2023-12-15 MED ORDER — ROPINIROLE HCL 0.25 MG PO TABS
0.2500 mg | ORAL_TABLET | Freq: Two times a day (BID) | ORAL | Status: DC
  Administered 2023-12-15 – 2023-12-20 (×10): 0.25 mg via ORAL
  Filled 2023-12-15 (×11): qty 1

## 2023-12-15 MED ORDER — PRAZOSIN HCL 2 MG PO CAPS
2.0000 mg | ORAL_CAPSULE | Freq: Every day | ORAL | Status: DC
  Administered 2023-12-15 – 2023-12-19 (×5): 2 mg via ORAL
  Filled 2023-12-15 (×6): qty 1

## 2023-12-15 MED ORDER — ONDANSETRON HCL 4 MG PO TABS: 4.0000 mg | ORAL_TABLET | Freq: Four times a day (QID) | ORAL | Status: DC | PRN

## 2023-12-15 MED ORDER — HYDROCODONE-ACETAMINOPHEN 5-325 MG PO TABS
1.0000 | ORAL_TABLET | Freq: Four times a day (QID) | ORAL | Status: DC | PRN
  Administered 2023-12-16 – 2023-12-20 (×9): 1 via ORAL
  Filled 2023-12-15 (×11): qty 1

## 2023-12-15 MED ORDER — ALLOPURINOL 100 MG PO TABS
100.0000 mg | ORAL_TABLET | Freq: Every day | ORAL | Status: DC
  Administered 2023-12-16 – 2023-12-20 (×5): 100 mg via ORAL
  Filled 2023-12-15 (×5): qty 1

## 2023-12-15 MED ORDER — METHOCARBAMOL 500 MG PO TABS
500.0000 mg | ORAL_TABLET | Freq: Three times a day (TID) | ORAL | Status: DC | PRN
  Administered 2023-12-15 – 2023-12-19 (×8): 500 mg via ORAL
  Filled 2023-12-15 (×8): qty 1

## 2023-12-15 MED ORDER — SODIUM CHLORIDE 0.9% FLUSH
3.0000 mL | Freq: Two times a day (BID) | INTRAVENOUS | Status: DC
  Administered 2023-12-15 – 2023-12-20 (×8): 3 mL via INTRAVENOUS

## 2023-12-15 MED ORDER — ACETAMINOPHEN 650 MG RE SUPP: 650.0000 mg | Freq: Four times a day (QID) | RECTAL | Status: DC | PRN

## 2023-12-15 MED ORDER — COLCHICINE 0.6 MG PO TABS
0.6000 mg | ORAL_TABLET | Freq: Every day | ORAL | Status: DC
  Administered 2023-12-16 – 2023-12-20 (×5): 0.6 mg via ORAL
  Filled 2023-12-15 (×5): qty 1

## 2023-12-15 NOTE — ED Triage Notes (Signed)
 Pt to ED via [facility] from [facility], code stroke. Family noticed that pt had slurred speech. Pt's LKW 0800 this am. Per facility, pt is usually A&Ox4, uses a wheelchair d/t leg weakness but is able to lift weights with bilateral arms. EMS reported slurred speech, left factial droop, irregular pupils, Left pinpoint, Right 3mm. Pt was able to answer questions appropriately but slow to respond and has bilateral arm drift. ED MD and neurology at bedside on arrival.   EMS VS 140/80 90 98% RA Cbg 245

## 2023-12-15 NOTE — Progress Notes (Signed)
 SLP Cancellation Note  Patient Details Name: Lawrence [name] MRN: [id number] DOB: 06-Sep-1955   Cancelled treatment:       Reason Eval/Treat Not Completed: Pt passed the swallow screen and is currently on a regular diet. Discussed with RN, who has no concerns. SLP will sign off but please re-consult if needed.    Amil Kale, M.A., CCC-SLP Speech Language Pathology, Acute Rehabilitation Services  Secure Chat preferred [phone]  12/15/2023, 4:04 PM

## 2023-12-15 NOTE — Consult Note (Signed)
 NEUROLOGY CONSULT NOTE   Date of service: Dec 15, 2023 Patient Name: Lawrence [name] MRN:  [id number] DOB:  1956/05/19 Chief Complaint: "generalized weakness" Requesting Provider: Lowery Rue, DO  History of Present Illness  Lawrence [name] is a 68 y.o. male  has a past medical history of Acquired dilation of ascending aorta and aortic root (HCC), Adenomatous colon polyp (2007), Anemia, Anxiety, Asthma, BPH without obstruction/lower urinary tract symptoms (02/22/2017), Chronic diastolic (congestive) heart failure (HCC), Chronic venous stasis (03/07/2019), COPD (chronic obstructive pulmonary disease) (HCC), Coronary artery calcification seen on CAT scan, Depression, Diabetic neuropathy (HCC) (09/11/2019), History of colon polyps (08/24/2018), Hypertension, Morbid obesity (HCC), OSA (obstructive sleep apnea), Pain due to onychomycosis of toenails of both feet (09/11/2019), Peripheral neuropathy (02/22/2017), Primary osteoarthritis, left shoulder (03/05/2017), PTSD (post-traumatic stress disorder), Pure hypercholesterolemia, QT prolongation (03/07/2019), S/P TAVR (transcatheter aortic valve replacement) (02/07/2023), Seizures (HCC), Severe aortic stenosis, Sinus tachycardia (03/07/2019), Sleep apnea, and Type 2 diabetes mellitus with vascular disease (HCC) (09/11/2019). Stroke with residual left sided weakness who presents from his facility with worsening of his baseline L sided weakness. He is brought in from his SNF with concern for worsening of his baseline weakness and confusion. He is on eliquis  and got his meds this AM.  He reports numbness on the left, lethargic but oriented to place, month and year.  LKW: 0800 Modified rankin score: 4-Needs assistance to walk and tend to bodily needs IV Thrombolysis: not offered, patient on eliquis  EVT: not offered, no LVO and low suspicion for stroke.  NIHSS components Score: Comment  1a Level of Conscious 0[]  1[x]  2[]  3[]      1b LOC Questions 0[x]  1[]   2[]       1c LOC Commands 0[x]  1[]  2[]       2 Best Gaze 0[x]  1[]  2[]       3 Visual 0[x]  1[]  2[]  3[]      4 Facial Palsy 0[]  1[x]  2[]  3[]      5a Motor Arm - left 0[]  1[]  2[x]  3[]  4[]  UN[]    5b Motor Arm - Right 0[]  1[x]  2[]  3[]  4[]  UN[]    6a Motor Leg - Left 0[]  1[]  2[x]  3[]  4[]  UN[]    6b Motor Leg - Right 0[]  1[]  2[x]  3[]  4[]  UN[]    7 Limb Ataxia 0[x]  1[]  2[]  UN[]      8 Sensory 0[]  1[x]  2[]  UN[]      9 Best Language 0[]  1[x]  2[]  3[]      10 Dysarthria 0[]  1[x]  2[]  UN[]      11 Extinct. and Inattention 0[x]  1[]  2[]       TOTAL: 13      ROS  Unable to ascertain due to drowsiness.  Past History   Past Medical History:  Diagnosis Date   Acquired dilation of ascending aorta and aortic root (HCC)    40mm by echo 01/2021   Adenomatous colon polyp 2007   Anemia    Anxiety    Asthma    BPH without obstruction/lower urinary tract symptoms 02/22/2017   Chronic diastolic (congestive) heart failure (HCC)    Chronic venous stasis 03/07/2019   COPD (chronic obstructive pulmonary disease) (HCC)    Coronary artery calcification seen on CAT scan    Depression    Diabetic neuropathy (HCC) 09/11/2019   History of colon polyps 08/24/2018   Hypertension    Morbid obesity (HCC)    OSA (obstructive sleep apnea)    Pain due to onychomycosis of toenails of both feet 09/11/2019   Peripheral neuropathy 02/22/2017  Primary osteoarthritis, left shoulder 03/05/2017   PTSD (post-traumatic stress disorder)    Pure hypercholesterolemia    QT prolongation 03/07/2019   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 Lorie Rook and Dr. Sherene Dilling   Seizures [facility])    Severe aortic stenosis    Sinus tachycardia 03/07/2019   Sleep apnea    CPAP   Type 2 diabetes mellitus with vascular disease (HCC) 09/11/2019    Past Surgical History:  Procedure Laterality Date   ENDOVENOUS ABLATION SAPHENOUS VEIN W/ LASER Right 08/20/2020   endovenous laser ablation  right greater saphenous vein by Kirtland Perfect MD    ENDOVENOUS ABLATION SAPHENOUS VEIN W/ LASER Left 11/16/2022   endovenous laser ablation left greater saphenous vein by Kirtland Perfect MD   INTRAOPERATIVE TRANSTHORACIC ECHOCARDIOGRAM N/A 02/07/2023   Procedure: INTRAOPERATIVE TRANSTHORACIC ECHOCARDIOGRAM;  Surgeon: Kyra Phy, MD;  Location: [facility] INVASIVE CV LAB;  Service: Open Heart Surgery;  Laterality: N/A;   JOINT REPLACEMENT     left knee replacement x 2   KNEE ARTHROSCOPY Bilateral    LEFT HEART CATH AND CORONARY ANGIOGRAPHY N/A 01/17/2018   Procedure: LEFT HEART CATH AND CORONARY ANGIOGRAPHY;  Surgeon: Arleen Lacer, MD;  Location: [facility] INVASIVE CV LAB;  Service: Cardiovascular;  Laterality: N/A;   PRESSURE SENSOR/CARDIOMEMS N/A 08/26/2021   Procedure: PRESSURE SENSOR/CARDIOMEMS;  Surgeon: Darlis Eisenmenger, MD;  Location: [facility] INVASIVE CV LAB;  Service: Cardiovascular;  Laterality: N/A;   RIGHT HEART CATH N/A 08/26/2021   Procedure: RIGHT HEART CATH;  Surgeon: Darlis Eisenmenger, MD;  Location: [facility] INVASIVE CV LAB;  Service: Cardiovascular;  Laterality: N/A;   RIGHT HEART CATH AND CORONARY ANGIOGRAPHY N/A 01/26/2023   Procedure: RIGHT HEART CATH AND CORONARY ANGIOGRAPHY;  Surgeon: Kyra Phy, MD;  Location: [facility] INVASIVE CV LAB;  Service: Cardiovascular;  Laterality: N/A;   TEE WITHOUT CARDIOVERSION N/A 08/26/2021   Procedure: TRANSESOPHAGEAL ECHOCARDIOGRAM (TEE);  Surgeon: Darlis Eisenmenger, MD;  Location: [facility] ENDOSCOPY;  Service: Cardiovascular;  Laterality: N/A;   TOOTH EXTRACTION N/A 02/03/2023   Procedure: DENTAL RESTORATION/EXTRACTIONS;  Surgeon: Ascencion Lava, DMD;  Location: [facility] OR;  Service: Oral Surgery;  Laterality: N/A;   TRANSCATHETER AORTIC VALVE REPLACEMENT, TRANSFEMORAL Right 02/07/2023   Procedure: Transcatheter Aortic Valve Replacement, Transfemoral;  Surgeon: Kyra Phy, MD;  Location: [facility] INVASIVE CV LAB;  Service: Open Heart Surgery;  Laterality: Right;    TRANSESOPHAGEAL ECHOCARDIOGRAM (CATH LAB) N/A 12/07/2023   Procedure: TRANSESOPHAGEAL ECHOCARDIOGRAM;  Surgeon: Jann Melody, MD;  Location: [facility] INVASIVE CV LAB;  Service: Cardiovascular;  Laterality: N/A;   UMBILICAL HERNIA REPAIR      Family History: Family History  Problem Relation Age of Onset   CAD Maternal Grandfather    Diabetes Other    Diabetes Mellitus II Neg Hx    Colon cancer Neg Hx    Esophageal cancer Neg Hx    Inflammatory bowel disease Neg Hx    Liver disease Neg Hx    Pancreatic cancer Neg Hx    Rectal cancer Neg Hx    Stomach cancer Neg Hx    Sleep apnea Neg Hx     Social History  reports that he quit smoking about 7 years ago. His smoking use included cigarettes. He started smoking about 51 years ago. He has a 44 pack-year smoking history. He has been exposed to tobacco smoke. He has never used smokeless tobacco. He reports current alcohol  use. He reports that  he does not use drugs.  Allergies  Allergen Reactions   Niacin And Related Other (See Comments)    Redness, flushing   Ppd [Tuberculin Purified Protein Derivative]     Unknown rxn (MAR)   Tubersol [Tuberculin, Ppd] Other (See Comments)    Unknown rxn (MAR)   Firvanq  [Vancomycin ] Other (See Comments)    Red man syndrome   Vibramycin  [Doxycycline ] Rash    Medications   Current Facility-Administered Medications:    sodium chloride  flush (NS) 0.9 % injection 3 mL, 3 mL, Intravenous, Once, Curatolo, Adam, DO  Current Outpatient Medications:    acetaminophen  (TYLENOL ) 650 MG CR tablet, Take 650 mg by mouth every 8 (eight) hours as needed for pain., Disp: , Rfl:    albuterol  (VENTOLIN  HFA) 108 (90 Base) MCG/ACT inhaler, Inhale 2 puffs into the lungs every 6 (six) hours as needed for wheezing or shortness of breath., Disp: 1 each, Rfl: 6   allopurinol  (ZYLOPRIM ) 100 MG tablet, Take 1 tablet (100 mg total) by mouth daily., Disp: , Rfl:    apixaban  (ELIQUIS ) 5 MG TABS tablet, Take 1 tablet (5 mg  total) by mouth 2 (two) times daily., Disp: 180 tablet, Rfl: 3   ARIPiprazole  (ABILIFY ) 5 MG tablet, TAKE 1 TABLET BY MOUTH EVERYDAY AT BEDTIME, Disp: 60 tablet, Rfl: 2   atorvastatin  (LIPITOR) 40 MG tablet, Take 40 mg by mouth at bedtime., Disp: , Rfl:    bisacodyl  (DULCOLAX) 5 MG EC tablet, Take 10 mg by mouth daily as needed for moderate constipation., Disp: , Rfl:    busPIRone  (BUSPAR ) 10 MG tablet, Take 1 tablet (10 mg total) by mouth 3 (three) times daily., Disp: 270 tablet, Rfl: 0   colchicine  0.6 MG tablet, Take 1 tablet (0.6 mg total) by mouth daily., Disp: 30 tablet, Rfl: 0   cyanocobalamin  (VITAMIN B12) 1000 MCG tablet, Take 1,000 mcg by mouth daily., Disp: , Rfl:    fenofibrate  (TRICOR ) 145 MG tablet, Take 1 tablet (145 mg total) by mouth daily., Disp: 90 tablet, Rfl: 3   ferrous sulfate  325 (65 FE) MG tablet, Take 325 mg by mouth daily., Disp: , Rfl:    fluticasone  (FLONASE ) 50 MCG/ACT nasal spray, Place 2 sprays into both nostrils daily. , Disp: , Rfl:    guaiFENesin -dextromethorphan (ROBITUSSIN DM) 100-10 MG/5ML syrup, Take 5 mLs by mouth every 4 (four) hours as needed for cough., Disp: , Rfl:    HUMALOG KWIKPEN 100 UNIT/ML KwikPen, Inject 2-15 Units into the skin. 121-150 2 units 151-200 3 units  201-250 5 units  251-300 8 units  301-350 11 units  351-400 15 units, Disp: , Rfl:    HYDROcodone -acetaminophen  (NORCO/VICODIN) 5-325 MG tablet, Take 1 tablet by mouth every 6 (six) hours as needed for severe pain (pain score 7-10)., Disp: 10 tablet, Rfl: 0   INCRUSE ELLIPTA  62.5 MCG/ACT AEPB, Inhale 1 puff into the lungs daily., Disp: , Rfl:    Lacosamide  150 MG TABS, Take 1 tablet (150 mg total) by mouth 2 (two) times daily., Disp: 10 tablet, Rfl: 0   levETIRAcetam  (KEPPRA ) 500 MG tablet, Take 1 tablet (500 mg total) by mouth 2 (two) times daily., Disp: 180 tablet, Rfl: 3   loperamide  (IMODIUM ) 2 MG capsule, Take 2 mg by mouth daily as needed for diarrhea or loose stools., Disp: , Rfl:     methocarbamol  (ROBAXIN ) 500 MG tablet, Take 500 mg by mouth every 8 (eight) hours as needed for muscle spasms., Disp: , Rfl:    metolazone  (  ZAROXOLYN ) 2.5 MG tablet, Take 2.5 mg by mouth once a week. Every Tuesday., Disp: , Rfl:    midodrine  (PROAMATINE ) 5 MG tablet, Take 1 tablet (5 mg total) by mouth 3 (three) times daily with meals., Disp: , Rfl:    mirtazapine  (REMERON ) 15 MG tablet, Take 15 mg by mouth at bedtime., Disp: , Rfl:    omega-3 acid ethyl esters (LOVAZA) 1 g capsule, Take 2 g by mouth 2 (two) times daily., Disp: , Rfl:    omeprazole (PRILOSEC) 20 MG capsule, Take 20 mg by mouth daily., Disp: , Rfl:    polyethylene glycol powder (GLYCOLAX /MIRALAX ) 17 GM/SCOOP powder, Mix in water as directed and take 1 capful (17 g) by mouth daily., Disp: 238 g, Rfl: 0   potassium chloride  SA (KLOR-CON  M20) 20 MEQ tablet, Take 1-2 tablets (20-40 mEq total) by mouth See admin instructions. Take 2 tablets in the morning and 1 tablet in the evening (Patient taking differently: Take 20-40 mEq by mouth See admin instructions. Take 40 meq in the morning and 20 meq in the evening.), Disp: , Rfl:    prazosin  (MINIPRESS ) 2 MG capsule, Take 2 mg by mouth at bedtime., Disp: , Rfl:    pregabalin  (LYRICA ) 150 MG capsule, Take 1 capsule (150 mg total) by mouth 3 (three) times daily., Disp: 15 capsule, Rfl: 0   rOPINIRole  (REQUIP ) 0.25 MG tablet, Take 1 tablet (0.25 mg total) by mouth 2 (two) times daily., Disp: , Rfl:    sennosides-docusate sodium  (SENOKOT-S) 8.6-50 MG tablet, Take 2 tablets by mouth in the morning and at bedtime., Disp: , Rfl:    spironolactone  (ALDACTONE ) 25 MG tablet, Take 12.5 mg by mouth daily., Disp: , Rfl:    tirzepatide  (MOUNJARO ) 15 MG/0.5ML Pen, INJECT 15 MG INTO THE SKIN ONCE A WEEK. (Patient taking differently: Inject 15 mg into the skin once a week. Mondays), Disp: 6 mL, Rfl: 0   torsemide  (DEMADEX ) 20 MG tablet, Take 1 tablet (20 mg total) by mouth 2 (two) times daily., Disp: , Rfl:     traZODone  (DESYREL ) 100 MG tablet, TAKE 2 TABLETS BY MOUTH AT BEDTIME, Disp: 60 tablet, Rfl: 1   venlafaxine  XR (EFFEXOR -XR) 150 MG 24 hr capsule, Take 150 mg by mouth daily with breakfast., Disp: , Rfl:    insulin  aspart (NOVOLOG ) 100 UNIT/ML injection, Inject 0-15 Units into the skin 3 (three) times daily with meals. Sliding scale  CBG 70 - 120: 0 units: CBG 121 - 150: 2 units; CBG 151 - 200: 3 units; CBG 201 - 250: 5 units; CBG 251 - 300: 8 units;CBG 301 - 350: 11 units; CBG 351 - 400: 15 units; CBG > 400 : 15 units and notify MD (Patient not taking: Reported on 12/15/2023), Disp: , Rfl:    linezolid  (ZYVOX ) 600 MG tablet, Take 600 mg by mouth 2 (two) times daily. (Patient not taking: Reported on 12/15/2023), Disp: , Rfl:    metoprolol  succinate (TOPROL -XL) 50 MG 24 hr tablet, Take 50 mg by mouth daily. (Patient not taking: Reported on 12/15/2023), Disp: , Rfl:   Vitals   Vitals:   12/15/23 1018 12/15/23 1030 12/15/23 1045 12/15/23 1115  BP:  128/69 (!) 114/53 125/68  Pulse:  96 (!) 57 99  Resp:  20 20 (!) 21  Temp:      TempSrc:      SpO2:  94% 97% 94%  Weight: (!) 155 kg     Height: 6\' 2"  (1.88 m)  Body mass index is 43.87 kg/m.   Physical Exam   General: Laying comfortably in bed; in no acute distress.  HENT: Normal oropharynx and mucosa. Normal external appearance of ears and nose.  Neck: Supple, no pain or tenderness  CV: No JVD. No peripheral edema.  Pulmonary: Symmetric Chest rise. Normal respiratory effort.  Abdomen: Soft to touch, non-tender.  Ext: No cyanosis, edema, or deformity  Skin: No rash. Normal palpation of skin.   Musculoskeletal: Normal digits and nails by inspection. No clubbing.   Neurologic Examination  Mental status/Cognition: somnolent, oriented to self, place, month and year, good attention.  Speech/language: slight dysarthric speech, fluent, comprehension intact, object naming intact, repetition intact. Cranial nerves:   CN II Pupils equal and  reactive to light, no VF deficits    CN III,IV,VI EOM intact, no gaze preference or deviation, no nystagmus    CN V normal sensation in V1, V2, and V3 segments bilaterally    CN VII Maybe slight left facial droop. Disappears when he is talking to me, only noticeable when I actually ask him to smile.   CN VIII normal hearing to speech    CN IX & X normal palatal elevation, no uvular deviation    CN XI 5/5 head turn and 5/5 shoulder shrug bilaterally   CN XII midline tongue protrusion    Motor:  Muscle bulk: normal, tone normal. Mvmt Root Nerve  Muscle Right Left Comments  SA C5/6 Ax Deltoid     EF C5/6 Mc Biceps     EE C6/7/8 Rad Triceps 5 4   WF C6/7 Med FCR 5 4   WE C7/8 PIN ECU     F Ab C8/T1 U ADM/FDI 5 4   HF L1/2/3 Fem Illopsoas 3 3   KE L2/3/4 Fem Quad     DF L4/5 D Peron Tib Ant 5 5   PF S1/2 Tibial Grc/Sol 5 5    Sensation:  Light touch Decreased somewhat on the left.   Pin prick    Temperature    Vibration   Proprioception    Coordination/Complex Motor:  - Finger to Nose intact BL - Heel to shin unable to do. - Rapid alternating movement are slowed throughout - Gait: eferred for patient safety, uses wheelchair at baseline.  Labs/Imaging/Neurodiagnostic studies   CBC:  Recent Labs  Lab 12-31-23 0940 31-Dec-2023 0943  WBC 13.6*  --   NEUTROABS 11.0*  --   HGB 13.9 14.6  HCT 40.7 43.0  MCV 88.7  --   PLT 207  --    Basic Metabolic Panel:  Lab Results  Component Value Date   NA 133 (L) 2023-12-31   K 3.7 12/31/23   CO2 26 Dec 31, 2023   GLUCOSE 242 (H) Dec 31, 2023   BUN 24 (H) 2023/12/31   CREATININE 1.20 2023-12-31   CALCIUM  8.9 2023-12-31   GFRNONAA >60 12-31-2023   GFRAA 77 05/20/2020   Lipid Panel:  Lab Results  Component Value Date   LDLCALC 79 06/25/2023   HgbA1c:  Lab Results  Component Value Date   HGBA1C 6.6 (H) 10/04/2023   Urine Drug Screen:     Component Value Date/Time   LABOPIA NONE DETECTED 07/14/2023 0528   COCAINSCRNUR  NONE DETECTED 07/14/2023 0528   LABBENZ NONE DETECTED 07/14/2023 0528   AMPHETMU NONE DETECTED 07/14/2023 0528   THCU NONE DETECTED 07/14/2023 0528   LABBARB NONE DETECTED 07/14/2023 0528    Alcohol  Level     Component Value Date/Time  ETH <15 12/15/2023 0940   INR  Lab Results  Component Value Date   INR 1.2 12/15/2023   APTT  Lab Results  Component Value Date   APTT 28 12/15/2023   AED levels:  Lab Results  Component Value Date   LEVETIRACETA 23.2 09/10/2021    CT Head without contrast(Personally reviewed): CTH was negative for a large hypodensity concerning for a large territory infarct or hyperdensity concerning for an ICH  MRI Brain(Personally reviewed): pending   ASSESSMENT   Lawrence [name] is a 68 y.o. male obesity, prior stroke, afibb on eliquis , recent L cerebellar stroke p/w somnolence and worsening left sided weakness. Seems similr to prior neuro exam.  Recommend MRI brain w/o contrast and if negative for stroke, no further workup.  RECOMMENDATIONS  - MRI Brain w/o contrast. If negative for stroke, no further workup. ______________________________________________________________________    Signed, Dena Esperanza, MD [facility]

## 2023-12-15 NOTE — Code Documentation (Signed)
 Stroke Response Nurse Documentation Code Documentation  Lawrence [name] is a 68 y.o. male arriving to [facility]  via [facility] on 12/15/2023 with past medical hx including CVA AF DM obesity CHF . On Eliquis  (apixaban ) daily. Code stroke was activated by EMS.   Patient from [facility] where he was LKW at 0800 and now complaining of new left arm and face weakness and dysarthria.  Stroke team at the bedside on patient arrival. Labs drawn and patient cleared for CT by Dr. Colonel Dears. Patient to CT with team. NIHSS 18, see documentation for details and code stroke times. Patient with decreased LOC, disoriented, bilateral facial droop, bilateral arm weakness, bilateral leg weakness, left decreased sensation, and dysarthria  on exam. The following imaging was completed:  CT Head. Patient is not a candidate for IV Thrombolytic due to Eliquis . Patient is not a candidate for IR due to no LVO symptoms present..   Care Plan: q 2 NIHSS and VS x 12 then q 4.     Bedside handoff with ED RN Lawrence [name].    Lawrence [name]  Stroke Response RN

## 2023-12-15 NOTE — ED Provider Notes (Addendum)
 [place] EMERGENCY DEPARTMENT AT [facility] Provider Note   CSN: [id number] Arrival date & time: 12/15/23  8469  An emergency department physician performed an initial assessment on this suspected stroke patient at 0935.  History  Chief Complaint  Patient presents with   Code Stroke    Lawrence [name] is a 68 y.o. male.  Patient arrives as a code stroke from [employer].  Patient woke up at 8 AM normal.  Family noticed slurred speech leg weakness bilateral arms weakness.  EMS noticed slurred speech left facial droop able to answer questions was slow to respond.  He had bilateral weakness in his arms.  He denies any complaints.  Denies any chest pain shortness of breath.  He is slow to answer questions.  History is pretty limited as he just appears, globally weak not really wanting to respond to questions at times but he states that he is happy where he is living and he is not anxious not depressed not having chest pain.  The history is provided by the patient.       Home Medications Prior to Admission medications   Medication Sig Start Date End Date Taking? Authorizing Provider  acetaminophen  (TYLENOL ) 650 MG CR tablet Take 650 mg by mouth every 8 (eight) hours as needed for pain.   Yes [provider]  albuterol  (VENTOLIN  HFA) 108 (90 Base) MCG/ACT inhaler Inhale 2 puffs into the lungs every 6 (six) hours as needed for wheezing or shortness of breath. 04/17/23  Yes Hunsucker, Archer Kobs, MD  allopurinol  (ZYLOPRIM ) 100 MG tablet Take 1 tablet (100 mg total) by mouth daily. 07/25/23 07/24/24 Yes Elgergawy, Ardia Kraft, MD  apixaban  (ELIQUIS ) 5 MG TABS tablet Take 1 tablet (5 mg total) by mouth 2 (two) times daily. 04/17/23  Yes Darlis Eisenmenger, MD  ARIPiprazole  (ABILIFY ) 5 MG tablet TAKE 1 TABLET BY MOUTH EVERYDAY AT BEDTIME 10/03/23  Yes Mozingo, Regina Nattalie, NP  atorvastatin  (LIPITOR) 40 MG tablet Take 40 mg by mouth at bedtime. 07/14/23  Yes [provider]  bisacodyl  (DULCOLAX) 5 MG EC tablet Take 10 mg by mouth daily as needed for moderate constipation.   Yes [provider]  busPIRone  (BUSPAR ) 10 MG tablet Take 1 tablet (10 mg total) by mouth 3 (three) times daily. 04/17/23  Yes Mozingo, Regina Nattalie, NP  colchicine  0.6 MG tablet Take 1 tablet (0.6 mg total) by mouth daily. 07/12/23  Yes Love, Renay Carota, PA-C  cyanocobalamin  (VITAMIN B12) 1000 MCG tablet Take 1,000 mcg by mouth daily.   Yes [provider]  fenofibrate  (TRICOR ) 145 MG tablet Take 1 tablet (145 mg total) by mouth daily. 04/17/23  Yes Darlis Eisenmenger, MD  ferrous sulfate  325 (65 FE) MG tablet Take 325 mg by mouth daily.   Yes [provider]  fluticasone  (FLONASE ) 50 MCG/ACT nasal spray Place 2 sprays into both nostrils daily.    Yes [provider]  guaiFENesin -dextromethorphan (ROBITUSSIN DM) 100-10 MG/5ML syrup Take 5 mLs by mouth every 4 (four) hours as needed for cough. 10/25/23  Yes Rai, Ripudeep K, MD  HUMALOG KWIKPEN 100 UNIT/ML KwikPen Inject 2-15 Units into the skin. 121-150 2 units 151-200 3 units  201-250 5 units  251-300 8 units  301-350 11 units  351-400 15 units 12/11/23  Yes [provider]  HYDROcodone -acetaminophen  (NORCO/VICODIN) 5-325 MG tablet Take 1 tablet by mouth every 6 (six) hours as needed for severe pain (pain score 7-10). 12/08/23  Yes  Elgergawy, Ardia Kraft, MD  INCRUSE ELLIPTA  62.5 MCG/ACT AEPB Inhale 1 puff into the lungs daily. 12/11/23  Yes [provider]  Lacosamide  150 MG TABS Take 1 tablet (150 mg total) by mouth 2 (two) times daily. 10/10/23  Yes Verlyn Goad, MD  levETIRAcetam  (KEPPRA ) 500 MG tablet Take 1 tablet (500 mg total) by mouth 2 (two) times daily. 05/04/23  Yes Cassandra Cleveland, MD  loperamide  (IMODIUM ) 2 MG capsule Take 2 mg by mouth daily as needed for diarrhea or loose stools.   Yes [provider]  methocarbamol  (ROBAXIN ) 500 MG tablet Take 500 mg by mouth every 8 (eight) hours  as needed for muscle spasms.   Yes [provider]  metolazone  (ZAROXOLYN ) 2.5 MG tablet Take 2.5 mg by mouth once a week. Every Tuesday. 09/20/23  Yes [provider]  midodrine  (PROAMATINE ) 5 MG tablet Take 1 tablet (5 mg total) by mouth 3 (three) times daily with meals. 12/08/23  Yes Elgergawy, Ardia Kraft, MD  mirtazapine  (REMERON ) 15 MG tablet Take 15 mg by mouth at bedtime. 07/14/23  Yes [provider]  omega-3 acid ethyl esters (LOVAZA) 1 g capsule Take 2 g by mouth 2 (two) times daily.   Yes [provider]  omeprazole (PRILOSEC) 20 MG capsule Take 20 mg by mouth daily. 05/29/20  Yes [provider]  polyethylene glycol powder (GLYCOLAX /MIRALAX ) 17 GM/SCOOP powder Mix in water as directed and take 1 capful (17 g) by mouth daily. 07/12/23  Yes Love, Renay Carota, PA-C  potassium chloride  SA (KLOR-CON  M20) 20 MEQ tablet Take 1-2 tablets (20-40 mEq total) by mouth See admin instructions. Take 2 tablets in the morning and 1 tablet in the evening Patient taking differently: Take 20-40 mEq by mouth See admin instructions. Take 40 meq in the morning and 20 meq in the evening. 10/10/23  Yes Verlyn Goad, MD  prazosin  (MINIPRESS ) 2 MG capsule Take 2 mg by mouth at bedtime. 08/17/23  Yes [provider]  pregabalin  (LYRICA ) 150 MG capsule Take 1 capsule (150 mg total) by mouth 3 (three) times daily. 10/10/23  Yes Verlyn Goad, MD  rOPINIRole  (REQUIP ) 0.25 MG tablet Take 1 tablet (0.25 mg total) by mouth 2 (two) times daily. 07/25/23  Yes Elgergawy, Ardia Kraft, MD  sennosides-docusate sodium  (SENOKOT-S) 8.6-50 MG tablet Take 2 tablets by mouth in the morning and at bedtime.   Yes [provider]  spironolactone  (ALDACTONE ) 25 MG tablet Take 12.5 mg by mouth daily. 09/01/23  Yes [provider]  tirzepatide  (MOUNJARO ) 15 MG/0.5ML Pen INJECT 15 MG INTO THE SKIN ONCE A WEEK. Patient taking differently: Inject 15 mg into the skin once a week. Mondays  06/07/23  Yes Motwani, Komal, MD  torsemide  (DEMADEX ) 20 MG tablet Take 1 tablet (20 mg total) by mouth 2 (two) times daily. 12/08/23  Yes Elgergawy, Ardia Kraft, MD  traZODone  (DESYREL ) 100 MG tablet TAKE 2 TABLETS BY MOUTH AT BEDTIME 11/09/23  Yes Mozingo, Regina Nattalie, NP  venlafaxine  XR (EFFEXOR -XR) 150 MG 24 hr capsule Take 150 mg by mouth daily with breakfast.   Yes [provider]  insulin  aspart (NOVOLOG ) 100 UNIT/ML injection Inject 0-15 Units into the skin 3 (three) times daily with meals. Sliding scale  CBG 70 - 120: 0 units: CBG 121 - 150: 2 units; CBG 151 - 200: 3 units; CBG 201 - 250: 5 units; CBG 251 - 300: 8 units;CBG 301 - 350: 11 units; CBG 351 - 400: 15  units; CBG > 400 : 15 units and notify MD Patient not taking: Reported on 12/15/2023 10/25/23   Rai, Hurman Maiden, MD  linezolid  (ZYVOX ) 600 MG tablet Take 600 mg by mouth 2 (two) times daily. Patient not taking: Reported on 12/15/2023    [provider]  metoprolol  succinate (TOPROL -XL) 50 MG 24 hr tablet Take 50 mg by mouth daily. Patient not taking: Reported on 12/15/2023 12/11/23   [provider]      Allergies    Niacin and related; Ppd [tuberculin purified protein derivative]; Tubersol [tuberculin, ppd]; Firvanq  [vancomycin ]; and Vibramycin  [doxycycline ]    Review of Systems   Review of Systems  Physical Exam Updated Vital Signs BP 125/68   Pulse 99   Temp 97.8 F (36.6 C) (Oral)   Resp (!) 21   Ht 6\' 2"  (1.88 m)   Wt (!) 155 kg   SpO2 94%   BMI 43.87 kg/m  Physical Exam Vitals and nursing note reviewed.  Constitutional:      General: He is not in acute distress.    Appearance: He is well-developed. He is not ill-appearing.  HENT:     Head: Normocephalic and atraumatic.     Nose: Nose normal.     Mouth/Throat:     Mouth: Mucous membranes are moist.  Eyes:     Extraocular Movements: Extraocular movements intact.     Conjunctiva/sclera: Conjunctivae normal.     Pupils: Pupils are equal,  round, and reactive to light.  Cardiovascular:     Rate and Rhythm: Normal rate and regular rhythm.     Pulses: Normal pulses.     Heart sounds: No murmur heard. Pulmonary:     Effort: Pulmonary effort is normal. No respiratory distress.     Breath sounds: Normal breath sounds.  Abdominal:     Palpations: Abdomen is soft.     Tenderness: There is no abdominal tenderness.  Musculoskeletal:        General: No swelling.     Cervical back: Normal range of motion and neck supple.  Skin:    General: Skin is warm and dry.     Capillary Refill: Capillary refill takes less than 2 seconds.  Neurological:     Mental Status: He is alert.     Comments: Overall hard to do neurologic exam, he has clear speech I do not appreciate any facial droop, he seems to move both arms away when I raise them up in the air over the top of his face but does not really give great effort on any sort of strength exam in all 4 extremities, sensation appears to be intact overall pretty difficult to get a decent exam on him     ED Results / Procedures / Treatments   Labs (all labs ordered are listed, but only abnormal results are displayed) Labs Reviewed  CBC - Abnormal; Notable for the following components:      Result Value   WBC 13.6 (*)    All other components within normal limits  DIFFERENTIAL - Abnormal; Notable for the following components:   Neutro Abs 11.0 (*)    Monocytes Absolute 1.3 (*)    All other components within normal limits  COMPREHENSIVE METABOLIC PANEL WITH GFR - Abnormal; Notable for the following components:   Sodium 133 (*)    Chloride 95 (*)    Glucose, Bld 249 (*)    Creatinine, Ser 1.26 (*)    Albumin 3.4 (*)    All other components  within normal limits  CBG MONITORING, ED - Abnormal; Notable for the following components:   Glucose-Capillary 257 (*)    All other components within normal limits  POCT I-STAT, CHEM 8 - Abnormal; Notable for the following components:   Sodium 133 (*)     Chloride 94 (*)    BUN 24 (*)    Glucose, Bld 242 (*)    Calcium , Ion 1.07 (*)    All other components within normal limits  PROTIME-INR  APTT  ETHANOL  URINALYSIS, ROUTINE W REFLEX MICROSCOPIC  I-STAT CHEM 8, ED    EKG None  Radiology CT HEAD CODE STROKE WO CONTRAST Result Date: 12/15/2023 CLINICAL DATA:  Code stroke. Neuro deficit, acute, stroke suspected. Left-sided facial droop. EXAM: CT HEAD WITHOUT CONTRAST TECHNIQUE: Contiguous axial images were obtained from the base of the skull through the vertex without intravenous contrast. RADIATION DOSE REDUCTION: This exam was performed according to the departmental dose-optimization program which includes automated exposure control, adjustment of the mA and/or kV according to patient size and/or use of iterative reconstruction technique. COMPARISON:  Prior head CT examinations 11/27/2023 and earlier. Brain MRI 07/14/2023. FINDINGS: Brain: Mild generalized parenchymal atrophy. There is no acute intracranial hemorrhage. No demarcated cortical infarct. No extra-axial fluid collection. No evidence of an intracranial mass. No midline shift. Vascular: No hyperdense vessel.  Atherosclerotic calcifications. Skull: No calvarial fracture or aggressive osseous lesion. Sinuses/Orbits: No mass or acute finding within the imaged orbits. Minimal mucosal thickening within the bilateral maxillary sinuses. Minimal mucosal thickening, and small-volume secretions, within the right sphenoid sinus. Mild mucosal thickening within the bilateral ethmoid sinuses. Minimal mucosal thickening within the frontal sinuses. ASPECTS [facility] Stroke Program Early CT Score) - Ganglionic level infarction (caudate, lentiform nuclei, internal capsule, insula, M1-M3 cortex): 7 - Supraganglionic infarction (M4-M6 cortex): 3 Total score (0-10 with 10 being normal): 10 No evidence of an acute intracranial abnormality. These results were communicated to Dr. Murvin Arthurs At 9:54 amon  5/9/2025by text page via the [facility] messaging system. IMPRESSION: 1. No evidence of an acute intracranial abnormality. 2. Mild generalized parenchymal atrophy. 3. Paranasal sinus disease as described. Electronically Signed   By: Bascom Lily D.O.   On: 12/15/2023 09:54    Procedures Procedures    Medications Ordered in ED Medications  sodium chloride  flush (NS) 0.9 % injection 3 mL (3 mLs Intravenous Not Given 12/15/23 1026)    ED Course/ Medical Decision Making/ A&P                                 Medical Decision Making Amount and/or Complexity of Data Reviewed Labs: ordered. Radiology: ordered.  Risk Decision regarding hospitalization.   [name] is here as a code stroke.  Hypertension depression anxiety PTSD COPD diabetes history CAD history.  Does not really have any comp of chest pain or shortness of breath or pain with urination or abdominal pain or vomiting.  Patient is wheelchair-bound.  But he seems to be moving his ankles okay but does not appear to give any good effort with his arms they both come to see him equally with but overall he sort of has a lethargic look he will not open his eyes but he will answer questions pretty clearly and easily when provoked.  We talked about anxiety and depression if he has been doing okay's other than really now.  She has had several stroke workups here over the last year  or so.  Neurology at the bedside upon arrival.  He is on Eliquis  he will be a TNK candidate.  His head CT was unremarkable.  Differential could be some sort of conversion process but will check labs.  Does not sound like this any infectious process.  Will await neurology recommendations to see if they want an MRI given that he is had a couple workups here recently for the same.  Will continue to monitor.  Overall lab works unremarkable.  He seems to be improving.  Sounds like his wife needs to bring his spinal cord stimulator to turn it off before he can get MRI done.     Wife states she cannot bring in the remote today.  I have made neurology aware of this.  They recommend admission.  Plan for hopefully MRI tomorrow if there is issues we will try to get MRI tomorrow might need to just repeat a CT scan.    This chart was dictated using voice recognition software.  Despite best efforts to proofread,  errors can occur which can change the documentation meaning.     Final Clinical Impression(s) / ED Diagnoses Final diagnoses:  Weakness    Rx / DC Orders ED Discharge Orders     None         Lowery Rue, DO 12/15/23 1311    Kenneth Cuaresma, DO 12/15/23 1400

## 2023-12-15 NOTE — H&P (Signed)
 History and Physical    [name] GNF:[id number] DOB: 30-Aug-1955 DOA: 12/15/2023  PCP: Darnelle Elders, PA-C  Patient coming from: [name]  I have personally briefly reviewed patient's old medical records in [facility]  Chief Complaint: Left-sided weakness/slurred speech  HPI: [name] is a 68 y.o. male with medical history significant of COPD, severe aortic stenosis status post TAVR in July 2024, seizure disorder, chronic diastolic CHF, A-fib on Eliquis , CVA, gout, OSA on CPAP nightly, anxiety, BPH, chronic venous stasis, morbid obesity presenting to the ED with worsening left-sided weakness as well as some slurred speech which occurred on the morning of admission.  Patient noted to have been at his baseline around 8 AM on the day of admission.  It is noted that EMS had noticed slurred speech, left facial droop and patient was slow to respond to questions with some bilateral weakness per ED physician's note.  Patient states whole episode was hazy to him and cannot quite remember every single detail.  Patient noted to be globally weak.  Patient denies any recent fever, no chills, no nausea, no vomiting, no shortness of breath, no chest pain, no abdominal pain, no diarrhea, no constipation, no dysuria, no melena, no hematemesis, no hematochezia, no lightheadedness, no syncopal episode.  Patient did endorse some increased left-sided weakness from his baseline. - Patient recently hospitalized 11/27/2023-12/08/2023 for sepsis secondary to Enterococcus bacteremia as well as an incidental recent COVID-19 infection.  ED Course: Patient seen in the ED, vital signs stable.  Comprehensive metabolic profile with a sodium of 133, chloride of 95, glucose of 249, creatinine at 1.26, albumin of 3.4 otherwise within normal limits.  CBC with a white count of 13.6 otherwise within normal limits.  INR of 1.2.  Alcohol  level < 15. - CT head done with no evidence of acute  intracranial abnormality, mild generalized parenchymal atrophy, paranasal sinus disease as described.  Chest x-ray with no acute infiltrate noted.  Urinalysis ordered and pending. - Patient seen in consultation by neurology recommending MRI brain for further evaluation and management.  Review of Systems: As per HPI otherwise all other systems reviewed and are negative.  Past Medical History:  Diagnosis Date   Acquired dilation of ascending aorta and aortic root (HCC)    40mm by echo 01/2021   Adenomatous colon polyp 2007   Anemia    Anxiety    Asthma    BPH without obstruction/lower urinary tract symptoms 02/22/2017   Chronic diastolic (congestive) heart failure (HCC)    Chronic venous stasis 03/07/2019   COPD (chronic obstructive pulmonary disease) (HCC)    Coronary artery calcification seen on CAT scan    Depression    Diabetic neuropathy (HCC) 09/11/2019   History of colon polyps 08/24/2018   Hypertension    Morbid obesity (HCC)    OSA (obstructive sleep apnea)    Pain due to onychomycosis of toenails of both feet 09/11/2019   Peripheral neuropathy 02/22/2017   Primary osteoarthritis, left shoulder 03/05/2017   PTSD (post-traumatic stress disorder)    Pure hypercholesterolemia    QT prolongation 03/07/2019   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 Lorie Rook and Dr. Sherene Dilling   Seizures [facility])    Severe aortic stenosis    Sinus tachycardia 03/07/2019   Sleep apnea    CPAP   Type 2 diabetes mellitus with vascular disease (HCC) 09/11/2019    Past Surgical History:  Procedure  Laterality Date   ENDOVENOUS ABLATION SAPHENOUS VEIN W/ LASER Right 08/20/2020   endovenous laser ablation right greater saphenous vein by Kirtland Perfect MD    ENDOVENOUS ABLATION SAPHENOUS VEIN W/ LASER Left 11/16/2022   endovenous laser ablation left greater saphenous vein by Kirtland Perfect MD   INTRAOPERATIVE TRANSTHORACIC ECHOCARDIOGRAM N/A  02/07/2023   Procedure: INTRAOPERATIVE TRANSTHORACIC ECHOCARDIOGRAM;  Surgeon: Kyra Phy, MD;  Location: [facility] INVASIVE CV LAB;  Service: Open Heart Surgery;  Laterality: N/A;   JOINT REPLACEMENT     left knee replacement x 2   KNEE ARTHROSCOPY Bilateral    LEFT HEART CATH AND CORONARY ANGIOGRAPHY N/A 01/17/2018   Procedure: LEFT HEART CATH AND CORONARY ANGIOGRAPHY;  Surgeon: Arleen Lacer, MD;  Location: [facility] INVASIVE CV LAB;  Service: Cardiovascular;  Laterality: N/A;   PRESSURE SENSOR/CARDIOMEMS N/A 08/26/2021   Procedure: PRESSURE SENSOR/CARDIOMEMS;  Surgeon: Darlis Eisenmenger, MD;  Location: [facility] INVASIVE CV LAB;  Service: Cardiovascular;  Laterality: N/A;   RIGHT HEART CATH N/A 08/26/2021   Procedure: RIGHT HEART CATH;  Surgeon: Darlis Eisenmenger, MD;  Location: [facility] INVASIVE CV LAB;  Service: Cardiovascular;  Laterality: N/A;   RIGHT HEART CATH AND CORONARY ANGIOGRAPHY N/A 01/26/2023   Procedure: RIGHT HEART CATH AND CORONARY ANGIOGRAPHY;  Surgeon: Kyra Phy, MD;  Location: [facility] INVASIVE CV LAB;  Service: Cardiovascular;  Laterality: N/A;   TEE WITHOUT CARDIOVERSION N/A 08/26/2021   Procedure: TRANSESOPHAGEAL ECHOCARDIOGRAM (TEE);  Surgeon: Darlis Eisenmenger, MD;  Location: [facility] ENDOSCOPY;  Service: Cardiovascular;  Laterality: N/A;   TOOTH EXTRACTION N/A 02/03/2023   Procedure: DENTAL RESTORATION/EXTRACTIONS;  Surgeon: Ascencion Lava, DMD;  Location: [facility] OR;  Service: Oral Surgery;  Laterality: N/A;   TRANSCATHETER AORTIC VALVE REPLACEMENT, TRANSFEMORAL Right 02/07/2023   Procedure: Transcatheter Aortic Valve Replacement, Transfemoral;  Surgeon: Kyra Phy, MD;  Location: [facility] INVASIVE CV LAB;  Service: Open Heart Surgery;  Laterality: Right;   TRANSESOPHAGEAL ECHOCARDIOGRAM (CATH LAB) N/A 12/07/2023   Procedure: TRANSESOPHAGEAL ECHOCARDIOGRAM;  Surgeon: Jann Melody, MD;  Location: [facility] INVASIVE CV LAB;  Service: Cardiovascular;  Laterality: N/A;   UMBILICAL HERNIA REPAIR       Social History  reports that he quit smoking about 7 years ago. His smoking use included cigarettes. He started smoking about 51 years ago. He has a 44 pack-year smoking history. He has been exposed to tobacco smoke. He has never used smokeless tobacco. He reports current alcohol  use. He reports that he does not use drugs.  Allergies  Allergen Reactions   Niacin And Related Other (See Comments)    Redness, flushing   Ppd [Tuberculin Purified Protein Derivative]     Unknown rxn (MAR)   Tubersol [Tuberculin, Ppd] Other (See Comments)    Unknown rxn (MAR)   Firvanq  [Vancomycin ] Other (See Comments)    Red man syndrome   Vibramycin  [Doxycycline ] Rash    Family History  Problem Relation Age of Onset   CAD Maternal Grandfather    Diabetes Other    Diabetes Mellitus II Neg Hx    Colon cancer Neg Hx    Esophageal cancer Neg Hx    Inflammatory bowel disease Neg Hx    Liver disease Neg Hx    Pancreatic cancer Neg Hx    Rectal cancer Neg Hx    Stomach cancer Neg Hx    Sleep apnea Neg Hx    Mother deceased in his 52s from stroke.  Father deceased in his 11s from cancer per patient.  Prior  to Admission medications   Medication Sig Start Date End Date Taking? Authorizing Provider  acetaminophen  (TYLENOL ) 650 MG CR tablet Take 650 mg by mouth every 8 (eight) hours as needed for pain.   Yes [provider]  albuterol  (VENTOLIN  HFA) 108 (90 Base) MCG/ACT inhaler Inhale 2 puffs into the lungs every 6 (six) hours as needed for wheezing or shortness of breath. 04/17/23  Yes Hunsucker, Archer Kobs, MD  allopurinol  (ZYLOPRIM ) 100 MG tablet Take 1 tablet (100 mg total) by mouth daily. 07/25/23 07/24/24 Yes Elgergawy, Ardia Kraft, MD  apixaban  (ELIQUIS ) 5 MG TABS tablet Take 1 tablet (5 mg total) by mouth 2 (two) times daily. 04/17/23  Yes Darlis Eisenmenger, MD  ARIPiprazole  (ABILIFY ) 5 MG tablet TAKE 1 TABLET BY MOUTH EVERYDAY AT BEDTIME 10/03/23  Yes Mozingo, Regina Nattalie, NP  atorvastatin   (LIPITOR) 40 MG tablet Take 40 mg by mouth at bedtime. 07/14/23  Yes [provider]  bisacodyl  (DULCOLAX) 5 MG EC tablet Take 10 mg by mouth daily as needed for moderate constipation.   Yes [provider]  busPIRone  (BUSPAR ) 10 MG tablet Take 1 tablet (10 mg total) by mouth 3 (three) times daily. 04/17/23  Yes Mozingo, Regina Nattalie, NP  colchicine  0.6 MG tablet Take 1 tablet (0.6 mg total) by mouth daily. 07/12/23  Yes Love, Renay Carota, PA-C  cyanocobalamin  (VITAMIN B12) 1000 MCG tablet Take 1,000 mcg by mouth daily.   Yes [provider]  fenofibrate  (TRICOR ) 145 MG tablet Take 1 tablet (145 mg total) by mouth daily. 04/17/23  Yes Darlis Eisenmenger, MD  ferrous sulfate  325 (65 FE) MG tablet Take 325 mg by mouth daily.   Yes [provider]  fluticasone  (FLONASE ) 50 MCG/ACT nasal spray Place 2 sprays into both nostrils daily.    Yes [provider]  guaiFENesin -dextromethorphan (ROBITUSSIN DM) 100-10 MG/5ML syrup Take 5 mLs by mouth every 4 (four) hours as needed for cough. 10/25/23  Yes Rai, Ripudeep K, MD  HUMALOG KWIKPEN 100 UNIT/ML KwikPen Inject 2-15 Units into the skin. 121-150 2 units 151-200 3 units  201-250 5 units  251-300 8 units  301-350 11 units  351-400 15 units 12/11/23  Yes [provider]  HYDROcodone -acetaminophen  (NORCO/VICODIN) 5-325 MG tablet Take 1 tablet by mouth every 6 (six) hours as needed for severe pain (pain score 7-10). 12/08/23  Yes Elgergawy, Ardia Kraft, MD  INCRUSE ELLIPTA  62.5 MCG/ACT AEPB Inhale 1 puff into the lungs daily. 12/11/23  Yes [provider]  Lacosamide  150 MG TABS Take 1 tablet (150 mg total) by mouth 2 (two) times daily. 10/10/23  Yes Verlyn Goad, MD  levETIRAcetam  (KEPPRA ) 500 MG tablet Take 1 tablet (500 mg total) by mouth 2 (two) times daily. 05/04/23  Yes Cassandra Cleveland, MD  loperamide  (IMODIUM ) 2 MG capsule Take 2 mg by mouth daily as needed for diarrhea or loose stools.   Yes [provider]  methocarbamol  (ROBAXIN ) 500 MG tablet Take 500 mg by mouth every 8 (eight) hours as needed for muscle spasms.   Yes [provider]  metolazone  (ZAROXOLYN ) 2.5 MG tablet Take 2.5 mg by mouth once a week. Every Tuesday. 09/20/23  Yes [provider]  midodrine  (PROAMATINE ) 5 MG tablet Take 1 tablet (5 mg total) by mouth 3 (three) times daily with meals. 12/08/23  Yes Elgergawy, Ardia Kraft, MD  mirtazapine  (REMERON ) 15 MG tablet Take 15 mg by mouth at bedtime. 07/14/23  Yes [provider]  omega-3 acid ethyl esters (LOVAZA) 1 g capsule Take 2 g by mouth 2 (two) times daily.   Yes [provider]  omeprazole (PRILOSEC) 20 MG capsule Take 20 mg by mouth daily. 05/29/20  Yes [provider]  polyethylene glycol powder (GLYCOLAX /MIRALAX ) 17 GM/SCOOP powder Mix in water as directed and take 1 capful (17 g) by mouth daily. 07/12/23  Yes Love, Renay Carota, PA-C  potassium chloride  SA (KLOR-CON  M20) 20 MEQ tablet Take 1-2 tablets (20-40 mEq total) by mouth See admin instructions. Take 2 tablets in the morning and 1 tablet in the evening Patient taking differently: Take 20-40 mEq by mouth See admin instructions. Take 40 meq in the morning and 20 meq in the evening. 10/10/23  Yes Verlyn Goad, MD  prazosin  (MINIPRESS ) 2 MG capsule Take 2 mg by mouth at bedtime. 08/17/23  Yes [provider]  pregabalin  (LYRICA ) 150 MG capsule Take 1 capsule (150 mg total) by mouth 3 (three) times daily. 10/10/23  Yes Verlyn Goad, MD  rOPINIRole  (REQUIP ) 0.25 MG tablet Take 1 tablet (0.25 mg total) by mouth 2 (two) times daily. 07/25/23  Yes Elgergawy, Ardia Kraft, MD  sennosides-docusate sodium  (SENOKOT-S) 8.6-50 MG tablet Take 2 tablets by mouth in the morning and at bedtime.   Yes [provider]  spironolactone  (ALDACTONE ) 25 MG tablet Take 12.5 mg by mouth daily. 09/01/23  Yes [provider]  tirzepatide  (MOUNJARO ) 15 MG/0.5ML Pen INJECT 15 MG INTO  THE SKIN ONCE A WEEK. Patient taking differently: Inject 15 mg into the skin once a week. Mondays 06/07/23  Yes Motwani, Komal, MD  torsemide  (DEMADEX ) 20 MG tablet Take 1 tablet (20 mg total) by mouth 2 (two) times daily. 12/08/23  Yes Elgergawy, Ardia Kraft, MD  traZODone  (DESYREL ) 100 MG tablet TAKE 2 TABLETS BY MOUTH AT BEDTIME 11/09/23  Yes Mozingo, Regina Nattalie, NP  venlafaxine  XR (EFFEXOR -XR) 150 MG 24 hr capsule Take 150 mg by mouth daily with breakfast.   Yes [provider]  insulin  aspart (NOVOLOG ) 100 UNIT/ML injection Inject 0-15 Units into the skin 3 (three) times daily with meals. Sliding scale  CBG 70 - 120: 0 units: CBG 121 - 150: 2 units; CBG 151 - 200: 3 units; CBG 201 - 250: 5 units; CBG 251 - 300: 8 units;CBG 301 - 350: 11 units; CBG 351 - 400: 15 units; CBG > 400 : 15 units and notify MD Patient not taking: Reported on 12/15/2023 10/25/23   Rai, Hurman Maiden, MD  linezolid  (ZYVOX ) 600 MG tablet Take 600 mg by mouth 2 (two) times daily. Patient not taking: Reported on 12/15/2023    [provider]  metoprolol  succinate (TOPROL -XL) 50 MG 24 hr tablet Take 50 mg by mouth daily. Patient not taking: Reported on 12/15/2023 12/11/23   [provider]    Physical Exam: Vitals:   12/15/23 1045 12/15/23 1115 12/15/23 1517 12/15/23 1811  BP: (!) 114/53 125/68  128/84  Pulse: (!) 57 99 85 99  Resp: 20 (!) 21 (!) 21 20  Temp:   98 F (36.7 C)   TempSrc:      SpO2: 97% 94% 92% 98%  Weight:      Height:        Constitutional: NAD, calm, comfortable Vitals:   12/15/23 1045 12/15/23 1115 12/15/23 1517 12/15/23 1811  BP: (!) 114/53 125/68  128/84  Pulse: (!) 57 99 85 99  Resp: 20 (!) 21 (!) 21 20  Temp:  98 F (36.7 C)   TempSrc:      SpO2: 97% 94% 92% 98%  Weight:      Height:       Eyes: PERRL, lids and conjunctivae normal ENMT: Mucous membranes are moist. Posterior pharynx clear of any exudate or lesions.Normal dentition.  Neck: normal, supple, no masses,  no thyromegaly Respiratory: clear to auscultation bilaterally anterior lung fields, no wheezing, no crackles. Normal respiratory effort. No accessory muscle use.  Cardiovascular: Regular rate and rhythm, no murmurs / rubs / gallops. No extremity edema. 2+ pedal pulses. No carotid bruits.  Abdomen: no tenderness, no masses palpated. No hepatosplenomegaly. Bowel sounds positive.  Musculoskeletal: no clubbing / cyanosis. No joint deformity upper and lower extremities. Good ROM, no contractures. Normal muscle tone.  Skin: Chronic venous stasis changes noted in lower extremities.  No rashes, lesions, ulcers. No induration Neurologic: CN 2-12 grossly intact. Sensation intact, 4/5 bilateral upper extremity strength.  2/4 bilateral lower extremity strength.  Gait not tested secondary to safety.  Patient also noted to be more wheelchair-bound. Psychiatric: Normal judgment and insight. Alert and oriented x 3. Normal mood.   Labs on Admission: I have personally reviewed following labs and imaging studies  CBC: Recent Labs  Lab 12/15/23 0940 12/15/23 0943  WBC 13.6*  --   NEUTROABS 11.0*  --   HGB 13.9 14.6  HCT 40.7 43.0  MCV 88.7  --   PLT 207  --     Basic Metabolic Panel: Recent Labs  Lab 12/15/23 0940 12/15/23 0943  NA 133* 133*  K 3.8 3.7  CL 95* 94*  CO2 26  --   GLUCOSE 249* 242*  BUN 21 24*  CREATININE 1.26* 1.20  CALCIUM  8.9  --     GFR: Estimated Creatinine Clearance: 95.3 mL/min (by C-G formula based on SCr of 1.2 mg/dL).  Liver Function Tests: Recent Labs  Lab 12/15/23 0940  AST 16  ALT 22  ALKPHOS 56  BILITOT 0.9  PROT 6.7  ALBUMIN 3.4*    Urine analysis:    Component Value Date/Time   COLORURINE YELLOW 11/27/2023 0937   APPEARANCEUR CLEAR 11/27/2023 0937   LABSPEC 1.009 11/27/2023 0937   PHURINE 7.0 11/27/2023 0937   GLUCOSEU 50 (A) 11/27/2023 0937   HGBUR NEGATIVE 11/27/2023 0937   BILIRUBINUR NEGATIVE 11/27/2023 0937   KETONESUR NEGATIVE 11/27/2023  0937   PROTEINUR NEGATIVE 11/27/2023 0937   NITRITE NEGATIVE 11/27/2023 0937   LEUKOCYTESUR NEGATIVE 11/27/2023 0937    Radiological Exams on Admission: DG Chest Portable 1 View Result Date: 12/15/2023 CLINICAL DATA:  Cough EXAM: PORTABLE CHEST 1 VIEW COMPARISON:  Chest radiograph 11/28/2023 FINDINGS: Cardiomegaly. Monitoring leads overlie the patient. No large area pulmonary consolidation. No pleural effusion or pneumothorax. Mild pulmonary vascular redistribution. IMPRESSION: Cardiomegaly with mild pulmonary vascular redistribution. Electronically Signed   By: Jone Neither M.D.   On: 12/15/2023 16:16   CT HEAD CODE STROKE WO CONTRAST Result Date: 12/15/2023 CLINICAL DATA:  Code stroke. Neuro deficit, acute, stroke suspected. Left-sided facial droop. EXAM: CT HEAD WITHOUT CONTRAST TECHNIQUE: Contiguous axial images were obtained from the base of the skull through the vertex without intravenous contrast. RADIATION DOSE REDUCTION: This exam was performed according to the departmental dose-optimization program which includes automated exposure control, adjustment of the mA and/or kV according to patient size and/or use of iterative reconstruction technique. COMPARISON:  Prior head CT examinations 11/27/2023 and earlier. Brain MRI 07/14/2023. FINDINGS: Brain: Mild generalized parenchymal atrophy. There is no acute intracranial  hemorrhage. No demarcated cortical infarct. No extra-axial fluid collection. No evidence of an intracranial mass. No midline shift. Vascular: No hyperdense vessel.  Atherosclerotic calcifications. Skull: No calvarial fracture or aggressive osseous lesion. Sinuses/Orbits: No mass or acute finding within the imaged orbits. Minimal mucosal thickening within the bilateral maxillary sinuses. Minimal mucosal thickening, and small-volume secretions, within the right sphenoid sinus. Mild mucosal thickening within the bilateral ethmoid sinuses. Minimal mucosal thickening within the frontal  sinuses. ASPECTS [facility] Stroke Program Early CT Score) - Ganglionic level infarction (caudate, lentiform nuclei, internal capsule, insula, M1-M3 cortex): 7 - Supraganglionic infarction (M4-M6 cortex): 3 Total score (0-10 with 10 being normal): 10 No evidence of an acute intracranial abnormality. These results were communicated to Dr. Murvin Arthurs At 9:54 amon 5/9/2025by text page via the [facility] messaging system. IMPRESSION: 1. No evidence of an acute intracranial abnormality. 2. Mild generalized parenchymal atrophy. 3. Paranasal sinus disease as described. Electronically Signed   By: Bascom Lily D.O.   On: 12/15/2023 09:54    EKG: Independently reviewed.  Sinus tachycardia with occasional PVCs.  Assessment/Plan Principal Problem:   Acute left-sided weakness Active Problems:   Type 2 diabetes mellitus with obesity (HCC)   COPD (chronic obstructive pulmonary disease) (HCC)   AF (paroxysmal atrial fibrillation) (HCC)   Chronic diastolic CHF (congestive heart failure) (HCC)   S/P TAVR (transcatheter aortic valve replacement)   Seizure disorder (HCC)   Class 3 obesity   OSA (obstructive sleep apnea)   Obesity, Class III, BMI 40-49.9 (morbid obesity)   PTSD (post-traumatic stress disorder)   BPH without obstruction/lower urinary tract symptoms   Peripheral neuropathy   Physical deconditioning   Pure hypercholesterolemia   Chronic venous stasis   Diabetic neuropathy (HCC)   DM2 (diabetes mellitus, type 2) (HCC)   Primary hypertension   Coronary artery disease due to lipid rich plaque   Stroke-like symptoms   Depression   Stage 3a chronic kidney disease (HCC)   MDD (major depressive disorder), recurrent severe, without psychosis (HCC)   #1 left-sided weakness -Patient with prior history of CVA with some residual left-sided weakness presenting with sudden onset worsening left-sided weakness from baseline, slurred speech, concern for possible facial droop. - Patient brought in as a code  stroke. - CT head done negative for any acute abnormalities. -Chest x-ray done negative for any acute abnormalities. -Urinalysis pending. - Patient seen in consultation by neurology who recommended MRI of the brain without contrast and if negative no further workup needed. -Continue home regimen Lipitor, Tricor , Lovaza, Eliquis . - Patient noted with spinal cord stimulator and wife will be able to bring her remote in tomorrow to turn spinal cord stimulator off prior to getting MRI done.  2.  A-fib -Resume home regimen Toprol -XL. -Eliquis  for anticoagulation.  3.  Diabetes mellitus type 2 -Hemoglobin A1c noted at 6.6 on 10/04/2023. - Place on SSI.  4.  Depression//anxiety -Resume home regimen Abilify , BuSpar , Effexor .  5.  Chronic diastolic heart failure/CAD/status post TAVR - Currently stable. - Resume home cardiac regimen of Lipitor, spironolactone , Toprol -XL, Tricor , Lovaza, Zaroxolyn , Demadex .  6.  CKD stage IIIa -Stable.  7.  Seizure disorder -Stable. - Resume home regimen Keppra , lacosamide .  8.  COPD -Resume home regimen Incruse Ellipta . - Dulera.  9.  Obesity class III -Lifestyle modification - Outpatient follow-up with PCP.  10.  OSA -CPAP nightly.   DVT prophylaxis: Eliquis  Code Status:   DNR Family Communication:  Updated patient.  No family at bedside. Disposition Plan:   Patient  is from:  Nursing home  Anticipated DC to:  Nursing home  Anticipated DC date:  TBD  Anticipated DC barriers: None  Consults called:  Neurology Admission status:  Place in observation  Severity of Illness: The appropriate patient status for this patient is OBSERVATION. Observation status is judged to be reasonable and necessary in order to provide the required intensity of service to ensure the patient's safety. The patient's presenting symptoms, physical exam findings, and initial radiographic and laboratory data in the context of their medical condition is felt to place them  at decreased risk for further clinical deterioration. Furthermore, it is anticipated that the patient will be medically stable for discharge from the hospital within 2 midnights of admission.     Hilda Lovings MD [facility]  How to contact the [facility] Attending or Consulting provider 7A - 7P or covering provider during after hours 7P -7A, for this patient?   Check the care team in [facility] and look for a) attending/consulting [facility] provider listed and b) the [facility] team listed Log into [email or web address] and use [place]'s universal password to access. If you do not have the password, please contact the hospital operator. Locate the [facility] provider you are looking for under [facility] and page to a number that you can be directly reached. If you still have difficulty reaching the provider, please page the [facility] (Director on Call) for the Hospitalists listed on amion for assistance.  12/15/2023, 6:14 PM

## 2023-12-16 ENCOUNTER — Observation Stay (HOSPITAL_COMMUNITY)

## 2023-12-16 DIAGNOSIS — N400 Enlarged prostate without lower urinary tract symptoms: Secondary | ICD-10-CM | POA: Diagnosis present

## 2023-12-16 DIAGNOSIS — E785 Hyperlipidemia, unspecified: Secondary | ICD-10-CM | POA: Diagnosis not present

## 2023-12-16 DIAGNOSIS — R569 Unspecified convulsions: Secondary | ICD-10-CM

## 2023-12-16 DIAGNOSIS — N3000 Acute cystitis without hematuria: Secondary | ICD-10-CM

## 2023-12-16 DIAGNOSIS — G928 Other toxic encephalopathy: Secondary | ICD-10-CM | POA: Diagnosis present

## 2023-12-16 DIAGNOSIS — B961 Klebsiella pneumoniae [K. pneumoniae] as the cause of diseases classified elsewhere: Secondary | ICD-10-CM | POA: Diagnosis present

## 2023-12-16 DIAGNOSIS — G4733 Obstructive sleep apnea (adult) (pediatric): Secondary | ICD-10-CM | POA: Diagnosis not present

## 2023-12-16 DIAGNOSIS — N390 Urinary tract infection, site not specified: Secondary | ICD-10-CM | POA: Diagnosis present

## 2023-12-16 DIAGNOSIS — I6523 Occlusion and stenosis of bilateral carotid arteries: Secondary | ICD-10-CM | POA: Diagnosis not present

## 2023-12-16 DIAGNOSIS — E7800 Pure hypercholesterolemia, unspecified: Secondary | ICD-10-CM | POA: Diagnosis present

## 2023-12-16 DIAGNOSIS — Z953 Presence of xenogenic heart valve: Secondary | ICD-10-CM | POA: Diagnosis not present

## 2023-12-16 DIAGNOSIS — I10 Essential (primary) hypertension: Secondary | ICD-10-CM | POA: Diagnosis not present

## 2023-12-16 DIAGNOSIS — Z8669 Personal history of other diseases of the nervous system and sense organs: Secondary | ICD-10-CM | POA: Diagnosis not present

## 2023-12-16 DIAGNOSIS — I878 Other specified disorders of veins: Secondary | ICD-10-CM | POA: Diagnosis not present

## 2023-12-16 DIAGNOSIS — Z7985 Long-term (current) use of injectable non-insulin antidiabetic drugs: Secondary | ICD-10-CM

## 2023-12-16 DIAGNOSIS — I130 Hypertensive heart and chronic kidney disease with heart failure and stage 1 through stage 4 chronic kidney disease, or unspecified chronic kidney disease: Secondary | ICD-10-CM | POA: Diagnosis present

## 2023-12-16 DIAGNOSIS — I110 Hypertensive heart disease with heart failure: Secondary | ICD-10-CM

## 2023-12-16 DIAGNOSIS — Z794 Long term (current) use of insulin: Secondary | ICD-10-CM | POA: Diagnosis not present

## 2023-12-16 DIAGNOSIS — Z66 Do not resuscitate: Secondary | ICD-10-CM | POA: Diagnosis present

## 2023-12-16 DIAGNOSIS — F332 Major depressive disorder, recurrent severe without psychotic features: Secondary | ICD-10-CM | POA: Diagnosis present

## 2023-12-16 DIAGNOSIS — I69354 Hemiplegia and hemiparesis following cerebral infarction affecting left non-dominant side: Secondary | ICD-10-CM | POA: Diagnosis not present

## 2023-12-16 DIAGNOSIS — J449 Chronic obstructive pulmonary disease, unspecified: Secondary | ICD-10-CM | POA: Diagnosis not present

## 2023-12-16 DIAGNOSIS — I4891 Unspecified atrial fibrillation: Secondary | ICD-10-CM | POA: Diagnosis not present

## 2023-12-16 DIAGNOSIS — I700 Atherosclerosis of aorta: Secondary | ICD-10-CM | POA: Diagnosis not present

## 2023-12-16 DIAGNOSIS — Z952 Presence of prosthetic heart valve: Secondary | ICD-10-CM | POA: Diagnosis not present

## 2023-12-16 DIAGNOSIS — J4489 Other specified chronic obstructive pulmonary disease: Secondary | ICD-10-CM | POA: Diagnosis present

## 2023-12-16 DIAGNOSIS — I5032 Chronic diastolic (congestive) heart failure: Secondary | ICD-10-CM | POA: Diagnosis not present

## 2023-12-16 DIAGNOSIS — Z1611 Resistance to penicillins: Secondary | ICD-10-CM | POA: Diagnosis present

## 2023-12-16 DIAGNOSIS — G40909 Epilepsy, unspecified, not intractable, without status epilepticus: Secondary | ICD-10-CM | POA: Diagnosis present

## 2023-12-16 DIAGNOSIS — R531 Weakness: Secondary | ICD-10-CM | POA: Diagnosis not present

## 2023-12-16 DIAGNOSIS — R410 Disorientation, unspecified: Secondary | ICD-10-CM

## 2023-12-16 DIAGNOSIS — E119 Type 2 diabetes mellitus without complications: Secondary | ICD-10-CM | POA: Diagnosis not present

## 2023-12-16 DIAGNOSIS — N1831 Chronic kidney disease, stage 3a: Secondary | ICD-10-CM | POA: Diagnosis not present

## 2023-12-16 DIAGNOSIS — Z7901 Long term (current) use of anticoagulants: Secondary | ICD-10-CM | POA: Diagnosis not present

## 2023-12-16 DIAGNOSIS — E1122 Type 2 diabetes mellitus with diabetic chronic kidney disease: Secondary | ICD-10-CM | POA: Diagnosis present

## 2023-12-16 DIAGNOSIS — I2510 Atherosclerotic heart disease of native coronary artery without angina pectoris: Secondary | ICD-10-CM | POA: Diagnosis not present

## 2023-12-16 DIAGNOSIS — E1140 Type 2 diabetes mellitus with diabetic neuropathy, unspecified: Secondary | ICD-10-CM | POA: Diagnosis present

## 2023-12-16 DIAGNOSIS — I480 Paroxysmal atrial fibrillation: Secondary | ICD-10-CM | POA: Diagnosis present

## 2023-12-16 DIAGNOSIS — G9389 Other specified disorders of brain: Secondary | ICD-10-CM | POA: Diagnosis present

## 2023-12-16 DIAGNOSIS — E1169 Type 2 diabetes mellitus with other specified complication: Secondary | ICD-10-CM | POA: Diagnosis not present

## 2023-12-16 DIAGNOSIS — I69352 Hemiplegia and hemiparesis following cerebral infarction affecting left dominant side: Secondary | ICD-10-CM | POA: Diagnosis not present

## 2023-12-16 DIAGNOSIS — E66813 Obesity, class 3: Secondary | ICD-10-CM | POA: Diagnosis present

## 2023-12-16 DIAGNOSIS — Z8616 Personal history of COVID-19: Secondary | ICD-10-CM | POA: Diagnosis not present

## 2023-12-16 DIAGNOSIS — I509 Heart failure, unspecified: Secondary | ICD-10-CM

## 2023-12-16 DIAGNOSIS — R400 Somnolence: Secondary | ICD-10-CM | POA: Diagnosis not present

## 2023-12-16 DIAGNOSIS — Z9682 Presence of neurostimulator: Secondary | ICD-10-CM | POA: Diagnosis not present

## 2023-12-16 LAB — BASIC METABOLIC PANEL WITH GFR
Anion gap: 14 (ref 5–15)
BUN: 18 mg/dL (ref 8–23)
CO2: 26 mmol/L (ref 22–32)
Calcium: 8.8 mg/dL — ABNORMAL LOW (ref 8.9–10.3)
Chloride: 97 mmol/L — ABNORMAL LOW (ref 98–111)
Creatinine, Ser: 1.13 mg/dL (ref 0.61–1.24)
GFR, Estimated: >60 mL/min (ref >60)
Glucose, Bld: 214 mg/dL — ABNORMAL HIGH (ref 70–99)
Potassium: 3.7 mmol/L (ref 3.5–5.1)
Sodium: 137 mmol/L (ref 135–145)

## 2023-12-16 LAB — GLUCOSE, CAPILLARY
Glucose-Capillary: 181 mg/dL — ABNORMAL HIGH (ref 70–99)
Glucose-Capillary: 198 mg/dL — ABNORMAL HIGH (ref 70–99)
Glucose-Capillary: 154 mg/dL — ABNORMAL HIGH (ref 70–99)
Glucose-Capillary: 155 mg/dL — ABNORMAL HIGH (ref 70–99)

## 2023-12-16 LAB — MAGNESIUM: Magnesium: 1.9 mg/dL (ref 1.7–2.4)

## 2023-12-16 LAB — CBC
HCT: 41.3 % (ref 39.0–52.0)
Hemoglobin: 13.5 g/dL (ref 13.0–17.0)
MCH: 29.6 pg (ref 26.0–34.0)
MCHC: 32.7 g/dL (ref 30.0–36.0)
MCV: 90.6 fL (ref 80.0–100.0)
Platelets: 199 10*3/uL (ref 150–400)
RBC: 4.56 MIL/uL (ref 4.22–5.81)
RDW: 13.2 % (ref 11.5–15.5)
WBC: 9.3 10*3/uL (ref 4.0–10.5)
nRBC: 0 % (ref 0.0–0.2)

## 2023-12-16 LAB — PHOSPHORUS: Phosphorus: 3 mg/dL (ref 2.5–4.6)

## 2023-12-16 MED ORDER — MIRTAZAPINE 15 MG PO TABS
7.5000 mg | ORAL_TABLET | Freq: Every day | ORAL | Status: DC
  Administered 2023-12-16: 7.5 mg via ORAL
  Filled 2023-12-16: qty 1

## 2023-12-16 MED ORDER — SODIUM CHLORIDE 0.9 % IV SOLN
2.0000 g | INTRAVENOUS | Status: DC
  Administered 2023-12-16 – 2023-12-18 (×3): 2 g via INTRAVENOUS
  Filled 2023-12-16 (×4): qty 20

## 2023-12-16 MED ORDER — TRAZODONE HCL 50 MG PO TABS
150.0000 mg | ORAL_TABLET | Freq: Every day | ORAL | Status: DC
  Administered 2023-12-16 – 2023-12-19 (×4): 150 mg via ORAL
  Filled 2023-12-16 (×4): qty 1

## 2023-12-16 MED ORDER — BUSPIRONE HCL 10 MG PO TABS
5.0000 mg | ORAL_TABLET | Freq: Three times a day (TID) | ORAL | Status: DC
  Administered 2023-12-16 – 2023-12-20 (×12): 5 mg via ORAL
  Filled 2023-12-16 (×12): qty 1

## 2023-12-16 MED ORDER — ATORVASTATIN CALCIUM 80 MG PO TABS
80.0000 mg | ORAL_TABLET | Freq: Every day | ORAL | Status: DC
  Administered 2023-12-16 – 2023-12-19 (×4): 80 mg via ORAL
  Filled 2023-12-16 (×4): qty 1

## 2023-12-16 NOTE — Progress Notes (Signed)
 PROGRESS NOTE    Lawrence [name]  ZOX:[id number] DOB: April 26, 1956 DOA: 12/15/2023 PCP: Darnelle Elders, PA-C    Chief Complaint  Patient presents with   Code Stroke    Brief Narrative:  Patient 68 year old gentleman history of COPD, severe aortic stenosis status post TAVR July 2024, seizure disorder, chronic diastolic CHF, A-fib on Eliquis , CVA, gout, OSA on CPAP, anxiety, BPH, chronic venous stasis, morbid obesity presenting with worsening left-sided weakness from his baseline, slurred speech concern for acute CVA.  CT head done unremarkable.  Patient seen in consultation by neurology who recommended MRI of the brain.  Patient however with a spinal cord stimulator, wife trying to locate remote and as such MRI unable to to be done yet.  Urinalysis concerning for UTI patient placed on IV antibiotics.   Assessment & Plan:   Principal Problem:   Acute left-sided weakness Active Problems:   Type 2 diabetes mellitus with obesity (HCC)   COPD (chronic obstructive pulmonary disease) (HCC)   AF (paroxysmal atrial fibrillation) (HCC)   Chronic diastolic CHF (congestive heart failure) (HCC)   S/P TAVR (transcatheter aortic valve replacement)   Seizure disorder (HCC)   Class 3 obesity   OSA (obstructive sleep apnea)   Obesity, Class III, BMI 40-49.9 (morbid obesity)   PTSD (post-traumatic stress disorder)   BPH without obstruction/lower urinary tract symptoms   Peripheral neuropathy   Physical deconditioning   Pure hypercholesterolemia   Chronic venous stasis   Diabetic neuropathy (HCC)   DM2 (diabetes mellitus, type 2) (HCC)   Primary hypertension   Coronary artery disease due to lipid rich plaque   Stroke-like symptoms   Depression   Stage 3a chronic kidney disease (HCC)   MDD (major depressive disorder), recurrent severe, without psychosis (HCC)   UTI (urinary tract infection)  #1 left-sided weakness: Acute ischemic CVA versus recrudescence of prior CVA in the setting of  UTI -Patient with prior history of CVA with some residual left-sided weakness presenting with sudden onset worsening left-sided weakness from baseline, slurred speech, concern for possible facial droop. - Patient brought in as a code stroke. - CT head done negative for any acute abnormalities. -Chest x-ray done negative for any acute abnormalities. -Urinalysis pending. - Patient seen in consultation by neurology who recommended MRI of the brain without contrast and if negative no further workup needed. -Patient with spinal cord stimulator however wife unable to locate remote. -Patient seen by neurology and recommending MRI to be done which will likely be done on Monday when [facility] representative is able to bring in remote for nerve stimulator per neurology. -EEG done with mild diffuse slowing indicative of global cerebral dysfunction.  Epileptiform abnormalities not seen. -Continue home regimen Lipitor, Tricor , Lovaza, Eliquis . -PT/OT. -Likely needs SNF. -Neurology following and I appreciate their input and recommendations.   2.  A-fib - Currently rate controlled.  - Patient noted not on rate limiting medications prior to admission.  - Continue Eliquis  for anticoagulation.  3.  UTI -Urine cultures pending.   - IV Rocephin .    4.  Diabetes mellitus type 2 -Hemoglobin A1c noted at 6.6 on 10/04/2023. - CBG noted at 181 this morning.   - SSI.     5.  Depression//anxiety - Continue home regimen Abilify , Effexor . -Decrease BuSpar  to 5 mg 3 times daily as per wife and RN patient with some bouts of sleepiness intermittently.   6.  Chronic diastolic heart failure/CAD/status post TAVR - Currently stable. -Euvolemic on examination. - Continue home cardiac  regimen of Lipitor, spironolactone , Tricor , Lovaza, Zaroxolyn , Demadex .   7.  CKD stage IIIa -Stable.   8.  Seizure disorder -Stable. - EEG obtained negative for any epileptiform discharges noted.  -Continue home regimen  Keppra , lacosamide .   9.  COPD - Continue home regimen Incruse Ellipta . - Dulera.   10.  Obesity class III -Lifestyle modification - Outpatient follow-up with PCP.   11.  OSA -CPAP nightly.     DVT prophylaxis: Eliquis  Code Status: DNR Family Communication: Updated patient and wife at bedside. Disposition: Likely back to nursing home.  Status is: Inpatient The patient will require care spanning > 2 midnights and should be moved to inpatient because: Severity of illness   Consultants:  Neurology: Dr. Murvin Arthurs 12/15/2023  Procedures:  CT head 12/15/2023 Chest x-ray 12/15/2023 EEG  Antimicrobials:  Anti-infectives (From admission, onward)    Start     Dose/Rate Route Frequency Ordered Stop   12/16/23 0900  cefTRIAXone  (ROCEPHIN ) 2 g in sodium chloride  0.9 % 100 mL IVPB        2 g 200 mL/hr over 30 Minutes Intravenous Every 24 hours 12/16/23 0831     12/15/23 1515  linezolid  (ZYVOX ) tablet 600 mg  Status:  Discontinued        600 mg Oral 2 times daily 12/15/23 1508 12/15/23 1519         Subjective: Patient lying in bed, dog in lap.  Patient denies any chest pain, no shortness of breath.  Patient states left-sided weakness has improved since admission.  No further slurred or dysarthric speech noted.  Wife at bedside.  Wife states unable to find remote for nerve stimulator.  Objective: Vitals:   12/16/23 0037 12/16/23 0442 12/16/23 0850 12/16/23 1104  BP:  119/62 (!) 142/73 (!) 151/80  Pulse: 89  88 (!) 102  Resp:  20  20  Temp:  98.3 F (36.8 C) 98.6 F (37 C) 98 F (36.7 C)  TempSrc:  Oral Oral Oral  SpO2:  90% 95% 95%  Weight:      Height:        Intake/Output Summary (Last 24 hours) at 12/16/2023 1159 Last data filed at 12/16/2023 0700 Gross per 24 hour  Intake 961.25 ml  Output 1125 ml  Net -163.75 ml   Filed Weights   12/15/23 1018  Weight: (!) 155 kg    Examination:  General exam: Appears calm and comfortable  Respiratory system: Clear to  auscultation anterior lung fields. Respiratory effort normal. Cardiovascular system: S1 & S2 heard, RRR. No JVD, murmurs, rubs, gallops or clicks. No pedal edema. Gastrointestinal system: Abdomen is nondistended, soft and nontender. No organomegaly or masses felt. Normal bowel sounds heard. Central nervous system: Alert and oriented.  Moving extremities spontaneously.  No focal neurological deficits. Extremities: Symmetric 5 x 5 power. Skin: No rashes, lesions or ulcers Psychiatry: Judgement and insight appear normal. Mood & affect appropriate.     Data Reviewed: I have personally reviewed following labs and imaging studies  CBC: Recent Labs  Lab 12/15/23 0940 12/15/23 0943 12/16/23 0737  WBC 13.6*  --  9.3  NEUTROABS 11.0*  --   --   HGB 13.9 14.6 13.5  HCT 40.7 43.0 41.3  MCV 88.7  --  90.6  PLT 207  --  199    Basic Metabolic Panel: Recent Labs  Lab 12/15/23 0940 12/15/23 0943 12/16/23 0737  NA 133* 133* 137  K 3.8 3.7 3.7  CL 95* 94* 97*  CO2 26  --  26  GLUCOSE 249* 242* 214*  BUN 21 24* 18  CREATININE 1.26* 1.20 1.13  CALCIUM  8.9  --  8.8*  MG  --   --  1.9  PHOS  --   --  3.0    GFR: Estimated Creatinine Clearance: 101.2 mL/min (by C-G formula based on SCr of 1.13 mg/dL).  Liver Function Tests: Recent Labs  Lab 12/15/23 0940  AST 16  ALT 22  ALKPHOS 56  BILITOT 0.9  PROT 6.7  ALBUMIN 3.4*    CBG: Recent Labs  Lab 12/15/23 0937 12/15/23 2149 12/16/23 0625 12/16/23 1103  GLUCAP 257* 212* 181* 198*     No results found for this or any previous visit (from the past 240 hours).       Radiology Studies: DG Chest Portable 1 View Result Date: 12/15/2023 CLINICAL DATA:  Cough EXAM: PORTABLE CHEST 1 VIEW COMPARISON:  Chest radiograph 11/28/2023 FINDINGS: Cardiomegaly. Monitoring leads overlie the patient. No large area pulmonary consolidation. No pleural effusion or pneumothorax. Mild pulmonary vascular redistribution. IMPRESSION: Cardiomegaly  with mild pulmonary vascular redistribution. Electronically Signed   By: Jone Neither M.D.   On: 12/15/2023 16:16   CT HEAD CODE STROKE WO CONTRAST Result Date: 12/15/2023 CLINICAL DATA:  Code stroke. Neuro deficit, acute, stroke suspected. Left-sided facial droop. EXAM: CT HEAD WITHOUT CONTRAST TECHNIQUE: Contiguous axial images were obtained from the base of the skull through the vertex without intravenous contrast. RADIATION DOSE REDUCTION: This exam was performed according to the departmental dose-optimization program which includes automated exposure control, adjustment of the mA and/or kV according to patient size and/or use of iterative reconstruction technique. COMPARISON:  Prior head CT examinations 11/27/2023 and earlier. Brain MRI 07/14/2023. FINDINGS: Brain: Mild generalized parenchymal atrophy. There is no acute intracranial hemorrhage. No demarcated cortical infarct. No extra-axial fluid collection. No evidence of an intracranial mass. No midline shift. Vascular: No hyperdense vessel.  Atherosclerotic calcifications. Skull: No calvarial fracture or aggressive osseous lesion. Sinuses/Orbits: No mass or acute finding within the imaged orbits. Minimal mucosal thickening within the bilateral maxillary sinuses. Minimal mucosal thickening, and small-volume secretions, within the right sphenoid sinus. Mild mucosal thickening within the bilateral ethmoid sinuses. Minimal mucosal thickening within the frontal sinuses. ASPECTS [facility] Stroke Program Early CT Score) - Ganglionic level infarction (caudate, lentiform nuclei, internal capsule, insula, M1-M3 cortex): 7 - Supraganglionic infarction (M4-M6 cortex): 3 Total score (0-10 with 10 being normal): 10 No evidence of an acute intracranial abnormality. These results were communicated to Dr. Murvin Arthurs At 9:54 amon 5/9/2025by text page via the [employer] messaging system. IMPRESSION: 1. No evidence of an acute intracranial abnormality. 2. Mild generalized  parenchymal atrophy. 3. Paranasal sinus disease as described. Electronically Signed   By: Bascom Lily D.O.   On: 12/15/2023 09:54        Scheduled Meds:  allopurinol   100 mg Oral Daily   apixaban   5 mg Oral BID   ARIPiprazole   5 mg Oral Daily   atorvastatin   40 mg Oral QHS   busPIRone   10 mg Oral TID   colchicine   0.6 mg Oral Daily   cyanocobalamin   1,000 mcg Oral Daily   fenofibrate   54 mg Oral Daily   ferrous sulfate   325 mg Oral Daily   fluticasone   2 spray Each Nare Daily   fluticasone  furoate-vilanterol  1 puff Inhalation Daily   insulin  aspart  0-9 Units Subcutaneous TID WC   lacosamide   150 mg Oral BID   levETIRAcetam   500 mg Oral BID   [  START ON 12/19/2023] metolazone   2.5 mg Oral Weekly   midodrine   5 mg Oral TID WC   mirtazapine   15 mg Oral QHS   omega-3 acid ethyl esters  2 g Oral BID   pantoprazole   40 mg Oral Daily   polyethylene glycol  17 g Oral Daily   potassium chloride   40 mEq Oral q AM   And   potassium chloride   20 mEq Oral QPM   prazosin   2 mg Oral QHS   pregabalin   150 mg Oral TID   rOPINIRole   0.25 mg Oral BID   senna-docusate  2 tablet Oral BID   sodium chloride  flush  3 mL Intravenous Once   sodium chloride  flush  3 mL Intravenous Q12H   spironolactone   12.5 mg Oral Daily   torsemide   20 mg Oral BID   traZODone   200 mg Oral QHS   umeclidinium bromide   1 puff Inhalation Daily   venlafaxine  XR  150 mg Oral Q breakfast   Continuous Infusions:  sodium chloride  75 mL/hr at 12/16/23 1012   cefTRIAXone  (ROCEPHIN )  IV 2 g (12/16/23 0919)     LOS: 0 days    Time spent: 40 minutes    Hilda Lovings, MD [facility]   To contact the attending provider between 7A-7P or the covering provider during after hours 7P-7A, please log into the web site [email or web address] and access using universal [place] password for that web site. If you do not have the password, please call the hospital operator.  12/16/2023, 11:59 AM

## 2023-12-16 NOTE — Procedures (Signed)
 Routine EEG Report  Lawrence [name] is a 68 y.o. male with a history of seizure who is undergoing an EEG to evaluate for seizures.  Report: This EEG was acquired with electrodes placed according to the International 10-20 electrode system (including Fp1, Fp2, F3, F4, C3, C4, P3, P4, O1, O2, T3, T4, T5, T6, A1, A2, Fz, Cz, Pz). The following electrodes were missing or displaced: none.  The occipital dominant rhythm was 6-7 Hz. This activity is reactive to stimulation. Drowsiness was manifested by background fragmentation; deeper stages of sleep were identified by K complexes and sleep spindles. There was no focal slowing. There were no interictal epileptiform discharges. There were no electrographic seizures identified. Photic stimulation and hyperventilation were not performed.  Impression and clinical correlation: This EEG was obtained while awake and asleep and is abnormal due to mild diffuse slowing indicative of global cerebral dysfunction. Epileptiform abnormalities were not seen during this recording.  Greg Leaks, MD [facility] [phone]  If 7pm- 7am, please page neurology on call as listed in AMION.

## 2023-12-16 NOTE — Evaluation (Signed)
 Occupational Therapy Evaluation Patient Details Name: Lawrence [name] MRN: [id number] DOB: 09-09-1955 Today's Date: 12/16/2023   History of Present Illness   Pt is a 68 yo male presenting to [facility] ED on 12/15/23 with worsening L sided weakness and slurred speech. Awaiting MRI. Pt was recently admitted on 11/27/23 after and unwitnessed fall found to be hypotensive and + for Covid 19. PMH of COPD, severe aortic stenosis s/p TAVR (2024), seizure disorder, CHF, Afib, hx of CVA, OSA, BPH, obesity.     Clinical Impressions Pt admitted for above, PTA pt was at SNF and had assist with bathing/dressing from staff and used the stedy to transfer. Pt currently presenting with more pronounced LUE deficits compared to baseline (see below) that impact his functional performance with ADLs and is generally weak in BUEs. Pt needing total A to min A for ADLs and Mod A+2 using stedy to transfer. He notes challenges with eating, will attempt to assess this during meal time. OT to continue working with pt acutely to address listed deficits and help transition to next level of care. Patient will benefit from continued inpatient follow up therapy, <3 hours/day      If plan is discharge home, recommend the following:   A lot of help with walking and/or transfers;Two people to help with walking and/or transfers;A lot of help with bathing/dressing/bathroom;Two people to help with bathing/dressing/bathroom     Functional Status Assessment   Patient has had a recent decline in their functional status and demonstrates the ability to make significant improvements in function in a reasonable and predictable amount of time.     Equipment Recommendations   None recommended by OT     Recommendations for Other Services         Precautions/Restrictions   Precautions Precautions: Fall Recall of Precautions/Restrictions: Intact Restrictions Weight Bearing Restrictions Per Provider Order: No     Mobility Bed  Mobility   Bed Mobility: Supine to Sit     Supine to sit: Min assist     General bed mobility comments: min A to raise trunk, pt doing great job of using bed rails and repositioning extremities prn to provide push/pull    Transfers Overall transfer level: Needs assistance Equipment used: Ambulation equipment used Transfers: Sit to/from Stand, Bed to chair/wheelchair/BSC Sit to Stand: Mod assist, From elevated surface, Via lift equipment, +2 physical assistance           General transfer comment: Mod A +2 with pads and uses of rocking to lift bottom. Transfer via Lift Equipment: Stedy    Balance Overall balance assessment: Needs assistance Sitting-balance support: Feet supported, No upper extremity supported Sitting balance-Leahy Scale: Fair Sitting balance - Comments: sitting EOB with supervision   Standing balance support: During functional activity, Reliant on assistive device for balance Standing balance-Leahy Scale: Zero Standing balance comment: reliant on ext support                           ADL either performed or assessed with clinical judgement   ADL Overall ADL's : Needs assistance/impaired Eating/Feeding: Sitting;Minimal assistance Eating/Feeding Details (indicate cue type and reason): pt reports challenge with eating earlier. Based on conversation pt likely not in optimal position but will attempt to follow-up during meal time. Grooming: Sitting;Minimal assistance   Upper Body Bathing: Sitting;Moderate assistance   Lower Body Bathing: Maximal assistance;Sitting/lateral leans   Upper Body Dressing : Sitting;Maximal assistance   Lower Body Dressing: Bed level;Total assistance  Toilet Transfer: Moderate assistance;+2 for safety/equipment;+2 for physical assistance Toilet Transfer Details (indicate cue type and reason): mod A +2 using stedy Toileting- Clothing Manipulation and Hygiene: Total assistance;Bed level       Functional mobility  during ADLs: Moderate assistance;+2 for physical assistance (with stedy)       Vision Baseline Vision/History: 1 Wears glasses Vision Assessment?: Vision impaired- to be further tested in functional context Additional Comments: Pt reports increased "fuzzy" vision, wears glasses at baseline. Tracking [facility].     Perception Perception: Not tested       Praxis Praxis: WFL       Pertinent Vitals/Pain Pain Assessment Pain Assessment: 0-10 Pain Score: 8  Pain Location: R foot (dorsal) Pain Descriptors / Indicators: Constant, Grimacing, Guarding, Discomfort Pain Intervention(s): Monitored during session, Limited activity within patient's tolerance, Ice applied     Extremity/Trunk Assessment Upper Extremity Assessment Upper Extremity Assessment: Right hand dominant;RUE deficits/detail;LUE deficits/detail;Generalized weakness RUE Deficits / Details: 3/5 throughout although unsure if having pt full participation during MMT, AROM ~100 deg and able to achieve 130+ deg with AAROM. able to bring finger to touch nose, grasp functional to hold stedy LUE Deficits / Details: 3/5 throughout although unsure if having pt full participation during MMT. AROM ~90 deg and able to achieve 110 deg with AAROM, unable to touch nose without assist. grasp functional to hold stedy. reduced light touch. LUE Sensation: decreased light touch LUE Coordination: decreased fine motor;decreased gross motor   Lower Extremity Assessment Lower Extremity Assessment: Defer to PT evaluation       Communication Communication Communication: No apparent difficulties   Cognition Arousal: Alert Behavior During Therapy: WFL for tasks assessed/performed Cognition: No apparent impairments                               Following commands: Intact       Cueing  General Comments   Cueing Techniques: Verbal cues  no signs or symptoms of distress noted.   Exercises     Shoulder Instructions      Home  Living Family/patient expects to be discharged to:: Skilled nursing facility                                 Additional Comments: Was at [facility] for short term rehab      Prior Functioning/Environment               Mobility Comments: pt reports using stedy with assist from staff to transfer ADLs Comments: staff at [facility] have been assisting pt with bathing and dressing. unable to bathe his upper body without assist since recent CVA    OT Problem List: Decreased strength;Decreased activity tolerance;Impaired balance (sitting and/or standing);Impaired UE functional use;Decreased range of motion;Pain   OT Treatment/Interventions: Self-care/ADL training;Therapeutic exercise;Energy conservation;DME and/or AE instruction;Therapeutic activities;Patient/family education;Balance training      OT Goals(Current goals can be found in the care plan section)   Acute Rehab OT Goals Patient Stated Goal: To get back to walking OT Goal Formulation: With patient Time For Goal Achievement: 12/30/23 Potential to Achieve Goals: Good ADL Goals Pt Will Perform Grooming: sitting;with min assist Pt Will Perform Upper Body Bathing: sitting;with min assist (using LUE as a dependent stabilizer) Pt Will Perform Upper Body Dressing: sitting;with min assist Pt/caregiver will Perform Home Exercise Program: Increased strength;Increased ROM;Right Upper extremity;Both right and left upper  extremity;With written HEP provided;Independently   OT Frequency:  Min 2X/week    Co-evaluation PT/OT/SLP Co-Evaluation/Treatment: Yes Reason for Co-Treatment: For patient/therapist safety;To address functional/ADL transfers   OT goals addressed during session: ADL's and self-care;Strengthening/ROM      AM-PAC OT "6 Clicks" Daily Activity     Outcome Measure Help from another person eating meals?: A Little Help from another person taking care of personal grooming?: A Little Help from another person  toileting, which includes using toliet, bedpan, or urinal?: Total Help from another person bathing (including washing, rinsing, drying)?: A Lot Help from another person to put on and taking off regular upper body clothing?: A Lot Help from another person to put on and taking off regular lower body clothing?: Total 6 Click Score: 12   End of Session Equipment Utilized During Treatment: Other (comment) (stedy) Nurse Communication: Need for lift equipment (+2 stedy or hoyer lift)  Activity Tolerance: Patient tolerated treatment well Patient left: in chair;with call bell/phone within reach;with chair alarm set;Other (comment) (lift pad underneath)  OT Visit Diagnosis: Unsteadiness on feet (R26.81);Other abnormalities of gait and mobility (R26.89);Muscle weakness (generalized) (M62.81);Hemiplegia and hemiparesis;Pain Hemiplegia - Right/Left: Left Hemiplegia - dominant/non-dominant: Non-Dominant Hemiplegia - caused by:  (pending MRI) Pain - Right/Left: Right Pain - part of body: Ankle and joints of foot                Time: 1610-9604 OT Time Calculation (min): 36 min Charges:  OT General Charges $OT Visit: 1 Visit OT Evaluation $OT Eval Moderate Complexity: 1 Mod  12/16/2023  AB, OTR/L  Acute Rehabilitation Services  Office: [phone]   Jorene New 12/16/2023, 1:53 PM

## 2023-12-16 NOTE — Care Management Obs Status (Signed)
 MEDICARE OBSERVATION STATUS NOTIFICATION   Patient Details  Name: Lawrence [name] MRN: [id number] Date of Birth: May 02, 1956   Medicare Observation Status Notification Given:  Yes    Darcella Shiffman G., RN 12/16/2023, 9:24 AM

## 2023-12-16 NOTE — Progress Notes (Signed)
 Updated [name] representative on patient's status and plan of care.

## 2023-12-16 NOTE — Progress Notes (Addendum)
 STROKE TEAM PROGRESS NOTE    INTERIM HISTORY/SUBJECTIVE  Patient has remained hemodynamically stable and afebrile.  He is more alert today.  He was unable to have MRI performed as remote for his spinal stimulator could not be found.  MRI can be performed on Monday with [employer] representative is able to come in with an alternate remote.  OBJECTIVE  CBC    Component Value Date/Time   WBC 9.3 12/16/2023 0737   RBC 4.56 12/16/2023 0737   HGB 13.5 12/16/2023 0737   HCT 41.3 12/16/2023 0737   PLT 199 12/16/2023 0737   MCV 90.6 12/16/2023 0737   MCH 29.6 12/16/2023 0737   MCHC 32.7 12/16/2023 0737   RDW 13.2 12/16/2023 0737   LYMPHSABS 1.1 12/15/2023 0940   MONOABS 1.3 (H) 12/15/2023 0940   EOSABS 0.2 12/15/2023 0940   BASOSABS 0.1 12/15/2023 0940    BMET    Component Value Date/Time   NA 137 12/16/2023 0737   NA 136 02/15/2023 1415   K 3.7 12/16/2023 0737   CL 97 (L) 12/16/2023 0737   CO2 26 12/16/2023 0737   GLUCOSE 214 (H) 12/16/2023 0737   BUN 18 12/16/2023 0737   BUN 37 (H) 02/15/2023 1415   CREATININE 1.13 12/16/2023 0737   CALCIUM  8.8 (L) 12/16/2023 0737   EGFR 78 02/15/2023 1415   GFRNONAA >60 12/16/2023 0737    IMAGING past 24 hours DG Chest Portable 1 View Result Date: 12/15/2023 CLINICAL DATA:  Cough EXAM: PORTABLE CHEST 1 VIEW COMPARISON:  Chest radiograph 11/28/2023 FINDINGS: Cardiomegaly. Monitoring leads overlie the patient. No large area pulmonary consolidation. No pleural effusion or pneumothorax. Mild pulmonary vascular redistribution. IMPRESSION: Cardiomegaly with mild pulmonary vascular redistribution. Electronically Signed   By: Jone Neither M.D.   On: 12/15/2023 16:16    Vitals:   12/16/23 0037 12/16/23 0442 12/16/23 0850 12/16/23 1104  BP:  119/62 (!) 142/73 (!) 151/80  Pulse: 89  88 (!) 102  Resp:  20  20  Temp:  98.3 F (36.8 C) 98.6 F (37 C) 98 F (36.7 C)  TempSrc:  Oral Oral Oral  SpO2:  90% 95% 95%  Weight:      Height:          PHYSICAL EXAM General: Chronically ill-appearing elderly patient in no acute distress Psych:  Mood and affect appropriate for situation CV: Regular rate and rhythm on monitor Respiratory:  Regular, unlabored respirations on room air  NEURO:  Mental Status: AA&Ox3 Speech/Language: speech is without dysarthria or aphasia.    Cranial Nerves:  II: PERRL. Visual fields full.  III, IV, VI: EOMI. Eyelids elevate symmetrically.  V: Sensation is intact to light touch and diminished on the left VII: Face is symmetrical resting and smiling VIII: hearing intact to voice. IX, X: Phonation is normal.  FA:OZHYQMVH shrug 5/5. XII: tongue is midline without fasciculations. Motor: Able to move bilateral upper extremities with good antigravity strength, some drift in the left arm, able to lift right lower extremity off the bed, can move left lower extremity but cannot lift it off the bed Tone: is normal and bulk is normal Sensation- Intact to light touch bilaterally. Extinction absent to light touch to DSS.   Coordination: FTN intact bilaterally Gait- deferred  Most Recent NIH  1a Level of Conscious.: 0 1b LOC Questions: 0 1c LOC Commands: 0 2 Best Gaze: 0 3 Visual: 0 4 Facial Palsy: 0 5a Motor Arm - left: 1 5b Motor Arm - Right: 0 6a  Motor Leg - Left: 3 6b Motor Leg - Right: 0 7 Limb Ataxia: 0 8 Sensory: 1 9 Best Language: 0 10 Dysarthria: 0 11 Extinct. and Inatten.: 0 TOTAL: 5   ASSESSMENT/PLAN  Lawrence [name] is a 68 y.o. male with history of anemia, anxiety, asthma, BPH, CHF, COPD, depression, neuropathy, hypertension, sleep apnea, PTSD, A-fib on Eliquis , hyperlipidemia, seizures, diabetes and stroke with residual left-sided weakness admitted after being found slumped over in his wheelchair in his facility with worsening left-sided weakness and confusion.  He was found to have a UTI and has been placed on Rocephin .  He will need MRI to determine if he has had another  stroke, but this will need to be performed on Monday due to the remote to his spinal stimulator being lost.  NIH on Admission 13  Acute ischemic stroke versus recrudescence of old stroke symptoms in the setting of UTI, seizure also on the differential Code Stroke CT head No acute abnormality.  Atrophy. ASPECTS 10.    CTA head & neck pending, will order if stroke seen on MRI MRI pending, will need to be performed on Monday with [facility] rep is able to bring in remote for nerve stimulator 2D Echo pending LDL 79 HgbA1c 6.6 EEG shows diffuse sowing. VTE prophylaxis -fully anticoagulated with Eliquis  Eliquis  (apixaban ) daily prior to admission, now on Eliquis  (apixaban ) daily  Therapy recommendations:  SNF Disposition: Ending  Hx of Stroke/TIA Patient has a history of a stroke with left-sided weakness, details unknown  Atrial fibrillation Home Meds: Eliquis  5 mg twice daily Continue telemetry monitoring Continue anticoagulation with Eliquis   Hypertension Home meds: Spironolactone  25 mg daily Stable Maintain normotension  Hyperlipidemia Home meds: Atorvastatin  40 mg daily, increased to 80 LDL 79, goal < 70 Continue statin at discharge  Diabetes type II Controlled Home meds:  Mounjaro  15 mg weekly HgbA1c 6.6, goal < 7.0 CBGs SSI Recommend close follow-up with PCP for better DM control  Other Stroke Risk Factors Obesity, Body mass index is 43.87 kg/m., BMI >/= 30 associated with increased stroke risk, recommend weight loss, diet and exercise as appropriate  Congestive heart failure   Other Active Problems Seizure disorder-continue home Vimpat  and Keppra   Hospital day # 0  Patient seen by NP and then by MD, MD to edit note as needed. Cortney E Bucky Cardinal , MSN, AGACNP-BC [facility] See Amion for schedule and pager information 12/16/2023 2:30 PM     ATTENDING ATTESTATION:  68 year old with history of CHF COPD A-fib on Eliquis  with previous  stroke and left-sided weakness found to have worsening weakness on the same side concern for possible recrudescence.  Unable to get MRI due to spinal cord stimulator.  EEG is unremarkable.  Echo pending.  Continue Eliquis  for now further recommendations after MRI is completed   Dr. Dahlia Dross evaluated pt independently, reviewed imaging, chart, labs. Discussed and formulated plan with the Resident/APP. Changes were made to the note where appropriate. Please see APP/resident note above for details.   Total 36 minutes spent on counseling patient and coordinating care, writing notes and reviewing chart.    Eswin Worrell,MD     To contact Stroke Continuity provider, please refer to [email or web address]. After hours, contact General Neurology

## 2023-12-16 NOTE — Progress Notes (Signed)
 EEG complete - results pending

## 2023-12-16 NOTE — Plan of Care (Signed)
  Problem: Education: Goal: Knowledge of risk factors and measures for prevention of condition will improve Outcome: Progressing   Problem: Fluid Volume: Goal: Ability to maintain a balanced intake and output will improve Outcome: Progressing   Problem: Skin Integrity: Goal: Risk for impaired skin integrity will decrease Outcome: Progressing   Problem: Safety: Goal: Ability to remain free from injury will improve Outcome: Progressing

## 2023-12-16 NOTE — Evaluation (Signed)
 Physical Therapy Evaluation Patient Details Name: Lawrence [name] MRN: [id number] DOB: April 20, 1956 Today's Date: 12/16/2023  History of Present Illness  Pt is a 68 yo male presenting to [facility] ED on 12/15/23 with worsening L sided weakness and slurred speech. Awaiting MRI (pending being able to turn off his spinal stimulator). Pt was recently admitted on 11/27/23 after and unwitnessed fall found to be hypotensive and + for Covid 19. PMH of COPD, severe aortic stenosis s/p TAVR (2024), seizure disorder, CHF, Afib, hx of CVA, OSA, BPH, obesity.  Clinical Impression  Pt presenting similarly to before he left the hospital last month (per previous PT notes).  He is two person assist to stand and transfer with standing frame and needs mechanical advantage of a highly elevated bed due to his height and size.  He will continue to benefit from acute and post acute rehab to get him to his highest level of functional mobility.  He was from a SNF for rehab prior to admit [facility]).   PT to follow acutely for deficits listed below.           If plan is discharge home, recommend the following: Two people to help with walking and/or transfers;Two people to help with bathing/dressing/bathroom   Can travel by private vehicle   No    Equipment Recommendations Hospital bed;Hoyer lift;Wheelchair (measurements PT);Wheelchair cushion (measurements PT) (bariatric equipment)  Recommendations for Other Services       Functional Status Assessment Patient has had a recent decline in their functional status and demonstrates the ability to make significant improvements in function in a reasonable and predictable amount of time.     Precautions / Restrictions Precautions Precautions: Fall      Mobility  Bed Mobility Overal bed mobility: Needs Assistance Bed Mobility: Supine to Sit     Supine to sit: Min assist     General bed mobility comments: Min hand held assist to pull trunk up to sitting EOB, using rails  and HOB elevated, but it was very light min assist.    Transfers Overall transfer level: Needs assistance Equipment used: Ambulation equipment used Transfers: Sit to/from Stand, Bed to chair/wheelchair/BSC Sit to Stand: Mod assist, From elevated surface, Via lift equipment, +2 physical assistance           General transfer comment: Mod A +2 with pads and uses of rocking to lift bottom. Transfer via Lift Equipment: Stedy  Ambulation/Gait               General Gait Details: Pt reports he has been non ambulatory for "several" years, mostly non ambulatory since stroke and because legs give out and arms are not strong enough to support him on RW.  Stairs            Wheelchair Mobility     Tilt Bed    Modified Rankin (Stroke Patients Only) Modified Rankin (Stroke Patients Only) Pre-Morbid Rankin Score: Severe disability Modified Rankin: Severe disability     Balance Overall balance assessment: Needs assistance Sitting-balance support: Feet supported, No upper extremity supported Sitting balance-Leahy Scale: Good Sitting balance - Comments: close supervision EOB   Standing balance support: During functional activity, Reliant on assistive device for balance Standing balance-Leahy Scale: Zero Standing balance comment: reliant on ext support                             Pertinent Vitals/Pain Pain Assessment Pain Assessment: 0-10 Pain Score:  8  Pain Location: R foot (dorsal) Pain Descriptors / Indicators: Constant, Grimacing, Guarding, Discomfort Pain Intervention(s): Limited activity within patient's tolerance, Monitored during session, Repositioned, Ice applied    Home Living Family/patient expects to be discharged to:: Skilled nursing facility                   Additional Comments: Was at [facility] for short term rehab    Prior Function Prior Level of Function : Needs assist       Physical Assist : Mobility (physical) Mobility  (physical): Bed mobility;Transfers;Gait   Mobility Comments: pt reports using stedy with assist from staff to transfer ADLs Comments: staff at [facility] have been assisting pt with bathing and dressing. unable to bathe his upper body without assist since recent CVA     Extremity/Trunk Assessment   Upper Extremity Assessment Upper Extremity Assessment: Defer to OT evaluation RUE Deficits / Details: 3/5 throughout although unsure if having pt full participation during MMT, AROM ~100 deg and able to achieve 130+ deg with AAROM. able to bring finger to touch nose, grasp functional to hold stedy LUE Deficits / Details: 3/5 throughout although unsure if having pt full participation during MMT. AROM ~90 deg and able to achieve 110 deg with AAROM, unable to touch nose without assist. grasp functional to hold stedy. reduced light touch. LUE Sensation: decreased light touch LUE Coordination: decreased fine motor;decreased gross motor    Lower Extremity Assessment Lower Extremity Assessment: RLE deficits/detail;LLE deficits/detail RLE Deficits / Details: bil LE weakness with left greater than right.  Right ankle DF/PF at least 3/5, knee extension 3/5, hip flexion 3/5 RLE Sensation: decreased light touch (knee down) RLE Coordination: decreased gross motor (has tremors in bil LEs when resting) LLE Deficits / Details: left leg is weaker than right and also has h/o TKA x2, ankle 3/5, knee 3-/5, hip flex 3-/5 LLE Sensation: decreased light touch (knee down)    Cervical / Trunk Assessment Cervical / Trunk Assessment: Normal  Communication   Communication Communication: No apparent difficulties    Cognition Arousal: Alert Behavior During Therapy: WFL for tasks assessed/performed   PT - Cognitive impairments: No apparent impairments                         Following commands: Intact       Cueing Cueing Techniques: Verbal cues     General Comments General comments (skin integrity,  edema, etc.): HR 109-111 bpm throughout, mildly lightheaded, but able to continue.    Exercises     Assessment/Plan    PT Assessment Patient needs continued PT services  PT Problem List Decreased strength;Cardiopulmonary status limiting activity;Decreased activity tolerance;Decreased balance;Decreased mobility;Decreased range of motion;Decreased knowledge of use of DME       PT Treatment Interventions Balance training;DME instruction;Gait training;Neuromuscular re-education;Functional mobility training;Therapeutic activities;Wheelchair mobility training;Patient/family education;Therapeutic exercise;Modalities    PT Goals (Current goals can be found in the Care Plan section)  Acute Rehab PT Goals Patient Stated Goal: get stronger, walk again PT Goal Formulation: With patient Time For Goal Achievement: 12/30/23 Potential to Achieve Goals: Good    Frequency Min 1X/week     Co-evaluation PT/OT/SLP Co-Evaluation/Treatment: Yes Reason for Co-Treatment: Complexity of the patient's impairments (multi-system involvement);Necessary to address cognition/behavior during functional activity;For patient/therapist safety;To address functional/ADL transfers PT goals addressed during session: Mobility/safety with mobility;Balance OT goals addressed during session: ADL's and self-care;Strengthening/ROM       AM-PAC PT "6 Clicks" Mobility  Outcome Measure Help needed turning from your back to your side while in a flat bed without using bedrails?: A Little Help needed moving from lying on your back to sitting on the side of a flat bed without using bedrails?: A Little Help needed moving to and from a bed to a chair (including a wheelchair)?: Total Help needed standing up from a chair using your arms (e.g., wheelchair or bedside chair)?: Total Help needed to walk in hospital room?: Total Help needed climbing 3-5 steps with a railing? : Total 6 Click Score: 10    End of Session   Activity  Tolerance: Patient limited by pain Patient left: in chair;with call bell/phone within reach;with chair alarm set Nurse Communication: Mobility status;Need for lift equipment;Other (comment) (has lift pad under him) PT Visit Diagnosis: Muscle weakness (generalized) (M62.81);History of falling (Z91.81);Difficulty in walking, not elsewhere classified (R26.2);Unsteadiness on feet (R26.81)    Time: 9604-5409 PT Time Calculation (min) (ACUTE ONLY): 36 min   Charges:   PT Evaluation $PT Eval Moderate Complexity: 1 Mod   PT General Charges $$ ACUTE PT VISIT: 1 Visit        Alveria Avena, PT, DPT  Acute Rehabilitation Secure chat is best for contact #[phone] office    12/16/2023, 3:08 PM

## 2023-12-16 NOTE — Progress Notes (Signed)
 Patient's wife at bedside at 0730, states she is unable to locate the remote for the patient's nerve stimulator. States she will be checking the lost and found at [facility] on Monday. MD Hildy Lowers aware, will check with neurologist. Unable to obtain MRI until stimulator can be turned off.

## 2023-12-17 DIAGNOSIS — R400 Somnolence: Secondary | ICD-10-CM

## 2023-12-17 DIAGNOSIS — E119 Type 2 diabetes mellitus without complications: Secondary | ICD-10-CM

## 2023-12-17 DIAGNOSIS — I5032 Chronic diastolic (congestive) heart failure: Secondary | ICD-10-CM | POA: Diagnosis not present

## 2023-12-17 DIAGNOSIS — R410 Disorientation, unspecified: Secondary | ICD-10-CM | POA: Diagnosis not present

## 2023-12-17 DIAGNOSIS — R531 Weakness: Secondary | ICD-10-CM | POA: Diagnosis not present

## 2023-12-17 DIAGNOSIS — N390 Urinary tract infection, site not specified: Secondary | ICD-10-CM | POA: Diagnosis not present

## 2023-12-17 DIAGNOSIS — E66813 Obesity, class 3: Secondary | ICD-10-CM | POA: Diagnosis not present

## 2023-12-17 DIAGNOSIS — E1142 Type 2 diabetes mellitus with diabetic polyneuropathy: Secondary | ICD-10-CM

## 2023-12-17 DIAGNOSIS — I4891 Unspecified atrial fibrillation: Secondary | ICD-10-CM | POA: Diagnosis not present

## 2023-12-17 DIAGNOSIS — Z794 Long term (current) use of insulin: Secondary | ICD-10-CM

## 2023-12-17 LAB — LIPID PANEL
Cholesterol: 177 mg/dL (ref 0–200)
HDL: 34 mg/dL — ABNORMAL LOW (ref >40)
LDL Cholesterol: 107 mg/dL — ABNORMAL HIGH (ref 0–99)
Total CHOL/HDL Ratio: 5.2 ratio
Triglycerides: 179 mg/dL — ABNORMAL HIGH (ref <150)
VLDL: 36 mg/dL (ref 0–40)

## 2023-12-17 LAB — CBC
HCT: 40.5 % (ref 39.0–52.0)
Hemoglobin: 13.4 g/dL (ref 13.0–17.0)
MCH: 30 pg (ref 26.0–34.0)
MCHC: 33.1 g/dL (ref 30.0–36.0)
MCV: 90.8 fL (ref 80.0–100.0)
Platelets: 175 10*3/uL (ref 150–400)
RBC: 4.46 MIL/uL (ref 4.22–5.81)
RDW: 13.2 % (ref 11.5–15.5)
WBC: 6.2 10*3/uL (ref 4.0–10.5)
nRBC: 0 % (ref 0.0–0.2)

## 2023-12-17 LAB — BASIC METABOLIC PANEL WITH GFR
Anion gap: 10 (ref 5–15)
BUN: 18 mg/dL (ref 8–23)
CO2: 27 mmol/L (ref 22–32)
Calcium: 8.8 mg/dL — ABNORMAL LOW (ref 8.9–10.3)
Chloride: 100 mmol/L (ref 98–111)
Creatinine, Ser: 1.04 mg/dL (ref 0.61–1.24)
GFR, Estimated: >60 mL/min (ref >60)
Glucose, Bld: 152 mg/dL — ABNORMAL HIGH (ref 70–99)
Potassium: 3.6 mmol/L (ref 3.5–5.1)
Sodium: 137 mmol/L (ref 135–145)

## 2023-12-17 LAB — GLUCOSE, CAPILLARY
Glucose-Capillary: 147 mg/dL — ABNORMAL HIGH (ref 70–99)
Glucose-Capillary: 144 mg/dL — ABNORMAL HIGH (ref 70–99)
Glucose-Capillary: 130 mg/dL — ABNORMAL HIGH (ref 70–99)
Glucose-Capillary: 138 mg/dL — ABNORMAL HIGH (ref 70–99)

## 2023-12-17 MED ORDER — STROKE: EARLY STAGES OF RECOVERY BOOK
Freq: Once | Status: AC
  Filled 2023-12-17: qty 1

## 2023-12-17 NOTE — Progress Notes (Signed)
 PROGRESS NOTE    Lawrence [name]  NWG:[id number] DOB: 11/26/1955 DOA: 12/15/2023 PCP: Darnelle Elders, PA-C    Chief Complaint  Patient presents with   Code Stroke    Brief Narrative:  Patient 68 year old gentleman history of COPD, severe aortic stenosis status post TAVR July 2024, seizure disorder, chronic diastolic CHF, A-fib on Eliquis , CVA, gout, OSA on CPAP, anxiety, BPH, chronic venous stasis, morbid obesity presenting with worsening left-sided weakness from his baseline, slurred speech concern for acute CVA.  CT head done unremarkable.  Patient seen in consultation by neurology who recommended MRI of the brain.  Patient however with a spinal cord stimulator, wife trying to locate remote and as such MRI unable to to be done yet.  Urinalysis concerning for UTI patient placed on IV antibiotics.   Assessment & Plan:   Principal Problem:   Acute left-sided weakness Active Problems:   Type 2 diabetes mellitus with obesity (HCC)   COPD (chronic obstructive pulmonary disease) (HCC)   AF (paroxysmal atrial fibrillation) (HCC)   Chronic diastolic CHF (congestive heart failure) (HCC)   S/P TAVR (transcatheter aortic valve replacement)   Seizure disorder (HCC)   Class 3 obesity   OSA (obstructive sleep apnea)   Obesity, Class III, BMI 40-49.9 (morbid obesity)   PTSD (post-traumatic stress disorder)   BPH without obstruction/lower urinary tract symptoms   Peripheral neuropathy   Physical deconditioning   Pure hypercholesterolemia   Chronic venous stasis   Diabetic neuropathy (HCC)   DM2 (diabetes mellitus, type 2) (HCC)   Primary hypertension   Coronary artery disease due to lipid rich plaque   Stroke-like symptoms   Depression   Stage 3a chronic kidney disease (HCC)   MDD (major depressive disorder), recurrent severe, without psychosis (HCC)   UTI (urinary tract infection)  #1 left-sided weakness: Acute ischemic CVA versus recrudescence of prior CVA in the setting of  UTI -Patient with prior history of CVA with some residual left-sided weakness presenting with sudden onset worsening left-sided weakness from baseline, slurred speech, concern for possible facial droop. - Patient brought in as a code stroke. - CT head done negative for any acute abnormalities. -Chest x-ray done negative for any acute abnormalities. -Urinalysis pending. - Patient seen in consultation by neurology who recommended MRI of the brain without contrast and if negative no further workup needed. -Patient with spinal cord stimulator however wife unable to locate remote. -Patient seen by neurology and recommending MRI to be done which will likely be done on Monday when [facility] Scientific representative is able to bring in remote for nerve stimulator per neurology. -EEG done with mild diffuse slowing indicative of global cerebral dysfunction.  Epileptiform abnormalities not seen. -Continue home regimen Lipitor, Tricor , Lovaza, Eliquis . -PT/OT. -Likely needs SNF. -Neurology following and I appreciate their input and recommendations.   2.  A-fib - Currently rate controlled.  - Patient noted not on rate limiting medications prior to admission.  - Continue Eliquis  for anticoagulation.  3.  UTI -Urine cultures > 100,000 colonies of Klebsiella pneumonia.  Sensitivities pending.   - Continue IV Rocephin .    4.  Diabetes mellitus type 2 -Hemoglobin A1c noted at 6.6 on 10/04/2023. - CBG noted at 147 this morning.   - SSI.     5.  Depression//anxiety - Continue home regimen Abilify , Effexor . -Decreased BuSpar  to 5 mg 3 times daily as per wife and RN patient with some bouts of sleepiness intermittently.   6.  Chronic diastolic heart failure/CAD/status post TAVR -  Currently stable. -Euvolemic on examination. - Continue home cardiac regimen of Lipitor, spironolactone , Tricor , Lovaza, Zaroxolyn , Demadex .   7.  CKD stage IIIa -Stable.   8.  Seizure disorder -Stable. - EEG obtained  negative for any epileptiform discharges noted.  -Continue home regimen Keppra , lacosamide .   9.  COPD - Continue home regimen Incruse Ellipta . - Dulera.   10.  Obesity class III -Lifestyle modification - Outpatient follow-up with PCP.   11.  OSA -CPAP nightly.    #12 drowsiness -Patient noted with intermittent bouts of sleepiness and drowsiness however easily arousable and following commands. -Home regimen BuSpar  has been decreased to 5 mg 3 times daily. -Will discontinue home dose of Remeron . -Trazodone  has been decreased to 150 mg nightly which we will continue for now. -Check an ammonia level. -Supportive care.     DVT prophylaxis: Eliquis  Code Status: DNR Family Communication: Updated patient.  No family at bedside.  Disposition: Likely back to nursing home.  Status is: Inpatient The patient will require care spanning > 2 midnights and should be moved to inpatient because: Severity of illness   Consultants:  Neurology: Dr. Murvin Arthurs 12/15/2023  Procedures:  CT head 12/15/2023 Chest x-ray 12/15/2023 EEG  Antimicrobials:  Anti-infectives (From admission, onward)    Start     Dose/Rate Route Frequency Ordered Stop   12/16/23 0900  cefTRIAXone  (ROCEPHIN ) 2 g in sodium chloride  0.9 % 100 mL IVPB        2 g 200 mL/hr over 30 Minutes Intravenous Every 24 hours 12/16/23 0831     12/15/23 1515  linezolid  (ZYVOX ) tablet 600 mg  Status:  Discontinued        600 mg Oral 2 times daily 12/15/23 1508 12/15/23 1519         Subjective: Patient laying in bed sleeping but easily arousable.  Patient somewhat drowsy today.  Denies any chest pain or shortness of breath.  Denies any abdominal pain.  Some improvement with left-sided weakness.  No significant slurred or dysarthric speech noted.    Objective: Vitals:   12/17/23 0824 12/17/23 0824 12/17/23 1215 12/17/23 1529  BP:  135/89 139/89 (!) 142/59  Pulse:  81 70 75  Resp:  18 18 18   Temp:  97.8 F (36.6 C) (!) 97.5 F  (36.4 C) 97.6 F (36.4 C)  TempSrc:  Oral Oral Oral  SpO2: 95% 95% 94% 100%  Weight:      Height:        Intake/Output Summary (Last 24 hours) at 12/17/2023 1555 Last data filed at 12/17/2023 1200 Gross per 24 hour  Intake 690.44 ml  Output 2750 ml  Net -2059.56 ml   Filed Weights   12/15/23 1018  Weight: (!) 155 kg    Examination:  General exam: Appears calm and comfortable.  Drowsy. Respiratory system: CTAB anterior lung fields.  No wheezes, no crackles, no rhonchi.  Fair air movement.  Speaking in full sentences.  Cardiovascular system: Regular rate rhythm no murmurs rubs or gallops.  No JVD.  No pitting lower extremity edema.  Gastrointestinal system: Abdomen is soft, nontender, nondistended, positive bowel sounds.  No rebound.  No guarding. Central nervous system: Alert and oriented.  Moving extremities spontaneously.  No focal neurological deficits. Extremities: Symmetric 5 x 5 power. Skin: No rashes, lesions or ulcers Psychiatry: Judgement and insight appear normal. Mood & affect appropriate.     Data Reviewed: I have personally reviewed following labs and imaging studies  CBC: Recent Labs  Lab 12/15/23 0940  12/15/23 0943 12/16/23 0737 12/17/23 0708  WBC 13.6*  --  9.3 6.2  NEUTROABS 11.0*  --   --   --   HGB 13.9 14.6 13.5 13.4  HCT 40.7 43.0 41.3 40.5  MCV 88.7  --  90.6 90.8  PLT 207  --  199 175    Basic Metabolic Panel: Recent Labs  Lab 12/15/23 0940 12/15/23 0943 12/16/23 0737 12/17/23 0708  NA 133* 133* 137 137  K 3.8 3.7 3.7 3.6  CL 95* 94* 97* 100  CO2 26  --  26 27  GLUCOSE 249* 242* 214* 152*  BUN 21 24* 18 18  CREATININE 1.26* 1.20 1.13 1.04  CALCIUM  8.9  --  8.8* 8.8*  MG  --   --  1.9  --   PHOS  --   --  3.0  --     GFR: Estimated Creatinine Clearance: 110 mL/min (by C-G formula based on SCr of 1.04 mg/dL).  Liver Function Tests: Recent Labs  Lab 12/15/23 0940  AST 16  ALT 22  ALKPHOS 56  BILITOT 0.9  PROT 6.7   ALBUMIN 3.4*    CBG: Recent Labs  Lab 12/16/23 1103 12/16/23 1642 12/16/23 2143 12/17/23 0640 12/17/23 1216  GLUCAP 198* 154* 155* 147* 144*     Recent Results (from the past 240 hours)  Urine Culture (for pregnant, neutropenic or urologic patients or patients with an indwelling urinary catheter)     Status: Abnormal (Preliminary result)   Collection Time: 12/15/23  3:10 PM   Specimen: Urine, Catheterized  Result Value Ref Range Status   Specimen Description URINE, CATHETERIZED  Final   Special Requests NONE  Final   Culture (A)  Final    >=100,000 COLONIES/mL KLEBSIELLA PNEUMONIAE SUSCEPTIBILITIES TO FOLLOW Performed at [facility], [address], [place], [place] [address]    Report Status PENDING  Incomplete         Radiology Studies: EEG adult Result Date: 12/16/2023 Eleni Griffin, MD     12/16/2023  3:49 PM Routine EEG Report RHONIN LAURIANO is a 68 y.o. male with a history of seizure who is undergoing an EEG to evaluate for seizures. Report: This EEG was acquired with electrodes placed according to the International 10-20 electrode system (including Fp1, Fp2, F3, F4, C3, C4, P3, P4, O1, O2, T3, T4, T5, T6, A1, A2, Fz, Cz, Pz). The following electrodes were missing or displaced: none. The occipital dominant rhythm was 6-7 Hz. This activity is reactive to stimulation. Drowsiness was manifested by background fragmentation; deeper stages of sleep were identified by K complexes and sleep spindles. There was no focal slowing. There were no interictal epileptiform discharges. There were no electrographic seizures identified. Photic stimulation and hyperventilation were not performed. Impression and clinical correlation: This EEG was obtained while awake and asleep and is abnormal due to mild diffuse slowing indicative of global cerebral dysfunction. Epileptiform abnormalities were not seen during this recording. Greg Leaks, MD [facility] [phone] If  7pm- 7am, please page neurology on call as listed in AMION.        Scheduled Meds:  [START ON 12/18/2023]  stroke: early stages of recovery book   Does not apply Once   allopurinol   100 mg Oral Daily   apixaban   5 mg Oral BID   ARIPiprazole   5 mg Oral Daily   atorvastatin   80 mg Oral QHS   busPIRone   5 mg Oral TID   colchicine   0.6 mg  Oral Daily   cyanocobalamin   1,000 mcg Oral Daily   fenofibrate   54 mg Oral Daily   ferrous sulfate   325 mg Oral Daily   fluticasone   2 spray Each Nare Daily   fluticasone  furoate-vilanterol  1 puff Inhalation Daily   insulin  aspart  0-9 Units Subcutaneous TID WC   lacosamide   150 mg Oral BID   levETIRAcetam   500 mg Oral BID   [START ON 12/19/2023] metolazone   2.5 mg Oral Weekly   midodrine   5 mg Oral TID WC   mirtazapine   7.5 mg Oral QHS   omega-3 acid ethyl esters  2 g Oral BID   pantoprazole   40 mg Oral Daily   polyethylene glycol  17 g Oral Daily   potassium chloride   40 mEq Oral q AM   And   potassium chloride   20 mEq Oral QPM   prazosin   2 mg Oral QHS   pregabalin   150 mg Oral TID   rOPINIRole   0.25 mg Oral BID   senna-docusate  2 tablet Oral BID   sodium chloride  flush  3 mL Intravenous Once   sodium chloride  flush  3 mL Intravenous Q12H   spironolactone   12.5 mg Oral Daily   torsemide   20 mg Oral BID   traZODone   150 mg Oral QHS   umeclidinium bromide   1 puff Inhalation Daily   venlafaxine  XR  150 mg Oral Q breakfast   Continuous Infusions:  cefTRIAXone  (ROCEPHIN )  IV 2 g (12/17/23 0853)     LOS: 1 day    Time spent: 40 minutes    Hilda Lovings, MD [facility]   To contact the attending provider between 7A-7P or the covering provider during after hours 7P-7A, please log into the web site [email or web address] and access using universal [place] password for that web site. If you do not have the password, please call the hospital operator.  12/17/2023, 3:55 PM

## 2023-12-17 NOTE — Plan of Care (Signed)
  Problem: Education: Goal: Knowledge of risk factors and measures for prevention of condition will improve Outcome: Progressing   Problem: Coping: Goal: Psychosocial and spiritual needs will be supported Outcome: Progressing   Problem: Respiratory: Goal: Will maintain a patent airway Outcome: Progressing Goal: Complications related to the disease process, condition or treatment will be avoided or minimized Outcome: Progressing   Problem: Education: Goal: Ability to describe self-care measures that may prevent or decrease complications (Diabetes Survival Skills Education) will improve Outcome: Progressing Goal: Individualized Educational Video(s) Outcome: Progressing   Problem: Coping: Goal: Ability to adjust to condition or change in health will improve Outcome: Progressing   Problem: Fluid Volume: Goal: Ability to maintain a balanced intake and output will improve Outcome: Progressing   Problem: Health Behavior/Discharge Planning: Goal: Ability to identify and utilize available resources and services will improve Outcome: Progressing Goal: Ability to manage health-related needs will improve Outcome: Progressing   Problem: Metabolic: Goal: Ability to maintain appropriate glucose levels will improve Outcome: Progressing   Problem: Nutritional: Goal: Maintenance of adequate nutrition will improve Outcome: Progressing Goal: Progress toward achieving an optimal weight will improve Outcome: Progressing   Problem: Skin Integrity: Goal: Risk for impaired skin integrity will decrease Outcome: Progressing   Problem: Tissue Perfusion: Goal: Adequacy of tissue perfusion will improve Outcome: Progressing   Problem: Education: Goal: Knowledge of General Education information will improve Description: Including pain rating scale, medication(s)/side effects and non-pharmacologic comfort measures Outcome: Progressing   Problem: Health Behavior/Discharge Planning: Goal:  Ability to manage health-related needs will improve Outcome: Progressing   Problem: Clinical Measurements: Goal: Ability to maintain clinical measurements within normal limits will improve Outcome: Progressing Goal: Will remain free from infection Outcome: Progressing Goal: Diagnostic test results will improve Outcome: Progressing Goal: Respiratory complications will improve Outcome: Progressing Goal: Cardiovascular complication will be avoided Outcome: Progressing   Problem: Activity: Goal: Risk for activity intolerance will decrease Outcome: Progressing   Problem: Nutrition: Goal: Adequate nutrition will be maintained Outcome: Progressing   Problem: Coping: Goal: Level of anxiety will decrease Outcome: Progressing   Problem: Elimination: Goal: Will not experience complications related to bowel motility Outcome: Progressing Goal: Will not experience complications related to urinary retention Outcome: Progressing   Problem: Pain Managment: Goal: General experience of comfort will improve and/or be controlled Outcome: Progressing   Problem: Safety: Goal: Ability to remain free from injury will improve Outcome: Progressing   Problem: Skin Integrity: Goal: Risk for impaired skin integrity will decrease Outcome: Progressing   Problem: Education: Goal: Knowledge of disease or condition will improve Outcome: Progressing Goal: Knowledge of secondary prevention will improve (MUST DOCUMENT ALL) Outcome: Progressing Goal: Knowledge of patient specific risk factors will improve (DELETE if not current risk factor) Outcome: Progressing   Problem: Ischemic Stroke/TIA Tissue Perfusion: Goal: Complications of ischemic stroke/TIA will be minimized Outcome: Progressing   Problem: Coping: Goal: Will verbalize positive feelings about self Outcome: Progressing Goal: Will identify appropriate support needs Outcome: Progressing   Problem: Health Behavior/Discharge  Planning: Goal: Ability to manage health-related needs will improve Outcome: Progressing Goal: Goals will be collaboratively established with patient/family Outcome: Progressing   Problem: Self-Care: Goal: Ability to participate in self-care as condition permits will improve Outcome: Progressing Goal: Verbalization of feelings and concerns over difficulty with self-care will improve Outcome: Progressing Goal: Ability to communicate needs accurately will improve Outcome: Progressing

## 2023-12-17 NOTE — TOC Initial Note (Signed)
 Transition of Care [facility]) - Initial/Assessment Note    Patient Details  Name: Lawrence [name] MRN: [id number] Date of Birth: 07-Oct-1955  Transition of [facility]) CM/SW Contact:    Jeffory Mings, [place] Phone Number: 12/17/2023, 11:23 AM  Clinical Narrative:  Pt admitted from [facility] where he is a SNF/LTC resident and is able to return at dc. SW will follow.   Paullette Boston, MSW, LCSW [phone] (coverage)                   Expected Discharge Plan: Skilled Nursing Facility Barriers to Discharge: Continued Medical Work up   Patient Goals and CMS Choice            Expected Discharge Plan and Services     Post Acute Care Choice: Skilled Nursing Facility Living arrangements for the past 2 months: Skilled Nursing Facility                                      Prior Living Arrangements/Services Living arrangements for the past 2 months: Skilled Nursing Facility Lives with:: Facility Resident Patient language and need for interpreter reviewed:: Yes        Need for Family Participation in Patient Care: Yes (Comment) Care giver support system in place?: Yes (comment)   Criminal Activity/Legal Involvement Pertinent to Current Situation/Hospitalization: No - Comment as needed  Activities of Daily Living   ADL Screening (condition at time of admission) Independently performs ADLs?: No Does the patient have a NEW difficulty with bathing/dressing/toileting/self-feeding that is expected to last >3 days?: No Does the patient have a NEW difficulty with getting in/out of bed, walking, or climbing stairs that is expected to last >3 days?: No Does the patient have a NEW difficulty with communication that is expected to last >3 days?: No Is the patient deaf or have difficulty hearing?: No Does the patient have difficulty seeing, even when wearing glasses/contacts?: No Does the patient have difficulty concentrating, remembering, or making decisions?:  No  Permission Sought/Granted                  Emotional Assessment       Orientation: : Oriented to Self, Oriented to Place, Oriented to  Time, Oriented to Situation Alcohol  / Substance Use: Not Applicable Psych Involvement: No (comment)  Admission diagnosis:  Weakness [R53.1] Acute left-sided weakness [R53.1] Patient Active Problem List   Diagnosis Date Noted   UTI (urinary tract infection) 12/16/2023   Acute left-sided weakness 12/15/2023   COVID-19 virus infection 11/27/2023   Fever 11/27/2023   Fall from wheelchair 11/27/2023   Hyperlipidemia 11/27/2023   Acute respiratory failure with hypoxia (HCC) 10/20/2023   Influenza A with pneumonia 10/20/2023   Altered mental status 10/05/2023   MDD (major depressive disorder), recurrent severe, without psychosis (HCC) 10/05/2023   Hypoglycemia 10/03/2023   SIRS (systemic inflammatory response syndrome) (HCC) 10/03/2023   History of COPD 10/03/2023   History of seizures 10/03/2023   Multiple falls 07/21/2023   Fall 07/15/2023   Acute metabolic encephalopathy 07/14/2023   Obesity, Class III, BMI 40-49.9 (morbid obesity) 07/14/2023   History of CVA with residual deficit 07/14/2023   Fall at home, initial encounter 07/14/2023   Gout 07/12/2023   Constipation 07/09/2023   Polyneuropathy 07/04/2023   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   Acute kidney injury superimposed on stage 3a chronic kidney disease (HCC) 07/03/2023   Hypotension  07/03/2023   Stage 3a chronic kidney disease (HCC) 06/30/2023   Acute stroke due to occlusion of left cerebellar artery (HCC) 06/28/2023   Type 2 diabetes mellitus with diabetic polyneuropathy, with long-term current use of insulin  (HCC) 06/28/2023   CVA (cerebral vascular accident) (HCC) 06/25/2023   Atrial fibrillation, chronic (HCC) 06/25/2023   Acute CVA (cerebrovascular accident) (HCC) 06/25/2023   S/P TAVR (transcatheter aortic valve replacement) 02/07/2023   Acute  respiratory failure with hypoxia and hypercapnia (HCC) 01/23/2023   Nail, injury by, initial encounter 12/09/2022   Heart failure (HCC) 10/27/2022   Gout attack 10/27/2022   CHF (congestive heart failure) (HCC) 10/26/2022   Right sided weakness 05/26/2022   Dysarthria 05/25/2022   Ischemic stroke (HCC) 02/25/2022   Hemiparesis affecting left side as late effect of cerebrovascular accident (CVA) (HCC) 02/25/2022   Durable power of attorney for healthcare exists  02/25/2022   Depression 08/07/2021   Paronychia of great toe, left 07/07/2021   Stroke-like symptoms 04/27/2021   Ambulatory dysfunction 04/27/2021   Left-sided weakness 02/09/2021   Atrial fibrillation with rapid ventricular response (HCC) 02/08/2021   AF (paroxysmal atrial fibrillation) (HCC) 01/25/2021   COPD (chronic obstructive pulmonary disease) (HCC) 01/25/2021   Aortic stenosis 01/21/2021   Acquired dilation of ascending aorta and aortic root (HCC) 01/21/2021   Coronary artery disease due to lipid rich plaque    SVT (supraventricular tachycardia) (HCC)    Seizure disorder (HCC)    Primary hypertension 10/17/2020   Hypokalemia 10/17/2020   Cellulitis of right leg 12/04/2019   Pain due to onychomycosis of toenails of both feet 09/11/2019   Diabetic neuropathy (HCC) 09/11/2019   DM2 (diabetes mellitus, type 2) (HCC) 09/11/2019   Sinus tachycardia 03/07/2019   Chronic venous stasis 03/07/2019   History of anemia 12/26/2018   Anemia 08/24/2018   History of colon polyps 08/24/2018   Do not resuscitate status 08/24/2018   Chronic diastolic CHF (congestive heart failure) (HCC) 08/18/2018   Coronary artery calcification seen on CAT scan    Pure hypercholesterolemia    Chest pain    Shortness of breath    Right foot pain 01/13/2018   Acute on chronic diastolic CHF (congestive heart failure) (HCC) 01/13/2018   Type 2 diabetes mellitus with obesity (HCC) 01/13/2018   Lower extremity cellulitis 11/07/2017   Primary  osteoarthritis, left shoulder 03/05/2017   BPH without obstruction/lower urinary tract symptoms 02/22/2017   Hematuria 02/22/2017   Peripheral neuropathy 02/22/2017   Physical deconditioning 02/22/2017   Class 3 obesity    PTSD (post-traumatic stress disorder)    Anasarca 01/31/2017   Bilateral lower extremity edema 01/31/2017   OSA (obstructive sleep apnea)    PCP:  Darnelle Elders, PA-C Pharmacy:   [id number] Jonette Nestle, [place] - [address] AT [facility] OF [facility] & [address] [place] [address] Phone: [phone] Fax: [phone]     Social Drivers of Health (SDOH) Social History: SDOH Screenings   Food Insecurity: No Food Insecurity (12/15/2023)  Housing: High Risk (12/15/2023)  Transportation Needs: No Transportation Needs (12/15/2023)  Recent Concern: Transportation Needs - Unmet Transportation Needs (10/03/2023)  Utilities: Not At Risk (12/15/2023)  Alcohol  Screen: Low Risk  (06/08/2021)  Financial Resource Strain: Medium Risk (12/06/2021)   Received from [facility], [facility]  Physical Activity: Unknown (12/06/2021)   Received from [facility], [facility]  Social Connections: Socially Isolated (12/15/2023)  Stress: No Stress Concern Present (01/21/2022)  Received from [facility], [facility]  Recent Concern: Stress - Stress Concern Present (11/19/2021)   Received from [facility]  Tobacco Use: Medium Risk (12/15/2023)   SDOH Interventions:     Readmission Risk Interventions    11/28/2023    2:45 PM 07/24/2023   11:56 AM 05/08/2023    4:37 PM  Readmission Risk Prevention Plan  Transportation Screening Complete Complete Complete  Medication Review (RN Care Manager) Complete Complete Complete  PCP or Specialist appointment within 3-5 days of discharge Complete Complete Complete  HRI or Home Care Consult Complete Complete Complete  SW Recovery Care/Counseling Consult Complete Complete Complete   Palliative Care Screening Not Applicable Not Applicable Complete  Skilled Nursing Facility Complete Complete Not Applicable

## 2023-12-17 NOTE — Progress Notes (Signed)
 STROKE TEAM PROGRESS NOTE    INTERIM HISTORY/SUBJECTIVE  Doing well. No issues.   He was unable to have MRI performed as remote for his spinal stimulator could not be found.  MRI can be performed on Monday with [employer] representative is able to come in with an alternate remote.  OBJECTIVE  CBC    Component Value Date/Time   WBC 6.2 12/17/2023 0708   RBC 4.46 12/17/2023 0708   HGB 13.4 12/17/2023 0708   HCT 40.5 12/17/2023 0708   PLT 175 12/17/2023 0708   MCV 90.8 12/17/2023 0708   MCH 30.0 12/17/2023 0708   MCHC 33.1 12/17/2023 0708   RDW 13.2 12/17/2023 0708   LYMPHSABS 1.1 12/15/2023 0940   MONOABS 1.3 (H) 12/15/2023 0940   EOSABS 0.2 12/15/2023 0940   BASOSABS 0.1 12/15/2023 0940    BMET    Component Value Date/Time   NA 137 12/17/2023 0708   NA 136 02/15/2023 1415   K 3.6 12/17/2023 0708   CL 100 12/17/2023 0708   CO2 27 12/17/2023 0708   GLUCOSE 152 (H) 12/17/2023 0708   BUN 18 12/17/2023 0708   BUN 37 (H) 02/15/2023 1415   CREATININE 1.04 12/17/2023 0708   CALCIUM  8.8 (L) 12/17/2023 0708   EGFR 78 02/15/2023 1415   GFRNONAA >60 12/17/2023 0708    IMAGING past 24 hours EEG adult Result Date: 12/16/2023 Eleni Griffin, MD     12/16/2023  3:49 PM Routine EEG Report SHANAN BENNINGTON is a 68 y.o. male with a history of seizure who is undergoing an EEG to evaluate for seizures. Report: This EEG was acquired with electrodes placed according to the International 10-20 electrode system (including Fp1, Fp2, F3, F4, C3, C4, P3, P4, O1, O2, T3, T4, T5, T6, A1, A2, Fz, Cz, Pz). The following electrodes were missing or displaced: none. The occipital dominant rhythm was 6-7 Hz. This activity is reactive to stimulation. Drowsiness was manifested by background fragmentation; deeper stages of sleep were identified by K complexes and sleep spindles. There was no focal slowing. There were no interictal epileptiform discharges. There were no electrographic seizures  identified. Photic stimulation and hyperventilation were not performed. Impression and clinical correlation: This EEG was obtained while awake and asleep and is abnormal due to mild diffuse slowing indicative of global cerebral dysfunction. Epileptiform abnormalities were not seen during this recording. Greg Leaks, MD [facility] [phone] If 7pm- 7am, please page neurology on call as listed in AMION.    Vitals:   12/17/23 0345 12/17/23 0824 12/17/23 0824 12/17/23 1215  BP: (!) 129/92  135/89 139/89  Pulse: (!) 101  81 70  Resp: 19  18 18   Temp: 97.7 F (36.5 C)  97.8 F (36.6 C) (!) 97.5 F (36.4 C)  TempSrc: Oral  Oral Oral  SpO2: 94% 95% 95% 94%  Weight:      Height:         PHYSICAL EXAM General: Chronically ill-appearing elderly patient in no acute distress Psych:  Mood and affect appropriate for situation CV: Regular rate and rhythm on monitor Respiratory:  Regular, unlabored respirations on room air  NEURO:  Mental Status: AA&Ox3 Speech/Language: speech is without dysarthria or aphasia.    Cranial Nerves:  II: PERRL. Visual fields full.  III, IV, VI: EOMI. Eyelids elevate symmetrically.  V: Sensation is intact to light touch and diminished on the left VII: Face is symmetrical resting and smiling VIII: hearing intact to voice. IX, X: Phonation is normal.  UJ:[id number] shrug 5/5. XII: tongue is midline without fasciculations. Motor: Able to move bilateral upper extremities with good antigravity strength, some drift in the left arm, able to lift right lower extremity off the bed, can move left lower extremity but cannot lift it off the bed Tone: is normal and bulk is normal Sensation- Intact to light touch bilaterally. Extinction absent to light touch to DSS.   Coordination: FTN intact bilaterally Gait- deferred  Most Recent NIH  1a Level of Conscious.: 0 1b LOC Questions: 0 1c LOC Commands: 0 2 Best Gaze: 0 3 Visual: 0 4 Facial Palsy: 0 5a  Motor Arm - left: 1 5b Motor Arm - Right: 0 6a Motor Leg - Left: 3 6b Motor Leg - Right: 0 7 Limb Ataxia: 0 8 Sensory: 1 9 Best Language: 0 10 Dysarthria: 0 11 Extinct. and Inatten.: 0 TOTAL: 5   ASSESSMENT/PLAN  Mr. KNOXTON ANSLEY is a 68 y.o. male with history of anemia, anxiety, asthma, BPH, CHF, COPD, depression, neuropathy, hypertension, sleep apnea, PTSD, A-fib on Eliquis , hyperlipidemia, seizures, diabetes and stroke with residual left-sided weakness admitted after being found slumped over in his wheelchair in his facility with worsening left-sided weakness and confusion.  He was found to have a UTI and has been placed on Rocephin .  He will need MRI to determine if he has had another stroke, but this will need to be performed on Monday due to the remote to his spinal stimulator being lost.  NIH on Admission 13  Acute ischemic stroke versus recrudescence of old stroke symptoms in the setting of UTI, seizure also on the differential Code Stroke CT head No acute abnormality.  Atrophy. ASPECTS 10.    CTA head & neck pending, will order if stroke seen on MRI MRI pending, will need to be performed on Monday with [facility] Scientific rep is able to bring in remote for nerve stimulator Previous Echo 12/07/23: EF: 55-60%.  Small pericardial effusion. Medtronic aortic valve present.  LDL 79 HgbA1c 6.6 EEG shows diffuse sowing. VTE prophylaxis -fully anticoagulated with Eliquis  Eliquis  (apixaban ) daily prior to admission, now on Eliquis  (apixaban ) daily  Therapy recommendations:  SNF Disposition: Ending  Hx of Stroke/TIA Patient has a history of a stroke with left-sided weakness, details unknown  Atrial fibrillation Home Meds: Eliquis  5 mg twice daily Continue telemetry monitoring Continue anticoagulation with Eliquis   Hypertension Home meds: Spironolactone  25 mg daily Stable Maintain normotension  Hyperlipidemia Home meds: Atorvastatin  40 mg daily, increased to 80 LDL 79, goal <  70 Continue statin at discharge  Diabetes type II Controlled Home meds:  Mounjaro  15 mg weekly HgbA1c 6.6, goal < 7.0 CBGs SSI Recommend close follow-up with PCP for better DM control  Other Stroke Risk Factors Obesity, Body mass index is 43.87 kg/m., BMI >/= 30 associated with increased stroke risk, recommend weight loss, diet and exercise as appropriate  Congestive heart failure   Other Active Problems Seizure disorder-continue home Vimpat  and Keppra   Hospital day # 67  68 year old with history of CHF COPD A-fib on Eliquis  with previous stroke and left-sided weakness found to have worsening weakness on the same side concern for possible recrudescence.  Unable to get MRI due to spinal cord stimulator. Continue Eliquis  for now further recommendations after MRI is completed    Alyshia Kernan,MD     To contact Stroke Continuity provider, please refer to [email or web address]. After hours, contact General Neurology

## 2023-12-18 ENCOUNTER — Inpatient Hospital Stay (HOSPITAL_COMMUNITY)

## 2023-12-18 DIAGNOSIS — N390 Urinary tract infection, site not specified: Secondary | ICD-10-CM | POA: Diagnosis not present

## 2023-12-18 DIAGNOSIS — R531 Weakness: Secondary | ICD-10-CM | POA: Diagnosis not present

## 2023-12-18 DIAGNOSIS — I69352 Hemiplegia and hemiparesis following cerebral infarction affecting left dominant side: Secondary | ICD-10-CM | POA: Diagnosis not present

## 2023-12-18 DIAGNOSIS — Z8669 Personal history of other diseases of the nervous system and sense organs: Secondary | ICD-10-CM

## 2023-12-18 DIAGNOSIS — I878 Other specified disorders of veins: Secondary | ICD-10-CM

## 2023-12-18 DIAGNOSIS — R410 Disorientation, unspecified: Secondary | ICD-10-CM | POA: Diagnosis not present

## 2023-12-18 DIAGNOSIS — N400 Enlarged prostate without lower urinary tract symptoms: Secondary | ICD-10-CM | POA: Diagnosis not present

## 2023-12-18 DIAGNOSIS — Z952 Presence of prosthetic heart valve: Secondary | ICD-10-CM | POA: Diagnosis not present

## 2023-12-18 DIAGNOSIS — Z9682 Presence of neurostimulator: Secondary | ICD-10-CM

## 2023-12-18 DIAGNOSIS — E119 Type 2 diabetes mellitus without complications: Secondary | ICD-10-CM | POA: Diagnosis not present

## 2023-12-18 LAB — GLUCOSE, CAPILLARY
Glucose-Capillary: 140 mg/dL — ABNORMAL HIGH (ref 70–99)
Glucose-Capillary: 174 mg/dL — ABNORMAL HIGH (ref 70–99)
Glucose-Capillary: 156 mg/dL — ABNORMAL HIGH (ref 70–99)
Glucose-Capillary: 136 mg/dL — ABNORMAL HIGH (ref 70–99)

## 2023-12-18 LAB — URINE CULTURE: Culture: >=100000 — AB

## 2023-12-18 LAB — CBC WITH DIFFERENTIAL/PLATELET
Abs Immature Granulocytes: 0.03 10*3/uL (ref 0.00–0.07)
Basophils Absolute: 0 10*3/uL (ref 0.0–0.1)
Basophils Relative: 1 %
Eosinophils Absolute: 0.6 10*3/uL — ABNORMAL HIGH (ref 0.0–0.5)
Eosinophils Relative: 13 %
HCT: 42.2 % (ref 39.0–52.0)
Hemoglobin: 14.1 g/dL (ref 13.0–17.0)
Immature Granulocytes: 1 %
Lymphocytes Relative: 32 %
Lymphs Abs: 1.5 10*3/uL (ref 0.7–4.0)
MCH: 29.7 pg (ref 26.0–34.0)
MCHC: 33.4 g/dL (ref 30.0–36.0)
MCV: 89 fL (ref 80.0–100.0)
Monocytes Absolute: 0.8 10*3/uL (ref 0.1–1.0)
Monocytes Relative: 17 %
Neutro Abs: 1.6 10*3/uL — ABNORMAL LOW (ref 1.7–7.7)
Neutrophils Relative %: 36 %
Platelets: 195 10*3/uL (ref 150–400)
RBC: 4.74 MIL/uL (ref 4.22–5.81)
RDW: 13.1 % (ref 11.5–15.5)
WBC: 4.5 10*3/uL (ref 4.0–10.5)
nRBC: 0 % (ref 0.0–0.2)

## 2023-12-18 LAB — BASIC METABOLIC PANEL WITH GFR
Anion gap: 12 (ref 5–15)
BUN: 17 mg/dL (ref 8–23)
CO2: 25 mmol/L (ref 22–32)
Calcium: 9.2 mg/dL (ref 8.9–10.3)
Chloride: 100 mmol/L (ref 98–111)
Creatinine, Ser: 1.09 mg/dL (ref 0.61–1.24)
GFR, Estimated: >60 mL/min (ref >60)
Glucose, Bld: 142 mg/dL — ABNORMAL HIGH (ref 70–99)
Potassium: 3.9 mmol/L (ref 3.5–5.1)
Sodium: 137 mmol/L (ref 135–145)

## 2023-12-18 LAB — HEMOGLOBIN A1C
Hgb A1c MFr Bld: 7 % — ABNORMAL HIGH (ref 4.8–5.6)
Mean Plasma Glucose: 154 mg/dL

## 2023-12-18 LAB — AMMONIA: Ammonia: 22 umol/L (ref 9–35)

## 2023-12-18 MED ORDER — IOHEXOL 350 MG/ML SOLN
75.0000 mL | Freq: Once | INTRAVENOUS | Status: AC | PRN
  Administered 2023-12-18: 75 mL via INTRAVENOUS

## 2023-12-18 NOTE — Progress Notes (Signed)
 VAST consult received to obtain IV access for antibiotic administration. Spoke with patient's nurse who stated she was able to administer full morning dose before she had to dc IV. New IV is needed for antibiotic dose due 12/19/23 at 0900. Pt does not have any other IV meds/fluids ordered. Discussed and in agreement to place IV closer to medication due time. If IV is NEEDED sooner, IV team consult can be placed at that time.

## 2023-12-18 NOTE — Progress Notes (Addendum)
 STROKE TEAM PROGRESS NOTE    INTERIM HISTORY/SUBJECTIVE  No family at the bedside.  RN is at the bedside.  No new neurological events overnight MRI brain and CT angio head and neck are ordered  CBC    Component Value Date/Time   WBC 4.5 12/18/2023 0717   RBC 4.74 12/18/2023 0717   HGB 14.1 12/18/2023 0717   HCT 42.2 12/18/2023 0717   PLT 195 12/18/2023 0717   MCV 89.0 12/18/2023 0717   MCH 29.7 12/18/2023 0717   MCHC 33.4 12/18/2023 0717   RDW 13.1 12/18/2023 0717   LYMPHSABS 1.5 12/18/2023 0717   MONOABS 0.8 12/18/2023 0717   EOSABS 0.6 (H) 12/18/2023 0717   BASOSABS 0.0 12/18/2023 0717    BMET    Component Value Date/Time   NA 137 12/18/2023 0717   NA 136 02/15/2023 1415   K 3.9 12/18/2023 0717   CL 100 12/18/2023 0717   CO2 25 12/18/2023 0717   GLUCOSE 142 (H) 12/18/2023 0717   BUN 17 12/18/2023 0717   BUN 37 (H) 02/15/2023 1415   CREATININE 1.09 12/18/2023 0717   CALCIUM  9.2 12/18/2023 0717   EGFR 78 02/15/2023 1415   GFRNONAA >60 12/18/2023 0717    IMAGING past 24 hours No results found.   Vitals:   12/18/23 0435 12/18/23 0810 12/18/23 0830 12/18/23 1146  BP: 129/88 120/73  133/81  Pulse: 77 (!) 47 65 83  Resp: 19 16  18   Temp: 97.7 F (36.5 C) (!) 97.4 F (36.3 C)  97.9 F (36.6 C)  TempSrc: Oral Oral  Oral  SpO2: 96% 95% 95% 94%  Weight:      Height:         PHYSICAL EXAM General: Chronically ill-appearing elderly patient in no acute distress Psych:  Mood and affect appropriate for situation CV: Regular rate and rhythm on monitor Respiratory:  Regular, unlabored respirations on room air  NEURO:  Mental Status: AA&Ox3 Speech/Language: speech is without dysarthria or aphasia.    Cranial Nerves:  II: PERRL. Visual fields full.  III, IV, VI: EOMI. Eyelids elevate symmetrically.  V: Sensation is intact to light touch and diminished on the left VII: Face is symmetrical resting and smiling VIII: hearing intact to voice. IX, X: Phonation  is normal.  BM:[id number] shrug 5/5. XII: tongue is midline without fasciculations. Motor: Able to move bilateral upper extremities with good antigravity strength, some drift in the left arm, able to lift right lower extremity off the bed, can move left lower extremity but cannot lift it off the bed Tone: is normal and bulk is normal Sensation- subjective decreased sensation on left.  Extinction absent to light touch to DSS.   Coordination: FTN intact bilaterally Gait- deferred  Most Recent NIH  1a Level of Conscious.: 0 1b LOC Questions: 0 1c LOC Commands: 0 2 Best Gaze: 0 3 Visual: 0 4 Facial Palsy: 0 5a Motor Arm - left: 1 5b Motor Arm - Right: 0 6a Motor Leg - Left: 3 6b Motor Leg - Right: 0 7 Limb Ataxia: 0 8 Sensory: 1 9 Best Language: 0 10 Dysarthria: 0 11 Extinct. and Inatten.: 0 TOTAL: 5   ASSESSMENT/PLAN  Lawrence [name] is a 68 y.o. male with history of anemia, anxiety, asthma, BPH, CHF, COPD, depression, neuropathy, hypertension, sleep apnea, PTSD, A-fib on Eliquis , hyperlipidemia, seizures, diabetes and stroke with residual left-sided weakness admitted after being found slumped over in his wheelchair in his facility with worsening left-sided weakness and confusion.  He was found to have a UTI and has been placed on Rocephin .  He will need MRI to determine if he has had another stroke, but this will need to be performed on Monday due to the remote to his spinal stimulator being lost.  NIH on Admission 13  Acute ischemic stroke versus recrudescence of old stroke symptoms in the setting of UTI, seizure also on the differential Code Stroke CT head No acute abnormality.  Atrophy. ASPECTS 10.    CTA head & neck pending MRI pending, will need to be performed with [facility] Scientific rep who is able to bring in remote for nerve stimulator Echo EF: 55-60%. Medtronic aortic valve present.  LDL 79 HgbA1c 6.6 EEG shows diffuse sowing. VTE prophylaxis - fully anticoagulated  with Eliquis  Eliquis  (apixaban ) daily prior to admission, now on Eliquis  (apixaban ) daily  Therapy recommendations:  SNF Disposition: Ending  Hx of Stroke/TIA 06/2023 admitted for left cerebellar punctate infarct.  EF 50 to 55%.  LDL 79, A1c 6.4, continue on Eliquis  at discharge  History of seizure-like activity 10/2020 admitted for CHF, SOB and LLE cellulitis.  Reported seizure-like activity lasting 30 seconds with bilateral upper extremity jerking.  MRI/EEG negative.  Put on Keppra  1 g twice daily. Keppra  continued for 1 months and self stopped 02/2021 admitted for left LE weakness and lethargy.  MRI showed no acute infarct.  MRI C-spine showed spinal stenosis, no spinal cord compression.  Discharged on outpatient PT/OT with plan for EMS/NCS as outpatient. 04/2021 to follow-up with Dr. Samara Crest at [facility] 04/2021 admitted for headache and left-sided weakness.  MRI and EEG negative. 08/2021 admitted for right hand jerking EEG negative.  TEE no PFO.  Put on Keppra  1 g twice daily 09/2021 follow with Dr. Samara Crest, decrease her Keppra  XR 1 g daily 02/2022 admitted for left-sided weakness/numbness and headache.  CT no acute abnormality.  CT head and neck unremarkable.  MRI no acute infarct.  EF 60 to 65%, EEG negative.  LDL 62, A1c 8.0.  Concerning?  Partial seizure versus recrudescence from previous stroke.  Discharged on Keppra  500 twice daily and Vimpat  150 twice daily as well as aspirin  81 and Eliquis . 05/2022 presented with right-sided weakness and dysarthria.  CT head and neck no LVO, MRI negative.  EEG normal.  Continue on Eliquis , considered recrudescence of old deficit in the setting of encephalopathy and hypotension. 07/2023 admitted for altered mental status, MRI and EEG negative. Follows with Dr. Samara Crest at [facility] Continue home Keppra  and Vimpat   Atrial fibrillation Home Meds: Eliquis  5 mg twice daily Continue telemetry monitoring Continue anticoagulation with Eliquis   Hypertension Home meds:  Spironolactone  25 mg daily Stable Maintain normotension  Hyperlipidemia Home meds: Atorvastatin  40 mg daily LDL 79, goal < 70 Increased Lipitor to 80. Continue statin at discharge  Diabetes type II Controlled Home meds:  Mounjaro  15 mg weekly HgbA1c 6.6, goal < 7.0 CBGs SSI Recommend close follow-up with PCP for better DM control  Other Stroke Risk Factors Obesity, Body mass index is 43.87 kg/m., BMI >/= 30 associated with increased stroke risk, recommend weight loss, diet and exercise as appropriate  Congestive heart failure OSA  Other Active Problems Status post TAVR UTI, UA WBC more than 50, on Rocephin   Hospital day # 2   Jonette Nestle DNP, ACNPC-AG  [facility]  ATTENDING NOTE: I reviewed above note and agree with the assessment and plan. Pt was seen and examined.   Wife at the bedside. Pt lying in bed, awake, alert,  eyes open, orientated to age, place, time and people. No aphasia, following all simple commands. Able to name and repeat. No gaze palsy, tracking bilaterally, visual field full. No facial droop. Tongue midline. RUE 4+/5 and LUE 4-/5, RLE 3/5 and LLE 3-/5. Sensation symmetrical at face and arms but decreased on the left LE, b/l FTN intact, gait not tested.   For detailed assessment and plan, please refer to above as I have made changes wherever appropriate.   I spent extensive face-to-face time with the patient, more than 50% of which was spent in counseling and coordination of care, reviewing test results, images and medication, and discussing the diagnosis, treatment plan and potential prognosis. This patient's care requiresreview of multiple databases, neurological assessment, discussion with family, other specialists and medical decision making of high complexity.  Consuelo Denmark, MD PhD Stroke Neurology 12/18/2023 3:10 PM     To contact Stroke Continuity provider, please refer to [email or web address]. After hours, contact General Neurology

## 2023-12-18 NOTE — TOC Progression Note (Addendum)
 Transition of Care [facility]) - Progression Note    Patient Details  Name: Lawrence [name] MRN: [id number] Date of Birth: 1956/08/04  Transition of Care [facility] CM/SW Contact  Tandy Fam, [place] Phone Number: 12/18/2023, 1:22 PM  Clinical Narrative:   CSW spoke with [name] to discuss patient's spinal stimulator remote and if they have it at [facility]. [name] to check and will update CSW.  UPDATE: CSW spoke with [facility] and they have no record of having the patient's spinal stimulator remote, it was not brought to the SNF. [name] unable to assist.    Expected Discharge Plan: Skilled Nursing Facility Barriers to Discharge: Continued Medical Work up  Expected Discharge Plan and Services     Post Acute Care Choice: Skilled Nursing Facility Living arrangements for the past 2 months: Skilled Nursing Facility                                       Social Determinants of Health (SDOH) Interventions SDOH Screenings   Food Insecurity: No Food Insecurity (12/15/2023)  Housing: High Risk (12/15/2023)  Transportation Needs: No Transportation Needs (12/15/2023)  Recent Concern: Transportation Needs - Unmet Transportation Needs (10/03/2023)  Utilities: Not At Risk (12/15/2023)  Alcohol  Screen: Low Risk  (06/08/2021)  Financial Resource Strain: Medium Risk (12/06/2021)   Received from [facility], [facility]  Physical Activity: Unknown (12/06/2021)   Received from [facility], [facility]  Social Connections: Socially Isolated (12/15/2023)  Stress: No Stress Concern Present (01/21/2022)   Received from [facility], [facility]  Recent Concern: Stress - Stress Concern Present (11/19/2021)   Received from [facility]  Tobacco Use: Medium Risk (12/15/2023)    Readmission Risk Interventions    11/28/2023    2:45 PM 07/24/2023   11:56 AM 05/08/2023    4:37 PM  Readmission Risk Prevention Plan  Transportation Screening Complete Complete Complete  Medication  Review Oceanographer) Complete Complete Complete  PCP or Specialist appointment within 3-5 days of discharge Complete Complete Complete  HRI or Home Care Consult Complete Complete Complete  SW Recovery Care/Counseling Consult Complete Complete Complete  Palliative Care Screening Not Applicable Not Applicable Complete  Skilled Nursing Facility Complete Complete Not Applicable

## 2023-12-18 NOTE — Progress Notes (Signed)
 PROGRESS NOTE    Lawrence [name]  ZOX:[id number] DOB: June 27, 1956 DOA: 12/15/2023 PCP: Darnelle Elders, PA-C    Chief Complaint  Patient presents with   Code Stroke    Brief Narrative:  Patient 68 year old gentleman history of COPD, severe aortic stenosis status post TAVR July 2024, seizure disorder, chronic diastolic CHF, A-fib on Eliquis , CVA, gout, OSA on CPAP, anxiety, BPH, chronic venous stasis, morbid obesity presenting with worsening left-sided weakness from his baseline, slurred speech concern for acute CVA.  CT head done unremarkable.  Patient seen in consultation by neurology who recommended MRI of the brain.  Patient however with a spinal cord stimulator, wife trying to locate remote and as such MRI unable to to be done yet.  Urinalysis concerning for UTI patient placed on IV antibiotics.   Assessment & Plan:   Principal Problem:   Acute left-sided weakness Active Problems:   Type 2 diabetes mellitus with obesity (HCC)   COPD (chronic obstructive pulmonary disease) (HCC)   AF (paroxysmal atrial fibrillation) (HCC)   Chronic diastolic CHF (congestive heart failure) (HCC)   S/P TAVR (transcatheter aortic valve replacement)   Seizure disorder (HCC)   Class 3 obesity   OSA (obstructive sleep apnea)   Obesity, Class III, BMI 40-49.9 (morbid obesity)   PTSD (post-traumatic stress disorder)   BPH without obstruction/lower urinary tract symptoms   Peripheral neuropathy   Physical deconditioning   Pure hypercholesterolemia   Chronic venous stasis   Diabetic neuropathy (HCC)   DM2 (diabetes mellitus, type 2) (HCC)   Primary hypertension   Coronary artery disease due to lipid rich plaque   Stroke-like symptoms   Depression   Stage 3a chronic kidney disease (HCC)   MDD (major depressive disorder), recurrent severe, without psychosis (HCC)   UTI (urinary tract infection)   Drowsy  #1 left-sided weakness: Acute ischemic CVA versus recrudescence of prior CVA in the setting  of UTI -Patient with prior history of CVA with some residual left-sided weakness presenting with sudden onset worsening left-sided weakness from baseline, slurred speech, concern for possible facial droop. - Patient brought in as a code stroke. - CT head done negative for any acute abnormalities. -Chest x-ray done negative for any acute abnormalities. -Urinalysis pending. - Patient seen in consultation by neurology who recommended MRI of the brain without contrast and if negative no further workup needed. -Patient with spinal cord stimulator however wife unable to locate remote. -Patient seen by neurology and recommending MRI to be done which will likely be done today, 12/18/2023, when [employer] is able to bring in remote for nerve stimulator per neurology. -EEG done with mild diffuse slowing indicative of global cerebral dysfunction.  Epileptiform abnormalities not seen. -Continue home regimen Lipitor, Tricor , Lovaza, Eliquis . -PT/OT. -Likely needs SNF. -Neurology following and I appreciate their input and recommendations.   2.  A-fib - Currently rate controlled.  - Patient noted not on rate limiting medications prior to admission.  - Continue Eliquis  for anticoagulation.  3.  UTI -Urine cultures > 100,000 colonies of Klebsiella pneumonia. - Continue IV Rocephin . - Could likely transition to oral antibiotics in the next 24 hours if continued improvement.   4.  Diabetes mellitus type 2 -Hemoglobin A1c noted at 6.6 on 10/04/2023. - CBG noted at 140 this morning.   - SSI.     5.  Depression//anxiety - Continue home regimen Abilify , Effexor . -Decreased BuSpar  to 5 mg 3 times daily as per wife and RN patient with some bouts of sleepiness  intermittently. -Outpatient follow-up.   6.  Chronic diastolic heart failure/CAD/status post TAVR - Currently stable. -Euvolemic on examination. - Continue home cardiac regimen of Lipitor, spironolactone , Tricor , Lovaza,  Zaroxolyn , Demadex .   7.  CKD stage IIIa -Stable.   8.  Seizure disorder -Stable. - EEG obtained negative for any epileptiform discharges noted.  -Continue home regimen Keppra , lacosamide . -Neurology following.   9.  COPD - Continue home regimen Incruse Ellipta .   - Dulera.     10.  Obesity class III -Lifestyle modification - Outpatient follow-up with PCP.   11.  OSA -CPAP nightly.    #12 drowsiness -Patient noted with intermittent bouts of sleepiness and drowsiness however easily arousable and following commands. -Likely secondary to polypharmacy. -Patient with clinical improvement after adjustment of some of his home medications.  -Home regimen BuSpar  has been decreased to 5 mg 3 times daily. - Remeron  discontinued.  -Trazodone  has been decreased to 150 mg nightly which we will continue for now. -Ammonia levels were in normal limits. -Supportive care.     DVT prophylaxis: Eliquis  Code Status: DNR Family Communication: Updated patient and wife at bedside. Disposition: Likely back to nursing home once cleared by neurology.  Status is: Inpatient The patient will require care spanning > 2 midnights and should be moved to inpatient because: Severity of illness   Consultants:  Neurology: Dr. Murvin Arthurs 12/15/2023  Procedures:  CT head 12/15/2023 Chest x-ray 12/15/2023 EEG  Antimicrobials:  Anti-infectives (From admission, onward)    Start     Dose/Rate Route Frequency Ordered Stop   12/16/23 0900  cefTRIAXone  (ROCEPHIN ) 2 g in sodium chloride  0.9 % 100 mL IVPB        2 g 200 mL/hr over 30 Minutes Intravenous Every 24 hours 12/16/23 0831     12/15/23 1515  linezolid  (ZYVOX ) tablet 600 mg  Status:  Discontinued        600 mg Oral 2 times daily 12/15/23 1508 12/15/23 1519         Subjective: More alert, eating lunch, wife at bedside.  Denies any chest pain or shortness of breath.  No abdominal pain.  Feels left-sided weakness slowly improving.  Objective: Vitals:    12/18/23 0340 12/18/23 0435 12/18/23 0810 12/18/23 0830  BP:  129/88 120/73   Pulse:  77 (!) 47 65  Resp: (!) 22 19 16    Temp:  97.7 F (36.5 C) (!) 97.4 F (36.3 C)   TempSrc:  Oral Oral   SpO2:  96% 95% 95%  Weight:      Height:        Intake/Output Summary (Last 24 hours) at 12/18/2023 1138 Last data filed at 12/18/2023 0655 Gross per 24 hour  Intake 500 ml  Output 3600 ml  Net -3100 ml   Filed Weights   12/15/23 1018  Weight: (!) 155 kg    Examination:  General exam: NAD.  Alert.  Respiratory system: Lungs clear to auscultation bilaterally anterior lung fields.  No wheezes, no crackles, no rhonchi.  Fair air movement.  Speaking in full sentences.  Cardiovascular system: RRR no murmurs rubs or gallops.  No JVD.  No lower extremity edema. Gastrointestinal system: Abdomen is soft, nontender, nondistended, positive bowel sounds.  No rebound.  No guarding.  Central nervous system: Alert and oriented.  Moving extremities spontaneously.  No focal neurological deficits. Extremities: Symmetric 5 x 5 power. Skin: No rashes, lesions or ulcers Psychiatry: Judgement and insight appear normal. Mood & affect appropriate.  Data Reviewed: I have personally reviewed following labs and imaging studies  CBC: Recent Labs  Lab 12/15/23 0940 12/15/23 0943 12/16/23 0737 12/17/23 0708 12/18/23 0717  WBC 13.6*  --  9.3 6.2 4.5  NEUTROABS 11.0*  --   --   --  1.6*  HGB 13.9 14.6 13.5 13.4 14.1  HCT 40.7 43.0 41.3 40.5 42.2  MCV 88.7  --  90.6 90.8 89.0  PLT 207  --  199 175 195    Basic Metabolic Panel: Recent Labs  Lab 12/15/23 0940 12/15/23 0943 12/16/23 0737 12/17/23 0708 12/18/23 0717  NA 133* 133* 137 137 137  K 3.8 3.7 3.7 3.6 3.9  CL 95* 94* 97* 100 100  CO2 26  --  26 27 25   GLUCOSE 249* 242* 214* 152* 142*  BUN 21 24* 18 18 17   CREATININE 1.26* 1.20 1.13 1.04 1.09  CALCIUM  8.9  --  8.8* 8.8* 9.2  MG  --   --  1.9  --   --   PHOS  --   --  3.0  --   --      GFR: Estimated Creatinine Clearance: 104.9 mL/min (by C-G formula based on SCr of 1.09 mg/dL).  Liver Function Tests: Recent Labs  Lab 12/15/23 0940  AST 16  ALT 22  ALKPHOS 56  BILITOT 0.9  PROT 6.7  ALBUMIN 3.4*    CBG: Recent Labs  Lab 12/17/23 0640 12/17/23 1216 12/17/23 1618 12/17/23 2115 12/18/23 0638  GLUCAP 147* 144* 130* 138* 140*     Recent Results (from the past 240 hours)  Urine Culture (for pregnant, neutropenic or urologic patients or patients with an indwelling urinary catheter)     Status: Abnormal   Collection Time: 12/15/23  3:10 PM   Specimen: Urine, Catheterized  Result Value Ref Range Status   Specimen Description URINE, CATHETERIZED  Final   Special Requests   Final    NONE Performed at [facility], [address] [place], [place] [address]    Culture >=100,000 COLONIES/mL KLEBSIELLA PNEUMONIAE (A)  Final   Report Status 12/18/2023 FINAL  Final   Organism ID, Bacteria KLEBSIELLA PNEUMONIAE (A)  Final      Susceptibility   Klebsiella pneumoniae - MIC*    AMPICILLIN  >=32 RESISTANT Resistant     CEFAZOLIN  <=4 SENSITIVE Sensitive     CEFEPIME  <=0.12 SENSITIVE Sensitive     CEFTRIAXONE  <=0.25 SENSITIVE Sensitive     CIPROFLOXACIN <=0.25 SENSITIVE Sensitive     GENTAMICIN <=1 SENSITIVE Sensitive     IMIPENEM <=0.25 SENSITIVE Sensitive     NITROFURANTOIN 64 INTERMEDIATE Intermediate     TRIMETH /SULFA  <=20 SENSITIVE Sensitive     AMPICILLIN /SULBACTAM 8 SENSITIVE Sensitive     PIP/TAZO 8 SENSITIVE Sensitive ug/mL    * >=100,000 COLONIES/mL KLEBSIELLA PNEUMONIAE         Radiology Studies: EEG adult Result Date: 12/16/2023 Eleni Griffin, MD     12/16/2023  3:49 PM Routine EEG Report Lawrence [name] is a 68 y.o. male with a history of seizure who is undergoing an EEG to evaluate for seizures. Report: This EEG was acquired with electrodes placed according to the International 10-20 electrode system (including Fp1, Fp2, F3, F4,  C3, C4, P3, P4, O1, O2, T3, T4, T5, T6, A1, A2, Fz, Cz, Pz). The following electrodes were missing or displaced: none. The occipital dominant rhythm was 6-7 Hz. This activity is reactive to stimulation. Drowsiness was manifested by background fragmentation; deeper  stages of sleep were identified by K complexes and sleep spindles. There was no focal slowing. There were no interictal epileptiform discharges. There were no electrographic seizures identified. Photic stimulation and hyperventilation were not performed. Impression and clinical correlation: This EEG was obtained while awake and asleep and is abnormal due to mild diffuse slowing indicative of global cerebral dysfunction. Epileptiform abnormalities were not seen during this recording. Greg Leaks, MD [facility] [phone] If 7pm- 7am, please page neurology on call as listed in AMION.        Scheduled Meds:  allopurinol   100 mg Oral Daily   apixaban   5 mg Oral BID   ARIPiprazole   5 mg Oral Daily   atorvastatin   80 mg Oral QHS   busPIRone   5 mg Oral TID   colchicine   0.6 mg Oral Daily   cyanocobalamin   1,000 mcg Oral Daily   fenofibrate   54 mg Oral Daily   ferrous sulfate   325 mg Oral Daily   fluticasone   2 spray Each Nare Daily   fluticasone  furoate-vilanterol  1 puff Inhalation Daily   insulin  aspart  0-9 Units Subcutaneous TID WC   lacosamide   150 mg Oral BID   levETIRAcetam   500 mg Oral BID   [START ON 12/19/2023] metolazone   2.5 mg Oral Weekly   midodrine   5 mg Oral TID WC   omega-3 acid ethyl esters  2 g Oral BID   pantoprazole   40 mg Oral Daily   polyethylene glycol  17 g Oral Daily   potassium chloride   40 mEq Oral q AM   And   potassium chloride   20 mEq Oral QPM   prazosin   2 mg Oral QHS   pregabalin   150 mg Oral TID   rOPINIRole   0.25 mg Oral BID   senna-docusate  2 tablet Oral BID   sodium chloride  flush  3 mL Intravenous Once   sodium chloride  flush  3 mL Intravenous Q12H   spironolactone   12.5 mg  Oral Daily   torsemide   20 mg Oral BID   traZODone   150 mg Oral QHS   umeclidinium bromide   1 puff Inhalation Daily   venlafaxine  XR  150 mg Oral Q breakfast   Continuous Infusions:  cefTRIAXone  (ROCEPHIN )  IV 2 g (12/18/23 0826)     LOS: 2 days    Time spent: 40 minutes    Hilda Lovings, MD [facility]   To contact the attending provider between 7A-7P or the covering provider during after hours 7P-7A, please log into the web site [email or web address] and access using universal [place] password for that web site. If you do not have the password, please call the hospital operator.  12/18/2023, 11:38 AM

## 2023-12-18 NOTE — Progress Notes (Signed)
 Occupational Therapy Treatment Patient Details Name: Lawrence [name] MRN: [id number] DOB: 12-Jul-1956 Today's Date: 12/18/2023   History of present illness Pt is a 68 yo male presenting to [facility] ED on 12/15/23 with worsening L sided weakness and slurred speech. Awaiting MRI (pending being able to turn off his spinal stimulator). Pt was recently admitted on 11/27/23 after and unwitnessed fall found to be hypotensive and + for Covid 19. PMH of COPD, severe aortic stenosis s/p TAVR (2024), seizure disorder, CHF, Afib, hx of CVA, OSA, BPH, obesity.   OT comments  Pt with improving use of LUE. Issued built up tubing to assist with grip for L hand. Educated pt/s.o on use of Theraputty and T band for LUE strengthening. Pt familiar with exercises as he completes them on his own. No further acute OT needs. Pt is a long term resident of [facility] - Pt to continue with OT as needed at [facility]. Acute OT signing off.       If plan is discharge home, recommend the following:   (NA)   Equipment Recommendations  None recommended by OT    Recommendations for Other Services      Precautions / Restrictions Precautions Precautions: Fall       Mobility Bed Mobility                    Transfers                   General transfer comment: lift equipment needed     Balance                                           ADL either performed or assessed with clinical judgement   ADL Overall ADL's : Needs assistance/impaired Eating/Feeding: Set up;Sitting;Bed level (issued red tubing)   Grooming: Set up;Oral care;Bed level Grooming Details (indicate cue type and reason): brushed teeth using tubing on toohbrush Upper Body Bathing: Moderate assistance                                  Extremity/Trunk Assessment Upper Extremity Assessment LUE Deficits / Details: AROM overall WFL through full range with effort; inconsistent movemetn at times; gross  grasp/release; able to manipulate toothpaste top and pass items from 1 hand to the other; movement pattern appears smoother with automatic movements; encouraged to use functionally; L inattention noted LUE Coordination: decreased fine motor;decreased gross motor   Lower Extremity Assessment Lower Extremity Assessment: Defer to PT evaluation        Vision   Additional Comments: vision deficits at baseline   Perception     Praxis     Communication     Cognition Arousal: Alert Behavior During Therapy: WFL for tasks assessed/performed Cognition: History of cognitive impairments (most likely baseline)                               Following commands: Intact        Cueing      Exercises General Exercises - Upper Extremity Shoulder Flexion: Left, AROM, Strengthening, 10 reps, Seated, Theraband Theraband Level (Shoulder Flexion): Level 1 (Yellow) Elbow Flexion: Left, 10 reps, Strengthening, Theraband Theraband Level (Elbow Flexion): Level 1 (Yellow) Elbow Extension: Strengthening, Left, 10 reps,  Seated, Theraband Theraband Level (Elbow Extension): Level 1 (Yellow) Other Exercises Other Exercises: squeeze foam - red Other Exercises: yellow putty    Shoulder Instructions       General Comments      Pertinent Vitals/ Pain       Pain Assessment Pain Assessment: Faces Faces Pain Scale: Hurts a little bit Pain Location: R foot (dorsal) Pain Descriptors / Indicators: Constant, Grimacing, Guarding, Discomfort Pain Intervention(s): Limited activity within patient's tolerance  Home Living                                          Prior Functioning/Environment              Frequency  Min 2X/week        Progress Toward Goals  OT Goals(current goals can now be found in the care plan section)  Progress towards OT goals: Goals met/education completed, patient discharged from OT (appropriate for DC)  Acute Rehab OT Goals Patient  Stated Goal: DC to [facility] OT Goal Formulation: With patient Time For Goal Achievement: 12/30/23 Potential to Achieve Goals: Good ADL Goals Pt Will Perform Grooming: sitting;with min assist Pt Will Perform Upper Body Bathing: sitting;with min assist Pt Will Perform Lower Body Bathing: with min assist;sitting/lateral leans;bed level;with adaptive equipment Pt Will Perform Upper Body Dressing: sitting;with min assist Pt Will Perform Lower Body Dressing: with min assist;with adaptive equipment;sitting/lateral leans;bed level Pt/caregiver will Perform Home Exercise Program: Increased strength;Increased ROM;Right Upper extremity;Both right and left upper extremity;With written HEP provided;Independently  Plan      Co-evaluation                 AM-PAC OT "6 Clicks" Daily Activity     Outcome Measure   Help from another person eating meals?: A Little Help from another person taking care of personal grooming?: A Little Help from another person toileting, which includes using toliet, bedpan, or urinal?: Total Help from another person bathing (including washing, rinsing, drying)?: A Lot Help from another person to put on and taking off regular upper body clothing?: A Lot Help from another person to put on and taking off regular lower body clothing?: Total 6 Click Score: 12    End of Session    OT Visit Diagnosis: Unsteadiness on feet (R26.81);Other abnormalities of gait and mobility (R26.89);Muscle weakness (generalized) (M62.81);Hemiplegia and hemiparesis;Pain Hemiplegia - Right/Left: Left Hemiplegia - dominant/non-dominant: Non-Dominant Pain - part of body: Ankle and joints of foot   Activity Tolerance Patient tolerated treatment well   Patient Left in bed;with call bell/phone within reach;with bed alarm set;with family/visitor present   Nurse Communication Need for lift equipment        Time: 1914-7829 OT Time Calculation (min): 21 min  Charges: OT General  Charges $OT Visit: 1 Visit OT Treatments $Self Care/Home Management : 8-22 mins  Milburn Aliment, OT/L   Acute OT Clinical Specialist Acute Rehabilitation Services Pager [phone] Office [phone]   [facility] 12/18/2023, 1:28 PM

## 2023-12-18 NOTE — Progress Notes (Signed)

## 2023-12-19 ENCOUNTER — Inpatient Hospital Stay (HOSPITAL_COMMUNITY)

## 2023-12-19 DIAGNOSIS — I69352 Hemiplegia and hemiparesis following cerebral infarction affecting left dominant side: Secondary | ICD-10-CM | POA: Diagnosis not present

## 2023-12-19 DIAGNOSIS — R410 Disorientation, unspecified: Secondary | ICD-10-CM | POA: Diagnosis not present

## 2023-12-19 DIAGNOSIS — N400 Enlarged prostate without lower urinary tract symptoms: Secondary | ICD-10-CM | POA: Diagnosis not present

## 2023-12-19 DIAGNOSIS — N390 Urinary tract infection, site not specified: Secondary | ICD-10-CM | POA: Diagnosis not present

## 2023-12-19 DIAGNOSIS — R531 Weakness: Secondary | ICD-10-CM | POA: Diagnosis not present

## 2023-12-19 DIAGNOSIS — E119 Type 2 diabetes mellitus without complications: Secondary | ICD-10-CM | POA: Diagnosis not present

## 2023-12-19 DIAGNOSIS — Z952 Presence of prosthetic heart valve: Secondary | ICD-10-CM | POA: Diagnosis not present

## 2023-12-19 LAB — BASIC METABOLIC PANEL WITH GFR
Anion gap: 9 (ref 5–15)
BUN: 20 mg/dL (ref 8–23)
CO2: 25 mmol/L (ref 22–32)
Calcium: 9.3 mg/dL (ref 8.9–10.3)
Chloride: 102 mmol/L (ref 98–111)
Creatinine, Ser: 1.05 mg/dL (ref 0.61–1.24)
GFR, Estimated: >60 mL/min (ref >60)
Glucose, Bld: 133 mg/dL — ABNORMAL HIGH (ref 70–99)
Potassium: 3.5 mmol/L (ref 3.5–5.1)
Sodium: 136 mmol/L (ref 135–145)

## 2023-12-19 LAB — GLUCOSE, CAPILLARY
Glucose-Capillary: 121 mg/dL — ABNORMAL HIGH (ref 70–99)
Glucose-Capillary: 129 mg/dL — ABNORMAL HIGH (ref 70–99)
Glucose-Capillary: 171 mg/dL — ABNORMAL HIGH (ref 70–99)
Glucose-Capillary: 170 mg/dL — ABNORMAL HIGH (ref 70–99)

## 2023-12-19 MED ORDER — LORAZEPAM 2 MG/ML IJ SOLN
1.0000 mg | Freq: Every day | INTRAMUSCULAR | Status: DC | PRN
  Filled 2023-12-19: qty 1

## 2023-12-19 MED ORDER — CEFADROXIL 500 MG PO CAPS
1000.0000 mg | ORAL_CAPSULE | Freq: Two times a day (BID) | ORAL | Status: DC
  Administered 2023-12-19 – 2023-12-20 (×3): 1000 mg via ORAL
  Filled 2023-12-19 (×4): qty 2

## 2023-12-19 NOTE — Progress Notes (Addendum)
 STROKE TEAM PROGRESS NOTE    INTERIM HISTORY/SUBJECTIVE Family at the bedside.  Patient is laying in the bed in no apparent distress.  He is a little bit drowsy this morning.  He complains he was not able to sleep last night.  No new neurological events did CTA head and neck with no LVO,Mild bilateral carotid bifurcation atherosclerosis  MRI brain is ordered  CBC    Component Value Date/Time   WBC 4.5 12/18/2023 0717   RBC 4.74 12/18/2023 0717   HGB 14.1 12/18/2023 0717   HCT 42.2 12/18/2023 0717   PLT 195 12/18/2023 0717   MCV 89.0 12/18/2023 0717   MCH 29.7 12/18/2023 0717   MCHC 33.4 12/18/2023 0717   RDW 13.1 12/18/2023 0717   LYMPHSABS 1.5 12/18/2023 0717   MONOABS 0.8 12/18/2023 0717   EOSABS 0.6 (H) 12/18/2023 0717   BASOSABS 0.0 12/18/2023 0717    BMET    Component Value Date/Time   NA 136 12/19/2023 0607   NA 136 02/15/2023 1415   K 3.5 12/19/2023 0607   CL 102 12/19/2023 0607   CO2 25 12/19/2023 0607   GLUCOSE 133 (H) 12/19/2023 0607   BUN 20 12/19/2023 0607   BUN 37 (H) 02/15/2023 1415   CREATININE 1.05 12/19/2023 0607   CALCIUM  9.3 12/19/2023 0607   EGFR 78 02/15/2023 1415   GFRNONAA >60 12/19/2023 0607    IMAGING past 24 hours CT ANGIO HEAD NECK W WO CM Result Date: 12/18/2023 CLINICAL DATA:  Stroke/TIA EXAM: CT ANGIOGRAPHY HEAD AND NECK WITH AND WITHOUT CONTRAST TECHNIQUE: Multidetector CT imaging of the head and neck was performed using the standard protocol during bolus administration of intravenous contrast. Multiplanar CT image reconstructions and MIPs were obtained to evaluate the vascular anatomy. Carotid stenosis measurements (when applicable) are obtained utilizing NASCET criteria, using the distal internal carotid diameter as the denominator. RADIATION DOSE REDUCTION: This exam was performed according to the departmental dose-optimization program which includes automated exposure control, adjustment of the mA and/or kV according to patient size  and/or use of iterative reconstruction technique. CONTRAST:  75mL OMNIPAQUE  IOHEXOL  350 MG/ML SOLN COMPARISON:  12/15/2023 FINDINGS: CT HEAD FINDINGS Brain: No mass,hemorrhage or extra-axial collection. Normal appearance of the parenchyma and CSF spaces. Vascular: No hyperdense vessel or unexpected vascular calcification. Skull: The visualized skull base, calvarium and extracranial soft tissues are normal. Sinuses/Orbits: No fluid levels or advanced mucosal thickening of the visualized paranasal sinuses. No mastoid or middle ear effusion. Normal orbits. CTA NECK FINDINGS Skeleton: No acute abnormality or high grade bony spinal canal stenosis. Other neck: Normal pharynx, larynx and major salivary glands. No cervical lymphadenopathy. Unremarkable thyroid  gland. Upper chest: No pneumothorax or pleural effusion. No nodules or masses. Aortic arch: There is calcific atherosclerosis of the aortic arch. Normal variant aortic arch branching pattern with the brachiocephalic and left common carotid arteries sharing a common origin. RIGHT carotid system: No dissection, occlusion or aneurysm. Mild atherosclerotic calcification at the carotid bifurcation without hemodynamically significant stenosis. LEFT carotid system: No dissection, occlusion or aneurysm. Mild atherosclerotic calcification at the carotid bifurcation without hemodynamically significant stenosis. Vertebral arteries: Left dominant configuration. There is no dissection, occlusion or flow-limiting stenosis to the skull base (V1-V3 segments). CTA HEAD FINDINGS POSTERIOR CIRCULATION: Vertebral arteries are normal. No proximal occlusion of the anterior or inferior cerebellar arteries. Basilar artery is normal. Superior cerebellar arteries are normal. Posterior cerebral arteries are normal. ANTERIOR CIRCULATION: Intracranial internal carotid arteries are normal. Anterior cerebral arteries are normal. Middle cerebral arteries  are normal. Venous sinuses: As permitted by  contrast timing, patent. Anatomic variants: None Review of the MIP images confirms the above findings. IMPRESSION: 1. No emergent large vessel occlusion or hemodynamically significant stenosis of the head or neck. 2. Mild bilateral carotid bifurcation atherosclerosis without hemodynamically significant stenosis. Aortic Atherosclerosis (ICD10-I70.0). Electronically Signed   By: Juanetta Nordmann M.D.   On: 12/18/2023 23:14     Vitals:   12/19/23 0314 12/19/23 0816 12/19/23 0847 12/19/23 1141  BP: 119/88 (!) 133/92  119/82  Pulse: 90 84 81 82  Resp: 15 20 18 18   Temp: (!) 97.4 F (36.3 C) 97.6 F (36.4 C)  98 F (36.7 C)  TempSrc: Oral Oral  Oral  SpO2: 93% 94% 92% 96%  Weight:      Height:         PHYSICAL EXAM General: Chronically ill-appearing elderly patient in no acute distress Psych:  Mood and affect appropriate for situation CV: Regular rate and rhythm on monitor Respiratory:  Regular, unlabored respirations on room air  NEURO:  Mental Status: AA&Ox3, lethargic Speech/Language: speech is without dysarthria or aphasia.    Cranial Nerves:  II: PERRL. Visual fields full.  III, IV, VI: EOMI. Eyelids elevate symmetrically.  V: Sensation is intact to light touch and diminished on the left VII: Face is symmetrical resting and smiling VIII: hearing intact to voice. IX, X: Phonation is normal.  WU:[id number] shrug 5/5. XII: tongue is midline without fasciculations. Motor: Able to move bilateral upper extremities with good antigravity strength, some drift in the left arm, able to lift right lower extremity off the bed, can move left lower extremity able to lift heel slightly off the bed cannot withstand against gravity Tone: is normal and bulk is normal Sensation- subjective decreased sensation on left.  Extinction absent to light touch to DSS.   Coordination: FTN intact bilaterally Gait- deferred  Most Recent NIH  1a Level of Conscious.: 0 1b LOC Questions: 0 1c LOC Commands:  0 2 Best Gaze: 0 3 Visual: 0 4 Facial Palsy: 0 5a Motor Arm - left: 1 5b Motor Arm - Right: 0 6a Motor Leg - Left: 3 6b Motor Leg - Right: 0 7 Limb Ataxia: 0 8 Sensory: 1 9 Best Language: 0 10 Dysarthria: 0 11 Extinct. and Inatten.: 0 TOTAL: 5   ASSESSMENT/PLAN  Mr. SCHAUN DECARLI is a 68 y.o. male with history of anemia, anxiety, asthma, BPH, CHF, COPD, depression, neuropathy, hypertension, sleep apnea, PTSD, A-fib on Eliquis , hyperlipidemia, seizures, diabetes and stroke with residual left-sided weakness admitted after being found slumped over in his wheelchair in his facility with worsening left-sided weakness and confusion.  He was found to have a UTI and has been placed on Rocephin .  He will need MRI to determine if he has had another stroke, but this will need to be performed on Monday due to the remote to his spinal stimulator being lost.  NIH on Admission 13  Likely recrudescence of old stroke symptoms in the setting of UTI, DDx including seizure and stroke Code Stroke CT head No acute abnormality.  Atrophy. ASPECTS 10.    CTA head & neck no LVO MRI not able to perform due to spinal stimulator not able to set to MRI safe mode this time Echo EF: 55-60%. Medtr[employer] aortic valve present.  LDL 79 HgbA1c 6.6 EEG shows diffuse sowing. VTE prophylaxis - fully anticoagulated with Eliquis  Eliquis  (apixaban ) daily prior to admission, now on Eliquis  (apixaban ) daily  Therapy recommendations:  SNF Disposition: Ending  Hx of Stroke/TIA 06/2023 admitted for left cerebellar punctate infarct.  EF 50 to 55%.  LDL 79, A1c 6.4, continue on Eliquis  at discharge  History of seizure-like activity 10/2020 admitted for CHF, SOB and LLE cellulitis.  Reported seizure-like activity lasting 30 seconds with bilateral upper extremity jerking.  MRI/EEG negative.  Put on Keppra  1 g twice daily. Keppra  continued for 1 months and self stopped 02/2021 admitted for left LE weakness and lethargy.  MRI  showed no acute infarct.  MRI C-spine showed spinal stenosis, no spinal cord compression.  Discharged on outpatient PT/OT with plan for EMS/NCS as outpatient. 04/2021 to follow-up with Dr. Samara Crest at [facility] 04/2021 admitted for headache and left-sided weakness.  MRI and EEG negative. 08/2021 admitted for right hand jerking EEG negative.  TEE no PFO.  Put on Keppra  1 g twice daily 09/2021 follow with Dr. Samara Crest, decrease her Keppra  XR 1 g daily 02/2022 admitted for left-sided weakness/numbness and headache.  CT no acute abnormality.  CT head and neck unremarkable.  MRI no acute infarct.  EF 60 to 65%, EEG negative.  LDL 62, A1c 8.0.  Concerning?  Partial seizure versus recrudescence from previous stroke.  Discharged on Keppra  500 twice daily and Vimpat  150 twice daily as well as aspirin  81 and Eliquis . 05/2022 presented with right-sided weakness and dysarthria.  CT head and neck no LVO, MRI negative.  EEG normal.  Continue on Eliquis , considered recrudescence of old deficit in the setting of encephalopathy and hypotension. 07/2023 admitted for altered mental status, MRI and EEG negative. Follows with Dr. Samara Crest at [facility] Continue home Keppra  and Vimpat   Atrial fibrillation Home Meds: Eliquis  5 mg twice daily Continue telemetry monitoring Continue anticoagulation with Eliquis   Hypertension Home meds: Spironolactone  25 mg daily Stable Maintain normotension  Hyperlipidemia Home meds: Atorvastatin  40 mg daily LDL 79, goal < 70 Increased Lipitor to 80 Continue statin at discharge  Diabetes type II Controlled Home meds:  Mounjaro  15 mg weekly HgbA1c 6.6, goal < 7.0 CBGs SSI Recommend close follow-up with PCP for better DM control  Other Stroke Risk Factors Obesity, Body mass index is 43.87 kg/m., BMI >/= 30 associated with increased stroke risk, recommend weight loss, diet and exercise as appropriate  Congestive heart failure OSA  Other Active Problems Status post TAVR UTI, UA WBC more than  50, on Rocephin   Hospital day # 3   Jonette Nestle DNP, ACNPC-AG  [facility]  ATTENDING NOTE: I reviewed above note and agree with the assessment and plan. Pt was seen and examined.   No family at the bedside, pt sitting in bed, frustrated that he can not have MRI due to his spinal stimulator not in MRI safe mode. Given his symptoms much improved and now near baseline, will cancel MRI. No change of management. Continue eliquis  and statin. Continue home seizure meds. Continue UTI treatment. PT and OT recommend SNF. He will follow up with Dr. Samara Crest at [facility].   For detailed assessment and plan, please refer to above as I have made changes wherever appropriate.   Neurology will sign off. Please call with questions. Pt will follow up with Dr. Samara Crest at [facility] in about 4 weeks. Thanks for the consult.   Consuelo Denmark, MD PhD Stroke Neurology 12/19/2023 4:33 PM       To contact Stroke Continuity provider, please refer to [email or web address]. After hours, contact General Neurology

## 2023-12-19 NOTE — Progress Notes (Signed)
 PROGRESS NOTE    BRASEN ICE  ZHY:[id number] DOB: July 16, 1956 DOA: 12/15/2023 PCP: Darnelle Elders, PA-C    Chief Complaint  Patient presents with   Code Stroke    Brief Narrative:  Patient 68 year old gentleman history of COPD, severe aortic stenosis status post TAVR July 2024, seizure disorder, chronic diastolic CHF, A-fib on Eliquis , CVA, gout, OSA on CPAP, anxiety, BPH, chronic venous stasis, morbid obesity presenting with worsening left-sided weakness from his baseline, slurred speech concern for acute CVA.  CT head done unremarkable.  Patient seen in consultation by neurology who recommended MRI of the brain.  Patient however with a spinal cord stimulator, wife trying to locate remote and as such MRI unable to to be done yet.  Urinalysis concerning for UTI patient placed on IV antibiotics.   Assessment & Plan:   Principal Problem:   Acute left-sided weakness Active Problems:   Type 2 diabetes mellitus with obesity (HCC)   COPD (chronic obstructive pulmonary disease) (HCC)   AF (paroxysmal atrial fibrillation) (HCC)   Chronic diastolic CHF (congestive heart failure) (HCC)   S/P TAVR (transcatheter aortic valve replacement)   Seizure disorder (HCC)   Class 3 obesity   OSA (obstructive sleep apnea)   Obesity, Class III, BMI 40-49.9 (morbid obesity)   PTSD (post-traumatic stress disorder)   BPH without obstruction/lower urinary tract symptoms   Peripheral neuropathy   Physical deconditioning   Pure hypercholesterolemia   Chronic venous stasis   Diabetic neuropathy (HCC)   DM2 (diabetes mellitus, type 2) (HCC)   Primary hypertension   Coronary artery disease due to lipid rich plaque   Stroke-like symptoms   Depression   Stage 3a chronic kidney disease (HCC)   MDD (major depressive disorder), recurrent severe, without psychosis (HCC)   UTI (urinary tract infection)   Drowsy  #1 left-sided weakness: Acute ischemic CVA versus recrudescence of prior CVA in the setting  of UTI -Patient with prior history of CVA with some residual left-sided weakness presenting with sudden onset worsening left-sided weakness from baseline, slurred speech, concern for possible facial droop. - Patient brought in as a code stroke. - CT head done negative for any acute abnormalities. -Chest x-ray done negative for any acute abnormalities. -Urinalysis pending. - Patient seen in consultation by neurology who recommended MRI of the brain without contrast and if negative no further workup needed. -Patient with spinal cord stimulator however wife unable to locate remote. -Patient seen by neurology and recommending MRI to be done when [facility] representative is able to bring in remote for nerve stimulator per neurology. -EEG done with mild diffuse slowing indicative of global cerebral dysfunction.  Epileptiform abnormalities not seen. -Patient with clinical improvement. -Continue home regimen Lipitor, Tricor , Lovaza, Eliquis . -PT/OT. -Likely needs SNF. -Neurology following and I appreciate their input and recommendations.   2.  A-fib - Currently rate controlled.  - Patient noted not on rate limiting medications prior to admission.  - Continue Eliquis  for anticoagulation.  3.  UTI -Urine cultures > 100,000 colonies of Klebsiella pneumonia which is sensitive to the cephalosporins, fluoroquinolones, gentamicin, imipenem, Bactrim , Zosyn  and Unasyn and resistant to ampicillin .  - Transition from IV Rocephin  to Duricef to complete course of antibiotic treatment.    4.  Diabetes mellitus type 2 -Hemoglobin A1c noted at 6.6 on 10/04/2023. - CBG noted at 121 this morning.   - SSI.     5.  Depression//anxiety - Continue Abilify , Effexor .   -Decreased BuSpar  to 5 mg 3 times daily as  per wife and RN patient with some bouts of sleepiness intermittently. -Outpatient follow-up.   6.  Chronic diastolic heart failure/CAD/status post TAVR - Currently stable. -Euvolemic on  examination. - Continue home cardiac regimen of Lipitor, spironolactone , Tricor , Lovaza, Zaroxolyn , Demadex .   7.  CKD stage IIIa -Stable.   8.  Seizure disorder -Stable. - EEG obtained negative for any epileptiform discharges noted.  -Continue home regimen Keppra , lacosamide . -Neurology following.   9.  COPD - Continue Incruse Ellipta , Dulera.    10.  Obesity class III -Lifestyle modification - Outpatient follow-up with PCP.   11.  OSA - Continue CPAP nightly.    #12 drowsiness -Patient noted with intermittent bouts of sleepiness and drowsiness however easily arousable and following commands. -Likely secondary to polypharmacy. -Patient with clinical improvement after adjustment of some of his home medications.  -Home regimen BuSpar  has been decreased to 5 mg 3 times daily. - Remeron  discontinued and will not resume on discharge.  -Trazodone  has been decreased to 150 mg nightly which we will continue for now. -Ammonia levels were in normal limits. -Supportive care.     DVT prophylaxis: Eliquis  Code Status: DNR Family Communication: Updated patient.  No family at bedside.  Disposition: Likely back to nursing home once cleared by neurology.  Status is: Inpatient The patient will require care spanning > 2 midnights and should be moved to inpatient because: Severity of illness   Consultants:  Neurology: Dr. Murvin Arthurs 12/15/2023  Procedures:  CT head 12/15/2023 Chest x-ray 12/15/2023 EEG  Antimicrobials:  Anti-infectives (From admission, onward)    Start     Dose/Rate Route Frequency Ordered Stop   12/19/23 1100  cefadroxil  (DURICEF) capsule 1,000 mg        1,000 mg Oral 2 times daily 12/19/23 0919 12/21/23 0959   12/16/23 0900  cefTRIAXone  (ROCEPHIN ) 2 g in sodium chloride  0.9 % 100 mL IVPB  Status:  Discontinued        2 g 200 mL/hr over 30 Minutes Intravenous Every 24 hours 12/16/23 0831 12/19/23 0919   12/15/23 1515  linezolid  (ZYVOX ) tablet 600 mg  Status:   Discontinued        600 mg Oral 2 times daily 12/15/23 1508 12/15/23 1519         Subjective: Patient sitting up in bed, more alert.  Feels better.  Feels left arm weakness is slowly improving.  Objective: Vitals:   12/19/23 0314 12/19/23 0816 12/19/23 0847 12/19/23 1141  BP: 119/88 (!) 133/92  119/82  Pulse: 90 84 81 82  Resp: 15 20 18 18   Temp: (!) 97.4 F (36.3 C) 97.6 F (36.4 C)  98 F (36.7 C)  TempSrc: Oral Oral  Oral  SpO2: 93% 94% 92% 96%  Weight:      Height:        Intake/Output Summary (Last 24 hours) at 12/19/2023 1324 Last data filed at 12/19/2023 1300 Gross per 24 hour  Intake 720 ml  Output 2350 ml  Net -1630 ml   Filed Weights   12/15/23 1018  Weight: (!) 155 kg    Examination:  General exam: NAD.  Alert.  Respiratory system: CTAB.  No wheezes, no crackles, no rhonchi.  Fair air movement.  Speaking in full sentences.  Cardiovascular system: Regular rate rhythm no murmurs rubs or gallops.  No JVD.  No lower extremity edema.  Gastrointestinal system: Abdomen is soft, nontender, nondistended, positive bowel sounds.  No rebound.  No guarding.  Central nervous system: Alert and oriented.  Moving extremities spontaneously.  No focal neurological deficits. Extremities: Symmetric 5 x 5 power. Skin: No rashes, lesions or ulcers Psychiatry: Judgement and insight appear normal. Mood & affect appropriate.     Data Reviewed: I have personally reviewed following labs and imaging studies  CBC: Recent Labs  Lab 12/15/23 0940 12/15/23 0943 12/16/23 0737 12/17/23 0708 12/18/23 0717  WBC 13.6*  --  9.3 6.2 4.5  NEUTROABS 11.0*  --   --   --  1.6*  HGB 13.9 14.6 13.5 13.4 14.1  HCT 40.7 43.0 41.3 40.5 42.2  MCV 88.7  --  90.6 90.8 89.0  PLT 207  --  199 175 195    Basic Metabolic Panel: Recent Labs  Lab 12/15/23 0940 12/15/23 0943 12/16/23 0737 12/17/23 0708 12/18/23 0717 12/19/23 0607  NA 133* 133* 137 137 137 136  K 3.8 3.7 3.7 3.6 3.9 3.5   CL 95* 94* 97* 100 100 102  CO2 26  --  26 27 25 25   GLUCOSE 249* 242* 214* 152* 142* 133*  BUN 21 24* 18 18 17 20   CREATININE 1.26* 1.20 1.13 1.04 1.09 1.05  CALCIUM  8.9  --  8.8* 8.8* 9.2 9.3  MG  --   --  1.9  --   --   --   PHOS  --   --  3.0  --   --   --     GFR: Estimated Creatinine Clearance: 108.9 mL/min (by C-G formula based on SCr of 1.05 mg/dL).  Liver Function Tests: Recent Labs  Lab 12/15/23 0940  AST 16  ALT 22  ALKPHOS 56  BILITOT 0.9  PROT 6.7  ALBUMIN 3.4*    CBG: Recent Labs  Lab 12/18/23 1250 12/18/23 1620 12/18/23 2135 12/19/23 0629 12/19/23 1141  GLUCAP 174* 156* 136* 121* 129*     Recent Results (from the past 240 hours)  Urine Culture (for pregnant, neutropenic or urologic patients or patients with an indwelling urinary catheter)     Status: Abnormal   Collection Time: 12/15/23  3:10 PM   Specimen: Urine, Catheterized  Result Value Ref Range Status   Specimen Description URINE, CATHETERIZED  Final   Special Requests   Final    NONE Performed at [facility], [address] [place], [place] [address]    Culture >=100,000 COLONIES/mL KLEBSIELLA PNEUMONIAE (A)  Final   Report Status 12/18/2023 FINAL  Final   Organism ID, Bacteria KLEBSIELLA PNEUMONIAE (A)  Final      Susceptibility   Klebsiella pneumoniae - MIC*    AMPICILLIN  >=32 RESISTANT Resistant     CEFAZOLIN  <=4 SENSITIVE Sensitive     CEFEPIME  <=0.12 SENSITIVE Sensitive     CEFTRIAXONE  <=0.25 SENSITIVE Sensitive     CIPROFLOXACIN <=0.25 SENSITIVE Sensitive     GENTAMICIN <=1 SENSITIVE Sensitive     IMIPENEM <=0.25 SENSITIVE Sensitive     NITROFURANTOIN 64 INTERMEDIATE Intermediate     TRIMETH /SULFA  <=20 SENSITIVE Sensitive     AMPICILLIN /SULBACTAM 8 SENSITIVE Sensitive     PIP/TAZO 8 SENSITIVE Sensitive ug/mL    * >=100,000 COLONIES/mL KLEBSIELLA PNEUMONIAE         Radiology Studies: CT ANGIO HEAD NECK W WO CM Result Date: 12/18/2023 CLINICAL DATA:   Stroke/TIA EXAM: CT ANGIOGRAPHY HEAD AND NECK WITH AND WITHOUT CONTRAST TECHNIQUE: Multidetector CT imaging of the head and neck was performed using the standard protocol during bolus administration of intravenous contrast. Multiplanar CT image reconstructions and MIPs were obtained to evaluate  the vascular anatomy. Carotid stenosis measurements (when applicable) are obtained utilizing NASCET criteria, using the distal internal carotid diameter as the denominator. RADIATION DOSE REDUCTION: This exam was performed according to the departmental dose-optimization program which includes automated exposure control, adjustment of the mA and/or kV according to patient size and/or use of iterative reconstruction technique. CONTRAST:  75mL OMNIPAQUE  IOHEXOL  350 MG/ML SOLN COMPARISON:  12/15/2023 FINDINGS: CT HEAD FINDINGS Brain: No mass,hemorrhage or extra-axial collection. Normal appearance of the parenchyma and CSF spaces. Vascular: No hyperdense vessel or unexpected vascular calcification. Skull: The visualized skull base, calvarium and extracranial soft tissues are normal. Sinuses/Orbits: No fluid levels or advanced mucosal thickening of the visualized paranasal sinuses. No mastoid or middle ear effusion. Normal orbits. CTA NECK FINDINGS Skeleton: No acute abnormality or high grade bony spinal canal stenosis. Other neck: Normal pharynx, larynx and major salivary glands. No cervical lymphadenopathy. Unremarkable thyroid  gland. Upper chest: No pneumothorax or pleural effusion. No nodules or masses. Aortic arch: There is calcific atherosclerosis of the aortic arch. Normal variant aortic arch branching pattern with the brachiocephalic and left common carotid arteries sharing a common origin. RIGHT carotid system: No dissection, occlusion or aneurysm. Mild atherosclerotic calcification at the carotid bifurcation without hemodynamically significant stenosis. LEFT carotid system: No dissection, occlusion or aneurysm. Mild  atherosclerotic calcification at the carotid bifurcation without hemodynamically significant stenosis. Vertebral arteries: Left dominant configuration. There is no dissection, occlusion or flow-limiting stenosis to the skull base (V1-V3 segments). CTA HEAD FINDINGS POSTERIOR CIRCULATION: Vertebral arteries are normal. No proximal occlusion of the anterior or inferior cerebellar arteries. Basilar artery is normal. Superior cerebellar arteries are normal. Posterior cerebral arteries are normal. ANTERIOR CIRCULATION: Intracranial internal carotid arteries are normal. Anterior cerebral arteries are normal. Middle cerebral arteries are normal. Venous sinuses: As permitted by contrast timing, patent. Anatomic variants: None Review of the MIP images confirms the above findings. IMPRESSION: 1. No emergent large vessel occlusion or hemodynamically significant stenosis of the head or neck. 2. Mild bilateral carotid bifurcation atherosclerosis without hemodynamically significant stenosis. Aortic Atherosclerosis (ICD10-I70.0). Electronically Signed   By: Juanetta Nordmann M.D.   On: 12/18/2023 23:14        Scheduled Meds:  allopurinol   100 mg Oral Daily   apixaban   5 mg Oral BID   ARIPiprazole   5 mg Oral Daily   atorvastatin   80 mg Oral QHS   busPIRone   5 mg Oral TID   cefadroxil   1,000 mg Oral BID   colchicine   0.6 mg Oral Daily   cyanocobalamin   1,000 mcg Oral Daily   fenofibrate   54 mg Oral Daily   ferrous sulfate   325 mg Oral Daily   fluticasone   2 spray Each Nare Daily   fluticasone  furoate-vilanterol  1 puff Inhalation Daily   insulin  aspart  0-9 Units Subcutaneous TID WC   lacosamide   150 mg Oral BID   levETIRAcetam   500 mg Oral BID   metolazone   2.5 mg Oral Weekly   midodrine   5 mg Oral TID WC   omega-3 acid ethyl esters  2 g Oral BID   pantoprazole   40 mg Oral Daily   polyethylene glycol  17 g Oral Daily   potassium chloride   40 mEq Oral q AM   And   potassium chloride   20 mEq Oral QPM    prazosin   2 mg Oral QHS   pregabalin   150 mg Oral TID   rOPINIRole   0.25 mg Oral BID   senna-docusate  2 tablet Oral BID  sodium chloride  flush  3 mL Intravenous Once   sodium chloride  flush  3 mL Intravenous Q12H   spironolactone   12.5 mg Oral Daily   torsemide   20 mg Oral BID   traZODone   150 mg Oral QHS   umeclidinium bromide   1 puff Inhalation Daily   venlafaxine  XR  150 mg Oral Q breakfast   Continuous Infusions:     LOS: 3 days    Time spent: 40 minutes    Hilda Lovings, MD [facility]   To contact the attending provider between 7A-7P or the covering provider during after hours 7P-7A, please log into the web site [email or web address] and access using universal [place] password for that web site. If you do not have the password, please call the hospital operator.  12/19/2023, 1:24 PM

## 2023-12-19 NOTE — Progress Notes (Signed)
 SLP Cancellation Note  Patient Details Name: Lawrence [name] MRN: [id number] DOB: Jun 01, 1956   Cancelled treatment:       Reason Eval/Treat Not Completed: SLP screened, w/u thus far has been negative for acute infarct. Pt has a history of cognitive-linguistic impairments, for which he is being seen by SLP at [facility] SN[facility]. No acute needs identified, recommend he continue to f/u with SLP at next venue of care. Will sign off but please re-consult if indicated.    Amil Kale, M.A., CCC-SLP Speech Language Pathology, Acute Rehabilitation Services  Secure Chat preferred [phone]  12/19/2023, 11:46 AM

## 2023-12-19 NOTE — Progress Notes (Signed)
 Physical Therapy Treatment Patient Details Name: Lawrence [name] MRN: [id number] DOB: [birth year] Today's Date: 12/19/2023   History of Present Illness Pt is a 68 yo male presenting to [facility] ED on 12/15/23 with worsening L sided weakness and slurred speech. Awaiting MRI (pending being able to turn off his spinal stimulator). Pt was recently admitted on 11/27/23 after and unwitnessed fall found to be hypotensive and + for Covid 19. PMH of COPD, severe aortic stenosis s/p TAVR (2024), seizure disorder, CHF, Afib, hx of CVA, OSA, BPH, obesity.    PT Comments  Patient continues to require assistance with bed mobility. He declined sitting up on edge of bed today but has good sitting balance with long sitting in bed. Bed placed in chair position to promote upright conditioning. Assistance for LE therapeutic exercises for strengthening. Patient will need rehabilitation < 3 hours/day after this hospital stay.    If plan is discharge home, recommend the following: Two people to help with walking and/or transfers;Two people to help with bathing/dressing/bathroom   Can travel by private vehicle     No  Equipment Recommendations  Hospital bed;Hoyer lift;Wheelchair (measurements PT);Wheelchair cushion (measurements PT)    Recommendations for Other Services       Precautions / Restrictions Precautions Precautions: Fall Recall of Precautions/Restrictions: Intact Restrictions Weight Bearing Restrictions Per Provider Order: No     Mobility  Bed Mobility Overal bed mobility: Needs Assistance Bed Mobility: Rolling Rolling: Contact guard assist         General bed mobility comments: rolling performed to remove soiled bed pad with minimal cues for technique. patient is able to pull trunk upright using bed rails to achieve long sitting position (from head of bed at 30 degrees). he declined further mobility today. encouraged chair position in bed for upright conditioning    Transfers                         Ambulation/Gait                   Stairs             Wheelchair Mobility     Tilt Bed    Modified Rankin (Stroke Patients Only)       Balance Overall balance assessment: Needs assistance Sitting-balance support: No upper extremity supported (long sitting) Sitting balance-Leahy Scale: Good                                      Communication Communication Communication: No apparent difficulties  Cognition Arousal: Alert Behavior During Therapy: WFL for tasks assessed/performed   PT - Cognitive impairments: No apparent impairments                         Following commands: Intact      Cueing Cueing Techniques: Verbal cues  Exercises General Exercises - Lower Extremity Ankle Circles/Pumps: AROM, Strengthening, Left, 5 reps, Seated (chair position in bed) Long Arc Quad: AAROM, Strengthening, Left, 10 reps, Seated (chair position in bed) Heel Slides: AAROM, Strengthening, Left, 10 reps, Seated (chair position in bed) Other Exercises Other Exercises: cues for technique for strengthening    General Comments        Pertinent Vitals/Pain Pain Assessment Pain Assessment: No/denies pain    Home Living  Prior Function            PT Goals (current goals can now be found in the care plan section) Acute Rehab PT Goals Patient Stated Goal: get stronger, walk again PT Goal Formulation: With patient Time For Goal Achievement: 12/30/23 Potential to Achieve Goals: Good Progress towards PT goals: Progressing toward goals    Frequency    Min 1X/week      PT Plan      Co-evaluation              AM-PAC PT "6 Clicks" Mobility   Outcome Measure  Help needed turning from your back to your side while in a flat bed without using bedrails?: A Little Help needed moving from lying on your back to sitting on the side of a flat bed without using bedrails?: A Little Help  needed moving to and from a bed to a chair (including a wheelchair)?: Total Help needed standing up from a chair using your arms (e.g., wheelchair or bedside chair)?: Total Help needed to walk in hospital room?: Total Help needed climbing 3-5 steps with a railing? : Total 6 Click Score: 10    End of Session   Activity Tolerance: Patient tolerated treatment well Patient left: in bed;with call bell/phone within reach;with bed alarm set (chair position in bed) Nurse Communication: Mobility status PT Visit Diagnosis: Muscle weakness (generalized) (M62.81);History of falling (Z91.81);Difficulty in walking, not elsewhere classified (R26.2);Unsteadiness on feet (R26.81)     Time: 1610-9604 PT Time Calculation (min) (ACUTE ONLY): 23 min  Charges:    $Therapeutic Exercise: 8-22 mins $Therapeutic Activity: 8-22 mins PT General Charges $$ ACUTE PT VISIT: 1 Visit                     Ozie Bo, PT, MPT    Erlene Hawks 12/19/2023, 12:51 PM

## 2023-12-19 NOTE — Plan of Care (Signed)
 Problem: Education: Goal: Knowledge of risk factors and measures for prevention of condition will improve 12/19/2023 1709 by Pastor Bong, RN Outcome: Progressing 12/19/2023 1708 by Pastor Bong, RN Outcome: Progressing   Problem: Coping: Goal: Psychosocial and spiritual needs will be supported 12/19/2023 1709 by Pastor Bong, RN Outcome: Progressing 12/19/2023 1708 by Pastor Bong, RN Outcome: Progressing   Problem: Respiratory: Goal: Will maintain a patent airway 12/19/2023 1709 by Pastor Bong, RN Outcome: Progressing 12/19/2023 1708 by Pastor Bong, RN Outcome: Progressing Goal: Complications related to the disease process, condition or treatment will be avoided or minimized 12/19/2023 1709 by Pastor Bong, RN Outcome: Progressing 12/19/2023 1708 by Pastor Bong, RN Outcome: Progressing   Problem: Education: Goal: Ability to describe self-care measures that may prevent or decrease complications (Diabetes Survival Skills Education) will improve 12/19/2023 1709 by Pastor Bong, RN Outcome: Progressing 12/19/2023 1708 by Pastor Bong, RN Outcome: Progressing Goal: Individualized Educational Video(s) 12/19/2023 1709 by Pastor Bong, RN Outcome: Progressing 12/19/2023 1708 by Pastor Bong, RN Outcome: Progressing   Problem: Coping: Goal: Ability to adjust to condition or change in health will improve 12/19/2023 1709 by Pastor Bong, RN Outcome: Progressing 12/19/2023 1708 by Pastor Bong, RN Outcome: Progressing   Problem: Fluid Volume: Goal: Ability to maintain a balanced intake and output will improve 12/19/2023 1709 by Pastor Bong, RN Outcome: Progressing 12/19/2023 1708 by Pastor Bong, RN Outcome: Progressing   Problem: Health Behavior/Discharge Planning: Goal: Ability to identify and  utilize available resources and services will improve 12/19/2023 1709 by Pastor Bong, RN Outcome: Progressing 12/19/2023 1708 by Pastor Bong, RN Outcome: Progressing Goal: Ability to manage health-related needs will improve 12/19/2023 1709 by Pastor Bong, RN Outcome: Progressing 12/19/2023 1708 by Pastor Bong, RN Outcome: Progressing   Problem: Metabolic: Goal: Ability to maintain appropriate glucose levels will improve 12/19/2023 1709 by Pastor Bong, RN Outcome: Progressing 12/19/2023 1708 by Pastor Bong, RN Outcome: Progressing   Problem: Nutritional: Goal: Maintenance of adequate nutrition will improve 12/19/2023 1709 by Pastor Bong, RN Outcome: Progressing 12/19/2023 1708 by Pastor Bong, RN Outcome: Progressing Goal: Progress toward achieving an optimal weight will improve 12/19/2023 1709 by Pastor Bong, RN Outcome: Progressing 12/19/2023 1708 by Pastor Bong, RN Outcome: Progressing   Problem: Skin Integrity: Goal: Risk for impaired skin integrity will decrease 12/19/2023 1709 by Pastor Bong, RN Outcome: Progressing 12/19/2023 1708 by Pastor Bong, RN Outcome: Progressing   Problem: Tissue Perfusion: Goal: Adequacy of tissue perfusion will improve 12/19/2023 1709 by Pastor Bong, RN Outcome: Progressing 12/19/2023 1708 by Pastor Bong, RN Outcome: Progressing   Problem: Education: Goal: Knowledge of General Education information will improve Description: Including pain rating scale, medication(s)/side effects and non-pharmacologic comfort measures 12/19/2023 1709 by Pastor Bong, RN Outcome: Progressing 12/19/2023 1708 by Pastor Bong, RN Outcome: Progressing   Problem: Health Behavior/Discharge Planning: Goal: Ability to manage health-related needs will improve 12/19/2023  1709 by Pastor Bong, RN Outcome: Progressing 12/19/2023 1708 by Pastor Bong, RN Outcome: Progressing   Problem: Clinical Measurements: Goal: Ability to maintain clinical measurements within normal limits will improve 12/19/2023 1709 by Pastor Bong, RN Outcome: Progressing 12/19/2023 1708 by Pastor Bong, RN Outcome: Progressing Goal: Will remain free from infection 12/19/2023 1709 by Pastor Bong, RN Outcome: Progressing 12/19/2023 1708 by Pastor Bong, RN Outcome: Progressing Goal: Diagnostic test  results will improve 12/19/2023 1709 by Pastor Bong, RN Outcome: Progressing 12/19/2023 1708 by Pastor Bong, RN Outcome: Progressing Goal: Respiratory complications will improve 12/19/2023 1709 by Pastor Bong, RN Outcome: Progressing 12/19/2023 1708 by Pastor Bong, RN Outcome: Progressing Goal: Cardiovascular complication will be avoided 12/19/2023 1709 by Pastor Bong, RN Outcome: Progressing 12/19/2023 1708 by Pastor Bong, RN Outcome: Progressing   Problem: Activity: Goal: Risk for activity intolerance will decrease 12/19/2023 1709 by Pastor Bong, RN Outcome: Progressing 12/19/2023 1708 by Pastor Bong, RN Outcome: Progressing   Problem: Nutrition: Goal: Adequate nutrition will be maintained 12/19/2023 1709 by Pastor Bong, RN Outcome: Progressing 12/19/2023 1708 by Pastor Bong, RN Outcome: Progressing   Problem: Coping: Goal: Level of anxiety will decrease 12/19/2023 1709 by Pastor Bong, RN Outcome: Progressing 12/19/2023 1708 by Pastor Bong, RN Outcome: Progressing   Problem: Elimination: Goal: Will not experience complications related to bowel motility 12/19/2023 1709 by Pastor Bong, RN Outcome: Progressing 12/19/2023 1708 by Pastor Bong, RN Outcome: Progressing Goal: Will not experience complications related to urinary retention 12/19/2023 1709 by Pastor Bong, RN Outcome: Progressing 12/19/2023 1708 by Pastor Bong, RN Outcome: Progressing   Problem: Pain Managment: Goal: General experience of comfort will improve and/or be controlled 12/19/2023 1709 by Pastor Bong, RN Outcome: Progressing 12/19/2023 1708 by Pastor Bong, RN Outcome: Progressing   Problem: Safety: Goal: Ability to remain free from injury will improve 12/19/2023 1709 by Pastor Bong, RN Outcome: Progressing 12/19/2023 1708 by Pastor Bong, RN Outcome: Progressing   Problem: Skin Integrity: Goal: Risk for impaired skin integrity will decrease 12/19/2023 1709 by Pastor Bong, RN Outcome: Progressing 12/19/2023 1708 by Pastor Bong, RN Outcome: Progressing   Problem: Education: Goal: Knowledge of disease or condition will improve 12/19/2023 1709 by Pastor Bong, RN Outcome: Progressing 12/19/2023 1708 by Pastor Bong, RN Outcome: Progressing Goal: Knowledge of secondary prevention will improve (MUST DOCUMENT ALL) 12/19/2023 1709 by Pastor Bong, RN Outcome: Progressing 12/19/2023 1708 by Pastor Bong, RN Outcome: Progressing Goal: Knowledge of patient specific risk factors will improve (DELETE if not current risk factor) 12/19/2023 1709 by Pastor Bong, RN Outcome: Progressing 12/19/2023 1708 by Pastor Bong, RN Outcome: Progressing   Problem: Ischemic Stroke/TIA Tissue Perfusion: Goal: Complications of ischemic stroke/TIA will be minimized 12/19/2023 1709 by Pastor Bong, RN Outcome: Progressing 12/19/2023 1708 by Pastor Bong, RN Outcome: Progressing   Problem: Coping: Goal: Will verbalize positive feelings about self 12/19/2023 1709 by Pastor Bong, RN Outcome: Progressing 12/19/2023 1708 by Pastor Bong, RN Outcome: Progressing Goal: Will identify appropriate support needs 12/19/2023 1709 by Pastor Bong, RN Outcome: Progressing 12/19/2023 1708 by Pastor Bong, RN Outcome: Progressing   Problem: Health Behavior/Discharge Planning: Goal: Ability to manage health-related needs will improve 12/19/2023 1709 by Pastor Bong, RN Outcome: Progressing 12/19/2023 1708 by Pastor Bong, RN Outcome: Progressing Goal: Goals will be collaboratively established with patient/family 12/19/2023 1709 by Pastor Bong, RN Outcome: Progressing 12/19/2023 1708 by Pastor Bong, RN Outcome: Progressing   Problem: Self-Care: Goal: Ability to participate in self-care as condition permits will improve 12/19/2023 1709 by Pastor Bong, RN Outcome: Progressing 12/19/2023 1708 by Pastor Bong, RN Outcome: Progressing Goal: Verbalization of feelings and concerns over difficulty with self-care will improve 12/19/2023 1709 by Pastor Bong, RN Outcome: Progressing 12/19/2023 1708 by  Pastor Bong, RN Outcome: Progressing Goal: Ability to communicate needs accurately will improve 12/19/2023 1709 by Pastor Bong, RN Outcome: Progressing 12/19/2023 1708 by Pastor Bong, RN Outcome: Progressing

## 2023-12-19 NOTE — Plan of Care (Incomplete)
  Problem: Education: Goal: Knowledge of risk factors and measures for prevention of condition will improve Outcome: Progressing   Problem: Coping: Goal: Psychosocial and spiritual needs will be supported Outcome: Progressing   Problem: Respiratory: Goal: Will maintain a patent airway Outcome: Progressing Goal: Complications related to the disease process, condition or treatment will be avoided or minimized Outcome: Progressing   Problem: Education: Goal: Ability to describe self-care measures that may prevent or decrease complications (Diabetes Survival Skills Education) will improve Outcome: Progressing Goal: Individualized Educational Video(s) Outcome: Progressing   Problem: Coping: Goal: Ability to adjust to condition or change in health will improve Outcome: Progressing   Problem: Fluid Volume: Goal: Ability to maintain a balanced intake and output will improve Outcome: Progressing   Problem: Health Behavior/Discharge Planning: Goal: Ability to identify and utilize available resources and services will improve Outcome: Progressing Goal: Ability to manage health-related needs will improve Outcome: Progressing   Problem: Metabolic: Goal: Ability to maintain appropriate glucose levels will improve Outcome: Progressing   Problem: Nutritional: Goal: Maintenance of adequate nutrition will improve Outcome: Progressing Goal: Progress toward achieving an optimal weight will improve Outcome: Progressing   Problem: Skin Integrity: Goal: Risk for impaired skin integrity will decrease Outcome: Progressing   Problem: Tissue Perfusion: Goal: Adequacy of tissue perfusion will improve Outcome: Progressing   Problem: Education: Goal: Knowledge of General Education information will improve Description: Including pain rating scale, medication(s)/side effects and non-pharmacologic comfort measures Outcome: Progressing   Problem: Health Behavior/Discharge Planning: Goal:  Ability to manage health-related needs will improve Outcome: Progressing   Problem: Clinical Measurements: Goal: Ability to maintain clinical measurements within normal limits will improve Outcome: Progressing Goal: Will remain free from infection Outcome: Progressing Goal: Diagnostic test results will improve Outcome: Progressing Goal: Respiratory complications will improve Outcome: Progressing Goal: Cardiovascular complication will be avoided Outcome: Progressing   Problem: Activity: Goal: Risk for activity intolerance will decrease Outcome: Progressing   Problem: Nutrition: Goal: Adequate nutrition will be maintained Outcome: Progressing   Problem: Coping: Goal: Level of anxiety will decrease Outcome: Progressing   Problem: Elimination: Goal: Will not experience complications related to bowel motility Outcome: Progressing Goal: Will not experience complications related to urinary retention Outcome: Progressing   Problem: Pain Managment: Goal: General experience of comfort will improve and/or be controlled Outcome: Progressing   Problem: Safety: Goal: Ability to remain free from injury will improve Outcome: Progressing   Problem: Skin Integrity: Goal: Risk for impaired skin integrity will decrease Outcome: Progressing   Problem: Education: Goal: Knowledge of disease or condition will improve Outcome: Progressing Goal: Knowledge of secondary prevention will improve (MUST DOCUMENT ALL) Outcome: Progressing Goal: Knowledge of patient specific risk factors will improve (DELETE if not current risk factor) Outcome: Progressing   Problem: Ischemic Stroke/TIA Tissue Perfusion: Goal: Complications of ischemic stroke/TIA will be minimized Outcome: Progressing   Problem: Coping: Goal: Will verbalize positive feelings about self Outcome: Progressing Goal: Will identify appropriate support needs Outcome: Progressing   Problem: Health Behavior/Discharge  Planning: Goal: Ability to manage health-related needs will improve Outcome: Progressing Goal: Goals will be collaboratively established with patient/family Outcome: Progressing   Problem: Self-Care: Goal: Ability to participate in self-care as condition permits will improve Outcome: Progressing Goal: Verbalization of feelings and concerns over difficulty with self-care will improve Outcome: Progressing Goal: Ability to communicate needs accurately will improve Outcome: Progressing

## 2023-12-20 DIAGNOSIS — E1169 Type 2 diabetes mellitus with other specified complication: Secondary | ICD-10-CM | POA: Diagnosis not present

## 2023-12-20 DIAGNOSIS — G40909 Epilepsy, unspecified, not intractable, without status epilepticus: Secondary | ICD-10-CM | POA: Diagnosis not present

## 2023-12-20 DIAGNOSIS — R531 Weakness: Secondary | ICD-10-CM | POA: Diagnosis not present

## 2023-12-20 DIAGNOSIS — E66813 Obesity, class 3: Secondary | ICD-10-CM | POA: Diagnosis not present

## 2023-12-20 LAB — GLUCOSE, CAPILLARY
Glucose-Capillary: 205 mg/dL — ABNORMAL HIGH (ref 70–99)
Glucose-Capillary: 191 mg/dL — ABNORMAL HIGH (ref 70–99)

## 2023-12-20 LAB — BASIC METABOLIC PANEL WITH GFR
Anion gap: 10 (ref 5–15)
BUN: 22 mg/dL (ref 8–23)
CO2: 28 mmol/L (ref 22–32)
Calcium: 9.1 mg/dL (ref 8.9–10.3)
Chloride: 99 mmol/L (ref 98–111)
Creatinine, Ser: 1.15 mg/dL (ref 0.61–1.24)
GFR, Estimated: >60 mL/min (ref >60)
Glucose, Bld: 188 mg/dL — ABNORMAL HIGH (ref 70–99)
Potassium: 3.3 mmol/L — ABNORMAL LOW (ref 3.5–5.1)
Sodium: 137 mmol/L (ref 135–145)

## 2023-12-20 MED ORDER — BUSPIRONE HCL 5 MG PO TABS: 5.0000 mg | ORAL_TABLET | Freq: Three times a day (TID) | ORAL | 1 refills | Status: DC

## 2023-12-20 MED ORDER — ATORVASTATIN CALCIUM 80 MG PO TABS: 80.0000 mg | ORAL_TABLET | Freq: Every day | ORAL | 3 refills | Status: DC

## 2023-12-20 MED ORDER — POTASSIUM CHLORIDE CRYS ER 20 MEQ PO TBCR
40.0000 meq | EXTENDED_RELEASE_TABLET | Freq: Two times a day (BID) | ORAL | Status: DC
  Administered 2023-12-20: 40 meq via ORAL
  Filled 2023-12-20: qty 2

## 2023-12-20 MED ORDER — CEFADROXIL 500 MG PO CAPS: 1000.0000 mg | ORAL_CAPSULE | Freq: Two times a day (BID) | ORAL | 0 refills | Status: AC

## 2023-12-20 MED ORDER — TRAZODONE HCL 150 MG PO TABS: 150.0000 mg | ORAL_TABLET | Freq: Every day | ORAL | 0 refills | Status: AC

## 2023-12-20 NOTE — [place] FL2 (Signed)
 [name]  MEDICAID [id number] LEVEL OF CARE FORM     IDENTIFICATION  Patient Name: Lawrence [name] Birthdate: November 04, 1955 Sex: male Admission Date (Current Location): 12/15/2023  [facility] and [place] Number:  Producer, television/film/video and Address:  [facility], [address], [place], [place] [address]      Provider Number: [id number]  Attending Physician Name and Address:  Lawrence Horn, MD  Relative Name and Phone Number:       Current Level of Care: Hospital Recommended Level of Care: Skilled Nursing Facility Prior Approval Number:    Date Approved/Denied:   PASRR Number:    Discharge Plan: SNF    Current Diagnoses: Patient Active Problem List   Diagnosis Date Noted   Drowsy 12/17/2023   UTI (urinary tract infection) 12/16/2023   Acute left-sided weakness 12/15/2023   COVID-19 virus infection 11/27/2023   Fever 11/27/2023   Fall from wheelchair 11/27/2023   Hyperlipidemia 11/27/2023   Acute respiratory failure with hypoxia (HCC) 10/20/2023   Influenza A with pneumonia 10/20/2023   Altered mental status 10/05/2023   MDD (major depressive disorder), recurrent severe, without psychosis (HCC) 10/05/2023   Hypoglycemia 10/03/2023   SIRS (systemic inflammatory response syndrome) (HCC) 10/03/2023   History of COPD 10/03/2023   History of seizures 10/03/2023   Multiple falls 07/21/2023   Fall 07/15/2023   Acute metabolic encephalopathy 07/14/2023   Obesity, Class III, BMI 40-49.9 (morbid obesity) 07/14/2023   History of CVA with residual deficit 07/14/2023   Fall at home, initial encounter 07/14/2023   Gout 07/12/2023   Constipation 07/09/2023   Polyneuropathy 07/04/2023   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   Acute kidney injury superimposed on stage 3a chronic kidney disease (HCC) 07/03/2023   Hypotension 07/03/2023   Stage 3a chronic kidney disease (HCC) 06/30/2023   Acute stroke due to occlusion of left cerebellar artery (HCC)  06/28/2023   Type 2 diabetes mellitus with diabetic polyneuropathy, with long-term current use of insulin  (HCC) 06/28/2023   CVA (cerebral vascular accident) (HCC) 06/25/2023   Atrial fibrillation, chronic (HCC) 06/25/2023   Acute CVA (cerebrovascular accident) (HCC) 06/25/2023   S/P TAVR (transcatheter aortic valve replacement) 02/07/2023   Acute respiratory failure with hypoxia and hypercapnia (HCC) 01/23/2023   Nail, injury by, initial encounter 12/09/2022   Heart failure (HCC) 10/27/2022   Gout attack 10/27/2022   CHF (congestive heart failure) (HCC) 10/26/2022   Right sided weakness 05/26/2022   Dysarthria 05/25/2022   Ischemic stroke (HCC) 02/25/2022   Hemiparesis affecting left side as late effect of cerebrovascular accident (CVA) (HCC) 02/25/2022   Durable power of attorney for healthcare exists  02/25/2022   Depression 08/07/2021   Paronychia of great toe, left 07/07/2021   Stroke-like symptoms 04/27/2021   Ambulatory dysfunction 04/27/2021   Left-sided weakness 02/09/2021   Atrial fibrillation with rapid ventricular response (HCC) 02/08/2021   AF (paroxysmal atrial fibrillation) (HCC) 01/25/2021   COPD (chronic obstructive pulmonary disease) (HCC) 01/25/2021   Aortic stenosis 01/21/2021   Acquired dilation of ascending aorta and aortic root (HCC) 01/21/2021   Coronary artery disease due to lipid rich plaque    SVT (supraventricular tachycardia) (HCC)    Seizure disorder (HCC)    Primary hypertension 10/17/2020   Hypokalemia 10/17/2020   Cellulitis of right leg 12/04/2019   Pain due to onychomycosis of toenails of both feet 09/11/2019   Diabetic neuropathy (HCC) 09/11/2019   DM2 (diabetes mellitus, type 2) (HCC) 09/11/2019   Sinus tachycardia 03/07/2019  Chronic venous stasis 03/07/2019   History of anemia 12/26/2018   Anemia 08/24/2018   History of colon polyps 08/24/2018   Do not resuscitate status 08/24/2018   Chronic diastolic CHF (congestive heart failure)  (HCC) 08/18/2018   Coronary artery calcification seen on CAT scan    Pure hypercholesterolemia    Chest pain    Shortness of breath    Right foot pain 01/13/2018   Acute on chronic diastolic CHF (congestive heart failure) (HCC) 01/13/2018   Type 2 diabetes mellitus with obesity (HCC) 01/13/2018   Lower extremity cellulitis 11/07/2017   Primary osteoarthritis, left shoulder 03/05/2017   BPH without obstruction/lower urinary tract symptoms 02/22/2017   Hematuria 02/22/2017   Peripheral neuropathy 02/22/2017   Physical deconditioning 02/22/2017   Class 3 obesity    PTSD (post-traumatic stress disorder)    Anasarca 01/31/2017   Bilateral lower extremity edema 01/31/2017   OSA (obstructive sleep apnea)     Orientation RESPIRATION BLADDER Height & Weight     Self, Time, Situation, Place  Normal Incontinent Weight: (!) 341 lb 11.4 oz (155 kg) Height:  6\' 2"  (188 cm)  BEHAVIORAL SYMPTOMS/MOOD NEUROLOGICAL BOWEL NUTRITION STATUS    Convulsions/Seizures Continent Diet (2 gm sodium)  AMBULATORY STATUS COMMUNICATION OF NEEDS Skin   Extensive Assist Verbally Normal                       Personal Care Assistance Level of Assistance  Bathing, Feeding, Dressing Bathing Assistance: Maximum assistance Feeding assistance: Limited assistance Dressing Assistance: Maximum assistance     Functional Limitations Info             SPECIAL CARE FACTORS FREQUENCY  PT (By licensed PT), OT (By licensed OT)     PT Frequency: eval and treat OT Frequency: eval and treat            Contractures Contractures Info: Not present    Additional Factors Info  Code Status, Allergies, Psychotropic, Insulin  Sliding Scale Code Status Info: DNR Allergies Info: Niacin And Related, Ppd (Tuberculin Purified Protein Derivative), Tubersol (Tuberculin, Ppd), Firvanq  (Vancomycin ), Vibramycin  (Doxycycline ) Psychotropic Info: Abilify  5mg  daily; Buspar  5mg  3x/day; Effexor  XR 150mg  daily with  breakfast Insulin  Sliding Scale Info: see DC summary       Current Medications (12/20/2023):  This is the current hospital active medication list Current Facility-Administered Medications  Medication Dose Route Frequency Provider Last Rate Last Admin   acetaminophen  (TYLENOL ) tablet 650 mg  650 mg Oral Q6H PRN Armenta Landau, MD   650 mg at 12/19/23 0016   Or   acetaminophen  (TYLENOL ) suppository 650 mg  650 mg Rectal Q6H PRN Armenta Landau, MD       allopurinol  (ZYLOPRIM ) tablet 100 mg  100 mg Oral Daily Armenta Landau, MD   100 mg at 12/20/23 6578   apixaban  (ELIQUIS ) tablet 5 mg  5 mg Oral BID Armenta Landau, MD   5 mg at 12/20/23 4696   ARIPiprazole  (ABILIFY ) tablet 5 mg  5 mg Oral Daily Armenta Landau, MD   5 mg at 12/20/23 0943   atorvastatin  (LIPITOR) tablet 80 mg  80 mg Oral QHS de Thayne Fine, Cortney E, NP   80 mg at 12/19/23 2138   busPIRone  (BUSPAR ) tablet 5 mg  5 mg Oral TID Armenta Landau, MD   5 mg at 12/20/23 0944   cefadroxil  (DURICEF) capsule 1,000 mg  1,000 mg Oral BID Armenta Landau, MD  1,000 mg at 12/20/23 1478   colchicine  tablet 0.6 mg  0.6 mg Oral Daily Armenta Landau, MD   0.6 mg at 12/20/23 2956   cyanocobalamin  (VITAMIN B12) tablet 1,000 mcg  1,000 mcg Oral Daily Armenta Landau, MD   1,000 mcg at 12/20/23 2130   fenofibrate  tablet 54 mg  54 mg Oral Daily Armenta Landau, MD   54 mg at 12/20/23 8657   ferrous sulfate  tablet 325 mg  325 mg Oral Daily Armenta Landau, MD   325 mg at 12/20/23 8469   fluticasone  (FLONASE ) 50 MCG/ACT nasal spray 2 spray  2 spray Each Nare Daily Armenta Landau, MD   2 spray at 12/20/23 6295   fluticasone  furoate-vilanterol (BREO ELLIPTA ) 200-25 MCG/ACT 1 puff  1 puff Inhalation Daily Armenta Landau, MD   1 puff at 12/20/23 2841   hydrALAZINE  (APRESOLINE ) injection 10 mg  10 mg Intravenous Q6H PRN Armenta Landau, MD       HYDROcodone -acetaminophen  (NORCO/VICODIN) 5-325 MG per tablet 1  tablet  1 tablet Oral Q6H PRN Armenta Landau, MD   1 tablet at 12/19/23 1702   insulin  aspart (novoLOG ) injection 0-9 Units  0-9 Units Subcutaneous TID WC Armenta Landau, MD   3 Units at 12/20/23 3244   lacosamide  (VIMPAT ) tablet 150 mg  150 mg Oral BID Synthia Ewing, RPH   150 mg at 12/20/23 0102   levETIRAcetam  (KEPPRA ) tablet 500 mg  500 mg Oral BID Armenta Landau, MD   500 mg at 12/20/23 7253   LORazepam  (ATIVAN ) injection 1 mg  1 mg Intravenous Daily PRN Armenta Landau, MD       methocarbamol  (ROBAXIN ) tablet 500 mg  500 mg Oral Q8H PRN Armenta Landau, MD   500 mg at 12/19/23 2138   metolazone  (ZAROXOLYN ) tablet 2.5 mg  2.5 mg Oral Weekly Armenta Landau, MD   2.5 mg at 12/19/23 6644   midodrine  (PROAMATINE ) tablet 5 mg  5 mg Oral TID WC Armenta Landau, MD   5 mg at 12/20/23 0347   omega-3 acid ethyl esters (LOVAZA) capsule 2 g  2 g Oral BID Armenta Landau, MD   2 g at 12/20/23 4259   ondansetron  (ZOFRAN ) tablet 4 mg  4 mg Oral Q6H PRN Armenta Landau, MD       Or   ondansetron  (ZOFRAN ) injection 4 mg  4 mg Intravenous Q6H PRN Armenta Landau, MD       pantoprazole  (PROTONIX ) EC tablet 40 mg  40 mg Oral Daily Armenta Landau, MD   40 mg at 12/20/23 0814   polyethylene glycol (MIRALAX  / GLYCOLAX ) packet 17 g  17 g Oral Daily PRN Armenta Landau, MD       polyethylene glycol (MIRALAX  / GLYCOLAX ) packet 17 g  17 g Oral Daily Armenta Landau, MD   17 g at 12/17/23 5638   potassium chloride  SA (KLOR-CON  M) CR tablet 40 mEq  40 mEq Oral BID Akula, Vijaya, MD   40 mEq at 12/20/23 0946   prazosin  (MINIPRESS ) capsule 2 mg  2 mg Oral QHS Armenta Landau, MD   2 mg at 12/19/23 2138   pregabalin  (LYRICA ) capsule 150 mg  150 mg Oral TID Armenta Landau, MD   150 mg at 12/20/23 7564   rOPINIRole  (REQUIP ) tablet 0.25 mg  0.25 mg Oral BID Armenta Landau, MD   0.25 mg at 12/20/23  1610   senna-docusate (Senokot-S) tablet 2 tablet  2 tablet Oral BID  Armenta Landau, MD   2 tablet at 12/17/23 9604   sodium chloride  flush (NS) 0.9 % injection 3 mL  3 mL Intravenous Once Thompson, Daniel V, MD       sodium chloride  flush (NS) 0.9 % injection 3 mL  3 mL Intravenous Q12H Armenta Landau, MD   3 mL at 12/20/23 0815   sorbitol  70 % solution 30 mL  30 mL Oral Daily PRN Armenta Landau, MD       spironolactone  (ALDACTONE ) tablet 12.5 mg  12.5 mg Oral Daily Armenta Landau, MD   12.5 mg at 12/20/23 5409   torsemide  (DEMADEX ) tablet 20 mg  20 mg Oral BID Armenta Landau, MD   20 mg at 12/20/23 8119   traZODone  (DESYREL ) tablet 150 mg  150 mg Oral QHS Armenta Landau, MD   150 mg at 12/19/23 2138   umeclidinium bromide  (INCRUSE ELLIPTA ) 62.5 MCG/ACT 1 puff  1 puff Inhalation Daily Armenta Landau, MD   1 puff at 12/20/23 1478   venlafaxine  XR (EFFEXOR -XR) 24 hr capsule 150 mg  150 mg Oral Q breakfast Armenta Landau, MD   150 mg at 12/20/23 2956     Discharge Medications: Please see discharge summary for a list of discharge medications.  Relevant Imaging Results:  Relevant Lab Results:   Additional Information SS#: [id number]  Tandy Fam, LCSW

## 2023-12-20 NOTE — TOC Transition Note (Signed)
 Transition of Care [facility]) - Discharge Note   Patient Details  Name: Lawrence [name] MRN: [id number] Date of Birth: Jul 16, 1956  [facility] CM/SW Contact:  Tandy Fam, LCSW Phone Number: 12/20/2023, 1:47 PM   Clinical Narrative:   CSW updated by MD that patient can be discharged today, MRI canceled. CSW updated [facility], they are ready for patient to return. CSW sent discharge information and confirmed bed is ready for patient. Patient in agreement to return to SNF today. Transport arranged with [facility] for next available.  Nurse to call report to [phone].    Final next level of care: Skilled Nursing Facility Barriers to Discharge: Barriers Resolved   Patient Goals and CMS Choice            Discharge Placement              Patient chooses bed at:  [facility]) Patient to be transferred to facility by: [facility] Name of family member notified: Self Patient and family notified of of transfer: 12/20/23  Discharge Plan and Services Additional resources added to the After Visit Summary for       Post Acute Care Choice: Skilled Nursing Facility                               Social Drivers of Health (SDOH) Interventions SDOH Screenings   Food Insecurity: No Food Insecurity (12/15/2023)  Housing: High Risk (12/15/2023)  Transportation Needs: No Transportation Needs (12/15/2023)  Recent Concern: Transportation Needs - Unmet Transportation Needs (10/03/2023)  Utilities: Not At Risk (12/15/2023)  Alcohol  Screen: Low Risk  (06/08/2021)  Financial Resource Strain: Medium Risk (12/06/2021)   Received from [facility], [facility]  Physical Activity: Unknown (12/06/2021)   Received from [facility], [facility]  Social Connections: Socially Isolated (12/15/2023)  Stress: No Stress Concern Present (01/21/2022)   Received from [facility], [facility]  Recent Concern: Stress - Stress Concern Present (11/19/2021)   Received from [facility]   Tobacco Use: Medium Risk (12/15/2023)     Readmission Risk Interventions    11/28/2023    2:45 PM 07/24/2023   11:56 AM 05/08/2023    4:37 PM  Readmission Risk Prevention Plan  Transportation Screening Complete Complete Complete  Medication Review (RN Care Manager) Complete Complete Complete  PCP or Specialist appointment within 3-5 days of discharge Complete Complete Complete  HRI or Home Care Consult Complete Complete Complete  SW Recovery Care/Counseling Consult Complete Complete Complete  Palliative Care Screening Not Applicable Not Applicable Complete  Skilled Nursing Facility Complete Complete Not Applicable

## 2023-12-20 NOTE — Discharge Summary (Signed)
 Physician Discharge Summary   Patient: Lawrence [name] MRN: [id number] DOB: 01/24/1956  Admit date:     12/15/2023  Discharge date: 12/20/23  Discharge Physician: Feliciana Horn   PCP: Darnelle Elders, PA-C   Recommendations at discharge:  Please follow up with PCP in one week.  Please follow up with cbc and bmp in 2 days.   Discharge Diagnoses: Principal Problem:   Acute left-sided weakness Active Problems:   Type 2 diabetes mellitus with obesity (HCC)   COPD (chronic obstructive pulmonary disease) (HCC)   AF (paroxysmal atrial fibrillation) (HCC)   Chronic diastolic CHF (congestive heart failure) (HCC)   S/P TAVR (transcatheter aortic valve replacement)   Seizure disorder (HCC)   Class 3 obesity   OSA (obstructive sleep apnea)   Obesity, Class III, BMI 40-49.9 (morbid obesity)   PTSD (post-traumatic stress disorder)   BPH without obstruction/lower urinary tract symptoms   Peripheral neuropathy   Physical deconditioning   Pure hypercholesterolemia   Chronic venous stasis   Diabetic neuropathy (HCC)   DM2 (diabetes mellitus, type 2) (HCC)   Primary hypertension   Coronary artery disease due to lipid rich plaque   Stroke-like symptoms   Depression   Stage 3a chronic kidney disease (HCC)   MDD (major depressive disorder), recurrent severe, without psychosis (HCC)   UTI (urinary tract infection)   Drowsy  Resolved Problems:   * No resolved hospital problems. [facility] Course: Patient 68 year old gentleman history of COPD, severe aortic stenosis status post TAVR July 2024, seizure disorder, chronic diastolic CHF, A-fib on Eliquis , CVA, gout, OSA on CPAP, anxiety, BPH, chronic venous stasis, morbid obesity presenting with worsening left-sided weakness from his baseline, slurred speech concern for acute CVA. CT head done unremarkable. Patient seen in consultation by neurology who recommended MRI of the brain. Patient however with a spinal cord stimulator, wife trying to  locate remote and as such MRI unable to to be done yet. Urinalysis concerning for UTI patient placed on IV antibiotics.   Assessment and Plan:  #1 left-sided weakness: Acute ischemic CVA versus recrudescence of prior CVA in the setting of UTI -Patient with prior history of CVA with some residual left-sided weakness presenting with sudden onset worsening left-sided weakness from baseline, slurred speech, concern for possible facial droop. - Patient brought in as a code stroke. - CT head done negative for any acute abnormalities. -Chest x-ray done negative for any acute abnormalities. -Urinalysis pending. - Patient seen in consultation by neurology who recommended MRI of the brain without contrast and if negative no further workup needed. -Patient with spinal cord stimulator however wife unable to locate remote. -Patient seen by neurology and recommending MRI to be done when [facility] representative is able to bring in remote for nerve stimulator per neurology. -EEG done with mild diffuse slowing indicative of global cerebral dysfunction.  Epileptiform abnormalities not seen. -Patient with clinical improvement. -Continue home regimen Lipitor, Tricor , Lovaza, Eliquis . -PT/OT. -Likely needs SNF. -Neurology following and I appreciate their input and recommendations.   2.  A-fib - Currently rate controlled.  - Patient noted not on rate limiting medications prior to admission.  - Continue Eliquis  for anticoagulation.   3.  UTI -Urine cultures > 100,000 colonies of Klebsiella pneumonia which is sensitive to the cephalosporins, fluoroquinolones, gentamicin, imipenem, Bactrim , Zosyn  and Unasyn and resistant to ampicillin .  - Transition from IV Rocephin  to Duricef to complete course of antibiotic treatment.    4.  Diabetes mellitus type 2 -Hemoglobin A1c noted  at 6.6 on 10/04/2023. - CBG noted at 121 this morning.      5.  Depression//anxiety - Continue Abilify , Effexor .   -Decreased  BuSpar  to 5 mg 3 times daily as per wife and RN patient with some bouts of sleepiness intermittently. -Outpatient follow-up.   6.  Chronic diastolic heart failure/CAD/status post TAVR - Currently stable. -Euvolemic on examination. - Continue home cardiac regimen of Lipitor, spironolactone , Tricor , Lovaza, Zaroxolyn , Demadex .   7.  CKD stage IIIa -Stable.   8.  Seizure disorder -Stable. - EEG obtained negative for any epileptiform discharges noted.  -Continue home regimen Keppra , lacosamide .    9.  COPD - Continue Incruse Ellipta , Dulera.    10.  Obesity class III -Lifestyle modification - Outpatient follow-up with PCP.   11.  OSA - Continue CPAP nightly.     #12 drowsiness -Patient noted with intermittent bouts of sleepiness and drowsiness however easily arousable and following commands. -Likely secondary to polypharmacy. -Patient with clinical improvement after adjustment of some of his home medications.  -Home regimen BuSpar  has been decreased to 5 mg 3 times daily. - Remeron  discontinued and will not resume on discharge.  -Trazodone  has been decreased to 150 mg nightly which we will continue for now. -Ammonia levels were in normal limits. -Supportive care.       Consultants: neurology Procedures performed: none.   Disposition: Skilled nursing facility Diet recommendation:  Carb modified diet DISCHARGE MEDICATION: Allergies as of 12/20/2023       Reactions   Niacin And Related Other (See Comments)   Redness, flushing   Ppd [tuberculin Purified Protein Derivative]    Unknown rxn (MAR)   Tubersol [tuberculin, Ppd] Other (See Comments)   Unknown rxn (MAR)   Firvanq  [vancomycin ] Other (See Comments)   Red man syndrome   Vibramycin  [doxycycline ] Rash        Medication List     STOP taking these medications    linezolid  600 MG tablet Commonly known as: ZYVOX    metoprolol  succinate 50 MG 24 hr tablet Commonly known as: TOPROL -XL   mirtazapine  15 MG  tablet Commonly known as: REMERON        TAKE these medications    acetaminophen  650 MG CR tablet Commonly known as: TYLENOL  Take 650 mg by mouth every 8 (eight) hours as needed for pain.   albuterol  108 (90 Base) MCG/ACT inhaler Commonly known as: VENTOLIN  HFA Inhale 2 puffs into the lungs every 6 (six) hours as needed for wheezing or shortness of breath.   allopurinol  100 MG tablet Commonly known as: Zyloprim  Take 1 tablet (100 mg total) by mouth daily.   apixaban  5 MG Tabs tablet Commonly known as: Eliquis  Take 1 tablet (5 mg total) by mouth 2 (two) times daily.   ARIPiprazole  5 MG tablet Commonly known as: ABILIFY  TAKE 1 TABLET BY MOUTH EVERYDAY AT BEDTIME   atorvastatin  80 MG tablet Commonly known as: LIPITOR Take 1 tablet (80 mg total) by mouth at bedtime. What changed:  medication strength how much to take   bisacodyl  5 MG EC tablet Commonly known as: DULCOLAX Take 10 mg by mouth daily as needed for moderate constipation.   busPIRone  5 MG tablet Commonly known as: BUSPAR  Take 1 tablet (5 mg total) by mouth 3 (three) times daily. What changed:  medication strength how much to take   cefadroxil  500 MG capsule Commonly known as: DURICEF Take 2 capsules (1,000 mg total) by mouth 2 (two) times daily for 2 days.  colchicine  0.6 MG tablet Take 1 tablet (0.6 mg total) by mouth daily.   cyanocobalamin  1000 MCG tablet Commonly known as: VITAMIN B12 Take 1,000 mcg by mouth daily.   fenofibrate  145 MG tablet Commonly known as: Tricor  Take 1 tablet (145 mg total) by mouth daily.   ferrous sulfate  325 (65 FE) MG tablet Take 325 mg by mouth daily.   fluticasone  50 MCG/ACT nasal spray Commonly known as: FLONASE  Place 2 sprays into both nostrils daily.   guaiFENesin -dextromethorphan 100-10 MG/5ML syrup Commonly known as: ROBITUSSIN DM Take 5 mLs by mouth every 4 (four) hours as needed for cough.   HumaLOG KwikPen 100 UNIT/ML KwikPen Generic drug: insulin   lispro Inject 2-15 Units into the skin. 121-150 2 units 151-200 3 units  201-250 5 units  251-300 8 units  301-350 11 units  351-400 15 units   HYDROcodone -acetaminophen  5-325 MG tablet Commonly known as: NORCO/VICODIN Take 1 tablet by mouth every 6 (six) hours as needed for severe pain (pain score 7-10).   Incruse Ellipta  62.5 MCG/ACT Aepb Generic drug: umeclidinium bromide  Inhale 1 puff into the lungs daily.   insulin  aspart 100 UNIT/ML injection Commonly known as: novoLOG  Inject 0-15 Units into the skin 3 (three) times daily with meals. Sliding scale  CBG 70 - 120: 0 units: CBG 121 - 150: 2 units; CBG 151 - 200: 3 units; CBG 201 - 250: 5 units; CBG 251 - 300: 8 units;CBG 301 - 350: 11 units; CBG 351 - 400: 15 units; CBG > 400 : 15 units and notify MD   Lacosamide  150 MG Tabs Take 1 tablet (150 mg total) by mouth 2 (two) times daily.   levETIRAcetam  500 MG tablet Commonly known as: KEPPRA  Take 1 tablet (500 mg total) by mouth 2 (two) times daily.   loperamide  2 MG capsule Commonly known as: IMODIUM  Take 2 mg by mouth daily as needed for diarrhea or loose stools.   methocarbamol  500 MG tablet Commonly known as: ROBAXIN  Take 500 mg by mouth every 8 (eight) hours as needed for muscle spasms.   metolazone  2.5 MG tablet Commonly known as: ZAROXOLYN  Take 2.5 mg by mouth once a week. Every Tuesday.   midodrine  5 MG tablet Commonly known as: PROAMATINE  Take 1 tablet (5 mg total) by mouth 3 (three) times daily with meals.   Mounjaro  15 MG/0.5ML Pen Generic drug: tirzepatide  INJECT 15 MG INTO THE SKIN ONCE A WEEK. What changed: See the new instructions.   omega-3 acid ethyl esters 1 g capsule Commonly known as: LOVAZA Take 2 g by mouth 2 (two) times daily.   omeprazole 20 MG capsule Commonly known as: PRILOSEC Take 20 mg by mouth daily.   polyethylene glycol powder 17 GM/SCOOP powder Commonly known as: GLYCOLAX /MIRALAX  Mix in water as directed and take 1 capful (17  g) by mouth daily.   potassium chloride  SA 20 MEQ tablet Commonly known as: Klor-Con  M20 Take 1-2 tablets (20-40 mEq total) by mouth See admin instructions. Take 2 tablets in the morning and 1 tablet in the evening What changed: additional instructions   prazosin  2 MG capsule Commonly known as: MINIPRESS  Take 2 mg by mouth at bedtime.   pregabalin  150 MG capsule Commonly known as: LYRICA  Take 1 capsule (150 mg total) by mouth 3 (three) times daily.   rOPINIRole  0.25 MG tablet Commonly known as: REQUIP  Take 1 tablet (0.25 mg total) by mouth 2 (two) times daily.   sennosides-docusate sodium  8.6-50 MG tablet Commonly known as: SENOKOT-S Take  2 tablets by mouth in the morning and at bedtime.   spironolactone  25 MG tablet Commonly known as: ALDACTONE  Take 12.5 mg by mouth daily.   torsemide  20 MG tablet Commonly known as: DEMADEX  Take 1 tablet (20 mg total) by mouth 2 (two) times daily.   traZODone  150 MG tablet Commonly known as: DESYREL  Take 1 tablet (150 mg total) by mouth at bedtime. What changed:  medication strength how much to take   venlafaxine  XR 150 MG 24 hr capsule Commonly known as: EFFEXOR -XR Take 150 mg by mouth daily with breakfast.        Contact information for follow-up providers     Cassandra Cleveland, MD. Schedule an appointment as soon as possible for a visit in 1 month(s).   Specialty: Neurology Contact information: [address] [place] [address] [phone]              Contact information for after-discharge care     Destination     [facility] .   Service: Skilled Nursing Contact information: [address] [place]  [address] [phone]                    Discharge Exam: Cleavon Curls Weights   12/15/23 1018  Weight: (!) 155 kg   General exam: Appears calm and comfortable  Respiratory system: Clear to auscultation. Respiratory effort normal. Cardiovascular system: S1 & S2 heard,  RRR. No JVD,  Gastrointestinal system: Abdomen is nondistended, soft and nontender. Central nervous system: Alert and oriented.  Extremities: Symmetric 5 x 5 power. Skin: No rashes, lesions or ulcers Psychiatry: Mood & affect appropriate.    Condition at discharge: fair  The results of significant diagnostics from this hospitalization (including imaging, microbiology, ancillary and laboratory) are listed below for reference.   Imaging Studies: CT ANGIO HEAD NECK W WO CM Result Date: 12/18/2023 CLINICAL DATA:  Stroke/TIA EXAM: CT ANGIOGRAPHY HEAD AND NECK WITH AND WITHOUT CONTRAST TECHNIQUE: Multidetector CT imaging of the head and neck was performed using the standard protocol during bolus administration of intravenous contrast. Multiplanar CT image reconstructions and MIPs were obtained to evaluate the vascular anatomy. Carotid stenosis measurements (when applicable) are obtained utilizing NASCET criteria, using the distal internal carotid diameter as the denominator. RADIATION DOSE REDUCTION: This exam was performed according to the departmental dose-optimization program which includes automated exposure control, adjustment of the mA and/or kV according to patient size and/or use of iterative reconstruction technique. CONTRAST:  75mL OMNIPAQUE  IOHEXOL  350 MG/ML SOLN COMPARISON:  12/15/2023 FINDINGS: CT HEAD FINDINGS Brain: No mass,hemorrhage or extra-axial collection. Normal appearance of the parenchyma and CSF spaces. Vascular: No hyperdense vessel or unexpected vascular calcification. Skull: The visualized skull base, calvarium and extracranial soft tissues are normal. Sinuses/Orbits: No fluid levels or advanced mucosal thickening of the visualized paranasal sinuses. No mastoid or middle ear effusion. Normal orbits. CTA NECK FINDINGS Skeleton: No acute abnormality or high grade bony spinal canal stenosis. Other neck: Normal pharynx, larynx and major salivary glands. No cervical lymphadenopathy.  Unremarkable thyroid  gland. Upper chest: No pneumothorax or pleural effusion. No nodules or masses. Aortic arch: There is calcific atherosclerosis of the aortic arch. Normal variant aortic arch branching pattern with the brachiocephalic and left common carotid arteries sharing a common origin. RIGHT carotid system: No dissection, occlusion or aneurysm. Mild atherosclerotic calcification at the carotid bifurcation without hemodynamically significant stenosis. LEFT carotid system: No dissection, occlusion or aneurysm. Mild atherosclerotic calcification at the carotid bifurcation without hemodynamically  significant stenosis. Vertebral arteries: Left dominant configuration. There is no dissection, occlusion or flow-limiting stenosis to the skull base (V1-V3 segments). CTA HEAD FINDINGS POSTERIOR CIRCULATION: Vertebral arteries are normal. No proximal occlusion of the anterior or inferior cerebellar arteries. Basilar artery is normal. Superior cerebellar arteries are normal. Posterior cerebral arteries are normal. ANTERIOR CIRCULATION: Intracranial internal carotid arteries are normal. Anterior cerebral arteries are normal. Middle cerebral arteries are normal. Venous sinuses: As permitted by contrast timing, patent. Anatomic variants: None Review of the MIP images confirms the above findings. IMPRESSION: 1. No emergent large vessel occlusion or hemodynamically significant stenosis of the head or neck. 2. Mild bilateral carotid bifurcation atherosclerosis without hemodynamically significant stenosis. Aortic Atherosclerosis (ICD10-I70.0). Electronically Signed   By: Juanetta Nordmann M.D.   On: 12/18/2023 23:14   EEG adult Result Date: 12/16/2023 Eleni Griffin, MD     12/16/2023  3:49 PM Routine EEG Report DEADRIAN QUENNEVILLE is a 68 y.o. male with a history of seizure who is undergoing an EEG to evaluate for seizures. Report: This EEG was acquired with electrodes placed according to the International 10-20 electrode system  (including Fp1, Fp2, F3, F4, C3, C4, P3, P4, O1, O2, T3, T4, T5, T6, A1, A2, Fz, Cz, Pz). The following electrodes were missing or displaced: none. The occipital dominant rhythm was 6-7 Hz. This activity is reactive to stimulation. Drowsiness was manifested by background fragmentation; deeper stages of sleep were identified by K complexes and sleep spindles. There was no focal slowing. There were no interictal epileptiform discharges. There were no electrographic seizures identified. Photic stimulation and hyperventilation were not performed. Impression and clinical correlation: This EEG was obtained while awake and asleep and is abnormal due to mild diffuse slowing indicative of global cerebral dysfunction. Epileptiform abnormalities were not seen during this recording. Greg Leaks, MD [facility] [phone] If 7pm- 7am, please page neurology on call as listed in AMION.   DG Chest Portable 1 View Result Date: 12/15/2023 CLINICAL DATA:  Cough EXAM: PORTABLE CHEST 1 VIEW COMPARISON:  Chest radiograph 11/28/2023 FINDINGS: Cardiomegaly. Monitoring leads overlie the patient. No large area pulmonary consolidation. No pleural effusion or pneumothorax. Mild pulmonary vascular redistribution. IMPRESSION: Cardiomegaly with mild pulmonary vascular redistribution. Electronically Signed   By: Jone Neither M.D.   On: 12/15/2023 16:16   CT HEAD CODE STROKE WO CONTRAST Result Date: 12/15/2023 CLINICAL DATA:  Code stroke. Neuro deficit, acute, stroke suspected. Left-sided facial droop. EXAM: CT HEAD WITHOUT CONTRAST TECHNIQUE: Contiguous axial images were obtained from the base of the skull through the vertex without intravenous contrast. RADIATION DOSE REDUCTION: This exam was performed according to the departmental dose-optimization program which includes automated exposure control, adjustment of the mA and/or kV according to patient size and/or use of iterative reconstruction technique. COMPARISON:  Prior head  CT examinations 11/27/2023 and earlier. Brain MRI 07/14/2023. FINDINGS: Brain: Mild generalized parenchymal atrophy. There is no acute intracranial hemorrhage. No demarcated cortical infarct. No extra-axial fluid collection. No evidence of an intracranial mass. No midline shift. Vascular: No hyperdense vessel.  Atherosclerotic calcifications. Skull: No calvarial fracture or aggressive osseous lesion. Sinuses/Orbits: No mass or acute finding within the imaged orbits. Minimal mucosal thickening within the bilateral maxillary sinuses. Minimal mucosal thickening, and small-volume secretions, within the right sphenoid sinus. Mild mucosal thickening within the bilateral ethmoid sinuses. Minimal mucosal thickening within the frontal sinuses. ASPECTS [facility] Stroke Program Early CT Score) - Ganglionic level infarction (caudate, lentiform nuclei, internal capsule, insula, M1-M3 cortex): 7 - Supraganglionic infarction (  M4-M6 cortex): 3 Total score (0-10 with 10 being normal): 10 No evidence of an acute intracranial abnormality. These results were communicated to Dr. Murvin Arthurs At 9:54 amon 5/9/2025by text page via the [facility] messaging system. IMPRESSION: 1. No evidence of an acute intracranial abnormality. 2. Mild generalized parenchymal atrophy. 3. Paranasal sinus disease as described. Electronically Signed   By: Bascom Lily D.O.   On: 12/15/2023 09:54   ECHO TEE Result Date: 12/07/2023    TRANSESOPHOGEAL ECHO REPORT   Patient Name:   CAROLL MCGATH Date of Exam: 12/07/2023 Medical Rec #:  [id number]        Height:       74.0 in Accession #:    [id number]       Weight:       341.9 lb Date of Birth:  08/21/1955        BSA:          2.730 m Patient Age:    67 years         BP:           139/89 mmHg Patient Gender: M                HR:           83 bpm. Exam Location:  Inpatient Procedure: Transesophageal Echo, 3D Echo, Color Doppler and Cardiac Doppler            (Both Spectral and Color Flow Doppler were utilized during             procedure). Indications:     Fever  History:         Patient has prior history of Echocardiogram examinations, most                  recent 11/29/2023. CHF, COPD; Risk Factors:Hypertension,                  Diabetes, Dyslipidemia and Sleep Apnea.                  Aortic Valve: 34 Medtronic Evolut FX valve is present in the                  aortic position. Procedure Date: 02/07/23.  Sonographer:     Sherline Distel Senior RDCS Referring Phys:  Marcie Sever, NICOLE Diagnosing Phys: Gloriann Larger MD PROCEDURE: After discussion of the risks and benefits of a TEE, an informed consent was obtained from the patient. The transesophogeal probe was passed without difficulty through the esophogus of the patient. Sedation performed by different physician. The patient was monitored while under deep sedation. Anesthestetic sedation was provided intravenously by Anesthesiology: 140mg  of Propofol . The patient developed no complications during the procedure.  IMPRESSIONS  1. Left ventricular ejection fraction, by estimation, is 55 to 60%. The left ventricle has normal function.  2. Right ventricular systolic function is normal. The right ventricular size is normal.  3. No left atrial/left atrial appendage thrombus was detected. The LAA emptying velocity was 52 cm/s.  4. A small pericardial effusion is present. The pericardial effusion is localized near the right ventricle.  5. The mitral valve is normal in structure. Trivial mitral valve regurgitation. No evidence of mitral stenosis.  6. The aortic valve has been repaired/replaced. Aortic valve regurgitation is not visualized. No aortic stenosis is present. There is a 34 Medtronic Evolut FX valve present in the aortic position. Procedure Date: 02/07/23. Echo findings are consistent with normal structure and function  of the aortic valve prosthesis.  7. 3D performed of the aortic valve and 3D performed of the mitral valve and demonstrates No vegetations noted.  8. Cannot exclude a  small PFO. FINDINGS  Left Ventricle: Left ventricular ejection fraction, by estimation, is 55 to 60%. The left ventricle has normal function. The left ventricular internal cavity size was normal in size. There is no left ventricular hypertrophy. Right Ventricle: The right ventricular size is normal. No increase in right ventricular wall thickness. Right ventricular systolic function is normal. Left Atrium: Left atrial size was normal in size. No left atrial/left atrial appendage thrombus was detected. The LAA emptying velocity was 52 cm/s. Right Atrium: Right atrial size was normal in size. Pericardium: A small pericardial effusion is present. The pericardial effusion is localized near the right ventricle. Mitral Valve: The mitral valve is normal in structure. Trivial mitral valve regurgitation. No evidence of mitral valve stenosis. Tricuspid Valve: The tricuspid valve is normal in structure. Tricuspid valve regurgitation is mild . No evidence of tricuspid stenosis. Aortic Valve: CoreValve appears under-expanded. Despite this no evidence of PVL or central AI. No vegetation seen on 2D or 3D imaging. The aortic valve has been repaired/replaced. Aortic valve regurgitation is not visualized. No aortic stenosis is present. Aortic valve mean gradient measures 7.0 mmHg. Aortic valve peak gradient measures 11.2 mmHg. There is a 34 Medtronic Evolut FX valve present in the aortic position. Procedure Date: 02/07/23. Echo findings are consistent with normal structure and function of the aortic valve prosthesis. Pulmonic Valve: The pulmonic valve was normal in structure. Pulmonic valve regurgitation is trivial. No evidence of pulmonic stenosis. Aorta: The aortic root, ascending aorta, aortic arch and descending aorta are all structurally normal, with no evidence of dilitation or obstruction. IAS/Shunts: The interatrial septum appears to be lipomatous. Cannot exclude a small PFO. Additional Comments: 3D was performed not requiring  image post processing on an independent workstation and was normal. Spectral Doppler performed. AORTIC VALVE AV Vmax:      167.00 cm/s AV Vmean:     122.000 cm/s AV VTI:       0.349 m AV Peak Grad: 11.2 mmHg AV Mean Grad: 7.0 mmHg Gloriann Larger MD Electronically signed by Gloriann Larger MD Signature Date/Time: 12/07/2023/2:06:31 PM    Final    EP STUDY Result Date: 12/07/2023 See surgical note for result.  ECHOCARDIOGRAM COMPLETE Result Date: 11/29/2023    ECHOCARDIOGRAM REPORT   Patient Name:   LITO PENT Date of Exam: 11/29/2023 Medical Rec #:  [id number]        Height:       74.0 in Accession #:    [id number]       Weight:       354.7 lb Date of Birth:  1955/08/11        BSA:          2.773 m Patient Age:    67 years         BP:           112/54 mmHg Patient Gender: M                HR:           53 bpm. Exam Location:  Inpatient Procedure: 2D Echo, Cardiac Doppler and Color Doppler (Both Spectral and Color            Flow Doppler were utilized during procedure). Indications:    Endocarditis  History:  Patient has prior history of Echocardiogram examinations, most                 recent 06/26/2023. CHF, CAD, COPD, Aortic Valve Disease; Risk                 Factors:Sleep Apnea, Diabetes and Hypertension.                 Aortic Valve: 34 Medtronic CoreValve-Evolut Pro prosthetic,                 stented (TAVR) valve is present in the aortic position.  Sonographer:    Astrid Blamer Referring Phys: Gibson Kurtz, N IMPRESSIONS  1. Left ventricular ejection fraction, by estimation, is 60 to 65%. The left ventricle has normal function. The left ventricle has no regional wall motion abnormalities. Left ventricular diastolic function could not be evaluated.  2. Right ventricular systolic function is normal. The right ventricular size is normal. Tricuspid regurgitation signal is inadequate for assessing PA pressure.  3. Moderate pericardial effusion. The pericardial effusion is circumferential.   4. The mitral valve is normal in structure. No evidence of mitral valve regurgitation. No evidence of mitral stenosis.  5. The aortic valve is normal in structure. Aortic valve regurgitation is not visualized. No aortic stenosis is present. There is a 34 Medtronic CoreValve-Evolut Pro prosthetic (TAVR) valve present in the aortic position. Aortic valve area, by VTI measures 1.58 cm. Aortic valve mean gradient measures 19.0 mmHg. Aortic valve Vmax measures 2.63 m/s.  6. The inferior vena cava is dilated in size with <50% respiratory variability, suggesting right atrial pressure of 15 mmHg. Conclusion(s)/Recommendation(s): Images are not adequate to rule out vegetation. Recommend TEE for further evaluation. FINDINGS  Left Ventricle: Left ventricular ejection fraction, by estimation, is 60 to 65%. The left ventricle has normal function. The left ventricle has no regional wall motion abnormalities. The left ventricular internal cavity size was normal in size. There is  no left ventricular hypertrophy. Left ventricular diastolic function could not be evaluated. Right Ventricle: The right ventricular size is normal. No increase in right ventricular wall thickness. Right ventricular systolic function is normal. Tricuspid regurgitation signal is inadequate for assessing PA pressure. Left Atrium: Left atrial size was normal in size. Right Atrium: Right atrial size was normal in size. Pericardium: A moderately sized pericardial effusion is present. The pericardial effusion is circumferential. Mitral Valve: The mitral valve is normal in structure. Mild mitral annular calcification. No evidence of mitral valve regurgitation. No evidence of mitral valve stenosis. Tricuspid Valve: The tricuspid valve is normal in structure. Tricuspid valve regurgitation is not demonstrated. No evidence of tricuspid stenosis. Aortic Valve: The aortic valve is normal in structure. Aortic valve regurgitation is not visualized. No aortic stenosis  is present. Aortic valve mean gradient measures 19.0 mmHg. Aortic valve peak gradient measures 27.7 mmHg. Aortic valve area, by VTI measures 1.58 cm. There is a 34 Medtronic CoreValve-Evolut Pro prosthetic, stented (TAVR) valve present in the aortic position. Pulmonic Valve: The pulmonic valve was normal in structure. Pulmonic valve regurgitation is not visualized. No evidence of pulmonic stenosis. Aorta: The aortic root is normal in size and structure. Venous: The inferior vena cava is dilated in size with less than 50% respiratory variability, suggesting right atrial pressure of 15 mmHg. IAS/Shunts: No atrial level shunt detected by color flow Doppler.  LEFT VENTRICLE PLAX 2D LVIDd:         4.90 cm LVIDs:  3.60 cm LV PW:         1.00 cm LV IVS:        1.20 cm LVOT diam:     1.80 cm LV SV:         97 LV SV Index:   35 LVOT Area:     2.54 cm  RIGHT VENTRICLE RV S prime:     15.70 cm/s TAPSE (M-mode): 2.9 cm LEFT ATRIUM              Index        RIGHT ATRIUM           Index LA Vol (A2C):   104.0 ml 37.51 ml/m  RA Area:     21.20 cm LA Vol (A4C):   59.0 ml  21.28 ml/m  RA Volume:   62.00 ml  22.36 ml/m LA Biplane Vol: 85.5 ml  30.84 ml/m  AORTIC VALVE AV Area (Vmax):    1.34 cm AV Area (Vmean):   1.39 cm AV Area (VTI):     1.58 cm AV Vmax:           263.00 cm/s AV Vmean:          212.000 cm/s AV VTI:            0.617 m AV Peak Grad:      27.7 mmHg AV Mean Grad:      19.0 mmHg LVOT Vmax:         138.00 cm/s LVOT Vmean:        116.000 cm/s LVOT VTI:          0.382 m LVOT/AV VTI ratio: 0.62  SHUNTS Systemic VTI:  0.38 m Systemic Diam: 1.80 cm Gaylyn Keas MD Electronically signed by Gaylyn Keas MD Signature Date/Time: 11/29/2023/4:10:14 PM    Final    EEG adult Result Date: 11/28/2023 Arleene Lack, MD     11/28/2023 12:53 PM Patient Name: LAIN HANIFF MRN: [id number] Epilepsy Attending: Arleene Lack Referring Physician/Provider: Cala Castleman, MD Date: 11/28/2023 Duration: 22.33 mins  Patient history: 68yo M has been having twitching in his right lower extremity for a few days. EEG to evaluate for seizure Level of alertness: Awake, asleep AEDs during EEG study: LEV, LCM Technical aspects: This EEG study was done with scalp electrodes positioned according to the 10-20 International system of electrode placement. Electrical activity was reviewed with band pass filter of 1-70Hz , sensitivity of 7 uV/mm, display speed of 55mm/sec with a 60Hz  notched filter applied as appropriate. EEG data were recorded continuously and digitally stored.  Video monitoring was available and reviewed as appropriate. Description: The posterior dominant rhythm consists of 7.5 Hz activity of moderate voltage (25-35 uV) seen predominantly in posterior head regions, symmetric and reactive to eye opening and eye closing. Sleep was characterized by vertex waves, sleep spindles (12 to 14 Hz), maximal frontocentral region. Physiologic photic driving was not seen during photic stimulation.  Hyperventilation was not performed.   IMPRESSION: This study is within normal limits. No seizures or definite epileptiform discharges were seen throughout the recording.  A normal interictal EEG does not exclude the diagnosis of epilepsy. Priyanka Suzanne Erps   DG Foot Complete Right Result Date: 11/28/2023 CLINICAL DATA:  Right foot pain EXAM: RIGHT FOOT COMPLETE - 3+ VIEW COMPARISON:  None Available. 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 IMPRESSION: Osteoarthrosis of the first metatarsophalangeal joint  with hallux valgus and bunion deformity. Electronically Signed   By: Fredrich Jefferson M.D.   On: 11/28/2023 12:02   DG Chest Port 1 View Result Date: 11/28/2023 CLINICAL DATA:  Shortness of breath EXAM: PORTABLE CHEST 1 VIEW COMPARISON:  11/27/2023 FINDINGS: Cardiomegaly with vascular congestion. Diffuse  interstitial prominence, favor interstitial edema. Probable layering effusions with bibasilar opacities, likely atelectasis. IMPRESSION: Cardiomegaly with vascular congestion and interstitial prominence, likely interstitial edema. Layering bilateral effusions with bibasilar atelectasis. Electronically Signed   By: Janeece Mechanic M.D.   On: 11/28/2023 10:01   CT CHEST ABDOMEN PELVIS W CONTRAST Result Date: 11/27/2023 CLINICAL DATA:  Poly trauma, fall from wheelchair. Anti coagulation. EXAM: CT CHEST, ABDOMEN, AND PELVIS WITH CONTRAST TECHNIQUE: Multidetector CT imaging of the chest, abdomen and pelvis was performed following the standard protocol during bolus administration of intravenous contrast. Due to patient body habitus, part of the abdomen was not included initial imaging, the patient was readministered contrast and reimaged with a larger field of view. RADIATION DOSE REDUCTION: This exam was performed according to the departmental dose-optimization program which includes automated exposure control, adjustment of the mA and/or kV according to patient size and/or use of iterative reconstruction technique. CONTRAST:  OMNIPAQUE  IOHEXOL  350 MG/ML SOLN COMPARISON:  10/02/2023 FINDINGS: CT CHEST FINDINGS Cardiovascular: Coronary, aortic arch, and branch vessel atherosclerotic vascular disease. TAVR noted. There is some linear high density in a left lower lobe pulmonary arterial branch on image 5 series 9, likely incidental embolized methacrylate. Mediastinum/Nodes: Unremarkable Lungs/Pleura: Emphysema. Mild lower lobe dependent atelectasis bilaterally. Musculoskeletal: Thoracic spondylosis. CT ABDOMEN PELVIS FINDINGS Hepatobiliary: Simple right hepatic lobe cyst on image 33 series 9 warrants no further imaging workup. Gallbladder unremarkable. Pancreas: Unremarkable Spleen: Unremarkable Adrenals/Urinary Tract: Benign right renal cysts warrant no further imaging workup. Adrenal glands appear normal. Urinary  bladder unremarkable. Stomach/Bowel: Sigmoid colon diverticulosis. Vascular/Lymphatic: Atherosclerosis is present, including aortoiliac atherosclerotic disease. Atheromatous plaque proximally in the SMA without overt occlusion. Reproductive: Unremarkable Other: No supplemental non-categorized findings. Musculoskeletal: Dorsal stimulator device noted electrodes at the T12-L1 level. Gluteal and upper thigh muscular atrophy with the exception of the hip adductor musculature. There is some paraspinal muscular atrophy and atrophy of the latissimus dorsi muscles. IMPRESSION: 1. No acute findings. 2. Aortic Atherosclerosis (ICD10-I70.0) and Emphysema (ICD10-J43.9). Coronary and systemic atherosclerosis. 3. Sigmoid colon diverticulosis. 4. Gluteal and upper thigh muscular atrophy with the exception of the hip adductor musculature. There is some paraspinal muscular atrophy and atrophy of the latissimus dorsi muscles. 5. Dorsal stimulator device noted electrodes at the T12-L1 level. Electronically Signed   By: Freida Jes M.D.   On: 11/27/2023 16:05   CT Head Wo Contrast Result Date: 11/27/2023 CLINICAL DATA:  Provided history: Head trauma, GCS = 15, no focal neuro findings. Neck trauma. Additional history provided: Fall on blood thinners. EXAM: CT HEAD WITHOUT CONTRAST CT CERVICAL SPINE WITHOUT CONTRAST TECHNIQUE: Multidetector CT imaging of the head and cervical spine was performed following the standard protocol without intravenous contrast. Multiplanar CT image reconstructions of the cervical spine were also generated. RADIATION DOSE REDUCTION: This exam was performed according to the departmental dose-optimization program which includes automated exposure control, adjustment of the mA and/or kV according to patient size and/or use of iterative reconstruction technique. COMPARISON:  Prior head CT examinations 10/10/2023 and earlier. Cervical spine MRI 10/09/2023. FINDINGS: CT HEAD FINDINGS Brain: Generalized  cerebral atrophy. There is no acute intracranial hemorrhage. No demarcated cortical infarct. No extra-axial fluid collection. No evidence of an intracranial mass.  No midline shift. Vascular: No hyperdense vessel.  Atherosclerotic calcifications. Skull: No calvarial fracture or aggressive osseous lesion. Sinuses/Orbits: No mass or acute finding within the imaged orbits. Minimal mucosal thickening within the bilateral maxillary sinuses. Trace mucosal thickening within the right sphenoid sinus. Mild-to-moderate mucosal thickening within the left sphenoid sinus. Mild-to-moderate bilateral ethmoid sinusitis. Mild-to-moderate mucosal thickening within the left frontal sinus. Other: Forehead hematoma on the right. CT CERVICAL SPINE FINDINGS Alignment: Levocurvature of the cervical spine. 3 mm grade 1 anterolisthesis at C2-C3, C3-C4 and C4-C5. Skull base and vertebrae: The basion-dental and atlanto-dental intervals are maintained.No evidence of acute fracture to the cervical spine. 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 Soft tissues and spinal canal: No prevertebral fluid or swelling. No visible canal hematoma. Disc levels: Cervical spondylosis with multilevel disc space narrowing, disc bulges/central disc protrusions, posterior disc osteophyte complexes, uncovertebral hypertrophy and facet arthropathy. Erosions are noted along the left C3-C4 facet joint. Disc degeneration is greatest at C5-C6, C6-C7 and C7-T1 (advanced at these levels). Multilevel spinal canal narrowing. Most notably at C3-C4, a disc bulge contributes to multifactorial severe spinal canal stenosis (as was demonstrated on the recent prior cervical spine MRI of 10/09/2023). Multilevel bony foraminal stenosis. Degenerative changes also present at the C1-C2 articulation. Upper chest: No consolidation within the imaged lung apices. No visible pneumothorax. IMPRESSION: CT head: 1.  No evidence of an acute intracranial abnormality. 2. Forehead hematoma on the  right. 3. Cerebral atrophy. 4. Paranasal sinus disease as described. CT cervical spine: 1. No evidence of an acute cervical spine fracture. 2. Grade 1 anterolisthesis at C2-C3, C3-C4 and C4-C5. 3. Levocurvature of the cervical spine. 4. Cervical spondylosis as described within the body of the report. Multifactorial severe spinal canal stenosis at C3-C4 (as was demonstrated on the recent prior cervical spine MRI of 10/09/2023). Multilevel bony neural foraminal narrowing. 5. Erosions noted along the left C3-C4 facet joint, a finding which can be seen in setting of CPPD arthropathy. 6. 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 Electronically Signed   By: Bascom Lily D.O.   On: 11/27/2023 13:27   CT Cervical Spine Wo Contrast Result Date: 11/27/2023 CLINICAL DATA:  Provided history: Head trauma, GCS = 15, no focal neuro findings. Neck trauma. Additional history provided: Fall on blood thinners. EXAM: CT HEAD WITHOUT CONTRAST CT CERVICAL SPINE WITHOUT CONTRAST TECHNIQUE: Multidetector CT imaging of the head and cervical spine was performed following the standard protocol without intravenous contrast. Multiplanar CT image reconstructions of the cervical spine were also generated. RADIATION DOSE REDUCTION: This exam was performed according to the departmental dose-optimization program which includes automated exposure control, adjustment of the mA and/or kV according to patient size and/or use of iterative reconstruction technique. COMPARISON:  Prior head CT examinations 10/10/2023 and earlier. Cervical spine MRI 10/09/2023. FINDINGS: CT HEAD FINDINGS Brain: Generalized cerebral atrophy. There is no acute intracranial hemorrhage. No demarcated cortical infarct. No extra-axial fluid collection. No evidence of an intracranial mass. No midline shift. Vascular: No hyperdense vessel.  Atherosclerotic calcifications. Skull: No calvarial fracture or aggressive osseous lesion. Sinuses/Orbits: No mass or acute finding within the  imaged orbits. Minimal mucosal thickening within the bilateral maxillary sinuses. Trace mucosal thickening within the right sphenoid sinus. Mild-to-moderate mucosal thickening within the left sphenoid sinus. Mild-to-moderate bilateral ethmoid sinusitis. Mild-to-moderate mucosal thickening within the left frontal sinus. Other: Forehead hematoma on the right. CT CERVICAL SPINE FINDINGS Alignment: Levocurvature of the cervical spine. 3 mm grade 1 anterolisthesis at C2-C3, C3-C4 and C4-C5. Skull  base and vertebrae: The basion-dental and atlanto-dental intervals are maintained.No evidence of acute fracture to the cervical spine. 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 Soft tissues and spinal canal: No prevertebral fluid or swelling. No visible canal hematoma. Disc levels: Cervical spondylosis with multilevel disc space narrowing, disc bulges/central disc protrusions, posterior disc osteophyte complexes, uncovertebral hypertrophy and facet arthropathy. Erosions are noted along the left C3-C4 facet joint. Disc degeneration is greatest at C5-C6, C6-C7 and C7-T1 (advanced at these levels). Multilevel spinal canal narrowing. Most notably at C3-C4, a disc bulge contributes to multifactorial severe spinal canal stenosis (as was demonstrated on the recent prior cervical spine MRI of 10/09/2023). Multilevel bony foraminal stenosis. Degenerative changes also present at the C1-C2 articulation. Upper chest: No consolidation within the imaged lung apices. No visible pneumothorax. IMPRESSION: CT head: 1.  No evidence of an acute intracranial abnormality. 2. Forehead hematoma on the right. 3. Cerebral atrophy. 4. Paranasal sinus disease as described. CT cervical spine: 1. No evidence of an acute cervical spine fracture. 2. Grade 1 anterolisthesis at C2-C3, C3-C4 and C4-C5. 3. Levocurvature of the cervical spine. 4. Cervical spondylosis as described within the body of the report. Multifactorial severe spinal canal stenosis at C3-C4 (as  was demonstrated on the recent prior cervical spine MRI of 10/09/2023). Multilevel bony neural foraminal narrowing. 5. Erosions noted along the left C3-C4 facet joint, a finding which can be seen in setting of CPPD arthropathy. 6. 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 Electronically Signed   By: Bascom Lily D.O.   On: 11/27/2023 13:27   DG Chest Port 1 View Result Date: 11/27/2023 CLINICAL DATA:  Sepsis EXAM: PORTABLE CHEST 1 VIEW COMPARISON:  10/20/2023. FINDINGS: Enlarged cardiopericardial silhouette. Tortuous ectatic aorta. No consolidation, pneumothorax or effusion. No edema. Prominent central vasculature. Film is under penetrated. Overlapping cardiac leads. Metallic focus along the left side of the heart. Status post TAVR. IMPRESSION: Underinflation with enlarged cardiopericardial silhouette. Prior TAVR. Electronically Signed   By: Adrianna Horde M.D.   On: 11/27/2023 11:30    Microbiology: Results for orders placed or performed during the hospital encounter of 12/15/23  Urine Culture (for pregnant, neutropenic or urologic patients or patients with an indwelling urinary catheter)     Status: Abnormal   Collection Time: 12/15/23  3:10 PM   Specimen: Urine, Catheterized  Result Value Ref Range Status   Specimen Description URINE, CATHETERIZED  Final   Special Requests   Final    NONE Performed at [facility], [address] [place], [place] [address]    Culture >=100,000 COLONIES/mL KLEBSIELLA PNEUMONIAE (A)  Final   Report Status 12/18/2023 FINAL  Final   Organism ID, Bacteria KLEBSIELLA PNEUMONIAE (A)  Final      Susceptibility   Klebsiella pneumoniae - MIC*    AMPICILLIN  >=32 RESISTANT Resistant     CEFAZOLIN  <=4 SENSITIVE Sensitive     CEFEPIME  <=0.12 SENSITIVE Sensitive     CEFTRIAXONE  <=0.25 SENSITIVE Sensitive     CIPROFLOXACIN <=0.25 SENSITIVE Sensitive     GENTAMICIN <=1 SENSITIVE Sensitive     IMIPENEM <=0.25 SENSITIVE Sensitive     NITROFURANTOIN 64 INTERMEDIATE  Intermediate     TRIMETH /SULFA  <=20 SENSITIVE Sensitive     AMPICILLIN /SULBACTAM 8 SENSITIVE Sensitive     PIP/TAZO 8 SENSITIVE Sensitive ug/mL    * >=100,000 COLONIES/mL KLEBSIELLA PNEUMONIAE    Labs: CBC: Recent Labs  Lab 12/15/23 0940 12/15/23 0943 12/16/23 0737 12/17/23 0708 12/18/23 0717  WBC 13.6*  --  9.3 6.2 4.5  NEUTROABS 11.0*  --   --   --  1.6*  HGB 13.9 14.6 13.5 13.4 14.1  HCT 40.7 43.0 41.3 40.5 42.2  MCV 88.7  --  90.6 90.8 89.0  PLT 207  --  199 175 195   Basic Metabolic Panel: Recent Labs  Lab 12/16/23 0737 12/17/23 0708 12/18/23 0717 12/19/23 0607 12/20/23 0547  NA 137 137 137 136 137  K 3.7 3.6 3.9 3.5 3.3*  CL 97* 100 100 102 99  CO2 26 27 25 25 28   GLUCOSE 214* 152* 142* 133* 188*  BUN 18 18 17 20 22   CREATININE 1.13 1.04 1.09 1.05 1.15  CALCIUM  8.8* 8.8* 9.2 9.3 9.1  MG 1.9  --   --   --   --   PHOS 3.0  --   --   --   --    Liver Function Tests: Recent Labs  Lab 12/15/23 0940  AST 16  ALT 22  ALKPHOS 56  BILITOT 0.9  PROT 6.7  ALBUMIN 3.4*   CBG: Recent Labs  Lab 12/19/23 1141 12/19/23 1639 12/19/23 2105 12/20/23 0618 12/20/23 1116  GLUCAP 129* 171* 170* 205* 191*    Discharge time spent: 41 minutes.   Signed: Feliciana Horn, MD [facility] 12/20/2023

## 2023-12-20 NOTE — Plan of Care (Signed)
  Problem: Education: Goal: Knowledge of risk factors and measures for prevention of condition will improve Outcome: Progressing   Problem: Pain Managment: Goal: General experience of comfort will improve and/or be controlled Outcome: Progressing   Problem: Education: Goal: Knowledge of secondary prevention will improve (MUST DOCUMENT ALL) Outcome: Progressing   Problem: Education: Goal: Knowledge of patient specific risk factors will improve (DELETE if not current risk factor) Outcome: Progressing   Problem: Ischemic Stroke/TIA Tissue Perfusion: Goal: Complications of ischemic stroke/TIA will be minimized Outcome: Progressing

## 2023-12-21 DIAGNOSIS — M6281 Muscle weakness (generalized): Secondary | ICD-10-CM | POA: Diagnosis not present

## 2023-12-21 DIAGNOSIS — R279 Unspecified lack of coordination: Secondary | ICD-10-CM | POA: Diagnosis not present

## 2023-12-22 DIAGNOSIS — I639 Cerebral infarction, unspecified: Secondary | ICD-10-CM | POA: Diagnosis not present

## 2023-12-22 DIAGNOSIS — R531 Weakness: Secondary | ICD-10-CM | POA: Diagnosis not present

## 2023-12-22 DIAGNOSIS — N390 Urinary tract infection, site not specified: Secondary | ICD-10-CM | POA: Diagnosis not present

## 2023-12-22 DIAGNOSIS — M6281 Muscle weakness (generalized): Secondary | ICD-10-CM | POA: Diagnosis not present

## 2023-12-22 DIAGNOSIS — R279 Unspecified lack of coordination: Secondary | ICD-10-CM | POA: Diagnosis not present

## 2023-12-23 DIAGNOSIS — I10 Essential (primary) hypertension: Secondary | ICD-10-CM | POA: Diagnosis not present

## 2023-12-25 DIAGNOSIS — M6281 Muscle weakness (generalized): Secondary | ICD-10-CM | POA: Diagnosis not present

## 2023-12-25 DIAGNOSIS — R279 Unspecified lack of coordination: Secondary | ICD-10-CM | POA: Diagnosis not present

## 2023-12-26 DIAGNOSIS — R279 Unspecified lack of coordination: Secondary | ICD-10-CM | POA: Diagnosis not present

## 2023-12-26 DIAGNOSIS — M6281 Muscle weakness (generalized): Secondary | ICD-10-CM | POA: Diagnosis not present

## 2023-12-27 DIAGNOSIS — R279 Unspecified lack of coordination: Secondary | ICD-10-CM | POA: Diagnosis not present

## 2023-12-27 DIAGNOSIS — M6281 Muscle weakness (generalized): Secondary | ICD-10-CM | POA: Diagnosis not present

## 2023-12-28 ENCOUNTER — Emergency Department (HOSPITAL_COMMUNITY)
Admission: EM | Admit: 2023-12-28 | Discharge: 2023-12-28 | Disposition: A | Discharge status: Home / Self Care | Attending: Emergency Medicine | Admitting: Emergency Medicine

## 2023-12-28 ENCOUNTER — Other Ambulatory Visit: Payer: Self-pay

## 2023-12-28 ENCOUNTER — Encounter (HOSPITAL_COMMUNITY): Payer: Self-pay

## 2023-12-28 ENCOUNTER — Emergency Department (HOSPITAL_COMMUNITY)

## 2023-12-28 DIAGNOSIS — R5381 Other malaise: Secondary | ICD-10-CM | POA: Insufficient documentation

## 2023-12-28 DIAGNOSIS — G8190 Hemiplegia, unspecified affecting unspecified side: Secondary | ICD-10-CM | POA: Diagnosis not present

## 2023-12-28 DIAGNOSIS — E119 Type 2 diabetes mellitus without complications: Secondary | ICD-10-CM | POA: Diagnosis not present

## 2023-12-28 DIAGNOSIS — I509 Heart failure, unspecified: Secondary | ICD-10-CM | POA: Diagnosis not present

## 2023-12-28 DIAGNOSIS — R279 Unspecified lack of coordination: Secondary | ICD-10-CM | POA: Diagnosis not present

## 2023-12-28 DIAGNOSIS — Z7901 Long term (current) use of anticoagulants: Secondary | ICD-10-CM | POA: Diagnosis not present

## 2023-12-28 DIAGNOSIS — Z794 Long term (current) use of insulin: Secondary | ICD-10-CM | POA: Insufficient documentation

## 2023-12-28 DIAGNOSIS — R600 Localized edema: Secondary | ICD-10-CM | POA: Diagnosis not present

## 2023-12-28 DIAGNOSIS — G4489 Other headache syndrome: Secondary | ICD-10-CM | POA: Diagnosis not present

## 2023-12-28 DIAGNOSIS — Z7401 Bed confinement status: Secondary | ICD-10-CM | POA: Diagnosis not present

## 2023-12-28 DIAGNOSIS — I10 Essential (primary) hypertension: Secondary | ICD-10-CM | POA: Diagnosis not present

## 2023-12-28 DIAGNOSIS — M6281 Muscle weakness (generalized): Secondary | ICD-10-CM | POA: Diagnosis not present

## 2023-12-28 DIAGNOSIS — Z743 Need for continuous supervision: Secondary | ICD-10-CM | POA: Diagnosis not present

## 2023-12-28 DIAGNOSIS — I959 Hypotension, unspecified: Secondary | ICD-10-CM | POA: Diagnosis not present

## 2023-12-28 DIAGNOSIS — I110 Hypertensive heart disease with heart failure: Secondary | ICD-10-CM | POA: Diagnosis not present

## 2023-12-28 DIAGNOSIS — R531 Weakness: Secondary | ICD-10-CM | POA: Diagnosis not present

## 2023-12-28 DIAGNOSIS — R030 Elevated blood-pressure reading, without diagnosis of hypertension: Secondary | ICD-10-CM

## 2023-12-28 LAB — COMPREHENSIVE METABOLIC PANEL WITH GFR
ALT: 23 U/L (ref 0–44)
AST: 19 U/L (ref 15–41)
Albumin: 3.9 g/dL (ref 3.5–5.0)
Alkaline Phosphatase: 49 U/L (ref 38–126)
Anion gap: 14 (ref 5–15)
BUN: 18 mg/dL (ref 8–23)
CO2: 27 mmol/L (ref 22–32)
Calcium: 10.2 mg/dL (ref 8.9–10.3)
Chloride: 100 mmol/L (ref 98–111)
Creatinine, Ser: 0.9 mg/dL (ref 0.61–1.24)
GFR, Estimated: >60 mL/min (ref >60)
Glucose, Bld: 136 mg/dL — ABNORMAL HIGH (ref 70–99)
Potassium: 3.9 mmol/L (ref 3.5–5.1)
Sodium: 141 mmol/L (ref 135–145)
Total Bilirubin: 0.7 mg/dL (ref 0.0–1.2)
Total Protein: 7.1 g/dL (ref 6.5–8.1)

## 2023-12-28 LAB — TROPONIN I (HIGH SENSITIVITY): Troponin I (High Sensitivity): 4 ng/L (ref <18)

## 2023-12-28 LAB — CBC
HCT: 46.7 % (ref 39.0–52.0)
Hemoglobin: 15.6 g/dL (ref 13.0–17.0)
MCH: 30.2 pg (ref 26.0–34.0)
MCHC: 33.4 g/dL (ref 30.0–36.0)
MCV: 90.3 fL (ref 80.0–100.0)
Platelets: 252 10*3/uL (ref 150–400)
RBC: 5.17 MIL/uL (ref 4.22–5.81)
RDW: 13 % (ref 11.5–15.5)
WBC: 9.9 10*3/uL (ref 4.0–10.5)
nRBC: 0 % (ref 0.0–0.2)

## 2023-12-28 LAB — URINALYSIS, ROUTINE W REFLEX MICROSCOPIC
Bilirubin Urine: NEGATIVE
Glucose, UA: 50 mg/dL — AB
Hgb urine dipstick: NEGATIVE
Ketones, ur: NEGATIVE mg/dL
Leukocytes,Ua: NEGATIVE
Nitrite: NEGATIVE
Protein, ur: NEGATIVE mg/dL
Specific Gravity, Urine: 1.01 (ref 1.005–1.030)
pH: 7 (ref 5.0–8.0)

## 2023-12-28 NOTE — ED Notes (Signed)
 [facility] called eta patient is 4th in line

## 2023-12-28 NOTE — ED Notes (Signed)
 Report given to [facility] patient transferred back to rehab

## 2023-12-28 NOTE — Discharge Instructions (Signed)
 It was our pleasure to provide your ER care today - we hope that you feel better. Drink adequate fluids/stay well hydrated.   Fall precautions.   Follow up closely with primary care doctor in the coming week - at follow up with your doctor, discuss working towards minimizing your medications, specifically minimizing your medications with sedating side effects.  Also follow up regarding your blood pressure that is high today.  Return to ER if worse, new symptoms, fevers, new/severe pain, chest pain, trouble breathing, new numbness/weakness, change in speech, fainting, or other emergency concern.

## 2023-12-28 NOTE — ED Triage Notes (Signed)
 Pt BIB [facility] from [facility] with c/o worsening left sided weakness. Pt has hx of L sided weakness from prior stroke. Per facility this began last week. VSS per EMS. Aox4.

## 2023-12-28 NOTE — ED Provider Notes (Signed)
 [place] EMERGENCY DEPARTMENT AT [facility] Provider Note   CSN: [id number] Arrival date & time: 12/28/23  1124     History  Chief Complaint  Patient presents with   Weakness    Lawrence [name] is a 68 y.o. male.  Pt with generalized weakness sent from SNF via EMS.  Per report, symptoms present for past week. Pt very limited historian - level 5 caveat - denies new complaint. No report of trauma/fall. No report of fevers.   The history is provided by the patient, medical records and the EMS personnel. The history is limited by the condition of the patient.  Weakness Associated symptoms: no abdominal pain, no chest pain, no cough, no dysuria, no fever, no headaches, no shortness of breath and no vomiting        Home Medications Prior to Admission medications   Medication Sig Start Date End Date Taking? Authorizing Provider  acetaminophen  (TYLENOL ) 650 MG CR tablet Take 650 mg by mouth every 8 (eight) hours as needed for pain.    [provider]  albuterol  (VENTOLIN  HFA) 108 (90 Base) MCG/ACT inhaler Inhale 2 puffs into the lungs every 6 (six) hours as needed for wheezing or shortness of breath. 04/17/23   Hunsucker, Archer Kobs, MD  allopurinol  (ZYLOPRIM ) 100 MG tablet Take 1 tablet (100 mg total) by mouth daily. 07/25/23 07/24/24  Elgergawy, Ardia Kraft, MD  apixaban  (ELIQUIS ) 5 MG TABS tablet Take 1 tablet (5 mg total) by mouth 2 (two) times daily. 04/17/23   Darlis Eisenmenger, MD  ARIPiprazole  (ABILIFY ) 5 MG tablet TAKE 1 TABLET BY MOUTH EVERYDAY AT BEDTIME 10/03/23   Mozingo, Regina Nattalie, NP  atorvastatin  (LIPITOR ) 80 MG tablet Take 1 tablet (80 mg total) by mouth at bedtime. 12/20/23   Feliciana Horn, MD  bisacodyl  (DULCOLAX) 5 MG EC tablet Take 10 mg by mouth daily as needed for moderate constipation.    [provider]  busPIRone  (BUSPAR ) 5 MG tablet Take 1 tablet (5 mg total) by mouth 3 (three) times daily. 12/20/23   Akula, Vijaya, MD  colchicine  0.6  MG tablet Take 1 tablet (0.6 mg total) by mouth daily. 07/12/23   Love, Renay Carota, PA-C  cyanocobalamin  (VITAMIN B12) 1000 MCG tablet Take 1,000 mcg by mouth daily.    [provider]  fenofibrate  (TRICOR ) 145 MG tablet Take 1 tablet (145 mg total) by mouth daily. 04/17/23   Darlis Eisenmenger, MD  ferrous sulfate  325 (65 FE) MG tablet Take 325 mg by mouth daily.    [provider]  fluticasone  (FLONASE ) 50 MCG/ACT nasal spray Place 2 sprays into both nostrils daily.     [provider]  guaiFENesin -dextromethorphan (ROBITUSSIN DM) 100-10 MG/5ML syrup Take 5 mLs by mouth every 4 (four) hours as needed for cough. 10/25/23   Rai, Ripudeep Linnell Richardson, MD  HUMALOG KWIKPEN 100 UNIT/ML KwikPen Inject 2-15 Units into the skin. 121-150 2 units 151-200 3 units  201-250 5 units  251-300 8 units  301-350 11 units  351-400 15 units 12/11/23   [provider]  HYDROcodone -acetaminophen  (NORCO/VICODIN) 5-325 MG tablet Take 1 tablet by mouth every 6 (six) hours as needed for severe pain (pain score 7-10). 12/08/23   Elgergawy, Ardia Kraft, MD  INCRUSE ELLIPTA  62.5 MCG/ACT AEPB Inhale 1 puff into the lungs daily. 12/11/23   [provider]  insulin  aspart (NOVOLOG ) 100 UNIT/ML injection Inject 0-15 Units into the skin 3 (three) times daily with meals. Sliding scale  CBG 70 - 120: 0 units: CBG 121 - 150: 2 units; CBG 151 - 200: 3 units; CBG 201 - 250: 5 units; CBG 251 - 300: 8 units;CBG 301 - 350: 11 units; CBG 351 - 400: 15 units; CBG > 400 : 15 units and notify MD Patient not taking: Reported on 12/15/2023 10/25/23   Rai, Hurman Maiden, MD  Lacosamide  150 MG TABS Take 1 tablet (150 mg total) by mouth 2 (two) times daily. 10/10/23   Verlyn Goad, MD  levETIRAcetam  (KEPPRA ) 500 MG tablet Take 1 tablet (500 mg total) by mouth 2 (two) times daily. 05/04/23   Cassandra Cleveland, MD  loperamide  (IMODIUM ) 2 MG capsule Take 2 mg by mouth daily as needed for diarrhea or loose stools.    [provider]   methocarbamol  (ROBAXIN ) 500 MG tablet Take 500 mg by mouth every 8 (eight) hours as needed for muscle spasms.    [provider]  metolazone  (ZAROXOLYN ) 2.5 MG tablet Take 2.5 mg by mouth once a week. Every Tuesday. 09/20/23   [provider]  midodrine  (PROAMATINE ) 5 MG tablet Take 1 tablet (5 mg total) by mouth 3 (three) times daily with meals. 12/08/23   Elgergawy, Ardia Kraft, MD  omega-3 acid ethyl esters (LOVAZA ) 1 g capsule Take 2 g by mouth 2 (two) times daily.    [provider]  omeprazole (PRILOSEC) 20 MG capsule Take 20 mg by mouth daily. 05/29/20   [provider]  polyethylene glycol powder (GLYCOLAX /MIRALAX ) 17 GM/SCOOP powder Mix in water as directed and take 1 capful (17 g) by mouth daily. 07/12/23   Love, Renay Carota, PA-C  potassium chloride  SA (KLOR-CON  M20) 20 MEQ tablet Take 1-2 tablets (20-40 mEq total) by mouth See admin instructions. Take 2 tablets in the morning and 1 tablet in the evening Patient taking differently: Take 20-40 mEq by mouth See admin instructions. Take 40 meq in the morning and 20 meq in the evening. 10/10/23   Verlyn Goad, MD  prazosin  (MINIPRESS ) 2 MG capsule Take 2 mg by mouth at bedtime. 08/17/23   [provider]  pregabalin  (LYRICA ) 150 MG capsule Take 1 capsule (150 mg total) by mouth 3 (three) times daily. 10/10/23   Verlyn Goad, MD  rOPINIRole  (REQUIP ) 0.25 MG tablet Take 1 tablet (0.25 mg total) by mouth 2 (two) times daily. 07/25/23   Elgergawy, Ardia Kraft, MD  sennosides-docusate sodium  (SENOKOT-S) 8.6-50 MG tablet Take 2 tablets by mouth in the morning and at bedtime.    [provider]  spironolactone  (ALDACTONE ) 25 MG tablet Take 12.5 mg by mouth daily. 09/01/23   [provider]  tirzepatide  (MOUNJARO ) 15 MG/0.5ML Pen INJECT 15 MG INTO THE SKIN ONCE A WEEK. Patient taking differently: Inject 15 mg into the skin once a week. Mondays 06/07/23   Jorge Newcomer, MD  torsemide  (DEMADEX ) 20 MG  tablet Take 1 tablet (20 mg total) by mouth 2 (two) times daily. 12/08/23   Elgergawy, Ardia Kraft, MD  traZODone  (DESYREL ) 150 MG tablet Take 1 tablet (150 mg total) by mouth at bedtime. 12/20/23   Akula, Vijaya, MD  venlafaxine  XR (EFFEXOR -XR) 150 MG 24 hr capsule Take 150 mg by mouth daily with breakfast.    [provider]      Allergies    Niacin and related; Ppd [tuberculin purified protein derivative]; Tubersol [tuberculin, ppd]; Firvanq  [vancomycin ]; and Vibramycin  [doxycycline ]    Review of Systems   Review of Systems  Constitutional:  Negative for fever.  HENT:  Negative for sore throat.   Eyes:  Negative for visual disturbance.  Respiratory:  Negative for cough and shortness of breath.   Cardiovascular:  Negative for chest pain.  Gastrointestinal:  Negative for abdominal pain, blood in stool and vomiting.  Genitourinary:  Negative for dysuria.  Musculoskeletal:  Negative for back pain and neck pain.  Neurological:  Positive for weakness. Negative for headaches.    Physical Exam Updated Vital Signs BP (!) 160/101 (BP Location: Right Arm)   Pulse 84   Temp 97.7 F (36.5 C) (Axillary)   Resp 16   SpO2 100%  Physical Exam Vitals and nursing note reviewed.  Constitutional:      Appearance: Normal appearance. He is well-developed.  HENT:     Head: Atraumatic.     Nose: Nose normal.     Mouth/Throat:     Mouth: Mucous membranes are moist.     Pharynx: Oropharynx is clear.  Eyes:     General: No scleral icterus.    Conjunctiva/sclera: Conjunctivae normal.     Pupils: Pupils are equal, round, and reactive to light.  Neck:     Vascular: No carotid bruit.     Trachea: No tracheal deviation.     Comments: Trachea midline, thryoid not grossly enlarged or tender. No neck stiffness or rigidity.  Cardiovascular:     Rate and Rhythm: Normal rate and regular rhythm.     Pulses: Normal pulses.     Heart sounds: Normal heart sounds. No murmur heard.    No friction rub. No  gallop.  Pulmonary:     Effort: Pulmonary effort is normal. No accessory muscle usage or respiratory distress.     Breath sounds: Normal breath sounds.  Abdominal:     General: Bowel sounds are normal. There is no distension.     Palpations: Abdomen is soft.     Tenderness: There is no abdominal tenderness.  Genitourinary:    Comments: No cva tenderness. Musculoskeletal:     Cervical back: Normal range of motion and neck supple. No rigidity.     Comments: Mild symmetric ankle/lower leg edema.   Skin:    General: Skin is warm and dry.     Findings: No rash.  Neurological:     Mental Status: He is alert.     Comments: Alert, speech clear. No gross facial asymmetry or weakness noted. Moves bil extremities. Stre left 5/5. Drift w LUE and 4/5 weakness LLE (findings appears very similar as noted on prior neurology evaluation).   Psychiatric:        Mood and Affect: Mood normal.     ED Results / Procedures / Treatments   Labs (all labs ordered are listed, but only abnormal results are displayed) Results for orders placed or performed during the hospital encounter of 12/28/23  CBC   Collection Time: 12/28/23  1:15 PM  Result Value Ref Range   WBC 9.9 4.0 - 10.5 K/uL   RBC 5.17 4.22 - 5.81 MIL/uL   Hemoglobin 15.6 13.0 - 17.0 g/dL   HCT 16.1 09.6 - 04.5 %   MCV 90.3 80.0 - 100.0 fL   MCH 30.2 26.0 - 34.0 pg   MCHC 33.4 30.0 - 36.0 g/dL   RDW 40.9 81.1 - 91.4 %   Platelets 252 150 - 400 K/uL   nRBC 0.0 0.0 - 0.2 %  Comprehensive metabolic panel with GFR   Collection Time: 12/28/23  1:15 PM  Result Value Ref  Range   Sodium 141 135 - 145 mmol/L   Potassium 3.9 3.5 - 5.1 mmol/L   Chloride 100 98 - 111 mmol/L   CO2 27 22 - 32 mmol/L   Glucose, Bld 136 (H) 70 - 99 mg/dL   BUN 18 8 - 23 mg/dL   Creatinine, Ser 1.61 0.61 - 1.24 mg/dL   Calcium  10.2 8.9 - 10.3 mg/dL   Total Protein 7.1 6.5 - 8.1 g/dL   Albumin 3.9 3.5 - 5.0 g/dL   AST 19 15 - 41 U/L   ALT 23 0 - 44 U/L    Alkaline Phosphatase 49 38 - 126 U/L   Total Bilirubin 0.7 0.0 - 1.2 mg/dL   GFR, Estimated >09 >60 mL/min   Anion gap 14 5 - 15  Troponin I (High Sensitivity)   Collection Time: 12/28/23  1:15 PM  Result Value Ref Range   Troponin I (High Sensitivity) 4 <18 ng/L  Urinalysis, Routine w reflex microscopic -Urine, Clean Catch   Collection Time: 12/28/23  1:49 PM  Result Value Ref Range   Color, Urine YELLOW YELLOW   APPearance CLEAR CLEAR   Specific Gravity, Urine 1.010 1.005 - 1.030   pH 7.0 5.0 - 8.0   Glucose, UA 50 (A) NEGATIVE mg/dL   Hgb urine dipstick NEGATIVE NEGATIVE   Bilirubin Urine NEGATIVE NEGATIVE   Ketones, ur NEGATIVE NEGATIVE mg/dL   Protein, ur NEGATIVE NEGATIVE mg/dL   Nitrite NEGATIVE NEGATIVE   Leukocytes,Ua NEGATIVE NEGATIVE   CT Head Wo Contrast Result Date: 12/28/2023 CLINICAL DATA:  Mental status change, unknown cause EXAM: CT HEAD WITHOUT CONTRAST TECHNIQUE: Contiguous axial images were obtained from the base of the skull through the vertex without intravenous contrast. RADIATION DOSE REDUCTION: This exam was performed according to the departmental dose-optimization program which includes automated exposure control, adjustment of the mA and/or kV according to patient size and/or use of iterative reconstruction technique. COMPARISON:  None Available. FINDINGS: Brain: Proportional prominence of the ventricles and sulci, consistent with diffuse cerebral parenchymal volume loss. The ventricles otherwise maintained midline position without midline shift. Gray-white differentiation is preserved without focal attenuation abnormality.No evidence of acute territorial infarction, extra-axial fluid collection, hemorrhage, or mass lesion. The basilar cisterns are patent without downward herniation. The cerebellar hemispheres and vermis are well formed without mass lesion or focal attenuation abnormality. Vascular: No hyperdense vessel. Skull: Normal. Negative for fracture or focal  lesion. Sinuses/Orbits: Small amount of layering, frothy fluid in the sphenoid sinuses with a few opacified ethmoid air cells. Small mucous retention cysts in the left maxillary sinus. The mastoids are clear. The globes appear intact. No retrobulbar hematoma. Other: None. IMPRESSION: 1. No acute intracranial abnormality, specifically, no acute hemorrhage, territorial infarction, or intracranial mass. 2. Small amount of layering fluid within the sphenoid sinuses with a few opacified air cells. Correlation for acute sinusitis recommended. Electronically Signed   By: Rance Burrows M.D.   On: 12/28/2023 15:09   CT ANGIO HEAD NECK W WO CM Result Date: 12/18/2023 CLINICAL DATA:  Stroke/TIA EXAM: CT ANGIOGRAPHY HEAD AND NECK WITH AND WITHOUT CONTRAST TECHNIQUE: Multidetector CT imaging of the head and neck was performed using the standard protocol during bolus administration of intravenous contrast. Multiplanar CT image reconstructions and MIPs were obtained to evaluate the vascular anatomy. Carotid stenosis measurements (when applicable) are obtained utilizing NASCET criteria, using the distal internal carotid diameter as the denominator. RADIATION DOSE REDUCTION: This exam was performed according to the departmental dose-optimization program which includes automated  exposure control, adjustment of the mA and/or kV according to patient size and/or use of iterative reconstruction technique. CONTRAST:  75mL OMNIPAQUE  IOHEXOL  350 MG/ML SOLN COMPARISON:  12/15/2023 FINDINGS: CT HEAD FINDINGS Brain: No mass,hemorrhage or extra-axial collection. Normal appearance of the parenchyma and CSF spaces. Vascular: No hyperdense vessel or unexpected vascular calcification. Skull: The visualized skull base, calvarium and extracranial soft tissues are normal. Sinuses/Orbits: No fluid levels or advanced mucosal thickening of the visualized paranasal sinuses. No mastoid or middle ear effusion. Normal orbits. CTA NECK FINDINGS Skeleton:  No acute abnormality or high grade bony spinal canal stenosis. Other neck: Normal pharynx, larynx and major salivary glands. No cervical lymphadenopathy. Unremarkable thyroid  gland. Upper chest: No pneumothorax or pleural effusion. No nodules or masses. Aortic arch: There is calcific atherosclerosis of the aortic arch. Normal variant aortic arch branching pattern with the brachiocephalic and left common carotid arteries sharing a common origin. RIGHT carotid system: No dissection, occlusion or aneurysm. Mild atherosclerotic calcification at the carotid bifurcation without hemodynamically significant stenosis. LEFT carotid system: No dissection, occlusion or aneurysm. Mild atherosclerotic calcification at the carotid bifurcation without hemodynamically significant stenosis. Vertebral arteries: Left dominant configuration. There is no dissection, occlusion or flow-limiting stenosis to the skull base (V1-V3 segments). CTA HEAD FINDINGS POSTERIOR CIRCULATION: Vertebral arteries are normal. No proximal occlusion of the anterior or inferior cerebellar arteries. Basilar artery is normal. Superior cerebellar arteries are normal. Posterior cerebral arteries are normal. ANTERIOR CIRCULATION: Intracranial internal carotid arteries are normal. Anterior cerebral arteries are normal. Middle cerebral arteries are normal. Venous sinuses: As permitted by contrast timing, patent. Anatomic variants: None Review of the MIP images confirms the above findings. IMPRESSION: 1. No emergent large vessel occlusion or hemodynamically significant stenosis of the head or neck. 2. Mild bilateral carotid bifurcation atherosclerosis without hemodynamically significant stenosis. Aortic Atherosclerosis (ICD10-I70.0). Electronically Signed   By: Juanetta Nordmann M.D.   On: 12/18/2023 23:14   EEG adult Result Date: 12/16/2023 Eleni Griffin, MD     12/16/2023  3:49 PM Routine EEG Report AAYANSH CODISPOTI is a 68 y.o. male with a history of seizure who  is undergoing an EEG to evaluate for seizures. Report: This EEG was acquired with electrodes placed according to the International 10-20 electrode system (including Fp1, Fp2, F3, F4, C3, C4, P3, P4, O1, O2, T3, T4, T5, T6, A1, A2, Fz, Cz, Pz). The following electrodes were missing or displaced: none. The occipital dominant rhythm was 6-7 Hz. This activity is reactive to stimulation. Drowsiness was manifested by background fragmentation; deeper stages of sleep were identified by K complexes and sleep spindles. There was no focal slowing. There were no interictal epileptiform discharges. There were no electrographic seizures identified. Photic stimulation and hyperventilation were not performed. Impression and clinical correlation: This EEG was obtained while awake and asleep and is abnormal due to mild diffuse slowing indicative of global cerebral dysfunction. Epileptiform abnormalities were not seen during this recording. Greg Leaks, MD [facility] [phone] If 7pm- 7am, please page neurology on call as listed in AMION.   DG Chest Portable 1 View Result Date: 12/15/2023 CLINICAL DATA:  Cough EXAM: PORTABLE CHEST 1 VIEW COMPARISON:  Chest radiograph 11/28/2023 FINDINGS: Cardiomegaly. Monitoring leads overlie the patient. No large area pulmonary consolidation. No pleural effusion or pneumothorax. Mild pulmonary vascular redistribution. IMPRESSION: Cardiomegaly with mild pulmonary vascular redistribution. Electronically Signed   By: Jone Neither M.D.   On: 12/15/2023 16:16   CT HEAD CODE STROKE WO CONTRAST Result Date: 12/15/2023 CLINICAL  DATA:  Code stroke. Neuro deficit, acute, stroke suspected. Left-sided facial droop. EXAM: CT HEAD WITHOUT CONTRAST TECHNIQUE: Contiguous axial images were obtained from the base of the skull through the vertex without intravenous contrast. RADIATION DOSE REDUCTION: This exam was performed according to the departmental dose-optimization program which includes  automated exposure control, adjustment of the mA and/or kV according to patient size and/or use of iterative reconstruction technique. COMPARISON:  Prior head CT examinations 11/27/2023 and earlier. Brain MRI 07/14/2023. FINDINGS: Brain: Mild generalized parenchymal atrophy. There is no acute intracranial hemorrhage. No demarcated cortical infarct. No extra-axial fluid collection. No evidence of an intracranial mass. No midline shift. Vascular: No hyperdense vessel.  Atherosclerotic calcifications. Skull: No calvarial fracture or aggressive osseous lesion. Sinuses/Orbits: No mass or acute finding within the imaged orbits. Minimal mucosal thickening within the bilateral maxillary sinuses. Minimal mucosal thickening, and small-volume secretions, within the right sphenoid sinus. Mild mucosal thickening within the bilateral ethmoid sinuses. Minimal mucosal thickening within the frontal sinuses. ASPECTS [facility] Stroke Program Early CT Score) - Ganglionic level infarction (caudate, lentiform nuclei, internal capsule, insula, M1-M3 cortex): 7 - Supraganglionic infarction (M4-M6 cortex): 3 Total score (0-10 with 10 being normal): 10 No evidence of an acute intracranial abnormality. These results were communicated to Dr. Murvin Arthurs At 9:54 amon 5/9/2025by text page via the [facility] messaging system. IMPRESSION: 1. No evidence of an acute intracranial abnormality. 2. Mild generalized parenchymal atrophy. 3. Paranasal sinus disease as described. Electronically Signed   By: Bascom Lily D.O.   On: 12/15/2023 09:54   ECHO TEE Result Date: 12/07/2023    TRANSESOPHOGEAL ECHO REPORT   Patient Name:   GRAY DOERING Date of Exam: 12/07/2023 Medical Rec #:  [id number]        Height:       74.0 in Accession #:    [id number]       Weight:       341.9 lb Date of Birth:  [birth year]        BSA:          2.730 m Patient Age:    67 years         BP:           139/89 mmHg Patient Gender: M                HR:           83 bpm. Exam Location:   Inpatient Procedure: Transesophageal Echo, 3D Echo, Color Doppler and Cardiac Doppler            (Both Spectral and Color Flow Doppler were utilized during            procedure). Indications:     Fever  History:         Patient has prior history of Echocardiogram examinations, most                  recent 11/29/2023. CHF, COPD; Risk Factors:Hypertension,                  Diabetes, Dyslipidemia and Sleep Apnea.                  Aortic Valve: 34 Medtronic Evolut FX valve is present in the                  aortic position. Procedure Date: 02/07/23.  Sonographer:     Sherline Distel Senior RDCS Referring Phys:  Marcie Sever, NICOLE Diagnosing Phys: Gloriann Larger MD  PROCEDURE: After discussion of the risks and benefits of a TEE, an informed consent was obtained from the patient. The transesophogeal probe was passed without difficulty through the esophogus of the patient. Sedation performed by different physician. The patient was monitored while under deep sedation. Anesthestetic sedation was provided intravenously by Anesthesiology: 140mg  of Propofol . The patient developed no complications during the procedure.  IMPRESSIONS  1. Left ventricular ejection fraction, by estimation, is 55 to 60%. The left ventricle has normal function.  2. Right ventricular systolic function is normal. The right ventricular size is normal.  3. No left atrial/left atrial appendage thrombus was detected. The LAA emptying velocity was 52 cm/s.  4. A small pericardial effusion is present. The pericardial effusion is localized near the right ventricle.  5. The mitral valve is normal in structure. Trivial mitral valve regurgitation. No evidence of mitral stenosis.  6. The aortic valve has been repaired/replaced. Aortic valve regurgitation is not visualized. No aortic stenosis is present. There is a 34 Medtronic Evolut FX valve present in the aortic position. Procedure Date: 02/07/23. Echo findings are consistent with normal structure and function of the  aortic valve prosthesis.  7. 3D performed of the aortic valve and 3D performed of the mitral valve and demonstrates No vegetations noted.  8. Cannot exclude a small PFO. FINDINGS  Left Ventricle: Left ventricular ejection fraction, by estimation, is 55 to 60%. The left ventricle has normal function. The left ventricular internal cavity size was normal in size. There is no left ventricular hypertrophy. Right Ventricle: The right ventricular size is normal. No increase in right ventricular wall thickness. Right ventricular systolic function is normal. Left Atrium: Left atrial size was normal in size. No left atrial/left atrial appendage thrombus was detected. The LAA emptying velocity was 52 cm/s. Right Atrium: Right atrial size was normal in size. Pericardium: A small pericardial effusion is present. The pericardial effusion is localized near the right ventricle. Mitral Valve: The mitral valve is normal in structure. Trivial mitral valve regurgitation. No evidence of mitral valve stenosis. Tricuspid Valve: The tricuspid valve is normal in structure. Tricuspid valve regurgitation is mild . No evidence of tricuspid stenosis. Aortic Valve: CoreValve appears under-expanded. Despite this no evidence of PVL or central AI. No vegetation seen on 2D or 3D imaging. The aortic valve has been repaired/replaced. Aortic valve regurgitation is not visualized. No aortic stenosis is present. Aortic valve mean gradient measures 7.0 mmHg. Aortic valve peak gradient measures 11.2 mmHg. There is a 34 Medtronic Evolut FX valve present in the aortic position. Procedure Date: 02/07/23. Echo findings are consistent with normal structure and function of the aortic valve prosthesis. Pulmonic Valve: The pulmonic valve was normal in structure. Pulmonic valve regurgitation is trivial. No evidence of pulmonic stenosis. Aorta: The aortic root, ascending aorta, aortic arch and descending aorta are all structurally normal, with no evidence of  dilitation or obstruction. IAS/Shunts: The interatrial septum appears to be lipomatous. Cannot exclude a small PFO. Additional Comments: 3D was performed not requiring image post processing on an independent workstation and was normal. Spectral Doppler performed. AORTIC VALVE AV Vmax:      167.00 cm/s AV Vmean:     122.000 cm/s AV VTI:       0.349 m AV Peak Grad: 11.2 mmHg AV Mean Grad: 7.0 mmHg Gloriann Larger MD Electronically signed by Gloriann Larger MD Signature Date/Time: 12/07/2023/2:06:31 PM    Final    EP STUDY Result Date: 12/07/2023 See surgical note for result.  ECHOCARDIOGRAM  COMPLETE Result Date: 11/29/2023    ECHOCARDIOGRAM REPORT   Patient Name:   COLLEEN DONAHOE Date of Exam: 11/29/2023 Medical Rec #:  [id number]        Height:       74.0 in Accession #:    [id number]       Weight:       354.7 lb Date of Birth:  1956/08/02        BSA:          2.773 m Patient Age:    67 years         BP:           112/54 mmHg Patient Gender: M                HR:           53 bpm. Exam Location:  Inpatient Procedure: 2D Echo, Cardiac Doppler and Color Doppler (Both Spectral and Color            Flow Doppler were utilized during procedure). Indications:    Endocarditis  History:        Patient has prior history of Echocardiogram examinations, most                 recent 06/26/2023. CHF, CAD, COPD, Aortic Valve Disease; Risk                 Factors:Sleep Apnea, Diabetes and Hypertension.                 Aortic Valve: 34 Medtronic CoreValve-Evolut Pro prosthetic,                 stented (TAVR) valve is present in the aortic position.  Sonographer:    Astrid Blamer Referring Phys: Gibson Kurtz, N IMPRESSIONS  1. Left ventricular ejection fraction, by estimation, is 60 to 65%. The left ventricle has normal function. The left ventricle has no regional wall motion abnormalities. Left ventricular diastolic function could not be evaluated.  2. Right ventricular systolic function is normal. The right ventricular  size is normal. Tricuspid regurgitation signal is inadequate for assessing PA pressure.  3. Moderate pericardial effusion. The pericardial effusion is circumferential.  4. The mitral valve is normal in structure. No evidence of mitral valve regurgitation. No evidence of mitral stenosis.  5. The aortic valve is normal in structure. Aortic valve regurgitation is not visualized. No aortic stenosis is present. There is a 34 Medtronic CoreValve-Evolut Pro prosthetic (TAVR) valve present in the aortic position. Aortic valve area, by VTI measures 1.58 cm. Aortic valve mean gradient measures 19.0 mmHg. Aortic valve Vmax measures 2.63 m/s.  6. The inferior vena cava is dilated in size with <50% respiratory variability, suggesting right atrial pressure of 15 mmHg. Conclusion(s)/Recommendation(s): Images are not adequate to rule out vegetation. Recommend TEE for further evaluation. FINDINGS  Left Ventricle: Left ventricular ejection fraction, by estimation, is 60 to 65%. The left ventricle has normal function. The left ventricle has no regional wall motion abnormalities. The left ventricular internal cavity size was normal in size. There is  no left ventricular hypertrophy. Left ventricular diastolic function could not be evaluated. Right Ventricle: The right ventricular size is normal. No increase in right ventricular wall thickness. Right ventricular systolic function is normal. Tricuspid regurgitation signal is inadequate for assessing PA pressure. Left Atrium: Left atrial size was normal in size. Right Atrium: Right atrial size was normal in size. Pericardium: A moderately sized pericardial effusion is present. The  pericardial effusion is circumferential. Mitral Valve: The mitral valve is normal in structure. Mild mitral annular calcification. No evidence of mitral valve regurgitation. No evidence of mitral valve stenosis. Tricuspid Valve: The tricuspid valve is normal in structure. Tricuspid valve regurgitation is not  demonstrated. No evidence of tricuspid stenosis. Aortic Valve: The aortic valve is normal in structure. Aortic valve regurgitation is not visualized. No aortic stenosis is present. Aortic valve mean gradient measures 19.0 mmHg. Aortic valve peak gradient measures 27.7 mmHg. Aortic valve area, by VTI measures 1.58 cm. There is a 34 Medtronic CoreValve-Evolut Pro prosthetic, stented (TAVR) valve present in the aortic position. Pulmonic Valve: The pulmonic valve was normal in structure. Pulmonic valve regurgitation is not visualized. No evidence of pulmonic stenosis. Aorta: The aortic root is normal in size and structure. Venous: The inferior vena cava is dilated in size with less than 50% respiratory variability, suggesting right atrial pressure of 15 mmHg. IAS/Shunts: No atrial level shunt detected by color flow Doppler.  LEFT VENTRICLE PLAX 2D LVIDd:         4.90 cm LVIDs:         3.60 cm LV PW:         1.00 cm LV IVS:        1.20 cm LVOT diam:     1.80 cm LV SV:         97 LV SV Index:   35 LVOT Area:     2.54 cm  RIGHT VENTRICLE RV S prime:     15.70 cm/s TAPSE (M-mode): 2.9 cm LEFT ATRIUM              Index        RIGHT ATRIUM           Index LA Vol (A2C):   104.0 ml 37.51 ml/m  RA Area:     21.20 cm LA Vol (A4C):   59.0 ml  21.28 ml/m  RA Volume:   62.00 ml  22.36 ml/m LA Biplane Vol: 85.5 ml  30.84 ml/m  AORTIC VALVE AV Area (Vmax):    1.34 cm AV Area (Vmean):   1.39 cm AV Area (VTI):     1.58 cm AV Vmax:           263.00 cm/s AV Vmean:          212.000 cm/s AV VTI:            0.617 m AV Peak Grad:      27.7 mmHg AV Mean Grad:      19.0 mmHg LVOT Vmax:         138.00 cm/s LVOT Vmean:        116.000 cm/s LVOT VTI:          0.382 m LVOT/AV VTI ratio: 0.62  SHUNTS Systemic VTI:  0.38 m Systemic Diam: 1.80 cm Gaylyn Keas MD Electronically signed by Gaylyn Keas MD Signature Date/Time: 11/29/2023/4:10:14 PM    Final     EKG EKG Interpretation Date/Time:  Thursday Dec 28 2023 12:17:48 EDT Ventricular  Rate:  90 PR Interval:  254 QRS Duration:  122 QT Interval:  390 QTC Calculation: 478 R Axis:   -68  Text Interpretation: Sinus rhythm Ventricular premature complex Prolonged PR interval IVCD, consider atypical RBBB No significant change since last tracing Confirmed by Guadalupe Lee ([id number]) on 12/28/2023 2:21:35 PM  Radiology CT Head Wo Contrast Result Date: 12/28/2023 CLINICAL DATA:  Mental status change, unknown cause EXAM: CT HEAD WITHOUT CONTRAST  TECHNIQUE: Contiguous axial images were obtained from the base of the skull through the vertex without intravenous contrast. RADIATION DOSE REDUCTION: This exam was performed according to the departmental dose-optimization program which includes automated exposure control, adjustment of the mA and/or kV according to patient size and/or use of iterative reconstruction technique. COMPARISON:  None Available. FINDINGS: Brain: Proportional prominence of the ventricles and sulci, consistent with diffuse cerebral parenchymal volume loss. The ventricles otherwise maintained midline position without midline shift. Gray-white differentiation is preserved without focal attenuation abnormality.No evidence of acute territorial infarction, extra-axial fluid collection, hemorrhage, or mass lesion. The basilar cisterns are patent without downward herniation. The cerebellar hemispheres and vermis are well formed without mass lesion or focal attenuation abnormality. Vascular: No hyperdense vessel. Skull: Normal. Negative for fracture or focal lesion. Sinuses/Orbits: Small amount of layering, frothy fluid in the sphenoid sinuses with a few opacified ethmoid air cells. Small mucous retention cysts in the left maxillary sinus. The mastoids are clear. The globes appear intact. No retrobulbar hematoma. Other: None. IMPRESSION: 1. No acute intracranial abnormality, specifically, no acute hemorrhage, territorial infarction, or intracranial mass. 2. Small amount of layering fluid  within the sphenoid sinuses with a few opacified air cells. Correlation for acute sinusitis recommended. Electronically Signed   By: Rance Burrows M.D.   On: 12/28/2023 15:09    Procedures Procedures    Medications Ordered in ED Medications - No data to display  ED Course/ Medical Decision Making/ A&P                                 Medical Decision Making Problems Addressed: Elevated blood pressure reading: acute illness or injury Essential hypertension: chronic illness or injury with exacerbation, progression, or side effects of treatment that poses a threat to life or bodily functions Generalized weakness: acute illness or injury with systemic symptoms that poses a threat to life or bodily functions Physical deconditioning: chronic illness or injury with exacerbation, progression, or side effects of treatment that poses a threat to life or bodily functions  Amount and/or Complexity of Data Reviewed Independent Historian: EMS    Details: hx External Data Reviewed: notes. Labs: ordered. Decision-making details documented in ED Course. Radiology: ordered and independent interpretation performed. Decision-making details documented in ED Course. ECG/medicine tests: ordered and independent interpretation performed. Decision-making details documented in ED Course.  Risk Decision regarding hospitalization.   Iv ns. Continuous pulse ox and cardiac monitoring. Labs ordered/sent. Imaging ordered.   Differential diagnosis includes cva, hem, anemia, uti, etc. Dispo decision including potential need for admission considered - will get labs and imaging and reassess.   Reviewed nursing notes and prior charts for additional history. External reports reviewed. Additional history from: EMS.  Prior chart/d/c summary, neurology note reviewed. Dnr on chart.   Cardiac monitor: sinus rhythm, rate 80.  Labs reviewed/interpreted by me - wbc and hgb normal. Chem normal. Ua neg for uti. Trop  normal.   CT reviewed/interpreted by me - no hem. Note patient without sinus symptoms or c/o, no purulent sinus drainage, no rhinorrhea, no sinus pain/tenderness.   On recheck, pt comfortable, no specific c/o, pain or distress. Pt is breathing comfortable. Rr 16, pulse ox 100%. Abd soft nt.   On current eval and recheck, pts condition appears very similar to what is note in d/c summary/recent eval.  Pt currently appears stable for ED d/c.   Rec close pcp f/u.  Return precautions provided.  Final Clinical Impression(s) / ED Diagnoses Final diagnoses:  None    Rx / DC Orders ED Discharge Orders     None         Guadalupe Lee, MD 12/28/23 1646

## 2023-12-28 NOTE — ED Notes (Signed)
Wife updated as to patient's status. 

## 2023-12-28 NOTE — ED Notes (Signed)
 Attempted x2 to obtain labwork. Phlebotomy contacted for blood draw.

## 2023-12-30 LAB — LEVETIRACETAM LEVEL: Levetiracetam Lvl: 13.6 ug/mL (ref 10.0–40.0)

## 2024-01-01 DIAGNOSIS — R279 Unspecified lack of coordination: Secondary | ICD-10-CM | POA: Diagnosis not present

## 2024-01-01 DIAGNOSIS — M6281 Muscle weakness (generalized): Secondary | ICD-10-CM | POA: Diagnosis not present

## 2024-01-02 DIAGNOSIS — M6281 Muscle weakness (generalized): Secondary | ICD-10-CM | POA: Diagnosis not present

## 2024-01-02 DIAGNOSIS — R279 Unspecified lack of coordination: Secondary | ICD-10-CM | POA: Diagnosis not present

## 2024-01-03 DIAGNOSIS — M6281 Muscle weakness (generalized): Secondary | ICD-10-CM | POA: Diagnosis not present

## 2024-01-03 DIAGNOSIS — I639 Cerebral infarction, unspecified: Secondary | ICD-10-CM | POA: Diagnosis not present

## 2024-01-03 DIAGNOSIS — R531 Weakness: Secondary | ICD-10-CM | POA: Diagnosis not present

## 2024-01-03 DIAGNOSIS — N390 Urinary tract infection, site not specified: Secondary | ICD-10-CM | POA: Diagnosis not present

## 2024-01-03 DIAGNOSIS — R279 Unspecified lack of coordination: Secondary | ICD-10-CM | POA: Diagnosis not present

## 2024-01-04 DIAGNOSIS — M6281 Muscle weakness (generalized): Secondary | ICD-10-CM | POA: Diagnosis not present

## 2024-01-04 DIAGNOSIS — R279 Unspecified lack of coordination: Secondary | ICD-10-CM | POA: Diagnosis not present

## 2024-01-05 DIAGNOSIS — M6281 Muscle weakness (generalized): Secondary | ICD-10-CM | POA: Diagnosis not present

## 2024-01-05 DIAGNOSIS — R279 Unspecified lack of coordination: Secondary | ICD-10-CM | POA: Diagnosis not present

## 2024-01-06 ENCOUNTER — Encounter (HOSPITAL_COMMUNITY): Payer: Self-pay

## 2024-01-06 ENCOUNTER — Emergency Department (HOSPITAL_COMMUNITY)

## 2024-01-06 ENCOUNTER — Emergency Department (HOSPITAL_COMMUNITY)
Admission: EM | Admit: 2024-01-06 | Discharge: 2024-01-06 | Disposition: A | Discharge status: Home / Self Care | Attending: Emergency Medicine | Admitting: Emergency Medicine

## 2024-01-06 DIAGNOSIS — Z043 Encounter for examination and observation following other accident: Secondary | ICD-10-CM | POA: Diagnosis not present

## 2024-01-06 DIAGNOSIS — W19XXXA Unspecified fall, initial encounter: Secondary | ICD-10-CM | POA: Diagnosis not present

## 2024-01-06 DIAGNOSIS — G8929 Other chronic pain: Secondary | ICD-10-CM | POA: Insufficient documentation

## 2024-01-06 DIAGNOSIS — M47812 Spondylosis without myelopathy or radiculopathy, cervical region: Secondary | ICD-10-CM | POA: Diagnosis not present

## 2024-01-06 DIAGNOSIS — Z7901 Long term (current) use of anticoagulants: Secondary | ICD-10-CM | POA: Insufficient documentation

## 2024-01-06 DIAGNOSIS — Z79899 Other long term (current) drug therapy: Secondary | ICD-10-CM | POA: Diagnosis not present

## 2024-01-06 DIAGNOSIS — S329XXA Fracture of unspecified parts of lumbosacral spine and pelvis, initial encounter for closed fracture: Secondary | ICD-10-CM | POA: Diagnosis not present

## 2024-01-06 DIAGNOSIS — Z743 Need for continuous supervision: Secondary | ICD-10-CM | POA: Diagnosis not present

## 2024-01-06 DIAGNOSIS — Z794 Long term (current) use of insulin: Secondary | ICD-10-CM | POA: Insufficient documentation

## 2024-01-06 DIAGNOSIS — L0390 Cellulitis, unspecified: Secondary | ICD-10-CM | POA: Diagnosis not present

## 2024-01-06 DIAGNOSIS — S0990XA Unspecified injury of head, initial encounter: Secondary | ICD-10-CM | POA: Insufficient documentation

## 2024-01-06 DIAGNOSIS — S3993XA Unspecified injury of pelvis, initial encounter: Secondary | ICD-10-CM | POA: Diagnosis present

## 2024-01-06 DIAGNOSIS — M4312 Spondylolisthesis, cervical region: Secondary | ICD-10-CM | POA: Diagnosis not present

## 2024-01-06 DIAGNOSIS — Z7401 Bed confinement status: Secondary | ICD-10-CM | POA: Diagnosis not present

## 2024-01-06 DIAGNOSIS — M5032 Other cervical disc degeneration, mid-cervical region, unspecified level: Secondary | ICD-10-CM | POA: Diagnosis not present

## 2024-01-06 MED ORDER — HYDROCODONE-ACETAMINOPHEN 5-325 MG PO TABS: 1.0000 | ORAL_TABLET | Freq: Once | ORAL | Status: DC

## 2024-01-06 NOTE — ED Triage Notes (Signed)
 Witnessed fall last night at [facility]. Staff reports NO LOC. Staff wanted him sent out for evaluation since the pt is on eliquis . HX of TIA and stroke, weakness is pt baseline. Uses wheelchair.

## 2024-01-06 NOTE — Discharge Instructions (Addendum)
 Lawrence [name]'s CT scan of the head and cervical spine did not show any emergency injuries.  He can follow up with his own provider as needed.

## 2024-01-06 NOTE — ED Notes (Signed)
 Pt placed on 3L [place] while sleeping as oxygen saturation were decreased into the 80s. HX of sleep apnea.

## 2024-01-06 NOTE — ED Provider Notes (Signed)
 [facility] EMERGENCY DEPARTMENT AT [facility] Provider Note   CSN: [id number] Arrival date & time: 01/06/24  4098     History  Chief Complaint  Patient presents with   Fall    On eliquis     ECHO ALLSBROOK is a 68 y.o. male on Eliquis  presenting by EMS from his nursing facility with a mechanical fall and head injury last night.  Patient reports he has a mild headache.  He struck his head [place].  He has chronic pain in his lower legs and sciatica radiculopathy in his right leg for which he is on muscle relaxers and Norco.  He denies pain anywhere else.  HPI     Home Medications Prior to Admission medications   Medication Sig Start Date End Date Taking? Authorizing Provider  acetaminophen  (TYLENOL ) 650 MG CR tablet Take 650 mg by mouth every 8 (eight) hours as needed for pain.    [provider]  albuterol  (VENTOLIN  HFA) 108 (90 Base) MCG/ACT inhaler Inhale 2 puffs into the lungs every 6 (six) hours as needed for wheezing or shortness of breath. 04/17/23   Hunsucker, Archer Kobs, MD  allopurinol  (ZYLOPRIM ) 100 MG tablet Take 1 tablet (100 mg total) by mouth daily. 07/25/23 07/24/24  Elgergawy, Ardia Kraft, MD  apixaban  (ELIQUIS ) 5 MG TABS tablet Take 1 tablet (5 mg total) by mouth 2 (two) times daily. 04/17/23   Darlis Eisenmenger, MD  ARIPiprazole  (ABILIFY ) 5 MG tablet TAKE 1 TABLET BY MOUTH EVERYDAY AT BEDTIME 10/03/23   Mozingo, Regina Nattalie, NP  atorvastatin  (LIPITOR ) 80 MG tablet Take 1 tablet (80 mg total) by mouth at bedtime. 12/20/23   Feliciana Horn, MD  bisacodyl  (DULCOLAX) 5 MG EC tablet Take 10 mg by mouth daily as needed for moderate constipation.    [provider]  busPIRone  (BUSPAR ) 5 MG tablet Take 1 tablet (5 mg total) by mouth 3 (three) times daily. 12/20/23   Akula, Vijaya, MD  colchicine  0.6 MG tablet Take 1 tablet (0.6 mg total) by mouth daily. 07/12/23   Love, Renay Carota, PA-C  cyanocobalamin  (VITAMIN B12) 1000 MCG tablet Take 1,000 mcg by mouth  daily.    [provider]  fenofibrate  (TRICOR ) 145 MG tablet Take 1 tablet (145 mg total) by mouth daily. 04/17/23   Darlis Eisenmenger, MD  ferrous sulfate  325 (65 FE) MG tablet Take 325 mg by mouth daily.    [provider]  fluticasone  (FLONASE ) 50 MCG/ACT nasal spray Place 2 sprays into both nostrils daily.     [provider]  guaiFENesin -dextromethorphan (ROBITUSSIN DM) 100-10 MG/5ML syrup Take 5 mLs by mouth every 4 (four) hours as needed for cough. 10/25/23   Rai, Ripudeep Linnell Richardson, MD  HUMALOG KWIKPEN 100 UNIT/ML KwikPen Inject 2-15 Units into the skin. 121-150 2 units 151-200 3 units  201-250 5 units  251-300 8 units  301-350 11 units  351-400 15 units 12/11/23   [provider]  HYDROcodone -acetaminophen  (NORCO/VICODIN) 5-325 MG tablet Take 1 tablet by mouth every 6 (six) hours as needed for severe pain (pain score 7-10). 12/08/23   Elgergawy, Ardia Kraft, MD  INCRUSE ELLIPTA  62.5 MCG/ACT AEPB Inhale 1 puff into the lungs daily. 12/11/23   [provider]  insulin  aspart (NOVOLOG ) 100 UNIT/ML injection Inject 0-15 Units into the skin 3 (three) times daily with meals. Sliding scale  CBG 70 - 120: 0 units: CBG 121 - 150: 2 units; CBG 151 - 200: 3 units; CBG 201 -  250: 5 units; CBG 251 - 300: 8 units;CBG 301 - 350: 11 units; CBG 351 - 400: 15 units; CBG > 400 : 15 units and notify MD Patient not taking: Reported on 12/15/2023 10/25/23   Rai, Hurman Maiden, MD  Lacosamide  150 MG TABS Take 1 tablet (150 mg total) by mouth 2 (two) times daily. 10/10/23   Verlyn Goad, MD  levETIRAcetam  (KEPPRA ) 500 MG tablet Take 1 tablet (500 mg total) by mouth 2 (two) times daily. 05/04/23   Cassandra Cleveland, MD  loperamide  (IMODIUM ) 2 MG capsule Take 2 mg by mouth daily as needed for diarrhea or loose stools.    [provider]  methocarbamol  (ROBAXIN ) 500 MG tablet Take 500 mg by mouth every 8 (eight) hours as needed for muscle spasms.    [provider]  metolazone   (ZAROXOLYN ) 2.5 MG tablet Take 2.5 mg by mouth once a week. Every Tuesday. 09/20/23   [provider]  midodrine  (PROAMATINE ) 5 MG tablet Take 1 tablet (5 mg total) by mouth 3 (three) times daily with meals. 12/08/23   Elgergawy, Ardia Kraft, MD  omega-3 acid ethyl esters (LOVAZA ) 1 g capsule Take 2 g by mouth 2 (two) times daily.    [provider]  omeprazole (PRILOSEC) 20 MG capsule Take 20 mg by mouth daily. 05/29/20   [provider]  polyethylene glycol powder (GLYCOLAX /MIRALAX ) 17 GM/SCOOP powder Mix in water as directed and take 1 capful (17 g) by mouth daily. 07/12/23   Love, Renay Carota, PA-C  potassium chloride  SA (KLOR-CON  M20) 20 MEQ tablet Take 1-2 tablets (20-40 mEq total) by mouth See admin instructions. Take 2 tablets in the morning and 1 tablet in the evening Patient taking differently: Take 20-40 mEq by mouth See admin instructions. Take 40 meq in the morning and 20 meq in the evening. 10/10/23   Verlyn Goad, MD  prazosin  (MINIPRESS ) 2 MG capsule Take 2 mg by mouth at bedtime. 08/17/23   [provider]  pregabalin  (LYRICA ) 150 MG capsule Take 1 capsule (150 mg total) by mouth 3 (three) times daily. 10/10/23   Verlyn Goad, MD  rOPINIRole  (REQUIP ) 0.25 MG tablet Take 1 tablet (0.25 mg total) by mouth 2 (two) times daily. 07/25/23   Elgergawy, Ardia Kraft, MD  sennosides-docusate sodium  (SENOKOT-S) 8.6-50 MG tablet Take 2 tablets by mouth in the morning and at bedtime.    [provider]  spironolactone  (ALDACTONE ) 25 MG tablet Take 12.5 mg by mouth daily. 09/01/23   [provider]  tirzepatide  (MOUNJARO ) 15 MG/0.5ML Pen INJECT 15 MG INTO THE SKIN ONCE A WEEK. Patient taking differently: Inject 15 mg into the skin once a week. Mondays 06/07/23   Jorge Newcomer, MD  torsemide  (DEMADEX ) 20 MG tablet Take 1 tablet (20 mg total) by mouth 2 (two) times daily. 12/08/23   Elgergawy, Ardia Kraft, MD  traZODone  (DESYREL ) 150 MG tablet Take 1 tablet (150 mg  total) by mouth at bedtime. 12/20/23   Akula, Vijaya, MD  venlafaxine  XR (EFFEXOR -XR) 150 MG 24 hr capsule Take 150 mg by mouth daily with breakfast.    [provider]      Allergies    Niacin and related; Ppd [tuberculin purified protein derivative]; Tubersol [tuberculin, ppd]; Firvanq  [vancomycin ]; and Vibramycin  [doxycycline ]    Review of Systems   Review of Systems  Physical Exam Updated Vital Signs BP 113/70   Pulse 89   Temp 98 F (36.7 C) (Oral)   Resp 13  Ht 6\' 2"  (1.88 m)   Wt (!) 154.2 kg   SpO2 90%   BMI 43.65 kg/m  Physical Exam Constitutional:      General: He is not in acute distress.    Appearance: He is obese.  HENT:     Head: Normocephalic and atraumatic.  Eyes:     Conjunctiva/sclera: Conjunctivae normal.     Pupils: Pupils are equal, round, and reactive to light.  Cardiovascular:     Rate and Rhythm: Normal rate and regular rhythm.  Pulmonary:     Effort: Pulmonary effort is normal. No respiratory distress.  Abdominal:     General: There is no distension.     Tenderness: There is no abdominal tenderness.  Skin:    General: Skin is warm and dry.  Neurological:     General: No focal deficit present.     Mental Status: He is alert. Mental status is at baseline.  Psychiatric:        Mood and Affect: Mood normal.        Behavior: Behavior normal.     ED Results / Procedures / Treatments   Labs (all labs ordered are listed, but only abnormal results are displayed) Labs Reviewed - No data to display  EKG None  Radiology CT Cervical Spine Wo Contrast Result Date: 01/06/2024 CLINICAL DATA:  68 year old male status post fall overnight. On Eliquis . EXAM: CT CERVICAL SPINE WITHOUT CONTRAST TECHNIQUE: Multidetector CT imaging of the cervical spine was performed without intravenous contrast. Multiplanar CT image reconstructions were also generated. RADIATION DOSE REDUCTION: This exam was performed according to the departmental  dose-optimization program which includes automated exposure control, adjustment of the mA and/or kV according to patient size and/or use of iterative reconstruction technique. COMPARISON:  Head CT today.  Cervical spine CT 11/27/2023. FINDINGS: Alignment: Stable straightening of cervical lordosis with mild degenerative anterolisthesis in the upper cervical spine, most pronounced at C3-C4. Cervicothoracic junction alignment is within normal limits. Maintained posterior element alignment. Skull base and vertebrae: Mild motion artifact. Bone mineralization is within normal limits. Visualized skull base is intact. No atlanto-occipital dissociation. C1 and C2 appear intact and aligned. No acute osseous abnormality identified. Soft tissues and spinal canal: No prevertebral fluid or swelling. No visible canal hematoma. Retropharyngeal course of the carotid arteries, normal variant. Stable visible noncontrast neck soft tissues. Disc levels: Cervical spine degeneration superimposed on degenerative ankylosis of the right C2-C3 facets. Adjacent facet arthropathy and spondylolisthesis at C3-C4. Bulky lower cervical spine disc and endplate degeneration. Stable findings since April. Upper chest: Stable, negative. IMPRESSION: 1. Mild motion artifact. No acute traumatic injury identified in the cervical spine. 2. 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 Electronically Signed   By: Marlise Simpers M.D.   On: 01/06/2024 08:56   CT Head Wo Contrast Result Date: 01/06/2024 CLINICAL DATA:  68 year old male status post fall overnight. On Eliquis . EXAM: CT HEAD WITHOUT CONTRAST TECHNIQUE: Contiguous axial images were obtained from the base of the skull through the vertex without intravenous contrast. RADIATION DOSE REDUCTION: This exam was performed according to the departmental dose-optimization program which includes automated exposure control, adjustment of the mA and/or kV according to patient size and/or  use of iterative reconstruction technique. COMPARISON:  Brain MRI 07/14/2023.  Head CT 12/28/2023. FINDINGS: Brain: No midline shift, ventriculomegaly, mass effect, evidence of mass lesion, intracranial hemorrhage or evidence of cortically based acute infarction. Gray-white differentiation is stable and within normal limits for age. Vascular: No suspicious intracranial vascular  hyperdensity. Calcified atherosclerosis at the skull base. Skull: Appears stable and intact. Sinuses/Orbits: Mild paranasal sinus mucosal thickening, layering low-density fluid is stable from earlier this month. Tympanic cavities and mastoids remain well aerated. Other: No acute orbit or scalp soft tissue injury identified. IMPRESSION: 1. No acute traumatic injury identified. Stable and negative for age noncontrast CT appearance of the brain. 2. Stable mild paranasal sinus inflammation. Electronically Signed   By: Marlise Simpers M.D.   On: 01/06/2024 08:53    Procedures Procedures    Medications Ordered in ED Medications - No data to display  ED Course/ Medical Decision Making/ A&P Clinical Course as of 01/06/24 1628  Sat Jan 06, 2024  0947 Transient borderline hypoxia while sleeping after norco but never below 88%, rapidly improves when awake.  Stable for discharge back to facility [MT]    Clinical Course User Index [MT] Lexianna Weinrich, Janalyn Me, MD                                 Medical Decision Making Amount and/or Complexity of Data Reviewed Radiology: ordered.   Patient is here with mechanical fall and isolated injury to the head.  Appears to be mentating near baseline levels per EMS.  He has chronic pain in his lower extremity but low suspicion for pelvic fracture or thoracic injury or abdominal injury.  Doubt spinal fracture.  CT imaging ordered, personally viewed interpreted, notable for no emergent findings  Patient is chronic Norco ordered for chronic pain of the lower extremity.  Supplemental history provided by  EMS.        Final Clinical Impression(s) / ED Diagnoses Final diagnoses:  Fall, initial encounter  Injury of head, initial encounter    Rx / DC Orders ED Discharge Orders     None         Tyreshia Ingman, Janalyn Me, MD 01/06/24 [phone]

## 2024-01-06 NOTE — Progress Notes (Signed)
 Chaplain responds to Level 2 trauma, fall on thinners, and provides compassionate presence as medical staff provide care. No family present.

## 2024-01-06 NOTE — Progress Notes (Signed)
 Orthopedic Tech Progress Note Patient Details:  Lawrence [name] Dec 18, 1955 [id number] Level 2 Trauma  Patient ID: [name], male   DOB: [birth year], 68 y.o.   MRN: [id number]  [name] 01/06/2024, 7:51 AM

## 2024-01-07 DIAGNOSIS — R279 Unspecified lack of coordination: Secondary | ICD-10-CM | POA: Diagnosis not present

## 2024-01-07 DIAGNOSIS — M6281 Muscle weakness (generalized): Secondary | ICD-10-CM | POA: Diagnosis not present

## 2024-01-08 DIAGNOSIS — R279 Unspecified lack of coordination: Secondary | ICD-10-CM | POA: Diagnosis not present

## 2024-01-08 DIAGNOSIS — M6281 Muscle weakness (generalized): Secondary | ICD-10-CM | POA: Diagnosis not present

## 2024-01-09 DIAGNOSIS — I4891 Unspecified atrial fibrillation: Secondary | ICD-10-CM | POA: Diagnosis not present

## 2024-01-09 DIAGNOSIS — G8194 Hemiplegia, unspecified affecting left nondominant side: Secondary | ICD-10-CM | POA: Diagnosis not present

## 2024-01-09 DIAGNOSIS — E119 Type 2 diabetes mellitus without complications: Secondary | ICD-10-CM | POA: Diagnosis not present

## 2024-01-09 DIAGNOSIS — I5033 Acute on chronic diastolic (congestive) heart failure: Secondary | ICD-10-CM | POA: Diagnosis not present

## 2024-01-09 DIAGNOSIS — Z8673 Personal history of transient ischemic attack (TIA), and cerebral infarction without residual deficits: Secondary | ICD-10-CM | POA: Diagnosis not present

## 2024-01-09 DIAGNOSIS — M6281 Muscle weakness (generalized): Secondary | ICD-10-CM | POA: Diagnosis not present

## 2024-01-09 DIAGNOSIS — W19XXXA Unspecified fall, initial encounter: Secondary | ICD-10-CM | POA: Diagnosis not present

## 2024-01-09 DIAGNOSIS — R279 Unspecified lack of coordination: Secondary | ICD-10-CM | POA: Diagnosis not present

## 2024-01-09 DIAGNOSIS — I639 Cerebral infarction, unspecified: Secondary | ICD-10-CM | POA: Diagnosis not present

## 2024-01-09 DIAGNOSIS — J449 Chronic obstructive pulmonary disease, unspecified: Secondary | ICD-10-CM | POA: Diagnosis not present

## 2024-01-10 DIAGNOSIS — R279 Unspecified lack of coordination: Secondary | ICD-10-CM | POA: Diagnosis not present

## 2024-01-10 DIAGNOSIS — M6281 Muscle weakness (generalized): Secondary | ICD-10-CM | POA: Diagnosis not present

## 2024-01-11 DIAGNOSIS — R279 Unspecified lack of coordination: Secondary | ICD-10-CM | POA: Diagnosis not present

## 2024-01-11 DIAGNOSIS — M6281 Muscle weakness (generalized): Secondary | ICD-10-CM | POA: Diagnosis not present

## 2024-01-12 ENCOUNTER — Other Ambulatory Visit: Payer: Self-pay

## 2024-01-12 DIAGNOSIS — M6281 Muscle weakness (generalized): Secondary | ICD-10-CM | POA: Diagnosis not present

## 2024-01-12 DIAGNOSIS — R279 Unspecified lack of coordination: Secondary | ICD-10-CM | POA: Diagnosis not present

## 2024-01-13 DIAGNOSIS — R262 Difficulty in walking, not elsewhere classified: Secondary | ICD-10-CM | POA: Diagnosis not present

## 2024-01-13 DIAGNOSIS — I63542 Cerebral infarction due to unspecified occlusion or stenosis of left cerebellar artery: Secondary | ICD-10-CM | POA: Diagnosis not present

## 2024-01-13 DIAGNOSIS — I69811 Memory deficit following other cerebrovascular disease: Secondary | ICD-10-CM | POA: Diagnosis not present

## 2024-01-13 DIAGNOSIS — I5030 Unspecified diastolic (congestive) heart failure: Secondary | ICD-10-CM | POA: Diagnosis not present

## 2024-01-13 DIAGNOSIS — M6281 Muscle weakness (generalized): Secondary | ICD-10-CM | POA: Diagnosis not present

## 2024-01-14 DIAGNOSIS — R279 Unspecified lack of coordination: Secondary | ICD-10-CM | POA: Diagnosis not present

## 2024-01-14 DIAGNOSIS — M6281 Muscle weakness (generalized): Secondary | ICD-10-CM | POA: Diagnosis not present

## 2024-01-15 DIAGNOSIS — G8194 Hemiplegia, unspecified affecting left nondominant side: Secondary | ICD-10-CM | POA: Diagnosis not present

## 2024-01-15 DIAGNOSIS — I4891 Unspecified atrial fibrillation: Secondary | ICD-10-CM | POA: Diagnosis not present

## 2024-01-15 DIAGNOSIS — Z8673 Personal history of transient ischemic attack (TIA), and cerebral infarction without residual deficits: Secondary | ICD-10-CM | POA: Diagnosis not present

## 2024-01-15 DIAGNOSIS — I639 Cerebral infarction, unspecified: Secondary | ICD-10-CM | POA: Diagnosis not present

## 2024-01-15 DIAGNOSIS — I5033 Acute on chronic diastolic (congestive) heart failure: Secondary | ICD-10-CM | POA: Diagnosis not present

## 2024-01-15 DIAGNOSIS — J449 Chronic obstructive pulmonary disease, unspecified: Secondary | ICD-10-CM | POA: Diagnosis not present

## 2024-01-15 DIAGNOSIS — R279 Unspecified lack of coordination: Secondary | ICD-10-CM | POA: Diagnosis not present

## 2024-01-15 DIAGNOSIS — E119 Type 2 diabetes mellitus without complications: Secondary | ICD-10-CM | POA: Diagnosis not present

## 2024-01-15 DIAGNOSIS — M6281 Muscle weakness (generalized): Secondary | ICD-10-CM | POA: Diagnosis not present

## 2024-01-16 DIAGNOSIS — K5900 Constipation, unspecified: Secondary | ICD-10-CM | POA: Diagnosis not present

## 2024-01-16 DIAGNOSIS — M6281 Muscle weakness (generalized): Secondary | ICD-10-CM | POA: Diagnosis not present

## 2024-01-16 DIAGNOSIS — R279 Unspecified lack of coordination: Secondary | ICD-10-CM | POA: Diagnosis not present

## 2024-01-16 DIAGNOSIS — R197 Diarrhea, unspecified: Secondary | ICD-10-CM | POA: Diagnosis not present

## 2024-01-17 ENCOUNTER — Ambulatory Visit: Payer: Medicare HMO

## 2024-01-17 ENCOUNTER — Other Ambulatory Visit (HOSPITAL_COMMUNITY): Payer: Medicare HMO

## 2024-01-17 DIAGNOSIS — R279 Unspecified lack of coordination: Secondary | ICD-10-CM | POA: Diagnosis not present

## 2024-01-17 DIAGNOSIS — M6281 Muscle weakness (generalized): Secondary | ICD-10-CM | POA: Diagnosis not present

## 2024-01-18 ENCOUNTER — Ambulatory Visit: Payer: Self-pay | Admitting: Physician Assistant

## 2024-01-18 ENCOUNTER — Ambulatory Visit (HOSPITAL_COMMUNITY): Payer: Self-pay

## 2024-01-18 ENCOUNTER — Telehealth: Payer: Self-pay | Admitting: Physician Assistant

## 2024-01-18 DIAGNOSIS — R279 Unspecified lack of coordination: Secondary | ICD-10-CM | POA: Diagnosis not present

## 2024-01-18 DIAGNOSIS — M6281 Muscle weakness (generalized): Secondary | ICD-10-CM | POA: Diagnosis not present

## 2024-01-18 NOTE — Telephone Encounter (Signed)
 Called patient to see if patient would like an opening for her echo and Abagail Hoar R with less of a gap in between. No answer LVM 01/18/2024 @2 :50pm.

## 2024-01-18 NOTE — Progress Notes (Deleted)
 [facility]   MULTIDISCIPLINARY HEART VALVE CLINIC                                     Cardiology Office Note:    Date:  01/18/2024   ID:  [name], DOB 1956-02-27, MRN [id number]  PCP:  Darnelle Elders, PA-C  [facility] Cardiologist:  Gaylyn Keas, MD  [facility] Structural heart: Kyra Phy, MD [facility] Electrophysiologist:  Will Cortland Ding, MD   Referring MD: Darnelle Elders, PA-C   1 year s/p TAVR  History of Present Illness:    Lawrence [name] is a 68 y.o. male with a hx of non-obstructive CAD, chronic diastolic CHF, paroxysmal atrial fibrillation on Eliquis , HTN, HLD, morbid obesity (BMI 46), T2DM with peripheral neuropathy, COPD, OSA on CPAP, seizure disorder, morbid obesity with a BMI of 47, prior tobacco use, CVA and severe aortic stenosis s/p TAVR (02/07/23) who presents to clinic for follow up.    Recently admitted 6/16-02/08/23 for acute hypoxic respiratory failure 2/2 acute CHF and severe AS. He had ongoing chest pain and shortness of breath. He was diuresed over 30L. Had dental extractions and started on Keflex . Underwent TAVR with a 34 mm Medtronic Evolut FX THV via the TF approach on 02/07/23. Post operative echo showed EF 60%, severe LVH, normally functioning TAVR with a mean gradient of 17 mmHg and no PVL and small pericardial effusion. He was discharged home on Eliquis  5 mg BID, Farixga 10mg  daily, spiro 12.5mg  daily, torsemide  80mg  BID. Also discharged with a Zio AT given a new LBBB and pre op RBBB. Zio patch did not show any HAVB. Given constellation of peripheral neuropathy, diastolic CHF and conduction system disease/atrial arrhythmias, he is underwent w/u to screen for cardiac amyloidosis. Multiple Myeloma = No M-spike, KFLC elevated 23.1, K/L LC Ratio normal 1.27. PYP scan was equivocal. Consider repeating in a 1-2 years.  Since TAVR he has been admitted to the hospital 7 times to the hospital. Admitted 06/2023 with acute CVA  and discharged to a SNF. Admitted in 12/2023 with sepsis and e coli bacteremia. TEE was performed 5/1, with no evidence of vegetation/endocarditis and normal TAVR valve function.     Today the patient presents to clinic for follow up.  Past Medical History:  Diagnosis Date   Acquired dilation of ascending aorta and aortic root (HCC)    40mm by echo 01/2021   Adenomatous colon polyp 2007   Anemia    Anxiety    Asthma    BPH without obstruction/lower urinary tract symptoms 02/22/2017   Chronic diastolic (congestive) heart failure (HCC)    Chronic venous stasis 03/07/2019   COPD (chronic obstructive pulmonary disease) (HCC)    Coronary artery calcification seen on CAT scan    Depression    Diabetic neuropathy (HCC) 09/11/2019   History of colon polyps 08/24/2018   Hypertension    Morbid obesity (HCC)    OSA (obstructive sleep apnea)    Pain due to onychomycosis of toenails of both feet 09/11/2019   Peripheral neuropathy 02/22/2017   Primary osteoarthritis, left shoulder 03/05/2017   PTSD (post-traumatic stress disorder)    Pure hypercholesterolemia    QT prolongation 03/07/2019   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 Lorie Rook and Dr. Sherene Dilling   Seizures [facility])    Severe aortic stenosis  Sinus tachycardia 03/07/2019   Sleep apnea    CPAP   Type 2 diabetes mellitus with vascular disease (HCC) 09/11/2019     Current Medications: No outpatient medications have been marked as taking for the 01/18/24 encounter (Appointment) with Ardia Kraft, PA-C.      ROS:   Please see the history of present illness.    All other systems reviewed and are negative.  EKGs       Risk Assessment/Calculations:   {Does this patient have ATRIAL FIBRILLATION?:[phone]}        Physical Exam:    VS:  There were no vitals taken for this visit.    Wt Readings from Last 3 Encounters:  01/06/24 (!) 340 lb (154.2 kg)   12/15/23 (!) 341 lb 11.4 oz (155 kg)  12/08/23 (!) 342 lb 9.5 oz (155.4 kg)     GEN: Well nourished, well developed in no acute distress NECK: No JVD CARDIAC: ***RRR, no murmurs, rubs, gallops RESPIRATORY:  Clear to auscultation without rales, wheezing or rhonchi  ABDOMEN: Soft, non-tender, non-distended EXTREMITIES:  No edema; No deformity.   ASSESSMENT:    1. S/P TAVR (transcatheter aortic valve replacement)   2. AF (paroxysmal atrial fibrillation) (HCC)   3. Chronic diastolic CHF (congestive heart failure) (HCC)   4. LBBB (left bundle branch block)   5. OSA (obstructive sleep apnea)   6. Obesity, Class III, BMI 40-49.9 (morbid obesity)   7. History of CVA with residual deficit     PLAN:    In order of problems listed above:  Severe AS s/p TAVR:  -- Echo today shows EF ***, normally functioning TAVR with a mean gradient of *** mm hg and *** PVL.  -- NYHA class *** symptoms.  -- Continue on Eliquis  5mg  BID. -- SBE prophylaxis discussed; I have RX'd amoxicillin .  -- Continue regular follow up with Dr. Mitzie Anda   PAF:  -- Sounds regular on exam today.  -- Continue Eliquis    Chronic diastolic CHF:  -- Followed by advanced CHF team -- Continue Farixga 10mg  daily, spiro 12.5mg  daily, torsemide  120mg  BID and KDur 40meq BID.   New LBBB s/p TAVR: -- Zio AT with no high grade heart block.  OSA/OHS:  -- Using Bipap nightly. Continue Bipap.   Morbid obesity:  -- There is no height or weight on file to calculate BMI. -- Continue tirzepatide , wt is trending down   Hx of CVA: -- Continue Eliquis . -- Continue statin.    Medication Adjustments/Labs and Tests Ordered: Current medicines are reviewed at length with the patient today.  Concerns regarding medicines are outlined above.  No orders of the defined types were placed in this encounter.  No orders of the defined types were placed in this encounter.   There are no Patient Instructions on file for this visit.    Signed, Abagail Hoar, PA-C  01/18/2024 9:53 AM    [facility]

## 2024-01-19 DIAGNOSIS — G8929 Other chronic pain: Secondary | ICD-10-CM | POA: Diagnosis not present

## 2024-01-19 DIAGNOSIS — M6281 Muscle weakness (generalized): Secondary | ICD-10-CM | POA: Diagnosis not present

## 2024-01-19 DIAGNOSIS — R279 Unspecified lack of coordination: Secondary | ICD-10-CM | POA: Diagnosis not present

## 2024-01-22 DIAGNOSIS — R279 Unspecified lack of coordination: Secondary | ICD-10-CM | POA: Diagnosis not present

## 2024-01-22 DIAGNOSIS — M6281 Muscle weakness (generalized): Secondary | ICD-10-CM | POA: Diagnosis not present

## 2024-01-23 DIAGNOSIS — I5032 Chronic diastolic (congestive) heart failure: Secondary | ICD-10-CM | POA: Diagnosis not present

## 2024-01-23 DIAGNOSIS — R279 Unspecified lack of coordination: Secondary | ICD-10-CM | POA: Diagnosis not present

## 2024-01-23 DIAGNOSIS — G2581 Restless legs syndrome: Secondary | ICD-10-CM | POA: Diagnosis not present

## 2024-01-23 DIAGNOSIS — E1140 Type 2 diabetes mellitus with diabetic neuropathy, unspecified: Secondary | ICD-10-CM | POA: Diagnosis not present

## 2024-01-23 DIAGNOSIS — G8929 Other chronic pain: Secondary | ICD-10-CM | POA: Diagnosis not present

## 2024-01-23 DIAGNOSIS — M6281 Muscle weakness (generalized): Secondary | ICD-10-CM | POA: Diagnosis not present

## 2024-01-24 DIAGNOSIS — R279 Unspecified lack of coordination: Secondary | ICD-10-CM | POA: Diagnosis not present

## 2024-01-24 DIAGNOSIS — M6281 Muscle weakness (generalized): Secondary | ICD-10-CM | POA: Diagnosis not present

## 2024-01-25 DIAGNOSIS — M6281 Muscle weakness (generalized): Secondary | ICD-10-CM | POA: Diagnosis not present

## 2024-01-25 DIAGNOSIS — R279 Unspecified lack of coordination: Secondary | ICD-10-CM | POA: Diagnosis not present

## 2024-01-26 DIAGNOSIS — M6281 Muscle weakness (generalized): Secondary | ICD-10-CM | POA: Diagnosis not present

## 2024-01-26 DIAGNOSIS — R279 Unspecified lack of coordination: Secondary | ICD-10-CM | POA: Diagnosis not present

## 2024-01-27 DIAGNOSIS — R279 Unspecified lack of coordination: Secondary | ICD-10-CM | POA: Diagnosis not present

## 2024-01-27 DIAGNOSIS — M6281 Muscle weakness (generalized): Secondary | ICD-10-CM | POA: Diagnosis not present

## 2024-01-28 DIAGNOSIS — R279 Unspecified lack of coordination: Secondary | ICD-10-CM | POA: Diagnosis not present

## 2024-01-28 DIAGNOSIS — M6281 Muscle weakness (generalized): Secondary | ICD-10-CM | POA: Diagnosis not present

## 2024-01-29 ENCOUNTER — Emergency Department (HOSPITAL_COMMUNITY)
Admission: EM | Admit: 2024-01-29 | Discharge: 2024-01-29 | Disposition: A | Discharge status: Skilled Nursing Facility | Attending: Emergency Medicine | Admitting: Emergency Medicine

## 2024-01-29 ENCOUNTER — Encounter (HOSPITAL_COMMUNITY): Payer: Self-pay

## 2024-01-29 ENCOUNTER — Other Ambulatory Visit: Payer: Self-pay

## 2024-01-29 DIAGNOSIS — I509 Heart failure, unspecified: Secondary | ICD-10-CM | POA: Insufficient documentation

## 2024-01-29 DIAGNOSIS — Z79899 Other long term (current) drug therapy: Secondary | ICD-10-CM | POA: Insufficient documentation

## 2024-01-29 DIAGNOSIS — N183 Chronic kidney disease, stage 3 unspecified: Secondary | ICD-10-CM | POA: Insufficient documentation

## 2024-01-29 DIAGNOSIS — E1122 Type 2 diabetes mellitus with diabetic chronic kidney disease: Secondary | ICD-10-CM | POA: Diagnosis not present

## 2024-01-29 DIAGNOSIS — R000 Tachycardia, unspecified: Secondary | ICD-10-CM | POA: Diagnosis not present

## 2024-01-29 DIAGNOSIS — R600 Localized edema: Secondary | ICD-10-CM | POA: Insufficient documentation

## 2024-01-29 DIAGNOSIS — Z7401 Bed confinement status: Secondary | ICD-10-CM | POA: Diagnosis not present

## 2024-01-29 DIAGNOSIS — N390 Urinary tract infection, site not specified: Secondary | ICD-10-CM | POA: Insufficient documentation

## 2024-01-29 DIAGNOSIS — Z8673 Personal history of transient ischemic attack (TIA), and cerebral infarction without residual deficits: Secondary | ICD-10-CM | POA: Insufficient documentation

## 2024-01-29 DIAGNOSIS — I130 Hypertensive heart and chronic kidney disease with heart failure and stage 1 through stage 4 chronic kidney disease, or unspecified chronic kidney disease: Secondary | ICD-10-CM | POA: Diagnosis not present

## 2024-01-29 DIAGNOSIS — R202 Paresthesia of skin: Secondary | ICD-10-CM | POA: Diagnosis not present

## 2024-01-29 DIAGNOSIS — I2510 Atherosclerotic heart disease of native coronary artery without angina pectoris: Secondary | ICD-10-CM | POA: Diagnosis not present

## 2024-01-29 DIAGNOSIS — R279 Unspecified lack of coordination: Secondary | ICD-10-CM | POA: Diagnosis not present

## 2024-01-29 DIAGNOSIS — Z794 Long term (current) use of insulin: Secondary | ICD-10-CM | POA: Diagnosis not present

## 2024-01-29 DIAGNOSIS — Z743 Need for continuous supervision: Secondary | ICD-10-CM | POA: Diagnosis not present

## 2024-01-29 DIAGNOSIS — R0902 Hypoxemia: Secondary | ICD-10-CM | POA: Diagnosis not present

## 2024-01-29 DIAGNOSIS — Z7901 Long term (current) use of anticoagulants: Secondary | ICD-10-CM | POA: Diagnosis not present

## 2024-01-29 DIAGNOSIS — M6281 Muscle weakness (generalized): Secondary | ICD-10-CM | POA: Diagnosis not present

## 2024-01-29 LAB — COMPREHENSIVE METABOLIC PANEL WITH GFR
ALT: 32 U/L (ref 0–44)
AST: 27 U/L (ref 15–41)
Albumin: 3.5 g/dL (ref 3.5–5.0)
Alkaline Phosphatase: 49 U/L (ref 38–126)
Anion gap: 13 (ref 5–15)
BUN: 16 mg/dL (ref 8–23)
CO2: 27 mmol/L (ref 22–32)
Calcium: 9.3 mg/dL (ref 8.9–10.3)
Chloride: 96 mmol/L — ABNORMAL LOW (ref 98–111)
Creatinine, Ser: 1.16 mg/dL (ref 0.61–1.24)
GFR, Estimated: >60 mL/min (ref >60)
Glucose, Bld: 200 mg/dL — ABNORMAL HIGH (ref 70–99)
Potassium: 3.8 mmol/L (ref 3.5–5.1)
Sodium: 136 mmol/L (ref 135–145)
Total Bilirubin: 0.7 mg/dL (ref 0.0–1.2)
Total Protein: 6.2 g/dL — ABNORMAL LOW (ref 6.5–8.1)

## 2024-01-29 LAB — URINALYSIS, COMPLETE (UACMP) WITH MICROSCOPIC
Bilirubin Urine: NEGATIVE
Glucose, UA: 150 mg/dL — AB
Hgb urine dipstick: NEGATIVE
Ketones, ur: NEGATIVE mg/dL
Nitrite: NEGATIVE
Protein, ur: NEGATIVE mg/dL
Specific Gravity, Urine: 1.008 (ref 1.005–1.030)
pH: 6 (ref 5.0–8.0)

## 2024-01-29 LAB — CBC WITH DIFFERENTIAL/PLATELET
Abs Immature Granulocytes: 0.05 10*3/uL (ref 0.00–0.07)
Basophils Absolute: 0.1 10*3/uL (ref 0.0–0.1)
Basophils Relative: 1 %
Eosinophils Absolute: 0.5 10*3/uL (ref 0.0–0.5)
Eosinophils Relative: 6 %
HCT: 42.2 % (ref 39.0–52.0)
Hemoglobin: 13.6 g/dL (ref 13.0–17.0)
Immature Granulocytes: 1 %
Lymphocytes Relative: 21 %
Lymphs Abs: 1.6 10*3/uL (ref 0.7–4.0)
MCH: 28.9 pg (ref 26.0–34.0)
MCHC: 32.2 g/dL (ref 30.0–36.0)
MCV: 89.8 fL (ref 80.0–100.0)
Monocytes Absolute: 0.7 10*3/uL (ref 0.1–1.0)
Monocytes Relative: 9 %
Neutro Abs: 5 10*3/uL (ref 1.7–7.7)
Neutrophils Relative %: 62 %
Platelets: 248 10*3/uL (ref 150–400)
RBC: 4.7 MIL/uL (ref 4.22–5.81)
RDW: 13 % (ref 11.5–15.5)
WBC: 7.9 10*3/uL (ref 4.0–10.5)
nRBC: 0 % (ref 0.0–0.2)

## 2024-01-29 MED ORDER — CEPHALEXIN 500 MG PO CAPS: 500.0000 mg | ORAL_CAPSULE | Freq: Four times a day (QID) | ORAL | 0 refills | Status: DC

## 2024-01-29 MED ORDER — CEPHALEXIN 250 MG PO CAPS
500.0000 mg | ORAL_CAPSULE | Freq: Once | ORAL | Status: AC
  Administered 2024-01-29: 500 mg via ORAL
  Filled 2024-01-29: qty 2

## 2024-01-29 NOTE — ED Triage Notes (Signed)
 Patient here from [facility] for complaint of 1 day of bilateral arm and hand tingling. Patient diabetic and slow to respond to questions. Patient denies pain, staff concerned that electrolytes out of range. CBG 244 per EMS

## 2024-01-29 NOTE — Discharge Instructions (Addendum)
 Follow up with primary care provider for recheck.  Return if any problems.

## 2024-01-29 NOTE — ED Notes (Signed)
Ptar called no eta

## 2024-01-30 DIAGNOSIS — R279 Unspecified lack of coordination: Secondary | ICD-10-CM | POA: Diagnosis not present

## 2024-01-30 DIAGNOSIS — M6281 Muscle weakness (generalized): Secondary | ICD-10-CM | POA: Diagnosis not present

## 2024-01-30 NOTE — ED Provider Notes (Signed)
 [place] EMERGENCY DEPARTMENT AT [facility] Provider Note   CSN: [id number] Arrival date & time: 01/29/24  9067     Patient presents with: No chief complaint on file.   Lawrence [name] is a 68 y.o. male.   Patient complains of feeling like he has tingling in his upper arms.  Patient states overall all he just has not felt well today.  Patient denies any new area of weakness.  He denies any cough or congestion he has not had any fever.  Patient does not think he has any urinary symptoms.  Patient has a past medical history of a CVA.  Patient denies any cough or congestion he denies fever or chills.  I spoke with the care provider who works at the facility where he resides.  She states patient has a history of the same and that he also has muscle spasms when he has the tingling today he did not want to take the muscle relaxer.  She reports patient was upset earlier because his family had not visited him this weekend.  Patient has a past medical history of congestive heart failure coronary artery disease hypertension SVT atrial fibs previous CVA sleep apnea, type 2 diabetes left-sided hemiparesis post CVA, BPH stage III kidney disease, cognitive dysfunction.  The history is provided by the patient and the nursing home.       Prior to Admission medications   Medication Sig Start Date End Date Taking? Authorizing Provider  acetaminophen  (TYLENOL ) 650 MG CR tablet Take 650 mg by mouth every 8 (eight) hours as needed for pain.    [provider]  albuterol  (VENTOLIN  HFA) 108 (90 Base) MCG/ACT inhaler Inhale 2 puffs into the lungs every 6 (six) hours as needed for wheezing or shortness of breath. 04/17/23   Hunsucker, Donnice SAUNDERS, MD  allopurinol  (ZYLOPRIM ) 100 MG tablet Take 1 tablet (100 mg total) by mouth daily. 07/25/23 07/24/24  Elgergawy, Brayton RAMAN, MD  apixaban  (ELIQUIS ) 5 MG TABS tablet Take 1 tablet (5 mg total) by mouth 2 (two) times daily. 04/17/23   Rolan Ezra RAMAN, MD   ARIPiprazole  (ABILIFY ) 5 MG tablet TAKE 1 TABLET BY MOUTH EVERYDAY AT BEDTIME 10/03/23   Mozingo, Regina Nattalie, NP  atorvastatin  (LIPITOR ) 80 MG tablet Take 1 tablet (80 mg total) by mouth at bedtime. 12/20/23   Cherlyn Labella, MD  bisacodyl  (DULCOLAX) 5 MG EC tablet Take 10 mg by mouth daily as needed for moderate constipation.    [provider]  busPIRone  (BUSPAR ) 5 MG tablet Take 1 tablet (5 mg total) by mouth 3 (three) times daily. 12/20/23   Akula, Vijaya, MD  cephALEXin  (KEFLEX ) 500 MG capsule Take 1 capsule (500 mg total) by mouth 4 (four) times daily. 01/29/24   Chesnie Capell K, PA-C  colchicine  0.6 MG tablet Take 1 tablet (0.6 mg total) by mouth daily. 07/12/23   Love, Sharlet RAMAN, PA-C  cyanocobalamin  (VITAMIN B12) 1000 MCG tablet Take 1,000 mcg by mouth daily.    [provider]  fenofibrate  (TRICOR ) 145 MG tablet Take 1 tablet (145 mg total) by mouth daily. 04/17/23   Rolan Ezra RAMAN, MD  ferrous sulfate  325 (65 FE) MG tablet Take 325 mg by mouth daily.    [provider]  fluticasone  (FLONASE ) 50 MCG/ACT nasal spray Place 2 sprays into both nostrils daily.     [provider]  guaiFENesin -dextromethorphan (ROBITUSSIN DM) 100-10 MG/5ML syrup Take 5 mLs by mouth every 4 (four) hours as needed  for cough. 10/25/23   Rai, Ripudeep MARLA, MD  HUMALOG KWIKPEN 100 UNIT/ML KwikPen Inject 2-15 Units into the skin. 121-150 2 units 151-200 3 units  201-250 5 units  251-300 8 units  301-350 11 units  351-400 15 units 12/11/23   [provider]  HYDROcodone -acetaminophen  (NORCO/VICODIN) 5-325 MG tablet Take 1 tablet by mouth every 6 (six) hours as needed for severe pain (pain score 7-10). 12/08/23   Elgergawy, Brayton RAMAN, MD  INCRUSE ELLIPTA  62.5 MCG/ACT AEPB Inhale 1 puff into the lungs daily. 12/11/23   [provider]  insulin  aspart (NOVOLOG ) 100 UNIT/ML injection Inject 0-15 Units into the skin 3 (three) times daily with meals. Sliding scale  CBG 70 - 120:  0 units: CBG 121 - 150: 2 units; CBG 151 - 200: 3 units; CBG 201 - 250: 5 units; CBG 251 - 300: 8 units;CBG 301 - 350: 11 units; CBG 351 - 400: 15 units; CBG > 400 : 15 units and notify MD Patient not taking: Reported on 12/15/2023 10/25/23   Rai, Nydia MARLA, MD  Lacosamide  150 MG TABS Take 1 tablet (150 mg total) by mouth 2 (two) times daily. 10/10/23   Briana Elgin LABOR, MD  levETIRAcetam  (KEPPRA ) 500 MG tablet Take 1 tablet (500 mg total) by mouth 2 (two) times daily. 05/04/23   Gregg Lek, MD  loperamide  (IMODIUM ) 2 MG capsule Take 2 mg by mouth daily as needed for diarrhea or loose stools.    [provider]  methocarbamol  (ROBAXIN ) 500 MG tablet Take 500 mg by mouth every 8 (eight) hours as needed for muscle spasms.    [provider]  metolazone  (ZAROXOLYN ) 2.5 MG tablet Take 2.5 mg by mouth once a week. Every Tuesday. 09/20/23   [provider]  midodrine  (PROAMATINE ) 5 MG tablet Take 1 tablet (5 mg total) by mouth 3 (three) times daily with meals. 12/08/23   Elgergawy, Brayton RAMAN, MD  omega-3 acid ethyl esters (LOVAZA ) 1 g capsule Take 2 g by mouth 2 (two) times daily.    [provider]  omeprazole (PRILOSEC) 20 MG capsule Take 20 mg by mouth daily. 05/29/20   [provider]  polyethylene glycol powder (GLYCOLAX /MIRALAX ) 17 GM/SCOOP powder Mix in water as directed and take 1 capful (17 g) by mouth daily. 07/12/23   Love, Sharlet RAMAN, PA-C  potassium chloride  SA (KLOR-CON  M20) 20 MEQ tablet Take 1-2 tablets (20-40 mEq total) by mouth See admin instructions. Take 2 tablets in the morning and 1 tablet in the evening Patient taking differently: Take 20-40 mEq by mouth See admin instructions. Take 40 meq in the morning and 20 meq in the evening. 10/10/23   Briana Elgin LABOR, MD  prazosin  (MINIPRESS ) 2 MG capsule Take 2 mg by mouth at bedtime. 08/17/23   [provider]  pregabalin  (LYRICA ) 150 MG capsule Take 1 capsule (150 mg total) by mouth 3 (three) times  daily. 10/10/23   Briana Elgin LABOR, MD  rOPINIRole  (REQUIP ) 0.25 MG tablet Take 1 tablet (0.25 mg total) by mouth 2 (two) times daily. 07/25/23   Elgergawy, Brayton RAMAN, MD  sennosides-docusate sodium  (SENOKOT-S) 8.6-50 MG tablet Take 2 tablets by mouth in the morning and at bedtime.    [provider]  spironolactone  (ALDACTONE ) 25 MG tablet Take 12.5 mg by mouth daily. 09/01/23   [provider]  tirzepatide  (MOUNJARO ) 15 MG/0.5ML Pen INJECT 15 MG INTO THE SKIN ONCE A WEEK. Patient taking differently: Inject 15 mg into the  skin once a week. Mondays 06/07/23   Dartha Ernst, MD  torsemide  (DEMADEX ) 20 MG tablet Take 1 tablet (20 mg total) by mouth 2 (two) times daily. 12/08/23   Elgergawy, Brayton RAMAN, MD  traZODone  (DESYREL ) 150 MG tablet Take 1 tablet (150 mg total) by mouth at bedtime. 12/20/23   Akula, Vijaya, MD  venlafaxine  XR (EFFEXOR -XR) 150 MG 24 hr capsule Take 150 mg by mouth daily with breakfast.    [provider]    Allergies: Niacin and related; Ppd [tuberculin purified protein derivative]; Tubersol [tuberculin, ppd]; Firvanq  [vancomycin ]; and Vibramycin  [doxycycline ]    Review of Systems  All other systems reviewed and are negative.   Updated Vital Signs BP 122/76   Pulse 88   Temp 98 F (36.7 C) (Oral)   Resp 18   SpO2 94%   Physical Exam Vitals and nursing note reviewed.  Constitutional:      Appearance: He is well-developed.  HENT:     Head: Normocephalic.   Cardiovascular:     Rate and Rhythm: Normal rate.  Pulmonary:     Effort: Pulmonary effort is normal.  Abdominal:     General: There is no distension.   Musculoskeletal:     Cervical back: Normal range of motion.     Right lower leg: Edema present.     Left lower leg: Edema present.     Comments: Limited use of left arm he is able to lift and move at elbow but not the same as the right   Skin:    General: Skin is warm.   Neurological:     General: No focal deficit present.      Mental Status: He is alert and oriented to person, place, and time.     (all labs ordered are listed, but only abnormal results are displayed) Labs Reviewed  COMPREHENSIVE METABOLIC PANEL WITH GFR - Abnormal; Notable for the following components:      Result Value   Chloride 96 (*)    Glucose, Bld 200 (*)    Total Protein 6.2 (*)    All other components within normal limits  URINALYSIS, COMPLETE (UACMP) WITH MICROSCOPIC - Abnormal; Notable for the following components:   APPearance HAZY (*)    Glucose, UA 150 (*)    Leukocytes,Ua MODERATE (*)    Bacteria, UA MANY (*)    All other components within normal limits  URINE CULTURE  CBC WITH DIFFERENTIAL/PLATELET  I-STAT CG4 LACTIC ACID, ED    EKG: EKG Interpretation Date/Time:  Monday January 29 2024 10:27:06 EDT Ventricular Rate:  81 PR Interval:  290 QRS Duration:  112 QT Interval:  400 QTC Calculation: 464 R Axis:   34  Text Interpretation: Sinus rhythm with 1st degree A-V block Low voltage QRS Inferior infarct , age undetermined Cannot rule out Anterior infarct , age undetermined Abnormal ECG When compared with ECG of 28-Dec-2023 12:17, PREVIOUS ECG IS PRESENT PR interval slightly longer Confirmed by Lenor Hollering [phone]) on 01/29/2024 10:33:50 AM  Radiology: No results found.   Procedures   Medications Ordered in the ED  cephALEXin  (KEFLEX ) capsule 500 mg (500 mg Oral Given 01/29/24 1313)                                    Medical Decision Making Patient complains of tingling in both of his arms  Amount and/or Complexity of Data Reviewed Independent Historian: caregiver and  EMS    Details: History by patient EMS and care provider at the facility where he resides External Data Reviewed: notes.    Details: Previous notes reviewed Labs: ordered. Decision-making details documented in ED Course.    Details: Labs ordered reviewed and interpreted.  Glucose is 200 ECG/medicine tests: ordered and independent  interpretation performed. Decision-making details documented in ED Course.    Details: EKG normal sinus no acute findings.  UA shows moderate leukocytes many bacteria  Risk Prescription drug management. Risk Details: Patient may have a urinary tract infection.  I reviewed his previous results and he had a Klebsiella infection in May.  Patient is given a dose of Keflex  here and a prescription for Keflex .  Urine culture is pending.  Patient is discharged in stable condition.        Final diagnoses:  Urinary tract infection without hematuria, site unspecified    ED Discharge Orders          Ordered    cephALEXin  (KEFLEX ) 500 MG capsule  4 times daily,   Status:  Discontinued        01/29/24 1305    cephALEXin  (KEFLEX ) 500 MG capsule  4 times daily        01/29/24 1305           An After Visit Summary was printed and given to the patient.     Flint Sonny POUR, [place] 01/30/24 [phone]    Lenor Hollering, MD 01/30/24 [phone]

## 2024-01-31 DIAGNOSIS — M6281 Muscle weakness (generalized): Secondary | ICD-10-CM | POA: Diagnosis not present

## 2024-01-31 DIAGNOSIS — R279 Unspecified lack of coordination: Secondary | ICD-10-CM | POA: Diagnosis not present

## 2024-02-01 DIAGNOSIS — R279 Unspecified lack of coordination: Secondary | ICD-10-CM | POA: Diagnosis not present

## 2024-02-01 DIAGNOSIS — M6281 Muscle weakness (generalized): Secondary | ICD-10-CM | POA: Diagnosis not present

## 2024-02-01 LAB — URINE CULTURE
Culture: >=100000 — AB
Special Requests: NORMAL

## 2024-02-02 ENCOUNTER — Telehealth (HOSPITAL_BASED_OUTPATIENT_CLINIC_OR_DEPARTMENT_OTHER): Payer: Self-pay | Admitting: *Deleted

## 2024-02-02 DIAGNOSIS — R279 Unspecified lack of coordination: Secondary | ICD-10-CM | POA: Diagnosis not present

## 2024-02-02 DIAGNOSIS — M6281 Muscle weakness (generalized): Secondary | ICD-10-CM | POA: Diagnosis not present

## 2024-02-02 NOTE — Telephone Encounter (Signed)
 Post ED Visit - Positive Culture Follow-up  Culture report reviewed by antimicrobial stewardship pharmacist: [facility] [x]  Vernell Meier, Pharm.D. []  Venetia Gully, Pharm.D., BCPS AQ-ID []  Garrel Crews, Pharm.D., BCPS []  Almarie Lunger, Pharm.D., BCPS []  [place], [address].D., BCPS, AAHIVP []  Rosaline Bihari, Pharm.D., BCPS, AAHIVP []  Vernell Meier, PharmD, BCPS []  Latanya Hint, PharmD, BCPS []  Donald Medley, PharmD, BCPS []  Rocky Bold, PharmD []  Dorothyann Alert, PharmD, BCPS []  Morene Babe, PharmD  [facility] []  Rosaline Edison, PharmD []  Romona Bliss, PharmD []  Dolphus Roller, PharmD []  Veva Seip, Rph []  Vernell Daunt) Leonce, PharmD []  Eva Allis, PharmD []  Rosaline Millet, PharmD []  Iantha Batch, PharmD []  Arvin Gauss, PharmD []  Wanda Hasting, PharmD []  Ronal Rav, PharmD []  Rocky Slade, PharmD []  Bard Jeans, PharmD   Positive urine culture Treated with Cephalexin , organism sensitive to the same and no further patient follow-up is required at this time.  Lawrence [name] 02/02/2024, 9:46 AM

## 2024-02-03 DIAGNOSIS — M6281 Muscle weakness (generalized): Secondary | ICD-10-CM | POA: Diagnosis not present

## 2024-02-03 DIAGNOSIS — R279 Unspecified lack of coordination: Secondary | ICD-10-CM | POA: Diagnosis not present

## 2024-02-04 DIAGNOSIS — M6281 Muscle weakness (generalized): Secondary | ICD-10-CM | POA: Diagnosis not present

## 2024-02-04 DIAGNOSIS — R279 Unspecified lack of coordination: Secondary | ICD-10-CM | POA: Diagnosis not present

## 2024-02-05 DIAGNOSIS — R279 Unspecified lack of coordination: Secondary | ICD-10-CM | POA: Diagnosis not present

## 2024-02-05 DIAGNOSIS — M6281 Muscle weakness (generalized): Secondary | ICD-10-CM | POA: Diagnosis not present

## 2024-02-06 DIAGNOSIS — K5900 Constipation, unspecified: Secondary | ICD-10-CM | POA: Diagnosis not present

## 2024-02-06 DIAGNOSIS — R279 Unspecified lack of coordination: Secondary | ICD-10-CM | POA: Diagnosis not present

## 2024-02-06 DIAGNOSIS — G8929 Other chronic pain: Secondary | ICD-10-CM | POA: Diagnosis not present

## 2024-02-06 DIAGNOSIS — M6281 Muscle weakness (generalized): Secondary | ICD-10-CM | POA: Diagnosis not present

## 2024-02-06 DIAGNOSIS — K219 Gastro-esophageal reflux disease without esophagitis: Secondary | ICD-10-CM | POA: Diagnosis not present

## 2024-02-06 DIAGNOSIS — R296 Repeated falls: Secondary | ICD-10-CM | POA: Diagnosis not present

## 2024-02-07 DIAGNOSIS — M6281 Muscle weakness (generalized): Secondary | ICD-10-CM | POA: Diagnosis not present

## 2024-02-07 DIAGNOSIS — R279 Unspecified lack of coordination: Secondary | ICD-10-CM | POA: Diagnosis not present

## 2024-02-08 DIAGNOSIS — M6281 Muscle weakness (generalized): Secondary | ICD-10-CM | POA: Diagnosis not present

## 2024-02-08 DIAGNOSIS — R279 Unspecified lack of coordination: Secondary | ICD-10-CM | POA: Diagnosis not present

## 2024-02-09 DIAGNOSIS — M6281 Muscle weakness (generalized): Secondary | ICD-10-CM | POA: Diagnosis not present

## 2024-02-09 DIAGNOSIS — R279 Unspecified lack of coordination: Secondary | ICD-10-CM | POA: Diagnosis not present

## 2024-02-11 DIAGNOSIS — M6281 Muscle weakness (generalized): Secondary | ICD-10-CM | POA: Diagnosis not present

## 2024-02-11 DIAGNOSIS — R279 Unspecified lack of coordination: Secondary | ICD-10-CM | POA: Diagnosis not present

## 2024-02-12 DIAGNOSIS — I63542 Cerebral infarction due to unspecified occlusion or stenosis of left cerebellar artery: Secondary | ICD-10-CM | POA: Diagnosis not present

## 2024-02-12 DIAGNOSIS — I5030 Unspecified diastolic (congestive) heart failure: Secondary | ICD-10-CM | POA: Diagnosis not present

## 2024-02-12 DIAGNOSIS — I69811 Memory deficit following other cerebrovascular disease: Secondary | ICD-10-CM | POA: Diagnosis not present

## 2024-02-12 DIAGNOSIS — R262 Difficulty in walking, not elsewhere classified: Secondary | ICD-10-CM | POA: Diagnosis not present

## 2024-02-12 DIAGNOSIS — M6281 Muscle weakness (generalized): Secondary | ICD-10-CM | POA: Diagnosis not present

## 2024-02-13 DIAGNOSIS — M6281 Muscle weakness (generalized): Secondary | ICD-10-CM | POA: Diagnosis not present

## 2024-02-13 DIAGNOSIS — R279 Unspecified lack of coordination: Secondary | ICD-10-CM | POA: Diagnosis not present

## 2024-02-14 DIAGNOSIS — M6281 Muscle weakness (generalized): Secondary | ICD-10-CM | POA: Diagnosis not present

## 2024-02-14 DIAGNOSIS — R279 Unspecified lack of coordination: Secondary | ICD-10-CM | POA: Diagnosis not present

## 2024-02-15 DIAGNOSIS — M6281 Muscle weakness (generalized): Secondary | ICD-10-CM | POA: Diagnosis not present

## 2024-02-15 DIAGNOSIS — R279 Unspecified lack of coordination: Secondary | ICD-10-CM | POA: Diagnosis not present

## 2024-02-16 ENCOUNTER — Ambulatory Visit (INDEPENDENT_AMBULATORY_CARE_PROVIDER_SITE_OTHER): Admitting: Neurology

## 2024-02-16 ENCOUNTER — Encounter: Payer: Self-pay | Admitting: Neurology

## 2024-02-16 VITALS — BP 130/84 | Ht 74.0 in | Wt 345.0 lb

## 2024-02-16 DIAGNOSIS — G40909 Epilepsy, unspecified, not intractable, without status epilepticus: Secondary | ICD-10-CM

## 2024-02-16 DIAGNOSIS — N390 Urinary tract infection, site not specified: Secondary | ICD-10-CM | POA: Diagnosis not present

## 2024-02-16 DIAGNOSIS — E1142 Type 2 diabetes mellitus with diabetic polyneuropathy: Secondary | ICD-10-CM

## 2024-02-16 DIAGNOSIS — Z794 Long term (current) use of insulin: Secondary | ICD-10-CM | POA: Diagnosis not present

## 2024-02-16 DIAGNOSIS — G2581 Restless legs syndrome: Secondary | ICD-10-CM | POA: Diagnosis not present

## 2024-02-16 DIAGNOSIS — M6281 Muscle weakness (generalized): Secondary | ICD-10-CM | POA: Diagnosis not present

## 2024-02-16 DIAGNOSIS — G4733 Obstructive sleep apnea (adult) (pediatric): Secondary | ICD-10-CM

## 2024-02-16 DIAGNOSIS — E538 Deficiency of other specified B group vitamins: Secondary | ICD-10-CM

## 2024-02-16 DIAGNOSIS — Z6841 Body Mass Index (BMI) 40.0 and over, adult: Secondary | ICD-10-CM

## 2024-02-16 DIAGNOSIS — R279 Unspecified lack of coordination: Secondary | ICD-10-CM | POA: Diagnosis not present

## 2024-02-16 NOTE — Progress Notes (Unsigned)
 [facility]  PATIENT: Lawrence [name] DOB: [birth year]  REFERRING CLINICIAN: Jerri Pfeiffer, MD HISTORY FROM: Patient and sister Lawrence [name] FOR VISIT: [facility] follow up   HISTORICAL  CHIEF COMPLAINT:  Chief Complaint  Patient presents with   New Patient (Initial Visit)    Rm 14, NP, hospital follow up    INTERVAL HISTORY 02/16/2024 Ray presents today for follow-up, he is alone today.  Last visit was in September 2024.  He was recently admitted to the hospital in May, presented with left-sided weakness and lethargy.  During the hospitalization, he was found to have UTI.  His CT was negative for any acute stroke.  Patient tells me that he was treated with Rocephin , and had any additional urinary infection, the last presentation to the hospital was on June 23, he just finished another round of antibiotics.  He tells me that he is doing fine except that he now experiences muscle spasms in both arms and legs at the end of the day.  Sometimes he feels like his muscles are twisting and lasting about a minute or 2.  He denies any pain.  He does have a history of restless leg syndrome and is on ropinirole  0.25 mg twice daily.  Denies any fall, he does use his wheelchair.  Denies any recent seizures.   INTERVAL HISTORY 05/04/2023 Ray presents today for follow-up, he is accompanied by wife.  Last visit was in March and since then he has been doing well, no seizure or seizure-like activity.  He reports compliant with his medications, ran out of Lyrica  2 weeks ago, therefore his neuropathy pain is present.  He does have a spinal cord stimulator which is also helping with the pain.  He is doing well in terms of sleep and his OSA.  No other complaints, no other concerns.   INTERVAL HISTORY 10/11/2022:  [name] presents today for follow-up, he is accompanied by wife.  Last visit was in November and since then he has been doing well, denies any seizure or seizure-like severity.   He is compliant with the medication, Keppra  Vimpat . He ins also on  Lyrica .  He reports currently taking Lyrica  twice daily instead of 3 times daily.  He did have his Spinal stimulator with good control of his pain. Denies any side effect from the medication.  Reports that his sleep is OK, usually gets about 6 hrs with his CPAP machine. He does sleep during the day while watching TV but does not take scheduled naps.    INTERVAL HISTORY 07/07/22:  Ray presents today for follow-up, he is accompanied by his sister Lawrence.  In term of the seizures, he denies any seizures but stated that lately has been having episodes of spasms, his arm will jerk uncontrollably and this can last up to an hour.  He is compliant with his Keppra  and Vimpat .  He reports neuropathy pain is getting better after the spinal stimulator.  He is still on Lyrica  150 mg 3 times daily.  He continues with home PT 3 days a week and also once a week he goes for [employer]  sneakers classes.    He does report some issues with short-term memory, he is following with his doctors including psychiatry for depression.  Last month he was admitted to the hospital for right-sided weakness and slurred speech and his MRI was negative for any acute stroke.  His symptoms continued to improve and he was discharged home.   Denies any additional episodes  of slurred speech, word finding difficulty or focal weakness. He reports last night, his leg gave out (left leg) and he fell, denies any major injury.    INTERVAL HISTORY 03/24/2022 Patient presents today with partner Clarita. Since last visit in February, he has been doing well in term of seizures, on Keppra  500 mg BID until August 2nd when he had a breakthrough seizure. He was seen in the ED and Vimpat  150 mg BID. He reports increase sleepiness while on Vimpat . He reports another breakthough seizure last night but states that yesterday was a very busy and stressful day. Interval for sleep apnea, patient was  seen and diagnosed with sleep apnea on CPAP but stated she had trouble using the CPAP machine at night and therefore has not been using it for quite some time. He also have a spinal cord stimulator for his peripheral neuropathy, reports that it is working very well for him.   INTERVAL HISTORY 09/10/2021:  Patient presents today for follow-up with Darin Birmingham, last visit was in September at that time he did stop his Keppra  and did not have any jerking motion of the right arm therefore plan was to observe patient off Keppra .  Since then he has been doing fine until December 31 when he presented to the hospital due to altered mental status.  He was admitted for Toxic metabolic encephalopathy, severe sepsis in the setting of pneumonia and acute hypoxic respiratory failure.  During this time he was noted to have right arm jerking and period of unresponsiveness.  He had a routine EEG which did not show any epileptiform discharge and no seizures.  Plan was to restart the patient on Keppra  but 1000 mg twice daily.  Since discharge from the hospital patient has been reported increased daytime somnolence.  He reported he is very difficult for him to for him to stay up, he felt like a zombie and also have issue with his memory.  He still dealing with peripheral neuropathy and is on Lyrica  150 mg 3 times daily and also has severe sleep apnea.  No seizure-like activity since being home.    HISTORY OF PRESENT ILLNESS:  This is a 68 year old man with past medical history of obesity, obstructive sleep apnea, diastolic heart failure, hypertension, hyperlipidemia, diabetes who is presenting with seizure-like activity.  Patient stated that he was admitted in March of this year for pulmonary edema, acute hypoxic respiratory failure, and left lower extremity cellulitis.  During his admission he was noted to have seizure-like activity.  Wife who is present today mentioned that patient started having tremor like activity in the  upper extremity lasted about 30 seconds.  He only had 1 episode.  He was evaluated for seizure-like activity.  He had MRI which was negative and EEG also which was negative.  He was started on Keppra  1000 mg twice daily.  Patient stated after discharge from the hospital he took the medication for 1 month and self discontinued.  Since leaving the hospital he has not had any seizure-like activity.  Denies any previous seizure.  Denies any family history of seizures, denies any seizures factor.  His MRI was negative for any acute stroke or chronic stroke .   Patient also report continued lower extremity pain and numbness.  He has a history of diabetic neuropathy and he is on pregabalin  100 mg 3 times daily.  His hemoglobin A1c is still elevated, last hemoglobin A1c was 8.  He is on both insulin  and oral meds for  his diabetes. Other issues discussed today he is asleep.  He had he does have a diagnosis of obstructive sleep apnea he is on a CPAP machine, reported he take he uses CPAP machine but still has been getting restful sleep..  The patient reported last time he has a steep CPAP machine checked was about 3 years ago.  He has not seen a sleep doctor since   Handedness: Right handed   Seizure Type: Unclear,   Current frequency: Only once   Any injuries from seizures:   Seizure risk factors: No seizure risk factor  Previous ASMs: Levetiracetam , Lacosamide    Currenty ASMs: Levetiracetam , Lacosamide    ASMs side effects: None   Brain Images: Normal MRI   Previous EEGs: Normal EEG   OTHER MEDICAL CONDITIONS: Obesity, OSA, HTN, DM, HLD, COPD, Diabetic neuropathy, Diastolic heart failure   REVIEW OF SYSTEMS: Full 14 system review of systems performed and negative with exception of: as noted in the HPI  ALLERGIES: Allergies  Allergen Reactions   Niacin And Related Other (See Comments)    Redness, flushing   Ppd [Tuberculin Purified Protein Derivative]     Unknown rxn (MAR)   Tubersol  [Tuberculin, Ppd] Other (See Comments)    Unknown rxn (MAR)   Firvanq  [Vancomycin ] Other (See Comments)    Red man syndrome   Vibramycin  [Doxycycline ] Rash    HOME MEDICATIONS: Outpatient Medications Prior to Visit  Medication Sig Dispense Refill   acetaminophen  (TYLENOL ) 650 MG CR tablet Take 650 mg by mouth every 8 (eight) hours as needed for pain.     albuterol  (VENTOLIN  HFA) 108 (90 Base) MCG/ACT inhaler Inhale 2 puffs into the lungs every 6 (six) hours as needed for wheezing or shortness of breath. 1 each 6   allopurinol  (ZYLOPRIM ) 100 MG tablet Take 1 tablet (100 mg total) by mouth daily.     apixaban  (ELIQUIS ) 5 MG TABS tablet Take 1 tablet (5 mg total) by mouth 2 (two) times daily. 180 tablet 3   ARIPiprazole  (ABILIFY ) 5 MG tablet TAKE 1 TABLET BY MOUTH EVERYDAY AT BEDTIME 60 tablet 2   atorvastatin  (LIPITOR ) 80 MG tablet Take 1 tablet (80 mg total) by mouth at bedtime. 30 tablet 3   busPIRone  (BUSPAR ) 5 MG tablet Take 1 tablet (5 mg total) by mouth 3 (three) times daily. 90 tablet 1   colchicine  0.6 MG tablet Take 1 tablet (0.6 mg total) by mouth daily. 30 tablet 0   cyanocobalamin  (VITAMIN B12) 1000 MCG tablet Take 1,000 mcg by mouth daily.     fenofibrate  (TRICOR ) 145 MG tablet Take 1 tablet (145 mg total) by mouth daily. 90 tablet 3   ferrous sulfate  325 (65 FE) MG tablet Take 325 mg by mouth daily.     fluticasone  (FLONASE ) 50 MCG/ACT nasal spray Place 2 sprays into both nostrils daily.      guaiFENesin -dextromethorphan (ROBITUSSIN DM) 100-10 MG/5ML syrup Take 5 mLs by mouth every 4 (four) hours as needed for cough.     HUMALOG KWIKPEN 100 UNIT/ML KwikPen Inject 2-15 Units into the skin. 121-150 2 units 151-200 3 units  201-250 5 units  251-300 8 units  301-350 11 units  351-400 15 units     INCRUSE ELLIPTA  62.5 MCG/ACT AEPB Inhale 1 puff into the lungs daily.     Lacosamide  150 MG TABS Take 1 tablet (150 mg total) by mouth 2 (two) times daily. 10 tablet 0   levETIRAcetam   (KEPPRA ) 500 MG tablet Take 1 tablet (500 mg  total) by mouth 2 (two) times daily. 180 tablet 3   loperamide  (IMODIUM ) 2 MG capsule Take 2 mg by mouth daily as needed for diarrhea or loose stools.     methocarbamol  (ROBAXIN ) 500 MG tablet Take 500 mg by mouth every 8 (eight) hours as needed for muscle spasms.     metolazone  (ZAROXOLYN ) 2.5 MG tablet Take 2.5 mg by mouth once a week. Every Tuesday.     midodrine  (PROAMATINE ) 5 MG tablet Take 1 tablet (5 mg total) by mouth 3 (three) times daily with meals.     omega-3 acid ethyl esters (LOVAZA ) 1 g capsule Take 2 g by mouth 2 (two) times daily.     omeprazole (PRILOSEC) 20 MG capsule Take 20 mg by mouth daily.     potassium chloride  SA (KLOR-CON  M20) 20 MEQ tablet Take 1-2 tablets (20-40 mEq total) by mouth See admin instructions. Take 2 tablets in the morning and 1 tablet in the evening (Patient taking differently: Take 20-40 mEq by mouth See admin instructions. Take 40 meq in the morning and 20 meq in the evening.)     prazosin  (MINIPRESS ) 2 MG capsule Take 2 mg by mouth at bedtime.     pregabalin  (LYRICA ) 150 MG capsule Take 1 capsule (150 mg total) by mouth 3 (three) times daily. 15 capsule 0   rOPINIRole  (REQUIP ) 0.25 MG tablet Take 1 tablet (0.25 mg total) by mouth 2 (two) times daily.     spironolactone  (ALDACTONE ) 25 MG tablet Take 12.5 mg by mouth daily.     tirzepatide  (MOUNJARO ) 15 MG/0.5ML Pen INJECT 15 MG INTO THE SKIN ONCE A WEEK. (Patient taking differently: Inject 15 mg into the skin once a week. Mondays) 6 mL 0   torsemide  (DEMADEX ) 20 MG tablet Take 1 tablet (20 mg total) by mouth 2 (two) times daily.     traZODone  (DESYREL ) 150 MG tablet Take 1 tablet (150 mg total) by mouth at bedtime. 30 tablet 0   venlafaxine  XR (EFFEXOR -XR) 150 MG 24 hr capsule Take 150 mg by mouth daily with breakfast.     bisacodyl  (DULCOLAX) 5 MG EC tablet Take 10 mg by mouth daily as needed for moderate constipation.     cephALEXin  (KEFLEX ) 500 MG capsule  Take 1 capsule (500 mg total) by mouth 4 (four) times daily. (Patient not taking: Reported on 02/16/2024) 28 capsule 0   HYDROcodone -acetaminophen  (NORCO/VICODIN) 5-325 MG tablet Take 1 tablet by mouth every 6 (six) hours as needed for severe pain (pain score 7-10). (Patient not taking: Reported on 02/16/2024) 10 tablet 0   insulin  aspart (NOVOLOG ) 100 UNIT/ML injection Inject 0-15 Units into the skin 3 (three) times daily with meals. Sliding scale  CBG 70 - 120: 0 units: CBG 121 - 150: 2 units; CBG 151 - 200: 3 units; CBG 201 - 250: 5 units; CBG 251 - 300: 8 units;CBG 301 - 350: 11 units; CBG 351 - 400: 15 units; CBG > 400 : 15 units and notify MD (Patient not taking: Reported on 02/16/2024)     polyethylene glycol powder (GLYCOLAX /MIRALAX ) 17 GM/SCOOP powder Mix in water as directed and take 1 capful (17 g) by mouth daily. 238 g 0   sennosides-docusate sodium  (SENOKOT-S) 8.6-50 MG tablet Take 2 tablets by mouth in the morning and at bedtime.     No facility-administered medications prior to visit.    PAST MEDICAL HISTORY: Past Medical History:  Diagnosis Date   Acquired dilation of ascending aorta and aortic root (HCC)  40mm by echo 01/2021   Adenomatous colon polyp 2007   Anemia    Anxiety    Asthma    BPH without obstruction/lower urinary tract symptoms 02/22/2017   Chronic diastolic (congestive) heart failure (HCC)    Chronic venous stasis 03/07/2019   COPD (chronic obstructive pulmonary disease) (HCC)    Coronary artery calcification seen on CAT scan    Depression    Diabetic neuropathy (HCC) 09/11/2019   History of colon polyps 08/24/2018   Hypertension    Morbid obesity (HCC)    OSA (obstructive sleep apnea)    Pain due to onychomycosis of toenails of both feet 09/11/2019   Peripheral neuropathy 02/22/2017   Primary osteoarthritis, left shoulder 03/05/2017   PTSD (post-traumatic stress disorder)    Pure hypercholesterolemia    QT prolongation 03/07/2019   S/P TAVR  (transcatheter aortic valve replacement) 02/07/2023   34mm Evolut FX via TF approach with Dr. Wendel and Dr. Lucas   Seizures [facility])    Severe aortic stenosis    Sinus tachycardia 03/07/2019   Sleep apnea    CPAP   Type 2 diabetes mellitus with vascular disease (HCC) 09/11/2019    PAST SURGICAL HISTORY: Past Surgical History:  Procedure Laterality Date   ENDOVENOUS ABLATION SAPHENOUS VEIN W/ LASER Right 08/20/2020   endovenous laser ablation right greater saphenous vein by Medford Blade MD    ENDOVENOUS ABLATION SAPHENOUS VEIN W/ LASER Left 11/16/2022   endovenous laser ablation left greater saphenous vein by Medford Blade MD   INTRAOPERATIVE TRANSTHORACIC ECHOCARDIOGRAM N/A 02/07/2023   Procedure: INTRAOPERATIVE TRANSTHORACIC ECHOCARDIOGRAM;  Surgeon: Thukkani, Arun K, MD;  Location: [facility] INVASIVE CV LAB;  Service: Open Heart Surgery;  Laterality: N/A;   JOINT REPLACEMENT     left knee replacement x 2   KNEE ARTHROSCOPY Bilateral    LEFT HEART CATH AND CORONARY ANGIOGRAPHY N/A 01/17/2018   Procedure: LEFT HEART CATH AND CORONARY ANGIOGRAPHY;  Surgeon: Anner Alm ORN, MD;  Location: [facility] INVASIVE CV LAB;  Service: Cardiovascular;  Laterality: N/A;   PRESSURE SENSOR/CARDIOMEMS N/A 08/26/2021   Procedure: PRESSURE SENSOR/CARDIOMEMS;  Surgeon: Rolan Ezra RAMAN, MD;  Location: [facility] INVASIVE CV LAB;  Service: Cardiovascular;  Laterality: N/A;   RIGHT HEART CATH N/A 08/26/2021   Procedure: RIGHT HEART CATH;  Surgeon: Rolan Ezra RAMAN, MD;  Location: [facility] INVASIVE CV LAB;  Service: Cardiovascular;  Laterality: N/A;   RIGHT HEART CATH AND CORONARY ANGIOGRAPHY N/A 01/26/2023   Procedure: RIGHT HEART CATH AND CORONARY ANGIOGRAPHY;  Surgeon: Wendel Lurena POUR, MD;  Location: [facility] INVASIVE CV LAB;  Service: Cardiovascular;  Laterality: N/A;   TEE WITHOUT CARDIOVERSION N/A 08/26/2021   Procedure: TRANSESOPHAGEAL ECHOCARDIOGRAM (TEE);  Surgeon: Rolan Ezra RAMAN, MD;  Location: [facility] ENDOSCOPY;  Service:  Cardiovascular;  Laterality: N/A;   TOOTH EXTRACTION N/A 02/03/2023   Procedure: DENTAL RESTORATION/EXTRACTIONS;  Surgeon: Sheryle Hamilton, DMD;  Location: [facility] OR;  Service: Oral Surgery;  Laterality: N/A;   TRANSCATHETER AORTIC VALVE REPLACEMENT, TRANSFEMORAL Right 02/07/2023   Procedure: Transcatheter Aortic Valve Replacement, Transfemoral;  Surgeon: Wendel Lurena POUR, MD;  Location: [facility] INVASIVE CV LAB;  Service: Open Heart Surgery;  Laterality: Right;   TRANSESOPHAGEAL ECHOCARDIOGRAM (CATH LAB) N/A 12/07/2023   Procedure: TRANSESOPHAGEAL ECHOCARDIOGRAM;  Surgeon: Santo Stanly LABOR, MD;  Location: [facility] INVASIVE CV LAB;  Service: Cardiovascular;  Laterality: N/A;   UMBILICAL HERNIA REPAIR      FAMILY HISTORY: Family History  Problem Relation Age of Onset   CAD Maternal Grandfather    Diabetes Other  Diabetes Mellitus II Neg Hx    Colon cancer Neg Hx    Esophageal cancer Neg Hx    Inflammatory bowel disease Neg Hx    Liver disease Neg Hx    Pancreatic cancer Neg Hx    Rectal cancer Neg Hx    Stomach cancer Neg Hx    Sleep apnea Neg Hx     SOCIAL HISTORY: Social History   Socioeconomic History   Marital status: Significant Other    Spouse name: Clarita Powers   Number of children: 1   Years of education: Not on file   Highest education level: Associate degree: academic program  Occupational History   Occupation: retired    Comment: Building control surveyor  Tobacco Use   Smoking status: Former    Current packs/day: 0.00    Average packs/day: 1 pack/day for 44.0 years (44.0 ttl pk-yrs)    Types: Cigarettes    Start date: 03/07/1972    Quit date: 03/07/2016    Years since quitting: 7.9    Passive exposure: Past   Smokeless tobacco: Never  Vaping Use   Vaping status: Never Used  Substance and Sexual Activity   Alcohol  use: Yes    Comment: rare   Drug use: No   Sexual activity: Not Currently  Other Topics Concern   Not on file  Social History Narrative   Lives with [facility]ita [facility]   Right Handed   Drinks 1-2 cups caffeine   Social Drivers of Health   Financial Resource Strain: Medium Risk (12/06/2021)   Received from [facility]   Overall Financial Resource Strain (CARDIA)    Difficulty of Paying Living Expenses: Somewhat hard  Food Insecurity: No Food Insecurity (12/15/2023)   Hunger Vital Sign    Worried About Running Out of Food in the Last Year: Never true    Ran Out of Food in the Last Year: Never true  Transportation Needs: No Transportation Needs (12/15/2023)   PRAPARE - Administrator, Civil Service (Medical): No    Lack of Transportation (Non-Medical): No  Recent Concern: Transportation Needs - Unmet Transportation Needs (10/03/2023)   PRAPARE - Administrator, Civil Service (Medical): Yes    Lack of Transportation (Non-Medical): No  Physical Activity: Unknown (12/06/2021)   Received from [facility]   Exercise Vital Sign    On average, how many days per week do you engage in moderate to strenuous exercise (like a brisk walk)?: Patient declined    On average, how many minutes do you engage in exercise at this level?: 10 min  Stress: No Stress Concern Present (01/21/2022)   Received from [facility] - Occupational Stress Questionnaire    Feeling of Stress : Only a little  Recent Concern: Stress - Stress Concern Present (11/19/2021)   Received from [facility] of Occupational Health - Occupational Stress Questionnaire    Feeling of Stress : To some extent  Social Connections: Socially Isolated (12/15/2023)   Social Connection and Isolation Panel    Frequency of Communication with Friends and Family: More than three times a week    Frequency of Social Gatherings with Friends and Family: Once a week    Attends Religious Services: Never    Database administrator or Organizations: No    Attends Banker Meetings: Never     Marital Status: Divorced  Catering manager Violence:  Not At Risk (12/15/2023)   Humiliation, Afraid, Rape, and Kick questionnaire    Fear of Current or Ex-Partner: No    Emotionally Abused: No    Physically Abused: No    Sexually Abused: No     PHYSICAL EXAM  GENERAL EXAM/CONSTITUTIONAL: Vitals:  Vitals:   02/16/24 1039  BP: 130/84  Weight: (!) 345 lb (156.5 kg)  Height: 6' 2 (1.88 m)     Body mass index is 44.3 kg/m. Wt Readings from Last 3 Encounters:  02/16/24 (!) 345 lb (156.5 kg)  01/06/24 (!) 340 lb (154.2 kg)  12/15/23 (!) 341 lb 11.4 oz (155 kg)   Patient is in no distress; well developed, nourished and groomed; neck is supple. He is morbidly obese. Foul smell of urine   MUSCULOSKELETAL: Gait, strength, tone, movements noted in Neurologic exam below  NEUROLOGIC: MENTAL STATUS:  awake, alert, oriented to person, place and time recent and remote memory intact normal attention and concentration language fluent, comprehension intact, naming intact fund of knowledge appropriate  CRANIAL NERVE:  2nd, 3rd, 4th, 6th -visual fields full to confrontation, extraocular muscles intact, no nystagmus 5th - facial sensation symmetric 7th - facial strength symmetric 8th - hearing intact 9th - palate elevates symmetrically, uvula midline 11th - shoulder shrug symmetric 12th - tongue protrusion midline  MOTOR:  normal bulk and tone, at least antigravity in the BUE and BLE.  SENSORY:  Decrease sensation to light touch  COORDINATION:  finger-nose-finger, fine finger movements normal  GAIT/STATION:  Deferred    DIAGNOSTIC DATA (LABS, IMAGING, TESTING) - I reviewed patient records, labs, notes, testing and imaging myself where available.  Lab Results  Component Value Date   WBC 7.9 01/29/2024   HGB 13.6 01/29/2024   HCT 42.2 01/29/2024   MCV 89.8 01/29/2024   PLT 248 01/29/2024      Component Value Date/Time   NA 136 01/29/2024 1017   NA 136  02/15/2023 1415   K 3.8 01/29/2024 1017   CL 96 (L) 01/29/2024 1017   CO2 27 01/29/2024 1017   GLUCOSE 200 (H) 01/29/2024 1017   BUN 16 01/29/2024 1017   BUN 37 (H) 02/15/2023 1415   CREATININE 1.16 01/29/2024 1017   CALCIUM  9.3 01/29/2024 1017   PROT 6.2 (L) 01/29/2024 1017   ALBUMIN 3.5 01/29/2024 1017   AST 27 01/29/2024 1017   ALT 32 01/29/2024 1017   ALKPHOS 49 01/29/2024 1017   BILITOT 0.7 01/29/2024 1017   GFRNONAA >60 01/29/2024 1017   GFRAA 77 05/20/2020 1038   Lab Results  Component Value Date   CHOL 177 12/17/2023   HDL 34 (L) 12/17/2023   LDLCALC 107 (H) 12/17/2023   TRIG 179 (H) 12/17/2023   Lab Results  Component Value Date   HGBA1C 7.0 (H) 12/18/2023   Lab Results  Component Value Date   VITAMINB12 194 07/21/2023   Lab Results  Component Value Date   TSH 1.290 10/20/2023   MRI Brain 05/26/22 Normal brain MRI for age. No acute intracranial infarct or other abnormality.  MRI Brain 02/08/2021 No evidence of recent infarction, hemorrhage, or mass. No significant change since recent prior study.  CT Head 01/06/2024 1. No acute traumatic injury identified. Stable and negative for age noncontrast CT appearance of the brain. 2. Stable mild paranasal sinus inflammation.  EEG 01/26/2021: Normal routine EEG  EEG 04/27/2021: This study is within normal limits. No seizures or epileptiform discharges were seen throughout the recording. If suspicion for ictal-interictal activity remains  a concern, a prolonged study can  be considered.   EEG 08/09/2021: This study is within normal limits. No seizures or epileptiform discharges were seen throughout the recording.   I personally reviewed brain Images and previous EEG reports.   ASSESSMENT AND PLAN  68 y.o. year old male  with morbid obesity, hypertension, hyperlipidemia, diabetes mellitus type 2, diabetes neuropathy, diastolic heart failure, seizure disorder who is presenting for seizure follow up for recent  admission for left sided weakness and found to have a UTI, no stroke seen on images.  Since that admission in May, he presented again in June for another UTI, recently finished another round of antibiotics but on exam today, he still has foul smell of urine. No seizures since last visit, currently doing well on Vimpat  150 mg twice daily, Keppra  500 mg twice daily and Lyrica  150 mg three times daily. We will continue current medications, and advise him to complete another urine analysis to rule out infection. Continue to follow up with PCP and return in 6 to 8 months or sooner if worse.    1. Seizure disorder (HCC)   2. RLS (restless legs syndrome)   3. Diabetic peripheral neuropathy (HCC)   4. OSA on CPAP   5. Morbid obesity with BMI of 45.0-49.9, adult (HCC)   6. B12 deficiency   7. Urinary tract infection without hematuria, site unspecified      Patient Instructions  Increase ropinirole  to 0.5 mg twice daily Continue other medication Follow-up with your PCP to rule out another urinary tract infection Continue with weight loss, and increase physical activity Follow-up in 6 to 8 months or sooner if worse   Per [place]  DMV statutes, patients with seizures are not allowed to drive until they have been seizure-free for six months.  Other recommendations include using caution when using heavy equipment or power tools. Avoid working on ladders or at heights. Take showers instead of baths.  Do not swim alone.  Ensure the water temperature is not too high on the home water heater. Do not go swimming alone. Do not lock yourself in a room alone (i.e. bathroom). When caring for infants or small children, sit down when holding, feeding, or changing them to minimize risk of injury to the child in the event you have a seizure. Maintain good sleep hygiene. Avoid alcohol .  Also recommend adequate sleep, hydration, good diet and minimize stress.   During the Seizure  - First, ensure adequate  ventilation and place patients on the floor on their left side  Loosen clothing around the neck and ensure the airway is patent. If the patient is clenching the teeth, do not force the mouth open with any object as this can cause severe damage - Remove all items from the surrounding that can be hazardous. The patient may be oblivious to what's happening and may not even know what he or she is doing. If the patient is confused and wandering, either gently guide him/her away and block access to outside areas - Reassure the individual and be comforting - Call 911. In most cases, the seizure ends before EMS arrives. However, there are cases when seizures may last over 3 to 5 minutes. Or the individual may have developed breathing difficulties or severe injuries. If a pregnant patient or a person with diabetes develops a seizure, it is prudent to call an ambulance. - Finally, if the patient does not regain full consciousness, then call EMS. Most patients will remain confused for about  45 to 90 minutes after a seizure, so you must use judgment in calling for help. - Avoid restraints but make sure the patient is in a bed with padded side rails - Place the individual in a lateral position with the neck slightly flexed; this will help the saliva drain from the mouth and prevent the tongue from falling backward - Remove all nearby furniture and other hazards from the area - Provide verbal assurance as the individual is regaining consciousness - Provide the patient with privacy if possible - Call for help and start treatment as ordered by the caregiver   After the Seizure (Postictal Stage)  After a seizure, most patients experience confusion, fatigue, muscle pain and/or a headache. Thus, one should permit the individual to sleep. For the next few days, reassurance is essential. Being calm and helping reorient the person is also of importance.  Most seizures are painless and end spontaneously. Seizures are not  harmful to others but can lead to complications such as stress on the lungs, brain and the heart. Individuals with prior lung problems may develop labored breathing and respiratory distress.     No orders of the defined types were placed in this encounter.    No orders of the defined types were placed in this encounter.   Return in about 6 months (around 08/18/2024).    Pastor Falling, MD 02/17/2024, 3:04 PM  [facility] [address], [address] [place], [place] [address] [phone]

## 2024-02-17 ENCOUNTER — Encounter: Payer: Self-pay | Admitting: Neurology

## 2024-02-17 DIAGNOSIS — R279 Unspecified lack of coordination: Secondary | ICD-10-CM | POA: Diagnosis not present

## 2024-02-17 DIAGNOSIS — N390 Urinary tract infection, site not specified: Secondary | ICD-10-CM | POA: Diagnosis not present

## 2024-02-17 DIAGNOSIS — M6281 Muscle weakness (generalized): Secondary | ICD-10-CM | POA: Diagnosis not present

## 2024-02-17 NOTE — Patient Instructions (Addendum)
 Increase ropinirole  to 0.5 mg twice daily Continue other medication Follow-up with your PCP to rule out another urinary tract infection Continue with weight loss, and increase physical activity Follow-up in 6 to 8 months or sooner if worse

## 2024-02-18 DIAGNOSIS — M6281 Muscle weakness (generalized): Secondary | ICD-10-CM | POA: Diagnosis not present

## 2024-02-18 DIAGNOSIS — R279 Unspecified lack of coordination: Secondary | ICD-10-CM | POA: Diagnosis not present

## 2024-02-19 DIAGNOSIS — R279 Unspecified lack of coordination: Secondary | ICD-10-CM | POA: Diagnosis not present

## 2024-02-19 DIAGNOSIS — M6281 Muscle weakness (generalized): Secondary | ICD-10-CM | POA: Diagnosis not present

## 2024-02-20 DIAGNOSIS — M6281 Muscle weakness (generalized): Secondary | ICD-10-CM | POA: Diagnosis not present

## 2024-02-20 DIAGNOSIS — N390 Urinary tract infection, site not specified: Secondary | ICD-10-CM | POA: Diagnosis not present

## 2024-02-20 DIAGNOSIS — R279 Unspecified lack of coordination: Secondary | ICD-10-CM | POA: Diagnosis not present

## 2024-02-20 DIAGNOSIS — K5900 Constipation, unspecified: Secondary | ICD-10-CM | POA: Diagnosis not present

## 2024-02-20 DIAGNOSIS — K219 Gastro-esophageal reflux disease without esophagitis: Secondary | ICD-10-CM | POA: Diagnosis not present

## 2024-02-20 DIAGNOSIS — G8929 Other chronic pain: Secondary | ICD-10-CM | POA: Diagnosis not present

## 2024-02-21 DIAGNOSIS — I639 Cerebral infarction, unspecified: Secondary | ICD-10-CM | POA: Diagnosis not present

## 2024-02-21 DIAGNOSIS — M6281 Muscle weakness (generalized): Secondary | ICD-10-CM | POA: Diagnosis not present

## 2024-02-21 DIAGNOSIS — I5032 Chronic diastolic (congestive) heart failure: Secondary | ICD-10-CM | POA: Diagnosis not present

## 2024-02-21 DIAGNOSIS — E1140 Type 2 diabetes mellitus with diabetic neuropathy, unspecified: Secondary | ICD-10-CM | POA: Diagnosis not present

## 2024-02-21 DIAGNOSIS — G8929 Other chronic pain: Secondary | ICD-10-CM | POA: Diagnosis not present

## 2024-02-21 DIAGNOSIS — R279 Unspecified lack of coordination: Secondary | ICD-10-CM | POA: Diagnosis not present

## 2024-02-22 DIAGNOSIS — R279 Unspecified lack of coordination: Secondary | ICD-10-CM | POA: Diagnosis not present

## 2024-02-22 DIAGNOSIS — M6281 Muscle weakness (generalized): Secondary | ICD-10-CM | POA: Diagnosis not present

## 2024-02-23 DIAGNOSIS — R279 Unspecified lack of coordination: Secondary | ICD-10-CM | POA: Diagnosis not present

## 2024-02-23 DIAGNOSIS — M6281 Muscle weakness (generalized): Secondary | ICD-10-CM | POA: Diagnosis not present

## 2024-02-24 DIAGNOSIS — R279 Unspecified lack of coordination: Secondary | ICD-10-CM | POA: Diagnosis not present

## 2024-02-24 DIAGNOSIS — M6281 Muscle weakness (generalized): Secondary | ICD-10-CM | POA: Diagnosis not present

## 2024-02-25 DIAGNOSIS — R279 Unspecified lack of coordination: Secondary | ICD-10-CM | POA: Diagnosis not present

## 2024-02-25 DIAGNOSIS — M6281 Muscle weakness (generalized): Secondary | ICD-10-CM | POA: Diagnosis not present

## 2024-02-26 ENCOUNTER — Encounter: Payer: Self-pay | Admitting: Professional Counselor

## 2024-02-26 ENCOUNTER — Ambulatory Visit (INDEPENDENT_AMBULATORY_CARE_PROVIDER_SITE_OTHER): Admitting: Professional Counselor

## 2024-02-26 DIAGNOSIS — R1031 Right lower quadrant pain: Secondary | ICD-10-CM | POA: Diagnosis not present

## 2024-02-26 DIAGNOSIS — F331 Major depressive disorder, recurrent, moderate: Secondary | ICD-10-CM | POA: Diagnosis not present

## 2024-02-26 DIAGNOSIS — R279 Unspecified lack of coordination: Secondary | ICD-10-CM | POA: Diagnosis not present

## 2024-02-26 DIAGNOSIS — F411 Generalized anxiety disorder: Secondary | ICD-10-CM

## 2024-02-26 DIAGNOSIS — M6281 Muscle weakness (generalized): Secondary | ICD-10-CM | POA: Diagnosis not present

## 2024-02-26 NOTE — Progress Notes (Unsigned)
 [facility] Counselor Initial Adult Exam  Name: Lawrence [name] Date: 02/27/2024 MRN: [id number] DOB: July 22, 1956 PCP: Katina Pfeiffer, PA-C  Time spent: 11:11 AM to 12:15 PM  Guardian/Payee:  pt    Paperwork requested:  No   Reason for Visit /Presenting Problem: depression, anxiety  Mental Status Exam:    Appearance:   Casual     Behavior:  Appropriate, Sharing, and Motivated  Motor:  Pt in wheelchair, able to maneuver independently   Speech/Language:   Clear and Coherent and Normal Rate  Affect:  tearfulness  Mood:  Sadness, depression  Thought process:  normal  Thought content:    WNL  Sensory/Perceptual disturbances:    WNL  Orientation:  oriented to person, place, time/date, and situation  Attention:  Good  Concentration:  Good  Memory:  WNL  Fund of knowledge:   Good  Insight:    Good  Judgment:   Good  Impulse Control:  Good   Reported Symptoms: Anhedonia, low mood, sleep concerns, fatigue, appetite concerns, low self-esteem, trouble concentrating, restlessness, vague SI, nervousness, anxiousness, worries, trouble relaxing, irritability, catastrophic thinking, grief/loss, health concerns, health care concerns, panic attacks by history, trauma history, experience of manic symptoms by history  Risk Assessment: Danger to Self:  Yes.  without intent/plan Self-injurious Behavior: No Danger to Others: No Duty to Warn:no Physical Aggression / Violence:No  Access to Firearms a concern: No  Gang Involvement:No  Patient / guardian was educated about steps to take if suicide or homicide risk level increases between visits: yes While future psychiatric events cannot be accurately predicted, the patient does not currently require acute inpatient psychiatric care and does not currently meet [place]  involuntary commitment criteria.  Substance Abuse History: Current substance abuse: No     Past Psychiatric History:   Previous psychological history is significant for  anxiety, depression, and PTSD, panic attacks Outpatient Providers: two in adulthood, [facility] History of Psych Hospitalization: Yes in late 40's at time of divorce, suicide attempt Psychological Testing: prior testing for learning disability, dx of dyslexia    Abuse History: Victim of No., n/a   Report needed: No. Victim of Neglect:Yes.  Pt identifies experience of having been left in the hospital by family members by hx, and to be experiencing current challenges regarding care circumstances which he reports to be addressing with facility and ombudsman  Perpetrator of n/a  Witness / Exposure to Domestic Violence: No   Protective Services Involvement: No  Witness to [employer] Violence:  No   Family History: father died by suicide when patient was 4yo sister: depression Family History  Problem Relation Age of Onset   CAD Maternal Grandfather    Diabetes Other    Diabetes Mellitus II Neg Hx    Colon cancer Neg Hx    Esophageal cancer Neg Hx    Inflammatory bowel disease Neg Hx    Liver disease Neg Hx    Pancreatic cancer Neg Hx    Rectal cancer Neg Hx    Stomach cancer Neg Hx    Sleep apnea Neg Hx    Living situation: the patient lives in a skilled nursing facility  Sexual Orientation:  Straight  Relationship Status: partnered, living separately                If a parent, number of children / ages: 23yo dtr  Support Systems; significant other, two brothers   Surveyor, quantity Stress:  Yes   Income/Employment/Disability: Product manager and Doctor, hospital  Service: Yes [employer] History: Education: 11th grade completed, some senior year high school classes  Religion/Sprituality/World View:   Christian  Any cultural differences that may affect / interfere with treatment:  not applicable   Recreation/Hobbies: Bible studies, time with significant other and friends, facility activities, music, ministry  Stressors:Health  problems   Other: care circumstance challenges, mental health concerns     Strengths:  Supportive Relationships, Family, Friends, Spirituality, Hopefulness, Journalist, newspaper, Able to Communicate Effectively, and resiliency, helpful to others, friendliness  Barriers:  n/a   Legal History: Pending legal issue / charges: The patient has no significant history of legal issues. History of legal issue / charges: n/a  Medical History/Surgical History:reviewed Past Medical History:  Diagnosis Date   Acquired dilation of ascending aorta and aortic root (HCC)    40mm by echo 01/2021   Adenomatous colon polyp 2007   Anemia    Anxiety    Asthma    BPH without obstruction/lower urinary tract symptoms 02/22/2017   Chronic diastolic (congestive) heart failure (HCC)    Chronic venous stasis 03/07/2019   COPD (chronic obstructive pulmonary disease) (HCC)    Coronary artery calcification seen on CAT scan    Depression    Diabetic neuropathy (HCC) 09/11/2019   History of colon polyps 08/24/2018   Hypertension    Morbid obesity (HCC)    OSA (obstructive sleep apnea)    Pain due to onychomycosis of toenails of both feet 09/11/2019   Peripheral neuropathy 02/22/2017   Primary osteoarthritis, left shoulder 03/05/2017   PTSD (post-traumatic stress disorder)    Pure hypercholesterolemia    QT prolongation 03/07/2019   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 Wendel and Dr. Lucas   Seizures [facility])    Severe aortic stenosis    Sinus tachycardia 03/07/2019   Sleep apnea    CPAP   Type 2 diabetes mellitus with vascular disease (HCC) 09/11/2019    Past Surgical History:  Procedure Laterality Date   ENDOVENOUS ABLATION SAPHENOUS VEIN W/ LASER Right 08/20/2020   endovenous laser ablation right greater saphenous vein by Medford Blade MD    ENDOVENOUS ABLATION SAPHENOUS VEIN W/ LASER Left 11/16/2022   endovenous laser ablation left greater  saphenous vein by Medford Blade MD   INTRAOPERATIVE TRANSTHORACIC ECHOCARDIOGRAM N/A 02/07/2023   Procedure: INTRAOPERATIVE TRANSTHORACIC ECHOCARDIOGRAM;  Surgeon: Thukkani, Arun K, MD;  Location: [facility] INVASIVE CV LAB;  Service: Open Heart Surgery;  Laterality: N/A;   JOINT REPLACEMENT     left knee replacement x 2   KNEE ARTHROSCOPY Bilateral    LEFT HEART CATH AND CORONARY ANGIOGRAPHY N/A 01/17/2018   Procedure: LEFT HEART CATH AND CORONARY ANGIOGRAPHY;  Surgeon: Anner Alm ORN, MD;  Location: [facility] INVASIVE CV LAB;  Service: Cardiovascular;  Laterality: N/A;   PRESSURE SENSOR/CARDIOMEMS N/A 08/26/2021   Procedure: PRESSURE SENSOR/CARDIOMEMS;  Surgeon: Rolan Ezra RAMAN, MD;  Location: [facility] INVASIVE CV LAB;  Service: Cardiovascular;  Laterality: N/A;   RIGHT HEART CATH N/A 08/26/2021   Procedure: RIGHT HEART CATH;  Surgeon: Rolan Ezra RAMAN, MD;  Location[facility] INVASIVE CV LAB;  Service: Cardiovascular;  Laterality: N/A;   RIGHT HEART CATH AND CORONARY ANGIOGRAPHY N/A 01/26/2023   Procedure: RIGHT HEART CATH AND CORONARY ANGIOGRAPHY;  Surgeon: Wendel Lurena POUR, MD;  Location: [facility] INVASIVE CV LAB;  Service: Cardiovascular;  Laterality: N/A;   TEE WITHOUT CARDIOVERSION N/A 08/26/2021   Procedure: TRANSESOPHAGEAL ECHOCARDIOGRAM (TEE);  Surgeon: Rolan Ezra RAMAN, MD;  Location: [facility] ENDOSCOPY;  Service: Cardiovascular;  Laterality: N/A;   TOOTH EXTRACTION N/A 02/03/2023   Procedure: DENTAL RESTORATION/EXTRACTIONS;  Surgeon: Sheryle Hamilton, DMD;  Location: [facility] OR;  Service: Oral Surgery;  Laterality: N/A;   TRANSCATHETER AORTIC VALVE REPLACEMENT, TRANSFEMORAL Right 02/07/2023   Procedure: Transcatheter Aortic Valve Replacement, Transfemoral;  Surgeon: Wendel Lurena POUR, MD;  Location: [facility] INVASIVE CV LAB;  Service: Open Heart Surgery;  Laterality: Right;   TRANSESOPHAGEAL ECHOCARDIOGRAM (CATH LAB) N/A 12/07/2023   Procedure: TRANSESOPHAGEAL ECHOCARDIOGRAM;  Surgeon: Santo Stanly LABOR, MD;  Location: [facility] INVASIVE CV  LAB;  Service: Cardiovascular;  Laterality: N/A;   UMBILICAL HERNIA REPAIR      Medications: Current Outpatient Medications  Medication Sig Dispense Refill   acetaminophen  (TYLENOL ) 650 MG CR tablet Take 650 mg by mouth every 8 (eight) hours as needed for pain.     albuterol  (VENTOLIN  HFA) 108 (90 Base) MCG/ACT inhaler Inhale 2 puffs into the lungs every 6 (six) hours as needed for wheezing or shortness of breath. 1 each 6   allopurinol  (ZYLOPRIM ) 100 MG tablet Take 1 tablet (100 mg total) by mouth daily.     apixaban  (ELIQUIS ) 5 MG TABS tablet Take 1 tablet (5 mg total) by mouth 2 (two) times daily. 180 tablet 3   ARIPiprazole  (ABILIFY ) 5 MG tablet TAKE 1 TABLET BY MOUTH EVERYDAY AT BEDTIME 60 tablet 2   atorvastatin  (LIPITOR ) 80 MG tablet Take 1 tablet (80 mg total) by mouth at bedtime. 30 tablet 3   bisacodyl  (DULCOLAX) 5 MG EC tablet Take 10 mg by mouth daily as needed for moderate constipation.     busPIRone  (BUSPAR ) 5 MG tablet Take 1 tablet (5 mg total) by mouth 3 (three) times daily. 90 tablet 1   cephALEXin  (KEFLEX ) 500 MG capsule Take 1 capsule (500 mg total) by mouth 4 (four) times daily. (Patient not taking: Reported on 02/16/2024) 28 capsule 0   colchicine  0.6 MG tablet Take 1 tablet (0.6 mg total) by mouth daily. 30 tablet 0   cyanocobalamin  (VITAMIN B12) 1000 MCG tablet Take 1,000 mcg by mouth daily.     fenofibrate  (TRICOR ) 145 MG tablet Take 1 tablet (145 mg total) by mouth daily. 90 tablet 3   ferrous sulfate  325 (65 FE) MG tablet Take 325 mg by mouth daily.     fluticasone  (FLONASE ) 50 MCG/ACT nasal spray Place 2 sprays into both nostrils daily.      guaiFENesin -dextromethorphan (ROBITUSSIN DM) 100-10 MG/5ML syrup Take 5 mLs by mouth every 4 (four) hours as needed for cough.     HUMALOG KWIKPEN 100 UNIT/ML KwikPen Inject 2-15 Units into the skin. 121-150 2 units 151-200 3 units  201-250 5 units  251-300 8 units  301-350 11 units  351-400 15 units      HYDROcodone -acetaminophen  (NORCO/VICODIN) 5-325 MG tablet Take 1 tablet by mouth every 6 (six) hours as needed for severe pain (pain score 7-10). (Patient not taking: Reported on 02/16/2024) 10 tablet 0   INCRUSE ELLIPTA  62.5 MCG/ACT AEPB Inhale 1 puff into the lungs daily.     insulin  aspart (NOVOLOG ) 100 UNIT/ML injection Inject 0-15 Units into the skin 3 (three) times daily with meals. Sliding scale  CBG 70 - 120: 0 units: CBG 121 - 150: 2 units; CBG 151 - 200: 3 units; CBG 201 - 250: 5 units; CBG 251 - 300: 8 units;CBG 301 - 350: 11 units; CBG 351 - 400: 15 units; CBG > 400 :  15 units and notify MD (Patient not taking: Reported on 02/16/2024)     Lacosamide  150 MG TABS Take 1 tablet (150 mg total) by mouth 2 (two) times daily. 10 tablet 0   levETIRAcetam  (KEPPRA ) 500 MG tablet Take 1 tablet (500 mg total) by mouth 2 (two) times daily. 180 tablet 3   loperamide  (IMODIUM ) 2 MG capsule Take 2 mg by mouth daily as needed for diarrhea or loose stools.     methocarbamol  (ROBAXIN ) 500 MG tablet Take 500 mg by mouth every 8 (eight) hours as needed for muscle spasms.     metolazone  (ZAROXOLYN ) 2.5 MG tablet Take 2.5 mg by mouth once a week. Every Tuesday.     midodrine  (PROAMATINE ) 5 MG tablet Take 1 tablet (5 mg total) by mouth 3 (three) times daily with meals.     omega-3 acid ethyl esters (LOVAZA ) 1 g capsule Take 2 g by mouth 2 (two) times daily.     omeprazole (PRILOSEC) 20 MG capsule Take 20 mg by mouth daily.     polyethylene glycol powder (GLYCOLAX /MIRALAX ) 17 GM/SCOOP powder Mix in water as directed and take 1 capful (17 g) by mouth daily. 238 g 0   potassium chloride  SA (KLOR-CON  M20) 20 MEQ tablet Take 1-2 tablets (20-40 mEq total) by mouth See admin instructions. Take 2 tablets in the morning and 1 tablet in the evening (Patient taking differently: Take 20-40 mEq by mouth See admin instructions. Take 40 meq in the morning and 20 meq in the evening.)     prazosin  (MINIPRESS ) 2 MG capsule Take 2 mg  by mouth at bedtime.     pregabalin  (LYRICA ) 150 MG capsule Take 1 capsule (150 mg total) by mouth 3 (three) times daily. 15 capsule 0   rOPINIRole  (REQUIP ) 0.25 MG tablet Take 1 tablet (0.25 mg total) by mouth 2 (two) times daily.     sennosides-docusate sodium  (SENOKOT-S) 8.6-50 MG tablet Take 2 tablets by mouth in the morning and at bedtime.     spironolactone  (ALDACTONE ) 25 MG tablet Take 12.5 mg by mouth daily.     tirzepatide  (MOUNJARO ) 15 MG/0.5ML Pen INJECT 15 MG INTO THE SKIN ONCE A WEEK. (Patient taking differently: Inject 15 mg into the skin once a week. Mondays) 6 mL 0   torsemide  (DEMADEX ) 20 MG tablet Take 1 tablet (20 mg total) by mouth 2 (two) times daily.     traZODone  (DESYREL ) 150 MG tablet Take 1 tablet (150 mg total) by mouth at bedtime. 30 tablet 0   venlafaxine  XR (EFFEXOR -XR) 150 MG 24 hr capsule Take 150 mg by mouth daily with breakfast.     No current facility-administered medications for this visit.    Allergies  Allergen Reactions   Niacin And Related Other (See Comments)    Redness, flushing   Ppd [Tuberculin Purified Protein Derivative]     Unknown rxn (MAR)   Tubersol [Tuberculin, Ppd] Other (See Comments)    Unknown rxn (MAR)   Firvanq  [Vancomycin ] Other (See Comments)    Red man syndrome   Vibramycin  [Doxycycline ] Rash    Diagnoses:    ICD-10-CM   1. Major depressive disorder, recurrent episode, moderate (HCC)  F33.1     2. Generalized anxiety disorder  F41.1       Treatment Provided: Patient presented to session to address concerns of anxiety and depression.  Counselor provided person-centered counseling including building of rapport, active listening; clinical assessment; facilitation of PHQ-9 with a score of 17, and GAD-7 with  a score of 19; safety planning.  Patient endorsed experience of manic symptoms, trauma response pattern and panic attacks by history; counselor and patient discussed continuing to discuss symptomology upon follow-up.   Patient shared that he has been struggling with care received at skilled nursing facility he currently resides in, and voiced worries regarding a change in facilities and worry about where he might live/receive care as an alternative.  He voiced experience of challenges in getting wound on his leg dressed in a timely fashion recently, and bathing delays; he reported having initiated a complaint with facility and having spoken with an ombudsman about his concerns.  Counselor encouraged patient to continue in self advocacy efforts, and counselor and patient discussed continuing to track patient experience regarding his health care needs.  Patient reported experience of frustration and vague SI, which has led him to call the [place] hotline twice in recent months.  He endorsed passive SI at time of session, and counselor assessed for patient wellness and safety, and discussed safety planning with patient; patient voiced willingness to utilize hotlines and speak with medical staff regarding any exacerbated symptomology.  Patient shared regarding important relationship in his life, with significant other, and challenges they have faced financially and with their health.  He voiced his Bible studies and friends as significant coping resources in his life at this time as well.  Patient identified family to live out of state.  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  He voiced his history of panic attacks as related to his work experience, and to have experienced emotional dysregulation in the way of anger by history.  Patient voiced difficulty with transportation given his needs with his wheelchair and health concerns, and need to plan ahead for sessions due to this factor, with telehealth also presenting challenges.  Counselor and patient discussed patient sharing more regarding his  symptoms with his prescribing provider upon their pending follow-up visit.  Plan of Care: Patient is scheduled for follow-up; continue to build rapport and to assess symptoms including via MDQ and PCL 5; continue to assess patient personal history; discuss treatment plan and obtain consent.  Almarie ONEIDA Sprang, [facility]

## 2024-02-27 DIAGNOSIS — M6281 Muscle weakness (generalized): Secondary | ICD-10-CM | POA: Diagnosis not present

## 2024-02-27 DIAGNOSIS — R279 Unspecified lack of coordination: Secondary | ICD-10-CM | POA: Diagnosis not present

## 2024-02-28 DIAGNOSIS — M6281 Muscle weakness (generalized): Secondary | ICD-10-CM | POA: Diagnosis not present

## 2024-02-28 DIAGNOSIS — R279 Unspecified lack of coordination: Secondary | ICD-10-CM | POA: Diagnosis not present

## 2024-02-29 DIAGNOSIS — M6281 Muscle weakness (generalized): Secondary | ICD-10-CM | POA: Diagnosis not present

## 2024-02-29 DIAGNOSIS — R279 Unspecified lack of coordination: Secondary | ICD-10-CM | POA: Diagnosis not present

## 2024-03-01 DIAGNOSIS — R279 Unspecified lack of coordination: Secondary | ICD-10-CM | POA: Diagnosis not present

## 2024-03-01 DIAGNOSIS — L89890 Pressure ulcer of other site, unstageable: Secondary | ICD-10-CM | POA: Diagnosis not present

## 2024-03-01 DIAGNOSIS — R278 Other lack of coordination: Secondary | ICD-10-CM | POA: Diagnosis not present

## 2024-03-01 DIAGNOSIS — E1169 Type 2 diabetes mellitus with other specified complication: Secondary | ICD-10-CM | POA: Diagnosis not present

## 2024-03-01 DIAGNOSIS — R239 Unspecified skin changes: Secondary | ICD-10-CM | POA: Diagnosis not present

## 2024-03-01 DIAGNOSIS — I739 Peripheral vascular disease, unspecified: Secondary | ICD-10-CM | POA: Diagnosis not present

## 2024-03-01 DIAGNOSIS — M6281 Muscle weakness (generalized): Secondary | ICD-10-CM | POA: Diagnosis not present

## 2024-03-02 DIAGNOSIS — M6281 Muscle weakness (generalized): Secondary | ICD-10-CM | POA: Diagnosis not present

## 2024-03-02 DIAGNOSIS — R279 Unspecified lack of coordination: Secondary | ICD-10-CM | POA: Diagnosis not present

## 2024-03-04 ENCOUNTER — Ambulatory Visit (HOSPITAL_COMMUNITY)
Admission: RE | Admit: 2024-03-04 | Discharge: 2024-03-04 | Disposition: A | Discharge status: Home / Self Care | Source: Ambulatory Visit | Attending: Surgery | Admitting: Surgery

## 2024-03-04 DIAGNOSIS — I872 Venous insufficiency (chronic) (peripheral): Secondary | ICD-10-CM | POA: Diagnosis not present

## 2024-03-04 DIAGNOSIS — R279 Unspecified lack of coordination: Secondary | ICD-10-CM | POA: Diagnosis not present

## 2024-03-04 DIAGNOSIS — M6281 Muscle weakness (generalized): Secondary | ICD-10-CM | POA: Diagnosis not present

## 2024-03-05 DIAGNOSIS — L89890 Pressure ulcer of other site, unstageable: Secondary | ICD-10-CM | POA: Diagnosis not present

## 2024-03-05 NOTE — Progress Notes (Unsigned)
 [facility]   MULTIDISCIPLINARY HEART VALVE CLINIC                                     Cardiology Office Note:    Date:  03/07/2024   ID:  Lawrence [name], DOB [birth year], MRN [id number]  PCP:  Katina Pfeiffer, PA-C  [facility] Cardiologist:  Wilbert Bihari, MD  [facility] Structural heart: Lurena MARLA Red, MD [facility] Electrophysiologist:  Will Gladis Norton, MD   Referring MD: Katina Pfeiffer, PA-C   1 year s/p TAVR  History of Present Illness:    Lawrence [name] is a 68 y.o. male with a hx of non-obstructive CAD, chronic diastolic CHF, paroxysmal atrial fibrillation on Eliquis , HTN, HLD, morbid obesity (BMI 46), T2DM with peripheral neuropathy, COPD, OSA on CPAP, seizure disorder, morbid obesity with a BMI of 47, prior tobacco use, CVA and severe aortic stenosis s/p TAVR (02/07/23) who presents to clinic for follow up.    Recently admitted 6/16-02/08/23 for acute hypoxic respiratory failure 2/2 acute CHF and severe AS. He had ongoing chest pain and shortness of breath. He was diuresed over 30L. Had dental extractions and started on Keflex . Underwent TAVR with a 34 mm Medtronic Evolut FX THV via the TF approach on 02/07/23. Post operative echo showed EF 60%, severe LVH, normally functioning TAVR with a mean gradient of 17 mmHg and no PVL and small pericardial effusion. He was discharged home on Eliquis  5 mg BID, Farixga 10mg  daily, spiro 12.5mg  daily, torsemide  80mg  BID. Also discharged with a Zio AT given a new LBBB and pre op RBBB. Zio patch did not show any HAVB. Given constellation of peripheral neuropathy, diastolic CHF and conduction system disease/atrial arrhythmias, he is underwent w/u to screen for cardiac amyloidosis. Multiple Myeloma = No M-spike, KFLC elevated 23.1, K/L LC Ratio normal 1.27. PYP scan was equivocal. Consider repeating in a 1-2 years.    Since TAVR he has been admitted to the hospital 7 times to the hospital. Admitted 06/2023 with acute  CVA and discharged to a SNF. Admitted in 12/2023 with sepsis and e coli bacteremia. TEE was performed 5/1, with no evidence of vegetation/endocarditis and normal TAVR valve function.    Today the patient presents to clinic for follow up. Living at [facility] since [place] and then became permanent in May. Stays in a wheelchair most of the time due to issues with walking since stroke. His LE edema has improved. He does have some orthopnea but much imrpoved. No dizziness or syncope. No chest pain. Very sedentary.   Past Medical History:  Diagnosis Date   Acquired dilation of ascending aorta and aortic root (HCC)    40mm by echo 01/2021   Adenomatous colon polyp 2007   Anemia    Anxiety    Asthma    BPH without obstruction/lower urinary tract symptoms 02/22/2017   Chronic diastolic (congestive) heart failure (HCC)    Chronic venous stasis 03/07/2019   COPD (chronic obstructive pulmonary disease) (HCC)    Coronary artery calcification seen on CAT scan    Depression    Diabetic neuropathy (HCC) 09/11/2019   History of colon polyps 08/24/2018   Hypertension    Morbid obesity (HCC)    OSA (obstructive sleep apnea)    Pain due to onychomycosis of toenails of both feet 09/11/2019   Peripheral neuropathy 02/22/2017   Primary osteoarthritis, left shoulder 03/05/2017  PTSD (post-traumatic stress disorder)    Pure hypercholesterolemia    QT prolongation 03/07/2019   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 Wendel and Dr. Lucas   Seizures [facility])    Severe aortic stenosis    Sinus tachycardia 03/07/2019   Sleep apnea    CPAP   Type 2 diabetes mellitus with vascular disease (HCC) 09/11/2019     Current Medications: Current Meds  Medication Sig   acetaminophen  (TYLENOL ) 650 MG CR tablet Take 650 mg by mouth every 8 (eight) hours as needed for pain.   albuterol  (VENTOLIN  HFA) 108 (90 Base) MCG/ACT inhaler Inhale 2 puffs into  the lungs every 6 (six) hours as needed for wheezing or shortness of breath.   allopurinol  (ZYLOPRIM ) 100 MG tablet Take 1 tablet (100 mg total) by mouth daily.   apixaban  (ELIQUIS ) 5 MG TABS tablet Take 1 tablet (5 mg total) by mouth 2 (two) times daily.   ARIPiprazole  (ABILIFY ) 5 MG tablet TAKE 1 TABLET BY MOUTH EVERYDAY AT BEDTIME   atorvastatin  (LIPITOR ) 80 MG tablet Take 1 tablet (80 mg total) by mouth at bedtime.   busPIRone  (BUSPAR ) 5 MG tablet Take 1 tablet (5 mg total) by mouth 3 (three) times daily.   cephALEXin  (KEFLEX ) 500 MG capsule Take 1 capsule (500 mg total) by mouth 4 (four) times daily.   colchicine  0.6 MG tablet Take 1 tablet (0.6 mg total) by mouth daily.   cyanocobalamin  (VITAMIN B12) 1000 MCG tablet Take 1,000 mcg by mouth daily.   fenofibrate  (TRICOR ) 145 MG tablet Take 1 tablet (145 mg total) by mouth daily.   ferrous sulfate  325 (65 FE) MG tablet Take 325 mg by mouth daily.   fluticasone  (FLONASE ) 50 MCG/ACT nasal spray Place 2 sprays into both nostrils daily.    HUMALOG KWIKPEN 100 UNIT/ML KwikPen Inject 2-15 Units into the skin. 121-150 2 units 151-200 3 units  201-250 5 units  251-300 8 units  301-350 11 units  351-400 15 units   HYDROcodone -acetaminophen  (NORCO/VICODIN) 5-325 MG tablet Take 1 tablet by mouth every 6 (six) hours as needed for severe pain (pain score 7-10).   INCRUSE ELLIPTA  62.5 MCG/ACT AEPB Inhale 1 puff into the lungs daily.   insulin  aspart (NOVOLOG ) 100 UNIT/ML injection Inject 0-15 Units into the skin 3 (three) times daily with meals. Sliding scale  CBG 70 - 120: 0 units: CBG 121 - 150: 2 units; CBG 151 - 200: 3 units; CBG 201 - 250: 5 units; CBG 251 - 300: 8 units;CBG 301 - 350: 11 units; CBG 351 - 400: 15 units; CBG > 400 : 15 units and notify MD   Lacosamide  150 MG TABS Take 1 tablet (150 mg total) by mouth 2 (two) times daily.   levETIRAcetam  (KEPPRA ) 500 MG tablet Take 1 tablet (500 mg total) by mouth 2 (two) times daily.   loperamide   (IMODIUM ) 2 MG capsule Take 2 mg by mouth daily as needed for diarrhea or loose stools.   methocarbamol  (ROBAXIN ) 500 MG tablet Take 500 mg by mouth every 8 (eight) hours as needed for muscle spasms.   metolazone  (ZAROXOLYN ) 2.5 MG tablet Take 2.5 mg by mouth once a week. Every Tuesday.   midodrine  (PROAMATINE ) 5 MG tablet Take 1 tablet (5 mg total) by mouth 3 (three) times daily with meals.   omega-3 acid ethyl esters (LOVAZA ) 1 g capsule Take 2 g by mouth 2 (two) times daily.  omeprazole (PRILOSEC) 20 MG capsule Take 20 mg by mouth daily.   polyethylene glycol powder (GLYCOLAX /MIRALAX ) 17 GM/SCOOP powder Mix in water as directed and take 1 capful (17 g) by mouth daily.   potassium chloride  SA (KLOR-CON  M20) 20 MEQ tablet Take 1-2 tablets (20-40 mEq total) by mouth See admin instructions. Take 2 tablets in the morning and 1 tablet in the evening (Patient taking differently: Take 20-40 mEq by mouth See admin instructions. Take 40 meq in the morning and 20 meq in the evening.)   prazosin  (MINIPRESS ) 2 MG capsule Take 2 mg by mouth at bedtime.   pregabalin  (LYRICA ) 150 MG capsule Take 1 capsule (150 mg total) by mouth 3 (three) times daily.   rOPINIRole  (REQUIP ) 0.25 MG tablet Take 1 tablet (0.25 mg total) by mouth 2 (two) times daily.   spironolactone  (ALDACTONE ) 25 MG tablet Take 12.5 mg by mouth daily.   tirzepatide  (MOUNJARO ) 15 MG/0.5ML Pen INJECT 15 MG INTO THE SKIN ONCE A WEEK. (Patient taking differently: Inject 15 mg into the skin once a week. Mondays)   torsemide  (DEMADEX ) 20 MG tablet Take 1 tablet (20 mg total) by mouth 2 (two) times daily.   traZODone  (DESYREL ) 150 MG tablet Take 1 tablet (150 mg total) by mouth at bedtime.   venlafaxine  XR (EFFEXOR -XR) 150 MG 24 hr capsule Take 150 mg by mouth daily with breakfast.      ROS:   Please see the history of present illness.    All other systems reviewed and are negative.  EKGs       Risk Assessment/Calculations:    CHA2DS2-VASc  Score = 7   This indicates a 11.2% annual risk of stroke. The patient's score is based upon: CHF History: 1 HTN History: 1 Diabetes History: 1 Stroke History: 2 Vascular Disease History: 1 Age Score: 1 Gender Score: 0           Physical Exam:    VS:  BP 118/76   Pulse 78   Ht 6' 2 (1.88 m)   Wt (!) 332 lb (150.6 kg)   SpO2 96%   BMI 42.63 kg/m     Wt Readings from Last 3 Encounters:  03/07/24 (!) 332 lb (150.6 kg)  03/06/24 (!) 332 lb 14.3 oz (151 kg)  02/16/24 (!) 345 lb (156.5 kg)     GEN: Well nourished, well developed in no acute distress, obese NECK: No JVD CARDIAC: RRR, no murmurs, rubs, gallops RESPIRATORY:  Clear to auscultation without rales, wheezing or rhonchi  ABDOMEN: Soft, non-tender, non-distended EXTREMITIES:  1+ bilateral LE edema with chronic venous stasis changes. No deformity.    ASSESSMENT:    1. S/P TAVR (transcatheter aortic valve replacement)   2. PAF (paroxysmal atrial fibrillation) (HCC)   3. Chronic diastolic CHF (congestive heart failure) (HCC)   4. LBBB (left bundle branch block)   5. OSA (obstructive sleep apnea)   6. Morbid obesity (HCC)   7. History of CVA (cerebrovascular accident)     PLAN:    In order of problems listed above:  Severe AS s/p TAVR:  -- Echo today EF 55%, normally functioning TAVR with a mean gradient of 13 mmHg and no PVL. -- NYHA class II symptoms.  -- Continue on Eliquis  5mg  BID.  -- SBE prophylaxis discussed; he would like to hold off on calling in abx for now.  -- Continue regular follow up with Dr. Rolan    PAF:  -- Sounds regular on exam today.  --  Continue Eliquis  5mg  BID. -- Had labs yesterday which showed creat 1.0 and Hg 13.7 (personally reviewed).    Chronic diastolic CHF:  -- Followed by advanced CHF team. -- Appears euvolemic. Improved since TAVR.  -- Continue Farixga 10mg  daily, spiro 12.5mg  daily, torsemide  20mg  BID and KDur 20meq BID as well as metolazone  2.5mg  q week.    New  LBBB s/p TAVR:  -- Zio AT with no high grade heart block.    OSA/OHS:  -- Using Bipap nightly.  -- Continue Bipap.    Morbid obesity:  -- Body mass index is 42.63 kg/m. -- Continue tirzepatide , wt is trending down   Hx of CVA:  -- Continue Eliquis .  -- Continue atorvastatin  80mg  daily.    Medication Adjustments/Labs and Tests Ordered: Current medicines are reviewed at length with the patient today.  Concerns regarding medicines are outlined above.  No orders of the defined types were placed in this encounter.  No orders of the defined types were placed in this encounter.   Patient Instructions  Medication Instructions:  Your physician recommends that you continue on your current medications as directed. Please refer to the Current Medication list given to you today.  *If you need a refill on your cardiac medications before your next appointment, please call your pharmacy*  Lab Work: NONE NEEDED If you have labs (blood work) drawn today and your tests are completely normal, you will receive your results only by: MyChart Message (if you have MyChart) OR A paper copy in the mail If you have any lab test that is abnormal or we need to change your treatment, we will call you to review the results.  Testing/Procedures: NONE NEEDED  Follow-Up: At [facility], you and your health needs are our priority.  As part of our continuing mission to provide you with exceptional heart care, our providers are all part of one team.  This team includes your primary Cardiologist (physician) and Advanced Practice Providers or APPs (Physician Assistants and Nurse Practitioners) who all work together to provide you with the care you need, when you need it.  Your next appointment:   6 month(s)  Provider:   [facility]  We recommend signing up for the patient portal called MyChart.  Sign up information is provided on this After Visit Summary.  MyChart is used to connect with patients  for Virtual Visits (Telemedicine).  Patients are able to view lab/test results, encounter notes, upcoming appointments, etc.  Non-urgent messages can be sent to your provider as well.   To learn more about what you can do with MyChart, go to [email or web address].          Signed, Lamarr Hummer, PA-C  03/07/2024 6:45 PM    [facility]

## 2024-03-06 ENCOUNTER — Emergency Department (HOSPITAL_COMMUNITY)

## 2024-03-06 ENCOUNTER — Other Ambulatory Visit: Payer: Self-pay

## 2024-03-06 ENCOUNTER — Emergency Department (HOSPITAL_COMMUNITY)
Admission: EM | Admit: 2024-03-06 | Discharge: 2024-03-06 | Disposition: A | Discharge status: Skilled Nursing Facility | Attending: Emergency Medicine | Admitting: Emergency Medicine

## 2024-03-06 ENCOUNTER — Encounter (HOSPITAL_COMMUNITY): Payer: Self-pay

## 2024-03-06 DIAGNOSIS — J449 Chronic obstructive pulmonary disease, unspecified: Secondary | ICD-10-CM | POA: Insufficient documentation

## 2024-03-06 DIAGNOSIS — R2990 Unspecified symptoms and signs involving the nervous system: Secondary | ICD-10-CM

## 2024-03-06 DIAGNOSIS — Z7901 Long term (current) use of anticoagulants: Secondary | ICD-10-CM | POA: Insufficient documentation

## 2024-03-06 DIAGNOSIS — E1140 Type 2 diabetes mellitus with diabetic neuropathy, unspecified: Secondary | ICD-10-CM | POA: Insufficient documentation

## 2024-03-06 DIAGNOSIS — I5033 Acute on chronic diastolic (congestive) heart failure: Secondary | ICD-10-CM | POA: Diagnosis not present

## 2024-03-06 DIAGNOSIS — J45909 Unspecified asthma, uncomplicated: Secondary | ICD-10-CM | POA: Insufficient documentation

## 2024-03-06 DIAGNOSIS — R200 Anesthesia of skin: Secondary | ICD-10-CM | POA: Diagnosis not present

## 2024-03-06 DIAGNOSIS — Z743 Need for continuous supervision: Secondary | ICD-10-CM | POA: Diagnosis not present

## 2024-03-06 DIAGNOSIS — Z7401 Bed confinement status: Secondary | ICD-10-CM | POA: Diagnosis not present

## 2024-03-06 DIAGNOSIS — R479 Unspecified speech disturbances: Secondary | ICD-10-CM | POA: Diagnosis not present

## 2024-03-06 DIAGNOSIS — R531 Weakness: Secondary | ICD-10-CM | POA: Diagnosis not present

## 2024-03-06 DIAGNOSIS — I10 Essential (primary) hypertension: Secondary | ICD-10-CM | POA: Insufficient documentation

## 2024-03-06 DIAGNOSIS — Z794 Long term (current) use of insulin: Secondary | ICD-10-CM | POA: Insufficient documentation

## 2024-03-06 DIAGNOSIS — R569 Unspecified convulsions: Secondary | ICD-10-CM | POA: Diagnosis not present

## 2024-03-06 DIAGNOSIS — I2510 Atherosclerotic heart disease of native coronary artery without angina pectoris: Secondary | ICD-10-CM | POA: Diagnosis not present

## 2024-03-06 DIAGNOSIS — I639 Cerebral infarction, unspecified: Secondary | ICD-10-CM | POA: Diagnosis not present

## 2024-03-06 DIAGNOSIS — R29818 Other symptoms and signs involving the nervous system: Secondary | ICD-10-CM | POA: Diagnosis not present

## 2024-03-06 LAB — I-STAT VENOUS BLOOD GAS, ED
Acid-Base Excess: 2 mmol/L (ref 0.0–2.0)
Bicarbonate: 28.1 mmol/L — ABNORMAL HIGH (ref 20.0–28.0)
Calcium, Ion: 1.17 mmol/L (ref 1.15–1.40)
HCT: 39 % (ref 39.0–52.0)
Hemoglobin: 13.3 g/dL (ref 13.0–17.0)
O2 Saturation: 67 %
Potassium: 3.3 mmol/L — ABNORMAL LOW (ref 3.5–5.1)
Sodium: 141 mmol/L (ref 135–145)
TCO2: 30 mmol/L (ref 22–32)
pCO2, Ven: 49 mmHg (ref 44–60)
pH, Ven: 7.367 (ref 7.25–7.43)
pO2, Ven: 37 mmHg (ref 32–45)

## 2024-03-06 LAB — CBC
HCT: 41.8 % (ref 39.0–52.0)
Hemoglobin: 13.7 g/dL (ref 13.0–17.0)
MCH: 28.7 pg (ref 26.0–34.0)
MCHC: 32.8 g/dL (ref 30.0–36.0)
MCV: 87.4 fL (ref 80.0–100.0)
Platelets: 271 K/uL (ref 150–400)
RBC: 4.78 MIL/uL (ref 4.22–5.81)
RDW: 13.2 % (ref 11.5–15.5)
WBC: 8.7 K/uL (ref 4.0–10.5)
nRBC: 0 % (ref 0.0–0.2)

## 2024-03-06 LAB — URINALYSIS, ROUTINE W REFLEX MICROSCOPIC
Bilirubin Urine: NEGATIVE
Glucose, UA: NEGATIVE mg/dL
Hgb urine dipstick: NEGATIVE
Ketones, ur: NEGATIVE mg/dL
Nitrite: NEGATIVE
Protein, ur: NEGATIVE mg/dL
Specific Gravity, Urine: 1.021 (ref 1.005–1.030)
pH: 6 (ref 5.0–8.0)

## 2024-03-06 LAB — I-STAT CHEM 8, ED
BUN: 10 mg/dL (ref 8–23)
Calcium, Ion: 1.2 mmol/L (ref 1.15–1.40)
Chloride: 101 mmol/L (ref 98–111)
Creatinine, Ser: 1 mg/dL (ref 0.61–1.24)
Glucose, Bld: 154 mg/dL — ABNORMAL HIGH (ref 70–99)
HCT: 41 % (ref 39.0–52.0)
Hemoglobin: 13.9 g/dL (ref 13.0–17.0)
Potassium: 3.6 mmol/L (ref 3.5–5.1)
Sodium: 140 mmol/L (ref 135–145)
TCO2: 27 mmol/L (ref 22–32)

## 2024-03-06 LAB — DIFFERENTIAL
Abs Immature Granulocytes: 0.07 K/uL (ref 0.00–0.07)
Basophils Absolute: 0.1 K/uL (ref 0.0–0.1)
Basophils Relative: 1 %
Eosinophils Absolute: 0.6 K/uL — ABNORMAL HIGH (ref 0.0–0.5)
Eosinophils Relative: 6 %
Immature Granulocytes: 1 %
Lymphocytes Relative: 21 %
Lymphs Abs: 1.8 K/uL (ref 0.7–4.0)
Monocytes Absolute: 0.7 K/uL (ref 0.1–1.0)
Monocytes Relative: 8 %
Neutro Abs: 5.5 K/uL (ref 1.7–7.7)
Neutrophils Relative %: 63 %

## 2024-03-06 LAB — COMPREHENSIVE METABOLIC PANEL WITH GFR
ALT: 28 U/L (ref 0–44)
AST: 21 U/L (ref 15–41)
Albumin: 3.4 g/dL — ABNORMAL LOW (ref 3.5–5.0)
Alkaline Phosphatase: 50 U/L (ref 38–126)
Anion gap: 8 (ref 5–15)
BUN: 10 mg/dL (ref 8–23)
CO2: 25 mmol/L (ref 22–32)
Calcium: 8.7 mg/dL — ABNORMAL LOW (ref 8.9–10.3)
Chloride: 103 mmol/L (ref 98–111)
Creatinine, Ser: 1 mg/dL (ref 0.61–1.24)
GFR, Estimated: >60 mL/min (ref >60)
Glucose, Bld: 158 mg/dL — ABNORMAL HIGH (ref 70–99)
Potassium: 3.6 mmol/L (ref 3.5–5.1)
Sodium: 136 mmol/L (ref 135–145)
Total Bilirubin: 0.5 mg/dL (ref 0.0–1.2)
Total Protein: 6.2 g/dL — ABNORMAL LOW (ref 6.5–8.1)

## 2024-03-06 LAB — RAPID URINE DRUG SCREEN, HOSP PERFORMED
Amphetamines: NOT DETECTED
Barbiturates: NOT DETECTED
Benzodiazepines: NOT DETECTED
Cocaine: NOT DETECTED
Opiates: POSITIVE — AB
Tetrahydrocannabinol: NOT DETECTED

## 2024-03-06 LAB — ETHANOL: Alcohol, Ethyl (B): <15 mg/dL (ref <15)

## 2024-03-06 LAB — CBG MONITORING, ED: Glucose-Capillary: 157 mg/dL — ABNORMAL HIGH (ref 70–99)

## 2024-03-06 LAB — PROTIME-INR
INR: 1.1 (ref 0.8–1.2)
Prothrombin Time: 14.6 s (ref 11.4–15.2)

## 2024-03-06 LAB — APTT: aPTT: 29 s (ref 24–36)

## 2024-03-06 MED ORDER — IOHEXOL 350 MG/ML SOLN
75.0000 mL | Freq: Once | INTRAVENOUS | Status: AC | PRN
  Administered 2024-03-06: 75 mL via INTRAVENOUS

## 2024-03-06 NOTE — Consult Note (Signed)
 NEUROLOGY CONSULT NOTE   Date of service: March 06, 2024 Patient Name: RAYLON LAMSON MRN:  [id number] DOB:  Jan 02, 1956 Chief Complaint: Left-sided weakness Requesting Provider: Bari Charmaine FALCON, MD  History of Present Illness  CARDARIUS SENAT is a 68 y.o. male with hx of previous stroke with reported left-sided deficits, who has come in with at least eight previous presentations for unilateral left-sided weakness as well as a couple of episodes of right-sided weakness with at least 10 MRIs performed in workup of these.  There was a punctate cerebellar stroke which did not seem to correspond to his symptoms on a single MRI from November 2024.  He does have a history of atrial fibrillation and takes Eliquis .  He laid down feeling relatively normal around 9 PM, and then noticed left-sided numbness when he awoke shortly before midnight.  He was transported as a code stroke and evaluated emergently by myself at the bridge.  He did have some left-sided weakness, though there was some inconsistency.  He was taken for CT/CTA which was negative.  LKW: 9 PM IV Thrombolysis: No, outside of window EVT: No, no LVO  NIHSS components Score: Comment  1a Level of Conscious 0[]  1[x]  2[]  3[]      1b LOC Questions 0[x]  1[]  2[]       1c LOC Commands 0[x]  1[]  2[]       2 Best Gaze 0[x]  1[]  2[]       3 Visual 0[x]  1[]  2[]  3[]      4 Facial Palsy 0[]  1[x]  2[]  3[]      5a Motor Arm - left 0[]  1[]  2[x]  3[]  4[]  UN[]    5b Motor Arm - Right 0[x]  1[]  2[]  3[]  4[]  UN[]    6a Motor Leg - Left 0[]  1[]  2[x]  3[]  4[]  UN[]    6b Motor Leg - Right 0[]  1[x]  2[]  3[]  4[]  UN[]    7 Limb Ataxia 0[x]  1[]  2[]  UN[]      8 Sensory 0[]  1[x]  2[]  UN[]      9 Best Language 0[x]  1[]  2[]  3[]      10 Dysarthria 0[]  1[x]  2[]  UN[]      11 Extinct. and Inattention 0[x]  1[]  2[]       TOTAL: 9      Past History   Past Medical History:  Diagnosis Date   Acquired dilation of ascending aorta and aortic root (HCC)    40mm by echo 01/2021    Adenomatous colon polyp 2007   Anemia    Anxiety    Asthma    BPH without obstruction/lower urinary tract symptoms 02/22/2017   Chronic diastolic (congestive) heart failure (HCC)    Chronic venous stasis 03/07/2019   COPD (chronic obstructive pulmonary disease) (HCC)    Coronary artery calcification seen on CAT scan    Depression    Diabetic neuropathy (HCC) 09/11/2019   History of colon polyps 08/24/2018   Hypertension    Morbid obesity (HCC)    OSA (obstructive sleep apnea)    Pain due to onychomycosis of toenails of both feet 09/11/2019   Peripheral neuropathy 02/22/2017   Primary osteoarthritis, left shoulder 03/05/2017   PTSD (post-traumatic stress disorder)    Pure hypercholesterolemia    QT prolongation 03/07/2019   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 Wendel and Dr. Lucas   Seizures [facility])    Severe aortic stenosis    Sinus tachycardia 03/07/2019   Sleep apnea    CPAP   Type 2 diabetes mellitus with  vascular disease (HCC) 09/11/2019    Past Surgical History:  Procedure Laterality Date   ENDOVENOUS ABLATION SAPHENOUS VEIN W/ LASER Right 08/20/2020   endovenous laser ablation right greater saphenous vein by Medford Blade MD    ENDOVENOUS ABLATION SAPHENOUS VEIN W/ LASER Left 11/16/2022   endovenous laser ablation left greater saphenous vein by Medford Blade MD   INTRAOPERATIVE TRANSTHORACIC ECHOCARDIOGRAM N/A 02/07/2023   Procedure: INTRAOPERATIVE TRANSTHORACIC ECHOCARDIOGRAM;  Surgeon: Wendel Lurena POUR, MD;  Location: [facility] INVASIVE CV LAB;  Service: Open Heart Surgery;  Laterality: N/A;   JOINT REPLACEMENT     left knee replacement x 2   KNEE ARTHROSCOPY Bilateral    LEFT HEART CATH AND CORONARY ANGIOGRAPHY N/A 01/17/2018   Procedure: LEFT HEART CATH AND CORONARY ANGIOGRAPHY;  Surgeon: Anner Alm ORN, MD;  Location: [facility] INVASIVE CV LAB;  Service: Cardiovascular;  Laterality: N/A;   PRESSURE  SENSOR/CARDIOMEMS N/A 08/26/2021   Procedure: PRESSURE SENSOR/CARDIOMEMS;  Surgeon: Rolan Ezra RAMAN, MD;  Location: [facility] INVASIVE CV LAB;  Service: Cardiovascular;  Laterality: N/A;   RIGHT HEART CATH N/A 08/26/2021   Procedure: RIGHT HEART CATH;  Surgeon: Rolan Ezra RAMAN, MD;  Location: [facility] INVASIVE CV LAB;  Service: Cardiovascular;  Laterality: N/A;   RIGHT HEART CATH AND CORONARY ANGIOGRAPHY N/A 01/26/2023   Procedure: RIGHT HEART CATH AND CORONARY ANGIOGRAPHY;  Surgeon: Wendel Lurena POUR, MD;  Location: [facility] INVASIVE CV LAB;  Service: Cardiovascular;  Laterality: N/A;   TEE WITHOUT CARDIOVERSION N/A 08/26/2021   Procedure: TRANSESOPHAGEAL ECHOCARDIOGRAM (TEE);  Surgeon: Rolan Ezra RAMAN, MD;  Location: [facility] ENDOSCOPY;  Service: Cardiovascular;  Laterality: N/A;   TOOTH EXTRACTION N/A 02/03/2023   Procedure: DENTAL RESTORATION/EXTRACTIONS;  Surgeon: Sheryle Hamilton, DMD;  Location: [facility] OR;  Service: Oral Surgery;  Laterality: N/A;   TRANSCATHETER AORTIC VALVE REPLACEMENT, TRANSFEMORAL Right 02/07/2023   Procedure: Transcatheter Aortic Valve Replacement, Transfemoral;  Surgeon: Wendel Lurena POUR, MD;  Location: [facility] INVASIVE CV LAB;  Service: Open Heart Surgery;  Laterality: Right;   TRANSESOPHAGEAL ECHOCARDIOGRAM (CATH LAB) N/A 12/07/2023   Procedure: TRANSESOPHAGEAL ECHOCARDIOGRAM;  Surgeon: Santo Stanly LABOR, MD;  Location: [facility] INVASIVE CV LAB;  Service: Cardiovascular;  Laterality: N/A;   UMBILICAL HERNIA REPAIR      Family History: Family History  Problem Relation Age of Onset   CAD Maternal Grandfather    Diabetes Other    Diabetes Mellitus II Neg Hx    Colon cancer Neg Hx    Esophageal cancer Neg Hx    Inflammatory bowel disease Neg Hx    Liver disease Neg Hx    Pancreatic cancer Neg Hx    Rectal cancer Neg Hx    Stomach cancer Neg Hx    Sleep apnea Neg Hx     Social History  reports that he quit smoking about 8 years ago. His smoking use included cigarettes. He started smoking about 52  years ago. He has a 44 pack-year smoking history. He has been exposed to tobacco smoke. He has never used smokeless tobacco. He reports current alcohol  use. He reports that he does not use drugs.  Allergies  Allergen Reactions   Niacin And Related Other (See Comments)    Redness, flushing   Ppd [Tuberculin Purified Protein Derivative]     Unknown rxn (MAR)   Tubersol [Tuberculin, Ppd] Other (See Comments)    Unknown rxn (MAR)   Firvanq  [Vancomycin ] Other (See Comments)    Red man syndrome   Vibramycin  [Doxycycline ] Rash    Medications  No current  facility-administered medications for this encounter.  Current Outpatient Medications:    acetaminophen  (TYLENOL ) 650 MG CR tablet, Take 650 mg by mouth every 8 (eight) hours as needed for pain., Disp: , Rfl:    albuterol  (VENTOLIN  HFA) 108 (90 Base) MCG/ACT inhaler, Inhale 2 puffs into the lungs every 6 (six) hours as needed for wheezing or shortness of breath., Disp: 1 each, Rfl: 6   allopurinol  (ZYLOPRIM ) 100 MG tablet, Take 1 tablet (100 mg total) by mouth daily., Disp: , Rfl:    apixaban  (ELIQUIS ) 5 MG TABS tablet, Take 1 tablet (5 mg total) by mouth 2 (two) times daily., Disp: 180 tablet, Rfl: 3   ARIPiprazole  (ABILIFY ) 5 MG tablet, TAKE 1 TABLET BY MOUTH EVERYDAY AT BEDTIME, Disp: 60 tablet, Rfl: 2   atorvastatin  (LIPITOR ) 80 MG tablet, Take 1 tablet (80 mg total) by mouth at bedtime., Disp: 30 tablet, Rfl: 3   bisacodyl  (DULCOLAX) 5 MG EC tablet, Take 10 mg by mouth daily as needed for moderate constipation., Disp: , Rfl:    busPIRone  (BUSPAR ) 5 MG tablet, Take 1 tablet (5 mg total) by mouth 3 (three) times daily., Disp: 90 tablet, Rfl: 1   cephALEXin  (KEFLEX ) 500 MG capsule, Take 1 capsule (500 mg total) by mouth 4 (four) times daily. (Patient not taking: Reported on 02/16/2024), Disp: 28 capsule, Rfl: 0   colchicine  0.6 MG tablet, Take 1 tablet (0.6 mg total) by mouth daily., Disp: 30 tablet, Rfl: 0   cyanocobalamin  (VITAMIN B12) 1000  MCG tablet, Take 1,000 mcg by mouth daily., Disp: , Rfl:    fenofibrate  (TRICOR ) 145 MG tablet, Take 1 tablet (145 mg total) by mouth daily., Disp: 90 tablet, Rfl: 3   ferrous sulfate  325 (65 FE) MG tablet, Take 325 mg by mouth daily., Disp: , Rfl:    fluticasone  (FLONASE ) 50 MCG/ACT nasal spray, Place 2 sprays into both nostrils daily. , Disp: , Rfl:    guaiFENesin -dextromethorphan (ROBITUSSIN DM) 100-10 MG/5ML syrup, Take 5 mLs by mouth every 4 (four) hours as needed for cough., Disp: , Rfl:    HUMALOG KWIKPEN 100 UNIT/ML KwikPen, Inject 2-15 Units into the skin. 121-150 2 units 151-200 3 units  201-250 5 units  251-300 8 units  301-350 11 units  351-400 15 units, Disp: , Rfl:    HYDROcodone -acetaminophen  (NORCO/VICODIN) 5-325 MG tablet, Take 1 tablet by mouth every 6 (six) hours as needed for severe pain (pain score 7-10). (Patient not taking: Reported on 02/16/2024), Disp: 10 tablet, Rfl: 0   INCRUSE ELLIPTA  62.5 MCG/ACT AEPB, Inhale 1 puff into the lungs daily., Disp: , Rfl:    insulin  aspart (NOVOLOG ) 100 UNIT/ML injection, Inject 0-15 Units into the skin 3 (three) times daily with meals. Sliding scale  CBG 70 - 120: 0 units: CBG 121 - 150: 2 units; CBG 151 - 200: 3 units; CBG 201 - 250: 5 units; CBG 251 - 300: 8 units;CBG 301 - 350: 11 units; CBG 351 - 400: 15 units; CBG > 400 : 15 units and notify MD (Patient not taking: Reported on 02/16/2024), Disp: , Rfl:    Lacosamide  150 MG TABS, Take 1 tablet (150 mg total) by mouth 2 (two) times daily., Disp: 10 tablet, Rfl: 0   levETIRAcetam  (KEPPRA ) 500 MG tablet, Take 1 tablet (500 mg total) by mouth 2 (two) times daily., Disp: 180 tablet, Rfl: 3   loperamide  (IMODIUM ) 2 MG capsule, Take 2 mg by mouth daily as needed for diarrhea or loose stools., Disp: ,  Rfl:    methocarbamol  (ROBAXIN ) 500 MG tablet, Take 500 mg by mouth every 8 (eight) hours as needed for muscle spasms., Disp: , Rfl:    metolazone  (ZAROXOLYN ) 2.5 MG tablet, Take 2.5 mg by mouth once a  week. Every Tuesday., Disp: , Rfl:    midodrine  (PROAMATINE ) 5 MG tablet, Take 1 tablet (5 mg total) by mouth 3 (three) times daily with meals., Disp: , Rfl:    omega-3 acid ethyl esters (LOVAZA ) 1 g capsule, Take 2 g by mouth 2 (two) times daily., Disp: , Rfl:    omeprazole (PRILOSEC) 20 MG capsule, Take 20 mg by mouth daily., Disp: , Rfl:    polyethylene glycol powder (GLYCOLAX /MIRALAX ) 17 GM/SCOOP powder, Mix in water as directed and take 1 capful (17 g) by mouth daily., Disp: 238 g, Rfl: 0   potassium chloride  SA (KLOR-CON  M20) 20 MEQ tablet, Take 1-2 tablets (20-40 mEq total) by mouth See admin instructions. Take 2 tablets in the morning and 1 tablet in the evening (Patient taking differently: Take 20-40 mEq by mouth See admin instructions. Take 40 meq in the morning and 20 meq in the evening.), Disp: , Rfl:    prazosin  (MINIPRESS ) 2 MG capsule, Take 2 mg by mouth at bedtime., Disp: , Rfl:    pregabalin  (LYRICA ) 150 MG capsule, Take 1 capsule (150 mg total) by mouth 3 (three) times daily., Disp: 15 capsule, Rfl: 0   rOPINIRole  (REQUIP ) 0.25 MG tablet, Take 1 tablet (0.25 mg total) by mouth 2 (two) times daily., Disp: , Rfl:    sennosides-docusate sodium  (SENOKOT-S) 8.6-50 MG tablet, Take 2 tablets by mouth in the morning and at bedtime., Disp: , Rfl:    spironolactone  (ALDACTONE ) 25 MG tablet, Take 12.5 mg by mouth daily., Disp: , Rfl:    tirzepatide  (MOUNJARO ) 15 MG/0.5ML Pen, INJECT 15 MG INTO THE SKIN ONCE A WEEK. (Patient taking differently: Inject 15 mg into the skin once a week. Mondays), Disp: 6 mL, Rfl: 0   torsemide  (DEMADEX ) 20 MG tablet, Take 1 tablet (20 mg total) by mouth 2 (two) times daily., Disp: , Rfl:    traZODone  (DESYREL ) 150 MG tablet, Take 1 tablet (150 mg total) by mouth at bedtime., Disp: 30 tablet, Rfl: 0   venlafaxine  XR (EFFEXOR -XR) 150 MG 24 hr capsule, Take 150 mg by mouth daily with breakfast., Disp: , Rfl:   Vitals   Vitals:   03/06/24 0000 03/06/24 0042  03/06/24 0044  BP:  (!) 107/53   Pulse:  84   Resp:  18   Temp:  97.8 F (36.6 C)   SpO2:  98%   Weight: (!) 151 kg  (!) 151 kg  Height:   6' 2 (1.88 m)    Body mass index is 42.74 kg/m.   Physical Exam   Constitutional: Appears well-developed and well-nourished.   Neurologic Examination    Neuro: Mental Status: Patient is somnolent but able to answer questions appropriately.  He is able to name objects and repeat.  No signs of aphasia or neglect. Cranial Nerves: II: Visual Fields are full. Pupils are equal, round, and reactive to light.   III,IV, VI: EOMI without ptosis or diploplia.  V: Facial sensation is symmetric to temperature VII: Facial movement with mild left facial weakness VIII: hearing is intact to voice X: Uvula elevates symmetrically XII: tongue is midline without atrophy or fasciculations.  Motor: He has an inconsistent exam, with giveaway weakness of the left arm and leg Sensory: Sensation is  reduced throughout the left side Cerebellar: Unable to perform finger-nose-finger on the left, intact on the right       Labs/Imaging/Neurodiagnostic studies   CBC:  Recent Labs  Lab March 27, 2024 0004 03-27-24 0014  WBC 8.7  --   NEUTROABS 5.5  --   HGB 13.7 13.9  HCT 41.8 41.0  MCV 87.4  --   PLT 271  --    Basic Metabolic Panel:  Lab Results  Component Value Date   NA 140 03-27-24   K 3.6 27-Mar-2024   CO2 27 01/29/2024   GLUCOSE 154 (H) 03/27/2024   BUN 10 2024/03/27   CREATININE 1.00 Mar 27, 2024   CALCIUM  9.3 01/29/2024   GFRNONAA >60 01/29/2024   GFRAA 77 05/20/2020   Lipid Panel:  Lab Results  Component Value Date   LDLCALC 107 (H) 12/17/2023   HgbA1c:  Lab Results  Component Value Date   HGBA1C 7.0 (H) 12/18/2023   Urine Drug Screen:     Component Value Date/Time   LABOPIA NONE DETECTED 07/14/2023 0528   COCAINSCRNUR NONE DETECTED 07/14/2023 0528   LABBENZ NONE DETECTED 07/14/2023 0528   AMPHETMU NONE DETECTED 07/14/2023  0528   THCU NONE DETECTED 07/14/2023 0528   LABBARB NONE DETECTED 07/14/2023 0528    Alcohol  Level     Component Value Date/Time   ETH <15 03-27-24 0012   INR  Lab Results  Component Value Date   INR 1.1 March 27, 2024   APTT  Lab Results  Component Value Date   APTT 29 March 27, 2024   AED levels:  Lab Results  Component Value Date   LEVETIRACETA 13.6 12/28/2023    CT Head without contrast(Personally reviewed): Negative  CT angio Head and Neck with contrast(Personally reviewed): Negative ASSESSMENT   THANH MOTTERN is a 68 y.o. male who awoke with left-sided weakness and numbness.  It is possible that he has had a previous stroke and the symptoms represent recrudescence with drowsiness/other physiological stressors.  Seizure could also be a possible etiology to his recurrent presentations, but without definite evidence of this I would not favor modifying his home antiepileptics.  With his inconsistent exam, conversion could be a consideration as well but this is by no means definite.  At the current time, given that he has been worked up for similar symptoms with negative workup repeatedly if MRI is negative I do not think I would favor further investigation of this at this time.  Of note, the patient presented with a MOST form indicating comfort only measures, but then indicates that he would want antibiotics and IV fluids if needed.  I clarified with the patient who asked me to talk to his significant other, and in my conversation with her I think that this form was filled out incorrectly.  He does want to be DNR/DNI but receive care up to that point.  I informed his wife to have this form corrected at his facility.  RECOMMENDATIONS  MRI brain VBG to investigate possible cause of drowsiness If these are negative, no further workup at this time. ______________________________________________________________________    Bonney Aisha Seals, MD [facility]

## 2024-03-06 NOTE — Discharge Instructions (Addendum)
 You were seen today for strokelike symptoms.  There is no evidence of stroke on your MRI.  Your workup is otherwise unremarkable.  Continue your blood thinners as directed as well as your seizure medications.  Please have your primary care NP/provider update your MOST form to indicate limited interventions.

## 2024-03-06 NOTE — ED Triage Notes (Signed)
 PT arrivewd via EMS from [facility], PT was called a code stroke 1 min prior to arrival at 2358. PT last known well was 2100 per nursing home staff, PT states he was having these problems the last 30 minutes so around 2330. PT alert and talking. PT has left sided weakness. See NIH and charting. PT was taken to CT per neuro.

## 2024-03-06 NOTE — ED Provider Notes (Signed)
 [place] EMERGENCY DEPARTMENT AT [facility] Provider Note   CSN: [id number] Arrival date & time: 03/06/24  0003  An emergency department physician performed an initial assessment on this suspected stroke patient at 0006.  Patient presents with: Code Stroke   Lawrence [name] is a 68 y.o. male.   HPI     This is a 68 year old male who presents as a code stroke.  Per EMS report, patient was last seen normal by staff at his facility at 2100.  Patient reports that approximately 20 minutes ago he developed speech difficulty and left-sided numbness with weakness.  EMS noted potentially a facial droop as well.  He is on Eliquis .  Unsure whether he took his Eliquis  tonight.  Level 5 caveat  Prior to Admission medications   Medication Sig Start Date End Date Taking? Authorizing Provider  acetaminophen  (TYLENOL ) 650 MG CR tablet Take 650 mg by mouth every 8 (eight) hours as needed for pain.    [provider]  albuterol  (VENTOLIN  HFA) 108 (90 Base) MCG/ACT inhaler Inhale 2 puffs into the lungs every 6 (six) hours as needed for wheezing or shortness of breath. 04/17/23   Hunsucker, Donnice SAUNDERS, MD  allopurinol  (ZYLOPRIM ) 100 MG tablet Take 1 tablet (100 mg total) by mouth daily. 07/25/23 07/24/24  Elgergawy, Brayton RAMAN, MD  apixaban  (ELIQUIS ) 5 MG TABS tablet Take 1 tablet (5 mg total) by mouth 2 (two) times daily. 04/17/23   Rolan Ezra RAMAN, MD  ARIPiprazole  (ABILIFY ) 5 MG tablet TAKE 1 TABLET BY MOUTH EVERYDAY AT BEDTIME 10/03/23   Mozingo, Regina Nattalie, NP  atorvastatin  (LIPITOR ) 80 MG tablet Take 1 tablet (80 mg total) by mouth at bedtime. 12/20/23   Cherlyn Labella, MD  bisacodyl  (DULCOLAX) 5 MG EC tablet Take 10 mg by mouth daily as needed for moderate constipation.    [provider]  busPIRone  (BUSPAR ) 5 MG tablet Take 1 tablet (5 mg total) by mouth 3 (three) times daily. 12/20/23   Akula, Vijaya, MD  cephALEXin  (KEFLEX ) 500 MG capsule Take 1 capsule (500 mg total) by  mouth 4 (four) times daily. Patient not taking: Reported on 02/16/2024 01/29/24   Sofia, Leslie K, PA-C  colchicine  0.6 MG tablet Take 1 tablet (0.6 mg total) by mouth daily. 07/12/23   Love, Sharlet RAMAN, PA-C  cyanocobalamin  (VITAMIN B12) 1000 MCG tablet Take 1,000 mcg by mouth daily.    [provider]  fenofibrate  (TRICOR ) 145 MG tablet Take 1 tablet (145 mg total) by mouth daily. 04/17/23   Rolan Ezra RAMAN, MD  ferrous sulfate  325 (65 FE) MG tablet Take 325 mg by mouth daily.    [provider]  fluticasone  (FLONASE ) 50 MCG/ACT nasal spray Place 2 sprays into both nostrils daily.     [provider]  guaiFENesin -dextromethorphan (ROBITUSSIN DM) 100-10 MG/5ML syrup Take 5 mLs by mouth every 4 (four) hours as needed for cough. 10/25/23   Rai, Ripudeep MARLA, MD  HUMALOG KWIKPEN 100 UNIT/ML KwikPen Inject 2-15 Units into the skin. 121-150 2 units 151-200 3 units  201-250 5 units  251-300 8 units  301-350 11 units  351-400 15 units 12/11/23   [provider]  HYDROcodone -acetaminophen  (NORCO/VICODIN) 5-325 MG tablet Take 1 tablet by mouth every 6 (six) hours as needed for severe pain (pain score 7-10). Patient not taking: Reported on 02/16/2024 12/08/23   Elgergawy, Brayton RAMAN, MD  INCRUSE ELLIPTA  62.5 MCG/ACT AEPB Inhale 1 puff into the lungs daily. 12/11/23   [provider]  insulin  aspart (NOVOLOG ) 100 UNIT/ML injection Inject 0-15 Units into the skin 3 (three) times daily with meals. Sliding scale  CBG 70 - 120: 0 units: CBG 121 - 150: 2 units; CBG 151 - 200: 3 units; CBG 201 - 250: 5 units; CBG 251 - 300: 8 units;CBG 301 - 350: 11 units; CBG 351 - 400: 15 units; CBG > 400 : 15 units and notify MD Patient not taking: Reported on 02/16/2024 10/25/23   Rai, Nydia POUR, MD  Lacosamide  150 MG TABS Take 1 tablet (150 mg total) by mouth 2 (two) times daily. 10/10/23   Briana Elgin LABOR, MD  levETIRAcetam  (KEPPRA ) 500 MG tablet Take 1 tablet (500 mg total) by mouth 2 (two) times  daily. 05/04/23   Gregg Lek, MD  loperamide  (IMODIUM ) 2 MG capsule Take 2 mg by mouth daily as needed for diarrhea or loose stools.    [provider]  methocarbamol  (ROBAXIN ) 500 MG tablet Take 500 mg by mouth every 8 (eight) hours as needed for muscle spasms.    [provider]  metolazone  (ZAROXOLYN ) 2.5 MG tablet Take 2.5 mg by mouth once a week. Every Tuesday. 09/20/23   [provider]  midodrine  (PROAMATINE ) 5 MG tablet Take 1 tablet (5 mg total) by mouth 3 (three) times daily with meals. 12/08/23   Elgergawy, Brayton RAMAN, MD  omega-3 acid ethyl esters (LOVAZA ) 1 g capsule Take 2 g by mouth 2 (two) times daily.    [provider]  omeprazole (PRILOSEC) 20 MG capsule Take 20 mg by mouth daily. 05/29/20   [provider]  polyethylene glycol powder (GLYCOLAX /MIRALAX ) 17 GM/SCOOP powder Mix in water as directed and take 1 capful (17 g) by mouth daily. 07/12/23   Love, Sharlet RAMAN, PA-C  potassium chloride  SA (KLOR-CON  M20) 20 MEQ tablet Take 1-2 tablets (20-40 mEq total) by mouth See admin instructions. Take 2 tablets in the morning and 1 tablet in the evening Patient taking differently: Take 20-40 mEq by mouth See admin instructions. Take 40 meq in the morning and 20 meq in the evening. 10/10/23   Briana Elgin LABOR, MD  prazosin  (MINIPRESS ) 2 MG capsule Take 2 mg by mouth at bedtime. 08/17/23   [provider]  pregabalin  (LYRICA ) 150 MG capsule Take 1 capsule (150 mg total) by mouth 3 (three) times daily. 10/10/23   Briana Elgin LABOR, MD  rOPINIRole  (REQUIP ) 0.25 MG tablet Take 1 tablet (0.25 mg total) by mouth 2 (two) times daily. 07/25/23   Elgergawy, Brayton RAMAN, MD  sennosides-docusate sodium  (SENOKOT-S) 8.6-50 MG tablet Take 2 tablets by mouth in the morning and at bedtime.    [provider]  spironolactone  (ALDACTONE ) 25 MG tablet Take 12.5 mg by mouth daily. 09/01/23   [provider]  tirzepatide  (MOUNJARO ) 15 MG/0.5ML Pen INJECT 15 MG  INTO THE SKIN ONCE A WEEK. Patient taking differently: Inject 15 mg into the skin once a week. Mondays 06/07/23   Dartha Ernst, MD  torsemide  (DEMADEX ) 20 MG tablet Take 1 tablet (20 mg total) by mouth 2 (two) times daily. 12/08/23   Elgergawy, Brayton RAMAN, MD  traZODone  (DESYREL ) 150 MG tablet Take 1 tablet (150 mg total) by mouth at bedtime. 12/20/23   Akula, Vijaya, MD  venlafaxine  XR (EFFEXOR -XR) 150 MG 24 hr capsule Take 150 mg by mouth daily with breakfast.    [provider]    Allergies: Niacin and related; Ppd [tuberculin purified protein derivative]; Tubersol [tuberculin, ppd];  Firvanq  [vancomycin ]; and Vibramycin  [doxycycline ]    Review of Systems  Constitutional:  Negative for fever.  Respiratory:  Negative for shortness of breath.   Cardiovascular:  Negative for chest pain.  Neurological:  Positive for speech difficulty, weakness and numbness.  All other systems reviewed and are negative.   Updated Vital Signs BP 137/74 (BP Location: Right Arm)   Pulse 79   Temp 97.6 F (36.4 C) (Oral)   Resp 18   Ht 1.88 m (6' 2)   Wt (!) 151 kg   SpO2 98%   BMI 42.74 kg/m   Physical Exam Vitals and nursing note reviewed.  Constitutional:      Appearance: He is well-developed. He is obese. He is not ill-appearing.  HENT:     Head: Normocephalic and atraumatic.     Mouth/Throat:     Mouth: Mucous membranes are moist.  Eyes:     Pupils: Pupils are equal, round, and reactive to light.  Cardiovascular:     Rate and Rhythm: Normal rate and regular rhythm.     Heart sounds: Normal heart sounds. No murmur heard. Pulmonary:     Effort: Pulmonary effort is normal. No respiratory distress.     Breath sounds: Normal breath sounds. No wheezing.  Abdominal:     General: Bowel sounds are normal.     Palpations: Abdomen is soft.     Tenderness: There is no abdominal tenderness. There is no rebound.  Musculoskeletal:     Cervical back: Neck supple.  Lymphadenopathy:      Cervical: No cervical adenopathy.  Skin:    General: Skin is warm and dry.  Neurological:     Mental Status: He is alert and oriented to person, place, and time.     Comments: No obvious facial droop, mild dysarthria, subjective asymmetry with light touch, inconsistent strength examination  Psychiatric:        Mood and Affect: Mood normal.     (all labs ordered are listed, but only abnormal results are displayed) Labs Reviewed  DIFFERENTIAL - Abnormal; Notable for the following components:      Result Value   Eosinophils Absolute 0.6 (*)    All other components within normal limits  COMPREHENSIVE METABOLIC PANEL WITH GFR - Abnormal; Notable for the following components:   Glucose, Bld 158 (*)    Calcium  8.7 (*)    Total Protein 6.2 (*)    Albumin 3.4 (*)    All other components within normal limits  RAPID URINE DRUG SCREEN, HOSP PERFORMED - Abnormal; Notable for the following components:   Opiates POSITIVE (*)    All other components within normal limits  URINALYSIS, ROUTINE W REFLEX MICROSCOPIC - Abnormal; Notable for the following components:   Leukocytes,Ua SMALL (*)    Bacteria, UA RARE (*)    All other components within normal limits  I-STAT CHEM 8, ED - Abnormal; Notable for the following components:   Glucose, Bld 154 (*)    All other components within normal limits  CBG MONITORING, ED - Abnormal; Notable for the following components:   Glucose-Capillary 157 (*)    All other components within normal limits  I-STAT VENOUS BLOOD GAS, ED - Abnormal; Notable for the following components:   Bicarbonate 28.1 (*)    Potassium 3.3 (*)    All other components within normal limits  ETHANOL  PROTIME-INR  APTT  CBC    EKG: None  Radiology: MR BRAIN WO CONTRAST Result Date: 03/06/2024 CLINICAL DATA:  Neuro  deficit, acute, stroke suspected left sided weakness EXAM: MRI HEAD WITHOUT CONTRAST TECHNIQUE: Multiplanar, multiecho pulse sequences of the brain and surrounding  structures were obtained without intravenous contrast. COMPARISON:  CT head 03/06/2024. FINDINGS: Mildly motion limited study. Brain: No acute infarction, hemorrhage, hydrocephalus, extra-axial collection or mass lesion. Vascular: Major arterial flow voids are maintained at the skull base. Skull and upper cervical spine: Normal marrow signal. Sinuses/Orbits: Mild-to-moderate paranasal sinus mucosal thickening. No acute orbital findings. Other: No mastoid effusions. IMPRESSION: No evidence of acute intracranial abnormality. Electronically Signed   By: Gilmore GORMAN Molt M.D.   On: 03/06/2024 01:51   CT ANGIO HEAD NECK W WO CM (CODE STROKE) Result Date: 03/06/2024 CLINICAL DATA:  Neuro deficit, acute, stroke suspected EXAM: CT ANGIOGRAPHY HEAD AND NECK WITH AND WITHOUT CONTRAST TECHNIQUE: Multidetector CT imaging of the head and neck was performed using the standard protocol during bolus administration of intravenous contrast. Multiplanar CT image reconstructions and MIPs were obtained to evaluate the vascular anatomy. Carotid stenosis measurements (when applicable) are obtained utilizing NASCET criteria, using the distal internal carotid diameter as the denominator. RADIATION DOSE REDUCTION: This exam was performed according to the departmental dose-optimization program which includes automated exposure control, adjustment of the mA and/or kV according to patient size and/or use of iterative reconstruction technique. CONTRAST:  75mL OMNIPAQUE  IOHEXOL  350 MG/ML SOLN COMPARISON:  None Available. FINDINGS: CTA NECK FINDINGS Aortic arch: Great vessel origins are patent without significant stenosis. Right carotid system: No evidence of dissection, stenosis (50% or greater), or occlusion. Left carotid system: No evidence of dissection, stenosis (50% or greater), or occlusion. Vertebral arteries: Left dominant. No evidence of dissection, stenosis (50% or greater), or occlusion. Skeleton: No acute abnormality on limited  assessment. Other neck: No acute abnormality on limited assessment. Upper chest: Lung apices are clear.  Trace pleural effusions. Review of the MIP images confirms the above findings CTA HEAD FINDINGS Anterior circulation: Bilateral intracranial ICAs, MCAs and ACAs are patent without proximal hemodynamically significant stenosis. Posterior circulation: Bilateral intradural vertebral arteries, basilar artery and bilateral posterior cerebral arteries are patent without proximal hemodynamically significant stenosis. Venous sinuses: As permitted by contrast timing, patent. Review of the MIP images confirms the above findings IMPRESSION: No emergent large vessel occlusion or proximal hemodynamically significant stenosis. Electronically Signed   By: Gilmore GORMAN Molt M.D.   On: 03/06/2024 00:34   CT HEAD CODE STROKE WO CONTRAST Result Date: 03/06/2024 CLINICAL DATA:  Code stroke.  Neuro deficit, acute, stroke suspected EXAM: CT HEAD WITHOUT CONTRAST TECHNIQUE: Contiguous axial images were obtained from the base of the skull through the vertex without intravenous contrast. RADIATION DOSE REDUCTION: This exam was performed according to the departmental dose-optimization program which includes automated exposure control, adjustment of the mA and/or kV according to patient size and/or use of iterative reconstruction technique. COMPARISON:  CT head Jan 06, 2024. FINDINGS: Brain: No evidence of acute infarction, hemorrhage, hydrocephalus, extra-axial collection or mass lesion/mass effect. Vascular: No hyperdense vessel. Skull: No acute fracture. Sinuses/Orbits: No acute finding. Other: No mastoid effusions. ASPECTS [facility] Stroke Program Early CT Score) Total score (0-10 with 10 being normal): 10. IMPRESSION: 1. No evidence of acute intracranial abnormality. 2. ASPECTS is 10. Code stroke imaging results were communicated on 03/06/2024 at 12:26 am to provider Dr. Michaela via secure text paging. Electronically Signed   By:  Gilmore GORMAN Molt M.D.   On: 03/06/2024 00:26   VAS US  LOWER EXTREMITY VENOUS REFLUX Result Date: 03/04/2024  Lower Venous Reflux Study Patient Name:  [name]  Date of Exam:   03/04/2024 Medical Rec #: [id number]         Accession #:    [id number] Date of Birth: [birth year]         Patient Gender: M Patient Age:   46 years Exam Location:  [address] Procedure:      VAS US  LOWER EXTREMITY VENOUS REFLUX Referring Phys: GAILE NEW --------------------------------------------------------------------------------  Indications: Chronic venous insufficiency.  Risk Factors: Immobility obesity. Limitations: Body habitus. Comparison Study: 10/19/22 Performing Technologist: Garnette Rockers  Examination Guidelines: A complete evaluation includes B-mode imaging, spectral Doppler, color Doppler, and power Doppler as needed of all accessible portions of each vessel. Bilateral testing is considered an integral part of a complete examination. Limited examinations for reoccurring indications may be performed as noted. The reflux portion of the exam is performed with the patient in reverse Trendelenburg. Significant venous reflux is defined as >500 ms in the superficial venous system, and >1 second in the deep venous system.  Venous Reflux Times +--------------+---------+------+-----------+------------+--------+ RIGHT         Reflux NoRefluxReflux TimeDiameter cmsComments                         Yes                                  +--------------+---------+------+-----------+------------+--------+ CFV           no                                             +--------------+---------+------+-----------+------------+--------+ FV mid        no                                             +--------------+---------+------+-----------+------------+--------+ Popliteal     no                                             +--------------+---------+------+-----------+------------+--------+ GSV at  SFJ              yes    >500 ms      0.86             +--------------+---------+------+-----------+------------+--------+ GSV prox thigh          yes    >500 ms      0.7              +--------------+---------+------+-----------+------------+--------+ GSV mid thigh                                       NWV      +--------------+---------+------+-----------+------------+--------+ GSV dist thigh                                      NWV      +--------------+---------+------+-----------+------------+--------+ GSV at knee  NWV      +--------------+---------+------+-----------+------------+--------+ GSV prox calf                                       NWV      +--------------+---------+------+-----------+------------+--------+ GSV mid calf  no                            0.42             +--------------+---------+------+-----------+------------+--------+ GSV dist calf no                            0.4              +--------------+---------+------+-----------+------------+--------+ SSV at [facility]              yes    >500 ms      0.51             +--------------+---------+------+-----------+------------+--------+ SSV prox calf           yes    >500 ms      0.37             +--------------+---------+------+-----------+------------+--------+  +--------------+---------+------+-----------+------------+--------+ LEFT          Reflux NoRefluxReflux TimeDiameter cmsComments                         Yes                                  +--------------+---------+------+-----------+------------+--------+ CFV           no                                             +--------------+---------+------+-----------+------------+--------+ FV mid        no                                             +--------------+---------+------+-----------+------------+--------+ Popliteal     no                                              +--------------+---------+------+-----------+------------+--------+ GSV at [facility]    no                            0.85             +--------------+---------+------+-----------+------------+--------+ GSV prox thigh          yes    >500 ms      0.53             +--------------+---------+------+-----------+------------+--------+ GSV mid thigh                                       NWV      +--------------+---------+------+-----------+------------+--------+ GSV dist  thigh          yes    >500 ms      0.27             +--------------+---------+------+-----------+------------+--------+ GSV at knee   no                            0.24             +--------------+---------+------+-----------+------------+--------+ GSV prox calf           yes    >500 ms      0.41             +--------------+---------+------+-----------+------------+--------+ GSV mid calf                                        NWV      +--------------+---------+------+-----------+------------+--------+ GSV dist calf no                            0.3              +--------------+---------+------+-----------+------------+--------+ SSV at [facility]    no                            0.64             +--------------+---------+------+-----------+------------+--------+ SSV prox calf no                            0.46             +--------------+---------+------+-----------+------------+--------+   Summary: Bilateral: - No evidence of deep vein thrombosis seen in the lower extremities, bilaterally, from the common femoral through the popliteal veins. - No evidence of superficial venous thrombosis in the lower extremities, bilaterally.  Right: - Venous reflux is noted in the right sapheno-femoral junction. - Venous reflux is noted in the right greater saphenous vein in the thigh. - Venous reflux is noted in the right short saphenous vein.  Left: - Venous reflux is noted in the left greater  saphenous vein in the thigh. - Venous reflux is noted in the left greater saphenous vein in the calf.  *See table(s) above for measurements and observations. Electronically signed by Fonda Rim on 03/04/2024 at 6:33:47 PM.    Final      Procedures   Medications Ordered in the ED  iohexol  (OMNIPAQUE ) 350 MG/ML injection 75 mL (75 mLs Intravenous Contrast Given 03/06/24 0017)                                    Medical Decision Making Amount and/or Complexity of Data Reviewed Labs: ordered. Radiology: ordered.   This patient presents to the ED for concern of CVA, this involves an extensive number of treatment options, and is a complaint that carries with it a high risk of complications and morbidity.  I considered the following differential and admission for this acute, potentially life threatening condition.  The differential diagnosis includes CVA, TIA, seizure, metabolic encephalopathy, UTI  MDM:    This is a 68 year old male who presents with concerns for strokelike symptoms.  He is nontoxic.  Slightly drowsy.  Exam is inconsistent.  Does report some neurologic complaints on the left.  No obvious droop or dysarthria.  Neurology is at the bedside.  CT and CTA are reassuring.  History of the same.  He is already on blood thinners.  He also takes seizure medications.  Recommend MRI and other workup to rule out secondary causes.  MRI does not show any evidence of acute stroke.  VBG and infectious workup is reassuring.  No significant metabolic derangements.  Of note, patient's MOST form indicates comfort measures but it was confirmed with the patient's significant other that he does not fact want antibiotics and IV fluids.  He is DNR/DNI.  (Labs, imaging, consults)  Labs: I Ordered, and personally interpreted labs.  The pertinent results include: Stroke panel  Imaging Studies ordered: I ordered imaging studies including CT, CTA, MRI I independently visualized and interpreted imaging. I  agree with the radiologist interpretation  Additional history obtained from significant other and chart review.  External records from outside source obtained and reviewed including prior evaluations  Cardiac Monitoring: The patient was maintained on a cardiac monitor.  If on the cardiac monitor, I personally viewed and interpreted the cardiac monitored which showed an underlying rhythm of: Sinus  Reevaluation: After the interventions noted above, I reevaluated the patient and found that they have :improved  Social Determinants of Health:  lives in facility  Disposition: Discharged  Co morbidities that complicate the patient evaluation  Past Medical History:  Diagnosis Date   Acquired dilation of ascending aorta and aortic root (HCC)    40mm by echo 01/2021   Adenomatous colon polyp 2007   Anemia    Anxiety    Asthma    BPH without obstruction/lower urinary tract symptoms 02/22/2017   Chronic diastolic (congestive) heart failure (HCC)    Chronic venous stasis 03/07/2019   COPD (chronic obstructive pulmonary disease) (HCC)    Coronary artery calcification seen on CAT scan    Depression    Diabetic neuropathy (HCC) 09/11/2019   History of colon polyps 08/24/2018   Hypertension    Morbid obesity (HCC)    OSA (obstructive sleep apnea)    Pain due to onychomycosis of toenails of both feet 09/11/2019   Peripheral neuropathy 02/22/2017   Primary osteoarthritis, left shoulder 03/05/2017   PTSD (post-traumatic stress disorder)    Pure hypercholesterolemia    QT prolongation 03/07/2019   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 Wendel and Dr. Lucas   Seizures [facility])    Severe aortic stenosis    Sinus tachycardia 03/07/2019   Sleep apnea    CPAP   Type 2 diabetes mellitus with vascular disease (HCC) 09/11/2019     Medicines Meds ordered this encounter  Medications   iohexol  (OMNIPAQUE ) 350 MG/ML injection 75 mL     I have reviewed the patients home medicines and have made adjustments as needed  Problem List / ED Course: Problem List Items Addressed This Visit       Other   Stroke-like symptoms - Primary             Final diagnoses:  Stroke-like symptoms    ED Discharge Orders     None          Bari Charmaine FALCON, MD 03/06/24 [phone]

## 2024-03-07 ENCOUNTER — Other Ambulatory Visit (HOSPITAL_COMMUNITY)

## 2024-03-07 ENCOUNTER — Ambulatory Visit: Attending: Physician Assistant | Admitting: Physician Assistant

## 2024-03-07 ENCOUNTER — Ambulatory Visit: Admitting: Professional Counselor

## 2024-03-07 ENCOUNTER — Ambulatory Visit: Payer: Self-pay | Admitting: Physician Assistant

## 2024-03-07 ENCOUNTER — Ambulatory Visit (HOSPITAL_COMMUNITY)
Admission: RE | Admit: 2024-03-07 | Discharge: 2024-03-07 | Disposition: A | Discharge status: Home / Self Care | Source: Ambulatory Visit | Attending: Cardiology | Admitting: Cardiology

## 2024-03-07 ENCOUNTER — Ambulatory Visit: Admitting: Physician Assistant

## 2024-03-07 VITALS — BP 118/76 | HR 78 | Ht 74.0 in | Wt 332.0 lb

## 2024-03-07 DIAGNOSIS — I5032 Chronic diastolic (congestive) heart failure: Secondary | ICD-10-CM

## 2024-03-07 DIAGNOSIS — Z952 Presence of prosthetic heart valve: Secondary | ICD-10-CM

## 2024-03-07 DIAGNOSIS — I480 Paroxysmal atrial fibrillation: Secondary | ICD-10-CM

## 2024-03-07 DIAGNOSIS — G4733 Obstructive sleep apnea (adult) (pediatric): Secondary | ICD-10-CM

## 2024-03-07 DIAGNOSIS — I447 Left bundle-branch block, unspecified: Secondary | ICD-10-CM

## 2024-03-07 DIAGNOSIS — Z8673 Personal history of transient ischemic attack (TIA), and cerebral infarction without residual deficits: Secondary | ICD-10-CM

## 2024-03-07 LAB — ECHOCARDIOGRAM COMPLETE
AV Mean grad: 13.4 mmHg
AV Peak grad: 23.9 mmHg
Ao pk vel: 2.45 m/s
Area-P 1/2: 2.91 cm2
S' Lateral: 3.8 cm

## 2024-03-07 NOTE — Patient Instructions (Signed)
 Medication Instructions:  Your physician recommends that you continue on your current medications as directed. Please refer to the Current Medication list given to you today.  *If you need a refill on your cardiac medications before your next appointment, please call your pharmacy*  Lab Work: NONE NEEDED If you have labs (blood work) drawn today and your tests are completely normal, you will receive your results only by: MyChart Message (if you have MyChart) OR A paper copy in the mail If you have any lab test that is abnormal or we need to change your treatment, we will call you to review the results.  Testing/Procedures: NONE NEEDED  Follow-Up: At [facility], you and your health needs are our priority.  As part of our continuing mission to provide you with exceptional heart care, our providers are all part of one team.  This team includes your primary Cardiologist (physician) and Advanced Practice Providers or APPs (Physician Assistants and Nurse Practitioners) who all work together to provide you with the care you need, when you need it.  Your next appointment:   6 month(s)  Provider:   [facility]  We recommend signing up for the patient portal called MyChart.  Sign up information is provided on this After Visit Summary.  MyChart is used to connect with patients for Virtual Visits (Telemedicine).  Patients are able to view lab/test results, encounter notes, upcoming appointments, etc.  Non-urgent messages can be sent to your provider as well.   To learn more about what you can do with MyChart, go to [email or web address].

## 2024-03-08 DIAGNOSIS — R239 Unspecified skin changes: Secondary | ICD-10-CM | POA: Diagnosis not present

## 2024-03-08 DIAGNOSIS — I639 Cerebral infarction, unspecified: Secondary | ICD-10-CM | POA: Diagnosis not present

## 2024-03-08 DIAGNOSIS — M6281 Muscle weakness (generalized): Secondary | ICD-10-CM | POA: Diagnosis not present

## 2024-03-08 DIAGNOSIS — R278 Other lack of coordination: Secondary | ICD-10-CM | POA: Diagnosis not present

## 2024-03-08 DIAGNOSIS — E1169 Type 2 diabetes mellitus with other specified complication: Secondary | ICD-10-CM | POA: Diagnosis not present

## 2024-03-08 DIAGNOSIS — I4891 Unspecified atrial fibrillation: Secondary | ICD-10-CM | POA: Diagnosis not present

## 2024-03-08 DIAGNOSIS — I739 Peripheral vascular disease, unspecified: Secondary | ICD-10-CM | POA: Diagnosis not present

## 2024-03-08 DIAGNOSIS — G8194 Hemiplegia, unspecified affecting left nondominant side: Secondary | ICD-10-CM | POA: Diagnosis not present

## 2024-03-08 DIAGNOSIS — L89890 Pressure ulcer of other site, unstageable: Secondary | ICD-10-CM | POA: Diagnosis not present

## 2024-03-12 DIAGNOSIS — I639 Cerebral infarction, unspecified: Secondary | ICD-10-CM | POA: Diagnosis not present

## 2024-03-12 DIAGNOSIS — I482 Chronic atrial fibrillation, unspecified: Secondary | ICD-10-CM | POA: Diagnosis not present

## 2024-03-12 DIAGNOSIS — I5033 Acute on chronic diastolic (congestive) heart failure: Secondary | ICD-10-CM | POA: Diagnosis not present

## 2024-03-12 DIAGNOSIS — J449 Chronic obstructive pulmonary disease, unspecified: Secondary | ICD-10-CM | POA: Diagnosis not present

## 2024-03-13 DIAGNOSIS — N1831 Chronic kidney disease, stage 3a: Secondary | ICD-10-CM | POA: Diagnosis not present

## 2024-03-13 DIAGNOSIS — I639 Cerebral infarction, unspecified: Secondary | ICD-10-CM | POA: Diagnosis not present

## 2024-03-13 DIAGNOSIS — E119 Type 2 diabetes mellitus without complications: Secondary | ICD-10-CM | POA: Diagnosis not present

## 2024-03-14 DIAGNOSIS — J449 Chronic obstructive pulmonary disease, unspecified: Secondary | ICD-10-CM | POA: Diagnosis not present

## 2024-03-14 DIAGNOSIS — M6281 Muscle weakness (generalized): Secondary | ICD-10-CM | POA: Diagnosis not present

## 2024-03-14 DIAGNOSIS — I69811 Memory deficit following other cerebrovascular disease: Secondary | ICD-10-CM | POA: Diagnosis not present

## 2024-03-14 DIAGNOSIS — I5030 Unspecified diastolic (congestive) heart failure: Secondary | ICD-10-CM | POA: Diagnosis not present

## 2024-03-14 DIAGNOSIS — I482 Chronic atrial fibrillation, unspecified: Secondary | ICD-10-CM | POA: Diagnosis not present

## 2024-03-14 DIAGNOSIS — I5033 Acute on chronic diastolic (congestive) heart failure: Secondary | ICD-10-CM | POA: Diagnosis not present

## 2024-03-14 DIAGNOSIS — I63542 Cerebral infarction due to unspecified occlusion or stenosis of left cerebellar artery: Secondary | ICD-10-CM | POA: Diagnosis not present

## 2024-03-14 DIAGNOSIS — I639 Cerebral infarction, unspecified: Secondary | ICD-10-CM | POA: Diagnosis not present

## 2024-03-14 DIAGNOSIS — R262 Difficulty in walking, not elsewhere classified: Secondary | ICD-10-CM | POA: Diagnosis not present

## 2024-03-15 ENCOUNTER — Encounter: Payer: Self-pay | Admitting: Adult Health

## 2024-03-15 ENCOUNTER — Telehealth: Admitting: Adult Health

## 2024-03-15 DIAGNOSIS — F411 Generalized anxiety disorder: Secondary | ICD-10-CM | POA: Diagnosis not present

## 2024-03-15 DIAGNOSIS — F331 Major depressive disorder, recurrent, moderate: Secondary | ICD-10-CM | POA: Diagnosis not present

## 2024-03-15 DIAGNOSIS — I5033 Acute on chronic diastolic (congestive) heart failure: Secondary | ICD-10-CM | POA: Diagnosis not present

## 2024-03-15 DIAGNOSIS — F410 Panic disorder [episodic paroxysmal anxiety] without agoraphobia: Secondary | ICD-10-CM | POA: Diagnosis not present

## 2024-03-15 DIAGNOSIS — G4700 Insomnia, unspecified: Secondary | ICD-10-CM

## 2024-03-15 DIAGNOSIS — E119 Type 2 diabetes mellitus without complications: Secondary | ICD-10-CM | POA: Diagnosis not present

## 2024-03-15 DIAGNOSIS — F4310 Post-traumatic stress disorder, unspecified: Secondary | ICD-10-CM

## 2024-03-15 DIAGNOSIS — I639 Cerebral infarction, unspecified: Secondary | ICD-10-CM | POA: Diagnosis not present

## 2024-03-15 DIAGNOSIS — I5040 Unspecified combined systolic (congestive) and diastolic (congestive) heart failure: Secondary | ICD-10-CM | POA: Diagnosis not present

## 2024-03-15 NOTE — Progress Notes (Signed)
 Lawrence [name] [id number] Jan 25, 1956 68 y.o.  Virtual Visit via Video Note  I connected with pt @ on 03/15/24 at  9:00 AM EDT by a video enabled telemedicine application and verified that I am speaking with the correct person using two identifiers.   I discussed the limitations of evaluation and management by telemedicine and the availability of in person appointments. The patient expressed understanding and agreed to proceed.  I discussed the assessment and treatment plan with the patient. The patient was provided an opportunity to ask questions and all were answered. The patient agreed with the plan and demonstrated an understanding of the instructions.   The patient was advised to call back or seek an in-person evaluation if the symptoms worsen or if the condition fails to improve as anticipated.  I provided 25 minutes of non-face-to-face time during this encounter.  The patient was located at home.  The provider was located at [facility].   Angeline LOISE Sayers, NP   Subjective:   Patient ID:  Lawrence [name] is a 68 y.o. (DOB [birth year]) male.  Chief Complaint: No chief complaint on file.   HPI DERIC BOCOCK presents for follow-up of panic attacks, MDD, GAD, PTSD and insomnia.  Previously followed by [facility].  Reports 7 minor strokes  Describes mood today as getting better. Pleasant. Flat. Reports tearfulness. Mood symptoms - reports some depression, anxiety and irritability - it's not going to go away and I have to deal with it. Reports improved interest and activities. Reports decreased panic attacks - 2 since being at facility. Reports some over thinking. Reports some worry and rumination. Reports mood is stable - stable as it's going to get. Feels like current medications are helpful. He and partner doing well - partner living at their apartment. Taking medications as prescribed.  Energy levels lower. Active, planning to restart P/T.  Enjoys some usual  interests and activities. Has a significant other.  Living at [facility] in [place]. No family local.  Appetite adequate. Weight loss 335 from 360 pounds. Sleeps better some nights than others. Averages 7 hours a night and naps a an hour during the day.  Reports focus and concentration has improved. Completing tasks. Is now staying in a LTC facility. Retired [employer]. Disabled. Denies SI or HI.  Denies AH or VH. Denies self harm. Denies substance use.  Previous medication trials: Ambien   Review of Systems:  Review of Systems  Musculoskeletal:  Negative for gait problem.  Neurological:  Negative for tremors.  Psychiatric/Behavioral:         Please refer to HPI    Medications: I have reviewed the patient's current medications.  Current Outpatient Medications  Medication Sig Dispense Refill   acetaminophen  (TYLENOL ) 650 MG CR tablet Take 650 mg by mouth every 8 (eight) hours as needed for pain.     albuterol  (VENTOLIN  HFA) 108 (90 Base) MCG/ACT inhaler Inhale 2 puffs into the lungs every 6 (six) hours as needed for wheezing or shortness of breath. 1 each 6   allopurinol  (ZYLOPRIM ) 100 MG tablet Take 1 tablet (100 mg total) by mouth daily.     apixaban  (ELIQUIS ) 5 MG TABS tablet Take 1 tablet (5 mg total) by mouth 2 (two) times daily. 180 tablet 3   ARIPiprazole  (ABILIFY ) 5 MG tablet TAKE 1 TABLET BY MOUTH EVERYDAY AT BEDTIME 60 tablet 2   atorvastatin  (LIPITOR ) 80 MG tablet Take 1 tablet (80 mg total) by mouth at bedtime. 30 tablet 3  bisacodyl  (DULCOLAX) 5 MG EC tablet Take 10 mg by mouth daily as needed for moderate constipation.     busPIRone  (BUSPAR ) 5 MG tablet Take 1 tablet (5 mg total) by mouth 3 (three) times daily. 90 tablet 1   cephALEXin  (KEFLEX ) 500 MG capsule Take 1 capsule (500 mg total) by mouth 4 (four) times daily. 28 capsule 0   colchicine  0.6 MG tablet Take 1 tablet (0.6 mg total) by mouth daily. 30 tablet 0   cyanocobalamin  (VITAMIN B12) 1000 MCG tablet Take  1,000 mcg by mouth daily.     fenofibrate  (TRICOR ) 145 MG tablet Take 1 tablet (145 mg total) by mouth daily. 90 tablet 3   ferrous sulfate  325 (65 FE) MG tablet Take 325 mg by mouth daily.     fluticasone  (FLONASE ) 50 MCG/ACT nasal spray Place 2 sprays into both nostrils daily.      guaiFENesin -dextromethorphan (ROBITUSSIN DM) 100-10 MG/5ML syrup Take 5 mLs by mouth every 4 (four) hours as needed for cough. (Patient not taking: Reported on 03/07/2024)     HUMALOG KWIKPEN 100 UNIT/ML KwikPen Inject 2-15 Units into the skin. 121-150 2 units 151-200 3 units  201-250 5 units  251-300 8 units  301-350 11 units  351-400 15 units     HYDROcodone -acetaminophen  (NORCO/VICODIN) 5-325 MG tablet Take 1 tablet by mouth every 6 (six) hours as needed for severe pain (pain score 7-10). 10 tablet 0   INCRUSE ELLIPTA  62.5 MCG/ACT AEPB Inhale 1 puff into the lungs daily.     insulin  aspart (NOVOLOG ) 100 UNIT/ML injection Inject 0-15 Units into the skin 3 (three) times daily with meals. Sliding scale  CBG 70 - 120: 0 units: CBG 121 - 150: 2 units; CBG 151 - 200: 3 units; CBG 201 - 250: 5 units; CBG 251 - 300: 8 units;CBG 301 - 350: 11 units; CBG 351 - 400: 15 units; CBG > 400 : 15 units and notify MD     Lacosamide  150 MG TABS Take 1 tablet (150 mg total) by mouth 2 (two) times daily. 10 tablet 0   levETIRAcetam  (KEPPRA ) 500 MG tablet Take 1 tablet (500 mg total) by mouth 2 (two) times daily. 180 tablet 3   loperamide  (IMODIUM ) 2 MG capsule Take 2 mg by mouth daily as needed for diarrhea or loose stools.     methocarbamol  (ROBAXIN ) 500 MG tablet Take 500 mg by mouth every 8 (eight) hours as needed for muscle spasms.     metolazone  (ZAROXOLYN ) 2.5 MG tablet Take 2.5 mg by mouth once a week. Every Tuesday.     midodrine  (PROAMATINE ) 5 MG tablet Take 1 tablet (5 mg total) by mouth 3 (three) times daily with meals.     omega-3 acid ethyl esters (LOVAZA ) 1 g capsule Take 2 g by mouth 2 (two) times daily.     omeprazole  (PRILOSEC) 20 MG capsule Take 20 mg by mouth daily.     polyethylene glycol powder (GLYCOLAX /MIRALAX ) 17 GM/SCOOP powder Mix in water as directed and take 1 capful (17 g) by mouth daily. 238 g 0   potassium chloride  SA (KLOR-CON  M20) 20 MEQ tablet Take 1-2 tablets (20-40 mEq total) by mouth See admin instructions. Take 2 tablets in the morning and 1 tablet in the evening (Patient taking differently: Take 20-40 mEq by mouth See admin instructions. Take 40 meq in the morning and 20 meq in the evening.)     prazosin  (MINIPRESS ) 2 MG capsule Take 2 mg by mouth at  bedtime.     pregabalin  (LYRICA ) 150 MG capsule Take 1 capsule (150 mg total) by mouth 3 (three) times daily. 15 capsule 0   rOPINIRole  (REQUIP ) 0.25 MG tablet Take 1 tablet (0.25 mg total) by mouth 2 (two) times daily.     sennosides-docusate sodium  (SENOKOT-S) 8.6-50 MG tablet Take 2 tablets by mouth in the morning and at bedtime.     spironolactone  (ALDACTONE ) 25 MG tablet Take 12.5 mg by mouth daily.     tirzepatide  (MOUNJARO ) 15 MG/0.5ML Pen INJECT 15 MG INTO THE SKIN ONCE A WEEK. (Patient taking differently: Inject 15 mg into the skin once a week. Mondays) 6 mL 0   torsemide  (DEMADEX ) 20 MG tablet Take 1 tablet (20 mg total) by mouth 2 (two) times daily.     traZODone  (DESYREL ) 150 MG tablet Take 1 tablet (150 mg total) by mouth at bedtime. 30 tablet 0   venlafaxine  XR (EFFEXOR -XR) 150 MG 24 hr capsule Take 150 mg by mouth daily with breakfast.     No current facility-administered medications for this visit.    Medication Side Effects: None  Allergies:  Allergies  Allergen Reactions   Niacin And Related Other (See Comments)    Redness, flushing   Ppd [Tuberculin Purified Protein Derivative]     Unknown rxn (MAR)   Tubersol [Tuberculin, Ppd] Other (See Comments)    Unknown rxn (MAR)   Firvanq  [Vancomycin ] Other (See Comments)    Red man syndrome   Vibramycin  [Doxycycline ] Rash    Past Medical History:  Diagnosis Date    Acquired dilation of ascending aorta and aortic root (HCC)    40mm by echo 01/2021   Adenomatous colon polyp 2007   Anemia    Anxiety    Asthma    BPH without obstruction/lower urinary tract symptoms 02/22/2017   Chronic diastolic (congestive) heart failure (HCC)    Chronic venous stasis 03/07/2019   COPD (chronic obstructive pulmonary disease) (HCC)    Coronary artery calcification seen on CAT scan    Depression    Diabetic neuropathy (HCC) 09/11/2019   History of colon polyps 08/24/2018   Hypertension    Morbid obesity (HCC)    OSA (obstructive sleep apnea)    Pain due to onychomycosis of toenails of both feet 09/11/2019   Peripheral neuropathy 02/22/2017   Primary osteoarthritis, left shoulder 03/05/2017   PTSD (post-traumatic stress disorder)    Pure hypercholesterolemia    QT prolongation 03/07/2019   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 Wendel and Dr. Lucas   Seizures [facility])    Severe aortic stenosis    Sinus tachycardia 03/07/2019   Sleep apnea    CPAP   Type 2 diabetes mellitus with vascular disease (HCC) 09/11/2019    Family History  Problem Relation Age of Onset   CAD Maternal Grandfather    Diabetes Other    Diabetes Mellitus II Neg Hx    Colon cancer Neg Hx    Esophageal cancer Neg Hx    Inflammatory bowel disease Neg Hx    Liver disease Neg Hx    Pancreatic cancer Neg Hx    Rectal cancer Neg Hx    Stomach cancer Neg Hx    Sleep apnea Neg Hx     Social History   Socioeconomic History   Marital status: Significant Other    Spouse name: Clarita Powers   Number of children: 1   Years of education: Not  on file   Highest education level: Associate degree: academic program  Occupational History   Occupation: retired    Comment: Building control surveyor  Tobacco Use   Smoking status: Former    Current packs/day: 0.00    Average packs/day: 1 pack/day for 44.0 years (44.0 ttl pk-yrs)    Types:  Cigarettes    Start date: 03/07/1972    Quit date: 03/07/2016    Years since quitting: 8.0    Passive exposure: Past   Smokeless tobacco: Never  Vaping Use   Vaping status: Never Used  Substance and Sexual Activity   Alcohol  use: Yes    Comment: rare   Drug use: No   Sexual activity: Not Currently  Other Topics Concern   Not on file  Social History Narrative   [facility]   Right Handed   Drinks 1-2 cups caffeine   Social Drivers of Health   Financial Resource Strain: Medium Risk (12/06/2021)   Received from [facility]   Overall Financial Resource Strain (CARDIA)    Difficulty of Paying Living Expenses: Somewhat hard  Food Insecurity: No Food Insecurity (12/15/2023)   Hunger Vital Sign    Worried About Running Out of Food in the Last Year: Never true    Ran Out of Food in the Last Year: Never true  Transportation Needs: No Transportation Needs (12/15/2023)   PRAPARE - Administrator, Civil Service (Medical): No    Lack of Transportation (Non-Medical): No  Recent Concern: Transportation Needs - Unmet Transportation Needs (10/03/2023)   PRAPARE - Administrator, Civil Service (Medical): Yes    Lack of Transportation (Non-Medical): No  Physical Activity: Unknown (12/06/2021)   Received from [facility]   Exercise Vital Sign    On average, how many days per week do you engage in moderate to strenuous exercise (like a brisk walk)?: Patient declined    On average, how many minutes do you engage in exercise at this level?: 10 min  Stress: No Stress Concern Present (01/21/2022)   Received from [facility] - Occupational Stress Questionnaire    Feeling of Stress : Only a little  Recent Concern: Stress - Stress Concern Present (11/19/2021)   Received from [facility] - Occupational Stress Questionnaire    Feeling of Stress :  To some extent  Social Connections: Socially Isolated (12/15/2023)   Social Connection and Isolation Panel    Frequency of Communication with Friends and Family: More than three times a week    Frequency of Social Gatherings with Friends and Family: Once a week    Attends Religious Services: Never    Database administrator or Organizations: No    Attends Banker Meetings: Never    Marital Status: Divorced  Catering manager Violence: Not At Risk (12/15/2023)   Humiliation, Afraid, Rape, and Kick questionnaire    Fear of Current or Ex-Partner: No    Emotionally Abused: No    Physically Abused: No    Sexually Abused: No    Past Medical History, Surgical history, Social history, and Family history were reviewed and updated as appropriate.   Please see review of systems for further details on the patient's review from today.   Objective:   Physical Exam:  There were no vitals taken for this visit.  Physical Exam Constitutional:  General: He is not in acute distress. Musculoskeletal:        General: No deformity.  Neurological:     Mental Status: He is alert and oriented to person, place, and time.     Coordination: Coordination normal.  Psychiatric:        Attention and Perception: Attention and perception normal. He does not perceive auditory or visual hallucinations.        Mood and Affect: Mood normal. Mood is not anxious or depressed. Affect is not labile, blunt, angry or inappropriate.        Speech: Speech normal.        Behavior: Behavior normal.        Thought Content: Thought content normal. Thought content is not paranoid or delusional. Thought content does not include homicidal or suicidal ideation. Thought content does not include homicidal or suicidal plan.        Cognition and Memory: Cognition and memory normal.        Judgment: Judgment normal.     Comments: Insight intact     Lab Review:     Component Value Date/Time   NA 141 03/06/2024 0225    NA 136 02/15/2023 1415   K 3.3 (L) 03/06/2024 0225   CL 101 03/06/2024 0014   CO2 25 03/06/2024 0004   GLUCOSE 154 (H) 03/06/2024 0014   BUN 10 03/06/2024 0014   BUN 37 (H) 02/15/2023 1415   CREATININE 1.00 03/06/2024 0014   CALCIUM  8.7 (L) 03/06/2024 0004   PROT 6.2 (L) 03/06/2024 0004   ALBUMIN 3.4 (L) 03/06/2024 0004   AST 21 03/06/2024 0004   ALT 28 03/06/2024 0004   ALKPHOS 50 03/06/2024 0004   BILITOT 0.5 03/06/2024 0004   GFRNONAA >60 03/06/2024 0004   GFRAA 77 05/20/2020 1038       Component Value Date/Time   WBC 8.7 03/06/2024 0004   RBC 4.78 03/06/2024 0004   HGB 13.3 03/06/2024 0225   HCT 39.0 03/06/2024 0225   PLT 271 03/06/2024 0004   MCV 87.4 03/06/2024 0004   MCH 28.7 03/06/2024 0004   MCHC 32.8 03/06/2024 0004   RDW 13.2 03/06/2024 0004   LYMPHSABS 1.8 03/06/2024 0004   MONOABS 0.7 03/06/2024 0004   EOSABS 0.6 (H) 03/06/2024 0004   BASOSABS 0.1 03/06/2024 0004    No results found for: POCLITH, LITHIUM   No results found for: PHENYTOIN, PHENOBARB, VALPROATE, CBMZ   .res Assessment: Plan:    Plan:  PDMP reviewed  Currently in LTC facility - he is unsure about current medication regimen, but will ask staff to provide a current medication list.  Previous medications: Prazosin  2mg  - 2 tabs at hs Abilify  5mg  daily Effexor  XR 75mg  - 3 every morning Buspar  5mg  TID  Mirtazapine  15mg  at hs Trazadone 200mg  at hs  RTC 6 months  25 minutes spent dedicated to the care of this patient on the date of this encounter to include pre-visit review of records, ordering of medication, post visit documentation, and face-to-face time with the patient discussing panic attacks, MDD, GAD, PTSD, and insomnia. Discussed continuing current medication regimen.  Patient advised to contact office with any questions, adverse effects, or acute worsening in signs and symptoms.  Discussed potential metabolic side effects associated with atypical antipsychotics,  as well as potential risk for movement side effects. Advised pt to contact office if movement side effects occur.   There are no diagnoses linked to this encounter.   Please see After Visit Summary  for patient specific instructions.  Future Appointments  Date Time Provider Department Center  03/15/2024  9:00 AM Marivel Mcclarty, Angeline Mattocks, NP [facility] None  03/18/2024  1:00 PM Burnetta Almarie DASEN [facility] CP[facility] None  04/24/2024 11:00 AM Burnetta Almarie DASEN, [facility] CP-CP None  05/06/2024  1:00 PM Burnetta Almarie DASEN, [facility] CP-CP None  05/09/2024  9:15 AM Gregg Lek, MD [facility] None    No orders of the defined types were placed in this encounter.     -------------------------------

## 2024-03-18 ENCOUNTER — Encounter: Payer: Self-pay | Admitting: Professional Counselor

## 2024-03-18 ENCOUNTER — Ambulatory Visit (INDEPENDENT_AMBULATORY_CARE_PROVIDER_SITE_OTHER): Admitting: Professional Counselor

## 2024-03-18 DIAGNOSIS — F411 Generalized anxiety disorder: Secondary | ICD-10-CM

## 2024-03-18 DIAGNOSIS — F331 Major depressive disorder, recurrent, moderate: Secondary | ICD-10-CM

## 2024-03-18 DIAGNOSIS — F4312 Post-traumatic stress disorder, chronic: Secondary | ICD-10-CM | POA: Diagnosis not present

## 2024-03-18 DIAGNOSIS — M6281 Muscle weakness (generalized): Secondary | ICD-10-CM | POA: Diagnosis not present

## 2024-03-18 DIAGNOSIS — I639 Cerebral infarction, unspecified: Secondary | ICD-10-CM | POA: Diagnosis not present

## 2024-03-18 NOTE — Progress Notes (Addendum)
 [facility] Counselor/Therapist Progress Note  Patient ID: Lawrence [name], MRN: [id number],    Date: 03/18/2024  Time Spent: 1:02 PM - 1:53 PM  Treatment Type: Individual Therapy  I connected with this patient by an approved telecommunication method (video), with his informed consent, and verifying identity and patient privacy.  I was located at my office and patient at his nursing care facility.  As needed, we discussed the limitations, risks, and security and privacy concerns associated with telehealth service, including the availability and conditions which currently govern in-person appointments and the possibility that 3rd-party payment may not be fully guaranteed and he may be responsible for charges.  After he indicated understanding, we proceeded with the session.  Also discussed treatment planning, as needed, including ongoing verbal agreement with the plan, the opportunity to ask and answer all questions, his demonstrated understanding of instructions, and his readiness to call the office should symptoms worsen or he feels he is in a crisis state and needs more immediate and tangible assistance.   Reported Symptoms: Irritability, mind racing, increased sociability, trouble concentrating, disturbing unwanted memories of trauma, stressful dreams of trauma, flashbacks, emotional distress upon trauma cues, physiological reactivity upon trauma cues, avoidance tendencies, memory concerns, strong negative beliefs, self blame, strong negative feelings, anhedonia, emotional distancing, trouble experiencing positive emotions, hypervigilance, easy startling, risky behavior by history, difficulty concentrating, sleep concerns, somatic concerns per health conditions, phase of life concerns, stress  Mental Status Exam:  Appearance:   Casual     Behavior:  Appropriate and Sharing  Motor:  Pt in wheelchair, upper body mobility per telehealth observation  Speech/Language:   Clear and Coherent and  Normal Rate  Affect:  Appropriate and Congruent  Mood:  normal  Thought process:  normal  Thought content:    WNL  Sensory/Perceptual disturbances:    WNL  Orientation:  oriented to person, place, time/date, and situation  Attention:  Good  Concentration:  Good  Memory:  WNL  Fund of knowledge:   Good  Insight:    Good  Judgment:   Good  Impulse Control:  Good   Risk Assessment: Danger to Self:  No Self-injurious Behavior: No Danger to Others: No Duty to Warn:no Physical Aggression / Violence:No  Access to Firearms a concern: No  Gang Involvement:No   Subjective: Patient presented to session to address concerns of depression, anxiety and trauma response pattern.  Patient reported minimal progress at this time.  Counselor facilitated PCL 5 and patient scored a 62 for both raw score and positive symptom score.  Counselor also facilitated MDQ for which patient scored a 7 out of 13.  Counselor and patient discussed results and while patient endorsed experience chronic trauma response pattern symptomology, he voiced not wishing to create a trauma goal for his treatment plan.  Patient shared regarding additional trauma experiences in his life, including in childhood.  Counselor held space for patient process work, actively listened, and affirmed patient feelings and experience.  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  Counselor will continue to track any symptoms of mania with patient going forward in treatment for further discernment.  Due to patient being on phone for telehealth appointment Counselor and patient discussed continuing treatment planning upon in  person  follow-up appointment.  Patient identified still having issues with care at his nursing home facility however he voiced having spoken with the nurse in charge about his concerns, and he reported administration that facility having had a recent review.  He voiced sense of hopefulness for changes.  Patient voiced struggling with neuropathy, sense of weakness and having had a mini stroke but sent him to the ER recently, and to be receiving medical care and taking rest as needed.  Interventions: Humanistic/Existential and Assessments, Treatment Planning  Diagnosis:   ICD-10-CM   1. Major depressive disorder, recurrent episode, moderate (HCC)  F33.1     2. Generalized anxiety disorder  F41.1     3. Post-traumatic stress disorder, chronic  F43.12       Plan: Patient is scheduled for follow-up; continue to build rapport, discuss treatment plan and obtain consent.  Lawrence [name], [facility]

## 2024-03-18 NOTE — Addendum Note (Signed)
 Addended by: BURNETTA NORRIS T on: 03/18/2024 05:50 PM   Modules accepted: Level of Service

## 2024-03-19 DIAGNOSIS — I639 Cerebral infarction, unspecified: Secondary | ICD-10-CM | POA: Diagnosis not present

## 2024-03-20 DIAGNOSIS — I639 Cerebral infarction, unspecified: Secondary | ICD-10-CM | POA: Diagnosis not present

## 2024-03-21 DIAGNOSIS — I639 Cerebral infarction, unspecified: Secondary | ICD-10-CM | POA: Diagnosis not present

## 2024-03-22 DIAGNOSIS — I639 Cerebral infarction, unspecified: Secondary | ICD-10-CM | POA: Diagnosis not present

## 2024-03-22 DIAGNOSIS — I739 Peripheral vascular disease, unspecified: Secondary | ICD-10-CM | POA: Diagnosis not present

## 2024-03-22 DIAGNOSIS — E1169 Type 2 diabetes mellitus with other specified complication: Secondary | ICD-10-CM | POA: Diagnosis not present

## 2024-03-22 DIAGNOSIS — M6281 Muscle weakness (generalized): Secondary | ICD-10-CM | POA: Diagnosis not present

## 2024-03-22 DIAGNOSIS — R239 Unspecified skin changes: Secondary | ICD-10-CM | POA: Diagnosis not present

## 2024-03-22 DIAGNOSIS — R278 Other lack of coordination: Secondary | ICD-10-CM | POA: Diagnosis not present

## 2024-03-22 DIAGNOSIS — L89890 Pressure ulcer of other site, unstageable: Secondary | ICD-10-CM | POA: Diagnosis not present

## 2024-03-25 DIAGNOSIS — I4891 Unspecified atrial fibrillation: Secondary | ICD-10-CM | POA: Diagnosis not present

## 2024-03-25 DIAGNOSIS — I639 Cerebral infarction, unspecified: Secondary | ICD-10-CM | POA: Diagnosis not present

## 2024-03-25 DIAGNOSIS — J449 Chronic obstructive pulmonary disease, unspecified: Secondary | ICD-10-CM | POA: Diagnosis not present

## 2024-03-25 DIAGNOSIS — E119 Type 2 diabetes mellitus without complications: Secondary | ICD-10-CM | POA: Diagnosis not present

## 2024-03-25 DIAGNOSIS — M6281 Muscle weakness (generalized): Secondary | ICD-10-CM | POA: Diagnosis not present

## 2024-03-25 DIAGNOSIS — G8194 Hemiplegia, unspecified affecting left nondominant side: Secondary | ICD-10-CM | POA: Diagnosis not present

## 2024-03-25 DIAGNOSIS — I5033 Acute on chronic diastolic (congestive) heart failure: Secondary | ICD-10-CM | POA: Diagnosis not present

## 2024-03-25 DIAGNOSIS — Z8673 Personal history of transient ischemic attack (TIA), and cerebral infarction without residual deficits: Secondary | ICD-10-CM | POA: Diagnosis not present

## 2024-03-26 DIAGNOSIS — I639 Cerebral infarction, unspecified: Secondary | ICD-10-CM | POA: Diagnosis not present

## 2024-03-26 DIAGNOSIS — E119 Type 2 diabetes mellitus without complications: Secondary | ICD-10-CM | POA: Diagnosis not present

## 2024-03-27 DIAGNOSIS — I639 Cerebral infarction, unspecified: Secondary | ICD-10-CM | POA: Diagnosis not present

## 2024-03-28 DIAGNOSIS — I639 Cerebral infarction, unspecified: Secondary | ICD-10-CM | POA: Diagnosis not present

## 2024-03-29 DIAGNOSIS — I639 Cerebral infarction, unspecified: Secondary | ICD-10-CM | POA: Diagnosis not present

## 2024-03-30 DIAGNOSIS — I639 Cerebral infarction, unspecified: Secondary | ICD-10-CM | POA: Diagnosis not present

## 2024-04-01 DIAGNOSIS — I639 Cerebral infarction, unspecified: Secondary | ICD-10-CM | POA: Diagnosis not present

## 2024-04-02 DIAGNOSIS — I482 Chronic atrial fibrillation, unspecified: Secondary | ICD-10-CM | POA: Diagnosis not present

## 2024-04-02 DIAGNOSIS — L89890 Pressure ulcer of other site, unstageable: Secondary | ICD-10-CM | POA: Diagnosis not present

## 2024-04-02 DIAGNOSIS — N1831 Chronic kidney disease, stage 3a: Secondary | ICD-10-CM | POA: Diagnosis not present

## 2024-04-02 DIAGNOSIS — I5033 Acute on chronic diastolic (congestive) heart failure: Secondary | ICD-10-CM | POA: Diagnosis not present

## 2024-04-02 DIAGNOSIS — I639 Cerebral infarction, unspecified: Secondary | ICD-10-CM | POA: Diagnosis not present

## 2024-04-03 DIAGNOSIS — I639 Cerebral infarction, unspecified: Secondary | ICD-10-CM | POA: Diagnosis not present

## 2024-04-05 DIAGNOSIS — M6281 Muscle weakness (generalized): Secondary | ICD-10-CM | POA: Diagnosis not present

## 2024-04-05 DIAGNOSIS — I639 Cerebral infarction, unspecified: Secondary | ICD-10-CM | POA: Diagnosis not present

## 2024-04-08 DIAGNOSIS — M6281 Muscle weakness (generalized): Secondary | ICD-10-CM | POA: Diagnosis not present

## 2024-04-08 DIAGNOSIS — I639 Cerebral infarction, unspecified: Secondary | ICD-10-CM | POA: Diagnosis not present

## 2024-04-09 DIAGNOSIS — I639 Cerebral infarction, unspecified: Secondary | ICD-10-CM | POA: Diagnosis not present

## 2024-04-09 DIAGNOSIS — R0603 Acute respiratory distress: Secondary | ICD-10-CM | POA: Diagnosis not present

## 2024-04-09 DIAGNOSIS — R0602 Shortness of breath: Secondary | ICD-10-CM | POA: Diagnosis not present

## 2024-04-09 DIAGNOSIS — M6281 Muscle weakness (generalized): Secondary | ICD-10-CM | POA: Diagnosis not present

## 2024-04-09 DIAGNOSIS — J449 Chronic obstructive pulmonary disease, unspecified: Secondary | ICD-10-CM | POA: Diagnosis not present

## 2024-04-10 DIAGNOSIS — I639 Cerebral infarction, unspecified: Secondary | ICD-10-CM | POA: Diagnosis not present

## 2024-04-11 DIAGNOSIS — I639 Cerebral infarction, unspecified: Secondary | ICD-10-CM | POA: Diagnosis not present

## 2024-04-11 DIAGNOSIS — M6281 Muscle weakness (generalized): Secondary | ICD-10-CM | POA: Diagnosis not present

## 2024-04-13 DIAGNOSIS — I639 Cerebral infarction, unspecified: Secondary | ICD-10-CM | POA: Diagnosis not present

## 2024-04-13 DIAGNOSIS — M6281 Muscle weakness (generalized): Secondary | ICD-10-CM | POA: Diagnosis not present

## 2024-04-14 ENCOUNTER — Encounter (HOSPITAL_COMMUNITY): Payer: Self-pay

## 2024-04-14 ENCOUNTER — Emergency Department (HOSPITAL_COMMUNITY)
Admission: EM | Admit: 2024-04-14 | Discharge: 2024-04-15 | Disposition: A | Discharge status: Home / Self Care | Attending: Emergency Medicine | Admitting: Emergency Medicine

## 2024-04-14 ENCOUNTER — Emergency Department (HOSPITAL_COMMUNITY)

## 2024-04-14 ENCOUNTER — Other Ambulatory Visit: Payer: Self-pay

## 2024-04-14 DIAGNOSIS — Z79899 Other long term (current) drug therapy: Secondary | ICD-10-CM | POA: Diagnosis not present

## 2024-04-14 DIAGNOSIS — I110 Hypertensive heart disease with heart failure: Secondary | ICD-10-CM | POA: Diagnosis not present

## 2024-04-14 DIAGNOSIS — S3993XA Unspecified injury of pelvis, initial encounter: Secondary | ICD-10-CM | POA: Diagnosis not present

## 2024-04-14 DIAGNOSIS — S0592XA Unspecified injury of left eye and orbit, initial encounter: Secondary | ICD-10-CM | POA: Diagnosis present

## 2024-04-14 DIAGNOSIS — M25561 Pain in right knee: Secondary | ICD-10-CM | POA: Insufficient documentation

## 2024-04-14 DIAGNOSIS — S0121XA Laceration without foreign body of nose, initial encounter: Secondary | ICD-10-CM | POA: Insufficient documentation

## 2024-04-14 DIAGNOSIS — W19XXXA Unspecified fall, initial encounter: Secondary | ICD-10-CM | POA: Insufficient documentation

## 2024-04-14 DIAGNOSIS — R519 Headache, unspecified: Secondary | ICD-10-CM | POA: Insufficient documentation

## 2024-04-14 DIAGNOSIS — I63542 Cerebral infarction due to unspecified occlusion or stenosis of left cerebellar artery: Secondary | ICD-10-CM | POA: Diagnosis not present

## 2024-04-14 DIAGNOSIS — J4489 Other specified chronic obstructive pulmonary disease: Secondary | ICD-10-CM | POA: Diagnosis not present

## 2024-04-14 DIAGNOSIS — S0181XA Laceration without foreign body of other part of head, initial encounter: Secondary | ICD-10-CM

## 2024-04-14 DIAGNOSIS — S0990XA Unspecified injury of head, initial encounter: Secondary | ICD-10-CM | POA: Diagnosis not present

## 2024-04-14 DIAGNOSIS — S01112A Laceration without foreign body of left eyelid and periocular area, initial encounter: Secondary | ICD-10-CM | POA: Diagnosis not present

## 2024-04-14 DIAGNOSIS — I5032 Chronic diastolic (congestive) heart failure: Secondary | ICD-10-CM | POA: Diagnosis not present

## 2024-04-14 DIAGNOSIS — M1711 Unilateral primary osteoarthritis, right knee: Secondary | ICD-10-CM | POA: Diagnosis not present

## 2024-04-14 DIAGNOSIS — I517 Cardiomegaly: Secondary | ICD-10-CM | POA: Diagnosis not present

## 2024-04-14 DIAGNOSIS — E1142 Type 2 diabetes mellitus with diabetic polyneuropathy: Secondary | ICD-10-CM | POA: Insufficient documentation

## 2024-04-14 DIAGNOSIS — E1159 Type 2 diabetes mellitus with other circulatory complications: Secondary | ICD-10-CM | POA: Diagnosis not present

## 2024-04-14 DIAGNOSIS — I69811 Memory deficit following other cerebrovascular disease: Secondary | ICD-10-CM | POA: Diagnosis not present

## 2024-04-14 DIAGNOSIS — S3991XA Unspecified injury of abdomen, initial encounter: Secondary | ICD-10-CM | POA: Diagnosis not present

## 2024-04-14 DIAGNOSIS — Z794 Long term (current) use of insulin: Secondary | ICD-10-CM | POA: Diagnosis not present

## 2024-04-14 DIAGNOSIS — S299XXA Unspecified injury of thorax, initial encounter: Secondary | ICD-10-CM | POA: Insufficient documentation

## 2024-04-14 DIAGNOSIS — I5030 Unspecified diastolic (congestive) heart failure: Secondary | ICD-10-CM | POA: Diagnosis not present

## 2024-04-14 DIAGNOSIS — S8991XA Unspecified injury of right lower leg, initial encounter: Secondary | ICD-10-CM | POA: Diagnosis not present

## 2024-04-14 DIAGNOSIS — M25461 Effusion, right knee: Secondary | ICD-10-CM | POA: Diagnosis not present

## 2024-04-14 DIAGNOSIS — Z7901 Long term (current) use of anticoagulants: Secondary | ICD-10-CM | POA: Insufficient documentation

## 2024-04-14 DIAGNOSIS — J439 Emphysema, unspecified: Secondary | ICD-10-CM | POA: Diagnosis not present

## 2024-04-14 DIAGNOSIS — R262 Difficulty in walking, not elsewhere classified: Secondary | ICD-10-CM | POA: Diagnosis not present

## 2024-04-14 DIAGNOSIS — S199XXA Unspecified injury of neck, initial encounter: Secondary | ICD-10-CM | POA: Diagnosis not present

## 2024-04-14 LAB — URINALYSIS, ROUTINE W REFLEX MICROSCOPIC
Bilirubin Urine: NEGATIVE
Glucose, UA: >=500 mg/dL — AB
Hgb urine dipstick: NEGATIVE
Ketones, ur: NEGATIVE mg/dL
Leukocytes,Ua: NEGATIVE
Nitrite: NEGATIVE
Protein, ur: NEGATIVE mg/dL
Specific Gravity, Urine: 1.018 (ref 1.005–1.030)
pH: 5 (ref 5.0–8.0)

## 2024-04-14 LAB — I-STAT CG4 LACTIC ACID, ED: Lactic Acid, Venous: 2.1 mmol/L (ref 0.5–1.9)

## 2024-04-14 LAB — I-STAT CHEM 8, ED
BUN: 23 mg/dL (ref 8–23)
Calcium, Ion: 1.09 mmol/L — ABNORMAL LOW (ref 1.15–1.40)
Chloride: 98 mmol/L (ref 98–111)
Creatinine, Ser: 1.4 mg/dL — ABNORMAL HIGH (ref 0.61–1.24)
Glucose, Bld: 340 mg/dL — ABNORMAL HIGH (ref 70–99)
HCT: 42 % (ref 39.0–52.0)
Hemoglobin: 14.3 g/dL (ref 13.0–17.0)
Potassium: 3.8 mmol/L (ref 3.5–5.1)
Sodium: 138 mmol/L (ref 135–145)
TCO2: 28 mmol/L (ref 22–32)

## 2024-04-14 LAB — COMPREHENSIVE METABOLIC PANEL WITH GFR
ALT: 17 U/L (ref 0–44)
AST: 18 U/L (ref 15–41)
Albumin: 3.7 g/dL (ref 3.5–5.0)
Alkaline Phosphatase: 86 U/L (ref 38–126)
Anion gap: 12 (ref 5–15)
BUN: 21 mg/dL (ref 8–23)
CO2: 29 mmol/L (ref 22–32)
Calcium: 9.3 mg/dL (ref 8.9–10.3)
Chloride: 95 mmol/L — ABNORMAL LOW (ref 98–111)
Creatinine, Ser: 1.34 mg/dL — ABNORMAL HIGH (ref 0.61–1.24)
GFR, Estimated: 58 mL/min — ABNORMAL LOW (ref >60)
Glucose, Bld: 333 mg/dL — ABNORMAL HIGH (ref 70–99)
Potassium: 3.9 mmol/L (ref 3.5–5.1)
Sodium: 136 mmol/L (ref 135–145)
Total Bilirubin: 1.1 mg/dL (ref 0.0–1.2)
Total Protein: 7.2 g/dL (ref 6.5–8.1)

## 2024-04-14 LAB — CBC
HCT: 45.8 % (ref 39.0–52.0)
Hemoglobin: 15.1 g/dL (ref 13.0–17.0)
MCH: 28.6 pg (ref 26.0–34.0)
MCHC: 33 g/dL (ref 30.0–36.0)
MCV: 86.7 fL (ref 80.0–100.0)
Platelets: 284 K/uL (ref 150–400)
RBC: 5.28 MIL/uL (ref 4.22–5.81)
RDW: 14.1 % (ref 11.5–15.5)
WBC: 9.4 K/uL (ref 4.0–10.5)
nRBC: 0 % (ref 0.0–0.2)

## 2024-04-14 LAB — PROTIME-INR
INR: 0.9 (ref 0.8–1.2)
Prothrombin Time: 12.8 s (ref 11.4–15.2)

## 2024-04-14 LAB — POC OCCULT BLOOD, ED: Fecal Occult Bld: NEGATIVE

## 2024-04-14 LAB — ETHANOL: Alcohol, Ethyl (B): <15 mg/dL (ref <15)

## 2024-04-14 LAB — SAMPLE TO BLOOD BANK

## 2024-04-14 LAB — TROPONIN I (HIGH SENSITIVITY)
Troponin I (High Sensitivity): 8 ng/L (ref <18)
Troponin I (High Sensitivity): 7 ng/L (ref <18)

## 2024-04-14 MED ORDER — HYDROCODONE-ACETAMINOPHEN 5-325 MG PO TABS
2.0000 | ORAL_TABLET | Freq: Once | ORAL | Status: AC
  Administered 2024-04-14: 2 via ORAL
  Filled 2024-04-14: qty 2

## 2024-04-14 MED ORDER — SODIUM CHLORIDE 0.9 % IV BOLUS
1000.0000 mL | Freq: Once | INTRAVENOUS | Status: AC
  Administered 2024-04-14: 1000 mL via INTRAVENOUS

## 2024-04-14 MED ORDER — IOHEXOL 350 MG/ML SOLN
100.0000 mL | Freq: Once | INTRAVENOUS | Status: AC | PRN
Start: 2024-04-14 — End: 2024-04-14
  Administered 2024-04-14: 100 mL via INTRAVENOUS

## 2024-04-14 NOTE — ED Triage Notes (Signed)
 PER EMS: pt is from [facility], was found on the floor tonight by staff. He possibly rolled out of his wheelchair while watching a football game. + blood thinners, lac to left forehead and bridge of his nose. He does not remember if he lost consciousness. Hx of stroke with residual left sided weakness.    BP- 156/80, HR-80, RR-20, 92% RA, CBG-283

## 2024-04-14 NOTE — Discharge Instructions (Addendum)
 You were seen in the emergency department for your traumatic injuries.  After review of all your labs and imaging it does not appear that you have any acute fractures.  Your lacerations were repaired with tissue adhesive.  Please read the information before.  Use ice for pain and take your regular pain medications.  Get help right away for any new or worsening symptoms  Do not scratch, rub, or pick at the adhesive. Leave tissue adhesive in place. It will come off naturally after 7-10 days. Do not place tape over the adhesive. The adhesive could come off the wound when you pull the tape off. Protect the wound from further injury until it is healed. Check your wound area every day for signs of infection. Check for: More redness, swelling, or pain,Fluid or blood,Warmth, Pus or a bad smell. Do not take baths, swim, or use a hot tub until your health care provider approves. You may only be allowed to take sponge baths. Ask your health care provider if you may take showers.You can usually shower after the first 24 hours. Cover the dressing with a watertight covering when you take a shower. Do not soak the area where there is tissue adhesive. Do not use any soaps, petroleum jelly products, or ointments on the wound. Certain ointments can weaken the adhesive.

## 2024-04-14 NOTE — ED Provider Notes (Signed)
 [facility] AT [facility] Provider Note   CSN: [id number] Arrival date & time: 04/14/24  1748     Patient presents with: Lawrence [name] is a 68 y.o. male with an extensive past medical history who presents to the emergency department as a level 2 trauma due to blunt facial trauma on anticoagulation.  Patient is disoriented to the event states the right last thing he remembers is sitting in his wheelchair.  EMS reports that staff found him on the ground with blunt facial trauma.  He is complaining of a headache.  He is also complaining of pain in his right knee which she states is chronic.  He is unsure if he may have lost consciousness.    Fall       Prior to Admission medications   Medication Sig Start Date End Date Taking? Authorizing Provider  acetaminophen  (TYLENOL ) 650 MG CR tablet Take 650 mg by mouth every 8 (eight) hours as needed for pain.    [provider]  albuterol  (VENTOLIN  HFA) 108 (90 Base) MCG/ACT inhaler Inhale 2 puffs into the lungs every 6 (six) hours as needed for wheezing or shortness of breath. 04/17/23   Hunsucker, Donnice SAUNDERS, MD  allopurinol  (ZYLOPRIM ) 100 MG tablet Take 1 tablet (100 mg total) by mouth daily. 07/25/23 07/24/24  Elgergawy, Brayton RAMAN, MD  apixaban  (ELIQUIS ) 5 MG TABS tablet Take 1 tablet (5 mg total) by mouth 2 (two) times daily. 04/17/23   Rolan Ezra RAMAN, MD  ARIPiprazole  (ABILIFY ) 5 MG tablet TAKE 1 TABLET BY MOUTH EVERYDAY AT BEDTIME 10/03/23   Mozingo, Regina Nattalie, NP  atorvastatin  (LIPITOR ) 80 MG tablet Take 1 tablet (80 mg total) by mouth at bedtime. 12/20/23   Cherlyn Labella, MD  bisacodyl  (DULCOLAX) 5 MG EC tablet Take 10 mg by mouth daily as needed for moderate constipation.    [provider]  busPIRone  (BUSPAR ) 5 MG tablet Take 1 tablet (5 mg total) by mouth 3 (three) times daily. 12/20/23   Akula, Vijaya, MD  cephALEXin  (KEFLEX ) 500 MG capsule Take 1 capsule (500 mg total) by mouth 4  (four) times daily. 01/29/24   Sofia, Leslie K, PA-C  colchicine  0.6 MG tablet Take 1 tablet (0.6 mg total) by mouth daily. 07/12/23   Love, Sharlet RAMAN, PA-C  cyanocobalamin  (VITAMIN B12) 1000 MCG tablet Take 1,000 mcg by mouth daily.    [provider]  fenofibrate  (TRICOR ) 145 MG tablet Take 1 tablet (145 mg total) by mouth daily. 04/17/23   Rolan Ezra RAMAN, MD  ferrous sulfate  325 (65 FE) MG tablet Take 325 mg by mouth daily.    [provider]  fluticasone  (FLONASE ) 50 MCG/ACT nasal spray Place 2 sprays into both nostrils daily.     [provider]  guaiFENesin -dextromethorphan (ROBITUSSIN DM) 100-10 MG/5ML syrup Take 5 mLs by mouth every 4 (four) hours as needed for cough. Patient not taking: Reported on 03/07/2024 10/25/23   Rai, Nydia POUR, MD  HUMALOG KWIKPEN 100 UNIT/ML KwikPen Inject 2-15 Units into the skin. 121-150 2 units 151-200 3 units  201-250 5 units  251-300 8 units  301-350 11 units  351-400 15 units 12/11/23   [provider]  HYDROcodone -acetaminophen  (NORCO/VICODIN) 5-325 MG tablet Take 1 tablet by mouth every 6 (six) hours as needed for severe pain (pain score 7-10). 12/08/23   Elgergawy, Brayton RAMAN, MD  INCRUSE ELLIPTA  62.5 MCG/ACT AEPB Inhale 1 puff into the lungs daily. 12/11/23  [provider]  insulin  aspart (NOVOLOG ) 100 UNIT/ML injection Inject 0-15 Units into the skin 3 (three) times daily with meals. Sliding scale  CBG 70 - 120: 0 units: CBG 121 - 150: 2 units; CBG 151 - 200: 3 units; CBG 201 - 250: 5 units; CBG 251 - 300: 8 units;CBG 301 - 350: 11 units; CBG 351 - 400: 15 units; CBG > 400 : 15 units and notify MD 10/25/23   Rai, Nydia POUR, MD  Lacosamide  150 MG TABS Take 1 tablet (150 mg total) by mouth 2 (two) times daily. 10/10/23   Briana Elgin LABOR, MD  levETIRAcetam  (KEPPRA ) 500 MG tablet Take 1 tablet (500 mg total) by mouth 2 (two) times daily. 05/04/23   Gregg Lek, MD  loperamide  (IMODIUM ) 2 MG capsule Take 2 mg by mouth  daily as needed for diarrhea or loose stools.    [provider]  methocarbamol  (ROBAXIN ) 500 MG tablet Take 500 mg by mouth every 8 (eight) hours as needed for muscle spasms.    [provider]  metolazone  (ZAROXOLYN ) 2.5 MG tablet Take 2.5 mg by mouth once a week. Every Tuesday. 09/20/23   [provider]  midodrine  (PROAMATINE ) 5 MG tablet Take 1 tablet (5 mg total) by mouth 3 (three) times daily with meals. 12/08/23   Elgergawy, Brayton RAMAN, MD  omega-3 acid ethyl esters (LOVAZA ) 1 g capsule Take 2 g by mouth 2 (two) times daily.    [provider]  omeprazole (PRILOSEC) 20 MG capsule Take 20 mg by mouth daily. 05/29/20   [provider]  polyethylene glycol powder (GLYCOLAX /MIRALAX ) 17 GM/SCOOP powder Mix in water as directed and take 1 capful (17 g) by mouth daily. 07/12/23   Love, Sharlet RAMAN, PA-C  potassium chloride  SA (KLOR-CON  M20) 20 MEQ tablet Take 1-2 tablets (20-40 mEq total) by mouth See admin instructions. Take 2 tablets in the morning and 1 tablet in the evening Patient taking differently: Take 20-40 mEq by mouth See admin instructions. Take 40 meq in the morning and 20 meq in the evening. 10/10/23   Briana Elgin LABOR, MD  prazosin  (MINIPRESS ) 2 MG capsule Take 2 mg by mouth at bedtime. 08/17/23   [provider]  pregabalin  (LYRICA ) 150 MG capsule Take 1 capsule (150 mg total) by mouth 3 (three) times daily. 10/10/23   Briana Elgin LABOR, MD  rOPINIRole  (REQUIP ) 0.25 MG tablet Take 1 tablet (0.25 mg total) by mouth 2 (two) times daily. 07/25/23   Elgergawy, Brayton RAMAN, MD  sennosides-docusate sodium  (SENOKOT-S) 8.6-50 MG tablet Take 2 tablets by mouth in the morning and at bedtime.    [provider]  spironolactone  (ALDACTONE ) 25 MG tablet Take 12.5 mg by mouth daily. 09/01/23   [provider]  tirzepatide  (MOUNJARO ) 15 MG/0.5ML Pen INJECT 15 MG INTO THE SKIN ONCE A WEEK. Patient taking differently: Inject 15 mg into the skin once a  week. Mondays 06/07/23   Motwani, Komal, MD  torsemide  (DEMADEX ) 20 MG tablet Take 1 tablet (20 mg total) by mouth 2 (two) times daily. 12/08/23   Elgergawy, Brayton RAMAN, MD  traZODone  (DESYREL ) 150 MG tablet Take 1 tablet (150 mg total) by mouth at bedtime. 12/20/23   Akula, Vijaya, MD  venlafaxine  XR (EFFEXOR -XR) 150 MG 24 hr capsule Take 150 mg by mouth daily with breakfast.    [provider]    Allergies: Niacin and related; Ppd [tuberculin purified protein derivative]; Tubersol [tuberculin, ppd]; Firvanq  [vancomycin ]; and Vibramycin  [doxycycline ]  Review of Systems  Updated Vital Signs BP 139/77   Pulse 78   Temp (!) 97.4 F (36.3 C) (Axillary)   Resp 18   Ht 6' 2 (1.88 m)   Wt (!) 150.6 kg   SpO2 94%   BMI 42.63 kg/m   Physical Exam Constitutional:      Comments: Patient is alert and responds to voice but keeps eyes closed and is somewhat slow to follow commands or answer questions  HENT:     Head: Normocephalic.     Comments: Laceration to the left brow and nasal bridge    Nose: Nose normal.     Mouth/Throat:     Mouth: Mucous membranes are moist.  Eyes:     Extraocular Movements: Extraocular movements intact.     Pupils: Pupils are equal, round, and reactive to light.     Comments: Pupils 1+ reactive  Neck:     Comments: C-collar in place Cardiovascular:     Rate and Rhythm: Normal rate.     Pulses: Normal pulses.  Pulmonary:     Effort: Pulmonary effort is normal.     Breath sounds: Normal breath sounds.     Comments: Normal breath sounds bilaterally, no chest wall tenderness or crepitus noted Abdominal:     Comments: Obese abdomen without evidence of bruising, no tenderness  Musculoskeletal:     Comments: Spinal precautions maintained, no midline spinal tenderness normal rectal tone Patient moves right side without ataxia he has 4 out of 5 strength on the left which is chronic.  There is a well-healed surgical scar over the left knee, there is swelling  of the right knee but no evidence of acute deformity or bruising.  Bilateral DP pulses are 2+.  Normal grip strength in the right hand diminished on the left.  Pelvis is stable to pressure no pain in the hips.  Neurological:     Mental Status: He is disoriented.     GCS: GCS eye subscore is 3. GCS verbal subscore is 4. GCS motor subscore is 6.     (all labs ordered are listed, but only abnormal results are displayed) Labs Reviewed  I-STAT CHEM 8, ED - Abnormal; Notable for the following components:      Result Value   Creatinine, Ser 1.40 (*)    Glucose, Bld 340 (*)    Calcium , Ion 1.09 (*)    All other components within normal limits  I-STAT CG4 LACTIC ACID, ED - Abnormal; Notable for the following components:   Lactic Acid, Venous 2.1 (*)    All other components within normal limits  COMPREHENSIVE METABOLIC PANEL WITH GFR  CBC  ETHANOL  URINALYSIS, ROUTINE W REFLEX MICROSCOPIC  PROTIME-INR  POC OCCULT BLOOD, ED  SAMPLE TO BLOOD BANK  TROPONIN I (HIGH SENSITIVITY)    EKG: None  Radiology: No results found.   Wound closure utilizing adhes only  Date/Time: 04/21/2024 10:35 AM  Performed by: [name] Chroman, PA-C Authorized by: [name] Chroman, PA-C  Consent: Verbal consent obtained Risks and benefits: risks, benefits and alternatives were discussed Consent given by: patient Patient identity confirmed: verbally with patient Time out: Immediately prior to procedure a time out was called to verify the correct patient, procedure, equipment, support staff and site/side marked as required. Preparation: Patient was prepped and draped in the usual sterile fashion. Local anesthesia used: no  Anesthesia: Local anesthesia used: no Patient tolerance: patient tolerated the procedure well with no immediate complications Comments: Forehaed lac repeared with adhesive  Medications Ordered in the ED - No data to display                                  Medical Decision  Making Amount and/or Complexity of Data Reviewed Labs: ordered. Radiology: ordered. ECG/medicine tests: ordered.  Risk Prescription drug management.   This patient presents to the ED for concern of trauma, this involves an extensive number of treatment options, and is a complaint that carries with it a high risk of complications and morbidity.  The emergent differential diagnosis for trauma is extensive and requires complex medical decision making. The differential includes, but is not limited to traumatic brain injury, Orbital trauma, maxillofacial trauma, skull fracture, blunt/penetrating neck trauma, vertebral artery dissection, whiplash, cervical fracture, neurogenic shock, spinal cord injury, thoracic trauma (blunt/penetrating) cardiac trauma, thoracic and lumbar spine trauma. Abdominal trauma (blunt. Penetrating), genitourinary trauma, extremity fractures, skin lacerations/ abrasions, vascular injuries.   Co morbidities:    has a past medical history of Acquired dilation of ascending aorta and aortic root (HCC), Adenomatous colon polyp (2007), Anemia, Anxiety, Asthma, BPH without obstruction/lower urinary tract symptoms (02/22/2017), Chronic diastolic (congestive) heart failure (HCC), Chronic venous stasis (03/07/2019), COPD (chronic obstructive pulmonary disease) (HCC), Coronary artery calcification seen on CAT scan, Depression, Diabetic neuropathy (HCC) (09/11/2019), History of colon polyps (08/24/2018), Hypertension, Morbid obesity (HCC), OSA (obstructive sleep apnea), Pain due to onychomycosis of toenails of both feet (09/11/2019), Peripheral neuropathy (02/22/2017), Primary osteoarthritis, left shoulder (03/05/2017), PTSD (post-traumatic stress disorder), Pure hypercholesterolemia, QT prolongation (03/07/2019), S/P TAVR (transcatheter aortic valve replacement) (02/07/2023), Seizures (HCC), Severe aortic stenosis, Sinus tachycardia (03/07/2019), Sleep apnea, and Type 2 diabetes mellitus  with vascular disease (HCC) (09/11/2019).   Social Determinants of Health:    SDOH Screenings   Food Insecurity: No Food Insecurity (12/15/2023)  Housing: High Risk (12/15/2023)  Transportation Needs: No Transportation Needs (12/15/2023)  Recent Concern: Transportation Needs - Unmet Transportation Needs (10/03/2023)  Utilities: Not At Risk (12/15/2023)  Alcohol  Screen: Low Risk  (06/08/2021)  Depression (PHQ2-9): High Risk (02/26/2024)  Financial Resource Strain: Medium Risk (12/06/2021)   Received from [facility]  Physical Activity: Unknown (12/06/2021)   Received from [facility]  Social Connections: Socially Isolated (12/15/2023)  Stress: No Stress Concern Present (01/21/2022)   Received from [facility]  Recent Concern: Stress - Stress Concern Present (11/19/2021)   Received from [facility]  Tobacco Use: Medium Risk (04/14/2024)     Additional history:  Additional history obtained from EMS  {External records from outside source obtained and reviewed including MAR   Lab Tests:  I Ordered, and personally interpreted labs.  The pertinent results include:    UA negative Tropon negative x 2 EHtanol/ INR(trauma pnl negative)  Negative occult stool No pertinent findings Imaging Studies:  I ordered imaging studies including CT ( head, cspine, maxface, chest abd and pv) plain films- chest and pv I independently visualized and interpreted imaging which showed NO acute findings I agree with the radiologist interpretation  Cardiac Monitoring/ECG:  The patient was maintained on a cardiac monitor.  I personally viewed and interpreted the cardiac monitored which showed an underlying rhythm of:   NSR rate 85  Test Considered:    Critical Interventions:    Consultations Obtained:   Problem List / ED Course:  No diagnosis found.  MDM: Patient with fall, No evidence of acute traumatic injury , maintained on cardiac monitor, no evidence of gi hemorrhage  or  arrhythmia. Patient is at baseline mental status . Patient seen in shared visit with attending physician. Who agrees with assessment, work up , treatment, and plan for discharge back to facility.     Dispostion:  After consideration of the diagnostic results and the patients response to treatment, I feel that the patent would benefit from discharge .      Final diagnoses:  None    ED Discharge Orders     None          Hansen, Carino, PA-C 04/21/24 1051    Randol Simmonds, MD 04/22/24 [phone]

## 2024-04-14 NOTE — ED Notes (Addendum)
Transported to CT by RN.

## 2024-04-15 ENCOUNTER — Other Ambulatory Visit: Payer: Self-pay

## 2024-04-15 DIAGNOSIS — Z7401 Bed confinement status: Secondary | ICD-10-CM | POA: Diagnosis not present

## 2024-04-15 DIAGNOSIS — Z743 Need for continuous supervision: Secondary | ICD-10-CM | POA: Diagnosis not present

## 2024-04-15 NOTE — ED Notes (Signed)
 [facility]tar called

## 2024-04-16 DIAGNOSIS — I639 Cerebral infarction, unspecified: Secondary | ICD-10-CM | POA: Diagnosis not present

## 2024-04-16 DIAGNOSIS — M6281 Muscle weakness (generalized): Secondary | ICD-10-CM | POA: Diagnosis not present

## 2024-04-17 DIAGNOSIS — M6281 Muscle weakness (generalized): Secondary | ICD-10-CM | POA: Diagnosis not present

## 2024-04-17 DIAGNOSIS — I639 Cerebral infarction, unspecified: Secondary | ICD-10-CM | POA: Diagnosis not present

## 2024-04-18 DIAGNOSIS — I639 Cerebral infarction, unspecified: Secondary | ICD-10-CM | POA: Diagnosis not present

## 2024-04-18 DIAGNOSIS — I5033 Acute on chronic diastolic (congestive) heart failure: Secondary | ICD-10-CM | POA: Diagnosis not present

## 2024-04-18 DIAGNOSIS — E119 Type 2 diabetes mellitus without complications: Secondary | ICD-10-CM | POA: Diagnosis not present

## 2024-04-18 DIAGNOSIS — M6281 Muscle weakness (generalized): Secondary | ICD-10-CM | POA: Diagnosis not present

## 2024-04-19 DIAGNOSIS — M6281 Muscle weakness (generalized): Secondary | ICD-10-CM | POA: Diagnosis not present

## 2024-04-19 DIAGNOSIS — I639 Cerebral infarction, unspecified: Secondary | ICD-10-CM | POA: Diagnosis not present

## 2024-04-22 DIAGNOSIS — M6281 Muscle weakness (generalized): Secondary | ICD-10-CM | POA: Diagnosis not present

## 2024-04-22 DIAGNOSIS — I639 Cerebral infarction, unspecified: Secondary | ICD-10-CM | POA: Diagnosis not present

## 2024-04-24 ENCOUNTER — Ambulatory Visit: Admitting: Professional Counselor

## 2024-04-24 ENCOUNTER — Encounter: Payer: Self-pay | Admitting: Professional Counselor

## 2024-04-24 DIAGNOSIS — F411 Generalized anxiety disorder: Secondary | ICD-10-CM

## 2024-04-24 DIAGNOSIS — F331 Major depressive disorder, recurrent, moderate: Secondary | ICD-10-CM | POA: Diagnosis not present

## 2024-04-24 NOTE — Progress Notes (Addendum)
      [facility] Counselor/Therapist Progress Note  Patient ID: Lawrence [name], MRN: [id number],    Date: 04/24/2024  Time Spent: 11:10 AM to 12:08 PM  Treatment Type: Individual Therapy  Reported Symptoms: Anhedonia, low mood, sleep concerns, fatigue, appetite concerns, self-esteem concerns, restlessness, frustration, anxiousness, worries, phase of life concerns, health concerns, care concerns  Mental Status Exam:  Appearance:   Casual     Behavior:  Appropriate and Sharing  Motor:  Patient in wheelchair for session, patient challenges with motor skills regarding use of hands (see below)  Speech/Language:   Clear and Coherent and Normal Rate  Affect:  Appropriate and Congruent  Mood:  normal  Thought process:  normal  Thought content:    WNL  Sensory/Perceptual disturbances:    WNL  Orientation:  oriented to person, place, time/date, and situation  Attention:  Good  Concentration:  Good  Memory:  WNL  Fund of knowledge:   Good  Insight:    Good  Judgment:   Good  Impulse Control:  Good   Risk Assessment: Danger to Self:  No Self-injurious Behavior: No Danger to Others: No Duty to Warn:no Physical Aggression / Violence:No  Access to Firearms a concern: No  Gang Involvement:No   Subjective: Patient presented to session to address concerns of anxiety and depression.  He reported minimal progress at this time.  Patient voiced increased frustration and distress around care at facility where he lives and receives nursing care.  He voiced continued discussion with director of facility, and his ombudsman, however to be experiencing minimal change.  He voiced that while it is not consistent, he is often left without change from adult dipaer for up to 2 hours at a time, is often bathed only once a month, and that wounds on his scrotum and legs are often redressed only once a week.  He reported increased anxiety regarding fighting for his rights.  Counselor called [facility] and spoke to representative Myrick Sick, who together with patient took information for report.  Counselor and Ms. Sick spoke for 10 minutes after session for follow-up without patient as well.  During session Counselor and patient discussed patient treatment plan.  Due to patient difficulty in using his hands due to motor limitations, counselor printed treatment plan and discussed with patient.  Patient gave his verbal consent due to motor limitations, signed consent requiring use of track pad.  Patient took printout of treatment plan with him after session.  Patient processed experience of his arms and legs failing [him] repeatedly, and other challenges with his health concerns.  Counselor actively listened, affirmed patient feelings and experience, and reinforced patient continued self advocacy and medical care follow-up.  Interventions: Solution-Oriented/Positive Psychology, Humanistic/Existential, Insight-Oriented, and Treatment Planning, Safety Planning  Diagnosis:   ICD-10-CM   1. Major depressive disorder, recurrent episode, moderate (HCC)  F33.1     2. Generalized anxiety disorder  F41.1       Plan: Patient is scheduled for follow-up; continue process work and developing coping skills.  Continue to assess for patient's safety and wellbeing.  STG between sessions for patient to prioritize and continue self advocacy efforts and medical care engagement.  Almarie ONEIDA Sprang, [facility]

## 2024-04-26 DIAGNOSIS — Z8673 Personal history of transient ischemic attack (TIA), and cerebral infarction without residual deficits: Secondary | ICD-10-CM | POA: Diagnosis not present

## 2024-04-26 DIAGNOSIS — I5033 Acute on chronic diastolic (congestive) heart failure: Secondary | ICD-10-CM | POA: Diagnosis not present

## 2024-04-26 DIAGNOSIS — L8990 Pressure ulcer of unspecified site, unspecified stage: Secondary | ICD-10-CM | POA: Diagnosis not present

## 2024-04-26 DIAGNOSIS — G8194 Hemiplegia, unspecified affecting left nondominant side: Secondary | ICD-10-CM | POA: Diagnosis not present

## 2024-04-26 DIAGNOSIS — I4891 Unspecified atrial fibrillation: Secondary | ICD-10-CM | POA: Diagnosis not present

## 2024-04-26 DIAGNOSIS — J449 Chronic obstructive pulmonary disease, unspecified: Secondary | ICD-10-CM | POA: Diagnosis not present

## 2024-04-26 DIAGNOSIS — E119 Type 2 diabetes mellitus without complications: Secondary | ICD-10-CM | POA: Diagnosis not present

## 2024-04-26 DIAGNOSIS — M6281 Muscle weakness (generalized): Secondary | ICD-10-CM | POA: Diagnosis not present

## 2024-04-26 DIAGNOSIS — I639 Cerebral infarction, unspecified: Secondary | ICD-10-CM | POA: Diagnosis not present

## 2024-04-28 DIAGNOSIS — M6281 Muscle weakness (generalized): Secondary | ICD-10-CM | POA: Diagnosis not present

## 2024-04-28 DIAGNOSIS — I639 Cerebral infarction, unspecified: Secondary | ICD-10-CM | POA: Diagnosis not present

## 2024-04-29 DIAGNOSIS — I639 Cerebral infarction, unspecified: Secondary | ICD-10-CM | POA: Diagnosis not present

## 2024-04-29 DIAGNOSIS — M6281 Muscle weakness (generalized): Secondary | ICD-10-CM | POA: Diagnosis not present

## 2024-04-30 DIAGNOSIS — M6281 Muscle weakness (generalized): Secondary | ICD-10-CM | POA: Diagnosis not present

## 2024-04-30 DIAGNOSIS — I639 Cerebral infarction, unspecified: Secondary | ICD-10-CM | POA: Diagnosis not present

## 2024-05-01 DIAGNOSIS — I5033 Acute on chronic diastolic (congestive) heart failure: Secondary | ICD-10-CM | POA: Diagnosis not present

## 2024-05-01 DIAGNOSIS — L8990 Pressure ulcer of unspecified site, unspecified stage: Secondary | ICD-10-CM | POA: Diagnosis not present

## 2024-05-01 DIAGNOSIS — E119 Type 2 diabetes mellitus without complications: Secondary | ICD-10-CM | POA: Diagnosis not present

## 2024-05-01 DIAGNOSIS — Z8673 Personal history of transient ischemic attack (TIA), and cerebral infarction without residual deficits: Secondary | ICD-10-CM | POA: Diagnosis not present

## 2024-05-01 DIAGNOSIS — G8194 Hemiplegia, unspecified affecting left nondominant side: Secondary | ICD-10-CM | POA: Diagnosis not present

## 2024-05-01 DIAGNOSIS — I4891 Unspecified atrial fibrillation: Secondary | ICD-10-CM | POA: Diagnosis not present

## 2024-05-01 DIAGNOSIS — I639 Cerebral infarction, unspecified: Secondary | ICD-10-CM | POA: Diagnosis not present

## 2024-05-01 DIAGNOSIS — M6281 Muscle weakness (generalized): Secondary | ICD-10-CM | POA: Diagnosis not present

## 2024-05-01 DIAGNOSIS — J449 Chronic obstructive pulmonary disease, unspecified: Secondary | ICD-10-CM | POA: Diagnosis not present

## 2024-05-02 ENCOUNTER — Other Ambulatory Visit: Payer: Self-pay

## 2024-05-02 ENCOUNTER — Emergency Department (HOSPITAL_COMMUNITY)

## 2024-05-02 ENCOUNTER — Inpatient Hospital Stay (HOSPITAL_COMMUNITY)
Admission: EM | Admit: 2024-05-02 | Discharge: 2024-05-08 | DRG: 872 | Disposition: A | Discharge status: Skilled Nursing Facility | Source: Skilled Nursing Facility | Attending: Internal Medicine | Admitting: Internal Medicine

## 2024-05-02 ENCOUNTER — Encounter (HOSPITAL_COMMUNITY): Payer: Self-pay

## 2024-05-02 DIAGNOSIS — J4489 Other specified chronic obstructive pulmonary disease: Secondary | ICD-10-CM | POA: Diagnosis present

## 2024-05-02 DIAGNOSIS — R29714 NIHSS score 14: Secondary | ICD-10-CM | POA: Diagnosis present

## 2024-05-02 DIAGNOSIS — R4781 Slurred speech: Secondary | ICD-10-CM

## 2024-05-02 DIAGNOSIS — I5032 Chronic diastolic (congestive) heart failure: Secondary | ICD-10-CM | POA: Diagnosis present

## 2024-05-02 DIAGNOSIS — R400 Somnolence: Secondary | ICD-10-CM | POA: Diagnosis not present

## 2024-05-02 DIAGNOSIS — E7800 Pure hypercholesterolemia, unspecified: Secondary | ICD-10-CM | POA: Diagnosis present

## 2024-05-02 DIAGNOSIS — E876 Hypokalemia: Secondary | ICD-10-CM | POA: Diagnosis present

## 2024-05-02 DIAGNOSIS — A419 Sepsis, unspecified organism: Secondary | ICD-10-CM | POA: Diagnosis not present

## 2024-05-02 DIAGNOSIS — F39 Unspecified mood [affective] disorder: Secondary | ICD-10-CM | POA: Diagnosis present

## 2024-05-02 DIAGNOSIS — Z7901 Long term (current) use of anticoagulants: Secondary | ICD-10-CM

## 2024-05-02 DIAGNOSIS — Z6839 Body mass index (BMI) 39.0-39.9, adult: Secondary | ICD-10-CM

## 2024-05-02 DIAGNOSIS — I110 Hypertensive heart disease with heart failure: Secondary | ICD-10-CM | POA: Diagnosis present

## 2024-05-02 DIAGNOSIS — N400 Enlarged prostate without lower urinary tract symptoms: Secondary | ICD-10-CM | POA: Diagnosis present

## 2024-05-02 DIAGNOSIS — I9589 Other hypotension: Secondary | ICD-10-CM | POA: Diagnosis present

## 2024-05-02 DIAGNOSIS — G2581 Restless legs syndrome: Secondary | ICD-10-CM | POA: Diagnosis present

## 2024-05-02 DIAGNOSIS — Z887 Allergy status to serum and vaccine status: Secondary | ICD-10-CM

## 2024-05-02 DIAGNOSIS — M19012 Primary osteoarthritis, left shoulder: Secondary | ICD-10-CM | POA: Diagnosis present

## 2024-05-02 DIAGNOSIS — Z66 Do not resuscitate: Secondary | ICD-10-CM | POA: Diagnosis present

## 2024-05-02 DIAGNOSIS — I480 Paroxysmal atrial fibrillation: Secondary | ICD-10-CM | POA: Diagnosis present

## 2024-05-02 DIAGNOSIS — Z888 Allergy status to other drugs, medicaments and biological substances status: Secondary | ICD-10-CM

## 2024-05-02 DIAGNOSIS — E66812 Obesity, class 2: Secondary | ICD-10-CM | POA: Diagnosis present

## 2024-05-02 DIAGNOSIS — N3000 Acute cystitis without hematuria: Principal | ICD-10-CM | POA: Diagnosis present

## 2024-05-02 DIAGNOSIS — R29810 Facial weakness: Secondary | ICD-10-CM | POA: Diagnosis present

## 2024-05-02 DIAGNOSIS — Z881 Allergy status to other antibiotic agents status: Secondary | ICD-10-CM

## 2024-05-02 DIAGNOSIS — G40909 Epilepsy, unspecified, not intractable, without status epilepticus: Secondary | ICD-10-CM | POA: Diagnosis present

## 2024-05-02 DIAGNOSIS — Z79899 Other long term (current) drug therapy: Secondary | ICD-10-CM

## 2024-05-02 DIAGNOSIS — R531 Weakness: Secondary | ICD-10-CM | POA: Diagnosis not present

## 2024-05-02 DIAGNOSIS — I69351 Hemiplegia and hemiparesis following cerebral infarction affecting right dominant side: Secondary | ICD-10-CM

## 2024-05-02 DIAGNOSIS — Z8249 Family history of ischemic heart disease and other diseases of the circulatory system: Secondary | ICD-10-CM

## 2024-05-02 DIAGNOSIS — Z7985 Long-term (current) use of injectable non-insulin antidiabetic drugs: Secondary | ICD-10-CM

## 2024-05-02 DIAGNOSIS — E1142 Type 2 diabetes mellitus with diabetic polyneuropathy: Secondary | ICD-10-CM

## 2024-05-02 DIAGNOSIS — Z953 Presence of xenogenic heart valve: Secondary | ICD-10-CM

## 2024-05-02 DIAGNOSIS — Z792 Long term (current) use of antibiotics: Secondary | ICD-10-CM

## 2024-05-02 DIAGNOSIS — E1140 Type 2 diabetes mellitus with diabetic neuropathy, unspecified: Secondary | ICD-10-CM | POA: Diagnosis present

## 2024-05-02 DIAGNOSIS — Z794 Long term (current) use of insulin: Secondary | ICD-10-CM

## 2024-05-02 DIAGNOSIS — Z860101 Personal history of adenomatous and serrated colon polyps: Secondary | ICD-10-CM

## 2024-05-02 DIAGNOSIS — I2510 Atherosclerotic heart disease of native coronary artery without angina pectoris: Secondary | ICD-10-CM | POA: Diagnosis present

## 2024-05-02 DIAGNOSIS — L89151 Pressure ulcer of sacral region, stage 1: Secondary | ICD-10-CM | POA: Diagnosis present

## 2024-05-02 DIAGNOSIS — I69354 Hemiplegia and hemiparesis following cerebral infarction affecting left non-dominant side: Secondary | ICD-10-CM

## 2024-05-02 DIAGNOSIS — Z833 Family history of diabetes mellitus: Secondary | ICD-10-CM

## 2024-05-02 DIAGNOSIS — Z1152 Encounter for screening for COVID-19: Secondary | ICD-10-CM

## 2024-05-02 DIAGNOSIS — L89326 Pressure-induced deep tissue damage of left buttock: Secondary | ICD-10-CM | POA: Diagnosis present

## 2024-05-02 DIAGNOSIS — Z87891 Personal history of nicotine dependence: Secondary | ICD-10-CM

## 2024-05-02 DIAGNOSIS — G4733 Obstructive sleep apnea (adult) (pediatric): Secondary | ICD-10-CM | POA: Diagnosis present

## 2024-05-02 DIAGNOSIS — Z96652 Presence of left artificial knee joint: Secondary | ICD-10-CM | POA: Diagnosis present

## 2024-05-02 LAB — I-STAT CHEM 8, ED
BUN: 17 mg/dL (ref 8–23)
Calcium, Ion: 1.1 mmol/L — ABNORMAL LOW (ref 1.15–1.40)
Chloride: 94 mmol/L — ABNORMAL LOW (ref 98–111)
Creatinine, Ser: 1.1 mg/dL (ref 0.61–1.24)
Glucose, Bld: 236 mg/dL — ABNORMAL HIGH (ref 70–99)
HCT: 50 % (ref 39.0–52.0)
Hemoglobin: 17 g/dL (ref 13.0–17.0)
Potassium: 2.7 mmol/L — CL (ref 3.5–5.1)
Sodium: 134 mmol/L — ABNORMAL LOW (ref 135–145)
TCO2: 28 mmol/L (ref 22–32)

## 2024-05-02 LAB — DIFFERENTIAL
Abs Immature Granulocytes: 0.11 K/uL — ABNORMAL HIGH (ref 0.00–0.07)
Basophils Absolute: 0.1 K/uL (ref 0.0–0.1)
Basophils Relative: 0 %
Eosinophils Absolute: 0.2 K/uL (ref 0.0–0.5)
Eosinophils Relative: 1 %
Immature Granulocytes: 1 %
Lymphocytes Relative: 9 %
Lymphs Abs: 1.7 K/uL (ref 0.7–4.0)
Monocytes Absolute: 1.3 K/uL — ABNORMAL HIGH (ref 0.1–1.0)
Monocytes Relative: 7 %
Neutro Abs: 15.4 K/uL — ABNORMAL HIGH (ref 1.7–7.7)
Neutrophils Relative %: 82 %

## 2024-05-02 LAB — CBC
HCT: 50.1 % (ref 39.0–52.0)
HCT: 44.1 % (ref 39.0–52.0)
Hemoglobin: 16.8 g/dL (ref 13.0–17.0)
Hemoglobin: 15 g/dL (ref 13.0–17.0)
MCH: 28.5 pg (ref 26.0–34.0)
MCH: 29.1 pg (ref 26.0–34.0)
MCHC: 33.5 g/dL (ref 30.0–36.0)
MCHC: 34 g/dL (ref 30.0–36.0)
MCV: 84.9 fL (ref 80.0–100.0)
MCV: 85.6 fL (ref 80.0–100.0)
Platelets: 231 K/uL (ref 150–400)
Platelets: 209 K/uL (ref 150–400)
RBC: 5.9 MIL/uL — ABNORMAL HIGH (ref 4.22–5.81)
RBC: 5.15 MIL/uL (ref 4.22–5.81)
RDW: 13.8 % (ref 11.5–15.5)
RDW: 14 % (ref 11.5–15.5)
WBC: 18.7 K/uL — ABNORMAL HIGH (ref 4.0–10.5)
WBC: 18.5 K/uL — ABNORMAL HIGH (ref 4.0–10.5)
nRBC: 0 % (ref 0.0–0.2)
nRBC: 0 % (ref 0.0–0.2)

## 2024-05-02 LAB — COMPREHENSIVE METABOLIC PANEL WITH GFR
ALT: 24 U/L (ref 0–44)
AST: 20 U/L (ref 15–41)
Albumin: 3.5 g/dL (ref 3.5–5.0)
Alkaline Phosphatase: 67 U/L (ref 38–126)
Anion gap: 16 — ABNORMAL HIGH (ref 5–15)
BUN: 16 mg/dL (ref 8–23)
CO2: 25 mmol/L (ref 22–32)
Calcium: 9.3 mg/dL (ref 8.9–10.3)
Chloride: 92 mmol/L — ABNORMAL LOW (ref 98–111)
Creatinine, Ser: 1.1 mg/dL (ref 0.61–1.24)
GFR, Estimated: >60 mL/min (ref >60)
Glucose, Bld: 231 mg/dL — ABNORMAL HIGH (ref 70–99)
Potassium: 2.8 mmol/L — ABNORMAL LOW (ref 3.5–5.1)
Sodium: 133 mmol/L — ABNORMAL LOW (ref 135–145)
Total Bilirubin: 1.4 mg/dL — ABNORMAL HIGH (ref 0.0–1.2)
Total Protein: 7 g/dL (ref 6.5–8.1)

## 2024-05-02 LAB — APTT: aPTT: 29 s (ref 24–36)

## 2024-05-02 LAB — RESP PANEL BY RT-PCR (RSV, FLU A&B, COVID)  RVPGX2
Influenza A by PCR: NEGATIVE
Influenza B by PCR: NEGATIVE
Resp Syncytial Virus by PCR: NEGATIVE
SARS Coronavirus 2 by RT PCR: NEGATIVE

## 2024-05-02 LAB — URINALYSIS, ROUTINE W REFLEX MICROSCOPIC
Bilirubin Urine: NEGATIVE
Glucose, UA: 50 mg/dL — AB
Hgb urine dipstick: NEGATIVE
Ketones, ur: NEGATIVE mg/dL
Nitrite: NEGATIVE
Protein, ur: NEGATIVE mg/dL
Specific Gravity, Urine: 1.013 (ref 1.005–1.030)
pH: 5 (ref 5.0–8.0)

## 2024-05-02 LAB — PROTIME-INR
INR: 1.1 (ref 0.8–1.2)
Prothrombin Time: 14.5 s (ref 11.4–15.2)

## 2024-05-02 LAB — RAPID URINE DRUG SCREEN, HOSP PERFORMED
Amphetamines: NOT DETECTED
Barbiturates: NOT DETECTED
Benzodiazepines: NOT DETECTED
Cocaine: NOT DETECTED
Opiates: POSITIVE — AB
Tetrahydrocannabinol: NOT DETECTED

## 2024-05-02 LAB — MAGNESIUM
Magnesium: 1.7 mg/dL (ref 1.7–2.4)
Magnesium: 1.7 mg/dL (ref 1.7–2.4)

## 2024-05-02 LAB — ETHANOL: Alcohol, Ethyl (B): <15 mg/dL (ref <15)

## 2024-05-02 LAB — CREATININE, SERUM
Creatinine, Ser: 1.18 mg/dL (ref 0.61–1.24)
GFR, Estimated: >60 mL/min (ref >60)

## 2024-05-02 LAB — BRAIN NATRIURETIC PEPTIDE: B Natriuretic Peptide: 41.3 pg/mL (ref 0.0–100.0)

## 2024-05-02 LAB — I-STAT CG4 LACTIC ACID, ED
Lactic Acid, Venous: 2 mmol/L (ref 0.5–1.9)
Lactic Acid, Venous: 2.1 mmol/L (ref 0.5–1.9)

## 2024-05-02 MED ORDER — LACOSAMIDE 50 MG PO TABS
150.0000 mg | ORAL_TABLET | Freq: Two times a day (BID) | ORAL | Status: DC
Start: 2024-05-02 — End: 2024-05-08
  Administered 2024-05-02 – 2024-05-08 (×13): 150 mg via ORAL
  Filled 2024-05-02 (×13): qty 3

## 2024-05-02 MED ORDER — SODIUM CHLORIDE 0.9 % IV SOLN: INTRAVENOUS | Status: AC

## 2024-05-02 MED ORDER — UMECLIDINIUM BROMIDE 62.5 MCG/ACT IN AEPB
1.0000 | INHALATION_SPRAY | Freq: Every day | RESPIRATORY_TRACT | Status: DC
  Administered 2024-05-03 – 2024-05-08 (×6): 1 via RESPIRATORY_TRACT
  Filled 2024-05-02 (×2): qty 7

## 2024-05-02 MED ORDER — HYDROCODONE-ACETAMINOPHEN 5-325 MG PO TABS
1.0000 | ORAL_TABLET | Freq: Four times a day (QID) | ORAL | Status: DC | PRN
Start: 2024-05-02 — End: 2024-05-05
  Administered 2024-05-02 – 2024-05-05 (×7): 1 via ORAL
  Filled 2024-05-02 (×8): qty 1

## 2024-05-02 MED ORDER — SODIUM CHLORIDE 0.9 % IV SOLN
1.0000 g | Freq: Once | INTRAVENOUS | Status: AC
  Administered 2024-05-02: 1 g via INTRAVENOUS
  Filled 2024-05-02: qty 10

## 2024-05-02 MED ORDER — POLYETHYLENE GLYCOL 3350 17 G PO PACK
17.0000 g | PACK | Freq: Every day | ORAL | Status: DC
Start: 2024-05-02 — End: 2024-05-08
  Administered 2024-05-04 – 2024-05-07 (×2): 17 g via ORAL
  Filled 2024-05-02 (×6): qty 1

## 2024-05-02 MED ORDER — ATORVASTATIN CALCIUM 80 MG PO TABS
80.0000 mg | ORAL_TABLET | Freq: Every day | ORAL | Status: DC
  Administered 2024-05-02 – 2024-05-07 (×6): 80 mg via ORAL
  Filled 2024-05-02 (×6): qty 1

## 2024-05-02 MED ORDER — APIXABAN 5 MG PO TABS
5.0000 mg | ORAL_TABLET | Freq: Two times a day (BID) | ORAL | Status: DC
  Administered 2024-05-02 – 2024-05-08 (×12): 5 mg via ORAL
  Filled 2024-05-02 (×12): qty 1

## 2024-05-02 MED ORDER — ROPINIROLE HCL 0.25 MG PO TABS
0.2500 mg | ORAL_TABLET | Freq: Two times a day (BID) | ORAL | Status: DC
  Administered 2024-05-02 – 2024-05-08 (×13): 0.25 mg via ORAL
  Filled 2024-05-02 (×14): qty 1

## 2024-05-02 MED ORDER — VENLAFAXINE HCL ER 75 MG PO CP24
150.0000 mg | ORAL_CAPSULE | Freq: Every day | ORAL | Status: DC
  Administered 2024-05-03 – 2024-05-08 (×6): 150 mg via ORAL
  Filled 2024-05-02 (×5): qty 2

## 2024-05-02 MED ORDER — TRAZODONE HCL 50 MG PO TABS
150.0000 mg | ORAL_TABLET | Freq: Every day | ORAL | Status: DC
  Administered 2024-05-02 – 2024-05-07 (×6): 150 mg via ORAL
  Filled 2024-05-02 (×6): qty 3

## 2024-05-02 MED ORDER — PRAZOSIN HCL 1 MG PO CAPS
1.0000 mg | ORAL_CAPSULE | Freq: Every day | ORAL | Status: DC
Start: 2024-05-02 — End: 2024-05-08
  Administered 2024-05-02 – 2024-05-07 (×6): 1 mg via ORAL
  Filled 2024-05-02 (×7): qty 1

## 2024-05-02 MED ORDER — POTASSIUM CHLORIDE 10 MEQ/100ML IV SOLN
10.0000 meq | INTRAVENOUS | Status: AC
  Administered 2024-05-02 (×2): 10 meq via INTRAVENOUS
  Filled 2024-05-02 (×2): qty 100

## 2024-05-02 MED ORDER — ARIPIPRAZOLE 5 MG PO TABS
5.0000 mg | ORAL_TABLET | Freq: Every day | ORAL | Status: DC
  Administered 2024-05-02 – 2024-05-08 (×7): 5 mg via ORAL
  Filled 2024-05-02 (×7): qty 1

## 2024-05-02 MED ORDER — PREGABALIN 75 MG PO CAPS
150.0000 mg | ORAL_CAPSULE | Freq: Three times a day (TID) | ORAL | Status: DC
  Administered 2024-05-02 – 2024-05-08 (×18): 150 mg via ORAL
  Filled 2024-05-02 (×18): qty 2

## 2024-05-02 MED ORDER — SODIUM CHLORIDE 0.9 % IV SOLN
1.0000 g | INTRAVENOUS | Status: AC
  Administered 2024-05-03 – 2024-05-06 (×4): 1 g via INTRAVENOUS
  Filled 2024-05-02 (×4): qty 10

## 2024-05-02 MED ORDER — SODIUM CHLORIDE 0.9 % IV BOLUS
1000.0000 mL | Freq: Once | INTRAVENOUS | Status: AC
  Administered 2024-05-02: 1000 mL via INTRAVENOUS

## 2024-05-02 MED ORDER — ENOXAPARIN SODIUM 40 MG/0.4ML IJ SOSY
40.0000 mg | PREFILLED_SYRINGE | INTRAMUSCULAR | Status: DC
  Administered 2024-05-02: 40 mg via SUBCUTANEOUS
  Filled 2024-05-02: qty 0.4

## 2024-05-02 MED ORDER — POTASSIUM CHLORIDE CRYS ER 20 MEQ PO TBCR
20.0000 meq | EXTENDED_RELEASE_TABLET | Freq: Once | ORAL | Status: AC
  Administered 2024-05-02: 20 meq via ORAL
  Filled 2024-05-02: qty 1

## 2024-05-02 MED ORDER — POTASSIUM CHLORIDE CRYS ER 20 MEQ PO TBCR
40.0000 meq | EXTENDED_RELEASE_TABLET | Freq: Three times a day (TID) | ORAL | Status: AC
  Administered 2024-05-02 (×3): 40 meq via ORAL
  Filled 2024-05-02 (×3): qty 2

## 2024-05-02 MED ORDER — SPIRONOLACTONE 12.5 MG HALF TABLET
12.5000 mg | ORAL_TABLET | Freq: Every day | ORAL | Status: DC
  Administered 2024-05-02: 12.5 mg via ORAL
  Filled 2024-05-02: qty 1

## 2024-05-02 MED ORDER — MAGNESIUM SULFATE 2 GM/50ML IV SOLN
2.0000 g | Freq: Once | INTRAVENOUS | Status: AC
  Administered 2024-05-02: 2 g via INTRAVENOUS
  Filled 2024-05-02: qty 50

## 2024-05-02 MED ORDER — LEVETIRACETAM 250 MG PO TABS
500.0000 mg | ORAL_TABLET | Freq: Two times a day (BID) | ORAL | Status: DC
  Administered 2024-05-02 – 2024-05-05 (×8): 500 mg via ORAL
  Administered 2024-05-06: 250 mg via ORAL
  Administered 2024-05-06 – 2024-05-08 (×4): 500 mg via ORAL
  Filled 2024-05-02 (×11): qty 2
  Filled 2024-05-02: qty 1
  Filled 2024-05-02: qty 2

## 2024-05-02 MED ORDER — TORSEMIDE 20 MG PO TABS
20.0000 mg | ORAL_TABLET | Freq: Two times a day (BID) | ORAL | Status: DC
Start: 2024-05-02 — End: 2024-05-03
  Administered 2024-05-02: 20 mg via ORAL
  Filled 2024-05-02: qty 1

## 2024-05-02 NOTE — ED Triage Notes (Signed)
 Pt bib ems coming from alf for LT facial droop and RT arm weakness, last known normal 1:30am. Takes thinners, med hx. Afib. Assessed by neuro MD, CT done, labs sent, placed on [place] 2 LT current O2 94, staring O2 89.

## 2024-05-02 NOTE — ED Provider Notes (Signed)
 [place] EMERGENCY DEPARTMENT AT [facility] Provider Note   CSN: [id number] Arrival date & time: 05/02/24  9751  An emergency department physician performed an initial assessment on this suspected stroke patient at 0248.  Patient presents with: Code Stroke   Lawrence [name] is a 68 y.o. male.   HPI     This is a 68 year old male with a previous history of stroke and atrial fibrillation on blood thinners who presents with concern for code stroke.  Per EMS report last seen normal at 130.  Was found by staff to have left-sided deficits and speech difficulty.  No other collateral information provided.  Of note, multiple presentations with similar symptoms in the past with reassuring MRIs.  Prior to Admission medications   Medication Sig Start Date End Date Taking? Authorizing Provider  acetaminophen  (TYLENOL ) 650 MG CR tablet Take 650 mg by mouth every 8 (eight) hours as needed for pain.   Yes [provider]  albuterol  (VENTOLIN  HFA) 108 (90 Base) MCG/ACT inhaler Inhale 2 puffs into the lungs every 6 (six) hours as needed for wheezing or shortness of breath. 04/17/23  Yes Hunsucker, Donnice SAUNDERS, MD  allopurinol  (ZYLOPRIM ) 100 MG tablet Take 1 tablet (100 mg total) by mouth daily. 07/25/23 07/24/24 Yes Elgergawy, Brayton RAMAN, MD  apixaban  (ELIQUIS ) 5 MG TABS tablet Take 1 tablet (5 mg total) by mouth 2 (two) times daily. 04/17/23  Yes Rolan Ezra RAMAN, MD  ARIPiprazole  (ABILIFY ) 5 MG tablet TAKE 1 TABLET BY MOUTH EVERYDAY AT BEDTIME 10/03/23  Yes Mozingo, Regina Nattalie, NP  atorvastatin  (LIPITOR ) 80 MG tablet Take 1 tablet (80 mg total) by mouth at bedtime. 12/20/23  Yes Cherlyn Labella, MD  bisacodyl  (DULCOLAX) 5 MG EC tablet Take 10 mg by mouth daily as needed for moderate constipation.   Yes [provider]  colchicine  0.6 MG tablet Take 1 tablet (0.6 mg total) by mouth daily. 07/12/23  Yes Love, Sharlet RAMAN, PA-C  collagenase (SANTYL) 250 UNIT/GM ointment Apply 1  Application topically daily. Apply to right posterior thigh topically   Yes [provider]  cyanocobalamin  (VITAMIN B12) 1000 MCG tablet Take 1,000 mcg by mouth daily.   Yes [provider]  fenofibrate  (TRICOR ) 145 MG tablet Take 1 tablet (145 mg total) by mouth daily. 04/17/23  Yes Rolan Ezra RAMAN, MD  ferrous sulfate  325 (65 FE) MG tablet Take 325 mg by mouth daily.   Yes [provider]  fluticasone  (FLONASE ) 50 MCG/ACT nasal spray Place 2 sprays into both nostrils daily.    Yes [provider]  guaiFENesin -dextromethorphan (ROBITUSSIN DM) 100-10 MG/5ML syrup Take 5 mLs by mouth every 4 (four) hours as needed for cough. 10/25/23  Yes Rai, Ripudeep K, MD  HUMALOG KWIKPEN 100 UNIT/ML KwikPen Inject 2-15 Units into the skin. 121-150 4 units 151-200 6 units  201-250 8 units  251-300 11 units  301-350 14 units  351-400 18 units 12/11/23  Yes [provider]  HYDROcodone -acetaminophen  (NORCO/VICODIN) 5-325 MG tablet Take 1 tablet by mouth every 6 (six) hours as needed for severe pain (pain score 7-10). 12/08/23  Yes Elgergawy, Brayton RAMAN, MD  INCRUSE ELLIPTA  62.5 MCG/ACT AEPB Inhale 1 puff into the lungs daily. 12/11/23  Yes [provider]  Lacosamide  150 MG TABS Take 1 tablet (150 mg total) by mouth 2 (two) times daily. 10/10/23  Yes Briana Elgin DELENA, MD  levETIRAcetam  (KEPPRA ) 500 MG tablet Take 1 tablet (500 mg total) by mouth 2 (two) times  daily. 05/04/23  Yes Camara, Amadou, MD  loperamide  (IMODIUM ) 2 MG capsule Take 2 mg by mouth daily as needed for diarrhea or loose stools.   Yes [provider]  methocarbamol  (ROBAXIN ) 500 MG tablet Take 500 mg by mouth 3 (three) times daily.   Yes [provider]  metolazone  (ZAROXOLYN ) 2.5 MG tablet Take 2.5 mg by mouth once a week. Every Tuesday. 09/20/23  Yes [provider]  omega-3 acid ethyl esters (LOVAZA ) 1 g capsule Take 2 g by mouth 2 (two) times daily.   Yes [provider]  omeprazole (PRILOSEC) 20 MG capsule Take 20 mg by mouth daily. 05/29/20  Yes [provider]  polyethylene glycol powder (GLYCOLAX /MIRALAX ) 17 GM/SCOOP powder Mix in water as directed and take 1 capful (17 g) by mouth daily. 07/12/23  Yes Love, Sharlet RAMAN, PA-C  Potassium Chloride  ER 20 MEQ TBCR Take 40 mEq by mouth daily. 04/25/24  Yes [provider]  prazosin  (MINIPRESS ) 1 MG capsule Take 1 mg by mouth at bedtime. 08/17/23  Yes [provider]  pregabalin  (LYRICA ) 150 MG capsule Take 1 capsule (150 mg total) by mouth 3 (three) times daily. 10/10/23  Yes Briana Elgin LABOR, MD  PROTEIN PO Take 30 mLs by mouth in the morning and at bedtime.   Yes [provider]  rOPINIRole  (REQUIP ) 0.25 MG tablet Take 1 tablet (0.25 mg total) by mouth 2 (two) times daily. 07/25/23  Yes Elgergawy, Brayton RAMAN, MD  sennosides-docusate sodium  (SENOKOT-S) 8.6-50 MG tablet Take 2 tablets by mouth every 12 (twelve) hours as needed for constipation.   Yes [provider]  spironolactone  (ALDACTONE ) 25 MG tablet Take 12.5 mg by mouth daily. 09/01/23  Yes [provider]  tirzepatide  (MOUNJARO ) 15 MG/0.5ML Pen INJECT 15 MG INTO THE SKIN ONCE A WEEK. Patient taking differently: Inject 15 mg into the skin once a week. Thursday 06/07/23  Yes Motwani, Komal, MD  torsemide  (DEMADEX ) 20 MG tablet Take 1 tablet (20 mg total) by mouth 2 (two) times daily. Patient taking differently: Take 30 mg by mouth 2 (two) times daily. Take 1.5 tablets twice a day 12/08/23  Yes Elgergawy, Brayton RAMAN, MD  traZODone  (DESYREL ) 150 MG tablet Take 1 tablet (150 mg total) by mouth at bedtime. 12/20/23  Yes Akula, Vijaya, MD  venlafaxine  XR (EFFEXOR -XR) 150 MG 24 hr capsule Take 150 mg by mouth daily with breakfast.   Yes [provider]  HM LIDOCAINE  PATCH EX Apply 1 patch topically in the morning. Apply to right foot Patient not taking: Reported on 05/02/2024    [provider]  midodrine   (PROAMATINE ) 5 MG tablet Take 1 tablet (5 mg total) by mouth 3 (three) times daily with meals. Patient not taking: Reported on 05/02/2024 12/08/23   Elgergawy, Brayton RAMAN, MD    Allergies: Niacin and related; Ppd [tuberculin purified protein derivative]; Tubersol [tuberculin, ppd]; Firvanq  [vancomycin ]; and Vibramycin  [doxycycline ]    Review of Systems  Unable to perform ROS: Mental status change    Updated Vital Signs BP 95/75   Pulse 95   Temp 98.6 F (37 C) (Oral)   Resp 20   Ht 1.88 m (6' 2)   Wt (!) 140.8 kg   SpO2 95%   BMI 39.85 kg/m   Physical Exam Vitals and nursing note reviewed.  Constitutional:      Appearance: He is well-developed. He is obese. He is ill-appearing. He is not toxic-appearing.  HENT:     Head:  Normocephalic and atraumatic.     Mouth/Throat:     Mouth: Mucous membranes are dry.  Eyes:     Pupils: Pupils are equal, round, and reactive to light.  Cardiovascular:     Rate and Rhythm: Tachycardia present. Rhythm irregular.     Heart sounds: Normal heart sounds. No murmur heard. Pulmonary:     Effort: Pulmonary effort is normal. No respiratory distress.     Breath sounds: Normal breath sounds. No wheezing.  Abdominal:     General: Bowel sounds are normal.     Palpations: Abdomen is soft.     Tenderness: There is no abdominal tenderness. There is no rebound.  Musculoskeletal:     Cervical back: Neck supple.  Lymphadenopathy:     Cervical: No cervical adenopathy.  Skin:    General: Skin is warm and dry.  Neurological:     Mental Status: He is alert.     Comments: Oriented to self and place but not time, slurred speech, no significant facial droop, follows commands and moves all 4 extremities equally  Psychiatric:        Mood and Affect: Mood normal.     (all labs ordered are listed, but only abnormal results are displayed) Labs Reviewed  CBC - Abnormal; Notable for the following components:      Result Value   WBC 18.7 (*)    RBC 5.90 (*)     All other components within normal limits  DIFFERENTIAL - Abnormal; Notable for the following components:   Neutro Abs 15.4 (*)    Monocytes Absolute 1.3 (*)    Abs Immature Granulocytes 0.11 (*)    All other components within normal limits  COMPREHENSIVE METABOLIC PANEL WITH GFR - Abnormal; Notable for the following components:   Sodium 133 (*)    Potassium 2.8 (*)    Chloride 92 (*)    Glucose, Bld 231 (*)    Total Bilirubin 1.4 (*)    Anion gap 16 (*)    All other components within normal limits  RAPID URINE DRUG SCREEN, HOSP PERFORMED - Abnormal; Notable for the following components:   Opiates POSITIVE (*)    All other components within normal limits  URINALYSIS, ROUTINE W REFLEX MICROSCOPIC - Abnormal; Notable for the following components:   APPearance HAZY (*)    Glucose, UA 50 (*)    Leukocytes,Ua SMALL (*)    Bacteria, UA MANY (*)    All other components within normal limits  I-STAT CHEM 8, ED - Abnormal; Notable for the following components:   Sodium 134 (*)    Potassium 2.7 (*)    Chloride 94 (*)    Glucose, Bld 236 (*)    Calcium , Ion 1.10 (*)    All other components within normal limits  I-STAT CG4 LACTIC ACID, ED - Abnormal; Notable for the following components:   Lactic Acid, Venous 2.0 (*)    All other components within normal limits  I-STAT CG4 LACTIC ACID, ED - Abnormal; Notable for the following components:   Lactic Acid, Venous 2.1 (*)    All other components within normal limits  URINE CULTURE  ETHANOL  PROTIME-INR  APTT  MAGNESIUM     EKG: EKG Interpretation Date/Time:  Thursday May 02 2024 03:38:10 EDT Ventricular Rate:  102 PR Interval:  78 QRS Duration:  127 QT Interval:  423 QTC Calculation: 538 R Axis:   107  Text Interpretation: Sinus tachycardia Multiform ventricular premature complexes Short PR interval Nonspecific intraventricular conduction delay  Inferior infarct, old Abnormal lateral Q waves Anterior infarct, old Confirmed  by Bari Pfeiffer [phone]) on 05/02/2024 3:55:39 AM  Radiology: DG Chest Portable 1 View Result Date: 05/02/2024 EXAM: 1 VIEW XRAY OF THE CHEST 05/02/2024 04:19:00 AM COMPARISON: 04/14/2024 CLINICAL HISTORY: white cough, ams. Pt bib ems coming from alf for LT facial droop and RT arm weakness, last known normal 1:30am. Takes thinners, med hx. Afib. Assessed by neuro MD, CT done, labs sent, placed on [place] 2 LT current O2 94, staring O2 89. FINDINGS: LUNGS AND PLEURA: Mild diffuse increase in interstitial markings. Low lung volumes. No pulmonary edema. No pleural effusion. No pneumothorax. HEART AND MEDIASTINUM: Cardiac enlargement. Status post TAVR. BONES AND SOFT TISSUES: No acute osseous abnormality. IMPRESSION: 1. Mild diffuse interstitial prominence, which may be accentuated by low lung volumes. Findings may reflect pulmonary edema versus atypical inflammation/infection. 2. Cardiac enlargement. Electronically signed by: Waddell Calk MD 05/02/2024 05:22 AM EDT RP Workstation: [id number]   CT HEAD CODE STROKE WO CONTRAST Result Date: 05/02/2024 CLINICAL DATA:  Code stroke. Initial evaluation for acute neuro deficit, stroke suspected. Left facial droop, right arm weakness, slurred speech. EXAM: CT HEAD WITHOUT CONTRAST TECHNIQUE: Contiguous axial images were obtained from the base of the skull through the vertex without intravenous contrast. RADIATION DOSE REDUCTION: This exam was performed according to the departmental dose-optimization program which includes automated exposure control, adjustment of the mA and/or kV according to patient size and/or use of iterative reconstruction technique. COMPARISON:  CT from 04/14/2024. FINDINGS: Brain: Cerebral volume within normal limits. No acute mild chronic microvascular ischemic disease. No acute intracranial hemorrhage. No acute large vessel territory infarct. No mass lesion or midline shift. No hydrocephalus or extra-axial fluid collection. Vascular: No asymmetric  hyperdense vessel. Calcified atherosclerosis present at the skull base. Skull: Scalp soft tissues within normal limits.  Calvarium intact. Sinuses/Orbits: Globes and orbital soft tissues within normal limits. Mild scattered mucosal thickening present about the sphenoid ethmoidal and maxillary sinuses. No significant mastoid effusion. Other: None. ASPECTS [facility] Stroke Program Early CT Score) - Ganglionic level infarction (caudate, lentiform nuclei, internal capsule, insula, M1-M3 cortex): 7 - Supraganglionic infarction (M4-M6 cortex): 3 Total score (0-10 with 10 being normal): 10 IMPRESSION: 1. No acute intracranial abnormality. 2. ASPECTS is 10. 3. Mild chronic microvascular ischemic disease. These results were communicated to Dr. Vanessa at 3:03 am on 05/02/2024 by text page via the [facility] messaging system. Electronically Signed   By: Morene Hoard M.D.   On: 05/02/2024 03:05     .Critical Care  Performed by: Bari Pfeiffer FALCON, MD Authorized by: Bari Pfeiffer FALCON, MD   Critical care provider statement:    Critical care time (minutes):  35   Critical care was necessary to treat or prevent imminent or life-threatening deterioration of the following conditions: hypokalemia, IV antibiotics for UTI.   Critical care was time spent personally by me on the following activities:  Development of treatment plan with patient or surrogate, discussions with consultants, evaluation of patient's response to treatment, examination of patient, ordering and review of laboratory studies, ordering and review of radiographic studies, ordering and performing treatments and interventions, pulse oximetry, re-evaluation of patient's condition and review of old charts    Medications Ordered in the ED  cefTRIAXone  (ROCEPHIN ) 1 g in sodium chloride  0.9 % 100 mL IVPB (has no administration in time range)  potassium chloride  10 mEq in 100 mL IVPB (has no administration in time range)  potassium chloride  SA (KLOR-CON   M) CR tablet  20 mEq (20 mEq Oral Given 05/02/24 0405)                                    Medical Decision Making Amount and/or Complexity of Data Reviewed Labs: ordered. Radiology: ordered.  Risk Prescription drug management.   This patient presents to the ED for concern of CVA, this involves an extensive number of treatment options, and is a complaint that carries with it a high risk of complications and morbidity.  I considered the following differential and admission for this acute, potentially life threatening condition.  The differential diagnosis includes CVA, metabolic derangement, infectious process, encephalopathy  MDM:    This is a 68 year old male who presents as a code stroke.  Known to our stroke service team.  CT head does not show any evidence of bleed or other abnormality.  Labs obtained.  Patient noted to have a significant leukocytosis.  He is afebrile but leukocytosis to 18.  Added on infectious workup including chest x-Lawrence and urinalysis.  Potassium noted to be low at 2.7.  This was replaced.  Other labs at baseline.  Urinalysis does show bacteria and white cells in the urine.  Culture was sent and patient was given IV Rocephin .  Lactate is 2.  No signs or symptoms of severe sepsis or septic shock.  MRI pending per neurology recommendations.  Regardless, given potassium derangement and noted infectious process, will admit for IV antibiotics and IV potassium replacement therapy.  (Labs, imaging, consults)  Labs: I Ordered, and personally interpreted labs.  The pertinent results include: CBC, CMP, urinalysis, UDS, lactate  Imaging Studies ordered: I ordered imaging studies including CT head, chest x-Lawrence, MRI pending I independently visualized and interpreted imaging. I agree with the radiologist interpretation  Additional history obtained from EMS.  External records from outside source obtained and reviewed including prior evaluations and neurology  evaluations  Cardiac Monitoring: The patient was maintained on a cardiac monitor.  If on the cardiac monitor, I personally viewed and interpreted the cardiac monitored which showed an underlying rhythm of: Sinus  Reevaluation: After the interventions noted above, I reevaluated the patient and found that they have :stayed the same  Social Determinants of Health:  lives in assisted living  Disposition: Admit  Co morbidities that complicate the patient evaluation  Past Medical History:  Diagnosis Date   Acquired dilation of ascending aorta and aortic root    40mm by echo 01/2021   Adenomatous colon polyp 2007   Anemia    Anxiety    Asthma    BPH without obstruction/lower urinary tract symptoms 02/22/2017   Chronic diastolic (congestive) heart failure (HCC)    Chronic venous stasis 03/07/2019   COPD (chronic obstructive pulmonary disease) (HCC)    Coronary artery calcification seen on CAT scan    Depression    Diabetic neuropathy (HCC) 09/11/2019   History of colon polyps 08/24/2018   Hypertension    Morbid obesity (HCC)    OSA (obstructive sleep apnea)    Pain due to onychomycosis of toenails of both feet 09/11/2019   Peripheral neuropathy 02/22/2017   Primary osteoarthritis, left shoulder 03/05/2017   PTSD (post-traumatic stress disorder)    Pure hypercholesterolemia    QT prolongation 03/07/2019   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 Wendel and Dr. Lucas   Seizures [facility])    Severe aortic  stenosis    Sinus tachycardia 03/07/2019   Sleep apnea    CPAP   Type 2 diabetes mellitus with vascular disease (HCC) 09/11/2019     Medicines Meds ordered this encounter  Medications   potassium chloride  SA (KLOR-CON  M) CR tablet 20 mEq   cefTRIAXone  (ROCEPHIN ) 1 g in sodium chloride  0.9 % 100 mL IVPB    Antibiotic Indication::   UTI   potassium chloride  10 mEq in 100 mL IVPB    I have reviewed the patients  home medicines and have made adjustments as needed  Problem List / ED Course: Problem List Items Addressed This Visit       Genitourinary   UTI (urinary tract infection) - Primary   Relevant Medications   cefTRIAXone  (ROCEPHIN ) 1 g in sodium chloride  0.9 % 100 mL IVPB     Other   Hypokalemia   Other Visit Diagnoses       Slurred speech                    Final diagnoses:  Acute cystitis without hematuria  Hypokalemia  Slurred speech    ED Discharge Orders     None          Bari Charmaine FALCON, MD 05/02/24 [phone]

## 2024-05-02 NOTE — ED Notes (Signed)
Report given Paige RN

## 2024-05-02 NOTE — ED Notes (Signed)
 Pt cleaned of urine and fecal incontinence. Wounds cleansed. Linens changed, dry chux placed under patient. Pt repositioned onto L side. Tolerated well.

## 2024-05-02 NOTE — Consult Note (Signed)
 NEUROLOGY CONSULT NOTE   Date of service: May 02, 2024 Patient Name: Lawrence [name] MRN:  [id number] DOB:  1956/03/11 Chief Complaint: L facial droop and R arm weakness Requesting Provider: Bari Charmaine FALCON, MD  History of Present Illness  Lawrence [name] is a 68 y.o. male with hx of previous stroke with reported left-sided deficits, who has come in with at least eight previous presentations for unilateral left-sided weakness as well as a couple of episodes of right-sided weakness with at least 10 MRIs performed in workup of these. He presents today with L facial droop, slurred speech and R arm weakness.  He was LSN at 0130 and then found this way by his facility staff.  He is on eliquis  for afibb and took his dose yesterday.  LKW: 0130 Modified rankin score: 4-Needs assistance to walk and tend to bodily needs IV Thrombolysis: not offered, on eliquis  EVT: low suspicion for stroke.  NIHSS components Score: Comment  1a Level of Conscious 0[x]  1[]  2[]  3[]      1b LOC Questions 0[]  1[x]  2[]       1c LOC Commands 0[x]  1[]  2[]       2 Best Gaze 0[x]  1[]  2[]       3 Visual 0[]  1[x]  2[]  3[]      4 Facial Palsy 0[]  1[x]  2[]  3[]      5a Motor Arm - left 0[]  1[]  2[x]  3[]  4[]  UN[]    5b Motor Arm - Right 0[]  1[]  2[x]  3[]  4[]  UN[]    6a Motor Leg - Left 0[]  1[]  2[]  3[x]  4[]  UN[]    6b Motor Leg - Right 0[]  1[]  2[]  3[x]  4[]  UN[]    7 Limb Ataxia 0[x]  1[]  2[]  UN[]      8 Sensory 0[]  1[x]  2[]  UN[]      9 Best Language 0[x]  1[]  2[]  3[]      10 Dysarthria 0[]  1[x]  2[]  UN[]      11 Extinct. and Inattention 0[x]  1[]  2[]       TOTAL: 14      ROS  Unable to ascertain due to drowsiness.  Past History   Past Medical History:  Diagnosis Date   Acquired dilation of ascending aorta and aortic root    40mm by echo 01/2021   Adenomatous colon polyp 2007   Anemia    Anxiety    Asthma    BPH without obstruction/lower urinary tract symptoms 02/22/2017   Chronic diastolic (congestive) heart failure  (HCC)    Chronic venous stasis 03/07/2019   COPD (chronic obstructive pulmonary disease) (HCC)    Coronary artery calcification seen on CAT scan    Depression    Diabetic neuropathy (HCC) 09/11/2019   History of colon polyps 08/24/2018   Hypertension    Morbid obesity (HCC)    OSA (obstructive sleep apnea)    Pain due to onychomycosis of toenails of both feet 09/11/2019   Peripheral neuropathy 02/22/2017   Primary osteoarthritis, left shoulder 03/05/2017   PTSD (post-traumatic stress disorder)    Pure hypercholesterolemia    QT prolongation 03/07/2019   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 Wendel and Dr. Lucas   Seizures [facility]    Severe aortic stenosis    Sinus tachycardia 03/07/2019   Sleep apnea    CPAP   Type 2 diabetes mellitus with vascular disease (HCC) 09/11/2019    Past Surgical History:  Procedure Laterality Date   ENDOVENOUS ABLATION SAPHENOUS VEIN W/ LASER Right 08/20/2020   endovenous laser  ablation right greater saphenous vein by Medford Blade MD    ENDOVENOUS ABLATION SAPHENOUS VEIN W/ LASER Left 11/16/2022   endovenous laser ablation left greater saphenous vein by Medford Blade MD   INTRAOPERATIVE TRANSTHORACIC ECHOCARDIOGRAM N/A 02/07/2023   Procedure: INTRAOPERATIVE TRANSTHORACIC ECHOCARDIOGRAM;  Surgeon: Wendel Lurena POUR, MD;  Location: [facility] INVASIVE CV LAB;  Service: Open Heart Surgery;  Laterality: N/A;   JOINT REPLACEMENT     left knee replacement x 2   KNEE ARTHROSCOPY Bilateral    LEFT HEART CATH AND CORONARY ANGIOGRAPHY N/A 01/17/2018   Procedure: LEFT HEART CATH AND CORONARY ANGIOGRAPHY;  Surgeon: Anner Alm ORN, MD;  Location: [facility] INVASIVE CV LAB;  Service: Cardiovascular;  Laterality: N/A;   PRESSURE SENSOR/CARDIOMEMS N/A 08/26/2021   Procedure: PRESSURE SENSOR/CARDIOMEMS;  Surgeon: Rolan Ezra RAMAN, MD;  Location: [facility] INVASIVE CV LAB;  Service: Cardiovascular;  Laterality: N/A;    RIGHT HEART CATH N/A 08/26/2021   Procedure: RIGHT HEART CATH;  Surgeon: Rolan Ezra RAMAN, MD;  Location: [facility] INVASIVE CV LAB;  Service: Cardiovascular;  Laterality: N/A;   RIGHT HEART CATH AND CORONARY ANGIOGRAPHY N/A 01/26/2023   Procedure: RIGHT HEART CATH AND CORONARY ANGIOGRAPHY;  Surgeon: Wendel Lurena POUR, MD;  Location: [facility] INVASIVE CV LAB;  Service: Cardiovascular;  Laterality: N/A;   TEE WITHOUT CARDIOVERSION N/A 08/26/2021   Procedure: TRANSESOPHAGEAL ECHOCARDIOGRAM (TEE);  Surgeon: Rolan Ezra RAMAN, MD;  Location: [facility] ENDOSCOPY;  Service: Cardiovascular;  Laterality: N/A;   TOOTH EXTRACTION N/A 02/03/2023   Procedure: DENTAL RESTORATION/EXTRACTIONS;  Surgeon: Sheryle Hamilton, DMD;  Location: [facility] OR;  Service: Oral Surgery;  Laterality: N/A;   TRANSCATHETER AORTIC VALVE REPLACEMENT, TRANSFEMORAL Right 02/07/2023   Procedure: Transcatheter Aortic Valve Replacement, Transfemoral;  Surgeon: Wendel Lurena POUR, MD;  Location: [facility] INVASIVE CV LAB;  Service: Open Heart Surgery;  Laterality: Right;   TRANSESOPHAGEAL ECHOCARDIOGRAM (CATH LAB) N/A 12/07/2023   Procedure: TRANSESOPHAGEAL ECHOCARDIOGRAM;  Surgeon: Santo Stanly LABOR, MD;  Location: [facility] INVASIVE CV LAB;  Service: Cardiovascular;  Laterality: N/A;   UMBILICAL HERNIA REPAIR      Family History: Family History  Problem Relation Age of Onset   CAD Maternal Grandfather    Diabetes Other    Diabetes Mellitus II Neg Hx    Colon cancer Neg Hx    Esophageal cancer Neg Hx    Inflammatory bowel disease Neg Hx    Liver disease Neg Hx    Pancreatic cancer Neg Hx    Rectal cancer Neg Hx    Stomach cancer Neg Hx    Sleep apnea Neg Hx     Social History  reports that he quit smoking about 8 years ago. His smoking use included cigarettes. He started smoking about 52 years ago. He has a 44 pack-year smoking history. He has been exposed to tobacco smoke. He has never used smokeless tobacco. He reports current alcohol  use. He reports that he does not  use drugs.  Allergies  Allergen Reactions   Niacin And Related Other (See Comments)    Redness, flushing   Ppd [Tuberculin Purified Protein Derivative]     Unknown rxn (MAR)   Tubersol [Tuberculin, Ppd] Other (See Comments)    Unknown rxn (MAR)   Firvanq  [Vancomycin ] Other (See Comments)    Red man syndrome   Vibramycin  [Doxycycline ] Rash    Medications  No current facility-administered medications for this encounter.  Current Outpatient Medications:    acetaminophen  (TYLENOL ) 650 MG CR tablet, Take 650 mg by mouth every 8 (eight) hours as  needed for pain., Disp: , Rfl:    albuterol  (VENTOLIN  HFA) 108 (90 Base) MCG/ACT inhaler, Inhale 2 puffs into the lungs every 6 (six) hours as needed for wheezing or shortness of breath., Disp: 1 each, Rfl: 6   allopurinol  (ZYLOPRIM ) 100 MG tablet, Take 1 tablet (100 mg total) by mouth daily., Disp: , Rfl:    apixaban  (ELIQUIS ) 5 MG TABS tablet, Take 1 tablet (5 mg total) by mouth 2 (two) times daily., Disp: 180 tablet, Rfl: 3   ARIPiprazole  (ABILIFY ) 5 MG tablet, TAKE 1 TABLET BY MOUTH EVERYDAY AT BEDTIME, Disp: 60 tablet, Rfl: 2   atorvastatin  (LIPITOR ) 80 MG tablet, Take 1 tablet (80 mg total) by mouth at bedtime., Disp: 30 tablet, Rfl: 3   bisacodyl  (DULCOLAX) 5 MG EC tablet, Take 10 mg by mouth daily as needed for moderate constipation., Disp: , Rfl:    busPIRone  (BUSPAR ) 5 MG tablet, Take 1 tablet (5 mg total) by mouth 3 (three) times daily., Disp: 90 tablet, Rfl: 1   cephALEXin  (KEFLEX ) 500 MG capsule, Take 1 capsule (500 mg total) by mouth 4 (four) times daily., Disp: 28 capsule, Rfl: 0   colchicine  0.6 MG tablet, Take 1 tablet (0.6 mg total) by mouth daily., Disp: 30 tablet, Rfl: 0   cyanocobalamin  (VITAMIN B12) 1000 MCG tablet, Take 1,000 mcg by mouth daily., Disp: , Rfl:    fenofibrate  (TRICOR ) 145 MG tablet, Take 1 tablet (145 mg total) by mouth daily., Disp: 90 tablet, Rfl: 3   ferrous sulfate  325 (65 FE) MG tablet, Take 325 mg by mouth  daily., Disp: , Rfl:    fluticasone  (FLONASE ) 50 MCG/ACT nasal spray, Place 2 sprays into both nostrils daily. , Disp: , Rfl:    guaiFENesin -dextromethorphan (ROBITUSSIN DM) 100-10 MG/5ML syrup, Take 5 mLs by mouth every 4 (four) hours as needed for cough. (Patient not taking: Reported on 03/07/2024), Disp: , Rfl:    HUMALOG KWIKPEN 100 UNIT/ML KwikPen, Inject 2-15 Units into the skin. 121-150 2 units 151-200 3 units  201-250 5 units  251-300 8 units  301-350 11 units  351-400 15 units, Disp: , Rfl:    HYDROcodone -acetaminophen  (NORCO/VICODIN) 5-325 MG tablet, Take 1 tablet by mouth every 6 (six) hours as needed for severe pain (pain score 7-10)., Disp: 10 tablet, Rfl: 0   INCRUSE ELLIPTA  62.5 MCG/ACT AEPB, Inhale 1 puff into the lungs daily., Disp: , Rfl:    insulin  aspart (NOVOLOG ) 100 UNIT/ML injection, Inject 0-15 Units into the skin 3 (three) times daily with meals. Sliding scale  CBG 70 - 120: 0 units: CBG 121 - 150: 2 units; CBG 151 - 200: 3 units; CBG 201 - 250: 5 units; CBG 251 - 300: 8 units;CBG 301 - 350: 11 units; CBG 351 - 400: 15 units; CBG > 400 : 15 units and notify MD, Disp: , Rfl:    Lacosamide  150 MG TABS, Take 1 tablet (150 mg total) by mouth 2 (two) times daily., Disp: 10 tablet, Rfl: 0   levETIRAcetam  (KEPPRA ) 500 MG tablet, Take 1 tablet (500 mg total) by mouth 2 (two) times daily., Disp: 180 tablet, Rfl: 3   loperamide  (IMODIUM ) 2 MG capsule, Take 2 mg by mouth daily as needed for diarrhea or loose stools., Disp: , Rfl:    methocarbamol  (ROBAXIN ) 500 MG tablet, Take 500 mg by mouth every 8 (eight) hours as needed for muscle spasms., Disp: , Rfl:    metolazone  (ZAROXOLYN ) 2.5 MG tablet, Take 2.5 mg by mouth once  a week. Every Tuesday., Disp: , Rfl:    midodrine  (PROAMATINE ) 5 MG tablet, Take 1 tablet (5 mg total) by mouth 3 (three) times daily with meals., Disp: , Rfl:    omega-3 acid ethyl esters (LOVAZA ) 1 g capsule, Take 2 g by mouth 2 (two) times daily., Disp: , Rfl:     omeprazole (PRILOSEC) 20 MG capsule, Take 20 mg by mouth daily., Disp: , Rfl:    polyethylene glycol powder (GLYCOLAX /MIRALAX ) 17 GM/SCOOP powder, Mix in water as directed and take 1 capful (17 g) by mouth daily., Disp: 238 g, Rfl: 0   potassium chloride  SA (KLOR-CON  M20) 20 MEQ tablet, Take 1-2 tablets (20-40 mEq total) by mouth See admin instructions. Take 2 tablets in the morning and 1 tablet in the evening (Patient taking differently: Take 20-40 mEq by mouth See admin instructions. Take 40 meq in the morning and 20 meq in the evening.), Disp: , Rfl:    prazosin  (MINIPRESS ) 2 MG capsule, Take 2 mg by mouth at bedtime., Disp: , Rfl:    pregabalin  (LYRICA ) 150 MG capsule, Take 1 capsule (150 mg total) by mouth 3 (three) times daily., Disp: 15 capsule, Rfl: 0   rOPINIRole  (REQUIP ) 0.25 MG tablet, Take 1 tablet (0.25 mg total) by mouth 2 (two) times daily., Disp: , Rfl:    sennosides-docusate sodium  (SENOKOT-S) 8.6-50 MG tablet, Take 2 tablets by mouth in the morning and at bedtime., Disp: , Rfl:    spironolactone  (ALDACTONE ) 25 MG tablet, Take 12.5 mg by mouth daily., Disp: , Rfl:    tirzepatide  (MOUNJARO ) 15 MG/0.5ML Pen, INJECT 15 MG INTO THE SKIN ONCE A WEEK. (Patient taking differently: Inject 15 mg into the skin once a week. Mondays), Disp: 6 mL, Rfl: 0   torsemide  (DEMADEX ) 20 MG tablet, Take 1 tablet (20 mg total) by mouth 2 (two) times daily., Disp: , Rfl:    traZODone  (DESYREL ) 150 MG tablet, Take 1 tablet (150 mg total) by mouth at bedtime., Disp: 30 tablet, Rfl: 0   venlafaxine  XR (EFFEXOR -XR) 150 MG 24 hr capsule, Take 150 mg by mouth daily with breakfast., Disp: , Rfl:   Vitals   There were no vitals filed for this visit.  There is no height or weight on file to calculate BMI.   Physical Exam   General: Laying comfortably in bed; in no acute distress.  HENT: Normal oropharynx and mucosa. Normal external appearance of ears and nose.  Neck: Supple, no pain or tenderness  CV: No  JVD. No peripheral edema.  Pulmonary: Symmetric Chest rise. Normal respiratory effort.  Abdomen: Soft to touch, non-tender.  Ext: No cyanosis, edema, or deformity  Skin: No rash. Normal palpation of skin.   Musculoskeletal: Normal digits and nails by inspection. No clubbing.   Neurologic Examination  Mental status/Cognition: Alert, oriented to self, place, but not to month. Speech/language: slurred, fluent, comprehension intact, object naming intact, repetition intact.  Cranial nerves:   CN II Pupils equal and reactive to light, no VF deficits but diplopia.    CN III,IV,VI EOM intact, no gaze preference or deviation, no nystagmus    CN V normal sensation in V1, V2, and V3 segments bilaterally    CN VII L facial droop that improves when distracted.   CN VIII normal hearing to speech    CN IX & X normal palatal elevation, no uvular deviation    CN XI 5/5 head turn and 5/5 shoulder shrug bilaterally    CN XII midline tongue  protrusion    Motor:  Muscle bulk: normal, tone normal Mvmt Root Nerve  Muscle Right Left Comments  SA C5/6 Ax Deltoid     EF C5/6 Mc Biceps     EE C6/7/8 Rad Triceps     WF C6/7 Med FCR     WE C7/8 PIN ECU     F Ab C8/T1 U ADM/FDI 4 4+   HF L1/2/3 Fem Illopsoas 3 3   KE L2/3/4 Fem Quad     DF L4/5 D Peron Tib Ant 4 4 wheel  PF S1/2 Tibial Grc/Sol 4 4    Sensation:  Light touch Decreased on left   Pin prick    Temperature    Vibration   Proprioception    Coordination/Complex Motor:  - Finger to Nose: unable to do - Heel to shin unable to do - Rapid alternating movement are slowed. - Gait: deferred, he is wheelchair bound at baseline.  Labs/Imaging/Neurodiagnostic studies   CBC: No results for input(s): WBC, NEUTROABS, HGB, HCT, MCV, PLT in the last 168 hours. Basic Metabolic Panel:  Lab Results  Component Value Date   NA 138 04/14/2024   K 3.8 04/14/2024   CO2 29 04/14/2024   GLUCOSE 340 (H) 04/14/2024   BUN 23 04/14/2024    CREATININE 1.40 (H) 04/14/2024   CALCIUM  9.3 04/14/2024   GFRNONAA 58 (L) 04/14/2024   GFRAA 77 05/20/2020   Lipid Panel:  Lab Results  Component Value Date   LDLCALC 107 (H) 12/17/2023   HgbA1c:  Lab Results  Component Value Date   HGBA1C 7.0 (H) 12/18/2023   Urine Drug Screen:     Component Value Date/Time   LABOPIA POSITIVE (A) 03/06/2024 0300   COCAINSCRNUR NONE DETECTED 03/06/2024 0300   LABBENZ NONE DETECTED 03/06/2024 0300   AMPHETMU NONE DETECTED 03/06/2024 0300   THCU NONE DETECTED 03/06/2024 0300   LABBARB NONE DETECTED 03/06/2024 0300    Alcohol  Level     Component Value Date/Time   ETH <15 04/14/2024 1827   INR  Lab Results  Component Value Date   INR 0.9 04/14/2024   APTT  Lab Results  Component Value Date   APTT 29 03/06/2024   AED levels:  Lab Results  Component Value Date   LEVETIRACETA 13.6 12/28/2023    CT Head without contrast(Personally reviewed): CTH was negative for a large hypodensity concerning for a large territory infarct or hyperdensity concerning for an ICH  MRI Brain(Personally reviewed): Pending  ASSESSMENT   Lawrence [name] is a 68 y.o. male with hx of previous stroke with reported left-sided deficits, who has come in with at least eight previous presentations for unilateral left-sided weakness as well as a couple of episodes of right-sided weakness with at least 10 MRIs performed in workup of these. He presents today with L facial droop, slurred speech and R arm weakness. He is somnolent with eyes partially closed.  Episode today seems similar to prior presentations. Will get MRI Brain and if negative, no further workup.  RECOMMENDATIONS  - MRI Brain w/o contrast. ______________________________________________________________________  Plan discussed with patient and with Dr. Bari with the ED team.  Signed, Chett Taniguchi, MD [facility]

## 2024-05-02 NOTE — ED Notes (Signed)
 Patient upset that he is still in the ED. Patient has called out multiple times over the past hour asking if his room is ready yet. Patient informed that once his room is ready he will be let known. Patient says this is ridiculous this is BS. Patient repositioned and told to hang on a little bit longer until the floor gets the room ready.

## 2024-05-02 NOTE — ED Notes (Addendum)
 Called unit x2 to send patient upstairs; no response.

## 2024-05-02 NOTE — ED Notes (Signed)
 Pt soiled, pt was cleaned, new dressing placed on pressure ulcers, linens changed

## 2024-05-02 NOTE — ED Notes (Signed)
 Spoke with Consulting civil engineer; patient ok to go upstairs.

## 2024-05-02 NOTE — Consult Note (Addendum)
 WOC Nurse Consult Note: Reason for Consult: Assess sacrum and perineum. Wound type: PI stage 1 and MASD associated on gluteal cleft. DTPI on L buttock. Pressure Injury POA: Yes  Note: Perineum is intact, use the barrier cream available at clean storage daily and after cleaning.   Coccyx Measurement: 5 cm x 2 cm, no open wound. See image below:    L buttock Measurement:1 cm x 3 cm total Wound bed: open wound 0.5 x 0.5 cm, 90% yellow, 10% red. Drainage (amount, consistency, odor) Scant amount, serous, no odor. Periwound: intact, dark skin peri-wound, non bleachable at 12 o'clock, 2 by 1 cm. See image below:    Dressing procedure/placement/frequency: Cleanse with warm water and saline in the open wound on L buttock. Pat dry the peri-wound skin. Cover both areas with foam dressing, ok lift the foam and reassess every shift. The foam can stay up to 3 days, change sooner if saturated or PRN soiling.  Bed nurse recommendations: - Turn and repositioning per hospital policy - If the pt is able to go in the chair, add a chair pressure retribution pad.  - I added the skin care standing orders for preventing further pressure injuries.  WOC team will not plan to follow further. Please reconsult if further assistance is needed. Thank-you,  Lela Holm RN, CNS, [employer], MSN.  (Phone [phone])

## 2024-05-02 NOTE — H&P (Signed)
 History and Physical    Patient: Lawrence [name] FMW:[id number] DOB: 1955/11/30 DOA: 05/02/2024 DOS: the patient was seen and examined on 05/02/2024 PCP: Katina Pfeiffer, PA-C  Patient coming from: Home  Chief Complaint:  Chief Complaint  Patient presents with   Code Stroke   HPI: Lawrence [name] is a 68 y.o. male with medical history significant of prior CVA w/ L sided weakness, seizure disorder, severe AS s/p TAVR in 02/2023, HFpEF, PAF on Eliquis , COPD, OSA on CPAP chronic venous stasis, gout, BPH, anxiety, morbid obesity, and recent admission in 12/2023 for stroke recrudescence iso Klebsiella UTI p/w R sided weakness c/f recurrent stroke recrudescence iso sepsis 2/2 acute cystitis.  The patient presented with an inability to move the right arm and leg that started last night. The patient reported waking up and being unable to lift or move the right arm, with similar symptoms noted in the right leg. The patient had experienced a stroke in the past but did not recall experiencing similar symptoms before. The patient's left side remained mobile. The patient was residing in a facility at the time of the incident and was unsure if assistance was called, but believed that facility staff checked on them. Subsequently, the ambulance was called, and the patient was transported to the hospital.  In the ED, pt hypotensive, and initially required 4L [place] for unclear reasons as O2 sats wnl entire time in ED. Labs notable for Na 133, K 2.7, Cr 1.1, lactic acid 2, and WBC 18.7. UA positive for LE/bacteria but neg for nitrite. UDS positive for opiates. EDP consulted Neuro who has low suspicion for stroke (MRI brain pending), started IVF and IV abx (CTX) and requested medicine for admission.   Review of Systems: As mentioned in the history of present illness. All other systems reviewed and are negative. Past Medical History:  Diagnosis Date   Acquired dilation of ascending aorta and aortic root    40mm by  echo 01/2021   Adenomatous colon polyp 2007   Anemia    Anxiety    Asthma    BPH without obstruction/lower urinary tract symptoms 02/22/2017   Chronic diastolic (congestive) heart failure (HCC)    Chronic venous stasis 03/07/2019   COPD (chronic obstructive pulmonary disease) (HCC)    Coronary artery calcification seen on CAT scan    Depression    Diabetic neuropathy (HCC) 09/11/2019   History of colon polyps 08/24/2018   Hypertension    Morbid obesity (HCC)    OSA (obstructive sleep apnea)    Pain due to onychomycosis of toenails of both feet 09/11/2019   Peripheral neuropathy 02/22/2017   Primary osteoarthritis, left shoulder 03/05/2017   PTSD (post-traumatic stress disorder)    Pure hypercholesterolemia    QT prolongation 03/07/2019   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 Wendel and Dr. Lucas   Seizures [facility])    Severe aortic stenosis    Sinus tachycardia 03/07/2019   Sleep apnea    CPAP   Type 2 diabetes mellitus with vascular disease (HCC) 09/11/2019   Past Surgical History:  Procedure Laterality Date   ENDOVENOUS ABLATION SAPHENOUS VEIN W/ LASER Right 08/20/2020   endovenous laser ablation right greater saphenous vein by Medford Blade MD    ENDOVENOUS ABLATION SAPHENOUS VEIN W/ LASER Left 11/16/2022   endovenous laser ablation left greater saphenous vein by Medford Blade MD   INTRAOPERATIVE TRANSTHORACIC ECHOCARDIOGRAM N/A 02/07/2023   Procedure: INTRAOPERATIVE TRANSTHORACIC  ECHOCARDIOGRAM;  Surgeon: Thukkani, Arun K, MD;  Location: [facility] INVASIVE CV LAB;  Service: Open Heart Surgery;  Laterality: N/A;   JOINT REPLACEMENT     left knee replacement x 2   KNEE ARTHROSCOPY Bilateral    LEFT HEART CATH AND CORONARY ANGIOGRAPHY N/A 01/17/2018   Procedure: LEFT HEART CATH AND CORONARY ANGIOGRAPHY;  Surgeon: Anner Alm ORN, MD;  Location: [facility] INVASIVE CV LAB;  Service: Cardiovascular;  Laterality: N/A;    PRESSURE SENSOR/CARDIOMEMS N/A 08/26/2021   Procedure: PRESSURE SENSOR/CARDIOMEMS;  Surgeon: Rolan Ezra RAMAN, MD;  Location: [facility] INVASIVE CV LAB;  Service: Cardiovascular;  Laterality: N/A;   RIGHT HEART CATH N/A 08/26/2021   Procedure: RIGHT HEART CATH;  Surgeon: Rolan Ezra RAMAN, MD;  Location: [facility] INVASIVE CV LAB;  Service: Cardiovascular;  Laterality: N/A;   RIGHT HEART CATH AND CORONARY ANGIOGRAPHY N/A 01/26/2023   Procedure: RIGHT HEART CATH AND CORONARY ANGIOGRAPHY;  Surgeon: Wendel Lurena POUR, MD;  Location: [facility] INVASIVE CV LAB;  Service: Cardiovascular;  Laterality: N/A;   TEE WITHOUT CARDIOVERSION N/A 08/26/2021   Procedure: TRANSESOPHAGEAL ECHOCARDIOGRAM (TEE);  Surgeon: Rolan Ezra RAMAN, MD;  Location: [facility] ENDOSCOPY;  Service: Cardiovascular;  Laterality: N/A;   TOOTH EXTRACTION N/A 02/03/2023   Procedure: DENTAL RESTORATION/EXTRACTIONS;  Surgeon: Sheryle Hamilton, DMD;  Location: [facility] OR;  Service: Oral Surgery;  Laterality: N/A;   TRANSCATHETER AORTIC VALVE REPLACEMENT, TRANSFEMORAL Right 02/07/2023   Procedure: Transcatheter Aortic Valve Replacement, Transfemoral;  Surgeon: Wendel Lurena POUR, MD;  Location: [facility] INVASIVE CV LAB;  Service: Open Heart Surgery;  Laterality: Right;   TRANSESOPHAGEAL ECHOCARDIOGRAM (CATH LAB) N/A 12/07/2023   Procedure: TRANSESOPHAGEAL ECHOCARDIOGRAM;  Surgeon: Santo Stanly LABOR, MD;  Location: [facility] INVASIVE CV LAB;  Service: Cardiovascular;  Laterality: N/A;   UMBILICAL HERNIA REPAIR     Social History:  reports that he quit smoking about 8 years ago. His smoking use included cigarettes. He started smoking about 52 years ago. He has a 44 pack-year smoking history. He has been exposed to tobacco smoke. He has never used smokeless tobacco. He reports current alcohol  use. He reports that he does not use drugs.  Allergies  Allergen Reactions   Niacin And Related Other (See Comments)    Redness, flushing   Ppd [Tuberculin Purified Protein Derivative]     Unknown rxn  (MAR)   Tubersol [Tuberculin, Ppd] Other (See Comments)    Unknown rxn (MAR)   Firvanq  [Vancomycin ] Other (See Comments)    Red man syndrome   Vibramycin  [Doxycycline ] Rash    Family History  Problem Relation Age of Onset   CAD Maternal Grandfather    Diabetes Other    Diabetes Mellitus II Neg Hx    Colon cancer Neg Hx    Esophageal cancer Neg Hx    Inflammatory bowel disease Neg Hx    Liver disease Neg Hx    Pancreatic cancer Neg Hx    Rectal cancer Neg Hx    Stomach cancer Neg Hx    Sleep apnea Neg Hx     Prior to Admission medications   Medication Sig Start Date End Date Taking? Authorizing Provider  acetaminophen  (TYLENOL ) 650 MG CR tablet Take 650 mg by mouth every 8 (eight) hours as needed for pain.   Yes [provider]  albuterol  (VENTOLIN  HFA) 108 (90 Base) MCG/ACT inhaler Inhale 2 puffs into the lungs every 6 (six) hours as needed for wheezing or shortness of breath. 04/17/23  Yes Hunsucker, Donnice SAUNDERS, MD  allopurinol  (ZYLOPRIM ) 100 MG  tablet Take 1 tablet (100 mg total) by mouth daily. 07/25/23 07/24/24 Yes Elgergawy, Brayton RAMAN, MD  apixaban  (ELIQUIS ) 5 MG TABS tablet Take 1 tablet (5 mg total) by mouth 2 (two) times daily. 04/17/23  Yes Rolan Ezra RAMAN, MD  ARIPiprazole  (ABILIFY ) 5 MG tablet TAKE 1 TABLET BY MOUTH EVERYDAY AT BEDTIME 10/03/23  Yes Mozingo, Regina Nattalie, NP  atorvastatin  (LIPITOR ) 80 MG tablet Take 1 tablet (80 mg total) by mouth at bedtime. 12/20/23  Yes Cherlyn Labella, MD  bisacodyl  (DULCOLAX) 5 MG EC tablet Take 10 mg by mouth daily as needed for moderate constipation.   Yes [provider]  colchicine  0.6 MG tablet Take 1 tablet (0.6 mg total) by mouth daily. 07/12/23  Yes Love, Sharlet RAMAN, PA-C  collagenase (SANTYL) 250 UNIT/GM ointment Apply 1 Application topically daily. Apply to right posterior thigh topically   Yes [provider]  cyanocobalamin  (VITAMIN B12) 1000 MCG tablet Take 1,000 mcg by mouth daily.   Yes [provider]  fenofibrate  (TRICOR ) 145 MG tablet Take 1 tablet (145 mg total) by mouth daily. 04/17/23  Yes Rolan Ezra RAMAN, MD  ferrous sulfate  325 (65 FE) MG tablet Take 325 mg by mouth daily.   Yes [provider]  fluticasone  (FLONASE ) 50 MCG/ACT nasal spray Place 2 sprays into both nostrils daily.    Yes [provider]  guaiFENesin -dextromethorphan (ROBITUSSIN DM) 100-10 MG/5ML syrup Take 5 mLs by mouth every 4 (four) hours as needed for cough. 10/25/23  Yes Rai, Ripudeep K, MD  HUMALOG KWIKPEN 100 UNIT/ML KwikPen Inject 2-15 Units into the skin. 121-150 4 units 151-200 6 units  201-250 8 units  251-300 11 units  301-350 14 units  351-400 18 units 12/11/23  Yes [provider]  HYDROcodone -acetaminophen  (NORCO/VICODIN) 5-325 MG tablet Take 1 tablet by mouth every 6 (six) hours as needed for severe pain (pain score 7-10). 12/08/23  Yes Elgergawy, Brayton RAMAN, MD  INCRUSE ELLIPTA  62.5 MCG/ACT AEPB Inhale 1 puff into the lungs daily. 12/11/23  Yes [provider]  Lacosamide  150 MG TABS Take 1 tablet (150 mg total) by mouth 2 (two) times daily. 10/10/23  Yes Briana Elgin LABOR, MD  levETIRAcetam  (KEPPRA ) 500 MG tablet Take 1 tablet (500 mg total) by mouth 2 (two) times daily. 05/04/23  Yes Gregg Lek, MD  loperamide  (IMODIUM ) 2 MG capsule Take 2 mg by mouth daily as needed for diarrhea or loose stools.   Yes [provider]  methocarbamol  (ROBAXIN ) 500 MG tablet Take 500 mg by mouth 3 (three) times daily.   Yes [provider]  metolazone  (ZAROXOLYN ) 2.5 MG tablet Take 2.5 mg by mouth once a week. Every Tuesday. 09/20/23  Yes [provider]  omega-3 acid ethyl esters (LOVAZA ) 1 g capsule Take 2 g by mouth 2 (two) times daily.   Yes [provider]  omeprazole (PRILOSEC) 20 MG capsule Take 20 mg by mouth daily. 05/29/20  Yes [provider]  polyethylene glycol powder (GLYCOLAX /MIRALAX ) 17 GM/SCOOP powder Mix in water as  directed and take 1 capful (17 g) by mouth daily. 07/12/23  Yes Love, Sharlet RAMAN, PA-C  Potassium Chloride  ER 20 MEQ TBCR Take 40 mEq by mouth daily. 04/25/24  Yes [provider]  prazosin  (MINIPRESS ) 1 MG capsule Take 1 mg by mouth at bedtime. 08/17/23  Yes [provider]  pregabalin  (LYRICA ) 150 MG capsule Take 1 capsule (150 mg total) by mouth 3 (three) times daily. 10/10/23  Yes  Briana Elgin LABOR, MD  PROTEIN PO Take 30 mLs by mouth in the morning and at bedtime.   Yes [provider]  rOPINIRole  (REQUIP ) 0.25 MG tablet Take 1 tablet (0.25 mg total) by mouth 2 (two) times daily. 07/25/23  Yes Elgergawy, Brayton RAMAN, MD  sennosides-docusate sodium  (SENOKOT-S) 8.6-50 MG tablet Take 2 tablets by mouth every 12 (twelve) hours as needed for constipation.   Yes [provider]  spironolactone  (ALDACTONE ) 25 MG tablet Take 12.5 mg by mouth daily. 09/01/23  Yes [provider]  tirzepatide  (MOUNJARO ) 15 MG/0.5ML Pen INJECT 15 MG INTO THE SKIN ONCE A WEEK. Patient taking differently: Inject 15 mg into the skin once a week. Thursday 06/07/23  Yes Motwani, Komal, MD  torsemide  (DEMADEX ) 20 MG tablet Take 1 tablet (20 mg total) by mouth 2 (two) times daily. Patient taking differently: Take 30 mg by mouth 2 (two) times daily. Take 1.5 tablets twice a day 12/08/23  Yes Elgergawy, Brayton RAMAN, MD  traZODone  (DESYREL ) 150 MG tablet Take 1 tablet (150 mg total) by mouth at bedtime. 12/20/23  Yes Akula, Vijaya, MD  venlafaxine  XR (EFFEXOR -XR) 150 MG 24 hr capsule Take 150 mg by mouth daily with breakfast.   Yes [provider]  HM LIDOCAINE  PATCH EX Apply 1 patch topically in the morning. Apply to right foot Patient not taking: Reported on 05/02/2024    [provider]  midodrine  (PROAMATINE ) 5 MG tablet Take 1 tablet (5 mg total) by mouth 3 (three) times daily with meals. Patient not taking: Reported on 05/02/2024 12/08/23   Elgergawy, Brayton RAMAN, MD    Physical  Exam: Vitals:   05/02/24 0700 05/02/24 0715 05/02/24 0716 05/02/24 0730  BP: 95/75 114/71    Pulse: 95 99 92 91  Resp: 20 13 17 17   Temp:      TempSrc:      SpO2: 95% 97% 97% 98%  Weight:      Height:       General: Alert, oriented x3, resting comfortably in no acute distress Respiratory: Lungs clear to auscultation bilaterally with normal respiratory effort; no w/r/r Cardiovascular: Regular rate and rhythm w/o m/r/g   Data Reviewed:  Lab Results  Component Value Date   WBC 18.5 (H) 05/02/2024   HGB 15.0 05/02/2024   HCT 44.1 05/02/2024   MCV 85.6 05/02/2024   PLT 209 05/02/2024   Lab Results  Component Value Date   GLUCOSE 236 (H) 05/02/2024   CALCIUM  9.3 05/02/2024   NA 134 (L) 05/02/2024   K 2.7 (LL) 05/02/2024   CO2 25 05/02/2024   CL 94 (L) 05/02/2024   BUN 17 05/02/2024   CREATININE 1.18 05/02/2024   Lab Results  Component Value Date   ALT 24 05/02/2024   AST 20 05/02/2024   ALKPHOS 67 05/02/2024   BILITOT 1.4 (H) 05/02/2024   Lab Results  Component Value Date   INR 1.1 05/02/2024   INR 0.9 04/14/2024   INR 1.1 03/06/2024   Radiology: DG Chest Portable 1 View Result Date: 05/02/2024 EXAM: 1 VIEW XRAY OF THE CHEST 05/02/2024 04:19:00 AM COMPARISON: 04/14/2024 CLINICAL HISTORY: white cough, ams. Pt bib ems coming from alf for LT facial droop and RT arm weakness, last known normal 1:30am. Takes thinners, med hx. Afib. Assessed by neuro MD, CT done, labs sent, placed on [place] 2 LT current O2 94, staring O2 89. FINDINGS: LUNGS AND PLEURA: Mild diffuse increase in interstitial markings. Low lung volumes. No pulmonary edema. No  pleural effusion. No pneumothorax. HEART AND MEDIASTINUM: Cardiac enlargement. Status post TAVR. BONES AND SOFT TISSUES: No acute osseous abnormality. IMPRESSION: 1. Mild diffuse interstitial prominence, which may be accentuated by low lung volumes. Findings may reflect pulmonary edema versus atypical inflammation/infection. 2. Cardiac  enlargement. Electronically signed by: Waddell Calk MD 05/02/2024 05:22 AM EDT RP Workstation: [id number]   CT HEAD CODE STROKE WO CONTRAST Result Date: 05/02/2024 CLINICAL DATA:  Code stroke. Initial evaluation for acute neuro deficit, stroke suspected. Left facial droop, right arm weakness, slurred speech. EXAM: CT HEAD WITHOUT CONTRAST TECHNIQUE: Contiguous axial images were obtained from the base of the skull through the vertex without intravenous contrast. RADIATION DOSE REDUCTION: This exam was performed according to the departmental dose-optimization program which includes automated exposure control, adjustment of the mA and/or kV according to patient size and/or use of iterative reconstruction technique. COMPARISON:  CT from 04/14/2024. FINDINGS: Brain: Cerebral volume within normal limits. No acute mild chronic microvascular ischemic disease. No acute intracranial hemorrhage. No acute large vessel territory infarct. No mass lesion or midline shift. No hydrocephalus or extra-axial fluid collection. Vascular: No asymmetric hyperdense vessel. Calcified atherosclerosis present at the skull base. Skull: Scalp soft tissues within normal limits.  Calvarium intact. Sinuses/Orbits: Globes and orbital soft tissues within normal limits. Mild scattered mucosal thickening present about the sphenoid ethmoidal and maxillary sinuses. No significant mastoid effusion. Other: None. ASPECTS [facility] Stroke Program Early CT Score) - Ganglionic level infarction (caudate, lentiform nuclei, internal capsule, insula, M1-M3 cortex): 7 - Supraganglionic infarction (M4-M6 cortex): 3 Total score (0-10 with 10 being normal): 10 IMPRESSION: 1. No acute intracranial abnormality. 2. ASPECTS is 10. 3. Mild chronic microvascular ischemic disease. These results were communicated to Dr. Vanessa at 3:03 am on 05/02/2024 by text page via the [facility] messaging system. Electronically Signed   By: Morene Hoard M.D.   On: 05/02/2024  03:05    Assessment and Plan: 65M  h/o prior CVA w/ L sided weakness, seizure disorder, severe AS s/p TAVR in 02/2023, HFpEF, PAF on Eliquis , COPD, OSA on CPAP chronic venous stasis, gout, BPH, anxiety, morbid obesity, and recent admission in 12/2023 for stroke recrudescence iso Klebsiella UTI p/w R sided weakness c/f recurrent stroke recrudescence iso sepsis 2/2 acute cystitis.  R sided weakness  Presumed stroke recrudescence iso sepsis from UTI -Neuro following; recs: low suspicion; f/u MRI brain WO  Sepsis 2/2 acute cystitis Abnormal UA -IV CTX 1g daily for now -MIVF: NS at 150cc/h for now -F/u urine and blood cultures and transition to PO abx when appropriate  PAF -PTA Eliquis  5mg  BID  HFpEF -PTA atorvastatin , spironolactone  and torsemide   COPD -PTA umeclidinium bromide  inh daily  Seizure -PTA lacosamide  and levetiracetam    Advance Care Planning:   Code Status: Limited: Do not attempt resuscitation (DNR) -DNR-LIMITED -Do Not Intubate/DNI    Consults: Neurology  Family Communication: Wife  Severity of Illness: The appropriate patient status for this patient is INPATIENT. Inpatient status is judged to be reasonable and necessary in order to provide the required intensity of service to ensure the patient's safety. The patient's presenting symptoms, physical exam findings, and initial radiographic and laboratory data in the context of their chronic comorbidities is felt to place them at high risk for further clinical deterioration. Furthermore, it is not anticipated that the patient will be medically stable for discharge from the hospital within 2 midnights of admission.   * I certify that at the point of admission it is my clinical judgment that the  patient will require inpatient hospital care spanning beyond 2 midnights from the point of admission due to high intensity of service, high risk for further deterioration and high frequency of surveillance required.*   ------- I  spent 60 minutes reviewing previous notes, at the bedside counseling/discussing the treatment plan, and performing clinical documentation.   Author: Marsha Ada, MD 05/02/2024 8:03 AM  For on call review [email or web address].

## 2024-05-02 NOTE — ED Notes (Signed)
 Call received from telemetry that pt is having frequent runs of PVCs alarming Vtach on cardiac monitor. Strip printed, MD made aware via secure chat.

## 2024-05-02 NOTE — TOC Initial Note (Signed)
 Transition of Care [facility]) - Initial/Assessment Note    Patient Details  Name: Lawrence [name] MRN: [id number] Date of Birth: [birth year]  Transition of Care [facility]) CM/SW Contact:    Nena LITTIE Coffee, RN Phone Number: 05/02/2024, 5:47 PM  Clinical Narrative:                 Pt is from [facility], states he has been there since February. He is non-ambulatory, uses a wc daily. Pt's plan is to return to [facility].     Barriers to Discharge: Continued Medical Work up   Patient Goals and CMS Choice Patient states their goals for this hospitalization and ongoing recovery are:: Return to [email or web address] Compare Post Acute Care list provided to:: Patient Choice offered to / list presented to : Patient      Expected Discharge Plan and Services                                              Prior Living Arrangements/Services                       Activities of Daily Living      Permission Sought/Granted                  Emotional Assessment              Admission diagnosis:  Hypokalemia [E87.6] Patient Active Problem List   Diagnosis Date Noted   Drowsy 12/17/2023   UTI (urinary tract infection) 12/16/2023   Acute left-sided weakness 12/15/2023   COVID-19 virus infection 11/27/2023   Fever 11/27/2023   Fall from wheelchair 11/27/2023   Hyperlipidemia 11/27/2023   Acute respiratory failure with hypoxia (HCC) 10/20/2023   Influenza A with pneumonia 10/20/2023   Altered mental status 10/05/2023   MDD (major depressive disorder), recurrent severe, without psychosis (HCC) 10/05/2023   Hypoglycemia 10/03/2023   SIRS (systemic inflammatory response syndrome) (HCC) 10/03/2023   History of COPD 10/03/2023   History of seizures 10/03/2023   Multiple falls 07/21/2023   Fall 07/15/2023   Acute metabolic encephalopathy 07/14/2023   Obesity, Class III, BMI 40-49.9 (morbid obesity) 07/14/2023   History of CVA with residual deficit  07/14/2023   Fall at home, initial encounter 07/14/2023   Gout 07/12/2023   Constipation 07/09/2023   Polyneuropathy 07/04/2023   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   Acute kidney injury superimposed on stage 3a chronic kidney disease (HCC) 07/03/2023   Hypotension 07/03/2023   Stage 3a chronic kidney disease (HCC) 06/30/2023   Acute stroke due to occlusion of left cerebellar artery (HCC) 06/28/2023   Type 2 diabetes mellitus with diabetic polyneuropathy, with long-term current use of insulin  (HCC) 06/28/2023   CVA (cerebral vascular accident) (HCC) 06/25/2023   Atrial fibrillation, chronic (HCC) 06/25/2023   Acute CVA (cerebrovascular accident) (HCC) 06/25/2023   S/P TAVR (transcatheter aortic valve replacement) 02/07/2023   Acute respiratory failure with hypoxia and hypercapnia (HCC) 01/23/2023   Nail, injury by, initial encounter 12/09/2022   Heart failure (HCC) 10/27/2022   Gout attack 10/27/2022   CHF (congestive heart failure) (HCC) 10/26/2022   Right sided weakness 05/26/2022   Dysarthria 05/25/2022   Ischemic stroke (HCC) 02/25/2022   Hemiparesis affecting left side as late effect of cerebrovascular accident (CVA) (HCC) 02/25/2022   Durable power of attorney for  healthcare exists  02/25/2022   Depression 08/07/2021   Paronychia of great toe, left 07/07/2021   Stroke-like symptoms 04/27/2021   Ambulatory dysfunction 04/27/2021   Left-sided weakness 02/09/2021   Atrial fibrillation with rapid ventricular response (HCC) 02/08/2021   AF (paroxysmal atrial fibrillation) (HCC) 01/25/2021   COPD (chronic obstructive pulmonary disease) (HCC) 01/25/2021   Aortic stenosis 01/21/2021   Acquired dilation of ascending aorta and aortic root 01/21/2021   Coronary artery disease due to lipid rich plaque    SVT (supraventricular tachycardia)    Seizure disorder (HCC)    Primary hypertension 10/17/2020   Hypokalemia 10/17/2020   Cellulitis of right leg 12/04/2019    Pain due to onychomycosis of toenails of both feet 09/11/2019   Diabetic neuropathy (HCC) 09/11/2019   DM2 (diabetes mellitus, type 2) (HCC) 09/11/2019   Sinus tachycardia 03/07/2019   Chronic venous stasis 03/07/2019   History of anemia 12/26/2018   Anemia 08/24/2018   History of colon polyps 08/24/2018   Do not resuscitate status 08/24/2018   Chronic diastolic CHF (congestive heart failure) (HCC) 08/18/2018   Coronary artery calcification seen on CAT scan    Pure hypercholesterolemia    Chest pain    Shortness of breath    Right foot pain 01/13/2018   Acute on chronic diastolic CHF (congestive heart failure) (HCC) 01/13/2018   Type 2 diabetes mellitus with obesity 01/13/2018   Lower extremity cellulitis 11/07/2017   Primary osteoarthritis, left shoulder 03/05/2017   BPH without obstruction/lower urinary tract symptoms 02/22/2017   Hematuria 02/22/2017   Peripheral neuropathy 02/22/2017   Physical deconditioning 02/22/2017   Class 3 obesity    PTSD (post-traumatic stress disorder)    Anasarca 01/31/2017   Bilateral lower extremity edema 01/31/2017   OSA (obstructive sleep apnea)    PCP:  Katina Pfeiffer, PA-C Pharmacy:   [id number] [place], [place] - [address] AT [facility] OF [facility] & [address] [place] [address] Phone: [phone] Fax: [phone]  [facility]  - [place], [place] - [address] [place] [address] Phone: [phone] Fax: [phone]     Social Drivers of Health (SDOH) Social History: SDOH Screenings   Food Insecurity: No Food Insecurity (12/15/2023)  Housing: High Risk (12/15/2023)  Transportation Needs: No Transportation Needs (12/15/2023)  Recent Concern: Transportation Needs - Unmet Transportation Needs (10/03/2023)  Utilities: Not At Risk (12/15/2023)  Alcohol  Screen: Low Risk  (06/08/2021)  Depression (PHQ2-9): High Risk (02/26/2024)  Financial  Resource Strain: Medium Risk (12/06/2021)   Received from [facility]  Physical Activity: Unknown (12/06/2021)   Received from [facility]  Social Connections: Socially Isolated (12/15/2023)  Stress: No Stress Concern Present (01/21/2022)   Received from [facility]  Recent Concern: Stress - Stress Concern Present (11/19/2021)   Received from [facility]  Tobacco Use: Medium Risk (05/02/2024)   SDOH Interventions:     Readmission Risk Interventions    11/28/2023    2:45 PM 07/24/2023   11:56 AM 05/08/2023    4:37 PM  Readmission Risk Prevention Plan  Transportation Screening Complete Complete Complete  Medication Review Oceanographer) Complete Complete Complete  PCP or Specialist appointment within 3-5 days of discharge Complete Complete Complete  HRI or Home Care Consult Complete Complete Complete  SW Recovery Care/Counseling Consult Complete Complete Complete  Palliative Care Screening Not Applicable Not Applicable Complete  Skilled Nursing Facility Complete Complete Not Applicable

## 2024-05-02 NOTE — ED Notes (Signed)
 Patient transported to MRI

## 2024-05-02 NOTE — Code Documentation (Signed)
 Responded to Code Stroke called on pt at 0239 for L sided facial droop, R sided weakness, and slurred speech, [id number]. Pt arrived at 0248, NIH-14, CT head negative for acute changes. TNK not given-pt on eliquis . Plan: MRI. Please complete VS/neuro checks(including NIH) q2h x 12h, then q4h.

## 2024-05-02 NOTE — Care Management Obs Status (Signed)
 MEDICARE OBSERVATION STATUS NOTIFICATION   Patient Details  Name: Lawrence [name] MRN: [id number] Date of Birth: 1956-04-18   Medicare Observation Status Notification Given:  Yes    Nena LITTIE Coffee, RN 05/02/2024, 5:41 PM

## 2024-05-02 NOTE — ED Notes (Signed)
 Pt brought back to the ED due to the room not being ready.

## 2024-05-02 NOTE — ED Provider Notes (Signed)
 Care transferred to me.  Discussed with Dr. Georgina who will admit.  Of note, when patient is asleep and mouth breathing his sats significantly desatted but he is better when his mouth is closed and he is on the nasal cannula oxygen.  Vital signs are currently stable otherwise.  Admit to the hospitalist service.   Freddi Hamilton, MD 05/02/24 [phone]

## 2024-05-03 ENCOUNTER — Other Ambulatory Visit: Payer: Self-pay

## 2024-05-03 DIAGNOSIS — N3000 Acute cystitis without hematuria: Secondary | ICD-10-CM

## 2024-05-03 DIAGNOSIS — Z953 Presence of xenogenic heart valve: Secondary | ICD-10-CM | POA: Diagnosis not present

## 2024-05-03 DIAGNOSIS — E876 Hypokalemia: Secondary | ICD-10-CM | POA: Diagnosis present

## 2024-05-03 DIAGNOSIS — G2581 Restless legs syndrome: Secondary | ICD-10-CM | POA: Diagnosis present

## 2024-05-03 DIAGNOSIS — E1140 Type 2 diabetes mellitus with diabetic neuropathy, unspecified: Secondary | ICD-10-CM | POA: Diagnosis present

## 2024-05-03 DIAGNOSIS — I480 Paroxysmal atrial fibrillation: Secondary | ICD-10-CM | POA: Diagnosis present

## 2024-05-03 DIAGNOSIS — M19012 Primary osteoarthritis, left shoulder: Secondary | ICD-10-CM | POA: Diagnosis present

## 2024-05-03 DIAGNOSIS — I110 Hypertensive heart disease with heart failure: Secondary | ICD-10-CM | POA: Diagnosis present

## 2024-05-03 DIAGNOSIS — E1142 Type 2 diabetes mellitus with diabetic polyneuropathy: Secondary | ICD-10-CM | POA: Diagnosis not present

## 2024-05-03 DIAGNOSIS — L89326 Pressure-induced deep tissue damage of left buttock: Secondary | ICD-10-CM | POA: Diagnosis present

## 2024-05-03 DIAGNOSIS — Z66 Do not resuscitate: Secondary | ICD-10-CM | POA: Diagnosis present

## 2024-05-03 DIAGNOSIS — F39 Unspecified mood [affective] disorder: Secondary | ICD-10-CM | POA: Diagnosis present

## 2024-05-03 DIAGNOSIS — I69354 Hemiplegia and hemiparesis following cerebral infarction affecting left non-dominant side: Secondary | ICD-10-CM | POA: Diagnosis not present

## 2024-05-03 DIAGNOSIS — L89151 Pressure ulcer of sacral region, stage 1: Secondary | ICD-10-CM | POA: Diagnosis present

## 2024-05-03 DIAGNOSIS — Z96652 Presence of left artificial knee joint: Secondary | ICD-10-CM | POA: Diagnosis present

## 2024-05-03 DIAGNOSIS — E785 Hyperlipidemia, unspecified: Secondary | ICD-10-CM | POA: Diagnosis not present

## 2024-05-03 DIAGNOSIS — N390 Urinary tract infection, site not specified: Secondary | ICD-10-CM | POA: Diagnosis not present

## 2024-05-03 DIAGNOSIS — I9589 Other hypotension: Secondary | ICD-10-CM | POA: Diagnosis present

## 2024-05-03 DIAGNOSIS — R0902 Hypoxemia: Secondary | ICD-10-CM | POA: Diagnosis not present

## 2024-05-03 DIAGNOSIS — Z7401 Bed confinement status: Secondary | ICD-10-CM | POA: Diagnosis not present

## 2024-05-03 DIAGNOSIS — J4489 Other specified chronic obstructive pulmonary disease: Secondary | ICD-10-CM | POA: Diagnosis present

## 2024-05-03 DIAGNOSIS — Z794 Long term (current) use of insulin: Secondary | ICD-10-CM | POA: Diagnosis not present

## 2024-05-03 DIAGNOSIS — R29714 NIHSS score 14: Secondary | ICD-10-CM | POA: Diagnosis present

## 2024-05-03 DIAGNOSIS — A419 Sepsis, unspecified organism: Secondary | ICD-10-CM | POA: Diagnosis present

## 2024-05-03 DIAGNOSIS — I5032 Chronic diastolic (congestive) heart failure: Secondary | ICD-10-CM | POA: Diagnosis present

## 2024-05-03 DIAGNOSIS — I69351 Hemiplegia and hemiparesis following cerebral infarction affecting right dominant side: Secondary | ICD-10-CM | POA: Diagnosis not present

## 2024-05-03 DIAGNOSIS — E7800 Pure hypercholesterolemia, unspecified: Secondary | ICD-10-CM | POA: Diagnosis present

## 2024-05-03 DIAGNOSIS — G40909 Epilepsy, unspecified, not intractable, without status epilepticus: Secondary | ICD-10-CM | POA: Diagnosis present

## 2024-05-03 DIAGNOSIS — Z1152 Encounter for screening for COVID-19: Secondary | ICD-10-CM | POA: Diagnosis not present

## 2024-05-03 LAB — COMPREHENSIVE METABOLIC PANEL WITH GFR
ALT: 17 U/L (ref 0–44)
AST: 15 U/L (ref 15–41)
Albumin: 2.7 g/dL — ABNORMAL LOW (ref 3.5–5.0)
Alkaline Phosphatase: 53 U/L (ref 38–126)
Anion gap: 13 (ref 5–15)
BUN: 13 mg/dL (ref 8–23)
CO2: 22 mmol/L (ref 22–32)
Calcium: 8.6 mg/dL — ABNORMAL LOW (ref 8.9–10.3)
Chloride: 99 mmol/L (ref 98–111)
Creatinine, Ser: 0.95 mg/dL (ref 0.61–1.24)
GFR, Estimated: >60 mL/min (ref >60)
Glucose, Bld: 245 mg/dL — ABNORMAL HIGH (ref 70–99)
Potassium: 3.6 mmol/L (ref 3.5–5.1)
Sodium: 134 mmol/L — ABNORMAL LOW (ref 135–145)
Total Bilirubin: 1.4 mg/dL — ABNORMAL HIGH (ref 0.0–1.2)
Total Protein: 5.8 g/dL — ABNORMAL LOW (ref 6.5–8.1)

## 2024-05-03 LAB — CBC
HCT: 40.5 % (ref 39.0–52.0)
Hemoglobin: 13.7 g/dL (ref 13.0–17.0)
MCH: 28.7 pg (ref 26.0–34.0)
MCHC: 33.8 g/dL (ref 30.0–36.0)
MCV: 84.9 fL (ref 80.0–100.0)
Platelets: 209 K/uL (ref 150–400)
RBC: 4.77 MIL/uL (ref 4.22–5.81)
RDW: 14.1 % (ref 11.5–15.5)
WBC: 15 K/uL — ABNORMAL HIGH (ref 4.0–10.5)
nRBC: 0 % (ref 0.0–0.2)

## 2024-05-03 LAB — GLUCOSE, CAPILLARY
Glucose-Capillary: 183 mg/dL — ABNORMAL HIGH (ref 70–99)
Glucose-Capillary: 226 mg/dL — ABNORMAL HIGH (ref 70–99)
Glucose-Capillary: 241 mg/dL — ABNORMAL HIGH (ref 70–99)
Glucose-Capillary: 186 mg/dL — ABNORMAL HIGH (ref 70–99)
Glucose-Capillary: 264 mg/dL — ABNORMAL HIGH (ref 70–99)

## 2024-05-03 LAB — MAGNESIUM: Magnesium: 1.7 mg/dL (ref 1.7–2.4)

## 2024-05-03 MED ORDER — HYDROXYZINE HCL 10 MG PO TABS
10.0000 mg | ORAL_TABLET | Freq: Once | ORAL | Status: AC
  Administered 2024-05-06: 10 mg via ORAL
  Filled 2024-05-03: qty 1

## 2024-05-03 MED ORDER — FLUTICASONE PROPIONATE 50 MCG/ACT NA SUSP
2.0000 | Freq: Every day | NASAL | Status: DC
  Administered 2024-05-03 – 2024-05-08 (×6): 2 via NASAL
  Filled 2024-05-03 (×2): qty 16

## 2024-05-03 MED ORDER — MIDODRINE HCL 5 MG PO TABS
5.0000 mg | ORAL_TABLET | Freq: Three times a day (TID) | ORAL | Status: DC
Start: 2024-05-03 — End: 2024-05-05
  Administered 2024-05-03 – 2024-05-05 (×6): 5 mg via ORAL
  Filled 2024-05-03 (×6): qty 1

## 2024-05-03 MED ORDER — VITAMIN B-12 1000 MCG PO TABS
1000.0000 ug | ORAL_TABLET | Freq: Every day | ORAL | Status: DC
  Administered 2024-05-03 – 2024-05-08 (×6): 1000 ug via ORAL
  Filled 2024-05-03 (×6): qty 1

## 2024-05-03 MED ORDER — INSULIN ASPART 100 UNIT/ML IJ SOLN
0.0000 [IU] | Freq: Every day | INTRAMUSCULAR | Status: DC
  Administered 2024-05-03: 3 [IU] via SUBCUTANEOUS
  Administered 2024-05-05: 2 [IU] via SUBCUTANEOUS

## 2024-05-03 MED ORDER — INSULIN ASPART 100 UNIT/ML IJ SOLN
0.0000 [IU] | Freq: Three times a day (TID) | INTRAMUSCULAR | Status: DC
  Administered 2024-05-03 – 2024-05-04 (×5): 2 [IU] via SUBCUTANEOUS
  Administered 2024-05-05 (×2): 1 [IU] via SUBCUTANEOUS
  Administered 2024-05-05: 2 [IU] via SUBCUTANEOUS
  Administered 2024-05-06: 1 [IU] via SUBCUTANEOUS
  Administered 2024-05-06 – 2024-05-07 (×2): 2 [IU] via SUBCUTANEOUS
  Administered 2024-05-07: 3 [IU] via SUBCUTANEOUS
  Administered 2024-05-07 – 2024-05-08 (×3): 2 [IU] via SUBCUTANEOUS

## 2024-05-03 MED ORDER — COLCHICINE 0.6 MG PO TABS
0.6000 mg | ORAL_TABLET | Freq: Every day | ORAL | Status: DC
  Administered 2024-05-03 – 2024-05-08 (×6): 0.6 mg via ORAL
  Filled 2024-05-03 (×6): qty 1

## 2024-05-03 MED ORDER — PANTOPRAZOLE SODIUM 40 MG PO TBEC
40.0000 mg | DELAYED_RELEASE_TABLET | Freq: Every day | ORAL | Status: DC
  Administered 2024-05-03 – 2024-05-08 (×6): 40 mg via ORAL
  Filled 2024-05-03 (×6): qty 1

## 2024-05-03 MED ORDER — MIDODRINE HCL 5 MG PO TABS
5.0000 mg | ORAL_TABLET | Freq: Once | ORAL | Status: AC
  Administered 2024-05-03: 5 mg via ORAL
  Filled 2024-05-03: qty 1

## 2024-05-03 MED ORDER — FENOFIBRATE 54 MG PO TABS
54.0000 mg | ORAL_TABLET | Freq: Every day | ORAL | Status: DC
  Administered 2024-05-03 – 2024-05-08 (×6): 54 mg via ORAL
  Filled 2024-05-03 (×7): qty 1

## 2024-05-03 NOTE — Evaluation (Signed)
 Physical Therapy Evaluation Patient Details Name: Lawrence [name] MRN: [id number] DOB: 1956-02-13 Today's Date: 05/03/2024  History of Present Illness  Lawrence [name] is a 68 y.o. male p/w R sided weakness c/f recurrent stroke recrudescence iso sepsis 2/2 acute cystitis. Past medical history significant of prior CVA w/ L sided weakness, seizure disorder, severe AS s/p TAVR in 02/2023, HFpEF, PAF on Eliquis , COPD, OSA on CPAP chronic venous stasis, gout, BPH, anxiety, morbid obesity, and recent admission in 12/2023 for stroke recrudescence iso Klebsiella UTI.  Clinical Impression  Pt presents with admitting diagnosis above. +2 total A to attempt to stand in stedy. Only able to clear hips a few inches. +2 Max A to laterally scoot along EOB. Pt reports that he has been living at [facility] since February and mostly [facility] level. Reports the facility uses sit to stand machine to get him up. Patient will benefit from continued inpatient follow up therapy, <3 hours/day. PT will continue to follow.          If plan is discharge home, recommend the following: Two people to help with walking and/or transfers;A lot of help with bathing/dressing/bathroom;Assistance with cooking/housework;Direct supervision/assist for medications management;Assist for transportation;Help with stairs or ramp for entrance   Can travel by private vehicle   No    Equipment Recommendations None recommended by PT  Recommendations for Other Services       Functional Status Assessment Patient has had a recent decline in their functional status and demonstrates the ability to make significant improvements in function in a reasonable and predictable amount of time.     Precautions / Restrictions Precautions Precautions: Fall Recall of Precautions/Restrictions: Intact Restrictions Weight Bearing Restrictions Per Provider Order: No      Mobility  Bed Mobility Overal bed mobility: Needs Assistance Bed Mobility: Supine to Sit, Sit  to Supine     Supine to sit: Contact guard Sit to supine: Min assist   General bed mobility comments: Mostly CGA however Min A to reposition.    Transfers Overall transfer level: Needs assistance Equipment used: Ambulation equipment used Transfers: Sit to/from Stand Sit to Stand: +2 physical assistance, Total assist, From elevated surface, Via lift equipment, Max assist           General transfer comment: +2 total A to attempt to stand in stedy. Only able to clear hips a few inches. +2 Max A to laterally scoot along EOB. Transfer via Lift Equipment: Stedy  Ambulation/Gait               General Gait Details: Unable at baseline  Acupuncturist Bed    Modified Rankin (Stroke Patients Only)       Balance Overall balance assessment: Needs assistance Sitting-balance support: Bilateral upper extremity supported, Feet supported Sitting balance-Leahy Scale: Good                                       Pertinent Vitals/Pain Pain Assessment Pain Assessment: No/denies pain    Home Living Family/patient expects to be discharged to:: Skilled nursing facility                   Additional Comments: Pt reports that he has been living at [facility] since February and mostly WC level. Reports the facility uses sit to stand machine to  get him up.    Prior Function Prior Level of Function : Needs assist             Mobility Comments: pt reports using stedy with assist from staff to transfer ADLs Comments: staff at [facility] have been assisting pt with bathing and dressing. unable to bathe his upper body without assist since recent CVA     Extremity/Trunk Assessment   Upper Extremity Assessment Upper Extremity Assessment: Generalized weakness    Lower Extremity Assessment Lower Extremity Assessment: Generalized weakness    Cervical / Trunk Assessment Cervical / Trunk Assessment: Kyphotic  Communication    Communication Communication: No apparent difficulties    Cognition Arousal: Alert Behavior During Therapy: WFL for tasks assessed/performed   PT - Cognitive impairments: No apparent impairments                         Following commands: Intact       Cueing Cueing Techniques: Verbal cues, Tactile cues     General Comments General comments (skin integrity, edema, etc.): VSS    Exercises     Assessment/Plan    PT Assessment Patient needs continued PT services  PT Problem List Decreased strength;Decreased range of motion;Decreased activity tolerance;Decreased balance;Decreased mobility;Decreased coordination;Decreased knowledge of use of DME;Decreased knowledge of precautions;Decreased safety awareness;Cardiopulmonary status limiting activity       PT Treatment Interventions DME instruction;Gait training;Stair training;Functional mobility training;Therapeutic activities;Therapeutic exercise;Balance training;Neuromuscular re-education;Cognitive remediation;Patient/family education    PT Goals (Current goals can be found in the Care Plan section)  Acute Rehab PT Goals Patient Stated Goal: to get better PT Goal Formulation: With patient Time For Goal Achievement: 05/17/24 Potential to Achieve Goals: Fair    Frequency Min 1X/week     Co-evaluation               AM-PAC PT 6 Clicks Mobility  Outcome Measure Help needed turning from your back to your side while in a flat bed without using bedrails?: A Little Help needed moving from lying on your back to sitting on the side of a flat bed without using bedrails?: A Little Help needed moving to and from a bed to a chair (including a wheelchair)?: Total Help needed standing up from a chair using your arms (e.g., wheelchair or bedside chair)?: Total Help needed to walk in hospital room?: Total Help needed climbing 3-5 steps with a railing? : Total 6 Click Score: 10    End of Session Equipment Utilized During  Treatment: Gait belt Activity Tolerance: Patient tolerated treatment well Patient left: in bed;with call bell/phone within reach;with bed alarm set;with nursing/sitter in room Nurse Communication: Mobility status PT Visit Diagnosis: Other abnormalities of gait and mobility (R26.89)    Time: 1438-1500 PT Time Calculation (min) (ACUTE ONLY): 22 min   Charges:   PT Evaluation $PT Eval Moderate Complexity: 1 Mod   PT General Charges $$ ACUTE PT VISIT: 1 Visit         Sueellen NOVAK, PT, DPT Acute Rehab Services [phone]   Brooklee Michelin 05/03/2024, 4:41 PM

## 2024-05-03 NOTE — Progress Notes (Addendum)
 NEUROLOGY CONSULT FOLLOW UP NOTE   Date of service: May 03, 2024 Patient Name: Lawrence [name] MRN:  [id number] DOB:  1955-10-29  Interval Hx/subjective   Continues to have left-sided weakness which she states is much longer than typical.  Vitals   Vitals:   05/03/24 0415 05/03/24 0430 05/03/24 0828 05/03/24 1640  BP: 92/70 108/60 (!) 91/58 117/68  Pulse: 89  96 96  Resp: 18  20 20   Temp: 97.8 F (36.6 C)  98 F (36.7 C) 98.5 F (36.9 C)  TempSrc: Oral  Oral Oral  SpO2: 92%  93%   Weight:      Height:         Body mass index is 39.85 kg/m.  Physical Exam   Constitutional: Appears well-developed and well-nourished.  Neurologic Examination    MS: Awake, alert, interactive and appropriate CN: Left facial weakness Motor: He does have left sided weakness Sensory: Reduced on the left  Medications  Current Facility-Administered Medications:    apixaban  (ELIQUIS ) tablet 5 mg, 5 mg, Oral, BID, Georgina Basket, MD, 5 mg at 05/03/24 1016   ARIPiprazole  (ABILIFY ) tablet 5 mg, 5 mg, Oral, Daily, Georgina Basket, MD, 5 mg at 05/03/24 1016   atorvastatin  (LIPITOR ) tablet 80 mg, 80 mg, Oral, QHS, Georgina Basket, MD, 80 mg at 05/02/24 2257   cefTRIAXone  (ROCEPHIN ) 1 g in sodium chloride  0.9 % 100 mL IVPB, 1 g, Intravenous, Q24H, Georgina Basket, MD, Last Rate: 200 mL/hr at 05/03/24 1037, 1 g at 05/03/24 1037   colchicine  tablet 0.6 mg, 0.6 mg, Oral, Daily, Ghimire, Shanker M, MD, 0.6 mg at 05/03/24 1016   cyanocobalamin  (VITAMIN B12) tablet 1,000 mcg, 1,000 mcg, Oral, Daily, Ghimire, Shanker M, MD, 1,000 mcg at 05/03/24 1016   fenofibrate  tablet 54 mg, 54 mg, Oral, Daily, Ghimire, Shanker M, MD, 54 mg at 05/03/24 1316   fluticasone  (FLONASE ) 50 MCG/ACT nasal spray 2 spray, 2 spray, Each Nare, Daily, Ghimire, Shanker M, MD, 2 spray at 05/03/24 1316   HYDROcodone -acetaminophen  (NORCO/VICODIN) 5-325 MG per tablet 1 tablet, 1 tablet, Oral, Q6H PRN, Georgina Basket, MD, 1 tablet at  05/03/24 1640   insulin  aspart (novoLOG ) injection 0-5 Units, 0-5 Units, Subcutaneous, QHS, Opyd, Timothy S, MD   insulin  aspart (novoLOG ) injection 0-6 Units, 0-6 Units, Subcutaneous, TID WC, Opyd, Timothy S, MD, 2 Units at 05/03/24 1637   lacosamide  (VIMPAT ) tablet 150 mg, 150 mg, Oral, BID, Georgina Basket, MD, 150 mg at 05/03/24 1016   levETIRAcetam  (KEPPRA ) tablet 500 mg, 500 mg, Oral, BID, Georgina Basket, MD, 500 mg at 05/03/24 1015   midodrine  (PROAMATINE ) tablet 5 mg, 5 mg, Oral, TID WC, Ghimire, Donalda HERO, MD, 5 mg at 05/03/24 1641   pantoprazole  (PROTONIX ) EC tablet 40 mg, 40 mg, Oral, Daily, Ghimire, Shanker M, MD, 40 mg at 05/03/24 1016   polyethylene glycol (MIRALAX  / GLYCOLAX ) packet 17 g, 17 g, Oral, Daily, Georgina Basket, MD   prazosin  (MINIPRESS ) capsule 1 mg, 1 mg, Oral, QHS, Georgina Basket, MD, 1 mg at 05/02/24 2306   pregabalin  (LYRICA ) capsule 150 mg, 150 mg, Oral, TID, Georgina Basket, MD, 150 mg at 05/03/24 1640   rOPINIRole  (REQUIP ) tablet 0.25 mg, 0.25 mg, Oral, BID, Georgina Basket, MD, 0.25 mg at 05/03/24 1015   traZODone  (DESYREL ) tablet 150 mg, 150 mg, Oral, QHS, Georgina Basket, MD, 150 mg at 05/02/24 2254   umeclidinium bromide  (INCRUSE ELLIPTA ) 62.5 MCG/ACT 1 puff, 1 puff, Inhalation, Daily, Georgina Basket, MD, 1 puff at 05/03/24 [phone]  venlafaxine  XR (EFFEXOR -XR) 24 hr capsule 150 mg, 150 mg, Oral, Q breakfast, Georgina Basket, MD, 150 mg at 05/03/24 1024  Labs and Diagnostic Imaging    Assessment   MOROCCO GIPE is a 68 y.o. male with history of previous stroke and left-sided weakness who comes in with worsening left-sided weakness.  It is possible that this is recrudescence in the setting of his infection(Sepsis secondary to complicated UTI), however given the persistence of his deficits, even though he has been worked up multiple times in the past, I do think that MRI continues to be prudent, I would also add a cervical spine image to this as well.  Recommendations   MRI cervical spine and brain, if negative I would focus on treating the underlying infection and physical therapy. ______________________________________________________________________   Bonney Aisha Seals, MD [facility]

## 2024-05-03 NOTE — [place] FL2 (Signed)
 [place]  MEDICAID FL2 LEVEL OF CARE FORM     IDENTIFICATION  Patient Name: Lawrence [name] Birthdate: 1955-11-09 Sex: male Admission Date (Current Location): 05/02/2024  [facility] and [place] Number:  Producer, television/film/video and Address:  [facility] [address], [place], [place] [address]      Provider Number: [id number]  Attending Physician Name and Address:  Raenelle Donalda HERO, MD  Relative Name and Phone Number:       Current Level of Care: Hospital Recommended Level of Care: Skilled Nursing Facility Prior Approval Number:    Date Approved/Denied:   PASRR Number: [id number]  Discharge Plan: SNF    Current Diagnoses: Patient Active Problem List   Diagnosis Date Noted   Drowsy 12/17/2023   UTI (urinary tract infection) 12/16/2023   Acute left-sided weakness 12/15/2023   COVID-19 virus infection 11/27/2023   Fever 11/27/2023   Fall from wheelchair 11/27/2023   Hyperlipidemia 11/27/2023   Acute respiratory failure with hypoxia (HCC) 10/20/2023   Influenza A with pneumonia 10/20/2023   Altered mental status 10/05/2023   MDD (major depressive disorder), recurrent severe, without psychosis (HCC) 10/05/2023   Hypoglycemia 10/03/2023   SIRS (systemic inflammatory response syndrome) (HCC) 10/03/2023   History of COPD 10/03/2023   History of seizures 10/03/2023   Multiple falls 07/21/2023   Fall 07/15/2023   Acute metabolic encephalopathy 07/14/2023   Obesity, Class III, BMI 40-49.9 (morbid obesity) 07/14/2023   History of CVA with residual deficit 07/14/2023   Fall at home, initial encounter 07/14/2023   Gout 07/12/2023   Constipation 07/09/2023   Polyneuropathy 07/04/2023   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   Acute kidney injury superimposed on stage 3a chronic kidney disease (HCC) 07/03/2023   Hypotension 07/03/2023   Stage 3a chronic kidney disease (HCC) 06/30/2023   Acute stroke due to occlusion of left cerebellar  artery (HCC) 06/28/2023   Type 2 diabetes mellitus with diabetic polyneuropathy, with long-term current use of insulin  (HCC) 06/28/2023   CVA (cerebral vascular accident) (HCC) 06/25/2023   Atrial fibrillation, chronic (HCC) 06/25/2023   Acute CVA (cerebrovascular accident) (HCC) 06/25/2023   S/P TAVR (transcatheter aortic valve replacement) 02/07/2023   Acute respiratory failure with hypoxia and hypercapnia (HCC) 01/23/2023   Nail, injury by, initial encounter 12/09/2022   Heart failure (HCC) 10/27/2022   Gout attack 10/27/2022   CHF (congestive heart failure) (HCC) 10/26/2022   Right sided weakness 05/26/2022   Dysarthria 05/25/2022   Ischemic stroke (HCC) 02/25/2022   Hemiparesis affecting left side as late effect of cerebrovascular accident (CVA) (HCC) 02/25/2022   Durable power of attorney for healthcare exists  02/25/2022   Depression 08/07/2021   Paronychia of great toe, left 07/07/2021   Stroke-like symptoms 04/27/2021   Ambulatory dysfunction 04/27/2021   Left-sided weakness 02/09/2021   Atrial fibrillation with rapid ventricular response (HCC) 02/08/2021   AF (paroxysmal atrial fibrillation) (HCC) 01/25/2021   COPD (chronic obstructive pulmonary disease) (HCC) 01/25/2021   Aortic stenosis 01/21/2021   Acquired dilation of ascending aorta and aortic root 01/21/2021   Coronary artery disease due to lipid rich plaque    SVT (supraventricular tachycardia)    Seizure disorder (HCC)    Primary hypertension 10/17/2020   Hypokalemia 10/17/2020   Cellulitis of right leg 12/04/2019   Pain due to onychomycosis of toenails of both feet 09/11/2019   Diabetic neuropathy (HCC) 09/11/2019   DM2 (diabetes mellitus, type 2) (HCC) 09/11/2019   Sinus tachycardia 03/07/2019   Chronic venous  stasis 03/07/2019   History of anemia 12/26/2018   Anemia 08/24/2018   History of colon polyps 08/24/2018   Do not resuscitate status 08/24/2018   Chronic diastolic CHF (congestive heart failure)  (HCC) 08/18/2018   Coronary artery calcification seen on CAT scan    Pure hypercholesterolemia    Chest pain    Shortness of breath    Right foot pain 01/13/2018   Acute on chronic diastolic CHF (congestive heart failure) (HCC) 01/13/2018   Type 2 diabetes mellitus with obesity 01/13/2018   Lower extremity cellulitis 11/07/2017   Primary osteoarthritis, left shoulder 03/05/2017   BPH without obstruction/lower urinary tract symptoms 02/22/2017   Hematuria 02/22/2017   Peripheral neuropathy 02/22/2017   Physical deconditioning 02/22/2017   Class 3 obesity    PTSD (post-traumatic stress disorder)    Anasarca 01/31/2017   Bilateral lower extremity edema 01/31/2017   OSA (obstructive sleep apnea)     Orientation RESPIRATION BLADDER Height & Weight     Self, Time, Situation, Place  Normal Incontinent Weight: (!) 310 lb 6.5 oz (140.8 kg) Height:  6' 2 (188 cm)  BEHAVIORAL SYMPTOMS/MOOD NEUROLOGICAL BOWEL NUTRITION STATUS      Continent Diet (See dc summary)  AMBULATORY STATUS COMMUNICATION OF NEEDS Skin   Extensive Assist Verbally PU Stage and Appropriate Care, Other (Comment) (Stage I on coccyx; deep tissue injury on heel)                       Personal Care Assistance Level of Assistance  Bathing, Feeding, Dressing Bathing Assistance: Maximum assistance Feeding assistance: Limited assistance Dressing Assistance: Maximum assistance     Functional Limitations Info             SPECIAL CARE FACTORS FREQUENCY                       Contractures Contractures Info: Not present    Additional Factors Info  Code Status, Allergies, Isolation Precautions Code Status Info: DNR Allergies Info: Niacin And Related, Ppd (Tuberculin Purified Protein Derivative), Tubersol (Tuberculin, Ppd), Firvanq  (Vancomycin ), Vibramycin  (Doxycycline )     Isolation Precautions Info: VRE     Current Medications (05/03/2024):  This is the current hospital active medication list Current  Facility-Administered Medications  Medication Dose Route Frequency Provider Last Rate Last Admin   apixaban  (ELIQUIS ) tablet 5 mg  5 mg Oral BID Georgina Basket, MD   5 mg at 05/03/24 1016   ARIPiprazole  (ABILIFY ) tablet 5 mg  5 mg Oral Daily Georgina Basket, MD   5 mg at 05/03/24 1016   atorvastatin  (LIPITOR ) tablet 80 mg  80 mg Oral QHS Georgina Basket, MD   80 mg at 05/02/24 2257   cefTRIAXone  (ROCEPHIN ) 1 g in sodium chloride  0.9 % 100 mL IVPB  1 g Intravenous Q24H Moore, Willie, MD 200 mL/hr at 05/03/24 1037 1 g at 05/03/24 1037   colchicine  tablet 0.6 mg  0.6 mg Oral Daily Raenelle Donalda HERO, MD   0.6 mg at 05/03/24 1016   cyanocobalamin  (VITAMIN B12) tablet 1,000 mcg  1,000 mcg Oral Daily Raenelle Donalda HERO, MD   1,000 mcg at 05/03/24 1016   fenofibrate  tablet 54 mg  54 mg Oral Daily Raenelle Donalda HERO, MD   54 mg at 05/03/24 1316   fluticasone  (FLONASE ) 50 MCG/ACT nasal spray 2 spray  2 spray Each Nare Daily Raenelle Donalda HERO, MD   2 spray at 05/03/24 1316   HYDROcodone -acetaminophen  (NORCO/VICODIN) 5-325 MG  per tablet 1 tablet  1 tablet Oral Q6H PRN Georgina Basket, MD   1 tablet at 05/03/24 1016   insulin  aspart (novoLOG ) injection 0-5 Units  0-5 Units Subcutaneous QHS Opyd, Timothy S, MD       insulin  aspart (novoLOG ) injection 0-6 Units  0-6 Units Subcutaneous TID WC Opyd, Timothy S, MD   2 Units at 05/03/24 1318   lacosamide  (VIMPAT ) tablet 150 mg  150 mg Oral BID Georgina Basket, MD   150 mg at 05/03/24 1016   levETIRAcetam  (KEPPRA ) tablet 500 mg  500 mg Oral BID Georgina Basket, MD   500 mg at 05/03/24 1015   midodrine  (PROAMATINE ) tablet 5 mg  5 mg Oral TID WC Raenelle Donalda HERO, MD   5 mg at 05/03/24 1155   pantoprazole  (PROTONIX ) EC tablet 40 mg  40 mg Oral Daily Ghimire, Shanker M, MD   40 mg at 05/03/24 1016   polyethylene glycol (MIRALAX  / GLYCOLAX ) packet 17 g  17 g Oral Daily Georgina Basket, MD       prazosin  (MINIPRESS ) capsule 1 mg  1 mg Oral QHS Georgina Basket, MD   1 mg at 05/02/24  2306   pregabalin  (LYRICA ) capsule 150 mg  150 mg Oral TID Georgina Basket, MD   150 mg at 05/03/24 1016   rOPINIRole  (REQUIP ) tablet 0.25 mg  0.25 mg Oral BID Georgina Basket, MD   0.25 mg at 05/03/24 1015   traZODone  (DESYREL ) tablet 150 mg  150 mg Oral QHS Georgina Basket, MD   150 mg at 05/02/24 2254   umeclidinium bromide  (INCRUSE ELLIPTA ) 62.5 MCG/ACT 1 puff  1 puff Inhalation Daily Georgina Basket, MD   1 puff at 05/03/24 0827   venlafaxine  XR (EFFEXOR -XR) 24 hr capsule 150 mg  150 mg Oral Q breakfast Moore, Willie, MD   150 mg at 05/03/24 1024     Discharge Medications: Please see discharge summary for a list of discharge medications.  Relevant Imaging Results:  Relevant Lab Results:   Additional Information SS#: [id number]  Quint Chestnut S Carmie Lanpher, LCSW

## 2024-05-03 NOTE — Plan of Care (Signed)
   Problem: Education: Goal: Knowledge of General Education information will improve Description: Including pain rating scale, medication(s)/side effects and non-pharmacologic comfort measures Outcome: Progressing   Problem: Nutrition: Goal: Adequate nutrition will be maintained Outcome: Progressing   Problem: Coping: Goal: Level of anxiety will decrease Outcome: Progressing

## 2024-05-03 NOTE — Progress Notes (Signed)
 PROGRESS NOTE        PATIENT DETAILS Name: Lawrence [name] Age: 68 y.o. Sex: male Date of Birth: [birth year] Admit Date: 05/02/2024 Admitting Physician Marsha Ada, MD ERE:Tyj[place], [place], [place]  Brief Summary: Patient is a 68 y.o.  male with  history of CVA with residual left-sided weakness, seizure disorder, chronic HFpEF, A-fib on Eliquis , AAS-s/p TAVR in July 2024, COPD, OSA on CPAP-who presented to the ED-after being found on the floor by SNF staff-he was brought to the ED where he was noted to have left-sided facial droop/right-sided weakness-leukocytosis-UA suspicious for UTI-he was thought to have recrudescence of strokelike symptoms due to possible UTI (similar multiple instances in the past)   Significant events: 9/25>> admit to [facility]  Significant studies: 7/31>> echo: EF 55-60% 9/25>> CT head: No acute intracranial abnormality 9/25>> CXR: Mild interstitial prominence.  Significant microbiology data: 9/25>> COVID/influenza/RSV PCR: Negative 9/25>> urine culture: Pending 9/26>> blood culture: Pending  Procedures: None  Consults: Neurology  Subjective: Restless/delirious last night-but relatively awake/alert this morning.  Claims he feels better.  Seems to be moving all 4 extremities-more on the right compared to the left.  Objective: Vitals: Blood pressure (!) 91/58, pulse 96, temperature 97.8 F (36.6 C), temperature source Oral, resp. rate (!) 21, height 6' 2 (1.88 m), weight (!) 140.8 kg, SpO2 93%.   Exam: Gen Exam:Alert awake-not in any distress HEENT:atraumatic, normocephalic Chest: B/L clear to auscultation anteriorly CVS:S1S2 regular Abdomen:soft non tender, non distended Extremities:no edema Neurology: Appears to have slight left-sided weakness-probably at baseline. Skin: no rash  Pertinent Labs/Radiology:    Latest Ref Rng & Units 05/03/2024    7:59 AM 05/02/2024    7:50 AM 05/02/2024    3:19 AM  CBC  WBC 4.0 - 10.5  K/uL 15.0  18.5    Hemoglobin 13.0 - 17.0 g/dL 86.2  84.9  82.9   Hematocrit 39.0 - 52.0 % 40.5  44.1  50.0   Platelets 150 - 400 K/uL 209  209      Lab Results  Component Value Date   NA 134 (L) 05/03/2024   K 3.6 05/03/2024   CL 99 05/03/2024   CO2 22 05/03/2024     Assessment/Plan: Sepsis secondary to complicated UTI Sepsis physiology improving Blood pressure remains on the softer side-but he is asymptomatic. Will restart midodrine  at previous home dose. Continue IV antibiotics-await culture results.  Right-sided weakness Seems to have improved overnight Given prior history of similar instances of weakness with infectious issues-it is currently felt that this is probably recrudescence of CVA rather than acute CVA. Awaiting MRI brain.  If +ve for infarct-then will initiate further workup.  PAF Telemetry monitoring Eliquis   Chronic HFpEF Volume status stable Hold diuretics as BP soft Reassess on 9/27 to see if this can be resumed.  Seizure disorder Keppra /Vimpat   RLS Requip   Peripheral neuropathy Neurontin .  COPD Stable Not in exacerbation Continue bronchodilators  DM-2 (A1c 7.0 on 5/12) CBG stable SSI  Recent Labs    05/03/24 0053 05/03/24 0809  GLUCAP 183* 226*     HLD Statin/fenofibrate   Mood disorder Restless but otherwise stable this morning Continue Abilify /Effexor /trazodone   Pressure Ulcer: Agree with assessment as outlined below. Wound 05/02/24 1000 Pressure Injury Coccyx Medial Stage 1 -  Intact skin with non-blanchable redness of a localized area usually over a bony prominence. (Active)  Wound 05/02/24 1116 Pressure Injury Buttocks Left Deep Tissue Pressure Injury - Purple or maroon localized area of discolored intact skin or blood-filled blister due to damage of underlying soft tissue from pressure and/or shear. (Active)    Class 1 Obesity: Estimated body mass index is 39.85 kg/m as calculated from the following:   Height as  of this encounter: 6' 2 (1.88 m).   Weight as of this encounter: 140.8 kg.   Code status:   Code Status: Limited: Do not attempt resuscitation (DNR) -DNR-LIMITED -Do Not Intubate/DNI    DVT Prophylaxis: apixaban  (ELIQUIS ) tablet 5 mg    Family Communication: None at bedside   Disposition Plan: Status is: Observation The patient will require care spanning > 2 midnights and should be moved to inpatient because: Severity of illness   Planned Discharge Destination:Skilled nursing facility   Diet: Diet Order             Diet regular Room service appropriate? Yes; Fluid consistency: Thin  Diet effective now                     Antimicrobial agents: Anti-infectives (From admission, onward)    Start     Dose/Rate Route Frequency Ordered Stop   05/03/24 1000  cefTRIAXone  (ROCEPHIN ) 1 g in sodium chloride  0.9 % 100 mL IVPB        1 g 200 mL/hr over 30 Minutes Intravenous Every 24 hours 05/02/24 1414 05/07/24 0959   05/02/24 0630  cefTRIAXone  (ROCEPHIN ) 1 g in sodium chloride  0.9 % 100 mL IVPB        1 g 200 mL/hr over 30 Minutes Intravenous  Once 05/02/24 0617 05/02/24 0854        MEDICATIONS: Scheduled Meds:  apixaban   5 mg Oral BID   ARIPiprazole   5 mg Oral Daily   atorvastatin   80 mg Oral QHS   insulin  aspart  0-5 Units Subcutaneous QHS   insulin  aspart  0-6 Units Subcutaneous TID WC   lacosamide   150 mg Oral BID   levETIRAcetam   500 mg Oral BID   polyethylene glycol  17 g Oral Daily   prazosin   1 mg Oral QHS   pregabalin   150 mg Oral TID   rOPINIRole   0.25 mg Oral BID   spironolactone   12.5 mg Oral Daily   torsemide   20 mg Oral BID   traZODone   150 mg Oral QHS   umeclidinium bromide   1 puff Inhalation Daily   venlafaxine  XR  150 mg Oral Q breakfast   Continuous Infusions:  cefTRIAXone  (ROCEPHIN )  IV     PRN Meds:.HYDROcodone -acetaminophen    I have personally reviewed following labs and imaging studies  LABORATORY DATA: CBC: Recent Labs  Lab  05/02/24 0318 05/02/24 0319 05/02/24 0750 05/03/24 0759  WBC 18.7*  --  18.5* 15.0*  NEUTROABS 15.4*  --   --   --   HGB 16.8 17.0 15.0 13.7  HCT 50.1 50.0 44.1 40.5  MCV 84.9  --  85.6 84.9  PLT 231  --  209 209    Basic Metabolic Panel: Recent Labs  Lab 05/02/24 0318 05/02/24 0319 05/02/24 0750 05/03/24 0759  NA 133* 134*  --  134*  K 2.8* 2.7*  --  3.6  CL 92* 94*  --  99  CO2 25  --   --  22  GLUCOSE 231* 236*  --  245*  BUN 16 17  --  13  CREATININE 1.10 1.10 1.18 0.95  CALCIUM  9.3  --   --  8.6*  MG  --   --  1.7  1.7 1.7    GFR: Estimated Creatinine Clearance: 112.7 mL/min (by C-G formula based on SCr of 0.95 mg/dL).  Liver Function Tests: Recent Labs  Lab 05/02/24 0318 05/03/24 0759  AST 20 15  ALT 24 17  ALKPHOS 67 53  BILITOT 1.4* 1.4*  PROT 7.0 5.8*  ALBUMIN 3.5 2.7*   No results for input(s): LIPASE, AMYLASE in the last 168 hours. No results for input(s): AMMONIA in the last 168 hours.  Coagulation Profile: Recent Labs  Lab 05/02/24 0318  INR 1.1    Cardiac Enzymes: No results for input(s): CKTOTAL, CKMB, CKMBINDEX, TROPONINI in the last 168 hours.  BNP (last 3 results) No results for input(s): PROBNP in the last 8760 hours.  Lipid Profile: No results for input(s): CHOL, HDL, LDLCALC, TRIG, CHOLHDL, LDLDIRECT in the last 72 hours.  Thyroid  Function Tests: No results for input(s): TSH, T4TOTAL, FREET4, T3FREE, THYROIDAB in the last 72 hours.  Anemia Panel: No results for input(s): VITAMINB12, FOLATE, FERRITIN, TIBC, IRON, RETICCTPCT in the last 72 hours.  Urine analysis:    Component Value Date/Time   COLORURINE YELLOW 05/02/2024 0505   APPEARANCEUR HAZY (A) 05/02/2024 0505   LABSPEC 1.013 05/02/2024 0505   PHURINE 5.0 05/02/2024 0505   GLUCOSEU 50 (A) 05/02/2024 0505   HGBUR NEGATIVE 05/02/2024 0505   BILIRUBINUR NEGATIVE 05/02/2024 0505   KETONESUR NEGATIVE 05/02/2024 0505    PROTEINUR NEGATIVE 05/02/2024 0505   NITRITE NEGATIVE 05/02/2024 0505   LEUKOCYTESUR SMALL (A) 05/02/2024 0505    Sepsis Labs: Lactic Acid, Venous    Component Value Date/Time   LATICACIDVEN 2.1 (HH) 05/02/2024 0626    MICROBIOLOGY: Recent Results (from the past 240 hours)  Resp panel by RT-PCR (RSV, Flu A&B, Covid) Anterior Nasal Swab     Status: None   Collection Time: 05/02/24  7:26 AM   Specimen: Anterior Nasal Swab  Result Value Ref Range Status   SARS Coronavirus 2 by RT PCR NEGATIVE NEGATIVE Final   Influenza A by PCR NEGATIVE NEGATIVE Final   Influenza B by PCR NEGATIVE NEGATIVE Final    Comment: (NOTE) The Xpert Xpress SARS-CoV-2/FLU/RSV plus assay is intended as an aid in the diagnosis of influenza from Nasopharyngeal swab specimens and should not be used as a sole basis for treatment. Nasal washings and aspirates are unacceptable for Xpert Xpress SARS-CoV-2/FLU/RSV testing.  Fact Sheet for Patients: BloggerCourse.com  Fact Sheet for Healthcare Providers: SeriousBroker.it  This test is not yet approved or cleared by the [place]  FDA and has been authorized for detection and/or diagnosis of SARS-CoV-2 by FDA under an Emergency Use Authorization (EUA). This EUA will remain in effect (meaning this test can be used) for the duration of the COVID-19 declaration under Section 564(b)(1) of the Act, 21 U.S.C. section 360bbb-3(b)(1), unless the authorization is terminated or revoked.     Resp Syncytial Virus by PCR NEGATIVE NEGATIVE Final    Comment: (NOTE) Fact Sheet for Patients: BloggerCourse.com  Fact Sheet for Healthcare Providers: SeriousBroker.it  This test is not yet approved or cleared by the [place]  FDA and has been authorized for detection and/or diagnosis of SARS-CoV-2 by FDA under an Emergency Use Authorization (EUA). This EUA will  remain in effect (meaning this test can be used) for the duration of the COVID-19 declaration under Section 564(b)(1) of the Act, 21 U.S.C. section 360bbb-3(b)(1), unless the authorization is terminated or  revoked.  Performed at [facility], [address], [place], [place] [address]     RADIOLOGY STUDIES/RESULTS: DG Chest Portable 1 View Result Date: 05/02/2024 EXAM: 1 VIEW XRAY OF THE CHEST 05/02/2024 04:19:00 AM COMPARISON: 04/14/2024 CLINICAL HISTORY: white cough, ams. Pt bib ems coming from alf for LT facial droop and RT arm weakness, last known normal 1:30am. Takes thinners, med hx. Afib. Assessed by neuro MD, CT done, labs sent, placed on  2 LT current O2 94, staring O2 89. FINDINGS: LUNGS AND PLEURA: Mild diffuse increase in interstitial markings. Low lung volumes. No pulmonary edema. No pleural effusion. No pneumothorax. HEART AND MEDIASTINUM: Cardiac enlargement. Status post TAVR. BONES AND SOFT TISSUES: No acute osseous abnormality. IMPRESSION: 1. Mild diffuse interstitial prominence, which may be accentuated by low lung volumes. Findings may reflect pulmonary edema versus atypical inflammation/infection. 2. Cardiac enlargement. Electronically signed by: Waddell Calk MD 05/02/2024 05:22 AM EDT RP Workstation: [id number]   CT HEAD CODE STROKE WO CONTRAST Result Date: 05/02/2024 CLINICAL DATA:  Code stroke. Initial evaluation for acute neuro deficit, stroke suspected. Left facial droop, right arm weakness, slurred speech. EXAM: CT HEAD WITHOUT CONTRAST TECHNIQUE: Contiguous axial images were obtained from the base of the skull through the vertex without intravenous contrast. RADIATION DOSE REDUCTION: This exam was performed according to the departmental dose-optimization program which includes automated exposure control, adjustment of the mA and/or kV according to patient size and/or use of iterative reconstruction technique. COMPARISON:  CT from 04/14/2024. FINDINGS: Brain:  Cerebral volume within normal limits. No acute mild chronic microvascular ischemic disease. No acute intracranial hemorrhage. No acute large vessel territory infarct. No mass lesion or midline shift. No hydrocephalus or extra-axial fluid collection. Vascular: No asymmetric hyperdense vessel. Calcified atherosclerosis present at the skull base. Skull: Scalp soft tissues within normal limits.  Calvarium intact. Sinuses/Orbits: Globes and orbital soft tissues within normal limits. Mild scattered mucosal thickening present about the sphenoid ethmoidal and maxillary sinuses. No significant mastoid effusion. Other: None. ASPECTS [facility] Stroke Program Early CT Score) - Ganglionic level infarction (caudate, lentiform nuclei, internal capsule, insula, M1-M3 cortex): 7 - Supraganglionic infarction (M4-M6 cortex): 3 Total score (0-10 with 10 being normal): 10 IMPRESSION: 1. No acute intracranial abnormality. 2. ASPECTS is 10. 3. Mild chronic microvascular ischemic disease. These results were communicated to Dr. Vanessa at 3:03 am on 05/02/2024 by text page via the [facility] messaging system. Electronically Signed   By: Morene Hoard M.D.   On: 05/02/2024 03:05     LOS: 0 days   Donalda Applebaum, MD  [facility]    To contact the attending provider between 7A-7P or the covering provider during after hours 7P-7A, please log into the web site [email or web address] and access using universal [place] password for that web site. If you do not have the password, please call the hospital operator.  05/03/2024, 8:50 AM

## 2024-05-03 NOTE — Plan of Care (Signed)
  Problem: Clinical Measurements: Goal: Ability to maintain clinical measurements within normal limits will improve Outcome: Progressing Goal: Will remain free from infection Outcome: Progressing Goal: Diagnostic test results will improve Outcome: Progressing Goal: Respiratory complications will improve Outcome: Progressing Goal: Cardiovascular complication will be avoided Outcome: Progressing   Problem: Activity: Goal: Risk for activity intolerance will decrease Outcome: Progressing   Problem: Nutrition: Goal: Adequate nutrition will be maintained Outcome: Progressing   Problem: Elimination: Goal: Will not experience complications related to bowel motility Outcome: Progressing Goal: Will not experience complications related to urinary retention Outcome: Progressing   Problem: Pain Managment: Goal: General experience of comfort will improve and/or be controlled Outcome: Progressing   Problem: Safety: Goal: Ability to remain free from injury will improve Outcome: Progressing   Problem: Skin Integrity: Goal: Risk for impaired skin integrity will decrease Outcome: Progressing   Problem: Fluid Volume: Goal: Ability to maintain a balanced intake and output will improve Outcome: Progressing   Problem: Metabolic: Goal: Ability to maintain appropriate glucose levels will improve Outcome: Progressing   Problem: Tissue Perfusion: Goal: Adequacy of tissue perfusion will improve Outcome: Progressing

## 2024-05-03 NOTE — Progress Notes (Signed)
 PT Cancellation Note  Patient Details Name: Lawrence [name] MRN: [id number] DOB: Mar 16, 1956   Cancelled Treatment:    Reason Eval/Treat Not Completed: Patient at procedure or test/unavailable (Pt being taken off the floor by transport. Will follow up later if time allows.)   [name] 05/03/2024, 9:18 AM

## 2024-05-04 DIAGNOSIS — E876 Hypokalemia: Secondary | ICD-10-CM | POA: Diagnosis not present

## 2024-05-04 DIAGNOSIS — N3000 Acute cystitis without hematuria: Secondary | ICD-10-CM | POA: Diagnosis not present

## 2024-05-04 DIAGNOSIS — E785 Hyperlipidemia, unspecified: Secondary | ICD-10-CM | POA: Diagnosis not present

## 2024-05-04 DIAGNOSIS — A419 Sepsis, unspecified organism: Secondary | ICD-10-CM | POA: Diagnosis not present

## 2024-05-04 LAB — CBC
HCT: 37.9 % — ABNORMAL LOW (ref 39.0–52.0)
Hemoglobin: 12.7 g/dL — ABNORMAL LOW (ref 13.0–17.0)
MCH: 28.5 pg (ref 26.0–34.0)
MCHC: 33.5 g/dL (ref 30.0–36.0)
MCV: 85.2 fL (ref 80.0–100.0)
Platelets: 196 K/uL (ref 150–400)
RBC: 4.45 MIL/uL (ref 4.22–5.81)
RDW: 13.9 % (ref 11.5–15.5)
WBC: 14.3 K/uL — ABNORMAL HIGH (ref 4.0–10.5)
nRBC: 0 % (ref 0.0–0.2)

## 2024-05-04 LAB — URINE CULTURE: Culture: >=100000 — AB

## 2024-05-04 LAB — BASIC METABOLIC PANEL WITH GFR
Anion gap: 11 (ref 5–15)
BUN: 11 mg/dL (ref 8–23)
CO2: 22 mmol/L (ref 22–32)
Calcium: 8.7 mg/dL — ABNORMAL LOW (ref 8.9–10.3)
Chloride: 102 mmol/L (ref 98–111)
Creatinine, Ser: 0.91 mg/dL (ref 0.61–1.24)
GFR, Estimated: >60 mL/min (ref >60)
Glucose, Bld: 185 mg/dL — ABNORMAL HIGH (ref 70–99)
Potassium: 2.9 mmol/L — ABNORMAL LOW (ref 3.5–5.1)
Sodium: 135 mmol/L (ref 135–145)

## 2024-05-04 LAB — GLUCOSE, CAPILLARY
Glucose-Capillary: 178 mg/dL — ABNORMAL HIGH (ref 70–99)
Glucose-Capillary: 235 mg/dL — ABNORMAL HIGH (ref 70–99)
Glucose-Capillary: 241 mg/dL — ABNORMAL HIGH (ref 70–99)
Glucose-Capillary: 160 mg/dL — ABNORMAL HIGH (ref 70–99)

## 2024-05-04 MED ORDER — POTASSIUM CHLORIDE CRYS ER 20 MEQ PO TBCR
40.0000 meq | EXTENDED_RELEASE_TABLET | Freq: Four times a day (QID) | ORAL | Status: AC
  Administered 2024-05-04 (×2): 40 meq via ORAL
  Filled 2024-05-04 (×2): qty 2

## 2024-05-04 NOTE — Progress Notes (Signed)
 PROGRESS NOTE        PATIENT DETAILS Name: Lawrence [name] Age: 68 y.o. Sex: male Date of Birth: [birth year] Admit Date: 05/02/2024 Admitting Physician Marsha Ada, MD ERE:[place], [place], [place]  Brief Summary: Patient is a 68 y.o.  male with  history of CVA with residual left-sided weakness, seizure disorder, chronic HFpEF, A-fib on Eliquis , AAS-s/p TAVR in July 2024, COPD, OSA on CPAP-who presented to the ED-after being found on the floor by SNF staff-he was brought to the ED where he was noted to have left-sided facial droop/right-sided weakness-leukocytosis-UA suspicious for UTI-he was thought to have recrudescence of strokelike symptoms due to possible UTI (similar multiple instances in the past)   Significant events: 9/25>> admit to [facility]  Significant studies: 7/31>> echo: EF 55-60% 9/25>> CT head: No acute intracranial abnormality 9/25>> CXR: Mild interstitial prominence.  Significant microbiology data: 9/25>> COVID/influenza/RSV PCR: Negative 9/25>> urine culture: Klebsiella/group B strep. 9/26>> blood culture: No growth  Procedures: None  Consults: Neurology  Subjective: Does not feel good but seems to be moving his right side better today compared to yesterday.  No other issues-friend at bedside with a dog.  Objective: Vitals: Blood pressure 107/68, pulse 85, temperature 98.4 F (36.9 C), temperature source Oral, resp. rate 14, height 6' 2 (1.88 m), weight (!) 140.8 kg, SpO2 92%.   Exam: Awake/alert Chest: Clear to auscultation CVS: S1-S2 regular Abdomen: Soft nontender nondistended Extremity: No edema Neurology: Appears to have chronic mild left-sided weakness at baseline-moving right side extremities more fluently today.    Pertinent Labs/Radiology:    Latest Ref Rng & Units 05/04/2024    2:45 AM 05/03/2024    7:59 AM 05/02/2024    7:50 AM  CBC  WBC 4.0 - 10.5 K/uL 14.3  15.0  18.5   Hemoglobin 13.0 - 17.0 g/dL 87.2  86.2   84.9   Hematocrit 39.0 - 52.0 % 37.9  40.5  44.1   Platelets 150 - 400 K/uL 196  209  209     Lab Results  Component Value Date   NA 135 05/04/2024   K 2.9 (L) 05/04/2024   CL 102 05/04/2024   CO2 22 05/04/2024     Assessment/Plan: Sepsis secondary to complicated UTI Sepsis physiology improved Afebrile-leukocytosis downtrending Continue IV Rocephin   Right-sided weakness Continues to improve Given prior history of similar instances of weakness with infectious issues-it is currently felt that this is probably recrudescence of CVA rather than acute CVA. Awaiting MRI brain/C-spine-has a stimulator in place that needs to be fully charged before MRI can be performed. In the interim-mobilize with PT/OT.  PAF Telemetry monitoring Eliquis   Chronic HFpEF Volume status stable Hold diuretics as BP soft Reassess on 9/28 to see if this can be resumed.  Chronic hypotension BP soft but stable Remains on midodrine .  Hypokalemia Replete/recheck.  Seizure disorder Keppra /Vimpat   RLS Requip   Peripheral neuropathy Neurontin .  COPD Stable Not in exacerbation Continue bronchodilators  DM-2 (A1c 7.0 on 5/12) CBG stable SSI  Recent Labs    05/03/24 1646 05/03/24 2138 05/04/24 0801  GLUCAP 186* 264* 178*     HLD Statin/fenofibrate   Mood disorder Restless but otherwise stable this morning Continue Abilify /Effexor /trazodone   Pressure Ulcer: Agree with assessment as outlined below. Wound 05/02/24 1000 Pressure Injury Coccyx Medial Stage 1 -  Intact skin with non-blanchable redness of a localized area usually  over a bony prominence. (Active)     Wound 05/02/24 1116 Pressure Injury Buttocks Left Deep Tissue Pressure Injury - Purple or maroon localized area of discolored intact skin or blood-filled blister due to damage of underlying soft tissue from pressure and/or shear. (Active)    Class 1 Obesity: Estimated body mass index is 39.85 kg/m as calculated from the  following:   Height as of this encounter: 6' 2 (1.88 m).   Weight as of this encounter: 140.8 kg.   Code status:   Code Status: Limited: Do not attempt resuscitation (DNR) -DNR-LIMITED -Do Not Intubate/DNI    DVT Prophylaxis: apixaban  (ELIQUIS ) tablet 5 mg    Family Communication: Friend at bedside   Disposition Plan: Status is: Observation The patient will require care spanning > 2 midnights and should be moved to inpatient because: Severity of illness   Planned Discharge Destination:Skilled nursing facility   Diet: Diet Order             Diet regular Room service appropriate? Yes; Fluid consistency: Thin  Diet effective now                     Antimicrobial agents: Anti-infectives (From admission, onward)    Start     Dose/Rate Route Frequency Ordered Stop   05/03/24 1000  cefTRIAXone  (ROCEPHIN ) 1 g in sodium chloride  0.9 % 100 mL IVPB        1 g 200 mL/hr over 30 Minutes Intravenous Every 24 hours 05/02/24 1414 05/07/24 0959   05/02/24 0630  cefTRIAXone  (ROCEPHIN ) 1 g in sodium chloride  0.9 % 100 mL IVPB        1 g 200 mL/hr over 30 Minutes Intravenous  Once 05/02/24 0617 05/02/24 0854        MEDICATIONS: Scheduled Meds:  apixaban   5 mg Oral BID   ARIPiprazole   5 mg Oral Daily   atorvastatin   80 mg Oral QHS   colchicine   0.6 mg Oral Daily   cyanocobalamin   1,000 mcg Oral Daily   fenofibrate   54 mg Oral Daily   fluticasone   2 spray Each Nare Daily   hydrOXYzine   10 mg Oral Once   insulin  aspart  0-5 Units Subcutaneous QHS   insulin  aspart  0-6 Units Subcutaneous TID WC   lacosamide   150 mg Oral BID   levETIRAcetam   500 mg Oral BID   midodrine   5 mg Oral TID WC   pantoprazole   40 mg Oral Daily   polyethylene glycol  17 g Oral Daily   potassium chloride   40 mEq Oral Q6H   prazosin   1 mg Oral QHS   pregabalin   150 mg Oral TID   rOPINIRole   0.25 mg Oral BID   traZODone   150 mg Oral QHS   umeclidinium bromide   1 puff Inhalation Daily   venlafaxine   XR  150 mg Oral Q breakfast   Continuous Infusions:  cefTRIAXone  (ROCEPHIN )  IV 1 g (05/04/24 0921)   PRN Meds:.HYDROcodone -acetaminophen    I have personally reviewed following labs and imaging studies  LABORATORY DATA: CBC: Recent Labs  Lab 05/02/24 0318 05/02/24 0319 05/02/24 0750 05/03/24 0759 05/04/24 0245  WBC 18.7*  --  18.5* 15.0* 14.3*  NEUTROABS 15.4*  --   --   --   --   HGB 16.8 17.0 15.0 13.7 12.7*  HCT 50.1 50.0 44.1 40.5 37.9*  MCV 84.9  --  85.6 84.9 85.2  PLT 231  --  209 209 196  Basic Metabolic Panel: Recent Labs  Lab 05/02/24 0318 05/02/24 0319 05/02/24 0750 05/03/24 0759 05/04/24 0245  NA 133* 134*  --  134* 135  K 2.8* 2.7*  --  3.6 2.9*  CL 92* 94*  --  99 102  CO2 25  --   --  22 22  GLUCOSE 231* 236*  --  245* 185*  BUN 16 17  --  13 11  CREATININE 1.10 1.10 1.18 0.95 0.91  CALCIUM  9.3  --   --  8.6* 8.7*  MG  --   --  1.7  1.7 1.7  --     GFR: Estimated Creatinine Clearance: 117.7 mL/min (by C-G formula based on SCr of 0.91 mg/dL).  Liver Function Tests: Recent Labs  Lab 05/02/24 0318 05/03/24 0759  AST 20 15  ALT 24 17  ALKPHOS 67 53  BILITOT 1.4* 1.4*  PROT 7.0 5.8*  ALBUMIN 3.5 2.7*   No results for input(s): LIPASE, AMYLASE in the last 168 hours. No results for input(s): AMMONIA in the last 168 hours.  Coagulation Profile: Recent Labs  Lab 05/02/24 0318  INR 1.1    Cardiac Enzymes: No results for input(s): CKTOTAL, CKMB, CKMBINDEX, TROPONINI in the last 168 hours.  BNP (last 3 results) No results for input(s): PROBNP in the last 8760 hours.  Lipid Profile: No results for input(s): CHOL, HDL, LDLCALC, TRIG, CHOLHDL, LDLDIRECT in the last 72 hours.  Thyroid  Function Tests: No results for input(s): TSH, T4TOTAL, FREET4, T3FREE, THYROIDAB in the last 72 hours.  Anemia Panel: No results for input(s): VITAMINB12, FOLATE, FERRITIN, TIBC, IRON, RETICCTPCT in  the last 72 hours.  Urine analysis:    Component Value Date/Time   COLORURINE YELLOW 05/02/2024 0505   APPEARANCEUR HAZY (A) 05/02/2024 0505   LABSPEC 1.013 05/02/2024 0505   PHURINE 5.0 05/02/2024 0505   GLUCOSEU 50 (A) 05/02/2024 0505   HGBUR NEGATIVE 05/02/2024 0505   BILIRUBINUR NEGATIVE 05/02/2024 0505   KETONESUR NEGATIVE 05/02/2024 0505   PROTEINUR NEGATIVE 05/02/2024 0505   NITRITE NEGATIVE 05/02/2024 0505   LEUKOCYTESUR SMALL (A) 05/02/2024 0505    Sepsis Labs: Lactic Acid, Venous    Component Value Date/Time   LATICACIDVEN 2.1 (HH) 05/02/2024 0626    MICROBIOLOGY: Recent Results (from the past 240 hours)  Urine Culture     Status: Abnormal   Collection Time: 05/02/24  5:05 AM   Specimen: Urine, Catheterized  Result Value Ref Range Status   Specimen Description URINE, CATHETERIZED  Final   Special Requests NONE  Final   Culture (A)  Final    >=100,000 COLONIES/mL KLEBSIELLA PNEUMONIAE 20,000 COLONIES/mL GROUP B STREP(S.AGALACTIAE)ISOLATED TESTING AGAINST S. AGALACTIAE NOT ROUTINELY PERFORMED DUE TO PREDICTABILITY OF AMP/PEN/VAN SUSCEPTIBILITY. Performed at [facility], [address], [place], [place] [address]    Report Status 05/04/2024 FINAL  Final   Organism ID, Bacteria KLEBSIELLA PNEUMONIAE (A)  Final      Susceptibility   Klebsiella pneumoniae - MIC*    AMPICILLIN  >=32 RESISTANT Resistant     CEFAZOLIN  (URINE) Value in next row Sensitive      2 SENSITIVEThis is a modified FDA-approved test that has been validated and its performance characteristics determined by the reporting laboratory.  This laboratory is certified under the Clinical Laboratory Improvement Amendments CLIA as qualified to perform high complexity clinical laboratory testing.    CEFEPIME  Value in next row Sensitive      2 SENSITIVEThis is a modified FDA-approved test that has been validated  and its performance characteristics determined by the reporting laboratory.  This laboratory  is certified under the Clinical Laboratory Improvement Amendments CLIA as qualified to perform high complexity clinical laboratory testing.    ERTAPENEM Value in next row Sensitive      2 SENSITIVEThis is a modified FDA-approved test that has been validated and its performance characteristics determined by the reporting laboratory.  This laboratory is certified under the Clinical Laboratory Improvement Amendments CLIA as qualified to perform high complexity clinical laboratory testing.    CEFTRIAXONE  Value in next row Sensitive      2 SENSITIVEThis is a modified FDA-approved test that has been validated and its performance characteristics determined by the reporting laboratory.  This laboratory is certified under the Clinical Laboratory Improvement Amendments CLIA as qualified to perform high complexity clinical laboratory testing.    CIPROFLOXACIN Value in next row Sensitive      2 SENSITIVEThis is a modified FDA-approved test that has been validated and its performance characteristics determined by the reporting laboratory.  This laboratory is certified under the Clinical Laboratory Improvement Amendments CLIA as qualified to perform high complexity clinical laboratory testing.    GENTAMICIN Value in next row Sensitive      2 SENSITIVEThis is a modified FDA-approved test that has been validated and its performance characteristics determined by the reporting laboratory.  This laboratory is certified under the Clinical Laboratory Improvement Amendments CLIA as qualified to perform high complexity clinical laboratory testing.    NITROFURANTOIN Value in next row Intermediate      2 SENSITIVEThis is a modified FDA-approved test that has been validated and its performance characteristics determined by the reporting laboratory.  This laboratory is certified under the Clinical Laboratory Improvement Amendments CLIA as qualified to perform high complexity clinical laboratory testing.    TRIMETH /SULFA  Value in  next row Sensitive      2 SENSITIVEThis is a modified FDA-approved test that has been validated and its performance characteristics determined by the reporting laboratory.  This laboratory is certified under the Clinical Laboratory Improvement Amendments CLIA as qualified to perform high complexity clinical laboratory testing.    AMPICILLIN /SULBACTAM Value in next row Sensitive      2 SENSITIVEThis is a modified FDA-approved test that has been validated and its performance characteristics determined by the reporting laboratory.  This laboratory is certified under the Clinical Laboratory Improvement Amendments CLIA as qualified to perform high complexity clinical laboratory testing.    PIP/TAZO Value in next row Sensitive      <=4 SENSITIVEThis is a modified FDA-approved test that has been validated and its performance characteristics determined by the reporting laboratory.  This laboratory is certified under the Clinical Laboratory Improvement Amendments CLIA as qualified to perform high complexity clinical laboratory testing.    MEROPENEM Value in next row Sensitive      <=4 SENSITIVEThis is a modified FDA-approved test that has been validated and its performance characteristics determined by the reporting laboratory.  This laboratory is certified under the Clinical Laboratory Improvement Amendments CLIA as qualified to perform high complexity clinical laboratory testing.    * >=100,000 COLONIES/mL KLEBSIELLA PNEUMONIAE  Resp panel by RT-PCR (RSV, Flu A&B, Covid) Anterior Nasal Swab     Status: None   Collection Time: 05/02/24  7:26 AM   Specimen: Anterior Nasal Swab  Result Value Ref Range Status   SARS Coronavirus 2 by RT PCR NEGATIVE NEGATIVE Final   Influenza A by PCR NEGATIVE NEGATIVE Final   Influenza B by PCR NEGATIVE  NEGATIVE Final    Comment: (NOTE) The Xpert Xpress SARS-CoV-2/FLU/RSV plus assay is intended as an aid in the diagnosis of influenza from Nasopharyngeal swab specimens  and should not be used as a sole basis for treatment. Nasal washings and aspirates are unacceptable for Xpert Xpress SARS-CoV-2/FLU/RSV testing.  Fact Sheet for Patients: BloggerCourse.com  Fact Sheet for Healthcare Providers: SeriousBroker.it  This test is not yet approved or cleared by the [place]  FDA and has been authorized for detection and/or diagnosis of SARS-CoV-2 by FDA under an Emergency Use Authorization (EUA). This EUA will remain in effect (meaning this test can be used) for the duration of the COVID-19 declaration under Section 564(b)(1) of the Act, 21 U.S.C. section 360bbb-3(b)(1), unless the authorization is terminated or revoked.     Resp Syncytial Virus by PCR NEGATIVE NEGATIVE Final    Comment: (NOTE) Fact Sheet for Patients: BloggerCourse.com  Fact Sheet for Healthcare Providers: SeriousBroker.it  This test is not yet approved or cleared by the [place]  FDA and has been authorized for detection and/or diagnosis of SARS-CoV-2 by FDA under an Emergency Use Authorization (EUA). This EUA will remain in effect (meaning this test can be used) for the duration of the COVID-19 declaration under Section 564(b)(1) of the Act, 21 U.S.C. section 360bbb-3(b)(1), unless the authorization is terminated or revoked.  Performed at [facility], [address], [place], [place] [address]   Culture, blood (Routine X 2) w Reflex to ID Panel     Status: None (Preliminary result)   Collection Time: 05/03/24  7:59 AM   Specimen: BLOOD  Result Value Ref Range Status   Specimen Description BLOOD SITE NOT SPECIFIED  Final   Special Requests   Final    BOTTLES DRAWN AEROBIC AND ANAEROBIC Blood Culture results may not be optimal due to an inadequate volume of blood received in culture bottles   Culture   Final    NO GROWTH < 24 HOURS Performed at [facility]  Lab, [address] [place], [place] [address]    Report Status PENDING  Incomplete  Culture, blood (Routine X 2) w Reflex to ID Panel     Status: None (Preliminary result)   Collection Time: 05/03/24  8:00 AM   Specimen: BLOOD  Result Value Ref Range Status   Specimen Description BLOOD SITE NOT SPECIFIED  Final   Special Requests   Final    BOTTLES DRAWN AEROBIC AND ANAEROBIC Blood Culture adequate volume   Culture   Final    NO GROWTH < 24 HOURS Performed at [facility], [address] [place], [place] [address]    Report Status PENDING  Incomplete    RADIOLOGY STUDIES/RESULTS: No results found.    LOS: 1 day   Donalda Applebaum, MD  [facility]    To contact the attending provider between 7A-7P or the covering provider during after hours 7P-7A, please log into the web site [email or web address] and access using universal [place] password for that web site. If you do not have the password, please call the hospital operator.  05/04/2024, 11:06 AM

## 2024-05-05 DIAGNOSIS — E785 Hyperlipidemia, unspecified: Secondary | ICD-10-CM | POA: Diagnosis not present

## 2024-05-05 DIAGNOSIS — E876 Hypokalemia: Secondary | ICD-10-CM | POA: Diagnosis not present

## 2024-05-05 DIAGNOSIS — A419 Sepsis, unspecified organism: Secondary | ICD-10-CM | POA: Diagnosis not present

## 2024-05-05 DIAGNOSIS — N3000 Acute cystitis without hematuria: Secondary | ICD-10-CM | POA: Diagnosis not present

## 2024-05-05 LAB — BASIC METABOLIC PANEL WITH GFR
Anion gap: 9 (ref 5–15)
BUN: 9 mg/dL (ref 8–23)
CO2: 25 mmol/L (ref 22–32)
Calcium: 9.1 mg/dL (ref 8.9–10.3)
Chloride: 104 mmol/L (ref 98–111)
Creatinine, Ser: 0.87 mg/dL (ref 0.61–1.24)
GFR, Estimated: >60 mL/min (ref >60)
Glucose, Bld: 139 mg/dL — ABNORMAL HIGH (ref 70–99)
Potassium: 3.7 mmol/L (ref 3.5–5.1)
Sodium: 138 mmol/L (ref 135–145)

## 2024-05-05 LAB — GLUCOSE, CAPILLARY
Glucose-Capillary: 225 mg/dL — ABNORMAL HIGH (ref 70–99)
Glucose-Capillary: 182 mg/dL — ABNORMAL HIGH (ref 70–99)
Glucose-Capillary: 200 mg/dL — ABNORMAL HIGH (ref 70–99)
Glucose-Capillary: 194 mg/dL — ABNORMAL HIGH (ref 70–99)
Glucose-Capillary: 225 mg/dL — ABNORMAL HIGH (ref 70–99)

## 2024-05-05 LAB — CBC
HCT: 38.1 % — ABNORMAL LOW (ref 39.0–52.0)
Hemoglobin: 13 g/dL (ref 13.0–17.0)
MCH: 29.3 pg (ref 26.0–34.0)
MCHC: 34.1 g/dL (ref 30.0–36.0)
MCV: 86 fL (ref 80.0–100.0)
Platelets: 207 K/uL (ref 150–400)
RBC: 4.43 MIL/uL (ref 4.22–5.81)
RDW: 14.1 % (ref 11.5–15.5)
WBC: 11.4 K/uL — ABNORMAL HIGH (ref 4.0–10.5)
nRBC: 0 % (ref 0.0–0.2)

## 2024-05-05 LAB — MAGNESIUM: Magnesium: 1.8 mg/dL (ref 1.7–2.4)

## 2024-05-05 MED ORDER — HYDROCODONE-ACETAMINOPHEN 5-325 MG PO TABS
1.0000 | ORAL_TABLET | Freq: Four times a day (QID) | ORAL | Status: DC | PRN
  Administered 2024-05-05 – 2024-05-08 (×7): 2 via ORAL
  Filled 2024-05-05 (×7): qty 2

## 2024-05-05 MED ORDER — LIDOCAINE 4 % EX CREA
TOPICAL_CREAM | Freq: Every morning | CUTANEOUS | Status: DC
  Administered 2024-05-05 – 2024-05-08 (×4): 1 via TOPICAL
  Filled 2024-05-05 (×2): qty 5

## 2024-05-05 MED ORDER — OXYCODONE HCL 5 MG PO TABS: 2.5000 mg | ORAL_TABLET | Freq: Four times a day (QID) | ORAL | Status: DC | PRN

## 2024-05-05 MED ORDER — METHOCARBAMOL 500 MG PO TABS
500.0000 mg | ORAL_TABLET | Freq: Three times a day (TID) | ORAL | Status: DC | PRN
  Administered 2024-05-05 – 2024-05-08 (×2): 500 mg via ORAL
  Filled 2024-05-05 (×2): qty 1

## 2024-05-05 MED ORDER — MIDODRINE HCL 5 MG PO TABS
7.5000 mg | ORAL_TABLET | Freq: Three times a day (TID) | ORAL | Status: DC
  Administered 2024-05-05 – 2024-05-08 (×10): 7.5 mg via ORAL
  Filled 2024-05-05 (×10): qty 2

## 2024-05-05 MED ORDER — TORSEMIDE 20 MG PO TABS
20.0000 mg | ORAL_TABLET | Freq: Every day | ORAL | Status: DC
  Administered 2024-05-05 – 2024-05-08 (×4): 20 mg via ORAL
  Filled 2024-05-05 (×4): qty 1

## 2024-05-05 NOTE — Progress Notes (Signed)
 PROGRESS NOTE        PATIENT DETAILS Name: Lawrence [name] Age: 68 y.o. Sex: male Date of Birth: Mar 22, 1956 Admit Date: 05/02/2024 Admitting Physician Marsha Ada, MD ERE:[place], [place], [place]  Brief Summary: Patient is a 68 y.o.  male with  history of CVA with residual left-sided weakness, seizure disorder, chronic HFpEF, A-fib on Eliquis , AAS-s/p TAVR in July 2024, COPD, OSA on CPAP-who presented to the ED-after being found on the floor by SNF staff-he was brought to the ED where he was noted to have left-sided facial droop/right-sided weakness-leukocytosis-UA suspicious for UTI-he was thought to have recrudescence of strokelike symptoms due to possible UTI (similar multiple instances in the past)   Significant events: 9/25>> admit to [facility]  Significant studies: 7/31>> echo: EF 55-60% 9/25>> CT head: No acute intracranial abnormality 9/25>> CXR: Mild interstitial prominence.  Significant microbiology data: 9/25>> COVID/influenza/RSV PCR: Negative 9/25>> urine culture: Klebsiella/group B strep. 9/26>> blood culture: No growth  Procedures: None  Consults: Neurology  Subjective: Remains unchanged-no major issues overnight-some neuropathy in his right leg.  Objective: Vitals: Blood pressure (!) 97/56, pulse 89, temperature 97.9 F (36.6 C), temperature source Oral, resp. rate 18, height 6' 2 (1.88 m), weight (!) 140.8 kg, SpO2 95%.   Exam: Awake/alert Chest: Clear to auscultation Chronic left-sided weakness process-moving RLE more fluently today.  Pertinent Labs/Radiology:    Latest Ref Rng & Units 05/05/2024    4:38 AM 05/04/2024    2:45 AM 05/03/2024    7:59 AM  CBC  WBC 4.0 - 10.5 K/uL 11.4  14.3  15.0   Hemoglobin 13.0 - 17.0 g/dL 86.9  87.2  86.2   Hematocrit 39.0 - 52.0 % 38.1  37.9  40.5   Platelets 150 - 400 K/uL 207  196  209     Lab Results  Component Value Date   NA 138 05/05/2024   K 3.7 05/05/2024   CL 104 05/05/2024    CO2 25 05/05/2024     Assessment/Plan: Sepsis secondary to complicated UTI Sepsis physiology improved Afebrile-leukocytosis downtrending Continue IV Rocephin  x 5 days total.  Right-sided weakness Given prior history of similar instances of weakness with infectious issues-it is currently felt that this is probably recrudescence of CVA rather than acute CVA. Continues to improve. Awaiting MRI brain/C-spine-has a stimulator in place that needs to be fully charged before MRI can be performed. In the interim-mobilize with PT/OT.  PAF Telemetry monitoring Eliquis   Chronic HFpEF Volume status stable BP appears to be stable this morning-already on midodrine -resume low-dose Demadex  today.  Chronic hypotension BP soft but stable Remains on midodrine .  Hypokalemia Replete/recheck.  Seizure disorder Keppra /Vimpat   RLS Requip   Peripheral neuropathy Neurontin .  COPD Stable Not in exacerbation Continue bronchodilators  DM-2 (A1c 7.0 on 5/12) CBG stable SSI  Recent Labs    05/04/24 1657 05/04/24 2056 05/05/24 0814  GLUCAP 241* 160* 225*     HLD Statin/fenofibrate   Mood disorder Restless but otherwise stable this morning Continue Abilify /Effexor /trazodone   Pressure Ulcer: Agree with assessment as outlined below. Wound 05/02/24 1000 Pressure Injury Coccyx Medial Stage 1 -  Intact skin with non-blanchable redness of a localized area usually over a bony prominence. (Active)     Wound 05/02/24 1116 Pressure Injury Buttocks Left Deep Tissue Pressure Injury - Purple or maroon localized area of discolored intact skin or blood-filled blister due to  damage of underlying soft tissue from pressure and/or shear. (Active)    Class 1 Obesity: Estimated body mass index is 39.85 kg/m as calculated from the following:   Height as of this encounter: 6' 2 (1.88 m).   Weight as of this encounter: 140.8 kg.   Code status:   Code Status: Limited: Do not attempt resuscitation  (DNR) -DNR-LIMITED -Do Not Intubate/DNI    DVT Prophylaxis: apixaban  (ELIQUIS ) tablet 5 mg    Family Communication: Friend at bedside   Disposition Plan: Status is: Observation The patient will require care spanning > 2 midnights and should be moved to inpatient because: Severity of illness   Planned Discharge Destination:Skilled nursing facility   Diet: Diet Order             Diet regular Room service appropriate? Yes; Fluid consistency: Thin  Diet effective now                     Antimicrobial agents: Anti-infectives (From admission, onward)    Start     Dose/Rate Route Frequency Ordered Stop   05/03/24 1000  cefTRIAXone  (ROCEPHIN ) 1 g in sodium chloride  0.9 % 100 mL IVPB        1 g 200 mL/hr over 30 Minutes Intravenous Every 24 hours 05/02/24 1414 05/07/24 0959   05/02/24 0630  cefTRIAXone  (ROCEPHIN ) 1 g in sodium chloride  0.9 % 100 mL IVPB        1 g 200 mL/hr over 30 Minutes Intravenous  Once 05/02/24 0617 05/02/24 0854        MEDICATIONS: Scheduled Meds:  apixaban   5 mg Oral BID   ARIPiprazole   5 mg Oral Daily   atorvastatin   80 mg Oral QHS   colchicine   0.6 mg Oral Daily   cyanocobalamin   1,000 mcg Oral Daily   fenofibrate   54 mg Oral Daily   fluticasone   2 spray Each Nare Daily   hydrOXYzine   10 mg Oral Once   insulin  aspart  0-5 Units Subcutaneous QHS   insulin  aspart  0-6 Units Subcutaneous TID WC   lacosamide   150 mg Oral BID   levETIRAcetam   500 mg Oral BID   midodrine   5 mg Oral TID WC   pantoprazole   40 mg Oral Daily   polyethylene glycol  17 g Oral Daily   prazosin   1 mg Oral QHS   pregabalin   150 mg Oral TID   rOPINIRole   0.25 mg Oral BID   traZODone   150 mg Oral QHS   umeclidinium bromide   1 puff Inhalation Daily   venlafaxine  XR  150 mg Oral Q breakfast   Continuous Infusions:  cefTRIAXone  (ROCEPHIN )  IV 1 g (05/05/24 0949)   PRN Meds:.HYDROcodone -acetaminophen    I have personally reviewed following labs and imaging  studies  LABORATORY DATA: CBC: Recent Labs  Lab 05/02/24 0318 05/02/24 0319 05/02/24 0750 05/03/24 0759 05/04/24 0245 05/05/24 0438  WBC 18.7*  --  18.5* 15.0* 14.3* 11.4*  NEUTROABS 15.4*  --   --   --   --   --   HGB 16.8 17.0 15.0 13.7 12.7* 13.0  HCT 50.1 50.0 44.1 40.5 37.9* 38.1*  MCV 84.9  --  85.6 84.9 85.2 86.0  PLT 231  --  209 209 196 207    Basic Metabolic Panel: Recent Labs  Lab 05/02/24 0318 05/02/24 0319 05/02/24 0750 05/03/24 0759 05/04/24 0245 05/05/24 0438  NA 133* 134*  --  134* 135 138  K 2.8* 2.7*  --  3.6 2.9* 3.7  CL 92* 94*  --  99 102 104  CO2 25  --   --  22 22 25   GLUCOSE 231* 236*  --  245* 185* 139*  BUN 16 17  --  13 11 9   CREATININE 1.10 1.10 1.18 0.95 0.91 0.87  CALCIUM  9.3  --   --  8.6* 8.7* 9.1  MG  --   --  1.7  1.7 1.7  --  1.8    GFR: Estimated Creatinine Clearance: 123.1 mL/min (by C-G formula based on SCr of 0.87 mg/dL).  Liver Function Tests: Recent Labs  Lab 05/02/24 0318 05/03/24 0759  AST 20 15  ALT 24 17  ALKPHOS 67 53  BILITOT 1.4* 1.4*  PROT 7.0 5.8*  ALBUMIN 3.5 2.7*   No results for input(s): LIPASE, AMYLASE in the last 168 hours. No results for input(s): AMMONIA in the last 168 hours.  Coagulation Profile: Recent Labs  Lab 05/02/24 0318  INR 1.1    Cardiac Enzymes: No results for input(s): CKTOTAL, CKMB, CKMBINDEX, TROPONINI in the last 168 hours.  BNP (last 3 results) No results for input(s): PROBNP in the last 8760 hours.  Lipid Profile: No results for input(s): CHOL, HDL, LDLCALC, TRIG, CHOLHDL, LDLDIRECT in the last 72 hours.  Thyroid  Function Tests: No results for input(s): TSH, T4TOTAL, FREET4, T3FREE, THYROIDAB in the last 72 hours.  Anemia Panel: No results for input(s): VITAMINB12, FOLATE, FERRITIN, TIBC, IRON, RETICCTPCT in the last 72 hours.  Urine analysis:    Component Value Date/Time   COLORURINE YELLOW 05/02/2024 0505    APPEARANCEUR HAZY (A) 05/02/2024 0505   LABSPEC 1.013 05/02/2024 0505   PHURINE 5.0 05/02/2024 0505   GLUCOSEU 50 (A) 05/02/2024 0505   HGBUR NEGATIVE 05/02/2024 0505   BILIRUBINUR NEGATIVE 05/02/2024 0505   KETONESUR NEGATIVE 05/02/2024 0505   PROTEINUR NEGATIVE 05/02/2024 0505   NITRITE NEGATIVE 05/02/2024 0505   LEUKOCYTESUR SMALL (A) 05/02/2024 0505    Sepsis Labs: Lactic Acid, Venous    Component Value Date/Time   LATICACIDVEN 2.1 (HH) 05/02/2024 0626    MICROBIOLOGY: Recent Results (from the past 240 hours)  Urine Culture     Status: Abnormal   Collection Time: 05/02/24  5:05 AM   Specimen: Urine, Catheterized  Result Value Ref Range Status   Specimen Description URINE, CATHETERIZED  Final   Special Requests NONE  Final   Culture (A)  Final    >=100,000 COLONIES/mL KLEBSIELLA PNEUMONIAE 20,000 COLONIES/mL GROUP B STREP(S.AGALACTIAE)ISOLATED TESTING AGAINST S. AGALACTIAE NOT ROUTINELY PERFORMED DUE TO PREDICTABILITY OF AMP/PEN/VAN SUSCEPTIBILITY. Performed at [name], [address], [place], [place] [address]    Report Status 05/04/2024 FINAL  Final   Organism ID, Bacteria KLEBSIELLA PNEUMONIAE (A)  Final      Susceptibility   Klebsiella pneumoniae - MIC*    AMPICILLIN  >=32 RESISTANT Resistant     CEFAZOLIN  (URINE) Value in next row Sensitive      2 SENSITIVEThis is a modified FDA-approved test that has been validated and its performance characteristics determined by the reporting laboratory.  This laboratory is certified under the Clinical Laboratory Improvement Amendments CLIA as qualified to perform high complexity clinical laboratory testing.    CEFEPIME  Value in next row Sensitive      2 SENSITIVEThis is a modified FDA-approved test that has been validated and its performance characteristics determined by the reporting laboratory.  This laboratory is certified under the Clinical Laboratory Improvement Amendments CLIA as qualified to perform  high  complexity clinical laboratory testing.    ERTAPENEM Value in next row Sensitive      2 SENSITIVEThis is a modified FDA-approved test that has been validated and its performance characteristics determined by the reporting laboratory.  This laboratory is certified under the Clinical Laboratory Improvement Amendments CLIA as qualified to perform high complexity clinical laboratory testing.    CEFTRIAXONE  Value in next row Sensitive      2 SENSITIVEThis is a modified FDA-approved test that has been validated and its performance characteristics determined by the reporting laboratory.  This laboratory is certified under the Clinical Laboratory Improvement Amendments CLIA as qualified to perform high complexity clinical laboratory testing.    CIPROFLOXACIN Value in next row Sensitive      2 SENSITIVEThis is a modified FDA-approved test that has been validated and its performance characteristics determined by the reporting laboratory.  This laboratory is certified under the Clinical Laboratory Improvement Amendments CLIA as qualified to perform high complexity clinical laboratory testing.    GENTAMICIN Value in next row Sensitive      2 SENSITIVEThis is a modified FDA-approved test that has been validated and its performance characteristics determined by the reporting laboratory.  This laboratory is certified under the Clinical Laboratory Improvement Amendments CLIA as qualified to perform high complexity clinical laboratory testing.    NITROFURANTOIN Value in next row Intermediate      2 SENSITIVEThis is a modified FDA-approved test that has been validated and its performance characteristics determined by the reporting laboratory.  This laboratory is certified under the Clinical Laboratory Improvement Amendments CLIA as qualified to perform high complexity clinical laboratory testing.    TRIMETH /SULFA  Value in next row Sensitive      2 SENSITIVEThis is a modified FDA-approved test that has been validated and  its performance characteristics determined by the reporting laboratory.  This laboratory is certified under the Clinical Laboratory Improvement Amendments CLIA as qualified to perform high complexity clinical laboratory testing.    AMPICILLIN /SULBACTAM Value in next row Sensitive      2 SENSITIVEThis is a modified FDA-approved test that has been validated and its performance characteristics determined by the reporting laboratory.  This laboratory is certified under the Clinical Laboratory Improvement Amendments CLIA as qualified to perform high complexity clinical laboratory testing.    PIP/TAZO Value in next row Sensitive      <=4 SENSITIVEThis is a modified FDA-approved test that has been validated and its performance characteristics determined by the reporting laboratory.  This laboratory is certified under the Clinical Laboratory Improvement Amendments CLIA as qualified to perform high complexity clinical laboratory testing.    MEROPENEM Value in next row Sensitive      <=4 SENSITIVEThis is a modified FDA-approved test that has been validated and its performance characteristics determined by the reporting laboratory.  This laboratory is certified under the Clinical Laboratory Improvement Amendments CLIA as qualified to perform high complexity clinical laboratory testing.    * >=100,000 COLONIES/mL KLEBSIELLA PNEUMONIAE  Resp panel by RT-PCR (RSV, Flu A&B, Covid) Anterior Nasal Swab     Status: None   Collection Time: 05/02/24  7:26 AM   Specimen: Anterior Nasal Swab  Result Value Ref Range Status   SARS Coronavirus 2 by RT PCR NEGATIVE NEGATIVE Final   Influenza A by PCR NEGATIVE NEGATIVE Final   Influenza B by PCR NEGATIVE NEGATIVE Final    Comment: (NOTE) The Xpert Xpress SARS-CoV-2/FLU/RSV plus assay is intended as an aid in the diagnosis of influenza from Nasopharyngeal  swab specimens and should not be used as a sole basis for treatment. Nasal washings and aspirates are unacceptable for  Xpert Xpress SARS-CoV-2/FLU/RSV testing.  Fact Sheet for Patients: BloggerCourse.com  Fact Sheet for Healthcare Providers: SeriousBroker.it  This test is not yet approved or cleared by the [place]  FDA and has been authorized for detection and/or diagnosis of SARS-CoV-2 by FDA under an Emergency Use Authorization (EUA). This EUA will remain in effect (meaning this test can be used) for the duration of the COVID-19 declaration under Section 564(b)(1) of the Act, 21 U.S.C. section 360bbb-3(b)(1), unless the authorization is terminated or revoked.     Resp Syncytial Virus by PCR NEGATIVE NEGATIVE Final    Comment: (NOTE) Fact Sheet for Patients: BloggerCourse.com  Fact Sheet for Healthcare Providers: SeriousBroker.it  This test is not yet approved or cleared by the [place]  FDA and has been authorized for detection and/or diagnosis of SARS-CoV-2 by FDA under an Emergency Use Authorization (EUA). This EUA will remain in effect (meaning this test can be used) for the duration of the COVID-19 declaration under Section 564(b)(1) of the Act, 21 U.S.C. section 360bbb-3(b)(1), unless the authorization is terminated or revoked.  Performed at [facility], [address] [place], [place] [address]   Culture, blood (Routine X 2) w Reflex to ID Panel     Status: None (Preliminary result)   Collection Time: 05/03/24  7:59 AM   Specimen: BLOOD  Result Value Ref Range Status   Specimen Description BLOOD SITE NOT SPECIFIED  Final   Special Requests   Final    BOTTLES DRAWN AEROBIC AND ANAEROBIC Blood Culture results may not be optimal due to an inadequate volume of blood received in culture bottles   Culture   Final    NO GROWTH 2 DAYS Performed at [facility], [address], [place], [place] [address]    Report Status PENDING  Incomplete  Culture, blood (Routine X  2) w Reflex to ID Panel     Status: None (Preliminary result)   Collection Time: 05/03/24  8:00 AM   Specimen: BLOOD  Result Value Ref Range Status   Specimen Description BLOOD SITE NOT SPECIFIED  Final   Special Requests   Final    BOTTLES DRAWN AEROBIC AND ANAEROBIC Blood Culture adequate volume   Culture   Final    NO GROWTH 2 DAYS Performed at [facility], [address] [place], [place] [address]    Report Status PENDING  Incomplete    RADIOLOGY STUDIES/RESULTS: No results found.    LOS: 2 days   Donalda Applebaum, MD  [facility]    To contact the attending provider between 7A-7P or the covering provider during after hours 7P-7A, please log into the web site [email or web address] and access using universal [place] password for that web site. If you do not have the password, please call the hospital operator.  05/05/2024, 10:16 AM

## 2024-05-05 NOTE — Plan of Care (Signed)

## 2024-05-05 NOTE — Evaluation (Signed)
 Occupational Therapy Evaluation Patient Details Name: Lawrence [name] MRN: [id number] DOB: August 06, 1956 Today's Date: 05/05/2024   History of Present Illness   Lawrence [name] is a 68 y.o. male who presented to the [facility] on 05/02/24 after being found on the floor with pt noted to have left-sided facial droop/right-sided weakness. Found to have leukocytosis and UA suspicious for UTI and thought to have recrudescence of stroke-like symptoms due to possible UTI iso sepsis due to acute cystitis. PMH: prior CVA w/ L sided weakness, seizure disorder, severe AS s/p TAVR in 02/2023, HFpEF, PAF on Eliquis , COPD, OSA on CPAP chronic venous stasis, gout, BPH, anxiety, morbid obesity, and recent admission in 12/2023 for stroke recrudescence iso Klebsiella UTI.     Clinical Impressions At baseline, pt receives Set up to Max assist for ADLs and performs functional mobility from bed level. Pt now presents with decreased activity tolerance, decreased B UE strength, decreased B UE coordination, and decreased safety and independence with functional tasks. Pt currently demonstrates ability to complete ADLs largely with Set up to Total assist and bed mobility with Contact guard to Min assist. Pt VSS on RA throughout session. Pt participated well in session and is motivated to continue with skilled rehab. Pt will benefit from acute skilled OT services to address deficits outlined below and to increase safety and independence with functional tasks. Post acute discharge, pt will benefit from intensive inpatient skilled rehab services < 3 hours per day to maximize rehab potential.      If plan is discharge home, recommend the following:   Two people to help with walking and/or transfers;A lot of help with bathing/dressing/bathroom;Assistance with cooking/housework;Assist for transportation;Help with stairs or ramp for entrance     Functional Status Assessment   Patient has had a recent decline in their functional status  and demonstrates the ability to make significant improvements in function in a reasonable and predictable amount of time.     Equipment Recommendations   Other (comment) (defer to next level of care)     Recommendations for Other Services         Precautions/Restrictions   Precautions Precautions: Fall Recall of Precautions/Restrictions: Intact Restrictions Weight Bearing Restrictions Per Provider Order: No     Mobility Bed Mobility Overal bed mobility: Needs Assistance Bed Mobility: Rolling, Supine to Sit, Sit to Supine Rolling: Contact guard assist, Min assist, Used rails   Supine to sit: Contact guard Sit to supine: Min assist   General bed mobility comments: Able to scoot up in bed with CGA to Min assist    Transfers Overall transfer level: Needs assistance                 General transfer comment: deferred this session      Balance Overall balance assessment: Needs assistance Sitting-balance support: Single extremity supported, Bilateral upper extremity supported, Feet supported Sitting balance-Leahy Scale: Good                                     ADL either performed or assessed with clinical judgement   ADL Overall ADL's : Needs assistance/impaired Eating/Feeding: Set up;Bed level   Grooming: Set up;Bed level;Wash/dry hands;Wash/dry face;Oral care (with increased time)   Upper Body Bathing: Moderate assistance;Bed level   Lower Body Bathing: Maximal assistance;Total assistance;Bed level   Upper Body Dressing : Moderate assistance;Bed level   Lower Body Dressing: Total assistance;Bed level  Toileting- Clothing Manipulation and Hygiene: Set up;Total assistance;Bed level Toileting - Clothing Manipulation Details (indicate cue type and reason): Set up for use of hand held urinal; Total assist for peri care following a BM       General ADL Comments: Pt with decreased activity toleracnce, fatiguing quickly and  requiring rest breaks during ADLs.     Vision Baseline Vision/History: 1 Wears glasses Ability to See in Adequate Light: 0 Adequate (with glasses) Patient Visual Report: No change from baseline       Perception         Praxis         Pertinent Vitals/Pain Pain Assessment Pain Assessment: No/denies pain     Extremity/Trunk Assessment Upper Extremity Assessment Upper Extremity Assessment: Right hand dominant;RUE deficits/detail;LUE deficits/detail;Generalized weakness RUE Deficits / Details: generalized weakness; limited AROM in shoulder with AAROM WFL; all other AROM WFL; decreased coordination; decreased proprioception RUE Sensation: decreased proprioception RUE Coordination: decreased fine motor;decreased gross motor LUE Deficits / Details: generalized weakness; limited AROM in shoulder with AAROM WFL; all other AROM WFL; decreased coordination; decreased proprioception; impaired sensation with pt reporting frequent pins and needles at baseline LUE Sensation: decreased proprioception (pt reporting frequent pins and needles at baseline) LUE Coordination: decreased fine motor;decreased gross motor   Lower Extremity Assessment Lower Extremity Assessment: Defer to PT evaluation   Cervical / Trunk Assessment Cervical / Trunk Assessment: Kyphotic   Communication Communication Communication: No apparent difficulties   Cognition Arousal: Alert Behavior During Therapy: WFL for tasks assessed/performed Cognition: No apparent impairments             OT - Cognition Comments: Pt AAOx4 and pleasant throughout session. Cognition WFL for tasks assessed; not formally screened or assessed                 Following commands: Intact       Cueing  General Comments   Cueing Techniques: Verbal cues  VSS on RA   Exercises     Shoulder Instructions      Home Living Family/patient expects to be discharged to:: Skilled nursing facility                                  Additional Comments: Pt reports that he has been living at [facility] since February 2025.      Prior Functioning/Environment Prior Level of Function : Needs assist             Mobility Comments: Pt reports he performs funcitonal mobility mostly WC level use of sit to stand machine for transfers. ADLs Comments: Just prior to this admission, pt reports completing self-feeding and grooming with Set-up andincreased time, UB bathing/dressing with Min-Mod assist, LB dressing/bathing with Mod-Max assist, and using BSC with use of mechanical lift and Total assist for peri-care following a BM. Pt able to use hand held urinal with set-up. Pt reports typically completing LB bathing/dressing from bed. Pt enjoys Bible study and started a Bible study group at [employer]. Pt also used to do Marie.    OT Problem List: Decreased strength;Decreased activity tolerance;Impaired balance (sitting and/or standing);Decreased coordination   OT Treatment/Interventions: Self-care/ADL training;Therapeutic exercise;Energy conservation;DME and/or AE instruction;Therapeutic activities;Patient/family education;Balance training      OT Goals(Current goals can be found in the care plan section)   Acute Rehab OT Goals Patient Stated Goal: to have more use of his R UE and  to continue with skilled rehab OT Goal Formulation: With patient Time For Goal Achievement: 05/19/24 Potential to Achieve Goals: Fair ADL Goals Pt Will Perform Upper Body Bathing: with min assist;sitting (with adaptive equipment as needed) Pt Will Perform Lower Body Bathing: with mod assist;bed level (with adaptive equipment as needed) Pt Will Perform Upper Body Dressing: with contact guard assist;with min assist;sitting Pt Will Transfer to Toilet: with mod assist;stand pivot transfer;bedside commode (with least restrictive AD; with bariatric BSC)   OT Frequency:  Min 1X/week     Co-evaluation              AM-PAC OT 6 Clicks Daily Activity     Outcome Measure Help from another person eating meals?: A Little Help from another person taking care of personal grooming?: A Little Help from another person toileting, which includes using toliet, bedpan, or urinal?: A Lot Help from another person bathing (including washing, rinsing, drying)?: A Lot Help from another person to put on and taking off regular upper body clothing?: A Lot Help from another person to put on and taking off regular lower body clothing?: Total 6 Click Score: 13   End of Session Nurse Communication: Mobility status;Other (comment);Need for lift equipment (Pt needs new sacral area pad as old pad was twisted and coming off.)  Activity Tolerance: Patient tolerated treatment well;Patient limited by fatigue Patient left: in bed;with call bell/phone within reach;with bed alarm set  OT Visit Diagnosis: Other abnormalities of gait and mobility (R26.89);Muscle weakness (generalized) (M62.81);Ataxia, unspecified (R27.0)                Time: 1510-1606 OT Time Calculation (min): 56 min Charges:  OT General Charges $OT Visit: 1 Visit OT Evaluation $OT Eval Moderate Complexity: 1 Mod OT Treatments $Self Care/Home Management : 38-52 mins  Margarie Rockey HERO., OTR/L, MA Acute Rehab [phone]   Margarie FORBES Horns 05/05/2024, 6:21 PM

## 2024-05-06 ENCOUNTER — Ambulatory Visit: Admitting: Professional Counselor

## 2024-05-06 DIAGNOSIS — A419 Sepsis, unspecified organism: Secondary | ICD-10-CM | POA: Diagnosis not present

## 2024-05-06 DIAGNOSIS — N3000 Acute cystitis without hematuria: Secondary | ICD-10-CM | POA: Diagnosis not present

## 2024-05-06 DIAGNOSIS — E876 Hypokalemia: Secondary | ICD-10-CM | POA: Diagnosis not present

## 2024-05-06 DIAGNOSIS — E785 Hyperlipidemia, unspecified: Secondary | ICD-10-CM | POA: Diagnosis not present

## 2024-05-06 LAB — GLUCOSE, CAPILLARY
Glucose-Capillary: 185 mg/dL — ABNORMAL HIGH (ref 70–99)
Glucose-Capillary: 202 mg/dL — ABNORMAL HIGH (ref 70–99)
Glucose-Capillary: 157 mg/dL — ABNORMAL HIGH (ref 70–99)
Glucose-Capillary: 172 mg/dL — ABNORMAL HIGH (ref 70–99)

## 2024-05-06 LAB — BASIC METABOLIC PANEL WITH GFR
Anion gap: 9 (ref 5–15)
BUN: 12 mg/dL (ref 8–23)
CO2: 22 mmol/L (ref 22–32)
Calcium: 8.5 mg/dL — ABNORMAL LOW (ref 8.9–10.3)
Chloride: 102 mmol/L (ref 98–111)
Creatinine, Ser: 0.97 mg/dL (ref 0.61–1.24)
GFR, Estimated: >60 mL/min (ref >60)
Glucose, Bld: 171 mg/dL — ABNORMAL HIGH (ref 70–99)
Potassium: 3 mmol/L — ABNORMAL LOW (ref 3.5–5.1)
Sodium: 133 mmol/L — ABNORMAL LOW (ref 135–145)

## 2024-05-06 MED ORDER — POTASSIUM CHLORIDE CRYS ER 20 MEQ PO TBCR
40.0000 meq | EXTENDED_RELEASE_TABLET | Freq: Once | ORAL | Status: AC
  Administered 2024-05-06: 40 meq via ORAL

## 2024-05-06 MED ORDER — POTASSIUM CHLORIDE CRYS ER 20 MEQ PO TBCR
40.0000 meq | EXTENDED_RELEASE_TABLET | Freq: Once | ORAL | Status: DC
  Filled 2024-05-06: qty 2

## 2024-05-06 NOTE — Plan of Care (Signed)
 MRI techs attempted to put his stimulator in MRI mode. It gave an impendence out of range error. MRI technician called the company and were advised that it is not safe to continue to place into MRI mode with any impendence error.   A/R: 68 y.o. male with history of previous stroke and left-sided weakness who comes in with worsening left-sided weakness.  It is possible that this is recrudescence in the setting of his infection. Given the persistence of his deficits, MRI was ordered. However, unable to complete MRI due to impedance issue with his spinal stimulator.   - Would continue to treat his underlying infection and assess for possible improvement with physical therapy.  - He will need outpatient Neurology follow up.  - Neurohospitalist service will follow PRN.   Electronically signed: Dr. Addisen Chappelle

## 2024-05-06 NOTE — Plan of Care (Signed)

## 2024-05-06 NOTE — Progress Notes (Signed)
 PROGRESS NOTE        PATIENT DETAILS Name: Lawrence [name] Age: 68 y.o. Sex: male Date of Birth: [birth year] Admit Date: 05/02/2024 Admitting Physician Marsha Ada, MD ERE:[place], [place], [place]  Brief Summary: Patient is a 68 y.o.  male with  history of CVA with residual left-sided weakness, seizure disorder, chronic HFpEF, A-fib on Eliquis , AAS-s/p TAVR in July 2024, COPD, OSA on CPAP-who presented to the ED-after being found on the floor by SNF staff-he was brought to the ED where he was noted to have left-sided facial droop/right-sided weakness-leukocytosis-UA suspicious for UTI-he was thought to have recrudescence of strokelike symptoms due to possible UTI (similar multiple instances in the past)   Significant events: 9/25>> admit to [facility]  Significant studies: 7/31>> echo: EF 55-60% 9/25>> CT head: No acute intracranial abnormality 9/25>> CXR: Mild interstitial prominence.  Significant microbiology data: 9/25>> COVID/influenza/RSV PCR: Negative 9/25>> urine culture: Klebsiella/group B strep. 9/26>> blood culture: No growth  Procedures: None  Consults: Neurology  Subjective: No major use overnight-neuropathic pain in right lower extremity better.  Objective: Vitals: Blood pressure 121/78, pulse 80, temperature 97.9 F (36.6 C), temperature source Oral, resp. rate 18, height 6' 2 (1.88 m), weight (!) 140.8 kg, SpO2 94%.   Exam: Awake/alert Chest: Clear to auscultation Abdomen: Soft nontender nondistended Chronic left-sided weakness-unchanged-moving RLE fluently  Pertinent Labs/Radiology:    Latest Ref Rng & Units 05/05/2024    4:38 AM 05/04/2024    2:45 AM 05/03/2024    7:59 AM  CBC  WBC 4.0 - 10.5 K/uL 11.4  14.3  15.0   Hemoglobin 13.0 - 17.0 g/dL 86.9  87.2  86.2   Hematocrit 39.0 - 52.0 % 38.1  37.9  40.5   Platelets 150 - 400 K/uL 207  196  209     Lab Results  Component Value Date   NA 133 (L) 05/06/2024   K 3.0 (L)  05/06/2024   CL 102 05/06/2024   CO2 22 05/06/2024     Assessment/Plan: Sepsis secondary to complicated UTI Sepsis physiology improved Afebrile-leukocytosis downtrending Continue IV Rocephin  x 5 days total.  Right-sided weakness Given prior history of similar instances of weakness with infectious issues-it is currently felt that this is probably recrudescence of CVA rather than acute CVA. Continues to improve. Awaiting MRI brain/C-spine-has a stimulator in place that needs to be fully charged before MRI can be performed. In the interim-mobilize with PT/OT.  PAF Telemetry monitoring Eliquis   Chronic HFpEF Volume status stable Continue Demadex .  Chronic hypotension BP soft but stable Remains on midodrine .  Hypokalemia Replete/recheck.  Seizure disorder Keppra /Vimpat   RLS Requip   Peripheral neuropathy Neurontin .  COPD Stable Not in exacerbation Continue bronchodilators  DM-2 (A1c 7.0 on 5/12) CBG stable SSI  Recent Labs    05/05/24 1643 05/05/24 2208 05/06/24 0740  GLUCAP 194* 225* 185*     HLD Statin/fenofibrate   Mood disorder Restless but otherwise stable this morning Continue Abilify /Effexor /trazodone   Pressure Ulcer: Agree with assessment as outlined below. Wound 05/02/24 1000 Pressure Injury Coccyx Medial Stage 1 -  Intact skin with non-blanchable redness of a localized area usually over a bony prominence. (Active)     Wound 05/02/24 1116 Pressure Injury Buttocks Left Deep Tissue Pressure Injury - Purple or maroon localized area of discolored intact skin or blood-filled blister due to damage of underlying soft tissue from pressure and/or  shear. (Active)    Class 1 Obesity: Estimated body mass index is 39.85 kg/m as calculated from the following:   Height as of this encounter: 6' 2 (1.88 m).   Weight as of this encounter: 140.8 kg.   Code status:   Code Status: Limited: Do not attempt resuscitation (DNR) -DNR-LIMITED -Do Not  Intubate/DNI    DVT Prophylaxis: apixaban  (ELIQUIS ) tablet 5 mg    Family Communication: None at bedside.   Disposition Plan: Status is: Observation The patient will require care spanning > 2 midnights and should be moved to inpatient because: Severity of illness   Planned Discharge Destination:Skilled nursing facility   Diet: Diet Order             Diet regular Room service appropriate? Yes; Fluid consistency: Thin  Diet effective now                     Antimicrobial agents: Anti-infectives (From admission, onward)    Start     Dose/Rate Route Frequency Ordered Stop   05/03/24 1000  cefTRIAXone  (ROCEPHIN ) 1 g in sodium chloride  0.9 % 100 mL IVPB        1 g 200 mL/hr over 30 Minutes Intravenous Every 24 hours 05/02/24 1414 05/07/24 0959   05/02/24 0630  cefTRIAXone  (ROCEPHIN ) 1 g in sodium chloride  0.9 % 100 mL IVPB        1 g 200 mL/hr over 30 Minutes Intravenous  Once 05/02/24 0617 05/02/24 0854        MEDICATIONS: Scheduled Meds:  apixaban   5 mg Oral BID   ARIPiprazole   5 mg Oral Daily   atorvastatin   80 mg Oral QHS   colchicine   0.6 mg Oral Daily   cyanocobalamin   1,000 mcg Oral Daily   fenofibrate   54 mg Oral Daily   fluticasone   2 spray Each Nare Daily   insulin  aspart  0-5 Units Subcutaneous QHS   insulin  aspart  0-6 Units Subcutaneous TID WC   lacosamide   150 mg Oral BID   levETIRAcetam   500 mg Oral BID   lidocaine    Topical q AM   midodrine   7.5 mg Oral TID WC   pantoprazole   40 mg Oral Daily   polyethylene glycol  17 g Oral Daily   potassium chloride   40 mEq Oral Once   prazosin   1 mg Oral QHS   pregabalin   150 mg Oral TID   rOPINIRole   0.25 mg Oral BID   torsemide   20 mg Oral Daily   traZODone   150 mg Oral QHS   umeclidinium bromide   1 puff Inhalation Daily   venlafaxine  XR  150 mg Oral Q breakfast   Continuous Infusions:  cefTRIAXone  (ROCEPHIN )  IV 1 g (05/05/24 0949)   PRN Meds:.HYDROcodone -acetaminophen , methocarbamol    I have  personally reviewed following labs and imaging studies  LABORATORY DATA: CBC: Recent Labs  Lab 05/02/24 0318 05/02/24 0319 05/02/24 0750 05/03/24 0759 05/04/24 0245 05/05/24 0438  WBC 18.7*  --  18.5* 15.0* 14.3* 11.4*  NEUTROABS 15.4*  --   --   --   --   --   HGB 16.8 17.0 15.0 13.7 12.7* 13.0  HCT 50.1 50.0 44.1 40.5 37.9* 38.1*  MCV 84.9  --  85.6 84.9 85.2 86.0  PLT 231  --  209 209 196 207    Basic Metabolic Panel: Recent Labs  Lab 05/02/24 0318 05/02/24 0319 05/02/24 0750 05/03/24 0759 05/04/24 0245 05/05/24 0438 05/06/24 0325  NA 133*  134*  --  134* 135 138 133*  K 2.8* 2.7*  --  3.6 2.9* 3.7 3.0*  CL 92* 94*  --  99 102 104 102  CO2 25  --   --  22 22 25 22   GLUCOSE 231* 236*  --  245* 185* 139* 171*  BUN 16 17  --  13 11 9 12   CREATININE 1.10 1.10 1.18 0.95 0.91 0.87 0.97  CALCIUM  9.3  --   --  8.6* 8.7* 9.1 8.5*  MG  --   --  1.7  1.7 1.7  --  1.8  --     GFR: Estimated Creatinine Clearance: 110.4 mL/min (by C-G formula based on SCr of 0.97 mg/dL).  Liver Function Tests: Recent Labs  Lab 05/02/24 0318 05/03/24 0759  AST 20 15  ALT 24 17  ALKPHOS 67 53  BILITOT 1.4* 1.4*  PROT 7.0 5.8*  ALBUMIN 3.5 2.7*   No results for input(s): LIPASE, AMYLASE in the last 168 hours. No results for input(s): AMMONIA in the last 168 hours.  Coagulation Profile: Recent Labs  Lab 05/02/24 0318  INR 1.1    Cardiac Enzymes: No results for input(s): CKTOTAL, CKMB, CKMBINDEX, TROPONINI in the last 168 hours.  BNP (last 3 results) No results for input(s): PROBNP in the last 8760 hours.  Lipid Profile: No results for input(s): CHOL, HDL, LDLCALC, TRIG, CHOLHDL, LDLDIRECT in the last 72 hours.  Thyroid  Function Tests: No results for input(s): TSH, T4TOTAL, FREET4, T3FREE, THYROIDAB in the last 72 hours.  Anemia Panel: No results for input(s): VITAMINB12, FOLATE, FERRITIN, TIBC, IRON, RETICCTPCT in the  last 72 hours.  Urine analysis:    Component Value Date/Time   COLORURINE YELLOW 05/02/2024 0505   APPEARANCEUR HAZY (A) 05/02/2024 0505   LABSPEC 1.013 05/02/2024 0505   PHURINE 5.0 05/02/2024 0505   GLUCOSEU 50 (A) 05/02/2024 0505   HGBUR NEGATIVE 05/02/2024 0505   BILIRUBINUR NEGATIVE 05/02/2024 0505   KETONESUR NEGATIVE 05/02/2024 0505   PROTEINUR NEGATIVE 05/02/2024 0505   NITRITE NEGATIVE 05/02/2024 0505   LEUKOCYTESUR SMALL (A) 05/02/2024 0505    Sepsis Labs: Lactic Acid, Venous    Component Value Date/Time   LATICACIDVEN 2.1 (HH) 05/02/2024 0626    MICROBIOLOGY: Recent Results (from the past 240 hours)  Urine Culture     Status: Abnormal   Collection Time: 05/02/24  5:05 AM   Specimen: Urine, Catheterized  Result Value Ref Range Status   Specimen Description URINE, CATHETERIZED  Final   Special Requests NONE  Final   Culture (A)  Final    >=100,000 COLONIES/mL KLEBSIELLA PNEUMONIAE 20,000 COLONIES/mL GROUP B STREP(S.AGALACTIAE)ISOLATED TESTING AGAINST S. AGALACTIAE NOT ROUTINELY PERFORMED DUE TO PREDICTABILITY OF AMP/PEN/VAN SUSCEPTIBILITY. Performed at [facility], [address], [place], [place] [address]    Report Status 05/04/2024 FINAL  Final   Organism ID, Bacteria KLEBSIELLA PNEUMONIAE (A)  Final      Susceptibility   Klebsiella pneumoniae - MIC*    AMPICILLIN  >=32 RESISTANT Resistant     CEFAZOLIN  (URINE) Value in next row Sensitive      2 SENSITIVEThis is a modified FDA-approved test that has been validated and its performance characteristics determined by the reporting laboratory.  This laboratory is certified under the Clinical Laboratory Improvement Amendments CLIA as qualified to perform high complexity clinical laboratory testing.    CEFEPIME  Value in next row Sensitive      2 SENSITIVEThis is a modified FDA-approved test that has been validated  and its performance characteristics determined by the reporting laboratory.  This laboratory is  certified under the Clinical Laboratory Improvement Amendments CLIA as qualified to perform high complexity clinical laboratory testing.    ERTAPENEM Value in next row Sensitive      2 SENSITIVEThis is a modified FDA-approved test that has been validated and its performance characteristics determined by the reporting laboratory.  This laboratory is certified under the Clinical Laboratory Improvement Amendments CLIA as qualified to perform high complexity clinical laboratory testing.    CEFTRIAXONE  Value in next row Sensitive      2 SENSITIVEThis is a modified FDA-approved test that has been validated and its performance characteristics determined by the reporting laboratory.  This laboratory is certified under the Clinical Laboratory Improvement Amendments CLIA as qualified to perform high complexity clinical laboratory testing.    CIPROFLOXACIN Value in next row Sensitive      2 SENSITIVEThis is a modified FDA-approved test that has been validated and its performance characteristics determined by the reporting laboratory.  This laboratory is certified under the Clinical Laboratory Improvement Amendments CLIA as qualified to perform high complexity clinical laboratory testing.    GENTAMICIN Value in next row Sensitive      2 SENSITIVEThis is a modified FDA-approved test that has been validated and its performance characteristics determined by the reporting laboratory.  This laboratory is certified under the Clinical Laboratory Improvement Amendments CLIA as qualified to perform high complexity clinical laboratory testing.    NITROFURANTOIN Value in next row Intermediate      2 SENSITIVEThis is a modified FDA-approved test that has been validated and its performance characteristics determined by the reporting laboratory.  This laboratory is certified under the Clinical Laboratory Improvement Amendments CLIA as qualified to perform high complexity clinical laboratory testing.    TRIMETH /SULFA  Value in next  row Sensitive      2 SENSITIVEThis is a modified FDA-approved test that has been validated and its performance characteristics determined by the reporting laboratory.  This laboratory is certified under the Clinical Laboratory Improvement Amendments CLIA as qualified to perform high complexity clinical laboratory testing.    AMPICILLIN /SULBACTAM Value in next row Sensitive      2 SENSITIVEThis is a modified FDA-approved test that has been validated and its performance characteristics determined by the reporting laboratory.  This laboratory is certified under the Clinical Laboratory Improvement Amendments CLIA as qualified to perform high complexity clinical laboratory testing.    PIP/TAZO Value in next row Sensitive      <=4 SENSITIVEThis is a modified FDA-approved test that has been validated and its performance characteristics determined by the reporting laboratory.  This laboratory is certified under the Clinical Laboratory Improvement Amendments CLIA as qualified to perform high complexity clinical laboratory testing.    MEROPENEM Value in next row Sensitive      <=4 SENSITIVEThis is a modified FDA-approved test that has been validated and its performance characteristics determined by the reporting laboratory.  This laboratory is certified under the Clinical Laboratory Improvement Amendments CLIA as qualified to perform high complexity clinical laboratory testing.    * >=100,000 COLONIES/mL KLEBSIELLA PNEUMONIAE  Resp panel by RT-PCR (RSV, Flu A&B, Covid) Anterior Nasal Swab     Status: None   Collection Time: 05/02/24  7:26 AM   Specimen: Anterior Nasal Swab  Result Value Ref Range Status   SARS Coronavirus 2 by RT PCR NEGATIVE NEGATIVE Final   Influenza A by PCR NEGATIVE NEGATIVE Final   Influenza B by PCR NEGATIVE  NEGATIVE Final    Comment: (NOTE) The Xpert Xpress SARS-CoV-2/FLU/RSV plus assay is intended as an aid in the diagnosis of influenza from Nasopharyngeal swab specimens and should  not be used as a sole basis for treatment. Nasal washings and aspirates are unacceptable for Xpert Xpress SARS-CoV-2/FLU/RSV testing.  Fact Sheet for Patients: BloggerCourse.com  Fact Sheet for Healthcare Providers: SeriousBroker.it  This test is not yet approved or cleared by the [place]  FDA and has been authorized for detection and/or diagnosis of SARS-CoV-2 by FDA under an Emergency Use Authorization (EUA). This EUA will remain in effect (meaning this test can be used) for the duration of the COVID-19 declaration under Section 564(b)(1) of the Act, 21 U.S.C. section 360bbb-3(b)(1), unless the authorization is terminated or revoked.     Resp Syncytial Virus by PCR NEGATIVE NEGATIVE Final    Comment: (NOTE) Fact Sheet for Patients: BloggerCourse.com  Fact Sheet for Healthcare Providers: SeriousBroker.it  This test is not yet approved or cleared by the [place]  FDA and has been authorized for detection and/or diagnosis of SARS-CoV-2 by FDA under an Emergency Use Authorization (EUA). This EUA will remain in effect (meaning this test can be used) for the duration of the COVID-19 declaration under Section 564(b)(1) of the Act, 21 U.S.C. section 360bbb-3(b)(1), unless the authorization is terminated or revoked.  Performed at [facility], [address] [place], [place] [address]   Culture, blood (Routine X 2) w Reflex to ID Panel     Status: None (Preliminary result)   Collection Time: 05/03/24  7:59 AM   Specimen: BLOOD  Result Value Ref Range Status   Specimen Description BLOOD SITE NOT SPECIFIED  Final   Special Requests   Final    BOTTLES DRAWN AEROBIC AND ANAEROBIC Blood Culture results may not be optimal due to an inadequate volume of blood received in culture bottles   Culture   Final    NO GROWTH 3 DAYS Performed at [facility], [address], [place], [place] [address]    Report Status PENDING  Incomplete  Culture, blood (Routine X 2) w Reflex to ID Panel     Status: None (Preliminary result)   Collection Time: 05/03/24  8:00 AM   Specimen: BLOOD  Result Value Ref Range Status   Specimen Description BLOOD SITE NOT SPECIFIED  Final   Special Requests   Final    BOTTLES DRAWN AEROBIC AND ANAEROBIC Blood Culture adequate volume   Culture   Final    NO GROWTH 3 DAYS Performed at [facility], [address], [place], [place] [address]    Report Status PENDING  Incomplete    RADIOLOGY STUDIES/RESULTS: No results found.    LOS: 3 days   Donalda Applebaum, MD  [facility]    To contact the attending provider between 7A-7P or the covering provider during after hours 7P-7A, please log into the web site [email or web address] and access using universal [place] password for that web site. If you do not have the password, please call the hospital operator.  05/06/2024, 10:25 AM

## 2024-05-07 ENCOUNTER — Inpatient Hospital Stay (HOSPITAL_COMMUNITY)

## 2024-05-07 DIAGNOSIS — A419 Sepsis, unspecified organism: Secondary | ICD-10-CM | POA: Diagnosis not present

## 2024-05-07 DIAGNOSIS — N3000 Acute cystitis without hematuria: Secondary | ICD-10-CM | POA: Diagnosis not present

## 2024-05-07 DIAGNOSIS — E785 Hyperlipidemia, unspecified: Secondary | ICD-10-CM | POA: Diagnosis not present

## 2024-05-07 DIAGNOSIS — E876 Hypokalemia: Secondary | ICD-10-CM | POA: Diagnosis not present

## 2024-05-07 LAB — BASIC METABOLIC PANEL WITH GFR
Anion gap: 12 (ref 5–15)
BUN: 10 mg/dL (ref 8–23)
CO2: 22 mmol/L (ref 22–32)
Calcium: 8.9 mg/dL (ref 8.9–10.3)
Chloride: 101 mmol/L (ref 98–111)
Creatinine, Ser: 0.69 mg/dL (ref 0.61–1.24)
GFR, Estimated: >60 mL/min (ref >60)
Glucose, Bld: 165 mg/dL — ABNORMAL HIGH (ref 70–99)
Potassium: 3.2 mmol/L — ABNORMAL LOW (ref 3.5–5.1)
Sodium: 135 mmol/L (ref 135–145)

## 2024-05-07 LAB — GLUCOSE, CAPILLARY
Glucose-Capillary: 293 mg/dL — ABNORMAL HIGH (ref 70–99)
Glucose-Capillary: 217 mg/dL — ABNORMAL HIGH (ref 70–99)
Glucose-Capillary: 212 mg/dL — ABNORMAL HIGH (ref 70–99)
Glucose-Capillary: 195 mg/dL — ABNORMAL HIGH (ref 70–99)

## 2024-05-07 MED ORDER — POTASSIUM CHLORIDE CRYS ER 20 MEQ PO TBCR
40.0000 meq | EXTENDED_RELEASE_TABLET | Freq: Once | ORAL | Status: AC
  Administered 2024-05-07: 40 meq via ORAL
  Filled 2024-05-07: qty 2

## 2024-05-07 NOTE — Progress Notes (Signed)
 PROGRESS NOTE        PATIENT DETAILS Name: Lawrence [name] Age: 68 y.o. Sex: male Date of Birth: 1956-02-03 Admit Date: 05/02/2024 Admitting Physician Marsha Ada, MD ERE:[place], [place], [place]  Brief Summary: Patient is a 68 y.o.  male with  history of CVA with residual left-sided weakness, seizure disorder, chronic HFpEF, A-fib on Eliquis , AAS-s/p TAVR in July 2024, COPD, OSA on CPAP-who presented to the ED-after being found on the floor by SNF staff-he was brought to the ED where he was noted to have left-sided facial droop/right-sided weakness-leukocytosis-UA suspicious for UTI-he was thought to have recrudescence of strokelike symptoms due to possible UTI (similar multiple instances in the past)   Significant events: 9/25>> admit to [facility]  Significant studies: 7/31>> echo: EF 55-60% 9/25>> CT head: No acute intracranial abnormality 9/25>> CXR: Mild interstitial prominence.  Significant microbiology data: 9/25>> COVID/influenza/RSV PCR: Negative 9/25>> urine culture: Klebsiella/group B strep. 9/26>> blood culture: No growth  Procedures: None  Consults: Neurology  Subjective: Continues to have some amount of right sided weakness-although better than how he first presented.  Objective: Vitals: Blood pressure 104/68, pulse 86, temperature 97.9 F (36.6 C), temperature source Oral, resp. rate 20, height 6' 2 (1.88 m), weight (!) 140.8 kg, SpO2 96%.   Exam: Awake/alert Chest: Clear to auscultation CVS: S1-S2 regular Has chronic left-sided weakness at baseline-although improved right sided weakness-still not yet back to baseline.  Pertinent Labs/Radiology:    Latest Ref Rng & Units 05/05/2024    4:38 AM 05/04/2024    2:45 AM 05/03/2024    7:59 AM  CBC  WBC 4.0 - 10.5 K/uL 11.4  14.3  15.0   Hemoglobin 13.0 - 17.0 g/dL 86.9  87.2  86.2   Hematocrit 39.0 - 52.0 % 38.1  37.9  40.5   Platelets 150 - 400 K/uL 207  196  209     Lab Results   Component Value Date   NA 135 05/07/2024   K 3.2 (L) 05/07/2024   CL 101 05/07/2024   CO2 22 05/07/2024     Assessment/Plan: Sepsis secondary to complicated UTI Sepsis physiology improved Afebrile-leukocytosis downtrending  IV Rocephin  x 5 days total-completed 9/29.  Right-sided weakness Given prior history of similar instances of weakness with infectious issues-it is currently felt that this is probably recrudescence of CVA rather than acute CVA. Although has improved compared to initial presentation-continues to have significant right-sided weakness Unable to proceed with MRI brain/C-spine due to impedance issues with spinal cord stimulator--discussed with neurology-Dr. Lindzen 9/30-get CT head-to see if we can now see the stroke.  Neurology will then provide further recommendations. In the interim-mobilize with PT/OT.  PAF Telemetry monitoring Eliquis   Chronic HFpEF Volume status stable Continue Demadex .  Chronic hypotension BP soft but stable Remains on midodrine .  Hypokalemia Replete/recheck.  Seizure disorder Keppra /Vimpat   RLS Requip   Peripheral neuropathy Neurontin .  COPD Stable Not in exacerbation Continue bronchodilators  DM-2 (A1c 7.0 on 5/12) CBG stable SSI  Recent Labs    05/06/24 1544 05/06/24 2104 05/07/24 0808  GLUCAP 157* 172* 293*     HLD Statin/fenofibrate   Mood disorder Restless but otherwise stable this morning Continue Abilify /Effexor /trazodone   Pressure Ulcer: Agree with assessment as outlined below. Wound 05/02/24 1000 Pressure Injury Coccyx Medial Stage 1 -  Intact skin with non-blanchable redness of a localized area usually over a bony  prominence. (Active)     Wound 05/02/24 1116 Pressure Injury Buttocks Left Deep Tissue Pressure Injury - Purple or maroon localized area of discolored intact skin or blood-filled blister due to damage of underlying soft tissue from pressure and/or shear. (Active)    Class 1  Obesity: Estimated body mass index is 39.85 kg/m as calculated from the following:   Height as of this encounter: 6' 2 (1.88 m).   Weight as of this encounter: 140.8 kg.   Code status:   Code Status: Limited: Do not attempt resuscitation (DNR) -DNR-LIMITED -Do Not Intubate/DNI    DVT Prophylaxis: apixaban  (ELIQUIS ) tablet 5 mg    Family Communication: None at bedside.   Disposition Plan: Status is: Observation The patient will require care spanning > 2 midnights and should be moved to inpatient because: Severity of illness   Planned Discharge Destination:Skilled nursing facility   Diet: Diet Order             Diet regular Room service appropriate? Yes; Fluid consistency: Thin  Diet effective now                     Antimicrobial agents: Anti-infectives (From admission, onward)    Start     Dose/Rate Route Frequency Ordered Stop   05/03/24 1000  cefTRIAXone  (ROCEPHIN ) 1 g in sodium chloride  0.9 % 100 mL IVPB        1 g 200 mL/hr over 30 Minutes Intravenous Every 24 hours 05/02/24 1414 05/06/24 1123   05/02/24 0630  cefTRIAXone  (ROCEPHIN ) 1 g in sodium chloride  0.9 % 100 mL IVPB        1 g 200 mL/hr over 30 Minutes Intravenous  Once 05/02/24 0617 05/02/24 0854        MEDICATIONS: Scheduled Meds:  apixaban   5 mg Oral BID   ARIPiprazole   5 mg Oral Daily   atorvastatin   80 mg Oral QHS   colchicine   0.6 mg Oral Daily   cyanocobalamin   1,000 mcg Oral Daily   fenofibrate   54 mg Oral Daily   fluticasone   2 spray Each Nare Daily   insulin  aspart  0-5 Units Subcutaneous QHS   insulin  aspart  0-6 Units Subcutaneous TID WC   lacosamide   150 mg Oral BID   levETIRAcetam   500 mg Oral BID   lidocaine    Topical q AM   midodrine   7.5 mg Oral TID WC   pantoprazole   40 mg Oral Daily   polyethylene glycol  17 g Oral Daily   prazosin   1 mg Oral QHS   pregabalin   150 mg Oral TID   rOPINIRole   0.25 mg Oral BID   torsemide   20 mg Oral Daily   traZODone   150 mg Oral QHS    umeclidinium bromide   1 puff Inhalation Daily   venlafaxine  XR  150 mg Oral Q breakfast   Continuous Infusions:   PRN Meds:.HYDROcodone -acetaminophen , methocarbamol    I have personally reviewed following labs and imaging studies  LABORATORY DATA: CBC: Recent Labs  Lab 05/02/24 0318 05/02/24 0319 05/02/24 0750 05/03/24 0759 05/04/24 0245 05/05/24 0438  WBC 18.7*  --  18.5* 15.0* 14.3* 11.4*  NEUTROABS 15.4*  --   --   --   --   --   HGB 16.8 17.0 15.0 13.7 12.7* 13.0  HCT 50.1 50.0 44.1 40.5 37.9* 38.1*  MCV 84.9  --  85.6 84.9 85.2 86.0  PLT 231  --  209 209 196 207    Basic Metabolic  Panel: Recent Labs  Lab 05/02/24 0750 05/03/24 0759 05/04/24 0245 05/05/24 0438 05/06/24 0325 05/07/24 0317  NA  --  134* 135 138 133* 135  K  --  3.6 2.9* 3.7 3.0* 3.2*  CL  --  99 102 104 102 101  CO2  --  22 22 25 22 22   GLUCOSE  --  245* 185* 139* 171* 165*  BUN  --  13 11 9 12 10   CREATININE 1.18 0.95 0.91 0.87 0.97 0.69  CALCIUM   --  8.6* 8.7* 9.1 8.5* 8.9  MG 1.7  1.7 1.7  --  1.8  --   --     GFR: Estimated Creatinine Clearance: 133.8 mL/min (by C-G formula based on SCr of 0.69 mg/dL).  Liver Function Tests: Recent Labs  Lab 05/02/24 0318 05/03/24 0759  AST 20 15  ALT 24 17  ALKPHOS 67 53  BILITOT 1.4* 1.4*  PROT 7.0 5.8*  ALBUMIN 3.5 2.7*   No results for input(s): LIPASE, AMYLASE in the last 168 hours. No results for input(s): AMMONIA in the last 168 hours.  Coagulation Profile: Recent Labs  Lab 05/02/24 0318  INR 1.1    Cardiac Enzymes: No results for input(s): CKTOTAL, CKMB, CKMBINDEX, TROPONINI in the last 168 hours.  BNP (last 3 results) No results for input(s): PROBNP in the last 8760 hours.  Lipid Profile: No results for input(s): CHOL, HDL, LDLCALC, TRIG, CHOLHDL, LDLDIRECT in the last 72 hours.  Thyroid  Function Tests: No results for input(s): TSH, T4TOTAL, FREET4, T3FREE, THYROIDAB in the last  72 hours.  Anemia Panel: No results for input(s): VITAMINB12, FOLATE, FERRITIN, TIBC, IRON, RETICCTPCT in the last 72 hours.  Urine analysis:    Component Value Date/Time   COLORURINE YELLOW 05/02/2024 0505   APPEARANCEUR HAZY (A) 05/02/2024 0505   LABSPEC 1.013 05/02/2024 0505   PHURINE 5.0 05/02/2024 0505   GLUCOSEU 50 (A) 05/02/2024 0505   HGBUR NEGATIVE 05/02/2024 0505   BILIRUBINUR NEGATIVE 05/02/2024 0505   KETONESUR NEGATIVE 05/02/2024 0505   PROTEINUR NEGATIVE 05/02/2024 0505   NITRITE NEGATIVE 05/02/2024 0505   LEUKOCYTESUR SMALL (A) 05/02/2024 0505    Sepsis Labs: Lactic Acid, Venous    Component Value Date/Time   LATICACIDVEN 2.1 (HH) 05/02/2024 0626    MICROBIOLOGY: Recent Results (from the past 240 hours)  Urine Culture     Status: Abnormal   Collection Time: 05/02/24  5:05 AM   Specimen: Urine, Catheterized  Result Value Ref Range Status   Specimen Description URINE, CATHETERIZED  Final   Special Requests NONE  Final   Culture (A)  Final    >=100,000 COLONIES/mL KLEBSIELLA PNEUMONIAE 20,000 COLONIES/mL GROUP B STREP(S.AGALACTIAE)ISOLATED TESTING AGAINST S. AGALACTIAE NOT ROUTINELY PERFORMED DUE TO PREDICTABILITY OF AMP/PEN/VAN SUSCEPTIBILITY. Performed at [facility], [address] [place], [place] [address]    Report Status 05/04/2024 FINAL  Final   Organism ID, Bacteria KLEBSIELLA PNEUMONIAE (A)  Final      Susceptibility   Klebsiella pneumoniae - MIC*    AMPICILLIN  >=32 RESISTANT Resistant     CEFAZOLIN  (URINE) Value in next row Sensitive      2 SENSITIVEThis is a modified FDA-approved test that has been validated and its performance characteristics determined by the reporting laboratory.  This laboratory is certified under the Clinical Laboratory Improvement Amendments CLIA as qualified to perform high complexity clinical laboratory testing.    CEFEPIME  Value in next row Sensitive      2 SENSITIVEThis is a modified FDA-approved  test that has been validated and its performance characteristics determined by the reporting laboratory.  This laboratory is certified under the Clinical Laboratory Improvement Amendments CLIA as qualified to perform high complexity clinical laboratory testing.    ERTAPENEM Value in next row Sensitive      2 SENSITIVEThis is a modified FDA-approved test that has been validated and its performance characteristics determined by the reporting laboratory.  This laboratory is certified under the Clinical Laboratory Improvement Amendments CLIA as qualified to perform high complexity clinical laboratory testing.    CEFTRIAXONE  Value in next row Sensitive      2 SENSITIVEThis is a modified FDA-approved test that has been validated and its performance characteristics determined by the reporting laboratory.  This laboratory is certified under the Clinical Laboratory Improvement Amendments CLIA as qualified to perform high complexity clinical laboratory testing.    CIPROFLOXACIN Value in next row Sensitive      2 SENSITIVEThis is a modified FDA-approved test that has been validated and its performance characteristics determined by the reporting laboratory.  This laboratory is certified under the Clinical Laboratory Improvement Amendments CLIA as qualified to perform high complexity clinical laboratory testing.    GENTAMICIN Value in next row Sensitive      2 SENSITIVEThis is a modified FDA-approved test that has been validated and its performance characteristics determined by the reporting laboratory.  This laboratory is certified under the Clinical Laboratory Improvement Amendments CLIA as qualified to perform high complexity clinical laboratory testing.    NITROFURANTOIN Value in next row Intermediate      2 SENSITIVEThis is a modified FDA-approved test that has been validated and its performance characteristics determined by the reporting laboratory.  This laboratory is certified under the Clinical Laboratory  Improvement Amendments CLIA as qualified to perform high complexity clinical laboratory testing.    TRIMETH /SULFA  Value in next row Sensitive      2 SENSITIVEThis is a modified FDA-approved test that has been validated and its performance characteristics determined by the reporting laboratory.  This laboratory is certified under the Clinical Laboratory Improvement Amendments CLIA as qualified to perform high complexity clinical laboratory testing.    AMPICILLIN /SULBACTAM Value in next row Sensitive      2 SENSITIVEThis is a modified FDA-approved test that has been validated and its performance characteristics determined by the reporting laboratory.  This laboratory is certified under the Clinical Laboratory Improvement Amendments CLIA as qualified to perform high complexity clinical laboratory testing.    PIP/TAZO Value in next row Sensitive      <=4 SENSITIVEThis is a modified FDA-approved test that has been validated and its performance characteristics determined by the reporting laboratory.  This laboratory is certified under the Clinical Laboratory Improvement Amendments CLIA as qualified to perform high complexity clinical laboratory testing.    MEROPENEM Value in next row Sensitive      <=4 SENSITIVEThis is a modified FDA-approved test that has been validated and its performance characteristics determined by the reporting laboratory.  This laboratory is certified under the Clinical Laboratory Improvement Amendments CLIA as qualified to perform high complexity clinical laboratory testing.    * >=100,000 COLONIES/mL KLEBSIELLA PNEUMONIAE  Resp panel by RT-PCR (RSV, Flu A&B, Covid) Anterior Nasal Swab     Status: None   Collection Time: 05/02/24  7:26 AM   Specimen: Anterior Nasal Swab  Result Value Ref Range Status   SARS Coronavirus 2 by RT PCR NEGATIVE NEGATIVE Final   Influenza A by PCR NEGATIVE NEGATIVE Final   Influenza  B by PCR NEGATIVE NEGATIVE Final    Comment: (NOTE) The Xpert Xpress  SARS-CoV-2/FLU/RSV plus assay is intended as an aid in the diagnosis of influenza from Nasopharyngeal swab specimens and should not be used as a sole basis for treatment. Nasal washings and aspirates are unacceptable for Xpert Xpress SARS-CoV-2/FLU/RSV testing.  Fact Sheet for Patients: BloggerCourse.com  Fact Sheet for Healthcare Providers: SeriousBroker.it  This test is not yet approved or cleared by the [place]  FDA and has been authorized for detection and/or diagnosis of SARS-CoV-2 by FDA under an Emergency Use Authorization (EUA). This EUA will remain in effect (meaning this test can be used) for the duration of the COVID-19 declaration under Section 564(b)(1) of the Act, 21 U.S.C. section 360bbb-3(b)(1), unless the authorization is terminated or revoked.     Resp Syncytial Virus by PCR NEGATIVE NEGATIVE Final    Comment: (NOTE) Fact Sheet for Patients: BloggerCourse.com  Fact Sheet for Healthcare Providers: SeriousBroker.it  This test is not yet approved or cleared by the [place]  FDA and has been authorized for detection and/or diagnosis of SARS-CoV-2 by FDA under an Emergency Use Authorization (EUA). This EUA will remain in effect (meaning this test can be used) for the duration of the COVID-19 declaration under Section 564(b)(1) of the Act, 21 U.S.C. section 360bbb-3(b)(1), unless the authorization is terminated or revoked.  Performed at [facility], [address] [place], [place] [address]   Culture, blood (Routine X 2) w Reflex to ID Panel     Status: None (Preliminary result)   Collection Time: 05/03/24  7:59 AM   Specimen: BLOOD  Result Value Ref Range Status   Specimen Description BLOOD SITE NOT SPECIFIED  Final   Special Requests   Final    BOTTLES DRAWN AEROBIC AND ANAEROBIC Blood Culture results may not be optimal due to an inadequate  volume of blood received in culture bottles   Culture   Final    NO GROWTH 4 DAYS Performed at [facility], [address], [place], [place] [address]    Report Status PENDING  Incomplete  Culture, blood (Routine X 2) w Reflex to ID Panel     Status: None (Preliminary result)   Collection Time: 05/03/24  8:00 AM   Specimen: BLOOD  Result Value Ref Range Status   Specimen Description BLOOD SITE NOT SPECIFIED  Final   Special Requests   Final    BOTTLES DRAWN AEROBIC AND ANAEROBIC Blood Culture adequate volume   Culture   Final    NO GROWTH 4 DAYS Performed at [facility], [address], [place], [place] [address]    Report Status PENDING  Incomplete    RADIOLOGY STUDIES/RESULTS: No results found.    LOS: 4 days   Donalda Applebaum, MD  [facility]    To contact the attending provider between 7A-7P or the covering provider during after hours 7P-7A, please log into the web site [email or web address] and access using universal [place] password for that web site. If you do not have the password, please call the hospital operator.  05/07/2024, 10:20 AM

## 2024-05-07 NOTE — TOC Progression Note (Signed)
 Transition of Care [facility]) - Progression Note    Patient Details  Name: Lawrence [name] MRN: [id number] Date of Birth: 1955-09-28  [facility]) CM/SW Contact  Inocente GORMAN Kindle, LCSW Phone Number: 05/07/2024, 12:35 PM  Clinical Narrative:    CSW continuing to follow and updated [facility].      Barriers to Discharge: Continued Medical Work up               Expected Discharge Plan and Services                                               Social Drivers of Health (SDOH) Interventions SDOH Screenings   Food Insecurity: No Food Insecurity (05/02/2024)  Housing: High Risk (05/02/2024)  Transportation Needs: No Transportation Needs (05/03/2024)  Utilities: Not At Risk (05/02/2024)  Alcohol  Screen: Low Risk  (06/08/2021)  Depression (PHQ2-9): High Risk (02/26/2024)  Financial Resource Strain: Medium Risk (12/06/2021)   Received from [facility]  Physical Activity: Unknown (12/06/2021)   Received from [facility]  Social Connections: Socially Isolated (05/03/2024)  Stress: No Stress Concern Present (01/21/2022)   Received from [facility]  Recent Concern: Stress - Stress Concern Present (11/19/2021)   Received from [facility]  Tobacco Use: Medium Risk (05/02/2024)    Readmission Risk Interventions    11/28/2023    2:45 PM 07/24/2023   11:56 AM 05/08/2023    4:37 PM  Readmission Risk Prevention Plan  Transportation Screening Complete Complete Complete  Medication Review Oceanographer) Complete Complete Complete  PCP or Specialist appointment within 3-5 days of discharge Complete Complete Complete  HRI or Home Care Consult Complete Complete Complete  SW Recovery Care/Counseling Consult Complete Complete Complete  Palliative Care Screening Not Applicable Not Applicable Complete  Skilled Nursing Facility Complete Complete Not Applicable

## 2024-05-07 NOTE — Progress Notes (Signed)
 PT Cancellation Note  Patient Details Name: Lawrence [name] MRN: [id number] DOB: 03/29/1956   Cancelled Treatment:    Reason Eval/Treat Not Completed: Patient at procedure or test/unavailable. Pt off the floor at CT. PT to return as able to progress mobility.  Bradleigh Sonnen, PT, DPT Acute Rehabilitation Services Secure chat preferred Office #: [phone]    Lawrence [name] 05/07/2024, 11:06 AM

## 2024-05-07 NOTE — Plan of Care (Signed)

## 2024-05-08 DIAGNOSIS — I69354 Hemiplegia and hemiparesis following cerebral infarction affecting left non-dominant side: Secondary | ICD-10-CM | POA: Diagnosis not present

## 2024-05-08 DIAGNOSIS — Z794 Long term (current) use of insulin: Secondary | ICD-10-CM

## 2024-05-08 DIAGNOSIS — E876 Hypokalemia: Secondary | ICD-10-CM | POA: Diagnosis not present

## 2024-05-08 DIAGNOSIS — E1142 Type 2 diabetes mellitus with diabetic polyneuropathy: Secondary | ICD-10-CM

## 2024-05-08 DIAGNOSIS — N3000 Acute cystitis without hematuria: Secondary | ICD-10-CM | POA: Diagnosis not present

## 2024-05-08 LAB — CULTURE, BLOOD (ROUTINE X 2)
Culture: NO GROWTH
Culture: NO GROWTH
Special Requests: ADEQUATE

## 2024-05-08 LAB — GLUCOSE, CAPILLARY
Glucose-Capillary: 240 mg/dL — ABNORMAL HIGH (ref 70–99)
Glucose-Capillary: 248 mg/dL — ABNORMAL HIGH (ref 70–99)

## 2024-05-08 LAB — BASIC METABOLIC PANEL WITH GFR
Anion gap: 9 (ref 5–15)
BUN: 11 mg/dL (ref 8–23)
CO2: 24 mmol/L (ref 22–32)
Calcium: 8.9 mg/dL (ref 8.9–10.3)
Chloride: 103 mmol/L (ref 98–111)
Creatinine, Ser: 0.8 mg/dL (ref 0.61–1.24)
GFR, Estimated: >60 mL/min (ref >60)
Glucose, Bld: 160 mg/dL — ABNORMAL HIGH (ref 70–99)
Potassium: 3.1 mmol/L — ABNORMAL LOW (ref 3.5–5.1)
Sodium: 136 mmol/L (ref 135–145)

## 2024-05-08 MED ORDER — HYDROCODONE-ACETAMINOPHEN 5-325 MG PO TABS: 1.0000 | ORAL_TABLET | Freq: Four times a day (QID) | ORAL | 0 refills | Status: AC | PRN

## 2024-05-08 MED ORDER — MIDODRINE HCL 5 MG PO TABS: 7.5000 mg | ORAL_TABLET | Freq: Three times a day (TID) | ORAL | Status: AC

## 2024-05-08 MED ORDER — POTASSIUM CHLORIDE CRYS ER 20 MEQ PO TBCR
40.0000 meq | EXTENDED_RELEASE_TABLET | Freq: Once | ORAL | Status: AC
  Administered 2024-05-08: 40 meq via ORAL
  Filled 2024-05-08: qty 2

## 2024-05-08 MED ORDER — TORSEMIDE 20 MG PO TABS: 20.0000 mg | ORAL_TABLET | Freq: Every day | ORAL | Status: AC

## 2024-05-08 MED ORDER — METHOCARBAMOL 500 MG PO TABS: 500.0000 mg | ORAL_TABLET | Freq: Three times a day (TID) | ORAL | Status: AC | PRN

## 2024-05-08 NOTE — TOC Progression Note (Signed)
 Transition of Care [facility]) - Progression Note    Patient Details  Name: Lawrence [name] MRN: [id number] Date of Birth: 26-Apr-1956  Transition of Care [facility]) CM/SW Contact  Inocente GORMAN Kindle, LCSW Phone Number: 05/08/2024, 12:38 PM  Clinical Narrative:    CSW met with patient and significant other at bedside. They stated they are wanting a letter that states patient would benefit from a private room at SNF so that patient can have access to a sink outside of the bathroom. CSW advised them to obtain a letter from his MD or [facility] MD and they reported understanding. Patient will require PTAR for transport.    Expected Discharge Plan: Skilled Nursing Facility Barriers to Discharge: Barriers Resolved               Expected Discharge Plan and Services In-house Referral: Clinical Social Work   Post Acute Care Choice: Skilled Nursing Facility Living arrangements for the past 2 months: Skilled Nursing Facility Expected Discharge Date: 05/08/24                                     Social Drivers of Health (SDOH) Interventions SDOH Screenings   Food Insecurity: No Food Insecurity (05/02/2024)  Housing: High Risk (05/02/2024)  Transportation Needs: No Transportation Needs (05/03/2024)  Utilities: Not At Risk (05/02/2024)  Alcohol  Screen: Low Risk  (06/08/2021)  Depression (PHQ2-9): High Risk (02/26/2024)  Financial Resource Strain: Medium Risk (12/06/2021)   Received from [facility]  Physical Activity: Unknown (12/06/2021)   Received from [facility]  Social Connections: Socially Isolated (05/03/2024)  Stress: No Stress Concern Present (01/21/2022)   Received from [facility]  Recent Concern: Stress - Stress Concern Present (11/19/2021)   Received from [facility]  Tobacco Use: Medium Risk (05/02/2024)    Readmission Risk Interventions    05/07/2024   12:35 PM 11/28/2023    2:45 PM 07/24/2023   11:56 AM  Readmission Risk Prevention Plan  Transportation Screening Complete  Complete Complete  Medication Review Oceanographer) Complete Complete Complete  PCP or Specialist appointment within 3-5 days of discharge Complete Complete Complete  HRI or Home Care Consult Complete Complete Complete  SW Recovery Care/Counseling Consult Complete Complete Complete  Palliative Care Screening Not Applicable Not Applicable Not Applicable  Skilled Nursing Facility Complete Complete Complete

## 2024-05-08 NOTE — Plan of Care (Incomplete)
  Problem: Education: Goal: Knowledge of General Education information will improve Description: Including pain rating scale, medication(s)/side effects and non-pharmacologic comfort measures Outcome: Progressing   Problem: Health Behavior/Discharge Planning: Goal: Ability to manage health-related needs will improve Outcome: Progressing   Problem: Clinical Measurements: Goal: Diagnostic test results will improve Outcome: Progressing   Problem: Activity: Goal: Risk for activity intolerance will decrease Outcome: Progressing   Problem: Health Behavior/Discharge Planning: Goal: Ability to manage health-related needs will improve Outcome: Progressing

## 2024-05-08 NOTE — Discharge Summary (Signed)
 PATIENT DETAILS Name: Lawrence [name] Age: 68 y.o. Sex: male Date of Birth: [birth year] MRN: [id number]. Admitting Physician: Redia LOISE Cleaver, MD ERE:Tyjmunw, Charmaine RIGGERS  Admit Date: 05/02/2024 Discharge date: 05/08/2024  Recommendations for Outpatient Follow-up:  Follow up with PCP in 1-2 weeks Please obtain CMP/CBC in one week Please ensure follow-up with outpatient neurology  Admitted From:  SNF  Disposition: Skilled nursing facility   Discharge Condition: good  CODE STATUS:   Code Status: Limited: Do not attempt resuscitation (DNR) -DNR-LIMITED -Do Not Intubate/DNI    Diet recommendation:  Diet Order             Diet - low sodium heart healthy           Diet Carb Modified           Diet regular Room service appropriate? Yes; Fluid consistency: Thin  Diet effective now                    Brief Summary: Patient is a 68 y.o.  male with  history of CVA with residual left-sided weakness, seizure disorder, chronic HFpEF, A-fib on Eliquis , AAS-s/p TAVR in July 2024, COPD, OSA on CPAP-who presented to the ED-after being found on the floor by SNF staff-he was brought to the ED where he was noted to have left-sided facial droop/right-sided weakness-leukocytosis-UA suspicious for UTI-he was thought to have recrudescence of strokelike symptoms due to possible UTI (similar multiple instances in the past)    Significant events: 9/25>> admit to [facility]   Significant studies: 7/31>> echo: EF 55-60% 9/25>> CT head: No acute intracranial abnormality 9/25>> CXR: Mild interstitial prominence. 9/30>>  CT head: No acute intracranial abnormality   Significant microbiology data: 9/25>> COVID/influenza/RSV PCR: Negative 9/25>> urine culture: Klebsiella/group B strep. 9/26>> blood culture: No growth   Procedures: None   Consults: Neurology  Brief Hospital Course: Sepsis secondary to complicated UTI Sepsis physiology improved Afebrile-leukocytosis downtrending   IV Rocephin  x 5 days total-completed 9/29.   Right-sided weakness Given prior history of similar instances of weakness with infectious issues-it is currently felt that this is probably recrudescence of CVA rather than acute CVA. Unable to proceed with MRI brain/C-spine due to impedance issues with spinal cord stimulator--discussed with neurology-Dr. Lindzen 9/30-subsequently repeat CT head was obtained which did not show any acute abnormalitie.  This morning patient was seen with his significant other-and he has significant improvement in his right-sided weakness-and is close to baseline.  Dr. Lindzen suggest that this may have been functional-no further workup recommended-Will place referral for outpatient neurology follow-up.  PAF Telemetry monitoring Eliquis    Chronic HFpEF Volume status stable Continue Demadex .   Chronic hypotension BP soft but stable Remains on midodrine .   Hypokalemia Replete/recheck.   Seizure disorder Keppra /Vimpat    RLS Requip    Peripheral neuropathy Neurontin .   COPD Stable Not in exacerbation Continue bronchodilators   DM-2 (A1c 7.0 on 5/12) CBG stable SSI  HLD Statin/fenofibrate    Mood disorder Restless but otherwise stable this morning Continue Abilify /Effexor /trazodone   Class 2 Obesity: Estimated body mass index is 39.85 kg/m as calculated from the following:   Height as of this encounter: 6' 2 (1.88 m).   Weight as of this encounter: 140.8 kg.    Pressure Ulcer: Agree with assessment as outlined below.  Wound 05/02/24 1000 Pressure Injury Coccyx Medial Stage 1 -  Intact skin with non-blanchable redness of a localized area usually over a bony prominence. (Active)     Wound 05/02/24 1116  Pressure Injury Buttocks Left Deep Tissue Pressure Injury - Purple or maroon localized area of discolored intact skin or blood-filled blister due to damage of underlying soft tissue from pressure and/or shear. (Active)    Discharge Diagnoses:   Principal Problem:   Hypokalemia   Discharge Instructions:  Activity:  As tolerated with Full fall precautions use walker/cane & assistance as needed   Discharge Instructions     Ambulatory referral to Neurology   Complete by: As directed    An appointment is requested in approximately: 4 weeks   Call MD for:  extreme fatigue   Complete by: As directed    Call MD for:  persistant dizziness or light-headedness   Complete by: As directed    Diet - low sodium heart healthy   Complete by: As directed    Diet Carb Modified   Complete by: As directed    Discharge instructions   Complete by: As directed    Follow with Primary MD  Katina Pfeiffer, PA-C in 1-2 weeks  Please get a complete blood count and chemistry panel checked by your Primary MD at your next visit, and again as instructed by your Primary MD.  Get Medicines reviewed and adjusted: Please take all your medications with you for your next visit with your Primary MD  Laboratory/radiological data: Please request your Primary MD to go over all hospital tests and procedure/radiological results at the follow up, please ask your Primary MD to get all Hospital records sent to his/her office.  In some cases, they will be blood work, cultures and biopsy results pending at the time of your discharge. Please request that your primary care M.D. follows up on these results.  Also Note the following: If you experience worsening of your admission symptoms, develop shortness of breath, life threatening emergency, suicidal or homicidal thoughts you must seek medical attention immediately by calling 911 or calling your MD immediately  if symptoms less severe.  You must read complete instructions/literature along with all the possible adverse reactions/side effects for all the Medicines you take and that have been prescribed to you. Take any new Medicines after you have completely understood and accpet all the possible adverse  reactions/side effects.   Do not drive when taking Pain medications or sleeping medications (Benzodaizepines)  Do not take more than prescribed Pain, Sleep and Anxiety Medications. It is not advisable to combine anxiety,sleep and pain medications without talking with your primary care practitioner  Special Instructions: If you have smoked or chewed Tobacco  in the last 2 yrs please stop smoking, stop any regular Alcohol   and or any Recreational drug use.  Wear Seat belts while driving.  Please note: You were cared for by a hospitalist during your hospital stay. Once you are discharged, your primary care physician will handle any further medical issues. Please note that NO REFILLS for any discharge medications will be authorized once you are discharged, as it is imperative that you return to your primary care physician (or establish a relationship with a primary care physician if you do not have one) for your post hospital discharge needs so that they can reassess your need for medications and monitor your lab values.   Discharge wound care:   Complete by: As directed    Cleanse with warm water and saline in the open wound on L buttock. Pat dry the peri-wound skin. Cover both areas with foam dressing, ok lift the foam and reassess every shift. The foam can stay up to  3 days, change before if saturated or PRN soiling. Perineum is intact, use the barrier cream available at clean storage daily and after cleaning.   Increase activity slowly   Complete by: As directed       Allergies as of 05/08/2024       Reactions   Niacin And Related Other (See Comments)   Redness, flushing   Ppd [tuberculin Purified Protein Derivative]    Unknown rxn (MAR)   Tubersol [tuberculin, Ppd] Other (See Comments)   Unknown rxn (MAR)   Firvanq  [vancomycin ] Other (See Comments)   Red man syndrome   Vibramycin  [doxycycline ] Rash        Medication List     TAKE these medications    acetaminophen  650 MG CR  tablet Commonly known as: TYLENOL  Take 650 mg by mouth every 8 (eight) hours as needed for pain.   albuterol  108 (90 Base) MCG/ACT inhaler Commonly known as: VENTOLIN  HFA Inhale 2 puffs into the lungs every 6 (six) hours as needed for wheezing or shortness of breath.   allopurinol  100 MG tablet Commonly known as: Zyloprim  Take 1 tablet (100 mg total) by mouth daily.   apixaban  5 MG Tabs tablet Commonly known as: Eliquis  Take 1 tablet (5 mg total) by mouth 2 (two) times daily.   ARIPiprazole  5 MG tablet Commonly known as: ABILIFY  TAKE 1 TABLET BY MOUTH EVERYDAY AT BEDTIME   atorvastatin  80 MG tablet Commonly known as: LIPITOR  Take 1 tablet (80 mg total) by mouth at bedtime.   bisacodyl  5 MG EC tablet Commonly known as: DULCOLAX Take 10 mg by mouth daily as needed for moderate constipation.   colchicine  0.6 MG tablet Take 1 tablet (0.6 mg total) by mouth daily.   collagenase 250 UNIT/GM ointment Commonly known as: SANTYL Apply 1 Application topically daily. Apply to right posterior thigh topically   cyanocobalamin  1000 MCG tablet Commonly known as: VITAMIN B12 Take 1,000 mcg by mouth daily.   fenofibrate  145 MG tablet Commonly known as: Tricor  Take 1 tablet (145 mg total) by mouth daily.   ferrous sulfate  325 (65 FE) MG tablet Take 325 mg by mouth daily.   fluticasone  50 MCG/ACT nasal spray Commonly known as: FLONASE  Place 2 sprays into both nostrils daily.   guaiFENesin -dextromethorphan 100-10 MG/5ML syrup Commonly known as: ROBITUSSIN DM Take 5 mLs by mouth every 4 (four) hours as needed for cough.   HM LIDOCAINE  PATCH EX Apply 1 patch topically in the morning. Apply to right foot   HumaLOG KwikPen 100 UNIT/ML KwikPen Generic drug: insulin  lispro Inject 2-15 Units into the skin. 121-150 4 units 151-200 6 units  201-250 8 units  251-300 11 units  301-350 14 units  351-400 18 units   HYDROcodone -acetaminophen  5-325 MG tablet Commonly known as:  NORCO/VICODIN Take 1 tablet by mouth every 6 (six) hours as needed for severe pain (pain score 7-10).   Incruse Ellipta  62.5 MCG/ACT Aepb Generic drug: umeclidinium bromide  Inhale 1 puff into the lungs daily.   Lacosamide  150 MG Tabs Take 1 tablet (150 mg total) by mouth 2 (two) times daily.   levETIRAcetam  500 MG tablet Commonly known as: KEPPRA  Take 1 tablet (500 mg total) by mouth 2 (two) times daily.   loperamide  2 MG capsule Commonly known as: IMODIUM  Take 2 mg by mouth daily as needed for diarrhea or loose stools.   methocarbamol  500 MG tablet Commonly known as: ROBAXIN  Take 1 tablet (500 mg total) by mouth every 8 (eight) hours as needed  for muscle spasms. What changed:  when to take this reasons to take this   metolazone  2.5 MG tablet Commonly known as: ZAROXOLYN  Take 2.5 mg by mouth once a week. Every Tuesday.   midodrine  5 MG tablet Commonly known as: PROAMATINE  Take 1.5 tablets (7.5 mg total) by mouth 3 (three) times daily with meals. What changed: how much to take   Mounjaro  15 MG/0.5ML Pen Generic drug: tirzepatide  INJECT 15 MG INTO THE SKIN ONCE A WEEK. What changed: See the new instructions.   omega-3 acid ethyl esters 1 g capsule Commonly known as: LOVAZA  Take 2 g by mouth 2 (two) times daily.   omeprazole 20 MG capsule Commonly known as: PRILOSEC Take 20 mg by mouth daily.   polyethylene glycol powder 17 GM/SCOOP powder Commonly known as: GLYCOLAX /MIRALAX  Mix in water as directed and take 1 capful (17 g) by mouth daily.   Potassium Chloride  ER 20 MEQ Tbcr Take 40 mEq by mouth daily.   prazosin  1 MG capsule Commonly known as: MINIPRESS  Take 1 mg by mouth at bedtime.   pregabalin  150 MG capsule Commonly known as: LYRICA  Take 1 capsule (150 mg total) by mouth 3 (three) times daily.   PROTEIN PO Take 30 mLs by mouth in the morning and at bedtime.   rOPINIRole  0.25 MG tablet Commonly known as: REQUIP  Take 1 tablet (0.25 mg total) by mouth  2 (two) times daily.   sennosides-docusate sodium  8.6-50 MG tablet Commonly known as: SENOKOT-S Take 2 tablets by mouth every 12 (twelve) hours as needed for constipation.   spironolactone  25 MG tablet Commonly known as: ALDACTONE  Take 12.5 mg by mouth daily.   torsemide  20 MG tablet Commonly known as: DEMADEX  Take 1 tablet (20 mg total) by mouth daily. What changed: when to take this   traZODone  150 MG tablet Commonly known as: DESYREL  Take 1 tablet (150 mg total) by mouth at bedtime.   venlafaxine  XR 150 MG 24 hr capsule Commonly known as: EFFEXOR -XR Take 150 mg by mouth daily with breakfast.               Discharge Care Instructions  (From admission, onward)           Start     Ordered   05/08/24 0000  Discharge wound care:       Comments: Cleanse with warm water and saline in the open wound on L buttock. Pat dry the peri-wound skin. Cover both areas with foam dressing, ok lift the foam and reassess every shift. The foam can stay up to 3 days, change before if saturated or PRN soiling. Perineum is intact, use the barrier cream available at clean storage daily and after cleaning.   05/08/24 0850            Follow-up Information     Katina Pfeiffer, PA-C. Schedule an appointment as soon as possible for a visit in 1 week(s).   Specialty: Family Medicine Contact information: [address] [place] [address] [phone]                Allergies  Allergen Reactions   Niacin And Related Other (See Comments)    Redness, flushing   Ppd [Tuberculin Purified Protein Derivative]     Unknown rxn (MAR)   Tubersol [Tuberculin, Ppd] Other (See Comments)    Unknown rxn (MAR)   Firvanq  [Vancomycin ] Other (See Comments)    Red man syndrome   Vibramycin  [Doxycycline ] Rash     Other Procedures/Studies: CT  HEAD WO CONTRAST ( ) Result Date: 05/07/2024 CLINICAL DATA:  68 year old male code stroke presentation 05/02/2024. EXAM: CT HEAD  WITHOUT CONTRAST TECHNIQUE: Contiguous axial images were obtained from the base of the skull through the vertex without intravenous contrast. RADIATION DOSE REDUCTION: This exam was performed according to the departmental dose-optimization program which includes automated exposure control, adjustment of the mA and/or kV according to patient size and/or use of iterative reconstruction technique. COMPARISON:  Brain MRI 03/06/2024.  Head CT 05/02/2024. FINDINGS: Brain: Cerebral volume is stable, within normal limits for age. No midline shift, ventriculomegaly, mass effect, evidence of mass lesion, intracranial hemorrhage or evidence of cortically based acute infarction. Gray-white matter differentiation is within normal limits throughout the brain. Vascular: No suspicious intracranial vascular hyperdensity. Calcified atherosclerosis at the skull base. Skull: Stable and intact. Sinuses/Orbits: Scattered paranasal sinus mucosal thickening, bubbly opacity, opacification not significantly changed. Tympanic cavities and mastoids remain well aerated. Other: No gaze deviation. No acute orbit or scalp soft tissue finding. IMPRESSION: 1. Stable and normal for age noncontrast CT appearance of the brain. 2. Mild to moderate paranasal sinus inflammation, stable. Electronically Signed   By: VEAR Hurst M.D.   On: 05/07/2024 11:24   DG Chest Portable 1 View Result Date: 05/02/2024 EXAM: 1 VIEW XRAY OF THE CHEST 05/02/2024 04:19:00 AM COMPARISON: 04/14/2024 CLINICAL HISTORY: white cough, ams. Pt bib ems coming from alf for LT facial droop and RT arm weakness, last known normal 1:30am. Takes thinners, med hx. Afib. Assessed by neuro MD, CT done, labs sent, placed on [place] 2 LT current O2 94, staring O2 89. FINDINGS: LUNGS AND PLEURA: Mild diffuse increase in interstitial markings. Low lung volumes. No pulmonary edema. No pleural effusion. No pneumothorax. HEART AND MEDIASTINUM: Cardiac enlargement. Status post TAVR. BONES AND SOFT  TISSUES: No acute osseous abnormality. IMPRESSION: 1. Mild diffuse interstitial prominence, which may be accentuated by low lung volumes. Findings may reflect pulmonary edema versus atypical inflammation/infection. 2. Cardiac enlargement. Electronically signed by: Waddell Calk MD 05/02/2024 05:22 AM EDT RP Workstation: [id number]   CT HEAD CODE STROKE WO CONTRAST Result Date: 05/02/2024 CLINICAL DATA:  Code stroke. Initial evaluation for acute neuro deficit, stroke suspected. Left facial droop, right arm weakness, slurred speech. EXAM: CT HEAD WITHOUT CONTRAST TECHNIQUE: Contiguous axial images were obtained from the base of the skull through the vertex without intravenous contrast. RADIATION DOSE REDUCTION: This exam was performed according to the departmental dose-optimization program which includes automated exposure control, adjustment of the mA and/or kV according to patient size and/or use of iterative reconstruction technique. COMPARISON:  CT from 04/14/2024. FINDINGS: Brain: Cerebral volume within normal limits. No acute mild chronic microvascular ischemic disease. No acute intracranial hemorrhage. No acute large vessel territory infarct. No mass lesion or midline shift. No hydrocephalus or extra-axial fluid collection. Vascular: No asymmetric hyperdense vessel. Calcified atherosclerosis present at the skull base. Skull: Scalp soft tissues within normal limits.  Calvarium intact. Sinuses/Orbits: Globes and orbital soft tissues within normal limits. Mild scattered mucosal thickening present about the sphenoid ethmoidal and maxillary sinuses. No significant mastoid effusion. Other: None. ASPECTS [facility] Stroke Program Early CT Score) - Ganglionic level infarction (caudate, lentiform nuclei, internal capsule, insula, M1-M3 cortex): 7 - Supraganglionic infarction (M4-M6 cortex): 3 Total score (0-10 with 10 being normal): 10 IMPRESSION: 1. No acute intracranial abnormality. 2. ASPECTS is 10. 3. Mild chronic  microvascular ischemic disease. These results were communicated to Dr. Vanessa at 3:03 am on 05/02/2024 by text page via the [facility] messaging system.  Electronically Signed   By: Morene Hoard M.D.   On: 05/02/2024 03:05   CT HEAD WO CONTRAST Result Date: 04/14/2024 EXAM: CT HEAD, FACIAL BONES AND CERVICAL SPINE WITHOUT CONTRAST 04/14/2024 07:10:13 PM TECHNIQUE: CT of the head, facial bones and cervical spine was performed without the administration of intravenous contrast. Multiplanar reformatted images are provided for review. Automated exposure control, iterative reconstruction, and/or weight based adjustment of the mA/kV was utilized to reduce the radiation dose to as low as reasonably achievable. COMPARISON: CT head 03/06/2024; MRI head 03/06/2024; CT cervical spine 01/06/2024. CLINICAL HISTORY: Head trauma, moderate-severe. 100mL Omni 350 IV. Patient is from [facility], was found on the floor tonight by staff. He possibly rolled out of his wheelchair while watching a football game. Positive blood thinners, laceration to left forehead and bridge of his nose. He does not remember if he lost consciousness. History of stroke with residual left sided weakness. FINDINGS: CT HEAD BRAIN AND VENTRICLES: No acute intracranial hemorrhage. No mass effect or midline shift. No extra-axial fluid collection. No evidence of acute infarct. No hydrocephalus. SKULL AND SCALP: No acute skull fracture. Left forehead hematoma. CT FACIAL BONES FACIAL BONES: No acute facial fracture. No mandibular dislocation. No suspicious bone lesion. ORBITS: No acute traumatic injury. SINUSES AND MASTOIDS: Mild mucosal thickening in the ethmoid air cells and sphenoid sinuses. The mastoid air cells are well aerated. SOFT TISSUES: No acute abnormality. CT CERVICAL SPINE BONES AND ALIGNMENT: No acute fracture or traumatic malalignment. DEGENERATIVE CHANGES: Degenerative changes in the cervical spine are greatest at C5-C6, C6-C7, and C7-T1,  where it is moderate to advanced. Advanced facet arthropathy on the left at C3-C4. Posterior disc osteophyte complexes at C5-C6 and C6-C7 cause mild effacement of the ventral thecal sac. SOFT TISSUES: No prevertebral soft tissue swelling. IMPRESSION: 1. No acute intracranial abnormality. 2. Left forehead hematoma. No calvarial fracture. 3. No acute facial fracture. 4. No acute fracture or traumatic malalignment of the cervical spine. Electronically signed by: Norman Gatlin MD 04/14/2024 07:22 PM EDT RP Workstation: [id number]   CT MAXILLOFACIAL WO CONTRAST Result Date: 04/14/2024 EXAM: CT HEAD, FACIAL BONES AND CERVICAL SPINE WITHOUT CONTRAST 04/14/2024 07:10:13 PM TECHNIQUE: CT of the head, facial bones and cervical spine was performed without the administration of intravenous contrast. Multiplanar reformatted images are provided for review. Automated exposure control, iterative reconstruction, and/or weight based adjustment of the mA/kV was utilized to reduce the radiation dose to as low as reasonably achievable. COMPARISON: CT head 03/06/2024; MRI head 03/06/2024; CT cervical spine 01/06/2024. CLINICAL HISTORY: Head trauma, moderate-severe. 100mL Omni 350 IV. Patient is from [facility], was found on the floor tonight by staff. He possibly rolled out of his wheelchair while watching a football game. Positive blood thinners, laceration to left forehead and bridge of his nose. He does not remember if he lost consciousness. History of stroke with residual left sided weakness. FINDINGS: CT HEAD BRAIN AND VENTRICLES: No acute intracranial hemorrhage. No mass effect or midline shift. No extra-axial fluid collection. No evidence of acute infarct. No hydrocephalus. SKULL AND SCALP: No acute skull fracture. Left forehead hematoma. CT FACIAL BONES FACIAL BONES: No acute facial fracture. No mandibular dislocation. No suspicious bone lesion. ORBITS: No acute traumatic injury. SINUSES AND MASTOIDS: Mild mucosal thickening  in the ethmoid air cells and sphenoid sinuses. The mastoid air cells are well aerated. SOFT TISSUES: No acute abnormality. CT CERVICAL SPINE BONES AND ALIGNMENT: No acute fracture or traumatic malalignment. DEGENERATIVE CHANGES: Degenerative changes in the cervical spine  are greatest at C5-C6, C6-C7, and C7-T1, where it is moderate to advanced. Advanced facet arthropathy on the left at C3-C4. Posterior disc osteophyte complexes at C5-C6 and C6-C7 cause mild effacement of the ventral thecal sac. SOFT TISSUES: No prevertebral soft tissue swelling. IMPRESSION: 1. No acute intracranial abnormality. 2. Left forehead hematoma. No calvarial fracture. 3. No acute facial fracture. 4. No acute fracture or traumatic malalignment of the cervical spine. Electronically signed by: Norman Gatlin MD 04/14/2024 07:22 PM EDT RP Workstation: [id number]   CT CERVICAL SPINE WO CONTRAST Result Date: 04/14/2024 EXAM: CT HEAD, FACIAL BONES AND CERVICAL SPINE WITHOUT CONTRAST 04/14/2024 07:10:13 PM TECHNIQUE: CT of the head, facial bones and cervical spine was performed without the administration of intravenous contrast. Multiplanar reformatted images are provided for review. Automated exposure control, iterative reconstruction, and/or weight based adjustment of the mA/kV was utilized to reduce the radiation dose to as low as reasonably achievable. COMPARISON: CT head 03/06/2024; MRI head 03/06/2024; CT cervical spine 01/06/2024. CLINICAL HISTORY: Head trauma, moderate-severe. 100mL Omni 350 IV. Patient is from [facility], was found on the floor tonight by staff. He possibly rolled out of his wheelchair while watching a football game. Positive blood thinners, laceration to left forehead and bridge of his nose. He does not remember if he lost consciousness. History of stroke with residual left sided weakness. FINDINGS: CT HEAD BRAIN AND VENTRICLES: No acute intracranial hemorrhage. No mass effect or midline shift. No extra-axial fluid  collection. No evidence of acute infarct. No hydrocephalus. SKULL AND SCALP: No acute skull fracture. Left forehead hematoma. CT FACIAL BONES FACIAL BONES: No acute facial fracture. No mandibular dislocation. No suspicious bone lesion. ORBITS: No acute traumatic injury. SINUSES AND MASTOIDS: Mild mucosal thickening in the ethmoid air cells and sphenoid sinuses. The mastoid air cells are well aerated. SOFT TISSUES: No acute abnormality. CT CERVICAL SPINE BONES AND ALIGNMENT: No acute fracture or traumatic malalignment. DEGENERATIVE CHANGES: Degenerative changes in the cervical spine are greatest at C5-C6, C6-C7, and C7-T1, where it is moderate to advanced. Advanced facet arthropathy on the left at C3-C4. Posterior disc osteophyte complexes at C5-C6 and C6-C7 cause mild effacement of the ventral thecal sac. SOFT TISSUES: No prevertebral soft tissue swelling. IMPRESSION: 1. No acute intracranial abnormality. 2. Left forehead hematoma. No calvarial fracture. 3. No acute facial fracture. 4. No acute fracture or traumatic malalignment of the cervical spine. Electronically signed by: Norman Gatlin MD 04/14/2024 07:22 PM EDT RP Workstation: [id number]   CT CHEST ABDOMEN PELVIS W CONTRAST Result Date: 04/14/2024 CLINICAL DATA:  Found down, anticoagulated, facial trauma EXAM: CT CHEST, ABDOMEN, AND PELVIS WITH CONTRAST TECHNIQUE: Multidetector CT imaging of the chest, abdomen and pelvis was performed following the standard protocol during bolus administration of intravenous contrast. RADIATION DOSE REDUCTION: This exam was performed according to the departmental dose-optimization program which includes automated exposure control, adjustment of the mA and/or kV according to patient size and/or use of iterative reconstruction technique. CONTRAST:  OMNIPAQUE  IOHEXOL  350 MG/ML SOLN COMPARISON:  11/27/2023 FINDINGS: CT CHEST FINDINGS Cardiovascular: The heart is unremarkable without pericardial effusion. Aortic valve  prosthesis is again noted. No evidence of thoracic aortic aneurysm or dissection. Atherosclerosis of the aorta and coronary vasculature. Mediastinum/Nodes: No enlarged mediastinal, hilar, or axillary lymph nodes. Thyroid  gland, trachea, and esophagus demonstrate no significant findings. Lungs/Pleura: Stable emphysematous changes at the lung apices. No acute airspace disease, effusion, or pneumothorax. The central airways are patent. Musculoskeletal: No acute or destructive bony abnormalities. Reconstructed images demonstrate no  additional findings. CT ABDOMEN PELVIS FINDINGS Hepatobiliary: No hepatic injury or perihepatic hematoma. Gallbladder is unremarkable. Pancreas: Unremarkable. No pancreatic ductal dilatation or surrounding inflammatory changes. Spleen: No splenic injury or perisplenic hematoma. Adrenals/Urinary Tract: No adrenal hemorrhage or renal injury identified. Bladder is unremarkable. Stomach/Bowel: No bowel obstruction or ileus. Normal appendix right lower quadrant. Colonic diverticulosis without diverticulitis. No bowel wall thickening or inflammatory change. Vascular/Lymphatic: Aortic atherosclerosis. No enlarged abdominal or pelvic lymph nodes. Reproductive: Prostate is unremarkable. Other: No free fluid or free intraperitoneal gas. No abdominal wall hernia. Musculoskeletal: No acute or destructive bony abnormalities. Stable spinal stimulator. Reconstructed images demonstrate no additional findings. IMPRESSION: 1. No acute intrathoracic, intra-abdominal, or intrapelvic trauma. 2. Colonic diverticulosis without diverticulitis. 3. Aortic Atherosclerosis (ICD10-I70.0) and Emphysema (ICD10-J43.9). Electronically Signed   By: Ozell Daring M.D.   On: 04/14/2024 19:21   DG Knee Right Port Result Date: 04/14/2024 CLINICAL DATA:  Fall from wheelchair. EXAM: PORTABLE RIGHT KNEE - 1-2 VIEW COMPARISON:  None available FINDINGS: Advanced tricompartment osteoarthritis with complete joint space loss and  extensive osteophyte formation. Moderate joint effusion. There is lateral and posterior subluxation of the tibia relative to the femur, likely a chronic process but age indeterminate without prior studies. No fracture. IMPRESSION: Advanced tricompartment osteoarthritis with moderate joint effusion. Lateral and posterior subluxation, age indeterminate but favor chronic. Electronically Signed   By: Franky Crease M.D.   On: 04/14/2024 19:04   DG Pelvis Portable Result Date: 04/14/2024 CLINICAL DATA:  Fall from wheelchair EXAM: PORTABLE PELVIS 1-2 VIEWS COMPARISON:  None Available. FINDINGS: There is no evidence of pelvic fracture or diastasis. No pelvic bone lesions are seen. IMPRESSION: Negative. Electronically Signed   By: Franky Crease M.D.   On: 04/14/2024 19:01   DG Chest Port 1 View Result Date: 04/14/2024 CLINICAL DATA:  Trauma, fall out of wheelchair. EXAM: PORTABLE CHEST 1 VIEW COMPARISON:  12/15/2023 FINDINGS: Cardiomegaly. No confluent opacities, effusions or edema. No pneumothorax. No acute bony abnormality. IMPRESSION: Cardiomegaly.  No active disease. Electronically Signed   By: Franky Crease M.D.   On: 04/14/2024 19:00     TODAY-DAY OF DISCHARGE:  Subjective:   Lawrence [name] today has no headache,no chest abdominal pain,no new weakness tingling or numbness, feels much better wants to go home today.   Objective:   Blood pressure 136/75, pulse 76, temperature (!) 97.4 F (36.3 C), temperature source Oral, resp. rate 15, height 6' 2 (1.88 m), weight (!) 140.8 kg, SpO2 97%.  Intake/Output Summary (Last 24 hours) at 05/08/2024 0852 Last data filed at 05/07/2024 1700 Gross per 24 hour  Intake --  Output 1525 ml  Net -1525 ml   Filed Weights   05/02/24 0200  Weight: (!) 140.8 kg    Exam: Awake Alert, Oriented *3, No new F.N deficits, Normal affect [place].AT,PERRAL Supple Neck,No JVD, No cervical lymphadenopathy appriciated.  Symmetrical Chest wall movement, Good air movement  bilaterally, CTAB RRR,No Gallops,Rubs or new Murmurs, No Parasternal Heave +ve B.Sounds, Abd Soft, Non tender, No organomegaly appriciated, No rebound -guarding or rigidity. No Cyanosis, Clubbing or edema, No new Rash or bruise   PERTINENT RADIOLOGIC STUDIES: CT HEAD WO CONTRAST ( ) Result Date: 05/07/2024 CLINICAL DATA:  68 year old male code stroke presentation 05/02/2024. EXAM: CT HEAD WITHOUT CONTRAST TECHNIQUE: Contiguous axial images were obtained from the base of the skull through the vertex without intravenous contrast. RADIATION DOSE REDUCTION: This exam was performed according to the departmental dose-optimization program which includes automated exposure control, adjustment of the mA and/or  kV according to patient size and/or use of iterative reconstruction technique. COMPARISON:  Brain MRI 03/06/2024.  Head CT 05/02/2024. FINDINGS: Brain: Cerebral volume is stable, within normal limits for age. No midline shift, ventriculomegaly, mass effect, evidence of mass lesion, intracranial hemorrhage or evidence of cortically based acute infarction. Gray-white matter differentiation is within normal limits throughout the brain. Vascular: No suspicious intracranial vascular hyperdensity. Calcified atherosclerosis at the skull base. Skull: Stable and intact. Sinuses/Orbits: Scattered paranasal sinus mucosal thickening, bubbly opacity, opacification not significantly changed. Tympanic cavities and mastoids remain well aerated. Other: No gaze deviation. No acute orbit or scalp soft tissue finding. IMPRESSION: 1. Stable and normal for age noncontrast CT appearance of the brain. 2. Mild to moderate paranasal sinus inflammation, stable. Electronically Signed   By: VEAR Hurst M.D.   On: 05/07/2024 11:24     PERTINENT LAB RESULTS: CBC: No results for input(s): WBC, HGB, HCT, PLT in the last 72 hours. CMET CMP     Component Value Date/Time   NA 136 05/08/2024 0312   NA 136 02/15/2023 1415   K  3.1 (L) 05/08/2024 0312   CL 103 05/08/2024 0312   CO2 24 05/08/2024 0312   GLUCOSE 160 (H) 05/08/2024 0312   BUN 11 05/08/2024 0312   BUN 37 (H) 02/15/2023 1415   CREATININE 0.80 05/08/2024 0312   CALCIUM  8.9 05/08/2024 0312   PROT 5.8 (L) 05/03/2024 0759   ALBUMIN 2.7 (L) 05/03/2024 0759   AST 15 05/03/2024 0759   ALT 17 05/03/2024 0759   ALKPHOS 53 05/03/2024 0759   BILITOT 1.4 (H) 05/03/2024 0759   EGFR 78 02/15/2023 1415   GFRNONAA >60 05/08/2024 0312    GFR Estimated Creatinine Clearance: 133.8 mL/min (by C-G formula based on SCr of 0.8 mg/dL). No results for input(s): LIPASE, AMYLASE in the last 72 hours. No results for input(s): CKTOTAL, CKMB, CKMBINDEX, TROPONINI in the last 72 hours. Invalid input(s): POCBNP No results for input(s): DDIMER in the last 72 hours. No results for input(s): HGBA1C in the last 72 hours. No results for input(s): CHOL, HDL, LDLCALC, TRIG, CHOLHDL, LDLDIRECT in the last 72 hours. No results for input(s): TSH, T4TOTAL, T3FREE, THYROIDAB in the last 72 hours.  Invalid input(s): FREET3 No results for input(s): VITAMINB12, FOLATE, FERRITIN, TIBC, IRON, RETICCTPCT in the last 72 hours. Coags: No results for input(s): INR in the last 72 hours.  Invalid input(s): PT Microbiology: Recent Results (from the past 240 hours)  Urine Culture     Status: Abnormal   Collection Time: 05/02/24  5:05 AM   Specimen: Urine, Catheterized  Result Value Ref Range Status   Specimen Description URINE, CATHETERIZED  Final   Special Requests NONE  Final   Culture (A)  Final    >=100,000 COLONIES/mL KLEBSIELLA PNEUMONIAE 20,000 COLONIES/mL GROUP B STREP(S.AGALACTIAE)ISOLATED TESTING AGAINST S. AGALACTIAE NOT ROUTINELY PERFORMED DUE TO PREDICTABILITY OF AMP/PEN/VAN SUSCEPTIBILITY. Performed at [facility], [address] [place], [place] [address]    Report Status 05/04/2024 FINAL  Final   Organism  ID, Bacteria KLEBSIELLA PNEUMONIAE (A)  Final      Susceptibility   Klebsiella pneumoniae - MIC*    AMPICILLIN  >=32 RESISTANT Resistant     CEFAZOLIN  (URINE) Value in next row Sensitive      2 SENSITIVEThis is a modified FDA-approved test that has been validated and its performance characteristics determined by the reporting laboratory.  This laboratory is certified under the Clinical Laboratory Improvement Amendments CLIA as qualified to perform  high complexity clinical laboratory testing.    CEFEPIME  Value in next row Sensitive      2 SENSITIVEThis is a modified FDA-approved test that has been validated and its performance characteristics determined by the reporting laboratory.  This laboratory is certified under the Clinical Laboratory Improvement Amendments CLIA as qualified to perform high complexity clinical laboratory testing.    ERTAPENEM Value in next row Sensitive      2 SENSITIVEThis is a modified FDA-approved test that has been validated and its performance characteristics determined by the reporting laboratory.  This laboratory is certified under the Clinical Laboratory Improvement Amendments CLIA as qualified to perform high complexity clinical laboratory testing.    CEFTRIAXONE  Value in next row Sensitive      2 SENSITIVEThis is a modified FDA-approved test that has been validated and its performance characteristics determined by the reporting laboratory.  This laboratory is certified under the Clinical Laboratory Improvement Amendments CLIA as qualified to perform high complexity clinical laboratory testing.    CIPROFLOXACIN Value in next row Sensitive      2 SENSITIVEThis is a modified FDA-approved test that has been validated and its performance characteristics determined by the reporting laboratory.  This laboratory is certified under the Clinical Laboratory Improvement Amendments CLIA as qualified to perform high complexity clinical laboratory testing.    GENTAMICIN Value in next  row Sensitive      2 SENSITIVEThis is a modified FDA-approved test that has been validated and its performance characteristics determined by the reporting laboratory.  This laboratory is certified under the Clinical Laboratory Improvement Amendments CLIA as qualified to perform high complexity clinical laboratory testing.    NITROFURANTOIN Value in next row Intermediate      2 SENSITIVEThis is a modified FDA-approved test that has been validated and its performance characteristics determined by the reporting laboratory.  This laboratory is certified under the Clinical Laboratory Improvement Amendments CLIA as qualified to perform high complexity clinical laboratory testing.    TRIMETH /SULFA  Value in next row Sensitive      2 SENSITIVEThis is a modified FDA-approved test that has been validated and its performance characteristics determined by the reporting laboratory.  This laboratory is certified under the Clinical Laboratory Improvement Amendments CLIA as qualified to perform high complexity clinical laboratory testing.    AMPICILLIN /SULBACTAM Value in next row Sensitive      2 SENSITIVEThis is a modified FDA-approved test that has been validated and its performance characteristics determined by the reporting laboratory.  This laboratory is certified under the Clinical Laboratory Improvement Amendments CLIA as qualified to perform high complexity clinical laboratory testing.    PIP/TAZO Value in next row Sensitive      <=4 SENSITIVEThis is a modified FDA-approved test that has been validated and its performance characteristics determined by the reporting laboratory.  This laboratory is certified under the Clinical Laboratory Improvement Amendments CLIA as qualified to perform high complexity clinical laboratory testing.    MEROPENEM Value in next row Sensitive      <=4 SENSITIVEThis is a modified FDA-approved test that has been validated and its performance characteristics determined by the reporting  laboratory.  This laboratory is certified under the Clinical Laboratory Improvement Amendments CLIA as qualified to perform high complexity clinical laboratory testing.    * >=100,000 COLONIES/mL KLEBSIELLA PNEUMONIAE  Resp panel by RT-PCR (RSV, Flu A&B, Covid) Anterior Nasal Swab     Status: None   Collection Time: 05/02/24  7:26 AM   Specimen: Anterior Nasal Swab  Result Value  Ref Range Status   SARS Coronavirus 2 by RT PCR NEGATIVE NEGATIVE Final   Influenza A by PCR NEGATIVE NEGATIVE Final   Influenza B by PCR NEGATIVE NEGATIVE Final    Comment: (NOTE) The Xpert Xpress SARS-CoV-2/FLU/RSV plus assay is intended as an aid in the diagnosis of influenza from Nasopharyngeal swab specimens and should not be used as a sole basis for treatment. Nasal washings and aspirates are unacceptable for Xpert Xpress SARS-CoV-2/FLU/RSV testing.  Fact Sheet for Patients: BloggerCourse.com  Fact Sheet for Healthcare Providers: SeriousBroker.it  This test is not yet approved or cleared by the [place]  FDA and has been authorized for detection and/or diagnosis of SARS-CoV-2 by FDA under an Emergency Use Authorization (EUA). This EUA will remain in effect (meaning this test can be used) for the duration of the COVID-19 declaration under Section 564(b)(1) of the Act, 21 U.S.C. section 360bbb-3(b)(1), unless the authorization is terminated or revoked.     Resp Syncytial Virus by PCR NEGATIVE NEGATIVE Final    Comment: (NOTE) Fact Sheet for Patients: BloggerCourse.com  Fact Sheet for Healthcare Providers: SeriousBroker.it  This test is not yet approved or cleared by the [place]  FDA and has been authorized for detection and/or diagnosis of SARS-CoV-2 by FDA under an Emergency Use Authorization (EUA). This EUA will remain in effect (meaning this test can be used) for the duration of  the COVID-19 declaration under Section 564(b)(1) of the Act, 21 U.S.C. section 360bbb-3(b)(1), unless the authorization is terminated or revoked.  Performed at [facility], [address] [place], [place] [address]   Culture, blood (Routine X 2) w Reflex to ID Panel     Status: None   Collection Time: 05/03/24  7:59 AM   Specimen: BLOOD  Result Value Ref Range Status   Specimen Description BLOOD SITE NOT SPECIFIED  Final   Special Requests   Final    BOTTLES DRAWN AEROBIC AND ANAEROBIC Blood Culture results may not be optimal due to an inadequate volume of blood received in culture bottles   Culture   Final    NO GROWTH 5 DAYS Performed at [facility], [address], [place], [place] [address]    Report Status 05/08/2024 FINAL  Final  Culture, blood (Routine X 2) w Reflex to ID Panel     Status: None   Collection Time: 05/03/24  8:00 AM   Specimen: BLOOD  Result Value Ref Range Status   Specimen Description BLOOD SITE NOT SPECIFIED  Final   Special Requests   Final    BOTTLES DRAWN AEROBIC AND ANAEROBIC Blood Culture adequate volume   Culture   Final    NO GROWTH 5 DAYS Performed at [facility], [address], [place], [place] [address]    Report Status 05/08/2024 FINAL  Final    FURTHER DISCHARGE INSTRUCTIONS:  Get Medicines reviewed and adjusted: Please take all your medications with you for your next visit with your Primary MD  Laboratory/radiological data: Please request your Primary MD to go over all hospital tests and procedure/radiological results at the follow up, please ask your Primary MD to get all Hospital records sent to his/her office.  In some cases, they will be blood work, cultures and biopsy results pending at the time of your discharge. Please request that your primary care M.D. goes through all the records of your hospital data and follows up on these results.  Also Note the following: If you experience worsening of your  admission symptoms, develop shortness of breath, life threatening emergency, suicidal or homicidal thoughts you must seek medical attention immediately by calling 911 or calling your MD immediately  if symptoms less severe.  You must read complete instructions/literature along with all the possible adverse reactions/side effects for all the Medicines you take and that have been prescribed to you. Take any new Medicines after you have completely understood and accpet all the possible adverse reactions/side effects.   Do not drive when taking Pain medications or sleeping medications (Benzodaizepines)  Do not take more than prescribed Pain, Sleep and Anxiety Medications. It is not advisable to combine anxiety,sleep and pain medications without talking with your primary care practitioner  Special Instructions: If you have smoked or chewed Tobacco  in the last 2 yrs please stop smoking, stop any regular Alcohol   and or any Recreational drug use.  Wear Seat belts while driving.  Please note: You were cared for by a hospitalist during your hospital stay. Once you are discharged, your primary care physician will handle any further medical issues. Please note that NO REFILLS for any discharge medications will be authorized once you are discharged, as it is imperative that you return to your primary care physician (or establish a relationship with a primary care physician if you do not have one) for your post hospital discharge needs so that they can reassess your need for medications and monitor your lab values.  Total Time spent coordinating discharge including counseling, education and face to face time equals greater than 30 minutes.  SignedBETHA Donalda Applebaum 05/08/2024 8:52 AM

## 2024-05-08 NOTE — Progress Notes (Signed)
 PT Cancellation Note  Patient Details Name: Lawrence [name] MRN: [id number] DOB: 1955/12/11   Cancelled Treatment:    Reason Eval/Treat Not Completed: (P) Patient declined, no reason specified (requesting PTA return after family member with dog visits with him.) Attempt 10:03 Am and then again at 11:04 AM RN assessing pt and giving meds, plan to return again if time. Will continue efforts per PT plan of care as schedule permits.   Connell HERO [name] 05/08/2024, 11:57 AM

## 2024-05-08 NOTE — TOC Transition Note (Signed)
 Transition of Care [facility]) - Discharge Note   Patient Details  Name: Lawrence [name] MRN: [id number] Date of Birth: 01/11/1956  [facility] CM/SW Contact:  Inocente GORMAN Kindle, LCSW Phone Number: 05/08/2024, 12:43 PM   Clinical Narrative:    Patient will DC to: [facility] Anticipated DC date: 05/08/24 Family notified: Significant other at bedside Transport by: [facility]   Per MD patient ready for DC to [facility]. RN to call report prior to discharge [phone] room 156A). RN, patient, patient's family, and facility notified of DC. Discharge Summary and FL2 sent to facility. DC packet on chart including signed DNR and script. Ambulance transport requested for patient.   CSW will sign off for now as social work intervention is no longer needed. Please consult us  again if new needs arise.     Final next level of care: Skilled Nursing Facility Barriers to Discharge: Barriers Resolved   Patient Goals and CMS Choice Patient states their goals for this hospitalization and ongoing recovery are:: Return to [facility].gov Compare Post Acute Care list provided to:: Patient Choice offered to / list presented to : Patient      Discharge Placement   Existing PASRR number confirmed : 05/08/24          Patient chooses bed at:  [name] Patient to be transferred to facility by: [facility]   Patient and family notified of of transfer: 05/08/24  Discharge Plan and Services Additional resources added to the After Visit Summary for   In-house Referral: Clinical Social Work   Post Acute Care Choice: Skilled Nursing Facility                               Social Drivers of Health (SDOH) Interventions SDOH Screenings   Food Insecurity: No Food Insecurity (05/02/2024)  Housing: High Risk (05/02/2024)  Transportation Needs: No Transportation Needs (05/03/2024)  Utilities: Not At Risk (05/02/2024)  Alcohol  Screen: Low Risk  (06/08/2021)  Depression (PHQ2-9): High  Risk (02/26/2024)  Financial Resource Strain: Medium Risk (12/06/2021)   Received from [facility]  Physical Activity: Unknown (12/06/2021)   Received from [facility]  Social Connections: Socially Isolated (05/03/2024)  Stress: No Stress Concern Present (01/21/2022)   Received from [facility]  Recent Concern: Stress - Stress Concern Present (11/19/2021)   Received from [facility]  Tobacco Use: Medium Risk (05/02/2024)     Readmission Risk Interventions    05/07/2024   12:35 PM 11/28/2023    2:45 PM 07/24/2023   11:56 AM  Readmission Risk Prevention Plan  Transportation Screening Complete Complete Complete  Medication Review Oceanographer) Complete Complete Complete  PCP or Specialist appointment within 3-5 days of discharge Complete Complete Complete  HRI or Home Care Consult Complete Complete Complete  SW Recovery Care/Counseling Consult Complete Complete Complete  Palliative Care Screening Not Applicable Not Applicable Not Applicable  Skilled Nursing Facility Complete Complete Complete

## 2024-05-09 ENCOUNTER — Telehealth: Payer: Self-pay | Admitting: Neurology

## 2024-05-09 ENCOUNTER — Ambulatory Visit: Payer: Medicare HMO | Admitting: Neurology

## 2024-05-09 ENCOUNTER — Encounter: Payer: Self-pay | Admitting: Neurology

## 2024-05-09 NOTE — Telephone Encounter (Signed)
 [facility]) called to reschedule no show appointment. Informed her had spoken with Mr. Lamison this morning and transferred him to Billing to discuss no show fee.

## 2024-05-09 NOTE — Telephone Encounter (Signed)
 Patient calling to speak with Billing about the no show fee. Transferring patient to Billing.

## 2024-05-14 ENCOUNTER — Telehealth: Payer: Self-pay | Admitting: Neurology

## 2024-05-14 DIAGNOSIS — I739 Peripheral vascular disease, unspecified: Secondary | ICD-10-CM | POA: Diagnosis not present

## 2024-05-14 DIAGNOSIS — E119 Type 2 diabetes mellitus without complications: Secondary | ICD-10-CM | POA: Diagnosis not present

## 2024-05-14 DIAGNOSIS — I5033 Acute on chronic diastolic (congestive) heart failure: Secondary | ICD-10-CM | POA: Diagnosis not present

## 2024-05-14 DIAGNOSIS — I69811 Memory deficit following other cerebrovascular disease: Secondary | ICD-10-CM | POA: Diagnosis not present

## 2024-05-14 DIAGNOSIS — I639 Cerebral infarction, unspecified: Secondary | ICD-10-CM | POA: Diagnosis not present

## 2024-05-14 DIAGNOSIS — R262 Difficulty in walking, not elsewhere classified: Secondary | ICD-10-CM | POA: Diagnosis not present

## 2024-05-14 DIAGNOSIS — I5030 Unspecified diastolic (congestive) heart failure: Secondary | ICD-10-CM | POA: Diagnosis not present

## 2024-05-14 DIAGNOSIS — I63542 Cerebral infarction due to unspecified occlusion or stenosis of left cerebellar artery: Secondary | ICD-10-CM | POA: Diagnosis not present

## 2024-05-14 NOTE — Telephone Encounter (Signed)
 I don't need to see him sooner than December. Thanks

## 2024-05-14 NOTE — Telephone Encounter (Signed)
 Patient was recently admitted to hospital for facial droop and R arm weakness. Worked up and thought to be likely recrudescence of CVA rather than acute CVA. Eval'd by Dr. Lindzen (neurology) thought may be functional. Has history of similar instances of weakness with infectious issues. Was found to have Sepsis secondary to complicated UTI. Patient is established here and has f/u with dr. Gregg 12/24-nothing sooner available at this time. Does this pt need to be worked in sooner somewhere or okay to attache referral to upcoming appt in December?  Thank you!

## 2024-05-16 DIAGNOSIS — L89890 Pressure ulcer of other site, unstageable: Secondary | ICD-10-CM | POA: Diagnosis not present

## 2024-05-24 ENCOUNTER — Ambulatory Visit: Admitting: Professional Counselor

## 2024-06-19 ENCOUNTER — Emergency Department (HOSPITAL_COMMUNITY)

## 2024-06-19 ENCOUNTER — Emergency Department (HOSPITAL_COMMUNITY)
Admission: EM | Admit: 2024-06-19 | Discharge: 2024-06-19 | Disposition: A | Discharge status: Skilled Nursing Facility | Source: Skilled Nursing Facility | Attending: Emergency Medicine | Admitting: Emergency Medicine

## 2024-06-19 DIAGNOSIS — F449 Dissociative and conversion disorder, unspecified: Secondary | ICD-10-CM

## 2024-06-19 DIAGNOSIS — R29818 Other symptoms and signs involving the nervous system: Secondary | ICD-10-CM | POA: Diagnosis not present

## 2024-06-19 DIAGNOSIS — R4182 Altered mental status, unspecified: Secondary | ICD-10-CM | POA: Diagnosis not present

## 2024-06-19 DIAGNOSIS — Z7901 Long term (current) use of anticoagulants: Secondary | ICD-10-CM | POA: Insufficient documentation

## 2024-06-19 DIAGNOSIS — G928 Other toxic encephalopathy: Secondary | ICD-10-CM

## 2024-06-19 DIAGNOSIS — G43109 Migraine with aura, not intractable, without status migrainosus: Secondary | ICD-10-CM

## 2024-06-19 DIAGNOSIS — R569 Unspecified convulsions: Secondary | ICD-10-CM | POA: Diagnosis not present

## 2024-06-19 DIAGNOSIS — Z8673 Personal history of transient ischemic attack (TIA), and cerebral infarction without residual deficits: Secondary | ICD-10-CM | POA: Insufficient documentation

## 2024-06-19 DIAGNOSIS — R531 Weakness: Secondary | ICD-10-CM | POA: Insufficient documentation

## 2024-06-19 DIAGNOSIS — I639 Cerebral infarction, unspecified: Secondary | ICD-10-CM | POA: Diagnosis not present

## 2024-06-19 DIAGNOSIS — N390 Urinary tract infection, site not specified: Secondary | ICD-10-CM

## 2024-06-19 LAB — CBC
HCT: 41.9 % (ref 39.0–52.0)
Hemoglobin: 13.7 g/dL (ref 13.0–17.0)
MCH: 29.5 pg (ref 26.0–34.0)
MCHC: 32.7 g/dL (ref 30.0–36.0)
MCV: 90.1 fL (ref 80.0–100.0)
Platelets: 255 K/uL (ref 150–400)
RBC: 4.65 MIL/uL (ref 4.22–5.81)
RDW: 14.1 % (ref 11.5–15.5)
WBC: 9.5 K/uL (ref 4.0–10.5)
nRBC: 0 % (ref 0.0–0.2)

## 2024-06-19 LAB — COMPREHENSIVE METABOLIC PANEL WITH GFR
ALT: 20 U/L (ref 0–44)
AST: 16 U/L (ref 15–41)
Albumin: 3.6 g/dL (ref 3.5–5.0)
Alkaline Phosphatase: 62 U/L (ref 38–126)
Anion gap: 10 (ref 5–15)
BUN: 18 mg/dL (ref 8–23)
CO2: 27 mmol/L (ref 22–32)
Calcium: 8.8 mg/dL — ABNORMAL LOW (ref 8.9–10.3)
Chloride: 101 mmol/L (ref 98–111)
Creatinine, Ser: 0.95 mg/dL (ref 0.61–1.24)
GFR, Estimated: >60 mL/min (ref >60)
Glucose, Bld: 149 mg/dL — ABNORMAL HIGH (ref 70–99)
Potassium: 3.6 mmol/L (ref 3.5–5.1)
Sodium: 138 mmol/L (ref 135–145)
Total Bilirubin: 0.7 mg/dL (ref 0.0–1.2)
Total Protein: 6.6 g/dL (ref 6.5–8.1)

## 2024-06-19 LAB — DIFFERENTIAL
Abs Immature Granulocytes: 0.05 K/uL (ref 0.00–0.07)
Basophils Absolute: 0.1 K/uL (ref 0.0–0.1)
Basophils Relative: 1 %
Eosinophils Absolute: 0.6 K/uL — ABNORMAL HIGH (ref 0.0–0.5)
Eosinophils Relative: 6 %
Immature Granulocytes: 1 %
Lymphocytes Relative: 14 %
Lymphs Abs: 1.3 K/uL (ref 0.7–4.0)
Monocytes Absolute: 0.7 K/uL (ref 0.1–1.0)
Monocytes Relative: 7 %
Neutro Abs: 6.9 K/uL (ref 1.7–7.7)
Neutrophils Relative %: 71 %

## 2024-06-19 LAB — I-STAT CHEM 8, ED
BUN: 19 mg/dL (ref 8–23)
Calcium, Ion: 1.15 mmol/L (ref 1.15–1.40)
Chloride: 102 mmol/L (ref 98–111)
Creatinine, Ser: 1.1 mg/dL (ref 0.61–1.24)
Glucose, Bld: 142 mg/dL — ABNORMAL HIGH (ref 70–99)
HCT: 40 % (ref 39.0–52.0)
Hemoglobin: 13.6 g/dL (ref 13.0–17.0)
Potassium: 3.6 mmol/L (ref 3.5–5.1)
Sodium: 140 mmol/L (ref 135–145)
TCO2: 27 mmol/L (ref 22–32)

## 2024-06-19 LAB — URINALYSIS, W/ REFLEX TO CULTURE (INFECTION SUSPECTED)
Bacteria, UA: NONE SEEN
Bilirubin Urine: NEGATIVE
Glucose, UA: NEGATIVE mg/dL
Hgb urine dipstick: NEGATIVE
Ketones, ur: NEGATIVE mg/dL
Leukocytes,Ua: NEGATIVE
Nitrite: NEGATIVE
Protein, ur: NEGATIVE mg/dL
Specific Gravity, Urine: 1.006 (ref 1.005–1.030)
pH: 6 (ref 5.0–8.0)

## 2024-06-19 LAB — ETHANOL: Alcohol, Ethyl (B): <15 mg/dL (ref <15)

## 2024-06-19 LAB — APTT: aPTT: 28 s (ref 24–36)

## 2024-06-19 LAB — PROTIME-INR
INR: 1 (ref 0.8–1.2)
Prothrombin Time: 13.7 s (ref 11.4–15.2)

## 2024-06-19 LAB — CBG MONITORING, ED: Glucose-Capillary: 130 mg/dL — ABNORMAL HIGH (ref 70–99)

## 2024-06-19 MED ORDER — ALBUTEROL SULFATE HFA 108 (90 BASE) MCG/ACT IN AERS: 2.0000 | INHALATION_SPRAY | Freq: Four times a day (QID) | RESPIRATORY_TRACT | Status: DC | PRN

## 2024-06-19 MED ORDER — TORSEMIDE 20 MG PO TABS: 20.0000 mg | ORAL_TABLET | Freq: Every day | ORAL | Status: DC

## 2024-06-19 MED ORDER — PREGABALIN 25 MG PO CAPS
150.0000 mg | ORAL_CAPSULE | Freq: Three times a day (TID) | ORAL | Status: DC
  Administered 2024-06-19: 150 mg via ORAL
  Filled 2024-06-19: qty 2

## 2024-06-19 MED ORDER — COLCHICINE 0.6 MG PO TABS: 0.6000 mg | ORAL_TABLET | Freq: Every day | ORAL | Status: DC

## 2024-06-19 MED ORDER — DEXAMETHASONE SOD PHOSPHATE PF 10 MG/ML IJ SOLN
10.0000 mg | Freq: Once | INTRAMUSCULAR | Status: AC
  Administered 2024-06-19: 10 mg via INTRAVENOUS

## 2024-06-19 MED ORDER — VENLAFAXINE HCL ER 150 MG PO CP24: 150.0000 mg | ORAL_CAPSULE | Freq: Every day | ORAL | Status: DC

## 2024-06-19 MED ORDER — LACOSAMIDE 50 MG PO TABS: 150.0000 mg | ORAL_TABLET | Freq: Two times a day (BID) | ORAL | Status: DC

## 2024-06-19 MED ORDER — ARIPIPRAZOLE 5 MG PO TABS: 5.0000 mg | ORAL_TABLET | Freq: Every day | ORAL | Status: DC

## 2024-06-19 MED ORDER — SODIUM CHLORIDE 0.9% FLUSH
3.0000 mL | Freq: Once | INTRAVENOUS | Status: AC
  Administered 2024-06-19: 3 mL via INTRAVENOUS

## 2024-06-19 MED ORDER — ROPINIROLE HCL 0.25 MG PO TABS
0.2500 mg | ORAL_TABLET | Freq: Two times a day (BID) | ORAL | Status: DC
  Filled 2024-06-19: qty 1

## 2024-06-19 MED ORDER — FLUTICASONE PROPIONATE 50 MCG/ACT NA SUSP
2.0000 | Freq: Every day | NASAL | Status: DC
  Filled 2024-06-19: qty 16

## 2024-06-19 MED ORDER — INSULIN ASPART 100 UNIT/ML IJ SOLN: 0.0000 [IU] | Freq: Three times a day (TID) | INTRAMUSCULAR | Status: DC

## 2024-06-19 MED ORDER — KETOROLAC TROMETHAMINE 15 MG/ML IJ SOLN
15.0000 mg | Freq: Once | INTRAMUSCULAR | Status: AC
  Administered 2024-06-19: 15 mg via INTRAVENOUS
  Filled 2024-06-19: qty 1

## 2024-06-19 MED ORDER — MIDODRINE HCL 5 MG PO TABS: 7.5000 mg | ORAL_TABLET | Freq: Three times a day (TID) | ORAL | Status: DC

## 2024-06-19 MED ORDER — METOCLOPRAMIDE HCL 5 MG/ML IJ SOLN
10.0000 mg | Freq: Once | INTRAMUSCULAR | Status: AC
  Administered 2024-06-19: 10 mg via INTRAVENOUS
  Filled 2024-06-19: qty 2

## 2024-06-19 MED ORDER — METHOCARBAMOL 500 MG PO TABS
500.0000 mg | ORAL_TABLET | Freq: Three times a day (TID) | ORAL | Status: DC | PRN
  Administered 2024-06-19: 500 mg via ORAL
  Filled 2024-06-19: qty 1

## 2024-06-19 MED ORDER — PRAZOSIN HCL 1 MG PO CAPS
1.0000 mg | ORAL_CAPSULE | Freq: Every day | ORAL | Status: DC
  Filled 2024-06-19: qty 1

## 2024-06-19 MED ORDER — APIXABAN 5 MG PO TABS: 5.0000 mg | ORAL_TABLET | Freq: Two times a day (BID) | ORAL | Status: DC

## 2024-06-19 MED ORDER — LACTATED RINGERS IV BOLUS
500.0000 mL | Freq: Once | INTRAVENOUS | Status: AC
  Administered 2024-06-19: 500 mL via INTRAVENOUS

## 2024-06-19 MED ORDER — ATORVASTATIN CALCIUM 80 MG PO TABS: 80.0000 mg | ORAL_TABLET | Freq: Every day | ORAL | Status: DC

## 2024-06-19 MED ORDER — UMECLIDINIUM BROMIDE 62.5 MCG/ACT IN AEPB
1.0000 | INHALATION_SPRAY | Freq: Every day | RESPIRATORY_TRACT | Status: DC
  Filled 2024-06-19: qty 7

## 2024-06-19 MED ORDER — TRAZODONE HCL 50 MG PO TABS: 150.0000 mg | ORAL_TABLET | Freq: Every day | ORAL | Status: DC

## 2024-06-19 MED ORDER — IOHEXOL 350 MG/ML SOLN
100.0000 mL | Freq: Once | INTRAVENOUS | Status: AC | PRN
  Administered 2024-06-19: 100 mL via INTRAVENOUS

## 2024-06-19 MED ORDER — FERROUS SULFATE 325 (65 FE) MG PO TABS: 325.0000 mg | ORAL_TABLET | Freq: Every day | ORAL | Status: DC

## 2024-06-19 MED ORDER — ACETAMINOPHEN 500 MG PO TABS
1000.0000 mg | ORAL_TABLET | Freq: Once | ORAL | Status: AC
  Administered 2024-06-19: 1000 mg via ORAL
  Filled 2024-06-19: qty 2

## 2024-06-19 MED ORDER — LEVETIRACETAM 500 MG PO TABS: 500.0000 mg | ORAL_TABLET | Freq: Two times a day (BID) | ORAL | Status: DC

## 2024-06-19 MED ORDER — SPIRONOLACTONE 12.5 MG HALF TABLET: 12.5000 mg | ORAL_TABLET | Freq: Every day | ORAL | Status: DC

## 2024-06-19 MED ORDER — VITAMIN B-12 1000 MCG PO TABS: 1000.0000 ug | ORAL_TABLET | Freq: Every day | ORAL | Status: DC

## 2024-06-19 MED ORDER — ALLOPURINOL 100 MG PO TABS: 100.0000 mg | ORAL_TABLET | Freq: Every day | ORAL | Status: DC

## 2024-06-19 NOTE — ED Notes (Signed)
 Patient had an accident in the bed and then stated he need to pee I was able to obtain urine sample via urinal and change patients linens.

## 2024-06-19 NOTE — ED Notes (Signed)
 Pt soiled his linens. RN changed chuck, blanket, and applied brief.  Pt has a glucose monitor that was placed in black pouch in pt belongings bag.

## 2024-06-19 NOTE — Consult Note (Addendum)
 NEUROLOGY CONSULT NOTE   Date of service: June 19, 2024 Patient Name: Lawrence [name] MRN:  [id number] DOB:  10/05/1955 Chief Complaint: CODE STROKE Requesting Provider: Rogelia Jerilynn RAMAN, MD  History of Present Illness  Lawrence [name] is a 68 y.o. male with hx of prior CVA with residual left-sdie weakness, seizure disorder vs PNES (on Keppra ), Afib (Eliquis ), OSA, Morbid Obesity, PTSD, CdHFrEF, DM2 who was BIB EMS as an activated CODE STROKE from his SNF due to acute onset of decreased responsiveness, full body weakness, slurred speech.  On exam, patient is lethargic and is able to tell us  his first name and his wife's first name, he does not follow commands, some gaze palsy but does intermittently track examiner, immediate drift of all extremities, reduced sensation presumed as he does answer when asked about sensation but does not withdraw to pain in any extremity. CTH negative.  Patient does present with an ammonia-like smell, with evidence of incontinence. Chart review shows history of multiple UTIs We spoke with his wife, Lawrence [name], over the phone. She confirmed that patient is a DNR/DNI but is NOT a comfort measures only patient (there is a form uploaded to his chart from a previous visit that notes this mistakenly).   LKW: 0930 Modified rankin score: 3-Moderate disability-requires help but walks WITHOUT assistance IV Thrombolysis: No not a candidate due to Eliquis  EVT: No, no LVO    ROS   Unable to ascertain due to AMS. Chart review and discussion with wife completed for review.   Past History   Past Medical History:  Diagnosis Date   Acquired dilation of ascending aorta and aortic root    40mm by echo 01/2021   Adenomatous colon polyp 2007   Anemia    Anxiety    Asthma    BPH without obstruction/lower urinary tract symptoms 02/22/2017   Chronic diastolic (congestive) heart failure (HCC)    Chronic venous stasis 03/07/2019   COPD (chronic obstructive pulmonary disease)  (HCC)    Coronary artery calcification seen on CAT scan    Depression    Diabetic neuropathy (HCC) 09/11/2019   History of colon polyps 08/24/2018   Hypertension    Morbid obesity (HCC)    OSA (obstructive sleep apnea)    Pain due to onychomycosis of toenails of both feet 09/11/2019   Peripheral neuropathy 02/22/2017   Primary osteoarthritis, left shoulder 03/05/2017   PTSD (post-traumatic stress disorder)    Pure hypercholesterolemia    QT prolongation 03/07/2019   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 Wendel and Dr. Lucas   Seizures [facility])    Severe aortic stenosis    Sinus tachycardia 03/07/2019   Sleep apnea    CPAP   Type 2 diabetes mellitus with vascular disease (HCC) 09/11/2019    Past Surgical History:  Procedure Laterality Date   ENDOVENOUS ABLATION SAPHENOUS VEIN W/ LASER Right 08/20/2020   endovenous laser ablation right greater saphenous vein by Medford Blade MD    ENDOVENOUS ABLATION SAPHENOUS VEIN W/ LASER Left 11/16/2022   endovenous laser ablation left greater saphenous vein by Medford Blade MD   INTRAOPERATIVE TRANSTHORACIC ECHOCARDIOGRAM N/A 02/07/2023   Procedure: INTRAOPERATIVE TRANSTHORACIC ECHOCARDIOGRAM;  Surgeon: Thukkani, Arun K, MD;  Location: [facility] INVASIVE CV LAB;  Service: Open Heart Surgery;  Laterality: N/A;   JOINT REPLACEMENT     left knee replacement x 2   KNEE ARTHROSCOPY Bilateral    LEFT HEART CATH AND CORONARY  ANGIOGRAPHY N/A 01/17/2018   Procedure: LEFT HEART CATH AND CORONARY ANGIOGRAPHY;  Surgeon: Anner Alm ORN, MD;  Location: [facility] INVASIVE CV LAB;  Service: Cardiovascular;  Laterality: N/A;   PRESSURE SENSOR/CARDIOMEMS N/A 08/26/2021   Procedure: PRESSURE SENSOR/CARDIOMEMS;  Surgeon: Rolan Ezra RAMAN, MD;  Location: [facility] INVASIVE CV LAB;  Service: Cardiovascular;  Laterality: N/A;   RIGHT HEART CATH N/A 08/26/2021   Procedure: RIGHT HEART CATH;  Surgeon: Rolan Ezra RAMAN, MD;   Location: [facility] INVASIVE CV LAB;  Service: Cardiovascular;  Laterality: N/A;   RIGHT HEART CATH AND CORONARY ANGIOGRAPHY N/A 01/26/2023   Procedure: RIGHT HEART CATH AND CORONARY ANGIOGRAPHY;  Surgeon: Wendel Lurena POUR, MD;  Location: [facility] INVASIVE CV LAB;  Service: Cardiovascular;  Laterality: N/A;   TEE WITHOUT CARDIOVERSION N/A 08/26/2021   Procedure: TRANSESOPHAGEAL ECHOCARDIOGRAM (TEE);  Surgeon: Rolan Ezra RAMAN, MD;  Location: [facility] ENDOSCOPY;  Service: Cardiovascular;  Laterality: N/A;   TOOTH EXTRACTION N/A 02/03/2023   Procedure: DENTAL RESTORATION/EXTRACTIONS;  Surgeon: Sheryle Hamilton, DMD;  Location: [facility] OR;  Service: Oral Surgery;  Laterality: N/A;   TRANSCATHETER AORTIC VALVE REPLACEMENT, TRANSFEMORAL Right 02/07/2023   Procedure: Transcatheter Aortic Valve Replacement, Transfemoral;  Surgeon: Wendel Lurena POUR, MD;  Location: [facility] INVASIVE CV LAB;  Service: Open Heart Surgery;  Laterality: Right;   TRANSESOPHAGEAL ECHOCARDIOGRAM (CATH LAB) N/A 12/07/2023   Procedure: TRANSESOPHAGEAL ECHOCARDIOGRAM;  Surgeon: Santo Stanly LABOR, MD;  Location: [facility] INVASIVE CV LAB;  Service: Cardiovascular;  Laterality: N/A;   UMBILICAL HERNIA REPAIR      Family History: Family History  Problem Relation Age of Onset   CAD Maternal Grandfather    Diabetes Other    Diabetes Mellitus II Neg Hx    Colon cancer Neg Hx    Esophageal cancer Neg Hx    Inflammatory bowel disease Neg Hx    Liver disease Neg Hx    Pancreatic cancer Neg Hx    Rectal cancer Neg Hx    Stomach cancer Neg Hx    Sleep apnea Neg Hx     Social History  reports that he quit smoking about 8 years ago. His smoking use included cigarettes. He started smoking about 52 years ago. He has a 44 pack-year smoking history. He has been exposed to tobacco smoke. He has never used smokeless tobacco. He reports current alcohol  use. He reports that he does not use drugs.  Allergies  Allergen Reactions   Niacin And Related Other (See Comments)     Redness, flushing   Ppd [Tuberculin Purified Protein Derivative]     Unknown rxn (MAR)   Tubersol [Tuberculin, Ppd] Other (See Comments)    Unknown rxn (MAR)   Firvanq  [Vancomycin ] Other (See Comments)    Red man syndrome   Vibramycin  [Doxycycline ] Rash    Medications   Current Facility-Administered Medications:    lactated ringers  bolus 500 mL, 500 mL, Intravenous, Once, Rogelia Jerilynn RAMAN, MD   metoCLOPramide (REGLAN) injection 10 mg, 10 mg, Intravenous, Once, Rogelia Jerilynn RAMAN, MD   sodium chloride  flush (NS) 0.9 % injection 3 mL, 3 mL, Intravenous, Once, Rogelia Jerilynn RAMAN, MD  Current Outpatient Medications:    acetaminophen  (TYLENOL ) 650 MG CR tablet, Take 650 mg by mouth every 8 (eight) hours as needed for pain., Disp: , Rfl:    albuterol  (VENTOLIN  HFA) 108 (90 Base) MCG/ACT inhaler, Inhale 2 puffs into the lungs every 6 (six) hours as needed for wheezing or shortness of breath., Disp: 1 each, Rfl: 6   allopurinol  (ZYLOPRIM ) 100  MG tablet, Take 1 tablet (100 mg total) by mouth daily., Disp: , Rfl:    apixaban  (ELIQUIS ) 5 MG TABS tablet, Take 1 tablet (5 mg total) by mouth 2 (two) times daily., Disp: 180 tablet, Rfl: 3   ARIPiprazole  (ABILIFY ) 5 MG tablet, TAKE 1 TABLET BY MOUTH EVERYDAY AT BEDTIME, Disp: 60 tablet, Rfl: 2   atorvastatin  (LIPITOR ) 80 MG tablet, Take 1 tablet (80 mg total) by mouth at bedtime., Disp: 30 tablet, Rfl: 3   bisacodyl  (DULCOLAX) 5 MG EC tablet, Take 10 mg by mouth daily as needed for moderate constipation., Disp: , Rfl:    colchicine  0.6 MG tablet, Take 1 tablet (0.6 mg total) by mouth daily., Disp: 30 tablet, Rfl: 0   collagenase (SANTYL) 250 UNIT/GM ointment, Apply 1 Application topically daily. Apply to right posterior thigh topically, Disp: , Rfl:    cyanocobalamin  (VITAMIN B12) 1000 MCG tablet, Take 1,000 mcg by mouth daily., Disp: , Rfl:    fenofibrate  (TRICOR ) 145 MG tablet, Take 1 tablet (145 mg total) by mouth daily., Disp: 90 tablet, Rfl: 3    ferrous sulfate  325 (65 FE) MG tablet, Take 325 mg by mouth daily., Disp: , Rfl:    fluticasone  (FLONASE ) 50 MCG/ACT nasal spray, Place 2 sprays into both nostrils daily. , Disp: , Rfl:    guaiFENesin -dextromethorphan (ROBITUSSIN DM) 100-10 MG/5ML syrup, Take 5 mLs by mouth every 4 (four) hours as needed for cough., Disp: , Rfl:    HM LIDOCAINE  PATCH EX, Apply 1 patch topically in the morning. Apply to right foot (Patient not taking: Reported on 05/02/2024), Disp: , Rfl:    HUMALOG KWIKPEN 100 UNIT/ML KwikPen, Inject 2-15 Units into the skin. 121-150 4 units 151-200 6 units  201-250 8 units  251-300 11 units  301-350 14 units  351-400 18 units, Disp: , Rfl:    HYDROcodone -acetaminophen  (NORCO/VICODIN) 5-325 MG tablet, Take 1 tablet by mouth every 6 (six) hours as needed for severe pain (pain score 7-10)., Disp: 10 tablet, Rfl: 0   INCRUSE ELLIPTA  62.5 MCG/ACT AEPB, Inhale 1 puff into the lungs daily., Disp: , Rfl:    Lacosamide  150 MG TABS, Take 1 tablet (150 mg total) by mouth 2 (two) times daily., Disp: 10 tablet, Rfl: 0   levETIRAcetam  (KEPPRA ) 500 MG tablet, Take 1 tablet (500 mg total) by mouth 2 (two) times daily., Disp: 180 tablet, Rfl: 3   loperamide  (IMODIUM ) 2 MG capsule, Take 2 mg by mouth daily as needed for diarrhea or loose stools., Disp: , Rfl:    methocarbamol  (ROBAXIN ) 500 MG tablet, Take 1 tablet (500 mg total) by mouth every 8 (eight) hours as needed for muscle spasms., Disp: , Rfl:    metolazone  (ZAROXOLYN ) 2.5 MG tablet, Take 2.5 mg by mouth once a week. Every Tuesday., Disp: , Rfl:    midodrine  (PROAMATINE ) 5 MG tablet, Take 1.5 tablets (7.5 mg total) by mouth 3 (three) times daily with meals., Disp: , Rfl:    omega-3 acid ethyl esters (LOVAZA ) 1 g capsule, Take 2 g by mouth 2 (two) times daily., Disp: , Rfl:    omeprazole (PRILOSEC) 20 MG capsule, Take 20 mg by mouth daily., Disp: , Rfl:    polyethylene glycol powder (GLYCOLAX /MIRALAX ) 17 GM/SCOOP powder, Mix in water as directed  and take 1 capful (17 g) by mouth daily., Disp: 238 g, Rfl: 0   Potassium Chloride  ER 20 MEQ TBCR, Take 40 mEq by mouth daily., Disp: , Rfl:    prazosin  (MINIPRESS )  1 MG capsule, Take 1 mg by mouth at bedtime., Disp: , Rfl:    pregabalin  (LYRICA ) 150 MG capsule, Take 1 capsule (150 mg total) by mouth 3 (three) times daily., Disp: 15 capsule, Rfl: 0   PROTEIN PO, Take 30 mLs by mouth in the morning and at bedtime., Disp: , Rfl:    rOPINIRole  (REQUIP ) 0.25 MG tablet, Take 1 tablet (0.25 mg total) by mouth 2 (two) times daily., Disp: , Rfl:    sennosides-docusate sodium  (SENOKOT-S) 8.6-50 MG tablet, Take 2 tablets by mouth every 12 (twelve) hours as needed for constipation., Disp: , Rfl:    spironolactone  (ALDACTONE ) 25 MG tablet, Take 12.5 mg by mouth daily., Disp: , Rfl:    tirzepatide  (MOUNJARO ) 15 MG/0.5ML Pen, INJECT 15 MG INTO THE SKIN ONCE A WEEK. (Patient taking differently: Inject 15 mg into the skin once a week. Thursday), Disp: 6 mL, Rfl: 0   torsemide  (DEMADEX ) 20 MG tablet, Take 1 tablet (20 mg total) by mouth daily., Disp: , Rfl:    traZODone  (DESYREL ) 150 MG tablet, Take 1 tablet (150 mg total) by mouth at bedtime., Disp: 30 tablet, Rfl: 0   venlafaxine  XR (EFFEXOR -XR) 150 MG 24 hr capsule, Take 150 mg by mouth daily with breakfast., Disp: , Rfl:   Vitals   Vitals:   2024-06-25 1100  Weight: (!) 145.2 kg    Body mass index is 41.1 kg/m.   Physical Exam   Constitutional: Appears well-developed, elderly, older than age.  Cardiovascular: Normal rate and regular rhythm.  Respiratory: Effort normal, non-labored shallow breathing.   Neurologic Examination    Neuro: Mental Status: Patient is slightly drowsy but able to answer questions. Cranial Nerves: II: He counts exactly double the number of fingers in each visual field. Pupils are equal, round, and reactive to light.   III,IV, VI: EOMI without ptosis or diploplia.  There is no disconjugate gaze. V: Facial sensation is  symmetric to temperature VII: Facial movement is symmetric.  VIII: hearing is intact to voice X: Uvula elevates symmetrically XII: tongue is midline without atrophy or fasciculations.  Motor: He gives symmetrically poor effort in all four extremities Sensory: He response to noxious stimulation bilaterally   Cerebellar: Does not perform       Labs/Imaging/Neurodiagnostic studies   CBC:  Recent Labs  Lab Jun 25, 2024 1134  HGB 13.6  HCT 40.0   Basic Metabolic Panel:  Lab Results  Component Value Date   NA 140 06-25-2024   K 3.6 2024-06-25   CO2 24 05/08/2024   GLUCOSE 142 (H) 25-Jun-2024   BUN 19 25-Jun-2024   CREATININE 1.10 06-25-24   CALCIUM  8.9 05/08/2024   GFRNONAA >60 05/08/2024   GFRAA 77 05/20/2020   Lipid Panel:  Lab Results  Component Value Date   LDLCALC 107 (H) 12/17/2023   HgbA1c:  Lab Results  Component Value Date   HGBA1C 7.0 (H) 12/18/2023   Urine Drug Screen:     Component Value Date/Time   LABOPIA POSITIVE (A) 05/02/2024 0505   COCAINSCRNUR NONE DETECTED 05/02/2024 0505   LABBENZ NONE DETECTED 05/02/2024 0505   AMPHETMU NONE DETECTED 05/02/2024 0505   THCU NONE DETECTED 05/02/2024 0505   LABBARB NONE DETECTED 05/02/2024 0505    Alcohol  Level     Component Value Date/Time   ETH <15 05/02/2024 0318   INR  Lab Results  Component Value Date   INR 1.1 05/02/2024   APTT  Lab Results  Component Value Date   APTT 29 05/02/2024  AED levels:  Lab Results  Component Value Date   LEVETIRACETA 13.6 12/28/2023    CT Head without contrast(Personally reviewed): No acute intracranial abnormality. Mild cerebral white matter disease.  CT angio Head and Neck with contrast(Personally reviewed): No LVO  Neurodiagnostics rEEG:  pending  ASSESSMENT  JAROD BOZZO is a 68 y.o. male with hx of prior CVA with residual left-sdie weakness, seizure disorder vs PNES (on Keppra ), Afib (Eliquis ), OSA, Morbid Obesity, PTSD, CHFrEF, DM2 who  was BIB EMS as an activated CODE STROKE from his SNF due to acute onset of decreased responsiveness, full body weakness, slurred speech.   Patient has a history of seizures vs PNES. With his decreased responsiveness and slight gaze, recommend getting a STAT EEG to evaluate this etiology further. Patient also has history of multiple UTIs and presents with an ammonia-like smell and evidence of incontinence.   Differentials include: Stroke versus Stroke-like symptoms, Seizures versus PNES, Toxic-metabolic encephalopathy secondary to UTI, Conversion Disorder, Complicated Migraine  RECOMMENDATIONS   - STAT EEG  Continue home Keppra , but no loading dose at this time  - Q2H neuro checks - ok to continue Eliquis  - unable to do MRI d/t spinal stimulator - STAT CTH with any acute neuro change - consider headache cocktail, 10mg  Reglan, LR/NS bolus discussed with EDP  ______________________________________________________________________   Signed, Rocky JAYSON Likes, NP [facility]   I have seen the patient and reviewed the above note.  He is well-known to me from previous admissions, and I continue to suspect that his episodes are most likely psychogenic in nature.  He continues on Keppra , and I do think it is reasonable to continue that at this time but I would not make any changes to his medications based on today's event.  As long as he continues to improve, I do not think we would need to revisit this as it has been worked up extensively in the past.  Neurology will continue to be available as needed.  Aisha Seals, MD [facility]   If 7pm- 7am, please page neurology on call as listed in AMION.

## 2024-06-19 NOTE — ED Notes (Signed)
 RN spoke to kitchen and was informed pt dinner tray is en route

## 2024-06-19 NOTE — Procedures (Signed)
 Patient Name: Lawrence [name]  MRN: [id number]  Epilepsy Attending: Arlin MALVA Krebs  Referring Physician/Provider: Michaela Aisha SQUIBB, MD  Date: 06/19/2024 Duration: 23 mins  Patient history: 68 year old male with altered mental status.  EEG evaluate for seizure.  Level of alertness: Awake, asleep  AEDs during EEG study: None  Technical aspects: This EEG study was done with scalp electrodes positioned according to the 10-20 International system of electrode placement. Electrical activity was reviewed with band pass filter of 1-70Hz , sensitivity of 7 uV/mm, display speed of 32mm/sec with a 60Hz  notched filter applied as appropriate. EEG data were recorded continuously and digitally stored.  Video monitoring was available and reviewed as appropriate.  Description: The posterior dominant rhythm consists of 8 Hz activity of moderate voltage (25-35 uV) seen predominantly in posterior head regions, symmetric and reactive to eye opening and eye closing. Sleep was characterized by vertex waves, sleep spindles (12 to 14 Hz), maximal frontocentral region. Hyperventilation and photic stimulation were not performed.     IMPRESSION: This study is within normal limits. No seizures or epileptiform discharges were seen throughout the recording.  A normal interictal EEG does not exclude the diagnosis of epilepsy.   Skylynne Schlechter O Sanii Kukla

## 2024-06-19 NOTE — ED Notes (Signed)
 Pt states his feet are restless and would like his medication.

## 2024-06-19 NOTE — Discharge Instructions (Signed)
 [name]  Thank you for allowing us  to take care of you today.  You came to the Emergency Department today because you had abnormal behavior behavior and difficulty moving your body beginning this morning.  Here in the emergency department we initially had concern for a possible stroke, therefore we talked with the neurologist and got imaging.  We are not seeing evidence of a new stroke.  There was some abnormal blood flow to some vessels in your brain that could be where you were moving a bit during the scan versus narrowing of the blood vessel versus early stroke, however lack of blood flow to this area would not cause the symptoms that you are having on our discussion with the neurologist, therefore we do not think that you need additional workup for that portion of your scan.  We treated you for a complex headache, and thereafter you are feeling much better.  Your blood work was reassuring and your urine does not show signs of a UTI.  Given you are feeling more like your usual self, you are safe to go home and follow-up with your outpatient neurologist and primary care doctor.  To-Do: 1. Please follow-up with your primary doctor within 1 - 2 weeks / as soon as possible.   Please return to the Emergency Department or call 911 if you experience have worsening of your symptoms, or do not get better, difficulty moving or feeling part of your body that usually able to move or feel, chest pain, shortness of breath, severe or significantly worsening pain, high fever, severe confusion, pass out or have any reason to think that you need emergency medical care.   We hope you feel better soon.   Mitzie Later, MD Department of Emergency Medicine [facility]

## 2024-06-19 NOTE — ED Notes (Signed)
 RN assisted pt with using the urinal

## 2024-06-19 NOTE — Code Documentation (Signed)
 Stroke Response Nurse Documentation Code Documentation  Lawrence [name] is a 68 y.o. male arriving to [facility]  via [facility] on 06/19/2024 with past medical hx of prior CVA, residual L sided weakness, A-Fib, and seizures. On Eliquis  (apixaban ) daily. Code stroke was activated by EMS.   Patient from [facility], where he was LKW at 0930 and now complaining of global aphasia and weakness noted by staff of [facility], amd left sided facial droop.   Stroke team at the bedside on patient arrival. Labs drawn and patient cleared for CT by Dr. Rogelia. Patient to CT with team. NIHSS 27, see documentation for details and code stroke times. Patient with decreased LOC, disoriented, not following commands, right gaze preference , bilateral hemianopia, bilateral arm weakness, bilateral leg weakness, bilateral decreased sensation, Global aphasia , and Sensory  neglect on exam. The following imaging was completed:  CT Head and CTA.  Patient is not a candidate for IV Thrombolytic due to patient being anticoagulated. Patient is not a candidate for IR.   Care Plan: q2hr NIHSS, no MRI (contraindicated). Awaiting EEG.   Process Delays Noted: O2 saturation measurement prior to transport to CT. Concerns for hypoxia related to decline in mentation.  Bedside handoff with ED RN Paden and Katelyn.    Shauni Henner M Shady Padron  Stroke Response RN

## 2024-06-19 NOTE — ED Notes (Signed)
 Attempted to contact [facility] 2 times with no response

## 2024-06-19 NOTE — ED Notes (Signed)
Pt provided diet ginger ale

## 2024-06-19 NOTE — ED Triage Notes (Signed)
 Pt was bib [facility] from [facility]. The staff checked on the patient around 9:30 and he was his usual normal self then they went back in at 1030 and he was having slurred speech, left arm numbness, facial droop on the left side, bilateral weakness, and difficulty waking up. He has a pmx of stroke that happened in [place] of this year. With that he has left sided weakness in the lower limb at baseline.   Vitals 140/68 170 CBG 72 HR GCS 6 RR 12

## 2024-06-19 NOTE — ED Provider Notes (Signed)
 [place] EMERGENCY DEPARTMENT AT [facility] Provider Note   CSN: [id number] Arrival date & time: 06/19/24  1107  An emergency department physician performed an initial assessment on this suspected stroke patient at 1112.  History Chief Complaint  Patient presents with   Code Stroke    HPI: Lawrence [name] is a 68 y.o. male with history pertinent for prior stroke with residual left-sided deficits, seizure disorder versus nonepileptic spells on Keppra , atrial fibrillation on Eliquis , OSA, elevated BMI, PTSD, CHF, T2DM, who presents complaining of neurologic deficits. Patient arrived via EMS from [facility].  History provided by patient and EMS.  No interpreter required during this encounter.  Patient is reportedly usually alert and oriented at baseline.  EMS reports that patient was last seen well at approximately 930 this morning, there after was found to be altered and globally weak, right greater than left.  EMS did not provide any interventions and route.  On arrival, patient is able to state that his name is Lawrence [name], however is not able to provide additional history.  EMS does note that patient have a UTI smell present during transport.  Patient's recorded medical, surgical, social, medication list and allergies were reviewed in the Snapshot window as part of the initial history.   Prior to Admission medications   Medication Sig Start Date End Date Taking? Authorizing Provider  acetaminophen  (TYLENOL ) 650 MG CR tablet Take 650 mg by mouth every 8 (eight) hours as needed for pain.   Yes [provider]  albuterol  (VENTOLIN  HFA) 108 (90 Base) MCG/ACT inhaler Inhale 2 puffs into the lungs every 6 (six) hours as needed for wheezing or shortness of breath. 04/17/23  Yes Hunsucker, Donnice SAUNDERS, MD  allopurinol  (ZYLOPRIM ) 100 MG tablet Take 1 tablet (100 mg total) by mouth daily. 07/25/23 07/24/24 Yes Elgergawy, Brayton RAMAN, MD  apixaban  (ELIQUIS ) 5 MG TABS tablet Take 1  tablet (5 mg total) by mouth 2 (two) times daily. 04/17/23  Yes Rolan Ezra RAMAN, MD  ARIPiprazole  (ABILIFY ) 5 MG tablet TAKE 1 TABLET BY MOUTH EVERYDAY AT BEDTIME 10/03/23  Yes Mozingo, Regina Nattalie, NP  atorvastatin  (LIPITOR ) 40 MG tablet Take 80 mg by mouth at bedtime.   Yes [provider]  azelastine (OPTIVAR) 0.05 % ophthalmic solution Place 1 drop into both eyes 2 (two) times daily.   Yes [provider]  colchicine  0.6 MG tablet Take 1 tablet (0.6 mg total) by mouth daily. 07/12/23  Yes Love, Sharlet RAMAN, PA-C  cyanocobalamin  (VITAMIN B12) 1000 MCG tablet Take 1,000 mcg by mouth daily.   Yes [provider]  fenofibrate  (TRICOR ) 145 MG tablet Take 1 tablet (145 mg total) by mouth daily. 04/17/23  Yes Rolan Ezra RAMAN, MD  ferrous sulfate  325 (65 FE) MG tablet Take 325 mg by mouth daily.   Yes [provider]  fluticasone  (FLONASE ) 50 MCG/ACT nasal spray Place 2 sprays into both nostrils daily.    Yes [provider]  HUMALOG KWIKPEN 100 UNIT/ML KwikPen Inject 2-15 Units into the skin 3 (three) times daily. 121-150 2 units  151-200 3 units  201-250 5 units  251-300 8 units  301- 350 11 units  351-400 15 units  Over 400 called MD 12/11/23  Yes [provider]  HYDROcodone -acetaminophen  (NORCO/VICODIN) 5-325 MG tablet Take 1 tablet by mouth every 6 (six) hours as needed for severe pain (pain score 7-10). 05/08/24  Yes Ghimire, Donalda HERO, MD  INCRUSE ELLIPTA  62.5 MCG/ACT AEPB Inhale 1  puff into the lungs daily. 12/11/23  Yes [provider]  Lacosamide  150 MG TABS Take 1 tablet (150 mg total) by mouth 2 (two) times daily. 10/10/23  Yes Briana Elgin LABOR, MD  levETIRAcetam  (KEPPRA ) 500 MG tablet Take 1 tablet (500 mg total) by mouth 2 (two) times daily. 05/04/23  Yes Camara, Pastor, MD  lidocaine  (ASPERCREME LIDOCAINE ) 4 % Place 1 patch onto the skin daily. Apply to right foot   Yes [provider]  methocarbamol  (ROBAXIN ) 500 MG tablet  Take 1 tablet (500 mg total) by mouth every 8 (eight) hours as needed for muscle spasms. 05/08/24  Yes Ghimire, Donalda HERO, MD  metolazone  (ZAROXOLYN ) 2.5 MG tablet Take 2.5 mg by mouth once a week. Every Tuesday. 09/20/23  Yes [provider]  midodrine  (PROAMATINE ) 5 MG tablet Take 1.5 tablets (7.5 mg total) by mouth 3 (three) times daily with meals. Patient taking differently: Take 5 mg by mouth 3 (three) times daily with meals. 05/08/24  Yes Ghimire, Donalda HERO, MD  polyethylene glycol powder (GLYCOLAX /MIRALAX ) 17 GM/SCOOP powder Mix in water as directed and take 1 capful (17 g) by mouth daily. 07/12/23  Yes Love, Sharlet RAMAN, PA-C  prazosin  (MINIPRESS ) 1 MG capsule Take 1 mg by mouth at bedtime. 08/17/23  Yes [provider]  pregabalin  (LYRICA ) 150 MG capsule Take 1 capsule (150 mg total) by mouth 3 (three) times daily. 10/10/23  Yes Briana Elgin LABOR, MD  PROTEIN PO Take 30 mLs by mouth in the morning and at bedtime.   Yes [provider]  rOPINIRole  (REQUIP ) 0.25 MG tablet Take 1 tablet (0.25 mg total) by mouth 2 (two) times daily. 07/25/23  Yes Elgergawy, Brayton RAMAN, MD  spironolactone  (ALDACTONE ) 25 MG tablet Take 12.5 mg by mouth daily. 09/01/23  Yes [provider]  tirzepatide  (MOUNJARO ) 15 MG/0.5ML Pen INJECT 15 MG INTO THE SKIN ONCE A WEEK. Patient taking differently: Inject 15 mg into the skin once a week. Thursday 06/07/23  Yes Motwani, Komal, MD  torsemide  (DEMADEX ) 20 MG tablet Take 1 tablet (20 mg total) by mouth daily. 05/08/24  Yes Ghimire, Donalda HERO, MD  traZODone  (DESYREL ) 150 MG tablet Take 1 tablet (150 mg total) by mouth at bedtime. 12/20/23  Yes Akula, Vijaya, MD  venlafaxine  XR (EFFEXOR -XR) 150 MG 24 hr capsule Take 150 mg by mouth daily.   Yes [provider]     Allergies: Niacin and related; Ppd [tuberculin purified protein derivative]; Tubersol [tuberculin, ppd]; Firvanq  [vancomycin ]; and Vibramycin  [doxycycline ]   Review of Systems   ROS  as per HPI  Physical Exam Updated Vital Signs BP (!) 166/105   Pulse 81   Temp (!) 97.5 F (36.4 C) (Temporal)   Resp 13   Ht 6' 2 (1.88 m)   Wt (!) 145.2 kg   SpO2 93%   BMI 41.10 kg/m  Physical Exam Vitals and nursing note reviewed.  Constitutional:      General: He is not in acute distress.    Appearance: He is well-developed.  HENT:     Head: Normocephalic and atraumatic.  Eyes:     Conjunctiva/sclera: Conjunctivae normal.  Cardiovascular:     Rate and Rhythm: Normal rate and regular rhythm.     Heart sounds: No murmur heard. Pulmonary:     Effort: Pulmonary effort is normal. No respiratory distress.     Breath sounds: Normal breath sounds.  Abdominal:     Palpations: Abdomen is soft.     Tenderness:  There is no abdominal tenderness.  Musculoskeletal:        General: No swelling.     Cervical back: Neck supple.  Skin:    General: Skin is warm and dry.     Capillary Refill: Capillary refill takes less than 2 seconds.  Neurological:     Mental Status: He is alert.     GCS: GCS eye subscore is 2. GCS verbal subscore is 4. GCS motor subscore is 4.     Cranial Nerves: Cranial nerve deficit (Rightward gaze deviation) present.     Motor: Weakness (Global weakness) present.  Psychiatric:        Mood and Affect: Mood normal.     ED Course/ Medical Decision Making/ A&P    Procedures .Critical Care  Performed by: Rogelia Jerilynn RAMAN, MD Authorized by: Rogelia Jerilynn RAMAN, MD   Critical care provider statement:    Critical care time (minutes):  31   Critical care was necessary to treat or prevent imminent or life-threatening deterioration of the following conditions:  CNS failure or compromise (Code stroke)   Critical care was time spent personally by me on the following activities:  Development of treatment plan with patient or surrogate, discussions with consultants, evaluation of patient's response to treatment, examination of patient, ordering and review of  laboratory studies, ordering and review of radiographic studies, ordering and performing treatments and interventions, pulse oximetry, re-evaluation of patient's condition and review of old charts    Medications Ordered in ED Medications  albuterol  (VENTOLIN  HFA) 108 (90 Base) MCG/ACT inhaler 2 puff (has no administration in time range)  allopurinol  (ZYLOPRIM ) tablet 100 mg (has no administration in time range)  apixaban  (ELIQUIS ) tablet 5 mg (has no administration in time range)  ARIPiprazole  (ABILIFY ) tablet 5 mg (has no administration in time range)  atorvastatin  (LIPITOR ) tablet 80 mg (has no administration in time range)  colchicine  tablet 0.6 mg (has no administration in time range)  cyanocobalamin  (VITAMIN B12) tablet 1,000 mcg (has no administration in time range)  ferrous sulfate  tablet 325 mg (has no administration in time range)  fluticasone  (FLONASE ) 50 MCG/ACT nasal spray 2 spray (has no administration in time range)  umeclidinium bromide  (INCRUSE ELLIPTA ) 62.5 MCG/ACT 1 puff (has no administration in time range)  Lacosamide  TABS 150 mg (has no administration in time range)  levETIRAcetam  (KEPPRA ) tablet 500 mg (has no administration in time range)  methocarbamol  (ROBAXIN ) tablet 500 mg (has no administration in time range)  midodrine  (PROAMATINE ) tablet 7.5 mg (has no administration in time range)  prazosin  (MINIPRESS ) capsule 1 mg (has no administration in time range)  pregabalin  (LYRICA ) capsule 150 mg (has no administration in time range)  rOPINIRole  (REQUIP ) tablet 0.25 mg (has no administration in time range)  spironolactone  (ALDACTONE ) tablet 12.5 mg (has no administration in time range)  torsemide  (DEMADEX ) tablet 20 mg (has no administration in time range)  traZODone  (DESYREL ) tablet 150 mg (has no administration in time range)  venlafaxine  XR (EFFEXOR -XR) 24 hr capsule 150 mg (has no administration in time range)  insulin  aspart (novoLOG ) injection 0-9 Units (has no  administration in time range)  sodium chloride  flush (NS) 0.9 % injection 3 mL (3 mLs Intravenous Given 06/19/24 1204)  iohexol  (OMNIPAQUE ) 350 MG/ML injection 100 mL (100 mLs Intravenous Contrast Given 06/19/24 1135)  metoCLOPramide (REGLAN) injection 10 mg (10 mg Intravenous Given 06/19/24 1202)  lactated ringers  bolus 500 mL (0 mLs Intravenous Stopped 06/19/24 1358)  ketorolac  (TORADOL ) 15 MG/ML injection 15 mg (  15 mg Intravenous Given 06/19/24 1409)  acetaminophen  (TYLENOL ) tablet 1,000 mg (1,000 mg Oral Given 06/19/24 1408)  dexamethasone  (DECADRON ) injection 10 mg (10 mg Intravenous Given 06/19/24 1408)    Medical Decision Making:   MILBURN FREENEY is a 68 y.o. male who presents for neurologic deficit as per above.  Physical exam is pertinent for global weakness, rightward gaze deviation, GCS 10 (2, 4, 4).   The differential includes but is not limited to stroke, ICH, TIA, seizure, complex migraine, functional neurologic disorder, metabolic encephalopathy, UTI.  Independent historian: EMS  External data reviewed: Labs: reviewed prior labs for baseline and Notes: Reviewed patient's prior notes from neurology  Initial Plan:  Screening labs including CBC and Metabolic panel to evaluate for infectious or metabolic etiology of disease.  Coags and ethanol in the setting of code stroke workup Urinalysis with reflex culture ordered to evaluate for UTI or relevant urologic/nephrologic pathology.  CT head and CTA to evaluate for structural/infectious intra-cranial pathology.  EKG to evaluate for cardiac pathology Objective evaluation as below reviewed   Labs: Ordered, Independent interpretation, and Details: CBC without leukocytosis, anemia, thrombocytopenia.  Chem-8 without AKI or emergent electrolyte derangement. CMP without AKI, emergent electrolyte derangement, emergent LFT abnormality.  Point-of-care glucose WNL.  Coags reassuring.  Ethanol not elevated.  UA without evidence of  UTI.  Radiology: Ordered, Independent interpretation, Details: Personally viewed CT head and CTA, do not appreciate ICH, MLS, loss of gray/white matter differentiation or LVO, and All images reviewed independently.  Agree with radiology report at this time.   EEG adult Result Date: 06/19/2024 Shelton Arlin KIDD, MD     06/19/2024  4:13 PM Patient Name: ROME ECHAVARRIA MRN: [id number] Epilepsy Attending: Arlin KIDD Shelton Referring Physician/Provider: Michaela Aisha SQUIBB, MD Date: 06/19/2024 Duration: 23 mins Patient history: 68 year old male with altered mental status.  EEG evaluate for seizure. Level of alertness: Awake, asleep AEDs during EEG study: None Technical aspects: This EEG study was done with scalp electrodes positioned according to the 10-20 International system of electrode placement. Electrical activity was reviewed with band pass filter of 1-70Hz , sensitivity of 7 uV/mm, display speed of 90mm/sec with a 60Hz  notched filter applied as appropriate. EEG data were recorded continuously and digitally stored.  Video monitoring was available and reviewed as appropriate. Description: The posterior dominant rhythm consists of 8 Hz activity of moderate voltage (25-35 uV) seen predominantly in posterior head regions, symmetric and reactive to eye opening and eye closing. Sleep was characterized by vertex waves, sleep spindles (12 to 14 Hz), maximal frontocentral region. Hyperventilation and photic stimulation were not performed.   IMPRESSION: This study is within normal limits. No seizures or epileptiform discharges were seen throughout the recording. A normal interictal EEG does not exclude the diagnosis of epilepsy. Priyanka O Yadav   CT ANGIO HEAD NECK W WO CM (CODE STROKE) Result Date: 06/19/2024 EXAM: CTA HEAD AND NECK WITHOUT AND WITH 06/19/2024 11:35:47 AM TECHNIQUE: CTA of the head and neck was performed without and with the administration of 100 mL (iohexol  (OMNIPAQUE ) 350 MG/ML injection 100  mL IOHEXOL  350 MG/ML SOLN) intravenous contrast. Multiplanar 2D and/or 3D reformatted images are provided for review. Automated exposure control, iterative reconstruction, and/or weight based adjustment of the mA/kV was utilized to reduce the radiation dose to as low as reasonably achievable. Stenosis of the internal carotid arteries measured using NASCET criteria. COMPARISON: CT head and CTA head and neck from 03/06/2024. CLINICAL HISTORY: Neuro deficit, acute, stroke suspected. FINDINGS: CTA NECK:  AORTIC ARCH AND ARCH VESSELS: Common origin of the brachiocephalic and left common carotid arteries. Atherosclerosis along the proximal left subclavian artery without stenosis. Both subclavian arteries are patent. No dissection or arterial injury. CERVICAL CAROTID ARTERIES: The right carotid artery is patent from the origin to the skull base. Atherosclerosis of the right carotid bifurcation without hemodynamically significant stenosis. Retropharyngeal course of the proximal cervical ICA. The left carotid artery is patent from the origin to the skull base. Atherosclerosis at the left carotid bifurcation without hemodynamically significant stenosis. Retropharyngeal course of the proximal left cervical ICA. No dissection or arterial injury. CERVICAL VERTEBRAL ARTERIES: The vertebral arteries are patent from the origins to the vertebrobasilar confluence. No dissection, arterial injury, or significant stenosis. LUNGS AND MEDIASTINUM: Paraseptal and centrilobular emphysema in the lung apices. SOFT TISSUES: Mucosal thickening in the ethmoid and sphenoid sinuses. Dental caries. BONES: Degenerative changes throughout the visualized spine. Similar anterolisthesis of C3 on C4 and C4 on C5. CTA HEAD: ANTERIOR CIRCULATION: The intracranial internal carotid arteries are patent bilaterally. Mild atherosclerosis of the carotid siphons without stenosis. The right M1 segment is patent. There is diminished caliber of multiple M2 and M3  branches in the posterior aspect of the right MCA territory without evidence of focal occlusion. Some of this appears that the vessels may be secondary to artifact. Recommend MRI for further evaluation of infarct in this territory. There is no lesion identified which is amenable to acute neurovascular intervention. The left M1 segment is patent. Additional left MCA branches are patent. There is moderate stenosis of a proximal M2 branch of the left MCA inferior division. The anterior cerebral arteries are patent bilaterally. Moderate stenosis of the left A2 segment. No aneurysm. POSTERIOR CIRCULATION: Moderate stenosis of the posterior P2 segment of the right PCA. Posterior communicating arteries visualized bilaterally. The superior cerebellar arteries are patent bilaterally. The basilar artery is patent. No significant stenosis of the vertebral arteries. No aneurysm. OTHER: No dural venous sinus thrombosis on this non-dedicated study. IMPRESSION: 1. No acute large vessel occlusion. 2. Diminished caliber of multiple M2 and M3 branches in the posterior right MCA territory without focal occlusion, possibly artifactual. Recommend MRI to evaluate for infarct. 3. Moderate stenosis of a proximal M2 branch of the left MCA inferior division, moderate stenosis of the left A2 segment, and moderate stenosis of the right PCA P2 segment. 4. No lesion is identified that is amenable to acute neurovascular intervention. 5. Degenerative changes throughout the visualized spine, with anterolisthesis of C3 on C4 and C4 on C5. 6. Paraseptal and centrilobular emphysema in the lung apices. Consider evaluation for a low-dose CT lung cancer screening program given emphysema is an independent risk factor for lung cancer. Electronically signed by: Donnice Mania MD 06/19/2024 12:22 PM EST RP Workstation: [id number]   CT HEAD CODE STROKE WO CONTRAST Result Date: 06/19/2024 EXAM: CT HEAD WITHOUT CONTRAST 06/19/2024 11:19:00 AM TECHNIQUE: CT of  the head was performed without the administration of intravenous contrast. Automated exposure control, iterative reconstruction, and/or weight based adjustment of the mA/kV was utilized to reduce the radiation dose to as low as reasonably achievable. COMPARISON: CT of the head dated 05/07/2024. CLINICAL HISTORY: Neuro deficit, acute, stroke suspected. FINDINGS: BRAIN AND VENTRICLES: No acute hemorrhage. No evidence of acute infarct. No hydrocephalus. No extra-axial collection. No mass effect or midline shift. There is mild cerebral white matter disease. There is calcific atheromatous disease within the carotid siphons. [place] Stroke Program Early CT Score (ASPECTS): Ganglionic (caudate, IC, lentiform nucleus, insula,  M1-M3): 7 Supraganglionic (M4-M6): 3 Total: 10 The above findings were communicated to Dr. Michaela at 11:27 AM 06/19/2024. ORBITS: No acute abnormality. SINUSES: No acute abnormality. SOFT TISSUES AND SKULL: No acute soft tissue abnormality. No skull fracture. IMPRESSION: 1. No acute intracranial abnormality. 2. Mild cerebral white matter disease. Electronically signed by: Evalene Coho MD 06/19/2024 11:28 AM EST RP Workstation: [id number]    EKG/Medicine tests: Ordered and Independent interpretation EKG Interpretation: Accelerated junctional rhythm Nonspecific intraventricular conduction delay Confirmed by Rogelia Satterfield ([id number]) on 06/19/2024 1:32:15 PM  Interventions: Reglan, LR bolus  See the EMR for full details regarding lab and imaging results.  Patient was activated as a code stroke prior to arrival, on arrival he does have gaze deviation, global weakness, and altered mental status.  Neurology presents to the bedside soon after patient arrival.  Given patient is saturating well on room air, able to state his name, do feel that patient is safe to proceed to scanner.  Further review of patient's paperwork does indicate that he has a DNR/DNI order.  Patient also has a [facility] MOST  form on record that indicates that he is comfort measures only.  Dr. Michaela with neurology is familiar with patient, and reports that this is a misunderstanding from earlier this year, and that they did want medical workup and treatment, however not resuscitation on the discussions that Dr. Michaela has had with the patient on previous hospitalizations.  We attempted to review this with patient, and he responds asked Lawrence [name].  Lawrence [name] is his wife.  Dr. Michaela called Lawrence [name], and I was present for this phone conversation, she clarifies that the patient would want medical workup including CT imaging and potential medication to treat stroke, however notes that patient would not want cardiopulmonary resuscitation or intubation.  Patient will undergo CT head and CTA, Dr. Michaela recommends EEG to evaluate for possible seizure activity, and recommends 10 mg Reglan to treat for possible complex migraine, and if patient continues to have altered mental status and etiology is not revealed by workup, consider admission to medicine for altered mental status.  On reevaluation, patient is GCS 15, is 4/5 strength in all extremities, however complains of significant headache.  Will administer Decadron , Tylenol , Toradol  for headache.  Given improvement in physical exam, I did reach out to Dr. Michaela.  Of note, CTA does note  diminished caliber of multiple M2 and M3 branches in the posterior right MCA territory without focal occlusion, possibly artifactual. Recommend MRI to evaluate for infarct.  Patient unable to undergo MRI due to spinal stimulator, I did specifically discuss this finding with Dr. Michaela who in light of patient's improving exam, and the lack of correlation of this vascular distribution to patient symptoms does not feel that patient requires further workup such as admission for repeat CT head, particularly given patient has had similar previous presentations with no underlying cause of  spells identified.  EEG also reassuring per Dr. Michaela.  On reevaluation, patient continues to be GCS 15, able to move all extremity, reports that headache has resolved.  Patient is comfortable for plan with discharge and outpatient follow-up.  I also updated patient's wife via phone, Lawrence [name], who is amenable to this plan.  Plan for patient to continue to follow-up outpatient with PCP and neurology.  Patient will await transportation back to facility, therefore diet medications ordered.  Presentation is most consistent with acute complicated illness and Presentation is most consistent with acute life/limb-threatening illness  Discussion of management or test interpretations  with external provider(s): Dr. Michaela, neurology  Risk Drugs:OTC drugs and Prescription drug management Critical Care: 31 minutes  Disposition: DISCHARGE: I believe that the patient is safe for discharge home with outpatient follow-up. Patient was informed of all pertinent physical exam, laboratory, and imaging findings.  Patient's suspected etiology of their symptom presentation was discussed with the patient and all questions were answered. We discussed following up with PCP and outpatient neurologist. I provided thorough ED return precautions. The patient feels safe and comfortable with this plan.  MDM generated using voice dictation software and may contain dictation errors.  Please contact me for any clarification or with any questions.  Clinical Impression:  1. Neurological deficit present      Discharge   Final Clinical Impression(s) / ED Diagnoses Final diagnoses:  Neurological deficit present    Rx / DC Orders ED Discharge Orders     None        Rogelia Jerilynn RAMAN, MD 06/19/24 [phone]

## 2024-06-19 NOTE — ED Notes (Signed)
 [facility]tar called

## 2024-06-19 NOTE — Progress Notes (Signed)
 STAT EEG complete - results pending. ? ?

## 2024-07-31 ENCOUNTER — Ambulatory Visit: Admitting: Neurology

## 2024-08-05 ENCOUNTER — Ambulatory Visit: Admitting: Professional Counselor

## 2024-08-06 NOTE — Progress Notes (Signed)
 Pt and counselor did not connect on telehealth platform.   Almarie Sprang, [facility]

## 2024-08-22 ENCOUNTER — Other Ambulatory Visit (INDEPENDENT_AMBULATORY_CARE_PROVIDER_SITE_OTHER)

## 2024-08-22 ENCOUNTER — Ambulatory Visit: Admitting: Sports Medicine

## 2024-08-22 ENCOUNTER — Encounter: Payer: Self-pay | Admitting: Sports Medicine

## 2024-08-22 DIAGNOSIS — G8929 Other chronic pain: Secondary | ICD-10-CM

## 2024-08-22 DIAGNOSIS — Z96652 Presence of left artificial knee joint: Secondary | ICD-10-CM | POA: Diagnosis not present

## 2024-08-22 DIAGNOSIS — R5381 Other malaise: Secondary | ICD-10-CM

## 2024-08-22 DIAGNOSIS — I69354 Hemiplegia and hemiparesis following cerebral infarction affecting left non-dominant side: Secondary | ICD-10-CM | POA: Diagnosis not present

## 2024-08-22 DIAGNOSIS — M7632 Iliotibial band syndrome, left leg: Secondary | ICD-10-CM | POA: Diagnosis not present

## 2024-08-22 DIAGNOSIS — M25562 Pain in left knee: Secondary | ICD-10-CM | POA: Diagnosis not present

## 2024-08-22 NOTE — Progress Notes (Signed)
 Patient says that he had his left knee replaced about 15 years ago, and is concerned that he may need to have it redone. He says that he began having pain over the lateral left knee about 2 weeks ago, and in the last week has had pain further up the leg in the lateral thigh. He denies having any injuries or falls. He takes Tramadol  every 6 hours or as needed, and has not gotten any relief with that.

## 2024-08-22 NOTE — Progress Notes (Signed)
 "  Lawrence [name] - 69 y.o. male MRN [id number]  Date of birth: 09/03/1955  Office Visit Note: Visit Date: 08/22/2024 PCP: [facility], [facility] Referred by: [facility], [facility] Lien  Subjective: Chief Complaint  Patient presents with   Left Knee - Pain   HPI: Lawrence [name] is a pleasant 69 y.o. male who presents today for left knee pain in setting of previous TKA x 15 years ago.  Notable PMHx with history pertinent for prior stroke with residual left-sided deficits, seizure disorder versus nonepileptic spells on Keppra , atrial fibrillation on Eliquis , OSA, elevated BMI, PTSD, CHF, T2DM.  Lab Results  Component Value Date   HGBA1C 7.0 (H) 12/18/2023   Discussed the use of AI scribe software for clinical note transcription with the patient, who gave verbal consent to proceed.  History of Present Illness Lawrence [name] is a 69 year old male with left total knee arthroplasty and recent left-sided hemiparesis who presents with two weeks of worsening left knee and lateral thigh pain.  He had a left total knee arthroplasty about fifteen years ago and did well until two weeks ago, when he developed new pain at the lateral left knee and lateral thigh. The knee pain is constant, the lateral thigh pain is intermittent, and both are persistent. Pressure on the area sometimes helps. He denies swelling, mechanical symptoms, or instability.  He is taking tramadol  without relief. He has not used topical diclofenac  because it was not prescribed and has not tried dry needling or similar therapies.  Over the past two months he has become more dependent on a wheelchair due to bilateral lower extremity weakness after a stroke one month ago. He had gait-focused post-stroke physical therapy that stopped due to insurance limits. During therapy he had episodes of collapse but no pain with use of a stand lift. He continues to attempt ambulation as tolerated.  He has chronic right knee pain  described as bone on bone but is not pursuing surgery at this time.   Pertinent ROS were reviewed with the patient and found to be negative unless otherwise specified above in HPI.   Assessment & Plan: Visit Diagnoses:  1. Chronic knee pain after total replacement of left knee joint   2. Left knee pain, unspecified chronicity   3. It band syndrome, left   4. Physical debility   5. Hemiparesis affecting left side as late effect of cerebrovascular accident (CVA) (HCC)    *Additional time was spent reviewing patient's paperwork and facesheet from [facility] as well as time spent with documentation and filling out forms to return to his healthcare team at [facility]. Assessment & Plan Left knee pain after total knee replacement Radiographs show intact, well-positioned hardware without loosening or malalignment. Pain not due to prosthesis failure. - Reviewed radiographs confirming intact hardware. - Reassured about prosthesis integrity. - Supplied x-ray images to medical SNF and physical therapy team (in folder). - May continue his tramadol  50 mg every 6 hours as needed which he receives through his facility  Left iliotibial band syndrome Lateral knee and thigh pain consistent with IT band syndrome, likely due to decreased mobility and muscle atrophy. - Prescribed diclofenac  gel for topical application three times daily. - Recommended physical therapy focusing on IT band stretching, muscle activation, and soft tissue modalities such as DN, iontophoresis, [place]). - Provided written instructions for therapy modalities at rehabilitation facility. - Supplied x-ray images for therapy team review. - May continue his tramadol  50 mg  every 6 hours as needed which he receives through his facility  Proximal leg muscle weakness with physical debility Weakness and debility likely from decreased activity, muscle atrophy, and recent CVA sequelae. - Recommended continuation and optimization of  physical therapy for muscle activation and strength. - Encouraged use of TENS unit and stationary bike as tolerated to activate muscles. - Provided written instructions for therapy team to focus on muscle activation and strengthening.  Hemiparesis affecting left side after CVA Left-sided hemiparesis post-CVA contributing to debility and wheelchair dependence. - Encouraged ongoing physical therapy and rehabilitation. - Provided written instructions for therapy team to address hemiparesis and maximize recovery. - Discussed follow-up with knee-specific surgeons if symptoms persist or worsen.   Follow-up: Return in about 6 weeks (around 10/03/2024) for F/u with Dr. Vernetta or Jerri for L-knee if not improved > 6 weeks.   Meds & Orders: No orders of the defined types were placed in this encounter.   Orders Placed This Encounter  Procedures   XR Knee Complete 4 Views Left     Procedures: No procedures performed      Clinical History: No specialty comments available.  He reports that he quit smoking about 8 years ago. His smoking use included cigarettes. He started smoking about 52 years ago. He has a 44 pack-year smoking history. He has been exposed to tobacco smoke. He has never used smokeless tobacco.  Recent Labs    10/02/23 1312 10/04/23 0337 11/28/23 0756 12/18/23 0847  HGBA1C 6.5* 6.6*  --  7.0*  LABURIC  --   --  5.5  --     Objective:   Vital Signs: There were no vitals taken for this visit.  Physical Exam  Gen: Well-appearing, in no acute distress; non-toxic CV: Well-perfused. Warm.  Resp: Breathing unlabored on room air; no wheezing. Psych: Fluid speech in conversation; appropriate affect; normal thought process  *MSK/Ortho Exam: Physical Exam MUSCULOSKELETAL: Knee range of motion normal. Pain on knee extension. No clicking or catching in knee. Knee palpation normal. No knee swelling.  *PE: Patient is wheelchair dependent, unable to transfer from wheelchair.  There  is notable proximal muscle weakness bilaterally as well as left-sided deficits likely status post previous stroke with hemiparesis.  There is tenderness along the vastus lateralis in the mid to distal IT band of the knee.  Well-healed incision from previous TKA.  Range of motion passively from 0-125 degrees.  No instability.  Imaging: XR Knee Complete 4 Views Left Result Date: 08/22/2024 Three-view x-ray of the left knee including AP, lateral and sunrise view were ordered and reviewed by myself today.  X-rays demonstrate a well-seated total knee arthroplasty without any signs of loosening, femoral and tibial stem appears well aligned.  There is postoperative soft tissue calcification noted but no acute findings otherwise.   Past Medical/Family/Surgical/Social History: Medications & Allergies reviewed per EMR, new medications updated. Patient Active Problem List   Diagnosis Date Noted   Drowsy 12/17/2023   UTI (urinary tract infection) 12/16/2023   Acute left-sided weakness 12/15/2023   COVID-19 virus infection 11/27/2023   Fever 11/27/2023   Fall from wheelchair 11/27/2023   Hyperlipidemia 11/27/2023   Acute respiratory failure with hypoxia (HCC) 10/20/2023   Influenza A with pneumonia 10/20/2023   Altered mental status 10/05/2023   MDD (major depressive disorder), recurrent severe, without psychosis (HCC) 10/05/2023   Hypoglycemia 10/03/2023   SIRS (systemic inflammatory response syndrome) (HCC) 10/03/2023   History of COPD 10/03/2023   History of seizures 10/03/2023  Multiple falls 07/21/2023   Fall 07/15/2023   Acute metabolic encephalopathy 07/14/2023   Obesity, Class III, BMI 40-49.9 (morbid obesity) (HCC) 07/14/2023   History of CVA with residual deficit 07/14/2023   Fall at home, initial encounter 07/14/2023   Gout 07/12/2023   Constipation 07/09/2023   Polyneuropathy 07/04/2023   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   Acute kidney injury superimposed on  stage 3a chronic kidney disease (HCC) 07/03/2023   Hypotension 07/03/2023   Stage 3a chronic kidney disease (HCC) 06/30/2023   Acute stroke due to occlusion of left cerebellar artery (HCC) 06/28/2023   Type 2 diabetes mellitus with diabetic polyneuropathy, with long-term current use of insulin  (HCC) 06/28/2023   CVA (cerebral vascular accident) (HCC) 06/25/2023   Atrial fibrillation, chronic (HCC) 06/25/2023   Acute CVA (cerebrovascular accident) (HCC) 06/25/2023   S/P TAVR (transcatheter aortic valve replacement) 02/07/2023   Acute respiratory failure with hypoxia and hypercapnia (HCC) 01/23/2023   Nail, injury by, initial encounter 12/09/2022   Heart failure (HCC) 10/27/2022   Gout attack 10/27/2022   CHF (congestive heart failure) (HCC) 10/26/2022   Right sided weakness 05/26/2022   Dysarthria 05/25/2022   Ischemic stroke (HCC) 02/25/2022   Hemiparesis affecting left side as late effect of cerebrovascular accident (CVA) (HCC) 02/25/2022   Durable power of attorney for healthcare exists  02/25/2022   Depression 08/07/2021   Paronychia of great toe, left 07/07/2021   Stroke-like symptoms 04/27/2021   Ambulatory dysfunction 04/27/2021   Left-sided weakness 02/09/2021   Atrial fibrillation with rapid ventricular response (HCC) 02/08/2021   AF (paroxysmal atrial fibrillation) (HCC) 01/25/2021   COPD (chronic obstructive pulmonary disease) (HCC) 01/25/2021   Aortic stenosis 01/21/2021   Acquired dilation of ascending aorta and aortic root 01/21/2021   Coronary artery disease due to lipid rich plaque    SVT (supraventricular tachycardia)    Seizure disorder (HCC)    Primary hypertension 10/17/2020   Hypokalemia 10/17/2020   Cellulitis of right leg 12/04/2019   Pain due to onychomycosis of toenails of both feet 09/11/2019   Diabetic neuropathy (HCC) 09/11/2019   DM2 (diabetes mellitus, type 2) (HCC) 09/11/2019   Sinus tachycardia 03/07/2019   Chronic venous stasis 03/07/2019    History of anemia 12/26/2018   Anemia 08/24/2018   History of colon polyps 08/24/2018   Do not resuscitate status 08/24/2018   Chronic diastolic CHF (congestive heart failure) (HCC) 08/18/2018   Coronary artery calcification seen on CAT scan    Pure hypercholesterolemia    Chest pain    Shortness of breath    Right foot pain 01/13/2018   Acute on chronic diastolic CHF (congestive heart failure) (HCC) 01/13/2018   Type 2 diabetes mellitus with obesity 01/13/2018   Lower extremity cellulitis 11/07/2017   Primary osteoarthritis, left shoulder 03/05/2017   BPH without obstruction/lower urinary tract symptoms 02/22/2017   Hematuria 02/22/2017   Peripheral neuropathy 02/22/2017   Physical deconditioning 02/22/2017   Class 3 obesity (HCC)    PTSD (post-traumatic stress disorder)    Anasarca 01/31/2017   Bilateral lower extremity edema 01/31/2017   OSA (obstructive sleep apnea)    Past Medical History:  Diagnosis Date   Acquired dilation of ascending aorta and aortic root    40mm by echo 01/2021   Adenomatous colon polyp 2007   Anemia    Anxiety    Asthma    BPH without obstruction/lower urinary tract symptoms 02/22/2017   Chronic diastolic (congestive) heart failure (HCC)    Chronic venous stasis 03/07/2019  COPD (chronic obstructive pulmonary disease) (HCC)    Coronary artery calcification seen on CAT scan    Depression    Diabetic neuropathy (HCC) 09/11/2019   History of colon polyps 08/24/2018   Hypertension    Morbid obesity (HCC)    OSA (obstructive sleep apnea)    Pain due to onychomycosis of toenails of both feet 09/11/2019   Peripheral neuropathy 02/22/2017   Primary osteoarthritis, left shoulder 03/05/2017   PTSD (post-traumatic stress disorder)    Pure hypercholesterolemia    QT prolongation 03/07/2019   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 Wendel and Dr. Lucas   Seizures [facility])    Severe aortic  stenosis    Sinus tachycardia 03/07/2019   Sleep apnea    CPAP   Type 2 diabetes mellitus with vascular disease (HCC) 09/11/2019   Family History  Problem Relation Age of Onset   CAD Maternal Grandfather    Diabetes Other    Diabetes Mellitus II Neg Hx    Colon cancer Neg Hx    Esophageal cancer Neg Hx    Inflammatory bowel disease Neg Hx    Liver disease Neg Hx    Pancreatic cancer Neg Hx    Rectal cancer Neg Hx    Stomach cancer Neg Hx    Sleep apnea Neg Hx    Past Surgical History:  Procedure Laterality Date   ENDOVENOUS ABLATION SAPHENOUS VEIN W/ LASER Right 08/20/2020   endovenous laser ablation right greater saphenous vein by Medford Blade MD    ENDOVENOUS ABLATION SAPHENOUS VEIN W/ LASER Left 11/16/2022   endovenous laser ablation left greater saphenous vein by Medford Blade MD   INTRAOPERATIVE TRANSTHORACIC ECHOCARDIOGRAM N/A 02/07/2023   Procedure: INTRAOPERATIVE TRANSTHORACIC ECHOCARDIOGRAM;  Surgeon: Thukkani, Arun K, MD;  Location: [facility] INVASIVE CV LAB;  Service: Open Heart Surgery;  Laterality: N/A;   JOINT REPLACEMENT     left knee replacement x 2   KNEE ARTHROSCOPY Bilateral    LEFT HEART CATH AND CORONARY ANGIOGRAPHY N/A 01/17/2018   Procedure: LEFT HEART CATH AND CORONARY ANGIOGRAPHY;  Surgeon: Anner Alm ORN, MD;  Location: [facility] INVASIVE CV LAB;  Service: Cardiovascular;  Laterality: N/A;   PRESSURE SENSOR/CARDIOMEMS N/A 08/26/2021   Procedure: PRESSURE SENSOR/CARDIOMEMS;  Surgeon: Rolan Ezra RAMAN, MD;  Location: [facility] INVASIVE CV LAB;  Service: Cardiovascular;  Laterality: N/A;   RIGHT HEART CATH N/A 08/26/2021   Procedure: RIGHT HEART CATH;  Surgeon: Rolan Ezra RAMAN, MD;  Location: [facility] INVASIVE CV LAB;  Service: Cardiovascular;  Laterality: N/A;   RIGHT HEART CATH AND CORONARY ANGIOGRAPHY N/A 01/26/2023   Procedure: RIGHT HEART CATH AND CORONARY ANGIOGRAPHY;  Surgeon: Wendel Lurena POUR, MD;  Location: [facility] INVASIVE CV LAB;  Service: Cardiovascular;  Laterality: N/A;    TEE WITHOUT CARDIOVERSION N/A 08/26/2021   Procedure: TRANSESOPHAGEAL ECHOCARDIOGRAM (TEE);  Surgeon: Rolan Ezra RAMAN, MD;  Location: [facility] ENDOSCOPY;  Service: Cardiovascular;  Laterality: N/A;   TOOTH EXTRACTION N/A 02/03/2023   Procedure: DENTAL RESTORATION/EXTRACTIONS;  Surgeon: Sheryle Hamilton, DMD;  Location: [facility] OR;  Service: Oral Surgery;  Laterality: N/A;   TRANSCATHETER AORTIC VALVE REPLACEMENT, TRANSFEMORAL Right 02/07/2023   Procedure: Transcatheter Aortic Valve Replacement, Transfemoral;  Surgeon: Wendel Lurena POUR, MD;  Location: [facility] INVASIVE CV LAB;  Service: Open Heart Surgery;  Laterality: Right;   TRANSESOPHAGEAL ECHOCARDIOGRAM (CATH LAB) N/A 12/07/2023   Procedure: TRANSESOPHAGEAL ECHOCARDIOGRAM;  Surgeon: Santo Stanly LABOR, MD;  Location: [facility] INVASIVE CV LAB;  Service:  Cardiovascular;  Laterality: N/A;   UMBILICAL HERNIA REPAIR     Social History   Occupational History   Occupation: retired    Comment: Building Control Surveyor  Tobacco Use   Smoking status: Former    Current packs/day: 0.00    Average packs/day: 1 pack/day for 44.0 years (44.0 ttl pk-yrs)    Types: Cigarettes    Start date: 03/07/1972    Quit date: 03/07/2016    Years since quitting: 8.4    Passive exposure: Past   Smokeless tobacco: Never  Vaping Use   Vaping status: Never Used  Substance and Sexual Activity   Alcohol  use: Yes    Comment: rare   Drug use: No   Sexual activity: Not Currently   "

## 2024-09-06 ENCOUNTER — Encounter (HOSPITAL_COMMUNITY): Payer: Self-pay

## 2024-09-06 ENCOUNTER — Emergency Department (HOSPITAL_COMMUNITY)
Admission: EM | Admit: 2024-09-06 | Discharge: 2024-09-06 | Disposition: A | Discharge status: Skilled Nursing Facility | Attending: Emergency Medicine | Admitting: Emergency Medicine

## 2024-09-06 ENCOUNTER — Other Ambulatory Visit: Payer: Self-pay

## 2024-09-06 ENCOUNTER — Emergency Department (HOSPITAL_COMMUNITY)

## 2024-09-06 DIAGNOSIS — I110 Hypertensive heart disease with heart failure: Secondary | ICD-10-CM | POA: Insufficient documentation

## 2024-09-06 DIAGNOSIS — Z96652 Presence of left artificial knee joint: Secondary | ICD-10-CM | POA: Insufficient documentation

## 2024-09-06 DIAGNOSIS — E1159 Type 2 diabetes mellitus with other circulatory complications: Secondary | ICD-10-CM | POA: Insufficient documentation

## 2024-09-06 DIAGNOSIS — Z955 Presence of coronary angioplasty implant and graft: Secondary | ICD-10-CM | POA: Insufficient documentation

## 2024-09-06 DIAGNOSIS — J4489 Other specified chronic obstructive pulmonary disease: Secondary | ICD-10-CM | POA: Insufficient documentation

## 2024-09-06 DIAGNOSIS — I5032 Chronic diastolic (congestive) heart failure: Secondary | ICD-10-CM | POA: Insufficient documentation

## 2024-09-06 DIAGNOSIS — E1142 Type 2 diabetes mellitus with diabetic polyneuropathy: Secondary | ICD-10-CM | POA: Insufficient documentation

## 2024-09-06 DIAGNOSIS — Z87891 Personal history of nicotine dependence: Secondary | ICD-10-CM | POA: Insufficient documentation

## 2024-09-06 DIAGNOSIS — M79671 Pain in right foot: Secondary | ICD-10-CM | POA: Insufficient documentation

## 2024-09-06 MED ORDER — KETOROLAC TROMETHAMINE 30 MG/ML IJ SOLN
30.0000 mg | Freq: Once | INTRAMUSCULAR | Status: AC
  Administered 2024-09-06: 30 mg via INTRAMUSCULAR
  Filled 2024-09-06: qty 1

## 2024-09-06 NOTE — Discharge Instructions (Signed)
 You were evaluated in the Emergency Department and after careful evaluation, we did not find any emergent condition requiring admission or further testing in the hospital.  Your exam/testing today was overall reassuring.  Symptoms could be due to arthritis.  Recommend follow-up with your primary care doctor.  Please return to the Emergency Department if you experience any worsening of your condition.  Thank you for allowing us  to be a part of your care.

## 2024-09-06 NOTE — ED Notes (Signed)
 [facility] called and given report on pt. Made them aware that transport would be on their way.

## 2024-09-06 NOTE — ED Triage Notes (Signed)
 Pt BIB [facility] from [facility] c/o right foot pain, weakness, and numbness (on the top of his foot). Pt states that he has arthritis in his foot and usually has pain, but today's pain is much worse 10/10.  Per EMS pt is prescribed oxycodone  daily for arthritis.  A&Ox4.   EMS vitals  140/82  92 HR  97% sp02 room air

## 2024-09-13 ENCOUNTER — Emergency Department (HOSPITAL_COMMUNITY)
Admission: EM | Admit: 2024-09-13 | Discharge: 2024-09-13 | Disposition: A | Discharge status: Skilled Nursing Facility | Source: Home / Self Care | Attending: Emergency Medicine | Admitting: Emergency Medicine

## 2024-09-13 ENCOUNTER — Emergency Department (HOSPITAL_COMMUNITY)

## 2024-09-13 DIAGNOSIS — R4182 Altered mental status, unspecified: Secondary | ICD-10-CM

## 2024-09-13 LAB — CBC
HCT: 44.4 % (ref 39.0–52.0)
Hemoglobin: 15 g/dL (ref 13.0–17.0)
MCH: 29.2 pg (ref 26.0–34.0)
MCHC: 33.8 g/dL (ref 30.0–36.0)
MCV: 86.5 fL (ref 80.0–100.0)
Platelets: 277 10*3/uL (ref 150–400)
RBC: 5.13 MIL/uL (ref 4.22–5.81)
RDW: 13.6 % (ref 11.5–15.5)
WBC: 11.1 10*3/uL — ABNORMAL HIGH (ref 4.0–10.5)
nRBC: 0 % (ref 0.0–0.2)

## 2024-09-13 LAB — BASIC METABOLIC PANEL WITH GFR
Anion gap: 13 (ref 5–15)
BUN: 16 mg/dL (ref 8–23)
CO2: 24 mmol/L (ref 22–32)
Calcium: 9.6 mg/dL (ref 8.9–10.3)
Chloride: 97 mmol/L — ABNORMAL LOW (ref 98–111)
Creatinine, Ser: 1.05 mg/dL (ref 0.61–1.24)
GFR, Estimated: >60 mL/min
Glucose, Bld: 126 mg/dL — ABNORMAL HIGH (ref 70–99)
Potassium: 3.7 mmol/L (ref 3.5–5.1)
Sodium: 134 mmol/L — ABNORMAL LOW (ref 135–145)

## 2024-09-13 LAB — URINE DRUG SCREEN
Amphetamines: NEGATIVE
Barbiturates: NEGATIVE
Benzodiazepines: NEGATIVE
Cocaine: NEGATIVE
Fentanyl: NEGATIVE
Methadone Scn, Ur: NEGATIVE
Opiates: NEGATIVE
Tetrahydrocannabinol: NEGATIVE

## 2024-09-13 LAB — I-STAT CHEM 8, ED
BUN: 16 mg/dL (ref 8–23)
Calcium, Ion: 1.17 mmol/L (ref 1.15–1.40)
Chloride: 98 mmol/L (ref 98–111)
Creatinine, Ser: 1.1 mg/dL (ref 0.61–1.24)
Glucose, Bld: 127 mg/dL — ABNORMAL HIGH (ref 70–99)
HCT: 45 % (ref 39.0–52.0)
Hemoglobin: 15.3 g/dL (ref 13.0–17.0)
Potassium: 3.6 mmol/L (ref 3.5–5.1)
Sodium: 137 mmol/L (ref 135–145)
TCO2: 24 mmol/L (ref 22–32)

## 2024-09-13 LAB — ETHANOL: Alcohol, Ethyl (B): <15 mg/dL

## 2024-09-13 LAB — CBG MONITORING, ED: Glucose-Capillary: 135 mg/dL — ABNORMAL HIGH (ref 70–99)

## 2024-09-13 MED ORDER — PROCHLORPERAZINE EDISYLATE 10 MG/2ML IJ SOLN
10.0000 mg | Freq: Once | INTRAMUSCULAR | Status: AC
  Administered 2024-09-13: 10 mg via INTRAMUSCULAR
  Filled 2024-09-13: qty 2

## 2024-09-13 MED ORDER — DIPHENHYDRAMINE-ZINC ACETATE 2-0.1 % EX CREA: TOPICAL_CREAM | Freq: Every day | CUTANEOUS | Status: AC | PRN

## 2024-09-13 NOTE — Code Documentation (Addendum)
 Stroke Response Nurse Documentation Code Documentation  Lawrence [name] is a 69 y.o. male arriving to [facility]  via [place] [facility] on 09/13/2024 with past medical hx of CHF, HTN, CAD, afib on eliquis , prior L cerebellar stroke. On Eliquis  (apixaban ) daily. Code stroke was activated by EMS.   Patient from [facility] where he was LKW at [phone] and now complaining of facial droop, and unresponsive to pain. Per EMS, facility staff noted the patient was in his normal state of health at 1718, approximately 20 minutes later he was noted to be unresponsive with a facial droop.  Stroke team at the bedside on patient arrival. Labs drawn and patient cleared for CT by Dr. Cottie. Patient to CT with team. NIHSS 24, see documentation for details and code stroke times. Patient with decreased LOC, disoriented, not following commands, bilateral arm weakness, bilateral leg weakness, bilateral decreased sensation, Global aphasia , and dysarthria  on exam. The following imaging was completed:  CT Head. Patient is not a candidate for IV Thrombolytic due to currently taking eliquis . Patient is not a candidate for IR due to no LVO on imaging per MD.   Care Plan: VS/NIHSS q2hr x12hr, then q4hr, BP Goal <220/120, NPO until swallow screen passed.   Bedside handoff with ED RN Camie.    Annabella DELENA Bame  Stroke Response RN

## 2024-09-13 NOTE — ED Provider Notes (Signed)
 " [place] [facility] Provider Note   CSN: [id number] Arrival date & time: 09/13/24  1830  An emergency department physician performed an initial assessment on this suspected stroke patient at 65.  Patient presents with: Code Stroke   Lawrence [name] is a 69 y.o. male presenting to the ED with concern for speech difficulty.  Found at [facility] unresponsive today are not responding to voice, last known well at 5:30 PM.  Has a history of PNES, TAVR, PTSD, A-fib, on Eliquis .   HPI     Prior to Admission medications  Medication Sig Start Date End Date Taking? Authorizing Provider  acetaminophen  (TYLENOL ) 650 MG CR tablet Take 650 mg by mouth every 8 (eight) hours as needed for pain.    [provider]  albuterol  (VENTOLIN  HFA) 108 (90 Base) MCG/ACT inhaler Inhale 2 puffs into the lungs every 6 (six) hours as needed for wheezing or shortness of breath. 04/17/23   Hunsucker, Donnice SAUNDERS, MD  allopurinol  (ZYLOPRIM ) 100 MG tablet Take 1 tablet (100 mg total) by mouth daily. 07/25/23 07/24/24  Elgergawy, Brayton RAMAN, MD  apixaban  (ELIQUIS ) 5 MG TABS tablet Take 1 tablet (5 mg total) by mouth 2 (two) times daily. 04/17/23   Rolan Ezra RAMAN, MD  ARIPiprazole  (ABILIFY ) 5 MG tablet TAKE 1 TABLET BY MOUTH EVERYDAY AT BEDTIME 10/03/23   Mozingo, Regina Nattalie, NP  atorvastatin  (LIPITOR ) 40 MG tablet Take 80 mg by mouth at bedtime.    [provider]  azelastine (OPTIVAR) 0.05 % ophthalmic solution Place 1 drop into both eyes 2 (two) times daily.    [provider]  colchicine  0.6 MG tablet Take 1 tablet (0.6 mg total) by mouth daily. 07/12/23   Love, Sharlet RAMAN, PA-C  cyanocobalamin  (VITAMIN B12) 1000 MCG tablet Take 1,000 mcg by mouth daily.    [provider]  fenofibrate  (TRICOR ) 145 MG tablet Take 1 tablet (145 mg total) by mouth daily. 04/17/23   Rolan Ezra RAMAN, MD  ferrous sulfate  325 (65 FE) MG tablet Take 325 mg by mouth daily.     [provider]  fluticasone  (FLONASE ) 50 MCG/ACT nasal spray Place 2 sprays into both nostrils daily.     [provider]  HUMALOG KWIKPEN 100 UNIT/ML KwikPen Inject 2-15 Units into the skin 3 (three) times daily. 121-150 2 units  151-200 3 units  201-250 5 units  251-300 8 units  301- 350 11 units  351-400 15 units  Over 400 called MD 12/11/23   [provider]  HYDROcodone -acetaminophen  (NORCO/VICODIN) 5-325 MG tablet Take 1 tablet by mouth every 6 (six) hours as needed for severe pain (pain score 7-10). 05/08/24   Ghimire, Donalda HERO, MD  INCRUSE ELLIPTA  62.5 MCG/ACT AEPB Inhale 1 puff into the lungs daily. 12/11/23   [provider]  Lacosamide  150 MG TABS Take 1 tablet (150 mg total) by mouth 2 (two) times daily. 10/10/23   Briana Elgin DELENA, MD  levETIRAcetam  (KEPPRA ) 500 MG tablet Take 1 tablet (500 mg total) by mouth 2 (two) times daily. 05/04/23   Gregg Lek, MD  lidocaine  (ASPERCREME LIDOCAINE ) 4 % Place 1 patch onto the skin daily. Apply to right foot    [provider]  methocarbamol  (ROBAXIN ) 500 MG tablet Take 1 tablet (500 mg total) by mouth every 8 (eight) hours as needed for muscle spasms. 05/08/24   Ghimire, Donalda HERO, MD  metolazone  (ZAROXOLYN ) 2.5 MG tablet Take 2.5 mg by mouth  once a week. Every Tuesday. 09/20/23   [provider]  midodrine  (PROAMATINE ) 5 MG tablet Take 1.5 tablets (7.5 mg total) by mouth 3 (three) times daily with meals. Patient taking differently: Take 5 mg by mouth 3 (three) times daily with meals. 05/08/24   Ghimire, Donalda HERO, MD  polyethylene glycol powder (GLYCOLAX /MIRALAX ) 17 GM/SCOOP powder Mix in water as directed and take 1 capful (17 g) by mouth daily. 07/12/23   Love, Sharlet RAMAN, PA-C  prazosin  (MINIPRESS ) 1 MG capsule Take 1 mg by mouth at bedtime. 08/17/23   [provider]  pregabalin  (LYRICA ) 150 MG capsule Take 1 capsule (150 mg total) by mouth 3 (three) times daily. 10/10/23   Briana Elgin LABOR,  MD  PROTEIN PO Take 30 mLs by mouth in the morning and at bedtime.    [provider]  rOPINIRole  (REQUIP ) 0.25 MG tablet Take 1 tablet (0.25 mg total) by mouth 2 (two) times daily. 07/25/23   Elgergawy, Brayton RAMAN, MD  spironolactone  (ALDACTONE ) 25 MG tablet Take 12.5 mg by mouth daily. 09/01/23   [provider]  tirzepatide  (MOUNJARO ) 15 MG/0.5ML Pen INJECT 15 MG INTO THE SKIN ONCE A WEEK. Patient taking differently: Inject 15 mg into the skin once a week. Thursday 06/07/23   Motwani, Komal, MD  torsemide  (DEMADEX ) 20 MG tablet Take 1 tablet (20 mg total) by mouth daily. 05/08/24   Ghimire, Donalda HERO, MD  traZODone  (DESYREL ) 150 MG tablet Take 1 tablet (150 mg total) by mouth at bedtime. 12/20/23   Akula, Vijaya, MD  venlafaxine  XR (EFFEXOR -XR) 150 MG 24 hr capsule Take 150 mg by mouth daily.    [provider]    Allergies: Niacin and related; Ppd [tuberculin purified protein derivative]; Tubersol [tuberculin, ppd]; Firvanq  [vancomycin ]; and Vibramycin  [doxycycline ]    Review of Systems  Updated Vital Signs BP (!) 151/98 (BP Location: Right Arm)   Pulse 100   Temp 98.2 F (36.8 C) (Oral)   Resp 18   Wt (!) 145.4 kg   SpO2 95%   BMI 41.16 kg/m   Physical Exam Constitutional:      General: He is not in acute distress. HENT:     Head: Normocephalic and atraumatic.  Eyes:     Conjunctiva/sclera: Conjunctivae normal.     Pupils: Pupils are equal, round, and reactive to light.  Cardiovascular:     Rate and Rhythm: Normal rate and regular rhythm.  Pulmonary:     Effort: Pulmonary effort is normal. No respiratory distress.  Abdominal:     General: There is no distension.     Tenderness: There is no abdominal tenderness.  Skin:    General: Skin is warm and dry.  Neurological:     Mental Status: He is alert.     Comments: Intermittently verbalizes, no gaze deviation noted, moving all extremities to command,     (all labs ordered are listed, but only  abnormal results are displayed) Labs Reviewed  BASIC METABOLIC PANEL WITH GFR - Abnormal; Notable for the following components:      Result Value   Sodium 134 (*)    Chloride 97 (*)    Glucose, Bld 126 (*)    All other components within normal limits  CBC - Abnormal; Notable for the following components:   WBC 11.1 (*)    All other components within normal limits  CBG MONITORING, ED - Abnormal; Notable for the following components:   Glucose-Capillary 135 (*)    All other  components within normal limits  I-STAT CHEM 8, ED - Abnormal; Notable for the following components:   Glucose, Bld 127 (*)    All other components within normal limits  ETHANOL  URINE DRUG SCREEN    EKG: EKG Interpretation Date/Time:  Friday September 13 2024 18:48:01 EST Ventricular Rate:  91 PR Interval:    QRS Duration:  126 QT Interval:  352 QTC Calculation: 433 R Axis:   45  Text Interpretation: vement artifactSInus rhythm, baseline mo  Confirmed by Cottie Cough [phone]) on 09/13/2024 8:18:07 PM  Radiology: CT HEAD CODE STROKE WO CONTRAST Result Date: 09/13/2024 EXAM: CT HEAD WITHOUT CONTRAST 09/13/2024 06:42:25 PM TECHNIQUE: CT of the head was performed without the administration of intravenous contrast. Automated exposure control, iterative reconstruction, and/or weight based adjustment of the mA/kV was utilized to reduce the radiation dose to as low as reasonably achievable. COMPARISON: 06/19/2024 CLINICAL HISTORY: Acute neurological deficit; stroke suspected. FINDINGS: BRAIN AND VENTRICLES: No acute hemorrhage. No evidence of acute infarct. No hydrocephalus. No extra-axial collection. No mass effect or midline shift. Atherosclerosis of the carotid siphons. [place] Stroke Program Early CT (ASPECT) score: Ganglionic (caudate, internal capsule, lentiform nucleus, insula, M1-M3): 7. Supraganglionic (M4-M6): 3. Total: 10. SINUSES: Mucosal thickening in left sphenoid sinus. Additional scattered mucosal  thickening in the bilateral ethmoid sinuses. SOFT TISSUES AND SKULL: No acute soft tissue abnormality. No skull fracture. IMPRESSION: 1. No acute intracranial abnormality. 2. ASPECTS of 10. 3. Findings messaged to Dr. Michaela via the [facility] messaging system at 6:54 PM on 09/13/24. Electronically signed by: Cough Mania MD 09/13/2024 06:54 PM EST RP Workstation: [id number]     Procedures   Medications Ordered in the ED  diphenhydrAMINE -zinc  acetate (BENADRYL ) 2-0.1 % cream (has no administration in time range)  prochlorperazine  (COMPAZINE ) injection 10 mg (10 mg Intramuscular Given 09/13/24 2047)    Clinical Course as of 09/13/24 2248  Fri Sep 13, 2024  1959 Patient was sleeping on my reassessment.  I was able to wake him up and talk to him and he reports that he has been having a frontal headache.  He is speaking much more clearly to me now [MT]  2242 Patient was awake, conversing, appears to be back to his baseline mental status.  He is complaining of itching primarily in his legs.   [MT]    Clinical Course User Index [MT] Onisha Cedeno, Cough PARAS, MD                                 Medical Decision Making Amount and/or Complexity of Data Reviewed Labs: ordered.  Risk OTC drugs. Prescription drug management.   This patient presents to the Emergency Department with complaint of altered mental status.  This involves an extensive number of treatment options, and is a complaint that carries with it a high risk of complications and morbidity.  The differential diagnosis includes hypoglycemia vs metabolic encephalopathy vs infection (including cystitis) vs ICH vs stroke vs polypharmacy vs other  Patient evaluated as a code stroke by neurologist.  No emergent findings on CT imaging of the head.  I discussed the case with the neurologist who does feel that several aspects of the patient's neurological exam is not consistent with an acute stroke, the patient does have history of psychosomatic  disorder as well.  They are recommending a period of observation as often the patient will improve from these episodes after being observed in the ED.  In the meantime, we will proceed with checking basic labs for metabolic panel and evaluation.  Patient is able to verbalize.  Airway patent  I ordered, reviewed, and interpreted labs, including no emergent finding I ordered imaging studies which included CT head I independently visualized and interpreted imaging which showed no emergent finding and the monitor tracing which showed sinus rhythm Additional history was obtained from EMS Previous records obtained and reviewed showing prior neurology evaluation I personally reviewed the patients ECG which showed sinus rhythm with no acute ischemic findings  I consulted neurology and discussed lab and imaging findings  After the interventions stated above, I reevaluated the patient and found return back to baseline mental status.  Based on the patient's workup and neurology evaluation.  This is likely a psychosomatic episode.  He has had multiple CT scans and neuroimaging in the past for similar episodes.  He generally improves back to baseline.  I do not see indication for EEG, and I doubt that this is a seizure.  The patient is awake, drinking fluids, able to converse clearly with me.  We will return him back to his facility      Final diagnoses:  Altered mental status, unspecified altered mental status type    ED Discharge Orders     None          Theodosia Bahena, Donnice PARAS, MD 09/13/24 2249  "

## 2024-09-13 NOTE — ED Triage Notes (Signed)
 Pt BIB [facility] from [facility] for code stroke. Pt LKW 1718 by facility staff, approx 20 minutes later pt was noted to be unresponsive and with a facial droop. Pt does take Eliquis . BP 182/100 per EMS, all other VSS.

## 2024-09-13 NOTE — ED Notes (Signed)
 Patient transported to [facility] by [facility] at this time.

## 2024-09-13 NOTE — Discharge Instructions (Signed)
 Lawrence [name]'s workup, including CT scan and labs, did not have any emergencies.  He was seen by a neurologist and cleared for discharge back to the facility.  He woke up and appeared to be behaving and interacting normally at the end of his workup.

## 2024-09-13 NOTE — Consult Note (Signed)
 NEUROLOGY CONSULT NOTE   Date of service: September 13, 2024 Patient Name: Lawrence [name] MRN:  [id number] DOB:  13-Mar-1956 Chief Complaint: code stroke  Requesting Provider: Cottie Donnice PARAS, MD  History of Present Illness  BRADYN SOWARD is a 69 y.o. male with hx of anxiety and depression, asthma, BPH, CHF, A-fib on Eliquis , COPD, CAD, diabetic neuropathy, hypertension, and obesity, OSA, PTSD, hyperlipidemia, s/p TAVR, seizures versus PNES, diabetes who presents to [facility] via EMS from [facility] for acute onset of unresponsiveness.  EMS activated code stroke, last known well 1730 On arrival to [place], he is drowsy, can follow commands, has a right gaze preference however can cross midline when visual fields tested, no visual field deficit, no facial droop, moans to painful stimuli, poor effort when examining extremities, bilateral arms no antigravity movement, however when arm held over face, arm avoids hitting head, no response to painful stimuli in bilateral legs and not antigravity in bilateral legs NIHSS 24 Patient has had multiple similar presentations, with multiple brain images over the last few years., 23 CT heads, 10 CTA head and neck and 11 MRI brain   CT head with no acute process.   LKW:  1730 Modified rankin score: 5-Severe disability-bedridden, incontinent, needs constant attention IV Thrombolysis: No not a stroke  EVT:  No LVO    NIHSS components Score: Comment  1a Level of Conscious 0[]  1[x]  2[]  3[]      1b LOC Questions 0[]  1[]  2[x]       1c LOC Commands 0[]  1[]  2[x]       2 Best Gaze 0[]  1[]  2[]       3 Visual 0[]  1[]  2[]  3[]      4 Facial Palsy 0[]  1[]  2[]  3[]      5a Motor Arm - left 0[]  1[]  2[]  3[x]  4[]  UN[]    5b Motor Arm - Right 0[]  1[]  2[]  3[x]  4[]  UN[]    6a Motor Leg - Left 0[]  1[]  2[]  3[x]  4[]  UN[]    6b Motor Leg - Right 0[]  1[]  2[]  3[x]  4[]  UN[]    7 Limb Ataxia 0[]  1[]  2[]  UN[]      8 Sensory 0[]  1[]  2[x]  UN[]      9 Best Language 0[]  1[]  2[]  3[x]       10 Dysarthria 0[]  1[]  2[x]  UN[]      11 Extinct. and Inattention 0[]  1[]  2[]       TOTAL: 24      ROS  Comprehensive ROS Unable to ascertain due to AMS  Past History   Past Medical History:  Diagnosis Date   Acquired dilation of ascending aorta and aortic root    40mm by echo 01/2021   Adenomatous colon polyp 2007   Anemia    Anxiety    Asthma    BPH without obstruction/lower urinary tract symptoms 02/22/2017   Chronic diastolic (congestive) heart failure (HCC)    Chronic venous stasis 03/07/2019   COPD (chronic obstructive pulmonary disease) (HCC)    Coronary artery calcification seen on CAT scan    Depression    Diabetic neuropathy (HCC) 09/11/2019   History of colon polyps 08/24/2018   Hypertension    Morbid obesity (HCC)    OSA (obstructive sleep apnea)    Pain due to onychomycosis of toenails of both feet 09/11/2019   Peripheral neuropathy 02/22/2017   Primary osteoarthritis, left shoulder 03/05/2017   PTSD (post-traumatic stress disorder)    Pure hypercholesterolemia    QT prolongation 03/07/2019   S/P TAVR (transcatheter aortic valve replacement)  02/07/2023   34mm Evolut FX via TF approach with Dr. Wendel and Dr. Lucas   Seizures [facility])    Severe aortic stenosis    Sinus tachycardia 03/07/2019   Sleep apnea    CPAP   Type 2 diabetes mellitus with vascular disease (HCC) 09/11/2019    Past Surgical History:  Procedure Laterality Date   ENDOVENOUS ABLATION SAPHENOUS VEIN W/ LASER Right 08/20/2020   endovenous laser ablation right greater saphenous vein by Medford Blade MD    ENDOVENOUS ABLATION SAPHENOUS VEIN W/ LASER Left 11/16/2022   endovenous laser ablation left greater saphenous vein by Medford Blade MD   INTRAOPERATIVE TRANSTHORACIC ECHOCARDIOGRAM N/A 02/07/2023   Procedure: INTRAOPERATIVE TRANSTHORACIC ECHOCARDIOGRAM;  Surgeon: Wendel Lurena POUR, MD;  Location: [facility] INVASIVE CV LAB;  Service: Open Heart Surgery;  Laterality: N/A;   JOINT REPLACEMENT      left knee replacement x 2   KNEE ARTHROSCOPY Bilateral    LEFT HEART CATH AND CORONARY ANGIOGRAPHY N/A 01/17/2018   Procedure: LEFT HEART CATH AND CORONARY ANGIOGRAPHY;  Surgeon: Anner Alm ORN, MD;  Location: [facility] INVASIVE CV LAB;  Service: Cardiovascular;  Laterality: N/A;   PRESSURE SENSOR/CARDIOMEMS N/A 08/26/2021   Procedure: PRESSURE SENSOR/CARDIOMEMS;  Surgeon: Rolan Ezra RAMAN, MD;  Location: [facility] INVASIVE CV LAB;  Service: Cardiovascular;  Laterality: N/A;   RIGHT HEART CATH N/A 08/26/2021   Procedure: RIGHT HEART CATH;  Surgeon: Rolan Ezra RAMAN, MD;  Location: [facility] INVASIVE CV LAB;  Service: Cardiovascular;  Laterality: N/A;   RIGHT HEART CATH AND CORONARY ANGIOGRAPHY N/A 01/26/2023   Procedure: RIGHT HEART CATH AND CORONARY ANGIOGRAPHY;  Surgeon: Wendel Lurena POUR, MD;  Location: [facility] INVASIVE CV LAB;  Service: Cardiovascular;  Laterality: N/A;   TEE WITHOUT CARDIOVERSION N/A 08/26/2021   Procedure: TRANSESOPHAGEAL ECHOCARDIOGRAM (TEE);  Surgeon: Rolan Ezra RAMAN, MD;  Location: [facility] ENDOSCOPY;  Service: Cardiovascular;  Laterality: N/A;   TOOTH EXTRACTION N/A 02/03/2023   Procedure: DENTAL RESTORATION/EXTRACTIONS;  Surgeon: Sheryle Hamilton, DMD;  Location: [facility] OR;  Service: Oral Surgery;  Laterality: N/A;   TRANSCATHETER AORTIC VALVE REPLACEMENT, TRANSFEMORAL Right 02/07/2023   Procedure: Transcatheter Aortic Valve Replacement, Transfemoral;  Surgeon: Wendel Lurena POUR, MD;  Location: [facility] INVASIVE CV LAB;  Service: Open Heart Surgery;  Laterality: Right;   TRANSESOPHAGEAL ECHOCARDIOGRAM (CATH LAB) N/A 12/07/2023   Procedure: TRANSESOPHAGEAL ECHOCARDIOGRAM;  Surgeon: Santo Stanly LABOR, MD;  Location: [facility] INVASIVE CV LAB;  Service: Cardiovascular;  Laterality: N/A;   UMBILICAL HERNIA REPAIR      Family History: Family History  Problem Relation Age of Onset   CAD Maternal Grandfather    Diabetes Other    Diabetes Mellitus II Neg Hx    Colon cancer Neg Hx    Esophageal cancer Neg Hx     Inflammatory bowel disease Neg Hx    Liver disease Neg Hx    Pancreatic cancer Neg Hx    Rectal cancer Neg Hx    Stomach cancer Neg Hx    Sleep apnea Neg Hx     Social History  reports that he quit smoking about 8 years ago. His smoking use included cigarettes. He started smoking about 52 years ago. He has a 44 pack-year smoking history. He has been exposed to tobacco smoke. He has never used smokeless tobacco. He reports current alcohol  use. He reports that he does not use drugs.  Allergies[1]  Medications  Current Medications[2]  Vitals   Vitals:   09/13/24 1800  Weight: (!) 145.4 kg    Body  mass index is 41.16 kg/m.   Physical Exam   Constitutional: Appears well-developed and well-nourished.  Psych: Affect appropriate to situation.  Eyes: No scleral injection.  HENT: No OP obstruction.  Head: Normocephalic.  Cardiovascular: Normal rate and regular rhythm.  Respiratory: Effort normal, non-labored breathing.  GI: Soft.  No distension. There is no tenderness.  Skin: WDI.   Neurologic Examination  he is drowsy, can follow commands, has a right gaze preference however can cross midline when visual fields tested, no visual field deficit, no facial droop, moans to painful stimuli, poor effort when examining extremities, bilateral arms no antigravity movement, however when arm held over face, arm avoids hitting head, moans to painful stimuli in bilateral uppers, non movement, no response to painful stimuli in bilateral legs and not antigravity in bilateral legs   Labs/Imaging/Neurodiagnostic studies   CBC:  Recent Labs  Lab 2024-09-27 1837  HGB 15.3  HCT 45.0   Basic Metabolic Panel:  Lab Results  Component Value Date   NA 137 09/27/24   K 3.6 09/27/2024   CO2 27 06/19/2024   GLUCOSE 127 (H) 09/27/2024   BUN 16 Sep 27, 2024   CREATININE 1.10 2024-09-27   CALCIUM  8.8 (L) 06/19/2024   GFRNONAA >60 06/19/2024   GFRAA 77 05/20/2020   Lipid Panel:  Lab Results   Component Value Date   LDLCALC 107 (H) 12/17/2023   HgbA1c:  Lab Results  Component Value Date   HGBA1C 7.0 (H) 12/18/2023   Urine Drug Screen:     Component Value Date/Time   LABOPIA POSITIVE (A) 05/02/2024 0505   COCAINSCRNUR NONE DETECTED 05/02/2024 0505   LABBENZ NONE DETECTED 05/02/2024 0505   AMPHETMU NONE DETECTED 05/02/2024 0505   THCU NONE DETECTED 05/02/2024 0505   LABBARB NONE DETECTED 05/02/2024 0505    Alcohol  Level     Component Value Date/Time   ETH <15 06/19/2024 1133   INR  Lab Results  Component Value Date   INR 1.0 06/19/2024   APTT  Lab Results  Component Value Date   APTT 28 06/19/2024   AED levels:  Lab Results  Component Value Date   LEVETIRACETA 13.6 12/28/2023    CT Head without contrast(Personally reviewed):  No acute process    ASSESSMENT   Lawrence [name] is a 69 y.o. male hx of anxiety and depression, asthma, BPH, CHF, A-fib on Eliquis , COPD, CAD, diabetic neuropathy, hypertension, and obesity, OSA, PTSD, hyperlipidemia, s/p TAVR, seizures versus PNES, diabetes who [facility] for acute onset of unresponsiveness.  EMS activated code stroke, last known well 1730 Patient has had multiple similar presentations, with multiple brain images over the last few years 23 CT heads, 10 CTA head and neck and 11 MRI brains and one MR angio of the head CT head with no acute process.  RECOMMENDATIONS  If patient mental status does not improve could consider doing EEG to evaluate for seizure, however low likelihood this is seizure  Neurology will be available as needed  ______________________________________________________________________   Signed, Karna DELENA Geralds, NP [facility]   I have seen the patient and reviewed the above note.  He does appear to have a rightward gaze deviation, however he does cross midline to the left with encouragement such as distracting maneuvers or asking to  count fingers which he then tries to look towards.  He is able to count fingers in all fields.  He has flaccid hemiparesis of all four extremities however when  his arm is held in front of his face he clearly avoids hitting his face.  He does follow commands in bilateral legs as well.  He has had multiple similar presentations in the past, always with negative workup.  I do not think cerebrovascular disease as an etiology of the spells is a reasonable consideration and do not think CTA or MRI is indicated.  Seizures have been proposed as a possible etiology in the past, and my suspicion for this is also very low given the clearly nonorganic findings on exam, however if his symptoms do not improve, could consider EEG.  His symptoms have been exhaustively evaluated by us  in the past, and I do not have any further workup to recommend.  Neurology will be available if an EEG is performed and shows clear and obvious seizure activity, but I do not think this is really necessary based on exam.  Please call with further questions or concerns.  Aisha Seals, MD [facility]   If 7pm- 7am, please page neurology on call as listed in AMION.      [1]  Allergies Allergen Reactions   Niacin And Related Other (See Comments)    Redness, flushing   Ppd [Tuberculin Purified Protein Derivative] Other (See Comments)    Unknown rxn (MAR)   Tubersol [Tuberculin, Ppd] Other (See Comments)    Unknown rxn (MAR)   Firvanq  [Vancomycin ] Other (See Comments)    Red man syndrome   Vibramycin  [Doxycycline ] Rash  [2] No current facility-administered medications for this encounter.  Current Outpatient Medications:    acetaminophen  (TYLENOL ) 650 MG CR tablet, Take 650 mg by mouth every 8 (eight) hours as needed for pain., Disp: , Rfl:    albuterol  (VENTOLIN  HFA) 108 (90 Base) MCG/ACT inhaler, Inhale 2 puffs into the lungs every 6 (six) hours as needed for wheezing or shortness of breath., Disp: 1 each,  Rfl: 6   allopurinol  (ZYLOPRIM ) 100 MG tablet, Take 1 tablet (100 mg total) by mouth daily., Disp: , Rfl:    apixaban  (ELIQUIS ) 5 MG TABS tablet, Take 1 tablet (5 mg total) by mouth 2 (two) times daily., Disp: 180 tablet, Rfl: 3   ARIPiprazole  (ABILIFY ) 5 MG tablet, TAKE 1 TABLET BY MOUTH EVERYDAY AT BEDTIME, Disp: 60 tablet, Rfl: 2   atorvastatin  (LIPITOR ) 40 MG tablet, Take 80 mg by mouth at bedtime., Disp: , Rfl:    azelastine (OPTIVAR) 0.05 % ophthalmic solution, Place 1 drop into both eyes 2 (two) times daily., Disp: , Rfl:    colchicine  0.6 MG tablet, Take 1 tablet (0.6 mg total) by mouth daily., Disp: 30 tablet, Rfl: 0   cyanocobalamin  (VITAMIN B12) 1000 MCG tablet, Take 1,000 mcg by mouth daily., Disp: , Rfl:    fenofibrate  (TRICOR ) 145 MG tablet, Take 1 tablet (145 mg total) by mouth daily., Disp: 90 tablet, Rfl: 3   ferrous sulfate  325 (65 FE) MG tablet, Take 325 mg by mouth daily., Disp: , Rfl:    fluticasone  (FLONASE ) 50 MCG/ACT nasal spray, Place 2 sprays into both nostrils daily. , Disp: , Rfl:    HUMALOG KWIKPEN 100 UNIT/ML KwikPen, Inject 2-15 Units into the skin 3 (three) times daily. 121-150 2 units  151-200 3 units  201-250 5 units  251-300 8 units  301- 350 11 units  351-400 15 units  Over 400 called MD, Disp: , Rfl:    HYDROcodone -acetaminophen  (NORCO/VICODIN) 5-325 MG tablet, Take 1 tablet by mouth every 6 (six) hours as needed for severe pain (  pain score 7-10)., Disp: 10 tablet, Rfl: 0   INCRUSE ELLIPTA  62.5 MCG/ACT AEPB, Inhale 1 puff into the lungs daily., Disp: , Rfl:    Lacosamide  150 MG TABS, Take 1 tablet (150 mg total) by mouth 2 (two) times daily., Disp: 10 tablet, Rfl: 0   levETIRAcetam  (KEPPRA ) 500 MG tablet, Take 1 tablet (500 mg total) by mouth 2 (two) times daily., Disp: 180 tablet, Rfl: 3   lidocaine  (ASPERCREME LIDOCAINE ) 4 %, Place 1 patch onto the skin daily. Apply to right foot, Disp: , Rfl:    methocarbamol  (ROBAXIN ) 500 MG tablet, Take 1 tablet (500 mg  total) by mouth every 8 (eight) hours as needed for muscle spasms., Disp: , Rfl:    metolazone  (ZAROXOLYN ) 2.5 MG tablet, Take 2.5 mg by mouth once a week. Every Tuesday., Disp: , Rfl:    midodrine  (PROAMATINE ) 5 MG tablet, Take 1.5 tablets (7.5 mg total) by mouth 3 (three) times daily with meals. (Patient taking differently: Take 5 mg by mouth 3 (three) times daily with meals.), Disp: , Rfl:    polyethylene glycol powder (GLYCOLAX /MIRALAX ) 17 GM/SCOOP powder, Mix in water as directed and take 1 capful (17 g) by mouth daily., Disp: 238 g, Rfl: 0   prazosin  (MINIPRESS ) 1 MG capsule, Take 1 mg by mouth at bedtime., Disp: , Rfl:    pregabalin  (LYRICA ) 150 MG capsule, Take 1 capsule (150 mg total) by mouth 3 (three) times daily., Disp: 15 capsule, Rfl: 0   PROTEIN PO, Take 30 mLs by mouth in the morning and at bedtime., Disp: , Rfl:    rOPINIRole  (REQUIP ) 0.25 MG tablet, Take 1 tablet (0.25 mg total) by mouth 2 (two) times daily., Disp: , Rfl:    spironolactone  (ALDACTONE ) 25 MG tablet, Take 12.5 mg by mouth daily., Disp: , Rfl:    tirzepatide  (MOUNJARO ) 15 MG/0.5ML Pen, INJECT 15 MG INTO THE SKIN ONCE A WEEK. (Patient taking differently: Inject 15 mg into the skin once a week. Thursday), Disp: 6 mL, Rfl: 0   torsemide  (DEMADEX ) 20 MG tablet, Take 1 tablet (20 mg total) by mouth daily., Disp: , Rfl:    traZODone  (DESYREL ) 150 MG tablet, Take 1 tablet (150 mg total) by mouth at bedtime., Disp: 30 tablet, Rfl: 0   venlafaxine  XR (EFFEXOR -XR) 150 MG 24 hr capsule, Take 150 mg by mouth daily., Disp: , Rfl:

## 2024-09-13 NOTE — ED Notes (Signed)
 Wife called per request and pt speaking with her. PTAR  at bedside to get pt

## 2024-10-02 ENCOUNTER — Ambulatory Visit: Admitting: Orthopaedic Surgery

## 2025-03-03 ENCOUNTER — Ambulatory Visit: Admitting: Neurology
